# Patient Record
Sex: Female | Born: 1945
Health system: Southern US, Community
[De-identification: ages and names within clinical notes are randomized; demographics above are authoritative.]

## PROBLEM LIST (undated history)

## (undated) DIAGNOSIS — I209 Angina pectoris, unspecified: Secondary | ICD-10-CM

## (undated) DIAGNOSIS — K439 Ventral hernia without obstruction or gangrene: Secondary | ICD-10-CM

## (undated) DIAGNOSIS — M545 Low back pain, unspecified: Secondary | ICD-10-CM

## (undated) DIAGNOSIS — R0602 Shortness of breath: Secondary | ICD-10-CM

## (undated) DIAGNOSIS — R413 Other amnesia: Secondary | ICD-10-CM

## (undated) DIAGNOSIS — I219 Acute myocardial infarction, unspecified: Secondary | ICD-10-CM

## (undated) DIAGNOSIS — G8929 Other chronic pain: Secondary | ICD-10-CM

## (undated) DIAGNOSIS — C4491 Basal cell carcinoma of skin, unspecified: Secondary | ICD-10-CM

## (undated) DIAGNOSIS — R252 Cramp and spasm: Secondary | ICD-10-CM

## (undated) DIAGNOSIS — K219 Gastro-esophageal reflux disease without esophagitis: Secondary | ICD-10-CM

## (undated) DIAGNOSIS — Z9981 Dependence on supplemental oxygen: Secondary | ICD-10-CM

## (undated) DIAGNOSIS — R011 Cardiac murmur, unspecified: Secondary | ICD-10-CM

## (undated) DIAGNOSIS — Z9889 Other specified postprocedural states: Secondary | ICD-10-CM

## (undated) DIAGNOSIS — I639 Cerebral infarction, unspecified: Secondary | ICD-10-CM

## (undated) DIAGNOSIS — M1990 Unspecified osteoarthritis, unspecified site: Secondary | ICD-10-CM

## (undated) DIAGNOSIS — I739 Peripheral vascular disease, unspecified: Secondary | ICD-10-CM

## (undated) DIAGNOSIS — F40240 Claustrophobia: Secondary | ICD-10-CM

## (undated) DIAGNOSIS — I10 Essential (primary) hypertension: Secondary | ICD-10-CM

## (undated) DIAGNOSIS — G43009 Migraine without aura, not intractable, without status migrainosus: Secondary | ICD-10-CM

## (undated) DIAGNOSIS — R269 Unspecified abnormalities of gait and mobility: Secondary | ICD-10-CM

## (undated) DIAGNOSIS — E785 Hyperlipidemia, unspecified: Secondary | ICD-10-CM

## (undated) DIAGNOSIS — I447 Left bundle-branch block, unspecified: Secondary | ICD-10-CM

## (undated) DIAGNOSIS — J189 Pneumonia, unspecified organism: Secondary | ICD-10-CM

## (undated) DIAGNOSIS — I499 Cardiac arrhythmia, unspecified: Secondary | ICD-10-CM

## (undated) DIAGNOSIS — Z8719 Personal history of other diseases of the digestive system: Secondary | ICD-10-CM

## (undated) DIAGNOSIS — R1110 Vomiting, unspecified: Secondary | ICD-10-CM

## (undated) DIAGNOSIS — F32A Depression, unspecified: Secondary | ICD-10-CM

## (undated) DIAGNOSIS — G709 Myoneural disorder, unspecified: Secondary | ICD-10-CM

## (undated) DIAGNOSIS — E669 Obesity, unspecified: Secondary | ICD-10-CM

## (undated) DIAGNOSIS — R519 Headache, unspecified: Secondary | ICD-10-CM

## (undated) DIAGNOSIS — G43909 Migraine, unspecified, not intractable, without status migrainosus: Secondary | ICD-10-CM

## (undated) DIAGNOSIS — G4733 Obstructive sleep apnea (adult) (pediatric): Secondary | ICD-10-CM

## (undated) DIAGNOSIS — I2510 Atherosclerotic heart disease of native coronary artery without angina pectoris: Secondary | ICD-10-CM

## (undated) DIAGNOSIS — I509 Heart failure, unspecified: Secondary | ICD-10-CM

## (undated) DIAGNOSIS — F419 Anxiety disorder, unspecified: Secondary | ICD-10-CM

## (undated) DIAGNOSIS — N189 Chronic kidney disease, unspecified: Secondary | ICD-10-CM

## (undated) DIAGNOSIS — K920 Hematemesis: Secondary | ICD-10-CM

## (undated) DIAGNOSIS — F329 Major depressive disorder, single episode, unspecified: Secondary | ICD-10-CM

## (undated) DIAGNOSIS — E119 Type 2 diabetes mellitus without complications: Secondary | ICD-10-CM

## (undated) DIAGNOSIS — R112 Nausea with vomiting, unspecified: Secondary | ICD-10-CM

## (undated) DIAGNOSIS — C439 Malignant melanoma of skin, unspecified: Secondary | ICD-10-CM

## (undated) DIAGNOSIS — R51 Headache: Secondary | ICD-10-CM

## (undated) DIAGNOSIS — D649 Anemia, unspecified: Secondary | ICD-10-CM

## (undated) DIAGNOSIS — F5104 Psychophysiologic insomnia: Secondary | ICD-10-CM

## (undated) DIAGNOSIS — J42 Unspecified chronic bronchitis: Secondary | ICD-10-CM

## (undated) HISTORY — PX: TOTAL ABDOMINAL HYSTERECTOMY: SHX209

## (undated) HISTORY — DX: Migraine without aura, not intractable, without status migrainosus: G43.009

## (undated) HISTORY — DX: Vomiting, unspecified: R11.10

## (undated) HISTORY — PX: APPENDECTOMY: SHX54

## (undated) HISTORY — PX: CORONARY ARTERY BYPASS GRAFT: SHX141

## (undated) HISTORY — DX: Atherosclerotic heart disease of native coronary artery without angina pectoris: I25.10

## (undated) HISTORY — PX: OVARY SURGERY: SHX727

## (undated) HISTORY — DX: Other chronic pain: G89.29

## (undated) HISTORY — DX: Unspecified osteoarthritis, unspecified site: M19.90

## (undated) HISTORY — DX: Psychophysiologic insomnia: F51.04

## (undated) HISTORY — DX: Ventral hernia without obstruction or gangrene: K43.9

## (undated) HISTORY — PX: FRACTURE SURGERY: SHX138

## (undated) HISTORY — DX: Gastro-esophageal reflux disease without esophagitis: K21.9

## (undated) HISTORY — DX: Hematemesis: K92.0

## (undated) HISTORY — DX: Cardiac arrhythmia, unspecified: I49.9

## (undated) HISTORY — PX: TIBIA FRACTURE SURGERY: SHX806

## (undated) HISTORY — DX: Heart failure, unspecified: I50.9

## (undated) HISTORY — DX: Hyperlipidemia, unspecified: E78.5

## (undated) HISTORY — DX: Peripheral vascular disease, unspecified: I73.9

## (undated) HISTORY — DX: Cerebral infarction, unspecified: I63.9

## (undated) HISTORY — PX: CORONARY ANGIOPLASTY WITH STENT PLACEMENT: SHX49

## (undated) HISTORY — DX: Obesity, unspecified: E66.9

## (undated) HISTORY — PX: ANKLE FRACTURE SURGERY: SHX122

## (undated) HISTORY — DX: Acute myocardial infarction, unspecified: I21.9

## (undated) HISTORY — DX: Essential (primary) hypertension: I10

## (undated) HISTORY — PX: TOOTH EXTRACTION: SUR596

## (undated) HISTORY — DX: Low back pain: M54.5

## (undated) HISTORY — DX: Unspecified abnormalities of gait and mobility: R26.9

## (undated) HISTORY — DX: Anemia, unspecified: D64.9

## (undated) HISTORY — DX: Other amnesia: R41.3

## (undated) HISTORY — DX: Obstructive sleep apnea (adult) (pediatric): G47.33

---

## 1982-02-01 HISTORY — PX: GASTRIC RESTRICTION SURGERY: SHX653

## 1999-04-06 ENCOUNTER — Inpatient Hospital Stay (HOSPITAL_COMMUNITY): Admission: RE | Admit: 1999-04-06 | Discharge: 1999-04-14 | Payer: Self-pay | Admitting: Family Medicine

## 1999-04-06 ENCOUNTER — Encounter: Payer: Self-pay | Admitting: Cardiology

## 1999-04-29 ENCOUNTER — Encounter: Admission: RE | Admit: 1999-04-29 | Discharge: 1999-07-28 | Payer: Self-pay | Admitting: Cardiology

## 1999-05-05 ENCOUNTER — Encounter (HOSPITAL_COMMUNITY): Admission: RE | Admit: 1999-05-05 | Discharge: 1999-05-22 | Payer: Self-pay | Admitting: Cardiology

## 1999-06-03 ENCOUNTER — Encounter: Payer: Self-pay | Admitting: Cardiology

## 1999-06-03 ENCOUNTER — Ambulatory Visit (HOSPITAL_COMMUNITY): Admission: RE | Admit: 1999-06-03 | Discharge: 1999-06-04 | Payer: Self-pay | Admitting: Cardiology

## 1999-06-09 ENCOUNTER — Ambulatory Visit (HOSPITAL_COMMUNITY): Admission: RE | Admit: 1999-06-09 | Discharge: 1999-06-10 | Payer: Self-pay | Admitting: Cardiology

## 1999-11-20 ENCOUNTER — Encounter: Payer: Self-pay | Admitting: Cardiology

## 1999-11-20 ENCOUNTER — Ambulatory Visit (HOSPITAL_COMMUNITY): Admission: RE | Admit: 1999-11-20 | Discharge: 1999-11-20 | Payer: Self-pay | Admitting: Cardiology

## 2000-01-06 ENCOUNTER — Ambulatory Visit (HOSPITAL_COMMUNITY): Admission: RE | Admit: 2000-01-06 | Discharge: 2000-01-07 | Payer: Self-pay | Admitting: Cardiology

## 2000-02-23 ENCOUNTER — Inpatient Hospital Stay (HOSPITAL_COMMUNITY): Admission: EM | Admit: 2000-02-23 | Discharge: 2000-02-27 | Payer: Self-pay | Admitting: Emergency Medicine

## 2000-02-23 ENCOUNTER — Encounter: Payer: Self-pay | Admitting: Emergency Medicine

## 2000-06-30 ENCOUNTER — Ambulatory Visit (HOSPITAL_COMMUNITY): Admission: RE | Admit: 2000-06-30 | Discharge: 2000-07-01 | Payer: Self-pay | Admitting: Cardiology

## 2000-06-30 ENCOUNTER — Encounter: Payer: Self-pay | Admitting: Cardiology

## 2000-10-02 HISTORY — PX: ROOT CANAL: SHX2363

## 2002-02-01 HISTORY — PX: CHOLECYSTECTOMY OPEN: SUR202

## 2002-02-01 HISTORY — PX: HERNIA REPAIR: SHX51

## 2002-03-28 ENCOUNTER — Ambulatory Visit (HOSPITAL_COMMUNITY): Admission: RE | Admit: 2002-03-28 | Discharge: 2002-03-28 | Payer: Self-pay | Admitting: Cardiology

## 2002-03-28 ENCOUNTER — Encounter: Payer: Self-pay | Admitting: Cardiology

## 2002-04-23 ENCOUNTER — Encounter: Payer: Self-pay | Admitting: Cardiology

## 2002-04-23 ENCOUNTER — Ambulatory Visit (HOSPITAL_COMMUNITY): Admission: RE | Admit: 2002-04-23 | Discharge: 2002-04-23 | Payer: Self-pay | Admitting: Cardiology

## 2002-06-21 ENCOUNTER — Encounter: Payer: Self-pay | Admitting: Family Medicine

## 2002-06-21 ENCOUNTER — Encounter: Admission: RE | Admit: 2002-06-21 | Discharge: 2002-06-21 | Payer: Self-pay | Admitting: Family Medicine

## 2002-06-28 ENCOUNTER — Encounter: Admission: RE | Admit: 2002-06-28 | Discharge: 2002-06-28 | Payer: Self-pay | Admitting: Family Medicine

## 2002-06-28 ENCOUNTER — Encounter: Payer: Self-pay | Admitting: Family Medicine

## 2002-07-24 ENCOUNTER — Encounter: Payer: Self-pay | Admitting: Surgery

## 2002-08-01 ENCOUNTER — Encounter (INDEPENDENT_AMBULATORY_CARE_PROVIDER_SITE_OTHER): Payer: Self-pay | Admitting: Specialist

## 2002-08-03 ENCOUNTER — Inpatient Hospital Stay (HOSPITAL_COMMUNITY): Admission: RE | Admit: 2002-08-03 | Discharge: 2002-08-05 | Payer: Self-pay | Admitting: Surgery

## 2002-08-10 ENCOUNTER — Inpatient Hospital Stay (HOSPITAL_COMMUNITY): Admission: EM | Admit: 2002-08-10 | Discharge: 2002-08-12 | Payer: Self-pay | Admitting: Emergency Medicine

## 2002-08-10 ENCOUNTER — Encounter: Payer: Self-pay | Admitting: Surgery

## 2002-08-11 ENCOUNTER — Encounter: Payer: Self-pay | Admitting: General Surgery

## 2002-10-10 ENCOUNTER — Encounter: Payer: Self-pay | Admitting: Surgery

## 2002-10-10 ENCOUNTER — Encounter: Admission: RE | Admit: 2002-10-10 | Discharge: 2002-10-10 | Payer: Self-pay | Admitting: Surgery

## 2002-10-26 ENCOUNTER — Encounter: Payer: Self-pay | Admitting: Family Medicine

## 2002-10-26 ENCOUNTER — Encounter: Admission: RE | Admit: 2002-10-26 | Discharge: 2002-10-26 | Payer: Self-pay | Admitting: Family Medicine

## 2003-04-10 ENCOUNTER — Ambulatory Visit (HOSPITAL_COMMUNITY): Admission: RE | Admit: 2003-04-10 | Discharge: 2003-04-11 | Payer: Self-pay | Admitting: Cardiology

## 2003-05-03 HISTORY — PX: OTHER SURGICAL HISTORY: SHX169

## 2003-05-10 ENCOUNTER — Inpatient Hospital Stay (HOSPITAL_COMMUNITY): Admission: EM | Admit: 2003-05-10 | Discharge: 2003-05-16 | Payer: Self-pay | Admitting: Emergency Medicine

## 2003-08-27 ENCOUNTER — Encounter (INDEPENDENT_AMBULATORY_CARE_PROVIDER_SITE_OTHER): Payer: Self-pay | Admitting: *Deleted

## 2003-08-27 ENCOUNTER — Inpatient Hospital Stay (HOSPITAL_COMMUNITY): Admission: RE | Admit: 2003-08-27 | Discharge: 2003-09-03 | Payer: Self-pay | Admitting: Surgery

## 2003-11-11 ENCOUNTER — Encounter: Admission: RE | Admit: 2003-11-11 | Discharge: 2003-11-11 | Payer: Self-pay | Admitting: Family Medicine

## 2003-12-27 ENCOUNTER — Ambulatory Visit: Payer: Self-pay | Admitting: Internal Medicine

## 2004-01-01 ENCOUNTER — Ambulatory Visit (HOSPITAL_COMMUNITY): Admission: RE | Admit: 2004-01-01 | Discharge: 2004-01-01 | Payer: Self-pay | Admitting: Internal Medicine

## 2004-03-03 ENCOUNTER — Ambulatory Visit: Payer: Self-pay | Admitting: Internal Medicine

## 2004-03-04 ENCOUNTER — Ambulatory Visit: Payer: Self-pay | Admitting: Internal Medicine

## 2004-03-04 ENCOUNTER — Ambulatory Visit: Payer: Self-pay | Admitting: Cardiology

## 2004-03-18 ENCOUNTER — Ambulatory Visit: Payer: Self-pay | Admitting: Internal Medicine

## 2004-03-31 ENCOUNTER — Ambulatory Visit: Payer: Self-pay | Admitting: *Deleted

## 2004-04-13 ENCOUNTER — Encounter (HOSPITAL_COMMUNITY): Admission: RE | Admit: 2004-04-13 | Discharge: 2004-07-12 | Payer: Self-pay | Admitting: Cardiology

## 2004-04-21 ENCOUNTER — Ambulatory Visit: Payer: Self-pay | Admitting: Cardiology

## 2004-05-01 ENCOUNTER — Ambulatory Visit: Payer: Self-pay | Admitting: Cardiology

## 2004-05-05 ENCOUNTER — Ambulatory Visit (HOSPITAL_COMMUNITY): Admission: RE | Admit: 2004-05-05 | Discharge: 2004-05-05 | Payer: Self-pay | Admitting: Cardiology

## 2004-05-05 ENCOUNTER — Ambulatory Visit: Payer: Self-pay | Admitting: Cardiology

## 2004-05-06 ENCOUNTER — Ambulatory Visit: Payer: Self-pay | Admitting: Internal Medicine

## 2004-05-15 ENCOUNTER — Ambulatory Visit (HOSPITAL_COMMUNITY): Admission: RE | Admit: 2004-05-15 | Discharge: 2004-05-15 | Payer: Self-pay | Admitting: Internal Medicine

## 2004-05-20 ENCOUNTER — Ambulatory Visit: Payer: Self-pay | Admitting: Cardiology

## 2004-07-03 ENCOUNTER — Ambulatory Visit: Payer: Self-pay | Admitting: Cardiology

## 2004-07-13 ENCOUNTER — Encounter (HOSPITAL_COMMUNITY): Admission: RE | Admit: 2004-07-13 | Discharge: 2004-09-01 | Payer: Self-pay | Admitting: Cardiology

## 2004-08-05 ENCOUNTER — Ambulatory Visit: Payer: Self-pay | Admitting: Internal Medicine

## 2004-08-13 ENCOUNTER — Ambulatory Visit: Payer: Self-pay | Admitting: Internal Medicine

## 2004-08-19 ENCOUNTER — Ambulatory Visit: Payer: Self-pay | Admitting: Cardiology

## 2004-08-25 ENCOUNTER — Ambulatory Visit: Payer: Self-pay | Admitting: Pulmonary Disease

## 2004-09-17 ENCOUNTER — Ambulatory Visit: Payer: Self-pay | Admitting: Cardiology

## 2004-09-22 ENCOUNTER — Ambulatory Visit: Payer: Self-pay | Admitting: Pulmonary Disease

## 2004-11-19 ENCOUNTER — Ambulatory Visit: Payer: Self-pay | Admitting: Cardiology

## 2005-01-11 ENCOUNTER — Ambulatory Visit: Payer: Self-pay | Admitting: Cardiology

## 2005-01-21 ENCOUNTER — Inpatient Hospital Stay (HOSPITAL_COMMUNITY): Admission: AD | Admit: 2005-01-21 | Discharge: 2005-01-30 | Payer: Self-pay | Admitting: Cardiology

## 2005-01-21 ENCOUNTER — Ambulatory Visit: Payer: Self-pay | Admitting: Cardiology

## 2005-01-26 ENCOUNTER — Ambulatory Visit: Payer: Self-pay | Admitting: Cardiology

## 2005-01-27 ENCOUNTER — Ambulatory Visit: Payer: Self-pay | Admitting: Cardiology

## 2005-01-27 ENCOUNTER — Encounter: Payer: Self-pay | Admitting: Cardiology

## 2005-02-04 ENCOUNTER — Ambulatory Visit: Payer: Self-pay

## 2005-02-18 ENCOUNTER — Ambulatory Visit: Payer: Self-pay | Admitting: Internal Medicine

## 2005-02-25 ENCOUNTER — Ambulatory Visit: Payer: Self-pay | Admitting: Cardiology

## 2005-03-02 ENCOUNTER — Ambulatory Visit: Payer: Self-pay | Admitting: Cardiology

## 2005-03-29 ENCOUNTER — Ambulatory Visit: Payer: Self-pay | Admitting: Cardiology

## 2005-05-25 ENCOUNTER — Ambulatory Visit: Payer: Self-pay | Admitting: Internal Medicine

## 2005-06-30 ENCOUNTER — Ambulatory Visit: Payer: Self-pay | Admitting: Internal Medicine

## 2005-08-31 ENCOUNTER — Ambulatory Visit: Payer: Self-pay | Admitting: Cardiology

## 2005-11-15 ENCOUNTER — Ambulatory Visit: Payer: Self-pay | Admitting: Cardiology

## 2005-12-03 ENCOUNTER — Ambulatory Visit: Payer: Self-pay | Admitting: Cardiology

## 2006-01-17 ENCOUNTER — Ambulatory Visit: Payer: Self-pay | Admitting: Cardiology

## 2006-03-02 ENCOUNTER — Encounter: Admission: RE | Admit: 2006-03-02 | Discharge: 2006-03-02 | Payer: Self-pay | Admitting: Family Medicine

## 2006-03-04 ENCOUNTER — Ambulatory Visit: Payer: Self-pay | Admitting: Cardiology

## 2006-06-07 ENCOUNTER — Ambulatory Visit: Payer: Self-pay | Admitting: Cardiology

## 2006-09-29 ENCOUNTER — Ambulatory Visit: Payer: Self-pay | Admitting: Cardiology

## 2006-11-24 ENCOUNTER — Ambulatory Visit: Payer: Self-pay | Admitting: Cardiology

## 2006-12-14 ENCOUNTER — Ambulatory Visit: Payer: Self-pay | Admitting: Cardiology

## 2006-12-19 ENCOUNTER — Ambulatory Visit: Payer: Self-pay | Admitting: Cardiology

## 2006-12-19 LAB — CONVERTED CEMR LAB
BUN: 19 mg/dL (ref 6–23)
Basophils Absolute: 0 10*3/uL (ref 0.0–0.1)
Basophils Relative: 0.4 % (ref 0.0–1.0)
CO2: 28 meq/L (ref 19–32)
Calcium: 9.7 mg/dL (ref 8.4–10.5)
Chloride: 103 meq/L (ref 96–112)
Creatinine, Ser: 0.7 mg/dL (ref 0.4–1.2)
Eosinophils Absolute: 0.1 10*3/uL (ref 0.0–0.6)
Eosinophils Relative: 1.5 % (ref 0.0–5.0)
GFR calc Af Amer: 110 mL/min
GFR calc non Af Amer: 91 mL/min
Glucose, Bld: 209 mg/dL — ABNORMAL HIGH (ref 70–99)
HCT: 44.2 % (ref 36.0–46.0)
Hemoglobin: 15.7 g/dL — ABNORMAL HIGH (ref 12.0–15.0)
INR: 0.9 (ref 0.8–1.0)
Lymphocytes Relative: 26.8 % (ref 12.0–46.0)
MCHC: 35.4 g/dL (ref 30.0–36.0)
MCV: 92.8 fL (ref 78.0–100.0)
Monocytes Absolute: 0.5 10*3/uL (ref 0.2–0.7)
Monocytes Relative: 5.9 % (ref 3.0–11.0)
Neutro Abs: 5.8 10*3/uL (ref 1.4–7.7)
Neutrophils Relative %: 65.4 % (ref 43.0–77.0)
Platelets: 299 10*3/uL (ref 150–400)
Potassium: 3.7 meq/L (ref 3.5–5.1)
Prothrombin Time: 11.4 s (ref 10.9–13.3)
RBC: 4.76 M/uL (ref 3.87–5.11)
RDW: 12.3 % (ref 11.5–14.6)
Sodium: 140 meq/L (ref 135–145)
WBC: 8.8 10*3/uL (ref 4.5–10.5)
aPTT: 22.8 s (ref 21.7–29.8)

## 2006-12-21 ENCOUNTER — Ambulatory Visit (HOSPITAL_COMMUNITY): Admission: RE | Admit: 2006-12-21 | Discharge: 2006-12-21 | Payer: Self-pay | Admitting: Cardiology

## 2006-12-21 ENCOUNTER — Ambulatory Visit: Payer: Self-pay | Admitting: Cardiology

## 2007-01-09 ENCOUNTER — Ambulatory Visit: Payer: Self-pay | Admitting: Cardiology

## 2007-01-16 DIAGNOSIS — I2510 Atherosclerotic heart disease of native coronary artery without angina pectoris: Secondary | ICD-10-CM | POA: Insufficient documentation

## 2007-01-16 DIAGNOSIS — K219 Gastro-esophageal reflux disease without esophagitis: Secondary | ICD-10-CM | POA: Insufficient documentation

## 2007-01-16 DIAGNOSIS — G4733 Obstructive sleep apnea (adult) (pediatric): Secondary | ICD-10-CM | POA: Insufficient documentation

## 2007-01-16 DIAGNOSIS — J45909 Unspecified asthma, uncomplicated: Secondary | ICD-10-CM | POA: Insufficient documentation

## 2007-01-16 DIAGNOSIS — E118 Type 2 diabetes mellitus with unspecified complications: Secondary | ICD-10-CM | POA: Insufficient documentation

## 2007-01-16 DIAGNOSIS — K439 Ventral hernia without obstruction or gangrene: Secondary | ICD-10-CM | POA: Insufficient documentation

## 2007-01-16 DIAGNOSIS — I10 Essential (primary) hypertension: Secondary | ICD-10-CM | POA: Insufficient documentation

## 2007-01-16 DIAGNOSIS — E785 Hyperlipidemia, unspecified: Secondary | ICD-10-CM | POA: Insufficient documentation

## 2007-01-16 DIAGNOSIS — E119 Type 2 diabetes mellitus without complications: Secondary | ICD-10-CM

## 2007-01-17 ENCOUNTER — Ambulatory Visit: Payer: Self-pay | Admitting: Pulmonary Disease

## 2007-02-27 ENCOUNTER — Ambulatory Visit: Payer: Self-pay | Admitting: Cardiology

## 2007-05-23 ENCOUNTER — Ambulatory Visit: Payer: Self-pay | Admitting: Cardiology

## 2007-07-26 ENCOUNTER — Ambulatory Visit: Payer: Self-pay | Admitting: Cardiology

## 2007-10-24 DIAGNOSIS — R1115 Cyclical vomiting syndrome unrelated to migraine: Secondary | ICD-10-CM | POA: Insufficient documentation

## 2007-10-24 DIAGNOSIS — E559 Vitamin D deficiency, unspecified: Secondary | ICD-10-CM | POA: Insufficient documentation

## 2007-10-24 DIAGNOSIS — M1990 Unspecified osteoarthritis, unspecified site: Secondary | ICD-10-CM | POA: Insufficient documentation

## 2007-10-24 DIAGNOSIS — Z87898 Personal history of other specified conditions: Secondary | ICD-10-CM | POA: Insufficient documentation

## 2007-10-24 DIAGNOSIS — I252 Old myocardial infarction: Secondary | ICD-10-CM | POA: Insufficient documentation

## 2007-10-25 ENCOUNTER — Ambulatory Visit: Payer: Self-pay | Admitting: Internal Medicine

## 2007-10-30 ENCOUNTER — Ambulatory Visit: Payer: Self-pay | Admitting: Cardiology

## 2007-10-30 ENCOUNTER — Ambulatory Visit: Payer: Self-pay

## 2007-11-22 ENCOUNTER — Encounter: Payer: Self-pay | Admitting: Internal Medicine

## 2007-11-22 ENCOUNTER — Ambulatory Visit: Payer: Self-pay | Admitting: Internal Medicine

## 2007-11-23 ENCOUNTER — Encounter: Payer: Self-pay | Admitting: Internal Medicine

## 2007-11-24 ENCOUNTER — Ambulatory Visit (HOSPITAL_COMMUNITY): Admission: RE | Admit: 2007-11-24 | Discharge: 2007-11-24 | Payer: Self-pay | Admitting: Internal Medicine

## 2007-12-01 ENCOUNTER — Encounter: Payer: Self-pay | Admitting: Internal Medicine

## 2008-01-05 ENCOUNTER — Encounter: Payer: Self-pay | Admitting: Internal Medicine

## 2008-01-17 ENCOUNTER — Ambulatory Visit (HOSPITAL_COMMUNITY): Admission: RE | Admit: 2008-01-17 | Discharge: 2008-01-17 | Payer: Self-pay | Admitting: Surgery

## 2008-01-24 ENCOUNTER — Ambulatory Visit: Payer: Self-pay | Admitting: Cardiology

## 2008-05-28 ENCOUNTER — Encounter: Payer: Self-pay | Admitting: Cardiology

## 2008-07-03 ENCOUNTER — Ambulatory Visit: Payer: Self-pay | Admitting: Cardiology

## 2008-07-12 ENCOUNTER — Ambulatory Visit: Payer: Self-pay

## 2008-07-12 ENCOUNTER — Encounter: Payer: Self-pay | Admitting: Cardiology

## 2008-07-19 ENCOUNTER — Telehealth: Payer: Self-pay | Admitting: Cardiology

## 2008-08-27 ENCOUNTER — Encounter: Payer: Self-pay | Admitting: Cardiology

## 2008-09-17 ENCOUNTER — Encounter: Admission: RE | Admit: 2008-09-17 | Discharge: 2008-09-17 | Payer: Self-pay | Admitting: Surgery

## 2008-10-09 ENCOUNTER — Ambulatory Visit: Payer: Self-pay | Admitting: Cardiology

## 2008-10-09 DIAGNOSIS — M6289 Other specified disorders of muscle: Secondary | ICD-10-CM | POA: Insufficient documentation

## 2008-10-09 DIAGNOSIS — M629 Disorder of muscle, unspecified: Secondary | ICD-10-CM

## 2008-10-30 ENCOUNTER — Ambulatory Visit: Payer: Self-pay | Admitting: Cardiology

## 2008-12-11 ENCOUNTER — Telehealth (INDEPENDENT_AMBULATORY_CARE_PROVIDER_SITE_OTHER): Payer: Self-pay | Admitting: *Deleted

## 2008-12-17 ENCOUNTER — Encounter: Payer: Self-pay | Admitting: Cardiovascular Disease

## 2008-12-18 ENCOUNTER — Ambulatory Visit: Payer: Self-pay

## 2008-12-18 ENCOUNTER — Encounter: Payer: Self-pay | Admitting: Cardiovascular Disease

## 2008-12-24 ENCOUNTER — Ambulatory Visit: Payer: Self-pay | Admitting: Cardiology

## 2009-03-06 ENCOUNTER — Encounter: Payer: Self-pay | Admitting: Cardiology

## 2009-03-12 ENCOUNTER — Telehealth: Payer: Self-pay | Admitting: Cardiology

## 2009-03-31 ENCOUNTER — Ambulatory Visit: Payer: Self-pay | Admitting: Cardiology

## 2009-05-07 ENCOUNTER — Ambulatory Visit: Payer: Self-pay | Admitting: Cardiology

## 2009-06-02 ENCOUNTER — Encounter: Payer: Self-pay | Admitting: Cardiology

## 2009-06-26 ENCOUNTER — Ambulatory Visit: Payer: Self-pay | Admitting: Cardiology

## 2009-09-02 ENCOUNTER — Encounter: Payer: Self-pay | Admitting: Cardiology

## 2009-10-29 ENCOUNTER — Ambulatory Visit: Payer: Self-pay | Admitting: Cardiology

## 2009-11-03 ENCOUNTER — Encounter: Payer: Self-pay | Admitting: Cardiology

## 2009-11-04 ENCOUNTER — Encounter: Admission: RE | Admit: 2009-11-04 | Discharge: 2009-11-04 | Payer: Self-pay | Admitting: Cardiology

## 2009-11-04 ENCOUNTER — Ambulatory Visit: Payer: Self-pay | Admitting: Cardiology

## 2009-11-05 LAB — CONVERTED CEMR LAB
BUN: 19 mg/dL (ref 6–23)
Basophils Absolute: 0 10*3/uL (ref 0.0–0.1)
Basophils Relative: 0.6 % (ref 0.0–3.0)
CO2: 28 meq/L (ref 19–32)
Calcium: 9.6 mg/dL (ref 8.4–10.5)
Chloride: 105 meq/L (ref 96–112)
Creatinine, Ser: 0.6 mg/dL (ref 0.4–1.2)
Eosinophils Absolute: 0.3 10*3/uL (ref 0.0–0.7)
Eosinophils Relative: 4.3 % (ref 0.0–5.0)
GFR calc non Af Amer: 116.62 mL/min (ref >60)
Glucose, Bld: 201 mg/dL — ABNORMAL HIGH (ref 70–99)
HCT: 44.4 % (ref 36.0–46.0)
Hemoglobin: 15.4 g/dL — ABNORMAL HIGH (ref 12.0–15.0)
INR: 0.9 (ref 0.8–1.0)
Lymphocytes Relative: 30.3 % (ref 12.0–46.0)
Lymphs Abs: 2.2 10*3/uL (ref 0.7–4.0)
MCHC: 34.6 g/dL (ref 30.0–36.0)
MCV: 94.3 fL (ref 78.0–100.0)
Monocytes Absolute: 0.5 10*3/uL (ref 0.1–1.0)
Monocytes Relative: 7.1 % (ref 3.0–12.0)
Neutro Abs: 4.3 10*3/uL (ref 1.4–7.7)
Neutrophils Relative %: 57.7 % (ref 43.0–77.0)
Platelets: 231 10*3/uL (ref 150.0–400.0)
Potassium: 4.8 meq/L (ref 3.5–5.1)
Prothrombin Time: 9.8 s (ref 9.7–11.8)
RBC: 4.71 M/uL (ref 3.87–5.11)
RDW: 13 % (ref 11.5–14.6)
Sodium: 141 meq/L (ref 135–145)
WBC: 7.4 10*3/uL (ref 4.5–10.5)

## 2009-11-10 ENCOUNTER — Inpatient Hospital Stay (HOSPITAL_BASED_OUTPATIENT_CLINIC_OR_DEPARTMENT_OTHER): Admission: RE | Admit: 2009-11-10 | Discharge: 2009-11-10 | Payer: Self-pay | Admitting: Cardiology

## 2009-11-10 ENCOUNTER — Ambulatory Visit: Payer: Self-pay | Admitting: Cardiology

## 2009-11-14 ENCOUNTER — Ambulatory Visit: Payer: Self-pay | Admitting: Cardiology

## 2009-11-20 ENCOUNTER — Encounter: Payer: Self-pay | Admitting: Cardiology

## 2009-11-24 ENCOUNTER — Ambulatory Visit: Payer: Self-pay | Admitting: Cardiology

## 2009-11-27 ENCOUNTER — Telehealth: Payer: Self-pay | Admitting: Cardiology

## 2009-12-09 ENCOUNTER — Ambulatory Visit: Payer: Self-pay | Admitting: Surgery

## 2009-12-17 ENCOUNTER — Encounter: Payer: Self-pay | Admitting: Cardiology

## 2009-12-18 ENCOUNTER — Encounter: Payer: Self-pay | Admitting: Cardiology

## 2009-12-29 ENCOUNTER — Ambulatory Visit: Payer: Self-pay | Admitting: Cardiology

## 2010-01-01 DIAGNOSIS — E669 Obesity, unspecified: Secondary | ICD-10-CM

## 2010-01-01 HISTORY — DX: Obesity, unspecified: E66.9

## 2010-01-23 ENCOUNTER — Encounter: Payer: Self-pay | Admitting: Surgery

## 2010-01-23 ENCOUNTER — Telehealth (INDEPENDENT_AMBULATORY_CARE_PROVIDER_SITE_OTHER): Payer: Self-pay | Admitting: *Deleted

## 2010-01-28 ENCOUNTER — Inpatient Hospital Stay (HOSPITAL_COMMUNITY)
Admission: RE | Admit: 2010-01-28 | Discharge: 2010-02-04 | Payer: Self-pay | Source: Home / Self Care | Attending: Surgery | Admitting: Surgery

## 2010-02-04 LAB — GLUCOSE, CAPILLARY
Glucose-Capillary: 90 mg/dL (ref 70–99)
Glucose-Capillary: 118 mg/dL — ABNORMAL HIGH (ref 70–99)

## 2010-02-23 ENCOUNTER — Encounter: Payer: Self-pay | Admitting: Cardiology

## 2010-02-23 ENCOUNTER — Encounter
Admission: RE | Admit: 2010-02-23 | Discharge: 2010-02-23 | Payer: Self-pay | Source: Home / Self Care | Attending: Surgery | Admitting: Surgery

## 2010-02-23 ENCOUNTER — Ambulatory Visit
Admission: RE | Admit: 2010-02-23 | Discharge: 2010-02-23 | Payer: Self-pay | Source: Home / Self Care | Attending: Cardiology | Admitting: Cardiology

## 2010-02-23 ENCOUNTER — Ambulatory Visit
Admission: RE | Admit: 2010-02-23 | Discharge: 2010-02-23 | Payer: Self-pay | Source: Home / Self Care | Attending: Surgery | Admitting: Surgery

## 2010-03-03 NOTE — Assessment & Plan Note (Signed)
Summary: eph   Visit Type:  Follow-up Primary Provider:  Derinda Late, M.D.  CC:  Post-cath.  History of Present Illness: Back in for follow up after cath procedure. Case done from radial. Results and images reviewed in detail.  SHe continues to have chest pain, and also problems with medications from a GI perspective.  Will review options with surgical team.  Not ideal for this, but with symptoms may be an approach.   Current Medications (verified): 1)  Calcium 1500 Mg  Tabs (Calcium Carbonate) .... Take 1 Tablet By Mouth Once A Day 2)  Minoxidil 10 Mg  Tabs (Minoxidil) .... Take 1 1/2 Two Times A Day 3)  Prevalite 4 Gm  Pack (Cholestyramine Light) .... Take 2-3 Scoops Two Times A Day 4)  Metoprolol Tartrate 100 Mg Tabs (Metoprolol Tartrate) .... Take One Tablet By Mouth Twice A Day 5)  Tylenol Extra Strength 500 Mg  Tabs (Acetaminophen) .... Use As Needed 6)  Xanax 0.5 Mg  Tabs (Alprazolam) .... Take 1 Tablet By Mouth Three Times A Day 7)  Cozaar 100 Mg  Tabs (Losartan Potassium) .... Take 1 Tablet By Mouth Once A Day 8)  Lipitor 40 Mg Tabs (Atorvastatin Calcium) .Marland Kitchen.. 1 Tablet By Mouth Once Daily 9)  Lasix 80 Mg  Tabs (Furosemide) .... Take 1 Tablet By Mouth Two Times A Day As Needed 10)  Lantus 100 Unit/ml  Soln (Insulin Glargine) .Marland Kitchen.. 100 Units Daily 11)  Bayer Low Strength 81 Mg  Tbec (Aspirin) .... Take 1 Tablet By Mouth Once A Day 12)  Lisinopril 20 Mg  Tabs (Lisinopril) .... Take 1 Tablet By Mouth Two Times A Day 13)  Plavix 75 Mg  Tabs (Clopidogrel Bisulfate) .... Take 1 Tablet By Mouth Once A Day 14)  Multivitamins   Tabs (Multiple Vitamin) .... Take 1 Tablet By Mouth Once A Day 15)  Vitamin B-12 1000 Mcg  Tabs (Cyanocobalamin) .... Take 1 Tablet By Mouth Once A Day 16)  Albuterol 90 Mcg/act  Aers (Albuterol) .... Inhale Two Puffs Every Four To Six Hours As Needed 17)  Ambien 10 Mg  Tabs (Zolpidem Tartrate) .... Take 1 Tab By Mouth At Bedtime As Needed 18)  Flovent Hfa 110  Mcg/act  Aero (Fluticasone Propionate  Hfa) .... Inhale Two Puffs Two Times A Day As Needed 19)  Hydrocodone-Acetaminophen 5-500 Mg  Tabs (Hydrocodone-Acetaminophen) .... Use As Needed 20)  Mobic 7.5 Mg  Tabs (Meloxicam) .... Take 1 Tablet By Mouth Two Times A Day As Needed 21)  Migquin 325-100-65mg  .... Take Two Tabs At The Onset of A Migraine and Then One Every Hour Thereafter 22)  Clonidine Hcl 0.3 Mg Tabs (Clonidine Hcl) .... Take 1 Tablet By Mouth Three Times A Day 23)  Metolazone 5 Mg Tabs (Metolazone) .... Take 1 Tablet By Mouth Once A Day 24)  Nitroglycerin 0.4 Mg Subl (Nitroglycerin) .... One Tablet Under Tongue Every 5 Minutes As Needed For Chest Pain---May Repeat Times Three 25)  Vitamin B Complex-C   Caps (B Complex-C) .... Take 1 Capsule By Mouth Once A Day 26)  Nitro-Bid 2 % Oint (Nitroglycerin) .Marland Kitchen.. 1-2 Inches To Skin Every 6 Hours 27)  Ranitidine Hcl 150 Mg Tabs (Ranitidine Hcl) .... Take 1 Tablet By Mouth Two Times A Day 28)  Advair Diskus 500-50 Mcg/dose Aepb (Fluticasone-Salmeterol) .... One Puff Every 12 Hours 29)  Vitamin D (Ergocalciferol) 50000 Unit Caps (Ergocalciferol) .... Twice A Week 30)  Zofran 4 Mg Tabs (Ondansetron Hcl) .... Two Times  A Day As Needed  Allergies: 1)  ! Penicillin 2)  ! Erythromycin 3)  Amoxicillin (Amoxicillin)  Vital Signs:  Patient profile:   65 year old female Height:      63 inches Weight:      242.75 pounds BMI:     43.16 Pulse rate:   80 / minute Pulse rhythm:   regular Resp:     18 per minute BP sitting:   220 / 100  (right arm) Cuff size:   large  Vitals Entered By: Sidney Ace (November 14, 2009 2:47 PM)  Physical Exam  General:  Well developed, well nourished, in no acute distress. Head:  normocephalic and atraumatic Eyes:  PERRLA/EOM intact; conjunctiva and lids normal. Lungs:  Clear bilaterally to auscultation and percussion. Heart:  Paradoxical S2.   Normal S1. Extremities:  No clubbing or cyanosis.  L radial with  minimal puffiness, good pulses and refill.    EKG  Procedure date:  11/14/2009  Findings:      NSR.  LBBB.  PACs  Cardiac Cath  Procedure date:  11/10/2009  Findings:      ANGIOGRAPHIC DATA: 1. Ventriculography was done in the RAO projection.  Overall ejection     fraction was estimated around 50%.  There is an inferobasal     akinetic segment as previously noted. 2. There is moderate calcification of the coronaries. 3. The left main is free of critical disease. 4. The LAD courses to the apex.  There is a major diagonal branch and     at the major diagonal branch is about 50% irregular plaque in the     LAD itself.  This would be graded no more than 60.  There is     diffuse plaque distally with about 50% narrowing.  The major     diagonal branch has an 80% and 70% narrowing and is a modest size     vessel. 5. The circumflex provides a large marginal branch that has about 40%     leading into a previously stented area.  The stent itself has less     than about 30% narrowing distally.  There are two large branches.     The most superior of which has about 70% narrowing.  There is some     diffuse plaque in the AV circumflex which is a relatively small     caliber vessel with about 50% narrowing. 6. The right coronary artery is totally occluded proximally prior to     the stent site.  The distal vessel fills by collateralization from     the LAD.   CONCLUSION: 1. Preserved overall left ventricular function with an inferobasal     akinetic segment. 2. Total occlusion of right coronary artery as previously noted. 3. Modest plaque in the left anterior descending artery, moderately     severe plaque involving the ostium of modest size diagonal branch,     and no evidence of restenosis in the previously placed circumflex     stent.    Impression & Recommendations:  Problem # 1:  CORONARY ARTERY DISEASE (ICD-414.00) Long discussion with patient.  Stent is patent, but diffuse  disease.  Not critical.  Not ideal for CABG, and LAD is not severe.  Symptoms likely fueled by uncontrolled HTN.  See extensive workup.  Will have surgical team review for possible options.  Discussed with patient at length. Her updated medication list for this problem includes:    Metoprolol Tartrate 100  Mg Tabs (Metoprolol tartrate) .Marland Kitchen... Take one tablet by mouth twice a day    Bayer Low Strength 81 Mg Tbec (Aspirin) .Marland Kitchen... Take 1 tablet by mouth once a day    Lisinopril 20 Mg Tabs (Lisinopril) .Marland Kitchen... Take 1 tablet by mouth two times a day    Plavix 75 Mg Tabs (Clopidogrel bisulfate) .Marland Kitchen... Take 1 tablet by mouth once a day    Nitroglycerin 0.4 Mg Subl (Nitroglycerin) ..... One tablet under tongue every 5 minutes as needed for chest pain---may repeat times three    Nitro-bid 2 % Oint (Nitroglycerin) .Marland Kitchen... 1-2 inches to skin every 6 hours  Problem # 2:  HYPERTENSION (ICD-401.9) See prior discussions.  May be candidate for other procedures in near future.  Her updated medication list for this problem includes:    Minoxidil 10 Mg Tabs (Minoxidil) .Marland Kitchen... Take 1 1/2 two times a day    Metoprolol Tartrate 100 Mg Tabs (Metoprolol tartrate) .Marland Kitchen... Take one tablet by mouth twice a day    Cozaar 100 Mg Tabs (Losartan potassium) .Marland Kitchen... Take 1 tablet by mouth once a day    Lasix 80 Mg Tabs (Furosemide) .Marland Kitchen... Take 1 tablet by mouth two times a day as needed    Bayer Low Strength 81 Mg Tbec (Aspirin) .Marland Kitchen... Take 1 tablet by mouth once a day    Lisinopril 20 Mg Tabs (Lisinopril) .Marland Kitchen... Take 1 tablet by mouth two times a day    Clonidine Hcl 0.3 Mg Tabs (Clonidine hcl) .Marland Kitchen... Take 1 tablet by mouth three times a day    Metolazone 5 Mg Tabs (Metolazone) .Marland Kitchen... Take 1 tablet by mouth once a day  Patient Instructions: 1)  Your physician recommends that you schedule a follow-up appointment in: 2 WEEKS 2)  Your physician recommends that you continue on your current medications as directed. Please refer to the Current  Medication list given to you today. Prescriptions: NITROGLYCERIN 0.4 MG SUBL (NITROGLYCERIN) One tablet under tongue every 5 minutes as needed for chest pain---may repeat times three  #25 x 2   Entered by:   Theodosia Quay, RN, BSN   Authorized by:   Wadie Lessen, MD, [facility]   Signed by:   Theodosia Quay, RN, BSN on 11/14/2009   Method used:   Electronically to        Agency (retail)       [address] [place], [place]  [address]       Ph: [phone]       Fax: [phone]   RxID:   [id number]

## 2010-03-03 NOTE — Letter (Signed)
Summary: [facility] Patient Note  [facility] Patient Note   Imported By: Sallee Provencal 08/05/2009 12:25:04  _____________________________________________________________________  External Attachment:    Type:   Image     Comment:   External Document

## 2010-03-03 NOTE — Procedures (Signed)
Summary: EGD   EGD  Procedure date:  11/22/2007  Findings:      Location: [facility]    Patient Name: [name] [name], [name] [name]. [name]: [id number] Procedure Procedures: Panendoscopy (EGD) CPT: A5739879.    with biopsy(s)/brushing(s). CPT: T4586919.  Personnel: Endoscopist: Horice Carrero L. Olevia Perches, MD.  Exam Location: Exam performed in Outpatient Clinic. Outpatient  Patient Consent: Procedure, Alternatives, Risks and Benefits discussed, consent obtained, from patient. Consent was obtained by the RN.  Indications Symptoms: Nausea. Vomiting. Reflux symptoms for 6-10 yrs,  Surveillance of: 2006.  Comments: s/p banded gastroplasty for obesity in 1980, failure to loose weight, gastroparesis on Nuclear Medicine scan History  Current Medications: Patient is taking a non-steroidal medication. Patient is on an anticoagulant. Patient is not currently taking Coumadin.  Pre-Exam Physical: Performed Jun 30, 2005  Cardio-pulmonary exam, HEENT exam WNL. Abdominal exam abnormal. Extremity exam, Neurological exam, Mental status exam WNL.  Comments: Pt. history reviewed/updated, physical exam performed prior to initiation of sedation?yes Exam Exam Info: Maximum depth of insertion Duodenum, intended Duodenum. Vocal cords visualized. Gastric retroflexion performed. Images taken. ASA Classification: II. Tolerance: good.  Sedation Meds: Patient assessed and found to be appropriate for moderate (conscious) sedation. Fentanyl 100 mcg. given IV. Versed 9 mg. given IV. Cetacaine Spray 2 sprays given aerosolized.  Monitoring: BP and pulse monitoring done. Oximetry used. Supplemental O2 given  Findings - ESOPHAGEAL INFLAMMATION: suspected as a result of reflux. Severity is moderate, erosions present.  Length of inflammation: 1 cm. [place] Classification: Grade A. Biopsy/Esoph Inflamtn taken. ICD9: Esophagitis, Reflux: 530.11.  - PRIOR SURGERY: Cardia. banded gastroplasty, gastric pouch 4 cm,  ( 36-40cm) , has retained bile, see photos, gastroplasty  opening is very large easily admits the scope.  - Normal: Duodenal Bulb to Duodenal 2nd Portion.  Normal: Antrum. Comments: no retained food distal to the gastroplasty.   Assessment Abnormal examination, see findings above.  Diagnoses: 530.11: Esophagitis, Reflux.   Comments: s/p banded gastroplasty, loosened up, bile retention, reflyx esophagitis ( ? bile and acid) Events  Unplanned Intervention: No unplanned interventions were required.  Unplanned Events: There were no complications. Plans Medication(s): Await pathology. PPI: Omeprazole/Prilosec 20 mg QD, starting Nov 22, 2007  Carafate: 1gm BID, starting Nov 22, 2007   Comments: hold Mobic Disposition: After procedure patient sent to recovery. After recovery patient sent home.    cc: Derinda Late, MD  REPORT OF SURGICAL PATHOLOGY   Case #: [id number] Patient Name: [name] [name], [name] [name]. Office Chart Number:  N/A   [name]: [id number] Pathologist: H. Lynnell Chad, MD DOB/Age  [birth year] (Age: 65)    Gender: F Date Taken:  11/22/2007 Date Received: 11/22/2007   FINAL DIAGNOSIS   ***MICROSCOPIC EXAMINATION AND DIAGNOSIS***   GASTROESOPHAGEAL JUNCTION, BIOPSY:  GASTROESOPHAGEAL JUNCTION MUCOSA WITH MODERATE ACUTE AND CHRONIC INFLAMMATION CONSISTENT WITH GASTROESOPHAGEAL REFLUX.  NO INTESTINAL METAPLASIA, DYSPLASIA OR MALIGNANCY IDENTIFIED.   COMMENT An Alcian Blue stain is performed to determine the presence of intestinal metaplasia (goblet cell metaplasia). No intestinal metaplasia (goblet cell metaplasia) is identified with the Alcian Blue stain. The control stained appropriately.  Special stains (GMS and PAS) for fungal organisms will be performed and an addendum will follow.    (HCL:jy) 11/23/07   jy Date Reported:  11/23/2007     H. Lynnell Chad, MD *** Electronically Signed Out By [facility] ***   Clinical information R/O Barrett' s esophagus (jes)     specimen(s) obtained Gastroesophageal junction, biopsy   Gross Description Received in formalin are tan, soft  tissue fragments that are submitted in toto.  Number:  three Size:  0.3 to 0.5 cm One block   (MA:jes, 11/23/07)    jes/ Addendum   Addendum Comment Special stains, GMS and PAS for fungal organisms were performed. The stains are negative.  Controls stained appropriately.  (HCL:jy) 11/24/07   H. Lynnell Chad, MD   DATE REPORTED:11/24/2007     Signed by Lafayette Dragon MD on 11/24/2007 at 3:50 PM   November 23, 2007 [name]: [id number]    [facility] [address] [place], [place]  [address]    Dear Ms. Freelove,  I am pleased to inform you that the biopsies taken during your recent endoscopic examination did not show any evidence of cancer upon pathologic examination.The biopsy showed inflammation due to reflux  Additional information/recommendations:  __  _  _x_ Continue with the treatment plan as outlined on the day of your      exam.  I will call You with the results of Your small bowl x-ray.   Please call us if you are having persistent problems or have questions about your condition that have not been fully answered at this time.  Sincerely,  Lafayette Dragon MD  This letter has been electronically signed by your physician.   Signed by Lafayette Dragon MD on 11/23/2007 at 10:05 PM  ________________________________________________________________________ This report was created from the original endoscopy report, which was reviewed and signed by the above listed endoscopist.

## 2010-03-03 NOTE — Letter (Signed)
Summary: [facility]   Imported By: Jerrye Noble D'jimraou 01/23/2008 14:06:11  _____________________________________________________________________  External Attachment:    Type:   Image     Comment:   External Document

## 2010-03-03 NOTE — Assessment & Plan Note (Signed)
  [name] [name] MR#:  [id number] Page #    NAME:  [name] [name], [name] [name]  OFFICE NO:  [id number]  DATE:  05/25/05  DOB:  01-Jan-2046  HISTORY OF PRESENT ILLNESS:  The patient is a very nice 65 year old white female with cyclic vomiting and obesity, gastroesophageal reflux disease, status post remote gastric stapling in 1970 for morbid obesity. She has coronary artery disease followed by Dr. [name], status post recent percutaneous coronary intervention by Dr. [name] in December 2006. She is currently on Plavix and aspirin and will be on both medications probably for a long period of time because of the coated stent. We have done numerous upper endoscopies on her, last one on March 04, 2004.  She is here today to discuss colorectal screening. She has no family history of colon cancer, but is 65 years old and has never had colonoscopy. She denies any rectal bleeding. Her bowel habits are regular.  MEDICATIONS:  See medication list in the chart.  PHYSICAL EXAM:  Blood pressure 124/70, pulse 76, weight 262 pounds, which represents 20weight loss in last several months. This has been an intentional weight loss. She was alert, oriented. Lungs were clear to auscultation. COR with normal S1 and normal S2. Abdomen was obese, soft, with a skin ulcer in the left periumbilical area at the area where she gives herself insulin. She had ecchymosis in the other areas of insulin injections. This was a denuded superficial ulcer measuring about 5in diameter. It had no drainage, and it was not tender. Rest of the abdomen was unremarkable. There was no tenderness or mass. Rectal exam not done today. Extremities:  No edema.  IMPRESSION: 71.  A 65 year old white female. She is a good candidate for screening colonoscopy. She has never had one, but she is on Plavix and aspirin because of coated stents. She needs to stay on both anticoagulants through the procedure. 2.  Gastroesophageal reflux status post cyclic vomiting. 3.  Status  post remote gastric stapling procedure.  PLAN: 1.  Colonoscopy scheduled while on Plavix and aspirin. 2.  Zofran 4 mg, new prescription given for 3supply. 3.  Continue acid-suppressing agent for reflux. Omeprazole 20 mg, up to 40 mg a day. 4.  We gave her OsmoPrep because she is unable to tolerate large-volume liquids.      Lowella Bandy. Olevia Perches, M.D.  [id number] cc:  Derinda Late, MD  D:  05/25/05; T:  ; Job [id number]         Current Allergies: ! PENICILLIN ! ERYTHROMYCIN            ]

## 2010-03-03 NOTE — Assessment & Plan Note (Signed)
Summary: f85m  Medications Added COZAAR 100 MG  TABS (LOSARTAN POTASSIUM) Take 1 tablet by mouth once a day LANTUS 100 UNIT/ML  SOLN (INSULIN GLARGINE) 100 units daily CLONIDINE HCL 0.3 MG TABS (CLONIDINE HCL) Take 1 tablet by mouth three times a day METOLAZONE 5 MG TABS (METOLAZONE) Take 1 tablet by mouth once a day NITROGLYCERIN 0.4 MG SUBL (NITROGLYCERIN) One tablet under tongue every 5 minutes as needed for chest pain---may repeat times three VITAMIN D3 1000 UNIT CAPS (CHOLECALCIFEROL) Take 1 capsule by mouth once a day VITAMIN B COMPLEX-C   CAPS (B COMPLEX-C) Take 1 capsule by mouth once a day NITRO-BID 2 % OINT (NITROGLYCERIN) 1-2 inches to skin every 6 hours        Visit Type:  Follow-up Primary Provider:  Derinda Late, M.D.  CC:  chest pain.  History of Present Illness: No real change.  Still has some angina, and BP's remain difficult to control.  Dr. Lorrene Reid has been following her and they are using topical nitrates.  ?absorption issue.  That has been discussed with referral to [facility] and with Dr. Lorrene Reid.  No real change in anginal pattern.  See most recent cath data.  Current Medications (verified): 1)  Calcium 1500 Mg  Tabs (Calcium Carbonate) .... Take 1 Tablet By Mouth Once A Day 2)  Minoxidil 10 Mg  Tabs (Minoxidil) .... Take 1 Tablet By Mouth Three Times A Day 3)  Miacalcin 200 Unit/act Nasal Soln (Calcitonin ([place])) .... Inhale One Spray Alternating Nostrils Once A Day 4)  Prevalite 4 Gm  Pack (Cholestyramine Light) .... Take 2-3 Scoops Two Times A Day 5)  Lopressor 50 Mg  Tabs (Metoprolol Tartrate) .... Take 1 Tablet By Mouth Two Times A Day 6)  Tylenol Extra Strength 500 Mg  Tabs (Acetaminophen) .... Use As Needed 7)  Xanax 0.5 Mg  Tabs (Alprazolam) .... Take 1 Tablet By Mouth Three Times A Day 8)  Cozaar 100 Mg  Tabs (Losartan Potassium) .... Take 1 Tablet By Mouth Once A Day 9)  Lipitor 40 Mg Tabs (Atorvastatin Calcium) .Marland Kitchen.. 1 Tablet By Mouth Once  Daily 10)  Lasix 80 Mg  Tabs (Furosemide) .... Take 1 Tablet By Mouth Two Times A Day 11)  Lantus 100 Unit/ml  Soln (Insulin Glargine) .Marland Kitchen.. 100 Units Daily 12)  Bayer Low Strength 81 Mg  Tbec (Aspirin) .... Take 1 Tablet By Mouth Once A Day 13)  Lisinopril 20 Mg  Tabs (Lisinopril) .... Take 1 Tablet By Mouth Two Times A Day 14)  Omeprazole 20 Mg  Cpdr (Omeprazole) .... Take 1 Tablet By Mouth Two Times A Day 15)  Plavix 75 Mg  Tabs (Clopidogrel Bisulfate) .... Take 1 Tablet By Mouth Once A Day 16)  Multivitamins   Tabs (Multiple Vitamin) .... Take 1 Tablet By Mouth Once A Day 17)  Vitamin B-12 1000 Mcg  Tabs (Cyanocobalamin) .... Take 1 Tablet By Mouth Once A Day 18)  Albuterol 90 Mcg/act  Aers (Albuterol) .... Inhale Two Puffs Every Four To Six Hours As Needed 19)  Ambien 10 Mg  Tabs (Zolpidem Tartrate) .... Take 1 Tab By Mouth At Bedtime As Needed 20)  Flovent Hfa 110 Mcg/act  Aero (Fluticasone Propionate  Hfa) .... Inhale Two Puffs Two Times A Day As Needed 21)  Hydrocodone-Acetaminophen 5-500 Mg  Tabs (Hydrocodone-Acetaminophen) .... Use As Needed 22)  Mobic 7.5 Mg  Tabs (Meloxicam) .... Take 1 Tablet By Mouth Two Times A Day As Needed 23)  Migquin  325-100-65mg  .... Take Two Tabs At The Onset of A Migraine and Then One Every Hour Thereafter 24)  Clonidine Hcl 0.3 Mg Tabs (Clonidine Hcl) .... Take 1 Tablet By Mouth Three Times A Day 25)  Metolazone 5 Mg Tabs (Metolazone) .... Take 1 Tablet By Mouth Once A Day 26)  Nitroglycerin 0.4 Mg Subl (Nitroglycerin) .... One Tablet Under Tongue Every 5 Minutes As Needed For Chest Pain---May Repeat Times Three 27)  Vitamin D3 1000 Unit Caps (Cholecalciferol) .... Take 1 Capsule By Mouth Once A Day 28)  Vitamin B Complex-C   Caps (B Complex-C) .... Take 1 Capsule By Mouth Once A Day 29)  Nitro-Bid 2 % Oint (Nitroglycerin) .Marland Kitchen.. 1-2 Inches To Skin Every 6 Hours  Allergies: 1)  ! Penicillin 2)  ! Erythromycin  Vital Signs:  Patient profile:   65 year  old female Height:      63 inches Weight:      248.25 pounds BMI:     44.13 Pulse rate:   72 / minute Pulse rhythm:   regular Resp:     18 per minute BP sitting:   220 / 100  (left arm) Cuff size:   large  Vitals Entered By: Sidney Ace (July 03, 2008 4:47 PM)  Physical Exam  General:  Well developed, well nourished, in no acute distress. Head:  normocephalic and atraumatic Lungs:  Clear bilaterally to auscultation and percussion. Heart:  Normal S1 and S2. Positive S4.  No significant murmur. Extremities:  No clubbing or cyanosis.  No edema.   EKG  Procedure date:  07/03/2008  Findings:      NSR.  New LBBB since last office visit.  Impression & Recommendations:  Problem # 1:  CORONARY ARTERY DISEASE (ICD-414.00) No real change in anginal pattern.  Does have new LBBB from last office visit.  Will check an echo to evaluate LV function.   The following medications were removed from the medication list:    Nitroglycerin 0.4 Mg Subl (Nitroglycerin) ..... Use as needed Her updated medication list for this problem includes:    Lopressor 50 Mg Tabs (Metoprolol tartrate) .Marland Kitchen... Take 1 tablet by mouth two times a day    Bayer Low Strength 81 Mg Tbec (Aspirin) .Marland Kitchen... Take 1 tablet by mouth once a day    Lisinopril 20 Mg Tabs (Lisinopril) .Marland Kitchen... Take 1 tablet by mouth two times a day    Plavix 75 Mg Tabs (Clopidogrel bisulfate) .Marland Kitchen... Take 1 tablet by mouth once a day    Nitroglycerin 0.4 Mg Subl (Nitroglycerin) ..... One tablet under tongue every 5 minutes as needed for chest pain---may repeat times three    Nitro-bid 2 % Oint (Nitroglycerin) .Marland Kitchen... 1-2 inches to skin every 6 hours  Orders: EKG w/ Interpretation (93000) Echocardiogram (Echo)  Problem # 2:  HYPERTENSION (ICD-401.9) Followed by Dr. Lorrene Reid who has been helpful with this difficult hypertension issue.  Workup included visit to the [facility], and evaluation as noted. The following medications were removed  from the medication list:    Clonidine Hcl 0.2 Mg Tabs (Clonidine hcl) .Marland Kitchen... Take 1 tablet three times a day Her updated medication list for this problem includes:    Minoxidil 10 Mg Tabs (Minoxidil) .Marland Kitchen... Take 1 tablet by mouth three times a day    Lopressor 50 Mg Tabs (Metoprolol tartrate) .Marland Kitchen... Take 1 tablet by mouth two times a day    Cozaar 100 Mg Tabs (Losartan potassium) .Marland Kitchen... Take 1 tablet by mouth  once a day    Lasix 80 Mg Tabs (Furosemide) .Marland Kitchen... Take 1 tablet by mouth two times a day    Bayer Low Strength 81 Mg Tbec (Aspirin) .Marland Kitchen... Take 1 tablet by mouth once a day    Lisinopril 20 Mg Tabs (Lisinopril) .Marland Kitchen... Take 1 tablet by mouth two times a day    Clonidine Hcl 0.3 Mg Tabs (Clonidine hcl) .Marland Kitchen... Take 1 tablet by mouth three times a day    Metolazone 5 Mg Tabs (Metolazone) .Marland Kitchen... Take 1 tablet by mouth once a day  Problem # 3:  HYPERLIPIDEMIA (ICD-272.4) Followed by her primary.   Her updated medication list for this problem includes:    Prevalite 4 Gm Pack (Cholestyramine light) .Marland Kitchen... Take 2-3 scoops two times a day    Lipitor 40 Mg Tabs (Atorvastatin calcium) .Marland Kitchen... 1 tablet by mouth once daily  Patient Instructions: 1)  Your physician recommends that you schedule a follow-up appointment in: 3 MONTHS 2)  Your physician recommends that you continue on your current medications as directed. Please refer to the Current Medication list given to you today. 3)  Your physician has requested that you have an echocardiogram.  Echocardiography is a painless test that uses sound waves to create images of your heart. It provides your doctor with information about the size and shape of your heart and how well your heart's chambers and valves are working.  This procedure takes approximately one hour. There are no restrictions for this procedure.

## 2010-03-03 NOTE — Assessment & Plan Note (Signed)
Summary: STILL HAVING VOMITING ISSUES/FH   History of Present Illness Visit Type: follow up Primary GI MD: Delfin Edis MD Primary Provider: Derinda Late, M.D. Chief Complaint: N&V History of Present Illness:   65 year old white female with cyclic nausea and vomiting; status post banded gastroplasty in the 1980s for morbid obesity. She has a small gastric remnant measuring 4 cm as of her last upper endoscopy done in February 2006. The endoscopy showed a rather loose gastroplasty which was in the opening, partially undone. Over the years, the patient has failed to lose a significant amount of weight. From her  last appointment in April 2007 until today, she lost about 13 pounds. The patient has significant medical problems including coronary artery disease, high blood pressure, obesity and diabetes. She is status post cholecystectomy Her last colonoscopy in May 2007 was done on Plavix and aspirin and was normal. She had 2 prior gastric emptying scans which confirmed gastric varices. Her vomiting occurs 1 to 3 times a day.  She ends up with soreness substernally, burning and epigastric pain. The patient has taken Prilosec 20 mg twice a day with some relief of her symptoms.   GI Review of Systems    Reports acid reflux, nausea, and  vomiting.      Denies abdominal pain, belching, bloating, chest pain, dysphagia with liquids, dysphagia with solids, heartburn, loss of appetite, vomiting blood, weight loss, and  weight gain.      Reports diarrhea.     Denies anal fissure, black tarry stools, change in bowel habit, constipation, diverticulosis, fecal incontinence, heme positive stool, hemorrhoids, irritable bowel syndrome, jaundice, light color stool, liver problems, rectal bleeding, and  rectal pain.     Prior Medications Reviewed Using: List Brought by Patient  Updated Prior Medication List: * B COMPLEX Take 1 tablet by mouth once a day CALCIUM 1500 MG  TABS (CALCIUM CARBONATE) Take 1 tablet by  mouth once a day MINOXIDIL 10 MG  TABS (MINOXIDIL) take 1 tablet by mouth three times a day MIACALCIN 200 UNIT/ACT NASAL SOLN (CALCITONIN ([place])) inhale one spray alternating nostrils once a day PREVALITE 4 GM  PACK (CHOLESTYRAMINE LIGHT) take 2-3 scoops two times a day VITAMIN D 57846 UNIT  CAPS (ERGOCALCIFEROL) take once a week LOPRESSOR 50 MG  TABS (METOPROLOL TARTRATE) Take 1 tablet by mouth two times a day NITROGLYCERIN 0.4 MG  SUBL (NITROGLYCERIN) use as needed TYLENOL EXTRA STRENGTH 500 MG  TABS (ACETAMINOPHEN) use as needed XANAX 0.5 MG  TABS (ALPRAZOLAM) Take 1 tablet by mouth three times a day CLONIDINE HCL 0.2 MG  TABS (CLONIDINE HCL) Take 1 tablet three times a day COZAAR 100 MG  TABS (LOSARTAN POTASSIUM) Take 1 tablet by mouth two times a day LIPITOR 40 MG TABS (ATORVASTATIN CALCIUM) 1 tablet by mouth once daily LASIX 80 MG  TABS (FUROSEMIDE) Take 1 tablet by mouth two times a day LANTUS 100 UNIT/ML  SOLN (INSULIN GLARGINE) 70 units each morning BAYER LOW STRENGTH 81 MG  TBEC (ASPIRIN) Take 1 tablet by mouth once a day LISINOPRIL 20 MG  TABS (LISINOPRIL) Take 1 tablet by mouth two times a day OMEPRAZOLE 20 MG  CPDR (OMEPRAZOLE) Take 1 tablet by mouth two times a day PLAVIX 75 MG  TABS (CLOPIDOGREL BISULFATE) Take 1 tablet by mouth once a day MULTIVITAMINS   TABS (MULTIPLE VITAMIN) Take 1 tablet by mouth once a day VITAMIN B-12 1000 MCG  TABS (CYANOCOBALAMIN) Take 1 tablet by mouth once a day ALBUTEROL 90 MCG/ACT  AERS (ALBUTEROL) inhale two puffs every four to six hours as needed AMBIEN 10 MG  TABS (ZOLPIDEM TARTRATE) Take 1 tab by mouth at bedtime as needed FLOVENT HFA 110 MCG/ACT  AERO (FLUTICASONE PROPIONATE  HFA) inhale two puffs two times a day as needed HYDROCODONE-ACETAMINOPHEN 5-500 MG  TABS (HYDROCODONE-ACETAMINOPHEN) use as needed MOBIC 7.5 MG  TABS (MELOXICAM) Take 1 tablet by mouth two times a day as needed * MIGQUIN 325-100-65MG  take two tabs at the onset of a  migraine and then one every hour thereafter  Current Allergies (reviewed today): ! PENICILLIN ! ERYTHROMYCIN  Past Medical History:    Reviewed history from 10/24/2007 and no changes required:       Current Problems:        MIGRAINES, HX OF (ICD-V13.8)       OSTEOARTHRITIS (ICD-715.90)       PERSISTENT VOMITING (ICD-536.2)       OBSTRUCTIVE SLEEP APNEA (ICD-327.23)       ASTHMA (ICD-493.90)       Hx of VENTRAL HERNIA (ICD-553.20)       DM (ICD-250.00)       GERD (ICD-530.81)       OBESITY, MORBID (ICD-278.01)       HYPERLIPIDEMIA (ICD-272.4)       HYPERTENSION (ICD-401.9)       CORONARY ARTERY DISEASE (ICD-414.00)         Past Surgical History:    Reviewed history from 10/24/2007 and no changes required:       percutanerous coronary intervention       gastric stapling-1970       Ovarian tumor removal       right leg surgery- rod and 2 pins placed       Hysterectomy       hernia repair/lysis of adhesions       Angioplasty -multiple times       cholecystectomy   Family History:    Reviewed history from 10/24/2007 and no changes required:       Family History of Heart Disease: Father       Family History of Diabetes: paternal grandmother/aunt/grandfather       Uncles       No FH of Colon Cancer:  Social History:    Reviewed history from 10/24/2007 and no changes required:       Alcohol Use - no       Illicit Drug Use - no       Patient has never smoked.        Daily Caffeine Use   Risk Factors:  Tobacco use:  never Caffeine use:  2 drinks per day   Review of Systems       The patient complains of arthritis/joint pain, back pain, cough, fatigue, headaches-new, heart rhythm changes, shortness of breath, sleeping problems, swelling of feet/legs, and urination - excessive.     Vital Signs:  Patient Profile:   65 Years Old Female Height:     63.5 inches Weight:      249.25 pounds BMI:     43.62 BSA:     2.14 Pulse rate:   68 / minute Pulse rhythm:    regular BP sitting:   170 / 100  (left arm)  Vitals Entered By: Randye Lobo CMA (October 25, 2007 10:08 AM)                  Physical Exam  General:     morbidly obese, alert and oriented, no  cough, voice is normal. No hoarseness. Neck:     Supple; no masses or thyromegaly. Lungs:     Clear throughout to auscultation. Heart:     Regular rate and rhythm; no murmurs, rubs,  or bruits. Abdomen:     abdomen is obese with a large panniculus, normoactive bowel sounds, mild tenderness in the subxiphoid and epigastric area, liver edge at costal margin.    Impression & Recommendations:  Problem # 1:  PERSISTENT VOMITING (ICD-536.2) persistent vomiting due to several causes.  The most important one is a small gastric remnant due to banded gastroplasty in 1980s causing exhalation of the food and slow gastric emptying. This results in reflux of gastric contents into her esophagus. Some of the vomiting may be psychogenic as well because it occurs even when she doesn't eat. She might have developed an incompetent lower esophageal sphincter from continuous vomiting.  I have discussed with her the possibility of converting her banded gastroplasty by doing a lateral band procedure but I am not sure if it would technically be possible since she had an exploratory laparotomy and has a large scar in her abdomen.  I think this would make the laparoscopic approach somewhat risky or impossible. Orders: EGD (EGD)   Problem # 2:  OSTEOARTHRITIS (ICD-715.90) patient is taking Mobic for osteoarthritis which is almost certainly aggravating her gastritis and gastric pain.  Problem # 3:  DM (ICD-250.00) possible gastroparesis on the basis of viisceral neuropathy.  This may get worse as she gets older. Reglan has not been helpful and was discontinued recently because of possible side effects.   Patient Instructions: 1)  upper endoscopy to r/o Barrett's esophagus, to assess for esophagitis, gastritis  or NSAID related ulcers. 2)  Prilosec 20 mg q.a.m., Zegerid 40 mg p.o. q.h.s. samples given 3)  Carafate slurry 10 cc which is 1 g p.o. b.i.d. 4)  Antireflux measures 5)  Consider a surgical opinion for conversion of banded gastroplasty to lap band procedure 6)  Copy Sent To: Dr Derinda Late    Prescriptions: CARAFATE 1 GM/10ML SUSP (SUCRALFATE) Take 10 cc (2 teaspoons) by mouth two times a day  #12 cc x 1   Entered by:   Awilda Bill CMA   Authorized by:   Lafayette Dragon MD   Signed by:   Awilda Bill CMA on 10/25/2007   Method used:   Electronically to        [facility] (retail)       [address] [place], [place]  [address]       Ph: [phone]       Fax: [phone]   RxID:   [id number]  ]

## 2010-03-03 NOTE — Assessment & Plan Note (Signed)
Summary: per check otu/sf   Visit Type:  2 mo f/u Primary Provider:  Derinda Late, M.D.  CC:  pt states she had her flu shot last week..pt c/o sob..some edema.Marland Kitchenand some cp.  History of Present Illness: Conitnues to have some troubles.  Discussion with patient again today regarding situation.  Has had extensive workup, with followup regularly with Dr. Lorrene Reid, and also referral to [facility] for recommendations.  Concern regards absorption of meds, and yet cannot take transdermal meds very effectively.  Still has moderate limitation of exertion, with some clear symptoms.  Know RCA occlusion, but distal vessel is know to be very small.  She has been under alot of stress recently.  Her grandson has been in trouble with the law.  Strain on patient and their daughter.  BP were better at first, but still a problem.  Current Medications (verified): 1)  Calcium 1500 Mg  Tabs (Calcium Carbonate) .... Take 1 Tablet By Mouth Once A Day 2)  Minoxidil 10 Mg  Tabs (Minoxidil) .... Take 1 Tablet By Mouth Three Times A Day 3)  Prevalite 4 Gm  Pack (Cholestyramine Light) .... Take 2-3 Scoops Two Times A Day 4)  Metoprolol Tartrate 50 Mg Tabs (Metoprolol Tartrate) .... Take One and One-Half  Tablet By Mouth Twice A Day 5)  Tylenol Extra Strength 500 Mg  Tabs (Acetaminophen) .... Use As Needed 6)  Xanax 0.5 Mg  Tabs (Alprazolam) .... Take 1 Tablet By Mouth Three Times A Day 7)  Cozaar 100 Mg  Tabs (Losartan Potassium) .... Take 1 Tablet By Mouth Once A Day 8)  Lipitor 40 Mg Tabs (Atorvastatin Calcium) .Marland Kitchen.. 1 Tablet By Mouth Once Daily 9)  Lasix 80 Mg  Tabs (Furosemide) .... Take 1 Tablet By Mouth Two Times A Day 10)  Lantus 100 Unit/ml  Soln (Insulin Glargine) .Marland Kitchen.. 100 Units Daily 11)  Bayer Low Strength 81 Mg  Tbec (Aspirin) .... Take 1 Tablet By Mouth Once A Day 12)  Lisinopril 20 Mg  Tabs (Lisinopril) .... Take 1 Tablet By Mouth Two Times A Day 13)  Plavix 75 Mg  Tabs (Clopidogrel Bisulfate)  .... Take 1 Tablet By Mouth Once A Day 14)  Multivitamins   Tabs (Multiple Vitamin) .... Take 1 Tablet By Mouth Once A Day 15)  Vitamin B-12 1000 Mcg  Tabs (Cyanocobalamin) .... Take 1 Tablet By Mouth Once A Day 16)  Albuterol 90 Mcg/act  Aers (Albuterol) .... Inhale Two Puffs Every Four To Six Hours As Needed 17)  Ambien 10 Mg  Tabs (Zolpidem Tartrate) .... Take 1 Tab By Mouth At Bedtime As Needed 18)  Flovent Hfa 110 Mcg/act  Aero (Fluticasone Propionate  Hfa) .... Inhale Two Puffs Two Times A Day As Needed 19)  Hydrocodone-Acetaminophen 5-500 Mg  Tabs (Hydrocodone-Acetaminophen) .... Use As Needed 20)  Mobic 7.5 Mg  Tabs (Meloxicam) .... Take 1 Tablet By Mouth Two Times A Day As Needed 21)  Migquin 325-100-65mg  .... Take Two Tabs At The Onset of A Migraine and Then One Every Hour Thereafter 22)  Clonidine Hcl 0.3 Mg Tabs (Clonidine Hcl) .... Take 1 Tablet By Mouth Three Times A Day 23)  Metolazone 5 Mg Tabs (Metolazone) .... Take 1 Tablet By Mouth Once A Day 24)  Nitroglycerin 0.4 Mg Subl (Nitroglycerin) .... One Tablet Under Tongue Every 5 Minutes As Needed For Chest Pain---May Repeat Times Three 25)  Vitamin B Complex-C   Caps (B Complex-C) .... Take 1 Capsule By Mouth Once A  Day 26)  Nitro-Bid 2 % Oint (Nitroglycerin) .Marland Kitchen.. 1-2 Inches To Skin Every 6 Hours 27)  Ranitidine Hcl 150 Mg Tabs (Ranitidine Hcl) .... Take 1 Tablet By Mouth Two Times A Day 28)  Advair Diskus 500-50 Mcg/dose Aepb (Fluticasone-Salmeterol) .... One Puff Every 12 Hours 29)  Vitamin D (Ergocalciferol) 50000 Unit Caps (Ergocalciferol) .Marland Kitchen.. 1 Tab Weekly  Allergies: 1)  ! Penicillin 2)  ! Erythromycin 3)  Amoxicillin (Amoxicillin)  Vital Signs:  Patient profile:   65 year old female Height:      63 inches Weight:      247 pounds BMI:     43.91 Pulse rate:   74 / minute Pulse rhythm:   regular Resp:     18 per minute BP sitting:   220 / 100  (left arm) Cuff size:   large  Vitals Entered By: Julaine Hua, CMA  (December 24, 2008 4:53 PM)  Physical Exam  General:  Well developed, well nourished, in no acute distress. Lungs:  Clear bilaterally to auscultation and percussion. Heart:  Prominent S4 at this point, with paradoxical splitting of S2. Abdomen:  No  bruits. Extremities:  No clubbing or cyanosis. Neurologic:  Alert and oriented x 3.   EKG  Procedure date:  12/24/2008  Findings:      NSR.  LBBB  Impression & Recommendations:  Problem # 1:  HYPERTENSION (ICD-401.9) Very poor control despite very agressive regimen.  Has been followed and adjusted by Dr. Lorrene Reid.  Cardiac symptoms, per cath, likely from added stress of BP on known total RCA occlusion with collaterals.  However, need to monitor for changing symptom pattern. Her updated medication list for this problem includes:    Minoxidil 10 Mg Tabs (Minoxidil) .Marland Kitchen... Take 1 tablet by mouth three times a day    Metoprolol Tartrate 50 Mg Tabs (Metoprolol tartrate) .Marland Kitchen... Take one and one-half  tablet by mouth twice a day    Cozaar 100 Mg Tabs (Losartan potassium) .Marland Kitchen... Take 1 tablet by mouth once a day    Lasix 80 Mg Tabs (Furosemide) .Marland Kitchen... Take 1 tablet by mouth two times a day    Bayer Low Strength 81 Mg Tbec (Aspirin) .Marland Kitchen... Take 1 tablet by mouth once a day    Lisinopril 20 Mg Tabs (Lisinopril) .Marland Kitchen... Take 1 tablet by mouth two times a day    Clonidine Hcl 0.3 Mg Tabs (Clonidine hcl) .Marland Kitchen... Take 1 tablet by mouth three times a day    Metolazone 5 Mg Tabs (Metolazone) .Marland Kitchen... Take 1 tablet by mouth once a day  Problem # 2:  CORONARY ARTERY DISEASE (ICD-414.00) See above. BP control is the key here. Her updated medication list for this problem includes:    Metoprolol Tartrate 50 Mg Tabs (Metoprolol tartrate) .Marland Kitchen... Take one and one-half  tablet by mouth twice a day    Bayer Low Strength 81 Mg Tbec (Aspirin) .Marland Kitchen... Take 1 tablet by mouth once a day    Lisinopril 20 Mg Tabs (Lisinopril) .Marland Kitchen... Take 1 tablet by mouth two times a day    Plavix  75 Mg Tabs (Clopidogrel bisulfate) .Marland Kitchen... Take 1 tablet by mouth once a day    Nitroglycerin 0.4 Mg Subl (Nitroglycerin) ..... One tablet under tongue every 5 minutes as needed for chest pain---may repeat times three    Nitro-bid 2 % Oint (Nitroglycerin) .Marland Kitchen... 1-2 inches to skin every 6 hours  Orders: EKG w/ Interpretation (93000)  Problem # 3:  HYPERLIPIDEMIA (ICD-272.4) Being followed by Dr.  Blomgren. Her updated medication list for this problem includes:    Prevalite 4 Gm Pack (Cholestyramine light) .Marland Kitchen... Take 2-3 scoops two times a day    Lipitor 40 Mg Tabs (Atorvastatin calcium) .Marland Kitchen... 1 tablet by mouth once daily  Patient Instructions: 1)  Your physician recommends that you schedule a follow-up appointment in: 3 months. 2)  Needs close followup with Dr. Lorrene Reid for adjustment of BP meds.

## 2010-03-03 NOTE — Letter (Signed)
Summary: Patient [facility] Biopsy Results  [facility]  [place], [place] [address]   Phone: [phone]  Fax: [phone]        November 23, 2007 [name]: [id number]    [facility] [address] [place], [place]  [address]    Dear Ms. Hemenway,  I am pleased to inform you that the biopsies taken during your recent endoscopic examination did not show any evidence of cancer upon pathologic examination.The biopsy showed inflammation due to reflux  Additional information/recommendations:  __  _  _x_ Continue with the treatment plan as outlined on the day of your      exam.  I will call You with the results of Your small bowl x-ray.   Please call us if you are having persistent problems or have questions about your condition that have not been fully answered at this time.  Sincerely,  Lafayette Dragon MD  This letter has been electronically signed by your physician.

## 2010-03-03 NOTE — Assessment & Plan Note (Signed)
Summary: f56m   Visit Type:  Follow-up Primary Provider:  Derinda Late, M.D.  CC:  chest pain.  History of Present Illness: Patient just returned from beach, and looks like she had too much sun.  Patient has actually lost a bit of weight overall.  Patient has been focused on her diet.  Patient called Dr. Sandi Mariscal, as they would not renew Zofran.  They actually got Zofran approved.  They have approved them now for every day.  That has helped her keep her meds down.  Dealing with the nausea has helped keep medications down.   She says her body is strange as regards to response to medications.  She is still having alot of chest pain.  Several instances where her arms get heavy.  Her symptoms can last up to an hour, but most are fifteen minutes.  The chest pain is no better.   She cannot walk without angina.  About every hundred feet she has to stop and get her breath.   Current Medications (verified): 1)  Calcium 1500 Mg  Tabs (Calcium Carbonate) .... Take 1 Tablet By Mouth Once A Day 2)  Minoxidil 10 Mg  Tabs (Minoxidil) .... Take 1 1/2 Two Times A Day 3)  Prevalite 4 Gm  Pack (Cholestyramine Light) .... Take 2-3 Scoops Two Times A Day 4)  Metoprolol Tartrate 100 Mg Tabs (Metoprolol Tartrate) .... Take One Tablet By Mouth Twice A Day 5)  Tylenol Extra Strength 500 Mg  Tabs (Acetaminophen) .... Use As Needed 6)  Xanax 0.5 Mg  Tabs (Alprazolam) .... Take 1 Tablet By Mouth Three Times A Day 7)  Cozaar 100 Mg  Tabs (Losartan Potassium) .... Take 1 Tablet By Mouth Once A Day 8)  Lipitor 40 Mg Tabs (Atorvastatin Calcium) .Marland Kitchen.. 1 Tablet By Mouth Once Daily 9)  Lasix 80 Mg  Tabs (Furosemide) .... Take 1 Tablet By Mouth Two Times A Day As Needed 10)  Lantus 100 Unit/ml  Soln (Insulin Glargine) .Marland Kitchen.. 100 Units Daily 11)  Bayer Low Strength 81 Mg  Tbec (Aspirin) .... Take 1 Tablet By Mouth Once A Day 12)  Lisinopril 20 Mg  Tabs (Lisinopril) .... Take 1 Tablet By Mouth Two Times A Day 13)  Plavix 75 Mg   Tabs (Clopidogrel Bisulfate) .... Take 1 Tablet By Mouth Once A Day 14)  Multivitamins   Tabs (Multiple Vitamin) .... Take 1 Tablet By Mouth Once A Day 15)  Vitamin B-12 1000 Mcg  Tabs (Cyanocobalamin) .... Take 1 Tablet By Mouth Once A Day 16)  Albuterol 90 Mcg/act  Aers (Albuterol) .... Inhale Two Puffs Every Four To Six Hours As Needed 17)  Ambien 10 Mg  Tabs (Zolpidem Tartrate) .... Take 1 Tab By Mouth At Bedtime As Needed 18)  Flovent Hfa 110 Mcg/act  Aero (Fluticasone Propionate  Hfa) .... Inhale Two Puffs Two Times A Day As Needed 19)  Hydrocodone-Acetaminophen 5-500 Mg  Tabs (Hydrocodone-Acetaminophen) .... Use As Needed 20)  Mobic 7.5 Mg  Tabs (Meloxicam) .... Take 1 Tablet By Mouth Two Times A Day As Needed 21)  Migquin 325-100-65mg  .... Take Two Tabs At The Onset of A Migraine and Then One Every Hour Thereafter 22)  Clonidine Hcl 0.3 Mg Tabs (Clonidine Hcl) .... Take 1 Tablet By Mouth Three Times A Day 23)  Metolazone 5 Mg Tabs (Metolazone) .... Take 1 Tablet By Mouth Once A Day 24)  Nitroglycerin 0.4 Mg Subl (Nitroglycerin) .... One Tablet Under Tongue Every 5 Minutes As Needed For  Chest Pain---May Repeat Times Three 25)  Vitamin B Complex-C   Caps (B Complex-C) .... Take 1 Capsule By Mouth Once A Day 26)  Nitro-Bid 2 % Oint (Nitroglycerin) .Marland Kitchen.. 1-2 Inches To Skin Every 6 Hours 27)  Ranitidine Hcl 150 Mg Tabs (Ranitidine Hcl) .... Take 1 Tablet By Mouth Two Times A Day 28)  Advair Diskus 500-50 Mcg/dose Aepb (Fluticasone-Salmeterol) .... One Puff Every 12 Hours 29)  Vitamin D (Ergocalciferol) 50000 Unit Caps (Ergocalciferol) .... Twice A Week 30)  Zofran 4 Mg Tabs (Ondansetron Hcl) .... Two Times A Day As Needed  Allergies (verified): 1)  ! Penicillin 2)  ! Erythromycin 3)  Amoxicillin (Amoxicillin)  Past History:  Past Medical History: Last updated: 03/31/2008 Current Problems:  MIGRAINES, HX OF (ICD-V13.8) OSTEOARTHRITIS (ICD-715.90) PERSISTENT VOMITING  (ICD-536.2) OBSTRUCTIVE SLEEP APNEA (ICD-327.23) ASTHMA (ICD-493.90) Hx of VENTRAL HERNIA (ICD-553.20) DM (ICD-250.00) GERD (ICD-530.81) OBESITY, MORBID (ICD-278.01) HYPERLIPIDEMIA (ICD-272.4) HYPERTENSION (ICD-401.9), severe CORONARY ARTERY DISEASE (ICD-414.00)  Past Surgical History: Last updated: 03/21/2009 percutanerous coronary intervention gastric stapling-1970 Ovarian tumor removal right leg surgery- rod and 2 pins placed Hysterectomy hernia repair/lysis of adhesions Angioplasty -multiple times cholecystectomy  Family History: Last updated: 03/21/2009 Family History of Heart Disease: Father Family History of Diabetes: paternal grandmother/aunt/grandfather Uncles No FH of Colon Cancer:  Social History: Last updated: 03/21/2009 Alcohol Use - no Illicit Drug Use - no Patient has never smoked.  Daily Caffeine Use  Risk Factors: Caffeine Use: 2 (10/25/2007)  Risk Factors: Smoking Status: never (10/25/2007)  Vital Signs:  Patient profile:   65 year old female Height:      63 inches Weight:      248 pounds BMI:     44.09 Pulse rate:   67 / minute BP sitting:   152 / 88  (left arm) Cuff size:   large  Vitals Entered By: Mignon Pine, RMA (October 29, 2009 4:35 PM)  Physical Exam  General:  Well developed, well nourished, in no acute distress. Head:  normocephalic and atraumatic Eyes:  PERRLA/EOM intact; conjunctiva and lids normal. Ears:  TM's intact and clear with normal canals and hearing Lungs:  Clear bilaterally to auscultation and percussion. Heart:  PMI non displaced.  Normal S1 and S2.  No murmur. Pulses:  pulses normal in all 4 extremities Extremities:  No clubbing or cyanosis. Neurologic:  Alert and oriented x 3.   EKG  Procedure date:  10/29/2009  Findings:      NSR.  LVH.  Repolarization abnormality.   Impression & Recommendations:  Problem # 1:  CORONARY ARTERY DISEASE (ICD-414.00) cotinues to have worse pain than before.  Symptoms are about the same.   Last cath 2008.  Plan restudy from left radial approach.  Am worried about her situation, but suspect our findings will be the same.  Radial should be safer BP wise.  Her updated medication list for this problem includes:    Metoprolol Tartrate 100 Mg Tabs (Metoprolol tartrate) .Marland Kitchen... Take one tablet by mouth twice a day    Bayer Low Strength 81 Mg Tbec (Aspirin) .Marland Kitchen... Take 1 tablet by mouth once a day    Lisinopril 20 Mg Tabs (Lisinopril) .Marland Kitchen... Take 1 tablet by mouth two times a day    Plavix 75 Mg Tabs (Clopidogrel bisulfate) .Marland Kitchen... Take 1 tablet by mouth once a day    Nitroglycerin 0.4 Mg Subl (Nitroglycerin) ..... One tablet under tongue every 5 minutes as needed for chest pain---may repeat times three    Nitro-bid 2 %  Oint (Nitroglycerin) .Marland Kitchen... 1-2 inches to skin every 6 hours  Orders: EKG w/ Interpretation (93[address]) Cardiac Catheterization (Cardiac Cath)  Problem # 2:  HYPERTENSION (ICD-401.9)  very difficult to control.  Followed by Dr Lorrene Reid.. Her updated medication list for this problem includes:    Minoxidil 10 Mg Tabs (Minoxidil) .Marland Kitchen... Take 1 1/2 two times a day    Metoprolol Tartrate 100 Mg Tabs (Metoprolol tartrate) .Marland Kitchen... Take one tablet by mouth twice a day    Cozaar 100 Mg Tabs (Losartan potassium) .Marland Kitchen... Take 1 tablet by mouth once a day    Lasix 80 Mg Tabs (Furosemide) .Marland Kitchen... Take 1 tablet by mouth two times a day as needed    Bayer Low Strength 81 Mg Tbec (Aspirin) .Marland Kitchen... Take 1 tablet by mouth once a day    Lisinopril 20 Mg Tabs (Lisinopril) .Marland Kitchen... Take 1 tablet by mouth two times a day    Clonidine Hcl 0.3 Mg Tabs (Clonidine hcl) .Marland Kitchen... Take 1 tablet by mouth three times a day    Metolazone 5 Mg Tabs (Metolazone) .Marland Kitchen... Take 1 tablet by mouth once a day  Orders: EKG w/ Interpretation (93000) Cardiac Catheterization (Cardiac Cath)  Problem # 3:  HYPERLIPIDEMIA (ICD-272.4)  followed by Dr. Sandi Mariscal. Her updated medication list for this problem  includes:    Prevalite 4 Gm Pack (Cholestyramine light) .Marland Kitchen... Take 2-3 scoops two times a day    Lipitor 40 Mg Tabs (Atorvastatin calcium) .Marland Kitchen... 1 tablet by mouth once daily  Orders: EKG w/ Interpretation (93[address]) Cardiac Catheterization (Cardiac Cath)  Patient Instructions: 1)  Your physician recommends that you schedule a follow-up appointment in: 3 WEEKS 2)  Your physician has requested that you have a cardiac catheterization.  Cardiac catheterization is used to diagnose and/or treat various heart conditions. Doctors may recommend this procedure for a number of different reasons. The most common reason is to evaluate chest pain. Chest pain can be a symptom of coronary artery disease (CAD), and cardiac catheterization can show whether plaque is narrowing or blocking your heart's arteries. This procedure is also used to evaluate the valves, as well as measure the blood flow and oxygen levels in different parts of your heart.  For further information please visit [email or web address].  Please follow instruction sheet, as given.

## 2010-03-03 NOTE — Letter (Signed)
Summary: Medication List  Medication List   Imported By: Jamelle Haring 12/17/2008 10:32:17  _____________________________________________________________________  External Attachment:    Type:   Image     Comment:   External Document

## 2010-03-03 NOTE — Progress Notes (Signed)
Summary: Dental Work  Electrical engineer from Patient Call back at [employer] [phone]   Caller: Patient Summary of Call: PT HAVING DENTAL WORK  NEXT WEEK WANT TO KNOW IF [place] APPT. Initial call taken by: Delsa Sale,  March 12, 2009 1:34 PM  Follow-up for Phone Call        Spoke with pt. Patient states she wants to make sure wheather she needs to take antibiotics prior a  dental work. Pt states she has mild mitral valve leakage. Pt. said  she does not need antibiotics for  dental cleaning.. Her dentist wants to know if she needs antibiotics for a dental crown. I let pt. know she may not need to take the medication. I will send message to MD and his nurse to make sure.Okay with pt. Carollee Sires, RN, BSN  March 12, 2009 2:43 PM   Additional Follow-up for Phone Call Additional follow up Details #1::        Per Dr [name] the pt does not require SBE. Theodosia Quay, RN, BSN  March 13, 2009 6:08 PM  Pt aware that she does not require SBE.  Additional Follow-up by: Theodosia Quay, RN, BSN,  March 14, 2009 9:34 AM

## 2010-03-03 NOTE — Assessment & Plan Note (Signed)
Summary: 3 month rov   Visit Type:  3 months follow up Primary Provider:  Derinda Late, M.D.  CC:  Chest pain- Irregular heart beat not all the time.  History of Present Illness: In for followup.  Dr. Lorrene Reid and I discussed her case in detail earlier today.  Various options were discussed.  The patient is not wearing her NTG paste, and it does improved BP.  She puts in on later.  She does not want additional gastric bypass.  We considered readmitting her and reissuing BP meds in the hospital to assess response.  She has exertional chest discomfort which has gotten a little worse recently, but none at rest.  Still vomits at least once per day.  Various meds were reviewed.    Current Medications (verified): 1)  Calcium 1500 Mg  Tabs (Calcium Carbonate) .... Take 1 Tablet By Mouth Once A Day 2)  Minoxidil 10 Mg  Tabs (Minoxidil) .... Take 1 1/2 Two Times A Day 3)  Prevalite 4 Gm  Pack (Cholestyramine Light) .... Take 2-3 Scoops Two Times A Day 4)  Metoprolol Tartrate 50 Mg Tabs (Metoprolol Tartrate) .... Take One and One-Half  Tablet By Mouth Twice A Day 5)  Tylenol Extra Strength 500 Mg  Tabs (Acetaminophen) .... Use As Needed 6)  Xanax 0.5 Mg  Tabs (Alprazolam) .... Take 1 Tablet By Mouth Three Times A Day 7)  Cozaar 100 Mg  Tabs (Losartan Potassium) .... Take 1 Tablet By Mouth Once A Day 8)  Lipitor 40 Mg Tabs (Atorvastatin Calcium) .Marland Kitchen.. 1 Tablet By Mouth Once Daily 9)  Lasix 80 Mg  Tabs (Furosemide) .... Take 1 Tablet By Mouth Two Times A Day 10)  Lantus 100 Unit/ml  Soln (Insulin Glargine) .Marland Kitchen.. 100 Units Daily 11)  Bayer Low Strength 81 Mg  Tbec (Aspirin) .... Take 1 Tablet By Mouth Once A Day 12)  Lisinopril 20 Mg  Tabs (Lisinopril) .... Take 1 Tablet By Mouth Two Times A Day 13)  Plavix 75 Mg  Tabs (Clopidogrel Bisulfate) .... Take 1 Tablet By Mouth Once A Day 14)  Multivitamins   Tabs (Multiple Vitamin) .... Take 1 Tablet By Mouth Once A Day 15)  Vitamin B-12 1000 Mcg  Tabs  (Cyanocobalamin) .... Take 1 Tablet By Mouth Once A Day 16)  Albuterol 90 Mcg/act  Aers (Albuterol) .... Inhale Two Puffs Every Four To Six Hours As Needed 17)  Ambien 10 Mg  Tabs (Zolpidem Tartrate) .... Take 1 Tab By Mouth At Bedtime As Needed 18)  Flovent Hfa 110 Mcg/act  Aero (Fluticasone Propionate  Hfa) .... Inhale Two Puffs Two Times A Day As Needed 19)  Hydrocodone-Acetaminophen 5-500 Mg  Tabs (Hydrocodone-Acetaminophen) .... Use As Needed 20)  Mobic 7.5 Mg  Tabs (Meloxicam) .... Take 1 Tablet By Mouth Two Times A Day As Needed 21)  Migquin 325-100-65mg  .... Take Two Tabs At The Onset of A Migraine and Then One Every Hour Thereafter 22)  Clonidine Hcl 0.3 Mg Tabs (Clonidine Hcl) .... Take 1 Tablet By Mouth Three Times A Day 23)  Metolazone 5 Mg Tabs (Metolazone) .... Take 1 Tablet By Mouth Once A Day 24)  Nitroglycerin 0.4 Mg Subl (Nitroglycerin) .... One Tablet Under Tongue Every 5 Minutes As Needed For Chest Pain---May Repeat Times Three 25)  Vitamin B Complex-C   Caps (B Complex-C) .... Take 1 Capsule By Mouth Once A Day 26)  Nitro-Bid 2 % Oint (Nitroglycerin) .Marland Kitchen.. 1-2 Inches To Skin Every 6 Hours 27)  Ranitidine Hcl 150 Mg Tabs (Ranitidine Hcl) .... Take 1 Tablet By Mouth Two Times A Day 28)  Advair Diskus 500-50 Mcg/dose Aepb (Fluticasone-Salmeterol) .... One Puff Every 12 Hours 29)  Vitamin D (Ergocalciferol) 50000 Unit Caps (Ergocalciferol) .Marland Kitchen.. 1 Tab Weekly  Allergies: 1)  ! Penicillin 2)  ! Erythromycin 3)  Amoxicillin (Amoxicillin)  Vital Signs:  Patient profile:   65 year old female Height:      63 inches Weight:      248.75 pounds BMI:     44.22 Pulse rate:   69 / minute Pulse rhythm:   regular Resp:     18 per minute BP sitting:   210 / 100  (left arm) Cuff size:   large  Vitals Entered By: Sidney Ace (March 31, 2009 12:19 PM)  Physical Exam  General:  Well developed, well nourished, in no acute distress. Head:  normocephalic and atraumatic Neck:  No  JVP Lungs:  Clear bilaterally to auscultation and percussion. Heart:  Paradoxical spit of S2.   Normal S1.  No def murmur. Abdomen:  Bowel sounds positive; abdomen soft and non-tender without masses, organomegaly, or hernias noted. No hepatosplenomegaly. Extremities:  Trace edema. Neurologic:  Alert and oriented x 3.   EKG  Procedure date:  03/31/2009  Findings:      NSR.  LBBB.    Impression & Recommendations:  Problem # 1:  CORONARY ARTERY DISEASE (ICD-414.00) Has total RCA, and exertional symptoms.  LIkely worse in part due to lack of reasonable BP control.   Will continue to review options. Her updated medication list for this problem includes:    Metoprolol Tartrate 50 Mg Tabs (Metoprolol tartrate) .Marland Kitchen... Take one and one-half  tablet by mouth twice a day    Bayer Low Strength 81 Mg Tbec (Aspirin) .Marland Kitchen... Take 1 tablet by mouth once a day    Lisinopril 20 Mg Tabs (Lisinopril) .Marland Kitchen... Take 1 tablet by mouth two times a day    Plavix 75 Mg Tabs (Clopidogrel bisulfate) .Marland Kitchen... Take 1 tablet by mouth once a day    Nitroglycerin 0.4 Mg Subl (Nitroglycerin) ..... One tablet under tongue every 5 minutes as needed for chest pain---may repeat times three    Nitro-bid 2 % Oint (Nitroglycerin) .Marland Kitchen... 1-2 inches to skin every 6 hours  Orders: EKG w/ Interpretation (93[address])  Problem # 2:  HYPERTENSION (ICD-401.9) Uncontrolled on multiple meds.  ?readmit and reinstitute drug by drug--?absorption --has been seen by Dr. Lorrene Reid and [facility]pertension unit.  See prior workup.  ? consider renal denervation procedure if that works.  Reviewed with patient and discussed options available.  She will see me back in four weeks while we investigate options.   Her updated medication list for this problem includes:    Minoxidil 10 Mg Tabs (Minoxidil) .Marland Kitchen... Take 1 1/2 two times a day    Metoprolol Tartrate 50 Mg Tabs (Metoprolol tartrate) .Marland Kitchen... Take one and one-half  tablet by mouth twice a day    Cozaar 100 Mg  Tabs (Losartan potassium) .Marland Kitchen... Take 1 tablet by mouth once a day    Lasix 80 Mg Tabs (Furosemide) .Marland Kitchen... Take 1 tablet by mouth two times a day    Bayer Low Strength 81 Mg Tbec (Aspirin) .Marland Kitchen... Take 1 tablet by mouth once a day    Lisinopril 20 Mg Tabs (Lisinopril) .Marland Kitchen... Take 1 tablet by mouth two times a day    Clonidine Hcl 0.3 Mg Tabs (Clonidine hcl) .Marland Kitchen... Take 1 tablet by  mouth three times a day    Metolazone 5 Mg Tabs (Metolazone) .Marland Kitchen... Take 1 tablet by mouth once a day  Orders: EKG w/ Interpretation (93[address])  Problem # 3:  OBSTRUCTIVE SLEEP APNEA (ICD-327.23) See prior notes  Problem # 4:  HYPERLIPIDEMIA (ICD-272.4) Followed by Dr. Sandi Mariscal. Her updated medication list for this problem includes:    Prevalite 4 Gm Pack (Cholestyramine light) .Marland Kitchen... Take 2-3 scoops two times a day    Lipitor 40 Mg Tabs (Atorvastatin calcium) .Marland Kitchen... 1 tablet by mouth once daily  Orders: EKG w/ Interpretation (93000)  Patient Instructions: 1)  Your physician recommends that you schedule a follow-up appointment in: 1 MONTH 2)  Your physician recommends that you continue on your current medications as directed. Please refer to the Current Medication list given to you today.

## 2010-03-03 NOTE — Assessment & Plan Note (Signed)
Summary: f3m   Visit Type:  1 month follow up Primary Provider:  Derinda Late, M.D.  CC:  Chest pains and Sob.  History of Present Illness: She has been doing about the same. She thinks she id definitely no worse.  She thinks her BP is stress related, and she can feel it.  She can feel it on the inside.  Then the pressure is high.  SHe has this fear that something bad is going to happen otherwise.  Her BP is as low as 180/90 when she could keep meds down, and she tried to stop Mobic.  She is nauseated when she eats.  She is wearing the NItrobid.  She was not wearing it because it smelled bad.    Current Medications (verified): 1)  Calcium 1500 Mg  Tabs (Calcium Carbonate) .... Take 1 Tablet By Mouth Once A Day 2)  Minoxidil 10 Mg  Tabs (Minoxidil) .... Take 1 1/2 Two Times A Day 3)  Prevalite 4 Gm  Pack (Cholestyramine Light) .... Take 2-3 Scoops Two Times A Day 4)  Metoprolol Tartrate 50 Mg Tabs (Metoprolol Tartrate) .... Take One and One-Half  Tablet By Mouth Twice A Day 5)  Tylenol Extra Strength 500 Mg  Tabs (Acetaminophen) .... Use As Needed 6)  Xanax 0.5 Mg  Tabs (Alprazolam) .... Take 1 Tablet By Mouth Three Times A Day 7)  Cozaar 100 Mg  Tabs (Losartan Potassium) .... Take 1 Tablet By Mouth Once A Day 8)  Lipitor 40 Mg Tabs (Atorvastatin Calcium) .Marland Kitchen.. 1 Tablet By Mouth Once Daily 9)  Lasix 80 Mg  Tabs (Furosemide) .... Take 1 Tablet By Mouth Two Times A Day 10)  Lantus 100 Unit/ml  Soln (Insulin Glargine) .Marland Kitchen.. 100 Units Daily 11)  Bayer Low Strength 81 Mg  Tbec (Aspirin) .... Take 1 Tablet By Mouth Once A Day 12)  Lisinopril 20 Mg  Tabs (Lisinopril) .... Take 1 Tablet By Mouth Two Times A Day 13)  Plavix 75 Mg  Tabs (Clopidogrel Bisulfate) .... Take 1 Tablet By Mouth Once A Day 14)  Multivitamins   Tabs (Multiple Vitamin) .... Take 1 Tablet By Mouth Once A Day 15)  Vitamin B-12 1000 Mcg  Tabs (Cyanocobalamin) .... Take 1 Tablet By Mouth Once A Day 16)  Albuterol 90 Mcg/act  Aers  (Albuterol) .... Inhale Two Puffs Every Four To Six Hours As Needed 17)  Ambien 10 Mg  Tabs (Zolpidem Tartrate) .... Take 1 Tab By Mouth At Bedtime As Needed 18)  Flovent Hfa 110 Mcg/act  Aero (Fluticasone Propionate  Hfa) .... Inhale Two Puffs Two Times A Day As Needed 19)  Hydrocodone-Acetaminophen 5-500 Mg  Tabs (Hydrocodone-Acetaminophen) .... Use As Needed 20)  Mobic 7.5 Mg  Tabs (Meloxicam) .... Take 1 Tablet By Mouth Two Times A Day As Needed 21)  Migquin 325-100-65mg  .... Take Two Tabs At The Onset of A Migraine and Then One Every Hour Thereafter 22)  Clonidine Hcl 0.3 Mg Tabs (Clonidine Hcl) .... Take 1 Tablet By Mouth Three Times A Day 23)  Metolazone 5 Mg Tabs (Metolazone) .... Take 1 Tablet By Mouth Once A Day 24)  Nitroglycerin 0.4 Mg Subl (Nitroglycerin) .... One Tablet Under Tongue Every 5 Minutes As Needed For Chest Pain---May Repeat Times Three 25)  Vitamin B Complex-C   Caps (B Complex-C) .... Take 1 Capsule By Mouth Once A Day 26)  Nitro-Bid 2 % Oint (Nitroglycerin) .Marland Kitchen.. 1-2 Inches To Skin Every 6 Hours 27)  Ranitidine Hcl 150  Mg Tabs (Ranitidine Hcl) .... Take 1 Tablet By Mouth Two Times A Day 28)  Advair Diskus 500-50 Mcg/dose Aepb (Fluticasone-Salmeterol) .... One Puff Every 12 Hours 29)  Vitamin D (Ergocalciferol) 50000 Unit Caps (Ergocalciferol) .Marland Kitchen.. 1 Tab Weekly  Allergies: 1)  ! Penicillin 2)  ! Erythromycin 3)  Amoxicillin (Amoxicillin)  Vital Signs:  Patient profile:   65 year old female Height:      63 inches Weight:      248.50 pounds BMI:     44.18 Pulse rate:   74 / minute Pulse rhythm:   regular Resp:     18 per minute BP sitting:   220 / 104  (left arm) Cuff size:   large  Vitals Entered By: Sidney Ace (May 07, 2009 2:00 PM)  Physical Exam  General:  Well developed, well nourished, in no acute distress. Head:  normocephalic and atraumatic Eyes:  PERRLA/EOM intact; conjunctiva and lids normal. Lungs:  Clear bilaterally to auscultation and  percussion. Heart:  Non-displaced PMI, chest non-tender; regular rate and rhythm, S1, S2 without murmurs, rubs or gallops. Carotid upstroke normal, no bruit. Normal abdominal aortic size, no bruits.  Extremities:  No clubbing or cyanosis. Neurologic:  Alert and oriented x 3.   EKG  Procedure date:  05/07/2009  Findings:      NSR. LBBB.  Impression & Recommendations:  Problem # 1:  HYPERTENSION (ICD-401.9) She has asthma, so is not a candidate for carvedilol.  We will increase her metoprolol to 100mg  twice daily, and see if that wont improve.  We looked into RF ablation of renal arteries as option, but I am not convinced this is not without risk, and currently not availabe as a treatment paradigm.   Her updated medication list for this problem includes:    Minoxidil 10 Mg Tabs (Minoxidil) .Marland Kitchen... Take 1 1/2 two times a day    Metoprolol Tartrate 100 Mg Tabs (Metoprolol tartrate) .Marland Kitchen... Take one tablet by mouth twice a day    Cozaar 100 Mg Tabs (Losartan potassium) .Marland Kitchen... Take 1 tablet by mouth once a day    Lasix 80 Mg Tabs (Furosemide) .Marland Kitchen... Take 1 tablet by mouth two times a day    Bayer Low Strength 81 Mg Tbec (Aspirin) .Marland Kitchen... Take 1 tablet by mouth once a day    Lisinopril 20 Mg Tabs (Lisinopril) .Marland Kitchen... Take 1 tablet by mouth two times a day    Clonidine Hcl 0.3 Mg Tabs (Clonidine hcl) .Marland Kitchen... Take 1 tablet by mouth three times a day    Metolazone 5 Mg Tabs (Metolazone) .Marland Kitchen... Take 1 tablet by mouth once a day  Problem # 2:  CORONARY ARTERY DISEASE (ICD-414.00)  Patient is not having alot of angina. Her updated medication list for this problem includes:    Metoprolol Tartrate 100 Mg Tabs (Metoprolol tartrate) .Marland Kitchen... Take one tablet by mouth twice a day    Bayer Low Strength 81 Mg Tbec (Aspirin) .Marland Kitchen... Take 1 tablet by mouth once a day    Lisinopril 20 Mg Tabs (Lisinopril) .Marland Kitchen... Take 1 tablet by mouth two times a day    Plavix 75 Mg Tabs (Clopidogrel bisulfate) .Marland Kitchen... Take 1 tablet by mouth  once a day    Nitroglycerin 0.4 Mg Subl (Nitroglycerin) ..... One tablet under tongue every 5 minutes as needed for chest pain---may repeat times three    Nitro-bid 2 % Oint (Nitroglycerin) .Marland Kitchen... 1-2 inches to skin every 6 hours  Orders: EKG w/ Interpretation (93000)  Problem #  3:  HYPERLIPIDEMIA (ICD-272.4) Followed by Dr. Sandi Mariscal.   Her updated medication list for this problem includes:    Prevalite 4 Gm Pack (Cholestyramine light) .Marland Kitchen... Take 2-3 scoops two times a day    Lipitor 40 Mg Tabs (Atorvastatin calcium) .Marland Kitchen... 1 tablet by mouth once daily  Patient Instructions: 1)  Your physician recommends that you schedule a follow-up appointment in: 1 MONTH 2)  Your physician has recommended you make the following change in your medication: INCREASE Metoprolol tartrate to 100mg  by mouth twice a day Prescriptions: METOPROLOL TARTRATE 100 MG TABS (METOPROLOL TARTRATE) Take one tablet by mouth twice a day  #30 x 6   Entered by:   Theodosia Quay, RN, BSN   Authorized by:   Wadie Lessen, MD, [facility]   Signed by:   Theodosia Quay, RN, BSN on 05/07/2009   Method used:   Electronically to        [facility] (retail)       [address] [place], [place]  [address]       Ph: [phone]       Fax: [phone]   RxID:   [id number]  I spoke with Levada Dy at [facility] and she corrected the quantity on this medication. Theodosia Quay, RN, BSN  May 07, 2009 3:33 PM

## 2010-03-03 NOTE — Consult Note (Signed)
Summary: [facility]   Imported By: Jamelle Haring 06/24/2008 12:35:52  _____________________________________________________________________  External Attachment:    Type:   Image     Comment:   External Document

## 2010-03-03 NOTE — Assessment & Plan Note (Signed)
Summary: f2w   Primary Provider:  Derinda Late, M.D.  CC:  Chest pain- Sob- Tired- Migraines.  History of Present Illness: Tired alot of the time.  Short of breath, and requires some NTG.  Gets alot of headaches.  Has had migraine headaches, which she thinks may be driving BP.  Patient takes BP at home, and they have been as low as 180/94.  But they remain quite high.  She still throws up alot, and it is often just after the pills.  She will throw up at least once, and often two or three times per day.  She thinks the Minoxidil is upsetting her stomach.  If she eats, she gets sick from the food.  Has been taking Mobic regularly because of her hip.  Currently on an aggressive, multidrug regimen, with poor control.  Has been to the HTN unit at [facility], and sees Dr. Lorrene Reid  (also a pharmacist as well as Nephrologist) at [facility].  Current Medications (verified): 1)  Calcium 1500 Mg  Tabs (Calcium Carbonate) .... Take 1 Tablet By Mouth Once A Day 2)  Minoxidil 10 Mg  Tabs (Minoxidil) .... Take 1 Tablet By Mouth Three Times A Day 3)  Miacalcin 200 Unit/act Nasal Soln (Calcitonin ([place])) .... Inhale One Spray Alternating Nostrils Once A Day 4)  Prevalite 4 Gm  Pack (Cholestyramine Light) .... Take 2-3 Scoops Two Times A Day 5)  Lopressor 50 Mg  Tabs (Metoprolol Tartrate) .... Take 1 Tablet By Mouth Two Times A Day 6)  Tylenol Extra Strength 500 Mg  Tabs (Acetaminophen) .... Use As Needed 7)  Xanax 0.5 Mg  Tabs (Alprazolam) .... Take 1 Tablet By Mouth Three Times A Day 8)  Cozaar 100 Mg  Tabs (Losartan Potassium) .... Take 1 Tablet By Mouth Once A Day 9)  Lipitor 40 Mg Tabs (Atorvastatin Calcium) .Marland Kitchen.. 1 Tablet By Mouth Once Daily 10)  Lasix 80 Mg  Tabs (Furosemide) .... Take 1 Tablet By Mouth Two Times A Day 11)  Lantus 100 Unit/ml  Soln (Insulin Glargine) .Marland Kitchen.. 100 Units Daily 12)  Bayer Low Strength 81 Mg  Tbec (Aspirin) .... Take 1 Tablet By Mouth Once A Day 13)  Lisinopril 20 Mg   Tabs (Lisinopril) .... Take 1 Tablet By Mouth Two Times A Day 14)  Plavix 75 Mg  Tabs (Clopidogrel Bisulfate) .... Take 1 Tablet By Mouth Once A Day 15)  Multivitamins   Tabs (Multiple Vitamin) .... Take 1 Tablet By Mouth Once A Day 16)  Vitamin B-12 1000 Mcg  Tabs (Cyanocobalamin) .... Take 1 Tablet By Mouth Once A Day 17)  Albuterol 90 Mcg/act  Aers (Albuterol) .... Inhale Two Puffs Every Four To Six Hours As Needed 18)  Ambien 10 Mg  Tabs (Zolpidem Tartrate) .... Take 1 Tab By Mouth At Bedtime As Needed 19)  Flovent Hfa 110 Mcg/act  Aero (Fluticasone Propionate  Hfa) .... Inhale Two Puffs Two Times A Day As Needed 20)  Hydrocodone-Acetaminophen 5-500 Mg  Tabs (Hydrocodone-Acetaminophen) .... Use As Needed 21)  Mobic 7.5 Mg  Tabs (Meloxicam) .... Take 1 Tablet By Mouth Two Times A Day As Needed 22)  Migquin 325-100-65mg  .... Take Two Tabs At The Onset of A Migraine and Then One Every Hour Thereafter 23)  Clonidine Hcl 0.3 Mg Tabs (Clonidine Hcl) .... Take 1 Tablet By Mouth Three Times A Day 24)  Metolazone 5 Mg Tabs (Metolazone) .... Take 1 Tablet By Mouth Once A Day 25)  Nitroglycerin 0.4 Mg  Subl (Nitroglycerin) .... One Tablet Under Tongue Every 5 Minutes As Needed For Chest Pain---May Repeat Times Three 26)  Vitamin B Complex-C   Caps (B Complex-C) .... Take 1 Capsule By Mouth Once A Day 27)  Nitro-Bid 2 % Oint (Nitroglycerin) .Marland Kitchen.. 1-2 Inches To Skin Every 6 Hours 28)  Ranitidine Hcl 150 Mg Tabs (Ranitidine Hcl) .... Take 1 Tablet By Mouth Two Times A Day 29)  Advair Diskus 500-50 Mcg/dose Aepb (Fluticasone-Salmeterol) .... One Puff Every 12 Hours  Allergies: 1)  ! Penicillin 2)  ! Erythromycin 3)  Amoxicillin (Amoxicillin)  Vital Signs:  Patient profile:   65 year old female Height:      63 inches Weight:      248 pounds BMI:     44.09 Pulse rate:   66 / minute Pulse rhythm:   regular Resp:     20 per minute BP sitting:   200 / 100  (left arm) Cuff size:   large  Vitals  Entered By: Sidney Ace (October 30, 2008 2:14 PM)   EKG  Procedure date:  10/30/2008  Findings:      NSR.  LBBB.  Rate 68.  Impression & Recommendations:  Problem # 1:  HYPERTENSION (ICD-401.9) Poorly controlled with multiple issues, and is probably driving the other symptoms.  Options reviewed in detail.  So for the short term, increase metoprolol to one and one half tabs daily by mouth.  She will call with results of response.  Case discussed earlier with Dr. Lorrene Reid.. Her updated medication list for this problem includes:    Minoxidil 10 Mg Tabs (Minoxidil) .Marland Kitchen... Take 1 tablet by mouth three times a day    Metoprolol Tartrate 50 Mg Tabs (Metoprolol tartrate) .Marland Kitchen... Take one and one-half  tablet by mouth twice a day    Cozaar 100 Mg Tabs (Losartan potassium) .Marland Kitchen... Take 1 tablet by mouth once a day    Lasix 80 Mg Tabs (Furosemide) .Marland Kitchen... Take 1 tablet by mouth two times a day    Bayer Low Strength 81 Mg Tbec (Aspirin) .Marland Kitchen... Take 1 tablet by mouth once a day    Lisinopril 20 Mg Tabs (Lisinopril) .Marland Kitchen... Take 1 tablet by mouth two times a day    Clonidine Hcl 0.3 Mg Tabs (Clonidine hcl) .Marland Kitchen... Take 1 tablet by mouth three times a day    Metolazone 5 Mg Tabs (Metolazone) .Marland Kitchen... Take 1 tablet by mouth once a day  Problem # 2:  PERSISTENT VOMITING (ICD-536.2) Previous GI workup.  May be participating in poor BP response to meds.  She thinks Minoxidil is culprit.  Problem # 3:  CORONARY ARTERY DISEASE (ICD-414.00) Think best option is to better control BP.  See extensive notes. Her updated medication list for this problem includes:    Metoprolol Tartrate 50 Mg Tabs (Metoprolol tartrate) .Marland Kitchen... Take one and one-half  tablet by mouth twice a day    Bayer Low Strength 81 Mg Tbec (Aspirin) .Marland Kitchen... Take 1 tablet by mouth once a day    Lisinopril 20 Mg Tabs (Lisinopril) .Marland Kitchen... Take 1 tablet by mouth two times a day    Plavix 75 Mg Tabs (Clopidogrel bisulfate) .Marland Kitchen... Take 1 tablet by mouth once a day     Nitroglycerin 0.4 Mg Subl (Nitroglycerin) ..... One tablet under tongue every 5 minutes as needed for chest pain---may repeat times three    Nitro-bid 2 % Oint (Nitroglycerin) .Marland Kitchen... 1-2 inches to skin every 6 hours  Other Orders: EKG w/ Interpretation (93000)  Patient Instructions: 1)  Your physician recommends that you schedule a follow-up appointment in: 2 MONTHS 2)  Your physician has recommended you make the following change in your medication: Increase Metoprolol tartrate to 50mg  one and one-half tablet twice a day Prescriptions: METOPROLOL TARTRATE 50 MG TABS (METOPROLOL TARTRATE) Take one and one-half  tablet by mouth twice a day  #90 x 6   Entered by:   Theodosia Quay, RN, BSN   Authorized by:   Wadie Lessen, MD, [facility]   Signed by:   Theodosia Quay, RN, BSN on 10/30/2008   Method used:   Electronically to        [facility] (retail)       [address] [place], [place]  [address]       Ph: [phone]       Fax: [phone]   RxID:   [id number]

## 2010-03-03 NOTE — Letter (Signed)
Summary: Self-Recorded Med List  Self-Recorded Med List   Imported By: Marilynne Drivers 12/23/2009 16:00:56  _____________________________________________________________________  External Attachment:    Type:   Image     Comment:   External Document

## 2010-03-03 NOTE — Assessment & Plan Note (Signed)
Summary: f71m   Visit Type:  1 month follow up Primary Provider:  Derinda Late, M.D.  CC:  chest pains- Cramps on legs and arms and back.  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  Her chest pain is a little bit more, and it is associated with more activity, and now the heat.  Stopping resolves, and she is able to "walk" through.  She saw Dr. Lorrene Reid on May 5, and BP was really high at that time.  She received more clonidine, but developed nausea, and then vomiting of the pills.  .  She remains on multidrug therapy, followed both by Hypertension/nephrology, and has been seen at the [facility] Hypertension unit.  There has been some question of absorption.  Today she through up, but her mid day dosages she kept down.  Still using the nitropaste, and it is on today.    Current Medications (verified): 1)  Calcium 1500 Mg  Tabs (Calcium Carbonate) .... Take 1 Tablet By Mouth Once A Day 2)  Minoxidil 10 Mg  Tabs (Minoxidil) .... Take 1 1/2 Two Times A Day 3)  Prevalite 4 Gm  Pack (Cholestyramine Light) .... Take 2-3 Scoops Two Times A Day 4)  Metoprolol Tartrate 100 Mg Tabs (Metoprolol Tartrate) .... Take One Tablet By Mouth Twice A Day 5)  Tylenol Extra Strength 500 Mg  Tabs (Acetaminophen) .... Use As Needed 6)  Xanax 0.5 Mg  Tabs (Alprazolam) .... Take 1 Tablet By Mouth Three Times A Day 7)  Cozaar 100 Mg  Tabs (Losartan Potassium) .... Take 1 Tablet By Mouth Once A Day 8)  Lipitor 40 Mg Tabs (Atorvastatin Calcium) .Marland Kitchen.. 1 Tablet By Mouth Once Daily 9)  Lasix 80 Mg  Tabs (Furosemide) .... Take 1 Tablet By Mouth Two Times A Day As Needed 10)  Lantus 100 Unit/ml  Soln (Insulin Glargine) .Marland Kitchen.. 100 Units Daily 11)  Bayer Low Strength 81 Mg  Tbec (Aspirin) .... Take 1 Tablet By Mouth Once A Day 12)  Lisinopril 20 Mg  Tabs (Lisinopril) .... Take 1 Tablet By Mouth Two Times A Day 13)  Plavix 75 Mg  Tabs (Clopidogrel Bisulfate) .... Take 1 Tablet By Mouth Once A Day 14)   Multivitamins   Tabs (Multiple Vitamin) .... Take 1 Tablet By Mouth Once A Day 15)  Vitamin B-12 1000 Mcg  Tabs (Cyanocobalamin) .... Take 1 Tablet By Mouth Once A Day 16)  Albuterol 90 Mcg/act  Aers (Albuterol) .... Inhale Two Puffs Every Four To Six Hours As Needed 17)  Ambien 10 Mg  Tabs (Zolpidem Tartrate) .... Take 1 Tab By Mouth At Bedtime As Needed 18)  Flovent Hfa 110 Mcg/act  Aero (Fluticasone Propionate  Hfa) .... Inhale Two Puffs Two Times A Day As Needed 19)  Hydrocodone-Acetaminophen 5-500 Mg  Tabs (Hydrocodone-Acetaminophen) .... Use As Needed 20)  Mobic 7.5 Mg  Tabs (Meloxicam) .... Take 1 Tablet By Mouth Two Times A Day As Needed 21)  Migquin 325-100-65mg  .... Take Two Tabs At The Onset of A Migraine and Then One Every Hour Thereafter 22)  Clonidine Hcl 0.3 Mg Tabs (Clonidine Hcl) .... Take 1 Tablet By Mouth Three Times A Day 23)  Metolazone 5 Mg Tabs (Metolazone) .... Take 1 Tablet By Mouth Once A Day 24)  Nitroglycerin 0.4 Mg Subl (Nitroglycerin) .... One Tablet Under Tongue Every 5 Minutes As Needed For Chest Pain---May Repeat Times Three 25)  Vitamin B Complex-C   Caps (B Complex-C) .Marland KitchenMarland KitchenMarland Kitchen  Take 1 Capsule By Mouth Once A Day 26)  Nitro-Bid 2 % Oint (Nitroglycerin) .Marland Kitchen.. 1-2 Inches To Skin Every 6 Hours 27)  Ranitidine Hcl 150 Mg Tabs (Ranitidine Hcl) .... Take 1 Tablet By Mouth Two Times A Day 28)  Advair Diskus 500-50 Mcg/dose Aepb (Fluticasone-Salmeterol) .... One Puff Every 12 Hours 29)  Vitamin D (Ergocalciferol) 50000 Unit Caps (Ergocalciferol) .... Twice A Week  Allergies: 1)  ! Penicillin 2)  ! Erythromycin 3)  Amoxicillin (Amoxicillin)  Vital Signs:  Patient profile:   65 year old female Height:      63 inches Weight:      242 pounds BMI:     43.02 Pulse rate:   57 / minute Pulse rhythm:   irregular Resp:     18 per minute BP sitting:   210 / 86  (left arm) Cuff size:   large  Vitals Entered By: Sidney Ace (Jun 26, 2009 2:03 PM)  Physical  Exam  General:  Well developed, well nourished, in no acute distress. Head:  normocephalic and atraumatic Eyes:  PERRLA/EOM intact; conjunctiva and lids normal. Lungs:  Clear bilaterally to auscultation and percussion. Heart:  PMI non displaced.  Normal S1 and S2.  S4 gallop.  Soft apical murmur.   Abdomen:  Bowel sounds positive; abdomen soft and non-tender without masses, organomegaly, or hernias noted. No hepatosplenomegaly. Pulses:  pulses normal in all 4 extremities Extremities:  No clubbing or cyanosis. Neurologic:  Alert and oriented x 3.   EKG  Procedure date:  06/26/2009  Findings:      LVH with repolarization abnormality.    Impression & Recommendations:  Problem # 1:  CORONARY ARTERY DISEASE (ICD-414.00) CAD is symptomatic, but likely driven by inability to control hypertension.  Last cath without progression.  Have leaned toward continued medical therpay.  She understands issues well.   Her updated medication list for this problem includes:    Metoprolol Tartrate 100 Mg Tabs (Metoprolol tartrate) .Marland Kitchen... Take one tablet by mouth twice a day    Bayer Low Strength 81 Mg Tbec (Aspirin) .Marland Kitchen... Take 1 tablet by mouth once a day    Lisinopril 20 Mg Tabs (Lisinopril) .Marland Kitchen... Take 1 tablet by mouth two times a day    Plavix 75 Mg Tabs (Clopidogrel bisulfate) .Marland Kitchen... Take 1 tablet by mouth once a day    Nitroglycerin 0.4 Mg Subl (Nitroglycerin) ..... One tablet under tongue every 5 minutes as needed for chest pain---may repeat times three    Nitro-bid 2 % Oint (Nitroglycerin) .Marland Kitchen... 1-2 inches to skin every 6 hours  Problem # 2:  HYPERTENSION (ICD-401.9) Followed closely by Dr. Lorrene Reid  (Nephro/hypertension), and has been seen at HTN unit at [facility].  Control is difficult, and may be absorption issue.  In hospital was controlled.  Had an event in Dr. Clare Gandy office.  Vomits meds quickly.  Has problems with clonidine patch.  Does use NTG patch.  In hospital, had profound impact from Minoxidil,  with prolonged hypotension.  Will continue to defer to Nephro.  ECG increasingly reflects lack of control.  Might be candidate for RF ablation eventually, although unknowns remain.   Her updated medication list for this problem includes:    Minoxidil 10 Mg Tabs (Minoxidil) .Marland Kitchen... Take 1 1/2 two times a day    Metoprolol Tartrate 100 Mg Tabs (Metoprolol tartrate) .Marland Kitchen... Take one tablet by mouth twice a day    Cozaar 100 Mg Tabs (Losartan potassium) .Marland Kitchen... Take 1 tablet by mouth once  a day    Lasix 80 Mg Tabs (Furosemide) .Marland Kitchen... Take 1 tablet by mouth two times a day as needed    Bayer Low Strength 81 Mg Tbec (Aspirin) .Marland Kitchen... Take 1 tablet by mouth once a day    Lisinopril 20 Mg Tabs (Lisinopril) .Marland Kitchen... Take 1 tablet by mouth two times a day    Clonidine Hcl 0.3 Mg Tabs (Clonidine hcl) .Marland Kitchen... Take 1 tablet by mouth three times a day    Metolazone 5 Mg Tabs (Metolazone) .Marland Kitchen... Take 1 tablet by mouth once a day  Problem # 3:  HYPERLIPIDEMIA (ICD-272.4) per Dr. Sandi Mariscal, who follows patient closely.  Her updated medication list for this problem includes:    Prevalite 4 Gm Pack (Cholestyramine light) .Marland Kitchen... Take 2-3 scoops two times a day    Lipitor 40 Mg Tabs (Atorvastatin calcium) .Marland Kitchen... 1 tablet by mouth once daily  Patient Instructions: 1)  Your physician recommends that you schedule a follow-up appointment in: 3 MONTHS 2)  Your physician recommends that you continue on your current medications as directed. Please refer to the Current Medication list given to you today.

## 2010-03-03 NOTE — Miscellaneous (Signed)
Summary: Small Bowel Follow Through  Clinical Lists Changes  Orders: Added new Test order of Small Bowel Follow Through (Sm Bowel FT) - Signed

## 2010-03-03 NOTE — Assessment & Plan Note (Signed)
Summary: sleep problem/ mbw   Chief Complaint:  follow up.  History of Present Illness: The patient returns today for follow-up of her known moderate obstructive sleep apnea.  The patient then lost to follow-up and has not been seen since 2006.  She was completely intolerant of CPAP despite my attempts to desensitization as well as trying a sedative hypnotic.  The patient quit using the CPAP machine, and has gone untreated since that time.  She is here today because she continues to have symptoms as well as blood pressure issues.  She would like to discuss other options for treatment of her sleep apnea.   Current Allergies: ! PENICILLIN ! ERYTHROMYCIN    Risk Factors:  Tobacco use:  never    Vital Signs:  Patient Profile:   65 Years Old Female Weight:      264.50 pounds O2 Sat:      97 % Temp:     98.4 degrees F oral Pulse rate:   72 / minute BP sitting:   164 / 108  (left arm)  Vitals Entered By: Valrie Hart LPN (December 16, [id number] 10:46 AM) Oxygen therapy Room Air             Comments pt hasn't been seen in 2 year.  pt c/o feeling "clausterphobic with mask" pt would like an alternative to treat her OSA other than the cpap machine.      Physical Exam  General:     normal appearance and obese.       Impression & Recommendations:  Problem # 1:  OBSTRUCTIVE SLEEP APNEA (ICD-327.23) The pt wishes to look at other options for treatment of her osa.  She is totally intolerant to cpap, even with me working with her on desensitization and trying sedative hypnotic.  I discussed with her the importance of weight loss, as well as option of oral appliance and ua surgery.  At this point she is willing to have ent eval to discuss possible surgical treatments.   We could also refer her to Dr. Roderic Ovens at [facility] to consider oral appliance if she wishes.,   Medications Added to Medication List This Visit: 1)  Miacalcin 200 Unit/act Nasal Soln (Calcitonin (salmon)) .... Inhale one  spray alternating nostrils once a day 2)  Xanax 0.5 Mg Tabs (Alprazolam) .... Take 1 tablet by mouth three times a day 3)  Clonidine Hcl 0.2 Mg Tabs (Clonidine hcl) .... Take 1 tablet by mouth once a day when systolic bp is over [id number] 4)  Lantus 100 Unit/ml Soln (Insulin glargine) .... 70 units each morning 5)  Ambien 10 Mg Tabs (Zolpidem tartrate) .... Take 1 tab by mouth at bedtime as needed 6)  Flovent Hfa 110 Mcg/act Aero (Fluticasone propionate  hfa) .... Inhale two puffs two times a day as needed 7)  Mobic 7.5 Mg Tabs (Meloxicam) .... Take 1 tablet by mouth two times a day as needed 8)  Migquin 325-100-65mg   .... Take two tabs at the onset of a migraine and then one every hour thereafter 9)  Norvasc 5 Mg Tabs (Amlodipine besylate) .... Take 1 tablet by mouth once a day   Patient Instructions: 1)  Please schedule a follow-up appointment as needed 2)  will refer to ent for upper airway eval.   ]

## 2010-03-03 NOTE — Progress Notes (Signed)
Summary: re poss bypass surgery  Phone Note Call from Patient   Caller: Patient [phone] Reason for Call: Talk to Nurse Summary of Call: pt calling re visit monday, poss bypass surgery-knows it will be friday-pls call Initial call taken by: Lorenda Hatchet,  November 27, 2009 10:33 AM  Follow-up for Phone Call        Left message for pt to call back. Theodosia Quay, RN, BSN  November 28, 2009 9:58 AM  Additional Follow-up for Phone Call Additional follow up Details #1::        pt rtn your [phone] pt will be at this number for an hour. Shelda Pal  November 28, 2009 10:12 AM   The pt would like to meet with Dr Cyndia Bent about CABG. The pt will be available the weeks of November 7th,  14th, and  28th.  The pt can be reached at her home number or cell [phone].  Referral placed.  Additional Follow-up by: Theodosia Quay, RN, BSN,  November 28, 2009 10:24 AM

## 2010-03-03 NOTE — Progress Notes (Signed)
  Phone Note From Other Clinic   Caller: Levada Dy Call For: Echo     FAxed Echo over to [facility] with Dr.Bloomberg's Office to fax [place]  December 11, 2008 1:49 PM

## 2010-03-03 NOTE — Progress Notes (Signed)
Summary: Patient's at [place] List  Patient's at [place] By: Marilynne Drivers 11/12/2008 17:20:16  _____________________________________________________________________  External Attachment:    Type:   Image     Comment:   External Document

## 2010-03-03 NOTE — Letter (Signed)
Summary: [facility]   Imported By: Ranell Patrick 07/30/2009 08:38:47  _____________________________________________________________________  External Attachment:    Type:   Image     Comment:   External Document

## 2010-03-03 NOTE — Procedures (Signed)
Summary: EGD   EGD  Procedure date:  03/04/2004  Findings:      Location: [place]   Patient Name: [name], [name]. [name]: [id number] Procedure Procedures: Panendoscopy (EGD) CPT: [id number].    with biopsy(s)/brushing(s). CPT: T4586919.  Personnel: Endoscopist: Matteson Blue L. Olevia Perches, MD.  Exam Location: Exam performed in Outpatient Clinic. Outpatient  Patient Consent: Procedure, Alternatives, Risks and Benefits discussed, consent obtained, from patient. Consent was obtained by the RN.  Indications Symptoms: Vomiting.  History  Current Medications: Patient is on an anticoagulant. Patient is not currently taking Coumadin.  Pre-Exam Physical: Performed Mar 04, 2004  Cardio-pulmonary exam, HEENT exam, Abdominal exam, Extremity exam, Neurological exam, Mental status exam WNL.  Exam Exam Info: Maximum depth of insertion Duodenum, intended Duodenum. Vocal cords visualized. Gastric retroflexion performed. Images taken. ASA Classification: I. Tolerance: good.  Sedation Meds: Patient assessed and found to be appropriate for moderate (conscious) sedation. Fentanyl 100 mcg. given IV. Versed 10 mg. given IV. Cetacaine Spray 2 sprays given aerosolized.  Monitoring: BP and pulse monitoring done. Oximetry used. Supplemental O2 given  Findings OTHER FINDING: in retained surgical clip.  - PRIOR SURGERY: Cardia. Banded Gastroplasty. Anastamosis not present. ICD9: Post Operative Vomiting: 564.3. Comments: Martin Majestic undone gastroplasty from1980, gastric remnant proximal to the staples measures about 4 cm (36 to 40cm).  DIAGNOSTIC TEST: from Antrum. RUT done, results pending Reason: no evidence of retained food.   Assessment Abnormal examination, see findings above.  Diagnoses: 564.3: Post Operative Vomiting.   Comments: s/p remote stapling procedure, partly undone, stomach proximal to the staples measures 4 cm, no retained food Events  Unplanned Intervention: No unplanned  interventions were required.  Unplanned Events: There were no complications. Plans Medication(s): Await pathology. PPI: Pantoprazole/Protonix 40 mg BID, starting Mar 04, 2004   Comments: small feedings to prevent her from vomiting Disposition: After procedure patient sent to recovery. After recovery patient sent home.    RUT NEGATIVE   This report was created from the original endoscopy report, which was reviewed and signed by the above listed endoscopist.

## 2010-03-03 NOTE — Letter (Signed)
Summary: [facility]   Imported By: Edmonia James 01/16/2008 11:47:36  _____________________________________________________________________  External Attachment:    Type:   Image     Comment:   External Document

## 2010-03-03 NOTE — Progress Notes (Signed)
Summary: TEST RESULTS   Phone Note Call from Patient Call back at Home Phone [phone]   Caller: Patient Reason for Call: Talk to Nurse, Lab or Test Results Summary of Call: echo. Initial call taken by: Neil Crouch,  July 19, 2008 9:39 AM  Follow-up for Phone Call        echo results given Follow-up by: Leodis Sias, RN,  July 19, 2008 9:49 AM   New Allergies: AMOXICILLIN (AMOXICILLIN) New Allergies: AMOXICILLIN (AMOXICILLIN)

## 2010-03-03 NOTE — Procedures (Signed)
Summary: COLON   Colonoscopy  Procedure date:  06/30/2005  Findings:      Location:  [place].    Procedures Next Due Date:    Colonoscopy: 07/2015  Patient Name: [name] [name], [name] [name]. [name]: [id number] Procedure Procedures: Colonoscopy CPT: [phone].  Personnel: Endoscopist: Tawanna Funk L. Olevia Perches, MD.  Exam Location: Exam performed in Outpatient Clinic. Outpatient  Patient Consent: Procedure, Alternatives, Risks and Benefits discussed, consent obtained, from patient. Consent was obtained by the RN.  Indications  Average Risk Screening Routine.  History  Current Medications: Patient is taking an non-steroidal medication. Patient is on an anticoagulant. Patient is not currently taking Coumadin.  Pre-Exam Physical: Performed Jun 30, 2005. Cardio-pulmonary exam, HEENT exam , Abdominal exam, Extremity exam, Neurological exam, Mental status exam WNL.  Comments: Pt. history reviewed/updated, physical exam performed prior to initiation of sedation?yes Exam Exam: Extent of exam reached: Cecum, extent intended: Cecum.  The cecum was identified by appendiceal orifice and IC valve. Colon retroflexion performed. Images taken. ASA Classification: II. Tolerance: good.  Monitoring: Pulse and BP monitoring, Oximetry used. Supplemental O2 given.  Colon Prep Used osmoprep for colon prep. Prep results: good.  Sedation Meds: Patient assessed and found to be appropriate for moderate (conscious) sedation. Fentanyl 125 mcg. given IV. Versed 12 given IV.  Findings - NORMAL EXAM: Cecum.  - NORMAL EXAM: Rectum.    Comments: scope withdrawal time about 7 minutes Assessment Normal examination.  Comments: no polyps Events  Unplanned Interventions: No intervention was required.  Unplanned Events: There were no complications. Plans Patient Education: Patient given standard instructions for: Yearly hemoccult testing recommended. Patient instructed to get routine colonoscopy every  10 years.  Comments: continue Plavix and ASA Disposition: After procedure patient sent to recovery. After recovery patient sent home.    This report was created from the original endoscopy report, which was reviewed and signed by the above listed endoscopist.

## 2010-03-03 NOTE — Assessment & Plan Note (Signed)
Summary: [name] [name]   Visit Type:  Follow-up Primary Provider:  Derinda Late, M.D.  CC:  shortness of breath.  History of Present Illness: Conitnuing to have problems with BP and shortness of breath.  Can't seem to tolerate medications that are absorbed through skin, and some speculation  (Nephrology, [facility]) that she may have medication absorption problem. Last cath revealed patent stent, non obs LAD, and total RCA as before, and not felt to be benefitted at this point with CABG.  Nonetheless, her BP remains up, hard to control, and she is being seen frequently in the Nephrology clinic.  Current Medications (verified): 1)  Calcium 1500 Mg  Tabs (Calcium Carbonate) .... Take 1 Tablet By Mouth Once A Day 2)  Minoxidil 10 Mg  Tabs (Minoxidil) .... Take 1 Tablet By Mouth Three Times A Day 3)  Miacalcin 200 Unit/act Nasal Soln (Calcitonin (Salmon)) .... Inhale One Spray Alternating Nostrils Once A Day 4)  Prevalite 4 Gm  Pack (Cholestyramine Light) .... Take 2-3 Scoops Two Times A Day 5)  Lopressor 50 Mg  Tabs (Metoprolol Tartrate) .... Take 1 Tablet By Mouth Two Times A Day 6)  Tylenol Extra Strength 500 Mg  Tabs (Acetaminophen) .... Use As Needed 7)  Xanax 0.5 Mg  Tabs (Alprazolam) .... Take 1 Tablet By Mouth Three Times A Day 8)  Cozaar 100 Mg  Tabs (Losartan Potassium) .... Take 1 Tablet By Mouth Once A Day 9)  Lipitor 40 Mg Tabs (Atorvastatin Calcium) .Marland Kitchen.. 1 Tablet By Mouth Once Daily 10)  Lasix 80 Mg  Tabs (Furosemide) .... Take 1 Tablet By Mouth Two Times A Day 11)  Lantus 100 Unit/ml  Soln (Insulin Glargine) .Marland Kitchen.. 100 Units Daily 12)  Bayer Low Strength 81 Mg  Tbec (Aspirin) .... Take 1 Tablet By Mouth Once A Day 13)  Lisinopril 20 Mg  Tabs (Lisinopril) .... Take 1 Tablet By Mouth Two Times A Day 14)  Plavix 75 Mg  Tabs (Clopidogrel Bisulfate) .... Take 1 Tablet By Mouth Once A Day 15)  Multivitamins   Tabs (Multiple Vitamin) .... Take 1 Tablet By Mouth Once A Day 16)   Vitamin B-12 1000 Mcg  Tabs (Cyanocobalamin) .... Take 1 Tablet By Mouth Once A Day 17)  Albuterol 90 Mcg/act  Aers (Albuterol) .... Inhale Two Puffs Every Four To Six Hours As Needed 18)  Ambien 10 Mg  Tabs (Zolpidem Tartrate) .... Take 1 Tab By Mouth At Bedtime As Needed 19)  Flovent Hfa 110 Mcg/act  Aero (Fluticasone Propionate  Hfa) .... Inhale Two Puffs Two Times A Day As Needed 20)  Hydrocodone-Acetaminophen 5-500 Mg  Tabs (Hydrocodone-Acetaminophen) .... Use As Needed 21)  Mobic 7.5 Mg  Tabs (Meloxicam) .... Take 1 Tablet By Mouth Two Times A Day As Needed 22)  Migquin 325-100-65mg  .... Take Two Tabs At The Onset of A Migraine and Then One Every Hour Thereafter 23)  Clonidine Hcl 0.3 Mg Tabs (Clonidine Hcl) .... Take 1 Tablet By Mouth Three Times A Day 24)  Metolazone 5 Mg Tabs (Metolazone) .... Take 1 Tablet By Mouth Once A Day 25)  Nitroglycerin 0.4 Mg Subl (Nitroglycerin) .... One Tablet Under Tongue Every 5 Minutes As Needed For Chest Pain---May Repeat Times Three 26)  Vitamin D3 1000 Unit Caps (Cholecalciferol) .... Take 1 Capsule By Mouth Once A Day 27)  Vitamin B Complex-C   Caps (B Complex-C) .... Take 1 Capsule By Mouth Once A Day 28)  Nitro-Bid 2 % Oint (Nitroglycerin) .Marland Kitchen.. 1-2  Inches To Skin Every 6 Hours 29)  Kls Acid Reducer 75 Mg Tabs (Ranitidine Hcl) .... Take One Tablet By Mouth Twice Daily. 30)  Advair Diskus 250-50 Mcg/dose Aepb (Fluticasone-Salmeterol) .... As Needed  Allergies (verified): 1)  ! Penicillin 2)  ! Erythromycin 3)  Amoxicillin (Amoxicillin)  Vital Signs:  Patient profile:   65 year old female Height:      63 inches Weight:      245 pounds BMI:     43.56 Pulse rate:   74 / minute BP sitting:   210 / 106  (left arm)  Vitals Entered By: Margaretmary Bayley CMA (October 09, 2008 4:28 PM)  Physical Exam  General:  Well developed, well nourished, in no acute distress. Lungs:  Clear bilaterally to auscultation and percussion. Heart:  Normal S1.  Positive  S4.  Paradoxical split of S2. Abdomen:  Soft without masses.  Obesity as previously noted. Extremities:  No clubbing or cyanosis.  No edema.   EKG  Procedure date:  10/09/2008  Findings:      NSR.  LBBB with wide QRS  Echocardiogram  Procedure date:  07/12/2008  Findings:      Study Conclusions            1. Left ventricle: There is akinesis at the base of the posterior        wall. The other walls move well. The cavity size was normal. Wall        thickness was increased in a pattern of mild LVH. Systolic        function was normal. The estimated ejection fraction was in the        range of 55% to 60%.     2. Mitral valve: Mildly calcified annulus. Moderate regurgitation.     3. Left atrium: The atrium was mildly dilated.     Impressions:            - Tissue doppler data suggests that there may be some increase in LV       filling pressure.  Impression & Recommendations:  Problem # 1:  CORONARY ARTERY DISEASE (ICD-414.00) remains on medical therapy, and stable, but some chest pain and SOB, prob mediated mostly by hypertension.  med control difficult as noted. Her updated medication list for this problem includes:    Lopressor 50 Mg Tabs (Metoprolol tartrate) .Marland Kitchen... Take 1 tablet by mouth two times a day    Bayer Low Strength 81 Mg Tbec (Aspirin) .Marland Kitchen... Take 1 tablet by mouth once a day    Lisinopril 20 Mg Tabs (Lisinopril) .Marland Kitchen... Take 1 tablet by mouth two times a day    Plavix 75 Mg Tabs (Clopidogrel bisulfate) .Marland Kitchen... Take 1 tablet by mouth once a day    Nitroglycerin 0.4 Mg Subl (Nitroglycerin) ..... One tablet under tongue every 5 minutes as needed for chest pain---may repeat times three    Nitro-bid 2 % Oint (Nitroglycerin) .Marland Kitchen... 1-2 inches to skin every 6 hours  Orders: EKG w/ Interpretation (93000)  Problem # 2:  PAPILLARY MUSCLE DYSFUNCTION, NON-RHEUMATIC (ICD-429.81) Has some MR, but not really notable by exam.  Prior inferior MI with total RCA.  Diastolic  abnormality and also severe hypertension contributing.  Orders: EKG w/ Interpretation (93000)  Problem # 3:  HYPERTENSION (ICD-401.9) Managed by Dr. Lorrene Reid and [facility] consult.  Difficult and poorly controlled.  Suspect absorption issue, but cannot take dermal medications because of rash and breakout.  Will continue to discuss with  Dr. Lorrene Reid, and see soon in followup.  Overall situation remains difficult. Her updated medication list for this problem includes:    Minoxidil 10 Mg Tabs (Minoxidil) .Marland Kitchen... Take 1 tablet by mouth three times a day    Lopressor 50 Mg Tabs (Metoprolol tartrate) .Marland Kitchen... Take 1 tablet by mouth two times a day    Cozaar 100 Mg Tabs (Losartan potassium) .Marland Kitchen... Take 1 tablet by mouth once a day    Lasix 80 Mg Tabs (Furosemide) .Marland Kitchen... Take 1 tablet by mouth two times a day    Bayer Low Strength 81 Mg Tbec (Aspirin) .Marland Kitchen... Take 1 tablet by mouth once a day    Lisinopril 20 Mg Tabs (Lisinopril) .Marland Kitchen... Take 1 tablet by mouth two times a day    Clonidine Hcl 0.3 Mg Tabs (Clonidine hcl) .Marland Kitchen... Take 1 tablet by mouth three times a day    Metolazone 5 Mg Tabs (Metolazone) .Marland Kitchen... Take 1 tablet by mouth once a day  Orders: EKG w/ Interpretation (93000)  Patient Instructions: 1)  Your physician recommends that you schedule a follow-up appointment in: 3 WEEKS 2)  Your physician recommends that you continue on your current medications as directed. Please refer to the Current Medication list given to you today.

## 2010-03-03 NOTE — Assessment & Plan Note (Signed)
Summary: eph/post cath   Visit Type:  Follow-up Primary Provider:  Derinda Late, M.D.  CC:  Chest pains.  History of Present Illness: Stable at present time, but continues to have chest pain, mostly with exertion.  This is mostly with a mild degree of activity.  I have reviewed the case with Dr. Cyndia Bent, who has reviewed the films.  She denies severe rest pain.  She continues to have medication related nausea, and we have discussed options with regard to treatment.  She often throws up her medications. She has been to [facility] for evaluation.    Current Medications (verified): 1)  Calcium 1500 Mg  Tabs (Calcium Carbonate) .... Take 1 Tablet By Mouth Once A Day 2)  Minoxidil 10 Mg  Tabs (Minoxidil) .... Take 1 1/2 Two Times A Day 3)  Prevalite 4 Gm  Pack (Cholestyramine Light) .... Take 2-3 Scoops Two Times A Day 4)  Metoprolol Tartrate 100 Mg Tabs (Metoprolol Tartrate) .... Take One Tablet By Mouth Twice A Day 5)  Tylenol Extra Strength 500 Mg  Tabs (Acetaminophen) .... Use As Needed 6)  Xanax 0.5 Mg  Tabs (Alprazolam) .... Take 1 Tablet By Mouth Three Times A Day 7)  Cozaar 100 Mg  Tabs (Losartan Potassium) .... Take 1 Tablet By Mouth Once A Day 8)  Lipitor 40 Mg Tabs (Atorvastatin Calcium) .Marland Kitchen.. 1 Tablet By Mouth Once Daily 9)  Lasix 80 Mg  Tabs (Furosemide) .... Take 1 Tablet By Mouth Two Times A Day As Needed 10)  Lantus 100 Unit/ml  Soln (Insulin Glargine) .Marland Kitchen.. 100 Units Daily 11)  Bayer Low Strength 81 Mg  Tbec (Aspirin) .... Take 1 Tablet By Mouth Once A Day 12)  Lisinopril 20 Mg  Tabs (Lisinopril) .... Take 1 Tablet By Mouth Two Times A Day 13)  Plavix 75 Mg  Tabs (Clopidogrel Bisulfate) .... Take 1 Tablet By Mouth Once A Day 14)  Multivitamins   Tabs (Multiple Vitamin) .... Take 1 Tablet By Mouth Once A Day 15)  Vitamin B-12 1000 Mcg  Tabs (Cyanocobalamin) .... Take 1 Tablet By Mouth Once A Day 16)  Albuterol 90 Mcg/act  Aers (Albuterol) .... Inhale Two Puffs Every Four To Six Hours  As Needed 17)  Ambien 10 Mg  Tabs (Zolpidem Tartrate) .... Take 1 Tab By Mouth At Bedtime As Needed 18)  Flovent Hfa 110 Mcg/act  Aero (Fluticasone Propionate  Hfa) .... Inhale Two Puffs Two Times A Day As Needed 19)  Hydrocodone-Acetaminophen 5-500 Mg  Tabs (Hydrocodone-Acetaminophen) .... Use As Needed 20)  Mobic 7.5 Mg  Tabs (Meloxicam) .... Take 1 Tablet By Mouth Two Times A Day As Needed 21)  Migquin 325-100-65mg  .... Take Two Tabs At The Onset of A Migraine and Then One Every Hour Thereafter 22)  Clonidine Hcl 0.3 Mg Tabs (Clonidine Hcl) .... Take 1 Tablet By Mouth Three Times A Day 23)  Metolazone 5 Mg Tabs (Metolazone) .... Take 1 Tablet By Mouth Once A Day 24)  Nitroglycerin 0.4 Mg Subl (Nitroglycerin) .... One Tablet Under Tongue Every 5 Minutes As Needed For Chest Pain---May Repeat Times Three 25)  Vitamin B Complex-C   Caps (B Complex-C) .... Take 1 Capsule By Mouth Once A Day 26)  Nitro-Bid 2 % Oint (Nitroglycerin) .Marland Kitchen.. 1-2 Inches To Skin Every 6 Hours 27)  Ranitidine Hcl 150 Mg Tabs (Ranitidine Hcl) .... Take 1 Tablet By Mouth Two Times A Day 28)  Advair Diskus 500-50 Mcg/dose Aepb (Fluticasone-Salmeterol) .... One Puff Every 12 Hours  29)  Vitamin D (Ergocalciferol) 50000 Unit Caps (Ergocalciferol) .... Twice A Week 30)  Zofran 4 Mg Tabs (Ondansetron Hcl) .... Two Times A Day As Needed  Allergies: 1)  ! Penicillin 2)  ! Erythromycin 3)  Amoxicillin (Amoxicillin)  Past History:  Past Surgical History: Last updated: 03/21/2009 percutanerous coronary intervention gastric stapling-1970 Ovarian tumor removal right leg surgery- rod and 2 pins placed Hysterectomy hernia repair/lysis of adhesions Angioplasty -multiple times cholecystectomy  Past Medical History: Reviewed history from 03/31/2008 and no changes required. Current Problems:  MIGRAINES, HX OF (ICD-V13.8) OSTEOARTHRITIS (ICD-715.90) PERSISTENT VOMITING (ICD-536.2) OBSTRUCTIVE SLEEP APNEA (ICD-327.23) ASTHMA  (ICD-493.90) Hx of VENTRAL HERNIA (ICD-553.20) DM (ICD-250.00) GERD (ICD-530.81) OBESITY, MORBID (ICD-278.01) HYPERLIPIDEMIA (ICD-272.4) HYPERTENSION (ICD-401.9), severe CORONARY ARTERY DISEASE (ICD-414.00)  Family History: Reviewed history from 03/21/2009 and no changes required. Family History of Heart Disease: Father Family History of Diabetes: paternal grandmother/aunt/grandfather Uncles No FH of Colon Cancer:  Social History: Reviewed history from 03/21/2009 and no changes required. Alcohol Use - no Illicit Drug Use - no Patient has never smoked.  Daily Caffeine Use  Vital Signs:  Patient profile:   65 year old female Height:      63 inches Weight:      241.75 pounds BMI:     42.98 Pulse rate:   79 / minute Pulse rhythm:   regular Resp:     18 per minute BP sitting:   210 / 110  (left arm) Cuff size:   large  Vitals Entered By: Sidney Ace (November 24, 2009 3:38 PM)  Physical Exam  General:  Well developed, well nourished, in no acute distress. Head:  normocephalic and atraumatic Eyes:  PERRLA/EOM intact; conjunctiva and lids normal. Lungs:  Clear bilaterally to auscultation and percussion. Heart:  Paradoxical splitting of S2.  Minimal SEM.  No DM.  Extremities:  wrist looks ok.  Good pulse. Neurologic:  Alert and oriented x 3.   EKG  Procedure date:  11/24/2009  Findings:      NSR. LBBB.  No change from prior tracings.  Impression & Recommendations:  Problem # 1:  CORONARY ARTERY DISEASE (ICD-414.00) I had Dr. Cyndia Bent review films.  LAD is not critical, but there is also diag, OM disease, and total RCA.  She has more angina than a year ago, so of which is related to poor BP control.  Dr. Cyndia Bent felt that she could have CABG.  It is somewhat problematic, but would lean toward this if she is so inclined. Her updated medication list for this problem includes:    Metoprolol Tartrate 100 Mg Tabs (Metoprolol tartrate) .Marland Kitchen... Take one tablet by mouth twice a  day    Bayer Low Strength 81 Mg Tbec (Aspirin) .Marland Kitchen... Take 1 tablet by mouth once a day    Lisinopril 20 Mg Tabs (Lisinopril) .Marland Kitchen... Take 1 tablet by mouth two times a day    Plavix 75 Mg Tabs (Clopidogrel bisulfate) .Marland Kitchen... Take 1 tablet by mouth once a day    Nitroglycerin 0.4 Mg Subl (Nitroglycerin) ..... One tablet under tongue every 5 minutes as needed for chest pain---may repeat times three    Nitro-bid 2 % Oint (Nitroglycerin) .Marland Kitchen... 1-2 inches to skin every 6 hours  Problem # 2:  HYPERTENSION (ICD-401.9) Difficult to control.  Probably poor absorption.  reviewed with patient.  Suggeset sl NTG before walking.  Her updated medication list for this problem includes:    Minoxidil 10 Mg Tabs (Minoxidil) .Marland Kitchen... Take 1 1/2 two times a day  Metoprolol Tartrate 100 Mg Tabs (Metoprolol tartrate) .Marland Kitchen... Take one tablet by mouth twice a day    Cozaar 100 Mg Tabs (Losartan potassium) .Marland Kitchen... Take 1 tablet by mouth once a day    Lasix 80 Mg Tabs (Furosemide) .Marland Kitchen... Take 1 tablet by mouth two times a day as needed    Bayer Low Strength 81 Mg Tbec (Aspirin) .Marland Kitchen... Take 1 tablet by mouth once a day    Lisinopril 20 Mg Tabs (Lisinopril) .Marland Kitchen... Take 1 tablet by mouth two times a day    Clonidine Hcl 0.3 Mg Tabs (Clonidine hcl) .Marland Kitchen... Take 1 tablet by mouth three times a day    Metolazone 5 Mg Tabs (Metolazone) .Marland Kitchen... Take 1 tablet by mouth once a day  Problem # 3:  HYPERLIPIDEMIA (ICD-272.4) Followed by Dr. Sandi Mariscal. Her updated medication list for this problem includes:    Prevalite 4 Gm Pack (Cholestyramine light) .Marland Kitchen... Take 2-3 scoops two times a day    Lipitor 40 Mg Tabs (Atorvastatin calcium) .Marland Kitchen... 1 tablet by mouth once daily  Patient Instructions: 1)  Your physician recommends that you schedule a follow-up appointment in: 1 MONTH 2)  Your physician recommends that you continue on your current medications as directed. Please refer to the Current Medication list given to you today. 3)  Discuss Carotid with  Dr Sandi Mariscal. 4)  Please call back if you would like a referral to Dr Cyndia Bent.

## 2010-03-03 NOTE — Letter (Signed)
Summary: [facility] Note  [facility] Note   Imported By: Sallee Provencal 09/24/2008 13:28:17  _____________________________________________________________________  External Attachment:    Type:   Image     Comment:   External Document

## 2010-03-03 NOTE — Letter (Signed)
Summary: Cardiac Catheterization Instructions- [place], [place]  [id number] [place]   [place], [place] [address]   Phone: [phone]  Fax: [phone]     11/03/2009 [name]: [id number]  [facility] [address] [place], [place]  [address]  Dear [name] [name],   You are scheduled for a Cardiac Catheterization on Monday November 10, 2009 with Dr. [name].  Please arrive to the 1st floor of the Heart and Vascular Center at [facility] at 9:30 am on the day of your procedure. Please do not arrive before 6:30 a.m. Call the Heart and Vascular Center at [phone] if you are unable to make your appointmnet. The Code to get into the parking garage under the building is 0200. Take the elevators to the 1st floor. You must have someone to drive you home. Someone must be with you for the first 24 hours after you arrive home. Please wear clothes that are easy to get on and off and wear slip-on shoes. Do not eat or drink after midnight except water with your medications that morning. Bring all your medications and current insurance cards with you.  _X__ DO NOT take these medications before your procedure: DO NOT TAKE LASIX OR METOLAZONE THE MORNING OF PROCEDURE.  DO NOT TAKE LANTUS THE MORNING OF PROCEDURE.  IF YOU NORMALLY TAKE LANTUS IN THE EVENING, PLEASE TAKE HALF OF YOUR DOSE ON SUNDAY NIGHT.   _X__ Make sure you take your aspirin and plavix.  _X__ You may take ALL of your medications with water that morning.   The usual length of stay after your procedure is 2 to 3 hours. This can vary.  If you have any questions, please call the office at the number listed above.   Theodosia Quay, RN, BSN

## 2010-03-03 NOTE — Miscellaneous (Signed)
Summary: Orders Update  Clinical Lists Changes  Problems: Added new problem of * CAROTID STENOSIS Orders: Added new Test order of Carotid Duplex (Carotid Duplex) - Signed

## 2010-03-05 NOTE — Assessment & Plan Note (Signed)
Summary: f5m   Visit Type:  Follow-up Primary Provider:  Derinda Late, M.D.  CC:  chest pain, sob, and little dizziness.  History of Present Illness: Has seen Dr. Cyndia Bent, and surgery is set for December 28.  She is anxious to proceed.  Her BP is difficult to control, and she may be eligible for the Simplicity trial.  Have discussed with her and will approach after the CABG.    Problems Prior to Update: 1)  Carotid Stenosis  () 2)  Papillary Muscle Dysfunction, Non-rheumatic  (ICD-429.81) 3)  Myocardial Infarction, Hx of  (ICD-412) 4)  Unspecified Vitamin D Deficiency  (ICD-268.9) 5)  Migraines, Hx of  (ICD-V13.8) 6)  Osteoarthritis  (ICD-715.90) 7)  Persistent Vomiting  (ICD-536.2) 8)  Obstructive Sleep Apnea  (ICD-327.23) 9)  Asthma  (ICD-493.90) 10)  Hx of Ventral Hernia  (ICD-553.20) 11)  Dm  (ICD-250.00) 12)  Gerd  (ICD-530.81) 13)  Obesity, Morbid  (ICD-278.01) 14)  Hyperlipidemia  (ICD-272.4) 15)  Hypertension  (ICD-401.9) 16)  Coronary Artery Disease  (ICD-414.00)  Current Medications (verified): 1)  Calcium 1500 Mg  Tabs (Calcium Carbonate) .... Take 1 Tablet By Mouth Once A Day 2)  Minoxidil 10 Mg  Tabs (Minoxidil) .... Take 1 1/2 Two Times A Day 3)  Prevalite 4 Gm  Pack (Cholestyramine Light) .... Take 2-3 Scoops Two Times A Day 4)  Metoprolol Tartrate 100 Mg Tabs (Metoprolol Tartrate) .... Take One Tablet By Mouth Twice A Day 5)  Tylenol Extra Strength 500 Mg  Tabs (Acetaminophen) .... Use As Needed 6)  Xanax 0.5 Mg  Tabs (Alprazolam) .... Take 1 Tablet By Mouth Three Times A Day 7)  Cozaar 100 Mg  Tabs (Losartan Potassium) .... Take 1 Tablet By Mouth Once A Day 8)  Lipitor 40 Mg Tabs (Atorvastatin Calcium) .Marland Kitchen.. 1 Tablet By Mouth Once Daily 9)  Lasix 80 Mg  Tabs (Furosemide) .... Take 1 Tablet By Mouth Two Times A Day As Needed 10)  Lantus 100 Unit/ml  Soln (Insulin Glargine) .Marland Kitchen.. 100 Units Daily 11)  Bayer Low Strength 81 Mg  Tbec (Aspirin) .... Take 1 Tablet By  Mouth Once A Day 12)  Lisinopril 20 Mg  Tabs (Lisinopril) .... Take 1 Tablet By Mouth Two Times A Day 13)  Plavix 75 Mg  Tabs (Clopidogrel Bisulfate) .... Take 1 Tablet By Mouth Once A Day 14)  Multivitamins   Tabs (Multiple Vitamin) .... Take 1 Tablet By Mouth Once A Day 15)  Vitamin B-12 1000 Mcg  Tabs (Cyanocobalamin) .... Take 1 Tablet By Mouth Once A Day 16)  Albuterol 90 Mcg/act  Aers (Albuterol) .... Inhale Two Puffs Every Four To Six Hours As Needed 17)  Ambien 10 Mg  Tabs (Zolpidem Tartrate) .... Take 1 Tab By Mouth At Bedtime As Needed 18)  Flovent Hfa 110 Mcg/act  Aero (Fluticasone Propionate  Hfa) .... Inhale Two Puffs Two Times A Day As Needed 19)  Hydrocodone-Acetaminophen 5-500 Mg  Tabs (Hydrocodone-Acetaminophen) .... Use As Needed 20)  Mobic 7.5 Mg  Tabs (Meloxicam) .... Take 1 Tablet By Mouth Two Times A Day As Needed 21)  Migquin 325-100-65mg  .... Take Two Tabs At The Onset of A Migraine and Then One Every Hour Thereafter 22)  Clonidine Hcl 0.3 Mg Tabs (Clonidine Hcl) .... Take 1 Tablet By Mouth Three Times A Day 23)  Metolazone 5 Mg Tabs (Metolazone) .... Take 1 Tablet By Mouth Once A Day 24)  Nitroglycerin 0.4 Mg Subl (Nitroglycerin) .... One Tablet Under  Tongue Every 5 Minutes As Needed For Chest Pain---May Repeat Times Three 25)  Vitamin B Complex-C   Caps (B Complex-C) .... Take 1 Capsule By Mouth Once A Day 26)  Nitro-Bid 2 % Oint (Nitroglycerin) .Marland Kitchen.. 1-2 Inches To Skin Every 6 Hours 27)  Ranitidine Hcl 150 Mg Tabs (Ranitidine Hcl) .... Take 1 Tablet By Mouth Two Times A Day 28)  Advair Diskus 500-50 Mcg/dose Aepb (Fluticasone-Salmeterol) .... One Puff Every 12 Hours 29)  Vitamin D (Ergocalciferol) 50000 Unit Caps (Ergocalciferol) .... Twice A Week 30)  Zofran 4 Mg Tabs (Ondansetron Hcl) .... Two Times A Day As Needed  Allergies (verified): 1)  ! Penicillin 2)  ! Erythromycin 3)  Amoxicillin (Amoxicillin)  Vital Signs:  Patient profile:   65 year old  female Height:      63 inches Weight:      242 pounds Pulse rate:   80 / minute Pulse rhythm:   regular BP sitting:   178 / 104  (left arm)  Vitals Entered By: Talbert Nan, CMA (December 29, 2009 3:13 PM)  Physical Exam  General:  Well developed, well nourished, in no acute distress. Head:  normocephalic and atraumatic Eyes:  PERRLA/EOM intact; conjunctiva and lids normal. Lungs:  Clear bilaterally to auscultation and percussion. Heart:  PMI non displaced.  Normal S1 and S2.  No murmur, rub, or gallop.  Abdomen:  Bowel sounds positive; abdomen soft and non-tender without masses, organomegaly, or hernias noted. No hepatosplenomegaly. Extremities:  No clubbing or cyanosis. Neurologic:  Alert and oriented x 3.   Cardiac Cath  Procedure date:  11/11/2009  Findings:      CONCLUSION: 1. Preserved overall left ventricular function with an inferobasal     akinetic segment. 2. Total occlusion of right coronary artery as previously noted. 3. Modest plaque in the left anterior descending artery, moderately     severe plaque involving the ostium of modest size diagonal branch,     and no evidence of restenosis in the previously placed circumflex     stent.   DISPOSITION:  I will see the patient back in the office on Friday.  I will continue to review her films with our cardiac surgeons to get an additional opinion.  Surgery would be somewhat complicated due to lack of critical LAD disease, her weight and size.  I would not be in favor of attempted diagonal percutaneous intervention by given the location and size of the vessel.    Impression & Recommendations:  Problem # 1:  CORONARY ARTERY DISEASE (ICD-414.00) Patient continues to have class III angina, exacerbated by uncontrolled hypertension and some diabetes.  She has seen Dr. Cyndia Bent.  Her anatomy is not ciritical, but clearly diabetic, somewhat progressive, and she has worsening angina over time.  Controlling metabolic and  reversible factors has not been easy despite optimal medical care.  HTN evaluation and treatment includes close followup by Nephrology with Dr. Lorrene Reid, as well as visit to [facility] hypertension unit for advice.  Well outlined in chart.  Agree with plans as LAD is somewhat progressive, and given BARI 2D results I would favor going ahead. Her updated medication list for this problem includes:    Metoprolol Tartrate 100 Mg Tabs (Metoprolol tartrate) .Marland Kitchen... Take one tablet by mouth twice a day    Bayer Low Strength 81 Mg Tbec (Aspirin) .Marland Kitchen... Take 1 tablet by mouth once a day    Lisinopril 20 Mg Tabs (Lisinopril) .Marland Kitchen... Take 1 tablet by mouth two times a  day    Plavix 75 Mg Tabs (Clopidogrel bisulfate) .Marland Kitchen... Take 1 tablet by mouth once a day    Nitroglycerin 0.4 Mg Subl (Nitroglycerin) ..... One tablet under tongue every 5 minutes as needed for chest pain---may repeat times three    Nitro-bid 2 % Oint (Nitroglycerin) .Marland Kitchen... 1-2 inches to skin every 6 hours  Problem # 2:  HYPERTENSION (ICD-401.9) Uncontrolled.  May be candidate for SIMPLICITY trial in the future given the challenges that she has.  Well documented in chart.  Her updated medication list for this problem includes:    Minoxidil 10 Mg Tabs (Minoxidil) .Marland Kitchen... Take 1 1/2 two times a day    Metoprolol Tartrate 100 Mg Tabs (Metoprolol tartrate) .Marland Kitchen... Take one tablet by mouth twice a day    Cozaar 100 Mg Tabs (Losartan potassium) .Marland Kitchen... Take 1 tablet by mouth once a day    Lasix 80 Mg Tabs (Furosemide) .Marland Kitchen... Take 1 tablet by mouth two times a day as needed    Bayer Low Strength 81 Mg Tbec (Aspirin) .Marland Kitchen... Take 1 tablet by mouth once a day    Lisinopril 20 Mg Tabs (Lisinopril) .Marland Kitchen... Take 1 tablet by mouth two times a day    Clonidine Hcl 0.3 Mg Tabs (Clonidine hcl) .Marland Kitchen... Take 1 tablet by mouth three times a day    Metolazone 5 Mg Tabs (Metolazone) .Marland Kitchen... Take 1 tablet by mouth once a day  Problem # 3:  DM (ICD-250.00) Followed by Dr. Sandi Mariscal. Her  updated medication list for this problem includes:    Cozaar 100 Mg Tabs (Losartan potassium) .Marland Kitchen... Take 1 tablet by mouth once a day    Lantus 100 Unit/ml Soln (Insulin glargine) .Marland KitchenMarland KitchenMarland KitchenMarland Kitchen 100 units daily    Bayer Low Strength 81 Mg Tbec (Aspirin) .Marland Kitchen... Take 1 tablet by mouth once a day    Lisinopril 20 Mg Tabs (Lisinopril) .Marland Kitchen... Take 1 tablet by mouth two times a day  Patient Instructions: 1)  Your physician recommends that you schedule a follow-up appointment in: 2 MONTHS 2)  Your physician recommends that you continue on your current medications as directed. Please refer to the Current Medication list given to you today.

## 2010-03-05 NOTE — Progress Notes (Signed)
Summary: Records Request  Faxed OV & Echo. to Eustaquio Maize (Per Baxter Flattery) at [facility] Preadmission ([id number]). [name] [name]  January 23, 2010 9:51 AM

## 2010-03-05 NOTE — Assessment & Plan Note (Signed)
Summary: f54m/eph post CABG   Visit Type:  Post-CABG Primary Provider:  Derinda Late, M.D.   History of Present Illness: Doing well.  Underwent CABG with Dr. Cyndia Bent.  Has lost 22 pounds, and is feeling slowly better, although sugars all over the place.   Dr. Sandi Mariscal is assessing.   Problems Prior to Update: 1)  Carotid Stenosis  () 2)  Papillary Muscle Dysfunction, Non-rheumatic  (ICD-429.81) 3)  Myocardial Infarction, Hx of  (ICD-412) 4)  Unspecified Vitamin D Deficiency  (ICD-268.9) 5)  Migraines, Hx of  (ICD-V13.8) 6)  Osteoarthritis  (ICD-715.90) 7)  Persistent Vomiting  (ICD-536.2) 8)  Obstructive Sleep Apnea  (ICD-327.23) 9)  Asthma  (ICD-493.90) 10)  Hx of Ventral Hernia  (ICD-553.20) 11)  Dm  (ICD-250.00) 12)  Gerd  (ICD-530.81) 13)  Obesity, Morbid  (ICD-278.01) 14)  Hyperlipidemia  (ICD-272.4) 15)  Hypertension  (ICD-401.9) 16)  Coronary Artery Disease  (ICD-414.00)  Current Medications (verified): 1)  Calcium 1500 Mg  Tabs (Calcium Carbonate) .... Take 1 Tablet By Mouth Once A Day 2)  Metoprolol Tartrate 50 Mg Tabs (Metoprolol Tartrate) .... Take One Tablet By Mouth Twice A Day 3)  Xanax 0.5 Mg  Tabs (Alprazolam) .... Take 1 Tablet By Mouth Three Times A Day 4)  Cozaar 100 Mg  Tabs (Losartan Potassium) .... Take 1 Tablet By Mouth Once A Day 5)  Lipitor 40 Mg Tabs (Atorvastatin Calcium) .Marland Kitchen.. 1 Tablet By Mouth Once Daily 6)  Furosemide 40 Mg Tabs (Furosemide) .... Take One Tablet By Mouth Daily. 7)  Lantus 100 Unit/ml  Soln (Insulin Glargine) .... 50 Units Daily 8)  Aspirin Ec 325 Mg Tbec (Aspirin) .... Take One Tablet By Mouth Daily 9)  Plavix 75 Mg  Tabs (Clopidogrel Bisulfate) .... Take 1 Tablet By Mouth Once A Day 10)  Multivitamins   Tabs (Multiple Vitamin) .... Take 1 Tablet By Mouth Once A Day 11)  Vitamin B-12 1000 Mcg  Tabs (Cyanocobalamin) .... Take 1 Tablet By Mouth Once A Day 12)  Albuterol 90 Mcg/act  Aers (Albuterol) .... Inhale Two Puffs Every Four To  Six Hours As Needed 13)  Ambien 10 Mg  Tabs (Zolpidem Tartrate) .... Take 1 Tab By Mouth At Bedtime As Needed 14)  Flovent Hfa 110 Mcg/act  Aero (Fluticasone Propionate  Hfa) .... Inhale Two Puffs Two Times A Day As Needed 15)  Hydrocodone-Acetaminophen 5-500 Mg  Tabs (Hydrocodone-Acetaminophen) .... Use As Needed 16)  Mobic 7.5 Mg  Tabs (Meloxicam) .... Take 1 Tablet By Mouth Two Times A Day As Needed 17)  Migquin 325-100-65mg  .... Take Two Tabs At The Onset of A Migraine and Then One Every Hour Thereafter 18)  Vitamin B Complex-C   Caps (B Complex-C) .... Take 1 Capsule By Mouth Once A Day 19)  Ranitidine Hcl 150 Mg Tabs (Ranitidine Hcl) .... Take 1 Tablet By Mouth Two Times A Day 20)  Advair Diskus 500-50 Mcg/dose Aepb (Fluticasone-Salmeterol) .... One Puff Every 12 Hours 21)  Vitamin D (Ergocalciferol) 50000 Unit Caps (Ergocalciferol) .... Twice A Week 22)  Zofran 4 Mg Tabs (Ondansetron Hcl) .... Two Times A Day As Needed  Allergies: 1)  ! Penicillin 2)  ! Erythromycin 3)  ! Glucophage 4)  Amoxicillin (Amoxicillin)  Past History:  Past Medical History: Last updated: 03/31/2008 Current Problems:  MIGRAINES, HX OF (ICD-V13.8) OSTEOARTHRITIS (ICD-715.90) PERSISTENT VOMITING (ICD-536.2) OBSTRUCTIVE SLEEP APNEA (ICD-327.23) ASTHMA (ICD-493.90) Hx of VENTRAL HERNIA (ICD-553.20) DM (ICD-250.00) GERD (ICD-530.81) OBESITY, MORBID (ICD-278.01) HYPERLIPIDEMIA (ICD-272.4) HYPERTENSION (ICD-401.9),  severe CORONARY ARTERY DISEASE (ICD-414.00)  Past Surgical History: CABG  01/2010 percutanerous coronary intervention gastric stapling-1970 Ovarian tumor removal right leg surgery- rod and 2 pins placed Hysterectomy hernia repair/lysis of adhesions Angioplasty -multiple times cholecystectomy  Vital Signs:  Patient profile:   65 year old female Height:      63 inches Weight:      222.75 pounds BMI:     39.60 Pulse rate:   60 / minute Pulse rhythm:   regular Resp:     18 per  minute BP sitting:   140 / 80  (left arm) Cuff size:   large  Vitals Entered By: Sidney Ace (February 23, 2010 2:19 PM)  Physical Exam  General:  Well developed, well nourished, in no acute distress. Head:  normocephalic and atraumatic Eyes:  PERRLA/EOM intact; conjunctiva and lids normal. Lungs:  Clear bilaterally to auscultation and percussion. Heart:  Reg rhythm.  S2 normal.  No sig murmur. Extremities:  Healing from CABG. Neurologic:  Alert and oriented x 3.   EKG  Procedure date:  02/23/2010  Findings:      NSR.  LVH with repolarization changes.  Impression & Recommendations:  Problem # 1:  CORONARY ARTERY DISEASE (ICD-414.00)  recent cabg, doing well.  The following medications were removed from the medication list:    Lisinopril 20 Mg Tabs (Lisinopril) .Marland Kitchen... Take 1 tablet by mouth two times a day    Nitroglycerin 0.4 Mg Subl (Nitroglycerin) ..... One tablet under tongue every 5 minutes as needed for chest pain---may repeat times three    Nitro-bid 2 % Oint (Nitroglycerin) .Marland Kitchen... 1-2 inches to skin every 6 hours Her updated medication list for this problem includes:    Metoprolol Tartrate 50 Mg Tabs (Metoprolol tartrate) .Marland Kitchen... Take one tablet by mouth twice a day    Aspirin Ec 325 Mg Tbec (Aspirin) .Marland Kitchen... Take one tablet by mouth daily    Plavix 75 Mg Tabs (Clopidogrel bisulfate) .Marland Kitchen... Take 1 tablet by mouth once a day  The following medications were removed from the medication list:    Lisinopril 20 Mg Tabs (Lisinopril) .Marland Kitchen... Take 1 tablet by mouth two times a day    Nitroglycerin 0.4 Mg Subl (Nitroglycerin) ..... One tablet under tongue every 5 minutes as needed for chest pain---may repeat times three    Nitro-bid 2 % Oint (Nitroglycerin) .Marland Kitchen... 1-2 inches to skin every 6 hours Her updated medication list for this problem includes:    Metoprolol Tartrate 50 Mg Tabs (Metoprolol tartrate) .Marland Kitchen... Take one tablet by mouth twice a day    Aspirin Ec 325 Mg Tbec (Aspirin)  .Marland Kitchen... Take one tablet by mouth daily    Plavix 75 Mg Tabs (Clopidogrel bisulfate) .Marland Kitchen... Take 1 tablet by mouth once a day  Orders: EKG w/ Interpretation (93000)  Problem # 2:  HYPERTENSION (ICD-401.9)  best it has been in awhile.  Down twenty pounds, and Bp controlled.  The following medications were removed from the medication list:    Minoxidil 10 Mg Tabs (Minoxidil) .Marland Kitchen... Take 1 1/2 two times a day    Lisinopril 20 Mg Tabs (Lisinopril) .Marland Kitchen... Take 1 tablet by mouth two times a day    Clonidine Hcl 0.3 Mg Tabs (Clonidine hcl) .Marland Kitchen... Take 1 tablet by mouth three times a day    Metolazone 5 Mg Tabs (Metolazone) .Marland Kitchen... Take 1 tablet by mouth once a day Her updated medication list for this problem includes:    Metoprolol Tartrate 50 Mg Tabs (Metoprolol tartrate) .Marland KitchenMarland KitchenMarland KitchenMarland Kitchen  Take one tablet by mouth twice a day    Cozaar 100 Mg Tabs (Losartan potassium) .Marland Kitchen... Take 1 tablet by mouth once a day    Furosemide 40 Mg Tabs (Furosemide) .Marland Kitchen... Take one tablet by mouth daily.    Aspirin Ec 325 Mg Tbec (Aspirin) .Marland Kitchen... Take one tablet by mouth daily  The following medications were removed from the medication list:    Minoxidil 10 Mg Tabs (Minoxidil) .Marland Kitchen... Take 1 1/2 two times a day    Lisinopril 20 Mg Tabs (Lisinopril) .Marland Kitchen... Take 1 tablet by mouth two times a day    Clonidine Hcl 0.3 Mg Tabs (Clonidine hcl) .Marland Kitchen... Take 1 tablet by mouth three times a day    Metolazone 5 Mg Tabs (Metolazone) .Marland Kitchen... Take 1 tablet by mouth once a day Her updated medication list for this problem includes:    Metoprolol Tartrate 50 Mg Tabs (Metoprolol tartrate) .Marland Kitchen... Take one tablet by mouth twice a day    Cozaar 100 Mg Tabs (Losartan potassium) .Marland Kitchen... Take 1 tablet by mouth once a day    Furosemide 40 Mg Tabs (Furosemide) .Marland Kitchen... Take one tablet by mouth daily.    Aspirin Ec 325 Mg Tbec (Aspirin) .Marland Kitchen... Take one tablet by mouth daily  Problem # 3:  HYPERLIPIDEMIA (ICD-272.4)  might be candidate for HPS 3.  The following medications  were removed from the medication list:    Prevalite 4 Gm Pack (Cholestyramine light) .Marland Kitchen... Take 2-3 scoops two times a day Her updated medication list for this problem includes:    Lipitor 40 Mg Tabs (Atorvastatin calcium) .Marland Kitchen... 1 tablet by mouth once daily  The following medications were removed from the medication list:    Prevalite 4 Gm Pack (Cholestyramine light) .Marland Kitchen... Take 2-3 scoops two times a day Her updated medication list for this problem includes:    Lipitor 40 Mg Tabs (Atorvastatin calcium) .Marland Kitchen... 1 tablet by mouth once daily  Problem # 4:  DM (ICD-250.00) Being managed by Dr. Sandi Mariscal The following medications were removed from the medication list:    Lisinopril 20 Mg Tabs (Lisinopril) .Marland Kitchen... Take 1 tablet by mouth two times a day Her updated medication list for this problem includes:    Cozaar 100 Mg Tabs (Losartan potassium) .Marland Kitchen... Take 1 tablet by mouth once a day    Lantus 100 Unit/ml Soln (Insulin glargine) .Marland KitchenMarland KitchenMarland KitchenMarland Kitchen 50 units daily    Aspirin Ec 325 Mg Tbec (Aspirin) .Marland Kitchen... Take one tablet by mouth daily  Patient Instructions: 1)  Your physician recommends that you continue on your current medications as directed. Please refer to the Current Medication list given to you today. 2)  Your physician wants you to follow-up in:  3 MONTHS.  You will receive a reminder letter in the mail two months in advance. If you don't receive a letter, please call our office to schedule the follow-up appointment.

## 2010-03-11 NOTE — Letter (Signed)
Summary: [facility]  [facility] Program   Imported By: Marilynne Drivers 03/05/2010 11:52:40  _____________________________________________________________________  External Attachment:    Type:   Image     Comment:   External Document

## 2010-03-12 ENCOUNTER — Telehealth (INDEPENDENT_AMBULATORY_CARE_PROVIDER_SITE_OTHER): Payer: Self-pay | Admitting: *Deleted

## 2010-03-16 ENCOUNTER — Other Ambulatory Visit: Payer: Self-pay

## 2010-03-16 ENCOUNTER — Encounter (HOSPITAL_COMMUNITY): Payer: 59 | Attending: Cardiology

## 2010-03-16 ENCOUNTER — Telehealth (INDEPENDENT_AMBULATORY_CARE_PROVIDER_SITE_OTHER): Payer: Self-pay | Admitting: Physician Assistant

## 2010-03-16 ENCOUNTER — Encounter: Payer: Self-pay | Admitting: Cardiology

## 2010-03-16 DIAGNOSIS — E119 Type 2 diabetes mellitus without complications: Secondary | ICD-10-CM | POA: Insufficient documentation

## 2010-03-16 DIAGNOSIS — Z9861 Coronary angioplasty status: Secondary | ICD-10-CM | POA: Insufficient documentation

## 2010-03-16 DIAGNOSIS — Z880 Allergy status to penicillin: Secondary | ICD-10-CM | POA: Insufficient documentation

## 2010-03-16 DIAGNOSIS — E785 Hyperlipidemia, unspecified: Secondary | ICD-10-CM | POA: Insufficient documentation

## 2010-03-16 DIAGNOSIS — G4733 Obstructive sleep apnea (adult) (pediatric): Secondary | ICD-10-CM | POA: Insufficient documentation

## 2010-03-16 DIAGNOSIS — K219 Gastro-esophageal reflux disease without esophagitis: Secondary | ICD-10-CM | POA: Insufficient documentation

## 2010-03-16 DIAGNOSIS — I10 Essential (primary) hypertension: Secondary | ICD-10-CM | POA: Insufficient documentation

## 2010-03-16 DIAGNOSIS — Z7902 Long term (current) use of antithrombotics/antiplatelets: Secondary | ICD-10-CM | POA: Insufficient documentation

## 2010-03-16 DIAGNOSIS — I2510 Atherosclerotic heart disease of native coronary artery without angina pectoris: Secondary | ICD-10-CM | POA: Insufficient documentation

## 2010-03-16 DIAGNOSIS — I252 Old myocardial infarction: Secondary | ICD-10-CM | POA: Insufficient documentation

## 2010-03-16 DIAGNOSIS — Z5189 Encounter for other specified aftercare: Secondary | ICD-10-CM | POA: Insufficient documentation

## 2010-03-16 DIAGNOSIS — I472 Ventricular tachycardia, unspecified: Secondary | ICD-10-CM | POA: Insufficient documentation

## 2010-03-16 DIAGNOSIS — I2582 Chronic total occlusion of coronary artery: Secondary | ICD-10-CM | POA: Insufficient documentation

## 2010-03-16 DIAGNOSIS — I4729 Other ventricular tachycardia: Secondary | ICD-10-CM | POA: Insufficient documentation

## 2010-03-16 DIAGNOSIS — Z951 Presence of aortocoronary bypass graft: Secondary | ICD-10-CM | POA: Insufficient documentation

## 2010-03-16 DIAGNOSIS — Z794 Long term (current) use of insulin: Secondary | ICD-10-CM | POA: Insufficient documentation

## 2010-03-16 DIAGNOSIS — J45909 Unspecified asthma, uncomplicated: Secondary | ICD-10-CM | POA: Insufficient documentation

## 2010-03-16 DIAGNOSIS — Z7982 Long term (current) use of aspirin: Secondary | ICD-10-CM | POA: Insufficient documentation

## 2010-03-16 DIAGNOSIS — Z79899 Other long term (current) drug therapy: Secondary | ICD-10-CM | POA: Insufficient documentation

## 2010-03-16 LAB — GLUCOSE, CAPILLARY
Glucose-Capillary: 185 mg/dL — ABNORMAL HIGH (ref 70–99)
Glucose-Capillary: 183 mg/dL — ABNORMAL HIGH (ref 70–99)

## 2010-03-17 ENCOUNTER — Encounter (INDEPENDENT_AMBULATORY_CARE_PROVIDER_SITE_OTHER): Payer: Self-pay

## 2010-03-17 DIAGNOSIS — I2510 Atherosclerotic heart disease of native coronary artery without angina pectoris: Secondary | ICD-10-CM

## 2010-03-18 ENCOUNTER — Other Ambulatory Visit: Payer: Self-pay

## 2010-03-18 ENCOUNTER — Encounter: Payer: Self-pay | Admitting: Cardiology

## 2010-03-18 ENCOUNTER — Encounter (HOSPITAL_COMMUNITY): Payer: 59

## 2010-03-18 LAB — GLUCOSE, CAPILLARY
Glucose-Capillary: 185 mg/dL — ABNORMAL HIGH (ref 70–99)
Glucose-Capillary: 156 mg/dL — ABNORMAL HIGH (ref 70–99)

## 2010-03-18 NOTE — Assessment & Plan Note (Unsigned)
OFFICE VISIT  KARINGTON, BIRDSONG DOB:  01-24-1946                                        February 23, 2010 CHART #:  [id number]  REASON FOR OFFICE VISIT:  Routine followup status post CABG.  HISTORY OF PRESENT ILLNESS:  This is a 65 year old Caucasian female with a history of coronary artery disease (status post MI, multiple percutaneous coronary intervention with stents), hypertension, diabetes mellitus, hyperlipidemia, obesity, and OSA who underwent a cardiac catheterization and was found to have multivessel coronary artery disease.  The patient underwent a CABG x5 (LIMA to LAD, SVG to diagonal, SVG sequentially to obtuse marginal branches, and saphenous vein graft to the posterior coronary artery) by Dr. Cyndia Bent on January 28, 2010. The patient had a fairly uneventful postoperative course.  It should be noted her hemoglobin A1c preoperatively was 9.  Her sugars were well controlled prior to her discharge.  Her only complaint at this time is almost every evening prior to going to bed she has constant leg movement and this worsens when she gets into bed.  She denies any chest pain, shortness of breath, fever, or chills.  PHYSICAL EXAMINATION:  General:  This is a pleasant 65 year old Caucasian female, who is in no acute distress, who is alert, oriented, and cooperative.  Vital Signs:  As follows.  BP 126/98, heart rate 64, respiration rate 20, and O2 sat 97% on room air.  Cardiovascular: Regular rate and rhythm.  S1 and S2 without any murmurs, gallops, or rubs.  Pulmonary:  Clear to auscultation bilaterally.  No rales, wheezes, or rhonchi.  Abdomen:  Soft and nontender.  Bowel sounds present.  Extremities:  No cyanosis or clubbing.  Trace edema bilaterally.  Wounds:  Sternal wound is clean, dry, and well healed. There is a suture slightly lower than the midline that was removed without difficulty.  Right and left lower extremity wounds are clean, dry, and  well healed.  DIAGNOSTIC TESTS:  Chest x-ray done today shows no pneumothorax.  No vascular congestion or pleural effusions.  Clear lungs.  IMPRESSION AND PLAN: 1. Overall, the patient is surgically stable status post coronary     artery bypass graft x5.  She has an appointment to see Dr. [name]     today.  She is currently on lisinopril 20 mg p.o. daily as well as     Lopressor 50 mg p.o. 2 times daily, Plavix 75 mg p.o. daily, and     enteric-coated aspirin 325 mg p.o. daily.  As stated above, her     heart rate is in the 60s and she may need adjustments to one of her     medications, but we would defer this to Dr. [name]. 2. The patient instructed providing she is not taking any narcotics.     She may begin driving short distances (i.e., 30 minutes or less     during the day), this is to continue for the next week and she may     gradually increase her frequency and duration as tolerates. 3. The patient was encouraged to participate in cardiac rehab which     she is agreeable to. 4. The patient was instructed to continue with sternal precautions,     i.e., no lifting more than 10 pounds for the next 3-4 weeks. 5. Regarding the patient's diabetes mellitus, she does see  Dr.     Sandi Mariscal.  He has stopped her metformin and she currently is taking     Lantus 50 units subcu daily.  He is obtaining blood work and the     patient has been keeping a record of her sugars and he is going to     continue to manage her diabetes. 6. Finally, the patient complains of what appears to be perhaps     restless legs syndrome.  She was instructed that if in fact she     does have this, there is medical treatment available.  I have     instructed her to contact her medical doctor regarding further     followup for this.  Lars Pinks, PA  DZ/MEDQ  D:  02/23/2010  T:  02/24/2010  Job:  [id number]  cc:   [name]. [name], MD, [facility] Derinda Late, M.D.

## 2010-03-18 NOTE — Assessment & Plan Note (Signed)
OFFICE VISIT  KIARE, HOTT DOB:  [birth year]                                        March 17, 2010 CHART #:  [id number]  HISTORY:  The patient comes in today for a check of her sternal incision.  She has been doing well overall status post coronary artery bypass grafting x5 on January 28, 2010.  However, over the past several days, she has noticed some thin watery drainage from the lower portion of her sternal incision, just at the level of her bra strap.  She has noted some mild tenderness in this area, but no significant pain and denies specifically any fevers, chills, or purulence.  She saw Dr. Lorrene Reid this week back for a routine followup and was given a prescription for Keflex 500 mg t.i.d. x5 days and was asked to come in to have this area evaluated in our office.  She is otherwise doing very well.  She started cardiac rehab and is progressing well.  She continues to have some problems with her blood sugars being labile, but this is being followed closely by Dr. Sandi Mariscal.  She also has been seen by Dr. [name] and has been scheduled for a 65-month followup there.  PHYSICAL EXAMINATION:  Vital Signs:  Blood pressure is 169/78, pulse is 64, respirations 16, and O2 sat 97% on room air.  Chest:  Evaluation of her sternal wound reveals a small 0.5-cm area at the lower portion of her sternal wound which appears abraded.  There is no separation of the incision and no drainage is expressible at this time.  It is minimally erythematous with no surrounding erythema and no tenderness.  ASSESSMENT/PLAN:  This area appears to be more of a friction-type abrasion along the incision site than cellulitis at this point. However, in light of her brittle diabetes, I have recommended that she continue with Keflex that Dr. Lorrene Reid have prescribed.  She is encouraged to keep a dry gauze dressing under her bra strap to prevent any further chafing in the area.  She is  also asked to keep the area clean and dry daily with soap and water.  She will call if she develops any fevers, chills, or increased erythema or purulent drainage from the area.  We will see her back p.r.n.  Suzzanne Cloud, P.A.  GC/MEDQ  D:  03/17/2010  T:  03/18/2010  Job:  [id number]  cc:   [name]. [name], MD, [place] Lorrene Reid, M.D. Derinda Late, M.D.

## 2010-03-19 NOTE — Progress Notes (Signed)
Summary: Records Request  Faxed OV & EKG to [facility] ([id number]).  Ranell Patrick  March 12, 2010 12:26 PM

## 2010-03-20 ENCOUNTER — Other Ambulatory Visit: Payer: Self-pay

## 2010-03-20 ENCOUNTER — Encounter (HOSPITAL_COMMUNITY): Payer: 59

## 2010-03-23 ENCOUNTER — Other Ambulatory Visit: Payer: Self-pay

## 2010-03-23 ENCOUNTER — Encounter (HOSPITAL_COMMUNITY): Payer: 59

## 2010-03-23 LAB — GLUCOSE, CAPILLARY
Glucose-Capillary: 184 mg/dL — ABNORMAL HIGH (ref 70–99)
Glucose-Capillary: 167 mg/dL — ABNORMAL HIGH (ref 70–99)
Glucose-Capillary: 127 mg/dL — ABNORMAL HIGH (ref 70–99)
Glucose-Capillary: 74 mg/dL (ref 70–99)
Glucose-Capillary: 177 mg/dL — ABNORMAL HIGH (ref 70–99)

## 2010-03-25 ENCOUNTER — Other Ambulatory Visit: Payer: Self-pay

## 2010-03-25 ENCOUNTER — Encounter (HOSPITAL_COMMUNITY): Payer: 59

## 2010-03-25 LAB — GLUCOSE, CAPILLARY
Glucose-Capillary: 196 mg/dL — ABNORMAL HIGH (ref 70–99)
Glucose-Capillary: 186 mg/dL — ABNORMAL HIGH (ref 70–99)

## 2010-03-25 NOTE — Letter (Signed)
Summary: [facility] Patient Note   [facility] Patient Note   Imported By: Sallee Provencal 03/16/2010 12:06:33  _____________________________________________________________________  External Attachment:    Type:   Image     Comment:   External Document

## 2010-03-25 NOTE — Letter (Signed)
Summary: [facility] Patient Note   [facility] Patient Note   Imported By: Sallee Provencal 03/19/2010 12:43:49  _____________________________________________________________________  External Attachment:    Type:   Image     Comment:   External Document

## 2010-03-25 NOTE — Progress Notes (Signed)
Summary: Patient Medical History   Patient Medical History   Imported By: Sallee Provencal 03/18/2010 14:28:18  _____________________________________________________________________  External Attachment:    Type:   Image     Comment:   External Document

## 2010-03-25 NOTE — Progress Notes (Signed)
  Phone Note From Other Clinic   Caller: Cardiac Rehab Call For: Dr. Sherren Mocha Summary of Call: Pt in cardiac rehab having bigeminal PVC's. Verdis Frederickson was concerned because pt is on furosemide and was on metolazone as well, without potassium supp. Has appt w/ Dr Lorrene Reid today, who follows her for HTN and proteinuria. Ordered BMET and Mg, can be done at Dr Sanda Klein ofc if pt would like. O/W cont. all Rx and keep f/u as scheduled.  Initial call taken by: Davis Gourd Legaci Tarman PA-C,  March 16, 2010 11:13 AM

## 2010-03-25 NOTE — Progress Notes (Signed)
Summary: [facility]   [facility] Blood Sugar [facility]   Imported By: Sallee Provencal 03/17/2010 16:23:11  _____________________________________________________________________  External Attachment:    Type:   Image     Comment:   External Document

## 2010-03-27 ENCOUNTER — Other Ambulatory Visit: Payer: Self-pay

## 2010-03-27 ENCOUNTER — Encounter (HOSPITAL_COMMUNITY): Payer: 59

## 2010-03-30 ENCOUNTER — Encounter (HOSPITAL_COMMUNITY): Payer: 59

## 2010-03-30 LAB — GLUCOSE, CAPILLARY
Glucose-Capillary: 170 mg/dL — ABNORMAL HIGH (ref 70–99)
Glucose-Capillary: 146 mg/dL — ABNORMAL HIGH (ref 70–99)

## 2010-04-01 ENCOUNTER — Encounter (HOSPITAL_COMMUNITY): Payer: 59

## 2010-04-03 ENCOUNTER — Encounter (HOSPITAL_COMMUNITY): Payer: 59

## 2010-04-06 ENCOUNTER — Other Ambulatory Visit: Payer: Self-pay

## 2010-04-06 ENCOUNTER — Encounter (HOSPITAL_COMMUNITY): Payer: 59 | Attending: Cardiology

## 2010-04-06 DIAGNOSIS — Z5189 Encounter for other specified aftercare: Secondary | ICD-10-CM | POA: Insufficient documentation

## 2010-04-06 DIAGNOSIS — Z951 Presence of aortocoronary bypass graft: Secondary | ICD-10-CM | POA: Insufficient documentation

## 2010-04-06 DIAGNOSIS — K219 Gastro-esophageal reflux disease without esophagitis: Secondary | ICD-10-CM | POA: Insufficient documentation

## 2010-04-06 DIAGNOSIS — J45909 Unspecified asthma, uncomplicated: Secondary | ICD-10-CM | POA: Insufficient documentation

## 2010-04-06 DIAGNOSIS — I4729 Other ventricular tachycardia: Secondary | ICD-10-CM | POA: Insufficient documentation

## 2010-04-06 DIAGNOSIS — I2582 Chronic total occlusion of coronary artery: Secondary | ICD-10-CM | POA: Insufficient documentation

## 2010-04-06 DIAGNOSIS — Z880 Allergy status to penicillin: Secondary | ICD-10-CM | POA: Insufficient documentation

## 2010-04-06 DIAGNOSIS — E785 Hyperlipidemia, unspecified: Secondary | ICD-10-CM | POA: Insufficient documentation

## 2010-04-06 DIAGNOSIS — Z7982 Long term (current) use of aspirin: Secondary | ICD-10-CM | POA: Insufficient documentation

## 2010-04-06 DIAGNOSIS — I2510 Atherosclerotic heart disease of native coronary artery without angina pectoris: Secondary | ICD-10-CM | POA: Insufficient documentation

## 2010-04-06 DIAGNOSIS — G4733 Obstructive sleep apnea (adult) (pediatric): Secondary | ICD-10-CM | POA: Insufficient documentation

## 2010-04-06 DIAGNOSIS — Z794 Long term (current) use of insulin: Secondary | ICD-10-CM | POA: Insufficient documentation

## 2010-04-06 DIAGNOSIS — I252 Old myocardial infarction: Secondary | ICD-10-CM | POA: Insufficient documentation

## 2010-04-06 DIAGNOSIS — Z9861 Coronary angioplasty status: Secondary | ICD-10-CM | POA: Insufficient documentation

## 2010-04-06 DIAGNOSIS — Z7902 Long term (current) use of antithrombotics/antiplatelets: Secondary | ICD-10-CM | POA: Insufficient documentation

## 2010-04-06 DIAGNOSIS — I10 Essential (primary) hypertension: Secondary | ICD-10-CM | POA: Insufficient documentation

## 2010-04-06 DIAGNOSIS — Z79899 Other long term (current) drug therapy: Secondary | ICD-10-CM | POA: Insufficient documentation

## 2010-04-06 DIAGNOSIS — I472 Ventricular tachycardia, unspecified: Secondary | ICD-10-CM | POA: Insufficient documentation

## 2010-04-06 DIAGNOSIS — E119 Type 2 diabetes mellitus without complications: Secondary | ICD-10-CM | POA: Insufficient documentation

## 2010-04-06 LAB — GLUCOSE, CAPILLARY: Glucose-Capillary: 194 mg/dL — ABNORMAL HIGH (ref 70–99)

## 2010-04-08 ENCOUNTER — Other Ambulatory Visit: Payer: Self-pay

## 2010-04-08 ENCOUNTER — Encounter (HOSPITAL_COMMUNITY): Payer: 59

## 2010-04-08 LAB — GLUCOSE, CAPILLARY
Glucose-Capillary: 167 mg/dL — ABNORMAL HIGH (ref 70–99)
Glucose-Capillary: 140 mg/dL — ABNORMAL HIGH (ref 70–99)

## 2010-04-10 ENCOUNTER — Encounter (HOSPITAL_COMMUNITY): Payer: 59

## 2010-04-13 ENCOUNTER — Encounter (HOSPITAL_COMMUNITY): Payer: 59

## 2010-04-13 LAB — POCT I-STAT 3, ART BLOOD GAS (G3+)
Acid-base deficit: 1 mmol/L (ref 0.0–2.0)
Acid-base deficit: 1 mmol/L (ref 0.0–2.0)
Acid-base deficit: 2 mmol/L (ref 0.0–2.0)
Acid-base deficit: 3 mmol/L — ABNORMAL HIGH (ref 0.0–2.0)
Bicarbonate: 23.3 mEq/L (ref 20.0–24.0)
Bicarbonate: 23.9 mEq/L (ref 20.0–24.0)
Bicarbonate: 24.5 mEq/L — ABNORMAL HIGH (ref 20.0–24.0)
Bicarbonate: 24.6 mEq/L — ABNORMAL HIGH (ref 20.0–24.0)
O2 Saturation: 100 %
O2 Saturation: 100 %
O2 Saturation: 97 %
O2 Saturation: 98 %
Patient temperature: 35.9
Patient temperature: 36.9
TCO2: 25 mmol/L (ref 0–100)
TCO2: 25 mmol/L (ref 0–100)
TCO2: 26 mmol/L (ref 0–100)
TCO2: 26 mmol/L (ref 0–100)
pCO2 arterial: 41.2 mmHg (ref 35.0–45.0)
pCO2 arterial: 41.8 mmHg (ref 35.0–45.0)
pCO2 arterial: 42.5 mmHg (ref 35.0–45.0)
pCO2 arterial: 44.1 mmHg (ref 35.0–45.0)
pH, Arterial: 7.341 — ABNORMAL LOW (ref 7.350–7.400)
pH, Arterial: 7.35 (ref 7.350–7.400)
pH, Arterial: 7.37 (ref 7.350–7.400)
pH, Arterial: 7.383 (ref 7.350–7.400)
pO2, Arterial: 113 mmHg — ABNORMAL HIGH (ref 80.0–100.0)
pO2, Arterial: 204 mmHg — ABNORMAL HIGH (ref 80.0–100.0)
pO2, Arterial: 393 mmHg — ABNORMAL HIGH (ref 80.0–100.0)
pO2, Arterial: 86 mmHg (ref 80.0–100.0)

## 2010-04-13 LAB — POCT I-STAT 4, (NA,K, GLUC, HGB,HCT)
Glucose, Bld: 119 mg/dL — ABNORMAL HIGH (ref 70–99)
Glucose, Bld: 127 mg/dL — ABNORMAL HIGH (ref 70–99)
Glucose, Bld: 140 mg/dL — ABNORMAL HIGH (ref 70–99)
Glucose, Bld: 146 mg/dL — ABNORMAL HIGH (ref 70–99)
Glucose, Bld: 159 mg/dL — ABNORMAL HIGH (ref 70–99)
Glucose, Bld: 166 mg/dL — ABNORMAL HIGH (ref 70–99)
Glucose, Bld: 183 mg/dL — ABNORMAL HIGH (ref 70–99)
HCT: 29 % — ABNORMAL LOW (ref 36.0–46.0)
HCT: 29 % — ABNORMAL LOW (ref 36.0–46.0)
HCT: 31 % — ABNORMAL LOW (ref 36.0–46.0)
HCT: 31 % — ABNORMAL LOW (ref 36.0–46.0)
HCT: 36 % (ref 36.0–46.0)
HCT: 38 % (ref 36.0–46.0)
HCT: 39 % (ref 36.0–46.0)
Hemoglobin: 10.5 g/dL — ABNORMAL LOW (ref 12.0–15.0)
Hemoglobin: 10.5 g/dL — ABNORMAL LOW (ref 12.0–15.0)
Hemoglobin: 12.2 g/dL (ref 12.0–15.0)
Hemoglobin: 12.9 g/dL (ref 12.0–15.0)
Hemoglobin: 13.3 g/dL (ref 12.0–15.0)
Hemoglobin: 9.9 g/dL — ABNORMAL LOW (ref 12.0–15.0)
Hemoglobin: 9.9 g/dL — ABNORMAL LOW (ref 12.0–15.0)
Potassium: 3.3 mEq/L — ABNORMAL LOW (ref 3.5–5.1)
Potassium: 3.4 mEq/L — ABNORMAL LOW (ref 3.5–5.1)
Potassium: 3.5 mEq/L (ref 3.5–5.1)
Potassium: 3.5 mEq/L (ref 3.5–5.1)
Potassium: 3.6 mEq/L (ref 3.5–5.1)
Potassium: 4.1 mEq/L (ref 3.5–5.1)
Potassium: 4.4 mEq/L (ref 3.5–5.1)
Sodium: 137 mEq/L (ref 135–145)
Sodium: 137 mEq/L (ref 135–145)
Sodium: 137 mEq/L (ref 135–145)
Sodium: 140 mEq/L (ref 135–145)
Sodium: 141 mEq/L (ref 135–145)
Sodium: 141 mEq/L (ref 135–145)
Sodium: 142 mEq/L (ref 135–145)

## 2010-04-13 LAB — BLOOD GAS, ARTERIAL
Acid-base deficit: 1.3 mmol/L (ref 0.0–2.0)
Bicarbonate: 22.5 mEq/L (ref 20.0–24.0)
Drawn by: 181601
FIO2: 0.21 %
O2 Saturation: 98.5 %
Patient temperature: 98.6
TCO2: 23.6 mmol/L (ref 0–100)
pCO2 arterial: 34.8 mmHg — ABNORMAL LOW (ref 35.0–45.0)
pH, Arterial: 7.426 — ABNORMAL HIGH (ref 7.350–7.400)
pO2, Arterial: 105 mmHg — ABNORMAL HIGH (ref 80.0–100.0)

## 2010-04-13 LAB — GLUCOSE, CAPILLARY
Glucose-Capillary: 102 mg/dL — ABNORMAL HIGH (ref 70–99)
Glucose-Capillary: 103 mg/dL — ABNORMAL HIGH (ref 70–99)
Glucose-Capillary: 111 mg/dL — ABNORMAL HIGH (ref 70–99)
Glucose-Capillary: 115 mg/dL — ABNORMAL HIGH (ref 70–99)
Glucose-Capillary: 115 mg/dL — ABNORMAL HIGH (ref 70–99)
Glucose-Capillary: 121 mg/dL — ABNORMAL HIGH (ref 70–99)
Glucose-Capillary: 124 mg/dL — ABNORMAL HIGH (ref 70–99)
Glucose-Capillary: 130 mg/dL — ABNORMAL HIGH (ref 70–99)
Glucose-Capillary: 132 mg/dL — ABNORMAL HIGH (ref 70–99)
Glucose-Capillary: 136 mg/dL — ABNORMAL HIGH (ref 70–99)
Glucose-Capillary: 136 mg/dL — ABNORMAL HIGH (ref 70–99)
Glucose-Capillary: 138 mg/dL — ABNORMAL HIGH (ref 70–99)
Glucose-Capillary: 141 mg/dL — ABNORMAL HIGH (ref 70–99)
Glucose-Capillary: 143 mg/dL — ABNORMAL HIGH (ref 70–99)
Glucose-Capillary: 148 mg/dL — ABNORMAL HIGH (ref 70–99)
Glucose-Capillary: 153 mg/dL — ABNORMAL HIGH (ref 70–99)
Glucose-Capillary: 154 mg/dL — ABNORMAL HIGH (ref 70–99)
Glucose-Capillary: 154 mg/dL — ABNORMAL HIGH (ref 70–99)
Glucose-Capillary: 157 mg/dL — ABNORMAL HIGH (ref 70–99)
Glucose-Capillary: 158 mg/dL — ABNORMAL HIGH (ref 70–99)
Glucose-Capillary: 163 mg/dL — ABNORMAL HIGH (ref 70–99)
Glucose-Capillary: 164 mg/dL — ABNORMAL HIGH (ref 70–99)
Glucose-Capillary: 181 mg/dL — ABNORMAL HIGH (ref 70–99)
Glucose-Capillary: 195 mg/dL — ABNORMAL HIGH (ref 70–99)
Glucose-Capillary: 195 mg/dL — ABNORMAL HIGH (ref 70–99)
Glucose-Capillary: 197 mg/dL — ABNORMAL HIGH (ref 70–99)
Glucose-Capillary: 198 mg/dL — ABNORMAL HIGH (ref 70–99)
Glucose-Capillary: 201 mg/dL — ABNORMAL HIGH (ref 70–99)
Glucose-Capillary: 208 mg/dL — ABNORMAL HIGH (ref 70–99)
Glucose-Capillary: 216 mg/dL — ABNORMAL HIGH (ref 70–99)
Glucose-Capillary: 235 mg/dL — ABNORMAL HIGH (ref 70–99)
Glucose-Capillary: 36 mg/dL — CL (ref 70–99)
Glucose-Capillary: 65 mg/dL — ABNORMAL LOW (ref 70–99)
Glucose-Capillary: 75 mg/dL (ref 70–99)
Glucose-Capillary: 76 mg/dL (ref 70–99)
Glucose-Capillary: 82 mg/dL (ref 70–99)
Glucose-Capillary: 86 mg/dL (ref 70–99)
Glucose-Capillary: 90 mg/dL (ref 70–99)
Glucose-Capillary: 93 mg/dL (ref 70–99)

## 2010-04-13 LAB — HEMOGLOBIN A1C
Hgb A1c MFr Bld: 9 % — ABNORMAL HIGH (ref <5.7)
Mean Plasma Glucose: 212 mg/dL — ABNORMAL HIGH (ref <117)

## 2010-04-13 LAB — CBC
HCT: 33.3 % — ABNORMAL LOW (ref 36.0–46.0)
HCT: 35.6 % — ABNORMAL LOW (ref 36.0–46.0)
HCT: 36.4 % (ref 36.0–46.0)
HCT: 36.4 % (ref 36.0–46.0)
HCT: 37.3 % (ref 36.0–46.0)
HCT: 38 % (ref 36.0–46.0)
HCT: 45.1 % (ref 36.0–46.0)
Hemoglobin: 11.1 g/dL — ABNORMAL LOW (ref 12.0–15.0)
Hemoglobin: 11.9 g/dL — ABNORMAL LOW (ref 12.0–15.0)
Hemoglobin: 12.1 g/dL (ref 12.0–15.0)
Hemoglobin: 12.5 g/dL (ref 12.0–15.0)
Hemoglobin: 12.9 g/dL (ref 12.0–15.0)
Hemoglobin: 13 g/dL (ref 12.0–15.0)
Hemoglobin: 16 g/dL — ABNORMAL HIGH (ref 12.0–15.0)
MCH: 30.9 pg (ref 26.0–34.0)
MCH: 31.1 pg (ref 26.0–34.0)
MCH: 31.2 pg (ref 26.0–34.0)
MCH: 31.2 pg (ref 26.0–34.0)
MCH: 31.3 pg (ref 26.0–34.0)
MCH: 31.4 pg (ref 26.0–34.0)
MCH: 31.7 pg (ref 26.0–34.0)
MCHC: 32.7 g/dL (ref 30.0–36.0)
MCHC: 33.2 g/dL (ref 30.0–36.0)
MCHC: 33.3 g/dL (ref 30.0–36.0)
MCHC: 33.9 g/dL (ref 30.0–36.0)
MCHC: 34.9 g/dL (ref 30.0–36.0)
MCHC: 35.1 g/dL (ref 30.0–36.0)
MCHC: 35.5 g/dL (ref 30.0–36.0)
MCV: 88.8 fL (ref 78.0–100.0)
MCV: 89.3 fL (ref 78.0–100.0)
MCV: 89.7 fL (ref 78.0–100.0)
MCV: 91.8 fL (ref 78.0–100.0)
MCV: 93.6 fL (ref 78.0–100.0)
MCV: 94.3 fL (ref 78.0–100.0)
MCV: 94.5 fL (ref 78.0–100.0)
Platelets: 158 10*3/uL (ref 150–400)
Platelets: 169 10*3/uL (ref 150–400)
Platelets: 169 10*3/uL (ref 150–400)
Platelets: 170 10*3/uL (ref 150–400)
Platelets: 198 10*3/uL (ref 150–400)
Platelets: 198 10*3/uL (ref 150–400)
Platelets: 254 10*3/uL (ref 150–400)
RBC: 3.53 MIL/uL — ABNORMAL LOW (ref 3.87–5.11)
RBC: 3.85 MIL/uL — ABNORMAL LOW (ref 3.87–5.11)
RBC: 3.89 MIL/uL (ref 3.87–5.11)
RBC: 4.01 MIL/uL (ref 3.87–5.11)
RBC: 4.14 MIL/uL (ref 3.87–5.11)
RBC: 4.16 MIL/uL (ref 3.87–5.11)
RBC: 5.05 MIL/uL (ref 3.87–5.11)
RDW: 12.7 % (ref 11.5–15.5)
RDW: 12.7 % (ref 11.5–15.5)
RDW: 12.9 % (ref 11.5–15.5)
RDW: 12.9 % (ref 11.5–15.5)
RDW: 13.2 % (ref 11.5–15.5)
RDW: 13.6 % (ref 11.5–15.5)
RDW: 13.6 % (ref 11.5–15.5)
WBC: 12.7 10*3/uL — ABNORMAL HIGH (ref 4.0–10.5)
WBC: 18 10*3/uL — ABNORMAL HIGH (ref 4.0–10.5)
WBC: 18.9 10*3/uL — ABNORMAL HIGH (ref 4.0–10.5)
WBC: 20.9 10*3/uL — ABNORMAL HIGH (ref 4.0–10.5)
WBC: 23.1 10*3/uL — ABNORMAL HIGH (ref 4.0–10.5)
WBC: 24.6 10*3/uL — ABNORMAL HIGH (ref 4.0–10.5)
WBC: 7.4 10*3/uL (ref 4.0–10.5)

## 2010-04-13 LAB — URINE CULTURE
Colony Count: NO GROWTH
Culture  Setup Time: 201112301152
Culture: NO GROWTH

## 2010-04-13 LAB — URINALYSIS, ROUTINE W REFLEX MICROSCOPIC
Bilirubin Urine: NEGATIVE
Glucose, UA: 250 mg/dL — AB
Glucose, UA: >1000 mg/dL — AB
Hgb urine dipstick: NEGATIVE
Hgb urine dipstick: NEGATIVE
Ketones, ur: 15 mg/dL — AB
Ketones, ur: NEGATIVE mg/dL
Leukocytes, UA: NEGATIVE
Nitrite: NEGATIVE
Nitrite: NEGATIVE
Protein, ur: NEGATIVE mg/dL
Protein, ur: NEGATIVE mg/dL
Specific Gravity, Urine: 1.027 (ref 1.005–1.030)
Specific Gravity, Urine: 1.031 — ABNORMAL HIGH (ref 1.005–1.030)
Urobilinogen, UA: 0.2 mg/dL (ref 0.0–1.0)
Urobilinogen, UA: 0.2 mg/dL (ref 0.0–1.0)
pH: 5 (ref 5.0–8.0)
pH: 5.5 (ref 5.0–8.0)

## 2010-04-13 LAB — COMPREHENSIVE METABOLIC PANEL
ALT: 22 U/L (ref 0–35)
AST: 18 U/L (ref 0–37)
Albumin: 4 g/dL (ref 3.5–5.2)
Alkaline Phosphatase: 73 U/L (ref 39–117)
BUN: 15 mg/dL (ref 6–23)
CO2: 21 mEq/L (ref 19–32)
Calcium: 9.1 mg/dL (ref 8.4–10.5)
Chloride: 107 mEq/L (ref 96–112)
Creatinine, Ser: 0.53 mg/dL (ref 0.4–1.2)
GFR calc Af Amer: >60 mL/min (ref >60)
GFR calc non Af Amer: >60 mL/min (ref >60)
Glucose, Bld: 206 mg/dL — ABNORMAL HIGH (ref 70–99)
Potassium: 4 mEq/L (ref 3.5–5.1)
Sodium: 135 mEq/L (ref 135–145)
Total Bilirubin: 0.6 mg/dL (ref 0.3–1.2)
Total Protein: 6.9 g/dL (ref 6.0–8.3)

## 2010-04-13 LAB — BASIC METABOLIC PANEL
BUN: 11 mg/dL (ref 6–23)
BUN: 12 mg/dL (ref 6–23)
BUN: 22 mg/dL (ref 6–23)
CO2: 26 mEq/L (ref 19–32)
CO2: 27 mEq/L (ref 19–32)
CO2: 29 mEq/L (ref 19–32)
Calcium: 8.2 mg/dL — ABNORMAL LOW (ref 8.4–10.5)
Calcium: 8.2 mg/dL — ABNORMAL LOW (ref 8.4–10.5)
Calcium: 8.7 mg/dL (ref 8.4–10.5)
Chloride: 102 mEq/L (ref 96–112)
Chloride: 103 mEq/L (ref 96–112)
Chloride: 110 mEq/L (ref 96–112)
Creatinine, Ser: 0.63 mg/dL (ref 0.4–1.2)
Creatinine, Ser: 0.66 mg/dL (ref 0.4–1.2)
Creatinine, Ser: 0.96 mg/dL (ref 0.4–1.2)
GFR calc Af Amer: >60 mL/min (ref >60)
GFR calc Af Amer: >60 mL/min (ref >60)
GFR calc Af Amer: >60 mL/min (ref >60)
GFR calc non Af Amer: 59 mL/min — ABNORMAL LOW (ref >60)
GFR calc non Af Amer: >60 mL/min (ref >60)
GFR calc non Af Amer: >60 mL/min (ref >60)
Glucose, Bld: 124 mg/dL — ABNORMAL HIGH (ref 70–99)
Glucose, Bld: 141 mg/dL — ABNORMAL HIGH (ref 70–99)
Glucose, Bld: 179 mg/dL — ABNORMAL HIGH (ref 70–99)
Potassium: 4.1 mEq/L (ref 3.5–5.1)
Potassium: 4.4 mEq/L (ref 3.5–5.1)
Potassium: 4.4 mEq/L (ref 3.5–5.1)
Sodium: 135 mEq/L (ref 135–145)
Sodium: 139 mEq/L (ref 135–145)
Sodium: 143 mEq/L (ref 135–145)

## 2010-04-13 LAB — MAGNESIUM
Magnesium: 2.2 mg/dL (ref 1.5–2.5)
Magnesium: 2.6 mg/dL — ABNORMAL HIGH (ref 1.5–2.5)
Magnesium: 2.9 mg/dL — ABNORMAL HIGH (ref 1.5–2.5)
Magnesium: 3.4 mg/dL — ABNORMAL HIGH (ref 1.5–2.5)

## 2010-04-13 LAB — TYPE AND SCREEN
ABO/RH(D): AB POS
Antibody Screen: NEGATIVE

## 2010-04-13 LAB — POCT I-STAT, CHEM 8
BUN: 11 mg/dL (ref 6–23)
BUN: 19 mg/dL (ref 6–23)
Calcium, Ion: 1.16 mmol/L (ref 1.12–1.32)
Calcium, Ion: 1.18 mmol/L (ref 1.12–1.32)
Chloride: 105 mEq/L (ref 96–112)
Chloride: 109 mEq/L (ref 96–112)
Creatinine, Ser: 0.5 mg/dL (ref 0.4–1.2)
Creatinine, Ser: 1.1 mg/dL (ref 0.4–1.2)
Glucose, Bld: 168 mg/dL — ABNORMAL HIGH (ref 70–99)
Glucose, Bld: 171 mg/dL — ABNORMAL HIGH (ref 70–99)
HCT: 35 % — ABNORMAL LOW (ref 36.0–46.0)
HCT: 38 % (ref 36.0–46.0)
Hemoglobin: 11.9 g/dL — ABNORMAL LOW (ref 12.0–15.0)
Hemoglobin: 12.9 g/dL (ref 12.0–15.0)
Potassium: 4.3 mEq/L (ref 3.5–5.1)
Potassium: 4.5 mEq/L (ref 3.5–5.1)
Sodium: 139 mEq/L (ref 135–145)
Sodium: 143 mEq/L (ref 135–145)
TCO2: 25 mmol/L (ref 0–100)
TCO2: 28 mmol/L (ref 0–100)

## 2010-04-13 LAB — SURGICAL PCR SCREEN
MRSA, PCR: NEGATIVE
Staphylococcus aureus: NEGATIVE

## 2010-04-13 LAB — CREATININE, SERUM
Creatinine, Ser: 0.57 mg/dL (ref 0.4–1.2)
Creatinine, Ser: 0.96 mg/dL (ref 0.4–1.2)
GFR calc Af Amer: >60 mL/min (ref >60)
GFR calc Af Amer: >60 mL/min (ref >60)
GFR calc non Af Amer: 59 mL/min — ABNORMAL LOW (ref >60)
GFR calc non Af Amer: >60 mL/min (ref >60)

## 2010-04-13 LAB — PROTIME-INR
INR: 0.93 (ref 0.00–1.49)
INR: 1.26 (ref 0.00–1.49)
Prothrombin Time: 12.7 seconds (ref 11.6–15.2)
Prothrombin Time: 16 seconds — ABNORMAL HIGH (ref 11.6–15.2)

## 2010-04-13 LAB — HEMOGLOBIN AND HEMATOCRIT, BLOOD
HCT: 29.2 % — ABNORMAL LOW (ref 36.0–46.0)
Hemoglobin: 10.4 g/dL — ABNORMAL LOW (ref 12.0–15.0)

## 2010-04-13 LAB — PLATELET COUNT: Platelets: 175 10*3/uL (ref 150–400)

## 2010-04-13 LAB — URINE MICROSCOPIC-ADD ON

## 2010-04-13 LAB — AMYLASE: Amylase: 23 U/L (ref 0–105)

## 2010-04-13 LAB — POCT I-STAT GLUCOSE
Glucose, Bld: 151 mg/dL — ABNORMAL HIGH (ref 70–99)
Operator id: 286331

## 2010-04-13 LAB — APTT
aPTT: 24 seconds (ref 24–37)
aPTT: 32 seconds (ref 24–37)

## 2010-04-15 ENCOUNTER — Encounter (HOSPITAL_COMMUNITY): Payer: 59

## 2010-04-16 LAB — POCT I-STAT GLUCOSE
Glucose, Bld: 226 mg/dL — ABNORMAL HIGH (ref 70–99)
Operator id: 141321

## 2010-04-17 ENCOUNTER — Encounter (HOSPITAL_COMMUNITY): Payer: 59

## 2010-04-20 ENCOUNTER — Encounter (HOSPITAL_COMMUNITY): Payer: 59

## 2010-04-22 ENCOUNTER — Encounter (HOSPITAL_COMMUNITY): Payer: 59

## 2010-04-24 ENCOUNTER — Encounter (HOSPITAL_COMMUNITY): Payer: 59

## 2010-04-27 ENCOUNTER — Encounter (HOSPITAL_COMMUNITY): Payer: 59

## 2010-04-29 ENCOUNTER — Encounter (HOSPITAL_COMMUNITY): Payer: 59

## 2010-04-30 NOTE — Miscellaneous (Signed)
Summary: [facility] Last Lab Request   [facility] Last Lab Request   Imported By: Sallee Provencal 04/10/2010 11:11:01  _____________________________________________________________________  External Attachment:    Type:   Image     Comment:   External Document  Appended Document: [facility] Last Lab Request  will review with her at the time of her follow up office visits. TS

## 2010-05-01 ENCOUNTER — Encounter (HOSPITAL_COMMUNITY): Payer: 59

## 2010-05-04 ENCOUNTER — Encounter (HOSPITAL_COMMUNITY): Payer: 59

## 2010-05-06 ENCOUNTER — Encounter (HOSPITAL_COMMUNITY): Payer: 59

## 2010-05-08 ENCOUNTER — Encounter (HOSPITAL_COMMUNITY): Payer: 59

## 2010-05-11 ENCOUNTER — Encounter (HOSPITAL_COMMUNITY): Payer: 59

## 2010-05-13 ENCOUNTER — Encounter (HOSPITAL_COMMUNITY): Payer: 59

## 2010-05-15 ENCOUNTER — Encounter (HOSPITAL_COMMUNITY): Payer: 59

## 2010-05-18 ENCOUNTER — Encounter (HOSPITAL_COMMUNITY): Payer: 59 | Attending: Cardiology

## 2010-05-18 ENCOUNTER — Other Ambulatory Visit: Payer: Self-pay | Admitting: Cardiology

## 2010-05-18 DIAGNOSIS — Z9861 Coronary angioplasty status: Secondary | ICD-10-CM | POA: Insufficient documentation

## 2010-05-18 DIAGNOSIS — G4733 Obstructive sleep apnea (adult) (pediatric): Secondary | ICD-10-CM | POA: Insufficient documentation

## 2010-05-18 DIAGNOSIS — I10 Essential (primary) hypertension: Secondary | ICD-10-CM | POA: Insufficient documentation

## 2010-05-18 DIAGNOSIS — Z951 Presence of aortocoronary bypass graft: Secondary | ICD-10-CM | POA: Insufficient documentation

## 2010-05-18 DIAGNOSIS — Z7902 Long term (current) use of antithrombotics/antiplatelets: Secondary | ICD-10-CM | POA: Insufficient documentation

## 2010-05-18 DIAGNOSIS — Z880 Allergy status to penicillin: Secondary | ICD-10-CM | POA: Insufficient documentation

## 2010-05-18 DIAGNOSIS — I252 Old myocardial infarction: Secondary | ICD-10-CM | POA: Insufficient documentation

## 2010-05-18 DIAGNOSIS — I472 Ventricular tachycardia, unspecified: Secondary | ICD-10-CM | POA: Insufficient documentation

## 2010-05-18 DIAGNOSIS — Z794 Long term (current) use of insulin: Secondary | ICD-10-CM | POA: Insufficient documentation

## 2010-05-18 DIAGNOSIS — I2582 Chronic total occlusion of coronary artery: Secondary | ICD-10-CM | POA: Insufficient documentation

## 2010-05-18 DIAGNOSIS — Z7982 Long term (current) use of aspirin: Secondary | ICD-10-CM | POA: Insufficient documentation

## 2010-05-18 DIAGNOSIS — J45909 Unspecified asthma, uncomplicated: Secondary | ICD-10-CM | POA: Insufficient documentation

## 2010-05-18 DIAGNOSIS — Z5189 Encounter for other specified aftercare: Secondary | ICD-10-CM | POA: Insufficient documentation

## 2010-05-18 DIAGNOSIS — Z79899 Other long term (current) drug therapy: Secondary | ICD-10-CM | POA: Insufficient documentation

## 2010-05-18 DIAGNOSIS — K219 Gastro-esophageal reflux disease without esophagitis: Secondary | ICD-10-CM | POA: Insufficient documentation

## 2010-05-18 DIAGNOSIS — I4729 Other ventricular tachycardia: Secondary | ICD-10-CM | POA: Insufficient documentation

## 2010-05-18 DIAGNOSIS — E785 Hyperlipidemia, unspecified: Secondary | ICD-10-CM | POA: Insufficient documentation

## 2010-05-18 DIAGNOSIS — E119 Type 2 diabetes mellitus without complications: Secondary | ICD-10-CM | POA: Insufficient documentation

## 2010-05-18 DIAGNOSIS — I2510 Atherosclerotic heart disease of native coronary artery without angina pectoris: Secondary | ICD-10-CM | POA: Insufficient documentation

## 2010-05-20 ENCOUNTER — Other Ambulatory Visit: Payer: Self-pay | Admitting: Cardiology

## 2010-05-20 ENCOUNTER — Encounter (HOSPITAL_COMMUNITY): Payer: 59

## 2010-05-20 LAB — GLUCOSE, CAPILLARY
Glucose-Capillary: 144 mg/dL — ABNORMAL HIGH (ref 70–99)
Glucose-Capillary: 103 mg/dL — ABNORMAL HIGH (ref 70–99)

## 2010-05-22 ENCOUNTER — Other Ambulatory Visit: Payer: Self-pay | Admitting: Cardiology

## 2010-05-22 ENCOUNTER — Encounter (HOSPITAL_COMMUNITY): Payer: 59

## 2010-05-22 LAB — GLUCOSE, CAPILLARY: Glucose-Capillary: 158 mg/dL — ABNORMAL HIGH (ref 70–99)

## 2010-05-25 ENCOUNTER — Encounter (HOSPITAL_COMMUNITY): Payer: 59

## 2010-05-25 ENCOUNTER — Other Ambulatory Visit: Payer: Self-pay | Admitting: Cardiology

## 2010-05-25 LAB — GLUCOSE, CAPILLARY
Glucose-Capillary: 166 mg/dL — ABNORMAL HIGH (ref 70–99)
Glucose-Capillary: 119 mg/dL — ABNORMAL HIGH (ref 70–99)

## 2010-05-27 ENCOUNTER — Other Ambulatory Visit: Payer: Self-pay | Admitting: Cardiology

## 2010-05-27 ENCOUNTER — Encounter (HOSPITAL_COMMUNITY): Payer: 59

## 2010-05-27 LAB — GLUCOSE, CAPILLARY: Glucose-Capillary: 102 mg/dL — ABNORMAL HIGH (ref 70–99)

## 2010-05-29 ENCOUNTER — Encounter (HOSPITAL_COMMUNITY): Payer: 59

## 2010-06-01 ENCOUNTER — Encounter (HOSPITAL_COMMUNITY): Payer: 59

## 2010-06-01 ENCOUNTER — Other Ambulatory Visit: Payer: Self-pay | Admitting: Cardiology

## 2010-06-01 LAB — GLUCOSE, CAPILLARY: Glucose-Capillary: 133 mg/dL — ABNORMAL HIGH (ref 70–99)

## 2010-06-02 ENCOUNTER — Encounter: Payer: Self-pay | Admitting: Cardiology

## 2010-06-03 ENCOUNTER — Encounter (HOSPITAL_COMMUNITY): Payer: 59 | Attending: Cardiology

## 2010-06-03 ENCOUNTER — Encounter: Payer: Self-pay | Admitting: Cardiology

## 2010-06-03 ENCOUNTER — Ambulatory Visit (INDEPENDENT_AMBULATORY_CARE_PROVIDER_SITE_OTHER): Payer: 59 | Admitting: Cardiology

## 2010-06-03 VITALS — BP 146/76 | HR 59 | Resp 20 | Ht 63.0 in | Wt 230.4 lb

## 2010-06-03 DIAGNOSIS — E119 Type 2 diabetes mellitus without complications: Secondary | ICD-10-CM | POA: Insufficient documentation

## 2010-06-03 DIAGNOSIS — Z951 Presence of aortocoronary bypass graft: Secondary | ICD-10-CM | POA: Insufficient documentation

## 2010-06-03 DIAGNOSIS — I4729 Other ventricular tachycardia: Secondary | ICD-10-CM | POA: Insufficient documentation

## 2010-06-03 DIAGNOSIS — I252 Old myocardial infarction: Secondary | ICD-10-CM | POA: Insufficient documentation

## 2010-06-03 DIAGNOSIS — I2510 Atherosclerotic heart disease of native coronary artery without angina pectoris: Secondary | ICD-10-CM | POA: Insufficient documentation

## 2010-06-03 DIAGNOSIS — J45909 Unspecified asthma, uncomplicated: Secondary | ICD-10-CM | POA: Insufficient documentation

## 2010-06-03 DIAGNOSIS — I2582 Chronic total occlusion of coronary artery: Secondary | ICD-10-CM | POA: Insufficient documentation

## 2010-06-03 DIAGNOSIS — I472 Ventricular tachycardia, unspecified: Secondary | ICD-10-CM | POA: Insufficient documentation

## 2010-06-03 DIAGNOSIS — I10 Essential (primary) hypertension: Secondary | ICD-10-CM | POA: Insufficient documentation

## 2010-06-03 DIAGNOSIS — Z7982 Long term (current) use of aspirin: Secondary | ICD-10-CM | POA: Insufficient documentation

## 2010-06-03 DIAGNOSIS — E785 Hyperlipidemia, unspecified: Secondary | ICD-10-CM | POA: Insufficient documentation

## 2010-06-03 DIAGNOSIS — Z5189 Encounter for other specified aftercare: Secondary | ICD-10-CM | POA: Insufficient documentation

## 2010-06-03 DIAGNOSIS — Z79899 Other long term (current) drug therapy: Secondary | ICD-10-CM | POA: Insufficient documentation

## 2010-06-03 DIAGNOSIS — K219 Gastro-esophageal reflux disease without esophagitis: Secondary | ICD-10-CM | POA: Insufficient documentation

## 2010-06-03 DIAGNOSIS — Z7902 Long term (current) use of antithrombotics/antiplatelets: Secondary | ICD-10-CM | POA: Insufficient documentation

## 2010-06-03 DIAGNOSIS — G4733 Obstructive sleep apnea (adult) (pediatric): Secondary | ICD-10-CM | POA: Insufficient documentation

## 2010-06-03 DIAGNOSIS — Z9861 Coronary angioplasty status: Secondary | ICD-10-CM | POA: Insufficient documentation

## 2010-06-03 DIAGNOSIS — Z794 Long term (current) use of insulin: Secondary | ICD-10-CM | POA: Insufficient documentation

## 2010-06-03 DIAGNOSIS — Z880 Allergy status to penicillin: Secondary | ICD-10-CM | POA: Insufficient documentation

## 2010-06-03 NOTE — Patient Instructions (Signed)
Your physician recommends that you schedule a follow-up appointment in: 6 months with Dr. [name]  Your physician has recommended you make the following change in your medication: STOP PLAVIX. REDUCE ASPIRIN to 81 mg daily in 2 weeks.

## 2010-06-05 ENCOUNTER — Encounter (HOSPITAL_COMMUNITY): Payer: 59

## 2010-06-08 ENCOUNTER — Encounter (HOSPITAL_COMMUNITY): Payer: 59

## 2010-06-10 ENCOUNTER — Encounter (HOSPITAL_COMMUNITY): Payer: 59

## 2010-06-11 NOTE — Assessment & Plan Note (Signed)
Seems to be doing pretty well from this standpoint.  Some shortness of breath, but not that active.

## 2010-06-11 NOTE — Assessment & Plan Note (Signed)
Managed carefully by Dr. Sandi Mariscal.

## 2010-06-11 NOTE — Progress Notes (Signed)
HPI:  Overall since her bypass surgery she has been doing well.  She denies chest pain, and her BP has been well controlled.  She had a URI, and is a little short of breath, but not too bad in general.  She has mild asthma.  Surgery done and she did well.    Sugars are a bit all over the map, however.    Current Outpatient Prescriptions  Medication Sig Dispense Refill  . albuterol (PROVENTIL) (2.5 MG/3ML) 0.083% nebulizer solution Take 2.5 mg by nebulization as needed.        Marland Kitchen albuterol (PROVENTIL,VENTOLIN) 90 MCG/ACT inhaler Inhale 2 puffs into the lungs every 6 (six) hours as needed.        . ALPRAZolam (XANAX) 0.5 MG tablet Take 0.5 mg by mouth 3 (three) times daily.       Marland Kitchen aspirin 325 MG EC tablet Take 325 mg by mouth daily.        . B Complex-C (B-COMPLEX WITH VITAMIN C) tablet Take 1 tablet by mouth daily.        . Calcium 1500 MG tablet Take 1,500 mg by mouth.        . Cholecalciferol (VITAMIN D3) 1000 UNITS CAPS Take 1 capsule by mouth daily.        . clopidogrel (PLAVIX) 75 MG tablet Take 75 mg by mouth daily.        . Ferrous Sulfate (IRON) 325 (65 FE) MG TABS Take 1 tablet by mouth daily.        . fluticasone (FLOVENT HFA) 110 MCG/ACT inhaler Inhale 2 puffs into the lungs as needed.        . Fluticasone-Salmeterol (ADVAIR DISKUS) 250-50 MCG/DOSE AEPB Inhale 1 puff into the lungs every 12 (twelve) hours.        . furosemide (LASIX) 40 MG tablet Take 40 mg by mouth daily.        Marland Kitchen gabapentin (NEURONTIN) 600 MG tablet Take 300 mg by mouth daily.        . hydrocodone-acetaminophen (LORCET-HD) 5-500 MG per capsule Take 1 capsule by mouth every 6 (six) hours as needed.        . insulin glargine (LANTUS) 100 UNIT/ML injection Inject 50 Units into the skin at bedtime.        Marland Kitchen losartan (COZAAR) 100 MG tablet Take 100 mg by mouth daily.        . meloxicam (MOBIC) 7.5 MG tablet Take 7.5 mg by mouth 2 (two) times daily.       . metoprolol (LOPRESSOR) 100 MG tablet Take 100 mg by mouth. Take  1/2 tablet twice daily       . Multiple Vitamin (MULTIVITAMIN) tablet Take 1 tablet by mouth daily.        . ondansetron (ZOFRAN) 4 MG tablet Take 4 mg by mouth every 8 (eight) hours as needed.        . ranitidine (ZANTAC) 150 MG tablet Take 150 mg by mouth 2 (two) times daily.        . rosuvastatin (CRESTOR) 40 MG tablet Take 40 mg by mouth daily.        Marland Kitchen zolpidem (AMBIEN) 10 MG tablet Take 10 mg by mouth at bedtime as needed.        Marland Kitchen APAP-Isometheptene-Dichloral 325-65-100 MG per capsule Take 1 capsule by mouth 4 (four) times daily as needed.          Allergies  Allergen Reactions  . Amoxicillin  REACTION: unspecified  . Erythromycin   . Metformin     REACTION: Dizzy and nause  . Penicillins     Past Medical History  Diagnosis Date  . Migraine   . Osteoarthritis   . Vomiting     persistent  . Obstructive sleep apnea   . Asthma   . Ventral hernia     hx of  . Diabetes mellitus   . GERD (gastroesophageal reflux disease)   . Obesity 01-2010  . Hyperlipidemia   . Hypertension   . Coronary artery disease   . H/O: hysterectomy     Past Surgical History  Procedure Date  . Coronary artery bypass graft   . Tumor removal     ovarian  . Leg surgery     right  . Hernia repair   . Angioplasty     multiple times  . Cholecysterc   . Cholecystectomy   . Gastric stapling 1970    Family History  Problem Relation Age of Onset  . Heart disease Father   . Diabetes      family hx of paternal grandmother, aunt and grandfather- uncles    History   Social History  . Marital Status: Married    Spouse Name: N/A    Number of Children: N/A  . Years of Education: N/A   Occupational History  . Not on file.   Social History Main Topics  . Smoking status: Never Smoker   . Smokeless tobacco: Not on file  . Alcohol Use: No  . Drug Use: No  . Sexually Active: Not on file   Other Topics Concern  . Not on file   Social History Narrative  . No narrative on file     ROS: Please see the HPI.  All other systems reviewed and negative.  PHYSICAL EXAM:  BP 146/76  Pulse 59  Resp 20  Ht 5\' 3"  (1.6 m)  Wt 230 lb 6.4 oz (104.509 kg)  BMI 40.81 kg/m2  General: Well developed, well nourished, in no acute distress. Head:  Normocephalic and atraumatic. Neck: no JVD Lungs: Clear to auscultation and percussion. Heart: Normal S1 and S2.  No murmur, rubs or gallops.  Abdomen:  Normal bowel sounds; soft; non tender; no organomegaly Pulses: Pulses normal in all 4 extremities. Extremities: No clubbing or cyanosis. No edema. Neurologic: Alert and oriented x 3.  EKG:  NSR.  LVH with repolarization abnormality.  No acute changes.   ASSESSMENT AND PLAN:

## 2010-06-11 NOTE — Assessment & Plan Note (Signed)
Best results I have ever seen in this patient.  Continue medical treatment.  Follow up with Dr. Sandi Mariscal, and Dr. Lorrene Reid.  TS

## 2010-06-12 ENCOUNTER — Encounter (HOSPITAL_COMMUNITY): Payer: 59

## 2010-06-15 ENCOUNTER — Encounter (HOSPITAL_COMMUNITY): Payer: 59

## 2010-06-16 NOTE — Assessment & Plan Note (Signed)
[place]                            CARDIOLOGY OFFICE NOTE   PARILEE, NYHUS                       [name]:          [id number]  DATE:01/09/2007                            DOB:          October 22, 1945    [name] [name] comes in for follow up.  She was recently hospitalized and  underwent catheterization again.  She has continued to have some chest  discomfort.  Her blood pressures do remain elevated.  She gets short of  breath when she exerts herself.  Repeat cardiac catheterization  demonstrated continued patency of the previously-placed stent.  There  was about 50% LAD stenosis with total occlusion of the right coronary  artery.  She gets fairly markedly hypertensive, as demonstrated in the  catheterization laboratory.  When she got up from her bed rest, her  pressure went to as high as 230/120.  She does have sleep apnea and has  not been able to wear her mask because of fitting difficulties, and I  therefore asked her to see Dr. Danton [name] back in follow up.   PHYSICAL EXAMINATION:  VITAL SIGNS:  Today, her blood pressure is  178/90, pulse is 80.  LUNGS:  Lung fields are clear.  CARDIAC:  Rhythm is regular.  GROIN:  Well healed.   We will go ahead and get her an appointment to see Dr. Gwenette [name], and then  we will see her shortly thereafter.  She remains fairly symptomatic, and  her hypertension is poorly controlled, despite a multitude of  medications, followed by Dr. Lorrene [name].  We will see if he has any  suggestions with regard to better management of her sleep apnea.  I will  see her shortly thereafter to decide on any further testing.     [name] [name]. [name], MD, [facility]  Electronically Signed    TDS/MedQ  DD: 01/09/2007  DT: 01/10/2007  Job #: [id number]   cc:   Derinda Late, M.D.

## 2010-06-16 NOTE — Assessment & Plan Note (Signed)
[place]                            CARDIOLOGY OFFICE NOTE   [name], [name]                       [name]:          [id number]  DATE:01/24/2008                            DOB:          [birth year]    [name] [name] is in for followup.  She in general is stable.  Her blood  pressure does remain high, but she is being followed by Dr. Krystal Eaton  closely.  They are using sublingual or nitroglycerin patch to try to  keep her blood pressure down specifically, and this seems to work,  although she does not like to wear when she goes out.  Her blood  pressure does remain high.  There remains a question of absorption.  She  tried a clonidine patch, but this did not work too well.  She broke out,  and despite efforts to try to avoid this, it did not work.   MEDICATIONS:  1. Cozaar 100 mg daily.  2. Metolazone 5 mg daily.  3. Minoxidil 10 mg t.i.d.  4. Furosemide 80 mg b.i.d.  5. Lantus.  6. Aspirin 81 mg daily.  7. Omeprazole 20 mg b.i.d.  8. Plavix 75 mg daily.  9. Multivitamin 1 daily.  10.Vitamin B12 1000 mg daily.  11.Nitro-Bid ointment.   PHYSICAL EXAMINATION:  GENERAL:  Alert and oriented.  VITAL SIGNS:  Blood pressure is 200/110, pulse is 66.  LUNGS:  Fields clear.  CARDIAC:  Rhythm is regular.  EXTREMITIES:  No edema.   EKG reveals normal sinus rhythm with left ventricular hypertrophy.   IMPRESSION:  1. Coronary artery disease with prior acute myocardial infarction      intervention and subsequent elective intervention with stenting of      the circumflex coronary artery.  2. Severe hypertension, difficult to control on current medical      regimen.  3. History of prior gastrointestinal surgery.  4. Diabetes mellitus.  5. Obesity.   PLAN AND RECOMMENDATIONS:  1. Continue medical regimen.  2. I will advise her about the omeprazole and this interaction with      Plavix.  She will return to see me in followup in 6 months.     [name] [name]. [name], MD, [facility]  Electronically Signed    TDS/MedQ  DD: 01/24/2008  DT: 01/25/2008  Job #: [id number]

## 2010-06-16 NOTE — Assessment & Plan Note (Signed)
[place]                            CARDIOLOGY OFFICE NOTE   [name], [name]                       [name]:          [id number]  DATE:05/23/2007                            DOB:          07/22/1945    Mr. [name] [name] is in for follow up.  She is scheduled see Dr. Lorrene Reid on  Friday.  She saw Dr._________at [facility] last week.  Some additional  laboratory studies have been obtained.  Apparently, Dr.  __________  recommended potentially that she be admitted to the hospital, all of her  medicines stopped and that she be restarted on medicines frequently.  The exact reason why she is difficult to control is not clear.  She has  had a little bit of chest discomfort, but has had chest discomfort  chronically so; perhaps might be a little bit different, but she takes  no nitroglycerin and it goes away easily.  She was last cathed in  November.  At that time, her stent was widely patent.  Her right is  known to be occluded.  Her renal arteries appeared to be patent, as  well.  The remainder of her disease is scattered throughout with  moderate disease of the LAD, but there has not been a dramatic change in  her overall status.  Her blood pressure has been poorly controlled  despite multiple medications as listed in her chart.  She is to see Dr.  Lorrene Reid on Friday and final disposition made about this.   PHYSICAL EXAMINATION:  VITAL SIGNS:  Today on exam, the blood pressure  is 190/100, pulse is 70 and regular.  LUNGS:  The lung fields are clear.  HEART:  The cardiac rhythm is regular with an S4 gallop.   The situation with [name] [name] continues.  She has uncontrolled  hypertension despite multiple medications.  She had been followed by  Nephrology and Hypertension here in [place], and also had been seen  in consultation at the [facility] hypertension unit.  Control of this has been  difficult.  Formal recommendations are being made through Dr. Lorrene Reid,  and we will follow  her closely in the hospital with this.  Given that, I  will not make any dramatic changes in her overall medications.  With  regard to the sleep apnea, apparently Dr.  ________  told her she would  hold off on that right now.  This would require surgery, and she would  have to be off antiplatelet agents to make this acceptable.  She is a  drug-eluting stent in her circumflex.     [name] [name]. [name] Foyer, MD, [facility]  Electronically Signed    TDS/MedQ  DD: 05/23/2007  DT: 05/23/2007  Job #: [id number]

## 2010-06-16 NOTE — Letter (Signed)
September 29, 2006    Caren Griffins B. Lorrene Reid, M.D.  [address]  [place], [place] [address]   Derinda Late, M.D.  [address] [place], [place] [address]   RE:  [name], [name]  [name]:  [id number]  /  DOB:  1945/05/09   Dear Laurey Arrow and Jenny Reichmann:   I had the pleasure of seeing [name] [name] in the office today in a  follow-up visit.  She continues to have on occasion some mild chest  discomfort, although she is actually working with an Product/process development scientist  now.  Overall she thinks this has been a good thing, but her blood  pressure was up somewhat.  She saw Dr. Lorrene Reid and her minoxidil was  increased.  The issue came up today as to whether her beta blocker could  be increased, but most of her heart rates are between 50 and 60.  While  that could be accomplished, I would be perhaps a little reluctant to  consider that.   Today on examination the blood pressure was 172/98 and the pulse  actually was 87.  Her lung fields were clear.  The cardiac rhythm was regular.   I think we could potentially increase her beta blockade, but I might  also be inclined to increase her Norvasc.  The pulses that she has  brought in have generally been in the 50s and 60s, although it is  somewhat higher today.  In either case, she probably does need better  blood pressure control and she possibly also does need to cut down on  the amount of lifting she is doing.  She was doing up to 20 pounds in  each arm and I have asked her to cut that down to 10 pounds in each arm.  If she has any increase in her symptoms, she is to call us.  She will  continue to record her pressures and report those to Dr. Lorrene Reid.   I appreciate the opportunity of sharing in her care.   As an addendum, she will see how she does with the increased dose of  minoxidil before making any changes.    Sincerely,      [name] [name]. [name], MD, [facility]  Electronically Signed    TDS/MedQ  DD: 09/29/2006  DT: 09/30/2006  Job #: [id number]

## 2010-06-16 NOTE — Assessment & Plan Note (Signed)
[place]                            CARDIOLOGY OFFICE NOTE   LIEN, KAIGLER                       [name]:          [id number]  DATE:12/14/2006                            DOB:          Mar 29, 1945    PRIMARY CARDIOLOGIST:  [name]. [name], MD, [facility].   NEPHROLOGIST:  Elzie Rings. Lorrene Reid, M.D.   GASTROENTEROLOGIST:  Lowella Bandy. Olevia Perches, M.D.   PRIMARY CARE PHYSICIAN:  Derinda Late, M.D.   PATIENT PROFILE:  A 65 year old Caucasian female with a prior history of  CAD, with multiple interventions who presents to the clinic today with  progressive angina as well as dyspnea on exertion.   PROBLEM LIST:  1. Coronary artery disease.      a.     April 06, 1999, acute inferior wall MI, status post PCI and       stenting of the proximal and mid RCA with bare metal stent and       PTCA of the proximal PDA.      b.     Jun 03, 1999, in-stent restenosis within the RCA with PTCA       and beta radiation.      c.     January 06, 2000, in-stent restenosis in the RCA, status       post PTCA with cutting-balloon angioplasty along with stenting of       the distal RCA.      d.     February 22, 2005, catheterization revealing a proximally       occluded right coronary artery.      e.     January 26, 2005, catheterization revealing high grade       disease in the diagonal, circumflex marginal, and total occlusion       of the right coronary artery with an EF of greater than 50%.       Disease was not felt to be suitable for grafting and decision was       made to pursue percutaneous intervention of the circumflex       marginal and this was successfully performed with placement of a       2.75 x 13-mm CYPHER drug-eluting stent on January 28, 2005.  2. Hypertension, poorly controlled despite multiple medications.  3. Hyperlipidemia.  4. Morbid obesity.  5. GERD.  6. Insulin-dependent-diabetes mellitus.  7. History of ventral hernia, status post repair.  8.  Asthma.  9. Osteoarthritis.   HISTORY OF PRESENT ILLNESS:  A 65 year old Caucasian female with the  above problem list.  She last saw Dr. [name] approximately 3 weeks ago.  Unfortunately, in that time span she has continued to have progressively  worsening dyspnea on exertion and more frequent 5-8 out of 10 exertional  substernal chest pressure associated with shortness of breath and  occasional nausea, lasting anywhere between 5-60 minutes.  She will  generally try to relieve her symptoms via rest which is usually  successful; however, at times she does require between 1-2 sublingual  nitroglycerin.  When she does take nitroglycerin, symptoms resolve  fairly quickly.  Her blood pressure remains elevated and she was just  initiated on p.r.n. clonidine yesterday.  She denies any PND, orthopnea,  dizziness, syncope, edema, or early satiety.   ALLERGIES:  1. PENICILLIN.  2. ERYTHROMYCIN.   HOME MEDICATIONS:  1. Cozaar 100 mg b.i.d.  2. Crestor 20 mg daily.  3. Furosemide 80 mg, one b.i.d.  4. Lantus as directed.  5. Norvasc 5 mg daily.  6. Aspirin 81 mg daily.  7. Lisinopril 20 mg b.i.d.  8. Omeprazole 20 mg b.i.d.  9. Plavix 75 mg daily.  10.Multivitamin one daily.  11.B12 1,000 mg daily.  12.B complex daily.  13.Calcium 1500 mg daily.  14.Reglan 10 mg q.i.d. a.c. and h.s.  15.Minoxidil 10 mg, one and a half tablets q.a.m.; one and a half      tablets q.p.m.  16.Miacalcin 200 units inhaled daily.  17.Prevalite 4 grams two to three scoops b.i.d.  18.Vitamin D4, 50,000 units every week.  19.Lopressor 50 mg b.i.d.  20.Nitroglycerin p.r.n.  21.Clonidine 0.2 mg q.6 h. p.r.n.   FAMILY HISTORY:  Father died at age 41 of an MI.  Mother is alive at age  25 with hypertension.  She has a sister who is age 36 and alive and  well.   SOCIAL HISTORY:  She lives in [place] with her husband.  She denies  any tobacco or alcohol use.  She is trying to exercise 3-4 times per  week  with a trainer, however, is limited by her angina.   REVIEW OF SYSTEMS:  She had chills and low grade fever and congestion  earlier this week but this has resolved.  She has chronic nausea and  occasional vomiting.  She is diabetic.  She has chest pain and dyspnea  on exertion as outlined in the HPI.  Otherwise, all systems reviewed and  negative.   PHYSICAL EXAMINATION:  VITAL SIGNS:  Blood pressure 197/101 with a heart  rate of 95, respirations 16, weight is 255 pounds up 2 pounds since her  last visit.  GENERAL:  A pleasant white female in no acute distress, awake, alert,  and oriented x3.  NECK:  Obese without any bruits, JVD.  LUNGS:  Respirations regular unlabored.  Clear to auscultation.  CARDIAC:  Regular regular S1 S2.  No S3, S4, murmurs.  ABDOMEN:  Obese, soft, nontender, nondistended.  Bowel sounds present  x4.  EXTREMITIES:  Warm, dry, and pink.  No clubbing, cyanosis, or edema.  Dorsalis pedis and posterior tibialis pulses 2 plus and equal  bilaterally.  No femoral bruits are noted.   ACCESSORY CLINICAL FINDINGS:  CBC, B-MET, PT, PTT, and a chest x-ray are  pending.  EKG is pending.   ASSESSMENT/PLAN:  1. Coronary artery disease.  The patient presents with recurrent and      progressive exertional angina as well as dyspnea similar to her      previous symptoms.  She has been seen by Dr. [name] and the plan      is for left heart cardiac catheterization on Wednesday, November      19th, to re-evaluate her coronary anatomy.  She remains on aspirin,      Plavix, beta-blocker, ACE inhibitor, Statin, and calcium channel      blocker therapy.  2. Hypertension.  This remains very difficult to control and she is      most closely followed by Dr. Lorrene Reid and was initiated on as needed      clonidine yesterday.  We  made no changes to her medications today.  3. Hyperlipidemia.  She remains on Crestor therapy and is tolerating      this well.  4. Type 2 diabetes mellitus,  followed by Dr. Sandi Mariscal.  She remains on      Lantus therapy.  5. Morbid obesity.  The patient is making an honest effort to loose      weight through and exercise regimen 3-4 times per week with a      trainer; however, her dyspnea and angina have been limiting her      over the past month or so.  Hopefully, we can get her back to a      point where she can exercise regularly again.   DISPOSITION:  We will obtain lab work and chest x-ray and plan for  cardiac catheterization on November 19th.      Murray Hodgkins, ANP  Electronically Signed      [name]. [name], MD, [facility]  Electronically Signed   CB/MedQ  DD: 12/14/2006  DT: 12/15/2006  Job #: [id number]

## 2010-06-16 NOTE — Letter (Signed)
October 30, 2007    Derinda Late, M.D.  [address] [place], [place] [address]   RE:  [name], [name]  [name]:  [id number]  /  DOB:  1945/09/06   Dear [name],   I had the pleasure of seeing [name] [name] in the office today in  followup.  She continues to have occasional chest discomfort, it has not  been necessarily progressive, but it would seem that it is largely  related to her hypertension.  As this is poorly controlled at times, it  seems to be worse.  As you know, she underwent repeat cardiac  catheterization, and this was done in November 2008.  At that time, she  was noted to have chronic total occlusion of the right coronary artery  with diffusely diseased distal vessel, this was filled by collaterals,  it was not a good vessel for bypass.  She has an inferobasal wall motion  abnormality.  The circumflex stent with a drug-eluting platform had  about 50-60% narrowing proximally the stent, but there was no evidence  of significant restenosis, and the LAD did not have high-grade disease.  As a function of this, we have continued to recommend medical therapy,  and Krystal Eaton has done an excellent job of working with her with  regard to management of her hypertension with a recent episode.  I know  you obtained carotid Dopplers and these demonstrate 0-39% bilateral ICA  stenosis without critical disease.  A copy of this report will be sent  to your office.  She denies any ongoing chest pain at least today and on  a regular basis.   MEDICATIONS:  Her medications are complex as you know.  They include;  1. Furosemide 80 mg b.i.d.  2. Lantus as directed.  3. Enteric-coated aspirin 81 mg daily.  4. Omeprazole 20 mg b.i.d.  5. Plavix 75 mg daily.  6. Multivitamin daily.  7. B12 daily.  8. B complex daily.  9. Calcium daily.  10.Lopressor 50 mg b.i.d.  11.Clonidine 0.3 mg p.o. t.i.d.  12.Lisinopril 20 mg b.i.d.  13.Lipitor 40 mg daily.  14.Vitamin D.  15.Cozaar 100 mg  daily.  16.Metolazone 5 mg daily.  17.Minoxidil 10 mg t.i.d.   She also of note, has had some nausea and vomiting and has been seen by  Delfin Edis, and an upper endoscopy is scheduled   Today on physical, initially blood pressure was 200/100, but within a  few minutes it was 160/90-100, pulse was 64.  The lung fields were clear  and the cardiac rhythm is regular.   The electrocardiogram was unremarkable.   In summary, [name] [name] is a very delightful, but difficult patient due to  her severe hypertension.  Management of this has been an ongoing  problem, and she has had wonderful expertise involved in the form of Dr.  Lorrene Reid and also at [facility].  We did not make any changes in her current  medical regimen.  From a cardiac standpoint, she should remain on the  same current treatment strategy.  We will continue to see her on an  every 63-month basis.  I appreciate the opportunity of sharing in her  care.    Sincerely,      [name]. [name], MD, [facility]  Electronically Signed    TDS/MedQ  DD: 10/30/2007  DT: 10/31/2007  Job #: [id number]

## 2010-06-16 NOTE — Cardiovascular Report (Signed)
[name] [name], [name] [name]                ACCOUNT NO.:  [id number]   MEDICAL RECORD NO.:  [id number]          PATIENT TYPE:  OIB   LOCATION:  2899                         FACILITY:  [facility]   PHYSICIAN:  [name]. [name], MD, FACCDATE OF BIRTH:  05-21-1945   DATE OF PROCEDURE:  12/21/2006  DATE OF DISCHARGE:                            CARDIAC CATHETERIZATION   INDICATIONS:  Ms. Goldfield is well-known to me.  She has diabetes and  obesity.  She also has sleep apnea. She has had very difficult to  control hypertension despite multiple medications.  With this, she has  increasingly had more angina.  She presented previously with an inferior  infarction and was treated with stenting.  She ultimately developed  restenosis, and then eventually total occlusion of the right coronary.  In 2006, she developed unstable angina and had a ruptured plaque in her  mid circumflex which was successfully stented using a drug-eluting  platform.  Since that time, she has done well but more recently  developed increasing angina in combination with difficult to control  hypertension despite multiple drugs under expert care by Dr. Lorrene Reid.  With this, we have been following her closely, and eventually decided to  go ahead and recommend a repeat angiography.  The risks, benefits and  alternatives were discussed with the patient in advance, and she is  consented to proceed.   PROCEDURE:  1. Left heart catheterization.  2. Selective coronary arteriography.  3. Selective left ventriculography.  4. Distal aortography without runoff.   DESCRIPTION OF PROCEDURE:  The patient was brought to the cath lab and  prepped and draped in the usual fashion. Through an anterior puncture  using a Smart needle, the right femoral artery was entered.  A 5-French  sheath was then placed.  Following this, central aortic and left  ventricular pressures were measured with a pigtail catheter.  Ventriculography was performed in the RAO  projection.  Following a  pressure pullback, we did an abdominal study without runoff.  This was  to highlight the renal arteries.  Following this, the pigtail catheter  was removed. With the blood pressure elevated hydralazine was  administered as well as intravenous metoprolol.  Views of the right  coronary artery were then obtained followed by multiple views of the  left coronary artery.  Following this, we kept the patient in the lab  for a while to make sure that the blood pressure came down  appropriately.  I also reviewed the films in detail with the patient's  spouse and daughter with the patient's consent.  There were no  complications and she was taken to the holding area for convalescent  care.   HEMODYNAMIC DATA:  1. Central aortic pressure prior to drug therapy 200/92 with a mean of      135.  2. Left ventricular pressure 206/20.  3. No gradient on pullback across the aortic valve.   ANGIOGRAPHIC DATA:  1. On plain fluoroscopy there was evidence of previously placed      stents.  There was a long stent in the mid right coronary and  distal right coronary.  There was a stent that was obviously in the      mid portion of the circumflex.  There was also moderate      calcification particularly of the left anterior descending artery.  2. The right coronary artery is totally occluded just proximal to the      stent. This mirrors previous angiographic studies as seen in 2006.      The distal vessel is diffusely diseased, small in caliber, supplies      an infarcted area, and is filled retrograde during the left      coronary injections.  3. The left main coronary artery is free of critical disease.  4. The left anterior descending artery courses to the apex.  There is      diffuse luminal irregularities with typical pruning seen in      diabetes.  There is a small first diagonal. There is an eccentric      plaque of about 50% in the mid LAD in the area of calcified       disease. The second diagonal is also a typical diabetic-appearing      artery with at least 70% ostial narrowing, but relatively small.      The mid and distal left anterior descending artery has diffuse      luminal irregularities compatible with diabetic LAD disease.  5. The circumflex provides a tiny first marginal that has about 80%      ostial narrowing and is not suitable for intervention.  The AV      circumflex is a fairly large-caliber vessel that takes a steep bend      in the mid portion, it then divides into an AV circumflex branch      and a large marginal branch. There is a bend at this point with      flexion with about 50-60% narrowing. Just distal to this is a      widely patent drug-eluting platform stent without evidence of      significant restenosis.  The vessel distally bifurcates, and the      superior __________  has obvious disease at the ostium which is      greater than 40-50%, but in the setting of known diabetes likely      represents more severe  changes. The distal vessel and inferior      branch also have a fair amount of luminal irregularity in pruning      as is typical of diabetes.  6. The ventriculogram demonstrates mild reduction in left ventricular      function with an ejection fraction estimated at about 45%.  There      is a large area of inferobasal akinesis.  This is compatible with      her original infarct presentation.  7. The distal aortogram demonstrates what appears to be a relatively      smooth aorta.  The left kidney appears to be higher than the right      kidney and that is similar to 2006. The takeoff of the right renal      artery appears to be patent, and is relatively well seen.  The left      renal artery comes off behind other vessels, but there is brisk      filling of the vessel and an obvious blush in the kidney.  There is      no evidence of obvious high-grade stenosis.   CONCLUSION:  1. Moderate reduction in left  ventricular function with an inferobasal      wall motion abnormality due to prior inferior wall infarction.  2. Chronic total occlusion of the right coronary artery with the      diffusely diseased distal vessel that fills by left-to-right      collaterals.  3. Continued patency of the circumflex stent with a drug-eluting      platform with segmental plaque of 50-60% proximal to the stent site      that does not appear to be highly changed from the previous study,      but could potentially have some hemodynamic significance although      not likely based on multiple views.  4. Continued diffuse irregularity of the left anterior descending      artery involving the diagonals as previously noted, but without      high-grade interval change.   DISPOSITION:  This is a difficult situation.  The patient has  significant obesity.  She has had prior gastric stapling, she has sleep  apnea and cannot use a mass because of claustrophobia.  She has had  stubborn hypertension that has been under expert management from Dr.  Lorrene Reid at [facility].  She is on multidrug therapy  and has been difficult to control.  I will discuss the case with Dr.  Lorrene Reid.  I also think it would be worthwhile for her to see Dr. Gwenette Greet  back in follow-up.  Importantly, she has sleep apnea and this may be a  contributing component to the stubbornness with which her hypertension  is controlled.  I will review options.      [name]. [name], MD, [facility]  Electronically Signed     TDS/MEDQ  D:  12/21/2006  T:  12/21/2006  Job:  [id number]   cc:   Derinda Late, M.D.  Elzie Rings Lorrene Reid, M.D.  Kathee Delton, MD,FCCP

## 2010-06-16 NOTE — Assessment & Plan Note (Signed)
[facility]                            CARDIOLOGY OFFICE NOTE   JENNYLEE, DELLAROCCA                       [name]:          [id number]  DATE:02/27/2007                            DOB:          Jan 12, 1946    Ms. Weary is in for followup visit.  In general, she has continued to  have some shortness of breath and at times some chest discomfort.  Her  blood pressures remain elevated.  She got nauseated this morning, and  vomited up all of her medications.  She thinks this may be related to an  issue with gastroparesis. She thinks things just sort of sit there and  do not go through. Dr. Lorrene Reid has raised the issue of whether she may  have some delayed absorption.  She does take an extra clonidine if  necessary.  The patient's cardiac situation is relatively stable.  She  last underwent catheterization and she has a chronically occluded right  coronary artery which supplies a very small distal vessel.  The LAD has  some luminal irregularities with typical pruning as seen in diabetes.  Second diagonal has 70% narrowing.  The AV circumflex has about 50-60%  narrowing, but this is proximal to the stent, the stent itself was  widely patent.  The EF is about 45%. There is no obvious evidence of  high-grade renal artery stenosis.   Today, on examination, blood pressures 200/130.  As noted, she vomited  her medicines this morning.  The pulses 82.  The lung fields are clear  and cardiac rhythm is regular.   Her electrocardiogram demonstrates normal sinus rhythm with left atrial  enlargement, left ventricular hypertrophy.  Importantly, this very nice  lady has underlying coronary disease, but appears to be stable from that  standpoint.  Her hypertension has been nearly impossible to control and  I have had her follow up with Dr. Krystal Eaton.  She has been making  appropriate adjustments.  She is also seen Dr. Gwenette Greet, and been referred  to Dr. Janace Hoard for possible  surgery.   With regard to the surgery,  I clearly think that we have to do what is  necessary in order to accomplish the goals. Goal would be good blood  pressure management and hypertension control.  From a cardiac  standpoint, she should be stable.  She does have a drug-eluting stent in  the circumflex, and she could discontinue her Plavix for about 5 days,  although I would remain on low-dose aspirin during that period.   If she comes in for surgery, she most likely will need to remain  hospitalized.     [name]. [name], MD, [facility]  Electronically Signed    TDS/MedQ  DD: 02/27/2007  DT: 02/27/2007  Job #: [id number]   cc:   Caren Griffins B. Lorrene Reid, M.D.  Derinda Late, M.D.  Kathee Delton, MD,FCCP  Melissa Montane, M.D.

## 2010-06-16 NOTE — Consult Note (Signed)
NEW PATIENT CONSULTATION   ARDATH, LYKKEN  DOB:  [birth year]                                        December 09, 2009  CHART #:  [id number]   REASON FOR CONSULTATION:  Severe 3-vessel coronary artery disease.   CLINICAL HISTORY:  I was asked by Dr. [name] to evaluate the patient  for consideration of coronary bypass surgery.  She is a 65 year old  woman with history of diabetes and morbid obesity as well as severe  difficult to control hypertension and significant gastric emptying  problems who also has a long history of coronary disease having  undergone multiple percutaneous interventions with stents.  She has  known occlusion of the right coronary artery after prior acute  myocardial infarction and drug-eluting stent placement.  She also had  drug-eluting stent placement and subtotaled occlusion of the left  circumflex artery in 2006.  She has had worsening exertional substernal  chest tightness over the past several months.  It has been occurring  with less exertion requiring sublingual nitroglycerin.  She has had two  episodes at rest.  She underwent cardiac catheterization on November 10, 2009, to further assess her coronary arteries.  There is no gradient  across her aortic valve.  Her aortic pressure at that time was 211/96  with mean of 143.  Her left ventricular end-diastolic pressure was in  the 20-30 range.  Ejection fraction was estimated at 50% with inferior  basal akinesis.  The left main was free of significant disease.  The LAD  coursed through the apex.  There is about 50% irregular plaque in the  LAD that takeoff a major diagonal branch.  The diagonal branch itself  had about 80% stenosis.  Left circumflex had a single large marginal  branch that had about 40% stenosis leading into a previously stented  area.  The stent itself is somewhat hazy although Dr. [name] felt there  was about 30% narrowing.  There are 2 large sub-branches to the  marginal, one of which had about 70% narrowing.  The right coronary  artery was totally occluded proximally at the prior stent site with the  distal vessel filling faintly by collaterals.   REVIEW OF SYSTEMS:  GENERAL:  She denies any fever or chills.  She does  report about 40 pounds weight loss over the last year, but has been  trying to lose weight.  She does report fatigue.  EYES:  Negative.  ENT:  Negative.  ENDOCRINE:  She does have adult-onset diabetes.  She denies  hypothyroidism.  CARDIOVASCULAR:  She does have exertional substernal chest tightness.  She has exertional dyspnea.  She denies PND or orthopnea.  She has had  occasional peripheral edema.  She denies palpitations.  RESPIRATORY:  She does have a history of asthma and some wheezing.  GI:  She has had chronic GI problems for years with vomiting almost  every day.  She has been worked up by Dr. Delfin Edis as well as a GI  workup at [facility] and no definite cause of her GI problems was found  although she reports having very delayed gastric emptying.  Has reported  occasional diarrhea.  GU:  She denies dysuria and hematuria.  She does have frequent  urination.  VASCULAR:  She denies claudication and phlebitis.  NEUROLOGICAL:  She does have  history of headaches.  She occasionally  gets dizziness.  She has never had any focal weakness or numbness.  She  never had a TIA or stroke.  MUSCULOSKELETAL:  She does report significant arthritis in her knees.  PSYCHIATRIC:  She has a history of nervousness.  HEMATOLOGICAL:  She denies any history of bleeding disorders or easy  bleeding.  Allergies to penicillin, erythromycin, and amoxicillin all of  which cause rash.   MEDICATIONS:  Albuterol 90 mcg inhaler 2 puffs q.4 h. p.r.n., Advair  250/50 one puff q.12 h. p.r.n. for active asthma attacks, Ambien 10 mg  q.h.s. p.r.n. for sleep, aspirin 81 mg daily, clonidine 0.3 mg 3 times  per day, Cozaar 100 mg daily, Flovent inhaler 2 puffs  b.i.d. as needed,  Lasix 80 mg two times per day p.r.n. fluid retention, hydrocodone/APAP  5/500 p.r.n. for headache, knee, hip, and back pain.  She is on Lantus  100 units q.a.m., Lipitor 40 mg daily, lisinopril 20 mg b.i.d.,  metolazone 5 mg daily, Lopressor 100 mg b.i.d., minoxidil 15 mg b.i.d.,  Midrin 325/100/65 two at onset and one every hour after migraine  headache, Mobic 7.5 mg b.i.d. p.r.n. for osteoarthritis, nitroglycerin  ointment 2% 2 inches q.6 h. for blood pressure control, sublingual  nitroglycerin 0.4 mg p.r.n. for angina, Plavix 75 mg daily, Prevalite 4  g 2-3 scoops twice a day for cholesterol, Zantac 150 mg b.i.d., Tylenol  extra strength p.r.n. for pain, vitamin D2 50,000 units 3 times per  week, Xanax 0.5 mg t.i.d. p.r.n., Zofran 4 mg b.i.d. p.r.n. for nausea,  vitamin B12 1000 mg daily, B complex vitamin daily, multivitamin with  minerals daily, and calcium citrate 1500 mg daily.   PAST MEDICAL HISTORY:  Significant for hypertension that has been very  difficult to control.  She feels this is secondary to frequent vomiting  with poor GI absorption of her medications.  She has history of  hyperlipidemia.  She has history of diabetes.  She has a history of  gastroesophageal reflux disease.  She has a history of asthma.  She has  a history of obstructive sleep apnea.  She does not use a CPAP mask as  she has not tolerated.  She has a history of migraine headaches.  History of osteoarthritis.  She is status post multiple angioplasties  with stents for coronary artery disease.  She is status post  cholecystectomy.  Status post removal of common bile duct stone post  cholecystectomy.  She underwent abdominal hernia repair and lysis of  adhesions x2.  She has a history of hysterectomy.  History of ovarian  tumor removal.  Status post repair of right fibular fracture with rod  and pins, status post gastric stapling in 1970.   SOCIAL HISTORY:  She is unemployed and lives  with her husband.  She  never smoked.  Denies alcohol use.   FAMILY HISTORY:  Positive for cardiac disease in her father who died at  age 35 with a myocardial infarction.   PHYSICAL EXAMINATION:  Blood pressure 240/107 with a pulse of 80 and  respiratory rate of 18 and unlabored.  Oxygen saturation on room air is  96%.  She is a morbidly obese white female in no distress.  HEENT exam  shows her to be normocephalic and atraumatic.  Pupils are equal and  reactive to light and accommodation.  Extraocular muscles are intact.  Throat is clear.  Teeth are in good condition.  NECK exam shows normal  carotid pulses bilaterally.  There are no bruits.  There is no  adenopathy or thyromegaly.  Cardiac exam shows a regular rate and rhythm  with normal S1-S2.  There is no murmur, rub or gallop.  Her lungs are  clear.  Abdominal exam shows active bowel sounds.  Abdomen is soft,  obese and nontender.  There are no palpable masses or organomegaly.  There is old midline laparotomy scar.  Extremity exam shows no  peripheral edema.  Pedal pulses are palpable bilaterally.  Her skin is  warm and dry.  Neurologic exam shows her to be alert and oriented x3.  Motor and sensory exams are grossly normal.   Her height is 63 inches, weight 242 pounds, BMI 42.98.   IMPRESSION:  The patient has significant 3-vessel coronary artery  disease with worsening anginal symptoms.  Although she does not have  high-grade critical stenosis of her left anterior descending artery, I  think she does have a significant stenosis involving the left anterior  descending artery as well as diagonal branch.  The stent in her left  circumflex appears somewhat hazy to me and she may have more stenosis  than is estimated by her catheterization.  Right coronary artery is  chronically occluded and is not clear whether she has a graftable distal  vessel.  Given her disease and the fact that her anginal symptoms are  worsening and she is a  very reliable historian, I agree that it will be  best to proceed with coronary bypass surgery.  I discussed the operative  procedure with her including alternatives, benefits, and risks including  but not limited to bleeding, blood transfusion, infection, stroke,  myocardial infarction, graft failure, and death.  She understands all  this and would like to proceed.  She would like to wait until after  Christmas to have surgery performed and told her that if her symptoms  are remaining relatively stable, I think that would be okay.  She  understands that her symptoms may worsen and she may need to have  surgery done sooner.  She will plan to call our office back to schedule  surgery for the week  following Christmas.  If she has any worsening of her symptom, she will  let me know immediately.   Gilford Raid, M.D.  Electronically Signed   BB/MEDQ  D:  12/09/2009  T:  12/10/2009  Job:  [id number]   cc:   Derinda Late, M.D.

## 2010-06-16 NOTE — Assessment & Plan Note (Signed)
[place]                            CARDIOLOGY OFFICE NOTE   MARKYLA, MECCA                       [name]:          [id number]  DATE:07/26/2007                            DOB:          27-May-1945    HISTORY OF PRESENT ILLNESS:  [name] [name] is in for followup.  To  summarize, she has been followed with Dr. Lorrene Reid closely.  She has also  been seen at [facility] previously.  Dr. Lorrene Reid has her labs, have been  readjusting her medications rather carefully, and Ms. Murrey brings these  in today.  They have been adjusted on an almost every 3-4 week basis,  and she is now back on minoxidil 5 mg p.o. t.i.d..  She has a multidrug  regimen, which includes twice daily Lopressor, clonidine three times a  day, lisinopril 20 mg daily, and metolazone.  In addition, she has had  some intermittent chest pressure, and we have talked about this in some  extensive detail.  It has not gotten worse, but she is fairly limited in  what she is able to do.  She also has some chronic nausea, the etiology  of which is unclear, but she has had previous hematemesis, and has had  gastric banding procedures in the past.   PHYSICAL EXAMINATION:  VITAL SIGNS:  Her blood pressure today is 180/100  and pulse is 69.  LUNGS:  The lung fields are clear.  HEART:  The cardiac rhythm is regular.   IMPRESSION:  Ms. Sanmartin has coronary artery disease.  She has a  noncritical disease of the left anterior descending artery, previously  stented circumflex that is patent, and severe disease of the right  coronary artery with total occlusion and left-to-right collaterals.  The  distal right is relatively small in caliber and not all that favorable  for revascularization.  I doubt that she would clinically improve and be  symptomatically improved by coronary artery bypass graft surgery.   Her current situation is complicated by hypertension, and she has had an  enormously extensive evaluation both with  Dr. Lorrene Reid and at [facility].  I  have encouraged her in this regard, and will defer decisions with regard  to her antihypertensive regimen with them.  Her EKG continues to  demonstrate normal sinus rhythm with no acute changes, with left  ventricular hypertrophy, and with repolarization abnormality.  I will  see Ms. Keimig back in a couple of months.  If there is any progression  in her symptoms, she is to call me promptly.     [name]. [name], MD, [facility]  Electronically Signed    TDS/MedQ  DD: 07/26/2007  DT: 07/27/2007  Job #: [id number]   cc:   Caren Griffins B. Lorrene Reid, M.D.  Derinda Late, M.D.

## 2010-06-17 ENCOUNTER — Encounter (HOSPITAL_COMMUNITY): Payer: 59

## 2010-06-19 ENCOUNTER — Encounter (HOSPITAL_COMMUNITY): Payer: 59

## 2010-06-19 NOTE — Cardiovascular Report (Signed)
[place]  Patient:    [name] [name], [name] [name]                       [name]: [id number] Proc. Date: 06/09/99 Adm. Date:  [id number] Disc. Date: [id number] Attending:  Wadie Lessen CC:         [name]. [name], M.D. [facility]             CV Laboratory             Marylene Land, M.D.                        Cardiac Catheterization  INDICATIONS:  [name] [name] is well known to me.  She presented in March with an acute inferior wall infarction.  She was treated with primary angioplasty and stenting of a long lesion to the proximal right coronary artery.  Because of a recent abnormal stress test, she underwent repeat percutaneous catheterization which revealed in-stent re-stenosis.  She also had a distal lesion which we did not treated originally.  Because of this, we then elected to bring her back for radiation therapy.  She does have diabetes.  Risks, benefits and alternatives were discussed with the patient in detail. In addition, she was seen by Dr. Danny Lawless for pretreatment.  She was brought to the catheterization lab for further evaluation.  PROCEDURE: 1. Percutaneous stenting of the right coronary artery. 2. Percutaneous angioplasty of in-stent re-stenosis in the proximal and    mid right coronary artery with Novoste beta radiation.  DESCRIPTION OF PROCEDURE:  The patient was brought to the catheterization lab and prepped and draped in the usual fashion.  Xylocaine 1% was used for local anesthesia.  Through an anterior puncture the left femoral artery was easily entered.  An 8 French sheath was placed.  Heparin was given according to protocol.  With an appropriate ACT, ReoPro was administered.  A JR4 guiding catheter with side holes, 8 [place], was used to cannulate the right coronary artery.  Guiding catheter shots were obtained.  A 0.014 Hi-Torque Floppy wire was placed in the distal vessel.  We then placed a radius stent into a distal stenosis  proximal to the origin of the posterior descending artery, a 3 mm, 14 mm length radius stent was placed across the lesion.  We then postdilated the inside portion of the stent at the lesion site with a 3 mm maverick balloon up to 6 atmospheres.  There was marked improvement in the appearance of the artery with excellent angiographic appearance.  We then turned out attention to the stent.  We utilized a 3 mm, 15 mm length cutting balloon. The cutting balloon was used without difficulty crossing these areas of in-stent re-stenosis.  The entire area was covered without difficulty.  We did have to give liberal doses of intracoronary nitroglycerin because of some spasm.  We then did a low level inflation at the distal end of the stent with the 3-0 maverick balloon.  We then covered the treated area with a Beta-Cath system.  Of note, the new placed stent was far distal to the treated area as so was therefore not radiated.  The patient was then radiated using beta radiation with the Novoste device with the appropriate personnel from radiation oncology, including the radiation oncologist, radiation physicist, and radiation Environmental consultant.  The distal lesion was treated.  The proximal area was treated as well with an approximate  5 mm area of overlap with the radiation utilized to cover all the way up to the ostium.  The procedure was without complication.  We gave more intracoronary nitroglycerin.  There was subsequent resolution of spasm.  The patient was taken to the holding area.  HEMODYNAMICS:  The central aortic pressure was 159/78.  ANGIOGRAPHIC DATA:  There was evidence of a previously placed NIR Royal overlapping stents.  There was a 31 and 9 mm stents.  In the proximal aspect of the stent just beyond the origin of this area was about an 80% area of in-stent re-stenosis.  There was modest diffuse in-stent re-stenosis with another 80% area at the distal end of the stent.  In the distal  vessel prior to the posterior descending artery there was another 80% stenosis.  The posterior descending also had about a 40-50% area in the mid vessel. Following placement of the stent in the distal artery, the 80% stenosis was reduced to 0% with the radius stent in post treatment.  The areas of in-stent re-stenosis that were radiated were reduced to about 40% so that there was about 40% narrowing throughout the stent at the sites of cutting balloon and subsequent radiation.  Of note, the site at the most proximal end of the stent was not quite as widely open as the distal end of the stent but there was an adequate lumen in all areas.  CONCLUSIONS: 1. Successful percutaneous stenting of the distal right coronary artery as    described above. 2. Successful percutaneous angioplasty with postangioplasty beta    radiation.  DISPOSITION:  The patient will be treated with ReoPro.  She will be discharged tomorrow.  The risks of target vessel revascularization with diabetes in a long area is increased, but hopefully she will have a good long-term outcome since she does not have significant hemodynamic disease involving the left coronary system.  All of the details of this have been explained to the patient and her family.  tesio DD:  06/09/99 TD:  06/11/99 Job: [id number]

## 2010-06-19 NOTE — Discharge Summary (Signed)
NAME:  [name] [name], [name] [name]                          ACCOUNT NO.:  [id number]   MEDICAL RECORD NO.:  [id number]                   PATIENT TYPE:  OIB   LOCATION:  6702                                 FACILITY:  [facility]   PHYSICIAN:  [name]. [name], M.D.             DATE OF BIRTH:  Apr 04, 1945   DATE OF ADMISSION:  04/10/2003  DATE OF DISCHARGE:  04/11/2003                           DISCHARGE SUMMARY - REFERRING   DISCHARGE DIAGNOSES:  1. Chest pain, resolved.  2. Coronary artery disease.  3. Hypertension.  4. Obesity.  5. Diabetes mellitus, oral agents.  6. Hyperlipidemia, treated.  7. Status post cholecystectomy.   [name] is a 65 year old female with a known history of coronary artery  disease, who has undergone previous PCI of the right coronary artery.  She  has noted increased substernal chest pain over the past several months and  she was brought back to the catheterization lab on April 10, 2003, for a  relook angiography.  In the cardiac catheterization lab she was found to  have a totally occluded right coronary artery, which was chronic.  The  circumflex had a proximal 20% lesion followed by a mid 40% lesion.  The LAD  had mild irregularities with a 50% mid lesion followed by a 40% lesion just  distal to that stenosis.  The first diagonal had a 90% proximal lesion that  had what felt to be slight progression of disease, prior to that the  diagonal was a small vessel.  At this point, Dr. [name] felt that the  patient would benefit from continued medical therapy.   She remained in the hospital overnight.   Lab studies revealed BUN of 14, creatinine of 0.7, potassium of 3.7.  Hemoglobin 15.3, hematocrit 42.8.  She was afebrile with a blood pressure of  134/64.   She is to go home on her current medications.  She is not to resume her  Glucophage until April 14, 2003.  She may utilize sublingual nitroglycerin  p.r.n. chest pain.  No strenuous activities or driving for two days  and  gradually increase activity.  She will remain on a low-fat diabetic diet.  Clean cath site with soap and water, no scrubbing.  Call for questions or concerns.  She is to have follow up appointment with  Dr. [name] on May 03, 2003, and she needs to make an appointment with Dr.  Sandi Mariscal.  Dr. [name] feels that part of her problem may be gastric  neuropathy secondary to her diabetes mellitus, and that she may need GI  evaluation/treatment for this.      Joesphine Bare, P.A. [facility]                      Thomas D. [name], M.D.    LB/MEDQ  D:  04/11/2003  T:  04/12/2003  Job:  [id number]   cc:   Derinda Late, M.D.  [phone]  [place] [address]  Fax: [phone]

## 2010-06-19 NOTE — Cardiovascular Report (Signed)
[place]. [facility]  Patient:    [name] [name], [name] [name]                       [name]: [id number] Proc. Date: 04/06/99 Adm. Date:  [id number] Attending:  Wadie Lessen CC:         Cardiac Catheterization Laboratory             Marylene Land, M.D.                        Cardiac Catheterization  PROCEDURE:  Left heart catheterization, selective coronary angiography, selective left ventriculography, XTRACT procedure, percutaneous transluminal coronary angioplasty and stenting of the proximal mid-right coronary artery, and percutaneous transluminal coronary angioplasty of the posterior descending coronary artery.  CARDIOLOGIST:  [name]. [name], M.D.  INDICATIONS:  [name] [name] is a pleasant 65 year old who has a history of asthma and non-insulin-dependent diabetes mellitus.  She developed chest pain yesterday and thought it was her indigestion.  She continued to have symptoms and came to Dr. Honor Junes office today, where an electrocardiogram was astutely performed and revealed inferior ST elevation.  She was brought to the cardiac catheterization laboratory under emergency conditions.  A consent was obtained or the XTRACT trial.  The risks and benefits and possible options were discussed with the patient, and subsequently with the patients husband.  An urgent cardiac catheterization was recommended.  DESCRIPTION OF PROCEDURE:  The patient was brought to the cardiac catheterization laboratory and prepped and draped in the usual fashion.  Xylocaine 1% was used or local anesthesia.  Through an anterior puncture the right femoral artery was entered using a Smart needle after local Xylocaine.  Views of the left and right coronary arteries were then obtained in multiple angiographic projections.  A ventriculography was performed in the RAO projection.  We then gave the patient intravenous heparin.  Following the patients intravenous heparin,  the ACT did not come up, and we therefore replaced the IV.  After rechecking the ACT and confirming this, after replacing the IV, an additional 7000 units of heparin was given, and demonstrated a reduction in prolongation of the  ACT.  Because of the patients marked hypertension, we gave intravenous Lopressor. We elected not to give Integrilin, pending the results of her CBC and platelet count, and we also elected not to give it because of the patients hypertension.   Preparations were then made for an extraction atherectomy.  The ACT was pushed ust above 300.  The right coronary artery was engaged with the right coronary catheter 8-French.  A 0.014 Hi-Torque floppy wire was placed in the distal vessel.  We were unable to cross with the XTRACT device, and therefore the lesion was predilated  using a 2.0 mm balloon.  We then were able to cross into the distal vessel. Following this we redilated the proximal and mid-right coronary artery using a .0 mm balloon, and then subsequently placed a 31.0 mm and a 9.0 mm overlapping stents with a 3.0 mm NIR on [employer].  We then went ahead and placed a wire  across the PDA and dilated the PDA using a 2.5 mm balloon.  There was marked improvement in the appearance of the artery.  There was TIMI-0 flow at the beginning of the procedure and TIMI-3 at the completion of the procedure. Integrilin had been administered also when we were able to get the blood pressure down.  We also gave Plavix 300 mg during the course of the procedure.  She tolerated all of this reasonably well.  At the completion of the procedure, all  catheters were removed.  The femoral sheath was sewn into place, and the Foley catheter was placed in the catheterization laboratory under sterile conditions.  HEMODYNAMIC DATA: Initial central aortic pressure:  203/115. LV pressure:  201/27.  No gradient on pullback across the aortic valve.  RESULTS: 1. Left main  coronary artery:  Was free of critical disease. 2. Left anterior descending coronary artery:  Coursed to the apex.  There    was about a 50%, up to possibly 70% stenosis crossing the septal    perforator.  The distal vessel was without critical disease.  There was    a diagonal branch that had about 30%-50% ostial narrowing. 3. Circumflex coronary artery:  Was a fairly large vessel, providing a tiny    first marginal, and then a large second marginal.  There is some diffuse    irregularity throughout the circumflex system, with about a 30% lesion    in the large marginal that bifurcates. 4. Right coronary artery:  Was totally occluded proximally.  Following    reperfusion, this was subsequently reduced to 0% with the stent.  The    70%-80% area was also reduced to 0% with the stent.  There was some    segmental plaquing beyond the stent with about a 40% area of tapered    narrowing, followed by some more irregularity.  The posterior descending    had a 90% ostial stenosis that was dilated to 30%.  VENTRICULOGRAPHY:  In the RAO projection revealed hypokinesis of the inferobasal segment.  The ejection fraction was 66%.  CONCLUSIONS: 1. Successful percutaneous stenting of the proximal and mid-right coronary    arteries. 2. Successful percutaneous transluminal coronary angioplasty of the proximal    posterior descending vessel. 3. Successful XTRACT thrombus removal. 4. Scattered lesions of the left coronary system as described above. 5. Non-insulin-dependent diabetes mellitus.  DISPOSITION:  The patient will be treated medically.  She will get aspirin and Plavix.  She will remain on her antihypertensive medicines, which include an ACE inhibitor and a beta blocker.  She will continue this throughout the hospitalization.  Enrollment in the cardiac rehabilitation program would be recommended. DD:  04/06/99 TD:  04/07/99 Job: [id number]

## 2010-06-19 NOTE — Cardiovascular Report (Signed)
[name] [name] [name], [name] [name] NO.:  [id number]   MEDICAL RECORD NO.:  [id number]          PATIENT TYPE:  INP   LOCATION:  2907                         FACILITY:  [facility]   PHYSICIAN:  [name]. [name], M.D. [facility] OF BIRTH:  [birth year]   DATE OF PROCEDURE:  01/28/2005  DATE OF DISCHARGE:                              CARDIAC CATHETERIZATION   INDICATIONS:  [name] [name] [name] is a 65 year old woman well-known to me. She has had  previous stenting of the right coronary artery with in-stent restenosis,  radiation therapy and then subsequently reclosure. The distal right is  relatively small in collateralized. The LAD has noncritical disease. The  diagonal was fairly significantly diseased but a fairly small vessel that  may well not be suitable for grafting. Finally, she developed a ruptured  plaque in the mid-circumflex. I had the surgeon see her in consultation for  the possibility of revascularization surgery. Dr. Ricard Dillon felt that the best  approach would be to consider percutaneous intervention. Importantly, there  has also been an issue of some retching with some bleeding, not directly due  to Plavix. This has been stable since April. She is an insulin-dependent  diabetic, and with this and with a history of prior restenosis, it was felt  that use a nondrug-eluting platform would not be a reasonable alternative.  We discussed this in detail. Despite the issue of late stent thrombosis, the  feeling was that we could try to treat her with Plavix and potentially treat  her for at least a year. Risks, benefits, alternatives were extensively  reviewed with the family in detail and the patient was agreeable to proceed.   PROCEDURE:  Percutaneous stenting of the obtuse marginal branch.   DESCRIPTION OF PROCEDURE:  The patient was brought to the cath lab, prepped  and draped in the usual fashion. Through an anterior puncture, the right  femoral artery was entered using a Smart needle. A  7-French sheath was  placed. Bivalirudin was given according to protocol. The patient had been  pretreated with clopidogrel. A 3.5 Voda guiding catheter was utilized and  fit nicely in the left main. A Luge wire was placed so that we could  straighten the vessel and also potentially have a fairly smooth rail to pass  the balloon into the vessel. We initially predilated using 2.5 mm balloon,  and then subsequently we elected the use a Cypher drug-eluting platform  stent subsequently. The choice of the Cypher was largely related to concern  over whether we would have significant problems with withdrawal of the  balloon using a [employer] Taxus stent in this location. We were able  to get a 2.75 x 13 stent down to the lesion site, and this was taken to 14  atmospheres. A 3 mm Quantum Maverick balloon was then placed inside the  stent, and post dilatations was done with the highest inflation pressure  about 18 mm in the middle of the stent. The edges of the stent were also  postdilated with careful attention generated not to be beyond the edges of  the stent. The balloon  was then subsequently removed. Final angiographic  views were obtained. All catheters were removed and the patient was taken to  the holding area in satisfactory clinical condition.   ANGIOGRAPHIC DATA:  The circumflex coronary artery in the marginal location  has what appears to be a ruptured plaque. Proximal to the ruptured plaque is  an area of about 40% segmental narrowing. Because of the bend, length of  stent required and difficulty with transition into the area. We elected to  place a 13 mm length stent and the stent in the midportion for the mild  segmental plaquing was located this resulted in a marked improvement in the  appearance of the artery and some straightening of the bend. The vessel  distally had a fair amount luminal irregularity at its bifurcation as noted  on previous studies.   The final  angiographic result was judged as successful.   CONCLUSION:  Successful percutaneous stenting of the circumflex marginal  branch.   DISPOSITION:  We will follow the patient prospectively. She has a fair  amount of residual disease including the AV circumflex, occlusion of the  right, high-grade diagonal and modest left anterior descending artery  disease. Blood pressure control will also be important. She will follow up  with me and with Dr. Sandi Mariscal.      [name]. [name], M.D. [facility]  Electronically Signed     TDS/MEDQ  D:  01/28/2005  T:  01/28/2005  Job:  [id number]   cc:   Derinda Late, M.D.  Fax: [place]. Lorrene Reid, M.D.  Fax: [phone]

## 2010-06-19 NOTE — Cardiovascular Report (Signed)
[place]. [facility]  Patient:    [name] [name], [name] [name]                       [name]: [id number] Proc. Date: 02/23/00 Adm. Date:  [id number] Disc. Date: [id number] Attending:  Wadie [name] CC:         Cardiac Catheterization Laboratory  [name] [name], M.D.   Cardiac Catheterization  PROCEDURE: 1. Left heart catheterization. 2. Selective coronary arteriography. 3. Selective left ventriculography. 4. Subclavian angiography.  CARDIOLOGIST:  [name] [name]. [name] [name], M.D.  INDICATIONS:  [name] [name] is a delightful 65 year old white female with a history of non-insulin-dependent diabetes mellitus and coronary artery disease.  She initially presented in March 2001, with an acute inferior infarction, treated with primary stenting of the right coronary artery.  She developed in-stent restenosis, and subsequently underwent a repeat percutaneous intervention with radiation therapy.  She did well, but then underwent a repeat cardiac catheterization seven months later, and had a restenosis at the proximal stent site, and some narrowing distally.  She underwent a repeat dilatation distally, as well as repeat dilatation in the proximal end of the long stent.  She had been doing reasonably well, but developed prolonged chest pain last night, associated with nausea and vomiting.  She stayed at home and elected not to come to the emergency room. After calling the office this morning at 8:30 a.m., she was told to take the EMS, but decided to come by private vehicle.  She arrived in the emergency room, where her CPKs were positive, but there was not ST elevation.  She had mild residual chest pain, and was subsequently transferred to the cardiac catheterization laboratory for further evaluation.  DESCRIPTION OF PROCEDURE:  The procedure was performed from the right femoral artery using 6-French catheters.  She tolerated the procedure without complications.  I subsequently  called [name] [name], and we discussed her case.  The decision was made to treat her medically.  She has occlusion of the stent to the right coronary artery with collateralization of the somewhat small distal vessel.  Please see the plan for further discussion.  HEMODYNAMIC DATA: Initial central aortic pressure:  186/103. LV pressure:  184/21.  No gradient on pullback across the aortic valve.  Importantly, the patient was given intravenous Lopressor 5 mg x 3, to try to bring the blood pressure down.  ANGIOGRAPHIC DATA:  On plain fluoroscopy there was calcification of the LAD. 1. Left main coronary artery:  The left main coronary artery  was free of    critical disease. 2. Left anterior descending coronary artery:  The left anterior descending    coronary artery courses to the apex.  There is an approximately 50%    eccentric stenosis noted in the midvessel, overlapping the origin of    the septal perforator.  This is best seen in the LAO projections.  This    area is calcified.  The distal LAD wraps the apex. 3. Circumflex coronary artery:  The circumflex coronary artery provides    a large marginal branch that has about 30% midnarrowing.  No high-grade    occlusions are noted. 4. Right coronary artery:  The right coronary artery is totally occluded    proximally.  It fills by retrograde collaterals from the LAD system.    There is visualization of the PDA.  The distal inferior wall is supplied    by the distal LAD.  VENTRICULOGRAPHY:  The ventriculography in  the RAO projection reveals preserved overall global systolic function.  The ejection fraction is 66%. There was moderate inferobasilar hypokinesis.  The subclavian internal mammary is widely patent.  CONCLUSIONS: 1. New occlusion of the proximal right coronary artery after multiple    repeat procedures. 2. Collateralization of the distal right coronary artery from left to    right collaterals. 3. Modest disease of  the left anterior descending coronary artery system,    without high-grade stenosis.  DISPOSITION:  At the present time we will lean toward medical therapy.  The patient had her chest pain last night, and is currently pain-free.  She does not have ST elevation.  While she does have elevated CPK-MBs, the right coronary artery is occluded, and the distal vessel was relatively small, and not a great vessel for a coronary artery bypass grafting.  While a graft could be put to this, it is a predominantly a single PDA with a very small posterolateral system.  If we could treat her medically, this would be optimal. DD:  02/23/00 TD:  02/23/00 Job: [id number]

## 2010-06-19 NOTE — Op Note (Signed)
NAME:  [name] [name], [name] [name]                          ACCOUNT NO.:  [id number]   MEDICAL RECORD NO.:  [id number]                   PATIENT TYPE:  INP   LOCATION:  2899                                 FACILITY:  [facility]   PHYSICIAN:  Isabel Caprice. Hassell Done, M.D.             DATE OF BIRTH:  [birth year]   DATE OF PROCEDURE:  08/27/2003  DATE OF DISCHARGE:                                 OPERATIVE REPORT   PREOPERATIVE DIAGNOSES:  Ventral incisional hernia with adherent small bowel  in area of prior general resection.   OPERATION:  Closure of possible stricture or partial small bowel  obstruction, small bowel resection, primary anastomosis, repair of ventral  hernia with AlloDerm mesh.   SURGEON:  Isabel Caprice. Hassell Done, M.D.   ASSISTANT:  Rudell Cobb. Annamaria Boots, M.D.   ANESTHESIA:  General endotracheal.   INDICATIONS FOR PROCEDURE:  The patient is a 65 year old lady who had  previously undergone a gastroplasty in the early 1980's by Dr. Alwyn Pea  at [facility], and who has developed a midline ventral hernia.  This  had been studied with a CT scan, and showed to contain transverse colon and  small bowel.  Preoperatively a bowel prep had been done.   DESCRIPTION OF PROCEDURE:  The patient was taken to room #1 on August 27, 2003, and given general anesthesia.  Preoperatively she received Cipro,  since she has had an allergy to Penicillin in the past, and the pharmacy  recommended something other than Cefotan be given.  The abdomen was then  incised, excising the old scar, and carried down in the midline.  I  dissected around the hernia sac and mobilized it nicely.  I entered the  abdomen, and then began taking the hernia sac down.  A knuckle of small  bowel which as it turned out contained about six to 41 old sutures along  the antimesenteric border in a liner fashion, consistent with prior bowel  repair or anastomosis, was entered.  It was stuck up in the hernia and on  taking this down, one of  these little weakened areas was opened, so I had a  small enterotomy.  I went ahead and freed this up, as well as freed the  remainder of the bowel from the edge, and took this back for a ways.  Because this little section of bowel looked somewhat possibly stenotic, and  I was afraid just repairing it might leave her with a stricture, and since  previous admissions and abdominal complaints were suggestive of a partial  small bowel obstruction, I elected to free up the small bowel completely in  that area and then just resect it.  With minimal if any contamination, we  isolated the bowel outside the abdomen and packed it off with towels, and  then transected this area with the GIA, and then did a functional end-to-end  anastomosis, creating a common channel with the  5.0 cm GIA, putting stitches  in the inside to control bleeding, and then closing the common defect with  #4-0 Vicryl and with #4-0 silk in two layers.  We then changed our gloves, and this was allowed to return to the abdomen.  I then had a defect which was approximately 6.0 cm in diameter, and it was  somewhat circular or elliptical.  Because of the contamination, I elected to  close this with a running #1 Prolene, but before then placed four sutures of  #0 Prolene, well away from the edges for subsequent anchoring of an AlloDerm  mesh patch.  These were placed, placing full thickness bites.  This was done  under direct vision.  I then closed the fascial defect with running #1  Prolene from above and from below, tying in the middle, and taking care to  avoid involving the bowel.  I then sutured the AlloDerm down, using a free  needle, back-threading it all, and then using another suture from the  Prolene and then tacking these four sutures and tying them down.  They came  nicely down over the primary repair.  I then tacked the perimeter of this  piece of AlloDerm, which was a reconstituted 6.0 cm x 12.0 cm regenerative  tissue  matrix graft.  This had been rehydrated, and was placed with the  dermal side to the granular surface of the wound.  This laid in there  nicely.  There was essentially no bleeding noted.  The fatty tissue  overlying this was closed with #4-0 Vicryl, as I did close the wound in  several layers using #4-0 Vicryl.  I then closed the skin with the stapler.  The sponge and needle counts were reported as correct.  The patient seemed to tolerate the procedure well.  She was taken to the  recovery room in satisfactory condition.                                               Isabel Caprice Hassell Done, M.D.    MBM/MEDQ  D:  08/27/2003  T:  08/27/2003  Job:  [id number]   cc:   Derinda Late, M.D.  [place] [address]  Fax: [phone]   [name]. [name], M.D. [facility]

## 2010-06-19 NOTE — Assessment & Plan Note (Signed)
[place] OFFICE NOTE   [name] [name], [name] [name]                       [name]:          [id number]  DATE:12/03/2005                            DOB:          16-Apr-1945    [name] [name] is in for followup.  In general, she has been stable. She did develop  a bronchitis and was placed on prednisone.  This has been tapered, however,  recently.   PHYSICAL EXAMINATION:  Today on examination, her blood pressure was 165/90.  The pulse is 65.  The lung fields are clear and the cardiac rhythm is  regular.   EKG reveals normal sinus rhythm with no acute changes.   Overall, symptomatically, she is significantly better.  Hopefully as she  comes off the prednisone, the blood pressure will come done as she is on  multiple medicines for this and has been followed closely by Dr. Lorrene Reid.  I  will see her back in followup in 6 weeks, and she is to call and say if  there is any change in her overall status.     [name] [name]. [name], MD, [facility]  Electronically Signed    TDS/MedQ  DD: 12/03/2005  DT: 12/03/2005  Job #: [id number]

## 2010-06-19 NOTE — Op Note (Signed)
NAME:  [name] [name], [name] [name]                          ACCOUNT NO.:  [id number]   MEDICAL RECORD NO.:  [id number]                   PATIENT TYPE:  OBV   LOCATION:  0345                                 FACILITY:  [facility]   PHYSICIAN:  Isabel Caprice. Hassell Done, M.D.             DATE OF BIRTH:  1945/05/27   DATE OF PROCEDURE:  08/01/2002  DATE OF DISCHARGE:                                 OPERATIVE REPORT   PREOPERATIVE DIAGNOSIS:  Abdominal pain, nausea and vomiting, gallstones.   POSTOPERATIVE DIAGNOSIS:  Gallstones, chronic cholecystitis, incarcerated  midline hernia.   OPERATION/PROCEDURE:  1. Laparoscopic cholecystectomy with extensive laparoscopic enterolysis.  2. Take-down of the incarcerated stuck loop of bowel from midline hernia to     a piece of previously placed mesh with repair of bowel and closure of     anterior abdominal wall.   SURGEON:  Isabel Caprice. Hassell Done, M.D.   ASSISTANT:  Orson Ape. Rise Patience, M.D.   DESCRIPTION OF PROCEDURE:  [name] [name] was taken to Room #1 at [facility],  given general anesthesia.  The abdomen was prepped with Betadine and draped  sterilely.  The patient has had a previous gastroplasty back in the early  1980s and had a midline incision.  She has suffered from morbid obesity  throughout her adult life.  We used a 5 mm trocar in the right side,  slightly above the umbilicus and using the Optiview trocar, inserted this  into the abdomen and insufflated.  I immediately encountered a mass of  adhesions toward the middle of the abdomen.  I was able to place in a second  5 mm slightly up from that and through these two was able to use a Harmonic  scalpel to begin taking down adhesions to the anterior abdominal wall.  I  worked for a total of 2-1/2 hours just on adhesions, taking these down.  I  was able to get a 10 mm scope in the upper mid abdomen and continued to work  and then placed two trocars on the left side and one below and then another  10 mm below.   With these I got everything down except for a loop of bowel  pictured in the chart which appeared to be associated with small ventral  hernia.  Numerous attempts were made to take this down laparoscopically but  it appeared to be fused to mesh.  I then went ahead and did a laparoscopic  cholecystectomy.  First displacing the gallbladder and then dissecting free  the cystic duct which had numerous adhesions to the Calot's triangle.  I  freed these up and got a good look at a small skinny cystic duct and cystic  artery which I clipped.  I then clipped on the gallbladder, incised the  cystic duct, milked back, looking for stones and saw none.  I went ahead and  triply clipped the cystic duct, transected it and  then removed the  gallbladder from the gallbladder bed with electrocautery without entering  it.  Gallbladder bed showed no evidence of bleeding or bowel leaks and I  then put the gallbladder in a bag which I subsequently retrieved.   Next, I went ahead and made a midline incision about an 1-1/2 inch long over  the area where the small hernia was.  I entered through this and then took  down the preperitoneal fat adhesions and then with careful scissor  dissection, took down the loop of bowel that was stuck up in the edge of  this hernia.  This appeared to be stuck to a piece of mesh.  Once taken  down, I did not actually make a complete enterotomy but there were some  little deserosalized areas that I approximated and closed with 3-0 silk  Lembert sutures.  Bowel looked good and pink and it was reduced into the  abdomen.  I then irrigated with saline and got good clear effluent back.  No  evidence of bowel leaks.  I surveyed the abdomen and then retrieved the  gallbladder with the gallbladder bag.  I closed the midline with interrupted  #1 Prolenes.  I then went back in and reinsufflated, looked at the closure,  looked at the abdomen, saw no  loops of bowel stuck up and basically did a  complete enterolysis.  Everything appeared to be in order. I then withdrew the trocars, deflated  the abdomen and closed the incisions of which there were a total of eight  with staples.  The patient tolerated the procedure well and was taken to the  recovery room in satisfactory condition.                                                Isabel Caprice Hassell Done, M.D.    MBM/MEDQ  D:  08/01/2002  T:  08/01/2002  Job:  [id number]   cc:   Derinda Late, M.D.  [place]  [place] [address]  Fax: [phone]   [name]. [name], M.D.

## 2010-06-19 NOTE — Cardiovascular Report (Signed)
[place]  Patient:    [name] [name], [name] [name]                       [name]: [id number] Proc. Date: 01/06/00 Adm. Date:  [id number] Attending:  Wadie Lessen CC:         Marylene Land, M.D.  CV Laboratory   Cardiac Catheterization  INDICATIONS:  [name] [name] is well known to me. She presented with an acute inferior infarction in march of this year and underwent stenting of the right coronary artery.  She subsequently developed in-stent restenosis an and underwent percutaneous repeat dilatation with radiation therapy and placement of a second stent distally.  She has now done well for nearly six months, but has noted for the past month or two a mild shortness of breath with some exertional discomfort.  We did a Cardiolite study which did not suggest high grade ischemia.  She was going on vacation and she was able to do this. However, she has continued to have discomfort an and in follow-up I elected to recommend repeat cardiac catheterization.  The patient was agreeable to proceed.  PROCEDURES: 1. Left heart catheterization. 2. Selective coronary arteriography. 3. Left ventriculography. 4. Percutaneous transluminal coronary angioplasty of the proximal right    coronary artery using a 3.0 x 10 cutting balloon. 5. Percutaneous transluminal coronary angioplasty and stenting of the distal    right coronary artery using a 2.5 mm multi-length pixel stent.  DESCRIPTION OF PROCEDURE:  The patient was brought to the cath lab and prepped and draped in the usual fashion; 1% xylocaine was used for local anesthesia. Through an anterior puncture, the right femoral artery was easily entered.  We did have to press somewhat distally in order to gain access.  The patient was noted to be hypertensive immediately and intravenous Lopressor was given.  The pressure came down to 173/94.  The views of the right and left coronary arteries were then obtained in multiple  angiographic projections. Ventriculography was performed in the RAO projection.  I then discussed the case with the patient and subsequently with her family.  We elected to recommend percutaneous intervention of the right coronary artery as the left is not significantly diseased and the right supplies mostly a single posterior descending branch.  Th 6-French sheath was then exchanged for a 7-French sheath.  Heparin was given according to protocol.  Integrilin was administered according to protocol.  A JR-4 guiding catheter without side holes was utilized.  The lesion was crossed with a long 0.14 high torque floppy wire. We initially dilated the distal lesion using a 3 mm Quantum Range balloon up to about 6-7 atmospheres.  This was then removed and a 3.0 x 10 IVT cutting balloon was used to dilate the proximal area of stent restenosis. Importantly, the area was restenosed at the beginning of the stent.  The remaining area that had been previously radiated as well, was without significant recurrence.  We then dilated this area several times using the cutting balloon and this was removed.  There was evidence of a mild dissection at the distal site just distal to the previously placed distal stent and we therefore placed a 2.5 mm x 13 mm pixel stent.  This was tkaen up to 8 atmospheres, the balloon pulled back slightly and then taken up to 16 atmospheres, definitely inside the stent.  There was marked improvement in the appearance of the artery.  The patient was  without significant discomfort at this point.  All catheters were removed.  The femoral sheath was sewn into place and she was taken to the holding area in satisfactory clinical condition.  HEMODYNAMIC DATA:  The initial central aortic pressure was 214/110.  Following this, there was 173/94.  LV pressure was 183/23.  There was no gradient on pullback across the aortic valve.  ANGIOGRAPHIC DATA:  LEFT MAIN CORONARY ARTERY:  This was  free of critical disease.  LEFT ANTERIOR DESCENDING:  The LAD demonstrates about 20-30% narrowing covering the origin of the septal and diagonal; this does not appear to be high grade.  The diagonal itself has about 40% segmental narrowing in its proximal portion, compared to the mid portion of the vessel and distal portion which is slightly larger in size.  Both the diagonal and the LAD are fair sized vessels distally.  CIRCUMFLEX:  The circumflex provides one major marginal branch and then terminates as a second marginal branch.  The first marginal branch has a proximal 20% narrowing and then there is a bifurcation stenosis about 20% more distally.  The AV circumflex is without critical disease.  RIGHT CORONARY ARTERY:  The right coronary artery demonstrates a long stent from the junction of the proximal to the end of the mid vessel.  There is a second stent distally.  There is a 90% stenosis at the interface between the proximal and mid vessel, at the origin of the first stent.  Just distal to this area is a 50% area of narrowing.  Following cutting balloon therapy, both of these areas were reduced to about 20% residual narrowing.  In the distal vessel there is a 40-50% area of segmental plaquing throughout the previously placed radius stent.  Then there is a 70% narrowing more distal to the stent. Following the initial balloon dilatation, there was a small clear to auscultation dissection and this was covered using a 2.5 mm x 13 Guidant pixel stent.  There was 0% residual narrowing in this area following the stenting with perhaps 20-30% area of narrowing in the stent that precedes this area. There is about 40% narrowing in the AV circumflex beyond the origin of the posterior descending branch.  VENTRICULOGRAPHY:  Ventriculography in the RAO projection revealed preseved global systolic function.  The inferior wall moves well.  There is no segmental wall motion abnormalities.   Ejection fraction was calculated at 54%  but looks perhaps slightly better angiographically.  DISPOSITION:  The patient will be continued to be treated medically.  She has had several interventional procedures in the right coronary artery but the right coronary artery is relatively small in terms of vascular distribution and we would be reluctant to proceed with surgery for this vessel.  Hopefully, we will be able to treat her long enough to where she can get a coated stent. DD:  01/06/00 TD:  01/06/00 Job: [id number]

## 2010-06-19 NOTE — Discharge Summary (Signed)
[place]. [facility]  Patient:    [name] [name], [name] [name]                         [name]: [id number] Adm. Date:  04/06/99 Disc. Date: 04/14/99 Dictator:   Sharyl Nimrod, P.A.C. CC:         Dr. Sandi Mariscal, [place] D. [name], M.D. [facility]                           Discharge Summary  DATE OF BIRTH:  August 13, 1945  SUMMARY OF HISTORY:  [name] [name] is a 65 year old white female without prior history of cardiac problems.  Her risk factors include hypertension, diabetes, hyperlipidemia.  She presents from her primary M.D.s office with acute inferior  myocardial infarction.  Her husband reports a six-month history of exertional chest discomfort relieved with rest.  She has also had nausea and vomiting every morning for the last two years.  She has been prescribed with Aciphex.  She developed dull, constant chest discomfort yesterday around 4 p.m. followed by nausea and vomiting. No shortness of breath, diaphoresis, or radiation.  She was seen in the office nd she had inferior ST segment elevation.  She was transported directly to the catheterization laboratory.  LABORATORY DATA:  TSH 1.422.  Fasting lipids showed a total cholesterol of 200,  triglycerides 160, HDL, 32, LDL, 136, ratio 6.3.  Initial CK was 117 with an MB of 11.2.  It was repeated approximately after four hours.  CK was 316 with an MB of 52.6 with a troponin of 1.59.  On April 07, 1999, at 8:10, CKs peaked at 504 with an MB of 50.8 and a troponin of 1.53.  Subsequent enzymes and troponins were declining.  Admission sodium was 138, potassium 3.6, BUN 11, creatinine 0.6, glucose 1.42.  Throughout her admission, chemistries remained unremarkable. Glucoses remained between 100-140s.  Admission H&H was 15.7 and 432.2.  MCHC was slightly elevated at 36.5.  Platelets at 404, WBC at 12.6.  On April 09, 1999, H&H had dropped to 10.5 and 31.4 with normal indices, platelets 250, WBC  9.1.  Chest x-ray showed borderline cardiomegaly, clear lungs.  Echocardiogram on April 13, 1999 showed an EF of 65%, hypoakinesis effecting the base of the inferior wall of very limited and focal areas.  Left ventricle was dilated.  No thrombus. Left atrial was mildly dilated.  EKG on admission showed normal sinus rhythm,  right atrial enlargement, ST segment elevation, and T wave inversion inferiorly.  HOSPITAL COURSE:  [name] [name] was taken emergently to the catheterization laboratory by Dr. [name].  This was performed on April 06, 1999.  According to the progress notes, it shows a 30-50% lesion in the first diagonal, a 50-70% lesion in the mid LAD involving a septal branch.  There is a 30% lesion in the distal OM involving a marginal branch.  In the RCA, there is a 99-100% lesion in the proximal portion, 70-80% lesion in the mid portion, followed by a mid 40% lesion.  There is a distal 90% lesion in the PDA.  Angioplasty stenting was performed on the proximal and id RCA.  Angioplasty to PDA was performed.  Aortic pressures were 203/115, LV 201/27. EF was 67%.  Blood pressure came down with her medication, thus her Norvasc and  other medications were held.  CKs  and troponins were positive for myocardial infarction.  By April 07, 1999, she still remained slightly hypotensive with a blood pressure in the 80s and she was given some fluid with good response of her blood pressure.  Cardiac rehab began participating in her care assisting with ambulation and teaching.  It was also noted that when she was hypertensive, she was slightly dizzy, which was exacerbated by postural changes.  A 2-D echocardiogram was performed as previously described.  Mild mitral regurgitation, mild tricuspid regurgitation.  Diabetes coordinator also spoke with her in regards to any concerns.  Over the next several days, her activity gradually increased to the point she has been walking the halls on her  own without difficulty.  By April 14, 1999, it was felt that she could be discharged home with a diagnosis f acute inferior myocardial infarction, hypertension, non-insulin-dependent diabetes, hypertension secondary to medications, hyperlipidemia, obesity.  DISCHARGE MEDICATIONS: 1. Her Norvasc was decreased to 5 mg q.d. 2. Her Atenolol decreased to 25 mg q.d. 3. Lipitor 20 mg q.h.s. 4. Plavix 75 q.d. for four weeks. 5. Protonix 40 mg q.d. 6. Sublingual nitroglycerin as needed. 7. Enteric-coated aspirin 325 mg q.d. 8. She was asked to resume her Cozaar 50 b.i.d. 9. She can continue her albuterol and Vanceril.  DISCHARGE INSTRUCTIONS:  She was advised no lifting, driving, sexual activity, r heavy exertion until seen by the physician.  Maintain low salt/fat/cholesterol DA diet.  If she had any problems with her catheterization site, she was instructed to call immediately.  She would see Dr. [name] on May 01, 1999 at 2:45 p.m. DD:  04/14/99 TD:  04/14/99 Job: [id number]

## 2010-06-19 NOTE — Discharge Summary (Signed)
[place]  Patient:    [name] [name], [name] [name]                       [name]: [id number] Adm. Date:  [id number] Disc. Date: [id number] Attending:  Lily Lovings Dictator:   Richardson Dopp, P.A.-C. CC:         [name]. [name], M.D. [facility]  Marylene Land, M.D.   Discharge Summary  DATE OF BIRTH:  Aug 07, 1945  DISCHARGE DIAGNOSES: 1. Status post inferior non-Q-wave myocardial infarction, secondary to    occluded right coronary artery, with left to right collaterals.  Plan    medical therapy. 2. Coronary artery disease. 3. Status post acute myocardial infarction in March 2001, with stent to    the right coronary artery. 4. Cardiac catheterization on Jun 03, 1999, with partial restenosis with    percutaneous transluminal intervention and radiation. 5. Cardiac catheterization on January 06, 2000, with percutaneous transluminal    coronary angiography to proximal right coronary artery with cutting    balloon, percutaneous transluminal coronary angioplasty and stent to    the distal right coronary artery, with an ejection fraction of 54%,    residual 30%-40% left anterior descending coronary artery, 20%    circumflex x 2. 6. Diabetes mellitus. 7. Hypertension. 8. Asthma. 9. Status post multiple surgeries.  PROCEDURES PERFORMED: 1. Cardiac catheterization by Dr. [name]. Stuckey on February 23, 2000, with    the following results:  Hemodynamics:  Initial aortic pressure 186/103,    LV pressure 184/21, no gradient on pullback across the aortic valve.    Angiographic data:  Left main free of disease.  LAD 50% eccentric stenosis    in the midvessel overlapping the origin of the septal perforator.  The    circumflex provides a large marginal branch with 30% midnarrowing.    The RCA totally occluded proximally, filling by retrograde collaterals    from the LAD system. 2. Ventriculography with an ejection fraction of 66%, moderate inferobasilar  hypokinesis.  HISTORY OF PRESENT ILLNESS:  This 65 year old female with the above-noted history, reported to the emergency room on February 23, 2000, with 6/10 chest pain.  It had started the night before around 10 p.m. and it awoke her from sleep.  She had pain off and on all night.  She took three nitroglycerin without complete relief.  In the emergency room she was on IV nitroglycerin and heparin.  She noted that this pain was similar to previous pain.  She denied any shortness of breath, diaphoresis, nausea, or vomiting.  ALLERGIES:  PENICILLIN AND ERYTHROMYCIN.  PHYSICAL EXAMINATION:  GENERAL:  A pleasant female.  VITAL SIGNS:  Blood pressure 174/96, pulse 85.  HEART:  Regular rate and rhythm.  ABDOMEN:  Nontender.  NECK:  Without jugular venous distention or bruits.  LUNGS:  Clear.  EXTREMITIES:  Pulses intact, no edema.  Chest x-ray:  No congestive heart failure, mild to chronic changes.  Electrocardiogram:  Heart rate 85, normal sinus rhythm, Qs and T-wave inversions in lead III only.  LABORATORY DATA:  BUN 11, creatinine 0.4, potassium 4.  Hemoglobin 15, hematocrit 45.  HOSPITAL COURSE:  The patient was admitted for chest pain.  Her cardiac enzymes were noted to be elevated.  Initially a total CPK was 676, CPK-MB 117, and troponin I was 4.97.  This rose to 845, CPK-MB 118.3, and troponin I to 14.25.  This is where the enzymes peaked.  The last  recorded set of enzymes: total CPK 492, CPK-MB 47.4, troponin I of 8.78.  Because the patients cardiac enzymes began to rise, and she still had ongoing chest pain, she was taken to the cardiac catheterization laboratory on February 23, 2000.  The results of the cardiac catheterization are noted above.  It was decided that an attempt would be made at medical therapy.  She remained on Integrilin until that evening.  Subcutaneous heparin was started in the morning, and she was started on cardiac rehabilitation.  She felt better  overall on January 23rd.  She was started on Imdur.  Cardiac rehabilitation started working with the patient on January 24th.  She tolerated ambulation without chest pain or shortness of breath.  Her Cozaar and atenolol were both adjusted.  Cozaar was decreased to once a day and atenolol was increased to twice a day.  On January 25th she noted some twinges of chest pain which lasted for a few seconds.  Her hemoglobin was stable at 12, hematocrit 35.2, platelet count 274,000.  She ambulated with cardiac rehabilitation again without chest pain.  She was transferred to telemetry.  Subcutaneous heparin was discontinued.  On the morning of February 27, 2000, she was found to be in stable condition, and it was felt she was ready for discharge to home.  DISCHARGE MEDICATIONS: 1. Sublingual nitroglycerin p.r.n. 2. Coated aspirin q.d. 3. Plavix 75 mg q.d. 4. Lipitor 40 mg q.d. 5. Norvasc 5 mg q.d. 6. Imdur 30 mg q.d. 7. Cozaar 50 mg q.d. 8. Atenolol 25 mg b.i.d. 8. Protonix 40 mg q.d. 9. Inhalers as taken at home.  ACTIVITIES:  The patient is to stay out of work until she sees Dr. [name] in followup.  DIET:  She is to maintain a low-cholesterol diet.  INSTRUCTIONS:  She is to call the office with concerns of her groin bleeding or swelling.  FOLLOWUP:  She should see Dr. [name] in two weeks.  She is to call our office for an appointment.  LABORATORY DATA:  White count 10,100, hemoglobin 12, hematocrit 35.2, platelet count 274,000.  INR 0.9, sodium 138, potassium 3.8, chloride 107, CO2 of 25, glucose 176, BUN 11, creatinine 0.6, calcium 8.2.  Cardiac enzymes as noted above. DD:  02/27/00 TD:  02/27/00 Job: [id number]

## 2010-06-19 NOTE — H&P (Signed)
NAMEDAVAE, NORGREN                ACCOUNT NO.:  [id number]   MEDICAL RECORD NO.:  [id number]           PATIENT TYPE:   LOCATION:                               FACILITY:  [facility]   PHYSICIAN:  [name]. [name], M.D. [facility] OF BIRTH:  08-Feb-1945   DATE OF ADMISSION:  01/21/2005  DATE OF DISCHARGE:                                HISTORY & PHYSICAL   PRIMARY CARDIOLOGIST:  [name]. [name], M.D. [facility]   REASON FOR ADMISSION:  Ms. Stolar is a pleasant 65 year old female, with  longstanding history of coronary artery disease, well-known to Dr. [name],  who is now admitted for further evaluation and management of crescendo  angina pectoris.   The patient was seen by me here in the office on December 11, for complaint  of worsening chest pain.  She feels that ever since receiving cortisone  injections in early November for an asthma flare she has had increased  episodes of angina pectoris.  Initially, these were essentially correlated  with moderate exertion (i.e., walking up stairs).  In the more recent past,  however, these have also occurred at rest.  She has been taking  nitroglycerin on a near daily basis.   On December 11, we adjusted her medication regimen with up-titration of  Atenolol from 100 to 150 for better beta blockade.  She presents today with  improved blood pressure and pulse rate; however, she continues to have  breakthrough episodes of angina.  Of note, she is currently not on oral  nitrates.   ALLERGIES:  PENICILLIN, ERYTHROMYCIN.   CURRENT MEDICATIONS:  1.  Atenolol 150 mg daily.  2.  Cozaar 100 mg b.i.d.  3.  Furosemide 120 mg b.i.d.  4.  Lisinopril 20 mg b.i.d.  5.  Minoxidil 30 mg daily.  6.  Nexium 40 mg daily.  7.  Norvasc 10 mg b.i.d.  8.  Xanax 0.5 mg b.i.d.  9.  Lantus Insulin 20 units q.a.m.  10. Enteric-coated aspirin 81 mg daily.  11. Clonidine 0.3 mg daily.  12. Potassium 10 mEq b.i.d.  13. Lipitor 80 mg daily.  14. Prevalite 4 mg b.i.d.  15.  Zetia 10 mg daily.   PAST MEDICAL HISTORY:  1.  Coronary artery disease.      1.  Cardiac catheterization in April of 2006:  Scattered three-vessel          coronary artery disease without critical narrowing of the left          anterior descending; known 100% proximal occlusion at the stent site          with filling by retrograde collaterals, very small diffusely          diseased vessels, felt not suitable for grafting; diffuse distal AV          circumflex about 75% collateralizing the distal right; 70% proximal          large marginal branch followed by 60% and 70% stenosis; 80% ostial          first diagonal (insignificant vessel) - not suitable for grafting;  80% ostial second diagonal (small diffusely diseased distally);          large caliber distal left anterior descending (suitable for          grafting), but without high grade obstructive narrowing.      2.  Preserved overall left ventricular function.      3.  Status post prior multiple stenting of the small, right coronary          artery - subsequent radiation treatment - subsequent reclosure.  2.  Insulin-requiring diabetes mellitus.  3.  Hypertension.  4.  Hyperlipidemia.  5.  Morbid obesity.  6.  Asthma.  7.  Status post hysterectomy.  8.  Status post cholecystectomy.  9.  Obstructive sleep apnea.      1.  The patient does not use CPAP secondary to claustrophobia.  10. History of intermittent hematemesis.      1.  Most likely secondary to Mallory-Weiss tear; history of gastric          stapling.  11. Status post ventral hernia repair.  12. Gastroesophageal reflux disease.   REVIEW OF SYSTEMS:  As noted per HPI. Denies orthopnea or paroxysmal  nocturnal dyspnea.  Has some mild exertional dyspnea.  No recent overt  bleeding.  Remaining systems negative.   SOCIAL HISTORY:  The patient has never smoked tobacco.  Denies alcohol use.   FAMILY HISTORY:  Brother deceased at age 85, secondary to myocardial   infarction.   PHYSICAL EXAMINATION:  VITAL SIGNS:  Blood pressure 166/84, pulse 54  regular, weight 283.  GENERAL:  A 65 year old female, morbidly obese, sitting upright, but in no  apparent distress.  HEENT:  Normocephalic and atraumatic.  NECK:  Preserved bilateral carotid pulses without bruits; unable to assess  for jugular venous distention.  LUNGS:  Clear to auscultation in all fields.  HEART:  Regular rate and rhythm (S1 and S2), no significant murmurs.  ABDOMEN:  Protuberant, benign.  EXTREMITIES:  No significant pedal edema.  NEUROLOGY:  No focal deficit.   IMPRESSION:  Ms. Westland is a 65 year old female, well-known to Dr. [name],  with well delineated cardiac history and most recent cardiac catheterization  in April of 2006 revealing known total occlusion of a small right coronary  artery with left-right distal collateralization and no critical narrowing of  the left anterior descending or circumflex arteries.  She has normal left  ventricular function.   The patient presents with crescendo angina pectoris, refractory to recent  adjustment of her medication regimen with up-titration of beta blocker.  She  is requiring nitroglycerin on a near daily basis and has been having  episodes of angina both at rest as well as with exertion.   PLAN:  The patient will be admitted directly from office to [facility] for close monitoring and further evaluation.  We will continue  current medication regimen and add IV nitroglycerin and heparin.  Cardiac  markers will be cycled and plan is to proceed with cardiac catheterization  tomorrow, with Dr. [name].  The patient is agreeable with this plan and is  willing to proceed.      Gene Serpe, P.A. [facility]      Thomas D. [name], M.D. [facility]  Electronically Signed    GS/MEDQ  D:  01/21/2005  T:  01/21/2005  Job:  [id number]

## 2010-06-19 NOTE — Cardiovascular Report (Signed)
NAMEELVERTA, SHALA NO.:  [id number]   MEDICAL RECORD NO.:  [id number]          PATIENT TYPE:  OIB   LOCATION:  2899                         FACILITY:  [facility]   PHYSICIAN:  [name]. [name], M.D. [facility] OF BIRTH:  Mar 15, 1945   DATE OF PROCEDURE:  05/05/2004  DATE OF DISCHARGE:                              CARDIAC CATHETERIZATION   INDICATIONS:  [name] [name] is a 65 year old female who has insulin-dependent  diabetes.  She has had prior coronary disease with multiple stent procedures  of a small right coronary artery which ultimately underwent radiation  treatment, but nonetheless developed reclosure.  She has been treated  medically with left-to-right collaterals in a small right distal vessel.  She recently has had increasing angina.  She has also been complicated by  recurrent episodes of vomiting with probable diabetic gastropathy.  She has  had prior gastric surgery as well.  The current study was done because of an  increase in angina.  She has had some recent hemoptysis and had to stop  aspirin and Plavix.  She has also had difficult to control hypertension.   PROCEDURE:  1.  Left heart catheterization.  2.  Selective coronary arteriography.  3.  Selective left ventriculography.   DESCRIPTION OF PROCEDURE:  The patient was brought to the catheterization  laboratory after informed consent and prepped and draped in usual fashion.  Through an anterior puncture the right femoral artery was easily entered  using a Smart needle.  A 6-French sheath was placed.  Central aortic and  left ventricular pressures were measured with a pigtail.  Ventriculography  was performed in the RAO projection.  We then did a distal angiogram to look  at the renal arteries given the patient's recent hypertension.  Following  this, coronary arteriography was performed using standard Judkins catheters.  I then reviewed the films with Dr. Vicenta Aly and subsequently with the  patient's family.  She tolerated the procedure well.  I held the groin  myself for approximately 10 minutes and then the technologist took over at  that time.  There were no complications.  She was given intravenous  labetalol to bring down the blood pressure.   HEMODYNAMIC DATA:  1.  Central aortic pressure 180/86, mean 122.  2.  Left atrial pressure 184/21.  3.  No gradient on pullback across aortic valve.   ANGIOGRAPHIC DATA:  1.  Ventriculography was performed in the RAO projection.  There was      hypokinesis in the inferobasal segment.  Overall systolic function was      preserved overall.  There was no evidence of mitral regurgitation.      There was inferobasal hypokinesis.  2.  On plain fluoroscopy there is evidence of calcification predominantly in      the LAD diagonal system.  3.  The left main is free of critical disease.  4.  The LAD courses to the apex.  There is some calcification is noted in      about 30% narrowing at the takeoff of the second diagonal.  The first  diagonal is a tiny insignificant vessel not suitable for grafting and      has diffuse 80% narrowing at its ostium.  The second diagonal also is      80% narrowed at its ostium and is a small diffusely diseased vessel      distally.  The distal LAD is a large caliber vessel that is suitable for      grafting, but without a high-grade obstructive narrowing.  5.  The circumflex provides an AV circumflex that has diffuse distal      narrowing of about 75% and collateralizes the distal right.  The large      marginal branch has about a 70% eccentric lesion proximally followed by      a 60% area of disease, then a 70% area of disease about halfway to its      termination.  The distal vessel is moderate in size.  6.  The right coronary artery is totally occluded proximally at the stent      site and fills by retrograde collaterals, but appears to be a very small      diffusely diseased vessel and this also  does not appear to be suitable      for grafting.   CONCLUSION:  1.  Preserved overall left ventricular function.  2.  Significant diabetes with difficult control.  3.  Scattered three-vessel disease without critical narrowing of the left      anterior descending artery.   This is a difficult situation.  The LAD does not appear to be  hemodynamically significantly blocked.  The circumflex has multiple tandem  lesions and would not be an ideal vessel for stenting.  Dr. Vicenta Aly and I  are in agreement with this.  Neither of the diagonals is suitable for  intervention or revascularization surgery.  She is to see GI tomorrow.  We  will need to find out more about what is suitable from the standpoint of  whether she might have a Mallory-Weiss tear.  With regard to the cardiac  options, I will get a surgeon to review the films but I am not optimistic  that she is a very good candidate for surgery given the lack of critical LAD  disease, the fact that the right is tiny and not suitable for grafting, and  the fact that the circumflex marginal has multiple lesions going distally.  We will also refer her to Dr. Jamal Maes for evaluation of poorly  controlled hypertension on multidrug therapy.      TDS/MEDQ  D:  05/05/2004  T:  05/05/2004  Job:  [id number]   cc:   Derinda Late, M.D.  [place]  [place] [address]  Fax: [phone]   CV Lab   Delfin Edis, M.D. [facility]   Caren Griffins B. Lorrene Reid, M.D.  [address]  [place]  [place] [address]  Fax: [phone]

## 2010-06-19 NOTE — Assessment & Plan Note (Signed)
[place]                            CARDIOLOGY OFFICE NOTE   ELLICIA, [name] [name]                       [name]:          [id number]  DATE:03/04/2006                            DOB:          09-19-1945    [name] [name] is in for followup.  In general, she is doing reasonably well.  She saw Dr. Sandi Mariscal recently, and based on the advanced trial, he  elected, apparently, to discontinue her Zetia.  He has also asked her to  increase her metoprolol, but wanted our approval first.  The patient has  had some bradycardia on beta blockers in the past.  She is not having  ongoing chest pain, and her blood pressures have actually been somewhat  better controlled.   MEDICATIONS:  1. Albuterol inhaler 2 puffs 4 hours as needed, prescribed by Dr.      Sandi Mariscal.  2. Ambien 10 mg nightly.  3. Aspirin 81 mg daily.  4. Cozaar 100 mg 1 twice daily.  5. Crestor 20 mg 1 daily.  6. Flovent inhaler.  7. Furosemide 80 mg 1 tablet twice daily.  8. Hydrocodone/APAP for pain prescribed by Dr. Sandi Mariscal.  9. Lantus insulin prescribed by Dr. Sandi Mariscal.  10.Lisinopril 20 mg 1 tablet twice daily.  11.Lopressor 25 mg 1 tablet twice daily.  12.Minoxidil 15 mg 1 in the morning.  13.Minoxidil 7.5 mg 1 in the evening.  14.Mobic 7.5 mg 1 twice daily as needed for arthritis.  15.Nitroglycerin 0.04 mg as needed for angina.  16.Norvasc 5 mg 1 daily.  17.Omeprazole 20 mg 1 twice daily.  18.Plavix 75 mg daily.  19.Prevalite as prescribed by Dr. Sandi Mariscal.  20.Xanax 0.5 mg as prescribed by Dr. Sandi Mariscal.  21.Zetia 10 mg no longer taking.   PHYSICAL EXAMINATION:  She is an overweight female.  She is in no  distress.  Blood pressure today was 221/116, but she has multiple pressures, even  from yesterday that are excellent.  Pulse is 78.  LUNG FIELDS:  Clear.  CARDIAC:  Rhythm is regular.   Electrocardiogram demonstrates normal sinus rhythm with left ventricular  hypertrophy.   IMPRESSION:  1. Hypertension with recent journal under recent control.  2. Coronary artery disease status post percutaneous intervention.  3. Clinical coronary artery disease.  4. Gastroesophageal reflux.  5. Insulin dependent diabetes mellitus.   PLAN:  1. Return to clinic in 4 months.  2. Increase metoprolol to 25 mg 3 times daily.  3. Long discussion today regarding the Vytorin data.     [name] [name]. [name], MD, [facility]  Electronically Signed    TDS/MedQ  DD: 03/04/2006  DT: 03/04/2006  Job #: [id number]   cc:   Derinda Late, M.D.

## 2010-06-19 NOTE — Discharge Summary (Signed)
[place]. [facility]  Patient:    [name] [name], [name] [name]                       [name]: [id number] Adm. Date:  [id number] Disc. Date: 01/07/00 Attending:  Wadie [name] Dictator:   [name] [name], P.A. CC:         [name] [name], M.D.   Referring Physician Discharge [place]  DATE OF BIRTH:  [birth year]  PROCEDURES: 1. Cardiac catheterization. 2. Coronary arteriogram. 3. Left ventriculogram. 4. PTCA of one vessel and PTCA with stent of another vessel.  HOSPITAL COURSE:  [name] is a 65 year old female with a history of coronary artery disease who was seen for continued exertional dyspnea and chest tightness despite a recent Cardiolite that showed no ischemia.  She had percutaneous intervention in May 2001 with radiation of the RCA.  It was felt that her symptoms were suspicious for unstable anginal pain and restenosis in her RCA and she was admitted for catheterization.  She had a cardiac catheterization which showed a normal left main, a 30-40% LAD, and a circumflex with two 20% lesions.  The RCA had a 90% proximal stenosis and a 50% proximal stenosis.  There was 30-40% in-stent restenosis and a distal 70% stenosis.  She had PTCA of the proximal RCA with a cutting balloon that reduced the stenosis from 90% and 50% to less than 20%.  She also had PTCA with stent of the distal RCA, reducing a 70% stenosis and an additional 40% stenosis to less than 20% with TIMI 3 flow.  Her EF was 54%.  She tolerated the procedure well and the sheath was removed without difficulty.  The next day, her groin was stable and her lungs were clear.  Her post procedure enzymes were negative.  Her other labs were within normal limits and she was considered stable for discharge on January 07, 2000.  LABORATORY VALUES:  Hemoglobin 12.5, hematocrit 34.5, wbcs 8.9, platelets 258.  Sodium 141, potassium 3.6, chloride 107, CO2 29, BUN 9, creatinine 0.6, glucose 102.  Serial  CK-MB and troponin I negative for MI.  DISCHARGE CONDITION:  Stable.  CONSULTS:  None.  COMPLICATIONS:  None.  DISCHARGE DIAGNOSES: 1. Coronary artery disease, percutaneous transluminal coronary angioplasty and    stent of the distal right coronary artery with percutaneous transluminal    coronary angioplasty of the proximal right coronary artery this admission. 2. History of coronary artery disease with percutaneous transluminal coronary    angioplasty and stent of the distal right coronary artery and    brachytherapy of the proximal right coronary artery in May 2001. 3. Acute inferior myocardial infarction. 4. Hyperlipidemia. 5. Gastroesophageal reflux disease symptoms. 6. Non-insulin-dependent diabetes mellitus. 7. Hypertension. 8. Status post hysterectomy.  DISCHARGE INSTRUCTIONS:  ACTIVITY:  She is to do no driving, sexual, or strenuous activity for two days.  She can return to work in one week.  DIET:  She is to stick to a low fat diabetic diet.  WOUND CARE:  She is to call the office for bleeding, swelling, or drainage at the catheterization site.  FOLLOW-UP:  She is to follow up with [name] [name], P.A.C. for Dr. Lia [name] on December 20 at 2 p.m.  She is to follow up with Dr. Sandi [name] as needed.  DISCHARGE MEDICATIONS: 1. Coated aspirin 325 mg q.d. 2. Plavix 75 mg q.d. for a month. 3. Lipitor 20 mg q.d. 4. Protonix 40 mg q.d. 5. Atenolol 25  mg q.d. 6. Norvasc 5 mg q.d. 7. Cozaar 50 mg b.i.d. 8. Zantac 300 mg q.h.s. 9. Nitroglycerin 0.4 mg as needed. DD:  01/07/00 TD:  01/07/00 Job: [id number]

## 2010-06-19 NOTE — Cardiovascular Report (Signed)
[place]. [facility]  Patient:    [name] [name], [name] [name]                       [name]: [id number] Adm. Date:  [id number] Disc. Date: [id number] Attending:  Wadie Lessen CC:         Marylene Land, M.D.             [name]. [name], M.D. [facility]             [facility]                        Cardiac Catheterization  INDICATIONS:  Rebekha is a very pleasant 65 year old who six weeks earlier presented with an acute inferior MI.  She was subsequently treated with primary PTCA with  stenting and also with an extract catheter.  She has done well, but on exercise  testing, developed substernal chest pain without dramatic EKG changes.  The current study was done to assess coronary anatomy.  Risks, benefits and alternatives were discussed with the patient and she agreed to come to the lab for further evaluation.  PROCEDURE:  Left heart catheterization, selective coronary angiography, selective left ventriculography.  DESCRIPTION OF PROCEDURE:  The procedure was performed from the right femoral artery using 6-French catheters.  She tolerated the procedure without complication.  HEMODYNAMIC DATA:  The central aortic pressure was 171/84; LV pressure 171/31.  There was no gradient on pullback across the aortic valve.  ANGIOGRAPHIC DATA:  The left main coronary artery was free of significant disease.  The left anterior descending artery coursed to the apex.  The LAD had a stenosis of approximately 50% that involved the origin of the second diagonal branch. Likewise, the first diagonal branch had about 30-50% ostial narrowing; this did not appear to be dramatically changed.  The second diagonal branch was a small vessel. There was mild generalized calcification of the LAD and some moderate diffuse distal luminal irregularities.  The circumflex consisted predominantly of one large marginal branch.  There were minimal luminal irregularities  throughout but no significant critical stenoses.   The right coronary artery demonstrates about an 80% in-stent area of restenosis in the proximal portion of the overlapping stents.  The vessel opens up and then there is a second stenosis of about 50-60%, possibly 70%, in the midportion of the mid-stent.  At the distal end of the stent, there is another area of about 70-75% stenosis.  Distally, there is another stenosis of about 70% in the distal vessel that is unstented.  At the previous site of PTCA in the proximal portion of the  posterior descending, there is no evidence of restenosis.  The PDA has mild luminal irregularity.  The posterolateral branch is small and without critical disease.   The ventriculogram in the RAO projection reveals preserved global systolic function without wall motion abnormality.  Ejection fraction was 55.8% by contrast ventriculography.  CONCLUSIONS: 1. Improved left ventricular function from the previous study with now preserved    wall motion in the inferior wall. 2. No change in the coronary anatomy of the left coronary system. 3. In-stent restenosis involving the proximal stent, the mid-stent and the distal    stent in three separate locations as well as mild progression of disease in he    distal vessel, with no evidence of restenosis at the previous percutaneous    transluminal coronary angioplasty site.  DISPOSITION:  I plan to review the films with radiation-oncology.  Given the fact that this is predominantly single vessel, that she has diabetes and that there re multiple areas in the stent and early restenosis, we will likely recommend cutting balloon angioplasty, followed by radiation therapy; I will discuss this with my  colleagues.  DD:  06/03/99 TD:  06/05/99 Job: [id number]

## 2010-06-19 NOTE — H&P (Signed)
NAME:  [name] [name], [name] [name]                          ACCOUNT NO.:  [id number]   MEDICAL RECORD NO.:  [id number]                   PATIENT TYPE:  INP   LOCATION:  0104                                 FACILITY:  [facility]   PHYSICIAN:  Odis Hollingshead, M.D.            DATE OF BIRTH:  [birth year]   DATE OF ADMISSION:  05/10/2003  DATE OF DISCHARGE:                                HISTORY & PHYSICAL   CHIEF COMPLAINT:  Right mid and right lower quadrant abdominal pain.   HISTORY OF PRESENT ILLNESS:  [name] [name] is a 65 year old female with multiple  medical problems.  However, over the past number of weeks she has had  intermittent pains in her right mid abdomen and right lower quadrant region.  This has been associated with some nausea.  They have spontaneously  resolved.  However, she awoke with the pain earlier this morning and now it  is constant, sharp, achy type pain.  She has had some dry heaves today.  She  has passed some gas, but has not had a bowel movement today.  No dysuria.  She was seen by Dr. Sandi Mariscal today and evaluated and noted to have some  tenderness in the right lower quadrant region.  She also has a mild  elevation of her white blood cell count being 12,800.  She is noted to have  some hematuria.  He spoke with Dr. Kaylyn Lim and requested one of [place] see  her in the emergency department, so she was sent to [facility] to be  evaluated.  She denies any fever or chills.   PAST MEDICAL HISTORY:  1. Coronary artery disease, which is stable angina.  2. Myocardial infarction x2.  3. Hypertension.  4. Asthma.  5. Hypercholesterolemia.  6. Type 2 diabetes mellitus.  7. Morbid obesity.  8. Gastroesophageal reflux disease.  9. Arthritis.   PAST SURGICAL HISTORY:  1. Ovarian tumor excision and appendectomy.  2. TAH/BSO.  3. Gastric stapling procedure.  4. Laparoscopic cholecystectomy and ventral hernia repair by Dr. Hassell Done.  5. Open reduction internal fixation of lower  extremity fracture.   ALLERGIES:  1. ERYTHROMYCIN.  2. PENICILLIN.  3. QUESTIONABLE PHENERGAN.   CURRENT MEDICATIONS:  Listed on the sheet she brought from home and include:  1. Lipitor.  2. Albuterol.  3. Flovent.  4. Metformin.  5. Glipizide ER.  6. Potassium.  7. Protonix.  8. Cozaar.  9. Norvasc.  10.      Atenolol.  11.      Plavix.  12.      Celebrex.  13.      Zetia.  14.      Chlorthalidone.  15.      Vicodin p.r.n.  16.      Aspirin.  17.      Clonidine.  18.      Reglan.  19.      Nitroglycerin p.r.n.  20.  Cholestyramine.   SOCIAL HISTORY:  She is married.  She denies any tobacco use.   FAMILY HISTORY:  Positive for coronary artery disease, hypertension.   REVIEW OF SYSTEMS:  GENERAL:  She is hungry.  She has not had anything to  eat today because of the nausea.  No significant weight loss.  CARDIOVASCULAR:  She does have some exertional angina which is stable.  She  had a recent cardiac catheterization and no intervention was taken.  She  states she has had multiple stents placed in the past.  Has had multiple  cardiac catheterizations.  PULMONARY:  She has had some bronchitis in the  past.  No pneumonia or tuberculosis.  GASTROINTESTINAL:  No peptic ulcer  disease, no diverticulitis.  GENITOURINARY:  Denies kidney stones, dysuria.  ENDOCRINE:  As per HPI.  NEUROLOGIC:  Denies strokes or seizures.  HEMATOLOGIC:  Denies any known bleeding disorders or blood clots.   PHYSICAL EXAMINATION:  GENERAL:  She is a morbidly obese female, appears to  be somewhat uncomfortable.  VITAL SIGNS:  Her temperature is 100.7, blood pressure on admission was  229/107, pulse of 89.  SKIN:  Warm and dry, no jaundice.  HEENT:  Eyes:  Extraocular movements were intact.  No icterus.  NECK:  Supple without palpable masses or obvious thyroid enlargement.  RESPIRATORY:  Breath sounds are equal and clear.  Respirations are  unlabored.  CARDIOVASCULAR:  Heart demonstrates a  regular rate and rhythm.  No lower  extremity edema, no JVD noted.  ABDOMEN:  Soft and obese with a midline scar and small upper abdominal  scars.  No obvious hernia palpable.  No obvious palpable masses or  organomegaly.  Mild right lower quadrant and right mid abdominal tenderness.  Active bowel sounds noted.  Examination difficult because of the large size  of her abdomen.  EXTREMITIES:  She has a fairly good range of motion and adequate muscle  tone.  NEUROLOGIC:  She is alert and oriented and follows commands.   LABORATORY DATA:  White blood cell count here at [facility] was 10,600,  hemoglobin is 16.3.  Her BMET is normal except for a glucose of 175, total  bilirubin 1.3, other liver function tests normal.  Lipase and amylase  normal.  Urinalysis is positive for hemoglobin, has 0 to 2 red blood cells,  a few bacteria.  Chest x-ray shows no active disease.  Abdominal x-ray shows  some air and a few bowel loops, but no air fluid levels to my exam.  There  is air in the colon and in the rectum, and no free air noted.   IMPRESSION:  1. Right mid to lower quadrant abdominal pain that is now constant.  Mild     fever and leukocytosis.  Some blood in the urine.  Etiology unclear at     this time.  The differential diagnoses could include nephrolithiasis with     ureterolithiasis, large or small bowel obstruction, colitis or     diverticulitis or some form of enteritis.  2. Type 2 diabetes mellitus.  3. Hypertension.  Has not taken anti-hypertensives today, thus explaining     her blood pressure elevation at this time.  4. Coronary artery disease with stable angina.  5. Gastroesophageal reflux disease.  6. Multiple previous abdominal operations.   PLAN:  Plan to admit her to the hospital, make her n.p.o., and give her some  intravenous fluid hydration.  Order a CT scan tonight, and I have talked  with the radiologist about looking for a kidney stone or other  potential intra-abdominal pathology.  Based on the results of the CT scan, we will  continue our evaluation and treatment.                                               Odis Hollingshead, M.D.    Katina Degree  D:  05/10/2003  T:  05/10/2003  Job:  [id number]   cc:   Derinda Late, M.D.  [place] [id number]  Fax: [phone]

## 2010-06-19 NOTE — Consult Note (Signed)
[name] [name], [name] NO.:  [id number]   MEDICAL RECORD NO.:  [id number]          PATIENT TYPE:  INP   LOCATION:  2907                         FACILITY:  [facility]   PHYSICIAN:  Sol Blazing, M.D.DATE OF BIRTH:  January 30, 1946   DATE OF CONSULTATION:  DATE OF DISCHARGE:                                   CONSULTATION   PRIMARY PHYSICIAN:  [name]. [name], M.D.   REASON FOR CONSULTATION:  Blood pressure management.   HISTORY:  The patient is a 65 year old white female with a long history of  refractory hypertension, coronary disease and diuretic-dependent lower  extremity edema.  She has a normal ejection fraction and her last heart  catheterization in April 2006 showed known right coronary artery occlusion,  diffuse small-vessel disease not suitable for grafting, distal circumflex  disease 75%, and significant marginal disease as well, 70 and 80% ostial  lesions in the first and second diagonal.  She also has insulin-dependent  diabetes and obesity, obstructive sleep apnea.  She had crescendo angina and  was admitted on December 21 by Dr. [name] for angina control and possible  heart catheterization.  She had been taking daily nitroglycerin.  She is  followed by Elzie Rings. Lorrene Reid, M.D., of the [facility] office for blood  pressure management and takes five blood pressure medications and Lasix.  Minoxidil was the most recent addition several months ago, and that has  resulted in improvement, according to the patient, in her systolic blood  pressure, from 63 range down to 160-170 range.   The patient was admitted and put on IV nitroglycerin.  The day after  admission she received her blood pressure medications including a single  dose of 30 mg of minoxidil on the morning of December 22.  Then subsequently  she had acute hypotension and blood pressure in the 70/50 range.  She was  put in ICU and put on IV dopamine and given a fluid bolus with gradual  improvement in blood pressure, currently is 102/50.  All her blood pressure  medicines have been held and the IV nitroglycerin is also off.  She has no  chest pain currently.   PAST MEDICAL HISTORY:  1.  Coronary artery disease as above.  2.  Diabetes mellitus 1.  3.  Hypertension.  4.  Obesity.  5.  Asthma.  6.  OSA, does not use CPAP due to claustrophobia.  7.  History of Mallory-Weiss tear.  8.  History of gastric stapling.  9.  Ventral hernia repair.  10. Hysterectomy.  11. Cholecystectomy.  12. Hyperlipidemia.   HOME MEDICATIONS:  1.  Atenolol 150 mg daily.  2.  Cozaar 100 mg b.i.d.  3.  Furosemide 120 mg b.i.d.  4.  Lisinopril 20 mg b.i.d.  5.  Minoxidil 30 mg daily.  6.  Norvasc 10 mg b.i.d.  7.  Nexium.  8.  Zantac.  9.  Insulin.  10. Aspirin.  11. Clonidine 0.3 mg daily.  12. Potassium 10 mEq daily.  13. Lipitor.  14. Prevalite  15. Zetia.   ALLERGIES:  PENICILLIN and ERYTHROMYCIN.  SOCIAL HISTORY:  No tobacco or alcohol use.   FAMILY HISTORY:  Noncontributory.   REVIEW OF SYSTEMS:  No fever, chills, sweats, no sore throat or trouble  swallowing.  No shortness of breath, orthopnea.  No nausea, vomiting or  diarrhea.  No dysuria or frequency or difficulty voiding.  No myalgia,  arthralgia.  No focal numbness or weakness.   PHYSICAL EXAMINATION:  VITAL SIGNS:  Blood pressure is currently 120/80,  pulse 60, respirations 16, weight 283.  GENERAL:  This is a morbidly obese female in no distress, sitting up in the  bedside chair.  SKIN:  Without rash.  HEENT:  PERRL, EOMI.  Throat is clear.  NECK:  Supple without JVD.  CHEST:  Clear throughout.  CARDIAC:  Regular rate and rhythm without murmur, rub, or gallop.  ABDOMEN:  Obese, protuberant, nontender, no organomegaly.  EXTREMITIES:  2+ pitting edema in the ankles to the midcalf bilaterally.  NEUROLOGIC:  No focal deficits.   Labs today:  BUN 140, creatinine 3.9, CO2 25, BUN 26, creatinine 1.5.   Creatinine on admission was 0.7.  Calcium 8.1.  Hemoglobin 12, white blood  count 10,000, platelets normal.  Urine output 1000 mL over the last eight  hours.   IMPRESSION:  1.  Acute hypotension.  This is most likely due to a very large dose of      minoxidil in a patient who is receiving IV nitroglycerin at the same      time.  The patient is not sure if she is taking 30 mg one time a day at      home or in divided doses; it is typically given in divided doses at 10-      15 mg b.i.d. at maximum.  It is a very strong vasodilator and would      interact with the nitroglycerin to cause acute hypotension.      Nevertheless, the blood pressure is improving and the question for the      nephrologist is how to introduce the blood pressure medication.  First,      I would start with the Lasix because she clearly has significant edema,      and then next I would add the Norvasc and beta blocker.  Following that      I would reintroduce minoxidil at 15 mg twice a day and then lastly would      reintroduce her ACE inhibitor and ARB.  2.  Mild acute renal insufficiency secondary to hemodynamic changes.  This      should stabilize and resolve.  3.  Chronic lower extremity edema.  I wonder if this is related to her      untreated obstructive sleep apnea and right heart failure, because the      left ventricular function is preserved and her creatinine is normal at      baseline.  4.  Coronary artery disease with crescendo angina, appears to be resolved.  5.  Obesity.  6.  Refractory hypertension, followed by Dr. Lorrene Reid at [facility].   RECOMMENDATIONS:  I am restarting the Lasix and the Norvasc.  A different  beta blocker has been started by Dr. [name].  She could be transitioned to  her atenolol at 50 mg b.i.d. and then eventually to the 150 mg daily.  Will  follow.      Sol Blazing, M.D.  Electronically Signed    RDS/MEDQ  D:  01/23/2005  T:  01/26/2005   Job:  [id number]   cc:   [name]. [name], M.D. [facility]  [address] [place]  [place] [address]

## 2010-06-19 NOTE — Cardiovascular Report (Signed)
[place]  Patient:    [name] [name], [name] [name]                         [name]: [id number] Proc. Date: 06/30/00 Attending:  [name]. [name], M.D. [facility] CC:         Cardiac Catheterization Lab   Cardiac Catheterization  INDICATIONS:  [name] [name] is a 65 year old female who presents to Lucile Shutters, M.D., also, who is well known to me.  She has had a prior acute myocardial infarction treated with stenting.  She subsequently developed restenosis and was treated with radiation.  She re-occluded her RCA which has a fairly small distal distribution.  She has had mild to moderate disease of the LAD, but it has not been critical.  Lately, she has had increasing symptoms.  As a result of this, we saw her in the office and felt that she needed a restudy.  She was brought to the catheterization lab for further evaluation.  PROCEDURE: 1. Left heart catheterization. 2. Selective coronary arteriography. 3. Selective left ventriculography. 4. Subclavian angiography.  DESCRIPTION OF PROCEDURE:  The procedure was performed from the right femoral artery using 6-French catheters.  She tolerated the procedure without complication.  HEMODYNAMIC DATA:  The central aortic pressure was 155/81 with a mean of 113. LV pressure was 167/9 with an LVEDP of 24.  There was no gradient on pullback across the aortic valve.  ANGIOGRAPHIC DATA: 1. The left main coronary artery was free of critical disease. 2. The LAD demonstrated some calcification proximally in the mid vessel near a    septal and second diagonal.  There is an eccentric stenosis that is    relatively smooth.  It is best seen in the LAO cranial view.  It measures    about 50% in luminal reduction.  It also involves a second diagonal branch    which itself has about 60-70% diffuse narrowing in the proximal portion.    The distal LAD wraps the apex, and the LAD provides collaterals to the    distal right circulation. 3. The  circumflex provides a very large marginal branch that has mild luminal    irregularities but no critical lesions.  There is some irregularity also    involving the AV circumflex going into a distal second OM, but this does    not appear to be critical. 4. The right coronary artery is totally occluded proximal just prior to the    stents.  There is filling of the distal vessel which is relatively small    in the distal distribution.  Ventriculography in the RAO projection reveals hypokinesis of the inferobasal segment.  Ejection fraction today calculates at 64.9%.  CONCLUSIONS: 1. Well-preserved overall LV function. 2. Mild inferobasal hypokinesis, EF of 64.9%. 3. Patent subclavian artery. 4. Total occlusion of the right coronary artery at the previous site of    stenting and radiation. 5. Moderate disease of the mid left anterior descending artery which does not    appear to be flow-limiting.  DISPOSITION:  I plan to review the options very carefully with the patient.  I am leaning in the direction of recommending a regular exercise program and also possibly EECP.  I would prefer this generally to revascularization surgery if we can avoid this at this time.  If not, she may require this.  I plan to review the films with my surgical colleagues.  Dr. Olevia Perches and I have reviewed the  films, and we are in agreement that the LAD does not appear to be flow-limiting. DD:  06/30/00 TD:  06/30/00 Job: [id number]

## 2010-06-19 NOTE — Letter (Signed)
Jun 07, 2006    Caren Griffins B. Lorrene Reid, M.D.  [address]  [place], [place] [address]   RE:  [name] [name]  [name]:  [id number]  /  DOB:  [birth year]   Dear Jenny Reichmann:   I had the pleasure of seeing [name] in the office today on a  follow-up evaluation.  As you know, she has been followed closely by  [facility].  She has coronary artery disease and has had stenting of the  circumflex.  She has had a known total occlusion of the right coronary  artery with prior stenting of this vessel as well, but late closure.  Overall, she has generally done reasonably well.  Her blood pressure as  of late has been higher, and largely she has been vomiting every  morning, supposedly right after she takes her pills.  Structural  evaluation has not been too revealing.  Her chest pain has not advanced  over the mild chest pain that she chronically has, as well as shortness  of breath.   PHYSICAL EXAMINATION:  VITAL SIGNS:  Today on examination the blood  pressure was 210/100, pulse 81.  When rechecked by me the blood pressure  was 160/80.  LUNGS:  Lung fields were clear.  HEART:  Cardiac rhythm was regular.  NECK:  There were no carotid bruits.   Her electrocardiogram demonstrated a normal sinus rhythm, left  ventricular hypertrophy and repolarization abnormalities.   This nice lady is stable.  She questioned whether there would be any  benefit of taking her off of all of her medicines and slowly  reintroducing them one by one.  She wonders whether the medicines  themselves could be causing the nausea.  This would be a difficult  titration and quite likely would probably require hospitalization to  achieve.  I have asked her to speak with you about this, in particular  because of both your pharmacology background as well as experience in  nephrology.  I did not make any major changes in her medications and her  blood pressure did actually come down quite nicely after she sat for  some time.  We will see her  back in followup in four to six months, from  a cardiac standpoint, and at the present time, based on the lack of  progression of symptoms, I would not be inclined to restudy her.   Thanks for your participation in her care, and I will send this note  also to [place], who follows her closely.    Sincerely,      [name]. [name], MD, [facility]  Electronically Signed    TDS/MedQ  DD: 06/07/2006  DT: 06/07/2006  Job #: [id number]   CC:    Derinda Late, M.D.

## 2010-06-19 NOTE — Assessment & Plan Note (Signed)
[place]                            CARDIOLOGY OFFICE NOTE   CHYRL, POMROY                       [name]:          [id number]  DATE:01/17/2006                            DOB:          [birth year]    [name] is in for followup. She has had some chest discomfort, but has  certainly not progressed at all. Her blood pressure has been under  reasonably control. There has been a little bit of shortness of breath.  She has had some recurrence of her chest discomfort since the last  visit, she says it really has not progressed much in general.   PHYSICAL EXAMINATION:  Blood pressure is 142/88, pulse is 85. The lung  fields are clear. The cardiac rhythm is regular. Her weight is stable at  257 pounds.   [name] overall continued to remain stable. If she would have a  significant progression of symptoms then certainly we would recommend a  repeat catheterization. She has been seen by the surgeon's who  recommended a nonsurgical approach to her coronary disease and  preferably a percutaneous intervention. This has been accomplished.  Whether this is a problem is not clear, but her symptoms have remained  stable and we know that she does have a totally occluded right. We will  therefore continue to treat her medically at the present time and I will  see her back in followup in a couple of months. Should she have any  problems in the interim, she is to call us.     [name]. [name], MD, [facility]  Electronically Signed    TDS/MedQ  DD: 01/17/2006  DT: 01/17/2006  Job #: [id number]

## 2010-06-19 NOTE — Discharge Summary (Signed)
NAME:  [name] [name], [name] [name]                          ACCOUNT NO.:  [id number]   MEDICAL RECORD NO.:  [id number]                   PATIENT TYPE:  INP   LOCATION:  4702                                 FACILITY:  [facility]   PHYSICIAN:  Isabel Caprice. Hassell Done, M.D.             DATE OF BIRTH:  July 18, 1945   DATE OF ADMISSION:  08/27/2003  DATE OF DISCHARGE:                                 DISCHARGE SUMMARY   ADMISSION DIAGNOSIS:  Ventral hernia, incarcerated.   POSTOPERATIVE DIAGNOSIS:  Ventral hernia, incarcerated, status post  laparotomy, small bowel resection, repair of ventral hernia with AlloDerm  regenerative tissue matrix (6 x 12 cm).   HOSPITAL COURSE:  [name] [name] is a 65 year old lady who has had previous  gastric bypass surgery many years ago and a laparoscopic cholecystectomy and  complicated by retained common duct stone requiring ERCP, and with ventral  hernia and questionable partial small bowel obstruction.  She was taken to  the OR on August 27, 2003, after an outpatient bowel prep, at which time she  was found to have small bowel that had been previously repaired with silks,  and it was kind of an attenuated, strictured area, and therefore this area  was removed and then reanastomosed.  The hernia was repaired primarily but  with an onlay of AlloDerm regenerative tissue matrix.  Postoperatively she  was maintained on telemetry.  She had some nausea which slowed her progress,  but she gradually improved and her diet was advanced and she was ready for  discharge on August 2.  At time she will be given Vicodin to take for pain,  Phenergan to take by mouth if she gets nauseated, her staples will be  removed, and Steri-Strips will be applied.  She will be followed up in the  office in two to three weeks.                                                Isabel Caprice Hassell Done, M.D.    MBM/MEDQ  D:  09/03/2003  T:  09/03/2003  Job:  [id number]   cc:   Derinda Late, M.D.  [place]  [place] [address]  Fax: [phone]

## 2010-06-19 NOTE — Discharge Summary (Signed)
[place]  Patient:    [name] [name], [name] [name]                       [name]: [id number] Adm. Date:  [id number] Disc. Date: 06/04/99 Attending:  Wadie Lessen Dictator:   Sharyl Nimrod, P.A.C. CC:         Marylene Land, M.D.             [name]. [name], M.D. [facility]                           Discharge Summary  DATE OF BIRTH:  17-Jan-1946  SUMMARY OF HISTORY:  [name] [name] is a 65 year old white female who was recently admitted on April 06, 1999 with an acute inferior wall myocardial infarction. She was treated with angioplasty and stent to the proximal mid RCA lesion and angioplasty of the proximal PDA.  She underwent stress testing with reproduction of stress discomfort and now is readmitted for a catheterization procedure.  She has a history of non-insulin-dependent diabetes mellitus, hypertension, hyperlipidemia, and obesity.  LABORATORY DATA:  On May 28, 1999, H&H were 14.7 and 42.7, normal indices, platelets 312, WBC 9.7.  Sodium 142, potassium 4.4, glucose 108, BUN 17, creatinine 0.8.  PT 11.4, PTT 25.5.  Chest x-rays and EKGs are not on the chart.  HOSPITAL COURSE:  [name] [name] underwent cardiac catheterization on Jun 03, 1999 by Dr. [name].  She had a normal left main, 50% lesion in the LAD at the diagonal, 30-50% small ostial diagonal lesion.  Circumflex was clean.  An 80% proximal RCA in-stent restenosis, 75% mid distal in-stent restenosis, 70-75% distal RCA.  Normal LV function.  Dr. [name] saw her on Jun 04, 1999 and felt that she could be discharged home with outpatient readmission for recatheterization and in-stent radiation therapy.  DISCHARGE DIAGNOSIS:  Unstable angina with positive stress test.  Recent inferior myocardial infarction with multiple angioplasty stents to the right coronary artery, history as previously.  DISCHARGE MEDICATIONS: 1. Plavix 75 mg q.d. 2. Norvasc 5 mg q.d. 3. Lipitor 20 mg q.h.s. 4. Atenolol 25  mg q.d. 5. Cozaar 50 mg b.i.d. 6. Protonix 40 mg q.d. 7. Sublingual nitroglycerin p.r.n.  DISCHARGE INSTRUCTIONS:  She was instructed no lifting, driving, sexual activity, or heavy exertion for two days.  Maintain low salt/fat/cholesterol diet.  If she has any problems with her catheterization site, she was asked to call immediately.  She will report to the short-stay center C at 11:30 a.m. on Tuesday to undergo catheterization radiation treatment with Dr. [name] on Tuesday, Jun 09, 1999, at 1:30 p.m. DD:  06/04/99 TD:  06/04/99 Job: [id number]

## 2010-06-19 NOTE — Discharge Summary (Signed)
[name] [name], ENDRIS NO.:  [id number]   MEDICAL RECORD NO.:  [id number]          PATIENT TYPE:  INP   LOCATION:  2002                         FACILITY:  [facility]   PHYSICIAN:  Jacqulyn Ducking, M.D. LHCDATE OF BIRTH:  1945/11/20   DATE OF ADMISSION:  01/21/2005  DATE OF DISCHARGE:  01/30/2005                                 DISCHARGE SUMMARY   PRIMARY CARDIOLOGIST:  Marcello Moores D. [name] [name], M.D. [facility].   PRINCIPAL DIAGNOSIS:  Coronary artery disease.   OTHER DIAGNOSES:  1.  Hypotensive episode secondary to overmedication.  2.  Hypertension.  3.  Hyperlipidemia.  4.  Type 2 diabetes mellitus.  5.  Morbid obesity.  6.  Asthma.  7.  Status post hysterectomy.  8.  Status post cholecystectomy.  9.  Obstructive sleep apnea.  10. History of Mallory-Weiss tear and gastric stapling.  11. Status post ventral hernia repair.  12. GERD.   ALLERGIES:  1.  PENICILLIN.  2.  ERYTHROMYCIN.   PROCEDURES:  1.  Left heart cardiac catheterization with PCI and stenting of the obtuse      marginal with drug-eluting stent on January 28, 2005.  2.  2D echocardiogram.   CONSULTS:  1.  Nephrology.  2.  Cardiovascular and thoracic surgery.   HISTORY OF PRESENT ILLNESS:  This is a 65 year old white female with a  longstanding history of CAD, followed by Dr. Lia [name], who was seen in the  office on January 21, 2005 secondary to progressive angina.  It was  determined at that point that she would be admitted for further evaluation.   HOSPITAL COURSE:  The patient ruled out for MI, and plans were made for  cardiac catheterization to take place on January 22, 2005.  Unfortunately,  after receiving minoxidil 30 mg on the a.m. of January 22, 2005, she became  markedly hypotensive, with blood pressures in the 70s, requiring IV fluid  and dopamine therapy.  Minoxidil and IV nitroglycerin were discontinued.  Her hypotension improved on dopamine and fluids, and her dopamine was  discontinued on the afternoon of January 23, 2005.  Blood pressure had  improved, and she was reinitiated back on Lopressor 25 mg b.i.d., which she  tolerated well.  She had no additional chest pain or hypotensive episodes  over the weekend, and underwent left heart cardiac catheterization on  January 26, 2005, revealing a normal left main, a 95% stenosis in the  second diagonal, a normal left circumflex, a 95% ruptured plaque in the  obtuse marginal, and a totally occluded proximal RCA, with left-to-right  collaterals.  Given her moderate diagonal and obtuse marginal disease, with  40% stenosis in the LAD, CVTS was consulted, and she was seen by Dr. Roxy Manns,  who felt that CABG would be relatively high risk and favored PCI of the  obtuse marginal.  Her films were reviewed, and the decision was made to  pursue PCI of the obtuse marginal on January 28, 2005, with subsequent  placement of a 2.75 x 13 mm Cypher drug-eluting stent.  She tolerated this  procedure well and postprocedure was transferred out  to the floor.  She has  now been ambulating without recurrent discomfort and is being discharged  home today in satisfactory condition.  Of note, following her episode of  hypotension, she did have elevation in cardiac markers to a peak CK of 236,  an MB of 17.2, and a troponin I of 1.19.  Postprocedure CK-MB was 0.8.   During this admission, Ms. Darrigo also underwent 2D echocardiogram on  January 27, 2005, revealing an EF of 55-65%, with normal wall motion, and  mildly increased left ventricular wall thickness.  Mild MR was noted.   DISCHARGE LABORATORIES:  Hemoglobin 12.8, hematocrit 36, WBC 11.4, platelets  207.  Sodium 140, potassium 3.6, chloride 106, CO2 25, BUN 14, creatinine  0.6, glucose 93, calcium 8.9, total bilirubin 0.8, alkaline phosphatase 75,  AST 29, ALT 25, total protein 5.8, albumin 3.1.  Hemoglobin A1c 6.6.  Total  cholesterol 212, triglycerides 156, HDL 40, LDL 141.    DISPOSITION:  The patient is being discharged home today in good condition.   FOLLOW-UP PLANS AND APPOINTMENTS:  She will follow up with Dr. Maren Beach  nurse practitioner or P.A. on Thursday February 04, 2005.  The patient will be  contacted with the time of that appointment.   DISCHARGE MEDICATIONS:  1.  Aspirin 325 mg daily.  2.  Plavix 75 mg daily.  3.  Nexium 40 mg b.i.d.  4.  Xanax 0.5 mg b.i.d.  5.  Lantus 70 units daily.  6.  Lipitor 80 mg daily.  7.  Zetia 10 mg daily.  8.  Questran 4 g q.12 h.  9.  Lopressor 25 mg b.i.d.  10. Cipro 250 mg b.i.d. x2 additional days.  11. Norvasc 5 mg daily.  12. Nitroglycerin 0.4 mg sublingual p.r.n. chest pain.   Notably, the patient is on significantly less antihypertensive medication  than what she presented on.  She is advised to follow her blood pressure  closely over this next week, with early follow up on Thursday February 04, 2005.  If pressures are trending high, plan would be to reintroduce ACE  inhibitor, given her history of diabetes.   OUTSTANDING LABORATORY STUDIES:  None.   DURATION OF DISCHARGE ENCOUNTER:  45 minutes, including physician time.      [name] Mire, NP      Jacqulyn Ducking, M.D. [facility]  Electronically Signed    CRB/MEDQ  D:  01/30/2005  T:  02/01/2005  Job:  [id number]   cc:   [name]. [name] [name], M.D. [facility]  [address] [place]  [place] [address]

## 2010-06-19 NOTE — Cardiovascular Report (Signed)
NAMENIRA, NILAND NO.:  [id number]   MEDICAL RECORD NO.:  [id number]          PATIENT TYPE:  INP   LOCATION:  2907                         FACILITY:  [facility]   PHYSICIAN:  [name]. [name], M.D. [facility] OF BIRTH:  Sep 18, 1945   DATE OF PROCEDURE:  01/26/2005  DATE OF DISCHARGE:                              CARDIAC CATHETERIZATION   INDICATIONS:  Ms. Macklin is a very delightful 65 year old woman well-known to  me.  She previously has undergone percutaneous revascularization with a  stent placed in the right coronary artery.  This developed in-stent  restenosis and subsequently, she required radiation therapy. She  subsequently occluded her right coronary artery it is supplied by  collaterals.  She has had chronic angina ever since.  She has also had very  difficult to control hypertension and has been followed and managed by Dr.  Krystal Eaton in a nephrology section. She has been on a variety of  medications.  Over the past two months, she has had recurrent bronchitis and  received doses of prednisone.  Associated with this, she had accelerated  symptoms with regard to angina.  She presented to the office several days  ago and was admitted with what was thought to be unstable angina and/or non-  ST-elevation MI.  With this, she was initiated on medical regimen.  She had  been on extensive medication list for difficult to control hypertension  which had been nicely managed by Dr. Lorrene Reid.  Following her medicines  several days ago, the patient developed acute hypotension and was treated  medically.  She was brought to the catheterization laboratory today for  further evaluation, after stabilization.   PROCEDURE:  1.  Left heart catheterization.  2.  Selective coronary arteriography.  3.  Selective left ventriculography.   DESCRIPTION OF PROCEDURE:  The patient was brought to the cath lab and  prepped and draped in usual fashion.  Through an anterior puncture,  the  right femoral artery was easily entered.  A 6-French sheath was placed.  Views of the left and right coronaries were obtained in multiple  angiographic projections.  Central aortic and left ventricular pressures  measured with a pigtail.  Ventriculography was performed in the RAO  projection.  Following pressure pullback, pigtail catheter was subsequently  removed.  I reviewed the old films and the present films and subsequently  discussed the case with the patient and her family.  I also discussed the  case with Dr. Mercy Moore who has seen her previously and will re-evaluate the  situation in detail.  She was taken to the holding area in satisfactory  clinical condition for sheath removal.   HEMODYNAMIC DATA:  1.  Central aortic pressure 192/95.  2.  Left ventricular pressure 182/29.  3.  No gradient on pullback across the aortic valve.  4.  Intravenous metoprolol was given during the course of procedure to bring      the blood pressure down.   ANGIOGRAPHIC DATA:  1.  Ventriculography was performed in the RAO projection.  There was      inferobasal hypokinesis.  Overall systolic function was preserved and      the ejection fraction was felt to be in excess of 50%.  There did not      appear to be significant mitral regurgitation.  2.  On plain fluoroscopy, there is evidence of a fair amount of      calcification particularly through the LAD diagonal system.  3.  The left main is free of critical disease.  4.  The left anterior descending artery courses to the apex.  There is some      calcification noted at the LAD diagonal bifurcation.  The LAD itself has      about 40% narrowing at the takeoff of the main diagonal.  The first      diagonal is a severely diseased vessel at the ostium.  The second      diagonal also has about 80% narrowing and is a small somewhat diffusely      diseased vessel that could be a possible vessel for grafting.  The      distal LAD is suitable for  grafting.  5.  The circumflex provides an AV circumflex and then a large marginal      branch.  There is diffuse segmental disease which provides collaterals      to the distal right.  Compared to the previous stud, there is now      evidence of a rupture of the marginal branch.  There is a long area of      segmental disease of about 50 to 60%, followed by a fairly focal area of      ruptured plaque of about 90% or greater.  The distal vessel was moderate      in size.  6.  The right coronary is totally occluded.  Proximally at the stent, it      site fills by retrograde collaterals.  It appears to be a small      diffusely diseased vessel, but in looking at the films from 2001, the      PDA is of moderate size and potentially could be grafted.   CONCLUSIONS:  1.  Well-preserved overall left ventricular function with an inferobasal      wall motion abnormality.  2.  High-grade disease in the diagonal, circumflex marginal, and total      occlusion of the right coronary artery.  3.  Mild/moderate plaquing of the left anterior descending artery with what      appears to be non-flow limiting disease.   DISPOSITION:  This is a difficult situation.  The patient has had some  retching and vomiting back in April.  She continues to have that possibly  related to her previous surgery and/or diabetic gastropathy.  Importantly,  she has been bringing up blood with that and was taken off of Plavix.  She  does not appear to be a good candidate for long-term Plavix therapy.  Based  on this, we have asked Dr. Mercy Moore to see her with regard to the  possibility of revascularization surgery.  This will be assessed in detail.      [name]. [name], M.D. [facility]  Electronically Signed     TDS/MEDQ  D:  01/26/2005  T:  01/26/2005  Job:  [id number]   cc:   Derinda Late, M.D.  Fax: [place] [name], M.D. [facility] [address] [place]  [place] [address]   CV Laboratory    patient's  medical record

## 2010-06-19 NOTE — Discharge Summary (Signed)
[place]. [facility]  Patient:    [name] [name], [name] [name]                       [name]: [id number] Adm. Date:  [id number] Disc. Date: 07/01/00 Attending:  Wadie Lessen Dictator:   Sharyl Nimrod, P.A.-C. CC:         [name]. [name], M.D. [facility]  Marylene Land, M.D., [address] [place], [place] [address]   Discharge Summary  DATE OF BIRTH: 04-11-1945  HISTORY OF PRESENT ILLNESS: [name] was seen in the office on Jun 17, 2000 as an add-on with Dr. Johnsie Cancel.  She had been complaining of some chest heaviness and something not right, and she was unable to pinpoint it.  She had not had any rest or nocturnal or prolonged episodes of discomfort and she had taken nitroglycerin one to two times a week with quick relief.  She had not had any associated shortness of breath, dyspnea on exertion, nausea, vomiting, or diaphoresis.  Occasionally she would have radiation to her left arm.  Her history is notable for multiple interventions on the right coronary artery. At her last heart catheterization she had a total occlusion of the RCA and it was elected to continue medical treatment.  She also had a 50% mid LAD stenosis and a 30% circumflex, with an EF of 66% and mild inferobasilar hypokinesis.  Her RCA has undergone prior stenting on two occasions as well as bradytherapy, as well as her continued medical treatment and trying to promote collateralization of her right coronary artery.  Her medical history is also notable for stomach stapling, MI x 2, hysterectomy.  LABORATORY DATA: Preadmission PT was 11.1, PTT 21.9.  Sodium 140, potassium 4.6, BUN 16, creatinine 0.6.  Hemoglobin 14.8, hematocrit 41.7, normal indices; platelets 278,000; WBC 7.6.  HOSPITAL COURSE: [name] underwent outpatient cardiac catheterization on Jun 30, 2000 and according to Dr. [name] progress note her ejection fraction was 64.9% with inferior hypokinesis.  She continued to have  chronically occluded right coronary artery with left-to-right collaterals.  She had a 50% eccentric LAD lesion in the mid portion and a 60-70% proximal diagonal lesion. Post sheath removal and bed rest she developed dry heaves and felt very uncomfortable.  She was administered Phenergan; however, she continued to have dry heaves and increased anxiety associated with tremors.  She became very anxious and after Xanax and reassurance she finally rested easily.  By the morning of Jul 01, 2000 she was ambulating without difficulty, without any further problems.  The catheterization site was intact.  Thus, Dr. [name] felt she could be discharged home.  DISCHARGE DIAGNOSES:  1. Chest discomfort of undetermined etiology.  2. Coronary artery disease as previously described.  Continued medical     treatment.  3. History as previously.  CURRENT MEDICATIONS: She is asked to continue her home medications, which include:  1. Enteric-coated aspirin 325 mg q.d.  2. Plavix 75 mg q.d.  3. Protonix 40 mg q.d.  4. Atenolol 25 mg b.i.d.  5. Cozaar 50 mg q.d.  6. Norvasc 5 mg q.d.  7. Lipitor 80 mg q.h.s.  8. Sublingual nitroglycerin as needed.  DISCHARGE ACTIVITY: She was advised on no lifting, driving, sexual activity, or heavy exertion for two days, and given permission to return to work on Monday.  DISCHARGE DIET: Maintain low-salt/low-fat/low-cholesterol diet.  DISCHARGE INSTRUCTIONS: If she has any problems with her catheterization site she was asked to  call us.  FOLLOW-UP: She will see Dr. [name] on July 06, 2000 at 4:45 p.m. DD:  07/01/00 TD:  07/01/00 Job: [id number]

## 2010-06-19 NOTE — Assessment & Plan Note (Signed)
[place]                              CARDIOLOGY OFFICE NOTE   GERALDYN, AZIZ                         [name]:          [id number]  DATE:08/31/2005                            DOB:          [birth year]    [name] [name] is in today for a followup visit.  She has had a little bit of  chest discomfort, but it has not been severe.  She does, however, notice  some.   MEDICATIONS:  1. Enteric-coated aspirin 325 mg daily.  2. Lisinopril 20 mg daily.  3. Lopressor 25 mg b.i.d.  4. Plavix 75 mg daily.  5. Prevalite 4 gm b.i.d.  6. Zetia 10 mg daily.  7. Zofran 4 mg t.i.d. p.r.n.  8. Insulin Lantus.  9. Crestor 20 mg daily.  10.Cozaar 100 mg b.i.d.  11.Furosemide 80 mg b.i.d.  12.Lantus as directed.  13.Minoxidil 15 mg b.i.d.  14.Norvasc 5 mg b.i.d.  15.Prilosec b.i.d.   PHYSICAL EXAMINATION:  VITAL SIGNS:  Her weight is 254 pounds, down fairly  substantially from 271 pounds in February.  Blood pressure today is 158/88,  pulse 74.  LUNGS:  The lung fields are clear.  CARDIAC:  Unremarkable.   ELECTROCARDIOGRAM:  The electrocardiogram demonstrates normal sinus rhythm  with inferior infarction of indeterminate age.   The patient was admitted to the hospital and developed hypotension.  She  then stabilized, and finally underwent successful percutaneous stenting of  the circumflex marginal branch after it was felt from the surgical service  that she was not a good surgical candidate.  The patient had a fair amount  of distal luminal irregularity, and some disease proximal to the site.  Should she continue to have symptoms, she might require a re-cath, but I  would like to put this office as  much as possible.  We will need to continue to follow her closely.  Dr.  Lorrene Reid will continue to follow her for her hypertension.                              [name] [name]. [name], MD, [facility]    TDS/MedQ  DD:  09/13/2005  DT:  09/13/2005  Job #:  [id number]   cc:    Derinda Late, MD  Elzie Rings. Lorrene Reid, MD

## 2010-06-19 NOTE — H&P (Signed)
[name] [name], [name] [name]                ACCOUNT NO.:  [id number]   MEDICAL RECORD NO.:  [id number]           PATIENT TYPE:   LOCATION:                               FACILITY:  [facility]   PHYSICIAN:  [name]. [name], M.D. [facility] OF BIRTH:  1945-09-07   DATE OF ADMISSION:  DATE OF DISCHARGE:                                HISTORY & PHYSICAL   ADDENDUM   Electrocardiogram today revealed sinus bradycardia at 54 BPM with left axis  deviation and evidence of OVH by voltage criteria with repolarization  abnormality.      Gene Serpe, P.A. [facility]      Thomas D. [name], M.D. [facility]  Electronically Signed    GS/MEDQ  D:  01/21/2005  T:  01/21/2005  Job:  [id number]

## 2010-06-19 NOTE — Assessment & Plan Note (Signed)
[place]                              CARDIOLOGY OFFICE NOTE   ELAISHA, BEESON                       [name]:          [id number]  DATE:11/15/2005                            DOB:          July 13, 1945    Ms. Windecker is in for a followup visit.  To basically summarize, she has been  stable.  She has had, over the past 4 weeks, some shortness of breath and  some mild chest discomfort.  She says it is not nearly what it has been in  the past.  She has had some reemergence of nausea as well.  This is a  complicated scenario.  She has a drug-eluting stent in the distal  circumflex, known total occlusion of the right coronary artery with  collateralization through a modestly-diseased LAD.  She has had multiple  catheterizations in the past.  She is on a multitude of medicines for blood  pressure control and is scheduled to see Dr. Lorrene Reid this week.   MEDICATIONS:  1. Lisinopril 20 mg daily.  2. Lopressor 25 mg b.i.d.  3. Plavix 75 mg daily.  4. Prevalite 4 g 2 scoops b.i.d.  5. Tylenol Extra Strength p.r.n.  6. Xanax 0.5 mg b.i.d.  7. Zetia 10 daily.  8. Zofran 4 mg t.i.d. p.r.n.  9. Insulin Lantus.  10.Multivitamin.  11.Cozaar 100 mg b.i.d.  12.Crestor 20 mg daily.  13.Furosemide 80 mg b.i.d.  14.Minoxidil 15 mg b.i.d.  15.Norvasc 5 mg q. day.  16.Prilosec 20 mg daily.  17.She is also taking enteric-coated aspirin 81 mg daily.   PHYSICAL EXAMINATION:  She is alert, oriented, and very pleasant female in  no acute distress.  The blood pressure is 148/82.  The pulse is 80.  The lung fields are clear.  There is a 1/6 systolic ejection murmur,  compatible with aortic valve sclerosis.  No diastolic murmurs are  appreciated.  There is no significant extremity edema.   ELECTROCARDIOGRAM:  Normal sinus rhythm with leftward oriented axis and  voltage criteria for left ventricular hypertrophy.   IMPRESSION:  1. Extensive coronary artery disease with  previous catheterization,      implantation of drug-eluting stent in January 28, 2005.  2. Insulin dependent diabetes mellitus.  3. Recurrent nausea and some intermittent chest pain.  4. Hypercholesterolemia.  5. Refractory hypertension on multiple medicines and nephrology      management.   DISPOSITION:  I plan to see her back in followup in approximately 2 weeks to  reassess her status.  She and I talked extensively today about doing a heart  catheterization.  She has had multiple of these in the past.  She has a  known occlusion of the right coronary artery with collateralization of this  vessel, and this may in part be the source of her symptoms previously.  With  the multiple catheterizations, sometimes we have and sometimes we have not  needed to take any type of action.  If she continues to have symptoms she  promised she would come to the emergency room in fairly limited provocation  should the symptoms get worse.  In the absence of that, we will see her back  in 2 weeks and make a decision about the possibility of repeating her  catheterization, but I would like to hold off if we possibly can because of  the implications from previous studies.       [name]. [name], MD, [facility]     TDS/MedQ  DD:  11/15/2005  DT:  11/16/2005  Job #:  [id number]

## 2010-06-19 NOTE — Discharge Summary (Signed)
NAME:  [name] [name], [name] [name]                          ACCOUNT NO.:  [id number]   MEDICAL RECORD NO.:  [id number]                   PATIENT TYPE:  INP   LOCATION:  [id number]                                 FACILITY:  [facility]   PHYSICIAN:  Odis Hollingshead, M.D.            DATE OF BIRTH:  [birth year]   DATE OF ADMISSION:  05/10/2003  DATE OF DISCHARGE:  05/16/2003                                 DISCHARGE SUMMARY   PRINCIPAL DISCHARGE DIAGNOSIS:  Choledocholithiasis.   SECONDARY DIAGNOSES:  1. Ventral hernia.  2. Coronary artery disease.  3. Myocardial infarction x2 in the past.  4. Hypertension.  5. Asthma.  6. Hypercholesterolemia.  7. Type 2 diabetes mellitus.  8. Morbid obesity.  9. Gastroesophageal reflux disease.  10.      Arthritis.   PROCEDURE:  ERCP on May 13, 2003.   REASON FOR ADMISSION:  This is a 65 year old female status post laparoscopic  cholecystectomy by Dr. Kaylyn Lim approximately 10 months prior to  admission, which is having biliary colic type abdominal pains.  She had a  slight increase in her white blood cell count, and I saw her in the  emergency department and admitted her to the hospital.   HOSPITAL COURSE:  She was admitted to the hospital, and total bilirubin was  slightly elevated.  She was kept NPO.  No evidence of bowel obstruction was  noted clinically.  A CT scan was performed, and it was noted to show a wide  necked ventral hernia with some small bowel and transverse colon in it, but  no obstructive lesions.  The common bile duct was about 10 mm in size.  On  the first hospital day, she was noted to have an increase of her liver  function tests.  There was concern for a choledocholithiasis, and GI consult  was obtained.  It was felt that she probable would benefit from ERCP.  On  May 13, 2003, she had an ERCP, and a small stone fragment was removed  following the sphincterotomy.  She felt much better soon after that.  She  subsequently was observed.   She had some diarrhea, and we checked a stool  for Clostridium difficile, and was negative.  Her diet was able to be  advanced from clear liquids to bland soft.  Diarrhea improved.  Diabetes and  hypertension were well controlled during the hospitalization, and she was  able to be discharged on May 16, 2003.   DISPOSITION:  Discharged home on May 16, 2003.  She was going to follow up  with Dr. Delfin Edis, as well as Dr. Kaylyn Lim.  She was to follow up with  Dr. Hassell Done to discuss the potential of hernia repair in the future.   DISCHARGE MEDICATIONS:  1. She was to continue her home medications.  2. Was given Robinul Forte, an anti-spasmodic for bowels, and Imodium as     needed for  diarrhea.   CONDITION ON DISCHARGE:  Satisfactory.                                               Odis Hollingshead, M.D.    Katina Degree  D:  05/24/2003  T:  05/24/2003  Job:  [id number]   cc:   Derinda Late, M.D.  [place]  [place] [address]  Fax: [phone]   Pricilla Riffle. Fuller Plan, M.D. [facility]

## 2010-06-19 NOTE — Cardiovascular Report (Signed)
NAME:  [name], [name]                          ACCOUNT NO.:  [id number]   MEDICAL RECORD NO.:  [id number]                   PATIENT TYPE:  OIB   LOCATION:  [id number]                                 FACILITY:  [facility]   PHYSICIAN:  [name]. [name], M.D.             DATE OF BIRTH:  19-Feb-1945   DATE OF PROCEDURE:  04/10/2003  DATE OF DISCHARGE:                              CARDIAC CATHETERIZATION   PROCEDURES PERFORMED:  1. Left heart catheterization.  2. Selective coronary arteriography.  3. Selective left ventriculography.  4. Distal aortography.  5. Subclavian angiography.   CARDIOLOGIST:  [name]. [name], M.D.   INDICATIONS:  [name] in a pleasant 65 year old who has had some increased  chest pain as of late.  She has also had some trouble following a  cholecystectomy with digestion.  She has had a known total occlusion of the  right coronary artery after undergoing multiple stent procedures and  radiation therapy; that is known to be occluded with left-to-right  collaterals.  With the increased symptoms suggestive of angina it was felt  to be difficult to assess her by radionuclide imaging and cardiac  catheterization was recommended.   DESCRIPTION OF THE PROCEDURE:  The patient was brought to the cath lab and  prepped and draped in the usual fashion.  Through the anterior puncture the  right femoral artery was easily entered.  A 6 French sheath was placed.  We  did use a Smart needle to find the vessel.  Importantly, the patient was  significantly hypertensive.  She had taken her hypertension medicines the  evening before.  Several doses of intravenous metoprolol were then  administered to try to bring down the blood pressure.  We also gave her  intravenous Vasotec.  Eventually in the holding area her blood pressure was  done into the 120s and she tolerated this well.  She was taken to the  holding area in satisfactory clinical condition.   HEMODYNAMIC DATA:  1. Central  aortic pressure 226/115.  2. Left ventricle 232/15.  3. No gradient on pullback across the aortic valve.   ANGIOGRAPHIC DATA:  1. Ventriculography in the RAO projection reveals inferobasal hypokinesis to     akinesis.  Overall systolic function is well-preserved.  Ejection     fraction is 71%.   1. Distal aortography reveals a patent right renal artery.  The left renal     artery is behind the inferior mesenteric artery and it is difficult to     see its takeoff.  We did only single plane imaging.   1. The subclavian and internal mammary appeared to be patent.   1. The left main is free of critical disease.   1. The left anterior descending artery demonstrates some luminal     irregularity in the proximal vessel.  In the midvessel overlapping the     origin of the diagonal branch there is about  40-50% narrowing.  This does     not appear to be materially changed from the previous  study.  Beyond     this there is segmental 30-40% disease as noted previously.  The diagonal     branch itself looks a bit slightly worse from the previous study and     there is a long area of segmental disease of 90% in a vessel that does     not appear to exceed 1.5 mm in size.   1. The circumflex has about 20% narrowing in the proximal bend and perhaps     40% narrowing in the midportion of the marginal.  It is otherwise free of     critical disease.   1. The right coronary artery is extensively stented and totally occluded     proximally.  There are left-to-right collaterals distally.   CONCLUSION:  1. Preserved overall left ventricular function with inferobasal wall motion     abnormality as noted on previous studies.  2. Continued to occlusion of the right coronary artery at the previous site     of coronary stenting.  3. Moderate disease of left anterior descending, which does not appear to     have significantly progressed from the previous study.  4. Modest progression of disease in a small  diagonal branch.  5. Scattered irregularities of the circumflex as described above.   DISPOSITION:  At the present time I would the continued medical approach to  therapy.  Continued weight loss would be beneficial.  A continued aggressive  approach of lipids and diabetes would also be appropriate.  Importantly,  there does appear to be some progression of disease in the diagonal;  however, previous response to percutaneous intervention has not been good  and interventional approach would only be appropriate if a drug-eluting  stent could be placed.  Her diagonal is segmentally diseased up to the LAD  takeoff and is also not of the size that would tolerate a drug-eluting  stent, therefore medical therapy would be recommended.  I doubt that she  would benefit from bypass surgery at the present time as her distal right is  also a very small diffusely diseased vessel and, at the present time  hemodynamically obstructive disease is present only in the diagonal and  distal right coronary distributions.   We will see her closely in follow up.                                               [name]. [name], M.D.    TDS/MEDQ  D:  04/10/2003  T:  04/12/2003  Job:  [id number]   cc:   Derinda Late, M.D.  [place] [address]  Fax: [phone]   [facility]

## 2010-06-19 NOTE — Discharge Summary (Signed)
[place]  Patient:    [name] [name], [name] [name]                         [name]: [id number] Adm. Date:  06/09/99 Disc. Date: 06/10/99 Attending:  [name]. [name], M.D. [facility] Dictator:   Gene Serpe, P.A.                           Discharge Summary  PROCEDURES:  PTCA/brachytherapy and stent RCA.  REASON FOR ADMISSION:  [name] [name] is a 65 year old female, status post acute IMI treated with PTCA/stent of RCA and PTCA of PDA in March 2001, who was readmitted following an abnormal GXT on Jun 03, 1999.  Coronary angiogram by Dr. Lowella Dell revealed a significant restenosis at the prior stent sites, as well as a 75% distal RCA lesion.  The plan was to have the patient return for elective PCI, including brachytherapy.  LABORATORY DATA:  Normal WBC.  Normal electrolytes and renal function.  INR at 1.1.  CPK 34/0.9 (post-PCI).  HOSPITAL COURSE:  The patient underwent coronary angiography by Dr. Lowella Dell on Jun 09, 1999 with successful PTCA dilatation of 90% in-stent restenosis to approximately 40% residual stenosis.  Additionally, radiation therapy was applied to this lesion.  Dr. [name] then proceeded with placement of a new stent of an approximate 80% distal RCA lesion to 0% residual stenosis.  The patient was kept for overnight observation and cleared for discharge the following morning.  She will be maintained on Plavix x 6 months.  DISCHARGE MEDICATIONS: 1. Plavix 75 mg q.d. 2. Coated aspirin 325 mg q.d. 3. Darvocet-N 100 1 q.4h. p.r.n. 4. Norvasc 5 mg q.d. 5. Lipitor 20 mg q.h.s. 6. Protonix 40 mg q.d. 7. Atenolol 25 mg q.d. 8. Cozaar 50 mg b.i.d. 9. Nitrostat 0.4 mg p.r.n. as directed.  DISCHARGE INSTRUCTIONS:  The patient is to refrain from heavy lifting, driving, or strenuous activity x 2 days.  She is to maintain a low fat/cholesterol diet and is to call our office if there is any swelling/bleeding at the groin.  FOLLOW-UP:  She is instructed to  schedule a followup appointment with Dr. Lowella Dell in two weeks.  DISCHARGE DIAGNOSES: 1. Coronary artery disease.    a. Status post percutaneous transluminal coronary angioplasty/brachytherapy       90% right coronary artery in-stent restenosis; stent 80% distal right       coronary artery--Jun 09, 1999.    b. Status post acute inferior myocardial infarction/stent right coronary       artery (x 2) and percutaneous transluminal coronary angioplasty to       posterior descending artery March 2001. 2. Type 2 diabetes mellitus. 3. Hypertension. 4. Hypercholesterolemia. DD:  06/10/99 TD:  06/10/99 Job: [id number]

## 2010-06-22 ENCOUNTER — Encounter (HOSPITAL_COMMUNITY): Payer: 59

## 2010-09-03 ENCOUNTER — Encounter: Payer: Self-pay | Admitting: Cardiology

## 2010-11-02 LAB — GLUCOSE, CAPILLARY
Glucose-Capillary: 163 — ABNORMAL HIGH
Glucose-Capillary: 210 — ABNORMAL HIGH

## 2010-11-06 LAB — COMPREHENSIVE METABOLIC PANEL
ALT: 26 U/L (ref 0–35)
AST: 18 U/L (ref 0–37)
Albumin: 4 g/dL (ref 3.5–5.2)
Alkaline Phosphatase: 76 U/L (ref 39–117)
BUN: 16 mg/dL (ref 6–23)
CO2: 28 mEq/L (ref 19–32)
Calcium: 9.1 mg/dL (ref 8.4–10.5)
Chloride: 102 mEq/L (ref 96–112)
Creatinine, Ser: 0.55 mg/dL (ref 0.4–1.2)
GFR calc Af Amer: >60 mL/min (ref >60)
GFR calc non Af Amer: >60 mL/min (ref >60)
Glucose, Bld: 175 mg/dL — ABNORMAL HIGH (ref 70–99)
Potassium: 3.9 mEq/L (ref 3.5–5.1)
Sodium: 138 mEq/L (ref 135–145)
Total Bilirubin: 0.9 mg/dL (ref 0.3–1.2)
Total Protein: 6.9 g/dL (ref 6.0–8.3)

## 2010-11-06 LAB — HEMOGLOBIN A1C
Hgb A1c MFr Bld: 7.2 % — ABNORMAL HIGH (ref 4.6–6.1)
Mean Plasma Glucose: 160 mg/dL

## 2010-11-06 LAB — DIFFERENTIAL
Basophils Absolute: 0 10*3/uL (ref 0.0–0.1)
Basophils Relative: 1 % (ref 0–1)
Eosinophils Absolute: 0.1 10*3/uL (ref 0.0–0.7)
Eosinophils Relative: 2 % (ref 0–5)
Lymphocytes Relative: 27 % (ref 12–46)
Lymphs Abs: 1.9 10*3/uL (ref 0.7–4.0)
Monocytes Absolute: 0.5 10*3/uL (ref 0.1–1.0)
Monocytes Relative: 7 % (ref 3–12)
Neutro Abs: 4.4 10*3/uL (ref 1.7–7.7)
Neutrophils Relative %: 63 % (ref 43–77)

## 2010-11-06 LAB — LIPID PANEL
Cholesterol: 243 mg/dL — ABNORMAL HIGH (ref 0–200)
HDL: 42 mg/dL (ref >39)
LDL Cholesterol: 173 mg/dL — ABNORMAL HIGH (ref 0–99)
Total CHOL/HDL Ratio: 5.8 RATIO
Triglycerides: 138 mg/dL (ref <150)
VLDL: 28 mg/dL (ref 0–40)

## 2010-11-06 LAB — CBC
HCT: 44.8 % (ref 36.0–46.0)
Hemoglobin: 15.4 g/dL — ABNORMAL HIGH (ref 12.0–15.0)
MCHC: 34.3 g/dL (ref 30.0–36.0)
MCV: 93 fL (ref 78.0–100.0)
Platelets: 274 10*3/uL (ref 150–400)
RBC: 4.82 MIL/uL (ref 3.87–5.11)
RDW: 12.9 % (ref 11.5–15.5)
WBC: 6.9 10*3/uL (ref 4.0–10.5)

## 2010-11-06 LAB — T4: T4, Total: 8.7 ug/dL (ref 5.0–12.5)

## 2010-11-06 LAB — TSH: TSH: 0.885 u[IU]/mL (ref 0.350–4.500)

## 2010-11-10 LAB — POCT I-STAT 3, ART BLOOD GAS (G3+)
Bicarbonate: 24.4 — ABNORMAL HIGH
O2 Saturation: 97
Operator id: 284701
Patient temperature: 98
TCO2: 26
pCO2 arterial: 37.1
pH, Arterial: 7.425 — ABNORMAL HIGH
pO2, Arterial: 85

## 2010-12-30 ENCOUNTER — Ambulatory Visit
Admission: RE | Admit: 2010-12-30 | Discharge: 2010-12-30 | Disposition: A | Discharge status: Home / Self Care | Payer: 59 | Source: Ambulatory Visit | Attending: Family Medicine | Admitting: Family Medicine

## 2010-12-30 ENCOUNTER — Other Ambulatory Visit: Payer: Self-pay | Admitting: Family Medicine

## 2010-12-30 DIAGNOSIS — R0600 Dyspnea, unspecified: Secondary | ICD-10-CM

## 2011-02-10 ENCOUNTER — Ambulatory Visit (INDEPENDENT_AMBULATORY_CARE_PROVIDER_SITE_OTHER): Payer: 59 | Admitting: Cardiology

## 2011-02-10 ENCOUNTER — Encounter: Payer: Self-pay | Admitting: Cardiology

## 2011-02-10 DIAGNOSIS — I10 Essential (primary) hypertension: Secondary | ICD-10-CM

## 2011-02-10 DIAGNOSIS — E7800 Pure hypercholesterolemia, unspecified: Secondary | ICD-10-CM

## 2011-02-10 DIAGNOSIS — I2510 Atherosclerotic heart disease of native coronary artery without angina pectoris: Secondary | ICD-10-CM

## 2011-02-10 DIAGNOSIS — E785 Hyperlipidemia, unspecified: Secondary | ICD-10-CM

## 2011-02-10 MED ORDER — METOPROLOL TARTRATE 100 MG PO TABS: 100.0000 mg | ORAL_TABLET | Freq: Two times a day (BID) | ORAL | Status: DC

## 2011-02-10 NOTE — Progress Notes (Signed)
HPI:  Just saw Dr. Lorrene Reid.  BP back up again.  BP poorly controlled, and meds adjusted by Dr. Lorrene Reid.  No current pain.  Does get short of breath when walking up hill.  Denies progressive chest pain.  Unfortunately, weight has been climbing.   Current Outpatient Prescriptions  Medication Sig Dispense Refill  . albuterol (PROVENTIL) (2.5 MG/3ML) 0.083% nebulizer solution Take 2.5 mg by nebulization as needed.        Marland Kitchen albuterol (PROVENTIL,VENTOLIN) 90 MCG/ACT inhaler Inhale 2 puffs into the lungs every 6 (six) hours as needed.        . ALPRAZolam (XANAX) 0.5 MG tablet Take 0.5 mg by mouth 3 (three) times daily.       Marland Kitchen APAP-Isometheptene-Dichloral 325-65-100 MG per capsule Take 1 capsule by mouth 4 (four) times daily as needed.      Marland Kitchen aspirin 81 MG tablet Take 81 mg by mouth daily.      . B Complex-C (B-COMPLEX WITH VITAMIN C) tablet Take 1 tablet by mouth daily.        . Calcium 1500 MG tablet Take 1,500 mg by mouth.        . Cholecalciferol (VITAMIN D3) 1000 UNITS CAPS Take 1 capsule by mouth daily.        . Ferrous Sulfate (IRON) 325 (65 FE) MG TABS Take 1 tablet by mouth daily.        Marland Kitchen FLUoxetine (PROZAC) 40 MG capsule Take 40 mg by mouth daily.      . fluticasone (FLOVENT HFA) 110 MCG/ACT inhaler Inhale 2 puffs into the lungs as needed.        . Fluticasone-Salmeterol (ADVAIR DISKUS) 250-50 MCG/DOSE AEPB Inhale 1 puff into the lungs every 12 (twelve) hours.        . furosemide (LASIX) 40 MG tablet Take 40 mg by mouth 2 (two) times daily.       Marland Kitchen gabapentin (NEURONTIN) 600 MG tablet Take 600 mg by mouth daily.       . hydrocodone-acetaminophen (LORCET-HD) 5-500 MG per capsule Take 1 capsule by mouth every 6 (six) hours as needed.        . insulin glargine (LANTUS) 100 UNIT/ML injection Inject 55 Units into the skin at bedtime.       Marland Kitchen losartan (COZAAR) 100 MG tablet Take 100 mg by mouth daily.        . metoprolol (LOPRESSOR) 100 MG tablet Take 100 mg by mouth. One and half tablets daily       . Multiple Vitamin (MULTIVITAMIN) tablet Take 1 tablet by mouth daily.        . niacin (NIASPAN) 500 MG CR tablet Take 500 mg by mouth 2 (two) times daily.      Marland Kitchen omeprazole (PRILOSEC) 20 MG capsule Take 20 mg by mouth daily.      . ondansetron (ZOFRAN) 4 MG tablet Take 4 mg by mouth every 8 (eight) hours as needed.        . ranitidine (ZANTAC) 150 MG tablet Take 150 mg by mouth 2 (two) times daily.        . rosuvastatin (CRESTOR) 40 MG tablet Take 20 mg by mouth daily.       Marland Kitchen zolpidem (AMBIEN) 10 MG tablet Take 10 mg by mouth at bedtime as needed.          Allergies  Allergen Reactions  . Amoxicillin     REACTION: unspecified  . Erythromycin   . Metformin  REACTION: Dizzy and nause  . Penicillins     Past Medical History  Diagnosis Date  . Migraine   . Osteoarthritis   . Vomiting     persistent  . Obstructive sleep apnea   . Asthma   . Ventral hernia     hx of  . Diabetes mellitus   . GERD (gastroesophageal reflux disease)   . Obesity 01-2010  . Hyperlipidemia   . Hypertension   . Coronary artery disease   . H/O: hysterectomy     Past Surgical History  Procedure Date  . Coronary artery bypass graft   . Tumor removal     ovarian  . Leg surgery     right  . Hernia repair   . Angioplasty     multiple times  . Cholecysterc   . Cholecystectomy   . Gastric stapling 1970    Family History  Problem Relation Age of Onset  . Heart disease Father   . Diabetes      family hx of paternal grandmother, aunt and grandfather- uncles    History   Social History  . Marital Status: Married    Spouse Name: N/A    Number of Children: N/A  . Years of Education: N/A   Occupational History  . Not on file.   Social History Main Topics  . Smoking status: Never Smoker   . Smokeless tobacco: Not on file  . Alcohol Use: No  . Drug Use: No  . Sexually Active: Not on file   Other Topics Concern  . Not on file   Social History Narrative  . No narrative on  file    ROS: Please see the HPI.  All other systems reviewed and negative.  PHYSICAL EXAM:  BP 220/150  Pulse 74  Ht 5' 2.5" (1.588 m)  Wt 116.629 kg (257 lb 1.9 oz)  BMI 46.28 kg/m2  General: Well developed, well nourished, in no acute distress. Head:  Normocephalic and atraumatic. Neck: no JVD Lungs: Clear to auscultation and percussion. Heart: Normal S1 and S2.  No murmur, rubs or gallops.  Abdomen:  Normal bowel sounds; soft; non tender; no organomegaly Pulses: Pulses normal in all 4 extremities. Extremities: No clubbing or cyanosis. No edema. Neurologic: Alert and oriented x 3.  EKG:  NSR.  LBBB.   ASSESSMENT AND PLAN:

## 2011-02-10 NOTE — Patient Instructions (Addendum)
Your physician has recommended you make the following change in your medication: INCREASE Metoprolol Tartrate to 100mg  take one by mouth twice a day  Your physician recommends that you schedule a follow-up appointment in: 2 WEEKS with Richardson Dopp PA-C and 6 WEEKS with Dr [name]

## 2011-02-23 NOTE — Assessment & Plan Note (Signed)
No recurrent chest pain, and stable for now from this standpoint.  Marland Kitchen

## 2011-02-23 NOTE — Assessment & Plan Note (Addendum)
She has suddenly gotten out of control for unclear reasons.  Has room to increase her beta blockers, and will need close follow up with Dr. Lorrene Reid.  May eventually be a candidate for renal denervation, and this would be a consideration, but not a good trial patient.

## 2011-02-23 NOTE — Assessment & Plan Note (Signed)
Being managed by Dr. Sandi Mariscal.

## 2011-02-24 ENCOUNTER — Encounter: Payer: Self-pay | Admitting: Physician Assistant

## 2011-02-24 ENCOUNTER — Ambulatory Visit (INDEPENDENT_AMBULATORY_CARE_PROVIDER_SITE_OTHER): Payer: 59 | Admitting: Physician Assistant

## 2011-02-24 VITALS — BP 182/90 | HR 51 | Resp 18 | Ht 64.0 in | Wt 255.0 lb

## 2011-02-24 DIAGNOSIS — I10 Essential (primary) hypertension: Secondary | ICD-10-CM

## 2011-02-24 DIAGNOSIS — I2510 Atherosclerotic heart disease of native coronary artery without angina pectoris: Secondary | ICD-10-CM

## 2011-02-24 DIAGNOSIS — R0600 Dyspnea, unspecified: Secondary | ICD-10-CM | POA: Insufficient documentation

## 2011-02-24 DIAGNOSIS — R0602 Shortness of breath: Secondary | ICD-10-CM

## 2011-02-24 LAB — BASIC METABOLIC PANEL
BUN: 22 mg/dL (ref 6–23)
CO2: 27 mEq/L (ref 19–32)
Calcium: 9.3 mg/dL (ref 8.4–10.5)
Chloride: 105 mEq/L (ref 96–112)
Creatinine, Ser: 0.6 mg/dL (ref 0.4–1.2)
GFR: 101.37 mL/min (ref >60.00)
Glucose, Bld: 240 mg/dL — ABNORMAL HIGH (ref 70–99)
Potassium: 4.5 mEq/L (ref 3.5–5.1)
Sodium: 140 mEq/L (ref 135–145)

## 2011-02-24 LAB — BRAIN NATRIURETIC PEPTIDE: Pro B Natriuretic peptide (BNP): 80 pg/mL (ref 0.0–100.0)

## 2011-02-24 MED ORDER — HYDRALAZINE HCL 25 MG PO TABS: 25.0000 mg | ORAL_TABLET | Freq: Three times a day (TID) | ORAL | Status: DC

## 2011-02-24 NOTE — Assessment & Plan Note (Signed)
With her uncontrolled hypertension, she is certainly at risk for diastolic heart failure.  I will obtain a basic metabolic panel and BNP today.  Increase Lasix if necessary.  I discussed her case with Dr. [name].  At this point, we do not feel that she needs stress testing.  She has followup with Dr. [name] 2/18.  She will keep that appointment.

## 2011-02-24 NOTE — Progress Notes (Signed)
[address] [place], [place]  [address] Phone: [phone] Fax:  [phone]  Date:  02/24/2011   Name:  [name] [name]       DOB:  1945/03/01 [name]:  [id number]  PCP:  Dr. Sandi Mariscal Primary Cardiologist:  Dr.  Bing Quarry  Nephrologist:  Dr. Lorrene Reid   History of Present Illness: [name] [name] is a 66 y.o. female who presents for follow up on BP.    She has a history of CAD, status post prior PCI, status post CABG in 12/11, Difficult to control hypertension, hyperlipidemia, obesity, sleep apnea, asthma, diabetes, GERD.  She has been seen at the [facility] hypertension clinic in the past.  She is also followed by nephrology.  She has previously undergone drug-eluting stent to the RCA and drug-eluting stent to the circumflex.  Last LHC 10/11: Inferobasal akinesis, EF 50%, LAD 50%, distal LAD 50%, diagonal 80% and 70%, OM 40%, circumflex stent patent with less than 30%, superior branch of the obtuse marginal 70%, AV circumflex 50%, RCA occluded proximal to stent with left-to-right collaterals.  CABG 12/11: LIMA-LAD, SVG-diagonal, SVG-both branches of the OM, SVG-PDA.  Echo 6/10: Posterior akinesis, mild LVH, EF 55-60%, moderate MR, mild LAE.  Carotid ultrasound negative for ICA stenosis 12/11.  MRI 2004: No renal artery stenosis.  Last seen by Dr.  Bing Quarry 1/9.  Blood pressure was 220/150.  Metoprolol was increased to 100 mg twice a day.  She brings in a listing of her blood pressures from home today.  They have ranged between 160/98-205/110 since he was last seen.  She has chronic nausea and vomiting.  For the most part, she throws up all of her medications.  Today, she was able to keep everything down.  She does feel sluggish.  She also notes gradually increasing shortness of breath over the last 4-5 months.  She has been sleeping sitting up recently.  She thinks that this may be related to her asthma.  She also notes questionable PND. She has chronic edema.  She  denies syncope.  She does note some chest discomfort with exertion.  She denies symptoms reminiscent of her pre-bypass symptoms to  Past Medical History  Diagnosis Date  . Migraine   . Osteoarthritis   . Vomiting     persistent  . Obstructive sleep apnea   . Asthma   . Ventral hernia     hx of  . Diabetes mellitus   . GERD (gastroesophageal reflux disease)   . Obesity 01-2010  . Hyperlipidemia   . Hypertension   . Coronary artery disease   . H/O: hysterectomy     Current Outpatient Prescriptions  Medication Sig Dispense Refill  . albuterol (PROVENTIL) (2.5 MG/3ML) 0.083% nebulizer solution Take 2.5 mg by nebulization as needed.        Marland Kitchen albuterol (PROVENTIL,VENTOLIN) 90 MCG/ACT inhaler Inhale 2 puffs into the lungs every 6 (six) hours as needed.        . ALPRAZolam (XANAX) 0.5 MG tablet Take 0.5 mg by mouth 3 (three) times daily.       Marland Kitchen APAP-Isometheptene-Dichloral 325-65-100 MG per capsule Take 1 capsule by mouth 4 (four) times daily as needed.      Marland Kitchen aspirin 81 MG tablet Take 81 mg by mouth daily.      . B Complex-C (B-COMPLEX WITH VITAMIN C) tablet Take 1 tablet by mouth daily.        . Calcium 1500 MG tablet Take 1,500 mg by  mouth.        . Cholecalciferol (VITAMIN D3) 1000 UNITS CAPS Take 1 capsule by mouth daily.        . Ferrous Sulfate (IRON) 325 (65 FE) MG TABS Take 1 tablet by mouth daily.        Marland Kitchen FLUoxetine (PROZAC) 40 MG capsule Take 40 mg by mouth daily.      . fluticasone (FLOVENT HFA) 110 MCG/ACT inhaler Inhale 2 puffs into the lungs as needed.        . Fluticasone-Salmeterol (ADVAIR DISKUS) 250-50 MCG/DOSE AEPB Inhale 1 puff into the lungs every 12 (twelve) hours.        . furosemide (LASIX) 40 MG tablet Take 40 mg by mouth 2 (two) times daily.       Marland Kitchen gabapentin (NEURONTIN) 600 MG tablet Take 600 mg by mouth daily.       . hydrocodone-acetaminophen (LORCET-HD) 5-500 MG per capsule Take 1 capsule by mouth every 6 (six) hours as needed.        . insulin glargine  (LANTUS) 100 UNIT/ML injection Inject 55 Units into the skin at bedtime.       Marland Kitchen losartan (COZAAR) 100 MG tablet Take 100 mg by mouth daily.        . metoprolol (LOPRESSOR) 100 MG tablet Take 1 tablet (100 mg total) by mouth 2 (two) times daily.  60 tablet  6  . Multiple Vitamin (MULTIVITAMIN) tablet Take 1 tablet by mouth daily.        . niacin (NIASPAN) 500 MG CR tablet Take 500 mg by mouth 2 (two) times daily.      Marland Kitchen omeprazole (PRILOSEC) 20 MG capsule Take 20 mg by mouth daily.      . ondansetron (ZOFRAN) 4 MG tablet Take 4 mg by mouth every 8 (eight) hours as needed.        . ranitidine (ZANTAC) 150 MG tablet Take 150 mg by mouth 2 (two) times daily.        . rosuvastatin (CRESTOR) 40 MG tablet Take 20 mg by mouth daily.       Marland Kitchen zolpidem (AMBIEN) 10 MG tablet Take 10 mg by mouth at bedtime as needed.          Allergies: Allergies  Allergen Reactions  . Amoxicillin     REACTION: unspecified  . Erythromycin   . Metformin     REACTION: Dizzy and nause  . Penicillins     History  Substance Use Topics  . Smoking status: Never Smoker   . Smokeless tobacco: Not on file  . Alcohol Use: No     ROS:  Please see the history of present illness.   She has seen Dr. Daylene Katayama for cervical DDD and trigger fingers.  She may need surgery on her neck.  All other systems reviewed and negative.   PHYSICAL EXAM: VS:  BP 182/90  Pulse 51  Resp 18  Ht 5\' 4"  (1.626 m)  Wt 255 lb (115.667 kg)  BMI 43.77 kg/m2 (BP obtained by me)  Well nourished, well developed, in no acute distress HEENT: normal Neck: no JVD Cardiac:  normal S1, S2; RRR; no murmur Lungs:  clear to auscultation bilaterally, no wheezing, rhonchi or rales Abd: soft, nontender, no hepatomegaly Ext: trace bilat edema Skin: warm and dry Neuro:  CNs 2-12 intact, no focal abnormalities noted  EKG:   Sinus bradycardia, heart rate 51, left bundle branch block  ASSESSMENT AND PLAN:

## 2011-02-24 NOTE — Patient Instructions (Signed)
Your physician has recommended you make the following change in your medication: START HYDRALZINE 25 MG 1 TABLET 3 TIMES DAILY  Your physician recommends that you return for lab work in: [place], 786.05, 401.1, 414.01  Your physician recommends that you schedule a follow-up appointment in: 03/22/11 WITH DR. [name]

## 2011-02-24 NOTE — Assessment & Plan Note (Signed)
Better controlled.  I discussed her case with Dr. [name].  We considered changing her metoprolol to carvedilol versus adding hydralazine.  After careful consideration, we decided to proceed with hydralazine 25 mg 3 times a day.  She is questioning whether or not she can undergo potential surgery on her neck.  I have recommended that she follow up with Dr. [name] 2/18.  At that point, we can reassess her dyspnea and blood pressure control and make further recommendations.

## 2011-02-24 NOTE — Assessment & Plan Note (Signed)
Continue aspirin and statin.  Followup as indicated above.

## 2011-03-08 ENCOUNTER — Other Ambulatory Visit: Payer: Self-pay | Admitting: Family Medicine

## 2011-03-08 DIAGNOSIS — R4701 Aphasia: Secondary | ICD-10-CM

## 2011-03-09 ENCOUNTER — Ambulatory Visit
Admission: RE | Admit: 2011-03-09 | Discharge: 2011-03-09 | Disposition: A | Discharge status: Home / Self Care | Payer: 59 | Source: Ambulatory Visit | Attending: Family Medicine | Admitting: Family Medicine

## 2011-03-09 DIAGNOSIS — R4701 Aphasia: Secondary | ICD-10-CM

## 2011-03-09 MED ORDER — GADOBENATE DIMEGLUMINE 529 MG/ML IV SOLN
20.0000 mL | Freq: Once | INTRAVENOUS | Status: AC | PRN
  Administered 2011-03-09: 20 mL via INTRAVENOUS

## 2011-03-10 ENCOUNTER — Other Ambulatory Visit: Payer: Self-pay | Admitting: Family Medicine

## 2011-03-10 DIAGNOSIS — M5412 Radiculopathy, cervical region: Secondary | ICD-10-CM

## 2011-03-12 ENCOUNTER — Other Ambulatory Visit: Payer: Self-pay | Admitting: Cardiology

## 2011-03-12 DIAGNOSIS — R4701 Aphasia: Secondary | ICD-10-CM

## 2011-03-15 ENCOUNTER — Encounter (INDEPENDENT_AMBULATORY_CARE_PROVIDER_SITE_OTHER): Payer: 59 | Admitting: Cardiology

## 2011-03-15 DIAGNOSIS — I6529 Occlusion and stenosis of unspecified carotid artery: Secondary | ICD-10-CM

## 2011-03-15 DIAGNOSIS — R4701 Aphasia: Secondary | ICD-10-CM

## 2011-03-16 ENCOUNTER — Encounter: Payer: 59 | Admitting: Cardiology

## 2011-03-17 ENCOUNTER — Ambulatory Visit
Admission: RE | Admit: 2011-03-17 | Discharge: 2011-03-17 | Disposition: A | Discharge status: Home / Self Care | Payer: 59 | Source: Ambulatory Visit | Attending: Family Medicine | Admitting: Family Medicine

## 2011-03-17 DIAGNOSIS — M5412 Radiculopathy, cervical region: Secondary | ICD-10-CM

## 2011-03-17 MED ORDER — DIAZEPAM 5 MG PO TABS
10.0000 mg | ORAL_TABLET | Freq: Once | ORAL | Status: AC
  Administered 2011-03-17: 10 mg via ORAL

## 2011-03-17 NOTE — Discharge Instructions (Signed)

## 2011-03-18 ENCOUNTER — Other Ambulatory Visit: Payer: Self-pay | Admitting: Orthopedic Surgery

## 2011-03-22 ENCOUNTER — Ambulatory Visit (INDEPENDENT_AMBULATORY_CARE_PROVIDER_SITE_OTHER): Payer: 59 | Admitting: Cardiology

## 2011-03-22 ENCOUNTER — Encounter: Payer: Self-pay | Admitting: Neurology

## 2011-03-22 ENCOUNTER — Encounter: Payer: Self-pay | Admitting: Cardiology

## 2011-03-22 DIAGNOSIS — G4733 Obstructive sleep apnea (adult) (pediatric): Secondary | ICD-10-CM

## 2011-03-22 DIAGNOSIS — R479 Unspecified speech disturbances: Secondary | ICD-10-CM | POA: Insufficient documentation

## 2011-03-22 DIAGNOSIS — I6529 Occlusion and stenosis of unspecified carotid artery: Secondary | ICD-10-CM

## 2011-03-22 DIAGNOSIS — I10 Essential (primary) hypertension: Secondary | ICD-10-CM

## 2011-03-22 DIAGNOSIS — E785 Hyperlipidemia, unspecified: Secondary | ICD-10-CM

## 2011-03-22 DIAGNOSIS — R4789 Other speech disturbances: Secondary | ICD-10-CM

## 2011-03-22 DIAGNOSIS — I2510 Atherosclerotic heart disease of native coronary artery without angina pectoris: Secondary | ICD-10-CM

## 2011-03-22 DIAGNOSIS — R0602 Shortness of breath: Secondary | ICD-10-CM

## 2011-03-22 MED ORDER — HYDRALAZINE HCL 50 MG PO TABS: 50.0000 mg | ORAL_TABLET | Freq: Three times a day (TID) | ORAL | Status: DC

## 2011-03-22 NOTE — Assessment & Plan Note (Signed)
Has some left carotid abnormality, and some speech disturbance at times.  Weather this is symptomatic carotid or intracranial disease related is unknown.  She needs referral to neuro at present, as well as referral to [facility].  I would put off elective surgery.  Also, treat BP.

## 2011-03-22 NOTE — Assessment & Plan Note (Signed)
Better but not well controlled at present.  Will increase hydralazine to 50mg  three time daily.

## 2011-03-22 NOTE — Progress Notes (Signed)
HPI:  Patient seen today in follow up.  Her BP is slightly better, but still up.  She complains of some episodes of having trouble getting her words.  Her carotid showed some increase, with 60-79% on the left.  She also had an MRI.  This showed some stenosis, but hard to determine what that means.  See report.  Her brain MRI was normal.  Her BP tends to run in the range of 180 sys.  She is supposed to have her trigger finger fixed this week.  Denies current chest pain.   Current Outpatient Prescriptions  Medication Sig Dispense Refill  . albuterol (PROVENTIL) (2.5 MG/3ML) 0.083% nebulizer solution Take 2.5 mg by nebulization as needed.        Marland Kitchen albuterol (PROVENTIL,VENTOLIN) 90 MCG/ACT inhaler Inhale 2 puffs into the lungs every 6 (six) hours as needed.        . ALPRAZolam (XANAX) 0.5 MG tablet Take 0.5 mg by mouth 3 (three) times daily.       Marland Kitchen APAP-Isometheptene-Dichloral 325-65-100 MG per capsule Take 1 capsule by mouth 4 (four) times daily as needed.      Marland Kitchen aspirin 81 MG tablet Take 81 mg by mouth daily.      . B Complex-C (B-COMPLEX WITH VITAMIN C) tablet Take 1 tablet by mouth daily.        . Calcium 1500 MG tablet Take 1,500 mg by mouth.        . Cholecalciferol (VITAMIN D3) 1000 UNITS CAPS Take 1 capsule by mouth daily.        . Ferrous Sulfate (IRON) 325 (65 FE) MG TABS Take 1 tablet by mouth daily.        Marland Kitchen FLUoxetine (PROZAC) 40 MG capsule Take 40 mg by mouth daily.      . fluticasone (FLOVENT HFA) 110 MCG/ACT inhaler Inhale 2 puffs into the lungs as needed.        . Fluticasone-Salmeterol (ADVAIR DISKUS) 250-50 MCG/DOSE AEPB Inhale 1 puff into the lungs every 12 (twelve) hours.        . furosemide (LASIX) 40 MG tablet Take 40 mg by mouth 2 (two) times daily.       Marland Kitchen gabapentin (NEURONTIN) 600 MG tablet Take 600 mg by mouth daily.       . hydrALAZINE (APRESOLINE) 25 MG tablet Take 1 tablet (25 mg total) by mouth 3 (three) times daily.  90 tablet  11  . hydrocodone-acetaminophen  (LORCET-HD) 5-500 MG per capsule Take 1 capsule by mouth every 6 (six) hours as needed.        . insulin glargine (LANTUS) 100 UNIT/ML injection Inject 65 Units into the skin at bedtime.       Marland Kitchen losartan (COZAAR) 100 MG tablet Take 100 mg by mouth daily.        . metoprolol (LOPRESSOR) 100 MG tablet Take 1 tablet (100 mg total) by mouth 2 (two) times daily.  60 tablet  6  . Multiple Vitamin (MULTIVITAMIN) tablet Take 1 tablet by mouth daily.        . niacin (NIASPAN) 500 MG CR tablet Take 500 mg by mouth 2 (two) times daily.      Marland Kitchen omeprazole (PRILOSEC) 20 MG capsule Take 20 mg by mouth daily.      . ondansetron (ZOFRAN) 4 MG tablet Take 4 mg by mouth every 8 (eight) hours as needed.        . ranitidine (ZANTAC) 150 MG tablet Take 150 mg by  mouth 2 (two) times daily.        . rosuvastatin (CRESTOR) 40 MG tablet Take 20 mg by mouth daily.       Marland Kitchen zolpidem (AMBIEN) 10 MG tablet Take 10 mg by mouth at bedtime as needed.          Allergies  Allergen Reactions  . Amoxicillin Shortness Of Breath and Rash    REACTION: unspecified  . Erythromycin Other (See Comments)    Trouble swallowing  . Penicillins Shortness Of Breath and Rash  . Metformin Nausea Only and Other (See Comments)     dizzy    Past Medical History  Diagnosis Date  . Migraine   . Osteoarthritis   . Vomiting     persistent  . Obstructive sleep apnea   . Asthma   . Ventral hernia     hx of  . Diabetes mellitus   . GERD (gastroesophageal reflux disease)   . Obesity 01-2010  . Hyperlipidemia   . Hypertension   . Coronary artery disease   . H/O: hysterectomy     Past Surgical History  Procedure Date  . Coronary artery bypass graft   . Tumor removal     ovarian  . Leg surgery     right  . Hernia repair   . Angioplasty     multiple times  . Cholecysterc   . Cholecystectomy   . Gastric stapling 1970    Family History  Problem Relation Age of Onset  . Heart disease Father   . Diabetes      family hx of  paternal grandmother, aunt and grandfather- uncles    History   Social History  . Marital Status: Married    Spouse Name: N/A    Number of Children: N/A  . Years of Education: N/A   Occupational History  . Not on file.   Social History Main Topics  . Smoking status: Never Smoker   . Smokeless tobacco: Not on file  . Alcohol Use: No  . Drug Use: No  . Sexually Active: Not on file   Other Topics Concern  . Not on file   Social History Narrative  . No narrative on file    ROS: Please see the HPI.  All other systems reviewed and negative.  PHYSICAL EXAM:  BP 186/98  Pulse 55  Ht 5\' 2"  (1.575 m)  Wt 257 lb (116.574 kg)  BMI 47.01 kg/m2  General: Well developed, well nourished, in no acute distress. Head:  Normocephalic and atraumatic. Neck: no JVD.  I cannot appreciate much of a bruit.   Lungs: Clear to auscultation and percussion. Heart: Normal S1 and S2.  No murmur, rubs or gallops.  Abdomen:  Normal bowel sounds; soft; non tender; no organomegaly Pulses: Pulses normal in all 4 extremities. Extremities: No clubbing or cyanosis. No edema. Neurologic: Alert and oriented x 3.  No focal defects.    EKG:    ASSESSMENT AND PLAN:

## 2011-03-22 NOTE — Assessment & Plan Note (Signed)
Not currently treated.  Cannot use CPAP.

## 2011-03-22 NOTE — Patient Instructions (Signed)
Your physician recommends that you schedule a follow-up appointment in: 1 MONTH with Dr Sheppard Penton have been referred to [facility] for TIA symptoms and carotid disease  Your physician has recommended you make the following change in your medication: INCREASE Hydralazine to 50mg  take one by mouth three times a day

## 2011-03-26 ENCOUNTER — Ambulatory Visit (HOSPITAL_BASED_OUTPATIENT_CLINIC_OR_DEPARTMENT_OTHER): Admission: RE | Admit: 2011-03-26 | Payer: 59 | Source: Ambulatory Visit | Admitting: Orthopedic Surgery

## 2011-03-26 ENCOUNTER — Encounter (HOSPITAL_BASED_OUTPATIENT_CLINIC_OR_DEPARTMENT_OTHER): Admission: RE | Payer: Self-pay | Source: Ambulatory Visit

## 2011-03-26 SURGERY — MINOR RELEASE TRIGGER FINGER/A-1 PULLEY
Anesthesia: LOCAL | Laterality: Left

## 2011-03-26 NOTE — Assessment & Plan Note (Signed)
Currently on rosuvastatin in combination with niacin.  Not sure that niacin is really of benefit.  Will continue to discuss over time.

## 2011-03-30 ENCOUNTER — Other Ambulatory Visit: Payer: Self-pay | Admitting: Family Medicine

## 2011-03-30 ENCOUNTER — Encounter: Payer: Self-pay | Admitting: Vascular Surgery

## 2011-03-30 DIAGNOSIS — M5412 Radiculopathy, cervical region: Secondary | ICD-10-CM

## 2011-03-31 ENCOUNTER — Encounter: Payer: Self-pay | Admitting: Vascular Surgery

## 2011-03-31 ENCOUNTER — Other Ambulatory Visit: Payer: Self-pay | Admitting: *Deleted

## 2011-03-31 ENCOUNTER — Other Ambulatory Visit (INDEPENDENT_AMBULATORY_CARE_PROVIDER_SITE_OTHER): Payer: 59 | Admitting: *Deleted

## 2011-03-31 ENCOUNTER — Ambulatory Visit (INDEPENDENT_AMBULATORY_CARE_PROVIDER_SITE_OTHER): Payer: 59 | Admitting: Vascular Surgery

## 2011-03-31 ENCOUNTER — Encounter: Payer: Self-pay | Admitting: *Deleted

## 2011-03-31 VITALS — BP 147/43 | HR 51 | Resp 16 | Ht 62.0 in | Wt 256.0 lb

## 2011-03-31 DIAGNOSIS — I6529 Occlusion and stenosis of unspecified carotid artery: Secondary | ICD-10-CM

## 2011-03-31 NOTE — Progress Notes (Signed)
[facility] [place]  Patient name: [name] [name]: [id number] DOB: 08-28-1945 Sex: female  REASON FOR CONSULT: Left carotid stenosis. Referred by Dr. Addison Lank.  HPI: [name] [name] is a 66 y.o. female who was referred with a left carotid stenosis. Of note she is right-handed. She states that she's had multiple episodes in the past of expressive aphasia. These symptoms last from anywhere from 2 minutes to 30 minutes and resolved spontaneously. She had gone for approximately 2 years without any symptoms but recently began having symptoms again. There does not appear to be any pattern to how often may occur. She states that her last episode was one week ago. She's had no prior history of stroke, TIAs, or amaurosis fugax.  Her workup for this included a carotid duplex scan which suggested a 60-79% left carotid stenosis. She was sent for vascular consultation. In addition she has had problems with neck pain and has some cervical disc disease. She's scheduled to be seen by Dr. Vertell Limber later in March. She also has an appointment with [facility].   Past Medical History  Diagnosis Date  . Migraine   . Osteoarthritis   . Vomiting     persistent  . Obstructive sleep apnea   . Asthma   . Ventral hernia     hx of  . Diabetes mellitus   . GERD (gastroesophageal reflux disease)   . Obesity 01-2010  . Hyperlipidemia   . Hypertension   . Coronary artery disease   . H/O: hysterectomy   . Anemia   . Irregular heart beat   . Peripheral vascular disease   . Myocardial infarction 2011    most recent 2011    Family History  Problem Relation Age of Onset  . Heart disease Father     Heart Disease before age 32  . Diabetes Father   . Hyperlipidemia Father   . Hypertension Father   . Heart attack Father   . Diabetes      family hx of paternal grandmother, aunt and grandfather- uncles  . Hypertension Mother   . Cancer Sister     SOCIAL  HISTORY: History  Substance Use Topics  . Smoking status: Never Smoker   . Smokeless tobacco: Not on file  . Alcohol Use: No    Allergies  Allergen Reactions  . Amoxicillin Shortness Of Breath and Rash    REACTION: unspecified  . Erythromycin Other (See Comments)    Trouble swallowing  . Penicillins Shortness Of Breath and Rash  . Metformin Nausea Only and Other (See Comments)     dizzy    Current Outpatient Prescriptions  Medication Sig Dispense Refill  . albuterol (PROVENTIL) (2.5 MG/3ML) 0.083% nebulizer solution Take 2.5 mg by nebulization as needed.        Marland Kitchen albuterol (PROVENTIL,VENTOLIN) 90 MCG/ACT inhaler Inhale 2 puffs into the lungs every 6 (six) hours as needed.        . ALPRAZolam (XANAX) 0.5 MG tablet Take 0.5 mg by mouth 3 (three) times daily.       Marland Kitchen APAP-Isometheptene-Dichloral 325-65-100 MG per capsule Take 1 capsule by mouth 4 (four) times daily as needed.      Marland Kitchen aspirin 81 MG tablet Take 81 mg by mouth daily.      . B Complex-C (B-COMPLEX WITH VITAMIN C) tablet Take 1 tablet by mouth daily.        . Calcium 1500 MG tablet Take 1,500 mg by mouth.        Marland Kitchen  Cholecalciferol (VITAMIN D3) 1000 UNITS CAPS Take 1 capsule by mouth daily.        . Ferrous Sulfate (IRON) 325 (65 FE) MG TABS Take 1 tablet by mouth daily.        Marland Kitchen FLUoxetine (PROZAC) 40 MG capsule Take 40 mg by mouth daily.      . fluticasone (FLOVENT HFA) 110 MCG/ACT inhaler Inhale 2 puffs into the lungs as needed.        . Fluticasone-Salmeterol (ADVAIR DISKUS) 250-50 MCG/DOSE AEPB Inhale 1 puff into the lungs every 12 (twelve) hours.        . furosemide (LASIX) 40 MG tablet Take 40 mg by mouth 2 (two) times daily.       Marland Kitchen gabapentin (NEURONTIN) 600 MG tablet Take 600 mg by mouth daily.       . hydrALAZINE (APRESOLINE) 50 MG tablet Take 1 tablet (50 mg total) by mouth 3 (three) times daily.  90 tablet  11  . hydrocodone-acetaminophen (LORCET-HD) 5-500 MG per capsule Take 1 capsule by mouth every 6 (six)  hours as needed.        . insulin glargine (LANTUS) 100 UNIT/ML injection Inject 65 Units into the skin at bedtime.       Marland Kitchen losartan (COZAAR) 100 MG tablet Take 100 mg by mouth daily.        . metoprolol (LOPRESSOR) 100 MG tablet Take 1 tablet (100 mg total) by mouth 2 (two) times daily.  60 tablet  6  . Multiple Vitamin (MULTIVITAMIN) tablet Take 1 tablet by mouth daily.        . niacin (NIASPAN) 500 MG CR tablet Take 500 mg by mouth 2 (two) times daily.      Marland Kitchen omeprazole (PRILOSEC) 20 MG capsule Take 20 mg by mouth daily.      . ondansetron (ZOFRAN) 4 MG tablet Take 4 mg by mouth every 8 (eight) hours as needed.        . ranitidine (ZANTAC) 150 MG tablet Take 150 mg by mouth 2 (two) times daily.        . rosuvastatin (CRESTOR) 40 MG tablet Take 20 mg by mouth daily.       Marland Kitchen zolpidem (AMBIEN) 10 MG tablet Take 10 mg by mouth at bedtime as needed.          REVIEW OF SYSTEMS: Valu.Nieves ] denotes positive finding; [  ] denotes negative finding  CARDIOVASCULAR:  Valu.Nieves ] chest pain- most recent episode one week ago. She states that these symptoms have been stable.   [ ]  chest pressure   [ ]  palpitations   [ ]  orthopnea   Valu.Nieves ] dyspnea on exertion   [ ]  claudication   [ ]  rest pain   [ ]  DVT   [ ]  phlebitis PULMONARY:   [ ]  productive cough   Valu.Nieves ] asthma   [ ]  wheezing NEUROLOGIC:   [ ]  weakness  [ ]  paresthesias  Valu.Nieves ] aphasia  [ ]  amaurosis  Valu.Nieves ] dizziness HEMATOLOGIC:   [ ]  bleeding problems   [ ]  clotting disorders MUSCULOSKELETAL:  [ ]  joint pain   [ ]  joint swelling Valu.Nieves ] leg swelling GASTROINTESTINAL: [ ]   blood in stool  [ ]   hematemesis GENITOURINARY:  [ ]   dysuria  [ ]   hematuria PSYCHIATRIC:  [ ]  history of major depression INTEGUMENTARY:  [ ]  rashes  [ ]  ulcers CONSTITUTIONAL:  [ ]  fever   [ ]  chills  PHYSICAL  EXAMDanley Danker Vitals:   03/31/11 1325 03/31/11 1327  BP: 138/55 147/43  Pulse: 54 51  Resp: 16   Height: 5\' 2"  (1.575 m)   Weight: 256 lb (116.121 kg)   SpO2: 100% 98%   Body mass  index is 46.82 kg/(m^2). GENERAL: The patient is a well-nourished female, in no acute distress. The vital signs are documented above. CARDIOVASCULAR: There is a regular rate and rhythm without significant murmur appreciated. I do not detect carotid bruits. Is difficult to palpate femorals pulses because of her size. She does have palpable dorsalis pedis pulses bilaterally. She has mild bilateral lower extremity swelling. PULMONARY: There is good air exchange bilaterally without wheezing or rales. ABDOMEN: Soft and non-tender with normal pitched bowel sounds. Does of her obesity, I am unable to assess for an aneurysm. MUSCULOSKELETAL: There are no major deformities or cyanosis. NEUROLOGIC: No focal weakness or paresthesias are detected. SKIN: There are no ulcers or rashes noted. PSYCHIATRIC: The patient has a normal affect.  DATA:  Lab Results  Component Value Date   WBC 12.7* 02/01/2010   HGB 11.1* 02/01/2010   HCT 33.3* 02/01/2010   MCV 94.3 02/01/2010   PLT 198 02/01/2010   Lab Results  Component Value Date   CREATININE 0.6 02/24/2011   MRI/MRA BRAIN: This shows moderate to severe disease of the proximal left A1 and left P1 segments.  She has also had x-rays of her neck she tells me although I cannot find disease on the computer system. She says she has significant degenerative disc disease of her cervical spine and is scheduled to see Dr. Vertell Limber concerning this.  I have also reviewed her carotid duplex scan from the [facility] office. This shows no significant carotid stenosis on the right. On the left side she is noted to have a 60-79% carotid stenosis. Based on the velocities on the left however, the stenosis is clearly at the lower end of that range.  MEDICAL ISSUES:   POSSIBLE SYMPTOMATIC LEFT CAROTID STENOSIS: This patient has had repeated episodes of expressive aphasia. She has a moderate left carotid stenosis by duplex scan. However, she also has intracranial disease noted on her MRA on  the left side. I could not be sure that the extracranial carotid disease is responsible for her symptoms. I have recommended that we proceed with cerebral arteriography to further delineate her extracranial and intracranial disease. If she were found to have a smooth 60% left carotid stenosis, then I do not think I could necessarily help her with left carotid endarterectomy, especially if she has significant intracranial disease. On the other hand if the stenosis is significant on the left then I think she would benefit from left carotid endarterectomy. She is scheduled to see for [facility] on March 8 and I would also certainly welcome their input on her scans.   I have discussed the indications for cerebral arteriography. I've also discussed the potential complications of the procedure, including but not limited to: Bleeding, arterial injury, stroke (1%. Procedural risk), renal insufficiency, or other unpredictable medical problems. Because of her morbid obesity, she is at increased risk for a cath site complication. All the patient's questions were answered and they are agreeable to proceed. Her cerebral arteriogram is scheduled for 04/05/2011. I will make further recommendations pending these results. She does know to continue taking her aspirin.   [facility] [place] Beeper: [phone]

## 2011-04-01 ENCOUNTER — Encounter (HOSPITAL_COMMUNITY): Payer: Self-pay | Admitting: Pharmacy Technician

## 2011-04-02 ENCOUNTER — Ambulatory Visit
Admission: RE | Admit: 2011-04-02 | Discharge: 2011-04-02 | Disposition: A | Discharge status: Home / Self Care | Payer: 59 | Source: Ambulatory Visit | Attending: Family Medicine | Admitting: Family Medicine

## 2011-04-02 DIAGNOSIS — M5412 Radiculopathy, cervical region: Secondary | ICD-10-CM

## 2011-04-02 MED ORDER — DIAZEPAM 5 MG PO TABS
10.0000 mg | ORAL_TABLET | Freq: Once | ORAL | Status: AC
  Administered 2011-04-02: 10 mg via ORAL

## 2011-04-05 ENCOUNTER — Ambulatory Visit (HOSPITAL_COMMUNITY)
Admission: RE | Admit: 2011-04-05 | Discharge: 2011-04-05 | Disposition: A | Discharge status: Home / Self Care | Payer: 59 | Source: Ambulatory Visit | Attending: Vascular Surgery | Admitting: Vascular Surgery

## 2011-04-05 ENCOUNTER — Encounter (HOSPITAL_COMMUNITY)
Admission: RE | Disposition: A | Discharge status: Home / Self Care | Payer: Self-pay | Source: Ambulatory Visit | Attending: Vascular Surgery

## 2011-04-05 DIAGNOSIS — G43909 Migraine, unspecified, not intractable, without status migrainosus: Secondary | ICD-10-CM | POA: Insufficient documentation

## 2011-04-05 DIAGNOSIS — E669 Obesity, unspecified: Secondary | ICD-10-CM | POA: Insufficient documentation

## 2011-04-05 DIAGNOSIS — G4733 Obstructive sleep apnea (adult) (pediatric): Secondary | ICD-10-CM | POA: Insufficient documentation

## 2011-04-05 DIAGNOSIS — I2510 Atherosclerotic heart disease of native coronary artery without angina pectoris: Secondary | ICD-10-CM | POA: Insufficient documentation

## 2011-04-05 DIAGNOSIS — I658 Occlusion and stenosis of other precerebral arteries: Secondary | ICD-10-CM | POA: Insufficient documentation

## 2011-04-05 DIAGNOSIS — I6529 Occlusion and stenosis of unspecified carotid artery: Secondary | ICD-10-CM | POA: Insufficient documentation

## 2011-04-05 DIAGNOSIS — I252 Old myocardial infarction: Secondary | ICD-10-CM | POA: Insufficient documentation

## 2011-04-05 DIAGNOSIS — E785 Hyperlipidemia, unspecified: Secondary | ICD-10-CM | POA: Insufficient documentation

## 2011-04-05 DIAGNOSIS — R4701 Aphasia: Secondary | ICD-10-CM | POA: Insufficient documentation

## 2011-04-05 DIAGNOSIS — K219 Gastro-esophageal reflux disease without esophagitis: Secondary | ICD-10-CM | POA: Insufficient documentation

## 2011-04-05 DIAGNOSIS — I10 Essential (primary) hypertension: Secondary | ICD-10-CM | POA: Insufficient documentation

## 2011-04-05 DIAGNOSIS — E119 Type 2 diabetes mellitus without complications: Secondary | ICD-10-CM | POA: Insufficient documentation

## 2011-04-05 HISTORY — PX: CEREBRAL ANGIOGRAM: SHX5506

## 2011-04-05 LAB — GLUCOSE, CAPILLARY: Glucose-Capillary: 129 mg/dL — ABNORMAL HIGH (ref 70–99)

## 2011-04-05 SURGERY — CEREBRAL ANGIOGRAM
Anesthesia: LOCAL

## 2011-04-05 MED ORDER — SODIUM CHLORIDE 0.9 % IV SOLN: 1.0000 mL/kg/h | INTRAVENOUS | Status: DC

## 2011-04-05 MED ORDER — OXYCODONE-ACETAMINOPHEN 5-325 MG PO TABS
1.0000 | ORAL_TABLET | ORAL | Status: DC | PRN
  Administered 2011-04-05: 2 via ORAL

## 2011-04-05 MED ORDER — OXYCODONE-ACETAMINOPHEN 5-325 MG PO TABS
ORAL_TABLET | ORAL | Status: AC
  Filled 2011-04-05: qty 2

## 2011-04-05 MED ORDER — OXYCODONE-ACETAMINOPHEN 5-325 MG PO TABS
ORAL_TABLET | ORAL | Status: AC
  Administered 2011-04-05: 2
  Filled 2011-04-05: qty 2

## 2011-04-05 MED ORDER — SODIUM CHLORIDE 0.9 % IV SOLN
INTRAVENOUS | Status: DC
  Administered 2011-04-05: 09:00:00 via INTRAVENOUS

## 2011-04-05 MED ORDER — ONDANSETRON HCL 4 MG/2ML IJ SOLN: 4.0000 mg | Freq: Four times a day (QID) | INTRAMUSCULAR | Status: DC | PRN

## 2011-04-05 MED ORDER — LIDOCAINE HCL (PF) 1 % IJ SOLN
INTRAMUSCULAR | Status: AC
  Filled 2011-04-05: qty 30

## 2011-04-05 MED ORDER — HEPARIN SODIUM (PORCINE) 1000 UNIT/ML IJ SOLN
INTRAMUSCULAR | Status: AC
  Filled 2011-04-05: qty 1

## 2011-04-05 MED ORDER — ACETAMINOPHEN 325 MG PO TABS: 650.0000 mg | ORAL_TABLET | ORAL | Status: DC | PRN

## 2011-04-05 MED ORDER — HEPARIN (PORCINE) IN NACL 2-0.9 UNIT/ML-% IJ SOLN
INTRAMUSCULAR | Status: AC
  Filled 2011-04-05: qty 1000

## 2011-04-05 NOTE — H&P (View-Only) (Signed)
[facility] [place]  Patient name: [name] [name] [name]: [id number] DOB: September 26, 1945 Sex: female  REASON FOR CONSULT: Left carotid stenosis. Referred by Dr. Addison Lank.  HPI: [name] [name] is a 66 y.o. female who was referred with a left carotid stenosis. Of note she is right-handed. She states that she's had multiple episodes in the past of expressive aphasia. These symptoms last from anywhere from 2 minutes to 30 minutes and resolved spontaneously. She had gone for approximately 2 years without any symptoms but recently began having symptoms again. There does not appear to be any pattern to how often may occur. She states that her last episode was one week ago. She's had no prior history of stroke, TIAs, or amaurosis fugax.  Her workup for this included a carotid duplex scan which suggested a 60-79% left carotid stenosis. She was sent for vascular consultation. In addition she has had problems with neck pain and has some cervical disc disease. She's scheduled to be seen by Dr. Vertell Limber later in March. She also has an appointment with [facility].   Past Medical History  Diagnosis Date  . Migraine   . Osteoarthritis   . Vomiting     persistent  . Obstructive sleep apnea   . Asthma   . Ventral hernia     hx of  . Diabetes mellitus   . GERD (gastroesophageal reflux disease)   . Obesity 01-2010  . Hyperlipidemia   . Hypertension   . Coronary artery disease   . H/O: hysterectomy   . Anemia   . Irregular heart beat   . Peripheral vascular disease   . Myocardial infarction 2011    most recent 2011    Family History  Problem Relation Age of Onset  . Heart disease Father     Heart Disease before age 66  . Diabetes Father   . Hyperlipidemia Father   . Hypertension Father   . Heart attack Father   . Diabetes      family hx of paternal grandmother, aunt and grandfather- uncles  . Hypertension Mother   . Cancer Sister     SOCIAL  HISTORY: History  Substance Use Topics  . Smoking status: Never Smoker   . Smokeless tobacco: Not on file  . Alcohol Use: No    Allergies  Allergen Reactions  . Amoxicillin Shortness Of Breath and Rash    REACTION: unspecified  . Erythromycin Other (See Comments)    Trouble swallowing  . Penicillins Shortness Of Breath and Rash  . Metformin Nausea Only and Other (See Comments)     dizzy    Current Outpatient Prescriptions  Medication Sig Dispense Refill  . albuterol (PROVENTIL) (2.5 MG/3ML) 0.083% nebulizer solution Take 2.5 mg by nebulization as needed.        Marland Kitchen albuterol (PROVENTIL,VENTOLIN) 90 MCG/ACT inhaler Inhale 2 puffs into the lungs every 6 (six) hours as needed.        . ALPRAZolam (XANAX) 0.5 MG tablet Take 0.5 mg by mouth 3 (three) times daily.       Marland Kitchen APAP-Isometheptene-Dichloral 325-65-100 MG per capsule Take 1 capsule by mouth 4 (four) times daily as needed.      Marland Kitchen aspirin 81 MG tablet Take 81 mg by mouth daily.      . B Complex-C (B-COMPLEX WITH VITAMIN C) tablet Take 1 tablet by mouth daily.        . Calcium 1500 MG tablet Take 1,500 mg by mouth.        Marland Kitchen  Cholecalciferol (VITAMIN D3) 1000 UNITS CAPS Take 1 capsule by mouth daily.        . Ferrous Sulfate (IRON) 325 (65 FE) MG TABS Take 1 tablet by mouth daily.        Marland Kitchen FLUoxetine (PROZAC) 40 MG capsule Take 40 mg by mouth daily.      . fluticasone (FLOVENT HFA) 110 MCG/ACT inhaler Inhale 2 puffs into the lungs as needed.        . Fluticasone-Salmeterol (ADVAIR DISKUS) 250-50 MCG/DOSE AEPB Inhale 1 puff into the lungs every 12 (twelve) hours.        . furosemide (LASIX) 40 MG tablet Take 40 mg by mouth 2 (two) times daily.       Marland Kitchen gabapentin (NEURONTIN) 600 MG tablet Take 600 mg by mouth daily.       . hydrALAZINE (APRESOLINE) 50 MG tablet Take 1 tablet (50 mg total) by mouth 3 (three) times daily.  90 tablet  11  . hydrocodone-acetaminophen (LORCET-HD) 5-500 MG per capsule Take 1 capsule by mouth every 6 (six)  hours as needed.        . insulin glargine (LANTUS) 100 UNIT/ML injection Inject 65 Units into the skin at bedtime.       Marland Kitchen losartan (COZAAR) 100 MG tablet Take 100 mg by mouth daily.        . metoprolol (LOPRESSOR) 100 MG tablet Take 1 tablet (100 mg total) by mouth 2 (two) times daily.  60 tablet  6  . Multiple Vitamin (MULTIVITAMIN) tablet Take 1 tablet by mouth daily.        . niacin (NIASPAN) 500 MG CR tablet Take 500 mg by mouth 2 (two) times daily.      Marland Kitchen omeprazole (PRILOSEC) 20 MG capsule Take 20 mg by mouth daily.      . ondansetron (ZOFRAN) 4 MG tablet Take 4 mg by mouth every 8 (eight) hours as needed.        . ranitidine (ZANTAC) 150 MG tablet Take 150 mg by mouth 2 (two) times daily.        . rosuvastatin (CRESTOR) 40 MG tablet Take 20 mg by mouth daily.       Marland Kitchen zolpidem (AMBIEN) 10 MG tablet Take 10 mg by mouth at bedtime as needed.          REVIEW OF SYSTEMS: Valu.Nieves ] denotes positive finding; [  ] denotes negative finding  CARDIOVASCULAR:  Valu.Nieves ] chest pain- most recent episode one week ago. She states that these symptoms have been stable.   [ ]  chest pressure   [ ]  palpitations   [ ]  orthopnea   Valu.Nieves ] dyspnea on exertion   [ ]  claudication   [ ]  rest pain   [ ]  DVT   [ ]  phlebitis PULMONARY:   [ ]  productive cough   Valu.Nieves ] asthma   [ ]  wheezing NEUROLOGIC:   [ ]  weakness  [ ]  paresthesias  Valu.Nieves ] aphasia  [ ]  amaurosis  Valu.Nieves ] dizziness HEMATOLOGIC:   [ ]  bleeding problems   [ ]  clotting disorders MUSCULOSKELETAL:  [ ]  joint pain   [ ]  joint swelling Valu.Nieves ] leg swelling GASTROINTESTINAL: [ ]   blood in stool  [ ]   hematemesis GENITOURINARY:  [ ]   dysuria  [ ]   hematuria PSYCHIATRIC:  [ ]  history of major depression INTEGUMENTARY:  [ ]  rashes  [ ]  ulcers CONSTITUTIONAL:  [ ]  fever   [ ]  chills  PHYSICAL  EXAMDanley Danker Vitals:   03/31/11 1325 03/31/11 1327  BP: 138/55 147/43  Pulse: 54 51  Resp: 16   Height: 5\' 2"  (1.575 m)   Weight: 256 lb (116.121 kg)   SpO2: 100% 98%   Body mass  index is 46.82 kg/(m^2). GENERAL: The patient is a well-nourished female, in no acute distress. The vital signs are documented above. CARDIOVASCULAR: There is a regular rate and rhythm without significant murmur appreciated. I do not detect carotid bruits. Is difficult to palpate femorals pulses because of her size. She does have palpable dorsalis pedis pulses bilaterally. She has mild bilateral lower extremity swelling. PULMONARY: There is good air exchange bilaterally without wheezing or rales. ABDOMEN: Soft and non-tender with normal pitched bowel sounds. Does of her obesity, I am unable to assess for an aneurysm. MUSCULOSKELETAL: There are no major deformities or cyanosis. NEUROLOGIC: No focal weakness or paresthesias are detected. SKIN: There are no ulcers or rashes noted. PSYCHIATRIC: The patient has a normal affect.  DATA:  Lab Results  Component Value Date   WBC 12.7* 02/01/2010   HGB 11.1* 02/01/2010   HCT 33.3* 02/01/2010   MCV 94.3 02/01/2010   PLT 198 02/01/2010   Lab Results  Component Value Date   CREATININE 0.6 02/24/2011   MRI/MRA BRAIN: This shows moderate to severe disease of the proximal left A1 and left P1 segments.  She has also had x-rays of her neck she tells me although I cannot find disease on the computer system. She says she has significant degenerative disc disease of her cervical spine and is scheduled to see Dr. Vertell Limber concerning this.  I have also reviewed her carotid duplex scan from the [facility] office. This shows no significant carotid stenosis on the right. On the left side she is noted to have a 60-79% carotid stenosis. Based on the velocities on the left however, the stenosis is clearly at the lower end of that range.  MEDICAL ISSUES:   POSSIBLE SYMPTOMATIC LEFT CAROTID STENOSIS: This patient has had repeated episodes of expressive aphasia. She has a moderate left carotid stenosis by duplex scan. However, she also has intracranial disease noted on her MRA on  the left side. I could not be sure that the extracranial carotid disease is responsible for her symptoms. I have recommended that we proceed with cerebral arteriography to further delineate her extracranial and intracranial disease. If she were found to have a smooth 60% left carotid stenosis, then I do not think I could necessarily help her with left carotid endarterectomy, especially if she has significant intracranial disease. On the other hand if the stenosis is significant on the left then I think she would benefit from left carotid endarterectomy. She is scheduled to see for [facility] on March 8 and I would also certainly welcome their input on her scans.   I have discussed the indications for cerebral arteriography. I've also discussed the potential complications of the procedure, including but not limited to: Bleeding, arterial injury, stroke (1%. Procedural risk), renal insufficiency, or other unpredictable medical problems. Because of her morbid obesity, she is at increased risk for a cath site complication. All the patient's questions were answered and they are agreeable to proceed. Her cerebral arteriogram is scheduled for 04/05/2011. I will make further recommendations pending these results. She does know to continue taking her aspirin.   [place] [facility] [place] [facility]: [phone]

## 2011-04-05 NOTE — Interval H&P Note (Signed)
History and Physical Interval Note:  04/05/2011 10:10 AM  [name] [name]  has presented today for surgery, with the diagnosis of carotid stenosis  The various methods of treatment have been discussed with the patient and family. After consideration of risks, benefits and other options for treatment, the patient has consented to: CEREBRAL ANGIOGRAM.  The patients' history has been reviewed, patient examined, no change in status, stable for surgery.  I have reviewed the patients' chart and labs.  Questions were answered to the patient's satisfaction.     Nishanth Mccaughan S

## 2011-04-05 NOTE — Op Note (Signed)
PATIENT: [name] [name]   [name]: [id number] DOB: January 28, 1946    DATE OF PROCEDURE: 04/05/2011  INDICATIONS: TAMMI HARTVIGSEN is a 66 y.o. female who had been having episodes of expressive aphasia. MRA of the brain showed some moderate to severe intracranial disease on the left side. Duplex scan had suggested a 60-79% left carotid stenosis in the low end of that range. I recommended cerebral arteriography to further delineate the carotid disease on the left.  PROCEDURE:  1. Ultrasound-guided access to the right common femoral artery 2. Arch aortogram 3. Selective and non-it arteriogram 4. Selective right common carotid arteriogram 5. Selective left common carotid arteriogram  SURGEON: Judeth Cornfield. Scot Dock, MD, FACS  ANESTHESIA: local   EBL: minimal  TECHNIQUE: Patient was brought to the [facility] . The right groin was prepped and draped in usual sterile fashion. After the skin was anesthetized with 1% lidocaine, and under ultrasound guidance, the right common femoral artery was cannulated. Of note I had to stick lower than I would like because of her very large pannus. To stick any higher would've meant having an extremely deep wound and likely not being able to reach the artery with a needle. A guidewire was introduced into the infrarenal aorta 5 French sheath introduced over the wire. The pigtail catheter was positioned in the ascending aortic arch and arch aortogram obtained and a 30 LAO projection. The pigtail catheter was exchanged for an H1 catheter which was positioned into the innominate artery and a selective innominate injection was made with a 30 RAO projection. Next the wire was advanced into the common carotid artery and the catheter advanced over the wire. A selective right common carotid arteriograms obtained. Both intracranial and extracranial views were obtained. Next the catheter was retracted into the aortic arch I was unable to cannulate the common carotid artery on the left. I exchanged  the H1 catheter for a Berenstein catheter and was able to get a wire into the common carotid artery the wire was then extended in the Berenstein catheter exchanged for an endhole catheter. I was unable to get good return from the catheter therefore this catheter was removed and flushed. Ultimately I used a similar catheter to selectively cannulate the common carotid artery is selective common carotid arteriotomy was obtained on the left. Again both intracranial and extracranial views were obtained. At the completion of the procedure the sheath was removed and pressure held for hemostasis. No immediate complications were noted.  FINDINGS:  1. Intracranial interpretation will be performed by the neuroradiologist. 2. Arch is widely patent. The innominate artery right subclavian and right vertebral arteries are patent with no significant stenosis. The left subclavian and left vertebral artery likewise are patent with no significant stenosis. 3. On the right side, there is no significant stenosis of the right common or internal carotid artery. There is a 50% right external carotid artery stenosis. 4. On the left side, there is a 50% stenosis of the proximal internal carotid artery. This is ulcerated with a moderately deep ulcer noted on lateral projection. The plaque is significantly calcified.   Deitra Mayo, MD, FACS [facility]  DATE OF DICTATION:   04/05/2011

## 2011-04-05 NOTE — Discharge Instructions (Signed)
Groin Site Care Refer to this sheet in the next few weeks. These instructions provide you with information on caring for yourself after your procedure. Your caregiver may also give you more specific instructions. Your treatment has been planned according to current medical practices, but problems sometimes occur. Call your caregiver if you have any problems or questions after your procedure. HOME CARE INSTRUCTIONS  You may shower 24 hours after the procedure. Remove the bandage (dressing) and gently wash the site with plain soap and water. Gently pat the site dry.   Do not apply powder or lotion to the site.   Do not sit in a bathtub, swimming pool, or whirlpool for 5 to 7 days.   No bending, squatting, or lifting anything over 10 pounds (4.5 kg) as directed by your caregiver.   Inspect the site at least twice daily.   Do not drive home if you are discharged the same day of the procedure. Have someone else drive you.   You may drive 24 hours after the procedure unless otherwise instructed by your caregiver.  What to expect:  Any bruising will usually fade within 1 to 2 weeks.   Blood that collects in the tissue (hematoma) may be painful to the touch. It should usually decrease in size and tenderness within 1 to 2 weeks.  SEEK IMMEDIATE MEDICAL CARE IF:  You have unusual pain at the groin site or down the affected leg.   You have redness, warmth, swelling, or pain at the groin site.   You have drainage (other than a small amount of blood on the dressing).   You have chills.   You have a fever or persistent symptoms for more than 72 hours.   You have a fever and your symptoms suddenly get worse.   Your leg becomes pale, cool, tingly, or numb.   You have heavy bleeding from the site. Hold pressure on the site.  Document Released: 02/20/2010 Document Revised: 01/07/2011 Document Reviewed: 02/20/2010 [employer] Patient Information 2012 [employer]. 

## 2011-04-06 LAB — POCT I-STAT, CHEM 8
BUN: 27 mg/dL — ABNORMAL HIGH (ref 6–23)
Calcium, Ion: 1.1 mmol/L — ABNORMAL LOW (ref 1.12–1.32)
Chloride: 109 mEq/L (ref 96–112)
Creatinine, Ser: 0.7 mg/dL (ref 0.50–1.10)
Glucose, Bld: 170 mg/dL — ABNORMAL HIGH (ref 70–99)
HCT: 44 % (ref 36.0–46.0)
Hemoglobin: 15 g/dL (ref 12.0–15.0)
Potassium: 4.2 mEq/L (ref 3.5–5.1)
Sodium: 140 mEq/L (ref 135–145)
TCO2: 26 mmol/L (ref 0–100)

## 2011-04-06 LAB — POCT ACTIVATED CLOTTING TIME: Activated Clotting Time: 138 seconds

## 2011-04-07 ENCOUNTER — Encounter: Payer: Self-pay | Admitting: *Deleted

## 2011-04-07 ENCOUNTER — Other Ambulatory Visit: Payer: Self-pay | Admitting: *Deleted

## 2011-04-07 NOTE — Consult Note (Signed)
[name] [name], [name] [name]                ACCOUNT NO.:  [id number]  MEDICAL RECORD NO.:  [id number]  LOCATION:  [facility]                         FACILITY:  [place]  PHYSICIAN:  Latrelle Fuston K. Camerin Ladouceur, M.D.DATE OF BIRTH:  [birth year]  DATE OF CONSULTATION: DATE OF DISCHARGE:  04/05/2011                                CONSULTATION   CLINICAL HISTORY:  Suspected carotid stenosis by ultrasound.  EXAMINATION:  Intracranial interpretation of bilateral carotid arteriograms.  The right common carotid arteriogram demonstrates the right internal carotid artery at the cervical petrous junction to be normal.  There is a mild stenosis at the petrous cavernous junction.  Distal to this, the distal cavernous and supraclinoid segments are normal.  The right middle and right anterior cerebral arteries are opacified normally into the capillary and venous phases.  Cross opacification via the anterior communicating artery of the left anterior cerebral artery A2 segment is seen.  The left common carotid arteriogram demonstrates the left internal carotid artery in the cervical petrous junction to be normal.  There is approximately 50-60% stenosis of the left internal carotid artery in the distal horizontal segment.  The distal cavernous segment demonstrates a focal broad-based bulge which may represent an intracranial aneurysm.  The supraclinoid segment is normal.  The left middle cerebral artery and the left anterior cerebral artery demonstrate normal opacification in the capillary and venous phases.  IMPRESSION: 1. Probable approximately 2.53-mm saccular aneurysm involving the left     posterior communicating artery region. 2. Approximately 50-60% stenosis of the distal horizontal segment of     the left internal carotid artery. 3. Incidental note made of mild FMD-like changes involving the right     internal carotid artery at the C2 level.          ______________________________ Fritz Pickerel.  Estanislado Pandy, M.D.     SKD/MEDQ  D:  04/06/2011  T:  04/07/2011  Job:  [id number]

## 2011-04-12 ENCOUNTER — Telehealth: Payer: Self-pay | Admitting: Cardiology

## 2011-04-12 NOTE — Telephone Encounter (Signed)
New msg Pt said she is having surgery [phone] for carotid artery and she wanted to see Dr [name] before then.

## 2011-04-12 NOTE — Telephone Encounter (Signed)
I spoke with the pt and she has a scheduled appointment to see Dr [name] on 04/23/11.  The pt is scheduled for carotid endarterectomy on 04/22/11.  The pt would like to see Dr [name] prior to this procedure.  I have scheduled the pt to see Dr [name] on 04/19/11 at 4:30.

## 2011-04-14 ENCOUNTER — Encounter (HOSPITAL_COMMUNITY): Payer: Self-pay | Admitting: Pharmacy Technician

## 2011-04-15 ENCOUNTER — Other Ambulatory Visit (HOSPITAL_COMMUNITY): Payer: Self-pay | Admitting: *Deleted

## 2011-04-16 ENCOUNTER — Encounter (HOSPITAL_COMMUNITY): Payer: Self-pay

## 2011-04-16 ENCOUNTER — Encounter (HOSPITAL_COMMUNITY)
Admission: RE | Admit: 2011-04-16 | Discharge: 2011-04-16 | Disposition: A | Discharge status: Home / Self Care | Payer: 59 | Source: Ambulatory Visit | Attending: Vascular Surgery | Admitting: Vascular Surgery

## 2011-04-16 ENCOUNTER — Other Ambulatory Visit: Payer: Self-pay

## 2011-04-16 DIAGNOSIS — Z01812 Encounter for preprocedural laboratory examination: Secondary | ICD-10-CM | POA: Insufficient documentation

## 2011-04-16 DIAGNOSIS — Z01810 Encounter for preprocedural cardiovascular examination: Secondary | ICD-10-CM | POA: Insufficient documentation

## 2011-04-16 DIAGNOSIS — I2510 Atherosclerotic heart disease of native coronary artery without angina pectoris: Secondary | ICD-10-CM | POA: Insufficient documentation

## 2011-04-16 DIAGNOSIS — I498 Other specified cardiac arrhythmias: Secondary | ICD-10-CM | POA: Insufficient documentation

## 2011-04-16 DIAGNOSIS — I200 Unstable angina: Secondary | ICD-10-CM | POA: Insufficient documentation

## 2011-04-16 DIAGNOSIS — I447 Left bundle-branch block, unspecified: Secondary | ICD-10-CM | POA: Insufficient documentation

## 2011-04-16 HISTORY — DX: Anxiety disorder, unspecified: F41.9

## 2011-04-16 HISTORY — DX: Myoneural disorder, unspecified: G70.9

## 2011-04-16 HISTORY — DX: Cardiac arrhythmia, unspecified: I49.9

## 2011-04-16 HISTORY — DX: Cardiac murmur, unspecified: R01.1

## 2011-04-16 HISTORY — DX: Nausea with vomiting, unspecified: Z98.890

## 2011-04-16 HISTORY — DX: Shortness of breath: R06.02

## 2011-04-16 HISTORY — DX: Personal history of other diseases of the digestive system: Z87.19

## 2011-04-16 HISTORY — DX: Other specified postprocedural states: R11.2

## 2011-04-16 LAB — DIFFERENTIAL
Basophils Absolute: 0 10*3/uL (ref 0.0–0.1)
Basophils Relative: 0 % (ref 0–1)
Eosinophils Absolute: 0.2 10*3/uL (ref 0.0–0.7)
Eosinophils Relative: 2 % (ref 0–5)
Lymphocytes Relative: 22 % (ref 12–46)
Lymphs Abs: 2.3 10*3/uL (ref 0.7–4.0)
Monocytes Absolute: 0.9 10*3/uL (ref 0.1–1.0)
Monocytes Relative: 8 % (ref 3–12)
Neutro Abs: 7.2 10*3/uL (ref 1.7–7.7)
Neutrophils Relative %: 68 % (ref 43–77)

## 2011-04-16 LAB — APTT: aPTT: 27 seconds (ref 24–37)

## 2011-04-16 LAB — TYPE AND SCREEN
ABO/RH(D): AB POS
Antibody Screen: NEGATIVE

## 2011-04-16 LAB — URINE MICROSCOPIC-ADD ON

## 2011-04-16 LAB — URINALYSIS, ROUTINE W REFLEX MICROSCOPIC
Glucose, UA: NEGATIVE mg/dL
Hgb urine dipstick: NEGATIVE
Ketones, ur: 15 mg/dL — AB
Nitrite: NEGATIVE
Protein, ur: 30 mg/dL — AB
Specific Gravity, Urine: 1.037 — ABNORMAL HIGH (ref 1.005–1.030)
Urobilinogen, UA: 1 mg/dL (ref 0.0–1.0)
pH: 5 (ref 5.0–8.0)

## 2011-04-16 LAB — COMPREHENSIVE METABOLIC PANEL
ALT: 16 U/L (ref 0–35)
AST: 11 U/L (ref 0–37)
Albumin: 3.6 g/dL (ref 3.5–5.2)
Alkaline Phosphatase: 70 U/L (ref 39–117)
BUN: 22 mg/dL (ref 6–23)
CO2: 26 mEq/L (ref 19–32)
Calcium: 9.2 mg/dL (ref 8.4–10.5)
Chloride: 106 mEq/L (ref 96–112)
Creatinine, Ser: 0.72 mg/dL (ref 0.50–1.10)
GFR calc Af Amer: >90 mL/min (ref >90)
GFR calc non Af Amer: 89 mL/min — ABNORMAL LOW (ref >90)
Glucose, Bld: 147 mg/dL — ABNORMAL HIGH (ref 70–99)
Potassium: 4.3 mEq/L (ref 3.5–5.1)
Sodium: 141 mEq/L (ref 135–145)
Total Bilirubin: 0.7 mg/dL (ref 0.3–1.2)
Total Protein: 6.9 g/dL (ref 6.0–8.3)

## 2011-04-16 LAB — CBC
HCT: 45.3 % (ref 36.0–46.0)
Hemoglobin: 15.5 g/dL — ABNORMAL HIGH (ref 12.0–15.0)
MCH: 31.7 pg (ref 26.0–34.0)
MCHC: 34.2 g/dL (ref 30.0–36.0)
MCV: 92.6 fL (ref 78.0–100.0)
Platelets: 256 10*3/uL (ref 150–400)
RBC: 4.89 MIL/uL (ref 3.87–5.11)
RDW: 13.3 % (ref 11.5–15.5)
WBC: 10.6 10*3/uL — ABNORMAL HIGH (ref 4.0–10.5)

## 2011-04-16 LAB — SURGICAL PCR SCREEN
MRSA, PCR: NEGATIVE
Staphylococcus aureus: NEGATIVE

## 2011-04-16 MED ORDER — CHLORHEXIDINE GLUCONATE 4 % EX LIQD: 60.0000 mL | Freq: Once | CUTANEOUS | Status: DC

## 2011-04-16 NOTE — Pre-Procedure Instructions (Addendum)
[name] [name]  04/16/2011   Your procedure is scheduled on:  04/22/11  Report to [facility] at 730 AM.  Call this number if you have problems the morning of surgery: [phone]   Remember:   Do not eat food:After Midnight.  May have clear liquids: up to 4 Hours before arrival 330 am.  Clear liquids include soda, tea, black coffee, apple or grape juice, broth.  Take these medicines the morning of surgery with A SIP OF WATER:inhalers, nebulizer, prozac, xanax,,metoprolol ,prilosec, zantac, zofran,lorcet   Do not wear jewelry, make-up or nail polish.  Do not wear lotions, powders, or perfumes. You may wear deodorant.  Do not shave 48 hours prior to surgery.  Do not bring valuables to the hospital.  Contacts, dentures or bridgework may not be worn into surgery.  Leave suitcase in the car. After surgery it may be brought to your room.  For patients admitted to the hospital, checkout time is 11:00 AM the day of discharge.   Patients discharged the day of surgery will not be allowed to drive home.  Name and phone number of your driver: [name] [phone]  Special Instructions: Incentive Spirometry - Practice and bring it with you on the day of surgery. and CHG Shower Use Special Wash: 1/2 bottle night before surgery and 1/2 bottle morning of surgery.   Please read over the following fact sheets that you were given: Pain Booklet, Coughing and Deep Breathing, Blood Transfusion Information, MRSA Information and Surgical Site Infection Prevention

## 2011-04-16 NOTE — Progress Notes (Addendum)
Note from dr [name] from 2.18.13. Has new appt  On 3.18.13 with dr [name]. echo from 6.11.10  ekg [id number].cxr 6.11.12

## 2011-04-19 ENCOUNTER — Ambulatory Visit (INDEPENDENT_AMBULATORY_CARE_PROVIDER_SITE_OTHER): Payer: 59 | Admitting: Cardiology

## 2011-04-19 VITALS — BP 142/80 | HR 56 | Wt 259.8 lb

## 2011-04-19 DIAGNOSIS — R479 Unspecified speech disturbances: Secondary | ICD-10-CM

## 2011-04-19 DIAGNOSIS — G4733 Obstructive sleep apnea (adult) (pediatric): Secondary | ICD-10-CM

## 2011-04-19 DIAGNOSIS — I2510 Atherosclerotic heart disease of native coronary artery without angina pectoris: Secondary | ICD-10-CM

## 2011-04-19 DIAGNOSIS — R4789 Other speech disturbances: Secondary | ICD-10-CM

## 2011-04-19 DIAGNOSIS — E785 Hyperlipidemia, unspecified: Secondary | ICD-10-CM

## 2011-04-19 DIAGNOSIS — I10 Essential (primary) hypertension: Secondary | ICD-10-CM

## 2011-04-19 NOTE — Patient Instructions (Signed)
Your physician recommends that you schedule a follow-up appointment in: 6 WEEKS  Your physician recommends that you continue on your current medications as directed. Please refer to the Current Medication list given to you today.  

## 2011-04-19 NOTE — Progress Notes (Signed)
HPI:  The patient has seen Dr. Scot Dock, and had carotid angio, and is tentatively scheduled for surgery.  She has also seen Dr. Jannifer Franklin, but I do not have that information as of yet.  She has some chest pain, but it is fairly minor compared to what she had previously, and prior to her CABG.  She has had periods of difficulty with language, and is right handed.  Her carotids do demonstrate some disease.    Current Outpatient Prescriptions  Medication Sig Dispense Refill  . albuterol (PROVENTIL) (2.5 MG/3ML) 0.083% nebulizer solution Take 2.5 mg by nebulization every 6 (six) hours as needed. For shortness of breath.      Marland Kitchen albuterol (PROVENTIL,VENTOLIN) 90 MCG/ACT inhaler Inhale 2 puffs into the lungs every 6 (six) hours as needed. For shortness of breath.      . ALPRAZolam (XANAX) 0.5 MG tablet Take 0.5 mg by mouth 3 (three) times daily as needed. For anxiety - Patient takes 1 in the morning and 1 in the evening daily. If needed she may take one in the afternoon.      Marland Kitchen APAP-Isometheptene-Dichloral 325-65-100 MG per capsule Take 1-2 capsules by mouth every 1 hour x 2 doses. As needed for migraines. Take 2 capsules at migraine onset and if no relief within 45 minutes to 1 hour take 1 additional capsule.      Marland Kitchen aspirin EC 81 MG tablet Take 81 mg by mouth daily.      . B Complex-C (B-COMPLEX WITH VITAMIN C) tablet Take 1 tablet by mouth daily.        . Calcium 1500 MG tablet Take 1,500 mg by mouth daily.       . Cholecalciferol (VITAMIN D3) 1000 UNITS CAPS Take 1,000 Units by mouth daily.       . ferrous sulfate 325 (65 FE) MG tablet Take 325 mg by mouth daily with breakfast.      . FLUoxetine (PROZAC) 40 MG capsule Take 40 mg by mouth daily.      . fluticasone (FLOVENT HFA) 110 MCG/ACT inhaler Inhale 2 puffs into the lungs 2 (two) times daily as needed. As needed for asthma.      . Fluticasone-Salmeterol (ADVAIR DISKUS) 250-50 MCG/DOSE AEPB Inhale 1 puff into the lungs every 12 (twelve) hours as  needed. For asthma symptoms      . furosemide (LASIX) 40 MG tablet Take 40 mg by mouth 2 (two) times daily.       Marland Kitchen gabapentin (NEURONTIN) 600 MG tablet Take 600 mg by mouth at bedtime.       . hydrALAZINE (APRESOLINE) 50 MG tablet Take 50 mg by mouth 3 (three) times daily. Does not take dose if BP low      . hydrocodone-acetaminophen (LORCET-HD) 5-500 MG per capsule Take 1 capsule by mouth every 6 (six) hours as needed. As needed for pain.      Marland Kitchen insulin glargine (LANTUS) 100 UNIT/ML injection Inject 65 Units into the skin daily before breakfast.       . losartan (COZAAR) 100 MG tablet Take 100 mg by mouth daily.        . Meclizine HCl (BONINE) 25 MG CHEW Chew by mouth as needed.      . metoprolol (LOPRESSOR) 100 MG tablet Take 1 tablet (100 mg total) by mouth 2 (two) times daily.  60 tablet  6  . Multiple Vitamin (MULTIVITAMIN) tablet Take 1 tablet by mouth daily.        Marland Kitchen  niacin (NIASPAN) 500 MG CR tablet Take 500 mg by mouth 2 (two) times daily.      Marland Kitchen omeprazole (PRILOSEC) 20 MG capsule Take 20 mg by mouth daily.      . ondansetron (ZOFRAN) 4 MG tablet Take 4 mg by mouth every 8 (eight) hours as needed. For nausea       . ranitidine (ZANTAC) 150 MG tablet Take 150 mg by mouth 2 (two) times daily.        . rosuvastatin (CRESTOR) 40 MG tablet Take 20 mg by mouth daily.       Marland Kitchen zolpidem (AMBIEN) 10 MG tablet Take 10 mg by mouth at bedtime as needed. For sleep.        Allergies  Allergen Reactions  . Amoxicillin Shortness Of Breath and Rash  . Erythromycin Other (See Comments)    Trouble swallowing  . Penicillins Shortness Of Breath and Rash  . Metformin Nausea Only and Other (See Comments)     dizzy    Past Medical History  Diagnosis Date  . Migraine   . Osteoarthritis   . Vomiting     persistent  . Asthma   . Ventral hernia     hx of  . Diabetes mellitus   . GERD (gastroesophageal reflux disease)   . Obesity 01-2010  . Hyperlipidemia   . Hypertension   . H/O:  hysterectomy   . Irregular heart beat   . Myocardial infarction 2011    most recent 2011  . Coronary artery disease   . Heart murmur   . Shortness of breath   . Obstructive sleep apnea     no cpap- sleep study 5 yrs ago  . Anxiety   . PONV (postoperative nausea and vomiting)   . Peripheral vascular disease     ? numbness, tingling arms and legs  . Anemia     hx  . H/O hiatal hernia   . Neuromuscular disorder     ?  Marland Kitchen Dysrhythmia     Past Surgical History  Procedure Date  . Tumor removal     ovarian  . Leg surgery     right  . Hernia repair   . Angioplasty     multiple times  . Cholecysterc   . Cholecystectomy   . Gastric stapling 1970  . Abdominal hysterectomy   . Ankle fracture surgery 70's    lft   . Cardiac catheterization ,01,02,05,06,07,08    stents 02  . Coronary angioplasty   . Fracture surgery   . Appendectomy   . Coronary artery bypass graft 12/11    Family History  Problem Relation Age of Onset  . Heart disease Father     Heart Disease before age 56  . Diabetes Father   . Hyperlipidemia Father   . Hypertension Father   . Heart attack Father   . Diabetes      family hx of paternal grandmother, aunt and grandfather- uncles  . Hypertension Mother   . Cancer Sister     History   Social History  . Marital Status: Married    Spouse Name: N/A    Number of Children: N/A  . Years of Education: N/A   Occupational History  . Not on file.   Social History Main Topics  . Smoking status: Never Smoker   . Smokeless tobacco: Not on file  . Alcohol Use: No  . Drug Use: No  . Sexually Active: Not on file  hysterectomy   Other Topics Concern  . Not on file   Social History Narrative  . No narrative on file    ROS: Please see the HPI.  All other systems reviewed and negative.  PHYSICAL EXAM:  BP 142/80  Pulse 56  Wt 259 lb 12.8 oz (117.845 kg)  General: Very pleasant female, obese , in no acute distress. Head:  Normocephalic and  atraumatic. Neck: no JVD.  I still do not appreciate much of a bruit but ?soft left carotid. Lungs: Clear to auscultation and percussion. Heart: Normal S1 and paradoxical S2.    Abdomen:  Normal bowel sounds; soft; non tender; no organomegaly Pulses: Pulses normal in all 4 extremities. Extremities: No clubbing or cyanosis. No edema. Neurologic: Alert and oriented x 3.  Completely normal.    EKG:   ASSESSMENT AND PLAN:

## 2011-04-20 ENCOUNTER — Encounter (HOSPITAL_COMMUNITY): Payer: Self-pay | Admitting: Vascular Surgery

## 2011-04-20 NOTE — Consult Note (Addendum)
Anesthesia:  Patient is a 65 year old female scheduled for a left CEA on 04/22/11 for left ICA stenosis with history of expressive aphasia.  Other history includes non-smoker, migraines, GERD, ventral hernia, OA, DM2 on insulin, morbid obesity with history of gastric stapling, difficult to control HTN, murmur, CAD/MI s/p Cx stent 2006 and CABG X 5 (LIMA to LAD, SVGs to DIAG, OM1 -> OM2, PD) on 1229/11, SOB, anxiety, PVD, HH, HLD, OSA without CPAP use, persistent vomiting, known left BBB.  She is followed by PCP Dr.Blomgren, Cardiologist Dr. [name], and Nephrologist Dr. Lorrene Reid.  She was seen by Dr. [name] on 04/19/11 for a pre-operative evaluation per patient's request.  His note is not yet available for review.    Her EKG from 04/16/11 showed SB at 52 bpm, left BBB, not felt significantly changed.  Cardiac cath from 11/10/09 showed: Overall preserved LV function with inferiobasal akinetic segment, total occlusion of the RCA (known), modest plaque in the LAD, moderately severe plaque involving the ostium of modest size diagonal branch, and no evidence of restenosis in the previously placed Cx stent. She ultimately underwent CABG on 01/29/10 by Dr. Cyndia Bent.  Echo from 07/12/08 showed: 1. Left ventricle: There is akinesis at the base of the posterior wall. The other walls move well. The cavity size was normal. Wall thickness was increased in a pattern of mild LVH. Systolic function was normal. The estimated ejection fraction was in the range of 55% to 60%. 2. Mitral valve: Mildly calcified annulus. Moderate regurgitation. 3. Left atrium: The atrium was mildly dilated. Impressions: - Tissue doppler data suggests that there may be some increase in LV filling pressure.  Labs noted.  CXR from 12/30/10 showed stable cardiomegaly. No active lung disease.    I'll review Dr. Maren Beach notes when available.    Addendum:  04/21/11 1020  Dr. Maren Beach note is still not available yet in Epic; however, I was  just notified by OR scheduling that the case has been cancelled for tomorrow.

## 2011-04-21 ENCOUNTER — Telehealth: Payer: Self-pay | Admitting: Cardiology

## 2011-04-21 ENCOUNTER — Other Ambulatory Visit: Payer: Self-pay | Admitting: *Deleted

## 2011-04-21 DIAGNOSIS — I6529 Occlusion and stenosis of unspecified carotid artery: Secondary | ICD-10-CM

## 2011-04-21 NOTE — Telephone Encounter (Signed)
New Msg: Pt calling wanting to speak with nurse regarding surgery pt was planning to have tomorrow that was cancelled. Pt stated that was really upset that the surgery was cancelled and would like to speak with nurse/Md regarding this. Please return pt call to discuss further.

## 2011-04-21 NOTE — Telephone Encounter (Signed)
I spoke with Dr [name] and made him aware that surgery was cancelled.  He also recommended that the pt follow-up with Dr Jannifer Franklin.  Per Dr [name] the pt may not need plavix and would need to consult Dr Jannifer Franklin about which drug is appropriate for her treatment (Neurology may use Aggrenox).

## 2011-04-21 NOTE — Telephone Encounter (Signed)
I spoke with the pt and her carotid endarterectomy has been cancelled.  The pt said that Dr Scot Dock called her this morning and told her that Dr Jannifer Franklin (Neurology) did some tests and he did not agree with Dr Scot Dock about cause of symptoms.  Per the pt Dr Jannifer Franklin feels her symptoms are related to migraines and recommended medical therapy.  Dr Scot Dock recommended that the pt should be taking  ASA and Plavix.  The pt does not have any scheduled follow-up with Dr Jannifer Franklin.  The pt is due to see Dr Scot Dock again in 4 months with a repeat carotid.  The pt is very upset and crying on the phone and is unsure what she should do next.  I instructed the pt that she should contact Dr Tobey Grim office and arrange an appointment to discuss her concerns. I told the pt that I would let Dr [name] know that her surgery had been cancelled.  The pt agreed with plan.  The pt also wanted to know which physician should prescribe Plavix.

## 2011-04-22 ENCOUNTER — Encounter (HOSPITAL_COMMUNITY): Admission: RE | Payer: Self-pay | Source: Ambulatory Visit

## 2011-04-22 ENCOUNTER — Ambulatory Visit (HOSPITAL_COMMUNITY): Admission: RE | Admit: 2011-04-22 | Payer: 59 | Source: Ambulatory Visit | Admitting: Vascular Surgery

## 2011-04-22 SURGERY — ENDARTERECTOMY, CAROTID
Anesthesia: General | Site: Neck | Laterality: Left

## 2011-04-23 ENCOUNTER — Ambulatory Visit: Payer: 59 | Admitting: Cardiology

## 2011-05-09 NOTE — Assessment & Plan Note (Signed)
Does not use CPAP. 

## 2011-05-09 NOTE — Assessment & Plan Note (Signed)
Under the care of Dr. Sandi Mariscal.

## 2011-05-09 NOTE — Assessment & Plan Note (Signed)
Continues to have some chest pain at times.  I am reluctant to recommend repeat cath at this time.  If worsens,could be done.  I did this previously from the left radial approach.

## 2011-05-09 NOTE — Assessment & Plan Note (Addendum)
Mechanism of this is unclear.  Does have carotid disease, now seeing Dr. Scot Dock, and is also seeing Dr. Jannifer Franklin.  Tentatively scheduled for surgery.  Has an EEG with Dr .Jannifer Franklin.

## 2011-05-09 NOTE — Assessment & Plan Note (Signed)
Today this seems under much better control.  Remains under the care of Dr. Lorrene Reid.  Will continue to monitor, but adjustments in her are difficult in terms of documentable benefit at times.

## 2011-05-11 ENCOUNTER — Ambulatory Visit: Payer: 59 | Admitting: Neurology

## 2011-05-27 ENCOUNTER — Telehealth (INDEPENDENT_AMBULATORY_CARE_PROVIDER_SITE_OTHER): Payer: Self-pay | Admitting: Surgery

## 2011-05-27 NOTE — Telephone Encounter (Signed)
05/27/11 spoke with [facility] @ Dr. Deboraha Sprang office [facility]). She states Dr Sandi Mariscal tried to reach Dr Hassell Done but he was unavailable. Dr Hassell Done tried to call Dr Sandi Mariscal back but was unable to reach him. Per Alyse Low, the patient needs bariatric surgery but is fearful due to her current health conditions. Dr Sandi Mariscal would like someone to sit down and speak with the patient concerning bariatric surgery and answer questions. I advised that Dr Hassell Done would be best suited to handle that conversation due to the patient's health conditions. An appt was scheduled for 06/17/11 @ 10:40 am. Alyse Low will advise the patient. I mailed an appointment card to patient with registration forms for our office. Of note: Alyse Low states patient has attended a seminar but I found no record of attendance within the last year.)

## 2011-06-01 ENCOUNTER — Encounter: Payer: Self-pay | Admitting: Cardiology

## 2011-06-01 ENCOUNTER — Ambulatory Visit (INDEPENDENT_AMBULATORY_CARE_PROVIDER_SITE_OTHER): Payer: 59 | Admitting: Cardiology

## 2011-06-01 VITALS — BP 160/92 | HR 80 | Resp 18 | Ht 64.0 in | Wt 261.0 lb

## 2011-06-01 DIAGNOSIS — R479 Unspecified speech disturbances: Secondary | ICD-10-CM

## 2011-06-01 DIAGNOSIS — R4789 Other speech disturbances: Secondary | ICD-10-CM

## 2011-06-01 DIAGNOSIS — I10 Essential (primary) hypertension: Secondary | ICD-10-CM

## 2011-06-01 DIAGNOSIS — Z87898 Personal history of other specified conditions: Secondary | ICD-10-CM

## 2011-06-01 DIAGNOSIS — I2510 Atherosclerotic heart disease of native coronary artery without angina pectoris: Secondary | ICD-10-CM

## 2011-06-01 NOTE — Patient Instructions (Signed)
Your physician recommends that you schedule a follow-up appointment in: 3 MONTHS with Dr [name]  Your physician recommends that you continue on your current medications as directed. Please refer to the Current Medication list given to you today.

## 2011-06-02 NOTE — Progress Notes (Signed)
HPI:  Patient is seen in follow up.  She continues to have some of the neuro symptoms that she has had prior.  Dr. Jannifer Franklin spoke with Dr. Doren Custard and her carotid surgery was called off as he thought the symptoms were due to her migraines, and these migraines have been present for many years.  She felt much worse with Topamax, and felt worse so she stopped.  She has not followed up with Dr. Jannifer Franklin as well. She has also had some stomach pain, and diarrhea.  She also notes some palpitations.  Not having too much in the way of chest pain.  I encouraged her strongly to follow up with Dr. Jannifer Franklin.     Current Outpatient Prescriptions  Medication Sig Dispense Refill  . albuterol (PROVENTIL) (2.5 MG/3ML) 0.083% nebulizer solution Take 2.5 mg by nebulization every 6 (six) hours as needed. For shortness of breath.      Marland Kitchen albuterol (PROVENTIL,VENTOLIN) 90 MCG/ACT inhaler Inhale 2 puffs into the lungs every 6 (six) hours as needed. For shortness of breath.      . ALPRAZolam (XANAX) 0.5 MG tablet Take 0.5 mg by mouth 3 (three) times daily as needed. For anxiety - Patient takes 1 in the morning and 1 in the evening daily. If needed she may take one in the afternoon.      Marland Kitchen aspirin EC 81 MG tablet Take 81 mg by mouth daily.      . B Complex-C (B-COMPLEX WITH VITAMIN C) tablet Take 1 tablet by mouth daily.        . Calcium 1500 MG tablet Take 1,500 mg by mouth daily.       . Cholecalciferol (VITAMIN D3) 1000 UNITS CAPS Take 1,000 Units by mouth daily.       . ferrous sulfate 325 (65 FE) MG tablet Take 325 mg by mouth daily with breakfast.      . FLUoxetine (PROZAC) 40 MG capsule Take 40 mg by mouth daily.      . fluticasone (FLOVENT HFA) 110 MCG/ACT inhaler Inhale 2 puffs into the lungs 2 (two) times daily as needed. As needed for asthma.      . Fluticasone-Salmeterol (ADVAIR DISKUS) 250-50 MCG/DOSE AEPB Inhale 1 puff into the lungs every 12 (twelve) hours as needed. For asthma symptoms      . furosemide (LASIX) 40  MG tablet Take 40 mg by mouth 2 (two) times daily.       Marland Kitchen gabapentin (NEURONTIN) 600 MG tablet Take 600 mg by mouth at bedtime.       . hydrALAZINE (APRESOLINE) 50 MG tablet Take 50 mg by mouth 3 (three) times daily. Does not take dose if BP low      . hydrocodone-acetaminophen (LORCET-HD) 5-500 MG per capsule Take 1 capsule by mouth every 6 (six) hours as needed. As needed for pain.      Marland Kitchen insulin glargine (LANTUS) 100 UNIT/ML injection Inject 65 Units into the skin daily before breakfast.       . losartan (COZAAR) 100 MG tablet Take 100 mg by mouth daily.        . Meclizine HCl (BONINE) 25 MG CHEW Chew by mouth as needed.      . metoprolol (LOPRESSOR) 100 MG tablet Take 1 tablet (100 mg total) by mouth 2 (two) times daily.  60 tablet  6  . Multiple Vitamin (MULTIVITAMIN) tablet Take 1 tablet by mouth daily.        . niacin (NIASPAN) 500 MG CR  tablet Take 500 mg by mouth 2 (two) times daily.      Marland Kitchen omeprazole (PRILOSEC) 20 MG capsule Take 20 mg by mouth daily.      . ondansetron (ZOFRAN) 4 MG tablet Take 4 mg by mouth every 8 (eight) hours as needed. For nausea       . ranitidine (ZANTAC) 150 MG tablet Take 150 mg by mouth 2 (two) times daily.        . rosuvastatin (CRESTOR) 40 MG tablet Take 20 mg by mouth daily.       Marland Kitchen zolpidem (AMBIEN) 10 MG tablet Take 10 mg by mouth at bedtime as needed. For sleep.      Marland Kitchen APAP-Isometheptene-Dichloral 325-65-100 MG per capsule Take 1-2 capsules by mouth every 1 hour x 2 doses. As needed for migraines. Take 2 capsules at migraine onset and if no relief within 45 minutes to 1 hour take 1 additional capsule.        Allergies  Allergen Reactions  . Amoxicillin Shortness Of Breath and Rash  . Erythromycin Other (See Comments)    Trouble swallowing  . Penicillins Shortness Of Breath and Rash  . Metformin Nausea Only and Other (See Comments)     dizzy    Past Medical History  Diagnosis Date  . Migraine   . Osteoarthritis   . Vomiting     persistent    . Asthma   . Ventral hernia     hx of  . Diabetes mellitus   . GERD (gastroesophageal reflux disease)   . Obesity 01-2010  . Hyperlipidemia   . Hypertension   . H/O: hysterectomy   . Irregular heart beat   . Myocardial infarction 2011    most recent 2011  . Coronary artery disease   . Heart murmur   . Shortness of breath   . Obstructive sleep apnea     no cpap- sleep study 5 yrs ago  . Anxiety   . PONV (postoperative nausea and vomiting)   . Peripheral vascular disease     ? numbness, tingling arms and legs  . Anemia     hx  . H/O hiatal hernia   . Neuromuscular disorder     ?  Marland Kitchen Dysrhythmia     Past Surgical History  Procedure Date  . Tumor removal     ovarian  . Leg surgery     right  . Hernia repair   . Angioplasty     multiple times  . Cholecysterc   . Cholecystectomy   . Gastric stapling 1970  . Abdominal hysterectomy   . Ankle fracture surgery 70's    lft   . Cardiac catheterization ,01,02,05,06,07,08    stents 02  . Coronary angioplasty   . Fracture surgery   . Appendectomy   . Coronary artery bypass graft 12/11    Family History  Problem Relation Age of Onset  . Heart disease Father     Heart Disease before age 6  . Diabetes Father   . Hyperlipidemia Father   . Hypertension Father   . Heart attack Father   . Diabetes      family hx of paternal grandmother, aunt and grandfather- uncles  . Hypertension Mother   . Cancer Sister     History   Social History  . Marital Status: Married    Spouse Name: N/A    Number of Children: N/A  . Years of Education: N/A   Occupational History  . Not on  file.   Social History Main Topics  . Smoking status: Never Smoker   . Smokeless tobacco: Not on file  . Alcohol Use: No  . Drug Use: No  . Sexually Active: Not on file     hysterectomy   Other Topics Concern  . Not on file   Social History Narrative  . No narrative on file    ROS: Please see the HPI.  All other systems reviewed and  negative.  PHYSICAL EXAM:  BP 160/92  Pulse 80  Resp 18  Ht 5\' 4"  (1.626 m)  Wt 261 lb (118.389 kg)  BMI 44.80 kg/m2  SpO2 98%  General: Well developed, well nourished, in no acute distress. Head:  Normocephalic and atraumatic. Neck: no JVD Lungs: Clear to auscultation and percussion. Heart: Normal S1 and S2.  No murmur, rubs or gallops.  Abdomen:  Normal bowel sounds; soft; non tender; no organomegaly Pulses: Pulses normal in all 4 extremities. Extremities: No clubbing or cyanosis. No edema. Neurologic: Alert and oriented x 3.  EKG:  ASSESSMENT AND PLAN:

## 2011-06-17 ENCOUNTER — Ambulatory Visit (INDEPENDENT_AMBULATORY_CARE_PROVIDER_SITE_OTHER): Payer: 59 | Admitting: Surgery

## 2011-06-17 ENCOUNTER — Encounter (INDEPENDENT_AMBULATORY_CARE_PROVIDER_SITE_OTHER): Payer: Self-pay | Admitting: Surgery

## 2011-06-17 VITALS — BP 138/84 | HR 80 | Temp 98.3°F | Resp 16 | Ht 62.5 in | Wt 262.0 lb

## 2011-06-17 DIAGNOSIS — Z9884 Bariatric surgery status: Secondary | ICD-10-CM | POA: Insufficient documentation

## 2011-06-17 NOTE — Assessment & Plan Note (Signed)
Encouraged her to follow up with Dr. Jannifer Franklin.

## 2011-06-17 NOTE — Assessment & Plan Note (Signed)
Seems to have been better since her bypass, but more recently has had a bit more discomfort. Not sure that I would pursue that at this point unless it progresses given her multiple other issues.

## 2011-06-17 NOTE — Assessment & Plan Note (Signed)
Still a team effort with regard to management.  A bit better today.  Has OSA which is untreated, and likely not helpful.

## 2011-06-17 NOTE — Assessment & Plan Note (Signed)
See notes of Dr. Doren Custard and Jannifer Franklin.

## 2011-06-17 NOTE — Progress Notes (Signed)
Chief Complaint:  Obesity after failed gastroplasty  History of Present Illness:  [name] [name] is an 66 y.o. female who Has had weight loss diffuse throughout her life. She had undergone a gastroplasty by Dr. Alwyn Pea in 1984 at [facility]. She had complications but ultimately failed to lose significant weight in this open operation. She had postoperative hernias and I ended up operating on him repairing as well as I did a laparoscopic cholecystectomy on her. In my recollection she may have significant adhesions to her small bowel which may make a Roux-en-Y gastric bypass not possible. She may however be a candidate for a laparoscopic adjustable gastric banding or possible sleeve gastrectomy. She is understandably concerned and a bit friable bye this lost at least look at her possibilities and make an informed decision.  As the first move I think it would be helpful if Dr. [name] endoscope her and looked at her previous staple line and examining the size of her pouch and its location. Back in the 80s there were 2 types of pouch is done one based along the lesser curvature of the stomach and the other along the greater curvature of the stomach. It may be that she had a complete dehiscence of her staple line at these things we can see with endoscopy.  Plan is to perform the endoscopy and then come back and talk about what her options are.  Past Medical History  Diagnosis Date  . Migraine   . Osteoarthritis   . Vomiting     persistent  . Asthma   . Ventral hernia     hx of  . Diabetes mellitus   . GERD (gastroesophageal reflux disease)   . Obesity 01-2010  . Hyperlipidemia   . Hypertension   . H/O: hysterectomy   . Irregular heart beat   . Myocardial infarction 2011    most recent 2011  . Coronary artery disease   . Heart murmur   . Shortness of breath   . Obstructive sleep apnea     no cpap- sleep study 5 yrs ago  . Anxiety   . PONV (postoperative nausea and vomiting)   .  Peripheral vascular disease     ? numbness, tingling arms and legs  . Anemia     hx  . H/O hiatal hernia   . Neuromuscular disorder     ?  Marland Kitchen Dysrhythmia     Past Surgical History  Procedure Date  . Tumor removal     ovarian  . Leg surgery     right  . Hernia repair   . Angioplasty     multiple times  . Cholecysterc   . Cholecystectomy   . Gastric stapling 1970  . Abdominal hysterectomy   . Ankle fracture surgery 70's    lft   . Cardiac catheterization ,01,02,05,06,07,08    stents 02  . Coronary angioplasty   . Fracture surgery   . Appendectomy   . Coronary artery bypass graft 12/11    Current Outpatient Prescriptions  Medication Sig Dispense Refill  . albuterol (PROVENTIL) (2.5 MG/3ML) 0.083% nebulizer solution Take 2.5 mg by nebulization every 6 (six) hours as needed. For shortness of breath.      Marland Kitchen albuterol (PROVENTIL,VENTOLIN) 90 MCG/ACT inhaler Inhale 2 puffs into the lungs every 6 (six) hours as needed. For shortness of breath.      . ALPRAZolam (XANAX) 0.5 MG tablet Take 0.5 mg by mouth 3 (three) times daily as needed. For anxiety -  Patient takes 1 in the morning and 1 in the evening daily. If needed she may take one in the afternoon.      Marland Kitchen APAP-Isometheptene-Dichloral 325-65-100 MG per capsule Take 1-2 capsules by mouth every 1 hour x 2 doses. As needed for migraines. Take 2 capsules at migraine onset and if no relief within 45 minutes to 1 hour take 1 additional capsule.      Marland Kitchen aspirin EC 81 MG tablet Take 81 mg by mouth daily.      . B Complex-C (B-COMPLEX WITH VITAMIN C) tablet Take 1 tablet by mouth daily.        . Calcium 1500 MG tablet Take 1,500 mg by mouth daily.       . Cholecalciferol (VITAMIN D3) 1000 UNITS CAPS Take 1,000 Units by mouth daily.       . ferrous sulfate 325 (65 FE) MG tablet Take 325 mg by mouth daily with breakfast.      . fluticasone (FLOVENT HFA) 110 MCG/ACT inhaler Inhale 2 puffs into the lungs 2 (two) times daily as needed. As needed  for asthma.      . Fluticasone-Salmeterol (ADVAIR DISKUS) 250-50 MCG/DOSE AEPB Inhale 1 puff into the lungs every 12 (twelve) hours as needed. For asthma symptoms      . furosemide (LASIX) 40 MG tablet Take 40 mg by mouth 2 (two) times daily.       Marland Kitchen gabapentin (NEURONTIN) 600 MG tablet Take 600 mg by mouth at bedtime.       . hydrALAZINE (APRESOLINE) 50 MG tablet Take 50 mg by mouth 3 (three) times daily. Does not take dose if BP low      . hydrocodone-acetaminophen (LORCET-HD) 5-500 MG per capsule Take 1 capsule by mouth every 6 (six) hours as needed. As needed for pain.      Marland Kitchen insulin glargine (LANTUS) 100 UNIT/ML injection Inject 65 Units into the skin daily before breakfast.       . losartan (COZAAR) 100 MG tablet Take 100 mg by mouth daily.        . Meclizine HCl (BONINE) 25 MG CHEW Chew by mouth as needed.      . metoprolol (LOPRESSOR) 100 MG tablet Take 1 tablet (100 mg total) by mouth 2 (two) times daily.  60 tablet  6  . Multiple Vitamin (MULTIVITAMIN) tablet Take 1 tablet by mouth daily.        . niacin (NIASPAN) 500 MG CR tablet Take 500 mg by mouth 2 (two) times daily.      Marland Kitchen omeprazole (PRILOSEC) 20 MG capsule Take 20 mg by mouth daily.      . ondansetron (ZOFRAN) 4 MG tablet Take 4 mg by mouth every 8 (eight) hours as needed. For nausea       . ranitidine (ZANTAC) 150 MG tablet Take 150 mg by mouth 2 (two) times daily.        . rosuvastatin (CRESTOR) 40 MG tablet Take 20 mg by mouth daily.       Marland Kitchen venlafaxine (EFFEXOR) 37.5 MG tablet Take 37.5 mg by mouth 3 (three) times daily.      Marland Kitchen zolpidem (AMBIEN) 10 MG tablet Take 10 mg by mouth at bedtime as needed. For sleep.       Amoxicillin; Erythromycin; Penicillins; and Metformin Family History  Problem Relation Age of Onset  . Heart disease Father     Heart Disease before age 69  . Diabetes Father   . Hyperlipidemia Father   .  Hypertension Father   . Heart attack Father   . Diabetes      family hx of paternal grandmother,  aunt and grandfather- uncles  . Hypertension Mother   . Cancer Sister    Social History:   reports that she has never smoked. She does not have any smokeless tobacco history on file. She reports that she does not drink alcohol or use illicit drugs.   REVIEW OF SYSTEMS - PERTINENT POSITIVES ONLY: noncontributory  Physical Exam:   Blood pressure 138/84, pulse 80, temperature 98.3 F (36.8 C), temperature source Temporal, resp. rate 16, height 5' 2.5" (1.588 m), weight 262 lb (118.842 kg). Body mass index is 47.16 kg/(m^2).  Gen:  WDWNWF NAD   LABORATORY RESULTS: No results found for this or any previous visit (from the past 48 hour(s)).  RADIOLOGY RESULTS: No results found.  Problem List: Patient Active Problem List  Diagnoses  . DM  . UNSPECIFIED VITAMIN D DEFICIENCY  . HYPERLIPIDEMIA  . Obesity-post failed open gastroplasty 1984   . OBSTRUCTIVE SLEEP APNEA  . HYPERTENSION  . MYOCARDIAL INFARCTION, HX OF  . CORONARY ARTERY DISEASE  . PAPILLARY MUSCLE DYSFUNCTION, NON-RHEUMATIC  . ASTHMA  . GERD  . PERSISTENT VOMITING  . VENTRAL HERNIA  . OSTEOARTHRITIS  . MIGRAINES, HX OF  . Dyspnea  . Speech abnormality    Assessment & Plan: Failed gastroplasty.  Upper endoscopy to assess anatomy and consider revision surgery    Matt B. Hassell Done, MD, [facility] [phone] beeper [phone]  06/17/2011 12:03 PM

## 2011-07-29 ENCOUNTER — Telehealth (INDEPENDENT_AMBULATORY_CARE_PROVIDER_SITE_OTHER): Payer: Self-pay | Admitting: General Surgery

## 2011-07-29 NOTE — Telephone Encounter (Signed)
Pt calling to ask about meds to hold for procedure on 08/03/11.  She received phone call from pre-op screening, but does not need to go in before DOS; her meds were not discussed at that time.  We reviewed her list of meds and she will take her blood pressure meds, hold her Insulin that morning, and stop taking ASA 81 mg and Plavix 75mg  as of today.  The Plavix was started by Dr. Floyde Parkins, neurologist, for a slight blockage of one of her carotid arteries.  She denies any new stents.  Pt states she saw Dr. Jannifer Franklin after her last OV at [facility], so Dr. Hassell Done was not aware she was taking it.

## 2011-08-03 ENCOUNTER — Encounter (HOSPITAL_COMMUNITY): Payer: Self-pay | Admitting: *Deleted

## 2011-08-03 ENCOUNTER — Encounter (HOSPITAL_COMMUNITY)
Admission: RE | Disposition: A | Discharge status: Home / Self Care | Payer: Self-pay | Source: Ambulatory Visit | Attending: Surgery

## 2011-08-03 ENCOUNTER — Ambulatory Visit (HOSPITAL_COMMUNITY)
Admission: RE | Admit: 2011-08-03 | Discharge: 2011-08-03 | Disposition: A | Discharge status: Home / Self Care | Payer: 59 | Source: Ambulatory Visit | Attending: Surgery | Admitting: Surgery

## 2011-08-03 DIAGNOSIS — Z9884 Bariatric surgery status: Secondary | ICD-10-CM

## 2011-08-03 DIAGNOSIS — Z79899 Other long term (current) drug therapy: Secondary | ICD-10-CM | POA: Insufficient documentation

## 2011-08-03 DIAGNOSIS — I10 Essential (primary) hypertension: Secondary | ICD-10-CM | POA: Insufficient documentation

## 2011-08-03 DIAGNOSIS — K2990 Gastroduodenitis, unspecified, without bleeding: Secondary | ICD-10-CM

## 2011-08-03 DIAGNOSIS — E785 Hyperlipidemia, unspecified: Secondary | ICD-10-CM | POA: Insufficient documentation

## 2011-08-03 DIAGNOSIS — K2970 Gastritis, unspecified, without bleeding: Secondary | ICD-10-CM

## 2011-08-03 DIAGNOSIS — I2510 Atherosclerotic heart disease of native coronary artery without angina pectoris: Secondary | ICD-10-CM | POA: Insufficient documentation

## 2011-08-03 DIAGNOSIS — E119 Type 2 diabetes mellitus without complications: Secondary | ICD-10-CM | POA: Insufficient documentation

## 2011-08-03 DIAGNOSIS — K219 Gastro-esophageal reflux disease without esophagitis: Secondary | ICD-10-CM | POA: Insufficient documentation

## 2011-08-03 DIAGNOSIS — I252 Old myocardial infarction: Secondary | ICD-10-CM | POA: Insufficient documentation

## 2011-08-03 DIAGNOSIS — E669 Obesity, unspecified: Secondary | ICD-10-CM | POA: Insufficient documentation

## 2011-08-03 DIAGNOSIS — R1115 Cyclical vomiting syndrome unrelated to migraine: Secondary | ICD-10-CM | POA: Insufficient documentation

## 2011-08-03 DIAGNOSIS — Z951 Presence of aortocoronary bypass graft: Secondary | ICD-10-CM | POA: Insufficient documentation

## 2011-08-03 DIAGNOSIS — G4733 Obstructive sleep apnea (adult) (pediatric): Secondary | ICD-10-CM | POA: Insufficient documentation

## 2011-08-03 HISTORY — DX: Chronic kidney disease, unspecified: N18.9

## 2011-08-03 HISTORY — PX: ESOPHAGOGASTRODUODENOSCOPY: SHX5428

## 2011-08-03 LAB — GLUCOSE, CAPILLARY: Glucose-Capillary: 172 mg/dL — ABNORMAL HIGH (ref 70–99)

## 2011-08-03 SURGERY — EGD (ESOPHAGOGASTRODUODENOSCOPY)
Anesthesia: Moderate Sedation

## 2011-08-03 MED ORDER — FENTANYL CITRATE 0.05 MG/ML IJ SOLN
INTRAMUSCULAR | Status: AC
  Filled 2011-08-03: qty 4

## 2011-08-03 MED ORDER — MIDAZOLAM HCL 10 MG/2ML IJ SOLN
INTRAMUSCULAR | Status: AC
  Filled 2011-08-03: qty 4

## 2011-08-03 MED ORDER — SODIUM CHLORIDE 0.9 % IV SOLN
Freq: Once | INTRAVENOUS | Status: AC
  Administered 2011-08-03: 500 mL via INTRAVENOUS

## 2011-08-03 MED ORDER — BUTAMBEN-TETRACAINE-BENZOCAINE 2-2-14 % EX AERO
INHALATION_SPRAY | CUTANEOUS | Status: DC | PRN
  Administered 2011-08-03: 4 via TOPICAL

## 2011-08-03 MED ORDER — MIDAZOLAM HCL 10 MG/2ML IJ SOLN
INTRAMUSCULAR | Status: DC | PRN
  Administered 2011-08-03: 2 mg via INTRAVENOUS
  Administered 2011-08-03: 1 mg via INTRAVENOUS
  Administered 2011-08-03: 2 mg via INTRAVENOUS

## 2011-08-03 MED ORDER — FENTANYL CITRATE 0.05 MG/ML IJ SOLN
INTRAMUSCULAR | Status: DC | PRN
  Administered 2011-08-03: 50 ug via INTRAVENOUS
  Administered 2011-08-03: 25 ug via INTRAVENOUS

## 2011-08-03 NOTE — H&P (Signed)
Chief Complaint: Obesity after failed gastroplasty   History of Present Illness: [name] [name] is an 66 y.o. female who Has had weight loss diffuse throughout her life. She had undergone a gastroplasty by Dr. Alwyn Pea in 1984 at [facility]. She had complications but ultimately failed to lose significant weight in this open operation. She had postoperative hernias and I ended up operating on him repairing as well as I did a laparoscopic cholecystectomy on her. In my recollection she may have significant adhesions to her small bowel which may make a Roux-en-Y gastric bypass not possible. She may however be a candidate for a laparoscopic adjustable gastric banding or possible sleeve gastrectomy. She is understandably concerned and a bit friable bye this lost at least look at her possibilities and make an informed decision.  As the first move I think it would be helpful if Dr. [name] endoscope her and looked at her previous staple line and examining the size of her pouch and its location. Back in the 80s there were 2 types of pouch is done one based along the lesser curvature of the stomach and the other along the greater curvature of the stomach. It may be that she had a complete dehiscence of her staple line at these things we can see with endoscopy.  Plan is to perform the endoscopy and then come back and talk about what her options are.   Past Medical History   Diagnosis  Date   .  Migraine    .  Osteoarthritis    .  Vomiting      persistent   .  Asthma    .  Ventral hernia      hx of   .  Diabetes mellitus    .  GERD (gastroesophageal reflux disease)    .  Obesity  01-2010   .  Hyperlipidemia    .  Hypertension    .  H/O: hysterectomy    .  Irregular heart beat    .  Myocardial infarction  2011     most recent 2011   .  Coronary artery disease    .  Heart murmur    .  Shortness of breath    .  Obstructive sleep apnea      no cpap- sleep study 5 yrs ago   .  Anxiety    .   PONV (postoperative nausea and vomiting)    .  Peripheral vascular disease      ? numbness, tingling arms and legs   .  Anemia      hx   .  H/O hiatal hernia    .  Neuromuscular disorder      ?   Marland Kitchen  Dysrhythmia     Past Surgical History   Procedure  Date   .  Tumor removal      ovarian   .  Leg surgery      right   .  Hernia repair    .  Angioplasty      multiple times   .  Cholecysterc    .  Cholecystectomy    .  Gastric stapling  1970   .  Abdominal hysterectomy    .  Ankle fracture surgery  70's     lft   .  Cardiac catheterization  ,01,02,05,06,07,08     stents 02   .  Coronary angioplasty    .  Fracture surgery    .  Appendectomy    .  Coronary artery bypass graft  12/11    Current Outpatient Prescriptions   Medication  Sig  Dispense  Refill   .  albuterol (PROVENTIL) (2.5 MG/3ML) 0.083% nebulizer solution  Take 2.5 mg by nebulization every 6 (six) hours as needed. For shortness of breath.     Marland Kitchen  albuterol (PROVENTIL,VENTOLIN) 90 MCG/ACT inhaler  Inhale 2 puffs into the lungs every 6 (six) hours as needed. For shortness of breath.     .  ALPRAZolam (XANAX) 0.5 MG tablet  Take 0.5 mg by mouth 3 (three) times daily as needed. For anxiety - Patient takes 1 in the morning and 1 in the evening daily. If needed she may take one in the afternoon.     Marland Kitchen  APAP-Isometheptene-Dichloral 325-65-100 MG per capsule  Take 1-2 capsules by mouth every 1 hour x 2 doses. As needed for migraines. Take 2 capsules at migraine onset and if no relief within 45 minutes to 1 hour take 1 additional capsule.     Marland Kitchen  aspirin EC 81 MG tablet  Take 81 mg by mouth daily.     .  B Complex-C (B-COMPLEX WITH VITAMIN C) tablet  Take 1 tablet by mouth daily.     .  Calcium 1500 MG tablet  Take 1,500 mg by mouth daily.     .  Cholecalciferol (VITAMIN D3) 1000 UNITS CAPS  Take 1,000 Units by mouth daily.     .  ferrous sulfate 325 (65 FE) MG tablet  Take 325 mg by mouth daily with breakfast.     .   fluticasone (FLOVENT HFA) 110 MCG/ACT inhaler  Inhale 2 puffs into the lungs 2 (two) times daily as needed. As needed for asthma.     .  Fluticasone-Salmeterol (ADVAIR DISKUS) 250-50 MCG/DOSE AEPB  Inhale 1 puff into the lungs every 12 (twelve) hours as needed. For asthma symptoms     .  furosemide (LASIX) 40 MG tablet  Take 40 mg by mouth 2 (two) times daily.     Marland Kitchen  gabapentin (NEURONTIN) 600 MG tablet  Take 600 mg by mouth at bedtime.     .  hydrALAZINE (APRESOLINE) 50 MG tablet  Take 50 mg by mouth 3 (three) times daily. Does not take dose if BP low     .  hydrocodone-acetaminophen (LORCET-HD) 5-500 MG per capsule  Take 1 capsule by mouth every 6 (six) hours as needed. As needed for pain.     Marland Kitchen  insulin glargine (LANTUS) 100 UNIT/ML injection  Inject 65 Units into the skin daily before breakfast.     .  losartan (COZAAR) 100 MG tablet  Take 100 mg by mouth daily.     .  Meclizine HCl (BONINE) 25 MG CHEW  Chew by mouth as needed.     .  metoprolol (LOPRESSOR) 100 MG tablet  Take 1 tablet (100 mg total) by mouth 2 (two) times daily.  60 tablet  6   .  Multiple Vitamin (MULTIVITAMIN) tablet  Take 1 tablet by mouth daily.     .  niacin (NIASPAN) 500 MG CR tablet  Take 500 mg by mouth 2 (two) times daily.     Marland Kitchen  omeprazole (PRILOSEC) 20 MG capsule  Take 20 mg by mouth daily.     .  ondansetron (ZOFRAN) 4 MG tablet  Take 4 mg by mouth every 8 (eight) hours as needed. For nausea     .  ranitidine (ZANTAC) 150 MG tablet  Take  150 mg by mouth 2 (two) times daily.     .  rosuvastatin (CRESTOR) 40 MG tablet  Take 20 mg by mouth daily.     Marland Kitchen  venlafaxine (EFFEXOR) 37.5 MG tablet  Take 37.5 mg by mouth 3 (three) times daily.     Marland Kitchen  zolpidem (AMBIEN) 10 MG tablet  Take 10 mg by mouth at bedtime as needed. For sleep.     Amoxicillin; Erythromycin; Penicillins; and Metformin  Family History   Problem  Relation  Age of Onset   .  Heart disease  Father       Heart Disease before age 65    .  Diabetes   Father    .  Hyperlipidemia  Father    .  Hypertension  Father    .  Heart attack  Father    .  Diabetes        family hx of paternal grandmother, aunt and grandfather- uncles    .  Hypertension  Mother    .  Cancer  Sister     Social History: reports that she has never smoked. She does not have any smokeless tobacco history on file. She reports that she does not drink alcohol or use illicit drugs.   REVIEW OF SYSTEMS - PERTINENT POSITIVES ONLY:  noncontributory   Physical Exam:  BP 185/94  Pulse 67  Resp 13  Ht 5\' 2"  (1.575 m)  Wt 252 lb (114.306 kg)  BMI 46.09 kg/m2  SpO2 96% height 5' 2.5" (1.588 m), weight 262 lb (118.842 kg).  Body mass index is 47.16 kg/(m^2).  Gen: WDWNWF NAD   LABORATORY RESULTS:  No results found for this or any previous visit (from the past 48 hour(s)).  RADIOLOGY RESULTS:  No results found.   Problem List:  Patient Active Problem List   Diagnoses   .  DM   .  UNSPECIFIED VITAMIN D DEFICIENCY   .  HYPERLIPIDEMIA   .  Obesity-post failed open gastroplasty 1984   .  OBSTRUCTIVE SLEEP APNEA   .  HYPERTENSION   .  MYOCARDIAL INFARCTION, HX OF   .  CORONARY ARTERY DISEASE   .  PAPILLARY MUSCLE DYSFUNCTION, NON-RHEUMATIC   .  ASTHMA   .  GERD   .  PERSISTENT VOMITING   .  VENTRAL HERNIA   .  OSTEOARTHRITIS   .  MIGRAINES, HX OF   .  Dyspnea   .  Speech abnormality    Assessment & Plan:  Failed gastroplasty. Upper endoscopy to assess anatomy and consider revision surgery   Matt B. Hassell Done, MD, [facility]   Above is original note from Dr. Hassell Done.  She is here for an endo to evaluate her pouch.  She vomits almost daily, reason unknown.  This was worked up by Dr. Donnetta Hutching bout 3 to 4 years ago.   She had a CABG in Dec. 2011 by Dr. Cyndia Bent.  Dr. [name] is her cardiologist.  She has done well from a cardiac standpoint.  Alphonsa Overall, MD, [facility] Pager: [phone] Office phone:  [phone]

## 2011-08-03 NOTE — Brief Op Note (Addendum)
08/03/2011  11:25 AM  PATIENT:  [name] [name], 67 y.o., female, [name]: [id number]  PREOP DIAGNOSIS:  assess anatomy  POSTOP DIAGNOSIS:   EG junction at 37 cm, gastric "pouch" from 37 to 45 cm, mild gastritis  PROCEDURE:   Procedure(s): ESOPHAGOGASTRODUODENOSCOPY (EGD) and biopsy of stomach for CLO. [photos in chart]  SURGEON:   Alphonsa Overall, M.D.  ASSISTANT:   None  ANESTHESIA:   IV sedation  * No anesthesia staff entered *  Moderate Sedation  75 mcgm Fentanyl, 5 mg Versed  INDICATIONS FOR PROCEDURE:  MERIAH BETTINGER is a 66 y.o. (DOB: [birth year]) white female whose primary care physician is Marylene Land, MD and comes for evaluation of gastric pouch for possible gastric surgery.   The indications and risks of the surgery were explained to the patient.  The risks include, but are not limited to, infection, bleeding, and nerve injury.  Note dictated to:   [id number]

## 2011-08-03 NOTE — Discharge Instructions (Addendum)
Endoscopy Care After Please read the instructions outlined below and refer to this sheet in the next few weeks. These discharge instructions provide you with general information on caring for yourself after you leave the hospital. Your doctor may also give you specific instructions. While your treatment has been planned according to the most current medical practices available, unavoidable complications occasionally occur. If you have any problems or questions after discharge, please call your doctor. HOME CARE INSTRUCTIONS Activity  You may resume your regular activity but move at a slower pace for the next 24 hours.   Take frequent rest periods for the next 24 hours.   Walking will help expel (get rid of) the air and reduce the bloated feeling in your abdomen.   No driving for 24 hours (because of the anesthesia (medicine) used during the test).   You may shower.   Do not sign any important legal documents or operate any machinery for 24 hours (because of the anesthesia used during the test).  Nutrition  Drink plenty of fluids.   You may resume your normal diet.   Begin with a light meal and progress to your normal diet.   Avoid alcoholic beverages for 24 hours or as instructed by your caregiver.  Medications You may resume your normal medications unless your caregiver tells you otherwise. What you can expect today  You may experience abdominal discomfort such as a feeling of fullness or "gas" pains.   You may experience a sore throat for 2 to 3 days. This is normal. Gargling with salt water may help this.  Follow-up Your doctor will discuss the results of your test with you. SEEK IMMEDIATE MEDICAL CARE IF:  You have excessive nausea (feeling sick to your stomach) and/or vomiting.   You have severe abdominal pain and distention (swelling).   You have trouble swallowing.   You have a temperature over 100 F (37.8 C).   You have rectal bleeding or vomiting of blood.    Document Released: 09/02/2003 Document Revised: 01/07/2011 Document Reviewed: 03/15/2007 Exit[employer] Patient Information 2012 [employer]. 

## 2011-08-04 ENCOUNTER — Encounter (HOSPITAL_COMMUNITY): Payer: Self-pay | Admitting: Surgery

## 2011-08-04 LAB — CLOTEST (H. PYLORI), BIOPSY: Helicobacter screen: NEGATIVE

## 2011-08-04 NOTE — Op Note (Signed)
[name] [name], FREIMARK NO.:  [id number]  MEDICAL RECORD NO.:  [id number]  LOCATION:  [facility]                         FACILITY:  [facility]  PHYSICIAN:  Fenton Malling. [name], M.D.  DATE OF BIRTH:  [birth year]  DATE OF PROCEDURE:  08/03/2011                              OPERATIVE REPORT  PREOPERATIVE DIAGNOSES:  Recurrent vomiting and evaluation of anatomy for possible bariatric surgery.  POSTOPERATIVE DIAGNOSES:  Esophagogastric junction at 37 cm, gastric "pouch" 37-45 cm, and mild distal gastritis.  PROCEDURE:  Esophagogastroduodenoscopy with biopsy of stomach wall for CLO test.  SURGEON:  Fenton Malling. [name], MD  ANESTHESIA:  IV sedation with 75 mcg of fentanyl and 5 mg of Versed.  COMPLICATIONS:  None.  INDICATION FOR PROCEDURE:  [name] is a 66 year old white female, who sees Dr. Derinda [name] as her primary care doctor.  She has seen Dr. Johnathan [name] for discussion of possible bariatric surgery.  She had undergone a gastroplasty by Dr. Janann [name] in 1984 at [facility] but failed to lose significant weight.  I am doing an upper endoscopy to evaluate her anatomy for potential evaluation for bariatric surgery.  The indication, potential complications of upper endoscopy were explained to the patient.  Risks include bleeding and perforation.  She actually had Dr. Delfin [name] [name] an upper endo on her about 4 years ago for evaluation of persistent nausea and vomiting.  Despite her persistent history of vomiting almost daily, she has no significant weight loss.  PROCEDURAL NOTE:  The patient was placed in room #3 at [facility].  She was monitored with a blood pressure, EKG, and pulse oximeter.  She had 2 L of nasal O2 flowing.    A time-out was held and a surgical checklist run.  I anesthetized the back of her throat with Cetacaine.  She was given 75 mcgm of Fentanyl and 5 mg of versed.  I passed a flexible Pentax endoscope down the back of  her throat without difficulty.  She was fairly anxious about the procedure, but actually with the sedation onboard, she did very good from a patient's standpoint.  I was able to advance the scope into the stomach pouch and passed the pylorus into the duodenum.  Her duodenum was unremarkable.  Her pylorus was unremarkable.  Her antrum had some mild gastritis.  I did do biopsies in that area for CLO.    I then retroflexed the scope and what she has was a prominent septum that goes about 8 cm below her [place].  I am presuming this is her old gastroplasty surgery line. There was no breakdown of this, but there was also no restriction to any food that goes into her created pouch.  I retroflexed the scope and looked at the cardia which was unremarkable.  I took photos, they were placed in the chart.  This gastric "pouch" goes from about 37 cm to about 45 cm for 8-cm pouch, but basically it just a tube that has no obvious distal resistance.  Her GE junction is unremarkable for woman who vomits daily.  She has no evidence of esophagitis.  She may have little bit of edema  of her cords.    Scope was withdrawn.  I reviewed the findings with the patient and her husband and get back in touch with Dr. Hassell [name].   Fenton Malling. [name], M.D., FACS   DHN/MEDQ  D:  08/03/2011  T:  08/03/2011  Job:  [id number]  cc:   [name] [name], [place] [address], [address] [place] [address]  [name] [name], M.D. Fax: [phone]

## 2011-08-16 ENCOUNTER — Other Ambulatory Visit: Payer: Self-pay | Admitting: Family Medicine

## 2011-08-16 ENCOUNTER — Ambulatory Visit
Admission: RE | Admit: 2011-08-16 | Discharge: 2011-08-16 | Disposition: A | Discharge status: Home / Self Care | Payer: 59 | Source: Ambulatory Visit | Attending: Family Medicine | Admitting: Family Medicine

## 2011-08-16 DIAGNOSIS — T1490XA Injury, unspecified, initial encounter: Secondary | ICD-10-CM

## 2011-08-17 ENCOUNTER — Encounter: Payer: Self-pay | Admitting: Vascular Surgery

## 2011-08-18 ENCOUNTER — Other Ambulatory Visit (INDEPENDENT_AMBULATORY_CARE_PROVIDER_SITE_OTHER): Payer: 59 | Admitting: *Deleted

## 2011-08-18 ENCOUNTER — Ambulatory Visit (INDEPENDENT_AMBULATORY_CARE_PROVIDER_SITE_OTHER): Payer: 59 | Admitting: Vascular Surgery

## 2011-08-18 ENCOUNTER — Encounter: Payer: Self-pay | Admitting: Vascular Surgery

## 2011-08-18 VITALS — BP 188/77 | HR 66 | Temp 98.0°F | Resp 12 | Ht 62.5 in | Wt 258.0 lb

## 2011-08-18 DIAGNOSIS — I6529 Occlusion and stenosis of unspecified carotid artery: Secondary | ICD-10-CM

## 2011-08-18 NOTE — Assessment & Plan Note (Signed)
This patient has a moderate left carotid stenosis with ulceration noted on previous cerebral arteriogram. He is having episodes of expressive aphasia but this is felt to be related most likely to migraines as noted by Dr. Floyde Parkins. She is on aspirin and Plavix. Given that she's continued to have symptoms I have asked her to follow up with Dr. Jannifer Franklin to be sure that no adjustments in her medications need to be made or to possibly reconsider the need for left carotid endarterectomy. I'll plan on seeing her back in 6 months otherwise I have ordered a follow up carotid duplex scan for that time. She knows to call sooner if she has problems. She also knows to continue taking her aspirin and Plavix.

## 2011-08-18 NOTE — Progress Notes (Signed)
[facility] [place]  Patient name: [name] [name] [name]: [id number] DOB: 12/14/1945 Sex: female  REASON FOR VISIT: follow up of left carotid stenosis/ulceration  HPI: [name] [name] is a 66 y.o. female who I originally saw in consultation in February of 2013 with a left carotid stenosis. She had been referred by Dr. Addison Lank. She had been having episodes of expressive 8 aphasia which generally lasted for 2 minutes to 30 minutes. Her carotid duplex scan suggested a lower end 60-79% left carotid stenosis. Her MRA had also suggested some possible intracranial disease on the left. She underwent a cerebral arteriogram which demonstrated an approximate 50% stenosis in the proximal left internal carotid artery. However, this plaque was ulcerated with a moderately deep ulcer noted on the lateral projection. Based on this information I have recommended left carotid endarterectomy. She was subsequently evaluated by Dr. Floyde Parkins and he felt strongly that these symptoms were likely related to migraines. Therefore, her surgery was canceled.  He comes in for a 6 month follow up visit. She's had no focal weakness or paresthesias. She continues to have intermittent episodes of expressive aphasia which is no different than her symptoms before. She is on aspirin and Plavix.  Past Medical History  Diagnosis Date  . Migraine   . Osteoarthritis   . Vomiting     persistent  . Asthma   . Ventral hernia     hx of  . Diabetes mellitus   . GERD (gastroesophageal reflux disease)   . Obesity 01-2010  . Hyperlipidemia   . Hypertension   . H/O: hysterectomy   . Irregular heart beat   . Myocardial infarction 2011    most recent 2011  . Coronary artery disease   . Heart murmur   . Shortness of breath   . Obstructive sleep apnea     no cpap- sleep study 5 yrs ago  . Anxiety   . PONV (postoperative nausea and vomiting)   . Peripheral vascular disease     ? numbness, tingling arms and  legs  . Anemia     hx  . H/O hiatal hernia   . Neuromuscular disorder     ?  Marland Kitchen Dysrhythmia   . Mental disorder     hx of confusion  . Chronic kidney disease     frequency    Family History  Problem Relation Age of Onset  . Heart disease Father     Heart Disease before age 47  . Diabetes Father   . Hyperlipidemia Father   . Hypertension Father   . Heart attack Father   . Diabetes      family hx of paternal grandmother, aunt and grandfather- uncles  . Hypertension Mother   . Cancer Sister     SOCIAL HISTORY: History  Substance Use Topics  . Smoking status: Never Smoker   . Smokeless tobacco: Never Used  . Alcohol Use: No    Allergies  Allergen Reactions  . Amoxicillin Shortness Of Breath and Rash  . Erythromycin Other (See Comments)    Trouble swallowing  . Penicillins Shortness Of Breath and Rash  . Metformin Nausea Only and Other (See Comments)     dizzy    Current Outpatient Prescriptions  Medication Sig Dispense Refill  . albuterol (PROVENTIL) (2.5 MG/3ML) 0.083% nebulizer solution Take 2.5 mg by nebulization every 6 (six) hours as needed. For shortness of breath.      Marland Kitchen albuterol (PROVENTIL,VENTOLIN) 90 MCG/ACT inhaler Inhale  2 puffs into the lungs every 6 (six) hours as needed. For shortness of breath.      . ALPRAZolam (XANAX) 0.5 MG tablet Take 0.5 mg by mouth 3 (three) times daily as needed. For anxiety - Patient takes 1 in the morning and 1 in the evening daily. If needed she may take one in the afternoon.      Marland Kitchen APAP-Isometheptene-Dichloral 325-65-100 MG per capsule Take 1-2 capsules by mouth every 1 hour x 2 doses. As needed for migraines. Take 2 capsules at migraine onset and if no relief within 45 minutes to 1 hour take 1 additional capsule.      Marland Kitchen aspirin EC 81 MG tablet Take 81 mg by mouth daily.      . B Complex-C (B-COMPLEX WITH VITAMIN C) tablet Take 1 tablet by mouth daily.        . Calcium 1500 MG tablet Take 1,500 mg by mouth daily.       .  cephALEXin (KEFLEX) 500 MG capsule Take 500 mg by mouth 4 (four) times daily.      . Cholecalciferol (VITAMIN D3) 1000 UNITS CAPS Take 1,000 Units by mouth daily.       . clopidogrel (PLAVIX) 75 MG tablet Take 75 mg by mouth daily.      . ferrous sulfate 325 (65 FE) MG tablet Take 325 mg by mouth daily with breakfast.      . fluticasone (FLOVENT HFA) 110 MCG/ACT inhaler Inhale 2 puffs into the lungs 2 (two) times daily as needed. As needed for asthma.      . Fluticasone-Salmeterol (ADVAIR DISKUS) 250-50 MCG/DOSE AEPB Inhale 1 puff into the lungs every 12 (twelve) hours as needed. For asthma symptoms      . furosemide (LASIX) 40 MG tablet Take 40 mg by mouth 2 (two) times daily.       Marland Kitchen gabapentin (NEURONTIN) 600 MG tablet Take 600 mg by mouth at bedtime.       . hydrALAZINE (APRESOLINE) 50 MG tablet Take 50 mg by mouth 3 (three) times daily. Does not take dose if BP low      . hydrocodone-acetaminophen (LORCET-HD) 5-500 MG per capsule Take 1 capsule by mouth every 6 (six) hours as needed. As needed for pain.      Marland Kitchen insulin glargine (LANTUS) 100 UNIT/ML injection Inject 65 Units into the skin daily before breakfast.       . losartan (COZAAR) 100 MG tablet Take 100 mg by mouth daily.        . Meclizine HCl (BONINE) 25 MG CHEW Chew by mouth as needed.      . metoprolol (LOPRESSOR) 100 MG tablet Take 1 tablet (100 mg total) by mouth 2 (two) times daily.  60 tablet  6  . Multiple Vitamin (MULTIVITAMIN) tablet Take 1 tablet by mouth daily.        . mupirocin ointment (BACTROBAN) 2 % Apply 1 application topically 3 (three) times daily.      . niacin (NIASPAN) 500 MG CR tablet Take 500 mg by mouth 2 (two) times daily.      Marland Kitchen omeprazole (PRILOSEC) 20 MG capsule Take 20 mg by mouth daily.      . ondansetron (ZOFRAN) 4 MG tablet Take 4 mg by mouth every 8 (eight) hours as needed. For nausea       . ranitidine (ZANTAC) 150 MG tablet Take 150 mg by mouth 2 (two) times daily.        . rosuvastatin (  CRESTOR)  40 MG tablet Take 20 mg by mouth daily.       Marland Kitchen venlafaxine (EFFEXOR) 37.5 MG tablet Take 37.5 mg by mouth 3 (three) times daily.      Marland Kitchen zolpidem (AMBIEN) 10 MG tablet Take 10 mg by mouth at bedtime as needed. For sleep.      Marland Kitchen DISCONTD: venlafaxine XR (EFFEXOR-XR) 150 MG 24 hr capsule Take 97.5 mg by mouth daily.      Marland Kitchen topiramate (TOPAMAX) 25 MG tablet Take 25 mg by mouth daily.        REVIEW OF SYSTEMS: Valu.Nieves ] denotes positive finding; [  ] denotes negative finding  CARDIOVASCULAR:  [ ]  chest pain   [ ]  chest pressure   [ ]  palpitations   [ ]  orthopnea   Valu.Nieves ] dyspnea on exertion   [ ]  claudication   [ ]  rest pain   [ ]  DVT   [ ]  phlebitis PULMONARY:   [ ]  productive cough   [ ]  asthma   [ ]  wheezing NEUROLOGIC:   [ ]  weakness  [ ]  paresthesias  Valu.Nieves ] aphasia  [ ]  amaurosis  [ ]  dizziness HEMATOLOGIC:   [ ]  bleeding problems   [ ]  clotting disorders MUSCULOSKELETAL:  Valu.Nieves ] joint pain   [ ]  joint swelling [ ]  leg swelling GASTROINTESTINAL: [ ]   blood in stool  [ ]   hematemesis GENITOURINARY:  [ ]   dysuria  [ ]   hematuria PSYCHIATRIC:  [ ]  history of major depression INTEGUMENTARY:  [ ]  rashes  [ ]  ulcers CONSTITUTIONAL:  [ ]  fever   [ ]  chills  PHYSICAL EXAM: Filed Vitals:   08/18/11 1500 08/18/11 1503  BP: 188/82 188/77  Pulse: 73 66  Temp: 98 F (36.7 C)   TempSrc: Oral   Resp:  12  Height: 5' 2.5" (1.588 m)   Weight: 258 lb (117.028 kg)   SpO2: 98%    Body mass index is 46.44 kg/(m^2). GENERAL: The patient is a well-nourished female, in no acute distress. The vital signs are documented above. CARDIOVASCULAR: There is a regular rate and rhythm without significant murmur appreciated. I do not detect carotid bruits. PULMONARY: There is good air exchange bilaterally without wheezing or rales. ABDOMEN: Soft and non-tender with normal pitched bowel sounds.  MUSCULOSKELETAL: There are no major deformities or cyanosis. NEUROLOGIC: No focal weakness or paresthesias are detected. SKIN:  There are no ulcers or rashes noted. PSYCHIATRIC: The patient has a normal affect.  DATA:  I have independently interpreted her carotid duplex scan which shows a 60-79% left carotid stenosis in the lower end of that range. There is no significant carotid stenosis on the right. Vertebral arteries have antegrade flow.  MEDICAL ISSUES:  Occlusion and stenosis of carotid artery without mention of cerebral infarction This patient has a moderate left carotid stenosis with ulceration noted on previous cerebral arteriogram. He is having episodes of expressive aphasia but this is felt to be related most likely to migraines as noted by Dr. Floyde Parkins. She is on aspirin and Plavix. Given that she's continued to have symptoms I have asked her to follow up with Dr. Jannifer Franklin to be sure that no adjustments in her medications need to be made or to possibly reconsider the need for left carotid endarterectomy. I'll plan on seeing her back in 6 months otherwise I have ordered a follow up carotid duplex scan for that time. She knows to call sooner if she has problems.  She also knows to continue taking her aspirin and Plavix.   [facility] [place] Beeper: [phone]

## 2011-08-18 NOTE — Addendum Note (Signed)
Addended by: Mena Goes on: 08/18/2011 03:53 PM   Modules accepted: Orders

## 2011-08-26 NOTE — Procedures (Unsigned)
CAROTID DUPLEX EXAM  INDICATION:  Follow up carotid artery disease.  HISTORY: Diabetes:  Migraines. Cardiac:  Yes.  Stents, MI, 5 CABG. Hypertension:  Yes. Smoking:  No. Previous Surgery:  CABG x5. CV History:  Dizziness, expressive aphasia, abnormal gait. Amaurosis Fugax , Paresthesias , Hemiparesis                                      RIGHT             LEFT Brachial systolic pressure:         180               194 Brachial Doppler waveforms:         Triphasic         Triphasic Vertebral direction of flow:        Antegrade         Antegrade DUPLEX VELOCITIES (cm/sec) CCA peak systolic                   85                80 ECA peak systolic                   296               [id number] ICA peak systolic                   109 (mid)         [id number] ICA end diastolic                   31                75 PLAQUE MORPHOLOGY:                  Heterogenous      Heterogenous, irregular, question ulcer PLAQUE AMOUNT:                      Mild              Moderate, irregular PLAQUE LOCATION:                    Bifurcation/ICA   Bifurcation, ICA, ECA, CCA  IMPRESSION: 1. 1%  to 39% right internal carotid artery stenosis. 2. Right external carotid artery stenosis. 3. 60% to 79% left internal carotid artery stenosis. 4. Vertebral artery flow antegrade bilaterally.  ___________________________________________ Judeth Cornfield. Scot Dock, M.D.  SS/MEDQ  D:  08/18/2011  T:  08/18/2011  Job:  [id number]

## 2011-08-27 ENCOUNTER — Ambulatory Visit (INDEPENDENT_AMBULATORY_CARE_PROVIDER_SITE_OTHER): Payer: 59 | Admitting: Surgery

## 2011-08-27 ENCOUNTER — Other Ambulatory Visit (INDEPENDENT_AMBULATORY_CARE_PROVIDER_SITE_OTHER): Payer: Self-pay | Admitting: General Surgery

## 2011-08-27 ENCOUNTER — Encounter (INDEPENDENT_AMBULATORY_CARE_PROVIDER_SITE_OTHER): Payer: Self-pay | Admitting: Surgery

## 2011-08-27 VITALS — BP 152/84 | HR 56 | Temp 97.8°F | Resp 16 | Ht 66.0 in | Wt 260.5 lb

## 2011-08-27 DIAGNOSIS — Z9889 Other specified postprocedural states: Secondary | ICD-10-CM

## 2011-08-27 NOTE — Patient Instructions (Signed)
Your endoscopy biopsy showed: Helicobacter screen-NEGATIVE  See Clarise Cruz in Dietary for weight loss

## 2011-08-27 NOTE — Progress Notes (Signed)
I discussed [name] [name] with Dr. [name] and reviewed her endoscopy report. She has had a gastroplasty with most of the staple line intact. Revision could be contemplated but a sleeve may be contraindicated because of her reflux, a stapled pouch such as a gastric bypass might be complicated by her pre-existing staple line, and adding a band to her gastroplasty again is having a restrictive procedure to another older restrictive procedure.  Her current workup however I think trumps this. She may have significant extracranial vascular disease followed by [name]. She has a family history of progressive strokes in her grandmother and she says this is the first the year she has not been right. I am going to defer to Dr. Doren Custard and Dr. Floyde Parkins in the workup and management of this and will delay any further surgical intervention for now. I would however like for her to have a dietary consult to see if we can use her existing gastroplasty to help her lose weight. We will go in and make that arrangement I will see her back in 3 months

## 2011-09-06 ENCOUNTER — Encounter: Payer: Self-pay | Admitting: Cardiology

## 2011-09-06 ENCOUNTER — Ambulatory Visit (INDEPENDENT_AMBULATORY_CARE_PROVIDER_SITE_OTHER): Payer: 59 | Admitting: Cardiology

## 2011-09-06 VITALS — BP 166/96 | HR 78 | Ht 66.0 in | Wt 262.0 lb

## 2011-09-06 DIAGNOSIS — I2510 Atherosclerotic heart disease of native coronary artery without angina pectoris: Secondary | ICD-10-CM

## 2011-09-06 DIAGNOSIS — R479 Unspecified speech disturbances: Secondary | ICD-10-CM

## 2011-09-06 DIAGNOSIS — R4789 Other speech disturbances: Secondary | ICD-10-CM

## 2011-09-06 DIAGNOSIS — E785 Hyperlipidemia, unspecified: Secondary | ICD-10-CM

## 2011-09-06 DIAGNOSIS — I10 Essential (primary) hypertension: Secondary | ICD-10-CM

## 2011-09-06 DIAGNOSIS — G4733 Obstructive sleep apnea (adult) (pediatric): Secondary | ICD-10-CM

## 2011-09-06 NOTE — Progress Notes (Signed)
HPI:  The patient is in for followup. She's had several ongoing issues. There's been a bit vague about whether she has migraines or TIAs, and carotid surgery is been put on hold. She's also had persistent hypertension, and I received a letter from Dr. Lorrene Reid regarding possibly referring her for renal denervation therapy. Patient is also been seen by the general surgeons. She's had prior gastric bypass, and this was done many years ago. There still is a concern that maybe her problems may be responsible to 2 absorption issues.  Her blood pressure was well controlled after her bypass surgery, but she lost approximately 30 pounds in the hospital. She is she's regained that weight in the interim. Chest pain has not been a major issue. When she has these spells, she says that she has difficulty with speech and both arms are weak. I believe the neurologists have felt that her symptoms were likely from migraine equivalents, and not likely from symptomatic carotid disease. She's been followed closely by Dr. Scot Dock in the vascular surgery clinic and they're repeating her studies again in a few months. Carotid endarterectomy has been held off at this point in time.     Current Outpatient Prescriptions  Medication Sig Dispense Refill  . albuterol (PROVENTIL) (2.5 MG/3ML) 0.083% nebulizer solution Take 2.5 mg by nebulization every 6 (six) hours as needed. For shortness of breath.      Marland Kitchen albuterol (PROVENTIL,VENTOLIN) 90 MCG/ACT inhaler Inhale 2 puffs into the lungs every 6 (six) hours as needed. For shortness of breath.      . ALPRAZolam (XANAX) 0.5 MG tablet Take 0.5 mg by mouth 3 (three) times daily as needed. For anxiety - Patient takes 1 in the morning and 1 in the evening daily. If needed she may take one in the afternoon.      Marland Kitchen APAP-Isometheptene-Dichloral 325-65-100 MG per capsule Take 1-2 capsules by mouth every 1 hour x 2 doses. As needed for migraines. Take 2 capsules at migraine onset and if no relief  within 45 minutes to 1 hour take 1 additional capsule.      Marland Kitchen aspirin EC 81 MG tablet Take 81 mg by mouth daily.      . B Complex-C (B-COMPLEX WITH VITAMIN C) tablet Take 1 tablet by mouth daily.        . Calcium 1500 MG tablet Take 1,500 mg by mouth daily.       . cephALEXin (KEFLEX) 500 MG capsule Take 500 mg by mouth 4 (four) times daily.      . Cholecalciferol (VITAMIN D3) 1000 UNITS CAPS Take 1,000 Units by mouth daily.       . clopidogrel (PLAVIX) 75 MG tablet Take 75 mg by mouth daily.      . ferrous sulfate 325 (65 FE) MG tablet Take 325 mg by mouth daily with breakfast.      . fluticasone (FLOVENT HFA) 110 MCG/ACT inhaler Inhale 2 puffs into the lungs 2 (two) times daily as needed. As needed for asthma.      . Fluticasone-Salmeterol (ADVAIR DISKUS) 250-50 MCG/DOSE AEPB Inhale 1 puff into the lungs every 12 (twelve) hours as needed. For asthma symptoms      . furosemide (LASIX) 40 MG tablet Take 40 mg by mouth 2 (two) times daily.       Marland Kitchen gabapentin (NEURONTIN) 600 MG tablet Take 600 mg by mouth at bedtime.       . hydrALAZINE (APRESOLINE) 50 MG tablet Take 50 mg by mouth  3 (three) times daily. Does not take dose if BP low      . hydrocodone-acetaminophen (LORCET-HD) 5-500 MG per capsule Take 1 capsule by mouth every 6 (six) hours as needed. As needed for pain.      Marland Kitchen insulin glargine (LANTUS) 100 UNIT/ML injection Inject 65 Units into the skin daily before breakfast.       . losartan (COZAAR) 100 MG tablet Take 100 mg by mouth daily.        . Meclizine HCl (BONINE) 25 MG CHEW Chew by mouth as needed.      . metoprolol (LOPRESSOR) 100 MG tablet Take 1 tablet (100 mg total) by mouth 2 (two) times daily.  60 tablet  6  . Multiple Vitamin (MULTIVITAMIN) tablet Take 1 tablet by mouth daily.        . mupirocin ointment (BACTROBAN) 2 % Apply 1 application topically 3 (three) times daily.      . niacin (NIASPAN) 500 MG CR tablet Take 500 mg by mouth 2 (two) times daily.      Marland Kitchen omeprazole  (PRILOSEC) 20 MG capsule Take 20 mg by mouth daily.      . ondansetron (ZOFRAN) 4 MG tablet Take 4 mg by mouth every 8 (eight) hours as needed. For nausea       . ranitidine (ZANTAC) 150 MG tablet Take 150 mg by mouth 2 (two) times daily.        . rosuvastatin (CRESTOR) 40 MG tablet Take 20 mg by mouth daily.       Marland Kitchen topiramate (TOPAMAX) 25 MG tablet Take 25 mg by mouth daily.      Marland Kitchen venlafaxine (EFFEXOR) 37.5 MG tablet Take 37.5 mg by mouth 3 (three) times daily.      Marland Kitchen zolpidem (AMBIEN) 10 MG tablet Take 10 mg by mouth at bedtime as needed. For sleep.        Allergies  Allergen Reactions  . Amoxicillin Shortness Of Breath and Rash  . Erythromycin Other (See Comments)    Trouble swallowing  . Penicillins Shortness Of Breath and Rash  . Metformin Nausea Only and Other (See Comments)     dizzy    Past Medical History  Diagnosis Date  . Migraine   . Osteoarthritis   . Vomiting     persistent  . Asthma   . Ventral hernia     hx of  . Diabetes mellitus   . GERD (gastroesophageal reflux disease)   . Obesity 01-2010  . Hyperlipidemia   . Hypertension   . H/O: hysterectomy   . Irregular heart beat   . Myocardial infarction 2011    most recent 2011  . Coronary artery disease   . Heart murmur   . Shortness of breath   . Obstructive sleep apnea     no cpap- sleep study 5 yrs ago  . Anxiety   . PONV (postoperative nausea and vomiting)   . Peripheral vascular disease     ? numbness, tingling arms and legs  . Anemia     hx  . H/O hiatal hernia   . Neuromuscular disorder     ?  Marland Kitchen Dysrhythmia   . Mental disorder     hx of confusion  . Chronic kidney disease     frequency    Past Surgical History  Procedure Date  . Tumor removal     ovarian  . Leg surgery     right  . Hernia repair   . Angioplasty  multiple times  . Cholecysterc   . Cholecystectomy   . Gastric stapling 1970  . Abdominal hysterectomy   . Ankle fracture surgery 70's    lft   . Cardiac  catheterization ,01,02,05,06,07,08    stents 02  . Fracture surgery   . Appendectomy   . Coronary artery bypass graft 12/11  . Coronary angioplasty     stents  . Esophagogastroduodenoscopy 08/03/2011    Procedure: ESOPHAGOGASTRODUODENOSCOPY (EGD);  Surgeon: Shann Medal, MD;  Location: [facility];  Service: General;  Laterality: N/A;    Family History  Problem Relation Age of Onset  . Heart disease Father     Heart Disease before age 66  . Diabetes Father   . Hyperlipidemia Father   . Hypertension Father   . Heart attack Father   . Diabetes      family hx of paternal grandmother, aunt and grandfather- uncles  . Hypertension Mother   . Cancer Sister     History   Social History  . Marital Status: Married    Spouse Name: N/A    Number of Children: N/A  . Years of Education: N/A   Occupational History  . Not on file.   Social History Main Topics  . Smoking status: Never Smoker   . Smokeless tobacco: Never Used  . Alcohol Use: No  . Drug Use: No  . Sexually Active: Not on file     hysterectomy   Other Topics Concern  . Not on file   Social History Narrative  . No narrative on file    ROS: Please see the HPI.  All other systems reviewed and negative.  PHYSICAL EXAM:  BP 166/96  Pulse 78  Ht 5\' 6"  (1.676 m)  Wt 262 lb (118.842 kg)  BMI 42.29 kg/m2  General: Well developed, well nourished, somewhat tearful and frustrated patient in no distress today.   Head:  Normocephalic and atraumatic. Neck: no JVD Lungs: Clear to auscultation and percussion. Heart: Normal S1 and S2.  Paradoxical S2.   Abdomen:  Normal bowel sounds; soft; obese Pulses: Pulses normal in all 4 extremities. Extremities: No clubbing or cyanosis. No edema. Neurologic: Alert and oriented x 3.  EKG:  NSR.  LBBB.    ASSESSMENT AND PLAN:

## 2011-09-06 NOTE — Patient Instructions (Signed)
Your physician recommends that you schedule a follow-up appointment in: 3 MONTHS  Your physician recommends that you continue on your current medications as directed. Please refer to the Current Medication list given to you today.   

## 2011-09-20 ENCOUNTER — Encounter: Payer: 59 | Attending: Family Medicine | Admitting: *Deleted

## 2011-09-20 ENCOUNTER — Encounter: Payer: Self-pay | Admitting: *Deleted

## 2011-09-20 DIAGNOSIS — Z9884 Bariatric surgery status: Secondary | ICD-10-CM | POA: Insufficient documentation

## 2011-09-20 DIAGNOSIS — Z713 Dietary counseling and surveillance: Secondary | ICD-10-CM | POA: Insufficient documentation

## 2011-09-20 DIAGNOSIS — Z09 Encounter for follow-up examination after completed treatment for conditions other than malignant neoplasm: Secondary | ICD-10-CM | POA: Insufficient documentation

## 2011-09-20 NOTE — Patient Instructions (Addendum)
Goals:  Follow [facility] Post-[facility] Diet  Eat 3-6 small meals/snacks, every 3-5 hrs -   NO MEAL SKIPPING (Try a shake at breakfast to retrain appetite)  Discontinue carbonated beverages  Increase lean protein foods to meet 60-80g goal  Increase fluid intake to 64oz +  Add 15 grams of carbohydrate (fruit, whole grain, starchy vegetable) with meals  Avoid drinking 15 minutes before, during and 30 minutes after eating  Switch to calcium CITRATE and sublingual B-12.  Keep me posted on the nausea and vomiting; foods you are not tolerating.

## 2011-09-20 NOTE — Assessment & Plan Note (Signed)
Very problematic at present time.  She does not fit well in any of the trials for renal denervation, but likely would be a good candidate.  I will try to investigate the options.  She is being managed by the neprhology and hypertension clinic nicely by Dr. Lorrene Reid, and has been previously to the hypertension clinic at [facility].  I am not sure what to add, although hydralazine could be increased some more for better control.

## 2011-09-20 NOTE — Progress Notes (Addendum)
Follow-up visit:  21 Years Post-Operative RYGB Surgery  Medical Nutrition Therapy:  Appt start time: 0945 end time:  1115.  Primary concerns today: Post-operative Bariatric Surgery Nutrition Management. [name] [name] comes in today 21 years s/p RYGB. States she had no nutrition f/u after surgery and was not told to avoid carbonated drinks, caffeine, starchy foods, etc. Has started experiencing N/V over last few years when eating various foods; no trend in food type noted. ? if r/t long-term diet soda and coffee intake d/t action of acidity and carbonation on stomach lining? She also eats only twice a day and drinks during meals; was unaware that liquid could push food through pouch. [name] does not want to have another surgery and, along with Dr. Hassell Done, would like to use current surgery to attempt weight loss. Plan to have her follow modified post-op diet, keeping protein around 60-70 g d/t past renal issues.   Surgery date: 1984 Surgery type: RYGB Pt reported pre-op start weight: 284.0 lbs Pt reported lowest post-op weight: 220.0 lbs (after heart attack; states only 30 lb loss after RYGB)  Weight today: 261.6 lbs Weight change: n/a Total weight lost: 22.4 lbs BMI: 47.1 kg/m^2  Weight goal: <200 lbs % Weight goal met: 21%  Fluid intake: ~ 45-50 oz (water, diet soda (24 oz), coffee) Estimated total protein intake: < 60g  Medications: See medication list; reconciled with pt Supplementation: Reports 1 MVI daily, 1500 mg calcium carbonate, B-12 PO. Discussed the recommendation of 200% of RDA, and need for calcium citrate/sublingual B-12.    CBG monitoring: 1-2 times/daily Average CBG per patient: 170-178; outlier of 206 Last patient reported A1c: 6.7% (07/2011)  Using straws: Not reported Drinking while eating: Yes Hair loss: No Carbonated beverages: Yes - 2 (12 oz) cans daily N/V/D/C: Diarrhea and N/V with "any food, just depends on the day"; no trends noted in food type Dumping syndrome:  Yes; at any given time with any foods  Recent physical activity:  Water walking and some strength training 3x/week @ 60 min  Progress Towards Goal(s):  In progress.  Handouts given during visit include:  Bariatric Surgery Protein Shakes handout  Bariatric Surgery Specialized Post-Op Diet  Bariatric Surgery Supplement Recommendations  [facility] Price List  Samples given during visit include:   Celebrate Vitamins Sublingual B-12 (500 mcg): 3 ea Lot # [id number]; Exp: 05/15  Renee Pain Protein Powder: 1 ea Choc Splendor: Lot # [id number]; Exp: 12/14 Strawberry Sorbet: Lot # [id number]; Exp: 09/14 Vanilla: Lot # [id number]; Exp: 09/14 Chicken Soup: Lot # [id number]; Exp: 10/14 Unflavored: Lot # [id number]; Exp: 09/14    Nutritional Diagnosis:  NB-1.1 Food and nutrition-related knowledge deficit related to post-op RYGB nutrition guidelines as evidenced by patient-reported intake of starchy foods and carbonated drinks, as well as drinking while eating.    Intervention:  Nutrition education/reinforcement.  Monitoring/Evaluation:  Dietary intake, exercise, and body weight. Follow up in 6 weeks.

## 2011-09-20 NOTE — Assessment & Plan Note (Signed)
Had been set up for CE, but felt by Dr. Jannifer Franklin to have migraines instead of TIA.  They have followed this closely, and he is in contact with Dr. Scot Dock about the options and treatment recommendations.

## 2011-09-20 NOTE — Assessment & Plan Note (Signed)
Might be a key, but cannot and will not use CPAP.

## 2011-09-20 NOTE — Assessment & Plan Note (Signed)
Followed closely by Dr. Sandi Mariscal.

## 2011-09-20 NOTE — Assessment & Plan Note (Signed)
Currently stable, but some more chest pain since she had this happen.  I would be conservative for now.

## 2011-11-01 ENCOUNTER — Ambulatory Visit: Payer: 59 | Admitting: *Deleted

## 2011-11-09 ENCOUNTER — Encounter (INDEPENDENT_AMBULATORY_CARE_PROVIDER_SITE_OTHER): Payer: Self-pay | Admitting: General Surgery

## 2011-11-12 ENCOUNTER — Telehealth (INDEPENDENT_AMBULATORY_CARE_PROVIDER_SITE_OTHER): Payer: Self-pay | Admitting: General Surgery

## 2011-11-12 NOTE — Telephone Encounter (Signed)
Vascular appt moved to 12/08/11 @ 2:00 pm per our request.

## 2011-11-25 ENCOUNTER — Ambulatory Visit (INDEPENDENT_AMBULATORY_CARE_PROVIDER_SITE_OTHER): Payer: 59 | Admitting: Surgery

## 2011-11-29 ENCOUNTER — Encounter (INDEPENDENT_AMBULATORY_CARE_PROVIDER_SITE_OTHER): Payer: Self-pay | Admitting: Surgery

## 2011-11-29 ENCOUNTER — Ambulatory Visit (INDEPENDENT_AMBULATORY_CARE_PROVIDER_SITE_OTHER): Payer: 59 | Admitting: Surgery

## 2011-11-29 DIAGNOSIS — I10 Essential (primary) hypertension: Secondary | ICD-10-CM

## 2011-11-29 DIAGNOSIS — E119 Type 2 diabetes mellitus without complications: Secondary | ICD-10-CM

## 2011-11-29 DIAGNOSIS — Z6841 Body Mass Index (BMI) 40.0 and over, adult: Secondary | ICD-10-CM

## 2011-11-29 NOTE — Progress Notes (Signed)
[name] [name] 66 y.o.  Body mass index is 46.76 kg/(m^2).  Patient Active Problem List  Diagnosis  . DM  . UNSPECIFIED VITAMIN D DEFICIENCY  . HYPERLIPIDEMIA  . Obesity-post failed open gastroplasty 1984   . OBSTRUCTIVE SLEEP APNEA  . HYPERTENSION  . MYOCARDIAL INFARCTION, HX OF  . CORONARY ARTERY DISEASE  . PAPILLARY MUSCLE DYSFUNCTION, NON-RHEUMATIC  . ASTHMA  . GERD  . PERSISTENT VOMITING  . VENTRAL HERNIA  . OSTEOARTHRITIS  . MIGRAINES, HX OF  . Dyspnea  . Speech abnormality  . Bariatric surgery status  . Occlusion and stenosis of carotid artery without mention of cerebral infarction    Allergies  Allergen Reactions  . Amoxicillin Shortness Of Breath and Rash  . Erythromycin Other (See Comments)    Trouble swallowing  . Penicillins Shortness Of Breath and Rash  . Metformin Nausea Only and Other (See Comments)     dizzy    Past Surgical History  Procedure Date  . Tumor removal     ovarian  . Leg surgery     right  . Hernia repair   . Angioplasty     multiple times  . Cholecysterc   . Cholecystectomy   . Gastric stapling 1970  . Abdominal hysterectomy   . Ankle fracture surgery 70's    lft   . Cardiac catheterization ,01,02,05,06,07,08    stents 02  . Fracture surgery   . Appendectomy   . Coronary artery bypass graft 12/11  . Coronary angioplasty     stents  . Esophagogastroduodenoscopy 08/03/2011    Procedure: ESOPHAGOGASTRODUODENOSCOPY (EGD);  Surgeon: Shann Medal, MD;  Location: [facility];  Service: General;  Laterality: N/A;   Marylene Land, MD No diagnosis found.  I had discussed [name] with Dr. [name] after her endoscopy.  She is still getting her carotid evaluated.  She continues to vomit in the morning and spits up quantities of mucous. Dr. [name] did not see any physical obstruction. She does have an intact staple line. I think because of all her previous surgery including bowel resection for incarcerated hernia, gallbladder, as well  as her open gastroplasty that she would be a candidate only for a gastric sleeve. We need to see if her insurance would cover this and I will refer her chart to our bariatric schedulers  for review.  Impression: prior open gastroplasty; will evaluation for sleeve gastrectomy Matt B. Hassell Done, MD, [facility] [phone] beeper [phone]  11/29/2011 12:33 PM

## 2011-12-07 ENCOUNTER — Telehealth (INDEPENDENT_AMBULATORY_CARE_PROVIDER_SITE_OTHER): Payer: Self-pay | Admitting: General Surgery

## 2011-12-07 ENCOUNTER — Encounter: Payer: Self-pay | Admitting: Vascular Surgery

## 2011-12-07 ENCOUNTER — Ambulatory Visit (INDEPENDENT_AMBULATORY_CARE_PROVIDER_SITE_OTHER): Payer: 59 | Admitting: Cardiology

## 2011-12-07 ENCOUNTER — Encounter: Payer: Self-pay | Admitting: Cardiology

## 2011-12-07 VITALS — BP 148/96 | HR 76 | Ht 63.0 in | Wt 259.0 lb

## 2011-12-07 DIAGNOSIS — I10 Essential (primary) hypertension: Secondary | ICD-10-CM

## 2011-12-07 DIAGNOSIS — E785 Hyperlipidemia, unspecified: Secondary | ICD-10-CM

## 2011-12-07 DIAGNOSIS — I2510 Atherosclerotic heart disease of native coronary artery without angina pectoris: Secondary | ICD-10-CM

## 2011-12-07 NOTE — Assessment & Plan Note (Signed)
She is stable at the present time on medical regimen

## 2011-12-07 NOTE — Assessment & Plan Note (Signed)
Currently managed by Dr. Sandi Mariscal

## 2011-12-07 NOTE — Patient Instructions (Addendum)
Your physician has requested that you have an echocardiogram. Echocardiography is a painless test that uses sound waves to create images of your heart. It provides your doctor with information about the size and shape of your heart and how well your heart's chambers and valves are working. This procedure takes approximately one hour. There are no restrictions for this procedure.  Your physician recommends that you schedule a follow-up appointment in: [place] physician recommends that you continue on your current medications as directed. Please refer to the Current Medication list given to you today.

## 2011-12-07 NOTE — Assessment & Plan Note (Signed)
Her blood pressure is difficult to control, but is slightly improved from what it has been. I think she would be an excellent candidate for renal ablation, but this is currently not commercially available.

## 2011-12-07 NOTE — Telephone Encounter (Signed)
Nurse with Dr Mee Hives office calling because they have not received the letter with a signature to move up patient's carotid study. She is scheduled tomorrow and they need the signed note faxed to [phone] prior to that. Please advise.

## 2011-12-07 NOTE — Progress Notes (Addendum)
HPI:  The patient returns today in a followup visit. She is debating whether or not to have gastric surgery. She also has neurologic symptoms such that when she moves her head it brings on some vertiginous-type symptoms. She's not having much in the way of chest pain. Her blood pressure is been slightly improved since her dosage was adjusted by Dr. Lorrene Reid. We talked about various options. She is scheduled to see Dr. Scot Dock tomorrow in the [facility] for followup of her carotid.  Current Outpatient Prescriptions  Medication Sig Dispense Refill  . albuterol (PROVENTIL) (2.5 MG/3ML) 0.083% nebulizer solution Take 2.5 mg by nebulization every 6 (six) hours as needed. For shortness of breath.      Marland Kitchen albuterol (PROVENTIL,VENTOLIN) 90 MCG/ACT inhaler Inhale 2 puffs into the lungs every 6 (six) hours as needed. For shortness of breath.      . ALPRAZolam (XANAX) 0.5 MG tablet Take 0.5 mg by mouth 3 (three) times daily as needed. For anxiety - Patient takes 1 in the morning and 1 in the evening daily. If needed she may take one in the afternoon.      Marland Kitchen APAP-Isometheptene-Dichloral 325-65-100 MG per capsule Take 1-2 capsules by mouth every 1 hour x 2 doses. As needed for migraines. Take 2 capsules at migraine onset and if no relief within 45 minutes to 1 hour take 1 additional capsule.      Marland Kitchen aspirin EC 81 MG tablet Take 81 mg by mouth daily.      . B Complex-C (B-COMPLEX WITH VITAMIN C) tablet Take 1 tablet by mouth daily.        . Calcium 1500 MG tablet Take 1,500 mg by mouth daily.       . cephALEXin (KEFLEX) 500 MG capsule Take 500 mg by mouth 4 (four) times daily.      . Cholecalciferol (VITAMIN D3) 1000 UNITS CAPS Take 1,000 Units by mouth daily.       . clopidogrel (PLAVIX) 75 MG tablet Take 75 mg by mouth daily.      . ferrous sulfate 325 (65 FE) MG tablet Take 325 mg by mouth daily with breakfast.      . fluticasone (FLOVENT HFA) 110 MCG/ACT inhaler Inhale 2 puffs into the lungs 2 (two) times daily  as needed. As needed for asthma.      . Fluticasone-Salmeterol (ADVAIR DISKUS) 250-50 MCG/DOSE AEPB Inhale 1 puff into the lungs every 12 (twelve) hours as needed. For asthma symptoms      . furosemide (LASIX) 40 MG tablet Take 40 mg by mouth 2 (two) times daily.       Marland Kitchen gabapentin (NEURONTIN) 600 MG tablet Take 600 mg by mouth at bedtime.       . hydrALAZINE (APRESOLINE) 50 MG tablet Take 50 mg by mouth 3 (three) times daily. Does not take dose if BP low      . hydrocodone-acetaminophen (LORCET-HD) 5-500 MG per capsule Take 1 capsule by mouth every 6 (six) hours as needed. As needed for pain.      Marland Kitchen insulin glargine (LANTUS) 100 UNIT/ML injection Inject 65 Units into the skin daily before breakfast.       . losartan (COZAAR) 100 MG tablet Take 100 mg by mouth 2 (two) times daily.       . Meclizine HCl (BONINE) 25 MG CHEW Chew 25 mg by mouth every 6 (six) hours as needed.       . metoprolol (LOPRESSOR) 100 MG tablet Take 1  tablet (100 mg total) by mouth 2 (two) times daily.  60 tablet  6  . Multiple Vitamin (MULTIVITAMIN) tablet Take 1 tablet by mouth daily.        . niacin (NIASPAN) 500 MG CR tablet Take 500 mg by mouth 2 (two) times daily.      Marland Kitchen omeprazole (PRILOSEC) 20 MG capsule Take 20 mg by mouth daily.      . ondansetron (ZOFRAN) 4 MG tablet Take 4 mg by mouth every 8 (eight) hours as needed. For nausea       . ranitidine (ZANTAC) 150 MG tablet Take 150 mg by mouth 2 (two) times daily.        . rosuvastatin (CRESTOR) 40 MG tablet Take 20 mg by mouth daily.       Marland Kitchen topiramate (TOPAMAX) 25 MG tablet Take 1 tab 3 times daily      . triamterene-hydrochlorothiazide (MAXZIDE-25) 37.5-25 MG per tablet Use as needed      . venlafaxine (EFFEXOR) 37.5 MG tablet Take 37.5 mg by mouth 3 (three) times daily.      . vitamin B-12 (CYANOCOBALAMIN) 1000 MCG tablet Take 1,000 mcg by mouth daily.      . Vitamin D, Ergocalciferol, (DRISDOL) 50000 UNITS CAPS Take 50,000 Units by mouth every 7 (seven) days.       Marland Kitchen zolpidem (AMBIEN) 10 MG tablet Take 10 mg by mouth at bedtime as needed. For sleep.        Allergies  Allergen Reactions  . Amoxicillin Shortness Of Breath and Rash  . Erythromycin Other (See Comments)    Trouble swallowing  . Penicillins Shortness Of Breath and Rash  . Metformin Nausea Only and Other (See Comments)     dizzy  . Metformin And Related     dizziness and nausea    Past Medical History  Diagnosis Date  . Migraine   . Osteoarthritis   . Vomiting     persistent  . Asthma   . Ventral hernia     hx of  . Diabetes mellitus   . GERD (gastroesophageal reflux disease)   . Obesity 01-2010  . Hyperlipidemia   . Hypertension   . H/O: hysterectomy   . Irregular heart beat   . Myocardial infarction 2011    most recent 2011  . Coronary artery disease   . Heart murmur   . Shortness of breath   . Obstructive sleep apnea     no cpap- sleep study 5 yrs ago  . Anxiety   . PONV (postoperative nausea and vomiting)   . Peripheral vascular disease     ? numbness, tingling arms and legs  . Anemia     hx  . H/O hiatal hernia   . Neuromuscular disorder     ?  Marland Kitchen Dysrhythmia   . Mental disorder     hx of confusion  . Chronic kidney disease     frequency    Past Surgical History  Procedure Date  . Tumor removal     ovarian  . Leg surgery     right  . Hernia repair   . Angioplasty     multiple times  . Cholecysterc   . Cholecystectomy   . Gastric stapling 1970  . Abdominal hysterectomy   . Ankle fracture surgery 70's    lft   . Cardiac catheterization ,01,02,05,06,07,08    stents 02  . Fracture surgery   . Appendectomy   . Coronary artery bypass graft 12/11  .  Coronary angioplasty     stents  . Esophagogastroduodenoscopy 08/03/2011    Procedure: ESOPHAGOGASTRODUODENOSCOPY (EGD);  Surgeon: Shann Medal, MD;  Location: [facility];  Service: General;  Laterality: N/A;    Family History  Problem Relation Age of Onset  . Heart disease Father      Heart Disease before age 52  . Diabetes Father   . Hyperlipidemia Father   . Hypertension Father   . Heart attack Father   . Diabetes      family hx of paternal grandmother, aunt and grandfather- uncles  . Hypertension Mother   . Cancer Sister     History   Social History  . Marital Status: Married    Spouse Name: N/A    Number of Children: N/A  . Years of Education: N/A   Occupational History  . Not on file.   Social History Main Topics  . Smoking status: Never Smoker   . Smokeless tobacco: Never Used  . Alcohol Use: No  . Drug Use: No  . Sexually Active: Not on file     Comment: hysterectomy   Other Topics Concern  . Not on file   Social History Narrative  . No narrative on file    ROS: Please see the HPI.  All other systems reviewed and negative.  PHYSICAL EXAM:  BP 148/96  Pulse 76  Ht 5\' 3"  (1.6 m)  Wt 259 lb (117.482 kg)  BMI 45.88 kg/m2  SpO2 98%  General: Well developed, well nourished, in no acute distress. Head:  Normocephalic and atraumatic. Neck: no JVD Lungs: Clear to auscultation and percussion. Heart: Normal S1.  SEM.  No DM.  Paradoxical S2.   Abdomen:  Normal bowel sounds; soft; non tender; no organomegaly Pulses: Pulses normal in all 4 extremities. Extremities: No clubbing or cyanosis. No edema. Neurologic: Alert and oriented x 3.  EKG:  NSR.  LBBB. IACD.    ASSESSMENT AND PLAN:

## 2011-12-08 ENCOUNTER — Other Ambulatory Visit (INDEPENDENT_AMBULATORY_CARE_PROVIDER_SITE_OTHER): Payer: 59 | Admitting: *Deleted

## 2011-12-08 ENCOUNTER — Ambulatory Visit (INDEPENDENT_AMBULATORY_CARE_PROVIDER_SITE_OTHER): Payer: 59 | Admitting: Vascular Surgery

## 2011-12-08 ENCOUNTER — Encounter: Payer: Self-pay | Admitting: Vascular Surgery

## 2011-12-08 ENCOUNTER — Telehealth (INDEPENDENT_AMBULATORY_CARE_PROVIDER_SITE_OTHER): Payer: Self-pay | Admitting: General Surgery

## 2011-12-08 VITALS — BP 157/92 | HR 74 | Ht 63.0 in | Wt 259.8 lb

## 2011-12-08 DIAGNOSIS — I6529 Occlusion and stenosis of unspecified carotid artery: Secondary | ICD-10-CM

## 2011-12-08 DIAGNOSIS — Z01818 Encounter for other preprocedural examination: Secondary | ICD-10-CM

## 2011-12-08 NOTE — Progress Notes (Signed)
[facility] [place]  Patient name: [name] [name] [name]: [id number] DOB: May 28, 1945 Sex: female  REASON FOR VISIT: Follow up left carotid stenosis  HPI: [name] [name] is a 66 y.o. female who I had originally seen in consultation in February of this year with a left carotid stenosis. She has 60-79% carotid stenosis by duplex and the lower end of that range. She had been having some episodes of what sounded like expressive aphasia. We were considering left carotid endarterectomy. She underwent a cerebral arteriogram which showed that the stenosis was only 50%. However, there was some ulceration noted. After discussing the case with Dr. Floyde Parkins he felt that the symptoms she was having were related to the migraines and we elected not to address the ulceration left carotid artery. She comes in for a routine visit. Of note, she is being considered for revision of a previous gastroplasty and I was asked to address the carotid disease in terms of her perioperative risk.   Saw her last she denies any history of stroke, TIAs, or amaurosis fugax. She continues to have the issue with migraines and aphasia. She is scheduled for an echo tomorrow at Dr. Gershon Mussel Stuckey's office. She is on aspirin and Plavix.  Past Medical History  Diagnosis Date  . Migraine   . Osteoarthritis   . Vomiting     persistent  . Asthma   . Ventral hernia     hx of  . Diabetes mellitus   . GERD (gastroesophageal reflux disease)   . Obesity 01-2010  . Hyperlipidemia   . Hypertension   . H/O: hysterectomy   . Irregular heart beat   . Myocardial infarction 2011    most recent 2011  . Coronary artery disease   . Heart murmur   . Shortness of breath   . Obstructive sleep apnea     no cpap- sleep study 5 yrs ago  . Anxiety   . PONV (postoperative nausea and vomiting)   . Peripheral vascular disease     ? numbness, tingling arms and legs  . Anemia     hx  . H/O hiatal hernia   . Neuromuscular disorder       ?  Marland Kitchen Dysrhythmia   . Mental disorder     hx of confusion  . Chronic kidney disease     frequency    Family History  Problem Relation Age of Onset  . Heart disease Father     Heart Disease before age 81  . Diabetes Father   . Hyperlipidemia Father   . Hypertension Father   . Heart attack Father   . Diabetes      family hx of paternal grandmother, aunt and grandfather- uncles  . Hypertension Mother   . Cancer Sister     SOCIAL HISTORY: History  Substance Use Topics  . Smoking status: Never Smoker   . Smokeless tobacco: Never Used  . Alcohol Use: No    Allergies  Allergen Reactions  . Amoxicillin Shortness Of Breath and Rash  . Erythromycin Other (See Comments)    Trouble swallowing  . Penicillins Shortness Of Breath and Rash  . Metformin Nausea Only and Other (See Comments)     dizzy  . Metformin And Related     dizziness and nausea   REVIEW OF SYSTEMS: Valu.Nieves ] denotes positive finding; [  ] denotes negative finding  CARDIOVASCULAR:  Valu.Nieves ] chest pain   [ ]  chest pressure   Valu.Nieves ] palpitations   [  X ] orthopnea   Valu.Nieves ] dyspnea on exertion   [ ]  claudication   [ ]  rest pain   [ ]  DVT   [ ]  phlebitis PULMONARY:   [ ]  productive cough   Valu.Nieves ] asthma   Valu.Nieves ] wheezing NEUROLOGIC:   [ ]  weakness  [ ]  paresthesias  [ ]  aphasia  [ ]  amaurosis  Valu.Nieves ] dizziness HEMATOLOGIC:   [ ]  bleeding problems   [ ]  clotting disorders MUSCULOSKELETAL:  [ ]  joint pain   [ ]  joint swelling [ ]  leg swelling GASTROINTESTINAL: [ ]   blood in stool  [ ]   hematemesis GENITOURINARY:  [ ]   dysuria  [ ]   hematuria PSYCHIATRIC:  [ ]  history of major depression INTEGUMENTARY:  [ ]  rashes  [ ]  ulcers CONSTITUTIONAL:  [ ]  fever   [ ]  chills  PHYSICAL EXAM: Filed Vitals:   12/08/11 1532 12/08/11 1535  BP: 174/78 157/92  Pulse: 74   Height: 5\' 3"  (1.6 m)   Weight: 259 lb 12.8 oz (117.845 kg)   SpO2: 96%    Body mass index is 46.02 kg/(m^2). GENERAL: The patient is a well-nourished female, in no acute  distress. The vital signs are documented above. CARDIOVASCULAR: There is a regular rate and rhythm. I do not detect carotid bruits. PULMONARY: There is good air exchange bilaterally without wheezing or rales. ABDOMEN: Soft and non-tender with normal pitched bowel sounds.  MUSCULOSKELETAL: There are no major deformities or cyanosis. NEUROLOGIC: No focal weakness or paresthesias are detected. SKIN: There are no ulcers or rashes noted. PSYCHIATRIC: The patient has a normal affect.  DATA:  I have independently interpreted her carotid duplex scan which shows no significant change in the velocities of her left carotid artery. He has a 60-79% left carotid stenosis with no significant stenosis on the right. Vertebral arteries are patent with normally directed flow appeared  MEDICAL ISSUES: As the left carotid stenosis is asymptomatic and is clearly less than 80% I would not recommend consideration for left carotid endarterectomy unless she developed left hemispheric symptoms or the stenosis progressed to greater than 80%. She does know to stay on her aspirin and Plavix. Likewise, I would not recommend carotid endarterectomy prior to planned gastric surgery for the same reason. I think it would be reasonable to hold her Plavix preoperatively if she does undergo surgery, and if it is okay from Dr. [name] standpoint, and then restarted postoperatively. I have scheduled her for a follow carotid duplex scan in 6 months and I'll see her back at that time. She knows to call sooner if she has problems.  [facility] [place] Beeper: [phone]

## 2011-12-08 NOTE — Telephone Encounter (Signed)
Spoke with Rip Harbour from Dr. Nicole Cella office and explained that Dr. Hassell Done will not be in the office until after 2:00.  I informed her that Dr. Hassell Done was a CDS and he has written exactly what he wants done on a Rx note and signed it and is faxing it to them now.  She said this was lovely and they would take care of it from here.

## 2011-12-09 ENCOUNTER — Ambulatory Visit (HOSPITAL_COMMUNITY): Payer: 59 | Attending: Internal Medicine

## 2011-12-09 DIAGNOSIS — E785 Hyperlipidemia, unspecified: Secondary | ICD-10-CM

## 2011-12-09 DIAGNOSIS — I739 Peripheral vascular disease, unspecified: Secondary | ICD-10-CM | POA: Insufficient documentation

## 2011-12-09 DIAGNOSIS — I10 Essential (primary) hypertension: Secondary | ICD-10-CM | POA: Insufficient documentation

## 2011-12-09 DIAGNOSIS — I059 Rheumatic mitral valve disease, unspecified: Secondary | ICD-10-CM | POA: Insufficient documentation

## 2011-12-09 DIAGNOSIS — I379 Nonrheumatic pulmonary valve disorder, unspecified: Secondary | ICD-10-CM | POA: Insufficient documentation

## 2011-12-09 DIAGNOSIS — I2510 Atherosclerotic heart disease of native coronary artery without angina pectoris: Secondary | ICD-10-CM

## 2011-12-09 DIAGNOSIS — I369 Nonrheumatic tricuspid valve disorder, unspecified: Secondary | ICD-10-CM | POA: Insufficient documentation

## 2011-12-09 DIAGNOSIS — E119 Type 2 diabetes mellitus without complications: Secondary | ICD-10-CM | POA: Insufficient documentation

## 2011-12-09 NOTE — Addendum Note (Signed)
Addended by: Mena Goes on: 12/09/2011 09:18 AM   Modules accepted: Orders

## 2011-12-09 NOTE — Progress Notes (Signed)
Echocardiogram performed.  

## 2012-02-16 ENCOUNTER — Other Ambulatory Visit: Payer: 59

## 2012-02-16 ENCOUNTER — Ambulatory Visit: Payer: 59 | Admitting: Vascular Surgery

## 2012-02-23 ENCOUNTER — Institutional Professional Consult (permissible substitution): Payer: 59 | Admitting: Pulmonary Disease

## 2012-02-28 ENCOUNTER — Other Ambulatory Visit: Payer: Self-pay | Admitting: Family Medicine

## 2012-02-28 DIAGNOSIS — M5412 Radiculopathy, cervical region: Secondary | ICD-10-CM

## 2012-03-09 ENCOUNTER — Other Ambulatory Visit: Payer: Self-pay | Admitting: Pulmonary Disease

## 2012-03-09 ENCOUNTER — Telehealth: Payer: Self-pay | Admitting: Pulmonary Disease

## 2012-03-09 ENCOUNTER — Ambulatory Visit (INDEPENDENT_AMBULATORY_CARE_PROVIDER_SITE_OTHER): Payer: 59 | Admitting: Cardiology

## 2012-03-09 ENCOUNTER — Ambulatory Visit (INDEPENDENT_AMBULATORY_CARE_PROVIDER_SITE_OTHER): Payer: 59 | Admitting: Pulmonary Disease

## 2012-03-09 ENCOUNTER — Encounter: Payer: Self-pay | Admitting: Pulmonary Disease

## 2012-03-09 VITALS — BP 148/78 | HR 73 | Ht 62.5 in | Wt 253.0 lb

## 2012-03-09 VITALS — BP 170/92 | HR 81 | Temp 97.6°F | Ht 62.5 in | Wt 262.0 lb

## 2012-03-09 DIAGNOSIS — I6529 Occlusion and stenosis of unspecified carotid artery: Secondary | ICD-10-CM

## 2012-03-09 DIAGNOSIS — E785 Hyperlipidemia, unspecified: Secondary | ICD-10-CM

## 2012-03-09 DIAGNOSIS — G4733 Obstructive sleep apnea (adult) (pediatric): Secondary | ICD-10-CM

## 2012-03-09 DIAGNOSIS — I2510 Atherosclerotic heart disease of native coronary artery without angina pectoris: Secondary | ICD-10-CM

## 2012-03-09 DIAGNOSIS — I10 Essential (primary) hypertension: Secondary | ICD-10-CM

## 2012-03-09 NOTE — Telephone Encounter (Signed)
Order sent to [facility].

## 2012-03-09 NOTE — Patient Instructions (Addendum)
Think about the options of oxygen at night alone (not optimal but may help), dental appliance, and cpap with nasal pillows to help with claustrophobia. Work on [employer] loss Call me next week with your decision and questions.

## 2012-03-09 NOTE — Progress Notes (Signed)
Subjective:    Patient ID: [name] [name], female    DOB: 10-17-1945, 67 y.o.   [name]: [id number]  HPI The patient is a 67 year old female who I've been asked to see for management of obstructive sleep apnea.  She was diagnosed with moderate sleep apnea in the past, and last seen in 2000 and May where she was unable to tolerate CPAP because of claustrophobia issues.  The patient felt very adamant that she could not tolerate.  I did refer her to otolaryngology for upper airway evaluation, however they were concerned because of her need to take Plavix.  The patient did not want to consider a dental appliance at that time.  Her sleep apnea has gone untreated since that time, and now she is having a lot of medical issues that can be impacted by sleep disordered breathing.  Her weight is actually neutral from last visit in 2008.  The patient still snores, but no one has mentioned an abnormal breathing pattern during sleep.  However, she does sleep fairly upright.  She has frequent awakenings at night, and is not rested in the mornings upon arising.  She notes sleep pressure during the day with very quiet activities, but has no issues in the evenings.  She has no sleepiness while driving.  The patient's Epworth score today is 9.  Sleep Questionnaire: What time do you typically go to bed?( Between what hours) 10-11p How long does it take you to fall asleep? 6mins-2hr How many times during the night do you wake up? 5 What time do you get out of bed to start your day? 0730 Do you drive or operate heavy machinery in your occupation? No How much has your weight changed (up or down) over the past two years? (In pounds) 5 lb (2.268 kg) Have you ever had a sleep study before? Yes If yes, location of study? [place] [phone]----moderate/severe OSA If yes, date of study? 07/20/2004 Do you currently use CPAP? No Do you wear oxygen at any time? No    Review of Systems  Constitutional: Negative for fever and  unexpected weight change.  HENT: Positive for congestion, rhinorrhea, sneezing, postnasal drip and sinus pressure. Negative for ear pain, nosebleeds, sore throat, trouble swallowing and dental problem.   Eyes: Negative for redness and itching.  Respiratory: Positive for cough, shortness of breath and wheezing. Negative for chest tightness.   Cardiovascular: Positive for palpitations and leg swelling.  Gastrointestinal: Positive for nausea and vomiting ( about everyday).  Genitourinary: Negative for dysuria.  Musculoskeletal: Positive for joint swelling and arthralgias.       Both knees and first 7 vertebras are crushed  Skin: Negative for rash.  Neurological: Positive for headaches.  Hematological: Does not bruise/bleed easily.  Psychiatric/Behavioral: Positive for dysphoric mood. The patient is not nervous/anxious.        Objective:   Physical Exam Constitutional:  Morbidly obese female, no acute distress  HENT:  Nares patent without discharge, but narrowed.   Oropharynx without exudate, palate and uvula are moderately elongated, minimal tonsils  Eyes:  Perrla, eomi, no scleral icterus  Neck:  No JVD, no TMG  Cardiovascular:  Normal rate, regular rhythm, no rubs or gallops.  3/6 sem        Intact distal pulses  Pulmonary :  Normal breath sounds, no stridor or respiratory distress   No rales, rhonchi, or wheezing  Abdominal:  Soft, nondistended, bowel sounds present.  No tenderness noted.   Musculoskeletal:  mild lower  extremity edema noted.  Lymph Nodes:  No cervical lymphadenopathy noted  Skin:  No cyanosis noted  Neurologic:  Alert, appropriate, moves all 4 extremities without obvious deficit.         Assessment & Plan:

## 2012-03-09 NOTE — Telephone Encounter (Signed)
Returning call can be reached at [phone].[name] [name]

## 2012-03-09 NOTE — Telephone Encounter (Signed)
Called and spoke with pt and she stated that she will try the cpap. She stated that she will [place] pick the home care company.  Thanks.

## 2012-03-09 NOTE — Assessment & Plan Note (Signed)
The patient has a history of moderate obstructive sleep apnea, but has been intolerant to CPAP in the past because of severe claustrophobia.  She is really unsure at this point if she wants to try CPAP again.  We could use nasal pillows along with different pressure delivery systems.  I do think this is her best treatment she is working on weight loss.  I also discussed with her a dental appliance, but she has difficulties with things in her mouth that make her gag.  The last option is nocturnal oxygen in order to prevent the complications of pulmonary hypertension, however this will do nothing for the quality of her sleep or daytime symptoms. The patient is really unsure which way she wants to go, and I passed her to take some time to consider the various options.  She will let us know once she has made her decision.

## 2012-03-09 NOTE — Progress Notes (Signed)
HPI:  This patient returns today in followup. I discussed her recent trial results related to renal radiofrequency ablation. She was a bit disappointed. She seen Dr. Lorrene Reid recently, and her medicines have been adjusted. She also is getting another attempt to consideration of treatment for obstructive sleep apnea, something that she knows that she has. Her biggest complaint seems to be chronic fatigue. She does get some tightness when she goes upstairs. She admits to being worn out all the time. She's not had prolonged or unstable symptoms. We also discussed her results of her recent echocardiogram.   Current Outpatient Prescriptions  Medication Sig Dispense Refill  . albuterol (PROVENTIL) (2.5 MG/3ML) 0.083% nebulizer solution Take 2.5 mg by nebulization every 6 (six) hours as needed. For shortness of breath.      Marland Kitchen albuterol (PROVENTIL,VENTOLIN) 90 MCG/ACT inhaler Inhale 2 puffs into the lungs every 6 (six) hours as needed. For shortness of breath.      . ALPRAZolam (XANAX) 0.5 MG tablet Take 0.5 mg by mouth 3 (three) times daily as needed. For anxiety - Patient takes 1 in the morning and 1 in the evening daily. If needed she may take one in the afternoon.      Marland Kitchen aspirin EC 81 MG tablet Take 81 mg by mouth daily.      Marland Kitchen buPROPion (WELLBUTRIN XL) 150 MG 24 hr tablet Take 150 mg by mouth daily.      . Calcium 1500 MG tablet Take 1,500 mg by mouth daily.       . Cholecalciferol (VITAMIN D3) 1000 UNITS CAPS Take 2,000 Units by mouth daily.       . cloNIDine (CATAPRES) 0.2 MG tablet Take 0.2 mg by mouth 2 (two) times daily.      . clopidogrel (PLAVIX) 75 MG tablet Take 75 mg by mouth daily.      . ferrous sulfate 325 (65 FE) MG tablet Take 325 mg by mouth daily with breakfast.      . FLUoxetine (PROZAC) 20 MG capsule Take 20 mg by mouth daily.      . Fluticasone-Salmeterol (ADVAIR DISKUS) 250-50 MCG/DOSE AEPB Inhale 1 puff into the lungs every 12 (twelve) hours as needed. For asthma symptoms      .  furosemide (LASIX) 40 MG tablet Take 40 mg by mouth 2 (two) times daily.       Marland Kitchen gabapentin (NEURONTIN) 600 MG tablet Take 600 mg by mouth at bedtime.       . hydrALAZINE (APRESOLINE) 50 MG tablet Take 50 mg by mouth 3 (three) times daily as needed. Does not take dose if BP low      . hydrocodone-acetaminophen (LORCET-HD) 5-500 MG per capsule Take 1 capsule by mouth every 6 (six) hours as needed. As needed for pain.      Marland Kitchen insulin glargine (LANTUS) 100 UNIT/ML injection Inject 80 Units into the skin daily before breakfast.       . losartan (COZAAR) 100 MG tablet Take 100 mg by mouth 2 (two) times daily.       . Meclizine HCl (BONINE) 25 MG CHEW Chew 25 mg by mouth every 6 (six) hours as needed.       . metoprolol (LOPRESSOR) 100 MG tablet Take 50 mg by mouth 2 (two) times daily.      . Multiple Vitamin (MULTIVITAMIN) tablet Take 1 tablet by mouth daily.        . niacin (NIASPAN) 500 MG CR tablet Take 500 mg by mouth  at bedtime.       . nitroGLYCERIN (NITROSTAT) 0.3 MG SL tablet Place 0.3 mg under the tongue every 5 (five) minutes as needed.      Marland Kitchen omeprazole (PRILOSEC) 20 MG capsule Take 20 mg by mouth daily.      . ondansetron (ZOFRAN) 4 MG tablet Take 4 mg by mouth 2 (two) times daily as needed. For nausea       . rosuvastatin (CRESTOR) 40 MG tablet Take 20 mg by mouth daily.       Marland Kitchen topiramate (TOPAMAX) 25 MG tablet Take 100 mg by mouth daily. Take 1 tab 3 times daily      . triamterene-hydrochlorothiazide (MAXZIDE-25) 37.5-25 MG per tablet Use as needed      . Vitamin B Complex-C CAPS Take one capsule everyday      . vitamin B-12 (CYANOCOBALAMIN) 1000 MCG tablet Take 1,000 mcg by mouth daily.      Marland Kitchen zolpidem (AMBIEN) 10 MG tablet Take 10 mg by mouth at bedtime as needed. For sleep.        Allergies  Allergen Reactions  . Amoxicillin Shortness Of Breath and Rash  . Erythromycin Other (See Comments)    Trouble swallowing  . Penicillins Shortness Of Breath and Rash  . Metformin Nausea  Only and Other (See Comments)     dizzy  . Metformin And Related     dizziness and nausea    Past Medical History  Diagnosis Date  . Migraine   . Osteoarthritis   . Vomiting     persistent  . Asthma   . Ventral hernia     hx of  . Diabetes mellitus   . GERD (gastroesophageal reflux disease)   . Obesity 01-2010  . Hyperlipidemia   . Hypertension   . H/O: hysterectomy   . Irregular heart beat   . Myocardial infarction 2011    most recent 2011  . Coronary artery disease   . Heart murmur   . Shortness of breath   . Obstructive sleep apnea     no cpap- sleep study 5 yrs ago  . Anxiety   . PONV (postoperative nausea and vomiting)   . Peripheral vascular disease     ? numbness, tingling arms and legs  . Anemia     hx  . H/O hiatal hernia   . Neuromuscular disorder     ?  Marland Kitchen Dysrhythmia   . Mental disorder     hx of confusion  . Chronic kidney disease     frequency    Past Surgical History  Procedure Date  . Tumor removal     ovarian  . Leg surgery     right  . Hernia repair   . Angioplasty     multiple times  . Cholecysterc   . Cholecystectomy   . Gastric stapling 1970  . Abdominal hysterectomy   . Ankle fracture surgery 70's    lft   . Cardiac catheterization ,01,02,05,06,07,08    stents 02  . Fracture surgery   . Appendectomy   . Coronary artery bypass graft 12/11  . Coronary angioplasty     stents  . Esophagogastroduodenoscopy 08/03/2011    Procedure: ESOPHAGOGASTRODUODENOSCOPY (EGD);  Surgeon: Shann Medal, MD;  Location: [facility];  Service: General;  Laterality: N/A;    Family History  Problem Relation Age of Onset  . Heart disease Father     Heart Disease before age 66  . Diabetes Father   .  Hyperlipidemia Father   . Hypertension Father   . Heart attack Father   . Diabetes      family hx of paternal grandmother, aunt and grandfather- uncles  . Hypertension Mother   . Cancer Sister     History   Social History  . Marital Status:  Married    Spouse Name: N/A    Number of Children: N/A  . Years of Education: N/A   Occupational History  . retired    Social History Main Topics  . Smoking status: Never Smoker   . Smokeless tobacco: Never Used  . Alcohol Use: No  . Drug Use: No  . Sexually Active: Not on file     Comment: hysterectomy   Other Topics Concern  . Not on file   Social History Narrative  . No narrative on file    ROS: Please see the HPI.  All other systems reviewed and negative.  PHYSICAL EXAM:  BP 148/78  Pulse 73  Ht 5' 2.5" (1.588 m)  Wt 253 lb (114.76 kg)  BMI 45.54 kg/m2  SpO2 95%  General: overweight but delightful woman.  Head:  Normocephalic and atraumatic. Neck: no JVD Lungs: Clear to auscultation and percussion. Heart: Normal S1 and S2.  Paradoxical S2 gallop.  Pulses: Pulses normal in all 4 extremities. Extremities: No clubbing or cyanosis. No edema. Neurologic: Alert and oriented x 3.  EKG:  NSR.  Rightward axis.  RAE.  LBBB.  ECHO    Study Conclusions  - Left ventricle: The cavity size was normal. Wall thickness was increased in a pattern of mild LVH. Systolic function was normal. The estimated ejection fraction was in the range of 50% to 55%. Features are consistent with a pseudonormal left ventricular filling pattern, with concomitant abnormal relaxation and increased filling pressure (grade 2 diastolic dysfunction). - Mitral valve: Calcified annulus. Mildly thickened leaflets . Mild to moderate regurgitation. - Pulmonary arteries: PA peak pressure: 58mm Hg (S).     ASSESSMENT AND PLAN:

## 2012-03-09 NOTE — Telephone Encounter (Signed)
lmomtcb x1 for pt 

## 2012-03-09 NOTE — Patient Instructions (Addendum)
Your physician recommends that you schedule a follow-up appointment in: [place] physician recommends that you continue on your current medications as directed. Please refer to the Current Medication list given to you today.

## 2012-03-10 NOTE — Telephone Encounter (Signed)
LMTCB x 1 to notify pt order was placed.

## 2012-03-13 NOTE — Telephone Encounter (Signed)
Pt is aware order has been sent to [facility]

## 2012-03-15 ENCOUNTER — Other Ambulatory Visit: Payer: Self-pay | Admitting: Family Medicine

## 2012-03-15 ENCOUNTER — Ambulatory Visit
Admission: RE | Admit: 2012-03-15 | Discharge: 2012-03-15 | Disposition: A | Discharge status: Home / Self Care | Payer: 59 | Source: Ambulatory Visit | Attending: Family Medicine | Admitting: Family Medicine

## 2012-03-15 VITALS — BP 186/90 | HR 72

## 2012-03-15 DIAGNOSIS — M5412 Radiculopathy, cervical region: Secondary | ICD-10-CM

## 2012-03-15 MED ORDER — IOHEXOL 300 MG/ML  SOLN
1.0000 mL | Freq: Once | INTRAMUSCULAR | Status: AC | PRN
  Administered 2012-03-15: 1 mL via EPIDURAL

## 2012-03-15 MED ORDER — DIAZEPAM 5 MG PO TABS: 10.0000 mg | ORAL_TABLET | Freq: Once | ORAL | Status: DC

## 2012-03-15 MED ORDER — TRIAMCINOLONE ACETONIDE 40 MG/ML IJ SUSP (RADIOLOGY)
60.0000 mg | Freq: Once | INTRAMUSCULAR | Status: AC
  Administered 2012-03-15: 60 mg via EPIDURAL

## 2012-03-16 NOTE — Assessment & Plan Note (Signed)
We had a long discussion regarding this.  Long history of untreated OSA due to mask intolerance.  She is going to give it another try as she understands the importance of this.  It complicates her hypertension, and such, also likely complicates coronary ischemia. She says she will be willing to try various alternatives.

## 2012-03-16 NOTE — Assessment & Plan Note (Signed)
She is being followed by neuro.  Has sig carotid, and seen Dr. Doren Custard.  Spells thought related to migraines.  No surgery as of yet.  Has appropriate follow up scheduled.

## 2012-03-16 NOTE — Assessment & Plan Note (Signed)
Has diabetic CAD with prior bypass.  Has a DES of first generation in the obtuse marginal distally.  She has remained on DAPT.  Benefits are not certain, but certainly has increased risk of non target lesion or vessel related non STEMI.  She is likely a reduced responder given her DM.  Given chest discomfort and desire for medical therapy, will continue.

## 2012-03-16 NOTE — Assessment & Plan Note (Signed)
Has seen Dr. Lorrene Reid recently, and her BP is somewhat improved.

## 2012-03-16 NOTE — Assessment & Plan Note (Signed)
Followed by Dr. Sandi Mariscal who sees and cares for her carefully.

## 2012-03-20 ENCOUNTER — Other Ambulatory Visit: Payer: Self-pay | Admitting: Family Medicine

## 2012-03-20 DIAGNOSIS — M5412 Radiculopathy, cervical region: Secondary | ICD-10-CM

## 2012-03-21 ENCOUNTER — Other Ambulatory Visit: Payer: Self-pay | Admitting: Family Medicine

## 2012-03-21 DIAGNOSIS — H538 Other visual disturbances: Secondary | ICD-10-CM

## 2012-03-22 DIAGNOSIS — I639 Cerebral infarction, unspecified: Secondary | ICD-10-CM

## 2012-03-22 HISTORY — DX: Cerebral infarction, unspecified: I63.9

## 2012-03-26 ENCOUNTER — Other Ambulatory Visit: Payer: 59

## 2012-03-29 ENCOUNTER — Telehealth: Payer: Self-pay | Admitting: *Deleted

## 2012-03-29 NOTE — Telephone Encounter (Signed)
Papers faxed over from [place] have been placed in your [place] [place] folder for your review. [facility] has made a note of what all is needed at the bottom of this paper.   Dr Gwenette Greet, please advise. Thanks.

## 2012-04-05 ENCOUNTER — Telehealth: Payer: Self-pay | Admitting: Cardiology

## 2012-04-11 ENCOUNTER — Ambulatory Visit (INDEPENDENT_AMBULATORY_CARE_PROVIDER_SITE_OTHER): Payer: 59 | Admitting: Cardiology

## 2012-04-11 ENCOUNTER — Encounter: Payer: Self-pay | Admitting: Cardiology

## 2012-04-11 VITALS — BP 180/96 | HR 76 | Ht 63.5 in | Wt 269.0 lb

## 2012-04-11 DIAGNOSIS — G459 Transient cerebral ischemic attack, unspecified: Secondary | ICD-10-CM

## 2012-04-11 DIAGNOSIS — I059 Rheumatic mitral valve disease, unspecified: Secondary | ICD-10-CM

## 2012-04-11 DIAGNOSIS — I340 Nonrheumatic mitral (valve) insufficiency: Secondary | ICD-10-CM

## 2012-04-11 DIAGNOSIS — I6529 Occlusion and stenosis of unspecified carotid artery: Secondary | ICD-10-CM

## 2012-04-11 DIAGNOSIS — R002 Palpitations: Secondary | ICD-10-CM

## 2012-04-11 DIAGNOSIS — I10 Essential (primary) hypertension: Secondary | ICD-10-CM

## 2012-04-11 DIAGNOSIS — I2510 Atherosclerotic heart disease of native coronary artery without angina pectoris: Secondary | ICD-10-CM

## 2012-04-11 NOTE — Patient Instructions (Addendum)
**Note De-Identified  Obfuscation** Your physician has recommended that you wear a 21 day continuous event monitor. Event monitors are medical devices that record the heart's electrical activity. Doctors most often [place] these monitors to diagnose arrhythmias. Arrhythmias are problems with the speed or rhythm of the heartbeat. The monitor is a small, portable device. You can wear one while you do your normal daily activities. This is usually used to diagnose what is causing palpitations/syncope (passing out).  Your physician recommends that you schedule a follow-up appointment in: 1 month

## 2012-04-15 DIAGNOSIS — I340 Nonrheumatic mitral (valve) insufficiency: Secondary | ICD-10-CM | POA: Insufficient documentation

## 2012-04-15 DIAGNOSIS — G459 Transient cerebral ischemic attack, unspecified: Secondary | ICD-10-CM | POA: Insufficient documentation

## 2012-04-15 NOTE — Assessment & Plan Note (Signed)
See note.  Followed by Dr. Lorrene Reid.

## 2012-04-15 NOTE — Assessment & Plan Note (Signed)
Mild to moderate, and not audible.  LIkely due to MAC and HTN.  Monitor.  No obvious source of cardiogenic embolus.

## 2012-04-15 NOTE — Assessment & Plan Note (Signed)
See recent notes from [facility].  Stable at present without change.  Cardionet monitor in place.  No echocardiogram findings to support cause.  Has OSA so is at high risk for unidentified atrial fibrillation.

## 2012-04-15 NOTE — Assessment & Plan Note (Signed)
60-79% on left.  Followed at [facility].  Sees Dr. Scot Dock.

## 2012-04-15 NOTE — Progress Notes (Signed)
HPI:  Since I last saw her she has developed some new issues.  She received a steriod injection for some ongoing back problems, and the next day appeared to have yet another small stroke. She saw Dr. Jannifer Franklin and she was felt to have a R posterior cerebral artery stroke.  He also questioned a complicated migraine, which she has had for a few months.  He ordered a cardionet monitor, and and he got an MRA of the neck.  She remains on dual antiplatelet therapy.  She also continues to be followed by Dr. Lorrene Reid for hypertension, and her primary care is done by Dr. Sandi Mariscal.  There is no overall change since this happened.  We reviewed mechanisms that are potential in some detail.    Current Outpatient Prescriptions  Medication Sig Dispense Refill  . albuterol (PROVENTIL) (2.5 MG/3ML) 0.083% nebulizer solution Take 2.5 mg by nebulization every 6 (six) hours as needed. For shortness of breath.      Marland Kitchen albuterol (PROVENTIL,VENTOLIN) 90 MCG/ACT inhaler Inhale 2 puffs into the lungs every 6 (six) hours as needed. For shortness of breath.      . ALPRAZolam (XANAX) 0.5 MG tablet Take 0.5 mg by mouth 3 (three) times daily as needed. For anxiety - Patient takes 1 in the morning and 1 in the evening daily. If needed she may take one in the afternoon.      Marland Kitchen aspirin EC 81 MG tablet Take 81 mg by mouth daily.      Marland Kitchen buPROPion (WELLBUTRIN XL) 150 MG 24 hr tablet Take 150 mg by mouth daily.      . Calcium 1500 MG tablet Take 1,500 mg by mouth daily.       . Cholecalciferol (VITAMIN D3) 1000 UNITS CAPS Take 2,000 Units by mouth daily.       . cloNIDine (CATAPRES) 0.2 MG tablet Take 0.2 mg by mouth 2 (two) times daily.      . clopidogrel (PLAVIX) 75 MG tablet Take 75 mg by mouth daily.      . ferrous sulfate 325 (65 FE) MG tablet Take 325 mg by mouth daily with breakfast.      . FLUoxetine (PROZAC) 20 MG capsule Take 20 mg by mouth daily.      . Fluticasone-Salmeterol (ADVAIR DISKUS) 250-50 MCG/DOSE AEPB Inhale 1 puff  into the lungs every 12 (twelve) hours as needed. For asthma symptoms      . furosemide (LASIX) 40 MG tablet Take 40 mg by mouth 2 (two) times daily.       Marland Kitchen gabapentin (NEURONTIN) 600 MG tablet Take 600 mg by mouth at bedtime.       . hydrALAZINE (APRESOLINE) 50 MG tablet Take 50 mg by mouth 3 (three) times daily as needed. Does not take dose if BP low      . hydrocodone-acetaminophen (LORCET-HD) 5-500 MG per capsule Take 1 capsule by mouth every 6 (six) hours as needed. As needed for pain.      Marland Kitchen insulin glargine (LANTUS) 100 UNIT/ML injection Inject 100 Units into the skin daily before breakfast.       . losartan (COZAAR) 100 MG tablet Take 100 mg by mouth 2 (two) times daily.       . Meclizine HCl (BONINE) 25 MG CHEW Chew 25 mg by mouth every 6 (six) hours as needed.       . metoprolol (LOPRESSOR) 100 MG tablet Take 50 mg by mouth 2 (two) times daily.      Marland Kitchen  Multiple Vitamin (MULTIVITAMIN) tablet Take 1 tablet by mouth daily.        . niacin (NIASPAN) 500 MG CR tablet Take 500 mg by mouth at bedtime.       . nitroGLYCERIN (NITROSTAT) 0.3 MG SL tablet Place 0.3 mg under the tongue every 5 (five) minutes as needed.      Marland Kitchen omeprazole (PRILOSEC) 20 MG capsule Take 20 mg by mouth daily.      . ondansetron (ZOFRAN) 4 MG tablet Take 4 mg by mouth 2 (two) times daily as needed. For nausea       . rosuvastatin (CRESTOR) 40 MG tablet Take 20 mg by mouth daily.       Marland Kitchen topiramate (TOPAMAX) 25 MG tablet Take 100 mg by mouth daily. Take 1 tab 3 times daily      . triamterene-hydrochlorothiazide (MAXZIDE-25) 37.5-25 MG per tablet Use as needed      . Vitamin B Complex-C CAPS Take one capsule everyday      . vitamin B-12 (CYANOCOBALAMIN) 1000 MCG tablet Take 1,000 mcg by mouth daily.      Marland Kitchen zolpidem (AMBIEN) 10 MG tablet Take 10 mg by mouth at bedtime as needed. For sleep.       No current facility-administered medications for this visit.    Allergies  Allergen Reactions  . Amoxicillin Shortness Of  Breath and Rash  . Erythromycin Other (See Comments)    Trouble swallowing  . Penicillins Shortness Of Breath and Rash  . Metformin Nausea Only and Other (See Comments)     dizzy  . Metformin And Related     dizziness and nausea    Past Medical History  Diagnosis Date  . Migraine   . Osteoarthritis   . Vomiting     persistent  . Asthma   . Ventral hernia     hx of  . Diabetes mellitus   . GERD (gastroesophageal reflux disease)   . Obesity 01-2010  . Hyperlipidemia   . Hypertension   . H/O: hysterectomy   . Irregular heart beat   . Myocardial infarction 2011    most recent 2011  . Coronary artery disease   . Heart murmur   . Shortness of breath   . Obstructive sleep apnea     no cpap- sleep study 5 yrs ago  . Anxiety   . PONV (postoperative nausea and vomiting)   . Peripheral vascular disease     ? numbness, tingling arms and legs  . Anemia     hx  . H/O hiatal hernia   . Neuromuscular disorder     ?  Marland Kitchen Dysrhythmia   . Mental disorder     hx of confusion  . Chronic kidney disease     frequency    Past Surgical History  Procedure Laterality Date  . Tumor removal      ovarian  . Leg surgery      right  . Hernia repair    . Angioplasty      multiple times  . Cholecysterc    . Cholecystectomy    . Gastric stapling  1970  . Abdominal hysterectomy    . Ankle fracture surgery  70's    lft   . Cardiac catheterization  ,01,02,05,06,07,08    stents 02  . Fracture surgery    . Appendectomy    . Coronary artery bypass graft  12/11  . Coronary angioplasty      stents  . Esophagogastroduodenoscopy  08/03/2011  Procedure: ESOPHAGOGASTRODUODENOSCOPY (EGD);  Surgeon: Shann Medal, MD;  Location: [facility];  Service: General;  Laterality: N/A;    Family History  Problem Relation Age of Onset  . Heart disease Father     Heart Disease before age 28  . Diabetes Father   . Hyperlipidemia Father   . Hypertension Father   . Heart attack Father   .  Diabetes      family hx of paternal grandmother, aunt and grandfather- uncles  . Hypertension Mother   . Cancer Sister     History   Social History  . Marital Status: Married    Spouse Name: N/A    Number of Children: N/A  . Years of Education: N/A   Occupational History  . retired    Social History Main Topics  . Smoking status: Never Smoker   . Smokeless tobacco: Never Used  . Alcohol Use: No  . Drug Use: No  . Sexually Active: Not on file     Comment: hysterectomy   Other Topics Concern  . Not on file   Social History Narrative  . No narrative on file    ROS: Please see the HPI.  All other systems reviewed and negative.  PHYSICAL EXAM:  BP 180/96  Pulse 76  Ht 5' 3.5" (1.613 m)  Wt 269 lb (122.018 kg)  BMI 46.9 kg/m2  SpO2 93%  General: Delightful and pleasant, overweight white female  (BMI 46) Head:  Normocephalic and atraumatic. Neck: no JVD Lungs: Clear to auscultation and percussion. Heart: Normal S1 and S2.  Paradoxical S2 gallop.   Pulses:  intact Extremities: No clubbing or cyanosis. No edema. Neurologic: Alert and oriented x 3.  EKG:  Feb 6 NSR LBBB  (cardionet monitor  ASSESSMENT AND PLAN:

## 2012-04-15 NOTE — Assessment & Plan Note (Signed)
Has some intermittent chest pain, usually in the setting of uncontrolled hypertension. Has been some elevated since steroid injection but she is watching and communicating with Dr. Lorrene Reid.  Problematically, she has untreated OSA, and apparently had a denial with her insurer regarding a certain machine adjustment.  I do not know the details.  Hopefully this will get worked out as it impacts her care.

## 2012-04-17 ENCOUNTER — Telehealth: Payer: Self-pay | Admitting: *Deleted

## 2012-04-17 ENCOUNTER — Encounter (INDEPENDENT_AMBULATORY_CARE_PROVIDER_SITE_OTHER): Payer: 59

## 2012-04-17 DIAGNOSIS — I4891 Unspecified atrial fibrillation: Secondary | ICD-10-CM

## 2012-04-17 NOTE — Telephone Encounter (Signed)
21 day event monitor placed on Pt 04/17/12 TK

## 2012-04-21 ENCOUNTER — Other Ambulatory Visit: Payer: Self-pay | Admitting: Pulmonary Disease

## 2012-04-21 DIAGNOSIS — G4733 Obstructive sleep apnea (adult) (pediatric): Secondary | ICD-10-CM

## 2012-05-03 ENCOUNTER — Encounter (HOSPITAL_BASED_OUTPATIENT_CLINIC_OR_DEPARTMENT_OTHER): Payer: 59

## 2012-05-04 ENCOUNTER — Other Ambulatory Visit: Payer: Self-pay | Admitting: Family Medicine

## 2012-05-04 ENCOUNTER — Ambulatory Visit
Admission: RE | Admit: 2012-05-04 | Discharge: 2012-05-04 | Disposition: A | Discharge status: Home / Self Care | Payer: 59 | Source: Ambulatory Visit | Attending: Family Medicine | Admitting: Family Medicine

## 2012-05-04 DIAGNOSIS — M25511 Pain in right shoulder: Secondary | ICD-10-CM

## 2012-05-15 ENCOUNTER — Encounter: Payer: Self-pay | Admitting: Cardiology

## 2012-05-15 ENCOUNTER — Ambulatory Visit (INDEPENDENT_AMBULATORY_CARE_PROVIDER_SITE_OTHER): Payer: 59 | Admitting: Cardiology

## 2012-05-15 VITALS — BP 158/88 | HR 57 | Ht 63.5 in | Wt 272.0 lb

## 2012-05-15 DIAGNOSIS — I2510 Atherosclerotic heart disease of native coronary artery without angina pectoris: Secondary | ICD-10-CM

## 2012-05-15 DIAGNOSIS — I10 Essential (primary) hypertension: Secondary | ICD-10-CM

## 2012-05-15 DIAGNOSIS — Z87898 Personal history of other specified conditions: Secondary | ICD-10-CM

## 2012-05-15 DIAGNOSIS — G4733 Obstructive sleep apnea (adult) (pediatric): Secondary | ICD-10-CM

## 2012-05-15 DIAGNOSIS — I6529 Occlusion and stenosis of unspecified carotid artery: Secondary | ICD-10-CM

## 2012-05-15 DIAGNOSIS — E785 Hyperlipidemia, unspecified: Secondary | ICD-10-CM

## 2012-05-15 NOTE — Patient Instructions (Addendum)
Your physician wants you to follow-up in:  3 months with Dr. McLean.  You will receive a reminder letter in the mail two months in advance. If you don't receive a letter, please call our office to schedule the follow-up appointment.   

## 2012-05-20 NOTE — Assessment & Plan Note (Signed)
This is managed by her primary care MD, Dr. Derinda Late.

## 2012-05-20 NOTE — Assessment & Plan Note (Signed)
She has intermittent chest pain, but for now appears stable.  ECG always demonstrates LBBB.

## 2012-05-20 NOTE — Assessment & Plan Note (Signed)
Seems somewhat better controlled at this point.  Continue under the care of Dr. Lorrene Reid.

## 2012-05-20 NOTE — Assessment & Plan Note (Signed)
Managed by Dr. Jannifer Franklin at [facility].

## 2012-05-20 NOTE — Progress Notes (Signed)
HPI:  The patient returns in a followup visit today. She is a very dear woman who I have known for many years. She has been plagued by morbid obesity, and obstructive sleep apnea that is been unable to be treated. She has had obesity surgery. She is also had diabetes mellitus, and distal circumflex stent implantation, and subsequently underwent revascularization surgery by Dr. Cyndia Bent. She's had chronic hypertension, and this is been a very difficult problem to treat. I've: Managed her with Dr. Jamal Maes, who is been very attentive to her needs, and worked hard to try to control her pressure. She subtotally referred her to the hypertension clinic at [facility], and they had several suggestions. One of the limiting factors has been medication intolerance, and there has been a suggestion that she does not orally of source of her medications. Transdermal therapies have been poorly tolerated due to skin reaction, and we had some hope that the Simplicity-3 trial would be positive.  However, it appears at the present time that ablation of the renal artery nerve complex is unsuccessful at least with presently his device or protocol. The patient is also been plagued by ongoing carotid disease, and neurologic symptoms. He's been carefully evaluated by both the vascular surgeons, and also the neurologists. Because of moderately high-grade disease, and unilateral symptoms carotid endarterectomy had been considered, but this was subsequently abandoned when was felt that the symptoms are primarily due to atypical migraine. She is continued to be followed closely by those services. She remains on a complex regimen of medications. She's also had some intermittent chest pain since her surgery, and how much of this might be related to both hypertension that is poorly treated, anxiety, or even esophageal disease is been uncertain.  I have followed her closely, and certainly will be available after my departure to provide  enlightenment with regard to her complex history.  Currently, her symptoms remain about the same. Her blood pressures have been somewhat better recently  Current Outpatient Prescriptions  Medication Sig Dispense Refill  . albuterol (PROVENTIL) (2.5 MG/3ML) 0.083% nebulizer solution Take 2.5 mg by nebulization every 6 (six) hours as needed. For shortness of breath.      Marland Kitchen albuterol (PROVENTIL,VENTOLIN) 90 MCG/ACT inhaler Inhale 2 puffs into the lungs every 6 (six) hours as needed. For shortness of breath.      . ALPRAZolam (XANAX) 0.5 MG tablet Take 0.5 mg by mouth 3 (three) times daily as needed. For anxiety - Patient takes 1 in the morning and 1 in the evening daily. If needed she may take one in the afternoon.      Marland Kitchen aspirin EC 81 MG tablet Take 81 mg by mouth daily.      Marland Kitchen buPROPion (WELLBUTRIN XL) 150 MG 24 hr tablet Take 150 mg by mouth daily.      . Calcium 1500 MG tablet Take 1,500 mg by mouth daily.       . Cholecalciferol (VITAMIN D) 2000 UNITS tablet Take 2,000 Units by mouth daily.      . cloNIDine (CATAPRES) 0.2 MG tablet Take 0.2 mg by mouth 2 (two) times daily.      . clopidogrel (PLAVIX) 75 MG tablet Take 75 mg by mouth daily.      . ferrous sulfate 325 (65 FE) MG tablet Take 325 mg by mouth daily with breakfast.      . FLUoxetine (PROZAC) 20 MG capsule Take 20 mg by mouth daily.      . Fluticasone-Salmeterol (ADVAIR  DISKUS) 250-50 MCG/DOSE AEPB Inhale 1 puff into the lungs every 12 (twelve) hours as needed. For asthma symptoms      . furosemide (LASIX) 40 MG tablet Take 40 mg by mouth 2 (two) times daily.       Marland Kitchen gabapentin (NEURONTIN) 600 MG tablet Take 600 mg by mouth at bedtime.       . hydrALAZINE (APRESOLINE) 50 MG tablet Take 50 mg by mouth 3 (three) times daily as needed. Does not take dose if BP low      . hydrocodone-acetaminophen (LORCET-HD) 5-500 MG per capsule Take 1 capsule by mouth every 6 (six) hours as needed. As needed for pain.      Marland Kitchen insulin glargine (LANTUS)  100 UNIT/ML injection Inject 50 Units into the skin 2 (two) times daily.       Marland Kitchen losartan (COZAAR) 100 MG tablet Take 100 mg by mouth 2 (two) times daily.       . Meclizine HCl (BONINE) 25 MG CHEW Chew 25 mg by mouth every 6 (six) hours as needed.       . metoprolol (LOPRESSOR) 100 MG tablet Take 50 mg by mouth 2 (two) times daily.      . Multiple Vitamin (MULTIVITAMIN) tablet Take 1 tablet by mouth daily.        . niacin (NIASPAN) 1000 MG CR tablet Take 1,000 mg by mouth at bedtime.      . nitroGLYCERIN (NITROSTAT) 0.3 MG SL tablet Place 0.3 mg under the tongue every 5 (five) minutes as needed.      Marland Kitchen omeprazole (PRILOSEC) 20 MG capsule Take 20 mg by mouth daily.      . ondansetron (ZOFRAN) 4 MG tablet Take 4 mg by mouth 2 (two) times daily as needed. For nausea       . rosuvastatin (CRESTOR) 40 MG tablet Take 20 mg by mouth daily.       Marland Kitchen topiramate (TOPAMAX) 50 MG tablet Take 50 mg by mouth 2 (two) times daily.      Marland Kitchen triamterene-hydrochlorothiazide (MAXZIDE-25) 37.5-25 MG per tablet Use as needed      . Vitamin B Complex-C CAPS Take one capsule everyday      . vitamin B-12 (CYANOCOBALAMIN) 1000 MCG tablet Take 1,000 mcg by mouth daily.      Marland Kitchen zolpidem (AMBIEN) 10 MG tablet Take 10 mg by mouth at bedtime as needed. For sleep.       No current facility-administered medications for this visit.    Allergies  Allergen Reactions  . Amoxicillin Shortness Of Breath and Rash  . Erythromycin Other (See Comments)    Trouble swallowing  . Penicillins Shortness Of Breath and Rash  . Metformin Nausea Only and Other (See Comments)     dizzy  . Metformin And Related     dizziness and nausea    Past Medical History  Diagnosis Date  . Migraine   . Osteoarthritis   . Vomiting     persistent  . Asthma   . Ventral hernia     hx of  . Diabetes mellitus   . GERD (gastroesophageal reflux disease)   . Obesity 01-2010  . Hyperlipidemia   . Hypertension   . H/O: hysterectomy   . Irregular  heart beat   . Myocardial infarction 2011    most recent 2011  . Coronary artery disease   . Heart murmur   . Shortness of breath   . Obstructive sleep apnea     no  cpap- sleep study 5 yrs ago  . Anxiety   . PONV (postoperative nausea and vomiting)   . Peripheral vascular disease     ? numbness, tingling arms and legs  . Anemia     hx  . H/O hiatal hernia   . Neuromuscular disorder     ?  Marland Kitchen Dysrhythmia   . Mental disorder     hx of confusion  . Chronic kidney disease     frequency    Past Surgical History  Procedure Laterality Date  . Tumor removal      ovarian  . Leg surgery      right  . Hernia repair    . Angioplasty      multiple times  . Cholecysterc    . Cholecystectomy    . Gastric stapling  1970  . Abdominal hysterectomy    . Ankle fracture surgery  70's    lft   . Cardiac catheterization  ,01,02,05,06,07,08    stents 02  . Fracture surgery    . Appendectomy    . Coronary artery bypass graft  12/11  . Coronary angioplasty      stents  . Esophagogastroduodenoscopy  08/03/2011    Procedure: ESOPHAGOGASTRODUODENOSCOPY (EGD);  Surgeon: Shann Medal, MD;  Location: [facility];  Service: General;  Laterality: N/A;    Family History  Problem Relation Age of Onset  . Heart disease Father     Heart Disease before age 55  . Diabetes Father   . Hyperlipidemia Father   . Hypertension Father   . Heart attack Father   . Diabetes      family hx of paternal grandmother, aunt and grandfather- uncles  . Hypertension Mother   . Cancer Sister     History   Social History  . Marital Status: Married    Spouse Name: N/A    Number of Children: N/A  . Years of Education: N/A   Occupational History  . retired    Social History Main Topics  . Smoking status: Never Smoker   . Smokeless tobacco: Never Used  . Alcohol Use: No  . Drug Use: No  . Sexually Active: Not on file     Comment: hysterectomy   Other Topics Concern  . Not on file   Social  History Narrative  . No narrative on file    ROS: Please see the HPI.  All other systems reviewed and negative.  PHYSICAL EXAM:  BP 158/88  Pulse 57  Ht 5' 3.5" (1.613 m)  Wt 272 lb (123.378 kg)  BMI 47.42 kg/m2  SpO2 95%  General: Well developed, well nourished, in no acute distress. Head:  Normocephalic and atraumatic. Neck: no JVD Lungs: Clear to auscultation and percussion. Heart: Normal S1 and S2. Paradoxical splitting of S2.   Pulses: Pulses normal in all 4 extremities. Extremities: No clubbing or cyanosis. No edema. Neurologic: Alert and oriented x 3.  EKG:    ASSESSMENT AND PLAN:  See assessment and plan. FU with Dr. [name].

## 2012-05-20 NOTE — Assessment & Plan Note (Signed)
Please see notes.  This has been problematic.  Now working with Dr. Gwenette Greet again to be treated.  Has trouble with masks, but may be responsible for better BP control.  Weight loss has always been problematic.

## 2012-05-20 NOTE — Assessment & Plan Note (Signed)
Being followed by Vascular surgery.

## 2012-05-22 NOTE — Addendum Note (Signed)
Addended by: Barkley Boards on: 05/22/2012 10:36 AM   Modules accepted: Orders

## 2012-06-07 ENCOUNTER — Ambulatory Visit: Payer: Self-pay | Admitting: Neurosurgery

## 2012-06-07 ENCOUNTER — Other Ambulatory Visit: Payer: 59

## 2012-06-22 ENCOUNTER — Other Ambulatory Visit: Payer: Self-pay | Admitting: Neurology

## 2012-06-27 ENCOUNTER — Encounter: Payer: Self-pay | Admitting: Cardiology

## 2012-07-10 ENCOUNTER — Ambulatory Visit (INDEPENDENT_AMBULATORY_CARE_PROVIDER_SITE_OTHER): Payer: 59 | Admitting: Nurse Practitioner

## 2012-07-10 ENCOUNTER — Encounter: Payer: Self-pay | Admitting: Nurse Practitioner

## 2012-07-10 VITALS — BP 160/90 | HR 72 | Ht 64.0 in | Wt 274.0 lb

## 2012-07-10 DIAGNOSIS — I6350 Cerebral infarction due to unspecified occlusion or stenosis of unspecified cerebral artery: Secondary | ICD-10-CM

## 2012-07-10 DIAGNOSIS — I679 Cerebrovascular disease, unspecified: Secondary | ICD-10-CM | POA: Insufficient documentation

## 2012-07-10 DIAGNOSIS — I10 Essential (primary) hypertension: Secondary | ICD-10-CM

## 2012-07-10 NOTE — Progress Notes (Signed)
HPI: -Patient returns for followup after her last visit with Dr. Jannifer Franklin 03/24/12. She has a history  of obesity, migraine headache, diabetes, and hypertension. The patient has a lifelong history of classic migraine associated with posterior headache and visual disturbances. The patient has had episodes of mutism and difficulty with understanding and generating language with several of her headaches. The patient has been seen through this office previously for evaluation of these headaches. The patient has continued to get her headaches on occasion. The patient developed a headache in February  that was associated with generalized blurring of vision in the right and left visual fields. The patient did not have speech changes with this. The patient has not had any improvement in the vision, and she went to see her ophthalmologist. The patient was eventually referred for MRI evaluation of the brain, and this confirmed a right posterior cerebral artery distribution stroke. Around the time of onset of symptoms, the patient was running blood sugars in the 400 range. The patient had a MRA that showed some distal stenosis of the right posterior cerebral artery, and some stenosis that was previously documented in the left P1 segment of the posterior cerebral artery. The patient continues to note generalized blurring of vision, and difficulty with focusing on things but she thinks it is some better. She  denies any numbness or weakness of the arms, legs, or face. The patient denies any significant change in balance. The patient has not had any problems with speech or swallowing. The patient does have a headache in retro-orbital areas and back of the head. The patient has recently undergone a 2-D echocardiogram in November 2013 with an ejection fraction of 50-55%. The patient has been followed by Dr. Doren Custard with the carotid Doppler studies the last study was done in November 2013. The patient has 60-79% stenosis of the left  internal carotid artery. No significant stenosis was seen on the right. She has another appt this week for repeat dopplers. The patient is on aspirin and Plavix. Her blood pressure is noted to be high today and in retaking it dropped to 0000000 systolic. Apparently it has been difficult to control in the past. Her cardiologist is Dr. [name]    ROS:  Blurred vision, palpitations, fatigue, joint pain allergies dizziness and headache  Physical Exam General: well developed, morbidly obese seated, in no evident distress Head: head normocephalic and atraumatic. Oropharynx benign Neck: supple with no carotid  bruits Cardiovascular: regular rate and rhythm, no murmurs  Neurologic Exam Mental Status: Awake and fully alert. Oriented to place and time. Follows all commands. Speech and language normal.   Cranial Nerves: Fundoscopic exam reveals flat  disc margins. Pupils equal, briskly reactive to light. Extraocular movements full without nystagmus. Visual fields full to confrontation. Hearing intact and symmetric to finger snap. Facial sensation intact. Face, tongue, palate move normally and symmetrically. Neck flexion and extension normal.  Motor: Normal bulk and tone. Normal strength in all tested extremity muscles.No focal weakness Sensory.: intact to touch and pinprick and vibratory.  Coordination: Rapid alternating movements normal in all extremities. Finger-to-nose and heel-to-shin performed accurately bilaterally. Gait and Station: Arises from chair without difficulty. Stance is normal. Able to heel, toe and unsteady with tandem walk..  Reflexes: 2+ and symmetric. Toes downgoing.     ASSESSMENT: Right posterior cerebral artery distribution stroke History of migraines History of diabetes and uncontrolled hypertension     PLAN: Continue Plavix and aspirin for secondary stroke prevention Will renew Plavix if needed  Keep blood pressure less than [id number] systolic Diabetes with hemoglobin A1c 6.5 or  less Healthy diet and slow weight reduction Followup in 4 months  Dennie Bible, GNP-BC APRN

## 2012-07-10 NOTE — Patient Instructions (Addendum)
Continue Plavix and aspirin for secondary stroke prevention Will renew Plavix Keep blood pressure less than [id number] systolic Diabetes with hemoglobin A1c 6.5 or less Healthy diet and slow weight reduction Followup in 6 months

## 2012-07-11 ENCOUNTER — Encounter: Payer: Self-pay | Admitting: Vascular Surgery

## 2012-07-12 ENCOUNTER — Other Ambulatory Visit: Payer: Self-pay

## 2012-07-12 ENCOUNTER — Ambulatory Visit (INDEPENDENT_AMBULATORY_CARE_PROVIDER_SITE_OTHER): Payer: 59 | Admitting: Vascular Surgery

## 2012-07-12 ENCOUNTER — Other Ambulatory Visit (INDEPENDENT_AMBULATORY_CARE_PROVIDER_SITE_OTHER): Payer: 59 | Admitting: *Deleted

## 2012-07-12 ENCOUNTER — Encounter: Payer: Self-pay | Admitting: Vascular Surgery

## 2012-07-12 DIAGNOSIS — I6529 Occlusion and stenosis of unspecified carotid artery: Secondary | ICD-10-CM

## 2012-07-12 DIAGNOSIS — R42 Dizziness and giddiness: Secondary | ICD-10-CM

## 2012-07-12 DIAGNOSIS — I639 Cerebral infarction, unspecified: Secondary | ICD-10-CM

## 2012-07-12 NOTE — Progress Notes (Signed)
[facility] [place]  Patient name: [name] [name] [name]: [id number] DOB: 18-May-1945 Sex: female  REASON FOR VISIT: follow up of carotid disease.  HPI: [name] [name] is a 67 y.o. female with a complicated history. She has a known 60-79% left carotid stenosis which I have been following. We have been considering a left carotid endarterectomy after she had an episode that sounded potentially like expressive aphasia, however she underwent a cerebral arteriogram in February of 2013 which showed that the stenosis was only approximately 50%. In addition, I discussed the case with Dr. Floyde Parkins who felt that she was likely having migraines and that this was not related to the left carotid disease. Of note, cerebral arteriography in February 2013 did not show any significant vertebral artery disease. He has been on aspirin and Plavix. I last saw her in November of 2013.  Since that time, she had a right posterior cerebral artery ischemic stroke associated with vision loss in both eyes. She's had no focal upper extremity or lower tree weakness and no further expressive aphasia. However workup in February showed a subacute right posterior cerebral artery ischemic infarct. An MRA showed that the right vertebral artery origin was poorly visualized and that she could potentially have a high-grade stenosis. There was no other stenosis noted up to the vertebrobasilar junction. Was a 50% left internal carotid artery stenosis and also a right external carotid artery stenosis noted.  Past Medical History  Diagnosis Date  . Migraine   . Osteoarthritis   . Vomiting     persistent  . Asthma   . Ventral hernia     hx of  . Diabetes mellitus   . GERD (gastroesophageal reflux disease)   . Obesity 01-2010  . Hyperlipidemia   . Hypertension   . H/O: hysterectomy   . Irregular heart beat   . Myocardial infarction 2011    most recent 2011  . Coronary artery disease   . Heart murmur   . Shortness  of breath   . Obstructive sleep apnea     no cpap- sleep study 5 yrs ago  . Anxiety   . PONV (postoperative nausea and vomiting)   . Peripheral vascular disease     ? numbness, tingling arms and legs  . Anemia     hx  . H/O hiatal hernia   . Neuromuscular disorder     ?  Marland Kitchen Dysrhythmia   . Mental disorder     hx of confusion  . Chronic kidney disease     frequency  . Stroke    Family History  Problem Relation Age of Onset  . Heart disease Father     Heart Disease before age 21  . Diabetes Father   . Hyperlipidemia Father   . Hypertension Father   . Heart attack Father   . Diabetes      family hx of paternal grandmother, aunt and grandfather- uncles  . Hypertension Mother   . Cancer Sister    SOCIAL HISTORY: History  Substance Use Topics  . Smoking status: Never Smoker   . Smokeless tobacco: Never Used  . Alcohol Use: No   Allergies  Allergen Reactions  . Amoxicillin Shortness Of Breath and Rash  . Erythromycin Other (See Comments)    Trouble swallowing  . Penicillins Shortness Of Breath and Rash  . Metformin Nausea Only and Other (See Comments)     dizzy  . Metformin And Related     dizziness and nausea  REVIEW OF SYSTEMS: Valu.Nieves ] denotes positive finding; [  ] denotes negative finding  CARDIOVASCULAR:  Valu.Nieves ] chest pain   [ ]  chest pressure   Valu.Nieves ] palpitations   [ ]  orthopnea   Valu.Nieves ] dyspnea on exertion   Valu.Nieves ] claudication   [ ]  rest pain   [ ]  DVT   [ ]  phlebitis PULMONARY:   [ ]  productive cough   Valu.Nieves ] asthma   [ ]  wheezing NEUROLOGIC:   [ ]  weakness  [ ]  paresthesias  [ ]  aphasia  [ ]  amaurosis  Valu.Nieves ] dizziness HEMATOLOGIC:   [ ]  bleeding problems   [ ]  clotting disorders MUSCULOSKELETAL:  [ ]  joint pain   [ ]  joint swelling Valu.Nieves ] leg swelling GASTROINTESTINAL: [ ]   blood in stool  [ ]   hematemesis GENITOURINARY:  [ ]   dysuria  [ ]   hematuria PSYCHIATRIC:  [ ]  history of major depression INTEGUMENTARY:  [ ]  rashes  [ ]  ulcers CONSTITUTIONAL:  [ ]  fever   [ ]   chills  PHYSICAL EXAM: Filed Vitals:   07/12/12 1234 07/12/12 1237  BP: 155/60 158/46  Pulse: 64   Height: 5\' 4"  (1.626 m)   Weight: 268 lb (121.564 kg)   SpO2: 96%    Body mass index is 45.98 kg/(m^2). GENERAL: The patient is a well-nourished female, in no acute distress. The vital signs are documented above. CARDIOVASCULAR: There is a regular rate and rhythm. I do not detect carotid bruits. She does have palpable femoral pulses. PULMONARY: There is good air exchange bilaterally without wheezing or rales. ABDOMEN: Soft and non-tender with normal pitched bowel sounds.  MUSCULOSKELETAL: There are no major deformities or cyanosis. NEUROLOGIC: No focal weakness or paresthesias are detected. SKIN: There are no ulcers or rashes noted. PSYCHIATRIC: The patient has a normal affect.  DATA:  The results of her MRA of the neck in CT of the head are discussed above.  I have independently interpreted her carotid duplex scan in our office today which shows no significant carotid stenosis on the right. She has a 60-79% left carotid stenosis. Both vertebral arteries have normally directed flow. Arm pressures are essentially equal.  MEDICAL ISSUES:  RIGHT POSTERIOR CEREBRAL ARTERY ISCHEMIC INFARCT: is is based on her CT head on 03/30/2012. The MRA of the next digestive vertebral artery disease on the right which was not present in February of 2013. She has also had problems with dizziness. This reason I would recommend arch aortogram and cerebral arteriography to focus on the vertebral basilar system as a potential cause of her stroke and dizziness. We will arrange for this to be performed by Dr. Estanislado Pandy given that she might be a candidate for vertebral artery angioplasty and stenting if there is significant disease present.  With respect to her 60-79% left carotid stenosis this is asymptomatic and I would simply recommend a follow carotid duplex scan in 6 months. Understands we would not consider  left carotid endarterectomy unless the stenosis progressed to greater than 80% or he developed new neurologic symptoms. See her back after her cerebral arteriogram. In the meantime she knows to continue her aspirin and Plavix.   [place] [facility] [place] Beeper: [phone]

## 2012-07-13 ENCOUNTER — Encounter: Payer: Self-pay | Admitting: Diagnostic Neuroimaging

## 2012-07-17 ENCOUNTER — Ambulatory Visit (HOSPITAL_COMMUNITY)
Admission: RE | Admit: 2012-07-17 | Discharge: 2012-07-17 | Disposition: A | Discharge status: Home / Self Care | Payer: 59 | Source: Ambulatory Visit | Attending: Vascular Surgery | Admitting: Vascular Surgery

## 2012-07-17 DIAGNOSIS — R42 Dizziness and giddiness: Secondary | ICD-10-CM

## 2012-07-17 DIAGNOSIS — I639 Cerebral infarction, unspecified: Secondary | ICD-10-CM

## 2012-07-20 ENCOUNTER — Encounter (HOSPITAL_COMMUNITY): Payer: Self-pay | Admitting: Pharmacy Technician

## 2012-07-20 ENCOUNTER — Other Ambulatory Visit: Payer: Self-pay | Admitting: Radiology

## 2012-07-20 ENCOUNTER — Other Ambulatory Visit: Payer: Self-pay | Admitting: *Deleted

## 2012-07-20 ENCOUNTER — Other Ambulatory Visit (HOSPITAL_COMMUNITY): Payer: Self-pay | Admitting: Interventional Radiology

## 2012-07-20 DIAGNOSIS — R42 Dizziness and giddiness: Secondary | ICD-10-CM

## 2012-07-20 DIAGNOSIS — I639 Cerebral infarction, unspecified: Secondary | ICD-10-CM

## 2012-07-20 DIAGNOSIS — I771 Stricture of artery: Secondary | ICD-10-CM

## 2012-07-21 NOTE — Progress Notes (Signed)
I reviewed note and agree with plan.   Isaia Hassell R. Toron Bowring, MD 07/21/2012, 12:08 AM Certified in Neurology, Neurophysiology and Neuroimaging  [facility] [address], [address] [place], [place] [address] [phone]  

## 2012-07-25 ENCOUNTER — Ambulatory Visit (HOSPITAL_COMMUNITY)
Admission: RE | Admit: 2012-07-25 | Discharge: 2012-07-25 | Disposition: A | Discharge status: Home / Self Care | Payer: 59 | Source: Ambulatory Visit | Attending: Interventional Radiology | Admitting: Interventional Radiology

## 2012-07-25 ENCOUNTER — Other Ambulatory Visit (HOSPITAL_COMMUNITY): Payer: Self-pay | Admitting: Interventional Radiology

## 2012-07-25 ENCOUNTER — Encounter (HOSPITAL_COMMUNITY): Payer: Self-pay

## 2012-07-25 DIAGNOSIS — R42 Dizziness and giddiness: Secondary | ICD-10-CM

## 2012-07-25 DIAGNOSIS — I771 Stricture of artery: Secondary | ICD-10-CM

## 2012-07-25 DIAGNOSIS — I639 Cerebral infarction, unspecified: Secondary | ICD-10-CM

## 2012-07-25 DIAGNOSIS — I6529 Occlusion and stenosis of unspecified carotid artery: Secondary | ICD-10-CM | POA: Insufficient documentation

## 2012-07-25 DIAGNOSIS — I671 Cerebral aneurysm, nonruptured: Secondary | ICD-10-CM | POA: Insufficient documentation

## 2012-07-25 DIAGNOSIS — I672 Cerebral atherosclerosis: Secondary | ICD-10-CM | POA: Insufficient documentation

## 2012-07-25 LAB — CBC WITH DIFFERENTIAL/PLATELET
Basophils Absolute: 0 10*3/uL (ref 0.0–0.1)
Basophils Relative: 1 % (ref 0–1)
Eosinophils Absolute: 0.2 10*3/uL (ref 0.0–0.7)
Eosinophils Relative: 2 % (ref 0–5)
HCT: 44.1 % (ref 36.0–46.0)
Hemoglobin: 15.8 g/dL — ABNORMAL HIGH (ref 12.0–15.0)
Lymphocytes Relative: 27 % (ref 12–46)
Lymphs Abs: 2.1 10*3/uL (ref 0.7–4.0)
MCH: 31.9 pg (ref 26.0–34.0)
MCHC: 35.8 g/dL (ref 30.0–36.0)
MCV: 88.9 fL (ref 78.0–100.0)
Monocytes Absolute: 0.5 10*3/uL (ref 0.1–1.0)
Monocytes Relative: 7 % (ref 3–12)
Neutro Abs: 4.9 10*3/uL (ref 1.7–7.7)
Neutrophils Relative %: 64 % (ref 43–77)
Platelets: 242 10*3/uL (ref 150–400)
RBC: 4.96 MIL/uL (ref 3.87–5.11)
RDW: 12.6 % (ref 11.5–15.5)
WBC: 7.7 10*3/uL (ref 4.0–10.5)

## 2012-07-25 LAB — BASIC METABOLIC PANEL
BUN: 21 mg/dL (ref 6–23)
CO2: 27 mEq/L (ref 19–32)
Calcium: 9 mg/dL (ref 8.4–10.5)
Chloride: 102 mEq/L (ref 96–112)
Creatinine, Ser: 0.71 mg/dL (ref 0.50–1.10)
GFR calc Af Amer: >90 mL/min (ref >90)
GFR calc non Af Amer: 89 mL/min — ABNORMAL LOW (ref >90)
Glucose, Bld: 207 mg/dL — ABNORMAL HIGH (ref 70–99)
Potassium: 3.8 mEq/L (ref 3.5–5.1)
Sodium: 140 mEq/L (ref 135–145)

## 2012-07-25 LAB — GLUCOSE, CAPILLARY: Glucose-Capillary: 194 mg/dL — ABNORMAL HIGH (ref 70–99)

## 2012-07-25 LAB — APTT: aPTT: 29 seconds (ref 24–37)

## 2012-07-25 LAB — PROTIME-INR
INR: 0.97 (ref 0.00–1.49)
Prothrombin Time: 12.8 seconds (ref 11.6–15.2)

## 2012-07-25 MED ORDER — MIDAZOLAM HCL 2 MG/2ML IJ SOLN
INTRAMUSCULAR | Status: DC | PRN
  Administered 2012-07-25: 1 mg via INTRAVENOUS
  Administered 2012-07-25: 0.5 mg via INTRAVENOUS
  Administered 2012-07-25: 1 mg via INTRAVENOUS

## 2012-07-25 MED ORDER — SODIUM CHLORIDE 0.9 % IV SOLN: INTRAVENOUS | Status: AC

## 2012-07-25 MED ORDER — HYDRALAZINE HCL 20 MG/ML IJ SOLN
INTRAMUSCULAR | Status: DC | PRN
  Administered 2012-07-25 (×4): 5 mg via INTRAVENOUS

## 2012-07-25 MED ORDER — HEPARIN SOD (PORK) LOCK FLUSH 100 UNIT/ML IV SOLN
INTRAVENOUS | Status: DC | PRN
  Administered 2012-07-25: 500 [IU] via INTRAVENOUS

## 2012-07-25 MED ORDER — MIDAZOLAM HCL 2 MG/2ML IJ SOLN
INTRAMUSCULAR | Status: AC
  Filled 2012-07-25: qty 4

## 2012-07-25 MED ORDER — SODIUM CHLORIDE 0.9 % IV SOLN
INTRAVENOUS | Status: DC | PRN
  Administered 2012-07-25: 75 mL/h via INTRAVENOUS

## 2012-07-25 MED ORDER — HYDRALAZINE HCL 20 MG/ML IJ SOLN
INTRAMUSCULAR | Status: AC
  Filled 2012-07-25: qty 1

## 2012-07-25 MED ORDER — IOHEXOL 300 MG/ML  SOLN
150.0000 mL | Freq: Once | INTRAMUSCULAR | Status: AC | PRN
  Administered 2012-07-25: 100 mL via INTRA_ARTERIAL

## 2012-07-25 MED ORDER — FENTANYL CITRATE 0.05 MG/ML IJ SOLN
INTRAMUSCULAR | Status: DC | PRN
  Administered 2012-07-25: 12.5 ug via INTRAVENOUS
  Administered 2012-07-25 (×2): 25 ug via INTRAVENOUS

## 2012-07-25 MED ORDER — SODIUM CHLORIDE 0.9 % IV SOLN
Freq: Once | INTRAVENOUS | Status: AC
  Administered 2012-07-25: 08:00:00 via INTRAVENOUS

## 2012-07-25 MED ORDER — FENTANYL CITRATE 0.05 MG/ML IJ SOLN
INTRAMUSCULAR | Status: AC
  Filled 2012-07-25: qty 2

## 2012-07-25 NOTE — Procedures (Signed)
S/P 4 vessel cerebral arteriogram. RT CFA approach. Findings. 1Approx 70 % stenosis RT VA origin. 2.Appro 60 - 65 % stenosis LT ICA prox. 3.. 3.0 mm x 2.5 mm LT PCOM aneurysm 4. >70 % stenosis Lt PCA prox.

## 2012-07-25 NOTE — H&P (Signed)
[name] [name] is an 67 y.o. female.   Chief Complaint: Pt suffered CVA 03/2012 Visual change only B tunnel vision x 2 weeks MRI/MRA revealed B carotic stenosis and Vertebral artery severe stenosis sxs better; still with visual changes: loss of B medial/upper quarter of vision Doppler 07/2012 shows B carotid stenosis Scheduled for cerebral arteriogram now HPI: Migraines; DM; obese; GERD; CVA; CAD/MI/CABG; HTN; HLD; sleep apnea; PVD  Past Medical History  Diagnosis Date  . Migraine   . Osteoarthritis   . Vomiting     persistent  . Asthma   . Ventral hernia     hx of  . Diabetes mellitus   . GERD (gastroesophageal reflux disease)   . Obesity 01-2010  . Hyperlipidemia   . Hypertension   . H/O: hysterectomy   . Irregular heart beat   . Myocardial infarction 2011    most recent 2011  . Coronary artery disease   . Heart murmur   . Shortness of breath   . Obstructive sleep apnea     no cpap- sleep study 5 yrs ago  . Anxiety   . PONV (postoperative nausea and vomiting)   . Peripheral vascular disease     ? numbness, tingling arms and legs  . Anemia     hx  . H/O hiatal hernia   . Neuromuscular disorder     ?  Marland Kitchen Dysrhythmia   . Mental disorder     hx of confusion  . Chronic kidney disease     frequency  . Stroke     Past Surgical History  Procedure Laterality Date  . Tumor removal      ovarian  . Leg surgery      right  . Hernia repair    . Angioplasty      multiple times  . Cholecysterc    . Cholecystectomy    . Gastric stapling  1970  . Abdominal hysterectomy    . Ankle fracture surgery  70's    lft   . Cardiac catheterization  ,01,02,05,06,07,08    stents 02  . Fracture surgery    . Appendectomy    . Coronary artery bypass graft  12/11  . Coronary angioplasty      stents  . Esophagogastroduodenoscopy  08/03/2011    Procedure: ESOPHAGOGASTRODUODENOSCOPY (EGD);  Surgeon: Shann Medal, MD;  Location: [facility];  Service: General;  Laterality: N/A;     Family History  Problem Relation Age of Onset  . Heart disease Father     Heart Disease before age 34  . Diabetes Father   . Hyperlipidemia Father   . Hypertension Father   . Heart attack Father   . Diabetes      family hx of paternal grandmother, aunt and grandfather- uncles  . Hypertension Mother   . Cancer Sister    Social History:  reports that she has never smoked. She has never used smokeless tobacco. She reports that she does not drink alcohol or use illicit drugs.  Allergies:  Allergies  Allergen Reactions  . Amoxicillin Shortness Of Breath and Rash  . Erythromycin Other (See Comments)    Trouble swallowing  . Penicillins Shortness Of Breath and Rash  . Metformin Nausea Only and Other (See Comments)     dizzy  . Metformin And Related     dizziness and nausea  . Tape Other (See Comments)    Plastic tape causes skin to rip if left on for long periods of time     (  Not in a hospital admission)  Results for orders placed during the hospital encounter of 07/25/12 (from the past 48 hour(s))  APTT     Status: None   Collection Time    07/25/12  8:05 AM      Result Value Range   aPTT 29  24 - 37 seconds  CBC WITH DIFFERENTIAL     Status: Abnormal   Collection Time    07/25/12  8:05 AM      Result Value Range   WBC 7.7  4.0 - 10.5 K/uL   RBC 4.96  3.87 - 5.11 MIL/uL   Hemoglobin 15.8 (*) 12.0 - 15.0 g/dL   HCT 44.1  36.0 - 46.0 %   MCV 88.9  78.0 - 100.0 fL   MCH 31.9  26.0 - 34.0 pg   MCHC 35.8  30.0 - 36.0 g/dL   RDW 12.6  11.5 - 15.5 %   Platelets 242  150 - 400 K/uL   Neutrophils Relative % 64  43 - 77 %   Neutro Abs 4.9  1.7 - 7.7 K/uL   Lymphocytes Relative 27  12 - 46 %   Lymphs Abs 2.1  0.7 - 4.0 K/uL   Monocytes Relative 7  3 - 12 %   Monocytes Absolute 0.5  0.1 - 1.0 K/uL   Eosinophils Relative 2  0 - 5 %   Eosinophils Absolute 0.2  0.0 - 0.7 K/uL   Basophils Relative 1  0 - 1 %   Basophils Absolute 0.0  0.0 - 0.1 K/uL  PROTIME-INR      Status: None   Collection Time    07/25/12  8:05 AM      Result Value Range   Prothrombin Time 12.8  11.6 - 15.2 seconds   INR 0.97  0.00 - 1.49  GLUCOSE, CAPILLARY     Status: Abnormal   Collection Time    07/25/12  8:27 AM      Result Value Range   Glucose-Capillary 194 (*) 70 - 99 mg/dL   Comment 1 Documented in Chart     Comment 2 Notify RN     No results found.  Review of Systems  Constitutional: Negative for fever.  Eyes:       Tunnel vision B  Respiratory: Negative for cough and shortness of breath.   Cardiovascular: Negative for chest pain.  Gastrointestinal: Negative for nausea, vomiting and abdominal pain.  Neurological: Negative for dizziness, tingling, sensory change, speech change, loss of consciousness, weakness and headaches.  Psychiatric/Behavioral: Negative for memory loss.    Blood pressure 198/86, pulse 68, temperature 98 F (36.7 C), temperature source Oral, resp. rate 18, height 5\' 3"  (1.6 m), weight 255 lb (115.667 kg), SpO2 98.00%. Physical Exam  Constitutional: She is oriented to person, place, and time. She appears well-nourished.  obese  Eyes: EOM are normal.  Neck: Neck supple.  Cardiovascular: Normal rate, regular rhythm and normal heart sounds.   No murmur heard. Respiratory: Effort normal and breath sounds normal. She has no wheezes.  GI: Soft. Bowel sounds are normal. There is no tenderness.  Musculoskeletal: Normal range of motion.  Neurological: She is alert and oriented to person, place, and time. No cranial nerve deficit. Coordination normal.  Skin: Skin is warm and dry.  Psychiatric: She has a normal mood and affect. Her behavior is normal. Judgment and thought content normal.     Assessment/Plan CVA 03/2012 B tunnel vision--better 07/2012 doppler shows continued B carotid stenosis  MRA reveals severe vertebral stenosis Scheduled now for cerebral arteriogram Pt aware of procedure benefits and risks and agreeable to proceed Consent  signed and in chart  Lovenia Debruler A 07/25/2012, 8:41 AM

## 2012-07-28 ENCOUNTER — Other Ambulatory Visit (HOSPITAL_COMMUNITY): Payer: Self-pay | Admitting: Interventional Radiology

## 2012-07-28 DIAGNOSIS — I729 Aneurysm of unspecified site: Secondary | ICD-10-CM

## 2012-08-01 ENCOUNTER — Ambulatory Visit (HOSPITAL_COMMUNITY)
Admission: RE | Admit: 2012-08-01 | Discharge: 2012-08-01 | Disposition: A | Discharge status: Home / Self Care | Payer: 59 | Source: Ambulatory Visit | Attending: Interventional Radiology | Admitting: Interventional Radiology

## 2012-08-01 DIAGNOSIS — I729 Aneurysm of unspecified site: Secondary | ICD-10-CM

## 2012-08-21 ENCOUNTER — Ambulatory Visit: Payer: 59 | Admitting: Cardiology

## 2012-09-21 ENCOUNTER — Telehealth: Payer: Self-pay | Admitting: Cardiology

## 2012-09-21 ENCOUNTER — Encounter: Payer: Self-pay | Admitting: Cardiology

## 2012-09-21 ENCOUNTER — Ambulatory Visit (INDEPENDENT_AMBULATORY_CARE_PROVIDER_SITE_OTHER): Payer: 59 | Admitting: Cardiology

## 2012-09-21 VITALS — BP 126/90 | HR 43 | Ht 63.0 in | Wt 271.0 lb

## 2012-09-21 DIAGNOSIS — I2510 Atherosclerotic heart disease of native coronary artery without angina pectoris: Secondary | ICD-10-CM

## 2012-09-21 DIAGNOSIS — I5032 Chronic diastolic (congestive) heart failure: Secondary | ICD-10-CM

## 2012-09-21 DIAGNOSIS — E785 Hyperlipidemia, unspecified: Secondary | ICD-10-CM

## 2012-09-21 DIAGNOSIS — R0989 Other specified symptoms and signs involving the circulatory and respiratory systems: Secondary | ICD-10-CM

## 2012-09-21 DIAGNOSIS — R0602 Shortness of breath: Secondary | ICD-10-CM

## 2012-09-21 DIAGNOSIS — R079 Chest pain, unspecified: Secondary | ICD-10-CM

## 2012-09-21 DIAGNOSIS — I679 Cerebrovascular disease, unspecified: Secondary | ICD-10-CM

## 2012-09-21 DIAGNOSIS — R0609 Other forms of dyspnea: Secondary | ICD-10-CM

## 2012-09-21 DIAGNOSIS — I509 Heart failure, unspecified: Secondary | ICD-10-CM

## 2012-09-21 DIAGNOSIS — I10 Essential (primary) hypertension: Secondary | ICD-10-CM

## 2012-09-21 LAB — LIPID PANEL
Cholesterol: 224 mg/dL — ABNORMAL HIGH (ref 0–200)
HDL: 39.6 mg/dL (ref >39.00)
Total CHOL/HDL Ratio: 6
Triglycerides: 218 mg/dL — ABNORMAL HIGH (ref 0.0–149.0)
VLDL: 43.6 mg/dL — ABNORMAL HIGH (ref 0.0–40.0)

## 2012-09-21 LAB — BRAIN NATRIURETIC PEPTIDE: Pro B Natriuretic peptide (BNP): 249 pg/mL — ABNORMAL HIGH (ref 0.0–100.0)

## 2012-09-21 LAB — LDL CHOLESTEROL, DIRECT: Direct LDL: 166.6 mg/dL

## 2012-09-21 MED ORDER — FUROSEMIDE 40 MG PO TABS: ORAL_TABLET | ORAL | Status: DC

## 2012-09-21 MED ORDER — POTASSIUM CHLORIDE CRYS ER 20 MEQ PO TBCR: 20.0000 meq | EXTENDED_RELEASE_TABLET | Freq: Every day | ORAL | Status: DC

## 2012-09-21 MED ORDER — AMLODIPINE BESYLATE 5 MG PO TABS: 5.0000 mg | ORAL_TABLET | Freq: Every day | ORAL | Status: DC

## 2012-09-21 NOTE — Patient Instructions (Addendum)
Take lasix (furosemide) 40 mg two times a day for 3 days, then take lasix 40mg  in the AM and 20mg  in the PM (1/2 of a 40mg  tablet).  Start KCL(potassium) 20 mEq daily.  Start amlodipine (norvasc) 5mg  daily.  Your physician has requested that you have an echocardiogram. Echocardiography is a painless test that uses sound waves to create images of your heart. It provides your doctor with information about the size and shape of your heart and how well your heart's chambers and valves are working. This procedure takes approximately one hour. There are no restrictions for this procedure.  Your physician has requested that you have a lexiscan myoview. For further information please visit [email or web address]. Please follow instruction sheet, as given.    Your physician recommends that you schedule a follow-up appointment with Dr [name] on Wednesday September 3,2014 at 12:30PM.

## 2012-09-21 NOTE — Telephone Encounter (Signed)
rec'd records from [facility] to Dr.Mclean

## 2012-09-23 DIAGNOSIS — I5032 Chronic diastolic (congestive) heart failure: Secondary | ICD-10-CM | POA: Insufficient documentation

## 2012-09-23 NOTE — Progress Notes (Signed)
Patient ID: [name] [name], female   DOB: [birth year], 67 y.o.   [name]: [id number] PCP: Dr. Sandi Mariscal  67 yo with history of CAD s/p CABG, diastolic CHF, and cerebrovascular disease with history of CVA presents for cardiology followup.  She has been seen by Dr. [name] in the past and sees me for the first time today.  I have reviewed all her old records.  She had CABG x 5 in 12/11 and has not had a cardiac catheterization since that time.  Prior to the CABG she had multiple PCIs.  She had a CVA in 2/14 that presented as visual loss.  She ended up getting a cerebral angiogram in 6/14 with 70% ostial right vertebral stenosis, > 70% stenosis proximal left posterior cerebral artery, and 123456 LICA stenosis.  She has been getting periodic episodes of vertigo, question is whether this is related to her cerebrovascular disease. She is seeing Dr. Estanislado Pandy to decide whether intervention is needed.   For the last 3-4 weeks, patient has noted extreme fatigue.  She has diaphoresis just walking around a room at home.  She has been short of breath with exertion significantly since at least the beginning of this year.  She is dyspneic after 50 yards or walking up 1/2 flight of steps.  Last echo in 11/13 showed EF 50-55% with mild to moderate MR.  She additionally has been getting chest pain with exertion for the last 3-4 weeks.  This manifests itself as heaviness walking up a hill (such as driveway).  It occurs 3-4 times a week and resolves with rest.  She has had a 15-20 lb weight gain as well over the past 4-5 weeks.  She has not been exercising.  BP continues to run high at times.  She frequently uses hydralazine as a prn medication for elevated BP.    Labs (6/14): K 3.8, creatinine 0.71  PMH: 1. Diabetic gastroparesis 2. Type II diabetes 3. HTN 4. Morbid obesity 5. CAD: s/p CABG in 12/11 after prior PCIs.  LIMA-LAD, SVG-D, seq SVG-OM1 and OM2, SVG-PDA.  6. Atypical migraines 7. OSA: Intolerant of CPAP.  8. GERD  with hiatal hernia 9. OA 10. Diastolic CHF: Echo (A999333) with EF 50-55%, grade II diastolic dysfunction, mild-moderate MR. 11. CKD 12. Chronic LBBB 13. Anxiety 14. Carotid stenosis: Followed by [facility], A999333 LICA stenosis.  15. Cerebrovascular disease: CVA 2/14 with right posterior cerebral artery territory ischemic infarction. Cerebral angiogram in 6/14 showed 70% right vertebral ostial stenosis, 123456 LICA stenosis, > [id number] proximal left posterior cerebral artery stenosis, posterior communicating artery aneurysm.   SH: Married, nonsmoker  FH: CAD  ROS: All systems reviewed and negative except as per HPI.   Current Outpatient Prescriptions  Medication Sig Dispense Refill  . albuterol (PROVENTIL) (2.5 MG/3ML) 0.083% nebulizer solution Take 2.5 mg by nebulization every 6 (six) hours as needed. For shortness of breath.      Marland Kitchen albuterol (PROVENTIL,VENTOLIN) 90 MCG/ACT inhaler Inhale 2 puffs into the lungs every 6 (six) hours as needed. For shortness of breath.      . ALPRAZolam (XANAX) 0.5 MG tablet Take 0.5 mg by mouth See admin instructions. For anxiety - Patient takes 1 in the morning and 1 in the evening daily. If needed she may take one in the afternoon.      Marland Kitchen aspirin EC 81 MG tablet Take 81 mg by mouth daily.      . B Complex-C (B-COMPLEX WITH VITAMIN C) tablet Take 1 tablet by mouth daily.      Marland Kitchen  Calcium 1500 MG tablet Take 1,500 mg by mouth daily.       . Cholecalciferol (VITAMIN D) 2000 UNITS tablet Take 2,000 Units by mouth daily.      . cloNIDine (CATAPRES) 0.2 MG tablet Take 0.2 mg by mouth 2 (two) times daily.      . clopidogrel (PLAVIX) 75 MG tablet Take 75 mg by mouth daily.      Marland Kitchen FLUoxetine (PROZAC) 20 MG capsule Take 20 mg by mouth daily.      . Fluticasone-Salmeterol (ADVAIR DISKUS) 250-50 MCG/DOSE AEPB Inhale 1 puff into the lungs every 12 (twelve) hours as needed. For asthma symptoms      . gabapentin (NEURONTIN) 600 MG tablet Take 600 mg by mouth at bedtime.       Marland Kitchen  HUMALOG KWIKPEN 100 UNIT/ML SOPN as directed.      . hydrALAZINE (APRESOLINE) 50 MG tablet Take 50 mg by mouth 3 (three) times daily as needed (for high blood pressure). Does not take dose if BP low      . HYDROcodone-acetaminophen (NORCO/VICODIN) 5-325 MG per tablet Take 1 tablet by mouth every 6 (six) hours as needed for pain.      Marland Kitchen insulin glargine (LANTUS) 100 UNIT/ML injection Inject 15-50 Units into the skin 2 (two) times daily. 50 units in the morning; in the evening uses a sliding scale (if greater than 150 uses 15-20 units, greater than 200 uses 35-40 units)      . losartan (COZAAR) 100 MG tablet Take 100 mg by mouth 2 (two) times daily.       . Meclizine HCl (BONINE) 25 MG CHEW Chew 25 mg by mouth every 6 (six) hours as needed (dizziness).       . metoprolol (LOPRESSOR) 100 MG tablet Take 50 mg by mouth 2 (two) times daily.      . Multiple Vitamin (MULTIVITAMIN) tablet Take 1 tablet by mouth daily.        . niacin (NIASPAN) 1000 MG CR tablet Take 1,000 mg by mouth at bedtime.      . nitroGLYCERIN (NITROSTAT) 0.3 MG SL tablet Place 0.3 mg under the tongue every 5 (five) minutes x 3 doses as needed for chest pain.       Marland Kitchen omeprazole (PRILOSEC) 20 MG capsule Take 20 mg by mouth daily with breakfast.       . ondansetron (ZOFRAN) 4 MG tablet Take 4 mg by mouth 2 (two) times daily as needed. For nausea       . ONE TOUCH ULTRA TEST test strip       . rosuvastatin (CRESTOR) 40 MG tablet Take 40 mg by mouth daily.       Marland Kitchen topiramate (TOPAMAX) 50 MG tablet Take 50 mg by mouth 2 (two) times daily.      . vitamin B-12 (CYANOCOBALAMIN) 1000 MCG tablet Take 1,000 mcg by mouth daily.      Marland Kitchen zolpidem (AMBIEN) 10 MG tablet Take 10 mg by mouth at bedtime as needed for sleep.       Marland Kitchen amLODipine (NORVASC) 5 MG tablet Take 1 tablet (5 mg total) by mouth daily.  30 tablet  3  . buPROPion (WELLBUTRIN XL) 150 MG 24 hr tablet Take 150 mg by mouth daily.      . furosemide (LASIX) 40 MG tablet Take 1 tablet two  times a day for 3 days, then take 1 tablet in the AM and 1/2 tablet (total 20mg ) in the PM  60 tablet  3  . potassium chloride SA (KLOR-CON M20) 20 MEQ tablet Take 1 tablet (20 mEq total) by mouth daily.  30 tablet  3   No current facility-administered medications for this visit.    BP 126/90  Pulse 43  Ht 5\' 3"  (1.6 m)  Wt 122.925 kg (271 lb)  BMI 48.02 kg/m2  SpO2 97% General: NAD, obese Neck: JVP 8-9 cm, no thyromegaly or thyroid nodule.  Lungs: Occasional squeaks noted on lung exam.  CV: Nondisplaced PMI.  Heart regular S1/S2 with wide S2 split, no S3/S4, no murmur.  1+ edema 1/2 up lower legs bilaterally.  No carotid bruit.  Normal pedal pulses.  Abdomen: Soft, nontender, no hepatosplenomegaly, no distention.  Skin: Intact without lesions or rashes.  Neurologic: Alert and oriented x 3.  Psych: Normal affect. Extremities: No clubbing or cyanosis.   Assessment/Plan: 1. CAD: S/p CABG in 2011.  Patient does report relatively new exertional chest pain and exertional dyspnea.   - Lexiscan Cardiolite for risk stratification.  - Continue ASA 81, statin, Plavix 75, losartan, metoprolol, Crestor.  2. Chronic diastolic CHF: Patient is volume overloaded on exam with NYHA class III symptoms.  Some of her dyspnea is likely due to obesity and deconditioning, but I think that she needs more diuresis as well.   - BNP today.  - Increase Lasix to 40 mg bid x 3 days, then to 40 qam, 20 qpm.  Add KCl 20 mEq daily. BMET/BNP in 2 wks.  - Echocardiogram 3. Hyperlipidemia: Continue Crestor, check lipids.  4. Hypertension: BP continues to run high often and she is taking prn hydralazine fairly frequently.  I would like to have her avoid prn use of BP meds.  I am going to start amlodipine 5 mg daily.  5. Cerebrovascular disease: She has history of CVA.  She is on Plavix.  She will followup with neurology/Dr. Estanislado Pandy.   Followup in 2 wks.   Loralie Champagne 09/23/2012

## 2012-09-26 ENCOUNTER — Ambulatory Visit (HOSPITAL_COMMUNITY): Payer: 59 | Attending: Cardiology | Admitting: Radiology

## 2012-09-26 VITALS — BP 179/62 | HR 41 | Ht 62.0 in | Wt 267.0 lb

## 2012-09-26 DIAGNOSIS — R0609 Other forms of dyspnea: Secondary | ICD-10-CM | POA: Insufficient documentation

## 2012-09-26 DIAGNOSIS — I447 Left bundle-branch block, unspecified: Secondary | ICD-10-CM

## 2012-09-26 DIAGNOSIS — I5032 Chronic diastolic (congestive) heart failure: Secondary | ICD-10-CM

## 2012-09-26 DIAGNOSIS — R0789 Other chest pain: Secondary | ICD-10-CM | POA: Insufficient documentation

## 2012-09-26 DIAGNOSIS — R000 Tachycardia, unspecified: Secondary | ICD-10-CM | POA: Insufficient documentation

## 2012-09-26 DIAGNOSIS — R5383 Other fatigue: Secondary | ICD-10-CM | POA: Insufficient documentation

## 2012-09-26 DIAGNOSIS — R0989 Other specified symptoms and signs involving the circulatory and respiratory systems: Secondary | ICD-10-CM | POA: Insufficient documentation

## 2012-09-26 DIAGNOSIS — I10 Essential (primary) hypertension: Secondary | ICD-10-CM

## 2012-09-26 DIAGNOSIS — Z951 Presence of aortocoronary bypass graft: Secondary | ICD-10-CM | POA: Insufficient documentation

## 2012-09-26 DIAGNOSIS — R079 Chest pain, unspecified: Secondary | ICD-10-CM

## 2012-09-26 DIAGNOSIS — R42 Dizziness and giddiness: Secondary | ICD-10-CM | POA: Insufficient documentation

## 2012-09-26 DIAGNOSIS — E785 Hyperlipidemia, unspecified: Secondary | ICD-10-CM

## 2012-09-26 DIAGNOSIS — R0602 Shortness of breath: Secondary | ICD-10-CM

## 2012-09-26 DIAGNOSIS — R002 Palpitations: Secondary | ICD-10-CM | POA: Insufficient documentation

## 2012-09-26 DIAGNOSIS — R5381 Other malaise: Secondary | ICD-10-CM | POA: Insufficient documentation

## 2012-09-26 DIAGNOSIS — I2510 Atherosclerotic heart disease of native coronary artery without angina pectoris: Secondary | ICD-10-CM

## 2012-09-26 DIAGNOSIS — I679 Cerebrovascular disease, unspecified: Secondary | ICD-10-CM

## 2012-09-26 DIAGNOSIS — I252 Old myocardial infarction: Secondary | ICD-10-CM | POA: Insufficient documentation

## 2012-09-26 DIAGNOSIS — R61 Generalized hyperhidrosis: Secondary | ICD-10-CM | POA: Insufficient documentation

## 2012-09-26 MED ORDER — ADENOSINE (DIAGNOSTIC) 3 MG/ML IV SOLN
0.5600 mg/kg | Freq: Once | INTRAVENOUS | Status: AC
  Administered 2012-09-26: 67.8 mg via INTRAVENOUS

## 2012-09-26 MED ORDER — TECHNETIUM TC 99M SESTAMIBI GENERIC - CARDIOLITE
33.0000 | Freq: Once | INTRAVENOUS | Status: AC | PRN
  Administered 2012-09-26: 33 via INTRAVENOUS

## 2012-09-26 NOTE — Progress Notes (Addendum)
[name] [name], [place] [address] [phone]    Cardiology Nuclear Med Study  [name] [name] is a 67 y.o. female     [name] : [id number]     DOB: [birth year]  Procedure Date: 09/26/2012  Nuclear Med Background Indication for Stress Test:  Evaluation for Ischemia, PTCA/Stent and Graft Patency History:  Multiple Stents; Multiple MI's; '01 [id number], EF=62%; '11 CABG; '13 Echo:EF=55%, mild-moderate MR Cardiac Risk Factors: Carotid Disease, CVA, Family History - CAD, Hypertension, IDDM Type 2, LBBB, Lipids, Obesity and PVD  Symptoms:  Chest Pressure with and without Exertion (last episode of chest discomfort was last week), Diaphoresis, Dizziness, SOB/DOE, Fatigue, Palpitations and Rapid HR   Nuclear Pre-Procedure Caffeine/Decaff Intake:  12:00am NPO After: 8:00am   Lungs:  Clear. O2 Sat: 94% on room air. IV 0.9% NS with Angio Cath:  22g  IV Site: R Forearm  IV Started by:  Annye Rusk, CNMT  Chest Size (in):  38 Cup Size: D  Height: 5\' 2"  (1.575 m)  Weight:  267 lb (121.11 kg)  BMI:  Body mass index is 48.82 kg/(m^2). Tech Comments:  Lopressor taken at 0800,CBG 110,1/4 dose Insulin taken at 0800    Nuclear Med Study 1 or 2 day study: 2 day  Stress Test Type:  Adenosine  Reading MD: Loralie Champagne, MD  Order Authorizing Provider:  Jenkins Rouge, MD  Resting Radionuclide: Technetium 65m Sestamibi  Resting Radionuclide Dose: 33.0 mCi  09/28/12  Stress Radionuclide:  Technetium 44m Sestamibi  Stress Radionuclide Dose: 33.0 mCi  09/26/12          Stress Protocol Rest HR: 41 Stress HR: 46  Rest BP: 179/62 Stress BP: 169/67  Exercise Time (min): n/a METS: n/a   Predicted Max HR: 156 bpm % Max HR: 29.49 bpm Rate Pressure Product: 7774   Dose of Adenosine (mg):  60mg  Dose of Lexiscan: n/a mg  Dose of Atropine (mg): n/a Dose of Dobutamine: n/a mcg/kg/min (at max HR)  Stress Test Technologist: Letta Moynahan, CMA-N  Nuclear Technologist:   Charlton Amor, CNMT     Rest Procedure:  Myocardial perfusion imaging was performed at rest 45 minutes following the intravenous administration of Technetium 30m Sestamibi.  Rest ECG: Marked sinus bradycardia, RAD, LBBB.  Stress Procedure:  The patient received IV adenosine at 140 mcg/kg/min for 4 minutes.  She c/o chest pressure with infusion.  Technetium 21m Sestamibi was injected at the 2 minute mark and quantitative spect images were obtained after a 45 minute delay.  Stress ECG: Uninteretable due to baseline LBBB  QPS Raw Data Images:  Acquisition technically good; LVE. Stress Images:  There is decreased uptake in the apex and inferior wall. Rest Images:  There is decreased uptake in the basal inferior wall; inferior defect less prominent compared to the stress images. Subtraction (SDS):  These findings are consistent with mild apical ischemia; inferior thinning vs small prior infarct and mild inferior ischemia. Transient Ischemic Dilatation (Normal <1.22):  n/a Lung/Heart Ratio (Normal <0.45):  0.47  Quantitative Gated Spect Images QGS EDV:  167 ml QGS ESV:  78 ml  Impression Exercise Capacity:  Adenosine study with no exercise. BP Response:  Normal blood pressure response. Clinical Symptoms:  There is chest pain and dyspnea. ECG Impression:  Baseline:  LBBB.  EKG uninterpretable due to LBBB at rest and stress. Comparison with Prior Nuclear Study: No previous nuclear study performed  Overall Impression:  Low risk stress nuclear study with  a small, medium intensity, reversible apical defect consistent with mild ischemia; there is also a moderate size, medium intensity, partially reversible inferior defect consistent with small prior infarct vs thinning and mild inferior ischemia.  LV Ejection Fraction: 53%.  LV Wall Motion:  Akinesis of the basal inferior wall.  [name]   Overall low risk study but a couple of areas of ischemia.  Will need to see her back in the  office soon to see how her symptoms are doing.  If she is significantly symptomatic, would consider cardiac cath.   Loralie Champagne 09/29/2012

## 2012-09-28 ENCOUNTER — Ambulatory Visit (HOSPITAL_BASED_OUTPATIENT_CLINIC_OR_DEPARTMENT_OTHER): Payer: 59 | Admitting: Radiology

## 2012-09-28 ENCOUNTER — Ambulatory Visit (HOSPITAL_COMMUNITY): Payer: 59 | Attending: Cardiology

## 2012-09-28 DIAGNOSIS — R079 Chest pain, unspecified: Secondary | ICD-10-CM

## 2012-09-28 DIAGNOSIS — I5032 Chronic diastolic (congestive) heart failure: Secondary | ICD-10-CM

## 2012-09-28 DIAGNOSIS — E785 Hyperlipidemia, unspecified: Secondary | ICD-10-CM

## 2012-09-28 DIAGNOSIS — R0989 Other specified symptoms and signs involving the circulatory and respiratory systems: Secondary | ICD-10-CM

## 2012-09-28 DIAGNOSIS — R0602 Shortness of breath: Secondary | ICD-10-CM

## 2012-09-28 DIAGNOSIS — I679 Cerebrovascular disease, unspecified: Secondary | ICD-10-CM

## 2012-09-28 DIAGNOSIS — I2510 Atherosclerotic heart disease of native coronary artery without angina pectoris: Secondary | ICD-10-CM

## 2012-09-28 DIAGNOSIS — R0609 Other forms of dyspnea: Secondary | ICD-10-CM

## 2012-09-28 DIAGNOSIS — I10 Essential (primary) hypertension: Secondary | ICD-10-CM

## 2012-09-28 MED ORDER — TECHNETIUM TC 99M SESTAMIBI GENERIC - CARDIOLITE
33.0000 | Freq: Once | INTRAVENOUS | Status: AC | PRN
  Administered 2012-09-28: 33 via INTRAVENOUS

## 2012-09-28 NOTE — Progress Notes (Signed)
Echocardiogram performed.  

## 2012-09-29 ENCOUNTER — Other Ambulatory Visit: Payer: Self-pay | Admitting: *Deleted

## 2012-09-29 MED ORDER — ISOSORBIDE MONONITRATE ER 30 MG PO TB24: 30.0000 mg | ORAL_TABLET | Freq: Every day | ORAL | Status: DC

## 2012-10-04 ENCOUNTER — Encounter: Payer: Self-pay | Admitting: Cardiology

## 2012-10-04 ENCOUNTER — Encounter: Payer: Self-pay | Admitting: *Deleted

## 2012-10-04 ENCOUNTER — Ambulatory Visit (INDEPENDENT_AMBULATORY_CARE_PROVIDER_SITE_OTHER): Payer: 59 | Admitting: Cardiology

## 2012-10-04 VITALS — BP 150/70 | HR 59 | Ht 62.0 in | Wt 267.0 lb

## 2012-10-04 DIAGNOSIS — I5032 Chronic diastolic (congestive) heart failure: Secondary | ICD-10-CM

## 2012-10-04 DIAGNOSIS — R0602 Shortness of breath: Secondary | ICD-10-CM

## 2012-10-04 DIAGNOSIS — R0989 Other specified symptoms and signs involving the circulatory and respiratory systems: Secondary | ICD-10-CM

## 2012-10-04 DIAGNOSIS — I6350 Cerebral infarction due to unspecified occlusion or stenosis of unspecified cerebral artery: Secondary | ICD-10-CM

## 2012-10-04 DIAGNOSIS — I509 Heart failure, unspecified: Secondary | ICD-10-CM

## 2012-10-04 DIAGNOSIS — I2510 Atherosclerotic heart disease of native coronary artery without angina pectoris: Secondary | ICD-10-CM

## 2012-10-04 DIAGNOSIS — R0609 Other forms of dyspnea: Secondary | ICD-10-CM

## 2012-10-04 DIAGNOSIS — E785 Hyperlipidemia, unspecified: Secondary | ICD-10-CM

## 2012-10-04 DIAGNOSIS — I10 Essential (primary) hypertension: Secondary | ICD-10-CM

## 2012-10-04 DIAGNOSIS — R079 Chest pain, unspecified: Secondary | ICD-10-CM

## 2012-10-04 LAB — CBC WITH DIFFERENTIAL/PLATELET
Basophils Absolute: 0 10*3/uL (ref 0.0–0.1)
Basophils Relative: 0.4 % (ref 0.0–3.0)
Eosinophils Absolute: 0.1 10*3/uL (ref 0.0–0.7)
Eosinophils Relative: 1.4 % (ref 0.0–5.0)
HCT: 46.6 % — ABNORMAL HIGH (ref 36.0–46.0)
Hemoglobin: 16.1 g/dL — ABNORMAL HIGH (ref 12.0–15.0)
Lymphocytes Relative: 23 % (ref 12.0–46.0)
Lymphs Abs: 2.1 10*3/uL (ref 0.7–4.0)
MCHC: 34.5 g/dL (ref 30.0–36.0)
MCV: 90.3 fl (ref 78.0–100.0)
Monocytes Absolute: 0.6 10*3/uL (ref 0.1–1.0)
Monocytes Relative: 6.8 % (ref 3.0–12.0)
Neutro Abs: 6.2 10*3/uL (ref 1.4–7.7)
Neutrophils Relative %: 68.4 % (ref 43.0–77.0)
Platelets: 264 10*3/uL (ref 150.0–400.0)
RBC: 5.16 Mil/uL — ABNORMAL HIGH (ref 3.87–5.11)
RDW: 13.7 % (ref 11.5–14.6)
WBC: 9.1 10*3/uL (ref 4.5–10.5)

## 2012-10-04 LAB — BASIC METABOLIC PANEL
BUN: 25 mg/dL — ABNORMAL HIGH (ref 6–23)
CO2: 27 mEq/L (ref 19–32)
Calcium: 9.7 mg/dL (ref 8.4–10.5)
Chloride: 104 mEq/L (ref 96–112)
Creatinine, Ser: 0.7 mg/dL (ref 0.4–1.2)
GFR: 83.76 mL/min (ref >60.00)
Glucose, Bld: 194 mg/dL — ABNORMAL HIGH (ref 70–99)
Potassium: 4.7 mEq/L (ref 3.5–5.1)
Sodium: 137 mEq/L (ref 135–145)

## 2012-10-04 LAB — MAGNESIUM: Magnesium: 2.1 mg/dL (ref 1.5–2.5)

## 2012-10-04 LAB — BRAIN NATRIURETIC PEPTIDE: Pro B Natriuretic peptide (BNP): 104 pg/mL — ABNORMAL HIGH (ref 0.0–100.0)

## 2012-10-04 LAB — PROTIME-INR
INR: 1 ratio (ref 0.8–1.0)
Prothrombin Time: 10.4 s (ref 10.2–12.4)

## 2012-10-04 MED ORDER — AMLODIPINE BESYLATE 10 MG PO TABS: 10.0000 mg | ORAL_TABLET | Freq: Every day | ORAL | Status: DC

## 2012-10-04 NOTE — Progress Notes (Signed)
Patient ID: [name] [name], female   DOB: 09/26/1945, 67 y.o.   [name]: [id number] PCP: Dr. Sandi Mariscal  67 yo with history of CAD s/p CABG, diastolic CHF, and cerebrovascular disease with history of CVA presents for cardiology followup.  She has been seen by Dr. [name] in the past and sees me for the first time today.  I have reviewed all her old records.  She had CABG x 5 in 12/11 and has not had a cardiac catheterization since that time.  Prior to the CABG she had multiple PCIs.  She had a CVA in 2/14 that presented as visual loss.  She ended up getting a cerebral angiogram in 6/14 with 70% ostial right vertebral stenosis, > 70% stenosis proximal left posterior cerebral artery, and 123456 LICA stenosis.  She has been getting periodic episodes of vertigo, question is whether this is related to her cerebrovascular disease. She is seeing Dr. Estanislado Pandy to decide whether intervention is needed.   For the last 4-5 weeks, patient has noted extreme fatigue.  She has diaphoresis just walking around a room at home.  She has been short of breath with exertion significantly since at least the beginning of this year.  She is dyspneic after 50 yards or walking up 1/2 flight of steps. She additionally has been getting chest pain with exertion for the last 4-5 weeks.  This manifests itself as heaviness walking up a hill (such as driveway) or carrying the laundary basket.  It occurs 3-4 times a week and resolves with rest.  She has not been exercising.    At last appointment, she looked volume overloaded on exam, so I increased Lasix.  She says that this has helped with her lower extremity edema but has had little effect on her dyspnea.  Weight is down 4 lbs.  LDL was quite high when recently checked.  She says that she has been taking her Crestor as ordered.  Echo was done, showing normal LV systolic function.  Adenosine Cardiolite showed a small reversible apical defect and a medium, partially reversible inferior defect with EF  53%.  SBP still runs in the 150s-160s when she checks at home.   Labs (6/14): K 3.8, creatinine 0.71 Labs (8/14): BNP 249, LDL 166  PMH: 1. Diabetic gastroparesis 2. Type II diabetes 3. HTN 4. Morbid obesity 5. CAD: s/p CABG in 12/11 after prior PCIs.  LIMA-LAD, SVG-D, seq SVG-OM1 and OM2, SVG-PDA. Adenosine Cardiolite (8/14) with EF 53% and a small reversible apical defect with a medium, partially reversible inferior defect 6. Atypical migraines 7. OSA: Intolerant of CPAP.  8. GERD with hiatal hernia 9. OA 10. Diastolic CHF: Echo (A999333) with EF 50-55%, grade II diastolic dysfunction, mild-moderate MR.  Echo (8/14) with EF 55-60%, grade II diastolic dysfunction, mildly increased aortic valve gradient (mean 12 mmHg) but valve opens well, mild MR and mild RV dilation.  11. CKD 12. Chronic LBBB 13. Anxiety 14. Carotid stenosis: Followed by [facility], A999333 LICA stenosis.  15. Cerebrovascular disease: CVA 2/14 with right posterior cerebral artery territory ischemic infarction. Cerebral angiogram in 6/14 showed 70% right vertebral ostial stenosis, 123456 LICA stenosis, > [id number] proximal left posterior cerebral artery stenosis, posterior communicating artery aneurysm.   SH: Married, nonsmoker  FH: CAD  ROS: All systems reviewed and negative except as per HPI.   Current Outpatient Prescriptions  Medication Sig Dispense Refill  . albuterol (PROVENTIL) (2.5 MG/3ML) 0.083% nebulizer solution Take 2.5 mg by nebulization every 6 (six) hours as needed. For shortness  of breath.      Marland Kitchen albuterol (PROVENTIL,VENTOLIN) 90 MCG/ACT inhaler Inhale 2 puffs into the lungs every 6 (six) hours as needed. For shortness of breath.      . ALPRAZolam (XANAX) 0.5 MG tablet Take 0.5 mg by mouth See admin instructions. For anxiety - Patient takes 1 in the morning and 1 in the evening daily. If needed she may take one in the afternoon.      Marland Kitchen aspirin EC 81 MG tablet Take 81 mg by mouth daily.      . B Complex-C (B-COMPLEX  WITH VITAMIN C) tablet Take 1 tablet by mouth daily.      Marland Kitchen buPROPion (WELLBUTRIN XL) 150 MG 24 hr tablet Take 150 mg by mouth daily.      . Calcium 1500 MG tablet Take 1,500 mg by mouth daily.       . Cholecalciferol (VITAMIN D) 2000 UNITS tablet Take 2,000 Units by mouth daily.      . cloNIDine (CATAPRES) 0.2 MG tablet Take 0.2 mg by mouth 2 (two) times daily.      . clopidogrel (PLAVIX) 75 MG tablet Take 75 mg by mouth daily.      Marland Kitchen FLUoxetine (PROZAC) 20 MG capsule Take 20 mg by mouth daily.      . Fluticasone-Salmeterol (ADVAIR DISKUS) 250-50 MCG/DOSE AEPB Inhale 1 puff into the lungs every 12 (twelve) hours as needed. For asthma symptoms      . furosemide (LASIX) 40 MG tablet Take 1 tablet two times a day for 3 days, then take 1 tablet in the AM and 1/2 tablet (total 20mg ) in the PM  60 tablet  3  . gabapentin (NEURONTIN) 600 MG tablet Take 600 mg by mouth at bedtime.       Marland Kitchen HUMALOG KWIKPEN 100 UNIT/ML SOPN 10 Units as directed.       . hydrALAZINE (APRESOLINE) 50 MG tablet Take 50 mg by mouth 3 (three) times daily as needed (for high blood pressure). Does not take dose if BP low      . HYDROcodone-acetaminophen (NORCO/VICODIN) 5-325 MG per tablet Take 1 tablet by mouth every 6 (six) hours as needed for pain.      Marland Kitchen insulin glargine (LANTUS) 100 UNIT/ML injection Inject 15-50 Units into the skin 2 (two) times daily. 50 units in the morning; in the evening uses a sliding scale (if greater than 150 uses 15-20 units, greater than 200 uses 35-40 units)      . isosorbide mononitrate (IMDUR) 30 MG 24 hr tablet Take 1 tablet (30 mg total) by mouth daily.  30 tablet  3  . losartan (COZAAR) 100 MG tablet Take 100 mg by mouth 2 (two) times daily.       . Meclizine HCl (BONINE) 25 MG CHEW Chew 25 mg by mouth every 6 (six) hours as needed (dizziness).       . metoprolol (LOPRESSOR) 100 MG tablet Take 50 mg by mouth 2 (two) times daily.      . Multiple Vitamin (MULTIVITAMIN) tablet Take 1 tablet by mouth  daily.        . niacin (NIASPAN) 1000 MG CR tablet Take 1,000 mg by mouth at bedtime.      . nitroGLYCERIN (NITROSTAT) 0.3 MG SL tablet Place 0.3 mg under the tongue every 5 (five) minutes x 3 doses as needed for chest pain.       Marland Kitchen omeprazole (PRILOSEC) 20 MG capsule Take 20 mg by mouth daily with breakfast.       .  ondansetron (ZOFRAN) 4 MG tablet Take 4 mg by mouth 2 (two) times daily as needed. For nausea       . ONE TOUCH ULTRA TEST test strip       . potassium chloride SA (KLOR-CON M20) 20 MEQ tablet Take 1 tablet (20 mEq total) by mouth daily.  30 tablet  3  . rosuvastatin (CRESTOR) 40 MG tablet Take 40 mg by mouth daily.       Marland Kitchen topiramate (TOPAMAX) 50 MG tablet Take 50 mg by mouth 2 (two) times daily.      . vitamin B-12 (CYANOCOBALAMIN) 1000 MCG tablet Take 1,000 mcg by mouth daily.      Marland Kitchen zolpidem (AMBIEN) 10 MG tablet Take 10 mg by mouth at bedtime as needed for sleep.       Marland Kitchen amLODipine (NORVASC) 10 MG tablet Take 1 tablet (10 mg total) by mouth daily.  30 tablet  6  . Magnesium Oxide 200 MG TABS Take 1 tablet (200 mg total) by mouth.       No current facility-administered medications for this visit.    BP 150/70  Pulse 59  Ht 5\' 2"  (1.575 m)  Wt 121.11 kg (267 lb)  BMI 48.82 kg/m2  SpO2 93% General: NAD, obese Neck: No JVD, no thyromegaly or thyroid nodule.  Lungs: Occasional squeaks noted on lung exam.  CV: Nondisplaced PMI.  Heart regular S1/S2 with wide S2 split, no S3/S4, 2/6 early SEM.  1+ edema 1/4 up lower legs bilaterally.  No carotid bruit.  Normal pedal pulses.  Abdomen: Soft, nontender, no hepatosplenomegaly, no distention.  Skin: Intact without lesions or rashes.  Neurologic: Alert and oriented x 3.  Psych: Normal affect. Extremities: No clubbing or cyanosis.   Assessment/Plan: 1. CAD: S/p CABG in 2011.  Patient does report relatively new exertional chest pain and exertional dyspnea.  I reviewed the Adenosine Cardiolite.  It appears to be a moderate risk  study with a medium, partially reversible inferior defect and a small, reversible apical perfusion defect . - Continue ASA 81, statin, Plavix 75, losartan, metoprolol, Imdur, Crestor.  - Given moderate risk study and ongoing exertional symptoms despite Imdur, metoprolol, and amlodipine, I will arrange for left heart cath.  2. Chronic diastolic CHF: Volume status looks better on exam but she has NYHA class III symptoms still.  Some of her dyspnea is likely due to obesity and deconditioning.  Echo showed preserved EF.  - Continue current Lasix.  - Need BMET/BNP.  3. Hyperlipidemia: LDL was very high for Crestor 40 mg daily.  She is adamant that she has been taking the medication correctly.  LDL was not this high in the past. She is having it rechecked by her PCP soon, I asked her to make sure a copy comes here.  4. Hypertension: BP continues to run high often.  Increase amlodipine to 5 mg daily.  She is taking hydralazine 50 mg tid standing rather than prn. 5. Cerebrovascular disease: She has history of CVA.  She is on Plavix.  She will followup with neurology/Dr. Estanislado Pandy.    Loralie Champagne 10/04/2012

## 2012-10-04 NOTE — Patient Instructions (Addendum)
Increase amlodipine to 10mg  daily. You can take 2 of your 5mg  tablets daily at the same time and use your current supply.  Start magnesium oxide 200mg  daily.   Your physician recommends that you have  lab work today--BMET/BNP/Magnesium level/CBCd/PT/INR   Your physician has requested that you have a cardiac catheterization. Cardiac catheterization is used to diagnose and/or treat various heart conditions. Doctors may recommend this procedure for a number of different reasons. The most common reason is to evaluate chest pain. Chest pain can be a symptom of coronary artery disease (CAD), and cardiac catheterization can show whether plaque is narrowing or blocking your heart's arteries. This procedure is also used to evaluate the valves, as well as measure the blood flow and oxygen levels in different parts of your heart. For further information please visit [email or web address]. Please follow instruction sheet, as given. Tuesday September 9,2014  Your physician recommends that you schedule a follow-up appointment in: 3 weeks with Dr [name].

## 2012-10-10 ENCOUNTER — Inpatient Hospital Stay (HOSPITAL_BASED_OUTPATIENT_CLINIC_OR_DEPARTMENT_OTHER)
Admission: RE | Admit: 2012-10-10 | Discharge: 2012-10-10 | Disposition: A | Discharge status: Home / Self Care | Payer: 59 | Source: Ambulatory Visit | Attending: Cardiology | Admitting: Cardiology

## 2012-10-10 ENCOUNTER — Other Ambulatory Visit: Payer: Self-pay | Admitting: Cardiology

## 2012-10-10 ENCOUNTER — Encounter (HOSPITAL_BASED_OUTPATIENT_CLINIC_OR_DEPARTMENT_OTHER)
Admission: RE | Disposition: A | Discharge status: Home / Self Care | Payer: Self-pay | Source: Ambulatory Visit | Attending: Cardiology

## 2012-10-10 DIAGNOSIS — I2510 Atherosclerotic heart disease of native coronary artery without angina pectoris: Secondary | ICD-10-CM | POA: Insufficient documentation

## 2012-10-10 DIAGNOSIS — I25810 Atherosclerosis of coronary artery bypass graft(s) without angina pectoris: Secondary | ICD-10-CM | POA: Insufficient documentation

## 2012-10-10 DIAGNOSIS — I209 Angina pectoris, unspecified: Secondary | ICD-10-CM | POA: Insufficient documentation

## 2012-10-10 DIAGNOSIS — I2582 Chronic total occlusion of coronary artery: Secondary | ICD-10-CM | POA: Insufficient documentation

## 2012-10-10 HISTORY — PX: CARDIAC CATHETERIZATION: SHX172

## 2012-10-10 LAB — POCT I-STAT GLUCOSE
Glucose, Bld: 145 mg/dL — ABNORMAL HIGH (ref 70–99)
Operator id: 122531

## 2012-10-10 SURGERY — JV LEFT HEART CATHETERIZATION WITH CORONARY/GRAFT ANGIOGRAM
Anesthesia: Moderate Sedation

## 2012-10-10 SURGERY — JV LEFT HEART CATHETERIZATION WITH CORONARY ANGIOGRAM

## 2012-10-10 MED ORDER — SODIUM CHLORIDE 0.9 % IV SOLN: 1.0000 mL/kg/h | INTRAVENOUS | Status: DC

## 2012-10-10 MED ORDER — SODIUM CHLORIDE 0.9 % IJ SOLN: 3.0000 mL | Freq: Two times a day (BID) | INTRAMUSCULAR | Status: DC

## 2012-10-10 MED ORDER — ASPIRIN 81 MG PO CHEW
324.0000 mg | CHEWABLE_TABLET | ORAL | Status: AC
  Administered 2012-10-10: 243 mg via ORAL

## 2012-10-10 MED ORDER — ACETAMINOPHEN 325 MG PO TABS: 650.0000 mg | ORAL_TABLET | ORAL | Status: DC | PRN

## 2012-10-10 MED ORDER — SODIUM CHLORIDE 0.9 % IV SOLN: 250.0000 mL | INTRAVENOUS | Status: DC | PRN

## 2012-10-10 MED ORDER — SODIUM CHLORIDE 0.9 % IV SOLN: INTRAVENOUS | Status: AC

## 2012-10-10 MED ORDER — SODIUM CHLORIDE 0.9 % IJ SOLN: 3.0000 mL | INTRAMUSCULAR | Status: DC | PRN

## 2012-10-10 MED ORDER — ONDANSETRON HCL 4 MG/2ML IJ SOLN: 4.0000 mg | Freq: Four times a day (QID) | INTRAMUSCULAR | Status: DC | PRN

## 2012-10-10 NOTE — H&P (View-Only) (Signed)
Patient ID: [name] [name], female   DOB: February 12, 1945, 67 y.o.   [name]: [id number] PCP: Dr. Sandi Mariscal  67 yo with history of CAD s/p CABG, diastolic CHF, and cerebrovascular disease with history of CVA presents for cardiology followup.  She has been seen by Dr. [name] in the past and sees me for the first time today.  I have reviewed all her old records.  She had CABG x 5 in 12/11 and has not had a cardiac catheterization since that time.  Prior to the CABG she had multiple PCIs.  She had a CVA in 2/14 that presented as visual loss.  She ended up getting a cerebral angiogram in 6/14 with 70% ostial right vertebral stenosis, > 70% stenosis proximal left posterior cerebral artery, and 123456 LICA stenosis.  She has been getting periodic episodes of vertigo, question is whether this is related to her cerebrovascular disease. She is seeing Dr. Estanislado Pandy to decide whether intervention is needed.   For the last 4-5 weeks, patient has noted extreme fatigue.  She has diaphoresis just walking around a room at home.  She has been short of breath with exertion significantly since at least the beginning of this year.  She is dyspneic after 50 yards or walking up 1/2 flight of steps. She additionally has been getting chest pain with exertion for the last 4-5 weeks.  This manifests itself as heaviness walking up a hill (such as driveway) or carrying the laundary basket.  It occurs 3-4 times a week and resolves with rest.  She has not been exercising.    At last appointment, she looked volume overloaded on exam, so I increased Lasix.  She says that this has helped with her lower extremity edema but has had little effect on her dyspnea.  Weight is down 4 lbs.  LDL was quite high when recently checked.  She says that she has been taking her Crestor as ordered.  Echo was done, showing normal LV systolic function.  Adenosine Cardiolite showed a small reversible apical defect and a medium, partially reversible inferior defect with EF  53%.  SBP still runs in the 150s-160s when she checks at home.   Labs (6/14): K 3.8, creatinine 0.71 Labs (8/14): BNP 249, LDL 166  PMH: 1. Diabetic gastroparesis 2. Type II diabetes 3. HTN 4. Morbid obesity 5. CAD: s/p CABG in 12/11 after prior PCIs.  LIMA-LAD, SVG-D, seq SVG-OM1 and OM2, SVG-PDA. Adenosine Cardiolite (8/14) with EF 53% and a small reversible apical defect with a medium, partially reversible inferior defect 6. Atypical migraines 7. OSA: Intolerant of CPAP.  8. GERD with hiatal hernia 9. OA 10. Diastolic CHF: Echo (A999333) with EF 50-55%, grade II diastolic dysfunction, mild-moderate MR.  Echo (8/14) with EF 55-60%, grade II diastolic dysfunction, mildly increased aortic valve gradient (mean 12 mmHg) but valve opens well, mild MR and mild RV dilation.  11. CKD 12. Chronic LBBB 13. Anxiety 14. Carotid stenosis: Followed by [facility], A999333 LICA stenosis.  15. Cerebrovascular disease: CVA 2/14 with right posterior cerebral artery territory ischemic infarction. Cerebral angiogram in 6/14 showed 70% right vertebral ostial stenosis, 123456 LICA stenosis, > [id number] proximal left posterior cerebral artery stenosis, posterior communicating artery aneurysm.   SH: Married, nonsmoker  FH: CAD  ROS: All systems reviewed and negative except as per HPI.   Current Outpatient Prescriptions  Medication Sig Dispense Refill  . albuterol (PROVENTIL) (2.5 MG/3ML) 0.083% nebulizer solution Take 2.5 mg by nebulization every 6 (six) hours as needed. For shortness  of breath.      Marland Kitchen albuterol (PROVENTIL,VENTOLIN) 90 MCG/ACT inhaler Inhale 2 puffs into the lungs every 6 (six) hours as needed. For shortness of breath.      . ALPRAZolam (XANAX) 0.5 MG tablet Take 0.5 mg by mouth See admin instructions. For anxiety - Patient takes 1 in the morning and 1 in the evening daily. If needed she may take one in the afternoon.      Marland Kitchen aspirin EC 81 MG tablet Take 81 mg by mouth daily.      . B Complex-C (B-COMPLEX  WITH VITAMIN C) tablet Take 1 tablet by mouth daily.      Marland Kitchen buPROPion (WELLBUTRIN XL) 150 MG 24 hr tablet Take 150 mg by mouth daily.      . Calcium 1500 MG tablet Take 1,500 mg by mouth daily.       . Cholecalciferol (VITAMIN D) 2000 UNITS tablet Take 2,000 Units by mouth daily.      . cloNIDine (CATAPRES) 0.2 MG tablet Take 0.2 mg by mouth 2 (two) times daily.      . clopidogrel (PLAVIX) 75 MG tablet Take 75 mg by mouth daily.      Marland Kitchen FLUoxetine (PROZAC) 20 MG capsule Take 20 mg by mouth daily.      . Fluticasone-Salmeterol (ADVAIR DISKUS) 250-50 MCG/DOSE AEPB Inhale 1 puff into the lungs every 12 (twelve) hours as needed. For asthma symptoms      . furosemide (LASIX) 40 MG tablet Take 1 tablet two times a day for 3 days, then take 1 tablet in the AM and 1/2 tablet (total 20mg ) in the PM  60 tablet  3  . gabapentin (NEURONTIN) 600 MG tablet Take 600 mg by mouth at bedtime.       Marland Kitchen HUMALOG KWIKPEN 100 UNIT/ML SOPN 10 Units as directed.       . hydrALAZINE (APRESOLINE) 50 MG tablet Take 50 mg by mouth 3 (three) times daily as needed (for high blood pressure). Does not take dose if BP low      . HYDROcodone-acetaminophen (NORCO/VICODIN) 5-325 MG per tablet Take 1 tablet by mouth every 6 (six) hours as needed for pain.      Marland Kitchen insulin glargine (LANTUS) 100 UNIT/ML injection Inject 15-50 Units into the skin 2 (two) times daily. 50 units in the morning; in the evening uses a sliding scale (if greater than 150 uses 15-20 units, greater than 200 uses 35-40 units)      . isosorbide mononitrate (IMDUR) 30 MG 24 hr tablet Take 1 tablet (30 mg total) by mouth daily.  30 tablet  3  . losartan (COZAAR) 100 MG tablet Take 100 mg by mouth 2 (two) times daily.       . Meclizine HCl (BONINE) 25 MG CHEW Chew 25 mg by mouth every 6 (six) hours as needed (dizziness).       . metoprolol (LOPRESSOR) 100 MG tablet Take 50 mg by mouth 2 (two) times daily.      . Multiple Vitamin (MULTIVITAMIN) tablet Take 1 tablet by mouth  daily.        . niacin (NIASPAN) 1000 MG CR tablet Take 1,000 mg by mouth at bedtime.      . nitroGLYCERIN (NITROSTAT) 0.3 MG SL tablet Place 0.3 mg under the tongue every 5 (five) minutes x 3 doses as needed for chest pain.       Marland Kitchen omeprazole (PRILOSEC) 20 MG capsule Take 20 mg by mouth daily with breakfast.       .  ondansetron (ZOFRAN) 4 MG tablet Take 4 mg by mouth 2 (two) times daily as needed. For nausea       . ONE TOUCH ULTRA TEST test strip       . potassium chloride SA (KLOR-CON M20) 20 MEQ tablet Take 1 tablet (20 mEq total) by mouth daily.  30 tablet  3  . rosuvastatin (CRESTOR) 40 MG tablet Take 40 mg by mouth daily.       Marland Kitchen topiramate (TOPAMAX) 50 MG tablet Take 50 mg by mouth 2 (two) times daily.      . vitamin B-12 (CYANOCOBALAMIN) 1000 MCG tablet Take 1,000 mcg by mouth daily.      Marland Kitchen zolpidem (AMBIEN) 10 MG tablet Take 10 mg by mouth at bedtime as needed for sleep.       Marland Kitchen amLODipine (NORVASC) 10 MG tablet Take 1 tablet (10 mg total) by mouth daily.  30 tablet  6  . Magnesium Oxide 200 MG TABS Take 1 tablet (200 mg total) by mouth.       No current facility-administered medications for this visit.    BP 150/70  Pulse 59  Ht 5\' 2"  (1.575 m)  Wt 121.11 kg (267 lb)  BMI 48.82 kg/m2  SpO2 93% General: NAD, obese Neck: No JVD, no thyromegaly or thyroid nodule.  Lungs: Occasional squeaks noted on lung exam.  CV: Nondisplaced PMI.  Heart regular S1/S2 with wide S2 split, no S3/S4, 2/6 early SEM.  1+ edema 1/4 up lower legs bilaterally.  No carotid bruit.  Normal pedal pulses.  Abdomen: Soft, nontender, no hepatosplenomegaly, no distention.  Skin: Intact without lesions or rashes.  Neurologic: Alert and oriented x 3.  Psych: Normal affect. Extremities: No clubbing or cyanosis.   Assessment/Plan: 1. CAD: S/p CABG in 2011.  Patient does report relatively new exertional chest pain and exertional dyspnea.  I reviewed the Adenosine Cardiolite.  It appears to be a moderate risk  study with a medium, partially reversible inferior defect and a small, reversible apical perfusion defect . - Continue ASA 81, statin, Plavix 75, losartan, metoprolol, Imdur, Crestor.  - Given moderate risk study and ongoing exertional symptoms despite Imdur, metoprolol, and amlodipine, I will arrange for left heart cath.  2. Chronic diastolic CHF: Volume status looks better on exam but she has NYHA class III symptoms still.  Some of her dyspnea is likely due to obesity and deconditioning.  Echo showed preserved EF.  - Continue current Lasix.  - Need BMET/BNP.  3. Hyperlipidemia: LDL was very high for Crestor 40 mg daily.  She is adamant that she has been taking the medication correctly.  LDL was not this high in the past. She is having it rechecked by her PCP soon, I asked her to make sure a copy comes here.  4. Hypertension: BP continues to run high often.  Increase amlodipine to 5 mg daily.  She is taking hydralazine 50 mg tid standing rather than prn. 5. Cerebrovascular disease: She has history of CVA.  She is on Plavix.  She will followup with neurology/Dr. Estanislado Pandy.    Loralie Champagne 10/04/2012

## 2012-10-10 NOTE — OR Nursing (Signed)
Tegaderm dressing applied, site level 0, bedrest begins at 1050

## 2012-10-10 NOTE — OR Nursing (Signed)
Discharge instructions reviewed and signed, pt stated understanding, ambulated in hall without difficulty, site level 0, transported to husband's car via wheelchair 

## 2012-10-10 NOTE — OR Nursing (Signed)
Dr McLean at bedside to discuss results and treatment plan with pt and family 

## 2012-10-10 NOTE — OR Nursing (Signed)
Meal served 

## 2012-10-10 NOTE — Interval H&P Note (Signed)
History and Physical Interval Note:  10/10/2012 9:51 AM  [name] [name]  has presented today for surgery, with the diagnosis of cp  The various methods of treatment have been discussed with the patient and family. After consideration of risks, benefits and other options for treatment, the patient has consented to  Procedure(s): JV LEFT HEART CATHETERIZATION WITH CORONARY ANGIOGRAM (N/A) as a surgical intervention .  The patient's history has been reviewed, patient examined, no change in status, stable for surgery.  I have reviewed the patient's chart and labs.  Questions were answered to the patient's satisfaction.     Jaye Saal [employer]

## 2012-10-10 NOTE — Progress Notes (Signed)
16Fr 10cc Balloon foley inserted and received clear, yellow urine.  Tolerated well

## 2012-10-10 NOTE — CV Procedure (Signed)
   Cardiac Catheterization Procedure Note  Name: [name] [name] [name]: [id number] DOB: 11/10/1945  Procedure: Left Heart Cath, Selective Coronary Angiography, LV angiography, SVG angiography, LIMA angiography  Indication:  CCS class III angina with intermediate risk stress test.    Procedural details: The right groin was prepped, draped, and anesthetized with 1% lidocaine. Using modified Seldinger technique, a 5 French sheath was introduced into the right femoral artery. MP catheter and JR4 catheter were used for coronary/LIMA/SVG angiography and left ventriculography. Catheter exchanges were performed over a guidewire. There were no immediate procedural complications. The patient was transferred to the post catheterization recovery area for further monitoring.  Procedural Findings: Hemodynamics:  AO 148/59 LV 152/22   Coronary angiography: Coronary dominance: right  Left mainstem: Short, no obstructive disease.   Left anterior descending (LAD): 95% proximal LAD stenosis.  SVG-diagonal was patent.  LIMA-LAD patent.  Serial 50% distal LAD stenoses.   Left circumflex (LCx): 90% ostial small ramus.  40% proximal LCx stenosis.  Large OM with moderate diffuse disease  Two large branches of the OM are severely diseased proximally.  Sequential SVG to two OM branches was patent.   Right coronary artery (RCA): Proximal stents in the RCA.  The RCA was totally occluded in the ostium just before the stented segment. The SVG-RCA was totally occluded at the ostium.   Left ventriculography: Hand LV-gram done.  Overall EF normal 55%.  Suspect hypokinesis basal inferior wall.   Final Conclusions:   The medium-sized defect seen on Cardiolite likely corresponds to occlusion of the SVG-RCA.  Patient's native RCA was already known to be occluded from cath prior to CABG.  Other grafts are patent.  There is some diffuse disease in the distal LAD.  She will be medically managed.  Imdur has helped, will increase  to 60 mg daily.  I will need to see her back in 2 weeks.   Loralie Champagne 10/10/2012, 10:37 AM

## 2012-10-19 ENCOUNTER — Encounter: Payer: Self-pay | Admitting: *Deleted

## 2012-10-24 ENCOUNTER — Ambulatory Visit (INDEPENDENT_AMBULATORY_CARE_PROVIDER_SITE_OTHER): Payer: 59 | Admitting: Cardiology

## 2012-10-24 ENCOUNTER — Encounter: Payer: Self-pay | Admitting: Cardiology

## 2012-10-24 ENCOUNTER — Encounter: Payer: Self-pay | Admitting: Physician Assistant

## 2012-10-24 VITALS — BP 140/90 | Ht 62.0 in | Wt 272.8 lb

## 2012-10-24 DIAGNOSIS — I2510 Atherosclerotic heart disease of native coronary artery without angina pectoris: Secondary | ICD-10-CM

## 2012-10-24 DIAGNOSIS — I509 Heart failure, unspecified: Secondary | ICD-10-CM

## 2012-10-24 DIAGNOSIS — I5032 Chronic diastolic (congestive) heart failure: Secondary | ICD-10-CM

## 2012-10-24 DIAGNOSIS — I10 Essential (primary) hypertension: Secondary | ICD-10-CM

## 2012-10-24 DIAGNOSIS — E785 Hyperlipidemia, unspecified: Secondary | ICD-10-CM

## 2012-10-24 LAB — BASIC METABOLIC PANEL
BUN: 28 mg/dL — ABNORMAL HIGH (ref 6–23)
CO2: 26 mEq/L (ref 19–32)
Calcium: 9.1 mg/dL (ref 8.4–10.5)
Chloride: 107 mEq/L (ref 96–112)
Creatinine, Ser: 0.9 mg/dL (ref 0.4–1.2)
GFR: 70.41 mL/min (ref >60.00)
Glucose, Bld: 145 mg/dL — ABNORMAL HIGH (ref 70–99)
Potassium: 4.6 mEq/L (ref 3.5–5.1)
Sodium: 140 mEq/L (ref 135–145)

## 2012-10-24 LAB — MAGNESIUM: Magnesium: 2 mg/dL (ref 1.5–2.5)

## 2012-10-24 LAB — BRAIN NATRIURETIC PEPTIDE: Pro B Natriuretic peptide (BNP): 109 pg/mL — ABNORMAL HIGH (ref 0.0–100.0)

## 2012-10-24 MED ORDER — RANOLAZINE ER 500 MG PO TB12: 500.0000 mg | ORAL_TABLET | Freq: Two times a day (BID) | ORAL | Status: DC

## 2012-10-24 MED ORDER — CLOPIDOGREL BISULFATE 75 MG PO TABS: 75.0000 mg | ORAL_TABLET | Freq: Every day | ORAL | Status: DC

## 2012-10-24 NOTE — Patient Instructions (Signed)
Stop Imdur(isosorbide).   Start ranex 500mg  two times a day.   Take lasix (furosemide) 40mg  in the morning and 20mg  about 3-4 PM.   Your physician recommends that you have  lab work today--BMET/BNP/Magnesium level.   Your physician recommends that you schedule an appointment in 2 weeks for an EKG.  You have been referred to [facility].   Your physician recommends that you schedule a follow-up appointment in: 1 month with Dr [name].

## 2012-10-25 NOTE — Progress Notes (Signed)
Patient ID: [name] [name], female   DOB: 06/21/1945, 67 y.o.   [name]: [id number] PCP: Dr. Sandi Mariscal  67 yo with history of CAD s/p CABG, diastolic CHF, and cerebrovascular disease with history of CVA presents for cardiology followup. She had CABG x 5 in 12/11.  Prior to the CABG she had multiple PCIs.  She had a CVA in 2/14 that presented as visual loss.  She ended up getting a cerebral angiogram in 6/14 with 70% ostial right vertebral stenosis, > 70% stenosis proximal left posterior cerebral artery, and 123456 LICA stenosis.  She has been getting periodic episodes of vertigo, question is whether this is related to her cerebrovascular disease. She is seeing Dr. Estanislado Pandy to decide whether intervention is needed.   For the last several months, patient has noted extreme fatigue.  She has diaphoresis just walking around a room at home.  She has been short of breath with exertion significantly since at least the beginning of this year.  She is dyspneic after 50-100 yards or walking up 1/2 flight of steps. She additionally has been getting chest pain with exertion for several months.  This manifests itself as heaviness walking up a hill (such as driveway) or carrying the laundary basket.  It occurs 3-4 times a week and resolves with rest.  She has not been exercising.  Echo was done, showing normal LV systolic function.  Adenosine Cardiolite showed a small reversible apical defect and a medium, partially reversible inferior defect with EF 53%.    I took her for left heart catheterization.  This showed patent SVG-D, LIMA-LAD, and sequential SVG-OM branches but SVG-RCA and the native RCA were both totally occluded.  I suspect that her increased symptoms coincided with occlusion of SVG-RCA.  After appointment, I started her on Imdur to see if this would help with the chest pain and dyspnea.  However, she feels like Imdur causes leg cramps and does not think that she can take it.  She additionally has not been taking her  Lasix for the past few days because her mother has been in the hospital, and her weight is up 5 lbs.   ECG: NSR, LBBB  Labs (6/14): K 3.8, creatinine 0.71 Labs (8/14): BNP 249, LDL 166 Labs (9/14): K 4.7, creatinine 0.7  PMH: 1. Diabetic gastroparesis 2. Type II diabetes 3. HTN 4. Morbid obesity 5. CAD: s/p CABG in 12/11 after prior PCIs.  LIMA-LAD, SVG-D, seq SVG-OM1 and OM2, SVG-PDA. Adenosine Cardiolite (8/14) with EF 53% and a small reversible apical defect with a medium, partially reversible inferior defect.  LHC (9/14) with patent SVG-D, LIMA-LAD (50% distal LAD), and sequential SVG-OM branches; the SVG-RCA and the native RCA were occluded (medical management).  6. Atypical migraines 7. OSA: Intolerant of CPAP.  8. GERD with hiatal hernia 9. OA 10. Diastolic CHF: Echo (A999333) with EF 50-55%, grade II diastolic dysfunction, mild-moderate MR.  Echo (8/14) with EF 55-60%, grade II diastolic dysfunction, mildly increased aortic valve gradient (mean 12 mmHg) but valve opens well, mild MR and mild RV dilation.  11. CKD 12. Chronic LBBB 13. Anxiety 14. Carotid stenosis: Followed by [facility], A999333 LICA stenosis.  15. Cerebrovascular disease: CVA 2/14 with right posterior cerebral artery territory ischemic infarction. Cerebral angiogram in 6/14 showed 70% right vertebral ostial stenosis, 123456 LICA stenosis, > [id number] proximal left posterior cerebral artery stenosis, posterior communicating artery aneurysm.   SH: Married, nonsmoker  FH: CAD  ROS: All systems reviewed and negative except as per HPI.  Current Outpatient Prescriptions  Medication Sig Dispense Refill  . albuterol (PROVENTIL) (2.5 MG/3ML) 0.083% nebulizer solution Take 2.5 mg by nebulization every 6 (six) hours as needed. For shortness of breath.      Marland Kitchen albuterol (PROVENTIL,VENTOLIN) 90 MCG/ACT inhaler Inhale 2 puffs into the lungs every 6 (six) hours as needed. For shortness of breath.      . ALPRAZolam (XANAX) 0.5 MG tablet  Take 0.5 mg by mouth See admin instructions. For anxiety - Patient takes 1 in the morning and 1 in the evening daily. If needed she may take one in the afternoon.      Marland Kitchen amLODipine (NORVASC) 10 MG tablet Take 1 tablet (10 mg total) by mouth daily.  30 tablet  6  . aspirin EC 81 MG tablet Take 81 mg by mouth daily.      . B Complex-C (B-COMPLEX WITH VITAMIN C) tablet Take 1 tablet by mouth daily.      Marland Kitchen buPROPion (WELLBUTRIN XL) 150 MG 24 hr tablet Take 150 mg by mouth daily.      . Calcium 1500 MG tablet Take 1,500 mg by mouth daily.       . Cholecalciferol (VITAMIN D) 2000 UNITS tablet Take 2,000 Units by mouth daily.      . cloNIDine (CATAPRES) 0.2 MG tablet Take 0.2 mg by mouth 2 (two) times daily.      . clopidogrel (PLAVIX) 75 MG tablet Take 1 tablet (75 mg total) by mouth daily.  30 tablet  6  . FLUoxetine (PROZAC) 20 MG capsule Take 20 mg by mouth daily.      . Fluticasone-Salmeterol (ADVAIR DISKUS) 250-50 MCG/DOSE AEPB Inhale 1 puff into the lungs every 12 (twelve) hours as needed. For asthma symptoms      . furosemide (LASIX) 40 MG tablet Take 1 tablet two times a day for 3 days, then take 1 tablet in the AM and 1/2 tablet (total 20mg ) in the PM  60 tablet  3  . gabapentin (NEURONTIN) 600 MG tablet Take 600 mg by mouth at bedtime.       Marland Kitchen HUMALOG KWIKPEN 100 UNIT/ML SOPN 10 Units as directed.       . hydrALAZINE (APRESOLINE) 50 MG tablet Take 50 mg by mouth 3 (three) times daily as needed (for high blood pressure). Does not take dose if BP low      . HYDROcodone-acetaminophen (NORCO/VICODIN) 5-325 MG per tablet Take 1 tablet by mouth every 6 (six) hours as needed for pain.      Marland Kitchen insulin glargine (LANTUS) 100 UNIT/ML injection Inject 15-50 Units into the skin 2 (two) times daily. 50 units in the morning; in the evening uses a sliding scale (if greater than 150 uses 15-20 units, greater than 200 uses 35-40 units)      . losartan (COZAAR) 100 MG tablet Take 100 mg by mouth 2 (two) times  daily.       . Magnesium Oxide 200 MG TABS Take 1 tablet (200 mg total) by mouth.      . Meclizine HCl (BONINE) 25 MG CHEW Chew 25 mg by mouth every 6 (six) hours as needed (dizziness).       . metoprolol (LOPRESSOR) 100 MG tablet Take 100 mg by mouth 2 (two) times daily.       . Multiple Vitamin (MULTIVITAMIN) tablet Take 1 tablet by mouth daily.        . niacin (NIASPAN) 1000 MG CR tablet Take 1,000 mg by mouth at  bedtime.      . nitroGLYCERIN (NITROSTAT) 0.3 MG SL tablet Place 0.3 mg under the tongue every 5 (five) minutes x 3 doses as needed for chest pain.       Marland Kitchen omeprazole (PRILOSEC) 20 MG capsule Take 20 mg by mouth daily with breakfast.       . ondansetron (ZOFRAN) 4 MG tablet Take 4 mg by mouth 2 (two) times daily as needed. For nausea       . ONE TOUCH ULTRA TEST test strip       . potassium chloride SA (KLOR-CON M20) 20 MEQ tablet Take 1 tablet (20 mEq total) by mouth daily.  30 tablet  3  . rosuvastatin (CRESTOR) 40 MG tablet Take 40 mg by mouth daily.       Marland Kitchen topiramate (TOPAMAX) 50 MG tablet Take 50 mg by mouth 2 (two) times daily.      Marland Kitchen triamterene-hydrochlorothiazide (DYAZIDE) 37.5-25 MG per capsule Take 1 capsule by mouth as directed.      . vitamin B-12 (CYANOCOBALAMIN) 1000 MCG tablet Take 1,000 mcg by mouth daily.      Marland Kitchen zolpidem (AMBIEN) 10 MG tablet Take 10 mg by mouth at bedtime as needed for sleep.       . ranolazine (RANEXA) 500 MG 12 hr tablet Take 1 tablet (500 mg total) by mouth 2 (two) times daily.  60 tablet  6   No current facility-administered medications for this visit.    BP 140/90  Ht 5\' 2"  (1.575 m)  Wt 123.741 kg (272 lb 12.8 oz)  BMI 49.88 kg/m2 General: NAD, obese Neck: No JVD, no thyromegaly or thyroid nodule.  Lungs: Occasional squeaks noted on lung exam.  CV: Nondisplaced PMI.  Heart regular S1/S2 with wide S2 split, no S3/S4, 2/6 early SEM.  1+ edema at ankles bilaterally.  No carotid bruit.  Normal pedal pulses.  Abdomen: Soft, nontender, no  hepatosplenomegaly, no distention.  Skin: Intact without lesions or rashes.  Neurologic: Alert and oriented x 3.  Psych: Normal affect. Extremities: No clubbing or cyanosis. Right groin cath site benign.   Assessment/Plan: 1. CAD: Occluded native RCA and SVG-RCA.  The occlusion of SVG-RCA is the likely cause of her increased symptoms.  She has not tolerated Imdur as she thinks that it has caused cramping.  - Stop Imdur, start ranolazine 500 mg bid.  Will get ECG in 2 wks to follow QT interval.  - Continue ASA 81, statin, Plavix 75, losartan, metoprolol,  Crestor.  - I am going to arrange for her to do cardiac rehab.  2. Chronic diastolic CHF: She does not appear particularly volume overloaded on exam.  It is possible that her exertional dyspnea is an anginal equivalent.  She is, however, up 5 lbs and has not taken Lasix for several days.  I would like her to get back to taking Lasix 40 qam/20 qpm every day.  Will check BMET and Mg given cramps.  Will also check BNP.  3. Hyperlipidemia: LDL was very high for Crestor 40 mg daily when most recently checked.  She is adamant that she has been taking the medication correctly.  LDL was not this high in the past. She is having it rechecked by her PCP on 9/29, I asked her to make sure a copy comes here.  4. Hypertension: BP is improved.  Continue current regimen.  5. Cerebrovascular disease: She has history of CVA.  She is on Plavix.  She will followup with neurology/Dr.  Deveshwar.    Loralie Champagne 10/25/2012

## 2012-11-02 ENCOUNTER — Encounter: Payer: Self-pay | Admitting: Cardiology

## 2012-11-08 ENCOUNTER — Ambulatory Visit (INDEPENDENT_AMBULATORY_CARE_PROVIDER_SITE_OTHER): Payer: 59 | Admitting: *Deleted

## 2012-11-08 DIAGNOSIS — I5032 Chronic diastolic (congestive) heart failure: Secondary | ICD-10-CM

## 2012-11-08 DIAGNOSIS — I509 Heart failure, unspecified: Secondary | ICD-10-CM

## 2012-11-08 DIAGNOSIS — I2510 Atherosclerotic heart disease of native coronary artery without angina pectoris: Secondary | ICD-10-CM

## 2012-11-08 NOTE — Progress Notes (Signed)
EKG done and read by Dr Fredderick Phenix started on Ranexa 10/24/12 by Dr Karie Fetch OK per Dr [place].

## 2012-11-09 ENCOUNTER — Encounter (HOSPITAL_COMMUNITY)
Admission: RE | Admit: 2012-11-09 | Discharge: 2012-11-09 | Disposition: A | Discharge status: Home / Self Care | Payer: 59 | Source: Ambulatory Visit | Attending: Cardiology | Admitting: Cardiology

## 2012-11-09 DIAGNOSIS — I2510 Atherosclerotic heart disease of native coronary artery without angina pectoris: Secondary | ICD-10-CM | POA: Insufficient documentation

## 2012-11-09 DIAGNOSIS — Z951 Presence of aortocoronary bypass graft: Secondary | ICD-10-CM | POA: Insufficient documentation

## 2012-11-09 DIAGNOSIS — Z5189 Encounter for other specified aftercare: Secondary | ICD-10-CM | POA: Insufficient documentation

## 2012-11-09 DIAGNOSIS — I252 Old myocardial infarction: Secondary | ICD-10-CM | POA: Insufficient documentation

## 2012-11-09 NOTE — Progress Notes (Signed)
Cardiac Rehab Medication Review by a Pharmacist  Does the patient  feel that his/her medications are working for him/her?  yes  Has the patient been experiencing any side effects to the medications prescribed?  yes  Does the patient measure his/her own blood pressure or blood glucose at home?  yes   Does the patient have any problems obtaining medications due to transportation or finances?   no  Understanding of regimen: good Understanding of indications: good Potential of compliance: good    Pharmacist comments: Pleasant 67 yo WF presenting with thorough PMH and medication list. She did express concerns about dizziness with Humalog use for which I recommended rechecking BG and offered standard treatment for hypoglycemia. She also mentioned nausea after taking medications in the morning for which I suggested taking with food and possibly spacing out doses. Otherwise, patient seems well aware of medical history and importance of regimen. I am not concerned about compliance.     Von Nils Flack 11/09/2012 9:19 AM

## 2012-11-10 ENCOUNTER — Telehealth (HOSPITAL_COMMUNITY): Payer: Self-pay | Admitting: *Deleted

## 2012-11-10 ENCOUNTER — Telehealth: Payer: Self-pay | Admitting: Neurology

## 2012-11-10 NOTE — Telephone Encounter (Signed)
Maria from Cardiac rehab called to see if patient is cleared from a neurological standpoint to start cardiac rehab, she had a stroke, not sure when.  She has been cleared from cardiac standpoint. She was to start rehab on Monday but will wait to here from [place].  Please advise.

## 2012-11-13 ENCOUNTER — Encounter (HOSPITAL_COMMUNITY): Payer: 59

## 2012-11-13 NOTE — Telephone Encounter (Signed)
She was stable neurologically when last seen in June.

## 2012-11-14 NOTE — Telephone Encounter (Signed)
Note faxed to cardiac rehab and confirmation received.

## 2012-11-14 NOTE — Telephone Encounter (Signed)
Spoke to cardiac rehab and they would like something in writing and faxed to them at [phone].

## 2012-11-14 NOTE — Telephone Encounter (Signed)
Written on RX to be faxed

## 2012-11-15 ENCOUNTER — Encounter (HOSPITAL_COMMUNITY)
Admission: RE | Admit: 2012-11-15 | Discharge: 2012-11-15 | Disposition: A | Discharge status: Home / Self Care | Payer: 59 | Source: Ambulatory Visit | Attending: Cardiology | Admitting: Cardiology

## 2012-11-15 ENCOUNTER — Encounter (HOSPITAL_COMMUNITY): Payer: 59

## 2012-11-15 LAB — GLUCOSE, CAPILLARY
Glucose-Capillary: 230 mg/dL — ABNORMAL HIGH (ref 70–99)
Glucose-Capillary: 184 mg/dL — ABNORMAL HIGH (ref 70–99)

## 2012-11-17 ENCOUNTER — Encounter (HOSPITAL_COMMUNITY): Payer: 59

## 2012-11-17 ENCOUNTER — Encounter (HOSPITAL_COMMUNITY)
Admission: RE | Admit: 2012-11-17 | Discharge: 2012-11-17 | Disposition: A | Discharge status: Home / Self Care | Payer: 59 | Source: Ambulatory Visit | Attending: Cardiology | Admitting: Cardiology

## 2012-11-17 LAB — GLUCOSE, CAPILLARY: Glucose-Capillary: 162 mg/dL — ABNORMAL HIGH (ref 70–99)

## 2012-11-17 NOTE — Progress Notes (Signed)
Reviewed home exercise with pt today.  Pt plans to walk at home and to go to the gym near by home for exercise.  She plans to use the equipment and pool at the gym.  Reviewed THR, pulse, RPE, sign and symptoms, NTG use, and when to call 911 or MD.  Pt voiced understanding. Alberteen Sam, MA, ACSM RCEP

## 2012-11-20 ENCOUNTER — Encounter (HOSPITAL_COMMUNITY)
Admission: RE | Admit: 2012-11-20 | Discharge: 2012-11-20 | Disposition: A | Discharge status: Home / Self Care | Payer: 59 | Source: Ambulatory Visit | Attending: Cardiology | Admitting: Cardiology

## 2012-11-20 ENCOUNTER — Encounter (HOSPITAL_COMMUNITY): Payer: 59

## 2012-11-20 LAB — GLUCOSE, CAPILLARY
Glucose-Capillary: 222 mg/dL — ABNORMAL HIGH (ref 70–99)
Glucose-Capillary: 159 mg/dL — ABNORMAL HIGH (ref 70–99)

## 2012-11-20 NOTE — Progress Notes (Signed)
Weight is 126.7 kg today.  Caryol says she has been short of breath since before her catherization in September.  Upon assessment Yolander's lung fields are clear upon auscultation. Trace lower extremity edema noted.  Olisa weight has been trending up since she began exercise last week. Kenia also reported having chest pain during exercise.  Accacia is not having any chest pain currently. I told Melaney to let us know when she has chest pain at the moment it happens. Patient states understanding. Kallysta says she has chest pain almost on a daily basis especially when she exerts herself.  I called Dr Claris Gladden office to notify of patients complaints spoke with Theodosia Quay RN. Lauren said she will discuss with Dr [name] whether [place] can be adjusted.  Telemetry rhythm Sinus Rhythm with a bundle branch block, T wave inversion which is not new. Will fax exercise flow sheets to Dr. Claris Gladden office for review. I also reviewed Myan's quality of life questionnaire with the patient. Shandelle has low scores in the overall, health and functioning and psychosocial aspects of the questionnaire. Amandajo says she has had some depression and is taking Prozac. Dilan denies any suicidal ideations. I will send quality of life questionnaire for Dr [name] to review also. [name] is going to consider coming twice a week to exercise due to her current state of deconditioning.  Will continue to monitor the patient throughout  the program. Calie's oxygen saturations were 95-99% today at cardiac rehab.

## 2012-11-20 NOTE — Progress Notes (Signed)
[name] [name] says she is following a low sodium diet.

## 2012-11-22 ENCOUNTER — Encounter (HOSPITAL_COMMUNITY): Payer: 59

## 2012-11-22 ENCOUNTER — Encounter (HOSPITAL_COMMUNITY)
Admission: RE | Admit: 2012-11-22 | Discharge: 2012-11-22 | Disposition: A | Discharge status: Home / Self Care | Payer: 59 | Source: Ambulatory Visit | Attending: Cardiology | Admitting: Cardiology

## 2012-11-22 LAB — GLUCOSE, CAPILLARY: Glucose-Capillary: 201 mg/dL — ABNORMAL HIGH (ref 70–99)

## 2012-11-22 NOTE — Progress Notes (Signed)
[name] [name] exercised without complaints of chest pain today.  Sinus brady 49 noted at cool down this morning at cardiac rehab nonsustained. Will fax exercise flow sheets to Dr. Claris Gladden office for review. Vital signs stable.

## 2012-11-24 ENCOUNTER — Encounter (HOSPITAL_COMMUNITY)
Admission: RE | Admit: 2012-11-24 | Discharge: 2012-11-24 | Disposition: A | Discharge status: Home / Self Care | Payer: 59 | Source: Ambulatory Visit | Attending: Cardiology | Admitting: Cardiology

## 2012-11-24 ENCOUNTER — Encounter (HOSPITAL_COMMUNITY): Payer: 59

## 2012-11-24 LAB — GLUCOSE, CAPILLARY: Glucose-Capillary: 178 mg/dL — ABNORMAL HIGH (ref 70–99)

## 2012-11-27 ENCOUNTER — Encounter: Payer: Self-pay | Admitting: Cardiology

## 2012-11-27 ENCOUNTER — Encounter (HOSPITAL_COMMUNITY): Payer: 59

## 2012-11-27 ENCOUNTER — Ambulatory Visit (INDEPENDENT_AMBULATORY_CARE_PROVIDER_SITE_OTHER): Payer: 59 | Admitting: Cardiology

## 2012-11-27 ENCOUNTER — Encounter (HOSPITAL_COMMUNITY)
Admission: RE | Admit: 2012-11-27 | Discharge: 2012-11-27 | Disposition: A | Discharge status: Home / Self Care | Payer: 59 | Source: Ambulatory Visit | Attending: Cardiology | Admitting: Cardiology

## 2012-11-27 VITALS — BP 144/72 | HR 68 | Ht 62.0 in | Wt 275.0 lb

## 2012-11-27 DIAGNOSIS — I509 Heart failure, unspecified: Secondary | ICD-10-CM

## 2012-11-27 DIAGNOSIS — E785 Hyperlipidemia, unspecified: Secondary | ICD-10-CM

## 2012-11-27 DIAGNOSIS — I10 Essential (primary) hypertension: Secondary | ICD-10-CM

## 2012-11-27 DIAGNOSIS — I2510 Atherosclerotic heart disease of native coronary artery without angina pectoris: Secondary | ICD-10-CM

## 2012-11-27 DIAGNOSIS — I5032 Chronic diastolic (congestive) heart failure: Secondary | ICD-10-CM

## 2012-11-27 DIAGNOSIS — R079 Chest pain, unspecified: Secondary | ICD-10-CM

## 2012-11-27 MED ORDER — RANOLAZINE ER 1000 MG PO TB12: 1000.0000 mg | ORAL_TABLET | Freq: Two times a day (BID) | ORAL | Status: DC

## 2012-11-27 NOTE — Progress Notes (Signed)
Patient ID: [name] [name], female   DOB: [birth year], 67 y.o.   [name]: [id number] PCP: Dr. Sandi Mariscal  67 yo with history of CAD s/p CABG, diastolic CHF, and cerebrovascular disease with history of CVA presents for cardiology followup. She had CABG x 5 in 12/11.  Prior to the CABG she had multiple PCIs.  She had a CVA in 2/14 that presented as visual loss.  She ended up getting a cerebral angiogram in 6/14 with 70% ostial right vertebral stenosis, > 70% stenosis proximal left posterior cerebral artery, and 123456 LICA stenosis.  She has been getting periodic episodes of vertigo, question is whether this is related to her cerebrovascular disease. She is seeing Dr. Estanislado Pandy to decide whether intervention is needed.   For the last several months, patient has noted extreme fatigue.  She has diaphoresis just walking around a room at home.  She has been short of breath with exertion significantly since at least the beginning of this year.  She is dyspneic after 50-100 yards or walking up 1/2 flight of steps. She additionally has been getting chest pain with exertion for several months.  This manifests itself as heaviness walking up a hill (such as driveway), up a flight of steps, or carrying the laundary basket.  It occurs 3-4 times a week and resolves with rest.  She has not been exercising.  Echo was done, showing normal LV systolic function.  Adenosine Cardiolite showed a small reversible apical defect and a medium, partially reversible inferior defect with EF 53%.    I took her for left heart catheterization.  This showed patent SVG-D, LIMA-LAD, and sequential SVG-OM branches but SVG-RCA and the native RCA were both totally occluded.  I suspect that her increased symptoms coincided with occlusion of SVG-RCA.  I started her on Imdur to see if this would help with the chest pain and dyspnea.  However, she feels like Imdur causes leg cramps and does not think that she can take it.  I next started her on ranolazine 500  mg bid.  She says that this has helped the exertional chest pressure though it has not resolved it.  Blood pressure has been under good control.  She is now doing cardiac rehab.  Finally, she occasionally has "dizziness" like a spinning sensation when she turns her head to one side.  I think that this is probably positional vertigo.   ECG: NSR, LBBB with QTc 478 msec  Labs (6/14): K 3.8, creatinine 0.71 Labs (8/14): BNP 249, LDL 166 Labs (9/14): K 4.7, creatinine 0.7 Labs (9/14): K 4.6, creatinine 0.9, BNP 109  PMH: 1. Diabetic gastroparesis 2. Type II diabetes 3. HTN 4. Morbid obesity 5. CAD: s/p CABG in 12/11 after prior PCIs.  LIMA-LAD, SVG-D, seq SVG-OM1 and OM2, SVG-PDA. Adenosine Cardiolite (8/14) with EF 53% and a small reversible apical defect with a medium, partially reversible inferior defect.  LHC (9/14) with patent SVG-D, LIMA-LAD (50% distal LAD), and sequential SVG-OM branches; the SVG-RCA and the native RCA were occluded (medical management).  6. Atypical migraines 7. OSA: Intolerant of CPAP.  8. GERD with hiatal hernia 9. OA 10. Diastolic CHF: Echo (A999333) with EF 50-55%, grade II diastolic dysfunction, mild-moderate MR.  Echo (8/14) with EF 55-60%, grade II diastolic dysfunction, mildly increased aortic valve gradient (mean 12 mmHg) but valve opens well, mild MR and mild RV dilation.  11. CKD 12. Chronic LBBB 13. Anxiety 14. Carotid stenosis: Followed by [facility], A999333 LICA stenosis.  15. Cerebrovascular disease:  CVA 2/14 with right posterior cerebral artery territory ischemic infarction. Cerebral angiogram in 6/14 showed 70% right vertebral ostial stenosis, 123456 LICA stenosis, > [id number] proximal left posterior cerebral artery stenosis, posterior communicating artery aneurysm.  16. Positional vertigo (suspected)  SH: Married, nonsmoker  FH: CAD  ROS: All systems reviewed and negative except as per HPI.   Current Outpatient Prescriptions  Medication Sig Dispense Refill   . albuterol (PROVENTIL) (2.5 MG/3ML) 0.083% nebulizer solution Take 2.5 mg by nebulization every 6 (six) hours as needed. For shortness of breath.      Marland Kitchen albuterol (PROVENTIL,VENTOLIN) 90 MCG/ACT inhaler Inhale 2 puffs into the lungs every 6 (six) hours as needed. For shortness of breath.      . ALPRAZolam (XANAX) 0.5 MG tablet Take 0.5 mg by mouth See admin instructions. For anxiety - Patient takes 1 in the morning and 1 in the evening daily. If needed she may take one in the afternoon.      Marland Kitchen amLODipine (NORVASC) 10 MG tablet Take 1 tablet (10 mg total) by mouth daily.  30 tablet  6  . aspirin EC 81 MG tablet Take 81 mg by mouth daily.      . B Complex-C (B-COMPLEX WITH VITAMIN C) tablet Take 1 tablet by mouth daily.      . Calcium 1500 MG tablet Take 1,500 mg by mouth daily.       . Cholecalciferol (VITAMIN D) 2000 UNITS tablet Take 2,000 Units by mouth daily.      . cloNIDine (CATAPRES) 0.2 MG tablet Take 0.2 mg by mouth 2 (two) times daily.      . clopidogrel (PLAVIX) 75 MG tablet Take 1 tablet (75 mg total) by mouth daily.  30 tablet  6  . FLUoxetine (PROZAC) 20 MG capsule Take 20 mg by mouth daily.      . Fluticasone-Salmeterol (ADVAIR DISKUS) 250-50 MCG/DOSE AEPB Inhale 1 puff into the lungs every morning. For asthma symptoms      . furosemide (LASIX) 40 MG tablet 40 mg. Take 1 tablet in the AM and 1/2 tablet (total 20mg ) in the PM      . gabapentin (NEURONTIN) 600 MG tablet Take 600 mg by mouth at bedtime.       Marland Kitchen HUMALOG KWIKPEN 100 UNIT/ML SOPN 14 Units 2 (two) times daily with a meal.       . hydrALAZINE (APRESOLINE) 50 MG tablet Take 50 mg by mouth 3 (three) times daily as needed (for high blood pressure). Does not take dose if BP low      . HYDROcodone-acetaminophen (NORCO/VICODIN) 5-325 MG per tablet Take 1 tablet by mouth every evening.       . insulin glargine (LANTUS) 100 UNIT/ML injection Inject 44 Units into the skin every morning. 44 units in the morning      . losartan  (COZAAR) 100 MG tablet Take 100 mg by mouth 2 (two) times daily.       . metoprolol (LOPRESSOR) 100 MG tablet Take 50 mg by mouth 2 (two) times daily.       . Multiple Vitamin (MULTIVITAMIN) tablet Take 1 tablet by mouth daily.        . niacin (NIASPAN) 1000 MG CR tablet Take 1,000 mg by mouth at bedtime.      . nitroGLYCERIN (NITROSTAT) 0.3 MG SL tablet Place 0.3 mg under the tongue every 5 (five) minutes x 3 doses as needed for chest pain. Last taken on 11/02/12 (took 2 doses and chest  pain resolved)      . omeprazole (PRILOSEC) 20 MG capsule Take 20 mg by mouth daily with breakfast.       . ondansetron (ZOFRAN) 4 MG tablet Take 4 mg by mouth 2 (two) times daily as needed. For nausea       . ONE TOUCH ULTRA TEST test strip       . OVER THE COUNTER MEDICATION Take 250 mg by mouth daily. Magnesium tablets      . rosuvastatin (CRESTOR) 40 MG tablet Take 40 mg by mouth daily.       Marland Kitchen topiramate (TOPAMAX) 50 MG tablet Take 50 mg by mouth 2 (two) times daily.      Marland Kitchen triamterene-hydrochlorothiazide (DYAZIDE) 37.5-25 MG per capsule Take 1 capsule by mouth every morning.       . vitamin B-12 (CYANOCOBALAMIN) 1000 MCG tablet Take 1,000 mcg by mouth daily.      Marland Kitchen zolpidem (AMBIEN) 10 MG tablet Take 10 mg by mouth at bedtime as needed for sleep.       . ranolazine (RANEXA) 1000 MG SR tablet Take 1 tablet (1,000 mg total) by mouth 2 (two) times daily.  60 tablet  6   No current facility-administered medications for this visit.    BP 144/72  Pulse 68  Ht 5\' 2"  (1.575 m)  Wt 124.739 kg (275 lb)  BMI 50.29 kg/m2 General: NAD, obese Neck: No JVD, no thyromegaly or thyroid nodule.  Lungs: Occasional squeaks noted on lung exam.  CV: Nondisplaced PMI.  Heart regular S1/S2 with wide S2 split, no S3/S4, 1/6 early SEM.  1+ edema at ankles bilaterally.  No carotid bruit.  Normal pedal pulses.  Abdomen: Soft, nontender, no hepatosplenomegaly, no distention.  Skin: Intact without lesions or rashes.   Neurologic: Alert and oriented x 3.  Psych: Normal affect. Extremities: No clubbing or cyanosis. Right groin cath site benign.   Assessment/Plan: 1. CAD: Occluded native RCA and SVG-RCA.  The occlusion of SVG-RCA is the likely cause of her angina.  She has not tolerated Imdur as she thinks that it has caused cramping.  Ranolazine has helped angina but has not resolved it.  - Increase ranolazine to 1000 mg bid.  Will get ECG in 2 wks to follow QT interval.  - Continue ASA 81, statin, Plavix 75, losartan, metoprolol,  Crestor.  - Continue cardiac rehab.  2. Chronic diastolic CHF: She does not appear particularly volume overloaded on exam.  It is possible that her exertional dyspnea is an anginal equivalent.  Continue lasix 40 qam, 20 qpm.  3. Hyperlipidemia: LDL was very high for Crestor 40 mg daily when most recently checked.  She is adamant that she has been taking the medication correctly.  LDL was not this high in the past. Lipids were repeated recently by PCP.  I will need to obtain a copy.  4. Hypertension: BP is improved.  Continue current regimen.  5. Cerebrovascular disease: She has history of CVA.  She is on Plavix.  She will followup with neurology/Dr. Estanislado Pandy.    Loralie Champagne 11/27/2012

## 2012-11-27 NOTE — Patient Instructions (Addendum)
Increase Ranexa (ranolazine) to 1000mg  every 12 hours (2 times a day).   Your physician recommends that you schedule a follow-up appointment for an EKG in 2 weeks because he is increasing the dose of Ranexa (ranolazine).   Your physician recommends that you schedule a follow-up appointment in 1 month with Dr [name]. This is scheduled  for Monday December 1,2014 at 2:45PM.

## 2012-11-29 ENCOUNTER — Encounter (HOSPITAL_COMMUNITY)
Admission: RE | Admit: 2012-11-29 | Discharge: 2012-11-29 | Disposition: A | Discharge status: Home / Self Care | Payer: 59 | Source: Ambulatory Visit | Attending: Cardiology | Admitting: Cardiology

## 2012-11-29 ENCOUNTER — Encounter (HOSPITAL_COMMUNITY): Payer: 59

## 2012-11-29 NOTE — Progress Notes (Signed)
[name] told me Dr [name] increased her Ranexa. We continue to encourage [name] [name] to work at lower workloads and to continue to take rest breaks as needed.

## 2012-12-01 ENCOUNTER — Encounter (HOSPITAL_COMMUNITY)
Admission: RE | Admit: 2012-12-01 | Discharge: 2012-12-01 | Disposition: A | Discharge status: Home / Self Care | Payer: 59 | Source: Ambulatory Visit | Attending: Cardiology | Admitting: Cardiology

## 2012-12-01 ENCOUNTER — Encounter (HOSPITAL_COMMUNITY): Payer: 59

## 2012-12-01 LAB — GLUCOSE, CAPILLARY: Glucose-Capillary: 165 mg/dL — ABNORMAL HIGH (ref 70–99)

## 2012-12-04 ENCOUNTER — Encounter (HOSPITAL_COMMUNITY)
Admission: RE | Admit: 2012-12-04 | Discharge: 2012-12-04 | Disposition: A | Discharge status: Home / Self Care | Payer: Medicare Other | Source: Ambulatory Visit | Attending: Cardiology | Admitting: Cardiology

## 2012-12-04 ENCOUNTER — Encounter (HOSPITAL_COMMUNITY): Payer: Medicare Other

## 2012-12-04 DIAGNOSIS — I2510 Atherosclerotic heart disease of native coronary artery without angina pectoris: Secondary | ICD-10-CM | POA: Insufficient documentation

## 2012-12-04 DIAGNOSIS — Z5189 Encounter for other specified aftercare: Secondary | ICD-10-CM | POA: Insufficient documentation

## 2012-12-04 DIAGNOSIS — I252 Old myocardial infarction: Secondary | ICD-10-CM | POA: Insufficient documentation

## 2012-12-04 DIAGNOSIS — Z951 Presence of aortocoronary bypass graft: Secondary | ICD-10-CM | POA: Insufficient documentation

## 2012-12-04 LAB — GLUCOSE, CAPILLARY: Glucose-Capillary: 179 mg/dL — ABNORMAL HIGH (ref 70–99)

## 2012-12-06 ENCOUNTER — Encounter (HOSPITAL_COMMUNITY): Payer: Medicare Other

## 2012-12-07 ENCOUNTER — Other Ambulatory Visit: Payer: Self-pay

## 2012-12-08 ENCOUNTER — Encounter (HOSPITAL_COMMUNITY): Payer: Medicare Other

## 2012-12-08 ENCOUNTER — Encounter: Payer: Self-pay | Admitting: Cardiology

## 2012-12-11 ENCOUNTER — Encounter: Payer: Self-pay | Admitting: Cardiology

## 2012-12-11 ENCOUNTER — Encounter (HOSPITAL_COMMUNITY): Payer: Medicare Other

## 2012-12-11 ENCOUNTER — Encounter (HOSPITAL_COMMUNITY)
Admission: RE | Admit: 2012-12-11 | Discharge: 2012-12-11 | Disposition: A | Discharge status: Home / Self Care | Payer: Medicare Other | Source: Ambulatory Visit | Attending: Cardiology | Admitting: Cardiology

## 2012-12-11 NOTE — Progress Notes (Signed)
[name] [name] 67 y.o. female Nutrition Consult Spoke with pt. Pt c/o gaining 25 lbs since starting Humalog 09/2012. Pt reports frequent episodes of "hypoglycemia." Pt states she used to feel s/s of hypoglycemia when her CBG is less than 100 mg/dL. Now, pt states she does not feel s/s of hypoglycemia until CBG's "are in the 60's." Per discussion, pt eating "about 30 grams of total carbs/meal." Pt reports getting hypoglycemic whenever she takes 14 units of Humalog. Pt has tried 8 units of Humalog and CBG's have still "gotten too low." Pt's last A1c reportedly 7.1, which indicates blood glucose fairly well controlled. Pt concerned re: hypoglycemic episodes because "it affects my vision and I can't tell if it's another stroke or my sugar." Pt states fasting CBG's before meals "usually around 120-150 mg/dL." Pt agreed to try 5 units of Humalog before meals to determine if less Humalog prevents frequent episodes of hypoglycemia.   Nutrition Diagnosis   Food-and nutrition-related knowledge deficit related to lack of exposure to information as related to diagnosis of: ? CVD ? DM    Obesity related to excessive energy intake as evidenced by a BMI of 49.4  Nutrition RX/ Estimated Daily Nutrition Needs for: wt loss  1500-2000 Kcal, 40-55 gm fat, 9-15 gm sat fat, 1.4-2.0 gm trans-fat, <1500 mg sodium, 175-250 gm CHO   Nutrition Intervention   Pt's individual nutrition plan reviewed with pt.   Benefits of adopting Therapeutic Lifestyle Changes discussed when Medficts reviewed.   Pt to attend the Portion Distortion class   Pt to attend the  ? Nutrition I class                     ? Nutrition II class        ? Diabetes Blitz class       ? Diabetes Q & A class   Pt given handouts for: ? 1500 kcal, 5-day menu ideas   Continue client-centered nutrition education by RD, as part of interdisciplinary care. Goal(s)   Pt to identify food quantities necessary to achieve: ? wt loss to a goal wt of 252-270 lb  (114.6-122.8 kg) at graduation from cardiac rehab.    CBG concentrations in the normal range or as close to normal as is safely possible.   Use pre-meal and post-meal CBG's and A1c to determine whether adjustments in food/meal planning will be beneficial or if any meds need to be combined with nutrition therapy. Monitor and Evaluate progress toward nutrition goal with team. Nutrition Risk: Change to Moderate  Derek Mound, M.Ed, RD, LDN, CDE 12/11/2012 1:33 PM

## 2012-12-11 NOTE — Progress Notes (Signed)
[name] [name] was noted to have a nonsustained run of bradycardia at 49 this morning. Patient asymptomatic. Will fax exercise flow sheets to Dr. [name] office for review with ECG tracing. Vital signs stable. Will continue to monitor the patient throughout  the program.

## 2012-12-12 ENCOUNTER — Ambulatory Visit (INDEPENDENT_AMBULATORY_CARE_PROVIDER_SITE_OTHER): Payer: Medicare Other | Admitting: Cardiology

## 2012-12-12 VITALS — BP 172/82 | HR 50 | Resp 18

## 2012-12-12 DIAGNOSIS — R079 Chest pain, unspecified: Secondary | ICD-10-CM

## 2012-12-12 NOTE — Progress Notes (Signed)
Patient saw Dr.McLean on 10/27 at which time he increased her Ranexa to 1000mg  BID. She states that it has helped to decrease her level of chest pain. She still has occasional dizziness. EKG performed. Will review with Dr.McLean when he returns to the office tomorrow. Patient aware to stay on the same dose of Ranexa. She will get a call back tomorrow only if he wants to make any med changes. Patient aware of her return visit with Dr.McLean in December.

## 2012-12-13 ENCOUNTER — Encounter (HOSPITAL_COMMUNITY)
Admission: RE | Admit: 2012-12-13 | Discharge: 2012-12-13 | Disposition: A | Discharge status: Home / Self Care | Payer: Medicare Other | Source: Ambulatory Visit | Attending: Cardiology | Admitting: Cardiology

## 2012-12-13 ENCOUNTER — Encounter (HOSPITAL_COMMUNITY): Payer: Medicare Other

## 2012-12-14 ENCOUNTER — Other Ambulatory Visit (HOSPITAL_COMMUNITY): Payer: Self-pay | Admitting: Interventional Radiology

## 2012-12-14 ENCOUNTER — Telehealth (HOSPITAL_COMMUNITY): Payer: Self-pay | Admitting: Interventional Radiology

## 2012-12-14 ENCOUNTER — Telehealth: Payer: Self-pay | Admitting: *Deleted

## 2012-12-14 DIAGNOSIS — I729 Aneurysm of unspecified site: Secondary | ICD-10-CM

## 2012-12-14 NOTE — Telephone Encounter (Signed)
Called pt left VM for her to call me back to schedule consult JM

## 2012-12-14 NOTE — Telephone Encounter (Signed)
BP reported 172/82 manuel cuff and 174/72 automatic cuff when pt was here for EKG 12/12/12. Dr [name] recommended increase hydralazine to 75mg  tid. LMTCB for pt.

## 2012-12-15 ENCOUNTER — Encounter (HOSPITAL_COMMUNITY): Payer: Medicare Other

## 2012-12-15 ENCOUNTER — Telehealth (HOSPITAL_COMMUNITY): Payer: Self-pay | Admitting: Family Medicine

## 2012-12-18 ENCOUNTER — Encounter (HOSPITAL_COMMUNITY): Payer: Medicare Other

## 2012-12-18 ENCOUNTER — Encounter (HOSPITAL_COMMUNITY)
Admission: RE | Admit: 2012-12-18 | Discharge: 2012-12-18 | Disposition: A | Discharge status: Home / Self Care | Payer: Medicare Other | Source: Ambulatory Visit | Attending: Cardiology | Admitting: Cardiology

## 2012-12-18 NOTE — Telephone Encounter (Signed)
Pt states she feels this is just a random high BP reading because of recent stress with her mother's  illness. Her  BP usually is in the 120s/70-80s range. It is followed closely in rehab. She will not increase hydralazine at this time. Pt will notify Dr [name] if BP changes.

## 2012-12-20 ENCOUNTER — Encounter (HOSPITAL_COMMUNITY): Payer: Medicare Other

## 2012-12-20 ENCOUNTER — Encounter (HOSPITAL_COMMUNITY)
Admission: RE | Admit: 2012-12-20 | Discharge: 2012-12-20 | Disposition: A | Discharge status: Home / Self Care | Payer: Medicare Other | Source: Ambulatory Visit | Attending: Cardiology | Admitting: Cardiology

## 2012-12-20 NOTE — Progress Notes (Signed)
Sinus brady 48 post exercise at cardiac rehab nonsustained. Exit blood pressure 136/78. Will notify Dr [name]. Vital signs stable. Will continue to monitor the patient throughout  the program.

## 2012-12-22 ENCOUNTER — Encounter (HOSPITAL_COMMUNITY)
Admission: RE | Admit: 2012-12-22 | Discharge: 2012-12-22 | Disposition: A | Discharge status: Home / Self Care | Payer: Medicare Other | Source: Ambulatory Visit | Attending: Cardiology | Admitting: Cardiology

## 2012-12-22 ENCOUNTER — Encounter (HOSPITAL_COMMUNITY): Payer: Medicare Other

## 2012-12-22 NOTE — Progress Notes (Signed)
[name] 67 y.o. female Nutrition Note Spoke with pt.  Nutrition Plan and Nutrition Survey goals reviewed with pt. Pt is following Step 2 of the Therapeutic Lifestyle Changes diet. Pt is diabetic. Last A1c per pt was 7.1, which indicates blood glucose not optimally controlled. Pt reports she started taking 5 units of Humalog before breakfast and 10 units before dinner, "which has helped my blood sugar not go too low." Pt taking Lantus in the morning. Pt reports fasting CBG's have mostly been 150-160 mg/dL. Pt states fasting CBG's have been 200 and 201 mg/dL "twice this week." Pt is under a significant amount of stress due to her mom falling/breaking her hip. Pt states she has an appointment with Dr. Buddy Duty "either early December or January." This writer encouraged pt to discuss insulin regimen. Diabetes Education test results reviewed. Pt expressed understanding of the information discussed. Pt aware of nutrition education classes offered, pt has previously attended the nutrition classes and plans on attending nutrition classes again.  Nutrition Diagnosis Food-and nutrition-related knowledge deficit related to lack of exposure to information as related to diagnosis of: ? CVD  Obesity related to excessive energy intake as evidenced by a BMI of 49.4  Nutrition RX/ Estimated Daily Nutrition Needs for: wt loss  1500-2000 Kcal, 40-55 gm fat, 9-15 gm sat fat, 1.4-2.0 gm trans-fat, <1500 mg sodium, 175-250 gm CHO   Nutrition Intervention   Pt's individual nutrition plan reviewed with pt.   Pt to try taking Lantus at night.   Will notify Dr. Buddy Duty re: pt adjustments to insulin regimen.   Benefits of adopting Therapeutic Lifestyle Changes discussed when Medficts reviewed.   Pt to attend the Portion Distortion class   Pt to attend the  ? Nutrition I class                      ? Nutrition II class         ? Diabetes Blitz class        ? Diabetes Q & A class   Continue client-centered nutrition education  by RD, as part of interdisciplinary care. Goal(s)   Pt to identify food quantities necessary to achieve: ? wt loss to a goal wt of 252-270 lb (114.6-122.8 kg) at graduation from cardiac rehab.    CBG concentrations in the normal range or as close to normal as is safely possible.   Use pre-meal and post-meal CBG's and A1c to determine whether adjustments in food/meal planning will be beneficial or if any meds need to be combined with nutrition therapy. Monitor and Evaluate progress toward nutrition goal with team. Nutrition Risk: Moderate Derek Mound, M.Ed, RD, LDN, CDE 12/22/2012 10:25 AM

## 2012-12-25 ENCOUNTER — Encounter (HOSPITAL_COMMUNITY): Payer: Medicare Other

## 2012-12-25 ENCOUNTER — Encounter (HOSPITAL_COMMUNITY)
Admission: RE | Admit: 2012-12-25 | Discharge: 2012-12-25 | Disposition: A | Discharge status: Home / Self Care | Payer: Medicare Other | Source: Ambulatory Visit | Attending: Cardiology | Admitting: Cardiology

## 2012-12-27 ENCOUNTER — Ambulatory Visit (HOSPITAL_COMMUNITY): Admission: RE | Admit: 2012-12-27 | Payer: Medicare Other | Source: Ambulatory Visit

## 2012-12-27 ENCOUNTER — Telehealth (HOSPITAL_COMMUNITY): Payer: Self-pay | Admitting: Family Medicine

## 2012-12-27 ENCOUNTER — Encounter (HOSPITAL_COMMUNITY): Payer: Medicare Other

## 2012-12-29 ENCOUNTER — Encounter (HOSPITAL_COMMUNITY): Payer: Medicare Other

## 2013-01-01 ENCOUNTER — Encounter (HOSPITAL_COMMUNITY): Payer: Medicare Other

## 2013-01-01 ENCOUNTER — Encounter (HOSPITAL_COMMUNITY): Admission: RE | Admit: 2013-01-01 | Payer: Medicare Other | Source: Ambulatory Visit

## 2013-01-01 ENCOUNTER — Telehealth (HOSPITAL_COMMUNITY): Payer: Self-pay | Admitting: *Deleted

## 2013-01-01 ENCOUNTER — Ambulatory Visit: Payer: 59 | Admitting: Cardiology

## 2013-01-03 ENCOUNTER — Ambulatory Visit
Admission: RE | Admit: 2013-01-03 | Discharge: 2013-01-03 | Disposition: A | Discharge status: Home / Self Care | Payer: Medicare Other | Source: Ambulatory Visit | Attending: Family Medicine | Admitting: Family Medicine

## 2013-01-03 ENCOUNTER — Other Ambulatory Visit: Payer: Self-pay | Admitting: Family Medicine

## 2013-01-03 ENCOUNTER — Encounter (HOSPITAL_COMMUNITY): Payer: Medicare Other

## 2013-01-03 ENCOUNTER — Inpatient Hospital Stay (HOSPITAL_COMMUNITY): Admission: RE | Admit: 2013-01-03 | Payer: Medicare Other | Source: Ambulatory Visit

## 2013-01-03 DIAGNOSIS — J45901 Unspecified asthma with (acute) exacerbation: Secondary | ICD-10-CM

## 2013-01-04 ENCOUNTER — Telehealth (HOSPITAL_COMMUNITY): Payer: Self-pay | Admitting: *Deleted

## 2013-01-05 ENCOUNTER — Encounter (HOSPITAL_COMMUNITY): Payer: Medicare Other

## 2013-01-08 ENCOUNTER — Encounter (HOSPITAL_COMMUNITY): Payer: Medicare Other

## 2013-01-09 ENCOUNTER — Encounter: Payer: Self-pay | Admitting: Vascular Surgery

## 2013-01-09 ENCOUNTER — Ambulatory Visit (INDEPENDENT_AMBULATORY_CARE_PROVIDER_SITE_OTHER): Payer: Medicare Other | Admitting: Nurse Practitioner

## 2013-01-09 ENCOUNTER — Encounter: Payer: Self-pay | Admitting: Nurse Practitioner

## 2013-01-09 VITALS — BP 134/74 | HR 60 | Ht 64.0 in | Wt 273.0 lb

## 2013-01-09 DIAGNOSIS — I5032 Chronic diastolic (congestive) heart failure: Secondary | ICD-10-CM

## 2013-01-09 DIAGNOSIS — E785 Hyperlipidemia, unspecified: Secondary | ICD-10-CM

## 2013-01-09 DIAGNOSIS — G4733 Obstructive sleep apnea (adult) (pediatric): Secondary | ICD-10-CM

## 2013-01-09 DIAGNOSIS — I6350 Cerebral infarction due to unspecified occlusion or stenosis of unspecified cerebral artery: Secondary | ICD-10-CM

## 2013-01-09 DIAGNOSIS — I10 Essential (primary) hypertension: Secondary | ICD-10-CM

## 2013-01-09 DIAGNOSIS — I6529 Occlusion and stenosis of unspecified carotid artery: Secondary | ICD-10-CM

## 2013-01-09 DIAGNOSIS — I2510 Atherosclerotic heart disease of native coronary artery without angina pectoris: Secondary | ICD-10-CM

## 2013-01-09 DIAGNOSIS — Z87898 Personal history of other specified conditions: Secondary | ICD-10-CM

## 2013-01-09 DIAGNOSIS — I509 Heart failure, unspecified: Secondary | ICD-10-CM

## 2013-01-09 DIAGNOSIS — I679 Cerebrovascular disease, unspecified: Secondary | ICD-10-CM

## 2013-01-09 MED ORDER — CLOPIDOGREL BISULFATE 75 MG PO TABS: 75.0000 mg | ORAL_TABLET | Freq: Every day | ORAL | Status: DC

## 2013-01-09 MED ORDER — TOPIRAMATE 50 MG PO TABS: 50.0000 mg | ORAL_TABLET | Freq: Two times a day (BID) | ORAL | Status: DC

## 2013-01-09 NOTE — Progress Notes (Signed)
I have read the note, and I agree with the clinical assessment and plan.  Kalab Camps KEITH   

## 2013-01-09 NOTE — Patient Instructions (Signed)
Continue  Plavix and aspirin for secondary stroke prevention  refill Plavix Continue Topamax for headaches Will refill The blood pressure less than [id number] systolic Carotid Dopplers followed by Dr. Doren Custard Healthy diet and slow weight reduction along with cardiac rehabilitation 3 times a week Followup in 6 months

## 2013-01-09 NOTE — Progress Notes (Signed)
[facility]  PATIENT: [name] [name] DOB: [birth year]   REASON FOR VISIT: for history of CVA 03/2012   HISTORY OF PRESENT ILLNESS: [name] [name], 67 year old returns for followup. She has a history of stroke in February 2014 associated with generalized blurring of vision in the right and left visual fields. She had no speech changes. MRI of the brain confirmed a right posterior cerebral artery distribution stroke, she is currently on aspirin and Plavix and denies further strokelike symptoms however she says the end of October she saw Dr.  Loralie Champagne for extreme fatique, chest pain on exertion. Cardiac catheterization revealed 100% occlusion RCA. She is currently in cardiac rehabilitation 3 times a week. She is having a flare of her asthma at present and claims she had steroid injections last week that  elevated her sugars  to the 200 range. She has just completed her antibiotics. Cerebral angiogram June 2014, reveals 70% right vertebral stenosis, 70% left post cerebral stenosis and 123456 LICA stenosis and post comm artery aneurysm. Dr. Estanislado Pandy is following this.   HISTORY: She has a history of obesity, migraine headache, diabetes, and hypertension. The patient has a lifelong history of classic migraine associated with posterior headache and visual disturbances. The patient has had episodes of mutism and difficulty with understanding and generating language with several of her headaches. The patient has been seen through this office previously for evaluation of these headaches. The patient has continued to get her headaches on occasion. The patient developed a headache in February that was associated with generalized blurring of vision in the right and left visual fields. The patient did not have speech changes with this. The patient has not had any improvement in the vision, and she went to see her ophthalmologist. The patient was eventually referred for MRI evaluation of the brain,  and this confirmed a right posterior cerebral artery distribution stroke. Around the time of onset of symptoms, the patient was running blood sugars in the 400 range. The patient had a MRA that showed some distal stenosis of the right posterior cerebral artery, and some stenosis that was previously documented in the left P1 segment of the posterior cerebral artery. The patient continues to note generalized blurring of vision, and difficulty with focusing on things but she thinks it is some better. She denies any numbness or weakness of the arms, legs, or face. The patient denies any significant change in balance. The patient has not had any problems with speech or swallowing. The patient does have a headache in retro-orbital areas and back of the head. The patient has recently undergone a 2-D echocardiogram in November 2013 with an ejection fraction of 50-55%. The patient has been followed by Dr. Doren Custard with the carotid Doppler studies the last study was done in November 2013. The patient has 60-79% stenosis of the left internal carotid artery. No significant stenosis was seen on the right. She has another appt this week for repeat dopplers. The patient is on aspirin and Plavix. Her blood pressure is noted to be high today and in retaking it dropped to 0000000 systolic. Apparently it has been difficult to control in the past.   REVIEW OF SYSTEMS: Full 14 system review of systems performed and notable only for those listed, all others are neg:  Constitutional: Fatigue  Cardiovascular: N/A  Ear/Nose/Throat: N/A  Skin: N/A  Eyes: N/A  Respiratory: Cough, wheezing, shortness of breath  Gastroitestinal: Urgency  Hematology/Lymphatic: Easy bruising  Endocrine: N/A Musculoskeletal: Joint pain ,aching  muscles  Allergy/Immunology: N/A  Neurological: Weakness dizziness, memory loss  Psychiatric: Anxiety nervousness  ALLERGIES: Allergies  Allergen Reactions  . Amoxicillin Shortness Of Breath and Rash  .  Erythromycin Other (See Comments)    Trouble swallowing  . Penicillins Shortness Of Breath and Rash  . Metformin Nausea Only and Other (See Comments)     dizzy  . Metformin And Related     dizziness and nausea  . Tape Other (See Comments)    Plastic tape causes skin to rip if left on for long periods of time  . Isosorbide Mononitrate [Isosorbide]     Joint aches, muscles hurt, difficult to walk    HOME MEDICATIONS: Outpatient Prescriptions Prior to Visit  Medication Sig Dispense Refill  . albuterol (PROVENTIL) (2.5 MG/3ML) 0.083% nebulizer solution Take 2.5 mg by nebulization every 6 (six) hours as needed. For shortness of breath.      Marland Kitchen albuterol (PROVENTIL,VENTOLIN) 90 MCG/ACT inhaler Inhale 2 puffs into the lungs every 6 (six) hours as needed. For shortness of breath.      . ALPRAZolam (XANAX) 0.5 MG tablet Take 0.5 mg by mouth See admin instructions. For anxiety - Patient takes 1 in the morning and 1 in the evening daily. If needed she may take one in the afternoon.      Marland Kitchen amLODipine (NORVASC) 10 MG tablet Take 1 tablet (10 mg total) by mouth daily.  30 tablet  6  . aspirin EC 81 MG tablet Take 81 mg by mouth daily.      . B Complex-C (B-COMPLEX WITH VITAMIN C) tablet Take 1 tablet by mouth daily.      . Calcium 1500 MG tablet Take 1,500 mg by mouth daily.       . Cholecalciferol (VITAMIN D) 2000 UNITS tablet Take 2,000 Units by mouth daily.      . cloNIDine (CATAPRES) 0.2 MG tablet Take 0.2 mg by mouth 2 (two) times daily.      . clopidogrel (PLAVIX) 75 MG tablet Take 1 tablet (75 mg total) by mouth daily.  30 tablet  6  . FLUoxetine (PROZAC) 20 MG capsule Take 20 mg by mouth daily.      . Fluticasone-Salmeterol (ADVAIR DISKUS) 250-50 MCG/DOSE AEPB Inhale 1 puff into the lungs every morning. For asthma symptoms      . furosemide (LASIX) 40 MG tablet 40 mg. Take 1 tablet in the AM and 1/2 tablet (total 20mg ) in the PM      . gabapentin (NEURONTIN) 600 MG tablet Take 600 mg by mouth  at bedtime.       Marland Kitchen HUMALOG KWIKPEN 100 UNIT/ML SOPN 14 Units 2 (two) times daily with a meal.       . hydrALAZINE (APRESOLINE) 50 MG tablet Take 50 mg by mouth 3 (three) times daily as needed (for high blood pressure). Does not take dose if BP low      . HYDROcodone-acetaminophen (NORCO/VICODIN) 5-325 MG per tablet Take 1 tablet by mouth every evening.       . insulin glargine (LANTUS) 100 UNIT/ML injection Inject 44 Units into the skin every morning. 44 units in the morning      . losartan (COZAAR) 100 MG tablet Take 100 mg by mouth 2 (two) times daily.       . metoprolol (LOPRESSOR) 100 MG tablet Take 50 mg by mouth 2 (two) times daily.       . Multiple Vitamin (MULTIVITAMIN) tablet Take 1 tablet by mouth daily.        Marland Kitchen  niacin (NIASPAN) 1000 MG CR tablet Take 1,000 mg by mouth at bedtime.      . nitroGLYCERIN (NITROSTAT) 0.3 MG SL tablet Place 0.3 mg under the tongue every 5 (five) minutes x 3 doses as needed for chest pain. Last taken on 11/02/12 (took 2 doses and chest pain resolved)      . omeprazole (PRILOSEC) 20 MG capsule Take 20 mg by mouth daily with breakfast.       . ondansetron (ZOFRAN) 4 MG tablet Take 4 mg by mouth 2 (two) times daily as needed. For nausea       . ONE TOUCH ULTRA TEST test strip       . OVER THE COUNTER MEDICATION Take 250 mg by mouth daily. Magnesium tablets      . ranolazine (RANEXA) 1000 MG SR tablet Take 1 tablet (1,000 mg total) by mouth 2 (two) times daily.  60 tablet  6  . rosuvastatin (CRESTOR) 40 MG tablet Take 40 mg by mouth daily.       Marland Kitchen topiramate (TOPAMAX) 50 MG tablet Take 50 mg by mouth 2 (two) times daily.      Marland Kitchen triamterene-hydrochlorothiazide (DYAZIDE) 37.5-25 MG per capsule Take 1 capsule by mouth every morning.       . vitamin B-12 (CYANOCOBALAMIN) 1000 MCG tablet Take 1,000 mcg by mouth daily.      Marland Kitchen zolpidem (AMBIEN) 10 MG tablet Take 10 mg by mouth at bedtime as needed for sleep.        No facility-administered medications prior to visit.     PAST MEDICAL HISTORY: Past Medical History  Diagnosis Date  . Migraine   . Osteoarthritis   . Vomiting     persistent  . Asthma   . Ventral hernia     hx of  . Diabetes mellitus   . GERD (gastroesophageal reflux disease)   . Obesity 01-2010  . Hyperlipidemia   . Hypertension   . Irregular heart beat   . Myocardial infarction 2011    04/1999, 02/2000, 01/2005  . Coronary artery disease   . Heart murmur   . Shortness of breath   . Obstructive sleep apnea     no cpap- sleep study 5 yrs ago  . Anxiety   . PONV (postoperative nausea and vomiting)   . Peripheral vascular disease     ? numbness, tingling arms and legs  . Anemia     hx  . H/O hiatal hernia   . Neuromuscular disorder     ?  Marland Kitchen Dysrhythmia   . Mental disorder     hx of confusion  . Chronic kidney disease     frequency  . Stroke 03/22/12    right side brain    PAST SURGICAL HISTORY: Past Surgical History  Procedure Laterality Date  . Tumor removal  1970's    ovarian  . Leg surgery  1970's    right, rods and pins  . Hernia repair    . Angioplasty      multiple times  . Cholecystectomy    . Gastric stapling  1984  . Total abdominal hysterectomy  1970's  . Ankle fracture surgery  70's    lft   . Cardiac catheterization  01,02,05,06,07,08,11    stents 02  . Fracture surgery    . Appendectomy    . Coronary artery bypass graft  12/11  . Coronary angioplasty      stents  . Esophagogastroduodenoscopy  08/03/2011    Procedure: ESOPHAGOGASTRODUODENOSCOPY (  EGD);  Surgeon: Shann Medal, MD;  Location: [facility];  Service: General;  Laterality: N/A;  . Gall stone removal  05/2003  . Root canal  10/2000  . Left heart cath  10/10/12    Dr [name].    FAMILY HISTORY: Family History  Problem Relation Age of Onset  . Heart disease Father     Heart Disease before age 42  . Diabetes Father   . Hyperlipidemia Father   . Hypertension Father   . Heart attack Father   . Diabetes      family hx of  paternal grandmother, aunt and grandfather- uncles  . Hypertension Mother   . Cancer Sister     SOCIAL HISTORY: History   Social History  . Marital Status: Married    Spouse Name: [name]     Number of Children: 1  . Years of Education: 12+   Occupational History  . retired    Social History Main Topics  . Smoking status: Never Smoker   . Smokeless tobacco: Never Used  . Alcohol Use: No  . Drug Use: No  . Sexual Activity: Not on file     Comment: hysterectomy   Other Topics Concern  . Not on file   Social History Narrative   Patient lives at home with husband [name].    Patient has 1 child.    Patient is not currently working.    Patient has 2 years of college.      PHYSICAL EXAM  Filed Vitals:   01/09/13 1018  BP: 161/74  Pulse: 60  Height: 5\' 4"  (1.626 m)  Weight: 273 lb (123.832 kg)   Body mass index is 46.84 kg/(m^2).  Generalized: Well developed, morbidly obese female in no acute distress  Head: normocephalic and atraumatic,. Oropharynx benign  Neck: Supple, no carotid bruits  Cardiac: Regular rate rhythm, no murmur  Lungs  wheezes noted bilaterally Neurological examination   Mentation: Alert oriented to time, place, history taking. Follows all commands speech and language fluent  Cranial nerve II-XII: Fundoscopic exam reveals flat disc margins.Pupils were equal round reactive to light extraocular movements were full, visual field were full on confrontational test. Facial sensation and strength were normal. hearing was intact to finger rubbing bilaterally. Uvula tongue midline. head turning and shoulder shrug were normal and symmetric.Tongue protrusion into cheek strength was normal. Motor: normal bulk and tone, full strength in the BUE, BLE, fine finger movements normal, no pronator drift. No focal weakness Sensory: normal and symmetric to light touch, pinprick, and  vibration  Coordination: finger-nose-finger, heel-to-shin bilaterally, no  dysmetria Reflexes: Brachioradialis 2/2, biceps 2/2, triceps 2/2, patellar 2/2, Achilles 2/2, plantar responses were flexor bilaterally. Gait and Station: Rising up from seated position without assistance, normal stance,  moderate stride, good arm swing, smooth turning, able to perform tiptoe, and heel walking without difficulty. Tandem gait is unsteady.   DIAGNOSTIC DATA (LABS, IMAGING, TESTING) - I reviewed patient records, labs, notes, testing and imaging myself where available.  Lab Results  Component Value Date   WBC 9.1 10/04/2012   HGB 16.1* 10/04/2012   HCT 46.6* 10/04/2012   MCV 90.3 10/04/2012   PLT 264.0 10/04/2012      Component Value Date/Time   NA 140 10/24/2012 0950   K 4.6 10/24/2012 0950   CL 107 10/24/2012 0950   CO2 26 10/24/2012 0950   GLUCOSE 145* 10/24/2012 0950   BUN 28* 10/24/2012 0950   CREATININE 0.9 10/24/2012 0950   CALCIUM 9.1  10/24/2012 0950   PROT 6.9 04/16/2011 0954   ALBUMIN 3.6 04/16/2011 0954   AST 11 04/16/2011 0954   ALT 16 04/16/2011 0954   ALKPHOS 70 04/16/2011 0954   BILITOT 0.7 04/16/2011 0954   GFRNONAA 89* 07/25/2012 0805   GFRAA >90 07/25/2012 0805   Lab Results  Component Value Date   CHOL 224* 09/21/2012   HDL 39.60 09/21/2012   LDLCALC  Value: 173        Total Cholesterol/HDL:CHD Risk Coronary Heart Disease Risk Table                     Men   Women  1/2 Average Risk   3.4   3.3* 01/17/2008   LDLDIRECT 166.6 09/21/2012   TRIG 218.0* 09/21/2012   CHOLHDL 6 09/21/2012    ASSESSMENT AND PLAN  67 y.o. year old female  has a past medical history of Migraine; Osteoarthritis; Asthma;  Diabetes mellitus; GERD (gastroesophageal reflux disease); Obesity (01-2010); Hyperlipidemia; Hypertension; Myocardial infarction (2011); Coronary artery disease;  Shortness of breath; Obstructive sleep apnea; Anxiety;  Peripheral vascular disease; Anemia; H/O hiatal hernia; Neuromuscular disorder; Dysrhythmia;  Chronic kidney disease; and Stroke (03/22/12). here in followup. She is  currently on Plavix and aspirin without  further stroke like symptoms. He says that Topamax is controlling her headaches.  Continue  Plavix and aspirin for secondary stroke prevention  refill Plavix Continue Topamax for headaches Will refill Keep  blood pressure less than [id number] systolic Carotid Dopplers followed by Dr. Doren Custard, aneurysm by Dr. Estanislado Pandy.  Healthy diet and slow weight reduction along with cardiac rehabilitation 3 times a week Followup in 6 months Dennie Bible, [facility], [facility], APRN  [facility] [address], [place], [place] [address] [phone]

## 2013-01-10 ENCOUNTER — Encounter: Payer: Self-pay | Admitting: Vascular Surgery

## 2013-01-10 ENCOUNTER — Other Ambulatory Visit (HOSPITAL_COMMUNITY): Payer: 59

## 2013-01-10 ENCOUNTER — Encounter (HOSPITAL_COMMUNITY): Payer: Medicare Other

## 2013-01-10 ENCOUNTER — Ambulatory Visit (INDEPENDENT_AMBULATORY_CARE_PROVIDER_SITE_OTHER): Payer: Medicare Other | Admitting: Vascular Surgery

## 2013-01-10 ENCOUNTER — Ambulatory Visit (HOSPITAL_COMMUNITY)
Admission: RE | Admit: 2013-01-10 | Discharge: 2013-01-10 | Disposition: A | Discharge status: Home / Self Care | Payer: Medicare Other | Source: Ambulatory Visit | Attending: Vascular Surgery | Admitting: Vascular Surgery

## 2013-01-10 DIAGNOSIS — I658 Occlusion and stenosis of other precerebral arteries: Secondary | ICD-10-CM | POA: Insufficient documentation

## 2013-01-10 DIAGNOSIS — I6529 Occlusion and stenosis of unspecified carotid artery: Secondary | ICD-10-CM

## 2013-01-10 DIAGNOSIS — I6350 Cerebral infarction due to unspecified occlusion or stenosis of unspecified cerebral artery: Secondary | ICD-10-CM

## 2013-01-10 NOTE — Assessment & Plan Note (Signed)
The left carotid stenosis has progressed to greater than 80% by duplex. This reason I think consideration should be given to a elective left carotid endarterectomy for carotid stenting. The stenosis may potentially be symptomatic given her intermittent episodes of expressive aphasia. However she does have a significant cardiac history and has been having chest pain. This reason we will get her an appointment to see her cardiologist Dr. Loralie Champagne. If she is felt to be too high risk for surgery she could be considered for carotid stenting. She has previously been studied by Dr. Estanislado Pandy who studied her vertebral arteries. If she is a candidate for carotid endarterectomy we would need to stop her Plavix one-week preoperatively if at all possible. We'll make further recommendations pending her cardiac evaluation. Of note she is currently on Plavix, aspirin, and Crestor.

## 2013-01-10 NOTE — Progress Notes (Signed)
[facility] [place]  Patient name: [name] [name] [name]: [id number] DOB: 08-Jun-1945 Sex: female  REASON FOR VISIT: follow up of carotid disease.   HPI: [name] [name] is a 66 y.o. female with a complicated history. I last saw her on 07/12/2012. At that time she had a 60-79% left carotid stenosis. There was no stenosis on the right. I had been considering a left carotid endarterectomy in the past after she had an episode that sounded like expressive aphasia. However she underwent a cerebral arteriogram in February of 2013 which showed that the stenosis was only approximately 50%. After discussing the case with her neurologist he felt that she was likely having migraine headaches and that the left carotid stenosis was not symptomatic. She has been on aspirin and Plavix.  Prior to her last visit it was noted that she had a right posterior cerebral artery ischemic stroke associated with vision loss in both eyes. An MRI during that workup suggested significant vertebral artery occlusive disease. I recommended arch aortogram and cerebral arteriography to focus on the vertebral basilar system as the potential cause of her stroke and dizziness at that time. Given that she might be a candidate for vertebral artery angioplasty and stenting we were going to arrange for Dr. Estanislado Pandy to perform this.   The patient underwent a cerebral arteriogram on 07/25/2012. She had a 70% stenosis of the right vertebral artery at its origin although there was unimpeded flow more distally to the cranial skull base and intracranially. She was also noted to have a 55-60% stenosis of the left internal carotid artery and a 3-5 mm saccular aneurysm of the left PCOM. At that time, Dr. Estanislado Pandy recommended continuing her antiplatelet therapy with aggressive conservative management of her risk factors. If the patient continued to have symptoms then consideration could be given to an endovascular approach to her  right vertebral artery stenosis. A 3 month follow up visit was arranged.   Since I saw her last, she continues to have intermittent episodes of expressive aphasia. This sometimes occurs more than once in one day, and at other times she can go several weeks without having any symptoms. He also has had some persistent dizziness which is not new. She denies focal weakness or paresthesias.  Of note, she does describe exertional chest pain. She also describes dyspnea on exertion and orthopnea. These symptoms have been relatively stable.  Past Medical History  Diagnosis Date  . Migraine   . Osteoarthritis   . Vomiting     persistent  . Asthma   . Ventral hernia     hx of  . Diabetes mellitus   . GERD (gastroesophageal reflux disease)   . Obesity 01-2010  . Hyperlipidemia   . Hypertension   . Irregular heart beat   . Myocardial infarction 2011    04/1999, 02/2000, 01/2005  . Coronary artery disease   . Heart murmur   . Shortness of breath   . Obstructive sleep apnea     no cpap- sleep study 5 yrs ago  . Anxiety   . PONV (postoperative nausea and vomiting)   . Peripheral vascular disease     ? numbness, tingling arms and legs  . Anemia     hx  . H/O hiatal hernia   . Neuromuscular disorder     ?  Marland Kitchen Dysrhythmia   . Mental disorder     hx of confusion  . Chronic kidney disease  frequency  . Stroke 03/22/12    right side brain  . CHF (congestive heart failure)    Family History  Problem Relation Age of Onset  . Heart disease Father     Heart Disease before age 55  . Diabetes Father   . Hyperlipidemia Father   . Hypertension Father   . Heart attack Father   . Deep vein thrombosis Father   . Diabetes      family hx of paternal grandmother, aunt and grandfather- uncles  . Hypertension Mother   . Cancer Sister    SOCIAL HISTORY: History  Substance Use Topics  . Smoking status: Never Smoker   . Smokeless tobacco: Never Used  . Alcohol Use: No   Allergies  Allergen  Reactions  . Amoxicillin Shortness Of Breath and Rash  . Erythromycin Other (See Comments)    Trouble swallowing  . Penicillins Shortness Of Breath and Rash  . Metformin Nausea Only and Other (See Comments)     dizzy  . Metformin And Related     dizziness and nausea  . Tape Other (See Comments)    Plastic tape causes skin to rip if left on for long periods of time  . Isosorbide Mononitrate [Isosorbide]     Joint aches, muscles hurt, difficult to walk   Current Outpatient Prescriptions  Medication Sig Dispense Refill  . albuterol (PROVENTIL) (2.5 MG/3ML) 0.083% nebulizer solution Take 2.5 mg by nebulization every 6 (six) hours as needed. For shortness of breath.      Marland Kitchen albuterol (PROVENTIL,VENTOLIN) 90 MCG/ACT inhaler Inhale 2 puffs into the lungs every 6 (six) hours as needed. For shortness of breath.      . ALPRAZolam (XANAX) 0.5 MG tablet Take 0.5 mg by mouth See admin instructions. For anxiety - Patient takes 1 in the morning and 1 in the evening daily. If needed she may take one in the afternoon.      Marland Kitchen amLODipine (NORVASC) 10 MG tablet Take 1 tablet (10 mg total) by mouth daily.  30 tablet  6  . aspirin EC 81 MG tablet Take 81 mg by mouth daily.      . B Complex-C (B-COMPLEX WITH VITAMIN C) tablet Take 1 tablet by mouth daily.      . Calcium 1500 MG tablet Take 1,500 mg by mouth daily.       . Cholecalciferol (VITAMIN D) 2000 UNITS tablet Take 2,000 Units by mouth daily.      . cloNIDine (CATAPRES) 0.2 MG tablet Take 0.2 mg by mouth 2 (two) times daily.      . clopidogrel (PLAVIX) 75 MG tablet Take 1 tablet (75 mg total) by mouth daily.  30 tablet  6  . FLUoxetine (PROZAC) 20 MG capsule Take 20 mg by mouth daily.      . Fluticasone-Salmeterol (ADVAIR DISKUS) 250-50 MCG/DOSE AEPB Inhale 1 puff into the lungs every morning. For asthma symptoms      . furosemide (LASIX) 40 MG tablet 40 mg. Take 1 tablet in the AM and 1/2 tablet (total 20mg ) in the PM      . gabapentin (NEURONTIN) 600  MG tablet Take 600 mg by mouth at bedtime.       Marland Kitchen HUMALOG KWIKPEN 100 UNIT/ML SOPN 14 Units 2 (two) times daily with a meal.       . hydrALAZINE (APRESOLINE) 50 MG tablet Take 50 mg by mouth 3 (three) times daily as needed (for high blood pressure). Does not take dose if BP  low      . HYDROcodone-acetaminophen (NORCO/VICODIN) 5-325 MG per tablet Take 1 tablet by mouth every evening.       . insulin glargine (LANTUS) 100 UNIT/ML injection Inject 44 Units into the skin every morning. 44 units in the morning      . losartan (COZAAR) 100 MG tablet Take 100 mg by mouth 2 (two) times daily.       . Magnesium Oxide 250 MG TABS Take 1 tablet by mouth 2 (two) times daily.      . metoprolol (LOPRESSOR) 100 MG tablet Take 50 mg by mouth 2 (two) times daily.       . Multiple Vitamin (MULTIVITAMIN) tablet Take 1 tablet by mouth daily.        . niacin (NIASPAN) 1000 MG CR tablet Take 1,000 mg by mouth at bedtime.      . nitroGLYCERIN (NITROSTAT) 0.3 MG SL tablet Place 0.3 mg under the tongue every 5 (five) minutes x 3 doses as needed for chest pain. Last taken on 11/02/12 (took 2 doses and chest pain resolved)      . omeprazole (PRILOSEC) 20 MG capsule Take 20 mg by mouth daily with breakfast.       . ondansetron (ZOFRAN) 4 MG tablet Take 4 mg by mouth 2 (two) times daily as needed. For nausea       . ONE TOUCH ULTRA TEST test strip       . OVER THE COUNTER MEDICATION Take 250 mg by mouth daily. Magnesium tablets      . predniSONE (DELTASONE) 20 MG tablet as directed.       . ranolazine (RANEXA) 1000 MG SR tablet Take 1 tablet (1,000 mg total) by mouth 2 (two) times daily.  60 tablet  6  . rosuvastatin (CRESTOR) 40 MG tablet Take 40 mg by mouth daily.       Marland Kitchen topiramate (TOPAMAX) 50 MG tablet Take 1 tablet (50 mg total) by mouth 2 (two) times daily.  60 tablet  6  . triamterene-hydrochlorothiazide (DYAZIDE) 37.5-25 MG per capsule Take 1 capsule by mouth every morning.       . vitamin B-12 (CYANOCOBALAMIN)  1000 MCG tablet Take 1,000 mcg by mouth daily.      Marland Kitchen zolpidem (AMBIEN) 10 MG tablet Take 10 mg by mouth at bedtime as needed for sleep.        No current facility-administered medications for this visit.   REVIEW OF SYSTEMS: Valu.Nieves ] denotes positive finding; [  ] denotes negative finding  CARDIOVASCULAR:  Valu.Nieves ] chest pain   Valu.Nieves ] chest pressure   [ ]  palpitations   Valu.Nieves ] orthopnea   Valu.Nieves ] dyspnea on exertion   [ ]  claudication   [ ]  rest pain   [ ]  DVT   [ ]  phlebitis PULMONARY:   Valu.Nieves ] productive cough   Valu.Nieves ] asthma   Valu.Nieves ] wheezing NEUROLOGIC:   [ ]  weakness  [ ]  paresthesias  [ ]  aphasia  [ ]  amaurosis  Valu.Nieves ] dizziness HEMATOLOGIC:   [ ]  bleeding problems   [ ]  clotting disorders MUSCULOSKELETAL:  [ ]  joint pain   [ ]  joint swelling [ ]  leg swelling GASTROINTESTINAL: [ ]   blood in stool  [ ]   hematemesis GENITOURINARY:  [ ]   dysuria  [ ]   hematuria PSYCHIATRIC:  [ ]  history of major depression INTEGUMENTARY:  [ ]  rashes  [ ]  ulcers CONSTITUTIONAL:  Valu.Nieves ] fever 2 weeks  ago, none since.  [ ]  chills  PHYSICAL EXAM: Filed Vitals:   01/10/13 1406 01/10/13 1411  BP: 147/59 142/60  Pulse: 51   Height: 5\' 4"  (1.626 m)   Weight: 270 lb 9.6 oz (122.743 kg)   SpO2: 95%    Body mass index is 46.43 kg/(m^2). GENERAL: The patient is a well-nourished female, in no acute distress. The vital signs are documented above. CARDIOVASCULAR: There is a regular rate and rhythm. I do not detect carotid bruits. PULMONARY: There is good air exchange bilaterally without wheezing or rales. ABDOMEN: Soft and non-tender with normal pitched bowel sounds.  MUSCULOSKELETAL: There are no major deformities or cyanosis. NEUROLOGIC: No focal weakness or paresthesias are detected. SKIN: There are no ulcers or rashes noted. PSYCHIATRIC: The patient has a normal affect.  DATA:  I have independently interpreted her carotid duplex scan today. This shows a less than 40% right internal carotid artery stenosis. The stenosis on the  left has progressed to greater than 80%. End-diastolic velocity on the left is 135 cm/s. Both vertebral arteries have antegrade flow.  MEDICAL ISSUES:  Cerebral artery occlusion with cerebral infarction The left carotid stenosis has progressed to greater than 80% by duplex. This reason I think consideration should be given to a elective left carotid endarterectomy for carotid stenting. The stenosis may potentially be symptomatic given her intermittent episodes of expressive aphasia. However she does have a significant cardiac history and has been having chest pain. This reason we will get her an appointment to see her cardiologist Dr. Loralie Champagne. If she is felt to be too high risk for surgery she could be considered for carotid stenting. She has previously been studied by Dr. Estanislado Pandy who studied her vertebral arteries. If she is a candidate for carotid endarterectomy we would need to stop her Plavix one-week preoperatively if at all possible. We'll make further recommendations pending her cardiac evaluation. Of note she is currently on Plavix, aspirin, and Crestor.   [facility] [place] Beeper: [phone]

## 2013-01-11 ENCOUNTER — Encounter: Payer: Self-pay | Admitting: *Deleted

## 2013-01-11 ENCOUNTER — Ambulatory Visit: Payer: Medicare Other | Admitting: Physician Assistant

## 2013-01-12 ENCOUNTER — Encounter (HOSPITAL_COMMUNITY): Payer: Medicare Other

## 2013-01-15 ENCOUNTER — Encounter (HOSPITAL_COMMUNITY): Payer: Medicare Other

## 2013-01-17 ENCOUNTER — Encounter (HOSPITAL_COMMUNITY): Payer: Medicare Other

## 2013-01-18 ENCOUNTER — Ambulatory Visit (HOSPITAL_COMMUNITY)
Admission: RE | Admit: 2013-01-18 | Discharge: 2013-01-18 | Disposition: A | Discharge status: Home / Self Care | Payer: Medicare Other | Source: Ambulatory Visit | Attending: Interventional Radiology | Admitting: Interventional Radiology

## 2013-01-18 DIAGNOSIS — I729 Aneurysm of unspecified site: Secondary | ICD-10-CM

## 2013-01-19 ENCOUNTER — Encounter (HOSPITAL_COMMUNITY): Payer: Medicare Other

## 2013-01-22 ENCOUNTER — Encounter (HOSPITAL_COMMUNITY): Payer: Medicare Other

## 2013-01-22 ENCOUNTER — Telehealth (HOSPITAL_COMMUNITY): Payer: Self-pay | Admitting: *Deleted

## 2013-01-22 NOTE — Telephone Encounter (Signed)
Pt called early and left message regarding blockage in left carotid.  Message left that cardiac rehab will need clearance to return to exercise and parameters for vital signs and any activity restrictions.  Once we have received the specific information, we will contact pt to resume exercise.

## 2013-01-24 ENCOUNTER — Encounter (HOSPITAL_COMMUNITY): Payer: Medicare Other

## 2013-01-24 ENCOUNTER — Telehealth (HOSPITAL_COMMUNITY): Payer: Self-pay | Admitting: *Deleted

## 2013-01-24 NOTE — Telephone Encounter (Signed)
Message copied by Rowe Pavy on Wed Jan 24, 2013 10:40 AM ------      Message from: Angelia Mould      Created: Tue Jan 23, 2013  9:06 AM      Regarding: RE: Madaline Brilliant to return to exercise at Cardiac Rehab       OK to participate in cardiac rehab. No restrictions.       Thanks      Gae Gallop, MD      ----- Message -----         From: Donney Dice, RN         Sent: 01/22/2013   4:53 PM           To: Harrell Gave, RN, #      Subject: Ok to return to exercise at Cardiac Rehab                Dr. Scot Dock,            Pt is a participant in Cardiac rehab with the diagnosis of Stable Angina. Pt is presently on medical hold due to Asthma Excerebration.  Pt given the ok to return to rehab on 12/29.   Pt notified rehab staff this morning that she has 80% blockage to her Left Carotid Artery.  Is pt appropriate to continue to exercise in cardiac rehab?  If so parameters for BP and any activity restrictions?  Pt scheduled to see Dr. [name] on 02/07/2013.            Thank you for your input      Maurice Small RN       ------

## 2013-01-26 ENCOUNTER — Encounter (HOSPITAL_COMMUNITY): Payer: Medicare Other

## 2013-01-29 ENCOUNTER — Encounter (HOSPITAL_COMMUNITY)
Admission: RE | Admit: 2013-01-29 | Discharge: 2013-01-29 | Disposition: A | Discharge status: Home / Self Care | Payer: Medicare Other | Source: Ambulatory Visit | Attending: Cardiology | Admitting: Cardiology

## 2013-01-29 ENCOUNTER — Telehealth: Payer: Self-pay | Admitting: Cardiology

## 2013-01-29 ENCOUNTER — Encounter (HOSPITAL_COMMUNITY): Payer: Medicare Other

## 2013-01-29 DIAGNOSIS — Z951 Presence of aortocoronary bypass graft: Secondary | ICD-10-CM | POA: Insufficient documentation

## 2013-01-29 DIAGNOSIS — I2510 Atherosclerotic heart disease of native coronary artery without angina pectoris: Secondary | ICD-10-CM | POA: Diagnosis not present

## 2013-01-29 DIAGNOSIS — Z5189 Encounter for other specified aftercare: Secondary | ICD-10-CM | POA: Insufficient documentation

## 2013-01-29 DIAGNOSIS — I252 Old myocardial infarction: Secondary | ICD-10-CM | POA: Diagnosis not present

## 2013-01-29 LAB — GLUCOSE, CAPILLARY: Glucose-Capillary: 134 mg/dL — ABNORMAL HIGH (ref 70–99)

## 2013-01-29 NOTE — Progress Notes (Signed)
Patient heart rate was noted at 46-47 post exercise Sinus Loletha Grayer. Blood pressure 112/60. Patient reported feeling lightheaded and some blurred vision. This resolved after rest.  CBG 134. Grips equal bilaterally. Dr Claris Gladden office called and notified.  Alertt and oriented times 3. ECG tracings faxed to Dr Claris Gladden office for review. Mala's heart rate was low as 43. Spoke with Lattie Haw at Dr Claris Gladden office alerted of bradycardia.

## 2013-01-29 NOTE — Progress Notes (Signed)
[name] [name] says she has been taking 100 mg of metoprolol twice a day. [name] [name] spoke with Lattie Haw from Dr Claris Gladden office over the phone. Dr [name] decreased [name] [name]'s metoprolol back to 50 mg twice a day. Dr [name] said [name] [name] may return to exercise on Wednesday. [name] [name] remained symptom free upon exit from cardiac rehab. Will continue to monitor the patient throughout  the program.

## 2013-01-29 NOTE — Telephone Encounter (Signed)
New Message  [name] [name]// Cardiac rehab outpatient Pt

## 2013-01-29 NOTE — Telephone Encounter (Signed)
Received call form maria at [facility] cardiac rehab. Pt experienced c/o being lightheaded  and heart rate dropped to 43bpm, pt bp was 112/60.pt has moderate carotid stenosis and is scheduled for carotid surgery.pt had no nerve defecit.Pt currently tal=king Metoprolol 100mg  bid.per Dr.McLean instructions pt is to reduce Metoprolol to 50Mg  bid, ok for pt to go home today with no restrictions.pt and maria verbalized uderstanding

## 2013-01-31 ENCOUNTER — Encounter (HOSPITAL_COMMUNITY)
Admission: RE | Admit: 2013-01-31 | Discharge: 2013-01-31 | Disposition: A | Discharge status: Home / Self Care | Payer: Medicare Other | Source: Ambulatory Visit | Attending: Cardiology | Admitting: Cardiology

## 2013-01-31 ENCOUNTER — Encounter (HOSPITAL_COMMUNITY): Payer: Medicare Other

## 2013-01-31 DIAGNOSIS — Z5189 Encounter for other specified aftercare: Secondary | ICD-10-CM | POA: Diagnosis not present

## 2013-01-31 LAB — GLUCOSE, CAPILLARY: Glucose-Capillary: 161 mg/dL — ABNORMAL HIGH (ref 70–99)

## 2013-02-02 ENCOUNTER — Encounter (HOSPITAL_COMMUNITY)
Admission: RE | Admit: 2013-02-02 | Discharge: 2013-02-02 | Disposition: A | Discharge status: Home / Self Care | Payer: Medicare Other | Source: Ambulatory Visit | Attending: Cardiology | Admitting: Cardiology

## 2013-02-02 ENCOUNTER — Encounter (HOSPITAL_COMMUNITY): Payer: Medicare Other

## 2013-02-02 DIAGNOSIS — Z951 Presence of aortocoronary bypass graft: Secondary | ICD-10-CM | POA: Insufficient documentation

## 2013-02-02 DIAGNOSIS — I2510 Atherosclerotic heart disease of native coronary artery without angina pectoris: Secondary | ICD-10-CM | POA: Insufficient documentation

## 2013-02-02 DIAGNOSIS — I252 Old myocardial infarction: Secondary | ICD-10-CM | POA: Insufficient documentation

## 2013-02-02 DIAGNOSIS — Z5189 Encounter for other specified aftercare: Secondary | ICD-10-CM | POA: Insufficient documentation

## 2013-02-02 LAB — GLUCOSE, CAPILLARY: Glucose-Capillary: 158 mg/dL — ABNORMAL HIGH (ref 70–99)

## 2013-02-05 ENCOUNTER — Encounter (HOSPITAL_COMMUNITY): Payer: Medicare Other

## 2013-02-05 ENCOUNTER — Encounter (HOSPITAL_COMMUNITY)
Admission: RE | Admit: 2013-02-05 | Discharge: 2013-02-05 | Disposition: A | Discharge status: Home / Self Care | Payer: Medicare Other | Source: Ambulatory Visit | Attending: Cardiology | Admitting: Cardiology

## 2013-02-07 ENCOUNTER — Encounter (HOSPITAL_COMMUNITY): Payer: Medicare Other

## 2013-02-07 ENCOUNTER — Encounter (HOSPITAL_COMMUNITY)
Admission: RE | Admit: 2013-02-07 | Discharge: 2013-02-07 | Disposition: A | Discharge status: Home / Self Care | Payer: Medicare Other | Source: Ambulatory Visit | Attending: Cardiology | Admitting: Cardiology

## 2013-02-07 ENCOUNTER — Ambulatory Visit (INDEPENDENT_AMBULATORY_CARE_PROVIDER_SITE_OTHER): Payer: Medicare Other | Admitting: Cardiology

## 2013-02-07 ENCOUNTER — Ambulatory Visit: Payer: Medicare Other | Admitting: Cardiology

## 2013-02-07 ENCOUNTER — Encounter: Payer: Self-pay | Admitting: Cardiology

## 2013-02-07 VITALS — BP 120/70 | HR 60 | Ht 64.0 in | Wt 277.0 lb

## 2013-02-07 DIAGNOSIS — I2510 Atherosclerotic heart disease of native coronary artery without angina pectoris: Secondary | ICD-10-CM

## 2013-02-07 DIAGNOSIS — I679 Cerebrovascular disease, unspecified: Secondary | ICD-10-CM

## 2013-02-07 DIAGNOSIS — I5032 Chronic diastolic (congestive) heart failure: Secondary | ICD-10-CM

## 2013-02-07 DIAGNOSIS — E785 Hyperlipidemia, unspecified: Secondary | ICD-10-CM

## 2013-02-07 DIAGNOSIS — I10 Essential (primary) hypertension: Secondary | ICD-10-CM

## 2013-02-07 DIAGNOSIS — R0602 Shortness of breath: Secondary | ICD-10-CM

## 2013-02-07 DIAGNOSIS — I509 Heart failure, unspecified: Secondary | ICD-10-CM

## 2013-02-07 MED ORDER — FUROSEMIDE 40 MG PO TABS: ORAL_TABLET | ORAL | Status: DC

## 2013-02-07 MED ORDER — METOPROLOL TARTRATE 50 MG PO TABS: ORAL_TABLET | ORAL | Status: DC

## 2013-02-07 NOTE — Progress Notes (Signed)
Patient ID: [name] [name], female   DOB: May 20, 1945, 68 y.o.   [name]: [id number] PCP: Dr. Sandi [name]  68 yo with history of CAD s/p CABG, diastolic CHF, and cerebrovascular disease with history of CVA presents for cardiology followup. She had CABG x 5 in 12/11.  Prior to the CABG she had multiple PCIs.  She had a CVA in 2/14 that presented as visual loss.  She ended up getting a cerebral angiogram in 6/14 with 70% ostial right vertebral stenosis, > 70% stenosis proximal left posterior cerebral artery, and 123456 LICA stenosis.  She has been on ASA and Plavix.  She recently saw Dr. Scot Dock, and carotid dopplers suggested worsening of LICA stenosis to > 123XX123.  She has had episodes of transient expressive aphasia for several months.  Dr. Scot Dock is considering CEA.  Given dyspnea and chest pain that was worsened from her baseline, I took [name] [name] for left heart catheterization in 9/14.  This showed patent SVG-D, LIMA-LAD, and sequential SVG-OM branches but SVG-RCA and the native RCA were both totally occluded.  I suspect that her increased symptoms coincided with occlusion of SVG-RCA.  I started her on Imdur to see if this would help with the chest pain and dyspnea.  However, she feels like Imdur causes leg cramps and does not think that she can take it.  I next started her on ranolazine 500 mg bid and titrated this up to 1000 mg bid.  She says that this has helped the exertional chest pressure though it has not resolved it.  Blood pressure has been under good control recently.  She is now doing cardiac rehab.  There was some concern at rehab for bradycardia, so metoprolol was recently cut back to 50 mg bid.    She continues to have at least mid chest pain on most days.  However, this is improved on the current medical regimen.  She will get chest pain walking up steps or across a parking lot.  It does not occur at rest and will resolve with rest.  She gets short of breath with about the same degree of exertion that  will bring on chest discomfort.  She has been taking all her medications. Weight is up 2 lbs.   ECG: NSR, LBBB with QTc 475 msec  Labs (6/14): K 3.8, creatinine 0.71 Labs (8/14): BNP 249, LDL 166 Labs (9/14): K 4.7, creatinine 0.7 Labs (9/14): K 4.6, creatinine 0.9, BNP 109  PMH: 1. Diabetic gastroparesis 2. Type II diabetes 3. HTN 4. Morbid obesity 5. CAD: s/p CABG in 12/11 after prior PCIs.  LIMA-LAD, SVG-D, seq SVG-OM1 and OM2, SVG-PDA. Adenosine Cardiolite (8/14) with EF 53% and a small reversible apical defect with a medium, partially reversible inferior defect.  LHC (9/14) with patent SVG-D, LIMA-LAD (50% distal LAD), and sequential SVG-OM branches; the SVG-RCA and the native RCA were occluded (medical management).  6. Atypical migraines 7. OSA: Intolerant of CPAP.  8. GERD with hiatal hernia 9. OA 10. Diastolic CHF: Echo (A999333) with EF 50-55%, grade II diastolic dysfunction, mild-moderate MR.  Echo (8/14) with EF 55-60%, grade II diastolic dysfunction, mildly increased aortic valve gradient (mean 12 mmHg) but valve opens well, mild MR and mild RV dilation.  11. CKD 12. Chronic LBBB 13. Anxiety 14. Carotid stenosis: Followed by [facility], [id number] LICA stenosis [id number].  15. Cerebrovascular disease: CVA 2/14 with right posterior cerebral artery territory ischemic infarction. Cerebral angiogram in 6/14 showed 70% right vertebral ostial stenosis, 123456 LICA stenosis, > XX123456  proximal left posterior cerebral artery stenosis, posterior communicating artery aneurysm.  Patient has had episodes of transient expressive aphasia.  Carotid dopplers ([id number]) showed XX123456 LICA stenosis.  16. Positional vertigo (suspected)  SH: Married, nonsmoker  FH: CAD  ROS: All systems reviewed and negative except as per HPI.   Current Outpatient Prescriptions  Medication Sig Dispense Refill  . albuterol (PROVENTIL) (2.5 MG/3ML) 0.083% nebulizer solution Take 2.5 mg by nebulization every 6 (six) hours as needed. For  shortness of breath.      Marland Kitchen albuterol (PROVENTIL,VENTOLIN) 90 MCG/ACT inhaler Inhale 2 puffs into the lungs every 6 (six) hours as needed. For shortness of breath.      . ALPRAZolam (XANAX) 0.5 MG tablet Take 0.5 mg by mouth See admin instructions. For anxiety - Patient takes 1 in the morning and 1 in the evening daily. If needed she may take one in the afternoon.      Marland Kitchen amLODipine (NORVASC) 10 MG tablet Take 1 tablet (10 mg total) by mouth daily.  30 tablet  6  . aspirin EC 81 MG tablet Take 81 mg by mouth daily.      . B Complex-C (B-COMPLEX WITH VITAMIN C) tablet Take 1 tablet by mouth daily.      . Calcium 1500 MG tablet Take 1,500 mg by mouth daily.       . Cholecalciferol (VITAMIN D) 2000 UNITS tablet Take 2,000 Units by mouth daily.      . cloNIDine (CATAPRES) 0.2 MG tablet Take 0.2 mg by mouth 2 (two) times daily.      . clopidogrel (PLAVIX) 75 MG tablet Take 1 tablet (75 mg total) by mouth daily.  30 tablet  6  . FLUoxetine (PROZAC) 20 MG capsule Take 20 mg by mouth daily.      . Fluticasone-Salmeterol (ADVAIR DISKUS) 250-50 MCG/DOSE AEPB Inhale 1 puff into the lungs every morning. For asthma symptoms      . gabapentin (NEURONTIN) 600 MG tablet Take 600 mg by mouth at bedtime.       Marland Kitchen HUMALOG KWIKPEN 100 UNIT/ML SOPN 14 Units 2 (two) times daily with a meal.       . hydrALAZINE (APRESOLINE) 50 MG tablet Take 50 mg by mouth 3 (three) times daily as needed (for high blood pressure). Does not take dose if BP low      . HYDROcodone-acetaminophen (NORCO/VICODIN) 5-325 MG per tablet Take 1 tablet by mouth every evening.       . insulin glargine (LANTUS) 100 UNIT/ML injection Inject 44 Units into the skin every morning. 44 units in the morning      . ipratropium-albuterol (DUONEB) 0.5-2.5 (3) MG/3ML SOLN Take 3 mLs by nebulization every 4 (four) hours as needed.      Marland Kitchen losartan (COZAAR) 100 MG tablet Take 100 mg by mouth 2 (two) times daily.       . Magnesium Oxide 250 MG TABS Take 1 tablet by  mouth 2 (two) times daily.      . Multiple Vitamin (MULTIVITAMIN) tablet Take 1 tablet by mouth daily.        . niacin (NIASPAN) 1000 MG CR tablet Take 1,000 mg by mouth at bedtime.      . nitroGLYCERIN (NITROSTAT) 0.3 MG SL tablet Place 0.3 mg under the tongue every 5 (five) minutes x 3 doses as needed for chest pain. Last taken on 11/02/12 (took 2 doses and chest pain resolved)      . omeprazole (PRILOSEC) 20 MG capsule  Take 20 mg by mouth daily with breakfast.       . ondansetron (ZOFRAN) 4 MG tablet Take 4 mg by mouth 2 (two) times daily as needed. For nausea       . ONE TOUCH ULTRA TEST test strip       . OVER THE COUNTER MEDICATION Take 250 mg by mouth daily. Magnesium tablets      . ranolazine (RANEXA) 1000 MG SR tablet Take 1 tablet (1,000 mg total) by mouth 2 (two) times daily.  60 tablet  6  . rosuvastatin (CRESTOR) 40 MG tablet Take 40 mg by mouth daily.       Marland Kitchen topiramate (TOPAMAX) 50 MG tablet Take 1 tablet (50 mg total) by mouth 2 (two) times daily.  60 tablet  6  . triamterene-hydrochlorothiazide (DYAZIDE) 37.5-25 MG per capsule Take 1 capsule by mouth every morning.       . vitamin B-12 (CYANOCOBALAMIN) 1000 MCG tablet Take 1,000 mcg by mouth daily.      Marland Kitchen zolpidem (AMBIEN) 10 MG tablet Take 10 mg by mouth at bedtime as needed for sleep.       . furosemide (LASIX) 40 MG tablet 1 and 1/2 tablets (total 60mg ) two times a day for 3 days, then decrease to 1 tablet (total 40mg ) two times a day.  90 tablet  3  . metoprolol (LOPRESSOR) 50 MG tablet 1 and 1/2 tablets (total 75mg ) two times a day  90 tablet  3   No current facility-administered medications for this visit.    BP 120/70  Pulse 60  Ht 5\' 4"  (1.626 m)  Wt 125.646 kg (277 lb)  BMI 47.52 kg/m2 General: NAD, obese Neck: JVP 10 cm, no thyromegaly or thyroid nodule.  Lungs: Occasional squeaks noted on lung exam.  CV: Nondisplaced PMI.  Heart regular S1/S2 with wide S2 split, no S3/S4, 1/6 early SEM.  1+ edema 2/3 to knees  bilaterally.  No carotid bruit.  Normal pedal pulses.  Abdomen: Soft, nontender, no hepatosplenomegaly, no distention.  Skin: Intact without lesions or rashes.  Neurologic: Alert and oriented x 3.  Psych: Normal affect. Extremities: No clubbing or cyanosis. Right groin cath site benign.   Assessment/Plan: 1. CAD: Occluded native RCA and SVG-RCA.  The occlusion of SVG-RCA is the likely cause of her angina.  She has not tolerated Imdur as she thinks that it has caused cramping.  Ranolazine has helped angina but has not resolved it.  - Continue current ranolazine dose.  Will get ECG to assess QT interval.  - Continue ASA 81, statin, Plavix 75, losartan,  Crestor.  - Increase metoprolol back to 75 mg bid to try to help with angina (was on 100 mg bid, but this was decreased due to bradycardia).  .  2. Chronic diastolic CHF: Patient is volume overloaded on exam.  This can lower anginal threshold.  NYHA class III symptoms.  I will increase Lasix to 60 mg bid x 3 days then down to 40 mg bid.  BMET/BNP in 10 days.  3. Hyperlipidemia: Lipids done recently at PCP's office.  We will ask for a copy. 4. Hypertension: BP is improved.  Continue current regimen.  5. Cerebrovascular disease: She has history of CVA and is now having episodes of transient expressive aphasia.  She is on Plavix.  She has > 123XX123 LICA stenosis as well as significant vertebral stenosis.  She will need a carotid intervention.  If it is technically feasible with a good chance  for success, I would favor carotid stenting as this patient will be relatively high risk for surgery (frequent angina with coronary disease not amenable to intervention, OSA, active diastolic CHF).   However, if the best outcome is likely with surgery, would choose that path (surgery would be risky but not out of the question).  Will need to discuss with Dr. Scot Dock.  Loralie Champagne 02/07/2013

## 2013-02-07 NOTE — Patient Instructions (Signed)
Increase metoprolol tartrate to 75mg  two times a day. This will be 1 and 1/2 of a 50mg  tablet two times a day.   Increase lasix (furosemide) to 60mg  two times a day for 3 days, then decrease lasix to 40mg  two times a day. This will be 1 and 1/2 of a 40mg  tablet two times a day for 3 days, then 1 of a 40mg  tablet two times a day.   Your physician recommends that you return for lab work in: 11 days--BMET/BNP  Your physician recommends that you schedule a follow-up appointment in: 1 month with Dr [name].

## 2013-02-09 ENCOUNTER — Encounter (HOSPITAL_COMMUNITY)
Admission: RE | Admit: 2013-02-09 | Discharge: 2013-02-09 | Disposition: A | Discharge status: Home / Self Care | Payer: Medicare Other | Source: Ambulatory Visit | Attending: Cardiology | Admitting: Cardiology

## 2013-02-09 ENCOUNTER — Encounter (HOSPITAL_COMMUNITY): Payer: Medicare Other

## 2013-02-09 LAB — GLUCOSE, CAPILLARY: Glucose-Capillary: 128 mg/dL — ABNORMAL HIGH (ref 70–99)

## 2013-02-12 ENCOUNTER — Encounter (HOSPITAL_COMMUNITY)
Admission: RE | Admit: 2013-02-12 | Discharge: 2013-02-12 | Disposition: A | Discharge status: Home / Self Care | Payer: Medicare Other | Source: Ambulatory Visit | Attending: Cardiology | Admitting: Cardiology

## 2013-02-12 ENCOUNTER — Encounter (HOSPITAL_COMMUNITY): Payer: Medicare Other

## 2013-02-14 ENCOUNTER — Encounter (HOSPITAL_COMMUNITY): Payer: Medicare Other

## 2013-02-14 ENCOUNTER — Telehealth (HOSPITAL_COMMUNITY): Payer: Self-pay | Admitting: Family Medicine

## 2013-02-14 ENCOUNTER — Telehealth: Payer: Self-pay | Admitting: Surgery

## 2013-02-14 NOTE — Telephone Encounter (Addendum)
Message copied by Gena Fray on Wed Feb 14, 2013  2:12 PM ------      Message from: Denman George      Created: Wed Feb 14, 2013  1:39 PM      Regarding: appt. 1/19 w/ [facility]       This is Dr. Nicole Cella pt. / dx. Carotid stenosis.  Needs an appt. with Dr. Trula Slade to discuss (L) carotid stenting.  Per [facility], Dr. Trula Slade has agreed to see her Mon., 1/19.  Cell # [phone]. Thanks. ------  02/14/13: spoke with pt, dpm

## 2013-02-16 ENCOUNTER — Encounter (HOSPITAL_COMMUNITY)
Admission: RE | Admit: 2013-02-16 | Discharge: 2013-02-16 | Disposition: A | Discharge status: Home / Self Care | Payer: Medicare Other | Source: Ambulatory Visit | Attending: Cardiology | Admitting: Cardiology

## 2013-02-16 ENCOUNTER — Encounter (HOSPITAL_COMMUNITY): Admission: RE | Admit: 2013-02-16 | Payer: Medicare Other | Source: Ambulatory Visit

## 2013-02-16 ENCOUNTER — Encounter: Payer: Self-pay | Admitting: Surgery

## 2013-02-16 LAB — GLUCOSE, CAPILLARY: Glucose-Capillary: 167 mg/dL — ABNORMAL HIGH (ref 70–99)

## 2013-02-19 ENCOUNTER — Encounter: Payer: Self-pay | Admitting: Surgery

## 2013-02-19 ENCOUNTER — Encounter (HOSPITAL_COMMUNITY)
Admission: RE | Admit: 2013-02-19 | Discharge: 2013-02-19 | Disposition: A | Discharge status: Home / Self Care | Payer: Medicare Other | Source: Ambulatory Visit | Attending: Cardiology | Admitting: Cardiology

## 2013-02-19 ENCOUNTER — Ambulatory Visit (INDEPENDENT_AMBULATORY_CARE_PROVIDER_SITE_OTHER): Payer: Medicare Other | Admitting: Surgery

## 2013-02-19 ENCOUNTER — Other Ambulatory Visit (INDEPENDENT_AMBULATORY_CARE_PROVIDER_SITE_OTHER): Payer: Medicare Other

## 2013-02-19 VITALS — BP 118/49 | HR 52 | Ht 64.0 in | Wt 275.0 lb

## 2013-02-19 DIAGNOSIS — I2510 Atherosclerotic heart disease of native coronary artery without angina pectoris: Secondary | ICD-10-CM

## 2013-02-19 DIAGNOSIS — E785 Hyperlipidemia, unspecified: Secondary | ICD-10-CM

## 2013-02-19 DIAGNOSIS — I6529 Occlusion and stenosis of unspecified carotid artery: Secondary | ICD-10-CM

## 2013-02-19 DIAGNOSIS — R0602 Shortness of breath: Secondary | ICD-10-CM

## 2013-02-19 DIAGNOSIS — I5032 Chronic diastolic (congestive) heart failure: Secondary | ICD-10-CM

## 2013-02-19 LAB — BRAIN NATRIURETIC PEPTIDE: Pro B Natriuretic peptide (BNP): 138 pg/mL — ABNORMAL HIGH (ref 0.0–100.0)

## 2013-02-19 LAB — BASIC METABOLIC PANEL
BUN: 23 mg/dL (ref 6–23)
CO2: 26 mEq/L (ref 19–32)
Calcium: 8.9 mg/dL (ref 8.4–10.5)
Chloride: 104 mEq/L (ref 96–112)
Creatinine, Ser: 0.9 mg/dL (ref 0.4–1.2)
GFR: 69.41 mL/min (ref >60.00)
Glucose, Bld: 221 mg/dL — ABNORMAL HIGH (ref 70–99)
Potassium: 4.5 mEq/L (ref 3.5–5.1)
Sodium: 139 mEq/L (ref 135–145)

## 2013-02-19 NOTE — Progress Notes (Signed)
Patient name: [name] [name] [name]: [id number] DOB: May 15, 1945 Sex: female     Chief Complaint  Patient presents with  . Re-evaluation    discuss L carotid stenting    HISTORY OF PRESENT ILLNESS: This is a patient of Dr. Deitra Mayo and Loralie Champagne,, who I have been asked to evaluate for carotid stenting.  Her history is as below, from Dr. Scot Dock is most recent note:   [name] [name] is a 68 y.o. female with a complicated history. I last saw her on 07/12/2012. At that time she had a 60-79% left carotid stenosis. There was no stenosis on the right. I had been considering a left carotid endarterectomy in the past after she had an episode that sounded like expressive aphasia. However she underwent a cerebral arteriogram in February of 2013 which showed that the stenosis was only approximately 50%. After discussing the case with her neurologist he felt that she was likely having migraine headaches and that the left carotid stenosis was not symptomatic. She has been on aspirin and Plavix.  Prior to her last visit it was noted that she had a right posterior cerebral artery ischemic stroke associated with vision loss in both eyes. An MRI during that workup suggested significant vertebral artery occlusive disease. I recommended arch aortogram and cerebral arteriography to focus on the vertebral basilar system as the potential cause of her stroke and dizziness at that time. Given that she might be a candidate for vertebral artery angioplasty and stenting we were going to arrange for Dr. Estanislado Pandy to perform this.  The patient underwent a cerebral arteriogram on 07/25/2012. She had a 70% stenosis of the right vertebral artery at its origin although there was unimpeded flow more distally to the cranial skull base and intracranially. She was also noted to have a 55-60% stenosis of the left internal carotid artery and a 3-5 mm saccular aneurysm of the left PCOM. At that time, Dr. Estanislado Pandy  recommended continuing her antiplatelet therapy with aggressive conservative management of her risk factors. If the patient continued to have symptoms then consideration could be given to an endovascular approach to her right vertebral artery stenosis. A 3 month follow up visit was arranged.  Since I saw her last, she continues to have intermittent episodes of expressive aphasia. This sometimes occurs more than once in one day, and at other times she can go several weeks without having any symptoms. He also has had some persistent dizziness which is not new. She denies focal weakness or paresthesias.  She has been seeing Dr. [name].  He would prefer carotid stenting as the patient will be relatively high risk for surgery given her frequent angina with coronary disease not amenable to intervention as well as active diastolic heart failure.  Past Medical History  Diagnosis Date  . Migraine   . Osteoarthritis   . Vomiting     persistent  . Asthma   . Ventral hernia     hx of  . Diabetes mellitus   . GERD (gastroesophageal reflux disease)   . Obesity 01-2010  . Hyperlipidemia   . Hypertension   . Irregular heart beat   . Myocardial infarction 2011    04/1999, 02/2000, 01/2005  . Coronary artery disease   . Heart murmur   . Shortness of breath   . Obstructive sleep apnea     no cpap- sleep study 5 yrs ago  . Anxiety   . PONV (postoperative nausea and vomiting)   .  Peripheral vascular disease     ? numbness, tingling arms and legs  . Anemia     hx  . H/O hiatal hernia   . Neuromuscular disorder     ?  Marland Kitchen Dysrhythmia   . Mental disorder     hx of confusion  . Chronic kidney disease     frequency  . Stroke 03/22/12    right side brain  . CHF (congestive heart failure)     Past Surgical History  Procedure Laterality Date  . Tumor removal  1970's    ovarian  . Leg surgery  1970's    right, rods and pins  . Hernia repair    . Angioplasty      multiple times  . Cholecystectomy     . Gastric stapling  1984  . Total abdominal hysterectomy  1970's  . Ankle fracture surgery  70's    lft   . Cardiac catheterization  01,02,05,06,07,08,11    stents 02  . Fracture surgery    . Appendectomy    . Coronary artery bypass graft  12/11  . Coronary angioplasty      stents  . Esophagogastroduodenoscopy  08/03/2011    Procedure: ESOPHAGOGASTRODUODENOSCOPY (EGD);  Surgeon: Shann Medal, MD;  Location: [facility];  Service: General;  Laterality: N/A;  . Gall stone removal  05/2003  . Root canal  10/2000  . Left heart cath  10/10/12    Dr [name].    History   Social History  . Marital Status: Married    Spouse Name: [name]     Number of Children: 1  . Years of Education: 12+   Occupational History  . retired    Social History Main Topics  . Smoking status: Never Smoker   . Smokeless tobacco: Never Used  . Alcohol Use: No  . Drug Use: No  . Sexual Activity: Not on file     Comment: hysterectomy   Other Topics Concern  . Not on file   Social History Narrative   Patient lives at home with husband [name].    Patient has 1 child.    Patient is not currently working.    Patient has 2 years of college.     Family History  Problem Relation Age of Onset  . Heart disease Father     Heart Disease before age 80  . Diabetes Father   . Hyperlipidemia Father   . Hypertension Father   . Heart attack Father   . Deep vein thrombosis Father   . Diabetes      family hx of paternal grandmother, aunt and grandfather- uncles  . Hypertension Mother   . Cancer Sister     Allergies as of 02/19/2013 - Review Complete 02/19/2013  Allergen Reaction Noted  . Amoxicillin Shortness Of Breath and Rash 10/30/2007  . Erythromycin Other (See Comments)   . Penicillins Shortness Of Breath and Rash   . Metformin Nausea Only and Other (See Comments) 02/23/2010  . Metformin and related  12/07/2011  . Tape Other (See Comments) 07/20/2012  . Isosorbide mononitrate [isosorbide]   11/09/2012    Current Outpatient Prescriptions on File Prior to Visit  Medication Sig Dispense Refill  . albuterol (PROVENTIL) (2.5 MG/3ML) 0.083% nebulizer solution Take 2.5 mg by nebulization every 6 (six) hours as needed. For shortness of breath.      Marland Kitchen albuterol (PROVENTIL,VENTOLIN) 90 MCG/ACT inhaler Inhale 2 puffs into the lungs every 6 (six) hours as needed. For shortness of  breath.      . ALPRAZolam (XANAX) 0.5 MG tablet Take 0.5 mg by mouth See admin instructions. For anxiety - Patient takes 1 in the morning and 1 in the evening daily. If needed she may take one in the afternoon.      Marland Kitchen amLODipine (NORVASC) 10 MG tablet Take 1 tablet (10 mg total) by mouth daily.  30 tablet  6  . aspirin EC 81 MG tablet Take 81 mg by mouth daily.      . B Complex-C (B-COMPLEX WITH VITAMIN C) tablet Take 1 tablet by mouth daily.      . Calcium 1500 MG tablet Take 1,500 mg by mouth daily.       . Cholecalciferol (VITAMIN D) 2000 UNITS tablet Take 2,000 Units by mouth daily.      . cloNIDine (CATAPRES) 0.2 MG tablet Take 0.2 mg by mouth 2 (two) times daily.      . clopidogrel (PLAVIX) 75 MG tablet Take 1 tablet (75 mg total) by mouth daily.  30 tablet  6  . FLUoxetine (PROZAC) 20 MG capsule Take 20 mg by mouth daily.      . Fluticasone-Salmeterol (ADVAIR DISKUS) 250-50 MCG/DOSE AEPB Inhale 1 puff into the lungs every morning. For asthma symptoms      . furosemide (LASIX) 40 MG tablet 1 and 1/2 tablets (total 60mg ) two times a day for 3 days, then decrease to 1 tablet (total 40mg ) two times a day.  90 tablet  3  . gabapentin (NEURONTIN) 600 MG tablet Take 600 mg by mouth at bedtime.       Marland Kitchen HUMALOG KWIKPEN 100 UNIT/ML SOPN 14 Units 2 (two) times daily with a meal.       . hydrALAZINE (APRESOLINE) 50 MG tablet Take 50 mg by mouth 3 (three) times daily as needed (for high blood pressure). Does not take dose if BP low      . HYDROcodone-acetaminophen (NORCO/VICODIN) 5-325 MG per tablet Take 1 tablet by mouth  every evening.       . insulin glargine (LANTUS) 100 UNIT/ML injection Inject 44 Units into the skin every morning. 44 units in the morning      . ipratropium-albuterol (DUONEB) 0.5-2.5 (3) MG/3ML SOLN Take 3 mLs by nebulization every 4 (four) hours as needed.      Marland Kitchen losartan (COZAAR) 100 MG tablet Take 100 mg by mouth 2 (two) times daily.       . Magnesium Oxide 250 MG TABS Take 1 tablet by mouth 2 (two) times daily.      . metoprolol (LOPRESSOR) 50 MG tablet 1 and 1/2 tablets (total 75mg ) two times a day  90 tablet  3  . Multiple Vitamin (MULTIVITAMIN) tablet Take 1 tablet by mouth daily.        . niacin (NIASPAN) 1000 MG CR tablet Take 1,000 mg by mouth at bedtime.      . nitroGLYCERIN (NITROSTAT) 0.3 MG SL tablet Place 0.3 mg under the tongue every 5 (five) minutes x 3 doses as needed for chest pain. Last taken on 11/02/12 (took 2 doses and chest pain resolved)      . omeprazole (PRILOSEC) 20 MG capsule Take 20 mg by mouth daily with breakfast.       . ondansetron (ZOFRAN) 4 MG tablet Take 4 mg by mouth 2 (two) times daily as needed. For nausea       . ONE TOUCH ULTRA TEST test strip       .  OVER THE COUNTER MEDICATION Take 250 mg by mouth daily. Magnesium tablets      . ranolazine (RANEXA) 1000 MG SR tablet Take 1 tablet (1,000 mg total) by mouth 2 (two) times daily.  60 tablet  6  . rosuvastatin (CRESTOR) 40 MG tablet Take 40 mg by mouth daily.       Marland Kitchen topiramate (TOPAMAX) 50 MG tablet Take 1 tablet (50 mg total) by mouth 2 (two) times daily.  60 tablet  6  . triamterene-hydrochlorothiazide (DYAZIDE) 37.5-25 MG per capsule Take 1 capsule by mouth every morning.       . vitamin B-12 (CYANOCOBALAMIN) 1000 MCG tablet Take 1,000 mcg by mouth daily.      Marland Kitchen zolpidem (AMBIEN) 10 MG tablet Take 10 mg by mouth at bedtime as needed for sleep.        No current facility-administered medications on file prior to visit.     REVIEW OF SYSTEMS: No change in review of symptoms from Dr. Nicole Cella  note  PHYSICAL EXAMINATION:   Vital signs are BP 118/49  Pulse 52  Ht 5\' 4"  (1.626 m)  Wt 275 lb (124.739 kg)  BMI 47.18 kg/m2  SpO2 95% General: The patient appears their stated age. HEENT:  No gross abnormalities Pulmonary:  Non labored breathing Musculoskeletal: There are no major deformities. Neurologic: No focal weakness or paresthesias are detected, Skin: There are no ulcer or rashes noted. Psychiatric: The patient has normal affect. Cardiovascular: There is a regular rate and rhythm without significant murmur appreciated.  I did not hear any carotid bruits.  She has palpable pedal pulses   Diagnostic Studies I have reviewed her angiogram which shows a bovine aortic arch.  I have also reviewed her selected carotid imaging studies.  Assessment:  symptomatic left carotid stenosis Plan: I have reviewed her treatment options which include stenting vs. endarterectomy.  Because of her coronary disease, Dr. [name], her cardiologist has recommended stenting if possible.  We discussed the risks and benefits of the operation which include but are not limited to bleeding from the cannulation site and a stroke.  The patient wishes to have this done as she continues to have daily episodes of expressive aphasia.  These have been going on for almost a year.  She is very concerned about having a stroke.  She does have plans to take care of her mother within the next 3 weeks if she transitions from a nursing facility to home.  Therefore she would like to have this procedure schedule for 3 weeks from now.  I told her to continue her aspirin and Plavix.  Eldridge Abrahams, M.D. [facility] [place] [facility] Office: [phone] Pager:  [phone]

## 2013-02-21 ENCOUNTER — Encounter (HOSPITAL_COMMUNITY)
Admission: RE | Admit: 2013-02-21 | Discharge: 2013-02-21 | Disposition: A | Discharge status: Home / Self Care | Payer: Medicare Other | Source: Ambulatory Visit | Attending: Cardiology | Admitting: Cardiology

## 2013-02-23 ENCOUNTER — Encounter (HOSPITAL_COMMUNITY)
Admission: RE | Admit: 2013-02-23 | Discharge: 2013-02-23 | Disposition: A | Discharge status: Home / Self Care | Payer: Medicare Other | Source: Ambulatory Visit | Attending: Cardiology | Admitting: Cardiology

## 2013-02-26 ENCOUNTER — Encounter (HOSPITAL_COMMUNITY)
Admission: RE | Admit: 2013-02-26 | Discharge: 2013-02-26 | Disposition: A | Discharge status: Home / Self Care | Payer: Medicare Other | Source: Ambulatory Visit | Attending: Cardiology | Admitting: Cardiology

## 2013-02-28 ENCOUNTER — Encounter (HOSPITAL_COMMUNITY)
Admission: RE | Admit: 2013-02-28 | Discharge: 2013-02-28 | Disposition: A | Discharge status: Home / Self Care | Payer: Medicare Other | Source: Ambulatory Visit | Attending: Cardiology | Admitting: Cardiology

## 2013-03-02 ENCOUNTER — Encounter (HOSPITAL_COMMUNITY)
Admission: RE | Admit: 2013-03-02 | Discharge: 2013-03-02 | Disposition: A | Discharge status: Home / Self Care | Payer: Medicare Other | Source: Ambulatory Visit | Attending: Cardiology | Admitting: Cardiology

## 2013-03-04 HISTORY — PX: CAROTID ENDARTERECTOMY: SUR193

## 2013-03-05 ENCOUNTER — Encounter (HOSPITAL_COMMUNITY)
Admission: RE | Admit: 2013-03-05 | Discharge: 2013-03-05 | Disposition: A | Discharge status: Home / Self Care | Payer: Medicare Other | Source: Ambulatory Visit | Attending: Cardiology | Admitting: Cardiology

## 2013-03-05 DIAGNOSIS — Z5189 Encounter for other specified aftercare: Secondary | ICD-10-CM | POA: Insufficient documentation

## 2013-03-05 DIAGNOSIS — I2510 Atherosclerotic heart disease of native coronary artery without angina pectoris: Secondary | ICD-10-CM | POA: Insufficient documentation

## 2013-03-05 DIAGNOSIS — I252 Old myocardial infarction: Secondary | ICD-10-CM | POA: Diagnosis not present

## 2013-03-05 DIAGNOSIS — Z951 Presence of aortocoronary bypass graft: Secondary | ICD-10-CM | POA: Insufficient documentation

## 2013-03-05 NOTE — Progress Notes (Signed)
Spoke with pt. Pt brought in [employer]'s leg cramp supplement for this writer to review. Pt encouraged to not take the supplement until she talks with her MD or Pharmacist. Pt expressed understanding. Continue client-centered nutrition education by RD as part of interdisciplinary care.  Monitor and evaluate progress toward nutrition goal with team.  Derek Mound, M.Ed, RD, LDN, CDE 03/05/2013 10:18 AM

## 2013-03-07 ENCOUNTER — Encounter (HOSPITAL_COMMUNITY)
Admission: RE | Admit: 2013-03-07 | Discharge: 2013-03-07 | Disposition: A | Discharge status: Home / Self Care | Payer: Medicare Other | Source: Ambulatory Visit | Attending: Cardiology | Admitting: Cardiology

## 2013-03-07 DIAGNOSIS — Z5189 Encounter for other specified aftercare: Secondary | ICD-10-CM | POA: Diagnosis not present

## 2013-03-09 ENCOUNTER — Encounter (HOSPITAL_COMMUNITY)
Admission: RE | Admit: 2013-03-09 | Discharge: 2013-03-09 | Disposition: A | Discharge status: Home / Self Care | Payer: Medicare Other | Source: Ambulatory Visit | Attending: Cardiology | Admitting: Cardiology

## 2013-03-09 DIAGNOSIS — Z5189 Encounter for other specified aftercare: Secondary | ICD-10-CM | POA: Diagnosis not present

## 2013-03-09 NOTE — Progress Notes (Signed)
Pt returned to exercise today with forms completed for advance directives/living will.  Pastoral care called for assistance with notarizing the document with two non hospital employees witness.  Pt asked questions regarding override her declared wishes.  Advised pt the she could indicate that she did not want her wishes to be overridden by her spouse.  Pt verbalized understanding and is in agreement of this.  Pt also reported that she received a call regarding a mass to her Right breast. Pt returned to the breast center for additional scans.  Pt was advised to return in 6 months for a repeat mammogram because this was probably "benigh mass".  Pt did not feel comfortable with this and plans to contact Dr. Genevie Ann for possibility of biopsy prior to her scheduled surgery in a couple of weeks.  Pt becamed teary eyed while speaking.  Pt given emotional support and prayer.  Will follow up with pt on Monday.  Cherre Huger, BSN

## 2013-03-12 ENCOUNTER — Encounter (HOSPITAL_COMMUNITY): Payer: Medicare Other

## 2013-03-13 ENCOUNTER — Encounter: Payer: Self-pay | Admitting: Cardiology

## 2013-03-13 ENCOUNTER — Ambulatory Visit (INDEPENDENT_AMBULATORY_CARE_PROVIDER_SITE_OTHER): Payer: Medicare Other | Admitting: Cardiology

## 2013-03-13 VITALS — BP 160/82 | HR 59 | Ht 64.0 in | Wt 276.0 lb

## 2013-03-13 DIAGNOSIS — E785 Hyperlipidemia, unspecified: Secondary | ICD-10-CM

## 2013-03-13 DIAGNOSIS — I5032 Chronic diastolic (congestive) heart failure: Secondary | ICD-10-CM

## 2013-03-13 DIAGNOSIS — I10 Essential (primary) hypertension: Secondary | ICD-10-CM

## 2013-03-13 DIAGNOSIS — I509 Heart failure, unspecified: Secondary | ICD-10-CM

## 2013-03-13 DIAGNOSIS — I6529 Occlusion and stenosis of unspecified carotid artery: Secondary | ICD-10-CM

## 2013-03-13 DIAGNOSIS — I2510 Atherosclerotic heart disease of native coronary artery without angina pectoris: Secondary | ICD-10-CM

## 2013-03-13 MED ORDER — EZETIMIBE 10 MG PO TABS: 10.0000 mg | ORAL_TABLET | Freq: Every day | ORAL | Status: DC

## 2013-03-13 NOTE — Patient Instructions (Addendum)
Start Zetia 10mg  daily.  Your physician recommends that you return for a FASTING lipid profile /liver profile in 2 months. DO NOT EAT OR DRINK AFTER MIDNIGHT THE NIGHT BEFORE  Your physician recommends that you schedule a follow-up appointment in: 3 months with Dr [name].   You have been referred to Dr Theadore Nan patient evaluation.

## 2013-03-14 ENCOUNTER — Encounter (HOSPITAL_COMMUNITY)
Admission: RE | Admit: 2013-03-14 | Discharge: 2013-03-14 | Disposition: A | Discharge status: Home / Self Care | Payer: Medicare Other | Source: Ambulatory Visit | Attending: Cardiology | Admitting: Cardiology

## 2013-03-14 ENCOUNTER — Encounter (HOSPITAL_COMMUNITY): Payer: Self-pay | Admitting: Pharmacist

## 2013-03-14 ENCOUNTER — Other Ambulatory Visit: Payer: Self-pay

## 2013-03-14 DIAGNOSIS — Z5189 Encounter for other specified aftercare: Secondary | ICD-10-CM | POA: Diagnosis not present

## 2013-03-14 NOTE — Progress Notes (Signed)
Patient ID: [name] [name], female   DOB: 02-26-1945, 68 y.o.   [name]: [id number] PCP: Dr. Sandi Mariscal  68 yo with history of CAD s/p CABG, diastolic CHF, and cerebrovascular disease with history of CVA presents for cardiology followup. She had CABG x 5 in 12/11.  Prior to the CABG she had multiple PCIs.  She had a CVA in 2/14 that presented as visual loss.  She ended up getting a cerebral angiogram in 6/14 with 70% ostial right vertebral stenosis, > 70% stenosis proximal left posterior cerebral artery, and 123456 LICA stenosis.  She has been on ASA and Plavix.  She recently saw Dr. Scot Dock, and carotid dopplers suggested worsening of LICA stenosis to > 123XX123.  She has had episodes of transient expressive aphasia for several months.  She is planned for a carotid stent soon.  Given dyspnea and chest pain that was worsened from her baseline, I took [name] [name] for left heart catheterization in 9/14.  This showed patent SVG-D, LIMA-LAD, and sequential SVG-OM branches but SVG-RCA and the native RCA were both totally occluded.  I suspect that her increased symptoms coincided with occlusion of SVG-RCA.  I started her on Imdur to see if this would help with the chest pain and dyspnea.  However, she feels like Imdur causes leg cramps and does not think that she can take it.  I next started her on ranolazine 500 mg bid and titrated this up to 1000 mg bid.  She says that this has helped the exertional chest pressure though it has not resolved it.  Blood pressure has been under good control recently.  It is high today but has been good at cardiac rehab.  There was some concern at rehab for bradycardia, so metoprolol has been cut back to 50 mg bid.    She continues to have mid chest pain on most days.  She continues to get chest pain walking up steps or across a parking lot and when she does the laundary.  It does not occur at rest and will resolve with rest.  It is typically mild.  She gets short of breath with about the same  degree of exertion that will bring on chest discomfort.  She has been taking all her medications. Weight is stable. Of note, she is on Crestor 40 mg daily but still has very high LDL.  She reports compliance with Crestor.  She was recently started on cholestyramine.     ECG: NSR, RAE, LBBB  Labs (6/14): K 3.8, creatinine 0.71 Labs (8/14): BNP 249, LDL 166 Labs (9/14): K 4.7, creatinine 0.7 Labs (9/14): K 4.6, creatinine 0.9, BNP 109 Labs (1/15): K 4.5, creatinine 0.73, LDL 212, HCT 43.4, TSH normal, BNP 138  PMH: 1. Diabetic gastroparesis 2. Type II diabetes 3. HTN 4. Morbid obesity 5. CAD: s/p CABG in 12/11 after prior PCIs.  LIMA-LAD, SVG-D, seq SVG-OM1 and OM2, SVG-PDA. Adenosine Cardiolite (8/14) with EF 53% and a small reversible apical defect with a medium, partially reversible inferior defect.  LHC (9/14) with patent SVG-D, LIMA-LAD (50% distal LAD), and sequential SVG-OM branches; the SVG-RCA and the native RCA were occluded (medical management).  6. Atypical migraines 7. OSA: Intolerant of CPAP.  8. GERD with hiatal hernia 9. OA 10. Diastolic CHF: Echo (A999333) with EF 50-55%, grade II diastolic dysfunction, mild-moderate MR.  Echo (8/14) with EF 55-60%, grade II diastolic dysfunction, mildly increased aortic valve gradient (mean 12 mmHg) but valve opens well, mild MR and mild RV dilation.  11. CKD 12. Chronic LBBB 13. Anxiety 14. Carotid stenosis: Followed by [facility], XX123456 LICA stenosis [id number].  15. Cerebrovascular disease: CVA 2/14 with right posterior cerebral artery territory ischemic infarction. Cerebral angiogram in 6/14 showed 70% right vertebral ostial stenosis, 123456 LICA stenosis, > XX123456 proximal left posterior cerebral artery stenosis, posterior communicating artery aneurysm.  Patient has had episodes of transient expressive aphasia.  Carotid dopplers (XX123456) showed XX123456 LICA stenosis.  16. Positional vertigo (suspected)  SH: Married, nonsmoker  FH: CAD  ROS: All systems  reviewed and negative except as per HPI.   Current Outpatient Prescriptions  Medication Sig Dispense Refill  . albuterol (PROVENTIL) (2.5 MG/3ML) 0.083% nebulizer solution Take 2.5 mg by nebulization every 6 (six) hours as needed for shortness of breath (when asthma is active). If severe asthma , use q4h around the clock x10 days.  For shortness of breath.      Marland Kitchen albuterol (PROVENTIL,VENTOLIN) 90 MCG/ACT inhaler Inhale 2 puffs into the lungs every 6 (six) hours as needed. For shortness of breath.      . ALPRAZolam (XANAX) 0.5 MG tablet Take 0.5 mg by mouth 3 (three) times daily as needed. For anxiety - Patient takes 1 in the morning and 1 in the evening daily. If needed she may take one in the afternoon.      Marland Kitchen amLODipine (NORVASC) 10 MG tablet Take 1 tablet (10 mg total) by mouth daily.  30 tablet  6  . aspirin EC 81 MG tablet Take 81 mg by mouth daily.      . B Complex-C (B-COMPLEX WITH VITAMIN C) tablet Take 1 tablet by mouth daily.      . Cholecalciferol (VITAMIN D) 2000 UNITS tablet Take 2,000 Units by mouth daily.      . cloNIDine (CATAPRES) 0.2 MG tablet Take 0.2 mg by mouth 2 (two) times daily.      . clopidogrel (PLAVIX) 75 MG tablet Take 1 tablet (75 mg total) by mouth daily.  30 tablet  6  . FLUoxetine (PROZAC) 20 MG capsule Take 20 mg by mouth daily.      . Fluticasone-Salmeterol (ADVAIR DISKUS) 250-50 MCG/DOSE AEPB Inhale 1 puff into the lungs every morning. For asthma symptoms      . gabapentin (NEURONTIN) 600 MG tablet Take 600 mg by mouth at bedtime.       Marland Kitchen HUMALOG KWIKPEN 100 UNIT/ML SOPN Inject 5 Units into the skin 2 (two) times daily with a meal.       . hydrALAZINE (APRESOLINE) 50 MG tablet Take 50 mg by mouth 3 (three) times daily as needed (for high blood pressure). Does not take dose if BP low      . HYDROcodone-acetaminophen (NORCO/VICODIN) 5-325 MG per tablet Take 1 tablet by mouth every evening.       . insulin glargine (LANTUS) 100 UNIT/ML injection Inject 44 Units  into the skin every morning.       Marland Kitchen losartan (COZAAR) 100 MG tablet Take 100 mg by mouth 2 (two) times daily.       . Magnesium Oxide 250 MG TABS Take 1 tablet by mouth 2 (two) times daily.      . metoprolol (LOPRESSOR) 50 MG tablet 25 mg 2 (two) times daily.       . Multiple Vitamin (MULTIVITAMIN) tablet Take 1 tablet by mouth daily.        . nitroGLYCERIN (NITROSTAT) 0.3 MG SL tablet Place 0.3 mg under the tongue every 5 (five) minutes x 3  doses as needed for chest pain. Last taken on 11/02/12 (took 2 doses and chest pain resolved)      . omeprazole (PRILOSEC) 20 MG capsule Take 20 mg by mouth daily with breakfast.       . ondansetron (ZOFRAN) 4 MG tablet Take 4 mg by mouth 2 (two) times daily as needed. For nausea       . ranolazine (RANEXA) 1000 MG SR tablet Take 1 tablet (1,000 mg total) by mouth 2 (two) times daily.  60 tablet  6  . rosuvastatin (CRESTOR) 40 MG tablet Take 40 mg by mouth daily.       Marland Kitchen topiramate (TOPAMAX) 50 MG tablet Take 1 tablet (50 mg total) by mouth 2 (two) times daily.  60 tablet  6  . triamterene-hydrochlorothiazide (DYAZIDE) 37.5-25 MG per capsule Take 1 capsule by mouth every morning.       . vitamin B-12 (CYANOCOBALAMIN) 1000 MCG tablet Take 1,000 mcg by mouth daily.      Marland Kitchen zolpidem (AMBIEN) 10 MG tablet Take 10 mg by mouth at bedtime as needed for sleep.       . calcium carbonate 200 MG capsule Take 1,000 mg by mouth daily with breakfast.      . cholestyramine (QUESTRAN) 4 GM/DOSE powder 2 scoops two times a day (mixed in juice or light kool-aid).      . ezetimibe (ZETIA) 10 MG tablet Take 1 tablet (10 mg total) by mouth daily.  30 tablet  3  . furosemide (LASIX) 40 MG tablet Take 40 mg by mouth daily.      Marland Kitchen HYDROcodone-acetaminophen (NORCO/VICODIN) 5-325 MG per tablet Take 1 tablet by mouth at bedtime as needed (for knee pain to help sleep).      . insulin glargine (LANTUS) 100 UNIT/ML injection Inject 4-16 Units into the skin at bedtime as needed (For blood  sugar = or >165).      Marland Kitchen loperamide (IMODIUM) 2 MG capsule Take 2 mg by mouth as needed for diarrhea or loose stools.       No current facility-administered medications for this visit.    BP 160/82  Pulse 59  Ht 5\' 4"  (1.626 m)  Wt 125.193 kg (276 lb)  BMI 47.35 kg/m2 General: NAD, obese Neck: No JVD, no thyromegaly or thyroid nodule.  Lungs: Occasional squeaks noted on lung exam.  CV: Nondisplaced PMI.  Heart regular S1/S2 with wide S2 split, no S3/S4, 1/6 early SEM.  1+ edema at ankles.  No carotid bruit.  Normal pedal pulses.  Abdomen: Soft, nontender, no hepatosplenomegaly, no distention.  Skin: Intact without lesions or rashes.  Neurologic: Alert and oriented x 3.  Psych: Normal affect. Extremities: No clubbing or cyanosis.   Assessment/Plan: 1. CAD: Occluded native RCA and SVG-RCA.  The occlusion of SVG-RCA is the likely cause of her angina (still CCS class III).  Cardiolite showed a partially reversible inferior defect.  She has not tolerated Imdur as she thinks that it has caused cramping.  Ranolazine has helped angina but has not resolved it. She is unable to increase metoprolol due to low HR and is on maximal amlodipine. - Continue ASA 81, statin, Plavix 75, losartan, Crestor, amlodipine metoprolol, and ranolazine.  - I reviewed her last cardiac cath with Dr. Irish Lack.  The native RCA may be favorable for CTO opening.  This could potentially help her intractable angina.  I am going to refer her to Dr. Irish Lack to explore RCA CTO opening.  2. Chronic diastolic  CHF: Volume status looks better, no JVD.  NYHA class III symptoms.  Continue current Lasix dosing.  3. Hyperlipidemia: LDL 212 on Crestor 40.  This suggests a familial hyperlipidemia.  She was started on cholestyramine and I will add Zetia 10 mg daily.  If there is not significant LDL improvement on lipid profile in 2 months, I will refer her to lipid clinic.  4. Hypertension: BP is improved.  Continue current regimen.  5.  Cerebrovascular disease: She has history of CVA and is now having episodes of transient expressive aphasia.  She is on Plavix.  She has > 123XX123 LICA stenosis as well as significant vertebral stenosis.  I favor carotid stenting as this patient will be relatively high risk for surgery (frequent angina with known severe coronary disease, OSA, diastolic CHF).   She will undergo carotid stent with Drs Trula Slade and Gwenlyn Found next week.    Loralie Champagne 03/14/2013

## 2013-03-16 ENCOUNTER — Encounter: Payer: Self-pay | Admitting: Cardiology

## 2013-03-16 ENCOUNTER — Encounter (HOSPITAL_COMMUNITY)
Admission: RE | Admit: 2013-03-16 | Discharge: 2013-03-16 | Disposition: A | Discharge status: Home / Self Care | Payer: Medicare Other | Source: Ambulatory Visit | Attending: Cardiology | Admitting: Cardiology

## 2013-03-16 DIAGNOSIS — Z5189 Encounter for other specified aftercare: Secondary | ICD-10-CM | POA: Diagnosis not present

## 2013-03-20 ENCOUNTER — Encounter (HOSPITAL_COMMUNITY)
Admission: RE | Disposition: A | Discharge status: Home / Self Care | Payer: Self-pay | Source: Ambulatory Visit | Attending: Surgery

## 2013-03-20 ENCOUNTER — Encounter (HOSPITAL_COMMUNITY): Payer: Self-pay | Admitting: *Deleted

## 2013-03-20 ENCOUNTER — Inpatient Hospital Stay (HOSPITAL_COMMUNITY)
Admission: RE | Admit: 2013-03-20 | Discharge: 2013-03-21 | DRG: 035 | Disposition: A | Discharge status: Home / Self Care | Payer: Medicare Other | Source: Ambulatory Visit | Attending: Surgery | Admitting: Surgery

## 2013-03-20 ENCOUNTER — Other Ambulatory Visit: Payer: Self-pay | Admitting: *Deleted

## 2013-03-20 DIAGNOSIS — Z951 Presence of aortocoronary bypass graft: Secondary | ICD-10-CM | POA: Diagnosis not present

## 2013-03-20 DIAGNOSIS — K219 Gastro-esophageal reflux disease without esophagitis: Secondary | ICD-10-CM | POA: Diagnosis present

## 2013-03-20 DIAGNOSIS — I059 Rheumatic mitral valve disease, unspecified: Secondary | ICD-10-CM | POA: Diagnosis present

## 2013-03-20 DIAGNOSIS — I129 Hypertensive chronic kidney disease with stage 1 through stage 4 chronic kidney disease, or unspecified chronic kidney disease: Secondary | ICD-10-CM | POA: Diagnosis present

## 2013-03-20 DIAGNOSIS — Z794 Long term (current) use of insulin: Secondary | ICD-10-CM | POA: Diagnosis not present

## 2013-03-20 DIAGNOSIS — Z9884 Bariatric surgery status: Secondary | ICD-10-CM | POA: Diagnosis not present

## 2013-03-20 DIAGNOSIS — I6529 Occlusion and stenosis of unspecified carotid artery: Secondary | ICD-10-CM

## 2013-03-20 DIAGNOSIS — E785 Hyperlipidemia, unspecified: Secondary | ICD-10-CM | POA: Diagnosis present

## 2013-03-20 DIAGNOSIS — N189 Chronic kidney disease, unspecified: Secondary | ICD-10-CM | POA: Diagnosis present

## 2013-03-20 DIAGNOSIS — Z9861 Coronary angioplasty status: Secondary | ICD-10-CM | POA: Diagnosis not present

## 2013-03-20 DIAGNOSIS — Z7982 Long term (current) use of aspirin: Secondary | ICD-10-CM | POA: Diagnosis not present

## 2013-03-20 DIAGNOSIS — F411 Generalized anxiety disorder: Secondary | ICD-10-CM | POA: Diagnosis present

## 2013-03-20 DIAGNOSIS — Z79899 Other long term (current) drug therapy: Secondary | ICD-10-CM

## 2013-03-20 DIAGNOSIS — I5032 Chronic diastolic (congestive) heart failure: Secondary | ICD-10-CM | POA: Diagnosis present

## 2013-03-20 DIAGNOSIS — I2510 Atherosclerotic heart disease of native coronary artery without angina pectoris: Secondary | ICD-10-CM | POA: Diagnosis present

## 2013-03-20 DIAGNOSIS — E669 Obesity, unspecified: Secondary | ICD-10-CM | POA: Diagnosis present

## 2013-03-20 DIAGNOSIS — R4701 Aphasia: Secondary | ICD-10-CM | POA: Diagnosis present

## 2013-03-20 DIAGNOSIS — Z6841 Body Mass Index (BMI) 40.0 and over, adult: Secondary | ICD-10-CM | POA: Diagnosis not present

## 2013-03-20 DIAGNOSIS — I252 Old myocardial infarction: Secondary | ICD-10-CM | POA: Diagnosis not present

## 2013-03-20 DIAGNOSIS — I509 Heart failure, unspecified: Secondary | ICD-10-CM | POA: Diagnosis present

## 2013-03-20 DIAGNOSIS — G4733 Obstructive sleep apnea (adult) (pediatric): Secondary | ICD-10-CM | POA: Diagnosis present

## 2013-03-20 DIAGNOSIS — Z8673 Personal history of transient ischemic attack (TIA), and cerebral infarction without residual deficits: Secondary | ICD-10-CM | POA: Diagnosis not present

## 2013-03-20 DIAGNOSIS — Z48812 Encounter for surgical aftercare following surgery on the circulatory system: Secondary | ICD-10-CM

## 2013-03-20 DIAGNOSIS — Z7902 Long term (current) use of antithrombotics/antiplatelets: Secondary | ICD-10-CM | POA: Diagnosis not present

## 2013-03-20 DIAGNOSIS — E119 Type 2 diabetes mellitus without complications: Secondary | ICD-10-CM | POA: Diagnosis present

## 2013-03-20 DIAGNOSIS — Z5189 Encounter for other specified aftercare: Secondary | ICD-10-CM | POA: Diagnosis not present

## 2013-03-20 HISTORY — PX: CAROTID STENT INSERTION: SHX5505

## 2013-03-20 LAB — POCT I-STAT, CHEM 8
BUN: 30 mg/dL — ABNORMAL HIGH (ref 6–23)
Calcium, Ion: 1.2 mmol/L (ref 1.13–1.30)
Chloride: 102 mEq/L (ref 96–112)
Creatinine, Ser: 0.9 mg/dL (ref 0.50–1.10)
Glucose, Bld: 199 mg/dL — ABNORMAL HIGH (ref 70–99)
HCT: 43 % (ref 36.0–46.0)
Hemoglobin: 14.6 g/dL (ref 12.0–15.0)
Potassium: 3.9 mEq/L (ref 3.7–5.3)
Sodium: 141 mEq/L (ref 137–147)
TCO2: 28 mmol/L (ref 0–100)

## 2013-03-20 LAB — POCT ACTIVATED CLOTTING TIME
Activated Clotting Time: 337 seconds
Activated Clotting Time: 348 seconds

## 2013-03-20 LAB — GLUCOSE, CAPILLARY
Glucose-Capillary: 151 mg/dL — ABNORMAL HIGH (ref 70–99)
Glucose-Capillary: 110 mg/dL — ABNORMAL HIGH (ref 70–99)

## 2013-03-20 SURGERY — CAROTID STENT INSERTION
Anesthesia: LOCAL | Laterality: Left

## 2013-03-20 MED ORDER — ASPIRIN EC 81 MG PO TBEC
81.0000 mg | DELAYED_RELEASE_TABLET | Freq: Every day | ORAL | Status: DC
  Administered 2013-03-21: 81 mg via ORAL
  Filled 2013-03-20: qty 1

## 2013-03-20 MED ORDER — TRIAMTERENE-HCTZ 37.5-25 MG PO CAPS
1.0000 | ORAL_CAPSULE | Freq: Every morning | ORAL | Status: DC
  Administered 2013-03-21: 1 via ORAL
  Filled 2013-03-20: qty 1

## 2013-03-20 MED ORDER — ASPIRIN 81 MG PO CHEW: 81.0000 mg | CHEWABLE_TABLET | Freq: Every day | ORAL | Status: DC

## 2013-03-20 MED ORDER — HYDRALAZINE HCL 20 MG/ML IJ SOLN: 10.0000 mg | INTRAMUSCULAR | Status: DC | PRN

## 2013-03-20 MED ORDER — GABAPENTIN 600 MG PO TABS
600.0000 mg | ORAL_TABLET | Freq: Every day | ORAL | Status: DC
  Filled 2013-03-20 (×2): qty 1

## 2013-03-20 MED ORDER — SODIUM CHLORIDE 0.9 % IV SOLN: INTRAVENOUS | Status: DC

## 2013-03-20 MED ORDER — METOPROLOL TARTRATE 25 MG PO TABS
25.0000 mg | ORAL_TABLET | Freq: Two times a day (BID) | ORAL | Status: DC
  Filled 2013-03-20 (×3): qty 1

## 2013-03-20 MED ORDER — LOPERAMIDE HCL 2 MG PO CAPS
2.0000 mg | ORAL_CAPSULE | ORAL | Status: DC | PRN
  Filled 2013-03-20: qty 1

## 2013-03-20 MED ORDER — ACETAMINOPHEN 325 MG PO TABS: 325.0000 mg | ORAL_TABLET | ORAL | Status: DC | PRN

## 2013-03-20 MED ORDER — LIDOCAINE HCL (PF) 1 % IJ SOLN
INTRAMUSCULAR | Status: AC
  Filled 2013-03-20: qty 30

## 2013-03-20 MED ORDER — ALUM & MAG HYDROXIDE-SIMETH 200-200-20 MG/5ML PO SUSP: 15.0000 mL | ORAL | Status: DC | PRN

## 2013-03-20 MED ORDER — NITROGLYCERIN 0.4 MG SL SUBL: 0.4000 mg | SUBLINGUAL_TABLET | SUBLINGUAL | Status: DC | PRN

## 2013-03-20 MED ORDER — INSULIN ASPART 100 UNIT/ML ~~LOC~~ SOLN
5.0000 [IU] | Freq: Two times a day (BID) | SUBCUTANEOUS | Status: DC
  Administered 2013-03-21: 5 [IU] via SUBCUTANEOUS

## 2013-03-20 MED ORDER — GUAIFENESIN-DM 100-10 MG/5ML PO SYRP: 15.0000 mL | ORAL_SOLUTION | ORAL | Status: DC | PRN

## 2013-03-20 MED ORDER — PHENOL 1.4 % MT LIQD: 1.0000 | OROMUCOSAL | Status: DC | PRN

## 2013-03-20 MED ORDER — SODIUM CHLORIDE 0.9 % IV SOLN
0.2500 mg/kg/h | INTRAVENOUS | Status: DC
  Filled 2013-03-20 (×2): qty 250

## 2013-03-20 MED ORDER — AMLODIPINE BESYLATE 10 MG PO TABS
10.0000 mg | ORAL_TABLET | Freq: Every day | ORAL | Status: DC
  Administered 2013-03-21: 10 mg via ORAL
  Filled 2013-03-20 (×2): qty 1

## 2013-03-20 MED ORDER — ONE-DAILY MULTI VITAMINS PO TABS: 1.0000 | ORAL_TABLET | Freq: Every day | ORAL | Status: DC

## 2013-03-20 MED ORDER — ALBUTEROL SULFATE (2.5 MG/3ML) 0.083% IN NEBU: 2.5000 mg | INHALATION_SOLUTION | Freq: Four times a day (QID) | RESPIRATORY_TRACT | Status: DC | PRN

## 2013-03-20 MED ORDER — MOMETASONE FURO-FORMOTEROL FUM 100-5 MCG/ACT IN AERO
2.0000 | INHALATION_SPRAY | Freq: Two times a day (BID) | RESPIRATORY_TRACT | Status: DC
  Filled 2013-03-20 (×2): qty 8.8

## 2013-03-20 MED ORDER — TOPIRAMATE 25 MG PO TABS
50.0000 mg | ORAL_TABLET | Freq: Two times a day (BID) | ORAL | Status: DC
  Filled 2013-03-20 (×3): qty 2

## 2013-03-20 MED ORDER — CLONIDINE HCL 0.2 MG PO TABS
0.2000 mg | ORAL_TABLET | Freq: Two times a day (BID) | ORAL | Status: DC
  Filled 2013-03-20 (×3): qty 1

## 2013-03-20 MED ORDER — ALBUTEROL SULFATE (2.5 MG/3ML) 0.083% IN NEBU: 3.0000 mL | INHALATION_SOLUTION | Freq: Four times a day (QID) | RESPIRATORY_TRACT | Status: DC | PRN

## 2013-03-20 MED ORDER — ACETAMINOPHEN 650 MG RE SUPP: 325.0000 mg | RECTAL | Status: DC | PRN

## 2013-03-20 MED ORDER — DOPAMINE-DEXTROSE 3.2-5 MG/ML-% IV SOLN
5.0000 ug/kg/min | INTRAVENOUS | Status: DC
  Administered 2013-03-20: 5 ug/kg/min via INTRAVENOUS

## 2013-03-20 MED ORDER — ONDANSETRON HCL 4 MG/2ML IJ SOLN
4.0000 mg | Freq: Four times a day (QID) | INTRAMUSCULAR | Status: DC | PRN
  Administered 2013-03-20 – 2013-03-21 (×3): 4 mg via INTRAVENOUS
  Filled 2013-03-20 (×3): qty 2

## 2013-03-20 MED ORDER — ATORVASTATIN CALCIUM 10 MG PO TABS
10.0000 mg | ORAL_TABLET | Freq: Every day | ORAL | Status: DC
  Administered 2013-03-20: 10 mg via ORAL
  Filled 2013-03-20 (×2): qty 1

## 2013-03-20 MED ORDER — ADULT MULTIVITAMIN W/MINERALS CH
1.0000 | ORAL_TABLET | Freq: Every day | ORAL | Status: DC
  Filled 2013-03-20 (×2): qty 1

## 2013-03-20 MED ORDER — ATROPINE SULFATE 0.1 MG/ML IJ SOLN
INTRAMUSCULAR | Status: AC
  Filled 2013-03-20: qty 10

## 2013-03-20 MED ORDER — DOPAMINE-DEXTROSE 3.2-5 MG/ML-% IV SOLN
INTRAVENOUS | Status: AC
  Administered 2013-03-20: 5 ug/kg/min via INTRAVENOUS
  Filled 2013-03-20: qty 250

## 2013-03-20 MED ORDER — INSULIN GLARGINE 100 UNIT/ML ~~LOC~~ SOLN
44.0000 [IU] | Freq: Every morning | SUBCUTANEOUS | Status: DC
  Administered 2013-03-21: 44 [IU] via SUBCUTANEOUS
  Filled 2013-03-20: qty 0.44

## 2013-03-20 MED ORDER — CLOPIDOGREL BISULFATE 75 MG PO TABS
ORAL_TABLET | ORAL | Status: AC
  Filled 2013-03-20: qty 1

## 2013-03-20 MED ORDER — BIVALIRUDIN 250 MG IV SOLR
INTRAVENOUS | Status: AC
  Filled 2013-03-20: qty 250

## 2013-03-20 MED ORDER — EZETIMIBE 10 MG PO TABS
10.0000 mg | ORAL_TABLET | Freq: Every day | ORAL | Status: DC
  Administered 2013-03-21: 10 mg via ORAL
  Filled 2013-03-20 (×2): qty 1

## 2013-03-20 MED ORDER — LOSARTAN POTASSIUM 50 MG PO TABS
100.0000 mg | ORAL_TABLET | Freq: Two times a day (BID) | ORAL | Status: DC
  Administered 2013-03-21: 100 mg via ORAL
  Filled 2013-03-20 (×3): qty 2

## 2013-03-20 MED ORDER — CLOPIDOGREL BISULFATE 75 MG PO TABS
75.0000 mg | ORAL_TABLET | Freq: Every day | ORAL | Status: DC
  Administered 2013-03-21: 75 mg via ORAL
  Filled 2013-03-20 (×2): qty 1

## 2013-03-20 MED ORDER — ZOLPIDEM TARTRATE 5 MG PO TABS: 5.0000 mg | ORAL_TABLET | Freq: Every evening | ORAL | Status: DC | PRN

## 2013-03-20 MED ORDER — HYDROCODONE-ACETAMINOPHEN 5-325 MG PO TABS: 1.0000 | ORAL_TABLET | Freq: Every evening | ORAL | Status: DC

## 2013-03-20 MED ORDER — METOPROLOL TARTRATE 1 MG/ML IV SOLN: 2.0000 mg | INTRAVENOUS | Status: DC | PRN

## 2013-03-20 MED ORDER — CLOPIDOGREL BISULFATE 75 MG PO TABS
75.0000 mg | ORAL_TABLET | Freq: Once | ORAL | Status: AC
  Administered 2013-03-20: 75 mg via ORAL

## 2013-03-20 MED ORDER — NOREPINEPHRINE BITARTRATE 1 MG/ML IJ SOLN
INTRAMUSCULAR | Status: AC
  Filled 2013-03-20: qty 4

## 2013-03-20 MED ORDER — INSULIN LISPRO 100 UNIT/ML (KWIKPEN): 5.0000 [IU] | PEN_INJECTOR | Freq: Two times a day (BID) | SUBCUTANEOUS | Status: DC

## 2013-03-20 MED ORDER — HYDRALAZINE HCL 50 MG PO TABS
50.0000 mg | ORAL_TABLET | Freq: Three times a day (TID) | ORAL | Status: DC | PRN
  Filled 2013-03-20: qty 1

## 2013-03-20 MED ORDER — RANOLAZINE ER 500 MG PO TB12
1000.0000 mg | ORAL_TABLET | Freq: Two times a day (BID) | ORAL | Status: DC
  Administered 2013-03-21: 1000 mg via ORAL
  Filled 2013-03-20 (×3): qty 2

## 2013-03-20 MED ORDER — VITAMIN B-12 1000 MCG PO TABS
1000.0000 ug | ORAL_TABLET | Freq: Every day | ORAL | Status: DC
  Filled 2013-03-20 (×2): qty 1

## 2013-03-20 MED ORDER — B COMPLEX-C PO TABS
1.0000 | ORAL_TABLET | Freq: Every day | ORAL | Status: DC
  Filled 2013-03-20 (×2): qty 1

## 2013-03-20 MED ORDER — ONDANSETRON HCL 4 MG/2ML IJ SOLN
INTRAMUSCULAR | Status: AC
  Filled 2013-03-20: qty 2

## 2013-03-20 MED ORDER — ONDANSETRON HCL 4 MG PO TABS: 4.0000 mg | ORAL_TABLET | Freq: Two times a day (BID) | ORAL | Status: DC | PRN

## 2013-03-20 MED ORDER — LABETALOL HCL 5 MG/ML IV SOLN: 10.0000 mg | INTRAVENOUS | Status: DC | PRN

## 2013-03-20 MED ORDER — INSULIN GLARGINE 100 UNIT/ML ~~LOC~~ SOLN
10.0000 [IU] | Freq: Every evening | SUBCUTANEOUS | Status: DC | PRN
  Filled 2013-03-20: qty 0.1

## 2013-03-20 MED ORDER — HEPARIN (PORCINE) IN NACL 2-0.9 UNIT/ML-% IJ SOLN
INTRAMUSCULAR | Status: AC
  Filled 2013-03-20: qty 1000

## 2013-03-20 MED ORDER — FUROSEMIDE 40 MG PO TABS
40.0000 mg | ORAL_TABLET | Freq: Every day | ORAL | Status: DC
  Administered 2013-03-21: 40 mg via ORAL
  Filled 2013-03-20 (×2): qty 1

## 2013-03-20 MED ORDER — PANTOPRAZOLE SODIUM 40 MG PO TBEC
40.0000 mg | DELAYED_RELEASE_TABLET | Freq: Every day | ORAL | Status: DC
Start: 2013-03-21 — End: 2013-03-21
  Administered 2013-03-21: 40 mg via ORAL
  Filled 2013-03-20: qty 1

## 2013-03-20 MED ORDER — ALPRAZOLAM 0.5 MG PO TABS
0.5000 mg | ORAL_TABLET | Freq: Three times a day (TID) | ORAL | Status: DC | PRN
  Administered 2013-03-21: 0.5 mg via ORAL
  Filled 2013-03-20: qty 1

## 2013-03-20 MED ORDER — FLUOXETINE HCL 20 MG PO CAPS
20.0000 mg | ORAL_CAPSULE | Freq: Every day | ORAL | Status: DC
  Administered 2013-03-21: 20 mg via ORAL
  Filled 2013-03-20 (×2): qty 1

## 2013-03-20 NOTE — Op Note (Signed)
Patient name: [name] [name] [name]: [id number] DOB: 11/11/1945 Sex: female  03/20/2013 Pre-operative Diagnosis: Symptomatic left carotid stenosis Post-operative diagnosis:  Same Surgeon:  Eldridge Abrahams, Quay Burow, MD Procedure Performed:  1.  ultrasound-guided access, right femoral artery  2.  aortic arch angiogram  3.  left carotid stem with distal embolic protection   Indications:  The patient is having recurrent episodes of expressive aphasia.  Her ultrasound findings have shown a progression in the stenosis of her left carotid disease.  She is felt to be high risk for carotid endarterectomy given her coronary disease.  Therefore decision has been made to proceed with carotid stenting.  The risks and benefits of the procedure were discussed with the patient.  Procedure:  The patient was identified in the holding area and taken to room 8.  The patient was then placed supine on the table and prepped and draped in the usual sterile fashion.  A time out was called.  Ultrasound was used to evaluate the right common femoral artery.  It was patent .  A digital ultrasound image was acquired.  A micropuncture needle was used to access the right common femoral artery under ultrasound guidance.  An 018 wire was advanced without resistance and a micropuncture sheath was placed.  The 018 wire was removed and a benson wire was placed.  The micropuncture sheath was exchanged for a 5 french sheath.  A pigtail catheter was advanced to the aortic arch and an aortic arch angiogram was performed.  Next, using a JR 4 catheter, the left carotid artery was selected and a carotid angiogram was performed with intracranial imaging.  Findings:   Aortic arch:  A type I aortic arch is identified.  No significant stenosis is identified within the innominate artery, proximal right subclavian proximal right carotid artery.  There does appear to be significant right external carotid stenosis.  The left common  carotid artery is widely patent.  The left subclavian proximally is widely patent  Left carotid:  Approximately 80% stenosis is identified within the proximal left internal carotid artery.  The distal internal carotid artery is widely patent but tortuous.   Intervention:  After the above images were acquired, the decision was made to proceed with intervention.  A 90 cm 6 French sheath was then inserted into the descending thoracic aorta.  Using a JR 4 catheter, the left internal carotid artery was selected.  An Amplatz superstiff wire was then inserted.  The catheter was advanced into the internal carotid artery followed by the sheath.  During this time Angiomax bolus and continuous infusion were initiated.  The ACT was confirmed to be greater than 300.  Once the sheath was confirmed to be in adequate location, a large NAV-6 was prepared and loaded through the sheath.  Initially the tortuosity of the distal internal carotid artery caused the sheath to come out of the artery.  We therefore had to recannulate the left common carotid artery and over the Amplatz superstiff wire and JR 4 catheter, advanced the sheath into the distal left common carotid artery.  This time when the filter was inserted the wire was advanced through the lesion and then into the distal internal carotid artery around the bend.  Then, the filter followed relatively easily.  The filter was then deployed.  The lesion was then predilated with a 3 mm balloon.  The patient received 0.5 mg of atropine for this.  Next, a 9 x 7 x 30 XACT  stent was prepared on the back table and then loaded on the wire.  It was positioned in the internal carotid artery and common carotid artery.  The stent was then deployed.  It was molded to confirmation with a 5 mm balloon.  Completion angiography revealed widely patent internal carotid artery.  There was some residual spasm within the distal internal carotid artery with a filter was located.  The filter was then  retrieved without difficulty.  Intracranial imaging was then performed.  The patient remained neurologically intact.  All intracranial imaging will be separately dictated by the neuroradiology.  Next, the sheath was removed and exchanged out for a short 6 [place] sheath.  The patient taken the recovery neurologically intact.  There were no immediate complications  Impression:  #1  successful stenting of a 80% left internal carotid artery stenosis using a 9 x 7 x 30 XACT stent with distal embolic protection    V. Annamarie Major, M.D. [facility] Office: [phone] Pager:  [phone]

## 2013-03-20 NOTE — Interval H&P Note (Signed)
History and Physical Interval Note:  03/20/2013 7:23 AM  [name] [name]  has presented today for surgery, with the diagnosis of Carotid stenosis  The various methods of treatment have been discussed with the patient and family. After consideration of risks, benefits and other options for treatment, the patient has consented to  Procedure(s): CAROTID STENT INSERTION (N/A) as a surgical intervention .  The patient's history has been reviewed, patient examined, no change in status, stable for surgery.  I have reviewed the patient's chart and labs.  Questions were answered to the patient's satisfaction.     Sharion Grieves IV, V. WELLS

## 2013-03-20 NOTE — H&P (View-Only) (Signed)
Patient name: [name] [name] [name]: [id number] DOB: August 24, 1945 Sex: female     Chief Complaint  Patient presents with  . Re-evaluation    discuss L carotid stenting    HISTORY OF PRESENT ILLNESS: This is a patient of Dr. Deitra Mayo and Loralie Champagne,, who I have been asked to evaluate for carotid stenting.  Her history is as below, from Dr. Scot Dock is most recent note:   [name] [name] is a 68 y.o. female with a complicated history. I last saw her on 07/12/2012. At that time she had a 60-79% left carotid stenosis. There was no stenosis on the right. I had been considering a left carotid endarterectomy in the past after she had an episode that sounded like expressive aphasia. However she underwent a cerebral arteriogram in February of 2013 which showed that the stenosis was only approximately 50%. After discussing the case with her neurologist he felt that she was likely having migraine headaches and that the left carotid stenosis was not symptomatic. She has been on aspirin and Plavix.  Prior to her last visit it was noted that she had a right posterior cerebral artery ischemic stroke associated with vision loss in both eyes. An MRI during that workup suggested significant vertebral artery occlusive disease. I recommended arch aortogram and cerebral arteriography to focus on the vertebral basilar system as the potential cause of her stroke and dizziness at that time. Given that she might be a candidate for vertebral artery angioplasty and stenting we were going to arrange for Dr. Estanislado Pandy to perform this.  The patient underwent a cerebral arteriogram on 07/25/2012. She had a 70% stenosis of the right vertebral artery at its origin although there was unimpeded flow more distally to the cranial skull base and intracranially. She was also noted to have a 55-60% stenosis of the left internal carotid artery and a 3-5 mm saccular aneurysm of the left PCOM. At that time, Dr. Estanislado Pandy  recommended continuing her antiplatelet therapy with aggressive conservative management of her risk factors. If the patient continued to have symptoms then consideration could be given to an endovascular approach to her right vertebral artery stenosis. A 3 month follow up visit was arranged.  Since I saw her last, she continues to have intermittent episodes of expressive aphasia. This sometimes occurs more than once in one day, and at other times she can go several weeks without having any symptoms. He also has had some persistent dizziness which is not new. She denies focal weakness or paresthesias.  She has been seeing Dr. [name].  He would prefer carotid stenting as the patient will be relatively high risk for surgery given her frequent angina with coronary disease not amenable to intervention as well as active diastolic heart failure.  Past Medical History  Diagnosis Date  . Migraine   . Osteoarthritis   . Vomiting     persistent  . Asthma   . Ventral hernia     hx of  . Diabetes mellitus   . GERD (gastroesophageal reflux disease)   . Obesity 01-2010  . Hyperlipidemia   . Hypertension   . Irregular heart beat   . Myocardial infarction 2011    04/1999, 02/2000, 01/2005  . Coronary artery disease   . Heart murmur   . Shortness of breath   . Obstructive sleep apnea     no cpap- sleep study 5 yrs ago  . Anxiety   . PONV (postoperative nausea and vomiting)   .  Peripheral vascular disease     ? numbness, tingling arms and legs  . Anemia     hx  . H/O hiatal hernia   . Neuromuscular disorder     ?  Marland Kitchen Dysrhythmia   . Mental disorder     hx of confusion  . Chronic kidney disease     frequency  . Stroke 03/22/12    right side brain  . CHF (congestive heart failure)     Past Surgical History  Procedure Laterality Date  . Tumor removal  1970's    ovarian  . Leg surgery  1970's    right, rods and pins  . Hernia repair    . Angioplasty      multiple times  . Cholecystectomy     . Gastric stapling  1984  . Total abdominal hysterectomy  1970's  . Ankle fracture surgery  70's    lft   . Cardiac catheterization  01,02,05,06,07,08,11    stents 02  . Fracture surgery    . Appendectomy    . Coronary artery bypass graft  12/11  . Coronary angioplasty      stents  . Esophagogastroduodenoscopy  08/03/2011    Procedure: ESOPHAGOGASTRODUODENOSCOPY (EGD);  Surgeon: Shann Medal, MD;  Location: [facility];  Service: General;  Laterality: N/A;  . Gall stone removal  05/2003  . Root canal  10/2000  . Left heart cath  10/10/12    Dr [name].    History   Social History  . Marital Status: Married    Spouse Name: [name]     Number of Children: 1  . Years of Education: 12+   Occupational History  . retired    Social History Main Topics  . Smoking status: Never Smoker   . Smokeless tobacco: Never Used  . Alcohol Use: No  . Drug Use: No  . Sexual Activity: Not on file     Comment: hysterectomy   Other Topics Concern  . Not on file   Social History Narrative   Patient lives at home with husband [name].    Patient has 1 child.    Patient is not currently working.    Patient has 2 years of college.     Family History  Problem Relation Age of Onset  . Heart disease Father     Heart Disease before age 62  . Diabetes Father   . Hyperlipidemia Father   . Hypertension Father   . Heart attack Father   . Deep vein thrombosis Father   . Diabetes      family hx of paternal grandmother, aunt and grandfather- uncles  . Hypertension Mother   . Cancer Sister     Allergies as of 02/19/2013 - Review Complete 02/19/2013  Allergen Reaction Noted  . Amoxicillin Shortness Of Breath and Rash 10/30/2007  . Erythromycin Other (See Comments)   . Penicillins Shortness Of Breath and Rash   . Metformin Nausea Only and Other (See Comments) 02/23/2010  . Metformin and related  12/07/2011  . Tape Other (See Comments) 07/20/2012  . Isosorbide mononitrate [isosorbide]   11/09/2012    Current Outpatient Prescriptions on File Prior to Visit  Medication Sig Dispense Refill  . albuterol (PROVENTIL) (2.5 MG/3ML) 0.083% nebulizer solution Take 2.5 mg by nebulization every 6 (six) hours as needed. For shortness of breath.      Marland Kitchen albuterol (PROVENTIL,VENTOLIN) 90 MCG/ACT inhaler Inhale 2 puffs into the lungs every 6 (six) hours as needed. For shortness of  breath.      . ALPRAZolam (XANAX) 0.5 MG tablet Take 0.5 mg by mouth See admin instructions. For anxiety - Patient takes 1 in the morning and 1 in the evening daily. If needed she may take one in the afternoon.      Marland Kitchen amLODipine (NORVASC) 10 MG tablet Take 1 tablet (10 mg total) by mouth daily.  30 tablet  6  . aspirin EC 81 MG tablet Take 81 mg by mouth daily.      . B Complex-C (B-COMPLEX WITH VITAMIN C) tablet Take 1 tablet by mouth daily.      . Calcium 1500 MG tablet Take 1,500 mg by mouth daily.       . Cholecalciferol (VITAMIN D) 2000 UNITS tablet Take 2,000 Units by mouth daily.      . cloNIDine (CATAPRES) 0.2 MG tablet Take 0.2 mg by mouth 2 (two) times daily.      . clopidogrel (PLAVIX) 75 MG tablet Take 1 tablet (75 mg total) by mouth daily.  30 tablet  6  . FLUoxetine (PROZAC) 20 MG capsule Take 20 mg by mouth daily.      . Fluticasone-Salmeterol (ADVAIR DISKUS) 250-50 MCG/DOSE AEPB Inhale 1 puff into the lungs every morning. For asthma symptoms      . furosemide (LASIX) 40 MG tablet 1 and 1/2 tablets (total 60mg ) two times a day for 3 days, then decrease to 1 tablet (total 40mg ) two times a day.  90 tablet  3  . gabapentin (NEURONTIN) 600 MG tablet Take 600 mg by mouth at bedtime.       Marland Kitchen HUMALOG KWIKPEN 100 UNIT/ML SOPN 14 Units 2 (two) times daily with a meal.       . hydrALAZINE (APRESOLINE) 50 MG tablet Take 50 mg by mouth 3 (three) times daily as needed (for high blood pressure). Does not take dose if BP low      . HYDROcodone-acetaminophen (NORCO/VICODIN) 5-325 MG per tablet Take 1 tablet by mouth  every evening.       . insulin glargine (LANTUS) 100 UNIT/ML injection Inject 44 Units into the skin every morning. 44 units in the morning      . ipratropium-albuterol (DUONEB) 0.5-2.5 (3) MG/3ML SOLN Take 3 mLs by nebulization every 4 (four) hours as needed.      Marland Kitchen losartan (COZAAR) 100 MG tablet Take 100 mg by mouth 2 (two) times daily.       . Magnesium Oxide 250 MG TABS Take 1 tablet by mouth 2 (two) times daily.      . metoprolol (LOPRESSOR) 50 MG tablet 1 and 1/2 tablets (total 75mg ) two times a day  90 tablet  3  . Multiple Vitamin (MULTIVITAMIN) tablet Take 1 tablet by mouth daily.        . niacin (NIASPAN) 1000 MG CR tablet Take 1,000 mg by mouth at bedtime.      . nitroGLYCERIN (NITROSTAT) 0.3 MG SL tablet Place 0.3 mg under the tongue every 5 (five) minutes x 3 doses as needed for chest pain. Last taken on 11/02/12 (took 2 doses and chest pain resolved)      . omeprazole (PRILOSEC) 20 MG capsule Take 20 mg by mouth daily with breakfast.       . ondansetron (ZOFRAN) 4 MG tablet Take 4 mg by mouth 2 (two) times daily as needed. For nausea       . ONE TOUCH ULTRA TEST test strip       .  OVER THE COUNTER MEDICATION Take 250 mg by mouth daily. Magnesium tablets      . ranolazine (RANEXA) 1000 MG SR tablet Take 1 tablet (1,000 mg total) by mouth 2 (two) times daily.  60 tablet  6  . rosuvastatin (CRESTOR) 40 MG tablet Take 40 mg by mouth daily.       Marland Kitchen topiramate (TOPAMAX) 50 MG tablet Take 1 tablet (50 mg total) by mouth 2 (two) times daily.  60 tablet  6  . triamterene-hydrochlorothiazide (DYAZIDE) 37.5-25 MG per capsule Take 1 capsule by mouth every morning.       . vitamin B-12 (CYANOCOBALAMIN) 1000 MCG tablet Take 1,000 mcg by mouth daily.      Marland Kitchen zolpidem (AMBIEN) 10 MG tablet Take 10 mg by mouth at bedtime as needed for sleep.        No current facility-administered medications on file prior to visit.     REVIEW OF SYSTEMS: No change in review of symptoms from Dr. Nicole Cella  note  PHYSICAL EXAMINATION:   Vital signs are BP 118/49  Pulse 52  Ht 5\' 4"  (1.626 m)  Wt 275 lb (124.739 kg)  BMI 47.18 kg/m2  SpO2 95% General: The patient appears their stated age. HEENT:  No gross abnormalities Pulmonary:  Non labored breathing Musculoskeletal: There are no major deformities. Neurologic: No focal weakness or paresthesias are detected, Skin: There are no ulcer or rashes noted. Psychiatric: The patient has normal affect. Cardiovascular: There is a regular rate and rhythm without significant murmur appreciated.  I did not hear any carotid bruits.  She has palpable pedal pulses   Diagnostic Studies I have reviewed her angiogram which shows a bovine aortic arch.  I have also reviewed her selected carotid imaging studies.  Assessment:  symptomatic left carotid stenosis Plan: I have reviewed her treatment options which include stenting vs. endarterectomy.  Because of her coronary disease, Dr. [name], her cardiologist has recommended stenting if possible.  We discussed the risks and benefits of the operation which include but are not limited to bleeding from the cannulation site and a stroke.  The patient wishes to have this done as she continues to have daily episodes of expressive aphasia.  These have been going on for almost a year.  She is very concerned about having a stroke.  She does have plans to take care of her mother within the next 3 weeks if she transitions from a nursing facility to home.  Therefore she would like to have this procedure schedule for 3 weeks from now.  I told her to continue her aspirin and Plavix.  Eldridge Abrahams, M.D. [facility] Office: [phone] Pager:  [phone]

## 2013-03-20 NOTE — Progress Notes (Signed)
Recovery RN & Pt state SB prior to today's event; called DR on call for SB in 90s occassionally, low BP in the 90s. new order to start dopamine; will continue to monitor.

## 2013-03-20 NOTE — Progress Notes (Addendum)
Pt arrived from cath lab, bradycardic, but otherwise VSS. Groin site level 0, neuro intact, will continue to monitor.

## 2013-03-20 NOTE — Progress Notes (Signed)
Utilization review completed.  

## 2013-03-21 ENCOUNTER — Other Ambulatory Visit: Payer: Self-pay | Admitting: *Deleted

## 2013-03-21 ENCOUNTER — Telehealth: Payer: Self-pay | Admitting: Surgery

## 2013-03-21 DIAGNOSIS — I6529 Occlusion and stenosis of unspecified carotid artery: Secondary | ICD-10-CM

## 2013-03-21 DIAGNOSIS — Z48812 Encounter for surgical aftercare following surgery on the circulatory system: Secondary | ICD-10-CM

## 2013-03-21 LAB — CBC
HCT: 36.9 % (ref 36.0–46.0)
Hemoglobin: 12.5 g/dL (ref 12.0–15.0)
MCH: 32.6 pg (ref 26.0–34.0)
MCHC: 33.9 g/dL (ref 30.0–36.0)
MCV: 96.3 fL (ref 78.0–100.0)
Platelets: 260 10*3/uL (ref 150–400)
RBC: 3.83 MIL/uL — ABNORMAL LOW (ref 3.87–5.11)
RDW: 13 % (ref 11.5–15.5)
WBC: 14.8 10*3/uL — ABNORMAL HIGH (ref 4.0–10.5)

## 2013-03-21 LAB — BASIC METABOLIC PANEL
BUN: 33 mg/dL — ABNORMAL HIGH (ref 6–23)
CO2: 24 mEq/L (ref 19–32)
Calcium: 8.8 mg/dL (ref 8.4–10.5)
Chloride: 105 mEq/L (ref 96–112)
Creatinine, Ser: 1.19 mg/dL — ABNORMAL HIGH (ref 0.50–1.10)
GFR calc Af Amer: 54 mL/min — ABNORMAL LOW (ref >90)
GFR calc non Af Amer: 47 mL/min — ABNORMAL LOW (ref >90)
Glucose, Bld: 207 mg/dL — ABNORMAL HIGH (ref 70–99)
Potassium: 4.2 mEq/L (ref 3.7–5.3)
Sodium: 139 mEq/L (ref 137–147)

## 2013-03-21 LAB — GLUCOSE, CAPILLARY
Glucose-Capillary: 164 mg/dL — ABNORMAL HIGH (ref 70–99)
Glucose-Capillary: 172 mg/dL — ABNORMAL HIGH (ref 70–99)
Glucose-Capillary: 202 mg/dL — ABNORMAL HIGH (ref 70–99)

## 2013-03-21 LAB — TROPONIN I: Troponin I: <0.3 ng/mL

## 2013-03-21 MED ORDER — ONDANSETRON HCL 4 MG PO TABS: 4.0000 mg | ORAL_TABLET | Freq: Two times a day (BID) | ORAL | Status: DC | PRN

## 2013-03-21 MED ORDER — PROMETHAZINE HCL 25 MG/ML IJ SOLN
12.5000 mg | Freq: Four times a day (QID) | INTRAMUSCULAR | Status: DC | PRN
  Administered 2013-03-21: 12.5 mg via INTRAVENOUS
  Filled 2013-03-21: qty 1

## 2013-03-21 MED ORDER — INSULIN ASPART 100 UNIT/ML ~~LOC~~ SOLN
5.0000 [IU] | Freq: Once | SUBCUTANEOUS | Status: AC
  Administered 2013-03-21: 5 [IU] via SUBCUTANEOUS

## 2013-03-21 MED ORDER — METOCLOPRAMIDE HCL 5 MG/ML IJ SOLN
10.0000 mg | Freq: Three times a day (TID) | INTRAMUSCULAR | Status: DC
  Filled 2013-03-21 (×3): qty 2

## 2013-03-21 MED ORDER — RANITIDINE HCL 150 MG PO TABS: 150.0000 mg | ORAL_TABLET | Freq: Two times a day (BID) | ORAL | Status: DC

## 2013-03-21 MED ORDER — ONDANSETRON HCL 4 MG PO TABS: 4.0000 mg | ORAL_TABLET | Freq: Three times a day (TID) | ORAL | Status: DC | PRN

## 2013-03-21 NOTE — Discharge Summary (Signed)
[facility] Discharge Summary  [name] [name] 1945/02/13 68 y.o. female  [id number]  Admission Date: 03/20/2013  Discharge Date: 03/21/13  Physician: [name], MD  Admission Diagnosis: Carotid stenosis   HPI:   This is a 68 y.o. female with a complicated history. I last saw her on 07/12/2012. At that time she had a 60-79% left carotid stenosis. There was no stenosis on the right. I had been considering a left carotid endarterectomy in the past after she had an episode that sounded like expressive aphasia. However she underwent a cerebral arteriogram in February of 2013 which showed that the stenosis was only approximately 50%. After discussing the case with her neurologist he felt that she was likely having migraine headaches and that the left carotid stenosis was not symptomatic. She has been on aspirin and Plavix.  Prior to her last visit it was noted that she had a right posterior cerebral artery ischemic stroke associated with vision loss in both eyes. An MRI during that workup suggested significant vertebral artery occlusive disease. I recommended arch aortogram and cerebral arteriography to focus on the vertebral basilar system as the potential cause of her stroke and dizziness at that time. Given that she might be a candidate for vertebral artery angioplasty and stenting we were going to arrange for Dr. Estanislado Pandy to perform this.  The patient underwent a cerebral arteriogram on 07/25/2012. She had a 70% stenosis of the right vertebral artery at its origin although there was unimpeded flow more distally to the cranial skull base and intracranially. She was also noted to have a 55-60% stenosis of the left internal carotid artery and a 3-5 mm saccular aneurysm of the left PCOM. At that time, Dr. Estanislado Pandy recommended continuing her antiplatelet therapy with aggressive conservative management of her risk factors. If the patient continued to have symptoms then  consideration could be given to an endovascular approach to her right vertebral artery stenosis. A 3 month follow up visit was arranged.  Since I saw her last, she continues to have intermittent episodes of expressive aphasia. This sometimes occurs more than once in one day, and at other times she can go several weeks without having any symptoms. He also has had some persistent dizziness which is not new. She denies focal weakness or paresthesias.  She has been seeing Dr. [name]. He would prefer carotid stenting as the patient will be relatively high risk for surgery given her frequent angina with coronary disease not amenable to intervention as well as active diastolic heart failure.   Hospital Course:  The patient was admitted to the hospital and taken to the operating room on 03/20/2013 and underwent 1. ultrasound-guided access, right femoral artery  2. aortic arch angiogram  3. left carotid stem with distal embolic protection   The pt tolerated the procedure well and was transported to the PACU in good condition.   By POD 1, the pt neuro status is in tact.  She was having some nausea that was persistent.  She did have negative cardiac enzymes.  Her nausea did resolve and was discharged later on POD 1.  She is on a combination of Protonix and Plavix.  Since this combination decreases the effects of Plavix, she will be switched to Zantac 150 mg bid.  The remainder of the hospital course consisted of increasing mobilization and increasing intake of solids without difficulty.    Recent Labs  03/20/13 0613 03/21/13 0240  NA 141 139  K 3.9 4.2  CL 102 105  CO2  --  24  GLUCOSE 199* 207*  BUN 30* 33*  CALCIUM  --  8.8    Recent Labs  03/20/13 0613 03/21/13 0240  WBC  --  14.8*  HGB 14.6 12.5  HCT 43.0 36.9  PLT  --  260   No results found for this basename: INR,  in the last 72 hours  Discharge Instructions:   The patient is discharged to home with extensive instructions on  wound care and progressive ambulation.  They are instructed not to drive or perform any heavy lifting until returning to see the physician in his office.  Discharge Orders   Future Appointments Provider Department Dept Phone   04/09/2013 2:30 PM Casandra Doffing, MD [place] [phone]   04/23/2013 9:30 AM [facility] [place] [phone]   04/23/2013 10:45 AM [name], MD [facility] [phone]   05/10/2013 8:20 AM [facility] [employer] [facility] [phone]   06/07/2013 1:45 PM Larey Dresser, MD [place] Office [phone]   07/11/2013 10:30 AM Dennie Bible, NP [facility] [phone]   Future Orders Complete By Expires   Call MD for:  redness, tenderness, or signs of infection (pain, swelling, bleeding, redness, odor or green/yellow discharge around incision site)  As directed    Call MD for:  severe or increased pain, loss or decreased feeling  in affected limb(s)  As directed    Call MD for:  temperature >100.5  As directed    CAROTID Sugery: Call MD for difficulty swallowing or speaking; weakness in arms or legs that is a new symtom; severe headache.  If you have increased swelling in the neck and/or  are having difficulty breathing, CALL 911  As directed    Discharge wound care:  As directed    Comments:     Shower daily with soap and water starting 03/22/13   Driving Restrictions  As directed    Comments:     No driving for 2 weeks   Lifting restrictions  As directed    Comments:     No lifting for 2 weeks   Resume previous diet  As directed       Discharge Diagnosis:  Carotid stenosis  Secondary Diagnosis: Patient Active Problem List   Diagnosis Date Noted  . Carotid artery stenosis, symptomatic 03/20/2013  . Chronic diastolic CHF (congestive heart failure) 09/23/2012  . Cerebrovascular disease, unspecified 07/10/2012  . Cerebral  artery occlusion with cerebral infarction 07/10/2012  . Mitral regurgitation 04/15/2012  . TIA (transient ischemic attack) 04/15/2012  . Occlusion and stenosis of carotid artery without mention of cerebral infarction 08/18/2011  . Bariatric surgery status 06/17/2011  . Speech abnormality 03/22/2011  . Dyspnea 02/24/2011  . PAPILLARY MUSCLE DYSFUNCTION, NON-RHEUMATIC 10/09/2008  . UNSPECIFIED VITAMIN D DEFICIENCY 10/24/2007  . MYOCARDIAL INFARCTION, HX OF 10/24/2007  . PERSISTENT VOMITING 10/24/2007  . OSTEOARTHRITIS 10/24/2007  . MIGRAINES, HX OF 10/24/2007  . DM 01/16/2007  . HYPERLIPIDEMIA 01/16/2007  . Obesity-post failed open gastroplasty 1984  01/16/2007  . OBSTRUCTIVE SLEEP APNEA 01/16/2007  . HYPERTENSION 01/16/2007  . CORONARY ARTERY DISEASE 01/16/2007  . ASTHMA 01/16/2007  . GERD 01/16/2007  . VENTRAL HERNIA 01/16/2007   Past Medical History  Diagnosis Date  . Migraine   . Osteoarthritis   . Vomiting     persistent  . Asthma   . Ventral hernia  hx of  . Diabetes mellitus   . GERD (gastroesophageal reflux disease)   . Obesity 01-2010  . Hyperlipidemia   . Hypertension   . Irregular heart beat   . Myocardial infarction 2011    04/1999, 02/2000, 01/2005  . Coronary artery disease   . Heart murmur   . Shortness of breath   . Obstructive sleep apnea     no cpap- sleep study 5 yrs ago  . Anxiety   . PONV (postoperative nausea and vomiting)   . Peripheral vascular disease     ? numbness, tingling arms and legs  . Anemia     hx  . H/O hiatal hernia   . Neuromuscular disorder     ?  Marland Kitchen Dysrhythmia   . Mental disorder     hx of confusion  . Chronic kidney disease     frequency  . Stroke 03/22/12    right side brain  . CHF (congestive heart failure)       Medication List    STOP taking these medications       omeprazole 20 MG capsule  Commonly known as:  PRILOSEC      TAKE these medications       ADVAIR DISKUS 250-50 MCG/DOSE Aepb  Generic  drug:  Fluticasone-Salmeterol  Inhale 1 puff into the lungs every morning. For asthma symptoms     albuterol (2.5 MG/3ML) 0.083% nebulizer solution  Commonly known as:  PROVENTIL  Take 2.5 mg by nebulization every 6 (six) hours as needed for shortness of breath (when asthma is active). If severe asthma , use q4h around the clock x10 days.  For shortness of breath.     albuterol 90 MCG/ACT inhaler  Commonly known as:  PROVENTIL,VENTOLIN  Inhale 2 puffs into the lungs every 6 (six) hours as needed. For shortness of breath.     ALPRAZolam 0.5 MG tablet  Commonly known as:  XANAX  Take 0.5 mg by mouth 3 (three) times daily as needed. For anxiety - Patient takes 1 in the morning and 1 in the evening daily. If needed she may take one in the afternoon.     AMBIEN 10 MG tablet  Generic drug:  zolpidem  Take 10 mg by mouth at bedtime as needed for sleep.     amLODipine 10 MG tablet  Commonly known as:  NORVASC  Take 1 tablet (10 mg total) by mouth daily.     aspirin EC 81 MG tablet  Take 81 mg by mouth daily.     B-complex with vitamin C tablet  Take 1 tablet by mouth daily.     calcium carbonate 200 MG capsule  Take 1,000 mg by mouth daily with breakfast.     cholestyramine 4 GM/DOSE powder  Commonly known as:  QUESTRAN  2 scoops two times a day (mixed in juice or light kool-aid).     cloNIDine 0.2 MG tablet  Commonly known as:  CATAPRES  Take 0.2 mg by mouth 2 (two) times daily.     clopidogrel 75 MG tablet  Commonly known as:  PLAVIX  Take 1 tablet (75 mg total) by mouth daily.     ezetimibe 10 MG tablet  Commonly known as:  ZETIA  Take 1 tablet (10 mg total) by mouth daily.     FLUoxetine 20 MG capsule  Commonly known as:  PROZAC  Take 20 mg by mouth daily.     furosemide 40 MG tablet  Commonly known as:  LASIX  Take 40 mg by mouth daily.     gabapentin 600 MG tablet  Commonly known as:  NEURONTIN  Take 600 mg by mouth at bedtime.     HUMALOG KWIKPEN 100 UNIT/ML  KiwkPen  Generic drug:  insulin lispro  Inject 5 Units into the skin 2 (two) times daily with a meal.     hydrALAZINE 50 MG tablet  Commonly known as:  APRESOLINE  Take 50 mg by mouth 3 (three) times daily as needed (for high blood pressure). Does not take dose if BP low     HYDROcodone-acetaminophen 5-325 MG per tablet  Commonly known as:  NORCO/VICODIN  Take 1 tablet by mouth every evening.     insulin glargine 100 UNIT/ML injection  Commonly known as:  LANTUS  Inject 44 Units into the skin every morning.     insulin glargine 100 UNIT/ML injection  Commonly known as:  LANTUS  Inject 4-16 Units into the skin at bedtime as needed (For blood sugar = or >165).     loperamide 2 MG capsule  Commonly known as:  IMODIUM  Take 2 mg by mouth as needed for diarrhea or loose stools.     losartan 100 MG tablet  Commonly known as:  COZAAR  Take 100 mg by mouth 2 (two) times daily.     Magnesium Oxide 250 MG Tabs  Take 1 tablet by mouth 2 (two) times daily.     metoprolol 50 MG tablet  Commonly known as:  LOPRESSOR  25 mg 2 (two) times daily.     multivitamin tablet  Take 1 tablet by mouth daily.     nitroGLYCERIN 0.3 MG SL tablet  Commonly known as:  NITROSTAT  Place 0.3 mg under the tongue every 5 (five) minutes x 3 doses as needed for chest pain. Last taken on 11/02/12 (took 2 doses and chest pain resolved)     ondansetron 4 MG tablet  Commonly known as:  ZOFRAN  - Take 4 mg by mouth 2 (two) times daily as needed. For nausea  -      ranitidine 150 MG tablet  Commonly known as:  ZANTAC  Take 1 tablet (150 mg total) by mouth 2 (two) times daily.     ranolazine 1000 MG SR tablet  Commonly known as:  RANEXA  Take 1 tablet (1,000 mg total) by mouth 2 (two) times daily.     rosuvastatin 40 MG tablet  Commonly known as:  CRESTOR  Take 40 mg by mouth daily.     topiramate 50 MG tablet  Commonly known as:  TOPAMAX  Take 1 tablet (50 mg total) by mouth 2 (two) times daily.      triamterene-hydrochlorothiazide 37.5-25 MG per capsule  Commonly known as:  DYAZIDE  Take 1 capsule by mouth every morning.     vitamin B-12 1000 MCG tablet  Commonly known as:  CYANOCOBALAMIN  Take 1,000 mcg by mouth daily.     Vitamin D 2000 UNITS tablet  Take 2,000 Units by mouth daily.     zinc gluconate 50 MG tablet  Take 50 mg by mouth daily.        No pain medication given.  Disposition: home  Patient's condition: is Good  Follow up: 1. Dr.  Trula Slade in 4 weeks with carotid duplex   Leontine Locket, PA-C [facility] [phone]  --- For [facility] use --- Instructions: Press F2 to tab through selections.  Delete question if not applicable.  Modified Rankin score at D/C (0-6): 1  IV medication needed for:  1. Hypertension: No 2. Hypotension: Yes  Post-op Complications: No  1. Post-op CVA or TIA: No  If yes: Event classification (right eye, left eye, right cortical, left cortical, verterobasilar, other): n/a  If yes: Timing of event (intra-op, <6 hrs post-op, >=6 hrs post-op, unknown): n/a  2. CN injury: No  If yes: CN n/a injuried   3. Myocardial infarction: No  If yes: Dx by (EKG or clinical, Troponin): n/a  4.  CHF: No  5.  Dysrhythmia (new): No  6. Wound infection: No  7. Reperfusion symptoms: No  8. Return to OR: No  If yes: return to OR for (bleeding, neurologic, other CEA incision, other): n/a  Discharge medications: Statin use:  Yes If No: [ ]  For Medical reasons, [ ]  Non-compliant, [ ]  Not-indicated ASA use:  Yes  If No: [ ]  For Medical reasons, [ ]  Non-compliant, [ ]  Not-indicated Beta blocker use:  Yes If No: [ ]  For Medical reasons, [ ]  Non-compliant, [ ]  Not-indicated ACE-Inhibitor use:  No, she is on an ARB If No: [ ]  For Medical reasons, [ ]  Non-compliant, [ ]  Not-indicated P2Y12 Antagonist use: Yes, [x ] Plavix, [ ]  Plasugrel, [ ]  Ticlopinine, [ ]  Ticagrelor, [ ]  Other, [ ]  No for medical reason, [ ]   Non-compliant, [ ]  Not-indicated Anti-coagulant use:  No, [ ]  Warfarin, [ ]  Rivaroxaban, [ ]  Dabigatran, [ ]  Other, [ ]  No for medical reason, [ ]  Non-compliant, [ ]  Not-indicated

## 2013-03-21 NOTE — Progress Notes (Addendum)
Discussed with pt the effects that the combination of Plavix and Protonix decrease the effects of Plavix.  She is willing to switch to Zantac.  Discussed with Dr. Trula Slade also.  Her nausea is resolved and she will be discharged this afternoon.  Leontine Locket 03/21/2013 3:17 PM

## 2013-03-21 NOTE — Progress Notes (Addendum)
Physician notified: Zoila Shutter, PA  At: 1214  Regarding: BG 202, no insulin coverage with lunch. Pt would take 5 units novolog at home. Reminder about discharge planning. Pt still needs to walk and is having issues with nausea.  Awaiting return response.   Returned Response at: 1215  Order(s): 5 units novolog before this lunch.

## 2013-03-21 NOTE — Progress Notes (Signed)
Discharge instructions given to patient and spouse. Rx given to patient. Instructed to STOP prilosec and start zantac. Questions answered. Instructed to check BP before PM medications; call cardiologist if BP less than 120 sys or HR less than 60 before taking BP and pain meds. Pt inquired about slurred speech and garbled words (syptoms she had prior to hospitalization) RN instructed pt to come to ER or call Dr Trula Slade office. VSS. All belongings sent home with patient. eICU and CCMT notified of discharge. Pt escorted to private vehicle, driven by husband, by Biomedical engineer.

## 2013-03-21 NOTE — Progress Notes (Signed)
Agree with the above.  Nausea improved.  Cardiac enzymes checked this am and are normal.  If feels better today, will d/c home with rx for Zofran.  WElls Cory Kitt

## 2013-03-21 NOTE — Plan of Care (Signed)
Problem: Phase I Progression Outcomes Goal: If Diabetic, blood sugar < 150 Outcome: Completed/Met Date Met:  03/21/13 Progressing with home dose insulin

## 2013-03-21 NOTE — Progress Notes (Addendum)
[facility] Progress Note  03/21/2013 8:29 AM 1 Day Post-Op  Subjective:  Some nausea, but this has improved.  States she has ambulated.  Tm 99.4 now afebrile HR 0000000 99991111 systolic  Dopamine turned off this morning at 6am-pt tolerating well   Filed Vitals:   03/21/13 0722  BP:   Pulse:   Temp: 97.6 F (36.4 C)  Resp:      Physical Exam: Neuro:  In tact Incision:  Right groin is soft Extremities:  Right foot is warm and well perfused with palpable Right DP  CBC    Component Value Date/Time   WBC 14.8* 03/21/2013 0240   RBC 3.83* 03/21/2013 0240   HGB 12.5 03/21/2013 0240   HCT 36.9 03/21/2013 0240   PLT 260 03/21/2013 0240   MCV 96.3 03/21/2013 0240   MCH 32.6 03/21/2013 0240   MCHC 33.9 03/21/2013 0240   RDW 13.0 03/21/2013 0240   LYMPHSABS 2.1 10/04/2012 1334   MONOABS 0.6 10/04/2012 1334   EOSABS 0.1 10/04/2012 1334   BASOSABS 0.0 10/04/2012 1334    BMET    Component Value Date/Time   NA 139 03/21/2013 0240   K 4.2 03/21/2013 0240   CL 105 03/21/2013 0240   CO2 24 03/21/2013 0240   GLUCOSE 207* 03/21/2013 0240   BUN 33* 03/21/2013 0240   CREATININE 1.19* 03/21/2013 0240   CALCIUM 8.8 03/21/2013 0240   GFRNONAA 47* 03/21/2013 0240   GFRAA 54* 03/21/2013 0240     Intake/Output Summary (Last 24 hours) at 03/21/13 0829 Last data filed at 03/21/13 0615  Gross per 24 hour  Intake 269.17 ml  Output    425 ml  Net -155.83 ml      Assessment/Plan:  This is a 68 y.o. female who is s/p 1. ultrasound-guided access, right femoral artery  2. aortic arch angiogram  3. left carotid stem with distal embolic protection  1 Day Post-Op  -pt is doing well this am.  She has had some nausea, but this is resolving and she is looking forward to breakfast -she was requiring dopamine last pm, but this has been discontinued and her pressure is in the 123456 systolic at this time.   HR is in the 60's this am.  Will hold on starting the Metoprolol given she was  bradycardic yesterday.  Will start back Cozaar, Lasix, and triamterene/HCTZ this am.  Hold on hydralazine and clonidine this am to make sure her pressure will tolerate. -continue Plavix for carotid stent -pt neuro exam is in tact -pt has ambulated -pt has voided   Leontine Locket, PA-C [facility] [phone]

## 2013-03-21 NOTE — Telephone Encounter (Addendum)
Message copied by Gena Fray on Wed Mar 21, 2013  4:08 PM ------      Message from: Mena Goes      Created: Tue Mar 20, 2013 10:05 AM      Regarding: Schedule                   ----- Message -----         From: [name], MD         Sent: 03/20/2013   9:21 AM           To: [facility] Charge Pool            03/20/2013:            Surgeon:  Eldridge Abrahams, Quay Burow, MD      Procedure Performed:       1.  ultrasound-guided access, right femoral artery       2.  aortic arch angiogram       3.  left carotid stem with distal embolic protection                  Dr. Gwenlyn Found and I are tried to figure out who is supposed to pill for a carotid stent.  His office is going to let us know.  I will let you know when I hear.  Regardless of his bills for the carotid stent, I charge for the ultrasound-guided access and the aortic arch angiogram.  The patient is to followup with me in one month with a duplex ultrasound ------  03/21/13: lm for pt re appt, dpm

## 2013-03-22 ENCOUNTER — Ambulatory Visit: Payer: Medicare Other | Admitting: Cardiology

## 2013-03-23 ENCOUNTER — Telehealth: Payer: Self-pay

## 2013-03-23 ENCOUNTER — Emergency Department (HOSPITAL_BASED_OUTPATIENT_CLINIC_OR_DEPARTMENT_OTHER)
Admission: EM | Admit: 2013-03-23 | Discharge: 2013-03-23 | Disposition: A | Discharge status: Home / Self Care | Payer: Medicare Other | Attending: Emergency Medicine | Admitting: Emergency Medicine

## 2013-03-23 ENCOUNTER — Encounter (HOSPITAL_BASED_OUTPATIENT_CLINIC_OR_DEPARTMENT_OTHER): Payer: Self-pay | Admitting: Emergency Medicine

## 2013-03-23 DIAGNOSIS — R011 Cardiac murmur, unspecified: Secondary | ICD-10-CM | POA: Insufficient documentation

## 2013-03-23 DIAGNOSIS — Z951 Presence of aortocoronary bypass graft: Secondary | ICD-10-CM | POA: Insufficient documentation

## 2013-03-23 DIAGNOSIS — I509 Heart failure, unspecified: Secondary | ICD-10-CM | POA: Insufficient documentation

## 2013-03-23 DIAGNOSIS — I129 Hypertensive chronic kidney disease with stage 1 through stage 4 chronic kidney disease, or unspecified chronic kidney disease: Secondary | ICD-10-CM | POA: Insufficient documentation

## 2013-03-23 DIAGNOSIS — E119 Type 2 diabetes mellitus without complications: Secondary | ICD-10-CM | POA: Insufficient documentation

## 2013-03-23 DIAGNOSIS — I739 Peripheral vascular disease, unspecified: Secondary | ICD-10-CM | POA: Insufficient documentation

## 2013-03-23 DIAGNOSIS — F411 Generalized anxiety disorder: Secondary | ICD-10-CM | POA: Insufficient documentation

## 2013-03-23 DIAGNOSIS — E669 Obesity, unspecified: Secondary | ICD-10-CM | POA: Insufficient documentation

## 2013-03-23 DIAGNOSIS — E785 Hyperlipidemia, unspecified: Secondary | ICD-10-CM | POA: Insufficient documentation

## 2013-03-23 DIAGNOSIS — Z794 Long term (current) use of insulin: Secondary | ICD-10-CM | POA: Insufficient documentation

## 2013-03-23 DIAGNOSIS — I252 Old myocardial infarction: Secondary | ICD-10-CM | POA: Insufficient documentation

## 2013-03-23 DIAGNOSIS — Z9889 Other specified postprocedural states: Secondary | ICD-10-CM | POA: Insufficient documentation

## 2013-03-23 DIAGNOSIS — M1990 Unspecified osteoarthritis, unspecified site: Secondary | ICD-10-CM | POA: Insufficient documentation

## 2013-03-23 DIAGNOSIS — IMO0002 Reserved for concepts with insufficient information to code with codable children: Secondary | ICD-10-CM | POA: Insufficient documentation

## 2013-03-23 DIAGNOSIS — M542 Cervicalgia: Secondary | ICD-10-CM | POA: Insufficient documentation

## 2013-03-23 DIAGNOSIS — Z79899 Other long term (current) drug therapy: Secondary | ICD-10-CM | POA: Insufficient documentation

## 2013-03-23 DIAGNOSIS — R209 Unspecified disturbances of skin sensation: Secondary | ICD-10-CM | POA: Insufficient documentation

## 2013-03-23 DIAGNOSIS — Z862 Personal history of diseases of the blood and blood-forming organs and certain disorders involving the immune mechanism: Secondary | ICD-10-CM | POA: Insufficient documentation

## 2013-03-23 DIAGNOSIS — N189 Chronic kidney disease, unspecified: Secondary | ICD-10-CM | POA: Insufficient documentation

## 2013-03-23 DIAGNOSIS — Z9861 Coronary angioplasty status: Secondary | ICD-10-CM | POA: Insufficient documentation

## 2013-03-23 DIAGNOSIS — Z7902 Long term (current) use of antithrombotics/antiplatelets: Secondary | ICD-10-CM | POA: Insufficient documentation

## 2013-03-23 DIAGNOSIS — Z880 Allergy status to penicillin: Secondary | ICD-10-CM | POA: Insufficient documentation

## 2013-03-23 DIAGNOSIS — K219 Gastro-esophageal reflux disease without esophagitis: Secondary | ICD-10-CM | POA: Insufficient documentation

## 2013-03-23 DIAGNOSIS — G43909 Migraine, unspecified, not intractable, without status migrainosus: Secondary | ICD-10-CM | POA: Insufficient documentation

## 2013-03-23 DIAGNOSIS — J45909 Unspecified asthma, uncomplicated: Secondary | ICD-10-CM | POA: Insufficient documentation

## 2013-03-23 DIAGNOSIS — Z7982 Long term (current) use of aspirin: Secondary | ICD-10-CM | POA: Insufficient documentation

## 2013-03-23 DIAGNOSIS — I2510 Atherosclerotic heart disease of native coronary artery without angina pectoris: Secondary | ICD-10-CM | POA: Insufficient documentation

## 2013-03-23 DIAGNOSIS — Z8673 Personal history of transient ischemic attack (TIA), and cerebral infarction without residual deficits: Secondary | ICD-10-CM | POA: Insufficient documentation

## 2013-03-23 MED ORDER — OXYCODONE-ACETAMINOPHEN 5-325 MG PO TABS
1.0000 | ORAL_TABLET | Freq: Once | ORAL | Status: AC
  Administered 2013-03-23: 1 via ORAL
  Filled 2013-03-23: qty 1

## 2013-03-23 MED ORDER — OXYCODONE-ACETAMINOPHEN 5-325 MG PO TABS: 1.0000 | ORAL_TABLET | ORAL | Status: DC | PRN

## 2013-03-23 NOTE — Discharge Summary (Signed)
I agree with the above  [name]

## 2013-03-23 NOTE — ED Provider Notes (Signed)
CSN: [id number]     Arrival date & time 03/23/13  2214 History  This chart was scribed for Hoy Morn, MD by Anastasia Pall, ED Scribe. This patient was seen in room MH09/MH09 and the patient's care was started at 11:20 PM.   Chief Complaint  Patient presents with  . Neck Pain   The history is provided by the patient. No language interpreter was used.   HPI Comments: [name] [name] is a 68 y.o. female who presents to the Emergency Department complaining of constant, throbbing, left ear and jaw pain, without swelling, onset yesterday morning.   She reports having left carotid stent placed 02/17 by Dr. Trula Slade at [facility]. She states her right carotid is 40% blocked.  Indication for surgery was recurrent intermittent expressive aphasia. She denies ear/jaw pain while in hospital. She reports trouble sleeping due to her pain. Movement of her neck exacerbates her pain. She reports taking Advil, Tylenol for her pain, with little relief, last taken this morning.  She reports calling her vascular surgeon today and spoke with the nurse, who told her pain was probably not from stent. She has follow up appointment the 03/26/13. She states she was told to go to Urgent Care if her pain persists tomorrow.   She denies weakness, numbness, tingling, visual disturbance, headaches, and any other associated symptoms.   PCP - Marylene Land, MD  Past Medical History  Diagnosis Date  . Migraine   . Osteoarthritis   . Vomiting     persistent  . Asthma   . Ventral hernia     hx of  . Diabetes mellitus   . GERD (gastroesophageal reflux disease)   . Obesity 01-2010  . Hyperlipidemia   . Hypertension   . Irregular heart beat   . Myocardial infarction 2011    04/1999, 02/2000, 01/2005  . Coronary artery disease   . Heart murmur   . Shortness of breath   . Obstructive sleep apnea     no cpap- sleep study 5 yrs ago  . Anxiety   . PONV (postoperative nausea and vomiting)   . Peripheral vascular disease      ? numbness, tingling arms and legs  . Anemia     hx  . H/O hiatal hernia   . Neuromuscular disorder     ?  Marland Kitchen Dysrhythmia   . Mental disorder     hx of confusion  . Chronic kidney disease     frequency  . Stroke 03/22/12    right side brain  . CHF (congestive heart failure)    Past Surgical History  Procedure Laterality Date  . Tumor removal  1970's    ovarian  . Leg surgery  1970's    right, rods and pins  . Hernia repair    . Angioplasty      multiple times  . Cholecystectomy    . Gastric stapling  1984  . Total abdominal hysterectomy  1970's  . Ankle fracture surgery  70's    lft   . Cardiac catheterization  01,02,05,06,07,08,11    stents 02  . Fracture surgery    . Appendectomy    . Coronary artery bypass graft  12/11  . Coronary angioplasty      stents  . Esophagogastroduodenoscopy  08/03/2011    Procedure: ESOPHAGOGASTRODUODENOSCOPY (EGD);  Surgeon: Shann Medal, MD;  Location: [facility];  Service: General;  Laterality: N/A;  . Gall stone removal  05/2003  . Root canal  10/2000  .  Left heart cath  10/10/12    Dr [name].   Family History  Problem Relation Age of Onset  . Heart disease Father     Heart Disease before age 58  . Diabetes Father   . Hyperlipidemia Father   . Hypertension Father   . Heart attack Father   . Deep vein thrombosis Father   . Diabetes      family hx of paternal grandmother, aunt and grandfather- uncles  . Hypertension Mother   . Cancer Sister    History  Substance Use Topics  . Smoking status: Never Smoker   . Smokeless tobacco: Never Used  . Alcohol Use: No   OB History   Grav Para Term Preterm Abortions TAB SAB Ect Mult Living                 Review of Systems A complete 10 system review of systems was obtained and all systems are negative except as noted in the HPI and PMH.   Allergies  Amoxicillin; Erythromycin; Penicillins; Metformin and related; Tape; and Isosorbide mononitrate  Home Medications   Current  Outpatient Rx  Name  Route  Sig  Dispense  Refill  . albuterol (PROVENTIL) (2.5 MG/3ML) 0.083% nebulizer solution   Nebulization   Take 2.5 mg by nebulization every 6 (six) hours as needed for shortness of breath (when asthma is active). If severe asthma , use q4h around the clock x10 days.  For shortness of breath.         Marland Kitchen albuterol (PROVENTIL,VENTOLIN) 90 MCG/ACT inhaler   Inhalation   Inhale 2 puffs into the lungs every 6 (six) hours as needed. For shortness of breath.         . ALPRAZolam (XANAX) 0.5 MG tablet   Oral   Take 0.5 mg by mouth 3 (three) times daily as needed. For anxiety - Patient takes 1 in the morning and 1 in the evening daily. If needed she may take one in the afternoon.         Marland Kitchen amLODipine (NORVASC) 10 MG tablet   Oral   Take 1 tablet (10 mg total) by mouth daily.   30 tablet   6   . aspirin EC 81 MG tablet   Oral   Take 81 mg by mouth daily.         . B Complex-C (B-COMPLEX WITH VITAMIN C) tablet   Oral   Take 1 tablet by mouth daily.         . calcium carbonate 200 MG capsule   Oral   Take 1,000 mg by mouth daily with breakfast.         . Cholecalciferol (VITAMIN D) 2000 UNITS tablet   Oral   Take 2,000 Units by mouth daily.         . cholestyramine (QUESTRAN) 4 GM/DOSE powder      2 scoops two times a day (mixed in juice or light kool-aid).         . cloNIDine (CATAPRES) 0.2 MG tablet   Oral   Take 0.2 mg by mouth 2 (two) times daily.         . clopidogrel (PLAVIX) 75 MG tablet   Oral   Take 1 tablet (75 mg total) by mouth daily.   30 tablet   6   . ezetimibe (ZETIA) 10 MG tablet   Oral   Take 1 tablet (10 mg total) by mouth daily.   30 tablet   3   .  FLUoxetine (PROZAC) 20 MG capsule   Oral   Take 20 mg by mouth daily.         . Fluticasone-Salmeterol (ADVAIR DISKUS) 250-50 MCG/DOSE AEPB   Inhalation   Inhale 1 puff into the lungs every morning. For asthma symptoms         . furosemide (LASIX) 40 MG  tablet   Oral   Take 40 mg by mouth daily.         Marland Kitchen gabapentin (NEURONTIN) 600 MG tablet   Oral   Take 600 mg by mouth at bedtime.          Marland Kitchen HUMALOG KWIKPEN 100 UNIT/ML SOPN   Subcutaneous   Inject 5 Units into the skin 2 (two) times daily with a meal.          . hydrALAZINE (APRESOLINE) 50 MG tablet   Oral   Take 50 mg by mouth 3 (three) times daily as needed (for high blood pressure). Does not take dose if BP low         . HYDROcodone-acetaminophen (NORCO/VICODIN) 5-325 MG per tablet   Oral   Take 1 tablet by mouth every evening.          . insulin glargine (LANTUS) 100 UNIT/ML injection   Subcutaneous   Inject 44 Units into the skin every morning.          . insulin glargine (LANTUS) 100 UNIT/ML injection   Subcutaneous   Inject 4-16 Units into the skin at bedtime as needed (For blood sugar = or >165).         Marland Kitchen loperamide (IMODIUM) 2 MG capsule   Oral   Take 2 mg by mouth as needed for diarrhea or loose stools.         Marland Kitchen losartan (COZAAR) 100 MG tablet   Oral   Take 100 mg by mouth 2 (two) times daily.          . Magnesium Oxide 250 MG TABS   Oral   Take 1 tablet by mouth 2 (two) times daily.         . metoprolol (LOPRESSOR) 50 MG tablet      25 mg 2 (two) times daily.          . Multiple Vitamin (MULTIVITAMIN) tablet   Oral   Take 1 tablet by mouth daily.           . nitroGLYCERIN (NITROSTAT) 0.3 MG SL tablet   Sublingual   Place 0.3 mg under the tongue every 5 (five) minutes x 3 doses as needed for chest pain. Last taken on 11/02/12 (took 2 doses and chest pain resolved)         . ondansetron (ZOFRAN) 4 MG tablet   Oral   Take 1 tablet (4 mg total) by mouth 2 (two) times daily as needed. For nausea   20 tablet   0   . ranitidine (ZANTAC) 150 MG tablet   Oral   Take 1 tablet (150 mg total) by mouth 2 (two) times daily.         . ranolazine (RANEXA) 1000 MG SR tablet   Oral   Take 1 tablet (1,000 mg total) by mouth 2  (two) times daily.   60 tablet   6   . rosuvastatin (CRESTOR) 40 MG tablet   Oral   Take 40 mg by mouth daily.          Marland Kitchen topiramate (TOPAMAX) 50 MG tablet   Oral  Take 1 tablet (50 mg total) by mouth 2 (two) times daily.   60 tablet   6   . triamterene-hydrochlorothiazide (DYAZIDE) 37.5-25 MG per capsule   Oral   Take 1 capsule by mouth every morning.          . vitamin B-12 (CYANOCOBALAMIN) 1000 MCG tablet   Oral   Take 1,000 mcg by mouth daily.         Marland Kitchen zinc gluconate 50 MG tablet   Oral   Take 50 mg by mouth daily.         Marland Kitchen zolpidem (AMBIEN) 10 MG tablet   Oral   Take 10 mg by mouth at bedtime as needed for sleep.           BP 163/72  Pulse 66  Temp(Src) 97.9 F (36.6 C) (Oral)  Resp 20  Ht 5\' 2"  (1.575 m)  Wt 260 lb (117.935 kg)  BMI 47.54 kg/m2  SpO2 97%  Physical Exam  Nursing note and vitals reviewed. Constitutional: She is oriented to person, place, and time. She appears well-developed and well-nourished.  HENT:  Head: Normocephalic and atraumatic.  Right Ear: External ear normal.  Left Ear: Tympanic membrane, external ear and ear canal normal.  Eyes: EOM are normal.  Neck: Neck supple. No JVD present. No tracheal deviation present. No thyromegaly present.  Left neck no erythema. No palpable mass. No significant swelling. Mild tenderness.   Cardiovascular: Normal rate, regular rhythm and normal heart sounds.   No murmur heard. Pulmonary/Chest: Effort normal and breath sounds normal. No stridor. No respiratory distress.  Musculoskeletal: Normal range of motion.  Lymphadenopathy:    She has no cervical adenopathy.  Neurological: She is alert and oriented to person, place, and time.  Skin: Skin is warm and dry.  Psychiatric: She has a normal mood and affect. Her behavior is normal.    ED Course  Procedures (including critical care time)  DIAGNOSTIC STUDIES: Oxygen Saturation is 97% on room air, normal by my interpretation.     COORDINATION OF CARE: 11:28 PM - Performed US left neck with normal results. Stent patent. No surrounding swelling.   11:34 PM - Discussed treatment plan which includes pain medication with pt at bedside and pt agreed to plan.   Labs Review Labs Reviewed - No data to display Imaging Review No results found.  EKG Interpretation   None      Medications  oxyCODONE-acetaminophen (PERCOCET/ROXICET) 5-325 MG per tablet 1 tablet (not administered)    MDM   Final diagnoses:  Neck pain    Patient is overall well-appearing.  She has no signs of cellulitis or external signs of injury.  No swelling mass.  Bedside ultrasound demonstrated normal flow through the left carotid artery.  Stent was present.  No abnormal fluid collection surrounding the carotid artery.  I personally performed the services described in this documentation, which was scribed in my presence. The recorded information has been reviewed and is accurate.      Hoy Morn, MD 03/24/13 [phone]

## 2013-03-23 NOTE — ED Notes (Signed)
Reports left side neck pain since yesterday at 5pm- pt had left carotid stent placement on 2/17

## 2013-03-23 NOTE — Telephone Encounter (Signed)
Pt. Called with c/o left earache from ear to jaw.  Denies fever/ chills.  Denies headache.  Stated the earache started yesterday.  Denies any other complaints.  Discussed with Dr. Oneida Alar.  Recommended pt. Contact her PCP for c/o earache.  Stated that an earache is not typical post-procedure symptom following carotid stent.  Call ret'd to pt.  Advised to contact her PCP, or to go to Urgent Care.  Verb. Understanding.

## 2013-04-09 ENCOUNTER — Encounter: Payer: Self-pay | Admitting: Interventional Cardiology

## 2013-04-09 ENCOUNTER — Encounter: Payer: Self-pay | Admitting: Cardiology

## 2013-04-09 ENCOUNTER — Ambulatory Visit (INDEPENDENT_AMBULATORY_CARE_PROVIDER_SITE_OTHER): Payer: Medicare Other | Admitting: Interventional Cardiology

## 2013-04-09 VITALS — BP 127/63 | HR 50 | Ht 62.0 in | Wt 280.0 lb

## 2013-04-09 DIAGNOSIS — I2510 Atherosclerotic heart disease of native coronary artery without angina pectoris: Secondary | ICD-10-CM

## 2013-04-09 DIAGNOSIS — I200 Unstable angina: Secondary | ICD-10-CM | POA: Insufficient documentation

## 2013-04-09 DIAGNOSIS — I6529 Occlusion and stenosis of unspecified carotid artery: Secondary | ICD-10-CM

## 2013-04-09 DIAGNOSIS — I209 Angina pectoris, unspecified: Secondary | ICD-10-CM

## 2013-04-09 NOTE — Patient Instructions (Addendum)
Your physician has requested that you have a CTO-cardiac catheterization. Cardiac catheterization is used to diagnose and/or treat various heart conditions. Doctors may recommend this procedure for a number of different reasons. The most common reason is to evaluate chest pain. Chest pain can be a symptom of coronary artery disease (CAD), and cardiac catheterization can show whether plaque is narrowing or blocking your heart's arteries. This procedure is also used to evaluate the valves, as well as measure the blood flow and oxygen levels in different parts of your heart. For further information please visit [email or web address]. Please follow instruction sheet, as given.  Your physician recommends that you return for lab work ON 04/17/13.

## 2013-04-09 NOTE — Progress Notes (Signed)
Patient ID: [name] [name], female   DOB: 17-Jun-1945, 68 y.o.   [name]: [id number]    [name] [name], [name], [place]  [address] Phone: [phone] Fax:  [phone]  Date:  04/09/2013   ID:  [name] [name], DOB 1946-01-18, [name] [id number]  PCP:  Marylene Land, MD      History of Present Illness: [name] [name] is a 68 y.o. female with a history of extensive coronary artery disease including coronary artery bypass grafting several years ago. Over the past 6 months, she has developed more exertional angina. Repeat cardiac catheterization revealed occluded SVG to RCA. The native RCA has 2 occluded bare-metal stents.  We've been asked by Dr. Trey Paula to evaluate her for possible revascularization for CTO.  She has been on 3 antianginal medicines. Minimal activity brings on tightness in her chest. She has to stop doing whatever activity he is doing to get relief. She has also used nitroglycerin with relief. She denies pain at rest. Of note, she recently had a carotid stent placed due to a high-grade carotid stenosis. The procedure went well.  Prior cardiac cath note from Dr. [name], her right coronary artery is described as a small vessel. It been treated with radiation as well do to restenosis but was closed even as of 2006.   Wt Readings from Last 3 Encounters:  04/09/13 280 lb (127.007 kg)  03/23/13 260 lb (117.935 kg)  03/20/13 268 lb (121.564 kg)     Past Medical History  Diagnosis Date  . Migraine   . Osteoarthritis   . Vomiting     persistent  . Asthma   . Ventral hernia     hx of  . Diabetes mellitus   . GERD (gastroesophageal reflux disease)   . Obesity 01-2010  . Hyperlipidemia   . Hypertension   . Irregular heart beat   . Myocardial infarction 2011    04/1999, 02/2000, 01/2005  . Coronary artery disease   . Heart murmur   . Shortness of breath   . Obstructive sleep apnea     no cpap- sleep study 5 yrs ago  . Anxiety   . PONV (postoperative nausea and  vomiting)   . Peripheral vascular disease     ? numbness, tingling arms and legs  . Anemia     hx  . H/O hiatal hernia   . Neuromuscular disorder     ?  Marland Kitchen Dysrhythmia   . Mental disorder     hx of confusion  . Chronic kidney disease     frequency  . Stroke 03/22/12    right side brain  . CHF (congestive heart failure)     Current Outpatient Prescriptions  Medication Sig Dispense Refill  . albuterol (PROVENTIL) (2.5 MG/3ML) 0.083% nebulizer solution Take 2.5 mg by nebulization every 6 (six) hours as needed for shortness of breath (when asthma is active). If severe asthma , use q4h around the clock x10 days.  For shortness of breath.      Marland Kitchen albuterol (PROVENTIL,VENTOLIN) 90 MCG/ACT inhaler Inhale 2 puffs into the lungs every 6 (six) hours as needed. For shortness of breath.      . ALPRAZolam (XANAX) 0.5 MG tablet Take 0.5 mg by mouth 3 (three) times daily as needed. For anxiety - Patient takes 1 in the morning and 1 in the evening daily. If needed she may take one in the afternoon.      Marland Kitchen amLODipine (NORVASC) 10 MG tablet Take  1 tablet (10 mg total) by mouth daily.  30 tablet  6  . aspirin EC 81 MG tablet Take 81 mg by mouth daily.      . B Complex-C (B-COMPLEX WITH VITAMIN C) tablet Take 1 tablet by mouth daily.      . calcium carbonate 200 MG capsule Take 1,000 mg by mouth daily with breakfast.      . Cholecalciferol (VITAMIN D) 2000 UNITS tablet Take 2,000 Units by mouth daily.      . cholestyramine (QUESTRAN) 4 GM/DOSE powder 2 scoops two times a day (mixed in juice or light kool-aid).      . cloNIDine (CATAPRES) 0.2 MG tablet Take 0.2 mg by mouth 2 (two) times daily.      . clopidogrel (PLAVIX) 75 MG tablet Take 1 tablet (75 mg total) by mouth daily.  30 tablet  6  . ezetimibe (ZETIA) 10 MG tablet Take 1 tablet (10 mg total) by mouth daily.  30 tablet  3  . FLUoxetine (PROZAC) 20 MG capsule Take 20 mg by mouth daily.      . Fluticasone-Salmeterol (ADVAIR DISKUS) 250-50 MCG/DOSE  AEPB Inhale 1 puff into the lungs every morning. For asthma symptoms      . furosemide (LASIX) 40 MG tablet Take 40 mg by mouth daily.      Marland Kitchen gabapentin (NEURONTIN) 600 MG tablet Take 600 mg by mouth at bedtime.       Marland Kitchen HUMALOG KWIKPEN 100 UNIT/ML SOPN Inject 5 Units into the skin 2 (two) times daily with a meal.       . hydrALAZINE (APRESOLINE) 50 MG tablet Take 50 mg by mouth 3 (three) times daily as needed (for high blood pressure). Does not take dose if BP low      . HYDROcodone-acetaminophen (NORCO/VICODIN) 5-325 MG per tablet Take 1 tablet by mouth every evening.       . insulin glargine (LANTUS) 100 UNIT/ML injection Inject 44 Units into the skin every morning.       . insulin glargine (LANTUS) 100 UNIT/ML injection Inject 4-16 Units into the skin at bedtime as needed (For blood sugar = or >165).      Marland Kitchen loperamide (IMODIUM) 2 MG capsule Take 2 mg by mouth as needed for diarrhea or loose stools.      Marland Kitchen losartan (COZAAR) 100 MG tablet Take 100 mg by mouth 2 (two) times daily.       . Magnesium Oxide 250 MG TABS Take 1 tablet by mouth 2 (two) times daily.      . metoprolol (LOPRESSOR) 50 MG tablet 25 mg 2 (two) times daily.       . Multiple Vitamin (MULTIVITAMIN) tablet Take 1 tablet by mouth daily.        . nitroGLYCERIN (NITROSTAT) 0.3 MG SL tablet Place 0.3 mg under the tongue every 5 (five) minutes x 3 doses as needed for chest pain. Last taken on 11/02/12 (took 2 doses and chest pain resolved)      . omeprazole (PRILOSEC) 20 MG capsule 20 mg daily.       . ondansetron (ZOFRAN) 4 MG tablet Take 1 tablet (4 mg total) by mouth 2 (two) times daily as needed. For nausea  20 tablet  0  . oxyCODONE-acetaminophen (PERCOCET/ROXICET) 5-325 MG per tablet Take 1 tablet by mouth every 4 (four) hours as needed for severe pain.  20 tablet  0  . ranitidine (ZANTAC) 150 MG tablet Take 1 tablet (150 mg total) by  mouth 2 (two) times daily.      . ranolazine (RANEXA) 1000 MG SR tablet Take 1 tablet (1,000 mg  total) by mouth 2 (two) times daily.  60 tablet  6  . rosuvastatin (CRESTOR) 40 MG tablet Take 40 mg by mouth daily.       Marland Kitchen topiramate (TOPAMAX) 50 MG tablet Take 1 tablet (50 mg total) by mouth 2 (two) times daily.  60 tablet  6  . triamterene-hydrochlorothiazide (DYAZIDE) 37.5-25 MG per capsule Take 1 capsule by mouth every morning.       . vitamin B-12 (CYANOCOBALAMIN) 1000 MCG tablet Take 1,000 mcg by mouth daily.      Marland Kitchen zinc gluconate 50 MG tablet Take 50 mg by mouth daily.      Marland Kitchen zolpidem (AMBIEN) 10 MG tablet Take 10 mg by mouth at bedtime as needed for sleep.        No current facility-administered medications for this visit.    Allergies:    Allergies  Allergen Reactions  . Amoxicillin Shortness Of Breath and Rash  . Erythromycin Shortness Of Breath and Other (See Comments)    Trouble swallowing  . Penicillins Shortness Of Breath and Rash  . Metformin And Related     dizziness and nausea  . Tape Other (See Comments)    Plastic tape causes skin to rip if left on for long periods of time  . Isosorbide Mononitrate [Isosorbide]     Joint aches, muscles hurt, difficult to walk    Social History:  The patient  reports that she has never smoked. She has never used smokeless tobacco. She reports that she does not drink alcohol or use illicit drugs.   Family History:  The patient's family history includes Cancer in her sister; Deep vein thrombosis in her father; Diabetes in her father and another family member; Heart attack in her father; Heart disease in her father; Hyperlipidemia in her father; Hypertension in her father and mother.   ROS:  Please see the history of present illness.  No nausea, vomiting.  No fevers, chills.  No focal weakness.  No dysuria.   All other systems reviewed and negative.   PHYSICAL EXAM: VS:  BP 127/63  Pulse 50  Ht 5\' 2"  (1.575 m)  Wt 280 lb (127.007 kg)  BMI 51.20 kg/m2 Well nourished, well developed, in no acute distress HEENT: normal Neck: no  JVD, no carotid bruits Cardiac:  normal S1, S2; RRR;  Lungs:  clear to auscultation bilaterally, no wheezing, rhonchi or rales Abd: soft, nontender, no hepatomegaly, Obese Ext: no edema Skin: warm and dry Neuro:   no focal abnormalities noted       ASSESSMENT AND PLAN:  1. CAD/exertional angina: I reviewed her catheterization films in detail with Dr. Algernon Huxley and with Dr. Martinique. The Challenge with her occluded RCA is that it is very close to the ostium where the occlusion begins.  Will attempt chronic total occlusion revascularization. Will likely have to try with a stiff/slick wire to see if we can get through the proximal. Perhaps a cross boss will be needed, but this will be difficult to deliver due to the fact that we may not have much purchase in the proximal vessel. All of this was explained to the patient including the additional risks of chronic total occlusion revascularization including, dissection, perforation, bleeding, stroke among other risks. All questions were answered. She is willing to proceed due to the severity of her symptoms. Continue metoprolol, ranolazine and amlodipine  for antianginal therapy. She has issues with headaches so isosorbide has not been used. 2. Of note, we will continue her Plavix given her recent carotid stent. I believe, she would need to be on the Plavix for at least a month prior to attempting intervention. Unclear exactly how long her RCA has been completely occluded.  It has been years for sure given the prior cath reports. I explained to her that the longer the artery has been occluded, the lower the chance of success. She is aware of this. Despite it being described as a small vessel in prior reports, the proximal vessel does look sizable. We'll have to see how the rest of the vessel does with revascularization.  Signed, Mina Marble, MD, [facility] 04/09/2013 3:28 PM

## 2013-04-10 ENCOUNTER — Encounter (HOSPITAL_COMMUNITY): Payer: Self-pay | Admitting: Pharmacy Technician

## 2013-04-17 ENCOUNTER — Other Ambulatory Visit (INDEPENDENT_AMBULATORY_CARE_PROVIDER_SITE_OTHER): Payer: Medicare Other

## 2013-04-17 DIAGNOSIS — I209 Angina pectoris, unspecified: Secondary | ICD-10-CM

## 2013-04-17 LAB — BASIC METABOLIC PANEL
BUN: 27 mg/dL — ABNORMAL HIGH (ref 6–23)
CO2: 31 mEq/L (ref 19–32)
Calcium: 9.4 mg/dL (ref 8.4–10.5)
Chloride: 103 mEq/L (ref 96–112)
Creatinine, Ser: 0.9 mg/dL (ref 0.4–1.2)
GFR: 64.24 mL/min (ref >60.00)
Glucose, Bld: 166 mg/dL — ABNORMAL HIGH (ref 70–99)
Potassium: 4.5 mEq/L (ref 3.5–5.1)
Sodium: 140 mEq/L (ref 135–145)

## 2013-04-17 LAB — CBC
HCT: 38.9 % (ref 36.0–46.0)
Hemoglobin: 13.1 g/dL (ref 12.0–15.0)
MCHC: 33.7 g/dL (ref 30.0–36.0)
MCV: 95.4 fl (ref 78.0–100.0)
Platelets: 221 10*3/uL (ref 150.0–400.0)
RBC: 4.08 Mil/uL (ref 3.87–5.11)
RDW: 13 % (ref 11.5–14.6)
WBC: 8.5 10*3/uL (ref 4.5–10.5)

## 2013-04-17 LAB — PROTIME-INR
INR: 1.1 ratio — ABNORMAL HIGH (ref 0.8–1.0)
Prothrombin Time: 11.7 s (ref 10.2–12.4)

## 2013-04-20 ENCOUNTER — Encounter: Payer: Self-pay | Admitting: Surgery

## 2013-04-23 ENCOUNTER — Other Ambulatory Visit: Payer: Self-pay | Admitting: Interventional Cardiology

## 2013-04-23 ENCOUNTER — Ambulatory Visit (INDEPENDENT_AMBULATORY_CARE_PROVIDER_SITE_OTHER)
Admission: RE | Admit: 2013-04-23 | Discharge: 2013-04-23 | Disposition: A | Discharge status: Home / Self Care | Payer: Medicare Other | Source: Ambulatory Visit | Attending: Surgery | Admitting: Surgery

## 2013-04-23 ENCOUNTER — Ambulatory Visit (INDEPENDENT_AMBULATORY_CARE_PROVIDER_SITE_OTHER): Payer: Self-pay | Admitting: Surgery

## 2013-04-23 ENCOUNTER — Encounter: Payer: Self-pay | Admitting: Surgery

## 2013-04-23 VITALS — BP 147/39 | HR 50 | Ht 62.0 in | Wt 279.0 lb

## 2013-04-23 DIAGNOSIS — I6529 Occlusion and stenosis of unspecified carotid artery: Secondary | ICD-10-CM | POA: Insufficient documentation

## 2013-04-23 DIAGNOSIS — Z48812 Encounter for surgical aftercare following surgery on the circulatory system: Secondary | ICD-10-CM | POA: Insufficient documentation

## 2013-04-23 NOTE — Progress Notes (Signed)
The patient is back for her first post procedure visit.  On 03/20/2013 she underwent left carotid stent with distal embolic protection.  This was done in the setting of a symptomatic left carotid stenosis.  The patient was having nearly daily episodes of a fascia.  Since her procedure she has had only 2 episodes which have not been as severe.  She has also reported headaches.  She reports no focal prominent neurologic symptoms.  On examination she is well-appearing in no distress.  Neurologically she is intact.  Her carotid artery does not have a bruit.  I have reviewed her ultrasound today.  This shows a widely patent left carotid stent.  The right carotid remains less than 40%.  Overall she is doing very well.  She has continued followup with Dr. Estanislado Pandy for vertebral artery stenosis as well as intracranial aneurysm.  She will followup here in 6 months with a repeat ultrasound.

## 2013-04-24 ENCOUNTER — Encounter (HOSPITAL_COMMUNITY)
Admission: RE | Disposition: A | Discharge status: Home / Self Care | Payer: Self-pay | Source: Ambulatory Visit | Attending: Interventional Cardiology

## 2013-04-24 ENCOUNTER — Ambulatory Visit (HOSPITAL_COMMUNITY)
Admission: RE | Admit: 2013-04-24 | Discharge: 2013-04-25 | Disposition: A | Discharge status: Home / Self Care | Payer: Medicare Other | Source: Ambulatory Visit | Attending: Interventional Cardiology | Admitting: Interventional Cardiology

## 2013-04-24 ENCOUNTER — Encounter (HOSPITAL_COMMUNITY): Payer: Self-pay | Admitting: General Practice

## 2013-04-24 DIAGNOSIS — I739 Peripheral vascular disease, unspecified: Secondary | ICD-10-CM | POA: Insufficient documentation

## 2013-04-24 DIAGNOSIS — G43909 Migraine, unspecified, not intractable, without status migrainosus: Secondary | ICD-10-CM | POA: Insufficient documentation

## 2013-04-24 DIAGNOSIS — I2582 Chronic total occlusion of coronary artery: Secondary | ICD-10-CM | POA: Insufficient documentation

## 2013-04-24 DIAGNOSIS — J45909 Unspecified asthma, uncomplicated: Secondary | ICD-10-CM | POA: Insufficient documentation

## 2013-04-24 DIAGNOSIS — E119 Type 2 diabetes mellitus without complications: Secondary | ICD-10-CM | POA: Insufficient documentation

## 2013-04-24 DIAGNOSIS — I209 Angina pectoris, unspecified: Secondary | ICD-10-CM | POA: Insufficient documentation

## 2013-04-24 DIAGNOSIS — Z7982 Long term (current) use of aspirin: Secondary | ICD-10-CM | POA: Insufficient documentation

## 2013-04-24 DIAGNOSIS — F411 Generalized anxiety disorder: Secondary | ICD-10-CM | POA: Insufficient documentation

## 2013-04-24 DIAGNOSIS — E669 Obesity, unspecified: Secondary | ICD-10-CM | POA: Insufficient documentation

## 2013-04-24 DIAGNOSIS — R0600 Dyspnea, unspecified: Secondary | ICD-10-CM

## 2013-04-24 DIAGNOSIS — I10 Essential (primary) hypertension: Secondary | ICD-10-CM

## 2013-04-24 DIAGNOSIS — I5032 Chronic diastolic (congestive) heart failure: Secondary | ICD-10-CM

## 2013-04-24 DIAGNOSIS — E785 Hyperlipidemia, unspecified: Secondary | ICD-10-CM | POA: Insufficient documentation

## 2013-04-24 DIAGNOSIS — I252 Old myocardial infarction: Secondary | ICD-10-CM | POA: Insufficient documentation

## 2013-04-24 DIAGNOSIS — M1990 Unspecified osteoarthritis, unspecified site: Secondary | ICD-10-CM | POA: Insufficient documentation

## 2013-04-24 DIAGNOSIS — I2510 Atherosclerotic heart disease of native coronary artery without angina pectoris: Secondary | ICD-10-CM | POA: Insufficient documentation

## 2013-04-24 DIAGNOSIS — Z8249 Family history of ischemic heart disease and other diseases of the circulatory system: Secondary | ICD-10-CM | POA: Insufficient documentation

## 2013-04-24 DIAGNOSIS — I200 Unstable angina: Secondary | ICD-10-CM | POA: Diagnosis present

## 2013-04-24 DIAGNOSIS — T82897A Other specified complication of cardiac prosthetic devices, implants and grafts, initial encounter: Secondary | ICD-10-CM | POA: Insufficient documentation

## 2013-04-24 DIAGNOSIS — N189 Chronic kidney disease, unspecified: Secondary | ICD-10-CM | POA: Insufficient documentation

## 2013-04-24 DIAGNOSIS — G4733 Obstructive sleep apnea (adult) (pediatric): Secondary | ICD-10-CM | POA: Insufficient documentation

## 2013-04-24 DIAGNOSIS — I2542 Coronary artery dissection: Secondary | ICD-10-CM | POA: Insufficient documentation

## 2013-04-24 DIAGNOSIS — Y831 Surgical operation with implant of artificial internal device as the cause of abnormal reaction of the patient, or of later complication, without mention of misadventure at the time of the procedure: Secondary | ICD-10-CM | POA: Insufficient documentation

## 2013-04-24 DIAGNOSIS — Z7902 Long term (current) use of antithrombotics/antiplatelets: Secondary | ICD-10-CM | POA: Insufficient documentation

## 2013-04-24 DIAGNOSIS — K219 Gastro-esophageal reflux disease without esophagitis: Secondary | ICD-10-CM | POA: Insufficient documentation

## 2013-04-24 DIAGNOSIS — Z9861 Coronary angioplasty status: Secondary | ICD-10-CM | POA: Insufficient documentation

## 2013-04-24 DIAGNOSIS — E118 Type 2 diabetes mellitus with unspecified complications: Secondary | ICD-10-CM | POA: Diagnosis present

## 2013-04-24 DIAGNOSIS — I25810 Atherosclerosis of coronary artery bypass graft(s) without angina pectoris: Secondary | ICD-10-CM | POA: Insufficient documentation

## 2013-04-24 DIAGNOSIS — I129 Hypertensive chronic kidney disease with stage 1 through stage 4 chronic kidney disease, or unspecified chronic kidney disease: Secondary | ICD-10-CM | POA: Insufficient documentation

## 2013-04-24 HISTORY — DX: Headache: R51

## 2013-04-24 HISTORY — DX: Other chronic pain: G89.29

## 2013-04-24 HISTORY — DX: Claustrophobia: F40.240

## 2013-04-24 HISTORY — PX: PERCUTANEOUS STENT INTERVENTION: SHX5500

## 2013-04-24 HISTORY — DX: Headache, unspecified: R51.9

## 2013-04-24 HISTORY — DX: Low back pain: M54.5

## 2013-04-24 HISTORY — DX: Low back pain, unspecified: M54.50

## 2013-04-24 HISTORY — DX: Type 2 diabetes mellitus without complications: E11.9

## 2013-04-24 HISTORY — DX: Unspecified chronic bronchitis: J42

## 2013-04-24 HISTORY — DX: Pneumonia, unspecified organism: J18.9

## 2013-04-24 HISTORY — DX: Migraine, unspecified, not intractable, without status migrainosus: G43.909

## 2013-04-24 HISTORY — DX: Angina pectoris, unspecified: I20.9

## 2013-04-24 LAB — POCT ACTIVATED CLOTTING TIME
Activated Clotting Time: 199 seconds
Activated Clotting Time: 232 seconds
Activated Clotting Time: 254 seconds
Activated Clotting Time: 265 seconds
Activated Clotting Time: 260 seconds
Activated Clotting Time: 271 seconds
Activated Clotting Time: 248 seconds
Activated Clotting Time: 321 seconds
Activated Clotting Time: 260 seconds
Activated Clotting Time: 265 seconds
Activated Clotting Time: 193 seconds
Activated Clotting Time: 155 seconds

## 2013-04-24 LAB — GLUCOSE, CAPILLARY
Glucose-Capillary: 209 mg/dL — ABNORMAL HIGH (ref 70–99)
Glucose-Capillary: 121 mg/dL — ABNORMAL HIGH (ref 70–99)
Glucose-Capillary: 113 mg/dL — ABNORMAL HIGH (ref 70–99)
Glucose-Capillary: 159 mg/dL — ABNORMAL HIGH (ref 70–99)

## 2013-04-24 SURGERY — PERCUTANEOUS STENT INTERVENTION
Anesthesia: LOCAL

## 2013-04-24 MED ORDER — MIDAZOLAM HCL 2 MG/2ML IJ SOLN
INTRAMUSCULAR | Status: AC
  Filled 2013-04-24: qty 2

## 2013-04-24 MED ORDER — HYDRALAZINE HCL 50 MG PO TABS
50.0000 mg | ORAL_TABLET | Freq: Three times a day (TID) | ORAL | Status: DC | PRN
  Filled 2013-04-24: qty 1

## 2013-04-24 MED ORDER — RANOLAZINE ER 500 MG PO TB12
1000.0000 mg | ORAL_TABLET | Freq: Two times a day (BID) | ORAL | Status: DC
  Administered 2013-04-24 – 2013-04-25 (×2): 1000 mg via ORAL
  Filled 2013-04-24 (×3): qty 2

## 2013-04-24 MED ORDER — VITAMIN B-12 1000 MCG PO TABS
1000.0000 ug | ORAL_TABLET | Freq: Every day | ORAL | Status: DC
  Administered 2013-04-24: 20:00:00 1000 ug via ORAL
  Filled 2013-04-24 (×2): qty 1

## 2013-04-24 MED ORDER — FLUOXETINE HCL 20 MG PO CAPS
20.0000 mg | ORAL_CAPSULE | Freq: Every day | ORAL | Status: DC
  Filled 2013-04-24 (×2): qty 1

## 2013-04-24 MED ORDER — ALBUTEROL 90 MCG/ACT IN AERS: 2.0000 | INHALATION_SPRAY | Freq: Four times a day (QID) | RESPIRATORY_TRACT | Status: DC | PRN

## 2013-04-24 MED ORDER — LOPERAMIDE HCL 2 MG PO CAPS: 2.0000 mg | ORAL_CAPSULE | ORAL | Status: DC | PRN

## 2013-04-24 MED ORDER — EZETIMIBE 10 MG PO TABS
10.0000 mg | ORAL_TABLET | Freq: Every day | ORAL | Status: DC
  Administered 2013-04-24 – 2013-04-25 (×2): 10 mg via ORAL
  Filled 2013-04-24 (×2): qty 1

## 2013-04-24 MED ORDER — CLOPIDOGREL BISULFATE 75 MG PO TABS
75.0000 mg | ORAL_TABLET | Freq: Every day | ORAL | Status: DC
  Administered 2013-04-25: 12:00:00 75 mg via ORAL
  Filled 2013-04-24: qty 1

## 2013-04-24 MED ORDER — INSULIN ASPART 100 UNIT/ML ~~LOC~~ SOLN
5.0000 [IU] | Freq: Two times a day (BID) | SUBCUTANEOUS | Status: DC
  Administered 2013-04-24 – 2013-04-25 (×2): 5 [IU] via SUBCUTANEOUS

## 2013-04-24 MED ORDER — FENTANYL CITRATE 0.05 MG/ML IJ SOLN
INTRAMUSCULAR | Status: AC
  Filled 2013-04-24: qty 2

## 2013-04-24 MED ORDER — FAMOTIDINE 10 MG PO TABS
10.0000 mg | ORAL_TABLET | Freq: Every day | ORAL | Status: DC
  Administered 2013-04-24: 10 mg via ORAL
  Filled 2013-04-24 (×2): qty 1

## 2013-04-24 MED ORDER — LIDOCAINE HCL (PF) 1 % IJ SOLN
INTRAMUSCULAR | Status: AC
  Filled 2013-04-24: qty 30

## 2013-04-24 MED ORDER — INSULIN LISPRO 100 UNIT/ML (KWIKPEN): 5.0000 [IU] | PEN_INJECTOR | Freq: Two times a day (BID) | SUBCUTANEOUS | Status: DC

## 2013-04-24 MED ORDER — SODIUM CHLORIDE 0.9 % IV SOLN
INTRAVENOUS | Status: DC
  Administered 2013-04-24 (×2): via INTRAVENOUS

## 2013-04-24 MED ORDER — GABAPENTIN 600 MG PO TABS
600.0000 mg | ORAL_TABLET | Freq: Every day | ORAL | Status: DC
  Administered 2013-04-24: 21:00:00 600 mg via ORAL
  Filled 2013-04-24 (×2): qty 1

## 2013-04-24 MED ORDER — OXYCODONE-ACETAMINOPHEN 5-325 MG PO TABS: 1.0000 | ORAL_TABLET | ORAL | Status: DC | PRN

## 2013-04-24 MED ORDER — ASPIRIN EC 81 MG PO TBEC: 81.0000 mg | DELAYED_RELEASE_TABLET | Freq: Every day | ORAL | Status: DC

## 2013-04-24 MED ORDER — HEPARIN SODIUM (PORCINE) 1000 UNIT/ML IJ SOLN
INTRAMUSCULAR | Status: AC
  Filled 2013-04-24: qty 1

## 2013-04-24 MED ORDER — HEPARIN (PORCINE) IN NACL 2-0.9 UNIT/ML-% IJ SOLN
INTRAMUSCULAR | Status: AC
  Filled 2013-04-24: qty 1000

## 2013-04-24 MED ORDER — SODIUM CHLORIDE 0.9 % IJ SOLN: 3.0000 mL | INTRAMUSCULAR | Status: DC | PRN

## 2013-04-24 MED ORDER — CLONIDINE HCL 0.2 MG PO TABS
0.2000 mg | ORAL_TABLET | Freq: Two times a day (BID) | ORAL | Status: DC
  Administered 2013-04-24 – 2013-04-25 (×2): 0.2 mg via ORAL
  Filled 2013-04-24 (×3): qty 1

## 2013-04-24 MED ORDER — ASPIRIN 81 MG PO CHEW
81.0000 mg | CHEWABLE_TABLET | Freq: Every day | ORAL | Status: DC
  Administered 2013-04-25: 81 mg via ORAL
  Filled 2013-04-24: qty 1

## 2013-04-24 MED ORDER — ZOLPIDEM TARTRATE 5 MG PO TABS: 5.0000 mg | ORAL_TABLET | Freq: Every evening | ORAL | Status: DC | PRN

## 2013-04-24 MED ORDER — INSULIN GLARGINE 100 UNIT/ML ~~LOC~~ SOLN: 4.0000 [IU] | Freq: Every evening | SUBCUTANEOUS | Status: DC | PRN

## 2013-04-24 MED ORDER — SODIUM CHLORIDE 0.9 % IV SOLN: 250.0000 mL | INTRAVENOUS | Status: DC | PRN

## 2013-04-24 MED ORDER — SODIUM CHLORIDE 0.9 % IV SOLN
INTRAVENOUS | Status: DC
  Administered 2013-04-24: 07:00:00 via INTRAVENOUS

## 2013-04-24 MED ORDER — ALPRAZOLAM 0.5 MG PO TABS: 0.5000 mg | ORAL_TABLET | Freq: Three times a day (TID) | ORAL | Status: DC | PRN

## 2013-04-24 MED ORDER — AMLODIPINE BESYLATE 10 MG PO TABS
10.0000 mg | ORAL_TABLET | Freq: Every day | ORAL | Status: DC
  Administered 2013-04-25: 10 mg via ORAL
  Filled 2013-04-24 (×2): qty 1

## 2013-04-24 MED ORDER — INSULIN GLARGINE 100 UNIT/ML ~~LOC~~ SOLN
44.0000 [IU] | Freq: Every morning | SUBCUTANEOUS | Status: DC
  Administered 2013-04-25: 12:00:00 44 [IU] via SUBCUTANEOUS
  Filled 2013-04-24: qty 0.44

## 2013-04-24 MED ORDER — ATORVASTATIN CALCIUM 80 MG PO TABS
80.0000 mg | ORAL_TABLET | Freq: Every day | ORAL | Status: DC
  Filled 2013-04-24: qty 1

## 2013-04-24 MED ORDER — NITROGLYCERIN 0.2 MG/ML ON CALL CATH LAB
INTRAVENOUS | Status: AC
  Filled 2013-04-24: qty 1

## 2013-04-24 MED ORDER — METOPROLOL TARTRATE 25 MG PO TABS
25.0000 mg | ORAL_TABLET | Freq: Two times a day (BID) | ORAL | Status: DC
  Administered 2013-04-24 – 2013-04-25 (×2): 25 mg via ORAL
  Filled 2013-04-24 (×3): qty 1

## 2013-04-24 MED ORDER — ONDANSETRON HCL 4 MG PO TABS
4.0000 mg | ORAL_TABLET | Freq: Two times a day (BID) | ORAL | Status: DC | PRN
  Administered 2013-04-25: 10:00:00 4 mg via ORAL
  Filled 2013-04-24: qty 1

## 2013-04-24 MED ORDER — CHOLESTYRAMINE 4 G PO PACK
4.0000 g | PACK | Freq: Two times a day (BID) | ORAL | Status: DC
  Administered 2013-04-24: 21:00:00 4 g via ORAL
  Filled 2013-04-24 (×3): qty 1

## 2013-04-24 MED ORDER — ASPIRIN 81 MG PO CHEW
81.0000 mg | CHEWABLE_TABLET | ORAL | Status: AC
  Administered 2013-04-24: 81 mg via ORAL
  Filled 2013-04-24: qty 1

## 2013-04-24 MED ORDER — HYDROCODONE-ACETAMINOPHEN 5-325 MG PO TABS
1.0000 | ORAL_TABLET | Freq: Every evening | ORAL | Status: DC
  Administered 2013-04-24: 20:00:00 1 via ORAL
  Filled 2013-04-24: qty 1

## 2013-04-24 MED ORDER — LOSARTAN POTASSIUM 50 MG PO TABS
100.0000 mg | ORAL_TABLET | Freq: Every day | ORAL | Status: DC
  Administered 2013-04-25: 12:00:00 100 mg via ORAL
  Filled 2013-04-24: qty 2

## 2013-04-24 MED ORDER — TOPIRAMATE 25 MG PO TABS
50.0000 mg | ORAL_TABLET | Freq: Two times a day (BID) | ORAL | Status: DC
  Administered 2013-04-24: 50 mg via ORAL
  Filled 2013-04-24 (×3): qty 2

## 2013-04-24 MED ORDER — PANTOPRAZOLE SODIUM 40 MG PO TBEC
40.0000 mg | DELAYED_RELEASE_TABLET | Freq: Every day | ORAL | Status: DC
  Administered 2013-04-25: 40 mg via ORAL
  Filled 2013-04-24: qty 1

## 2013-04-24 MED ORDER — CLOPIDOGREL BISULFATE 75 MG PO TABS: 75.0000 mg | ORAL_TABLET | Freq: Every day | ORAL | Status: DC

## 2013-04-24 MED ORDER — ROSUVASTATIN CALCIUM 40 MG PO TABS
40.0000 mg | ORAL_TABLET | Freq: Every day | ORAL | Status: DC
  Administered 2013-04-24: 20:00:00 40 mg via ORAL
  Filled 2013-04-24 (×2): qty 1

## 2013-04-24 MED ORDER — ALBUTEROL SULFATE (2.5 MG/3ML) 0.083% IN NEBU: 2.5000 mg | INHALATION_SOLUTION | Freq: Four times a day (QID) | RESPIRATORY_TRACT | Status: DC | PRN

## 2013-04-24 MED ORDER — SODIUM CHLORIDE 0.9 % IJ SOLN: 3.0000 mL | Freq: Two times a day (BID) | INTRAMUSCULAR | Status: DC

## 2013-04-24 NOTE — Addendum Note (Signed)
Addended by: Mena Goes on: 04/24/2013 12:17 PM   Modules accepted: Orders

## 2013-04-24 NOTE — Interval H&P Note (Signed)
Cath Lab Visit (complete for each Cath Lab visit)  Clinical Evaluation Leading to the Procedure:   ACS: no  Non-ACS:    Anginal Classification: CCS III  Anti-ischemic medical therapy: Maximal Therapy (2 or more classes of medications)  Non-Invasive Test Results: Intermediate-risk stress test findings: cardiac mortality 1-3%/year  Prior CABG: Previous CABG      History and Physical Interval Note:  04/24/2013 7:47 AM  [name]  has presented today for surgery, with the diagnosis of Angina  The various methods of treatment have been discussed with the patient and family. After consideration of risks, benefits and other options for treatment, the patient has consented to  Procedure(s): PERCUTANEOUS STENT INTERVENTION (N/A) as a surgical intervention .  The patient's history has been reviewed, patient examined, no change in status, stable for surgery.  I have reviewed the patient's chart and labs.  Questions were answered to the patient's satisfaction.     Airik Goodlin S.

## 2013-04-24 NOTE — CV Procedure (Signed)
PROCEDURE:  PCI RCA CTO, IVUS RCA  INDICATIONS:  Intractable angina  The risks, benefits, and details of the procedure were explained to the patient.  The patient verbalized understanding and wanted to proceed.  Informed written consent was obtained.  PROCEDURE TECHNIQUE:  After Xylocaine anesthesia a 18F sheath was placed in the right femoral artery with a single anterior needle wall stick.  After a seizure, a 5 French sheath was placed into the left femoral artery using a single anterior needle wall stick. Intravenous heparin was given. Multiple ACTs were used to check that the heparin was therapeutic. Left coronary angiography was done using a Judkins L4 guide catheter.  Right coronary angiography was initially started using a Judkins R4 guide catheter. We then switched to an AL-1 guide. We then switched to an ART 3.5 guide and performed the remainder of the procedure.   CONTRAST:  Total of 215 cc given.  COMPLICATIONS:  None.      ANGIOGRAPHIC DATA:    The right coronary artery is occluded just past the ostium. There are left to right collaterals.  There is a proximal stent which is approximately 30 mm an occluded. In the distal RCA, there is a distal stent which is heavily diseased. The entire mid RCA is heavily diseased noted from collaterals from the left system.  PCI narrative:  Initially, the JR 4 guiding catheter was used to engage the right coronary artery. A 5 [place] guide catheter was placed into the left main. A Fielder wire was advanced across the proximal Into the mid portion of the proximal stent. The [facility] wire would not cross the entire occlusion of the stented segment. We then tried to advance a 2.0 x 12 over the wire balloon but there was not enough support to get this across the proximal calf. We switched out guides to an AL-1. We tried getting a balloon as well as a cross boss into the proximal cap, but the support was inadequate. We then switched to the ART. Guide.   With a Confianza Pro 12 wire, we were able to cross the proximal cath into the more distal portion of the stent. We're able to get a 1.25 x 20 sprinter legend balloon into the area of occlusion and perform several dilatations. We then switched out for an over-the-wire 1.5 x 15 Maverick balloon and did multiple inflations of the stented segment. We switched out the Confianza Pro 12 wire for a miracle Brother 6 g wire. This appeared to be in the true lumen just past the stent. We advanced our balloon further down and tried to advance a BMW wire into the right coronary artery. We are unable to wire this into the distal vessel. We tried with a pro-water wire as well as a PT 2. Despite multiple attempts, this was unsuccessful in getting all the way to the distal RCA stent. The PT 2 did now head the distal RCA but would not cross the stent. Multiple times were made with a prolapsed wire. We also tried with a miracle Brothers 6 g wire were unsuccessful. It was clear that we were wiring into an RV marginal which made [place] think we were intraluminal at some point. Multiple balloon inflations with a 2.25 emerge balloon were performed in the stented segment. We then advanced an IVUS catheter to the mid RCA. Upon pullback, was noted that we were luminal just past the stent.  After the IVUS we then advanced the emerge balloon just beyond the  distal edge of the stent. Several balloon inflations were performed. A cutting balloon was then advanced, 2.75 x 15. This is inflated several times within the stented segment. Angiogram was performed and this revealed normal flow into several marginal vessels. There was dissection noted in the distal RCA. The collaterals were still intact. There appeared to be in antegrade channel into the distal RCA as well.  IMPRESSIONS:  1. Successful cutting balloon angioplasty of the proximal RCA occlusion. This was reduced to a 25% stenosis.  We were unable to cross the disease in the distal RCA into  the distal stent due to heavy diffuse disease.     2.   IVUS reveals that we were intraluminal in the proximal to the beginning portion of the mid RCA. A wire reentered a distal RV marginal branch which is clearly intraluminal.  RECOMMENDATION:  Watch overnight. Hopefully, after a few weeks of time, the dissection in the mid to distal RCA will heal and there will be a track for [place] to complete revascularization of the distal occlusion.  Continue dual antiplatelet therapy. Continue aggressive medical therapy.

## 2013-04-24 NOTE — Progress Notes (Signed)
Site area: right groin  Site Prior to Removal:  Level 0  Pressure Applied For 20 MINUTES    Minutes Beginning at 1610  Manual:   yes  Patient Status During Pull:  stable  Post Pull Groin Site:  Level 0  Post Pull Instructions Given:  yes  Post Pull Pulses Present:  yes  Dressing Applied:  yes  Comments: 1630 Gauze dressing applied. Rechecked at 1645 and 1700 without change. Dressing remains dry and intact, CSMs wnls, dorsalis pedis pulse +2.

## 2013-04-24 NOTE — H&P (View-Only) (Signed)
Patient ID: [name] [name], female   DOB: 1945/10/11, 68 y.o.   [name]: [id number]    [address], [place], [place]  [address] Phone: [phone] Fax:  [phone]  Date:  04/09/2013   ID:  [name] [name], DOB 09-27-1945, [name] [id number]  PCP:  Marylene Land, MD      History of Present Illness: [name] [name] is a 68 y.o. female with a history of extensive coronary artery disease including coronary artery bypass grafting several years ago. Over the past 6 months, she has developed more exertional angina. Repeat cardiac catheterization revealed occluded SVG to RCA. The native RCA has 2 occluded bare-metal stents.  We've been asked by Dr. Trey Paula to evaluate her for possible revascularization for CTO.  She has been on 3 antianginal medicines. Minimal activity brings on tightness in her chest. She has to stop doing whatever activity he is doing to get relief. She has also used nitroglycerin with relief. She denies pain at rest. Of note, she recently had a carotid stent placed due to a high-grade carotid stenosis. The procedure went well.  Prior cardiac cath note from Dr. [name], her right coronary artery is described as a small vessel. It been treated with radiation as well do to restenosis but was closed even as of 2006.   Wt Readings from Last 3 Encounters:  04/09/13 280 lb (127.007 kg)  03/23/13 260 lb (117.935 kg)  03/20/13 268 lb (121.564 kg)     Past Medical History  Diagnosis Date  . Migraine   . Osteoarthritis   . Vomiting     persistent  . Asthma   . Ventral hernia     hx of  . Diabetes mellitus   . GERD (gastroesophageal reflux disease)   . Obesity 01-2010  . Hyperlipidemia   . Hypertension   . Irregular heart beat   . Myocardial infarction 2011    04/1999, 02/2000, 01/2005  . Coronary artery disease   . Heart murmur   . Shortness of breath   . Obstructive sleep apnea     no cpap- sleep study 5 yrs ago  . Anxiety   . PONV (postoperative nausea and  vomiting)   . Peripheral vascular disease     ? numbness, tingling arms and legs  . Anemia     hx  . H/O hiatal hernia   . Neuromuscular disorder     ?  Marland Kitchen Dysrhythmia   . Mental disorder     hx of confusion  . Chronic kidney disease     frequency  . Stroke 03/22/12    right side brain  . CHF (congestive heart failure)     Current Outpatient Prescriptions  Medication Sig Dispense Refill  . albuterol (PROVENTIL) (2.5 MG/3ML) 0.083% nebulizer solution Take 2.5 mg by nebulization every 6 (six) hours as needed for shortness of breath (when asthma is active). If severe asthma , use q4h around the clock x10 days.  For shortness of breath.      Marland Kitchen albuterol (PROVENTIL,VENTOLIN) 90 MCG/ACT inhaler Inhale 2 puffs into the lungs every 6 (six) hours as needed. For shortness of breath.      . ALPRAZolam (XANAX) 0.5 MG tablet Take 0.5 mg by mouth 3 (three) times daily as needed. For anxiety - Patient takes 1 in the morning and 1 in the evening daily. If needed she may take one in the afternoon.      Marland Kitchen amLODipine (NORVASC) 10 MG tablet Take  1 tablet (10 mg total) by mouth daily.  30 tablet  6  . aspirin EC 81 MG tablet Take 81 mg by mouth daily.      . B Complex-C (B-COMPLEX WITH VITAMIN C) tablet Take 1 tablet by mouth daily.      . calcium carbonate 200 MG capsule Take 1,000 mg by mouth daily with breakfast.      . Cholecalciferol (VITAMIN D) 2000 UNITS tablet Take 2,000 Units by mouth daily.      . cholestyramine (QUESTRAN) 4 GM/DOSE powder 2 scoops two times a day (mixed in juice or light kool-aid).      . cloNIDine (CATAPRES) 0.2 MG tablet Take 0.2 mg by mouth 2 (two) times daily.      . clopidogrel (PLAVIX) 75 MG tablet Take 1 tablet (75 mg total) by mouth daily.  30 tablet  6  . ezetimibe (ZETIA) 10 MG tablet Take 1 tablet (10 mg total) by mouth daily.  30 tablet  3  . FLUoxetine (PROZAC) 20 MG capsule Take 20 mg by mouth daily.      . Fluticasone-Salmeterol (ADVAIR DISKUS) 250-50 MCG/DOSE  AEPB Inhale 1 puff into the lungs every morning. For asthma symptoms      . furosemide (LASIX) 40 MG tablet Take 40 mg by mouth daily.      Marland Kitchen gabapentin (NEURONTIN) 600 MG tablet Take 600 mg by mouth at bedtime.       Marland Kitchen HUMALOG KWIKPEN 100 UNIT/ML SOPN Inject 5 Units into the skin 2 (two) times daily with a meal.       . hydrALAZINE (APRESOLINE) 50 MG tablet Take 50 mg by mouth 3 (three) times daily as needed (for high blood pressure). Does not take dose if BP low      . HYDROcodone-acetaminophen (NORCO/VICODIN) 5-325 MG per tablet Take 1 tablet by mouth every evening.       . insulin glargine (LANTUS) 100 UNIT/ML injection Inject 44 Units into the skin every morning.       . insulin glargine (LANTUS) 100 UNIT/ML injection Inject 4-16 Units into the skin at bedtime as needed (For blood sugar = or >165).      Marland Kitchen loperamide (IMODIUM) 2 MG capsule Take 2 mg by mouth as needed for diarrhea or loose stools.      Marland Kitchen losartan (COZAAR) 100 MG tablet Take 100 mg by mouth 2 (two) times daily.       . Magnesium Oxide 250 MG TABS Take 1 tablet by mouth 2 (two) times daily.      . metoprolol (LOPRESSOR) 50 MG tablet 25 mg 2 (two) times daily.       . Multiple Vitamin (MULTIVITAMIN) tablet Take 1 tablet by mouth daily.        . nitroGLYCERIN (NITROSTAT) 0.3 MG SL tablet Place 0.3 mg under the tongue every 5 (five) minutes x 3 doses as needed for chest pain. Last taken on 11/02/12 (took 2 doses and chest pain resolved)      . omeprazole (PRILOSEC) 20 MG capsule 20 mg daily.       . ondansetron (ZOFRAN) 4 MG tablet Take 1 tablet (4 mg total) by mouth 2 (two) times daily as needed. For nausea  20 tablet  0  . oxyCODONE-acetaminophen (PERCOCET/ROXICET) 5-325 MG per tablet Take 1 tablet by mouth every 4 (four) hours as needed for severe pain.  20 tablet  0  . ranitidine (ZANTAC) 150 MG tablet Take 1 tablet (150 mg total) by  mouth 2 (two) times daily.      . ranolazine (RANEXA) 1000 MG SR tablet Take 1 tablet (1,000 mg  total) by mouth 2 (two) times daily.  60 tablet  6  . rosuvastatin (CRESTOR) 40 MG tablet Take 40 mg by mouth daily.       Marland Kitchen topiramate (TOPAMAX) 50 MG tablet Take 1 tablet (50 mg total) by mouth 2 (two) times daily.  60 tablet  6  . triamterene-hydrochlorothiazide (DYAZIDE) 37.5-25 MG per capsule Take 1 capsule by mouth every morning.       . vitamin B-12 (CYANOCOBALAMIN) 1000 MCG tablet Take 1,000 mcg by mouth daily.      Marland Kitchen zinc gluconate 50 MG tablet Take 50 mg by mouth daily.      Marland Kitchen zolpidem (AMBIEN) 10 MG tablet Take 10 mg by mouth at bedtime as needed for sleep.        No current facility-administered medications for this visit.    Allergies:    Allergies  Allergen Reactions  . Amoxicillin Shortness Of Breath and Rash  . Erythromycin Shortness Of Breath and Other (See Comments)    Trouble swallowing  . Penicillins Shortness Of Breath and Rash  . Metformin And Related     dizziness and nausea  . Tape Other (See Comments)    Plastic tape causes skin to rip if left on for long periods of time  . Isosorbide Mononitrate [Isosorbide]     Joint aches, muscles hurt, difficult to walk    Social History:  The patient  reports that she has never smoked. She has never used smokeless tobacco. She reports that she does not drink alcohol or use illicit drugs.   Family History:  The patient's family history includes Cancer in her sister; Deep vein thrombosis in her father; Diabetes in her father and another family member; Heart attack in her father; Heart disease in her father; Hyperlipidemia in her father; Hypertension in her father and mother.   ROS:  Please see the history of present illness.  No nausea, vomiting.  No fevers, chills.  No focal weakness.  No dysuria.   All other systems reviewed and negative.   PHYSICAL EXAM: VS:  BP 127/63  Pulse 50  Ht 5\' 2"  (1.575 m)  Wt 280 lb (127.007 kg)  BMI 51.20 kg/m2 Well nourished, well developed, in no acute distress HEENT: normal Neck: no  JVD, no carotid bruits Cardiac:  normal S1, S2; RRR;  Lungs:  clear to auscultation bilaterally, no wheezing, rhonchi or rales Abd: soft, nontender, no hepatomegaly, Obese Ext: no edema Skin: warm and dry Neuro:   no focal abnormalities noted       ASSESSMENT AND PLAN:  1. CAD/exertional angina: I reviewed her catheterization films in detail with Dr. Algernon Huxley and with Dr. [place]. The Challenge with her occluded RCA is that it is very close to the ostium where the occlusion begins.  Will attempt chronic total occlusion revascularization. Will likely have to try with a stiff/slick wire to see if we can get through the proximal. Perhaps a cross boss will be needed, but this will be difficult to deliver due to the fact that we may not have much purchase in the proximal vessel. All of this was explained to the patient including the additional risks of chronic total occlusion revascularization including, dissection, perforation, bleeding, stroke among other risks. All questions were answered. She is willing to proceed due to the severity of her symptoms. Continue metoprolol, ranolazine and amlodipine  for antianginal therapy. She has issues with headaches so isosorbide has not been used. 2. Of note, we will continue her Plavix given her recent carotid stent. I believe, she would need to be on the Plavix for at least a month prior to attempting intervention. Unclear exactly how long her RCA has been completely occluded.  It has been years for sure given the prior cath reports. I explained to her that the longer the artery has been occluded, the lower the chance of success. She is aware of this. Despite it being described as a small vessel in prior reports, the proximal vessel does look sizable. We'll have to see how the rest of the vessel does with revascularization.  Signed, Mina Marble, MD, [facility] 04/09/2013 3:28 PM

## 2013-04-25 DIAGNOSIS — I509 Heart failure, unspecified: Secondary | ICD-10-CM

## 2013-04-25 DIAGNOSIS — I209 Angina pectoris, unspecified: Secondary | ICD-10-CM

## 2013-04-25 DIAGNOSIS — I2510 Atherosclerotic heart disease of native coronary artery without angina pectoris: Secondary | ICD-10-CM

## 2013-04-25 DIAGNOSIS — R0609 Other forms of dyspnea: Secondary | ICD-10-CM

## 2013-04-25 DIAGNOSIS — I5032 Chronic diastolic (congestive) heart failure: Secondary | ICD-10-CM

## 2013-04-25 DIAGNOSIS — R0989 Other specified symptoms and signs involving the circulatory and respiratory systems: Secondary | ICD-10-CM

## 2013-04-25 LAB — CBC
HCT: 35 % — ABNORMAL LOW (ref 36.0–46.0)
Hemoglobin: 11.7 g/dL — ABNORMAL LOW (ref 12.0–15.0)
MCH: 32.2 pg (ref 26.0–34.0)
MCHC: 33.4 g/dL (ref 30.0–36.0)
MCV: 96.4 fL (ref 78.0–100.0)
Platelets: 196 10*3/uL (ref 150–400)
RBC: 3.63 MIL/uL — ABNORMAL LOW (ref 3.87–5.11)
RDW: 13.1 % (ref 11.5–15.5)
WBC: 8.1 10*3/uL (ref 4.0–10.5)

## 2013-04-25 LAB — GLUCOSE, CAPILLARY: Glucose-Capillary: 181 mg/dL — ABNORMAL HIGH (ref 70–99)

## 2013-04-25 LAB — BASIC METABOLIC PANEL
BUN: 18 mg/dL (ref 6–23)
CO2: 28 mEq/L (ref 19–32)
Calcium: 8.5 mg/dL (ref 8.4–10.5)
Chloride: 102 mEq/L (ref 96–112)
Creatinine, Ser: 0.93 mg/dL (ref 0.50–1.10)
GFR calc Af Amer: 73 mL/min — ABNORMAL LOW (ref >90)
GFR calc non Af Amer: 63 mL/min — ABNORMAL LOW (ref >90)
Glucose, Bld: 203 mg/dL — ABNORMAL HIGH (ref 70–99)
Potassium: 4.5 mEq/L (ref 3.7–5.3)
Sodium: 140 mEq/L (ref 137–147)

## 2013-04-25 MED ORDER — FUROSEMIDE 10 MG/ML IJ SOLN: 40.0000 mg | Freq: Once | INTRAMUSCULAR | Status: AC

## 2013-04-25 MED ORDER — FUROSEMIDE 10 MG/ML IJ SOLN
INTRAMUSCULAR | Status: AC
  Administered 2013-04-25: 40 mg
  Filled 2013-04-25: qty 4

## 2013-04-25 MED ORDER — POTASSIUM CHLORIDE ER 10 MEQ PO TBCR: 20.0000 meq | EXTENDED_RELEASE_TABLET | Freq: Every day | ORAL | Status: DC | PRN

## 2013-04-25 NOTE — Discharge Instructions (Addendum)
Increase Lasix to twice daily for two more days.  Continue to monitor weight and low sodium diet.   Coronary Angiography With Stent, Care After Refer to this sheet in the next few weeks. These instructions provide you with information on caring for yourself after your procedure. Your health care provider may also give you more specific instructions. Your treatment has been planned according to current medical practices, but problems sometimes occur. Call your health care provider if you have any problems or questions after your procedure.  WHAT TO EXPECT AFTER THE PROCEDURE  The insertion site may be tender for a few days after your procedure. HOME CARE INSTRUCTIONS   Only take over-the-counter or prescription medicines as directed by your health care provider. Take all medicines exactly as instructed. Blood thinners may be prescribed after your procedure to improve blood flow through the stent.  Change any bandages (dressings) as directed by your health care provider.   Check your insertion site every day for redness, swelling, or fluid leaking from the insertion.   You may take showers. Pat the insertion area dry. Do not rub the insertion area with a washcloth or towel.   Eat a heart-healthy diet. This should include plenty of fresh fruits and vegetables. Meat should be lean cuts. Avoid the following types of food:   Food that is high in salt.   Canned or highly processed food.   Food that is high in saturated fat or sugar.   Fried food.   Make any other lifestyle changes recommended by your health care provider. This may include:   Quitting smoking.   Managing your weight.   Getting regular exercise.   Managing your blood pressure.   Limiting your alcohol intake.   Managing other health problems, such as diabetes.   If you need an MRI after your heart stent was placed, be sure to tell the health care provider who orders the MRI that you have a heart stent.    Follow up with your health care provider as directed.  SEEK IMMEDIATE MEDICAL CARE IF:   You develop chest pain, shortness of breath, feel faint, or pass out.  You have bleeding, swelling larger than a walnut, or drainage from the catheter insertion site.  You develop pain, discoloration, coldness, or severe bruising in the leg or arm that held the catheter.  You develop bleeding from any other place such as from the bowels. There may be bright red blood in the urine or stools, or it may appear as black, tarry stools.  You have a fever or chills. MAKE SURE YOU:  Understand these instructions.  Will watch your condition.  Will get help right away if you are not doing well or get worse. Document Released: 08/07/2004 Document Revised: 09/20/2012 Document Reviewed: 06/21/2012 [facility] Patient Information 2014 [place].  Potassium Salts tablets, extended-release tablets or capsules What is this medicine? POTASSIUM (poe TASS i um) is a natural salt that is important for the heart, muscles, and nerves. It is found in many foods and is normally supplied by a well balanced diet. This medicine is used to treat low potassium. This medicine may be used for other purposes; ask your health care provider or pharmacist if you have questions. COMMON BRAND NAME(S): ED-K+10, Glu-K, K-10 , K-8, K-Dur, K-Tab, Kaon-CL, Klor-Con M10, Klor-Con M15, Klor-Con M20, Klor-Con, Klotrix, Micro-K Extencaps, Micro-K, Slow-K What should I tell my health care provider before I take this medicine? They need to know if you have any  of these conditions: -dehydration -diabetes -irregular heartbeat -kidney disease -stomach ulcers or other stomach problems -an unusual or allergic reaction to potassium salts, other medicines, foods, dyes, or preservatives -pregnant or trying to get pregnant -breast-feeding How should I use this medicine? Take this medicine by mouth with a full glass of water. Follow the  directions on the prescription label. Take with food. Do not suck on, crush, or chew this medicine. If you have difficulty swallowing, ask the pharmacist how to take. Take your medicine at regular intervals. Do not take it more often than directed. Do not stop taking except on your doctor's advice. Talk to your pediatrician regarding the use of this medicine in children. Special care may be needed. Overdosage: If you think you have taken too much of this medicine contact a poison control center or emergency room at once. NOTE: This medicine is only for you. Do not share this medicine with others. What if I miss a dose? If you miss a dose, take it as soon as you can. If it is almost time for your next dose, take only that dose. Do not take double or extra doses. What may interact with this medicine? Do not take this medicine with any of the following medications: -eplerenone -sodium polystyrene sulfonate This medicine may also interact with the following medications: -medicines for blood pressure or heart disease like lisinopril, losartan, quinapril, valsartan -medicines for cold or allergies -medicines for inflammation like ibuprofen, indomethacin -medicines for Parkinson's disease -medicines for the stomach like metoclopramide, dicyclomine, glycopyrrolate -some diuretics This list may not describe all possible interactions. Give your health care provider a list of all the medicines, herbs, non-prescription drugs, or dietary supplements you use. Also tell them if you smoke, drink alcohol, or use illegal drugs. Some items may interact with your medicine. What should I watch for while using this medicine? Visit your doctor or health care professional for regular check ups. You will need lab work done regularly. You may need to be on a special diet while taking this medicine. Ask your doctor. What side effects may I notice from receiving this medicine? Side effects that you should report to your  doctor or health care professional as soon as possible: -allergic reactions like skin rash, itching or hives, swelling of the face, lips, or tongue -black, tarry stools -heartburn -irregular heartbeat -numbness or tingling in hands or feet -pain when swallowing -unusually weak or tired Side effects that usually do not require medical attention (report to your doctor or health care professional if they continue or are bothersome): -diarrhea -nausea -stomach gas -vomiting This list may not describe all possible side effects. Call your doctor for medical advice about side effects. You may report side effects to FDA at 1-800-FDA-1088. Where should I keep my medicine? Keep out of the reach of children. Store at room temperature between 15 and 30 degrees C (59 and 86 degrees F ). Keep bottle closed tightly to protect this medicine from light and moisture. Throw away any unused medicine after the expiration date. NOTE: This sheet is a summary. It may not cover all possible information. If you have questions about this medicine, talk to your doctor, pharmacist, or health care provider.  2014, [employer] Standard. (2007-04-05 11:17:31)

## 2013-04-25 NOTE — Progress Notes (Signed)
Subjective: Some orthopnea/LEE.  She reports a six pound wt gain in two days a couple weeks ago. Right groin a little sore.    Objective: Vital signs in last 24 hours: Temp:  [97.6 F (36.4 C)-98.1 F (36.7 C)] 97.8 F (36.6 C) (03/25 0611) Pulse Rate:  [55-66] 62 (03/25 0611) Resp:  [16-20] 20 (03/25 0611) BP: (107-154)/(20-84) 143/78 mmHg (03/25 0611) SpO2:  [92 %-98 %] 96 % (03/25 0611) Weight:  [280 lb 6.8 oz (127.2 kg)] 280 lb 6.8 oz (127.2 kg) (03/24 2351) Last BM Date: 04/23/13  Intake/Output from previous day: 03/24 0701 - 03/25 0700 In: [id number] [P.O.:720; I.V.:725] Out: 1100 [Urine:1100] Intake/Output this shift: Total I/O In: 480 [P.O.:480] Out: 700 [Urine:700]  Medications Current Facility-Administered Medications  Medication Dose Route Frequency Provider Last Rate Last Dose  . 0.9 %  sodium chloride infusion   Intravenous Continuous Casandra Doffing, MD 100 mL/hr at 04/24/13 2118    . albuterol (PROVENTIL) (2.5 MG/3ML) 0.083% nebulizer solution 2.5 mg  2.5 mg Nebulization Q6H PRN Casandra Doffing, MD      . ALPRAZolam Duanne Moron) tablet 0.5 mg  0.5 mg Oral TID PRN Casandra Doffing, MD      . amLODipine (NORVASC) tablet 10 mg  10 mg Oral Daily Casandra Doffing, MD      . aspirin chewable tablet 81 mg  81 mg Oral Daily Casandra Doffing, MD      . cholestyramine Lucrezia Starch) packet 4 g  4 g Oral BID Casandra Doffing, MD   4 g at 04/24/13 2127  . cloNIDine (CATAPRES) tablet 0.2 mg  0.2 mg Oral BID Casandra Doffing, MD   0.2 mg at 04/24/13 2127  . clopidogrel (PLAVIX) tablet 75 mg  75 mg Oral Q breakfast Casandra Doffing, MD      . ezetimibe (ZETIA) tablet 10 mg  10 mg Oral Daily Casandra Doffing, MD   10 mg at 04/24/13 1940  . famotidine (PEPCID) tablet 10 mg  10 mg Oral QHS Casandra Doffing, MD   10 mg at 04/24/13 2127  . FLUoxetine (PROZAC) capsule 20 mg  20 mg Oral Daily Casandra Doffing, MD      . gabapentin (NEURONTIN) tablet 600 mg  600 mg Oral QHS Casandra Doffing, MD   600 mg at 04/24/13 2126  . hydrALAZINE  (APRESOLINE) tablet 50 mg  50 mg Oral TID PRN Casandra Doffing, MD      . HYDROcodone-acetaminophen (NORCO/VICODIN) 5-325 MG per tablet 1 tablet  1 tablet Oral QPM Casandra Doffing, MD   1 tablet at 04/24/13 1935  . insulin aspart (novoLOG) injection 5 Units  5 Units Subcutaneous BID WC Casandra Doffing, MD   5 Units at 04/24/13 2122  . insulin glargine (LANTUS) injection 4-16 Units  4-16 Units Subcutaneous QHS PRN Casandra Doffing, MD      . insulin glargine (LANTUS) injection 44 Units  44 Units Subcutaneous q morning - 10a Casandra Doffing, MD      . loperamide (IMODIUM) capsule 2 mg  2 mg Oral PRN Casandra Doffing, MD      . losartan (COZAAR) tablet 100 mg  100 mg Oral Daily Casandra Doffing, MD      . metoprolol tartrate (LOPRESSOR) tablet 25 mg  25 mg Oral BID Casandra Doffing, MD   25 mg at 04/24/13 2131  . ondansetron (ZOFRAN) tablet 4 mg  4 mg Oral BID PRN Casandra Doffing, MD      . oxyCODONE-acetaminophen (PERCOCET/ROXICET) 5-325 MG per tablet 1 tablet  1 tablet Oral Q4H PRN Casandra Doffing, MD      . pantoprazole (PROTONIX) EC tablet 40 mg  40 mg Oral Daily Casandra Doffing, MD      . ranolazine [facility]) 12 hr tablet 1,000 mg  1,000 mg Oral BID Casandra Doffing, MD   1,000 mg at 04/24/13 2127  . rosuvastatin (CRESTOR) tablet 40 mg  40 mg Oral q1800 Casandra Doffing, MD   40 mg at 04/24/13 1940  . topiramate (TOPAMAX) tablet 50 mg  50 mg Oral BID Casandra Doffing, MD   50 mg at 04/24/13 2127  . vitamin B-12 (CYANOCOBALAMIN) tablet 1,000 mcg  1,000 mcg Oral Daily Casandra Doffing, MD   1,000 mcg at 04/24/13 1940  . zolpidem (AMBIEN) tablet 5 mg  5 mg Oral QHS PRN Casandra Doffing, MD        PE: General appearance: alert, cooperative and no distress Lungs: clear to auscultation bilaterally Heart: regular rate and rhythm and 1/6 sys MM RSB Extremities: 1+ LEEE Pulses: 2+ and symmetric Skin: Warm and dry.  right groin mildly tender.  The left is not.  No hematoma in either.   Neurologic: Grossly normal  Lab Results:   Recent Labs   04/25/13 0300  WBC 8.1  HGB 11.7*  HCT 35.0*  PLT 196   BMET  Recent Labs  04/25/13 0300  NA 140  K 4.5  CL 102  CO2 28  GLUCOSE 203*  BUN 18  CREATININE 0.93  CALCIUM 8.5    Assessment/Plan  Principal Problem:   Other and unspecified angina pectoris Active Problems:   DM   HYPERLIPIDEMIA   HYPERTENSION   CORONARY ARTERY DISEASE  Plan: SP successful cutting balloon angioplasty of the proximal RCA occlusion. This was reduced to a 25% stenosis. We were unable to cross the disease in the distal RCA into the distal stent due to heavy diffuse disease.  Plan is to bring her back in ~4 weeks to finish the RCA distal occlusion.   BP and HR stable.  ASA, amlodipine, plavix, clonidine, Zetia, lopressor, ranexa, Crestor, losartan.  LEE-will plan to double up lasix for a few days.  SCr WNL.  Carotid dopplers: 40% right ICA stenosis and patent left ICA stent.  DC home today.  Glucose controlled. BP stable.    LOS: 1 day    HAGER, BRYAN PA-C 04/25/2013 6:43 AM  I have examined the patient and reviewed assessment and plan and discussed with patient.  Agree with above as stated.  Will give dose of IV lasix to help with fluid overload, SHOB.  Walked with rehab, and had some angina/SHOB with walking that was relieved with rest. OK to send home today.  Keghan Mcfarren S.

## 2013-04-25 NOTE — Progress Notes (Signed)
CARDIAC REHAB PHASE I   PRE:  Rate/Rhythm: 65 SR  BP:  Supine: 126/57  Sitting:   Standing:    SaO2:   MODE:  Ambulation: 250 ft   POST:  Rate/Rhythm: 89  BP:  Supine:   Sitting: 172/53  Standing:    SaO2:  0750-0836 Pt walked 250 ft with hand held asst. Gait fairly steady. Had to hold to side rail at times. Legs weak. Pt c/o chest tightness 5 on scale 1-10 after walk. This was relieved with rest. Pt did not feel she needed to take a NTG tablet. Education completed. Pt feels comfortable with diet as she has met one on one with Cardiac Rehab diettian and HGA1C has improved to 6.6 per pt. Pt knows how to take NTG . Did not give ex ed as pt had chest tightness with short walk. Encouraged her to walk as tolerated stopping if chest tightness occurs. Notified Dr. Irish Lack of chest tightness. Will address CRP 2 when pt returns for staged PCI.   Graylon Good, RN BSN  04/25/2013 8:31 AM

## 2013-04-25 NOTE — Discharge Summary (Addendum)
Physician Discharge Summary     Patient ID: [name] [name] [name]: [id number] DOB/AGE: [birth year] 66 y.o. Cardiologist: McLean/Bunny Lowdermilk  Admit date: 04/24/2013 Discharge date: 04/25/2013  Admission Diagnoses:   Other and unspecified angina pectoris  Discharge Diagnoses:  Principal Problem:   Other and unspecified angina pectoris Active Problems:   DM   HYPERLIPIDEMIA   HYPERTENSION   CORONARY ARTERY DISEASE   Discharged Condition: stable  Hospital Course:   [name] [name] is a 68 y.o. female with a history of extensive coronary artery disease including coronary artery bypass grafting several years ago. Over the past 6 months, she has developed more exertional angina. Repeat cardiac catheterization revealed occluded SVG to RCA. The native RCA has 2 occluded bare-metal stents.  She presented for left heart cath and underwent successful cutting balloon angioplasty of the proximal RCA occlusion.  This was reduced to a 25% stenosis. We were unable to cross the disease in the distal RCA into the distal stent due to heavy diffuse disease.   IVUS reveals that we were intraluminal in the proximal to the beginning portion of the mid RCA. A wire reentered a distal RV marginal branch which is clearly intraluminal.  She remained overnight for observation.  She reported a 6# weight gain over a two day period about two weeks ago.  On exam she had some rales.  IV lasix was given prior to discharge and will increase PO dosing to BID for two more days.  She is on dual antiplatelet therapy.  The patient was seen by Dr. Irish Lack who felt she was stable for DC home.  Follow up was arranged.  Will consider another attempt at intervention in about four weeks.     Consults: None  Significant Diagnostic Studies:   Left heart cath and PCI PROCEDURE TECHNIQUE: After Xylocaine anesthesia a 23F sheath was placed in the right femoral artery with a single anterior needle wall stick. After a seizure, a 5 French sheath  was placed into the left femoral artery using a single anterior needle wall stick. Intravenous heparin was given. Multiple ACTs were used to check that the heparin was therapeutic. Left coronary angiography was done using a Judkins L4 guide catheter. Right coronary angiography was initially started using a Judkins R4 guide catheter. We then switched to an AL-1 guide. We then switched to an ART 3.5 guide and performed the remainder of the procedure.  CONTRAST: Total of 215 cc given.  COMPLICATIONS: None.  ANGIOGRAPHIC DATA:  The right coronary artery is occluded just past the ostium. There are left to right collaterals. There is a proximal stent which is approximately 30 mm an occluded. In the distal RCA, there is a distal stent which is heavily diseased. The entire mid RCA is heavily diseased noted from collaterals from the left system.  PCI narrative: Initially, the JR 4 guiding catheter was used to engage the right coronary artery. A 5 [place] guide catheter was placed into the left main. A Fielder wire was advanced across the proximal Into the mid portion of the proximal stent. The [facility] wire would not cross the entire occlusion of the stented segment. We then tried to advance a 2.0 x 12 over the wire balloon but there was not enough support to get this across the proximal calf. We switched out guides to an AL-1. We tried getting a balloon as well as a cross boss into the proximal cap, but the support was inadequate. We then switched to the ART. Guide. With  a Confianza Pro 12 wire, we were able to cross the proximal cath into the more distal portion of the stent. We're able to get a 1.25 x 20 sprinter legend balloon into the area of occlusion and perform several dilatations. We then switched out for an over-the-wire 1.5 x 15 Maverick balloon and did multiple inflations of the stented segment. We switched out the Confianza Pro 12 wire for a miracle Brother 6 g wire. This appeared to be in the true lumen  just past the stent. We advanced our balloon further down and tried to advance a BMW wire into the right coronary artery. We are unable to wire this into the distal vessel. We tried with a pro-water wire as well as a PT 2. Despite multiple attempts, this was unsuccessful in getting all the way to the distal RCA stent. The PT 2 did now head the distal RCA but would not cross the stent. Multiple times were made with a prolapsed wire. We also tried with a miracle Brothers 6 g wire were unsuccessful. It was clear that we were wiring into an RV marginal which made [place] think we were intraluminal at some point. Multiple balloon inflations with a 2.25 emerge balloon were performed in the stented segment. We then advanced an IVUS catheter to the mid RCA. Upon pullback, was noted that we were luminal just past the stent. After the IVUS we then advanced the emerge balloon just beyond the distal edge of the stent. Several balloon inflations were performed. A cutting balloon was then advanced, 2.75 x 15. This is inflated several times within the stented segment. Angiogram was performed and this revealed normal flow into several marginal vessels. There was dissection noted in the distal RCA. The collaterals were still intact. There appeared to be in antegrade channel into the distal RCA as well.  IMPRESSIONS:  1. Successful cutting balloon angioplasty of the proximal RCA occlusion. This was reduced to a 25% stenosis. We were unable to cross the disease in the distal RCA into the distal stent due to heavy diffuse disease.  2. IVUS reveals that we were intraluminal in the proximal to the beginning portion of the mid RCA. A wire reentered a distal RV marginal branch which is clearly intraluminal.  RECOMMENDATION: Watch overnight. Hopefully, after a few weeks of time, the dissection in the mid to distal RCA will heal and there will be a track for [place] to complete revascularization of the distal occlusion. Continue dual antiplatelet  therapy. Continue aggressive medical therapy.     Treatments: See above  Discharge Exam: Blood pressure 126/57, pulse 67, temperature 97.9 F (36.6 C), temperature source Oral, resp. rate 20, height 5' 2.5" (1.588 m), weight 280 lb 6.8 oz (127.2 kg), SpO2 96.00%.   Disposition: 01-Home or Self Care      Discharge Orders   Future Appointments Provider Department Dept Phone   05/09/2013 8:30 AM Sharmon Revere [place] Office [phone]   05/10/2013 8:20 AM [facility] [place] Office [phone]   06/07/2013 1:45 PM Larey Dresser, MD [place] Office [phone]   07/11/2013 10:30 AM Dennie Bible, NP [facility] [phone]   10/29/2013 10:30 AM [facility] [place] [facility] [address] [id number]   10/29/2013 11:30 AM [name], MD [facility] [phone]   Future Orders Complete By Expires   Diet - low sodium heart healthy  As directed    Discharge  instructions  As directed    Comments:     No lifting more than a half gallon of milk or driving for three days.  Take your lasix twice daily for two more days, then go back to once daily.   Increase activity slowly  As directed        Medication List         ADVAIR DISKUS 250-50 MCG/DOSE Aepb  Generic drug:  Fluticasone-Salmeterol  Inhale 1 puff into the lungs every morning. For asthma symptoms     albuterol (2.5 MG/3ML) 0.083% nebulizer solution  Commonly known as:  PROVENTIL  Take 2.5 mg by nebulization every 6 (six) hours as needed for shortness of breath (when asthma is active). If severe asthma , use q4h around the clock x10 days.  For shortness of breath.     albuterol 90 MCG/ACT inhaler  Commonly known as:  PROVENTIL,VENTOLIN  Inhale 2 puffs into the lungs every 6 (six) hours as needed. For shortness of breath.     ALPRAZolam 0.5 MG tablet  Commonly known as:  XANAX  Take 0.5 mg by mouth  See admin instructions. For anxiety - Patient takes 1 in the morning and 1 in the evening daily. If needed she may take one in the afternoon.     AMBIEN 10 MG tablet  Generic drug:  zolpidem  Take 10 mg by mouth at bedtime as needed for sleep.     amLODipine 10 MG tablet  Commonly known as:  NORVASC  Take 1 tablet (10 mg total) by mouth daily.     aspirin EC 81 MG tablet  Take 81 mg by mouth daily.     B-complex with vitamin C tablet  Take 1 tablet by mouth daily.     calcium carbonate 200 MG capsule  Take 1,000 mg by mouth daily with breakfast.     cholestyramine 4 GM/DOSE powder  Commonly known as:  QUESTRAN  2 scoops two times a day (mixed in juice or light kool-aid).     cloNIDine 0.2 MG tablet  Commonly known as:  CATAPRES  Take 0.2 mg by mouth 2 (two) times daily.     clopidogrel 75 MG tablet  Commonly known as:  PLAVIX  Take 1 tablet (75 mg total) by mouth daily.     ezetimibe 10 MG tablet  Commonly known as:  ZETIA  Take 1 tablet (10 mg total) by mouth daily.     FLUoxetine 20 MG capsule  Commonly known as:  PROZAC  Take 20 mg by mouth daily.     furosemide 40 MG tablet  Commonly known as:  LASIX  Take 40 mg by mouth daily.     gabapentin 600 MG tablet  Commonly known as:  NEURONTIN  Take 600 mg by mouth at bedtime.     HUMALOG KWIKPEN 100 UNIT/ML KiwkPen  Generic drug:  insulin lispro  Inject 5 Units into the skin 2 (two) times daily with a meal.     hydrALAZINE 50 MG tablet  Commonly known as:  APRESOLINE  Take 50 mg by mouth 3 (three) times daily as needed (for high blood pressure). If BP is >175     HYDROcodone-acetaminophen 5-325 MG per tablet  Commonly known as:  NORCO/VICODIN  Take 1 tablet by mouth every evening.     insulin glargine 100 UNIT/ML injection  Commonly known as:  LANTUS  Inject 44 Units into the skin every morning.     insulin glargine 100 UNIT/ML  injection  Commonly known as:  LANTUS  Inject 4-16 Units into the skin at  bedtime as needed (For blood sugar = or >165).     loperamide 2 MG capsule  Commonly known as:  IMODIUM  Take 2 mg by mouth as needed for diarrhea or loose stools.     losartan 100 MG tablet  Commonly known as:  COZAAR  Take 100 mg by mouth 2 (two) times daily.     Magnesium Oxide 250 MG Tabs  Take 250 mg by mouth 2 (two) times daily.     metoprolol 50 MG tablet  Commonly known as:  LOPRESSOR  Take 25 mg by mouth 2 (two) times daily.     multivitamin tablet  Take 1 tablet by mouth daily.     nitroGLYCERIN 0.3 MG SL tablet  Commonly known as:  NITROSTAT  Place 0.3 mg under the tongue every 5 (five) minutes x 3 doses as needed for chest pain. Last taken on 11/02/12 (took 2 doses and chest pain resolved)     omeprazole 20 MG capsule  Commonly known as:  PRILOSEC  Take 20 mg by mouth daily.     ondansetron 4 MG tablet  Commonly known as:  ZOFRAN  Take 1 tablet (4 mg total) by mouth 2 (two) times daily as needed. For nausea     oxyCODONE-acetaminophen 5-325 MG per tablet  Commonly known as:  PERCOCET/ROXICET  Take 1 tablet by mouth every 4 (four) hours as needed for severe pain.     ranitidine 150 MG tablet  Commonly known as:  ZANTAC  Take 1 tablet (150 mg total) by mouth 2 (two) times daily.     ranolazine 1000 MG SR tablet  Commonly known as:  RANEXA  Take 1 tablet (1,000 mg total) by mouth 2 (two) times daily.     rosuvastatin 40 MG tablet  Commonly known as:  CRESTOR  Take 40 mg by mouth daily.     topiramate 50 MG tablet  Commonly known as:  TOPAMAX  Take 1 tablet (50 mg total) by mouth 2 (two) times daily.     triamterene-hydrochlorothiazide 37.5-25 MG per capsule  Commonly known as:  DYAZIDE  Take 1 capsule by mouth every morning.     vitamin B-12 1000 MCG tablet  Commonly known as:  CYANOCOBALAMIN  Take 1,000 mcg by mouth daily.     Vitamin D 2000 UNITS tablet  Take 2,000 Units by mouth daily.     zinc gluconate 50 MG tablet  Take 50 mg by mouth  daily.       Follow-up Information   Follow up with Richardson Dopp, PA-C On 05/09/2013. (8:30AM)    Specialty:  Physician Assistant   Contact information:   [address] [place] [address] [phone]       Signed: Tarri Fuller 04/25/2013, 9:53 AM   I have examined the patient and reviewed assessment and plan and discussed with patient.  Agree with above as stated.  Significnat diuresis with IV lasix. Plan repeat cath in a few weeks.  Diastolic dysfunction.  Groins stable. 40 minutes spent on this d/c including MD time going over the plan.  Donavyn Fecher S.

## 2013-05-03 ENCOUNTER — Encounter: Payer: Self-pay | Admitting: Cardiology

## 2013-05-09 ENCOUNTER — Other Ambulatory Visit: Payer: Medicare Other

## 2013-05-09 ENCOUNTER — Encounter: Payer: Self-pay | Admitting: Physician Assistant

## 2013-05-09 ENCOUNTER — Ambulatory Visit (INDEPENDENT_AMBULATORY_CARE_PROVIDER_SITE_OTHER): Payer: Medicare Other | Admitting: Physician Assistant

## 2013-05-09 ENCOUNTER — Encounter: Payer: Self-pay | Admitting: *Deleted

## 2013-05-09 VITALS — BP 144/86 | HR 71

## 2013-05-09 DIAGNOSIS — I10 Essential (primary) hypertension: Secondary | ICD-10-CM

## 2013-05-09 DIAGNOSIS — I2510 Atherosclerotic heart disease of native coronary artery without angina pectoris: Secondary | ICD-10-CM

## 2013-05-09 DIAGNOSIS — I209 Angina pectoris, unspecified: Secondary | ICD-10-CM

## 2013-05-09 DIAGNOSIS — I6529 Occlusion and stenosis of unspecified carotid artery: Secondary | ICD-10-CM

## 2013-05-09 DIAGNOSIS — I509 Heart failure, unspecified: Secondary | ICD-10-CM

## 2013-05-09 DIAGNOSIS — I5032 Chronic diastolic (congestive) heart failure: Secondary | ICD-10-CM

## 2013-05-09 DIAGNOSIS — E785 Hyperlipidemia, unspecified: Secondary | ICD-10-CM

## 2013-05-09 LAB — LIPID PANEL
Cholesterol: 197 mg/dL (ref 0–200)
HDL: 42.4 mg/dL (ref >39.00)
LDL Cholesterol: 127 mg/dL — ABNORMAL HIGH (ref 0–99)
Total CHOL/HDL Ratio: 5
Triglycerides: 140 mg/dL (ref 0.0–149.0)
VLDL: 28 mg/dL (ref 0.0–40.0)

## 2013-05-09 LAB — HEPATIC FUNCTION PANEL
ALT: 18 U/L (ref 0–35)
AST: 20 U/L (ref 0–37)
Albumin: 3.7 g/dL (ref 3.5–5.2)
Alkaline Phosphatase: 64 U/L (ref 39–117)
Bilirubin, Direct: 0 mg/dL (ref 0.0–0.3)
Total Bilirubin: 0.9 mg/dL (ref 0.3–1.2)
Total Protein: 7.1 g/dL (ref 6.0–8.3)

## 2013-05-09 MED ORDER — ISOSORBIDE MONONITRATE ER 30 MG PO TB24: 15.0000 mg | ORAL_TABLET | Freq: Every day | ORAL | Status: DC

## 2013-05-09 NOTE — Progress Notes (Signed)
[facility] [address], [place]  [address] Phone: [phone] Fax:  [phone]  Date:  05/09/2013   ID:  [name] [name], DOB [birth year], [name] [id number]  PCP:  Marylene Land, MD  Cardiologist:  Dr. Loralie Champagne     History of Present Illness: [name] [name] is a 68 y.o. female with a hx of CAD s/p CABG, diastolic CHF, and cerebrovascular disease with history of CVA.  She had CABG x 5 in 12/11. Prior to the CABG she had multiple PCIs. She had a CVA in 2/14 that presented as visual loss. She ended up getting a cerebral angiogram in 6/14 with 70% ostial right vertebral stenosis, > 70% stenosis proximal left posterior cerebral artery, and 123456 LICA stenosis. She has been on ASA and Plavix. She recently saw Dr. Scot Dock, and carotid dopplers suggested worsening of LICA stenosis to > [id number]. She has had episodes of transient expressive aphasia for several months. She ultimately underwent L carotid stent by Dr. Trula Slade on 03/20/13.    Given dyspnea and chest pain that was worsened from her baseline, she underwent left heart catheterization in 9/14. This showed patent SVG-Dx, LIMA-LAD, and sequential SVG-OM branches but SVG-RCA and the native RCA were both totally occluded. It was suspect that her increased symptoms coincided with occlusion of SVG-RCA. She was started on Imdur to see if this would help with the chest pain and dyspnea. However, she felt like Imdur caused leg cramps and did not think that she can take it. She was then started on ranolazine 500 mg bid and titrated this up to 1000 mg bid. This helped the exertional chest pressure though it did not resolve it. There was some concern at rehab for bradycardia, so metoprolol has been cut back to 50 mg bid.   She was ultimately referred to Dr. Irish Lack for CTO PCI of the RCA.  She underwent successful cutting balloon angioplasty of prox RCA occlusion (reduced to 25%).  Distal RCA disease could not be crossed due to heavy diffuse disease.   There was dissection noted in the distal RCA.  It was felt that after the dissection healed in the mid to dist RCA, there may be a track to complete revascularization of the distal occlusion.  repeat attempt is planned 4 weeks post intervention.  She was volume overloaded post procedure and given a dose of IV Lasix.    She continues to note chest discomfort with minimal activity (CCS class III). She feels as though her chest discomfort is worse than previously. She usually takes nitroglycerin with prompt relief. She does note dyspnea associated with her chest pain. She sleeps on several pillows and has done so for several months now. She denies PND. Her LE edema is much improved since discharge from the hospital. She sometimes has palpitations. She denies syncope.    Recent Labs: 09/21/2012: Direct LDL 166.6; HDL Cholesterol 39.60  02/19/2013: Pro B Natriuretic peptide (BNP) 138.0*  04/25/2013: Creatinine 0.93; Hemoglobin 11.7*; Potassium 4.5   Wt Readings from Last 3 Encounters:  04/24/13 280 lb 6.8 oz (127.2 kg)  04/24/13 280 lb 6.8 oz (127.2 kg)  04/23/13 279 lb (126.554 kg)     Past Medical History: 1. Diabetic gastroparesis  2. Type II diabetes  3. HTN  4. Morbid obesity  5. CAD: s/p CABG in 12/11 after prior PCIs. LIMA-LAD, SVG-D, seq SVG-OM1 and OM2, SVG-PDA. Adenosine Cardiolite (8/14) with EF 53% and a small reversible apical defect with a medium, partially reversible  inferior defect. LHC (9/14) with patent SVG-D, LIMA-LAD (50% distal LAD), and sequential SVG-OM branches; the SVG-RCA and the native RCA were occluded (medical management). Patient underwent cutting balloon angioplasty (3/15) of prox RCA occlusion (reduced to 25%).  Distal RCA disease could not be crossed due to heavy diffuse disease.  There was dissection noted in the distal RCA. 6. Atypical migraines  7. OSA: Intolerant of CPAP.  8. GERD with hiatal hernia 9. OA  10. Diastolic CHF: Echo ([id number]) with EF 50-55%,  grade II diastolic dysfunction, mild-moderate MR. Echo (8/14) with EF 55-60%, grade II diastolic dysfunction, mildly increased aortic valve gradient (mean 12 mmHg) but valve opens well, mild MR and mild RV dilation.  11. CKD 12. Chronic LBBB  13. Anxiety  14. Carotid stenosis: Followed by [facility], XX123456 LICA stenosis [id number].  15. Cerebrovascular disease: CVA 2/14 with right posterior cerebral artery territory ischemic infarction. Cerebral angiogram in 6/14 showed 70% right vertebral ostial stenosis, 123456 LICA stenosis, > [id number] proximal left posterior cerebral artery stenosis, posterior communicating artery aneurysm. Patient has had episodes of transient expressive aphasia. Carotid dopplers ([id number]) showed XX123456 LICA stenosis.  16. Positional vertigo (suspected)   Past Medical History  Diagnosis Date  . Vomiting     persistent  . Asthma   . Ventral hernia     hx of  . GERD (gastroesophageal reflux disease)   . Obesity 01-2010  . Hyperlipidemia   . Hypertension   . Irregular heart beat   . Coronary artery disease   . Heart murmur   . Shortness of breath   . Anxiety   . PONV (postoperative nausea and vomiting)   . Peripheral vascular disease     ? numbness, tingling arms and legs  . Anemia     hx  . H/O hiatal hernia   . Neuromuscular disorder     ?  Marland Kitchen Dysrhythmia   . Mental disorder     hx of confusion  . Chronic kidney disease     frequency  . CHF (congestive heart failure)   . Anginal pain   . Myocardial infarction     04/1999, 02/2000, 01/2005; 2011; 2014  . Pneumonia 2000's    "once"  . Chronic bronchitis     "off and on all the time" (04/24/2013)  . Obstructive sleep apnea     "can't wear machine; I have claustrophobia" (04/24/2013)  . Claustrophobia   . Type II diabetes mellitus   . Migraine     "used to have them really bad; kind of eased up now; down to 1 q couple months" (04/24/2013)  . Headache     "probably 4 days/wk" (04/24/2013)  . Stroke 03/22/12    right side  brain; denies residual on 04/24/2013)  . Osteoarthritis     "knees and hands" (04/24/2013)  . Chronic lower back pain     Current Outpatient Prescriptions  Medication Sig Dispense Refill  . albuterol (PROVENTIL) (2.5 MG/3ML) 0.083% nebulizer solution Take 2.5 mg by nebulization every 6 (six) hours as needed for shortness of breath (when asthma is active). If severe asthma , use q4h around the clock x10 days.  For shortness of breath.      Marland Kitchen albuterol (PROVENTIL,VENTOLIN) 90 MCG/ACT inhaler Inhale 2 puffs into the lungs every 6 (six) hours as needed. For shortness of breath.      . ALPRAZolam (XANAX) 0.5 MG tablet Take 0.5 mg by mouth See admin instructions. For anxiety - Patient takes 1 in the morning  and 1 in the evening daily. If needed she may take one in the afternoon.      Marland Kitchen amLODipine (NORVASC) 10 MG tablet Take 1 tablet (10 mg total) by mouth daily.  30 tablet  6  . aspirin EC 81 MG tablet Take 81 mg by mouth daily.      . B Complex-C (B-COMPLEX WITH VITAMIN C) tablet Take 1 tablet by mouth daily.      . calcium carbonate 200 MG capsule Take 1,000 mg by mouth daily with breakfast.      . Cholecalciferol (VITAMIN D) 2000 UNITS tablet Take 2,000 Units by mouth daily.      . cholestyramine (QUESTRAN) 4 GM/DOSE powder 2 scoops two times a day (mixed in juice or light kool-aid).      . cloNIDine (CATAPRES) 0.2 MG tablet Take 0.2 mg by mouth 2 (two) times daily.      . clopidogrel (PLAVIX) 75 MG tablet Take 1 tablet (75 mg total) by mouth daily.  30 tablet  6  . ezetimibe (ZETIA) 10 MG tablet Take 1 tablet (10 mg total) by mouth daily.  30 tablet  3  . FLUoxetine (PROZAC) 20 MG capsule Take 20 mg by mouth daily.      . Fluticasone-Salmeterol (ADVAIR DISKUS) 250-50 MCG/DOSE AEPB Inhale 1 puff into the lungs every morning. For asthma symptoms      . furosemide (LASIX) 40 MG tablet Take 40 mg by mouth daily.      Marland Kitchen gabapentin (NEURONTIN) 600 MG tablet Take 600 mg by mouth at bedtime.       Marland Kitchen  HUMALOG KWIKPEN 100 UNIT/ML SOPN Inject 5 Units into the skin 2 (two) times daily with a meal.       . hydrALAZINE (APRESOLINE) 50 MG tablet Take 50 mg by mouth 3 (three) times daily as needed (for high blood pressure). If BP is >175      . HYDROcodone-acetaminophen (NORCO/VICODIN) 5-325 MG per tablet Take 1 tablet by mouth every evening.       . insulin glargine (LANTUS) 100 UNIT/ML injection Inject 44 Units into the skin every morning.       . insulin glargine (LANTUS) 100 UNIT/ML injection Inject 4-16 Units into the skin at bedtime as needed (For blood sugar = or >165).      Marland Kitchen loperamide (IMODIUM) 2 MG capsule Take 2 mg by mouth as needed for diarrhea or loose stools.      Marland Kitchen losartan (COZAAR) 100 MG tablet Take 100 mg by mouth 2 (two) times daily.       . Magnesium Oxide 250 MG TABS Take 250 mg by mouth 2 (two) times daily.       . metoprolol (LOPRESSOR) 50 MG tablet Take 25 mg by mouth 2 (two) times daily.       . Multiple Vitamin (MULTIVITAMIN) tablet Take 1 tablet by mouth daily.        . nitroGLYCERIN (NITROSTAT) 0.3 MG SL tablet Place 0.3 mg under the tongue every 5 (five) minutes x 3 doses as needed for chest pain. Last taken on 11/02/12 (took 2 doses and chest pain resolved)      . omeprazole (PRILOSEC) 20 MG capsule Take 20 mg by mouth daily.       . ondansetron (ZOFRAN) 4 MG tablet Take 1 tablet (4 mg total) by mouth 2 (two) times daily as needed. For nausea  20 tablet  0  . potassium chloride (K-DUR) 10 MEQ tablet Take 2  tablets (20 mEq total) by mouth daily as needed.  30 tablet  2  . ranitidine (ZANTAC) 150 MG tablet Take 1 tablet (150 mg total) by mouth 2 (two) times daily.      . ranolazine (RANEXA) 1000 MG SR tablet Take 1 tablet (1,000 mg total) by mouth 2 (two) times daily.  60 tablet  6  . rosuvastatin (CRESTOR) 40 MG tablet Take 40 mg by mouth daily.       Marland Kitchen topiramate (TOPAMAX) 50 MG tablet Take 1 tablet (50 mg total) by mouth 2 (two) times daily.  60 tablet  6  .  triamterene-hydrochlorothiazide (DYAZIDE) 37.5-25 MG per capsule Take 1 capsule by mouth every morning.       . vitamin B-12 (CYANOCOBALAMIN) 1000 MCG tablet Take 1,000 mcg by mouth daily.      Marland Kitchen zinc gluconate 50 MG tablet Take 50 mg by mouth daily.      Marland Kitchen zolpidem (AMBIEN) 10 MG tablet Take 10 mg by mouth at bedtime as needed for sleep.        No current facility-administered medications for this visit.    Allergies:   Amoxicillin; Erythromycin; Penicillins; Metformin and related; Tape; and Isosorbide mononitrate   Social History:  The patient  reports that she has never smoked. She has never used smokeless tobacco. She reports that she does not drink alcohol or use illicit drugs.   Family History:  The patient's family history includes Cancer in her sister; Deep vein thrombosis in her father; Diabetes in her father and another family member; Heart attack in her father; Heart disease in her father; Hyperlipidemia in her father; Hypertension in her father and mother.   ROS:  Please see the history of present illness.   She has had some difficulty with nasal congestion and cough secondary to allergies. She denies any further symptoms consistent with her TIAs.   All other systems reviewed and negative.   PHYSICAL EXAM: VS:  BP 144/86  Pulse 71 Well nourished, well developed, in no acute distress HEENT: normal Neck: no JVD Cardiac:  normal S1, S2; RRR; no murmur Lungs:  clear to auscultation bilaterally, no wheezing, rhonchi or rales Abd: soft, nontender, no hepatomegaly Ext: no edemabilateral groins without hematoma or bruit Skin: warm and dry Neuro:  CNs 2-12 intact, no focal abnormalities noted  EKG:  NSR, HR 69, LBBB     ASSESSMENT AND PLAN:  1. CAD s/p CABG:  Patient continues to have CCS class III angina in the setting of chronically occluded RCA. I reviewed this with Dr. Casandra Doffing today. He recommends that we arrange repeat CTO PCI of the RCA hopefully to address the distal  vessel in May. She is on maximum dose amlodipine. Her heart rate would not be able to tolerate much more metoprolol. She is on maximum dose Ranexa. She was not able to tolerate isosorbide in the past. I will try to place her back on low-dose Imdur 15 mg a day to see if she can tolerate this. She knows to go to the emergency room if she has unstable symptoms. She will be seen back in follow up prior to her procedure. 2. Chronic Diastolic CHF:  Volume appears stable. Continue current therapy. 3. Carotid Stenosis:  Follow up with vascular surgery as planned. 4. Hypertension:  Controlled. 5. Hyperlipidemia:  Continue statin. 6. Chronic Kidney Disease:  Recent creatinine optimal. 7. Disposition:  She will follow up with me prior to her procedure in May.   Signed,  Richardson Dopp, PA-C  05/09/2013 9:02 AM

## 2013-05-09 NOTE — Patient Instructions (Signed)
START IMDUR 15 MG EVERY NIGHT AT BEDTIME  LAB WORK TODAY  YOU HAVE A FOLLOW UP WITH SCOTT WEAVER, [facility] 06/11/13 @ 2:20 SAME DAY DR. [name] IS IN THE OFFICE  YOU HAVE BEEN SCHEDULE FOR YOUR PROCEDURE TO BE DONE ON 06/13/13 @ 8:30 AM WITH DR. VARANASI

## 2013-05-10 ENCOUNTER — Other Ambulatory Visit: Payer: Medicare Other

## 2013-05-14 ENCOUNTER — Other Ambulatory Visit: Payer: Self-pay | Admitting: *Deleted

## 2013-05-14 DIAGNOSIS — E785 Hyperlipidemia, unspecified: Secondary | ICD-10-CM

## 2013-05-14 DIAGNOSIS — I2510 Atherosclerotic heart disease of native coronary artery without angina pectoris: Secondary | ICD-10-CM

## 2013-05-14 NOTE — Progress Notes (Signed)
Discussed with patient, she is agreeable to scheduling appt in lipid clinic. I will forward to [facility] to schedule.

## 2013-05-17 ENCOUNTER — Ambulatory Visit (INDEPENDENT_AMBULATORY_CARE_PROVIDER_SITE_OTHER): Payer: Medicare Other | Admitting: Pharmacist

## 2013-05-17 VITALS — Wt 273.0 lb

## 2013-05-17 DIAGNOSIS — I209 Angina pectoris, unspecified: Secondary | ICD-10-CM

## 2013-05-17 DIAGNOSIS — E785 Hyperlipidemia, unspecified: Secondary | ICD-10-CM

## 2013-05-17 NOTE — Progress Notes (Signed)
Patient is a pleasant 68 y.o. WF referred to lipid clinic by Dr. [name] to see if she qualified for the [facility] clinical trial.  Patient has a h/o CABG in 2011, multiple PCIs, strong family h/o CAD, Diabetes, h/o CVA, and HTN.  Her last LDL was 127 mg/dL despite taking Crestor 40 mg qd, Zetia 10 mg qd, and Questran bid.  Patient has an upcoming PCI next month with Dr. Irish Lack.  She doesn't have any known history of cancer and already takes SQ medication on a regular basis, so she appears to qualify for SPIRE-2 study at this time.  Patient is eager to do anything she can to further reduce her cardiovascular risk and is excited about the opportunity to get into one of the PCSK-9 inhibitor studies.  She understands this is a placebo controlled study and will likely be 4 years in duration.  She also understands that other PCSK-9 inhibitor agents may be on the market prior to the end of the study.  I explained that this would be on top of her current therapy, and that is not replacing her current therapy.  She wishes to proceed at this time.  Sh  LDL goal < 70, non-HDL goal < 100 Meds:  Crestor 40 mg qd, Zetia 10 mg qd, Questran bid.  Labs: 05/2013:  TC 197, LDL 127, HDL 42, TG 140    Current Outpatient Prescriptions  Medication Sig Dispense Refill  . albuterol (PROVENTIL) (2.5 MG/3ML) 0.083% nebulizer solution Take 2.5 mg by nebulization every 6 (six) hours as needed for shortness of breath (when asthma is active). If severe asthma , use q4h around the clock x10 days.  For shortness of breath.      Marland Kitchen albuterol (PROVENTIL,VENTOLIN) 90 MCG/ACT inhaler Inhale 2 puffs into the lungs every 6 (six) hours as needed. For shortness of breath.      . ALPRAZolam (XANAX) 0.5 MG tablet Take 0.5 mg by mouth See admin instructions. For anxiety - Patient takes 1 in the morning and 1 in the evening daily. If needed she may take one in the afternoon.      Marland Kitchen amLODipine (NORVASC) 10 MG tablet Take 1 tablet (10 mg total) by  mouth daily.  30 tablet  6  . aspirin EC 81 MG tablet Take 81 mg by mouth daily.      . B Complex-C (B-COMPLEX WITH VITAMIN C) tablet Take 1 tablet by mouth daily.      . calcium carbonate 200 MG capsule Take 1,000 mg by mouth daily with breakfast.      . Cholecalciferol (VITAMIN D) 2000 UNITS tablet Take 2,000 Units by mouth daily.      . cholestyramine (QUESTRAN) 4 GM/DOSE powder 2 scoops two times a day (mixed in juice or light kool-aid).      . cloNIDine (CATAPRES) 0.2 MG tablet Take 0.2 mg by mouth 2 (two) times daily.      . clopidogrel (PLAVIX) 75 MG tablet Take 1 tablet (75 mg total) by mouth daily.  30 tablet  6  . ezetimibe (ZETIA) 10 MG tablet Take 1 tablet (10 mg total) by mouth daily.  30 tablet  3  . FLUoxetine (PROZAC) 20 MG capsule Take 20 mg by mouth daily.      . Fluticasone-Salmeterol (ADVAIR DISKUS) 250-50 MCG/DOSE AEPB Inhale 1 puff into the lungs every morning. For asthma symptoms      . furosemide (LASIX) 40 MG tablet Take 40 mg by mouth daily.      Marland Kitchen  gabapentin (NEURONTIN) 600 MG tablet Take 600 mg by mouth at bedtime.       Marland Kitchen HUMALOG KWIKPEN 100 UNIT/ML SOPN Inject 5 Units into the skin 2 (two) times daily with a meal.       . hydrALAZINE (APRESOLINE) 50 MG tablet Take 50 mg by mouth 3 (three) times daily as needed (for high blood pressure). If BP is >175      . HYDROcodone-acetaminophen (NORCO/VICODIN) 5-325 MG per tablet Take 1 tablet by mouth every evening.       . insulin glargine (LANTUS) 100 UNIT/ML injection Inject 44 Units into the skin every morning.       . insulin glargine (LANTUS) 100 UNIT/ML injection Inject 4-16 Units into the skin at bedtime as needed (For blood sugar = or >165).      . isosorbide mononitrate (IMDUR) 30 MG 24 hr tablet Take 0.5 tablets (15 mg total) by mouth at bedtime.  30 tablet  11  . loperamide (IMODIUM) 2 MG capsule Take 2 mg by mouth as needed for diarrhea or loose stools.      Marland Kitchen losartan (COZAAR) 100 MG tablet Take 100 mg by mouth 2  (two) times daily.       . Magnesium Oxide 250 MG TABS Take 250 mg by mouth 2 (two) times daily.       . metoprolol (LOPRESSOR) 50 MG tablet Take 25 mg by mouth 2 (two) times daily.       . Multiple Vitamin (MULTIVITAMIN) tablet Take 1 tablet by mouth daily.        . nitroGLYCERIN (NITROSTAT) 0.3 MG SL tablet Place 0.3 mg under the tongue every 5 (five) minutes x 3 doses as needed for chest pain. Last taken on 11/02/12 (took 2 doses and chest pain resolved)      . omeprazole (PRILOSEC) 20 MG capsule Take 20 mg by mouth daily.       . ondansetron (ZOFRAN) 4 MG tablet Take 1 tablet (4 mg total) by mouth 2 (two) times daily as needed. For nausea  20 tablet  0  . potassium chloride (K-DUR) 10 MEQ tablet Take 2 tablets (20 mEq total) by mouth daily as needed.  30 tablet  2  . ranitidine (ZANTAC) 150 MG tablet Take 1 tablet (150 mg total) by mouth 2 (two) times daily.      . ranolazine (RANEXA) 1000 MG SR tablet Take 1 tablet (1,000 mg total) by mouth 2 (two) times daily.  60 tablet  6  . rosuvastatin (CRESTOR) 40 MG tablet Take 40 mg by mouth daily.       Marland Kitchen topiramate (TOPAMAX) 50 MG tablet Take 1 tablet (50 mg total) by mouth 2 (two) times daily.  60 tablet  6  . triamterene-hydrochlorothiazide (DYAZIDE) 37.5-25 MG per capsule Take 1 capsule by mouth every morning.       . vitamin B-12 (CYANOCOBALAMIN) 1000 MCG tablet Take 1,000 mcg by mouth daily.      Marland Kitchen zinc gluconate 50 MG tablet Take 50 mg by mouth daily.      Marland Kitchen zolpidem (AMBIEN) 10 MG tablet Take 10 mg by mouth at bedtime as needed for sleep.        No current facility-administered medications for this visit.   Allergies  Allergen Reactions  . Amoxicillin Shortness Of Breath and Rash  . Erythromycin Shortness Of Breath and Other (See Comments)    Trouble swallowing  . Penicillins Shortness Of Breath and Rash  . Metformin And  Related     dizziness and nausea  . Tape Other (See Comments)    Plastic tape causes skin to rip if left on for  long periods of time  . Isosorbide Mononitrate [Isosorbide]     Joint aches, muscles hurt, difficult to walk   Family History  Problem Relation Age of Onset  . Heart disease Father     Heart Disease before age 68  . Diabetes Father   . Hyperlipidemia Father   . Hypertension Father   . Heart attack Father   . Deep vein thrombosis Father   . Diabetes      family hx of paternal grandmother, aunt and grandfather- uncles  . Hypertension Mother   . Cancer Sister

## 2013-05-17 NOTE — Assessment & Plan Note (Signed)
Patient and I discussed the SPIRE-2 study, and the medication bococizumab at length, and she wishes to proceed.  I will pass her information on to [employer].  She understands that she may need to wait to enroll given she has a planned PCI next month with Dr. Irish Lack.  They will contact her regarding information on screening for study.

## 2013-06-04 ENCOUNTER — Encounter (HOSPITAL_COMMUNITY): Payer: Self-pay | Admitting: Pharmacy Technician

## 2013-06-07 ENCOUNTER — Ambulatory Visit: Payer: Medicare Other | Admitting: Cardiology

## 2013-06-11 ENCOUNTER — Encounter: Payer: Self-pay | Admitting: Physician Assistant

## 2013-06-11 ENCOUNTER — Telehealth: Payer: Self-pay | Admitting: *Deleted

## 2013-06-11 ENCOUNTER — Ambulatory Visit (INDEPENDENT_AMBULATORY_CARE_PROVIDER_SITE_OTHER): Payer: Medicare Other | Admitting: Physician Assistant

## 2013-06-11 VITALS — BP 130/70 | HR 54 | Ht 62.5 in | Wt 274.0 lb

## 2013-06-11 DIAGNOSIS — I208 Other forms of angina pectoris: Secondary | ICD-10-CM

## 2013-06-11 DIAGNOSIS — I509 Heart failure, unspecified: Secondary | ICD-10-CM

## 2013-06-11 DIAGNOSIS — I5032 Chronic diastolic (congestive) heart failure: Secondary | ICD-10-CM

## 2013-06-11 DIAGNOSIS — E785 Hyperlipidemia, unspecified: Secondary | ICD-10-CM

## 2013-06-11 DIAGNOSIS — I2510 Atherosclerotic heart disease of native coronary artery without angina pectoris: Secondary | ICD-10-CM

## 2013-06-11 DIAGNOSIS — I209 Angina pectoris, unspecified: Secondary | ICD-10-CM

## 2013-06-11 DIAGNOSIS — I10 Essential (primary) hypertension: Secondary | ICD-10-CM

## 2013-06-11 LAB — PROTIME-INR
INR: 1 ratio (ref 0.8–1.0)
Prothrombin Time: 11.3 s (ref 9.6–13.1)

## 2013-06-11 LAB — CBC WITH DIFFERENTIAL/PLATELET
Basophils Absolute: 0 10*3/uL (ref 0.0–0.1)
Basophils Relative: 0 % (ref 0.0–3.0)
Eosinophils Absolute: 0.1 10*3/uL (ref 0.0–0.7)
Eosinophils Relative: 1.2 % (ref 0.0–5.0)
HCT: 42 % (ref 36.0–46.0)
Hemoglobin: 14.1 g/dL (ref 12.0–15.0)
Lymphocytes Relative: 22.4 % (ref 12.0–46.0)
Lymphs Abs: 2.3 10*3/uL (ref 0.7–4.0)
MCHC: 33.7 g/dL (ref 30.0–36.0)
MCV: 95.2 fl (ref 78.0–100.0)
Monocytes Absolute: 0.7 10*3/uL (ref 0.1–1.0)
Monocytes Relative: 6.6 % (ref 3.0–12.0)
Neutro Abs: 7.3 10*3/uL (ref 1.4–7.7)
Neutrophils Relative %: 69.8 % (ref 43.0–77.0)
Platelets: 275 10*3/uL (ref 150.0–400.0)
RBC: 4.41 Mil/uL (ref 3.87–5.11)
RDW: 13 % (ref 11.5–15.5)
WBC: 10.4 10*3/uL (ref 4.0–10.5)

## 2013-06-11 LAB — BASIC METABOLIC PANEL
BUN: 32 mg/dL — ABNORMAL HIGH (ref 6–23)
CO2: 29 mEq/L (ref 19–32)
Calcium: 9.7 mg/dL (ref 8.4–10.5)
Chloride: 100 mEq/L (ref 96–112)
Creatinine, Ser: 1.3 mg/dL — ABNORMAL HIGH (ref 0.4–1.2)
GFR: 45.22 mL/min — ABNORMAL LOW (ref >60.00)
Glucose, Bld: 174 mg/dL — ABNORMAL HIGH (ref 70–99)
Potassium: 4.4 mEq/L (ref 3.5–5.1)
Sodium: 138 mEq/L (ref 135–145)

## 2013-06-11 MED ORDER — METOPROLOL TARTRATE 25 MG PO TABS: 25.0000 mg | ORAL_TABLET | Freq: Two times a day (BID) | ORAL | Status: DC

## 2013-06-11 NOTE — H&P (Signed)
History and Physical   Date:  06/11/2013   ID:  [name] [name], DOB 05-25-1945, [name] [id number]  PCP:  Marylene Land, MD  Cardiologist:  Dr. Loralie Champagne     History of Present Illness: [name] [name] is a 68 y.o. female with a hx of CAD s/p CABG, diastolic CHF, and cerebrovascular disease with history of CVA.  She had CABG x 5 in 12/11. Prior to the CABG she had multiple PCIs. She had a CVA in 2/14 that presented as visual loss. She ended up getting a cerebral angiogram in 6/14 with 70% ostial right vertebral stenosis, > 70% stenosis proximal left posterior cerebral artery, and 123456 LICA stenosis. She has been on ASA and Plavix. She recently saw Dr. Scot Dock, and carotid dopplers suggested worsening of LICA stenosis to > [id number]. She has had episodes of transient expressive aphasia for several months. She ultimately underwent L carotid stent by Dr. Trula Slade on 03/20/13.    Given dyspnea and chest pain that was worsened from her baseline, she underwent left heart catheterization in 9/14. This showed patent SVG-Dx, LIMA-LAD, and sequential SVG-OM branches but SVG-RCA and the native RCA were both totally occluded. It was suspect that her increased symptoms coincided with occlusion of SVG-RCA.  She was intolerant of Imdur due to leg cramps. She has been placed on ranolazine and metoprolol.  She was ultimately referred to Dr. Irish Lack for CTO PCI of the RCA.  She underwent successful cutting balloon angioplasty of prox RCA occlusion (reduced to 25%).  Distal RCA disease could not be crossed due to heavy diffuse disease.  There was dissection noted in the distal RCA.  It was felt that after the dissection healed in the mid to dist RCA, there may be a track to complete revascularization of the distal occlusion.    I saw her 05/09/13.  She continued to have CCS Class 3 angina.  I reviewed her case with Dr. Casandra Doffing.  Plan is to proceed with 2nd attempt at CTO PCI of the RCA this week.  I tried her on low  dose Imdur again.  She returns for f/u.  She could not tolerate the Imdur and had to stop it.  She continues to note CCS Class 3 angina.  She notes NYHA Class 2b-3 dyspnea.  No syncope.  She does note some lightheadedness.  She sleeps on several pillows chronically.  No PND.  She notes mild pedal edema.  No weight changes.  No significant cough.     Recent Labs: 09/21/2012: Direct LDL 166.6  02/19/2013: Pro B Natriuretic peptide (BNP) 138.0*  04/25/2013: Creatinine 0.93; Hemoglobin 11.7*; Potassium 4.5  05/09/2013: ALT 18; HDL Cholesterol 42.40; LDL (calc) 127*   Wt Readings from Last 3 Encounters:  06/11/13 274 lb (124.286 kg)  05/17/13 273 lb (123.832 kg)  04/24/13 280 lb 6.8 oz (127.2 kg)     Past Medical History: 1. Diabetic gastroparesis  2. Type II diabetes  3. HTN  4. Morbid obesity  5. CAD: s/p CABG in 12/11 after prior PCIs. LIMA-LAD, SVG-D, seq SVG-OM1 and OM2, SVG-PDA. Adenosine Cardiolite (8/14) with EF 53% and a small reversible apical defect with a medium, partially reversible inferior defect. LHC (9/14) with patent SVG-D, LIMA-LAD (50% distal LAD), and sequential SVG-OM branches; the SVG-RCA and the native RCA were occluded (medical management). Patient underwent cutting balloon angioplasty (3/15) of prox RCA occlusion (reduced to 25%).  Distal RCA disease could not be crossed due to heavy diffuse disease.  There  was dissection noted in the distal RCA. 6. Atypical migraines  7. OSA: Intolerant of CPAP.  8. GERD with hiatal hernia 9. OA  10. Diastolic CHF: Echo ([id number]) with EF 50-55%, grade II diastolic dysfunction, mild-moderate MR. Echo (8/14) with EF 55-60%, grade II diastolic dysfunction, mildly increased aortic valve gradient (mean 12 mmHg) but valve opens well, mild MR and mild RV dilation.  11. CKD 12. Chronic LBBB  13. Anxiety  14. Carotid stenosis: Followed by [facility], [id number] LICA stenosis [id number].  15. Cerebrovascular disease: CVA 2/14 with right posterior cerebral  artery territory ischemic infarction. Cerebral angiogram in 6/14 showed 70% right vertebral ostial stenosis, 123456 LICA stenosis, > [id number] proximal left posterior cerebral artery stenosis, posterior communicating artery aneurysm. Patient has had episodes of transient expressive aphasia. Carotid dopplers ([id number]) showed [id number] LICA stenosis.  16. Positional vertigo (suspected)   Past Medical History  Diagnosis Date  . Vomiting     persistent  . Asthma   . Ventral hernia     hx of  . GERD (gastroesophageal reflux disease)   . Obesity 01-2010  . Hyperlipidemia   . Hypertension   . Irregular heart beat   . Coronary artery disease   . Heart murmur   . Shortness of breath   . Anxiety   . PONV (postoperative nausea and vomiting)   . Peripheral vascular disease     ? numbness, tingling arms and legs  . Anemia     hx  . H/O hiatal hernia   . Neuromuscular disorder     ?  Marland Kitchen Dysrhythmia   . Mental disorder     hx of confusion  . Chronic kidney disease     frequency  . CHF (congestive heart failure)   . Anginal pain   . Myocardial infarction     04/1999, 02/2000, 01/2005; 2011; 2014  . Pneumonia 2000's    "once"  . Chronic bronchitis     "off and on all the time" (04/24/2013)  . Obstructive sleep apnea     "can't wear machine; I have claustrophobia" (04/24/2013)  . Claustrophobia   . Type II diabetes mellitus   . Migraine     "used to have them really bad; kind of eased up now; down to 1 q couple months" (04/24/2013)  . Headache     "probably 4 days/wk" (04/24/2013)  . Stroke 03/22/12    right side brain; denies residual on 04/24/2013)  . Osteoarthritis     "knees and hands" (04/24/2013)  . Chronic lower back pain     Current Outpatient Prescriptions  Medication Sig Dispense Refill  . albuterol (PROVENTIL) (2.5 MG/3ML) 0.083% nebulizer solution Take 2.5 mg by nebulization every 6 (six) hours as needed for shortness of breath (when asthma is active). If severe asthma , use q4h around  the clock x10 days.  For shortness of breath.      Marland Kitchen albuterol (PROVENTIL,VENTOLIN) 90 MCG/ACT inhaler Inhale 2 puffs into the lungs every 6 (six) hours as needed. For shortness of breath.      . ALPRAZolam (XANAX) 0.5 MG tablet Take 0.5 mg by mouth 2 (two) times daily as needed for anxiety. For anxiety - Patient takes 1 in the morning and 1 in the evening daily. If needed she may take one in the afternoon.      Marland Kitchen amLODipine (NORVASC) 10 MG tablet Take 1 tablet (10 mg total) by mouth daily.  30 tablet  6  . aspirin EC 81 MG tablet Take  81 mg by mouth daily.      . B Complex-C (B-COMPLEX WITH VITAMIN C) tablet Take 1 tablet by mouth daily.      . calcium carbonate 200 MG capsule Take 1,000 mg by mouth daily with breakfast.      . Cholecalciferol (VITAMIN D) 2000 UNITS tablet Take 2,000 Units by mouth daily.      . cholestyramine (QUESTRAN) 4 GM/DOSE powder Take 2 g by mouth 2 (two) times daily with a meal. (mixed in juice or light kool-aid).      . cloNIDine (CATAPRES) 0.2 MG tablet Take 0.2 mg by mouth 2 (two) times daily.      . clopidogrel (PLAVIX) 75 MG tablet Take 1 tablet (75 mg total) by mouth daily.  30 tablet  6  . ezetimibe (ZETIA) 10 MG tablet Take 1 tablet (10 mg total) by mouth daily.  30 tablet  3  . FLUoxetine (PROZAC) 20 MG capsule Take 20 mg by mouth daily.      . Fluticasone-Salmeterol (ADVAIR DISKUS) 250-50 MCG/DOSE AEPB Inhale 1 puff into the lungs every morning. For asthma symptoms      . furosemide (LASIX) 40 MG tablet Take 40 mg by mouth daily.      Marland Kitchen gabapentin (NEURONTIN) 600 MG tablet Take 600 mg by mouth at bedtime.       Marland Kitchen HUMALOG KWIKPEN 100 UNIT/ML SOPN Inject 5 Units into the skin 2 (two) times daily with a meal.       . hydrALAZINE (APRESOLINE) 50 MG tablet Take 50 mg by mouth 3 (three) times daily as needed (for high blood pressure). If BP is >175      . HYDROcodone-acetaminophen (NORCO/VICODIN) 5-325 MG per tablet Take 1 tablet by mouth every 6 (six) hours as needed  for moderate pain.       Marland Kitchen insulin glargine (LANTUS) 100 UNIT/ML injection Inject 44 Units into the skin every morning.       . insulin glargine (LANTUS) 100 UNIT/ML injection Inject 4-16 Units into the skin at bedtime as needed (For blood sugar = or >165).      Marland Kitchen loperamide (IMODIUM) 2 MG capsule Take 2 mg by mouth as needed for diarrhea or loose stools.      Marland Kitchen losartan (COZAAR) 100 MG tablet Take 100 mg by mouth 2 (two) times daily.       . Magnesium Oxide 250 MG TABS Take 250 mg by mouth 2 (two) times daily.       . metoprolol (LOPRESSOR) 50 MG tablet Take 25 mg by mouth 2 (two) times daily.       . Multiple Vitamin (MULTIVITAMIN) tablet Take 1 tablet by mouth daily.        . nitroGLYCERIN (NITROSTAT) 0.3 MG SL tablet Place 0.3 mg under the tongue every 5 (five) minutes x 3 doses as needed for chest pain. Last taken on 11/02/12 (took 2 doses and chest pain resolved)      . omeprazole (PRILOSEC) 20 MG capsule Take 20 mg by mouth daily.       . ondansetron (ZOFRAN) 4 MG tablet Take 1 tablet (4 mg total) by mouth 2 (two) times daily as needed. For nausea  20 tablet  0  . oxyCODONE-acetaminophen (PERCOCET/ROXICET) 5-325 MG per tablet Take 1 tablet by mouth every 6 (six) hours as needed for severe pain.      . potassium chloride (K-DUR) 10 MEQ tablet Take 20 mEq by mouth daily as needed (for excess fluid).      Marland Kitchen  ranitidine (ZANTAC) 150 MG tablet Take 1 tablet (150 mg total) by mouth 2 (two) times daily.      . ranolazine (RANEXA) 1000 MG SR tablet Take 1 tablet (1,000 mg total) by mouth 2 (two) times daily.  60 tablet  6  . rosuvastatin (CRESTOR) 40 MG tablet Take 40 mg by mouth daily.       Marland Kitchen topiramate (TOPAMAX) 50 MG tablet Take 1 tablet (50 mg total) by mouth 2 (two) times daily.  60 tablet  6  . triamterene-hydrochlorothiazide (DYAZIDE) 37.5-25 MG per capsule Take 1 capsule by mouth every morning.       . vitamin B-12 (CYANOCOBALAMIN) 1000 MCG tablet Take 1,000 mcg by mouth daily.      Marland Kitchen zinc  gluconate 50 MG tablet Take 50 mg by mouth daily.      Marland Kitchen zolpidem (AMBIEN) 10 MG tablet Take 10 mg by mouth at bedtime as needed for sleep.        No current facility-administered medications for this visit.    Allergies:   Amoxicillin; Erythromycin; Penicillins; Metformin and related; Tape; and Isosorbide mononitrate   Social History:  The patient  reports that she has never smoked. She has never used smokeless tobacco. She reports that she does not drink alcohol or use illicit drugs.   Family History:  The patient's family history includes Cancer in her sister; Deep vein thrombosis in her father; Diabetes in her father and another family member; Heart attack in her father; Heart disease in her father; Hyperlipidemia in her father; Hypertension in her father and mother.   ROS:  Please see the history of present illness.   She notes occ nausea and diarrhea.   All other systems reviewed and negative.   PHYSICAL EXAM: VS:  BP 130/70  Pulse 54  Ht 5' 2.5" (1.588 m)  Wt 274 lb (124.286 kg)  BMI 49.29 kg/m2 Well nourished, well developed, in no acute distress HEENT: normal Neck: no JVD Cardiac:  normal S1, S2; RRR; no murmur Lungs:  clear to auscultation bilaterally, no wheezing, rhonchi or rales Abd: soft, nontender, no hepatomegaly Ext: very trace bilateral LE edema  Skin: warm and dry Neuro:  CNs 2-12 intact, no focal abnormalities noted  EKG:   Sinus brady, HR 54, LBBB      ASSESSMENT AND PLAN:  1. CAD s/p CABG:   She has continued to have CCS class III angina in the setting of chronically occluded RCA.  She did not tolerate Imdur.  She is bradycardic with her beta blocker and somewhat symptomatic.  I will decrease her Metoprolol to 25 mg bid.  She is scheduled to have repeat CTO PCI of the RCA this week.  Risks and benefits of cardiac catheterization have been discussed with the patient.  These include bleeding, infection, kidney damage, stroke, heart attack, death.  The patient  understands these risks and is willing to proceed.   2. Chronic Diastolic CHF:  Volume appears stable. Continue current therapy. 3. Carotid Stenosis:  Follow up with vascular surgery as planned. 4. Hypertension:  Controlled. 5. Hyperlipidemia:  Continue statin.  Recent labs not optimal.  [facility] and may be enrolled in [place]. 6. Chronic Kidney Disease:  Repeat BMET today as part of pre-cath work up. 7. Disposition:  Proceed with CTO PCI this week with Dr. Casandra Doffing.   Signed, Richardson Dopp, PA-C  06/11/2013 2:34 PM   Agree with above. Patient well known to me.  Plan  PCI this week.

## 2013-06-11 NOTE — Addendum Note (Signed)
Addended by: Michae Kava on: 06/11/2013 03:00 PM   Modules accepted: Orders

## 2013-06-11 NOTE — Patient Instructions (Signed)
PRE CATH LAB WORK TODAY; BMET, CBC W/DIFF, PT/INR  DECREASE METOPROLOL TO 25 MG TWICE DAILY

## 2013-06-11 NOTE — Telephone Encounter (Signed)
pt notified about lab results and to decrease lasix to 20 mg until after cath. Pt praised our office up and down and said how wonderful we all are from the front desk all the way to the dr's area. I told her I will let our clinical Manager know of this.

## 2013-06-11 NOTE — Progress Notes (Signed)
[name] [name], [place], [place]  [address] Phone: [phone] Fax:  [phone]  Date:  06/11/2013   ID:  [name], DOB 03/09/1945, [name] [id number]  PCP:  Marylene Land, MD  Cardiologist:  Dr. Loralie Champagne     History of Present Illness: [name] [name] is a 68 y.o. female with a hx of CAD s/p CABG, diastolic CHF, and cerebrovascular disease with history of CVA.  She had CABG x 5 in 12/11. Prior to the CABG she had multiple PCIs. She had a CVA in 2/14 that presented as visual loss. She ended up getting a cerebral angiogram in 6/14 with 70% ostial right vertebral stenosis, > 70% stenosis proximal left posterior cerebral artery, and 123456 LICA stenosis. She has been on ASA and Plavix. She recently saw Dr. Scot Dock, and carotid dopplers suggested worsening of LICA stenosis to > [id number]. She has had episodes of transient expressive aphasia for several months. She ultimately underwent L carotid stent by Dr. Trula Slade on 03/20/13.    Given dyspnea and chest pain that was worsened from her baseline, she underwent left heart catheterization in 9/14. This showed patent SVG-Dx, LIMA-LAD, and sequential SVG-OM branches but SVG-RCA and the native RCA were both totally occluded. It was suspect that her increased symptoms coincided with occlusion of SVG-RCA.  She was intolerant of Imdur due to leg cramps. She has been placed on ranolazine and metoprolol.  She was ultimately referred to Dr. Irish Lack for CTO PCI of the RCA.  She underwent successful cutting balloon angioplasty of prox RCA occlusion (reduced to 25%).  Distal RCA disease could not be crossed due to heavy diffuse disease.  There was dissection noted in the distal RCA.  It was felt that after the dissection healed in the mid to dist RCA, there may be a track to complete revascularization of the distal occlusion.    I saw her 05/09/13.  She continued to have CCS Class 3 angina.  I reviewed her case with Dr. Casandra Doffing.  Plan is to proceed  with 2nd attempt at CTO PCI of the RCA this week.  I tried her on low dose Imdur again.  She returns for f/u.  She could not tolerate the Imdur and had to stop it.  She continues to note CCS Class 3 angina.  She notes NYHA Class 2b-3 dyspnea.  No syncope.  She does note some lightheadedness.  She sleeps on several pillows chronically.  No PND.  She notes mild pedal edema.  No weight changes.  No significant cough.     Recent Labs: 09/21/2012: Direct LDL 166.6  02/19/2013: Pro B Natriuretic peptide (BNP) 138.0*  04/25/2013: Creatinine 0.93; Hemoglobin 11.7*; Potassium 4.5  05/09/2013: ALT 18; HDL Cholesterol 42.40; LDL (calc) 127*   Wt Readings from Last 3 Encounters:  06/11/13 274 lb (124.286 kg)  05/17/13 273 lb (123.832 kg)  04/24/13 280 lb 6.8 oz (127.2 kg)     Past Medical History: 1. Diabetic gastroparesis  2. Type II diabetes  3. HTN  4. Morbid obesity  5. CAD: s/p CABG in 12/11 after prior PCIs. LIMA-LAD, SVG-D, seq SVG-OM1 and OM2, SVG-PDA. Adenosine Cardiolite (8/14) with EF 53% and a small reversible apical defect with a medium, partially reversible inferior defect. LHC (9/14) with patent SVG-D, LIMA-LAD (50% distal LAD), and sequential SVG-OM branches; the SVG-RCA and the native RCA were occluded (medical management). Patient underwent cutting balloon angioplasty (3/15) of prox RCA occlusion (reduced to 25%).  Distal RCA  disease could not be crossed due to heavy diffuse disease.  There was dissection noted in the distal RCA. 6. Atypical migraines  7. OSA: Intolerant of CPAP.  8. GERD with hiatal hernia 9. OA  10. Diastolic CHF: Echo ([id number]) with EF 50-55%, grade II diastolic dysfunction, mild-moderate MR. Echo (8/14) with EF 55-60%, grade II diastolic dysfunction, mildly increased aortic valve gradient (mean 12 mmHg) but valve opens well, mild MR and mild RV dilation.  11. CKD 12. Chronic LBBB  13. Anxiety  14. Carotid stenosis: Followed by [facility], [id number] LICA stenosis [id number].  15.  Cerebrovascular disease: CVA 2/14 with right posterior cerebral artery territory ischemic infarction. Cerebral angiogram in 6/14 showed 70% right vertebral ostial stenosis, 123456 LICA stenosis, > [id number] proximal left posterior cerebral artery stenosis, posterior communicating artery aneurysm. Patient has had episodes of transient expressive aphasia. Carotid dopplers ([id number]) showed XX123456 LICA stenosis.  16. Positional vertigo (suspected)   Past Medical History  Diagnosis Date  . Vomiting     persistent  . Asthma   . Ventral hernia     hx of  . GERD (gastroesophageal reflux disease)   . Obesity 01-2010  . Hyperlipidemia   . Hypertension   . Irregular heart beat   . Coronary artery disease   . Heart murmur   . Shortness of breath   . Anxiety   . PONV (postoperative nausea and vomiting)   . Peripheral vascular disease     ? numbness, tingling arms and legs  . Anemia     hx  . H/O hiatal hernia   . Neuromuscular disorder     ?  Marland Kitchen Dysrhythmia   . Mental disorder     hx of confusion  . Chronic kidney disease     frequency  . CHF (congestive heart failure)   . Anginal pain   . Myocardial infarction     04/1999, 02/2000, 01/2005; 2011; 2014  . Pneumonia 2000's    "once"  . Chronic bronchitis     "off and on all the time" (04/24/2013)  . Obstructive sleep apnea     "can't wear machine; I have claustrophobia" (04/24/2013)  . Claustrophobia   . Type II diabetes mellitus   . Migraine     "used to have them really bad; kind of eased up now; down to 1 q couple months" (04/24/2013)  . Headache     "probably 4 days/wk" (04/24/2013)  . Stroke 03/22/12    right side brain; denies residual on 04/24/2013)  . Osteoarthritis     "knees and hands" (04/24/2013)  . Chronic lower back pain     Current Outpatient Prescriptions  Medication Sig Dispense Refill  . albuterol (PROVENTIL) (2.5 MG/3ML) 0.083% nebulizer solution Take 2.5 mg by nebulization every 6 (six) hours as needed for shortness of  breath (when asthma is active). If severe asthma , use q4h around the clock x10 days.  For shortness of breath.      Marland Kitchen albuterol (PROVENTIL,VENTOLIN) 90 MCG/ACT inhaler Inhale 2 puffs into the lungs every 6 (six) hours as needed. For shortness of breath.      . ALPRAZolam (XANAX) 0.5 MG tablet Take 0.5 mg by mouth 2 (two) times daily as needed for anxiety. For anxiety - Patient takes 1 in the morning and 1 in the evening daily. If needed she may take one in the afternoon.      Marland Kitchen amLODipine (NORVASC) 10 MG tablet Take 1 tablet (10 mg total) by mouth daily.  30 tablet  6  . aspirin EC 81 MG tablet Take 81 mg by mouth daily.      . B Complex-C (B-COMPLEX WITH VITAMIN C) tablet Take 1 tablet by mouth daily.      . calcium carbonate 200 MG capsule Take 1,000 mg by mouth daily with breakfast.      . Cholecalciferol (VITAMIN D) 2000 UNITS tablet Take 2,000 Units by mouth daily.      . cholestyramine (QUESTRAN) 4 GM/DOSE powder Take 2 g by mouth 2 (two) times daily with a meal. (mixed in juice or light kool-aid).      . cloNIDine (CATAPRES) 0.2 MG tablet Take 0.2 mg by mouth 2 (two) times daily.      . clopidogrel (PLAVIX) 75 MG tablet Take 1 tablet (75 mg total) by mouth daily.  30 tablet  6  . ezetimibe (ZETIA) 10 MG tablet Take 1 tablet (10 mg total) by mouth daily.  30 tablet  3  . FLUoxetine (PROZAC) 20 MG capsule Take 20 mg by mouth daily.      . Fluticasone-Salmeterol (ADVAIR DISKUS) 250-50 MCG/DOSE AEPB Inhale 1 puff into the lungs every morning. For asthma symptoms      . furosemide (LASIX) 40 MG tablet Take 40 mg by mouth daily.      Marland Kitchen gabapentin (NEURONTIN) 600 MG tablet Take 600 mg by mouth at bedtime.       Marland Kitchen HUMALOG KWIKPEN 100 UNIT/ML SOPN Inject 5 Units into the skin 2 (two) times daily with a meal.       . hydrALAZINE (APRESOLINE) 50 MG tablet Take 50 mg by mouth 3 (three) times daily as needed (for high blood pressure). If BP is >175      . HYDROcodone-acetaminophen (NORCO/VICODIN) 5-325  MG per tablet Take 1 tablet by mouth every 6 (six) hours as needed for moderate pain.       Marland Kitchen insulin glargine (LANTUS) 100 UNIT/ML injection Inject 44 Units into the skin every morning.       . insulin glargine (LANTUS) 100 UNIT/ML injection Inject 4-16 Units into the skin at bedtime as needed (For blood sugar = or >165).      Marland Kitchen loperamide (IMODIUM) 2 MG capsule Take 2 mg by mouth as needed for diarrhea or loose stools.      Marland Kitchen losartan (COZAAR) 100 MG tablet Take 100 mg by mouth 2 (two) times daily.       . Magnesium Oxide 250 MG TABS Take 250 mg by mouth 2 (two) times daily.       . metoprolol (LOPRESSOR) 50 MG tablet Take 25 mg by mouth 2 (two) times daily.       . Multiple Vitamin (MULTIVITAMIN) tablet Take 1 tablet by mouth daily.        . nitroGLYCERIN (NITROSTAT) 0.3 MG SL tablet Place 0.3 mg under the tongue every 5 (five) minutes x 3 doses as needed for chest pain. Last taken on 11/02/12 (took 2 doses and chest pain resolved)      . omeprazole (PRILOSEC) 20 MG capsule Take 20 mg by mouth daily.       . ondansetron (ZOFRAN) 4 MG tablet Take 1 tablet (4 mg total) by mouth 2 (two) times daily as needed. For nausea  20 tablet  0  . oxyCODONE-acetaminophen (PERCOCET/ROXICET) 5-325 MG per tablet Take 1 tablet by mouth every 6 (six) hours as needed for severe pain.      . potassium chloride (K-DUR) 10 MEQ  tablet Take 20 mEq by mouth daily as needed (for excess fluid).      . ranitidine (ZANTAC) 150 MG tablet Take 1 tablet (150 mg total) by mouth 2 (two) times daily.      . ranolazine (RANEXA) 1000 MG SR tablet Take 1 tablet (1,000 mg total) by mouth 2 (two) times daily.  60 tablet  6  . rosuvastatin (CRESTOR) 40 MG tablet Take 40 mg by mouth daily.       Marland Kitchen topiramate (TOPAMAX) 50 MG tablet Take 1 tablet (50 mg total) by mouth 2 (two) times daily.  60 tablet  6  . triamterene-hydrochlorothiazide (DYAZIDE) 37.5-25 MG per capsule Take 1 capsule by mouth every morning.       . vitamin B-12  (CYANOCOBALAMIN) 1000 MCG tablet Take 1,000 mcg by mouth daily.      Marland Kitchen zinc gluconate 50 MG tablet Take 50 mg by mouth daily.      Marland Kitchen zolpidem (AMBIEN) 10 MG tablet Take 10 mg by mouth at bedtime as needed for sleep.        No current facility-administered medications for this visit.    Allergies:   Amoxicillin; Erythromycin; Penicillins; Metformin and related; Tape; and Isosorbide mononitrate   Social History:  The patient  reports that she has never smoked. She has never used smokeless tobacco. She reports that she does not drink alcohol or use illicit drugs.   Family History:  The patient's family history includes Cancer in her sister; Deep vein thrombosis in her father; Diabetes in her father and another family member; Heart attack in her father; Heart disease in her father; Hyperlipidemia in her father; Hypertension in her father and mother.   ROS:  Please see the history of present illness.   She notes occ nausea and diarrhea.   All other systems reviewed and negative.   PHYSICAL EXAM: VS:  BP 130/70  Pulse 54  Ht 5' 2.5" (1.588 m)  Wt 274 lb (124.286 kg)  BMI 49.29 kg/m2 Well nourished, well developed, in no acute distress HEENT: normal Neck: no JVD Cardiac:  normal S1, S2; RRR; no murmur Lungs:  clear to auscultation bilaterally, no wheezing, rhonchi or rales Abd: soft, nontender, no hepatomegaly Ext: very trace bilateral LE edema  Skin: warm and dry Neuro:  CNs 2-12 intact, no focal abnormalities noted  EKG:   Sinus brady, HR 54, LBBB      ASSESSMENT AND PLAN:  1. CAD s/p CABG:   She has continued to have CCS class III angina in the setting of chronically occluded RCA.  She did not tolerate Imdur.  She is bradycardic with her beta blocker and somewhat symptomatic.  I will decrease her Metoprolol to 25 mg bid.  She is scheduled to have repeat CTO PCI of the RCA this week.  Risks and benefits of cardiac catheterization have been discussed with the patient.  These include  bleeding, infection, kidney damage, stroke, heart attack, death.  The patient understands these risks and is willing to proceed.   2. Chronic Diastolic CHF:  Volume appears stable. Continue current therapy. 3. Carotid Stenosis:  Follow up with vascular surgery as planned. 4. Hypertension:  Controlled. 5. Hyperlipidemia:  Continue statin.  Recent labs not optimal.  [facility] and may be enrolled in [facility]. 6. Chronic Kidney Disease:  Repeat BMET today as part of pre-cath work up. 7. Disposition:  Proceed with CTO PCI this week with Dr. Casandra Doffing.   Signed, Nicki Reaper  Kathlen Mody, PA-C  06/11/2013 2:34 PM

## 2013-06-13 ENCOUNTER — Other Ambulatory Visit: Payer: Self-pay | Admitting: Interventional Cardiology

## 2013-06-13 ENCOUNTER — Other Ambulatory Visit: Payer: Self-pay | Admitting: Cardiology

## 2013-06-13 ENCOUNTER — Encounter (HOSPITAL_COMMUNITY)
Admission: RE | Disposition: A | Discharge status: Home / Self Care | Payer: Self-pay | Source: Ambulatory Visit | Attending: Interventional Cardiology

## 2013-06-13 ENCOUNTER — Ambulatory Visit (HOSPITAL_COMMUNITY)
Admission: RE | Admit: 2013-06-13 | Discharge: 2013-06-14 | Disposition: A | Discharge status: Home / Self Care | Payer: Medicare Other | Source: Ambulatory Visit | Attending: Interventional Cardiology | Admitting: Interventional Cardiology

## 2013-06-13 ENCOUNTER — Encounter (HOSPITAL_COMMUNITY): Payer: Self-pay | Admitting: Interventional Cardiology

## 2013-06-13 ENCOUNTER — Other Ambulatory Visit: Payer: Self-pay

## 2013-06-13 DIAGNOSIS — M1710 Unilateral primary osteoarthritis, unspecified knee: Secondary | ICD-10-CM | POA: Diagnosis not present

## 2013-06-13 DIAGNOSIS — Z7982 Long term (current) use of aspirin: Secondary | ICD-10-CM | POA: Diagnosis not present

## 2013-06-13 DIAGNOSIS — K449 Diaphragmatic hernia without obstruction or gangrene: Secondary | ICD-10-CM | POA: Insufficient documentation

## 2013-06-13 DIAGNOSIS — M19049 Primary osteoarthritis, unspecified hand: Secondary | ICD-10-CM | POA: Diagnosis not present

## 2013-06-13 DIAGNOSIS — I200 Unstable angina: Secondary | ICD-10-CM | POA: Insufficient documentation

## 2013-06-13 DIAGNOSIS — I509 Heart failure, unspecified: Secondary | ICD-10-CM | POA: Diagnosis not present

## 2013-06-13 DIAGNOSIS — I2582 Chronic total occlusion of coronary artery: Secondary | ICD-10-CM | POA: Insufficient documentation

## 2013-06-13 DIAGNOSIS — F411 Generalized anxiety disorder: Secondary | ICD-10-CM | POA: Insufficient documentation

## 2013-06-13 DIAGNOSIS — G4733 Obstructive sleep apnea (adult) (pediatric): Secondary | ICD-10-CM | POA: Insufficient documentation

## 2013-06-13 DIAGNOSIS — Z951 Presence of aortocoronary bypass graft: Secondary | ICD-10-CM | POA: Diagnosis not present

## 2013-06-13 DIAGNOSIS — R011 Cardiac murmur, unspecified: Secondary | ICD-10-CM | POA: Insufficient documentation

## 2013-06-13 DIAGNOSIS — I5032 Chronic diastolic (congestive) heart failure: Secondary | ICD-10-CM | POA: Diagnosis not present

## 2013-06-13 DIAGNOSIS — I739 Peripheral vascular disease, unspecified: Secondary | ICD-10-CM | POA: Insufficient documentation

## 2013-06-13 DIAGNOSIS — Z955 Presence of coronary angioplasty implant and graft: Secondary | ICD-10-CM

## 2013-06-13 DIAGNOSIS — J45909 Unspecified asthma, uncomplicated: Secondary | ICD-10-CM | POA: Insufficient documentation

## 2013-06-13 DIAGNOSIS — Z6841 Body Mass Index (BMI) 40.0 and over, adult: Secondary | ICD-10-CM | POA: Insufficient documentation

## 2013-06-13 DIAGNOSIS — Z7902 Long term (current) use of antithrombotics/antiplatelets: Secondary | ICD-10-CM | POA: Insufficient documentation

## 2013-06-13 DIAGNOSIS — M545 Low back pain, unspecified: Secondary | ICD-10-CM | POA: Insufficient documentation

## 2013-06-13 DIAGNOSIS — K219 Gastro-esophageal reflux disease without esophagitis: Secondary | ICD-10-CM | POA: Insufficient documentation

## 2013-06-13 DIAGNOSIS — I2510 Atherosclerotic heart disease of native coronary artery without angina pectoris: Secondary | ICD-10-CM | POA: Diagnosis present

## 2013-06-13 DIAGNOSIS — Z9889 Other specified postprocedural states: Secondary | ICD-10-CM | POA: Insufficient documentation

## 2013-06-13 DIAGNOSIS — I10 Essential (primary) hypertension: Secondary | ICD-10-CM | POA: Diagnosis not present

## 2013-06-13 DIAGNOSIS — E119 Type 2 diabetes mellitus without complications: Secondary | ICD-10-CM | POA: Insufficient documentation

## 2013-06-13 DIAGNOSIS — I447 Left bundle-branch block, unspecified: Secondary | ICD-10-CM | POA: Insufficient documentation

## 2013-06-13 DIAGNOSIS — R0600 Dyspnea, unspecified: Secondary | ICD-10-CM

## 2013-06-13 DIAGNOSIS — Z794 Long term (current) use of insulin: Secondary | ICD-10-CM | POA: Insufficient documentation

## 2013-06-13 DIAGNOSIS — E785 Hyperlipidemia, unspecified: Secondary | ICD-10-CM | POA: Insufficient documentation

## 2013-06-13 DIAGNOSIS — I209 Angina pectoris, unspecified: Secondary | ICD-10-CM | POA: Diagnosis present

## 2013-06-13 DIAGNOSIS — I059 Rheumatic mitral valve disease, unspecified: Secondary | ICD-10-CM | POA: Insufficient documentation

## 2013-06-13 DIAGNOSIS — Z8673 Personal history of transient ischemic attack (TIA), and cerebral infarction without residual deficits: Secondary | ICD-10-CM | POA: Diagnosis not present

## 2013-06-13 DIAGNOSIS — G8929 Other chronic pain: Secondary | ICD-10-CM | POA: Diagnosis not present

## 2013-06-13 HISTORY — PX: PERCUTANEOUS CORONARY STENT INTERVENTION (PCI-S): SHX5485

## 2013-06-13 HISTORY — PX: CORONARY ANGIOPLASTY WITH STENT PLACEMENT: SHX49

## 2013-06-13 HISTORY — DX: Angina pectoris, unspecified: I20.9

## 2013-06-13 LAB — POCT ACTIVATED CLOTTING TIME
Activated Clotting Time: 221 seconds
Activated Clotting Time: 243 seconds
Activated Clotting Time: 254 seconds
Activated Clotting Time: 254 seconds
Activated Clotting Time: 254 seconds
Activated Clotting Time: 287 seconds
Activated Clotting Time: 282 seconds

## 2013-06-13 LAB — GLUCOSE, CAPILLARY
Glucose-Capillary: 196 mg/dL — ABNORMAL HIGH (ref 70–99)
Glucose-Capillary: 120 mg/dL — ABNORMAL HIGH (ref 70–99)
Glucose-Capillary: 172 mg/dL — ABNORMAL HIGH (ref 70–99)
Glucose-Capillary: 168 mg/dL — ABNORMAL HIGH (ref 70–99)

## 2013-06-13 SURGERY — PERCUTANEOUS CORONARY STENT INTERVENTION (PCI-S)
Anesthesia: LOCAL

## 2013-06-13 MED ORDER — ASPIRIN 81 MG PO CHEW
81.0000 mg | CHEWABLE_TABLET | Freq: Every day | ORAL | Status: DC
  Administered 2013-06-14: 10:00:00 81 mg via ORAL
  Filled 2013-06-13: qty 1

## 2013-06-13 MED ORDER — EZETIMIBE 10 MG PO TABS
10.0000 mg | ORAL_TABLET | Freq: Every day | ORAL | Status: DC
  Administered 2013-06-14: 10 mg via ORAL
  Filled 2013-06-13 (×2): qty 1

## 2013-06-13 MED ORDER — HEPARIN (PORCINE) IN NACL 2-0.9 UNIT/ML-% IJ SOLN
INTRAMUSCULAR | Status: AC
  Filled 2013-06-13: qty 500

## 2013-06-13 MED ORDER — CLOPIDOGREL BISULFATE 75 MG PO TABS
75.0000 mg | ORAL_TABLET | Freq: Every day | ORAL | Status: DC
  Administered 2013-06-14: 75 mg via ORAL
  Filled 2013-06-13: qty 1

## 2013-06-13 MED ORDER — LOPERAMIDE HCL 2 MG PO CAPS: 2.0000 mg | ORAL_CAPSULE | ORAL | Status: DC | PRN

## 2013-06-13 MED ORDER — SODIUM CHLORIDE 0.9 % IV SOLN
1.0000 mL/kg/h | INTRAVENOUS | Status: AC
  Administered 2013-06-13: 1 mL/kg/h via INTRAVENOUS

## 2013-06-13 MED ORDER — LIDOCAINE HCL (PF) 1 % IJ SOLN
INTRAMUSCULAR | Status: AC
  Filled 2013-06-13: qty 30

## 2013-06-13 MED ORDER — TOPIRAMATE 25 MG PO TABS
50.0000 mg | ORAL_TABLET | Freq: Two times a day (BID) | ORAL | Status: DC
  Administered 2013-06-14: 10:00:00 50 mg via ORAL
  Filled 2013-06-13 (×4): qty 2

## 2013-06-13 MED ORDER — NITROGLYCERIN 0.2 MG/ML ON CALL CATH LAB
INTRAVENOUS | Status: AC
  Filled 2013-06-13: qty 1

## 2013-06-13 MED ORDER — ZOLPIDEM TARTRATE 5 MG PO TABS: 5.0000 mg | ORAL_TABLET | Freq: Every evening | ORAL | Status: DC | PRN

## 2013-06-13 MED ORDER — SODIUM CHLORIDE 0.9 % IJ SOLN: 3.0000 mL | INTRAMUSCULAR | Status: DC | PRN

## 2013-06-13 MED ORDER — LOSARTAN POTASSIUM 50 MG PO TABS
100.0000 mg | ORAL_TABLET | Freq: Two times a day (BID) | ORAL | Status: DC
  Administered 2013-06-13 – 2013-06-14 (×2): 100 mg via ORAL
  Filled 2013-06-13 (×3): qty 2

## 2013-06-13 MED ORDER — CLOPIDOGREL BISULFATE 300 MG PO TABS
300.0000 mg | ORAL_TABLET | Freq: Once | ORAL | Status: DC
  Filled 2013-06-13: qty 1

## 2013-06-13 MED ORDER — MOMETASONE FURO-FORMOTEROL FUM 100-5 MCG/ACT IN AERO
2.0000 | INHALATION_SPRAY | Freq: Two times a day (BID) | RESPIRATORY_TRACT | Status: DC
  Administered 2013-06-13 – 2013-06-14 (×2): 2 via RESPIRATORY_TRACT
  Filled 2013-06-13 (×2): qty 8.8

## 2013-06-13 MED ORDER — ONDANSETRON HCL 4 MG PO TABS: 4.0000 mg | ORAL_TABLET | Freq: Two times a day (BID) | ORAL | Status: DC | PRN

## 2013-06-13 MED ORDER — ALPRAZOLAM 0.5 MG PO TABS: 0.5000 mg | ORAL_TABLET | Freq: Two times a day (BID) | ORAL | Status: DC | PRN

## 2013-06-13 MED ORDER — NITROGLYCERIN 0.3 MG SL SUBL
0.3000 mg | SUBLINGUAL_TABLET | SUBLINGUAL | Status: DC | PRN
  Filled 2013-06-13: qty 100

## 2013-06-13 MED ORDER — FENTANYL CITRATE 0.05 MG/ML IJ SOLN
INTRAMUSCULAR | Status: AC
  Filled 2013-06-13: qty 2

## 2013-06-13 MED ORDER — TRIAMTERENE-HCTZ 37.5-25 MG PO CAPS
1.0000 | ORAL_CAPSULE | Freq: Every morning | ORAL | Status: DC
  Administered 2013-06-14: 1 via ORAL
  Filled 2013-06-13 (×2): qty 1

## 2013-06-13 MED ORDER — SODIUM CHLORIDE 0.9 % IJ SOLN: 3.0000 mL | Freq: Two times a day (BID) | INTRAMUSCULAR | Status: DC

## 2013-06-13 MED ORDER — ALBUTEROL SULFATE (2.5 MG/3ML) 0.083% IN NEBU: 2.5000 mg | INHALATION_SOLUTION | Freq: Four times a day (QID) | RESPIRATORY_TRACT | Status: DC | PRN

## 2013-06-13 MED ORDER — PANTOPRAZOLE SODIUM 40 MG PO TBEC
40.0000 mg | DELAYED_RELEASE_TABLET | Freq: Every day | ORAL | Status: DC
  Administered 2013-06-14: 40 mg via ORAL
  Filled 2013-06-13: qty 1

## 2013-06-13 MED ORDER — HEPARIN (PORCINE) IN NACL 2-0.9 UNIT/ML-% IJ SOLN
INTRAMUSCULAR | Status: AC
  Filled 2013-06-13: qty 1000

## 2013-06-13 MED ORDER — CLOPIDOGREL BISULFATE 75 MG PO TABS: 75.0000 mg | ORAL_TABLET | Freq: Every day | ORAL | Status: DC

## 2013-06-13 MED ORDER — HYDROCODONE-ACETAMINOPHEN 5-325 MG PO TABS
ORAL_TABLET | ORAL | Status: AC
  Filled 2013-06-13: qty 1

## 2013-06-13 MED ORDER — FAMOTIDINE 10 MG PO TABS
10.0000 mg | ORAL_TABLET | Freq: Every day | ORAL | Status: DC
  Administered 2013-06-13: 10 mg via ORAL
  Filled 2013-06-13 (×2): qty 1

## 2013-06-13 MED ORDER — DIPHENHYDRAMINE HCL 50 MG/ML IJ SOLN
INTRAMUSCULAR | Status: AC
  Filled 2013-06-13: qty 1

## 2013-06-13 MED ORDER — GABAPENTIN 600 MG PO TABS
600.0000 mg | ORAL_TABLET | Freq: Every day | ORAL | Status: DC
  Administered 2013-06-13: 600 mg via ORAL
  Filled 2013-06-13 (×2): qty 1

## 2013-06-13 MED ORDER — HEPARIN SODIUM (PORCINE) 1000 UNIT/ML IJ SOLN
INTRAMUSCULAR | Status: AC
  Filled 2013-06-13: qty 1

## 2013-06-13 MED ORDER — CLONIDINE HCL 0.2 MG PO TABS
0.2000 mg | ORAL_TABLET | Freq: Two times a day (BID) | ORAL | Status: DC
  Administered 2013-06-13 – 2013-06-14 (×2): 0.2 mg via ORAL
  Filled 2013-06-13 (×4): qty 1

## 2013-06-13 MED ORDER — ATORVASTATIN CALCIUM 80 MG PO TABS
80.0000 mg | ORAL_TABLET | Freq: Every day | ORAL | Status: DC
  Administered 2013-06-13: 80 mg via ORAL
  Filled 2013-06-13 (×2): qty 1

## 2013-06-13 MED ORDER — RANOLAZINE ER 500 MG PO TB12
500.0000 mg | ORAL_TABLET | Freq: Two times a day (BID) | ORAL | Status: DC
  Administered 2013-06-14: 500 mg via ORAL
  Filled 2013-06-13 (×3): qty 1

## 2013-06-13 MED ORDER — OXYCODONE-ACETAMINOPHEN 5-325 MG PO TABS
1.0000 | ORAL_TABLET | Freq: Four times a day (QID) | ORAL | Status: DC | PRN
  Administered 2013-06-13: 1 via ORAL
  Filled 2013-06-13: qty 1

## 2013-06-13 MED ORDER — INSULIN LISPRO 100 UNIT/ML (KWIKPEN): 5.0000 [IU] | PEN_INJECTOR | Freq: Two times a day (BID) | SUBCUTANEOUS | Status: DC

## 2013-06-13 MED ORDER — ASPIRIN EC 81 MG PO TBEC: 81.0000 mg | DELAYED_RELEASE_TABLET | Freq: Every day | ORAL | Status: DC

## 2013-06-13 MED ORDER — ASPIRIN 81 MG PO CHEW: 81.0000 mg | CHEWABLE_TABLET | ORAL | Status: DC

## 2013-06-13 MED ORDER — FLUOXETINE HCL 20 MG PO CAPS
20.0000 mg | ORAL_CAPSULE | Freq: Every day | ORAL | Status: DC
  Administered 2013-06-14: 10:00:00 20 mg via ORAL
  Filled 2013-06-13 (×2): qty 1

## 2013-06-13 MED ORDER — AMLODIPINE BESYLATE 10 MG PO TABS
10.0000 mg | ORAL_TABLET | Freq: Every day | ORAL | Status: DC
  Administered 2013-06-14: 10 mg via ORAL
  Filled 2013-06-13 (×2): qty 1

## 2013-06-13 MED ORDER — INSULIN GLARGINE 100 UNIT/ML ~~LOC~~ SOLN
44.0000 [IU] | Freq: Every morning | SUBCUTANEOUS | Status: DC
  Administered 2013-06-14: 44 [IU] via SUBCUTANEOUS
  Filled 2013-06-13 (×2): qty 0.44

## 2013-06-13 MED ORDER — METOPROLOL TARTRATE 25 MG PO TABS
25.0000 mg | ORAL_TABLET | Freq: Two times a day (BID) | ORAL | Status: DC
  Administered 2013-06-13 – 2013-06-14 (×2): 25 mg via ORAL
  Filled 2013-06-13 (×3): qty 1

## 2013-06-13 MED ORDER — POTASSIUM CHLORIDE ER 10 MEQ PO TBCR
20.0000 meq | EXTENDED_RELEASE_TABLET | Freq: Every day | ORAL | Status: DC | PRN
  Filled 2013-06-13: qty 2

## 2013-06-13 MED ORDER — MIDAZOLAM HCL 2 MG/2ML IJ SOLN
INTRAMUSCULAR | Status: AC
  Filled 2013-06-13: qty 2

## 2013-06-13 MED ORDER — ALBUTEROL 90 MCG/ACT IN AERS: 2.0000 | INHALATION_SPRAY | Freq: Four times a day (QID) | RESPIRATORY_TRACT | Status: DC | PRN

## 2013-06-13 MED ORDER — SODIUM CHLORIDE 0.9 % IV SOLN
INTRAVENOUS | Status: DC
  Administered 2013-06-13: 07:00:00 via INTRAVENOUS

## 2013-06-13 MED ORDER — INSULIN GLARGINE 100 UNIT/ML ~~LOC~~ SOLN
8.0000 [IU] | Freq: Every evening | SUBCUTANEOUS | Status: DC | PRN
  Filled 2013-06-13: qty 0.2

## 2013-06-13 MED ORDER — INSULIN GLARGINE 100 UNIT/ML ~~LOC~~ SOLN
8.0000 [IU] | Freq: Once | SUBCUTANEOUS | Status: AC
  Administered 2013-06-13: 23:00:00 8 [IU] via SUBCUTANEOUS
  Filled 2013-06-13: qty 0.08

## 2013-06-13 MED ORDER — HYDRALAZINE HCL 50 MG PO TABS
50.0000 mg | ORAL_TABLET | Freq: Three times a day (TID) | ORAL | Status: DC | PRN
  Filled 2013-06-13: qty 1

## 2013-06-13 MED ORDER — CHOLESTYRAMINE 4 G PO PACK
2.0000 g | PACK | Freq: Two times a day (BID) | ORAL | Status: DC
  Administered 2013-06-13 – 2013-06-14 (×2): 2 g via ORAL
  Filled 2013-06-13 (×4): qty 1

## 2013-06-13 MED ORDER — SODIUM CHLORIDE 0.9 % IV SOLN: 250.0000 mL | INTRAVENOUS | Status: DC | PRN

## 2013-06-13 MED ORDER — HYDROCODONE-ACETAMINOPHEN 5-325 MG PO TABS
1.0000 | ORAL_TABLET | Freq: Four times a day (QID) | ORAL | Status: DC | PRN
  Administered 2013-06-13: 1 via ORAL

## 2013-06-13 MED ORDER — INSULIN ASPART 100 UNIT/ML ~~LOC~~ SOLN
5.0000 [IU] | Freq: Two times a day (BID) | SUBCUTANEOUS | Status: DC
  Administered 2013-06-13: 5 [IU] via SUBCUTANEOUS

## 2013-06-13 NOTE — H&P (View-Only) (Signed)
[address], [address], [place]  [address] Phone: [phone] Fax:  [phone]  Date:  06/11/2013   ID:  [name] [name], DOB August 01, 1945, [name] [id number]  PCP:  Marylene Land, MD  Cardiologist:  Dr. Loralie Champagne     History of Present Illness: [name] [name] is a 68 y.o. female with a hx of CAD s/p CABG, diastolic CHF, and cerebrovascular disease with history of CVA.  She had CABG x 5 in 12/11. Prior to the CABG she had multiple PCIs. She had a CVA in 2/14 that presented as visual loss. She ended up getting a cerebral angiogram in 6/14 with 70% ostial right vertebral stenosis, > 70% stenosis proximal left posterior cerebral artery, and 123456 LICA stenosis. She has been on ASA and Plavix. She recently saw Dr. Scot Dock, and carotid dopplers suggested worsening of LICA stenosis to > [id number]. She has had episodes of transient expressive aphasia for several months. She ultimately underwent L carotid stent by Dr. Trula Slade on 03/20/13.    Given dyspnea and chest pain that was worsened from her baseline, she underwent left heart catheterization in 9/14. This showed patent SVG-Dx, LIMA-LAD, and sequential SVG-OM branches but SVG-RCA and the native RCA were both totally occluded. It was suspect that her increased symptoms coincided with occlusion of SVG-RCA.  She was intolerant of Imdur due to leg cramps. She has been placed on ranolazine and metoprolol.  She was ultimately referred to Dr. Irish Lack for CTO PCI of the RCA.  She underwent successful cutting balloon angioplasty of prox RCA occlusion (reduced to 25%).  Distal RCA disease could not be crossed due to heavy diffuse disease.  There was dissection noted in the distal RCA.  It was felt that after the dissection healed in the mid to dist RCA, there may be a track to complete revascularization of the distal occlusion.    I saw her 05/09/13.  She continued to have CCS Class 3 angina.  I reviewed her case with Dr. Casandra Doffing.  Plan is to proceed  with 2nd attempt at CTO PCI of the RCA this week.  I tried her on low dose Imdur again.  She returns for f/u.  She could not tolerate the Imdur and had to stop it.  She continues to note CCS Class 3 angina.  She notes NYHA Class 2b-3 dyspnea.  No syncope.  She does note some lightheadedness.  She sleeps on several pillows chronically.  No PND.  She notes mild pedal edema.  No weight changes.  No significant cough.     Recent Labs: 09/21/2012: Direct LDL 166.6  02/19/2013: Pro B Natriuretic peptide (BNP) 138.0*  04/25/2013: Creatinine 0.93; Hemoglobin 11.7*; Potassium 4.5  05/09/2013: ALT 18; HDL Cholesterol 42.40; LDL (calc) 127*   Wt Readings from Last 3 Encounters:  06/11/13 274 lb (124.286 kg)  05/17/13 273 lb (123.832 kg)  04/24/13 280 lb 6.8 oz (127.2 kg)     Past Medical History: 1. Diabetic gastroparesis  2. Type II diabetes  3. HTN  4. Morbid obesity  5. CAD: s/p CABG in 12/11 after prior PCIs. LIMA-LAD, SVG-D, seq SVG-OM1 and OM2, SVG-PDA. Adenosine Cardiolite (8/14) with EF 53% and a small reversible apical defect with a medium, partially reversible inferior defect. LHC (9/14) with patent SVG-D, LIMA-LAD (50% distal LAD), and sequential SVG-OM branches; the SVG-RCA and the native RCA were occluded (medical management). Patient underwent cutting balloon angioplasty (3/15) of prox RCA occlusion (reduced to 25%).  Distal RCA  disease could not be crossed due to heavy diffuse disease.  There was dissection noted in the distal RCA. 6. Atypical migraines  7. OSA: Intolerant of CPAP.  8. GERD with hiatal hernia 9. OA  10. Diastolic CHF: Echo ([id number]) with EF 50-55%, grade II diastolic dysfunction, mild-moderate MR. Echo (8/14) with EF 55-60%, grade II diastolic dysfunction, mildly increased aortic valve gradient (mean 12 mmHg) but valve opens well, mild MR and mild RV dilation.  11. CKD 12. Chronic LBBB  13. Anxiety  14. Carotid stenosis: Followed by [facility], [id number] LICA stenosis [id number].  15.  Cerebrovascular disease: CVA 2/14 with right posterior cerebral artery territory ischemic infarction. Cerebral angiogram in 6/14 showed 70% right vertebral ostial stenosis, 123456 LICA stenosis, > [id number] proximal left posterior cerebral artery stenosis, posterior communicating artery aneurysm. Patient has had episodes of transient expressive aphasia. Carotid dopplers ([id number]) showed XX123456 LICA stenosis.  16. Positional vertigo (suspected)   Past Medical History  Diagnosis Date  . Vomiting     persistent  . Asthma   . Ventral hernia     hx of  . GERD (gastroesophageal reflux disease)   . Obesity 01-2010  . Hyperlipidemia   . Hypertension   . Irregular heart beat   . Coronary artery disease   . Heart murmur   . Shortness of breath   . Anxiety   . PONV (postoperative nausea and vomiting)   . Peripheral vascular disease     ? numbness, tingling arms and legs  . Anemia     hx  . H/O hiatal hernia   . Neuromuscular disorder     ?  Marland Kitchen Dysrhythmia   . Mental disorder     hx of confusion  . Chronic kidney disease     frequency  . CHF (congestive heart failure)   . Anginal pain   . Myocardial infarction     04/1999, 02/2000, 01/2005; 2011; 2014  . Pneumonia 2000's    "once"  . Chronic bronchitis     "off and on all the time" (04/24/2013)  . Obstructive sleep apnea     "can't wear machine; I have claustrophobia" (04/24/2013)  . Claustrophobia   . Type II diabetes mellitus   . Migraine     "used to have them really bad; kind of eased up now; down to 1 q couple months" (04/24/2013)  . Headache     "probably 4 days/wk" (04/24/2013)  . Stroke 03/22/12    right side brain; denies residual on 04/24/2013)  . Osteoarthritis     "knees and hands" (04/24/2013)  . Chronic lower back pain     Current Outpatient Prescriptions  Medication Sig Dispense Refill  . albuterol (PROVENTIL) (2.5 MG/3ML) 0.083% nebulizer solution Take 2.5 mg by nebulization every 6 (six) hours as needed for shortness of  breath (when asthma is active). If severe asthma , use q4h around the clock x10 days.  For shortness of breath.      Marland Kitchen albuterol (PROVENTIL,VENTOLIN) 90 MCG/ACT inhaler Inhale 2 puffs into the lungs every 6 (six) hours as needed. For shortness of breath.      . ALPRAZolam (XANAX) 0.5 MG tablet Take 0.5 mg by mouth 2 (two) times daily as needed for anxiety. For anxiety - Patient takes 1 in the morning and 1 in the evening daily. If needed she may take one in the afternoon.      Marland Kitchen amLODipine (NORVASC) 10 MG tablet Take 1 tablet (10 mg total) by mouth daily.  30 tablet  6  . aspirin EC 81 MG tablet Take 81 mg by mouth daily.      . B Complex-C (B-COMPLEX WITH VITAMIN C) tablet Take 1 tablet by mouth daily.      . calcium carbonate 200 MG capsule Take 1,000 mg by mouth daily with breakfast.      . Cholecalciferol (VITAMIN D) 2000 UNITS tablet Take 2,000 Units by mouth daily.      . cholestyramine (QUESTRAN) 4 GM/DOSE powder Take 2 g by mouth 2 (two) times daily with a meal. (mixed in juice or light kool-aid).      . cloNIDine (CATAPRES) 0.2 MG tablet Take 0.2 mg by mouth 2 (two) times daily.      . clopidogrel (PLAVIX) 75 MG tablet Take 1 tablet (75 mg total) by mouth daily.  30 tablet  6  . ezetimibe (ZETIA) 10 MG tablet Take 1 tablet (10 mg total) by mouth daily.  30 tablet  3  . FLUoxetine (PROZAC) 20 MG capsule Take 20 mg by mouth daily.      . Fluticasone-Salmeterol (ADVAIR DISKUS) 250-50 MCG/DOSE AEPB Inhale 1 puff into the lungs every morning. For asthma symptoms      . furosemide (LASIX) 40 MG tablet Take 40 mg by mouth daily.      Marland Kitchen gabapentin (NEURONTIN) 600 MG tablet Take 600 mg by mouth at bedtime.       Marland Kitchen HUMALOG KWIKPEN 100 UNIT/ML SOPN Inject 5 Units into the skin 2 (two) times daily with a meal.       . hydrALAZINE (APRESOLINE) 50 MG tablet Take 50 mg by mouth 3 (three) times daily as needed (for high blood pressure). If BP is >175      . HYDROcodone-acetaminophen (NORCO/VICODIN) 5-325  MG per tablet Take 1 tablet by mouth every 6 (six) hours as needed for moderate pain.       Marland Kitchen insulin glargine (LANTUS) 100 UNIT/ML injection Inject 44 Units into the skin every morning.       . insulin glargine (LANTUS) 100 UNIT/ML injection Inject 4-16 Units into the skin at bedtime as needed (For blood sugar = or >165).      Marland Kitchen loperamide (IMODIUM) 2 MG capsule Take 2 mg by mouth as needed for diarrhea or loose stools.      Marland Kitchen losartan (COZAAR) 100 MG tablet Take 100 mg by mouth 2 (two) times daily.       . Magnesium Oxide 250 MG TABS Take 250 mg by mouth 2 (two) times daily.       . metoprolol (LOPRESSOR) 50 MG tablet Take 25 mg by mouth 2 (two) times daily.       . Multiple Vitamin (MULTIVITAMIN) tablet Take 1 tablet by mouth daily.        . nitroGLYCERIN (NITROSTAT) 0.3 MG SL tablet Place 0.3 mg under the tongue every 5 (five) minutes x 3 doses as needed for chest pain. Last taken on 11/02/12 (took 2 doses and chest pain resolved)      . omeprazole (PRILOSEC) 20 MG capsule Take 20 mg by mouth daily.       . ondansetron (ZOFRAN) 4 MG tablet Take 1 tablet (4 mg total) by mouth 2 (two) times daily as needed. For nausea  20 tablet  0  . oxyCODONE-acetaminophen (PERCOCET/ROXICET) 5-325 MG per tablet Take 1 tablet by mouth every 6 (six) hours as needed for severe pain.      . potassium chloride (K-DUR) 10 MEQ  tablet Take 20 mEq by mouth daily as needed (for excess fluid).      . ranitidine (ZANTAC) 150 MG tablet Take 1 tablet (150 mg total) by mouth 2 (two) times daily.      . ranolazine (RANEXA) 1000 MG SR tablet Take 1 tablet (1,000 mg total) by mouth 2 (two) times daily.  60 tablet  6  . rosuvastatin (CRESTOR) 40 MG tablet Take 40 mg by mouth daily.       Marland Kitchen topiramate (TOPAMAX) 50 MG tablet Take 1 tablet (50 mg total) by mouth 2 (two) times daily.  60 tablet  6  . triamterene-hydrochlorothiazide (DYAZIDE) 37.5-25 MG per capsule Take 1 capsule by mouth every morning.       . vitamin B-12  (CYANOCOBALAMIN) 1000 MCG tablet Take 1,000 mcg by mouth daily.      Marland Kitchen zinc gluconate 50 MG tablet Take 50 mg by mouth daily.      Marland Kitchen zolpidem (AMBIEN) 10 MG tablet Take 10 mg by mouth at bedtime as needed for sleep.        No current facility-administered medications for this visit.    Allergies:   Amoxicillin; Erythromycin; Penicillins; Metformin and related; Tape; and Isosorbide mononitrate   Social History:  The patient  reports that she has never smoked. She has never used smokeless tobacco. She reports that she does not drink alcohol or use illicit drugs.   Family History:  The patient's family history includes Cancer in her sister; Deep vein thrombosis in her father; Diabetes in her father and another family member; Heart attack in her father; Heart disease in her father; Hyperlipidemia in her father; Hypertension in her father and mother.   ROS:  Please see the history of present illness.   She notes occ nausea and diarrhea.   All other systems reviewed and negative.   PHYSICAL EXAM: VS:  BP 130/70  Pulse 54  Ht 5' 2.5" (1.588 m)  Wt 274 lb (124.286 kg)  BMI 49.29 kg/m2 Well nourished, well developed, in no acute distress HEENT: normal Neck: no JVD Cardiac:  normal S1, S2; RRR; no murmur Lungs:  clear to auscultation bilaterally, no wheezing, rhonchi or rales Abd: soft, nontender, no hepatomegaly Ext: very trace bilateral LE edema  Skin: warm and dry Neuro:  CNs 2-12 intact, no focal abnormalities noted  EKG:   Sinus brady, HR 54, LBBB      ASSESSMENT AND PLAN:  1. CAD s/p CABG:   She has continued to have CCS class III angina in the setting of chronically occluded RCA.  She did not tolerate Imdur.  She is bradycardic with her beta blocker and somewhat symptomatic.  I will decrease her Metoprolol to 25 mg bid.  She is scheduled to have repeat CTO PCI of the RCA this week.  Risks and benefits of cardiac catheterization have been discussed with the patient.  These include  bleeding, infection, kidney damage, stroke, heart attack, death.  The patient understands these risks and is willing to proceed.   2. Chronic Diastolic CHF:  Volume appears stable. Continue current therapy. 3. Carotid Stenosis:  Follow up with vascular surgery as planned. 4. Hypertension:  Controlled. 5. Hyperlipidemia:  Continue statin.  Recent labs not optimal.  She [facility] and may be enrolled in [facility]. 6. Chronic Kidney Disease:  Repeat BMET today as part of pre-cath work up. 7. Disposition:  Proceed with CTO PCI this week with Dr. Casandra Doffing.   Signed, Nicki Reaper  Kathlen Mody, PA-C  06/11/2013 2:34 PM

## 2013-06-13 NOTE — Progress Notes (Signed)
Report given to Mickel Baas RN for shift change, bilateral groin continue to have pain somewhat, rt groin transparent clean dry and intact, left groin with pressure dressing clean dry and intact, Hazle Nordmann RN

## 2013-06-13 NOTE — CV Procedure (Addendum)
PROCEDURE:  Selective coronary angiography, PCI RCA.  INDICATIONS:   Class III angina  The risks, benefits, and details of the procedure were explained to the patient.  The patient verbalized understanding and wanted to proceed.  Informed written consent was obtained.   OPERATORS: Casandra Doffing, MD; Peter Martinique, MD  PROCEDURE TECHNIQUE:  After Xylocaine anesthesia a 35F sheath was placed in the right femoral artery with a single anterior needle wall stick.  A picture of the RCA was obtained. A 5 French sheath was placed in the left femoral artery in a similar fashion. Left coronary angiography was done using a Judkins L4 guide catheter. The intervention was performed. Please see below.  A 6 French Angio-Seal was deployed in the left common femoral artery for hemostasis. An 8 [place] Angio-Seal was deployed in the right common femoral artery for hemostasis.   CONTRAST:  Total of 155 cc.  COMPLICATIONS:  None.      ANGIOGRAPHIC DATA:   The left main coronary artery is widely patent.  The left anterior descending artery is severely diseased proximally.  The left circumflex artery is a large vessel. It is patent proximally. The epicardial collaterals which are coming off of the distal circumflex the posterolateral artery had decreased significantly since the last study. There is no significant visualization of the native right system.  The right coronary artery is a large vessel. The proximal stent has significant disease. There is severe disease in the midportion. The distal RCA stent is subtotally occluded. There may be a small channel repair. The posterior lateral artery and the posterior descending artery are small and appear underfilled.  PCI NARRATIVE:  An AL 0.75 guide catheter was used to engage the RCA. A BMW wire was advanced to the mid RCA but would not cross the area disease. This wire was loaded and an over-the-wire balloon. A Fielder XT wire was then used but this would either  select a prior dissection flap or marginal branches. Subsequently a pro-water wire was advanced successfully to the distal stent. The over-the-wire balloon was advanced. We could not get a pro-water wire to cross. Several balloon inflations with a 2.0 x 9 over-the-wire balloon were performed in the mid RCA, hoping to at least treat that disease and improve the flow to the distal vessel. The inflations were to 12 atmospheres. In the process of switching out the balloon for a coursair catheter over a miracle Brothers 3 g wire,, wire position and guide position was lost. Since guide position was suboptimal to start with, we decided to switch to an AL-1 guide catheter.  The AL-1 guide catheter was used to engage the RCA. A pro-water wire along with the coursair catheter was advanced and successfully navigated down into the distal RCA. The coursair catheter was advanced to the distal RCA.The [facility] wire was then advanced into the distal RCA system and appeared free. We then were planning on removing the coursair catheter and advancing a guideliner.  A trap balloon was inflated in the guide catheter. The Corsair catheter was removed successfully. In the process of trying to advance the guideline her, wire position was lost again.  We repeated the process of advancing a pro-water wire with a Corsair catheter. This time, the pro-water did cross into the distal PLA.  A Corsair catheter was advanced through the occlusion in the distal stent. The pro-water wire was removed. A mailman wire was advanced.  The Corsair catheter was removed again with a trap balloon of 2.5  x 12 in the guide catheter. The 2.0 x 15 emergent balloon was used to predilate the entire vessel. Multiple inflations were performed. A 2.25 x 28 Xience drug-eluting stent was deployed in the the RCA and into the proximal posterior lateral artery. The stent lumen was used to dilate the proximal area of the stent up to 18 atmospheres. In overlapping fashion,  a 3.0 x 38 Xience was deployed. The stent balloon was used to dilate the overlap area. A 3.5 x 38 Xience drug-eluting stent was deployed in overlapping fashion. A 3.5 x 12 Xience drug-eluting stent was deployed at the ostium overlapping the prior stent. The entire stented area was post dilated with a 3.75 x 30 noncompliant balloon. The balloon was flared at the ostium as there is stent hanging into the aorta. Several doses of nitroglycerin were given through the guide catheter. There was an excellent angiographic result. There is significant improvement in the flow in the posterior lateral artery after stenting. TIMI-3 flow was restored at the end of the procedure.  IMPRESSIONS:  1. Severe native coronary artery disease 2. Occluded distal right coronary artery.  Mild/moderate disease in the proximal to mid RCA with one focal area of severe stenosis in the mid vessel. The entire RCA was treated with overlapping drug-eluting stents. There were 4 Xience drug-eluting stents. The proximal to mid and part of the distal RCA stented area was post dilated with a 3.75 balloon, dilated to 3.8 mm in diameter. I suspect that the posterolateral and PDA branches will increase in size over time.   RECOMMENDATION:  She will need lifelong dual antiplatelet therapy given the number stents that she has. Certainly, this should not be interrupted for the first year. She'll need aggressive secondary prevention and risk factor modification. She'll be watched overnight.Marland Kitchen

## 2013-06-13 NOTE — Interval H&P Note (Signed)
Cath Lab Visit (complete for each Cath Lab visit)  Clinical Evaluation Leading to the Procedure:   ACS: no  Non-ACS:    Anginal Classification: CCS III  Anti-ischemic medical therapy: Maximal Therapy (2 or more classes of medications)  Non-Invasive Test Results: No non-invasive testing performed  Prior CABG: Previous CABG      History and Physical Interval Note:  06/13/2013 8:43 AM  [name] [name]  has presented today for surgery, with the diagnosis of cp/cad  The various methods of treatment have been discussed with the patient and family. After consideration of risks, benefits and other options for treatment, the patient has consented to  Procedure(s): PERCUTANEOUS CORONARY STENT INTERVENTION (PCI-S) (N/A) as a surgical intervention .  The patient's history has been reviewed, patient examined, no change in status, stable for surgery.  I have reviewed the patient's chart and labs.  Questions were answered to the patient's satisfaction.     [name]

## 2013-06-13 NOTE — Progress Notes (Signed)
Patient assisted on and off bedpan, then noted staining to left groin angioseal transparent dressing, pressure held x 10 minutes then pressure dressing applied, note level 1 area is bruised but soft, no signs of hematoma, will continue to reassess, Hazle Nordmann RN

## 2013-06-14 DIAGNOSIS — I2510 Atherosclerotic heart disease of native coronary artery without angina pectoris: Secondary | ICD-10-CM | POA: Diagnosis not present

## 2013-06-14 LAB — BASIC METABOLIC PANEL
BUN: 29 mg/dL — ABNORMAL HIGH (ref 6–23)
CO2: 23 mEq/L (ref 19–32)
Calcium: 8.6 mg/dL (ref 8.4–10.5)
Chloride: 104 mEq/L (ref 96–112)
Creatinine, Ser: 1.06 mg/dL (ref 0.50–1.10)
GFR calc Af Amer: 62 mL/min — ABNORMAL LOW (ref >90)
GFR calc non Af Amer: 54 mL/min — ABNORMAL LOW (ref >90)
Glucose, Bld: 180 mg/dL — ABNORMAL HIGH (ref 70–99)
Potassium: 5 mEq/L (ref 3.7–5.3)
Sodium: 140 mEq/L (ref 137–147)

## 2013-06-14 LAB — CBC
HCT: 35.3 % — ABNORMAL LOW (ref 36.0–46.0)
Hemoglobin: 11.7 g/dL — ABNORMAL LOW (ref 12.0–15.0)
MCH: 31.6 pg (ref 26.0–34.0)
MCHC: 33.1 g/dL (ref 30.0–36.0)
MCV: 95.4 fL (ref 78.0–100.0)
Platelets: 206 10*3/uL (ref 150–400)
RBC: 3.7 MIL/uL — ABNORMAL LOW (ref 3.87–5.11)
RDW: 12.9 % (ref 11.5–15.5)
WBC: 8.5 10*3/uL (ref 4.0–10.5)

## 2013-06-14 LAB — GLUCOSE, CAPILLARY: Glucose-Capillary: 176 mg/dL — ABNORMAL HIGH (ref 70–99)

## 2013-06-14 MED ORDER — PRASUGREL HCL 10 MG PO TABS: 10.0000 mg | ORAL_TABLET | Freq: Every day | ORAL | Status: DC

## 2013-06-14 MED ORDER — FUROSEMIDE 40 MG PO TABS: 40.0000 mg | ORAL_TABLET | Freq: Every day | ORAL | Status: DC

## 2013-06-14 MED ORDER — TRIAMTERENE-HCTZ 37.5-25 MG PO CAPS: 1.0000 | ORAL_CAPSULE | Freq: Every morning | ORAL | Status: DC

## 2013-06-14 MED ORDER — PRASUGREL HCL 10 MG PO TABS
10.0000 mg | ORAL_TABLET | Freq: Every day | ORAL | Status: DC
  Filled 2013-06-14: qty 1

## 2013-06-14 MED ORDER — PRASUGREL HCL 10 MG PO TABS
10.0000 mg | ORAL_TABLET | Freq: Every day | ORAL | Status: DC
Start: 2013-06-14 — End: 2013-07-04

## 2013-06-14 NOTE — Progress Notes (Signed)
CARDIAC REHAB PHASE I   PRE:  Rate/Rhythm: 65 SR  BP:  Supine: 146/50  Sitting:   Standing:    SaO2:   MODE:  Ambulation: 500 ft   POST:  Rate/Rhythm: 99  BP:  Supine:   Sitting: 177/73  Standing:    SaO2:  0805-0900 Pt walked 500 ft with steady gait. Slight twinge in chest but no CP. Some DOE with walking but this is not new. Tolerated well. Education completed with pt. Pt would like to attend CRP 2 if covered by insurance. Will refer and let pt check with insurance since she just completed program in January. Gave ex ed to begin walking program.   Graylon Good, RN BSN  06/14/2013 8:57 AM

## 2013-06-14 NOTE — Discharge Instructions (Signed)
PLEASE REMEMBER TO BRING ALL OF YOUR MEDICATIONS TO EACH OF YOUR FOLLOW-UP OFFICE VISITS. ° °PLEASE ATTEND ALL SCHEDULED FOLLOW-UP APPOINTMENTS.  ° °Activity: Increase activity slowly as tolerated. You may shower, but no soaking baths (or swimming) for 1 week. No driving for 2 days. No lifting over 5 lbs for 1 week. No sexual activity for 1 week.  ° °You May Return to Work: in 1 week (if applicable) ° °Wound Care: You may wash cath site gently with soap and water. Keep cath site clean and dry. If you notice pain, swelling, bleeding or pus at your cath site, please call [phone]. ° ° ° °Cardiac Cath Site Care °Refer to this sheet in the next few weeks. These instructions provide you with information on caring for yourself after your procedure. Your caregiver may also give you more specific instructions. Your treatment has been planned according to current medical practices, but problems sometimes occur. Call your caregiver if you have any problems or questions after your procedure. °HOME CARE INSTRUCTIONS °· You may shower 24 hours after the procedure. Remove the bandage (dressing) and gently wash the site with plain soap and water. Gently pat the site dry.  °· Do not apply powder or lotion to the site.  °· Do not sit in a bathtub, swimming pool, or whirlpool for 5 to 7 days.  °· No bending, squatting, or lifting anything over 10 pounds (4.5 kg) as directed by your caregiver.  °· Inspect the site at least twice daily.  °· Do not drive home if you are discharged the same day of the procedure. Have someone else drive you.  °· You may drive 24 hours after the procedure unless otherwise instructed by your caregiver.  °What to expect: °· Any bruising will usually fade within 1 to 2 weeks.  °· Blood that collects in the tissue (hematoma) may be painful to the touch. It should usually decrease in size and tenderness within 1 to 2 weeks.  °SEEK IMMEDIATE MEDICAL CARE IF: °· You have unusual pain at the site or down the  affected limb.  °· You have redness, warmth, swelling, or pain at the site.  °· You have drainage (other than a small amount of blood on the dressing).  °· You have chills.  °· You have a fever or persistent symptoms for more than 72 hours.  °· You have a fever and your symptoms suddenly get worse.  °· Your leg becomes pale, cool, tingly, or numb.  °· You have heavy bleeding from the site. Hold pressure on the site.  °Document Released: 02/20/2010 Document Revised: 01/07/2011 Document Reviewed: 02/20/2010 °[employer]® Patient Information ©2012 [employer]. ° °

## 2013-06-14 NOTE — Progress Notes (Signed)
     SUBJECTIVE: NO chest pain or SOB.   BP 160/32  Pulse 65  Temp(Src) 97.5 F (36.4 C) (Oral)  Resp 20  Ht 5' 2.5" (1.588 m)  Wt 270 lb 11.6 oz (122.8 kg)  BMI 48.70 kg/m2  SpO2 97%  Intake/Output Summary (Last 24 hours) at 06/14/13 0700 Last data filed at 06/14/13 [id number]  Gross per 24 hour  Intake 1672.63 ml  Output   2450 ml  Net -777.37 ml    PHYSICAL EXAM General: Well developed, well nourished, in no acute distress. Alert and oriented x 3.  Psych:  Good affect, responds appropriately Neck: No JVD. No masses noted.  Lungs: Clear bilaterally with no wheezes or rhonci noted.  Heart: RRR with no murmurs noted. Abdomen: Bowel sounds are present. Soft, non-tender.  Extremities: No lower extremity edema. Bilateral groin without hematoma.   LABS: Basic Metabolic Panel:  Recent Labs  06/11/13 1456 06/14/13 0413  NA 138 140  K 4.4 5.0  CL 100 104  CO2 29 23  GLUCOSE 174* 180*  BUN 32* 29*  CREATININE 1.3* 1.06  CALCIUM 9.7 8.6   CBC:  Recent Labs  06/11/13 1456 06/14/13 0413  WBC 10.4 8.5  NEUTROABS 7.3  --   HGB 14.1 11.7*  HCT 42.0 35.3*  MCV 95.2 95.4  PLT 275.0 206   Current Meds: . amLODipine  10 mg Oral Daily  . aspirin  81 mg Oral Daily  . atorvastatin  80 mg Oral q1800  . cholestyramine  2 g Oral BID WC  . cloNIDine  0.2 mg Oral BID  . clopidogrel  300 mg Oral Once  . clopidogrel  75 mg Oral Q breakfast  . ezetimibe  10 mg Oral Daily  . famotidine  10 mg Oral QHS  . FLUoxetine  20 mg Oral Daily  . gabapentin  600 mg Oral QHS  . insulin aspart  5 Units Subcutaneous BID WC  . insulin glargine  44 Units Subcutaneous q morning - 10a  . losartan  100 mg Oral BID  . metoprolol tartrate  25 mg Oral BID  . mometasone-formoterol  2 puff Inhalation BID  . pantoprazole  40 mg Oral Daily  . ranolazine  500 mg Oral BID  . topiramate  50 mg Oral BID  . triamterene-hydrochlorothiazide  1 capsule Oral q morning - 10a   ASSESSMENT AND PLAN:  1.  CAD: Pt is s/p successful CTO PCI of the RCA with placement of 4 overlapping drug eluting stents 06/13/13 per Dr. Irish Lack and Dr. [place]. Doing well this am. Will need dual anti-platelet therapy with ASA and Plavix for at least one year but for lifetime if she tolerates. Continue statin, Zetia, beta blocker. Discharge home today and follow up with Dr. Loralie Champagne or Richardson Dopp PA-C in 2-3 weeks.   [name] [name] [name] [name]  5/14/20157:00 AM

## 2013-06-14 NOTE — Discharge Summary (Signed)
See full note from this am. cdm

## 2013-06-14 NOTE — Discharge Summary (Signed)
CARDIOLOGY DISCHARGE SUMMARY   Patient ID: [name] [name] [name]: [id number] DOB/AGE: 03-23-1945 68 y.o.  Admit date: 06/13/2013 Discharge date: 06/14/2013  PCP: Marylene Land, MD Primary Cardiologist: DM  Primary Discharge Diagnosis:  Unstable Angina Secondary Discharge Diagnosis:    Procedures: Cardiac catheterization, coronary arteriogram, PCI of the RCA with 4 Xience drug-eluting stents   Hospital Course: [name] [name] is a 68 y.o. female with a long history of CAD. She was having class 3 angina and referred for cardiac catheterization. She came to the hospital for the procedure on 06/13/2013.  The results are below. She had a CTO of the RCA. This was successfully crossed and stented with a total of 4 DES, details below. She tolerated the procedure well. The sheath was removed without difficulty.  On 05/14, she was seen by Dr. Angelena Form. All data were reviewed. There is the potential for her Prozac to interact with the Plavix and make it less effective, so she was changed to Effient. Her post-procedure labs did not show any critical abnormality. Her BUN is slightly elevated, but this is normal for her. She is on 2 diuretics and these will be held till 05/15. She will be followed as an outpatient.    She was ambulating without chest pain or SOB and considered stable for discharge, to follow up as an outpatient.  Labs:   Lab Results  Component Value Date   WBC 8.5 06/14/2013   HGB 11.7* 06/14/2013   HCT 35.3* 06/14/2013   MCV 95.4 06/14/2013   PLT 206 06/14/2013     Recent Labs Lab 06/14/13 0413  NA 140  K 5.0  CL 104  CO2 23  BUN 29*  CREATININE 1.06  CALCIUM 8.6  GLUCOSE 180*    Recent Labs  06/11/13 1456  INR 1.0      Radiology: No results found.  Cardiac Cath: 06/13/2013 ANGIOGRAPHIC DATA: The left main coronary artery is widely patent.  The left anterior descending artery is severely diseased proximally.  The left circumflex artery is a large  vessel. It is patent proximally. The epicardial collaterals which are coming off of the distal circumflex the posterolateral artery had decreased significantly since the last study. There is no significant visualization of the native right system.  The right coronary artery is a large vessel. The proximal stent has significant disease. There is severe disease in the midportion. The distal RCA stent is subtotally occluded. There may be a small channel repair. The posterior lateral artery and the posterior descending artery are small and appear underfilled. PCI NARRATIVE: An AL 0.75 guide catheter was used to engage the RCA. A BMW wire was advanced to the mid RCA but would not cross the area disease. This wire was loaded and an over-the-wire balloon. A Fielder XT wire was then used but this would either select a prior dissection flap or marginal branches. Subsequently a pro-water wire was advanced successfully to the distal stent. The over-the-wire balloon was advanced. We could not get a pro-water wire to cross. Several balloon inflations with a 2.0 x 9 over-the-wire balloon were performed in the mid RCA, hoping to at least treat that disease and improve the flow to the distal vessel. The inflations were to 12 atmospheres. In the process of switching out the balloon for a coursair catheter over a [employer] 3 g wire,, wire position and guide position was lost. Since guide position was suboptimal to start with, we decided to switch to an AL-1  guide catheter.  The AL-1 guide catheter was used to engage the RCA. A pro-water wire along with the coursair catheter was advanced and successfully navigated down into the distal RCA. The coursair catheter was advanced to the distal RCA.The [facility] wire was then advanced into the distal RCA system and appeared free. We then were planning on removing the coursair catheter and advancing a guideliner. A trap balloon was inflated in the guide catheter. The Corsair catheter  was removed successfully. In the process of trying to advance the guideline her, wire position was lost again.  We repeated the process of advancing a pro-water wire with a Corsair catheter. This time, the pro-water did cross into the distal PLA. A Corsair catheter was advanced through the occlusion in the distal stent. The pro-water wire was removed. A mailman wire was advanced. The Corsair catheter was removed again with a trap balloon of 2.5 x 12 in the guide catheter. The 2.0 x 15 emergent balloon was used to predilate the entire vessel. Multiple inflations were performed. A 2.25 x 28 Xience drug-eluting stent was deployed in the the RCA and into the proximal posterior lateral artery. The stent lumen was used to dilate the proximal area of the stent up to 18 atmospheres. In overlapping fashion, a 3.0 x 38 Xience was deployed. The stent balloon was used to dilate the overlap area. A 3.5 x 38 Xience drug-eluting stent was deployed in overlapping fashion. A 3.5 x 12 Xience drug-eluting stent was deployed at the ostium overlapping the prior stent. The entire stented area was post dilated with a 3.75 x 30 noncompliant balloon. The balloon was flared at the ostium as there is stent hanging into the aorta. Several doses of nitroglycerin were given through the guide catheter. There was an excellent angiographic result. There is significant improvement in the flow in the posterior lateral artery after stenting. TIMI-3 flow was restored at the end of the procedure.  IMPRESSIONS:  1. Severe native coronary artery disease 2. Occluded distal right coronary artery. Mild/moderate disease in the proximal to mid RCA with one focal area of severe stenosis in the mid vessel. The entire RCA was treated with overlapping drug-eluting stents. There were 4 Xience drug-eluting stents. The proximal to mid and part of the distal RCA stented area was post dilated with a 3.75 balloon, dilated to 3.8 mm in diameter. I suspect that the  posterolateral and PDA branches will increase in size over time. RECOMMENDATION: She will need lifelong dual antiplatelet therapy given the number stents that she has. Certainly, this should not be interrupted for the first year. She'll need aggressive secondary prevention and risk factor modification. She'll be watched overnight..   EKG: 06/14/2013 Sinus Bradycardia Vent. rate 59 BPM PR interval 156 ms QRS duration 174 ms QT/QTc 476/471 ms P-R-T axes 59 -13 104  FOLLOW UP PLANS AND APPOINTMENTS Allergies  Allergen Reactions  . Amoxicillin Shortness Of Breath and Rash  . Erythromycin Shortness Of Breath and Other (See Comments)    Trouble swallowing  . Penicillins Shortness Of Breath and Rash  . Metformin And Related     dizziness and nausea  . Tape Other (See Comments)    Plastic tape causes skin to rip if left on for long periods of time  . Isosorbide Mononitrate [Isosorbide]     Joint aches, muscles hurt, difficult to walk     Medication List    STOP taking these medications       clopidogrel 75 MG tablet  Commonly known as:  PLAVIX      TAKE these medications       ADVAIR DISKUS 250-50 MCG/DOSE Aepb  Generic drug:  Fluticasone-Salmeterol  Inhale 1 puff into the lungs every morning. For asthma symptoms     albuterol (2.5 MG/3ML) 0.083% nebulizer solution  Commonly known as:  PROVENTIL  Take 2.5 mg by nebulization every 6 (six) hours as needed for shortness of breath (when asthma is active). If severe asthma , use q4h around the clock x10 days.  For shortness of breath.     albuterol 90 MCG/ACT inhaler  Commonly known as:  PROVENTIL,VENTOLIN  Inhale 2 puffs into the lungs every 6 (six) hours as needed. For shortness of breath.     ALPRAZolam 0.5 MG tablet  Commonly known as:  XANAX  Take 0.5 mg by mouth 2 (two) times daily as needed for anxiety. For anxiety - Patient takes 1 in the morning and 1 in the evening daily. If needed she may take one in the afternoon.       AMBIEN 10 MG tablet  Generic drug:  zolpidem  Take 10 mg by mouth at bedtime as needed for sleep.     amLODipine 10 MG tablet  Commonly known as:  NORVASC  Take 1 tablet (10 mg total) by mouth daily.     aspirin EC 81 MG tablet  Take 81 mg by mouth daily.     B-complex with vitamin C tablet  Take 1 tablet by mouth daily.     calcium carbonate 200 MG capsule  Take 1,000 mg by mouth daily with breakfast.     cholestyramine 4 GM/DOSE powder  Commonly known as:  QUESTRAN  Take 2 g by mouth 2 (two) times daily with a meal. (mixed in juice or light kool-aid).     cloNIDine 0.2 MG tablet  Commonly known as:  CATAPRES  Take 0.2 mg by mouth 2 (two) times daily.     FLUoxetine 20 MG capsule  Commonly known as:  PROZAC  Take 20 mg by mouth daily.     furosemide 40 MG tablet  Commonly known as:  LASIX  Take 1 tablet (40 mg total) by mouth daily.  Start taking on:  06/15/2013     gabapentin 600 MG tablet  Commonly known as:  NEURONTIN  Take 600 mg by mouth at bedtime.     HUMALOG KWIKPEN 100 UNIT/ML KiwkPen  Generic drug:  insulin lispro  Inject 5 Units into the skin 2 (two) times daily with a meal.     hydrALAZINE 50 MG tablet  Commonly known as:  APRESOLINE  Take 50 mg by mouth 3 (three) times daily as needed (for high blood pressure). If BP is >175     HYDROcodone-acetaminophen 5-325 MG per tablet  Commonly known as:  NORCO/VICODIN  Take 1 tablet by mouth every 6 (six) hours as needed for moderate pain.     insulin glargine 100 UNIT/ML injection  Commonly known as:  LANTUS  Inject 44 Units into the skin every morning.     insulin glargine 100 UNIT/ML injection  Commonly known as:  LANTUS  Inject 4-16 Units into the skin at bedtime as needed (For blood sugar = or >165).     loperamide 2 MG capsule  Commonly known as:  IMODIUM  Take 2 mg by mouth as needed for diarrhea or loose stools.     losartan 100 MG tablet  Commonly known as:  COZAAR  Take 100  mg by mouth 2  (two) times daily.     Magnesium Oxide 250 MG Tabs  Take 250 mg by mouth 2 (two) times daily.     metoprolol tartrate 25 MG tablet  Commonly known as:  LOPRESSOR  Take 1 tablet (25 mg total) by mouth 2 (two) times daily.     multivitamin tablet  Take 1 tablet by mouth daily.     nitroGLYCERIN 0.3 MG SL tablet  Commonly known as:  NITROSTAT  Place 0.3 mg under the tongue every 5 (five) minutes x 3 doses as needed for chest pain. Last taken on 11/02/12 (took 2 doses and chest pain resolved)     omeprazole 20 MG capsule  Commonly known as:  PRILOSEC  Take 20 mg by mouth daily.     ondansetron 4 MG tablet  Commonly known as:  ZOFRAN  Take 1 tablet (4 mg total) by mouth 2 (two) times daily as needed. For nausea     oxyCODONE-acetaminophen 5-325 MG per tablet  Commonly known as:  PERCOCET/ROXICET  Take 1 tablet by mouth every 6 (six) hours as needed for severe pain.     potassium chloride 10 MEQ tablet  Commonly known as:  K-DUR  Take 20 mEq by mouth daily as needed (for excess fluid).     prasugrel 10 MG Tabs tablet  Commonly known as:  EFFIENT  Take 1 tablet (10 mg total) by mouth daily.     ranitidine 150 MG tablet  Commonly known as:  ZANTAC  Take 1 tablet (150 mg total) by mouth 2 (two) times daily.     ranolazine 1000 MG SR tablet  Commonly known as:  RANEXA  Take 1 tablet (1,000 mg total) by mouth 2 (two) times daily.     rosuvastatin 40 MG tablet  Commonly known as:  CRESTOR  Take 40 mg by mouth daily.     topiramate 50 MG tablet  Commonly known as:  TOPAMAX  Take 1 tablet (50 mg total) by mouth 2 (two) times daily.     triamterene-hydrochlorothiazide 37.5-25 MG per capsule  Commonly known as:  DYAZIDE  Take 1 each (1 capsule total) by mouth every morning.  Start taking on:  06/15/2013     vitamin B-12 1000 MCG tablet  Commonly known as:  CYANOCOBALAMIN  Take 1,000 mcg by mouth daily.     Vitamin D 2000 UNITS tablet  Take 2,000 Units by mouth daily.      ZETIA 10 MG tablet  Generic drug:  ezetimibe  TAKE ONE (1) TABLET EACH DAY     zinc gluconate 50 MG tablet  Take 50 mg by mouth daily.        Discharge Orders   Future Appointments Provider Department Dept Phone   07/04/2013 8:30 AM Sharmon Revere [place] [facility] [phone]   07/11/2013 10:30 AM Dennie Bible, NP [facility] [phone]   10/29/2013 10:30 AM [facility] [place] [facility] [address] [id number]   10/29/2013 11:40 AM Sharmon Leyden Nickel, NP [facility] [place] [phone]   Future Orders Complete By Expires   Amb Referral to Cardiac Rehabilitation  As directed    Diet - low sodium heart healthy  As directed    Diet Carb Modified  As directed    Increase activity slowly  As directed      Follow-up Information   Follow up with Richardson Dopp, PA-C On 07/04/2013. (at 8:30 am)  Specialty:  Physician Assistant   Contact information:   [id number] [place] [address] [phone]       BRING ALL MEDICATIONS WITH YOU TO FOLLOW UP APPOINTMENTS  Time spent with patient to include physician time: 38 min Signed: Lonn Georgia, PA-C 06/14/2013, 9:47 AM Co-Sign MD

## 2013-06-14 NOTE — Care Management Note (Signed)
    Page 1 of 1   06/14/2013     11:35:55 AM CARE MANAGEMENT NOTE 06/14/2013  Patient:  [name] [name], [name] [name]   Account Number:  [id number]  Date Initiated:  06/14/2013  Documentation initiated by:  [facility]  Subjective/Objective Assessment:   Procedures: Cardiac catheterization, coronary arteriogram, PCI of the RCA with 4 Xience drug-eluting stents. //Home with spouse.     Action/Plan:   Procedures: Cardiac catheterization, coronary arteriogram, PCI of the RCA with 4 Xience drug-eluting stents.// Benefits check for Effient   Anticipated DC Date:  06/14/2013   Anticipated DC Plan:  [place]  CM consult      Choice offered to / List presented to:  C-1 Patient           Status of service:  Completed, signed off Medicare Important Message given?  NA - LOS <3 / Initial given by admissions (If response is "NO", the following Medicare IM given date fields will be blank) Date Medicare IM given:   Date Additional Medicare IM given:    Discharge Disposition:  HOME/SELF CARE  Per UR Regulation:  Reviewed for med. necessity/level of care/duration of stay  If discussed at [place] of Stay Meetings, dates discussed:    Comments:  06/14/13 [place], RN, BSN, [employer] [phone] Spoke with pt at bedside regarding benefits check for Effient.  Pt has brochure with 30 day free card and refill assistance card intact.  Pt utilizes [facility] in [place] for prescription needs.  NCM called pharmacy to confirm availability of medication. Information relayed to pt.  Pt verbalizes importance of filling medication upon discharge.

## 2013-06-18 ENCOUNTER — Telehealth: Payer: Self-pay | Admitting: Cardiology

## 2013-06-18 NOTE — Telephone Encounter (Signed)
New message    Patient has questions effient - side effect since she had cva last feb only effect her vision.

## 2013-06-18 NOTE — Telephone Encounter (Signed)
Advised pt that it was ok to take/continue Effient, and discussed reasonings/stents. Pt verbalized understanding and agreeable to continue medication.

## 2013-06-21 ENCOUNTER — Encounter: Payer: Self-pay | Admitting: Cardiology

## 2013-07-04 ENCOUNTER — Encounter: Payer: Self-pay | Admitting: Physician Assistant

## 2013-07-04 ENCOUNTER — Ambulatory Visit (INDEPENDENT_AMBULATORY_CARE_PROVIDER_SITE_OTHER): Payer: Medicare Other | Admitting: Physician Assistant

## 2013-07-04 VITALS — BP 130/78 | HR 47 | Ht 62.5 in | Wt 270.0 lb

## 2013-07-04 DIAGNOSIS — I509 Heart failure, unspecified: Secondary | ICD-10-CM

## 2013-07-04 DIAGNOSIS — R001 Bradycardia, unspecified: Secondary | ICD-10-CM

## 2013-07-04 DIAGNOSIS — R079 Chest pain, unspecified: Secondary | ICD-10-CM

## 2013-07-04 DIAGNOSIS — I6529 Occlusion and stenosis of unspecified carotid artery: Secondary | ICD-10-CM

## 2013-07-04 DIAGNOSIS — R002 Palpitations: Secondary | ICD-10-CM

## 2013-07-04 DIAGNOSIS — I10 Essential (primary) hypertension: Secondary | ICD-10-CM

## 2013-07-04 DIAGNOSIS — E785 Hyperlipidemia, unspecified: Secondary | ICD-10-CM

## 2013-07-04 DIAGNOSIS — N189 Chronic kidney disease, unspecified: Secondary | ICD-10-CM

## 2013-07-04 DIAGNOSIS — I5032 Chronic diastolic (congestive) heart failure: Secondary | ICD-10-CM

## 2013-07-04 DIAGNOSIS — I498 Other specified cardiac arrhythmias: Secondary | ICD-10-CM

## 2013-07-04 DIAGNOSIS — I209 Angina pectoris, unspecified: Secondary | ICD-10-CM

## 2013-07-04 DIAGNOSIS — I2510 Atherosclerotic heart disease of native coronary artery without angina pectoris: Secondary | ICD-10-CM

## 2013-07-04 MED ORDER — EZETIMIBE 10 MG PO TABS: 10.0000 mg | ORAL_TABLET | Freq: Every day | ORAL | Status: DC

## 2013-07-04 MED ORDER — RANOLAZINE ER 1000 MG PO TB12: 1000.0000 mg | ORAL_TABLET | Freq: Two times a day (BID) | ORAL | Status: DC

## 2013-07-04 MED ORDER — TICAGRELOR 90 MG PO TABS: 90.0000 mg | ORAL_TABLET | Freq: Two times a day (BID) | ORAL | Status: DC

## 2013-07-04 MED ORDER — METOPROLOL TARTRATE 25 MG PO TABS: 12.5000 mg | ORAL_TABLET | Freq: Two times a day (BID) | ORAL | Status: DC

## 2013-07-04 NOTE — Progress Notes (Signed)
Cardiology Office Note   Date:  07/04/2013   ID:  [name] [name], DOB 12-Feb-1945, [name] [id number]  PCP:  Marylene Land, MD  Cardiologist:  Dr. Loralie Champagne     History of Present Illness: [name] [name] is a 68 y.o. female with a hx of CAD s/p CABG, diastolic CHF, and cerebrovascular disease with history of CVA.  She had CABG x 5 in 12/11. Prior to the CABG she had multiple PCIs. She had a CVA in 2/14 that presented as visual loss. She ended up getting a cerebral angiogram in 6/14 with 70% ostial right vertebral stenosis, > 70% stenosis proximal left posterior cerebral artery, and 123456 LICA stenosis. She has been on ASA and Plavix. She recently saw Dr. Scot Dock, and carotid dopplers suggested worsening of LICA stenosis to > 123XX123. She has had episodes of transient expressive aphasia for several months. She ultimately underwent L carotid stent by Dr. Trula Slade on 03/20/13.    Given dyspnea and chest pain that was worsened from her baseline, she underwent left heart catheterization in 9/14. This showed patent SVG-Dx, LIMA-LAD, and sequential SVG-OM branches but SVG-RCA and the native RCA were both totally occluded. It was suspect that her increased symptoms coincided with occlusion of SVG-RCA.  She was intolerant of Imdur due to leg cramps. She has been placed on ranolazine and metoprolol.  She was ultimately referred to Dr. Irish Lack for CTO PCI of the RCA.  She underwent successful cutting balloon angioplasty of prox RCA occlusion (reduced to 25%).  Distal RCA disease could not be crossed due to heavy diffuse disease.  There was dissection noted in the distal RCA.  It was felt that after the dissection healed in the mid to dist RCA, there may be a track to complete revascularization of the distal occlusion.    She ultimately was set up for repeat attempt at CTO PCI of the RCA.  She was admitted 5/13-5/14.  PCI of the RCA was successful and she had placement of Xience DES x 4.  Lifelong DAP therapy  was recommended.  She was changed from Plavix to Effient due to interaction with Prozac.  She returns for follow up.    Her chest discomfort is improved. However, she continues to note NYHA class III dyspnea. She does have chest discomfort with this. She denies jaw pain which is an anginal equivalent. She sleeps on an incline and has done so for the past year. She does note occasional PND. She has chronic LE edema. There has not been significant change. She denies syncope. Her creatinine was apparently increased during a recent office visit with nephrology (Dr. Lorrene Reid). She has repeat basic metabolic panel planned for today. She's noted some palpitations awakening her at night. She denies any associated symptoms. It generally resolves quickly.    Recent Labs: 09/21/2012: Direct LDL 166.6  02/19/2013: Pro B Natriuretic peptide (BNP) 138.0*  05/09/2013: ALT 18; HDL Cholesterol by NMR 42.40; LDL (calc) 127*  06/14/2013: Creatinine 1.06; Hemoglobin 11.7*; Potassium 5.0   Wt Readings from Last 3 Encounters:  07/04/13 270 lb (122.471 kg)  06/14/13 270 lb 11.6 oz (122.8 kg)  06/14/13 270 lb 11.6 oz (122.8 kg)     Past Medical History: 1. Diabetic gastroparesis  2. Type II diabetes  3. HTN  4. Morbid obesity  5. CAD: s/p CABG in 12/11 after prior PCIs. LIMA-LAD, SVG-D, seq SVG-OM1 and OM2, SVG-PDA. Adenosine Cardiolite (8/14) with EF 53% and a small reversible apical defect with a medium,  partially reversible inferior defect. LHC (9/14) with patent SVG-D, LIMA-LAD (50% distal LAD), and sequential SVG-OM branches; the SVG-RCA and the native RCA were occluded (medical management). Patient underwent cutting balloon angioplasty (3/15) of prox RCA occlusion (reduced to 25%).  Distal RCA disease could not be crossed due to heavy diffuse disease.  There was dissection noted in the distal RCA.  CTO PCI of RCA (06/13/13):  Successful with placement of Xience DES x 4 to RCA 6. Atypical migraines  7. OSA:  Intolerant of CPAP.  8. GERD with hiatal hernia 9. OA  10. Diastolic CHF: Echo ([id number]) with EF 50-55%, grade II diastolic dysfunction, mild-moderate MR. Echo (8/14) with EF 55-60%, grade II diastolic dysfunction, mildly increased aortic valve gradient (mean 12 mmHg) but valve opens well, mild MR and mild RV dilation.  11. CKD 12. Chronic LBBB  13. Anxiety  14. Carotid stenosis: Followed by [facility], XX123456 LICA stenosis [id number].  15. Cerebrovascular disease: CVA 2/14 with right posterior cerebral artery territory ischemic infarction. Cerebral angiogram in 6/14 showed 70% right vertebral ostial stenosis, 123456 LICA stenosis, > XX123456 proximal left posterior cerebral artery stenosis, posterior communicating artery aneurysm. Patient has had episodes of transient expressive aphasia. Carotid dopplers ([id number]) showed XX123456 LICA stenosis.  16. Positional vertigo (suspected)   Past Medical History  Diagnosis Date  . Vomiting     persistent  . Asthma   . Ventral hernia     hx of  . GERD (gastroesophageal reflux disease)   . Obesity 01-2010  . Hyperlipidemia   . Hypertension   . Irregular heart beat   . Coronary artery disease   . Heart murmur   . Shortness of breath   . Anxiety   . PONV (postoperative nausea and vomiting)   . Peripheral vascular disease     ? numbness, tingling arms and legs  . Anemia     hx  . H/O hiatal hernia   . Neuromuscular disorder     ?  Marland Kitchen Dysrhythmia   . Mental disorder     hx of confusion  . Chronic kidney disease     frequency  . CHF (congestive heart failure)   . Anginal pain   . Myocardial infarction     04/1999, 02/2000, 01/2005; 2011; 2014  . Pneumonia 2000's    "once"  . Chronic bronchitis     "off and on all the time" (06/13/2013)  . Claustrophobia   . Type II diabetes mellitus   . Chronic lower back pain   . Other and unspecified angina pectoris   . Stroke 03/22/12    right side brain; denies residual on 06/13/2013)  . Obstructive sleep apnea     "can't  wear machine; I have claustrophobia" (06/13/2013)  . Migraine     "used to have them really bad; kind of eased up now; down to 1 q couple months" (06/13/2013)  . Headache     "probably 4 days/wk" (06/13/2013)  . Osteoarthritis     "knees and hands" (06/13/2013)    Current Outpatient Prescriptions  Medication Sig Dispense Refill  . albuterol (PROVENTIL) (2.5 MG/3ML) 0.083% nebulizer solution Take 2.5 mg by nebulization every 6 (six) hours as needed for shortness of breath (when asthma is active). If severe asthma , use q4h around the clock x10 days.  For shortness of breath.      Marland Kitchen albuterol (PROVENTIL,VENTOLIN) 90 MCG/ACT inhaler Inhale 2 puffs into the lungs every 6 (six) hours as needed. For shortness of breath.      Marland Kitchen  ALPRAZolam (XANAX) 0.5 MG tablet Take 0.5 mg by mouth 2 (two) times daily as needed for anxiety. For anxiety - Patient takes 1 in the morning and 1 in the evening daily. If needed she may take one in the afternoon.      Marland Kitchen amLODipine (NORVASC) 10 MG tablet Take 1 tablet (10 mg total) by mouth daily.  30 tablet  6  . aspirin EC 81 MG tablet Take 81 mg by mouth daily.      . B Complex-C (B-COMPLEX WITH VITAMIN C) tablet Take 1 tablet by mouth daily.      . calcium carbonate 200 MG capsule Take 1,000 mg by mouth daily with breakfast.      . Cholecalciferol (VITAMIN D) 2000 UNITS tablet Take 2,000 Units by mouth daily.      . cholestyramine (QUESTRAN) 4 GM/DOSE powder Take 2 g by mouth 2 (two) times daily with a meal. (mixed in juice or light kool-aid).      . cloNIDine (CATAPRES) 0.2 MG tablet Take 0.2 mg by mouth 2 (two) times daily.      Marland Kitchen FLUoxetine (PROZAC) 20 MG capsule Take 20 mg by mouth daily.      . Fluticasone-Salmeterol (ADVAIR DISKUS) 250-50 MCG/DOSE AEPB Inhale 1 puff into the lungs every morning. For asthma symptoms      . furosemide (LASIX) 40 MG tablet Take 1 tablet (40 mg total) by mouth daily.  30 tablet    . gabapentin (NEURONTIN) 600 MG tablet Take 600 mg by mouth  at bedtime.       Marland Kitchen HUMALOG KWIKPEN 100 UNIT/ML SOPN Inject 5 Units into the skin 2 (two) times daily with a meal.       . hydrALAZINE (APRESOLINE) 50 MG tablet Take 50 mg by mouth 3 (three) times daily as needed (for high blood pressure). If BP is >175      . HYDROcodone-acetaminophen (NORCO/VICODIN) 5-325 MG per tablet Take 1 tablet by mouth every 6 (six) hours as needed for moderate pain.       Marland Kitchen insulin glargine (LANTUS) 100 UNIT/ML injection Inject 48 Units into the skin every morning.       . insulin glargine (LANTUS) 100 UNIT/ML injection Inject 4-16 Units into the skin at bedtime as needed (For blood sugar = or >165).      Marland Kitchen loperamide (IMODIUM) 2 MG capsule Take 2 mg by mouth as needed for diarrhea or loose stools.      Marland Kitchen losartan (COZAAR) 100 MG tablet Take 100 mg by mouth 2 (two) times daily.       . Magnesium Oxide 250 MG TABS Take 250 mg by mouth 2 (two) times daily.       . metoprolol tartrate (LOPRESSOR) 25 MG tablet Take 1 tablet (25 mg total) by mouth 2 (two) times daily.  60 tablet  11  . Multiple Vitamin (MULTIVITAMIN) tablet Take 1 tablet by mouth daily.        . nitroGLYCERIN (NITROSTAT) 0.3 MG SL tablet Place 0.3 mg under the tongue every 5 (five) minutes x 3 doses as needed for chest pain. Last taken on 11/02/12 (took 2 doses and chest pain resolved)      . omeprazole (PRILOSEC) 20 MG capsule Take 20 mg by mouth daily.       . ondansetron (ZOFRAN) 4 MG tablet Take 1 tablet (4 mg total) by mouth 2 (two) times daily as needed. For nausea  20 tablet  0  . oxyCODONE-acetaminophen (PERCOCET/ROXICET)  5-325 MG per tablet Take 1 tablet by mouth every 6 (six) hours as needed for severe pain.      . potassium chloride (K-DUR) 10 MEQ tablet Take 20 mEq by mouth daily as needed (for excess fluid).      . prasugrel (EFFIENT) 10 MG TABS tablet Take 1 tablet (10 mg total) by mouth daily.  30 tablet  11  . ranitidine (ZANTAC) 150 MG tablet Take 1 tablet (150 mg total) by mouth 2 (two) times  daily.      . ranolazine (RANEXA) 1000 MG SR tablet Take 1 tablet (1,000 mg total) by mouth 2 (two) times daily.  60 tablet  6  . rosuvastatin (CRESTOR) 40 MG tablet Take 40 mg by mouth daily.       Marland Kitchen topiramate (TOPAMAX) 50 MG tablet Take 1 tablet (50 mg total) by mouth 2 (two) times daily.  60 tablet  6  . triamterene-hydrochlorothiazide (DYAZIDE) 37.5-25 MG per capsule Take 1 each (1 capsule total) by mouth every morning.      . vitamin B-12 (CYANOCOBALAMIN) 1000 MCG tablet Take 1,000 mcg by mouth daily.      Marland Kitchen ZETIA 10 MG tablet TAKE ONE (1) TABLET EACH DAY  30 tablet  0  . zinc gluconate 50 MG tablet Take 50 mg by mouth daily.      Marland Kitchen zolpidem (AMBIEN) 10 MG tablet Take 10 mg by mouth at bedtime as needed for sleep.        No current facility-administered medications for this visit.    Allergies:   Amoxicillin; Erythromycin; Penicillins; Metformin and related; Tape; and Isosorbide mononitrate   Social History:  The patient  reports that she has never smoked. She has never used smokeless tobacco. She reports that she does not drink alcohol or use illicit drugs.   Family History:  The patient's family history includes Cancer in her sister; Deep vein thrombosis in her father; Diabetes in her father and another family member; Heart attack in her father; Heart disease in her father; Hyperlipidemia in her father; Hypertension in her father and mother.   ROS:  Please see the history of present illness.      All other systems reviewed and negative.   PHYSICAL EXAM: VS:  BP 130/78  Pulse 47  Ht 5' 2.5" (1.588 m)  Wt 270 lb (122.471 kg)  BMI 48.57 kg/m2 Well nourished, well developed, in no acute distress HEENT: normal Neck: no JVD Cardiac:  normal S1, S2; RRR; no murmur Lungs:  clear to auscultation bilaterally, no wheezing, rhonchi or rales Abd: soft, nontender, no hepatomegaly Ext:   trace bilateral LE edema R groin without hematoma or bruit; L groin with mod sized hematoma, no  pulsatility or bruit Skin: warm and dry Neuro:  CNs 2-12 intact, no focal abnormalities noted  EKG:   Sinus brady, HR 54, LBBB      ASSESSMENT AND PLAN:  1. CAD s/p CABG with CTO of RCA s/p PCI:   Her symptoms are improved. However, she continues to note some dyspnea with exertion and associated chest discomfort. Question if this will improve over time (as branch vessels increase in size). She also has bradycardia noted on ECG. She has a long history of asthma. She's also significantly obese. Dyspnea may be related to deconditioning as well as asthma.  She has a history of stroke. She should not be on Effient as this is contraindicated.  I reviewed this with the patient today. I will stop her  Effient and place her on Brilinta 90 mg twice a day. 2. Bradycardia: Decrease metoprolol to 12.5 mg twice a day. 3. Palpitations: Etiology not clear. They are occurring nightly. I will place a 48 hour Holter monitor. 4. Chronic Diastolic CHF:  Question if she is somewhat volume overloaded contributing to her dyspnea. I will increase her Lasix to 40 mg twice a day for 2 days then resume 40 mg daily. Obtain results of basic metabolic panel drawn today and repeat a basic metabolic panel in one week. 5. Carotid Stenosis:  Follow up with vascular surgery as planned. 6. Hypertension:  Controlled. 7. Hyperlipidemia:  Continue statin.  Recent labs not optimal.  She has [facility] and may be enrolled in [place]. 8. Chronic Kidney Disease:  Follow renal function as noted above. 9. Disposition:  F/u with Dr. Loralie Champagne in 1 month.    Signed, Versie Starks, MHS 07/04/2013 8:56 AM    [facility] [address], [place], [place]  [address] Phone: [phone]; Fax: [phone]

## 2013-07-04 NOTE — Patient Instructions (Signed)
REFILLS RANEXA AND ZETIA WERE SENT IN TODAY  INCREASE LASIX TO 40 MG TWICE DAILY FOR 2 DAYS; THEN RESUME YOUR CURRENT DOSE OF LASIX  DECREASE METOPROLOL TO 12.5 MG TWICE DAILY (1/2 TABLET TWICE DAILY)  STOP EFFIENT 07/04/13  START BRILINTA 90 MG 1 TABLET TWICE DAILY ; MAKE SURE TO SPACE OUT EVERY 12 HOURS; YOUR 1ST DOSE WILL BE TONIGHT  LAB WORK IN 1 WEEK (BMET)  Your physician has recommended that you wear a 48 HOUR holter monitor. Holter monitors are medical devices that record the heart's electrical activity. Doctors most often use these monitors to diagnose arrhythmias. Arrhythmias are problems with the speed or rhythm of the heartbeat. The monitor is a small, portable device. You can wear one while you do your normal daily activities. This is usually used to diagnose what is causing palpitations/syncope (passing out).  Your physician recommends that you schedule a follow-up appointment in: [place]. [name]

## 2013-07-05 ENCOUNTER — Telehealth (HOSPITAL_COMMUNITY): Payer: Self-pay | Admitting: *Deleted

## 2013-07-05 ENCOUNTER — Encounter (HOSPITAL_COMMUNITY)
Admission: RE | Admit: 2013-07-05 | Discharge: 2013-07-05 | Disposition: A | Discharge status: Home / Self Care | Payer: Medicare Other | Source: Ambulatory Visit | Attending: Interventional Cardiology | Admitting: Interventional Cardiology

## 2013-07-05 DIAGNOSIS — Z5189 Encounter for other specified aftercare: Secondary | ICD-10-CM | POA: Insufficient documentation

## 2013-07-05 DIAGNOSIS — I059 Rheumatic mitral valve disease, unspecified: Secondary | ICD-10-CM | POA: Insufficient documentation

## 2013-07-05 DIAGNOSIS — M629 Disorder of muscle, unspecified: Secondary | ICD-10-CM | POA: Insufficient documentation

## 2013-07-05 DIAGNOSIS — M6289 Other specified disorders of muscle: Secondary | ICD-10-CM | POA: Insufficient documentation

## 2013-07-05 DIAGNOSIS — I200 Unstable angina: Secondary | ICD-10-CM | POA: Insufficient documentation

## 2013-07-05 DIAGNOSIS — I5032 Chronic diastolic (congestive) heart failure: Secondary | ICD-10-CM | POA: Insufficient documentation

## 2013-07-05 DIAGNOSIS — I252 Old myocardial infarction: Secondary | ICD-10-CM | POA: Insufficient documentation

## 2013-07-05 DIAGNOSIS — I2510 Atherosclerotic heart disease of native coronary artery without angina pectoris: Secondary | ICD-10-CM | POA: Insufficient documentation

## 2013-07-05 DIAGNOSIS — R0789 Other chest pain: Secondary | ICD-10-CM | POA: Insufficient documentation

## 2013-07-05 DIAGNOSIS — I6529 Occlusion and stenosis of unspecified carotid artery: Secondary | ICD-10-CM | POA: Insufficient documentation

## 2013-07-05 NOTE — Telephone Encounter (Signed)
Message copied by Rowe Pavy on Thu Jul 05, 2013  1:34 PM ------      Message from: [place], [place] T      Created: Thu Jul 05, 2013 12:42 PM      Regarding: RE: ok to proceed with [place] to continue with rehab per protocol.      Thank you      Richardson Dopp, PA-C        07/05/2013 12:42 PM        ----- Message -----         From: Rowe Pavy, RN         Sent: 07/05/2013   8:56 AM           To: Liliane Shi, PA-C      Subject: ok to proceed with Cardiac Rehab                          Pt in today for orientation s/p stent x 4 RCA on 06/13/13.  Pt remarked that she will pick up holter monitor on Monday and will wear for 48 hours due to "palpitations".              Ok to proceed with exercise on Monday at 9:45 am?  I believe she will pick up her monitor afterwards.  Although she is not exercising today we did place her on the monitor to observe her rhythm.  BBB with no PVC's noted for one hour.             Based upon your assessment, any restrictions?                  Thanks      [facility] RN       ------

## 2013-07-05 NOTE — Progress Notes (Signed)
Cardiac Rehab Medication Review by a Pharmacist  Does the patient  feel that his/her medications are working for him/her?  yes  Has the patient been experiencing any side effects to the medications prescribed?  Yes - patient has had ongoing upset stomach and diarrhea but says have run tests and not found anything  Does the patient measure his/her own blood pressure or blood glucose at home?  yes   Does the patient have any problems obtaining medications due to transportation or finances?   No - but may have some problems in the future do to the donut hole  Understanding of regimen: excellent Understanding of indications: excellent Potential of compliance: excellent  Pharmacist comments: Patient has a good understanding of her extensive medication list and the indications for the medications. She brought her list of medications with her today. She reports she recently switched from Effient to Spink do to her history of a stroke. Patient uses a pill box to maintain her medications at home and reports never missing a dose.   Roderic Palau A. Pincus Badder, PharmD Clinical Pharmacist - Resident Pager: [phone] Pharmacy: [phone] 07/05/2013 8:55 AM

## 2013-07-06 ENCOUNTER — Telehealth: Payer: Self-pay | Admitting: *Deleted

## 2013-07-06 DIAGNOSIS — I10 Essential (primary) hypertension: Secondary | ICD-10-CM

## 2013-07-06 DIAGNOSIS — I5032 Chronic diastolic (congestive) heart failure: Secondary | ICD-10-CM

## 2013-07-06 NOTE — Telephone Encounter (Signed)
pt notified about lab results that were done w/PCP, K+ 5.9, she said PCP gave Rx for Kayexalate yesterday. Pt will have repeat bmet 6/8 when she comes in for monitor, we will then send results to PCP. Pt advised of K+ rich foods to limit/avoid

## 2013-07-09 ENCOUNTER — Telehealth: Payer: Self-pay | Admitting: *Deleted

## 2013-07-09 ENCOUNTER — Other Ambulatory Visit (INDEPENDENT_AMBULATORY_CARE_PROVIDER_SITE_OTHER): Payer: Medicare Other

## 2013-07-09 ENCOUNTER — Encounter (HOSPITAL_COMMUNITY): Payer: Medicare Other

## 2013-07-09 ENCOUNTER — Telehealth: Payer: Self-pay | Admitting: Cardiology

## 2013-07-09 ENCOUNTER — Encounter: Payer: Self-pay | Admitting: *Deleted

## 2013-07-09 ENCOUNTER — Encounter (HOSPITAL_COMMUNITY)
Admission: RE | Admit: 2013-07-09 | Discharge: 2013-07-09 | Disposition: A | Discharge status: Home / Self Care | Payer: Medicare Other | Source: Ambulatory Visit | Attending: Interventional Cardiology | Admitting: Interventional Cardiology

## 2013-07-09 ENCOUNTER — Encounter (INDEPENDENT_AMBULATORY_CARE_PROVIDER_SITE_OTHER): Payer: Medicare Other

## 2013-07-09 DIAGNOSIS — I509 Heart failure, unspecified: Secondary | ICD-10-CM

## 2013-07-09 DIAGNOSIS — R002 Palpitations: Secondary | ICD-10-CM

## 2013-07-09 DIAGNOSIS — I10 Essential (primary) hypertension: Secondary | ICD-10-CM

## 2013-07-09 DIAGNOSIS — I5032 Chronic diastolic (congestive) heart failure: Secondary | ICD-10-CM

## 2013-07-09 LAB — BASIC METABOLIC PANEL
BUN: 29 mg/dL — ABNORMAL HIGH (ref 6–23)
CO2: 30 mEq/L (ref 19–32)
Calcium: 9.5 mg/dL (ref 8.4–10.5)
Chloride: 102 mEq/L (ref 96–112)
Creatinine, Ser: 1.3 mg/dL — ABNORMAL HIGH (ref 0.4–1.2)
GFR: 44.8 mL/min — ABNORMAL LOW (ref >60.00)
Glucose, Bld: 155 mg/dL — ABNORMAL HIGH (ref 70–99)
Potassium: 4.4 mEq/L (ref 3.5–5.1)
Sodium: 139 mEq/L (ref 135–145)

## 2013-07-09 NOTE — Progress Notes (Signed)
Patient ID: [name] [name], female   DOB: December 28, 1945, 68 y.o.   [name]: [id number] EVO 48 hour holter monitor applied to patient.

## 2013-07-09 NOTE — Telephone Encounter (Signed)
pt notified about lab results with verbal understanding , will have bmet 6/22

## 2013-07-09 NOTE — Progress Notes (Signed)
Nutrition Note Spoke with pt. Pt reports hyperkalemia. Pt educated re: low potassium diet. Handout given for: Potassium content of foods and a list of low potassium foods. Pt concerned re: "a low potassium, low-fat, diabetic diet." Pt diet discussed. Pt informed to call her MD if CBG's are elevated while following a low potassium diet. Pt expressed understanding of the information reviewed. Continue client-centered nutrition education by RD as part of interdisciplinary care.  Monitor and evaluate progress toward nutrition goal with team.  Derek Mound, M.Ed, RD, LDN, CDE 07/09/2013 1:28 PM

## 2013-07-09 NOTE — Telephone Encounter (Signed)
Spoke with with Kieth Brightly at Dr. Renato Battles dentist regarding pt wether or not he needs antibiotics prior dental cleaning. Pt had stent placement 3 weeks ago. Kieth Brightly is aware that Per Cecille Rubin gerhardt NP: pt needs to wait 6 weeks for dental cleaning. Pt does not needs antibiotics for dental cleaning. Kieth Brightly states will let pt know.

## 2013-07-09 NOTE — Progress Notes (Addendum)
[name] [name] notified me that [name] [name] potassium is 5.9. I advised [name] [name] not to exercise today until [name] [name] lab recheck. [name] [name] told me that she feels depressed but denies feeling suicidal. Emotional support provided. Dr [employer] office called and notified. Dr Deboraha Sprang office spoke to [name] [name] to make appointment to see [name] [name] today. [name] left cardiac rehab to go Dr Claris Gladden office to get [name] [name] potassium level rechecked and to have a holter monitor applied.

## 2013-07-09 NOTE — Telephone Encounter (Signed)
New message      Talk to our nurse regarding this patient.  She would not tell me what she wanted

## 2013-07-10 ENCOUNTER — Telehealth (HOSPITAL_COMMUNITY): Payer: Self-pay | Admitting: *Deleted

## 2013-07-11 ENCOUNTER — Encounter: Payer: Self-pay | Admitting: Nurse Practitioner

## 2013-07-11 ENCOUNTER — Encounter (HOSPITAL_COMMUNITY): Payer: Medicare Other

## 2013-07-11 ENCOUNTER — Ambulatory Visit: Payer: Medicare Other | Admitting: Nurse Practitioner

## 2013-07-11 ENCOUNTER — Ambulatory Visit (INDEPENDENT_AMBULATORY_CARE_PROVIDER_SITE_OTHER): Payer: Medicare Other | Admitting: Nurse Practitioner

## 2013-07-11 ENCOUNTER — Other Ambulatory Visit: Payer: Medicare Other

## 2013-07-11 VITALS — BP 114/64 | HR 52 | Ht 63.0 in | Wt 271.0 lb

## 2013-07-11 DIAGNOSIS — I6350 Cerebral infarction due to unspecified occlusion or stenosis of unspecified cerebral artery: Secondary | ICD-10-CM

## 2013-07-11 DIAGNOSIS — I679 Cerebrovascular disease, unspecified: Secondary | ICD-10-CM

## 2013-07-11 DIAGNOSIS — I209 Angina pectoris, unspecified: Secondary | ICD-10-CM

## 2013-07-11 NOTE — Progress Notes (Signed)
I have read the note, and I agree with the clinical assessment and plan.  Abdulkadir Emmanuel KEITH   

## 2013-07-11 NOTE — Patient Instructions (Signed)
Continue Brilinta for secondary stroke prevention along with aspirin Continue Topamax for headaches will refill Followup with Dr. Gwenette Greet regarding sleep apnea Followup with Dr. Delbert Phenix Re: aneurysm  Healthy diet and slow  weight reduction along with cardiac rehabilitation Followup in 6 months next visit with Dr. Jannifer Franklin

## 2013-07-11 NOTE — Progress Notes (Signed)
[facility]  PATIENT: [name] [name] DOB: [birth year]   REASON FOR VISIT: Followup for history of stroke    HISTORY OF PRESENT ILLNESS: [name] [name], 68 year old female returns for followup. She has a history of stroke in February 2014 associated with generalized blurring of vision in the right and left visual fields. She had no speech changes. MRI of the brain confirmed a right posterior cerebral artery distribution stroke, she is currently on aspirin and Brilinta. and denies further stroke like symptoms.Cerebral angiogram June 2014, reveals 70% right vertebral stenosis, 70% left post cerebral stenosis and [id number] LICA stenosis and post comm artery aneurysm. Dr. Estanislado [name] is following this. She had coronary angioplasty with 4 stents on 06/13/2013. She starts her cardiac rehabilitation tomorrow. She has also had admission for unstable angina since last seen. She denies further stroke or TIA symptoms on followup visit today. She also reports history of sleep apnea however was unable to tolerate the mask. She has seen Dr. Gwenette [name]. She returns for reevaluation  HISTORY: She has a history of obesity, migraine headache, diabetes, and hypertension. The patient has a lifelong history of classic migraine associated with posterior headache and visual disturbances. The patient has had episodes of mutism and difficulty with understanding and generating language with several of her headaches. The patient has been seen through this office previously for evaluation of these headaches. The patient has continued to get her headaches on occasion. The patient developed a headache in February that was associated with generalized blurring of vision in the right and left visual fields. The patient did not have speech changes with this. The patient has not had any improvement in the vision, and she went to see her ophthalmologist. The patient was eventually referred for MRI evaluation of the brain, and this  confirmed a right posterior cerebral artery distribution stroke. Around the time of onset of symptoms, the patient was running blood sugars in the 400 range. The patient had a MRA that showed some distal stenosis of the right posterior cerebral artery, and some stenosis that was previously documented in the left P1 segment of the posterior cerebral artery. The patient continues to note generalized blurring of vision, and difficulty with focusing on things but she thinks it is some better. She denies any numbness or weakness of the arms, legs, or face. The patient denies any significant change in balance. The patient has not had any problems with speech or swallowing. The patient does have a headache in retro-orbital areas and back of the head. The patient has recently undergone a 2-D echocardiogram in November 2013 with an ejection fraction of 50-55%. The patient has been followed by Dr. Doren [name] with the carotid Doppler studies the last study was done in November 2013. The patient has 60-79% stenosis of the left internal carotid artery. No significant stenosis was seen on the right. She has another appt this week for repeat dopplers. The patient is on aspirin and Plavix. Her blood pressure is noted to be high today and in retaking it dropped to 0000000 systolic. Apparently it has been difficult to control in the past.    REVIEW OF SYSTEMS: Full 14 system review of systems performed and notable only for those listed, all others are neg:  Constitutional: Fatigue  Cardiovascular: Chest pain palpitations Ear/Nose/Throat: N/A  Skin: N/A  Eyes:  blurred vision Respiratory: N/A  Gastroitestinal: Nausea , diarrhea Hematology/Lymphatic: N/A  Endocrine: N/A Musculoskeletal:N/A  Allergy/Immunology: N/A  Neurological: Weakness, headache Psychiatric: Depression anxiety  Sleep :  Obstructive sleep apnea, does not use CPAP   ALLERGIES: Allergies  Allergen Reactions  . Amoxicillin Shortness Of Breath and Rash  .  Erythromycin Shortness Of Breath and Other (See Comments)    Trouble swallowing  . Penicillins Shortness Of Breath and Rash  . Metformin And Related     Stomach pain, cold sweats, joint pain, burred vision, dizziness  . Tape Other (See Comments)    Plastic tape causes skin to rip if left on for long periods of time  . Isosorbide Mononitrate [Isosorbide]     Joint aches, muscles hurt, difficult to walk    HOME MEDICATIONS: Outpatient Prescriptions Prior to Visit  Medication Sig Dispense Refill  . albuterol (PROVENTIL) (2.5 MG/3ML) 0.083% nebulizer solution Take 2.5 mg by nebulization every 6 (six) hours as needed for shortness of breath (when asthma is active).       Marland Kitchen albuterol (PROVENTIL,VENTOLIN) 90 MCG/ACT inhaler Inhale 2 puffs into the lungs every 6 (six) hours as needed for shortness of breath.       . ALPRAZolam (XANAX) 0.5 MG tablet Take 0.5 mg by mouth 2 (two) times daily as needed for anxiety. For anxiety - Patient takes 1 in the morning and 1 in the evening daily. If needed she may take one in the afternoon.      Marland Kitchen amLODipine (NORVASC) 10 MG tablet Take 1 tablet (10 mg total) by mouth daily.  30 tablet  6  . aspirin EC 81 MG tablet Take 81 mg by mouth daily.      . B Complex-C (B-COMPLEX WITH VITAMIN C) tablet Take 1 tablet by mouth daily.      . calcium citrate (CALCITRATE - DOSED IN MG ELEMENTAL CALCIUM) 950 MG tablet Take 200 mg of elemental calcium by mouth daily.      . Cholecalciferol (VITAMIN D) 2000 UNITS tablet Take 2,000 Units by mouth daily.      . cholestyramine (QUESTRAN) 4 GM/DOSE powder Take 2 g by mouth 2 (two) times daily with a meal. (mixed in juice or light kool-aid).      . cloNIDine (CATAPRES) 0.2 MG tablet Take 0.2 mg by mouth 2 (two) times daily.      Marland Kitchen ezetimibe (ZETIA) 10 MG tablet Take 1 tablet (10 mg total) by mouth daily.  30 tablet  11  . FLUoxetine (PROZAC) 20 MG capsule Take 20 mg by mouth daily.      . Fluticasone-Salmeterol (ADVAIR DISKUS) 250-50  MCG/DOSE AEPB Inhale 1 puff into the lungs every 12 (twelve) hours. For asthma symptoms      . furosemide (LASIX) 40 MG tablet Take 40 mg by mouth every morning.      . furosemide (LASIX) 40 MG tablet Take 20 mg by mouth every evening. May take whole tablet (40mg ) in PM if needed      . gabapentin (NEURONTIN) 600 MG tablet Take 600 mg by mouth at bedtime.       Marland Kitchen HUMALOG KWIKPEN 100 UNIT/ML SOPN Inject 5 Units into the skin 2 (two) times daily with a meal.       . hydrALAZINE (APRESOLINE) 50 MG tablet Take 50 mg by mouth 3 (three) times daily as needed (for high blood pressure). If BP is >175      . HYDROcodone-acetaminophen (NORCO/VICODIN) 5-325 MG per tablet Take 1 tablet by mouth every evening.       . insulin glargine (LANTUS) 100 UNIT/ML injection Inject 48 Units into the skin every morning.       Marland Kitchen  insulin glargine (LANTUS) 100 UNIT/ML injection Inject 4-16 Units into the skin at bedtime as needed (For blood sugar = or >165).      Marland Kitchen ipratropium-albuterol (DUONEB) 0.5-2.5 (3) MG/3ML SOLN Take 3 mLs by nebulization as needed (If severe asthma , use q4h around the clock x10 days.).      Marland Kitchen loperamide (IMODIUM) 2 MG capsule Take 2 mg by mouth as needed for diarrhea or loose stools.      Marland Kitchen losartan (COZAAR) 100 MG tablet Take 100 mg by mouth 2 (two) times daily.       . Magnesium Oxide 250 MG TABS Take 250 mg by mouth 2 (two) times daily.       . metoprolol tartrate (LOPRESSOR) 25 MG tablet Take 25 mg by mouth 2 (two) times daily.      . Multiple Vitamin (MULTIVITAMIN) tablet Take 1 tablet by mouth daily.        . nitroGLYCERIN (NITROSTAT) 0.3 MG SL tablet Place 0.3 mg under the tongue every 5 (five) minutes x 3 doses as needed for chest pain. Last taken on 11/02/12 (took 2 doses and chest pain resolved)      . omeprazole (PRILOSEC) 20 MG capsule Take 20 mg by mouth daily.       . ondansetron (ZOFRAN) 4 MG tablet Take 1 tablet (4 mg total) by mouth 2 (two) times daily as needed. For nausea  20 tablet   0  . oxyCODONE-acetaminophen (PERCOCET/ROXICET) 5-325 MG per tablet Take 1 tablet by mouth every 6 (six) hours as needed for severe pain.      . ranolazine (RANEXA) 1000 MG SR tablet Take 1 tablet (1,000 mg total) by mouth 2 (two) times daily.  60 tablet  11  . rosuvastatin (CRESTOR) 40 MG tablet Take 40 mg by mouth daily.       . ticagrelor (BRILINTA) 90 MG TABS tablet Take 1 tablet (90 mg total) by mouth 2 (two) times daily.  60 tablet  11  . topiramate (TOPAMAX) 50 MG tablet Take 1 tablet (50 mg total) by mouth 2 (two) times daily.  60 tablet  6  . triamterene-hydrochlorothiazide (DYAZIDE) 37.5-25 MG per capsule Take 1 capsule by mouth daily as needed (For extra fluid).      . vitamin B-12 (CYANOCOBALAMIN) 1000 MCG tablet Take 1,000 mcg by mouth daily.      Marland Kitchen zinc gluconate 50 MG tablet Take 50 mg by mouth daily.      Marland Kitchen zolpidem (AMBIEN) 10 MG tablet Take 10 mg by mouth at bedtime as needed for sleep.       . potassium chloride (K-DUR) 10 MEQ tablet Take 20 mEq by mouth daily as needed (for excess fluid).       No facility-administered medications prior to visit.    PAST MEDICAL HISTORY: Past Medical History  Diagnosis Date  . Vomiting     persistent  . Asthma   . Ventral hernia     hx of  . GERD (gastroesophageal reflux disease)   . Obesity 01-2010  . Hyperlipidemia   . Hypertension   . Irregular heart beat   . Coronary artery disease   . Heart murmur   . Shortness of breath   . Anxiety   . PONV (postoperative nausea and vomiting)   . Peripheral vascular disease     ? numbness, tingling arms and legs  . Anemia     hx  . H/O hiatal hernia   . Neuromuscular disorder     ?  Marland Kitchen  Dysrhythmia   . Mental disorder     hx of confusion  . Chronic kidney disease     frequency  . CHF (congestive heart failure)   . Anginal pain   . Myocardial infarction     04/1999, 02/2000, 01/2005; 2011; 2014  . Pneumonia 2000's    "once"  . Chronic bronchitis     "off and on all the time"  (06/13/2013)  . Claustrophobia   . Type II diabetes mellitus   . Chronic lower back pain   . Other and unspecified angina pectoris   . Stroke 03/22/12    right side brain; denies residual on 06/13/2013)  . Obstructive sleep apnea     "can't wear machine; I have claustrophobia" (06/13/2013)  . Migraine     "used to have them really bad; kind of eased up now; down to 1 q couple months" (06/13/2013)  . Headache     "probably 4 days/wk" (06/13/2013)  . Osteoarthritis     "knees and hands" (06/13/2013)    PAST SURGICAL HISTORY: Past Surgical History  Procedure Laterality Date  . Tumor removal  1970's    ovarian  . Tibia fracture surgery Right 1970's    rods and pins  . Gastric bypass  1984    "stapeling"  . Total abdominal hysterectomy  1970's  . Ankle fracture surgery Left 1970's  . Fracture surgery    . Esophagogastroduodenoscopy  08/03/2011    Procedure: ESOPHAGOGASTRODUODENOSCOPY (EGD);  Surgeon: Shann Medal, MD;  Location: [facility];  Service: General;  Laterality: N/A;  . Gall stone removal  05/2003  . Root canal  10/2000  . Left heart cath  10/10/12    Dr [name].  . Coronary artery bypass graft  1220/11    "CABG X5"  . Appendectomy  1970's    w/hysterectomy  . Cholecystectomy  ?1980's  . Hernia repair  ?75's X2    "in my stomach; had OR on it twice" (04/24/2013)  . Coronary angioplasty with stent placement  01,02,05,06,07,08,11; 04/24/2013    "I've probably got ~ 10 stents by now" (04/24/2013)  . Coronary angioplasty with stent placement  06/13/2013    "got 4 stents today" (06/13/2013)    FAMILY HISTORY: Family History  Problem Relation Age of Onset  . Heart disease Father     Heart Disease before age 55  . Diabetes Father   . Hyperlipidemia Father   . Hypertension Father   . Heart attack Father   . Deep vein thrombosis Father   . Diabetes      family hx of paternal grandmother, aunt and grandfather- uncles  . Hypertension Mother   . Cancer Sister     SOCIAL  HISTORY: History   Social History  . Marital Status: Married    Spouse Name: [name]     Number of Children: 1  . Years of Education: 12+   Occupational History  . retired    Social History Main Topics  . Smoking status: Never Smoker   . Smokeless tobacco: Never Used  . Alcohol Use: No  . Drug Use: No  . Sexual Activity: Yes    Birth Control/ Protection: Surgical     Comment: hysterectomy   Other Topics Concern  . Not on file   Social History Narrative   Patient lives at home with husband [name].    Patient has 1 child.    Patient is not currently working.    Patient has 2 years of college.  PHYSICAL EXAM  Filed Vitals:   07/11/13 1119  BP: 114/64  Pulse: 52  Height: 5\' 3"  (1.6 m)  Weight: 271 lb (122.925 kg)   Body mass index is 48.02 kg/(m^2).  Generalized: Well developed, morbidly obese female in no acute distress  Head: normocephalic and atraumatic,. Oropharynx benign  Neck: Supple, no carotid bruits  Cardiac: Regular rate rhythm, no murmur  Musculoskeletal: No deformity   Neurological examination   Mentation: Alert oriented to time, place, history taking. Follows all commands speech and language fluent  Cranial nerve II-XII: Fundoscopic exam reveals sharp disc margins.Pupils were equal round reactive to light extraocular movements were full, visual field were full on confrontational test. Facial sensation and strength were normal. hearing was intact to finger rubbing bilaterally. Uvula tongue midline. head turning and shoulder shrug were normal and symmetric.Tongue protrusion into cheek strength was normal. Motor: normal bulk and tone, full strength in the BUE, BLE, fine finger movements normal, no pronator drift. No focal weakness Coordination: finger-nose-finger, heel-to-shin bilaterally, no dysmetria Reflexes: Brachioradialis 2/2, biceps 2/2, triceps 2/2, patellar 2/2, Achilles 1/1, plantar responses were flexor bilaterally. Gait and Station: Rising  up from seated position without assistance, normal stance,  moderate stride, good arm swing, smooth turning, able to perform tiptoe, and heel walking without difficulty. Tandem gait is unsteady  DIAGNOSTIC DATA (LABS, IMAGING, TESTING) - I reviewed patient records, labs, notes, testing and imaging myself where available.  Lab Results  Component Value Date   WBC 8.5 06/14/2013   HGB 11.7* 06/14/2013   HCT 35.3* 06/14/2013   MCV 95.4 06/14/2013   PLT 206 06/14/2013      Component Value Date/Time   NA 139 07/09/2013 1122   K 4.4 07/09/2013 1122   CL 102 07/09/2013 1122   CO2 30 07/09/2013 1122   GLUCOSE 155* 07/09/2013 1122   BUN 29* 07/09/2013 1122   CREATININE 1.3* 07/09/2013 1122   CALCIUM 9.5 07/09/2013 1122   PROT 7.1 05/09/2013 0954   ALBUMIN 3.7 05/09/2013 0954   AST 20 05/09/2013 0954   ALT 18 05/09/2013 0954   ALKPHOS 64 05/09/2013 0954   BILITOT 0.9 05/09/2013 0954   GFRNONAA 54* 06/14/2013 0413   GFRAA 62* 06/14/2013 0413   Lab Results  Component Value Date   CHOL 197 05/09/2013   HDL 42.40 05/09/2013   LDLCALC 127* 05/09/2013   LDLDIRECT 166.6 09/21/2012   TRIG 140.0 05/09/2013   CHOLHDL 5 05/09/2013     ASSESSMENT AND PLAN  68 y.o. year old female  has a past medical history of  Asthma;  Obesity (01-2010); Hyperlipidemia; Hypertension; Irregular heart beat; Coronary artery disease;  Peripheral vascular disease;  Chronic kidney disease; CHF (congestive heart failure); Anginal pain; Myocardial infarction; Stroke (03/22/12); Obstructive sleep apnea; Migraine; Headache; here to followup.  Continue Brilinta for secondary stroke prevention along with aspirin Continue Topamax for headaches will refill Followup with Dr. Gwenette [name] regarding sleep apnea Followup with Dr. Delbert Phenix Re: aneurysm  Healthy diet and slow  weight reduction along with cardiac rehabilitation Followup in 6 months next visit with Dr. Jerline Pain, [facility], [facility], [facility] [address], [place], [place] [address] [phone]

## 2013-07-13 ENCOUNTER — Encounter (HOSPITAL_COMMUNITY): Payer: Medicare Other

## 2013-07-13 ENCOUNTER — Encounter (HOSPITAL_COMMUNITY)
Admission: RE | Admit: 2013-07-13 | Discharge: 2013-07-13 | Disposition: A | Discharge status: Home / Self Care | Payer: Medicare Other | Source: Ambulatory Visit | Attending: Interventional Cardiology | Admitting: Interventional Cardiology

## 2013-07-13 DIAGNOSIS — I252 Old myocardial infarction: Secondary | ICD-10-CM | POA: Diagnosis not present

## 2013-07-13 DIAGNOSIS — I5032 Chronic diastolic (congestive) heart failure: Secondary | ICD-10-CM | POA: Diagnosis not present

## 2013-07-13 DIAGNOSIS — M629 Disorder of muscle, unspecified: Secondary | ICD-10-CM | POA: Diagnosis not present

## 2013-07-13 DIAGNOSIS — I200 Unstable angina: Secondary | ICD-10-CM | POA: Diagnosis not present

## 2013-07-13 DIAGNOSIS — R0789 Other chest pain: Secondary | ICD-10-CM | POA: Diagnosis not present

## 2013-07-13 DIAGNOSIS — I2510 Atherosclerotic heart disease of native coronary artery without angina pectoris: Secondary | ICD-10-CM | POA: Diagnosis present

## 2013-07-13 DIAGNOSIS — M6289 Other specified disorders of muscle: Secondary | ICD-10-CM | POA: Diagnosis not present

## 2013-07-13 DIAGNOSIS — I059 Rheumatic mitral valve disease, unspecified: Secondary | ICD-10-CM | POA: Diagnosis not present

## 2013-07-13 DIAGNOSIS — Z5189 Encounter for other specified aftercare: Secondary | ICD-10-CM | POA: Diagnosis not present

## 2013-07-13 DIAGNOSIS — I6529 Occlusion and stenosis of unspecified carotid artery: Secondary | ICD-10-CM | POA: Diagnosis not present

## 2013-07-13 NOTE — Progress Notes (Signed)
Pt started cardiac rehab today.  VSS, telemetry-sinus rhythm, bundle branch block.  Pt developed increasing shortness of breath with exertion.  Pt experienced significant dyspnea with walking treadmill.  O2 sat-98% lungs clear.  Dyspnea relieved with purse lip breathing initially, however dyspnea returned during cool down with light stretching associated with anxiety and panic.  PC to Dr. Claris Gladden office. Dr [name] advised pt continue cardiac rehab participation, notify office if symptoms unresolved in 2 weeks or sooner if symptoms worsen. Pt instructed to use albuterol inhaler prior to exercise, at rehab and with activity at home. Pt also instructed to drink 4oz caffeine with brilinta dose as recommended by manufacturer to relieve side effect of dyspnea.  Dr. [name] agreed with plan for preventive measures.  Pt instructed in Purse lip breathing, guided imagery and diaphragmatic breathing which ultimately relieved the dyspnea and associated anxiety. Pt reports increased feelings of depression associated with situational stress from her medical condition and prognosis. Pt reports prozac dose increased to 30mg  daily by her PCP, however pt reports she is often tearful and anxious. appt made with Jeanella Craze, Chaplain, to discuss her fears and concerns re: her illness and difficulty with sick role.  Will continue to monitor pt mood and make appt with behavioral health therapist if symptoms persist or worsen.  Pt denies suicidal or homicidal ideation.  Pt instructed to use her albuterol inhaler and take noon dose of xanax as directed upon arriving home.  Understanding verbalized.   Pt states she is concerned about her fasting AM CBG-199 and is frequently elevated in the morning. Pt states she will contact Dr. Cindra Eves office for insulin dosing instructions. Pt offer emotional support and reassurance. Pt dyspnea and anxiety resolved prior to leaving.

## 2013-07-16 ENCOUNTER — Encounter (HOSPITAL_COMMUNITY)
Admission: RE | Admit: 2013-07-16 | Discharge: 2013-07-16 | Disposition: A | Discharge status: Home / Self Care | Payer: Medicare Other | Source: Ambulatory Visit | Attending: Interventional Cardiology | Admitting: Interventional Cardiology

## 2013-07-16 ENCOUNTER — Encounter (HOSPITAL_COMMUNITY): Payer: Medicare Other

## 2013-07-16 ENCOUNTER — Telehealth: Payer: Self-pay | Admitting: Cardiology

## 2013-07-16 DIAGNOSIS — Z5189 Encounter for other specified aftercare: Secondary | ICD-10-CM | POA: Diagnosis not present

## 2013-07-16 LAB — GLUCOSE, CAPILLARY
Glucose-Capillary: 201 mg/dL — ABNORMAL HIGH (ref 70–99)
Glucose-Capillary: 175 mg/dL — ABNORMAL HIGH (ref 70–99)
Glucose-Capillary: 194 mg/dL — ABNORMAL HIGH (ref 70–99)
Glucose-Capillary: 193 mg/dL — ABNORMAL HIGH (ref 70–99)

## 2013-07-16 NOTE — Progress Notes (Addendum)
Patient tolerated second day of exercise better. I encouraged [name] [name] to take several rest breaks throughout exercise with good results. Patient's post exercise heart rate noted at 47 nonsustained. Blood pressures stable. Will notify Dr [name] about continued bradycardia. [name] [name] says her metoprolol was recently decreased. Will continue to monitor the patient throughout  the program. I talked with the patient over the phone this afternoon to review her metoprolol dose. [name] [name] is taking Metoprolol 25 mg twice a day. Per note from Richardson Dopp [facility] on 07/04/2013. Patient's metoprolol was decreased to 12.5 twice a day. [name] [name] did not do this. I was unable to get anyone on the phone at Dr Claris Gladden office to clarify this. Patient instructed not to take the additional dose  Of metoprolol she has been taking in the evening. The patient took 25 mg of metoprolol  this morning. Will get further clarification tomorrow.

## 2013-07-16 NOTE — Telephone Encounter (Signed)
New problem    Need to speak with you concerning pt.

## 2013-07-16 NOTE — Telephone Encounter (Signed)
Pt states she continues to be SOB. She went to Cardiac Rehab today and was able to exercise a little more today than last week. She will exercise for a few minutes, rest, then exercise some more. Her resting heart rate today was 47. Nicki Reaper W recommended she decrease metoprolol to 12.5mg  bid 07/04/13. Pt had not done that yet. She will decrease metoprolol to 12.5mg  bid starting today.   Dr [name] reviewed monitor done 07/09/13 Rare PACs and PVCs Overall OK.  Pt notified.   Pt states she is willing to give symptoms a little more time before any recommendations are made regarding Brilinta. I will forward to Dr [name] for review.

## 2013-07-18 ENCOUNTER — Other Ambulatory Visit: Payer: Self-pay

## 2013-07-18 ENCOUNTER — Encounter (HOSPITAL_COMMUNITY): Payer: Medicare Other

## 2013-07-18 ENCOUNTER — Encounter (HOSPITAL_COMMUNITY): Payer: Self-pay | Admitting: Emergency Medicine

## 2013-07-18 ENCOUNTER — Encounter (HOSPITAL_COMMUNITY)
Admission: RE | Admit: 2013-07-18 | Discharge: 2013-07-18 | Disposition: A | Discharge status: Home / Self Care | Payer: Medicare Other | Source: Ambulatory Visit | Attending: Interventional Cardiology | Admitting: Interventional Cardiology

## 2013-07-18 ENCOUNTER — Emergency Department (HOSPITAL_COMMUNITY): Payer: Medicare Other

## 2013-07-18 ENCOUNTER — Observation Stay (HOSPITAL_COMMUNITY)
Admission: EM | Admit: 2013-07-18 | Discharge: 2013-07-19 | Disposition: A | Discharge status: Home / Self Care | Payer: Medicare Other | Attending: Interventional Cardiology | Admitting: Interventional Cardiology

## 2013-07-18 DIAGNOSIS — G8929 Other chronic pain: Secondary | ICD-10-CM | POA: Insufficient documentation

## 2013-07-18 DIAGNOSIS — I10 Essential (primary) hypertension: Secondary | ICD-10-CM

## 2013-07-18 DIAGNOSIS — Z79899 Other long term (current) drug therapy: Secondary | ICD-10-CM | POA: Insufficient documentation

## 2013-07-18 DIAGNOSIS — Z7982 Long term (current) use of aspirin: Secondary | ICD-10-CM | POA: Insufficient documentation

## 2013-07-18 DIAGNOSIS — N189 Chronic kidney disease, unspecified: Secondary | ICD-10-CM | POA: Insufficient documentation

## 2013-07-18 DIAGNOSIS — R0789 Other chest pain: Secondary | ICD-10-CM | POA: Diagnosis present

## 2013-07-18 DIAGNOSIS — Z951 Presence of aortocoronary bypass graft: Secondary | ICD-10-CM | POA: Insufficient documentation

## 2013-07-18 DIAGNOSIS — I2510 Atherosclerotic heart disease of native coronary artery without angina pectoris: Principal | ICD-10-CM | POA: Insufficient documentation

## 2013-07-18 DIAGNOSIS — M1710 Unilateral primary osteoarthritis, unspecified knee: Secondary | ICD-10-CM | POA: Insufficient documentation

## 2013-07-18 DIAGNOSIS — I5032 Chronic diastolic (congestive) heart failure: Secondary | ICD-10-CM

## 2013-07-18 DIAGNOSIS — M19049 Primary osteoarthritis, unspecified hand: Secondary | ICD-10-CM | POA: Insufficient documentation

## 2013-07-18 DIAGNOSIS — Z880 Allergy status to penicillin: Secondary | ICD-10-CM | POA: Insufficient documentation

## 2013-07-18 DIAGNOSIS — R079 Chest pain, unspecified: Secondary | ICD-10-CM

## 2013-07-18 DIAGNOSIS — E119 Type 2 diabetes mellitus without complications: Secondary | ICD-10-CM | POA: Insufficient documentation

## 2013-07-18 DIAGNOSIS — I252 Old myocardial infarction: Secondary | ICD-10-CM | POA: Insufficient documentation

## 2013-07-18 DIAGNOSIS — Z9884 Bariatric surgery status: Secondary | ICD-10-CM | POA: Insufficient documentation

## 2013-07-18 DIAGNOSIS — IMO0002 Reserved for concepts with insufficient information to code with codable children: Secondary | ICD-10-CM | POA: Insufficient documentation

## 2013-07-18 DIAGNOSIS — J45901 Unspecified asthma with (acute) exacerbation: Secondary | ICD-10-CM | POA: Insufficient documentation

## 2013-07-18 DIAGNOSIS — E785 Hyperlipidemia, unspecified: Secondary | ICD-10-CM | POA: Insufficient documentation

## 2013-07-18 DIAGNOSIS — I129 Hypertensive chronic kidney disease with stage 1 through stage 4 chronic kidney disease, or unspecified chronic kidney disease: Secondary | ICD-10-CM | POA: Insufficient documentation

## 2013-07-18 DIAGNOSIS — R011 Cardiac murmur, unspecified: Secondary | ICD-10-CM | POA: Insufficient documentation

## 2013-07-18 DIAGNOSIS — F411 Generalized anxiety disorder: Secondary | ICD-10-CM | POA: Insufficient documentation

## 2013-07-18 DIAGNOSIS — G43909 Migraine, unspecified, not intractable, without status migrainosus: Secondary | ICD-10-CM | POA: Insufficient documentation

## 2013-07-18 DIAGNOSIS — I509 Heart failure, unspecified: Secondary | ICD-10-CM | POA: Insufficient documentation

## 2013-07-18 DIAGNOSIS — Z9861 Coronary angioplasty status: Secondary | ICD-10-CM | POA: Insufficient documentation

## 2013-07-18 DIAGNOSIS — Z9889 Other specified postprocedural states: Secondary | ICD-10-CM | POA: Insufficient documentation

## 2013-07-18 DIAGNOSIS — Z794 Long term (current) use of insulin: Secondary | ICD-10-CM | POA: Insufficient documentation

## 2013-07-18 DIAGNOSIS — Z8701 Personal history of pneumonia (recurrent): Secondary | ICD-10-CM | POA: Insufficient documentation

## 2013-07-18 DIAGNOSIS — K219 Gastro-esophageal reflux disease without esophagitis: Secondary | ICD-10-CM | POA: Insufficient documentation

## 2013-07-18 DIAGNOSIS — Z862 Personal history of diseases of the blood and blood-forming organs and certain disorders involving the immune mechanism: Secondary | ICD-10-CM | POA: Insufficient documentation

## 2013-07-18 DIAGNOSIS — Z8673 Personal history of transient ischemic attack (TIA), and cerebral infarction without residual deficits: Secondary | ICD-10-CM | POA: Insufficient documentation

## 2013-07-18 DIAGNOSIS — G459 Transient cerebral ischemic attack, unspecified: Secondary | ICD-10-CM | POA: Insufficient documentation

## 2013-07-18 DIAGNOSIS — Z5189 Encounter for other specified aftercare: Secondary | ICD-10-CM | POA: Diagnosis not present

## 2013-07-18 HISTORY — DX: Left bundle-branch block, unspecified: I44.7

## 2013-07-18 LAB — GLUCOSE, CAPILLARY
Glucose-Capillary: 215 mg/dL — ABNORMAL HIGH (ref 70–99)
Glucose-Capillary: 213 mg/dL — ABNORMAL HIGH (ref 70–99)
Glucose-Capillary: 127 mg/dL — ABNORMAL HIGH (ref 70–99)

## 2013-07-18 LAB — CBC
HCT: 40.5 % (ref 36.0–46.0)
HCT: 41.3 % (ref 36.0–46.0)
Hemoglobin: 13.8 g/dL (ref 12.0–15.0)
Hemoglobin: 14 g/dL (ref 12.0–15.0)
MCH: 31.8 pg (ref 26.0–34.0)
MCH: 31.8 pg (ref 26.0–34.0)
MCHC: 34.1 g/dL (ref 30.0–36.0)
MCHC: 33.9 g/dL (ref 30.0–36.0)
MCV: 93.3 fL (ref 78.0–100.0)
MCV: 93.9 fL (ref 78.0–100.0)
Platelets: 249 10*3/uL (ref 150–400)
Platelets: 234 10*3/uL (ref 150–400)
RBC: 4.34 MIL/uL (ref 3.87–5.11)
RBC: 4.4 MIL/uL (ref 3.87–5.11)
RDW: 12.7 % (ref 11.5–15.5)
RDW: 12.8 % (ref 11.5–15.5)
WBC: 8.9 10*3/uL (ref 4.0–10.5)
WBC: 8.5 10*3/uL (ref 4.0–10.5)

## 2013-07-18 LAB — BASIC METABOLIC PANEL
BUN: 27 mg/dL — ABNORMAL HIGH (ref 6–23)
CO2: 25 mEq/L (ref 19–32)
Calcium: 10 mg/dL (ref 8.4–10.5)
Chloride: 99 mEq/L (ref 96–112)
Creatinine, Ser: 1.19 mg/dL — ABNORMAL HIGH (ref 0.50–1.10)
GFR calc Af Amer: 54 mL/min — ABNORMAL LOW (ref >90)
GFR calc non Af Amer: 47 mL/min — ABNORMAL LOW (ref >90)
Glucose, Bld: 180 mg/dL — ABNORMAL HIGH (ref 70–99)
Potassium: 5.1 mEq/L (ref 3.7–5.3)
Sodium: 140 mEq/L (ref 137–147)

## 2013-07-18 LAB — HEPATIC FUNCTION PANEL
ALT: 20 U/L (ref 0–35)
AST: 21 U/L (ref 0–37)
Albumin: 3.8 g/dL (ref 3.5–5.2)
Alkaline Phosphatase: 84 U/L (ref 39–117)
Bilirubin, Direct: <0.2 mg/dL (ref 0.0–0.3)
Total Bilirubin: 0.8 mg/dL (ref 0.3–1.2)
Total Protein: 7.3 g/dL (ref 6.0–8.3)

## 2013-07-18 LAB — I-STAT TROPONIN, ED: Troponin i, poc: 0.05 ng/mL (ref 0.00–0.08)

## 2013-07-18 LAB — CREATININE, SERUM
Creatinine, Ser: 1.12 mg/dL — ABNORMAL HIGH (ref 0.50–1.10)
GFR calc Af Amer: 58 mL/min — ABNORMAL LOW (ref >90)
GFR calc non Af Amer: 50 mL/min — ABNORMAL LOW (ref >90)

## 2013-07-18 LAB — TROPONIN I
Troponin I: <0.3 ng/mL (ref <0.30)
Troponin I: <0.3 ng/mL (ref <0.30)

## 2013-07-18 MED ORDER — NITROGLYCERIN 0.4 MG SL SUBL: 0.4000 mg | SUBLINGUAL_TABLET | SUBLINGUAL | Status: DC | PRN

## 2013-07-18 MED ORDER — MAGNESIUM OXIDE 250 MG PO TABS: 250.0000 mg | ORAL_TABLET | Freq: Two times a day (BID) | ORAL | Status: DC

## 2013-07-18 MED ORDER — METOPROLOL TARTRATE 12.5 MG HALF TABLET
12.5000 mg | ORAL_TABLET | Freq: Two times a day (BID) | ORAL | Status: DC
  Administered 2013-07-18: 12.5 mg via ORAL
  Filled 2013-07-18 (×3): qty 1

## 2013-07-18 MED ORDER — TOPIRAMATE 25 MG PO TABS
50.0000 mg | ORAL_TABLET | Freq: Two times a day (BID) | ORAL | Status: DC
  Administered 2013-07-18: 50 mg via ORAL
  Filled 2013-07-18 (×3): qty 2

## 2013-07-18 MED ORDER — ASPIRIN EC 81 MG PO TBEC
81.0000 mg | DELAYED_RELEASE_TABLET | Freq: Every day | ORAL | Status: DC
  Filled 2013-07-18: qty 1

## 2013-07-18 MED ORDER — FUROSEMIDE 40 MG PO TABS: 40.0000 mg | ORAL_TABLET | Freq: Every morning | ORAL | Status: DC

## 2013-07-18 MED ORDER — ZINC GLUCONATE 50 MG PO TABS: 50.0000 mg | ORAL_TABLET | Freq: Every day | ORAL | Status: DC

## 2013-07-18 MED ORDER — INSULIN ASPART 100 UNIT/ML ~~LOC~~ SOLN
5.0000 [IU] | Freq: Two times a day (BID) | SUBCUTANEOUS | Status: DC
  Administered 2013-07-19: 5 [IU] via SUBCUTANEOUS

## 2013-07-18 MED ORDER — PANTOPRAZOLE SODIUM 40 MG PO TBEC: 40.0000 mg | DELAYED_RELEASE_TABLET | Freq: Every day | ORAL | Status: DC

## 2013-07-18 MED ORDER — ALBUTEROL 90 MCG/ACT IN AERS: 2.0000 | INHALATION_SPRAY | Freq: Four times a day (QID) | RESPIRATORY_TRACT | Status: DC | PRN

## 2013-07-18 MED ORDER — INSULIN GLARGINE 100 UNIT/ML ~~LOC~~ SOLN
4.0000 [IU] | Freq: Every evening | SUBCUTANEOUS | Status: DC | PRN
  Filled 2013-07-18: qty 0.16

## 2013-07-18 MED ORDER — FUROSEMIDE 40 MG PO TABS
40.0000 mg | ORAL_TABLET | Freq: Every morning | ORAL | Status: DC
  Filled 2013-07-18: qty 1

## 2013-07-18 MED ORDER — INSULIN ASPART 100 UNIT/ML ~~LOC~~ SOLN
0.0000 [IU] | Freq: Three times a day (TID) | SUBCUTANEOUS | Status: DC
  Administered 2013-07-19: 4 [IU] via SUBCUTANEOUS

## 2013-07-18 MED ORDER — TRIAMTERENE-HCTZ 37.5-25 MG PO CAPS
1.0000 | ORAL_CAPSULE | Freq: Every day | ORAL | Status: DC | PRN
  Filled 2013-07-18: qty 1

## 2013-07-18 MED ORDER — VITAMIN B-12 1000 MCG PO TABS
1000.0000 ug | ORAL_TABLET | Freq: Every day | ORAL | Status: DC
  Filled 2013-07-18 (×2): qty 1

## 2013-07-18 MED ORDER — B COMPLEX-C PO TABS
1.0000 | ORAL_TABLET | Freq: Every day | ORAL | Status: DC
Start: 2013-07-19 — End: 2013-07-19
  Filled 2013-07-18: qty 1

## 2013-07-18 MED ORDER — IPRATROPIUM-ALBUTEROL 0.5-2.5 (3) MG/3ML IN SOLN: 3.0000 mL | RESPIRATORY_TRACT | Status: DC | PRN

## 2013-07-18 MED ORDER — CHOLESTYRAMINE 4 G PO PACK
2.0000 g | PACK | Freq: Two times a day (BID) | ORAL | Status: DC
  Administered 2013-07-19: 2 g via ORAL
  Filled 2013-07-18 (×3): qty 1

## 2013-07-18 MED ORDER — ASPIRIN 81 MG PO CHEW
243.0000 mg | CHEWABLE_TABLET | Freq: Once | ORAL | Status: AC
  Administered 2013-07-18: 243 mg via ORAL
  Filled 2013-07-18: qty 3

## 2013-07-18 MED ORDER — FUROSEMIDE 20 MG PO TABS
20.0000 mg | ORAL_TABLET | Freq: Every evening | ORAL | Status: DC
  Filled 2013-07-18 (×2): qty 1

## 2013-07-18 MED ORDER — FLUOXETINE HCL 20 MG PO TABS
30.0000 mg | ORAL_TABLET | Freq: Every morning | ORAL | Status: DC
  Filled 2013-07-18: qty 2

## 2013-07-18 MED ORDER — GABAPENTIN 600 MG PO TABS
600.0000 mg | ORAL_TABLET | Freq: Every day | ORAL | Status: DC
  Administered 2013-07-18: 600 mg via ORAL
  Filled 2013-07-18 (×2): qty 1

## 2013-07-18 MED ORDER — RANOLAZINE ER 500 MG PO TB12
1000.0000 mg | ORAL_TABLET | Freq: Two times a day (BID) | ORAL | Status: DC
  Administered 2013-07-18: 1000 mg via ORAL
  Filled 2013-07-18 (×3): qty 2

## 2013-07-18 MED ORDER — CLONIDINE HCL 0.2 MG PO TABS
0.2000 mg | ORAL_TABLET | Freq: Two times a day (BID) | ORAL | Status: DC
  Administered 2013-07-18: 0.2 mg via ORAL
  Filled 2013-07-18 (×3): qty 1

## 2013-07-18 MED ORDER — CALCIUM CITRATE 950 (200 CA) MG PO TABS
200.0000 mg | ORAL_TABLET | Freq: Every day | ORAL | Status: DC
  Filled 2013-07-18 (×2): qty 1

## 2013-07-18 MED ORDER — SODIUM CHLORIDE 0.9 % IV BOLUS (SEPSIS): 500.0000 mL | Freq: Once | INTRAVENOUS | Status: DC

## 2013-07-18 MED ORDER — AMLODIPINE BESYLATE 10 MG PO TABS
10.0000 mg | ORAL_TABLET | Freq: Every day | ORAL | Status: DC
  Filled 2013-07-18: qty 1

## 2013-07-18 MED ORDER — ATORVASTATIN CALCIUM 80 MG PO TABS
80.0000 mg | ORAL_TABLET | Freq: Every day | ORAL | Status: DC
  Filled 2013-07-18: qty 1

## 2013-07-18 MED ORDER — HEPARIN SODIUM (PORCINE) 5000 UNIT/ML IJ SOLN
5000.0000 [IU] | Freq: Three times a day (TID) | INTRAMUSCULAR | Status: DC
  Administered 2013-07-18 – 2013-07-19 (×2): 5000 [IU] via SUBCUTANEOUS
  Filled 2013-07-18 (×5): qty 1

## 2013-07-18 MED ORDER — ALBUTEROL SULFATE (2.5 MG/3ML) 0.083% IN NEBU: 2.5000 mg | INHALATION_SOLUTION | Freq: Four times a day (QID) | RESPIRATORY_TRACT | Status: DC | PRN

## 2013-07-18 MED ORDER — FUROSEMIDE 20 MG PO TABS: 20.0000 mg | ORAL_TABLET | Freq: Every day | ORAL | Status: DC

## 2013-07-18 MED ORDER — INSULIN GLARGINE 100 UNIT/ML ~~LOC~~ SOLN
48.0000 [IU] | Freq: Every morning | SUBCUTANEOUS | Status: DC
  Filled 2013-07-18: qty 0.48

## 2013-07-18 MED ORDER — ZOLPIDEM TARTRATE 5 MG PO TABS
5.0000 mg | ORAL_TABLET | Freq: Every evening | ORAL | Status: DC | PRN
  Administered 2013-07-18: 5 mg via ORAL
  Filled 2013-07-18: qty 1

## 2013-07-18 MED ORDER — ALPRAZOLAM 0.5 MG PO TABS: 0.5000 mg | ORAL_TABLET | Freq: Two times a day (BID) | ORAL | Status: DC | PRN

## 2013-07-18 MED ORDER — VITAMIN D 1000 UNITS PO TABS
2000.0000 [IU] | ORAL_TABLET | Freq: Every day | ORAL | Status: DC
  Filled 2013-07-18 (×2): qty 2

## 2013-07-18 MED ORDER — EZETIMIBE 10 MG PO TABS
10.0000 mg | ORAL_TABLET | Freq: Every day | ORAL | Status: DC
  Administered 2013-07-18: 10 mg via ORAL
  Filled 2013-07-18 (×2): qty 1

## 2013-07-18 MED ORDER — HYDRALAZINE HCL 50 MG PO TABS
50.0000 mg | ORAL_TABLET | Freq: Three times a day (TID) | ORAL | Status: DC | PRN
  Filled 2013-07-18: qty 1

## 2013-07-18 MED ORDER — HYDROCODONE-ACETAMINOPHEN 5-325 MG PO TABS
1.0000 | ORAL_TABLET | Freq: Every evening | ORAL | Status: DC
  Administered 2013-07-18: 1 via ORAL
  Filled 2013-07-18: qty 1

## 2013-07-18 MED ORDER — TICAGRELOR 90 MG PO TABS
90.0000 mg | ORAL_TABLET | Freq: Two times a day (BID) | ORAL | Status: DC
  Administered 2013-07-18: 90 mg via ORAL
  Filled 2013-07-18 (×3): qty 1

## 2013-07-18 MED ORDER — MOMETASONE FURO-FORMOTEROL FUM 100-5 MCG/ACT IN AERO
2.0000 | INHALATION_SPRAY | Freq: Two times a day (BID) | RESPIRATORY_TRACT | Status: DC
  Administered 2013-07-18 – 2013-07-19 (×2): 2 via RESPIRATORY_TRACT
  Filled 2013-07-18: qty 8.8

## 2013-07-18 MED ORDER — INSULIN LISPRO 100 UNIT/ML (KWIKPEN)
5.0000 [IU] | PEN_INJECTOR | Freq: Two times a day (BID) | SUBCUTANEOUS | Status: DC
Start: 2013-07-19 — End: 2013-07-18

## 2013-07-18 MED ORDER — LOPERAMIDE HCL 2 MG PO CAPS: 2.0000 mg | ORAL_CAPSULE | ORAL | Status: DC | PRN

## 2013-07-18 MED ORDER — LOSARTAN POTASSIUM 50 MG PO TABS
100.0000 mg | ORAL_TABLET | Freq: Two times a day (BID) | ORAL | Status: DC
  Administered 2013-07-18: 100 mg via ORAL
  Filled 2013-07-18 (×3): qty 2

## 2013-07-18 MED ORDER — ADULT MULTIVITAMIN W/MINERALS CH
1.0000 | ORAL_TABLET | Freq: Every day | ORAL | Status: DC
  Filled 2013-07-18 (×2): qty 1

## 2013-07-18 MED ORDER — MAGNESIUM OXIDE 400 (241.3 MG) MG PO TABS
200.0000 mg | ORAL_TABLET | Freq: Two times a day (BID) | ORAL | Status: DC
  Administered 2013-07-18: 200 mg via ORAL
  Filled 2013-07-18 (×3): qty 0.5

## 2013-07-18 NOTE — Progress Notes (Signed)
[name] [name] 68 y.o. female Nutrition Note Spoke with pt. Pt well-known to this Probation officer from previous admissions. Nutrition Plan and Nutrition Survey goals reviewed with pt. Pt is following Step 2 of the Therapeutic Lifestyle Changes diet. Pt wants to lose wt. Pt currently dealing with a decreased appetite due to depression. Pt reports her MD increased her Prozac from 20 to 30 mg, "but it hasn't been long enough to see a difference yet." Pt is diabetic. Pt reports her last A1c was 7.2, which is down from her previous A1c of 7.6. Per discussion, the pt feels her A1c will be increased the next time due to "blood sugars being high." Pt checks CBG's 3-4 times daily. Fasting CBG's have ranged from 199-212 mg/dL. Pt states she recently increased her pre-meal Humalog from 5 units to 8-10 units and her fasting CBG's have been 134-154 mg/dL. This Probation officer went over Diabetes Education test results. Pt expressed understanding of the information reviewed. Pt aware of nutrition education classes offered.  Nutrition Diagnosis   Food-and nutrition-related knowledge deficit related to lack of exposure to information as related to diagnosis of: ? CVD ? DM   Obesity related to excessive energy intake as evidenced by a BMI of 48.3  Nutrition RX/ Estimated Daily Nutrition Needs for: wt loss  1450-1950 Kcal, 40-50 gm fat, 9-15 gm sat fat, 1.4-2.0 gm trans-fat, <1500 mg sodium, 175-250 gm CHO   Nutrition Intervention   Pt's individual nutrition plan reviewed with pt.   Benefits of adopting Therapeutic Lifestyle Changes discussed when Medficts reviewed.   Pt to attend the Portion Distortion class   Pt to attend the  ? Nutrition I class                     ? Nutrition II class        ? Diabetes Blitz class       ? Diabetes Q & A class   Continue client-centered nutrition education by RD, as part of interdisciplinary care. Goal(s)   Pt to identify food quantities necessary to achieve: ? wt loss to a goal wt of 248-266 lb  (112.9-121.1 kg) at graduation from cardiac rehab.    CBG concentrations in the normal range or as close to normal as is safely possible. Monitor and Evaluate progress toward nutrition goal with team. Nutrition Risk: Change to Moderate Derek Mound, M.Ed, RD, LDN, CDE 07/18/2013 10:15 AM

## 2013-07-18 NOTE — H&P (Signed)
Patient ID: [name] [name] [name]: [id number], DOB/AGE: 1946/01/27   Admit date: 07/18/2013   Primary Physician: Marylene Land, MD Primary Cardiologist: Dr. [name]  Pt. Profile:  68 year old Caucasian female was significant past medical history of coronary artery disease status post post 5 vessel bypass surgery, diastolic heart failure, history of CVA, history of carotid artery disease, hypertension, OSA, dyslipidemia, chronic kidney disease and chronic left bundle branch block presented to [facility] after having chest pain during cardiac rehabilitation exercise.  Problem List  Past Medical History  Diagnosis Date  . Vomiting     persistent  . Asthma   . Ventral hernia     hx of  . GERD (gastroesophageal reflux disease)   . Obesity 01-2010  . Hyperlipidemia   . Hypertension   . Irregular heart beat   . Coronary artery disease   . Heart murmur   . Shortness of breath   . Anxiety   . PONV (postoperative nausea and vomiting)   . Peripheral vascular disease     ? numbness, tingling arms and legs  . Anemia     hx  . H/O hiatal hernia   . Neuromuscular disorder     ?  Marland Kitchen Dysrhythmia   . Mental disorder     hx of confusion  . Chronic kidney disease     frequency  . CHF (congestive heart failure)   . Anginal pain   . Myocardial infarction     04/1999, 02/2000, 01/2005; 2011; 2014  . Pneumonia 2000's    "once"  . Chronic bronchitis     "off and on all the time" (06/13/2013)  . Claustrophobia   . Type II diabetes mellitus   . Chronic lower back pain   . Other and unspecified angina pectoris   . Stroke 03/22/12    right side brain; denies residual on 06/13/2013)  . Obstructive sleep apnea     "can't wear machine; I have claustrophobia" (06/13/2013)  . Migraine     "used to have them really bad; kind of eased up now; down to 1 q couple months" (06/13/2013)  . Headache     "probably 4 days/wk" (06/13/2013)  . Osteoarthritis     "knees and hands" (06/13/2013)    Past Surgical History  Procedure Laterality Date  . Tumor removal  1970's    ovarian  . Tibia fracture surgery Right 1970's    rods and pins  . Gastric bypass  1984    "stapeling"  . Total abdominal hysterectomy  1970's  . Ankle fracture surgery Left 1970's  . Fracture surgery    . Esophagogastroduodenoscopy  08/03/2011    Procedure: ESOPHAGOGASTRODUODENOSCOPY (EGD);  Surgeon: Shann Medal, MD;  Location: [facility];  Service: General;  Laterality: N/A;  . Gall stone removal  05/2003  . Root canal  10/2000  . Left heart cath  10/10/12    Dr [name].  . Coronary artery bypass graft  1220/11    "CABG X5"  . Appendectomy  1970's    w/hysterectomy  . Cholecystectomy  ?1980's  . Hernia repair  ?10's X2    "in my stomach; had OR on it twice" (04/24/2013)  . Coronary angioplasty with stent placement  01,02,05,06,07,08,11; 04/24/2013    "I've probably got ~ 10 stents by now" (04/24/2013)  . Coronary angioplasty with stent placement  06/13/2013    "got 4 stents today" (06/13/2013)     Allergies  Allergies  Allergen Reactions  . Amoxicillin Shortness  Of Breath and Rash  . Erythromycin Shortness Of Breath and Other (See Comments)    Trouble swallowing  . Penicillins Shortness Of Breath and Rash  . Metformin And Related     Stomach pain, cold sweats, joint pain, burred vision, dizziness  . Tape Other (See Comments)    Plastic tape causes skin to rip if left on for long periods of time  . Isosorbide Mononitrate [Isosorbide]     Joint aches, muscles hurt, difficult to walk    HPI  67 year old morbidly obese Caucasian female with PMH significant for coronary artery disease status post 5-vessel bypass surgery in [id number], diastolic heart failure, OSA intolerant to CPAP, history of CVA, carotid artery disease s/p L carotid stent, hypertension, dyslipidemia, chronic kidney disease and chronic left bundle branch block. She had a cardiac catheterization in September 2014 which showed patent SVG  to Diag, LIMA to LAD and sequential SVG to OM. However it was noted her SVG to RCA and native RCA were both occluded at the time. She was having increasing dyspnea with exertion and was felt to be related to the occlusion of her RCA. She underwent a CTO cath on 04/24/2013 with attempt to open up the chronically occluded RCA. A cutting balloon was used to open up the proximal RCA with 25% residual, however mid to distal RCA had diffuse disease and also distal RCA coronary dissection which was unable to cross with wire. Patient was discharged home to allow dissection to heal over time. She was brought back to the cath lab for repeat CTO on 06/13/2013. A total of 4 drug-eluting stents were placed in the RCA. Her Plavix was changed to Effient at the time of her discharge due to possible interaction of Plavix with her Prozac. She was continued on aspirin and Effient for 3 weeks afterward, however Effient was eventually switched to Brilinta as she had a history of CVA.   Since her CTO, patient has continued to have some mild exertional chest discomfort and shortness of breath associated with exertion. It is relieved with rest. Interestingly, the presentation of her original MI was jaw pain radiating to bilateral shoulders, mild chest discomfort and nausea. She denies any recurrence of jaw pain or shoulder pain since last cardiac cath. She states she has been compliant with her medication since her discharge. Patient was seen by out APP in cardiology clinic on 07/04/2013, at which time, she was doing well however continued to have some exertional dyspnea along with chest discomfort during exercise. The only relieving factor is rest. She denies any recent fever, chill, cough, GI discomfort, or bleeding problem. She has chronic lower extremity swelling however has not noticed any significant increased recently. She sleeps on an incline and has not experienced any paroxysmal nocturnal dyspnea recently. Her metoprolol was  recently cut down to 12.5 mg twice a day due to bradycardia by out APP, however patient did not decrease her metoprolol until 07/16/2013.  Patient was starting her cardiac rehabilitation exercise on 07/18/2013 when she experienced significant dyspnea along with substernal chest pressure/palpitation similar to what she has been feeling since her discharge. However, this time patient was eager to continue to exercise, and therefore she did not inform to nurse of her symptoms. Chest discomfort lasted about 3-4 minute and quickly improved with rest. However it did not completely went away and would come on and off for one to 2 minute at a time. She eventually told the cardiac rehabilitation nurse. Her blood pressure was noted to  be in the 80s, however with fluid intake, her BP returned to >100. Rehab nurse contacted cardiology group who recommended patient to seek further evaluation at [facility] ED. On presentation, she was noted to be normotensive with blood pressure in the 120s. Bradycardic with heart rate in the 50s. O2 saturation 100% room air. EKG was noted to have chronic left bundle branch block. Chest x-ray was negative for acute process. Cardiology was consulted for further evaluation.    Home Medication: Prior to Admission medications   Medication Sig Start Date End Date Taking? Authorizing Provider  albuterol (PROVENTIL) (2.5 MG/3ML) 0.083% nebulizer solution Take 2.5 mg by nebulization every 6 (six) hours as needed for shortness of breath (when asthma is active).    Yes Historical Provider, MD  albuterol (PROVENTIL,VENTOLIN) 90 MCG/ACT inhaler Inhale 2 puffs into the lungs every 6 (six) hours as needed for shortness of breath.    Yes Historical Provider, MD  ALPRAZolam Duanne Moron) 0.5 MG tablet Take 0.5 mg by mouth 2 (two) times daily as needed for anxiety. For anxiety - Patient takes 1 in the morning and 1 in the evening daily. If needed she may take one in the afternoon.   Yes Historical Provider, MD   amLODipine (NORVASC) 10 MG tablet Take 1 tablet (10 mg total) by mouth daily. 10/04/12  Yes Larey Dresser, MD  aspirin EC 81 MG tablet Take 81 mg by mouth daily.   Yes Historical Provider, MD  B Complex-C (B-COMPLEX WITH VITAMIN C) tablet Take 1 tablet by mouth daily.   Yes Historical Provider, MD  calcium citrate (CALCITRATE - DOSED IN MG ELEMENTAL CALCIUM) 950 MG tablet Take 200 mg of elemental calcium by mouth daily.   Yes Historical Provider, MD  Cholecalciferol (VITAMIN D) 2000 UNITS tablet Take 2,000 Units by mouth daily.   Yes Historical Provider, MD  cholestyramine Lucrezia Starch) 4 GM/DOSE powder Take 2 g by mouth 2 (two) times daily with a meal. (mixed in juice or light kool-aid). 03/13/13  Yes Larey Dresser, MD  cloNIDine (CATAPRES) 0.2 MG tablet Take 0.2 mg by mouth 2 (two) times daily.   Yes Historical Provider, MD  ezetimibe (ZETIA) 10 MG tablet Take 1 tablet (10 mg total) by mouth daily. 07/04/13  Yes Scott T Kathlen Mody, PA-C  FLUoxetine (PROZAC) 20 MG tablet Take 30 mg by mouth every morning.   Yes Historical Provider, MD  Fluticasone-Salmeterol (ADVAIR DISKUS) 250-50 MCG/DOSE AEPB Inhale 1 puff into the lungs every 12 (twelve) hours.    Yes Historical Provider, MD  furosemide (LASIX) 40 MG tablet Take 40 mg by mouth every morning.   Yes Historical Provider, MD  furosemide (LASIX) 40 MG tablet Take 20-40 mg by mouth every evening. Take 20mg  every evening but may take a whole tablet of 40mg  in the evening depending on swelling.   Yes Historical Provider, MD  gabapentin (NEURONTIN) 600 MG tablet Take 600 mg by mouth at bedtime.    Yes Historical Provider, MD  HUMALOG KWIKPEN 100 UNIT/ML SOPN Inject 5 Units into the skin 2 (two) times daily with a meal.  09/19/12  Yes Historical Provider, MD  hydrALAZINE (APRESOLINE) 50 MG tablet Take 50 mg by mouth 3 (three) times daily as needed (for high blood pressure). If BP is >175   Yes Historical Provider, MD  HYDROcodone-acetaminophen (NORCO/VICODIN)  5-325 MG per tablet Take 1 tablet by mouth every evening.    Yes Historical Provider, MD  insulin glargine (LANTUS) 100 UNIT/ML injection Inject 48 Units  into the skin every morning.    Yes Historical Provider, MD  insulin glargine (LANTUS) 100 UNIT/ML injection Inject 4-16 Units into the skin at bedtime as needed (For blood sugar = or >165).   Yes Historical Provider, MD  ipratropium-albuterol (DUONEB) 0.5-2.5 (3) MG/3ML SOLN Take 3 mLs by nebulization as needed (If severe asthma , use every 4 hours for 10 days).    Yes Historical Provider, MD  loperamide (IMODIUM) 2 MG capsule Take 2 mg by mouth as needed for diarrhea or loose stools.   Yes Historical Provider, MD  losartan (COZAAR) 100 MG tablet Take 100 mg by mouth 2 (two) times daily.    Yes Historical Provider, MD  Magnesium Oxide 250 MG TABS Take 250 mg by mouth 2 (two) times daily.    Yes Historical Provider, MD  metoprolol tartrate (LOPRESSOR) 25 MG tablet Take 12.5 mg by mouth 2 (two) times daily.  07/16/13  Yes Liliane Shi, PA-C  Multiple Vitamin (MULTIVITAMIN) tablet Take 1 tablet by mouth daily.     Yes Historical Provider, MD  nitroGLYCERIN (NITROSTAT) 0.3 MG SL tablet Place 0.3 mg under the tongue every 5 (five) minutes x 3 doses as needed for chest pain.    Yes Historical Provider, MD  omeprazole (PRILOSEC) 20 MG capsule Take 20 mg by mouth daily.  03/15/13  Yes Historical Provider, MD  ondansetron (ZOFRAN) 4 MG tablet Take 1 tablet (4 mg total) by mouth 2 (two) times daily as needed. For nausea 03/21/13  Yes Samantha J Rhyne, PA-C  ranolazine (RANEXA) 1000 MG SR tablet Take 1 tablet (1,000 mg total) by mouth 2 (two) times daily. 07/04/13  Yes Liliane Shi, PA-C  rosuvastatin (CRESTOR) 40 MG tablet Take 40 mg by mouth daily.    Yes Historical Provider, MD  ticagrelor (BRILINTA) 90 MG TABS tablet Take 1 tablet (90 mg total) by mouth 2 (two) times daily. 07/04/13  Yes Scott T Kathlen Mody, PA-C  topiramate (TOPAMAX) 50 MG tablet Take 1 tablet  (50 mg total) by mouth 2 (two) times daily. 01/09/13  Yes Dennie Bible, NP  triamterene-hydrochlorothiazide (DYAZIDE) 37.5-25 MG per capsule Take 1 capsule by mouth daily as needed (For extra fluid).   Yes Historical Provider, MD  vitamin B-12 (CYANOCOBALAMIN) 1000 MCG tablet Take 1,000 mcg by mouth daily.   Yes Historical Provider, MD  zinc gluconate 50 MG tablet Take 50 mg by mouth daily.   Yes Historical Provider, MD  zolpidem (AMBIEN) 10 MG tablet Take 10 mg by mouth at bedtime as needed for sleep.    Yes Historical Provider, MD    Family History  Family History  Problem Relation Age of Onset  . Heart disease Father     Heart Disease before age 30  . Diabetes Father   . Hyperlipidemia Father   . Hypertension Father   . Heart attack Father   . Deep vein thrombosis Father   . Diabetes      family hx of paternal grandmother, aunt and grandfather- uncles  . Hypertension Mother   . Cancer Sister     Social History  History   Social History  . Marital Status: Married    Spouse Name: [name] [name]     Number of Children: 1  . Years of Education: 12+   Occupational History  . retired    Social History Main Topics  . Smoking status: Never Smoker   . Smokeless tobacco: Never Used  . Alcohol Use: No  . Drug  Use: No  . Sexual Activity: Yes    Birth Control/ Protection: Surgical     Comment: hysterectomy   Other Topics Concern  . Not on file   Social History Narrative   Patient lives at home with husband [name] [name].    Patient has 1 child.    Patient is not currently working.    Patient has 2 years of college.      Review of Systems General:  No chills, fever, night sweats or weight changes.  Cardiovascular:  No palpitations, paroxysmal nocturnal dyspnea. +chest pressure/palpitation, dyspnea on exertion and orthopnea Dermatological: No rash, lesions/masses Respiratory: No cough Urologic: No hematuria, dysuria Abdominal:   No nausea, vomiting, diarrhea, bright red blood  per rectum, melena, or hematemesis Neurologic:  No visual changes, wkns, changes in mental status. All other systems reviewed and are otherwise negative except as noted above.  Physical Exam  Blood pressure 145/65, pulse 53, temperature 97.6 F (36.4 C), temperature source Oral, resp. rate 22, weight 268 lb 15.4 oz (122 kg), SpO2 100.00%.  General: Pleasant, NAD. Morbidly obese Psych: Normal affect. Neuro: Alert and oriented X 3. Moves all extremities spontaneously. HEENT: Normal  Neck: Supple without bruits or JVD. Lungs:  Resp regular and unlabored, CTA. Heart: faint heart sound. no s3, s4, or murmurs. Abdomen: Soft, non-tender, non-distended, BS + x 4.  Extremities: No clubbing, cyanosis or edema. DP/PT/Radials 2+ and equal bilaterally.  Labs  Troponin [facility] Test)  Recent Labs  07/18/13 1203  TROPIPOC 0.05    Recent Labs  07/18/13 1312  TROPONINI <0.30   Lab Results  Component Value Date   WBC 8.9 07/18/2013   HGB 13.8 07/18/2013   HCT 40.5 07/18/2013   MCV 93.3 07/18/2013   PLT 249 07/18/2013    Recent Labs Lab 07/18/13 1213 07/18/13 1312  NA 140  --   K 5.1  --   CL 99  --   CO2 25  --   BUN 27*  --   CREATININE 1.19*  --   CALCIUM 10.0  --   PROT  --  7.3  BILITOT  --  0.8  ALKPHOS  --  84  ALT  --  20  AST  --  21  GLUCOSE 180*  --    Lab Results  Component Value Date   CHOL 197 05/09/2013   HDL 42.40 05/09/2013   LDLCALC 127* 05/09/2013   TRIG 140.0 05/09/2013   No results found for this basename: DDIMER     Radiology/Studies  Dg Chest 2 View  07/18/2013   CLINICAL DATA:  Chest pain and exertional shortness of breath.  EXAM: CHEST  2 VIEW  COMPARISON:  PA and lateral chest x-ray of March 06, 2012  FINDINGS: The lungs are well-expanded and clear. The cardiac silhouette is top-normal in size but stable. The patient has undergone previous CABG as well as coronary stent placement. There are 7 intact sternal wires. There is no pleural effusion.  The bony thorax is unremarkable.  IMPRESSION: There is no acute cardiopulmonary abnormality.   Electronically Signed   By: David  Martinique   On: 07/18/2013 14:23    ECG  Normal sinus rhythm with heart rate 60, left bundle branch block.  ASSESSMENT AND PLAN  1. Chest pain, present since last discharge, no change in frequency, related to exertion  - troponin negative x 2  - possibly relate to deconditioning and dyspnea  - presentation unlikely to be acute in-stent rethrombosis  -  admit to obs, trend troponin overnight. If negative, discharge tomorrow  - unless rule in for coronary disease, would avoid stress test or cath given atypical nature of her symptom  - dyspnea possibly relate to Brilinta (previously off plavix due to interaction with her prozac, no effient with h/o stroke)  2. Coronary artery disease status post 5-vessel bypass surgery in 2001  - Cath 10/2012 patent SVG to Diag, LIMA to LAD and sequential SVG to OM. SVG to RCA and native RCA were both occluded at the time  - CTO cath on 04/24/2013 with attempt to open up the chronically occluded RCA. A cutting balloon was used to open up the proximal RCA with 25% residual, however mid to distal RCA had diffuse disease and also distal RCA coronary dissection which was unable to cross with wire.  - Repeat CTO on 06/13/2013 with 4 drug-eluting stents were placed in the RCA  3. Chronic diastolic heart failure  - Echo 09/28/2012 EF 0000000, grade 2 diastolic HF, no wall motion abnormalities.  4. OSA intolerant to CPAP 5. history of CVA 6. carotid artery disease s/p L carotid stent 03/2013 7. Hypertension 8. Dyslipidemia 9. chronic kidney disease  10. chronic left bundle branch block 11. Intolerance to Imdur due to LE cramps   Signed, Almyra Deforest, [place] 07/18/2013, 3:18 PM Pager: [phone]  I have examined the patient and reviewed assessment and plan and discussed with patient.  Agree with above as stated.  Patient known to me.  SHe had a  complex intervention of the RCA CTO in May 2015.  Her typical angina, jaw pain radiating to the arms is gone, but she continues to have DOE.  Of note, Plavix was changed to Effient due to possible interaction of PLavix with prozac, but Effient was stopped due to prior stroke.  She was switched to Brilinta; however, given the [facility], we would like to switch to Plavix.  Would like to switch Prozac to something else but in discussion with pharmacy, it appears that there is a risk of decreased efficacy with Plavix and any SSRI.  Wil have Almyra Deforest contact PMD tomorrow to discuss if there is a possible substitute for Prozac.  If she rules out, plan d/c in AM.  [name] [name] S.

## 2013-07-18 NOTE — ED Provider Notes (Signed)
CSN: [id number]     Arrival date & time 07/18/13  1130 History   First MD Initiated Contact with Patient 07/18/13 1239     Chief Complaint  Patient presents with  . Chest Pain     (Consider location/radiation/quality/duration/timing/severity/associated sxs/prior Treatment) The history is provided by the patient. No language interpreter was used.  [name] [name] is a 68 year old female with past medical history of diabetes, chronic back pain, migraine, ventral hernia, GERD, obesity, hyperlipidemia, hypertension, CAD, shortness of breath, anxiety, PVD, chronic kidney disease, CABG x5, gastric bypass surgery, stent placement in March of 2015 in May 2015 presenting to the ED with sudden onset of chest pain that occurred while patient was in cardiac rehabilitation earlier today. As per patient, reported that while on the treadmill she had sudden onset of center chest pain described as a tight, gritty sensation without radiation with associated shortness of breath. Patient reported that this pain lasted for approximately 3-4 minutes. Stated that she felt mildly dizzy-as per patient, reported that she was told by the nurse next are that she became hypotensive. Stated that she was only walking very slow on the treadmill. Stated that she took an aspirin and Brilinta earlier this morning. Denied diaphoresis, syncope, nausea, vomiting, diarrhea, neck pain, jaw pain. PCP Dr. Sandi Mariscal Cardiologist Dr. [name]  Past Medical History  Diagnosis Date  . Vomiting     persistent  . Asthma   . Ventral hernia     hx of  . GERD (gastroesophageal reflux disease)   . Obesity 01-2010  . Hyperlipidemia   . Hypertension   . Irregular heart beat   . Coronary artery disease   . Heart murmur   . Shortness of breath   . Anxiety   . PONV (postoperative nausea and vomiting)   . Peripheral vascular disease     ? numbness, tingling arms and legs  . Anemia     hx  . H/O hiatal hernia   . Neuromuscular disorder    ?  Marland Kitchen Dysrhythmia   . Mental disorder     hx of confusion  . Chronic kidney disease     frequency  . CHF (congestive heart failure)   . Anginal pain   . Myocardial infarction     04/1999, 02/2000, 01/2005; 2011; 2014  . Pneumonia 2000's    "once"  . Chronic bronchitis     "off and on all the time" (06/13/2013)  . Claustrophobia   . Type II diabetes mellitus   . Chronic lower back pain   . Other and unspecified angina pectoris   . Stroke 03/22/12    right side brain; denies residual on 06/13/2013)  . Obstructive sleep apnea     "can't wear machine; I have claustrophobia" (06/13/2013)  . Migraine     "used to have them really bad; kind of eased up now; down to 1 q couple months" (06/13/2013)  . Headache     "probably 4 days/wk" (06/13/2013)  . Osteoarthritis     "knees and hands" (06/13/2013)   Past Surgical History  Procedure Laterality Date  . Tumor removal  1970's    ovarian  . Tibia fracture surgery Right 1970's    rods and pins  . Gastric bypass  1984    "stapeling"  . Total abdominal hysterectomy  1970's  . Ankle fracture surgery Left 1970's  . Fracture surgery    . Esophagogastroduodenoscopy  08/03/2011    Procedure: ESOPHAGOGASTRODUODENOSCOPY (EGD);  Surgeon: Shann Medal, MD;  Location: [facility];  Service: General;  Laterality: N/A;  . Gall stone removal  05/2003  . Root canal  10/2000  . Left heart cath  10/10/12    Dr [name].  . Coronary artery bypass graft  1220/11    "CABG X5"  . Appendectomy  1970's    w/hysterectomy  . Cholecystectomy  ?1980's  . Hernia repair  ?36's X2    "in my stomach; had OR on it twice" (04/24/2013)  . Coronary angioplasty with stent placement  01,02,05,06,07,08,11; 04/24/2013    "I've probably got ~ 10 stents by now" (04/24/2013)  . Coronary angioplasty with stent placement  06/13/2013    "got 4 stents today" (06/13/2013)   Family History  Problem Relation Age of Onset  . Heart disease Father     Heart Disease before age 10  .  Diabetes Father   . Hyperlipidemia Father   . Hypertension Father   . Heart attack Father   . Deep vein thrombosis Father   . Diabetes      family hx of paternal grandmother, aunt and grandfather- uncles  . Hypertension Mother   . Cancer Sister    History  Substance Use Topics  . Smoking status: Never Smoker   . Smokeless tobacco: Never Used  . Alcohol Use: No   OB History   Grav Para Term Preterm Abortions TAB SAB Ect Mult Living                 Review of Systems  Constitutional: Negative for fever and chills.  Respiratory: Positive for shortness of breath. Negative for chest tightness.   Cardiovascular: Positive for chest pain.  Gastrointestinal: Negative for nausea and vomiting.  Musculoskeletal: Negative for neck pain and neck stiffness.  Neurological: Negative for weakness and headaches.      Allergies  Amoxicillin; Erythromycin; Penicillins; Metformin and related; Tape; and Isosorbide mononitrate  Home Medications   Prior to Admission medications   Medication Sig Start Date End Date Taking? Authorizing Provider  albuterol (PROVENTIL) (2.5 MG/3ML) 0.083% nebulizer solution Take 2.5 mg by nebulization every 6 (six) hours as needed for shortness of breath (when asthma is active).    Yes Historical Provider, MD  albuterol (PROVENTIL,VENTOLIN) 90 MCG/ACT inhaler Inhale 2 puffs into the lungs every 6 (six) hours as needed for shortness of breath.    Yes Historical Provider, MD  ALPRAZolam Duanne Moron) 0.5 MG tablet Take 0.5 mg by mouth 2 (two) times daily as needed for anxiety. For anxiety - Patient takes 1 in the morning and 1 in the evening daily. If needed she may take one in the afternoon.   Yes Historical Provider, MD  amLODipine (NORVASC) 10 MG tablet Take 1 tablet (10 mg total) by mouth daily. 10/04/12  Yes Larey Dresser, MD  aspirin EC 81 MG tablet Take 81 mg by mouth daily.   Yes Historical Provider, MD  B Complex-C (B-COMPLEX WITH VITAMIN C) tablet Take 1 tablet by  mouth daily.   Yes Historical Provider, MD  calcium citrate (CALCITRATE - DOSED IN MG ELEMENTAL CALCIUM) 950 MG tablet Take 200 mg of elemental calcium by mouth daily.   Yes Historical Provider, MD  Cholecalciferol (VITAMIN D) 2000 UNITS tablet Take 2,000 Units by mouth daily.   Yes Historical Provider, MD  cholestyramine Lucrezia Starch) 4 GM/DOSE powder Take 2 g by mouth 2 (two) times daily with a meal. (mixed in juice or light kool-aid). 03/13/13  Yes Larey Dresser, MD  cloNIDine (CATAPRES) 0.2 MG  tablet Take 0.2 mg by mouth 2 (two) times daily.   Yes Historical Provider, MD  ezetimibe (ZETIA) 10 MG tablet Take 1 tablet (10 mg total) by mouth daily. 07/04/13  Yes Scott T Kathlen Mody, PA-C  FLUoxetine (PROZAC) 20 MG tablet Take 30 mg by mouth every morning.   Yes Historical Provider, MD  Fluticasone-Salmeterol (ADVAIR DISKUS) 250-50 MCG/DOSE AEPB Inhale 1 puff into the lungs every 12 (twelve) hours.    Yes Historical Provider, MD  furosemide (LASIX) 40 MG tablet Take 40 mg by mouth every morning.   Yes Historical Provider, MD  furosemide (LASIX) 40 MG tablet Take 20-40 mg by mouth every evening. Take 20mg  every evening but may take a whole tablet of 40mg  in the evening depending on swelling.   Yes Historical Provider, MD  gabapentin (NEURONTIN) 600 MG tablet Take 600 mg by mouth at bedtime.    Yes Historical Provider, MD  HUMALOG KWIKPEN 100 UNIT/ML SOPN Inject 5 Units into the skin 2 (two) times daily with a meal.  09/19/12  Yes Historical Provider, MD  hydrALAZINE (APRESOLINE) 50 MG tablet Take 50 mg by mouth 3 (three) times daily as needed (for high blood pressure). If BP is >175   Yes Historical Provider, MD  HYDROcodone-acetaminophen (NORCO/VICODIN) 5-325 MG per tablet Take 1 tablet by mouth every evening.    Yes Historical Provider, MD  insulin glargine (LANTUS) 100 UNIT/ML injection Inject 48 Units into the skin every morning.    Yes Historical Provider, MD  insulin glargine (LANTUS) 100 UNIT/ML injection  Inject 4-16 Units into the skin at bedtime as needed (For blood sugar = or >165).   Yes Historical Provider, MD  ipratropium-albuterol (DUONEB) 0.5-2.5 (3) MG/3ML SOLN Take 3 mLs by nebulization as needed (If severe asthma , use every 4 hours for 10 days).    Yes Historical Provider, MD  loperamide (IMODIUM) 2 MG capsule Take 2 mg by mouth as needed for diarrhea or loose stools.   Yes Historical Provider, MD  losartan (COZAAR) 100 MG tablet Take 100 mg by mouth 2 (two) times daily.    Yes Historical Provider, MD  Magnesium Oxide 250 MG TABS Take 250 mg by mouth 2 (two) times daily.    Yes Historical Provider, MD  metoprolol tartrate (LOPRESSOR) 25 MG tablet Take 12.5 mg by mouth 2 (two) times daily.  07/16/13  Yes Liliane Shi, PA-C  Multiple Vitamin (MULTIVITAMIN) tablet Take 1 tablet by mouth daily.     Yes Historical Provider, MD  nitroGLYCERIN (NITROSTAT) 0.3 MG SL tablet Place 0.3 mg under the tongue every 5 (five) minutes x 3 doses as needed for chest pain.    Yes Historical Provider, MD  omeprazole (PRILOSEC) 20 MG capsule Take 20 mg by mouth daily.  03/15/13  Yes Historical Provider, MD  ondansetron (ZOFRAN) 4 MG tablet Take 1 tablet (4 mg total) by mouth 2 (two) times daily as needed. For nausea 03/21/13  Yes Samantha J Rhyne, PA-C  ranolazine (RANEXA) 1000 MG SR tablet Take 1 tablet (1,000 mg total) by mouth 2 (two) times daily. 07/04/13  Yes Liliane Shi, PA-C  rosuvastatin (CRESTOR) 40 MG tablet Take 40 mg by mouth daily.    Yes Historical Provider, MD  ticagrelor (BRILINTA) 90 MG TABS tablet Take 1 tablet (90 mg total) by mouth 2 (two) times daily. 07/04/13  Yes Scott T Kathlen Mody, PA-C  topiramate (TOPAMAX) 50 MG tablet Take 1 tablet (50 mg total) by mouth 2 (two) times daily. 01/09/13  Yes Dennie Bible, NP  triamterene-hydrochlorothiazide (DYAZIDE) 37.5-25 MG per capsule Take 1 capsule by mouth daily as needed (For extra fluid).   Yes Historical Provider, MD  vitamin B-12  (CYANOCOBALAMIN) 1000 MCG tablet Take 1,000 mcg by mouth daily.   Yes Historical Provider, MD  zinc gluconate 50 MG tablet Take 50 mg by mouth daily.   Yes Historical Provider, MD  zolpidem (AMBIEN) 10 MG tablet Take 10 mg by mouth at bedtime as needed for sleep.    Yes Historical Provider, MD   BP 142/48  Pulse 51  Temp(Src) 97.7 F (36.5 C) (Oral)  Resp 16  Wt 268 lb 15.4 oz (122 kg)  SpO2 97% Physical Exam  Nursing note and vitals reviewed. Constitutional: She is oriented to person, place, and time. She appears well-developed and well-nourished. No distress.  HENT:  Head: Normocephalic and atraumatic.  Mouth/Throat: Oropharynx is clear and moist. No oropharyngeal exudate.  Eyes: Conjunctivae and EOM are normal. Pupils are equal, round, and reactive to light. Right eye exhibits no discharge. Left eye exhibits no discharge.  Neck: Normal range of motion. Neck supple. No tracheal deviation present.  Cardiovascular: Normal rate, regular rhythm and normal heart sounds.  Exam reveals no friction rub.   No murmur heard. Pulses:      Radial pulses are 2+ on the right side, and 2+ on the left side.       Dorsalis pedis pulses are 2+ on the right side, and 2+ on the left side.  Negative swelling or pitting edema identified to lower extremities bilaterally  Pulmonary/Chest: Effort normal and breath sounds normal. No respiratory distress. She has no wheezes. She has no rales.  Negative respiratory distress Patient is able to speak in full senses that difficulty Negative use of accessory muscles  Abdominal:  Obese  Musculoskeletal: Normal range of motion.  Full ROM to upper and lower extremities without difficulty noted, negative ataxia noted.  Lymphadenopathy:    She has no cervical adenopathy.  Neurological: She is alert and oriented to person, place, and time. No cranial nerve deficit. She exhibits normal muscle tone. Coordination normal.  Cranial nerves III-XII grossly intact Strength  5+/5+ to upper and lower extremities bilaterally with resistance applied, equal distribution noted Equal grip strength Negative facial drooping Negative slurred speech Negative aphasia  Skin: Skin is warm and dry. No rash noted. She is not diaphoretic. No erythema.  Psychiatric: She has a normal mood and affect. Her behavior is normal. Thought content normal.    ED Course  Procedures (including critical care time)  2;13 PM This provider spoke with [facility] - discussed case, history, labs and EKG in great detail. Cardiology to see patient.  4:43 PM Dr. Irish Lack, cardiologist, at bedside assessing patient.   Results for orders placed during the hospital encounter of 07/18/13  CBC      Result Value Ref Range   WBC 8.9  4.0 - 10.5 K/uL   RBC 4.34  3.87 - 5.11 MIL/uL   Hemoglobin 13.8  12.0 - 15.0 g/dL   HCT 40.5  36.0 - 46.0 %   MCV 93.3  78.0 - 100.0 fL   MCH 31.8  26.0 - 34.0 pg   MCHC 34.1  30.0 - 36.0 g/dL   RDW 12.7  11.5 - 15.5 %   Platelets 249  150 - 400 K/uL  BASIC METABOLIC PANEL      Result Value Ref Range   Sodium 140  137 - 147 mEq/L  Potassium 5.1  3.7 - 5.3 mEq/L   Chloride 99  96 - 112 mEq/L   CO2 25  19 - 32 mEq/L   Glucose, Bld 180 (*) 70 - 99 mg/dL   BUN 27 (*) 6 - 23 mg/dL   Creatinine, Ser 1.19 (*) 0.50 - 1.10 mg/dL   Calcium 10.0  8.4 - 10.5 mg/dL   GFR calc non Af Amer 47 (*) >90 mL/min   GFR calc Af Amer 54 (*) >90 mL/min  TROPONIN I      Result Value Ref Range   Troponin I <0.30  <0.30 ng/mL  HEPATIC FUNCTION PANEL      Result Value Ref Range   Total Protein 7.3  6.0 - 8.3 g/dL   Albumin 3.8  3.5 - 5.2 g/dL   AST 21  0 - 37 U/L   ALT 20  0 - 35 U/L   Alkaline Phosphatase 84  39 - 117 U/L   Total Bilirubin 0.8  0.3 - 1.2 mg/dL   Bilirubin, Direct <0.2  0.0 - 0.3 mg/dL   Indirect Bilirubin NOT CALCULATED  0.3 - 0.9 mg/dL  I-STAT TROPOININ, ED      Result Value Ref Range   Troponin i, poc 0.05  0.00 - 0.08 ng/mL   Comment 3              Labs Review Labs Reviewed  BASIC METABOLIC PANEL - Abnormal; Notable for the following:    Glucose, Bld 180 (*)    BUN 27 (*)    Creatinine, Ser 1.19 (*)    GFR calc non Af Amer 47 (*)    GFR calc Af Amer 54 (*)    All other components within normal limits  CBC  TROPONIN I  HEPATIC FUNCTION PANEL  I-STAT TROPOININ, ED    Imaging Review Dg Chest 2 View  07/18/2013   CLINICAL DATA:  Chest pain and exertional shortness of breath.  EXAM: CHEST  2 VIEW  COMPARISON:  PA and lateral chest x-ray of March 06, 2012  FINDINGS: The lungs are well-expanded and clear. The cardiac silhouette is top-normal in size but stable. The patient has undergone previous CABG as well as coronary stent placement. There are 7 intact sternal wires. There is no pleural effusion. The bony thorax is unremarkable.  IMPRESSION: There is no acute cardiopulmonary abnormality.   Electronically Signed   By: David  Martinique   On: 07/18/2013 14:23     EKG Interpretation None      MDM   Final diagnoses:  Coronary atherosclerosis of unspecified type of vessel, native or graft  TIA (transient ischemic attack)    Medications  aspirin chewable tablet 243 mg (243 mg Oral Given 07/18/13 1344)   Filed Vitals:   07/18/13 1630 07/18/13 1700 07/18/13 1706 07/18/13 1730  BP: 149/60 144/60 143/56 142/48  Pulse: 54 56  51  Temp:   97.7 F (36.5 C)   TempSrc:   Oral   Resp: 12 15 18 16   Weight:      SpO2: 96% 100% 97% 97%   This provider reviewed patient's chart. Patient has had a left cardiac cath performed in May 2015 with severe native coronary artery disease. Occluded distal right coronary artery was identified with a drug-eluting stent placed. Procedure performed by Dr. Irish Lack.  EKG noted sinus bradycardia with a heart rate of 59 beats per minute with a left bundle branch block, LBBB chronic. Troponin negative elevation. CBC negative elevated white blood cell  count. BMP noted elevated BUN of 27, creatinine  1.19. Hepatic function panel negative findings. Chest x-ray negative for acute cardiac pulmonary abnormalities. Patient seen and assessed by cardiology, patient to be admitted under the care of cardiology regarding atypical presentation of chest pain and strong cardiac history. Patient agreed to plan of admission. Patient stable for transfer.    Jamse Mead, PA-C 07/18/13 1827

## 2013-07-18 NOTE — ED Notes (Signed)
Pt arrives via stretcher from cardiac rehab unit for stent placement back in May. Told RN after exercising she had substernal chest pain while exercising. REhab checked vitals, pt noted to be slightly hypotensive after exercise 96/68 and advised patient to come here for further eval. Pt anxious appearance, hyperventilating. Calms with reassurance. Pts medications adjusted Monday. Pt alert, skin, warm, dry, VSS.

## 2013-07-18 NOTE — ED Notes (Signed)
Family at bedside. 

## 2013-07-18 NOTE — Progress Notes (Signed)
Patient reported having chest pain on the treadmill today after exercise had ended. [name] said she was having chest pain for about 3-4 minutes at a level 4 but did not notify staff at the time. Patient is currently pain free.  Initial blood pressure 98/68 then 104/60 after given water. [name] called and notified of the patient complaints. [name] discussed with Dr Irish Lack who recommends that the patient go to the ED for further evaluation. Patient called and notifed her husband. Patient transported to the ED via stretcher on the [place]. Report given to the ED RN.

## 2013-07-18 NOTE — ED Notes (Signed)
Phlebotomy at bedside.

## 2013-07-19 ENCOUNTER — Telehealth: Payer: Self-pay | Admitting: Cardiology

## 2013-07-19 ENCOUNTER — Telehealth (HOSPITAL_COMMUNITY): Payer: Self-pay | Admitting: *Deleted

## 2013-07-19 DIAGNOSIS — R0789 Other chest pain: Secondary | ICD-10-CM

## 2013-07-19 LAB — BASIC METABOLIC PANEL
BUN: 27 mg/dL — ABNORMAL HIGH (ref 6–23)
CO2: 27 mEq/L (ref 19–32)
Calcium: 9.6 mg/dL (ref 8.4–10.5)
Chloride: 99 mEq/L (ref 96–112)
Creatinine, Ser: 1.17 mg/dL — ABNORMAL HIGH (ref 0.50–1.10)
GFR calc Af Amer: 55 mL/min — ABNORMAL LOW (ref >90)
GFR calc non Af Amer: 48 mL/min — ABNORMAL LOW (ref >90)
Glucose, Bld: 229 mg/dL — ABNORMAL HIGH (ref 70–99)
Potassium: 4.7 mEq/L (ref 3.7–5.3)
Sodium: 140 mEq/L (ref 137–147)

## 2013-07-19 LAB — LIPID PANEL
Cholesterol: 210 mg/dL — ABNORMAL HIGH (ref 0–200)
HDL: 37 mg/dL — ABNORMAL LOW (ref >39)
LDL Cholesterol: 100 mg/dL — ABNORMAL HIGH (ref 0–99)
Total CHOL/HDL Ratio: 5.7 RATIO
Triglycerides: 363 mg/dL — ABNORMAL HIGH (ref <150)
VLDL: 73 mg/dL — ABNORMAL HIGH (ref 0–40)

## 2013-07-19 LAB — GLUCOSE, CAPILLARY
Glucose-Capillary: 195 mg/dL — ABNORMAL HIGH (ref 70–99)
Glucose-Capillary: 166 mg/dL — ABNORMAL HIGH (ref 70–99)

## 2013-07-19 LAB — HEMOGLOBIN A1C
Hgb A1c MFr Bld: 7.2 % — ABNORMAL HIGH (ref <5.7)
Mean Plasma Glucose: 160 mg/dL — ABNORMAL HIGH (ref <117)

## 2013-07-19 LAB — TROPONIN I
Troponin I: <0.3 ng/mL (ref <0.30)
Troponin I: <0.3 ng/mL (ref <0.30)

## 2013-07-19 NOTE — Progress Notes (Signed)
I have called the patient's PCP Dr. Deboraha Sprang office at [phone]. I have discussed with Dr. Deboraha Sprang office staff. They will contact patient after discharge to discuss potential change to her Prozac as it can potentially decrease effectiveness of plavix which was the reason her plavix was changed to brilinta. (unable to take effient due to h/o stroke) However brilinta can also cause increased dyspnea.   If patient prozac can be changed to non-SSRI, can potentially consider switch her back to plavix to see if her symptom improve. Patient will follow up with Dr. [name] who will decide whether to change Brilinta  Signed, Almyra Deforest PA Pager: [phone]

## 2013-07-19 NOTE — Telephone Encounter (Signed)
Spoke with pt, aware will forward for dr mclean's review. The pt has a follow up 08-14-13. Pt agreed with this plan.

## 2013-07-19 NOTE — Telephone Encounter (Signed)
Patient needs note to return back to cardiac rehab, please call and advise.

## 2013-07-19 NOTE — Discharge Summary (Signed)
Physician Discharge Summary  Patient ID: [name] [name] [name]: [id number] DOB/AGE: [birth year] 68 y.o.  Admit date: 07/18/2013 Discharge date: 07/19/2013  Admission Diagnoses:    Chest pain, atypical  Discharge Diagnoses:  Active Problems:   Atypical chest pain  1. Chest pain, 2. Coronary artery disease status post 5-vessel bypass surgery in 2001  3. Chronic diastolic heart failure  4. OSA intolerant to CPAP  5. history of CVA  6. carotid artery disease s/p L carotid stent 03/2013  7. Hypertension  8. Dyslipidemia  9. chronic kidney disease: SCr stable  10. chronic left bundle branch block  11. Intolerance to Imdur due to LE cramps  12. Bradycardia  13. Obesity:   Discharged Condition: stable  Hospital Course:   68 year old morbidly obese Caucasian female with PMH significant for coronary artery disease status post 5-vessel bypass surgery in [id number], diastolic heart failure, OSA intolerant to CPAP, history of CVA, carotid artery disease s/p L carotid stent, hypertension, dyslipidemia, chronic kidney disease and chronic left bundle branch block. She had a cardiac catheterization in September 2014 which showed patent SVG to Diag, LIMA to LAD and sequential SVG to OM. However it was noted her SVG to RCA and native RCA were both occluded at the time. She was having increasing dyspnea with exertion and was felt to be related to the occlusion of her RCA. She underwent a CTO cath on 04/24/2013 with attempt to open up the chronically occluded RCA. A cutting balloon was used to open up the proximal RCA with 25% residual, however mid to distal RCA had diffuse disease and also distal RCA coronary dissection which was unable to cross with wire. Patient was discharged home to allow dissection to heal over time. She was brought back to the cath lab for repeat CTO on 06/13/2013. A total of 4 drug-eluting stents were placed in the RCA. Her Plavix was changed to Effient at the time of her discharge due to possible  interaction of Plavix with her Prozac. She was continued on aspirin and Effient for 3 weeks afterward, however Effient was eventually switched to Brilinta as she had a history of CVA.   Since her CTO, patient has continued to have some mild exertional chest discomfort and shortness of breath associated with exertion. It is relieved with rest. Interestingly, the presentation of her original MI was jaw pain radiating to bilateral shoulders, mild chest discomfort and nausea. She denies any recurrence of jaw pain or shoulder pain since last cardiac cath. She states she has been compliant with her medication since her discharge. Patient was seen by out APP in cardiology clinic on 07/04/2013, at which time, she was doing well however continued to have some exertional dyspnea along with chest discomfort during exercise. The only relieving factor is rest. She denies any recent fever, chill, cough, GI discomfort, or bleeding problem. She has chronic lower extremity swelling however has not noticed any significant increased recently. She sleeps on an incline and has not experienced any paroxysmal nocturnal dyspnea recently. Her metoprolol was recently cut down to 12.5 mg twice a day due to bradycardia by out APP, however patient did not decrease her metoprolol until 07/16/2013.   Patient was starting her cardiac rehabilitation exercise on 07/18/2013 when she experienced significant dyspnea along with substernal chest pressure/palpitation similar to what she has been feeling since her discharge. However, this time patient was eager to continue to exercise, and therefore she did not inform to nurse of her symptoms. Chest discomfort lasted about 3-4  minute and quickly improved with rest. However it did not completely went away and would come on and off for one to 2 minute at a time. She eventually told the cardiac rehabilitation nurse. Her blood pressure was noted to be in the 80s, however with fluid intake, her BP returned to  >100. Rehab nurse contacted cardiology group who recommended patient to seek further evaluation at [facility] ED. On presentation, she was noted to be normotensive with blood pressure in the 120s. Bradycardic with heart rate in the 50s. O2 saturation 100% room air. EKG was noted to have chronic left bundle branch block. Chest x-ray was negative for acute process. Cardiology was consulted for further evaluation.  She was admitted and ruled out for MI.  Dyspnea may be related to brilinta and hopefully will pass.  No changes to medical therapy.  Her lipid panel is grossly abnormal.  Dietary changes were discussed and she will be referred to an out-patient register Dietician.  The patient was seen by Dr. Johnsie Cancel who felt she was stable for DC home.   Consults: None  Significant Diagnostic Studies: Lipid Panel     Component Value Date/Time   CHOL 210* 07/19/2013 0025   TRIG 363* 07/19/2013 0025   HDL 37* 07/19/2013 0025   CHOLHDL 5.7 07/19/2013 0025   VLDL 73* 07/19/2013 0025   LDLCALC 100* 07/19/2013 0025    Treatments: See above  Discharge Exam: Blood pressure 138/66, pulse 61, temperature 97.5 F (36.4 C), temperature source Oral, resp. rate 18, height 5\' 2"  (1.575 m), weight 268 lb 15.4 oz (122 kg), SpO2 97.00%.   Disposition: 01-Home or Self Care  Discharge Instructions   Diet - low sodium heart healthy    Complete by:  As directed      Discharge instructions    Complete by:  As directed   Weigh daily.  CAll the office for instructions if your weight increases by 2 pounds in 24 hours or 5 pounds in a week.     Increase activity slowly    Complete by:  As directed             Medication List         ADVAIR DISKUS 250-50 MCG/DOSE Aepb  Generic drug:  Fluticasone-Salmeterol  Inhale 1 puff into the lungs every 12 (twelve) hours.     albuterol (2.5 MG/3ML) 0.083% nebulizer solution  Commonly known as:  PROVENTIL  Take 2.5 mg by nebulization every 6 (six) hours as needed for  shortness of breath (when asthma is active).     albuterol 90 MCG/ACT inhaler  Commonly known as:  PROVENTIL,VENTOLIN  Inhale 2 puffs into the lungs every 6 (six) hours as needed for shortness of breath.     ALPRAZolam 0.5 MG tablet  Commonly known as:  XANAX  Take 0.5 mg by mouth 2 (two) times daily as needed for anxiety. For anxiety - Patient takes 1 in the morning and 1 in the evening daily. If needed she may take one in the afternoon.     AMBIEN 10 MG tablet  Generic drug:  zolpidem  Take 10 mg by mouth at bedtime as needed for sleep.     amLODipine 10 MG tablet  Commonly known as:  NORVASC  Take 1 tablet (10 mg total) by mouth daily.     aspirin EC 81 MG tablet  Take 81 mg by mouth daily.     B-complex with vitamin C tablet  Take 1 tablet by mouth  daily.     calcium citrate 950 MG tablet  Commonly known as:  CALCITRATE - dosed in mg elemental calcium  Take 200 mg of elemental calcium by mouth daily.     cholestyramine 4 GM/DOSE powder  Commonly known as:  QUESTRAN  Take 2 g by mouth 2 (two) times daily with a meal. (mixed in juice or light kool-aid).     cloNIDine 0.2 MG tablet  Commonly known as:  CATAPRES  Take 0.2 mg by mouth 2 (two) times daily.     ezetimibe 10 MG tablet  Commonly known as:  ZETIA  Take 1 tablet (10 mg total) by mouth daily.     FLUoxetine 20 MG tablet  Commonly known as:  PROZAC  Take 30 mg by mouth every morning.     furosemide 40 MG tablet  Commonly known as:  LASIX  Take 40 mg by mouth every morning.     furosemide 40 MG tablet  Commonly known as:  LASIX  Take 20-40 mg by mouth every evening. Take 20mg  every evening but may take a whole tablet of 40mg  in the evening depending on swelling.     gabapentin 600 MG tablet  Commonly known as:  NEURONTIN  Take 600 mg by mouth at bedtime.     HUMALOG KWIKPEN 100 UNIT/ML KiwkPen  Generic drug:  insulin lispro  Inject 5 Units into the skin 2 (two) times daily with a meal.      hydrALAZINE 50 MG tablet  Commonly known as:  APRESOLINE  Take 50 mg by mouth 3 (three) times daily as needed (for high blood pressure). If BP is >175     HYDROcodone-acetaminophen 5-325 MG per tablet  Commonly known as:  NORCO/VICODIN  Take 1 tablet by mouth every evening.     insulin glargine 100 UNIT/ML injection  Commonly known as:  LANTUS  Inject 48 Units into the skin every morning.     insulin glargine 100 UNIT/ML injection  Commonly known as:  LANTUS  - Inject 4-16 Units into the skin at bedtime as needed (For blood sugar = or >150). Patient takes:  - 0 units if BG <150,   - 4 units if BG 150-174,   - 8 units if BG 175-199,   - 16 units if BG >200     ipratropium-albuterol 0.5-2.5 (3) MG/3ML Soln  Commonly known as:  DUONEB  Take 3 mLs by nebulization as needed (If severe asthma , use every 4 hours for 10 days).     loperamide 2 MG capsule  Commonly known as:  IMODIUM  Take 2 mg by mouth as needed for diarrhea or loose stools.     losartan 100 MG tablet  Commonly known as:  COZAAR  Take 100 mg by mouth 2 (two) times daily.     Magnesium Oxide 250 MG Tabs  Take 250 mg by mouth 2 (two) times daily.     metoprolol tartrate 25 MG tablet  Commonly known as:  LOPRESSOR  Take 12.5 mg by mouth 2 (two) times daily.     multivitamin tablet  Take 1 tablet by mouth daily.     nitroGLYCERIN 0.3 MG SL tablet  Commonly known as:  NITROSTAT  Place 0.3 mg under the tongue every 5 (five) minutes x 3 doses as needed for chest pain.     omeprazole 20 MG capsule  Commonly known as:  PRILOSEC  Take 20 mg by mouth daily.     ondansetron 4 MG  tablet  Commonly known as:  ZOFRAN  Take 1 tablet (4 mg total) by mouth 2 (two) times daily as needed. For nausea     ranolazine 1000 MG SR tablet  Commonly known as:  RANEXA  Take 1 tablet (1,000 mg total) by mouth 2 (two) times daily.     rosuvastatin 40 MG tablet  Commonly known as:  CRESTOR  Take 40 mg by mouth daily.      ticagrelor 90 MG Tabs tablet  Commonly known as:  BRILINTA  Take 1 tablet (90 mg total) by mouth 2 (two) times daily.     topiramate 50 MG tablet  Commonly known as:  TOPAMAX  Take 1 tablet (50 mg total) by mouth 2 (two) times daily.     triamterene-hydrochlorothiazide 37.5-25 MG per capsule  Commonly known as:  DYAZIDE  Take 1 capsule by mouth daily as needed (For extra fluid).     vitamin B-12 1000 MCG tablet  Commonly known as:  CYANOCOBALAMIN  Take 1,000 mcg by mouth daily.     Vitamin D 2000 UNITS tablet  Take 2,000 Units by mouth daily.     zinc gluconate 50 MG tablet  Take 50 mg by mouth daily.           Follow-up Information   Follow up with Loralie Champagne, MD On 08/14/2013. (2:15 PM)    Specialty:  Cardiology   Contact information:   [id number] [place] [address] [phone]      Greater than 30 minutes was spent completing the patient's discharge.    SignedTarri Fuller, [place] 07/19/2013, 9:19 AM

## 2013-07-19 NOTE — Progress Notes (Signed)
Subjective: Some chest twinges  Objective: Vital signs in last 24 hours: Temp:  [97.4 F (36.3 C)-98 F (36.7 C)] 97.5 F (36.4 C) (06/18 0431) Pulse Rate:  [51-67] 61 (06/18 0431) Resp:  [12-26] 18 (06/18 0431) BP: (126-174)/(48-83) 138/66 mmHg (06/18 0431) SpO2:  [95 %-100 %] 97 % (06/18 0431) Weight:  [268 lb 15.4 oz (122 kg)] 268 lb 15.4 oz (122 kg) (06/17 1137) Last BM Date: 07/18/13  Intake/Output from previous day:   Intake/Output this shift:    Medications Current Facility-Administered Medications  Medication Dose Route Frequency Provider Last Rate Last Dose  . albuterol (PROVENTIL) (2.5 MG/3ML) 0.083% nebulizer solution 2.5 mg  2.5 mg Nebulization Q6H PRN Almyra Deforest, PA      . ALPRAZolam Duanne Moron) tablet 0.5 mg  0.5 mg Oral BID PRN Almyra Deforest, PA      . amLODipine (NORVASC) tablet 10 mg  10 mg Oral Daily Almyra Deforest, [place]      . aspirin EC tablet 81 mg  81 mg Oral Daily Almyra Deforest, [place]      . atorvastatin (LIPITOR) tablet 80 mg  80 mg Oral q1800 Almyra Deforest, [place]      . B-complex with vitamin C tablet 1 tablet  1 tablet Oral Daily Almyra Deforest, [place]      . calcium citrate (CALCITRATE - dosed in mg elemental calcium) tablet 200 mg of elemental calcium  200 mg of elemental calcium Oral Daily Almyra Deforest, PA      . cholecalciferol (VITAMIN D) tablet 2,000 Units  2,000 Units Oral Daily Almyra Deforest, PA      . cholestyramine Lucrezia Starch) packet 2 g  2 g Oral BID WC Almyra Deforest, PA      . cloNIDine (CATAPRES) tablet 0.2 mg  0.2 mg Oral BID Almyra Deforest, PA   0.2 mg at 07/18/13 2133  . ezetimibe (ZETIA) tablet 10 mg  10 mg Oral Daily Almyra Deforest, [place]   10 mg at 07/18/13 2132  . FLUoxetine (PROZAC) tablet 30 mg  30 mg Oral q morning - 10a Almyra Deforest, [place]      . furosemide (LASIX) tablet 40 mg  40 mg Oral q morning - 10a Almyra Deforest, PA       And  . furosemide (LASIX) tablet 20 mg  20 mg Oral QPM Almyra Deforest, PA      . gabapentin (NEURONTIN) tablet 600 mg  600 mg Oral QHS Almyra Deforest, PA   600 mg at 07/18/13 2132  . heparin  injection 5,000 Units  5,000 Units Subcutaneous 3 times per day Almyra Deforest, PA   5,000 Units at 07/19/13 0506  . hydrALAZINE (APRESOLINE) tablet 50 mg  50 mg Oral TID PRN Almyra Deforest, PA      . HYDROcodone-acetaminophen (NORCO/VICODIN) 5-325 MG per tablet 1 tablet  1 tablet Oral QPM Almyra Deforest, PA   1 tablet at 07/18/13 2153  . insulin aspart (novoLOG) injection 0-20 Units  0-20 Units Subcutaneous TID WC Almyra Deforest, PA      . insulin aspart (novoLOG) injection 5 Units  5 Units Subcutaneous BID WC Almyra Deforest, PA      . insulin glargine (LANTUS) injection 4-16 Units  4-16 Units Subcutaneous QHS PRN Almyra Deforest, PA      . insulin glargine (LANTUS) injection 48 Units  48 Units Subcutaneous q morning - 10a Almyra Deforest, PA      . ipratropium-albuterol (DUONEB) 0.5-2.5 (3) MG/3ML nebulizer solution 3 mL  3 mL  Nebulization Q4H PRN Almyra Deforest, PA      . loperamide (IMODIUM) capsule 2 mg  2 mg Oral PRN Almyra Deforest, PA      . losartan (COZAAR) tablet 100 mg  100 mg Oral BID Almyra Deforest, PA   100 mg at 07/18/13 2132  . magnesium oxide (MAG-OX) tablet 200 mg  200 mg Oral BID Almyra Deforest, PA   200 mg at 07/18/13 2132  . metoprolol tartrate (LOPRESSOR) tablet 12.5 mg  12.5 mg Oral BID Almyra Deforest, PA   12.5 mg at 07/18/13 2132  . mometasone-formoterol (DULERA) 100-5 MCG/ACT inhaler 2 puff  2 puff Inhalation BID Almyra Deforest, PA   2 puff at 07/18/13 2112  . multivitamin with minerals tablet 1 tablet  1 tablet Oral Daily Almyra Deforest, PA      . nitroGLYCERIN (NITROSTAT) SL tablet 0.4 mg  0.4 mg Sublingual Q5 Min x 3 PRN Almyra Deforest, PA      . pantoprazole (PROTONIX) EC tablet 40 mg  40 mg Oral Daily Almyra Deforest, [place]      . ranolazine (RANEXA) 12 hr tablet 1,000 mg  1,000 mg Oral BID Almyra Deforest, PA   1,000 mg at 07/18/13 2133  . ticagrelor (BRILINTA) tablet 90 mg  90 mg Oral BID Almyra Deforest, PA   90 mg at 07/18/13 2132  . topiramate (TOPAMAX) tablet 50 mg  50 mg Oral BID Almyra Deforest, PA   50 mg at 07/18/13 2133  . triamterene-hydrochlorothiazide (DYAZIDE) 37.5-25 MG  per capsule 1 capsule  1 capsule Oral Daily PRN Almyra Deforest, PA      . vitamin B-12 (CYANOCOBALAMIN) tablet 1,000 mcg  1,000 mcg Oral Daily Almyra Deforest, [place]      . zolpidem (AMBIEN) tablet 5 mg  5 mg Oral QHS PRN Almyra Deforest, PA   5 mg at 07/18/13 2142    PE: General appearance: alert, cooperative and no distress Lungs: clear to auscultation bilaterally Heart: regular rate and rhythm, S1, S2/3 normal, no murmur, click, rub or gallop Extremities: 1+ LEE Pulses: 2+ and symmetric Skin: Warm and dry Neurologic: Grossly normal  Lab Results:   Recent Labs  07/18/13 1213 07/18/13 2016  WBC 8.9 8.5  HGB 13.8 14.0  HCT 40.5 41.3  PLT 249 234   BMET  Recent Labs  07/18/13 1213 07/18/13 2016 07/19/13 0025  NA 140  --  140  K 5.1  --  4.7  CL 99  --  99  CO2 25  --  27  GLUCOSE 180*  --  229*  BUN 27*  --  27*  CREATININE 1.19* 1.12* 1.17*  CALCIUM 10.0  --  9.6   PT/INR No results found for this basename: LABPROT, INR,  in the last 72 hours Cholesterol  Recent Labs  07/19/13 0025  CHOL 210*   Lipid Panel     Component Value Date/Time   CHOL 210* 07/19/2013 0025   TRIG 363* 07/19/2013 0025   HDL 37* 07/19/2013 0025   CHOLHDL 5.7 07/19/2013 0025   VLDL 73* 07/19/2013 0025   LDLCALC 100* 07/19/2013 0025       Assessment/Plan  Active Problems: 1. Chest pain, present since last discharge, no change in frequency, related to exertion - troponin negative x 3  - She had some "twinges" since admission.  Not exertional.   -dyspnea possibly relate to Brilinta (previously off plavix due to interaction with her prozac, no effient with h/o stroke)  2. Coronary artery disease status  post 5-vessel bypass surgery in 2001  - Cath 10/2012 patent SVG to Diag, LIMA to LAD and sequential SVG to OM. SVG to RCA and native RCA were both occluded at the time  - CTO cath on 04/24/2013 with attempt to open up the chronically occluded RCA. A cutting balloon was used to open up the proximal RCA with 25%  residual, however mid to distal RCA had diffuse disease and also distal RCA coronary dissection which was unable to cross with wire.  - Repeat CTO on 06/13/2013 with 4 drug-eluting stents were placed in the RCA  3. Chronic diastolic heart failure  - Echo 09/28/2012 EF 0000000, grade 2 diastolic HF, no wall motion abnormalities.  4. OSA intolerant to CPAP  5. history of CVA  6. carotid artery disease s/p L carotid stent 03/2013  7. Hypertension   BP controlled for the most part.  No changes to therapy.  8. Dyslipidemia   Uncontrolled(See lipid panel above).  On crestor at home.  Dietician referral.  9.  chronic kidney disease:  SCr stable 10. chronic left bundle branch block  11. Intolerance to Imdur due to LE cramps 12. Bradycardia She had some slowing overnight on telemetry into the 30's secondary to a 2.28 sec pause.  On lopressor 12.5bid.  Not slow right now.    13.  Obesity:  We talked about referral to dietician and she appears receptive to it.    Disposition:  DC home today.  Noncardiac CP.    LOS: 1 day    HAGER, BRYAN PA-C 07/19/2013 7:44 AM  Patient examined chart reviewed agree with above Already has appt with Dr [name] first part of July Noncardiac Chest pain.  SEM on exam  Jenkins Rouge

## 2013-07-19 NOTE — Telephone Encounter (Signed)
Would hold off for about a week then can return.

## 2013-07-19 NOTE — Progress Notes (Signed)
UR Completed Emilya Justen Graves-Bigelow, RN,BSN [phone]  

## 2013-07-20 ENCOUNTER — Encounter (HOSPITAL_COMMUNITY): Payer: Medicare Other

## 2013-07-20 NOTE — Telephone Encounter (Signed)
Pt advised.

## 2013-07-20 NOTE — ED Provider Notes (Signed)
Medical screening examination/treatment/procedure(s) were conducted as a shared visit with non-physician practitioner(s) and myself.  I personally evaluated the patient during the encounter.   EKG Interpretation None      Date: 07/20/2013  Rate: 59  Rhythm: sinus bradycardia  QRS Axis: left  Intervals: LBBB  ST/T Wave abnormalities: nonspecific ST/T changes  Conduction Disutrbances:left bundle branch block  Narrative Interpretation:   Old EKG Reviewed: unchanged    Pt presents w/ CP at cardiac rehab in setting of known CAD. CP now resolved. EKG unchanged from prior. Trop not elevated. CXR nml. Cardiology has seen pt and will admit. On my exam, she is in NAD, w/o complaint. Lungs clear.   Neta Ehlers, MD 07/20/13 [phone]

## 2013-07-23 ENCOUNTER — Encounter (HOSPITAL_COMMUNITY): Payer: Medicare Other

## 2013-07-23 ENCOUNTER — Other Ambulatory Visit: Payer: Medicare Other

## 2013-07-23 NOTE — Telephone Encounter (Signed)
Pt states her chest pain is about the same as before she went to ED last week, she is extremely fatigued, and her SOB is unchanged. She does not feel like she could return to Cardiac Rehab this week, mainly because of fatigue.

## 2013-07-23 NOTE — Telephone Encounter (Signed)
Arrange appt with me this week.

## 2013-07-24 NOTE — Telephone Encounter (Signed)
Pt aware of appt with Dr [name] 07/26/13 10 AM.

## 2013-07-25 ENCOUNTER — Encounter (HOSPITAL_COMMUNITY): Payer: Medicare Other

## 2013-07-25 NOTE — Telephone Encounter (Signed)
Arrie Aran, MD     Sent: Tue July 17, 2013       To: Katrine Coho, RN        Message      ok

## 2013-07-26 ENCOUNTER — Ambulatory Visit (INDEPENDENT_AMBULATORY_CARE_PROVIDER_SITE_OTHER): Payer: Medicare Other | Admitting: Cardiology

## 2013-07-26 ENCOUNTER — Encounter: Payer: Self-pay | Admitting: Cardiology

## 2013-07-26 VITALS — BP 124/64 | HR 64 | Ht 62.0 in | Wt 267.0 lb

## 2013-07-26 DIAGNOSIS — I2510 Atherosclerotic heart disease of native coronary artery without angina pectoris: Secondary | ICD-10-CM

## 2013-07-26 DIAGNOSIS — I509 Heart failure, unspecified: Secondary | ICD-10-CM

## 2013-07-26 DIAGNOSIS — I252 Old myocardial infarction: Secondary | ICD-10-CM

## 2013-07-26 DIAGNOSIS — I209 Angina pectoris, unspecified: Secondary | ICD-10-CM

## 2013-07-26 DIAGNOSIS — E785 Hyperlipidemia, unspecified: Secondary | ICD-10-CM

## 2013-07-26 DIAGNOSIS — I5032 Chronic diastolic (congestive) heart failure: Secondary | ICD-10-CM

## 2013-07-26 DIAGNOSIS — R0600 Dyspnea, unspecified: Secondary | ICD-10-CM

## 2013-07-26 DIAGNOSIS — I10 Essential (primary) hypertension: Secondary | ICD-10-CM

## 2013-07-26 DIAGNOSIS — I059 Rheumatic mitral valve disease, unspecified: Secondary | ICD-10-CM

## 2013-07-26 DIAGNOSIS — R0609 Other forms of dyspnea: Secondary | ICD-10-CM

## 2013-07-26 DIAGNOSIS — R0989 Other specified symptoms and signs involving the circulatory and respiratory systems: Secondary | ICD-10-CM

## 2013-07-26 DIAGNOSIS — I340 Nonrheumatic mitral (valve) insufficiency: Secondary | ICD-10-CM

## 2013-07-26 LAB — BASIC METABOLIC PANEL
BUN: 37 mg/dL — ABNORMAL HIGH (ref 6–23)
CO2: 27 mEq/L (ref 19–32)
Calcium: 9.5 mg/dL (ref 8.4–10.5)
Chloride: 102 mEq/L (ref 96–112)
Creatinine, Ser: 1.3 mg/dL — ABNORMAL HIGH (ref 0.4–1.2)
GFR: 43.61 mL/min — ABNORMAL LOW (ref >60.00)
Glucose, Bld: 192 mg/dL — ABNORMAL HIGH (ref 70–99)
Potassium: 4.4 mEq/L (ref 3.5–5.1)
Sodium: 137 mEq/L (ref 135–145)

## 2013-07-26 LAB — BRAIN NATRIURETIC PEPTIDE: Pro B Natriuretic peptide (BNP): 39 pg/mL (ref 0.0–100.0)

## 2013-07-26 MED ORDER — CLOPIDOGREL BISULFATE 75 MG PO TABS: ORAL_TABLET | ORAL | Status: DC

## 2013-07-26 NOTE — Progress Notes (Signed)
Patient ID: [name] [name], female   DOB: 02/04/1945, 68 y.o.   [name]: [id number] PCP: Dr. Sandi Mariscal  68 yo with history of CAD s/p CABG, diastolic CHF, and cerebrovascular disease with history of CVA presents for cardiology followup. She had CABG x 5 in 12/11.  Prior to the CABG she had multiple PCIs.  She had a CVA in 2/14 that presented as visual loss.  She ended up getting a cerebral angiogram in 6/14 with 70% ostial right vertebral stenosis, > 70% stenosis proximal left posterior cerebral artery, and 123456 LICA stenosis.  She has been on ASA and Plavix.  She recently saw Dr. Scot Dock, and carotid dopplers suggested worsening of LICA stenosis to > 123XX123.  She has had episodes of transient expressive aphasia for several months.  She is planned for a carotid stent soon.  Given dyspnea and chest pain that was worsened from her baseline, I took [name] [name] for left heart catheterization in 9/14.  This showed patent SVG-D, LIMA-LAD, and sequential SVG-OM branches but SVG-RCA and the native RCA were both totally occluded.  I suspect that her increased symptoms coincided with occlusion of SVG-RCA.  I started her on Imdur to see if this would help with the chest pain and dyspnea.  However, she feels like Imdur causes leg cramps and does not think that she can take it.  I next started her on ranolazine 500 mg bid and titrated this up to 1000 mg bid.  This helped some but not markedly.  Therefore, I had her see Dr. Irish Lack to address opening RCA CTO. He was able to do this in 5/15 with 4 overlapping Xience DES.    [name] [name] was initially on Effient s/p PCI, but this was changed to Brilinta given stroke history.  Since starting on Brilinta several weeks ago, she seems to have done poorly.  Shortness of breath has considerably increased.  She is short of breath walking around her house or taking a bath.  She has occasional central chest pressure radiating to her right chest. This is not exertional.  No orthopnea or PND.   Weight is actually down 9 lbs compared to prior appointment.   ECG: NSR, RAE, LBBB  Labs (6/14): K 3.8, creatinine 0.71 Labs (8/14): BNP 249, LDL 166 Labs (9/14): K 4.7, creatinine 0.7 Labs (9/14): K 4.6, creatinine 0.9, BNP 109 Labs (1/15): K 4.5, creatinine 0.73, LDL 212, HCT 43.4, TSH normal, BNP 138 Labs (6/15): K 4.7, creatinine 1.17, LDL 100, HDL 37, TGs 363  PMH: 1. Diabetic gastroparesis 2. Type II diabetes 3. HTN 4. Morbid obesity 5. CAD: s/p CABG in 12/11 after prior PCIs.  LIMA-LAD, SVG-D, seq SVG-OM1 and OM2, SVG-PDA. Adenosine Cardiolite (8/14) with EF 53% and a small reversible apical defect with a medium, partially reversible inferior defect.  LHC (9/14) with patent SVG-D, LIMA-LAD (50% distal LAD), and sequential SVG-OM branches; the SVG-RCA and the native RCA were occluded.  This was managed medically initially, but with ongoing exertional chest pain, it was decided to open CTO.  Patient had CTO opening with 4 overlapping Xience DES in the RCA in 5/15.  .  6. Atypical migraines 7. OSA: Intolerant of CPAP.  8. GERD with hiatal hernia 9. OA 10. Diastolic CHF: Echo (A999333) with EF 50-55%, grade II diastolic dysfunction, mild-moderate MR.  Echo (8/14) with EF 55-60%, grade II diastolic dysfunction, mildly increased aortic valve gradient (mean 12 mmHg) but valve opens well, mild MR and mild RV dilation.  11. CKD  12. Chronic LBBB 13. Anxiety 14. Carotid stenosis: Followed by [facility], XX123456 LICA stenosis XX123456.  She had left carotid stent in 2/15.  15. Cerebrovascular disease: CVA 2/14 with right posterior cerebral artery territory ischemic infarction. Cerebral angiogram in 6/14 showed 70% right vertebral ostial stenosis, 123456 LICA stenosis, > XX123456 proximal left posterior cerebral artery stenosis, posterior communicating artery aneurysm.  Patient has had episodes of transient expressive aphasia.  Carotid dopplers (XX123456) showed XX123456 LICA stenosis.  16. Positional vertigo  (suspected) 17. Palpitations: Holter (6/15) with rare PVCs/PACs.   SH: Married, nonsmoker  FH: CAD  ROS: All systems reviewed and negative except as per HPI.   Current Outpatient Prescriptions  Medication Sig Dispense Refill  . albuterol (PROVENTIL) (2.5 MG/3ML) 0.083% nebulizer solution Take 2.5 mg by nebulization every 6 (six) hours as needed for shortness of breath (when asthma is active).       Marland Kitchen albuterol (PROVENTIL,VENTOLIN) 90 MCG/ACT inhaler Inhale 2 puffs into the lungs every 6 (six) hours as needed for shortness of breath.       . ALPRAZolam (XANAX) 0.5 MG tablet Take 0.5 mg by mouth 2 (two) times daily as needed for anxiety. For anxiety - Patient takes 1 in the morning and 1 in the evening daily. If needed she may take one in the afternoon.      Marland Kitchen amLODipine (NORVASC) 10 MG tablet Take 1 tablet (10 mg total) by mouth daily.  30 tablet  6  . aspirin EC 81 MG tablet Take 81 mg by mouth daily.      . B Complex-C (B-COMPLEX WITH VITAMIN C) tablet Take 1 tablet by mouth daily.      . calcium citrate (CALCITRATE - DOSED IN MG ELEMENTAL CALCIUM) 950 MG tablet Take 200 mg of elemental calcium by mouth daily.      . Cholecalciferol (VITAMIN D) 2000 UNITS tablet Take 2,000 Units by mouth daily.      . cholestyramine (QUESTRAN) 4 GM/DOSE powder Take 2 g by mouth 2 (two) times daily with a meal. (mixed in juice or light kool-aid).      . cloNIDine (CATAPRES) 0.2 MG tablet Take 0.2 mg by mouth 2 (two) times daily.      . clopidogrel (PLAVIX) 75 MG tablet TAKE 2 TABS TONIGHT (150 TOTAL), AND THEN 75 MG THEREAFTER  90 tablet  3  . ezetimibe (ZETIA) 10 MG tablet Take 1 tablet (10 mg total) by mouth daily.  30 tablet  11  . Fluticasone-Salmeterol (ADVAIR DISKUS) 250-50 MCG/DOSE AEPB Inhale 1 puff into the lungs every 12 (twelve) hours.       . furosemide (LASIX) 40 MG tablet Take 40 mg by mouth every morning.      . furosemide (LASIX) 40 MG tablet Take 20-40 mg by mouth every evening. Take 20mg   every evening but may take a whole tablet of 40mg  in the evening depending on swelling.      . gabapentin (NEURONTIN) 600 MG tablet Take 600 mg by mouth at bedtime.       Marland Kitchen HUMALOG KWIKPEN 100 UNIT/ML SOPN Inject 5 Units into the skin 2 (two) times daily with a meal.       . hydrALAZINE (APRESOLINE) 50 MG tablet Take 50 mg by mouth 3 (three) times daily as needed (for high blood pressure). If BP is >175      . HYDROcodone-acetaminophen (NORCO/VICODIN) 5-325 MG per tablet Take 1 tablet by mouth every evening.       . insulin  glargine (LANTUS) 100 UNIT/ML injection Inject 48 Units into the skin every morning.       . insulin glargine (LANTUS) 100 UNIT/ML injection Inject 4-16 Units into the skin at bedtime as needed (For blood sugar = or >150). Patient takes: 0 units if BG <150,  4 units if BG 150-174,  8 units if BG 175-199,  16 units if BG >200      . ipratropium-albuterol (DUONEB) 0.5-2.5 (3) MG/3ML SOLN Take 3 mLs by nebulization as needed (If severe asthma , use every 4 hours for 10 days).       Marland Kitchen loperamide (IMODIUM) 2 MG capsule Take 2 mg by mouth as needed for diarrhea or loose stools.      Marland Kitchen losartan (COZAAR) 100 MG tablet Take 100 mg by mouth 2 (two) times daily.       . Magnesium Oxide 250 MG TABS Take 250 mg by mouth 2 (two) times daily.       . metoprolol tartrate (LOPRESSOR) 25 MG tablet Take 12.5 mg by mouth 2 (two) times daily.       . Multiple Vitamin (MULTIVITAMIN) tablet Take 1 tablet by mouth daily.        . nitroGLYCERIN (NITROSTAT) 0.3 MG SL tablet Place 0.3 mg under the tongue every 5 (five) minutes x 3 doses as needed for chest pain.       Marland Kitchen omeprazole (PRILOSEC) 20 MG capsule Take 20 mg by mouth daily.       . ondansetron (ZOFRAN) 4 MG tablet Take 1 tablet (4 mg total) by mouth 2 (two) times daily as needed. For nausea  20 tablet  0  . ranolazine (RANEXA) 1000 MG SR tablet Take 1 tablet (1,000 mg total) by mouth 2 (two) times daily.  60 tablet  11  . rosuvastatin (CRESTOR)  40 MG tablet Take 40 mg by mouth daily.       Marland Kitchen topiramate (TOPAMAX) 50 MG tablet Take 1 tablet (50 mg total) by mouth 2 (two) times daily.  60 tablet  6  . triamterene-hydrochlorothiazide (DYAZIDE) 37.5-25 MG per capsule Take 1 capsule by mouth daily as needed (For extra fluid).      . vitamin B-12 (CYANOCOBALAMIN) 1000 MCG tablet Take 1,000 mcg by mouth daily.      Marland Kitchen zinc gluconate 50 MG tablet Take 50 mg by mouth daily.      Marland Kitchen zolpidem (AMBIEN) 10 MG tablet Take 10 mg by mouth at bedtime as needed for sleep.        No current facility-administered medications for this visit.    BP 124/64  Pulse 64  Ht 5\' 2"  (1.575 m)  Wt 267 lb (121.11 kg)  BMI 48.82 kg/m2  SpO2 97% General: NAD, obese Neck: No JVD, no thyromegaly or thyroid nodule.  Lungs: Occasional squeaks noted on lung exam.  CV: Nondisplaced PMI.  Heart regular S1/S2 with wide S2 split, no S3/S4, 1/6 early SEM.  1+ edema at ankles bilaterally.  No carotid bruit.  Normal pedal pulses.  Abdomen: Soft, nontender, no hepatosplenomegaly, no distention.  Skin: Intact without lesions or rashes.  Neurologic: Alert and oriented x 3.  Psych: Normal affect. Extremities: No clubbing or cyanosis.   Assessment/Plan: 1. CAD: Occluded native RCA and SVG-RCA.  Now status post opening of CTO RCA with 4 overlapping Xience DES.  Her main symptom currently is exertional dyspnea.  She does have some atypical chest pain.  She does not appear volume overloaded on exam and weight  is down.  Her dyspnea may be due to Brilinta (she was switched off Effient because of her history of stroke).  - Continue ASA 81, statin, losartan, Crestor, amlodipine, metoprolol, and ranolazine.  - I am going to stop Brilinta in case this is causing her dyspnea.  She cannot take Effient due to history of CVA.  I am going to put her on Plavix again. She is on Prozac, which can potentially interact with Plavix.  She does not feel that Prozac has really helped any since she  started it.  Therefore, I will have her stop the Prozac.  If she needs an SSRI, would aim for Lexapro, which does not interact with Plavix.  2. Chronic diastolic CHF: Though she is more short of breath, she does not look significantly volume overloaded.  Will come off Brilinta as above to see if this helps.  She will continue the same Lasix dose.  Check BMET/BNP today.  Weight is down.  3. Hyperlipidemia: Lipids better with addition of Zetia but LDL and TGs still higher than goal.  Will refer to lipid clinic.  4. Hypertension: BP is improved.  Continue current regimen.  5. Cerebrovascular disease: She has history of CVA and had left carotid stent in 2/15. Followed by [facility].    Loralie Champagne 07/26/2013

## 2013-07-26 NOTE — Patient Instructions (Addendum)
Your physician has recommended you make the following change in your medication:   STOP TAKING BRILINTA NOW   STOP TAKING PROZAC NOW  START TAKING PLAVIX- TAKE 2 TABS TONIGHT (150MG ) AND THEN STARTING TOMORROW 6/26 START TAKING 1 TAB (75 MG) DAILY.   Your physician recommends that you return for lab work in: TODAY (BMET, BNP)  Your physician recommends that you come to your follow-up appointment with Dr [name] on 08/14/13  Nurse Lelon Frohlich will call you next week to ask how you're feeling. Please answer your phone.

## 2013-07-27 ENCOUNTER — Encounter: Payer: Self-pay | Admitting: Cardiology

## 2013-07-27 ENCOUNTER — Encounter (HOSPITAL_COMMUNITY): Payer: Medicare Other

## 2013-07-30 ENCOUNTER — Encounter (HOSPITAL_COMMUNITY): Payer: Medicare Other

## 2013-08-01 ENCOUNTER — Encounter (HOSPITAL_COMMUNITY): Payer: Medicare Other

## 2013-08-02 ENCOUNTER — Telehealth: Payer: Self-pay | Admitting: *Deleted

## 2013-08-02 ENCOUNTER — Telehealth (HOSPITAL_COMMUNITY): Payer: Self-pay | Admitting: *Deleted

## 2013-08-02 DIAGNOSIS — E785 Hyperlipidemia, unspecified: Secondary | ICD-10-CM

## 2013-08-02 DIAGNOSIS — I2510 Atherosclerotic heart disease of native coronary artery without angina pectoris: Secondary | ICD-10-CM

## 2013-08-02 MED ORDER — EZETIMIBE 10 MG PO TABS: 10.0000 mg | ORAL_TABLET | Freq: Every day | ORAL | Status: DC

## 2013-08-02 NOTE — Telephone Encounter (Signed)
08/02/13 Copied from Dr Claris Gladden 07/26/13 note:  I am going to stop Brilinta in case this is causing her dyspnea. She cannot take Effient due to history of CVA. I am going to put her on Plavix again. She is on Prozac, which can potentially interact with Plavix. She does not feel that Prozac has really helped any since she started it. Therefore, I will have her stop the Prozac. If she needs an SSRI, would aim for Lexapro, which does not interact with Plavix.    Chronic diastolic CHF: Though she is more short of breath, she does not look significantly volume overloaded. Will come off Brilinta as above to see if this helps. She will continue the same Lasix dose. Check BMET/BNP today. Weight is down.   08/02/13 LMTCB at both numbers listed to see if pt's SOB has improved.

## 2013-08-02 NOTE — Telephone Encounter (Signed)
Fax has been sent to [facility].

## 2013-08-02 NOTE — Telephone Encounter (Signed)
Per Dr Yancey Flemings new recommendations at present, except that pt may return to [facility]. Dr [name] has signed a note stating pt may return to [facility] 08/06/13 that I will give to HIM to fax.

## 2013-08-02 NOTE — Telephone Encounter (Signed)
Pt does complain of more headaches /lightheadedness when she turns her head.  She has not checked her BP in the last week since medications were changed.  She is not at home right now to check her BP and heart rate.   I will forward to Dr [name] for review.

## 2013-08-02 NOTE — Telephone Encounter (Signed)
Pt called to say that her BP just now was 120/74, pulse 60.

## 2013-08-02 NOTE — Telephone Encounter (Signed)
Pt states SOB is about 5% better, she continues to have a significant amount of fatigue, weight is up 2.5 pounds since office visit 07/26/13 with Dr [name].

## 2013-08-03 ENCOUNTER — Encounter (HOSPITAL_COMMUNITY): Payer: Medicare Other

## 2013-08-06 ENCOUNTER — Encounter (HOSPITAL_COMMUNITY): Payer: Medicare Other

## 2013-08-06 ENCOUNTER — Encounter (HOSPITAL_COMMUNITY)
Admission: RE | Admit: 2013-08-06 | Discharge: 2013-08-06 | Disposition: A | Discharge status: Home / Self Care | Payer: Medicare Other | Source: Ambulatory Visit | Attending: Interventional Cardiology | Admitting: Interventional Cardiology

## 2013-08-06 DIAGNOSIS — I200 Unstable angina: Secondary | ICD-10-CM | POA: Insufficient documentation

## 2013-08-06 DIAGNOSIS — I252 Old myocardial infarction: Secondary | ICD-10-CM | POA: Diagnosis not present

## 2013-08-06 DIAGNOSIS — M6289 Other specified disorders of muscle: Secondary | ICD-10-CM | POA: Insufficient documentation

## 2013-08-06 DIAGNOSIS — I059 Rheumatic mitral valve disease, unspecified: Secondary | ICD-10-CM | POA: Diagnosis not present

## 2013-08-06 DIAGNOSIS — I5032 Chronic diastolic (congestive) heart failure: Secondary | ICD-10-CM | POA: Insufficient documentation

## 2013-08-06 DIAGNOSIS — Z5189 Encounter for other specified aftercare: Secondary | ICD-10-CM | POA: Insufficient documentation

## 2013-08-06 DIAGNOSIS — I6529 Occlusion and stenosis of unspecified carotid artery: Secondary | ICD-10-CM | POA: Insufficient documentation

## 2013-08-06 DIAGNOSIS — I2510 Atherosclerotic heart disease of native coronary artery without angina pectoris: Secondary | ICD-10-CM | POA: Insufficient documentation

## 2013-08-06 DIAGNOSIS — R0789 Other chest pain: Secondary | ICD-10-CM | POA: Diagnosis not present

## 2013-08-06 DIAGNOSIS — M629 Disorder of muscle, unspecified: Secondary | ICD-10-CM | POA: Insufficient documentation

## 2013-08-06 LAB — GLUCOSE, CAPILLARY: Glucose-Capillary: 173 mg/dL — ABNORMAL HIGH (ref 70–99)

## 2013-08-06 NOTE — Progress Notes (Signed)
[name] [name] returned to exercise today and exercised without complaints or chest pain. I had  [name] take several rest breaks today during exercise oxygen saturations 95-97% on room air. Will continue to monitor the patient throughout  the program.

## 2013-08-08 ENCOUNTER — Encounter (HOSPITAL_COMMUNITY)
Admission: RE | Admit: 2013-08-08 | Discharge: 2013-08-08 | Disposition: A | Discharge status: Home / Self Care | Payer: Medicare Other | Source: Ambulatory Visit | Attending: Interventional Cardiology | Admitting: Interventional Cardiology

## 2013-08-08 ENCOUNTER — Encounter (HOSPITAL_COMMUNITY): Payer: Medicare Other

## 2013-08-08 DIAGNOSIS — Z5189 Encounter for other specified aftercare: Secondary | ICD-10-CM | POA: Diagnosis not present

## 2013-08-08 LAB — GLUCOSE, CAPILLARY: Glucose-Capillary: 169 mg/dL — ABNORMAL HIGH (ref 70–99)

## 2013-08-10 ENCOUNTER — Encounter (HOSPITAL_COMMUNITY): Payer: Medicare Other

## 2013-08-13 ENCOUNTER — Encounter (HOSPITAL_COMMUNITY): Payer: Medicare Other

## 2013-08-14 ENCOUNTER — Encounter: Payer: Self-pay | Admitting: Cardiology

## 2013-08-14 ENCOUNTER — Ambulatory Visit (INDEPENDENT_AMBULATORY_CARE_PROVIDER_SITE_OTHER): Payer: Medicare Other | Admitting: Cardiology

## 2013-08-14 VITALS — BP 124/68 | HR 60 | Ht 62.0 in | Wt 269.0 lb

## 2013-08-14 DIAGNOSIS — I5032 Chronic diastolic (congestive) heart failure: Secondary | ICD-10-CM

## 2013-08-14 DIAGNOSIS — I209 Angina pectoris, unspecified: Secondary | ICD-10-CM

## 2013-08-14 DIAGNOSIS — I509 Heart failure, unspecified: Secondary | ICD-10-CM

## 2013-08-14 DIAGNOSIS — E785 Hyperlipidemia, unspecified: Secondary | ICD-10-CM

## 2013-08-14 DIAGNOSIS — I2510 Atherosclerotic heart disease of native coronary artery without angina pectoris: Secondary | ICD-10-CM

## 2013-08-14 DIAGNOSIS — I10 Essential (primary) hypertension: Secondary | ICD-10-CM

## 2013-08-14 NOTE — Patient Instructions (Signed)
You have been referred to [facility] Clinic.  Your physician recommends that you schedule a follow-up appointment in: 3 months with Dr [name].

## 2013-08-15 ENCOUNTER — Encounter (HOSPITAL_COMMUNITY)
Admission: RE | Admit: 2013-08-15 | Discharge: 2013-08-15 | Disposition: A | Discharge status: Home / Self Care | Payer: Medicare Other | Source: Ambulatory Visit | Attending: Interventional Cardiology | Admitting: Interventional Cardiology

## 2013-08-15 ENCOUNTER — Encounter (HOSPITAL_COMMUNITY): Payer: Medicare Other

## 2013-08-15 DIAGNOSIS — Z5189 Encounter for other specified aftercare: Secondary | ICD-10-CM | POA: Diagnosis not present

## 2013-08-15 LAB — GLUCOSE, CAPILLARY: Glucose-Capillary: 162 mg/dL — ABNORMAL HIGH (ref 70–99)

## 2013-08-15 NOTE — Progress Notes (Signed)
Patient ID: [name] [name], female   DOB: 1945-04-10, 68 y.o.   [name]: [id number] PCP: Dr. Sandi [name]  68 yo with history of CAD s/p CABG, diastolic CHF, and cerebrovascular disease with history of CVA presents for cardiology followup. She had CABG x 5 in 12/11.  Prior to the CABG she had multiple PCIs.  She had a CVA in 2/14 that presented as visual loss.  She ended up getting a cerebral angiogram in 6/14 with 70% ostial right vertebral stenosis, > 70% stenosis proximal left posterior cerebral artery, and 123456 LICA stenosis.  She has been on ASA and Plavix.  She recently saw [name] [name], and carotid dopplers suggested worsening of LICA stenosis to > 123XX123.  She has had episodes of transient expressive aphasia for several months.  She is planned for a carotid stent soon.  Given dyspnea and chest pain that was worsened from her baseline, I took [name] [name] for left heart catheterization in 9/14.  This showed patent SVG-D, LIMA-LAD, and sequential SVG-OM branches but SVG-RCA and the native RCA were both totally occluded.  I suspect that her increased symptoms coincided with occlusion of SVG-RCA.  I started her on Imdur to see if this would help with the chest pain and dyspnea.  However, she feels like Imdur causes leg cramps and does not think that she can take it.  I next started her on ranolazine 500 mg bid and titrated this up to 1000 mg bid.  This helped some but not markedly.  Therefore, I had her see [name] [name] to address opening RCA CTO. He was able to do this in 5/15 with 4 overlapping Xience DES.    [name] [name] was initially on Effient s/p PCI, but this was changed to Brilinta given stroke history.  She became much more short of breath after starting on Brilinta.  I had her stop Brilinta at last appointment and replaced it with Plavix.  Given risk for interaction with Plavix, Prozac was stopped and her PCP began her on Effexor.  Since coming off Brilinta, she has done much better.  She can walk around her  house without dyspnea.  She is short of breath walking across a parking lot or up 1/2 flight of steps.  No orthopnea.  Since PCI, she is having less chest pain.  Now she has 1-2 episodes of nonexertional mild chest pain weekly.  ECG: NSR, RAE, LBBB  Labs (6/14): K 3.8, creatinine 0.71 Labs (8/14): BNP 249, LDL 166 Labs (9/14): K 4.7, creatinine 0.7 Labs (9/14): K 4.6, creatinine 0.9, BNP 109 Labs (1/15): K 4.5, creatinine 0.73, LDL 212, HCT 43.4, TSH normal, BNP 138 Labs (6/15): K 4.7, creatinine 1.17, LDL 100, HDL 37, TGs 363, BNP 39  PMH: 1. Diabetic gastroparesis 2. Type II diabetes 3. HTN 4. Morbid obesity 5. CAD: s/p CABG in 12/11 after prior PCIs.  LIMA-LAD, SVG-D, seq SVG-OM1 and OM2, SVG-PDA. Adenosine Cardiolite (8/14) with EF 53% and a small reversible apical defect with a medium, partially reversible inferior defect.  LHC (9/14) with patent SVG-D, LIMA-LAD (50% distal LAD), and sequential SVG-OM branches; the SVG-RCA and the native RCA were occluded.  This was managed medically initially, but with ongoing exertional chest pain, it was decided to open CTO.  Patient had CTO opening with 4 overlapping Xience DES in the RCA in 5/15.  .  6. Atypical migraines 7. OSA: Intolerant of CPAP.  8. GERD with hiatal hernia 9. OA 10. Diastolic CHF: Echo (A999333) with EF 50-55%,  grade II diastolic dysfunction, mild-moderate MR.  Echo (8/14) with EF 55-60%, grade II diastolic dysfunction, mildly increased aortic valve gradient (mean 12 mmHg) but valve opens well, mild MR and mild RV dilation.  11. CKD 12. Chronic LBBB 13. Anxiety 14. Carotid stenosis: Followed by [facility], XX123456 LICA stenosis XX123456.  She had left carotid stent in 2/15.  15. Cerebrovascular disease: CVA 2/14 with right posterior cerebral artery territory ischemic infarction. Cerebral angiogram in 6/14 showed 70% right vertebral ostial stenosis, 123456 LICA stenosis, > XX123456 proximal left posterior cerebral artery stenosis, posterior  communicating artery aneurysm.  Patient has had episodes of transient expressive aphasia.  Carotid dopplers (XX123456) showed XX123456 LICA stenosis.  16. Positional vertigo (suspected) 17. Palpitations: Holter (6/15) with rare PVCs/PACs.  18. Dyspnea with Brilinta.   SH: Married, nonsmoker  FH: CAD  ROS: All systems reviewed and negative except as per HPI.   Current Outpatient Prescriptions  Medication Sig Dispense Refill  . albuterol (PROVENTIL) (2.5 MG/3ML) 0.083% nebulizer solution Take 2.5 mg by nebulization every 6 (six) hours as needed for shortness of breath (when asthma is active).       Marland Kitchen albuterol (PROVENTIL,VENTOLIN) 90 MCG/ACT inhaler Inhale 2 puffs into the lungs every 6 (six) hours as needed for shortness of breath.       . ALPRAZolam (XANAX) 0.5 MG tablet Take 0.5 mg by mouth 2 (two) times daily as needed for anxiety. For anxiety - Patient takes 1 in the morning and 1 in the evening daily. If needed she may take one in the afternoon.      Marland Kitchen amLODipine (NORVASC) 10 MG tablet Take 1 tablet (10 mg total) by mouth daily.  30 tablet  6  . aspirin EC 81 MG tablet Take 81 mg by mouth daily.      . B Complex-C (B-COMPLEX WITH VITAMIN C) tablet Take 1 tablet by mouth daily.      . calcium citrate (CALCITRATE - DOSED IN MG ELEMENTAL CALCIUM) 950 MG tablet Take 200 mg of elemental calcium by mouth daily.      . Cholecalciferol (VITAMIN D) 2000 UNITS tablet Take 2,000 Units by mouth daily.      . cholestyramine (QUESTRAN) 4 GM/DOSE powder Take 2 g by mouth 2 (two) times daily with a meal. (mixed in juice or light kool-aid).      . cloNIDine (CATAPRES) 0.2 MG tablet Take 0.2 mg by mouth 2 (two) times daily.      . clopidogrel (PLAVIX) 75 MG tablet TAKE 2 TABS TONIGHT (150 TOTAL), AND THEN 75 MG THEREAFTER  90 tablet  3  . ezetimibe (ZETIA) 10 MG tablet Take 1 tablet (10 mg total) by mouth daily.  30 tablet  11  . Fluticasone-Salmeterol (ADVAIR DISKUS) 250-50 MCG/DOSE AEPB Inhale 1 puff into the  lungs every 12 (twelve) hours.       . furosemide (LASIX) 40 MG tablet Take 40 mg by mouth every morning.      . furosemide (LASIX) 40 MG tablet Take 20-40 mg by mouth every evening. Take 20mg  every evening but may take a whole tablet of 40mg  in the evening depending on swelling.      . gabapentin (NEURONTIN) 600 MG tablet Take 600 mg by mouth at bedtime.       Marland Kitchen HUMALOG KWIKPEN 100 UNIT/ML SOPN Inject 5 Units into the skin 2 (two) times daily with a meal.       . hydrALAZINE (APRESOLINE) 50 MG tablet Take 50 mg by  mouth 3 (three) times daily as needed (for high blood pressure). If BP is >175      . HYDROcodone-acetaminophen (NORCO/VICODIN) 5-325 MG per tablet Take 1 tablet by mouth every evening.       . insulin glargine (LANTUS) 100 UNIT/ML injection Inject 48 Units into the skin every morning.       . insulin glargine (LANTUS) 100 UNIT/ML injection Inject 4-16 Units into the skin at bedtime as needed (For blood sugar = or >150). Patient takes: 0 units if BG <150,  4 units if BG 150-174,  8 units if BG 175-199,  16 units if BG >200      . ipratropium-albuterol (DUONEB) 0.5-2.5 (3) MG/3ML SOLN Take 3 mLs by nebulization as needed (If severe asthma , use every 4 hours for 10 days).       Marland Kitchen loperamide (IMODIUM) 2 MG capsule Take 2 mg by mouth as needed for diarrhea or loose stools.      Marland Kitchen losartan (COZAAR) 100 MG tablet Take 100 mg by mouth 2 (two) times daily.       . Magnesium Oxide 250 MG TABS Take 250 mg by mouth 2 (two) times daily.       . metoprolol tartrate (LOPRESSOR) 25 MG tablet Take 12.5 mg by mouth 2 (two) times daily.       . Multiple Vitamin (MULTIVITAMIN) tablet Take 1 tablet by mouth daily.        . nitroGLYCERIN (NITROSTAT) 0.3 MG SL tablet Place 0.3 mg under the tongue every 5 (five) minutes x 3 doses as needed for chest pain.       Marland Kitchen omeprazole (PRILOSEC) 20 MG capsule Take 20 mg by mouth daily.       . ondansetron (ZOFRAN) 4 MG tablet Take 1 tablet (4 mg total) by mouth 2  (two) times daily as needed. For nausea  20 tablet  0  . ranolazine (RANEXA) 1000 MG SR tablet Take 1 tablet (1,000 mg total) by mouth 2 (two) times daily.  60 tablet  11  . rosuvastatin (CRESTOR) 40 MG tablet Take 40 mg by mouth daily.       Marland Kitchen topiramate (TOPAMAX) 50 MG tablet Take 1 tablet (50 mg total) by mouth 2 (two) times daily.  60 tablet  6  . triamterene-hydrochlorothiazide (DYAZIDE) 37.5-25 MG per capsule Take 1 capsule by mouth daily as needed (For extra fluid).      . venlafaxine (EFFEXOR) 37.5 MG tablet Take 37.5 mg by mouth as needed.      . vitamin B-12 (CYANOCOBALAMIN) 1000 MCG tablet Take 1,000 mcg by mouth daily.      Marland Kitchen zinc gluconate 50 MG tablet Take 50 mg by mouth daily.      Marland Kitchen zolpidem (AMBIEN) 10 MG tablet Take 10 mg by mouth at bedtime as needed for sleep.        No current facility-administered medications for this visit.    BP 124/68  Pulse 60  Ht 5\' 2"  (1.575 m)  Wt 269 lb (122.018 kg)  BMI 49.19 kg/m2  SpO2 99% General: NAD, obese Neck: No JVD, no thyromegaly or thyroid nodule.  Lungs: Occasional squeaks noted on lung exam.  CV: Nondisplaced PMI.  Heart regular S1/S2 with wide S2 split, no S3/S4, 1/6 early SEM.  1+ edema at ankles bilaterally.  No carotid bruit.  Normal pedal pulses.  Abdomen: Soft, nontender, no hepatosplenomegaly, no distention.  Skin: Intact without lesions or rashes.  Neurologic: Alert and oriented x  3.  Psych: Normal affect. Extremities: No clubbing or cyanosis.   Assessment/Plan: 1. CAD: Occluded native RCA and SVG-RCA.  Now status post opening of CTO RCA with 4 overlapping Xience DES.  Occasional atypical chest pain now, improved. Dyspnea much improved with change from Brilinta to Plavix.  - Continue ASA 81, statin, losartan, Crestor, amlodipine, metoprolol, and ranolazine.  - She will continue Plavix long-term.  Prozac was stopped and she was put on Effexor to avoid interaction with Plavix.  2. Chronic diastolic CHF: She is not  particularly volume overloaded on exam.  I will have her continue the current Lasix dose.  Suspect recent dyspnea worsening was due to Brilinta.  Now resolved.  3. Hyperlipidemia: Lipids better with addition of Zetia but LDL and TGs still higher than goal.  Will refer to lipid clinic.  4. Hypertension: BP is improved.  Continue current regimen.  5. Cerebrovascular disease: She has history of CVA and had left carotid stent in 2/15. Followed by [facility].    Loralie Champagne 08/15/2013

## 2013-08-16 ENCOUNTER — Ambulatory Visit: Payer: Medicare Other | Admitting: Pharmacist

## 2013-08-17 ENCOUNTER — Encounter (HOSPITAL_COMMUNITY)
Admission: RE | Admit: 2013-08-17 | Discharge: 2013-08-17 | Disposition: A | Discharge status: Home / Self Care | Payer: Medicare Other | Source: Ambulatory Visit | Attending: Interventional Cardiology | Admitting: Interventional Cardiology

## 2013-08-17 ENCOUNTER — Encounter (HOSPITAL_COMMUNITY): Payer: Medicare Other

## 2013-08-17 DIAGNOSIS — Z5189 Encounter for other specified aftercare: Secondary | ICD-10-CM | POA: Diagnosis not present

## 2013-08-17 LAB — GLUCOSE, CAPILLARY: Glucose-Capillary: 149 mg/dL — ABNORMAL HIGH (ref 70–99)

## 2013-08-20 ENCOUNTER — Encounter (HOSPITAL_COMMUNITY): Payer: Medicare Other

## 2013-08-20 ENCOUNTER — Encounter (HOSPITAL_COMMUNITY)
Admission: RE | Admit: 2013-08-20 | Discharge: 2013-08-20 | Disposition: A | Discharge status: Home / Self Care | Payer: Medicare Other | Source: Ambulatory Visit | Attending: Interventional Cardiology | Admitting: Interventional Cardiology

## 2013-08-20 DIAGNOSIS — Z5189 Encounter for other specified aftercare: Secondary | ICD-10-CM | POA: Diagnosis not present

## 2013-08-20 LAB — GLUCOSE, CAPILLARY: Glucose-Capillary: 159 mg/dL — ABNORMAL HIGH (ref 70–99)

## 2013-08-20 NOTE — Progress Notes (Signed)
Nutrition Note Spoke with pt. Pt reports she would like to see this writer to help her combine "all my diets (heart healthy, low carb, low potassium)." Appointment scheduled for Monday 08/27/13. Continue client-centered nutrition education by RD as part of interdisciplinary care.  Monitor and evaluate progress toward nutrition goal with team.  Derek Mound, M.Ed, RD, LDN, CDE 08/20/2013 11:26 AM

## 2013-08-22 ENCOUNTER — Encounter (HOSPITAL_COMMUNITY)
Admission: RE | Admit: 2013-08-22 | Discharge: 2013-08-22 | Disposition: A | Discharge status: Home / Self Care | Payer: Medicare Other | Source: Ambulatory Visit | Attending: Interventional Cardiology | Admitting: Interventional Cardiology

## 2013-08-22 ENCOUNTER — Encounter (HOSPITAL_COMMUNITY): Payer: Medicare Other

## 2013-08-22 DIAGNOSIS — Z5189 Encounter for other specified aftercare: Secondary | ICD-10-CM | POA: Diagnosis not present

## 2013-08-22 LAB — GLUCOSE, CAPILLARY: Glucose-Capillary: 172 mg/dL — ABNORMAL HIGH (ref 70–99)

## 2013-08-24 ENCOUNTER — Encounter (HOSPITAL_COMMUNITY)
Admission: RE | Admit: 2013-08-24 | Discharge: 2013-08-24 | Disposition: A | Discharge status: Home / Self Care | Payer: Medicare Other | Source: Ambulatory Visit | Attending: Interventional Cardiology | Admitting: Interventional Cardiology

## 2013-08-24 ENCOUNTER — Encounter (HOSPITAL_COMMUNITY): Payer: Medicare Other

## 2013-08-24 DIAGNOSIS — Z5189 Encounter for other specified aftercare: Secondary | ICD-10-CM | POA: Diagnosis not present

## 2013-08-24 LAB — GLUCOSE, CAPILLARY: Glucose-Capillary: 130 mg/dL — ABNORMAL HIGH (ref 70–99)

## 2013-08-27 ENCOUNTER — Encounter (HOSPITAL_COMMUNITY)
Admission: RE | Admit: 2013-08-27 | Discharge: 2013-08-27 | Disposition: A | Discharge status: Home / Self Care | Payer: Medicare Other | Source: Ambulatory Visit | Attending: Interventional Cardiology | Admitting: Interventional Cardiology

## 2013-08-27 ENCOUNTER — Encounter (HOSPITAL_COMMUNITY): Payer: Medicare Other

## 2013-08-27 DIAGNOSIS — Z5189 Encounter for other specified aftercare: Secondary | ICD-10-CM | POA: Diagnosis not present

## 2013-08-27 LAB — GLUCOSE, CAPILLARY: Glucose-Capillary: 172 mg/dL — ABNORMAL HIGH (ref 70–99)

## 2013-08-27 NOTE — Progress Notes (Signed)
Nutrition Note Spoke with pt re: Heart Healthy, Diabetic, low potassium diet. Pt states the PA at her MD's office recommended an RD consult due to elevated triglycerides. Pt follows a Diabetic, TLC Step 2 diet. Per discussion, pt is not watching the potassium content in her diet as strictly per MD request. Pt states her CBG's have been elevated over the past 3 months and "they are finally starting to come down after 3 months." Pt has been through "6 hospitalizations since February." Elevated CBG's likely due to wound healing given pt had a significant decrease in appetite post-op due to depression. Pt states, "my husband calls to make sure I eat something so my blood sugar doesn't bottom out." Elevated TG's may be due, in part, to increased CBG's and possibly due to genetics given pt reports a family h/o of elevated TGs. Pt denies recent episodes of hypoglycemia. Feel pt is following the appropriate dietary recommendations to help control TG's.   Lab Results  Component Value Date   CHOL 210* 07/19/2013   CHOL 197 05/09/2013   CHOL 224* 09/21/2012   Lab Results  Component Value Date   HDL 37* 07/19/2013   HDL 42.40 05/09/2013   HDL 39.60 09/21/2012   Lab Results  Component Value Date   LDLCALC 100* 07/19/2013   LDLCALC 127* 05/09/2013   LDLCALC  Value: 173        Total Cholesterol/HDL:CHD Risk Coronary Heart Disease Risk Table                     Men   Women  1/2 Average Risk   3.4   3.3* 01/17/2008   Lab Results  Component Value Date   TRIG 363* 07/19/2013   TRIG 140.0 05/09/2013   TRIG 218.0* 09/21/2012   Lab Results  Component Value Date   CHOLHDL 5.7 07/19/2013   CHOLHDL 5 05/09/2013   CHOLHDL 6 09/21/2012   Lab Results  Component Value Date   LDLDIRECT 166.6 09/21/2012    24 hour food recall  Bran Flakes with fat free milk or Toast with peanut butter Water and/or coffee with sugar substitute  Salad with light raspberry vinegarette Veggies Small plain baked potato Water  Baked,  grilled, or broiled chicken or fish Rice Green beans Water  Nutrition Diagnosis  Food-and nutrition-related knowledge deficit related to lack of exposure to information as related to diagnosis of: ? CVD ? DM  Obesity related to excessive energy intake as evidenced by a BMI of 48.3 Nutrition RX/ Estimated Daily Nutrition Needs for: wt loss  1450-1950 Kcal, 40-50 gm fat, 9-15 gm sat fat, 1.4-2.0 gm trans-fat, <1500 mg sodium, 175-250 gm CHO  Nutrition Intervention  Pt's individual nutrition plan reviewed with pt.  Handouts given for Nutrition Therapy for High TG's and 1500 kcal, 5 day menu ideas Continue client-centered nutrition education by RD, as part of interdisciplinary care. Goal(s)  Pt to identify food quantities necessary to achieve: ? wt loss to a goal wt of 248-266 lb (112.9-121.1 kg) at graduation from cardiac rehab.  CBG concentrations in the normal range or as close to normal as is safely possible. Monitor and Evaluate progress toward nutrition goal with team.  Nutrition Risk: Moderate  Derek Mound, M.Ed, RD, LDN, CDE 08/27/2013 12:26 PM

## 2013-08-29 ENCOUNTER — Encounter (HOSPITAL_COMMUNITY): Payer: Medicare Other

## 2013-08-29 ENCOUNTER — Encounter (HOSPITAL_COMMUNITY)
Admission: RE | Admit: 2013-08-29 | Discharge: 2013-08-29 | Disposition: A | Discharge status: Home / Self Care | Payer: Medicare Other | Source: Ambulatory Visit | Attending: Interventional Cardiology | Admitting: Interventional Cardiology

## 2013-08-29 DIAGNOSIS — Z5189 Encounter for other specified aftercare: Secondary | ICD-10-CM | POA: Diagnosis not present

## 2013-08-29 LAB — GLUCOSE, CAPILLARY: Glucose-Capillary: 170 mg/dL — ABNORMAL HIGH (ref 70–99)

## 2013-08-31 ENCOUNTER — Encounter: Payer: Self-pay | Admitting: Internal Medicine

## 2013-08-31 ENCOUNTER — Encounter (HOSPITAL_COMMUNITY): Payer: Medicare Other

## 2013-08-31 ENCOUNTER — Telehealth (HOSPITAL_COMMUNITY): Payer: Self-pay | Admitting: Family Medicine

## 2013-09-03 ENCOUNTER — Encounter (HOSPITAL_COMMUNITY): Payer: Medicare Other

## 2013-09-03 ENCOUNTER — Encounter (HOSPITAL_COMMUNITY)
Admission: RE | Admit: 2013-09-03 | Discharge: 2013-09-03 | Disposition: A | Discharge status: Home / Self Care | Payer: Medicare Other | Source: Ambulatory Visit | Attending: Interventional Cardiology | Admitting: Interventional Cardiology

## 2013-09-03 DIAGNOSIS — I2510 Atherosclerotic heart disease of native coronary artery without angina pectoris: Secondary | ICD-10-CM | POA: Diagnosis present

## 2013-09-03 DIAGNOSIS — I5032 Chronic diastolic (congestive) heart failure: Secondary | ICD-10-CM | POA: Insufficient documentation

## 2013-09-03 DIAGNOSIS — R0789 Other chest pain: Secondary | ICD-10-CM | POA: Insufficient documentation

## 2013-09-03 DIAGNOSIS — M6289 Other specified disorders of muscle: Secondary | ICD-10-CM | POA: Diagnosis not present

## 2013-09-03 DIAGNOSIS — M629 Disorder of muscle, unspecified: Secondary | ICD-10-CM | POA: Diagnosis not present

## 2013-09-03 DIAGNOSIS — I059 Rheumatic mitral valve disease, unspecified: Secondary | ICD-10-CM | POA: Insufficient documentation

## 2013-09-03 DIAGNOSIS — I200 Unstable angina: Secondary | ICD-10-CM | POA: Insufficient documentation

## 2013-09-03 DIAGNOSIS — I6529 Occlusion and stenosis of unspecified carotid artery: Secondary | ICD-10-CM | POA: Insufficient documentation

## 2013-09-03 DIAGNOSIS — I252 Old myocardial infarction: Secondary | ICD-10-CM | POA: Diagnosis not present

## 2013-09-03 DIAGNOSIS — Z5189 Encounter for other specified aftercare: Secondary | ICD-10-CM | POA: Insufficient documentation

## 2013-09-03 NOTE — Progress Notes (Signed)
Pt returned to exercise today after complaints of thigh cramps through the night on Thursday.  Pt had some additional cramping over the weekend but not as severe.  Pt repositioned on the recumbent bike which pt believes may be the source of the cramping.  Pt has an appt on tomorrow with her primary MD -Dr. Clair Gulling.  Asked pt to talk to Md about the cramping and whether lab work will be needed. Pt is on diuretic therapy and at one point she was on KCL replacement that was stopped due to high K+ level. Pt has not had a recheck since that point.  Pt given copy of rehab report to take with her for MD review.  Will follow up on Wednesday.  Cherre Huger, BSN

## 2013-09-04 LAB — GLUCOSE, CAPILLARY: Glucose-Capillary: 160 mg/dL — ABNORMAL HIGH (ref 70–99)

## 2013-09-05 ENCOUNTER — Encounter (HOSPITAL_COMMUNITY): Payer: Medicare Other

## 2013-09-05 ENCOUNTER — Encounter (HOSPITAL_COMMUNITY)
Admission: RE | Admit: 2013-09-05 | Discharge: 2013-09-05 | Disposition: A | Discharge status: Home / Self Care | Payer: Medicare Other | Source: Ambulatory Visit | Attending: Interventional Cardiology | Admitting: Interventional Cardiology

## 2013-09-05 DIAGNOSIS — Z5189 Encounter for other specified aftercare: Secondary | ICD-10-CM | POA: Diagnosis not present

## 2013-09-05 LAB — GLUCOSE, CAPILLARY: Glucose-Capillary: 186 mg/dL — ABNORMAL HIGH (ref 70–99)

## 2013-09-06 ENCOUNTER — Encounter: Payer: Self-pay | Admitting: Internal Medicine

## 2013-09-07 ENCOUNTER — Encounter (HOSPITAL_COMMUNITY)
Admission: RE | Admit: 2013-09-07 | Discharge: 2013-09-07 | Disposition: A | Discharge status: Home / Self Care | Payer: Medicare Other | Source: Ambulatory Visit | Attending: Interventional Cardiology | Admitting: Interventional Cardiology

## 2013-09-07 ENCOUNTER — Encounter (HOSPITAL_COMMUNITY): Payer: Medicare Other

## 2013-09-07 DIAGNOSIS — Z5189 Encounter for other specified aftercare: Secondary | ICD-10-CM | POA: Diagnosis not present

## 2013-09-10 ENCOUNTER — Encounter (HOSPITAL_COMMUNITY)
Admission: RE | Admit: 2013-09-10 | Discharge: 2013-09-10 | Disposition: A | Discharge status: Home / Self Care | Payer: Medicare Other | Source: Ambulatory Visit | Attending: Interventional Cardiology | Admitting: Interventional Cardiology

## 2013-09-10 ENCOUNTER — Encounter (HOSPITAL_COMMUNITY): Payer: Medicare Other

## 2013-09-10 DIAGNOSIS — Z5189 Encounter for other specified aftercare: Secondary | ICD-10-CM | POA: Diagnosis not present

## 2013-09-10 LAB — GLUCOSE, CAPILLARY: Glucose-Capillary: 188 mg/dL — ABNORMAL HIGH (ref 70–99)

## 2013-09-10 NOTE — Progress Notes (Addendum)
Xianna reported feeling a little light headed post exercise today. Sitting blood pressure 114/60. Standing blood pressure 122/70. Raelle was recently started on a new antidepressant. Orea told me she ate a breakfast bar this morning.  Patient given a half of a banana and drank some water. [name] had no further complaints after she ate a banana.  Recheck blood pressure 122/70 standing. Will continue to monitor the patient throughout  the program. Post exercise CBG 181. Exit heart rate 56 Sinus Brady. Dr Estill Bamberg office called called and notified of the patient's symptoms today. Will fax exercise flow sheets to Dr. Deboraha Sprang office for review.

## 2013-09-12 ENCOUNTER — Encounter (HOSPITAL_COMMUNITY): Payer: Medicare Other

## 2013-09-12 ENCOUNTER — Encounter (HOSPITAL_COMMUNITY)
Admission: RE | Admit: 2013-09-12 | Discharge: 2013-09-12 | Disposition: A | Discharge status: Home / Self Care | Payer: Medicare Other | Source: Ambulatory Visit | Attending: Interventional Cardiology | Admitting: Interventional Cardiology

## 2013-09-12 DIAGNOSIS — Z5189 Encounter for other specified aftercare: Secondary | ICD-10-CM | POA: Diagnosis not present

## 2013-09-12 LAB — GLUCOSE, CAPILLARY: Glucose-Capillary: 160 mg/dL — ABNORMAL HIGH (ref 70–99)

## 2013-09-14 ENCOUNTER — Encounter (HOSPITAL_COMMUNITY): Payer: Medicare Other

## 2013-09-17 ENCOUNTER — Encounter (HOSPITAL_COMMUNITY): Payer: Medicare Other

## 2013-09-19 ENCOUNTER — Encounter (HOSPITAL_COMMUNITY): Payer: Medicare Other

## 2013-09-19 ENCOUNTER — Encounter (HOSPITAL_COMMUNITY)
Admission: RE | Admit: 2013-09-19 | Discharge: 2013-09-19 | Disposition: A | Discharge status: Home / Self Care | Payer: Medicare Other | Source: Ambulatory Visit | Attending: Interventional Cardiology | Admitting: Interventional Cardiology

## 2013-09-19 DIAGNOSIS — Z5189 Encounter for other specified aftercare: Secondary | ICD-10-CM | POA: Diagnosis not present

## 2013-09-19 LAB — GLUCOSE, CAPILLARY: Glucose-Capillary: 158 mg/dL — ABNORMAL HIGH (ref 70–99)

## 2013-09-19 NOTE — Progress Notes (Signed)
[name] [name] returned to exercise after being out of town for 5 days. [name] [name]'s weight is up 1.9 kg from 09/12/2013.  Positive bilateral lower extremity edema noted. Blood pressure 164/70 on the recumbent bike with a recheck blood pressure of 138/72. [name] [name] reports some increased shortness of breath. Upon assessment lung fields are clear A and P.  Oxygen saturation 96% on room air. [name] [name] travelled to [place] and [place] while out of town. [name] [name] spent a lot of time riding in the car.  Dr Claris Gladden office called and notified. Faxed today's exercise flow over for review. [name] [name] also complained of continued intermittent dizziness since her antidepressant was changed. I called Dr Deboraha Sprang office to let him know of the patient complaints. [name] [name] did not complain of feeling dizzy today at cardiac rehab. Will fax exercise flow sheets to Dr. Deboraha Sprang office for review.  Exit blood pressure 110/58. Will continue to monitor the patient throughout  the program.

## 2013-09-21 ENCOUNTER — Encounter (HOSPITAL_COMMUNITY): Payer: Medicare Other

## 2013-09-21 ENCOUNTER — Encounter (HOSPITAL_COMMUNITY)
Admission: RE | Admit: 2013-09-21 | Discharge: 2013-09-21 | Disposition: A | Discharge status: Home / Self Care | Payer: Medicare Other | Source: Ambulatory Visit | Attending: Interventional Cardiology | Admitting: Interventional Cardiology

## 2013-09-21 ENCOUNTER — Encounter: Payer: Self-pay | Admitting: Neurology

## 2013-09-21 ENCOUNTER — Telehealth: Payer: Self-pay | Admitting: Neurology

## 2013-09-21 ENCOUNTER — Ambulatory Visit (INDEPENDENT_AMBULATORY_CARE_PROVIDER_SITE_OTHER): Payer: Medicare Other | Admitting: Neurology

## 2013-09-21 VITALS — BP 134/66 | HR 59 | Wt 280.0 lb

## 2013-09-21 DIAGNOSIS — I209 Angina pectoris, unspecified: Secondary | ICD-10-CM

## 2013-09-21 DIAGNOSIS — R42 Dizziness and giddiness: Secondary | ICD-10-CM

## 2013-09-21 DIAGNOSIS — Z5189 Encounter for other specified aftercare: Secondary | ICD-10-CM | POA: Diagnosis not present

## 2013-09-21 DIAGNOSIS — G458 Other transient cerebral ischemic attacks and related syndromes: Secondary | ICD-10-CM

## 2013-09-21 LAB — GLUCOSE, CAPILLARY: Glucose-Capillary: 134 mg/dL — ABNORMAL HIGH (ref 70–99)

## 2013-09-21 NOTE — Progress Notes (Signed)
[name] [name] has an appointment to see Dr Jannifer Franklin today at 2:30pm.

## 2013-09-21 NOTE — Telephone Encounter (Signed)
Sherry from Dr. Dustin Folks office calling to make [place] aware that she just faxed over helpful notes regarding the patient.

## 2013-09-21 NOTE — Telephone Encounter (Signed)
[name] [name] from Dr. Dustin Folks office calling to state that patient needs sooner appointment than the one scheduled in December due to orthostatic dizziness, please return call to [facility] and advise.

## 2013-09-21 NOTE — Patient Instructions (Signed)
Dizziness Dizziness is a common problem. It is a feeling of unsteadiness or light-headedness. You may feel like you are about to faint. Dizziness can lead to injury if you stumble or fall. A person of any age group can suffer from dizziness, but dizziness is more common in older adults. CAUSES  Dizziness can be caused by many different things, including:  Middle ear problems.  Standing for too long.  Infections.  An allergic reaction.  Aging.  An emotional response to something, such as the sight of blood.  Side effects of medicines.  Tiredness.  Problems with circulation or blood pressure.  Excessive use of alcohol or medicines, or illegal drug use.  Breathing too fast (hyperventilation).  An irregular heart rhythm (arrhythmia).  A low red blood cell count (anemia).  Pregnancy.  Vomiting, diarrhea, fever, or other illnesses that cause body fluid loss (dehydration).  Diseases or conditions such as Parkinson's disease, high blood pressure (hypertension), diabetes, and thyroid problems.  Exposure to extreme heat. DIAGNOSIS  Your health care provider will ask about your symptoms, perform a physical exam, and perform an electrocardiogram (ECG) to record the electrical activity of your heart. Your health care provider may also perform other heart or blood tests to determine the cause of your dizziness. These may include:  Transthoracic echocardiogram (TTE). During echocardiography, sound waves are used to evaluate how blood flows through your heart.  Transesophageal echocardiogram (TEE).  Cardiac monitoring. This allows your health care provider to monitor your heart rate and rhythm in real time.  Holter monitor. This is a portable device that records your heartbeat and can help diagnose heart arrhythmias. It allows your health care provider to track your heart activity for several days if needed.  Stress tests by exercise or by giving medicine that makes the heart beat  faster. TREATMENT  Treatment of dizziness depends on the cause of your symptoms and can vary greatly. HOME CARE INSTRUCTIONS   Drink enough fluids to keep your urine clear or pale yellow. This is especially important in very hot weather. In older adults, it is also important in cold weather.  Take your medicine exactly as directed if your dizziness is caused by medicines. When taking blood pressure medicines, it is especially important to get up slowly.  Rise slowly from chairs and steady yourself until you feel okay.  In the morning, first sit up on the side of the bed. When you feel okay, stand slowly while holding onto something until you know your balance is fine.  Move your legs often if you need to stand in one place for a long time. Tighten and relax your muscles in your legs while standing.  Have someone stay with you for 1-2 days if dizziness continues to be a problem. Do this until you feel you are well enough to stay alone. Have the person call your health care provider if he or she notices changes in you that are concerning.  Do not drive or use heavy machinery if you feel dizzy.  Do not drink alcohol. SEEK IMMEDIATE MEDICAL CARE IF:   Your dizziness or light-headedness gets worse.  You feel nauseous or vomit.  You have problems talking, walking, or using your arms, hands, or legs.  You feel weak.  You are not thinking clearly or you have trouble forming sentences. It may take a friend or family member to notice this.  You have chest pain, abdominal pain, shortness of breath, or sweating.  Your vision changes.  You notice   any bleeding.  You have side effects from medicine that seems to be getting worse rather than better. MAKE SURE YOU:   Understand these instructions.  Will watch your condition.  Will get help right away if you are not doing well or get worse. Document Released: 07/14/2000 Document Revised: 01/23/2013 Document Reviewed: 08/07/2010 ExitCare  Patient Information 2015 [employer]. This information is not intended to replace advice given to you by your health care provider. Make sure you discuss any questions you have with your health care provider.  

## 2013-09-21 NOTE — Progress Notes (Signed)
Reason for visit: Dizziness  [name] [name] is a 68 y.o. female  History of present illness:  [name] [name] is a 68 year old right-handed white female with a history of cerebrovascular disease, migraine headaches, and new onset dizziness. The patient was on Prozac, she believes she was on 40 mg daily. This was discontinued as it was felt that this medication may interact with Plavix. The patient began having episodes of dizziness approximately 3-4 weeks following cessation of the Prozac dosing. The patient began having "zaps" of dizziness lasting a fraction of a second, but recurring frequently throughout the day. The patient indicates that she may have the episodes of dizziness with head movement or even while standing still. The episodes are more frequent while walking, but may occur while sitting, usually not while lying down. The patient has not had any sensation of syncope, and she has not fallen because of the dizziness. The patient may stagger, however. The patient reports no new numbness, weakness, speech disturbances, or vision changes. The patient does have a history of migraine headache, and she is having 1 or 2 headaches a week, but she does not relate the episodes of dizziness to this. The patient reports no problems with ear pain, or change in hearing. She has a history of cerebrovascular disease, with a prior left carotid stent placement. The last carotid Doppler study was in June 2014. She is sent to this office for an evaluation. The patient denies true vertigo.  Past Medical History  Diagnosis Date  . Vomiting     persistent  . Asthma   . Ventral hernia     hx of  . GERD (gastroesophageal reflux disease)   . Obesity 01-2010  . Hyperlipidemia   . Hypertension   . Irregular heart beat   . Coronary artery disease   . Heart murmur   . Shortness of breath   . Anxiety   . PONV (postoperative nausea and vomiting)   . Peripheral vascular disease     ? numbness, tingling arms and  legs  . Anemia     hx  . H/O hiatal hernia   . Neuromuscular disorder     ?  Marland Kitchen Dysrhythmia   . Mental disorder     hx of confusion  . Chronic kidney disease     frequency  . CHF (congestive heart failure)   . Anginal pain   . Myocardial infarction     04/1999, 02/2000, 01/2005; 2011; 2014  . Pneumonia 2000's    "once"  . Chronic bronchitis     "off and on all the time" (07/18/2013)  . Claustrophobia   . Type II diabetes mellitus   . Chronic lower back pain   . Other and unspecified angina pectoris   . Obstructive sleep apnea     "can't wear machine; I have claustrophobia" (07/18/2013)  . Migraine     "used to have them really bad; kind of eased up now; down to 1 q couple months" (07/18/2013)  . Headache     "probably 4 days/wk" (07/18/2013)  . Stroke 03/22/12    right side brain; denies residual on 07/18/2013)  . Osteoarthritis     "knees and hands" (07/18/2013)  . Bundle branch block, left     chronic/notes 07/18/2013    Past Surgical History  Procedure Laterality Date  . Tumor removal  1970's    ovarian  . Tibia fracture surgery Right 1970's    rods and pins  . Gastric bypass  1984    "stapeling"  . Total abdominal hysterectomy  1970's  . Ankle fracture surgery Left 1970's  . Fracture surgery    . Esophagogastroduodenoscopy  08/03/2011    Procedure: ESOPHAGOGASTRODUODENOSCOPY (EGD);  Surgeon: Shann Medal, MD;  Location: [facility];  Service: General;  Laterality: N/A;  . Gall stone removal  05/2003  . Root canal  10/2000  . Coronary artery bypass graft  1220/11    "CABG X5"  . Appendectomy  1970's    w/hysterectomy  . Cholecystectomy  ?1980's  . Coronary angioplasty with stent placement  01,02,05,06,07,08,11; 04/24/2013    "I've probably got ~ 10 stents by now" (04/24/2013)  . Coronary angioplasty with stent placement  06/13/2013    "got 4 stents today" (06/13/2013)  . Cardiac catheterization  10/10/2012    Dr [name].  . Carotid endarterectomy Left 03/2013  . Hernia  repair  ?89's X2    "in my stomach; had OR on it twice"    Family History  Problem Relation Age of Onset  . Heart disease Father     Heart Disease before age 84  . Diabetes Father   . Hyperlipidemia Father   . Hypertension Father   . Heart attack Father   . Deep vein thrombosis Father   . Diabetes      family hx of paternal grandmother, aunt and grandfather- uncles  . Hypertension Mother   . Cancer Sister     Social history:  reports that she has never smoked. She has never used smokeless tobacco. She reports that she does not drink alcohol or use illicit drugs.  Medications:  Current Outpatient Prescriptions on File Prior to Visit  Medication Sig Dispense Refill  . albuterol (PROVENTIL) (2.5 MG/3ML) 0.083% nebulizer solution Take 2.5 mg by nebulization every 6 (six) hours as needed for shortness of breath (when asthma is active).       Marland Kitchen albuterol (PROVENTIL,VENTOLIN) 90 MCG/ACT inhaler Inhale 2 puffs into the lungs every 6 (six) hours as needed for shortness of breath.       . ALPRAZolam (XANAX) 0.5 MG tablet Take 0.5 mg by mouth 2 (two) times daily as needed for anxiety. For anxiety - Patient takes 1 in the morning and 1 in the evening daily. If needed she may take one in the afternoon.      Marland Kitchen amLODipine (NORVASC) 10 MG tablet Take 1 tablet (10 mg total) by mouth daily.  30 tablet  6  . aspirin EC 81 MG tablet Take 81 mg by mouth daily.      . B Complex-C (B-COMPLEX WITH VITAMIN C) tablet Take 1 tablet by mouth daily.      . calcium citrate (CALCITRATE - DOSED IN MG ELEMENTAL CALCIUM) 950 MG tablet Take 200 mg of elemental calcium by mouth daily.      . Cholecalciferol (VITAMIN D) 2000 UNITS tablet Take 2,000 Units by mouth daily.      . cholestyramine (QUESTRAN) 4 GM/DOSE powder Take 2 g by mouth 2 (two) times daily with a meal. (mixed in juice or light kool-aid).      . cloNIDine (CATAPRES) 0.2 MG tablet Take 0.2 mg by mouth 2 (two) times daily.      . clopidogrel (PLAVIX) 75  MG tablet TAKE 2 TABS TONIGHT (150 TOTAL), AND THEN 75 MG THEREAFTER  90 tablet  3  . ezetimibe (ZETIA) 10 MG tablet Take 1 tablet (10 mg total) by mouth daily.  30 tablet  11  .  Fluticasone-Salmeterol (ADVAIR DISKUS) 250-50 MCG/DOSE AEPB Inhale 1 puff into the lungs every 12 (twelve) hours.       . furosemide (LASIX) 40 MG tablet Take 40 mg by mouth every morning.      . furosemide (LASIX) 40 MG tablet Take 20-40 mg by mouth every evening. Take 20mg  every evening but may take a whole tablet of 40mg  in the evening depending on swelling.      . gabapentin (NEURONTIN) 600 MG tablet Take 600 mg by mouth at bedtime.       Marland Kitchen HUMALOG KWIKPEN 100 UNIT/ML SOPN Inject 5 Units into the skin 2 (two) times daily with a meal. 5-10 units sliding scale      . hydrALAZINE (APRESOLINE) 50 MG tablet Take 50 mg by mouth 3 (three) times daily as needed (for high blood pressure). If BP is >175      . HYDROcodone-acetaminophen (NORCO/VICODIN) 5-325 MG per tablet Take 1 tablet by mouth every evening.       . insulin glargine (LANTUS) 100 UNIT/ML injection Inject 48 Units into the skin every morning.       . insulin glargine (LANTUS) 100 UNIT/ML injection Inject 4-16 Units into the skin at bedtime as needed (For blood sugar = or >150). Patient takes: 0 units if BG <150,  4 units if BG 150-174,  8 units if BG 175-199,  16 units if BG >200      . ipratropium-albuterol (DUONEB) 0.5-2.5 (3) MG/3ML SOLN Take 3 mLs by nebulization as needed (If severe asthma , use every 4 hours for 10 days).       Marland Kitchen loperamide (IMODIUM) 2 MG capsule Take 2 mg by mouth as needed for diarrhea or loose stools.      Marland Kitchen losartan (COZAAR) 100 MG tablet Take 100 mg by mouth 2 (two) times daily.       . Magnesium Oxide 250 MG TABS Take 250 mg by mouth 2 (two) times daily.       . metoprolol tartrate (LOPRESSOR) 25 MG tablet Take 12.5 mg by mouth 2 (two) times daily.       . Multiple Vitamin (MULTIVITAMIN) tablet Take 1 tablet by mouth daily.        .  nitroGLYCERIN (NITROSTAT) 0.3 MG SL tablet Place 0.3 mg under the tongue every 5 (five) minutes x 3 doses as needed for chest pain.       Marland Kitchen omeprazole (PRILOSEC) 20 MG capsule Take 20 mg by mouth daily.       . ondansetron (ZOFRAN) 4 MG tablet Take 1 tablet (4 mg total) by mouth 2 (two) times daily as needed. For nausea  20 tablet  0  . ranolazine (RANEXA) 1000 MG SR tablet Take 1 tablet (1,000 mg total) by mouth 2 (two) times daily.  60 tablet  11  . rosuvastatin (CRESTOR) 40 MG tablet Take 40 mg by mouth daily.       Marland Kitchen topiramate (TOPAMAX) 50 MG tablet Take 1 tablet (50 mg total) by mouth 2 (two) times daily.  60 tablet  6  . triamterene-hydrochlorothiazide (DYAZIDE) 37.5-25 MG per capsule Take 1 capsule by mouth daily as needed (For extra fluid).      . vitamin B-12 (CYANOCOBALAMIN) 1000 MCG tablet Take 1,000 mcg by mouth daily.      Marland Kitchen zinc gluconate 50 MG tablet Take 50 mg by mouth daily.      Marland Kitchen zolpidem (AMBIEN) 10 MG tablet Take 10 mg by mouth at bedtime as  needed for sleep.        No current facility-administered medications on file prior to visit.      Allergies  Allergen Reactions  . Amoxicillin Shortness Of Breath and Rash  . Erythromycin Shortness Of Breath and Other (See Comments)    Trouble swallowing  . Penicillins Shortness Of Breath and Rash  . Metformin And Related     Stomach pain, cold sweats, joint pain, burred vision, dizziness  . Tape Other (See Comments)    Plastic tape causes skin to rip if left on for long periods of time  . Isosorbide Mononitrate [Isosorbide]     Joint aches, muscles hurt, difficult to walk    ROS:  Out of a complete 14 system review of symptoms, the patient complains only of the following symptoms, and all other reviewed systems are negative.  Chills, fever, weight change Ear discharge Cough, wheezing, shortness of breath Palpitations of the heart, heart murmur Excessive thirst Restless legs, insomnia Frequent waking, and daytime  sleepiness Environmental allergies Muscle cramps Dizziness, headache, speech difficulty Agitation, decreased concentration, depression, anxiety  Blood pressure 134/66, pulse 59, weight 280 lb (127.007 kg).  Blood pressure, right arm, standing is 132/80. Blood pressure, sitting, right arm, is 136/80.  Physical Exam  General: The patient is alert and cooperative at the time of the examination. The patient is markedly obese.  Eyes: Pupils are equal, round, and reactive to light. Discs are flat bilaterally.  Neck: The neck is supple, no carotid bruits are noted.  Respiratory: The respiratory examination is clear.  Cardiovascular: The cardiovascular examination reveals a regular rate and rhythm, no obvious murmurs or rubs are noted.  Skin: Extremities are with 3+ edema below the knees bilaterally.  Neurologic Exam  Mental status: The patient is alert and oriented x 3 at the time of the examination. The patient has apparent normal recent and remote memory, with an apparently normal attention span and concentration ability.  Cranial nerves: Facial symmetry is present. There is good sensation of the face to pinprick and soft touch bilaterally. The strength of the facial muscles and the muscles to head turning and shoulder shrug are normal bilaterally. Speech is well enunciated, no aphasia or dysarthria is noted. Extraocular movements are full. Visual fields are full. The tongue is midline, and the patient has symmetric elevation of the soft palate. No obvious hearing deficits are noted.  Motor: The motor testing reveals 5 over 5 strength of all 4 extremities. Good symmetric motor tone is noted throughout.  Sensory: Sensory testing is intact to pinprick, soft touch, vibration sensation, and position sense on all 4 extremities. No evidence of extinction is noted.  Coordination: Cerebellar testing reveals good finger-nose-finger and heel-to-shin bilaterally.  Gait and station: Gait is  slightly unsteady, slightly wide-based. Tandem gait is unsteady. Romberg is negative. No drift is seen.  Reflexes: Deep tendon reflexes are symmetric and normal bilaterally. Toes are downgoing bilaterally.   Assessment/Plan:  1. History of cerebrovascular disease  2. Episodic dizziness  3. History of migraine headache  The patient has a history of cerebrovascular disease, and for this reason, further evaluation will be undertaken. The patient will undergo a MRI of the brain, and a carotid Doppler study. The episodes of dizziness are quite brief, and shocklike in description. This would be consistent with SSRI antidepressant withdrawal, but Prozac is less likely to produce this withdrawal syndrome that other SSRI antidepressants. The patient indicates that she has gotten worse over the last 2 weeks. The  patient currently is on Cymbalta, started 2 weeks ago. She was on Effexor for a period of time after she was taken off of the Prozac. She will followup in about 3 months. The patient is to get a cane for ambulation to ensure safety with walking.  Jill Alexanders MD 09/21/2013 7:22 PM  [facility] [address] [place], [place] [address]  Phone [phone] Fax [phone]

## 2013-09-21 NOTE — Progress Notes (Signed)
[name] [name] says she spoke with Dr Sandi Mariscal he recommends that she follow up with the neurologist regarding her dizziness. Dr Deboraha Sprang office called they will make an appointment for her to follow up with Dr Jannifer Franklin.

## 2013-09-21 NOTE — Telephone Encounter (Signed)
appt was scheduled with [name] [name] for patient  Today @ 2:30. She will contact patient.

## 2013-09-24 ENCOUNTER — Encounter (HOSPITAL_COMMUNITY): Payer: Medicare Other

## 2013-09-24 ENCOUNTER — Encounter (HOSPITAL_COMMUNITY)
Admission: RE | Admit: 2013-09-24 | Discharge: 2013-09-24 | Disposition: A | Discharge status: Home / Self Care | Payer: Medicare Other | Source: Ambulatory Visit | Attending: Interventional Cardiology | Admitting: Interventional Cardiology

## 2013-09-24 DIAGNOSIS — Z5189 Encounter for other specified aftercare: Secondary | ICD-10-CM | POA: Diagnosis not present

## 2013-09-24 LAB — GLUCOSE, CAPILLARY: Glucose-Capillary: 149 mg/dL — ABNORMAL HIGH (ref 70–99)

## 2013-09-24 NOTE — Progress Notes (Signed)
[name] [name] returned to exercise today at cardiac rehab.  [name] [name] used a wheelchair while walking today during exercise. Will continue to monitor the patient throughout  the program.

## 2013-09-26 ENCOUNTER — Encounter (HOSPITAL_COMMUNITY): Payer: Medicare Other

## 2013-09-26 ENCOUNTER — Encounter (HOSPITAL_COMMUNITY)
Admission: RE | Admit: 2013-09-26 | Discharge: 2013-09-26 | Disposition: A | Discharge status: Home / Self Care | Payer: Medicare Other | Source: Ambulatory Visit | Attending: Interventional Cardiology | Admitting: Interventional Cardiology

## 2013-09-26 DIAGNOSIS — Z5189 Encounter for other specified aftercare: Secondary | ICD-10-CM | POA: Diagnosis not present

## 2013-09-26 LAB — GLUCOSE, CAPILLARY: Glucose-Capillary: 166 mg/dL — ABNORMAL HIGH (ref 70–99)

## 2013-09-27 ENCOUNTER — Ambulatory Visit (INDEPENDENT_AMBULATORY_CARE_PROVIDER_SITE_OTHER): Payer: Medicare Other

## 2013-09-27 DIAGNOSIS — G458 Other transient cerebral ischemic attacks and related syndromes: Secondary | ICD-10-CM

## 2013-09-27 DIAGNOSIS — R42 Dizziness and giddiness: Secondary | ICD-10-CM

## 2013-09-28 ENCOUNTER — Encounter (HOSPITAL_COMMUNITY): Payer: Medicare Other

## 2013-09-28 ENCOUNTER — Encounter (HOSPITAL_COMMUNITY)
Admission: RE | Admit: 2013-09-28 | Discharge: 2013-09-28 | Disposition: A | Discharge status: Home / Self Care | Payer: Medicare Other | Source: Ambulatory Visit | Attending: Interventional Cardiology | Admitting: Interventional Cardiology

## 2013-09-28 ENCOUNTER — Ambulatory Visit: Payer: Self-pay | Admitting: Neurology

## 2013-09-28 DIAGNOSIS — Z5189 Encounter for other specified aftercare: Secondary | ICD-10-CM | POA: Diagnosis not present

## 2013-09-28 LAB — GLUCOSE, CAPILLARY: Glucose-Capillary: 158 mg/dL — ABNORMAL HIGH (ref 70–99)

## 2013-10-01 ENCOUNTER — Encounter (HOSPITAL_COMMUNITY): Payer: Medicare Other

## 2013-10-01 ENCOUNTER — Telehealth: Payer: Self-pay | Admitting: Neurology

## 2013-10-01 ENCOUNTER — Encounter (HOSPITAL_COMMUNITY)
Admission: RE | Admit: 2013-10-01 | Discharge: 2013-10-01 | Disposition: A | Discharge status: Home / Self Care | Payer: Medicare Other | Source: Ambulatory Visit | Attending: Interventional Cardiology | Admitting: Interventional Cardiology

## 2013-10-01 DIAGNOSIS — Z5189 Encounter for other specified aftercare: Secondary | ICD-10-CM | POA: Diagnosis not present

## 2013-10-01 LAB — GLUCOSE, CAPILLARY: Glucose-Capillary: 150 mg/dL — ABNORMAL HIGH (ref 70–99)

## 2013-10-01 NOTE — Telephone Encounter (Signed)
I called patient. A carotid Doppler study shows external carotid artery stenosis, no significant stenosis of the internal carotid artery system. I'll call the patient when I get the results of the MRI scan.

## 2013-10-02 ENCOUNTER — Inpatient Hospital Stay: Admission: RE | Admit: 2013-10-02 | Payer: Medicare Other | Source: Ambulatory Visit

## 2013-10-03 ENCOUNTER — Encounter (HOSPITAL_COMMUNITY): Payer: Medicare Other

## 2013-10-03 ENCOUNTER — Encounter (HOSPITAL_COMMUNITY)
Admission: RE | Admit: 2013-10-03 | Discharge: 2013-10-03 | Disposition: A | Discharge status: Home / Self Care | Payer: Medicare Other | Source: Ambulatory Visit | Attending: Interventional Cardiology | Admitting: Interventional Cardiology

## 2013-10-03 DIAGNOSIS — I5032 Chronic diastolic (congestive) heart failure: Secondary | ICD-10-CM | POA: Insufficient documentation

## 2013-10-03 DIAGNOSIS — I6529 Occlusion and stenosis of unspecified carotid artery: Secondary | ICD-10-CM | POA: Insufficient documentation

## 2013-10-03 DIAGNOSIS — I059 Rheumatic mitral valve disease, unspecified: Secondary | ICD-10-CM | POA: Insufficient documentation

## 2013-10-03 DIAGNOSIS — I2510 Atherosclerotic heart disease of native coronary artery without angina pectoris: Secondary | ICD-10-CM | POA: Insufficient documentation

## 2013-10-03 DIAGNOSIS — M629 Disorder of muscle, unspecified: Secondary | ICD-10-CM | POA: Diagnosis not present

## 2013-10-03 DIAGNOSIS — I200 Unstable angina: Secondary | ICD-10-CM | POA: Insufficient documentation

## 2013-10-03 DIAGNOSIS — Z5189 Encounter for other specified aftercare: Secondary | ICD-10-CM | POA: Insufficient documentation

## 2013-10-03 DIAGNOSIS — R0789 Other chest pain: Secondary | ICD-10-CM | POA: Diagnosis not present

## 2013-10-03 DIAGNOSIS — I252 Old myocardial infarction: Secondary | ICD-10-CM | POA: Diagnosis not present

## 2013-10-03 DIAGNOSIS — M6289 Other specified disorders of muscle: Secondary | ICD-10-CM | POA: Insufficient documentation

## 2013-10-03 LAB — GLUCOSE, CAPILLARY: Glucose-Capillary: 182 mg/dL — ABNORMAL HIGH (ref 70–99)

## 2013-10-05 ENCOUNTER — Encounter (HOSPITAL_COMMUNITY): Payer: Medicare Other

## 2013-10-05 ENCOUNTER — Encounter (HOSPITAL_COMMUNITY)
Admission: RE | Admit: 2013-10-05 | Discharge: 2013-10-05 | Disposition: A | Discharge status: Home / Self Care | Payer: Medicare Other | Source: Ambulatory Visit | Attending: Interventional Cardiology | Admitting: Interventional Cardiology

## 2013-10-05 DIAGNOSIS — Z5189 Encounter for other specified aftercare: Secondary | ICD-10-CM | POA: Diagnosis not present

## 2013-10-05 LAB — GLUCOSE, CAPILLARY: Glucose-Capillary: 184 mg/dL — ABNORMAL HIGH (ref 70–99)

## 2013-10-07 ENCOUNTER — Ambulatory Visit
Admission: RE | Admit: 2013-10-07 | Discharge: 2013-10-07 | Disposition: A | Discharge status: Home / Self Care | Payer: Medicare Other | Source: Ambulatory Visit | Attending: Neurology | Admitting: Neurology

## 2013-10-07 DIAGNOSIS — R42 Dizziness and giddiness: Secondary | ICD-10-CM

## 2013-10-07 DIAGNOSIS — G458 Other transient cerebral ischemic attacks and related syndromes: Secondary | ICD-10-CM

## 2013-10-08 ENCOUNTER — Encounter (HOSPITAL_COMMUNITY): Payer: Medicare Other

## 2013-10-09 ENCOUNTER — Telehealth: Payer: Self-pay | Admitting: Neurology

## 2013-10-09 ENCOUNTER — Telehealth: Payer: Self-pay | Admitting: *Deleted

## 2013-10-09 DIAGNOSIS — I6340 Cerebral infarction due to embolism of unspecified cerebral artery: Secondary | ICD-10-CM

## 2013-10-09 NOTE — Telephone Encounter (Signed)
MRI the brain shows a new right occipital and medial temporal infarct when compared to prior study done in February of 2013.  I called patient. The patient is unclear when the new stroke could have occurred. No visual disturbances. Not clear that it this is related to her business, but it could be. The patient is already on Topamax, and she has alprazolam, not clear that these medications help her. The patient is on aspirin and Plavix. The patient is on a multitude of medications, many of which could cause dizziness as well. I will check a repeat 2-D echocardiogram, last study was done 1 year ago.    MRI brain 10/08/2013:  Impression   Abnormal MRI scan of the brain showing remote age right  occipital and medial temporal infarct which appears new compared with  previous MRI scan and stable nonspecific left frontal periventricular  white matter hyperintensity. No acute lesions are noted

## 2013-10-09 NOTE — Telephone Encounter (Signed)
appt for 2Decho 10/16/13@ 11:15. [employer] office.

## 2013-10-10 ENCOUNTER — Encounter (HOSPITAL_COMMUNITY)
Admission: RE | Admit: 2013-10-10 | Discharge: 2013-10-10 | Disposition: A | Discharge status: Home / Self Care | Payer: Medicare Other | Source: Ambulatory Visit | Attending: Interventional Cardiology | Admitting: Interventional Cardiology

## 2013-10-10 ENCOUNTER — Encounter (HOSPITAL_COMMUNITY): Payer: Medicare Other

## 2013-10-10 DIAGNOSIS — Z5189 Encounter for other specified aftercare: Secondary | ICD-10-CM | POA: Diagnosis not present

## 2013-10-10 LAB — GLUCOSE, CAPILLARY: Glucose-Capillary: 180 mg/dL — ABNORMAL HIGH (ref 70–99)

## 2013-10-12 ENCOUNTER — Encounter (HOSPITAL_COMMUNITY): Payer: Medicare Other

## 2013-10-12 ENCOUNTER — Encounter (HOSPITAL_COMMUNITY)
Admission: RE | Admit: 2013-10-12 | Discharge: 2013-10-12 | Disposition: A | Discharge status: Home / Self Care | Payer: Medicare Other | Source: Ambulatory Visit | Attending: Cardiology | Admitting: Cardiology

## 2013-10-12 DIAGNOSIS — Z5189 Encounter for other specified aftercare: Secondary | ICD-10-CM | POA: Diagnosis not present

## 2013-10-12 LAB — GLUCOSE, CAPILLARY: Glucose-Capillary: 167 mg/dL — ABNORMAL HIGH (ref 70–99)

## 2013-10-12 NOTE — Progress Notes (Signed)
Fadumo told me the results of her MRI. I called Dr Jannifer Franklin who said he is fine with Mrs [name] [name] continuing with exercise at cardiac rehab.

## 2013-10-15 ENCOUNTER — Encounter (HOSPITAL_COMMUNITY)
Admission: RE | Admit: 2013-10-15 | Discharge: 2013-10-15 | Disposition: A | Discharge status: Home / Self Care | Payer: Medicare Other | Source: Ambulatory Visit | Attending: Cardiology | Admitting: Cardiology

## 2013-10-15 ENCOUNTER — Encounter (HOSPITAL_COMMUNITY): Payer: Medicare Other

## 2013-10-15 DIAGNOSIS — Z5189 Encounter for other specified aftercare: Secondary | ICD-10-CM | POA: Diagnosis not present

## 2013-10-15 LAB — GLUCOSE, CAPILLARY: Glucose-Capillary: 140 mg/dL — ABNORMAL HIGH (ref 70–99)

## 2013-10-16 ENCOUNTER — Ambulatory Visit (HOSPITAL_COMMUNITY): Payer: Medicare Other | Attending: Neurology | Admitting: Radiology

## 2013-10-16 ENCOUNTER — Telehealth: Payer: Self-pay | Admitting: Neurology

## 2013-10-16 DIAGNOSIS — I6350 Cerebral infarction due to unspecified occlusion or stenosis of unspecified cerebral artery: Secondary | ICD-10-CM | POA: Insufficient documentation

## 2013-10-16 DIAGNOSIS — E785 Hyperlipidemia, unspecified: Secondary | ICD-10-CM | POA: Insufficient documentation

## 2013-10-16 DIAGNOSIS — E119 Type 2 diabetes mellitus without complications: Secondary | ICD-10-CM | POA: Insufficient documentation

## 2013-10-16 DIAGNOSIS — I059 Rheumatic mitral valve disease, unspecified: Secondary | ICD-10-CM | POA: Diagnosis present

## 2013-10-16 DIAGNOSIS — I6340 Cerebral infarction due to embolism of unspecified cerebral artery: Secondary | ICD-10-CM

## 2013-10-16 DIAGNOSIS — I10 Essential (primary) hypertension: Secondary | ICD-10-CM | POA: Diagnosis not present

## 2013-10-16 NOTE — Progress Notes (Signed)
Echocardiogram performed.  

## 2013-10-16 NOTE — Telephone Encounter (Signed)
I called patient. The echocardiogram appears to be relatively unremarkable, some left atrial dilation, but good left ventricular function. No obvious source of embolic stroke.   2-D echocardiogram results 10/16/2013:  Study Conclusions  - Left ventricle: The cavity size was normal. There was mild concentric hypertrophy. Systolic function was normal. The estimated ejection fraction was in the range of 60% to 65%. Wall motion was normal; there were no regional wall motion abnormalities. Features are consistent with a pseudonormal left ventricular filling pattern, with concomitant abnormal relaxation and increased filling pressure (grade 2 diastolic dysfunction). Doppler parameters are consistent with high ventricular filling pressure. - Aortic valve: Trileaflet; mildly thickened, mildly calcified leaflets. There was mild stenosis. There was no regurgitation. - Aortic root: The aortic root was normal in size. - Mitral valve: Calcified annulus. Moderately thickened leaflets . The findings are consistent with mild stenosis. There was mild to moderate regurgitation. Valve area by continuity equation (using LVOT flow): 1.3 cm^2. - Left atrium: The atrium was moderately dilated. - Right ventricle: The cavity size was mildly dilated. Wall thickness was normal. - Right atrium: The atrium was mildly dilated. - Tricuspid valve: There was mild regurgitation. - Pulmonic valve: There was no regurgitation. - Pulmonary arteries: PA peak pressure: 46 mm Hg (S).

## 2013-10-17 ENCOUNTER — Encounter (HOSPITAL_COMMUNITY)
Admission: RE | Admit: 2013-10-17 | Discharge: 2013-10-17 | Disposition: A | Discharge status: Home / Self Care | Payer: Medicare Other | Source: Ambulatory Visit | Attending: Cardiology | Admitting: Cardiology

## 2013-10-17 DIAGNOSIS — Z5189 Encounter for other specified aftercare: Secondary | ICD-10-CM | POA: Diagnosis not present

## 2013-10-17 LAB — GLUCOSE, CAPILLARY: Glucose-Capillary: 181 mg/dL — ABNORMAL HIGH (ref 70–99)

## 2013-10-19 ENCOUNTER — Encounter (HOSPITAL_COMMUNITY)
Admission: RE | Admit: 2013-10-19 | Discharge: 2013-10-19 | Disposition: A | Discharge status: Home / Self Care | Payer: Medicare Other | Source: Ambulatory Visit | Attending: Cardiology | Admitting: Cardiology

## 2013-10-19 DIAGNOSIS — Z5189 Encounter for other specified aftercare: Secondary | ICD-10-CM | POA: Diagnosis not present

## 2013-10-22 ENCOUNTER — Encounter (HOSPITAL_COMMUNITY)
Admission: RE | Admit: 2013-10-22 | Discharge: 2013-10-22 | Disposition: A | Discharge status: Home / Self Care | Payer: Medicare Other | Source: Ambulatory Visit | Attending: Cardiology | Admitting: Cardiology

## 2013-10-22 DIAGNOSIS — Z5189 Encounter for other specified aftercare: Secondary | ICD-10-CM | POA: Diagnosis not present

## 2013-10-22 LAB — GLUCOSE, CAPILLARY: Glucose-Capillary: 197 mg/dL — ABNORMAL HIGH (ref 70–99)

## 2013-10-23 ENCOUNTER — Telehealth: Payer: Self-pay | Admitting: Surgery

## 2013-10-23 NOTE — Telephone Encounter (Signed)
Thank you for letting me know [name] [name]

## 2013-10-23 NOTE — Telephone Encounter (Signed)
I received a call from [place] on 10/18/13, she stated that she had a recent carotid duplex at [facility] due to a stroke. She wanted to know if we needed to repeat the study that is already scheduled on 10/29/13.   I printed the results and asked Dr Trula Slade to review and advise if we should repeat study, and if not, should she still see Clemon Chambers, NP for her 6 month follow up.   Per handwritten note from Dr Trula Slade, "Do not repeat, have Vinnie Level evaluate".  I spoke with Johnnie, today 10/23/13, to let her know of Dr Aleen Campi recommendation of seeing NP only without repeat study. Darbey agrees with plan and will see Vinnie Level on 10/29/13 @ 11:40 as previously scheduled. She knows that we have canceled the duplex at 10:30am.

## 2013-10-24 ENCOUNTER — Encounter (HOSPITAL_COMMUNITY)
Admission: RE | Admit: 2013-10-24 | Discharge: 2013-10-24 | Disposition: A | Discharge status: Home / Self Care | Payer: Medicare Other | Source: Ambulatory Visit | Attending: Cardiology | Admitting: Cardiology

## 2013-10-24 DIAGNOSIS — Z5189 Encounter for other specified aftercare: Secondary | ICD-10-CM | POA: Diagnosis not present

## 2013-10-24 LAB — GLUCOSE, CAPILLARY: Glucose-Capillary: 210 mg/dL — ABNORMAL HIGH (ref 70–99)

## 2013-10-26 ENCOUNTER — Encounter: Payer: Self-pay | Admitting: Family

## 2013-10-26 ENCOUNTER — Encounter (HOSPITAL_COMMUNITY)
Admission: RE | Admit: 2013-10-26 | Discharge: 2013-10-26 | Disposition: A | Discharge status: Home / Self Care | Payer: Medicare Other | Source: Ambulatory Visit | Attending: Cardiology | Admitting: Cardiology

## 2013-10-26 DIAGNOSIS — Z5189 Encounter for other specified aftercare: Secondary | ICD-10-CM | POA: Diagnosis not present

## 2013-10-29 ENCOUNTER — Encounter (HOSPITAL_COMMUNITY)
Admission: RE | Admit: 2013-10-29 | Discharge: 2013-10-29 | Disposition: A | Discharge status: Home / Self Care | Payer: Medicare Other | Source: Ambulatory Visit | Attending: Interventional Cardiology | Admitting: Interventional Cardiology

## 2013-10-29 ENCOUNTER — Other Ambulatory Visit (HOSPITAL_COMMUNITY): Payer: Medicare Other

## 2013-10-29 ENCOUNTER — Ambulatory Visit (INDEPENDENT_AMBULATORY_CARE_PROVIDER_SITE_OTHER): Payer: Medicare Other | Admitting: Family

## 2013-10-29 ENCOUNTER — Encounter: Payer: Self-pay | Admitting: Family

## 2013-10-29 VITALS — BP 120/62 | HR 42 | Resp 16 | Ht 62.0 in | Wt 272.0 lb

## 2013-10-29 DIAGNOSIS — I6529 Occlusion and stenosis of unspecified carotid artery: Secondary | ICD-10-CM

## 2013-10-29 DIAGNOSIS — Z5189 Encounter for other specified aftercare: Secondary | ICD-10-CM | POA: Diagnosis not present

## 2013-10-29 LAB — GLUCOSE, CAPILLARY: Glucose-Capillary: 170 mg/dL — ABNORMAL HIGH (ref 70–99)

## 2013-10-29 NOTE — Progress Notes (Signed)
Established Carotid Patient   History of Present Illness  [name] [name] is a 68 y.o. female patient of Dr. Trula Slade who is s/p left carotid stent placement on 03/20/2013 with distal embolic protection. This was done in the setting of a symptomatic left carotid stenosis. The patient was having nearly daily episodes of aphasia.  She returns today for follow up. Dr. Jannifer Franklin, her neurologist, requested a carotid Duplex that was performed on 09/27/13, copy of result reviewed which shows minimal bilateral ICA stenosis.    July, 2015 she had what she thinks was a stroke as manifested by feeling hot, off balance, headache, ataxia.  Dr. Jannifer Franklin requested an MRI of her brain to evaluate this and pt states she was told that she had another stroke; she still feels off balance, occasional headaches, occasional crawling sensation on left side of face with exertion, occasional mild expressive aphasia. February, 2014 she lost vision in both eyes, her vision has since improved with some loss of acuity. She denies hemiparesis.  She has continued followup with Dr. Estanislado Pandy for vertebral artery stenosis as well as intracranial aneurysm.   Pt reports New Medical or Surgical History: hospitalized 6 times at [facility] since February, 2015 for ischemic CAD, 4 stents place; chest pain. Her cardiologist is Dr. [name].  Pt Diabetic: Yes, pt states not in good control since hospitalized several times, is improving however, Dr. Buddy Duty is her endocrinologist.  Pt smoker: non-smoker  Pt meds include: Statin : Yes ASA: Yes Other anticoagulants/antiplatelets: Plavix   Past Medical History  Diagnosis Date  . Vomiting     persistent  . Asthma   . Ventral hernia     hx of  . GERD (gastroesophageal reflux disease)   . Obesity 01-2010  . Hyperlipidemia   . Hypertension   . Irregular heart beat   . Coronary artery disease   . Heart murmur   . Shortness of breath   . Anxiety   . PONV (postoperative nausea and vomiting)    . Peripheral vascular disease     ? numbness, tingling arms and legs  . Anemia     hx  . H/O hiatal hernia   . Neuromuscular disorder     ?  Marland Kitchen Dysrhythmia   . Mental disorder     hx of confusion  . Chronic kidney disease     frequency  . CHF (congestive heart failure)   . Anginal pain   . Myocardial infarction     04/1999, 02/2000, 01/2005; 2011; 2014  . Pneumonia 2000's    "once"  . Chronic bronchitis     "off and on all the time" (07/18/2013)  . Claustrophobia   . Type II diabetes mellitus   . Chronic lower back pain   . Other and unspecified angina pectoris   . Obstructive sleep apnea     "can't wear machine; I have claustrophobia" (07/18/2013)  . Migraine     "used to have them really bad; kind of eased up now; down to 1 q couple months" (07/18/2013)  . Headache     "probably 4 days/wk" (07/18/2013)  . Stroke 03/22/12    right side brain; denies residual on 07/18/2013)  . Osteoarthritis     "knees and hands" (07/18/2013)  . Bundle branch block, left     chronic/notes 07/18/2013    Social History History  Substance Use Topics  . Smoking status: Never Smoker   . Smokeless tobacco: Never Used  . Alcohol Use: No  Family History Family History  Problem Relation Age of Onset  . Heart disease Father     Heart Disease before age 56  . Diabetes Father   . Hyperlipidemia Father   . Hypertension Father   . Heart attack Father   . Deep vein thrombosis Father   . Diabetes      family hx of paternal grandmother, aunt and grandfather- uncles  . Hypertension Mother   . Cancer Sister     Surgical History Past Surgical History  Procedure Laterality Date  . Tumor removal  1970's    ovarian  . Tibia fracture surgery Right 1970's    rods and pins  . Gastric bypass  1984    "stapeling"  . Total abdominal hysterectomy  1970's  . Ankle fracture surgery Left 1970's  . Fracture surgery    . Esophagogastroduodenoscopy  08/03/2011    Procedure: ESOPHAGOGASTRODUODENOSCOPY  (EGD);  Surgeon: Shann Medal, MD;  Location: [facility];  Service: General;  Laterality: N/A;  . Gall stone removal  05/2003  . Root canal  10/2000  . Coronary artery bypass graft  1220/11    "CABG X5"  . Appendectomy  1970's    w/hysterectomy  . Cholecystectomy  ?1980's  . Coronary angioplasty with stent placement  01,02,05,06,07,08,11; 04/24/2013    "I've probably got ~ 10 stents by now" (04/24/2013)  . Coronary angioplasty with stent placement  06/13/2013    "got 4 stents today" (06/13/2013)  . Cardiac catheterization  10/10/2012    Dr [name].  . Carotid endarterectomy Left 03/2013  . Hernia repair  ?72's X2    "in my stomach; had OR on it twice"    Allergies  Allergen Reactions  . Amoxicillin Shortness Of Breath and Rash  . Erythromycin Shortness Of Breath and Other (See Comments)    Trouble swallowing  . Penicillins Shortness Of Breath and Rash  . Metformin And Related     Stomach pain, cold sweats, joint pain, burred vision, dizziness  . Tape Other (See Comments)    Plastic tape causes skin to rip if left on for long periods of time  . Isosorbide Mononitrate [Isosorbide]     Joint aches, muscles hurt, difficult to walk    Current Outpatient Prescriptions  Medication Sig Dispense Refill  . albuterol (PROVENTIL) (2.5 MG/3ML) 0.083% nebulizer solution Take 2.5 mg by nebulization every 6 (six) hours as needed for shortness of breath (when asthma is active).       Marland Kitchen albuterol (PROVENTIL,VENTOLIN) 90 MCG/ACT inhaler Inhale 2 puffs into the lungs every 6 (six) hours as needed for shortness of breath.       . ALPRAZolam (XANAX) 0.5 MG tablet Take 0.5 mg by mouth 2 (two) times daily as needed for anxiety. For anxiety - Patient takes 1 in the morning and 1 in the evening daily. If needed she may take one in the afternoon.      Marland Kitchen amLODipine (NORVASC) 10 MG tablet Take 1 tablet (10 mg total) by mouth daily.  30 tablet  6  . aspirin EC 81 MG tablet Take 81 mg by mouth daily.      . B  Complex-C (B-COMPLEX WITH VITAMIN C) tablet Take 1 tablet by mouth daily.      . calcium citrate (CALCITRATE - DOSED IN MG ELEMENTAL CALCIUM) 950 MG tablet Take 200 mg of elemental calcium by mouth daily.      . Cholecalciferol (VITAMIN D) 2000 UNITS tablet Take 2,000 Units by mouth daily.      Marland Kitchen  cholestyramine (QUESTRAN) 4 GM/DOSE powder Take 2 g by mouth 2 (two) times daily with a meal. (mixed in juice or light kool-aid).      . cloNIDine (CATAPRES) 0.2 MG tablet Take 0.2 mg by mouth 2 (two) times daily.      . clopidogrel (PLAVIX) 75 MG tablet TAKE 2 TABS TONIGHT (150 TOTAL), AND THEN 75 MG THEREAFTER  90 tablet  3  . DULoxetine (CYMBALTA) 20 MG capsule Take 20 mg by mouth 2 (two) times daily.      Marland Kitchen ezetimibe (ZETIA) 10 MG tablet Take 1 tablet (10 mg total) by mouth daily.  30 tablet  11  . Fluticasone-Salmeterol (ADVAIR DISKUS) 250-50 MCG/DOSE AEPB Inhale 1 puff into the lungs every 12 (twelve) hours.       . furosemide (LASIX) 40 MG tablet Take 40 mg by mouth every morning.      . furosemide (LASIX) 40 MG tablet Take 20-40 mg by mouth every evening. Take 20mg  every evening but may take a whole tablet of 40mg  in the evening depending on swelling.      . gabapentin (NEURONTIN) 600 MG tablet Take 600 mg by mouth at bedtime.       Marland Kitchen HUMALOG KWIKPEN 100 UNIT/ML SOPN Inject 10 Units into the skin 2 (two) times daily with a meal. 5-10 units sliding scale      . hydrALAZINE (APRESOLINE) 50 MG tablet Take 50 mg by mouth 3 (three) times daily as needed (for high blood pressure). If BP is >175      . HYDROcodone-acetaminophen (NORCO/VICODIN) 5-325 MG per tablet Take 1 tablet by mouth every evening.       . insulin glargine (LANTUS) 100 UNIT/ML injection Inject 48 Units into the skin every morning.       . insulin glargine (LANTUS) 100 UNIT/ML injection Inject 4-16 Units into the skin at bedtime as needed (For blood sugar = or >150). Patient takes: 0 units if BG <150,  4 units if BG 150-174,  8 units if  BG 175-199,  16 units if BG >200      . ipratropium-albuterol (DUONEB) 0.5-2.5 (3) MG/3ML SOLN Take 3 mLs by nebulization as needed (If severe asthma , use every 4 hours for 10 days).       Marland Kitchen loperamide (IMODIUM) 2 MG capsule Take 2 mg by mouth as needed for diarrhea or loose stools.      Marland Kitchen losartan (COZAAR) 100 MG tablet Take 100 mg by mouth 2 (two) times daily.       . Magnesium Oxide 250 MG TABS Take 250 mg by mouth 2 (two) times daily.       . metoprolol tartrate (LOPRESSOR) 25 MG tablet Take 12.5 mg by mouth 2 (two) times daily.       . Multiple Vitamin (MULTIVITAMIN) tablet Take 1 tablet by mouth daily.        . nitroGLYCERIN (NITROSTAT) 0.3 MG SL tablet Place 0.3 mg under the tongue every 5 (five) minutes x 3 doses as needed for chest pain.       Marland Kitchen omeprazole (PRILOSEC) 20 MG capsule Take 20 mg by mouth daily.       . ondansetron (ZOFRAN) 4 MG tablet Take 1 tablet (4 mg total) by mouth 2 (two) times daily as needed. For nausea  20 tablet  0  . ranolazine (RANEXA) 1000 MG SR tablet Take 1 tablet (1,000 mg total) by mouth 2 (two) times daily.  60 tablet  11  .  rosuvastatin (CRESTOR) 40 MG tablet Take 40 mg by mouth daily.       Marland Kitchen topiramate (TOPAMAX) 50 MG tablet Take 1 tablet (50 mg total) by mouth 2 (two) times daily.  60 tablet  6  . triamterene-hydrochlorothiazide (DYAZIDE) 37.5-25 MG per capsule Take 1 capsule by mouth daily as needed (For extra fluid).      . vitamin B-12 (CYANOCOBALAMIN) 1000 MCG tablet Take 1,000 mcg by mouth daily.      Marland Kitchen zinc gluconate 50 MG tablet Take 50 mg by mouth daily.      Marland Kitchen zolpidem (AMBIEN) 10 MG tablet Take 10 mg by mouth at bedtime as needed for sleep.        No current facility-administered medications for this visit.    Review of Systems : See HPI for pertinent positives and negatives.  Physical Examination  Filed Vitals:   10/29/13 1237 10/29/13 1238  BP: 138/66 120/62  Pulse: 52 42  Resp: 16   Height: 5\' 2"  (1.575 m)   Weight: 272 lb  (123.378 kg)    Body mass index is 49.74 kg/(m^2).  General: WDWN morbidly obese female in NAD GAIT: normal Eyes: PERRLA Pulmonary:  Non-labored, CTAB, Negative  Rales, Negative rhonchi, & Negative wheezing.  Cardiac: regular Rhythm ,  Negative detected murmur.  VASCULAR EXAM Carotid Bruits Right Left   Negative Negative    Radial pulses are 1+ palpable and equal.                                                                                                                            LE Pulses Right Left       POPLITEAL  not palpable   not palpable       POSTERIOR TIBIAL   palpable    palpable        DORSALIS PEDIS      ANTERIOR TIBIAL  palpable   palpable     Gastrointestinal: soft, nontender, BS WNL, no r/g,  negative masses.  Musculoskeletal: Negative muscle atrophy/wasting. M/S 4/5 throughout, Extremities without ischemic changes.  Neurologic: A&O X 3; Appropriate Affect ; SENSATION ;normal;  Speech is normal CN 2-12 intact, Pain and light touch intact in extremities, Motor exam as listed above.   Assessment: [name] [name] is a 68 y.o. female who is s/p left carotid stent placement on 03/20/2013 with distal embolic protection. This was done in the setting of a symptomatic left carotid stenosis. The patient was having nearly daily episodes of aphasia. She presents with residual neurological sequela from a stroke that she suffered, possibly in July, Dr. Jannifer Franklin is closely monitoring her. No significant stenosis of bilateral ICA's on 09/27/13 carotid Duplex requested and reviewed by Dr. Jannifer Franklin.   Plan: Follow-up in 6 months with Carotid Duplex scan.   I discussed in depth with the patient the nature of atherosclerosis, and emphasized the importance of maximal medical management including strict control of blood pressure, blood glucose, and  lipid levels, obtaining regular exercise, and continued cessation of smoking.  The patient is aware that without maximal medical  management the underlying atherosclerotic disease process will progress, limiting the benefit of any interventions. The patient was given information about stroke prevention and what symptoms should prompt the patient to seek immediate medical care. Thank you for allowing [place] to participate in this patient's care.  Clemon Chambers, RN, MSN, FNP-C [facility] Office: [phone]  Clinic Physician: Trula Slade  10/29/2013 1:02 PM

## 2013-10-29 NOTE — Patient Instructions (Signed)
Stroke Prevention Some medical conditions and behaviors are associated with an increased chance of having a stroke. You may prevent a stroke by making healthy choices and managing medical conditions. HOW CAN I REDUCE MY RISK OF HAVING A STROKE?   Stay physically active. Get at least 30 minutes of activity on most or all days.  Do not smoke. It may also be helpful to avoid exposure to secondhand smoke.  Limit alcohol use. Moderate alcohol use is considered to be:  No more than 2 drinks per day for men.  No more than 1 drink per day for nonpregnant women.  Eat healthy foods. This involves:  Eating 5 or more servings of fruits and vegetables a day.  Making dietary changes that address high blood pressure (hypertension), high cholesterol, diabetes, or obesity.  Manage your cholesterol levels.  Making food choices that are high in fiber and low in saturated fat, trans fat, and cholesterol may control cholesterol levels.  Take any prescribed medicines to control cholesterol as directed by your health care provider.  Manage your diabetes.  Controlling your carbohydrate and sugar intake is recommended to manage diabetes.  Take any prescribed medicines to control diabetes as directed by your health care provider.  Control your hypertension.  Making food choices that are low in salt (sodium), saturated fat, trans fat, and cholesterol is recommended to manage hypertension.  Take any prescribed medicines to control hypertension as directed by your health care provider.  Maintain a healthy weight.  Reducing calorie intake and making food choices that are low in sodium, saturated fat, trans fat, and cholesterol are recommended to manage weight.  Stop drug abuse.  Avoid taking birth control pills.  Talk to your health care provider about the risks of taking birth control pills if you are over 35 years old, smoke, get migraines, or have ever had a blood clot.  Get evaluated for sleep  disorders (sleep apnea).  Talk to your health care provider about getting a sleep evaluation if you snore a lot or have excessive sleepiness.  Take medicines only as directed by your health care provider.  For some people, aspirin or blood thinners (anticoagulants) are helpful in reducing the risk of forming abnormal blood clots that can lead to stroke. If you have the irregular heart rhythm of atrial fibrillation, you should be on a blood thinner unless there is a good reason you cannot take them.  Understand all your medicine instructions.  Make sure that other conditions (such as anemia or atherosclerosis) are addressed. SEEK IMMEDIATE MEDICAL CARE IF:   You have sudden weakness or numbness of the face, arm, or leg, especially on one side of the body.  Your face or eyelid droops to one side.  You have sudden confusion.  You have trouble speaking (aphasia) or understanding.  You have sudden trouble seeing in one or both eyes.  You have sudden trouble walking.  You have dizziness.  You have a loss of balance or coordination.  You have a sudden, severe headache with no known cause.  You have new chest pain or an irregular heartbeat. Any of these symptoms may represent a serious problem that is an emergency. Do not wait to see if the symptoms will go away. Get medical help at once. Call your local emergency services (911 in [place] Do not drive yourself to the hospital. Document Released: 02/26/2004 Document Revised: 06/04/2013 Document Reviewed: 07/21/2012 Exit[employer] Patient Information 2015 [employer]. This information is not intended to replace advice given   to you by your health care provider. Make sure you discuss any questions you have with your health care provider.  

## 2013-10-30 ENCOUNTER — Telehealth: Payer: Self-pay | Admitting: Surgery

## 2013-10-30 NOTE — Telephone Encounter (Signed)
Message copied by Rufina Falco on Tue Oct 30, 2013 10:29 AM ------      Message from: Viann Fish      Created: Mon Oct 29, 2013  7:30 PM      Regarding: follow up carotid Duplex       Please call pt and let her know that after reviewing the surveillance timeline for carotid stent placement, she needs to return in 6 months for follow up carotid Duplex, not a year.      Thank you,      Vinnie Level ------

## 2013-10-30 NOTE — Addendum Note (Signed)
Addended by: Mena Goes on: 10/30/2013 04:48 PM   Modules accepted: Orders

## 2013-10-30 NOTE — Telephone Encounter (Signed)
Advised patient of Suzanne's message.  Scheduled her follow-up appointment for 05/2014 and mailed her a reminder.

## 2013-10-31 ENCOUNTER — Encounter (HOSPITAL_COMMUNITY)
Admission: RE | Admit: 2013-10-31 | Discharge: 2013-10-31 | Disposition: A | Discharge status: Home / Self Care | Payer: Medicare Other | Source: Ambulatory Visit | Attending: Cardiology | Admitting: Cardiology

## 2013-10-31 DIAGNOSIS — Z5189 Encounter for other specified aftercare: Secondary | ICD-10-CM | POA: Diagnosis not present

## 2013-10-31 LAB — GLUCOSE, CAPILLARY: Glucose-Capillary: 161 mg/dL — ABNORMAL HIGH (ref 70–99)

## 2013-11-02 ENCOUNTER — Encounter (HOSPITAL_COMMUNITY)
Admission: RE | Admit: 2013-11-02 | Discharge: 2013-11-02 | Disposition: A | Discharge status: Home / Self Care | Payer: Medicare Other | Source: Ambulatory Visit | Attending: Cardiology | Admitting: Cardiology

## 2013-11-02 DIAGNOSIS — R079 Chest pain, unspecified: Secondary | ICD-10-CM | POA: Diagnosis not present

## 2013-11-02 DIAGNOSIS — I252 Old myocardial infarction: Secondary | ICD-10-CM | POA: Insufficient documentation

## 2013-11-02 DIAGNOSIS — Z5189 Encounter for other specified aftercare: Secondary | ICD-10-CM | POA: Insufficient documentation

## 2013-11-02 DIAGNOSIS — I200 Unstable angina: Secondary | ICD-10-CM | POA: Diagnosis not present

## 2013-11-02 DIAGNOSIS — I059 Rheumatic mitral valve disease, unspecified: Secondary | ICD-10-CM | POA: Insufficient documentation

## 2013-11-02 DIAGNOSIS — I6529 Occlusion and stenosis of unspecified carotid artery: Secondary | ICD-10-CM | POA: Diagnosis not present

## 2013-11-02 DIAGNOSIS — I2510 Atherosclerotic heart disease of native coronary artery without angina pectoris: Secondary | ICD-10-CM | POA: Diagnosis not present

## 2013-11-02 DIAGNOSIS — Z955 Presence of coronary angioplasty implant and graft: Secondary | ICD-10-CM | POA: Insufficient documentation

## 2013-11-02 DIAGNOSIS — I5032 Chronic diastolic (congestive) heart failure: Secondary | ICD-10-CM | POA: Insufficient documentation

## 2013-11-02 LAB — GLUCOSE, CAPILLARY: Glucose-Capillary: 164 mg/dL — ABNORMAL HIGH (ref 70–99)

## 2013-11-02 NOTE — Progress Notes (Addendum)
[employer] graduates today and plans to continue exercise at [facility]. PHQ score a 2. Fae still reports being depressed but is taking celexa and has been meeting with Jeanella Craze the hospital chaplain this has been helpful. Gerriann is going to pursue talking with a counselor in the future. I also encouraged Moana to attend the heart sisters support group that meets once a month. Kai reports feeling better in general since she has been participating in cardiac rehab.

## 2013-11-05 ENCOUNTER — Encounter: Payer: Self-pay | Admitting: Cardiology

## 2013-11-05 ENCOUNTER — Encounter: Payer: Self-pay | Admitting: *Deleted

## 2013-11-05 ENCOUNTER — Encounter (HOSPITAL_COMMUNITY): Payer: Medicare Other

## 2013-11-07 ENCOUNTER — Encounter (HOSPITAL_COMMUNITY): Payer: Medicare Other

## 2013-11-08 ENCOUNTER — Ambulatory Visit (INDEPENDENT_AMBULATORY_CARE_PROVIDER_SITE_OTHER): Payer: Medicare Other | Admitting: Cardiology

## 2013-11-08 ENCOUNTER — Encounter: Payer: Self-pay | Admitting: Cardiology

## 2013-11-08 VITALS — BP 138/76 | HR 61 | Ht 62.0 in | Wt 273.6 lb

## 2013-11-08 DIAGNOSIS — I5032 Chronic diastolic (congestive) heart failure: Secondary | ICD-10-CM

## 2013-11-08 DIAGNOSIS — I25810 Atherosclerosis of coronary artery bypass graft(s) without angina pectoris: Secondary | ICD-10-CM

## 2013-11-08 DIAGNOSIS — I10 Essential (primary) hypertension: Secondary | ICD-10-CM

## 2013-11-08 DIAGNOSIS — I252 Old myocardial infarction: Secondary | ICD-10-CM

## 2013-11-08 DIAGNOSIS — R0602 Shortness of breath: Secondary | ICD-10-CM

## 2013-11-08 DIAGNOSIS — I6350 Cerebral infarction due to unspecified occlusion or stenosis of unspecified cerebral artery: Secondary | ICD-10-CM

## 2013-11-08 DIAGNOSIS — I639 Cerebral infarction, unspecified: Secondary | ICD-10-CM

## 2013-11-08 DIAGNOSIS — I209 Angina pectoris, unspecified: Secondary | ICD-10-CM

## 2013-11-08 DIAGNOSIS — I2510 Atherosclerotic heart disease of native coronary artery without angina pectoris: Secondary | ICD-10-CM

## 2013-11-08 MED ORDER — PANTOPRAZOLE SODIUM 40 MG PO TBEC: 40.0000 mg | DELAYED_RELEASE_TABLET | Freq: Two times a day (BID) | ORAL | Status: DC

## 2013-11-08 NOTE — Patient Instructions (Signed)
Stop Omeprazole.   Start protonix 40mg  two times a day.  Your physician recommends that you have lab work today--BMET/BNP.  You have been referred to the [facility].  Your physician wants you to follow-up in: 3 months with Dr [name]. (January 2016).  You will receive a reminder letter in the mail two months in advance. If you don't receive a letter, please call our office to schedule the follow-up appointment.

## 2013-11-09 ENCOUNTER — Encounter (HOSPITAL_COMMUNITY): Payer: Medicare Other

## 2013-11-09 LAB — BASIC METABOLIC PANEL
BUN: 28 mg/dL — ABNORMAL HIGH (ref 6–23)
CO2: 31 mEq/L (ref 19–32)
Calcium: 9.3 mg/dL (ref 8.4–10.5)
Chloride: 103 mEq/L (ref 96–112)
Creatinine, Ser: 1.2 mg/dL (ref 0.4–1.2)
GFR: 50.19 mL/min — ABNORMAL LOW (ref >60.00)
Glucose, Bld: 158 mg/dL — ABNORMAL HIGH (ref 70–99)
Potassium: 4.6 mEq/L (ref 3.5–5.1)
Sodium: 138 mEq/L (ref 135–145)

## 2013-11-09 LAB — BRAIN NATRIURETIC PEPTIDE: Pro B Natriuretic peptide (BNP): 42 pg/mL (ref 0.0–100.0)

## 2013-11-10 NOTE — Progress Notes (Signed)
Patient ID: ASE WIITALA, female   DOB: [birth year], 68 y.o.   [name]: [id number] PCP: Dr. Sandi Mariscal  68 yo with history of CAD s/p CABG, diastolic CHF, and cerebrovascular disease with history of CVA presents for cardiology followup. She had CABG x 5 in 12/11.  Prior to the CABG she had multiple PCIs.  She had a CVA in 2/14 that presented as visual loss.  She ended up getting a cerebral angiogram in 6/14 with 70% ostial right vertebral stenosis, > 70% stenosis proximal left posterior cerebral artery, and 123456 LICA stenosis.  She has been on ASA and Plavix.  She recently saw Dr. Scot Dock, and carotid dopplers suggested worsening of LICA stenosis to > 123XX123.  She has had episodes of transient expressive aphasia for several months.  She is planned for a carotid stent soon.  Given dyspnea and chest pain that was worsened from her baseline, I took Mrs Doolin for left heart catheterization in 9/14.  This showed patent SVG-D, LIMA-LAD, and sequential SVG-OM branches but SVG-RCA and the native RCA were both totally occluded.  I suspect that her increased symptoms coincided with occlusion of SVG-RCA.  I started her on Imdur to see if this would help with the chest pain and dyspnea.  However, she feels like Imdur causes leg cramps and does not think that she can take it.  I next started her on ranolazine 500 mg bid and titrated this up to 1000 mg bid.  This helped some but not markedly.  Therefore, I had her see Dr. Irish Lack to address opening RCA CTO. He was able to do this in 5/15 with 4 overlapping Xience DES.    Mrs Mehrotra was initially on Effient s/p PCI, but this was changed to Brilinta given stroke history.  She became much more short of breath after starting on Brilinta.  I had her stop Brilinta at last appointment and replaced it with Plavix.  Given risk for interaction with Plavix, Prozac was stopped and her PCP began her on Effexor, but she thinks that his has not been as effective.  Since coming off Brilinta, she  has done much better.  She can walk around her house without dyspnea.  She is short of breath walking across a parking lot or up 1/2 flight of steps.  No orthopnea.  Since PCI, she is having less chest pain.  Now she has 1-2 episodes of nonexertional mild chest pain weekly.   In 7/15, she ha an episode ataxia/imbalance with ?CVA.  MRI 9/15 showed a prior CVA and 8/15 carotids showed no significant disease.  She saw a neurologist, therapy was not changed.   Labs (6/14): K 3.8, creatinine 0.71 Labs (8/14): BNP 249, LDL 166 Labs (9/14): K 4.7, creatinine 0.7 Labs (9/14): K 4.6, creatinine 0.9, BNP 109 Labs (1/15): K 4.5, creatinine 0.73, LDL 212, HCT 43.4, TSH normal, BNP 138 Labs (6/15): K 4.7, creatinine 1.17, LDL 100, HDL 37, TGs 363, BNP 39  ECG: NSR, LBBB  PMH: 1. Diabetic gastroparesis 2. Type II diabetes 3. HTN 4. Morbid obesity 5. CAD: s/p CABG in 12/11 after prior PCIs.  LIMA-LAD, SVG-D, seq SVG-OM1 and OM2, SVG-PDA. Adenosine Cardiolite (8/14) with EF 53% and a small reversible apical defect with a medium, partially reversible inferior defect.  LHC (9/14) with patent SVG-D, LIMA-LAD (50% distal LAD), and sequential SVG-OM branches; the SVG-RCA and the native RCA were occluded.  This was managed medically initially, but with ongoing exertional chest pain, it was decided to  open CTO.  Patient had CTO opening with 4 overlapping Xience DES in the RCA in 5/15.  .  6. Atypical migraines 7. OSA: Intolerant of CPAP.  8. GERD with hiatal hernia 9. OA 10. Diastolic CHF: Echo (A999333) with EF 50-55%, grade II diastolic dysfunction, mild-moderate MR.  Echo (8/14) with EF 55-60%, grade II diastolic dysfunction, mildly increased aortic valve gradient (mean 12 mmHg) but valve opens well, mild MR and mild RV dilation.  Echo (9/15) with EF 60-65%, grade II diastolic dysfunction, mild aortic stenosis, mild mitral stenosis, mild to moderate mitral regurgitation, mildly dilated RV with normal systolic  function, PA systolic pressure 46 mmHg.  11. CKD 12. Chronic LBBB 13. Anxiety 14. Carotid stenosis: Followed by [facility], [id number] LICA stenosis [id number].  She had left carotid stent in 2/15. Carotid dopplers 8/15 with no significant disease.  15. Cerebrovascular disease: CVA 2/14 with right posterior cerebral artery territory ischemic infarction. Cerebral angiogram in 6/14 showed 70% right vertebral ostial stenosis, 123456 LICA stenosis, > XX123456 proximal left posterior cerebral artery stenosis, posterior communicating artery aneurysm.  Patient has had episodes of transient expressive aphasia.  Carotid dopplers ([id number]) showed XX123456 LICA stenosis. She had a left carotid stent 2/15.  Possible CVA in 7/15.  16. Positional vertigo (suspected) 17. Palpitations: Holter (6/15) with rare PVCs/PACs.  18. Dyspnea with Brilinta.  19. Aortic stenosis: Mild.   SH: Married, nonsmoker  FH: CAD  ROS: All systems reviewed and negative except as per HPI.   Current Outpatient Prescriptions  Medication Sig Dispense Refill  . albuterol (PROVENTIL) (2.5 MG/3ML) 0.083% nebulizer solution Take 2.5 mg by nebulization every 6 (six) hours as needed for shortness of breath (when asthma is active).       Marland Kitchen albuterol (PROVENTIL,VENTOLIN) 90 MCG/ACT inhaler Inhale 2 puffs into the lungs every 6 (six) hours as needed for shortness of breath.       . ALPRAZolam (XANAX) 0.5 MG tablet Take 0.5 mg by mouth 2 (two) times daily as needed for anxiety. For anxiety - Patient takes 1 in the morning and 1 in the evening daily. If needed she may take one in the afternoon.      Marland Kitchen amLODipine (NORVASC) 10 MG tablet Take 1 tablet (10 mg total) by mouth daily.  30 tablet  6  . aspirin EC 81 MG tablet Take 81 mg by mouth daily.      . B Complex-C (B-COMPLEX WITH VITAMIN C) tablet Take 1 tablet by mouth daily.      . calcium citrate (CALCITRATE - DOSED IN MG ELEMENTAL CALCIUM) 950 MG tablet Take 200 mg of elemental calcium by mouth daily.      .  Cholecalciferol (VITAMIN D) 2000 UNITS tablet Take 2,000 Units by mouth daily.      . cholestyramine (QUESTRAN) 4 GM/DOSE powder Take 2 g by mouth 2 (two) times daily with a meal. (mixed in juice or light kool-aid).      . cloNIDine (CATAPRES) 0.2 MG tablet Take 0.2 mg by mouth 2 (two) times daily.      . clopidogrel (PLAVIX) 75 MG tablet TAKE 2 TABS TONIGHT (150 TOTAL), AND THEN 75 MG THEREAFTER  90 tablet  3  . DULoxetine (CYMBALTA) 20 MG capsule Take 20 mg by mouth 2 (two) times daily.      Marland Kitchen ezetimibe (ZETIA) 10 MG tablet Take 1 tablet (10 mg total) by mouth daily.  30 tablet  11  . Fluticasone-Salmeterol (ADVAIR DISKUS) 250-50 MCG/DOSE AEPB Inhale 1 puff  into the lungs every 12 (twelve) hours.       . furosemide (LASIX) 40 MG tablet Take 40 mg by mouth every morning.      . furosemide (LASIX) 40 MG tablet Take 20-40 mg by mouth every evening. Take 20mg  every evening but may take a whole tablet of 40mg  in the evening depending on swelling.      . gabapentin (NEURONTIN) 600 MG tablet Take 600 mg by mouth at bedtime.       Marland Kitchen HUMALOG KWIKPEN 100 UNIT/ML SOPN Inject 10 Units into the skin 2 (two) times daily with a meal. 5-10 units sliding scale      . hydrALAZINE (APRESOLINE) 50 MG tablet Take 50 mg by mouth 3 (three) times daily as needed (for high blood pressure). If BP is >175      . HYDROcodone-acetaminophen (NORCO/VICODIN) 5-325 MG per tablet Take 1 tablet by mouth every evening.       . insulin glargine (LANTUS) 100 UNIT/ML injection Inject 48 Units into the skin every morning.       . insulin glargine (LANTUS) 100 UNIT/ML injection Inject 4-16 Units into the skin at bedtime as needed (For blood sugar = or >150). Patient takes: 0 units if BG <150,  4 units if BG 150-174,  8 units if BG 175-199,  16 units if BG >200      . ipratropium-albuterol (DUONEB) 0.5-2.5 (3) MG/3ML SOLN Take 3 mLs by nebulization as needed (If severe asthma , use every 4 hours for 10 days).       Marland Kitchen loperamide  (IMODIUM) 2 MG capsule Take 2 mg by mouth as needed for diarrhea or loose stools.      Marland Kitchen losartan (COZAAR) 100 MG tablet Take 100 mg by mouth 2 (two) times daily.       . Magnesium Oxide 250 MG TABS Take 250 mg by mouth 2 (two) times daily.       . metoprolol tartrate (LOPRESSOR) 25 MG tablet Take 12.5 mg by mouth 2 (two) times daily.       . Multiple Vitamin (MULTIVITAMIN) tablet Take 1 tablet by mouth daily.        . nitroGLYCERIN (NITROSTAT) 0.3 MG SL tablet Place 0.3 mg under the tongue every 5 (five) minutes x 3 doses as needed for chest pain.       Marland Kitchen ondansetron (ZOFRAN) 4 MG tablet Take 1 tablet (4 mg total) by mouth 2 (two) times daily as needed. For nausea  20 tablet  0  . ranolazine (RANEXA) 1000 MG SR tablet Take 1 tablet (1,000 mg total) by mouth 2 (two) times daily.  60 tablet  11  . rosuvastatin (CRESTOR) 40 MG tablet Take 40 mg by mouth daily.       Marland Kitchen topiramate (TOPAMAX) 50 MG tablet Take 1 tablet (50 mg total) by mouth 2 (two) times daily.  60 tablet  6  . triamterene-hydrochlorothiazide (DYAZIDE) 37.5-25 MG per capsule Take 1 each (1 capsule total) by mouth daily.      . vitamin B-12 (CYANOCOBALAMIN) 1000 MCG tablet Take 1,000 mcg by mouth daily.      Marland Kitchen zinc gluconate 50 MG tablet Take 50 mg by mouth daily.      Marland Kitchen zolpidem (AMBIEN) 10 MG tablet Take 10 mg by mouth at bedtime as needed for sleep.       . pantoprazole (PROTONIX) 40 MG tablet Take 1 tablet (40 mg total) by mouth 2 (two) times daily.  [place]  tablet  3   No current facility-administered medications for this visit.    BP 138/76  Pulse 61  Ht 5\' 2"  (1.575 m)  Wt 273 lb 9.6 oz (124.104 kg)  BMI 50.03 kg/m2 General: NAD, obese Neck: No JVD, no thyromegaly or thyroid nodule.  Lungs: Occasional squeaks noted on lung exam.  CV: Nondisplaced PMI.  Heart regular S1/S2 with wide S2 split, no S3/S4, 1/6 early SEM.  1+ edema 1/2 up lower legs bilaterally.  No carotid bruit.  Normal pedal pulses.  Abdomen: Soft, nontender,  no hepatosplenomegaly, no distention.  Skin: Intact without lesions or rashes.  Neurologic: Alert and oriented x 3.  Psych: Normal affect. Extremities: No clubbing or cyanosis.   Assessment/Plan: 1. CAD: Occluded native RCA and SVG-RCA.  Now status post opening of CTO RCA with 4 overlapping Xience DES.  Occasional atypical chest pain now, improved. Dyspnea much improved with change from Brilinta to Plavix.  - Continue ASA 81, statin, losartan, Crestor, amlodipine, metoprolol, and ranolazine.  - She will continue Plavix long-term.  Prozac was stopped and she was put on Effexor to avoid interaction with Plavix.  The Effexor was not as effective.  I would probably avoid Prozac given potential interaction with Plavix and use another agent.  I will also change her omeprazole to Protonix given risk of interaction of omeprazole with Plavix.  2. Chronic diastolic CHF: She is not particularly volume overloaded on exam.  I will have her continue the current Lasix dose.  Suspect recent dyspnea worsening was due to Brilinta.  Now resolved.  BMET today.  3. Hyperlipidemia: Lipids better with addition of Zetia but LDL and TGs still higher than goal.  Will refer to lipid clinic (I tried to do this before but has not been scheduled yet).  She may be a candidate for a PCSK9-inhibitor. .  4. Hypertension: BP is improved.  Continue current regimen.  5. Cerebrovascular disease: She has history of CVA and had left carotid stent in 2/15. Followed by [facility]. Recent episode in 7/15 that may have been recurrent stroke.  She has seen neurology, no change to therapy.    Loralie Champagne 11/10/2013

## 2013-11-13 ENCOUNTER — Ambulatory Visit (INDEPENDENT_AMBULATORY_CARE_PROVIDER_SITE_OTHER): Payer: Medicare Other | Admitting: Pharmacist Clinician (PhC)/ Clinical Pharmacy Specialist

## 2013-11-13 ENCOUNTER — Encounter: Payer: Self-pay | Admitting: Pharmacist Clinician (PhC)/ Clinical Pharmacy Specialist

## 2013-11-13 DIAGNOSIS — E785 Hyperlipidemia, unspecified: Secondary | ICD-10-CM

## 2013-11-13 DIAGNOSIS — I209 Angina pectoris, unspecified: Secondary | ICD-10-CM

## 2013-11-13 NOTE — Assessment & Plan Note (Addendum)
Pt still not at LDL goal of <70 despite maximum doses of Crestor (40mg ) and Zetia (10mg ), as well as cholestyramine 2gm daily.  We will start her on Praluent 75mg  q2wks.  Pt already on insulin, has no concerns with additional injectable medication.  She is currently in the catastrophic phase of Medicare Part D.  Injection technique was taught and patient gave self first dose in office.  Paperwork to be signed by Dr. [name] tomorrow and faxed to [facility].  Will repeat lipid and hepatic panels in 3 months with follow up appointment in [facility] on January 12.

## 2013-11-13 NOTE — Patient Instructions (Signed)
Continue with Crestor 40mg , Zetia 10mg  and Questran powder daily  Next dose of Praluent will be due about October 27.  Continue to work on dietary issues - increase fiber and vegetables, watch fats

## 2013-11-13 NOTE — Progress Notes (Signed)
11/13/2013 MARDEAN [name] 10-08-1945 [id number]   HPI:  [name] [name] is a 68 y.o. female patient of Dr [name], who presents today for lipid clinic evaluation.  Ms Byl has a complex cardiac history that includes  CABG x 5 in Dec 2011, multiple PCIs prior to that and a CVA in Feb 2014.  She has been on ASA and clopidogrel, but recent carotid dopplers suggest worsening LICA stenosis to [id number].  Her most recent stenting in May of 2015 involved 4 overlapping stents in the RCA.  She had been on Effient, but given stroke history, was switched to [place], but this caused increased SOB.  She was then switched to clopidogrel.  In July 2015 she had episode of ataxia/imbalance, with questionable CVA.    Family history: father deceased MI at 58, PGF deceased MI at 34, MGF heart disease, multiple cousins with heart disease Social history: Denies alcohol use, denies tobacco use.  Exercise: recently finished Cardiac rehab, has just started at gym in [facility], will be working with staff to set up exercise program for her Diet: has spoken with dietary at Cardiac rehab several times,      RF: CHD, DM, HTN, age, family hx Meds:  Crestor 40 mg, Zetia 10 mg, Cholestyramine 2 gm   Intolerant:  None  Labs 11/2013 :  TC 185, HDL 53, LDL 95, TG 185 07/2013 :    TC 210, HDL 37, LDL 100, TG 363   Current Outpatient Prescriptions  Medication Sig Dispense Refill  . albuterol (PROVENTIL) (2.5 MG/3ML) 0.083% nebulizer solution Take 2.5 mg by nebulization every 6 (six) hours as needed for shortness of breath (when asthma is active).       Marland Kitchen albuterol (PROVENTIL,VENTOLIN) 90 MCG/ACT inhaler Inhale 2 puffs into the lungs every 6 (six) hours as needed for shortness of breath.       . ALPRAZolam (XANAX) 0.5 MG tablet Take 0.5 mg by mouth 2 (two) times daily as needed for anxiety. For anxiety - Patient takes 1 in the morning and 1 in the evening daily. If needed she may take one in the afternoon.      Marland Kitchen amLODipine  (NORVASC) 10 MG tablet Take 1 tablet (10 mg total) by mouth daily.  30 tablet  6  . aspirin EC 81 MG tablet Take 81 mg by mouth daily.      . B Complex-C (B-COMPLEX WITH VITAMIN C) tablet Take 1 tablet by mouth daily.      . calcium citrate (CALCITRATE - DOSED IN MG ELEMENTAL CALCIUM) 950 MG tablet Take 200 mg of elemental calcium by mouth daily.      . Cholecalciferol (VITAMIN D) 2000 UNITS tablet Take 2,000 Units by mouth daily.      . cholestyramine (QUESTRAN) 4 GM/DOSE powder Take 2 g by mouth 2 (two) times daily with a meal. (mixed in juice or light kool-aid).      . cloNIDine (CATAPRES) 0.2 MG tablet Take 0.2 mg by mouth 2 (two) times daily.      . clopidogrel (PLAVIX) 75 MG tablet TAKE 2 TABS TONIGHT (150 TOTAL), AND THEN 75 MG THEREAFTER  90 tablet  3  . DULoxetine (CYMBALTA) 20 MG capsule Take 20 mg by mouth 2 (two) times daily.      Marland Kitchen ezetimibe (ZETIA) 10 MG tablet Take 1 tablet (10 mg total) by mouth daily.  30 tablet  11  . Fluticasone-Salmeterol (ADVAIR DISKUS) 250-50 MCG/DOSE AEPB Inhale 1 puff into the  lungs every 12 (twelve) hours.       . furosemide (LASIX) 40 MG tablet Take 40 mg by mouth every morning.      . furosemide (LASIX) 40 MG tablet Take 20-40 mg by mouth every evening. Take 20mg  every evening but may take a whole tablet of 40mg  in the evening depending on swelling.      . gabapentin (NEURONTIN) 600 MG tablet Take 600 mg by mouth at bedtime.       Marland Kitchen HUMALOG KWIKPEN 100 UNIT/ML SOPN Inject 10 Units into the skin 2 (two) times daily with a meal. 5-10 units sliding scale      . hydrALAZINE (APRESOLINE) 50 MG tablet Take 50 mg by mouth 3 (three) times daily as needed (for high blood pressure). If BP is >175      . HYDROcodone-acetaminophen (NORCO/VICODIN) 5-325 MG per tablet Take 1 tablet by mouth every evening.       . insulin glargine (LANTUS) 100 UNIT/ML injection Inject 48 Units into the skin every morning.       . insulin glargine (LANTUS) 100 UNIT/ML injection Inject  4-16 Units into the skin at bedtime as needed (For blood sugar = or >150). Patient takes: 0 units if BG <150,  4 units if BG 150-174,  8 units if BG 175-199,  16 units if BG >200      . ipratropium-albuterol (DUONEB) 0.5-2.5 (3) MG/3ML SOLN Take 3 mLs by nebulization as needed (If severe asthma , use every 4 hours for 10 days).       Marland Kitchen loperamide (IMODIUM) 2 MG capsule Take 2 mg by mouth as needed for diarrhea or loose stools.      Marland Kitchen losartan (COZAAR) 100 MG tablet Take 100 mg by mouth 2 (two) times daily.       . Magnesium Oxide 250 MG TABS Take 250 mg by mouth 2 (two) times daily.       . metoprolol tartrate (LOPRESSOR) 25 MG tablet Take 12.5 mg by mouth 2 (two) times daily.       . Multiple Vitamin (MULTIVITAMIN) tablet Take 1 tablet by mouth daily.        . nitroGLYCERIN (NITROSTAT) 0.3 MG SL tablet Place 0.3 mg under the tongue every 5 (five) minutes x 3 doses as needed for chest pain.       Marland Kitchen ondansetron (ZOFRAN) 4 MG tablet Take 1 tablet (4 mg total) by mouth 2 (two) times daily as needed. For nausea  20 tablet  0  . pantoprazole (PROTONIX) 40 MG tablet Take 1 tablet (40 mg total) by mouth 2 (two) times daily.  180 tablet  3  . ranolazine (RANEXA) 1000 MG SR tablet Take 1 tablet (1,000 mg total) by mouth 2 (two) times daily.  60 tablet  11  . rosuvastatin (CRESTOR) 40 MG tablet Take 40 mg by mouth daily.       Marland Kitchen topiramate (TOPAMAX) 50 MG tablet Take 1 tablet (50 mg total) by mouth 2 (two) times daily.  60 tablet  6  . triamterene-hydrochlorothiazide (DYAZIDE) 37.5-25 MG per capsule Take 1 each (1 capsule total) by mouth daily.      . vitamin B-12 (CYANOCOBALAMIN) 1000 MCG tablet Take 1,000 mcg by mouth daily.      Marland Kitchen zinc gluconate 50 MG tablet Take 50 mg by mouth daily.      Marland Kitchen zolpidem (AMBIEN) 10 MG tablet Take 10 mg by mouth at bedtime as needed for sleep.  No current facility-administered medications for this visit.    Allergies  Allergen Reactions  . Amoxicillin Shortness  Of Breath and Rash  . Erythromycin Shortness Of Breath and Other (See Comments)    Trouble swallowing  . Penicillins Shortness Of Breath and Rash  . Metformin And Related     Stomach pain, cold sweats, joint pain, burred vision, dizziness  . Tape Other (See Comments)    Plastic tape causes skin to rip if left on for long periods of time  . Isosorbide Mononitrate [Isosorbide]     Joint aches, muscles hurt, difficult to walk    Past Medical History  Diagnosis Date  . Vomiting     persistent  . Asthma   . Ventral hernia     hx of  . GERD (gastroesophageal reflux disease)   . Obesity 01-2010  . Hyperlipidemia   . Hypertension   . Irregular heart beat   . Coronary artery disease   . Heart murmur   . Shortness of breath   . Anxiety   . PONV (postoperative nausea and vomiting)   . Peripheral vascular disease     ? numbness, tingling arms and legs  . Anemia     hx  . H/O hiatal hernia   . Neuromuscular disorder     ?  Marland Kitchen Dysrhythmia   . Mental disorder     hx of confusion  . Chronic kidney disease     frequency  . CHF (congestive heart failure)   . Anginal pain   . Myocardial infarction     04/1999, 02/2000, 01/2005; 2011; 2014  . Pneumonia 2000's    "once"  . Chronic bronchitis     "off and on all the time" (07/18/2013)  . Claustrophobia   . Type II diabetes mellitus   . Chronic lower back pain   . Other and unspecified angina pectoris   . Obstructive sleep apnea     "can't wear machine; I have claustrophobia" (07/18/2013)  . Migraine     "used to have them really bad; kind of eased up now; down to 1 q couple months" (07/18/2013)  . Headache     "probably 4 days/wk" (07/18/2013)  . Stroke 03/22/12    right side brain; denies residual on 07/18/2013)  . Osteoarthritis     "knees and hands" (07/18/2013)  . Bundle branch block, left     chronic/notes 07/18/2013    There were no vitals taken for this visit.   ASSESSMENT AND PLAN:  Tommy Medal PharmD CPP [facility]

## 2013-12-13 ENCOUNTER — Encounter: Payer: Self-pay | Admitting: *Deleted

## 2014-01-10 ENCOUNTER — Ambulatory Visit: Payer: Medicare Other | Admitting: Neurology

## 2014-01-10 ENCOUNTER — Encounter (HOSPITAL_COMMUNITY): Payer: Self-pay | Admitting: Vascular Surgery

## 2014-02-06 ENCOUNTER — Other Ambulatory Visit (INDEPENDENT_AMBULATORY_CARE_PROVIDER_SITE_OTHER): Payer: Medicare Other | Admitting: *Deleted

## 2014-02-06 DIAGNOSIS — E785 Hyperlipidemia, unspecified: Secondary | ICD-10-CM

## 2014-02-06 LAB — LDL CHOLESTEROL, DIRECT: Direct LDL: 156.7 mg/dL

## 2014-02-06 LAB — LIPID PANEL
Cholesterol: 239 mg/dL — ABNORMAL HIGH (ref 0–200)
HDL: 43 mg/dL (ref >39.00)
NonHDL: 196
Total CHOL/HDL Ratio: 6
Triglycerides: 209 mg/dL — ABNORMAL HIGH (ref 0.0–149.0)
VLDL: 41.8 mg/dL — ABNORMAL HIGH (ref 0.0–40.0)

## 2014-02-06 LAB — HEPATIC FUNCTION PANEL
ALT: 22 U/L (ref 0–35)
AST: 15 U/L (ref 0–37)
Albumin: 3.9 g/dL (ref 3.5–5.2)
Alkaline Phosphatase: 69 U/L (ref 39–117)
Bilirubin, Direct: 0 mg/dL (ref 0.0–0.3)
Total Bilirubin: 0.8 mg/dL (ref 0.2–1.2)
Total Protein: 6.7 g/dL (ref 6.0–8.3)

## 2014-02-12 ENCOUNTER — Encounter: Payer: Self-pay | Admitting: Internal Medicine

## 2014-02-12 ENCOUNTER — Ambulatory Visit (INDEPENDENT_AMBULATORY_CARE_PROVIDER_SITE_OTHER): Payer: Medicare Other | Admitting: Internal Medicine

## 2014-02-12 ENCOUNTER — Ambulatory Visit (INDEPENDENT_AMBULATORY_CARE_PROVIDER_SITE_OTHER): Payer: Medicare Other | Admitting: Pharmacist

## 2014-02-12 VITALS — BP 104/60 | HR 64 | Ht 62.0 in | Wt 275.0 lb

## 2014-02-12 VITALS — Wt 275.8 lb

## 2014-02-12 DIAGNOSIS — R110 Nausea: Secondary | ICD-10-CM

## 2014-02-12 DIAGNOSIS — E785 Hyperlipidemia, unspecified: Secondary | ICD-10-CM

## 2014-02-12 DIAGNOSIS — K920 Hematemesis: Secondary | ICD-10-CM | POA: Insufficient documentation

## 2014-02-12 DIAGNOSIS — Z9884 Bariatric surgery status: Secondary | ICD-10-CM

## 2014-02-12 DIAGNOSIS — Z7901 Long term (current) use of anticoagulants: Secondary | ICD-10-CM

## 2014-02-12 MED ORDER — SUCRALFATE 1 GM/10ML PO SUSP: 1.0000 g | Freq: Two times a day (BID) | ORAL | Status: DC

## 2014-02-12 MED ORDER — ONDANSETRON HCL 4 MG PO TABS: 4.0000 mg | ORAL_TABLET | Freq: Two times a day (BID) | ORAL | Status: DC | PRN

## 2014-02-12 NOTE — Patient Instructions (Signed)
It was a pleasure to meet you today.  -We will switch your Praluent to the pre-filled syringes -Begin injecting Praluent 75mg  subcutaneously every 2 weeks as you were before with the pens -Check fasting lipid panel and liver function in 3 months -Follow-up in lipid clinic one week after checking labs  Let us know if you have any troubles with the new pre-filled Praluent syringes or if you have any further questions. [id number]

## 2014-02-12 NOTE — Progress Notes (Addendum)
02/12/2014 [name] [name] 11-04-1945 [id number]  S/O: [name] is a 69yo female of Dr [name], who presents today for a 3 month follow-up after starting Praluent.  On 11/13/2013 she was started on Praluent 75mg  SubQ every 2 weeks using the PENS.  She reports her last injection on 02/06/2014, next dose due 02/20/2014.  [name] has a complex cardiac history that includes  CABG x 5 in Dec 2011, multiple PCIs prior to that and a CVA in Feb 2014.  She has been on ASA and clopidogrel, but recent carotid dopplers suggest worsening LICA stenosis to [id number].  Her most recent stenting in May of 2015 involved 4 overlapping stents in the RCA.  She had been on Effient, but given stroke history, was switched to [place], but this caused increased SOB.  She was then switched to clopidogrel.  In July 2015 she had episode of ataxia/imbalance, with questionable CVA.    Family history: father deceased MI at 69, PGF deceased MI at 23, MGF heart disease, multiple cousins with heart disease Social history: Denies alcohol use, denies tobacco use.  Exercise: Pt reports limited exercise currently d/t unstable gait 2/2 to previous stroke.  She reports that her gait is improving and she expects to increase exercise over the coming months. Diet: Pt has spoken with dietary at [facility]hab several times.  She primarily drinks diet flavored water, 1 coffee/day, and 1 diet soda/day    Breakfast - oatmeal, what toast w/ PB, fruit    Lunch - salad w/ non-fat dressing    Dinner - baked chicken or broiled fish 6x/week, lean red meat 1x/week, veggies    Snacks - denies snacking  RF: CHD, DM, HTN, age, family hx Meds:  Crestor 40 mg, Zetia 10 mg, Cholestyramine 2 gm   Intolerant:  None  Labs: 02/2014:    TC 239, HDL 43, LDL 156, TG 209 11/2013:  TC 185, HDL 53, LDL 95, TG 185 07/2013:    TC 210, HDL 37, LDL 100, TG 363   Current Outpatient Prescriptions  Medication Sig Dispense Refill  . albuterol (PROVENTIL) (2.5 MG/3ML)  0.083% nebulizer solution Take 2.5 mg by nebulization every 6 (six) hours as needed for shortness of breath (when asthma is active).     Marland Kitchen albuterol (PROVENTIL,VENTOLIN) 90 MCG/ACT inhaler Inhale 2 puffs into the lungs every 6 (six) hours as needed for shortness of breath.     . ALPRAZolam (XANAX) 0.5 MG tablet Take 0.5 mg by mouth 2 (two) times daily as needed for anxiety. For anxiety - Patient takes 1 in the morning and 1 in the evening daily. If needed she may take one in the afternoon.    Marland Kitchen amLODipine (NORVASC) 10 MG tablet Take 1 tablet (10 mg total) by mouth daily. 30 tablet 6  . aspirin EC 81 MG tablet Take 81 mg by mouth daily.    . B Complex-C (B-COMPLEX WITH VITAMIN C) tablet Take 1 tablet by mouth daily.    . calcium citrate (CALCITRATE - DOSED IN MG ELEMENTAL CALCIUM) 950 MG tablet Take 200 mg of elemental calcium by mouth daily.    . Cholecalciferol (VITAMIN D) 2000 UNITS tablet Take 2,000 Units by mouth daily.    . cholestyramine (QUESTRAN) 4 GM/DOSE powder Take 2 g by mouth 2 (two) times daily with a meal. (mixed in juice or light kool-aid).    . cloNIDine (CATAPRES) 0.2 MG tablet Take 0.2 mg by mouth 2 (two) times daily.    Marland Kitchen  clopidogrel (PLAVIX) 75 MG tablet TAKE 2 TABS TONIGHT (150 TOTAL), AND THEN 75 MG THEREAFTER 90 tablet 3  . DULoxetine (CYMBALTA) 20 MG capsule Take 20 mg by mouth 2 (two) times daily.    Marland Kitchen ezetimibe (ZETIA) 10 MG tablet Take 1 tablet (10 mg total) by mouth daily. 30 tablet 11  . Fluticasone-Salmeterol (ADVAIR DISKUS) 250-50 MCG/DOSE AEPB Inhale 1 puff into the lungs every 12 (twelve) hours.     . furosemide (LASIX) 40 MG tablet Take 40 mg by mouth every morning.    . gabapentin (NEURONTIN) 600 MG tablet Take 600 mg by mouth at bedtime.     Marland Kitchen HUMALOG KWIKPEN 100 UNIT/ML SOPN Inject 10 Units into the skin 2 (two) times daily with a meal. 5-10 units sliding scale    . hydrALAZINE (APRESOLINE) 50 MG tablet Take 50 mg by mouth 3 (three) times daily as needed (for  high blood pressure). If BP is >175    . HYDROcodone-acetaminophen (NORCO/VICODIN) 5-325 MG per tablet Take 1 tablet by mouth every evening.     . insulin glargine (LANTUS) 100 UNIT/ML injection Inject 48 Units into the skin every morning.     . insulin glargine (LANTUS) 100 UNIT/ML injection Inject 4-16 Units into the skin at bedtime as needed (For blood sugar = or >150). Patient takes: 0 units if BG <150,  4 units if BG 150-174,  8 units if BG 175-199,  16 units if BG >200    . ipratropium-albuterol (DUONEB) 0.5-2.5 (3) MG/3ML SOLN Take 3 mLs by nebulization as needed (If severe asthma , use every 4 hours for 10 days).     Marland Kitchen loperamide (IMODIUM) 2 MG capsule Take 2 mg by mouth as needed for diarrhea or loose stools.    Marland Kitchen losartan (COZAAR) 100 MG tablet Take 100 mg by mouth 2 (two) times daily.     . Magnesium Oxide 250 MG TABS Take 250 mg by mouth 2 (two) times daily.     . metoprolol tartrate (LOPRESSOR) 25 MG tablet Take 12.5 mg by mouth 2 (two) times daily.     . Multiple Vitamin (MULTIVITAMIN) tablet Take 1 tablet by mouth daily.      . nitroGLYCERIN (NITROSTAT) 0.3 MG SL tablet Place 0.3 mg under the tongue every 5 (five) minutes x 3 doses as needed for chest pain.     Marland Kitchen ondansetron (ZOFRAN) 4 MG tablet Take 1 tablet (4 mg total) by mouth 2 (two) times daily as needed. For nausea 20 tablet 0  . pantoprazole (PROTONIX) 40 MG tablet Take 1 tablet (40 mg total) by mouth 2 (two) times daily. 180 tablet 3  . ranolazine (RANEXA) 1000 MG SR tablet Take 1 tablet (1,000 mg total) by mouth 2 (two) times daily. 60 tablet 11  . rosuvastatin (CRESTOR) 40 MG tablet Take 40 mg by mouth daily.     . sucralfate (CARAFATE) 1 GM/10ML suspension Take 10 mLs (1 g total) by mouth 2 (two) times daily. 420 mL 1  . topiramate (TOPAMAX) 50 MG tablet Take 1 tablet (50 mg total) by mouth 2 (two) times daily. 60 tablet 6  . triamterene-hydrochlorothiazide (DYAZIDE) 37.5-25 MG per capsule Take 1 each (1 capsule total)  by mouth daily.    . vitamin B-12 (CYANOCOBALAMIN) 1000 MCG tablet Take 1,000 mcg by mouth daily.    Marland Kitchen zinc gluconate 50 MG tablet Take 50 mg by mouth daily.    Marland Kitchen zolpidem (AMBIEN) 10 MG tablet Take 10 mg by mouth  at bedtime as needed for sleep.      No current facility-administered medications for this visit.    Allergies  Allergen Reactions  . Amoxicillin Shortness Of Breath and Rash  . Brilinta [Ticagrelor] Shortness Of Breath  . Erythromycin Shortness Of Breath and Other (See Comments)    Trouble swallowing  . Penicillins Shortness Of Breath and Rash  . Metformin And Related     Stomach pain, cold sweats, joint pain, burred vision, dizziness  . Tape Other (See Comments)    Plastic tape causes skin to rip if left on for long periods of time  . Isosorbide Mononitrate [Isosorbide]     Joint aches, muscles hurt, difficult to walk    Past Medical History  Diagnosis Date  . Vomiting     persistent  . Asthma   . Ventral hernia     hx of  . GERD (gastroesophageal reflux disease)   . Obesity 01-2010  . Hyperlipidemia   . Hypertension   . Irregular heart beat   . Coronary artery disease   . Heart murmur   . Shortness of breath   . Anxiety   . PONV (postoperative nausea and vomiting)   . Peripheral vascular disease     ? numbness, tingling arms and legs  . Anemia     hx  . H/O hiatal hernia   . Neuromuscular disorder     ?  Marland Kitchen Dysrhythmia   . Mental disorder     hx of confusion  . Chronic kidney disease     frequency  . CHF (congestive heart failure)   . Anginal pain   . Myocardial infarction     04/1999, 02/2000, 01/2005; 2011; 2014  . Pneumonia 2000's    "once"  . Chronic bronchitis     "off and on all the time" (07/18/2013)  . Claustrophobia   . Type II diabetes mellitus   . Chronic lower back pain   . Other and unspecified angina pectoris   . Obstructive sleep apnea     "can't wear machine; I have claustrophobia" (07/18/2013)  . Migraine     "used to have  them really bad; kind of eased up now; down to 1 q couple months" (07/18/2013)  . Headache     "probably 4 days/wk" (07/18/2013)  . Stroke 03/22/12    right side brain; denies residual on 07/18/2013)  . Osteoarthritis     "knees and hands" (07/18/2013)  . Bundle branch block, left     chronic/notes 07/18/2013  . Vomiting blood     Weight 275 lb 12.8 oz (125.102 kg).  A/P: Ms. Gadway has not had any significant reduction in LDL since she initiated Praluent 75mg  SubQ every 2 weeks.  In fact, her lipid panel has gotten significantly worse.  Questions about compliance vs product being shipped to home and proper storage vs injection technique.  I assessed pt injection technique today in office and have established that pt has effectively NOT [place] medication d/t IMPROPER INJECTION TECHNIQUE.  -Will switch pt to Praluent 75mg  SubQ every 2 weeks using the pre-filled SYRINGES -Will contact manufacturer about product change and set up nurse visit for training and observation for 1st in-home injection -F/U FLP and LFTs in 60mo -F/U in lipid clinic 1 week after that -Future visits: assess injection technique if lipids still elevated, increase in exercise, may increase to Praluent 150mg  every 2 weeks if necessary  Drucie Opitz, PharmD Clinical Pharmacy Resident  Pager: [phone] 02/12/2014 2:36 PM

## 2014-02-12 NOTE — Progress Notes (Signed)
[name] [name] 02-Apr-1945 [id number]  Note: This dictation was prepared with Dragon digital system. Any transcriptional errors that result from this procedure are unintentional.   History of Present Illness: This is a 69 year old white female with severe heart disease who has been on long-term anticoagulation and has been having episodes of bright red hematemesis. The episodes have become more frequent . She has soreness and pain substernally following the  the episode of vomiting. Patient had   banded gastroplasty for obesity in 1980 and has had gastroesophageal reflux. She has had multiple endoscopies which  confirm 4 cm gastric pouch proximal to the gastroplasty with retained bile. Last upper endoscopy in July 2013 by Dr. [name] showed  post gastric banding.anatomy. Gastric  emptying scan in December 2005 confirmed gastroparesis, 57% retain food in 60 minutes and 32% retain in 120 minutes. Upper endoscopy in 2009 showed reflux esophagitis. She had a coronary artery bypass graft in December 2013. She has diastolic  heart failure. She had a CVA in February 2014 and again in October 2015 , now on aspirin and Plavix.by Dr Floyde Parkins, and Dr Andrena Mews. She has a 80% stenosis of left internal carotid artery    Past Medical History  Diagnosis Date  . Vomiting     persistent  . Asthma   . Ventral hernia     hx of  . GERD (gastroesophageal reflux disease)   . Obesity 01-2010  . Hyperlipidemia   . Hypertension   . Irregular heart beat   . Coronary artery disease   . Heart murmur   . Shortness of breath   . Anxiety   . PONV (postoperative nausea and vomiting)   . Peripheral vascular disease     ? numbness, tingling arms and legs  . Anemia     hx  . H/O hiatal hernia   . Neuromuscular disorder     ?  Marland Kitchen Dysrhythmia   . Mental disorder     hx of confusion  . Chronic kidney disease     frequency  . CHF (congestive heart failure)   . Anginal pain   . Myocardial infarction    04/1999, 02/2000, 01/2005; 2011; 2014  . Pneumonia 2000's    "once"  . Chronic bronchitis     "off and on all the time" (07/18/2013)  . Claustrophobia   . Type II diabetes mellitus   . Chronic lower back pain   . Other and unspecified angina pectoris   . Obstructive sleep apnea     "can't wear machine; I have claustrophobia" (07/18/2013)  . Migraine     "used to have them really bad; kind of eased up now; down to 1 q couple months" (07/18/2013)  . Headache     "probably 4 days/wk" (07/18/2013)  . Stroke 03/22/12    right side brain; denies residual on 07/18/2013)  . Osteoarthritis     "knees and hands" (07/18/2013)  . Bundle branch block, left     chronic/notes 07/18/2013  . Vomiting blood     Past Surgical History  Procedure Laterality Date  . Tumor removal  1970's    ovarian  . Tibia fracture surgery Right 1970's    rods and pins  . Gastric bypass  1984    "stapeling"  . Total abdominal hysterectomy  1970's    w/ appendectomy  . Ankle fracture surgery Left 1970's  . Fracture surgery    . Esophagogastroduodenoscopy  08/03/2011    Procedure: ESOPHAGOGASTRODUODENOSCOPY (EGD);  Surgeon: [name]  Bary Leriche, MD;  Location: [facility];  Service: General;  Laterality: N/A;  . Gall stone removal  05/2003  . Root canal  10/2000  . Coronary artery bypass graft  1220/11    "CABG X5"  . Appendectomy  1970's    w/hysterectomy  . Cholecystectomy  ?1980's  . Coronary angioplasty with stent placement  01,02,05,06,07,08,11; 04/24/2013    "I've probably got ~ 10 stents by now" (04/24/2013)  . Coronary angioplasty with stent placement  06/13/2013    "got 4 stents today" (06/13/2013)  . Cardiac catheterization  10/10/2012    Dr [name].  . Carotid endarterectomy Left 03/2013  . Hernia repair  ?78's X2    "in my stomach; had OR on it twice"  . Cerebral angiogram N/A 04/05/2011    Procedure: CEREBRAL ANGIOGRAM;  Surgeon: Angelia Mould, MD;  Location: [facility] CATH LAB;  Service: Cardiovascular;   Laterality: N/A;  . Carotid stent insertion Left 03/20/2013    Procedure: CAROTID STENT INSERTION;  Surgeon: [name], MD;  Location[facility] CATH LAB;  Service: Cardiovascular;  Laterality: Left;  internal carotid  . Percutaneous stent intervention N/A 04/24/2013    Procedure: PERCUTANEOUS STENT INTERVENTION;  Surgeon: [name], MD;  Location: [facility] CATH LAB;  Service: Cardiovascular;  Laterality: N/A;  . Percutaneous coronary stent intervention (pci-s) N/A 06/13/2013    Procedure: PERCUTANEOUS CORONARY STENT INTERVENTION (PCI-S);  Surgeon: [name], MD;  Location: [facility] CATH LAB;  Service: Cardiovascular;  Laterality: N/A;    Allergies  Allergen Reactions  . Amoxicillin Shortness Of Breath and Rash  . Brilinta [Ticagrelor] Shortness Of Breath  . Erythromycin Shortness Of Breath and Other (See Comments)    Trouble swallowing  . Penicillins Shortness Of Breath and Rash  . Metformin And Related     Stomach pain, cold sweats, joint pain, burred vision, dizziness  . Tape Other (See Comments)    Plastic tape causes skin to rip if left on for long periods of time  . Isosorbide Mononitrate [Isosorbide]     Joint aches, muscles hurt, difficult to walk    Family history and social history have been reviewed.  Review of Systems: Negative for dysphagia. Positive for nausea vomiting and hematemesis. Positive for morbid obesity  The remainder of the 10 point ROS is negative except as outlined in the H&P  Physical Exam: General Appearance Well developed, in no distress, morbidly obese Eyes  Non icteric  HEENT  Non traumatic, normocephalic  Mouth No lesion, tongue papillated, no cheilosis Neck Supple without adenopathy, thyroid not enlarged, no carotid bruits, no JVD Lungs Clear to auscultation bilaterally, post thoracotomy scar COR Normal S1, normal S2, regular rhythm, no murmur, quiet precordium Abdomen massively obese. Tender in epigastrium. No ascites. Lower abdomen  unremarkable Rectal soft Hemoccult negative stool Extremities  No pedal edema Skin No lesions Neurological Alert and oriented x 3 Psychological Normal mood and affect  Assessment and Plan:   69 year old white female with severe heart disease and chronic long-term anticoagulation who is having low volume hematemesis. She has a history of banded gastroplasty in 1980 and a small 4 cm gastric pouch. She most likely is having  gastritis in the pouch or Mallory Weiss tear or reflux esophagitis. She is already on Protonix 40 mg twice a day and we will add Carafate slurry 10 cc by mouth twice a day. She will continue strict antireflux measures and gastroparesis diet .She will be scheduled for upper endoscopy but  will not hold  anticoagulants because of high risk for stroke. Because of  high BMI procedure  will be done in an hospital setting with propofol   Morbid obesity. Status post banded gastroplasty in 1980s. Clearly the gastroplasty has not helped.Stapling created too large opening so she can essentially eat whatever she wants  Severe heart disease. Congestive heart failure. Followed by Dr. [name]. She has an appointment tomorrow.       Delfin Edis 02/12/2014

## 2014-02-12 NOTE — Patient Instructions (Addendum)
STAY ON PLAVIX PER DR BRODIE  You have been scheduled for an endoscopy. Please follow written instructions given to you at your visit today. If you use inhalers (even only as needed), please bring them with you on the day of your procedure. Your physician has requested that you go to www.startemmi.com and enter the access code given to you at your visit today. This web site gives a general overview about your procedure. However, you should still follow specific instructions given to you by our office regarding your preparation for the procedure.   Your prescriptions have been sent to your pharmacy  Dr Sandi Mariscal, Dr Deatra Robinson

## 2014-02-13 ENCOUNTER — Ambulatory Visit (INDEPENDENT_AMBULATORY_CARE_PROVIDER_SITE_OTHER): Payer: Medicare Other | Admitting: Cardiology

## 2014-02-13 ENCOUNTER — Encounter: Payer: Self-pay | Admitting: Cardiology

## 2014-02-13 VITALS — BP 130/72 | HR 86 | Ht 62.0 in | Wt 275.1 lb

## 2014-02-13 DIAGNOSIS — R0602 Shortness of breath: Secondary | ICD-10-CM

## 2014-02-13 DIAGNOSIS — I5032 Chronic diastolic (congestive) heart failure: Secondary | ICD-10-CM

## 2014-02-13 DIAGNOSIS — I25118 Atherosclerotic heart disease of native coronary artery with other forms of angina pectoris: Secondary | ICD-10-CM

## 2014-02-13 DIAGNOSIS — I10 Essential (primary) hypertension: Secondary | ICD-10-CM

## 2014-02-13 DIAGNOSIS — E785 Hyperlipidemia, unspecified: Secondary | ICD-10-CM

## 2014-02-13 DIAGNOSIS — Z9884 Bariatric surgery status: Secondary | ICD-10-CM

## 2014-02-13 LAB — CBC WITH DIFFERENTIAL/PLATELET
Basophils Absolute: 0.1 10*3/uL (ref 0.0–0.1)
Basophils Relative: 0.7 % (ref 0.0–3.0)
Eosinophils Absolute: 0.3 10*3/uL (ref 0.0–0.7)
Eosinophils Relative: 3.1 % (ref 0.0–5.0)
HCT: 44.5 % (ref 36.0–46.0)
Hemoglobin: 14.6 g/dL (ref 12.0–15.0)
Lymphocytes Relative: 24.1 % (ref 12.0–46.0)
Lymphs Abs: 2 10*3/uL (ref 0.7–4.0)
MCHC: 32.7 g/dL (ref 30.0–36.0)
MCV: 95.3 fl (ref 78.0–100.0)
Monocytes Absolute: 0.6 10*3/uL (ref 0.1–1.0)
Monocytes Relative: 7 % (ref 3.0–12.0)
Neutro Abs: 5.4 10*3/uL (ref 1.4–7.7)
Neutrophils Relative %: 65.1 % (ref 43.0–77.0)
Platelets: 244 10*3/uL (ref 150.0–400.0)
RBC: 4.68 Mil/uL (ref 3.87–5.11)
RDW: 12.6 % (ref 11.5–15.5)
WBC: 8.3 10*3/uL (ref 4.0–10.5)

## 2014-02-13 LAB — BASIC METABOLIC PANEL
BUN: 32 mg/dL — ABNORMAL HIGH (ref 6–23)
CO2: 32 mEq/L (ref 19–32)
Calcium: 9.5 mg/dL (ref 8.4–10.5)
Chloride: 100 mEq/L (ref 96–112)
Creatinine, Ser: 1.09 mg/dL (ref 0.40–1.20)
GFR: 53.35 mL/min — ABNORMAL LOW (ref >60.00)
Glucose, Bld: 209 mg/dL — ABNORMAL HIGH (ref 70–99)
Potassium: 4.9 mEq/L (ref 3.5–5.1)
Sodium: 138 mEq/L (ref 135–145)

## 2014-02-13 LAB — BRAIN NATRIURETIC PEPTIDE: Pro B Natriuretic peptide (BNP): 45 pg/mL (ref 0.0–100.0)

## 2014-02-13 MED ORDER — METOPROLOL TARTRATE 25 MG PO TABS: 25.0000 mg | ORAL_TABLET | Freq: Two times a day (BID) | ORAL | Status: DC

## 2014-02-13 MED ORDER — FUROSEMIDE 40 MG PO TABS: ORAL_TABLET | ORAL | Status: DC

## 2014-02-13 MED ORDER — POTASSIUM CHLORIDE CRYS ER 20 MEQ PO TBCR: 20.0000 meq | EXTENDED_RELEASE_TABLET | Freq: Every day | ORAL | Status: DC

## 2014-02-13 NOTE — Patient Instructions (Addendum)
Increase metoprolol to 25mg  two times a day.   Increase lasix (furosemide) to 40mg  in the AM and 20mg  about 4 PM.   Start KCL (20 mEq) daily.   Your physician recommends that you have  lab work today--BMET/CBCd/BNP.  Your physician recommends that you return for lab work in: 1 days--BMET. Monday January 25,2015--the lab is open from 7:30AM -5PM.   Your physician recommends that you schedule a follow-up appointment in: 1 month with Dr [name].

## 2014-02-14 ENCOUNTER — Encounter: Payer: Self-pay | Admitting: Cardiology

## 2014-02-14 NOTE — Progress Notes (Signed)
Patient ID: [name] [name], female   DOB: 24-Aug-1945, 69 y.o.   [name]: [id number] PCP: Dr. Sandi Mariscal  69 yo with history of CAD s/p CABG, diastolic CHF, and cerebrovascular disease with history of CVA presents for cardiology followup. She had CABG x 5 in 12/11.  Prior to the CABG she had multiple PCIs.  She had a CVA in 2/14 that presented as visual loss.  She ended up getting a cerebral angiogram in 6/14 with 70% ostial right vertebral stenosis, > 70% stenosis proximal left posterior cerebral artery, and 123456 LICA stenosis.  She has been on ASA and Plavix.  She recently saw Dr. Scot Dock, and carotid dopplers suggested worsening of LICA stenosis to > [id number].  She has had episodes of transient expressive aphasia for several months.  She is planned for a carotid stent soon.  Given dyspnea and chest pain that was worsened from her baseline, I took [name] for left heart catheterization in 9/14.  This showed patent SVG-D, LIMA-LAD, and sequential SVG-OM branches but SVG-RCA and the native RCA were both totally occluded.  I suspect that her increased symptoms coincided with occlusion of SVG-RCA.  I started her on Imdur to see if this would help with the chest pain and dyspnea.  However, she feels like Imdur causes leg cramps and does not think that she can take it.  I next started her on ranolazine 500 mg bid and titrated this up to 1000 mg bid.  This helped some but not markedly.  Therefore, I had her see Dr. Irish Lack to address opening RCA CTO. He was able to do this in 5/15 with 4 overlapping Xience DES.  This led to resolution of exertional chest pain.   Mrs Suleman was initially on Effient s/p PCI, but this was changed to Brilinta given stroke history.  She became much more short of breath after starting on Brilinta.  I had her stop Brilinta and replaced it with Plavix.  Given risk for interaction with Plavix, Prozac was stopped and her PCP began her on Effexor.  Dyspnea improved off Brilinta.   More recently,  patient has begun to get exertional chest tightness again.  She will note mild dyspnea and chest tightness walking up a flight of steps or walking long distances.  This is still better than before her 5/15 PCI but is worse than 6 months ago. No symptoms at rest or with minimal exertion.  LDL is very high but she was not properly injecting her Praluent.  Weight up 2 lbs.   Since 11/15, patient has been having small volume hematemesis.  Possible gastritis or ulcer bleeding. Dr. Olevia Perches will be doing an EGD on Plavix and ASA.  She is now on Protonix bid and Carafate.   Labs (6/14): K 3.8, creatinine 0.71 Labs (8/14): BNP 249, LDL 166 Labs (9/14): K 4.7, creatinine 0.7 Labs (9/14): K 4.6, creatinine 0.9, BNP 109 Labs (1/15): K 4.5, creatinine 0.73, LDL 212, HCT 43.4, TSH normal, BNP 138 Labs (6/15): K 4.7, creatinine 1.17, LDL 100, HDL 37, TGs 363, BNP 39 Labs (10/15): K 4.6, creatinine 1.2 Labs (1/16): LDL 157, HDL 43  ECG: NSR, LBBB  PMH: 1. Diabetic gastroparesis 2. Type II diabetes 3. HTN 4. Morbid obesity 5. CAD: s/p CABG in 12/11 after prior PCIs.  LIMA-LAD, SVG-D, seq SVG-OM1 and OM2, SVG-PDA. Adenosine Cardiolite (8/14) with EF 53% and a small reversible apical defect with a medium, partially reversible inferior defect.  LHC (9/14) with patent SVG-D, LIMA-LAD (50%  distal LAD), and sequential SVG-OM branches; the SVG-RCA and the native RCA were occluded.  This was managed medically initially, but with ongoing exertional chest pain, it was decided to open CTO.  Patient had CTO opening with 4 overlapping Xience DES in the RCA in 5/15.  .  6. Atypical migraines 7. OSA: Intolerant of CPAP.  8. GERD with hiatal hernia 9. OA 10. Diastolic CHF: Echo (A999333) with EF 50-55%, grade II diastolic dysfunction, mild-moderate MR.  Echo (8/14) with EF 55-60%, grade II diastolic dysfunction, mildly increased aortic valve gradient (mean 12 mmHg) but valve opens well, mild MR and mild RV dilation.  Echo  (9/15) with EF 60-65%, grade II diastolic dysfunction, mild aortic stenosis, mild mitral stenosis, mild to moderate mitral regurgitation, mildly dilated RV with normal systolic function, PA systolic pressure 46 mmHg.  11. CKD 12. Chronic LBBB 13. Anxiety 14. Carotid stenosis: Followed by [facility], [id number] LICA stenosis [id number].  She had left carotid stent in 2/15. Carotid dopplers 8/15 with no significant disease.  15. Cerebrovascular disease: CVA 2/14 with right posterior cerebral artery territory ischemic infarction. Cerebral angiogram in 6/14 showed 70% right vertebral ostial stenosis, 123456 LICA stenosis, > [id number] proximal left posterior cerebral artery stenosis, posterior communicating artery aneurysm.  Patient has had episodes of transient expressive aphasia.  Carotid dopplers ([id number]) showed XX123456 LICA stenosis. She had a left carotid stent 2/15.  Possible CVA in 7/15.  16. Positional vertigo (suspected) 17. Palpitations: Holter (6/15) with rare PVCs/PACs.  18. Dyspnea with Brilinta.  19. Aortic stenosis: Mild.   SH: Married, nonsmoker  FH: CAD  ROS: All systems reviewed and negative except as per HPI.   Current Outpatient Prescriptions  Medication Sig Dispense Refill  . albuterol (PROVENTIL) (2.5 MG/3ML) 0.083% nebulizer solution Take 2.5 mg by nebulization every 6 (six) hours as needed for shortness of breath (when asthma is active).     Marland Kitchen albuterol (PROVENTIL,VENTOLIN) 90 MCG/ACT inhaler Inhale 2 puffs into the lungs every 6 (six) hours as needed for shortness of breath.     . Alirocumab (PRALUENT) 75 MG/ML SOSY Inject 75 mg into the skin every 14 (fourteen) days.    . ALPRAZolam (XANAX) 0.5 MG tablet Take 0.5 mg by mouth 4 (four) times daily. For anxiety - Patient takes 1 in the morning and 1 in the evening daily. If needed she may take one in the afternoon.    Marland Kitchen amLODipine (NORVASC) 10 MG tablet Take 1 tablet (10 mg total) by mouth daily. 30 tablet 6  . aspirin EC 81 MG tablet Take 81 mg by  mouth daily.    . B Complex-C (B-COMPLEX WITH VITAMIN C) tablet Take 1 tablet by mouth daily.    . calcium citrate (CALCITRATE - DOSED IN MG ELEMENTAL CALCIUM) 950 MG tablet Take 200 mg of elemental calcium by mouth daily.    . Cholecalciferol (VITAMIN D) 2000 UNITS tablet Take 2,000 Units by mouth daily.    . cholestyramine (QUESTRAN) 4 GM/DOSE powder Take 2 g by mouth 2 (two) times daily with a meal. (mixed in juice or light kool-aid).    . cloNIDine (CATAPRES) 0.2 MG tablet Take 0.2 mg by mouth 2 (two) times daily.    . clopidogrel (PLAVIX) 75 MG tablet TAKE 2 TABS TONIGHT (150 TOTAL), AND THEN 75 MG THEREAFTER 90 tablet 3  . DULoxetine (CYMBALTA) 20 MG capsule Take 40 mg by mouth 2 (two) times daily.     Marland Kitchen ezetimibe (ZETIA) 10 MG tablet Take 1 tablet (  10 mg total) by mouth daily. 30 tablet 11  . Fluticasone-Salmeterol (ADVAIR DISKUS) 250-50 MCG/DOSE AEPB Inhale 1 puff into the lungs every 12 (twelve) hours.     . gabapentin (NEURONTIN) 600 MG tablet Take 600 mg by mouth 3 (three) times daily. 1 tablet AM, 1 tablet at noon and 1 1/2 tablet at bedtime.    Marland Kitchen HUMALOG KWIKPEN 100 UNIT/ML SOPN Inject 10 Units into the skin 2 (two) times daily with a meal. 5-10 units sliding scale    . hydrALAZINE (APRESOLINE) 50 MG tablet Take 50 mg by mouth 3 (three) times daily as needed (for high blood pressure). If BP is >175    . HYDROcodone-acetaminophen (NORCO/VICODIN) 5-325 MG per tablet Take 1 tablet by mouth every evening.     . insulin glargine (LANTUS) 100 UNIT/ML injection Inject 48 Units into the skin every morning.     . insulin glargine (LANTUS) 100 UNIT/ML injection Inject 4-16 Units into the skin at bedtime as needed (For blood sugar = or >150). Patient takes: 0 units if BG <150,  4 units if BG 150-174,  8 units if BG 175-199,  16 units if BG >200    . ipratropium-albuterol (DUONEB) 0.5-2.5 (3) MG/3ML SOLN Take 3 mLs by nebulization as needed (If severe asthma , use every 4 hours for 10 days).      Marland Kitchen loperamide (IMODIUM) 2 MG capsule Take 2 mg by mouth as needed for diarrhea or loose stools.    Marland Kitchen losartan (COZAAR) 100 MG tablet Take 100 mg by mouth daily.     . Magnesium Oxide 250 MG TABS Take 250 mg by mouth 2 (two) times daily.     . Multiple Vitamin (MULTIVITAMIN) tablet Take 1 tablet by mouth daily.      . nitroGLYCERIN (NITROSTAT) 0.3 MG SL tablet Place 0.3 mg under the tongue every 5 (five) minutes x 3 doses as needed for chest pain.     Marland Kitchen ondansetron (ZOFRAN) 4 MG tablet Take 1 tablet (4 mg total) by mouth 2 (two) times daily as needed. For nausea 20 tablet 0  . pantoprazole (PROTONIX) 40 MG tablet Take 1 tablet (40 mg total) by mouth 2 (two) times daily. 180 tablet 3  . ranolazine (RANEXA) 1000 MG SR tablet Take 1 tablet (1,000 mg total) by mouth 2 (two) times daily. 60 tablet 11  . rosuvastatin (CRESTOR) 40 MG tablet Take 40 mg by mouth daily.     . sucralfate (CARAFATE) 1 GM/10ML suspension Take 10 mLs (1 g total) by mouth 2 (two) times daily. 420 mL 1  . topiramate (TOPAMAX) 50 MG tablet Take 1 tablet (50 mg total) by mouth 2 (two) times daily. 60 tablet 6  . triamterene-hydrochlorothiazide (DYAZIDE) 37.5-25 MG per capsule Take 1 each (1 capsule total) by mouth daily.    . vitamin B-12 (CYANOCOBALAMIN) 1000 MCG tablet Take 1,000 mcg by mouth daily.    Marland Kitchen zinc gluconate 50 MG tablet Take 50 mg by mouth daily.    Marland Kitchen zolpidem (AMBIEN) 10 MG tablet Take 10 mg by mouth at bedtime as needed for sleep.     . furosemide (LASIX) 40 MG tablet 1 tablet (40mg ) by mouth in the AM and 1/2 tablet (20mg ) by mouth in the PM 135 tablet 3  . metoprolol tartrate (LOPRESSOR) 25 MG tablet Take 1 tablet (25 mg total) by mouth 2 (two) times daily. 180 tablet 3  . potassium chloride SA (K-DUR,KLOR-CON) 20 MEQ tablet Take 1 tablet (20 mEq  total) by mouth daily. 90 tablet 3   No current facility-administered medications for this visit.    BP 130/72 mmHg  Pulse 86  Ht 5\' 2"  (1.575 m)  Wt 275 lb 1.9 oz  (124.794 kg)  BMI 50.31 kg/m2 General: NAD, obese Neck: JVP 8 cm with HJR, no thyromegaly or thyroid nodule.  Lungs: Occasional squeaks noted on lung exam.  CV: Nondisplaced PMI.  Heart regular S1/S2 with wide S2 split, no S3/S4, 1/6 early SEM.  1+ edema 1/3 up lower legs bilaterally.  No carotid bruit.  Normal pedal pulses.  Abdomen: Soft, nontender, no hepatosplenomegaly, no distention.  Skin: Intact without lesions or rashes.  Neurologic: Alert and oriented x 3.  Psych: Normal affect. Extremities: No clubbing or cyanosis.   Assessment/Plan: 1. CAD: Occluded native RCA and SVG-RCA.  Now status post opening of CTO RCA with 4 overlapping Xience DES.  For several months, she has been getting dyspnea and chest tightness with moderate exertion.  This is still better than before CTO opening.  - Continue ASA 81, statin, losartan, Crestor, amlodipine, and ranolazine.  - She will continue Plavix long-term.   - Given mild angina, I will increase metoprolol to 25 mg bid. She has not tolerated Imdur in the past.  - Followup in 1 week to reassess symptoms.   2. Chronic diastolic CHF: She does appear to have mild volume overload.  NYHA class II-III symptoms, dyspnea with moderate exertion.  - Increase Lasix to 40 qam, 20 qpm.  - BMET in 10 days, add KCl 20 mEq daily.  3. Hyperlipidemia: She remains on Zetia, cholestyramine, and Crestor.  She was started on Praluent but was not doing the injections properly (not getting the drug) and LDL remained high on last check.  She has had education on proper injection technique.  4. Hypertension: BP is controlled.  Continue current regimen.  5. Cerebrovascular disease: She has history of CVA and had left carotid stent in 2/15. Followed by [facility]. She has [facility] followup in 4/16.  6. Hematemesis: Resolved.  On Protonix and Carafate now.  Will be getting EGD by Dr Olevia Perches, ok to continue Plavix and ASA.    Loralie Champagne 02/14/2014

## 2014-02-15 ENCOUNTER — Telehealth: Payer: Self-pay | Admitting: Cardiology

## 2014-02-15 NOTE — Telephone Encounter (Signed)
New message     Returning [name] [name]'s call.  Please call between 9-11am.

## 2014-02-15 NOTE — Telephone Encounter (Signed)
LM with husband with recent lab results, he said he would tell patient.

## 2014-02-25 ENCOUNTER — Other Ambulatory Visit (INDEPENDENT_AMBULATORY_CARE_PROVIDER_SITE_OTHER): Payer: Medicare Other | Admitting: *Deleted

## 2014-02-25 DIAGNOSIS — R0602 Shortness of breath: Secondary | ICD-10-CM

## 2014-02-25 DIAGNOSIS — I5032 Chronic diastolic (congestive) heart failure: Secondary | ICD-10-CM

## 2014-02-25 LAB — BASIC METABOLIC PANEL
BUN: 24 mg/dL — ABNORMAL HIGH (ref 6–23)
CO2: 29 mEq/L (ref 19–32)
Calcium: 9.4 mg/dL (ref 8.4–10.5)
Chloride: 105 mEq/L (ref 96–112)
Creatinine, Ser: 0.95 mg/dL (ref 0.40–1.20)
GFR: 62.51 mL/min (ref >60.00)
Glucose, Bld: 211 mg/dL — ABNORMAL HIGH (ref 70–99)
Potassium: 5.2 mEq/L — ABNORMAL HIGH (ref 3.5–5.1)
Sodium: 139 mEq/L (ref 135–145)

## 2014-02-26 ENCOUNTER — Other Ambulatory Visit: Payer: Self-pay | Admitting: *Deleted

## 2014-02-27 ENCOUNTER — Encounter (HOSPITAL_COMMUNITY): Payer: Self-pay | Admitting: *Deleted

## 2014-02-28 ENCOUNTER — Other Ambulatory Visit: Payer: Self-pay

## 2014-02-28 MED ORDER — NITROGLYCERIN 0.3 MG SL SUBL: 0.3000 mg | SUBLINGUAL_TABLET | SUBLINGUAL | Status: DC | PRN

## 2014-02-28 MED ORDER — ROSUVASTATIN CALCIUM 40 MG PO TABS: 40.0000 mg | ORAL_TABLET | Freq: Every day | ORAL | Status: DC

## 2014-03-11 ENCOUNTER — Telehealth: Payer: Self-pay | Admitting: *Deleted

## 2014-03-11 ENCOUNTER — Encounter (HOSPITAL_COMMUNITY): Payer: Self-pay | Admitting: Gastroenterology

## 2014-03-11 ENCOUNTER — Ambulatory Visit: Payer: Medicare Other | Admitting: Cardiology

## 2014-03-11 ENCOUNTER — Ambulatory Visit (HOSPITAL_COMMUNITY): Payer: Medicare Other | Admitting: Anesthesiology

## 2014-03-11 ENCOUNTER — Encounter (HOSPITAL_COMMUNITY)
Admission: RE | Disposition: A | Discharge status: Home / Self Care | Payer: Self-pay | Source: Ambulatory Visit | Attending: Internal Medicine

## 2014-03-11 ENCOUNTER — Ambulatory Visit (HOSPITAL_COMMUNITY)
Admission: RE | Admit: 2014-03-11 | Discharge: 2014-03-11 | Disposition: A | Discharge status: Home / Self Care | Payer: Medicare Other | Source: Ambulatory Visit | Attending: Internal Medicine | Admitting: Internal Medicine

## 2014-03-11 ENCOUNTER — Other Ambulatory Visit: Payer: Self-pay | Admitting: *Deleted

## 2014-03-11 DIAGNOSIS — G4733 Obstructive sleep apnea (adult) (pediatric): Secondary | ICD-10-CM | POA: Insufficient documentation

## 2014-03-11 DIAGNOSIS — M170 Bilateral primary osteoarthritis of knee: Secondary | ICD-10-CM | POA: Diagnosis not present

## 2014-03-11 DIAGNOSIS — M19041 Primary osteoarthritis, right hand: Secondary | ICD-10-CM | POA: Insufficient documentation

## 2014-03-11 DIAGNOSIS — Z7902 Long term (current) use of antithrombotics/antiplatelets: Secondary | ICD-10-CM | POA: Insufficient documentation

## 2014-03-11 DIAGNOSIS — K219 Gastro-esophageal reflux disease without esophagitis: Secondary | ICD-10-CM | POA: Insufficient documentation

## 2014-03-11 DIAGNOSIS — Z862 Personal history of diseases of the blood and blood-forming organs and certain disorders involving the immune mechanism: Secondary | ICD-10-CM | POA: Diagnosis not present

## 2014-03-11 DIAGNOSIS — I252 Old myocardial infarction: Secondary | ICD-10-CM | POA: Diagnosis not present

## 2014-03-11 DIAGNOSIS — M19042 Primary osteoarthritis, left hand: Secondary | ICD-10-CM | POA: Diagnosis not present

## 2014-03-11 DIAGNOSIS — N189 Chronic kidney disease, unspecified: Secondary | ICD-10-CM | POA: Diagnosis not present

## 2014-03-11 DIAGNOSIS — I5030 Unspecified diastolic (congestive) heart failure: Secondary | ICD-10-CM | POA: Diagnosis not present

## 2014-03-11 DIAGNOSIS — Z955 Presence of coronary angioplasty implant and graft: Secondary | ICD-10-CM | POA: Insufficient documentation

## 2014-03-11 DIAGNOSIS — J45909 Unspecified asthma, uncomplicated: Secondary | ICD-10-CM | POA: Insufficient documentation

## 2014-03-11 DIAGNOSIS — Z7901 Long term (current) use of anticoagulants: Secondary | ICD-10-CM | POA: Diagnosis not present

## 2014-03-11 DIAGNOSIS — Z8673 Personal history of transient ischemic attack (TIA), and cerebral infarction without residual deficits: Secondary | ICD-10-CM | POA: Diagnosis not present

## 2014-03-11 DIAGNOSIS — Z7982 Long term (current) use of aspirin: Secondary | ICD-10-CM | POA: Diagnosis not present

## 2014-03-11 DIAGNOSIS — R110 Nausea: Secondary | ICD-10-CM

## 2014-03-11 DIAGNOSIS — Z951 Presence of aortocoronary bypass graft: Secondary | ICD-10-CM | POA: Insufficient documentation

## 2014-03-11 DIAGNOSIS — K2970 Gastritis, unspecified, without bleeding: Secondary | ICD-10-CM | POA: Diagnosis not present

## 2014-03-11 DIAGNOSIS — M549 Dorsalgia, unspecified: Secondary | ICD-10-CM | POA: Insufficient documentation

## 2014-03-11 DIAGNOSIS — I739 Peripheral vascular disease, unspecified: Secondary | ICD-10-CM | POA: Insufficient documentation

## 2014-03-11 DIAGNOSIS — K3184 Gastroparesis: Secondary | ICD-10-CM | POA: Diagnosis not present

## 2014-03-11 DIAGNOSIS — G8929 Other chronic pain: Secondary | ICD-10-CM | POA: Diagnosis not present

## 2014-03-11 DIAGNOSIS — Z9884 Bariatric surgery status: Secondary | ICD-10-CM | POA: Diagnosis not present

## 2014-03-11 DIAGNOSIS — I2510 Atherosclerotic heart disease of native coronary artery without angina pectoris: Secondary | ICD-10-CM | POA: Insufficient documentation

## 2014-03-11 DIAGNOSIS — R112 Nausea with vomiting, unspecified: Secondary | ICD-10-CM | POA: Diagnosis present

## 2014-03-11 DIAGNOSIS — K920 Hematemesis: Secondary | ICD-10-CM | POA: Insufficient documentation

## 2014-03-11 DIAGNOSIS — I129 Hypertensive chronic kidney disease with stage 1 through stage 4 chronic kidney disease, or unspecified chronic kidney disease: Secondary | ICD-10-CM | POA: Diagnosis not present

## 2014-03-11 DIAGNOSIS — E119 Type 2 diabetes mellitus without complications: Secondary | ICD-10-CM | POA: Insufficient documentation

## 2014-03-11 DIAGNOSIS — G43909 Migraine, unspecified, not intractable, without status migrainosus: Secondary | ICD-10-CM | POA: Diagnosis not present

## 2014-03-11 DIAGNOSIS — E785 Hyperlipidemia, unspecified: Secondary | ICD-10-CM | POA: Diagnosis not present

## 2014-03-11 DIAGNOSIS — E7879 Other disorders of bile acid and cholesterol metabolism: Secondary | ICD-10-CM | POA: Insufficient documentation

## 2014-03-11 HISTORY — PX: ESOPHAGOGASTRODUODENOSCOPY (EGD) WITH PROPOFOL: SHX5813

## 2014-03-11 HISTORY — DX: Cramp and spasm: R25.2

## 2014-03-11 SURGERY — ESOPHAGOGASTRODUODENOSCOPY (EGD) WITH PROPOFOL
Anesthesia: Monitor Anesthesia Care

## 2014-03-11 MED ORDER — LIDOCAINE HCL (CARDIAC) 20 MG/ML IV SOLN
INTRAVENOUS | Status: AC
  Filled 2014-03-11: qty 5

## 2014-03-11 MED ORDER — MIDAZOLAM HCL 5 MG/5ML IJ SOLN
INTRAMUSCULAR | Status: DC | PRN
  Administered 2014-03-11: 1 mg via INTRAVENOUS

## 2014-03-11 MED ORDER — LACTATED RINGERS IV SOLN
INTRAVENOUS | Status: DC
  Administered 2014-03-11: 1000 mL via INTRAVENOUS

## 2014-03-11 MED ORDER — PROPOFOL 10 MG/ML IV BOLUS
INTRAVENOUS | Status: AC
  Filled 2014-03-11: qty 20

## 2014-03-11 MED ORDER — ONDANSETRON HCL 4 MG PO TABS: 4.0000 mg | ORAL_TABLET | Freq: Three times a day (TID) | ORAL | Status: DC | PRN

## 2014-03-11 MED ORDER — MIDAZOLAM HCL 2 MG/2ML IJ SOLN
INTRAMUSCULAR | Status: AC
  Filled 2014-03-11: qty 2

## 2014-03-11 MED ORDER — LACTATED RINGERS IV SOLN
INTRAVENOUS | Status: DC | PRN
  Administered 2014-03-11: 12:00:00 via INTRAVENOUS

## 2014-03-11 MED ORDER — PROPOFOL 10 MG/ML IV EMUL
INTRAVENOUS | Status: DC | PRN
  Administered 2014-03-11 (×2): 20 mg via INTRAVENOUS
  Administered 2014-03-11: 50 mg via INTRAVENOUS

## 2014-03-11 MED ORDER — SODIUM CHLORIDE 0.9 % IV SOLN: INTRAVENOUS | Status: DC

## 2014-03-11 MED ORDER — PROPOFOL INFUSION 10 MG/ML OPTIME
INTRAVENOUS | Status: DC | PRN
  Administered 2014-03-11: 140 ug/kg/min via INTRAVENOUS

## 2014-03-11 SURGICAL SUPPLY — 14 items

## 2014-03-11 NOTE — Anesthesia Preprocedure Evaluation (Signed)
Anesthesia Evaluation  Patient identified by [name], date of birth, ID band Patient awake    Reviewed: Allergy & Precautions, NPO status , Patient's Chart, lab work & pertinent test results  History of Anesthesia Complications (+) PONV and history of anesthetic complications  Airway Mallampati: II  TM Distance: >3 FB Neck ROM: Full    Dental no notable dental hx.    Pulmonary shortness of breath, asthma , sleep apnea , pneumonia -,  breath sounds clear to auscultation  Pulmonary exam normal       Cardiovascular hypertension, + angina with exertion + CAD, + Past MI, + Peripheral Vascular Disease and +CHF + dysrhythmias + Valvular Problems/Murmurs Rhythm:Regular Rate:Normal     Neuro/Psych  Headaches, PSYCHIATRIC DISORDERS Anxiety TIA Neuromuscular disease CVA negative psych ROS   GI/Hepatic Neg liver ROS, hiatal hernia, GERD-  ,  Endo/Other  negative endocrine ROSdiabetes  Renal/GU Renal disease  negative genitourinary   Musculoskeletal  (+) Arthritis -,   Abdominal   Peds negative pediatric ROS (+)  Hematology  (+) anemia ,   Anesthesia Other Findings   Reproductive/Obstetrics negative OB ROS                             Anesthesia Physical Anesthesia Plan  ASA: III  Anesthesia Plan: MAC   Post-op Pain Management:    Induction: Intravenous  Airway Management Planned:   Additional Equipment:   Intra-op Plan:   Post-operative Plan:   Informed Consent: I have reviewed the patients History and Physical, chart, labs and discussed the procedure including the risks, benefits and alternatives for the proposed anesthesia with the patient or authorized representative who has indicated his/her understanding and acceptance.   Dental advisory given  Plan Discussed with: CRNA  Anesthesia Plan Comments:         Anesthesia Quick Evaluation

## 2014-03-11 NOTE — Op Note (Signed)
[facility] [place], [address]   ENDOSCOPY PROCEDURE REPORT  PATIENT: [name] [name], [name] [name]  MR#: [id number] BIRTHDATE: Sep 22, 1945 , 68  yrs. old GENDER: female ENDOSCOPIST: Lafayette Dragon, MD REFERRED BY:  Derinda Late, M.D. PROCEDURE DATE:  03/11/2014 PROCEDURE:  EGD w/ biopsy ASA CLASS:     Class III INDICATIONS:  nausea, vomiting, and hematemesis.  History or banded gastroplasty in 1980.  Last endoscopy in July 2013 by Dr.  [name] showed 4 cm gastric pouch she has a history of gastroparesis. Patient has been on Plavix. MEDICATIONS: Monitored anesthesia care TOPICAL ANESTHETIC: none  DESCRIPTION OF PROCEDURE: After the risks benefits and alternatives of the procedure were thoroughly explained, informed consent was obtained.  The Pentax Gastroscope [id number] endoscope was introduced through the mouth and advanced to the second portion of the duodenum , Without limitations.  The instrument was slowly withdrawn as the mucosa was fully examined.    Esophagus: esophagus was easily intubated. Esophageal mucosa appeared normal through the proximal, mid and distal esophagus. Squamocolumnar junction was located at 35 cm from the incisors and was regular. There was no stricture or esophagitis  Stomach:  gastric pouch could not be insufflated. It remained collapsed and it measured 4-5 cm from the Z line. It extended from 35 to 40  cm from the incisors[  .there was no food or liquid retained in gastric pouch. The gastric band was generously open. It allowed the endoscope to pass through without resistance. The diameter measured at least 2 cm, distal to the band was a normal gastric mucosa with bile reflux and streaks of  erythema converging into the gastric outlet. Biopsies were taken to rule out H. pylori or alkaline gastritis       The scope was then withdrawn from the patient and the procedure completed.  COMPLICATIONS: There were no immediate  complications.  ENDOSCOPIC IMPRESSION: 1. banded gastroplasty with 4-5 cm gastric pouch which was unknown distensible but there was no food retained in the pouch 2. Gastric band opening seemed to be widely patent allowing the endoscope to traverse without resistance 3. Mild antral gastritis with bile reflux. Status post biopsies   RECOMMENDATIONS: 1.  Await pathology results 2.  Anti-reflux regimen to be follow 3 .UGI with Barium esophagram 4. Zofran 4 mg  q 8 hrs prn nausea 5. depending on the upper GI series are consider takedown of banded gastroplasty  REPEAT EXAM: pending biopsies  eSigned:  Lafayette Dragon, MD 03/11/2014 1:41 PM    CC:  PATIENT NAME:  [name], [name] MR#: [id number]

## 2014-03-11 NOTE — H&P (View-Only) (Signed)
[name] [name] 05/12/1945 [id number]  Note: This dictation was prepared with Dragon digital system. Any transcriptional errors that result from this procedure are unintentional.   History of Present Illness: This is a 69 year old white female with severe heart disease who has been on long-term anticoagulation and has been having episodes of bright red hematemesis. The episodes have become more frequent . She has soreness and pain substernally following the  the episode of vomiting. Patient had   banded gastroplasty for obesity in 1980 and has had gastroesophageal reflux. She has had multiple endoscopies which  confirm 4 cm gastric pouch proximal to the gastroplasty with retained bile. Last upper endoscopy in July 2013 by Dr. [name] showed  post gastric banding.anatomy. Gastric  emptying scan in December 2005 confirmed gastroparesis, 57% retain food in 60 minutes and 32% retain in 120 minutes. Upper endoscopy in 2009 showed reflux esophagitis. She had a coronary artery bypass graft in December 2013. She has diastolic  heart failure. She had a CVA in February 2014 and again in October 2015 , now on aspirin and Plavix.by Dr Floyde Parkins, and Dr Andrena Mews. She has a 80% stenosis of left internal carotid artery    Past Medical History  Diagnosis Date  . Vomiting     persistent  . Asthma   . Ventral hernia     hx of  . GERD (gastroesophageal reflux disease)   . Obesity 01-2010  . Hyperlipidemia   . Hypertension   . Irregular heart beat   . Coronary artery disease   . Heart murmur   . Shortness of breath   . Anxiety   . PONV (postoperative nausea and vomiting)   . Peripheral vascular disease     ? numbness, tingling arms and legs  . Anemia     hx  . H/O hiatal hernia   . Neuromuscular disorder     ?  Marland Kitchen Dysrhythmia   . Mental disorder     hx of confusion  . Chronic kidney disease     frequency  . CHF (congestive heart failure)   . Anginal pain   . Myocardial infarction    04/1999, 02/2000, 01/2005; 2011; 2014  . Pneumonia 2000's    "once"  . Chronic bronchitis     "off and on all the time" (07/18/2013)  . Claustrophobia   . Type II diabetes mellitus   . Chronic lower back pain   . Other and unspecified angina pectoris   . Obstructive sleep apnea     "can't wear machine; I have claustrophobia" (07/18/2013)  . Migraine     "used to have them really bad; kind of eased up now; down to 1 q couple months" (07/18/2013)  . Headache     "probably 4 days/wk" (07/18/2013)  . Stroke 03/22/12    right side brain; denies residual on 07/18/2013)  . Osteoarthritis     "knees and hands" (07/18/2013)  . Bundle branch block, left     chronic/notes 07/18/2013  . Vomiting blood     Past Surgical History  Procedure Laterality Date  . Tumor removal  1970's    ovarian  . Tibia fracture surgery Right 1970's    rods and pins  . Gastric bypass  1984    "stapeling"  . Total abdominal hysterectomy  1970's    w/ appendectomy  . Ankle fracture surgery Left 1970's  . Fracture surgery    . Esophagogastroduodenoscopy  08/03/2011    Procedure: ESOPHAGOGASTRODUODENOSCOPY (EGD);  Surgeon: [name]  Bary Leriche, MD;  Location: [facility];  Service: General;  Laterality: N/A;  . Gall stone removal  05/2003  . Root canal  10/2000  . Coronary artery bypass graft  1220/11    "CABG X5"  . Appendectomy  1970's    w/hysterectomy  . Cholecystectomy  ?1980's  . Coronary angioplasty with stent placement  01,02,05,06,07,08,11; 04/24/2013    "I've probably got ~ 10 stents by now" (04/24/2013)  . Coronary angioplasty with stent placement  06/13/2013    "got 4 stents today" (06/13/2013)  . Cardiac catheterization  10/10/2012    Dr [name].  . Carotid endarterectomy Left 03/2013  . Hernia repair  ?7's X2    "in my stomach; had OR on it twice"  . Cerebral angiogram N/A 04/05/2011    Procedure: CEREBRAL ANGIOGRAM;  Surgeon: Angelia Mould, MD;  Location: [facility] CATH LAB;  Service: Cardiovascular;   Laterality: N/A;  . Carotid stent insertion Left 03/20/2013    Procedure: CAROTID STENT INSERTION;  Surgeon: [name], MD;  Location: [facility] CATH LAB;  Service: Cardiovascular;  Laterality: Left;  internal carotid  . Percutaneous stent intervention N/A 04/24/2013    Procedure: PERCUTANEOUS STENT INTERVENTION;  Surgeon: [name], MD;  Location[facility] CATH LAB;  Service: Cardiovascular;  Laterality: N/A;  . Percutaneous coronary stent intervention (pci-s) N/A 06/13/2013    Procedure: PERCUTANEOUS CORONARY STENT INTERVENTION (PCI-S);  Surgeon: [name], MD;  Location: [facility] CATH LAB;  Service: Cardiovascular;  Laterality: N/A;    Allergies  Allergen Reactions  . Amoxicillin Shortness Of Breath and Rash  . Brilinta [Ticagrelor] Shortness Of Breath  . Erythromycin Shortness Of Breath and Other (See Comments)    Trouble swallowing  . Penicillins Shortness Of Breath and Rash  . Metformin And Related     Stomach pain, cold sweats, joint pain, burred vision, dizziness  . Tape Other (See Comments)    Plastic tape causes skin to rip if left on for long periods of time  . Isosorbide Mononitrate [Isosorbide]     Joint aches, muscles hurt, difficult to walk    Family history and social history have been reviewed.  Review of Systems: Negative for dysphagia. Positive for nausea vomiting and hematemesis. Positive for morbid obesity  The remainder of the 10 point ROS is negative except as outlined in the H&P  Physical Exam: General Appearance Well developed, in no distress, morbidly obese Eyes  Non icteric  HEENT  Non traumatic, normocephalic  Mouth No lesion, tongue papillated, no cheilosis Neck Supple without adenopathy, thyroid not enlarged, no carotid bruits, no JVD Lungs Clear to auscultation bilaterally, post thoracotomy scar COR Normal S1, normal S2, regular rhythm, no murmur, quiet precordium Abdomen massively obese. Tender in epigastrium. No ascites. Lower abdomen  unremarkable Rectal soft Hemoccult negative stool Extremities  No pedal edema Skin No lesions Neurological Alert and oriented x 3 Psychological Normal mood and affect  Assessment and Plan:   69 year old white female with severe heart disease and chronic long-term anticoagulation who is having low volume hematemesis. She has a history of banded gastroplasty in 1980 and a small 4 cm gastric pouch. She most likely is having  gastritis in the pouch or Mallory Weiss tear or reflux esophagitis. She is already on Protonix 40 mg twice a day and we will add Carafate slurry 10 cc by mouth twice a day. She will continue strict antireflux measures and gastroparesis diet .She will be scheduled for upper endoscopy but  will not hold  anticoagulants because of high risk for stroke. Because of  high BMI procedure  will be done in an hospital setting with propofol   Morbid obesity. Status post banded gastroplasty in 1980s. Clearly the gastroplasty has not helped.Stapling created too large opening so she can essentially eat whatever she wants  Severe heart disease. Congestive heart failure. Followed by Dr. [name]. She has an appointment tomorrow.       Delfin Edis 02/12/2014

## 2014-03-11 NOTE — Transfer of Care (Signed)
Immediate Anesthesia Transfer of Care Note  Patient: [name] [name]  Procedure(s) Performed: Procedure(s): ESOPHAGOGASTRODUODENOSCOPY (EGD) WITH PROPOFOL (N/A)  Patient Location: PACU  Anesthesia Type:MAC  Level of Consciousness: awake, alert  and oriented  Airway & Oxygen Therapy: Patient Spontanous Breathing and Patient connected to nasal cannula oxygen  Post-op Assessment: Report given to RN and Post -op Vital signs reviewed and stable  Post vital signs: Reviewed and stable  Last Vitals:  Filed Vitals:   03/11/14 1305  BP: 179/81  Pulse: 62  Temp:   Resp: 16    Complications: No apparent anesthesia complications

## 2014-03-11 NOTE — Telephone Encounter (Signed)
Per Dr. Olevia Perches, Needs barium esophagram and UGI. Also needs Zofran 4 mg po every 8 hours prn nausea #30. Rx sent.

## 2014-03-11 NOTE — Interval H&P Note (Signed)
History and Physical Interval Note:  03/11/2014 11:56 AM  [name] [name]  has presented today for surgery, with the diagnosis of vomiting blood   The various methods of treatment have been discussed with the patient and family. After consideration of risks, benefits and other options for treatment, the patient has consented to  Procedure(s): ESOPHAGOGASTRODUODENOSCOPY (EGD) WITH PROPOFOL (N/A) as a surgical intervention .  The patient's history has been reviewed, patient examined, no change in status, stable for surgery.  I have reviewed the patient's chart and labs.  Questions were answered to the patient's satisfaction.     Delfin Edis

## 2014-03-11 NOTE — Anesthesia Postprocedure Evaluation (Signed)
  Anesthesia Post-op Note  Patient: [name] [name]  Procedure(s) Performed: Procedure(s) (LRB): ESOPHAGOGASTRODUODENOSCOPY (EGD) WITH PROPOFOL (N/A)  Patient Location: PACU  Anesthesia Type: MAC  Level of Consciousness: awake and alert   Airway and Oxygen Therapy: Patient Spontanous Breathing  Post-op Pain: mild  Post-op Assessment: Post-op Vital signs reviewed, Patient's Cardiovascular Status Stable, Respiratory Function Stable, Patent Airway and No signs of Nausea or vomiting  Last Vitals:  Filed Vitals:   03/11/14 1410  BP: 175/72  Pulse: 47  Temp:   Resp: 14    Post-op Vital Signs: stable   Complications: No apparent anesthesia complications

## 2014-03-12 ENCOUNTER — Encounter (HOSPITAL_COMMUNITY): Payer: Self-pay | Admitting: Internal Medicine

## 2014-03-12 LAB — GLUCOSE, CAPILLARY: Glucose-Capillary: 191 mg/dL — ABNORMAL HIGH (ref 70–99)

## 2014-03-12 NOTE — Telephone Encounter (Signed)
Scheduled at [facility] radiology Nicole Kindred) and scheduled on 03/21/14 at 9:30 AM. NPO after midnight.

## 2014-03-12 NOTE — Telephone Encounter (Signed)
Patient given appointment and instructions.

## 2014-03-14 ENCOUNTER — Encounter: Payer: Self-pay | Admitting: Cardiology

## 2014-03-14 ENCOUNTER — Ambulatory Visit (INDEPENDENT_AMBULATORY_CARE_PROVIDER_SITE_OTHER): Payer: Medicare Other | Admitting: Cardiology

## 2014-03-14 ENCOUNTER — Ambulatory Visit: Payer: Medicare Other | Admitting: Cardiology

## 2014-03-14 VITALS — BP 110/62 | HR 54 | Ht 62.0 in | Wt 278.0 lb

## 2014-03-14 DIAGNOSIS — I10 Essential (primary) hypertension: Secondary | ICD-10-CM

## 2014-03-14 DIAGNOSIS — I5032 Chronic diastolic (congestive) heart failure: Secondary | ICD-10-CM

## 2014-03-14 DIAGNOSIS — I252 Old myocardial infarction: Secondary | ICD-10-CM

## 2014-03-14 DIAGNOSIS — R0602 Shortness of breath: Secondary | ICD-10-CM

## 2014-03-14 DIAGNOSIS — I25118 Atherosclerotic heart disease of native coronary artery with other forms of angina pectoris: Secondary | ICD-10-CM

## 2014-03-14 LAB — BASIC METABOLIC PANEL
BUN: 28 mg/dL — ABNORMAL HIGH (ref 6–23)
CO2: 31 mEq/L (ref 19–32)
Calcium: 9.4 mg/dL (ref 8.4–10.5)
Chloride: 104 mEq/L (ref 96–112)
Creatinine, Ser: 1.08 mg/dL (ref 0.40–1.20)
GFR: 53.9 mL/min — ABNORMAL LOW (ref >60.00)
Glucose, Bld: 186 mg/dL — ABNORMAL HIGH (ref 70–99)
Potassium: 4.5 mEq/L (ref 3.5–5.1)
Sodium: 139 mEq/L (ref 135–145)

## 2014-03-14 LAB — BRAIN NATRIURETIC PEPTIDE: Pro B Natriuretic peptide (BNP): 113 pg/mL — ABNORMAL HIGH (ref 0.0–100.0)

## 2014-03-14 NOTE — Patient Instructions (Signed)
Your physician recommends that you  lab work today--BMET/BNP  Your physician recommends that you schedule a follow-up appointment in: June 2016.

## 2014-03-16 NOTE — Progress Notes (Signed)
Patient ID: [name] [name], female   DOB: 1945-10-09, 69 y.o.   [name]: [id number] PCP: Dr. Sandi Mariscal  69 yo with history of CAD s/p CABG, diastolic CHF, and cerebrovascular disease with history of CVA presents for cardiology followup. She had CABG x 5 in 12/11.  Prior to the CABG she had multiple PCIs.  She had a CVA in 2/14 that presented as visual loss.  She ended up getting a cerebral angiogram in 6/14 with 70% ostial right vertebral stenosis, > 70% stenosis proximal left posterior cerebral artery, and 123456 LICA stenosis.  She has been on ASA and Plavix.  She recently saw Dr. Scot Dock, and carotid dopplers suggested worsening of LICA stenosis to > [id number].  She has had episodes of transient expressive aphasia for several months.  She is planned for a carotid stent soon.  Given dyspnea and chest pain that was worsened from her baseline, I took [name] for left heart catheterization in 9/14.  This showed patent SVG-D, LIMA-LAD, and sequential SVG-OM branches but SVG-RCA and the native RCA were both totally occluded.  I suspect that her increased symptoms coincided with occlusion of SVG-RCA.  I started her on Imdur to see if this would help with the chest pain and dyspnea.  However, she feels like Imdur causes leg cramps and does not think that she can take it.  I next started her on ranolazine 500 mg bid and titrated this up to 1000 mg bid.  This helped some but not markedly.  Therefore, I had her see Dr. Irish Lack to address opening RCA CTO. He was able to do this in 5/15 with 4 overlapping Xience DES.  This led to resolution of exertional chest pain.   Mrs Rongey was initially on Effient s/p PCI, but this was changed to Brilinta given stroke history.  She became much more short of breath after starting on Brilinta.  I had her stop Brilinta and replaced it with Plavix.  Given risk for interaction with Plavix, Prozac was stopped and her PCP began her on Effexor.  Dyspnea improved off Brilinta.   Patient has been  having daily vomiting. She had an EGD recently by Dr. Olevia Perches and there is some concern that she may need her gastric pouch opened.  She had mild gastritis noted.    She seems a bit better symptomatically compared to last appointment.  She is short of breath in large parking lots and walking up stairs.  Less edema with increased Lasix. She has chest pain that fluctuates.  It is not clearly exertional and is tends to occur when she is worried or under stress.  It is nowhere near as bad as it was prior to last PCI.   Labs (6/14): K 3.8, creatinine 0.71 Labs (8/14): BNP 249, LDL 166 Labs (9/14): K 4.7, creatinine 0.7 Labs (9/14): K 4.6, creatinine 0.9, BNP 109 Labs (1/15): K 4.5, creatinine 0.73, LDL 212, HCT 43.4, TSH normal, BNP 138 Labs (6/15): K 4.7, creatinine 1.17, LDL 100, HDL 37, TGs 363, BNP 39 Labs (10/15): K 4.6, creatinine 1.2 Labs (1/16): LDL 157, HDL 43, K 5.2, creatinine 0.95  ECG: NSR, LBBB  PMH: 1. Diabetic gastroparesis 2. Type II diabetes 3. HTN 4. Morbid obesity 5. CAD: s/p CABG in 12/11 after prior PCIs.  LIMA-LAD, SVG-D, seq SVG-OM1 and OM2, SVG-PDA. Adenosine Cardiolite (8/14) with EF 53% and a small reversible apical defect with a medium, partially reversible inferior defect.  LHC (9/14) with patent SVG-D, LIMA-LAD (50% distal LAD),  and sequential SVG-OM branches; the SVG-RCA and the native RCA were occluded.  This was managed medically initially, but with ongoing exertional chest pain, it was decided to open CTO.  Patient had CTO opening with 4 overlapping Xience DES in the RCA in 5/15.  .  6. Atypical migraines 7. OSA: Intolerant of CPAP.  8. GERD with hiatal hernia 9. OA 10. Diastolic CHF: Echo ([id number]) with EF 50-55%, grade II diastolic dysfunction, mild-moderate MR.  Echo (8/14) with EF 55-60%, grade II diastolic dysfunction, mildly increased aortic valve gradient (mean 12 mmHg) but valve opens well, mild MR and mild RV dilation.  Echo (9/15) with EF 60-65%, grade II  diastolic dysfunction, mild aortic stenosis, mild mitral stenosis, mild to moderate mitral regurgitation, mildly dilated RV with normal systolic function, PA systolic pressure 46 mmHg.  11. CKD 12. Chronic LBBB 13. Anxiety 14. Carotid stenosis: Followed by [facility], [id number] LICA stenosis [id number].  She had left carotid stent in 2/15. Carotid dopplers 8/15 with no significant disease.  15. Cerebrovascular disease: CVA 2/14 with right posterior cerebral artery territory ischemic infarction. Cerebral angiogram in 6/14 showed 70% right vertebral ostial stenosis, 123456 LICA stenosis, > [id number] proximal left posterior cerebral artery stenosis, posterior communicating artery aneurysm.  Patient has had episodes of transient expressive aphasia.  Carotid dopplers ([id number]) showed [id number] LICA stenosis. She had a left carotid stent 2/15.  Possible CVA in 7/15.  16. Positional vertigo (suspected) 17. Palpitations: Holter (6/15) with rare PVCs/PACs.  18. Dyspnea with Brilinta.  19. Aortic stenosis: Mild.   SH: Married, nonsmoker  FH: CAD  ROS: All systems reviewed and negative except as per HPI.   Current Outpatient Prescriptions  Medication Sig Dispense Refill  . albuterol (PROVENTIL) (2.5 MG/3ML) 0.083% nebulizer solution Take 2.5 mg by nebulization every 6 (six) hours as needed for shortness of breath (when asthma is active).     Marland Kitchen albuterol (PROVENTIL,VENTOLIN) 90 MCG/ACT inhaler Inhale 2 puffs into the lungs every 6 (six) hours as needed for shortness of breath.     . Alirocumab (PRALUENT) 75 MG/ML SOSY Inject 75 mg into the skin every 14 (fourteen) days.    . ALPRAZolam (XANAX) 0.5 MG tablet Take 0.5 mg by mouth 3 (three) times daily as needed for anxiety. For anxiety - Patient takes 1 in the morning, then 1 in the evening, 2 at bedtime    . amLODipine (NORVASC) 10 MG tablet Take 1 tablet (10 mg total) by mouth daily. (Patient taking differently: Take 10 mg by mouth daily with breakfast. ) 30 tablet 6  . aspirin EC 81  MG tablet Take 81 mg by mouth daily.    Marland Kitchen b complex vitamins capsule Take 1 capsule by mouth daily.    . Calcium Carbonate 1500 (600 CA) MG TABS Take 1,500 mg by mouth daily with breakfast.    . Cholecalciferol (VITAMIN D) 2000 UNITS tablet Take 2,000 Units by mouth daily with breakfast.     . cholestyramine (QUESTRAN) 4 GM/DOSE powder Take 2 g by mouth 2 (two) times daily with a meal. (mixed in juice or light kool-aid).    . cloNIDine (CATAPRES) 0.2 MG tablet Take 0.2 mg by mouth 2 (two) times daily.    . clopidogrel (PLAVIX) 75 MG tablet TAKE 2 TABS TONIGHT (150 TOTAL), AND THEN 75 MG THEREAFTER (Patient taking differently: Take 75 mg by mouth at bedtime. ) 90 tablet 3  . DULoxetine (CYMBALTA) 60 MG capsule Take 60 mg by mouth daily with breakfast.    .  ezetimibe (ZETIA) 10 MG tablet Take 1 tablet (10 mg total) by mouth daily. (Patient taking differently: Take 10 mg by mouth at bedtime. ) 30 tablet 11  . Fluticasone-Salmeterol (ADVAIR DISKUS) 250-50 MCG/DOSE AEPB Inhale 1 puff into the lungs every 12 (twelve) hours.     . furosemide (LASIX) 40 MG tablet 1 tablet (40mg ) by mouth in the AM and 1/2 tablet (20mg ) by mouth in the PM (Patient taking differently: Take 20-40 mg by mouth 2 (two) times daily. 1 tablet (40mg ) by mouth in the AM and 1/2 tablet (20mg ) by mouth in the PM) 135 tablet 3  . gabapentin (NEURONTIN) 600 MG tablet Take 300-600 mg by mouth 3 (three) times daily. 1 tablet AM, 1 tablet at noon and 1 1/2 tablet at bedtime.    Marland Kitchen HUMALOG KWIKPEN 100 UNIT/ML SOPN Inject 10-14 Units into the skin 4 (four) times daily -  with meals and at bedtime. 10 units at every meal and 14 units at bedtime if needed    . hydrALAZINE (APRESOLINE) 50 MG tablet Take 50 mg by mouth 3 (three) times daily as needed (for high blood pressure). If BP is >175    . HYDROcodone-acetaminophen (NORCO/VICODIN) 5-325 MG per tablet Take 1 tablet by mouth at bedtime as needed.     . insulin glargine (LANTUS) 100 UNIT/ML  injection Inject 48 Units into the skin daily before breakfast.     . ipratropium-albuterol (DUONEB) 0.5-2.5 (3) MG/3ML SOLN Take 3 mLs by nebulization as needed (If severe asthma , use every 4 hours for 10 days).     Marland Kitchen loperamide (IMODIUM) 2 MG capsule Take 2 mg by mouth as needed for diarrhea or loose stools.    Marland Kitchen losartan (COZAAR) 100 MG tablet Take 100 mg by mouth daily with breakfast.     . Magnesium Oxide 250 MG TABS Take 250 mg by mouth 2 (two) times daily.     . metoprolol tartrate (LOPRESSOR) 25 MG tablet Take 1 tablet (25 mg total) by mouth 2 (two) times daily. 180 tablet 3  . Multiple Vitamin (MULTIVITAMIN) tablet Take 1 tablet by mouth at bedtime.     . nitroGLYCERIN (NITROSTAT) 0.3 MG SL tablet Place 1 tablet (0.3 mg total) under the tongue every 5 (five) minutes x 3 doses as needed for chest pain. 90 tablet 1  . ondansetron (ZOFRAN) 4 MG tablet Take 1 tablet (4 mg total) by mouth 2 (two) times daily as needed. For nausea 20 tablet 0  . pantoprazole (PROTONIX) 40 MG tablet Take 1 tablet (40 mg total) by mouth 2 (two) times daily. 180 tablet 3  . potassium chloride SA (K-DUR,KLOR-CON) 20 MEQ tablet Take 20 mEq by mouth at bedtime.    . ranolazine (RANEXA) 1000 MG SR tablet Take 1 tablet (1,000 mg total) by mouth 2 (two) times daily. 60 tablet 11  . rosuvastatin (CRESTOR) 40 MG tablet Take 1 tablet (40 mg total) by mouth daily. (Patient taking differently: Take 40 mg by mouth at bedtime. ) 90 tablet 3  . sucralfate (CARAFATE) 1 GM/10ML suspension Take 10 mLs (1 g total) by mouth 2 (two) times daily. 420 mL 1  . topiramate (TOPAMAX) 50 MG tablet Take 1 tablet (50 mg total) by mouth 2 (two) times daily. 60 tablet 6  . triamterene-hydrochlorothiazide (DYAZIDE) 37.5-25 MG per capsule Take 1 capsule by mouth at bedtime.     Marland Kitchen zinc gluconate 50 MG tablet Take 50 mg by mouth at bedtime.     Marland Kitchen  zolpidem (AMBIEN) 10 MG tablet Take 10 mg by mouth at bedtime as needed for sleep.      No current  facility-administered medications for this visit.    BP 110/62 mmHg  Pulse 54  Ht 5\' 2"  (1.575 m)  Wt 278 lb (126.1 kg)  BMI 50.83 kg/m2  SpO2 93% General: NAD, obese Neck: JVP 7-8 cm, no thyromegaly or thyroid nodule.  Lungs: Occasional squeaks noted on lung exam.  CV: Nondisplaced PMI.  Heart regular S1/S2 with wide S2 split, no S3/S4, 1/6 early SEM.  1+ edema 1/3 up lower legs bilaterally.  No carotid bruit.  Normal pedal pulses.  Abdomen: Soft, nontender, no hepatosplenomegaly, no distention.  Skin: Intact without lesions or rashes.  Neurologic: Alert and oriented x 3.  Psych: Normal affect. Extremities: No clubbing or cyanosis.   Assessment/Plan: 1. CAD: Occluded native RCA and SVG-RCA.  Now status post opening of CTO RCA with 4 overlapping Xience DES.  Currently, she is having occasional atypical chest pain.   - Continue ASA 81, statin, losartan, Crestor, amlodipine, metoprolol, and ranolazine.  - She will continue Plavix long-term.   - She has not tolerated Imdur in the past.  2. Chronic diastolic CHF: Volume status looks better after increasing Lasix.  - Continue Lasix to 40 qam, 20 qpm.  - BMET/BNP today.   3. Hyperlipidemia: She remains on Zetia, cholestyramine, and Crestor.  She has now also started Praluent and is now injecting it correctly.  4. Hypertension: BP is controlled.  Continue current regimen.  5. Cerebrovascular disease: She has history of CVA and had left carotid stent in 2/15. Followed by [facility]. She has [facility] followup in 4/16.  6. Hematemesis: Resolved.  On Protonix and Carafate now.  She had EGD in 2/16.  There is some concern that given her frequent vomiting, she will need opening of her gastric pouch.  Surgery will be high risk given her comorbidities. Would not plan any elective procedure prior to 5/16, which would be 1 year post-PCI.    Loralie Champagne 03/16/2014

## 2014-03-21 ENCOUNTER — Ambulatory Visit (HOSPITAL_COMMUNITY)
Admission: RE | Admit: 2014-03-21 | Discharge: 2014-03-21 | Disposition: A | Discharge status: Home / Self Care | Payer: Medicare Other | Source: Ambulatory Visit | Attending: Internal Medicine | Admitting: Internal Medicine

## 2014-03-21 ENCOUNTER — Encounter: Payer: Self-pay | Admitting: Internal Medicine

## 2014-03-21 ENCOUNTER — Ambulatory Visit (HOSPITAL_COMMUNITY)
Admit: 2014-03-21 | Discharge: 2014-03-21 | Disposition: A | Discharge status: Home / Self Care | Payer: Medicare Other | Attending: Internal Medicine | Admitting: Internal Medicine

## 2014-03-21 DIAGNOSIS — K920 Hematemesis: Secondary | ICD-10-CM

## 2014-03-21 DIAGNOSIS — Z9884 Bariatric surgery status: Secondary | ICD-10-CM | POA: Insufficient documentation

## 2014-03-25 ENCOUNTER — Other Ambulatory Visit: Payer: Self-pay | Admitting: Family Medicine

## 2014-03-27 ENCOUNTER — Emergency Department (HOSPITAL_BASED_OUTPATIENT_CLINIC_OR_DEPARTMENT_OTHER)
Admission: EM | Admit: 2014-03-27 | Discharge: 2014-03-27 | Disposition: A | Discharge status: Home / Self Care | Payer: Medicare Other | Attending: Emergency Medicine | Admitting: Emergency Medicine

## 2014-03-27 ENCOUNTER — Emergency Department (HOSPITAL_BASED_OUTPATIENT_CLINIC_OR_DEPARTMENT_OTHER): Payer: Medicare Other

## 2014-03-27 ENCOUNTER — Encounter (HOSPITAL_BASED_OUTPATIENT_CLINIC_OR_DEPARTMENT_OTHER): Payer: Self-pay

## 2014-03-27 DIAGNOSIS — Z9861 Coronary angioplasty status: Secondary | ICD-10-CM | POA: Diagnosis not present

## 2014-03-27 DIAGNOSIS — Z23 Encounter for immunization: Secondary | ICD-10-CM | POA: Diagnosis not present

## 2014-03-27 DIAGNOSIS — Z8673 Personal history of transient ischemic attack (TIA), and cerebral infarction without residual deficits: Secondary | ICD-10-CM | POA: Insufficient documentation

## 2014-03-27 DIAGNOSIS — Z794 Long term (current) use of insulin: Secondary | ICD-10-CM | POA: Diagnosis not present

## 2014-03-27 DIAGNOSIS — G8929 Other chronic pain: Secondary | ICD-10-CM | POA: Diagnosis not present

## 2014-03-27 DIAGNOSIS — R011 Cardiac murmur, unspecified: Secondary | ICD-10-CM | POA: Diagnosis not present

## 2014-03-27 DIAGNOSIS — I25119 Atherosclerotic heart disease of native coronary artery with unspecified angina pectoris: Secondary | ICD-10-CM | POA: Insufficient documentation

## 2014-03-27 DIAGNOSIS — G43909 Migraine, unspecified, not intractable, without status migrainosus: Secondary | ICD-10-CM | POA: Diagnosis not present

## 2014-03-27 DIAGNOSIS — I509 Heart failure, unspecified: Secondary | ICD-10-CM | POA: Insufficient documentation

## 2014-03-27 DIAGNOSIS — Y998 Other external cause status: Secondary | ICD-10-CM | POA: Diagnosis not present

## 2014-03-27 DIAGNOSIS — Z9889 Other specified postprocedural states: Secondary | ICD-10-CM | POA: Diagnosis not present

## 2014-03-27 DIAGNOSIS — G709 Myoneural disorder, unspecified: Secondary | ICD-10-CM | POA: Diagnosis not present

## 2014-03-27 DIAGNOSIS — I739 Peripheral vascular disease, unspecified: Secondary | ICD-10-CM | POA: Insufficient documentation

## 2014-03-27 DIAGNOSIS — E785 Hyperlipidemia, unspecified: Secondary | ICD-10-CM | POA: Insufficient documentation

## 2014-03-27 DIAGNOSIS — Z7982 Long term (current) use of aspirin: Secondary | ICD-10-CM | POA: Diagnosis not present

## 2014-03-27 DIAGNOSIS — S91311A Laceration without foreign body, right foot, initial encounter: Secondary | ICD-10-CM | POA: Diagnosis not present

## 2014-03-27 DIAGNOSIS — W25XXXA Contact with sharp glass, initial encounter: Secondary | ICD-10-CM | POA: Insufficient documentation

## 2014-03-27 DIAGNOSIS — K219 Gastro-esophageal reflux disease without esophagitis: Secondary | ICD-10-CM | POA: Diagnosis not present

## 2014-03-27 DIAGNOSIS — Z79899 Other long term (current) drug therapy: Secondary | ICD-10-CM | POA: Diagnosis not present

## 2014-03-27 DIAGNOSIS — Z880 Allergy status to penicillin: Secondary | ICD-10-CM | POA: Insufficient documentation

## 2014-03-27 DIAGNOSIS — F419 Anxiety disorder, unspecified: Secondary | ICD-10-CM | POA: Insufficient documentation

## 2014-03-27 DIAGNOSIS — Y9389 Activity, other specified: Secondary | ICD-10-CM | POA: Diagnosis not present

## 2014-03-27 DIAGNOSIS — Z7901 Long term (current) use of anticoagulants: Secondary | ICD-10-CM | POA: Diagnosis not present

## 2014-03-27 DIAGNOSIS — Z951 Presence of aortocoronary bypass graft: Secondary | ICD-10-CM | POA: Insufficient documentation

## 2014-03-27 DIAGNOSIS — Z8701 Personal history of pneumonia (recurrent): Secondary | ICD-10-CM | POA: Diagnosis not present

## 2014-03-27 DIAGNOSIS — Z7951 Long term (current) use of inhaled steroids: Secondary | ICD-10-CM | POA: Insufficient documentation

## 2014-03-27 DIAGNOSIS — N189 Chronic kidney disease, unspecified: Secondary | ICD-10-CM | POA: Insufficient documentation

## 2014-03-27 DIAGNOSIS — E119 Type 2 diabetes mellitus without complications: Secondary | ICD-10-CM | POA: Diagnosis not present

## 2014-03-27 DIAGNOSIS — I499 Cardiac arrhythmia, unspecified: Secondary | ICD-10-CM | POA: Insufficient documentation

## 2014-03-27 DIAGNOSIS — E669 Obesity, unspecified: Secondary | ICD-10-CM | POA: Diagnosis not present

## 2014-03-27 DIAGNOSIS — I129 Hypertensive chronic kidney disease with stage 1 through stage 4 chronic kidney disease, or unspecified chronic kidney disease: Secondary | ICD-10-CM | POA: Diagnosis not present

## 2014-03-27 DIAGNOSIS — S99921A Unspecified injury of right foot, initial encounter: Secondary | ICD-10-CM | POA: Diagnosis present

## 2014-03-27 DIAGNOSIS — Z862 Personal history of diseases of the blood and blood-forming organs and certain disorders involving the immune mechanism: Secondary | ICD-10-CM | POA: Diagnosis not present

## 2014-03-27 DIAGNOSIS — Y92000 Kitchen of unspecified non-institutional (private) residence as  the place of occurrence of the external cause: Secondary | ICD-10-CM | POA: Insufficient documentation

## 2014-03-27 DIAGNOSIS — J45909 Unspecified asthma, uncomplicated: Secondary | ICD-10-CM | POA: Insufficient documentation

## 2014-03-27 DIAGNOSIS — M1990 Unspecified osteoarthritis, unspecified site: Secondary | ICD-10-CM | POA: Insufficient documentation

## 2014-03-27 DIAGNOSIS — I252 Old myocardial infarction: Secondary | ICD-10-CM | POA: Insufficient documentation

## 2014-03-27 DIAGNOSIS — S90851A Superficial foreign body, right foot, initial encounter: Secondary | ICD-10-CM

## 2014-03-27 MED ORDER — LIDOCAINE-EPINEPHRINE (PF) 2 %-1:200000 IJ SOLN
10.0000 mL | Freq: Once | INTRAMUSCULAR | Status: AC
  Administered 2014-03-27: 10 mL via INTRADERMAL
  Filled 2014-03-27: qty 20

## 2014-03-27 MED ORDER — TETANUS-DIPHTH-ACELL PERTUSSIS 5-2.5-18.5 LF-MCG/0.5 IM SUSP
0.5000 mL | Freq: Once | INTRAMUSCULAR | Status: AC
  Administered 2014-03-27: 0.5 mL via INTRAMUSCULAR
  Filled 2014-03-27: qty 0.5

## 2014-03-27 MED ORDER — CIPROFLOXACIN HCL 500 MG PO TABS: 500.0000 mg | ORAL_TABLET | Freq: Two times a day (BID) | ORAL | Status: DC

## 2014-03-27 NOTE — ED Notes (Signed)
Stepped on something in kitchen-right foot fb

## 2014-03-27 NOTE — ED Provider Notes (Signed)
CSN: [id number]     Arrival date & time 03/27/14  1702 History   First MD Initiated Contact with Patient 03/27/14 1817     Chief Complaint  Patient presents with  . Foot Injury     (Consider location/radiation/quality/duration/timing/severity/associated sxs/prior Treatment) Patient is a 69 y.o. female presenting with foot injury. The history is provided by the patient and a relative. No language interpreter was used.  Foot Injury Associated symptoms: no fever   [name] presents for right foot pain after stepping on an object in her kitchen about 4 hours ago.  She is unaware of what she stepped on and unable to bear weight on the foot secondary to pain. Afterward, she removed her sock which had a small amount of blood. She has no erythema, ecchymosis, or swelling of the right foot. She does not remember the date of her last tetanus vaccination.  Past Medical History  Diagnosis Date  . Vomiting     persistent  . Asthma   . Ventral hernia     hx of  . GERD (gastroesophageal reflux disease)   . Obesity 01-2010  . Hyperlipidemia   . Hypertension   . Irregular heart beat   . Coronary artery disease   . Heart murmur   . Anxiety   . PONV (postoperative nausea and vomiting)   . Peripheral vascular disease     ? numbness, tingling arms and legs  . Anemia     hx  . H/O hiatal hernia   . Neuromuscular disorder     ?  Marland Kitchen Dysrhythmia   . Mental disorder     hx of confusion  . CHF (congestive heart failure)   . Anginal pain   . Pneumonia 2000's    "once"  . Chronic bronchitis     "off and on all the time" (07/18/2013)  . Claustrophobia   . Type II diabetes mellitus   . Chronic lower back pain   . Other and unspecified angina pectoris   . Obstructive sleep apnea     "can't wear machine; I have claustrophobia" (07/18/2013)  . Osteoarthritis     "knees and hands" (07/18/2013)  . Bundle branch block, left     chronic/notes 07/18/2013  . Vomiting blood   . Myocardial  infarction     04/1999, 02/2000, 01/2005; 2011; 2014  . Stroke 03/22/12    right side brain; denies residual on 07/18/2013)  . Shortness of breath     with exertion  . Chronic kidney disease     frequency, sses dr Jamal Maes every 4 to 6 months  . Migraine     "used to have them really bad; kind of eased up now; down to 1 q couple months" (07/18/2013)  . Headache     "probably 4 days/wk" (07/18/2013)  . Leg cramps     both legs at times   Past Surgical History  Procedure Laterality Date  . Tumor removal  1970's    ovarian  . Tibia fracture surgery Right 1970's    rods and pins  . Gastric bypass  1984    "stapeling"  . Total abdominal hysterectomy  1970's    w/ appendectomy  . Ankle fracture surgery Left 1970's  . Fracture surgery    . Esophagogastroduodenoscopy  08/03/2011    Procedure: ESOPHAGOGASTRODUODENOSCOPY (EGD);  Surgeon: Shann Medal, MD;  Location: [facility];  Service: General;  Laterality: N/A;  . Gall stone removal  05/2003  . Root canal  10/2000  . Appendectomy  1970's    w/hysterectomy  . Coronary angioplasty with stent placement  01,02,05,06,07,08,11; 04/24/2013    "I've probably got ~ 10 stents by now" (04/24/2013)  . Coronary angioplasty with stent placement  06/13/2013    "got 4 stents today" (06/13/2013)  . Cardiac catheterization  10/10/2012    Dr [name].  . Carotid endarterectomy Left 03/2013  . Cerebral angiogram N/A 04/05/2011    Procedure: CEREBRAL ANGIOGRAM;  Surgeon: Angelia Mould, MD;  Location: [facility] CATH LAB;  Service: Cardiovascular;  Laterality: N/A;  . Carotid stent insertion Left 03/20/2013    Procedure: CAROTID STENT INSERTION;  Surgeon: [name], MD;  Location: [facility] CATH LAB;  Service: Cardiovascular;  Laterality: Left;  internal carotid  . Percutaneous stent intervention N/A 04/24/2013    Procedure: PERCUTANEOUS STENT INTERVENTION;  Surgeon: [name], MD;  Location: [facility] CATH LAB;  Service: Cardiovascular;  Laterality: N/A;  .  Percutaneous coronary stent intervention (pci-s) N/A 06/13/2013    Procedure: PERCUTANEOUS CORONARY STENT INTERVENTION (PCI-S);  Surgeon: [name], MD;  Location: [facility] CATH LAB;  Service: Cardiovascular;  Laterality: N/A;  . Coronary artery bypass graft  1220/11    "CABG X5"  . Cholecystectomy  2004  . Hernia repair  2004    "in my stomach; had OR on it twice", wire mesh on 1 hernia  . Esophagogastroduodenoscopy (egd) with propofol N/A 03/11/2014    Procedure: ESOPHAGOGASTRODUODENOSCOPY (EGD) WITH PROPOFOL;  Surgeon: Lafayette Dragon, MD;  Location: [facility];  Service: Endoscopy;  Laterality: N/A;   Family History  Problem Relation Age of Onset  . Heart disease Father     Heart Disease before age 57  . Diabetes Father   . Hyperlipidemia Father   . Hypertension Father   . Heart attack Father   . Deep vein thrombosis Father   . Colon cancer Neg Hx   . Hypertension Mother   . Cancer Sister   . Heart disease Paternal Uncle   . Diabetes Paternal Uncle   . Heart attack Paternal Grandfather 55    died of MI at 63  . Diabetes Paternal Grandmother   . Diabetes Paternal Aunt   . Diabetes Paternal Uncle    History  Substance Use Topics  . Smoking status: Never Smoker   . Smokeless tobacco: Never Used  . Alcohol Use: No   OB History    No data available     Review of Systems  Constitutional: Negative for fever and chills.  Musculoskeletal: Positive for gait problem.  Skin: Positive for wound.  Neurological: Negative for numbness.      Allergies  Amoxicillin; Brilinta; Erythromycin; Penicillins; Metformin and related; Tape; and Isosorbide mononitrate  Home Medications   Prior to Admission medications   Medication Sig Start Date End Date Taking? Authorizing Provider  albuterol (PROVENTIL) (2.5 MG/3ML) 0.083% nebulizer solution Take 2.5 mg by nebulization every 6 (six) hours as needed for shortness of breath (when asthma is active).     Historical Provider, MD   albuterol (PROVENTIL,VENTOLIN) 90 MCG/ACT inhaler Inhale 2 puffs into the lungs every 6 (six) hours as needed for shortness of breath.     Historical Provider, MD  Alirocumab (PRALUENT) 75 MG/ML SOSY Inject 75 mg into the skin every 14 (fourteen) days. 02/12/14   Larey Dresser, MD  ALPRAZolam Duanne Moron) 0.5 MG tablet Take 0.5 mg by mouth 3 (three) times daily as needed for anxiety. For anxiety - Patient takes 1 in the  morning, then 1 in the evening, 2 at bedtime    Historical Provider, MD  amLODipine (NORVASC) 10 MG tablet Take 1 tablet (10 mg total) by mouth daily. Patient taking differently: Take 10 mg by mouth daily with breakfast.  10/04/12   Larey Dresser, MD  aspirin EC 81 MG tablet Take 81 mg by mouth daily.    Historical Provider, MD  b complex vitamins capsule Take 1 capsule by mouth daily.    Historical Provider, MD  Calcium Carbonate 1500 (600 CA) MG TABS Take 1,500 mg by mouth daily with breakfast.    Historical Provider, MD  Cholecalciferol (VITAMIN D) 2000 UNITS tablet Take 2,000 Units by mouth daily with breakfast.     Historical Provider, MD  cholestyramine (QUESTRAN) 4 GM/DOSE powder Take 2 g by mouth 2 (two) times daily with a meal. (mixed in juice or light kool-aid). 03/13/13   Larey Dresser, MD  ciprofloxacin (CIPRO) 500 MG tablet Take 1 tablet (500 mg total) by mouth 2 (two) times daily. 03/27/14   Verlyn Lambert Patel-Mills, PA-C  cloNIDine (CATAPRES) 0.2 MG tablet Take 0.2 mg by mouth 2 (two) times daily.    Historical Provider, MD  clopidogrel (PLAVIX) 75 MG tablet TAKE 2 TABS TONIGHT (150 TOTAL), AND THEN 75 MG THEREAFTER Patient taking differently: Take 75 mg by mouth at bedtime.  07/26/13   Larey Dresser, MD  DULoxetine (CYMBALTA) 60 MG capsule Take 60 mg by mouth daily with breakfast.    Historical Provider, MD  ezetimibe (ZETIA) 10 MG tablet Take 1 tablet (10 mg total) by mouth daily. Patient taking differently: Take 10 mg by mouth at bedtime.  08/02/13   Larey Dresser, MD   Fluticasone-Salmeterol (ADVAIR DISKUS) 250-50 MCG/DOSE AEPB Inhale 1 puff into the lungs every 12 (twelve) hours.     Historical Provider, MD  furosemide (LASIX) 40 MG tablet 1 tablet (40mg ) by mouth in the AM and 1/2 tablet (20mg ) by mouth in the PM Patient taking differently: Take 20-40 mg by mouth 2 (two) times daily. 1 tablet (40mg ) by mouth in the AM and 1/2 tablet (20mg ) by mouth in the PM 02/13/14   Larey Dresser, MD  gabapentin (NEURONTIN) 600 MG tablet Take 300-600 mg by mouth 3 (three) times daily. 1 tablet AM, 1 tablet at noon and 1 1/2 tablet at bedtime.    Historical Provider, MD  HUMALOG KWIKPEN 100 UNIT/ML SOPN Inject 10-14 Units into the skin 4 (four) times daily -  with meals and at bedtime. 10 units at every meal and 14 units at bedtime if needed 09/19/12   Historical Provider, MD  hydrALAZINE (APRESOLINE) 50 MG tablet Take 50 mg by mouth 3 (three) times daily as needed (for high blood pressure). If BP is >175    Historical Provider, MD  HYDROcodone-acetaminophen (NORCO/VICODIN) 5-325 MG per tablet Take 1 tablet by mouth at bedtime as needed.     Historical Provider, MD  insulin glargine (LANTUS) 100 UNIT/ML injection Inject 48 Units into the skin daily before breakfast.     Historical Provider, MD  ipratropium-albuterol (DUONEB) 0.5-2.5 (3) MG/3ML SOLN Take 3 mLs by nebulization as needed (If severe asthma , use every 4 hours for 10 days).     Historical Provider, MD  loperamide (IMODIUM) 2 MG capsule Take 2 mg by mouth as needed for diarrhea or loose stools.    Historical Provider, MD  losartan (COZAAR) 100 MG tablet Take 100 mg by mouth daily with breakfast.  Historical Provider, MD  Magnesium Oxide 250 MG TABS Take 250 mg by mouth 2 (two) times daily.     Historical Provider, MD  metoprolol tartrate (LOPRESSOR) 25 MG tablet Take 1 tablet (25 mg total) by mouth 2 (two) times daily. 02/13/14   Larey Dresser, MD  Multiple Vitamin (MULTIVITAMIN) tablet Take 1 tablet by mouth at  bedtime.     Historical Provider, MD  nitroGLYCERIN (NITROSTAT) 0.3 MG SL tablet Place 1 tablet (0.3 mg total) under the tongue every 5 (five) minutes x 3 doses as needed for chest pain. 02/28/14   Larey Dresser, MD  ondansetron (ZOFRAN) 4 MG tablet Take 1 tablet (4 mg total) by mouth 2 (two) times daily as needed. For nausea 02/12/14   Lafayette Dragon, MD  pantoprazole (PROTONIX) 40 MG tablet Take 1 tablet (40 mg total) by mouth 2 (two) times daily. 11/08/13   Larey Dresser, MD  potassium chloride SA (K-DUR,KLOR-CON) 20 MEQ tablet Take 20 mEq by mouth at bedtime.    Historical Provider, MD  ranolazine (RANEXA) 1000 MG SR tablet Take 1 tablet (1,000 mg total) by mouth 2 (two) times daily. 07/04/13   Liliane Shi, PA-C  rosuvastatin (CRESTOR) 40 MG tablet Take 1 tablet (40 mg total) by mouth daily. Patient taking differently: Take 40 mg by mouth at bedtime.  02/28/14   Larey Dresser, MD  sucralfate (CARAFATE) 1 GM/10ML suspension Take 10 mLs (1 g total) by mouth 2 (two) times daily. 02/12/14   Lafayette Dragon, MD  topiramate (TOPAMAX) 50 MG tablet Take 1 tablet (50 mg total) by mouth 2 (two) times daily. 01/09/13   Dennie Bible, NP  triamterene-hydrochlorothiazide (DYAZIDE) 37.5-25 MG per capsule Take 1 capsule by mouth at bedtime.  11/08/13   Larey Dresser, MD  zinc gluconate 50 MG tablet Take 50 mg by mouth at bedtime.     Historical Provider, MD  zolpidem (AMBIEN) 10 MG tablet Take 10 mg by mouth at bedtime as needed for sleep.     Historical Provider, MD   BP 189/66 mmHg  Pulse 72  Temp(Src) 97.7 F (36.5 C) (Oral)  Resp 18  Ht 5' 2.5" (1.588 m)  SpO2 96% Physical Exam  Constitutional: She appears well-developed and well-nourished.  Cardiovascular: Normal rate, regular rhythm and normal heart sounds.   Pulmonary/Chest: Effort normal and breath sounds normal.  Skin:  <1cm right plantar foot laceration. No active bleeding.       ED Course  FOREIGN BODY REMOVAL Date/Time:  03/27/2014 6:47 PM Performed by: Ottie Glazier Authorized by: Ottie Glazier Consent: Verbal consent obtained. Risks and benefits: risks, benefits and alternatives were discussed Consent given by: patient Patient understanding: patient states understanding of the procedure being performed Patient consent: the patient's understanding of the procedure matches consent given Site marked: the operative site was marked Imaging studies: imaging studies available Patient identity confirmed: verbally with patient Intake: right foot. Anesthesia: local infiltration Local anesthetic: lidocaine 2% with epinephrine Anesthetic total (ml): 37mL. Patient sedated: no Patient restrained: no Patient cooperative: yes 1 objects recovered. Objects recovered: glass Post-procedure assessment: foreign body removed Patient tolerance: Patient tolerated the procedure well with no immediate complications Comments: Cleaned puncture wound with saline and applied gauze.     (including critical care time) Labs Review Labs Reviewed - No data to display  Imaging Review Dg Foot Complete Right  03/27/2014   CLINICAL DATA:  Stepped on foreign body  EXAM: RIGHT FOOT COMPLETE -  3+ VIEW  COMPARISON:  None.  FINDINGS: No fracture or dislocation is seen.  Degenerative changes of the 1st MTP joint with hallux valgus deformity.  Postsurgical changes involving the distal tibia and fibula.  3 x 8 mm radiopaque foreign body along the plantar aspect of the midfoot, beneath the anterior calcaneus, visualized on the lateral view.  IMPRESSION: 3 x 8 mm radiopaque foreign body along the plantar aspect of the midfoot, beneath the anterior calcaneus, visualized on the lateral view.   Electronically Signed   By: Julian Hy M.D.   On: 03/27/2014 18:01     EKG Interpretation None      MDM  Due to location of puncture wound and patient's history of diabetes I will give antibiotics to cover pseudomonas. Discussed return  precautions with the patient such as fever, swelling, and erythema.  Final diagnoses:  Foreign body in foot, right, initial encounter        Ottie Glazier, PA-C 03/27/14 2009  Ezequiel Essex, MD 03/27/14 2350

## 2014-03-27 NOTE — Discharge Instructions (Signed)
1. Patient will go home with Ciprofloxacin.  Take home meds as prescribed. 2. Discussed reasons to return to the emergency department including swelling, redness, or fever.

## 2014-03-27 NOTE — ED Notes (Signed)
Pt comes in today with a c/o right foot injury. Pt states that she stepped onto something while walking today. Pt alert and oriented.

## 2014-04-11 ENCOUNTER — Telehealth: Payer: Self-pay | Admitting: Cardiology

## 2014-04-11 NOTE — Telephone Encounter (Signed)
Called Oral Surgeon's office and spoke with [employer] . Informed them that I can not find the surgical clearance form that they faxed over previously. Claiborne Billings will refax it at this time. I will place it in Dr. Claris Gladden box to be addressed tomorrow upon his return to clinic. Routed this message to Dr. [name] and Desiree Lucy, RN, also because the tooth is impacted and needs to come out as soon as possible. Thanks!

## 2014-04-11 NOTE — Telephone Encounter (Signed)
New Msg     Request for surgical clearance:  1. What type of surgery is being performed? Oral Surgery impacted wisdom tooth  2. When is this surgery scheduled? Waiting for clearance   3. Are there any medications that need to be held prior to surgery and how long? Plavix will need to be held. Can pt have anesthesia?  3. Name of physician performing surgery? Dr. Natividad Brood   4. What is your office phone and fax number? [phone], fax [phone]  Claiborne Billings calling from office wants to know if surgical clearance forms have been received?  Please call/fax info.

## 2014-04-12 NOTE — Telephone Encounter (Signed)
LMTCB

## 2014-04-12 NOTE — Telephone Encounter (Signed)
Claiborne Billings at Dr Darden Dates office aware of Dr Claris Gladden recommendations for Plavix.

## 2014-04-12 NOTE — Telephone Encounter (Signed)
She has been on Plavix for < 1 year after 4 DES placed in RCA (would be 5/16).  If this is deemed an emergent procedure and she must be off Plavix, would minimize time off Plavix.  She has been > 6 months on Plavix so risk is there but not as much as if it were < 6 months.

## 2014-05-01 ENCOUNTER — Other Ambulatory Visit: Payer: Self-pay | Admitting: Dermatology

## 2014-05-03 DIAGNOSIS — C4491 Basal cell carcinoma of skin, unspecified: Secondary | ICD-10-CM

## 2014-05-03 HISTORY — PX: BASAL CELL CARCINOMA EXCISION: SHX1214

## 2014-05-03 HISTORY — DX: Basal cell carcinoma of skin, unspecified: C44.91

## 2014-05-06 ENCOUNTER — Ambulatory Visit: Payer: Medicare Other | Admitting: Family

## 2014-05-06 ENCOUNTER — Other Ambulatory Visit (HOSPITAL_COMMUNITY): Payer: Medicare Other

## 2014-05-07 ENCOUNTER — Telehealth: Payer: Self-pay | Admitting: Cardiology

## 2014-05-07 NOTE — Telephone Encounter (Signed)
Pt states she has a wisdom tooth that is infected. Pt states she had a root canal about 2 weeks ago that has developed a fistula, pt states this is infected also.

## 2014-05-07 NOTE — Telephone Encounter (Signed)
Pt states Dr Louanne Skye is asking if pt can hold aspirin for procedure 05/21/14, but if Dr [name] does not think this is [facility] Dr Louanne Skye  can do the procedure without holding aspirin,but this not his first choice.   Pt states Dr Shireen Quan office will call tomorrow morning with this information and any requests Dr Louanne Skye has for procedure.

## 2014-05-07 NOTE — Telephone Encounter (Addendum)
New message     Pt want to tell the nurse about infection in her mouth.  I told her we cannot give her clearance or take a message regarding clearance.  She understood.  She will have Dr Shireen Quan office call or fax a clearance but she want to tell the nurse what is going on with infection in her mouth.

## 2014-05-08 NOTE — Telephone Encounter (Signed)
Follow up      What dental office are you calling from? Dr Dwaine Gale 1. What is your office phone and fax number? Fax [phone]  What type of procedure is the patient having performed? extract wisdom tooth, root amputation and exploratory surgery 2. What date is procedure scheduled? 05-21-14  3. What is your question (ex. Antibiotics prior to procedure, holding medication-we need to know how long dentist wants pt to hold med)?  Can she hold 81mg  aspirin 3-4 days prior to surgery

## 2014-05-08 NOTE — Telephone Encounter (Signed)
4 DES less than 1 year ago.  Would like Plavix continued until at least 6/16.   Ideally continue both ASA and Plavix but if ok to continue Plavix, could probably hold ASA prior to procedure.

## 2014-05-09 ENCOUNTER — Encounter: Payer: Self-pay | Admitting: Family

## 2014-05-09 NOTE — Telephone Encounter (Signed)
Pt notified, will forward information to Dr Dwaine Gale.

## 2014-05-10 ENCOUNTER — Encounter: Payer: Self-pay | Admitting: Family

## 2014-05-10 ENCOUNTER — Ambulatory Visit (INDEPENDENT_AMBULATORY_CARE_PROVIDER_SITE_OTHER): Payer: Medicare Other | Admitting: Family

## 2014-05-10 ENCOUNTER — Ambulatory Visit (HOSPITAL_COMMUNITY)
Admission: RE | Admit: 2014-05-10 | Discharge: 2014-05-10 | Disposition: A | Discharge status: Home / Self Care | Payer: Medicare Other | Source: Ambulatory Visit | Attending: Surgery | Admitting: Surgery

## 2014-05-10 VITALS — BP 130/70 | HR 48 | Resp 14 | Ht 62.5 in | Wt 277.0 lb

## 2014-05-10 DIAGNOSIS — I6521 Occlusion and stenosis of right carotid artery: Secondary | ICD-10-CM | POA: Insufficient documentation

## 2014-05-10 DIAGNOSIS — Z9889 Other specified postprocedural states: Secondary | ICD-10-CM | POA: Diagnosis not present

## 2014-05-10 DIAGNOSIS — Z48812 Encounter for surgical aftercare following surgery on the circulatory system: Secondary | ICD-10-CM | POA: Diagnosis not present

## 2014-05-10 DIAGNOSIS — Z8673 Personal history of transient ischemic attack (TIA), and cerebral infarction without residual deficits: Secondary | ICD-10-CM | POA: Insufficient documentation

## 2014-05-10 DIAGNOSIS — Z959 Presence of cardiac and vascular implant and graft, unspecified: Secondary | ICD-10-CM

## 2014-05-10 DIAGNOSIS — Z6841 Body Mass Index (BMI) 40.0 and over, adult: Secondary | ICD-10-CM | POA: Diagnosis not present

## 2014-05-10 DIAGNOSIS — I6523 Occlusion and stenosis of bilateral carotid arteries: Secondary | ICD-10-CM

## 2014-05-10 DIAGNOSIS — R252 Cramp and spasm: Secondary | ICD-10-CM | POA: Insufficient documentation

## 2014-05-10 NOTE — Progress Notes (Signed)
Established Carotid Patient   History of Present Illness  [name] [name] is a 69 y.o. female  patient of Dr. Trula Slade who is s/p left carotid stent placement on 03/20/2013 with distal embolic protection. This was done in the setting of a symptomatic left carotid stenosis. The patient was having nearly daily episodes of aphasia.  She returns today for follow up. Dr. Jannifer Franklin, her neurologist, requested a carotid Duplex that was performed on 09/27/13, copy of result reviewed which shows minimal bilateral ICA stenosis.   October 2015 stroke happened in cardiac rehab as manifested by could no longer stand up, feeling dizzy; saw Dr. Jannifer Franklin that day, had an MRI, pt states Dr. Jannifer Franklin told her that she had 2 more strokes. She continues to have intermittent expressive aphasia.  Pt states Dr. Jannifer Franklin is not sure of the etiology of her stokes, but states she has two brain aneurysms.   July, 2015 she had what she thinks was a stroke as manifested by feeling hot, off balance, headache, ataxia.  Dr. Jannifer Franklin requested an MRI of her brain to evaluate this and pt states she was told that she had another stroke; she still feels off balance, occasional headaches, occasional crawling sensation on left side of face with exertion, occasional mild expressive aphasia.  February, 2014 she lost vision in both eyes, her vision has since improved with some loss of acuity. She denies hemiparesis.  She has continued followup with Dr. Estanislado Pandy for vertebral artery stenosis as well as intracranial aneurysm.   She has two infected teeth that need dental surgery soon.   Pt reports New Medical or Surgical History: hospitalized 6 times at [facility] since February, 2015 for ischemic CAD, 4 stents placed; chest pain. Her cardiologist is Dr. [name].  She has a hx of bariatric surgery in the 1980's and has nausea and sometimes vomiting every morning on an empty stomach.  Pt Diabetic: Yes, pt states her January 2016 A1C was 6.7 or 6.5,  Dr. Buddy Duty is her endocrinologist.  Pt smoker: non-smoker  Pt meds include: Statin : Yes, also taking injectable Praluent for difficult to control cholesterol ASA: Yes Other anticoagulants/antiplatelets: Plavix  Past Medical History  Diagnosis Date  . Vomiting     persistent  . Asthma   . Ventral hernia     hx of  . GERD (gastroesophageal reflux disease)   . Obesity 01-2010  . Hyperlipidemia   . Hypertension   . Irregular heart beat   . Coronary artery disease   . Heart murmur   . Anxiety   . PONV (postoperative nausea and vomiting)   . Peripheral vascular disease     ? numbness, tingling arms and legs  . Anemia     hx  . H/O hiatal hernia   . Neuromuscular disorder     ?  Marland Kitchen Dysrhythmia   . Mental disorder     hx of confusion  . CHF (congestive heart failure)   . Anginal pain   . Pneumonia 2000's    "once"  . Chronic bronchitis     "off and on all the time" (07/18/2013)  . Claustrophobia   . Type II diabetes mellitus   . Chronic lower back pain   . Other and unspecified angina pectoris   . Obstructive sleep apnea     "can't wear machine; I have claustrophobia" (07/18/2013)  . Osteoarthritis     "knees and hands" (07/18/2013)  . Bundle branch block, left     chronic/notes 07/18/2013  .  Vomiting blood   . Myocardial infarction     04/1999, 02/2000, 01/2005; 2011; 2014  . Stroke 03/22/12    right side brain; denies residual on 07/18/2013)  . Shortness of breath     with exertion  . Chronic kidney disease     frequency, sses dr Jamal Maes every 4 to 6 months  . Migraine     "used to have them really bad; kind of eased up now; down to 1 q couple months" (07/18/2013)  . Headache     "probably 4 days/wk" (07/18/2013)  . Leg cramps     both legs at times    Social History History  Substance Use Topics  . Smoking status: Never Smoker   . Smokeless tobacco: Never Used  . Alcohol Use: No    Family History Family History  Problem Relation Age of Onset  .  Heart disease Father     Heart Disease before age 19  . Diabetes Father   . Hyperlipidemia Father   . Hypertension Father   . Heart attack Father   . Deep vein thrombosis Father   . Colon cancer Neg Hx   . Hypertension Mother   . Cancer Sister   . Heart disease Paternal Uncle   . Diabetes Paternal Uncle   . Heart attack Paternal Grandfather 55    died of MI at 84  . Diabetes Paternal Grandmother   . Diabetes Paternal Aunt   . Diabetes Paternal Uncle     Surgical History Past Surgical History  Procedure Laterality Date  . Tumor removal  1970's    ovarian  . Tibia fracture surgery Right 1970's    rods and pins  . Gastric bypass  1984    "stapeling"  . Total abdominal hysterectomy  1970's    w/ appendectomy  . Ankle fracture surgery Left 1970's  . Fracture surgery    . Esophagogastroduodenoscopy  08/03/2011    Procedure: ESOPHAGOGASTRODUODENOSCOPY (EGD);  Surgeon: Shann Medal, MD;  Location: [facility];  Service: General;  Laterality: N/A;  . Gall stone removal  05/2003  . Root canal  10/2000  . Appendectomy  1970's    w/hysterectomy  . Coronary angioplasty with stent placement  01,02,05,06,07,08,11; 04/24/2013    "I've probably got ~ 10 stents by now" (04/24/2013)  . Coronary angioplasty with stent placement  06/13/2013    "got 4 stents today" (06/13/2013)  . Cardiac catheterization  10/10/2012    Dr [name].  . Carotid endarterectomy Left 03/2013  . Cerebral angiogram N/A 04/05/2011    Procedure: CEREBRAL ANGIOGRAM;  Surgeon: Angelia Mould, MD;  Location: [facility] CATH LAB;  Service: Cardiovascular;  Laterality: N/A;  . Carotid stent insertion Left 03/20/2013    Procedure: CAROTID STENT INSERTION;  Surgeon: [name], MD;  Location: [facility] CATH LAB;  Service: Cardiovascular;  Laterality: Left;  internal carotid  . Percutaneous stent intervention N/A 04/24/2013    Procedure: PERCUTANEOUS STENT INTERVENTION;  Surgeon: [name], MD;  Location: [facility] CATH LAB;  Service:  Cardiovascular;  Laterality: N/A;  . Percutaneous coronary stent intervention (pci-s) N/A 06/13/2013    Procedure: PERCUTANEOUS CORONARY STENT INTERVENTION (PCI-S);  Surgeon: [name], MD;  Location: [facility] CATH LAB;  Service: Cardiovascular;  Laterality: N/A;  . Coronary artery bypass graft  1220/11    "CABG X5"  . Cholecystectomy  2004  . Hernia repair  2004    "in my stomach; had OR on it twice", wire mesh on 1 hernia  .  Esophagogastroduodenoscopy (egd) with propofol N/A 03/11/2014    Procedure: ESOPHAGOGASTRODUODENOSCOPY (EGD) WITH PROPOFOL;  Surgeon: Lafayette Dragon, MD;  Location: [facility] ENDOSCOPY;  Service: Endoscopy;  Laterality: N/A;    Allergies  Allergen Reactions  . Amoxicillin Shortness Of Breath and Rash  . Brilinta [Ticagrelor] Shortness Of Breath  . Erythromycin Shortness Of Breath and Other (See Comments)    Trouble swallowing  . Penicillins Shortness Of Breath and Rash  . Metformin And Related     Stomach pain, cold sweats, joint pain, burred vision, dizziness  . Tape Other (See Comments)    Plastic tape causes skin to rip if left on for long periods of time  . Isosorbide Mononitrate [Isosorbide]     Joint aches, muscles hurt, difficult to walk    Current Outpatient Prescriptions  Medication Sig Dispense Refill  . albuterol (PROVENTIL) (2.5 MG/3ML) 0.083% nebulizer solution Take 2.5 mg by nebulization every 6 (six) hours as needed for shortness of breath (when asthma is active).     Marland Kitchen albuterol (PROVENTIL,VENTOLIN) 90 MCG/ACT inhaler Inhale 2 puffs into the lungs every 6 (six) hours as needed for shortness of breath.     . Alirocumab (PRALUENT) 75 MG/ML SOSY Inject 75 mg into the skin every 14 (fourteen) days.    . ALPRAZolam (XANAX) 0.5 MG tablet Take 0.5 mg by mouth 3 (three) times daily as needed for anxiety. For anxiety - Patient takes 1 in the morning, then 1 in the evening, 2 at bedtime    . amLODipine (NORVASC) 10 MG tablet Take 1 tablet (10 mg total) by mouth  daily. (Patient taking differently: Take 10 mg by mouth daily with breakfast. ) 30 tablet 6  . aspirin EC 81 MG tablet Take 81 mg by mouth daily.    Marland Kitchen b complex vitamins capsule Take 1 capsule by mouth daily.    . Calcium Carbonate 1500 (600 CA) MG TABS Take 1,500 mg by mouth daily with breakfast.    . Cholecalciferol (VITAMIN D) 2000 UNITS tablet Take 2,000 Units by mouth daily with breakfast.     . cholestyramine (QUESTRAN) 4 GM/DOSE powder Take 2 g by mouth 2 (two) times daily with a meal. (mixed in juice or light kool-aid).    . ciprofloxacin (CIPRO) 500 MG tablet Take 1 tablet (500 mg total) by mouth 2 (two) times daily. 20 tablet 0  . cloNIDine (CATAPRES) 0.2 MG tablet Take 0.2 mg by mouth 2 (two) times daily.    . clopidogrel (PLAVIX) 75 MG tablet TAKE 2 TABS TONIGHT (150 TOTAL), AND THEN 75 MG THEREAFTER (Patient taking differently: Take 75 mg by mouth at bedtime. ) 90 tablet 3  . DULoxetine (CYMBALTA) 60 MG capsule Take 60 mg by mouth daily with breakfast.    . ezetimibe (ZETIA) 10 MG tablet Take 1 tablet (10 mg total) by mouth daily. (Patient taking differently: Take 10 mg by mouth at bedtime. ) 30 tablet 11  . Fluticasone-Salmeterol (ADVAIR DISKUS) 250-50 MCG/DOSE AEPB Inhale 1 puff into the lungs every 12 (twelve) hours.     . furosemide (LASIX) 40 MG tablet 1 tablet (40mg ) by mouth in the AM and 1/2 tablet (20mg ) by mouth in the PM (Patient taking differently: Take 20-40 mg by mouth 2 (two) times daily. 1 tablet (40mg ) by mouth in the AM and 1/2 tablet (20mg ) by mouth in the PM) 135 tablet 3  . gabapentin (NEURONTIN) 600 MG tablet Take 300-600 mg by mouth 3 (three) times daily. 1 tablet AM, 1  tablet at noon and 1 1/2 tablet at bedtime.    Marland Kitchen HUMALOG KWIKPEN 100 UNIT/ML SOPN Inject 10-14 Units into the skin 4 (four) times daily -  with meals and at bedtime. 10 units at every meal and 14 units at bedtime if needed    . hydrALAZINE (APRESOLINE) 50 MG tablet Take 50 mg by mouth 3 (three) times  daily as needed (for high blood pressure). If BP is >175    . HYDROcodone-acetaminophen (NORCO/VICODIN) 5-325 MG per tablet Take 1 tablet by mouth at bedtime as needed.     . insulin glargine (LANTUS) 100 UNIT/ML injection Inject 48 Units into the skin daily before breakfast.     . ipratropium-albuterol (DUONEB) 0.5-2.5 (3) MG/3ML SOLN Take 3 mLs by nebulization as needed (If severe asthma , use every 4 hours for 10 days).     Marland Kitchen loperamide (IMODIUM) 2 MG capsule Take 2 mg by mouth as needed for diarrhea or loose stools.    Marland Kitchen losartan (COZAAR) 100 MG tablet Take 100 mg by mouth daily with breakfast.     . Magnesium Oxide 250 MG TABS Take 250 mg by mouth 2 (two) times daily.     . metoprolol tartrate (LOPRESSOR) 25 MG tablet Take 1 tablet (25 mg total) by mouth 2 (two) times daily. 180 tablet 3  . Multiple Vitamin (MULTIVITAMIN) tablet Take 1 tablet by mouth at bedtime.     . nitroGLYCERIN (NITROSTAT) 0.3 MG SL tablet Place 1 tablet (0.3 mg total) under the tongue every 5 (five) minutes x 3 doses as needed for chest pain. 90 tablet 1  . ondansetron (ZOFRAN) 4 MG tablet Take 1 tablet (4 mg total) by mouth 2 (two) times daily as needed. For nausea 20 tablet 0  . pantoprazole (PROTONIX) 40 MG tablet Take 1 tablet (40 mg total) by mouth 2 (two) times daily. 180 tablet 3  . potassium chloride SA (K-DUR,KLOR-CON) 20 MEQ tablet Take 20 mEq by mouth at bedtime.    . ranolazine (RANEXA) 1000 MG SR tablet Take 1 tablet (1,000 mg total) by mouth 2 (two) times daily. 60 tablet 11  . rosuvastatin (CRESTOR) 40 MG tablet Take 1 tablet (40 mg total) by mouth daily. (Patient taking differently: Take 40 mg by mouth at bedtime. ) 90 tablet 3  . sucralfate (CARAFATE) 1 GM/10ML suspension Take 10 mLs (1 g total) by mouth 2 (two) times daily. 420 mL 1  . topiramate (TOPAMAX) 50 MG tablet Take 1 tablet (50 mg total) by mouth 2 (two) times daily. 60 tablet 6  . triamterene-hydrochlorothiazide (DYAZIDE) 37.5-25 MG per capsule  Take 1 capsule by mouth at bedtime.     Marland Kitchen zinc gluconate 50 MG tablet Take 50 mg by mouth at bedtime.     Marland Kitchen zolpidem (AMBIEN) 10 MG tablet Take 10 mg by mouth at bedtime as needed for sleep.      No current facility-administered medications for this visit.    Review of Systems : See HPI for pertinent positives and negatives.  Physical Examination  Filed Vitals:   05/10/14 1146 05/10/14 1152  BP: 106/60 130/70  Pulse: 48 48  Resp:  14  Height:  5' 2.5" (1.588 m)  Weight:  277 lb (125.646 kg)  SpO2:  94%   Body mass index is 49.83 kg/(m^2).   General: WDWN morbidly obese female in NAD GAIT: normal Eyes: PERRLA Pulmonary: Non-labored, CTAB, Negative Rales, Negative rhonchi, & Negative wheezing.  Cardiac: regular Rhythm, no detected murmur.  VASCULAR EXAM Carotid Bruits Right Left   Negative Negative   Radial pulses are 1+ palpable and equal.      LE Pulses Right Left   POPLITEAL not palpable  not palpable   POSTERIOR TIBIAL  palpable   palpable    DORSALIS PEDIS  ANTERIOR TIBIAL faintly palpable  faintly palpable     Gastrointestinal: soft, nontender, BS WNL, no r/g, no palpable masses.  Musculoskeletal: Negative muscle atrophy/wasting. M/S 4/5 throughout, Extremities without ischemic changes.  Neurologic: A&O X 3; Appropriate Affect, Speech is normal CN 2-12 intact, Pain and light touch intact in extremities, Motor exam as listed above.          Non-Invasive Vascular Imaging CAROTID DUPLEX 05/10/2014   CEREBROVASCULAR DUPLEX EVALUATION     INDICATION: Carotid artery stent    PREVIOUS INTERVENTION(S): Left carotid stent with distal embolic protection on 0000000    DUPLEX EXAM:     RIGHT  LEFT  Peak Systolic Velocities (cm/s) End Diastolic Velocities (cm/s) Plaque LOCATION  Peak Systolic Velocities (cm/s) End Diastolic Velocities (cm/s) Plaque  121 24  CCA PROXIMAL 85 20   82 20  CCA MID 79 23 HT  89 22 HT CCA DISTAL 71 18   325 35 HT ECA 243 27 HT  92 25 HT ICA PROXIMAL 99 31   91 32  ICA MID 85 28   88 32  ICA DISTAL 59 30     1.03 ICA / CCA Ratio (PSV) 1.4  Antegrade Vertebral Flow Antegrade  0000000 Brachial Systolic Pressure (mmHg) [id number]  Multiphasic (subclavian artery) Brachial Artery Waveforms Multiphasic (subclavian artery)    Peak Systolic Velocities (cm/s) End Diastolic Velocities (cm/s) Plaque STENT (  ) Peak Systolic Velocities (cm/s) End Diastolic Velocities (cm/s) Plaque     PROXIMAL 71 18      MID 80 23      DISTAL 99 31     Plaque Morphology:  HM = Homogeneous, HT = Heterogeneous, CP = Calcific Plaque, SP = Smooth Plaque, IP = Irregular Plaque    ADDITIONAL FINDINGS: Bilateral external carotid artery stenoses noted.    IMPRESSION: Patent left internal carotid artery stent with Doppler velocities suggestive of a less than 40% right proximal internal carotid artery stenosis and no left internal carotid artery stenosis.    Compared to the previous exam:  No significant change noted when compared to the previous exam on 04/23/13.      Assessment: JENNIPHER CHIULLI is a 69 y.o. female who is s/p left carotid stent placement on 03/20/2013 with distal embolic protection. This was done in the setting of a symptomatic left carotid stenosis. The patient was having nearly daily episodes of aphasia.  October 2015 stroke happened in cardiac rehab as manifested by could no longer stand up, feeling dizzy; saw Dr. Jannifer Franklin that day, had an MRI, pt states Dr. Jannifer Franklin told her that she had 2 more strokes. She continues to have intermittent expressive aphasia.  Pt states Dr. Jannifer Franklin is not sure of the etiology of her stokes, but states she has two brain aneurysms.  Today's carotid Duplex suggests a patent left internal carotid artery stent with Doppler velocities  suggestive of a less than 40% right proximal internal carotid artery stenosis and no left internal carotid artery stenosis. No significant change noted when compared to the previous exam on 04/23/13.   Plan: Follow-up in 1 year with Carotid Duplex.   I discussed in depth with the patient the nature of atherosclerosis, and  emphasized the importance of maximal medical management including strict control of blood pressure, blood glucose, and lipid levels, obtaining regular exercise, and continued cessation of smoking.  The patient is aware that without maximal medical management the underlying atherosclerotic disease process will progress, limiting the benefit of any interventions. The patient was given information about stroke prevention and what symptoms should prompt the patient to seek immediate medical care. Thank you for allowing [place] to participate in this patient's care.  Clemon Chambers, RN, MSN, FNP-C [facility] Office: [phone]  Clinic Physician: Bridgett Larsson  05/10/2014 11:54 AM

## 2014-05-10 NOTE — Patient Instructions (Signed)
Stroke Prevention Some medical conditions and behaviors are associated with an increased chance of having a stroke. You may prevent a stroke by making healthy choices and managing medical conditions. HOW CAN I REDUCE MY RISK OF HAVING A STROKE?   Stay physically active. Get at least 30 minutes of activity on most or all days.  Do not smoke. It may also be helpful to avoid exposure to secondhand smoke.  Limit alcohol use. Moderate alcohol use is considered to be:  No more than 2 drinks per day for men.  No more than 1 drink per day for nonpregnant women.  Eat healthy foods. This involves:  Eating 5 or more servings of fruits and vegetables a day.  Making dietary changes that address high blood pressure (hypertension), high cholesterol, diabetes, or obesity.  Manage your cholesterol levels.  Making food choices that are high in fiber and low in saturated fat, trans fat, and cholesterol may control cholesterol levels.  Take any prescribed medicines to control cholesterol as directed by your health care provider.  Manage your diabetes.  Controlling your carbohydrate and sugar intake is recommended to manage diabetes.  Take any prescribed medicines to control diabetes as directed by your health care provider.  Control your hypertension.  Making food choices that are low in salt (sodium), saturated fat, trans fat, and cholesterol is recommended to manage hypertension.  Take any prescribed medicines to control hypertension as directed by your health care provider.  Maintain a healthy weight.  Reducing calorie intake and making food choices that are low in sodium, saturated fat, trans fat, and cholesterol are recommended to manage weight.  Stop drug abuse.  Avoid taking birth control pills.  Talk to your health care provider about the risks of taking birth control pills if you are over 35 years old, smoke, get migraines, or have ever had a blood clot.  Get evaluated for sleep  disorders (sleep apnea).  Talk to your health care provider about getting a sleep evaluation if you snore a lot or have excessive sleepiness.  Take medicines only as directed by your health care provider.  For some people, aspirin or blood thinners (anticoagulants) are helpful in reducing the risk of forming abnormal blood clots that can lead to stroke. If you have the irregular heart rhythm of atrial fibrillation, you should be on a blood thinner unless there is a good reason you cannot take them.  Understand all your medicine instructions.  Make sure that other conditions (such as anemia or atherosclerosis) are addressed. SEEK IMMEDIATE MEDICAL CARE IF:   You have sudden weakness or numbness of the face, arm, or leg, especially on one side of the body.  Your face or eyelid droops to one side.  You have sudden confusion.  You have trouble speaking (aphasia) or understanding.  You have sudden trouble seeing in one or both eyes.  You have sudden trouble walking.  You have dizziness.  You have a loss of balance or coordination.  You have a sudden, severe headache with no known cause.  You have new chest pain or an irregular heartbeat. Any of these symptoms may represent a serious problem that is an emergency. Do not wait to see if the symptoms will go away. Get medical help at once. Call your local emergency services (911 in [place] Do not drive yourself to the hospital. Document Released: 02/26/2004 Document Revised: 06/04/2013 Document Reviewed: 07/21/2012 Exit[employer] Patient Information 2015 [employer]. This information is not intended to replace advice given   to you by your health care provider. Make sure you discuss any questions you have with your health care provider.  

## 2014-05-14 ENCOUNTER — Encounter: Payer: Self-pay | Admitting: Neurology

## 2014-05-14 ENCOUNTER — Ambulatory Visit (INDEPENDENT_AMBULATORY_CARE_PROVIDER_SITE_OTHER): Payer: Medicare Other | Admitting: Neurology

## 2014-05-14 VITALS — BP 156/63 | HR 57 | Ht 62.0 in | Wt 278.0 lb

## 2014-05-14 DIAGNOSIS — I671 Cerebral aneurysm, nonruptured: Secondary | ICD-10-CM | POA: Diagnosis not present

## 2014-05-14 DIAGNOSIS — I679 Cerebrovascular disease, unspecified: Secondary | ICD-10-CM | POA: Diagnosis not present

## 2014-05-14 DIAGNOSIS — R413 Other amnesia: Secondary | ICD-10-CM | POA: Diagnosis not present

## 2014-05-14 DIAGNOSIS — I6523 Occlusion and stenosis of bilateral carotid arteries: Secondary | ICD-10-CM

## 2014-05-14 DIAGNOSIS — R42 Dizziness and giddiness: Secondary | ICD-10-CM | POA: Diagnosis not present

## 2014-05-14 DIAGNOSIS — R269 Unspecified abnormalities of gait and mobility: Secondary | ICD-10-CM | POA: Diagnosis not present

## 2014-05-14 DIAGNOSIS — G43009 Migraine without aura, not intractable, without status migrainosus: Secondary | ICD-10-CM

## 2014-05-14 HISTORY — DX: Other amnesia: R41.3

## 2014-05-14 HISTORY — DX: Migraine without aura, not intractable, without status migrainosus: G43.009

## 2014-05-14 NOTE — Progress Notes (Signed)
Reason for visit: Dizziness  [name] [name] is an 69 y.o. female  History of present illness:  [name] [name] is a 69 year old right-handed white female with a history of diabetes, morbid obesity, cerebrovascular disease, migraine headaches, and a gait disturbance. The patient has continued to have brief episodes of sudden shock like dizziness that occur throughout the day. The patient indicates that with her migraine headache, she may have some increase in her dizziness as well, but the dizziness is not completely related to the headache. She may get about 2 migraine headaches a week associated with some tingling sensations on the left face, and then pain into the left temporal and occipital area. The patient has undergone MRI evaluation of the brain on 10/08/2013, and she was found to have a right occipital and temporal infarct that was new from the prior study. The patient has a history of a left PCOM aneurysm that is about 3 mm, this was last evaluated in 2014. The patient has indicated that she has fallen on 2 occasions within the last 2 months. She has some gait instability associated with the dizziness. The patient also reports mild memory issues. She has restless leg syndrome, and she is on gabapentin for this. She reports some new issues with discomfort at the left elbow and some numbness into the left arm. The patient denies any neck pain. She is on aspirin and Plavix currently, taking 81 mg of aspirin daily. The patient has not gained benefit with the dizziness on Topamax, but she indicates that Xanax may be somewhat beneficial. She returns to the office today for an evaluation.  Past Medical History  Diagnosis Date  . Vomiting     persistent  . Asthma   . Ventral hernia     hx of  . GERD (gastroesophageal reflux disease)   . Obesity 01-2010  . Hyperlipidemia   . Hypertension   . Irregular heart beat   . Coronary artery disease   . Heart murmur   . Anxiety   . PONV (postoperative  nausea and vomiting)   . Peripheral vascular disease     ? numbness, tingling arms and legs  . Anemia     hx  . H/O hiatal hernia   . Neuromuscular disorder     ?  Marland Kitchen Dysrhythmia   . Mental disorder     hx of confusion  . CHF (congestive heart failure)   . Anginal pain   . Pneumonia 2000's    "once"  . Chronic bronchitis     "off and on all the time" (07/18/2013)  . Claustrophobia   . Type II diabetes mellitus   . Chronic lower back pain   . Other and unspecified angina pectoris   . Obstructive sleep apnea     "can't wear machine; I have claustrophobia" (07/18/2013)  . Osteoarthritis     "knees and hands" (07/18/2013)  . Bundle branch block, left     chronic/notes 07/18/2013  . Vomiting blood   . Myocardial infarction     04/1999, 02/2000, 01/2005; 2011; 2014  . Shortness of breath     with exertion  . Chronic kidney disease     frequency, sses dr Jamal Maes every 4 to 6 months  . Migraine     "used to have them really bad; kind of eased up now; down to 1 q couple months" (07/18/2013)  . Headache     "probably 4 days/wk" (07/18/2013)  . Leg cramps  both legs at times  . Stroke 03/22/12    right side brain; denies residual on 07/18/2013)  . Stroke Oct. 2015    Affected pt.s balance  . Cancer April 2016    BCC,  Melanoma  . Common migraine 05/14/2014  . Abnormality of gait 05/14/2014  . Memory change 05/14/2014    Past Surgical History  Procedure Laterality Date  . Tumor removal  1970's    ovarian  . Tibia fracture surgery Right 1970's    rods and pins  . Gastric bypass  1984    "stapeling"  . Total abdominal hysterectomy  1970's    w/ appendectomy  . Ankle fracture surgery Left 1970's  . Fracture surgery    . Esophagogastroduodenoscopy  08/03/2011    Procedure: ESOPHAGOGASTRODUODENOSCOPY (EGD);  Surgeon: Shann Medal, MD;  Location: [facility];  Service: General;  Laterality: N/A;  . Gall stone removal  05/2003  . Root canal  10/2000  . Appendectomy  1970's      w/hysterectomy  . Coronary angioplasty with stent placement  01,02,05,06,07,08,11; 04/24/2013    "I've probably got ~ 10 stents by now" (04/24/2013)  . Coronary angioplasty with stent placement  06/13/2013    "got 4 stents today" (06/13/2013)  . Cardiac catheterization  10/10/2012    Dr [name].  . Carotid endarterectomy Left 03/2013  . Cerebral angiogram N/A 04/05/2011    Procedure: CEREBRAL ANGIOGRAM;  Surgeon: Angelia Mould, MD;  Location: [facility] C[facility] LAB;  Service: Cardiovascular;  Laterality: N/A;  . Carotid stent insertion Left 03/20/2013    Procedure: CAROTID STENT INSERTION;  Surgeon: [name], MD;  Location: [facility] CATH LAB;  Service: Cardiovascular;  Laterality: Left;  internal carotid  . Percutaneous stent intervention N/A 04/24/2013    Procedure: PERCUTANEOUS STENT INTERVENTION;  Surgeon: [name], MD;  Location: [facility] CATH LAB;  Service: Cardiovascular;  Laterality: N/A;  . Percutaneous coronary stent intervention (pci-s) N/A 06/13/2013    Procedure: PERCUTANEOUS CORONARY STENT INTERVENTION (PCI-S);  Surgeon: [name], MD;  Location: [facility] CATH LAB;  Service: Cardiovascular;  Laterality: N/A;  . Coronary artery bypass graft  1220/11    "CABG X5"  . Cholecystectomy  2004  . Hernia repair  2004    "in my stomach; had OR on it twice", wire mesh on 1 hernia  . Esophagogastroduodenoscopy (egd) with propofol N/A 03/11/2014    Procedure: ESOPHAGOGASTRODUODENOSCOPY (EGD) WITH PROPOFOL;  Surgeon: Lafayette Dragon, MD;  Location: [facility];  Service: Endoscopy;  Laterality: N/A;    Family History  Problem Relation Age of Onset  . Heart disease Father     Heart Disease before age 22  . Diabetes Father   . Hyperlipidemia Father   . Hypertension Father   . Heart attack Father   . Deep vein thrombosis Father   . Colon cancer Neg Hx   . Hypertension Mother   . Deep vein thrombosis Mother   . Cancer Sister     Ovarian  . Hypertension Sister   . Heart disease Paternal  Uncle   . Diabetes Paternal Uncle   . Heart attack Paternal Grandfather 57    died of MI at 48  . Diabetes Paternal Grandmother   . Diabetes Paternal Aunt   . Diabetes Paternal Uncle     Social history:  reports that she has never smoked. She has never used smokeless tobacco. She reports that she does not drink alcohol or use illicit drugs.    Allergies  Allergen Reactions  . Amoxicillin Shortness Of Breath and Rash  . Brilinta [Ticagrelor] Shortness Of Breath  . Erythromycin Shortness Of Breath and Other (See Comments)    Trouble swallowing  . Penicillins Shortness Of Breath and Rash  . Metformin And Related     Stomach pain, cold sweats, joint pain, burred vision, dizziness  . Tape Other (See Comments)    Plastic tape causes skin to rip if left on for long periods of time  . Isosorbide Mononitrate [Isosorbide]     Joint aches, muscles hurt, difficult to walk    Medications:  Prior to Admission medications   Medication Sig Start Date End Date Taking? Authorizing Provider  albuterol (PROVENTIL) (2.5 MG/3ML) 0.083% nebulizer solution Take 2.5 mg by nebulization every 6 (six) hours as needed for shortness of breath (when asthma is active).     Historical Provider, MD  albuterol (PROVENTIL,VENTOLIN) 90 MCG/ACT inhaler Inhale 2 puffs into the lungs every 6 (six) hours as needed for shortness of breath.     Historical Provider, MD  Alirocumab (PRALUENT) 75 MG/ML SOSY Inject 75 mg into the skin every 14 (fourteen) days. 02/12/14   Larey Dresser, MD  ALPRAZolam Duanne Moron) 0.5 MG tablet Take 0.5 mg by mouth 3 (three) times daily as needed for anxiety. For anxiety - Patient takes 1 in the morning, then 1 in the evening, 2 at bedtime    Historical Provider, MD  amLODipine (NORVASC) 10 MG tablet Take 1 tablet (10 mg total) by mouth daily. Patient taking differently: Take 10 mg by mouth daily with breakfast.  10/04/12   Larey Dresser, MD  aspirin EC 81 MG tablet Take 81 mg by mouth daily.     Historical Provider, MD  b complex vitamins capsule Take 1 capsule by mouth daily.    Historical Provider, MD  Calcium Carbonate 1500 (600 CA) MG TABS Take 1,500 mg by mouth daily with breakfast.    Historical Provider, MD  Cholecalciferol (VITAMIN D) 2000 UNITS tablet Take 2,000 Units by mouth daily with breakfast.     Historical Provider, MD  cholestyramine (QUESTRAN) 4 GM/DOSE powder Take 2 g by mouth 2 (two) times daily with a meal. (mixed in juice or light kool-aid). 03/13/13   Larey Dresser, MD  ciprofloxacin (CIPRO) 500 MG tablet Take 1 tablet (500 mg total) by mouth 2 (two) times daily. Patient not taking: Reported on 05/10/2014 03/27/14   Ottie Glazier, PA-C  cloNIDine (CATAPRES) 0.2 MG tablet Take 0.2 mg by mouth 2 (two) times daily.    Historical Provider, MD  clopidogrel (PLAVIX) 75 MG tablet TAKE 2 TABS TONIGHT (150 TOTAL), AND THEN 75 MG THEREAFTER 07/26/13   Larey Dresser, MD  DULoxetine (CYMBALTA) 60 MG capsule Take 60 mg by mouth daily with breakfast.    Historical Provider, MD  ezetimibe (ZETIA) 10 MG tablet Take 1 tablet (10 mg total) by mouth daily. Patient taking differently: Take 10 mg by mouth at bedtime.  08/02/13   Larey Dresser, MD  Fluticasone-Salmeterol (ADVAIR DISKUS) 250-50 MCG/DOSE AEPB Inhale 1 puff into the lungs every 12 (twelve) hours.     Historical Provider, MD  furosemide (LASIX) 40 MG tablet 1 tablet (40mg ) by mouth in the AM and 1/2 tablet (20mg ) by mouth in the PM Patient taking differently: Take 20-40 mg by mouth 2 (two) times daily. 1 tablet (40mg ) by mouth in the AM and 1/2 tablet (20mg ) by mouth in the PM 02/13/14   Larey Dresser,  MD  gabapentin (NEURONTIN) 600 MG tablet Take 300-600 mg by mouth 3 (three) times daily. 1 tablet AM, 1 tablet at noon and 1 1/2 tablet at bedtime.    Historical Provider, MD  HUMALOG KWIKPEN 100 UNIT/ML SOPN Inject 10-14 Units into the skin 4 (four) times daily -  with meals and at bedtime. 10 units at every meal and 14  units at bedtime if needed 09/19/12   Historical Provider, MD  hydrALAZINE (APRESOLINE) 50 MG tablet Take 50 mg by mouth 3 (three) times daily as needed (for high blood pressure). If BP is >175    Historical Provider, MD  HYDROcodone-acetaminophen (NORCO/VICODIN) 5-325 MG per tablet Take 1 tablet by mouth at bedtime as needed.     Historical Provider, MD  insulin glargine (LANTUS) 100 UNIT/ML injection Inject 48 Units into the skin daily before breakfast.     Historical Provider, MD  ipratropium-albuterol (DUONEB) 0.5-2.5 (3) MG/3ML SOLN Take 3 mLs by nebulization as needed (If severe asthma , use every 4 hours for 10 days).     Historical Provider, MD  loperamide (IMODIUM) 2 MG capsule Take 2 mg by mouth as needed for diarrhea or loose stools.    Historical Provider, MD  losartan (COZAAR) 100 MG tablet Take 100 mg by mouth daily with breakfast.     Historical Provider, MD  Magnesium Oxide 250 MG TABS Take 250 mg by mouth 2 (two) times daily.     Historical Provider, MD  metoprolol tartrate (LOPRESSOR) 25 MG tablet Take 1 tablet (25 mg total) by mouth 2 (two) times daily. 02/13/14   Larey Dresser, MD  Multiple Vitamin (MULTIVITAMIN) tablet Take 1 tablet by mouth at bedtime.     Historical Provider, MD  nitroGLYCERIN (NITROSTAT) 0.3 MG SL tablet Place 1 tablet (0.3 mg total) under the tongue every 5 (five) minutes x 3 doses as needed for chest pain. 02/28/14   Larey Dresser, MD  ondansetron (ZOFRAN) 4 MG tablet Take 1 tablet (4 mg total) by mouth 2 (two) times daily as needed. For nausea 02/12/14   Lafayette Dragon, MD  pantoprazole (PROTONIX) 40 MG tablet Take 1 tablet (40 mg total) by mouth 2 (two) times daily. 11/08/13   Larey Dresser, MD  potassium chloride SA (K-DUR,KLOR-CON) 20 MEQ tablet Take 20 mEq by mouth at bedtime.    Historical Provider, MD  ranolazine (RANEXA) 1000 MG SR tablet Take 1 tablet (1,000 mg total) by mouth 2 (two) times daily. 07/04/13   Liliane Shi, PA-C  rosuvastatin (CRESTOR)  40 MG tablet Take 1 tablet (40 mg total) by mouth daily. Patient taking differently: Take 40 mg by mouth at bedtime.  02/28/14   Larey Dresser, MD  sucralfate (CARAFATE) 1 GM/10ML suspension Take 10 mLs (1 g total) by mouth 2 (two) times daily. 02/12/14   Lafayette Dragon, MD  topiramate (TOPAMAX) 50 MG tablet Take 1 tablet (50 mg total) by mouth 2 (two) times daily. 01/09/13   Otilio Jefferson, NP  triamterene-hydrochlorothiazide (DYAZIDE) 37.5-25 MG per capsule Take 1 capsule by mouth at bedtime.  11/08/13   Larey Dresser, MD  zinc gluconate 50 MG tablet Take 50 mg by mouth at bedtime.     Historical Provider, MD  zolpidem (AMBIEN) 10 MG tablet Take 10 mg by mouth at bedtime as needed for sleep.     Historical Provider, MD    ROS:  Out of a complete 14 system review of symptoms, the patient  complains only of the following symptoms, and all other reviewed systems are negative.  Activity change Ringing in the ears, drooling Eye redness, loss of vision, blurred vision Chest pain, leg swelling, palpitations of the heart, heart murmur Cold intolerance, heat intolerance, flushing Swollen abdomen, nausea, vomiting Restless legs, insomnia, frequent waking Environmental allergies Urinary urgency Joint pain, back pain, muscle cramps, walking difficulty Moles Bruising easily Memory loss, dizziness, headache, numbness, speech difficulty, weakness Agitation, confusion, depression  Blood pressure 156/63, pulse 57, height 5\' 2"  (1.575 m), weight 278 lb (126.1 kg).  Physical Exam  General: The patient is alert and cooperative at the time of the examination. The patient is morbidly obese.  Skin: No significant peripheral edema is noted.   Neurologic Exam  Mental status: The patient is alert and oriented x 3 at the time of the examination. The patient has apparent normal recent and remote memory, with an apparently normal attention span and concentration ability.   Cranial nerves: Facial  symmetry is present. Speech is normal, no aphasia or dysarthria is noted. Extraocular movements are full. Visual fields are full.  Motor: The patient has good strength in all 4 extremities.  Sensory examination: Soft touch sensation is symmetric on the face, arms, and legs.  Coordination: The patient has good finger-nose-finger and heel-to-shin bilaterally.  Gait and station: The patient has a wide-based, unsteady gait. Tandem gait was not attempted. Romberg is negative, but is unsteady. No drift is seen.  Reflexes: Deep tendon reflexes are symmetric, but are depressed.   MRI brain 10/08/13:  IMPRESSION: Abnormal MRI scan of the brain showing remote age right occipital and medial temporal infarct which appears new compared with previous MRI scan and stable nonspecific left frontal periventricular white matter hyperintensity. No acute lesions are noted    Assessment/Plan:  1. Chronic episodic dizziness  2. Cerebrovascular disease, right occipital and temporal stroke event  3. Left PCOM aneurysm  4. Gait disturbance  5. Restless leg syndrome  6. Left arm numbness and discomfort  7. Migraine headache  8. Mild memory disturbance  9. Morbid obesity  10. Diabetes  The patient has multiple medical problems, with a very complex medical history. The patient is on a large amount of medications, and this may be the source of her dizziness. However, the patient has cerebrovascular disease, and she has had a stroke sometime within the last 2 years. The patient will be given a trial on meclizine in low dose to see if this helps the dizziness. The patient will be taken off of Topamax going to 50 g at night for 2 weeks, then stop the medication. Aspirin will be increased to 325 mg daily along with Plavix. The patient does have some left arm discomfort, this may represent a lateral epicondylitis. She will follow-up in about 6 months. We will check MRA of the head to reevaluate the cerebral  aneurysm.  Jill Alexanders MD 05/14/2014 8:24 PM  [facility] [address] [place], [place] [address]  Phone [phone] Fax [phone]

## 2014-05-14 NOTE — Patient Instructions (Addendum)
On aspirin taking 325 mg daily. May try meclizine for the dizziness, 12.5 mg 2 or 3 times daily. This is available over-the-counter. With the Topamax, go down to 50 mg at night for 2 weeks, then stop the medication.  Fall Prevention and Home Safety Falls cause injuries and can affect all age groups. It is possible to use preventive measures to significantly decrease the likelihood of falls. There are many simple measures which can make your home safer and prevent falls. OUTDOORS  Repair cracks and edges of walkways and driveways.  Remove high doorway thresholds.  Trim shrubbery on the main path into your home.  Have good outside lighting.  Clear walkways of tools, rocks, debris, and clutter.  Check that handrails are not broken and are securely fastened. Both sides of steps should have handrails.  Have leaves, snow, and ice cleared regularly.  Use sand or salt on walkways during winter months.  In the garage, clean up grease or oil spills. BATHROOM  Install night lights.  Install grab bars by the toilet and in the tub and shower.  Use non-skid mats or decals in the tub or shower.  Place a plastic non-slip stool in the shower to sit on, if needed.  Keep floors dry and clean up all water on the floor immediately.  Remove soap buildup in the tub or shower on a regular basis.  Secure bath mats with non-slip, double-sided rug tape.  Remove throw rugs and tripping hazards from the floors. BEDROOMS  Install night lights.  Make sure a bedside light is easy to reach.  Do not use oversized bedding.  Keep a telephone by your bedside.  Have a firm chair with side arms to use for getting dressed.  Remove throw rugs and tripping hazards from the floor. KITCHEN  Keep handles on pots and pans turned toward the center of the stove. Use back burners when possible.  Clean up spills quickly and allow time for drying.  Avoid walking on wet floors.  Avoid hot utensils and  knives.  Position shelves so they are not too high or low.  Place commonly used objects within easy reach.  If necessary, use a sturdy step stool with a grab bar when reaching.  Keep electrical cables out of the way.  Do not use floor polish or wax that makes floors slippery. If you must use wax, use non-skid floor wax.  Remove throw rugs and tripping hazards from the floor. STAIRWAYS  Never leave objects on stairs.  Place handrails on both sides of stairways and use them. Fix any loose handrails. Make sure handrails on both sides of the stairways are as long as the stairs.  Check carpeting to make sure it is firmly attached along stairs. Make repairs to worn or loose carpet promptly.  Avoid placing throw rugs at the top or bottom of stairways, or properly secure the rug with carpet tape to prevent slippage. Get rid of throw rugs, if possible.  Have an electrician put in a light switch at the top and bottom of the stairs. OTHER FALL PREVENTION TIPS  Wear low-heel or rubber-soled shoes that are supportive and fit well. Wear closed toe shoes.  When using a stepladder, make sure it is fully opened and both spreaders are firmly locked. Do not climb a closed stepladder.  Add color or contrast paint or tape to grab bars and handrails in your home. Place contrasting color strips on first and last steps.  Learn and use mobility  aids as needed. Install an electrical emergency response system.  Turn on lights to avoid dark areas. Replace light bulbs that burn out immediately. Get light switches that glow.  Arrange furniture to create clear pathways. Keep furniture in the same place.  Firmly attach carpet with non-skid or double-sided tape.  Eliminate uneven floor surfaces.  Select a carpet pattern that does not visually hide the edge of steps.  Be aware of all pets. OTHER HOME SAFETY TIPS  Set the water temperature for 120 F (48.8 C).  Keep emergency numbers on or near the  telephone.  Keep smoke detectors on every level of the home and near sleeping areas. Document Released: 01/08/2002 Document Revised: 07/20/2011 Document Reviewed: 04/09/2011 [facility] Patient Information 2015 [place], [place]. This information is not intended to replace advice given to you by your health care provider. Make sure you discuss any questions you have with your health care provider.

## 2014-05-15 ENCOUNTER — Other Ambulatory Visit (INDEPENDENT_AMBULATORY_CARE_PROVIDER_SITE_OTHER): Payer: Medicare Other | Admitting: *Deleted

## 2014-05-15 DIAGNOSIS — E785 Hyperlipidemia, unspecified: Secondary | ICD-10-CM

## 2014-05-15 LAB — HEPATIC FUNCTION PANEL
ALT: 19 U/L (ref 0–35)
AST: 14 U/L (ref 0–37)
Albumin: 4.2 g/dL (ref 3.5–5.2)
Alkaline Phosphatase: 77 U/L (ref 39–117)
Bilirubin, Direct: 0.1 mg/dL (ref 0.0–0.3)
Total Bilirubin: 0.7 mg/dL (ref 0.2–1.2)
Total Protein: 7.3 g/dL (ref 6.0–8.3)

## 2014-05-15 LAB — LIPID PANEL
Cholesterol: 141 mg/dL (ref 0–200)
HDL: 55.6 mg/dL (ref >39.00)
LDL Cholesterol: 50 mg/dL (ref 0–99)
NonHDL: 85.4
Total CHOL/HDL Ratio: 3
Triglycerides: 179 mg/dL — ABNORMAL HIGH (ref 0.0–149.0)
VLDL: 35.8 mg/dL (ref 0.0–40.0)

## 2014-05-16 ENCOUNTER — Telehealth: Payer: Self-pay | Admitting: Cardiology

## 2014-05-16 NOTE — Telephone Encounter (Signed)
Follow up      What dental office are you calling from? Dr Dwaine Gale 1. What is your office phone and fax number? Fax [phone]  What type of procedure is the patient having performed? extract wisdom tooth, root amputation and exploratory surgery 2. What date is procedure scheduled? 05-21-14  3. What is your question (ex. Antibiotics prior to procedure, holding medication-we need to know how long dentist wants pt to hold med)? Can she hold 81mg  aspirin 3-4 days prior to surgery

## 2014-05-16 NOTE — Telephone Encounter (Signed)
I will forward to Dr [name] to see if pt can resume ASA day after the procedure.

## 2014-05-16 NOTE — Telephone Encounter (Signed)
Yes resume after procedure.

## 2014-05-16 NOTE — Telephone Encounter (Signed)
New message         Office would like to know if it is ok for pt to start back on ASA the next day after procedure

## 2014-05-16 NOTE — Telephone Encounter (Signed)
msg left at office to resume day after procedure.

## 2014-05-17 ENCOUNTER — Encounter: Payer: Self-pay | Admitting: *Deleted

## 2014-05-17 ENCOUNTER — Telehealth: Payer: Self-pay | Admitting: *Deleted

## 2014-05-17 ENCOUNTER — Ambulatory Visit (INDEPENDENT_AMBULATORY_CARE_PROVIDER_SITE_OTHER): Payer: Medicare Other | Admitting: Pharmacist

## 2014-05-17 DIAGNOSIS — I6523 Occlusion and stenosis of bilateral carotid arteries: Secondary | ICD-10-CM | POA: Diagnosis not present

## 2014-05-17 DIAGNOSIS — E785 Hyperlipidemia, unspecified: Secondary | ICD-10-CM

## 2014-05-17 NOTE — Patient Instructions (Signed)
Your cholesterol looks great.    Keep taking your current medications.   If you have any problems, please call Gay Filler at [phone].    We will recheck your labs in 6 months.

## 2014-05-17 NOTE — Telephone Encounter (Signed)
Patient informed via voicemail and mychart.

## 2014-05-17 NOTE — Telephone Encounter (Signed)
-----   Message from Larey Dresser, MD sent at 05/16/2014  8:57 PM EDT ----- Doristine Devoid lipids

## 2014-05-17 NOTE — Progress Notes (Signed)
05/17/2014 [name] [name] 26-Oct-1945 [id number]  S/O: Ms. Tilmon is a 69yo female of Dr [name], who presents today for a 3 month follow-up for Praluent.  On 11/13/2013 she was started on Praluent 75mg  SubQ every 2 weeks using the PENS.  At her visit in January, her LDL had increased so there was question as to whether or not she was injecting the medication correctly.  She was changed to the syringe at that time and states she has had no issues since then and that it is much easier for her.    Ms Derring has a complex cardiac history that includes  CABG x 5 in Dec 2011, multiple PCIs prior to that and a CVA in Feb 2014.  She has been on ASA and clopidogrel, but recent carotid dopplers suggest worsening LICA stenosis to [id number].  Her most recent stenting in May of 2015 involved 4 overlapping stents in the RCA.  She had been on Effient, but given stroke history, was switched to [place], but this caused increased SOB.  She was then switched to clopidogrel.  In July 2015 she had episode of ataxia/imbalance, with questionable CVA.   She most recently had another episode of leg weakness and balance issues and Dr. Jannifer Franklin is concerned that she has had another event.  She is scheduled for an MRI next week.   Family history: father deceased MI at 46, PGF deceased MI at 75, MGF heart disease, multiple cousins with heart disease Social history: Denies alcohol use, denies tobacco use.   Exercise: Pt reports limited exercise currently d/t unstable gait 2/2 to previous stroke.    Diet: Pt has spoken with dietary at Cardiac rehab several times.  She primarily drinks diet flavored water, 1 coffee/day, and 1 diet soda/day    Breakfast - oatmeal, what toast w/ PB, fruit    Lunch - salad w/ non-fat dressing    Dinner - baked chicken or broiled fish 6x/week, lean red meat 1x/week, veggies    Snacks - denies snacking  RF: CHD, DM, HTN, age, family hx Meds:  Crestor 40 mg, Zetia 10 mg, Cholestyramine 2 gm, Praluent  75mg   Intolerant:  None  Labs: 05/2014: TC 141, HDL 56, LDL 50, TG 179, LFTs normal  02/2014:  TC 239, HDL 43, LDL 156, TG 209 11/2013: TC 185, HDL 53, LDL 95, TG 185 07/2013: TC 210, HDL 37, LDL 100, TG 363   Current Outpatient Prescriptions  Medication Sig Dispense Refill  . meclizine (ANTIVERT) 12.5 MG tablet Take 12.5 mg by mouth 3 (three) times daily as needed for dizziness.    Marland Kitchen acetaminophen (TYLENOL) 325 MG tablet Take 650 mg by mouth every 6 (six) hours as needed.    Marland Kitchen albuterol (PROVENTIL) (2.5 MG/3ML) 0.083% nebulizer solution Take 2.5 mg by nebulization every 6 (six) hours as needed for shortness of breath (when asthma is active).     Marland Kitchen albuterol (PROVENTIL,VENTOLIN) 90 MCG/ACT inhaler Inhale 2 puffs into the lungs every 6 (six) hours as needed for shortness of breath.     . Alirocumab (PRALUENT) 75 MG/ML SOSY Inject 75 mg into the skin every 14 (fourteen) days.    . ALPRAZolam (XANAX) 0.5 MG tablet Take 0.5 mg by mouth 3 (three) times daily as needed for anxiety. For anxiety - Patient takes 1 in the morning, then 1 in the evening, 2 at bedtime    . amLODipine (NORVASC) 10 MG tablet Take 1 tablet (10 mg total) by mouth daily. (Patient  taking differently: Take 10 mg by mouth daily with breakfast. ) 30 tablet 6  . aspirin 325 MG EC tablet Take 325 mg by mouth daily.    Marland Kitchen b complex vitamins capsule Take 1 capsule by mouth daily.    . Calcium Carbonate 1500 (600 CA) MG TABS Take 1,500 mg by mouth daily with breakfast.    . Cholecalciferol (VITAMIN D) 2000 UNITS tablet Take 2,000 Units by mouth daily with breakfast.     . cholestyramine (QUESTRAN) 4 GM/DOSE powder Take 2 g by mouth 2 (two) times daily with a meal. (mixed in juice or light kool-aid).    . cloNIDine (CATAPRES) 0.2 MG tablet Take 0.2 mg by mouth 2 (two) times daily.    . clopidogrel (PLAVIX) 75 MG tablet TAKE 2 TABS TONIGHT (150 TOTAL), AND THEN 75 MG THEREAFTER 90 tablet 3  . DULoxetine (CYMBALTA) 60 MG capsule Take 60 mg  by mouth daily with breakfast.    . ezetimibe (ZETIA) 10 MG tablet Take 1 tablet (10 mg total) by mouth daily. (Patient taking differently: Take 10 mg by mouth at bedtime. ) 30 tablet 11  . Fluticasone-Salmeterol (ADVAIR DISKUS) 250-50 MCG/DOSE AEPB Inhale 1 puff into the lungs every 12 (twelve) hours.     . furosemide (LASIX) 40 MG tablet 1 tablet (40mg ) by mouth in the AM and 1/2 tablet (20mg ) by mouth in the PM (Patient taking differently: Take 20-40 mg by mouth 2 (two) times daily. 1 tablet (40mg ) by mouth in the AM and 1/2 tablet (20mg ) by mouth in the PM) 135 tablet 3  . gabapentin (NEURONTIN) 600 MG tablet Take 300-600 mg by mouth 3 (three) times daily. 1 tablet AM, 1 tablet at noon and 1 1/2 tablet at bedtime.    Marland Kitchen HUMALOG KWIKPEN 100 UNIT/ML SOPN Inject 10-14 Units into the skin 4 (four) times daily -  with meals and at bedtime. 10 units at every meal and 14 units at bedtime if needed    . hydrALAZINE (APRESOLINE) 50 MG tablet Take 50 mg by mouth 3 (three) times daily as needed (for high blood pressure). If BP is >175    . HYDROcodone-acetaminophen (NORCO/VICODIN) 5-325 MG per tablet Take 1 tablet by mouth at bedtime as needed.     . insulin glargine (LANTUS) 100 UNIT/ML injection Inject 48 Units into the skin daily before breakfast.     . ipratropium-albuterol (DUONEB) 0.5-2.5 (3) MG/3ML SOLN Take 3 mLs by nebulization as needed (If severe asthma , use every 4 hours for 10 days).     Marland Kitchen loperamide (IMODIUM) 2 MG capsule Take 2 mg by mouth as needed for diarrhea or loose stools.    Marland Kitchen losartan (COZAAR) 100 MG tablet Take 100 mg by mouth daily with breakfast.     . Magnesium Oxide 250 MG TABS Take 250 mg by mouth 2 (two) times daily.     . metoprolol tartrate (LOPRESSOR) 25 MG tablet Take 1 tablet (25 mg total) by mouth 2 (two) times daily. 180 tablet 3  . Multiple Vitamin (MULTIVITAMIN) tablet Take 1 tablet by mouth at bedtime.     . nitroGLYCERIN (NITROSTAT) 0.3 MG SL tablet Place 1 tablet  (0.3 mg total) under the tongue every 5 (five) minutes x 3 doses as needed for chest pain. 90 tablet 1  . ondansetron (ZOFRAN) 4 MG tablet Take 1 tablet (4 mg total) by mouth 2 (two) times daily as needed. For nausea 20 tablet 0  . pantoprazole (PROTONIX) 40 MG  tablet Take 1 tablet (40 mg total) by mouth 2 (two) times daily. 180 tablet 3  . Probiotic Product (PROBIOTIC DAILY PO) Take 1 tablet by mouth 2 (two) times daily.    . ranolazine (RANEXA) 1000 MG SR tablet Take 1 tablet (1,000 mg total) by mouth 2 (two) times daily. 60 tablet 11  . rosuvastatin (CRESTOR) 40 MG tablet Take 1 tablet (40 mg total) by mouth daily. (Patient taking differently: Take 40 mg by mouth at bedtime. ) 90 tablet 3  . sucralfate (CARAFATE) 1 GM/10ML suspension Take 10 mLs (1 g total) by mouth 2 (two) times daily. 420 mL 1  . triamterene-hydrochlorothiazide (DYAZIDE) 37.5-25 MG per capsule Take 1 capsule by mouth at bedtime.     Marland Kitchen zinc gluconate 50 MG tablet Take 50 mg by mouth at bedtime.     Marland Kitchen zolpidem (AMBIEN) 10 MG tablet Take 10 mg by mouth at bedtime as needed for sleep.      No current facility-administered medications for this visit.    Allergies  Allergen Reactions  . Amoxicillin Shortness Of Breath and Rash  . Brilinta [Ticagrelor] Shortness Of Breath  . Erythromycin Shortness Of Breath and Other (See Comments)    Trouble swallowing  . Penicillins Shortness Of Breath and Rash  . Metformin And Related     Stomach pain, cold sweats, joint pain, burred vision, dizziness  . Tape Other (See Comments)    Plastic tape causes skin to rip if left on for long periods of time  . Isosorbide Mononitrate [Isosorbide]     Joint aches, muscles hurt, difficult to walk    A/P: Ms. Magallanes LDL is much better now that she is on Praluent 75mg  syringe rather than pen.  Her LDL has decreased >100 mg/dL.  She is tolerating the medication with no issues.  Will continue current dose.  Plan to recheck labs in 6 months and  follow up with patient over the phone.

## 2014-05-21 ENCOUNTER — Ambulatory Visit: Payer: Medicare Other | Admitting: Pharmacist

## 2014-06-01 ENCOUNTER — Ambulatory Visit
Admission: RE | Admit: 2014-06-01 | Discharge: 2014-06-01 | Disposition: A | Discharge status: Home / Self Care | Payer: Medicare Other | Source: Ambulatory Visit | Attending: Neurology | Admitting: Neurology

## 2014-06-01 DIAGNOSIS — I671 Cerebral aneurysm, nonruptured: Secondary | ICD-10-CM

## 2014-06-01 DIAGNOSIS — I679 Cerebrovascular disease, unspecified: Secondary | ICD-10-CM

## 2014-06-03 ENCOUNTER — Telehealth: Payer: Self-pay | Admitting: Neurology

## 2014-06-03 NOTE — Telephone Encounter (Signed)
I called patient. The left PCOM aneurysm is not visible on this study. There is atherosclerotic changes in the posterior circulation. The vertebral artery stenosis may be slightly worse as compared to prior study done in 2014. The patient is on aspirin, she needs to make sure that her blood pressures are under good control.   MRA 06/03/14:  IMPRESSION: This is an abnormal MRA of the intracranial arteries showing stenosis within the P1 segment of the posterior cerebral artery A1 segment of the left anterior cerebral artery and the right greater than left vertebral arteries. The extent of stenosis in the left P1 and A1 segments is similar to a prior MRA dated 03/10/2011, though the stenosis in the vertebral arteries appears to have progressed during the interim.

## 2014-06-05 ENCOUNTER — Other Ambulatory Visit: Payer: Self-pay | Admitting: Physician Assistant

## 2014-06-05 ENCOUNTER — Other Ambulatory Visit: Payer: Self-pay | Admitting: Cardiology

## 2014-06-18 ENCOUNTER — Encounter: Payer: Self-pay | Admitting: Neurology

## 2014-06-18 NOTE — Telephone Encounter (Signed)
I called the patient. I explained to her that per Dr. Jannifer Franklin' note he wanted her to take the aspirin she mentioned in her email and he wanted her to keep her blood pressure under good control. She stated that she sees two other doctors who monitor her blood pressure closely and she checks it at home. She stated it has been high lately (systolic [id number]) d/t the stress from the surgeries she has had. She promised she would notify her doctors if it remained high and did not go down to around 130/70, like it usually is.

## 2014-07-15 ENCOUNTER — Ambulatory Visit: Payer: Medicare Other | Admitting: Cardiology

## 2014-07-17 ENCOUNTER — Other Ambulatory Visit: Payer: Self-pay | Admitting: Physician Assistant

## 2014-08-23 ENCOUNTER — Other Ambulatory Visit: Payer: Self-pay | Admitting: Cardiology

## 2014-09-23 ENCOUNTER — Encounter: Payer: Self-pay | Admitting: Cardiology

## 2014-09-23 ENCOUNTER — Ambulatory Visit (INDEPENDENT_AMBULATORY_CARE_PROVIDER_SITE_OTHER): Payer: Medicare Other | Admitting: Cardiology

## 2014-09-23 VITALS — BP 158/79 | HR 58 | Ht 62.0 in | Wt 278.0 lb

## 2014-09-23 DIAGNOSIS — I5032 Chronic diastolic (congestive) heart failure: Secondary | ICD-10-CM | POA: Diagnosis not present

## 2014-09-23 DIAGNOSIS — I6523 Occlusion and stenosis of bilateral carotid arteries: Secondary | ICD-10-CM

## 2014-09-23 DIAGNOSIS — R002 Palpitations: Secondary | ICD-10-CM

## 2014-09-23 DIAGNOSIS — I10 Essential (primary) hypertension: Secondary | ICD-10-CM

## 2014-09-23 DIAGNOSIS — I2510 Atherosclerotic heart disease of native coronary artery without angina pectoris: Secondary | ICD-10-CM | POA: Diagnosis not present

## 2014-09-23 MED ORDER — FUROSEMIDE 40 MG PO TABS: 40.0000 mg | ORAL_TABLET | Freq: Two times a day (BID) | ORAL | Status: DC

## 2014-09-23 MED ORDER — RANOLAZINE ER 1000 MG PO TB12: ORAL_TABLET | ORAL | Status: DC

## 2014-09-23 NOTE — Patient Instructions (Signed)
Medication Instructions:  Increase lasix (furosemide) to 40mg  two times a day  Labwork: Your physician recommends that you return for lab work in: 2 weeks--BMET/BNP   Testing/Procedures: Your physician has recommended that you wear a holter monitor. Holter monitors are medical devices that record the heart's electrical activity. Doctors most often use these monitors to diagnose arrhythmias. Arrhythmias are problems with the speed or rhythm of the heartbeat. The monitor is a small, portable device. You can wear one while you do your normal daily activities. This is usually used to diagnose what is causing palpitations/syncope (passing out). [name] [name]: Your physician recommends that you schedule a follow-up appointment in: 6 weeks with Dr [name].    Any Other Special Instructions Will Be Listed Below (If Applicable).

## 2014-09-23 NOTE — Progress Notes (Signed)
Patient ID: [name] [name], female   DOB: Aug 21, 1945, 69 y.o.   [name]: [id number] PCP: Dr. Sandi Mariscal  69 yo with history of CAD s/p CABG, diastolic CHF, and cerebrovascular disease with history of CVA presents for cardiology followup. She had CABG x 5 in 12/11.  Prior to the CABG she had multiple PCIs.  She had a CVA in 2/14 that presented as visual loss.  She ended up getting a cerebral angiogram in 6/14 with 70% ostial right vertebral stenosis, > 70% stenosis proximal left posterior cerebral artery, and 123456 LICA stenosis.  She has been on ASA and Plavix.  She recently saw Dr. Scot Dock, and carotid dopplers suggested worsening of LICA stenosis to > [id number].  She has had episodes of transient expressive aphasia for several months.  She is planned for a carotid stent soon.  Given dyspnea and chest pain that was worsened from her baseline, I took [name] [name] for left heart catheterization in 9/14.  This showed patent SVG-D, LIMA-LAD, and sequential SVG-OM branches but SVG-RCA and the native RCA were both totally occluded.  I suspect that her increased symptoms coincided with occlusion of SVG-RCA.  I started her on Imdur to see if this would help with the chest pain and dyspnea.  However, she feels like Imdur causes leg cramps and does not think that she can take it.  I next started her on ranolazine 500 mg bid and titrated this up to 1000 mg bid.  This helped some but not markedly.  Therefore, I had her see Dr. Irish Lack to address opening RCA CTO. He was able to do this in 5/15 with 4 overlapping Xience DES.  This led to resolution of exertional chest pain.   [name] [name] was initially on Effient s/p PCI, but this was changed to Brilinta given stroke history.  She became much more short of breath after starting on Brilinta.  I had her stop Brilinta and replaced it with Plavix.  Dyspnea improved off Brilinta.   Recently, [name] [name] has been having trouble with dizziness.  She describes it as a spinning sensation, and it  seems to be triggered by head position.  Suspected vertigo, she has been trying meclizine without much relief.  She has atypical chest pain on occasion, not typically exertional.  She is short of breath after walking about 100 feet, this is worse.  She wakes up at night short of breath at times.  She has been sleeping sitting up for a long time. She has been having a lot of palpitations recently, describes this as "pounding."  It occurs daily.   Labs (6/14): K 3.8, creatinine 0.71 Labs (8/14): BNP 249, LDL 166 Labs (9/14): K 4.7, creatinine 0.7 Labs (9/14): K 4.6, creatinine 0.9, BNP 109 Labs (1/15): K 4.5, creatinine 0.73, LDL 212, HCT 43.4, TSH normal, BNP 138 Labs (6/15): K 4.7, creatinine 1.17, LDL 100, HDL 37, TGs 363, BNP 39 Labs (10/15): K 4.6, creatinine 1.2 Labs (1/16): LDL 157, HDL 43, K 5.2, creatinine 0.95 Labs (2/16): K 4.5, creatinine 1.08, BNP 113 Labs (4/16): LDL 50, HDL 56, TGs 179  ECG: NSR, LBBB  PMH: 1. Diabetic gastroparesis 2. Type II diabetes 3. HTN 4. Morbid obesity 5. CAD: s/p CABG in 12/11 after prior PCIs.  LIMA-LAD, SVG-D, seq SVG-OM1 and OM2, SVG-PDA. Adenosine Cardiolite (8/14) with EF 53% and a small reversible apical defect with a medium, partially reversible inferior defect.  LHC (9/14) with patent SVG-D, LIMA-LAD (50% distal LAD), and sequential SVG-OM branches;  the SVG-RCA and the native RCA were occluded.  This was managed medically initially, but with ongoing exertional chest pain, it was decided to open CTO.  Patient had CTO opening with 4 overlapping Xience DES in the RCA in 5/15.  .  6. Atypical migraines 7. OSA: Intolerant of CPAP.  8. GERD with hiatal hernia 9. OA 10. Diastolic CHF: Echo ([id number]) with EF 50-55%, grade II diastolic dysfunction, mild-moderate MR.  Echo (8/14) with EF 55-60%, grade II diastolic dysfunction, mildly increased aortic valve gradient (mean 12 mmHg) but valve opens well, mild MR and mild RV dilation.  Echo (9/15) with EF  60-65%, grade II diastolic dysfunction, mild aortic stenosis, mild mitral stenosis, mild to moderate mitral regurgitation, mildly dilated RV with normal systolic function, PA systolic pressure 46 mmHg.  11. CKD 12. Chronic LBBB 13. Anxiety 14. Carotid stenosis: Followed by [facility], [id number] LICA stenosis [id number].  She had left carotid stent in 2/15. Carotid dopplers 8/15 with no significant disease. Carotid dopplers (4/16): < 40% RICA stenosis.  15. Cerebrovascular disease: CVA 2/14 with right posterior cerebral artery territory ischemic infarction. Cerebral angiogram in 6/14 showed 70% right vertebral ostial stenosis, 123456 LICA stenosis, > [id number] proximal left posterior cerebral artery stenosis, posterior communicating artery aneurysm.  Patient has had episodes of transient expressive aphasia.  Carotid dopplers ([id number]) showed XX123456 LICA stenosis. She had a left carotid stent 2/15.  Possible CVA in 7/15.  16. Positional vertigo (suspected) 17. Palpitations: Holter (6/15) with rare PVCs/PACs.  18. Dyspnea with Brilinta. 19. Melanoma: On face, s/p excision.   20. Aortic stenosis: Mild.   SH: Married, nonsmoker  FH: CAD  ROS: All systems reviewed and negative except as per HPI.   Current Outpatient Prescriptions  Medication Sig Dispense Refill  . acetaminophen (TYLENOL) 325 MG tablet Take 650 mg by mouth every 6 (six) hours as needed.    Marland Kitchen albuterol (PROVENTIL) (2.5 MG/3ML) 0.083% nebulizer solution Take 2.5 mg by nebulization every 6 (six) hours as needed for shortness of breath (when asthma is active).     Marland Kitchen albuterol (PROVENTIL,VENTOLIN) 90 MCG/ACT inhaler Inhale 2 puffs into the lungs every 6 (six) hours as needed for shortness of breath.     . Alirocumab (PRALUENT) 75 MG/ML SOSY Inject 75 mg into the skin every 14 (fourteen) days.    . ALPRAZolam (XANAX) 0.5 MG tablet Take 0.5 mg by mouth 3 (three) times daily as needed for anxiety. For anxiety - Patient takes 1 in the morning, then 1 in the evening, 2  at bedtime    . amLODipine (NORVASC) 10 MG tablet Take 1 tablet (10 mg total) by mouth daily. (Patient taking differently: Take 10 mg by mouth daily with breakfast. ) 30 tablet 6  . aspirin 325 MG EC tablet Take 325 mg by mouth daily.    Marland Kitchen b complex vitamins capsule Take 1 capsule by mouth daily.    . Calcium Carbonate 1500 (600 CA) MG TABS Take 1,500 mg by mouth daily with breakfast.    . Cholecalciferol (VITAMIN D) 2000 UNITS tablet Take 2,000 Units by mouth daily with breakfast.     . cholestyramine (QUESTRAN) 4 GM/DOSE powder Take 2 g by mouth 2 (two) times daily with a meal. (mixed in juice or light kool-aid).    . cloNIDine (CATAPRES) 0.2 MG tablet Take 0.2 mg by mouth 2 (two) times daily.    . clopidogrel (PLAVIX) 75 MG tablet TAKE 2 TABS TONIGHT (150 TOTAL), AND THEN 75 MG THEREAFTER 90  tablet 3  . DULoxetine (CYMBALTA) 60 MG capsule Take 60 mg by mouth daily with breakfast.    . ezetimibe (ZETIA) 10 MG tablet Take 1 tablet (10 mg total) by mouth daily. (Patient taking differently: Take 10 mg by mouth at bedtime. ) 30 tablet 11  . Fluticasone-Salmeterol (ADVAIR DISKUS) 250-50 MCG/DOSE AEPB Inhale 1 puff into the lungs every 12 (twelve) hours.     . gabapentin (NEURONTIN) 600 MG tablet Take 300-600 mg by mouth 3 (three) times daily. 1 tablet AM, 1 tablet at noon and 1 1/2 tablet at bedtime.    Marland Kitchen HUMALOG KWIKPEN 100 UNIT/ML SOPN Inject 10-14 Units into the skin 4 (four) times daily -  with meals and at bedtime. 10 units at every meal and 14 units at bedtime if needed    . hydrALAZINE (APRESOLINE) 50 MG tablet Take 50 mg by mouth 3 (three) times daily as needed (for high blood pressure). If BP is >175    . HYDROcodone-acetaminophen (NORCO/VICODIN) 5-325 MG per tablet Take 1 tablet by mouth at bedtime as needed.     . insulin glargine (LANTUS) 100 UNIT/ML injection Inject 48 Units into the skin daily before breakfast.     . ipratropium-albuterol (DUONEB) 0.5-2.5 (3) MG/3ML SOLN Take 3 mLs by  nebulization as needed (If severe asthma , use every 4 hours for 10 days).     Marland Kitchen loperamide (IMODIUM) 2 MG capsule Take 2 mg by mouth as needed for diarrhea or loose stools.    Marland Kitchen losartan (COZAAR) 100 MG tablet Take 100 mg by mouth daily with breakfast.     . Magnesium Oxide 250 MG TABS Take 250 mg by mouth 2 (two) times daily.     . meclizine (ANTIVERT) 12.5 MG tablet Take 12.5 mg by mouth 3 (three) times daily as needed for dizziness.    . metoprolol tartrate (LOPRESSOR) 25 MG tablet Take 1 tablet (25 mg total) by mouth 2 (two) times daily. 180 tablet 3  . Multiple Vitamin (MULTIVITAMIN) tablet Take 1 tablet by mouth at bedtime.     . nitroGLYCERIN (NITROSTAT) 0.3 MG SL tablet Place 1 tablet (0.3 mg total) under the tongue every 5 (five) minutes x 3 doses as needed for chest pain. 90 tablet 1  . ondansetron (ZOFRAN) 4 MG tablet Take 1 tablet (4 mg total) by mouth 2 (two) times daily as needed. For nausea 20 tablet 0  . pantoprazole (PROTONIX) 40 MG tablet Take 1 tablet (40 mg total) by mouth 2 (two) times daily. 180 tablet 3  . Probiotic Product (PROBIOTIC DAILY PO) Take 1 tablet by mouth 2 (two) times daily.    . ranolazine (RANEXA) 1000 MG SR tablet TAKE ONE (1) TABLET BY MOUTH TWO (2) TIMES DAILY 60 each 6  . rosuvastatin (CRESTOR) 40 MG tablet Take 1 tablet (40 mg total) by mouth daily. (Patient taking differently: Take 40 mg by mouth at bedtime. ) 90 tablet 3  . sucralfate (CARAFATE) 1 GM/10ML suspension Take 10 mLs (1 g total) by mouth 2 (two) times daily. 420 mL 1  . triamterene-hydrochlorothiazide (DYAZIDE) 37.5-25 MG per capsule Take 1 capsule by mouth at bedtime.     Marland Kitchen zinc gluconate 50 MG tablet Take 50 mg by mouth at bedtime.     Marland Kitchen zolpidem (AMBIEN) 10 MG tablet Take 10 mg by mouth at bedtime as needed for sleep.     . furosemide (LASIX) 40 MG tablet Take 1 tablet (40 mg total) by mouth 2 (two)  times daily. 60 tablet 3   No current facility-administered medications for this visit.     BP 158/79 mmHg  Pulse 58  Ht 5\' 2"  (1.575 m)  Wt 278 lb (126.1 kg)  BMI 50.83 kg/m2  SpO2 95% General: NAD, obese Neck: JVP 8-9 cm, no thyromegaly or thyroid nodule.  Lungs: Occasional squeaks noted on lung exam.  CV: Nondisplaced PMI.  Heart regular S1/S2 with wide S2 split, no S3/S4, 1/6 early SEM.  1+ edema 1/2 up lower legs bilaterally.  No carotid bruit.  Normal pedal pulses.  Abdomen: Soft, nontender, no hepatosplenomegaly, no distention.  Skin: Intact without lesions or rashes.  Neurologic: Alert and oriented x 3.  Psych: Normal affect. Extremities: No clubbing or cyanosis.   Assessment/Plan: 1. CAD: Occluded native RCA and SVG-RCA.  Now status post opening of CTO RCA with 4 overlapping Xience DES.  Currently, she is having occasional atypical chest pain.   - Continue ASA, statin, losartan, Crestor, amlodipine, metoprolol, and ranolazine. I think that she can decrease ASA to 81 mg daily.  - She will continue Plavix long-term.   - She has not tolerated Imdur in the past.  2. Chronic diastolic CHF: She is volume overloaded (not markedly though) with NYHA class III symptoms.  - Increase Lasix to 40 mg bid.  - BMET/BNP in 10-14 days.    3. Hyperlipidemia: She remains on Zetia, Crestor and Praluent.  Excellent lipids in 4/16.   4. Hypertension: BP is controlled.  Continue current regimen.  5. Cerebrovascular disease: She has history of CVA and had left carotid stent in 2/15. Followed by [facility].  6. Dizziness: Sounds like positional vertigo.  She has a history of this.  I checked orthostatics today.  She was not orthostatic.  7. Palpitations: Bothersome, increased recently.  I will arrange for a 48 hour holter.  Followup in 6 wks.    Loralie Champagne 09/23/2014

## 2014-09-24 ENCOUNTER — Other Ambulatory Visit: Payer: Self-pay | Admitting: Cardiology

## 2014-09-25 ENCOUNTER — Ambulatory Visit (INDEPENDENT_AMBULATORY_CARE_PROVIDER_SITE_OTHER): Payer: Medicare Other

## 2014-09-25 DIAGNOSIS — I5032 Chronic diastolic (congestive) heart failure: Secondary | ICD-10-CM

## 2014-09-25 DIAGNOSIS — R002 Palpitations: Secondary | ICD-10-CM

## 2014-10-08 ENCOUNTER — Other Ambulatory Visit (INDEPENDENT_AMBULATORY_CARE_PROVIDER_SITE_OTHER): Payer: Medicare Other | Admitting: *Deleted

## 2014-10-08 DIAGNOSIS — I5032 Chronic diastolic (congestive) heart failure: Secondary | ICD-10-CM

## 2014-10-08 DIAGNOSIS — R002 Palpitations: Secondary | ICD-10-CM | POA: Diagnosis not present

## 2014-10-08 LAB — BASIC METABOLIC PANEL
BUN: 29 mg/dL — ABNORMAL HIGH (ref 6–23)
CO2: 31 mEq/L (ref 19–32)
Calcium: 9.5 mg/dL (ref 8.4–10.5)
Chloride: 101 mEq/L (ref 96–112)
Creatinine, Ser: 1.09 mg/dL (ref 0.40–1.20)
GFR: 53.24 mL/min — ABNORMAL LOW (ref >60.00)
Glucose, Bld: 147 mg/dL — ABNORMAL HIGH (ref 70–99)
Potassium: 5.3 mEq/L — ABNORMAL HIGH (ref 3.5–5.1)
Sodium: 138 mEq/L (ref 135–145)

## 2014-10-08 LAB — BRAIN NATRIURETIC PEPTIDE: Pro B Natriuretic peptide (BNP): 39 pg/mL (ref 0.0–100.0)

## 2014-10-08 NOTE — Addendum Note (Signed)
Addended by: Eulis Foster on: 10/08/2014 08:28 AM   Modules accepted: Orders

## 2014-10-23 ENCOUNTER — Other Ambulatory Visit: Payer: Self-pay | Admitting: Cardiology

## 2014-10-23 ENCOUNTER — Ambulatory Visit (INDEPENDENT_AMBULATORY_CARE_PROVIDER_SITE_OTHER): Payer: Medicare Other | Admitting: Cardiology

## 2014-10-23 ENCOUNTER — Encounter: Payer: Self-pay | Admitting: Cardiology

## 2014-10-23 VITALS — BP 126/70 | HR 74 | Ht 62.0 in | Wt 283.0 lb

## 2014-10-23 DIAGNOSIS — R002 Palpitations: Secondary | ICD-10-CM

## 2014-10-23 DIAGNOSIS — I6523 Occlusion and stenosis of bilateral carotid arteries: Secondary | ICD-10-CM

## 2014-10-23 DIAGNOSIS — I739 Peripheral vascular disease, unspecified: Secondary | ICD-10-CM

## 2014-10-23 DIAGNOSIS — E785 Hyperlipidemia, unspecified: Secondary | ICD-10-CM

## 2014-10-23 DIAGNOSIS — I2510 Atherosclerotic heart disease of native coronary artery without angina pectoris: Secondary | ICD-10-CM | POA: Diagnosis not present

## 2014-10-23 DIAGNOSIS — I671 Cerebral aneurysm, nonruptured: Secondary | ICD-10-CM

## 2014-10-23 LAB — BASIC METABOLIC PANEL
BUN: 29 mg/dL — ABNORMAL HIGH (ref 6–23)
CO2: 35 mEq/L — ABNORMAL HIGH (ref 19–32)
Calcium: 9.4 mg/dL (ref 8.4–10.5)
Chloride: 102 mEq/L (ref 96–112)
Creatinine, Ser: 1.29 mg/dL — ABNORMAL HIGH (ref 0.40–1.20)
GFR: 43.83 mL/min — ABNORMAL LOW (ref >60.00)
Glucose, Bld: 203 mg/dL — ABNORMAL HIGH (ref 70–99)
Potassium: 5.1 mEq/L (ref 3.5–5.1)
Sodium: 140 mEq/L (ref 135–145)

## 2014-10-23 MED ORDER — METOPROLOL SUCCINATE ER 25 MG PO TB24: 25.0000 mg | ORAL_TABLET | Freq: Two times a day (BID) | ORAL | Status: DC

## 2014-10-23 MED ORDER — ASPIRIN EC 81 MG PO TBEC: 81.0000 mg | DELAYED_RELEASE_TABLET | Freq: Every day | ORAL | Status: DC

## 2014-10-23 NOTE — Patient Instructions (Addendum)
Your physician has recommended you make the following change in your medication:  1.) STOP ASPIRIN 325 2.) START ASA 81 MG DAILY 3.) STOP METOPROLOL TARTRATE 4). START METOPROLOL SUCCINATE 25 MG TWICE DAILY  Your physician recommends that you return for lab work in: [place] (BMET)  Your physician has requested that you have a lower extremity arterial duplex. This test is an ultrasound of the arteries in the legs. It looks at arterial blood flow in the legs. Allow one hour for Lower  Arterial scans. There are no restrictions or special instructions. You have been referred to NUTRITION FOR WEIGHT LOSS.  Please call the office in a couple weeks with update on your palpitations.   Dr. [name] may want you to wear an event monitor if they are still bothersome.  Your physician recommends that you schedule a follow-up appointment in: 3 months with Dr. [name].

## 2014-10-24 NOTE — Progress Notes (Signed)
Patient ID: [name] [name], female   DOB: 1945-04-19, 69 y.o.   [name]: [id number] PCP: Dr. Sandi Mariscal  69 yo with history of CAD s/p CABG, diastolic CHF, and cerebrovascular disease with history of CVA presents for cardiology followup. She had CABG x 5 in 12/11.  Prior to the CABG she had multiple PCIs.  She had a CVA in 2/14 that presented as visual loss.  She had a left carotid stent in 2/15.   Given dyspnea and chest pain that was worsened from her baseline, I took [name] [name] for left heart catheterization in 9/14.  This showed patent SVG-D, LIMA-LAD, and sequential SVG-OM branches but SVG-RCA and the native RCA were both totally occluded.  I suspect that her increased symptoms coincided with occlusion of SVG-RCA.  I started her on Imdur to see if this would help with the chest pain and dyspnea.  However, she feels like Imdur causes leg cramps and does not think that she can take it.  I next started her on ranolazine 500 mg bid and titrated this up to 1000 mg bid.  This helped some but not markedly.  Therefore, I had her see Dr. Irish Lack to address opening RCA CTO. He was able to do this in 5/15 with 4 overlapping Xience DES.  This led to resolution of exertional chest pain.   [name] [name] was started on Brilinta after PCI.  She became much more short of breath after starting on Brilinta.  I had her stop Brilinta and replaced it with Plavix.  Dyspnea improved off Brilinta.   No chest pain.  She is short of breath after walking about 100 feet.  She wakes up at night short of breath at times.  She has been sleeping sitting up for a long time. She has been having a lot of palpitations recently, describes this as "pounding."  It occurs daily. I had her wear a holter monitor.  This showed no abnormalities, but she says that she did not have symptoms while wearing the holter.  She gets leg cramps and feels like her legs are weak.  She wants to lose weight.   Labs (6/14): K 3.8, creatinine 0.71 Labs (8/14): BNP 249,  LDL 166 Labs (9/14): K 4.7, creatinine 0.7 Labs (9/14): K 4.6, creatinine 0.9, BNP 109 Labs (1/15): K 4.5, creatinine 0.73, LDL 212, HCT 43.4, TSH normal, BNP 138 Labs (6/15): K 4.7, creatinine 1.17, LDL 100, HDL 37, TGs 363, BNP 39 Labs (10/15): K 4.6, creatinine 1.2 Labs (1/16): LDL 157, HDL 43, K 5.2, creatinine 0.95 Labs (2/16): K 4.5, creatinine 1.08, BNP 113 Labs (4/16): LDL 50, HDL 56, TGs 179 Labs (9/16): K 5.3, creatinine 1.09,   ECG: NSR, LBBB  PMH: 1. Diabetic gastroparesis 2. Type II diabetes 3. HTN 4. Morbid obesity 5. CAD: s/p CABG in 12/11 after prior PCIs.  LIMA-LAD, SVG-D, seq SVG-OM1 and OM2, SVG-PDA. Adenosine Cardiolite (8/14) with EF 53% and a small reversible apical defect with a medium, partially reversible inferior defect.  LHC (9/14) with patent SVG-D, LIMA-LAD (50% distal LAD), and sequential SVG-OM branches; the SVG-RCA and the native RCA were occluded.  This was managed medically initially, but with ongoing exertional chest pain, it was decided to open CTO.  Patient had CTO opening with 4 overlapping Xience DES in the RCA in 5/15.  .  6. Atypical migraines 7. OSA: Intolerant of CPAP.  8. GERD with hiatal hernia 9. OA 10. Diastolic CHF: Echo (A999333) with EF 50-55%, grade II diastolic  dysfunction, mild-moderate MR.  Echo (8/14) with EF 55-60%, grade II diastolic dysfunction, mildly increased aortic valve gradient (mean 12 mmHg) but valve opens well, mild MR and mild RV dilation.  Echo (9/15) with EF 60-65%, grade II diastolic dysfunction, mild aortic stenosis, mild mitral stenosis, mild to moderate mitral regurgitation, mildly dilated RV with normal systolic function, PA systolic pressure 46 mmHg.  11. CKD 12. Chronic LBBB 13. Anxiety 14. Carotid stenosis: Followed by [facility], [id number] LICA stenosis [id number].  She had left carotid stent in 2/15. Carotid dopplers 8/15 with no significant disease. Carotid dopplers (4/16): < 40% RICA stenosis.  15. Cerebrovascular disease: CVA  2/14 with right posterior cerebral artery territory ischemic infarction. Cerebral angiogram in 6/14 showed 70% right vertebral ostial stenosis, 123456 LICA stenosis, > [id number] proximal left posterior cerebral artery stenosis, posterior communicating artery aneurysm.  Patient has had episodes of transient expressive aphasia.  Carotid dopplers ([id number]) showed [id number] LICA stenosis. She had a left carotid stent 2/15.  Possible CVA in 7/15.  16. Positional vertigo (suspected) 17. Palpitations: Holter (6/15) with rare PVCs/PACs.  18. Dyspnea with Brilinta. 19. Melanoma: On face, s/p excision.   20. Aortic stenosis: Mild.  21. Holter (8/16) with no significant arrhythmias.   SH: Married, nonsmoker  FH: CAD  ROS: All systems reviewed and negative except as per HPI.   Current Outpatient Prescriptions  Medication Sig Dispense Refill  . acetaminophen (TYLENOL) 325 MG tablet Take 650 mg by mouth every 6 (six) hours as needed.    Marland Kitchen albuterol (PROVENTIL) (2.5 MG/3ML) 0.083% nebulizer solution Take 2.5 mg by nebulization every 6 (six) hours as needed for shortness of breath (when asthma is active).     Marland Kitchen albuterol (PROVENTIL,VENTOLIN) 90 MCG/ACT inhaler Inhale 2 puffs into the lungs every 6 (six) hours as needed for shortness of breath.     . Alirocumab (PRALUENT) 75 MG/ML SOSY Inject 75 mg into the skin every 14 (fourteen) days.    . ALPRAZolam (XANAX) 0.5 MG tablet Take 0.5 mg by mouth 3 (three) times daily as needed for anxiety. For anxiety - Patient takes 1 in the morning, then 1 in the evening, 2 at bedtime    . amLODipine (NORVASC) 10 MG tablet Take 1 tablet (10 mg total) by mouth daily. (Patient taking differently: Take 10 mg by mouth daily with breakfast. ) 30 tablet 6  . b complex vitamins capsule Take 1 capsule by mouth daily.    . Calcium Carbonate 1500 (600 CA) MG TABS Take 1,500 mg by mouth daily with breakfast.    . Cholecalciferol (VITAMIN D) 2000 UNITS tablet Take 2,000 Units by mouth daily with  breakfast.     . cholestyramine (QUESTRAN) 4 GM/DOSE powder Take 2 g by mouth 2 (two) times daily with a meal. (mixed in juice or light kool-aid).    . cloNIDine (CATAPRES) 0.2 MG tablet Take 0.2 mg by mouth 2 (two) times daily.    . clopidogrel (PLAVIX) 75 MG tablet TAKE ONE (1) TABLET BY MOUTH EVERY DAY 90 tablet 0  . DULoxetine (CYMBALTA) 60 MG capsule Take 60 mg by mouth daily with breakfast.    . ezetimibe (ZETIA) 10 MG tablet Take 1 tablet (10 mg total) by mouth daily. (Patient taking differently: Take 10 mg by mouth at bedtime. ) 30 tablet 11  . Fluticasone-Salmeterol (ADVAIR DISKUS) 250-50 MCG/DOSE AEPB Inhale 1 puff into the lungs every 12 (twelve) hours.     . furosemide (LASIX) 40 MG tablet Take 1 tablet (40  mg total) by mouth 2 (two) times daily. 60 tablet 3  . gabapentin (NEURONTIN) 600 MG tablet Take 300-600 mg by mouth 3 (three) times daily. 1 tablet AM, 1 tablet at noon and 1 1/2 tablet at bedtime.    Marland Kitchen HUMALOG KWIKPEN 100 UNIT/ML SOPN Inject 10-14 Units into the skin 4 (four) times daily -  with meals and at bedtime. 10 units at every meal and 14 units at bedtime if needed    . hydrALAZINE (APRESOLINE) 50 MG tablet Take 50 mg by mouth 3 (three) times daily as needed (for high blood pressure). If BP is >175    . HYDROcodone-acetaminophen (NORCO/VICODIN) 5-325 MG per tablet Take 1 tablet by mouth at bedtime as needed.     Marland Kitchen ipratropium-albuterol (DUONEB) 0.5-2.5 (3) MG/3ML SOLN Take 3 mLs by nebulization as needed (If severe asthma , use every 4 hours for 10 days).     Marland Kitchen LANTUS SOLOSTAR 100 UNIT/ML Solostar Pen 44 Units daily. Increase/decrease 4 units sugar changes.    Marland Kitchen loperamide (IMODIUM) 2 MG capsule Take 2 mg by mouth as needed for diarrhea or loose stools.    Marland Kitchen losartan (COZAAR) 100 MG tablet Take 100 mg by mouth daily with breakfast.     . Magnesium Oxide 250 MG TABS Take 250 mg by mouth 2 (two) times daily.     . meclizine (ANTIVERT) 12.5 MG tablet Take 12.5 mg by mouth 3  (three) times daily as needed for dizziness.    . Multiple Vitamin (MULTIVITAMIN) tablet Take 1 tablet by mouth at bedtime.     . nitroGLYCERIN (NITROSTAT) 0.3 MG SL tablet Place 1 tablet (0.3 mg total) under the tongue every 5 (five) minutes x 3 doses as needed for chest pain. 90 tablet 1  . ondansetron (ZOFRAN) 4 MG tablet Take 1 tablet (4 mg total) by mouth 2 (two) times daily as needed. For nausea 20 tablet 0  . pantoprazole (PROTONIX) 40 MG tablet Take 1 tablet (40 mg total) by mouth 2 (two) times daily. 180 tablet 3  . Probiotic Product (PROBIOTIC DAILY PO) Take 1 tablet by mouth 2 (two) times daily.    . ranolazine (RANEXA) 1000 MG SR tablet TAKE ONE (1) TABLET BY MOUTH TWO (2) TIMES DAILY 60 each 6  . rosuvastatin (CRESTOR) 40 MG tablet Take 1 tablet (40 mg total) by mouth daily. (Patient taking differently: Take 40 mg by mouth at bedtime. ) 90 tablet 3  . triamterene-hydrochlorothiazide (DYAZIDE) 37.5-25 MG per capsule Take 1 capsule by mouth at bedtime.     Marland Kitchen zinc gluconate 50 MG tablet Take 50 mg by mouth at bedtime.     Marland Kitchen zolpidem (AMBIEN) 10 MG tablet Take 10 mg by mouth at bedtime as needed for sleep.     Marland Kitchen aspirin EC 81 MG tablet Take 1 tablet (81 mg total) by mouth daily.    . metoprolol succinate (TOPROL XL) 25 MG 24 hr tablet Take 1 tablet (25 mg total) by mouth 2 (two) times daily. 60 tablet 6   No current facility-administered medications for this visit.    BP 126/70 mmHg  Pulse 74  Ht 5\' 2"  (1.575 m)  Wt 283 lb (128.368 kg)  BMI 51.75 kg/m2 General: NAD, obese Neck: JVP 7 cm, no thyromegaly or thyroid nodule.  Lungs: Occasional squeaks noted on lung exam.  CV: Nondisplaced PMI.  Heart regular S1/S2 with wide S2 split, no S3/S4, 1/6 early SEM.  1+ edema 1/3 up lower legs bilaterally.  Slight right carotid bruit.  Normal pedal pulses.  Abdomen: Soft, nontender, no hepatosplenomegaly, no distention.  Skin: Intact without lesions or rashes.  Neurologic: Alert and oriented  x 3.  Psych: Normal affect. Extremities: No clubbing or cyanosis.   Assessment/Plan: 1. CAD: Occluded native RCA and SVG-RCA.  Now status post opening of CTO RCA with 4 overlapping Xience DES.  Currently, she is not having any significant chest pain.   - Continue ASA, statin, losartan, Crestor, amlodipine, metoprolol, and ranolazine. I think that she can decrease ASA to 81 mg daily.  - She will continue Plavix long-term.   - She has not tolerated Imdur in the past.  2. Chronic diastolic CHF: Lasix was increased at last appointment and she looks euvolemic.  Breathing is unchanged, still NYHA class III. - Continue Lasix to 40 mg bid, BMET today.    3. Hyperlipidemia: She remains on Zetia, Crestor and Praluent.  Excellent lipids in 4/16.   4. Hypertension: BP is controlled.  Continue current regimen.  5. Cerebrovascular disease: She has history of CVA and had left carotid stent in 2/15. Followed by [facility].  6. Palpitations: Bothersome, increased recently.  Holter did not show an etiology. - Stop metoprolol, start Toprol XL 25 mg bid to see if this helps suppress palpitations (longer lasting effect).  - If she continues to have symptomatic palpitations, we could put a 2-3 week event monitor on her.   7. Leg pain: With and without exertion.  Weak PT pulses.  I will arrange for peripheral arterial dopplers.   8. Obesity: She is motivated to work on weight loss.  Will refer to nutritionist.   Followup in 3 months.    Loralie Champagne 10/24/2014

## 2014-10-29 ENCOUNTER — Ambulatory Visit (HOSPITAL_COMMUNITY)
Admission: RE | Admit: 2014-10-29 | Discharge: 2014-10-29 | Disposition: A | Discharge status: Home / Self Care | Payer: Medicare Other | Source: Ambulatory Visit | Attending: Internal Medicine | Admitting: Internal Medicine

## 2014-10-29 DIAGNOSIS — N189 Chronic kidney disease, unspecified: Secondary | ICD-10-CM | POA: Diagnosis not present

## 2014-10-29 DIAGNOSIS — E785 Hyperlipidemia, unspecified: Secondary | ICD-10-CM | POA: Insufficient documentation

## 2014-10-29 DIAGNOSIS — R002 Palpitations: Secondary | ICD-10-CM | POA: Insufficient documentation

## 2014-10-29 DIAGNOSIS — E119 Type 2 diabetes mellitus without complications: Secondary | ICD-10-CM | POA: Diagnosis not present

## 2014-10-29 DIAGNOSIS — I739 Peripheral vascular disease, unspecified: Secondary | ICD-10-CM | POA: Insufficient documentation

## 2014-10-29 DIAGNOSIS — I129 Hypertensive chronic kidney disease with stage 1 through stage 4 chronic kidney disease, or unspecified chronic kidney disease: Secondary | ICD-10-CM | POA: Diagnosis not present

## 2014-11-04 ENCOUNTER — Ambulatory Visit: Payer: Medicare Other | Admitting: Family

## 2014-11-04 ENCOUNTER — Other Ambulatory Visit (HOSPITAL_COMMUNITY): Payer: Medicare Other

## 2014-11-08 ENCOUNTER — Ambulatory Visit: Payer: Medicare Other | Admitting: Cardiology

## 2014-11-13 ENCOUNTER — Ambulatory Visit (INDEPENDENT_AMBULATORY_CARE_PROVIDER_SITE_OTHER): Payer: Medicare Other | Admitting: Neurology

## 2014-11-13 ENCOUNTER — Other Ambulatory Visit (INDEPENDENT_AMBULATORY_CARE_PROVIDER_SITE_OTHER): Payer: Medicare Other

## 2014-11-13 ENCOUNTER — Encounter: Payer: Self-pay | Admitting: Neurology

## 2014-11-13 VITALS — BP 160/75 | HR 57 | Ht 62.0 in | Wt 286.0 lb

## 2014-11-13 DIAGNOSIS — I6523 Occlusion and stenosis of bilateral carotid arteries: Secondary | ICD-10-CM | POA: Diagnosis not present

## 2014-11-13 DIAGNOSIS — R269 Unspecified abnormalities of gait and mobility: Secondary | ICD-10-CM | POA: Diagnosis not present

## 2014-11-13 DIAGNOSIS — R42 Dizziness and giddiness: Secondary | ICD-10-CM

## 2014-11-13 DIAGNOSIS — I10 Essential (primary) hypertension: Secondary | ICD-10-CM | POA: Diagnosis not present

## 2014-11-13 NOTE — Progress Notes (Signed)
Reason for visit: Dizziness  [name] [name] is an 69 y.o. female  History of present illness:  Ms. Knierim is a 69 year old right-handed white female with a history of morbid obesity, diabetes, diabetic peripheral neuropathy, gait disturbance, cerebrovascular disease, and episodic dizziness. The patient has had a new type of dizziness recently that is associated with standing up. The patient may get a whirley-headed sensation, dimming of vision, without associated syncope. The patient has not fallen because of the events. The patient continues to have her usual brief shocklike episodes of dizziness that occur once or twice a week. The patient has gone off of Topamax recently. She recently had an infected wisdom tooth that required antibiotics. The patient also has had some issues with a melanoma that was resected from the left shoulder, basal cell carcinoma resections. The patient does have some gait instability, she does not use a cane or walker. The patient will have intermittent numbness of both arms that occurs 2 or 3 times a day. The patient has a lot of neck and shoulder discomfort, some cervicogenic headaches. She returns to this office for an evaluation. MRI of the cervical spine done in 2013 did show multilevel cervical spondylosis.  Past Medical History  Diagnosis Date  . Vomiting     persistent  . Asthma   . Ventral hernia     hx of  . GERD (gastroesophageal reflux disease)   . Obesity 01-2010  . Hyperlipidemia   . Hypertension   . Irregular heart beat   . Coronary artery disease   . Heart murmur   . Anxiety   . PONV (postoperative nausea and vomiting)   . Peripheral vascular disease (HCC)     ? numbness, tingling arms and legs  . Anemia     hx  . H/O hiatal hernia   . Neuromuscular disorder ([place])     ?  Marland Kitchen Dysrhythmia   . Mental disorder     hx of confusion  . CHF (congestive heart failure) ([place])   . Anginal pain ([place])   . Pneumonia 2000's    "once"  . Chronic  bronchitis ([place])     "off and on all the time" (07/18/2013)  . Claustrophobia   . Type II diabetes mellitus ([place])   . Chronic lower back pain   . Other and unspecified angina pectoris   . Obstructive sleep apnea     "can't wear machine; I have claustrophobia" (07/18/2013)  . Osteoarthritis     "knees and hands" (07/18/2013)  . Bundle branch block, left     chronic/notes 07/18/2013  . Vomiting blood   . Myocardial infarction ([place])     04/1999, 02/2000, 01/2005; 2011; 2014  . Shortness of breath     with exertion  . Chronic kidney disease     frequency, sses dr Jamal Maes every 4 to 6 months  . Migraine     "used to have them really bad; kind of eased up now; down to 1 q couple months" (07/18/2013)  . Headache     "probably 4 days/wk" (07/18/2013)  . Leg cramps     both legs at times  . Stroke ([place]) 03/22/12    right side brain; denies residual on 07/18/2013)  . Stroke [facility]) Oct. 2015    Affected pt.s balance  . Cancer [facility]) April 2016    BCC,  Melanoma  . Common migraine 05/14/2014  . Abnormality of gait 05/14/2014  . Memory change 05/14/2014  Past Surgical History  Procedure Laterality Date  . Tumor removal  1970's    ovarian  . Tibia fracture surgery Right 1970's    rods and pins  . Gastric bypass  1984    "stapeling"  . Total abdominal hysterectomy  1970's    w/ appendectomy  . Ankle fracture surgery Left 1970's  . Fracture surgery    . Esophagogastroduodenoscopy  08/03/2011    Procedure: ESOPHAGOGASTRODUODENOSCOPY (EGD);  Surgeon: Shann Medal, MD;  Location: [facility];  Service: General;  Laterality: N/A;  . Gall stone removal  05/2003  . Root canal  10/2000  . Appendectomy  1970's    w/hysterectomy  . Coronary angioplasty with stent placement  01,02,05,06,07,08,11; 04/24/2013    "I've probably got ~ 10 stents by now" (04/24/2013)  . Coronary angioplasty with stent placement  06/13/2013    "got 4 stents today" (06/13/2013)  . Cardiac catheterization  10/10/2012     Dr [name].  . Carotid endarterectomy Left 03/2013  . Cerebral angiogram N/A 04/05/2011    Procedure: CEREBRAL ANGIOGRAM;  Surgeon: Angelia Mould, MD;  Location: [facility] C[facility] LAB;  Service: Cardiovascular;  Laterality: N/A;  . Carotid stent insertion Left 03/20/2013    Procedure: CAROTID STENT INSERTION;  Surgeon: [name], MD;  Location: [facility] CATH LAB;  Service: Cardiovascular;  Laterality: Left;  internal carotid  . Percutaneous stent intervention N/A 04/24/2013    Procedure: PERCUTANEOUS STENT INTERVENTION;  Surgeon: [name], MD;  Location: [facility] CATH LAB;  Service: Cardiovascular;  Laterality: N/A;  . Percutaneous coronary stent intervention (pci-s) N/A 06/13/2013    Procedure: PERCUTANEOUS CORONARY STENT INTERVENTION (PCI-S);  Surgeon: [name], MD;  Location: [facility] CATH LAB;  Service: Cardiovascular;  Laterality: N/A;  . Coronary artery bypass graft  1220/11    "CABG X5"  . Cholecystectomy  2004  . Hernia repair  2004    "in my stomach; had OR on it twice", wire mesh on 1 hernia  . Esophagogastroduodenoscopy (egd) with propofol N/A 03/11/2014    Procedure: ESOPHAGOGASTRODUODENOSCOPY (EGD) WITH PROPOFOL;  Surgeon: Lafayette Dragon, MD;  Location: [facility] ENDOSCOPY;  Service: Endoscopy;  Laterality: N/A;  . Tooth extraction      Family History  Problem Relation Age of Onset  . Heart disease Father     Heart Disease before age 23  . Diabetes Father   . Hyperlipidemia Father   . Hypertension Father   . Heart attack Father   . Deep vein thrombosis Father   . Colon cancer Neg Hx   . Hypertension Mother   . Deep vein thrombosis Mother   . Cancer Sister     Ovarian  . Hypertension Sister   . Heart disease Paternal Uncle   . Diabetes Paternal Uncle   . Heart attack Paternal Grandfather 3    died of MI at 16  . Diabetes Paternal Grandmother   . Diabetes Paternal Aunt   . Diabetes Paternal Uncle     Social history:  reports that she has never smoked. She has never used  smokeless tobacco. She reports that she does not drink alcohol or use illicit drugs.    Allergies  Allergen Reactions  . Amoxicillin Shortness Of Breath and Rash  . Brilinta [Ticagrelor] Shortness Of Breath  . Erythromycin Shortness Of Breath, Other (See Comments) and Hives    Trouble swallowing  . Metronidazole Palpitations and Shortness Of Breath  . Penicillins Shortness Of Breath, Rash and Hives  . Isosorbide Mononitrate [  Isosorbide]     Other reaction(s): Other (See Comments) Joint aches, muscles hurt, difficult to walk Joint aches, muscles hurt, difficult to walk  . Metformin And Related     Stomach pain, cold sweats, joint pain, burred vision, dizziness  . Tape Other (See Comments)    Skin pulls off with certain types Plastic tape causes skin to rip if left on for long periods of time    Medications:  Prior to Admission medications   Medication Sig Start Date End Date Taking? Authorizing Provider  acetaminophen (TYLENOL) 325 MG tablet Take 650 mg by mouth every 6 (six) hours as needed.   Yes Historical Provider, MD  albuterol (PROVENTIL) (2.5 MG/3ML) 0.083% nebulizer solution Take 2.5 mg by nebulization every 6 (six) hours as needed for shortness of breath (when asthma is active).    Yes Historical Provider, MD  albuterol (PROVENTIL,VENTOLIN) 90 MCG/ACT inhaler Inhale 2 puffs into the lungs every 6 (six) hours as needed for shortness of breath.    Yes Historical Provider, MD  Alirocumab (PRALUENT) 75 MG/ML SOSY Inject 75 mg into the skin every 14 (fourteen) days. 02/12/14  Yes Larey Dresser, MD  ALPRAZolam Duanne Moron) 0.5 MG tablet Take 0.5 mg by mouth 3 (three) times daily as needed for anxiety. For anxiety - Patient takes 1 in the morning, then 1 in the evening, 2 at bedtime   Yes Historical Provider, MD  amLODipine (NORVASC) 10 MG tablet Take 1 tablet (10 mg total) by mouth daily. Patient taking differently: Take 10 mg by mouth daily with breakfast.  10/04/12  Yes Larey Dresser,  MD  aspirin EC 81 MG tablet Take 1 tablet (81 mg total) by mouth daily. 10/23/14  Yes Larey Dresser, MD  b complex vitamins capsule Take 1 capsule by mouth daily.   Yes Historical Provider, MD  Calcium Carbonate 1500 (600 CA) MG TABS Take 1,500 mg by mouth daily with breakfast.   Yes Historical Provider, MD  Cholecalciferol (VITAMIN D) 2000 UNITS tablet Take 2,000 Units by mouth daily with breakfast.    Yes Historical Provider, MD  cholestyramine (QUESTRAN) 4 GM/DOSE powder Take 2 g by mouth 2 (two) times daily with a meal. (mixed in juice or light kool-aid). 03/13/13  Yes Larey Dresser, MD  cloNIDine (CATAPRES) 0.2 MG tablet Take 0.2 mg by mouth 2 (two) times daily.   Yes Historical Provider, MD  clopidogrel (PLAVIX) 75 MG tablet TAKE ONE (1) TABLET BY MOUTH EVERY DAY 09/24/14  Yes Larey Dresser, MD  DULoxetine (CYMBALTA) 60 MG capsule Take 60 mg by mouth daily with breakfast.   Yes Historical Provider, MD  ezetimibe (ZETIA) 10 MG tablet Take 1 tablet (10 mg total) by mouth daily. Patient taking differently: Take 10 mg by mouth at bedtime.  08/02/13  Yes Larey Dresser, MD  Fluticasone-Salmeterol (ADVAIR DISKUS) 250-50 MCG/DOSE AEPB Inhale 1 puff into the lungs every 12 (twelve) hours.    Yes Historical Provider, MD  furosemide (LASIX) 40 MG tablet Take 1 tablet (40 mg total) by mouth 2 (two) times daily. 09/23/14  Yes Larey Dresser, MD  gabapentin (NEURONTIN) 600 MG tablet Take 300-600 mg by mouth 3 (three) times daily. 1 tablet AM, 1 tablet at noon and 1 1/2 tablet at bedtime.   Yes Historical Provider, MD  HUMALOG KWIKPEN 100 UNIT/ML SOPN Inject 10-14 Units into the skin 4 (four) times daily -  with meals and at bedtime. 10 units at every meal and 14 units  at bedtime if needed 09/19/12  Yes Historical Provider, MD  hydrALAZINE (APRESOLINE) 50 MG tablet Take 50 mg by mouth 3 (three) times daily as needed (for high blood pressure). If BP is >175   Yes Historical Provider, MD    HYDROcodone-acetaminophen (NORCO/VICODIN) 5-325 MG per tablet Take 1 tablet by mouth at bedtime as needed.    Yes Historical Provider, MD  ipratropium-albuterol (DUONEB) 0.5-2.5 (3) MG/3ML SOLN Take 3 mLs by nebulization as needed (If severe asthma , use every 4 hours for 10 days).    Yes Historical Provider, MD  LANTUS SOLOSTAR 100 UNIT/ML Solostar Pen 44 Units daily. Increase/decrease 4 units sugar changes. 08/23/14  Yes Historical Provider, MD  loperamide (IMODIUM) 2 MG capsule Take 2 mg by mouth as needed for diarrhea or loose stools.   Yes Historical Provider, MD  losartan (COZAAR) 100 MG tablet Take 100 mg by mouth daily with breakfast.    Yes Historical Provider, MD  Magnesium Oxide 250 MG TABS Take 250 mg by mouth 2 (two) times daily.    Yes Historical Provider, MD  meclizine (ANTIVERT) 12.5 MG tablet Take 12.5 mg by mouth 3 (three) times daily as needed for dizziness.   Yes Historical Provider, MD  metoprolol succinate (TOPROL XL) 25 MG 24 hr tablet Take 1 tablet (25 mg total) by mouth 2 (two) times daily. 10/23/14  Yes Larey Dresser, MD  Multiple Vitamin (MULTIVITAMIN) tablet Take 1 tablet by mouth at bedtime.    Yes Historical Provider, MD  nitroGLYCERIN (NITROSTAT) 0.3 MG SL tablet Place 1 tablet (0.3 mg total) under the tongue every 5 (five) minutes x 3 doses as needed for chest pain. 02/28/14  Yes Larey Dresser, MD  ondansetron (ZOFRAN) 4 MG tablet Take 1 tablet (4 mg total) by mouth 2 (two) times daily as needed. For nausea 02/12/14  Yes Lafayette Dragon, MD  pantoprazole (PROTONIX) 40 MG tablet Take 1 tablet (40 mg total) by mouth 2 (two) times daily. 11/08/13  Yes Larey Dresser, MD  Probiotic Product (PROBIOTIC DAILY PO) Take 1 tablet by mouth 2 (two) times daily.   Yes Historical Provider, MD  ranolazine (RANEXA) 1000 MG SR tablet TAKE ONE (1) TABLET BY MOUTH TWO (2) TIMES DAILY 09/23/14  Yes Larey Dresser, MD  rosuvastatin (CRESTOR) 40 MG tablet Take 1 tablet (40 mg total) by mouth  daily. Patient taking differently: Take 40 mg by mouth at bedtime.  02/28/14  Yes Larey Dresser, MD  triamterene-hydrochlorothiazide (DYAZIDE) 37.5-25 MG per capsule Take 1 capsule by mouth at bedtime.  11/08/13  Yes Larey Dresser, MD  zinc gluconate 50 MG tablet Take 50 mg by mouth at bedtime.    Yes Historical Provider, MD  zolpidem (AMBIEN) 10 MG tablet Take 10 mg by mouth at bedtime as needed for sleep.    Yes Historical Provider, MD    ROS:  Out of a complete 14 system review of symptoms, the patient complains only of the following symptoms, and all other reviewed systems are negative.  Fatigue, weight gain, excessive sweating Ringing in the ears Eye redness, blurred vision Shortness of breath, chest tightness Chest pain, leg swelling, palpitations of the heart, heart murmur Flushing Swollen abdomen, abdominal pain, diarrhea, nausea Restless legs, insomnia, frequent waking Frequency of urination, decreased urine Bruising easily  Blood pressure 160/75, pulse 57, height 5\' 2"  (1.575 m), weight 286 lb (129.729 kg).   Blood pressure, right arm, standing is 172/88. Blood pressure, right arm, sitting  is 156/86.  Physical Exam  General: The patient is alert and cooperative at the time of the examination. The patient is morbidly obese.  Skin: 2+ edema below the knees is noted bilaterally.   Neurologic Exam  Mental status: The patient is alert and oriented x 3 at the time of the examination. The patient has apparent normal recent and remote memory, with an apparently normal attention span and concentration ability.   Cranial nerves: Facial symmetry is present. Speech is normal, no aphasia or dysarthria is noted. Extraocular movements are full. Visual fields are full.  Motor: The patient has good strength in all 4 extremities.  Sensory examination: Soft touch sensation is symmetric on the face, arms, and legs.  Coordination: The patient has good finger-nose-finger and  heel-to-shin bilaterally.  Gait and station: The patient has a limping type gait. Tandem gait is unsteady. Romberg is negative. No drift is seen.  Reflexes: Deep tendon reflexes are symmetric, but are depressed.   Assessment/Plan:  1. Diabetes  2. Morbid obesity  3. Diabetic peripheral neuropathy  4. Gait instability  5. Cervical spondylosis  6. Cervicogenic headache  7. Episodic dizziness  8. Polypharmacy  The patient is on a multitude of medications, the patient is having some episodes of brief dizziness that may be related to polypharmacy. The patient is also noting some episodes of dizziness with standing with visual dimming that are likely related to drops in blood pressure. The patient is not orthostatic today. The patient is reporting some neck and shoulder discomfort associated with the cervical spondylosis, and cervicogenic headache. She will be set up for physical therapy for neuromuscular therapy on the neck and shoulders, but also to develop an exercise regimen at home to help with weight loss. The patient will follow-up in 6 months, sooner if needed. The patient will go to 81 mg aspirin, taking 2 daily.  Jill Alexanders MD 11/13/2014 1:58 PM  [facility] [address] [place], [place] [address]  Phone [phone] Fax [phone]

## 2014-11-13 NOTE — Addendum Note (Signed)
Addended by: Domenica Reamer R on: 11/13/2014 11:10 AM   Modules accepted: Orders

## 2014-11-13 NOTE — Addendum Note (Signed)
Addended by: Domenica Reamer R on: 11/13/2014 10:26 AM   Modules accepted: Orders

## 2014-11-13 NOTE — Patient Instructions (Addendum)
We will set you up for physical therapy on the neck and shoulders.   Fall Prevention in the Home  Falls can cause injuries and can affect people from all age groups. There are many simple things that you can do to make your home safe and to help prevent falls. WHAT CAN I DO ON THE OUTSIDE OF MY HOME?  Regularly repair the edges of walkways and driveways and fix any cracks.  Remove high doorway thresholds.  Trim any shrubbery on the main path into your home.  Use bright outdoor lighting.  Clear walkways of debris and clutter, including tools and rocks.  Regularly check that handrails are securely fastened and in good repair. Both sides of any steps should have handrails.  Install guardrails along the edges of any raised decks or porches.  Have leaves, snow, and ice cleared regularly.  Use sand or salt on walkways during winter months.  In the garage, clean up any spills right away, including grease or oil spills. WHAT CAN I DO IN THE BATHROOM?  Use night lights.  Install grab bars by the toilet and in the tub and shower. Do not use towel bars as grab bars.  Use non-skid mats or decals on the floor of the tub or shower.  If you need to sit down while you are in the shower, use a plastic, non-slip stool.Marland Kitchen  Keep the floor dry. Immediately clean up any water that spills on the floor.  Remove soap buildup in the tub or shower on a regular basis.  Attach bath mats securely with double-sided non-slip rug tape.  Remove throw rugs and other tripping hazards from the floor. WHAT CAN I DO IN THE BEDROOM?  Use night lights.  Make sure that a bedside light is easy to reach.  Do not use oversized bedding that drapes onto the floor.  Have a firm chair that has side arms to use for getting dressed.  Remove throw rugs and other tripping hazards from the floor. WHAT CAN I DO IN THE KITCHEN?   Clean up any spills right away.  Avoid walking on wet floors.  Place frequently  used items in easy-to-reach places.  If you need to reach for something above you, use a sturdy step stool that has a grab bar.  Keep electrical cables out of the way.  Do not use floor polish or wax that makes floors slippery. If you have to use wax, make sure that it is non-skid floor wax.  Remove throw rugs and other tripping hazards from the floor. WHAT CAN I DO IN THE STAIRWAYS?  Do not leave any items on the stairs.  Make sure that there are handrails on both sides of the stairs. Fix handrails that are broken or loose. Make sure that handrails are as long as the stairways.  Check any carpeting to make sure that it is firmly attached to the stairs. Fix any carpet that is loose or worn.  Avoid having throw rugs at the top or bottom of stairways, or secure the rugs with carpet tape to prevent them from moving.  Make sure that you have a light switch at the top of the stairs and the bottom of the stairs. If you do not have them, have them installed. WHAT ARE SOME OTHER FALL PREVENTION TIPS?  Wear closed-toe shoes that fit well and support your feet. Wear shoes that have rubber soles or low heels.  When you use a stepladder, make sure that it is  completely opened and that the sides are firmly locked. Have someone hold the ladder while you are using it. Do not climb a closed stepladder.  Add color or contrast paint or tape to grab bars and handrails in your home. Place contrasting color strips on the first and last steps.  Use mobility aids as needed, such as canes, walkers, scooters, and crutches.  Turn on lights if it is dark. Replace any light bulbs that burn out.  Set up furniture so that there are clear paths. Keep the furniture in the same spot.  Fix any uneven floor surfaces.  Choose a carpet design that does not hide the edge of steps of a stairway.  Be aware of any and all pets.  Review your medicines with your healthcare provider. Some medicines can cause dizziness  or changes in blood pressure, which increase your risk of falling. Talk with your health care provider about other ways that you can decrease your risk of falls. This may include working with a physical therapist or trainer to improve your strength, balance, and endurance.   This information is not intended to replace advice given to you by your health care provider. Make sure you discuss any questions you have with your health care provider.   Document Released: 01/08/2002 Document Revised: 06/04/2014 Document Reviewed: 02/22/2014 Elsevier Interactive Patient Education [employer].

## 2014-11-14 LAB — LIPID PANEL
Chol/HDL Ratio: 2.9 ratio units (ref 0.0–4.4)
Cholesterol, Total: 164 mg/dL (ref 100–199)
HDL: 56 mg/dL (ref >39)
LDL Calculated: 75 mg/dL (ref 0–99)
Triglycerides: 163 mg/dL — ABNORMAL HIGH (ref 0–149)
VLDL Cholesterol Cal: 33 mg/dL (ref 5–40)

## 2014-11-14 LAB — HEPATIC FUNCTION PANEL
ALT: 28 IU/L (ref 0–32)
AST: 15 IU/L (ref 0–40)
Albumin: 4.3 g/dL (ref 3.6–4.8)
Alkaline Phosphatase: 74 IU/L (ref 39–117)
Bilirubin Total: 0.5 mg/dL (ref 0.0–1.2)
Bilirubin, Direct: 0.17 mg/dL (ref 0.00–0.40)
Total Protein: 6.5 g/dL (ref 6.0–8.5)

## 2014-11-15 ENCOUNTER — Telehealth: Payer: Self-pay | Admitting: Cardiology

## 2014-11-15 NOTE — Telephone Encounter (Signed)
New Message  Pt returned the call from 11/14/2014 Please call to discus

## 2014-11-15 NOTE — Telephone Encounter (Signed)
Patient give lab results per Dr. [name], good lipids. Patient verbalized understanding.  Notes Recorded by Larey Dresser, MD on 11/14/2014 at 10:51 AM Good lipids

## 2014-11-25 ENCOUNTER — Telehealth: Payer: Self-pay | Admitting: Cardiology

## 2014-11-25 ENCOUNTER — Encounter: Payer: Medicare Other | Attending: Cardiology | Admitting: Dietician

## 2014-11-25 ENCOUNTER — Encounter: Payer: Self-pay | Admitting: Dietician

## 2014-11-25 DIAGNOSIS — Z6841 Body Mass Index (BMI) 40.0 and over, adult: Secondary | ICD-10-CM | POA: Diagnosis not present

## 2014-11-25 DIAGNOSIS — Z794 Long term (current) use of insulin: Secondary | ICD-10-CM | POA: Diagnosis not present

## 2014-11-25 DIAGNOSIS — Z713 Dietary counseling and surveillance: Secondary | ICD-10-CM | POA: Insufficient documentation

## 2014-11-25 DIAGNOSIS — E118 Type 2 diabetes mellitus with unspecified complications: Secondary | ICD-10-CM | POA: Insufficient documentation

## 2014-11-25 NOTE — Patient Instructions (Signed)
Consider counseling (depression, stress, and better self esteem) Buy Glucose tabs (take 3-4 when blood sugar is 70 or less) If symptoms of low blood sugar but above 70 then just suck on 1 hard candy. Avoid caffeine in the afternoon.   Try to have something for breakfast even if it is small. Have small amount of carbohydrate with each meal (30 grams) Consider small snack mid afternoon.  (1 ounce protein and 1 carb choice) Try Mrs. Dash chili or other seasoning mix. Continue to be very mindful when you are eating.  When eating dinner stop when you are satisfied. Remember to drink water. Get into the habit of exercise.  Even 10 minutes daily.  (Arm bike, walking, Rehab exercises)

## 2014-11-25 NOTE — Telephone Encounter (Signed)
New Message  Pt stated she is returning [place] phone call. Please call back and discuss.

## 2014-11-25 NOTE — Telephone Encounter (Signed)
Spoke with patient, appt scheduled with Dr [name].

## 2014-11-25 NOTE — Progress Notes (Signed)
Diabetes Self-Management Education  Visit Type: First/Initial  Appt. Start Time: 0930 Appt. End Time: 1100  11/25/2014  [name] [name], identified by name and date of birth, is a 69 y.o. female with a diagnosis of Diabetes: Type 2 diagnosed in 2001.  Other hx includes stage 3 CKD (potassium 5.1 and GFR 43.8  10/23/14- she is followed by Dr. Lorrene Reid), CVA 2013 and 2015, MI 2001, CHF, and multiple stents.  Hx also includes Gastric bypass surgery "that did not work".  She reports severe vomiting at times and has slow emptying.  Hernia surgery in past but feels that it has returned.  No appetite and eats no breakfast.  From her hx, intake is not excessive.  She follows a low sodium diet.  Patient is here alone.  She is retired and lives with her husband.  Weight has always been a struggle.  Lowest of 200-225 until 2001, 250 lbs UBW until 6-8 months ago then became ill, was on IV antibiotics and very poor energy for 4 moths and was unable to exercise and is at 283 lbs today.  She is frustrated by her continued weight gain, has a poor body image, and depression due to health and other underlying causes.  We discussed this.  She is a member of a sports center 3 miles from her home.    ASSESSMENT  Height 5' 2.5" (1.588 m), weight 283 lb (128.368 kg). Body mass index is 50.9 kg/(m^2).      Diabetes Self-Management Education - 11/25/14 0946    Visit Information   Visit Type First/Initial   Initial Visit   Diabetes Type Type 2   Are you currently following a meal plan? Yes   What type of meal plan do you follow? diabetec   Are you taking your medications as prescribed? Yes   Date Diagnosed 2001   Health Coping   How would you rate your overall health? Poor   Psychosocial Assessment   Patient Belief/Attitude about Diabetes Defeat/Burnout  other days motivated to manage it   Self-care barriers Other (comment)  depression   Self-management support Doctor's office;Family   Other persons present  Patient   Patient Concerns Weight Control;Nutrition/Meal planning   Preferred Learning Style No preference indicated   Learning Readiness Ready   How often do you need to have someone help you when you read instructions, pamphlets, or other written materials from your doctor or pharmacy? 1 - Never   What is the last grade level you completed in school? 2 years college, [employer], real estate   Complications   Last HgB A1C per patient/outside source 7.1 %  09/2014   How often do you check your blood sugar? 3-4 times/day   Fasting Blood glucose range (mg/dL) 180-200   Number of hypoglycemic episodes per month 7   Can you tell when your blood sugar is low? Yes   What do you do if your blood sugar is low? 4 oz OJ then rechecks and repeats if needed   Number of hyperglycemic episodes per week 14   Can you tell when your blood sugar is high? --  sweats it if is very high   What do you do if your blood sugar is high? takes extra humalog   Have you had a dilated eye exam in the past 12 months? Yes   Have you had a dental exam in the past 12 months? Yes   Are you checking your feet? Yes   How many days  per week are you checking your feet? 7   Dietary Intake   Breakfast none   Snack (morning) none   Lunch lite yogurt and fruit or toast with peanut butter OR cereal and lactade milk OR occasional egg and toast and [place] sausage  11-1   Snack (afternoon) none   Dinner salad or slaw (water, vinegar or sweet and low), tuna or chicken or occasional small steak, steamed veges, baked potato or corn or baked sweet potato  6-7   Snack (evening) none   Beverage(s) coffee with sweet and low, flavored water, 1 glass diet soda at night   Exercise   Exercise Type ADL's   How many days per week to you exercise? 0   How many minutes per day do you exercise? 0   Total minutes per week of exercise 0   Patient Education   Previous Diabetes Education Yes (please comment)  class in 2001 and once  after and has worked with Parke Simmers, [place] at cardiac rehab x 4   Disease state  Definition of diabetes, type 1 and 2, and the diagnosis of diabetes   Nutrition management  Role of diet in the treatment of diabetes and the relationship between the three main macronutrients and blood glucose level;Food label reading, portion sizes and measuring food.;Meal options for control of blood glucose level and chronic complications.   Physical activity and exercise  Role of exercise on diabetes management, blood pressure control and cardiac health.;Identified with patient nutritional and/or medication changes necessary with exercise.   Monitoring Identified appropriate SMBG and/or A1C goals.;Interpreting lab values - A1C, lipid, urine microalbumina.   Chronic complications Relationship between chronic complications and blood glucose control;Retinopathy and reason for yearly dilated eye exams   Psychosocial adjustment Worked with patient to identify barriers to care and solutions;Role of stress on diabetes;Identified and addressed patients feelings and concerns about diabetes   Personal strategies to promote health Lifestyle issues that need to be addressed for better diabetes care   Individualized Goals (developed by patient)   Nutrition General guidelines for healthy choices and portions discussed   Physical Activity 15 minutes per day;Exercise 5-7 days per week   Monitoring  test my blood glucose as discussed   Reducing Risk examine blood glucose patterns;treat hypoglycemia with 15 grams of carbs if blood glucose less than 70mg /dL   Outcomes   Expected Outcomes Demonstrated interest in learning. Expect positive outcomes   Future DMSE 4-6 wks   Program Status Completed      Individualized Plan for Diabetes Self-Management Training:   Learning Objective:  Patient will have a greater understanding of diabetes self-management. Patient education plan is to attend individual and/or group sessions per assessed needs  and concerns.   Plan:   Patient Instructions  Consider counseling (depression, stress, and better self esteem) Buy Glucose tabs (take 3-4 when blood sugar is 70 or less) If symptoms of low blood sugar but above 70 then just suck on 1 hard candy. Avoid caffeine in the afternoon.   Try to have something for breakfast even if it is small. Have small amount of carbohydrate with each meal (30 grams) Consider small snack mid afternoon.  (1 ounce protein and 1 carb choice) Try Mrs. Dash chili or other seasoning mix. Continue to be very mindful when you are eating.  When eating dinner stop when you are satisfied. Remember to drink water. Get into the habit of exercise.  Even 10 minutes daily.  (Arm bike, walking, Rehab exercises)  Expected Outcomes:  Demonstrated interest in learning. Expect positive outcomes  Education material provided: Food label handouts, A1C conversion sheet, Meal plan card, My Plate and Snack sheet  If problems or questions, patient to contact team via:  Phone and Email  Future DSME appointment: 4-6 wks

## 2014-12-11 ENCOUNTER — Ambulatory Visit: Payer: Medicare Other | Attending: Neurology | Admitting: Rehabilitative and Restorative Service Providers"

## 2014-12-11 DIAGNOSIS — R208 Other disturbances of skin sensation: Secondary | ICD-10-CM | POA: Diagnosis present

## 2014-12-11 DIAGNOSIS — M6281 Muscle weakness (generalized): Secondary | ICD-10-CM | POA: Insufficient documentation

## 2014-12-11 DIAGNOSIS — R293 Abnormal posture: Secondary | ICD-10-CM | POA: Insufficient documentation

## 2014-12-11 DIAGNOSIS — R200 Anesthesia of skin: Secondary | ICD-10-CM

## 2014-12-11 DIAGNOSIS — M542 Cervicalgia: Secondary | ICD-10-CM | POA: Diagnosis not present

## 2014-12-11 NOTE — Patient Instructions (Signed)
Shoulder Circle Shrug    Bring shoulders up and rotate around backward.  Begin with up/down and add rotation only when able to tolerate. Repeat __5-10__ times. Do __2__ sessions per day.  http://gt2.exer.us/15   Copyright  VHI. All rights reserved.  Thoracic Self-Mobilization (Supine)    No towel and arms can begin closer to your side and then progress to out to the side.  Rest for 2 minutes. Do _2___ sessions per day.  http://orth.exer.us/1001   Copyright  VHI. All rights reserved.  AROM: Neck Rotation    Turn head slowly to look over one shoulder, then the other. Hold each position _2-3___ seconds. Repeat __5__ times per set. Do ___2_ sessions per day.  http://orth.exer.us/295   Copyright  VHI. All rights reserved.

## 2014-12-12 NOTE — Therapy (Signed)
[facility] [address] [place], [place], [address] Phone: [phone]   Fax:  [phone]  Physical Therapy Evaluation  Patient Details  Name: [name] [name] [name]: [id number] Date of Birth: [birth year] Referring Provider: Lenor Coffin, MD  Encounter Date: 12/11/2014      PT End of Session - 12/12/14 1253    Visit Number 1   Number of Visits 12   Date for PT Re-Evaluation 01/31/15   Authorization Type G code every 10th visit   PT Start Time 1150   PT Stop Time 1235   PT Time Calculation (min) 45 min   Activity Tolerance Patient tolerated treatment well   Behavior During Therapy [facility] for tasks assessed/performed      Past Medical History  Diagnosis Date  . Vomiting     persistent  . Asthma   . Ventral hernia     hx of  . GERD (gastroesophageal reflux disease)   . Obesity 01-2010  . Hyperlipidemia   . Hypertension   . Irregular heart beat   . Coronary artery disease   . Heart murmur   . Anxiety   . PONV (postoperative nausea and vomiting)   . Peripheral vascular disease (HCC)     ? numbness, tingling arms and legs  . Anemia     hx  . H/O hiatal hernia   . Neuromuscular disorder ([place])     ?  Marland Kitchen Dysrhythmia   . Mental disorder     hx of confusion  . CHF (congestive heart failure) ([place])   . Anginal pain ([place])   . Pneumonia 2000's    "once"  . Chronic bronchitis ([place])     "off and on all the time" (07/18/2013)  . Claustrophobia   . Type II diabetes mellitus ([place])   . Chronic lower back pain   . Other and unspecified angina pectoris   . Obstructive sleep apnea     "can't wear machine; I have claustrophobia" (07/18/2013)  . Osteoarthritis     "knees and hands" (07/18/2013)  . Bundle branch block, left     chronic/notes 07/18/2013  . Vomiting blood   . Myocardial infarction ([place])     04/1999, 02/2000, 01/2005; 2011; 2014  . Shortness of breath     with exertion  . Chronic kidney disease    frequency, sses dr Jamal Maes every 4 to 6 months  . Migraine     "used to have them really bad; kind of eased up now; down to 1 q couple months" (07/18/2013)  . Headache     "probably 4 days/wk" (07/18/2013)  . Leg cramps     both legs at times  . Stroke ([place]) 03/22/12    right side brain; denies residual on 07/18/2013)  . Stroke [facility]) Oct. 2015    Affected pt.s balance  . Cancer [facility]) April 2016    BCC,  Melanoma  . Common migraine 05/14/2014  . Abnormality of gait 05/14/2014  . Memory change 05/14/2014    Past Surgical History  Procedure Laterality Date  . Tumor removal  1970's    ovarian  . Tibia fracture surgery Right 1970's    rods and pins  . Gastric bypass  1984    "stapeling"  . Total abdominal hysterectomy  1970's    w/ appendectomy  . Ankle fracture surgery Left 1970's  . Fracture surgery    . Esophagogastroduodenoscopy  08/03/2011    Procedure: ESOPHAGOGASTRODUODENOSCOPY (EGD);  Surgeon: Fenton Malling  [name], MD;  Location: [facility];  Service: General;  Laterality: N/A;  . Gall stone removal  05/2003  . Root canal  10/2000  . Appendectomy  1970's    w/hysterectomy  . Coronary angioplasty with stent placement  01,02,05,06,07,08,11; 04/24/2013    "I've probably got ~ 10 stents by now" (04/24/2013)  . Coronary angioplasty with stent placement  06/13/2013    "got 4 stents today" (06/13/2013)  . Cardiac catheterization  10/10/2012    Dr [name].  . Carotid endarterectomy Left 03/2013  . Cerebral angiogram N/A 04/05/2011    Procedure: CEREBRAL ANGIOGRAM;  Surgeon: Angelia Mould, MD;  Location: [facility] CATH LAB;  Service: Cardiovascular;  Laterality: N/A;  . Carotid stent insertion Left 03/20/2013    Procedure: CAROTID STENT INSERTION;  Surgeon: [name], MD;  Location: [facility] CATH LAB;  Service: Cardiovascular;  Laterality: Left;  internal carotid  . Percutaneous stent intervention N/A 04/24/2013    Procedure: PERCUTANEOUS STENT INTERVENTION;  Surgeon: [name], MD;   Location: [facility] CATH LAB;  Service: Cardiovascular;  Laterality: N/A;  . Percutaneous coronary stent intervention (pci-s) N/A 06/13/2013    Procedure: PERCUTANEOUS CORONARY STENT INTERVENTION (PCI-S);  Surgeon: [name], MD;  Location: [facility] CATH LAB;  Service: Cardiovascular;  Laterality: N/A;  . Coronary artery bypass graft  1220/11    "CABG X5"  . Cholecystectomy  2004  . Hernia repair  2004    "in my stomach; had OR on it twice", wire mesh on 1 hernia  . Esophagogastroduodenoscopy (egd) with propofol N/A 03/11/2014    Procedure: ESOPHAGOGASTRODUODENOSCOPY (EGD) WITH PROPOFOL;  Surgeon: Lafayette Dragon, MD;  Location: [facility] ENDOSCOPY;  Service: Endoscopy;  Laterality: N/A;  . Tooth extraction      There were no vitals filed for this visit.  Visit Diagnosis:  Neck pain  Arm numbness  Postural instability  Generalized muscle weakness      Subjective Assessment - 12/11/14 1201    Subjective The patient's cc is neck pain and numbness in the upper extremitities when holding onto a grocery cart or while driving (when head in forward position).  In addition the patient has episodic dizziness 2-3 times/week when she can have it all day long reporting spinning and unsteadiness and needing to touch objects while walking, she denies room spinning with bed mobility.  [place] getting up quick provokes lightheaded sensation.  She reports imbalance worse when dizzy and general decline in mobility over time.   Pertinent History Patient reports strokes noted on imaging   Patient Stated Goals Address neck pain and arm numbness   Currently in Pain? Yes   Pain Score 5    Pain Location Neck   Pain Orientation Right;Left   Pain Descriptors / Indicators Dull   Pain Type Chronic pain   Pain Radiating Towards both sholders and can create arm numbness   Pain Onset More than a month ago   Pain Frequency Constant   Aggravating Factors  overhead activities, driving, computer work   Pain Relieving Factors  sitting and being still (no turning) and it will relax itself to regain use of arms/hands.            [facility] PT Assessment - 12/11/14 1205    Assessment   Medical Diagnosis neck pain, numbness in arms   Referring Provider Lenor Coffin, MD   Onset Date/Surgical Date --  2014   Hand Dominance Right   Prior Therapy none   Balance Screen   Has  the patient fallen in the past 6 months No  loses balance and catches herself   Has the patient had a decrease in activity level because of a fear of falling?  No   Is the patient reluctant to leave their home because of a fear of falling?  Yes  stays indoors on days she is more dizzy   [place] residence   Living Arrangements Spouse/significant other   Type of [place] --  scooter ordered 12/2013 for travel (husband purchased)   Additional Comments weakness reported in arms   Prior Function   Level of Independence --  needs help with housekeeping, walks short distances only   Observation/Other Assessments   Other Surveys  --  neck disability index=54%   Posture/Postural Control   Posture/Postural Control Postural limitations   Postural Limitations Rounded Shoulders;Forward head;Increased thoracic kyphosis  shoulders IR at rest,    ROM / Strength   AROM / PROM / Strength AROM;Strength   AROM   Overall AROM  Deficits   AROM Assessment Site Shoulder;Cervical   Right/Left Shoulder Right;Left   Right Shoulder Flexion 120 Degrees   Right Shoulder ABduction 75 Degrees   Left Shoulder Flexion 100 Degrees   Left Shoulder ABduction 75 Degrees   Cervical Flexion 8   Cervical Extension 8   Cervical - Right Side Bend 32   Cervical - Left Side Bend 28   Cervical - Right Rotation 34   Cervical - Left Rotation 22   Strength   Overall Strength Deficits   Overall Strength Comments 10 lbs R side, 5 lbs left grip.  True MMT not performed due to unable to lift against gravity in  shoulders indicating <3/5 strength in shoulders,  Pt with 3+/5 elbow flexion strength      THERAPEUTIC EXERCISE: HEP: see below       PT Education - 12/12/14 1252    Education provided Yes   Education Details HEP: shoulder shrug, neck rotation, supine thoracic/chest stretch   Person(s) Educated Patient   Methods Explanation;Demonstration;Handout   Comprehension Verbalized understanding;Returned demonstration          PT Short Term Goals - 12/12/14 1254    PT SHORT TERM GOAL #1   Title The patient will be independent with HEP for postural flexibility, neck stretching, and postural stabilization.   Baseline Target date 01/10/2015   Time 4   Period Weeks   PT SHORT TERM GOAL #2   Title The patient will improve neck ROM flexion/extension from 8 degrees to > or equal to 18 degrees.   Baseline Target date 01/10/2015   Time 4   Period Weeks   PT SHORT TERM GOAL #3   Title The aptient will improve R grip from 10 lbs to > or equal to 15 lbs. and L grip from 5 lbs to > or equal to 10 lbs.   Baseline Target date 01/10/2015   Time 4   Period Weeks   PT SHORT TERM GOAL #4   Title The patient will report pain < or equal to 3/10 at rest.   Baseline Target date 01/10/2015   Time 4   Period Weeks           PT Long Term Goals - 12/12/14 1301    PT LONG TERM GOAL #1   Title The patient will  improve neck disability index by > or equal to 15% (from 54% baseline).   Baseline Target  date 01/31/2015   Time 6   Period Weeks   PT LONG TERM GOAL #2   Title The patient will imrpove neck rotation R from 34 deg to 45 deg and L from 22 deg to 40 deg.   Baseline Target date 01/31/2015   Time 6   Period Weeks   PT LONG TERM GOAL #3   Title The patient will improve shoulder A/ROM to > or equal to 140 degrees bilaterally in seated position.   Baseline Target date 01/31/2015   Time 6   Period Weeks   PT LONG TERM GOAL #4   Title The patient will be able to reach overhead to fix hair without  reports of arms going numb.   Baseline Target date 01/31/2015   Time 6   Period Weeks               Plan - 12/12/14 1303    Clinical Impression Statement The patient is a 69 yo female presenting with significant tightness in neck musculature as well as anterior chest musculature leading to UE numbness. The patient also has intermittent dizziness and instability during gait, however she reports her primary reason for seeking PT services is neck pain and arm numbness.     Pt will benefit from skilled therapeutic intervention in order to improve on the following deficits Abnormal gait;Decreased activity tolerance;Decreased mobility;Decreased strength;Decreased range of motion;Impaired flexibility;Dizziness;Pain   Rehab Potential Good   PT Frequency 2x / week   PT Duration 6 weeks   PT Treatment/Interventions ADLs/Self Care Home Management;Balance training;Neuromuscular re-education;Therapeutic activities;Functional mobility training;Patient/family education;Therapeutic exercise;Manual techniques;Cryotherapy;Traction;Passive range of motion   PT Next Visit Plan Check HEP, anterior chest stretching, postural strengthening.   Consulted and Agree with Plan of Care Patient          G-Codes - 01-07-15 1316    Functional Assessment Tool Used neck disability index=54%   Functional Limitation Self care   Self Care Current Status [id number]) At least 40 percent but less than 60 percent impaired, limited or restricted   Self Care Goal Status [id number]) At least 20 percent but less than 40 percent impaired, limited or restricted       Problem List Patient Active Problem List   Diagnosis Date Noted  . Common migraine 05/14/2014  . Abnormality of gait 05/14/2014  . Memory change 05/14/2014  . Aneurysm, cerebral, nonruptured 05/14/2014  . Cramp of limb-Left neck 05/10/2014  . Hematemesis with nausea   . Vomiting blood   . Dizziness and giddiness 09/21/2013  . Atypical chest pain 07/18/2013  .  Unstable angina ([place]) 04/09/2013  . Carotid artery stenosis, symptomatic 03/20/2013  . Chronic diastolic CHF (congestive heart failure) ([place]) 09/23/2012  . Cerebrovascular disease 07/10/2012  . Cerebral artery occlusion with cerebral infarction ([place]) 07/10/2012  . Mitral regurgitation 04/15/2012  . TIA (transient ischemic attack) 04/15/2012  . Occlusion and stenosis of carotid artery without mention of cerebral infarction 08/18/2011  . Bariatric surgery status 06/17/2011  . Speech abnormality 03/22/2011  . Dyspnea 02/24/2011  . PAPILLARY MUSCLE DYSFUNCTION, NON-RHEUMATIC 10/09/2008  . UNSPECIFIED VITAMIN D DEFICIENCY 10/24/2007  . MYOCARDIAL INFARCTION, HX OF 10/24/2007  . PERSISTENT VOMITING 10/24/2007  . OSTEOARTHRITIS 10/24/2007  . MIGRAINES, HX OF 10/24/2007  . DM 01/16/2007  . Hyperlipemia 01/16/2007  . Obesity-post failed open gastroplasty 1984  01/16/2007  . OBSTRUCTIVE SLEEP APNEA 01/16/2007  . Essential hypertension 01/16/2007  . Coronary atherosclerosis 01/16/2007  . ASTHMA 01/16/2007  . GERD 01/16/2007  . VENTRAL HERNIA  01/16/2007    Thank you for the referral of this patient. Rudell Cobb, MPT  [place], PT 12/12/2014, 1:18 PM  [facility] [address] [place], [place], [address] Phone: [phone]   Fax:  [phone]  Name: NYSA BROSZ [name]: [id number] Date of Birth: July 12, 1945

## 2014-12-16 ENCOUNTER — Encounter: Payer: Self-pay | Admitting: Physical Therapy

## 2014-12-16 ENCOUNTER — Ambulatory Visit: Payer: Medicare Other | Admitting: Physical Therapy

## 2014-12-16 DIAGNOSIS — M6281 Muscle weakness (generalized): Secondary | ICD-10-CM

## 2014-12-16 DIAGNOSIS — R293 Abnormal posture: Secondary | ICD-10-CM

## 2014-12-16 DIAGNOSIS — M542 Cervicalgia: Secondary | ICD-10-CM | POA: Diagnosis not present

## 2014-12-16 DIAGNOSIS — R200 Anesthesia of skin: Secondary | ICD-10-CM

## 2014-12-16 NOTE — Therapy (Signed)
[facility] [address] [place], [place], [address] Phone: [phone]   Fax:  [phone]  Physical Therapy Treatment  Patient Details  Name: [name] [name] [name]: [id number] Date of Birth: 1945-07-27 Referring Provider: Lenor Coffin, MD  Encounter Date: 12/16/2014      PT End of Session - 12/16/14 1156    Visit Number 2   Number of Visits 12   Date for PT Re-Evaluation 01/31/15   Authorization Type G code every 10th visit   PT Start Time 0847   PT Stop Time 0930   PT Time Calculation (min) 43 min   Equipment Utilized During Treatment Gait belt   Activity Tolerance Patient tolerated treatment well   Behavior During Therapy [facility] for tasks assessed/performed      Past Medical History  Diagnosis Date  . Vomiting     persistent  . Asthma   . Ventral hernia     hx of  . GERD (gastroesophageal reflux disease)   . Obesity 01-2010  . Hyperlipidemia   . Hypertension   . Irregular heart beat   . Coronary artery disease   . Heart murmur   . Anxiety   . PONV (postoperative nausea and vomiting)   . Peripheral vascular disease (HCC)     ? numbness, tingling arms and legs  . Anemia     hx  . H/O hiatal hernia   . Neuromuscular disorder ([place])     ?  Marland Kitchen Dysrhythmia   . Mental disorder     hx of confusion  . CHF (congestive heart failure) ([place])   . Anginal pain ([place])   . Pneumonia 2000's    "once"  . Chronic bronchitis ([place])     "off and on all the time" (07/18/2013)  . Claustrophobia   . Type II diabetes mellitus ([place])   . Chronic lower back pain   . Other and unspecified angina pectoris   . Obstructive sleep apnea     "can't wear machine; I have claustrophobia" (07/18/2013)  . Osteoarthritis     "knees and hands" (07/18/2013)  . Bundle branch block, left     chronic/notes 07/18/2013  . Vomiting blood   . Myocardial infarction ([place])     04/1999, 02/2000, 01/2005; 2011; 2014  . Shortness of breath      with exertion  . Chronic kidney disease     frequency, sses dr Jamal Maes every 4 to 6 months  . Migraine     "used to have them really bad; kind of eased up now; down to 1 q couple months" (07/18/2013)  . Headache     "probably 4 days/wk" (07/18/2013)  . Leg cramps     both legs at times  . Stroke ([place]) 03/22/12    right side brain; denies residual on 07/18/2013)  . Stroke [facility]) Oct. 2015    Affected pt.s balance  . Cancer [facility]) April 2016    BCC,  Melanoma  . Common migraine 05/14/2014  . Abnormality of gait 05/14/2014  . Memory change 05/14/2014    Past Surgical History  Procedure Laterality Date  . Tumor removal  1970's    ovarian  . Tibia fracture surgery Right 1970's    rods and pins  . Gastric bypass  1984    "stapeling"  . Total abdominal hysterectomy  1970's    w/ appendectomy  . Ankle fracture surgery Left 1970's  . Fracture surgery    . Esophagogastroduodenoscopy  08/03/2011  Procedure: ESOPHAGOGASTRODUODENOSCOPY (EGD);  Surgeon: Shann Medal, MD;  Location: [facility];  Service: General;  Laterality: N/A;  . Gall stone removal  05/2003  . Root canal  10/2000  . Appendectomy  1970's    w/hysterectomy  . Coronary angioplasty with stent placement  01,02,05,06,07,08,11; 04/24/2013    "I've probably got ~ 10 stents by now" (04/24/2013)  . Coronary angioplasty with stent placement  06/13/2013    "got 4 stents today" (06/13/2013)  . Cardiac catheterization  10/10/2012    Dr [name].  . Carotid endarterectomy Left 03/2013  . Cerebral angiogram N/A 04/05/2011    Procedure: CEREBRAL ANGIOGRAM;  Surgeon: Angelia Mould, MD;  Location: [facility] CATH LAB;  Service: Cardiovascular;  Laterality: N/A;  . Carotid stent insertion Left 03/20/2013    Procedure: CAROTID STENT INSERTION;  Surgeon: [name], MD;  Location: [facility] CATH LAB;  Service: Cardiovascular;  Laterality: Left;  internal carotid  . Percutaneous stent intervention N/A 04/24/2013    Procedure: PERCUTANEOUS STENT  INTERVENTION;  Surgeon: [name], MD;  Location: [facility] CATH LAB;  Service: Cardiovascular;  Laterality: N/A;  . Percutaneous coronary stent intervention (pci-s) N/A 06/13/2013    Procedure: PERCUTANEOUS CORONARY STENT INTERVENTION (PCI-S);  Surgeon: [name], MD;  Location: [facility] CATH LAB;  Service: Cardiovascular;  Laterality: N/A;  . Coronary artery bypass graft  1220/11    "CABG X5"  . Cholecystectomy  2004  . Hernia repair  2004    "in my stomach; had OR on it twice", wire mesh on 1 hernia  . Esophagogastroduodenoscopy (egd) with propofol N/A 03/11/2014    Procedure: ESOPHAGOGASTRODUODENOSCOPY (EGD) WITH PROPOFOL;  Surgeon: Lafayette Dragon, MD;  Location: [facility] ENDOSCOPY;  Service: Endoscopy;  Laterality: N/A;  . Tooth extraction      There were no vitals filed for this visit.  Visit Diagnosis:  Neck pain  Arm numbness  Postural instability  Generalized muscle weakness      Subjective Assessment - 12/16/14 1148    Subjective Pt. reported posterior neck pain as a 5/10 initially.  Pt. reports frequent dizziness while turning over in bed and while walking.     Pertinent History Patient reports strokes noted on imaging   Limitations House hold activities;Walking   Patient Stated Goals Address neck pain and arm numbness   Currently in Pain? Yes   Pain Score 5    Pain Location Neck   Pain Orientation Posterior   Pain Descriptors / Indicators Constant;Aching   Pain Type Chronic pain   Pain Radiating Towards both shoulders and can create arm numbness into the fingers.     Pain Onset More than a month ago   Pain Frequency Constant   Aggravating Factors  overhead activities, pushing shopping cart.   Pain Relieving Factors sitting and being still.     Effect of Pain on Daily Activities pt. reports a decreased activity level since onset of pain.     Multiple Pain Sites No      Manual Therapy (to decompress cervical vertebrae to decrease pain level): - manual traction in  supine with slight cervical flexion (2 x 2 min); edema and heat noted over C7 spinous process. - manual traction with slight rotation to R and L (2 min each way); edema and heat noted over C7 spinous process.  Stretching (to decrease anterior chest tightness and increase scapular retraction ROM): - standing pectoral major stretch on wall PROM (2 x 30 sec each arm) (to increase bilateral shoulder horizontal  abduction ROM to decreased rounding of shoulders)  - supine hook lying arms outstretched on mat PROM stretch x 2 min; pt. demo'd increased pain level in supine with arms outstretched and verbalized anterior chest tightness.  HEP Review (to increase pt. understanding and adherence to HEP): - shoulder circle shrugs 2 x 10 reps; pt. verbalized increased pain with movement.   - sitting head turns to R and L x 5 each way (3 sec hold); pt. verbalized pain with rotation to the R at 20 degrees.   Therex (to increase scapular retraction strength and improve posture awareness): Pt. educated on purpose of scapular retraction exercise.   - sitting scapular retraction 2 x 10 reps with yellow TB; pt. required thorough verbal description with visual demonstration by SPTA to understand exercise.            [facility] Adult PT Treatment/Exercise - 12/16/14 1153    Transfers   Transfers Sit to Stand;Stand to Sit   Sit to Stand 6: Modified independent (Device/Increase time)   Stand to Sit 6: Modified independent (Device/Increase time)   Transfer Cueing No cueing required   Comments pt. demo'd slight dizziness with sit to stand that subsided within a few seconds.              PT Short Term Goals - 12/12/14 1254    PT SHORT TERM GOAL #1   Title The patient will be independent with HEP for postural flexibility, neck stretching, and postural stabilization.   Baseline Target date 01/10/2015   Time 4   Period Weeks   PT SHORT TERM GOAL #2   Title The patient will improve neck ROM flexion/extension from 8  degrees to > or equal to 18 degrees.   Baseline Target date 01/10/2015   Time 4   Period Weeks   PT SHORT TERM GOAL #3   Title The aptient will improve R grip from 10 lbs to > or equal to 15 lbs. and L grip from 5 lbs to > or equal to 10 lbs.   Baseline Target date 01/10/2015   Time 4   Period Weeks   PT SHORT TERM GOAL #4   Title The patient will report pain < or equal to 3/10 at rest.   Baseline Target date 01/10/2015   Time 4   Period Weeks           PT Long Term Goals - 12/12/14 1301    PT LONG TERM GOAL #1   Title The patient will  improve neck disability index by > or equal to 15% (from 54% baseline).   Baseline Target date 01/31/2015   Time 6   Period Weeks   PT LONG TERM GOAL #2   Title The patient will imrpove neck rotation R from 34 deg to 45 deg and L from 22 deg to 40 deg.   Baseline Target date 01/31/2015   Time 6   Period Weeks   PT LONG TERM GOAL #3   Title The patient will improve shoulder A/ROM to > or equal to 140 degrees bilaterally in seated position.   Baseline Target date 01/31/2015   Time 6   Period Weeks   PT LONG TERM GOAL #4   Title The patient will be able to reach overhead to fix hair without reports of arms going numb.   Baseline Target date 01/31/2015   Time 6   Period Weeks            Plan - 12/16/14  1157    Clinical Impression Statement Pt. tolerated treatment well however experienced increased neck pain with cervical rotational movements.  Pt. Continues to experience tingling in hands with bilateral shoulder flexion to ~ 60 degrees. Pt presenting with increased chest and cervical muscular tightness. Slow progression toward goals.   Pt will benefit from skilled therapeutic intervention in order to improve on the following deficits Abnormal gait;Decreased activity tolerance;Decreased mobility;Decreased strength;Decreased range of motion;Impaired flexibility;Dizziness;Pain   Rehab Potential Good   PT Frequency 2x / week   PT Duration 6  weeks   PT Treatment/Interventions ADLs/Self Care Home Management;Balance training;Neuromuscular re-education;Therapeutic activities;Functional mobility training;Patient/family education;Therapeutic exercise;Manual techniques;Cryotherapy;Traction;Passive range of motion   PT Next Visit Plan Continue checking HEP, anterior chest stretching, postural strengthening.   Consulted and Agree with Plan of Care Patient        Problem List Patient Active Problem List   Diagnosis Date Noted  . Common migraine 05/14/2014  . Abnormality of gait 05/14/2014  . Memory change 05/14/2014  . Aneurysm, cerebral, nonruptured 05/14/2014  . Cramp of limb-Left neck 05/10/2014  . Hematemesis with nausea   . Vomiting blood   . Dizziness and giddiness 09/21/2013  . Atypical chest pain 07/18/2013  . Unstable angina ([place]) 04/09/2013  . Carotid artery stenosis, symptomatic 03/20/2013  . Chronic diastolic CHF (congestive heart failure) ([place]) 09/23/2012  . Cerebrovascular disease 07/10/2012  . Cerebral artery occlusion with cerebral infarction ([place]) 07/10/2012  . Mitral regurgitation 04/15/2012  . TIA (transient ischemic attack) 04/15/2012  . Occlusion and stenosis of carotid artery without mention of cerebral infarction 08/18/2011  . Bariatric surgery status 06/17/2011  . Speech abnormality 03/22/2011  . Dyspnea 02/24/2011  . PAPILLARY MUSCLE DYSFUNCTION, NON-RHEUMATIC 10/09/2008  . UNSPECIFIED VITAMIN D DEFICIENCY 10/24/2007  . MYOCARDIAL INFARCTION, HX OF 10/24/2007  . PERSISTENT VOMITING 10/24/2007  . OSTEOARTHRITIS 10/24/2007  . MIGRAINES, HX OF 10/24/2007  . DM 01/16/2007  . Hyperlipemia 01/16/2007  . Obesity-post failed open gastroplasty 1984  01/16/2007  . OBSTRUCTIVE SLEEP APNEA 01/16/2007  . Essential hypertension 01/16/2007  . Coronary atherosclerosis 01/16/2007  . ASTHMA 01/16/2007  . GERD 01/16/2007  . VENTRAL HERNIA 01/16/2007    Bess Harvest 12/16/2014, 12:04 PM  Bess Harvest,  [place]  Name: DESHAUNA KEEN [name]: [id number] Date of Birth: 12-Jul-1945  This note has been reviewed and edited by supervising CI.  Willow Ora, PTA, [facility] [address] [place],  [address] [phone] 12/16/2014, 12:49 PM

## 2014-12-16 NOTE — Patient Instructions (Signed)
Pt. Instructed on importance of adherence to HEP to stretch anterior chest while strengthening scapular retractors.

## 2014-12-18 ENCOUNTER — Ambulatory Visit: Payer: Medicare Other | Admitting: Physical Therapy

## 2014-12-18 ENCOUNTER — Encounter: Payer: Self-pay | Admitting: Physical Therapy

## 2014-12-18 ENCOUNTER — Telehealth: Payer: Self-pay | Admitting: Cardiology

## 2014-12-18 DIAGNOSIS — M6281 Muscle weakness (generalized): Secondary | ICD-10-CM

## 2014-12-18 DIAGNOSIS — M542 Cervicalgia: Secondary | ICD-10-CM | POA: Diagnosis not present

## 2014-12-18 DIAGNOSIS — R293 Abnormal posture: Secondary | ICD-10-CM

## 2014-12-18 DIAGNOSIS — R200 Anesthesia of skin: Secondary | ICD-10-CM

## 2014-12-18 MED ORDER — ALIROCUMAB 75 MG/ML ~~LOC~~ SOSY: 75.0000 mg | PREFILLED_SYRINGE | SUBCUTANEOUS | Status: DC

## 2014-12-18 NOTE — Therapy (Signed)
[facility] [address] [place], [place], [address] Phone: [phone]   Fax:  [phone]  Physical Therapy Treatment  Patient Details  Name: [name] [name] [name]: [id number] Date of Birth: 1945/07/29 Referring Provider: Lenor Coffin, MD  Encounter Date: 12/18/2014      PT End of Session - 12/18/14 1242    Visit Number 3   Number of Visits 12   Date for PT Re-Evaluation 01/31/15   Authorization Type G code every 10th visit   PT Start Time 1145   PT Stop Time 1228   PT Time Calculation (min) 43 min   Equipment Utilized During Treatment Gait belt   Activity Tolerance Patient tolerated treatment well   Behavior During Therapy [facility] for tasks assessed/performed      Past Medical History  Diagnosis Date  . Vomiting     persistent  . Asthma   . Ventral hernia     hx of  . GERD (gastroesophageal reflux disease)   . Obesity 01-2010  . Hyperlipidemia   . Hypertension   . Irregular heart beat   . Coronary artery disease   . Heart murmur   . Anxiety   . PONV (postoperative nausea and vomiting)   . Peripheral vascular disease (HCC)     ? numbness, tingling arms and legs  . Anemia     hx  . H/O hiatal hernia   . Neuromuscular disorder ([place])     ?  Marland Kitchen Dysrhythmia   . Mental disorder     hx of confusion  . CHF (congestive heart failure) ([place])   . Anginal pain ([place])   . Pneumonia 2000's    "once"  . Chronic bronchitis ([place])     "off and on all the time" (07/18/2013)  . Claustrophobia   . Type II diabetes mellitus ([place])   . Chronic lower back pain   . Other and unspecified angina pectoris   . Obstructive sleep apnea     "can't wear machine; I have claustrophobia" (07/18/2013)  . Osteoarthritis     "knees and hands" (07/18/2013)  . Bundle branch block, left     chronic/notes 07/18/2013  . Vomiting blood   . Myocardial infarction ([place])     04/1999, 02/2000, 01/2005; 2011; 2014  . Shortness of breath      with exertion  . Chronic kidney disease     frequency, sses dr Jamal Maes every 4 to 6 months  . Migraine     "used to have them really bad; kind of eased up now; down to 1 q couple months" (07/18/2013)  . Headache     "probably 4 days/wk" (07/18/2013)  . Leg cramps     both legs at times  . Stroke ([place]) 03/22/12    right side brain; denies residual on 07/18/2013)  . Stroke [facility]) Oct. 2015    Affected pt.s balance  . Cancer [facility]) April 2016    BCC,  Melanoma  . Common migraine 05/14/2014  . Abnormality of gait 05/14/2014  . Memory change 05/14/2014    Past Surgical History  Procedure Laterality Date  . Tumor removal  1970's    ovarian  . Tibia fracture surgery Right 1970's    rods and pins  . Gastric bypass  1984    "stapeling"  . Total abdominal hysterectomy  1970's    w/ appendectomy  . Ankle fracture surgery Left 1970's  . Fracture surgery    . Esophagogastroduodenoscopy  08/03/2011  Procedure: ESOPHAGOGASTRODUODENOSCOPY (EGD);  Surgeon: Shann Medal, MD;  Location: [facility];  Service: General;  Laterality: N/A;  . Gall stone removal  05/2003  . Root canal  10/2000  . Appendectomy  1970's    w/hysterectomy  . Coronary angioplasty with stent placement  01,02,05,06,07,08,11; 04/24/2013    "I've probably got ~ 10 stents by now" (04/24/2013)  . Coronary angioplasty with stent placement  06/13/2013    "got 4 stents today" (06/13/2013)  . Cardiac catheterization  10/10/2012    Dr [name].  . Carotid endarterectomy Left 03/2013  . Cerebral angiogram N/A 04/05/2011    Procedure: CEREBRAL ANGIOGRAM;  Surgeon: Angelia Mould, MD;  Location: [facility] CATH LAB;  Service: Cardiovascular;  Laterality: N/A;  . Carotid stent insertion Left 03/20/2013    Procedure: CAROTID STENT INSERTION;  Surgeon: [name], MD;  Location: [facility] CATH LAB;  Service: Cardiovascular;  Laterality: Left;  internal carotid  . Percutaneous stent intervention N/A 04/24/2013    Procedure: PERCUTANEOUS STENT  INTERVENTION;  Surgeon: [name], MD;  Location: [facility] CATH LAB;  Service: Cardiovascular;  Laterality: N/A;  . Percutaneous coronary stent intervention (pci-s) N/A 06/13/2013    Procedure: PERCUTANEOUS CORONARY STENT INTERVENTION (PCI-S);  Surgeon: [name], MD;  Location: [facility] CATH LAB;  Service: Cardiovascular;  Laterality: N/A;  . Coronary artery bypass graft  1220/11    "CABG X5"  . Cholecystectomy  2004  . Hernia repair  2004    "in my stomach; had OR on it twice", wire mesh on 1 hernia  . Esophagogastroduodenoscopy (egd) with propofol N/A 03/11/2014    Procedure: ESOPHAGOGASTRODUODENOSCOPY (EGD) WITH PROPOFOL;  Surgeon: Lafayette Dragon, MD;  Location: [facility] ENDOSCOPY;  Service: Endoscopy;  Laterality: N/A;  . Tooth extraction      There were no vitals filed for this visit.  Visit Diagnosis:  Neck pain  Arm numbness  Postural instability  Generalized muscle weakness      Subjective Assessment - 12/18/14 1406    Subjective Pt. reports a 7/10 posterior neck pain initially.  Occasional dizziness reported (2-3/day) with no falls.     Patient Stated Goals Address neck pain and arm numbness   Pain Score 7    Pain Location Neck   Pain Orientation Posterior   Pain Descriptors / Indicators Aching;Constant   Pain Type Chronic pain   Pain Radiating Towards pain radiating down arm toward fingers.     Pain Onset More than a month ago   Pain Frequency Constant   Aggravating Factors  overhead activities    Pain Relieving Factors not stated.   Multiple Pain Sites No      Manual Therapy (to decompress cervical vertebrae to decrease pain level): - manual traction in supine with slight cervical flexion (2 min); pt. required verbal cues to relax neck muscles/release guarding. - manual traction with slight rotation to R and L (2 min each way); pt. required verbal cues to relax neck muscles/release guarding.  Stretching (to decrease anterior chest tightness and increase scapular  retraction ROM): - standing pectoral major stretch on wall PROM (2 x 30 sec each arm) (to increase bilateral shoulder horizontal abduction ROM to decreased rounding of shoulders)  - corner wall stretch x 30 sec; provided verbal cues for rationale, technique and tactile cues for facilitation; add to HEP next session.    Therex (to increase scapular retraction strength and improve posture awareness): Pt. educated on purpose of scapular retraction exercise and posture training: - shoulder circle  shrugs 2 x 10 reps; pt. verbalized increased pain with movement.  - sitting head turns to R and L x 5 each way (3 sec hold); pt. verbalized pain with rotation to the R at 20 degrees.  - sitting scapular retraction 4 x 10 reps with yellow TB; pt. required thorough verbal description with visual demonstration by SPTA to understand exercise.          [facility] Adult PT Treatment/Exercise - 12/18/14 1410    Transfers   Transfers Sit to Stand;Stand to Sit   Sit to Stand 6: Modified independent (Device/Increase time)   Stand to Sit 6: Modified independent (Device/Increase time)   Transfer Cueing no cueing required; pt. did report mild dizziness upon standing x 2    Comments pt. demo'd slight dizziness with sit to stand that subsided within a few seconds.               PT Education - 12/18/14 1411    Education Details Corner stretch verbally described and demonstrated by PTA today with return demo by pt.  Issue HEP for corner stretch next visit.     Person(s) Educated Patient   Methods Explanation;Demonstration;Tactile cues;Verbal cues   Comprehension Verbalized understanding;Returned demonstration;Tactile cues required          PT Short Term Goals - 12/12/14 1254    PT SHORT TERM GOAL #1   Title The patient will be independent with HEP for postural flexibility, neck stretching, and postural stabilization.   Baseline Target date 01/10/2015   Time 4   Period Weeks   PT SHORT TERM GOAL #2    Title The patient will improve neck ROM flexion/extension from 8 degrees to > or equal to 18 degrees.   Baseline Target date 01/10/2015   Time 4   Period Weeks   PT SHORT TERM GOAL #3   Title The aptient will improve R grip from 10 lbs to > or equal to 15 lbs. and L grip from 5 lbs to > or equal to 10 lbs.   Baseline Target date 01/10/2015   Time 4   Period Weeks   PT SHORT TERM GOAL #4   Title The patient will report pain < or equal to 3/10 at rest.   Baseline Target date 01/10/2015   Time 4   Period Weeks           PT Long Term Goals - 12/12/14 1301    PT LONG TERM GOAL #1   Title The patient will  improve neck disability index by > or equal to 15% (from 54% baseline).   Baseline Target date 01/31/2015   Time 6   Period Weeks   PT LONG TERM GOAL #2   Title The patient will imrpove neck rotation R from 34 deg to 45 deg and L from 22 deg to 40 deg.   Baseline Target date 01/31/2015   Time 6   Period Weeks   PT LONG TERM GOAL #3   Title The patient will improve shoulder A/ROM to > or equal to 140 degrees bilaterally in seated position.   Baseline Target date 01/31/2015   Time 6   Period Weeks   PT LONG TERM GOAL #4   Title The patient will be able to reach overhead to fix hair without reports of arms going numb.   Baseline Target date 01/31/2015   Time 6   Period Weeks               Plan -  12/18/14 1413    Clinical Impression Statement Pt. tolerated treatment well reporting a reduction of pain from 7/10 to 5/10 following cervical traction and stretching.  Pt. continues to demonstrate forward flexed posture with reduced UE ROM bilaterally and would benefit from further posture correction interventions.     Pt will benefit from skilled therapeutic intervention in order to improve on the following deficits Abnormal gait;Decreased activity tolerance;Decreased mobility;Decreased strength;Decreased range of motion;Impaired flexibility;Dizziness;Pain   Rehab Potential Good    PT Frequency 2x / week   PT Duration 6 weeks   PT Treatment/Interventions ADLs/Self Care Home Management;Balance training;Neuromuscular re-education;Therapeutic activities;Functional mobility training;Patient/family education;Therapeutic exercise;Manual techniques;Cryotherapy;Traction;Passive range of motion   PT Next Visit Plan Continue checking HEP, postural education, scapular retraction strengthening, and anterior chest stretching.  Issue wall stretch HEP next session.    Consulted and Agree with Plan of Care Patient        Problem List Patient Active Problem List   Diagnosis Date Noted  . Common migraine 05/14/2014  . Abnormality of gait 05/14/2014  . Memory change 05/14/2014  . Aneurysm, cerebral, nonruptured 05/14/2014  . Cramp of limb-Left neck 05/10/2014  . Hematemesis with nausea   . Vomiting blood   . Dizziness and giddiness 09/21/2013  . Atypical chest pain 07/18/2013  . Unstable angina ([place]) 04/09/2013  . Carotid artery stenosis, symptomatic 03/20/2013  . Chronic diastolic CHF (congestive heart failure) ([place]) 09/23/2012  . Cerebrovascular disease 07/10/2012  . Cerebral artery occlusion with cerebral infarction ([place]) 07/10/2012  . Mitral regurgitation 04/15/2012  . TIA (transient ischemic attack) 04/15/2012  . Occlusion and stenosis of carotid artery without mention of cerebral infarction 08/18/2011  . Bariatric surgery status 06/17/2011  . Speech abnormality 03/22/2011  . Dyspnea 02/24/2011  . PAPILLARY MUSCLE DYSFUNCTION, NON-RHEUMATIC 10/09/2008  . UNSPECIFIED VITAMIN D DEFICIENCY 10/24/2007  . MYOCARDIAL INFARCTION, HX OF 10/24/2007  . PERSISTENT VOMITING 10/24/2007  . OSTEOARTHRITIS 10/24/2007  . MIGRAINES, HX OF 10/24/2007  . DM 01/16/2007  . Hyperlipemia 01/16/2007  . Obesity-post failed open gastroplasty 1984  01/16/2007  . OBSTRUCTIVE SLEEP APNEA 01/16/2007  . Essential hypertension 01/16/2007  . Coronary atherosclerosis 01/16/2007  . ASTHMA  01/16/2007  . GERD 01/16/2007  . VENTRAL HERNIA 01/16/2007    Bess [place]vest 12/18/2014, 2:28 PM  [place], [place]  Name: [name] [name] [name]: [id number] Date of Birth: Mar 09, 1945  This note has been reviewed and edited by supervising CI.  Willow Ora, PTA, [facility] [address], [place], [place] [address] [phone] 12/19/2014, 2:50 PM

## 2014-12-18 NOTE — Telephone Encounter (Signed)
Sent refill to [facility] - confirmed with pt that she uses Praluent syringes rather than pens. She reports that she was started on the pens but that they were difficult to administer with her arthritis and that she prefers the syringes.

## 2014-12-18 NOTE — Patient Instructions (Signed)
Pt. Instructed on importance of corner stretch with HEP to increase ROM with scapular retraction and posture improvement.

## 2014-12-18 NOTE — Telephone Encounter (Signed)
Pt c/o medication issue: 1. Name of Medication: pralulent   *STAT* If patient is at the pharmacy, call can be transferred to refill team.   1. Which medications need to be refilled? (please list name of each medication and dose if known) Praluent   2. Which pharmacy/location (including street and city if local pharmacy) is medication to be sent to? [facility] in [place] [phone]  3. Do they need a 30 day or 90 day supply? It usually comes in packs of two and it covers 1 month

## 2014-12-20 ENCOUNTER — Other Ambulatory Visit: Payer: Self-pay | Admitting: Cardiology

## 2014-12-23 ENCOUNTER — Encounter: Payer: Self-pay | Admitting: Physical Therapy

## 2014-12-23 ENCOUNTER — Ambulatory Visit: Payer: Medicare Other | Admitting: Physical Therapy

## 2014-12-23 DIAGNOSIS — M542 Cervicalgia: Secondary | ICD-10-CM

## 2014-12-23 DIAGNOSIS — M6281 Muscle weakness (generalized): Secondary | ICD-10-CM

## 2014-12-23 DIAGNOSIS — R200 Anesthesia of skin: Secondary | ICD-10-CM

## 2014-12-23 DIAGNOSIS — R293 Abnormal posture: Secondary | ICD-10-CM

## 2014-12-23 NOTE — Patient Instructions (Signed)
Upper Limb Neural Tension: Median I    Stand with forearm flat on wall, fingers up. Bend elbow _~30-40_ degrees, rotate away from your arm and Hold _20_ seconds.Hold ____ seconds. Repeat __3__ times each arm. Do __1__ sets per session. Do _1-2__ sessions per day.  http://orth.exer.us/402   Copyright  VHI. All rights reserved.  Flexibility: Corner Stretch    Standing in corner with hands just above shoulder level and feet __6-7__ inches from corner, lean forward until a comfortable stretch is felt across chest. Hold _30__ seconds. Repeat __3__ times per set. Do __1 sets per session. Do _1-2___ sessions per day.  http://orth.exer.us/343   Copyright  VHI. All rights reserved.

## 2014-12-23 NOTE — Therapy (Signed)
[facility] [address] [place], [place], [address] Phone: [phone]   Fax:  [phone]  Physical Therapy Treatment  Patient Details  Name: [name] [name] [name]: [id number] Date of Birth: 07-24-1945 Referring Provider: Lenor Coffin, MD  Encounter Date: 12/23/2014      PT End of Session - 12/23/14 1433    Visit Number 4   Number of Visits 12   Date for PT Re-Evaluation 01/31/15   Authorization Type G code every 10th visit   PT Start Time [id number]   PT Stop Time 1400   PT Time Calculation (min) 43 min   Equipment Utilized During Treatment Gait belt   Activity Tolerance Patient tolerated treatment well   Behavior During Therapy [facility] for tasks assessed/performed      Past Medical History  Diagnosis Date  . Vomiting     persistent  . Asthma   . Ventral hernia     hx of  . GERD (gastroesophageal reflux disease)   . Obesity 01-2010  . Hyperlipidemia   . Hypertension   . Irregular heart beat   . Coronary artery disease   . Heart murmur   . Anxiety   . PONV (postoperative nausea and vomiting)   . Peripheral vascular disease (HCC)     ? numbness, tingling arms and legs  . Anemia     hx  . H/O hiatal hernia   . Neuromuscular disorder ([place])     ?  Marland Kitchen Dysrhythmia   . Mental disorder     hx of confusion  . CHF (congestive heart failure) ([place])   . Anginal pain ([place])   . Pneumonia 2000's    "once"  . Chronic bronchitis ([place])     "off and on all the time" (07/18/2013)  . Claustrophobia   . Type II diabetes mellitus ([place])   . Chronic lower back pain   . Other and unspecified angina pectoris   . Obstructive sleep apnea     "can't wear machine; I have claustrophobia" (07/18/2013)  . Osteoarthritis     "knees and hands" (07/18/2013)  . Bundle branch block, left     chronic/notes 07/18/2013  . Vomiting blood   . Myocardial infarction ([place])     04/1999, 02/2000, 01/2005; 2011; 2014  . Shortness of breath      with exertion  . Chronic kidney disease     frequency, sses dr Jamal Maes every 4 to 6 months  . Migraine     "used to have them really bad; kind of eased up now; down to 1 q couple months" (07/18/2013)  . Headache     "probably 4 days/wk" (07/18/2013)  . Leg cramps     both legs at times  . Stroke ([place]) 03/22/12    right side brain; denies residual on 07/18/2013)  . Stroke [facility]) Oct. 2015    Affected pt.s balance  . Cancer [facility]) April 2016    BCC,  Melanoma  . Common migraine 05/14/2014  . Abnormality of gait 05/14/2014  . Memory change 05/14/2014    Past Surgical History  Procedure Laterality Date  . Tumor removal  1970's    ovarian  . Tibia fracture surgery Right 1970's    rods and pins  . Gastric bypass  1984    "stapeling"  . Total abdominal hysterectomy  1970's    w/ appendectomy  . Ankle fracture surgery Left 1970's  . Fracture surgery    . Esophagogastroduodenoscopy  08/03/2011  Procedure: ESOPHAGOGASTRODUODENOSCOPY (EGD);  Surgeon: Shann Medal, MD;  Location: [facility];  Service: General;  Laterality: N/A;  . Gall stone removal  05/2003  . Root canal  10/2000  . Appendectomy  1970's    w/hysterectomy  . Coronary angioplasty with stent placement  01,02,05,06,07,08,11; 04/24/2013    "I've probably got ~ 10 stents by now" (04/24/2013)  . Coronary angioplasty with stent placement  06/13/2013    "got 4 stents today" (06/13/2013)  . Cardiac catheterization  10/10/2012    Dr [name].  . Carotid endarterectomy Left 03/2013  . Cerebral angiogram N/A 04/05/2011    Procedure: CEREBRAL ANGIOGRAM;  Surgeon: Angelia Mould, MD;  Location: [facility] CATH LAB;  Service: Cardiovascular;  Laterality: N/A;  . Carotid stent insertion Left 03/20/2013    Procedure: CAROTID STENT INSERTION;  Surgeon: [name], MD;  Location: [facility] CATH LAB;  Service: Cardiovascular;  Laterality: Left;  internal carotid  . Percutaneous stent intervention N/A 04/24/2013    Procedure: PERCUTANEOUS STENT  INTERVENTION;  Surgeon: [name], MD;  Location: [facility] CATH LAB;  Service: Cardiovascular;  Laterality: N/A;  . Percutaneous coronary stent intervention (pci-s) N/A 06/13/2013    Procedure: PERCUTANEOUS CORONARY STENT INTERVENTION (PCI-S);  Surgeon: [name], MD;  Location: [facility] CATH LAB;  Service: Cardiovascular;  Laterality: N/A;  . Coronary artery bypass graft  1220/11    "CABG X5"  . Cholecystectomy  2004  . Hernia repair  2004    "in my stomach; had OR on it twice", wire mesh on 1 hernia  . Esophagogastroduodenoscopy (egd) with propofol N/A 03/11/2014    Procedure: ESOPHAGOGASTRODUODENOSCOPY (EGD) WITH PROPOFOL;  Surgeon: Lafayette Dragon, MD;  Location: [facility] ENDOSCOPY;  Service: Endoscopy;  Laterality: N/A;  . Tooth extraction      There were no vitals filed for this visit.  Visit Diagnosis:  Neck pain  Arm numbness  Postural instability  Generalized muscle weakness      Subjective Assessment - 12/23/14 1426    Subjective Pt. reports decreased neck pain from last visit but increased tingling and numbness in the LUE. pt. continues to report occasional dizziness with no falls.     Patient Stated Goals Address neck pain and arm numbness   Currently in Pain? Yes   Pain Score 5    Pain Location Neck   Pain Orientation Posterior   Pain Descriptors / Indicators Aching;Constant   Pain Type Chronic pain   Pain Radiating Towards pain radiating down arm into fingers especially on LUE.     Pain Onset More than a month ago   Pain Frequency Constant   Aggravating Factors  overhead activities.     Pain Relieving Factors not stated.    Effect of Pain on Daily Activities pt. reports a decreased activity level since onset of pain.        Manual Therapy (to decompress cervical vertebrae to decrease pain level): - manual traction in supine with slight cervical flexion (2 min); pt. required verbal cues to relax neck muscles/release guarding. - manual traction with slight  rotation to R and L (2 min each way); pt. required verbal cues to relax neck muscles/release guarding.  Stretching (to decrease anterior chest tightness and increase scapular retraction ROM): - standing pectoral major stretch on wall PROM (2 x 30 sec each arm) (to increase bilateral shoulder horizontal abduction ROM to decreased rounding of shoulders)  - corner wall stretch x 30 sec; provided verbal cues for rationale, technique and tactile cues  for facilitation; add to HEP next session.   Therex (to increase scapular retraction strength and improve posture awareness): Pt. educated on purpose of scapular retraction exercise and posture training: - sitting head turns to R and L x 5 each way (3 sec hold); pt. verbalized pain with rotation to the R at 20 degrees.  - sitting scapular retraction 4 x 10 reps with yellow TB; pt. required thorough verbal description with visual demonstration by SPTA to understand exercise.  Left shoulder passive ROM abduction/flexion, External/Internal rotation in supine x 30 sec each; pt. Continues to demo restricted ROM especially in abduction.            [place] Adult PT Treatment/Exercise - 12/23/14 1431    Transfers   Transfers Sit to Stand;Stand to Sit   Sit to Stand 6: Modified independent (Device/Increase time)   Stand to Sit 6: Modified independent (Device/Increase time)   Transfer Cueing no verbal cues required; pt. continues to demo stiffness in the neck with supine > sit > stand.   Comments pt. contines to report and demo occasional dizziness with gait.               PT Short Term Goals - 12/12/14 1254    PT SHORT TERM GOAL #1   Title The patient will be independent with HEP for postural flexibility, neck stretching, and postural stabilization.   Baseline Target date 01/10/2015   Time 4   Period Weeks   PT SHORT TERM GOAL #2   Title The patient will improve neck ROM flexion/extension from 8 degrees to > or equal to 18 degrees.   Baseline  Target date 01/10/2015   Time 4   Period Weeks   PT SHORT TERM GOAL #3   Title The aptient will improve R grip from 10 lbs to > or equal to 15 lbs. and L grip from 5 lbs to > or equal to 10 lbs.   Baseline Target date 01/10/2015   Time 4   Period Weeks   PT SHORT TERM GOAL #4   Title The patient will report pain < or equal to 3/10 at rest.   Baseline Target date 01/10/2015   Time 4   Period Weeks           PT Long Term Goals - 12/12/14 1301    PT LONG TERM GOAL #1   Title The patient will  improve neck disability index by > or equal to 15% (from 54% baseline).   Baseline Target date 01/31/2015   Time 6   Period Weeks   PT LONG TERM GOAL #2   Title The patient will imrpove neck rotation R from 34 deg to 45 deg and L from 22 deg to 40 deg.   Baseline Target date 01/31/2015   Time 6   Period Weeks   PT LONG TERM GOAL #3   Title The patient will improve shoulder A/ROM to > or equal to 140 degrees bilaterally in seated position.   Baseline Target date 01/31/2015   Time 6   Period Weeks   PT LONG TERM GOAL #4   Title The patient will be able to reach overhead to fix hair without reports of arms going numb.   Baseline Target date 01/31/2015   Time 6   Period Weeks           Plan - 12/23/14 1434    Clinical Impression Statement Pt. tolerated treatment well reporting a decrease in cervical pain from last visit  but was limited by weakness in the L UE.  Pt. reported difficulty gripping handles for scapular retraction. Today's visit focused on posture correction and scapular strengthening.  Corner and wall stretch issued to pt. in HEP today.       Pt will benefit from skilled therapeutic intervention in order to improve on the following deficits Abnormal gait;Decreased activity tolerance;Decreased mobility;Decreased strength;Decreased range of motion;Impaired flexibility;Dizziness;Pain   Rehab Potential Good   PT Frequency 2x / week   PT Duration 6 weeks   PT  Treatment/Interventions ADLs/Self Care Home Management;Balance training;Neuromuscular re-education;Therapeutic activities;Functional mobility training;Patient/family education;Therapeutic exercise;Manual techniques;Cryotherapy;Traction;Passive range of motion   PT Next Visit Plan Continue checking HEP, postural education, scapular retraction strengthening, and anterior chest stretching.  Mechanical cervical traction.    Consulted and Agree with Plan of Care Patient        Problem List Patient Active Problem List   Diagnosis Date Noted  . Common migraine 05/14/2014  . Abnormality of gait 05/14/2014  . Memory change 05/14/2014  . Aneurysm, cerebral, nonruptured 05/14/2014  . Cramp of limb-Left neck 05/10/2014  . Hematemesis with nausea   . Vomiting blood   . Dizziness and giddiness 09/21/2013  . Atypical chest pain 07/18/2013  . Unstable angina ([place]) 04/09/2013  . Carotid artery stenosis, symptomatic 03/20/2013  . Chronic diastolic CHF (congestive heart failure) ([place]) 09/23/2012  . Cerebrovascular disease 07/10/2012  . Cerebral artery occlusion with cerebral infarction ([place]) 07/10/2012  . Mitral regurgitation 04/15/2012  . TIA (transient ischemic attack) 04/15/2012  . Occlusion and stenosis of carotid artery without mention of cerebral infarction 08/18/2011  . Bariatric surgery status 06/17/2011  . Speech abnormality 03/22/2011  . Dyspnea 02/24/2011  . PAPILLARY MUSCLE DYSFUNCTION, NON-RHEUMATIC 10/09/2008  . UNSPECIFIED VITAMIN D DEFICIENCY 10/24/2007  . MYOCARDIAL INFARCTION, HX OF 10/24/2007  . PERSISTENT VOMITING 10/24/2007  . OSTEOARTHRITIS 10/24/2007  . MIGRAINES, HX OF 10/24/2007  . DM 01/16/2007  . Hyperlipemia 01/16/2007  . Obesity-post failed open gastroplasty 1984  01/16/2007  . OBSTRUCTIVE SLEEP APNEA 01/16/2007  . Essential hypertension 01/16/2007  . Coronary atherosclerosis 01/16/2007  . ASTHMA 01/16/2007  . GERD 01/16/2007  . VENTRAL HERNIA 01/16/2007     Bess Harvest 12/23/2014, 4:01 PM  Bess Harvest, [place]  Name: AMERAH BLOMGREN [name]: [id number] Date of Birth: May 18, 1945  This note has been reviewed and edited by supervising CI.  Willow Ora, PTA, [place] [address] [place], [place] [address] [phone] 12/23/2014, 4:15 PM

## 2014-12-24 ENCOUNTER — Encounter: Payer: Self-pay | Admitting: Physical Therapy

## 2014-12-24 ENCOUNTER — Ambulatory Visit: Payer: Medicare Other | Admitting: Physical Therapy

## 2014-12-24 DIAGNOSIS — R293 Abnormal posture: Secondary | ICD-10-CM

## 2014-12-24 DIAGNOSIS — R200 Anesthesia of skin: Secondary | ICD-10-CM

## 2014-12-24 DIAGNOSIS — M542 Cervicalgia: Secondary | ICD-10-CM

## 2014-12-24 DIAGNOSIS — M6281 Muscle weakness (generalized): Secondary | ICD-10-CM

## 2014-12-24 NOTE — Therapy (Signed)
[facility] [address] [place], [place], [address] Phone: [phone]   Fax:  [phone]  Physical Therapy Treatment  Patient Details  Name: [name] [name] [name]: [id number] Date of Birth: [birth year] Referring Provider: Lenor Coffin, MD  Encounter Date: 12/24/2014      PT End of Session - 12/24/14 1506    Visit Number 5   Number of Visits 12   Date for PT Re-Evaluation 01/31/15   Authorization Type G code every 10th visit   PT Start Time 1405   PT Stop Time 1447   PT Time Calculation (min) 42 min   Equipment Utilized During Treatment Gait belt   Activity Tolerance Patient tolerated treatment well   Behavior During Therapy [facility] for tasks assessed/performed      Past Medical History  Diagnosis Date  . Vomiting     persistent  . Asthma   . Ventral hernia     hx of  . GERD (gastroesophageal reflux disease)   . Obesity 01-2010  . Hyperlipidemia   . Hypertension   . Irregular heart beat   . Coronary artery disease   . Heart murmur   . Anxiety   . PONV (postoperative nausea and vomiting)   . Peripheral vascular disease (HCC)     ? numbness, tingling arms and legs  . Anemia     hx  . H/O hiatal hernia   . Neuromuscular disorder ([place])     ?  Marland Kitchen Dysrhythmia   . Mental disorder     hx of confusion  . CHF (congestive heart failure) ([place])   . Anginal pain ([place])   . Pneumonia 2000's    "once"  . Chronic bronchitis ([place])     "off and on all the time" (07/18/2013)  . Claustrophobia   . Type II diabetes mellitus ([place])   . Chronic lower back pain   . Other and unspecified angina pectoris   . Obstructive sleep apnea     "can't wear machine; I have claustrophobia" (07/18/2013)  . Osteoarthritis     "knees and hands" (07/18/2013)  . Bundle branch block, left     chronic/notes 07/18/2013  . Vomiting blood   . Myocardial infarction ([place])     04/1999, 02/2000, 01/2005; 2011; 2014  . Shortness of breath      with exertion  . Chronic kidney disease     frequency, sses dr Jamal Maes every 4 to 6 months  . Migraine     "used to have them really bad; kind of eased up now; down to 1 q couple months" (07/18/2013)  . Headache     "probably 4 days/wk" (07/18/2013)  . Leg cramps     both legs at times  . Stroke ([place]) 03/22/12    right side brain; denies residual on 07/18/2013)  . Stroke [facility]) Oct. 2015    Affected pt.s balance  . Cancer [facility]) April 2016    BCC,  Melanoma  . Common migraine 05/14/2014  . Abnormality of gait 05/14/2014  . Memory change 05/14/2014    Past Surgical History  Procedure Laterality Date  . Tumor removal  1970's    ovarian  . Tibia fracture surgery Right 1970's    rods and pins  . Gastric bypass  1984    "stapeling"  . Total abdominal hysterectomy  1970's    w/ appendectomy  . Ankle fracture surgery Left 1970's  . Fracture surgery    . Esophagogastroduodenoscopy  08/03/2011  Procedure: ESOPHAGOGASTRODUODENOSCOPY (EGD);  Surgeon: Shann Medal, MD;  Location: [facility];  Service: General;  Laterality: N/A;  . Gall stone removal  05/2003  . Root canal  10/2000  . Appendectomy  1970's    w/hysterectomy  . Coronary angioplasty with stent placement  01,02,05,06,07,08,11; 04/24/2013    "I've probably got ~ 10 stents by now" (04/24/2013)  . Coronary angioplasty with stent placement  06/13/2013    "got 4 stents today" (06/13/2013)  . Cardiac catheterization  10/10/2012    Dr [name].  . Carotid endarterectomy Left 03/2013  . Cerebral angiogram N/A 04/05/2011    Procedure: CEREBRAL ANGIOGRAM;  Surgeon: Angelia Mould, MD;  Location: [facility] CATH LAB;  Service: Cardiovascular;  Laterality: N/A;  . Carotid stent insertion Left 03/20/2013    Procedure: CAROTID STENT INSERTION;  Surgeon: [name], MD;  Location: [facility] CATH LAB;  Service: Cardiovascular;  Laterality: Left;  internal carotid  . Percutaneous stent intervention N/A 04/24/2013    Procedure: PERCUTANEOUS STENT  INTERVENTION;  Surgeon: [name], MD;  Location: [facility] CATH LAB;  Service: Cardiovascular;  Laterality: N/A;  . Percutaneous coronary stent intervention (pci-s) N/A 06/13/2013    Procedure: PERCUTANEOUS CORONARY STENT INTERVENTION (PCI-S);  Surgeon: [name], MD;  Location: [facility] CATH LAB;  Service: Cardiovascular;  Laterality: N/A;  . Coronary artery bypass graft  1220/11    "CABG X5"  . Cholecystectomy  2004  . Hernia repair  2004    "in my stomach; had OR on it twice", wire mesh on 1 hernia  . Esophagogastroduodenoscopy (egd) with propofol N/A 03/11/2014    Procedure: ESOPHAGOGASTRODUODENOSCOPY (EGD) WITH PROPOFOL;  Surgeon: Lafayette Dragon, MD;  Location: [facility] ENDOSCOPY;  Service: Endoscopy;  Laterality: N/A;  . Tooth extraction      There were no vitals filed for this visit.  Visit Diagnosis:  Neck pain  Arm numbness  Postural instability  Generalized muscle weakness      Subjective Assessment - 12/24/14 1501    Subjective Pt. reports decreased tingling and numbness in L UE and hand from yesterday's visit.  pt. reports continued soreness in neck.  No falls or other complaints reported.     Patient Stated Goals Address neck pain and arm numbness   Currently in Pain? Yes   Pain Score 6    Pain Location Neck   Pain Orientation Posterior   Pain Descriptors / Indicators Aching;Constant   Pain Type Chronic pain   Pain Radiating Towards pain radiating down arms into fingers.     Pain Onset More than a month ago   Pain Frequency Constant   Aggravating Factors  overhead activities.     Pain Relieving Factors not stated.     Effect of Pain on Daily Activities pt. reports a decreased activity level since onset of pain.     Multiple Pain Sites No      Mechanical traction (to decompress cervical vertebrae to decrease pain level): - mechanical traction applied using the EMPI home cervical unit  for a total of 8 minutes at progressing trials of: 15 lbs, 20 lbs, 22 lbs, 24  lbs; pt. tolerated progression well but reported pain localized further down back from neck following treatment.    Therex (to increase scapular retraction strength and improve posture awareness): Pt. educated on purpose of scapular retraction exercise and posture training: - sitting head turns to R and L x 5 each way (3 sec hold); pt. verbalized pain with rotation to the R  at 20 degrees.  - sitting scapular retraction 4 x 15 reps with yellow TB; pt. required thorough verbal description with visual demonstration by SPTA to understand exercise.  - sitting shoulder shrugs 2 x 15 with retraction; verbal cues provided for technique.    Stretching (to increase cervical ROM to relieve pain): - cervical lateral bending 2 x 30 sec each way; verbal cues provided to prevent rotation.          [place] Adult PT Treatment/Exercise - 12/24/14 1505    Transfers   Transfers Sit to Stand;Stand to Sit   Sit to Stand 6: Modified independent (Device/Increase time)   Stand to Sit 6: Modified independent (Device/Increase time)   Transfer Cueing pt. continues to demo stiffness in neck with supine > sit > stand.    Comments pt. contines to report and demo occasional dizziness with gait.              PT Short Term Goals - 12/12/14 1254    PT SHORT TERM GOAL #1   Title The patient will be independent with HEP for postural flexibility, neck stretching, and postural stabilization.   Baseline Target date 01/10/2015   Time 4   Period Weeks   PT SHORT TERM GOAL #2   Title The patient will improve neck ROM flexion/extension from 8 degrees to > or equal to 18 degrees.   Baseline Target date 01/10/2015   Time 4   Period Weeks   PT SHORT TERM GOAL #3   Title The aptient will improve R grip from 10 lbs to > or equal to 15 lbs. and L grip from 5 lbs to > or equal to 10 lbs.   Baseline Target date 01/10/2015   Time 4   Period Weeks   PT SHORT TERM GOAL #4   Title The patient will report pain < or equal to 3/10 at  rest.   Baseline Target date 01/10/2015   Time 4   Period Weeks           PT Long Term Goals - 12/12/14 1301    PT LONG TERM GOAL #1   Title The patient will  improve neck disability index by > or equal to 15% (from 54% baseline).   Baseline Target date 01/31/2015   Time 6   Period Weeks   PT LONG TERM GOAL #2   Title The patient will imrpove neck rotation R from 34 deg to 45 deg and L from 22 deg to 40 deg.   Baseline Target date 01/31/2015   Time 6   Period Weeks   PT LONG TERM GOAL #3   Title The patient will improve shoulder A/ROM to > or equal to 140 degrees bilaterally in seated position.   Baseline Target date 01/31/2015   Time 6   Period Weeks   PT LONG TERM GOAL #4   Title The patient will be able to reach overhead to fix hair without reports of arms going numb.   Baseline Target date 01/31/2015   Time 6   Period Weeks               Plan - 12/24/14 1507    Clinical Impression Statement Pt. tolerated mechanical traction well but reported pain radiating inferiorly down the back from the neck following treatment.  Tingling/numbness and weakness in LUE was diminished from last visit.  Pt. progressing toward goals.     Pt will benefit from skilled therapeutic intervention in order to improve on  the following deficits Abnormal gait;Decreased activity tolerance;Decreased mobility;Decreased strength;Decreased range of motion;Impaired flexibility;Dizziness;Pain   Rehab Potential Good   PT Frequency 2x / week   PT Duration 6 weeks   PT Treatment/Interventions ADLs/Self Care Home Management;Balance training;Neuromuscular re-education;Therapeutic activities;Functional mobility training;Patient/family education;Therapeutic exercise;Manual techniques;Cryotherapy;Traction;Passive range of motion   PT Next Visit Plan Continue applying mechanical traction next visit per pt. tolerance.  Continue checking HEP, postural education, scapular retraction strengthening, and anterior  chest stretching. Consider transferring to ortho clinic if home traction unit working for placement on larger mechanical traction unit.   Consulted and Agree with Plan of Care Patient        Problem List Patient Active Problem List   Diagnosis Date Noted  . Common migraine 05/14/2014  . Abnormality of gait 05/14/2014  . Memory change 05/14/2014  . Aneurysm, cerebral, nonruptured 05/14/2014  . Cramp of limb-Left neck 05/10/2014  . Hematemesis with nausea   . Vomiting blood   . Dizziness and giddiness 09/21/2013  . Atypical chest pain 07/18/2013  . Unstable angina ([place]) 04/09/2013  . Carotid artery stenosis, symptomatic 03/20/2013  . Chronic diastolic CHF (congestive heart failure) ([place]) 09/23/2012  . Cerebrovascular disease 07/10/2012  . Cerebral artery occlusion with cerebral infarction ([place]) 07/10/2012  . Mitral regurgitation 04/15/2012  . TIA (transient ischemic attack) 04/15/2012  . Occlusion and stenosis of carotid artery without mention of cerebral infarction 08/18/2011  . Bariatric surgery status 06/17/2011  . Speech abnormality 03/22/2011  . Dyspnea 02/24/2011  . PAPILLARY MUSCLE DYSFUNCTION, NON-RHEUMATIC 10/09/2008  . UNSPECIFIED VITAMIN D DEFICIENCY 10/24/2007  . MYOCARDIAL INFARCTION, HX OF 10/24/2007  . PERSISTENT VOMITING 10/24/2007  . OSTEOARTHRITIS 10/24/2007  . MIGRAINES, HX OF 10/24/2007  . DM 01/16/2007  . Hyperlipemia 01/16/2007  . Obesity-post failed open gastroplasty 1984  01/16/2007  . OBSTRUCTIVE SLEEP APNEA 01/16/2007  . Essential hypertension 01/16/2007  . Coronary atherosclerosis 01/16/2007  . ASTHMA 01/16/2007  . GERD 01/16/2007  . VENTRAL HERNIA 01/16/2007    Bess Harvest 12/24/2014, 3:13 PM  Bess Harvest, [place]  Name: LAKEVA CLEMSON [name]: [id number] Date of Birth: 12/16/1945  This note has been reviewed and edited by supervising CI.  Willow Ora, PTA, [place] [address], [place], [place]  [id number] [phone] 12/25/2014, 12:32 PM

## 2014-12-30 ENCOUNTER — Encounter: Payer: Medicare Other | Attending: Cardiology | Admitting: Dietician

## 2014-12-30 ENCOUNTER — Ambulatory Visit: Payer: Medicare Other | Admitting: Physical Therapy

## 2014-12-30 ENCOUNTER — Encounter: Payer: Self-pay | Admitting: Physical Therapy

## 2014-12-30 VITALS — Wt 286.0 lb

## 2014-12-30 DIAGNOSIS — Z794 Long term (current) use of insulin: Secondary | ICD-10-CM | POA: Diagnosis not present

## 2014-12-30 DIAGNOSIS — E118 Type 2 diabetes mellitus with unspecified complications: Secondary | ICD-10-CM | POA: Diagnosis not present

## 2014-12-30 DIAGNOSIS — M542 Cervicalgia: Secondary | ICD-10-CM | POA: Diagnosis not present

## 2014-12-30 DIAGNOSIS — E119 Type 2 diabetes mellitus without complications: Secondary | ICD-10-CM

## 2014-12-30 DIAGNOSIS — M6281 Muscle weakness (generalized): Secondary | ICD-10-CM

## 2014-12-30 DIAGNOSIS — R293 Abnormal posture: Secondary | ICD-10-CM

## 2014-12-30 DIAGNOSIS — I5032 Chronic diastolic (congestive) heart failure: Secondary | ICD-10-CM

## 2014-12-30 DIAGNOSIS — Z6841 Body Mass Index (BMI) 40.0 and over, adult: Secondary | ICD-10-CM | POA: Insufficient documentation

## 2014-12-30 DIAGNOSIS — Z713 Dietary counseling and surveillance: Secondary | ICD-10-CM | POA: Diagnosis not present

## 2014-12-30 DIAGNOSIS — R200 Anesthesia of skin: Secondary | ICD-10-CM

## 2014-12-30 NOTE — Therapy (Signed)
[facility] [address] [place], [place], [address] Phone: [phone]   Fax:  [phone]  Physical Therapy Treatment  Patient Details  Name: [name] [name] [name]: [id number] Date of Birth: May 08, 1945 Referring Provider: Lenor Coffin, MD  Encounter Date: 12/30/2014      PT End of Session - 12/30/14 1610    Visit Number 6   Number of Visits 12   Date for PT Re-Evaluation 01/31/15   Authorization Type G code every 10th visit   PT Start Time 1315   PT Stop Time 1400   PT Time Calculation (min) 45 min   Activity Tolerance Patient tolerated treatment well   Behavior During Therapy [facility] for tasks assessed/performed      Past Medical History  Diagnosis Date  . Vomiting     persistent  . Asthma   . Ventral hernia     hx of  . GERD (gastroesophageal reflux disease)   . Obesity 01-2010  . Hyperlipidemia   . Hypertension   . Irregular heart beat   . Coronary artery disease   . Heart murmur   . Anxiety   . PONV (postoperative nausea and vomiting)   . Peripheral vascular disease (HCC)     ? numbness, tingling arms and legs  . Anemia     hx  . H/O hiatal hernia   . Neuromuscular disorder ([place])     ?  Marland Kitchen Dysrhythmia   . Mental disorder     hx of confusion  . CHF (congestive heart failure) ([place])   . Anginal pain ([place])   . Pneumonia 2000's    "once"  . Chronic bronchitis ([place])     "off and on all the time" (07/18/2013)  . Claustrophobia   . Type II diabetes mellitus ([place])   . Chronic lower back pain   . Other and unspecified angina pectoris   . Obstructive sleep apnea     "can't wear machine; I have claustrophobia" (07/18/2013)  . Osteoarthritis     "knees and hands" (07/18/2013)  . Bundle branch block, left     chronic/notes 07/18/2013  . Vomiting blood   . Myocardial infarction ([place])     04/1999, 02/2000, 01/2005; 2011; 2014  . Shortness of breath     with exertion  . Chronic kidney disease    frequency, sses dr Jamal Maes every 4 to 6 months  . Migraine     "used to have them really bad; kind of eased up now; down to 1 q couple months" (07/18/2013)  . Headache     "probably 4 days/wk" (07/18/2013)  . Leg cramps     both legs at times  . Stroke ([place]) 03/22/12    right side brain; denies residual on 07/18/2013)  . Stroke [facility]) Oct. 2015    Affected pt.s balance  . Cancer [facility]) April 2016    BCC,  Melanoma  . Common migraine 05/14/2014  . Abnormality of gait 05/14/2014  . Memory change 05/14/2014    Past Surgical History  Procedure Laterality Date  . Tumor removal  1970's    ovarian  . Tibia fracture surgery Right 1970's    rods and pins  . Gastric bypass  1984    "stapeling"  . Total abdominal hysterectomy  1970's    w/ appendectomy  . Ankle fracture surgery Left 1970's  . Fracture surgery    . Esophagogastroduodenoscopy  08/03/2011    Procedure: ESOPHAGOGASTRODUODENOSCOPY (EGD);  Surgeon: Fenton Malling  [name], MD;  Location: [facility];  Service: General;  Laterality: N/A;  . Gall stone removal  05/2003  . Root canal  10/2000  . Appendectomy  1970's    w/hysterectomy  . Coronary angioplasty with stent placement  01,02,05,06,07,08,11; 04/24/2013    "I've probably got ~ 10 stents by now" (04/24/2013)  . Coronary angioplasty with stent placement  06/13/2013    "got 4 stents today" (06/13/2013)  . Cardiac catheterization  10/10/2012    Dr [name].  . Carotid endarterectomy Left 03/2013  . Cerebral angiogram N/A 04/05/2011    Procedure: CEREBRAL ANGIOGRAM;  Surgeon: Angelia Mould, MD;  Location: [facility] CATH LAB;  Service: Cardiovascular;  Laterality: N/A;  . Carotid stent insertion Left 03/20/2013    Procedure: CAROTID STENT INSERTION;  Surgeon: [name], MD;  Location: [facility] CATH LAB;  Service: Cardiovascular;  Laterality: Left;  internal carotid  . Percutaneous stent intervention N/A 04/24/2013    Procedure: PERCUTANEOUS STENT INTERVENTION;  Surgeon: [name], MD;   Location: [facility] CATH LAB;  Service: Cardiovascular;  Laterality: N/A;  . Percutaneous coronary stent intervention (pci-s) N/A 06/13/2013    Procedure: PERCUTANEOUS CORONARY STENT INTERVENTION (PCI-S);  Surgeon: [name], MD;  Location: [facility] CATH LAB;  Service: Cardiovascular;  Laterality: N/A;  . Coronary artery bypass graft  1220/11    "CABG X5"  . Cholecystectomy  2004  . Hernia repair  2004    "in my stomach; had OR on it twice", wire mesh on 1 hernia  . Esophagogastroduodenoscopy (egd) with propofol N/A 03/11/2014    Procedure: ESOPHAGOGASTRODUODENOSCOPY (EGD) WITH PROPOFOL;  Surgeon: Lafayette Dragon, MD;  Location: [facility] ENDOSCOPY;  Service: Endoscopy;  Laterality: N/A;  . Tooth extraction      There were no vitals filed for this visit.  Visit Diagnosis:  Neck pain  Arm numbness  Postural instability  Generalized muscle weakness      Subjective Assessment - 12/30/14 1606    Subjective Pt. reports tingling and numbness in bilateral UE on and off throughout the day.  pt. reported a 6/10 pain in the upper neck initially but stated, "the machine traction made my neck sore initially but it felt better later on.".    Currently in Pain? Yes   Pain Score 6    Pain Location Neck   Pain Orientation Upper;Posterior   Pain Descriptors / Indicators Aching;Constant   Pain Type Chronic pain   Pain Radiating Towards pain radiating down arms into fingers of both hands.     Pain Onset More than a month ago   Pain Frequency Constant   Aggravating Factors  overhead activities.     Pain Relieving Factors not stated.   Multiple Pain Sites No       Cervical Traction (to decompress cervical vertebrae to decrease pain level): - manual traction 2 min in 20 degrees cervical flexion; pt. demo'd decreased muscle guarding so continued to mechanical traction.   - mechanical traction - applied using the EMPI home cervical unit for a total of 8 minutes at progressing trials of: 2 min 8lb, 2 min  12lbs, 2 min 16lbs, 2 min 20lbs, 2 min decompresion 0 lbs; pt. reports slight dizziness upon sitting following traction but also reported a decrease in neck pain from 6/10 > 4/10.    Therex (to increase scapular retraction strength and improve posture awareness): Pt. educated on purpose of scapular retraction exercise and posture training: - sitting scapular retraction 3 x 15 reps with blue TB;  verbal cues provided for full ROM.  - sitting shoulder shrugs 2 x 15 with retraction; verbal cues provided for technique.   Stretching (to increase cervical/retraction ROM to relieve pain): - cervical lateral bending 2 x 30 sec each way; verbal cues provided to prevent rotation.  - corner stretch for pectoralis major; pt. Issued HEP for corner stretch in past session, reported adherence, but failed to demo understanding of stretch during session.           PT Short Term Goals - 12/12/14 1254    PT SHORT TERM GOAL #1   Title The patient will be independent with HEP for postural flexibility, neck stretching, and postural stabilization.   Baseline Target date 01/10/2015   Time 4   Period Weeks   PT SHORT TERM GOAL #2   Title The patient will improve neck ROM flexion/extension from 8 degrees to > or equal to 18 degrees.   Baseline Target date 01/10/2015   Time 4   Period Weeks   PT SHORT TERM GOAL #3   Title The aptient will improve R grip from 10 lbs to > or equal to 15 lbs. and L grip from 5 lbs to > or equal to 10 lbs.   Baseline Target date 01/10/2015   Time 4   Period Weeks   PT SHORT TERM GOAL #4   Title The patient will report pain < or equal to 3/10 at rest.   Baseline Target date 01/10/2015   Time 4   Period Weeks           PT Long Term Goals - 12/12/14 1301    PT LONG TERM GOAL #1   Title The patient will  improve neck disability index by > or equal to 15% (from 54% baseline).   Baseline Target date 01/31/2015   Time 6   Period Weeks   PT LONG TERM GOAL #2   Title The  patient will imrpove neck rotation R from 34 deg to 45 deg and L from 22 deg to 40 deg.   Baseline Target date 01/31/2015   Time 6   Period Weeks   PT LONG TERM GOAL #3   Title The patient will improve shoulder A/ROM to > or equal to 140 degrees bilaterally in seated position.   Baseline Target date 01/31/2015   Time 6   Period Weeks   PT LONG TERM GOAL #4   Title The patient will be able to reach overhead to fix hair without reports of arms going numb.   Baseline Target date 01/31/2015   Time 6   Period Weeks           Plan - 12/30/14 1611    Clinical Impression Statement pt. reported mechanical traction decreased her upper neck pain from last session and todays session.  Skilled session began with mechanical cervical traction and followed op with posture correcting activities.  pt. progressing toward goals.     Pt will benefit from skilled therapeutic intervention in order to improve on the following deficits Abnormal gait;Decreased activity tolerance;Decreased mobility;Decreased strength;Decreased range of motion;Impaired flexibility;Dizziness;Pain   Rehab Potential Good   PT Frequency 2x / week   PT Duration 6 weeks   PT Treatment/Interventions ADLs/Self Care Home Management;Balance training;Neuromuscular re-education;Therapeutic activities;Functional mobility training;Patient/family education;Therapeutic exercise;Manual techniques;Cryotherapy;Traction;Passive range of motion   PT Next Visit Plan Check for effectiveness of mechanical traction and continue toward goals.     Consulted and Agree with Plan of Care Patient  Problem List Patient Active Problem List   Diagnosis Date Noted  . Common migraine 05/14/2014  . Abnormality of gait 05/14/2014  . Memory change 05/14/2014  . Aneurysm, cerebral, nonruptured 05/14/2014  . Cramp of limb-Left neck 05/10/2014  . Hematemesis with nausea   . Vomiting blood   . Dizziness and giddiness 09/21/2013  . Atypical chest pain  07/18/2013  . Unstable angina ([place]) 04/09/2013  . Carotid artery stenosis, symptomatic 03/20/2013  . Chronic diastolic CHF (congestive heart failure) ([place]) 09/23/2012  . Cerebrovascular disease 07/10/2012  . Cerebral artery occlusion with cerebral infarction ([place]) 07/10/2012  . Mitral regurgitation 04/15/2012  . TIA (transient ischemic attack) 04/15/2012  . Occlusion and stenosis of carotid artery without mention of cerebral infarction 08/18/2011  . Bariatric surgery status 06/17/2011  . Speech abnormality 03/22/2011  . Dyspnea 02/24/2011  . PAPILLARY MUSCLE DYSFUNCTION, NON-RHEUMATIC 10/09/2008  . UNSPECIFIED VITAMIN D DEFICIENCY 10/24/2007  . MYOCARDIAL INFARCTION, HX OF 10/24/2007  . PERSISTENT VOMITING 10/24/2007  . OSTEOARTHRITIS 10/24/2007  . MIGRAINES, HX OF 10/24/2007  . DM 01/16/2007  . Hyperlipemia 01/16/2007  . Obesity-post failed open gastroplasty 1984  01/16/2007  . OBSTRUCTIVE SLEEP APNEA 01/16/2007  . Essential hypertension 01/16/2007  . Coronary atherosclerosis 01/16/2007  . ASTHMA 01/16/2007  . GERD 01/16/2007  . VENTRAL HERNIA 01/16/2007    Bess Harvest 12/31/2014, 12:50 PM  Bess Harvest, [place]  Name: ASLAN CRABLE [name]: [id number] Date of Birth: 09-Aug-1945  This note has been reviewed and edited by supervising CI. If mechanical traction continues to improve pt's symptoms, pt may benefit form a transfer to an ortho clinic near her home. They would be able to offer a more effective mechanical traction as our clinic utilizes a portable, home unit only.   Willow Ora, PTA, [facility] [address], [place], [place] [address] [phone] 12/31/2014, 12:58 PM

## 2014-12-30 NOTE — Progress Notes (Signed)
Diabetes Self-Management Education  Visit Type: Follow-up  Appt. Start Time: 1145 Appt. End Time: 1230  12/30/2014  [name], identified by name and date of birth, is a 69 y.o. female with a diagnosis of Diabetes: Type 2.   Patient is here alone.  She states that she did not follow up about her depression.  She is on Cymbalta but does not think that it is working.  She does not want to get out of the house or text grandchildren as much, appears tearful at times during this visit.  She continues to skip meals especially in the am partially due to her office is upstairs and it is too hard for her to climb stairs often.  She has hypoglycemia because of this as well as takes medication on an empty stomach which cause stomach upset.  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  Complains of increased fluid/leg swelling on Thanksgiving.  Weight is up 2 lbs.  ASSESSMENT  Weight 286 lb (129.729 kg). Body mass index is 51.44 kg/(m^2).      Diabetes Self-Management Education - 12/30/14 1411    Initial Visit   Diabetes Type Type 2   Are you currently following a meal plan? Yes   What type of meal plan do you follow? diabetic   Are you taking your medications as prescribed? Yes   Date Diagnosed 2001   Health Coping   How would you rate your overall health? Poor      Individualized Plan for Diabetes Self-Management Training:   Learning Objective:  Patient will have a greater understanding of diabetes self-management. Patient education plan is to attend individual and/or group sessions per assessed needs and concerns.  I further educated her on the importance of regular meals to maintain nutrition, metabolism, and for blood sugar control.  Discussed need for a strict low sodium diet and instructed her on 2 gm sodium diet and label reading.  Plan:   Patient Instructions  Speak with your doctor about depression or see a counselor. Consider good sources of Omega-3 daily (salmon,  tuna, walnuts, ground flax seeds).  Talk with your cardiologist prior to supplementation.  Strong evidence that Omega-3 can make antidepressants work better. Try romaine for salads (be sure to add a protein- LS cheese, boiled egg, small portion fresh meat, rinsed tuna) Other greens to try:  Swiss chard, spinach, kale. Make sure to take your vitamin D (2000 units per day per med record). Consider drinking a Boost Glucose Control OR Glucerna instead of skipping breakfast (or yogurt and fruit) Continue to read the labels to avoid sodium. Avoid adding any salt to any foods. Try Mrs. Dash recipes found on line or google other low sodium recipes.  Expected Outcomes:  Demonstrated interest in learning. Expect positive outcomes  Education material provided: Meal plan card  If problems or questions, patient to contact team via:  Phone and Email  Future DSME appointment: 4-6 wks

## 2014-12-30 NOTE — Patient Instructions (Signed)
Speak with your doctor about depression or see a counselor. Consider good sources of Omega-3 daily (salmon, tuna, walnuts, ground flax seeds).  Talk with your cardiologist prior to supplementation.  Strong evidence that Omega-3 can make antidepressants work better. Try romaine for salads (be sure to add a protein- LS cheese, boiled egg, small portion fresh meat, rinsed tuna) Other greens to try:  Swiss chard, spinach, kale. Make sure to take your vitamin D (2000 units per day per med record). Consider drinking a Boost Glucose Control OR Glucerna instead of skipping breakfast (or yogurt and fruit) Continue to read the labels to avoid sodium. Avoid adding any salt to any foods. Try Mrs. Dash recipes found on line or google other low sodium recipes.

## 2015-01-01 ENCOUNTER — Encounter: Payer: Self-pay | Admitting: Rehabilitative and Restorative Service Providers"

## 2015-01-01 ENCOUNTER — Ambulatory Visit: Payer: Medicare Other | Admitting: Rehabilitative and Restorative Service Providers"

## 2015-01-01 DIAGNOSIS — M542 Cervicalgia: Secondary | ICD-10-CM

## 2015-01-01 DIAGNOSIS — R200 Anesthesia of skin: Secondary | ICD-10-CM

## 2015-01-01 DIAGNOSIS — R293 Abnormal posture: Secondary | ICD-10-CM

## 2015-01-01 DIAGNOSIS — M6281 Muscle weakness (generalized): Secondary | ICD-10-CM

## 2015-01-01 NOTE — Therapy (Signed)
[facility] [address] [place], [place], [address] Phone: [phone]   Fax:  [phone]  Physical Therapy Treatment  Patient Details  Name: [name] [name] [name]: [id number] Date of Birth: 1946-01-18 Referring Provider: Lenor Coffin, MD  Encounter Date: 01/01/2015      PT End of Session - 01/01/15 1202    Visit Number 7   Number of Visits 12   Date for PT Re-Evaluation 01/31/15   Authorization Type G code every 10th visit   PT Start Time 1100   PT Stop Time 1144   PT Time Calculation (min) 44 min   Activity Tolerance Patient tolerated treatment well   Behavior During Therapy [facility] for tasks assessed/performed      Past Medical History  Diagnosis Date  . Vomiting     persistent  . Asthma   . Ventral hernia     hx of  . GERD (gastroesophageal reflux disease)   . Obesity 01-2010  . Hyperlipidemia   . Hypertension   . Irregular heart beat   . Coronary artery disease   . Heart murmur   . Anxiety   . PONV (postoperative nausea and vomiting)   . Peripheral vascular disease (HCC)     ? numbness, tingling arms and legs  . Anemia     hx  . H/O hiatal hernia   . Neuromuscular disorder ([place])     ?  Marland Kitchen Dysrhythmia   . Mental disorder     hx of confusion  . CHF (congestive heart failure) ([place])   . Anginal pain ([place])   . Pneumonia 2000's    "once"  . Chronic bronchitis ([place])     "off and on all the time" (07/18/2013)  . Claustrophobia   . Type II diabetes mellitus ([place])   . Chronic lower back pain   . Other and unspecified angina pectoris   . Obstructive sleep apnea     "can't wear machine; I have claustrophobia" (07/18/2013)  . Osteoarthritis     "knees and hands" (07/18/2013)  . Bundle branch block, left     chronic/notes 07/18/2013  . Vomiting blood   . Myocardial infarction ([place])     04/1999, 02/2000, 01/2005; 2011; 2014  . Shortness of breath     with exertion  . Chronic kidney disease      frequency, sses dr Jamal Maes every 4 to 6 months  . Migraine     "used to have them really bad; kind of eased up now; down to 1 q couple months" (07/18/2013)  . Headache     "probably 4 days/wk" (07/18/2013)  . Leg cramps     both legs at times  . Stroke ([place]) 03/22/12    right side brain; denies residual on 07/18/2013)  . Stroke [facility]) Oct. 2015    Affected pt.s balance  . Cancer [facility]) April 2016    BCC,  Melanoma  . Common migraine 05/14/2014  . Abnormality of gait 05/14/2014  . Memory change 05/14/2014    Past Surgical History  Procedure Laterality Date  . Tumor removal  1970's    ovarian  . Tibia fracture surgery Right 1970's    rods and pins  . Gastric bypass  1984    "stapeling"  . Total abdominal hysterectomy  1970's    w/ appendectomy  . Ankle fracture surgery Left 1970's  . Fracture surgery    . Esophagogastroduodenoscopy  08/03/2011    Procedure: ESOPHAGOGASTRODUODENOSCOPY (EGD);  Surgeon:  Shann Medal, MD;  Location: [facility];  Service: General;  Laterality: N/A;  . Gall stone removal  05/2003  . Root canal  10/2000  . Appendectomy  1970's    w/hysterectomy  . Coronary angioplasty with stent placement  01,02,05,06,07,08,11; 04/24/2013    "I've probably got ~ 10 stents by now" (04/24/2013)  . Coronary angioplasty with stent placement  06/13/2013    "got 4 stents today" (06/13/2013)  . Cardiac catheterization  10/10/2012    Dr [name].  . Carotid endarterectomy Left 03/2013  . Cerebral angiogram N/A 04/05/2011    Procedure: CEREBRAL ANGIOGRAM;  Surgeon: Angelia Mould, MD;  Location: [facility] CATH LAB;  Service: Cardiovascular;  Laterality: N/A;  . Carotid stent insertion Left 03/20/2013    Procedure: CAROTID STENT INSERTION;  Surgeon: [name], MD;  Location: [facility] CATH LAB;  Service: Cardiovascular;  Laterality: Left;  internal carotid  . Percutaneous stent intervention N/A 04/24/2013    Procedure: PERCUTANEOUS STENT INTERVENTION;  Surgeon: [name], MD;   Location: [facility] CATH LAB;  Service: Cardiovascular;  Laterality: N/A;  . Percutaneous coronary stent intervention (pci-s) N/A 06/13/2013    Procedure: PERCUTANEOUS CORONARY STENT INTERVENTION (PCI-S);  Surgeon: [name], MD;  Location: [facility] CATH LAB;  Service: Cardiovascular;  Laterality: N/A;  . Coronary artery bypass graft  1220/11    "CABG X5"  . Cholecystectomy  2004  . Hernia repair  2004    "in my stomach; had OR on it twice", wire mesh on 1 hernia  . Esophagogastroduodenoscopy (egd) with propofol N/A 03/11/2014    Procedure: ESOPHAGOGASTRODUODENOSCOPY (EGD) WITH PROPOFOL;  Surgeon: Lafayette Dragon, MD;  Location: [facility] ENDOSCOPY;  Service: Endoscopy;  Laterality: N/A;  . Tooth extraction      There were no vitals filed for this visit.  Visit Diagnosis:  Neck pain  Arm numbness  Postural instability  Generalized muscle weakness      Subjective Assessment - 01/01/15 1136    Subjective Pt. reports moderate dizziness while walking and sitting still.     Patient Stated Goals Address neck pain and arm numbness   Currently in Pain? Yes   Pain Score 4    Pain Location Neck   Pain Orientation Right;Posterior   Pain Descriptors / Indicators Aching;Constant   Pain Type Chronic pain   Pain Radiating Towards pain radiating down boths arms into fingers or both hands.    Pain Onset More than a month ago   Aggravating Factors  overhead activities.   Pain Relieving Factors not stated.   Multiple Pain Sites No     Cervical Traction (to decompress cervical vertebrae to decrease pain level): - manual traction 2 min in 20 degrees cervical flexion; pt. demo'd decreased muscle guarding so continued to mechanical traction.  - mechanical traction - applied using the EMPI home cervical unit for a total of 8 minutes at progressing trials of: 2 min 14lb, 2 min 17lbs, 2 min 20lbs, 2 min 20lbs, 2 min decompresion 0 lbs; pt. reports slight dizziness upon sitting following traction but also  reported a decrease in neck pain from 4/10 > 2/10.   Therex (to increase scapular retraction strength and improve posture awareness): Pt. educated on purpose of scapular retraction exercise and posture training: - sitting scapular retraction 2 x 15 reps with blue TB; verbal cues provided for full ROM; pt. with diminished grip on R UE with tingling and numbness reported during activity.    -Gaze x 1 adaptation exercises in  sitting with cues on maintaining visual fixation on target.  Pt experiences nausea and dizziness with slow, minimal head movement x 5 reps.  PT provided for HEP to emphasize neck ROM with visual activity.  Stretching (to increase cervical/retraction ROM to relieve pain): - cervical lateral bending AAROM 2 x 30 sec each way; verbal cues provided to prevent rotation.  - cervical rotation AAROM R and L x 30 sec each way  - cervical flexion/extension AAROM x 30 sec each way     01/01/15 1041  PT Education  Education provided Yes  Education Details HEP: added gaze x 1 viewing sitting x 5 reps with cues  Person(s) Educated Patient  Methods Explanation;Demonstration;Handout  Comprehension Returned demonstration;Verbalized understanding;Need further instruction;Verbal cues required         PT Short Term Goals - 12/12/14 1254    PT SHORT TERM GOAL #1   Title The patient will be independent with HEP for postural flexibility, neck stretching, and postural stabilization.   Baseline Target date 01/10/2015   Time 4   Period Weeks   PT SHORT TERM GOAL #2   Title The patient will improve neck ROM flexion/extension from 8 degrees to > or equal to 18 degrees.   Baseline Target date 01/10/2015   Time 4   Period Weeks   PT SHORT TERM GOAL #3   Title The aptient will improve R grip from 10 lbs to > or equal to 15 lbs. and L grip from 5 lbs to > or equal to 10 lbs.   Baseline Target date 01/10/2015   Time 4   Period Weeks   PT SHORT TERM GOAL #4   Title The patient will report  pain < or equal to 3/10 at rest.   Baseline Target date 01/10/2015   Time 4   Period Weeks           PT Long Term Goals - 12/12/14 1301    PT LONG TERM GOAL #1   Title The patient will  improve neck disability index by > or equal to 15% (from 54% baseline).   Baseline Target date 01/31/2015   Time 6   Period Weeks   PT LONG TERM GOAL #2   Title The patient will imrpove neck rotation R from 34 deg to 45 deg and L from 22 deg to 40 deg.   Baseline Target date 01/31/2015   Time 6   Period Weeks   PT LONG TERM GOAL #3   Title The patient will improve shoulder A/ROM to > or equal to 140 degrees bilaterally in seated position.   Baseline Target date 01/31/2015   Time 6   Period Weeks   PT LONG TERM GOAL #4   Title The patient will be able to reach overhead to fix hair without reports of arms going numb.   Baseline Target date 01/31/2015   Time 6   Period Weeks               Plan - 01/01/15 1204    Clinical Impression Statement Pt. continues to report decreased pain with use of mechanical traction in session.  Pt. showing improved cervical ROM and less guarding with movement.  Pt. continues to report dizziness with head turns while walking and sitting.  Vestibular rehab HEP issued to pt. today.     Pt will benefit from skilled therapeutic intervention in order to improve on the following deficits Abnormal gait;Decreased activity tolerance;Decreased mobility;Decreased strength;Decreased range of motion;Impaired flexibility;Dizziness;Pain  Rehab Potential Good   PT Frequency 2x / week   PT Duration 6 weeks   PT Treatment/Interventions ADLs/Self Care Home Management;Balance training;Neuromuscular re-education;Therapeutic activities;Functional mobility training;Patient/family education;Therapeutic exercise;Manual techniques;Cryotherapy;Traction;Passive range of motion   PT Next Visit Plan Continue with mechanical traction to reduce and continue toward goals.  Monitor adherence to  vestibular rehab HEP.     Consulted and Agree with Plan of Care Patient        Problem List Patient Active Problem List   Diagnosis Date Noted  . Common migraine 05/14/2014  . Abnormality of gait 05/14/2014  . Memory change 05/14/2014  . Aneurysm, cerebral, nonruptured 05/14/2014  . Cramp of limb-Left neck 05/10/2014  . Hematemesis with nausea   . Vomiting blood   . Dizziness and giddiness 09/21/2013  . Atypical chest pain 07/18/2013  . Unstable angina ([place]) 04/09/2013  . Carotid artery stenosis, symptomatic 03/20/2013  . Chronic diastolic CHF (congestive heart failure) ([place]) 09/23/2012  . Cerebrovascular disease 07/10/2012  . Cerebral artery occlusion with cerebral infarction ([place]) 07/10/2012  . Mitral regurgitation 04/15/2012  . TIA (transient ischemic attack) 04/15/2012  . Occlusion and stenosis of carotid artery without mention of cerebral infarction 08/18/2011  . Bariatric surgery status 06/17/2011  . Speech abnormality 03/22/2011  . Dyspnea 02/24/2011  . PAPILLARY MUSCLE DYSFUNCTION, NON-RHEUMATIC 10/09/2008  . UNSPECIFIED VITAMIN D DEFICIENCY 10/24/2007  . MYOCARDIAL INFARCTION, HX OF 10/24/2007  . PERSISTENT VOMITING 10/24/2007  . OSTEOARTHRITIS 10/24/2007  . MIGRAINES, HX OF 10/24/2007  . DM 01/16/2007  . Hyperlipemia 01/16/2007  . Obesity-post failed open gastroplasty 1984  01/16/2007  . OBSTRUCTIVE SLEEP APNEA 01/16/2007  . Essential hypertension 01/16/2007  . Coronary atherosclerosis 01/16/2007  . ASTHMA 01/16/2007  . GERD 01/16/2007  . VENTRAL HERNIA 01/16/2007    Bess Harvest 01/02/2015, 8:01 AM  Bess Harvest, [place]  Name: [name] [name] [name]: [id number] Date of Birth: 10-Nov-1945   This entire session was performed under direct supervision and direction of a licensed Chiropractor . I have personally read, edited and approve of the note as written. WEAVER,CHRISTINA, PT

## 2015-01-01 NOTE — Patient Instructions (Signed)
Gaze Stabilization: Tip Card  1.Target must remain in focus, not blurry, and appear stationary while head is in motion. 2.Perform exercises with small head movements (45 to either side of midline). 3.Increase speed of head motion so long as target is in focus. 4.If you wear eyeglasses, be sure you can see target through lens (therapist will give specific instructions for bifocal / progressive lenses). 5.These exercises may provoke dizziness or nausea. Work through these symptoms. If too dizzy, slow head movement slightly. Rest between each exercise. 6.Exercises demand concentration; avoid distractions. 7.For safety, perform standing exercises close to a counter, wall, corner, or next to someone.  Copyright  VHI. All rights reserved.  Gaze Stabilization: Sitting    Keeping eyes on target on wall 3 feet away, tilt head down slightly and move head side to side 5 times.Rest and repeat again. Do __2-3__ sessions per day.  Copyright  VHI. All rights reserved.   Start slow with head movement and increase as you can tolerate.

## 2015-01-06 ENCOUNTER — Ambulatory Visit: Payer: Medicare Other | Attending: Neurology | Admitting: Physical Therapy

## 2015-01-06 ENCOUNTER — Encounter: Payer: Self-pay | Admitting: Physical Therapy

## 2015-01-06 DIAGNOSIS — M542 Cervicalgia: Secondary | ICD-10-CM | POA: Insufficient documentation

## 2015-01-06 DIAGNOSIS — M6281 Muscle weakness (generalized): Secondary | ICD-10-CM | POA: Diagnosis present

## 2015-01-06 DIAGNOSIS — R293 Abnormal posture: Secondary | ICD-10-CM | POA: Diagnosis present

## 2015-01-06 DIAGNOSIS — R208 Other disturbances of skin sensation: Secondary | ICD-10-CM | POA: Diagnosis present

## 2015-01-06 DIAGNOSIS — R200 Anesthesia of skin: Secondary | ICD-10-CM

## 2015-01-06 NOTE — Therapy (Signed)
[facility] [address] [place], [place], [address] Phone: [phone]   Fax:  [phone]  Physical Therapy Treatment  Patient Details  Name: SYLVI UDO [name]: [id number] Date of Birth: [birth year] Referring Provider: Lenor Coffin, MD  Encounter Date: 01/06/2015      PT End of Session - 01/06/15 1603    Visit Number 8   Number of Visits 12   Date for PT Re-Evaluation 01/31/15   Authorization Type G code every 10th visit   PT Start Time 1318   PT Stop Time 1400   PT Time Calculation (min) 42 min   Activity Tolerance Patient tolerated treatment well   Behavior During Therapy [facility] for tasks assessed/performed      Past Medical History  Diagnosis Date  . Vomiting     persistent  . Asthma   . Ventral hernia     hx of  . GERD (gastroesophageal reflux disease)   . Obesity 01-2010  . Hyperlipidemia   . Hypertension   . Irregular heart beat   . Coronary artery disease   . Heart murmur   . Anxiety   . PONV (postoperative nausea and vomiting)   . Peripheral vascular disease (HCC)     ? numbness, tingling arms and legs  . Anemia     hx  . H/O hiatal hernia   . Neuromuscular disorder ([place])     ?  Marland Kitchen Dysrhythmia   . Mental disorder     hx of confusion  . CHF (congestive heart failure) ([place])   . Anginal pain ([place])   . Pneumonia 2000's    "once"  . Chronic bronchitis ([place])     "off and on all the time" (07/18/2013)  . Claustrophobia   . Type II diabetes mellitus ([place])   . Chronic lower back pain   . Other and unspecified angina pectoris   . Obstructive sleep apnea     "can't wear machine; I have claustrophobia" (07/18/2013)  . Osteoarthritis     "knees and hands" (07/18/2013)  . Bundle branch block, left     chronic/notes 07/18/2013  . Vomiting blood   . Myocardial infarction ([place])     04/1999, 02/2000, 01/2005; 2011; 2014  . Shortness of breath     with exertion  . Chronic kidney disease      frequency, sses dr Jamal Maes every 4 to 6 months  . Migraine     "used to have them really bad; kind of eased up now; down to 1 q couple months" (07/18/2013)  . Headache     "probably 4 days/wk" (07/18/2013)  . Leg cramps     both legs at times  . Stroke ([place]) 03/22/12    right side brain; denies residual on 07/18/2013)  . Stroke [facility]) Oct. 2015    Affected pt.s balance  . Cancer [facility]) April 2016    BCC,  Melanoma  . Common migraine 05/14/2014  . Abnormality of gait 05/14/2014  . Memory change 05/14/2014    Past Surgical History  Procedure Laterality Date  . Tumor removal  1970's    ovarian  . Tibia fracture surgery Right 1970's    rods and pins  . Gastric bypass  1984    "stapeling"  . Total abdominal hysterectomy  1970's    w/ appendectomy  . Ankle fracture surgery Left 1970's  . Fracture surgery    . Esophagogastroduodenoscopy  08/03/2011    Procedure: ESOPHAGOGASTRODUODENOSCOPY (EGD);  Surgeon:  Shann Medal, MD;  Location: [facility];  Service: General;  Laterality: N/A;  . Gall stone removal  05/2003  . Root canal  10/2000  . Appendectomy  1970's    w/hysterectomy  . Coronary angioplasty with stent placement  01,02,05,06,07,08,11; 04/24/2013    "I've probably got ~ 10 stents by now" (04/24/2013)  . Coronary angioplasty with stent placement  06/13/2013    "got 4 stents today" (06/13/2013)  . Cardiac catheterization  10/10/2012    Dr [name].  . Carotid endarterectomy Left 03/2013  . Cerebral angiogram N/A 04/05/2011    Procedure: CEREBRAL ANGIOGRAM;  Surgeon: Angelia Mould, MD;  Location: [facility] CATH LAB;  Service: Cardiovascular;  Laterality: N/A;  . Carotid stent insertion Left 03/20/2013    Procedure: CAROTID STENT INSERTION;  Surgeon: [name], MD;  [facility] CATH LAB;  Service: Cardiovascular;  Laterality: Left;  internal carotid  . Percutaneous stent intervention N/A 04/24/2013    Procedure: PERCUTANEOUS STENT INTERVENTION;  Surgeon: [name], MD;   Location: [facility] CATH LAB;  Service: Cardiovascular;  Laterality: N/A;  . Percutaneous coronary stent intervention (pci-s) N/A 06/13/2013    Procedure: PERCUTANEOUS CORONARY STENT INTERVENTION (PCI-S);  Surgeon: [name], MD;  Location: [facility] CATH LAB;  Service: Cardiovascular;  Laterality: N/A;  . Coronary artery bypass graft  1220/11    "CABG X5"  . Cholecystectomy  2004  . Hernia repair  2004    "in my stomach; had OR on it twice", wire mesh on 1 hernia  . Esophagogastroduodenoscopy (egd) with propofol N/A 03/11/2014    Procedure: ESOPHAGOGASTRODUODENOSCOPY (EGD) WITH PROPOFOL;  Surgeon: Lafayette Dragon, MD;  Location: [facility];  Service: Endoscopy;  Laterality: N/A;  . Tooth extraction      There were no vitals filed for this visit.  Visit Diagnosis:  Neck pain  Arm numbness  Postural instability  Generalized muscle weakness      Subjective Assessment - 01/06/15 1600    Subjective Pt. continues to report frequent dizziness throughout the day while walking and sitting still with head turns.  pt. reports tingling and numbness in both hands with the left hand being the worst.     Patient Stated Goals Address neck pain and arm numbness   Currently in Pain? Yes   Pain Score 4    Pain Location Neck   Pain Orientation Posterior   Pain Descriptors / Indicators Aching;Constant   Pain Type Chronic pain   Pain Radiating Towards pain radiating down both arms into fingers of both hands.     Pain Onset More than a month ago   Pain Frequency Constant   Aggravating Factors  overhead activities.    Pain Relieving Factors not stated.    Effect of Pain on Daily Activities Not stated.     Multiple Pain Sites No     Cervical Traction (to decompress cervical vertebrae to decrease pain level): - manual traction 2 min in 20 degrees cervical flexion; pt. demo'd decreased muscle guarding so continued to mechanical traction.  - mechanical traction - applied using the EMPI home cervical  unit for a total of 8 minutes at progressing trials of: 2 min 14lb, 2 min 19lbs, 2 min 21lbs, 2 min 24lbs, 2 min decompresion 0 lbs; pt. reports slight dizziness upon sitting following traction but also reported a decrease in neck pain from 4/10 > 2/10.   Stretching (to increase cervical/retraction ROM to relieve pain): - cervical lateral bending AAROM 2 x 30 sec  each way; verbal cues provided to prevent rotation.  - cervical rotation AAROM R and L x 30 sec each way  - cervical flexion/extension AAROM x 30 sec each way       01/07/15 1446  AROM  Overall AROM  Deficits  AROM Assessment Site Cervical  Cervical Flexion 28  Cervical Extension 21  Strength  Overall Strength Deficits  Overall Strength Comments 17 lbs R side, 10 lbs L grip.            PT Short Term Goals - 01/06/15 1610    PT SHORT TERM GOAL #1   Title The patient will be independent with HEP for postural flexibility, neck stretching, and postural stabilization.   Baseline 01/06/2015: Pt. able to verbalize / demo independence with HEP.     Time 4   Period Weeks   Status Achieved   PT SHORT TERM GOAL #2   Title The patient will improve neck ROM flexion/extension from 8 degrees to > or equal to 18 degrees.   Baseline 01/06/2015: Pt. able to improve neck ROM flexion/extension to > 21 degrees.     Time 4   Period Weeks   Status Achieved   PT SHORT TERM GOAL #3   Title The aptient will improve R grip from 10 lbs to > or equal to 15 lbs. and L grip from 5 lbs to > or equal to 10 lbs.   Baseline 01/06/2015: Pt. able to improve R grip to 17 lbs and L grip to 10 lbs.     Time 4   Period Weeks   Status Achieved   PT SHORT TERM GOAL #4   Title The patient will report pain < or equal to 3/10 at rest.   Baseline 01/06/2015: pt. reports pain at 4/10 at rest.     Time 4   Period Weeks   Status On-going           PT Long Term Goals - 12/12/14 1301    PT LONG TERM GOAL #1   Title The patient will  improve neck  disability index by > or equal to 15% (from 54% baseline).   Baseline Target date 01/31/2015   Time 6   Period Weeks   PT LONG TERM GOAL #2   Title The patient will imrpove neck rotation R from 34 deg to 45 deg and L from 22 deg to 40 deg.   Baseline Target date 01/31/2015   Time 6   Period Weeks   PT LONG TERM GOAL #3   Title The patient will improve shoulder A/ROM to > or equal to 140 degrees bilaterally in seated position.   Baseline Target date 01/31/2015   Time 6   Period Weeks   PT LONG TERM GOAL #4   Title The patient will be able to reach overhead to fix hair without reports of arms going numb.   Baseline Target date 01/31/2015   Time 6   Period Weeks           Plan - 01/06/15 1604    Clinical Impression Statement Pt. continues to report pain relief following mechanical traction in session.  Pt. able to achieve STG #1 verbalizing / demo HEP.  Pt. able to achieve STG #2 with improved cervical ROM.  Pt. able to achieve STG #3 with improved grip strength on bilateral UEs.   Pt. pain continues to be reported above STG #4 level.     Pt will benefit from skilled therapeutic intervention  in order to improve on the following deficits Abnormal gait;Decreased activity tolerance;Decreased mobility;Decreased strength;Decreased range of motion;Impaired flexibility;Dizziness;Pain   Rehab Potential Good   PT Frequency 2x / week   PT Duration 6 weeks   PT Treatment/Interventions ADLs/Self Care Home Management;Balance training;Neuromuscular re-education;Therapeutic activities;Functional mobility training;Patient/family education;Therapeutic exercise;Manual techniques;Cryotherapy;Traction;Passive range of motion   PT Next Visit Plan Continue with mechanical traction to reduce and continue toward goals.  Monitor adherence to vestibular rehab HEP.     Consulted and Agree with Plan of Care Patient        Problem List Patient Active Problem List   Diagnosis Date Noted  . Common migraine  05/14/2014  . Abnormality of gait 05/14/2014  . Memory change 05/14/2014  . Aneurysm, cerebral, nonruptured 05/14/2014  . Cramp of limb-Left neck 05/10/2014  . Hematemesis with nausea   . Vomiting blood   . Dizziness and giddiness 09/21/2013  . Atypical chest pain 07/18/2013  . Unstable angina ([place]) 04/09/2013  . Carotid artery stenosis, symptomatic 03/20/2013  . Chronic diastolic CHF (congestive heart failure) ([place]) 09/23/2012  . Cerebrovascular disease 07/10/2012  . Cerebral artery occlusion with cerebral infarction ([place]) 07/10/2012  . Mitral regurgitation 04/15/2012  . TIA (transient ischemic attack) 04/15/2012  . Occlusion and stenosis of carotid artery without mention of cerebral infarction 08/18/2011  . Bariatric surgery status 06/17/2011  . Speech abnormality 03/22/2011  . Dyspnea 02/24/2011  . PAPILLARY MUSCLE DYSFUNCTION, NON-RHEUMATIC 10/09/2008  . UNSPECIFIED VITAMIN D DEFICIENCY 10/24/2007  . MYOCARDIAL INFARCTION, HX OF 10/24/2007  . PERSISTENT VOMITING 10/24/2007  . OSTEOARTHRITIS 10/24/2007  . MIGRAINES, HX OF 10/24/2007  . DM 01/16/2007  . Hyperlipemia 01/16/2007  . Obesity-post failed open gastroplasty 1984  01/16/2007  . OBSTRUCTIVE SLEEP APNEA 01/16/2007  . Essential hypertension 01/16/2007  . Coronary atherosclerosis 01/16/2007  . ASTHMA 01/16/2007  . GERD 01/16/2007  . VENTRAL HERNIA 01/16/2007    Bess Harvest 01/07/2015, 3:06 PM  Bess Harvest, [place]  Name: ASSATA SHOAFF [name]: [id number] Date of Birth: 04-30-1945  This note has been reviewed and edited by supervising CI.  Willow Ora, PTA, [facility] [address], [place], [place] [address] [phone] 01/08/2015, 10:33 AM

## 2015-01-08 ENCOUNTER — Ambulatory Visit: Payer: Medicare Other | Admitting: Rehabilitative and Restorative Service Providers"

## 2015-01-08 ENCOUNTER — Encounter: Payer: Self-pay | Admitting: Rehabilitative and Restorative Service Providers"

## 2015-01-08 DIAGNOSIS — R293 Abnormal posture: Secondary | ICD-10-CM

## 2015-01-08 DIAGNOSIS — M542 Cervicalgia: Secondary | ICD-10-CM | POA: Diagnosis not present

## 2015-01-08 DIAGNOSIS — M6281 Muscle weakness (generalized): Secondary | ICD-10-CM

## 2015-01-08 NOTE — Patient Instructions (Signed)
Pt. Instructed on importance of HEP adherence with emphasis on gaze exercises at home.  Pt. Verbalized understanding of importance.

## 2015-01-08 NOTE — Therapy (Signed)
[facility] [address] [place], [place], [address] Phone: [phone]   Fax:  [phone]  Physical Therapy Treatment  Patient Details  Name: [name] [name] [name]: [id number] Date of Birth: 1945-05-07 Referring Provider: Lenor Coffin, MD  Encounter Date: 01/08/2015      PT End of Session - 01/08/15 1544    Visit Number 9   Number of Visits 12   Date for PT Re-Evaluation 01/31/15   Authorization Type G code every 10th visit   PT Start Time 1102   PT Stop Time 1147   PT Time Calculation (min) 45 min   Activity Tolerance Patient tolerated treatment well   Behavior During Therapy [facility] for tasks assessed/performed      Past Medical History  Diagnosis Date  . Vomiting     persistent  . Asthma   . Ventral hernia     hx of  . GERD (gastroesophageal reflux disease)   . Obesity 01-2010  . Hyperlipidemia   . Hypertension   . Irregular heart beat   . Coronary artery disease   . Heart murmur   . Anxiety   . PONV (postoperative nausea and vomiting)   . Peripheral vascular disease (HCC)     ? numbness, tingling arms and legs  . Anemia     hx  . H/O hiatal hernia   . Neuromuscular disorder ([place])     ?  Marland Kitchen Dysrhythmia   . Mental disorder     hx of confusion  . CHF (congestive heart failure) ([place])   . Anginal pain ([place])   . Pneumonia 2000's    "once"  . Chronic bronchitis ([place])     "off and on all the time" (07/18/2013)  . Claustrophobia   . Type II diabetes mellitus ([place])   . Chronic lower back pain   . Other and unspecified angina pectoris   . Obstructive sleep apnea     "can't wear machine; I have claustrophobia" (07/18/2013)  . Osteoarthritis     "knees and hands" (07/18/2013)  . Bundle branch block, left     chronic/notes 07/18/2013  . Vomiting blood   . Myocardial infarction ([place])     04/1999, 02/2000, 01/2005; 2011; 2014  . Shortness of breath     with exertion  . Chronic kidney disease      frequency, sses dr Jamal Maes every 4 to 6 months  . Migraine     "used to have them really bad; kind of eased up now; down to 1 q couple months" (07/18/2013)  . Headache     "probably 4 days/wk" (07/18/2013)  . Leg cramps     both legs at times  . Stroke ([place]) 03/22/12    right side brain; denies residual on 07/18/2013)  . Stroke [facility]) Oct. 2015    Affected pt.s balance  . Cancer [facility]) April 2016    BCC,  Melanoma  . Common migraine 05/14/2014  . Abnormality of gait 05/14/2014  . Memory change 05/14/2014    Past Surgical History  Procedure Laterality Date  . Tumor removal  1970's    ovarian  . Tibia fracture surgery Right 1970's    rods and pins  . Gastric bypass  1984    "stapeling"  . Total abdominal hysterectomy  1970's    w/ appendectomy  . Ankle fracture surgery Left 1970's  . Fracture surgery    . Esophagogastroduodenoscopy  08/03/2011    Procedure: ESOPHAGOGASTRODUODENOSCOPY (EGD);  Surgeon:  Shann Medal, MD;  Location: [facility];  Service: General;  Laterality: N/A;  . Gall stone removal  05/2003  . Root canal  10/2000  . Appendectomy  1970's    w/hysterectomy  . Coronary angioplasty with stent placement  01,02,05,06,07,08,11; 04/24/2013    "I've probably got ~ 10 stents by now" (04/24/2013)  . Coronary angioplasty with stent placement  06/13/2013    "got 4 stents today" (06/13/2013)  . Cardiac catheterization  10/10/2012    Dr [name].  . Carotid endarterectomy Left 03/2013  . Cerebral angiogram N/A 04/05/2011    Procedure: CEREBRAL ANGIOGRAM;  Surgeon: Angelia Mould, MD;  Location: [facility] CATH LAB;  Service: Cardiovascular;  Laterality: N/A;  . Carotid stent insertion Left 03/20/2013    Procedure: CAROTID STENT INSERTION;  Surgeon: [name], MD;  Location: [facility] CATH LAB;  Service: Cardiovascular;  Laterality: Left;  internal carotid  . Percutaneous stent intervention N/A 04/24/2013    Procedure: PERCUTANEOUS STENT INTERVENTION;  Surgeon: [name], MD;   Location: [facility] CATH LAB;  Service: Cardiovascular;  Laterality: N/A;  . Percutaneous coronary stent intervention (pci-s) N/A 06/13/2013    Procedure: PERCUTANEOUS CORONARY STENT INTERVENTION (PCI-S);  Surgeon: [name], MD;  Location: [facility] CATH LAB;  Service: Cardiovascular;  Laterality: N/A;  . Coronary artery bypass graft  1220/11    "CABG X5"  . Cholecystectomy  2004  . Hernia repair  2004    "in my stomach; had OR on it twice", wire mesh on 1 hernia  . Esophagogastroduodenoscopy (egd) with propofol N/A 03/11/2014    Procedure: ESOPHAGOGASTRODUODENOSCOPY (EGD) WITH PROPOFOL;  Surgeon: Lafayette Dragon, MD;  Location: [facility] ENDOSCOPY;  Service: Endoscopy;  Laterality: N/A;  . Tooth extraction      There were no vitals filed for this visit.  Visit Diagnosis:  Neck pain  Postural instability  Generalized muscle weakness      Subjective Assessment - 01/08/15 1541    Subjective Pt. states neck pain has increased due to overdoing christmas decorating yesterday.  Pt. reports tingling / numbness has decreased in bilateral UE and hands.  Pt. reports partial adherence to gaze HEP exercises at home.              [facility] PT Assessment - 01/07/15 1446    AROM   Overall AROM  Deficits   AROM Assessment Site Cervical   Cervical Flexion 28   Cervical Extension 21   Strength   Overall Strength Deficits   Overall Strength Comments 17 lbs R side, 10 lbs L grip.        Cervical Traction (to decompress cervical vertebrae to decrease pain level): - manual traction 2 min in 20 degrees cervical flexion; pt. demo'd decreased muscle guarding so continued to mechanical traction.  - mechanical traction - in 20 degrees cervical flexion applied using the EMPI home cervical unit for a total of 8 minutes at progressing trials of: 2 min 14lb, 2 min 16lbs, 2 min 20lbs, 2 min 23lbs, 2 min decompresion 0 lbs; pt. reports slight dizziness upon sitting following traction but also reported a decrease in neck  pain from 7/10 > 4/10.   Stretching (to increase cervical/retraction ROM to relieve pain): - cervical lateral bending AAROM 2 x 30 sec each way; verbal cues provided to prevent rotation.  - cervical rotation AAROM R and L x 30 sec each way  - cervical flexion/extension AAROM x 30 sec each way   - Gaze x 1 adaptation exercises  in sitting with cues on maintaining visual fixation on target. Pt experiences nausea and dizziness with slow, minimal head movement x 5 reps. PT instructed pt. on importance of gaze exercise with HEP adherence to emphasize neck ROM with visual activity.          PT Short Term Goals - 01/06/15 1610    PT SHORT TERM GOAL #1   Title The patient will be independent with HEP for postural flexibility, neck stretching, and postural stabilization.   Baseline 01/06/2015: Pt. able to verbalize / demo independence with HEP.     Time 4   Period Weeks   Status Achieved   PT SHORT TERM GOAL #2   Title The patient will improve neck ROM flexion/extension from 8 degrees to > or equal to 18 degrees.   Baseline 01/06/2015: Pt. able to improve neck ROM flexion/extension to > 21 degrees.     Time 4   Period Weeks   Status Achieved   PT SHORT TERM GOAL #3   Title The aptient will improve R grip from 10 lbs to > or equal to 15 lbs. and L grip from 5 lbs to > or equal to 10 lbs.   Baseline 01/06/2015: Pt. able to improve R grip to 17 lbs and L grip to 10 lbs.     Time 4   Period Weeks   Status Achieved   PT SHORT TERM GOAL #4   Title The patient will report pain < or equal to 3/10 at rest.   Baseline 01/06/2015: pt. reports pain at 4/10 at rest.     Time 4   Period Weeks   Status On-going           PT Long Term Goals - 12/12/14 1301    PT LONG TERM GOAL #1   Title The patient will  improve neck disability index by > or equal to 15% (from 54% baseline).   Baseline Target date 01/31/2015   Time 6   Period Weeks   PT LONG TERM GOAL #2   Title The patient will  imrpove neck rotation R from 34 deg to 45 deg and L from 22 deg to 40 deg.   Baseline Target date 01/31/2015   Time 6   Period Weeks   PT LONG TERM GOAL #3   Title The patient will improve shoulder A/ROM to > or equal to 140 degrees bilaterally in seated position.   Baseline Target date 01/31/2015   Time 6   Period Weeks   PT LONG TERM GOAL #4   Title The patient will be able to reach overhead to fix hair without reports of arms going numb.   Baseline Target date 01/31/2015   Time 6   Period Weeks               Plan - 01/08/15 1545    Clinical Impression Statement Pt. continues to progress toward goals reporting a decrease in neck pain following mechanical traction.  Pt. continues to report moderate neck pain but manageable.  Increased neck / back pain in todays session reported following exertion with christmas decorating.  Pt. instructed on importance of gaze exercise with HEP (see pt. instruction).    Pt will benefit from skilled therapeutic intervention in order to improve on the following deficits Abnormal gait;Decreased activity tolerance;Decreased mobility;Decreased strength;Decreased range of motion;Impaired flexibility;Dizziness;Pain   Rehab Potential Good   PT Frequency 2x / week   PT Duration 6 weeks   PT Treatment/Interventions ADLs/Self  Care Home Management;Balance training;Neuromuscular re-education;Therapeutic activities;Functional mobility training;Patient/family education;Therapeutic exercise;Manual techniques;Cryotherapy;Traction;Passive range of motion   PT Next Visit Plan Continue with mechanical traction to reduce pain and continue toward goals.  Monitor adherence to vestibular rehab HEP with gaze exercises.  Address G-code with next visit.    Consulted and Agree with Plan of Care Patient        Problem List Patient Active Problem List   Diagnosis Date Noted  . Common migraine 05/14/2014  . Abnormality of gait 05/14/2014  . Memory change 05/14/2014  .  Aneurysm, cerebral, nonruptured 05/14/2014  . Cramp of limb-Left neck 05/10/2014  . Hematemesis with nausea   . Vomiting blood   . Dizziness and giddiness 09/21/2013  . Atypical chest pain 07/18/2013  . Unstable angina ([place]) 04/09/2013  . Carotid artery stenosis, symptomatic 03/20/2013  . Chronic diastolic CHF (congestive heart failure) ([place]) 09/23/2012  . Cerebrovascular disease 07/10/2012  . Cerebral artery occlusion with cerebral infarction ([place]) 07/10/2012  . Mitral regurgitation 04/15/2012  . TIA (transient ischemic attack) 04/15/2012  . Occlusion and stenosis of carotid artery without mention of cerebral infarction 08/18/2011  . Bariatric surgery status 06/17/2011  . Speech abnormality 03/22/2011  . Dyspnea 02/24/2011  . PAPILLARY MUSCLE DYSFUNCTION, NON-RHEUMATIC 10/09/2008  . UNSPECIFIED VITAMIN D DEFICIENCY 10/24/2007  . MYOCARDIAL INFARCTION, HX OF 10/24/2007  . PERSISTENT VOMITING 10/24/2007  . OSTEOARTHRITIS 10/24/2007  . MIGRAINES, HX OF 10/24/2007  . DM 01/16/2007  . Hyperlipemia 01/16/2007  . Obesity-post failed open gastroplasty 1984  01/16/2007  . OBSTRUCTIVE SLEEP APNEA 01/16/2007  . Essential hypertension 01/16/2007  . Coronary atherosclerosis 01/16/2007  . ASTHMA 01/16/2007  . GERD 01/16/2007  . VENTRAL HERNIA 01/16/2007    Bess Harvest 01/08/2015, 3:55 PM  Bess Harvest, [place]   Name: [name] [name] [name]: [id number] Date of Birth: Nov 04, 1945  This entire session was performed under direct supervision and direction of a licensed Chiropractor . I have personally read, edited and approve of the note as written. WEAVER,CHRISTINA, PT

## 2015-01-13 ENCOUNTER — Ambulatory Visit: Payer: Medicare Other | Admitting: Physical Therapy

## 2015-01-15 ENCOUNTER — Ambulatory Visit: Payer: Medicare Other | Admitting: Rehabilitative and Restorative Service Providers"

## 2015-01-15 DIAGNOSIS — M6281 Muscle weakness (generalized): Secondary | ICD-10-CM

## 2015-01-15 DIAGNOSIS — R293 Abnormal posture: Secondary | ICD-10-CM

## 2015-01-15 DIAGNOSIS — M542 Cervicalgia: Secondary | ICD-10-CM

## 2015-01-15 NOTE — Therapy (Addendum)
[facility] [address] [place], [place], [address] Phone: [phone]   Fax:  [phone]  Physical Therapy Treatment  Patient Details  Name: [name] [name] [name]: [id number] Date of Birth: 01/10/1946 Referring Provider: Lenor Coffin, MD  Encounter Date: 01/15/2015      PT End of Session - 01/15/15 1114    Visit Number 10   Number of Visits 12   Date for PT Re-Evaluation 01/31/15   Authorization Type G code every 10th visit   PT Start Time 1110   PT Stop Time 1150   PT Time Calculation (min) 40 min   Activity Tolerance Patient tolerated treatment well   Behavior During Therapy [facility] for tasks assessed/performed      Past Medical History  Diagnosis Date  . Vomiting     persistent  . Asthma   . Ventral hernia     hx of  . GERD (gastroesophageal reflux disease)   . Obesity 01-2010  . Hyperlipidemia   . Hypertension   . Irregular heart beat   . Coronary artery disease   . Heart murmur   . Anxiety   . PONV (postoperative nausea and vomiting)   . Peripheral vascular disease (HCC)     ? numbness, tingling arms and legs  . Anemia     hx  . H/O hiatal hernia   . Neuromuscular disorder ([place])     ?  Marland Kitchen Dysrhythmia   . Mental disorder     hx of confusion  . CHF (congestive heart failure) ([place])   . Anginal pain ([place])   . Pneumonia 2000's    "once"  . Chronic bronchitis ([place])     "off and on all the time" (07/18/2013)  . Claustrophobia   . Type II diabetes mellitus ([place])   . Chronic lower back pain   . Other and unspecified angina pectoris   . Obstructive sleep apnea     "can't wear machine; I have claustrophobia" (07/18/2013)  . Osteoarthritis     "knees and hands" (07/18/2013)  . Bundle branch block, left     chronic/notes 07/18/2013  . Vomiting blood   . Myocardial infarction ([place])     04/1999, 02/2000, 01/2005; 2011; 2014  . Shortness of breath     with exertion  . Chronic kidney disease    frequency, sses dr Jamal Maes every 4 to 6 months  . Migraine     "used to have them really bad; kind of eased up now; down to 1 q couple months" (07/18/2013)  . Headache     "probably 4 days/wk" (07/18/2013)  . Leg cramps     both legs at times  . Stroke ([place]) 03/22/12    right side brain; denies residual on 07/18/2013)  . Stroke [facility]) Oct. 2015    Affected pt.s balance  . Cancer [facility]) April 2016    BCC,  Melanoma  . Common migraine 05/14/2014  . Abnormality of gait 05/14/2014  . Memory change 05/14/2014    Past Surgical History  Procedure Laterality Date  . Tumor removal  1970's    ovarian  . Tibia fracture surgery Right 1970's    rods and pins  . Gastric bypass  1984    "stapeling"  . Total abdominal hysterectomy  1970's    w/ appendectomy  . Ankle fracture surgery Left 1970's  . Fracture surgery    . Esophagogastroduodenoscopy  08/03/2011    Procedure: ESOPHAGOGASTRODUODENOSCOPY (EGD);  Surgeon: Fenton Malling  [name], MD;  Location: [facility];  Service: General;  Laterality: N/A;  . Gall stone removal  05/2003  . Root canal  10/2000  . Appendectomy  1970's    w/hysterectomy  . Coronary angioplasty with stent placement  01,02,05,06,07,08,11; 04/24/2013    "I've probably got ~ 10 stents by now" (04/24/2013)  . Coronary angioplasty with stent placement  06/13/2013    "got 4 stents today" (06/13/2013)  . Cardiac catheterization  10/10/2012    Dr [name].  . Carotid endarterectomy Left 03/2013  . Cerebral angiogram N/A 04/05/2011    Procedure: CEREBRAL ANGIOGRAM;  Surgeon: Angelia Mould, MD;  Location: [facility] CATH LAB;  Service: Cardiovascular;  Laterality: N/A;  . Carotid stent insertion Left 03/20/2013    Procedure: CAROTID STENT INSERTION;  Surgeon: [name], MD;  Location: [facility] CATH LAB;  Service: Cardiovascular;  Laterality: Left;  internal carotid  . Percutaneous stent intervention N/A 04/24/2013    Procedure: PERCUTANEOUS STENT INTERVENTION;  Surgeon: [name], MD;   Location: [facility] CATH LAB;  Service: Cardiovascular;  Laterality: N/A;  . Percutaneous coronary stent intervention (pci-s) N/A 06/13/2013    Procedure: PERCUTANEOUS CORONARY STENT INTERVENTION (PCI-S);  Surgeon: [name], MD;  Location: [facility] CATH LAB;  Service: Cardiovascular;  Laterality: N/A;  . Coronary artery bypass graft  1220/11    "CABG X5"  . Cholecystectomy  2004  . Hernia repair  2004    "in my stomach; had OR on it twice", wire mesh on 1 hernia  . Esophagogastroduodenoscopy (egd) with propofol N/A 03/11/2014    Procedure: ESOPHAGOGASTRODUODENOSCOPY (EGD) WITH PROPOFOL;  Surgeon: Lafayette Dragon, MD;  Location: [facility] END[facility];  Service: Endoscopy;  Laterality: N/A;  . Tooth extraction      There were no vitals filed for this visit.  Visit Diagnosis:  Postural instability  Generalized muscle weakness  Neck pain      Subjective Assessment - 01/15/15 1111    Subjective The patient reports feeling "off" today with greater dizziness and guarded head posture.  The patient reports hands are still tingling the same but not going numb as often.  She reports she can move her hands and pick them up better.   She has been working at her computer more this week and feels that that may be a reason her pain is greater.     Pertinent History Patient reports strokes noted on imaging   Patient Stated Goals Address neck pain and arm numbness   Currently in Pain? Yes   Pain Score 6    Pain Location Neck   Pain Orientation Posterior   Pain Descriptors / Indicators Aching   Pain Type Chronic pain   Pain Onset More than a month ago   Pain Frequency Constant   Aggravating Factors  overhead activities   Pain Relieving Factors not stated      THERAPEUTIC EXERCISE: Supine chin tucks lifting chin towards chest x 5, then repeating with 35-45 deg (estimated) of neck rotation to the right side and the left side.   ROM 52 deg L and 50 deg R Seated A/ROM neck rotation UE arm movements sitting  reaching with shoulders in 90 deg flexion opening and closing hands and performing wrist flex/ext (increases hand numbness), moved arms to 90 deg abduction and better tolerated in this position without numbness.   NEUROMUSCULAR RE-EDUCATION: Habituation exercises seated with head turns side to side x 5 reps x 2 sets Discussed presence of movement in day to day activities  as stimulus that improves function of the inner ear and tolerance of movement - recommended patient work towards increased mobility.  MANUAL: Gentle manual traction supine P/ROM overpressure into rotation/ sidebending with shoulder depression for stretch of upper trapezius musculature        PT Education - 01/15/15 1157    Education provided Yes   Education Details HEP: chin tucks, shoulder horizontal abduction   Person(s) Educated Patient   Methods Explanation;Demonstration;Handout   Comprehension Returned demonstration;Verbalized understanding          PT Short Term Goals - 01/06/15 1610    PT SHORT TERM GOAL #1   Title The patient will be independent with HEP for postural flexibility, neck stretching, and postural stabilization.   Baseline 01/06/2015: Pt. able to verbalize / demo independence with HEP.     Time 4   Period Weeks   Status Achieved   PT SHORT TERM GOAL #2   Title The patient will improve neck ROM flexion/extension from 8 degrees to > or equal to 18 degrees.   Baseline 01/06/2015: Pt. able to improve neck ROM flexion/extension to > 21 degrees.     Time 4   Period Weeks   Status Achieved   PT SHORT TERM GOAL #3   Title The aptient will improve R grip from 10 lbs to > or equal to 15 lbs. and L grip from 5 lbs to > or equal to 10 lbs.   Baseline 01/06/2015: Pt. able to improve R grip to 17 lbs and L grip to 10 lbs.     Time 4   Period Weeks   Status Achieved   PT SHORT TERM GOAL #4   Title The patient will report pain < or equal to 3/10 at rest.   Baseline 01/06/2015: pt. reports pain at 4/10 at  rest.     Time 4   Period Weeks   Status On-going           PT Long Term Goals - 01/15/15 1219    PT LONG TERM GOAL #1   Title The patient will  improve neck disability index by > or equal to 15% (from 54% baseline).   Baseline Target date 01/31/2015   Time 6   Period Weeks   Status On-going   PT LONG TERM GOAL #2   Title The patient will imrpove neck rotation R from 34 deg to 45 deg and L from 22 deg to 40 deg.   Baseline Patient met with R rotation at 52 deg and L rotation at 50 deg.   Time 6   Period Weeks   Status Achieved   PT LONG TERM GOAL #3   Title The patient will improve shoulder A/ROM to > or equal to 140 degrees bilaterally in seated position.   Baseline Patient has shoulder flexion > or equal to 150 degrees.   Time 6   Period Weeks   Status Achieved   PT LONG TERM GOAL #4   Title The patient will be able to reach overhead to fix hair without reports of arms going numb.   Baseline Target date 01/31/2015   Time 6   Period Weeks   Status On-going               Plan - 01/15/15 1220    Clinical Impression Statement The patient has multiple behavioral reasons that she has guarded movement including:  neck pain, dizziness, and fear of quick movement hurting her head.  PT discussed slowly  increasing tolerance to movement and working through dizziness within tolerance.  Patient met 2 LTGs.  She is making progress and anticipate continuing x 4 more weeks at end of her 12th visit as long as she feels continued benefit subjectively.   Pt will benefit from skilled therapeutic intervention in order to improve on the following deficits Abnormal gait;Decreased activity tolerance;Decreased mobility;Decreased strength;Decreased range of motion;Impaired flexibility;Dizziness;Pain   Rehab Potential Good   PT Frequency 2x / week   PT Duration 6 weeks   PT Treatment/Interventions ADLs/Self Care Home Management;Balance training;Neuromuscular re-education;Therapeutic  activities;Functional mobility training;Patient/family education;Therapeutic exercise;Manual techniques;Cryotherapy;Traction;Passive range of motion   PT Next Visit Plan Manual traction (can use mechanical home unit as needed), gaze x 1, habituation head movements seated.   Consulted and Agree with Plan of Care Patient        Problem List Patient Active Problem List   Diagnosis Date Noted  . Common migraine 05/14/2014  . Abnormality of gait 05/14/2014  . Memory change 05/14/2014  . Aneurysm, cerebral, nonruptured 05/14/2014  . Cramp of limb-Left neck 05/10/2014  . Hematemesis with nausea   . Vomiting blood   . Dizziness and giddiness 09/21/2013  . Atypical chest pain 07/18/2013  . Unstable angina ([place]) 04/09/2013  . Carotid artery stenosis, symptomatic 03/20/2013  . Chronic diastolic CHF (congestive heart failure) ([place]) 09/23/2012  . Cerebrovascular disease 07/10/2012  . Cerebral artery occlusion with cerebral infarction ([place]) 07/10/2012  . Mitral regurgitation 04/15/2012  . TIA (transient ischemic attack) 04/15/2012  . Occlusion and stenosis of carotid artery without mention of cerebral infarction 08/18/2011  . Bariatric surgery status 06/17/2011  . Speech abnormality 03/22/2011  . Dyspnea 02/24/2011  . PAPILLARY MUSCLE DYSFUNCTION, NON-RHEUMATIC 10/09/2008  . UNSPECIFIED VITAMIN D DEFICIENCY 10/24/2007  . MYOCARDIAL INFARCTION, HX OF 10/24/2007  . PERSISTENT VOMITING 10/24/2007  . OSTEOARTHRITIS 10/24/2007  . MIGRAINES, HX OF 10/24/2007  . DM 01/16/2007  . Hyperlipemia 01/16/2007  . Obesity-post failed open gastroplasty 1984  01/16/2007  . OBSTRUCTIVE SLEEP APNEA 01/16/2007  . Essential hypertension 01/16/2007  . Coronary atherosclerosis 01/16/2007  . ASTHMA 01/16/2007  . GERD 01/16/2007  . VENTRAL HERNIA 01/16/2007   Physical Therapy Progress Note  Dates of Reporting Period: 12/11/2014 to 01/15/2015  Objective Reports of Subjective Statement: Pain 4/10 at rest,  decreased episodes of hand numbness/ still continues with hand tingling at times.  Objective Measurements: see goal status for neck A/ROM improvement and shoulder ROMM improvement.  Goal Update: see above   Plan: See above  Reason Skilled Services are Required: continued limitations due to postural tightness, postural weakness, dizziness, pain and general decline in mobility.     [place], PT 01/15/2015, 12:22 PM  [facility] [address] [place], [place], [address] Phone: [phone]   Fax:  [phone]  Name: [name] [name] [name]: [id number] Date of Birth: 07/16/1945

## 2015-01-15 NOTE — Patient Instructions (Signed)
Axial Extension (Chin Tuck)    Pull chin in and lengthen back of neck. Hold _3-5___ seconds while counting out loud. Repeat __5__ times. Do when you are at a stop light (still watching the light) as tolerated.  http://gt2.exer.us/450   Copyright  VHI. All rights reserved.  Horizontal Abduction / Adduction    ON LAND! Straighten both arms in front of body at chest level. Pull arms out from midline. Then bring arms forward and together. Hand Variation: Thumbs up  Arm Variation: Move both arms in same direction.  Copyright  VHI. All rights reserved.

## 2015-01-16 ENCOUNTER — Ambulatory Visit: Payer: Medicare Other | Admitting: Physical Therapy

## 2015-01-16 ENCOUNTER — Encounter: Payer: Self-pay | Admitting: Physical Therapy

## 2015-01-16 DIAGNOSIS — M6281 Muscle weakness (generalized): Secondary | ICD-10-CM

## 2015-01-16 DIAGNOSIS — M542 Cervicalgia: Secondary | ICD-10-CM

## 2015-01-16 NOTE — Patient Instructions (Signed)
Added Mulligan's stretch with towel for cervical rotation stretch  Recommended pt do oculomotor exercise with looking down with slow head turns side to side (looking down doesn't provoke as much vertigo as looking up does); Also recommended pt to time how long she is able to do gaze stabilization exercise in seated position and recommended use of Biofreeze on L upper trap region due To tightness and soreness

## 2015-01-16 NOTE — Therapy (Signed)
[facility] [address] [place], [place], [address] Phone: [phone]   Fax:  [phone]  Physical Therapy Treatment  Patient Details  Name: [name] [name] [name]: [id number] Date of Birth: August 17, 1945 Referring Provider: Lenor Coffin, MD  Encounter Date: 01/16/2015      PT End of Session - 01/16/15 1736    Visit Number 11   Number of Visits 12   Date for PT Re-Evaluation 01/31/15   Authorization Type G code every 10th visit   PT Start Time 1533   PT Stop Time 1615   PT Time Calculation (min) 42 min      Past Medical History  Diagnosis Date  . Vomiting     persistent  . Asthma   . Ventral hernia     hx of  . GERD (gastroesophageal reflux disease)   . Obesity 01-2010  . Hyperlipidemia   . Hypertension   . Irregular heart beat   . Coronary artery disease   . Heart murmur   . Anxiety   . PONV (postoperative nausea and vomiting)   . Peripheral vascular disease (HCC)     ? numbness, tingling arms and legs  . Anemia     hx  . H/O hiatal hernia   . Neuromuscular disorder ([place])     ?  Marland Kitchen Dysrhythmia   . Mental disorder     hx of confusion  . CHF (congestive heart failure) ([place])   . Anginal pain ([place])   . Pneumonia 2000's    "once"  . Chronic bronchitis ([place])     "off and on all the time" (07/18/2013)  . Claustrophobia   . Type II diabetes mellitus ([place])   . Chronic lower back pain   . Other and unspecified angina pectoris   . Obstructive sleep apnea     "can't wear machine; I have claustrophobia" (07/18/2013)  . Osteoarthritis     "knees and hands" (07/18/2013)  . Bundle branch block, left     chronic/notes 07/18/2013  . Vomiting blood   . Myocardial infarction ([place])     04/1999, 02/2000, 01/2005; 2011; 2014  . Shortness of breath     with exertion  . Chronic kidney disease     frequency, sses dr Jamal Maes every 4 to 6 months  . Migraine     "used to have them really bad; kind of  eased up now; down to 1 q couple months" (07/18/2013)  . Headache     "probably 4 days/wk" (07/18/2013)  . Leg cramps     both legs at times  . Stroke ([place]) 03/22/12    right side brain; denies residual on 07/18/2013)  . Stroke [facility]) Oct. 2015    Affected pt.s balance  . Cancer [facility] April 2016    BCC,  Melanoma  . Common migraine 05/14/2014  . Abnormality of gait 05/14/2014  . Memory change 05/14/2014    Past Surgical History  Procedure Laterality Date  . Tumor removal  1970's    ovarian  . Tibia fracture surgery Right 1970's    rods and pins  . Gastric bypass  1984    "stapeling"  . Total abdominal hysterectomy  1970's    w/ appendectomy  . Ankle fracture surgery Left 1970's  . Fracture surgery    . Esophagogastroduodenoscopy  08/03/2011    Procedure: ESOPHAGOGASTRODUODENOSCOPY (EGD);  Surgeon: Shann Medal, MD;  Location: [facility];  Service: General;  Laterality: N/A;  Kennyth Arnold  stone removal  05/2003  . Root canal  10/2000  . Appendectomy  1970's    w/hysterectomy  . Coronary angioplasty with stent placement  01,02,05,06,07,08,11; 04/24/2013    "I've probably got ~ 10 stents by now" (04/24/2013)  . Coronary angioplasty with stent placement  06/13/2013    "got 4 stents today" (06/13/2013)  . Cardiac catheterization  10/10/2012    Dr [name].  . Carotid endarterectomy Left 03/2013  . Cerebral angiogram N/A 04/05/2011    Procedure: CEREBRAL ANGIOGRAM;  Surgeon: Angelia Mould, MD;  Location: [facility] CATH LAB;  Service: Cardiovascular;  Laterality: N/A;  . Carotid stent insertion Left 03/20/2013    Procedure: CAROTID STENT INSERTION;  Surgeon: [name], MD;  Location: [facility] CATH LAB;  Service: Cardiovascular;  Laterality: Left;  internal carotid  . Percutaneous stent intervention N/A 04/24/2013    Procedure: PERCUTANEOUS STENT INTERVENTION;  Surgeon: [name], MD;  Location: [facility] CATH LAB;  Service: Cardiovascular;  Laterality: N/A;  . Percutaneous coronary stent  intervention (pci-s) N/A 06/13/2013    Procedure: PERCUTANEOUS CORONARY STENT INTERVENTION (PCI-S);  Surgeon: [name], MD;  Location: [facility] CATH LAB;  Service: Cardiovascular;  Laterality: N/A;  . Coronary artery bypass graft  1220/11    "CABG X5"  . Cholecystectomy  2004  . Hernia repair  2004    "in my stomach; had OR on it twice", wire mesh on 1 hernia  . Esophagogastroduodenoscopy (egd) with propofol N/A 03/11/2014    Procedure: ESOPHAGOGASTRODUODENOSCOPY (EGD) WITH PROPOFOL;  Surgeon: Lafayette Dragon, MD;  Location: [facility] ENDOSCOPY;  Service: Endoscopy;  Laterality: N/A;  . Tooth extraction      There were no vitals filed for this visit.  Visit Diagnosis:  Neck pain  Generalized muscle weakness      Subjective Assessment - 01/16/15 1713    Subjective Pt reports soreness from activities done yesterday in PT; states that the L side of her neck is sore today - says that this is not new but is a little more sore today than it has been   Pertinent History Patient reports strokes noted on imaging   Limitations House hold activities;Walking   Patient Stated Goals Address neck pain and arm numbness   Currently in Pain? Yes   Pain Score 6    Pain Location Neck   Pain Orientation Posterior   Pain Descriptors / Indicators Aching;Sore;Tightness   Pain Type Chronic pain   Pain Onset More than a month ago   Pain Frequency Constant      Pt rates dizziness 5-6/10; pt reported some nausea occurred with activities requiring head turns up/down and side to side  Pt performed standing exercises - marching - R knee instability occurred and pt sat down suddenly on mat  Habituation exercises - seated position - pt performed head turns 5 times up/down and side/side with incr. c/o vertigo  Used green noodle for stretching - behind back - lifting up for shoulder extension stretch 3 reps 10 sec hold Then holding noodle in front - moving side to side for shoulder flexion and trunk rotation and  for improved oculomotor for gaze stabilization - Pt states that vertigo is not as intense with holding noodle down and looking down more than looking up  Pt performed alternate stepping - 3 reps each side - with head turns with min assist                    [facility] Adult PT Treatment/Exercise -  01/16/15 1545    Ambulation/Gait   Ambulation/Gait Yes   Ambulation/Gait Assistance 4: Min guard   Ambulation Distance (Feet) 120 Feet  pt rested approx. every 35'-40' (standing rest break)   Assistive device None   Gait Pattern Decreased step length - right;Decreased step length - left   Ambulation Surface Level;Indoor   Neck Exercises: Stretches   Upper Trapezius Stretch 3 reps;20 seconds   Corner Stretch 3 reps;30 seconds  corner pectoral stretch and Y stretch with contract/relax   Other Neck Stretches Mulligan's stretch for R and L rotation with use of towel - 3 reps at 30 sec hold                PT Education - 01/16/15 1735    Education provided Yes   Education Details see pt instructions   Person(s) Educated Patient   Methods Explanation;Demonstration   Comprehension Verbalized understanding;Returned demonstration  for [employer] stretch          PT Short Term Goals - 01/06/15 1610    PT SHORT TERM GOAL #1   Title The patient will be independent with HEP for postural flexibility, neck stretching, and postural stabilization.   Baseline 01/06/2015: Pt. able to verbalize / demo independence with HEP.     Time 4   Period Weeks   Status Achieved   PT SHORT TERM GOAL #2   Title The patient will improve neck ROM flexion/extension from 8 degrees to > or equal to 18 degrees.   Baseline 01/06/2015: Pt. able to improve neck ROM flexion/extension to > 21 degrees.     Time 4   Period Weeks   Status Achieved   PT SHORT TERM GOAL #3   Title The aptient will improve R grip from 10 lbs to > or equal to 15 lbs. and L grip from 5 lbs to > or equal to 10 lbs.   Baseline  01/06/2015: Pt. able to improve R grip to 17 lbs and L grip to 10 lbs.     Time 4   Period Weeks   Status Achieved   PT SHORT TERM GOAL #4   Title The patient will report pain < or equal to 3/10 at rest.   Baseline 01/06/2015: pt. reports pain at 4/10 at rest.     Time 4   Period Weeks   Status On-going           PT Long Term Goals - 01/15/15 1219    PT LONG TERM GOAL #1   Title The patient will  improve neck disability index by > or equal to 15% (from 54% baseline).   Baseline Target date 01/31/2015   Time 6   Period Weeks   Status On-going   PT LONG TERM GOAL #2   Title The patient will imrpove neck rotation R from 34 deg to 45 deg and L from 22 deg to 40 deg.   Baseline Patient met with R rotation at 52 deg and L rotation at 50 deg.   Time 6   Period Weeks   Status Achieved   PT LONG TERM GOAL #3   Title The patient will improve shoulder A/ROM to > or equal to 140 degrees bilaterally in seated position.   Baseline Patient has shoulder flexion > or equal to 150 degrees.   Time 6   Period Weeks   Status Achieved   PT LONG TERM GOAL #4   Title The patient will be able to reach overhead to fix  hair without reports of arms going numb.   Baseline Target date 01/31/2015   Time 6   Period Weeks   Status On-going               Plan - 01/16/15 1736    Clinical Impression Statement Pt. has significant tightness in L upper trap - recommended use of BioFreeze which she says she has at home and uses for her knees; Pt reported less cervical tightness and discomfort after doing stretches; pt demonstrates significant decr. endurance as evidenced by inability to amb. around track without requiring rest periods.   Pt will benefit from skilled therapeutic intervention in order to improve on the following deficits Abnormal gait;Decreased activity tolerance;Decreased mobility;Decreased strength;Decreased range of motion;Impaired flexibility;Dizziness;Pain   Rehab Potential Good   PT  Frequency 2x / week   PT Duration 6 weeks   PT Treatment/Interventions ADLs/Self Care Home Management;Balance training;Neuromuscular re-education;Therapeutic activities;Functional mobility training;Patient/family education;Therapeutic exercise;Manual techniques;Cryotherapy;Traction;Passive range of motion   PT Next Visit Plan cervical stretches, cont with balance and gait training and habituation   Consulted and Agree with Plan of Care Patient          G-Codes - 19-Jan-2015 1935    Functional Assessment Tool Used neck disabiity index=54% at eval, ROM improved to 50deg L and 52 deg R   Functional Limitation Self care   Self Care Current Status (D4287) At least 20 percent but less than 40 percent impaired, limited or restricted   Self Care Goal Status (G8115) At least 20 percent but less than 40 percent impaired, limited or restricted      Problem List Patient Active Problem List   Diagnosis Date Noted  . Common migraine 05/14/2014  . Abnormality of gait 05/14/2014  . Memory change 05/14/2014  . Aneurysm, cerebral, nonruptured 05/14/2014  . Cramp of limb-Left neck 05/10/2014  . Hematemesis with nausea   . Vomiting blood   . Dizziness and giddiness 09/21/2013  . Atypical chest pain 07/18/2013  . Unstable angina ([place]) 04/09/2013  . Carotid artery stenosis, symptomatic 03/20/2013  . Chronic diastolic CHF (congestive heart failure) ([place]) 09/23/2012  . Cerebrovascular disease 07/10/2012  . Cerebral artery occlusion with cerebral infarction ([place]) 07/10/2012  . Mitral regurgitation 04/15/2012  . TIA (transient ischemic attack) 04/15/2012  . Occlusion and stenosis of carotid artery without mention of cerebral infarction 08/18/2011  . Bariatric surgery status 06/17/2011  . Speech abnormality 03/22/2011  . Dyspnea 02/24/2011  . PAPILLARY MUSCLE DYSFUNCTION, NON-RHEUMATIC 10/09/2008  . UNSPECIFIED VITAMIN D DEFICIENCY 10/24/2007  . MYOCARDIAL INFARCTION, HX OF 10/24/2007  . PERSISTENT  VOMITING 10/24/2007  . OSTEOARTHRITIS 10/24/2007  . MIGRAINES, HX OF 10/24/2007  . DM 01/16/2007  . Hyperlipemia 01/16/2007  . Obesity-post failed open gastroplasty 1984  01/16/2007  . OBSTRUCTIVE SLEEP APNEA 01/16/2007  . Essential hypertension 01/16/2007  . Coronary atherosclerosis 01/16/2007  . ASTHMA 01/16/2007  . GERD 01/16/2007  . VENTRAL HERNIA 01/16/2007    Alda Lea, PT 01/16/2015, 5:45 PM  [facility] [address] [place], [place], [address] Phone: [phone]   Fax:  [phone]  Name: [name] [name] [name]: [id number] Date of Birth: 1945/09/15

## 2015-01-20 ENCOUNTER — Other Ambulatory Visit: Payer: Self-pay | Admitting: Cardiology

## 2015-01-21 ENCOUNTER — Ambulatory Visit: Payer: Medicare Other | Admitting: Physical Therapy

## 2015-01-21 ENCOUNTER — Encounter: Payer: Self-pay | Admitting: Physical Therapy

## 2015-01-21 DIAGNOSIS — R293 Abnormal posture: Secondary | ICD-10-CM

## 2015-01-21 DIAGNOSIS — M6281 Muscle weakness (generalized): Secondary | ICD-10-CM

## 2015-01-21 DIAGNOSIS — R200 Anesthesia of skin: Secondary | ICD-10-CM

## 2015-01-21 DIAGNOSIS — M542 Cervicalgia: Secondary | ICD-10-CM | POA: Diagnosis not present

## 2015-01-21 NOTE — Therapy (Signed)
[facility] [address] [place], [place], [address] Phone: [phone]   Fax:  [phone]  Physical Therapy Treatment  Patient Details  Name: [name] [name] [name]: [id number] Date of Birth: Nov 17, 1945 Referring Provider: Lenor Coffin, MD  Encounter Date: 01/21/2015   01/21/15 1109  PT Visits / Re-Eval  Visit Number 12  Number of Visits 12  Date for PT Re-Evaluation 01/31/15  Authorization  Authorization Type G code every 10th visit  PT Time Calculation  PT Start Time 1102  PT Stop Time 1143  PT Time Calculation (min) 41 min     Past Medical History  Diagnosis Date  . Vomiting     persistent  . Asthma   . Ventral hernia     hx of  . GERD (gastroesophageal reflux disease)   . Obesity 01-2010  . Hyperlipidemia   . Hypertension   . Irregular heart beat   . Coronary artery disease   . Heart murmur   . Anxiety   . PONV (postoperative nausea and vomiting)   . Peripheral vascular disease (HCC)     ? numbness, tingling arms and legs  . Anemia     hx  . H/O hiatal hernia   . Neuromuscular disorder ([place])     ?  Marland Kitchen Dysrhythmia   . Mental disorder     hx of confusion  . CHF (congestive heart failure) ([place])   . Anginal pain ([place])   . Pneumonia 2000's    "once"  . Chronic bronchitis ([place])     "off and on all the time" (07/18/2013)  . Claustrophobia   . Type II diabetes mellitus ([place])   . Chronic lower back pain   . Other and unspecified angina pectoris   . Obstructive sleep apnea     "can't wear machine; I have claustrophobia" (07/18/2013)  . Osteoarthritis     "knees and hands" (07/18/2013)  . Bundle branch block, left     chronic/notes 07/18/2013  . Vomiting blood   . Myocardial infarction ([place])     04/1999, 02/2000, 01/2005; 2011; 2014  . Shortness of breath     with exertion  . Chronic kidney disease     frequency, sses dr Jamal Maes every 4 to 6 months  . Migraine     "used to have  them really bad; kind of eased up now; down to 1 q couple months" (07/18/2013)  . Headache     "probably 4 days/wk" (07/18/2013)  . Leg cramps     both legs at times  . Stroke ([place]) 03/22/12    right side brain; denies residual on 07/18/2013)  . Stroke [facility]) Oct. 2015    Affected pt.s balance  . Cancer [facility]) April 2016    BCC,  Melanoma  . Common migraine 05/14/2014  . Abnormality of gait 05/14/2014  . Memory change 05/14/2014    Past Surgical History  Procedure Laterality Date  . Tumor removal  1970's    ovarian  . Tibia fracture surgery Right 1970's    rods and pins  . Gastric bypass  1984    "stapeling"  . Total abdominal hysterectomy  1970's    w/ appendectomy  . Ankle fracture surgery Left 1970's  . Fracture surgery    . Esophagogastroduodenoscopy  08/03/2011    Procedure: ESOPHAGOGASTRODUODENOSCOPY (EGD);  Surgeon: Shann Medal, MD;  Location: [facility];  Service: General;  Laterality: N/A;  . Gall stone removal  05/2003  .  Root canal  10/2000  . Appendectomy  1970's    w/hysterectomy  . Coronary angioplasty with stent placement  01,02,05,06,07,08,11; 04/24/2013    "I've probably got ~ 10 stents by now" (04/24/2013)  . Coronary angioplasty with stent placement  06/13/2013    "got 4 stents today" (06/13/2013)  . Cardiac catheterization  10/10/2012    Dr [name].  . Carotid endarterectomy Left 03/2013  . Cerebral angiogram N/A 04/05/2011    Procedure: CEREBRAL ANGIOGRAM;  Surgeon: Angelia Mould, MD;  Location: [facility] CATH LAB;  Service: Cardiovascular;  Laterality: N/A;  . Carotid stent insertion Left 03/20/2013    Procedure: CAROTID STENT INSERTION;  Surgeon: [name], MD;  [facility] LAB;  Service: Cardiovascular;  Laterality: Left;  internal carotid  . Percutaneous stent intervention N/A 04/24/2013    Procedure: PERCUTANEOUS STENT INTERVENTION;  Surgeon: [name], MD;  Location: [facility] CATH LAB;  Service: Cardiovascular;  Laterality: N/A;  . Percutaneous  coronary stent intervention (pci-s) N/A 06/13/2013    Procedure: PERCUTANEOUS CORONARY STENT INTERVENTION (PCI-S);  Surgeon: [name], MD;  Location: [facility] CATH LAB;  Service: Cardiovascular;  Laterality: N/A;  . Coronary artery bypass graft  1220/11    "CABG X5"  . Cholecystectomy  2004  . Hernia repair  2004    "in my stomach; had OR on it twice", wire mesh on 1 hernia  . Esophagogastroduodenoscopy (egd) with propofol N/A 03/11/2014    Procedure: ESOPHAGOGASTRODUODENOSCOPY (EGD) WITH PROPOFOL;  Surgeon: Lafayette Dragon, MD;  Location: [facility] ENDOSCOPY;  Service: Endoscopy;  Laterality: N/A;  . Tooth extraction      There were no vitals filed for this visit.  Visit Diagnosis:   Neck pain    Generalized muscle weakness     Postural instability     Arm numbness        Subjective Assessment - 01/21/15 1107    Subjective Having a "bad day" for dizziness. Reports she had been very busy this am and now she feels as if she is spinning. Sitll with neck pain and across her shoulder blades. No falls. Did use the biofreeze on her neck and shoulders and reports it did help alot. Also reports decreased episodes of numbness and tingling.                               Pertinent History Patient reports strokes noted on imaging   Limitations House hold activities;Walking   Patient Stated Goals Address neck pain and arm numbness   Currently in Pain? Yes   Pain Score 5    Pain Location Neck   Pain Orientation Posterior   Pain Descriptors / Indicators Aching;Sore;Tightness   Pain Type Chronic pain   Pain Onset More than a month ago   Pain Frequency Constant   Aggravating Factors  increased movement, overhead reaching activities   Pain Relieving Factors unsure     Manual therapy: Passive stretching to bil upper traps, rhomboids and scalenes.  Stretching to chest wall via bil shoulder depression and retraction  To bil upper traps, rhomboids and subscapular muscles: - soft tissue mobs -  myofascial release - trigger point release - positional release  Seated edge of mat: - muscle energy techniques for cervical/thoracic vertebrae positioning for decreased pain. (from C5-T2)  Gentle cervical distraction, progressing to gentle cervical distraction with rotation left<>right  Exercises: Seated with red thera-band: - rows x 10 reps - shoulder extension  x 10 reps  At wall with red pball - rolling up/down wall with emphasis on neutral cervical position x 5 reps. Pt with slow, cautious motions, increased time needed  Seated edge of mat: - posterior shoulder rolls x 10 reps - scapular retraction, 5 sec x 10 reps         PT Short Term Goals - 01/06/15 1610    PT SHORT TERM GOAL #1   Title The patient will be independent with HEP for postural flexibility, neck stretching, and postural stabilization.   Baseline 01/06/2015: Pt. able to verbalize / demo independence with HEP.     Time 4   Period Weeks   Status Achieved   PT SHORT TERM GOAL #2   Title The patient will improve neck ROM flexion/extension from 8 degrees to > or equal to 18 degrees.   Baseline 01/06/2015: Pt. able to improve neck ROM flexion/extension to > 21 degrees.     Time 4   Period Weeks   Status Achieved   PT SHORT TERM GOAL #3   Title The aptient will improve R grip from 10 lbs to > or equal to 15 lbs. and L grip from 5 lbs to > or equal to 10 lbs.   Baseline 01/06/2015: Pt. able to improve R grip to 17 lbs and L grip to 10 lbs.     Time 4   Period Weeks   Status Achieved   PT SHORT TERM GOAL #4   Title The patient will report pain < or equal to 3/10 at rest.   Baseline 01/06/2015: pt. reports pain at 4/10 at rest.     Time 4   Period Weeks   Status On-going           PT Long Term Goals - 01/15/15 1219    PT LONG TERM GOAL #1   Title The patient will  improve neck disability index by > or equal to 15% (from 54% baseline).   Baseline Target date 01/31/2015   Time 6   Period Weeks    Status On-going   PT LONG TERM GOAL #2   Title The patient will imrpove neck rotation R from 34 deg to 45 deg and L from 22 deg to 40 deg.   Baseline Patient met with R rotation at 52 deg and L rotation at 50 deg.   Time 6   Period Weeks   Status Achieved   PT LONG TERM GOAL #3   Title The patient will improve shoulder A/ROM to > or equal to 140 degrees bilaterally in seated position.   Baseline Patient has shoulder flexion > or equal to 150 degrees.   Time 6   Period Weeks   Status Achieved   PT LONG TERM GOAL #4   Title The patient will be able to reach overhead to fix hair without reports of arms going numb.   Baseline Target date 01/31/2015   Time 6   Period Weeks   Status On-going        01/21/15 1109  Plan  Clinical Impression Statement Pt continues to have guarded posture and increased muscle tightness palpable with manual therapy. Pt did report decreased muscle tightness and pain after session. Pt is making steady progress toward goals.  Pt will benefit from skilled therapeutic intervention in order to improve on the following deficits Abnormal gait;Decreased activity tolerance;Decreased mobility;Decreased strength;Decreased range of motion;Impaired flexibility;Dizziness;Pain  Rehab Potential Good  PT Frequency 2x / week  PT Duration 6  weeks  PT Treatment/Interventions ADLs/Self Care Home Management;Balance training;Neuromuscular re-education;Therapeutic activities;Functional mobility training;Patient/family education;Therapeutic exercise;Manual techniques;Cryotherapy;Traction;Passive range of motion  PT Next Visit Plan cervical stretches, cont with balance and gait training and habituation  Consulted and Agree with Plan of Care Patient      Problem List Patient Active Problem List   Diagnosis Date Noted  . Common migraine 05/14/2014  . Abnormality of gait 05/14/2014  . Memory change 05/14/2014  . Aneurysm, cerebral, nonruptured 05/14/2014  . Cramp of limb-Left neck  05/10/2014  . Hematemesis with nausea   . Vomiting blood   . Dizziness and giddiness 09/21/2013  . Atypical chest pain 07/18/2013  . Unstable angina ([place]) 04/09/2013  . Carotid artery stenosis, symptomatic 03/20/2013  . Chronic diastolic CHF (congestive heart failure) ([place]) 09/23/2012  . Cerebrovascular disease 07/10/2012  . Cerebral artery occlusion with cerebral infarction ([place]) 07/10/2012  . Mitral regurgitation 04/15/2012  . TIA (transient ischemic attack) 04/15/2012  . Occlusion and stenosis of carotid artery without mention of cerebral infarction 08/18/2011  . Bariatric surgery status 06/17/2011  . Speech abnormality 03/22/2011  . Dyspnea 02/24/2011  . PAPILLARY MUSCLE DYSFUNCTION, NON-RHEUMATIC 10/09/2008  . UNSPECIFIED VITAMIN D DEFICIENCY 10/24/2007  . MYOCARDIAL INFARCTION, HX OF 10/24/2007  . PERSISTENT VOMITING 10/24/2007  . OSTEOARTHRITIS 10/24/2007  . MIGRAINES, HX OF 10/24/2007  . DM 01/16/2007  . Hyperlipemia 01/16/2007  . Obesity-post failed open gastroplasty 1984  01/16/2007  . OBSTRUCTIVE SLEEP APNEA 01/16/2007  . Essential hypertension 01/16/2007  . Coronary atherosclerosis 01/16/2007  . ASTHMA 01/16/2007  . GERD 01/16/2007  . VENTRAL HERNIA 01/16/2007    Willow Ora 01/21/2015, 11:12 AM  Willow Ora, PTA, [facility] [address], [place], [place] [address] [phone] 01/22/2015, 11:54 PM   Name: [name] [name] [name]: [id number] Date of Birth: 08/11/1945

## 2015-01-23 ENCOUNTER — Ambulatory Visit: Payer: Medicare Other | Admitting: Physical Therapy

## 2015-01-23 ENCOUNTER — Encounter: Payer: Self-pay | Admitting: Physical Therapy

## 2015-01-23 DIAGNOSIS — R293 Abnormal posture: Secondary | ICD-10-CM

## 2015-01-23 DIAGNOSIS — M542 Cervicalgia: Secondary | ICD-10-CM

## 2015-01-23 DIAGNOSIS — M6281 Muscle weakness (generalized): Secondary | ICD-10-CM

## 2015-01-23 NOTE — Therapy (Signed)
[facility] [address] [place], [place], [address] Phone: [phone]   Fax:  [phone]  Physical Therapy Treatment  Patient Details  Name: [name] [name] [name]: [id number] Date of Birth: 1945/10/06 Referring Provider: Lenor Coffin, MD  Encounter Date: 01/23/2015      PT End of Session - 01/23/15 1108    Visit Number 13   Number of Visits 12   Date for PT Re-Evaluation 01/31/15   Authorization Type G code every 10th visit   PT Start Time 1105   PT Stop Time 1145   PT Time Calculation (min) 40 min      Past Medical History  Diagnosis Date  . Vomiting     persistent  . Asthma   . Ventral hernia     hx of  . GERD (gastroesophageal reflux disease)   . Obesity 01-2010  . Hyperlipidemia   . Hypertension   . Irregular heart beat   . Coronary artery disease   . Heart murmur   . Anxiety   . PONV (postoperative nausea and vomiting)   . Peripheral vascular disease (HCC)     ? numbness, tingling arms and legs  . Anemia     hx  . H/O hiatal hernia   . Neuromuscular disorder ([place])     ?  Marland Kitchen Dysrhythmia   . Mental disorder     hx of confusion  . CHF (congestive heart failure) ([place])   . Anginal pain ([place])   . Pneumonia 2000's    "once"  . Chronic bronchitis ([place])     "off and on all the time" (07/18/2013)  . Claustrophobia   . Type II diabetes mellitus ([place])   . Chronic lower back pain   . Other and unspecified angina pectoris   . Obstructive sleep apnea     "can't wear machine; I have claustrophobia" (07/18/2013)  . Osteoarthritis     "knees and hands" (07/18/2013)  . Bundle branch block, left     chronic/notes 07/18/2013  . Vomiting blood   . Myocardial infarction ([place])     04/1999, 02/2000, 01/2005; 2011; 2014  . Shortness of breath     with exertion  . Chronic kidney disease     frequency, sses dr Jamal Maes every 4 to 6 months  . Migraine     "used to have them really bad; kind of  eased up now; down to 1 q couple months" (07/18/2013)  . Headache     "probably 4 days/wk" (07/18/2013)  . Leg cramps     both legs at times  . Stroke ([place]) 03/22/12    right side brain; denies residual on 07/18/2013)  . Stroke [facility]) Oct. 2015    Affected pt.s balance  . Cancer [facility]) April 2016    BCC,  Melanoma  . Common migraine 05/14/2014  . Abnormality of gait 05/14/2014  . Memory change 05/14/2014    Past Surgical History  Procedure Laterality Date  . Tumor removal  1970's    ovarian  . Tibia fracture surgery Right 1970's    rods and pins  . Gastric bypass  1984    "stapeling"  . Total abdominal hysterectomy  1970's    w/ appendectomy  . Ankle fracture surgery Left 1970's  . Fracture surgery    . Esophagogastroduodenoscopy  08/03/2011    Procedure: ESOPHAGOGASTRODUODENOSCOPY (EGD);  Surgeon: Shann Medal, MD;  Location: [facility];  Service: General;  Laterality: N/A;  Kennyth Arnold  stone removal  05/2003  . Root canal  10/2000  . Appendectomy  1970's    w/hysterectomy  . Coronary angioplasty with stent placement  01,02,05,06,07,08,11; 04/24/2013    "I've probably got ~ 10 stents by now" (04/24/2013)  . Coronary angioplasty with stent placement  06/13/2013    "got 4 stents today" (06/13/2013)  . Cardiac catheterization  10/10/2012    Dr [name].  . Carotid endarterectomy Left 03/2013  . Cerebral angiogram N/A 04/05/2011    Procedure: CEREBRAL ANGIOGRAM;  Surgeon: Angelia Mould, MD;  Location: [facility] CATH LAB;  Service: Cardiovascular;  Laterality: N/A;  . Carotid stent insertion Left 03/20/2013    Procedure: CAROTID STENT INSERTION;  Surgeon: [name], MD;  Location: [facility] CATH LAB;  Service: Cardiovascular;  Laterality: Left;  internal carotid  . Percutaneous stent intervention N/A 04/24/2013    Procedure: PERCUTANEOUS STENT INTERVENTION;  Surgeon: [name], MD;  Location: [facility] CATH LAB;  Service: Cardiovascular;  Laterality: N/A;  . Percutaneous coronary stent  intervention (pci-s) N/A 06/13/2013    Procedure: PERCUTANEOUS CORONARY STENT INTERVENTION (PCI-S);  Surgeon: [name], MD;  Location: [facility] C[facility] LAB;  Service: Cardiovascular;  Laterality: N/A;  . Coronary artery bypass graft  1220/11    "CABG X5"  . Cholecystectomy  2004  . Hernia repair  2004    "in my stomach; had OR on it twice", wire mesh on 1 hernia  . Esophagogastroduodenoscopy (egd) with propofol N/A 03/11/2014    Procedure: ESOPHAGOGASTRODUODENOSCOPY (EGD) WITH PROPOFOL;  Surgeon: Lafayette Dragon, MD;  Location: [facility];  Service: Endoscopy;  Laterality: N/A;  . Tooth extraction      There were no vitals filed for this visit.  Visit Diagnosis:  Neck pain  Generalized muscle weakness  Postural instability      Subjective Assessment - 01/23/15 1105    Subjective Having a bad day, twisted her back yesterday reaching into fridge and now has pain running down her back into her legs, along with across her back and shoulders. No falls. Used biofreeeze and it helped a little.    Currently in Pain? Yes   Pain Score 6    Pain Location Generalized  shoulder, back into bil legs   Pain Orientation Right;Left;Posterior;Lower   Pain Descriptors / Indicators Aching;Sore;Radiating;Tightness   Pain Type Chronic pain   Pain Radiating Towards radiating down both arms and both legs today   Pain Onset More than a month ago   Pain Frequency Constant   Aggravating Factors  increased movement, overhead reaching, poor body mechanics   Pain Relieving Factors biofreeze, stretching     Treatment: Manual therapy Soft tissue mobs, myofascial release, trigger point release and gentle cervical distraction. Worked on passive stretching of upper traps and scalenes as well.  Neuro re-ed Supine head left<>right, then up<>down x 10 reps each, 2 sets. Rest between sets to allow dizziness to resolve Seated: head movements left<>right and up<>down, 10 reps each way, 3 sets. Rest between sets to  allow dizziness to resolve. Hands together at shoulder height with arms extended out: alternating shoulder horizontal abduction out and back in with pt tracking her hand (eyes remained on hand with head movements) x 10 each way. Increased dizziness, allowed it to resolve before moving onto next activity. Hands together in lap: alternating diagonal arm lifts up<>down, once again pt tracking her hand with eyes focused on hand during head movements x 10 each way. Increased dizziness, allowed to resolve before next activity. Bending  over and back up to pick up/put down 4 cones to/from floor x 4 reps each way. Rest between reps to allow dizziness to resolve. Moving cones to/from bil sides of pt with upper trunk rotation and head movements, 4 reps with rest between to allow dizziness to resolve.          PT Short Term Goals - 01/06/15 1610    PT SHORT TERM GOAL #1   Title The patient will be independent with HEP for postural flexibility, neck stretching, and postural stabilization.   Baseline 01/06/2015: Pt. able to verbalize / demo independence with HEP.     Time 4   Period Weeks   Status Achieved   PT SHORT TERM GOAL #2   Title The patient will improve neck ROM flexion/extension from 8 degrees to > or equal to 18 degrees.   Baseline 01/06/2015: Pt. able to improve neck ROM flexion/extension to > 21 degrees.     Time 4   Period Weeks   Status Achieved   PT SHORT TERM GOAL #3   Title The aptient will improve R grip from 10 lbs to > or equal to 15 lbs. and L grip from 5 lbs to > or equal to 10 lbs.   Baseline 01/06/2015: Pt. able to improve R grip to 17 lbs and L grip to 10 lbs.     Time 4   Period Weeks   Status Achieved   PT SHORT TERM GOAL #4   Title The patient will report pain < or equal to 3/10 at rest.   Baseline 01/06/2015: pt. reports pain at 4/10 at rest.     Time 4   Period Weeks   Status On-going           PT Long Term Goals - 01/15/15 1219    PT LONG TERM GOAL #1   Title  The patient will  improve neck disability index by > or equal to 15% (from 54% baseline).   Baseline Target date 01/31/2015   Time 6   Period Weeks   Status On-going   PT LONG TERM GOAL #2   Title The patient will imrpove neck rotation R from 34 deg to 45 deg and L from 22 deg to 40 deg.   Baseline Patient met with R rotation at 52 deg and L rotation at 50 deg.   Time 6   Period Weeks   Status Achieved   PT LONG TERM GOAL #3   Title The patient will improve shoulder A/ROM to > or equal to 140 degrees bilaterally in seated position.   Baseline Patient has shoulder flexion > or equal to 150 degrees.   Time 6   Period Weeks   Status Achieved   PT LONG TERM GOAL #4   Title The patient will be able to reach overhead to fix hair without reports of arms going numb.   Baseline Target date 01/31/2015   Time 6   Period Weeks   Status On-going           Plan - 01/23/15 1108    Clinical Impression Statement Skilled session today focused on decreased tightness of cervical muscles with manual therapy and then on habiltuation acitivities for pt's dizziness with head movements. Pt reported increased dizziness and nausea with activites that did resolve with rest. Pt  making steady progress toward goals.  Pt will benefit from skilled therapeutic intervention in order to improve on the following deficits Abnormal gait;Decreased activity tolerance;Decreased mobility;Decreased strength;Decreased range of motion;Impaired flexibility;Dizziness;Pain   Rehab Potential Good   PT Frequency 2x / week   PT Duration 6 weeks   PT Treatment/Interventions ADLs/Self Care Home Management;Balance training;Neuromuscular re-education;Therapeutic activities;Functional mobility training;Patient/family education;Therapeutic exercise;Manual techniques;Cryotherapy;Traction;Passive range of motion   PT Next Visit Plan cervical stretches, cont with balance and gait training and habituation    Consulted and Agree with Plan of Care Patient        Problem List Patient Active Problem List   Diagnosis Date Noted  . Common migraine 05/14/2014  . Abnormality of gait 05/14/2014  . Memory change 05/14/2014  . Aneurysm, cerebral, nonruptured 05/14/2014  . Cramp of limb-Left neck 05/10/2014  . Hematemesis with nausea   . Vomiting blood   . Dizziness and giddiness 09/21/2013  . Atypical chest pain 07/18/2013  . Unstable angina ([place]) 04/09/2013  . Carotid artery stenosis, symptomatic 03/20/2013  . Chronic diastolic CHF (congestive heart failure) ([place]) 09/23/2012  . Cerebrovascular disease 07/10/2012  . Cerebral artery occlusion with cerebral infarction ([place]) 07/10/2012  . Mitral regurgitation 04/15/2012  . TIA (transient ischemic attack) 04/15/2012  . Occlusion and stenosis of carotid artery without mention of cerebral infarction 08/18/2011  . Bariatric surgery status 06/17/2011  . Speech abnormality 03/22/2011  . Dyspnea 02/24/2011  . PAPILLARY MUSCLE DYSFUNCTION, NON-RHEUMATIC 10/09/2008  . UNSPECIFIED VITAMIN D DEFICIENCY 10/24/2007  . MYOCARDIAL INFARCTION, HX OF 10/24/2007  . PERSISTENT VOMITING 10/24/2007  . OSTEOARTHRITIS 10/24/2007  . MIGRAINES, HX OF 10/24/2007  . DM 01/16/2007  . Hyperlipemia 01/16/2007  . Obesity-post failed open gastroplasty 1984  01/16/2007  . OBSTRUCTIVE SLEEP APNEA 01/16/2007  . Essential hypertension 01/16/2007  . Coronary atherosclerosis 01/16/2007  . ASTHMA 01/16/2007  . GERD 01/16/2007  . VENTRAL HERNIA 01/16/2007    Willow Ora 01/24/2015, 3:55 PM  Willow Ora, PTA, [facility] [address], [place], [place] [address] [phone] 01/24/2015, 3:55 PM   Name: POPPY MCAFEE [name]: [id number] Date of Birth: Jun 10, 1945

## 2015-01-28 ENCOUNTER — Ambulatory Visit: Payer: Medicare Other | Admitting: Rehabilitative and Restorative Service Providers"

## 2015-01-28 DIAGNOSIS — R293 Abnormal posture: Secondary | ICD-10-CM

## 2015-01-28 DIAGNOSIS — M542 Cervicalgia: Secondary | ICD-10-CM | POA: Diagnosis not present

## 2015-01-28 DIAGNOSIS — M6281 Muscle weakness (generalized): Secondary | ICD-10-CM

## 2015-01-29 NOTE — Therapy (Signed)
[facility] [address] [place], [place], [address] Phone: [phone]   Fax:  [phone]  Physical Therapy Treatment  Patient Details  Name: [name] [name] [name]: [id number] Date of Birth: 31-Oct-1945 Referring Provider: Lenor Coffin, MD  Encounter Date: 01/28/2015      PT End of Session - 01/29/15 1406    Visit Number 14   Number of Visits 17   Date for PT Re-Evaluation 02/28/15   Authorization Type G code every 10th visit   PT Start Time 1235   PT Stop Time 1315   PT Time Calculation (min) 40 min      Past Medical History  Diagnosis Date  . Vomiting     persistent  . Asthma   . Ventral hernia     hx of  . GERD (gastroesophageal reflux disease)   . Obesity 01-2010  . Hyperlipidemia   . Hypertension   . Irregular heart beat   . Coronary artery disease   . Heart murmur   . Anxiety   . PONV (postoperative nausea and vomiting)   . Peripheral vascular disease (HCC)     ? numbness, tingling arms and legs  . Anemia     hx  . H/O hiatal hernia   . Neuromuscular disorder ([place])     ?  Marland Kitchen Dysrhythmia   . Mental disorder     hx of confusion  . CHF (congestive heart failure) ([place])   . Anginal pain ([place])   . Pneumonia 2000's    "once"  . Chronic bronchitis ([place])     "off and on all the time" (07/18/2013)  . Claustrophobia   . Type II diabetes mellitus ([place])   . Chronic lower back pain   . Other and unspecified angina pectoris   . Obstructive sleep apnea     "can't wear machine; I have claustrophobia" (07/18/2013)  . Osteoarthritis     "knees and hands" (07/18/2013)  . Bundle branch block, left     chronic/notes 07/18/2013  . Vomiting blood   . Myocardial infarction ([place])     04/1999, 02/2000, 01/2005; 2011; 2014  . Shortness of breath     with exertion  . Chronic kidney disease     frequency, sses dr Jamal Maes every 4 to 6 months  . Migraine     "used to have them really bad; kind of  eased up now; down to 1 q couple months" (07/18/2013)  . Headache     "probably 4 days/wk" (07/18/2013)  . Leg cramps     both legs at times  . Stroke ([place]) 03/22/12    right side brain; denies residual on 07/18/2013)  . Stroke [facility]) Oct. 2015    Affected pt.s balance  . Cancer [facility]) April 2016    BCC,  Melanoma  . Common migraine 05/14/2014  . Abnormality of gait 05/14/2014  . Memory change 05/14/2014    Past Surgical History  Procedure Laterality Date  . Tumor removal  1970's    ovarian  . Tibia fracture surgery Right 1970's    rods and pins  . Gastric bypass  1984    "stapeling"  . Total abdominal hysterectomy  1970's    w/ appendectomy  . Ankle fracture surgery Left 1970's  . Fracture surgery    . Esophagogastroduodenoscopy  08/03/2011    Procedure: ESOPHAGOGASTRODUODENOSCOPY (EGD);  Surgeon: Shann Medal, MD;  Location: [facility];  Service: General;  Laterality: N/A;  Kennyth Arnold  stone removal  05/2003  . Root canal  10/2000  . Appendectomy  1970's    w/hysterectomy  . Coronary angioplasty with stent placement  01,02,05,06,07,08,11; 04/24/2013    "I've probably got ~ 10 stents by now" (04/24/2013)  . Coronary angioplasty with stent placement  06/13/2013    "got 4 stents today" (06/13/2013)  . Cardiac catheterization  10/10/2012    Dr [name].  . Carotid endarterectomy Left 03/2013  . Cerebral angiogram N/A 04/05/2011    Procedure: CEREBRAL ANGIOGRAM;  Surgeon: Angelia Mould, MD;  Location: [facility] CATH LAB;  Service: Cardiovascular;  Laterality: N/A;  . Carotid stent insertion Left 03/20/2013    Procedure: CAROTID STENT INSERTION;  Surgeon: [name], MD;  Location: [facility] CATH LAB;  Service: Cardiovascular;  Laterality: Left;  internal carotid  . Percutaneous stent intervention N/A 04/24/2013    Procedure: PERCUTANEOUS STENT INTERVENTION;  Surgeon: [name], MD;  Location: [facility] CATH LAB;  Service: Cardiovascular;  Laterality: N/A;  . Percutaneous coronary stent  intervention (pci-s) N/A 06/13/2013    Procedure: PERCUTANEOUS CORONARY STENT INTERVENTION (PCI-S);  Surgeon: [name], MD;  Location: [facility] CATH LAB;  Service: Cardiovascular;  Laterality: N/A;  . Coronary artery bypass graft  1220/11    "CABG X5"  . Cholecystectomy  2004  . Hernia repair  2004    "in my stomach; had OR on it twice", wire mesh on 1 hernia  . Esophagogastroduodenoscopy (egd) with propofol N/A 03/11/2014    Procedure: ESOPHAGOGASTRODUODENOSCOPY (EGD) WITH PROPOFOL;  Surgeon: Lafayette Dragon, MD;  Location: [facility];  Service: Endoscopy;  Laterality: N/A;  . Tooth extraction      There were no vitals filed for this visit.  Visit Diagnosis:  Generalized muscle weakness  Postural instability  Neck pain      Subjective Assessment - 01/28/15 1232    Subjective The patient overexerted herself on Christmas Eve and Christmas Day with cooking.  She reports she has numbness in her left arm since that time.  The patient was very dizzy after last session and remained dizzy throughout the remainder of the day ( it imporoved, but remained).     Patient Stated Goals Address neck pain and arm numbness   Currently in Pain? Yes   Pain Score 7    Pain Location Shoulder  and neck   Pain Orientation Left   Pain Descriptors / Indicators Aching;Sore;Numbness   Pain Type Chronic pain   Pain Radiating Towards radiates down into arms   Pain Onset More than a month ago   Pain Frequency Constant   Aggravating Factors  worse after hosting dinners and large amounts of cooking/carrying heavy pans   Pain Relieving Factors stretching     THERAPEUTIC EXERCISE: Supine postural stretching for trunk Seated scapular retraction with arms horizontally ab/adducting emphasizing shoulder retraction Seated neck A/ROM Reviewed prior HEP.  Gait: Ambulation x 115 ft with fatigue and pain.  Patient and PT discussed frequent walking to begin to increase mobility and tolerance to movement. Gait  speed= 1.71 ft/sec  NEUROMUSCULAR RE-EDUCATION: Seated gaze x 1 viewing to tolerance Habituation horizontal head turns with emphasis on small, slow movements performed to tolerance with dizziness limiting motion Standing head turns near support surface       PT Short Term Goals - 01/28/15 1253    PT SHORT TERM GOAL #1   Title The patient will be independent with HEP for postural flexibility, neck stretching, and postural stabilization.   Baseline 01/06/2015: Pt.  able to verbalize / demo independence with HEP.     Time 4   Period Weeks   Status Achieved   PT SHORT TERM GOAL #2   Title The patient will improve neck ROM flexion/extension from 8 degrees to > or equal to 18 degrees.   Baseline 01/06/2015: Pt. able to improve neck ROM flexion/extension to > 21 degrees.     Time 4   Period Weeks   Status Achieved   PT SHORT TERM GOAL #3   Title The aptient will improve R grip from 10 lbs to > or equal to 15 lbs. and L grip from 5 lbs to > or equal to 10 lbs.   Baseline 01/06/2015: Pt. able to improve R grip to 17 lbs and L grip to 10 lbs.     Time 4   Period Weeks   Status Achieved   PT SHORT TERM GOAL #4   Title The patient will report pain < or equal to 3/10 at rest.   Baseline Patient's pain varies and can still be as high as 7/10.   Time 4   Period Weeks   Status Not Met           PT Long Term Goals - 01/29/15 1407    PT LONG TERM GOAL #1   Title The patient will  improve neck disability index by > or equal to 15% (from 54% baseline).   Baseline Modified target date to 02/28/2015   Time 4   Period Weeks   Status Revised   PT LONG TERM GOAL #2   Title The patient will imrpove neck rotation R from 34 deg to 45 deg and L from 22 deg to 40 deg.   Baseline Patient met with R rotation at 52 deg and L rotation at 50 deg.   Time 6   Period Weeks   Status Achieved   PT LONG TERM GOAL #3   Title The patient will improve shoulder A/ROM to > or equal to 140 degrees bilaterally in  seated position.   Baseline Patient has shoulder flexion > or equal to 150 degrees.   Time 6   Period Weeks   Status Achieved   PT LONG TERM GOAL #4   Title The patient will be able to reach overhead to fix hair without reports of arms going numb.   Baseline Modified target date to 02/28/2015   Time 4   Period Weeks   Status Revised   PT LONG TERM GOAL #5   Title The patient will increase gait speed from 1.71 ft/sec to > or equal to 2.4 ft/sec to demo improved tolerance to movement.   Baseline Target date 02/28/2015   Time 4   Period Weeks   Status New   Additional Long Term Goals   Additional Long Term Goals Yes   PT LONG TERM GOAL #6   Title The patient will walk for 3 minutes nonstop to demo improved endurance.   Baseline Target date 02/28/2015 (patient walked x 115 feet and needed to rest)   Time 4   Period Weeks   Status New   PT LONG TERM GOAL #7   Title The patient will verbalize understanding of community program for exericse post d/c.   Baseline Target date 02/28/2015   Time 4   Period Weeks   Status New               Plan - 01/28/15 1311    Clinical Impression Statement  The patient has partially met STGs and LTGs.  At today's visit she has increased pain due to overexertion with holidays and hosting.  PT emphasizing improving overall tolerance to movement.  Patient is hindered by neck pain, arm numbness, dizziness and general decrease is mobility.  She reports fear of movement due to h/o strokes and aneurysm.    Pt will benefit from skilled therapeutic intervention in order to improve on the following deficits Abnormal gait;Decreased activity tolerance;Decreased mobility;Decreased strength;Decreased range of motion;Impaired flexibility;Dizziness;Pain   Rehab Potential Good   PT Frequency 1x / week   PT Duration 4 weeks   PT Treatment/Interventions ADLs/Self Care Home Management;Balance training;Neuromuscular re-education;Therapeutic activities;Functional mobility  training;Patient/family education;Therapeutic exercise;Manual techniques;Cryotherapy;Traction;Passive range of motion   PT Next Visit Plan Neck ROM, head movements, standing gait activities emphasizing safety with mobility, endurance.     Consulted and Agree with Plan of Care Patient        Rudell Cobb, PT 01/29/2015, 2:12 PM  [facility] [address] [place], [place], [address] Phone: [phone]   Fax:  [phone]  Name: [name] [name] [name]: [id number] Date of Birth: 07-02-1945

## 2015-01-30 ENCOUNTER — Ambulatory Visit: Payer: Medicare Other | Admitting: Physical Therapy

## 2015-02-04 ENCOUNTER — Encounter: Payer: Medicare Other | Attending: Cardiology | Admitting: Dietician

## 2015-02-04 ENCOUNTER — Encounter: Payer: Self-pay | Admitting: Dietician

## 2015-02-04 DIAGNOSIS — Z794 Long term (current) use of insulin: Secondary | ICD-10-CM | POA: Insufficient documentation

## 2015-02-04 DIAGNOSIS — Z713 Dietary counseling and surveillance: Secondary | ICD-10-CM | POA: Diagnosis not present

## 2015-02-04 DIAGNOSIS — E118 Type 2 diabetes mellitus with unspecified complications: Secondary | ICD-10-CM | POA: Insufficient documentation

## 2015-02-04 DIAGNOSIS — Z6841 Body Mass Index (BMI) 40.0 and over, adult: Secondary | ICD-10-CM | POA: Insufficient documentation

## 2015-02-04 DIAGNOSIS — E119 Type 2 diabetes mellitus without complications: Secondary | ICD-10-CM

## 2015-02-04 NOTE — Progress Notes (Signed)
Medical Nutrition Therapy:  Appt start time: 0730 and [id number] end time:  [id number].   Assessment:  Primary concerns today: Patient is here today for a follow up.  She would like to lose weight.  She states that "she hates herself" and that weight is a big part of this.   Type 2 diagnosed in 2001. Other hx includes stage 3 CKD (potassium 5.1 and GFR 43.8 10/23/14- she is followed by Dr. Lorrene Reid), CVA 2013 and 2015, MI 2001, CHF, and multiple stents. Hx also includes Gastric bypass surgery "that did not work". She reports severe vomiting at times and has slow emptying. Hernia surgery in past but feels that it has returned. No appetite and eats no breakfast. From her history, intake is not excessive. She follows a low sodium diet.  She reported that one day she did not eat or drink until 5:00pm.  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  She has made some changes in the past month, trying romaine, which she enjoys as well as continuing to incorporate salmon, tuna, and dried beans in her diet.  She has also enjoyed the Mrs. Dash seasoning products.She has been going to rehab and this has reduced her pain and they are working to improve her walking.  They asked that she go to the gym a couple of days per week in addition to rehab and she is considering using the pool there.  She wants her husband to take time off work to go with her.  She has gained a couple lbs since last visit.  Still complains of increased fluid gain/swelling.  Further discussion of her diet reveals use of regular bullion cube to season.  Discussed further need to avoid any added sodium as well as importance of improving her fluid intake and nutrition.  She states that she feels that she needs to see a Social worker.  She states that MD stated that she does not need to take the Metformin or Vitamin D at this time.  Current labs  unavailable for this visit.  Patient is here alone. She is retired and lives with her husband. Weight has always been a struggle. Lowest of 200-225 until 2001, 250 lbs UBW until 6-8 months ago then became ill, was on IV antibiotics and very poor energy for 4 moths and was unable to exercise and is at 283 lbs today. She is frustrated by her continued weight gain, has a poor body image, and depression due to health and other underlying causes. We discussed this. She is a member of a sports center 3 miles from her home but does not go.   Preferred Learning Style:   No preference indicated   Learning Readiness:   Ready  Change in progress  MEDICATIONS: see list   DIETARY INTAKE:  Usual eating pattern includes 1-2 meals and 1-2 snacks per day. Avoided foods include Many vegetables taste bitter to her.    24-hr recall:  B ( AM): often skips  Snk ( AM): often skips  L ( PM): salad with tuna Snk ( PM): nuts D ( PM): taco with lean beef/Mrs. Dash taco seasoning Snk ( PM):  Beverages: coffee, tea "hates water"  Usual physical activity: current physical rehab  Estimated energy needs: 1200 calories 135 g carbohydrates 90 g protein 33 g fat  Progress Towards Goal(s):  In progress.   Nutritional Diagnosis:  NB-1.1  Food and nutrition-related knowledge deficit As related to balance of energy intake/expenditure,sodium needs, and basic nutrition.  As evidenced by patient report and weight gain..    Intervention:  Nutrition counseling/education continued for body acceptance, weight loss, low sodium guidelines, and importance of maintaining nutrition and hydration.  Provided counseling resource and encouraged her to make an appointment.  Increase your water intake.  Have a bottle of water each morning when you wake. (use crystal lite if you need) Thinks to keep in your refrigerator for light meals.  Yogurt, fruit cup, 2 Tablespoons granola or 1/4 cup oats  Protein Bar  Unsweetened  cereal, banana, milk  Fresh fruit  Almonds or walnuts (small portions), dried fruit (small portion), plain cheerios or chex cereal  Protein shake such as Glucerna or Boost Glucose Control or Carnation Breakfast Essentials (sugar free) mix with Lactaid mild Avoid skipping meals Avoid bullion unless low sodium.  Read labels for any hidden sodium. Continue Salmon and tuna Consider counseling. Great job with rehab!  Teaching Method Utilized:  Visual Auditory  Handouts given during visit include:  My plate  Barriers to learning/adherence to lifestyle change: physical challenges and depression.  Demonstrated degree of understanding via:  Teach Back   Monitoring/Evaluation:  Dietary intake, exercise, label reading, and body weight in 1 month(s).

## 2015-02-04 NOTE — Patient Instructions (Signed)
Increase your water intake.  Have a bottle of water each morning when you wake. (use crystal lite if you need) Thinks to keep in your refrigerator for light meals.  Yogurt, fruit cup, 2 Tablespoons granola or 1/4 cup oats  Protein Bar  Unsweetened cereal, banana, milk  Fresh fruit  Almonds or walnuts (small portions), dried fruit (small portion), plain cheerios or chex cereal  Protein shake such as Glucerna or Boost Glucose Control or Carnation Breakfast Essentials (sugar free) mix with Lactaid mild Avoid skipping meals Avoid bullion unless low sodium.  Read labels for any hidden sodium. Continue Salmon and tuna Consider counseling. Great job with rehab!

## 2015-02-05 ENCOUNTER — Encounter: Payer: Self-pay | Admitting: Physical Therapy

## 2015-02-05 ENCOUNTER — Ambulatory Visit: Payer: Medicare Other | Attending: Neurology | Admitting: Physical Therapy

## 2015-02-05 DIAGNOSIS — M542 Cervicalgia: Secondary | ICD-10-CM | POA: Diagnosis present

## 2015-02-05 DIAGNOSIS — R293 Abnormal posture: Secondary | ICD-10-CM | POA: Diagnosis present

## 2015-02-05 DIAGNOSIS — M6281 Muscle weakness (generalized): Secondary | ICD-10-CM | POA: Insufficient documentation

## 2015-02-05 NOTE — Therapy (Signed)
[facility] [address] [place], [place], [address] Phone: [phone]   Fax:  [phone]  Physical Therapy Treatment  Patient Details  Name: [name] [name] [name]: [id number] Date of Birth: [birth year] Referring Provider: Lenor Coffin, MD  Encounter Date: 02/05/2015      PT End of Session - 02/05/15 1329    Visit Number 15   Number of Visits 17   Date for PT Re-Evaluation 02/28/15   Authorization Type G code every 10th visit   PT Start Time 1318   PT Stop Time 1400   PT Time Calculation (min) 42 min   Activity Tolerance Patient tolerated treatment well;Other (comment)  did have increased dizziness/nausea with some activities, decreased with rest   Behavior During Therapy [facility] for tasks assessed/performed      Past Medical History  Diagnosis Date  . Vomiting     persistent  . Asthma   . Ventral hernia     hx of  . GERD (gastroesophageal reflux disease)   . Obesity 01-2010  . Hyperlipidemia   . Hypertension   . Irregular heart beat   . Coronary artery disease   . Heart murmur   . Anxiety   . PONV (postoperative nausea and vomiting)   . Peripheral vascular disease (HCC)     ? numbness, tingling arms and legs  . Anemia     hx  . H/O hiatal hernia   . Neuromuscular disorder ([place])     ?  Marland Kitchen Dysrhythmia   . Mental disorder     hx of confusion  . CHF (congestive heart failure) ([place])   . Anginal pain ([place])   . Pneumonia 2000's    "once"  . Chronic bronchitis ([place])     "off and on all the time" (07/18/2013)  . Claustrophobia   . Type II diabetes mellitus ([place])   . Chronic lower back pain   . Other and unspecified angina pectoris   . Obstructive sleep apnea     "can't wear machine; I have claustrophobia" (07/18/2013)  . Osteoarthritis     "knees and hands" (07/18/2013)  . Bundle branch block, left     chronic/notes 07/18/2013  . Vomiting blood   . Myocardial infarction ([place])     04/1999, 02/2000,  01/2005; 2011; 2014  . Shortness of breath     with exertion  . Chronic kidney disease     frequency, sses dr Jamal Maes every 4 to 6 months  . Migraine     "used to have them really bad; kind of eased up now; down to 1 q couple months" (07/18/2013)  . Headache     "probably 4 days/wk" (07/18/2013)  . Leg cramps     both legs at times  . Stroke ([place]) 03/22/12    right side brain; denies residual on 07/18/2013)  . Stroke [facility]) Oct. 2015    Affected pt.s balance  . Cancer [facility]) April 2016    BCC,  Melanoma  . Common migraine 05/14/2014  . Abnormality of gait 05/14/2014  . Memory change 05/14/2014    Past Surgical History  Procedure Laterality Date  . Tumor removal  1970's    ovarian  . Tibia fracture surgery Right 1970's    rods and pins  . Gastric bypass  1984    "stapeling"  . Total abdominal hysterectomy  1970's    w/ appendectomy  . Ankle fracture surgery Left 1970's  . Fracture surgery    .  Esophagogastroduodenoscopy  08/03/2011    Procedure: ESOPHAGOGASTRODUODENOSCOPY (EGD);  Surgeon: Shann Medal, MD;  Location: [facility];  Service: General;  Laterality: N/A;  . Gall stone removal  05/2003  . Root canal  10/2000  . Appendectomy  1970's    w/hysterectomy  . Coronary angioplasty with stent placement  01,02,05,06,07,08,11; 04/24/2013    "I've probably got ~ 10 stents by now" (04/24/2013)  . Coronary angioplasty with stent placement  06/13/2013    "got 4 stents today" (06/13/2013)  . Cardiac catheterization  10/10/2012    Dr [name].  . Carotid endarterectomy Left 03/2013  . Cerebral angiogram N/A 04/05/2011    Procedure: CEREBRAL ANGIOGRAM;  Surgeon: Angelia Mould, MD;  Location: [facility] CATH LAB;  Service: Cardiovascular;  Laterality: N/A;  . Carotid stent insertion Left 03/20/2013    Procedure: CAROTID STENT INSERTION;  Surgeon: [name], MD;  Location: [facility] CATH LAB;  Service: Cardiovascular;  Laterality: Left;  internal carotid  . Percutaneous stent intervention  N/A 04/24/2013    Procedure: PERCUTANEOUS STENT INTERVENTION;  Surgeon: [name], MD;  Location: [facility] CATH LAB;  Service: Cardiovascular;  Laterality: N/A;  . Percutaneous coronary stent intervention (pci-s) N/A 06/13/2013    Procedure: PERCUTANEOUS CORONARY STENT INTERVENTION (PCI-S);  Surgeon: [name], MD;  Location: [facility] CATH LAB;  Service: Cardiovascular;  Laterality: N/A;  . Coronary artery bypass graft  1220/11    "CABG X5"  . Cholecystectomy  2004  . Hernia repair  2004    "in my stomach; had OR on it twice", wire mesh on 1 hernia  . Esophagogastroduodenoscopy (egd) with propofol N/A 03/11/2014    Procedure: ESOPHAGOGASTRODUODENOSCOPY (EGD) WITH PROPOFOL;  Surgeon: Lafayette Dragon, MD;  Location: [facility] ENDOSCOPY;  Service: Endoscopy;  Laterality: N/A;  . Tooth extraction      There were no vitals filed for this visit.  Visit Diagnosis:  Generalized muscle weakness  Postural instability  Neck pain      Subjective Assessment - 02/05/15 1323    Subjective Had a "minor fall" while going up stairs in her socks over the weekend. Only injury is a bruise on her knee. Was sick last week with stomach virus, feeling better today, only weak. Reports her dizziness has been worse overall, however she has been trying to move her head more as well. It's not constant, does ease off with rest/no head movements/closing of eyes.                                     Pertinent History Patient reports strokes noted on imaging   Limitations House hold activities;Walking   Currently in Pain? Yes   Pain Score 4    Pain Location Shoulder  across spine/neck between shoulders   Pain Orientation Right;Left   Pain Descriptors / Indicators Aching;Sore;Numbness   Pain Type Chronic pain   Pain Radiating Towards radiates down into arms   Pain Onset More than a month ago   Pain Frequency Constant   Aggravating Factors  increased activity   Pain Relieving Factors stretching     Treatment: Neuro  Re-ed: In hallway: Forward gait with left<>forward<>right head turns x 2 laps along 50-60 foot pathway. Min guard assist to min assist for balance. Pt with slow, cautious gait and needed to stop x 3 to allow dizziness to decrease. 115 feet with fixed gaze on target after each turn, min guard to  min assist for balance. Pt needed reminder cues to fix gaze to decrease dizziness with mobility.   Seated edge of mat: Alternating UE diagonal raises with eyes fixed on hand and head movements tracking hand x 10 each side. Time needed to allow dizziness to decrease. Shoulder horizontal abduction with eyes fixed on hand and head movements tracking hand x 10 each side.  bending forward and back up to retrieve cones from floor and the put them back down (4 cones).  X 2 reps each way. Significant increase is dizziness and nausea. Increased time to recover.        PT Short Term Goals - 01/28/15 1253    PT SHORT TERM GOAL #1   Title The patient will be independent with HEP for postural flexibility, neck stretching, and postural stabilization.   Baseline 01/06/2015: Pt. able to verbalize / demo independence with HEP.     Time 4   Period Weeks   Status Achieved   PT SHORT TERM GOAL #2   Title The patient will improve neck ROM flexion/extension from 8 degrees to > or equal to 18 degrees.   Baseline 01/06/2015: Pt. able to improve neck ROM flexion/extension to > 21 degrees.     Time 4   Period Weeks   Status Achieved   PT SHORT TERM GOAL #3   Title The aptient will improve R grip from 10 lbs to > or equal to 15 lbs. and L grip from 5 lbs to > or equal to 10 lbs.   Baseline 01/06/2015: Pt. able to improve R grip to 17 lbs and L grip to 10 lbs.     Time 4   Period Weeks   Status Achieved   PT SHORT TERM GOAL #4   Title The patient will report pain < or equal to 3/10 at rest.   Baseline Patient's pain varies and can still be as high as 7/10.   Time 4   Period Weeks   Status Not Met           PT  Long Term Goals - 01/29/15 1407    PT LONG TERM GOAL #1   Title The patient will  improve neck disability index by > or equal to 15% (from 54% baseline).   Baseline Modified target date to 02/28/2015   Time 4   Period Weeks   Status Revised   PT LONG TERM GOAL #2   Title The patient will imrpove neck rotation R from 34 deg to 45 deg and L from 22 deg to 40 deg.   Baseline Patient met with R rotation at 52 deg and L rotation at 50 deg.   Time 6   Period Weeks   Status Achieved   PT LONG TERM GOAL #3   Title The patient will improve shoulder A/ROM to > or equal to 140 degrees bilaterally in seated position.   Baseline Patient has shoulder flexion > or equal to 150 degrees.   Time 6   Period Weeks   Status Achieved   PT LONG TERM GOAL #4   Title The patient will be able to reach overhead to fix hair without reports of arms going numb.   Baseline Modified target date to 02/28/2015   Time 4   Period Weeks   Status Revised   PT LONG TERM GOAL #5   Title The patient will increase gait speed from 1.71 ft/sec to > or equal to 2.4 ft/sec to demo improved tolerance to movement.  Baseline Target date 02/28/2015   Time 4   Period Weeks   Status New   Additional Long Term Goals   Additional Long Term Goals Yes   PT LONG TERM GOAL #6   Title The patient will walk for 3 minutes nonstop to demo improved endurance.   Baseline Target date 02/28/2015 (patient walked x 115 feet and needed to rest)   Time 4   Period Weeks   Status New   PT LONG TERM GOAL #7   Title The patient will verbalize understanding of community program for exericse post d/c.   Baseline Target date 02/28/2015   Time 4   Period Weeks   Status New               Plan - 02/05/15 1329    Clinical Impression Statement Pt continues to report increased dizziness/nausea with head movements. Responded well to gaze stabilization during gait with decreased symptoms reported. Pt making steady progress toward goals.   Pt will  benefit from skilled therapeutic intervention in order to improve on the following deficits Abnormal gait;Decreased activity tolerance;Decreased mobility;Decreased strength;Decreased range of motion;Impaired flexibility;Dizziness;Pain   Rehab Potential Good   PT Frequency 1x / week   PT Duration 4 weeks   PT Treatment/Interventions ADLs/Self Care Home Management;Balance training;Neuromuscular re-education;Therapeutic activities;Functional mobility training;Patient/family education;Therapeutic exercise;Manual techniques;Cryotherapy;Traction;Passive range of motion   PT Next Visit Plan Neck ROM, head movements, standing gait activities emphasizing safety with mobility, endurance.     Consulted and Agree with Plan of Care Patient        Problem List Patient Active Problem List   Diagnosis Date Noted  . Common migraine 05/14/2014  . Abnormality of gait 05/14/2014  . Memory change 05/14/2014  . Aneurysm, cerebral, nonruptured 05/14/2014  . Cramp of limb-Left neck 05/10/2014  . Hematemesis with nausea   . Vomiting blood   . Dizziness and giddiness 09/21/2013  . Atypical chest pain 07/18/2013  . Unstable angina ([place]) 04/09/2013  . Carotid artery stenosis, symptomatic 03/20/2013  . Chronic diastolic CHF (congestive heart failure) ([place]) 09/23/2012  . Cerebrovascular disease 07/10/2012  . Cerebral artery occlusion with cerebral infarction ([place]) 07/10/2012  . Mitral regurgitation 04/15/2012  . TIA (transient ischemic attack) 04/15/2012  . Occlusion and stenosis of carotid artery without mention of cerebral infarction 08/18/2011  . Bariatric surgery status 06/17/2011  . Speech abnormality 03/22/2011  . Dyspnea 02/24/2011  . PAPILLARY MUSCLE DYSFUNCTION, NON-RHEUMATIC 10/09/2008  . UNSPECIFIED VITAMIN D DEFICIENCY 10/24/2007  . MYOCARDIAL INFARCTION, HX OF 10/24/2007  . PERSISTENT VOMITING 10/24/2007  . OSTEOARTHRITIS 10/24/2007  . MIGRAINES, HX OF 10/24/2007  . DM 01/16/2007  .  Hyperlipemia 01/16/2007  . Obesity-post failed open gastroplasty 1984  01/16/2007  . OBSTRUCTIVE SLEEP APNEA 01/16/2007  . Essential hypertension 01/16/2007  . Coronary atherosclerosis 01/16/2007  . ASTHMA 01/16/2007  . GERD 01/16/2007  . VENTRAL HERNIA 01/16/2007    Willow Ora 02/06/2015, 3:10 PM  Willow Ora, PTA, [facility] [address] [place], [place] [address] [phone] 02/06/2015, 3:10 PM   Name: [name] [name] [name]: [id number] Date of Birth: 1945-03-24

## 2015-02-10 ENCOUNTER — Ambulatory Visit: Payer: Medicare Other | Admitting: Rehabilitative and Restorative Service Providers"

## 2015-02-13 ENCOUNTER — Encounter: Payer: Self-pay | Admitting: Physical Therapy

## 2015-02-13 ENCOUNTER — Ambulatory Visit: Payer: Medicare Other | Admitting: Physical Therapy

## 2015-02-13 DIAGNOSIS — R293 Abnormal posture: Secondary | ICD-10-CM

## 2015-02-13 DIAGNOSIS — M6281 Muscle weakness (generalized): Secondary | ICD-10-CM

## 2015-02-13 DIAGNOSIS — M542 Cervicalgia: Secondary | ICD-10-CM

## 2015-02-13 NOTE — Therapy (Signed)
[facility] [address] [place], [place], [address] Phone: [phone]   Fax:  [phone]  Physical Therapy Treatment  Patient Details  Name: [name] [name] [name]: [id number] Date of Birth: [birth year] Referring Provider: Lenor Coffin, MD  Encounter Date: 02/13/2015      PT End of Session - 02/13/15 0901    Visit Number 16   Number of Visits 17   Date for PT Re-Evaluation 02/28/15   Authorization Type G code every 10th visit   PT Start Time 0850   PT Stop Time 0930   PT Time Calculation (min) 40 min   Activity Tolerance Patient tolerated treatment well;Other (comment)  did have increased dizziness/nausea with some activities, decreased with rest   Behavior During Therapy [facility] for tasks assessed/performed      Past Medical History  Diagnosis Date  . Vomiting     persistent  . Asthma   . Ventral hernia     hx of  . GERD (gastroesophageal reflux disease)   . Obesity 01-2010  . Hyperlipidemia   . Hypertension   . Irregular heart beat   . Coronary artery disease   . Heart murmur   . Anxiety   . PONV (postoperative nausea and vomiting)   . Peripheral vascular disease (HCC)     ? numbness, tingling arms and legs  . Anemia     hx  . H/O hiatal hernia   . Neuromuscular disorder ([place])     ?  Marland Kitchen Dysrhythmia   . Mental disorder     hx of confusion  . CHF (congestive heart failure) ([place])   . Anginal pain ([place])   . Pneumonia 2000's    "once"  . Chronic bronchitis ([place])     "off and on all the time" (07/18/2013)  . Claustrophobia   . Type II diabetes mellitus ([place])   . Chronic lower back pain   . Other and unspecified angina pectoris   . Obstructive sleep apnea     "can't wear machine; I have claustrophobia" (07/18/2013)  . Osteoarthritis     "knees and hands" (07/18/2013)  . Bundle branch block, left     chronic/notes 07/18/2013  . Vomiting blood   . Myocardial infarction ([place])     04/1999, 02/2000,  01/2005; 2011; 2014  . Shortness of breath     with exertion  . Chronic kidney disease     frequency, sses dr Jamal Maes every 4 to 6 months  . Migraine     "used to have them really bad; kind of eased up now; down to 1 q couple months" (07/18/2013)  . Headache     "probably 4 days/wk" (07/18/2013)  . Leg cramps     both legs at times  . Stroke ([place]) 03/22/12    right side brain; denies residual on 07/18/2013)  . Stroke [facility]) Oct. 2015    Affected pt.s balance  . Cancer [facility]) April 2016    BCC,  Melanoma  . Common migraine 05/14/2014  . Abnormality of gait 05/14/2014  . Memory change 05/14/2014    Past Surgical History  Procedure Laterality Date  . Tumor removal  1970's    ovarian  . Tibia fracture surgery Right 1970's    rods and pins  . Gastric bypass  1984    "stapeling"  . Total abdominal hysterectomy  1970's    w/ appendectomy  . Ankle fracture surgery Left 1970's  . Fracture surgery    .  Esophagogastroduodenoscopy  08/03/2011    Procedure: ESOPHAGOGASTRODUODENOSCOPY (EGD);  Surgeon: Shann Medal, MD;  Location: [facility];  Service: General;  Laterality: N/A;  . Gall stone removal  05/2003  . Root canal  10/2000  . Appendectomy  1970's    w/hysterectomy  . Coronary angioplasty with stent placement  01,02,05,06,07,08,11; 04/24/2013    "I've probably got ~ 10 stents by now" (04/24/2013)  . Coronary angioplasty with stent placement  06/13/2013    "got 4 stents today" (06/13/2013)  . Cardiac catheterization  10/10/2012    Dr [name].  . Carotid endarterectomy Left 03/2013  . Cerebral angiogram N/A 04/05/2011    Procedure: CEREBRAL ANGIOGRAM;  Surgeon: Angelia Mould, MD;  Location: [facility] CATH LAB;  Service: Cardiovascular;  Laterality: N/A;  . Carotid stent insertion Left 03/20/2013    Procedure: CAROTID STENT INSERTION;  Surgeon: [name], MD;  Location: [facility] CATH LAB;  Service: Cardiovascular;  Laterality: Left;  internal carotid  . Percutaneous stent intervention  N/A 04/24/2013    Procedure: PERCUTANEOUS STENT INTERVENTION;  Surgeon: [name], MD;  Location: [facility] CATH LAB;  Service: Cardiovascular;  Laterality: N/A;  . Percutaneous coronary stent intervention (pci-s) N/A 06/13/2013    Procedure: PERCUTANEOUS CORONARY STENT INTERVENTION (PCI-S);  Surgeon: [name], MD;  Location: [facility] CATH LAB;  Service: Cardiovascular;  Laterality: N/A;  . Coronary artery bypass graft  1220/11    "CABG X5"  . Cholecystectomy  2004  . Hernia repair  2004    "in my stomach; had OR on it twice", wire mesh on 1 hernia  . Esophagogastroduodenoscopy (egd) with propofol N/A 03/11/2014    Procedure: ESOPHAGOGASTRODUODENOSCOPY (EGD) WITH PROPOFOL;  Surgeon: Lafayette Dragon, MD;  Location: [facility] ENDOSCOPY;  Service: Endoscopy;  Laterality: N/A;  . Tooth extraction      There were no vitals filed for this visit.  Visit Diagnosis:  Generalized muscle weakness  Postural instability  Neck pain      Subjective Assessment - 02/13/15 0857    Subjective No new falls. Does report having a bad dizzy spell with walking around [employer] with her sister after lunch this past Friday, did not fall however felt like she was going too. Reports her pain has been worse with cold weather. Has been using heat and icy hot to help with pain.   Pertinent History Patient reports strokes noted on imaging   Limitations House hold activities;Walking   Patient Stated Goals Address neck pain and arm numbness   Currently in Pain? Yes   Pain Score 8    Pain Location Neck  across shoulders   Pain Orientation Right;Left   Pain Descriptors / Indicators Aching;Sore;Numbness   Pain Type Chronic pain   Pain Radiating Towards radiates down into arms   Pain Onset More than a month ago   Pain Frequency Constant   Aggravating Factors  increased activity   Pain Relieving Factors stretching   Effect of Pain on Daily Activities not stated     Treatment: Neuro Re-ed: In hallway: Forward gait  with left<>forward<>right head turns x 2 laps along 50-60 foot pathway. Min guard assist to min assist for balance. Pt with slow, cautious gait and needed to stop x 2 to allow dizziness to decrease. Forward gait with up<>forward<>down head turns x 2 laps along 50-60 foot pathway. Min guard to min assist for balance. Pt with slow, cautious gait and needed to stop x 3 to allow dizziness to decrease.   Seated edge of  mat: Alternating UE diagonal raises with eyes fixed on hand and head movements tracking hand x 10 each side. Time needed to allow dizziness to decrease afterwards. Shoulder horizontal abduction with eyes fixed on hand and head movements tracking hand x 10 each side. Time needed to allow dizziness to decrease afterwards. bending forward and back up to retrieve cones from floor and the put them back down (4 cones). X 2 reps each way. Time needed to allow dizziness to decrease afterwards.            PT Short Term Goals - 01/28/15 1253    PT SHORT TERM GOAL #1   Title The patient will be independent with HEP for postural flexibility, neck stretching, and postural stabilization.   Baseline 01/06/2015: Pt. able to verbalize / demo independence with HEP.     Time 4   Period Weeks   Status Achieved   PT SHORT TERM GOAL #2   Title The patient will improve neck ROM flexion/extension from 8 degrees to > or equal to 18 degrees.   Baseline 01/06/2015: Pt. able to improve neck ROM flexion/extension to > 21 degrees.     Time 4   Period Weeks   Status Achieved   PT SHORT TERM GOAL #3   Title The aptient will improve R grip from 10 lbs to > or equal to 15 lbs. and L grip from 5 lbs to > or equal to 10 lbs.   Baseline 01/06/2015: Pt. able to improve R grip to 17 lbs and L grip to 10 lbs.     Time 4   Period Weeks   Status Achieved   PT SHORT TERM GOAL #4   Title The patient will report pain < or equal to 3/10 at rest.   Baseline Patient's pain varies and can still be as high as 7/10.    Time 4   Period Weeks   Status Not Met           PT Long Term Goals - 01/29/15 1407    PT LONG TERM GOAL #1   Title The patient will  improve neck disability index by > or equal to 15% (from 54% baseline).   Baseline Modified target date to 02/28/2015   Time 4   Period Weeks   Status Revised   PT LONG TERM GOAL #2   Title The patient will imrpove neck rotation R from 34 deg to 45 deg and L from 22 deg to 40 deg.   Baseline Patient met with R rotation at 52 deg and L rotation at 50 deg.   Time 6   Period Weeks   Status Achieved   PT LONG TERM GOAL #3   Title The patient will improve shoulder A/ROM to > or equal to 140 degrees bilaterally in seated position.   Baseline Patient has shoulder flexion > or equal to 150 degrees.   Time 6   Period Weeks   Status Achieved   PT LONG TERM GOAL #4   Title The patient will be able to reach overhead to fix hair without reports of arms going numb.   Baseline Modified target date to 02/28/2015   Time 4   Period Weeks   Status Revised   PT LONG TERM GOAL #5   Title The patient will increase gait speed from 1.71 ft/sec to > or equal to 2.4 ft/sec to demo improved tolerance to movement.   Baseline Target date 02/28/2015   Time 4  Period Weeks   Status New   Additional Long Term Goals   Additional Long Term Goals Yes   PT LONG TERM GOAL #6   Title The patient will walk for 3 minutes nonstop to demo improved endurance.   Baseline Target date 02/28/2015 (patient walked x 115 feet and needed to rest)   Time 4   Period Weeks   Status New   PT LONG TERM GOAL #7   Title The patient will verbalize understanding of community program for exericse post d/c.   Baseline Target date 02/28/2015   Time 4   Period Weeks   Status New           Plan - 02/13/15 0901    Clinical Impression Statement Pt continues to report increased dizziness with head movements, however of lesser intensity than with last session and less time needed to recover. No  nausea reported today. Pt continues to make slow, steady progress toward goals.   Pt will benefit from skilled therapeutic intervention in order to improve on the following deficits Abnormal gait;Decreased activity tolerance;Decreased mobility;Decreased strength;Decreased range of motion;Impaired flexibility;Dizziness;Pain   Rehab Potential Good   PT Frequency 1x / week   PT Duration 4 weeks   PT Treatment/Interventions ADLs/Self Care Home Management;Balance training;Neuromuscular re-education;Therapeutic activities;Functional mobility training;Patient/family education;Therapeutic exercise;Manual techniques;Cryotherapy;Traction;Passive range of motion   PT Next Visit Plan Neck ROM, head movements, standing gait activities emphasizing safety with mobility, endurance.     Consulted and Agree with Plan of Care Patient        Problem List Patient Active Problem List   Diagnosis Date Noted  . Common migraine 05/14/2014  . Abnormality of gait 05/14/2014  . Memory change 05/14/2014  . Aneurysm, cerebral, nonruptured 05/14/2014  . Cramp of limb-Left neck 05/10/2014  . Hematemesis with nausea   . Vomiting blood   . Dizziness and giddiness 09/21/2013  . Atypical chest pain 07/18/2013  . Unstable angina ([place]) 04/09/2013  . Carotid artery stenosis, symptomatic 03/20/2013  . Chronic diastolic CHF (congestive heart failure) ([place]) 09/23/2012  . Cerebrovascular disease 07/10/2012  . Cerebral artery occlusion with cerebral infarction ([place]) 07/10/2012  . Mitral regurgitation 04/15/2012  . TIA (transient ischemic attack) 04/15/2012  . Occlusion and stenosis of carotid artery without mention of cerebral infarction 08/18/2011  . Bariatric surgery status 06/17/2011  . Speech abnormality 03/22/2011  . Dyspnea 02/24/2011  . PAPILLARY MUSCLE DYSFUNCTION, NON-RHEUMATIC 10/09/2008  . UNSPECIFIED VITAMIN D DEFICIENCY 10/24/2007  . MYOCARDIAL INFARCTION, HX OF 10/24/2007  . PERSISTENT VOMITING 10/24/2007   . OSTEOARTHRITIS 10/24/2007  . MIGRAINES, HX OF 10/24/2007  . DM 01/16/2007  . Hyperlipemia 01/16/2007  . Obesity-post failed open gastroplasty 1984  01/16/2007  . OBSTRUCTIVE SLEEP APNEA 01/16/2007  . Essential hypertension 01/16/2007  . Coronary atherosclerosis 01/16/2007  . ASTHMA 01/16/2007  . GERD 01/16/2007  . VENTRAL HERNIA 01/16/2007    Willow Ora 02/13/2015, 4:05 PM  Willow Ora, PTA, [facility] [address] [place], [place] [address] [phone] 02/13/2015, 4:05 PM   Name: [name] [name] [name]: [id number] Date of Birth: 1946-01-22

## 2015-02-14 ENCOUNTER — Ambulatory Visit (INDEPENDENT_AMBULATORY_CARE_PROVIDER_SITE_OTHER): Payer: Medicare Other | Admitting: Cardiology

## 2015-02-14 VITALS — BP 130/80 | HR 64 | Ht 62.5 in | Wt 286.1 lb

## 2015-02-14 DIAGNOSIS — I6523 Occlusion and stenosis of bilateral carotid arteries: Secondary | ICD-10-CM

## 2015-02-14 DIAGNOSIS — I5032 Chronic diastolic (congestive) heart failure: Secondary | ICD-10-CM | POA: Diagnosis not present

## 2015-02-14 DIAGNOSIS — E785 Hyperlipidemia, unspecified: Secondary | ICD-10-CM

## 2015-02-14 DIAGNOSIS — I340 Nonrheumatic mitral (valve) insufficiency: Secondary | ICD-10-CM

## 2015-02-14 DIAGNOSIS — I2510 Atherosclerotic heart disease of native coronary artery without angina pectoris: Secondary | ICD-10-CM | POA: Diagnosis not present

## 2015-02-14 DIAGNOSIS — I10 Essential (primary) hypertension: Secondary | ICD-10-CM

## 2015-02-14 LAB — BASIC METABOLIC PANEL
BUN: 25 mg/dL (ref 7–25)
CO2: 29 mmol/L (ref 20–31)
Calcium: 9.7 mg/dL (ref 8.6–10.4)
Chloride: 102 mmol/L (ref 98–110)
Creat: 1.04 mg/dL — ABNORMAL HIGH (ref 0.50–0.99)
Glucose, Bld: 180 mg/dL — ABNORMAL HIGH (ref 65–99)
Potassium: 4.6 mmol/L (ref 3.5–5.3)
Sodium: 140 mmol/L (ref 135–146)

## 2015-02-14 MED ORDER — FUROSEMIDE 40 MG PO TABS: ORAL_TABLET | ORAL | Status: DC

## 2015-02-14 NOTE — Patient Instructions (Signed)
Medication Instructions:  Stop triamterene/HCT.  Increase lasix (furosemide) to 60mg  two times a day. This will be 1 and 1/2 of your 40mg  tablets by mouth two times a day.  Labwork: BMET/BNP today.  Your physician recommends that you return for lab work in: 2 weeks-BMET.   Testing/Procedures: None   Follow-Up: Your physician recommends that you schedule a follow-up appointment in: 3 months with Dr [name].    Any Other Special Instructions Will Be Listed Below (If Applicable).     If you need a refill on your cardiac medications before your next appointment, please call your pharmacy.

## 2015-02-15 ENCOUNTER — Encounter: Payer: Self-pay | Admitting: Cardiology

## 2015-02-15 NOTE — Progress Notes (Signed)
Patient ID: [name] [name], female   DOB: 04/13/1945, 70 y.o.   [name]: [id number] PCP: Dr. Sandi Mariscal  70 yo with history of CAD s/p CABG, diastolic CHF, and cerebrovascular disease with history of CVA presents for cardiology followup. She had CABG x 5 in 12/11.  Prior to the CABG she had multiple PCIs.  She had a CVA in 2/14 that presented as visual loss.  She had a left carotid stent in 2/15.   Given dyspnea and chest pain that was worsened from her baseline, I took [name] [name] for left heart catheterization in 9/14.  This showed patent SVG-D, LIMA-LAD, and sequential SVG-OM branches but SVG-RCA and the native RCA were both totally occluded.  I suspect that her increased symptoms coincided with occlusion of SVG-RCA.  I started her on Imdur to see if this would help with the chest pain and dyspnea.  However, she feels like Imdur causes leg cramps and does not think that she can take it.  I next started her on ranolazine 500 mg bid and titrated this up to 1000 mg bid.  This helped some but not markedly.  Therefore, I had her see Dr. Irish Lack to address opening RCA CTO. He was able to do this in 5/15 with 4 overlapping Xience DES.  This led to resolution of exertional chest pain.   [name] [name] was started on Brilinta after PCI.  She became much more short of breath after starting on Brilinta.  I had her stop Brilinta and replaced it with Plavix.  Dyspnea improved off Brilinta.   No chest pain.  She feels like her dyspnea is worsening.  Weight is up 3 lbs.  She is short of breath walking about 30 yards.  She wakes up at night short of breath at times.  She has been sleeping sitting up for a long time.  She has been having episodes of dizziness/lightheadedness.  She has had poor balance.  The lightheadedness occurs with standing, but she also gets dizzy with abrupt changes in head position.  No falls or syncope.  Palpitations have decreased.     We checked orthostatics today.  BP dropped initially with standing but  quickly rebounded back to baseline.   Labs (6/14): K 3.8, creatinine 0.71 Labs (8/14): BNP 249, LDL 166 Labs (9/14): K 4.7, creatinine 0.7 Labs (9/14): K 4.6, creatinine 0.9, BNP 109 Labs (1/15): K 4.5, creatinine 0.73, LDL 212, HCT 43.4, TSH normal, BNP 138 Labs (6/15): K 4.7, creatinine 1.17, LDL 100, HDL 37, TGs 363, BNP 39 Labs (10/15): K 4.6, creatinine 1.2 Labs (1/16): LDL 157, HDL 43, K 5.2, creatinine 0.95 Labs (2/16): K 4.5, creatinine 1.08, BNP 113 Labs (4/16): LDL 50, HDL 56, TGs 179 Labs (9/16): K 5.3, creatinine 1.09 Labs (10/16): LDL 75, HDL 56  ECG: NSR, LBBB  PMH: 1. Diabetic gastroparesis 2. Type II diabetes 3. HTN 4. Morbid obesity 5. CAD: s/p CABG in 12/11 after prior PCIs.  LIMA-LAD, SVG-D, seq SVG-OM1 and OM2, SVG-PDA. Adenosine Cardiolite (8/14) with EF 53% and a small reversible apical defect with a medium, partially reversible inferior defect.  LHC (9/14) with patent SVG-D, LIMA-LAD (50% distal LAD), and sequential SVG-OM branches; the SVG-RCA and the native RCA were occluded.  This was managed medically initially, but with ongoing exertional chest pain, it was decided to open CTO.  Patient had CTO opening with 4 overlapping Xience DES in the RCA in 5/15.  .  6. Atypical migraines 7. OSA: Intolerant of CPAP.  8. GERD with hiatal hernia 9. OA 10. Diastolic CHF: Echo (A999333) with EF 50-55%, grade II diastolic dysfunction, mild-moderate MR.  Echo (8/14) with EF 55-60%, grade II diastolic dysfunction, mildly increased aortic valve gradient (mean 12 mmHg) but valve opens well, mild MR and mild RV dilation.  Echo (9/15) with EF 60-65%, grade II diastolic dysfunction, mild aortic stenosis, mild mitral stenosis, mild to moderate mitral regurgitation, mildly dilated RV with normal systolic function, PA systolic pressure 46 mmHg.  11. CKD 12. Chronic LBBB 13. Anxiety 14. Carotid stenosis: Followed by [facility], [id number] LICA stenosis [id number].  She had left carotid stent in 2/15.  Carotid dopplers 8/15 with no significant disease. Carotid dopplers (4/16): < 40% RICA stenosis.  15. Cerebrovascular disease: CVA 2/14 with right posterior cerebral artery territory ischemic infarction. Cerebral angiogram in 6/14 showed 70% right vertebral ostial stenosis, 123456 LICA stenosis, > [id number] proximal left posterior cerebral artery stenosis, posterior communicating artery aneurysm.  Patient has had episodes of transient expressive aphasia.  Carotid dopplers ([id number]) showed XX123456 LICA stenosis. She had a left carotid stent 2/15.  Possible CVA in 7/15.  16. Positional vertigo (suspected) 17. Palpitations: Holter (6/15) with rare PVCs/PACs.  18. Dyspnea with Brilinta. 19. Melanoma: On face, s/p excision.   20. Aortic stenosis: Mild.  21. Holter (8/16) with no significant arrhythmias.  22. Lower extremity arterial dopplers (9/16) were normal.   SH: Married, nonsmoker  FH: CAD  ROS: All systems reviewed and negative except as per HPI.   Current Outpatient Prescriptions  Medication Sig Dispense Refill  . acetaminophen (TYLENOL) 325 MG tablet Take 650 mg by mouth every 6 (six) hours as needed for mild pain.     Marland Kitchen albuterol (PROVENTIL) (2.5 MG/3ML) 0.083% nebulizer solution Take 2.5 mg by nebulization every 6 (six) hours as needed for shortness of breath (when asthma is active).     Marland Kitchen albuterol (PROVENTIL,VENTOLIN) 90 MCG/ACT inhaler Inhale 2 puffs into the lungs every 6 (six) hours as needed for shortness of breath.     . Alirocumab (PRALUENT) 75 MG/ML SOSY Inject 75 mg into the skin every 14 (fourteen) days. 2 Syringe 11  . ALPRAZolam (XANAX) 0.5 MG tablet Take 0.5 mg by mouth 3 (three) times daily as needed for anxiety. For anxiety - Patient takes 1 in the morning, then 1 in the evening, 2 at bedtime    . amLODipine (NORVASC) 10 MG tablet Take 10 mg by mouth daily.    Marland Kitchen aspirin EC 81 MG tablet Take 162 mg by mouth daily.    Marland Kitchen b complex vitamins capsule Take 1 capsule by mouth daily.    .  Calcium Carbonate 1500 (600 CA) MG TABS Take 1,500 mg by mouth daily with breakfast.    . Cholecalciferol (VITAMIN D) 2000 UNITS tablet Take 2,000 Units by mouth daily with breakfast.     . cholestyramine (QUESTRAN) 4 GM/DOSE powder Take 2 g by mouth 2 (two) times daily with a meal. (mixed in juice or light kool-aid).    . cloNIDine (CATAPRES) 0.2 MG tablet Take 0.2 mg by mouth 2 (two) times daily.    . clopidogrel (PLAVIX) 75 MG tablet Take 1 tablet (75 mg total) by mouth daily. 90 tablet 2  . DULoxetine (CYMBALTA) 60 MG capsule Take 60 mg by mouth daily with breakfast.    . ezetimibe (ZETIA) 10 MG tablet Take 10 mg by mouth at bedtime.    . Fluticasone-Salmeterol (ADVAIR DISKUS) 250-50 MCG/DOSE AEPB Inhale 1 puff into the lungs  every 12 (twelve) hours.     . gabapentin (NEURONTIN) 600 MG tablet Take 300-600 mg by mouth 3 (three) times daily. 1 tablet AM, 1 tablet at noon and 1 1/2 tablet at bedtime.    Marland Kitchen HUMALOG KWIKPEN 100 UNIT/ML SOPN Inject 10-14 Units into the skin 4 (four) times daily -  with meals and at bedtime. 10 units at every meal and 14 units at bedtime if needed    . hydrALAZINE (APRESOLINE) 50 MG tablet Take 50 mg by mouth 3 (three) times daily as needed (for high blood pressure). If BP is >175    . HYDROcodone-acetaminophen (NORCO/VICODIN) 5-325 MG per tablet Take 1 tablet by mouth at bedtime as needed for moderate pain.     Marland Kitchen ipratropium-albuterol (DUONEB) 0.5-2.5 (3) MG/3ML SOLN Take 3 mLs by nebulization as needed (If severe asthma , use every 4 hours for 10 days).     Marland Kitchen LANTUS SOLOSTAR 100 UNIT/ML Solostar Pen 44 Units daily. Increase/decrease 4 units sugar changes.    Marland Kitchen loperamide (IMODIUM) 2 MG capsule Take 2 mg by mouth as needed for diarrhea or loose stools.    Marland Kitchen losartan (COZAAR) 100 MG tablet Take 100 mg by mouth daily with breakfast.     . Magnesium Oxide 250 MG TABS Take 250 mg by mouth 2 (two) times daily.     . meclizine (ANTIVERT) 12.5 MG tablet Take 12.5 mg by mouth  3 (three) times daily as needed for dizziness.    . metoprolol succinate (TOPROL XL) 25 MG 24 hr tablet Take 1 tablet (25 mg total) by mouth 2 (two) times daily. 60 tablet 6  . Multiple Vitamin (MULTIVITAMIN) tablet Take 1 tablet by mouth at bedtime.     . nitroGLYCERIN (NITROSTAT) 0.3 MG SL tablet Place 1 tablet (0.3 mg total) under the tongue every 5 (five) minutes x 3 doses as needed for chest pain. 90 tablet 1  . ondansetron (ZOFRAN) 4 MG tablet Take 1 tablet (4 mg total) by mouth 2 (two) times daily as needed. For nausea 20 tablet 0  . pantoprazole (PROTONIX) 40 MG tablet Take 1 tablet (40 mg total) by mouth 2 (two) times daily. 180 tablet 3  . Probiotic Product (PROBIOTIC DAILY PO) Take 1 tablet by mouth 2 (two) times daily.    . ranolazine (RANEXA) 1000 MG SR tablet TAKE ONE (1) TABLET BY MOUTH TWO (2) TIMES DAILY 60 each 6  . rosuvastatin (CRESTOR) 40 MG tablet Take 40 mg by mouth at bedtime.    Marland Kitchen zinc gluconate 50 MG tablet Take 50 mg by mouth at bedtime.     Marland Kitchen zolpidem (AMBIEN) 10 MG tablet Take 10 mg by mouth at bedtime as needed for sleep.     . furosemide (LASIX) 40 MG tablet 1 and 1/2 tablets (60mg ) by mouth two times a day 270 tablet 1   No current facility-administered medications for this visit.    BP 130/80 mmHg  Pulse 64  Ht 5' 2.5" (1.588 m)  Wt 286 lb 1.9 oz (129.783 kg)  BMI 51.47 kg/m2 General: NAD, obese Neck: JVP 8 cm, no thyromegaly or thyroid nodule.  Lungs: Occasional squeaks noted on lung exam.  CV: Nondisplaced PMI.  Heart regular S1/S2 with wide S2 split, no S3/S4, 1/6 early SEM.  2+ edema 1/2 up lower legs bilaterally.  Slight right carotid bruit.  Normal pedal pulses.  Abdomen: Soft, nontender, no hepatosplenomegaly, no distention.  Skin: Intact without lesions or rashes.  Neurologic: Alert and  oriented x 3.  Psych: Normal affect. Extremities: No clubbing or cyanosis.   Assessment/Plan: 1. CAD: Occluded native RCA and SVG-RCA.  Now status post opening  of CTO RCA with 4 overlapping Xience DES.  Currently, she is not having any significant chest pain.   - Continue ASA, statin, losartan, Crestor, amlodipine, metoprolol, and ranolazine.  - She will continue Plavix long-term.   - She has not tolerated Imdur in the past.  2. Chronic diastolic CHF: Mild volume overloaded on exam and weight is up.  Somewhat worsened NYHA class III symptoms. - Increase Lasix to 60 mg bid.  BMET/BNP today and repeat in 2 wks.      3. Hyperlipidemia: She remains on Zetia, Crestor and Praluent.  Excellent lipids in 10/16.   4. Hypertension: BP is controlled. She is lightheaded with standing and has orthostatic hypotension.  I will have her stop triamterene/HCTZ for now.   5. Cerebrovascular disease: She has history of CVA and had left carotid stent in 2/15. Followed by [facility].  6. Palpitations: Improved on higher Toprol XL.   7. Obesity: She is now seeing a nutritionist.   Followup in 3 months.    Loralie Champagne 02/15/2015

## 2015-02-17 ENCOUNTER — Ambulatory Visit: Payer: Medicare Other | Admitting: Rehabilitative and Restorative Service Providers"

## 2015-02-17 ENCOUNTER — Encounter: Payer: Self-pay | Admitting: Rehabilitative and Restorative Service Providers"

## 2015-02-17 VITALS — BP 129/61 | HR 51

## 2015-02-17 DIAGNOSIS — R293 Abnormal posture: Secondary | ICD-10-CM

## 2015-02-17 DIAGNOSIS — M6281 Muscle weakness (generalized): Secondary | ICD-10-CM | POA: Diagnosis not present

## 2015-02-17 DIAGNOSIS — M542 Cervicalgia: Secondary | ICD-10-CM

## 2015-02-17 NOTE — Therapy (Signed)
[facility] [address] [place], [place], [address] Phone: [phone]   Fax:  [phone]  Physical Therapy Treatment  Patient Details  Name: [name] [name] [name]: [id number] Date of Birth: 02-Jul-1945 Referring Provider: Lenor Coffin, MD  Encounter Date: 02/17/2015      PT End of Session - 02/17/15 1225    Visit Number 17   Number of Visits 17   Date for PT Re-Evaluation 02/28/15   Authorization Type G code every 10th visit   PT Start Time 1155   PT Stop Time 1225   PT Time Calculation (min) 30 min   Equipment Utilized During Treatment Gait belt   Activity Tolerance Patient tolerated treatment well;Other (comment)  did have increased dizziness/nausea with some activities, decreased with rest   Behavior During Therapy [facility] for tasks assessed/performed      Past Medical History  Diagnosis Date  . Vomiting     persistent  . Asthma   . Ventral hernia     hx of  . GERD (gastroesophageal reflux disease)   . Obesity 01-2010  . Hyperlipidemia   . Hypertension   . Irregular heart beat   . Coronary artery disease   . Heart murmur   . Anxiety   . PONV (postoperative nausea and vomiting)   . Peripheral vascular disease (HCC)     ? numbness, tingling arms and legs  . Anemia     hx  . H/O hiatal hernia   . Neuromuscular disorder ([place])     ?  Marland Kitchen Dysrhythmia   . Mental disorder     hx of confusion  . CHF (congestive heart failure) ([place])   . Anginal pain ([place])   . Pneumonia 2000's    "once"  . Chronic bronchitis ([place])     "off and on all the time" (07/18/2013)  . Claustrophobia   . Type II diabetes mellitus ([place])   . Chronic lower back pain   . Other and unspecified angina pectoris   . Obstructive sleep apnea     "can't wear machine; I have claustrophobia" (07/18/2013)  . Osteoarthritis     "knees and hands" (07/18/2013)  . Bundle branch block, left     chronic/notes 07/18/2013  . Vomiting blood   .  Myocardial infarction ([place])     04/1999, 02/2000, 01/2005; 2011; 2014  . Shortness of breath     with exertion  . Chronic kidney disease     frequency, sses dr Jamal Maes every 4 to 6 months  . Migraine     "used to have them really bad; kind of eased up now; down to 1 q couple months" (07/18/2013)  . Headache     "probably 4 days/wk" (07/18/2013)  . Leg cramps     both legs at times  . Stroke ([place]) 03/22/12    right side brain; denies residual on 07/18/2013)  . Stroke [facility]) Oct. 2015    Affected pt.s balance  . Cancer [facility] April 2016    BCC,  Melanoma  . Common migraine 05/14/2014  . Abnormality of gait 05/14/2014  . Memory change 05/14/2014    Past Surgical History  Procedure Laterality Date  . Tumor removal  1970's    ovarian  . Tibia fracture surgery Right 1970's    rods and pins  . Gastric bypass  1984    "stapeling"  . Total abdominal hysterectomy  1970's    w/ appendectomy  . Ankle fracture surgery Left  1970's  . Fracture surgery    . Esophagogastroduodenoscopy  08/03/2011    Procedure: ESOPHAGOGASTRODUODENOSCOPY (EGD);  Surgeon: Shann Medal, MD;  Location: [facility];  Service: General;  Laterality: N/A;  . Gall stone removal  05/2003  . Root canal  10/2000  . Appendectomy  1970's    w/hysterectomy  . Coronary angioplasty with stent placement  01,02,05,06,07,08,11; 04/24/2013    "I've probably got ~ 10 stents by now" (04/24/2013)  . Coronary angioplasty with stent placement  06/13/2013    "got 4 stents today" (06/13/2013)  . Cardiac catheterization  10/10/2012    Dr [name].  . Carotid endarterectomy Left 03/2013  . Cerebral angiogram N/A 04/05/2011    Procedure: CEREBRAL ANGIOGRAM;  Surgeon: Angelia Mould, MD;  Location: [facility] CATH LAB;  Service: Cardiovascular;  Laterality: N/A;  . Carotid stent insertion Left 03/20/2013    Procedure: CAROTID STENT INSERTION;  Surgeon: [name], MD;  Location: [facility] CATH LAB;  Service: Cardiovascular;  Laterality: Left;   internal carotid  . Percutaneous stent intervention N/A 04/24/2013    Procedure: PERCUTANEOUS STENT INTERVENTION;  Surgeon: [name], MD;  Location: [facility] CATH LAB;  Service: Cardiovascular;  Laterality: N/A;  . Percutaneous coronary stent intervention (pci-s) N/A 06/13/2013    Procedure: PERCUTANEOUS CORONARY STENT INTERVENTION (PCI-S);  Surgeon: [name], MD;  Location: [facility] CATH LAB;  Service: Cardiovascular;  Laterality: N/A;  . Coronary artery bypass graft  1220/11    "CABG X5"  . Cholecystectomy  2004  . Hernia repair  2004    "in my stomach; had OR on it twice", wire mesh on 1 hernia  . Esophagogastroduodenoscopy (egd) with propofol N/A 03/11/2014    Procedure: ESOPHAGOGASTRODUODENOSCOPY (EGD) WITH PROPOFOL;  Surgeon: Lafayette Dragon, MD;  Location: [facility] ENDOSCOPY;  Service: Endoscopy;  Laterality: N/A;  . Tooth extraction      Filed Vitals:   02/17/15 1157  BP: 129/61  Pulse: 51    Visit Diagnosis:  Generalized muscle weakness  Postural instability  Neck pain      Subjective Assessment - 02/17/15 1157    Subjective The patient awoke this morning with dizziness and reports intermittent days of aggravated symtpoms.  She notes she has continued numbness in her upper extremity.  The patient reports therapy is helping some for neck motion.  She still has variable days for arm numbness and dizziness.   The patient rates dizziness a 7/10 at rest.  The patient reports headache associated with dizziness and that dizziness will not go away today until she goes to bed and wakes up tomorrow.    Patient Stated Goals Address neck pain and arm numbness   Currently in Pain? Yes   Pain Score 7    Pain Location Neck   Pain Orientation Right;Left   Pain Descriptors / Indicators Aching;Sore;Numbness   Pain Type Chronic pain   Pain Radiating Towards radiates into shoulders and is in jaws (both sides)  took blood pressure as jaw pain has not been reported in past   Pain Onset More  than a month ago   Pain Frequency Constant      Gait: Ambulation working on 3 minute walk test, patient unable to perform today. She had multiple losses of balance requiring min A to recover.  She reported upon entering that it is a bad day and that she is very dizzy today and doesn't feel well.  SELF CARE/HOME MANAGEMENT: Discussed continuing neck exercises and patient reports they are going  well. Discussed gym routine and recommended no treadmill (she tried for a couple of minutes and had dizziness) due to variability in symptoms and concern for safety. Discussed exercise in the pool near a wall for support, seated stepper, arm bike as starting points and gradually progressing. Neck disability index=50%.      PT Education - 02/17/15 1225    Education provided Yes   Education Details Discussed community exercise program and continuing to progress activities after discharge.   Person(s) Educated Patient   Methods Explanation;Demonstration;Handout   Comprehension Verbalized understanding;Returned demonstration          PT Short Term Goals - 02/17/15 1204    PT SHORT TERM GOAL #1   Title The patient will be independent with HEP for postural flexibility, neck stretching, and postural stabilization.   Baseline 01/06/2015: Pt. able to verbalize / demo independence with HEP.     Time 4   Period Weeks   Status Achieved   PT SHORT TERM GOAL #2   Title The patient will improve neck ROM flexion/extension from 8 degrees to > or equal to 18 degrees.   Baseline 01/06/2015: Pt. able to improve neck ROM flexion/extension to > 21 degrees.     Time 4   Period Weeks   Status Achieved   PT SHORT TERM GOAL #3   Title The aptient will improve R grip from 10 lbs to > or equal to 15 lbs. and L grip from 5 lbs to > or equal to 10 lbs.   Baseline 01/06/2015: Pt. able to improve R grip to 17 lbs and L grip to 10 lbs.     Time 4   Period Weeks   Status Achieved   PT SHORT TERM GOAL #4   Title The  patient will report pain < or equal to 3/10 at rest.   Baseline Patient's pain varies and can still be as high as 7/10.   Time 4   Period Weeks   Status Not Met           PT Long Term Goals - 02/17/15 1205    PT LONG TERM GOAL #1   Title The patient will  improve neck disability index by > or equal to 15% (from 54% baseline).   Baseline Modified target date to 02/28/2015   Time 4   Period Weeks   Status Revised   PT LONG TERM GOAL #2   Title The patient will imrpove neck rotation R from 34 deg to 45 deg and L from 22 deg to 40 deg.   Baseline Patient met with R rotation at 52 deg and L rotation at 50 deg.   Time 6   Period Weeks   Status Achieved   PT LONG TERM GOAL #3   Title The patient will improve shoulder A/ROM to > or equal to 140 degrees bilaterally in seated position.   Baseline Patient has shoulder flexion > or equal to 150 degrees.   Time 6   Period Weeks   Status Achieved   PT LONG TERM GOAL #4   Title The patient will be able to reach overhead to fix hair without reports of arms going numb.   Baseline Patient reports continued numbness when fixing her hair and reaching overhead.   Time 4   Period Weeks   Status Not Met   PT LONG TERM GOAL #5   Title The patient will increase gait speed from 1.71 ft/sec to > or equal to 2.4 ft/sec  to demo improved tolerance to movement.   Baseline patient guarded today and needed to hold wall due to dizziness.   Time 4   Period Weeks   Status Not Met   PT LONG TERM GOAL #6   Title The patient will walk for 3 minutes nonstop to demo improved endurance.   Baseline Target date 02/28/2015 (patient walked x 115 feet and needed to rest)   Time 4   Period Weeks   Status Not Met   PT LONG TERM GOAL #7   Title The patient will verbalize understanding of community program for exericse post d/c.   Baseline patient and PT have discussed options of gym and she is a member and she has done seated stepper,    Time 4   Period Weeks    Status Achieved               Plan - 02/17/15 1226    Clinical Impression Statement The patient has variable performance depending on the day.  She continues to experience entire days in which she wakes with dizziness and can have associated headaches. This symtpom has not demonstrated consistent change with exercises.  PT recommended the patient focus on continuing her neck exercises and gym routine (discussed safe activities) for post d/c.   PT Next Visit Plan Discharge today.   Consulted and Agree with Plan of Care Patient        Problem List Patient Active Problem List   Diagnosis Date Noted  . Common migraine 05/14/2014  . Abnormality of gait 05/14/2014  . Memory change 05/14/2014  . Aneurysm, cerebral, nonruptured 05/14/2014  . Cramp of limb-Left neck 05/10/2014  . Hematemesis with nausea   . Vomiting blood   . Dizziness and giddiness 09/21/2013  . Atypical chest pain 07/18/2013  . Unstable angina ([place]) 04/09/2013  . Carotid artery stenosis, symptomatic 03/20/2013  . Chronic diastolic CHF (congestive heart failure) ([place]) 09/23/2012  . Cerebrovascular disease 07/10/2012  . Cerebral artery occlusion with cerebral infarction ([place]) 07/10/2012  . Mitral regurgitation 04/15/2012  . TIA (transient ischemic attack) 04/15/2012  . Occlusion and stenosis of carotid artery without mention of cerebral infarction 08/18/2011  . Bariatric surgery status 06/17/2011  . Speech abnormality 03/22/2011  . Dyspnea 02/24/2011  . PAPILLARY MUSCLE DYSFUNCTION, NON-RHEUMATIC 10/09/2008  . UNSPECIFIED VITAMIN D DEFICIENCY 10/24/2007  . MYOCARDIAL INFARCTION, HX OF 10/24/2007  . PERSISTENT VOMITING 10/24/2007  . OSTEOARTHRITIS 10/24/2007  . MIGRAINES, HX OF 10/24/2007  . DM 01/16/2007  . Hyperlipemia 01/16/2007  . Obesity-post failed open gastroplasty 1984  01/16/2007  . OBSTRUCTIVE SLEEP APNEA 01/16/2007  . Essential hypertension 01/16/2007  . Coronary atherosclerosis 01/16/2007  .  ASTHMA 01/16/2007  . GERD 01/16/2007  . VENTRAL HERNIA 01/16/2007    Hiliary Osorto, PT 02/17/2015, 12:28 PM  [facility] [address] [place], [place], [address] Phone: [phone]   Fax:  [phone]  Name: MAISYN NOURI [name]: [id number] Date of Birth: [birth year]

## 2015-02-17 NOTE — Therapy (Signed)
[facility] [address] [place], [place], [address] Phone: [phone]   Fax:  [phone]  Patient Details  Name: [name] [name] [name]: [id number] Date of Birth: Jun 27, 1945 Referring Provider:  No ref. provider found  Encounter Date: 02/17/2015  PHYSICAL THERAPY DISCHARGE SUMMARY  Visits from Start of Care: 17  Current functional level related to goals / functional outcomes:     PT Short Term Goals - 02/17/15 1204    PT SHORT TERM GOAL #1   Title The patient will be independent with HEP for postural flexibility, neck stretching, and postural stabilization.   Baseline 01/06/2015: Pt. able to verbalize / demo independence with HEP.     Time 4   Period Weeks   Status Achieved   PT SHORT TERM GOAL #2   Title The patient will improve neck ROM flexion/extension from 8 degrees to > or equal to 18 degrees.   Baseline 01/06/2015: Pt. able to improve neck ROM flexion/extension to > 21 degrees.     Time 4   Period Weeks   Status Achieved   PT SHORT TERM GOAL #3   Title The aptient will improve R grip from 10 lbs to > or equal to 15 lbs. and L grip from 5 lbs to > or equal to 10 lbs.   Baseline 01/06/2015: Pt. able to improve R grip to 17 lbs and L grip to 10 lbs.     Time 4   Period Weeks   Status Achieved   PT SHORT TERM GOAL #4   Title The patient will report pain < or equal to 3/10 at rest.   Baseline Patient's pain varies and can still be as high as 7/10.   Time 4   Period Weeks   Status Not Met         PT Long Term Goals - 02/17/15 1205    PT LONG TERM GOAL #1   Title The patient will  improve neck disability index by > or equal to 15% (from 54% baseline).   Baseline Modified target date to 02/28/2015   Time 4   Period Weeks   Status Revised   PT LONG TERM GOAL #2   Title The patient will imrpove neck rotation R from 34 deg to 45 deg and L from 22 deg to 40 deg.   Baseline Patient met with R rotation at 52 deg  and L rotation at 50 deg.   Time 6   Period Weeks   Status Achieved   PT LONG TERM GOAL #3   Title The patient will improve shoulder A/ROM to > or equal to 140 degrees bilaterally in seated position.   Baseline Patient has shoulder flexion > or equal to 150 degrees.   Time 6   Period Weeks   Status Achieved   PT LONG TERM GOAL #4   Title The patient will be able to reach overhead to fix hair without reports of arms going numb.   Baseline Patient reports continued numbness when fixing her hair and reaching overhead.   Time 4   Period Weeks   Status Not Met   PT LONG TERM GOAL #5   Title The patient will increase gait speed from 1.71 ft/sec to > or equal to 2.4 ft/sec to demo improved tolerance to movement.   Baseline patient guarded today and needed to hold wall due to dizziness.   Time 4   Period Weeks   Status Not Met  PT LONG TERM GOAL #6   Title The patient will walk for 3 minutes nonstop to demo improved endurance.   Baseline Target date 02/28/2015 (patient walked x 115 feet and needed to rest)   Time 4   Period Weeks   Status Not Met   PT LONG TERM GOAL #7   Title The patient will verbalize understanding of community program for exericse post d/c.   Baseline patient and PT have discussed options of gym and she is a member and she has done seated stepper,    Time 4   Period Weeks   Status Achieved        Remaining deficits: Chronic neck pain with PT educating on self mgmt of symptoms.   Education / Equipment: HEP, gym program for wellness, self mgmt of chronic symptoms.  Plan: Patient agrees to discharge.  Patient goals were partially met. Patient is being discharged due to meeting the stated rehab goals.  ????? and feeling that other symptoms are not improving with further therapy.        Thank you for the referral of this patient. [name] [name], MPT   [name] [name] 02/17/2015, 12:56 PM  [facility] [address] [place], [place], [address] Phone: [phone]   Fax:  [phone]

## 2015-02-19 ENCOUNTER — Encounter: Payer: Self-pay | Admitting: Cardiology

## 2015-02-19 ENCOUNTER — Telehealth: Payer: Self-pay | Admitting: Internal Medicine

## 2015-02-19 LAB — BRAIN NATRIURETIC PEPTIDE

## 2015-02-19 NOTE — Telephone Encounter (Signed)
Appointment scheduled. Lauren states that she will inform patient of appointment.

## 2015-02-19 NOTE — Telephone Encounter (Signed)
Sure

## 2015-02-21 ENCOUNTER — Other Ambulatory Visit: Payer: Medicare Other | Admitting: *Deleted

## 2015-02-21 DIAGNOSIS — I10 Essential (primary) hypertension: Secondary | ICD-10-CM

## 2015-02-22 LAB — BRAIN NATRIURETIC PEPTIDE: Brain Natriuretic Peptide: 49.7 pg/mL (ref 0.0–100.0)

## 2015-02-24 ENCOUNTER — Ambulatory Visit: Payer: Medicare Other | Admitting: Rehabilitative and Restorative Service Providers"

## 2015-02-28 ENCOUNTER — Other Ambulatory Visit: Payer: Medicare Other

## 2015-03-11 ENCOUNTER — Encounter: Payer: Medicare Other | Attending: Cardiology | Admitting: Dietician

## 2015-03-11 ENCOUNTER — Other Ambulatory Visit (INDEPENDENT_AMBULATORY_CARE_PROVIDER_SITE_OTHER): Payer: Medicare Other | Admitting: *Deleted

## 2015-03-11 VITALS — Wt 287.0 lb

## 2015-03-11 DIAGNOSIS — I340 Nonrheumatic mitral (valve) insufficiency: Secondary | ICD-10-CM

## 2015-03-11 DIAGNOSIS — Z713 Dietary counseling and surveillance: Secondary | ICD-10-CM | POA: Insufficient documentation

## 2015-03-11 DIAGNOSIS — I5032 Chronic diastolic (congestive) heart failure: Secondary | ICD-10-CM

## 2015-03-11 DIAGNOSIS — E118 Type 2 diabetes mellitus with unspecified complications: Secondary | ICD-10-CM | POA: Diagnosis not present

## 2015-03-11 DIAGNOSIS — Z794 Long term (current) use of insulin: Secondary | ICD-10-CM | POA: Insufficient documentation

## 2015-03-11 DIAGNOSIS — Z6841 Body Mass Index (BMI) 40.0 and over, adult: Secondary | ICD-10-CM | POA: Insufficient documentation

## 2015-03-11 DIAGNOSIS — E785 Hyperlipidemia, unspecified: Secondary | ICD-10-CM

## 2015-03-11 DIAGNOSIS — I2510 Atherosclerotic heart disease of native coronary artery without angina pectoris: Secondary | ICD-10-CM

## 2015-03-11 DIAGNOSIS — I6523 Occlusion and stenosis of bilateral carotid arteries: Secondary | ICD-10-CM

## 2015-03-11 LAB — BASIC METABOLIC PANEL
BUN: 21 mg/dL (ref 7–25)
CO2: 24 mmol/L (ref 20–31)
Calcium: 9 mg/dL (ref 8.6–10.4)
Chloride: 100 mmol/L (ref 98–110)
Creat: 1.07 mg/dL — ABNORMAL HIGH (ref 0.50–0.99)
Glucose, Bld: 154 mg/dL — ABNORMAL HIGH (ref 65–99)
Potassium: 5.2 mmol/L (ref 3.5–5.3)
Sodium: 138 mmol/L (ref 135–146)

## 2015-03-11 NOTE — Patient Instructions (Signed)
Consider counseling. Increase your fluid intake.  Aim for 64 ounces per day (mostly water). Have several small meals daily.  Aim to eat every 2-3 hours.  Consider using a small plate Have small amount of protein each time you eat. (greek yogurt, milk, cheese, meat, beans) Carnation Breakfast Essentials, sugar free made with the Lactaid milk [employer]- near oatmeal) or Boost glucose control or Glucerna. Steamed vegetables rather than raw. Consider avoiding added salt Consider reevaluating the sleep apnea.

## 2015-03-11 NOTE — Progress Notes (Signed)
Medical Nutrition Therapy:  Appt start time: [id number]  end time:  [id number].   Assessment:  Primary concerns today: Patient is here today for a follow up.  She would like to lose weight.  She states that "she hates herself" and that weight is a big part of this.   Type 2 diagnosed in 2001. Other hx includes stage 3 CKD (potassium 5.1 and GFR 43.8 10/23/14- she is followed by Dr. Lorrene Reid), CVA 2013 and 2015, MI 2001, CHF, and multiple stents. Hx also includes Gastric bypass surgery "that did not work". She reports severe vomiting at times and has slow emptying. Hernia surgery in past but feels that it has returned. No appetite and eats no breakfast. From her history, intake is not excessive. She follows a low sodium diet.  She reported that one day she did not eat or drink until 5:00pm.  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  She has made some changes in the past month, trying romaine, which she enjoys as well as continuing to incorporate salmon, tuna, and dried beans in her diet.  She has also enjoyed the [employer] seasoning products.She has been going to rehab and this has reduced her pain and they are working to improve her walking.  They asked that she go to the gym a couple of days per week in addition to rehab and she is considering using the pool there.  She wants her husband to take time off work to go with her.  She has gained a couple lbs since last visit.  Still complains of increased fluid gain/swelling.  Further discussion of her diet reveals use of regular bullion cube to season.  Discussed further need to avoid any added sodium as well as importance of improving her fluid intake and nutrition.  She states that she feels that she needs to see a Social worker.  She states that MD stated that she does not need to take the Metformin or Vitamin D at this time.  Current labs unavailable for  this visit.  Patient is here alone. She is retired and lives with her husband. Weight has always been a struggle. Lowest of 200-225 until 2001, 250 lbs UBW until 6-8 months ago then became ill, was on IV antibiotics and very poor energy for 4 moths and was unable to exercise and is at 283 lbs today. She is frustrated by her continued weight gain, has a poor body image, and depression due to health and other underlying causes. We discussed this. She is a member of a sports center 3 miles from her home but does not go.   03/11/15: Patient is here alone for nutrition follow up of diabetes and weight.  She continues to skip meals and reported only 3 cups of fluid intake yesterday.  Her weight has increased 2 lbs since last visit but this may also be fluid.  Her protein intake is inadequate often.  She continues to state that she hates herself and her body.   She states that her urine is very dark.  MD increased lasix but she is not urinating much.  She reports sleeping only 2 hours per night despite xanax and Ambien.   She has OSA but states that she cannot tolerate the C-pap mask.  She is having difficulty also dealing with decreasing health with aging.    Preferred Learning Style:   No  preference indicated   Learning Readiness:   Ready  Change in progress  MEDICATIONS: see list   DIETARY INTAKE:  Usual eating pattern includes 1-2 meals and 1-2 snacks per day. Avoided foods include Many vegetables taste bitter to her.    24-hr recall:  B ( AM): often skips  Snk ( AM): often skips  L ( PM): salad with tuna Snk ( PM): nuts D ( PM): taco with lean beef/Mrs. Dash taco seasoning Snk ( PM):  Beverages: coffee, tea "hates water"  Usual physical activity: current physical rehab  Estimated energy needs: 1200 calories 135 g carbohydrates 90 g protein 33 g fat  Progress Towards Goal(s):  In progress.   Nutritional Diagnosis:  NB-1.1 Food and nutrition-related knowledge deficit As  related to balance of energy intake/expenditure,sodium needs, and basic nutrition.  As evidenced by patient report and weight gain..    Intervention:  Nutrition counseling/education continued for body acceptance, low sodium guidelines, and importance of maintaining nutrition and hydration.  We discussed finding a counselor further.  Discussed that inadequate protein intake can cause a decrease of nutritional status that can increase water weight gain.  Discussed importance of increasing hydration for her kidney's but if concerns of fluid retention to discuss with her MD.  Consider counseling. Increase your fluid intake.  Aim for 64 ounces per day (mostly water). Have several small meals daily.  Aim to eat every 2-3 hours.  Consider using a small plate Have small amount of protein each time you eat. (greek yogurt, milk, cheese, meat, beans) Carnation Breakfast Essentials, sugar free made with the Lactaid milk [employer]- near oatmeal) or Boost glucose control or Glucerna. Steamed vegetables rather than raw. Consider avoiding added salt Consider reevaluating the sleep apnea.  Teaching Method Utilized:  Visual Auditory  Handouts given during visit include:  Barriers to learning/adherence to lifestyle change: physical challenges and depression.  Demonstrated degree of understanding via:  Teach Back   Monitoring/Evaluation:  Dietary intake, exercise, label reading, and body weight in 1 month(s).

## 2015-03-17 ENCOUNTER — Other Ambulatory Visit: Payer: Self-pay | Admitting: Cardiology

## 2015-03-17 ENCOUNTER — Encounter: Payer: Self-pay | Admitting: Cardiology

## 2015-03-17 NOTE — Telephone Encounter (Signed)
Follow up      Returning a call to the nurse to get lab results

## 2015-03-17 NOTE — Telephone Encounter (Signed)
Left message to call back  

## 2015-04-09 ENCOUNTER — Encounter: Payer: Self-pay | Admitting: Internal Medicine

## 2015-04-09 ENCOUNTER — Ambulatory Visit (INDEPENDENT_AMBULATORY_CARE_PROVIDER_SITE_OTHER): Payer: Medicare Other | Admitting: Internal Medicine

## 2015-04-09 VITALS — BP 170/80 | HR 60 | Ht 61.75 in | Wt 279.1 lb

## 2015-04-09 DIAGNOSIS — IMO0001 Reserved for inherently not codable concepts without codable children: Secondary | ICD-10-CM

## 2015-04-09 DIAGNOSIS — K920 Hematemesis: Secondary | ICD-10-CM | POA: Diagnosis not present

## 2015-04-09 DIAGNOSIS — I6523 Occlusion and stenosis of bilateral carotid arteries: Secondary | ICD-10-CM | POA: Diagnosis not present

## 2015-04-09 DIAGNOSIS — R110 Nausea: Secondary | ICD-10-CM

## 2015-04-09 DIAGNOSIS — R143 Flatulence: Secondary | ICD-10-CM | POA: Diagnosis not present

## 2015-04-09 MED ORDER — ONDANSETRON HCL 4 MG PO TABS: 4.0000 mg | ORAL_TABLET | ORAL | Status: DC | PRN

## 2015-04-09 MED ORDER — RIFAXIMIN 550 MG PO TABS: 550.0000 mg | ORAL_TABLET | Freq: Two times a day (BID) | ORAL | Status: DC

## 2015-04-09 NOTE — Progress Notes (Signed)
HISTORY OF PRESENT ILLNESS:  [name] [name] is a very pleasant but highly medically complicated 70 y.o. female  who is referred by Dr. Derinda Late regarding chronic problems with nausea and vomiting. Previous long-standing patient of Dr. Delfin Edis. I have spent greater than 1 hour reviewing her medical record in anticipation of this office visit. Patient reports to me a near 20 year history of problems with nausea, gagging, and vomiting. She is undergone multiple evaluations and multiple empiric therapies without significant change. Her history is pertinent for banded gastroplasty for obesity in 1980. Her medical problems include, but not limited to morbid obesity with a BMI of greater than 51, coronary artery disease, history of heart failure, history of CVA, asthma, obese female is, hypertension, hyperlipidemia, anxiety, and chronic antiplatelet therapy. For her current symptoms she does take Zofran as needed. She tells me his symptoms are generally most prominent when she wakes. Generally gagging or phlegm is produced. Not food. Rare scant hematemesis. She did undergo upper endoscopy 1 year ago revealed normal postoperative anatomy with widely patent surgically created pouch. She was noted to have mild to moderate gastroparesis on gastric emptying scan. She also underwent upper GI series which revealed no obstructive lesion. The patient denies that when she eats, her food gets lies. Again, typically does not vomit food. Despite the chronicity of this problem, she continues to gain weight. She is on twice a day PPI and has been on such for a while. No classic pyrosis. No dysphagia. Her other GI complaint is that of increased intestinal gas. She states this is new. Gas is associated with belching bloating and flatus. She has tendency toward loose bowels, though this has not changed. No bleeding. Outside blood work reviewed with no significant abnormalities identified on CBC or comprehensive metabolic panel.  Last colonoscopy in 2007 was normal.  REVIEW OF SYSTEMS:  All non-GI ROS negative except for sinus and allergy trouble, anxiety, arthritis, back pain, blood in urine, confusion, cough, depression, fatigue, heart murmur, heart rhythm change, muscle cramps, night sweats, sleeping problems, ankle swelling, voice change, shortness of breath  Past Medical History  Diagnosis Date  . Vomiting     persistent  . Asthma   . Ventral hernia     hx of  . GERD (gastroesophageal reflux disease)   . Obesity 01-2010  . Hyperlipidemia   . Hypertension   . Irregular heart beat   . Coronary artery disease   . Heart murmur   . Anxiety   . PONV (postoperative nausea and vomiting)   . Peripheral vascular disease (HCC)     ? numbness, tingling arms and legs  . Anemia     hx  . H/O hiatal hernia   . Neuromuscular disorder ([place])     ?  Marland Kitchen Dysrhythmia   . Mental disorder     hx of confusion  . CHF (congestive heart failure) ([place])   . Anginal pain ([place])   . Pneumonia 2000's    "once"  . Chronic bronchitis ([place])     "off and on all the time" (07/18/2013)  . Claustrophobia   . Type II diabetes mellitus ([place])   . Chronic lower back pain   . Other and unspecified angina pectoris   . Obstructive sleep apnea     "can't wear machine; I have claustrophobia" (07/18/2013)  . Osteoarthritis     "knees and hands" (07/18/2013)  . Bundle branch block, left     chronic/notes 07/18/2013  . Vomiting blood   .  Myocardial infarction ([place])     04/1999, 02/2000, 01/2005; 2011; 2014  . Shortness of breath     with exertion  . Chronic kidney disease     frequency, sses dr Jamal Maes every 4 to 6 months  . Migraine     "used to have them really bad; kind of eased up now; down to 1 q couple months" (07/18/2013)  . Headache     "probably 4 days/wk" (07/18/2013)  . Leg cramps     both legs at times  . Stroke ([place]) 03/22/12    right side brain; denies residual on 07/18/2013)  . Stroke [facility]) Oct. 2015    Affected  pt.s balance  . Cancer [facility]) April 2016    BCC,  Melanoma  . Common migraine 05/14/2014  . Abnormality of gait 05/14/2014  . Memory change 05/14/2014    Past Surgical History  Procedure Laterality Date  . Tumor removal  1970's    ovarian  . Tibia fracture surgery Right 1970's    rods and pins  . Gastric bypass  1984    "stapeling"  . Total abdominal hysterectomy  1970's    w/ appendectomy  . Ankle fracture surgery Left 1970's  . Fracture surgery    . Esophagogastroduodenoscopy  08/03/2011    Procedure: ESOPHAGOGASTRODUODENOSCOPY (EGD);  Surgeon: Shann Medal, MD;  Location: [facility];  Service: General;  Laterality: N/A;  . Gall stone removal  05/2003  . Root canal  10/2000  . Appendectomy  1970's    w/hysterectomy  . Coronary angioplasty with stent placement  01,02,05,06,07,08,11; 04/24/2013    "I've probably got ~ 10 stents by now" (04/24/2013)  . Coronary angioplasty with stent placement  06/13/2013    "got 4 stents today" (06/13/2013)  . Cardiac catheterization  10/10/2012    Dr [name].  . Carotid endarterectomy Left 03/2013  . Cerebral angiogram N/A 04/05/2011    Procedure: CEREBRAL ANGIOGRAM;  Surgeon: Angelia Mould, MD;  Location: [facility] CATH LAB;  Service: Cardiovascular;  Laterality: N/A;  . Carotid stent insertion Left 03/20/2013    Procedure: CAROTID STENT INSERTION;  Surgeon: [name], MD;  Location: [facility] CATH LAB;  Service: Cardiovascular;  Laterality: Left;  internal carotid  . Percutaneous stent intervention N/A 04/24/2013    Procedure: PERCUTANEOUS STENT INTERVENTION;  Surgeon: [name], MD;  Location: [facility] CATH LAB;  Service: Cardiovascular;  Laterality: N/A;  . Percutaneous coronary stent intervention (pci-s) N/A 06/13/2013    Procedure: PERCUTANEOUS CORONARY STENT INTERVENTION (PCI-S);  Surgeon: [name], MD;  Location: [facility] CATH LAB;  Service: Cardiovascular;  Laterality: N/A;  . Coronary artery bypass graft  1220/11    "CABG X5"  .  Cholecystectomy  2004  . Hernia repair  2004    "in my stomach; had OR on it twice", wire mesh on 1 hernia  . Esophagogastroduodenoscopy (egd) with propofol N/A 03/11/2014    Procedure: ESOPHAGOGASTRODUODENOSCOPY (EGD) WITH PROPOFOL;  Surgeon: Lafayette Dragon, MD;  Location: [facility] ENDOSCOPY;  Service: Endoscopy;  Laterality: N/A;  . Tooth extraction      Social History [name] [name]  reports that she has never smoked. She has never used smokeless tobacco. She reports that she does not drink alcohol or use illicit drugs.  family history includes Cancer in her sister; Deep vein thrombosis in her father and mother; Diabetes in her father, paternal aunt, paternal grandmother, paternal uncle, and paternal uncle; Heart attack in her father; Heart attack (age of onset: 67)  in her paternal grandfather; Heart disease in her father and paternal uncle; Hyperlipidemia in her father; Hypertension in her father, mother, and sister. There is no history of Colon cancer.  Allergies  Allergen Reactions  . Amoxicillin Shortness Of Breath and Rash  . Brilinta [Ticagrelor] Shortness Of Breath  . Erythromycin Shortness Of Breath, Other (See Comments) and Hives    Trouble swallowing  . Flagyl [Metronidazole] Palpitations and Shortness Of Breath  . Penicillins Shortness Of Breath, Rash and Hives  . Isosorbide Mononitrate [Isosorbide]     Other reaction(s): Other (See Comments) Joint aches, muscles hurt, difficult to walk Joint aches, muscles hurt, difficult to walk  . Metformin And Related     Stomach pain, cold sweats, joint pain, burred vision, dizziness  . Tape Other (See Comments)    Skin pulls off with certain types Plastic tape causes skin to rip if left on for long periods of time  . Erythromycin Base Rash       PHYSICAL EXAMINATION: Vital signs: BP 170/80 mmHg  Pulse 60  Ht 5' 1.75" (1.568 m)  Wt 279 lb 2 oz (126.61 kg)  BMI 51.50 kg/m2  Constitutional: Markedly obese but generally well-appearing,  no acute distress Psychiatric: alert and oriented x3, cooperative Eyes: extraocular movements intact, anicteric, conjunctiva pink Mouth: oral pharynx moist, no lesions or rashes Neck: supple without obvious thyromegaly Lymph: no lymphadenopathy Cardiovascular: heart regular rate and rhythm, no murmur Lungs: clear to auscultation bilaterally Abdomen: soft, obese, nontender, nondistended, no obvious ascites, no peritoneal signs, normal bowel sounds, no organomegaly Rectal:Omitted Extremities: no clubbing cyanosis or lower extremity edema bilaterally Skin: no lesions on visible extremities Neuro: No focal deficits. Cranial nerves intact. Normal DTRs  ASSESSMENT:  #1. Chronic nausea with regurgitation and gagging. Occasional vomiting. Generally not foodstuff. Has been going on for nearly 20 years. Although there are multiple causes for such symptoms, the problem is most likely functional. She has had a very thorough workup that excludes obstruction from prior surgery. Thus, I do NOT feel that she would benefit from surgical revision of her prior bariatric surgery. She would be very high risk in any event. I have discussed this with her. In addition, her gastroparesis is mild in symptoms are incompatible with gastroparesis derived etiology. Her ongoing weight gain mitigates against significant organic pathology. #2. Increased intestinal gas without alarm features. Nonspecific. Question bacterial overgrowth in a diabetic #3. Multiple medical problems   PLAN:  #1. Recommended a trial of running Zofran 4 mg every 6 hours for 1 week. If this is helpful, she could continue running Zofran in an attempt to identify the low-dose regular dose that is helpful. If not, resume when necessary usage. I have adjusted her prescription and submitted electronically #2. Trial of Xifaxan 550 mg by mouth twice a day 2 weeks for possible bacterial overgrowth. Prescription submitted electronically #3. Weight loss.  This is important as her obesity promotes regurgitation. Obviously, she is aware importance of weight loss #4. GI follow-up as needed A copy of this consultation has been sent to Dr. Sandi Mariscal  Greater than 40 minutes spent with the patient in her evaluation. 50% of the time use for counseling discussing her issues with nausea, regurgitation, vomiting, hematemesis, and increased intestinal gas

## 2015-04-09 NOTE — Patient Instructions (Signed)
We have sent the following medications to your pharmacy for you to pick up at your convenience:  Zofran  I have sent a prescription for Xifaxan through the specialty pharmacy [facility].

## 2015-04-14 ENCOUNTER — Encounter: Payer: Medicare Other | Attending: Cardiology | Admitting: Dietician

## 2015-04-14 ENCOUNTER — Encounter: Payer: Self-pay | Admitting: Dietician

## 2015-04-14 VITALS — Wt 280.0 lb

## 2015-04-14 DIAGNOSIS — E118 Type 2 diabetes mellitus with unspecified complications: Secondary | ICD-10-CM | POA: Insufficient documentation

## 2015-04-14 DIAGNOSIS — Z794 Long term (current) use of insulin: Secondary | ICD-10-CM | POA: Insufficient documentation

## 2015-04-14 DIAGNOSIS — IMO0002 Reserved for concepts with insufficient information to code with codable children: Secondary | ICD-10-CM

## 2015-04-14 DIAGNOSIS — E1165 Type 2 diabetes mellitus with hyperglycemia: Secondary | ICD-10-CM

## 2015-04-14 DIAGNOSIS — Z713 Dietary counseling and surveillance: Secondary | ICD-10-CM | POA: Diagnosis not present

## 2015-04-14 DIAGNOSIS — Z6841 Body Mass Index (BMI) 40.0 and over, adult: Secondary | ICD-10-CM | POA: Insufficient documentation

## 2015-04-14 NOTE — Patient Instructions (Signed)
Drink water first thing when you get up. Eat within one hour of waking Breakfast, lunch, dinner daily Small snacks when hungry. Listen to your body Small amount of protein each time you eat. Aim for 3 things per meal (1/2 sandwich, veges, fruit for example) Inquire about counseling.

## 2015-04-14 NOTE — Progress Notes (Signed)
Medical Nutrition Therapy:  Appt start time: [id number]  end time:  [id number].   Assessment:  Primary concerns today: Patient is here today for a follow up.  She would like to lose weight.  She states that "she hates herself" and that weight is a big part of this.   Type 2 diagnosed in 2001. Other hx includes stage 3 CKD (potassium 5.1 and GFR 43.8 10/23/14- she is followed by Dr. Lorrene Reid), CVA 2013 and 2015, MI 2001, CHF, and multiple stents. Hx also includes Gastric bypass surgery "that did not work". She reports severe vomiting at times and has slow emptying. Hernia surgery in past but feels that it has returned. No appetite and eats no breakfast. From her history, intake is not excessive. She follows a low sodium diet.  She reported that one day she did not eat or drink until 5:00pm.  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  She has made some changes in the past month, trying romaine, which she enjoys as well as continuing to incorporate salmon, tuna, and dried beans in her diet.  She has also enjoyed the Mrs. Dash seasoning products.She has been going to rehab and this has reduced her pain and they are working to improve her walking.  They asked that she go to the gym a couple of days per week in addition to rehab and she is considering using the pool there.  She wants her husband to take time off work to go with her.  She has gained a couple lbs since last visit.  Still complains of increased fluid gain/swelling.  Further discussion of her diet reveals use of regular bullion cube to season.  Discussed further need to avoid any added sodium as well as importance of improving her fluid intake and nutrition.  She states that she feels that she needs to see a Social worker.  She states that MD stated that she does not need to take the Metformin or Vitamin D at this time.  Current labs unavailable for  this visit.  Patient is here alone. She is retired and lives with her husband. Weight has always been a struggle. Lowest of 200-225 until 2001, 250 lbs UBW until 6-8 months ago then became ill, was on IV antibiotics and very poor energy for 4 moths and was unable to exercise and is at 283 lbs today. She is frustrated by her continued weight gain, has a poor body image, and depression due to health and other underlying causes. We discussed this. She is a member of a sports center 3 miles from her home but does not go.   03/11/15: Patient is here alone for nutrition follow up of diabetes and weight.  She continues to skip meals and reported only 3 cups of fluid intake yesterday.  Her weight has increased 2 lbs since last visit but this may also be fluid.  Her protein intake is inadequate often.  She continues to state that she hates herself and her body.   She states that her urine is very dark.  MD increased lasix but she is not urinating much.  She reports sleeping only 2 hours per night despite xanax and Ambien.   She has OSA but states that she cannot tolerate the C-pap mask.  She is having difficulty also dealing with decreasing health with aging.    04/14/15: Patient is here alone for  follow up for nutrition for diabetes and weight.  She is doing better with drinking more fluid and eating small amounts throughout the day but is still not eating significant amounts per patient.  She also gets discouraged and fearful to eat because of her weight.  Today's weight is 280 lbs decreased from 287 lbs at our last visit 03/11/15.  She states that the loss is due to fluid loss.  She is being careful about the sodium content of foods.  She remains depressed and is more open to counseling at this time.  She has started Jardiance for her blood sugar but states that she may not be able to continue this due to "feeling sick and tired due to the side effects of the medications".    Preferred Learning Style:   No  preference indicated   Learning Readiness:   Ready  Change in progress  MEDICATIONS: see list   DIETARY INTAKE:  Usual eating pattern includes 1-2 meals and 1-2 snacks per day. Avoided foods include Many vegetables taste bitter to her.    24-hr recall:  B ( AM): yogurt Snk ( AM): often skips  L ( PM): lite yogurt or raw vegetables or salad with tuna Snk ( PM): nuts D ( PM): taco with lean beef/Mrs. Dash taco seasoning Snk ( PM):  Beverages: coffee, tea "hates water"  Usual physical activity: current physical rehab  Estimated energy needs: 1200 calories 135 g carbohydrates 90 g protein 33 g fat  Progress Towards Goal(s):  In progress.   Nutritional Diagnosis:  NB-1.1 Food and nutrition-related knowledge deficit As related to balance of energy intake/expenditure,sodium needs, and basic nutrition.  As evidenced by patient report and weight gain..    Intervention:  Nutrition counseling/education continued for body acceptance, low sodium guidelines, and importance of maintaining nutrition and hydration.  We discussed finding a counselor further.  Discussed that inadequate protein intake can cause a decrease of nutritional status that can increase water weight gain.  Discussed importance of increasing hydration for her kidney's but if concerns of fluid retention to discuss with her MD.  Drink water first thing when you get up. Eat within one hour of waking Breakfast, lunch, dinner daily Small snacks when hungry. Listen to your body Small amount of protein each time you eat. Aim for 3 things per meal (1/2 sandwich, veges, fruit for example) Inquire about counseling. (She may also need reevaluation for sleep apnea.)  Teaching Method Utilized:  Visual Auditory  Handouts given during visit include: Breakfast ideas, my plate, counseling list again per her request  Barriers to learning/adherence to lifestyle change: physical challenges and depression.  Demonstrated  degree of understanding via:  Teach Back   Monitoring/Evaluation:  Dietary intake, exercise, label reading, and body weight in 1 month(s).

## 2015-05-02 ENCOUNTER — Encounter: Payer: Self-pay | Admitting: Family

## 2015-05-06 ENCOUNTER — Encounter: Payer: Self-pay | Admitting: Neurology

## 2015-05-06 ENCOUNTER — Ambulatory Visit (INDEPENDENT_AMBULATORY_CARE_PROVIDER_SITE_OTHER): Payer: Medicare Other | Admitting: Neurology

## 2015-05-06 VITALS — BP 134/56 | HR 63 | Ht 61.75 in | Wt 289.5 lb

## 2015-05-06 DIAGNOSIS — R269 Unspecified abnormalities of gait and mobility: Secondary | ICD-10-CM | POA: Diagnosis not present

## 2015-05-06 DIAGNOSIS — I671 Cerebral aneurysm, nonruptured: Secondary | ICD-10-CM | POA: Diagnosis not present

## 2015-05-06 DIAGNOSIS — I6523 Occlusion and stenosis of bilateral carotid arteries: Secondary | ICD-10-CM | POA: Diagnosis not present

## 2015-05-06 DIAGNOSIS — G43009 Migraine without aura, not intractable, without status migrainosus: Secondary | ICD-10-CM | POA: Diagnosis not present

## 2015-05-06 DIAGNOSIS — R413 Other amnesia: Secondary | ICD-10-CM | POA: Diagnosis not present

## 2015-05-06 DIAGNOSIS — R42 Dizziness and giddiness: Secondary | ICD-10-CM

## 2015-05-06 DIAGNOSIS — G4700 Insomnia, unspecified: Secondary | ICD-10-CM | POA: Diagnosis not present

## 2015-05-06 DIAGNOSIS — F5104 Psychophysiologic insomnia: Secondary | ICD-10-CM

## 2015-05-06 HISTORY — DX: Psychophysiologic insomnia: F51.04

## 2015-05-06 NOTE — Progress Notes (Signed)
Reason for visit: Dizziness  [name] [name] is an 70 y.o. female  History of present illness:  Ms. Floresgarcia is a 70 year old right-handed white female with a history of morbid obesity and polypharmacy. The patient has a history of cerebrovascular disease with a prior right posterior cerebral artery distribution stroke. The patient reports that she began having dizziness shortly after the stroke. She was found to have a left posterior communicating aneurysm of about 2.5 mm, but follow-up MR angiogram of the head done 1 year ago did not show evidence of an intracranial aneurysm. The patient has chronic renal insufficiency. She continues to report difficulty with gait instability, she feels like she is bearing to the left often times. The patient has not had any recent falls. She underwent some physical therapy which was not beneficial for her balance or for her dizziness. The patient has neck pain and some discomfort coming up from the back of the neck which may be on the left or the right side. The patient underwent some cervical traction, she has had some headaches that occur on the left side now following traction that also include the lower face, some left face numbness. The patient has had some mild memory disturbances due to the stroke. She has ongoing fatigue and insomnia. The patient has been diagnosed with sleep apnea, but she could not tolerate the CPAP. The patient comes back to this office for an evaluation. The dizziness is oftentimes is worse with standing, sometimes associated with visual dimming.  Past Medical History  Diagnosis Date  . Vomiting     persistent  . Asthma   . Ventral hernia     hx of  . GERD (gastroesophageal reflux disease)   . Obesity 01-2010  . Hyperlipidemia   . Hypertension   . Irregular heart beat   . Coronary artery disease   . Heart murmur   . Anxiety   . PONV (postoperative nausea and vomiting)   . Peripheral vascular disease (HCC)     ? numbness,  tingling arms and legs  . Anemia     hx  . H/O hiatal hernia   . Neuromuscular disorder ([place])     ?  Marland Kitchen Dysrhythmia   . Mental disorder     hx of confusion  . CHF (congestive heart failure) ([place])   . Anginal pain ([place])   . Pneumonia 2000's    "once"  . Chronic bronchitis ([place])     "off and on all the time" (07/18/2013)  . Claustrophobia   . Type II diabetes mellitus ([place])   . Chronic lower back pain   . Other and unspecified angina pectoris   . Obstructive sleep apnea     "can't wear machine; I have claustrophobia" (07/18/2013)  . Osteoarthritis     "knees and hands" (07/18/2013)  . Bundle branch block, left     chronic/notes 07/18/2013  . Vomiting blood   . Myocardial infarction ([place])     04/1999, 02/2000, 01/2005; 2011; 2014  . Shortness of breath     with exertion  . Chronic kidney disease     frequency, sses dr Jamal Maes every 4 to 6 months  . Migraine     "used to have them really bad; kind of eased up now; down to 1 q couple months" (07/18/2013)  . Headache     "probably 4 days/wk" (07/18/2013)  . Leg cramps     both legs at times  . Stroke [facility]) 03/22/12  right side brain; denies residual on 07/18/2013)  . Stroke [facility]) Oct. 2015    Affected pt.s balance  . Cancer [facility]) April 2016    BCC,  Melanoma  . Common migraine 05/14/2014  . Abnormality of gait 05/14/2014  . Memory change 05/14/2014  . Chronic insomnia 05/06/2015    Past Surgical History  Procedure Laterality Date  . Tumor removal  1970's    ovarian  . Tibia fracture surgery Right 1970's    rods and pins  . Gastric bypass  1984    "stapeling"  . Total abdominal hysterectomy  1970's    w/ appendectomy  . Ankle fracture surgery Left 1970's  . Fracture surgery    . Esophagogastroduodenoscopy  08/03/2011    Procedure: ESOPHAGOGASTRODUODENOSCOPY (EGD);  Surgeon: Shann Medal, MD;  Location: [facility];  Service: General;  Laterality: N/A;  . Gall stone removal  05/2003  . Root canal  10/2000  .  Appendectomy  1970's    w/hysterectomy  . Coronary angioplasty with stent placement  01,02,05,06,07,08,11; 04/24/2013    "I've probably got ~ 10 stents by now" (04/24/2013)  . Coronary angioplasty with stent placement  06/13/2013    "got 4 stents today" (06/13/2013)  . Cardiac catheterization  10/10/2012    Dr [name].  . Carotid endarterectomy Left 03/2013  . Cerebral angiogram N/A 04/05/2011    Procedure: CEREBRAL ANGIOGRAM;  Surgeon: Angelia Mould, MD;  Location: [facility] CATH LAB;  Service: Cardiovascular;  Laterality: N/A;  . Carotid stent insertion Left 03/20/2013    Procedure: CAROTID STENT INSERTION;  Surgeon: [name], MD;  Location: [facility] CATH LAB;  Service: Cardiovascular;  Laterality: Left;  internal carotid  . Percutaneous stent intervention N/A 04/24/2013    Procedure: PERCUTANEOUS STENT INTERVENTION;  Surgeon: [name], MD;  Location: [facility] CATH LAB;  Service: Cardiovascular;  Laterality: N/A;  . Percutaneous coronary stent intervention (pci-s) N/A 06/13/2013    Procedure: PERCUTANEOUS CORONARY STENT INTERVENTION (PCI-S);  Surgeon: [name], MD;  Location: [facility] CATH LAB;  Service: Cardiovascular;  Laterality: N/A;  . Coronary artery bypass graft  1220/11    "CABG X5"  . Cholecystectomy  2004  . Hernia repair  2004    "in my stomach; had OR on it twice", wire mesh on 1 hernia  . Esophagogastroduodenoscopy (egd) with propofol N/A 03/11/2014    Procedure: ESOPHAGOGASTRODUODENOSCOPY (EGD) WITH PROPOFOL;  Surgeon: Lafayette Dragon, MD;  Location: [facility] ENDOSCOPY;  Service: Endoscopy;  Laterality: N/A;  . Tooth extraction      Family History  Problem Relation Age of Onset  . Heart disease Father     Heart Disease before age 79  . Diabetes Father   . Hyperlipidemia Father   . Hypertension Father   . Heart attack Father   . Deep vein thrombosis Father   . Colon cancer Neg Hx   . Hypertension Mother   . Deep vein thrombosis Mother   . Cancer Sister     Ovarian  .  Hypertension Sister   . Heart disease Paternal Uncle   . Diabetes Paternal Uncle   . Heart attack Paternal Grandfather 90    died of MI at 64  . Diabetes Paternal Grandmother   . Diabetes Paternal Aunt   . Diabetes Paternal Uncle     Social history:  reports that she has never smoked. She has never used smokeless tobacco. She reports that she does not drink alcohol or use illicit drugs.    Allergies  Allergen Reactions  . Amoxicillin Shortness Of Breath and Rash  . Brilinta [Ticagrelor] Shortness Of Breath  . Erythromycin Shortness Of Breath, Other (See Comments) and Hives    Trouble swallowing  . Flagyl [Metronidazole] Palpitations and Shortness Of Breath  . Penicillins Shortness Of Breath, Rash and Hives  . Isosorbide Mononitrate [Isosorbide Nitrate]     Other reaction(s): Other (See Comments) Joint aches, muscles hurt, difficult to walk Joint aches, muscles hurt, difficult to walk  . Metformin And Related     Stomach pain, cold sweats, joint pain, burred vision, dizziness  . Tape Other (See Comments)    Skin pulls off with certain types Plastic tape causes skin to rip if left on for long periods of time  . Jardiance [Empagliflozin]     Nausea, joint aches, muscles aches  . Erythromycin Base Rash    Medications:  Prior to Admission medications   Medication Sig Start Date End Date Taking? Authorizing Provider  acetaminophen (TYLENOL) 325 MG tablet Take 650 mg by mouth every 6 (six) hours as needed for mild pain.    Yes Historical Provider, MD  albuterol (PROVENTIL) (2.5 MG/3ML) 0.083% nebulizer solution Take 2.5 mg by nebulization every 6 (six) hours as needed for shortness of breath (when asthma is active).    Yes Historical Provider, MD  albuterol (PROVENTIL,VENTOLIN) 90 MCG/ACT inhaler Inhale 2 puffs into the lungs every 6 (six) hours as needed for shortness of breath.    Yes Historical Provider, MD  Alirocumab (PRALUENT) 75 MG/ML SOSY Inject 75 mg into the skin every 14  (fourteen) days. 12/18/14  Yes Larey Dresser, MD  ALPRAZolam Duanne Moron) 0.5 MG tablet Take 0.5 mg by mouth 3 (three) times daily as needed for anxiety. For anxiety - Patient takes 1 in the morning, then 1 in the evening, 2 at bedtime   Yes Historical Provider, MD  amLODipine (NORVASC) 10 MG tablet Take 10 mg by mouth daily. 01/20/15  Yes Historical Provider, MD  aspirin EC 81 MG tablet Take 162 mg by mouth daily.   Yes Historical Provider, MD  b complex vitamins capsule Take 1 capsule by mouth daily.   Yes Historical Provider, MD  Calcium Carbonate 1500 (600 CA) MG TABS Take 1,500 mg by mouth daily with breakfast.   Yes Historical Provider, MD  Cholecalciferol (VITAMIN D) 2000 UNITS tablet Take 2,000 Units by mouth daily with breakfast.    Yes Historical Provider, MD  cholestyramine (QUESTRAN) 4 GM/DOSE powder Take 2 g by mouth 2 (two) times daily with a meal. (mixed in juice or light kool-aid). 03/13/13  Yes Larey Dresser, MD  cloNIDine (CATAPRES) 0.2 MG tablet Take 0.2 mg by mouth 2 (two) times daily.   Yes Historical Provider, MD  clopidogrel (PLAVIX) 75 MG tablet Take 1 tablet (75 mg total) by mouth daily. 12/20/14  Yes Larey Dresser, MD  DULoxetine (CYMBALTA) 60 MG capsule Take 60 mg by mouth daily with breakfast.   Yes Historical Provider, MD  Empagliflozin (JARDIANCE PO) Take by mouth.   Yes Historical Provider, MD  ezetimibe (ZETIA) 10 MG tablet Take 10 mg by mouth at bedtime.   Yes Historical Provider, MD  Fluticasone-Salmeterol (ADVAIR DISKUS) 250-50 MCG/DOSE AEPB Inhale 1 puff into the lungs every 12 (twelve) hours.    Yes Historical Provider, MD  furosemide (LASIX) 40 MG tablet 1 and 1/2 tablets (60mg ) by mouth two times a day 02/14/15  Yes Larey Dresser, MD  gabapentin (NEURONTIN) 600 MG tablet Take 300-600  mg by mouth 3 (three) times daily. 1 tablet AM, 1 tablet at noon and 1 1/2 tablet at bedtime.   Yes Historical Provider, MD  HUMALOG KWIKPEN 100 UNIT/ML SOPN Inject 10-14 Units  into the skin 4 (four) times daily -  with meals and at bedtime. 10 units at every meal and 14 units at bedtime if needed 09/19/12  Yes Historical Provider, MD  hydrALAZINE (APRESOLINE) 50 MG tablet Take 50 mg by mouth 3 (three) times daily as needed (for high blood pressure). If BP is >175   Yes Historical Provider, MD  HYDROcodone-acetaminophen (NORCO/VICODIN) 5-325 MG per tablet Take 1 tablet by mouth at bedtime as needed for moderate pain.    Yes Historical Provider, MD  ipratropium-albuterol (DUONEB) 0.5-2.5 (3) MG/3ML SOLN Take 3 mLs by nebulization as needed (If severe asthma , use every 4 hours for 10 days).    Yes Historical Provider, MD  LANTUS SOLOSTAR 100 UNIT/ML Solostar Pen 44 Units daily. Increase/decrease 4 units sugar changes. 08/23/14  Yes Historical Provider, MD  loperamide (IMODIUM) 2 MG capsule Take 2 mg by mouth as needed for diarrhea or loose stools.   Yes Historical Provider, MD  losartan (COZAAR) 100 MG tablet Take 100 mg by mouth daily with breakfast.    Yes Historical Provider, MD  Magnesium Oxide 250 MG TABS Take 400 mg by mouth 2 (two) times daily.    Yes Historical Provider, MD  meclizine (ANTIVERT) 12.5 MG tablet Take 12.5 mg by mouth 3 (three) times daily as needed for dizziness.   Yes Historical Provider, MD  metoprolol succinate (TOPROL XL) 25 MG 24 hr tablet Take 1 tablet (25 mg total) by mouth 2 (two) times daily. 10/23/14  Yes Larey Dresser, MD  Multiple Vitamin (MULTIVITAMIN) tablet Take 1 tablet by mouth at bedtime.    Yes Historical Provider, MD  nitroGLYCERIN (NITROSTAT) 0.3 MG SL tablet Place 1 tablet (0.3 mg total) under the tongue every 5 (five) minutes x 3 doses as needed for chest pain. 02/28/14  Yes Larey Dresser, MD  ondansetron (ZOFRAN) 4 MG tablet Take 1 tablet (4 mg total) by mouth 2 (two) times daily as needed. For nausea 02/12/14  Yes Lafayette Dragon, MD  pantoprazole (PROTONIX) 40 MG tablet Take 1 tablet (40 mg total) by mouth 2 (two) times daily.  11/08/13  Yes Larey Dresser, MD  Probiotic Product (PROBIOTIC DAILY PO) Take 1 tablet by mouth 2 (two) times daily.   Yes Historical Provider, MD  ranolazine (RANEXA) 1000 MG SR tablet TAKE ONE (1) TABLET BY MOUTH TWO (2) TIMES DAILY 09/23/14  Yes Larey Dresser, MD  rosuvastatin (CRESTOR) 40 MG tablet TAKE ONE (1) TABLET BY MOUTH EVERY DAY 03/18/15  Yes Larey Dresser, MD  zinc gluconate 50 MG tablet Take 50 mg by mouth at bedtime.    Yes Historical Provider, MD  zolpidem (AMBIEN) 10 MG tablet Take 10 mg by mouth at bedtime as needed for sleep.    Yes Historical Provider, MD    ROS:  Out of a complete 14 system review of symptoms, the patient complains only of the following symptoms, and all other reviewed systems are negative.  Dizziness Gait disorder Memory problems  Blood pressure 134/56, pulse 63, height 5' 1.75" (1.568 m), weight 289 lb 8 oz (131.316 kg).   Blood pressure, right arm, standing is 126/70. Blood pressure, right arm, sitting is 144/80.  Physical Exam  General: The patient is alert and cooperative at the  time of the examination. The patient is morbidly obese.  Skin: 1+ edema of ankles is noted bilaterally.   Neurologic Exam  Mental status: The patient is alert and oriented x 3 at the time of the examination. The patient has apparent normal recent and remote memory, with an apparently normal attention span and concentration ability.   Cranial nerves: Facial symmetry is present. Speech is normal, no aphasia or dysarthria is noted. Extraocular movements are full. Visual fields are full.  Motor: The patient has good strength in all 4 extremities.  Sensory examination: Soft touch sensation is symmetric on the face, arms, and legs.  Coordination: The patient has good finger-nose-finger and heel-to-shin bilaterally.  Gait and station: The patient has a slightly wide-based gait. Tandem gait is unsteady. Romberg is negative.  Reflexes: Deep tendon reflexes are  symmetric, but are depressed.   Assessment/Plan:  1. Cerebrovascular disease, right posterior cerebral artery distribution stroke  2. Gait disturbance  3. Chronic dizziness  4. Morbid obesity  5. Chronic insomnia  6. Mild memory disturbance  7. Left posterior communicating aneurysm  8. Cervicogenic headache  The patient is having ongoing dizziness, neck pain and headache. Many of the headaches may be cervicogenic in nature. The patient indicates that the dizziness started after her stroke event, the dizziness may be difficult to treat. The patient will be followed for this cerebral aneurysm, but the last MRA of the head did not show evidence of an aneurysm. We may recheck in one year. The patient is being followed for her cerebrovascular disease with carotid Doppler studies. She is on a multitude of medications, she likely is suffering some side effects from polypharmacy. She will follow-up through this office if needed.  Jill Alexanders MD 05/06/2015 8:08 PM  [facility] [address] [place], [place] [address]  Phone [phone] Fax [phone]

## 2015-05-12 ENCOUNTER — Encounter: Payer: Self-pay | Admitting: Family

## 2015-05-12 ENCOUNTER — Ambulatory Visit (INDEPENDENT_AMBULATORY_CARE_PROVIDER_SITE_OTHER): Payer: Medicare Other | Admitting: Family

## 2015-05-12 ENCOUNTER — Ambulatory Visit (HOSPITAL_COMMUNITY)
Admission: RE | Admit: 2015-05-12 | Discharge: 2015-05-12 | Disposition: A | Discharge status: Home / Self Care | Payer: Medicare Other | Source: Ambulatory Visit | Attending: Family | Admitting: Family

## 2015-05-12 VITALS — BP 119/61 | HR 52 | Ht 61.75 in | Wt 292.0 lb

## 2015-05-12 DIAGNOSIS — G4733 Obstructive sleep apnea (adult) (pediatric): Secondary | ICD-10-CM | POA: Diagnosis not present

## 2015-05-12 DIAGNOSIS — I6521 Occlusion and stenosis of right carotid artery: Secondary | ICD-10-CM | POA: Insufficient documentation

## 2015-05-12 DIAGNOSIS — I6522 Occlusion and stenosis of left carotid artery: Secondary | ICD-10-CM | POA: Diagnosis not present

## 2015-05-12 DIAGNOSIS — I509 Heart failure, unspecified: Secondary | ICD-10-CM | POA: Diagnosis not present

## 2015-05-12 DIAGNOSIS — K219 Gastro-esophageal reflux disease without esophagitis: Secondary | ICD-10-CM | POA: Diagnosis not present

## 2015-05-12 DIAGNOSIS — Z959 Presence of cardiac and vascular implant and graft, unspecified: Secondary | ICD-10-CM | POA: Insufficient documentation

## 2015-05-12 DIAGNOSIS — I130 Hypertensive heart and chronic kidney disease with heart failure and stage 1 through stage 4 chronic kidney disease, or unspecified chronic kidney disease: Secondary | ICD-10-CM | POA: Insufficient documentation

## 2015-05-12 DIAGNOSIS — E1122 Type 2 diabetes mellitus with diabetic chronic kidney disease: Secondary | ICD-10-CM | POA: Diagnosis not present

## 2015-05-12 DIAGNOSIS — Z8673 Personal history of transient ischemic attack (TIA), and cerebral infarction without residual deficits: Secondary | ICD-10-CM | POA: Diagnosis not present

## 2015-05-12 DIAGNOSIS — N189 Chronic kidney disease, unspecified: Secondary | ICD-10-CM | POA: Diagnosis not present

## 2015-05-12 DIAGNOSIS — I6523 Occlusion and stenosis of bilateral carotid arteries: Secondary | ICD-10-CM

## 2015-05-12 DIAGNOSIS — E785 Hyperlipidemia, unspecified: Secondary | ICD-10-CM | POA: Diagnosis not present

## 2015-05-12 DIAGNOSIS — Z6841 Body Mass Index (BMI) 40.0 and over, adult: Secondary | ICD-10-CM

## 2015-05-12 NOTE — Patient Instructions (Signed)
Stroke Prevention Some medical conditions and behaviors are associated with an increased chance of having a stroke. You may prevent a stroke by making healthy choices and managing medical conditions. HOW CAN I REDUCE MY RISK OF HAVING A STROKE?   Stay physically active. Get at least 30 minutes of activity on most or all days.  Do not smoke. It may also be helpful to avoid exposure to secondhand smoke.  Limit alcohol use. Moderate alcohol use is considered to be:  No more than 2 drinks per day for men.  No more than 1 drink per day for nonpregnant women.  Eat healthy foods. This involves:  Eating 5 or more servings of fruits and vegetables a day.  Making dietary changes that address high blood pressure (hypertension), high cholesterol, diabetes, or obesity.  Manage your cholesterol levels.  Making food choices that are high in fiber and low in saturated fat, trans fat, and cholesterol may control cholesterol levels.  Take any prescribed medicines to control cholesterol as directed by your health care provider.  Manage your diabetes.  Controlling your carbohydrate and sugar intake is recommended to manage diabetes.  Take any prescribed medicines to control diabetes as directed by your health care provider.  Control your hypertension.  Making food choices that are low in salt (sodium), saturated fat, trans fat, and cholesterol is recommended to manage hypertension.  Ask your health care provider if you need treatment to lower your blood pressure. Take any prescribed medicines to control hypertension as directed by your health care provider.  If you are 18-39 years of age, have your blood pressure checked every 3-5 years. If you are 40 years of age or older, have your blood pressure checked every year.  Maintain a healthy weight.  Reducing calorie intake and making food choices that are low in sodium, saturated fat, trans fat, and cholesterol are recommended to manage  weight.  Stop drug abuse.  Avoid taking birth control pills.  Talk to your health care provider about the risks of taking birth control pills if you are over 35 years old, smoke, get migraines, or have ever had a blood clot.  Get evaluated for sleep disorders (sleep apnea).  Talk to your health care provider about getting a sleep evaluation if you snore a lot or have excessive sleepiness.  Take medicines only as directed by your health care provider.  For some people, aspirin or blood thinners (anticoagulants) are helpful in reducing the risk of forming abnormal blood clots that can lead to stroke. If you have the irregular heart rhythm of atrial fibrillation, you should be on a blood thinner unless there is a good reason you cannot take them.  Understand all your medicine instructions.  Make sure that other conditions (such as anemia or atherosclerosis) are addressed. SEEK IMMEDIATE MEDICAL CARE IF:   You have sudden weakness or numbness of the face, arm, or leg, especially on one side of the body.  Your face or eyelid droops to one side.  You have sudden confusion.  You have trouble speaking (aphasia) or understanding.  You have sudden trouble seeing in one or both eyes.  You have sudden trouble walking.  You have dizziness.  You have a loss of balance or coordination.  You have a sudden, severe headache with no known cause.  You have new chest pain or an irregular heartbeat. Any of these symptoms may represent a serious problem that is an emergency. Do not wait to see if the symptoms will   go away. Get medical help at once. Call your local emergency services (911 in [place] Do not drive yourself to the hospital.   This information is not intended to replace advice given to you by your health care provider. Make sure you discuss any questions you have with your health care provider.   Document Released: 02/26/2004 Document Revised: 02/08/2014 Document Reviewed:  07/21/2012 Elsevier Interactive Patient Education 2016 [employer]  

## 2015-05-12 NOTE — Progress Notes (Signed)
Chief Complaint: Extracranial Carotid Artery Stenosis   History of Present Illness  [name] [name] is a 70 y.o. female patient of Dr. Trula Slade who is s/p left carotid stent placement on 03/20/2013 with distal embolic protection. This was done in the setting of a symptomatic left carotid stenosis. The patient was having nearly daily episodes of aphasia.  She returns today for follow up. Dr. Jannifer Franklin, her neurologist, requested a carotid Duplex that was performed on 09/27/13, copy of result reviewed which shows minimal bilateral ICA stenosis.   She continues to have TIA's, pt states Dr. Jannifer Franklin is not able to determine etiology; she has constant vertigo; occasionally has expressive aphasia.  October 2015 stroke happened in cardiac rehab as manifested by could no longer stand up, feeling dizzy; saw Dr. Jannifer Franklin that day, had an MRI, pt states Dr. Jannifer Franklin told her that she had 2 more strokes. She continues to have intermittent expressive aphasia.  Pt states Dr. Jannifer Franklin is not sure of the etiology of her stokes, but states she has two brain aneurysms.   July, 2015 she had what she thinks was a stroke as manifested by feeling hot, off balance, headache, ataxia.  Dr. Jannifer Franklin requested an MRI of her brain to evaluate this and pt states she was told that she had another stroke; she still feels off balance, occasional headaches, occasional crawling sensation on left side of face with exertion, occasional mild expressive aphasia.  February, 2014 she lost vision in both eyes, her vision has since improved with some loss of acuity. She denies hemiparesis.  She has continued followup with Dr. Estanislado Pandy for vertebral artery stenosis as well as intracranial aneurysm.   She was hospitalized 6 times at [facility] since February, 2015 for ischemic CAD, 4 stents placed; chest pain. Her cardiologist is Dr. [name].  She has a hx of bariatric surgery in the 1980's and has nausea and sometimes vomiting every morning on an  empty stomach.  Pt Diabetic: Yes, pt states her March 2017 A1C was 7.1, Dr. Buddy Duty is her endocrinologist.  Pt smoker: non-smoker  Pt meds include: Statin : Yes, also taking injectable Praluent for difficult to control cholesterol ASA: Yes, 2- 81 mg Other anticoagulants/antiplatelets: Plavix   Past Medical History  Diagnosis Date  . Vomiting     persistent  . Asthma   . Ventral hernia     hx of  . GERD (gastroesophageal reflux disease)   . Obesity 01-2010  . Hyperlipidemia   . Hypertension   . Irregular heart beat   . Coronary artery disease   . Heart murmur   . Anxiety   . PONV (postoperative nausea and vomiting)   . Peripheral vascular disease (HCC)     ? numbness, tingling arms and legs  . Anemia     hx  . H/O hiatal hernia   . Neuromuscular disorder ([place])     ?  Marland Kitchen Dysrhythmia   . Mental disorder     hx of confusion  . CHF (congestive heart failure) ([place])   . Anginal pain ([place])   . Pneumonia 2000's    "once"  . Chronic bronchitis ([place])     "off and on all the time" (07/18/2013)  . Claustrophobia   . Type II diabetes mellitus ([place])   . Chronic lower back pain   . Other and unspecified angina pectoris   . Obstructive sleep apnea     "can't wear machine; I have claustrophobia" (07/18/2013)  . Osteoarthritis     "  knees and hands" (07/18/2013)  . Bundle branch block, left     chronic/notes 07/18/2013  . Vomiting blood   . Myocardial infarction ([place])     04/1999, 02/2000, 01/2005; 2011; 2014  . Shortness of breath     with exertion  . Chronic kidney disease     frequency, sses dr Jamal Maes every 4 to 6 months  . Migraine     "used to have them really bad; kind of eased up now; down to 1 q couple months" (07/18/2013)  . Headache     "probably 4 days/wk" (07/18/2013)  . Leg cramps     both legs at times  . Cancer [facility]) April 2016    BCC,  Melanoma  . Common migraine 05/14/2014  . Abnormality of gait 05/14/2014  . Memory change 05/14/2014  . Chronic insomnia  05/06/2015  . Stroke ([place]) 03/22/12    right side brain; denies residual on 07/18/2013)  . Stroke [facility]) Oct. 2015    Affected pt.s balance  . Stroke [facility]) 10/2014    Social History Social History  Substance Use Topics  . Smoking status: Never Smoker   . Smokeless tobacco: Never Used  . Alcohol Use: No    Family History Family History  Problem Relation Age of Onset  . Heart disease Father     Heart Disease before age 62  . Diabetes Father   . Hyperlipidemia Father   . Hypertension Father   . Heart attack Father   . Deep vein thrombosis Father   . Colon cancer Neg Hx   . Hypertension Mother   . Deep vein thrombosis Mother   . Cancer Sister     Ovarian  . Hypertension Sister   . Heart disease Paternal Uncle   . Diabetes Paternal Uncle   . Heart attack Paternal Grandfather 71    died of MI at 15  . Diabetes Paternal Grandmother   . Diabetes Paternal Aunt   . Diabetes Paternal Uncle     Surgical History Past Surgical History  Procedure Laterality Date  . Tumor removal  1970's    ovarian  . Tibia fracture surgery Right 1970's    rods and pins  . Gastric bypass  1984    "stapeling"  . Total abdominal hysterectomy  1970's    w/ appendectomy  . Ankle fracture surgery Left 1970's  . Fracture surgery    . Esophagogastroduodenoscopy  08/03/2011    Procedure: ESOPHAGOGASTRODUODENOSCOPY (EGD);  Surgeon: Shann Medal, MD;  Location: [facility];  Service: General;  Laterality: N/A;  . Gall stone removal  05/2003  . Root canal  10/2000  . Appendectomy  1970's    w/hysterectomy  . Coronary angioplasty with stent placement  01,02,05,06,07,08,11; 04/24/2013    "I've probably got ~ 10 stents by now" (04/24/2013)  . Coronary angioplasty with stent placement  06/13/2013    "got 4 stents today" (06/13/2013)  . Cardiac catheterization  10/10/2012    Dr [name].  . Carotid endarterectomy Left 03/2013  . Cerebral angiogram N/A 04/05/2011    Procedure: CEREBRAL ANGIOGRAM;  Surgeon:  Angelia Mould, MD;  Location: [facility] CATH LAB;  Service: Cardiovascular;  Laterality: N/A;  . Carotid stent insertion Left 03/20/2013    Procedure: CAROTID STENT INSERTION;  Surgeon: [name], MD;  Location: [facility] CATH LAB;  Service: Cardiovascular;  Laterality: Left;  internal carotid  . Percutaneous stent intervention N/A 04/24/2013    Procedure: PERCUTANEOUS STENT INTERVENTION;  Surgeon: [name],  MD;  Location: [place] CATH LAB;  Service: Cardiovascular;  Laterality: N/A;  . Percutaneous coronary stent intervention (pci-s) N/A 06/13/2013    Procedure: PERCUTANEOUS CORONARY STENT INTERVENTION (PCI-S);  Surgeon: [name], MD;  Location: [facility] CATH LAB;  Service: Cardiovascular;  Laterality: N/A;  . Coronary artery bypass graft  1220/11    "CABG X5"  . Cholecystectomy  2004  . Hernia repair  2004    "in my stomach; had OR on it twice", wire mesh on 1 hernia  . Esophagogastroduodenoscopy (egd) with propofol N/A 03/11/2014    Procedure: ESOPHAGOGASTRODUODENOSCOPY (EGD) WITH PROPOFOL;  Surgeon: Lafayette Dragon, MD;  Location: [facility] ENDOSCOPY;  Service: Endoscopy;  Laterality: N/A;  . Tooth extraction      Allergies  Allergen Reactions  . Amoxicillin Shortness Of Breath and Rash  . Brilinta [Ticagrelor] Shortness Of Breath  . Erythromycin Shortness Of Breath, Other (See Comments) and Hives    Trouble swallowing  . Flagyl [Metronidazole] Palpitations and Shortness Of Breath  . Penicillins Shortness Of Breath, Rash and Hives  . Isosorbide Mononitrate [Isosorbide Nitrate]     Other reaction(s): Other (See Comments) Joint aches, muscles hurt, difficult to walk Joint aches, muscles hurt, difficult to walk  . Metformin And Related     Stomach pain, cold sweats, joint pain, burred vision, dizziness  . Tape Other (See Comments)    Skin pulls off with certain types Plastic tape causes skin to rip if left on for long periods of time  . Jardiance [Empagliflozin]     Nausea, joint  aches, muscles aches  . Erythromycin Base Rash    Current Outpatient Prescriptions  Medication Sig Dispense Refill  . acetaminophen (TYLENOL) 325 MG tablet Take 650 mg by mouth every 6 (six) hours as needed for mild pain.     Marland Kitchen albuterol (PROVENTIL) (2.5 MG/3ML) 0.083% nebulizer solution Take 2.5 mg by nebulization every 6 (six) hours as needed for shortness of breath (when asthma is active).     Marland Kitchen albuterol (PROVENTIL,VENTOLIN) 90 MCG/ACT inhaler Inhale 2 puffs into the lungs every 6 (six) hours as needed for shortness of breath.     . Alirocumab (PRALUENT) 75 MG/ML SOSY Inject 75 mg into the skin every 14 (fourteen) days. 2 Syringe 11  . ALPRAZolam (XANAX) 0.5 MG tablet Take 0.5 mg by mouth at bedtime as needed for anxiety. For anxiety - Patient takes 1 in the morning, then 1 in the evening, 2 at bedtime    . amLODipine (NORVASC) 10 MG tablet Take 10 mg by mouth daily.    Marland Kitchen aspirin EC 81 MG tablet Take 162 mg by mouth daily.    Marland Kitchen b complex vitamins capsule Take 1 capsule by mouth daily.    . Calcium Carbonate 1500 (600 CA) MG TABS Take 1,500 mg by mouth daily with breakfast.    . Cholecalciferol (VITAMIN D) 2000 UNITS tablet Take 2,000 Units by mouth daily with breakfast.     . cholestyramine (QUESTRAN) 4 GM/DOSE powder Take 2 g by mouth 2 (two) times daily with a meal. (mixed in juice or light kool-aid).    . cloNIDine (CATAPRES) 0.2 MG tablet Take 0.2 mg by mouth 2 (two) times daily.    . clopidogrel (PLAVIX) 75 MG tablet Take 1 tablet (75 mg total) by mouth daily. 90 tablet 2  . DULoxetine (CYMBALTA) 60 MG capsule Take 60 mg by mouth daily with breakfast.    . Empagliflozin (JARDIANCE PO) Take by mouth.    Marland Kitchen  ezetimibe (ZETIA) 10 MG tablet Take 10 mg by mouth at bedtime.    . Fluticasone-Salmeterol (ADVAIR DISKUS) 250-50 MCG/DOSE AEPB Inhale 1 puff into the lungs every 12 (twelve) hours.     . furosemide (LASIX) 40 MG tablet 1 and 1/2 tablets (60mg ) by mouth two times a day 270 tablet 1   . gabapentin (NEURONTIN) 600 MG tablet Take 300-600 mg by mouth 3 (three) times daily. 1 tablet AM, 1 tablet at noon and 1 1/2 tablet at bedtime.    Marland Kitchen HUMALOG KWIKPEN 100 UNIT/ML SOPN Inject 10-14 Units into the skin 4 (four) times daily -  with meals and at bedtime. 10 units at every meal and 14 units at bedtime if needed    . hydrALAZINE (APRESOLINE) 50 MG tablet Take 50 mg by mouth 3 (three) times daily as needed (for high blood pressure). If BP is >175    . HYDROcodone-acetaminophen (NORCO/VICODIN) 5-325 MG per tablet Take 1 tablet by mouth at bedtime as needed for moderate pain.     Marland Kitchen ipratropium-albuterol (DUONEB) 0.5-2.5 (3) MG/3ML SOLN Take 3 mLs by nebulization as needed (If severe asthma , use every 4 hours for 10 days).     Marland Kitchen LANTUS SOLOSTAR 100 UNIT/ML Solostar Pen 44 Units daily. Increase/decrease 4 units sugar changes.    Marland Kitchen loperamide (IMODIUM) 2 MG capsule Take 2 mg by mouth as needed for diarrhea or loose stools.    Marland Kitchen losartan (COZAAR) 100 MG tablet Take 100 mg by mouth daily with breakfast.     . Magnesium Oxide 250 MG TABS Take 400 mg by mouth 2 (two) times daily.     . meclizine (ANTIVERT) 12.5 MG tablet Take 12.5 mg by mouth 3 (three) times daily as needed for dizziness.    . metoprolol succinate (TOPROL XL) 25 MG 24 hr tablet Take 1 tablet (25 mg total) by mouth 2 (two) times daily. 60 tablet 6  . Multiple Vitamin (MULTIVITAMIN) tablet Take 1 tablet by mouth at bedtime.     . nitroGLYCERIN (NITROSTAT) 0.3 MG SL tablet Place 1 tablet (0.3 mg total) under the tongue every 5 (five) minutes x 3 doses as needed for chest pain. 90 tablet 1  . ondansetron (ZOFRAN) 4 MG tablet Take 1 tablet (4 mg total) by mouth 2 (two) times daily as needed. For nausea 20 tablet 0  . pantoprazole (PROTONIX) 40 MG tablet Take 1 tablet (40 mg total) by mouth 2 (two) times daily. 180 tablet 3  . Probiotic Product (PROBIOTIC DAILY PO) Take 1 tablet by mouth 2 (two) times daily.    . ranolazine (RANEXA)  1000 MG SR tablet TAKE ONE (1) TABLET BY MOUTH TWO (2) TIMES DAILY 60 each 6  . rosuvastatin (CRESTOR) 40 MG tablet TAKE ONE (1) TABLET BY MOUTH EVERY DAY 90 tablet 0  . zinc gluconate 50 MG tablet Take 50 mg by mouth at bedtime.     Marland Kitchen zolpidem (AMBIEN) 10 MG tablet Take 10 mg by mouth at bedtime as needed for sleep.      No current facility-administered medications for this visit.    Review of Systems : See HPI for pertinent positives and negatives.  Physical Examination  Filed Vitals:   05/12/15 1119 05/12/15 1124  BP: 120/63 119/61  Pulse: 52   Height: 5' 1.75" (1.568 m)   Weight: 292 lb (132.45 kg)   SpO2: 91%    Body mass index is 53.87 kg/(m^2).  General: WDWN morbidly obese female in NAD GAIT:  normal Eyes: PERRLA Pulmonary: Non-labored, CTAB but distant sounding Cardiac: regular Rhythm, no detected murmur.  VASCULAR EXAM Carotid Bruits Right Left   positive positve   Radial pulses are 1+ palpable and equal.      LE Pulses Right Left   POPLITEAL not palpable  not palpable   POSTERIOR TIBIAL  palpable   palpable    DORSALIS PEDIS  ANTERIOR TIBIAL faintly palpable  faintly palpable     Gastrointestinal: soft, nontender, BS WNL, no r/g, no palpable masses.  Musculoskeletal: Negative muscle atrophy/wasting. M/S 4/5 throughout, Extremities without ischemic changes.  Neurologic: A&O X 3; Appropriate Affect, Speech is normal CN 2-12 intact, Pain and light touch intact in extremities, Motor exam as listed above.                Non-Invasive Vascular Imaging CAROTID DUPLEX 05/12/2015   >50% bilateral ECA stenosis 1-39% right ICA stenosis Left carotid artery stent with no stenosis. No significant change from 05/10/14 exam.    Assessment: [name] [name] is a 70 y.o.  female who is s/p left carotid stent placement on 03/20/2013 with distal embolic protection. This was done in the setting of a symptomatic left carotid stenosis. The patient was having nearly daily episodes of aphasia.  October 2015 stroke happened in cardiac rehab as manifested by could no longer stand up, feeling dizzy; saw Dr. Jannifer Franklin that day, had an MRI, pt states Dr. Jannifer Franklin told her that she had 2 more strokes. She continues to have intermittent expressive aphasia.  Pt states Dr. Jannifer Franklin is not sure of the etiology of her stokes, but states she has two brain aneurysms.  Today's carotid Duplex suggests 1-39% right ICA stenosis. Left carotid artery stent with no stenosis. No significant change from 05/10/14 exam.    Plan: Follow-up in 1 year with Carotid Duplex scan.   I discussed in depth with the patient the nature of atherosclerosis, and emphasized the importance of maximal medical management including strict control of blood pressure, blood glucose, and lipid levels, obtaining regular exercise, and continued cessation of smoking.  The patient is aware that without maximal medical management the underlying atherosclerotic disease process will progress, limiting the benefit of any interventions. The patient was given information about stroke prevention and what symptoms should prompt the patient to seek immediate medical care. Thank you for allowing [place] to participate in this patient's care.  Clemon Chambers, RN, MSN, FNP-C [facility] Office: [phone]  Clinic Physician: Kellie Simmering  05/12/2015 11:27 AM

## 2015-05-15 ENCOUNTER — Other Ambulatory Visit: Payer: Self-pay | Admitting: Cardiology

## 2015-05-20 ENCOUNTER — Encounter: Payer: Medicare Other | Attending: Cardiology | Admitting: Dietician

## 2015-05-20 DIAGNOSIS — Z713 Dietary counseling and surveillance: Secondary | ICD-10-CM | POA: Diagnosis not present

## 2015-05-20 DIAGNOSIS — Z6841 Body Mass Index (BMI) 40.0 and over, adult: Secondary | ICD-10-CM | POA: Diagnosis not present

## 2015-05-20 DIAGNOSIS — E118 Type 2 diabetes mellitus with unspecified complications: Secondary | ICD-10-CM | POA: Insufficient documentation

## 2015-05-20 DIAGNOSIS — E119 Type 2 diabetes mellitus without complications: Secondary | ICD-10-CM

## 2015-05-20 DIAGNOSIS — Z794 Long term (current) use of insulin: Secondary | ICD-10-CM | POA: Insufficient documentation

## 2015-05-20 NOTE — Progress Notes (Signed)
Medical Nutrition Therapy:  Appt start time: [id number]  end time:  [id number].   Assessment:  Primary concerns today: Patient is here today for a follow up.  She would like to lose weight.  She states that "she hates herself" and that weight is a big part of this.   Type 2 diagnosed in 2001. Other hx includes stage 3 CKD (potassium 5.1 and GFR 43.8 10/23/14- she is followed by Dr. Lorrene Reid), CVA 2013 and 2015, MI 2001, CHF, and multiple stents. Hx also includes Gastric bypass surgery "that did not work". She reports severe vomiting at times and has slow emptying. Hernia surgery in past but feels that it has returned. No appetite and eats no breakfast. From her history, intake is not excessive. She follows a low sodium diet.  She reported that one day she did not eat or drink until 5:00pm.  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  She has made some changes in the past month, trying romaine, which she enjoys as well as continuing to incorporate salmon, tuna, and dried beans in her diet.  She has also enjoyed the Mrs. Dash seasoning products.She has been going to rehab and this has reduced her pain and they are working to improve her walking.  They asked that she go to the gym a couple of days per week in addition to rehab and she is considering using the pool there.  She wants her husband to take time off work to go with her.  She has gained a couple lbs since last visit.  Still complains of increased fluid gain/swelling.  Further discussion of her diet reveals use of regular bullion cube to season.  Discussed further need to avoid any added sodium as well as importance of improving her fluid intake and nutrition.  She states that she feels that she needs to see a Social worker.  She states that MD stated that she does not need to take the Metformin or Vitamin D at this time.  Current labs unavailable for  this visit.  Patient is here alone. She is retired and lives with her husband. Weight has always been a struggle. Lowest of 200-225 until 2001, 250 lbs UBW until 6-8 months ago then became ill, was on IV antibiotics and very poor energy for 4 moths and was unable to exercise and is at 283 lbs today. She is frustrated by her continued weight gain, has a poor body image, and depression due to health and other underlying causes. We discussed this. She is a member of a sports center 3 miles from her home but does not go.   03/11/15: Patient is here alone for nutrition follow up of diabetes and weight.  She continues to skip meals and reported only 3 cups of fluid intake yesterday.  Her weight has increased 2 lbs since last visit but this may also be fluid.  Her protein intake is inadequate often.  She continues to state that she hates herself and her body.   She states that her urine is very dark.  MD increased lasix but she is not urinating much.  She reports sleeping only 2 hours per night despite xanax and Ambien.   She has OSA but states that she cannot tolerate the C-pap mask.  She is having difficulty also dealing with decreasing health with aging.    04/14/15: Patient is here alone for  follow up for nutrition for diabetes and weight.  She is doing better with drinking more fluid and eating small amounts throughout the day but is still not eating significant amounts per patient.  She also gets discouraged and fearful to eat because of her weight.  Today's weight is 280 lbs decreased from 287 lbs at our last visit 03/11/15.  She states that the loss is due to fluid loss.  She is being careful about the sodium content of foods.  She remains depressed and is more open to counseling at this time.  She has started Jardiance for her blood sugar but states that she may not be able to continue this due to "feeling sick and tired due to the side effects of the medications".    05/20/15: Patient is here alone.   She was not able to find a counselor that her insurance accepted.  Her husband was out of town for his job for 3 weeks.  She is more depressed and lonely during this time.  She states that she does not eat supper well when he is not there.  She states that she has felt worse mentally over the past month.  She verbalized her self hate due to her weight and overall unhappiness.  292 lbs today.  Preferred Learning Style:   No preference indicated   Learning Readiness:   Ready  Change in progress  MEDICATIONS: see list   DIETARY INTAKE:  Usual eating pattern includes 1-2 meals and 1-2 snacks per day. Avoided foods include Many vegetables taste bitter to her.    24-hr recall:  B ( AM): yogurt Snk ( AM): often skips  L ( PM): raw veges Snk ( PM): nuts D ( PM): has been skipping Snk ( PM):  Beverages: coffee, tea "hates water"  Usual physical activity: current physical rehab  Estimated energy needs: 1200 calories 135 g carbohydrates 90 g protein 33 g fat  Progress Towards Goal(s):  In progress.   Nutritional Diagnosis:  NB-1.1 Food and nutrition-related knowledge deficit As related to balance of energy intake/expenditure,sodium needs, and basic nutrition.  As evidenced by patient report and weight gain..    Intervention:  Nutrition counseling/education continued for body acceptance, low sodium guidelines, and importance of maintaining nutrition and hydration.  We discussed the importance of caring for herself.  Discussed benefits of getting out more.  Provided her the card for Therapeutic Alternatives.  Drink water first thing when you get up. Eat within one hour of waking Breakfast, lunch, dinner daily Small snacks when hungry. Listen to your body Small amount of protein each time you eat. Aim for 3 things per meal (1/2 sandwich, veges, fruit for example) Inquire about counseling.  Teaching Method Utilized:  Visual Auditory  Handouts given during visit  include: Breakfast ideas, my plate, counseling list again per her request  Barriers to learning/adherence to lifestyle change: physical challenges and depression.  Demonstrated degree of understanding via:  Teach Back   Monitoring/Evaluation:  Dietary intake, exercise, label reading, and body weight in 1 month(s).

## 2015-05-20 NOTE — Patient Instructions (Signed)
Superman pose. Get together with a friend.  Drink water first thing when you get up. Eat within one hour of waking Breakfast, lunch, dinner daily!!! Small snacks when hungry. Listen to your body Small amount of protein each time you eat. Aim for 3 things per meal (1/2 sandwich, veges, fruit for example) Inquire about counseling.

## 2015-05-21 ENCOUNTER — Ambulatory Visit (INDEPENDENT_AMBULATORY_CARE_PROVIDER_SITE_OTHER): Payer: Medicare Other | Admitting: Cardiology

## 2015-05-21 ENCOUNTER — Encounter: Payer: Self-pay | Admitting: *Deleted

## 2015-05-21 ENCOUNTER — Encounter: Payer: Self-pay | Admitting: Cardiology

## 2015-05-21 VITALS — BP 120/62 | HR 56 | Ht 61.75 in | Wt 292.0 lb

## 2015-05-21 DIAGNOSIS — I5032 Chronic diastolic (congestive) heart failure: Secondary | ICD-10-CM

## 2015-05-21 DIAGNOSIS — R0602 Shortness of breath: Secondary | ICD-10-CM

## 2015-05-21 DIAGNOSIS — I25709 Atherosclerosis of coronary artery bypass graft(s), unspecified, with unspecified angina pectoris: Secondary | ICD-10-CM | POA: Diagnosis not present

## 2015-05-21 DIAGNOSIS — I208 Other forms of angina pectoris: Secondary | ICD-10-CM

## 2015-05-21 LAB — CBC
HCT: 42.1 % (ref 35.0–45.0)
Hemoglobin: 13.9 g/dL (ref 11.7–15.5)
MCH: 32.1 pg (ref 27.0–33.0)
MCHC: 33 g/dL (ref 32.0–36.0)
MCV: 97.2 fL (ref 80.0–100.0)
MPV: 10.8 fL (ref 7.5–12.5)
Platelets: 245 10*3/uL (ref 140–400)
RBC: 4.33 MIL/uL (ref 3.80–5.10)
RDW: 13.1 % (ref 11.0–15.0)
WBC: 7.8 10*3/uL (ref 3.8–10.8)

## 2015-05-21 LAB — BASIC METABOLIC PANEL
BUN: 22 mg/dL (ref 7–25)
CO2: 34 mmol/L — ABNORMAL HIGH (ref 20–31)
Calcium: 9.5 mg/dL (ref 8.6–10.4)
Chloride: 99 mmol/L (ref 98–110)
Creat: 1.09 mg/dL — ABNORMAL HIGH (ref 0.50–0.99)
Glucose, Bld: 163 mg/dL — ABNORMAL HIGH (ref 65–99)
Potassium: 5.2 mmol/L (ref 3.5–5.3)
Sodium: 142 mmol/L (ref 135–146)

## 2015-05-21 NOTE — Patient Instructions (Addendum)
Your physician has recommended you make the following change in your medication:  1). INCREASE LASIX TO 69 MG TWICE A DAY  Your physician recommends that you return for lab work in: TODAY (BMET, BNP, CBC, INR)  Your physician recommends that you return for lab work in: [place] (BMET)  Your physician has requested that you have an echocardiogram. Echocardiography is a painless test that uses sound waves to create images of your heart. It provides your doctor with information about the size and shape of your heart and how well your heart's chambers and valves are working. This procedure takes approximately one hour. There are no restrictions for this procedure.  DR. [name] RECOMMENDS THAT YOU WEAR COMPRESSION STOCKINGS ON YOUR LEGS TO HELP WITH CIRCULATION/SWELLING.

## 2015-05-22 DIAGNOSIS — I208 Other forms of angina pectoris: Secondary | ICD-10-CM | POA: Insufficient documentation

## 2015-05-22 LAB — PROTIME-INR
INR: 0.96 (ref <1.50)
Prothrombin Time: 12.9 seconds (ref 11.6–15.2)

## 2015-05-22 LAB — BRAIN NATRIURETIC PEPTIDE: Brain Natriuretic Peptide: 84.2 pg/mL (ref <100)

## 2015-05-22 NOTE — Progress Notes (Signed)
Patient ID: [name] [name], female   DOB: April 11, 1945, 70 y.o.   [name]: [id number] PCP: Dr. Sandi Mariscal  70 yo with history of CAD s/p CABG, diastolic CHF, and cerebrovascular disease with history of CVA presents for cardiology followup. She had CABG x 5 in 12/11.  Prior to the CABG she had multiple PCIs.  She had a CVA in 2/14 that presented as visual loss.  She had a left carotid stent in 2/15.   Given dyspnea and chest pain that was worsened from her baseline, I took [name] for left heart catheterization in 9/14.  This showed patent SVG-D, LIMA-LAD, and sequential SVG-OM branches but SVG-RCA and the native RCA were both totally occluded.  I suspect that her increased symptoms coincided with occlusion of SVG-RCA.  I started her on Imdur to see if this would help with the chest pain and dyspnea.  However, she feels like Imdur causes leg cramps and does not think that she can take it.  I next started her on ranolazine 500 mg bid and titrated this up to 1000 mg bid.  This helped some but not markedly.  Therefore, I had her see Dr. Irish Lack to address opening RCA CTO. He was able to do this in 5/15 with 4 overlapping Xience DES.  This led to resolution of exertional chest pain.   Mrs Matousek was started on Brilinta after PCI.  She became much more short of breath after starting on Brilinta.  I had her stop Brilinta and replaced it with Plavix.  Dyspnea improved off Brilinta.   At last appointment, she was more short of breath with exertion and appeared volume overloaded. I increased her Lasix.  Today, she reports ongoing dyspnea, worse despite increased Lasix.  She is short of breath after walking 50-100 feet.  She is short of breath walking up steps.  She has developed chest pressure with walking up steps or walking a long distance.  This is similar to symptoms with prior cardiac ischemia. Weight is up 6 lbs.     Labs (6/14): K 3.8, creatinine 0.71 Labs (8/14): BNP 249, LDL 166 Labs (9/14): K 4.7, creatinine  0.7 Labs (9/14): K 4.6, creatinine 0.9, BNP 109 Labs (1/15): K 4.5, creatinine 0.73, LDL 212, HCT 43.4, TSH normal, BNP 138 Labs (6/15): K 4.7, creatinine 1.17, LDL 100, HDL 37, TGs 363, BNP 39 Labs (10/15): K 4.6, creatinine 1.2 Labs (1/16): LDL 157, HDL 43, K 5.2, creatinine 0.95 Labs (2/16): K 4.5, creatinine 1.08, BNP 113 Labs (4/16): LDL 50, HDL 56, TGs 179 Labs (9/16): K 5.3, creatinine 1.09 Labs (10/16): LDL 75, HDL 56 Labs (2/17): K 5.2, creatinine 1.07  ECG: NSR, LBBB  PMH: 1. Diabetic gastroparesis 2. Type II diabetes 3. HTN 4. Morbid obesity 5. CAD: s/p CABG in 12/11 after prior PCIs.  LIMA-LAD, SVG-D, seq SVG-OM1 and OM2, SVG-PDA. Adenosine Cardiolite (8/14) with EF 53% and a small reversible apical defect with a medium, partially reversible inferior defect.  LHC (9/14) with patent SVG-D, LIMA-LAD (50% distal LAD), and sequential SVG-OM branches; the SVG-RCA and the native RCA were occluded.  This was managed medically initially, but with ongoing exertional chest pain, it was decided to open CTO.  Patient had CTO opening with 4 overlapping Xience DES in the RCA in 5/15.  .  6. Atypical migraines 7. OSA: Intolerant of CPAP.  8. GERD with hiatal hernia 9. OA 10. Diastolic CHF: Echo ([id number]) with EF 50-55%, grade II diastolic dysfunction, mild-moderate MR.  Echo (  8/14) with EF 55-60%, grade II diastolic dysfunction, mildly increased aortic valve gradient (mean 12 mmHg) but valve opens well, mild MR and mild RV dilation.  Echo (9/15) with EF 60-65%, grade II diastolic dysfunction, mild aortic stenosis, mild mitral stenosis, mild to moderate mitral regurgitation, mildly dilated RV with normal systolic function, PA systolic pressure 46 mmHg.  11. CKD 12. Chronic LBBB 13. Anxiety 14. Carotid stenosis: Followed by [facility], [id number] LICA stenosis [id number].  She had left carotid stent in 2/15. Carotid dopplers 8/15 with no significant disease. Carotid dopplers (4/16): < 40% RICA stenosis.  15.  Cerebrovascular disease: CVA 2/14 with right posterior cerebral artery territory ischemic infarction. Cerebral angiogram in 6/14 showed 70% right vertebral ostial stenosis, 123456 LICA stenosis, > [id number] proximal left posterior cerebral artery stenosis, posterior communicating artery aneurysm.  Patient has had episodes of transient expressive aphasia.  Carotid dopplers ([id number]) showed [id number] LICA stenosis. She had a left carotid stent 2/15.  Possible CVA in 7/15.  16. Positional vertigo (suspected) 17. Palpitations: Holter (6/15) with rare PVCs/PACs.  18. Dyspnea with Brilinta. 19. Melanoma: On face, s/p excision.   20. Aortic stenosis: Mild.  21. Holter (8/16) with no significant arrhythmias.  22. Lower extremity arterial dopplers (9/16) were normal.   SH: Married, nonsmoker  FH: CAD  ROS: All systems reviewed and negative except as per HPI.   Current Outpatient Prescriptions  Medication Sig Dispense Refill  . acetaminophen (TYLENOL) 325 MG tablet Take 650 mg by mouth every 6 (six) hours as needed for mild pain.     Marland Kitchen albuterol (PROVENTIL) (2.5 MG/3ML) 0.083% nebulizer solution Take 2.5 mg by nebulization every 6 (six) hours as needed for shortness of breath (when asthma is active).     Marland Kitchen albuterol (PROVENTIL,VENTOLIN) 90 MCG/ACT inhaler Inhale 2 puffs into the lungs every 6 (six) hours as needed for shortness of breath.     . Alirocumab (PRALUENT) 75 MG/ML SOSY Inject 75 mg into the skin every 14 (fourteen) days. 2 Syringe 11  . ALPRAZolam (XANAX) 0.5 MG tablet Take 0.5 mg by mouth at bedtime as needed for anxiety. For anxiety - Patient takes 1 in the morning, then 1 in the evening, 2 at bedtime    . amLODipine (NORVASC) 10 MG tablet Take 10 mg by mouth daily.    Marland Kitchen aspirin EC 81 MG tablet Take 162 mg by mouth daily.    Marland Kitchen b complex vitamins capsule Take 1 capsule by mouth daily.    . Calcium Carbonate 1500 (600 CA) MG TABS Take 1,500 mg by mouth daily with breakfast.    . Cholecalciferol  (VITAMIN D) 2000 UNITS tablet Take 2,000 Units by mouth daily with breakfast.     . cholestyramine (QUESTRAN) 4 GM/DOSE powder Take 2 g by mouth 2 (two) times daily with a meal. (mixed in juice or light kool-aid).    . cloNIDine (CATAPRES) 0.2 MG tablet Take 0.2 mg by mouth 2 (two) times daily.    . clopidogrel (PLAVIX) 75 MG tablet Take 1 tablet (75 mg total) by mouth daily. 90 tablet 2  . DULoxetine (CYMBALTA) 60 MG capsule Take 60 mg by mouth daily with breakfast.    . ezetimibe (ZETIA) 10 MG tablet Take 10 mg by mouth at bedtime.    . Fluticasone-Salmeterol (ADVAIR DISKUS) 250-50 MCG/DOSE AEPB Inhale 1 puff into the lungs every 12 (twelve) hours.     . furosemide (LASIX) 40 MG tablet Take 80 mg by mouth 2 (two) times daily.    Marland Kitchen  gabapentin (NEURONTIN) 600 MG tablet Take 300-600 mg by mouth 3 (three) times daily. 1 tablet AM, 1 tablet at noon and 1 1/2 tablet at bedtime.    Marland Kitchen HUMALOG KWIKPEN 100 UNIT/ML SOPN Inject 10-14 Units into the skin 4 (four) times daily -  with meals and at bedtime. 10 units at every meal and 14 units at bedtime if needed    . hydrALAZINE (APRESOLINE) 50 MG tablet Take 50 mg by mouth 3 (three) times daily as needed (for high blood pressure). If BP is >175    . HYDROcodone-acetaminophen (NORCO/VICODIN) 5-325 MG per tablet Take 1 tablet by mouth at bedtime as needed for moderate pain.     Marland Kitchen ipratropium-albuterol (DUONEB) 0.5-2.5 (3) MG/3ML SOLN Take 3 mLs by nebulization as needed (If severe asthma , use every 4 hours for 10 days).     Marland Kitchen LANTUS SOLOSTAR 100 UNIT/ML Solostar Pen 44 Units daily. Increase/decrease 4 units sugar changes.    Marland Kitchen loperamide (IMODIUM) 2 MG capsule Take 2 mg by mouth as needed for diarrhea or loose stools.    Marland Kitchen losartan (COZAAR) 100 MG tablet Take 100 mg by mouth daily with breakfast.     . Magnesium Oxide 250 MG TABS Take 400 mg by mouth 2 (two) times daily.     . meclizine (ANTIVERT) 12.5 MG tablet Take 12.5 mg by mouth 3 (three) times daily as  needed for dizziness.    . metoprolol succinate (TOPROL-XL) 25 MG 24 hr tablet TAKE ONE TABLET TWICE DAILY 180 tablet 1  . Multiple Vitamin (MULTIVITAMIN) tablet Take 1 tablet by mouth at bedtime.     . nitroGLYCERIN (NITROSTAT) 0.3 MG SL tablet Place 1 tablet (0.3 mg total) under the tongue every 5 (five) minutes x 3 doses as needed for chest pain. 90 tablet 1  . ondansetron (ZOFRAN) 4 MG tablet Take 1 tablet (4 mg total) by mouth 2 (two) times daily as needed. For nausea 20 tablet 0  . pantoprazole (PROTONIX) 40 MG tablet Take 1 tablet (40 mg total) by mouth 2 (two) times daily. 180 tablet 3  . Probiotic Product (PROBIOTIC DAILY PO) Take 1 tablet by mouth 2 (two) times daily.    . ranolazine (RANEXA) 1000 MG SR tablet TAKE ONE (1) TABLET BY MOUTH TWO (2) TIMES DAILY 60 each 6  . rosuvastatin (CRESTOR) 40 MG tablet TAKE ONE (1) TABLET EACH DAY 90 tablet 1  . zinc gluconate 50 MG tablet Take 50 mg by mouth at bedtime.     Marland Kitchen zolpidem (AMBIEN) 10 MG tablet Take 10 mg by mouth at bedtime as needed for sleep.      No current facility-administered medications for this visit.    BP 120/62 mmHg  Pulse 56  Ht 5' 1.75" (1.568 m)  Wt 292 lb (132.45 kg)  BMI 53.87 kg/m2  SpO2 95% General: NAD, obese Neck: JVP 8-9 cm, no thyromegaly or thyroid nodule.  Lungs: Occasional squeaks noted on lung exam.  CV: Nondisplaced PMI.  Heart regular S1/S2 with wide S2 split, no S3/S4, 1/6 early SEM.  1+ edema 1/2 up lower legs bilaterally.  Slight right carotid bruit.  Normal pedal pulses.  Abdomen: Soft, nontender, no hepatosplenomegaly, no distention.  Skin: Intact without lesions or rashes.  Neurologic: Alert and oriented x 3.  Psych: Normal affect. Extremities: No clubbing or cyanosis.   Assessment/Plan: 1. CAD: Occluded native RCA and SVG-RCA.  Now status post opening of CTO RCA with 4 overlapping Xience DES.  She  is now having episodes of exertional chest pain in addition to dyspnea.  I am concerned that  she has worsening coronary disease.  I do not think that stress testing would be particularly useful given typical symptoms and also her body habitus.    - Continue ASA, statin, losartan, Crestor, amlodipine, metoprolol, and ranolazine.  - She will continue Plavix long-term.   - She has not tolerated Imdur in the past. - I will arrange for coronary angiography.  We discussed risks/benefits of the procedure and will set up for next week.   2. Chronic diastolic CHF: She remains volume overloaded by exam despite increased Lasix.  Somewhat worsened NYHA class III symptoms. - Increase Lasix to 80 mg bid.  BMET/BNP today and repeat in 1 week.      - As she is going to have coronary angiography, I will also do a right heart cath at the same time.  I discussed risks/benefits of this with her today as well.  - She will wear graded compression stockings.  3. Hyperlipidemia: She remains on Zetia, Crestor and Praluent.  Excellent lipids in 10/16.   4. Hypertension: BP is controlled. Denies orthostatic symptoms.    5. Cerebrovascular disease: She has history of CVA and had left carotid stent in 2/15. Followed by [facility].  6. Palpitations: Improved on higher Toprol XL.    Followup after cath.   Loralie Champagne 05/22/2015

## 2015-05-28 ENCOUNTER — Other Ambulatory Visit (INDEPENDENT_AMBULATORY_CARE_PROVIDER_SITE_OTHER): Payer: Medicare Other | Admitting: *Deleted

## 2015-05-28 DIAGNOSIS — I5032 Chronic diastolic (congestive) heart failure: Secondary | ICD-10-CM

## 2015-05-28 DIAGNOSIS — R0602 Shortness of breath: Secondary | ICD-10-CM

## 2015-05-28 DIAGNOSIS — I25709 Atherosclerosis of coronary artery bypass graft(s), unspecified, with unspecified angina pectoris: Secondary | ICD-10-CM

## 2015-05-28 LAB — BASIC METABOLIC PANEL
BUN: 26 mg/dL — ABNORMAL HIGH (ref 7–25)
CO2: 32 mmol/L — ABNORMAL HIGH (ref 20–31)
Calcium: 9.3 mg/dL (ref 8.6–10.4)
Chloride: 101 mmol/L (ref 98–110)
Creat: 1.07 mg/dL — ABNORMAL HIGH (ref 0.50–0.99)
Glucose, Bld: 192 mg/dL — ABNORMAL HIGH (ref 65–99)
Potassium: 5.1 mmol/L (ref 3.5–5.3)
Sodium: 141 mmol/L (ref 135–146)

## 2015-05-29 ENCOUNTER — Other Ambulatory Visit (HOSPITAL_COMMUNITY): Payer: Self-pay | Admitting: Cardiology

## 2015-05-29 ENCOUNTER — Encounter (HOSPITAL_COMMUNITY)
Admission: RE | Disposition: A | Discharge status: Home / Self Care | Payer: Self-pay | Source: Ambulatory Visit | Attending: Cardiology

## 2015-05-29 ENCOUNTER — Ambulatory Visit (HOSPITAL_COMMUNITY)
Admission: RE | Admit: 2015-05-29 | Discharge: 2015-05-29 | Disposition: A | Discharge status: Home / Self Care | Payer: Medicare Other | Source: Ambulatory Visit | Attending: Cardiology | Admitting: Cardiology

## 2015-05-29 DIAGNOSIS — G4733 Obstructive sleep apnea (adult) (pediatric): Secondary | ICD-10-CM | POA: Insufficient documentation

## 2015-05-29 DIAGNOSIS — R079 Chest pain, unspecified: Secondary | ICD-10-CM

## 2015-05-29 DIAGNOSIS — E1143 Type 2 diabetes mellitus with diabetic autonomic (poly)neuropathy: Secondary | ICD-10-CM | POA: Diagnosis not present

## 2015-05-29 DIAGNOSIS — K449 Diaphragmatic hernia without obstruction or gangrene: Secondary | ICD-10-CM | POA: Insufficient documentation

## 2015-05-29 DIAGNOSIS — K3184 Gastroparesis: Secondary | ICD-10-CM | POA: Insufficient documentation

## 2015-05-29 DIAGNOSIS — I5032 Chronic diastolic (congestive) heart failure: Secondary | ICD-10-CM | POA: Insufficient documentation

## 2015-05-29 DIAGNOSIS — E785 Hyperlipidemia, unspecified: Secondary | ICD-10-CM | POA: Diagnosis not present

## 2015-05-29 DIAGNOSIS — Z955 Presence of coronary angioplasty implant and graft: Secondary | ICD-10-CM | POA: Insufficient documentation

## 2015-05-29 DIAGNOSIS — F419 Anxiety disorder, unspecified: Secondary | ICD-10-CM | POA: Insufficient documentation

## 2015-05-29 DIAGNOSIS — I2510 Atherosclerotic heart disease of native coronary artery without angina pectoris: Secondary | ICD-10-CM | POA: Diagnosis not present

## 2015-05-29 DIAGNOSIS — Z7951 Long term (current) use of inhaled steroids: Secondary | ICD-10-CM | POA: Diagnosis not present

## 2015-05-29 DIAGNOSIS — Z7902 Long term (current) use of antithrombotics/antiplatelets: Secondary | ICD-10-CM | POA: Diagnosis not present

## 2015-05-29 DIAGNOSIS — Z6841 Body Mass Index (BMI) 40.0 and over, adult: Secondary | ICD-10-CM | POA: Diagnosis not present

## 2015-05-29 DIAGNOSIS — Z7982 Long term (current) use of aspirin: Secondary | ICD-10-CM | POA: Diagnosis not present

## 2015-05-29 DIAGNOSIS — I130 Hypertensive heart and chronic kidney disease with heart failure and stage 1 through stage 4 chronic kidney disease, or unspecified chronic kidney disease: Secondary | ICD-10-CM | POA: Insufficient documentation

## 2015-05-29 DIAGNOSIS — Z951 Presence of aortocoronary bypass graft: Secondary | ICD-10-CM | POA: Diagnosis not present

## 2015-05-29 DIAGNOSIS — E1122 Type 2 diabetes mellitus with diabetic chronic kidney disease: Secondary | ICD-10-CM | POA: Diagnosis not present

## 2015-05-29 DIAGNOSIS — N189 Chronic kidney disease, unspecified: Secondary | ICD-10-CM | POA: Diagnosis not present

## 2015-05-29 DIAGNOSIS — I2582 Chronic total occlusion of coronary artery: Secondary | ICD-10-CM | POA: Insufficient documentation

## 2015-05-29 DIAGNOSIS — Z794 Long term (current) use of insulin: Secondary | ICD-10-CM | POA: Insufficient documentation

## 2015-05-29 DIAGNOSIS — Z8673 Personal history of transient ischemic attack (TIA), and cerebral infarction without residual deficits: Secondary | ICD-10-CM | POA: Diagnosis not present

## 2015-05-29 DIAGNOSIS — Z8249 Family history of ischemic heart disease and other diseases of the circulatory system: Secondary | ICD-10-CM | POA: Diagnosis not present

## 2015-05-29 DIAGNOSIS — K219 Gastro-esophageal reflux disease without esophagitis: Secondary | ICD-10-CM | POA: Insufficient documentation

## 2015-05-29 HISTORY — PX: CARDIAC CATHETERIZATION: SHX172

## 2015-05-29 LAB — GLUCOSE, CAPILLARY
Glucose-Capillary: 255 mg/dL — ABNORMAL HIGH (ref 65–99)
Glucose-Capillary: 151 mg/dL — ABNORMAL HIGH (ref 65–99)

## 2015-05-29 LAB — POCT I-STAT 3, VENOUS BLOOD GAS (G3P V)
Acid-Base Excess: 4 mmol/L — ABNORMAL HIGH (ref 0.0–2.0)
Bicarbonate: 30.3 mEq/L — ABNORMAL HIGH (ref 20.0–24.0)
O2 Saturation: 70 %
TCO2: 32 mmol/L (ref 0–100)
pCO2, Ven: 53.6 mmHg — ABNORMAL HIGH (ref 45.0–50.0)
pH, Ven: 7.359 — ABNORMAL HIGH (ref 7.250–7.300)
pO2, Ven: 39 mmHg (ref 31.0–45.0)

## 2015-05-29 SURGERY — RIGHT/LEFT HEART CATH AND CORONARY/GRAFT ANGIOGRAPHY
Anesthesia: LOCAL

## 2015-05-29 MED ORDER — ACETAMINOPHEN 325 MG PO TABS: 650.0000 mg | ORAL_TABLET | ORAL | Status: DC | PRN

## 2015-05-29 MED ORDER — SODIUM CHLORIDE 0.9% FLUSH: 3.0000 mL | Freq: Two times a day (BID) | INTRAVENOUS | Status: DC

## 2015-05-29 MED ORDER — HEPARIN (PORCINE) IN NACL 2-0.9 UNIT/ML-% IJ SOLN
INTRAMUSCULAR | Status: DC | PRN
  Administered 2015-05-29: 1000 mL

## 2015-05-29 MED ORDER — ONDANSETRON HCL 4 MG/2ML IJ SOLN: 4.0000 mg | Freq: Four times a day (QID) | INTRAMUSCULAR | Status: DC | PRN

## 2015-05-29 MED ORDER — MIDAZOLAM HCL 2 MG/2ML IJ SOLN
INTRAMUSCULAR | Status: DC | PRN
  Administered 2015-05-29: 1 mg via INTRAVENOUS

## 2015-05-29 MED ORDER — ASPIRIN 81 MG PO CHEW: 81.0000 mg | CHEWABLE_TABLET | ORAL | Status: DC

## 2015-05-29 MED ORDER — SODIUM CHLORIDE 0.9 % WEIGHT BASED INFUSION
1.0000 mL/kg/h | INTRAVENOUS | Status: AC
  Administered 2015-05-29: 1 mL/kg/h via INTRAVENOUS

## 2015-05-29 MED ORDER — SODIUM CHLORIDE 0.9 % WEIGHT BASED INFUSION: 1.0000 mL/kg/h | INTRAVENOUS | Status: DC

## 2015-05-29 MED ORDER — SODIUM CHLORIDE 0.9% FLUSH: 3.0000 mL | INTRAVENOUS | Status: DC | PRN

## 2015-05-29 MED ORDER — SODIUM CHLORIDE 0.9 % IV SOLN: 250.0000 mL | INTRAVENOUS | Status: DC | PRN

## 2015-05-29 MED ORDER — ONDANSETRON HCL 4 MG PO TABS
ORAL_TABLET | ORAL | Status: AC
  Filled 2015-05-29: qty 1

## 2015-05-29 MED ORDER — MIDAZOLAM HCL 2 MG/2ML IJ SOLN
INTRAMUSCULAR | Status: AC
  Filled 2015-05-29: qty 2

## 2015-05-29 MED ORDER — HEPARIN (PORCINE) IN NACL 2-0.9 UNIT/ML-% IJ SOLN
INTRAMUSCULAR | Status: AC
  Filled 2015-05-29: qty 1000

## 2015-05-29 MED ORDER — SODIUM CHLORIDE 0.9 % WEIGHT BASED INFUSION
3.0000 mL/kg/h | INTRAVENOUS | Status: DC
  Administered 2015-05-29: 3 mL/kg/h via INTRAVENOUS

## 2015-05-29 MED ORDER — FENTANYL CITRATE (PF) 100 MCG/2ML IJ SOLN
INTRAMUSCULAR | Status: AC
  Filled 2015-05-29: qty 2

## 2015-05-29 MED ORDER — ONDANSETRON HCL 4 MG PO TABS
4.0000 mg | ORAL_TABLET | Freq: Once | ORAL | Status: AC
  Administered 2015-05-29: 4 mg via ORAL

## 2015-05-29 MED ORDER — LIDOCAINE HCL (PF) 1 % IJ SOLN
INTRAMUSCULAR | Status: DC | PRN
  Administered 2015-05-29: 20 mL via SUBCUTANEOUS
  Administered 2015-05-29: 2 mL via SUBCUTANEOUS

## 2015-05-29 MED ORDER — IOPAMIDOL (ISOVUE-370) INJECTION 76%
INTRAVENOUS | Status: DC | PRN
  Administered 2015-05-29: 100 mL via INTRAVENOUS

## 2015-05-29 MED ORDER — FENTANYL CITRATE (PF) 100 MCG/2ML IJ SOLN
INTRAMUSCULAR | Status: DC | PRN
  Administered 2015-05-29: 50 ug via INTRAVENOUS

## 2015-05-29 MED ORDER — LIDOCAINE HCL (PF) 1 % IJ SOLN
INTRAMUSCULAR | Status: AC
  Filled 2015-05-29: qty 30

## 2015-05-29 SURGICAL SUPPLY — 11 items
CATH BALLN WEDGE 5F 110CM (CATHETERS) ×2 IMPLANT
CATH INFINITI 5FR MULTPACK ANG (CATHETERS) ×2 IMPLANT
KIT HEART LEFT (KITS) ×2 IMPLANT
KIT HEART RIGHT NAMIC (KITS) ×2 IMPLANT
PACK CARDIAC CATHETERIZATION (CUSTOM PROCEDURE TRAY) ×2 IMPLANT
SHEATH FAST CATH BRACH 5F 5CM (SHEATH) ×2 IMPLANT
SHEATH PINNACLE 5F 10CM (SHEATH) ×2 IMPLANT
SYR MEDRAD MARK V 150ML (SYRINGE) ×2 IMPLANT
TRANSDUCER W/STOPCOCK (MISCELLANEOUS) ×2 IMPLANT
TUBING CIL FLEX 10 FLL-RA (TUBING) ×2 IMPLANT
WIRE EMERALD 3MM-J .035X150CM (WIRE) ×2 IMPLANT

## 2015-05-29 NOTE — Progress Notes (Signed)
Site area: Right brachial a 5 french venous sheath was removedc  Site Prior to Removal:  Level 0  Pressure Applied For 10 MINUTES    Bedrest Beginning at 1500  Manual:   Yes.    Patient Status During Pull:  stable  Post Pull Groin Site:  Level 0  Post Pull Instructions Given:  Yes.    Post Pull Pulses Present:  Yes.    Dressing Applied:  Yes.    Comments:  VS remain stable during sheath pull.

## 2015-05-29 NOTE — Interval H&P Note (Signed)
Cath Lab Visit (complete for each Cath Lab visit)  Clinical Evaluation Leading to the Procedure:   ACS: No.  Non-ACS:    Anginal Classification: CCS III  Anti-ischemic medical therapy: Maximal Therapy (2 or more classes of medications)  Non-Invasive Test Results: No non-invasive testing performed  Prior CABG: Previous CABG      History and Physical Interval Note:  05/29/2015 12:49 PM  [name] [name]  has presented today for surgery, with the diagnosis of chf  The various methods of treatment have been discussed with the patient and family. After consideration of risks, benefits and other options for treatment, the patient has consented to  Procedure(s): Right/Left Heart Cath and Coronary/Graft Angiography (N/A) as a surgical intervention .  The patient's history has been reviewed, patient examined, no change in status, stable for surgery.  I have reviewed the patient's chart and labs.  Questions were answered to the patient's satisfaction.     [name] [name]

## 2015-05-29 NOTE — Progress Notes (Signed)
Order for sheath removal verified per post procedural orders. Procedure explained to patient and Rt femoral artery access site assessed: level 0, palpable dorsalis pedis and posterior tibial pulses. 5 French arterial sheath removed and manual pressure applied for15 minutes. Pre, peri, & post procedural vitals: HR59, RR 15, O2 Sat upper 95, BP 128/38, Pain 0. Distal pulses remained intact after sheath removal. Access site level 0 and dressed with 4X4 gauze and tegaderm. Post procedural instructions discussed with return demonstration from patient.   Bedrest started at 1500p

## 2015-05-29 NOTE — Discharge Instructions (Signed)
Angiogram, Care After °Refer to this sheet in the next few weeks. These instructions provide you with information about caring for yourself after your procedure. Your health care provider may also give you more specific instructions. Your treatment has been planned according to current medical practices, but problems sometimes occur. Call your health care provider if you have any problems or questions after your procedure. °WHAT TO EXPECT AFTER THE PROCEDURE °After your procedure, it is typical to have the following: °· Bruising at the catheter insertion site that usually fades within 1-2 weeks. °· Blood collecting in the tissue (hematoma) that may be painful to the touch. It should usually decrease in size and tenderness within 1-2 weeks. °HOME CARE INSTRUCTIONS °· Take medicines only as directed by your health care provider. °· You may shower 24-48 hours after the procedure or as directed by your health care provider. Remove the bandage (dressing) and gently wash the site with plain soap and water. Pat the area dry with a clean towel. Do not rub the site, because this may cause bleeding. °· Do not take baths, swim, or use a hot tub until your health care provider approves. °· Check your insertion site every day for redness, swelling, or drainage. °· Do not apply powder or lotion to the site. °· Do not lift over 10 lb (4.5 kg) for 5 days after your procedure or as directed by your health care provider. °· Ask your health care provider when it is okay to: °¨ Return to work or school. °¨ Resume usual physical activities or sports. °¨ Resume sexual activity. °· Do not drive home if you are discharged the same day as the procedure. Have someone else drive you. °· You may drive 24 hours after the procedure unless otherwise instructed by your health care provider. °· Do not operate machinery or power tools for 24 hours after the procedure or as directed by your health care provider. °· If your procedure was done as an  outpatient procedure, which means that you went home the same day as your procedure, a responsible adult should be with you for the first 24 hours after you arrive home. °· Keep all follow-up visits as directed by your health care provider. This is important. °SEEK MEDICAL CARE IF: °· You have a fever. °· You have chills. °· You have increased bleeding from the catheter insertion site. Hold pressure on the site.  CALL 911 °SEEK IMMEDIATE MEDICAL CARE IF: °· You have unusual pain at the catheter insertion site. °· You have redness, warmth, or swelling at the catheter insertion site. °· You have drainage (other than a small amount of blood on the dressing) from the catheter insertion site. °· The catheter insertion site is bleeding, and the bleeding does not stop after 30 minutes of holding steady pressure on the site. °· The area near or just beyond the catheter insertion site becomes pale, cool, tingly, or numb. °  °This information is not intended to replace advice given to you by your health care provider. Make sure you discuss any questions you have with your health care provider. °  °Document Released: 08/06/2004 Document Revised: 02/08/2014 Document Reviewed: 06/21/2012 °[employer] Interactive Patient Education ©2016 [employer] ° °

## 2015-05-29 NOTE — H&P (View-Only) (Signed)
Patient ID: [name] [name], female   DOB: 1945-02-22, 70 y.o.   [name]: [id number] PCP: Dr. Sandi [name]  70 yo with history of CAD s/p CABG, diastolic CHF, and cerebrovascular disease with history of CVA presents for cardiology followup. She had CABG x 5 in 12/11.  Prior to the CABG she had multiple PCIs.  She had a CVA in 2/14 that presented as visual loss.  She had a left carotid stent in 2/15.   Given dyspnea and chest pain that was worsened from her baseline, I took [name] [name] for left heart catheterization in 9/14.  This showed patent SVG-D, LIMA-LAD, and sequential SVG-OM branches but SVG-RCA and the native RCA were both totally occluded.  I suspect that her increased symptoms coincided with occlusion of SVG-RCA.  I started her on Imdur to see if this would help with the chest pain and dyspnea.  However, she feels like Imdur causes leg cramps and does not think that she can take it.  I next started her on ranolazine 500 mg bid and titrated this up to 1000 mg bid.  This helped some but not markedly.  Therefore, I had her see Dr. Irish [name] to address opening RCA CTO. He was able to do this in 5/15 with 4 overlapping Xience DES.  This led to resolution of exertional chest pain.   [name] [name] was started on Brilinta after PCI.  She became much more short of breath after starting on Brilinta.  I had her stop Brilinta and replaced it with Plavix.  Dyspnea improved off Brilinta.   At last appointment, she was more short of breath with exertion and appeared volume overloaded. I increased her Lasix.  Today, she reports ongoing dyspnea, worse despite increased Lasix.  She is short of breath after walking 50-100 feet.  She is short of breath walking up steps.  She has developed chest pressure with walking up steps or walking a long distance.  This is similar to symptoms with prior cardiac ischemia. Weight is up 6 lbs.     Labs (6/14): K 3.8, creatinine 0.71 Labs (8/14): BNP 249, LDL 166 Labs (9/14): K 4.7, creatinine  0.7 Labs (9/14): K 4.6, creatinine 0.9, BNP 109 Labs (1/15): K 4.5, creatinine 0.73, LDL 212, HCT 43.4, TSH normal, BNP 138 Labs (6/15): K 4.7, creatinine 1.17, LDL 100, HDL 37, TGs 363, BNP 39 Labs (10/15): K 4.6, creatinine 1.2 Labs (1/16): LDL 157, HDL 43, K 5.2, creatinine 0.95 Labs (2/16): K 4.5, creatinine 1.08, BNP 113 Labs (4/16): LDL 50, HDL 56, TGs 179 Labs (9/16): K 5.3, creatinine 1.09 Labs (10/16): LDL 75, HDL 56 Labs (2/17): K 5.2, creatinine 1.07  ECG: NSR, LBBB  PMH: 1. Diabetic gastroparesis 2. Type II diabetes 3. HTN 4. Morbid obesity 5. CAD: s/p CABG in 12/11 after prior PCIs.  LIMA-LAD, SVG-D, seq SVG-OM1 and OM2, SVG-PDA. Adenosine Cardiolite (8/14) with EF 53% and a small reversible apical defect with a medium, partially reversible inferior defect.  LHC (9/14) with patent SVG-D, LIMA-LAD (50% distal LAD), and sequential SVG-OM branches; the SVG-RCA and the native RCA were occluded.  This was managed medically initially, but with ongoing exertional chest pain, it was decided to open CTO.  Patient had CTO opening with 4 overlapping Xience DES in the RCA in 5/15.  .  6. Atypical migraines 7. OSA: Intolerant of CPAP.  8. GERD with hiatal hernia 9. OA 10. Diastolic CHF: Echo (A999333) with EF 50-55%, grade II diastolic dysfunction, mild-moderate MR.  Echo (  8/14) with EF 55-60%, grade II diastolic dysfunction, mildly increased aortic valve gradient (mean 12 mmHg) but valve opens well, mild MR and mild RV dilation.  Echo (9/15) with EF 60-65%, grade II diastolic dysfunction, mild aortic stenosis, mild mitral stenosis, mild to moderate mitral regurgitation, mildly dilated RV with normal systolic function, PA systolic pressure 46 mmHg.  11. CKD 12. Chronic LBBB 13. Anxiety 14. Carotid stenosis: Followed by [facility], XX123456 LICA stenosis [id number].  She had left carotid stent in 2/15. Carotid dopplers 8/15 with no significant disease. Carotid dopplers (4/16): < 40% RICA stenosis.  15.  Cerebrovascular disease: CVA 2/14 with right posterior cerebral artery territory ischemic infarction. Cerebral angiogram in 6/14 showed 70% right vertebral ostial stenosis, 123456 LICA stenosis, > [id number] proximal left posterior cerebral artery stenosis, posterior communicating artery aneurysm.  Patient has had episodes of transient expressive aphasia.  Carotid dopplers ([id number]) showed XX123456 LICA stenosis. She had a left carotid stent 2/15.  Possible CVA in 7/15.  16. Positional vertigo (suspected) 17. Palpitations: Holter (6/15) with rare PVCs/PACs.  18. Dyspnea with Brilinta. 19. Melanoma: On face, s/p excision.   20. Aortic stenosis: Mild.  21. Holter (8/16) with no significant arrhythmias.  22. Lower extremity arterial dopplers (9/16) were normal.   SH: Married, nonsmoker  FH: CAD  ROS: All systems reviewed and negative except as per HPI.   Current Outpatient Prescriptions  Medication Sig Dispense Refill  . acetaminophen (TYLENOL) 325 MG tablet Take 650 mg by mouth every 6 (six) hours as needed for mild pain.     Marland Kitchen albuterol (PROVENTIL) (2.5 MG/3ML) 0.083% nebulizer solution Take 2.5 mg by nebulization every 6 (six) hours as needed for shortness of breath (when asthma is active).     Marland Kitchen albuterol (PROVENTIL,VENTOLIN) 90 MCG/ACT inhaler Inhale 2 puffs into the lungs every 6 (six) hours as needed for shortness of breath.     . Alirocumab (PRALUENT) 75 MG/ML SOSY Inject 75 mg into the skin every 14 (fourteen) days. 2 Syringe 11  . ALPRAZolam (XANAX) 0.5 MG tablet Take 0.5 mg by mouth at bedtime as needed for anxiety. For anxiety - Patient takes 1 in the morning, then 1 in the evening, 2 at bedtime    . amLODipine (NORVASC) 10 MG tablet Take 10 mg by mouth daily.    Marland Kitchen aspirin EC 81 MG tablet Take 162 mg by mouth daily.    Marland Kitchen b complex vitamins capsule Take 1 capsule by mouth daily.    . Calcium Carbonate 1500 (600 CA) MG TABS Take 1,500 mg by mouth daily with breakfast.    . Cholecalciferol  (VITAMIN D) 2000 UNITS tablet Take 2,000 Units by mouth daily with breakfast.     . cholestyramine (QUESTRAN) 4 GM/DOSE powder Take 2 g by mouth 2 (two) times daily with a meal. (mixed in juice or light kool-aid).    . cloNIDine (CATAPRES) 0.2 MG tablet Take 0.2 mg by mouth 2 (two) times daily.    . clopidogrel (PLAVIX) 75 MG tablet Take 1 tablet (75 mg total) by mouth daily. 90 tablet 2  . DULoxetine (CYMBALTA) 60 MG capsule Take 60 mg by mouth daily with breakfast.    . ezetimibe (ZETIA) 10 MG tablet Take 10 mg by mouth at bedtime.    . Fluticasone-Salmeterol (ADVAIR DISKUS) 250-50 MCG/DOSE AEPB Inhale 1 puff into the lungs every 12 (twelve) hours.     . furosemide (LASIX) 40 MG tablet Take 80 mg by mouth 2 (two) times daily.    Marland Kitchen  gabapentin (NEURONTIN) 600 MG tablet Take 300-600 mg by mouth 3 (three) times daily. 1 tablet AM, 1 tablet at noon and 1 1/2 tablet at bedtime.    Marland Kitchen HUMALOG KWIKPEN 100 UNIT/ML SOPN Inject 10-14 Units into the skin 4 (four) times daily -  with meals and at bedtime. 10 units at every meal and 14 units at bedtime if needed    . hydrALAZINE (APRESOLINE) 50 MG tablet Take 50 mg by mouth 3 (three) times daily as needed (for high blood pressure). If BP is >175    . HYDROcodone-acetaminophen (NORCO/VICODIN) 5-325 MG per tablet Take 1 tablet by mouth at bedtime as needed for moderate pain.     Marland Kitchen ipratropium-albuterol (DUONEB) 0.5-2.5 (3) MG/3ML SOLN Take 3 mLs by nebulization as needed (If severe asthma , use every 4 hours for 10 days).     Marland Kitchen LANTUS SOLOSTAR 100 UNIT/ML Solostar Pen 44 Units daily. Increase/decrease 4 units sugar changes.    Marland Kitchen loperamide (IMODIUM) 2 MG capsule Take 2 mg by mouth as needed for diarrhea or loose stools.    Marland Kitchen losartan (COZAAR) 100 MG tablet Take 100 mg by mouth daily with breakfast.     . Magnesium Oxide 250 MG TABS Take 400 mg by mouth 2 (two) times daily.     . meclizine (ANTIVERT) 12.5 MG tablet Take 12.5 mg by mouth 3 (three) times daily as  needed for dizziness.    . metoprolol succinate (TOPROL-XL) 25 MG 24 hr tablet TAKE ONE TABLET TWICE DAILY 180 tablet 1  . Multiple Vitamin (MULTIVITAMIN) tablet Take 1 tablet by mouth at bedtime.     . nitroGLYCERIN (NITROSTAT) 0.3 MG SL tablet Place 1 tablet (0.3 mg total) under the tongue every 5 (five) minutes x 3 doses as needed for chest pain. 90 tablet 1  . ondansetron (ZOFRAN) 4 MG tablet Take 1 tablet (4 mg total) by mouth 2 (two) times daily as needed. For nausea 20 tablet 0  . pantoprazole (PROTONIX) 40 MG tablet Take 1 tablet (40 mg total) by mouth 2 (two) times daily. 180 tablet 3  . Probiotic Product (PROBIOTIC DAILY PO) Take 1 tablet by mouth 2 (two) times daily.    . ranolazine (RANEXA) 1000 MG SR tablet TAKE ONE (1) TABLET BY MOUTH TWO (2) TIMES DAILY 60 each 6  . rosuvastatin (CRESTOR) 40 MG tablet TAKE ONE (1) TABLET EACH DAY 90 tablet 1  . zinc gluconate 50 MG tablet Take 50 mg by mouth at bedtime.     Marland Kitchen zolpidem (AMBIEN) 10 MG tablet Take 10 mg by mouth at bedtime as needed for sleep.      No current facility-administered medications for this visit.    BP 120/62 mmHg  Pulse 56  Ht 5' 1.75" (1.568 m)  Wt 292 lb (132.45 kg)  BMI 53.87 kg/m2  SpO2 95% General: NAD, obese Neck: JVP 8-9 cm, no thyromegaly or thyroid nodule.  Lungs: Occasional squeaks noted on lung exam.  CV: Nondisplaced PMI.  Heart regular S1/S2 with wide S2 split, no S3/S4, 1/6 early SEM.  1+ edema 1/2 up lower legs bilaterally.  Slight right carotid bruit.  Normal pedal pulses.  Abdomen: Soft, nontender, no hepatosplenomegaly, no distention.  Skin: Intact without lesions or rashes.  Neurologic: Alert and oriented x 3.  Psych: Normal affect. Extremities: No clubbing or cyanosis.   Assessment/Plan: 1. CAD: Occluded native RCA and SVG-RCA.  Now status post opening of CTO RCA with 4 overlapping Xience DES.  She  is now having episodes of exertional chest pain in addition to dyspnea.  I am concerned that  she has worsening coronary disease.  I do not think that stress testing would be particularly useful given typical symptoms and also her body habitus.    - Continue ASA, statin, losartan, Crestor, amlodipine, metoprolol, and ranolazine.  - She will continue Plavix long-term.   - She has not tolerated Imdur in the past. - I will arrange for coronary angiography.  We discussed risks/benefits of the procedure and will set up for next week.   2. Chronic diastolic CHF: She remains volume overloaded by exam despite increased Lasix.  Somewhat worsened NYHA class III symptoms. - Increase Lasix to 80 mg bid.  BMET/BNP today and repeat in 1 week.      - As she is going to have coronary angiography, I will also do a right heart cath at the same time.  I discussed risks/benefits of this with her today as well.  - She will wear graded compression stockings.  3. Hyperlipidemia: She remains on Zetia, Crestor and Praluent.  Excellent lipids in 10/16.   4. Hypertension: BP is controlled. Denies orthostatic symptoms.    5. Cerebrovascular disease: She has history of CVA and had left carotid stent in 2/15. Followed by [facility].  6. Palpitations: Improved on higher Toprol XL.    Followup after cath.   Loralie Champagne 05/22/2015

## 2015-05-30 ENCOUNTER — Encounter (HOSPITAL_COMMUNITY): Payer: Self-pay | Admitting: Cardiology

## 2015-06-04 ENCOUNTER — Ambulatory Visit (HOSPITAL_COMMUNITY): Payer: Medicare Other | Attending: Internal Medicine

## 2015-06-04 ENCOUNTER — Other Ambulatory Visit: Payer: Self-pay

## 2015-06-04 DIAGNOSIS — E785 Hyperlipidemia, unspecified: Secondary | ICD-10-CM | POA: Insufficient documentation

## 2015-06-04 DIAGNOSIS — R0602 Shortness of breath: Secondary | ICD-10-CM | POA: Diagnosis not present

## 2015-06-04 DIAGNOSIS — I071 Rheumatic tricuspid insufficiency: Secondary | ICD-10-CM | POA: Diagnosis not present

## 2015-06-04 DIAGNOSIS — I252 Old myocardial infarction: Secondary | ICD-10-CM | POA: Diagnosis not present

## 2015-06-04 DIAGNOSIS — G4733 Obstructive sleep apnea (adult) (pediatric): Secondary | ICD-10-CM | POA: Diagnosis not present

## 2015-06-04 DIAGNOSIS — I509 Heart failure, unspecified: Secondary | ICD-10-CM | POA: Diagnosis present

## 2015-06-04 DIAGNOSIS — I110 Hypertensive heart disease with heart failure: Secondary | ICD-10-CM | POA: Insufficient documentation

## 2015-06-04 DIAGNOSIS — E119 Type 2 diabetes mellitus without complications: Secondary | ICD-10-CM | POA: Diagnosis not present

## 2015-06-04 DIAGNOSIS — I371 Nonrheumatic pulmonary valve insufficiency: Secondary | ICD-10-CM | POA: Insufficient documentation

## 2015-06-04 DIAGNOSIS — I25709 Atherosclerosis of coronary artery bypass graft(s), unspecified, with unspecified angina pectoris: Secondary | ICD-10-CM | POA: Diagnosis not present

## 2015-06-04 DIAGNOSIS — I5032 Chronic diastolic (congestive) heart failure: Secondary | ICD-10-CM | POA: Diagnosis not present

## 2015-06-04 DIAGNOSIS — I340 Nonrheumatic mitral (valve) insufficiency: Secondary | ICD-10-CM | POA: Diagnosis not present

## 2015-06-11 ENCOUNTER — Other Ambulatory Visit: Payer: Self-pay | Admitting: Internal Medicine

## 2015-06-12 NOTE — Addendum Note (Signed)
Addended by: Dorothyann Gibbs on: 06/12/2015 08:48 AM   Modules accepted: Orders

## 2015-06-24 ENCOUNTER — Encounter: Payer: Self-pay | Admitting: Dietician

## 2015-06-24 ENCOUNTER — Encounter: Payer: Medicare Other | Attending: Cardiology | Admitting: Dietician

## 2015-06-24 ENCOUNTER — Encounter (HOSPITAL_COMMUNITY): Payer: Self-pay

## 2015-06-24 ENCOUNTER — Ambulatory Visit (HOSPITAL_COMMUNITY)
Admission: RE | Admit: 2015-06-24 | Discharge: 2015-06-24 | Disposition: A | Discharge status: Home / Self Care | Payer: Medicare Other | Source: Ambulatory Visit | Attending: Cardiology | Admitting: Cardiology

## 2015-06-24 VITALS — Wt 288.0 lb

## 2015-06-24 VITALS — BP 118/74 | HR 64 | Wt 288.5 lb

## 2015-06-24 DIAGNOSIS — N189 Chronic kidney disease, unspecified: Secondary | ICD-10-CM | POA: Diagnosis not present

## 2015-06-24 DIAGNOSIS — Z7982 Long term (current) use of aspirin: Secondary | ICD-10-CM | POA: Insufficient documentation

## 2015-06-24 DIAGNOSIS — K219 Gastro-esophageal reflux disease without esophagitis: Secondary | ICD-10-CM | POA: Insufficient documentation

## 2015-06-24 DIAGNOSIS — I129 Hypertensive chronic kidney disease with stage 1 through stage 4 chronic kidney disease, or unspecified chronic kidney disease: Secondary | ICD-10-CM | POA: Diagnosis not present

## 2015-06-24 DIAGNOSIS — I2510 Atherosclerotic heart disease of native coronary artery without angina pectoris: Secondary | ICD-10-CM | POA: Diagnosis not present

## 2015-06-24 DIAGNOSIS — R002 Palpitations: Secondary | ICD-10-CM | POA: Diagnosis not present

## 2015-06-24 DIAGNOSIS — E118 Type 2 diabetes mellitus with unspecified complications: Secondary | ICD-10-CM | POA: Diagnosis not present

## 2015-06-24 DIAGNOSIS — Z85820 Personal history of malignant melanoma of skin: Secondary | ICD-10-CM | POA: Diagnosis not present

## 2015-06-24 DIAGNOSIS — Z955 Presence of coronary angioplasty implant and graft: Secondary | ICD-10-CM | POA: Insufficient documentation

## 2015-06-24 DIAGNOSIS — Z794 Long term (current) use of insulin: Secondary | ICD-10-CM | POA: Diagnosis not present

## 2015-06-24 DIAGNOSIS — G4733 Obstructive sleep apnea (adult) (pediatric): Secondary | ICD-10-CM | POA: Diagnosis not present

## 2015-06-24 DIAGNOSIS — I25111 Atherosclerotic heart disease of native coronary artery with angina pectoris with documented spasm: Secondary | ICD-10-CM | POA: Diagnosis not present

## 2015-06-24 DIAGNOSIS — K449 Diaphragmatic hernia without obstruction or gangrene: Secondary | ICD-10-CM | POA: Insufficient documentation

## 2015-06-24 DIAGNOSIS — F419 Anxiety disorder, unspecified: Secondary | ICD-10-CM | POA: Insufficient documentation

## 2015-06-24 DIAGNOSIS — Z713 Dietary counseling and surveillance: Secondary | ICD-10-CM | POA: Insufficient documentation

## 2015-06-24 DIAGNOSIS — Z8249 Family history of ischemic heart disease and other diseases of the circulatory system: Secondary | ICD-10-CM | POA: Diagnosis not present

## 2015-06-24 DIAGNOSIS — IMO0002 Reserved for concepts with insufficient information to code with codable children: Secondary | ICD-10-CM

## 2015-06-24 DIAGNOSIS — Z8673 Personal history of transient ischemic attack (TIA), and cerebral infarction without residual deficits: Secondary | ICD-10-CM | POA: Diagnosis not present

## 2015-06-24 DIAGNOSIS — Z6841 Body Mass Index (BMI) 40.0 and over, adult: Secondary | ICD-10-CM | POA: Insufficient documentation

## 2015-06-24 DIAGNOSIS — Z951 Presence of aortocoronary bypass graft: Secondary | ICD-10-CM | POA: Diagnosis not present

## 2015-06-24 DIAGNOSIS — E1143 Type 2 diabetes mellitus with diabetic autonomic (poly)neuropathy: Secondary | ICD-10-CM | POA: Diagnosis not present

## 2015-06-24 DIAGNOSIS — I5032 Chronic diastolic (congestive) heart failure: Secondary | ICD-10-CM | POA: Insufficient documentation

## 2015-06-24 DIAGNOSIS — E785 Hyperlipidemia, unspecified: Secondary | ICD-10-CM | POA: Insufficient documentation

## 2015-06-24 DIAGNOSIS — Z79899 Other long term (current) drug therapy: Secondary | ICD-10-CM | POA: Diagnosis not present

## 2015-06-24 DIAGNOSIS — E1165 Type 2 diabetes mellitus with hyperglycemia: Secondary | ICD-10-CM

## 2015-06-24 LAB — BRAIN NATRIURETIC PEPTIDE: B Natriuretic Peptide: 69.8 pg/mL (ref 0.0–100.0)

## 2015-06-24 LAB — LIPID PANEL
Cholesterol: 183 mg/dL (ref 0–200)
HDL: 56 mg/dL (ref >40)
LDL Cholesterol: 96 mg/dL (ref 0–99)
Total CHOL/HDL Ratio: 3.3 RATIO
Triglycerides: 156 mg/dL — ABNORMAL HIGH (ref <150)
VLDL: 31 mg/dL (ref 0–40)

## 2015-06-24 LAB — BASIC METABOLIC PANEL
Anion gap: 10 (ref 5–15)
BUN: 20 mg/dL (ref 6–20)
CO2: 27 mmol/L (ref 22–32)
Calcium: 9.4 mg/dL (ref 8.9–10.3)
Chloride: 103 mmol/L (ref 101–111)
Creatinine, Ser: 1.01 mg/dL — ABNORMAL HIGH (ref 0.44–1.00)
GFR calc Af Amer: >60 mL/min (ref >60)
GFR calc non Af Amer: 56 mL/min — ABNORMAL LOW (ref >60)
Glucose, Bld: 184 mg/dL — ABNORMAL HIGH (ref 65–99)
Potassium: 4.5 mmol/L (ref 3.5–5.1)
Sodium: 140 mmol/L (ref 135–145)

## 2015-06-24 NOTE — Progress Notes (Signed)
Patient ID: [name] [name], female   DOB: [birth year], 70 y.o.   [name]: [id number] PCP: Dr. Sandi Mariscal  70 yo with history of CAD s/p CABG, diastolic CHF, and cerebrovascular disease with history of CVA presents for cardiology followup. She had CABG x 5 in 12/11.  Prior to the CABG she had multiple PCIs.  She had a CVA in 2/14 that presented as visual loss.  She had a left carotid stent in 2/15.   Given dyspnea and chest pain that was worsened from her baseline, I took [name] [name] for left heart catheterization in 9/14.  This showed patent SVG-D, LIMA-LAD, and sequential SVG-OM branches but SVG-RCA and the native RCA were both totally occluded.  I suspect that her increased symptoms coincided with occlusion of SVG-RCA.  I started her on Imdur to see if this would help with the chest pain and dyspnea.  However, she feels like Imdur causes leg cramps and does not think that she can take it.  I next started her on ranolazine 500 mg bid and titrated this up to 1000 mg bid.  This helped some but not markedly.  Therefore, I had her see Dr. Irish Lack to address opening RCA CTO. He was able to do this in 5/15 with 4 overlapping Xience DES.  This led to resolution of exertional chest pain.   [name] [name] was started on Brilinta after PCI.  She became much more short of breath after starting on Brilinta.  I had her stop Brilinta and replaced it with Plavix.  Dyspnea improved off Brilinta.   Given increasing exertional dyspnea and chest pain, I did RHC/LHC in 4/15.  This showed stable CAD with no interventional target.  Left and right heart filling pressures were not significantly increased.  Medical management.  She is still short of breath after walking about 100 feet or going up steps.  She has atypical chest pain (lower chest/epigastric pain usually when lying in bed or sitting in chair, not with exertion.  Poor energy, fatigue.  +cough.  Weight is down 4 lbs.   Labs (6/14): K 3.8, creatinine 0.71 Labs (8/14): BNP 249, LDL  166 Labs (9/14): K 4.7, creatinine 0.7 Labs (9/14): K 4.6, creatinine 0.9, BNP 109 Labs (1/15): K 4.5, creatinine 0.73, LDL 212, HCT 43.4, TSH normal, BNP 138 Labs (6/15): K 4.7, creatinine 1.17, LDL 100, HDL 37, TGs 363, BNP 39 Labs (10/15): K 4.6, creatinine 1.2 Labs (1/16): LDL 157, HDL 43, K 5.2, creatinine 0.95 Labs (2/16): K 4.5, creatinine 1.08, BNP 113 Labs (4/16): LDL 50, HDL 56, TGs 179 Labs (9/16): K 5.3, creatinine 1.09 Labs (10/16): LDL 75, HDL 56 Labs (2/17): K 5.2, creatinine 1.07 Labs (4/17): K 5.1, creatinine 1.07  PMH: 1. Diabetic gastroparesis 2. Type II diabetes 3. HTN 4. Morbid obesity 5. CAD: s/p CABG in 12/11 after prior PCIs.  LIMA-LAD, SVG-D, seq SVG-OM1 and OM2, SVG-PDA. Adenosine Cardiolite (8/14) with EF 53% and a small reversible apical defect with a medium, partially reversible inferior defect.  LHC (9/14) with patent SVG-D, LIMA-LAD (50% distal LAD), and sequential SVG-OM branches; the SVG-RCA and the native RCA were occluded.  This was managed medically initially, but with ongoing exertional chest pain, it was decided to open CTO.  Patient had CTO opening with 4 overlapping Xience DES in the RCA in 5/15.   - LHC (4/17): SVG-D patent, LIMA-LAD patent, distal LAD with several 40-50% stenoses, sequential SVG-OM1 and PLOM patent with 50% proximal stenosis (not flow limiting), SVG-RCA TO  with patent RCA stents.  6. Atypical migraines 7. OSA: Intolerant of CPAP.  8. GERD with hiatal hernia 9. OA 10. Diastolic CHF: Echo (A999333) with EF 50-55%, grade II diastolic dysfunction, mild-moderate MR.  Echo (8/14) with EF 55-60%, grade II diastolic dysfunction, mildly increased aortic valve gradient (mean 12 mmHg) but valve opens well, mild MR and mild RV dilation.  Echo (9/15) with EF 60-65%, grade II diastolic dysfunction, mild aortic stenosis, mild mitral stenosis, mild to moderate mitral regurgitation, mildly dilated RV with normal systolic function, PA systolic pressure  46 mmHg.  - RHC (4/17): mean RA 9, PA 38/15 mean 27, mean PCWP 9, CI 2.5.  - Echo (5/17): EF 55-60%, mild LVH, mildly dilated RV with low normal systolic function, moderate TR, PASP 61 mmHg 11. CKD 12. Chronic LBBB 13. Anxiety 14. Carotid stenosis: Followed by [facility], XX123456 LICA stenosis [id number].  She had left carotid stent in 2/15. Carotid dopplers 8/15 with no significant disease. Carotid dopplers (4/16): < 40% RICA stenosis.  15. Cerebrovascular disease: CVA 2/14 with right posterior cerebral artery territory ischemic infarction. Cerebral angiogram in 6/14 showed 70% right vertebral ostial stenosis, 123456 LICA stenosis, > XX123456 proximal left posterior cerebral artery stenosis, posterior communicating artery aneurysm.  Patient has had episodes of transient expressive aphasia.  Carotid dopplers (XX123456) showed XX123456 LICA stenosis. She had a left carotid stent 2/15.  Possible CVA in 7/15.  16. Positional vertigo (suspected) 17. Palpitations: Holter (6/15) with rare PVCs/PACs.  18. Dyspnea with Brilinta. 19. Melanoma: On face, s/p excision.   20. Aortic stenosis: Mild.  21. Holter (8/16) with no significant arrhythmias.  22. Lower extremity arterial dopplers (9/16) were normal.   SH: Married, nonsmoker  FH: CAD  ROS: All systems reviewed and negative except as per HPI.   Current Outpatient Prescriptions  Medication Sig Dispense Refill  . acetaminophen (TYLENOL) 325 MG tablet Take 650 mg by mouth every 6 (six) hours as needed for mild pain.     Marland Kitchen albuterol (PROVENTIL) (2.5 MG/3ML) 0.083% nebulizer solution Take 2.5 mg by nebulization every 6 (six) hours as needed for shortness of breath (when asthma is active).     Marland Kitchen albuterol (PROVENTIL,VENTOLIN) 90 MCG/ACT inhaler Inhale 2 puffs into the lungs every 6 (six) hours as needed for shortness of breath.     . Alirocumab (PRALUENT) 75 MG/ML SOSY Inject 75 mg into the skin every 14 (fourteen) days. 2 Syringe 11  . ALPRAZolam (XANAX) 0.5 MG tablet Take  0.5-1 mg by mouth 3 (three) times daily. 0.5mg  in the morning and at lunch, 1mg  at bedtime    . amLODipine (NORVASC) 10 MG tablet Take 10 mg by mouth daily.    Marland Kitchen aspirin EC 81 MG tablet Take 162 mg by mouth daily.    Marland Kitchen b complex vitamins capsule Take 1 capsule by mouth daily.    . Calcium Carbonate 1500 (600 CA) MG TABS Take 1,500 mg by mouth daily with breakfast.    . Cholecalciferol (VITAMIN D) 2000 UNITS tablet Take 2,000 Units by mouth daily with breakfast.     . cholestyramine (QUESTRAN) 4 GM/DOSE powder Take 2 g by mouth 2 (two) times daily with a meal. (mixed in juice or light kool-aid).    . cloNIDine (CATAPRES) 0.2 MG tablet Take 0.2 mg by mouth 2 (two) times daily.    . clopidogrel (PLAVIX) 75 MG tablet Take 1 tablet (75 mg total) by mouth daily. 90 tablet 2  . denosumab (PROLIA) 60 MG/ML SOLN injection  Inject 60 mg into the skin every 6 (six) months. Administer in upper arm, thigh, or abdomen    . DULoxetine (CYMBALTA) 60 MG capsule Take 60 mg by mouth daily with breakfast.    . ezetimibe (ZETIA) 10 MG tablet Take 10 mg by mouth at bedtime.    . Fluticasone-Salmeterol (ADVAIR DISKUS) 250-50 MCG/DOSE AEPB Inhale 1 puff into the lungs every 12 (twelve) hours.     . furosemide (LASIX) 40 MG tablet Take 80 mg by mouth 2 (two) times daily.    Marland Kitchen gabapentin (NEURONTIN) 600 MG tablet Take 600-750 mg by mouth 3 (three) times daily. 600mg  in the morning and at noon. 750mg  at bedtime.    Marland Kitchen HUMALOG KWIKPEN 100 UNIT/ML SOPN Inject 10-14 Units into the skin 4 (four) times daily -  with meals and at bedtime. 10 units at every meal and 14 units at bedtime if needed    . hydrALAZINE (APRESOLINE) 50 MG tablet Take 50 mg by mouth 3 (three) times daily as needed (for high blood pressure). If BP is >175    . HYDROcodone-acetaminophen (NORCO/VICODIN) 5-325 MG per tablet Take 1 tablet by mouth at bedtime as needed for moderate pain.     Marland Kitchen ipratropium-albuterol (DUONEB) 0.5-2.5 (3) MG/3ML SOLN Take 3 mLs by  nebulization as needed (If severe asthma , use every 4 hours for 10 days).     Marland Kitchen LANTUS SOLOSTAR 100 UNIT/ML Solostar Pen Inject 44 Units into the skin every morning.     . loperamide (IMODIUM) 2 MG capsule Take 2 mg by mouth as needed for diarrhea or loose stools.    Marland Kitchen losartan (COZAAR) 100 MG tablet Take 100 mg by mouth daily with breakfast.     . magnesium oxide (MAG-OX) 400 MG tablet Take 400 mg by mouth 2 (two) times daily.    . meclizine (ANTIVERT) 12.5 MG tablet Take 12.5 mg by mouth 3 (three) times daily as needed for dizziness.    . metoprolol succinate (TOPROL-XL) 25 MG 24 hr tablet TAKE ONE TABLET TWICE DAILY 180 tablet 1  . Multiple Vitamin (MULTIVITAMIN) tablet Take 1 tablet by mouth at bedtime.     . nitroGLYCERIN (NITROSTAT) 0.3 MG SL tablet Place 1 tablet (0.3 mg total) under the tongue every 5 (five) minutes x 3 doses as needed for chest pain. 90 tablet 1  . ondansetron (ZOFRAN) 4 MG tablet Take 1 tablet (4 mg total) by mouth 2 (two) times daily as needed. For nausea (Patient taking differently: Take 4 mg by mouth 2 (two) times daily. For nausea) 20 tablet 0  . ondansetron (ZOFRAN) 4 MG tablet TAKE 1 TABLET BY MOUTH EVERY 4 HOURS AS NEEDED FOR NAUSEA OR VOMITING 120 tablet 1  . pantoprazole (PROTONIX) 40 MG tablet Take 1 tablet (40 mg total) by mouth 2 (two) times daily. 180 tablet 3  . Probiotic Product (PROBIOTIC DAILY PO) Take 1 tablet by mouth 2 (two) times daily.    . ranolazine (RANEXA) 1000 MG SR tablet TAKE ONE (1) TABLET BY MOUTH TWO (2) TIMES DAILY 60 each 6  . rosuvastatin (CRESTOR) 40 MG tablet TAKE ONE (1) TABLET EACH DAY 90 tablet 1  . zinc gluconate 50 MG tablet Take 50 mg by mouth at bedtime.     Marland Kitchen zolpidem (AMBIEN) 10 MG tablet Take 10 mg by mouth at bedtime as needed for sleep.      No current facility-administered medications for this encounter.    BP 118/74 mmHg  Pulse 64  Wt 288 lb 8 oz (130.863 kg)  SpO2 97% General: NAD, obese Neck: JVP 7 cm, no  thyromegaly or thyroid nodule.  Lungs: Occasional squeaks noted on lung exam.  CV: Nondisplaced PMI.  Heart regular S1/S2 with wide S2 split, no S3/S4, 1/6 early SEM.  Trace ankle edema.  Slight right carotid bruit.  Normal pedal pulses.  Abdomen: Soft, nontender, no hepatosplenomegaly, no distention.  Skin: Intact without lesions or rashes.  Neurologic: Alert and oriented x 3.  Psych: Normal affect. Extremities: No clubbing or cyanosis.   Assessment/Plan: 1. CAD: Occluded native RCA and SVG-RCA.  Now status post opening of CTO RCA with 4 overlapping Xience DES.  Most recent cath in 4/17 showed patent RCA stents, no interventional target.  - Continue ASA, statin, losartan, Crestor, amlodipine, metoprolol, and ranolazine.  - She will continue Plavix long-term.   - She has not tolerated Imdur in the past. - I would like for her to start cardiac rehab, will refer.  2. Chronic diastolic CHF: On exam, volume looks ok.  RHC did not suggest marked volume overload.  Suspect obesity/deconditioning plays a large role. - Continue Lasix 80 mg bid.  BMET/BNP today.      3. Hyperlipidemia: She remains on Zetia, Crestor and Praluent.  Check lipids.   4. Hypertension: BP is controlled. Denies orthostatic symptoms.    5. Cerebrovascular disease: She has history of CVA and had left carotid stent in 2/15. Followed by [facility].  6. Palpitations: Improved on higher Toprol XL.    Followup in 3 months.    Loralie Champagne 06/24/2015

## 2015-06-24 NOTE — Patient Instructions (Signed)
Labs today  You have been referred to Cardiac Rehab   We will contact you in 3 months to schedule your next appointment.

## 2015-06-24 NOTE — Progress Notes (Signed)
Medical Nutrition Therapy:  Appt start time: 1200  end time:  1230.   Assessment:  Primary concerns today: Patient is here today for a follow up.  She would like to lose weight.  She states that "she hates herself" and that weight is a big part of this.   Type 2 diagnosed in 2001. Other hx includes stage 3 CKD (potassium 5.1 and GFR 43.8 10/23/14- she is followed by Dr. Lorrene Reid), CVA 2013 and 2015, MI 2001, CHF, and multiple stents. Hx also includes Gastric bypass surgery "that did not work". She reports severe vomiting at times and has slow emptying. Hernia surgery in past but feels that it has returned. No appetite and eats no breakfast. From her history, intake is not excessive. She follows a low sodium diet.  She reported that one day she did not eat or drink until 5:00pm.  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  She has made some changes in the past month, trying romaine, which she enjoys as well as continuing to incorporate salmon, tuna, and dried beans in her diet.  She has also enjoyed the Mrs. Dash seasoning products.She has been going to rehab and this has reduced her pain and they are working to improve her walking.  They asked that she go to the gym a couple of days per week in addition to rehab and she is considering using the pool there.  She wants her husband to take time off work to go with her.  She has gained a couple lbs since last visit.  Still complains of increased fluid gain/swelling.  Further discussion of her diet reveals use of regular bullion cube to season.  Discussed further need to avoid any added sodium as well as importance of improving her fluid intake and nutrition.  She states that she feels that she needs to see a Social worker.  She states that MD stated that she does not need to take the Metformin or Vitamin D at this time.  Current labs unavailable for  this visit.  Patient is here alone. She is retired and lives with her husband. Weight has always been a struggle. Lowest of 200-225 until 2001, 250 lbs UBW until 6-8 months ago then became ill, was on IV antibiotics and very poor energy for 4 moths and was unable to exercise and is at 283 lbs today. She is frustrated by her continued weight gain, has a poor body image, and depression due to health and other underlying causes. We discussed this. She is a member of a sports center 3 miles from her home but does not go.   03/11/15: Patient is here alone for nutrition follow up of diabetes and weight.  She continues to skip meals and reported only 3 cups of fluid intake yesterday.  Her weight has increased 2 lbs since last visit but this may also be fluid.  Her protein intake is inadequate often.  She continues to state that she hates herself and her body.   She states that her urine is very dark.  MD increased lasix but she is not urinating much.  She reports sleeping only 2 hours per night despite xanax and Ambien.   She has OSA but states that she cannot tolerate the C-pap mask.  She is having difficulty also dealing with decreasing health with aging.    04/14/15: Patient is here alone for  follow up for nutrition for diabetes and weight.  She is doing better with drinking more fluid and eating small amounts throughout the day but is still not eating significant amounts per patient.  She also gets discouraged and fearful to eat because of her weight.  Today's weight is 280 lbs decreased from 287 lbs at our last visit 03/11/15.  She states that the loss is due to fluid loss.  She is being careful about the sodium content of foods.  She remains depressed and is more open to counseling at this time.  She has started Jardiance for her blood sugar but states that she may not be able to continue this due to "feeling sick and tired due to the side effects of the medications".    05/20/15: Patient is here alone.   She was not able to find a counselor that her insurance accepted.  Her husband was out of town for his job for 3 weeks.  She is more depressed and lonely during this time.  She states that she does not eat supper well when he is not there.  She states that she has felt worse mentally over the past month.  She verbalized her self hate due to her weight and overall unhappiness.  292 lbs today.  06/24/15: Patient is here along.  She was going to call about counseling the bun had a heart cath.  Small vessels blocked and she will be monitored.  She has been having increased problems with fluid retention.  She reports following a low sodium diet and is taking lasix. She has been referred to Cardiac Rehab.  Her weight was 288 lbs (decreased) this am at MD appointment.  She has been vomiting more possibly due to her gastric bypass surgery.  She reports increased blood sugar readings recently greater than 200.  She reports increased stress and pain.  She reports taking her insulin as prescribed.  She verbalized continued lack of desire to get out of bed and depression.   Preferred Learning Style:   No preference indicated   Learning Readiness:   Ready  Change in progress  MEDICATIONS: see list   DIETARY INTAKE:  Usual eating pattern includes 1-2 meals and 1-2 snacks per day. Avoided foods include Many vegetables taste bitter to her.    24-hr recall:  B ( AM): yogurt Snk ( AM): often skips  L ( PM): raw veges Snk ( PM): nuts D ( PM): salad with tuna, tomatoes, raspberry vinegrette Snk ( PM):  Beverages: coffee, tea "hates water"  Usual physical activity: current physical rehab  Estimated energy needs: 1200 calories 135 g carbohydrates 90 g protein 33 g fat  Progress Towards Goal(s):  In progress.   Nutritional Diagnosis:  NB-1.1 Food and nutrition-related knowledge deficit As related to balance of energy intake/expenditure,sodium needs, and basic nutrition.  As evidenced by patient  report and weight gain..    Intervention:  Nutrition counseling/education continued for body acceptance, low sodium guidelines, and importance of maintaining nutrition and hydration.  Discussed importance of adequate nutrition as poor nutrition can increase fluid retention as well.  We discussed the importance of caring for herself.  Discussed benefits of getting out more.  Provided her the card for [facility] at last visit.  Continued to encourage her to consider counseling.  An MD referral is needed prior to the next visit.  She also can see RD at Cardiac rehab.  Provided my card.  Patient can e-mail with questions.  Stay  hydrated. Continue to follow a low sodium diet. Drink water first thing when you get up. Eat within one hour of waking Breakfast, lunch, dinner daily Small snacks when hungry. Listen to your body Small amount of protein each time you eat. Aim for 3 things per meal (1/2 sandwich, veges, fruit for example)   (30 grams of carbohydrates per meal and 15 grams of carbohydrates per snack)  Inquire about counseling.  Teaching Method Utilized:  Visual Auditory  Handouts given during visit include: Breakfast ideas, my plate, counseling list again per her request  Barriers to learning/adherence to lifestyle change: physical challenges and depression.  Demonstrated degree of understanding via:  Teach Back   Monitoring/Evaluation:  Dietary intake, exercise, label reading, and body weight prn.

## 2015-06-24 NOTE — Patient Instructions (Signed)
Stay hydrated. Continue to follow a low sodium diet. Drink water first thing when you get up. Eat within one hour of waking Breakfast, lunch, dinner daily Small snacks when hungry. Listen to your body Small amount of protein each time you eat. Aim for 3 things per meal (1/2 sandwich, veges, fruit for example)   (30 grams of carbohydrates per meal and 15 grams of carbohydrates per snack)  Inquire about counseling.

## 2015-07-03 ENCOUNTER — Telehealth (HOSPITAL_COMMUNITY): Payer: Self-pay | Admitting: *Deleted

## 2015-07-03 NOTE — Telephone Encounter (Signed)
Notes Recorded by Harvie Junior, CMA on 07/03/2015 at 9:57 AM Spoke to patient she is aware of results. She said she is taking all 3 medications but she had an issue with injecting praluent (wasnt injecting all medication). During her last office visit Gay Filler taught her how to use it correctly. She was not receiving the medication properly prior to her last office visit. Notes Recorded by Harvie Junior, CMA on 06/27/2015 at 3:40 PM Left vm for pt to call back for lab results Notes Recorded by Larey Dresser, MD on 06/27/2015 at 11:51 AM LDL is a little higher. Make sure she is taking Zetia, Crestor and Praluent. Notes Recorded by Luanna Salk, LPN on [id number] at [id number] AM Preliminary reviewed and await provider signature

## 2015-07-11 ENCOUNTER — Other Ambulatory Visit: Payer: Self-pay | Admitting: Cardiology

## 2015-07-14 ENCOUNTER — Encounter: Payer: Self-pay | Admitting: Internal Medicine

## 2015-08-15 ENCOUNTER — Other Ambulatory Visit: Payer: Self-pay | Admitting: Internal Medicine

## 2015-08-15 ENCOUNTER — Other Ambulatory Visit: Payer: Self-pay | Admitting: Cardiology

## 2015-09-12 ENCOUNTER — Other Ambulatory Visit: Payer: Self-pay | Admitting: Cardiology

## 2015-09-12 ENCOUNTER — Other Ambulatory Visit: Payer: Self-pay | Admitting: Internal Medicine

## 2015-10-10 ENCOUNTER — Other Ambulatory Visit: Payer: Self-pay | Admitting: Internal Medicine

## 2015-10-10 ENCOUNTER — Other Ambulatory Visit (HOSPITAL_COMMUNITY): Payer: Self-pay | Admitting: Cardiology

## 2015-11-05 ENCOUNTER — Other Ambulatory Visit: Payer: Self-pay | Admitting: Internal Medicine

## 2015-12-02 ENCOUNTER — Other Ambulatory Visit: Payer: Self-pay | Admitting: Cardiology

## 2015-12-10 ENCOUNTER — Other Ambulatory Visit: Payer: Self-pay | Admitting: Internal Medicine

## 2015-12-10 ENCOUNTER — Other Ambulatory Visit: Payer: Self-pay | Admitting: Cardiology

## 2015-12-23 ENCOUNTER — Other Ambulatory Visit: Payer: Self-pay | Admitting: Cardiology

## 2016-01-08 ENCOUNTER — Other Ambulatory Visit: Payer: Self-pay | Admitting: Internal Medicine

## 2016-01-08 ENCOUNTER — Other Ambulatory Visit: Payer: Self-pay | Admitting: Cardiology

## 2016-02-06 ENCOUNTER — Other Ambulatory Visit: Payer: Self-pay | Admitting: Internal Medicine

## 2016-02-06 ENCOUNTER — Other Ambulatory Visit (HOSPITAL_COMMUNITY): Payer: Self-pay | Admitting: Cardiology

## 2016-02-26 ENCOUNTER — Telehealth (HOSPITAL_COMMUNITY): Payer: Self-pay | Admitting: Pharmacist

## 2016-02-26 MED ORDER — EZETIMIBE 10 MG PO TABS: 10.0000 mg | ORAL_TABLET | Freq: Every day | ORAL | 11 refills | Status: DC

## 2016-02-26 NOTE — Telephone Encounter (Signed)
Patient's insurance ([employer] Part D) prefers generic ezetimibe over Zetia so will send in Rx for ezetimibe to patient's pharmacy.   Ruta Hinds. Velva Harman, PharmD, BCPS, CPP Clinical Pharmacist Pager: [phone] Phone: [phone] 02/26/2016 11:17 AM

## 2016-03-03 ENCOUNTER — Other Ambulatory Visit: Payer: Self-pay | Admitting: Family Medicine

## 2016-03-03 DIAGNOSIS — M79605 Pain in left leg: Secondary | ICD-10-CM

## 2016-03-03 DIAGNOSIS — M79604 Pain in right leg: Secondary | ICD-10-CM

## 2016-03-08 ENCOUNTER — Other Ambulatory Visit: Payer: Self-pay | Admitting: Internal Medicine

## 2016-03-12 ENCOUNTER — Encounter (HOSPITAL_COMMUNITY): Payer: Self-pay

## 2016-03-12 ENCOUNTER — Telehealth (HOSPITAL_COMMUNITY): Payer: Self-pay | Admitting: *Deleted

## 2016-03-12 ENCOUNTER — Ambulatory Visit (HOSPITAL_COMMUNITY)
Admission: RE | Admit: 2016-03-12 | Discharge: 2016-03-12 | Disposition: A | Discharge status: Home / Self Care | Payer: Medicare Other | Source: Ambulatory Visit | Attending: Cardiology | Admitting: Cardiology

## 2016-03-12 VITALS — BP 142/68 | HR 65 | Wt 300.8 lb

## 2016-03-12 DIAGNOSIS — G4733 Obstructive sleep apnea (adult) (pediatric): Secondary | ICD-10-CM | POA: Diagnosis not present

## 2016-03-12 DIAGNOSIS — N189 Chronic kidney disease, unspecified: Secondary | ICD-10-CM | POA: Insufficient documentation

## 2016-03-12 DIAGNOSIS — Z85820 Personal history of malignant melanoma of skin: Secondary | ICD-10-CM | POA: Insufficient documentation

## 2016-03-12 DIAGNOSIS — R001 Bradycardia, unspecified: Secondary | ICD-10-CM | POA: Insufficient documentation

## 2016-03-12 DIAGNOSIS — E1143 Type 2 diabetes mellitus with diabetic autonomic (poly)neuropathy: Secondary | ICD-10-CM | POA: Diagnosis not present

## 2016-03-12 DIAGNOSIS — R002 Palpitations: Secondary | ICD-10-CM | POA: Diagnosis not present

## 2016-03-12 DIAGNOSIS — I447 Left bundle-branch block, unspecified: Secondary | ICD-10-CM | POA: Insufficient documentation

## 2016-03-12 DIAGNOSIS — Z8249 Family history of ischemic heart disease and other diseases of the circulatory system: Secondary | ICD-10-CM | POA: Diagnosis not present

## 2016-03-12 DIAGNOSIS — E1122 Type 2 diabetes mellitus with diabetic chronic kidney disease: Secondary | ICD-10-CM | POA: Insufficient documentation

## 2016-03-12 DIAGNOSIS — Z7982 Long term (current) use of aspirin: Secondary | ICD-10-CM | POA: Diagnosis not present

## 2016-03-12 DIAGNOSIS — Z951 Presence of aortocoronary bypass graft: Secondary | ICD-10-CM | POA: Diagnosis not present

## 2016-03-12 DIAGNOSIS — I350 Nonrheumatic aortic (valve) stenosis: Secondary | ICD-10-CM | POA: Insufficient documentation

## 2016-03-12 DIAGNOSIS — I130 Hypertensive heart and chronic kidney disease with heart failure and stage 1 through stage 4 chronic kidney disease, or unspecified chronic kidney disease: Secondary | ICD-10-CM | POA: Insufficient documentation

## 2016-03-12 DIAGNOSIS — Z794 Long term (current) use of insulin: Secondary | ICD-10-CM | POA: Diagnosis not present

## 2016-03-12 DIAGNOSIS — K219 Gastro-esophageal reflux disease without esophagitis: Secondary | ICD-10-CM | POA: Diagnosis not present

## 2016-03-12 DIAGNOSIS — Z7902 Long term (current) use of antithrombotics/antiplatelets: Secondary | ICD-10-CM | POA: Insufficient documentation

## 2016-03-12 DIAGNOSIS — I25111 Atherosclerotic heart disease of native coronary artery with angina pectoris with documented spasm: Secondary | ICD-10-CM

## 2016-03-12 DIAGNOSIS — I5032 Chronic diastolic (congestive) heart failure: Secondary | ICD-10-CM | POA: Insufficient documentation

## 2016-03-12 DIAGNOSIS — I6523 Occlusion and stenosis of bilateral carotid arteries: Secondary | ICD-10-CM | POA: Insufficient documentation

## 2016-03-12 DIAGNOSIS — E785 Hyperlipidemia, unspecified: Secondary | ICD-10-CM | POA: Diagnosis not present

## 2016-03-12 DIAGNOSIS — I2510 Atherosclerotic heart disease of native coronary artery without angina pectoris: Secondary | ICD-10-CM | POA: Diagnosis present

## 2016-03-12 DIAGNOSIS — Z79899 Other long term (current) drug therapy: Secondary | ICD-10-CM | POA: Insufficient documentation

## 2016-03-12 DIAGNOSIS — Z9889 Other specified postprocedural states: Secondary | ICD-10-CM | POA: Insufficient documentation

## 2016-03-12 DIAGNOSIS — F419 Anxiety disorder, unspecified: Secondary | ICD-10-CM | POA: Diagnosis not present

## 2016-03-12 DIAGNOSIS — Z955 Presence of coronary angioplasty implant and graft: Secondary | ICD-10-CM | POA: Insufficient documentation

## 2016-03-12 DIAGNOSIS — E7800 Pure hypercholesterolemia, unspecified: Secondary | ICD-10-CM

## 2016-03-12 DIAGNOSIS — K449 Diaphragmatic hernia without obstruction or gangrene: Secondary | ICD-10-CM | POA: Diagnosis not present

## 2016-03-12 DIAGNOSIS — Z8673 Personal history of transient ischemic attack (TIA), and cerebral infarction without residual deficits: Secondary | ICD-10-CM | POA: Insufficient documentation

## 2016-03-12 LAB — BASIC METABOLIC PANEL
Anion gap: 9 (ref 5–15)
BUN: 23 mg/dL — ABNORMAL HIGH (ref 6–20)
CO2: 29 mmol/L (ref 22–32)
Calcium: 9.4 mg/dL (ref 8.9–10.3)
Chloride: 100 mmol/L — ABNORMAL LOW (ref 101–111)
Creatinine, Ser: 1.12 mg/dL — ABNORMAL HIGH (ref 0.44–1.00)
GFR calc Af Amer: 57 mL/min — ABNORMAL LOW (ref >60)
GFR calc non Af Amer: 49 mL/min — ABNORMAL LOW (ref >60)
Glucose, Bld: 285 mg/dL — ABNORMAL HIGH (ref 65–99)
Potassium: 5.1 mmol/L (ref 3.5–5.1)
Sodium: 138 mmol/L (ref 135–145)

## 2016-03-12 LAB — LIPID PANEL
Cholesterol: 189 mg/dL (ref 0–200)
HDL: 56 mg/dL (ref >40)
LDL Cholesterol: 97 mg/dL (ref 0–99)
Total CHOL/HDL Ratio: 3.4 RATIO
Triglycerides: 181 mg/dL — ABNORMAL HIGH (ref <150)
VLDL: 36 mg/dL (ref 0–40)

## 2016-03-12 LAB — CBC
HCT: 44.9 % (ref 36.0–46.0)
Hemoglobin: 14.8 g/dL (ref 12.0–15.0)
MCH: 32.2 pg (ref 26.0–34.0)
MCHC: 33 g/dL (ref 30.0–36.0)
MCV: 97.8 fL (ref 78.0–100.0)
Platelets: 222 10*3/uL (ref 150–400)
RBC: 4.59 MIL/uL (ref 3.87–5.11)
RDW: 12.7 % (ref 11.5–15.5)
WBC: 8.3 10*3/uL (ref 4.0–10.5)

## 2016-03-12 LAB — BRAIN NATRIURETIC PEPTIDE: B Natriuretic Peptide: 37.7 pg/mL (ref 0.0–100.0)

## 2016-03-12 MED ORDER — TORSEMIDE 20 MG PO TABS: 80.0000 mg | ORAL_TABLET | Freq: Every day | ORAL | 3 refills | Status: DC

## 2016-03-12 NOTE — Telephone Encounter (Signed)
CBC  Order: [id number]  Status:  Final result Visible to patient:  No (Not Released) Dx:  Chronic diastolic CHF (congestive hea...  Notes Recorded by Leeroy Bock, [place] on 03/12/2016 at 12:59 PM EST Spoke with pt - she had been in the [employer] receiving Praluent for free, but they are no longer providing financial support for PCSK9i. [employer] is supposed to be rolling out a different patient assistance program for Medicare patients next week. Will update pt as I learn more about who will be covered in the new program. In the mean time, she has enough injections to last her another month. If we can get her in the new program, will plan to increase Praluent dose to 150mg /mL to help bring LDL to goal <70. ------  Notes Recorded by Larey Dresser, MD on 03/12/2016 at 12:29 PM EST LDL is still a little higher than I would like. Important to keep her on Praluent.

## 2016-03-12 NOTE — Patient Instructions (Signed)
-  Labs today (will call for abnormal results, otherwise no news is good news)  -STOP Lasix  *START taking Torsemide 80 mg (4 Tablets) Once Daily  -Will refer you to Cardiac Rehab at [facility]. They will call you to set up initial appointment.  -Will refer you to Pharmacy clinic to start Praluent injections, they will call you to set up initial appointment.  -Labs in 10 days  -2 Week Follow up.

## 2016-03-12 NOTE — Progress Notes (Signed)
Patient ID: [name] [name], female   DOB: 1945-03-07, 71 y.o.   [name]: [id number] PCP: Dr. Sandi [name] Cardiology: Dr. Aundra [name]  71 yo with history of CAD s/p CABG, diastolic CHF, and cerebrovascular disease with history of CVA presents for cardiology followup. She had CABG x 5 in 12/11.  Prior to the CABG she had multiple PCIs.  She had a CVA in 2/14 that presented as visual loss.  She had a left carotid stent in 2/15.   Given dyspnea and chest pain that was worsened from her baseline, I took [name] [name] for left heart catheterization in 9/14.  This showed patent SVG-D, LIMA-LAD, and sequential SVG-OM branches but SVG-RCA and the native RCA were both totally occluded.  I suspect that her increased symptoms coincided with occlusion of SVG-RCA.  I started her on Imdur to see if this would help with the chest pain and dyspnea.  However, she feels like Imdur causes leg cramps and does not think that she can take it.  I next started her on ranolazine 500 mg bid and titrated this up to 1000 mg bid.  This helped some but not markedly.  Therefore, I had her see [name] [name] to address opening RCA CTO. He was able to do this in 5/15 with 4 overlapping Xience DES.  This led to resolution of exertional chest pain.   [name] [name] was started on Brilinta after PCI.  She became much more short of breath after starting on Brilinta.  I had her stop Brilinta and replaced it with Plavix.  Dyspnea improved off Brilinta. Given increasing exertional dyspnea and chest pain, I did RHC/LHC in 4/17.  This showed stable CAD with no interventional target.  Left and right heart filling pressures were not significantly increased.  Medical management.    Weight is up about 12 lbs since last appointment.  Dyspnea is worse, she is short of breath walking short distances.  She has orthopnea, sleeps at 45 degree angle.  She has a stable pattern of atypical chest pain (lower chest/epigastric pain usually when lying in bed or sitting in chair, not with  exertion).  Poor energy, fatigue. Trying to watch her sodium intake.   Labs (6/14): K 3.8, creatinine 0.71 Labs (8/14): BNP 249, LDL 166 Labs (9/14): K 4.7, creatinine 0.7 Labs (9/14): K 4.6, creatinine 0.9, BNP 109 Labs (1/15): K 4.5, creatinine 0.73, LDL 212, HCT 43.4, TSH normal, BNP 138 Labs (6/15): K 4.7, creatinine 1.17, LDL 100, HDL 37, TGs 363, BNP 39 Labs (10/15): K 4.6, creatinine 1.2 Labs (1/16): LDL 157, HDL 43, K 5.2, creatinine 0.95 Labs (2/16): K 4.5, creatinine 1.08, BNP 113 Labs (4/16): LDL 50, HDL 56, TGs 179 Labs (9/16): K 5.3, creatinine 1.09 Labs (10/16): LDL 75, HDL 56 Labs (2/17): K 5.2, creatinine 1.07 Labs (4/17): K 5.1, creatinine 1.07 Labs (5/17): K 4.5, creatinine 1.01, LDL 96, HDL 56, BNP 70  ECG: NSR, LBBB  PMH: 1. Diabetic gastroparesis 2. Type II diabetes 3. HTN 4. Morbid obesity 5. CAD: s/p CABG in 12/11 after prior PCIs.  LIMA-LAD, SVG-D, seq SVG-OM1 and OM2, SVG-PDA. Adenosine Cardiolite (8/14) with EF 53% and a small reversible apical defect with a medium, partially reversible inferior defect.  LHC (9/14) with patent SVG-D, LIMA-LAD (50% distal LAD), and sequential SVG-OM branches; the SVG-RCA and the native RCA were occluded.  This was managed medically initially, but with ongoing exertional chest pain, it was decided to open CTO.  Patient had CTO opening with 4 overlapping  Xience DES in the RCA in 5/15.   - LHC (4/17): SVG-D patent, LIMA-LAD patent, distal LAD with several 40-50% stenoses, sequential SVG-OM1 and PLOM patent with 50% proximal stenosis (not flow limiting), SVG-RCA TO with patent RCA stents.  6. Atypical migraines 7. OSA: Intolerant of CPAP.  8. GERD with hiatal hernia 9. OA 10. Diastolic CHF: Echo (95/62) with EF 50-55%, grade II diastolic dysfunction, mild-moderate MR.  Echo (8/14) with EF 55-60%, grade II diastolic dysfunction, mildly increased aortic valve gradient (mean 12 mmHg) but valve opens well, mild MR and mild RV dilation.   Echo (9/15) with EF 60-65%, grade II diastolic dysfunction, mild aortic stenosis, mild mitral stenosis, mild to moderate mitral regurgitation, mildly dilated RV with normal systolic function, PA systolic pressure 46 mmHg.  - RHC (4/17): mean RA 9, PA 38/15 mean 27, mean PCWP 9, CI 2.5.  - Echo (5/17): EF 55-60%, mild LVH, mildly dilated RV with low normal systolic function, moderate TR, PASP 61 mmHg 11. CKD 12. Chronic LBBB 13. Anxiety 14. Carotid stenosis: Followed by [facility], >13% LICA stenosis 08/65.  She had left carotid stent in 2/15. Carotid dopplers 8/15 with no significant disease. Carotid dopplers (4/16): < 40% RICA stenosis.  15. Cerebrovascular disease: CVA 2/14 with right posterior cerebral artery territory ischemic infarction. Cerebral angiogram in 6/14 showed 70% right vertebral ostial stenosis, 78-46% LICA stenosis, > 96% proximal left posterior cerebral artery stenosis, posterior communicating artery aneurysm.  Patient has had episodes of transient expressive aphasia.  Carotid dopplers (29/52) showed >84% LICA stenosis. She had a left carotid stent 2/15.  Possible CVA in 7/15.  16. Positional vertigo (suspected) 17. Palpitations: Holter (6/15) with rare PVCs/PACs.  18. Dyspnea with Brilinta. 19. Melanoma: On face, s/p excision.   20. Aortic stenosis: Mild.  21. Holter (8/16) with no significant arrhythmias.  22. Lower extremity arterial dopplers (9/16) were normal.   SH: Married, nonsmoker  FH: CAD  ROS: All systems reviewed and negative except as per HPI.   Current Outpatient Prescriptions  Medication Sig Dispense Refill  . acetaminophen (TYLENOL) 325 MG tablet Take 650 mg by mouth every 6 (six) hours as needed for mild pain.     Marland Kitchen albuterol (PROVENTIL) (2.5 MG/3ML) 0.083% nebulizer solution Take 2.5 mg by nebulization every 6 (six) hours as needed for shortness of breath (when asthma is active).     Marland Kitchen albuterol (PROVENTIL,VENTOLIN) 90 MCG/ACT inhaler Inhale 2 puffs into the  lungs every 6 (six) hours as needed for shortness of breath.     . ALPRAZolam (XANAX) 0.5 MG tablet Take 0.5-1 mg by mouth 3 (three) times daily. 0.5mg  in the morning and at lunch, 1mg  at bedtime    . amLODipine (NORVASC) 10 MG tablet Take 10 mg by mouth daily.    Marland Kitchen aspirin EC 81 MG tablet Take 162 mg by mouth daily.    Marland Kitchen b complex vitamins capsule Take 1 capsule by mouth daily.    . Calcium Carbonate 1500 (600 CA) MG TABS Take 1,500 mg by mouth daily with breakfast.    . Cholecalciferol (VITAMIN D) 2000 UNITS tablet Take 2,000 Units by mouth daily with breakfast.     . cholestyramine (QUESTRAN) 4 GM/DOSE powder Take 2 g by mouth 2 (two) times daily with a meal. (mixed in juice or light kool-aid).    . cloNIDine (CATAPRES) 0.2 MG tablet Take 0.2 mg by mouth 2 (two) times daily.    . clopidogrel (PLAVIX) 75 MG tablet TAKE ONE (1) TABLET  BY MOUTH EVERY DAY 90 tablet 3  . denosumab (PROLIA) 60 MG/ML SOLN injection Inject 60 mg into the skin every 6 (six) months. Administer in upper arm, thigh, or abdomen    . DULoxetine (CYMBALTA) 60 MG capsule Take 60 mg by mouth daily with breakfast.    . ezetimibe (ZETIA) 10 MG tablet Take 1 tablet (10 mg total) by mouth daily. 30 tablet 11  . Fluticasone-Salmeterol (ADVAIR DISKUS) 250-50 MCG/DOSE AEPB Inhale 1 puff into the lungs every 12 (twelve) hours.     . gabapentin (NEURONTIN) 600 MG tablet Take 600-750 mg by mouth 3 (three) times daily. 600mg  in the morning and at noon. 750mg  at bedtime.    Marland Kitchen HUMALOG KWIKPEN 100 UNIT/ML SOPN Inject 10-14 Units into the skin 4 (four) times daily -  with meals and at bedtime. 10 units at every meal and 14 units at bedtime if needed    . hydrALAZINE (APRESOLINE) 50 MG tablet Take 50 mg by mouth 3 (three) times daily as needed (for high blood pressure). If BP is >175    . HYDROcodone-acetaminophen (NORCO/VICODIN) 5-325 MG per tablet Take 1 tablet by mouth at bedtime as needed for moderate pain.     Marland Kitchen ipratropium-albuterol  (DUONEB) 0.5-2.5 (3) MG/3ML SOLN Take 3 mLs by nebulization as needed (If severe asthma , use every 4 hours for 10 days).     Marland Kitchen LANTUS SOLOSTAR 100 UNIT/ML Solostar Pen Inject 44 Units into the skin every morning.     . loperamide (IMODIUM) 2 MG capsule Take 2 mg by mouth as needed for diarrhea or loose stools.    Marland Kitchen losartan (COZAAR) 100 MG tablet Take 100 mg by mouth daily with breakfast.     . magnesium oxide (MAG-OX) 400 MG tablet Take 400 mg by mouth 2 (two) times daily.    . meclizine (ANTIVERT) 12.5 MG tablet Take 12.5 mg by mouth 3 (three) times daily as needed for dizziness.    . metoprolol succinate (TOPROL-XL) 25 MG 24 hr tablet TAKE ONE TABLET TWICE DAILY 180 tablet 3  . Multiple Vitamin (MULTIVITAMIN) tablet Take 1 tablet by mouth at bedtime.     . nitroGLYCERIN (NITROSTAT) 0.3 MG SL tablet Place 1 tablet (0.3 mg total) under the tongue every 5 (five) minutes x 3 doses as needed for chest pain. 90 tablet 1  . ondansetron (ZOFRAN) 4 MG tablet Take 1 tablet (4 mg total) by mouth 2 (two) times daily as needed. For nausea (Patient taking differently: Take 4 mg by mouth 2 (two) times daily. For nausea) 20 tablet 0  . pantoprazole (PROTONIX) 40 MG tablet Take 1 tablet (40 mg total) by mouth 2 (two) times daily. 180 tablet 3  . PRALUENT 75 MG/ML SOPN INJECT 75MG  INTO SKIN EVERY 14 DAYS 2 pen 11  . Probiotic Product (PROBIOTIC DAILY PO) Take 1 tablet by mouth 2 (two) times daily.    Marland Kitchen RANEXA 1000 MG SR tablet TAKE ONE (1) TABLET BY MOUTH TWO (2) TIMES DAILY 60 each 3  . rosuvastatin (CRESTOR) 40 MG tablet TAKE ONE (1) TABLET EACH DAY 90 tablet 6  . zinc gluconate 50 MG tablet Take 50 mg by mouth at bedtime.     Marland Kitchen zolpidem (AMBIEN) 10 MG tablet Take 10 mg by mouth at bedtime as needed for sleep.     Marland Kitchen torsemide (DEMADEX) 20 MG tablet Take 4 tablets (80 mg total) by mouth daily. 120 tablet 3   No current facility-administered medications for this  encounter.     BP (!) 142/68 (BP Location:  Left Wrist, Patient Position: Sitting, Cuff Size: Normal)   Pulse 65   Wt (!) 300 lb 12.8 oz (136.4 kg)   SpO2 94%   BMI 55.46 kg/m  General: NAD, obese Neck: JVP 8-9 cm, no thyromegaly or thyroid nodule.  Lungs: Occasional squeaks noted on lung exam.  CV: Nondisplaced PMI.  Heart regular S1/S2 with wide S2 split, no S3/S4, 1/6 early SEM.  1+ edema 3/4 to knees bilaterally.  Slight right carotid bruit.  Normal pedal pulses.  Abdomen: Soft, nontender, no hepatosplenomegaly, no distention.  Skin: Intact without lesions or rashes.  Neurologic: Alert and oriented x 3.  Psych: Normal affect. Extremities: No clubbing or cyanosis.   Assessment/Plan: 1. CAD: Occluded native RCA and SVG-RCA.  Now status post opening of CTO RCA with 4 overlapping Xience DES.  Most recent cath in 4/17 showed patent RCA stents, no interventional target.  - Continue ASA, statin, losartan, Crestor, amlodipine, metoprolol, and ranolazine.  - She will continue Plavix long-term.   - She has not tolerated Imdur in the past. - Will try again to get her in cardiac rehab.  2. Chronic diastolic CHF:  Suspect obesity/deconditioning plays a large role in her dyspnea, but she does look volume overloaded today. - Stop Lasix, start torsemide 80 mg daily with BMET/BNP today and repeat BMET in 10 days.       3. Hyperlipidemia: She remains on Zetia, Crestor and Praluent.   Check lipids today.  4. Hypertension: BP is controlled. Denies orthostatic symptoms.    5. Cerebrovascular disease: She has history of CVA and had left carotid stent in 2/15. Followed by [facility] (has appt in 4/18).  6. Palpitations: Improved on higher Toprol XL.    Followup in 2 wks.    Loralie Champagne 03/12/2016

## 2016-03-15 ENCOUNTER — Ambulatory Visit
Admission: RE | Admit: 2016-03-15 | Discharge: 2016-03-15 | Disposition: A | Discharge status: Home / Self Care | Payer: Medicare Other | Source: Ambulatory Visit | Attending: Family Medicine | Admitting: Family Medicine

## 2016-03-15 DIAGNOSIS — M79605 Pain in left leg: Secondary | ICD-10-CM

## 2016-03-15 DIAGNOSIS — M79604 Pain in right leg: Secondary | ICD-10-CM

## 2016-03-22 ENCOUNTER — Ambulatory Visit (HOSPITAL_COMMUNITY)
Admission: RE | Admit: 2016-03-22 | Discharge: 2016-03-22 | Disposition: A | Discharge status: Home / Self Care | Payer: Medicare Other | Source: Ambulatory Visit | Attending: Cardiology | Admitting: Cardiology

## 2016-03-22 DIAGNOSIS — I5032 Chronic diastolic (congestive) heart failure: Secondary | ICD-10-CM | POA: Diagnosis present

## 2016-03-22 LAB — BASIC METABOLIC PANEL
Anion gap: 10 (ref 5–15)
BUN: 24 mg/dL — ABNORMAL HIGH (ref 6–20)
CO2: 31 mmol/L (ref 22–32)
Calcium: 9.7 mg/dL (ref 8.9–10.3)
Chloride: 99 mmol/L — ABNORMAL LOW (ref 101–111)
Creatinine, Ser: 1.26 mg/dL — ABNORMAL HIGH (ref 0.44–1.00)
GFR calc Af Amer: 50 mL/min — ABNORMAL LOW (ref >60)
GFR calc non Af Amer: 43 mL/min — ABNORMAL LOW (ref >60)
Glucose, Bld: 211 mg/dL — ABNORMAL HIGH (ref 65–99)
Potassium: 5.1 mmol/L (ref 3.5–5.1)
Sodium: 140 mmol/L (ref 135–145)

## 2016-03-23 NOTE — Addendum Note (Signed)
Addended by: Thy Gullikson E on: 03/23/2016 12:21 PM   Modules accepted: Orders

## 2016-03-23 NOTE — Telephone Encounter (Signed)
New Praluent patient assistance form has been rolled out. Submitted patient assistance form for higher strength of Praluent 150mg /mL dose since most recent LDL was above goal 70mg /dL. Will f/u with pt once her Praluent copay is covered by pt assistance.

## 2016-03-30 ENCOUNTER — Ambulatory Visit (HOSPITAL_COMMUNITY)
Admission: RE | Admit: 2016-03-30 | Discharge: 2016-03-30 | Disposition: A | Discharge status: Home / Self Care | Payer: Medicare Other | Source: Ambulatory Visit | Attending: Cardiology | Admitting: Cardiology

## 2016-03-30 VITALS — BP 122/88 | HR 74 | Wt 298.0 lb

## 2016-03-30 DIAGNOSIS — Z8249 Family history of ischemic heart disease and other diseases of the circulatory system: Secondary | ICD-10-CM | POA: Insufficient documentation

## 2016-03-30 DIAGNOSIS — N189 Chronic kidney disease, unspecified: Secondary | ICD-10-CM | POA: Diagnosis not present

## 2016-03-30 DIAGNOSIS — R002 Palpitations: Secondary | ICD-10-CM | POA: Insufficient documentation

## 2016-03-30 DIAGNOSIS — Z794 Long term (current) use of insulin: Secondary | ICD-10-CM | POA: Diagnosis not present

## 2016-03-30 DIAGNOSIS — R29818 Other symptoms and signs involving the nervous system: Secondary | ICD-10-CM

## 2016-03-30 DIAGNOSIS — E1122 Type 2 diabetes mellitus with diabetic chronic kidney disease: Secondary | ICD-10-CM | POA: Diagnosis not present

## 2016-03-30 DIAGNOSIS — R5383 Other fatigue: Secondary | ICD-10-CM | POA: Diagnosis not present

## 2016-03-30 DIAGNOSIS — I2510 Atherosclerotic heart disease of native coronary artery without angina pectoris: Secondary | ICD-10-CM | POA: Diagnosis present

## 2016-03-30 DIAGNOSIS — I130 Hypertensive heart and chronic kidney disease with heart failure and stage 1 through stage 4 chronic kidney disease, or unspecified chronic kidney disease: Secondary | ICD-10-CM | POA: Insufficient documentation

## 2016-03-30 DIAGNOSIS — K449 Diaphragmatic hernia without obstruction or gangrene: Secondary | ICD-10-CM | POA: Insufficient documentation

## 2016-03-30 DIAGNOSIS — E785 Hyperlipidemia, unspecified: Secondary | ICD-10-CM | POA: Diagnosis not present

## 2016-03-30 DIAGNOSIS — I350 Nonrheumatic aortic (valve) stenosis: Secondary | ICD-10-CM | POA: Diagnosis not present

## 2016-03-30 DIAGNOSIS — G4730 Sleep apnea, unspecified: Secondary | ICD-10-CM | POA: Diagnosis not present

## 2016-03-30 DIAGNOSIS — R0602 Shortness of breath: Secondary | ICD-10-CM | POA: Diagnosis not present

## 2016-03-30 DIAGNOSIS — F419 Anxiety disorder, unspecified: Secondary | ICD-10-CM | POA: Insufficient documentation

## 2016-03-30 DIAGNOSIS — G4733 Obstructive sleep apnea (adult) (pediatric): Secondary | ICD-10-CM | POA: Insufficient documentation

## 2016-03-30 DIAGNOSIS — Z9889 Other specified postprocedural states: Secondary | ICD-10-CM | POA: Insufficient documentation

## 2016-03-30 DIAGNOSIS — E1143 Type 2 diabetes mellitus with diabetic autonomic (poly)neuropathy: Secondary | ICD-10-CM | POA: Insufficient documentation

## 2016-03-30 DIAGNOSIS — E7849 Other hyperlipidemia: Secondary | ICD-10-CM

## 2016-03-30 DIAGNOSIS — K219 Gastro-esophageal reflux disease without esophagitis: Secondary | ICD-10-CM | POA: Insufficient documentation

## 2016-03-30 DIAGNOSIS — Z79899 Other long term (current) drug therapy: Secondary | ICD-10-CM | POA: Insufficient documentation

## 2016-03-30 DIAGNOSIS — I25111 Atherosclerotic heart disease of native coronary artery with angina pectoris with documented spasm: Secondary | ICD-10-CM

## 2016-03-30 DIAGNOSIS — Z7902 Long term (current) use of antithrombotics/antiplatelets: Secondary | ICD-10-CM | POA: Insufficient documentation

## 2016-03-30 DIAGNOSIS — Z8673 Personal history of transient ischemic attack (TIA), and cerebral infarction without residual deficits: Secondary | ICD-10-CM | POA: Insufficient documentation

## 2016-03-30 DIAGNOSIS — I5032 Chronic diastolic (congestive) heart failure: Secondary | ICD-10-CM | POA: Diagnosis not present

## 2016-03-30 DIAGNOSIS — Z955 Presence of coronary angioplasty implant and graft: Secondary | ICD-10-CM | POA: Insufficient documentation

## 2016-03-30 DIAGNOSIS — Z951 Presence of aortocoronary bypass graft: Secondary | ICD-10-CM | POA: Insufficient documentation

## 2016-03-30 DIAGNOSIS — E784 Other hyperlipidemia: Secondary | ICD-10-CM | POA: Diagnosis not present

## 2016-03-30 DIAGNOSIS — Z85820 Personal history of malignant melanoma of skin: Secondary | ICD-10-CM | POA: Diagnosis not present

## 2016-03-30 DIAGNOSIS — Z7982 Long term (current) use of aspirin: Secondary | ICD-10-CM | POA: Insufficient documentation

## 2016-03-30 LAB — BASIC METABOLIC PANEL
Anion gap: 7 (ref 5–15)
BUN: 22 mg/dL — ABNORMAL HIGH (ref 6–20)
CO2: 30 mmol/L (ref 22–32)
Calcium: 9.4 mg/dL (ref 8.9–10.3)
Chloride: 103 mmol/L (ref 101–111)
Creatinine, Ser: 1.07 mg/dL — ABNORMAL HIGH (ref 0.44–1.00)
GFR calc Af Amer: >60 mL/min (ref >60)
GFR calc non Af Amer: 52 mL/min — ABNORMAL LOW (ref >60)
Glucose, Bld: 180 mg/dL — ABNORMAL HIGH (ref 65–99)
Potassium: 4.9 mmol/L (ref 3.5–5.1)
Sodium: 140 mmol/L (ref 135–145)

## 2016-03-30 NOTE — Progress Notes (Signed)
Patient ID: [name] [name], female   DOB: 05/29/1945, 71 y.o.   [name]: [id number] PCP: Dr. Sandi Mariscal Cardiology: Dr. [name]  71 yo with history of CAD s/p CABG, diastolic CHF, and cerebrovascular disease with history of CVA presents for cardiology followup. She had CABG x 5 in 12/11.  Prior to the CABG she had multiple PCIs.  She had a CVA in 2/14 that presented as visual loss.  She had a left carotid stent in 2/15.   Given dyspnea and chest pain that was worsened from her baseline, I took [name] [name] for left heart catheterization in 9/14.  This showed patent SVG-D, LIMA-LAD, and sequential SVG-OM branches but SVG-RCA and the native RCA were both totally occluded.  I suspect that her increased symptoms coincided with occlusion of SVG-RCA.  I started her on Imdur to see if this would help with the chest pain and dyspnea.  However, she feels like Imdur causes leg cramps and does not think that she can take it.  So ranolazine 500 mg bid started and titrated this up to 1000 mg bid.  This helped some but not markedly.  Therefore, sent to  Dr. Irish Lack to address opening RCA CTO. He was able to do this in 5/15 with 4 overlapping Xience DES.  This led to resolution of exertional chest pain.    [name] [name] was started on Brilinta after PCI.  She became much more short of breath after starting on Brilinta. Per Dr Delano Metz stopped and replaced with Plavix.  Dyspnea improved off Brilinta. Given increasing exertional dyspnea and chest pain,  RHC/LHC in 4/17.  This showed stable CAD with no interventional target.  Left and right heart filling pressures were not significantly increased.  Medical management.   Today she returns for HF follow up. Last visit lasix was stopped and torsemide was started. Complaining of nausea. She has been followed by GI. Nausea thought to be from gastric bypass. Complains of fatigue. SOB wit exertion. + Orthopnea. Denies PND. She has not been weighing at home. Not exercising but has a gym  memership. Taking all medications.   Labs (6/14): K 3.8, creatinine 0.71 Labs (8/14): BNP 249, LDL 166 Labs (9/14): K 4.7, creatinine 0.7 Labs (9/14): K 4.6, creatinine 0.9, BNP 109 Labs (1/15): K 4.5, creatinine 0.73, LDL 212, HCT 43.4, TSH normal, BNP 138 Labs (6/15): K 4.7, creatinine 1.17, LDL 100, HDL 37, TGs 363, BNP 39 Labs (10/15): K 4.6, creatinine 1.2 Labs (1/16): LDL 157, HDL 43, K 5.2, creatinine 0.95 Labs (2/16): K 4.5, creatinine 1.08, BNP 113 Labs (4/16): LDL 50, HDL 56, TGs 179 Labs (9/16): K 5.3, creatinine 1.09 Labs (10/16): LDL 75, HDL 56 Labs (2/17): K 5.2, creatinine 1.07 Labs (4/17): K 5.1, creatinine 1.07 Labs (5/17): K 4.5, creatinine 1.01, LDL 96, HDL 56, BNP 70  ECG: NSR, LBBB  PMH: 1. Diabetic gastroparesis 2. Type II diabetes 3. HTN 4. Morbid obesity 5. CAD: s/p CABG in 12/11 after prior PCIs.  LIMA-LAD, SVG-D, seq SVG-OM1 and OM2, SVG-PDA. Adenosine Cardiolite (8/14) with EF 53% and a small reversible apical defect with a medium, partially reversible inferior defect.  LHC (9/14) with patent SVG-D, LIMA-LAD (50% distal LAD), and sequential SVG-OM branches; the SVG-RCA and the native RCA were occluded.  This was managed medically initially, but with ongoing exertional chest pain, it was decided to open CTO.  Patient had CTO opening with 4 overlapping Xience DES in the RCA in 5/15.   - LHC (4/17): SVG-D  patent, LIMA-LAD patent, distal LAD with several 40-50% stenoses, sequential SVG-OM1 and PLOM patent with 50% proximal stenosis (not flow limiting), SVG-RCA TO with patent RCA stents.  6. Atypical migraines 7. OSA: Intolerant of CPAP.  8. GERD with hiatal hernia 9. OA 10. Diastolic CHF: Echo (82/99) with EF 50-55%, grade II diastolic dysfunction, mild-moderate MR.  Echo (8/14) with EF 55-60%, grade II diastolic dysfunction, mildly increased aortic valve gradient (mean 12 mmHg) but valve opens well, mild MR and mild RV dilation.  Echo (9/15) with EF 60-65%,  grade II diastolic dysfunction, mild aortic stenosis, mild mitral stenosis, mild to moderate mitral regurgitation, mildly dilated RV with normal systolic function, PA systolic pressure 46 mmHg.  - RHC (4/17): mean RA 9, PA 38/15 mean 27, mean PCWP 9, CI 2.5.  - Echo (5/17): EF 55-60%, mild LVH, mildly dilated RV with low normal systolic function, moderate TR, PASP 61 mmHg 11. CKD 12. Chronic LBBB 13. Anxiety 14. Carotid stenosis: Followed by [facility], >37% LICA stenosis 16/96.  She had left carotid stent in 2/15. Carotid dopplers 8/15 with no significant disease. Carotid dopplers (4/16): < 40% RICA stenosis.  15. Cerebrovascular disease: CVA 2/14 with right posterior cerebral artery territory ischemic infarction. Cerebral angiogram in 6/14 showed 70% right vertebral ostial stenosis, 78-93% LICA stenosis, > 81% proximal left posterior cerebral artery stenosis, posterior communicating artery aneurysm.  Patient has had episodes of transient expressive aphasia.  Carotid dopplers (01/75) showed >10% LICA stenosis. She had a left carotid stent 2/15.  Possible CVA in 7/15.  16. Positional vertigo (suspected) 17. Palpitations: Holter (6/15) with rare PVCs/PACs.  18. Dyspnea with Brilinta. 19. Melanoma: On face, s/p excision.   20. Aortic stenosis: Mild.  21. Holter (8/16) with no significant arrhythmias.  22. Lower extremity arterial dopplers (9/16) were normal.   SH: Married, nonsmoker  FH: CAD  ROS: All systems reviewed and negative except as per HPI.   Current Outpatient Prescriptions  Medication Sig Dispense Refill  . acetaminophen (TYLENOL) 325 MG tablet Take 650 mg by mouth every 6 (six) hours as needed for mild pain.     Marland Kitchen albuterol (PROVENTIL) (2.5 MG/3ML) 0.083% nebulizer solution Take 2.5 mg by nebulization every 6 (six) hours as needed for shortness of breath (when asthma is active).     Marland Kitchen albuterol (PROVENTIL,VENTOLIN) 90 MCG/ACT inhaler Inhale 2 puffs into the lungs every 6 (six) hours as  needed for shortness of breath.     . Alirocumab (PRALUENT) 150 MG/ML SOPN Inject 1 pen into the skin every 14 (fourteen) days.    . ALPRAZolam (XANAX) 0.5 MG tablet Take 0.5-1 mg by mouth 3 (three) times daily. 0.5mg  in the morning and at lunch, 1mg  at bedtime    . amLODipine (NORVASC) 10 MG tablet Take 10 mg by mouth daily.    Marland Kitchen aspirin EC 81 MG tablet Take 162 mg by mouth daily.    Marland Kitchen b complex vitamins capsule Take 1 capsule by mouth daily.    . Calcium Carbonate 1500 (600 CA) MG TABS Take 1,500 mg by mouth daily with breakfast.    . Cholecalciferol (VITAMIN D) 2000 UNITS tablet Take 2,000 Units by mouth daily with breakfast.     . cholestyramine (QUESTRAN) 4 GM/DOSE powder Take 2 g by mouth 2 (two) times daily with a meal. (mixed in juice or light kool-aid).    . cloNIDine (CATAPRES) 0.2 MG tablet Take 0.2 mg by mouth 2 (two) times daily.    . clopidogrel (PLAVIX) 75  MG tablet TAKE ONE (1) TABLET BY MOUTH EVERY DAY 90 tablet 3  . denosumab (PROLIA) 60 MG/ML SOLN injection Inject 60 mg into the skin every 6 (six) months. Administer in upper arm, thigh, or abdomen    . DULoxetine (CYMBALTA) 60 MG capsule Take 60 mg by mouth daily with breakfast.    . ezetimibe (ZETIA) 10 MG tablet Take 1 tablet (10 mg total) by mouth daily. 30 tablet 11  . Fluticasone-Salmeterol (ADVAIR DISKUS) 250-50 MCG/DOSE AEPB Inhale 1 puff into the lungs every 12 (twelve) hours.     . gabapentin (NEURONTIN) 600 MG tablet Take 600-750 mg by mouth 3 (three) times daily. 600mg  in the morning and at noon. 750mg  at bedtime.    Marland Kitchen HUMALOG KWIKPEN 100 UNIT/ML SOPN Inject 10-14 Units into the skin 4 (four) times daily -  with meals and at bedtime. 10 units at every meal and 14 units at bedtime if needed    . hydrALAZINE (APRESOLINE) 50 MG tablet Take 50 mg by mouth 3 (three) times daily as needed (for high blood pressure). If BP is >175    . HYDROcodone-acetaminophen (NORCO/VICODIN) 5-325 MG per tablet Take 1 tablet by mouth at  bedtime as needed for moderate pain.     Marland Kitchen ipratropium-albuterol (DUONEB) 0.5-2.5 (3) MG/3ML SOLN Take 3 mLs by nebulization as needed (If severe asthma , use every 4 hours for 10 days).     Marland Kitchen LANTUS SOLOSTAR 100 UNIT/ML Solostar Pen Inject 44 Units into the skin every morning.     . loperamide (IMODIUM) 2 MG capsule Take 2 mg by mouth as needed for diarrhea or loose stools.    Marland Kitchen losartan (COZAAR) 100 MG tablet Take 100 mg by mouth daily with breakfast.     . magnesium oxide (MAG-OX) 400 MG tablet Take 400 mg by mouth 2 (two) times daily.    . meclizine (ANTIVERT) 12.5 MG tablet Take 12.5 mg by mouth 3 (three) times daily as needed for dizziness.    . metoprolol succinate (TOPROL-XL) 25 MG 24 hr tablet TAKE ONE TABLET TWICE DAILY 180 tablet 3  . Multiple Vitamin (MULTIVITAMIN) tablet Take 1 tablet by mouth at bedtime.     . nitroGLYCERIN (NITROSTAT) 0.3 MG SL tablet Place 1 tablet (0.3 mg total) under the tongue every 5 (five) minutes x 3 doses as needed for chest pain. 90 tablet 1  . ondansetron (ZOFRAN) 4 MG tablet Take 1 tablet (4 mg total) by mouth 2 (two) times daily as needed. For nausea (Patient taking differently: Take 4 mg by mouth 2 (two) times daily. For nausea) 20 tablet 0  . pantoprazole (PROTONIX) 40 MG tablet Take 1 tablet (40 mg total) by mouth 2 (two) times daily. 180 tablet 3  . Probiotic Product (PROBIOTIC DAILY PO) Take 1 tablet by mouth 2 (two) times daily.    Marland Kitchen RANEXA 1000 MG SR tablet TAKE ONE (1) TABLET BY MOUTH TWO (2) TIMES DAILY 60 each 3  . rosuvastatin (CRESTOR) 40 MG tablet TAKE ONE (1) TABLET EACH DAY 90 tablet 6  . torsemide (DEMADEX) 20 MG tablet Take 4 tablets (80 mg total) by mouth daily. 120 tablet 3  . zinc gluconate 50 MG tablet Take 50 mg by mouth at bedtime.     Marland Kitchen zolpidem (AMBIEN) 10 MG tablet Take 10 mg by mouth at bedtime as needed for sleep.      No current facility-administered medications for this encounter.     BP 122/88 (BP Location: Right  Arm,  Patient Position: Sitting, Cuff Size: Large)   Pulse 74   Wt 298 lb (135.2 kg)   SpO2 92%   BMI 54.95 kg/m  General: NAD. Obese, Ambulated in the clinic without difficulty Neck: JVP 5-6 cm, no thyromegaly or thyroid nodule.  Lungs: CTAB. On room air.   CV: Nondisplaced PMI.  Heart regular. S1/S2 with wide S2 split, no S3/S4, 1/6 early SEM.  R and LLE trace edema. Normal pedal pulses.  Abdomen: Obese, soft, NT, ND, Mo hepatosplenomegaly, no distention.  Skin: Intact without lesions or rashes. Warm.  Neurologic: Alert and oriented x 3.  Psych: Normal affect. Extremities: No clubbing or cyanosis. Trace lower extremity edema.   Assessment/Plan: 1. CAD: Occluded native RCA and SVG-RCA.  Now status post opening of CTO RCA with 4 overlapping Xience DES.  Most recent cath in 4/17 showed patent RCA stents, no interventional target.  - Continue ASA, statin, losartan, Crestor, amlodipine, metoprolol, and ranolazine. No chest pain.  - She will continue Plavix long-term.   - She has not tolerated Imdur in the past. -Unable to start cardiac rehab.  2. Chronic diastolic CHF:   Remains SOB with exertion. Likely due to deconditioning and obesity.  Volume status stable. Continue torsemide 80 mg daily. Check BMET today.  Do the following things EVERYDAY: 1) Weigh yourself in the morning before breakfast. Write it down and keep it in a log. 2) Take your medicines as prescribed 3) Eat low salt foods-Limit salt (sodium) to 2000 mg per day.  4) Stay as active as you can everyday 5) Limit all fluids for the day to less than 2 liters 3. Hyperlipidemia: She remains on Zetia, Crestor and Praluent.    4. Hypertension: Controlled. Continue current regimen.     5. Cerebrovascular disease: She has history of CVA and had left carotid stent in 2/15. Followed by [facility] (has appt in 4/18).  6. Palpitations: Improved on higher Toprol XL.  7. Suspected sleep apnea.. Day Time Fatigue- Set up for sleep study with Dr Radford Pax    Follow up in 8 weeks,.    Javarian Jakubiak NP-C  03/30/2016

## 2016-03-30 NOTE — Progress Notes (Signed)
Advanced Heart Failure Medication Review by a Pharmacist  Does the patient  feel that his/her medications are working for him/her?  yes  Has the patient been experiencing any side effects to the medications prescribed?  no  Does the patient measure his/her own blood pressure or blood glucose at home?  yes   Does the patient have any problems obtaining medications due to transportation or finances?   Yes - working on [employer] patient assistance  Understanding of regimen: good Understanding of indications: good Potential of compliance: good Patient understands to avoid NSAIDs. Patient understands to avoid decongestants.  Issues to address at subsequent visits: None   Pharmacist comments:  [name] [name] is a pleasant 71 yo F presenting with a current medication list. She reports great compliance with her regimen. She does state that since switching from furosemide to torsemide she has been a little more nauseous than usual. I advised her to take it with some food to see if this helps. She also states that she has run out of Praluent so I will follow up with Fuller [place] (PharmD at [facility]) to see where we are in the patient assistance process.   Ruta Hinds. Velva Harman, PharmD, BCPS, CPP Clinical Pharmacist Pager: [phone] Phone: [phone] 03/30/2016 9:57 AM      Time with patient: 18 minutes Preparation and documentation time: 4 minutes Total time: 22 minutes

## 2016-03-30 NOTE — Patient Instructions (Signed)
Routine lab work today. Will notify you of abnormal results, otherwise no news is good news!  Will refer you for a sleep study at [facility] Address: [place], [place], [place] [address]  Phone: [phone]  Do the following things EVERYDAY: 1) Weigh yourself in the morning before breakfast. Write it down and keep it in a log. 2) Take your medicines as prescribed 3) Eat low salt foods-Limit salt (sodium) to 2000 mg per day.  4) Stay as active as you can everyday 5) Limit all fluids for the day to less than 2 liters

## 2016-04-05 DIAGNOSIS — R1314 Dysphagia, pharyngoesophageal phase: Secondary | ICD-10-CM | POA: Insufficient documentation

## 2016-04-05 DIAGNOSIS — K219 Gastro-esophageal reflux disease without esophagitis: Secondary | ICD-10-CM | POA: Insufficient documentation

## 2016-04-05 DIAGNOSIS — R490 Dysphonia: Secondary | ICD-10-CM | POA: Insufficient documentation

## 2016-04-07 ENCOUNTER — Telehealth: Payer: Self-pay | Admitting: Pharmacist

## 2016-04-07 ENCOUNTER — Encounter: Payer: Self-pay | Admitting: Neurology

## 2016-04-07 ENCOUNTER — Other Ambulatory Visit: Payer: Self-pay | Admitting: Pharmacist

## 2016-04-07 ENCOUNTER — Ambulatory Visit (INDEPENDENT_AMBULATORY_CARE_PROVIDER_SITE_OTHER): Payer: Medicare Other | Admitting: Neurology

## 2016-04-07 VITALS — BP 142/70 | HR 64 | Ht 61.75 in | Wt 299.0 lb

## 2016-04-07 DIAGNOSIS — I25111 Atherosclerotic heart disease of native coronary artery with angina pectoris with documented spasm: Secondary | ICD-10-CM | POA: Diagnosis not present

## 2016-04-07 DIAGNOSIS — M5441 Lumbago with sciatica, right side: Secondary | ICD-10-CM | POA: Diagnosis not present

## 2016-04-07 DIAGNOSIS — M5442 Lumbago with sciatica, left side: Secondary | ICD-10-CM

## 2016-04-07 DIAGNOSIS — G8929 Other chronic pain: Secondary | ICD-10-CM

## 2016-04-07 MED ORDER — ALIROCUMAB 150 MG/ML ~~LOC~~ SOPN: 1.0000 "pen " | PEN_INJECTOR | SUBCUTANEOUS | 3 refills | Status: DC

## 2016-04-07 MED ORDER — METHOCARBAMOL 500 MG PO TABS: 500.0000 mg | ORAL_TABLET | Freq: Three times a day (TID) | ORAL | 1 refills | Status: DC

## 2016-04-07 NOTE — Telephone Encounter (Signed)
Praluent coverage approved through [employer] patient assistance program. Pt can fill rx through [facility], they have requested a 90 day supply. Rx sent in today for higher Praluent 150mg /mL strength given previously elevated LDL on 75mg /mL dose.

## 2016-04-07 NOTE — Patient Instructions (Signed)
   We will start a muscle relaxant, Robaxin 500 mg three times a day. We will get you set up for an epidural steroid injection.

## 2016-04-07 NOTE — Progress Notes (Signed)
Reason for visit: Low back pain, leg pain  Referring physician: Dr. Levin Bacon is a 71 y.o. female  History of present illness:  Ms. Caplin is a 72 year old right-handed white female with a history of cerebrovascular disease and chronic dizziness. The patient has diabetes and marked obesity. The patient began having low back pain within the last 6 months, but this issue has gradually worsened over the last 3 months and the patient has had discomfort going from the back down both legs to the feet, left greater than right. The patient has numbness and tingly sensations down the legs, she feels weak in the legs. She has not had any falls. The patient has increased discomfort with standing. She denies any issues controlling the bowels or the bladder. She has occasional tingling in the left hand as well. She has undergone MRI evaluation of the low back that shows moderate spinal stenosis at the L4-5 level with neuroforaminal stenosis bilaterally with the possibility of L4 nerve root impingement. She is sent to this office for an evaluation.  Past Medical History:  Diagnosis Date  . Abnormality of gait 05/14/2014  . Anemia    hx  . Anginal pain ([place])   . Anxiety   . Asthma   . Bundle branch block, left    chronic/notes 07/18/2013  . Cancer [facility]) April 2016   BCC,  Melanoma  . CHF (congestive heart failure) ([place])   . Chronic bronchitis ([place])    "off and on all the time" (07/18/2013)  . Chronic insomnia 05/06/2015  . Chronic kidney disease    frequency, sses dr Jamal Maes every 4 to 6 months  . Chronic lower back pain   . Claustrophobia   . Common migraine 05/14/2014  . Coronary artery disease   . Dysrhythmia   . GERD (gastroesophageal reflux disease)   . H/O hiatal hernia   . Headache    "probably 4 days/wk" (07/18/2013)  . Heart murmur   . Hyperlipidemia   . Hypertension   . Irregular heart beat   . Leg cramps    both legs at times  . Memory change 05/14/2014  .  Mental disorder    hx of confusion  . Migraine    "used to have them really bad; kind of eased up now; down to 1 q couple months" (07/18/2013)  . Myocardial infarction    04/1999, 02/2000, 01/2005; 2011; 2014  . Neuromuscular disorder ([place])    ?  . Obesity 01-2010  . Obstructive sleep apnea    "can't wear machine; I have claustrophobia" (07/18/2013)  . Osteoarthritis    "knees and hands" (07/18/2013)  . Other and unspecified angina pectoris   . Peripheral vascular disease ([facility])    ? numbness, tingling arms and legs  . Pneumonia 2000's   "once"  . PONV (postoperative nausea and vomiting)   . Shortness of breath    with exertion  . Stroke ([place]) 03/22/12   right side brain; denies residual on 07/18/2013)  . Stroke [facility]) Oct. 2015   Affected pt.s balance  . Stroke ([name]) 10/2014  . Type II diabetes mellitus ([place])   . Ventral hernia    hx of  . Vomiting    persistent  . Vomiting blood     Past Surgical History:  Procedure Laterality Date  . ANKLE FRACTURE SURGERY Left 1970's  . APPENDECTOMY  1970's   w/hysterectomy  . CARDIAC CATHETERIZATION  10/10/2012   Dr [name].  Marland Kitchen  CARDIAC CATHETERIZATION N/A 05/29/2015   Procedure: Right/Left Heart Cath and Coronary/Graft Angiography;  Surgeon: Larey Dresser, MD;  Location: [facility] LAB;  Service: Cardiovascular;  Laterality: N/A;  . CAROTID ENDARTERECTOMY Left 03/2013  . CAROTID STENT INSERTION Left 03/20/2013   Procedure: CAROTID STENT INSERTION;  Surgeon: [name], MD;  Location: [facility] CATH LAB;  Service: Cardiovascular;  Laterality: Left;  internal carotid  . CEREBRAL ANGIOGRAM N/A 04/05/2011   Procedure: CEREBRAL ANGIOGRAM;  Surgeon: Angelia Mould, MD;  Location: [facility] CATH LAB;  Service: Cardiovascular;  Laterality: N/A;  . CHOLECYSTECTOMY  2004  . CORONARY ANGIOPLASTY WITH STENT PLACEMENT  01,02,05,06,07,08,11; 04/24/2013   "I've probably got ~ 10 stents by now" (04/24/2013)  . CORONARY ANGIOPLASTY WITH STENT PLACEMENT   06/13/2013   "got 4 stents today" (06/13/2013)  . CORONARY ARTERY BYPASS GRAFT  1220/11   "CABG X5"  . ESOPHAGOGASTRODUODENOSCOPY  08/03/2011   Procedure: ESOPHAGOGASTRODUODENOSCOPY (EGD);  Surgeon: Shann Medal, MD;  Location: [facility];  Service: General;  Laterality: N/A;  . ESOPHAGOGASTRODUODENOSCOPY (EGD) WITH PROPOFOL N/A 03/11/2014   Procedure: ESOPHAGOGASTRODUODENOSCOPY (EGD) WITH PROPOFOL;  Surgeon: Lafayette Dragon, MD;  Location: [facility] END[facility];  Service: Endoscopy;  Laterality: N/A;  . FRACTURE SURGERY    . gall stone removal  05/2003  . GASTRIC BYPASS  1984   "stapeling"  . HERNIA REPAIR  2004   "in my stomach; had OR on it twice", wire mesh on 1 hernia  . PERCUTANEOUS CORONARY STENT INTERVENTION (PCI-S) N/A 06/13/2013   Procedure: PERCUTANEOUS CORONARY STENT INTERVENTION (PCI-S);  Surgeon: [name], MD;  Location: [facility] CATH LAB;  Service: Cardiovascular;  Laterality: N/A;  . PERCUTANEOUS STENT INTERVENTION N/A 04/24/2013   Procedure: PERCUTANEOUS STENT INTERVENTION;  Surgeon: [name], MD;  Location: [facility] CATH LAB;  Service: Cardiovascular;  Laterality: N/A;  . ROOT CANAL  10/2000  . TIBIA FRACTURE SURGERY Right 1970's   rods and pins  . TOOTH EXTRACTION    . TOTAL ABDOMINAL HYSTERECTOMY  1970's   w/ appendectomy  . TUMOR REMOVAL  1970's   ovarian    Family History  Problem Relation Age of Onset  . Heart disease Father     Heart Disease before age 59  . Diabetes Father   . Hyperlipidemia Father   . Hypertension Father   . Heart attack Father   . Deep vein thrombosis Father   . AAA (abdominal aortic aneurysm) Father   . Hypertension Mother   . Deep vein thrombosis Mother   . AAA (abdominal aortic aneurysm) Mother   . Cancer Sister     Ovarian  . Hypertension Sister   . Diabetes Paternal Grandmother   . Heart disease Paternal Uncle   . Diabetes Paternal Uncle   . Heart attack Paternal Grandfather 15    died of MI at 58  . Diabetes Paternal Aunt     . Diabetes Paternal Uncle   . Colon cancer Neg Hx     Social history:  reports that she has never smoked. She has never used smokeless tobacco. She reports that she does not drink alcohol or use drugs.  Medications:  Prior to Admission medications   Medication Sig Start Date End Date Taking? Authorizing Provider  acetaminophen (TYLENOL) 325 MG tablet Take 650 mg by mouth every 6 (six) hours as needed for mild pain.    Yes Historical Provider, MD  albuterol (PROVENTIL) (2.5 MG/3ML) 0.083% nebulizer solution Take 2.5 mg by nebulization every 6 (  six) hours as needed for shortness of breath (when asthma is active).    Yes Historical Provider, MD  albuterol (PROVENTIL,VENTOLIN) 90 MCG/ACT inhaler Inhale 2 puffs into the lungs every 6 (six) hours as needed for shortness of breath.    Yes Historical Provider, MD  Alirocumab (PRALUENT) 150 MG/ML SOPN Inject 1 pen into the skin every 14 (fourteen) days. 04/07/16  Yes Larey Dresser, MD  ALPRAZolam Duanne Moron) 0.5 MG tablet Take 0.5-1 mg by mouth 3 (three) times daily. 0.5mg  in the morning and at lunch, 1mg  at bedtime   Yes Historical Provider, MD  amLODipine (NORVASC) 10 MG tablet Take 10 mg by mouth daily. 01/20/15  Yes Historical Provider, MD  aspirin EC 81 MG tablet Take 162 mg by mouth daily.   Yes Historical Provider, MD  b complex vitamins capsule Take 1 capsule by mouth daily.   Yes Historical Provider, MD  Calcium Carbonate 1500 (600 CA) MG TABS Take 1,500 mg by mouth daily with breakfast.   Yes Historical Provider, MD  Cholecalciferol (VITAMIN D) 2000 UNITS tablet Take 2,000 Units by mouth daily with breakfast.    Yes Historical Provider, MD  cloNIDine (CATAPRES) 0.2 MG tablet Take 0.2 mg by mouth 2 (two) times daily.   Yes Historical Provider, MD  clopidogrel (PLAVIX) 75 MG tablet TAKE ONE (1) TABLET BY MOUTH EVERY DAY 08/15/15  Yes Larey Dresser, MD  denosumab (PROLIA) 60 MG/ML SOLN injection Inject 60 mg into the skin every 6 (six) months.  Administer in upper arm, thigh, or abdomen   Yes Historical Provider, MD  DULoxetine (CYMBALTA) 60 MG capsule Take 60 mg by mouth daily with breakfast.   Yes Historical Provider, MD  ezetimibe (ZETIA) 10 MG tablet Take 1 tablet (10 mg total) by mouth daily. 02/26/16 05/26/16 Yes Larey Dresser, MD  Fluticasone-Salmeterol (ADVAIR DISKUS) 250-50 MCG/DOSE AEPB Inhale 1 puff into the lungs every 12 (twelve) hours.    Yes Historical Provider, MD  gabapentin (NEURONTIN) 600 MG tablet Take 600-750 mg by mouth 3 (three) times daily. 600mg  in the morning and at noon. 750mg  at bedtime.   Yes Historical Provider, MD  HUMALOG KWIKPEN 100 UNIT/ML SOPN Inject 14-22 Units into the skin 4 (four) times daily -  with meals and at bedtime. 10 units at every meal and 14 units at bedtime if needed 09/19/12  Yes Historical Provider, MD  hydrALAZINE (APRESOLINE) 50 MG tablet Take 50 mg by mouth 3 (three) times daily as needed (for high blood pressure). If BP is >175   Yes Historical Provider, MD  HYDROcodone-acetaminophen (NORCO/VICODIN) 5-325 MG per tablet Take 1 tablet by mouth at bedtime as needed for moderate pain.    Yes Historical Provider, MD  ipratropium-albuterol (DUONEB) 0.5-2.5 (3) MG/3ML SOLN Take 3 mLs by nebulization as needed (If severe asthma , use every 4 hours for 10 days).    Yes Historical Provider, MD  LANTUS SOLOSTAR 100 UNIT/ML Solostar Pen Inject 46-52 Units into the skin every morning.  08/23/14  Yes Historical Provider, MD  loperamide (IMODIUM) 2 MG capsule Take 2 mg by mouth as needed for diarrhea or loose stools.   Yes Historical Provider, MD  losartan (COZAAR) 100 MG tablet Take 100 mg by mouth daily with breakfast.    Yes Historical Provider, MD  magnesium oxide (MAG-OX) 400 MG tablet Take 400 mg by mouth 2 (two) times daily.   Yes Historical Provider, MD  meclizine (ANTIVERT) 12.5 MG tablet Take 12.5 mg by mouth 3 (  three) times daily as needed for dizziness.   Yes Historical Provider, MD    metoprolol succinate (TOPROL-XL) 25 MG 24 hr tablet TAKE ONE TABLET TWICE DAILY 09/12/15  Yes Larey Dresser, MD  Multiple Vitamin (MULTIVITAMIN) tablet Take 1 tablet by mouth daily.    Yes Historical Provider, MD  Multiple Vitamins-Minerals (CENTRUM SILVER PO) Take 1 tablet by mouth daily.   Yes Historical Provider, MD  multivitamin-lutein (OCUVITE-LUTEIN) CAPS capsule Take 1 capsule by mouth daily.   Yes Historical Provider, MD  nitroGLYCERIN (NITROSTAT) 0.3 MG SL tablet Place 1 tablet (0.3 mg total) under the tongue every 5 (five) minutes x 3 doses as needed for chest pain. 02/28/14  Yes Larey Dresser, MD  ondansetron (ZOFRAN) 4 MG tablet Take 1 tablet (4 mg total) by mouth 2 (two) times daily as needed. For nausea 02/12/14  Yes Lafayette Dragon, MD  pantoprazole (PROTONIX) 40 MG tablet Take 1 tablet (40 mg total) by mouth 2 (two) times daily. 11/08/13  Yes Larey Dresser, MD  RANEXA 1000 MG SR tablet TAKE ONE (1) TABLET BY MOUTH TWO (2) TIMES DAILY 02/06/16  Yes Larey Dresser, MD  rosuvastatin (CRESTOR) 40 MG tablet TAKE ONE (1) TABLET EACH DAY 12/09/15  Yes Larey Dresser, MD  torsemide (DEMADEX) 20 MG tablet Take 4 tablets (80 mg total) by mouth daily. 03/12/16  Yes Larey Dresser, MD  vitamin B-12 (CYANOCOBALAMIN) 1000 MCG tablet Take 1,000 mcg by mouth daily.   Yes Historical Provider, MD  zinc gluconate 50 MG tablet Take 50 mg by mouth at bedtime.    Yes Historical Provider, MD  zolpidem (AMBIEN) 10 MG tablet Take 10 mg by mouth at bedtime as needed for sleep.    Yes Historical Provider, MD  methocarbamol (ROBAXIN) 500 MG tablet Take 1 tablet (500 mg total) by mouth 3 (three) times daily. 04/07/16   Kathrynn Ducking, MD      Allergies  Allergen Reactions  . Amoxicillin Shortness Of Breath and Rash  . Brilinta [Ticagrelor] Shortness Of Breath  . Erythromycin Shortness Of Breath, Other (See Comments) and Hives    Trouble swallowing  . Flagyl [Metronidazole] Palpitations and Shortness Of  Breath  . Penicillins Shortness Of Breath, Rash and Hives  . Isosorbide Mononitrate [Isosorbide Nitrate] Other (See Comments)    Joint aches, muscles hurt, difficult to walk   . Jardiance [Empagliflozin] Other (See Comments)    Nausea, joint aches, muscles aches  . Metformin And Related Other (See Comments)    Stomach pain, cold sweats, joint pain, burred vision, dizziness  . Tape Other (See Comments)    Skin pulls off with certain types Plastic tape causes skin to rip if left on for long periods of time  . Erythromycin Base Rash    ROS:  Out of a complete 14 system review of symptoms, the patient complains only of the following symptoms, and all other reviewed systems are negative.  Decreased activity, fatigue, weight gain, excessive sweating Nausea, vomiting Restless legs, insomnia, frequent waking Environmental allergies Joint pain, joint swelling, back pain, aching muscles, muscle cramps, walking difficulties, neck pain Bruising easily Memory loss, dizziness, headache, numbness, speech difficulty, weakness Depression, anxiety  Blood pressure (!) 142/70, pulse 64, height 5' 1.75" (1.568 m), weight 299 lb (135.6 kg), SpO2 94 %.  Physical Exam  General: The patient is alert and cooperative at the time of the examination. The patient is morbidly obese.  Eyes: Pupils are equal, round, and reactive  to light. Discs are flat bilaterally.  Neck: The neck is supple, no carotid bruits are noted.  Respiratory: The respiratory examination is clear.  Cardiovascular: The cardiovascular examination reveals a regular rate and rhythm, no obvious murmurs or rubs are noted.  Skin: Extremities are with 2+ edema below the knees bilaterally.  Neurologic Exam  Mental status: The patient is alert and oriented x 3 at the time of the examination. The patient has apparent normal recent and remote memory, with an apparently normal attention span and concentration ability.  Cranial nerves:  Facial symmetry is present. There is good sensation of the face to pinprick and soft touch bilaterally. The strength of the facial muscles and the muscles to head turning and shoulder shrug are normal bilaterally. Speech is well enunciated, no aphasia or dysarthria is noted. Extraocular movements are full. Visual fields are full. The tongue is midline, and the patient has symmetric elevation of the soft palate. No obvious hearing deficits are noted.  Motor: The motor testing reveals 5 over 5 strength of all 4 extremities. Good symmetric motor tone is noted throughout.  Sensory: Sensory testing is intact to pinprick, soft touch, vibration sensation, and position sense on the upper extremities. With the lower extremities, there is a stocking pattern pinprick sensory deficit one half way up the legs bilaterally with significant impairment in position sense and vibration sensation on both feet. No evidence of extinction is noted.  Coordination: Cerebellar testing reveals good finger-nose-finger and heel-to-shin bilaterally.  Gait and station: Gait is associated with a wide-based, unsteady gait. Tandem gait is unsteady. Romberg is positive, the patient will fall forward. No drift is seen.  Reflexes: Deep tendon reflexes are symmetric, but are depressed bilaterally. Toes are downgoing bilaterally.   Assessment/Plan:  1. Morbid obesity  2. Low back pain, bilateral lower extremity discomfort, left greater than right  3. Chronic dizziness  4. Peripheral neuropathy by clinical examination  5. Gait disturbance  The patient has had worsening of her low back pain with pain down both legs. The patient will be given a prescription for Robaxin for the next 3 weeks, and she will be set up for an epidural steroid injection. If this is not helpful, EMG and nerve conduction study evaluation will be done in the future. She will follow-up in 4 months.  Jill Alexanders MD 04/07/2016 3:42 PM  [facility] [address] [place],  [address]  Phone [phone] Fax [phone]

## 2016-04-09 ENCOUNTER — Other Ambulatory Visit: Payer: Self-pay | Admitting: Neurology

## 2016-04-09 DIAGNOSIS — M5442 Lumbago with sciatica, left side: Principal | ICD-10-CM

## 2016-04-09 DIAGNOSIS — M5441 Lumbago with sciatica, right side: Principal | ICD-10-CM

## 2016-04-09 DIAGNOSIS — G8929 Other chronic pain: Secondary | ICD-10-CM

## 2016-04-21 ENCOUNTER — Telehealth (HOSPITAL_COMMUNITY): Payer: Self-pay | Admitting: *Deleted

## 2016-04-21 NOTE — Telephone Encounter (Signed)
-----   Message from Larey Dresser, MD sent at 04/21/2016 10:12 AM EDT ----- Regarding: RE: Stable Angina She can take a NTG before coming to rehab but she does really seem to need it that much generally.  ----- Message ----- From: Rowe Pavy, RN Sent: 04/21/2016   9:12 AM To: Larey Dresser, MD Subject: Stable Angina                                  Dr. [name],  I reached out to pt for scheduling cardiac rehab.  Pt stated that she has angina and has some relief with rest but  takes NTG when her angina is 'Bad" .  Last episode was on Friday she took NTG with relief. Per our protocol pt with angina are treated, 12 lead ekg completed and transfer to ER.  If this is typical for this patient we will need a plan of care for treatment  of angina in the rehab setting.  In the past some of our patients with stable angina would take NTG (depending on bp) prior to exercise, others may use oxygen therapy, certainly workload modifications and maximize anginal therapy -she is on Norvasc and ranexa..  What are your thoughts Cherre Huger, BSN Cardiac and Pulmonary Rehab Nurse Navigator

## 2016-04-22 ENCOUNTER — Telehealth (HOSPITAL_COMMUNITY): Payer: Self-pay | Admitting: Family Medicine

## 2016-04-23 ENCOUNTER — Other Ambulatory Visit (HOSPITAL_COMMUNITY): Payer: Self-pay | Admitting: *Deleted

## 2016-04-23 DIAGNOSIS — I208 Other forms of angina pectoris: Secondary | ICD-10-CM

## 2016-04-26 ENCOUNTER — Ambulatory Visit (INDEPENDENT_AMBULATORY_CARE_PROVIDER_SITE_OTHER): Payer: Medicare Other | Admitting: Internal Medicine

## 2016-04-26 ENCOUNTER — Encounter: Payer: Self-pay | Admitting: Internal Medicine

## 2016-04-26 VITALS — BP 160/70 | HR 68 | Ht 61.75 in | Wt 297.4 lb

## 2016-04-26 DIAGNOSIS — E6609 Other obesity due to excess calories: Secondary | ICD-10-CM | POA: Diagnosis not present

## 2016-04-26 DIAGNOSIS — IMO0001 Reserved for inherently not codable concepts without codable children: Secondary | ICD-10-CM

## 2016-04-26 DIAGNOSIS — Z1211 Encounter for screening for malignant neoplasm of colon: Secondary | ICD-10-CM | POA: Diagnosis not present

## 2016-04-26 DIAGNOSIS — I5032 Chronic diastolic (congestive) heart failure: Secondary | ICD-10-CM | POA: Diagnosis not present

## 2016-04-26 DIAGNOSIS — R490 Dysphonia: Secondary | ICD-10-CM

## 2016-04-26 DIAGNOSIS — K219 Gastro-esophageal reflux disease without esophagitis: Secondary | ICD-10-CM

## 2016-04-26 DIAGNOSIS — R0602 Shortness of breath: Secondary | ICD-10-CM | POA: Diagnosis not present

## 2016-04-26 DIAGNOSIS — I208 Other forms of angina pectoris: Secondary | ICD-10-CM | POA: Diagnosis not present

## 2016-04-26 DIAGNOSIS — R110 Nausea: Secondary | ICD-10-CM

## 2016-04-26 MED ORDER — PANTOPRAZOLE SODIUM 40 MG PO TBEC: 40.0000 mg | DELAYED_RELEASE_TABLET | Freq: Two times a day (BID) | ORAL | 0 refills | Status: DC

## 2016-04-26 NOTE — Progress Notes (Signed)
HISTORY OF PRESENT ILLNESS:  [name] [name] is a 71 y.o. female with MULTIPLE SIGNIFICANT medical problems including morbid obesity, coronary artery disease, and history of TIA/CVA on chronic Plavix and aspirin. I saw the patient initially 04/09/2015 regarding a 20 year history of problems with nausea associated with gagging symptoms. Previous patient of Dr. Olevia Perches. See that dictation. She is on multiple medications. For chronic nausea recommended taking Zofran regularly. Currently taking 1 in the morning and one at night. She states this has helped. She was also prescribed Xifaxan 4 complaints of intestinal gas with concerns of bacterial overgrowth. She states that this did not help. She was advised with regards to weight loss. She has not been successful. Continues to gain weight. She presents today regarding follow-up screening colonoscopy and complaints of forced is possibly related to GERD. She does take once daily PPI. Recent negative ENT evaluation. ENT suggested higher dose of PPI. She inquires.. Initial screening colonoscopy with Dr. Olevia Perches (reviewed) May 2007. Examination was said to be complete and entirely normal with good preparation. Last problem with TIA last fall. Current BMI 55  REVIEW OF SYSTEMS:  All non-GI ROS negative except for back pain  Past Medical History:  Diagnosis Date  . Abnormality of gait 05/14/2014  . Anemia    hx  . Anginal pain ([place])   . Anxiety   . Asthma   . Bundle branch block, left    chronic/notes 07/18/2013  . Cancer [facility]) April 2016   BCC,  Melanoma  . CHF (congestive heart failure) ([place])   . Chronic bronchitis ([place])    "off and on all the time" (07/18/2013)  . Chronic insomnia 05/06/2015  . Chronic kidney disease    frequency, sses dr Jamal Maes every 4 to 6 months  . Chronic lower back pain   . Claustrophobia   . Common migraine 05/14/2014  . Coronary artery disease   . Dysrhythmia   . GERD (gastroesophageal reflux disease)   . H/O hiatal  hernia   . Headache    "probably 4 days/wk" (07/18/2013)  . Heart murmur   . Hyperlipidemia   . Hypertension   . Irregular heart beat   . Leg cramps    both legs at times  . Memory change 05/14/2014  . Mental disorder    hx of confusion  . Migraine    "used to have them really bad; kind of eased up now; down to 1 q couple months" (07/18/2013)  . Myocardial infarction    04/1999, 02/2000, 01/2005; 2011; 2014  . Neuromuscular disorder ([place])    ?  . Obesity 01-2010  . Obstructive sleep apnea    "can't wear machine; I have claustrophobia" (07/18/2013)  . Osteoarthritis    "knees and hands" (07/18/2013)  . Other and unspecified angina pectoris   . Peripheral vascular disease (HCC)    ? numbness, tingling arms and legs  . Pneumonia 2000's   "once"  . PONV (postoperative nausea and vomiting)   . Shortness of breath    with exertion  . Stroke ([place]) 03/22/12   right side brain; denies residual on 07/18/2013)  . Stroke [facility]) Oct. 2015   Affected pt.s balance  . Stroke ([place]) 10/2014  . Type II diabetes mellitus ([place])   . Ventral hernia    hx of  . Vomiting    persistent  . Vomiting blood     Past Surgical History:  Procedure Laterality Date  . ANKLE FRACTURE SURGERY Left 1970's  .  APPENDECTOMY  1970's   w/hysterectomy  . CARDIAC CATHETERIZATION  10/10/2012   Dr [name].  Marland Kitchen CARDIAC CATHETERIZATION N/A 05/29/2015   Procedure: Right/Left Heart Cath and Coronary/Graft Angiography;  Surgeon: Larey Dresser, MD;  Location: [place] [facility] LAB;  Service: Cardiovascular;  Laterality: N/A;  . CAROTID ENDARTERECTOMY Left 03/2013  . CAROTID STENT INSERTION Left 03/20/2013   Procedure: CAROTID STENT INSERTION;  Surgeon: [name], MD;  Location: [facility] CATH LAB;  Service: Cardiovascular;  Laterality: Left;  internal carotid  . CEREBRAL ANGIOGRAM N/A 04/05/2011   Procedure: CEREBRAL ANGIOGRAM;  Surgeon: Angelia Mould, MD;  Location: [facility] CATH LAB;  Service: Cardiovascular;  Laterality: N/A;   . CHOLECYSTECTOMY  2004  . CORONARY ANGIOPLASTY WITH STENT PLACEMENT  01,02,05,06,07,08,11; 04/24/2013   "I've probably got ~ 10 stents by now" (04/24/2013)  . CORONARY ANGIOPLASTY WITH STENT PLACEMENT  06/13/2013   "got 4 stents today" (06/13/2013)  . CORONARY ARTERY BYPASS GRAFT  1220/11   "CABG X5"  . ESOPHAGOGASTRODUODENOSCOPY  08/03/2011   Procedure: ESOPHAGOGASTRODUODENOSCOPY (EGD);  Surgeon: Shann Medal, MD;  Location: [facility];  Service: General;  Laterality: N/A;  . ESOPHAGOGASTRODUODENOSCOPY (EGD) WITH PROPOFOL N/A 03/11/2014   Procedure: ESOPHAGOGASTRODUODENOSCOPY (EGD) WITH PROPOFOL;  Surgeon: Lafayette Dragon, MD;  Location: [facility] ENDOSCOPY;  Service: Endoscopy;  Laterality: N/A;  . FRACTURE SURGERY    . gall stone removal  05/2003  . GASTRIC BYPASS  1984   "stapeling"  . HERNIA REPAIR  2004   "in my stomach; had OR on it twice", wire mesh on 1 hernia  . PERCUTANEOUS CORONARY STENT INTERVENTION (PCI-S) N/A 06/13/2013   Procedure: PERCUTANEOUS CORONARY STENT INTERVENTION (PCI-S);  Surgeon: [name], MD;  Location: [facility] CATH LAB;  Service: Cardiovascular;  Laterality: N/A;  . PERCUTANEOUS STENT INTERVENTION N/A 04/24/2013   Procedure: PERCUTANEOUS STENT INTERVENTION;  Surgeon: [name], MD;  Location[facility] CATH LAB;  Service: Cardiovascular;  Laterality: N/A;  . ROOT CANAL  10/2000  . TIBIA FRACTURE SURGERY Right 1970's   rods and pins  . TOOTH EXTRACTION    . TOTAL ABDOMINAL HYSTERECTOMY  1970's   w/ appendectomy  . TUMOR REMOVAL  1970's   ovarian    Social History [name] [name]  reports that she has never smoked. She has never used smokeless tobacco. She reports that she does not drink alcohol or use drugs.  family history includes AAA (abdominal aortic aneurysm) in her father and mother; Cancer in her sister; Deep vein thrombosis in her father and mother; Diabetes in her father, paternal aunt, paternal grandmother, paternal uncle, and paternal uncle; Heart  attack in her father; Heart attack (age of onset: 49) in her paternal grandfather; Heart disease in her father and paternal uncle; Hyperlipidemia in her father; Hypertension in her father, mother, and sister.  Allergies  Allergen Reactions  . Amoxicillin Shortness Of Breath and Rash  . Brilinta [Ticagrelor] Shortness Of Breath  . Erythromycin Shortness Of Breath, Other (See Comments) and Hives    Trouble swallowing  . Flagyl [Metronidazole] Palpitations and Shortness Of Breath  . Penicillins Shortness Of Breath, Rash and Hives  . Isosorbide Mononitrate [Isosorbide Nitrate] Other (See Comments)    Joint aches, muscles hurt, difficult to walk   . Jardiance [Empagliflozin] Other (See Comments)    Nausea, joint aches, muscles aches  . Metformin And Related Other (See Comments)    Stomach pain, cold sweats, joint pain, burred vision, dizziness  . Tape Other (See Comments)  Skin pulls off with certain types Plastic tape causes skin to rip if left on for long periods of time  . Erythromycin Base Rash       PHYSICAL EXAMINATION: Vital signs: BP (!) 160/70 (BP Location: Left Arm, Patient Position: Sitting, Cuff Size: Large)   Pulse 68   Ht 5' 1.75" (1.568 m)   Wt 297 lb 6.4 oz (134.9 kg)   BMI 54.84 kg/m   Constitutional: Pleasant, obese, generally well-appearing, no acute distress Psychiatric: alert and oriented x3, cooperative Eyes: extraocular movements intact, anicteric, conjunctiva pink Mouth: oral pharynx moist, no lesions Neck: supple no lymphadenopathy Cardiovascular: heart regular rate and rhythm, no murmur Lungs: clear to auscultation bilaterally Abdomen: soft, obese, nontender, nondistended, no obvious ascites, no peritoneal signs, normal bowel sounds, no organomegaly Rectal: Omitted Extremities: no clubbing, cyanosis, or lower extremity edema bilaterally Skin: no lesions on visible extremities Neuro: No focal deficits. Cranial nerves intact. No  asterixis.   ASSESSMENT:  #1. GERD. Classic symptoms controlled with once daily PPI #2. Hoarseness. Possibly GERD per ENT #3. Chronic nausea. Ongoing but improved with twice daily Zofran #4. Multiple multiple significant medical problems and comorbidities #5. Colon cancer screening. Negative index examination May 2007   PLAN:  #1. Reflux precautions #2. Weight loss #3. Three-month trial of increased PPI. Prescribed pantoprazole 40 mg twice daily. If no improvement resume once daily therapy or lowest dose to control classic reflux symptoms #4. Continue Zofran for chronic nausea #5. Discussed colon cancer screening options. Recommended Cologuard given her medically high risk state for routine screening optical colonoscopy. I discussed with her the pros and cons of several colon cancer screening strategies but mostly optical colonoscopy, FIT, and Cologuard  25 minutes was spent face-to-face with the patient. Greater than 50% a time use for counseling regarding her chronic GERD, issues with hoarseness possibly related to GERD, and colon cancer screening strategies.

## 2016-04-26 NOTE — Patient Instructions (Signed)
Increase your PPI to twice a day.  Your provider has ordered Cologuard testing as an option for colon cancer screening. This is performed by [facility] and may be out of network with your insurance. PRIOR to completing the test, it is YOUR responsibility to contact your insurance about covered benefits for this test. Your out of pocket expense could be anywhere from $0.00 to $649.00. The CPT code to provide to your insurance carrier is [phone].  We have already sent your demographic and insurance information to [employer] [facility] (phone number [phone]) and they should contact you within the next week regarding your test. If you have not heard from them within the next week, please call our office at [phone].

## 2016-04-27 ENCOUNTER — Telehealth (HOSPITAL_COMMUNITY): Payer: Self-pay | Admitting: Family Medicine

## 2016-04-27 NOTE — Telephone Encounter (Signed)
Verified Medicare A/B & AARP supplemental insurance benefits through Passport Reference [phone] & [phone].... KJ

## 2016-05-03 ENCOUNTER — Telehealth: Payer: Self-pay | Admitting: Neurology

## 2016-05-03 DIAGNOSIS — I671 Cerebral aneurysm, nonruptured: Secondary | ICD-10-CM

## 2016-05-03 NOTE — Telephone Encounter (Signed)
I called patient. I have called to recommend MRA evaluation of the head to evaluate for the posterior communicating aneurysm seen on cerebral angiogram.  The patient is amenable to the study, she is to call our office and I will get the study set up.

## 2016-05-03 NOTE — Telephone Encounter (Signed)
I will order MRA of the head.

## 2016-05-03 NOTE — Addendum Note (Signed)
Addended by: Kathrynn Ducking on: 05/03/2016 05:13 PM   Modules accepted: Orders

## 2016-05-03 NOTE — Telephone Encounter (Signed)
Patient called office would like to process with MRA.

## 2016-05-05 NOTE — Telephone Encounter (Signed)
[name] [name] with [facility] called office asking if Dr. Jannifer Franklin would like a MRI brain with and w/out contrast or a MRI head w/out contrast.  Please call

## 2016-05-05 NOTE — Telephone Encounter (Signed)
I called Mardene Celeste, I indicated that all that I need is an MRA of the head, I do not need MRI of the brain, this is only for follow-up for a small posterior communicating aneurysm.

## 2016-05-05 NOTE — Telephone Encounter (Signed)
Dr Willis- please advise 

## 2016-05-06 ENCOUNTER — Telehealth (HOSPITAL_COMMUNITY): Payer: Self-pay | Admitting: Family Medicine

## 2016-05-06 ENCOUNTER — Encounter: Payer: Self-pay | Admitting: Family

## 2016-05-06 NOTE — Telephone Encounter (Signed)
I left message on patient voicemail to call office and let us know if interested in the Cardiac Rehab program. We have contacted the patient 2X.

## 2016-05-13 ENCOUNTER — Other Ambulatory Visit: Payer: Self-pay

## 2016-05-13 LAB — COLOGUARD: Cologuard: NEGATIVE

## 2016-05-14 ENCOUNTER — Telehealth (HOSPITAL_COMMUNITY): Payer: Self-pay | Admitting: *Deleted

## 2016-05-14 NOTE — Telephone Encounter (Signed)
Permission to stop plavix for 5 days prior to Lumbar ESI was faxed back to [facility] today.  Dr. [name] said it is ok to hold and to restart after procedure.

## 2016-05-17 ENCOUNTER — Encounter: Payer: Self-pay | Admitting: Family

## 2016-05-17 ENCOUNTER — Ambulatory Visit (HOSPITAL_COMMUNITY)
Admission: RE | Admit: 2016-05-17 | Discharge: 2016-05-17 | Disposition: A | Discharge status: Home / Self Care | Payer: Medicare Other | Source: Ambulatory Visit | Attending: Family | Admitting: Family

## 2016-05-17 ENCOUNTER — Ambulatory Visit (INDEPENDENT_AMBULATORY_CARE_PROVIDER_SITE_OTHER): Payer: Medicare Other | Admitting: Family

## 2016-05-17 VITALS — BP 126/69 | HR 63 | Temp 97.5°F | Resp 20 | Ht 61.0 in | Wt 297.0 lb

## 2016-05-17 DIAGNOSIS — I6522 Occlusion and stenosis of left carotid artery: Secondary | ICD-10-CM

## 2016-05-17 DIAGNOSIS — Z6841 Body Mass Index (BMI) 40.0 and over, adult: Secondary | ICD-10-CM

## 2016-05-17 DIAGNOSIS — Z8673 Personal history of transient ischemic attack (TIA), and cerebral infarction without residual deficits: Secondary | ICD-10-CM | POA: Diagnosis not present

## 2016-05-17 DIAGNOSIS — I208 Other forms of angina pectoris: Secondary | ICD-10-CM | POA: Diagnosis not present

## 2016-05-17 DIAGNOSIS — I6521 Occlusion and stenosis of right carotid artery: Secondary | ICD-10-CM | POA: Insufficient documentation

## 2016-05-17 DIAGNOSIS — Z959 Presence of cardiac and vascular implant and graft, unspecified: Secondary | ICD-10-CM | POA: Insufficient documentation

## 2016-05-17 DIAGNOSIS — I6523 Occlusion and stenosis of bilateral carotid arteries: Secondary | ICD-10-CM | POA: Diagnosis not present

## 2016-05-17 NOTE — Patient Instructions (Signed)
Stroke Prevention Some medical conditions and behaviors are associated with an increased chance of having a stroke. You may prevent a stroke by making healthy choices and managing medical conditions. How can I reduce my risk of having a stroke?  Stay physically active. Get at least 30 minutes of activity on most or all days.  Do not smoke. It may also be helpful to avoid exposure to secondhand smoke.  Limit alcohol use. Moderate alcohol use is considered to be:  No more than 2 drinks per day for men.  No more than 1 drink per day for nonpregnant women.  Eat healthy foods. This involves:  Eating 5 or more servings of fruits and vegetables a day.  Making dietary changes that address high blood pressure (hypertension), high cholesterol, diabetes, or obesity.  Manage your cholesterol levels.  Making food choices that are high in fiber and low in saturated fat, trans fat, and cholesterol may control cholesterol levels.  Take any prescribed medicines to control cholesterol as directed by your health care provider.  Manage your diabetes.  Controlling your carbohydrate and sugar intake is recommended to manage diabetes.  Take any prescribed medicines to control diabetes as directed by your health care provider.  Control your hypertension.  Making food choices that are low in salt (sodium), saturated fat, trans fat, and cholesterol is recommended to manage hypertension.  Ask your health care provider if you need treatment to lower your blood pressure. Take any prescribed medicines to control hypertension as directed by your health care provider.  If you are 18-39 years of age, have your blood pressure checked every 3-5 years. If you are 40 years of age or older, have your blood pressure checked every year.  Maintain a healthy weight.  Reducing calorie intake and making food choices that are low in sodium, saturated fat, trans fat, and cholesterol are recommended to manage  weight.  Stop drug abuse.  Avoid taking birth control pills.  Talk to your health care provider about the risks of taking birth control pills if you are over 35 years old, smoke, get migraines, or have ever had a blood clot.  Get evaluated for sleep disorders (sleep apnea).  Talk to your health care provider about getting a sleep evaluation if you snore a lot or have excessive sleepiness.  Take medicines only as directed by your health care provider.  For some people, aspirin or blood thinners (anticoagulants) are helpful in reducing the risk of forming abnormal blood clots that can lead to stroke. If you have the irregular heart rhythm of atrial fibrillation, you should be on a blood thinner unless there is a good reason you cannot take them.  Understand all your medicine instructions.  Make sure that other conditions (such as anemia or atherosclerosis) are addressed. Get help right away if:  You have sudden weakness or numbness of the face, arm, or leg, especially on one side of the body.  Your face or eyelid droops to one side.  You have sudden confusion.  You have trouble speaking (aphasia) or understanding.  You have sudden trouble seeing in one or both eyes.  You have sudden trouble walking.  You have dizziness.  You have a loss of balance or coordination.  You have a sudden, severe headache with no known cause.  You have new chest pain or an irregular heartbeat. Any of these symptoms may represent a serious problem that is an emergency. Do not wait to see if the symptoms will go away.   Get medical help at once. Call your local emergency services (911 in [place] Do not drive yourself to the hospital. This information is not intended to replace advice given to you by your health care provider. Make sure you discuss any questions you have with your health care provider. Document Released: 02/26/2004 Document Revised: 06/26/2015 Document Reviewed: 07/21/2012 Elsevier  Interactive Patient Education  2017 Elsevier Inc.      Preventing Cerebrovascular Disease Arteries are blood vessels that carry blood that contains oxygen from the heart to all parts of the body. Cerebrovascular disease affects arteries that supply the brain. Any condition that blocks or disrupts blood flow to the brain can cause cerebrovascular disease. Brain cells that lose blood supply start to die within minutes (stroke). Stroke is the main danger of cerebrovascular disease. Atherosclerosis and high blood pressure are common causes of cerebrovascular disease. Atherosclerosis is narrowing and hardening of an artery that results when fat, cholesterol, calcium, or other substances (plaque) build up inside an artery. Plaque reduces blood flow through the artery. High blood pressure increases the risk of bleeding inside the brain. Making diet and lifestyle changes to prevent atherosclerosis and high blood pressure lowers your risk of cerebrovascular disease. What nutrition changes can be made?  Eat more fruits, vegetables, and whole grains.  Reduce how much saturated fat you eat. To do this, eat less red meat and fewer full-fat dairy products.  Eat healthy proteins instead of red meat. Healthy proteins include:  Fish. Eat fish that contains heart-healthy omega-3 fatty acids, twice a week. Examples include salmon, albacore tuna, mackerel, and herring.  Chicken.  Nuts.  Low-fat or nonfat yogurt.  Avoid processed meats, like bacon and lunchmeat.  Avoid foods that contain:  A lot of sugar, such as sweets and drinks with added sugar.  A lot of salt (sodium). Avoid adding extra salt to your food, as told by your health care provider.  Trans fats, such as margarine and baked goods. Trans fats may be listed as "partially hydrogenated oils" on food labels.  Check food labels to see how much sodium, sugar, and trans fats are in foods.  Use vegetable oils that contain low amounts of  saturated fat, such as olive oil or canola oil. What lifestyle changes can be made?  Drink alcohol in moderation. This means no more than 1 drink a day for nonpregnant women and 2 drinks a day for men. One drink equals 12 oz of beer, 5 oz of wine, or 1 oz of hard liquor.  If you are overweight, ask your health care provider to recommend a weight-loss plan for you. Losing 5-10 lb (2.2-4.5 kg) can reduce your risk of diabetes, atherosclerosis, and high blood pressure.  Exercise for 30?60 minutes on most days, or as much as told by your health care provider.  Do moderate-intensity exercise, such as brisk walking, bicycling, and water aerobics. Ask your health care provider which activities are safe for you.  Do not use any products that contain nicotine or tobacco, such as cigarettes and e-cigarettes. If you need help quitting, ask your health care provider. Why are these changes important? Making these changes lowers your risk of many diseases that can cause cerebrovascular disease and stroke. Stroke is a leading cause of death and disability. Making these changes also improves your overall health and quality of life. What can I do to lower my risk? The following factors make you more likely to develop cerebrovascular disease:  Being overweight.  Smoking.  Being physically   inactive.  Eating a high-fat diet.  Having certain health conditions, such as:  Diabetes.  High blood pressure.  Heart disease.  Atherosclerosis.  High cholesterol.  Sickle cell disease. Talk with your health care provider about your risk for cerebrovascular disease. Work with your health care provider to control diseases that you have that may contribute to cerebrovascular disease. Your health care provider may prescribe medicines to help prevent major causes of cerebrovascular disease. Where to find more information: Learn more about preventing cerebrovascular disease from:  National Heart, Lung, and  Blood Institute: www.nhlbi.nih.gov/health/health-topics/topics/stroke  Centers for Disease Control and Prevention: cdc.gov/stroke/about.htm Summary  Cerebrovascular disease can lead to a stroke.  Atherosclerosis and high blood pressure are major causes of cerebrovascular disease.  Making diet and lifestyle changes can reduce your risk of cerebrovascular disease.  Work with your health care provider to get your risk factors under control to reduce your risk of cerebrovascular disease. This information is not intended to replace advice given to you by your health care provider. Make sure you discuss any questions you have with your health care provider. Document Released: 02/02/2015 Document Revised: 08/08/2015 Document Reviewed: 02/02/2015 Elsevier Interactive Patient Education  2017 Elsevier Inc.  

## 2016-05-17 NOTE — Progress Notes (Signed)
Chief Complaint: Follow up Extracranial Carotid Artery Stenosis   History of Present Illness   [name] [name] is a 71 y.o. female patient of Dr. Trula Slade who is s/p left carotid stent placement on 03/20/2013 with distal embolic protection. This was done in the setting of a symptomatic left carotid stenosis. The patient was having nearly daily episodes of aphasia.  She returns today for follow up. Dr. Jannifer Franklin, her neurologist, requested a carotid Duplex that was performed on 09/27/13, copy of result reviewed which showed minimal bilateral ICA stenosis.   She continues to have TIA's, pt states Dr. Jannifer Franklin is not able to determine etiology; she has constant vertigo; occasionally has expressive aphasia.  She also has photophobia.  October 2015 stroke occurred in cardiac rehab as manifested by pt could no longer stand up, feeling dizzy; saw Dr. Jannifer Franklin that day, had an MRI, pt states Dr. Jannifer Franklin told her that she had 2 more strokes. She continues to have intermittent expressive aphasia.  Pt states Dr. Jannifer Franklin is not sure of the etiology of her stokes, but states she has two brain aneurysms.   July, 2015 she had what she thinks was a stroke as manifested by feeling hot, off balance, headache, ataxia.  Dr. Jannifer Franklin requested an MRI of her brain to evaluate this and pt states she was told that she had another stroke; she still feels off balance, occasional headaches, occasional crawling sensation on left side of face with exertion, occasional mild expressive aphasia.  She had another stroke in May or June of 2017 as manifested by dizziness and weakness in both legs.  She is scheduled to have another MRI of the brain on 05-20-16 requested by Dr. Jannifer Franklin, to evaluate more dizziness, bilateral legs weakness.  February, 2014 she lost vision in both eyes, her vision has since improved with some loss of acuity. She denies hemiparesis.  She is taking muscle relaxers as prescribed by her PCP, for severe low  back pain. She is putting off ESI's in concern for possible strokes from this as she has had so many strokes and TIA's.    She has continued followup with Dr. Estanislado Pandy for vertebral artery stenosis as well as intracranial aneurysm.   She was hospitalized several times at [facility] for ischemic CAD, 4 stents placed; chest pain. Her cardiologist is Dr. [name].  She has a hx of bariatric surgery in the 1980's and has nausea and sometimes vomiting every morning on an empty stomach.  Pt Diabetic: Yes, pt states her February 2018 A1C was 7.3, Dr. Buddy Duty is her endocrinologist.  Pt smoker: non-smoker  Pt meds include: Statin : Yes, also taking injectable Praluent for difficult to control cholesterol ASA: Yes, 2- 81 mg Other anticoagulants/antiplatelets: Plavix    Past Medical History:  Diagnosis Date  . Abnormality of gait 05/14/2014  . Anemia    hx  . Anginal pain ([place])   . Anxiety   . Asthma   . Bundle branch block, left    chronic/notes 07/18/2013  . Cancer [facility]) April 2016   BCC,  Melanoma  . CHF (congestive heart failure) ([place])   . Chronic bronchitis ([place])    "off and on all the time" (07/18/2013)  . Chronic insomnia 05/06/2015  . Chronic kidney disease    frequency, sses dr Jamal Maes every 4 to 6 months  . Chronic lower back pain   . Claustrophobia   . Common migraine 05/14/2014  . Coronary artery disease   . Dysrhythmia   . GERD (  gastroesophageal reflux disease)   . H/O hiatal hernia   . Headache    "probably 4 days/wk" (07/18/2013)  . Heart murmur   . Hyperlipidemia   . Hypertension   . Irregular heart beat   . Leg cramps    both legs at times  . Memory change 05/14/2014  . Mental disorder    hx of confusion  . Migraine    "used to have them really bad; kind of eased up now; down to 1 q couple months" (07/18/2013)  . Myocardial infarction ([place])    04/1999, 02/2000, 01/2005; 2011; 2014  . Neuromuscular disorder ([place])    ?  . Obesity 01-2010  . Obstructive sleep  apnea    "can't wear machine; I have claustrophobia" (07/18/2013)  . Osteoarthritis    "knees and hands" (07/18/2013)  . Other and unspecified angina pectoris   . Peripheral vascular disease (H[facility])    ? numbness, tingling arms and legs  . Pneumonia 2000's   "once"  . PONV (postoperative nausea and vomiting)   . Shortness of breath    with exertion  . Stroke ([place]) 03/22/12   right side brain; denies residual on 07/18/2013)  . Stroke [facility]) Oct. 2015   Affected pt.s balance  . Stroke ([place]) 10/2014  . Type II diabetes mellitus ([place])   . Ventral hernia    hx of  . Vomiting    persistent  . Vomiting blood     Social History Social History  Substance Use Topics  . Smoking status: Never Smoker  . Smokeless tobacco: Never Used  . Alcohol use No    Family History Family History  Problem Relation Age of Onset  . Heart disease Father     Heart Disease before age 81  . Diabetes Father   . Hyperlipidemia Father   . Hypertension Father   . Heart attack Father   . Deep vein thrombosis Father   . AAA (abdominal aortic aneurysm) Father   . Hypertension Mother   . Deep vein thrombosis Mother   . AAA (abdominal aortic aneurysm) Mother   . Cancer Sister     Ovarian  . Hypertension Sister   . Diabetes Paternal Grandmother   . Heart disease Paternal Uncle   . Diabetes Paternal Uncle   . Heart attack Paternal Grandfather 47    died of MI at 25  . Diabetes Paternal Aunt   . Diabetes Paternal Uncle   . Colon cancer Neg Hx     Surgical History Past Surgical History:  Procedure Laterality Date  . ANKLE FRACTURE SURGERY Left 1970's  . APPENDECTOMY  1970's   w/hysterectomy  . CARDIAC CATHETERIZATION  10/10/2012   Dr [name].  Marland Kitchen CARDIAC CATHETERIZATION N/A 05/29/2015   Procedure: Right/Left Heart Cath and Coronary/Graft Angiography;  Surgeon: Larey Dresser, MD;  Location: [place] CV LAB;  Service: Cardiovascular;  Laterality: N/A;  . CAROTID ENDARTERECTOMY Left 03/2013  . CAROTID  STENT INSERTION Left 03/20/2013   Procedure: CAROTID STENT INSERTION;  Surgeon: [name], MD;  Location: [facility] CATH LAB;  Service: Cardiovascular;  Laterality: Left;  internal carotid  . CEREBRAL ANGIOGRAM N/A 04/05/2011   Procedure: CEREBRAL ANGIOGRAM;  Surgeon: Angelia Mould, MD;  Location[facility] CATH LAB;  Service: Cardiovascular;  Laterality: N/A;  . CHOLECYSTECTOMY  2004  . CORONARY ANGIOPLASTY WITH STENT PLACEMENT  01,02,05,06,07,08,11; 04/24/2013   "I've probably got ~ 10 stents by now" (04/24/2013)  . CORONARY ANGIOPLASTY WITH STENT PLACEMENT  06/13/2013   "  got 4 stents today" (06/13/2013)  . CORONARY ARTERY BYPASS GRAFT  1220/11   "CABG X5"  . ESOPHAGOGASTRODUODENOSCOPY  08/03/2011   Procedure: ESOPHAGOGASTRODUODENOSCOPY (EGD);  Surgeon: Shann Medal, MD;  Location: [facility];  Service: General;  Laterality: N/A;  . ESOPHAGOGASTRODUODENOSCOPY (EGD) WITH PROPOFOL N/A 03/11/2014   Procedure: ESOPHAGOGASTRODUODENOSCOPY (EGD) WITH PROPOFOL;  Surgeon: Lafayette Dragon, MD;  Location: [facility] ENDOSCOPY;  Service: Endoscopy;  Laterality: N/A;  . FRACTURE SURGERY    . gall stone removal  05/2003  . GASTRIC BYPASS  1984   "stapeling"  . HERNIA REPAIR  2004   "in my stomach; had OR on it twice", wire mesh on 1 hernia  . PERCUTANEOUS CORONARY STENT INTERVENTION (PCI-S) N/A 06/13/2013   Procedure: PERCUTANEOUS CORONARY STENT INTERVENTION (PCI-S);  Surgeon: [name], MD;  Location: [facility] CATH LAB;  Service: Cardiovascular;  Laterality: N/A;  . PERCUTANEOUS STENT INTERVENTION N/A 04/24/2013   Procedure: PERCUTANEOUS STENT INTERVENTION;  Surgeon: [name], MD;  Location: [facility] CATH LAB;  Service: Cardiovascular;  Laterality: N/A;  . ROOT CANAL  10/2000  . TIBIA FRACTURE SURGERY Right 1970's   rods and pins  . TOOTH EXTRACTION    . TOTAL ABDOMINAL HYSTERECTOMY  1970's   w/ appendectomy  . TUMOR REMOVAL  1970's   ovarian    Allergies  Allergen Reactions  . Amoxicillin Shortness Of  Breath and Rash  . Brilinta [Ticagrelor] Shortness Of Breath  . Erythromycin Shortness Of Breath, Other (See Comments) and Hives    Trouble swallowing  . Flagyl [Metronidazole] Palpitations and Shortness Of Breath  . Penicillins Shortness Of Breath, Rash and Hives  . Isosorbide Mononitrate [Isosorbide Nitrate] Other (See Comments)    Joint aches, muscles hurt, difficult to walk   . Jardiance [Empagliflozin] Other (See Comments)    Nausea, joint aches, muscles aches  . Metformin And Related Other (See Comments)    Stomach pain, cold sweats, joint pain, burred vision, dizziness  . Tape Other (See Comments)    Skin pulls off with certain types Plastic tape causes skin to rip if left on for long periods of time  . Erythromycin Base Rash    Current Outpatient Prescriptions  Medication Sig Dispense Refill  . acetaminophen (TYLENOL) 325 MG tablet Take 650 mg by mouth every 6 (six) hours as needed for mild pain.     Marland Kitchen albuterol (PROVENTIL) (2.5 MG/3ML) 0.083% nebulizer solution Take 2.5 mg by nebulization every 6 (six) hours as needed for shortness of breath (when asthma is active).     Marland Kitchen albuterol (PROVENTIL,VENTOLIN) 90 MCG/ACT inhaler Inhale 2 puffs into the lungs every 6 (six) hours as needed for shortness of breath.     . Alirocumab (PRALUENT) 150 MG/ML SOPN Inject 1 pen into the skin every 14 (fourteen) days. 6 pen 3  . ALPRAZolam (XANAX) 0.5 MG tablet Take 0.5-1 mg by mouth 3 (three) times daily. 0.5mg  in the morning and at lunch, 1mg  at bedtime    . amLODipine (NORVASC) 10 MG tablet Take 10 mg by mouth daily.    Marland Kitchen aspirin EC 81 MG tablet Take 162 mg by mouth daily.    Marland Kitchen b complex vitamins capsule Take 1 capsule by mouth daily.    Marland Kitchen buPROPion (WELLBUTRIN XL) 150 MG 24 hr tablet Take 1 tablet by mouth daily.    . Calcium Carbonate 1500 (600 CA) MG TABS Take 1,500 mg by mouth daily with breakfast.    . Cholecalciferol (VITAMIN D) 2000 UNITS tablet  Take 2,000 Units by mouth daily with  breakfast.     . cloNIDine (CATAPRES) 0.2 MG tablet Take 0.2 mg by mouth 2 (two) times daily.    . clopidogrel (PLAVIX) 75 MG tablet TAKE ONE (1) TABLET BY MOUTH EVERY DAY 90 tablet 3  . denosumab (PROLIA) 60 MG/ML SOLN injection Inject 60 mg into the skin every 6 (six) months. Administer in upper arm, thigh, or abdomen    . DULoxetine (CYMBALTA) 60 MG capsule Take 60 mg by mouth daily with breakfast.    . ezetimibe (ZETIA) 10 MG tablet Take 1 tablet (10 mg total) by mouth daily. 30 tablet 11  . Fluticasone-Salmeterol (ADVAIR DISKUS) 250-50 MCG/DOSE AEPB Inhale 1 puff into the lungs every 12 (twelve) hours.     . furosemide (LASIX) 40 MG tablet Take 40 mg by mouth.    . gabapentin (NEURONTIN) 600 MG tablet Take 600-750 mg by mouth 3 (three) times daily. 600mg  in the morning and at noon. 750mg  at bedtime.    Marland Kitchen HUMALOG KWIKPEN 100 UNIT/ML SOPN Inject 14-22 Units into the skin 4 (four) times daily -  with meals and at bedtime. 10 units at every meal and 14 units at bedtime if needed    . hydrALAZINE (APRESOLINE) 50 MG tablet Take 50 mg by mouth 3 (three) times daily as needed (for high blood pressure). If BP is >175    . HYDROcodone-acetaminophen (NORCO/VICODIN) 5-325 MG per tablet Take 1 tablet by mouth at bedtime as needed for moderate pain.     Marland Kitchen ipratropium-albuterol (DUONEB) 0.5-2.5 (3) MG/3ML SOLN Take 3 mLs by nebulization as needed (If severe asthma , use every 4 hours for 10 days).     Marland Kitchen LANTUS SOLOSTAR 100 UNIT/ML Solostar Pen Inject 46-52 Units into the skin every morning.     . loperamide (IMODIUM) 2 MG capsule Take 2 mg by mouth as needed for diarrhea or loose stools.    Marland Kitchen losartan (COZAAR) 100 MG tablet Take 100 mg by mouth daily with breakfast.     . Magnesium 400 MG TABS Take by mouth.    . magnesium oxide (MAG-OX) 400 MG tablet Take 400 mg by mouth 2 (two) times daily.    . meclizine (ANTIVERT) 12.5 MG tablet Take 12.5 mg by mouth 3 (three) times daily as needed for dizziness.    .  methocarbamol (ROBAXIN) 500 MG tablet Take 1 tablet (500 mg total) by mouth 3 (three) times daily. 60 tablet 1  . metoprolol succinate (TOPROL-XL) 25 MG 24 hr tablet TAKE ONE TABLET TWICE DAILY 180 tablet 3  . metoprolol tartrate (LOPRESSOR) 25 MG tablet Take 25 mg by mouth 2 (two) times daily.    . Multiple Vitamin (MULTIVITAMIN) capsule Take 1 capsule by mouth daily.    . Multiple Vitamins-Minerals (CENTRUM SILVER PO) Take 1 tablet by mouth daily.    . multivitamin-lutein (OCUVITE-LUTEIN) CAPS capsule Take 1 capsule by mouth daily.    . nitroGLYCERIN (NITROSTAT) 0.3 MG SL tablet Place 1 tablet (0.3 mg total) under the tongue every 5 (five) minutes x 3 doses as needed for chest pain. 90 tablet 1  . ondansetron (ZOFRAN) 4 MG tablet Take 1 tablet (4 mg total) by mouth 2 (two) times daily as needed. For nausea 20 tablet 0  . pantoprazole (PROTONIX) 40 MG tablet Take 1 tablet (40 mg total) by mouth 2 (two) times daily. 180 tablet 0  . PRALUENT 75 MG/ML SOPN as directed.    Marland Kitchen RANEXA 1000 MG  SR tablet TAKE ONE (1) TABLET BY MOUTH TWO (2) TIMES DAILY 60 each 3  . rosuvastatin (CRESTOR) 40 MG tablet TAKE ONE (1) TABLET EACH DAY 90 tablet 6  . torsemide (DEMADEX) 20 MG tablet Take 4 tablets (80 mg total) by mouth daily. 120 tablet 3  . triamterene-hydrochlorothiazide (MAXZIDE-25) 37.5-25 MG tablet Take 1 tablet by mouth daily.    . vitamin B-12 (CYANOCOBALAMIN) 1000 MCG tablet Take 1,000 mcg by mouth daily.    Marland Kitchen zinc gluconate 50 MG tablet Take 50 mg by mouth at bedtime.     Marland Kitchen zolpidem (AMBIEN) 10 MG tablet Take 10 mg by mouth at bedtime as needed for sleep.      No current facility-administered medications for this visit.     Review of Systems : See HPI for pertinent positives and negatives.  Physical Examination  Vitals:   05/17/16 1223 05/17/16 1226  BP: (!) 145/78 126/69  Pulse: 63   Resp: 20   Temp: 97.5 F (36.4 C)   TempSrc: Oral   SpO2: 91%   Weight: 297 lb (134.7 kg)   Height: 5'  1" (1.549 m)    Body mass index is 56.12 kg/m.  General: WDWN morbidly obese female in NAD GAIT: normal Eyes: PERRLA Pulmonary: Respirations are non-labored, CTAB but distant sounding Cardiac: regular rhythm, no detected murmur.  VASCULAR EXAM Carotid Bruits Right Left   positive positve   Radial pulses are 1+ palpable and equal.      LE Pulses Right Left   POPLITEAL not palpable  not palpable   POSTERIOR TIBIAL  not palpable  not palpable    DORSALIS PEDIS  ANTERIOR TIBIAL 2+ palpable  2+ palpable     Gastrointestinal: soft, mild tenderness to palpation at right mid abdomen, BS WNL, no r/g, no palpable masses, large panus.  Musculoskeletal: No muscle atrophy/wasting. M/S 4/5 throughout, Extremities without ischemic changes.  Neurologic: A&O X 3; Appropriate Affect, Speech is normal CN 2-12 intact, Pain and light touch intact in extremities, Motor exam as listed above    Assessment: [name] [name] is a 72 y.o. female  who is s/p left carotid stent placement on 03/20/2013 with distal embolic protection. This was done in the setting of a symptomatic left carotid stenosis. The patient was having nearly daily episodes of aphasia.  She has had several more strokes and TIA's. Pt states Dr. Jannifer Franklin is not sure of the etiology of her stokes, but states she has two brain aneurysms.  Her atherosclerotic risk factors include almost in control DM, morbid obesity, dyslipidemia, and CAD. Fortunately she has never used tobacco.   DATA Today's carotid Duplex suggests 1-39% right ICA stenosis. Left carotid artery stent with no stenosis. Bilateral vertebral artery flow is antegrade.  Bilateral subclavian artery waveforms are normal.  No significant change from 05-12-15 exam.     Plan: Follow-up in 1 year with Carotid Duplex scan.    I discussed in depth with the patient the nature of atherosclerosis, and emphasized the importance of maximal medical management including strict control of blood pressure, blood glucose, and lipid levels, obtaining regular exercise, and continued cessation of smoking.  The patient is aware that without maximal medical management the underlying atherosclerotic disease process will progress, limiting the benefit of any interventions. The patient was given information about stroke prevention and what symptoms should prompt the patient to seek immediate medical care. Thank you for allowing [place] to participate in this patient's care.  Vinnie Level Lido Maske, RN, MSN, FNP-C  [facility] Office: [facility] Physician: Trula Slade  05/17/16 12:50 PM

## 2016-05-18 ENCOUNTER — Ambulatory Visit (HOSPITAL_BASED_OUTPATIENT_CLINIC_OR_DEPARTMENT_OTHER): Payer: Medicare Other | Attending: Adult Health | Admitting: Cardiology

## 2016-05-18 VITALS — Ht 61.0 in | Wt 297.0 lb

## 2016-05-18 DIAGNOSIS — I493 Ventricular premature depolarization: Secondary | ICD-10-CM | POA: Insufficient documentation

## 2016-05-18 DIAGNOSIS — R0902 Hypoxemia: Secondary | ICD-10-CM | POA: Diagnosis not present

## 2016-05-18 DIAGNOSIS — R008 Other abnormalities of heart beat: Secondary | ICD-10-CM | POA: Insufficient documentation

## 2016-05-18 DIAGNOSIS — R5383 Other fatigue: Secondary | ICD-10-CM

## 2016-05-18 DIAGNOSIS — G4734 Idiopathic sleep related nonobstructive alveolar hypoventilation: Secondary | ICD-10-CM | POA: Diagnosis not present

## 2016-05-18 NOTE — Addendum Note (Signed)
Addended by: Lianne Cure A on: 05/18/2016 09:21 AM   Modules accepted: Orders

## 2016-05-20 ENCOUNTER — Inpatient Hospital Stay: Admission: RE | Admit: 2016-05-20 | Payer: Medicare Other | Source: Ambulatory Visit

## 2016-05-21 ENCOUNTER — Other Ambulatory Visit: Payer: Self-pay | Admitting: Neurology

## 2016-05-21 ENCOUNTER — Encounter (HOSPITAL_COMMUNITY): Payer: Self-pay | Admitting: Family Medicine

## 2016-05-21 NOTE — Progress Notes (Signed)
Mailed letter w/CRP to pt.Marland Kitchen KJ

## 2016-05-24 NOTE — Procedures (Signed)
   Patient Name: [name] [name], [name] [name] Date: 05/18/2016 Gender: Female D.O.B: 04-Apr-1945 Age (years): 3 Referring Provider: Darrick Grinder NP Height (inches): 61 Interpreting Physician: Fransico Him MD, ABSM Weight (lbs): 297 RPSGT: Zadie Rhine BMI: 56 [name]: [id number] Neck Size: 15.50  CLINICAL INFORMATION Sleep Study Type: NPSG  Indication for sleep study: Fatigue  Epworth Sleepiness Score: 6  SLEEP STUDY TECHNIQUE As per the AASM Manual for the Scoring of Sleep and Associated Events v2.3 (April 2016) with a hypopnea requiring 4% desaturations.  The channels recorded and monitored were frontal, central and occipital EEG, electrooculogram (EOG), submentalis EMG (chin), nasal and oral airflow, thoracic and abdominal wall motion, anterior tibialis EMG, snore microphone, electrocardiogram, and pulse oximetry.  MEDICATIONS Medications self-administered by patient taken the night of the study : AMBIEN, CLONIDINE HCL, GABAPENTIN, HYDROCODONE/APAP, METHOCARBAMOL, TER-TOPROL XL, PLAVIX, PROTONIX, RANEXA, XANAX, ZOFRAN  SLEEP ARCHITECTURE The study was initiated at 10:31:30 PM and ended at 4:37:33 AM.  Sleep onset time was 58.5 minutes and the sleep efficiency was 66.7%. The total sleep time was 244.0 minutes.  Stage REM latency was 181.0 minutes.  The patient spent 3.28% of the night in stage N1 sleep, 92.42% in stage N2 sleep, 0.82% in stage N3 and 3.48% in REM.  Alpha intrusion was absent.  Supine sleep was 100.00%.  RESPIRATORY PARAMETERS The overall apnea/hypopnea index (AHI) was 1.0 per hour. There were 4 total apneas, including 4 obstructive, 0 central and 0 mixed apneas. There were 0 hypopneas and 5 RERAs.  The AHI during Stage REM sleep was 21.2 per hour.  AHI while supine was 1.0 per hour.  The mean oxygen saturation was 92.90%. The minimum SpO2 during sleep was 81.00%.  Soft snoring was noted during this study.  CARDIAC DATA The 2 lead EKG demonstrated sinus  rhythm. The mean heart rate was 60.38 beats per minute. Other EKG findings include: PVCs and bigeminal PVCs.  LEG MOVEMENT DATA The total PLMS were 0 with a resulting PLMS index of 0.00. Associated arousal with leg movement index was 0.0 .  IMPRESSIONS - No significant obstructive sleep apnea occurred during this study (AHI = 1.0/h). - No significant central sleep apnea occurred during this study (CAI = 0.0/h). - Mild oxygen desaturation was noted during this study (Min O2 = 81.00%). - The patient snored with Soft snoring volume. - PVCs and bigeminal PVCs were noted during this study. - Clinically significant periodic limb movements did not occur during sleep. No significant associated arousals.  DIAGNOSIS - Nocturnal Hypoxemia (327.26 [G47.36 ICD-10]) - PVCs and bigeminal PVCs  RECOMMENDATIONS - Avoid alcohol, sedatives and other CNS depressants that may worsen sleep apnea and disrupt normal sleep architecture. - Sleep hygiene should be reviewed to assess factors that may improve sleep quality. - Weight management and regular exercise should be initiated or continued if appropriate. - Patient ad nocturnal hypoxemia during study. Recommend Oxygen at 2L for sleep and repeat pulse ox on Oxygen.  [place], American Board of Sleep Medicine  ELECTRONICALLY SIGNED ON:  05/24/2016, 9:07 AM [place] PH: [phone]   FX: [phone] [place]

## 2016-05-25 ENCOUNTER — Ambulatory Visit (HOSPITAL_COMMUNITY)
Admission: RE | Admit: 2016-05-25 | Discharge: 2016-05-25 | Disposition: A | Discharge status: Home / Self Care | Payer: Medicare Other | Source: Ambulatory Visit | Attending: Cardiology | Admitting: Cardiology

## 2016-05-25 VITALS — BP 120/74 | HR 64 | Wt 301.6 lb

## 2016-05-25 DIAGNOSIS — I5032 Chronic diastolic (congestive) heart failure: Secondary | ICD-10-CM | POA: Diagnosis not present

## 2016-05-25 DIAGNOSIS — I2510 Atherosclerotic heart disease of native coronary artery without angina pectoris: Secondary | ICD-10-CM | POA: Insufficient documentation

## 2016-05-25 DIAGNOSIS — R002 Palpitations: Secondary | ICD-10-CM | POA: Diagnosis not present

## 2016-05-25 DIAGNOSIS — E1143 Type 2 diabetes mellitus with diabetic autonomic (poly)neuropathy: Secondary | ICD-10-CM | POA: Diagnosis not present

## 2016-05-25 DIAGNOSIS — E1122 Type 2 diabetes mellitus with diabetic chronic kidney disease: Secondary | ICD-10-CM | POA: Diagnosis not present

## 2016-05-25 DIAGNOSIS — I350 Nonrheumatic aortic (valve) stenosis: Secondary | ICD-10-CM | POA: Insufficient documentation

## 2016-05-25 DIAGNOSIS — K449 Diaphragmatic hernia without obstruction or gangrene: Secondary | ICD-10-CM | POA: Insufficient documentation

## 2016-05-25 DIAGNOSIS — E7849 Other hyperlipidemia: Secondary | ICD-10-CM

## 2016-05-25 DIAGNOSIS — Z8249 Family history of ischemic heart disease and other diseases of the circulatory system: Secondary | ICD-10-CM | POA: Insufficient documentation

## 2016-05-25 DIAGNOSIS — I25111 Atherosclerotic heart disease of native coronary artery with angina pectoris with documented spasm: Secondary | ICD-10-CM | POA: Diagnosis not present

## 2016-05-25 DIAGNOSIS — N189 Chronic kidney disease, unspecified: Secondary | ICD-10-CM | POA: Insufficient documentation

## 2016-05-25 DIAGNOSIS — Z951 Presence of aortocoronary bypass graft: Secondary | ICD-10-CM | POA: Insufficient documentation

## 2016-05-25 DIAGNOSIS — E784 Other hyperlipidemia: Secondary | ICD-10-CM

## 2016-05-25 DIAGNOSIS — Z79899 Other long term (current) drug therapy: Secondary | ICD-10-CM | POA: Insufficient documentation

## 2016-05-25 DIAGNOSIS — K219 Gastro-esophageal reflux disease without esophagitis: Secondary | ICD-10-CM | POA: Diagnosis not present

## 2016-05-25 DIAGNOSIS — Z85820 Personal history of malignant melanoma of skin: Secondary | ICD-10-CM | POA: Diagnosis not present

## 2016-05-25 DIAGNOSIS — Z7902 Long term (current) use of antithrombotics/antiplatelets: Secondary | ICD-10-CM | POA: Diagnosis not present

## 2016-05-25 DIAGNOSIS — R0602 Shortness of breath: Secondary | ICD-10-CM | POA: Diagnosis not present

## 2016-05-25 DIAGNOSIS — Z8673 Personal history of transient ischemic attack (TIA), and cerebral infarction without residual deficits: Secondary | ICD-10-CM | POA: Insufficient documentation

## 2016-05-25 DIAGNOSIS — Z9889 Other specified postprocedural states: Secondary | ICD-10-CM | POA: Diagnosis not present

## 2016-05-25 DIAGNOSIS — G4733 Obstructive sleep apnea (adult) (pediatric): Secondary | ICD-10-CM | POA: Diagnosis not present

## 2016-05-25 DIAGNOSIS — F419 Anxiety disorder, unspecified: Secondary | ICD-10-CM | POA: Diagnosis not present

## 2016-05-25 DIAGNOSIS — E785 Hyperlipidemia, unspecified: Secondary | ICD-10-CM | POA: Diagnosis not present

## 2016-05-25 DIAGNOSIS — I130 Hypertensive heart and chronic kidney disease with heart failure and stage 1 through stage 4 chronic kidney disease, or unspecified chronic kidney disease: Secondary | ICD-10-CM | POA: Insufficient documentation

## 2016-05-25 DIAGNOSIS — Z7982 Long term (current) use of aspirin: Secondary | ICD-10-CM | POA: Insufficient documentation

## 2016-05-25 DIAGNOSIS — G4730 Sleep apnea, unspecified: Secondary | ICD-10-CM | POA: Diagnosis not present

## 2016-05-25 DIAGNOSIS — Z955 Presence of coronary angioplasty implant and graft: Secondary | ICD-10-CM | POA: Diagnosis not present

## 2016-05-25 DIAGNOSIS — Z794 Long term (current) use of insulin: Secondary | ICD-10-CM | POA: Insufficient documentation

## 2016-05-25 DIAGNOSIS — I10 Essential (primary) hypertension: Secondary | ICD-10-CM | POA: Diagnosis not present

## 2016-05-25 DIAGNOSIS — R42 Dizziness and giddiness: Secondary | ICD-10-CM | POA: Diagnosis not present

## 2016-05-25 LAB — BASIC METABOLIC PANEL
Anion gap: 7 (ref 5–15)
BUN: 20 mg/dL (ref 6–20)
CO2: 30 mmol/L (ref 22–32)
Calcium: 9.1 mg/dL (ref 8.9–10.3)
Chloride: 103 mmol/L (ref 101–111)
Creatinine, Ser: 1.04 mg/dL — ABNORMAL HIGH (ref 0.44–1.00)
GFR calc Af Amer: >60 mL/min (ref >60)
GFR calc non Af Amer: 54 mL/min — ABNORMAL LOW (ref >60)
Glucose, Bld: 192 mg/dL — ABNORMAL HIGH (ref 65–99)
Potassium: 5.7 mmol/L — ABNORMAL HIGH (ref 3.5–5.1)
Sodium: 140 mmol/L (ref 135–145)

## 2016-05-25 NOTE — Progress Notes (Signed)
Patient ID: [name] [name], female   DOB: 12-Dec-1945, 71 y.o.   [name]: [id number] PCP: Dr. Sandi Mariscal Cardiology: Dr. [name]  71 yo with history of CAD s/p CABG, diastolic CHF, and cerebrovascular disease with history of CVA presents for cardiology followup. She had CABG x 5 in 12/11.  Prior to the CABG she had multiple PCIs.  She had a CVA in 2/14 that presented as visual loss.  She had a left carotid stent in 2/15.   Left heart cath done in Sept. 2014. This showed patent SVG-D, LIMA-LAD, and sequential SVG-OM branches but SVG-RCA and the native RCA were both totally occluded. It was felt aht her increased symptoms coincided with occlusion of SVG-RCA.  She was started on Imdur to see if this would help with the chest pain and dyspnea.  However, she feels like Imdur causes leg cramps and does not think that she can take it.  So ranolazine 500 mg bid started and titrated this up to 1000 mg bid.  This helped some but not markedly.  Therefore, sent to  Dr. Irish Lack to address opening RCA CTO. He was able to do this in 5/15 with 4 overlapping Xience DES.  This led to resolution of exertional chest pain.    [name] [name] was started on Brilinta after PCI.  She became much more short of breath after starting on Brilinta. Per Dr Delano Metz stopped and replaced with Plavix.  Dyspnea improved off Brilinta. Given increasing exertional dyspnea and chest pain,  RHC/LHC in 4/17.  This showed stable CAD with no interventional target.  Left and right heart filling pressures were not significantly increased.  Medical management.   She returns today for HF follow up. She is feeling weak and fatigued today. Complaining of pain in her right lower back, it is tender to touch. Denies fever and chills. Denies chest pain. She has an appointment with her PCP tomorrow. Her breathing is worse, feels SOB with walking from the waiting room back to clinic. SOB going up stairs. Also feels SOB with brushing her teeth and dressing  herself. She complains of extreme fatigue. She can barely get out of bed most days. Taking all medications with compliance. Endorses orthopnea. Drinking less than 2L a day. Eating a low sodium diet. Not weighing herself at home due to broken scale.   Labs (6/14): K 3.8, creatinine 0.71 Labs (8/14): BNP 249, LDL 166 Labs (9/14): K 4.7, creatinine 0.7 Labs (9/14): K 4.6, creatinine 0.9, BNP 109 Labs (1/15): K 4.5, creatinine 0.73, LDL 212, HCT 43.4, TSH normal, BNP 138 Labs (6/15): K 4.7, creatinine 1.17, LDL 100, HDL 37, TGs 363, BNP 39 Labs (10/15): K 4.6, creatinine 1.2 Labs (1/16): LDL 157, HDL 43, K 5.2, creatinine 0.95 Labs (2/16): K 4.5, creatinine 1.08, BNP 113 Labs (4/16): LDL 50, HDL 56, TGs 179 Labs (9/16): K 5.3, creatinine 1.09 Labs (10/16): LDL 75, HDL 56 Labs (2/17): K 5.2, creatinine 1.07 Labs (4/17): K 5.1, creatinine 1.07 Labs (5/17): K 4.5, creatinine 1.01, LDL 96, HDL 56, BNP 70 Labs (03/2016) K 4.9, creatinine 1.07     PMH: 1. Diabetic gastroparesis 2. Type II diabetes 3. HTN 4. Morbid obesity 5. CAD: s/p CABG in 12/11 after prior PCIs.  LIMA-LAD, SVG-D, seq SVG-OM1 and OM2, SVG-PDA. Adenosine Cardiolite (8/14) with EF 53% and a small reversible apical defect with a medium, partially reversible inferior defect.  LHC (9/14) with patent SVG-D, LIMA-LAD (50% distal LAD), and sequential SVG-OM branches;  the SVG-RCA and the native RCA were occluded.  This was managed medically initially, but with ongoing exertional chest pain, it was decided to open CTO.  Patient had CTO opening with 4 overlapping Xience DES in the RCA in 5/15.   - LHC (4/17): SVG-D patent, LIMA-LAD patent, distal LAD with several 40-50% stenoses, sequential SVG-OM1 and PLOM patent with 50% proximal stenosis (not flow limiting), SVG-RCA TO with patent RCA stents.  6. Atypical migraines 7. OSA: Intolerant of CPAP.  8. GERD with hiatal hernia 9. OA 10. Diastolic CHF: Echo (63/87) with EF 50-55%, grade II  diastolic dysfunction, mild-moderate MR.  Echo (8/14) with EF 55-60%, grade II diastolic dysfunction, mildly increased aortic valve gradient (mean 12 mmHg) but valve opens well, mild MR and mild RV dilation.  Echo (9/15) with EF 60-65%, grade II diastolic dysfunction, mild aortic stenosis, mild mitral stenosis, mild to moderate mitral regurgitation, mildly dilated RV with normal systolic function, PA systolic pressure 46 mmHg.  - RHC (4/17): mean RA 9, PA 38/15 mean 27, mean PCWP 9, CI 2.5.  - Echo (5/17): EF 55-60%, mild LVH, mildly dilated RV with low normal systolic function, moderate TR, PASP 61 mmHg 11. CKD 12. Chronic LBBB 13. Anxiety 14. Carotid stenosis: Followed by [facility], >56% LICA stenosis 43/32.  She had left carotid stent in 2/15. Carotid dopplers 8/15 with no significant disease. Carotid dopplers (4/16): < 40% RICA stenosis.  15. Cerebrovascular disease: CVA 2/14 with right posterior cerebral artery territory ischemic infarction. Cerebral angiogram in 6/14 showed 70% right vertebral ostial stenosis, 95-18% LICA stenosis, > 84% proximal left posterior cerebral artery stenosis, posterior communicating artery aneurysm.  Patient has had episodes of transient expressive aphasia.  Carotid dopplers (16/60) showed >63% LICA stenosis. She had a left carotid stent 2/15.  Possible CVA in 7/15.  16. Positional vertigo (suspected) 17. Palpitations: Holter (6/15) with rare PVCs/PACs.  18. Dyspnea with Brilinta. 19. Melanoma: On face, s/p excision.   20. Aortic stenosis: Mild.  21. Holter (8/16) with no significant arrhythmias.  22. Lower extremity arterial dopplers (9/16) were normal.   SH: Married, nonsmoker  FH: CAD  ROS: All systems reviewed and negative except as per HPI.   Current Outpatient Prescriptions  Medication Sig Dispense Refill  . acetaminophen (TYLENOL) 325 MG tablet Take 650 mg by mouth every 6 (six) hours as needed for mild pain.     Marland Kitchen albuterol (PROVENTIL) (2.5 MG/3ML) 0.083%  nebulizer solution Take 2.5 mg by nebulization every 6 (six) hours as needed for shortness of breath (when asthma is active).     Marland Kitchen albuterol (PROVENTIL,VENTOLIN) 90 MCG/ACT inhaler Inhale 2 puffs into the lungs every 6 (six) hours as needed for shortness of breath.     . Alirocumab (PRALUENT) 150 MG/ML SOPN Inject 1 pen into the skin every 14 (fourteen) days. 6 pen 3  . ALPRAZolam (XANAX) 0.5 MG tablet Take 0.5-1 mg by mouth 3 (three) times daily. 0.5mg  in the morning and at lunch, 1mg  at bedtime    . amLODipine (NORVASC) 10 MG tablet Take 10 mg by mouth daily.    Marland Kitchen aspirin EC 81 MG tablet Take 162 mg by mouth daily.    Marland Kitchen b complex vitamins capsule Take 1 capsule by mouth daily.    Marland Kitchen buPROPion (WELLBUTRIN XL) 150 MG 24 hr tablet Take 1 tablet by mouth daily.    . Calcium Carbonate 1500 (600 CA) MG TABS Take 1,500 mg by mouth daily with breakfast.    . Cholecalciferol (  VITAMIN D) 2000 UNITS tablet Take 2,000 Units by mouth daily with breakfast.     . cloNIDine (CATAPRES) 0.2 MG tablet Take 0.2 mg by mouth 2 (two) times daily.    . clopidogrel (PLAVIX) 75 MG tablet TAKE ONE (1) TABLET BY MOUTH EVERY DAY 90 tablet 3  . denosumab (PROLIA) 60 MG/ML SOLN injection Inject 60 mg into the skin every 6 (six) months. Administer in upper arm, thigh, or abdomen    . DULoxetine (CYMBALTA) 60 MG capsule Take 60 mg by mouth daily with breakfast.    . ezetimibe (ZETIA) 10 MG tablet Take 1 tablet (10 mg total) by mouth daily. 30 tablet 11  . Fluticasone-Salmeterol (ADVAIR DISKUS) 250-50 MCG/DOSE AEPB Inhale 1 puff into the lungs every 12 (twelve) hours.     . gabapentin (NEURONTIN) 600 MG tablet Take 600-750 mg by mouth 3 (three) times daily. 600mg  in the morning and at noon. 750mg  at bedtime.    Marland Kitchen HUMALOG KWIKPEN 100 UNIT/ML SOPN Inject 14-22 Units into the skin 4 (four) times daily -  with meals and at bedtime. 10 units at every meal and 14 units at bedtime if needed    . HYDROcodone-acetaminophen (NORCO/VICODIN)  5-325 MG per tablet Take 1 tablet by mouth at bedtime as needed for moderate pain.     Marland Kitchen ipratropium-albuterol (DUONEB) 0.5-2.5 (3) MG/3ML SOLN Take 3 mLs by nebulization as needed (If severe asthma , use every 4 hours for 10 days).     Marland Kitchen LANTUS SOLOSTAR 100 UNIT/ML Solostar Pen Inject 46-52 Units into the skin every morning.     . loperamide (IMODIUM) 2 MG capsule Take 2 mg by mouth as needed for diarrhea or loose stools.    Marland Kitchen losartan (COZAAR) 100 MG tablet Take 100 mg by mouth daily with breakfast.     . Magnesium 400 MG TABS Take by mouth.    . magnesium oxide (MAG-OX) 400 MG tablet Take 400 mg by mouth 2 (two) times daily.    . meclizine (ANTIVERT) 12.5 MG tablet Take 12.5 mg by mouth 3 (three) times daily as needed for dizziness.    . methocarbamol (ROBAXIN) 500 MG tablet TAKE ONE (1) TABLET THREE (3) TIMES EACH DAY 60 tablet 1  . metoprolol succinate (TOPROL-XL) 25 MG 24 hr tablet TAKE ONE TABLET TWICE DAILY 180 tablet 3  . Multiple Vitamin (MULTIVITAMIN) capsule Take 1 capsule by mouth daily.    . Multiple Vitamins-Minerals (CENTRUM SILVER PO) Take 1 tablet by mouth daily.    . multivitamin-lutein (OCUVITE-LUTEIN) CAPS capsule Take 1 capsule by mouth daily.    . nitroGLYCERIN (NITROSTAT) 0.3 MG SL tablet Place 1 tablet (0.3 mg total) under the tongue every 5 (five) minutes x 3 doses as needed for chest pain. 90 tablet 1  . ondansetron (ZOFRAN) 4 MG tablet Take 1 tablet (4 mg total) by mouth 2 (two) times daily as needed. For nausea 20 tablet 0  . pantoprazole (PROTONIX) 40 MG tablet Take 1 tablet (40 mg total) by mouth 2 (two) times daily. 180 tablet 0  . PRALUENT 75 MG/ML SOPN as directed.    Marland Kitchen RANEXA 1000 MG SR tablet TAKE ONE (1) TABLET BY MOUTH TWO (2) TIMES DAILY 60 each 3  . rosuvastatin (CRESTOR) 40 MG tablet TAKE ONE (1) TABLET EACH DAY 90 tablet 6  . torsemide (DEMADEX) 20 MG tablet Take 4 tablets (80 mg total) by mouth daily. 120 tablet 3  . triamterene-hydrochlorothiazide  (MAXZIDE-25) 37.5-25 MG tablet Take 1  tablet by mouth daily.    . vitamin B-12 (CYANOCOBALAMIN) 1000 MCG tablet Take 1,000 mcg by mouth daily.    Marland Kitchen zinc gluconate 50 MG tablet Take 50 mg by mouth at bedtime.     Marland Kitchen zolpidem (AMBIEN) 10 MG tablet Take 10 mg by mouth at bedtime as needed for sleep.     . hydrALAZINE (APRESOLINE) 50 MG tablet Take 50 mg by mouth 3 (three) times daily as needed (for high blood pressure). If BP is >175     No current facility-administered medications for this encounter.     BP 120/74 (BP Location: Left Wrist, Patient Position: Sitting, Cuff Size: Normal)   Pulse 64   Wt (!) 301 lb 9.6 oz (136.8 kg)   SpO2 98%   BMI 56.99 kg/m  PHYSICAL EXAM General: Obese female, SOB with walking into clinic  HEENT: normal Neck: supple. JVP 8cm. Carotids 2+ bilat; no bruits. No thyromegaly or nodule noted. Cor: PMI nondisplaced. RRR, No M/G/R noted Lungs: Bilateral upper wheezing, diminished in bilateral lower lobes.  Abdomen: soft, non-tender, distended, no HSM. No bruits or masses. + bowel sounds.  Extremities: no cyanosis, clubbing, rash, warm. 1-2+ pedal edema.  Neuro: alert & orientedx3, cranial nerves grossly intact. moves all 4 extremities w/o difficulty. Affect pleasant   Assessment/Plan: 1. CAD: Occluded native RCA and SVG-RCA.  Now status post opening of CTO RCA with 4 overlapping Xience DES.  Most recent cath in 4/17 showed patent RCA stents, no interventional target.  - Continue ASA + Plavix.    - Has not tolerated Imdur in the past.  - She will start cardiac rehab in May.  2. Chronic diastolic CHF:   - SOB with minimal exertion. Volume overloaded on exam with 1+ ankle edema. Will have her take an extra 40mg  torsemide today and tomorrow.  - Check BMET today.  Do the following things EVERYDAY: 1) Weigh yourself in the morning before breakfast. Write it down and keep it in a log. 2) Take your medicines as prescribed 3) Eat low salt foods-Limit salt (sodium)  to 2000 mg per day.  4) Stay as active as you can everyday 5) Limit all fluids for the day to less than 2 liters 3. Hyperlipidemia: - Followed at the lipid clinic - On Praluent - LDL 97.  4. Hypertension:  - Well controlled on current regimen.     5. Cerebrovascular disease: She has history of CVA and had left carotid stent in 2/15. Followed by [facility]  6. Palpitations: Denies palpitations today.  - Continue Toprol XL   7. Suspected sleep apnea. - Sleep study done 05/2016 - No significant OSA, no central sleep apnea.  - She will follow up with Dr. Theodosia Blender office. It was recommended that she wear 2L O2 at night.   Follow up in 6 weeks.    Arbutus Leas NP-C  05/25/2016

## 2016-05-25 NOTE — Patient Instructions (Signed)
Take an additional 40 mg of Torsemide in the PM for the next two days (4/24 & 4/25 )   Labs today We will only contact you if something comes back abnormal or we need to make some changes. Otherwise no news is good news!   Your physician recommends that you schedule a follow-up appointment in: 6 weeks with [name] Booze, NP  Do the following things EVERYDAY: 1) Weigh yourself in the morning before breakfast. Write it down and keep it in a log. 2) Take your medicines as prescribed 3) Eat low salt foods-Limit salt (sodium) to 2000 mg per day.  4) Stay as active as you can everyday 5) Limit all fluids for the day to less than 2 liters

## 2016-05-26 ENCOUNTER — Telehealth: Payer: Self-pay | Admitting: *Deleted

## 2016-05-26 DIAGNOSIS — R0902 Hypoxemia: Secondary | ICD-10-CM

## 2016-05-26 DIAGNOSIS — G4734 Idiopathic sleep related nonobstructive alveolar hypoventilation: Secondary | ICD-10-CM

## 2016-05-26 NOTE — Telephone Encounter (Signed)
Called patient with results, LMTCB

## 2016-05-26 NOTE — Telephone Encounter (Signed)
-----   Message from Sueanne Margarita, MD sent at 05/24/2016  9:12 AM EDT ----- Patient has no evicence of OSA but does have nocturnal hypoxemia and daytime hypoxemia.  Please start on O2 at 2L [place] at nighttime during sleep and set up appt with Dr. Chase Caller with Pulmonary

## 2016-05-26 NOTE — Telephone Encounter (Signed)
Patient called back and was informed of results and recommendations and she verbalized understanding.  Patient understands an order will be sent to [facility] to start on 02 at 2L [place] during sleep. Patient understands she will be set up with an appointment with Dr. Chase Caller in Pulmonary. Patient agrees with the treatment plan and thanked me for the call.  Pulmonary referral placed.

## 2016-05-27 ENCOUNTER — Telehealth (HOSPITAL_COMMUNITY): Payer: Self-pay | Admitting: Family Medicine

## 2016-05-27 NOTE — Telephone Encounter (Signed)
-----   Message from Arbutus Leas, NP sent at 05/25/2016  3:29 PM EDT ----- Please have [name] [name] come back on Friday to recheck BMET. Please tell her not to take any potassium. Thank you.

## 2016-05-27 NOTE — Telephone Encounter (Signed)
Patient aware. Patient voiced understanding repeat labs 4/30. Orders placed

## 2016-05-27 NOTE — Telephone Encounter (Signed)
Patient has an appointment with Dr.Alva on 06/02/16.

## 2016-05-31 ENCOUNTER — Telehealth (HOSPITAL_COMMUNITY): Payer: Self-pay | Admitting: Cardiology

## 2016-05-31 ENCOUNTER — Ambulatory Visit (HOSPITAL_COMMUNITY)
Admission: RE | Admit: 2016-05-31 | Discharge: 2016-05-31 | Disposition: A | Discharge status: Home / Self Care | Payer: Medicare Other | Source: Ambulatory Visit | Attending: Cardiology | Admitting: Cardiology

## 2016-05-31 DIAGNOSIS — R0902 Hypoxemia: Secondary | ICD-10-CM | POA: Diagnosis not present

## 2016-05-31 DIAGNOSIS — G4734 Idiopathic sleep related nonobstructive alveolar hypoventilation: Secondary | ICD-10-CM | POA: Diagnosis present

## 2016-05-31 LAB — BASIC METABOLIC PANEL
Anion gap: 6 (ref 5–15)
BUN: 19 mg/dL (ref 6–20)
CO2: 29 mmol/L (ref 22–32)
Calcium: 8.7 mg/dL — ABNORMAL LOW (ref 8.9–10.3)
Chloride: 106 mmol/L (ref 101–111)
Creatinine, Ser: 1.11 mg/dL — ABNORMAL HIGH (ref 0.44–1.00)
GFR calc Af Amer: 58 mL/min — ABNORMAL LOW (ref >60)
GFR calc non Af Amer: 50 mL/min — ABNORMAL LOW (ref >60)
Glucose, Bld: 159 mg/dL — ABNORMAL HIGH (ref 65–99)
Potassium: 4.3 mmol/L (ref 3.5–5.1)
Sodium: 141 mmol/L (ref 135–145)

## 2016-05-31 NOTE — Telephone Encounter (Signed)
Called and left a message for Ms. Pomerleau. She did not show up for her lab appointment this am, need to check a BMET, last K was 5.7 and she was instructed to hold her potassium until repeat labs. I asked that she call me or the CHF clinic back to make an appointment for labs today as it is imperative we check her electrolytes.

## 2016-06-01 ENCOUNTER — Telehealth (HOSPITAL_COMMUNITY): Payer: Self-pay | Admitting: Cardiology

## 2016-06-01 NOTE — Telephone Encounter (Signed)
Called and left a message telling [name] [name] to remain off her potassium. She does not need to take any potassium. I asked her to call our office if she has any questions.

## 2016-06-02 ENCOUNTER — Encounter: Payer: Self-pay | Admitting: Pulmonary Disease

## 2016-06-02 ENCOUNTER — Ambulatory Visit (INDEPENDENT_AMBULATORY_CARE_PROVIDER_SITE_OTHER): Payer: Medicare Other | Admitting: Pulmonary Disease

## 2016-06-02 ENCOUNTER — Ambulatory Visit
Admission: RE | Admit: 2016-06-02 | Discharge: 2016-06-02 | Disposition: A | Discharge status: Home / Self Care | Payer: Medicare Other | Source: Ambulatory Visit | Attending: Neurology | Admitting: Neurology

## 2016-06-02 VITALS — BP 118/70 | HR 61 | Ht 61.75 in | Wt 297.0 lb

## 2016-06-02 DIAGNOSIS — J4530 Mild persistent asthma, uncomplicated: Secondary | ICD-10-CM | POA: Diagnosis not present

## 2016-06-02 DIAGNOSIS — G4734 Idiopathic sleep related nonobstructive alveolar hypoventilation: Secondary | ICD-10-CM | POA: Insufficient documentation

## 2016-06-02 DIAGNOSIS — G4733 Obstructive sleep apnea (adult) (pediatric): Secondary | ICD-10-CM | POA: Diagnosis not present

## 2016-06-02 DIAGNOSIS — J45909 Unspecified asthma, uncomplicated: Secondary | ICD-10-CM | POA: Diagnosis not present

## 2016-06-02 DIAGNOSIS — I208 Other forms of angina pectoris: Secondary | ICD-10-CM

## 2016-06-02 DIAGNOSIS — I671 Cerebral aneurysm, nonruptured: Secondary | ICD-10-CM

## 2016-06-02 NOTE — Assessment & Plan Note (Signed)
Surprisingly repeat sleep study does not show obstructive sleep apnea. Study seems to have been adequate with supine sleep although REM sleep was minimal.

## 2016-06-02 NOTE — Assessment & Plan Note (Signed)
She did have significant nocturnal hypoxia that was corrected with oxygen- this seems to be more related to underlying cardiac pulmonary disease since she is a never smoker and did not feel that she has any degree of COPD- She does not seem to be in overt CHF and did not have symptoms on the night of the sleep study. What we are left with then is pulmonary hypertension-this was noted on the echo however not verified on the right heart cath I have asked her to continue using oxygen during sleep, we will repeat nocturnal oximetry in 3 months to see if she needs this long-term

## 2016-06-02 NOTE — Patient Instructions (Signed)
Continue using 2 L oxygen during sleep We will recheck nocturnal oximetry in 3 months prior to next visit Asthma appears to be controlled on Advair

## 2016-06-02 NOTE — Addendum Note (Signed)
Addended by: Valerie Salts on: 06/02/2016 10:02 AM   Modules accepted: Orders

## 2016-06-02 NOTE — Progress Notes (Signed)
Subjective:    Patient ID: [name] [name], female    DOB: [birth year], 71 y.o.   [name]: [id number]  HPI 72 year old never smoker referred for evaluation of nocturnal hypoxia. She has ischemic cardiomyopathy and chronic diastolic heart failure and is seen by the CHF service. She had a sleep study done in 2006 which suggested moderate OSA. She is very claustrophobic and could not tolerate CPAP mask,  Did not want oral appliance, had seen my partner Dr. Gwenette Greet back then. She underwent another in [place] 05/18/16 which surprisingly did not show OSA. She slept supine and REM sleep was minimal. She did have severe desaturations to 81% and was placed on 2 L of oxygen for the remainder of the study. She was started on nocturnal oxygen and had an oxygen concentrator delivered which she has been u (devesing for the past few days. She  Reports asthma starting in her 74 and was severe and persistent nature requiring hospitalizations back then. Over the years she has settled down with Advair and only has 1-2 exacerbations during the year, her triggers include pine trees, cats, horses and an outdoor, no seasonal variations. She uses albuterol nebs about twice a week and uses her albuterol MDI more often for rescue. She is a lifetime never smoker.  She underwent CABG in 2011 and had graft stenosis requiring stent placements in 2017. She reports orthopnea and sleeps sitting up, she reports NYHA class 2-3 symptoms and dextrose my daily. She had a CVA in 03/2012 with visual loss and had a left carotid stent placed in 2015 (deveshwar)    Significant tests/ events NPSG 2006:  AHI 19/hr.   Echo 06/2015- EF 55-60%, RVSP 61 mm Right heart cath 05/2015 PCWP 9    Past Medical History:  Diagnosis Date  . Abnormality of gait 05/14/2014  . Anemia    hx  . Anginal pain ([place])   . Anxiety   . Asthma   . Bundle branch block, left    chronic/notes 07/18/2013  . Cancer [facility]) April 2016   BCC,  Melanoma  . CHF (congestive heart  failure) ([place])   . Chronic bronchitis ([place])    "off and on all the time" (07/18/2013)  . Chronic insomnia 05/06/2015  . Chronic kidney disease    frequency, sses dr Jamal Maes every 4 to 6 months  . Chronic lower back pain   . Claustrophobia   . Common migraine 05/14/2014  . Coronary artery disease   . Dysrhythmia   . GERD (gastroesophageal reflux disease)   . H/O hiatal hernia   . Headache    "probably 4 days/wk" (07/18/2013)  . Heart murmur   . Hyperlipidemia   . Hypertension   . Irregular heart beat   . Leg cramps    both legs at times  . Memory change 05/14/2014  . Mental disorder    hx of confusion  . Migraine    "used to have them really bad; kind of eased up now; down to 1 q couple months" (07/18/2013)  . Myocardial infarction ([place])    04/1999, 02/2000, 01/2005; 2011; 2014  . Neuromuscular disorder ([place])    ?  . Obesity 01-2010  . Obstructive sleep apnea    "can't wear machine; I have claustrophobia" (07/18/2013)  . Osteoarthritis    "knees and hands" (07/18/2013)  . Other and unspecified angina pectoris   . Peripheral vascular disease ([facility])    ? numbness, tingling arms and legs  . Pneumonia 2000's   "  once"  . PONV (postoperative nausea and vomiting)   . Shortness of breath    with exertion  . Stroke ([place]) 03/22/12   right side brain; denies residual on 07/18/2013)  . Stroke [facility]) Oct. 2015   Affected pt.s balance  . Stroke ([place]) 10/2014  . Type II diabetes mellitus ([place])   . Ventral hernia    hx of  . Vomiting    persistent  . Vomiting blood      Past Surgical History:  Procedure Laterality Date  . ANKLE FRACTURE SURGERY Left 1970's  . APPENDECTOMY  1970's   w/hysterectomy  . CARDIAC CATHETERIZATION  10/10/2012   Dr [name].  Marland Kitchen CARDIAC CATHETERIZATION N/A 05/29/2015   Procedure: Right/Left Heart Cath and Coronary/Graft Angiography;  Surgeon: Larey Dresser, MD;  Location: [facility];  Service: Cardiovascular;  Laterality: N/A;  . CAROTID  ENDARTERECTOMY Left 03/2013  . CAROTID STENT INSERTION Left 03/20/2013   Procedure: CAROTID STENT INSERTION;  Surgeon: [name], MD;  Location: [facility] CATH LAB;  Service: Cardiovascular;  Laterality: Left;  internal carotid  . CEREBRAL ANGIOGRAM N/A 04/05/2011   Procedure: CEREBRAL ANGIOGRAM;  Surgeon: Angelia Mould, MD;  Location: [facility] CATH LAB;  Service: Cardiovascular;  Laterality: N/A;  . CHOLECYSTECTOMY  2004  . CORONARY ANGIOPLASTY WITH STENT PLACEMENT  01,02,05,06,07,08,11; 04/24/2013   "I've probably got ~ 10 stents by now" (04/24/2013)  . CORONARY ANGIOPLASTY WITH STENT PLACEMENT  06/13/2013   "got 4 stents today" (06/13/2013)  . CORONARY ARTERY BYPASS GRAFT  1220/11   "CABG X5"  . ESOPHAGOGASTRODUODENOSCOPY  08/03/2011   Procedure: ESOPHAGOGASTRODUODENOSCOPY (EGD);  Surgeon: Shann Medal, MD;  Location: [facility];  Service: General;  Laterality: N/A;  . ESOPHAGOGASTRODUODENOSCOPY (EGD) WITH PROPOFOL N/A 03/11/2014   Procedure: ESOPHAGOGASTRODUODENOSCOPY (EGD) WITH PROPOFOL;  Surgeon: Lafayette Dragon, MD;  Location: [facility] ENDOSCOPY;  Service: Endoscopy;  Laterality: N/A;  . FRACTURE SURGERY    . gall stone removal  05/2003  . GASTRIC BYPASS  1984   "stapeling"  . HERNIA REPAIR  2004   "in my stomach; had OR on it twice", wire mesh on 1 hernia  . PERCUTANEOUS CORONARY STENT INTERVENTION (PCI-S) N/A 06/13/2013   Procedure: PERCUTANEOUS CORONARY STENT INTERVENTION (PCI-S);  Surgeon: [name], MD;  Location: [facility] CATH LAB;  Service: Cardiovascular;  Laterality: N/A;  . PERCUTANEOUS STENT INTERVENTION N/A 04/24/2013   Procedure: PERCUTANEOUS STENT INTERVENTION;  Surgeon: [name], MD;  Location: [facility] CATH LAB;  Service: Cardiovascular;  Laterality: N/A;  . ROOT CANAL  10/2000  . TIBIA FRACTURE SURGERY Right 1970's   rods and pins  . TOOTH EXTRACTION    . TOTAL ABDOMINAL HYSTERECTOMY  1970's   w/ appendectomy  . TUMOR REMOVAL  1970's   ovarian    Allergies  Allergen  Reactions  . Amoxicillin Shortness Of Breath and Rash  . Brilinta [Ticagrelor] Shortness Of Breath  . Erythromycin Shortness Of Breath, Other (See Comments) and Hives    Trouble swallowing  . Flagyl [Metronidazole] Palpitations and Shortness Of Breath  . Penicillins Shortness Of Breath, Rash and Hives  . Isosorbide Mononitrate [Isosorbide Nitrate] Other (See Comments)    Joint aches, muscles hurt, difficult to walk   . Jardiance [Empagliflozin] Other (See Comments)    Nausea, joint aches, muscles aches  . Metformin And Related Other (See Comments)    Stomach pain, cold sweats, joint pain, burred vision, dizziness  . Tape Other (See Comments)    Skin pulls  off with certain types Plastic tape causes skin to rip if left on for long periods of time  . Erythromycin Base Rash   Social History   Social History  . Marital status: Married    Spouse name: [name]   . Number of children: 1  . Years of education: 12+   Occupational History  . retired    Social History Main Topics  . Smoking status: Never Smoker  . Smokeless tobacco: Never Used  . Alcohol use No  . Drug use: No  . Sexual activity: Yes    Birth control/ protection: Surgical     Comment: hysterectomy   Other Topics Concern  . Not on file   Social History Narrative   Patient lives at home with husband [name].    Patient has 1 child.    Patient is not currently working.    Patient has 2 years of college.    Patient is right handed.   Patient drinks caffeine occasionally.     Family History  Problem Relation Age of Onset  . Heart disease Father     Heart Disease before age 91  . Diabetes Father   . Hyperlipidemia Father   . Hypertension Father   . Heart attack Father   . Deep vein thrombosis Father   . AAA (abdominal aortic aneurysm) Father   . Hypertension Mother   . Deep vein thrombosis Mother   . AAA (abdominal aortic aneurysm) Mother   . Cancer Sister     Ovarian  . Hypertension Sister   . Diabetes  Paternal Grandmother   . Heart disease Paternal Uncle   . Diabetes Paternal Uncle   . Heart attack Paternal Grandfather 52    died of MI at 43  . Diabetes Paternal Aunt   . Diabetes Paternal Uncle   . Colon cancer Neg Hx     Review of Systems Constitutional: negative for anorexia, fevers and sweats  Eyes: negative for irritation, redness and visual disturbance  Ears, nose, mouth, throat, and face: negative for earaches, epistaxis, nasal congestion and sore throat  Respiratory: negative for cough, dyspnea on exertion, sputum and wheezing  Cardiovascular: negative for chest pain, dyspnea, lower extremity edema, orthopnea, palpitations and syncope  Gastrointestinal: negative for abdominal pain, constipation, diarrhea, melena, nausea and vomiting  Genitourinary:negative for dysuria, frequency and hematuria  Hematologic/lymphatic: negative for bleeding, easy bruising and lymphadenopathy  Musculoskeletal:negative for arthralgias, muscle weakness and stiff joints  Neurological: negative for coordination problems, gait problems, headaches and weakness  Endocrine: negative for diabetic symptoms including polydipsia, polyuria and weight loss     Objective:   Physical Exam  Gen. Pleasant, obese, in no distress, normal affect ENT - no lesions, no post nasal drip, class 2-3 airway Neck: No JVD, no thyromegaly, no carotid bruits Lungs: no use of accessory muscles, no dullness to percussion, decreased without rales or rhonchi  Cardiovascular: Rhythm regular, heart sounds  normal, no murmurs or gallops, no peripheral edema Abdomen: soft and non-tender, no hepatosplenomegaly, BS normal. Musculoskeletal: No deformities, no cyanosis or clubbing Neuro:  alert, non focal, no tremors       Assessment & Plan:

## 2016-06-02 NOTE — Assessment & Plan Note (Signed)
Continue Advair for maintenance twice daily Okay to use albuterol for rescue

## 2016-06-04 ENCOUNTER — Telehealth: Payer: Self-pay | Admitting: Neurology

## 2016-06-04 NOTE — Telephone Encounter (Signed)
I called the patient. The MRI angiogram shows worsening atherosclerotic changes in the carotid and vertebral basilar system when compared to 2016. The patient is on aspirin and Plavix, she does not smoke. I have indicated that she needs be careful about following up with her doctors when it comes to management of blood pressure, diabetes, and dyslipidemia.Marland Kitchen   MRA head 06/04/16:  IMPRESSION:  This MR angiogram of the intracranial arteries shows the following: 1.    Moderately severe stenosis of the supraclinoid segment of the right internal carotid artery, progressed since the 2016 MR angiogram and moderate stenosis of the cavernous, progressed compared to the 2016 MRA, and milder stenosis of the supraclinoid segments of the left internal carotid artery.    2.    Moderate stenosis of the A1 segment of the left anterior cerebral artery and stenosis within some of the M2 branches of the right middle cerebral artery 3.    Severe stenosis of the basilar artery, progressed when compared to the 2016 MR angiogram and mild distal left vertebral artery stenosis. The vertebral arteries appear to have significantly less stenosis in the current study than on the 2016 MR angiogram. 4.    Severe stenosis of the P1 segment of the left posterior cerebral artery, similar to the previous MR angiogram, with milder stenosis of the P2 segments bilaterally.

## 2016-06-16 ENCOUNTER — Other Ambulatory Visit (HOSPITAL_COMMUNITY): Payer: Self-pay | Admitting: Cardiology

## 2016-06-17 ENCOUNTER — Encounter (HOSPITAL_COMMUNITY)
Admission: RE | Admit: 2016-06-17 | Discharge: 2016-06-17 | Disposition: A | Discharge status: Home / Self Care | Payer: Medicare Other | Source: Ambulatory Visit | Attending: Cardiology | Admitting: Cardiology

## 2016-06-17 ENCOUNTER — Encounter (HOSPITAL_COMMUNITY): Payer: Self-pay

## 2016-06-17 VITALS — BP 140/70 | HR 76 | Ht 62.25 in | Wt 302.7 lb

## 2016-06-17 DIAGNOSIS — I2089 Other forms of angina pectoris: Secondary | ICD-10-CM

## 2016-06-17 DIAGNOSIS — G4734 Idiopathic sleep related nonobstructive alveolar hypoventilation: Secondary | ICD-10-CM | POA: Insufficient documentation

## 2016-06-17 DIAGNOSIS — R0902 Hypoxemia: Secondary | ICD-10-CM | POA: Insufficient documentation

## 2016-06-17 DIAGNOSIS — I208 Other forms of angina pectoris: Secondary | ICD-10-CM

## 2016-06-17 NOTE — Progress Notes (Signed)
Cardiac Individual Treatment Plan  Patient Details  Name: [name] [name] [name]: [id number] Date of Birth: 14-Sep-1945 Referring Provider:     [facility] from 06/17/2016 in [place]  Referring Provider  Loralie Champagne MD      Initial Encounter Date:    CARDIAC REHAB PHASE II ORIENTATION from 06/17/2016 in [place]  Date  06/17/16  Referring Provider  Loralie Champagne MD      Visit Diagnosis: Stable angina [facility])  Patient's Home Medications on Admission:  Current Outpatient Prescriptions:  .  acetaminophen (TYLENOL) 325 MG tablet, Take 650 mg by mouth every 6 (six) hours as needed for mild pain. , Disp: , Rfl:  .  albuterol (PROVENTIL) (2.5 MG/3ML) 0.083% nebulizer solution, Take 2.5 mg by nebulization every 6 (six) hours as needed for shortness of breath (when asthma is active). , Disp: , Rfl:  .  albuterol (PROVENTIL,VENTOLIN) 90 MCG/ACT inhaler, Inhale 2 puffs into the lungs every 6 (six) hours as needed for shortness of breath. , Disp: , Rfl:  .  Alirocumab (PRALUENT) 150 MG/ML SOPN, Inject 1 pen into the skin every 14 (fourteen) days., Disp: 6 pen, Rfl: 3 .  ALPRAZolam (XANAX) 0.5 MG tablet, Take 0.5-1 mg by mouth 3 (three) times daily. 0.5mg  in the morning and at lunch, 1mg  at bedtime, Disp: , Rfl:  .  amLODipine (NORVASC) 10 MG tablet, Take 10 mg by mouth daily., Disp: , Rfl:  .  aspirin EC 81 MG tablet, Take 162 mg by mouth daily., Disp: , Rfl:  .  b complex vitamins capsule, Take 1 capsule by mouth daily., Disp: , Rfl:  .  buPROPion (WELLBUTRIN XL) 150 MG 24 hr tablet, Take 1 tablet by mouth daily., Disp: , Rfl:  .  Calcium Carbonate 1500 (600 CA) MG TABS, Take 1,500 mg by mouth daily with breakfast., Disp: , Rfl:  .  Cholecalciferol (VITAMIN D) 2000 UNITS tablet, Take 2,000 Units by mouth daily with breakfast. , Disp: , Rfl:  .  cloNIDine (CATAPRES) 0.2 MG tablet, Take 0.2 mg by mouth 2 (two)  times daily., Disp: , Rfl:  .  clopidogrel (PLAVIX) 75 MG tablet, TAKE ONE (1) TABLET BY MOUTH EVERY DAY, Disp: 90 tablet, Rfl: 3 .  denosumab (PROLIA) 60 MG/ML SOLN injection, Inject 60 mg into the skin every 6 (six) months. Administer in upper arm, thigh, or abdomen, Disp: , Rfl:  .  DULoxetine (CYMBALTA) 60 MG capsule, Take 30 mg by mouth daily with breakfast. , Disp: , Rfl:  .  ezetimibe (ZETIA) 10 MG tablet, Take 1 tablet (10 mg total) by mouth daily., Disp: 30 tablet, Rfl: 11 .  Fluticasone-Salmeterol (ADVAIR DISKUS) 250-50 MCG/DOSE AEPB, Inhale 1 puff into the lungs every 12 (twelve) hours. , Disp: , Rfl:  .  gabapentin (NEURONTIN) 600 MG tablet, Take 600-750 mg by mouth 3 (three) times daily. 600mg  in the morning and at noon. 750mg  at bedtime., Disp: , Rfl:  .  HUMALOG KWIKPEN 100 UNIT/ML SOPN, Inject 18-26 Units into the skin 4 (four) times daily -  with meals and at bedtime. 10 units at every meal and 14 units at bedtime if needed, Disp: , Rfl:  .  HYDROcodone-acetaminophen (NORCO/VICODIN) 5-325 MG per tablet, Take 1 tablet by mouth at bedtime as needed for moderate pain. , Disp: , Rfl:  .  ipratropium-albuterol (DUONEB) 0.5-2.5 (3) MG/3ML SOLN, Take 3 mLs by nebulization as needed (If severe asthma ,  use every 4 hours for 10 days). , Disp: , Rfl:  .  LANTUS SOLOSTAR 100 UNIT/ML Solostar Pen, Inject 48-52 Units into the skin every morning. , Disp: , Rfl:  .  loperamide (IMODIUM) 2 MG capsule, Take 2 mg by mouth as needed for diarrhea or loose stools., Disp: , Rfl:  .  losartan (COZAAR) 100 MG tablet, Take 100 mg by mouth daily with breakfast. , Disp: , Rfl:  .  magnesium oxide (MAG-OX) 400 MG tablet, Take 400 mg by mouth 2 (two) times daily., Disp: , Rfl:  .  meclizine (ANTIVERT) 12.5 MG tablet, Take 12.5 mg by mouth 3 (three) times daily as needed for dizziness., Disp: , Rfl:  .  methocarbamol (ROBAXIN) 500 MG tablet, TAKE ONE (1) TABLET THREE (3) TIMES EACH DAY, Disp: 60 tablet, Rfl: 1 .   metoprolol succinate (TOPROL-XL) 25 MG 24 hr tablet, TAKE ONE TABLET TWICE DAILY, Disp: 180 tablet, Rfl: 3 .  Multiple Vitamin (MULTIVITAMIN) capsule, Take 1 capsule by mouth daily., Disp: , Rfl:  .  multivitamin-lutein (OCUVITE-LUTEIN) CAPS capsule, Take 1 capsule by mouth daily., Disp: , Rfl:  .  nitroGLYCERIN (NITROSTAT) 0.3 MG SL tablet, Place 1 tablet (0.3 mg total) under the tongue every 5 (five) minutes x 3 doses as needed for chest pain., Disp: 90 tablet, Rfl: 1 .  ondansetron (ZOFRAN) 4 MG tablet, Take 1 tablet (4 mg total) by mouth 2 (two) times daily as needed. For nausea (Patient taking differently: Take 4 mg by mouth 2 (two) times daily. For nausea), Disp: 20 tablet, Rfl: 0 .  pantoprazole (PROTONIX) 40 MG tablet, Take 1 tablet (40 mg total) by mouth 2 (two) times daily., Disp: 180 tablet, Rfl: 0 .  RANEXA 1000 MG SR tablet, TAKE ONE (1) TABLET BY MOUTH TWO (2) TIMES DAILY, Disp: 60 each, Rfl: 3 .  rosuvastatin (CRESTOR) 40 MG tablet, TAKE ONE (1) TABLET EACH DAY, Disp: 90 tablet, Rfl: 6 .  torsemide (DEMADEX) 20 MG tablet, Take 4 tablets (80 mg total) by mouth daily., Disp: 120 tablet, Rfl: 3 .  triamterene-hydrochlorothiazide (MAXZIDE-25) 37.5-25 MG tablet, Take 1 tablet by mouth daily., Disp: , Rfl:  .  vitamin B-12 (CYANOCOBALAMIN) 1000 MCG tablet, Take 1,000 mcg by mouth daily., Disp: , Rfl:  .  zinc gluconate 50 MG tablet, Take 50 mg by mouth at bedtime. , Disp: , Rfl:  .  zolpidem (AMBIEN) 10 MG tablet, Take 10 mg by mouth at bedtime as needed for sleep. , Disp: , Rfl:   Past Medical History: Past Medical History:  Diagnosis Date  . Abnormality of gait 05/14/2014  . Anemia    hx  . Anginal pain ([place])   . Anxiety   . Asthma   . Bundle branch block, left    chronic/notes 07/18/2013  . Cancer [facility]) April 2016   BCC,  Melanoma  . CHF (congestive heart failure) ([place])   . Chronic bronchitis ([place])    "off and on all the time" (07/18/2013)  . Chronic insomnia 05/06/2015  .  Chronic kidney disease    frequency, sses dr Jamal Maes every 4 to 6 months  . Chronic lower back pain   . Claustrophobia   . Common migraine 05/14/2014  . Coronary artery disease   . Dysrhythmia   . GERD (gastroesophageal reflux disease)   . H/O hiatal hernia   . Headache    "probably 4 days/wk" (07/18/2013)  . Heart murmur   . Hyperlipidemia   . Hypertension   .  Irregular heart beat   . Leg cramps    both legs at times  . Memory change 05/14/2014  . Mental disorder    hx of confusion  . Migraine    "used to have them really bad; kind of eased up now; down to 1 q couple months" (07/18/2013)  . Myocardial infarction ([place])    04/1999, 02/2000, 01/2005; 2011; 2014  . Neuromuscular disorder ([place])    ?  . Obesity 01-2010  . Obstructive sleep apnea    "can't wear machine; I have claustrophobia" (07/18/2013)  . Osteoarthritis    "knees and hands" (07/18/2013)  . Other and unspecified angina pectoris   . Peripheral vascular disease ([facility])    ? numbness, tingling arms and legs  . Pneumonia 2000's   "once"  . PONV (postoperative nausea and vomiting)   . Shortness of breath    with exertion  . Stroke ([place]) 03/22/12   right side brain; denies residual on 07/18/2013)  . Stroke [facility]) Oct. 2015   Affected pt.s balance  . Stroke ([place]) 10/2014  . Type II diabetes mellitus ([place])   . Ventral hernia    hx of  . Vomiting    persistent  . Vomiting blood     Tobacco Use: History  Smoking Status  . Never Smoker  Smokeless Tobacco  . Never Used    Labs: Recent Review Flowsheet Data    Labs for ITP Cardiac and Pulmonary Rehab Latest Ref Rng & Units 05/15/2014 11/13/2014 05/29/2015 06/24/2015 03/12/2016   Cholestrol 0 - 200 mg/dL 141 164 - 183 189   LDLCALC 0 - 99 mg/dL 50 75 - 96 97   LDLDIRECT mg/dL - - - - -   HDL >40 mg/dL 55.60 56 - 56 56   Trlycerides <150 mg/dL 179.0(H) 163(H) - 156(H) 181(H)   Hemoglobin A1c <5.7 % - - - - -   PHART 7.350 - 7.400 - - - - -   PCO2ART 35.0 -  45.0 mmHg - - - - -   HCO3 20.0 - 24.0 mEq/L - - 30.3(H) - -   TCO2 0 - 100 mmol/L - - 32 - -   ACIDBASEDEF 0.0 - 2.0 mmol/L - - - - -   O2SAT % - - 70.0 - -      Capillary Blood Glucose: Lab Results  Component Value Date   GLUCAP 151 (H) 05/29/2015   GLUCAP 255 (H) 05/29/2015   GLUCAP 191 (H) 03/11/2014   GLUCAP 164 (H) 11/02/2013   GLUCAP 161 (H) 10/31/2013     Exercise Target Goals: Date: 06/17/16  Exercise Program Goal: Individual exercise prescription set with THRR, safety & activity barriers. Participant demonstrates ability to understand and report RPE using BORG scale, to self-measure pulse accurately, and to acknowledge the importance of the exercise prescription.  Exercise Prescription Goal: Starting with aerobic activity 30 plus minutes a day, 3 days per week for initial exercise prescription. Provide home exercise prescription and guidelines that participant acknowledges understanding prior to discharge.  Activity Barriers & Risk Stratification:     Activity Barriers & Cardiac Risk Stratification - 06/17/16 0837      Activity Barriers & Cardiac Risk Stratification   Cardiac Risk Stratification High      6 Minute Walk:     6 Minute Walk    Row Name 06/17/16 1117         6 Minute Walk   Phase Initial     Distance 463 feet  Walk Time 6 minutes     # of Rest Breaks 0     MPH 0.9     METS 0.6     RPE 17     VO2 Peak 2.1     Symptoms No     Resting HR 76 bpm     Resting BP 140/70     Max Ex. HR 96 bpm     Max Ex. BP 208/96     2 Minute Post BP 165/65        Oxygen Initial Assessment:   Oxygen Re-Evaluation:   Oxygen Discharge (Final Oxygen Re-Evaluation):   Initial Exercise Prescription:     Initial Exercise Prescription - 06/17/16 1100      Date of Initial Exercise RX and Referring Provider   Date 06/17/16   Referring Provider Loralie Champagne MD     NuStep   Level 1   SPM 80   Minutes 20   METs 1.8     Arm Ergometer    Level 1   Watts 25   Minutes 10   METs 1.9     Prescription Details   Frequency (times per week) 3   Duration Progress to 45 minutes of aerobic exercise without signs/symptoms of physical distress     Intensity   THRR 40-80% of Max Heartrate 61-122   Ratings of Perceived Exertion 11-13   Perceived Dyspnea 0-4     Progression   Progression Continue to progress workloads to maintain intensity without signs/symptoms of physical distress.     Resistance Training   Training Prescription Yes   Weight 2   Reps 10-15      Perform Capillary Blood Glucose checks as needed.  Exercise Prescription Changes:   Exercise Comments:   Exercise Goals and Review:     Exercise Goals    Row Name 06/17/16 0837             Exercise Goals   Increase Physical Activity Yes  increase functional mobility       Intervention Provide advice, education, support and counseling about physical activity/exercise needs.;Develop an individualized exercise prescription for aerobic and resistive training based on initial evaluation findings, risk stratification, comorbidities and participant's personal goals.       Expected Outcomes Achievement of increased cardiorespiratory fitness and enhanced flexibility, muscular endurance and strength shown through measurements of functional capacity and personal statement of participant.       Increase Strength and Stamina Yes  develop an exercise routine, learn exercise limitations       Intervention Develop an individualized exercise prescription for aerobic and resistive training based on initial evaluation findings, risk stratification, comorbidities and participant's personal goals.;Provide advice, education, support and counseling about physical activity/exercise needs.       Expected Outcomes Achievement of increased cardiorespiratory fitness and enhanced flexibility, muscular endurance and strength shown through measurements of functional capacity and personal  statement of participant.          Exercise Goals Re-Evaluation :    Discharge Exercise Prescription (Final Exercise Prescription Changes):   Nutrition:  Target Goals: Understanding of nutrition guidelines, daily intake of sodium 1500mg , cholesterol 200mg , calories 30% from fat and 7% or less from saturated fats, daily to have 5 or more servings of fruits and vegetables.  Biometrics:     Pre Biometrics - 06/17/16 1223      Pre Biometrics   Height 5' 2.25" (1.581 m)   Weight (!)  302 lb 11.1 oz (137.3 kg)  Hip Circumference 64 inches   BMI (Calculated) 55   Triceps Skinfold 70 mm   % Body Fat 65.1 %   Grip Strength 28 kg   Flexibility 8 in   Single Leg Stand 1.03 seconds       Nutrition Therapy Plan and Nutrition Goals:   Nutrition Discharge: Nutrition Scores:   Nutrition Goals Re-Evaluation:   Nutrition Goals Re-Evaluation:   Nutrition Goals Discharge (Final Nutrition Goals Re-Evaluation):   Psychosocial: Target Goals: Acknowledge presence or absence of significant depression and/or stress, maximize coping skills, provide positive support system. Participant is able to verbalize types and ability to use techniques and skills needed for reducing stress and depression.  Initial Review & Psychosocial Screening:     Initial Psych Review & Screening - 06/17/16 1221      Initial Review   Current issues with History of Depression     Family Dynamics   Good Support System? Yes     Barriers   Psychosocial barriers to participate in program The patient should benefit from training in stress management and relaxation.     Screening Interventions   Interventions Encouraged to exercise;To provide support and resources with identified psychosocial needs;Provide feedback about the scores to participant      Quality of Life Scores:     Quality of Life - 06/17/16 1124      Quality of Life Scores   Health/Function Pre 19.4 %   Socioeconomic Pre 24.58 %    Psych/Spiritual Pre 19 %   Family Pre 28 %   GLOBAL Pre 21.56 %      PHQ-9: Recent Review Flowsheet Data    Depression screen [facility] 2/9 06/24/2015 04/14/2015 02/04/2015 11/25/2014 11/02/2013   Decreased Interest 1 1 1 1 1    Down, Depressed, Hopeless 1 1 1 1 1    PHQ - 2 Score 2 2 2 2 2    Altered sleeping - - - 2 1   Tired, decreased energy - - - 3 1   Change in appetite - - - 3  1   Feeling bad or failure about yourself  - - - 3 1   Trouble concentrating - - - 3 1   Moving slowly or fidgety/restless - - - 0 0   Suicidal thoughts - - - 0 0   PHQ-9 Score - - - 16 7   Difficult doing work/chores - - - Somewhat difficult -     Interpretation of Total Score  Total Score Depression Severity:  1-4 = Minimal depression, 5-9 = Mild depression, 10-14 = Moderate depression, 15-19 = Moderately severe depression, 20-27 = Severe depression   Psychosocial Evaluation and Intervention:   Psychosocial Re-Evaluation:   Psychosocial Discharge (Final Psychosocial Re-Evaluation):   Vocational Rehabilitation: Provide vocational rehab assistance to qualifying candidates.   Vocational Rehab Evaluation & Intervention:     Vocational Rehab - 06/17/16 1221      Initial Vocational Rehab Evaluation & Intervention   Assessment shows need for Vocational Rehabilitation No      Education: Education Goals: Education classes will be provided on a weekly basis, covering required topics. Participant will state understanding/return demonstration of topics presented.  Learning Barriers/Preferences:     Learning Barriers/Preferences - 06/17/16 0835      Learning Barriers/Preferences   Learning Barriers Sight   Learning Preferences Skilled Demonstration      Education Topics: Count Your Pulse:  -Group instruction provided by verbal instruction, demonstration, patient participation and written materials to support subject.  Instructors address importance of being able to find your pulse and how to  count your pulse when at home without a heart monitor.  Patients get hands on experience counting their pulse with staff help and individually.   Heart Attack, Angina, and Risk Factor Modification:  -Group instruction provided by verbal instruction, video, and written materials to support subject.  Instructors address signs and symptoms of angina and heart attacks.    Also discuss risk factors for heart disease and how to make changes to improve heart health risk factors.   Functional Fitness:  -Group instruction provided by verbal instruction, demonstration, patient participation, and written materials to support subject.  Instructors address safety measures for doing things around the house.  Discuss how to get up and down off the floor, how to pick things up properly, how to safely get out of a chair without assistance, and balance training.   Meditation and Mindfulness:  -Group instruction provided by verbal instruction, patient participation, and written materials to support subject.  Instructor addresses importance of mindfulness and meditation practice to help reduce stress and improve awareness.  Instructor also leads participants through a meditation exercise.    Stretching for Flexibility and Mobility:  -Group instruction provided by verbal instruction, patient participation, and written materials to support subject.  Instructors lead participants through series of stretches that are designed to increase flexibility thus improving mobility.  These stretches are additional exercise for major muscle groups that are typically performed during regular warm up and cool down.   Hands Only CPR:  -Group verbal, video, and participation provides a basic overview of AHA guidelines for community CPR. Role-play of emergencies allow participants the opportunity to practice calling for help and chest compression technique with discussion of AED use.   Hypertension: -Group verbal and written  instruction that provides a basic overview of hypertension including the most recent diagnostic guidelines, risk factor reduction with self-care instructions and medication management.    Nutrition I class: Heart Healthy Eating:  -Group instruction provided by PowerPoint slides, verbal discussion, and written materials to support subject matter. The instructor gives an explanation and review of the Therapeutic Lifestyle Changes diet recommendations, which includes a discussion on lipid goals, dietary fat, sodium, fiber, plant stanol/sterol esters, sugar, and the components of a well-balanced, healthy diet.   Nutrition II class: Lifestyle Skills:  -Group instruction provided by PowerPoint slides, verbal discussion, and written materials to support subject matter. The instructor gives an explanation and review of label reading, grocery shopping for heart health, heart healthy recipe modifications, and ways to make healthier choices when eating out.   Diabetes Question & Answer:  -Group instruction provided by PowerPoint slides, verbal discussion, and written materials to support subject matter. The instructor gives an explanation and review of diabetes co-morbidities, pre- and post-prandial blood glucose goals, pre-exercise blood glucose goals, signs, symptoms, and treatment of hypoglycemia and hyperglycemia, and foot care basics.   Diabetes Blitz:  -Group instruction provided by PowerPoint slides, verbal discussion, and written materials to support subject matter. The instructor gives an explanation and review of the physiology behind type 1 and type 2 diabetes, diabetes medications and rational behind using different medications, pre- and post-prandial blood glucose recommendations and Hemoglobin A1c goals, diabetes diet, and exercise including blood glucose guidelines for exercising safely.    Portion Distortion:  -Group instruction provided by PowerPoint slides, verbal discussion, written  materials, and food models to support subject matter. The instructor gives an explanation of serving size versus portion  size, changes in portions sizes over the last 20 years, and what consists of a serving from each food group.   Stress Management:  -Group instruction provided by verbal instruction, video, and written materials to support subject matter.  Instructors review role of stress in heart disease and how to cope with stress positively.     Exercising on Your Own:  -Group instruction provided by verbal instruction, power point, and written materials to support subject.  Instructors discuss benefits of exercise, components of exercise, frequency and intensity of exercise, and end points for exercise.  Also discuss use of nitroglycerin and activating EMS.  Review options of places to exercise outside of rehab.  Review guidelines for sex with heart disease.   Cardiac Drugs I:  -Group instruction provided by verbal instruction and written materials to support subject.  Instructor reviews cardiac drug classes: antiplatelets, anticoagulants, beta blockers, and statins.  Instructor discusses reasons, side effects, and lifestyle considerations for each drug class.   Cardiac Drugs II:  -Group instruction provided by verbal instruction and written materials to support subject.  Instructor reviews cardiac drug classes: angiotensin converting enzyme inhibitors (ACE-I), angiotensin II receptor blockers (ARBs), nitrates, and calcium channel blockers.  Instructor discusses reasons, side effects, and lifestyle considerations for each drug class.   Anatomy and Physiology of the Circulatory System:  Group verbal and written instruction and models provide basic cardiac anatomy and physiology, with the coronary electrical and arterial systems. Review of: AMI, Angina, Valve disease, Heart Failure, Peripheral Artery Disease, Cardiac Arrhythmia, Pacemakers, and the ICD.   Other Education:  -Group or  individual verbal, written, or video instructions that support the educational goals of the cardiac rehab program.   Knowledge Questionnaire Score:     Knowledge Questionnaire Score - 06/17/16 1115      Knowledge Questionnaire Score   Pre Score 23/24      Core Components/Risk Factors/Patient Goals at Admission:     Personal Goals and Risk Factors at Admission - 06/17/16 0854      Core Components/Risk Factors/Patient Goals on Admission    Weight Management Yes;Obesity;Weight Maintenance;Weight Loss   Intervention Weight Management: Develop a combined nutrition and exercise program designed to reach desired caloric intake, while maintaining appropriate intake of nutrient and fiber, sodium and fats, and appropriate energy expenditure required for the weight goal.;Weight Management: Provide education and appropriate resources to help participant work on and attain dietary goals.;Weight Management/Obesity: Establish reasonable short term and long term weight goals.;Obesity: Provide education and appropriate resources to help participant work on and attain dietary goals.   Expected Outcomes Long Term: Adherence to nutrition and physical activity/exercise program aimed toward attainment of established weight goal;Short Term: Continue to assess and modify interventions until short term weight is achieved;Weight Maintenance: Understanding of the daily nutrition guidelines, which includes 25-35% calories from fat, 7% or less cal from saturated fats, less than 200mg  cholesterol, less than 1.5gm of sodium, & 5 or more servings of fruits and vegetables daily;Weight Loss: Understanding of general recommendations for a balanced deficit meal plan, which promotes 1-2 lb weight loss per week and includes a negative energy balance of [phone] kcal/d;Understanding recommendations for meals to include 15-35% energy as protein, 25-35% energy from fat, 35-60% energy from carbohydrates, less than 200mg  of dietary  cholesterol, 20-35 gm of total fiber daily;Understanding of distribution of calorie intake throughout the day with the consumption of 4-5 meals/snacks   Improve shortness of breath with ADL's Yes   Intervention Provide education, individualized exercise plan  and daily activity instruction to help decrease symptoms of SOB with activities of daily living.   Expected Outcomes Short Term: Achieves a reduction of symptoms when performing activities of daily living.   Diabetes Yes   Intervention Provide education about signs/symptoms and action to take for hypo/hyperglycemia.;Provide education about proper nutrition, including hydration, and aerobic/resistive exercise prescription along with prescribed medications to achieve blood glucose in normal ranges: Fasting glucose 65-99 mg/dL   Expected Outcomes Short Term: Participant verbalizes understanding of the signs/symptoms and immediate care of hyper/hypoglycemia, proper foot care and importance of medication, aerobic/resistive exercise and nutrition plan for blood glucose control.;Long Term: Attainment of HbA1C < 7%.   Heart Failure Yes   Intervention Provide a combined exercise and nutrition program that is supplemented with education, support and counseling about heart failure. Directed toward relieving symptoms such as shortness of breath, decreased exercise tolerance, and extremity edema.   Expected Outcomes Improve functional capacity of life;Short term: Attendance in program 2-3 days a week with increased exercise capacity. Reported lower sodium intake. Reported increased fruit and vegetable intake. Reports medication compliance.;Short term: Daily weights obtained and reported for increase. Utilizing diuretic protocols set by physician.;Long term: Adoption of self-care skills and reduction of barriers for early signs and symptoms recognition and intervention leading to self-care maintenance.      Core Components/Risk Factors/Patient Goals Review:     Core Components/Risk Factors/Patient Goals at Discharge (Final Review):    ITP Comments:     ITP Comments    Row Name 06/17/16 0826           ITP Comments Medical Director, Dr. Fransico Him          Comments:Tahnee attended orientation from 0800 to 1000 to review rules and guidelines for program. Completed 6 minute walk test and walked 2 laps, Intitial ITP, and exercise prescription. [name]'s blood pressure went up to 208/96 immediately after her walk test then 165/65. Exit blood pressure 122/70. Dr Claris Gladden office was notified about  of today's elevated blood pressure.I spoke with [place]. Telemetry-Sinus Rhythm with arrhythmia with a bundle branch block this has been previously documented.  Lance completed her walk test with a  Rolling walker and is deconditioned. Cydney hopes participating in phase 2 cardiac rehab will give her more energy Today's ECG tracings and vital signs faxed to Dr [facility] office for review.Barnet Pall, RN,BSN 06/17/2016 12:36 PM

## 2016-06-17 NOTE — Progress Notes (Signed)
Cardiac Rehab Medication Review by a Pharmacist  Does the patient  feel that his/her medications are working for him/her?  yes  Has the patient been experiencing any side effects to the medications prescribed?  no  Does the patient measure his/her own blood pressure or blood glucose at home?  yes   Does the patient have any problems obtaining medications due to transportation or finances?   no  Understanding of regimen: excellent Understanding of indications: good Potential of compliance: excellent    Pharmacist comments: [name] [name] presents today for medication review. She is extremely organized and carries a complete and up to date list of all of her medications, allergies, and medical history. I have updated recent changes in EPIC. She reports never missing any doses of medication and believes she is tolerating the medications well. Only side effect that she reports is throwing up every morning but she believes this is due to her h/o gastric bypass surgery. She takes Zofran BID to help with this.    [name] [name] 06/17/2016 8:57 AM

## 2016-06-23 ENCOUNTER — Encounter (HOSPITAL_COMMUNITY)
Admission: RE | Admit: 2016-06-23 | Discharge: 2016-06-23 | Disposition: A | Discharge status: Home / Self Care | Payer: Medicare Other | Source: Ambulatory Visit | Attending: Cardiology | Admitting: Cardiology

## 2016-06-23 DIAGNOSIS — R0902 Hypoxemia: Secondary | ICD-10-CM | POA: Diagnosis not present

## 2016-06-23 DIAGNOSIS — I208 Other forms of angina pectoris: Secondary | ICD-10-CM

## 2016-06-23 LAB — GLUCOSE, CAPILLARY
Glucose-Capillary: 235 mg/dL — ABNORMAL HIGH (ref 65–99)
Glucose-Capillary: 174 mg/dL — ABNORMAL HIGH (ref 65–99)

## 2016-06-23 NOTE — Progress Notes (Signed)
Cardiac Individual Treatment Plan  Patient Details  Name: [name] [name] [name]: [id number] Date of Birth: 04-13-1945 Referring Provider:     [place] from 06/17/2016 in [place]  Referring Provider  Loralie Champagne MD      Initial Encounter Date:    CARDIAC REHAB PHASE II ORIENTATION from 06/17/2016 in [place]  Date  06/17/16  Referring Provider  Loralie Champagne MD      Visit Diagnosis: Stable angina [facility])  Patient's Home Medications on Admission:  Current Outpatient Prescriptions:  .  acetaminophen (TYLENOL) 325 MG tablet, Take 650 mg by mouth every 6 (six) hours as needed for mild pain. , Disp: , Rfl:  .  albuterol (PROVENTIL) (2.5 MG/3ML) 0.083% nebulizer solution, Take 2.5 mg by nebulization every 6 (six) hours as needed for shortness of breath (when asthma is active). , Disp: , Rfl:  .  albuterol (PROVENTIL,VENTOLIN) 90 MCG/ACT inhaler, Inhale 2 puffs into the lungs every 6 (six) hours as needed for shortness of breath. , Disp: , Rfl:  .  Alirocumab (PRALUENT) 150 MG/ML SOPN, Inject 1 pen into the skin every 14 (fourteen) days., Disp: 6 pen, Rfl: 3 .  ALPRAZolam (XANAX) 0.5 MG tablet, Take 0.5-1 mg by mouth 3 (three) times daily. 0.5mg  in the morning and at lunch, 1mg  at bedtime, Disp: , Rfl:  .  amLODipine (NORVASC) 10 MG tablet, Take 10 mg by mouth daily., Disp: , Rfl:  .  aspirin EC 81 MG tablet, Take 162 mg by mouth daily., Disp: , Rfl:  .  b complex vitamins capsule, Take 1 capsule by mouth daily., Disp: , Rfl:  .  buPROPion (WELLBUTRIN XL) 150 MG 24 hr tablet, Take 1 tablet by mouth daily., Disp: , Rfl:  .  Calcium Carbonate 1500 (600 CA) MG TABS, Take 1,500 mg by mouth daily with breakfast., Disp: , Rfl:  .  Cholecalciferol (VITAMIN D) 2000 UNITS tablet, Take 2,000 Units by mouth daily with breakfast. , Disp: , Rfl:  .  cloNIDine (CATAPRES) 0.2 MG tablet, Take 0.2 mg by mouth 2 (two)  times daily., Disp: , Rfl:  .  clopidogrel (PLAVIX) 75 MG tablet, TAKE ONE (1) TABLET BY MOUTH EVERY DAY, Disp: 90 tablet, Rfl: 3 .  denosumab (PROLIA) 60 MG/ML SOLN injection, Inject 60 mg into the skin every 6 (six) months. Administer in upper arm, thigh, or abdomen, Disp: , Rfl:  .  DULoxetine (CYMBALTA) 60 MG capsule, Take 30 mg by mouth daily with breakfast. , Disp: , Rfl:  .  Fluticasone-Salmeterol (ADVAIR DISKUS) 250-50 MCG/DOSE AEPB, Inhale 1 puff into the lungs every 12 (twelve) hours. , Disp: , Rfl:  .  gabapentin (NEURONTIN) 600 MG tablet, Take 600-750 mg by mouth 3 (three) times daily. 600mg  in the morning and at noon. 750mg  at bedtime., Disp: , Rfl:  .  HUMALOG KWIKPEN 100 UNIT/ML SOPN, Inject 18-26 Units into the skin 4 (four) times daily -  with meals and at bedtime. 10 units at every meal and 14 units at bedtime if needed, Disp: , Rfl:  .  HYDROcodone-acetaminophen (NORCO/VICODIN) 5-325 MG per tablet, Take 1 tablet by mouth at bedtime as needed for moderate pain. , Disp: , Rfl:  .  ipratropium-albuterol (DUONEB) 0.5-2.5 (3) MG/3ML SOLN, Take 3 mLs by nebulization as needed (If severe asthma , use every 4 hours for 10 days). , Disp: , Rfl:  .  LANTUS SOLOSTAR 100 UNIT/ML Solostar Pen,  Inject 48-52 Units into the skin every morning. , Disp: , Rfl:  .  loperamide (IMODIUM) 2 MG capsule, Take 2 mg by mouth as needed for diarrhea or loose stools., Disp: , Rfl:  .  losartan (COZAAR) 100 MG tablet, Take 100 mg by mouth daily with breakfast. , Disp: , Rfl:  .  magnesium oxide (MAG-OX) 400 MG tablet, Take 400 mg by mouth 2 (two) times daily., Disp: , Rfl:  .  meclizine (ANTIVERT) 12.5 MG tablet, Take 12.5 mg by mouth 3 (three) times daily as needed for dizziness., Disp: , Rfl:  .  methocarbamol (ROBAXIN) 500 MG tablet, TAKE ONE (1) TABLET THREE (3) TIMES EACH DAY, Disp: 60 tablet, Rfl: 1 .  metoprolol succinate (TOPROL-XL) 25 MG 24 hr tablet, TAKE ONE TABLET TWICE DAILY, Disp: 180 tablet, Rfl:  3 .  Multiple Vitamin (MULTIVITAMIN) capsule, Take 1 capsule by mouth daily., Disp: , Rfl:  .  multivitamin-lutein (OCUVITE-LUTEIN) CAPS capsule, Take 1 capsule by mouth daily., Disp: , Rfl:  .  nitroGLYCERIN (NITROSTAT) 0.3 MG SL tablet, Place 1 tablet (0.3 mg total) under the tongue every 5 (five) minutes x 3 doses as needed for chest pain., Disp: 90 tablet, Rfl: 1 .  ondansetron (ZOFRAN) 4 MG tablet, Take 1 tablet (4 mg total) by mouth 2 (two) times daily as needed. For nausea (Patient taking differently: Take 4 mg by mouth 2 (two) times daily. For nausea), Disp: 20 tablet, Rfl: 0 .  pantoprazole (PROTONIX) 40 MG tablet, Take 1 tablet (40 mg total) by mouth 2 (two) times daily., Disp: 180 tablet, Rfl: 0 .  RANEXA 1000 MG SR tablet, TAKE ONE (1) TABLET BY MOUTH TWO (2) TIMES DAILY, Disp: 60 each, Rfl: 6 .  rosuvastatin (CRESTOR) 40 MG tablet, TAKE ONE (1) TABLET EACH DAY, Disp: 90 tablet, Rfl: 6 .  torsemide (DEMADEX) 20 MG tablet, Take 4 tablets (80 mg total) by mouth daily., Disp: 120 tablet, Rfl: 3 .  triamterene-hydrochlorothiazide (MAXZIDE-25) 37.5-25 MG tablet, Take 1 tablet by mouth daily., Disp: , Rfl:  .  vitamin B-12 (CYANOCOBALAMIN) 1000 MCG tablet, Take 1,000 mcg by mouth daily., Disp: , Rfl:  .  zinc gluconate 50 MG tablet, Take 50 mg by mouth at bedtime. , Disp: , Rfl:  .  zolpidem (AMBIEN) 10 MG tablet, Take 10 mg by mouth at bedtime as needed for sleep. , Disp: , Rfl:  .  ezetimibe (ZETIA) 10 MG tablet, Take 1 tablet (10 mg total) by mouth daily., Disp: 30 tablet, Rfl: 11  Past Medical History: Past Medical History:  Diagnosis Date  . Abnormality of gait 05/14/2014  . Anemia    hx  . Anginal pain ([place])   . Anxiety   . Asthma   . Bundle branch block, left    chronic/notes 07/18/2013  . Cancer [facility]) April 2016   BCC,  Melanoma  . CHF (congestive heart failure) ([place])   . Chronic bronchitis ([place])    "off and on all the time" (07/18/2013)  . Chronic insomnia 05/06/2015  .  Chronic kidney disease    frequency, sses dr Jamal Maes every 4 to 6 months  . Chronic lower back pain   . Claustrophobia   . Common migraine 05/14/2014  . Coronary artery disease   . Dysrhythmia   . GERD (gastroesophageal reflux disease)   . H/O hiatal hernia   . Headache    "probably 4 days/wk" (07/18/2013)  . Heart murmur   . Hyperlipidemia   .  Hypertension   . Irregular heart beat   . Leg cramps    both legs at times  . Memory change 05/14/2014  . Mental disorder    hx of confusion  . Migraine    "used to have them really bad; kind of eased up now; down to 1 q couple months" (07/18/2013)  . Myocardial infarction ([place])    04/1999, 02/2000, 01/2005; 2011; 2014  . Neuromuscular disorder ([place])    ?  . Obesity 01-2010  . Obstructive sleep apnea    "can't wear machine; I have claustrophobia" (07/18/2013)  . Osteoarthritis    "knees and hands" (07/18/2013)  . Other and unspecified angina pectoris   . Peripheral vascular disease (H[facility])    ? numbness, tingling arms and legs  . Pneumonia 2000's   "once"  . PONV (postoperative nausea and vomiting)   . Shortness of breath    with exertion  . Stroke ([place]) 03/22/12   right side brain; denies residual on 07/18/2013)  . Stroke [facility]) Oct. 2015   Affected pt.s balance  . Stroke ([place]) 10/2014  . Type II diabetes mellitus ([place])   . Ventral hernia    hx of  . Vomiting    persistent  . Vomiting blood     Tobacco Use: History  Smoking Status  . Never Smoker  Smokeless Tobacco  . Never Used    Labs: Recent Review Flowsheet Data    Labs for ITP Cardiac and Pulmonary Rehab Latest Ref Rng & Units 05/15/2014 11/13/2014 05/29/2015 06/24/2015 03/12/2016   Cholestrol 0 - 200 mg/dL 141 164 - 183 189   LDLCALC 0 - 99 mg/dL 50 75 - 96 97   LDLDIRECT mg/dL - - - - -   HDL >40 mg/dL 55.60 56 - 56 56   Trlycerides <150 mg/dL 179.0(H) 163(H) - 156(H) 181(H)   Hemoglobin A1c <5.7 % - - - - -   PHART 7.350 - 7.400 - - - - -   PCO2ART 35.0 -  45.0 mmHg - - - - -   HCO3 20.0 - 24.0 mEq/L - - 30.3(H) - -   TCO2 0 - 100 mmol/L - - 32 - -   ACIDBASEDEF 0.0 - 2.0 mmol/L - - - - -   O2SAT % - - 70.0 - -      Capillary Blood Glucose: Lab Results  Component Value Date   GLUCAP 174 (H) 06/23/2016   GLUCAP 235 (H) 06/23/2016   GLUCAP 151 (H) 05/29/2015   GLUCAP 255 (H) 05/29/2015   GLUCAP 191 (H) 03/11/2014     Exercise Target Goals:    Exercise Program Goal: Individual exercise prescription set with THRR, safety & activity barriers. Participant demonstrates ability to understand and report RPE using BORG scale, to self-measure pulse accurately, and to acknowledge the importance of the exercise prescription.  Exercise Prescription Goal: Starting with aerobic activity 30 plus minutes a day, 3 days per week for initial exercise prescription. Provide home exercise prescription and guidelines that participant acknowledges understanding prior to discharge.  Activity Barriers & Risk Stratification:     Activity Barriers & Cardiac Risk Stratification - 06/17/16 0837      Activity Barriers & Cardiac Risk Stratification   Cardiac Risk Stratification High      6 Minute Walk:     6 Minute Walk    Row Name 06/17/16 1117         6 Minute Walk   Phase Initial     Distance  463 feet     Walk Time 6 minutes     # of Rest Breaks 0     MPH 0.9     METS 0.6     RPE 17     VO2 Peak 2.1     Symptoms No     Resting HR 76 bpm     Resting BP 140/70     Max Ex. HR 96 bpm     Max Ex. BP 208/96     2 Minute Post BP 165/65        Oxygen Initial Assessment:   Oxygen Re-Evaluation:   Oxygen Discharge (Final Oxygen Re-Evaluation):   Initial Exercise Prescription:     Initial Exercise Prescription - 06/17/16 1100      Date of Initial Exercise RX and Referring Provider   Date 06/17/16   Referring Provider Loralie Champagne MD     NuStep   Level 1   SPM 80   Minutes 20   METs 1.8     Arm Ergometer   Level 1    Watts 25   Minutes 10   METs 1.9     Prescription Details   Frequency (times per week) 3   Duration Progress to 45 minutes of aerobic exercise without signs/symptoms of physical distress     Intensity   THRR 40-80% of Max Heartrate 61-122   Ratings of Perceived Exertion 11-13   Perceived Dyspnea 0-4     Progression   Progression Continue to progress workloads to maintain intensity without signs/symptoms of physical distress.     Resistance Training   Training Prescription Yes   Weight 2   Reps 10-15      Perform Capillary Blood Glucose checks as needed.  Exercise Prescription Changes:   Exercise Comments:     Exercise Comments    Row Name 06/23/16 1117           Exercise Comments Pt is off to a good start with exercise!          Exercise Goals and Review:     Exercise Goals    Row Name 06/17/16 0837             Exercise Goals   Increase Physical Activity Yes  increase functional mobility       Intervention Provide advice, education, support and counseling about physical activity/exercise needs.;Develop an individualized exercise prescription for aerobic and resistive training based on initial evaluation findings, risk stratification, comorbidities and participant's personal goals.       Expected Outcomes Achievement of increased cardiorespiratory fitness and enhanced flexibility, muscular endurance and strength shown through measurements of functional capacity and personal statement of participant.       Increase Strength and Stamina Yes  develop an exercise routine, learn exercise limitations       Intervention Develop an individualized exercise prescription for aerobic and resistive training based on initial evaluation findings, risk stratification, comorbidities and participant's personal goals.;Provide advice, education, support and counseling about physical activity/exercise needs.       Expected Outcomes Achievement of increased cardiorespiratory  fitness and enhanced flexibility, muscular endurance and strength shown through measurements of functional capacity and personal statement of participant.          Exercise Goals Re-Evaluation :    Discharge Exercise Prescription (Final Exercise Prescription Changes):   Nutrition:  Target Goals: Understanding of nutrition guidelines, daily intake of sodium 1500mg , cholesterol 200mg , calories 30% from fat and 7% or less from saturated fats,  daily to have 5 or more servings of fruits and vegetables.  Biometrics:     Pre Biometrics - 06/17/16 1223      Pre Biometrics   Height 5' 2.25" (1.581 m)   Weight (!)  302 lb 11.1 oz (137.3 kg)   Hip Circumference 64 inches   BMI (Calculated) 55   Triceps Skinfold 70 mm   % Body Fat 65.1 %   Grip Strength 28 kg   Flexibility 8 in   Single Leg Stand 1.03 seconds       Nutrition Therapy Plan and Nutrition Goals:   Nutrition Discharge: Nutrition Scores:   Nutrition Goals Re-Evaluation:   Nutrition Goals Re-Evaluation:   Nutrition Goals Discharge (Final Nutrition Goals Re-Evaluation):   Psychosocial: Target Goals: Acknowledge presence or absence of significant depression and/or stress, maximize coping skills, provide positive support system. Participant is able to verbalize types and ability to use techniques and skills needed for reducing stress and depression.  Initial Review & Psychosocial Screening:     Initial Psych Review & Screening - 06/17/16 1221      Initial Review   Current issues with History of Depression     Family Dynamics   Good Support System? Yes     Barriers   Psychosocial barriers to participate in program The patient should benefit from training in stress management and relaxation.     Screening Interventions   Interventions Encouraged to exercise;To provide support and resources with identified psychosocial needs;Provide feedback about the scores to participant      Quality of Life Scores:      Quality of Life - 06/17/16 1124      Quality of Life Scores   Health/Function Pre 19.4 %   Socioeconomic Pre 24.58 %   Psych/Spiritual Pre 19 %   Family Pre 28 %   GLOBAL Pre 21.56 %      PHQ-9: Recent Review Flowsheet Data    Depression screen [facility] 2/9 06/23/2016 06/24/2015 04/14/2015 02/04/2015 11/25/2014   Decreased Interest 0 1 1 1 1    Down, Depressed, Hopeless 1  1 1 1 1    PHQ - 2 Score 1 2 2 2 2    Altered sleeping - - - - 2   Tired, decreased energy - - - - 3   Change in appetite - - - - 3    Feeling bad or failure about yourself  - - - - 3   Trouble concentrating - - - - 3   Moving slowly or fidgety/restless - - - - 0   Suicidal thoughts - - - - 0   PHQ-9 Score - - - - 16   Difficult doing work/chores - - - - Somewhat difficult     Interpretation of Total Score  Total Score Depression Severity:  1-4 = Minimal depression, 5-9 = Mild depression, 10-14 = Moderate depression, 15-19 = Moderately severe depression, 20-27 = Severe depression   Psychosocial Evaluation and Intervention:   Psychosocial Re-Evaluation:   Psychosocial Discharge (Final Psychosocial Re-Evaluation):   Vocational Rehabilitation: Provide vocational rehab assistance to qualifying candidates.   Vocational Rehab Evaluation & Intervention:     Vocational Rehab - 06/17/16 1221      Initial Vocational Rehab Evaluation & Intervention   Assessment shows need for Vocational Rehabilitation No      Education: Education Goals: Education classes will be provided on a weekly basis, covering required topics. Participant will state understanding/return demonstration of topics presented.  Learning Barriers/Preferences:  Learning Barriers/Preferences - 06/17/16 0960      Learning Barriers/Preferences   Learning Barriers Sight   Learning Preferences Skilled Demonstration      Education Topics: Count Your Pulse:  -Group instruction provided by verbal instruction, demonstration, patient  participation and written materials to support subject.  Instructors address importance of being able to find your pulse and how to count your pulse when at home without a heart monitor.  Patients get hands on experience counting their pulse with staff help and individually.   Heart Attack, Angina, and Risk Factor Modification:  -Group instruction provided by verbal instruction, video, and written materials to support subject.  Instructors address signs and symptoms of angina and heart attacks.    Also discuss risk factors for heart disease and how to make changes to improve heart health risk factors.   CARDIAC REHAB PHASE II EXERCISE from 06/23/2016 in [place]  Date  06/23/16  Instruction Review Code  2- meets goals/outcomes      Functional Fitness:  -Group instruction provided by verbal instruction, demonstration, patient participation, and written materials to support subject.  Instructors address safety measures for doing things around the house.  Discuss how to get up and down off the floor, how to pick things up properly, how to safely get out of a chair without assistance, and balance training.   Meditation and Mindfulness:  -Group instruction provided by verbal instruction, patient participation, and written materials to support subject.  Instructor addresses importance of mindfulness and meditation practice to help reduce stress and improve awareness.  Instructor also leads participants through a meditation exercise.    Stretching for Flexibility and Mobility:  -Group instruction provided by verbal instruction, patient participation, and written materials to support subject.  Instructors lead participants through series of stretches that are designed to increase flexibility thus improving mobility.  These stretches are additional exercise for major muscle groups that are typically performed during regular warm up and cool down.   Hands Only CPR:   -Group verbal, video, and participation provides a basic overview of AHA guidelines for community CPR. Role-play of emergencies allow participants the opportunity to practice calling for help and chest compression technique with discussion of AED use.   Hypertension: -Group verbal and written instruction that provides a basic overview of hypertension including the most recent diagnostic guidelines, risk factor reduction with self-care instructions and medication management.    Nutrition I class: Heart Healthy Eating:  -Group instruction provided by PowerPoint slides, verbal discussion, and written materials to support subject matter. The instructor gives an explanation and review of the Therapeutic Lifestyle Changes diet recommendations, which includes a discussion on lipid goals, dietary fat, sodium, fiber, plant stanol/sterol esters, sugar, and the components of a well-balanced, healthy diet.   Nutrition II class: Lifestyle Skills:  -Group instruction provided by PowerPoint slides, verbal discussion, and written materials to support subject matter. The instructor gives an explanation and review of label reading, grocery shopping for heart health, heart healthy recipe modifications, and ways to make healthier choices when eating out.   Diabetes Question & Answer:  -Group instruction provided by PowerPoint slides, verbal discussion, and written materials to support subject matter. The instructor gives an explanation and review of diabetes co-morbidities, pre- and post-prandial blood glucose goals, pre-exercise blood glucose goals, signs, symptoms, and treatment of hypoglycemia and hyperglycemia, and foot care basics.   Diabetes Blitz:  -Group instruction provided by PowerPoint slides, verbal discussion, and written materials to support subject  matter. The instructor gives an explanation and review of the physiology behind type 1 and type 2 diabetes, diabetes medications and rational behind  using different medications, pre- and post-prandial blood glucose recommendations and Hemoglobin A1c goals, diabetes diet, and exercise including blood glucose guidelines for exercising safely.    Portion Distortion:  -Group instruction provided by PowerPoint slides, verbal discussion, written materials, and food models to support subject matter. The instructor gives an explanation of serving size versus portion size, changes in portions sizes over the last 20 years, and what consists of a serving from each food group.   Stress Management:  -Group instruction provided by verbal instruction, video, and written materials to support subject matter.  Instructors review role of stress in heart disease and how to cope with stress positively.     Exercising on Your Own:  -Group instruction provided by verbal instruction, power point, and written materials to support subject.  Instructors discuss benefits of exercise, components of exercise, frequency and intensity of exercise, and end points for exercise.  Also discuss use of nitroglycerin and activating EMS.  Review options of places to exercise outside of rehab.  Review guidelines for sex with heart disease.   Cardiac Drugs I:  -Group instruction provided by verbal instruction and written materials to support subject.  Instructor reviews cardiac drug classes: antiplatelets, anticoagulants, beta blockers, and statins.  Instructor discusses reasons, side effects, and lifestyle considerations for each drug class.   Cardiac Drugs II:  -Group instruction provided by verbal instruction and written materials to support subject.  Instructor reviews cardiac drug classes: angiotensin converting enzyme inhibitors (ACE-I), angiotensin II receptor blockers (ARBs), nitrates, and calcium channel blockers.  Instructor discusses reasons, side effects, and lifestyle considerations for each drug class.   Anatomy and Physiology of the Circulatory System:  Group  verbal and written instruction and models provide basic cardiac anatomy and physiology, with the coronary electrical and arterial systems. Review of: AMI, Angina, Valve disease, Heart Failure, Peripheral Artery Disease, Cardiac Arrhythmia, Pacemakers, and the ICD.   Other Education:  -Group or individual verbal, written, or video instructions that support the educational goals of the cardiac rehab program.   Knowledge Questionnaire Score:     Knowledge Questionnaire Score - 06/17/16 1115      Knowledge Questionnaire Score   Pre Score 23/24      Core Components/Risk Factors/Patient Goals at Admission:     Personal Goals and Risk Factors at Admission - 06/17/16 0854      Core Components/Risk Factors/Patient Goals on Admission    Weight Management Yes;Obesity;Weight Maintenance;Weight Loss   Intervention Weight Management: Develop a combined nutrition and exercise program designed to reach desired caloric intake, while maintaining appropriate intake of nutrient and fiber, sodium and fats, and appropriate energy expenditure required for the weight goal.;Weight Management: Provide education and appropriate resources to help participant work on and attain dietary goals.;Weight Management/Obesity: Establish reasonable short term and long term weight goals.;Obesity: Provide education and appropriate resources to help participant work on and attain dietary goals.   Expected Outcomes Long Term: Adherence to nutrition and physical activity/exercise program aimed toward attainment of established weight goal;Short Term: Continue to assess and modify interventions until short term weight is achieved;Weight Maintenance: Understanding of the daily nutrition guidelines, which includes 25-35% calories from fat, 7% or less cal from saturated fats, less than 200mg  cholesterol, less than 1.5gm of sodium, & 5 or more servings of fruits and vegetables daily;Weight Loss: Understanding of general recommendations for  a  balanced deficit meal plan, which promotes 1-2 lb weight loss per week and includes a negative energy balance of [phone] kcal/d;Understanding recommendations for meals to include 15-35% energy as protein, 25-35% energy from fat, 35-60% energy from carbohydrates, less than 200mg  of dietary cholesterol, 20-35 gm of total fiber daily;Understanding of distribution of calorie intake throughout the day with the consumption of 4-5 meals/snacks   Improve shortness of breath with ADL's Yes   Intervention Provide education, individualized exercise plan and daily activity instruction to help decrease symptoms of SOB with activities of daily living.   Expected Outcomes Short Term: Achieves a reduction of symptoms when performing activities of daily living.   Diabetes Yes   Intervention Provide education about signs/symptoms and action to take for hypo/hyperglycemia.;Provide education about proper nutrition, including hydration, and aerobic/resistive exercise prescription along with prescribed medications to achieve blood glucose in normal ranges: Fasting glucose 65-99 mg/dL   Expected Outcomes Short Term: Participant verbalizes understanding of the signs/symptoms and immediate care of hyper/hypoglycemia, proper foot care and importance of medication, aerobic/resistive exercise and nutrition plan for blood glucose control.;Long Term: Attainment of HbA1C < 7%.   Heart Failure Yes   Intervention Provide a combined exercise and nutrition program that is supplemented with education, support and counseling about heart failure. Directed toward relieving symptoms such as shortness of breath, decreased exercise tolerance, and extremity edema.   Expected Outcomes Improve functional capacity of life;Short term: Attendance in program 2-3 days a week with increased exercise capacity. Reported lower sodium intake. Reported increased fruit and vegetable intake. Reports medication compliance.;Short term: Daily weights obtained and  reported for increase. Utilizing diuretic protocols set by physician.;Long term: Adoption of self-care skills and reduction of barriers for early signs and symptoms recognition and intervention leading to self-care maintenance.      Core Components/Risk Factors/Patient Goals Review:    Core Components/Risk Factors/Patient Goals at Discharge (Final Review):    ITP Comments:     ITP Comments    Row Name 06/17/16 0826           ITP Comments Medical Director, Dr. Fransico Him          Comments: Latronda is off to a good start after completing 2 exercise sessions.Barnet Pall, RN,BSN 06/23/2016 5:04 PM

## 2016-06-23 NOTE — Progress Notes (Signed)
Daily Session Note  Patient Details  Name: [name] [name] [name]: [id number] Date of Birth: October 05, 1945 Referring Provider:     [place] from 06/17/2016 in [place]  Referring Provider  Loralie Champagne MD      Encounter Date: 06/23/2016  Check In:   Capillary Blood Glucose: Results for orders placed or performed during the hospital encounter of 06/23/16 (from the past 24 hour(s))  Glucose, capillary     Status: Abnormal   Collection Time: 06/23/16  9:57 AM  Result Value Ref Range   Glucose-Capillary 235 (H) 65 - 99 mg/dL   *Note: Due to a large number of results and/or encounters for the requested time period, some results have not been displayed. A complete set of results can be found in Results Review.      History  Smoking Status  . Never Smoker  Smokeless Tobacco  . Never Used    Goals Met:  Exercise tolerated well No report of cardiac concerns or symptoms  Goals Unmet:  Not Applicable  Comments: Pt started cardiac rehab today.  Pt tolerated light exercise without difficulty. [name]'s entry blood pressure was 162/80 the 124/ 80. Exit blood pressure was 136/79 upon exit from cardiac rehab telemetry-Sinus Rhythm with a bundle branch block, asymptomatic.  Medication list reconciled. Pt denies barriers to medicaiton compliance.  PSYCHOSOCIAL ASSESSMENT:  PHQ-1. Pt exhibits positive coping skills, hopeful outlook with supportive family. No psychosocial needs identified at this time, no psychosocial interventions necessary.    Pt enjoys reading and listening to music.   Pt oriented to exercise equipment and routine.    Understanding verbalized. [name] exercised on 2l/min of oxygen today per Dr [name]. [name] had no complaints of angina today with exercise.Will continue to monitor the patient throughout  the program.[name] Venetia Maxon, RN,BSN 06/23/2016 5:01 PM  Dr. Fransico Him is Medical Director for Cardiac Rehab at [facility].

## 2016-06-25 ENCOUNTER — Encounter (HOSPITAL_COMMUNITY)
Admission: RE | Admit: 2016-06-25 | Discharge: 2016-06-25 | Disposition: A | Discharge status: Home / Self Care | Payer: Medicare Other | Source: Ambulatory Visit | Attending: Cardiology | Admitting: Cardiology

## 2016-06-25 DIAGNOSIS — R0902 Hypoxemia: Secondary | ICD-10-CM | POA: Diagnosis not present

## 2016-06-25 DIAGNOSIS — I208 Other forms of angina pectoris: Secondary | ICD-10-CM

## 2016-06-25 LAB — GLUCOSE, CAPILLARY
Glucose-Capillary: 276 mg/dL — ABNORMAL HIGH (ref 65–99)
Glucose-Capillary: 210 mg/dL — ABNORMAL HIGH (ref 65–99)

## 2016-06-30 ENCOUNTER — Encounter (HOSPITAL_COMMUNITY)
Admission: RE | Admit: 2016-06-30 | Discharge: 2016-06-30 | Disposition: A | Discharge status: Home / Self Care | Payer: Medicare Other | Source: Ambulatory Visit | Attending: Cardiology | Admitting: Cardiology

## 2016-06-30 DIAGNOSIS — R0902 Hypoxemia: Secondary | ICD-10-CM | POA: Diagnosis not present

## 2016-06-30 DIAGNOSIS — I208 Other forms of angina pectoris: Secondary | ICD-10-CM

## 2016-06-30 LAB — GLUCOSE, CAPILLARY
Glucose-Capillary: 248 mg/dL — ABNORMAL HIGH (ref 65–99)
Glucose-Capillary: 151 mg/dL — ABNORMAL HIGH (ref 65–99)

## 2016-06-30 NOTE — Progress Notes (Signed)
[name] [name] reported that she had two episodes over the weekend where she was out at restaurants and felt hot and shaky. The first episode was on Friday before eating lunch the second episode was on Sunday after eating with her husband. [name] [name] denied having any chest pain or discomfort with this episode. Dr [employer] office called and notified about today's event. [name] [name] had no complaints or symptoms with exercise today.Will continue to monitor the patient throughout  the program.

## 2016-07-02 ENCOUNTER — Encounter (HOSPITAL_COMMUNITY)
Admission: RE | Admit: 2016-07-02 | Discharge: 2016-07-02 | Disposition: A | Discharge status: Home / Self Care | Payer: Medicare Other | Source: Ambulatory Visit | Attending: Cardiology | Admitting: Cardiology

## 2016-07-02 DIAGNOSIS — R0902 Hypoxemia: Secondary | ICD-10-CM | POA: Diagnosis present

## 2016-07-02 DIAGNOSIS — I208 Other forms of angina pectoris: Secondary | ICD-10-CM

## 2016-07-02 DIAGNOSIS — I2089 Other forms of angina pectoris: Secondary | ICD-10-CM

## 2016-07-02 DIAGNOSIS — G4734 Idiopathic sleep related nonobstructive alveolar hypoventilation: Secondary | ICD-10-CM | POA: Insufficient documentation

## 2016-07-02 LAB — GLUCOSE, CAPILLARY: Glucose-Capillary: 200 mg/dL — ABNORMAL HIGH (ref 65–99)

## 2016-07-05 ENCOUNTER — Encounter (HOSPITAL_COMMUNITY)
Admission: RE | Admit: 2016-07-05 | Discharge: 2016-07-05 | Disposition: A | Discharge status: Home / Self Care | Payer: Medicare Other | Source: Ambulatory Visit | Attending: Cardiology | Admitting: Cardiology

## 2016-07-05 DIAGNOSIS — R0902 Hypoxemia: Secondary | ICD-10-CM | POA: Diagnosis not present

## 2016-07-05 DIAGNOSIS — I208 Other forms of angina pectoris: Secondary | ICD-10-CM

## 2016-07-05 NOTE — Progress Notes (Signed)
Quality of life questionnaire reviewed with [name] [name]. [name] [name] scored low in the health and functioning and psychosocial spiritual aspects of her quality of life questionnaire. [name] [name] has a history of depression and is taking an antidepressant. Appointment made for [name] [name] to see [facility] hospital chaplain on Wednesday. Will forward [name] [name]'s quality of life questionnaire to Dr Claris Gladden office for review.Barnet Pall, RN,BSN 07/05/2016 6:06 PM

## 2016-07-07 ENCOUNTER — Encounter (HOSPITAL_COMMUNITY)
Admission: RE | Admit: 2016-07-07 | Discharge: 2016-07-07 | Disposition: A | Discharge status: Home / Self Care | Payer: Medicare Other | Source: Ambulatory Visit | Attending: Cardiology | Admitting: Cardiology

## 2016-07-07 DIAGNOSIS — I208 Other forms of angina pectoris: Secondary | ICD-10-CM

## 2016-07-07 DIAGNOSIS — I2089 Other forms of angina pectoris: Secondary | ICD-10-CM

## 2016-07-07 DIAGNOSIS — R0902 Hypoxemia: Secondary | ICD-10-CM | POA: Diagnosis not present

## 2016-07-07 NOTE — Progress Notes (Signed)
[name] [name] 71 y.o. female Nutrition Note Spoke with pt. Pt well-known to me from previous admission. Nutrition Plan and Nutrition Survey goals reviewed with pt. Pt is following Step 2 of the Therapeutic Lifestyle Changes diet. Pt states she has frequent N/V due to sequelae after lap band procedure. Pt wants to lose wt. Wt loss tips reviewed. Pt is diabetic. No recent A1c noted. Pt reports her last A1c was 7.1. This Probation officer went over Diabetes Education test results. Pt checks CBG's 4 times a day. Fasting CBG's reportedly have been in the 300's mg/dL. Pt is concerned re: recently elevated fasting CBG's. Pt with dx of CHF. Per discussion, pt does not use canned/convenience foods often. If pt uses canned food, she uses low sodium varieties and rinses them well. Pt expressed understanding of the information reviewed. Pt aware of nutrition education classes offered and has previously received nutrition class information.   Wt Readings from Last 3 Encounters:  06/17/16 (!) 302 lb 11.1 oz (137.3 kg)  06/02/16 297 lb (134.7 kg)  05/25/16 (!) 301 lb 9.6 oz (136.8 kg)    Nutrition Diagnosis ? Food-and nutrition-related knowledge deficit related to lack of exposure to information as related to diagnosis of: ? CVD ? DM ? Obesity related to excessive energy intake as evidenced by a BMI of 55  Nutrition Intervention ? Pt's individual nutrition plan reviewed with pt. ? Benefits of adopting Therapeutic Lifestyle Changes discussed when Medficts reviewed. ? Pt to attend the Portion Distortion class ? Pt to attend the Diabetes Q & A class ? Pt given handouts for: ? Nutrition I class ? Nutrition II class ? Diabetes Blitz Class ? Pt to call Dr. Cindra Eves office re: elevated fasting CBG's  ? Continue client-centered nutrition education by RD, as part of interdisciplinary care. Goal(s) ? Pt to identify food quantities necessary to achieve weight loss of 6-24 lb (2.7-10.9 kg) at graduation from cardiac rehab.  ? CBG  concentrations in the normal range or as close to normal as is safely possible. Monitor and Evaluate progress toward nutrition goal with team. Derek Mound, M.Ed, RD, LDN, CDE 07/07/2016 10:45 AM

## 2016-07-08 ENCOUNTER — Other Ambulatory Visit (HOSPITAL_COMMUNITY): Payer: Self-pay | Admitting: *Deleted

## 2016-07-08 ENCOUNTER — Telehealth (HOSPITAL_COMMUNITY): Payer: Self-pay | Admitting: *Deleted

## 2016-07-08 ENCOUNTER — Encounter (HOSPITAL_COMMUNITY): Payer: Self-pay | Admitting: Cardiology

## 2016-07-08 ENCOUNTER — Ambulatory Visit (HOSPITAL_COMMUNITY)
Admission: RE | Admit: 2016-07-08 | Discharge: 2016-07-08 | Disposition: A | Discharge status: Home / Self Care | Payer: Medicare Other | Source: Ambulatory Visit | Attending: Internal Medicine | Admitting: Internal Medicine

## 2016-07-08 VITALS — BP 172/76 | HR 73 | Wt 298.8 lb

## 2016-07-08 DIAGNOSIS — K219 Gastro-esophageal reflux disease without esophagitis: Secondary | ICD-10-CM | POA: Diagnosis not present

## 2016-07-08 DIAGNOSIS — Z79899 Other long term (current) drug therapy: Secondary | ICD-10-CM | POA: Insufficient documentation

## 2016-07-08 DIAGNOSIS — Z85820 Personal history of malignant melanoma of skin: Secondary | ICD-10-CM | POA: Diagnosis not present

## 2016-07-08 DIAGNOSIS — N189 Chronic kidney disease, unspecified: Secondary | ICD-10-CM | POA: Diagnosis not present

## 2016-07-08 DIAGNOSIS — I5032 Chronic diastolic (congestive) heart failure: Secondary | ICD-10-CM

## 2016-07-08 DIAGNOSIS — E784 Other hyperlipidemia: Secondary | ICD-10-CM

## 2016-07-08 DIAGNOSIS — I25111 Atherosclerotic heart disease of native coronary artery with angina pectoris with documented spasm: Secondary | ICD-10-CM

## 2016-07-08 DIAGNOSIS — K449 Diaphragmatic hernia without obstruction or gangrene: Secondary | ICD-10-CM | POA: Diagnosis not present

## 2016-07-08 DIAGNOSIS — R42 Dizziness and giddiness: Secondary | ICD-10-CM | POA: Insufficient documentation

## 2016-07-08 DIAGNOSIS — Z8249 Family history of ischemic heart disease and other diseases of the circulatory system: Secondary | ICD-10-CM | POA: Insufficient documentation

## 2016-07-08 DIAGNOSIS — R079 Chest pain, unspecified: Secondary | ICD-10-CM

## 2016-07-08 DIAGNOSIS — I6522 Occlusion and stenosis of left carotid artery: Secondary | ICD-10-CM

## 2016-07-08 DIAGNOSIS — I130 Hypertensive heart and chronic kidney disease with heart failure and stage 1 through stage 4 chronic kidney disease, or unspecified chronic kidney disease: Secondary | ICD-10-CM | POA: Diagnosis not present

## 2016-07-08 DIAGNOSIS — Z955 Presence of coronary angioplasty implant and graft: Secondary | ICD-10-CM | POA: Insufficient documentation

## 2016-07-08 DIAGNOSIS — I252 Old myocardial infarction: Secondary | ICD-10-CM | POA: Insufficient documentation

## 2016-07-08 DIAGNOSIS — Z6841 Body Mass Index (BMI) 40.0 and over, adult: Secondary | ICD-10-CM

## 2016-07-08 DIAGNOSIS — I10 Essential (primary) hypertension: Secondary | ICD-10-CM

## 2016-07-08 DIAGNOSIS — Z959 Presence of cardiac and vascular implant and graft, unspecified: Secondary | ICD-10-CM | POA: Diagnosis not present

## 2016-07-08 DIAGNOSIS — E1122 Type 2 diabetes mellitus with diabetic chronic kidney disease: Secondary | ICD-10-CM | POA: Diagnosis not present

## 2016-07-08 DIAGNOSIS — I2510 Atherosclerotic heart disease of native coronary artery without angina pectoris: Secondary | ICD-10-CM | POA: Diagnosis present

## 2016-07-08 DIAGNOSIS — R002 Palpitations: Secondary | ICD-10-CM | POA: Diagnosis not present

## 2016-07-08 DIAGNOSIS — G4733 Obstructive sleep apnea (adult) (pediatric): Secondary | ICD-10-CM | POA: Diagnosis not present

## 2016-07-08 DIAGNOSIS — Z7902 Long term (current) use of antithrombotics/antiplatelets: Secondary | ICD-10-CM | POA: Insufficient documentation

## 2016-07-08 DIAGNOSIS — Z794 Long term (current) use of insulin: Secondary | ICD-10-CM | POA: Insufficient documentation

## 2016-07-08 DIAGNOSIS — Z8673 Personal history of transient ischemic attack (TIA), and cerebral infarction without residual deficits: Secondary | ICD-10-CM | POA: Insufficient documentation

## 2016-07-08 DIAGNOSIS — Z9889 Other specified postprocedural states: Secondary | ICD-10-CM | POA: Insufficient documentation

## 2016-07-08 DIAGNOSIS — Z7982 Long term (current) use of aspirin: Secondary | ICD-10-CM | POA: Diagnosis not present

## 2016-07-08 DIAGNOSIS — E7849 Other hyperlipidemia: Secondary | ICD-10-CM

## 2016-07-08 DIAGNOSIS — E1143 Type 2 diabetes mellitus with diabetic autonomic (poly)neuropathy: Secondary | ICD-10-CM | POA: Diagnosis not present

## 2016-07-08 DIAGNOSIS — Z951 Presence of aortocoronary bypass graft: Secondary | ICD-10-CM | POA: Insufficient documentation

## 2016-07-08 DIAGNOSIS — F419 Anxiety disorder, unspecified: Secondary | ICD-10-CM | POA: Insufficient documentation

## 2016-07-08 DIAGNOSIS — I350 Nonrheumatic aortic (valve) stenosis: Secondary | ICD-10-CM | POA: Diagnosis not present

## 2016-07-08 DIAGNOSIS — E785 Hyperlipidemia, unspecified: Secondary | ICD-10-CM | POA: Insufficient documentation

## 2016-07-08 LAB — CBC
HCT: 43.8 % (ref 36.0–46.0)
Hemoglobin: 14.6 g/dL (ref 12.0–15.0)
MCH: 33 pg (ref 26.0–34.0)
MCHC: 33.3 g/dL (ref 30.0–36.0)
MCV: 99.1 fL (ref 78.0–100.0)
Platelets: 182 10*3/uL (ref 150–400)
RBC: 4.42 MIL/uL (ref 3.87–5.11)
RDW: 12.4 % (ref 11.5–15.5)
WBC: 8.4 10*3/uL (ref 4.0–10.5)

## 2016-07-08 LAB — BASIC METABOLIC PANEL
Anion gap: 6 (ref 5–15)
BUN: 18 mg/dL (ref 6–20)
CO2: 30 mmol/L (ref 22–32)
Calcium: 9.4 mg/dL (ref 8.9–10.3)
Chloride: 103 mmol/L (ref 101–111)
Creatinine, Ser: 1.1 mg/dL — ABNORMAL HIGH (ref 0.44–1.00)
GFR calc Af Amer: 58 mL/min — ABNORMAL LOW (ref >60)
GFR calc non Af Amer: 50 mL/min — ABNORMAL LOW (ref >60)
Glucose, Bld: 151 mg/dL — ABNORMAL HIGH (ref 65–99)
Potassium: 4.8 mmol/L (ref 3.5–5.1)
Sodium: 139 mmol/L (ref 135–145)

## 2016-07-08 LAB — PROTIME-INR
INR: 0.97
Prothrombin Time: 12.8 seconds (ref 11.4–15.2)

## 2016-07-08 NOTE — Progress Notes (Signed)
Patient ID: ALOHILANI [name], female   DOB: 1945-12-12, 71 y.o.   [name]: [id number] PCP: Dr. Sandi Mariscal Cardiology: Dr. [name]  71 yo with history of CAD s/p CABG, diastolic CHF, and cerebrovascular disease with history of CVA presents for cardiology followup. She had CABG x 5 in 12/11.  Prior to the CABG she had multiple PCIs.  She had a CVA in 2/14 that presented as visual loss.  She had a left carotid stent in 2/15.   Left heart cath done in Sept. 2014. This showed patent SVG-D, LIMA-LAD, and sequential SVG-OM branches but SVG-RCA and the native RCA were both totally occluded. It was felt aht her increased symptoms coincided with occlusion of SVG-RCA.  She was started on Imdur to see if this would help with the chest pain and dyspnea.  However, she feels like Imdur causes leg cramps and does not think that she can take it.  So ranolazine 500 mg bid started and titrated this up to 1000 mg bid.  This helped some but not markedly.  Therefore, sent to  Dr. Irish Lack to address opening RCA CTO. He was able to do this in 5/15 with 4 overlapping Xience DES.  This led to resolution of exertional chest pain.    Mrs Bottcher was started on Brilinta after PCI.  She became much more short of breath after starting on Brilinta. Per Dr Delano Metz stopped and replaced with Plavix.  Dyspnea improved off Brilinta. Given increasing exertional dyspnea and chest pain,  RHC/LHC in 4/17.  This showed stable CAD with no interventional target.  Left and right heart filling pressures were not significantly increased.  Medical management.   She presents today for HF follow up. She is participating in cardiac rehab, does not feel SOB with exercising, but has to stop frequently due to fatigue. She says that she was shopping about a week ago and walking around the store when she developed acute onset interscapular pain with diaphoresis and SOB. She sat down in the store and felt better. When she got up, pain started again and she  went to her car and sat with the air conditioning on and felt better. She has had about 3 episodes similar in the past 6 weeks. Of note, this is her anginal equivalent. Also of note, she has had progression of her cerebral vascular disease. Had an MRI in May 2018 that showed Moderately severe stenosis of the supraclinoid segment of the right internal carotid artery, progressed since the 2016 MR angiogram and moderate stenosis of the cavernous, progressed compared to the 2016 MRA, and milder stenosis of the supraclinoid segments of the left internal carotid artery. She denies SOB currently, no chest pain or interscapular pain now. Denies SOB and palpitations.   Labs (6/14): K 3.8, creatinine 0.71 Labs (8/14): BNP 249, LDL 166 Labs (9/14): K 4.7, creatinine 0.7 Labs (9/14): K 4.6, creatinine 0.9, BNP 109 Labs (1/15): K 4.5, creatinine 0.73, LDL 212, HCT 43.4, TSH normal, BNP 138 Labs (6/15): K 4.7, creatinine 1.17, LDL 100, HDL 37, TGs 363, BNP 39 Labs (10/15): K 4.6, creatinine 1.2 Labs (1/16): LDL 157, HDL 43, K 5.2, creatinine 0.95 Labs (2/16): K 4.5, creatinine 1.08, BNP 113 Labs (4/16): LDL 50, HDL 56, TGs 179 Labs (9/16): K 5.3, creatinine 1.09 Labs (10/16): LDL 75, HDL 56 Labs (2/17): K 5.2, creatinine 1.07 Labs (4/17): K 5.1, creatinine 1.07 Labs (5/17): K 4.5, creatinine 1.01, LDL 96, HDL 56, BNP 70 Labs (03/2016)  K 4.9, creatinine 1.07     PMH: 1. Diabetic gastroparesis 2. Type II diabetes 3. HTN 4. Morbid obesity 5. CAD: s/p CABG in 12/11 after prior PCIs.  LIMA-LAD, SVG-D, seq SVG-OM1 and OM2, SVG-PDA. Adenosine Cardiolite (8/14) with EF 53% and a small reversible apical defect with a medium, partially reversible inferior defect.  LHC (9/14) with patent SVG-D, LIMA-LAD (50% distal LAD), and sequential SVG-OM branches; the SVG-RCA and the native RCA were occluded.  This was managed medically initially, but with ongoing exertional chest pain, it was decided to open CTO.  Patient had  CTO opening with 4 overlapping Xience DES in the RCA in 5/15.   - LHC (4/17): SVG-D patent, LIMA-LAD patent, distal LAD with several 40-50% stenoses, sequential SVG-OM1 and PLOM patent with 50% proximal stenosis (not flow limiting), SVG-RCA TO with patent RCA stents.  6. Atypical migraines 7. OSA: Intolerant of CPAP.  8. GERD with hiatal hernia 9. OA 10. Diastolic CHF: Echo (93/73) with EF 50-55%, grade II diastolic dysfunction, mild-moderate MR.  Echo (8/14) with EF 55-60%, grade II diastolic dysfunction, mildly increased aortic valve gradient (mean 12 mmHg) but valve opens well, mild MR and mild RV dilation.  Echo (9/15) with EF 60-65%, grade II diastolic dysfunction, mild aortic stenosis, mild mitral stenosis, mild to moderate mitral regurgitation, mildly dilated RV with normal systolic function, PA systolic pressure 46 mmHg.  - RHC (4/17): mean RA 9, PA 38/15 mean 27, mean PCWP 9, CI 2.5.  - Echo (5/17): EF 55-60%, mild LVH, mildly dilated RV with low normal systolic function, moderate TR, PASP 61 mmHg 11. CKD 12. Chronic LBBB 13. Anxiety 14. Carotid stenosis: Followed by [facility], >42% LICA stenosis 87/68.  She had left carotid stent in 2/15. Carotid dopplers 8/15 with no significant disease. Carotid dopplers (4/16): < 40% RICA stenosis.  15. Cerebrovascular disease: CVA 2/14 with right posterior cerebral artery territory ischemic infarction. Cerebral angiogram in 6/14 showed 70% right vertebral ostial stenosis, 11-57% LICA stenosis, > 26% proximal left posterior cerebral artery stenosis, posterior communicating artery aneurysm.  Patient has had episodes of transient expressive aphasia.  Carotid dopplers (20/35) showed >59% LICA stenosis. She had a left carotid stent 2/15.  Possible CVA in 7/15.  16. Positional vertigo (suspected) 17. Palpitations: Holter (6/15) with rare PVCs/PACs.  18. Dyspnea with Brilinta. 19. Melanoma: On face, s/p excision.   20. Aortic stenosis: Mild.  21. Holter (8/16)  with no significant arrhythmias.  22. Lower extremity arterial dopplers (9/16) were normal.   SH: Married, nonsmoker  FH: CAD  ROS: All systems reviewed and negative except as per HPI.   Current Outpatient Prescriptions  Medication Sig Dispense Refill  . acetaminophen (TYLENOL) 325 MG tablet Take 650 mg by mouth every 6 (six) hours as needed for mild pain.     Marland Kitchen albuterol (PROVENTIL) (2.5 MG/3ML) 0.083% nebulizer solution Take 2.5 mg by nebulization every 6 (six) hours as needed for shortness of breath (when asthma is active).     Marland Kitchen albuterol (PROVENTIL,VENTOLIN) 90 MCG/ACT inhaler Inhale 2 puffs into the lungs every 6 (six) hours as needed for shortness of breath.     . Alirocumab (PRALUENT) 150 MG/ML SOPN Inject 1 pen into the skin every 14 (fourteen) days. 6 pen 3  . ALPRAZolam (XANAX) 0.5 MG tablet Take 0.5-1 mg by mouth 3 (three) times daily. 0.5mg  in the morning and at lunch, 1mg  at bedtime    . amLODipine (NORVASC) 10 MG tablet Take 10 mg by mouth daily.    Marland Kitchen  aspirin EC 81 MG tablet Take 162 mg by mouth daily.    Marland Kitchen b complex vitamins capsule Take 1 capsule by mouth daily.    Marland Kitchen buPROPion (WELLBUTRIN XL) 150 MG 24 hr tablet Take 1 tablet by mouth daily.    . Calcium Carbonate 1500 (600 CA) MG TABS Take 1,500 mg by mouth daily with breakfast.    . Cholecalciferol (VITAMIN D) 2000 UNITS tablet Take 2,000 Units by mouth daily with breakfast.     . cloNIDine (CATAPRES) 0.2 MG tablet Take 0.2 mg by mouth 2 (two) times daily.    . clopidogrel (PLAVIX) 75 MG tablet TAKE ONE (1) TABLET BY MOUTH EVERY DAY 90 tablet 3  . denosumab (PROLIA) 60 MG/ML SOLN injection Inject 60 mg into the skin every 6 (six) months. Administer in upper arm, thigh, or abdomen    . DULoxetine (CYMBALTA) 60 MG capsule Take 30 mg by mouth daily with breakfast.     . Fluticasone-Salmeterol (ADVAIR DISKUS) 250-50 MCG/DOSE AEPB Inhale 1 puff into the lungs every 12 (twelve) hours.     . gabapentin (NEURONTIN) 600 MG tablet  Take 600-750 mg by mouth 3 (three) times daily. 600mg  in the morning and at noon. 750mg  at bedtime.    Marland Kitchen HUMALOG KWIKPEN 100 UNIT/ML SOPN Inject 18-26 Units into the skin 4 (four) times daily -  with meals and at bedtime. 10 units at every meal and 14 units at bedtime if needed    . HYDROcodone-acetaminophen (NORCO/VICODIN) 5-325 MG per tablet Take 1 tablet by mouth at bedtime as needed for moderate pain.     Marland Kitchen ipratropium-albuterol (DUONEB) 0.5-2.5 (3) MG/3ML SOLN Take 3 mLs by nebulization as needed (If severe asthma , use every 4 hours for 10 days).     Marland Kitchen LANTUS SOLOSTAR 100 UNIT/ML Solostar Pen Inject 48-52 Units into the skin every morning.     . loperamide (IMODIUM) 2 MG capsule Take 2 mg by mouth as needed for diarrhea or loose stools.    Marland Kitchen losartan (COZAAR) 100 MG tablet Take 100 mg by mouth daily with breakfast.     . magnesium oxide (MAG-OX) 400 MG tablet Take 400 mg by mouth 2 (two) times daily.    . meclizine (ANTIVERT) 12.5 MG tablet Take 12.5 mg by mouth 3 (three) times daily as needed for dizziness.    . methocarbamol (ROBAXIN) 500 MG tablet TAKE ONE (1) TABLET THREE (3) TIMES EACH DAY 60 tablet 1  . metoprolol succinate (TOPROL-XL) 25 MG 24 hr tablet TAKE ONE TABLET TWICE DAILY 180 tablet 3  . Multiple Vitamin (MULTIVITAMIN) capsule Take 1 capsule by mouth daily.    . multivitamin-lutein (OCUVITE-LUTEIN) CAPS capsule Take 1 capsule by mouth daily.    . nitroGLYCERIN (NITROSTAT) 0.3 MG SL tablet Place 1 tablet (0.3 mg total) under the tongue every 5 (five) minutes x 3 doses as needed for chest pain. 90 tablet 1  . ondansetron (ZOFRAN) 4 MG tablet Take 1 tablet (4 mg total) by mouth 2 (two) times daily as needed. For nausea (Patient taking differently: Take 4 mg by mouth 2 (two) times daily. For nausea) 20 tablet 0  . pantoprazole (PROTONIX) 40 MG tablet Take 1 tablet (40 mg total) by mouth 2 (two) times daily. 180 tablet 0  . RANEXA 1000 MG SR tablet TAKE ONE (1) TABLET BY MOUTH TWO  (2) TIMES DAILY 60 each 6  . rosuvastatin (CRESTOR) 40 MG tablet TAKE ONE (1) TABLET EACH DAY 90 tablet 6  .  torsemide (DEMADEX) 20 MG tablet Take 4 tablets (80 mg total) by mouth daily. 120 tablet 3  . triamterene-hydrochlorothiazide (MAXZIDE-25) 37.5-25 MG tablet Take 1 tablet by mouth daily.    . vitamin B-12 (CYANOCOBALAMIN) 1000 MCG tablet Take 1,000 mcg by mouth daily.    Marland Kitchen zinc gluconate 50 MG tablet Take 50 mg by mouth at bedtime.     Marland Kitchen zolpidem (AMBIEN) 10 MG tablet Take 10 mg by mouth at bedtime as needed for sleep.     Marland Kitchen ezetimibe (ZETIA) 10 MG tablet Take 1 tablet (10 mg total) by mouth daily. 30 tablet 11   No current facility-administered medications for this encounter.     BP (!) 172/76 (BP Location: Left Arm, Patient Position: Sitting, Cuff Size: Large)   Pulse 73   Wt 298 lb 12.8 oz (135.5 kg)   SpO2 95%   BMI 54.21 kg/m  PHYSICAL EXAM General: Obese female, SOB with walking into clinic  HEENT: normal Neck: supple. JVP 8cm. Carotids 2+ bilat; no bruits. No thyromegaly or nodule noted. Cor: PMI nondisplaced. RRR, No M/G/R noted Lungs: Bilateral upper wheezing, diminished in bilateral lower lobes.  Abdomen: soft, non-tender, distended, no HSM. No bruits or masses. + bowel sounds.  Extremities: no cyanosis, clubbing, rash, warm. 1-2+ pedal edema.  Neuro: alert & orientedx3, cranial nerves grossly intact. moves all 4 extremities w/o difficulty. Affect pleasant   Assessment/Plan: 1. CAD: Occluded native RCA and SVG-RCA.  Now status post opening of CTO RCA with 4 overlapping Xience DES.  Most recent cath in 4/17 showed patent RCA stents, no interventional target.  - Continue ASA + Plavix.    - Has not tolerated Imdur in the past.  - Now with symptoms concerning for angina, recently developed interscapular pain and diaphoresis which is reminiscent of her prior MI's. Spoke with Dr. [name], will proceed with cath tomorrow to evaluate anatomy.  - Will draw all pre cath labs.   - EKG non ischemic today.  2. Chronic diastolic CHF:   - NYHA II-III  - Volume status stable on exam - Tolerating cardiac rehab, she has gone a couple of times and does a minute or 2 of activity and then rests.  - Continue torsemide 80mg  daily.  3. Hyperlipidemia: - Followed at the lipid clinic - Continue Praluent  - LDL 97.  4. Hypertension:  - Well controlled on current regimen.    5. Cerebrovascular disease: She has history of CVA and had left carotid stent in 2/15. -  Followed by [facility]  - Recent MRI with worsening R carotid disease - She has follow up with [facility] in July.  6. Palpitations: Denies palpitations today.  - Continue Toprol XL   7. Suspected sleep apnea. - Sleep study done 05/2016 - No significant OSA, no central sleep apnea.  Will set up for cath as above. Follow up in one month.     Arbutus Leas NP-C  07/08/2016

## 2016-07-08 NOTE — Progress Notes (Signed)
x

## 2016-07-08 NOTE — Patient Instructions (Addendum)
Your physician has requested that you have a cardiac catheterization. Cardiac catheterization is used to diagnose and/or treat various heart conditions. Doctors may recommend this procedure for a number of different reasons. The most common reason is to evaluate chest pain. Chest pain can be a symptom of coronary artery disease (CAD), and cardiac catheterization can show whether plaque is narrowing or blocking your heart's arteries. This procedure is also used to evaluate the valves, as well as measure the blood flow and oxygen levels in different parts of your heart. For further information please visit [email or web address]. Please follow instruction sheet, as given.  Labs today We will only contact you if something comes back abnormal or we need to make some changes. Otherwise no news is good news!   Your physician recommends that you schedule a follow-up appointment in: 1 month

## 2016-07-09 ENCOUNTER — Encounter (HOSPITAL_COMMUNITY)
Admission: RE | Disposition: A | Discharge status: Home / Self Care | Payer: Self-pay | Source: Ambulatory Visit | Attending: Cardiology

## 2016-07-09 ENCOUNTER — Ambulatory Visit (HOSPITAL_COMMUNITY)
Admission: RE | Admit: 2016-07-09 | Discharge: 2016-07-09 | Disposition: A | Discharge status: Home / Self Care | Payer: Medicare Other | Source: Ambulatory Visit | Attending: Cardiology | Admitting: Cardiology

## 2016-07-09 ENCOUNTER — Encounter (HOSPITAL_COMMUNITY): Admission: RE | Admit: 2016-07-09 | Payer: Medicare Other | Source: Ambulatory Visit

## 2016-07-09 DIAGNOSIS — G4733 Obstructive sleep apnea (adult) (pediatric): Secondary | ICD-10-CM | POA: Insufficient documentation

## 2016-07-09 DIAGNOSIS — Z8249 Family history of ischemic heart disease and other diseases of the circulatory system: Secondary | ICD-10-CM | POA: Diagnosis not present

## 2016-07-09 DIAGNOSIS — E1143 Type 2 diabetes mellitus with diabetic autonomic (poly)neuropathy: Secondary | ICD-10-CM | POA: Diagnosis not present

## 2016-07-09 DIAGNOSIS — Z6841 Body Mass Index (BMI) 40.0 and over, adult: Secondary | ICD-10-CM | POA: Diagnosis not present

## 2016-07-09 DIAGNOSIS — I6523 Occlusion and stenosis of bilateral carotid arteries: Secondary | ICD-10-CM | POA: Diagnosis not present

## 2016-07-09 DIAGNOSIS — Z955 Presence of coronary angioplasty implant and graft: Secondary | ICD-10-CM | POA: Diagnosis not present

## 2016-07-09 DIAGNOSIS — N189 Chronic kidney disease, unspecified: Secondary | ICD-10-CM | POA: Diagnosis not present

## 2016-07-09 DIAGNOSIS — Z794 Long term (current) use of insulin: Secondary | ICD-10-CM | POA: Insufficient documentation

## 2016-07-09 DIAGNOSIS — I130 Hypertensive heart and chronic kidney disease with heart failure and stage 1 through stage 4 chronic kidney disease, or unspecified chronic kidney disease: Secondary | ICD-10-CM | POA: Diagnosis not present

## 2016-07-09 DIAGNOSIS — K3184 Gastroparesis: Secondary | ICD-10-CM | POA: Diagnosis not present

## 2016-07-09 DIAGNOSIS — I350 Nonrheumatic aortic (valve) stenosis: Secondary | ICD-10-CM | POA: Insufficient documentation

## 2016-07-09 DIAGNOSIS — I252 Old myocardial infarction: Secondary | ICD-10-CM | POA: Insufficient documentation

## 2016-07-09 DIAGNOSIS — M1990 Unspecified osteoarthritis, unspecified site: Secondary | ICD-10-CM | POA: Diagnosis not present

## 2016-07-09 DIAGNOSIS — I25810 Atherosclerosis of coronary artery bypass graft(s) without angina pectoris: Secondary | ICD-10-CM | POA: Diagnosis not present

## 2016-07-09 DIAGNOSIS — I447 Left bundle-branch block, unspecified: Secondary | ICD-10-CM | POA: Insufficient documentation

## 2016-07-09 DIAGNOSIS — Z8673 Personal history of transient ischemic attack (TIA), and cerebral infarction without residual deficits: Secondary | ICD-10-CM | POA: Diagnosis not present

## 2016-07-09 DIAGNOSIS — E1122 Type 2 diabetes mellitus with diabetic chronic kidney disease: Secondary | ICD-10-CM | POA: Diagnosis not present

## 2016-07-09 DIAGNOSIS — K219 Gastro-esophageal reflux disease without esophagitis: Secondary | ICD-10-CM | POA: Diagnosis not present

## 2016-07-09 DIAGNOSIS — I5032 Chronic diastolic (congestive) heart failure: Secondary | ICD-10-CM | POA: Insufficient documentation

## 2016-07-09 DIAGNOSIS — E785 Hyperlipidemia, unspecified: Secondary | ICD-10-CM | POA: Diagnosis not present

## 2016-07-09 DIAGNOSIS — Z7951 Long term (current) use of inhaled steroids: Secondary | ICD-10-CM | POA: Insufficient documentation

## 2016-07-09 DIAGNOSIS — R079 Chest pain, unspecified: Secondary | ICD-10-CM | POA: Insufficient documentation

## 2016-07-09 DIAGNOSIS — K449 Diaphragmatic hernia without obstruction or gangrene: Secondary | ICD-10-CM | POA: Insufficient documentation

## 2016-07-09 DIAGNOSIS — I2582 Chronic total occlusion of coronary artery: Secondary | ICD-10-CM | POA: Insufficient documentation

## 2016-07-09 DIAGNOSIS — Z7902 Long term (current) use of antithrombotics/antiplatelets: Secondary | ICD-10-CM | POA: Insufficient documentation

## 2016-07-09 DIAGNOSIS — F419 Anxiety disorder, unspecified: Secondary | ICD-10-CM | POA: Diagnosis not present

## 2016-07-09 DIAGNOSIS — Z7982 Long term (current) use of aspirin: Secondary | ICD-10-CM | POA: Insufficient documentation

## 2016-07-09 DIAGNOSIS — I2510 Atherosclerotic heart disease of native coronary artery without angina pectoris: Secondary | ICD-10-CM | POA: Diagnosis present

## 2016-07-09 HISTORY — PX: LEFT HEART CATH AND CORS/GRAFTS ANGIOGRAPHY: CATH118250

## 2016-07-09 LAB — GLUCOSE, CAPILLARY
Glucose-Capillary: 256 mg/dL — ABNORMAL HIGH (ref 65–99)
Glucose-Capillary: 98 mg/dL (ref 65–99)

## 2016-07-09 SURGERY — LEFT HEART CATH AND CORS/GRAFTS ANGIOGRAPHY
Anesthesia: LOCAL

## 2016-07-09 MED ORDER — SODIUM CHLORIDE 0.9 % IV SOLN: 250.0000 mL | INTRAVENOUS | Status: DC | PRN

## 2016-07-09 MED ORDER — ASPIRIN 81 MG PO CHEW: 81.0000 mg | CHEWABLE_TABLET | ORAL | Status: DC

## 2016-07-09 MED ORDER — SODIUM CHLORIDE 0.9 % IV SOLN: INTRAVENOUS | Status: AC

## 2016-07-09 MED ORDER — LIDOCAINE HCL (PF) 1 % IJ SOLN
INTRAMUSCULAR | Status: DC | PRN
  Administered 2016-07-09: 20 mL

## 2016-07-09 MED ORDER — HEPARIN (PORCINE) IN NACL 2-0.9 UNIT/ML-% IJ SOLN
INTRAMUSCULAR | Status: AC | PRN
  Administered 2016-07-09: 1000 mL via INTRA_ARTERIAL

## 2016-07-09 MED ORDER — LIDOCAINE HCL 1 % IJ SOLN
INTRAMUSCULAR | Status: AC
  Filled 2016-07-09: qty 20

## 2016-07-09 MED ORDER — IOPAMIDOL (ISOVUE-370) INJECTION 76%
INTRAVENOUS | Status: DC | PRN
  Administered 2016-07-09: 70 mL via INTRA_ARTERIAL

## 2016-07-09 MED ORDER — FENTANYL CITRATE (PF) 100 MCG/2ML IJ SOLN
INTRAMUSCULAR | Status: DC | PRN
  Administered 2016-07-09 (×2): 25 ug via INTRAVENOUS

## 2016-07-09 MED ORDER — FENTANYL CITRATE (PF) 100 MCG/2ML IJ SOLN
INTRAMUSCULAR | Status: AC
  Filled 2016-07-09: qty 2

## 2016-07-09 MED ORDER — ACETAMINOPHEN 325 MG PO TABS: 650.0000 mg | ORAL_TABLET | ORAL | Status: DC | PRN

## 2016-07-09 MED ORDER — HEPARIN (PORCINE) IN NACL 2-0.9 UNIT/ML-% IJ SOLN
INTRAMUSCULAR | Status: AC
  Filled 2016-07-09: qty 1000

## 2016-07-09 MED ORDER — SODIUM CHLORIDE 0.9% FLUSH: 3.0000 mL | INTRAVENOUS | Status: DC | PRN

## 2016-07-09 MED ORDER — SODIUM CHLORIDE 0.9 % IV SOLN
INTRAVENOUS | Status: DC
  Administered 2016-07-09: 12:00:00 via INTRAVENOUS

## 2016-07-09 MED ORDER — SODIUM CHLORIDE 0.9% FLUSH: 3.0000 mL | Freq: Two times a day (BID) | INTRAVENOUS | Status: DC

## 2016-07-09 MED ORDER — ONDANSETRON HCL 4 MG/2ML IJ SOLN: 4.0000 mg | Freq: Four times a day (QID) | INTRAMUSCULAR | Status: DC | PRN

## 2016-07-09 MED ORDER — MIDAZOLAM HCL 2 MG/2ML IJ SOLN
INTRAMUSCULAR | Status: AC
  Filled 2016-07-09: qty 2

## 2016-07-09 MED ORDER — MIDAZOLAM HCL 2 MG/2ML IJ SOLN
INTRAMUSCULAR | Status: DC | PRN
  Administered 2016-07-09: 1 mg via INTRAVENOUS
  Administered 2016-07-09: 0.5 mg via INTRAVENOUS

## 2016-07-09 MED ORDER — IOPAMIDOL (ISOVUE-370) INJECTION 76%
INTRAVENOUS | Status: AC
  Filled 2016-07-09: qty 100

## 2016-07-09 SURGICAL SUPPLY — 11 items
CATH INFINITI 5FR MULTPACK ANG (CATHETERS) ×2 IMPLANT
GLIDESHEATH SLEND SS 6F .021 (SHEATH) IMPLANT
GUIDEWIRE INQWIRE 1.5J.035X260 (WIRE) IMPLANT
INQWIRE 1.5J .035X260CM (WIRE)
KIT HEART LEFT (KITS) ×2 IMPLANT
PACK CARDIAC CATHETERIZATION (CUSTOM PROCEDURE TRAY) ×2 IMPLANT
SHEATH GLIDE SLENDER 4/5FR (SHEATH) IMPLANT
SHEATH PINNACLE 5F 10CM (SHEATH) ×2 IMPLANT
TRANSDUCER W/STOPCOCK (MISCELLANEOUS) ×2 IMPLANT
TUBING CIL FLEX 10 FLL-RA (TUBING) IMPLANT
WIRE EMERALD 3MM-J .035X150CM (WIRE) ×2 IMPLANT

## 2016-07-09 NOTE — H&P (View-Only) (Signed)
Patient ID: [name] [name], female   DOB: 31-Mar-1945, 71 y.o.   [name]: [id number] PCP: Dr. Sandi Mariscal Cardiology: Dr. [name]  71 yo with history of CAD s/p CABG, diastolic CHF, and cerebrovascular disease with history of CVA presents for cardiology followup. She had CABG x 5 in 12/11.  Prior to the CABG she had multiple PCIs.  She had a CVA in 2/14 that presented as visual loss.  She had a left carotid stent in 2/15.   Left heart cath done in Sept. 2014. This showed patent SVG-D, LIMA-LAD, and sequential SVG-OM branches but SVG-RCA and the native RCA were both totally occluded. It was felt aht her increased symptoms coincided with occlusion of SVG-RCA.  She was started on Imdur to see if this would help with the chest pain and dyspnea.  However, she feels like Imdur causes leg cramps and does not think that she can take it.  So ranolazine 500 mg bid started and titrated this up to 1000 mg bid.  This helped some but not markedly.  Therefore, sent to  Dr. Irish Lack to address opening RCA CTO. He was able to do this in 5/15 with 4 overlapping Xience DES.  This led to resolution of exertional chest pain.    [name] [name] was started on Brilinta after PCI.  She became much more short of breath after starting on Brilinta. Per Dr Delano Metz stopped and replaced with Plavix.  Dyspnea improved off Brilinta. Given increasing exertional dyspnea and chest pain,  RHC/LHC in 4/17.  This showed stable CAD with no interventional target.  Left and right heart filling pressures were not significantly increased.  Medical management.   She presents today for HF follow up. She is participating in cardiac rehab, does not feel SOB with exercising, but has to stop frequently due to fatigue. She says that she was shopping about a week ago and walking around the store when she developed acute onset interscapular pain with diaphoresis and SOB. She sat down in the store and felt better. When she got up, pain started again and she  went to her car and sat with the air conditioning on and felt better. She has had about 3 episodes similar in the past 6 weeks. Of note, this is her anginal equivalent. Also of note, she has had progression of her cerebral vascular disease. Had an MRI in May 2018 that showed Moderately severe stenosis of the supraclinoid segment of the right internal carotid artery, progressed since the 2016 MR angiogram and moderate stenosis of the cavernous, progressed compared to the 2016 MRA, and milder stenosis of the supraclinoid segments of the left internal carotid artery. She denies SOB currently, no chest pain or interscapular pain now. Denies SOB and palpitations.   Labs (6/14): K 3.8, creatinine 0.71 Labs (8/14): BNP 249, LDL 166 Labs (9/14): K 4.7, creatinine 0.7 Labs (9/14): K 4.6, creatinine 0.9, BNP 109 Labs (1/15): K 4.5, creatinine 0.73, LDL 212, HCT 43.4, TSH normal, BNP 138 Labs (6/15): K 4.7, creatinine 1.17, LDL 100, HDL 37, TGs 363, BNP 39 Labs (10/15): K 4.6, creatinine 1.2 Labs (1/16): LDL 157, HDL 43, K 5.2, creatinine 0.95 Labs (2/16): K 4.5, creatinine 1.08, BNP 113 Labs (4/16): LDL 50, HDL 56, TGs 179 Labs (9/16): K 5.3, creatinine 1.09 Labs (10/16): LDL 75, HDL 56 Labs (2/17): K 5.2, creatinine 1.07 Labs (4/17): K 5.1, creatinine 1.07 Labs (5/17): K 4.5, creatinine 1.01, LDL 96, HDL 56, BNP 70 Labs (03/2016)  K 4.9, creatinine 1.07     PMH: 1. Diabetic gastroparesis 2. Type II diabetes 3. HTN 4. Morbid obesity 5. CAD: s/p CABG in 12/11 after prior PCIs.  LIMA-LAD, SVG-D, seq SVG-OM1 and OM2, SVG-PDA. Adenosine Cardiolite (8/14) with EF 53% and a small reversible apical defect with a medium, partially reversible inferior defect.  LHC (9/14) with patent SVG-D, LIMA-LAD (50% distal LAD), and sequential SVG-OM branches; the SVG-RCA and the native RCA were occluded.  This was managed medically initially, but with ongoing exertional chest pain, it was decided to open CTO.  Patient had  CTO opening with 4 overlapping Xience DES in the RCA in 5/15.   - LHC (4/17): SVG-D patent, LIMA-LAD patent, distal LAD with several 40-50% stenoses, sequential SVG-OM1 and PLOM patent with 50% proximal stenosis (not flow limiting), SVG-RCA TO with patent RCA stents.  6. Atypical migraines 7. OSA: Intolerant of CPAP.  8. GERD with hiatal hernia 9. OA 10. Diastolic CHF: Echo (81/85) with EF 50-55%, grade II diastolic dysfunction, mild-moderate MR.  Echo (8/14) with EF 55-60%, grade II diastolic dysfunction, mildly increased aortic valve gradient (mean 12 mmHg) but valve opens well, mild MR and mild RV dilation.  Echo (9/15) with EF 60-65%, grade II diastolic dysfunction, mild aortic stenosis, mild mitral stenosis, mild to moderate mitral regurgitation, mildly dilated RV with normal systolic function, PA systolic pressure 46 mmHg.  - RHC (4/17): mean RA 9, PA 38/15 mean 27, mean PCWP 9, CI 2.5.  - Echo (5/17): EF 55-60%, mild LVH, mildly dilated RV with low normal systolic function, moderate TR, PASP 61 mmHg 11. CKD 12. Chronic LBBB 13. Anxiety 14. Carotid stenosis: Followed by [facility], >63% LICA stenosis 14/97.  She had left carotid stent in 2/15. Carotid dopplers 8/15 with no significant disease. Carotid dopplers (4/16): < 40% RICA stenosis.  15. Cerebrovascular disease: CVA 2/14 with right posterior cerebral artery territory ischemic infarction. Cerebral angiogram in 6/14 showed 70% right vertebral ostial stenosis, 02-63% LICA stenosis, > 78% proximal left posterior cerebral artery stenosis, posterior communicating artery aneurysm.  Patient has had episodes of transient expressive aphasia.  Carotid dopplers (58/85) showed >02% LICA stenosis. She had a left carotid stent 2/15.  Possible CVA in 7/15.  16. Positional vertigo (suspected) 17. Palpitations: Holter (6/15) with rare PVCs/PACs.  18. Dyspnea with Brilinta. 19. Melanoma: On face, s/p excision.   20. Aortic stenosis: Mild.  21. Holter (8/16)  with no significant arrhythmias.  22. Lower extremity arterial dopplers (9/16) were normal.   SH: Married, nonsmoker  FH: CAD  ROS: All systems reviewed and negative except as per HPI.   Current Outpatient Prescriptions  Medication Sig Dispense Refill  . acetaminophen (TYLENOL) 325 MG tablet Take 650 mg by mouth every 6 (six) hours as needed for mild pain.     Marland Kitchen albuterol (PROVENTIL) (2.5 MG/3ML) 0.083% nebulizer solution Take 2.5 mg by nebulization every 6 (six) hours as needed for shortness of breath (when asthma is active).     Marland Kitchen albuterol (PROVENTIL,VENTOLIN) 90 MCG/ACT inhaler Inhale 2 puffs into the lungs every 6 (six) hours as needed for shortness of breath.     . Alirocumab (PRALUENT) 150 MG/ML SOPN Inject 1 pen into the skin every 14 (fourteen) days. 6 pen 3  . ALPRAZolam (XANAX) 0.5 MG tablet Take 0.5-1 mg by mouth 3 (three) times daily. 0.5mg  in the morning and at lunch, 1mg  at bedtime    . amLODipine (NORVASC) 10 MG tablet Take 10 mg by mouth daily.    Marland Kitchen  aspirin EC 81 MG tablet Take 162 mg by mouth daily.    Marland Kitchen b complex vitamins capsule Take 1 capsule by mouth daily.    Marland Kitchen buPROPion (WELLBUTRIN XL) 150 MG 24 hr tablet Take 1 tablet by mouth daily.    . Calcium Carbonate 1500 (600 CA) MG TABS Take 1,500 mg by mouth daily with breakfast.    . Cholecalciferol (VITAMIN D) 2000 UNITS tablet Take 2,000 Units by mouth daily with breakfast.     . cloNIDine (CATAPRES) 0.2 MG tablet Take 0.2 mg by mouth 2 (two) times daily.    . clopidogrel (PLAVIX) 75 MG tablet TAKE ONE (1) TABLET BY MOUTH EVERY DAY 90 tablet 3  . denosumab (PROLIA) 60 MG/ML SOLN injection Inject 60 mg into the skin every 6 (six) months. Administer in upper arm, thigh, or abdomen    . DULoxetine (CYMBALTA) 60 MG capsule Take 30 mg by mouth daily with breakfast.     . Fluticasone-Salmeterol (ADVAIR DISKUS) 250-50 MCG/DOSE AEPB Inhale 1 puff into the lungs every 12 (twelve) hours.     . gabapentin (NEURONTIN) 600 MG tablet  Take 600-750 mg by mouth 3 (three) times daily. 600mg  in the morning and at noon. 750mg  at bedtime.    Marland Kitchen HUMALOG KWIKPEN 100 UNIT/ML SOPN Inject 18-26 Units into the skin 4 (four) times daily -  with meals and at bedtime. 10 units at every meal and 14 units at bedtime if needed    . HYDROcodone-acetaminophen (NORCO/VICODIN) 5-325 MG per tablet Take 1 tablet by mouth at bedtime as needed for moderate pain.     Marland Kitchen ipratropium-albuterol (DUONEB) 0.5-2.5 (3) MG/3ML SOLN Take 3 mLs by nebulization as needed (If severe asthma , use every 4 hours for 10 days).     Marland Kitchen LANTUS SOLOSTAR 100 UNIT/ML Solostar Pen Inject 48-52 Units into the skin every morning.     . loperamide (IMODIUM) 2 MG capsule Take 2 mg by mouth as needed for diarrhea or loose stools.    Marland Kitchen losartan (COZAAR) 100 MG tablet Take 100 mg by mouth daily with breakfast.     . magnesium oxide (MAG-OX) 400 MG tablet Take 400 mg by mouth 2 (two) times daily.    . meclizine (ANTIVERT) 12.5 MG tablet Take 12.5 mg by mouth 3 (three) times daily as needed for dizziness.    . methocarbamol (ROBAXIN) 500 MG tablet TAKE ONE (1) TABLET THREE (3) TIMES EACH DAY 60 tablet 1  . metoprolol succinate (TOPROL-XL) 25 MG 24 hr tablet TAKE ONE TABLET TWICE DAILY 180 tablet 3  . Multiple Vitamin (MULTIVITAMIN) capsule Take 1 capsule by mouth daily.    . multivitamin-lutein (OCUVITE-LUTEIN) CAPS capsule Take 1 capsule by mouth daily.    . nitroGLYCERIN (NITROSTAT) 0.3 MG SL tablet Place 1 tablet (0.3 mg total) under the tongue every 5 (five) minutes x 3 doses as needed for chest pain. 90 tablet 1  . ondansetron (ZOFRAN) 4 MG tablet Take 1 tablet (4 mg total) by mouth 2 (two) times daily as needed. For nausea (Patient taking differently: Take 4 mg by mouth 2 (two) times daily. For nausea) 20 tablet 0  . pantoprazole (PROTONIX) 40 MG tablet Take 1 tablet (40 mg total) by mouth 2 (two) times daily. 180 tablet 0  . RANEXA 1000 MG SR tablet TAKE ONE (1) TABLET BY MOUTH TWO  (2) TIMES DAILY 60 each 6  . rosuvastatin (CRESTOR) 40 MG tablet TAKE ONE (1) TABLET EACH DAY 90 tablet 6  .  torsemide (DEMADEX) 20 MG tablet Take 4 tablets (80 mg total) by mouth daily. 120 tablet 3  . triamterene-hydrochlorothiazide (MAXZIDE-25) 37.5-25 MG tablet Take 1 tablet by mouth daily.    . vitamin B-12 (CYANOCOBALAMIN) 1000 MCG tablet Take 1,000 mcg by mouth daily.    Marland Kitchen zinc gluconate 50 MG tablet Take 50 mg by mouth at bedtime.     Marland Kitchen zolpidem (AMBIEN) 10 MG tablet Take 10 mg by mouth at bedtime as needed for sleep.     Marland Kitchen ezetimibe (ZETIA) 10 MG tablet Take 1 tablet (10 mg total) by mouth daily. 30 tablet 11   No current facility-administered medications for this encounter.     BP (!) 172/76 (BP Location: Left Arm, Patient Position: Sitting, Cuff Size: Large)   Pulse 73   Wt 298 lb 12.8 oz (135.5 kg)   SpO2 95%   BMI 54.21 kg/m  PHYSICAL EXAM General: Obese female, SOB with walking into clinic  HEENT: normal Neck: supple. JVP 8cm. Carotids 2+ bilat; no bruits. No thyromegaly or nodule noted. Cor: PMI nondisplaced. RRR, No M/G/R noted Lungs: Bilateral upper wheezing, diminished in bilateral lower lobes.  Abdomen: soft, non-tender, distended, no HSM. No bruits or masses. + bowel sounds.  Extremities: no cyanosis, clubbing, rash, warm. 1-2+ pedal edema.  Neuro: alert & orientedx3, cranial nerves grossly intact. moves all 4 extremities w/o difficulty. Affect pleasant   Assessment/Plan: 1. CAD: Occluded native RCA and SVG-RCA.  Now status post opening of CTO RCA with 4 overlapping Xience DES.  Most recent cath in 4/17 showed patent RCA stents, no interventional target.  - Continue ASA + Plavix.    - Has not tolerated Imdur in the past.  - Now with symptoms concerning for angina, recently developed interscapular pain and diaphoresis which is reminiscent of her prior MI's. Spoke with Dr. [name], will proceed with cath tomorrow to evaluate anatomy.  - Will draw all pre cath labs.   - EKG non ischemic today.  2. Chronic diastolic CHF:   - NYHA II-III  - Volume status stable on exam - Tolerating cardiac rehab, she has gone a couple of times and does a minute or 2 of activity and then rests.  - Continue torsemide 80mg  daily.  3. Hyperlipidemia: - Followed at the lipid clinic - Continue Praluent  - LDL 97.  4. Hypertension:  - Well controlled on current regimen.    5. Cerebrovascular disease: She has history of CVA and had left carotid stent in 2/15. -  Followed by [facility]  - Recent MRI with worsening R carotid disease - She has follow up with [facility] in July.  6. Palpitations: Denies palpitations today.  - Continue Toprol XL   7. Suspected sleep apnea. - Sleep study done 05/2016 - No significant OSA, no central sleep apnea.  Will set up for cath as above. Follow up in one month.     Arbutus Leas NP-C  07/08/2016

## 2016-07-09 NOTE — Progress Notes (Signed)
Received report/ accepted care.

## 2016-07-09 NOTE — Interval H&P Note (Signed)
History and Physical Interval Note:  07/09/2016 12:45 PM  [name] [name]  has presented today for surgery, with the diagnosis of chf  The various methods of treatment have been discussed with the patient and family. After consideration of risks, benefits and other options for treatment, the patient has consented to  Procedure(s): RIGHT/LEFT HEART CATH AND CORONARY/GRAFT ANGIOGRAPHY (N/A) as a surgical intervention .  The patient's history has been reviewed, patient examined, no change in status, stable for surgery.  I have reviewed the patient's chart and labs.  Questions were answered to the patient's satisfaction.     Halia Franey [employer]

## 2016-07-09 NOTE — Progress Notes (Signed)
Site area: rt groin fa sheath Site Prior to Removal:  Level 0 Pressure Applied For:20 minutes Manual:   yes Patient Status During Pull:  stable Post Pull Site:  Level 0 Post Pull Instructions Given:  yes Post Pull Pulses Present: dopplered Dressing Applied:  Gauze and tegaderm Bedrest begins @ 1410 Comments:

## 2016-07-09 NOTE — Discharge Instructions (Signed)

## 2016-07-12 ENCOUNTER — Encounter (HOSPITAL_COMMUNITY): Payer: Self-pay | Admitting: Cardiology

## 2016-07-12 ENCOUNTER — Telehealth (HOSPITAL_COMMUNITY): Payer: Self-pay | Admitting: *Deleted

## 2016-07-12 ENCOUNTER — Telehealth (HOSPITAL_COMMUNITY): Payer: Self-pay | Admitting: Family Medicine

## 2016-07-12 ENCOUNTER — Encounter (HOSPITAL_COMMUNITY): Payer: Medicare Other

## 2016-07-13 ENCOUNTER — Telehealth (HOSPITAL_COMMUNITY): Payer: Self-pay | Admitting: *Deleted

## 2016-07-13 NOTE — Telephone Encounter (Signed)
-----   Message from Larey Dresser, MD sent at 07/12/2016  8:58 PM EDT ----- Regarding: RE: cardiac rehab Ok to return to rehab.  ----- Message ----- From: Magda Kiel, RN Sent: 07/12/2016   4:39 PM To: Larey Dresser, MD Subject: cardiac rehab                                  Dr [name],  Good afternoon,  I want to check with you when will it be okay for Mrs Donahoo to resume exercise at cardiac rehab?   Thanks for your input,  Barnet Pall RN.  Tresa's next follow up in the heart failure clinic is on 08/10/2016

## 2016-07-14 ENCOUNTER — Encounter (HOSPITAL_COMMUNITY): Payer: Medicare Other

## 2016-07-15 ENCOUNTER — Other Ambulatory Visit: Payer: Self-pay | Admitting: Cardiology

## 2016-07-16 ENCOUNTER — Encounter (HOSPITAL_COMMUNITY)
Admission: RE | Admit: 2016-07-16 | Discharge: 2016-07-16 | Disposition: A | Discharge status: Home / Self Care | Payer: Medicare Other | Source: Ambulatory Visit | Attending: Cardiology | Admitting: Cardiology

## 2016-07-16 DIAGNOSIS — I208 Other forms of angina pectoris: Secondary | ICD-10-CM

## 2016-07-16 DIAGNOSIS — R0902 Hypoxemia: Secondary | ICD-10-CM | POA: Diagnosis not present

## 2016-07-19 ENCOUNTER — Encounter (HOSPITAL_COMMUNITY)
Admission: RE | Admit: 2016-07-19 | Discharge: 2016-07-19 | Disposition: A | Discharge status: Home / Self Care | Payer: Medicare Other | Source: Ambulatory Visit | Attending: Cardiology | Admitting: Cardiology

## 2016-07-19 DIAGNOSIS — R0902 Hypoxemia: Secondary | ICD-10-CM | POA: Diagnosis not present

## 2016-07-19 DIAGNOSIS — I208 Other forms of angina pectoris: Secondary | ICD-10-CM

## 2016-07-19 NOTE — Progress Notes (Signed)
Cardiac Individual Treatment Plan  Patient Details  Name: JACKLYN BRANAN [name]: [id number] Date of Birth: [birth year] Referring Provider:     [place] from 06/17/2016 in [place]  Referring Provider  Loralie Champagne MD      Initial Encounter Date:    CARDIAC REHAB PHASE II ORIENTATION from 06/17/2016 in [place]  Date  06/17/16  Referring Provider  Loralie Champagne MD      Visit Diagnosis: Stable angina [facility])  Patient's Home Medications on Admission:  Current Outpatient Prescriptions:  .  acetaminophen (TYLENOL) 325 MG tablet, Take 650 mg by mouth every 6 (six) hours as needed for mild pain. , Disp: , Rfl:  .  albuterol (PROVENTIL) (2.5 MG/3ML) 0.083% nebulizer solution, Take 2.5 mg by nebulization every 6 (six) hours as needed for shortness of breath (when asthma is active). , Disp: , Rfl:  .  albuterol (PROVENTIL,VENTOLIN) 90 MCG/ACT inhaler, Inhale 2 puffs into the lungs every 6 (six) hours as needed for shortness of breath. , Disp: , Rfl:  .  Alirocumab (PRALUENT) 150 MG/ML SOPN, Inject 1 pen into the skin every 14 (fourteen) days., Disp: 6 pen, Rfl: 3 .  ALPRAZolam (XANAX) 0.5 MG tablet, Take 0.5-1 mg by mouth 3 (three) times daily. 0.5mg  in the morning and at lunch, 1mg  at bedtime, Disp: , Rfl:  .  amLODipine (NORVASC) 10 MG tablet, Take 10 mg by mouth at bedtime. , Disp: , Rfl:  .  aspirin EC 81 MG tablet, Take 162 mg by mouth daily., Disp: , Rfl:  .  b complex vitamins capsule, Take 1 capsule by mouth daily., Disp: , Rfl:  .  buPROPion (WELLBUTRIN XL) 150 MG 24 hr tablet, Take 1 tablet by mouth daily., Disp: , Rfl:  .  Calcium Carbonate 1500 (600 CA) MG TABS, Take 1,500 mg by mouth daily with breakfast., Disp: , Rfl:  .  Cholecalciferol (VITAMIN D) 2000 UNITS tablet, Take 2,000 Units by mouth daily with breakfast. , Disp: , Rfl:  .  cloNIDine (CATAPRES) 0.2 MG tablet, Take 0.2 mg by mouth 2  (two) times daily., Disp: , Rfl:  .  clopidogrel (PLAVIX) 75 MG tablet, TAKE ONE (1) TABLET BY MOUTH EVERY DAY, Disp: 90 tablet, Rfl: 3 .  denosumab (PROLIA) 60 MG/ML SOLN injection, Inject 60 mg into the skin every 6 (six) months. Administer in upper arm, thigh, or abdomen, Disp: , Rfl:  .  DULoxetine (CYMBALTA) 30 MG capsule, Take 30 mg by mouth daily with breakfast. , Disp: , Rfl:  .  ezetimibe (ZETIA) 10 MG tablet, Take 1 tablet (10 mg total) by mouth daily., Disp: 30 tablet, Rfl: 11 .  ezetimibe (ZETIA) 10 MG tablet, TAKE ONE (1) TABLET EACH DAY, Disp: 90 tablet, Rfl: 3 .  Fluticasone-Salmeterol (ADVAIR DISKUS) 250-50 MCG/DOSE AEPB, Inhale 1 puff into the lungs 2 (two) times daily. , Disp: , Rfl:  .  gabapentin (NEURONTIN) 600 MG tablet, Take 600-1,200 mg by mouth 3 (three) times daily. 600mg  in the morning and at noon. 1200 mg at bedtime., Disp: , Rfl:  .  HUMALOG KWIKPEN 100 UNIT/ML SOPN, Inject 18-26 Units into the skin 4 (four) times daily -  with meals and at bedtime. 10 units at every meal and 14 units at bedtime if needed, Disp: , Rfl:  .  HYDROcodone-acetaminophen (NORCO/VICODIN) 5-325 MG per tablet, Take 1 tablet by mouth at bedtime as needed for moderate pain. , Disp: ,  Rfl:  .  ipratropium-albuterol (DUONEB) 0.5-2.5 (3) MG/3ML SOLN, Take 3 mLs by nebulization as needed (If severe asthma , use every 4 hours for 10 days). , Disp: , Rfl:  .  LANTUS SOLOSTAR 100 UNIT/ML Solostar Pen, Inject 48-52 Units into the skin every morning. Can increase or decrease by 4 units depending on blood sugar levels, Disp: , Rfl:  .  loperamide (IMODIUM) 2 MG capsule, Take 2 mg by mouth as needed for diarrhea or loose stools., Disp: , Rfl:  .  losartan (COZAAR) 100 MG tablet, Take 100 mg by mouth daily with breakfast. , Disp: , Rfl:  .  magnesium oxide (MAG-OX) 400 MG tablet, Take 400 mg by mouth 2 (two) times daily., Disp: , Rfl:  .  meclizine (ANTIVERT) 12.5 MG tablet, Take 12.5 mg by mouth 3 (three) times  daily as needed for dizziness., Disp: , Rfl:  .  methocarbamol (ROBAXIN) 500 MG tablet, TAKE ONE (1) TABLET THREE (3) TIMES EACH DAY, Disp: 60 tablet, Rfl: 1 .  metoprolol succinate (TOPROL-XL) 25 MG 24 hr tablet, TAKE ONE TABLET TWICE DAILY, Disp: 180 tablet, Rfl: 3 .  Multiple Vitamin (MULTIVITAMIN) capsule, Take 1 capsule by mouth daily., Disp: , Rfl:  .  multivitamin-lutein (OCUVITE-LUTEIN) CAPS capsule, Take 1 capsule by mouth daily., Disp: , Rfl:  .  nitroGLYCERIN (NITROSTAT) 0.3 MG SL tablet, Place 1 tablet (0.3 mg total) under the tongue every 5 (five) minutes x 3 doses as needed for chest pain., Disp: 90 tablet, Rfl: 1 .  ondansetron (ZOFRAN) 4 MG tablet, Take 1 tablet (4 mg total) by mouth 2 (two) times daily as needed. For nausea (Patient taking differently: Take 4 mg by mouth 2 (two) times daily. For nausea), Disp: 20 tablet, Rfl: 0 .  OXYGEN, Inhale into the lungs., Disp: , Rfl:  .  pantoprazole (PROTONIX) 40 MG tablet, Take 1 tablet (40 mg total) by mouth 2 (two) times daily., Disp: 180 tablet, Rfl: 0 .  RANEXA 1000 MG SR tablet, TAKE ONE (1) TABLET BY MOUTH TWO (2) TIMES DAILY, Disp: 60 each, Rfl: 6 .  rosuvastatin (CRESTOR) 40 MG tablet, TAKE ONE (1) TABLET EACH DAY, Disp: 90 tablet, Rfl: 6 .  torsemide (DEMADEX) 20 MG tablet, Take 4 tablets (80 mg total) by mouth daily. (Patient taking differently: Take 80 mg by mouth every morning. ), Disp: 120 tablet, Rfl: 3 .  vitamin B-12 (CYANOCOBALAMIN) 1000 MCG tablet, Take 1,000 mcg by mouth daily., Disp: , Rfl:  .  zinc gluconate 50 MG tablet, Take 50 mg by mouth at bedtime. , Disp: , Rfl:  .  zolpidem (AMBIEN) 10 MG tablet, Take 10 mg by mouth at bedtime. , Disp: , Rfl:   Past Medical History: Past Medical History:  Diagnosis Date  . Abnormality of gait 05/14/2014  . Anemia    hx  . Anginal pain ([place])   . Anxiety   . Asthma   . Bundle branch block, left    chronic/notes 07/18/2013  . Cancer [facility]) April 2016   BCC,  Melanoma  .  CHF (congestive heart failure) ([place])   . Chronic bronchitis ([place])    "off and on all the time" (07/18/2013)  . Chronic insomnia 05/06/2015  . Chronic kidney disease    frequency, sses dr Jamal Maes every 4 to 6 months  . Chronic lower back pain   . Claustrophobia   . Common migraine 05/14/2014  . Coronary artery disease   . Dysrhythmia   .  GERD (gastroesophageal reflux disease)   . H/O hiatal hernia   . Headache    "probably 4 days/wk" (07/18/2013)  . Heart murmur   . Hyperlipidemia   . Hypertension   . Irregular heart beat   . Leg cramps    both legs at times  . Memory change 05/14/2014  . Mental disorder    hx of confusion  . Migraine    "used to have them really bad; kind of eased up now; down to 1 q couple months" (07/18/2013)  . Myocardial infarction ([place])    04/1999, 02/2000, 01/2005; 2011; 2014  . Neuromuscular disorder ([place])    ?  . Obesity 01-2010  . Obstructive sleep apnea    "can't wear machine; I have claustrophobia" (07/18/2013)  . Osteoarthritis    "knees and hands" (07/18/2013)  . Other and unspecified angina pectoris   . Peripheral vascular disease (H[facility])    ? numbness, tingling arms and legs  . Pneumonia 2000's   "once"  . PONV (postoperative nausea and vomiting)   . Shortness of breath    with exertion  . Stroke ([place]) 03/22/12   right side brain; denies residual on 07/18/2013)  . Stroke [facility]) Oct. 2015   Affected pt.s balance  . Stroke ([place]) 10/2014  . Type II diabetes mellitus ([place])   . Ventral hernia    hx of  . Vomiting    persistent  . Vomiting blood     Tobacco Use: History  Smoking Status  . Never Smoker  Smokeless Tobacco  . Never Used    Labs: Recent Review Flowsheet Data    Labs for ITP Cardiac and Pulmonary Rehab Latest Ref Rng & Units 05/15/2014 11/13/2014 05/29/2015 06/24/2015 03/12/2016   Cholestrol 0 - 200 mg/dL 141 164 - 183 189   LDLCALC 0 - 99 mg/dL 50 75 - 96 97   LDLDIRECT mg/dL - - - - -   HDL >40 mg/dL 55.60 56 - 56 56    Trlycerides <150 mg/dL 179.0(H) 163(H) - 156(H) 181(H)   Hemoglobin A1c <5.7 % - - - - -   PHART 7.350 - 7.400 - - - - -   PCO2ART 35.0 - 45.0 mmHg - - - - -   HCO3 20.0 - 24.0 mEq/L - - 30.3(H) - -   TCO2 0 - 100 mmol/L - - 32 - -   ACIDBASEDEF 0.0 - 2.0 mmol/L - - - - -   O2SAT % - - 70.0 - -      Capillary Blood Glucose: Lab Results  Component Value Date   GLUCAP 98 07/09/2016   GLUCAP 256 (H) 07/09/2016   GLUCAP 200 (H) 07/02/2016   GLUCAP 151 (H) 06/30/2016   GLUCAP 248 (H) 06/30/2016     Exercise Target Goals:    Exercise Program Goal: Individual exercise prescription set with THRR, safety & activity barriers. Participant demonstrates ability to understand and report RPE using BORG scale, to self-measure pulse accurately, and to acknowledge the importance of the exercise prescription.  Exercise Prescription Goal: Starting with aerobic activity 30 plus minutes a day, 3 days per week for initial exercise prescription. Provide home exercise prescription and guidelines that participant acknowledges understanding prior to discharge.  Activity Barriers & Risk Stratification:     Activity Barriers & Cardiac Risk Stratification - 06/17/16 0837      Activity Barriers & Cardiac Risk Stratification   Cardiac Risk Stratification High      6 Minute Walk:  6 Minute Walk    Row Name 06/17/16 1117         6 Minute Walk   Phase Initial     Distance 463 feet     Walk Time 6 minutes     # of Rest Breaks 0     MPH 0.9     METS 0.6     RPE 17     VO2 Peak 2.1     Symptoms No     Resting HR 76 bpm     Resting BP 140/70     Max Ex. HR 96 bpm     Max Ex. BP 208/96     2 Minute Post BP 165/65        Oxygen Initial Assessment:   Oxygen Re-Evaluation:   Oxygen Discharge (Final Oxygen Re-Evaluation):   Initial Exercise Prescription:     Initial Exercise Prescription - 06/17/16 1100      Date of Initial Exercise RX and Referring Provider   Date 06/17/16    Referring Provider Loralie Champagne MD     NuStep   Level 1   SPM 80   Minutes 20   METs 1.8     Arm Ergometer   Level 1   Watts 25   Minutes 10   METs 1.9     Prescription Details   Frequency (times per week) 3   Duration Progress to 45 minutes of aerobic exercise without signs/symptoms of physical distress     Intensity   THRR 40-80% of Max Heartrate 61-122   Ratings of Perceived Exertion 11-13   Perceived Dyspnea 0-4     Progression   Progression Continue to progress workloads to maintain intensity without signs/symptoms of physical distress.     Resistance Training   Training Prescription Yes   Weight 2   Reps 10-15      Perform Capillary Blood Glucose checks as needed.  Exercise Prescription Changes:      Exercise Prescription Changes    Row Name 07/15/16 0800             Response to Exercise   Blood Pressure (Admit) (P)  118/74       Blood Pressure (Exercise) (P)  152/80       Blood Pressure (Exit) (P)  142/80       Heart Rate (Admit) (P)  68 bpm       Heart Rate (Exercise) (P)  83 bpm       Heart Rate (Exit) (P)  50 bpm       Rating of Perceived Exertion (Exercise) (P)  11       Duration (P)  Progress to 45 minutes of aerobic exercise without signs/symptoms of physical distress       Intensity (P)  THRR unchanged         Progression   Progression (P)  Continue to progress workloads to maintain intensity without signs/symptoms of physical distress.         Resistance Training   Training Prescription (P)  Yes       Weight (P)  2       Reps (P)  10-15         NuStep   Level (P)  1       SPM (P)  80       Minutes (P)  20       METs (P)  1.8         Arm Ergometer  Level (P)  1       Watts (P)  25       Minutes (P)  10       METs (P)  1.9          Exercise Comments:      Exercise Comments    Row Name 06/23/16 1117 07/19/16 1636         Exercise Comments Pt is off to a good start with exercise! Reviewed METs and goals with pt.           Exercise Goals and Review:      Exercise Goals    Row Name 06/17/16 0837             Exercise Goals   Increase Physical Activity Yes  increase functional mobility       Intervention Provide advice, education, support and counseling about physical activity/exercise needs.;Develop an individualized exercise prescription for aerobic and resistive training based on initial evaluation findings, risk stratification, comorbidities and participant's personal goals.       Expected Outcomes Achievement of increased cardiorespiratory fitness and enhanced flexibility, muscular endurance and strength shown through measurements of functional capacity and personal statement of participant.       Increase Strength and Stamina Yes  develop an exercise routine, learn exercise limitations       Intervention Develop an individualized exercise prescription for aerobic and resistive training based on initial evaluation findings, risk stratification, comorbidities and participant's personal goals.;Provide advice, education, support and counseling about physical activity/exercise needs.       Expected Outcomes Achievement of increased cardiorespiratory fitness and enhanced flexibility, muscular endurance and strength shown through measurements of functional capacity and personal statement of participant.          Exercise Goals Re-Evaluation :     Exercise Goals Re-Evaluation    Row Name 07/19/16 1634             Exercise Goal Re-Evaluation   Exercise Goals Review Increase Physical Activity;Increase Strenth and Stamina       Comments Pt has returned to rehab after being out for a few days due to her catherization, but she continues to do well with exercise.        Expected Outcomes Continue with exercise prescription and increase workload as tolerated in order to increase exercise tolerance.            Discharge Exercise Prescription (Final Exercise Prescription Changes):     Exercise  Prescription Changes - 07/15/16 0800      Response to Exercise   Blood Pressure (Admit) (P)  118/74   Blood Pressure (Exercise) (P)  152/80   Blood Pressure (Exit) (P)  142/80   Heart Rate (Admit) (P)  68 bpm   Heart Rate (Exercise) (P)  83 bpm   Heart Rate (Exit) (P)  50 bpm   Rating of Perceived Exertion (Exercise) (P)  11   Duration (P)  Progress to 45 minutes of aerobic exercise without signs/symptoms of physical distress   Intensity (P)  THRR unchanged     Progression   Progression (P)  Continue to progress workloads to maintain intensity without signs/symptoms of physical distress.     Resistance Training   Training Prescription (P)  Yes   Weight (P)  2   Reps (P)  10-15     NuStep   Level (P)  1   SPM (P)  80   Minutes (P)  20   METs (P)  1.8     Arm Ergometer   Level (P)  1   Watts (P)  25   Minutes (P)  10   METs (P)  1.9      Nutrition:  Target Goals: Understanding of nutrition guidelines, daily intake of sodium 1500mg , cholesterol 200mg , calories 30% from fat and 7% or less from saturated fats, daily to have 5 or more servings of fruits and vegetables.  Biometrics:     Pre Biometrics - 06/17/16 1223      Pre Biometrics   Height 5' 2.25" (1.581 m)   Weight (!)  302 lb 11.1 oz (137.3 kg)   Hip Circumference 64 inches   BMI (Calculated) 55   Triceps Skinfold 70 mm   % Body Fat 65.1 %   Grip Strength 28 kg   Flexibility 8 in   Single Leg Stand 1.03 seconds       Nutrition Therapy Plan and Nutrition Goals:     Nutrition Therapy & Goals - 06/30/16 1140      Nutrition Therapy   Diet Carb Modified, Therapeutic Lifestyle Changes     Personal Nutrition Goals   Nutrition Goal Wt loss of 1-2 lb/week to a wt loss goal of 6-24 lb at graduation from [place], educate and counsel regarding individualized specific dietary modifications aiming towards targeted core components such as weight,  hypertension, lipid management, diabetes, heart failure and other comorbidities.   Expected Outcomes Short Term Goal: Understand basic principles of dietary content, such as calories, fat, sodium, cholesterol and nutrients.;Long Term Goal: Adherence to prescribed nutrition plan.      Nutrition Discharge: Nutrition Scores:     Nutrition Assessments - 06/30/16 1142      MEDFICTS Scores   Pre Score 6      Nutrition Goals Re-Evaluation:   Nutrition Goals Re-Evaluation:   Nutrition Goals Discharge (Final Nutrition Goals Re-Evaluation):   Psychosocial: Target Goals: Acknowledge presence or absence of significant depression and/or stress, maximize coping skills, provide positive support system. Participant is able to verbalize types and ability to use techniques and skills needed for reducing stress and depression.  Initial Review & Psychosocial Screening:     Initial Psych Review & Screening - 06/17/16 1221      Initial Review   Current issues with History of Depression     Family Dynamics   Good Support System? Yes     Barriers   Psychosocial barriers to participate in program The patient should benefit from training in stress management and relaxation.     Screening Interventions   Interventions Encouraged to exercise;To provide support and resources with identified psychosocial needs;Provide feedback about the scores to participant      Quality of Life Scores:     Quality of Life - 07/05/16 1752      Quality of Life Scores   Health/Function Pre --  Tanai is dissatisfied with her health due to her CAD and diabetes   Psych/Spiritual Pre --  Appointment set up with [facility] hospital chaplain for couselling.        PHQ-9: Recent Review Flowsheet Data    Depression screen [facility] 2/9 06/23/2016 06/24/2015 04/14/2015 02/04/2015 11/25/2014   Decreased Interest 0 1 1 1 1    Down, Depressed, Hopeless 1  1 1 1 1    PHQ - 2 Score 1 2 2 2 2    Altered sleeping - - - - 2    Tired, decreased  energy - - - - 3   Change in appetite - - - - 3    Feeling bad or failure about yourself  - - - - 3   Trouble concentrating - - - - 3   Moving slowly or fidgety/restless - - - - 0   Suicidal thoughts - - - - 0   PHQ-9 Score - - - - 16   Difficult doing work/chores - - - - Somewhat difficult     Interpretation of Total Score  Total Score Depression Severity:  1-4 = Minimal depression, 5-9 = Mild depression, 10-14 = Moderate depression, 15-19 = Moderately severe depression, 20-27 = Severe depression   Psychosocial Evaluation and Intervention:   Psychosocial Re-Evaluation:     Psychosocial Re-Evaluation    Row Name 07/19/16 1629 07/19/16 1634           Psychosocial Re-Evaluation   Current issues with History of Depression  -      Comments  - -  Taryn will be meeting with the hospital chaplian in the near future      Interventions Stress management education;Encouraged to attend Cardiac Rehabilitation for the exercise  -         Psychosocial Discharge (Final Psychosocial Re-Evaluation):     Psychosocial Re-Evaluation - 07/19/16 1634      Psychosocial Re-Evaluation   Comments --  Lynnleigh will be meeting with the hospital chaplian in the near future      Vocational Rehabilitation: Provide vocational rehab assistance to qualifying candidates.   Vocational Rehab Evaluation & Intervention:     Vocational Rehab - 06/17/16 1221      Initial Vocational Rehab Evaluation & Intervention   Assessment shows need for Vocational Rehabilitation No      Education: Education Goals: Education classes will be provided on a weekly basis, covering required topics. Participant will state understanding/return demonstration of topics presented.  Learning Barriers/Preferences:     Learning Barriers/Preferences - 06/17/16 0835      Learning Barriers/Preferences   Learning Barriers Sight   Learning Preferences Skilled Demonstration      Education  Topics: Count Your Pulse:  -Group instruction provided by verbal instruction, demonstration, patient participation and written materials to support subject.  Instructors address importance of being able to find your pulse and how to count your pulse when at home without a heart monitor.  Patients get hands on experience counting their pulse with staff help and individually.   Heart Attack, Angina, and Risk Factor Modification:  -Group instruction provided by verbal instruction, video, and written materials to support subject.  Instructors address signs and symptoms of angina and heart attacks.    Also discuss risk factors for heart disease and how to make changes to improve heart health risk factors.   CARDIAC REHAB PHASE II EXERCISE from 07/07/2016 in [place]  Date  06/23/16  Instruction Review Code  2- meets goals/outcomes      Functional Fitness:  -Group instruction provided by verbal instruction, demonstration, patient participation, and written materials to support subject.  Instructors address safety measures for doing things around the house.  Discuss how to get up and down off the floor, how to pick things up properly, how to safely get out of a chair without assistance, and balance training.   Meditation and Mindfulness:  -Group instruction provided by verbal instruction, patient participation, and written materials to support subject.  Instructor addresses importance of mindfulness and meditation practice to help reduce  stress and improve awareness.  Instructor also leads participants through a meditation exercise.    CARDIAC REHAB PHASE II EXERCISE from 07/07/2016 in [place]  Date  07/07/16  Instruction Review Code  2- meets goals/outcomes      Stretching for Flexibility and Mobility:  -Group instruction provided by verbal instruction, patient participation, and written materials to support subject.  Instructors  lead participants through series of stretches that are designed to increase flexibility thus improving mobility.  These stretches are additional exercise for major muscle groups that are typically performed during regular warm up and cool down.   CARDIAC REHAB PHASE II EXERCISE from 07/07/2016 in [place]  Date  06/25/16  Instruction Review Code  2- meets goals/outcomes      Hands Only CPR:  -Group verbal, video, and participation provides a basic overview of AHA guidelines for community CPR. Role-play of emergencies allow participants the opportunity to practice calling for help and chest compression technique with discussion of AED use.   Hypertension: -Group verbal and written instruction that provides a basic overview of hypertension including the most recent diagnostic guidelines, risk factor reduction with self-care instructions and medication management.    Nutrition I class: Heart Healthy Eating:  -Group instruction provided by PowerPoint slides, verbal discussion, and written materials to support subject matter. The instructor gives an explanation and review of the Therapeutic Lifestyle Changes diet recommendations, which includes a discussion on lipid goals, dietary fat, sodium, fiber, plant stanol/sterol esters, sugar, and the components of a well-balanced, healthy diet.   CARDIAC REHAB PHASE II EXERCISE from 07/07/2016 in [place]  Date  07/07/16  Educator  RD  Instruction Review Code  Not applicable [class handouts given]      Nutrition II class: Lifestyle Skills:  -Group instruction provided by PowerPoint slides, verbal discussion, and written materials to support subject matter. The instructor gives an explanation and review of label reading, grocery shopping for heart health, heart healthy recipe modifications, and ways to make healthier choices when eating out.   CARDIAC REHAB PHASE II EXERCISE from 07/07/2016  in [place]  Date  07/07/16  Educator  RD  Instruction Review Code  Not applicable [class handouts given]      Diabetes Question & Answer:  -Group instruction provided by PowerPoint slides, verbal discussion, and written materials to support subject matter. The instructor gives an explanation and review of diabetes co-morbidities, pre- and post-prandial blood glucose goals, pre-exercise blood glucose goals, signs, symptoms, and treatment of hypoglycemia and hyperglycemia, and foot care basics.   Diabetes Blitz:  -Group instruction provided by PowerPoint slides, verbal discussion, and written materials to support subject matter. The instructor gives an explanation and review of the physiology behind type 1 and type 2 diabetes, diabetes medications and rational behind using different medications, pre- and post-prandial blood glucose recommendations and Hemoglobin A1c goals, diabetes diet, and exercise including blood glucose guidelines for exercising safely.    CARDIAC REHAB PHASE II EXERCISE from 07/07/2016 in [place]  Date  07/07/16  Educator  RD  Instruction Review Code  Not applicable [class handouts given]      Portion Distortion:  -Group instruction provided by PowerPoint slides, verbal discussion, written materials, and food models to support subject matter. The instructor gives an explanation of serving size versus portion size, changes in portions sizes over the last 20 years, and what consists  of a serving from each food group.   Stress Management:  -Group instruction provided by verbal instruction, video, and written materials to support subject matter.  Instructors review role of stress in heart disease and how to cope with stress positively.     Exercising on Your Own:  -Group instruction provided by verbal instruction, power point, and written materials to support subject.  Instructors discuss benefits of  exercise, components of exercise, frequency and intensity of exercise, and end points for exercise.  Also discuss use of nitroglycerin and activating EMS.  Review options of places to exercise outside of rehab.  Review guidelines for sex with heart disease.   Cardiac Drugs I:  -Group instruction provided by verbal instruction and written materials to support subject.  Instructor reviews cardiac drug classes: antiplatelets, anticoagulants, beta blockers, and statins.  Instructor discusses reasons, side effects, and lifestyle considerations for each drug class.   Cardiac Drugs II:  -Group instruction provided by verbal instruction and written materials to support subject.  Instructor reviews cardiac drug classes: angiotensin converting enzyme inhibitors (ACE-I), angiotensin II receptor blockers (ARBs), nitrates, and calcium channel blockers.  Instructor discusses reasons, side effects, and lifestyle considerations for each drug class.   Anatomy and Physiology of the Circulatory System:  Group verbal and written instruction and models provide basic cardiac anatomy and physiology, with the coronary electrical and arterial systems. Review of: AMI, Angina, Valve disease, Heart Failure, Peripheral Artery Disease, Cardiac Arrhythmia, Pacemakers, and the ICD.   CARDIAC REHAB PHASE II EXERCISE from 07/07/2016 in [facility]  Date  06/30/16  Instruction Review Code  2- meets goals/outcomes      Other Education:  -Group or individual verbal, written, or video instructions that support the educational goals of the cardiac rehab program.   Knowledge Questionnaire Score:     Knowledge Questionnaire Score - 06/17/16 1115      Knowledge Questionnaire Score   Pre Score 23/24      Core Components/Risk Factors/Patient Goals at Admission:     Personal Goals and Risk Factors at Admission - 06/17/16 0854      Core Components/Risk Factors/Patient Goals on Admission     Weight Management Yes;Obesity;Weight Maintenance;Weight Loss   Intervention Weight Management: Develop a combined nutrition and exercise program designed to reach desired caloric intake, while maintaining appropriate intake of nutrient and fiber, sodium and fats, and appropriate energy expenditure required for the weight goal.;Weight Management: Provide education and appropriate resources to help participant work on and attain dietary goals.;Weight Management/Obesity: Establish reasonable short term and long term weight goals.;Obesity: Provide education and appropriate resources to help participant work on and attain dietary goals.   Expected Outcomes Long Term: Adherence to nutrition and physical activity/exercise program aimed toward attainment of established weight goal;Short Term: Continue to assess and modify interventions until short term weight is achieved;Weight Maintenance: Understanding of the daily nutrition guidelines, which includes 25-35% calories from fat, 7% or less cal from saturated fats, less than 200mg  cholesterol, less than 1.5gm of sodium, & 5 or more servings of fruits and vegetables daily;Weight Loss: Understanding of general recommendations for a balanced deficit meal plan, which promotes 1-2 lb weight loss per week and includes a negative energy balance of [phone] kcal/d;Understanding recommendations for meals to include 15-35% energy as protein, 25-35% energy from fat, 35-60% energy from carbohydrates, less than 200mg  of dietary cholesterol, 20-35 gm of total fiber daily;Understanding of distribution of calorie intake throughout the day with the consumption of 4-5  meals/snacks   Improve shortness of breath with ADL's Yes   Intervention Provide education, individualized exercise plan and daily activity instruction to help decrease symptoms of SOB with activities of daily living.   Expected Outcomes Short Term: Achieves a reduction of symptoms when performing activities of daily  living.   Diabetes Yes   Intervention Provide education about signs/symptoms and action to take for hypo/hyperglycemia.;Provide education about proper nutrition, including hydration, and aerobic/resistive exercise prescription along with prescribed medications to achieve blood glucose in normal ranges: Fasting glucose 65-99 mg/dL   Expected Outcomes Short Term: Participant verbalizes understanding of the signs/symptoms and immediate care of hyper/hypoglycemia, proper foot care and importance of medication, aerobic/resistive exercise and nutrition plan for blood glucose control.;Long Term: Attainment of HbA1C < 7%.   Heart Failure Yes   Intervention Provide a combined exercise and nutrition program that is supplemented with education, support and counseling about heart failure. Directed toward relieving symptoms such as shortness of breath, decreased exercise tolerance, and extremity edema.   Expected Outcomes Improve functional capacity of life;Short term: Attendance in program 2-3 days a week with increased exercise capacity. Reported lower sodium intake. Reported increased fruit and vegetable intake. Reports medication compliance.;Short term: Daily weights obtained and reported for increase. Utilizing diuretic protocols set by physician.;Long term: Adoption of self-care skills and reduction of barriers for early signs and symptoms recognition and intervention leading to self-care maintenance.      Core Components/Risk Factors/Patient Goals Review:    Core Components/Risk Factors/Patient Goals at Discharge (Final Review):    ITP Comments:     ITP Comments    Row Name 06/17/16 0826           ITP Comments Medical Director, Dr. Fransico Him          Comments: Charnel is making expected progress toward personal goals after completing 9 sessions. Recommend continued exercise and life style modification education including  stress management and relaxation techniques to decrease cardiac risk  profile. Rian has not had any reports or complaints of angina here at cardiac rehab or since her heart catheterization. Kennethia continues to wear oxygen with exercise and takes rest breaks as needed.Will continue to monitor the patient throughout  the program.Brysten Reister Venetia Maxon, RN,BSN 07/21/2016 4:42 PM

## 2016-07-21 ENCOUNTER — Encounter (HOSPITAL_COMMUNITY)
Admission: RE | Admit: 2016-07-21 | Discharge: 2016-07-21 | Disposition: A | Discharge status: Home / Self Care | Payer: Medicare Other | Source: Ambulatory Visit | Attending: Cardiology | Admitting: Cardiology

## 2016-07-21 DIAGNOSIS — I208 Other forms of angina pectoris: Secondary | ICD-10-CM

## 2016-07-21 DIAGNOSIS — R0902 Hypoxemia: Secondary | ICD-10-CM | POA: Diagnosis not present

## 2016-07-23 ENCOUNTER — Encounter (HOSPITAL_COMMUNITY)
Admission: RE | Admit: 2016-07-23 | Discharge: 2016-07-23 | Disposition: A | Discharge status: Home / Self Care | Payer: Medicare Other | Source: Ambulatory Visit | Attending: Cardiology | Admitting: Cardiology

## 2016-07-23 DIAGNOSIS — R0902 Hypoxemia: Secondary | ICD-10-CM | POA: Diagnosis not present

## 2016-07-23 DIAGNOSIS — I208 Other forms of angina pectoris: Secondary | ICD-10-CM

## 2016-07-26 ENCOUNTER — Encounter (HOSPITAL_COMMUNITY)
Admission: RE | Admit: 2016-07-26 | Discharge: 2016-07-26 | Disposition: A | Discharge status: Home / Self Care | Payer: Medicare Other | Source: Ambulatory Visit | Attending: Cardiology | Admitting: Cardiology

## 2016-07-26 DIAGNOSIS — R0902 Hypoxemia: Secondary | ICD-10-CM | POA: Diagnosis not present

## 2016-07-26 DIAGNOSIS — I208 Other forms of angina pectoris: Secondary | ICD-10-CM

## 2016-07-28 ENCOUNTER — Encounter (HOSPITAL_COMMUNITY)
Admission: RE | Admit: 2016-07-28 | Discharge: 2016-07-28 | Disposition: A | Discharge status: Home / Self Care | Payer: Medicare Other | Source: Ambulatory Visit | Attending: Cardiology | Admitting: Cardiology

## 2016-07-28 DIAGNOSIS — I208 Other forms of angina pectoris: Secondary | ICD-10-CM

## 2016-07-28 DIAGNOSIS — R0902 Hypoxemia: Secondary | ICD-10-CM | POA: Diagnosis not present

## 2016-07-30 ENCOUNTER — Encounter (HOSPITAL_COMMUNITY)
Admission: RE | Admit: 2016-07-30 | Discharge: 2016-07-30 | Disposition: A | Discharge status: Home / Self Care | Payer: Medicare Other | Source: Ambulatory Visit | Attending: Cardiology | Admitting: Cardiology

## 2016-07-30 DIAGNOSIS — R0902 Hypoxemia: Secondary | ICD-10-CM | POA: Diagnosis not present

## 2016-07-30 DIAGNOSIS — I208 Other forms of angina pectoris: Secondary | ICD-10-CM

## 2016-08-02 ENCOUNTER — Ambulatory Visit
Admission: RE | Admit: 2016-08-02 | Discharge: 2016-08-02 | Disposition: A | Discharge status: Home / Self Care | Payer: Medicare Other | Source: Ambulatory Visit | Attending: Family Medicine | Admitting: Family Medicine

## 2016-08-02 ENCOUNTER — Encounter (HOSPITAL_COMMUNITY): Payer: Medicare Other

## 2016-08-02 ENCOUNTER — Other Ambulatory Visit: Payer: Self-pay | Admitting: Family Medicine

## 2016-08-02 ENCOUNTER — Telehealth (HOSPITAL_COMMUNITY): Payer: Self-pay | Admitting: Family Medicine

## 2016-08-02 DIAGNOSIS — M79602 Pain in left arm: Secondary | ICD-10-CM

## 2016-08-02 DIAGNOSIS — M79642 Pain in left hand: Secondary | ICD-10-CM

## 2016-08-02 NOTE — Progress Notes (Signed)
Reviewed home exercise program with pt.  Discussed mode and frequency of exercise, target heart rate range and weather conditions for exercising outdoors.  Also discussed signs and symptoms and when to call Dr./911.  Pt verbalized understanding.  Cleda Mccreedy, [place] ACSM RCEP 08/02/2016 1609

## 2016-08-05 ENCOUNTER — Other Ambulatory Visit (INDEPENDENT_AMBULATORY_CARE_PROVIDER_SITE_OTHER): Payer: Self-pay

## 2016-08-05 ENCOUNTER — Ambulatory Visit (INDEPENDENT_AMBULATORY_CARE_PROVIDER_SITE_OTHER): Payer: Medicare Other | Admitting: Orthopaedic Surgery

## 2016-08-05 DIAGNOSIS — I208 Other forms of angina pectoris: Secondary | ICD-10-CM | POA: Diagnosis not present

## 2016-08-05 DIAGNOSIS — M79645 Pain in left finger(s): Secondary | ICD-10-CM

## 2016-08-05 DIAGNOSIS — M25532 Pain in left wrist: Secondary | ICD-10-CM

## 2016-08-05 NOTE — Progress Notes (Signed)
Office Visit Note   Patient: [name] [name]           Date of Birth: 18-Mar-1945           [name]: [id number] Visit Date: 08/05/2016              Requested by: [name] Late, MD [address] [place],  [id number] PCP: [name] Late, MD   Assessment & Plan: Visit Diagnoses:  1. Pain of left thumb     Plan: Overall impression is left EPL rupture. Urgent MRI ordered to fully characterize. We'll have the patient follow up with me after MRI. Thumb spica brace was given for support.  Thank you for this referral.  Follow-Up Instructions: Return in about 1 week (around 08/12/2016).   Orders:  No orders of the defined types were placed in this encounter.  No orders of the defined types were placed in this encounter.     Procedures: No procedures performed   Clinical Data: No additional findings.   Subjective: No chief complaint on file.   [name] [name] is a very pleasant 71 year old female who comes in today referral from Dr. Sandi [name] for suspected tendon rupture that occurred a couple days ago. Patient was pulling up her hands and felt a pop in her wrist and thumb. She had immediate pain, swelling, bruising. She's been wearing an Ace bandage. She is unable to extend her thumb area and the bruising has significantly improved. She denies any numbness or tingling.    Review of Systems  Constitutional: Negative.   HENT: Negative.   Eyes: Negative.   Respiratory: Negative.   Cardiovascular: Negative.   Endocrine: Negative.   Musculoskeletal: Negative.   Neurological: Negative.   Hematological: Negative.   Psychiatric/Behavioral: Negative.   All other systems reviewed and are negative.    Objective: Vital Signs: There were no vitals taken for this visit.  Physical Exam  Constitutional: She is oriented to person, place, and time. She appears well-developed and well-nourished.  HENT:  Head: Normocephalic and atraumatic.  Eyes: EOM are normal.  Neck: Neck  supple.  Pulmonary/Chest: Effort normal.  Abdominal: Soft.  Neurological: She is alert and oriented to person, place, and time.  Skin: Skin is warm. Capillary refill takes less than 2 seconds.  Psychiatric: She has a normal mood and affect. Her behavior is normal. Judgment and thought content normal.  Nursing note and vitals reviewed.   Ortho Exam Left hand and wrist exam shows tenderness along the EPL tendon from the wrist on proximally. She is able to extend her wrist with good strength and without significant pain. She is also able to extend her second through fifth digits without any issues. She is unable to actively extend her thumb. Specialty Comments:  No specialty comments available.  Imaging: No results found.   PMFS History: Patient Active Problem List   Diagnosis Date Noted  . Pain of left thumb 08/05/2016  . Nocturnal hypoxia 06/02/2016  . Angina decubitus ([place]) 05/22/2015  . Chronic insomnia 05/06/2015  . Common migraine 05/14/2014  . Abnormality of gait 05/14/2014  . Memory change 05/14/2014  . Aneurysm, cerebral, nonruptured 05/14/2014  . Cramp of limb-Left neck 05/10/2014  . Hematemesis with nausea   . Vomiting blood   . Dizziness and giddiness 09/21/2013  . Atypical chest pain 07/18/2013  . Unstable angina ([place]) 04/09/2013  . Carotid artery stenosis, symptomatic 03/20/2013  . Chronic diastolic CHF (congestive heart failure) ([place]) 09/23/2012  . Cerebrovascular disease 07/10/2012  .  Cerebral artery occlusion with cerebral infarction ([place]) 07/10/2012  . Mitral regurgitation 04/15/2012  . TIA (transient ischemic attack) 04/15/2012  . Occlusion and stenosis of carotid artery without mention of cerebral infarction 08/18/2011  . Bariatric surgery status 06/17/2011  . Speech abnormality 03/22/2011  . Dyspnea 02/24/2011  . PAPILLARY MUSCLE DYSFUNCTION, NON-RHEUMATIC 10/09/2008  . UNSPECIFIED VITAMIN D DEFICIENCY 10/24/2007  . MYOCARDIAL INFARCTION, HX OF  10/24/2007  . PERSISTENT VOMITING 10/24/2007  . OSTEOARTHRITIS 10/24/2007  . MIGRAINES, HX OF 10/24/2007  . DM 01/16/2007  . Hyperlipemia 01/16/2007  . Obesity-post failed open gastroplasty 1984  01/16/2007  . OBSTRUCTIVE SLEEP APNEA 01/16/2007  . Essential hypertension 01/16/2007  . Coronary atherosclerosis 01/16/2007  . Asthma 01/16/2007  . GERD 01/16/2007  . VENTRAL HERNIA 01/16/2007   Past Medical History:  Diagnosis Date  . Abnormality of gait 05/14/2014  . Anemia    hx  . Anginal pain ([place])   . Anxiety   . Asthma   . Bundle branch block, left    chronic/notes 07/18/2013  . Cancer [facility]) April 2016   BCC,  Melanoma  . CHF (congestive heart failure) ([place])   . Chronic bronchitis ([place])    "off and on all the time" (07/18/2013)  . Chronic insomnia 05/06/2015  . Chronic kidney disease    frequency, sses dr Jamal Maes every 4 to 6 months  . Chronic lower back pain   . Claustrophobia   . Common migraine 05/14/2014  . Coronary artery disease   . Dysrhythmia   . GERD (gastroesophageal reflux disease)   . H/O hiatal hernia   . Headache    "probably 4 days/wk" (07/18/2013)  . Heart murmur   . Hyperlipidemia   . Hypertension   . Irregular heart beat   . Leg cramps    both legs at times  . Memory change 05/14/2014  . Mental disorder    hx of confusion  . Migraine    "used to have them really bad; kind of eased up now; down to 1 q couple months" (07/18/2013)  . Myocardial infarction ([place])    04/1999, 02/2000, 01/2005; 2011; 2014  . Neuromuscular disorder ([place])    ?  . Obesity 01-2010  . Obstructive sleep apnea    "can't wear machine; I have claustrophobia" (07/18/2013)  . Osteoarthritis    "knees and hands" (07/18/2013)  . Other and unspecified angina pectoris   . Peripheral vascular disease ([facility])    ? numbness, tingling arms and legs  . Pneumonia 2000's   "once"  . PONV (postoperative nausea and vomiting)   . Shortness of breath    with exertion  . Stroke ([place])  03/22/12   right side brain; denies residual on 07/18/2013)  . Stroke [facility]) Oct. 2015   Affected pt.s balance  . Stroke ([place]) 10/2014  . Type II diabetes mellitus ([place])   . Ventral hernia    hx of  . Vomiting    persistent  . Vomiting blood     Family History  Problem Relation Age of Onset  . Heart disease Father        Heart Disease before age 26  . Diabetes Father   . Hyperlipidemia Father   . Hypertension Father   . Heart attack Father   . Deep vein thrombosis Father   . AAA (abdominal aortic aneurysm) Father   . Hypertension Mother   . Deep vein thrombosis Mother   . AAA (abdominal aortic aneurysm) Mother   . Cancer Sister  Ovarian  . Hypertension Sister   . Diabetes Paternal Grandmother   . Heart disease Paternal Uncle   . Diabetes Paternal Uncle   . Heart attack Paternal Grandfather 77       died of MI at 33  . Diabetes Paternal Aunt   . Diabetes Paternal Uncle   . Colon cancer Neg Hx     Past Surgical History:  Procedure Laterality Date  . ANKLE FRACTURE SURGERY Left 1970's  . APPENDECTOMY  1970's   w/hysterectomy  . CARDIAC CATHETERIZATION  10/10/2012   Dr [name].  Marland Kitchen CARDIAC CATHETERIZATION N/A 05/29/2015   Procedure: Right/Left Heart Cath and Coronary/Graft Angiography;  Surgeon: Larey Dresser, MD;  Location: [facility];  Service: Cardiovascular;  Laterality: N/A;  . CAROTID ENDARTERECTOMY Left 03/2013  . CAROTID STENT INSERTION Left 03/20/2013   Procedure: CAROTID STENT INSERTION;  Surgeon: [name], MD;  Location[facility] C[facility] LAB;  Service: Cardiovascular;  Laterality: Left;  internal carotid  . CEREBRAL ANGIOGRAM N/A 04/05/2011   Procedure: CEREBRAL ANGIOGRAM;  Surgeon: Angelia Mould, MD;  Location: [facility] CATH LAB;  Service: Cardiovascular;  Laterality: N/A;  . CHOLECYSTECTOMY  2004  . CORONARY ANGIOPLASTY WITH STENT PLACEMENT  01,02,05,06,07,08,11; 04/24/2013   "I've probably got ~ 10 stents by now" (04/24/2013)  . CORONARY ANGIOPLASTY  WITH STENT PLACEMENT  06/13/2013   "got 4 stents today" (06/13/2013)  . CORONARY ARTERY BYPASS GRAFT  1220/11   "CABG X5"  . ESOPHAGOGASTRODUODENOSCOPY  08/03/2011   Procedure: ESOPHAGOGASTRODUODENOSCOPY (EGD);  Surgeon: Shann Medal, MD;  Location: [facility];  Service: General;  Laterality: N/A;  . ESOPHAGOGASTRODUODENOSCOPY (EGD) WITH PROPOFOL N/A 03/11/2014   Procedure: ESOPHAGOGASTRODUODENOSCOPY (EGD) WITH PROPOFOL;  Surgeon: Lafayette Dragon, MD;  Location: [facility] ENDOSCOPY;  Service: Endoscopy;  Laterality: N/A;  . FRACTURE SURGERY    . gall stone removal  05/2003  . GASTRIC BYPASS  1984   "stapeling"  . HERNIA REPAIR  2004   "in my stomach; had OR on it twice", wire mesh on 1 hernia  . LEFT HEART CATH AND CORS/GRAFTS ANGIOGRAPHY N/A 07/09/2016   Procedure: LEFT HEART CATH AND CORS/GRAFTS ANGIOGRAPHY;  Surgeon: Larey Dresser, MD;  Location: [place] [facility];  Service: Cardiovascular;  Laterality: N/A;  . PERCUTANEOUS CORONARY STENT INTERVENTION (PCI-S) N/A 06/13/2013   Procedure: PERCUTANEOUS CORONARY STENT INTERVENTION (PCI-S);  Surgeon: [name], MD;  Location: [facility] CATH LAB;  Service: Cardiovascular;  Laterality: N/A;  . PERCUTANEOUS STENT INTERVENTION N/A 04/24/2013   Procedure: PERCUTANEOUS STENT INTERVENTION;  Surgeon: [name], MD;  Location: [facility] CATH LAB;  Service: Cardiovascular;  Laterality: N/A;  . ROOT CANAL  10/2000  . TIBIA FRACTURE SURGERY Right 1970's   rods and pins  . TOOTH EXTRACTION    . TOTAL ABDOMINAL HYSTERECTOMY  1970's   w/ appendectomy  . TUMOR REMOVAL  1970's   ovarian   Social History   Occupational History  . retired    Social History Main Topics  . Smoking status: Never Smoker  . Smokeless tobacco: Never Used  . Alcohol use No  . Drug use: No  . Sexual activity: Yes    Birth control/ protection: Surgical     Comment: hysterectomy

## 2016-08-06 ENCOUNTER — Encounter (HOSPITAL_COMMUNITY): Admission: RE | Admit: 2016-08-06 | Payer: Medicare Other | Source: Ambulatory Visit

## 2016-08-06 ENCOUNTER — Telehealth (HOSPITAL_COMMUNITY): Payer: Self-pay | Admitting: *Deleted

## 2016-08-09 ENCOUNTER — Telehealth (INDEPENDENT_AMBULATORY_CARE_PROVIDER_SITE_OTHER): Payer: Self-pay | Admitting: Radiology

## 2016-08-09 ENCOUNTER — Ambulatory Visit (HOSPITAL_COMMUNITY)
Admission: RE | Admit: 2016-08-09 | Discharge: 2016-08-09 | Disposition: A | Discharge status: Home / Self Care | Payer: Medicare Other | Source: Ambulatory Visit | Attending: Orthopaedic Surgery | Admitting: Orthopaedic Surgery

## 2016-08-09 ENCOUNTER — Encounter (HOSPITAL_COMMUNITY)
Admission: RE | Admit: 2016-08-09 | Discharge: 2016-08-09 | Disposition: A | Discharge status: Home / Self Care | Payer: Medicare Other | Source: Ambulatory Visit | Attending: Cardiology | Admitting: Cardiology

## 2016-08-09 DIAGNOSIS — G4734 Idiopathic sleep related nonobstructive alveolar hypoventilation: Secondary | ICD-10-CM | POA: Diagnosis not present

## 2016-08-09 DIAGNOSIS — X58XXXA Exposure to other specified factors, initial encounter: Secondary | ICD-10-CM | POA: Diagnosis not present

## 2016-08-09 DIAGNOSIS — M25532 Pain in left wrist: Secondary | ICD-10-CM

## 2016-08-09 DIAGNOSIS — R0902 Hypoxemia: Secondary | ICD-10-CM | POA: Insufficient documentation

## 2016-08-09 DIAGNOSIS — S66912A Strain of unspecified muscle, fascia and tendon at wrist and hand level, left hand, initial encounter: Secondary | ICD-10-CM | POA: Insufficient documentation

## 2016-08-09 DIAGNOSIS — I208 Other forms of angina pectoris: Secondary | ICD-10-CM

## 2016-08-09 NOTE — Telephone Encounter (Signed)
[name] [name] with [facility] called and states order for MRI Wrist has comments to include the forearm and that they cannot do that. The forearm is a separate order and would have to be precerted with insurance differently.   I spoke with Dr. Erlinda Hong who would like to just get the wrist done. Patient's pain is mostly distal forearm at wrist.   I advised [name] [name] Dr. Erlinda Hong wants to proceed with wrist. She can scan forearm tonight as well but we will have to get precert first. Dr. Erlinda Hong is going to hold off on forearm order.

## 2016-08-10 ENCOUNTER — Ambulatory Visit (HOSPITAL_COMMUNITY)
Admission: RE | Admit: 2016-08-10 | Discharge: 2016-08-10 | Disposition: A | Discharge status: Home / Self Care | Payer: Medicare Other | Source: Ambulatory Visit | Attending: Internal Medicine | Admitting: Internal Medicine

## 2016-08-10 ENCOUNTER — Encounter (HOSPITAL_COMMUNITY): Payer: Self-pay

## 2016-08-10 VITALS — BP 128/78 | HR 80 | Wt 302.8 lb

## 2016-08-10 DIAGNOSIS — Z8249 Family history of ischemic heart disease and other diseases of the circulatory system: Secondary | ICD-10-CM | POA: Insufficient documentation

## 2016-08-10 DIAGNOSIS — E785 Hyperlipidemia, unspecified: Secondary | ICD-10-CM | POA: Diagnosis not present

## 2016-08-10 DIAGNOSIS — I130 Hypertensive heart and chronic kidney disease with heart failure and stage 1 through stage 4 chronic kidney disease, or unspecified chronic kidney disease: Secondary | ICD-10-CM | POA: Diagnosis not present

## 2016-08-10 DIAGNOSIS — E7849 Other hyperlipidemia: Secondary | ICD-10-CM

## 2016-08-10 DIAGNOSIS — Z8673 Personal history of transient ischemic attack (TIA), and cerebral infarction without residual deficits: Secondary | ICD-10-CM | POA: Insufficient documentation

## 2016-08-10 DIAGNOSIS — Z951 Presence of aortocoronary bypass graft: Secondary | ICD-10-CM | POA: Diagnosis not present

## 2016-08-10 DIAGNOSIS — E784 Other hyperlipidemia: Secondary | ICD-10-CM | POA: Diagnosis not present

## 2016-08-10 DIAGNOSIS — I679 Cerebrovascular disease, unspecified: Secondary | ICD-10-CM

## 2016-08-10 DIAGNOSIS — I5032 Chronic diastolic (congestive) heart failure: Secondary | ICD-10-CM | POA: Diagnosis not present

## 2016-08-10 DIAGNOSIS — I10 Essential (primary) hypertension: Secondary | ICD-10-CM | POA: Diagnosis not present

## 2016-08-10 DIAGNOSIS — I6529 Occlusion and stenosis of unspecified carotid artery: Secondary | ICD-10-CM | POA: Diagnosis not present

## 2016-08-10 DIAGNOSIS — N189 Chronic kidney disease, unspecified: Secondary | ICD-10-CM | POA: Insufficient documentation

## 2016-08-10 DIAGNOSIS — K219 Gastro-esophageal reflux disease without esophagitis: Secondary | ICD-10-CM | POA: Diagnosis not present

## 2016-08-10 DIAGNOSIS — R002 Palpitations: Secondary | ICD-10-CM | POA: Diagnosis not present

## 2016-08-10 DIAGNOSIS — E1122 Type 2 diabetes mellitus with diabetic chronic kidney disease: Secondary | ICD-10-CM | POA: Insufficient documentation

## 2016-08-10 DIAGNOSIS — Z6841 Body Mass Index (BMI) 40.0 and over, adult: Secondary | ICD-10-CM | POA: Insufficient documentation

## 2016-08-10 DIAGNOSIS — Z79899 Other long term (current) drug therapy: Secondary | ICD-10-CM | POA: Diagnosis not present

## 2016-08-10 DIAGNOSIS — K449 Diaphragmatic hernia without obstruction or gangrene: Secondary | ICD-10-CM | POA: Diagnosis not present

## 2016-08-10 DIAGNOSIS — Z85820 Personal history of malignant melanoma of skin: Secondary | ICD-10-CM | POA: Insufficient documentation

## 2016-08-10 DIAGNOSIS — Z9889 Other specified postprocedural states: Secondary | ICD-10-CM | POA: Diagnosis not present

## 2016-08-10 DIAGNOSIS — I2510 Atherosclerotic heart disease of native coronary artery without angina pectoris: Secondary | ICD-10-CM | POA: Diagnosis not present

## 2016-08-10 DIAGNOSIS — G4733 Obstructive sleep apnea (adult) (pediatric): Secondary | ICD-10-CM | POA: Insufficient documentation

## 2016-08-10 DIAGNOSIS — Z794 Long term (current) use of insulin: Secondary | ICD-10-CM | POA: Insufficient documentation

## 2016-08-10 DIAGNOSIS — Z7982 Long term (current) use of aspirin: Secondary | ICD-10-CM | POA: Insufficient documentation

## 2016-08-10 DIAGNOSIS — F419 Anxiety disorder, unspecified: Secondary | ICD-10-CM | POA: Insufficient documentation

## 2016-08-10 DIAGNOSIS — I350 Nonrheumatic aortic (valve) stenosis: Secondary | ICD-10-CM | POA: Insufficient documentation

## 2016-08-10 LAB — BASIC METABOLIC PANEL
Anion gap: 10 (ref 5–15)
BUN: 24 mg/dL — ABNORMAL HIGH (ref 6–20)
CO2: 29 mmol/L (ref 22–32)
Calcium: 9.3 mg/dL (ref 8.9–10.3)
Chloride: 102 mmol/L (ref 101–111)
Creatinine, Ser: 1.15 mg/dL — ABNORMAL HIGH (ref 0.44–1.00)
GFR calc Af Amer: 55 mL/min — ABNORMAL LOW (ref >60)
GFR calc non Af Amer: 48 mL/min — ABNORMAL LOW (ref >60)
Glucose, Bld: 219 mg/dL — ABNORMAL HIGH (ref 65–99)
Potassium: 4.9 mmol/L (ref 3.5–5.1)
Sodium: 141 mmol/L (ref 135–145)

## 2016-08-10 MED ORDER — NITROGLYCERIN 0.3 MG SL SUBL: 0.3000 mg | SUBLINGUAL_TABLET | SUBLINGUAL | 1 refills | Status: DC | PRN

## 2016-08-10 NOTE — Progress Notes (Signed)
She needs appt this week to review MRI.  If she can't come in, I can call her tonight to discuss MRI findings.

## 2016-08-10 NOTE — Patient Instructions (Signed)
Routine lab work today. Will notify you of abnormal results, otherwise no news is good news!  No changes to medication at this time.  Follow up with Dr. [name] in 2 months.  Take all medication as prescribed the day of your appointment. Bring all medications with you to your appointment.  Do the following things EVERYDAY: 1) Weigh yourself in the morning before breakfast. Write it down and keep it in a log. 2) Take your medicines as prescribed 3) Eat low salt foods-Limit salt (sodium) to 2000 mg per day.  4) Stay as active as you can everyday 5) Limit all fluids for the day to less than 2 liters

## 2016-08-10 NOTE — Progress Notes (Signed)
Patient ID: [name] [name], female   DOB: [birth year], 71 y.o.   [name]: [id number] PCP: Dr. Sandi Mariscal Cardiology: Dr. [name]  71 yo with history of CAD s/p CABG, diastolic CHF, and cerebrovascular disease with history of CVA presents for cardiology followup. She had CABG x 5 in 12/11.  Prior to the CABG she had multiple PCIs.  She had a CVA in 2/14 that presented as visual loss.  She had a left carotid stent in 2/15.   Left heart cath done in Sept. 2014. This showed patent SVG-D, LIMA-LAD, and sequential SVG-OM branches but SVG-RCA and the native RCA were both totally occluded. It was felt aht her increased symptoms coincided with occlusion of SVG-RCA.  She was started on Imdur to see if this would help with the chest pain and dyspnea.  However, she feels like Imdur causes leg cramps and does not think that she can take it.  So ranolazine 500 mg bid started and titrated this up to 1000 mg bid.  This helped some but not markedly.  Therefore, sent to  Dr. Irish Lack to address opening RCA CTO. He was able to do this in 5/15 with 4 overlapping Xience DES.  This led to resolution of exertional chest pain.    Mrs Bigford was started on Brilinta after PCI.  She became much more short of breath after starting on Brilinta. Per Dr Delano Metz stopped and replaced with Plavix.  Dyspnea improved off Brilinta. Given increasing exertional dyspnea and chest pain,  RHC/LHC in 4/17.  This showed stable CAD with no interventional target.  Left and right heart filling pressures were not significantly increased.  Medical management.   Pt presents today for regular follow up.  Last appointment c/o chest pain. Had [facility] 07/09/16 with 4/5 patent grafts and relatively unchanged from previous cath.  No explanation for CP from CAD perspective. Continues to have occasional chest pain episodes with diaphoresis. Had 3 episodes prior to cath, and has had 4 more since. She is always away from home when they happen. She is lifting a lot  and helping with her mother, who is very sick. SOB walking from parking lot to hear. She ruptured a tendon in her wrist changing clothes. Still having some swelling in her legs. Gone in the mornings, but goes up throughout the day. She also didn't take all of her torsemide this am with her appointments.   MRI 06/2016 that showed Moderately severe stenosis of the supraclinoid segment of the right internal carotid artery, progressed since the 2016 MR angiogram and moderate stenosis of the cavernous, progressed compared to the 2016 MRA, and milder stenosis of the supraclinoid segments of the left internal carotid artery.   LHC 07/09/16 1. 4/5 grafts patent, similar to prior cath 2. Patent native RCA with overlapping stents proximally.   No interventional target. No explanation for chest pain.   Labs (9/16): K 5.3, creatinine 1.09 Labs (10/16): LDL 75, HDL 56 Labs (2/17): K 5.2, creatinine 1.07 Labs (4/17): K 5.1, creatinine 1.07 Labs (5/17): K 4.5, creatinine 1.01, LDL 96, HDL 56, BNP 70 Labs (03/2016) K 4.9, creatinine 1.07  PMH: 1. Diabetic gastroparesis 2. Type II diabetes 3. HTN 4. Morbid obesity 5. CAD: s/p CABG in 12/11 after prior PCIs.  LIMA-LAD, SVG-D, seq SVG-OM1 and OM2, SVG-PDA. Adenosine Cardiolite (8/14) with EF 53% and a small reversible apical defect with a medium, partially reversible inferior defect.  LHC (9/14) with patent SVG-D, LIMA-LAD (50% distal LAD), and  sequential SVG-OM branches; the SVG-RCA and the native RCA were occluded.  This was managed medically initially, but with ongoing exertional chest pain, it was decided to open CTO.  Patient had CTO opening with 4 overlapping Xience DES in the RCA in 5/15.   - LHC (4/17): SVG-D patent, LIMA-LAD patent, distal LAD with several 40-50% stenoses, sequential SVG-OM1 and PLOM patent with 50% proximal stenosis (not flow limiting), SVG-RCA TO with patent RCA stents.  6. Atypical migraines 7. OSA: Intolerant of CPAP.  8. GERD with  hiatal hernia 9. OA 10. Diastolic CHF: Echo (14/43) with EF 50-55%, grade II diastolic dysfunction, mild-moderate MR.  Echo (8/14) with EF 55-60%, grade II diastolic dysfunction, mildly increased aortic valve gradient (mean 12 mmHg) but valve opens well, mild MR and mild RV dilation.  Echo (9/15) with EF 60-65%, grade II diastolic dysfunction, mild aortic stenosis, mild mitral stenosis, mild to moderate mitral regurgitation, mildly dilated RV with normal systolic function, PA systolic pressure 46 mmHg.  - RHC (4/17): mean RA 9, PA 38/15 mean 27, mean PCWP 9, CI 2.5.  - Echo (5/17): EF 55-60%, mild LVH, mildly dilated RV with low normal systolic function, moderate TR, PASP 61 mmHg 11. CKD 12. Chronic LBBB 13. Anxiety 14. Carotid stenosis: Followed by [facility], >15% LICA stenosis 40/08.  She had left carotid stent in 2/15. Carotid dopplers 8/15 with no significant disease. Carotid dopplers (4/16): < 40% RICA stenosis.  15. Cerebrovascular disease: CVA 2/14 with right posterior cerebral artery territory ischemic infarction. Cerebral angiogram in 6/14 showed 70% right vertebral ostial stenosis, 67-61% LICA stenosis, > 95% proximal left posterior cerebral artery stenosis, posterior communicating artery aneurysm.  Patient has had episodes of transient expressive aphasia.  Carotid dopplers (09/32) showed >67% LICA stenosis. She had a left carotid stent 2/15.  Possible CVA in 7/15.  16. Positional vertigo (suspected) 17. Palpitations: Holter (6/15) with rare PVCs/PACs.  18. Dyspnea with Brilinta. 19. Melanoma: On face, s/p excision.   20. Aortic stenosis: Mild.  21. Holter (8/16) with no significant arrhythmias.  22. Lower extremity arterial dopplers (9/16) were normal.   SH: Married, nonsmoker  FH: CAD  Review of systems complete and found to be negative unless listed in HPI.    Current Outpatient Prescriptions  Medication Sig Dispense Refill  . acetaminophen (TYLENOL) 325 MG tablet Take 650 mg by  mouth every 6 (six) hours as needed for mild pain.     Marland Kitchen albuterol (PROVENTIL) (2.5 MG/3ML) 0.083% nebulizer solution Take 2.5 mg by nebulization every 6 (six) hours as needed for shortness of breath (when asthma is active).     Marland Kitchen albuterol (PROVENTIL,VENTOLIN) 90 MCG/ACT inhaler Inhale 2 puffs into the lungs every 6 (six) hours as needed for shortness of breath.     . Alirocumab (PRALUENT) 150 MG/ML SOPN Inject 1 pen into the skin every 14 (fourteen) days. 6 pen 3  . ALPRAZolam (XANAX) 0.5 MG tablet Take 0.5-1 mg by mouth 3 (three) times daily. 0.5mg  in the morning and at lunch, 1mg  at bedtime    . amLODipine (NORVASC) 10 MG tablet Take 10 mg by mouth at bedtime.     Marland Kitchen aspirin EC 81 MG tablet Take 162 mg by mouth daily.    Marland Kitchen b complex vitamins capsule Take 1 capsule by mouth daily.    Marland Kitchen buPROPion (WELLBUTRIN XL) 150 MG 24 hr tablet Take 1 tablet by mouth daily.    . Calcium Carbonate 1500 (600 CA) MG TABS Take 1,500 mg by  mouth daily with breakfast.    . Cholecalciferol (VITAMIN D) 2000 UNITS tablet Take 2,000 Units by mouth daily with breakfast.     . cloNIDine (CATAPRES) 0.2 MG tablet Take 0.2 mg by mouth 2 (two) times daily.    . clopidogrel (PLAVIX) 75 MG tablet TAKE ONE (1) TABLET BY MOUTH EVERY DAY 90 tablet 3  . denosumab (PROLIA) 60 MG/ML SOLN injection Inject 60 mg into the skin every 6 (six) months. Administer in upper arm, thigh, or abdomen    . DULoxetine (CYMBALTA) 30 MG capsule Take 30 mg by mouth daily with breakfast.     . ezetimibe (ZETIA) 10 MG tablet TAKE ONE (1) TABLET EACH DAY 90 tablet 3  . Fluticasone-Salmeterol (ADVAIR DISKUS) 250-50 MCG/DOSE AEPB Inhale 1 puff into the lungs 2 (two) times daily.     Marland Kitchen gabapentin (NEURONTIN) 600 MG tablet Take 600-1,200 mg by mouth 3 (three) times daily. 600mg  in the morning and at noon. 1200 mg at bedtime.    Marland Kitchen HUMALOG KWIKPEN 100 UNIT/ML SOPN Inject 18-26 Units into the skin 4 (four) times daily -  with meals and at bedtime. 10 units at  every meal and 14 units at bedtime if needed    . HYDROcodone-acetaminophen (NORCO/VICODIN) 5-325 MG per tablet Take 1 tablet by mouth at bedtime as needed for moderate pain.     Marland Kitchen ipratropium-albuterol (DUONEB) 0.5-2.5 (3) MG/3ML SOLN Take 3 mLs by nebulization as needed (If severe asthma , use every 4 hours for 10 days).     Marland Kitchen LANTUS SOLOSTAR 100 UNIT/ML Solostar Pen Inject 48-52 Units into the skin every morning. Can increase or decrease by 4 units depending on blood sugar levels    . loperamide (IMODIUM) 2 MG capsule Take 2 mg by mouth as needed for diarrhea or loose stools.    Marland Kitchen losartan (COZAAR) 100 MG tablet Take 100 mg by mouth daily with breakfast.     . magnesium oxide (MAG-OX) 400 MG tablet Take 400 mg by mouth 2 (two) times daily.    . meclizine (ANTIVERT) 12.5 MG tablet Take 12.5 mg by mouth 3 (three) times daily as needed for dizziness.    . methocarbamol (ROBAXIN) 500 MG tablet TAKE ONE (1) TABLET THREE (3) TIMES EACH DAY 60 tablet 1  . metoprolol succinate (TOPROL-XL) 25 MG 24 hr tablet TAKE ONE TABLET TWICE DAILY 180 tablet 3  . Multiple Vitamin (MULTIVITAMIN) capsule Take 1 capsule by mouth daily.    . multivitamin-lutein (OCUVITE-LUTEIN) CAPS capsule Take 1 capsule by mouth daily.    . ondansetron (ZOFRAN) 4 MG tablet Take 1 tablet (4 mg total) by mouth 2 (two) times daily as needed. For nausea (Patient taking differently: Take 4 mg by mouth 2 (two) times daily. For nausea) 20 tablet 0  . OXYGEN Inhale into the lungs.    . pantoprazole (PROTONIX) 40 MG tablet Take 1 tablet (40 mg total) by mouth 2 (two) times daily. 180 tablet 0  . RANEXA 1000 MG SR tablet TAKE ONE (1) TABLET BY MOUTH TWO (2) TIMES DAILY 60 each 6  . rosuvastatin (CRESTOR) 40 MG tablet TAKE ONE (1) TABLET EACH DAY 90 tablet 6  . torsemide (DEMADEX) 20 MG tablet Take 4 tablets (80 mg total) by mouth daily. 120 tablet 3  . triamterene-hydrochlorothiazide (DYAZIDE) 37.5-25 MG capsule Take 1 capsule by mouth daily.     . vitamin B-12 (CYANOCOBALAMIN) 1000 MCG tablet Take 1,000 mcg by mouth daily.    Marland Kitchen zinc gluconate  50 MG tablet Take 50 mg by mouth at bedtime.     Marland Kitchen zolpidem (AMBIEN) 10 MG tablet Take 10 mg by mouth at bedtime.     . nitroGLYCERIN (NITROSTAT) 0.3 MG SL tablet Place 1 tablet (0.3 mg total) under the tongue every 5 (five) minutes x 3 doses as needed for chest pain. (Patient not taking: Reported on 08/10/2016) 90 tablet 1   No current facility-administered medications for this encounter.     BP 128/78 (BP Location: Right Arm, Patient Position: Sitting, Cuff Size: Normal)   Pulse 80   Wt (!) 302 lb 12.8 oz (137.3 kg)   SpO2 94%   BMI 56.29 kg/m    Wt Readings from Last 3 Encounters:  08/10/16 (!) 302 lb 12.8 oz (137.3 kg)  07/09/16 289 lb (131.1 kg)  07/08/16 298 lb 12.8 oz (135.5 kg)     PHYSICAL EXAM General: Obese, female. No resp difficulty. HEENT: Normal Neck: Supple. JVP 7-8. Carotids 2+ bilat; no bruits. No thyromegaly or nodule noted. Cor: PMI nondisplaced. RRR, No M/G/R noted Lungs: CTAB, normal effort. Abdomen: Soft, non-tender, non-distended, no HSM. No bruits or masses. +BS  Extremities: No cyanosis, clubbing, or rash. 1+ pedal edema.   Neuro: Alert & orientedx3, cranial nerves grossly intact. moves all 4 extremities w/o difficulty. Affect pleasant   Assessment/Plan: 1. CAD: Occluded native RCA and SVG-RCA.  Now status post opening of CTO RCA with 4 overlapping Xience DES.  Most recent cath in 4/17 showed patent RCA stents, no interventional target.  - Continue ASA + Plavix.    - Has not tolerated Imdur in the past.  - LHC 07/09/16 with 4/5 grafts patent, similar to prior cath and Patent native RCA with overlapping stents proximally.  - Continues to have atypical symptoms. ? If related to anxiety with her mothers poor health (considering placing in SNF).  2. Chronic diastolic CHF:   - NYHA II-III - Volume status stable on exam.  - Continue cardiac rehab.  -  Continue torsemide 80mg  daily. BMET today.  - Reinforced fluid restriction to < 2 L daily, sodium restriction to less than 2000 mg daily, and the importance of daily weights.   3. Hyperlipidemia: - Followed at the lipid clinic.  - Continue Praluent  - LDL 97.  4. Hypertension:  - Well controlled on current regimen.     5. Cerebrovascular disease: She has history of CVA and had left carotid stent in 2/15. -  Followed by [facility]  - Recent MRI with worsening R carotid disease - She has follow up with [facility] in July. No change.  6. Palpitations:  - Continue Toprol XL. Can consider increasing if increased frequency.  7. Suspected sleep apnea. -  Negative Sleep study done 05/2016  Shirley Friar, PA-C  08/10/2016

## 2016-08-10 NOTE — Progress Notes (Signed)
Advanced Heart Failure Medication Review by a Pharmacist  Does the patient  feel that his/her medications are working for him/her?  yes  Has the patient been experiencing any side effects to the medications prescribed?  no  Does the patient measure his/her own blood pressure or blood glucose at home?  yes   Does the patient have any problems obtaining medications due to transportation or finances?   no  Understanding of regimen: good Understanding of indications: good Potential of compliance: good Patient understands to avoid NSAIDs. Patient understands to avoid decongestants.  Issues to address at subsequent visits: None   Pharmacist comments: Ms. Ashlock is a pleasant 70 yo F presenting with a very thorough medication list and medical history. She reports good compliance with her regimen and did not have any specific medication-related questions or concerns for me at this time.   Ruta Hinds. Velva Harman, PharmD, BCPS, CPP Clinical Pharmacist Pager: [phone] Phone: [phone] 08/10/2016 2:02 PM      Time with patient: 10 minutes Preparation and documentation time: 2 minutes Total time: 12 minutes

## 2016-08-11 ENCOUNTER — Telehealth (HOSPITAL_COMMUNITY): Payer: Self-pay | Admitting: Family Medicine

## 2016-08-11 ENCOUNTER — Encounter (HOSPITAL_COMMUNITY): Payer: Medicare Other

## 2016-08-13 ENCOUNTER — Encounter (HOSPITAL_COMMUNITY)
Admission: RE | Admit: 2016-08-13 | Discharge: 2016-08-13 | Disposition: A | Discharge status: Home / Self Care | Payer: Medicare Other | Source: Ambulatory Visit | Attending: Cardiology | Admitting: Cardiology

## 2016-08-13 ENCOUNTER — Ambulatory Visit (INDEPENDENT_AMBULATORY_CARE_PROVIDER_SITE_OTHER): Payer: Medicare Other | Admitting: Orthopaedic Surgery

## 2016-08-13 ENCOUNTER — Encounter (INDEPENDENT_AMBULATORY_CARE_PROVIDER_SITE_OTHER): Payer: Self-pay | Admitting: Orthopaedic Surgery

## 2016-08-13 DIAGNOSIS — M66842 Spontaneous rupture of other tendons, left hand: Secondary | ICD-10-CM

## 2016-08-13 DIAGNOSIS — I208 Other forms of angina pectoris: Secondary | ICD-10-CM | POA: Diagnosis not present

## 2016-08-13 DIAGNOSIS — R0902 Hypoxemia: Secondary | ICD-10-CM | POA: Diagnosis not present

## 2016-08-13 NOTE — Progress Notes (Signed)
Office Visit Note   Patient: [name] [name]           Date of Birth: 1945/05/23           [name]: [id number] Visit Date: 08/13/2016              Requested by: Derinda Late, MD [address] [place], [place] [address] PCP: Derinda Late, MD   Assessment & Plan: Visit Diagnoses:  1. Nontraumatic rupture of tendon of left thumb     Plan: MRI shows rupture of her EPL tendon. Urgent referral to Dr. Grandville Silos at [facility].  Follow-Up Instructions: Return if symptoms worsen or fail to improve.   Orders:  Orders Placed This Encounter  Procedures  . Ambulatory referral to Orthopedic Surgery   No orders of the defined types were placed in this encounter.     Procedures: No procedures performed   Clinical Data: No additional findings.   Subjective: Chief Complaint  Patient presents with  . Left Wrist - Pain, Follow-up    [name] [name] is here today to review her MRI.    Review of Systems   Objective: Vital Signs: There were no vitals taken for this visit.  Physical Exam  Ortho Exam Left thumb exam is stable. Specialty Comments:  No specialty comments available.  Imaging: No results found.   PMFS History: Patient Active Problem List   Diagnosis Date Noted  . Pain of left thumb 08/05/2016  . Nocturnal hypoxia 06/02/2016  . Angina decubitus ([place]) 05/22/2015  . Chronic insomnia 05/06/2015  . Common migraine 05/14/2014  . Abnormality of gait 05/14/2014  . Memory change 05/14/2014  . Aneurysm, cerebral, nonruptured 05/14/2014  . Cramp of limb-Left neck 05/10/2014  . Hematemesis with nausea   . Vomiting blood   . Dizziness and giddiness 09/21/2013  . Atypical chest pain 07/18/2013  . Unstable angina ([place]) 04/09/2013  . Carotid artery stenosis, symptomatic 03/20/2013  . Chronic diastolic CHF (congestive heart failure) ([place]) 09/23/2012  . Cerebrovascular disease 07/10/2012  . Cerebral artery occlusion with cerebral infarction ([place]) 07/10/2012   . Mitral regurgitation 04/15/2012  . TIA (transient ischemic attack) 04/15/2012  . Occlusion and stenosis of carotid artery without mention of cerebral infarction 08/18/2011  . Bariatric surgery status 06/17/2011  . Speech abnormality 03/22/2011  . Dyspnea 02/24/2011  . PAPILLARY MUSCLE DYSFUNCTION, NON-RHEUMATIC 10/09/2008  . UNSPECIFIED VITAMIN D DEFICIENCY 10/24/2007  . MYOCARDIAL INFARCTION, HX OF 10/24/2007  . PERSISTENT VOMITING 10/24/2007  . OSTEOARTHRITIS 10/24/2007  . MIGRAINES, HX OF 10/24/2007  . DM 01/16/2007  . Hyperlipemia 01/16/2007  . Obesity-post failed open gastroplasty 1984  01/16/2007  . OBSTRUCTIVE SLEEP APNEA 01/16/2007  . Essential hypertension 01/16/2007  . Coronary atherosclerosis 01/16/2007  . Asthma 01/16/2007  . GERD 01/16/2007  . VENTRAL HERNIA 01/16/2007   Past Medical History:  Diagnosis Date  . Abnormality of gait 05/14/2014  . Anemia    hx  . Anginal pain ([place])   . Anxiety   . Asthma   . Bundle branch block, left    chronic/notes 07/18/2013  . Cancer [facility]) April 2016   BCC,  Melanoma  . CHF (congestive heart failure) ([place])   . Chronic bronchitis ([place])    "off and on all the time" (07/18/2013)  . Chronic insomnia 05/06/2015  . Chronic kidney disease    frequency, sses dr Jamal Maes every 4 to 6 months  . Chronic lower back pain   . Claustrophobia   . Common migraine 05/14/2014  .  Coronary artery disease   . Dysrhythmia   . GERD (gastroesophageal reflux disease)   . H/O hiatal hernia   . Headache    "probably 4 days/wk" (07/18/2013)  . Heart murmur   . Hyperlipidemia   . Hypertension   . Irregular heart beat   . Leg cramps    both legs at times  . Memory change 05/14/2014  . Mental disorder    hx of confusion  . Migraine    "used to have them really bad; kind of eased up now; down to 1 q couple months" (07/18/2013)  . Myocardial infarction ([place])    04/1999, 02/2000, 01/2005; 2011; 2014  . Neuromuscular disorder ([place])    ?  .  Obesity 01-2010  . Obstructive sleep apnea    "can't wear machine; I have claustrophobia" (07/18/2013)  . Osteoarthritis    "knees and hands" (07/18/2013)  . Other and unspecified angina pectoris   . Peripheral vascular disease ([facility])    ? numbness, tingling arms and legs  . Pneumonia 2000's   "once"  . PONV (postoperative nausea and vomiting)   . Shortness of breath    with exertion  . Stroke ([place]) 03/22/12   right side brain; denies residual on 07/18/2013)  . Stroke [facility]) Oct. 2015   Affected pt.s balance  . Stroke ([place]) 10/2014  . Type II diabetes mellitus ([place])   . Ventral hernia    hx of  . Vomiting    persistent  . Vomiting blood     Family History  Problem Relation Age of Onset  . Heart disease Father        Heart Disease before age 62  . Diabetes Father   . Hyperlipidemia Father   . Hypertension Father   . Heart attack Father   . Deep vein thrombosis Father   . AAA (abdominal aortic aneurysm) Father   . Hypertension Mother   . Deep vein thrombosis Mother   . AAA (abdominal aortic aneurysm) Mother   . Cancer Sister        Ovarian  . Hypertension Sister   . Diabetes Paternal Grandmother   . Heart disease Paternal Uncle   . Diabetes Paternal Uncle   . Heart attack Paternal Grandfather 59       died of MI at 61  . Diabetes Paternal Aunt   . Diabetes Paternal Uncle   . Colon cancer Neg Hx     Past Surgical History:  Procedure Laterality Date  . ANKLE FRACTURE SURGERY Left 1970's  . APPENDECTOMY  1970's   w/hysterectomy  . CARDIAC CATHETERIZATION  10/10/2012   Dr [name].  Marland Kitchen CARDIAC CATHETERIZATION N/A 05/29/2015   Procedure: Right/Left Heart Cath and Coronary/Graft Angiography;  Surgeon: Larey Dresser, MD;  Location: [facility];  Service: Cardiovascular;  Laterality: N/A;  . CAROTID ENDARTERECTOMY Left 03/2013  . CAROTID STENT INSERTION Left 03/20/2013   Procedure: CAROTID STENT INSERTION;  Surgeon: [name], MD;  Location: [facility] CATH LAB;  Service:  Cardiovascular;  Laterality: Left;  internal carotid  . CEREBRAL ANGIOGRAM N/A 04/05/2011   Procedure: CEREBRAL ANGIOGRAM;  Surgeon: Angelia Mould, MD;  Location: [facility] CATH LAB;  Service: Cardiovascular;  Laterality: N/A;  . CHOLECYSTECTOMY  2004  . CORONARY ANGIOPLASTY WITH STENT PLACEMENT  01,02,05,06,07,08,11; 04/24/2013   "I've probably got ~ 10 stents by now" (04/24/2013)  . CORONARY ANGIOPLASTY WITH STENT PLACEMENT  06/13/2013   "got 4 stents today" (06/13/2013)  . CORONARY ARTERY BYPASS GRAFT  1220/11   "CABG X5"  . ESOPHAGOGASTRODUODENOSCOPY  08/03/2011   Procedure: ESOPHAGOGASTRODUODENOSCOPY (EGD);  Surgeon: Shann Medal, MD;  Location: [facility];  Service: General;  Laterality: N/A;  . ESOPHAGOGASTRODUODENOSCOPY (EGD) WITH PROPOFOL N/A 03/11/2014   Procedure: ESOPHAGOGASTRODUODENOSCOPY (EGD) WITH PROPOFOL;  Surgeon: Lafayette Dragon, MD;  Location: [facility] ENDOSCOPY;  Service: Endoscopy;  Laterality: N/A;  . FRACTURE SURGERY    . gall stone removal  05/2003  . GASTRIC BYPASS  1984   "stapeling"  . HERNIA REPAIR  2004   "in my stomach; had OR on it twice", wire mesh on 1 hernia  . LEFT HEART CATH AND CORS/GRAFTS ANGIOGRAPHY N/A 07/09/2016   Procedure: LEFT HEART CATH AND CORS/GRAFTS ANGIOGRAPHY;  Surgeon: Larey Dresser, MD;  Location: [facility];  Service: Cardiovascular;  Laterality: N/A;  . PERCUTANEOUS CORONARY STENT INTERVENTION (PCI-S) N/A 06/13/2013   Procedure: PERCUTANEOUS CORONARY STENT INTERVENTION (PCI-S);  Surgeon: [name], MD;  Location: [facility] CATH LAB;  Service: Cardiovascular;  Laterality: N/A;  . PERCUTANEOUS STENT INTERVENTION N/A 04/24/2013   Procedure: PERCUTANEOUS STENT INTERVENTION;  Surgeon: [name], MD;  Location: [facility] CATH LAB;  Service: Cardiovascular;  Laterality: N/A;  . ROOT CANAL  10/2000  . TIBIA FRACTURE SURGERY Right 1970's   rods and pins  . TOOTH EXTRACTION    . TOTAL ABDOMINAL HYSTERECTOMY  1970's   w/ appendectomy  . TUMOR  REMOVAL  1970's   ovarian   Social History   Occupational History  . retired    Social History Main Topics  . Smoking status: Never Smoker  . Smokeless tobacco: Never Used  . Alcohol use No  . Drug use: No  . Sexual activity: Yes    Birth control/ protection: Surgical     Comment: hysterectomy

## 2016-08-16 ENCOUNTER — Encounter (HOSPITAL_COMMUNITY)
Admission: RE | Admit: 2016-08-16 | Discharge: 2016-08-16 | Disposition: A | Discharge status: Home / Self Care | Payer: Medicare Other | Source: Ambulatory Visit | Attending: Cardiology | Admitting: Cardiology

## 2016-08-16 DIAGNOSIS — R0902 Hypoxemia: Secondary | ICD-10-CM | POA: Diagnosis not present

## 2016-08-16 DIAGNOSIS — I208 Other forms of angina pectoris: Secondary | ICD-10-CM

## 2016-08-16 DIAGNOSIS — I2089 Other forms of angina pectoris: Secondary | ICD-10-CM

## 2016-08-16 NOTE — Progress Notes (Signed)
Cardiac Individual Treatment Plan  Patient Details  Name: [name] [name] [name]: [id number] Date of Birth: 06/07/1945 Referring Provider:     [facility] from 06/17/2016 in [place]  Referring Provider  Loralie Champagne MD      Initial Encounter Date:    CARDIAC REHAB PHASE II ORIENTATION from 06/17/2016 in [place]  Date  06/17/16  Referring Provider  Loralie Champagne MD      Visit Diagnosis: Stable angina [facility])  Patient's Home Medications on Admission:  Current Outpatient Prescriptions:  .  acetaminophen (TYLENOL) 325 MG tablet, Take 650 mg by mouth every 6 (six) hours as needed for mild pain. , Disp: , Rfl:  .  albuterol (PROVENTIL) (2.5 MG/3ML) 0.083% nebulizer solution, Take 2.5 mg by nebulization every 6 (six) hours as needed for shortness of breath (when asthma is active). , Disp: , Rfl:  .  albuterol (PROVENTIL,VENTOLIN) 90 MCG/ACT inhaler, Inhale 2 puffs into the lungs every 6 (six) hours as needed for shortness of breath. , Disp: , Rfl:  .  Alirocumab (PRALUENT) 150 MG/ML SOPN, Inject 1 pen into the skin every 14 (fourteen) days., Disp: 6 pen, Rfl: 3 .  ALPRAZolam (XANAX) 0.5 MG tablet, Take 0.5-1 mg by mouth 3 (three) times daily. 0.5mg  in the morning and at lunch, 1mg  at bedtime, Disp: , Rfl:  .  amLODipine (NORVASC) 10 MG tablet, Take 10 mg by mouth at bedtime. , Disp: , Rfl:  .  aspirin EC 81 MG tablet, Take 162 mg by mouth daily., Disp: , Rfl:  .  b complex vitamins capsule, Take 1 capsule by mouth daily., Disp: , Rfl:  .  buPROPion (WELLBUTRIN XL) 150 MG 24 hr tablet, Take 1 tablet by mouth daily., Disp: , Rfl:  .  Calcium Carbonate 1500 (600 CA) MG TABS, Take 1,500 mg by mouth daily with breakfast., Disp: , Rfl:  .  Cholecalciferol (VITAMIN D) 2000 UNITS tablet, Take 2,000 Units by mouth daily with breakfast. , Disp: , Rfl:  .  cloNIDine (CATAPRES) 0.2 MG tablet, Take 0.2 mg by mouth 2  (two) times daily., Disp: , Rfl:  .  clopidogrel (PLAVIX) 75 MG tablet, TAKE ONE (1) TABLET BY MOUTH EVERY DAY, Disp: 90 tablet, Rfl: 3 .  denosumab (PROLIA) 60 MG/ML SOLN injection, Inject 60 mg into the skin every 6 (six) months. Administer in upper arm, thigh, or abdomen, Disp: , Rfl:  .  DULoxetine (CYMBALTA) 30 MG capsule, Take 30 mg by mouth daily with breakfast. , Disp: , Rfl:  .  ezetimibe (ZETIA) 10 MG tablet, TAKE ONE (1) TABLET EACH DAY, Disp: 90 tablet, Rfl: 3 .  Fluticasone-Salmeterol (ADVAIR DISKUS) 250-50 MCG/DOSE AEPB, Inhale 1 puff into the lungs 2 (two) times daily. , Disp: , Rfl:  .  gabapentin (NEURONTIN) 600 MG tablet, Take 600-1,200 mg by mouth 3 (three) times daily. 600mg  in the morning and at noon. 1200 mg at bedtime., Disp: , Rfl:  .  HUMALOG KWIKPEN 100 UNIT/ML SOPN, Inject 18-26 Units into the skin 4 (four) times daily -  with meals and at bedtime. 10 units at every meal and 14 units at bedtime if needed, Disp: , Rfl:  .  HYDROcodone-acetaminophen (NORCO/VICODIN) 5-325 MG per tablet, Take 1 tablet by mouth at bedtime as needed for moderate pain. , Disp: , Rfl:  .  ipratropium-albuterol (DUONEB) 0.5-2.5 (3) MG/3ML SOLN, Take 3 mLs by nebulization as needed (If severe asthma ,  use every 4 hours for 10 days). , Disp: , Rfl:  .  LANTUS SOLOSTAR 100 UNIT/ML Solostar Pen, Inject 48-52 Units into the skin every morning. Can increase or decrease by 4 units depending on blood sugar levels, Disp: , Rfl:  .  loperamide (IMODIUM) 2 MG capsule, Take 2 mg by mouth as needed for diarrhea or loose stools., Disp: , Rfl:  .  losartan (COZAAR) 100 MG tablet, Take 100 mg by mouth daily with breakfast. , Disp: , Rfl:  .  magnesium oxide (MAG-OX) 400 MG tablet, Take 400 mg by mouth 2 (two) times daily., Disp: , Rfl:  .  meclizine (ANTIVERT) 12.5 MG tablet, Take 12.5 mg by mouth 3 (three) times daily as needed for dizziness., Disp: , Rfl:  .  methocarbamol (ROBAXIN) 500 MG tablet, TAKE ONE (1)  TABLET THREE (3) TIMES EACH DAY, Disp: 60 tablet, Rfl: 1 .  metoprolol succinate (TOPROL-XL) 25 MG 24 hr tablet, TAKE ONE TABLET TWICE DAILY, Disp: 180 tablet, Rfl: 3 .  Multiple Vitamin (MULTIVITAMIN) capsule, Take 1 capsule by mouth daily., Disp: , Rfl:  .  multivitamin-lutein (OCUVITE-LUTEIN) CAPS capsule, Take 1 capsule by mouth daily., Disp: , Rfl:  .  nitroGLYCERIN (NITROSTAT) 0.3 MG SL tablet, Place 1 tablet (0.3 mg total) under the tongue every 5 (five) minutes x 3 doses as needed for chest pain., Disp: 90 tablet, Rfl: 1 .  ondansetron (ZOFRAN) 4 MG tablet, Take 1 tablet (4 mg total) by mouth 2 (two) times daily as needed. For nausea (Patient taking differently: Take 4 mg by mouth 2 (two) times daily. For nausea), Disp: 20 tablet, Rfl: 0 .  OXYGEN, Inhale into the lungs., Disp: , Rfl:  .  pantoprazole (PROTONIX) 40 MG tablet, Take 1 tablet (40 mg total) by mouth 2 (two) times daily., Disp: 180 tablet, Rfl: 0 .  RANEXA 1000 MG SR tablet, TAKE ONE (1) TABLET BY MOUTH TWO (2) TIMES DAILY, Disp: 60 each, Rfl: 6 .  rosuvastatin (CRESTOR) 40 MG tablet, TAKE ONE (1) TABLET EACH DAY, Disp: 90 tablet, Rfl: 6 .  torsemide (DEMADEX) 20 MG tablet, Take 4 tablets (80 mg total) by mouth daily., Disp: 120 tablet, Rfl: 3 .  triamterene-hydrochlorothiazide (DYAZIDE) 37.5-25 MG capsule, Take 1 capsule by mouth daily., Disp: , Rfl:  .  vitamin B-12 (CYANOCOBALAMIN) 1000 MCG tablet, Take 1,000 mcg by mouth daily., Disp: , Rfl:  .  zinc gluconate 50 MG tablet, Take 50 mg by mouth at bedtime. , Disp: , Rfl:  .  zolpidem (AMBIEN) 10 MG tablet, Take 10 mg by mouth at bedtime. , Disp: , Rfl:   Past Medical History: Past Medical History:  Diagnosis Date  . Abnormality of gait 05/14/2014  . Anemia    hx  . Anginal pain ([place])   . Anxiety   . Asthma   . Bundle branch block, left    chronic/notes 07/18/2013  . Cancer [facility]) April 2016   BCC,  Melanoma  . CHF (congestive heart failure) ([place])   . Chronic  bronchitis ([place])    "off and on all the time" (07/18/2013)  . Chronic insomnia 05/06/2015  . Chronic kidney disease    frequency, sses dr Jamal Maes every 4 to 6 months  . Chronic lower back pain   . Claustrophobia   . Common migraine 05/14/2014  . Coronary artery disease   . Dysrhythmia   . GERD (gastroesophageal reflux disease)   . H/O hiatal hernia   . Headache    "  probably 4 days/wk" (07/18/2013)  . Heart murmur   . Hyperlipidemia   . Hypertension   . Irregular heart beat   . Leg cramps    both legs at times  . Memory change 05/14/2014  . Mental disorder    hx of confusion  . Migraine    "used to have them really bad; kind of eased up now; down to 1 q couple months" (07/18/2013)  . Myocardial infarction ([place])    04/1999, 02/2000, 01/2005; 2011; 2014  . Neuromuscular disorder ([place])    ?  . Obesity 01-2010  . Obstructive sleep apnea    "can't wear machine; I have claustrophobia" (07/18/2013)  . Osteoarthritis    "knees and hands" (07/18/2013)  . Other and unspecified angina pectoris   . Peripheral vascular disease (H[facility])    ? numbness, tingling arms and legs  . Pneumonia 2000's   "once"  . PONV (postoperative nausea and vomiting)   . Shortness of breath    with exertion  . Stroke ([place]) 03/22/12   right side brain; denies residual on 07/18/2013)  . Stroke [facility]) Oct. 2015   Affected pt.s balance  . Stroke ([place]) 10/2014  . Type II diabetes mellitus ([place])   . Ventral hernia    hx of  . Vomiting    persistent  . Vomiting blood     Tobacco Use: History  Smoking Status  . Never Smoker  Smokeless Tobacco  . Never Used    Labs: Recent Review Flowsheet Data    Labs for ITP Cardiac and Pulmonary Rehab Latest Ref Rng & Units 05/15/2014 11/13/2014 05/29/2015 06/24/2015 03/12/2016   Cholestrol 0 - 200 mg/dL 141 164 - 183 189   LDLCALC 0 - 99 mg/dL 50 75 - 96 97   LDLDIRECT mg/dL - - - - -   HDL >40 mg/dL 55.60 56 - 56 56   Trlycerides <150 mg/dL 179.0(H) 163(H) - 156(H)  181(H)   Hemoglobin A1c <5.7 % - - - - -   PHART 7.350 - 7.400 - - - - -   PCO2ART 35.0 - 45.0 mmHg - - - - -   HCO3 20.0 - 24.0 mEq/L - - 30.3(H) - -   TCO2 0 - 100 mmol/L - - 32 - -   ACIDBASEDEF 0.0 - 2.0 mmol/L - - - - -   O2SAT % - - 70.0 - -      Capillary Blood Glucose: Lab Results  Component Value Date   GLUCAP 98 07/09/2016   GLUCAP 256 (H) 07/09/2016   GLUCAP 200 (H) 07/02/2016   GLUCAP 151 (H) 06/30/2016   GLUCAP 248 (H) 06/30/2016     Exercise Target Goals:    Exercise Program Goal: Individual exercise prescription set with THRR, safety & activity barriers. Participant demonstrates ability to understand and report RPE using BORG scale, to self-measure pulse accurately, and to acknowledge the importance of the exercise prescription.  Exercise Prescription Goal: Starting with aerobic activity 30 plus minutes a day, 3 days per week for initial exercise prescription. Provide home exercise prescription and guidelines that participant acknowledges understanding prior to discharge.  Activity Barriers & Risk Stratification:     Activity Barriers & Cardiac Risk Stratification - 06/17/16 0837      Activity Barriers & Cardiac Risk Stratification   Cardiac Risk Stratification High      6 Minute Walk:     6 Minute Walk    Row Name 06/17/16 1117  6 Minute Walk   Phase Initial     Distance 463 feet     Walk Time 6 minutes     # of Rest Breaks 0     MPH 0.9     METS 0.6     RPE 17     VO2 Peak 2.1     Symptoms No     Resting HR 76 bpm     Resting BP 140/70     Max Ex. HR 96 bpm     Max Ex. BP 208/96     2 Minute Post BP 165/65        Oxygen Initial Assessment:   Oxygen Re-Evaluation:   Oxygen Discharge (Final Oxygen Re-Evaluation):   Initial Exercise Prescription:     Initial Exercise Prescription - 06/17/16 1100      Date of Initial Exercise RX and Referring Provider   Date 06/17/16   Referring Provider Loralie Champagne MD      NuStep   Level 1   SPM 80   Minutes 20   METs 1.8     Arm Ergometer   Level 1   Watts 25   Minutes 10   METs 1.9     Prescription Details   Frequency (times per week) 3   Duration Progress to 45 minutes of aerobic exercise without signs/symptoms of physical distress     Intensity   THRR 40-80% of Max Heartrate 61-122   Ratings of Perceived Exertion 11-13   Perceived Dyspnea 0-4     Progression   Progression Continue to progress workloads to maintain intensity without signs/symptoms of physical distress.     Resistance Training   Training Prescription Yes   Weight 2   Reps 10-15      Perform Capillary Blood Glucose checks as needed.  Exercise Prescription Changes:      Exercise Prescription Changes    Row Name 07/15/16 0800 08/16/16 1100           Response to Exercise   Blood Pressure (Admit) (P)  118/74 121/84      Blood Pressure (Exercise) (P)  152/80 114/64      Blood Pressure (Exit) (P)  142/80 102/72      Heart Rate (Admit) (P)  68 bpm 69 bpm      Heart Rate (Exercise) (P)  83 bpm 86 bpm      Heart Rate (Exit) (P)  50 bpm 61 bpm      Rating of Perceived Exertion (Exercise) (P)  11 12      Duration (P)  Progress to 45 minutes of aerobic exercise without signs/symptoms of physical distress Progress to 45 minutes of aerobic exercise without signs/symptoms of physical distress      Intensity (P)  THRR unchanged THRR unchanged        Progression   Progression (P)  Continue to progress workloads to maintain intensity without signs/symptoms of physical distress. Continue to progress workloads to maintain intensity without signs/symptoms of physical distress.      Average METs  - 1.7        Resistance Training   Training Prescription (P)  Yes Yes      Weight (P)  2  -      Reps (P)  10-15 10-15        NuStep   Level (P)  1 1      SPM (P)  80 80      Minutes (P)  20 20  METs (P)  1.8 1.8        Arm Ergometer   Level (P)  1 1      Watts (P)  25 25       Minutes (P)  10 10      METs (P)  1.9 1.9        Home Exercise Plan   Plans to continue exercise at  - Home (comment)      Frequency  - Add 3 additional days to program exercise sessions.         Exercise Comments:      Exercise Comments    Row Name 06/23/16 1117 07/19/16 1636 08/16/16 1118       Exercise Comments Pt is off to a good start with exercise! Reviewed METs and goals with pt.  Reviewed goals with pt.          Exercise Goals and Review:      Exercise Goals    Row Name 06/17/16 0837             Exercise Goals   Increase Physical Activity Yes  increase functional mobility       Intervention Provide advice, education, support and counseling about physical activity/exercise needs.;Develop an individualized exercise prescription for aerobic and resistive training based on initial evaluation findings, risk stratification, comorbidities and participant's personal goals.       Expected Outcomes Achievement of increased cardiorespiratory fitness and enhanced flexibility, muscular endurance and strength shown through measurements of functional capacity and personal statement of participant.       Increase Strength and Stamina Yes  develop an exercise routine, learn exercise limitations       Intervention Develop an individualized exercise prescription for aerobic and resistive training based on initial evaluation findings, risk stratification, comorbidities and participant's personal goals.;Provide advice, education, support and counseling about physical activity/exercise needs.       Expected Outcomes Achievement of increased cardiorespiratory fitness and enhanced flexibility, muscular endurance and strength shown through measurements of functional capacity and personal statement of participant.          Exercise Goals Re-Evaluation :     Exercise Goals Re-Evaluation    Row Name 07/19/16 1634 08/16/16 1116           Exercise Goal Re-Evaluation   Exercise  Goals Review Increase Physical Activity;Increase Strenth and Stamina Increase Physical Activity;Increase Strenth and Stamina      Comments Pt has returned to rehab after being out for a few days due to her catherization, but she continues to do well with exercise.  Pt states that she feels stronger but she doesn't feel she is where she is supposed to be because of her injury.        Expected Outcomes Continue with exercise prescription and increase workload as tolerated in order to increase exercise tolerance.  Continue with exercise prescription and increase workload as tolerated in order to increase exercise tolerance.           Discharge Exercise Prescription (Final Exercise Prescription Changes):     Exercise Prescription Changes - 08/16/16 1100      Response to Exercise   Blood Pressure (Admit) 121/84   Blood Pressure (Exercise) 114/64   Blood Pressure (Exit) 102/72   Heart Rate (Admit) 69 bpm   Heart Rate (Exercise) 86 bpm   Heart Rate (Exit) 61 bpm   Rating of Perceived Exertion (Exercise) 12   Duration Progress to 45 minutes of aerobic exercise without signs/symptoms  of physical distress   Intensity THRR unchanged     Progression   Progression Continue to progress workloads to maintain intensity without signs/symptoms of physical distress.   Average METs 1.7     Resistance Training   Training Prescription Yes   Reps 10-15     NuStep   Level 1   SPM 80   Minutes 20   METs 1.8     Arm Ergometer   Level 1   Watts 25   Minutes 10   METs 1.9     Home Exercise Plan   Plans to continue exercise at Home (comment)   Frequency Add 3 additional days to program exercise sessions.      Nutrition:  Target Goals: Understanding of nutrition guidelines, daily intake of sodium 1500mg , cholesterol 200mg , calories 30% from fat and 7% or less from saturated fats, daily to have 5 or more servings of fruits and vegetables.  Biometrics:     Pre Biometrics - 06/17/16 1223       Pre Biometrics   Height 5' 2.25" (1.581 m)   Weight (!)  302 lb 11.1 oz (137.3 kg)   Hip Circumference 64 inches   BMI (Calculated) 55   Triceps Skinfold 70 mm   % Body Fat 65.1 %   Grip Strength 28 kg   Flexibility 8 in   Single Leg Stand 1.03 seconds       Nutrition Therapy Plan and Nutrition Goals:     Nutrition Therapy & Goals - 06/30/16 1140      Nutrition Therapy   Diet Carb Modified, Therapeutic Lifestyle Changes     Personal Nutrition Goals   Nutrition Goal Wt loss of 1-2 lb/week to a wt loss goal of 6-24 lb at graduation from [facility], educate and counsel regarding individualized specific dietary modifications aiming towards targeted core components such as weight, hypertension, lipid management, diabetes, heart failure and other comorbidities.   Expected Outcomes Short Term Goal: Understand basic principles of dietary content, such as calories, fat, sodium, cholesterol and nutrients.;Long Term Goal: Adherence to prescribed nutrition plan.      Nutrition Discharge: Nutrition Scores:     Nutrition Assessments - 06/30/16 1142      MEDFICTS Scores   Pre Score 6      Nutrition Goals Re-Evaluation:   Nutrition Goals Re-Evaluation:   Nutrition Goals Discharge (Final Nutrition Goals Re-Evaluation):   Psychosocial: Target Goals: Acknowledge presence or absence of significant depression and/or stress, maximize coping skills, provide positive support system. Participant is able to verbalize types and ability to use techniques and skills needed for reducing stress and depression.  Initial Review & Psychosocial Screening:     Initial Psych Review & Screening - 06/17/16 1221      Initial Review   Current issues with History of Depression     Family Dynamics   Good Support System? Yes     Barriers   Psychosocial barriers to participate in program The patient should benefit from training in stress  management and relaxation.     Screening Interventions   Interventions Encouraged to exercise;To provide support and resources with identified psychosocial needs;Provide feedback about the scores to participant      Quality of Life Scores:     Quality of Life - 07/05/16 1752      Quality of Life Scores   Health/Function Pre --  Mailyn is dissatisfied with her health due to her  CAD and diabetes   Psych/Spiritual Pre --  Appointment set up with [facility] hospital chaplain for couselling.        PHQ-9: Recent Review Flowsheet Data    Depression screen [facility] 2/9 06/23/2016 06/24/2015 04/14/2015 02/04/2015 11/25/2014   Decreased Interest 0 1 1 1 1    Down, Depressed, Hopeless 1  1 1 1 1    PHQ - 2 Score 1 2 2 2 2    Altered sleeping - - - - 2   Tired, decreased energy - - - - 3   Change in appetite - - - - 3    Feeling bad or failure about yourself  - - - - 3   Trouble concentrating - - - - 3   Moving slowly or fidgety/restless - - - - 0   Suicidal thoughts - - - - 0   PHQ-9 Score - - - - 16   Difficult doing work/chores - - - - Somewhat difficult     Interpretation of Total Score  Total Score Depression Severity:  1-4 = Minimal depression, 5-9 = Mild depression, 10-14 = Moderate depression, 15-19 = Moderately severe depression, 20-27 = Severe depression   Psychosocial Evaluation and Intervention:   Psychosocial Re-Evaluation:     Psychosocial Re-Evaluation    Row Name 07/19/16 1629 07/19/16 1634 08/16/16 1240         Psychosocial Re-Evaluation   Current issues with History of Depression  - History of Depression     Comments  - -  Adrea will be meeting with the hospital chaplian in the near future Caitlynn depression is currently managed     Interventions Stress management education;Encouraged to attend Cardiac Rehabilitation for the exercise  - Stress management education;Encouraged to attend Cardiac Rehabilitation for the exercise     Continue Psychosocial Services   -   - Follow up required by staff        Psychosocial Discharge (Final Psychosocial Re-Evaluation):     Psychosocial Re-Evaluation - 08/16/16 1240      Psychosocial Re-Evaluation   Current issues with History of Depression   Comments Honore depression is currently managed   Interventions Stress management education;Encouraged to attend Cardiac Rehabilitation for the exercise   Continue Psychosocial Services  Follow up required by staff      Vocational Rehabilitation: Provide vocational rehab assistance to qualifying candidates.   Vocational Rehab Evaluation & Intervention:     Vocational Rehab - 06/17/16 1221      Initial Vocational Rehab Evaluation & Intervention   Assessment shows need for Vocational Rehabilitation No      Education: Education Goals: Education classes will be provided on a weekly basis, covering required topics. Participant will state understanding/return demonstration of topics presented.  Learning Barriers/Preferences:     Learning Barriers/Preferences - 06/17/16 0835      Learning Barriers/Preferences   Learning Barriers Sight   Learning Preferences Skilled Demonstration      Education Topics: Count Your Pulse:  -Group instruction provided by verbal instruction, demonstration, patient participation and written materials to support subject.  Instructors address importance of being able to find your pulse and how to count your pulse when at home without a heart monitor.  Patients get hands on experience counting their pulse with staff help and individually.   Heart Attack, Angina, and Risk Factor Modification:  -Group instruction provided by verbal instruction, video, and written materials to support subject.  Instructors address signs and symptoms of angina and heart attacks.  Also discuss risk factors for heart disease and how to make changes to improve heart health risk factors.   CARDIAC REHAB PHASE II EXERCISE from 08/13/2016 in [place]  Date  06/23/16  Instruction Review Code  2- meets goals/outcomes      Functional Fitness:  -Group instruction provided by verbal instruction, demonstration, patient participation, and written materials to support subject.  Instructors address safety measures for doing things around the house.  Discuss how to get up and down off the floor, how to pick things up properly, how to safely get out of a chair without assistance, and balance training.   Meditation and Mindfulness:  -Group instruction provided by verbal instruction, patient participation, and written materials to support subject.  Instructor addresses importance of mindfulness and meditation practice to help reduce stress and improve awareness.  Instructor also leads participants through a meditation exercise.    CARDIAC REHAB PHASE II EXERCISE from 08/13/2016 in [place]  Date  07/07/16  Instruction Review Code  2- meets goals/outcomes      Stretching for Flexibility and Mobility:  -Group instruction provided by verbal instruction, patient participation, and written materials to support subject.  Instructors lead participants through series of stretches that are designed to increase flexibility thus improving mobility.  These stretches are additional exercise for major muscle groups that are typically performed during regular warm up and cool down.   CARDIAC REHAB PHASE II EXERCISE from 08/13/2016 in [place]  Date  07/23/16  Instruction Review Code  2- meets goals/outcomes      Hands Only CPR:  -Group verbal, video, and participation provides a basic overview of AHA guidelines for community CPR. Role-play of emergencies allow participants the opportunity to practice calling for help and chest compression technique with discussion of AED use.   Hypertension: -Group verbal and written instruction that provides a basic overview of  hypertension including the most recent diagnostic guidelines, risk factor reduction with self-care instructions and medication management.    Nutrition I class: Heart Healthy Eating:  -Group instruction provided by PowerPoint slides, verbal discussion, and written materials to support subject matter. The instructor gives an explanation and review of the Therapeutic Lifestyle Changes diet recommendations, which includes a discussion on lipid goals, dietary fat, sodium, fiber, plant stanol/sterol esters, sugar, and the components of a well-balanced, healthy diet.   CARDIAC REHAB PHASE II EXERCISE from 08/13/2016 in [place]  Date  07/07/16  Educator  RD  Instruction Review Code  Not applicable [class handouts given]      Nutrition II class: Lifestyle Skills:  -Group instruction provided by PowerPoint slides, verbal discussion, and written materials to support subject matter. The instructor gives an explanation and review of label reading, grocery shopping for heart health, heart healthy recipe modifications, and ways to make healthier choices when eating out.   CARDIAC REHAB PHASE II EXERCISE from 08/13/2016 in [place]  Date  07/07/16  Educator  RD  Instruction Review Code  Not applicable [class handouts given]      Diabetes Question & Answer:  -Group instruction provided by PowerPoint slides, verbal discussion, and written materials to support subject matter. The instructor gives an explanation and review of diabetes co-morbidities, pre- and post-prandial blood glucose goals, pre-exercise blood glucose goals, signs, symptoms, and treatment of hypoglycemia and hyperglycemia, and foot care basics.   CARDIAC REHAB PHASE II EXERCISE  from 08/13/2016 in [place]  Date  08/13/16  Educator  RD  Instruction Review Code  2- meets goals/outcomes      Diabetes Blitz:  -Group instruction provided by  PowerPoint slides, verbal discussion, and written materials to support subject matter. The instructor gives an explanation and review of the physiology behind type 1 and type 2 diabetes, diabetes medications and rational behind using different medications, pre- and post-prandial blood glucose recommendations and Hemoglobin A1c goals, diabetes diet, and exercise including blood glucose guidelines for exercising safely.    CARDIAC REHAB PHASE II EXERCISE from 08/13/2016 in [place]  Date  07/07/16  Educator  RD  Instruction Review Code  Not applicable [class handouts given]      Portion Distortion:  -Group instruction provided by PowerPoint slides, verbal discussion, written materials, and food models to support subject matter. The instructor gives an explanation of serving size versus portion size, changes in portions sizes over the last 20 years, and what consists of a serving from each food group.   CARDIAC REHAB PHASE II EXERCISE from 08/13/2016 in [place]  Date  07/21/16  Educator  RD  Instruction Review Code  2- meets goals/outcomes      Stress Management:  -Group instruction provided by verbal instruction, video, and written materials to support subject matter.  Instructors review role of stress in heart disease and how to cope with stress positively.     CARDIAC REHAB PHASE II EXERCISE from 08/13/2016 in [place]  Date  07/28/16  Instruction Review Code  2- meets goals/outcomes      Exercising on Your Own:  -Group instruction provided by verbal instruction, power point, and written materials to support subject.  Instructors discuss benefits of exercise, components of exercise, frequency and intensity of exercise, and end points for exercise.  Also discuss use of nitroglycerin and activating EMS.  Review options of places to exercise outside of rehab.  Review guidelines for sex with  heart disease.   Cardiac Drugs I:  -Group instruction provided by verbal instruction and written materials to support subject.  Instructor reviews cardiac drug classes: antiplatelets, anticoagulants, beta blockers, and statins.  Instructor discusses reasons, side effects, and lifestyle considerations for each drug class.   Cardiac Drugs II:  -Group instruction provided by verbal instruction and written materials to support subject.  Instructor reviews cardiac drug classes: angiotensin converting enzyme inhibitors (ACE-I), angiotensin II receptor blockers (ARBs), nitrates, and calcium channel blockers.  Instructor discusses reasons, side effects, and lifestyle considerations for each drug class.   Anatomy and Physiology of the Circulatory System:  Group verbal and written instruction and models provide basic cardiac anatomy and physiology, with the coronary electrical and arterial systems. Review of: AMI, Angina, Valve disease, Heart Failure, Peripheral Artery Disease, Cardiac Arrhythmia, Pacemakers, and the ICD.   CARDIAC REHAB PHASE II EXERCISE from 08/13/2016 in [place]  Date  06/30/16  Instruction Review Code  2- meets goals/outcomes      Other Education:  -Group or individual verbal, written, or video instructions that support the educational goals of the cardiac rehab program.   Knowledge Questionnaire Score:     Knowledge Questionnaire Score - 06/17/16 1115      Knowledge Questionnaire Score   Pre Score 23/24      Core Components/Risk Factors/Patient Goals at Admission:     Personal Goals and Risk Factors  at Admission - 06/17/16 0854      Core Components/Risk Factors/Patient Goals on Admission    Weight Management Yes;Obesity;Weight Maintenance;Weight Loss   Intervention Weight Management: Develop a combined nutrition and exercise program designed to reach desired caloric intake, while maintaining appropriate intake of nutrient and  fiber, sodium and fats, and appropriate energy expenditure required for the weight goal.;Weight Management: Provide education and appropriate resources to help participant work on and attain dietary goals.;Weight Management/Obesity: Establish reasonable short term and long term weight goals.;Obesity: Provide education and appropriate resources to help participant work on and attain dietary goals.   Expected Outcomes Long Term: Adherence to nutrition and physical activity/exercise program aimed toward attainment of established weight goal;Short Term: Continue to assess and modify interventions until short term weight is achieved;Weight Maintenance: Understanding of the daily nutrition guidelines, which includes 25-35% calories from fat, 7% or less cal from saturated fats, less than 200mg  cholesterol, less than 1.5gm of sodium, & 5 or more servings of fruits and vegetables daily;Weight Loss: Understanding of general recommendations for a balanced deficit meal plan, which promotes 1-2 lb weight loss per week and includes a negative energy balance of [phone] kcal/d;Understanding recommendations for meals to include 15-35% energy as protein, 25-35% energy from fat, 35-60% energy from carbohydrates, less than 200mg  of dietary cholesterol, 20-35 gm of total fiber daily;Understanding of distribution of calorie intake throughout the day with the consumption of 4-5 meals/snacks   Improve shortness of breath with ADL's Yes   Intervention Provide education, individualized exercise plan and daily activity instruction to help decrease symptoms of SOB with activities of daily living.   Expected Outcomes Short Term: Achieves a reduction of symptoms when performing activities of daily living.   Diabetes Yes   Intervention Provide education about signs/symptoms and action to take for hypo/hyperglycemia.;Provide education about proper nutrition, including hydration, and aerobic/resistive exercise prescription along with  prescribed medications to achieve blood glucose in normal ranges: Fasting glucose 65-99 mg/dL   Expected Outcomes Short Term: Participant verbalizes understanding of the signs/symptoms and immediate care of hyper/hypoglycemia, proper foot care and importance of medication, aerobic/resistive exercise and nutrition plan for blood glucose control.;Long Term: Attainment of HbA1C < 7%.   Heart Failure Yes   Intervention Provide a combined exercise and nutrition program that is supplemented with education, support and counseling about heart failure. Directed toward relieving symptoms such as shortness of breath, decreased exercise tolerance, and extremity edema.   Expected Outcomes Improve functional capacity of life;Short term: Attendance in program 2-3 days a week with increased exercise capacity. Reported lower sodium intake. Reported increased fruit and vegetable intake. Reports medication compliance.;Short term: Daily weights obtained and reported for increase. Utilizing diuretic protocols set by physician.;Long term: Adoption of self-care skills and reduction of barriers for early signs and symptoms recognition and intervention leading to self-care maintenance.      Core Components/Risk Factors/Patient Goals Review:    Core Components/Risk Factors/Patient Goals at Discharge (Final Review):    ITP Comments:     ITP Comments    Row Name 06/17/16 0826           ITP Comments Medical Director, Dr. Fransico Him          Comments: Birtha is making expected progress toward personal goals after completing 17 sessions. Recommend continued exercise and life style modification education including  stress management and relaxation techniques to decrease cardiac risk profile. Monigue has been wearing a brace on left wrist and forearm. Cornie continues to do well  will exercise and has not been using her left arm during exercise.Barnet Pall, RN,BSN 08/17/2016 2:08 PM

## 2016-08-18 ENCOUNTER — Other Ambulatory Visit: Payer: Medicare Other

## 2016-08-18 ENCOUNTER — Ambulatory Visit (HOSPITAL_COMMUNITY): Payer: Medicare Other | Attending: Cardiology

## 2016-08-18 ENCOUNTER — Encounter (HOSPITAL_COMMUNITY)
Admission: RE | Admit: 2016-08-18 | Discharge: 2016-08-18 | Disposition: A | Discharge status: Home / Self Care | Payer: Medicare Other | Source: Ambulatory Visit | Attending: Cardiology | Admitting: Cardiology

## 2016-08-18 ENCOUNTER — Telehealth (HOSPITAL_COMMUNITY): Payer: Self-pay | Admitting: *Deleted

## 2016-08-18 DIAGNOSIS — I208 Other forms of angina pectoris: Secondary | ICD-10-CM

## 2016-08-18 DIAGNOSIS — R0902 Hypoxemia: Secondary | ICD-10-CM | POA: Diagnosis not present

## 2016-08-18 DIAGNOSIS — G4734 Idiopathic sleep related nonobstructive alveolar hypoventilation: Secondary | ICD-10-CM | POA: Diagnosis not present

## 2016-08-18 LAB — GLUCOSE, CAPILLARY: Glucose-Capillary: 181 mg/dL — ABNORMAL HIGH (ref 65–99)

## 2016-08-18 NOTE — Progress Notes (Signed)
Incomplete Session Note  Patient Details  Name: [name] [name] [name]: [id number] Date of Birth: 1945-04-24 Referring Provider:     CARDIAC REHAB PHASE II ORIENTATION from 06/17/2016 in [place]  Referring Provider  Loralie Champagne MD      [name] did not complete her rehab session.  [name] reported that she has had some intermittent chest discomfort since yesterday. Exercise stopped. 12 lead ECG obtained. [name] Blood pressure 108/72, heart rate 82. Dr Claris Gladden office called and notified. Spoke with Oceanographer. Dr [name] reviewed [name]'s 12 lead and said that no changes were noted. No new orders were received. As [name] went to the back of the gym, [name] was noted to stumble and reported feeling lightheaded.  [name] reported that she had not ate breakfast this morning. CBG 181. Patient given water. recheck sitting blood pressure 118/72 sitting. Standing blood pressure 132/72. After resting and given a banana. [name] felt better and denied having any further dizziness upon discharge from cardiac rehab. [name] has an appointment with the hand surgeon this afternoon.Barnet Pall, RN,BSN 08/18/2016 5:04 PM

## 2016-08-19 ENCOUNTER — Other Ambulatory Visit (HOSPITAL_COMMUNITY): Payer: Self-pay | Admitting: Cardiology

## 2016-08-20 ENCOUNTER — Ambulatory Visit (HOSPITAL_COMMUNITY): Payer: Medicare Other | Attending: Cardiology

## 2016-08-20 ENCOUNTER — Encounter (HOSPITAL_COMMUNITY)
Admission: RE | Admit: 2016-08-20 | Discharge: 2016-08-20 | Disposition: A | Discharge status: Home / Self Care | Payer: Medicare Other | Source: Ambulatory Visit | Attending: Cardiology | Admitting: Cardiology

## 2016-08-20 ENCOUNTER — Telehealth (HOSPITAL_COMMUNITY): Payer: Self-pay | Admitting: *Deleted

## 2016-08-20 DIAGNOSIS — G4734 Idiopathic sleep related nonobstructive alveolar hypoventilation: Secondary | ICD-10-CM | POA: Diagnosis not present

## 2016-08-20 DIAGNOSIS — R0902 Hypoxemia: Secondary | ICD-10-CM | POA: Diagnosis not present

## 2016-08-20 DIAGNOSIS — I208 Other forms of angina pectoris: Secondary | ICD-10-CM

## 2016-08-20 DIAGNOSIS — I5022 Chronic systolic (congestive) heart failure: Secondary | ICD-10-CM

## 2016-08-20 NOTE — Telephone Encounter (Signed)
[facility] called and asked Dr. [name] to review EKG. EKG reviewed and Dr. [name] has ordered patient to wear a 2 week event monitor and may continue with CR as long as she is asymptomatic and HR is normal at rest. Spoke with [name] [name] in CR and she is aware and no further questions.

## 2016-08-20 NOTE — Progress Notes (Signed)
Pt in today for cardiac rehab.  Pt feeling frustrated because she was running late to rehab.  On the monitor pt with HR 140 and irregular with wide complex.  bp 144/80.  Pt with complaints of feeling sweaty and baseline shortness of breath.  O2 sat 955 on 2lnc. 12 lead ekg  Called.  Called heart failure clinic to advise of this morning event.  Strips and 12 lead ekg sent by Rise Paganini, heart and vasuclar ekg tech for review.  Dr. [name] is expected in the clinic momentarily. Cherre Huger, BSN Cardiac and Training and development officer

## 2016-08-23 ENCOUNTER — Encounter (HOSPITAL_COMMUNITY): Payer: Medicare Other

## 2016-08-24 ENCOUNTER — Ambulatory Visit (INDEPENDENT_AMBULATORY_CARE_PROVIDER_SITE_OTHER): Payer: Medicare Other | Admitting: Neurology

## 2016-08-24 ENCOUNTER — Encounter: Payer: Self-pay | Admitting: Neurology

## 2016-08-24 VITALS — BP 131/47 | HR 61 | Ht 61.5 in | Wt 300.0 lb

## 2016-08-24 DIAGNOSIS — I208 Other forms of angina pectoris: Secondary | ICD-10-CM

## 2016-08-24 DIAGNOSIS — G8929 Other chronic pain: Secondary | ICD-10-CM | POA: Diagnosis not present

## 2016-08-24 DIAGNOSIS — M545 Low back pain, unspecified: Secondary | ICD-10-CM | POA: Insufficient documentation

## 2016-08-24 DIAGNOSIS — I679 Cerebrovascular disease, unspecified: Secondary | ICD-10-CM

## 2016-08-24 MED ORDER — METHOCARBAMOL 500 MG PO TABS
ORAL_TABLET | ORAL | 4 refills | Status: DC
Start: 2016-08-24 — End: 2018-04-10

## 2016-08-24 MED ORDER — DULOXETINE HCL 30 MG PO CPEP: 30.0000 mg | ORAL_CAPSULE | Freq: Two times a day (BID) | ORAL | 2 refills | Status: DC

## 2016-08-24 NOTE — Progress Notes (Signed)
Reason for visit: Low back pain  [name] [name] is an 71 y.o. female  History of present illness:  [name] is a 71 year old right-handed white female with a history of morbid obesity, diabetes, and low back pain. The patient was to be set up for an epidural steroid injection, but this was never done. The patient in the interim has developed some cardiac issues, she has had episodes of heart rate in the 140 range, she will be set up for a heart monitor. The patient is in cardiac rehabilitation. The patient has not had any falls, she does not use a cane for ambulation, but she does have some gait instability. She has back pain and pain down both legs. She has undergone MRA of the head, this has shown progression of her cerebrovascular disease with significant stenosis of the proximal basilar artery. She has carotid artery atherosclerosis and involvement of the middle cerebral artery, left greater than right as well. The patient is on Plavix therapy. She returns for an evaluation. She is on gabapentin and Cymbalta for her back, the Cymbalta is in low dose taking only 30 mg daily.    Past Medical History:  Diagnosis Date  . Abnormality of gait 05/14/2014  . Anemia    hx  . Anginal pain ([place])   . Anxiety   . Asthma   . Bundle branch block, left    chronic/notes 07/18/2013  . Cancer [facility]) April 2016   BCC,  Melanoma  . CHF (congestive heart failure) ([place])   . Chronic bronchitis ([place])    "off and on all the time" (07/18/2013)  . Chronic insomnia 05/06/2015  . Chronic kidney disease    frequency, sses dr Jamal Maes every 4 to 6 months  . Chronic lower back pain   . Claustrophobia   . Common migraine 05/14/2014  . Coronary artery disease   . Dysrhythmia   . GERD (gastroesophageal reflux disease)   . H/O hiatal hernia   . Headache    "probably 4 days/wk" (07/18/2013)  . Heart murmur   . Hyperlipidemia   . Hypertension   . Irregular heart beat   . Leg cramps    both legs at times    . Memory change 05/14/2014  . Mental disorder    hx of confusion  . Migraine    "used to have them really bad; kind of eased up now; down to 1 q couple months" (07/18/2013)  . Myocardial infarction ([place])    04/1999, 02/2000, 01/2005; 2011; 2014  . Neuromuscular disorder ([place])    ?  . Obesity 01-2010  . Obstructive sleep apnea    "can't wear machine; I have claustrophobia" (07/18/2013)  . Osteoarthritis    "knees and hands" (07/18/2013)  . Other and unspecified angina pectoris   . Peripheral vascular disease (H[facility])    ? numbness, tingling arms and legs  . Pneumonia 2000's   "once"  . PONV (postoperative nausea and vomiting)   . Shortness of breath    with exertion  . Stroke ([place]) 03/22/12   right side brain; denies residual on 07/18/2013)  . Stroke [facility]) Oct. 2015   Affected pt.s balance  . Stroke ([place]) 10/2014  . Type II diabetes mellitus ([place])   . Ventral hernia    hx of  . Vomiting    persistent  . Vomiting blood     Past Surgical History:  Procedure Laterality Date  . ANKLE FRACTURE SURGERY Left 1970's  . APPENDECTOMY  1970's   w/hysterectomy  . CARDIAC CATHETERIZATION  10/10/2012   Dr [name].  Marland Kitchen CARDIAC CATHETERIZATION N/A 05/29/2015   Procedure: Right/Left Heart Cath and Coronary/Graft Angiography;  Surgeon: Larey Dresser, MD;  Location: [facility];  Service: Cardiovascular;  Laterality: N/A;  . CAROTID ENDARTERECTOMY Left 03/2013  . CAROTID STENT INSERTION Left 03/20/2013   Procedure: CAROTID STENT INSERTION;  Surgeon: [name], MD;  Location: [facility] CATH LAB;  Service: Cardiovascular;  Laterality: Left;  internal carotid  . CEREBRAL ANGIOGRAM N/A 04/05/2011   Procedure: CEREBRAL ANGIOGRAM;  Surgeon: Angelia Mould, MD;  Location: [facility] CATH LAB;  Service: Cardiovascular;  Laterality: N/A;  . CHOLECYSTECTOMY  2004  . CORONARY ANGIOPLASTY WITH STENT PLACEMENT  01,02,05,06,07,08,11; 04/24/2013   "I've probably got ~ 10 stents by now" (04/24/2013)  . CORONARY  ANGIOPLASTY WITH STENT PLACEMENT  06/13/2013   "got 4 stents today" (06/13/2013)  . CORONARY ARTERY BYPASS GRAFT  1220/11   "CABG X5"  . ESOPHAGOGASTRODUODENOSCOPY  08/03/2011   Procedure: ESOPHAGOGASTRODUODENOSCOPY (EGD);  Surgeon: Shann Medal, MD;  Location: [facility];  Service: General;  Laterality: N/A;  . ESOPHAGOGASTRODUODENOSCOPY (EGD) WITH PROPOFOL N/A 03/11/2014   Procedure: ESOPHAGOGASTRODUODENOSCOPY (EGD) WITH PROPOFOL;  Surgeon: Lafayette Dragon, MD;  Location: [facility] ENDOS[facility];  Service: Endoscopy;  Laterality: N/A;  . FRACTURE SURGERY    . gall stone removal  05/2003  . GASTRIC BYPASS  1984   "stapeling"  . HERNIA REPAIR  2004   "in my stomach; had OR on it twice", wire mesh on 1 hernia  . LEFT HEART CATH AND CORS/GRAFTS ANGIOGRAPHY N/A 07/09/2016   Procedure: LEFT HEART CATH AND CORS/GRAFTS ANGIOGRAPHY;  Surgeon: Larey Dresser, MD;  Location: [place];  Service: Cardiovascular;  Laterality: N/A;  . PERCUTANEOUS CORONARY STENT INTERVENTION (PCI-S) N/A 06/13/2013   Procedure: PERCUTANEOUS CORONARY STENT INTERVENTION (PCI-S);  Surgeon: [name], MD;  Location: [facility] C[facility] LAB;  Service: Cardiovascular;  Laterality: N/A;  . PERCUTANEOUS STENT INTERVENTION N/A 04/24/2013   Procedure: PERCUTANEOUS STENT INTERVENTION;  Surgeon: [name], MD;  Location: [facility] CATH LAB;  Service: Cardiovascular;  Laterality: N/A;  . ROOT CANAL  10/2000  . TIBIA FRACTURE SURGERY Right 1970's   rods and pins  . TOOTH EXTRACTION    . TOTAL ABDOMINAL HYSTERECTOMY  1970's   w/ appendectomy  . TUMOR REMOVAL  1970's   ovarian    Family History  Problem Relation Age of Onset  . Heart disease Father        Heart Disease before age 71  . Diabetes Father   . Hyperlipidemia Father   . Hypertension Father   . Heart attack Father   . Deep vein thrombosis Father   . AAA (abdominal aortic aneurysm) Father   . Hypertension Mother   . Deep vein thrombosis Mother   . AAA (abdominal aortic  aneurysm) Mother   . Cancer Sister        Ovarian  . Hypertension Sister   . Diabetes Paternal Grandmother   . Heart disease Paternal Uncle   . Diabetes Paternal Uncle   . Heart attack Paternal Grandfather 41       died of MI at 66  . Diabetes Paternal Aunt   . Diabetes Paternal Uncle   . Colon cancer Neg Hx     Social history:  reports that she has never smoked. She has never used smokeless tobacco. She reports that she does not drink alcohol or use  drugs.    Allergies  Allergen Reactions  . Amoxicillin Shortness Of Breath and Rash  . Brilinta [Ticagrelor] Shortness Of Breath  . Erythromycin Shortness Of Breath, Other (See Comments) and Hives    Trouble swallowing  . Flagyl [Metronidazole] Palpitations and Shortness Of Breath  . Penicillins Shortness Of Breath, Rash and Hives  . Isosorbide Mononitrate [Isosorbide Nitrate] Other (See Comments)    Joint aches, muscles hurt, difficult to walk   . Jardiance [Empagliflozin] Other (See Comments)    Nausea, joint aches, muscles aches  . Metformin And Related Other (See Comments)    Stomach pain, cold sweats, joint pain, burred vision, dizziness  . Tape Other (See Comments)    Skin pulls off with certain types Plastic tape causes skin to rip if left on for long periods of time  . Erythromycin Base Rash    Medications:  Prior to Admission medications   Medication Sig Start Date End Date Taking? Authorizing Provider  acetaminophen (TYLENOL) 325 MG tablet Take 650 mg by mouth every 6 (six) hours as needed for mild pain.    Yes [provider]  albuterol (PROVENTIL) (2.5 MG/3ML) 0.083% nebulizer solution Take 2.5 mg by nebulization every 6 (six) hours as needed for shortness of breath (when asthma is active).    Yes [provider]  albuterol (PROVENTIL,VENTOLIN) 90 MCG/ACT inhaler Inhale 2 puffs into the lungs every 6 (six) hours as needed for shortness of breath.    Yes [provider]  Alirocumab  (PRALUENT) 150 MG/ML SOPN Inject 1 pen into the skin every 14 (fourteen) days. 04/07/16  Yes Larey Dresser, MD  ALPRAZolam Duanne Moron) 0.5 MG tablet Take 0.5-1 mg by mouth 3 (three) times daily. 0.5mg  in the morning and at lunch, 1mg  at bedtime   Yes [provider]  amLODipine (NORVASC) 10 MG tablet Take 10 mg by mouth at bedtime.  01/20/15  Yes [provider]  aspirin EC 81 MG tablet Take 162 mg by mouth daily.   Yes [provider]  b complex vitamins capsule Take 1 capsule by mouth daily.   Yes [provider]  buPROPion (WELLBUTRIN XL) 150 MG 24 hr tablet Take 1 tablet by mouth daily. 04/09/16  Yes [provider]  Calcium Carbonate 1500 (600 CA) MG TABS Take 1,500 mg by mouth daily with breakfast.   Yes [provider]  Cholecalciferol (VITAMIN D) 2000 UNITS tablet Take 2,000 Units by mouth daily with breakfast.    Yes [provider]  cloNIDine (CATAPRES) 0.2 MG tablet Take 0.2 mg by mouth 2 (two) times daily.   Yes [provider]  clopidogrel (PLAVIX) 75 MG tablet TAKE ONE (1) TABLET BY MOUTH EVERY DAY 07/16/16  Yes Bensimhon, Shaune Pascal, MD  denosumab (PROLIA) 60 MG/ML SOLN injection Inject 60 mg into the skin every 6 (six) months. Administer in upper arm, thigh, or abdomen   Yes [provider]  DULoxetine (CYMBALTA) 30 MG capsule Take 30 mg by mouth daily with breakfast.    Yes [provider]  ezetimibe (ZETIA) 10 MG tablet TAKE ONE (1) TABLET EACH DAY 07/16/16  Yes Bensimhon, Shaune Pascal, MD  Fluticasone-Salmeterol (ADVAIR DISKUS) 250-50 MCG/DOSE AEPB Inhale 1 puff into the lungs 2 (two) times daily.    Yes [provider]  gabapentin (NEURONTIN) 600 MG tablet Take 600-1,200 mg by mouth 3 (three) times daily. 600mg  in the morning and at noon. 1200 mg at bedtime.   Yes [provider]  HUMALOG KWIKPEN 100 UNIT/ML SOPN Inject 14-22 Units into the skin 4 (four) times daily. 10 units at every  meal and 14 units at bedtime if needed 09/19/12  Yes [provider]  HYDROcodone-acetaminophen (NORCO/VICODIN) 5-325 MG per tablet Take 1 tablet by mouth at bedtime as needed for moderate pain.    Yes [provider]  ipratropium-albuterol (DUONEB) 0.5-2.5 (3) MG/3ML SOLN Take 3 mLs by nebulization as needed (If severe asthma , use every 4 hours for 10 days).    Yes [provider]  LANTUS SOLOSTAR 100 UNIT/ML Solostar Pen Inject 54 Units into the skin every morning. Can increase or decrease by 4 units depending on blood sugar levels 08/23/14  Yes [provider]  loperamide (IMODIUM) 2 MG capsule Take 2 mg by mouth as needed for diarrhea or loose stools.   Yes [provider]  losartan (COZAAR) 100 MG tablet Take 100 mg by mouth daily with breakfast.    Yes [provider]  magnesium oxide (MAG-OX) 400 MG tablet Take 400 mg by mouth 2 (two) times daily.   Yes [provider]  meclizine (ANTIVERT) 12.5 MG tablet Take 12.5 mg by mouth 3 (three) times daily as needed for dizziness.   Yes [provider]  methocarbamol (ROBAXIN) 500 MG tablet TAKE ONE (1) TABLET THREE (3) TIMES EACH DAY 05/24/16  Yes Kathrynn Ducking, MD  metoprolol succinate (TOPROL-XL) 25 MG 24 hr tablet TAKE ONE TABLET TWICE DAILY 09/12/15  Yes Larey Dresser, MD  Multiple Vitamin (MULTIVITAMIN) capsule Take 1 capsule by mouth daily.   Yes [provider]  multivitamin-lutein (OCUVITE-LUTEIN) CAPS capsule Take 1 capsule by mouth daily.   Yes [provider]  nitroGLYCERIN (NITROSTAT) 0.3 MG SL tablet Place 1 tablet (0.3 mg total) under the tongue every 5 (five) minutes x 3 doses as needed for chest pain. 08/10/16  Yes Shirley Friar, PA-C  ondansetron (ZOFRAN) 4 MG tablet Take 1 tablet (4 mg total) by mouth 2 (two) times daily as needed. For nausea Patient taking differently: Take 4 mg by mouth 2 (two) times daily. For nausea 02/12/14  Yes  Lafayette Dragon, MD  OXYGEN Inhale 2 mLs into the lungs.    Yes [provider]  pantoprazole (PROTONIX) 40 MG tablet Take 1 tablet (40 mg total) by mouth 2 (two) times daily. 04/26/16  Yes Irene Shipper, MD  RANEXA 1000 MG SR tablet TAKE ONE (1) TABLET BY MOUTH TWO (2) TIMES DAILY 06/21/16  Yes Bensimhon, Shaune Pascal, MD  rosuvastatin (CRESTOR) 40 MG tablet TAKE ONE (1) TABLET EACH DAY 12/09/15  Yes Larey Dresser, MD  torsemide (DEMADEX) 20 MG tablet TAKE 4 TABLETS BY MOUTH ONCE DAILY 08/19/16  Yes Larey Dresser, MD  triamterene-hydrochlorothiazide (DYAZIDE) 37.5-25 MG capsule Take 1 capsule by mouth daily.   Yes [provider]  vitamin B-12 (CYANOCOBALAMIN) 1000 MCG tablet Take 1,000 mcg by mouth daily.   Yes [provider]  zinc gluconate 50 MG tablet Take 50 mg by mouth at bedtime.    Yes [provider]  zolpidem (AMBIEN) 10 MG tablet Take 10 mg by mouth at bedtime.    Yes [provider]    ROS:  Out of a complete 14 system review of symptoms, the patient complains only of the following symptoms, and all other reviewed systems are negative.  Decreased activity, fatigue, weight gain, excessive sweating Cough, shortness of breath, chest tightness Chest pain, leg swelling, palpitations  of the heart, heart murmur Flushing nausea, vomiting Restless legs, insomnia, frequent waking Environmental allergies Urgency of the bladder Joint pain, joint swelling, back pain, achy muscles, muscle cramps, walking difficulty, neck stiffness Skin rash, itching Bruising easily Dizziness, headache, numbness, speech difficulty, weakness Depression, anxiety   Blood pressure (!) 131/47, pulse 61, height 5' 1.5" (1.562 m), weight 300 lb (136.1 kg).  Physical Exam  General: The patient is alert and cooperative at the time of the examination.The patient is morbidly obese.  Skin: 1+ edema to ankles is noted bilaterally.   Neurologic Exam  Mental status:  The patient is alert and oriented x 3 at the time of the examination. The patient has apparent normal recent and remote memory, with an apparently normal attention span and concentration ability.   Cranial nerves: Facial symmetry is present. Speech is normal, no aphasia or dysarthria is noted. Extraocular movements are full. Visual fields are full.  Motor: The patient has good strength in all 4 extremities.The patient has a brace on the left wrist.  Sensory examination: Soft touch sensation is symmetric on the face, arms, and legs.  Coordination: The patient has good finger-nose-finger and heel-to-shin bilaterally.  Gait and station: The patient has a  wide-based, limping type gait. Tandem gait was not attempted. Romberg is negative. No drift is seen.  Reflexes: Deep tendon reflexes are symmetric, but are decreased.   IMPRESSION: This MR angiogram of the intracranial arteries shows the following: 1. Moderately severe stenosis of the supraclinoid segment of the right internal carotid artery, progressed since the 2016 MR angiogram and moderate stenosis of the cavernous, progressed compared to the 2016 MRA, and milder stenosis of the supraclinoid segments of the left internal carotid artery.  2. Moderate stenosis of the A1 segment of the left anterior cerebral artery and stenosis within some of the M2 branches of the right middle cerebral artery 3. Severe stenosis of the basilar artery, progressed when compared to the 2016 MR angiogram and mild distal left vertebral artery stenosis. The vertebral arteries appear to have significantly less stenosis in the current study than on the 2016 MR angiogram. 4. Severe stenosis of the P1 segment of the left posterior cerebral artery, similar to the previous MR angiogram, with milder stenosis of the P2 segments bilaterally.  * MRI scan images were reviewed online. I agree with the written report.   Assessment/Plan:  1. Cerebrovascular  disease   2. Chronic low back pain   3. Morbid obesity   The patient has extensive atherosclerotic changes intracranially with significant basilar artery stenosis. The patient is being evaluated for an increase in heart rate, she may potentially have atrial fibrillation. The patient continues to have chronic low back pain. We will increase Cymbalta taking 30 mg twice daily. A prescription was also given for Robaxin, she seems to get some benefit from this. She will follow-up in 5 or 6 months.   Jill Alexanders MD 08/24/2016 12:59 PM  [facility] [address] [place], [place] [address]  Phone [phone] Fax [phone]

## 2016-08-25 ENCOUNTER — Encounter (HOSPITAL_COMMUNITY): Admission: RE | Admit: 2016-08-25 | Payer: Medicare Other | Source: Ambulatory Visit

## 2016-08-25 ENCOUNTER — Encounter: Payer: Self-pay | Admitting: Pulmonary Disease

## 2016-08-26 ENCOUNTER — Telehealth (HOSPITAL_COMMUNITY): Payer: Self-pay | Admitting: *Deleted

## 2016-08-26 NOTE — Telephone Encounter (Signed)
[name] [name] called to give an update on how she is feeling.  Pt has an appointment next week to have holter monitor placed. Pt has been taking it easy and resting.  Pt went to see Dr. Jannifer Franklin this week.  Asked pt had she checked her pulse.  Pt indicated that she had and noticed that when she is sitting quietly her HR is in the 70's to 80's.  When she is up moving around she is unable to "catch" her HR because it is beating so fast.  Pt did not feel up to coming to exercise on Friday and would like to try for Monday. Cherre Huger, BSN Cardiac and Training and development officer

## 2016-08-27 ENCOUNTER — Encounter (HOSPITAL_COMMUNITY): Admission: RE | Admit: 2016-08-27 | Payer: Medicare Other | Source: Ambulatory Visit

## 2016-08-30 ENCOUNTER — Encounter (HOSPITAL_COMMUNITY)
Admission: RE | Admit: 2016-08-30 | Discharge: 2016-08-30 | Disposition: A | Discharge status: Home / Self Care | Payer: Medicare Other | Source: Ambulatory Visit | Attending: Cardiology | Admitting: Cardiology

## 2016-08-30 DIAGNOSIS — I208 Other forms of angina pectoris: Secondary | ICD-10-CM

## 2016-08-30 DIAGNOSIS — R0902 Hypoxemia: Secondary | ICD-10-CM | POA: Diagnosis not present

## 2016-09-01 ENCOUNTER — Ambulatory Visit (INDEPENDENT_AMBULATORY_CARE_PROVIDER_SITE_OTHER): Payer: Medicare Other

## 2016-09-01 ENCOUNTER — Encounter (HOSPITAL_COMMUNITY)
Admission: RE | Admit: 2016-09-01 | Discharge: 2016-09-01 | Disposition: A | Discharge status: Home / Self Care | Payer: Medicare Other | Source: Ambulatory Visit | Attending: Cardiology | Admitting: Cardiology

## 2016-09-01 ENCOUNTER — Other Ambulatory Visit (HOSPITAL_COMMUNITY): Payer: Self-pay | Admitting: Cardiology

## 2016-09-01 DIAGNOSIS — I5022 Chronic systolic (congestive) heart failure: Secondary | ICD-10-CM

## 2016-09-01 DIAGNOSIS — G4734 Idiopathic sleep related nonobstructive alveolar hypoventilation: Secondary | ICD-10-CM | POA: Insufficient documentation

## 2016-09-01 DIAGNOSIS — I208 Other forms of angina pectoris: Secondary | ICD-10-CM

## 2016-09-01 DIAGNOSIS — R002 Palpitations: Secondary | ICD-10-CM | POA: Diagnosis not present

## 2016-09-01 DIAGNOSIS — R0902 Hypoxemia: Secondary | ICD-10-CM | POA: Diagnosis present

## 2016-09-01 LAB — GLUCOSE, CAPILLARY: Glucose-Capillary: 137 mg/dL — ABNORMAL HIGH (ref 65–99)

## 2016-09-02 ENCOUNTER — Encounter: Payer: Self-pay | Admitting: Adult Health

## 2016-09-02 ENCOUNTER — Ambulatory Visit (INDEPENDENT_AMBULATORY_CARE_PROVIDER_SITE_OTHER): Payer: Medicare Other | Admitting: Adult Health

## 2016-09-02 DIAGNOSIS — J4530 Mild persistent asthma, uncomplicated: Secondary | ICD-10-CM | POA: Diagnosis not present

## 2016-09-02 DIAGNOSIS — G4734 Idiopathic sleep related nonobstructive alveolar hypoventilation: Secondary | ICD-10-CM | POA: Diagnosis not present

## 2016-09-02 DIAGNOSIS — I208 Other forms of angina pectoris: Secondary | ICD-10-CM

## 2016-09-02 NOTE — Assessment & Plan Note (Signed)
Controlled on Advair .

## 2016-09-02 NOTE — Progress Notes (Signed)
@Patient  ID: [name] [name], female    DOB: 12-30-1945, 71 y.o.   [name]: [id number]  Chief Complaint  Patient presents with  . Follow-up    Asthma    Referring provider: Derinda Late, MD  HPI: 71 year old never smoker referred for evaluation of nocturnal hypoxia. She has ischemic cardiomyopathy and chronic diastolic heart failure  Sleep study done in 2006 which suggested moderate OSA. She is very claustrophobic and could not tolerate CPAP mask,   She underwent another in [place] 05/18/16 which surprisingly did not show OSA.  She slept supine and REM sleep was minimal. She did have severe desaturations to 81% and was placed on 2 L of oxygen for the remainder of the study.  Asthma dx in 20s, controlled on Advair  She had a CVA in 03/2012 with visual loss and had a left carotid stent placed in 2015 (deveshwar)    Significant tests/ events NPSG 2006:  AHI 19/hr.  PSG 05/28/16 no OSA   Echo 06/2015- EF 55-60%, RVSP 61 mm Right heart cath 05/2015 PCWP 9  09/02/2016 Follow up : Asthma , Nocturnal Hypoxia on O2 At bedtime   Patient presents for a three-month follow-up. Patient has known asthma. She is maintained on Advair twice daily. Patient says she denies any increased cough or wheezing. Rare use of her albuterol. Patient is on nocturnal oxygen at 2 L. Previous sleep study in April 2018 did not show any obstructive sleep apnea but showed nocturnal hypoxemia. Joselyn Arrow done last week showed, results are pending .   Patient has known congestive heart failure is followed at the CHF clinic. Patient says she has lower extremity swelling. She takes Demadex .     Allergies  Allergen Reactions  . Amoxicillin Shortness Of Breath and Rash  . Brilinta [Ticagrelor] Shortness Of Breath  . Erythromycin Shortness Of Breath, Other (See Comments) and Hives    Trouble swallowing  . Flagyl [Metronidazole] Palpitations and Shortness Of Breath  . Penicillins Shortness Of Breath, Rash and Hives  .  Isosorbide Mononitrate [Isosorbide Nitrate] Other (See Comments)    Joint aches, muscles hurt, difficult to walk   . Jardiance [Empagliflozin] Other (See Comments)    Nausea, joint aches, muscles aches  . Metformin And Related Other (See Comments)    Stomach pain, cold sweats, joint pain, burred vision, dizziness  . Tape Other (See Comments)    Skin pulls off with certain types Plastic tape causes skin to rip if left on for long periods of time  . Erythromycin Base Rash    Immunization History  Administered Date(s) Administered  . DTaP 03/27/2014  . Influenza Split 12/07/2011  . Influenza Whole 01/02/2000  . Influenza-Unspecified 12/02/2012, 12/07/2014, 12/02/2015  . Pneumococcal Conjugate-13 02/07/2012, 01/01/2013  . Tdap 12/30/2010, 03/27/2014  . Zoster 07/23/2010    Past Medical History:  Diagnosis Date  . Abnormality of gait 05/14/2014  . Anemia    hx  . Anginal pain ([place])   . Anxiety   . Asthma   . Bundle branch block, left    chronic/notes 07/18/2013  . Cancer [facility]) April 2016   BCC,  Melanoma  . CHF (congestive heart failure) ([place])   . Chronic bronchitis ([place])    "off and on all the time" (07/18/2013)  . Chronic insomnia 05/06/2015  . Chronic kidney disease    frequency, sses dr Jamal Maes every 4 to 6 months  . Chronic low back pain 08/24/2016  . Chronic lower back pain   . Claustrophobia   .  Common migraine 05/14/2014  . Coronary artery disease   . Dysrhythmia   . GERD (gastroesophageal reflux disease)   . H/O hiatal hernia   . Headache    "probably 4 days/wk" (07/18/2013)  . Heart murmur   . Hyperlipidemia   . Hypertension   . Irregular heart beat   . Leg cramps    both legs at times  . Memory change 05/14/2014  . Mental disorder    hx of confusion  . Migraine    "used to have them really bad; kind of eased up now; down to 1 q couple months" (07/18/2013)  . Myocardial infarction ([place])    04/1999, 02/2000, 01/2005; 2011; 2014  . Neuromuscular disorder  ([place])    ?  . Obesity 01-2010  . Obstructive sleep apnea    "can't wear machine; I have claustrophobia" (07/18/2013)  . Osteoarthritis    "knees and hands" (07/18/2013)  . Other and unspecified angina pectoris   . Peripheral vascular disease ([facility])    ? numbness, tingling arms and legs  . Pneumonia 2000's   "once"  . PONV (postoperative nausea and vomiting)   . Shortness of breath    with exertion  . Stroke ([place]) 03/22/12   right side brain; denies residual on 07/18/2013)  . Stroke [facility]) Oct. 2015   Affected pt.s balance  . Stroke ([place]) 10/2014  . Type II diabetes mellitus ([place])   . Ventral hernia    hx of  . Vomiting    persistent  . Vomiting blood     Tobacco History: History  Smoking Status  . Never Smoker  Smokeless Tobacco  . Never Used   Counseling given: Not Answered   Outpatient Encounter Prescriptions as of 09/02/2016  Medication Sig  . acetaminophen (TYLENOL) 325 MG tablet Take 650 mg by mouth every 6 (six) hours as needed for mild pain.   Marland Kitchen albuterol (PROVENTIL) (2.5 MG/3ML) 0.083% nebulizer solution Take 2.5 mg by nebulization every 6 (six) hours as needed for shortness of breath (when asthma is active).   Marland Kitchen albuterol (PROVENTIL,VENTOLIN) 90 MCG/ACT inhaler Inhale 2 puffs into the lungs every 6 (six) hours as needed for shortness of breath.   . Alirocumab (PRALUENT) 150 MG/ML SOPN Inject 1 pen into the skin every 14 (fourteen) days.  . ALPRAZolam (XANAX) 0.5 MG tablet Take 0.5-1 mg by mouth 3 (three) times daily. 0.5mg  in the morning and at lunch, 1mg  at bedtime  . amLODipine (NORVASC) 10 MG tablet Take 10 mg by mouth at bedtime.   Marland Kitchen aspirin EC 81 MG tablet Take 162 mg by mouth daily.  Marland Kitchen b complex vitamins capsule Take 1 capsule by mouth daily.  Marland Kitchen buPROPion (WELLBUTRIN XL) 150 MG 24 hr tablet Take 1 tablet by mouth daily.  . Calcium Carbonate 1500 (600 CA) MG TABS Take 1,500 mg by mouth daily with breakfast.  . Cholecalciferol (VITAMIN D) 2000 UNITS tablet Take  2,000 Units by mouth daily with breakfast.   . cloNIDine (CATAPRES) 0.2 MG tablet Take 0.2 mg by mouth 2 (two) times daily.  . clopidogrel (PLAVIX) 75 MG tablet TAKE ONE (1) TABLET BY MOUTH EVERY DAY  . denosumab (PROLIA) 60 MG/ML SOLN injection Inject 60 mg into the skin every 6 (six) months. Administer in upper arm, thigh, or abdomen  . DULoxetine (CYMBALTA) 30 MG capsule Take 1 capsule (30 mg total) by mouth 2 (two) times daily.  Marland Kitchen ezetimibe (ZETIA) 10 MG tablet TAKE ONE (1) TABLET EACH DAY  .  Fluticasone-Salmeterol (ADVAIR DISKUS) 250-50 MCG/DOSE AEPB Inhale 1 puff into the lungs 2 (two) times daily.   Marland Kitchen gabapentin (NEURONTIN) 600 MG tablet Take 600-1,200 mg by mouth 3 (three) times daily. 600mg  in the morning and at noon. 1200 mg at bedtime.  Marland Kitchen HUMALOG KWIKPEN 100 UNIT/ML SOPN Inject 14-22 Units into the skin 4 (four) times daily. 10 units at every meal and 14 units at bedtime if needed  . HYDROcodone-acetaminophen (NORCO/VICODIN) 5-325 MG per tablet Take 1 tablet by mouth at bedtime as needed for moderate pain.   Marland Kitchen ipratropium-albuterol (DUONEB) 0.5-2.5 (3) MG/3ML SOLN Take 3 mLs by nebulization as needed (If severe asthma , use every 4 hours for 10 days).   Marland Kitchen LANTUS SOLOSTAR 100 UNIT/ML Solostar Pen Inject 54 Units into the skin every morning. Can increase or decrease by 4 units depending on blood sugar levels  . loperamide (IMODIUM) 2 MG capsule Take 2 mg by mouth as needed for diarrhea or loose stools.  Marland Kitchen losartan (COZAAR) 100 MG tablet Take 100 mg by mouth daily with breakfast.   . magnesium oxide (MAG-OX) 400 MG tablet Take 400 mg by mouth 2 (two) times daily.  . meclizine (ANTIVERT) 12.5 MG tablet Take 12.5 mg by mouth 3 (three) times daily as needed for dizziness.  . methocarbamol (ROBAXIN) 500 MG tablet TAKE ONE (1) TABLET THREE (3) TIMES EACH DAY  . metoprolol succinate (TOPROL-XL) 25 MG 24 hr tablet TAKE ONE TABLET TWICE DAILY  . Multiple Vitamin (MULTIVITAMIN) capsule Take 1  capsule by mouth daily.  . multivitamin-lutein (OCUVITE-LUTEIN) CAPS capsule Take 1 capsule by mouth daily.  . nitroGLYCERIN (NITROSTAT) 0.3 MG SL tablet Place 1 tablet (0.3 mg total) under the tongue every 5 (five) minutes x 3 doses as needed for chest pain.  Marland Kitchen ondansetron (ZOFRAN) 4 MG tablet Take 1 tablet (4 mg total) by mouth 2 (two) times daily as needed. For nausea (Patient taking differently: Take 4 mg by mouth 2 (two) times daily. For nausea)  . OXYGEN Inhale 2 mLs into the lungs.   . pantoprazole (PROTONIX) 40 MG tablet Take 1 tablet (40 mg total) by mouth 2 (two) times daily.  Marland Kitchen RANEXA 1000 MG SR tablet TAKE ONE (1) TABLET BY MOUTH TWO (2) TIMES DAILY  . rosuvastatin (CRESTOR) 40 MG tablet TAKE ONE (1) TABLET EACH DAY  . torsemide (DEMADEX) 20 MG tablet TAKE 4 TABLETS BY MOUTH ONCE DAILY  . triamterene-hydrochlorothiazide (DYAZIDE) 37.5-25 MG capsule Take 1 capsule by mouth daily.  . vitamin B-12 (CYANOCOBALAMIN) 1000 MCG tablet Take 1,000 mcg by mouth daily.  Marland Kitchen zinc gluconate 50 MG tablet Take 50 mg by mouth at bedtime.   Marland Kitchen zolpidem (AMBIEN) 10 MG tablet Take 10 mg by mouth at bedtime.    No facility-administered encounter medications on file as of 09/02/2016.      Review of Systems  Constitutional:   No  weight loss, night sweats,  Fevers, chills, fatigue, or  lassitude.  HEENT:   No headaches,  Difficulty swallowing,  Tooth/dental problems, or  Sore throat,                No sneezing, itching, ear ache, nasal congestion, post nasal drip,   CV:  No chest pain,  Orthopnea, PND, swelling in lower extremities, anasarca, dizziness, palpitations, syncope.   GI  No heartburn, indigestion, abdominal pain, nausea, vomiting, diarrhea, change in bowel habits, loss of appetite, bloody stools.   Resp: No shortness of breath with exertion or at  rest.  No excess mucus, no productive cough,  No non-productive cough,  No coughing up of blood.  No change in color of mucus.  No wheezing.  No  chest wall deformity  Skin: no rash or lesions.  GU: no dysuria, change in color of urine, no urgency or frequency.  No flank pain, no hematuria   MS:  No joint pain or swelling.  No decreased range of motion.  No back pain.    Physical Exam  BP (!) 144/84 (BP Location: Left Arm, Cuff Size: Large)   Pulse 70   Ht 5\' 2"  (1.575 m)   Wt (!) 300 lb 6.4 oz (136.3 kg)   SpO2 94%   BMI 54.94 kg/m   GEN: A/Ox3; pleasant , NAD, well nourished    HEENT:  [place]/AT,  EACs-clear, TMs-wnl, NOSE-clear, THROAT-clear, no lesions, no postnasal drip or exudate noted.   NECK:  Supple w/ fair ROM; no JVD; normal carotid impulses w/o bruits; no thyromegaly or nodules palpated; no lymphadenopathy.    RESP  Clear  P & A; w/o, wheezes/ rales/ or rhonchi. no accessory muscle use, no dullness to percussion  CARD:  RRR, no m/r/g, tr -1  peripheral edema, pulses intact, no cyanosis or clubbing.  GI:   Soft & nt; nml bowel sounds; no organomegaly or masses detected.   Musco: Warm bil, no deformities or joint swelling noted.   Neuro: alert, no focal deficits noted.    Skin: Warm, no lesions or rashes    Lab Results:  CBC   ProBNP Imaging: Mr Wrist Left W/o Contrast  Result Date: 08/10/2016 CLINICAL DATA:  Left wrist pain and swelling. Unable to extend the thumb. Suspected EPL rupture. EXAM: MR OF THE LEFT WRIST WITHOUT CONTRAST TECHNIQUE: Multiplanar, multisequence MR imaging of the left wrist was performed. No intravenous contrast was administered. COMPARISON:  Radiograph 08/02/2016 FINDINGS: Examination is quite limited due to patient motion. Ligaments: The intercarpal ligaments appear intact. The intercarpal joint spaces are maintained. Triangular fibrocartilage: Moderate negative ulnar variance. No obvious TFCC tear but there is moderate fluid in the radioulnar joint. Tendons: The extensor pollicis longus tendon is ruptured. The tendon is visualized distally but as it crosses over the APL and EPB  tendons is no longer visualized. This is best seen on coronal series 8 image numbers 11 and 12. It is not seen on the ulnar aspect of Lister's tubercle and must be retracted further back into the proximal forearm. Carpal tunnel/median nerve: Normal Guyon's canal: Normal Joint/cartilage: Moderate degenerative changes and small joint effusion. Scattered carpal cysts versus small erosions. Bones/carpal alignment: No acute bony findings.  No fracture or AVN. Other: The wrist and hand musculature is grossly normal. IMPRESSION: 1. Ruptured extensor pollicis longus tendon as described above. The APL and EPB tendons are intact. 2. Negative ulnar variance and degenerative changes but no acute bony abnormality. 3. Grossly intact intercarpal ligaments and TFCC. These results will be called to the ordering clinician or representative by the Radiologist Assistant, and communication documented in the PACS or zVision Dashboard. Electronically Signed   By: Marijo Sanes M.D.   On: 08/10/2016 07:20     Assessment & Plan:   Asthma Controlled on Advair .   Nocturnal hypoxia Doing well on O2 At bedtime   Check for ONO results on O2.      Rexene Edison, NP 09/02/2016

## 2016-09-02 NOTE — Patient Instructions (Addendum)
Continue using 2 L oxygen during sleep. Continue on Advair 1 puff Twice daily  , rinse after use.  Follow up with Dr. Elsworth Soho  In 4 months and As needed

## 2016-09-02 NOTE — Assessment & Plan Note (Signed)
Doing well on O2 At bedtime   Check for ONO results on O2.

## 2016-09-03 ENCOUNTER — Encounter (HOSPITAL_COMMUNITY)
Admission: RE | Admit: 2016-09-03 | Discharge: 2016-09-03 | Disposition: A | Discharge status: Home / Self Care | Payer: Medicare Other | Source: Ambulatory Visit | Attending: Cardiology | Admitting: Cardiology

## 2016-09-03 ENCOUNTER — Telehealth: Payer: Self-pay | Admitting: Adult Health

## 2016-09-03 DIAGNOSIS — G4734 Idiopathic sleep related nonobstructive alveolar hypoventilation: Secondary | ICD-10-CM

## 2016-09-03 DIAGNOSIS — I208 Other forms of angina pectoris: Secondary | ICD-10-CM

## 2016-09-03 DIAGNOSIS — R0902 Hypoxemia: Secondary | ICD-10-CM | POA: Diagnosis not present

## 2016-09-03 NOTE — Telephone Encounter (Signed)
7.25.18 ONO received and viewed by TP: +ONO on room air.  Continue 2lpm QHS.  Repeat ONO on 2lpm in 4 weeks.  Called spoke with patient, discussed ONO results/recs Pt will continue her O2 and is aware repeat will be done in 4 weeks Order placed ONO sent for scan Nothing further needed; will sign off

## 2016-09-06 ENCOUNTER — Encounter (HOSPITAL_COMMUNITY)
Admission: RE | Admit: 2016-09-06 | Discharge: 2016-09-06 | Disposition: A | Discharge status: Home / Self Care | Payer: Medicare Other | Source: Ambulatory Visit | Attending: Cardiology | Admitting: Cardiology

## 2016-09-06 DIAGNOSIS — I208 Other forms of angina pectoris: Secondary | ICD-10-CM

## 2016-09-06 DIAGNOSIS — R0902 Hypoxemia: Secondary | ICD-10-CM | POA: Diagnosis not present

## 2016-09-08 ENCOUNTER — Encounter (HOSPITAL_COMMUNITY)
Admission: RE | Admit: 2016-09-08 | Discharge: 2016-09-08 | Disposition: A | Discharge status: Home / Self Care | Payer: Medicare Other | Source: Ambulatory Visit | Attending: Cardiology | Admitting: Cardiology

## 2016-09-08 DIAGNOSIS — R0902 Hypoxemia: Secondary | ICD-10-CM | POA: Diagnosis not present

## 2016-09-08 DIAGNOSIS — I208 Other forms of angina pectoris: Secondary | ICD-10-CM

## 2016-09-08 NOTE — Progress Notes (Signed)
Caitlinn was very fatigued after completing 4 walks of her walk test using a wheelchair. Patient placed in chair. Hr 115. Telemetry rhythm Sinus tach. Blood pressure noted at 198/88. Diavian increased her distance from her entry walk test.  Oxygen saturation 96% on 2l/min of oxygen. Recheck blood pressure 148/72 then 132/70. Once Jhoselyn rested she was able to complete today's exercise session without difficulty. Darrick Grinder NP called and notified about today's elevated blood pressure. No new orders received.Will continue to monitor the patient throughout  the program.Kamrie Fanton Venetia Maxon, RN,BSN 09/09/2016 2:29 PM

## 2016-09-10 ENCOUNTER — Encounter (HOSPITAL_COMMUNITY)
Admission: RE | Admit: 2016-09-10 | Discharge: 2016-09-10 | Disposition: A | Discharge status: Home / Self Care | Payer: Medicare Other | Source: Ambulatory Visit | Attending: Cardiology | Admitting: Cardiology

## 2016-09-10 DIAGNOSIS — R0902 Hypoxemia: Secondary | ICD-10-CM | POA: Diagnosis not present

## 2016-09-10 DIAGNOSIS — I208 Other forms of angina pectoris: Secondary | ICD-10-CM

## 2016-09-13 ENCOUNTER — Encounter (HOSPITAL_COMMUNITY)
Admission: RE | Admit: 2016-09-13 | Discharge: 2016-09-13 | Disposition: A | Discharge status: Home / Self Care | Payer: Medicare Other | Source: Ambulatory Visit | Attending: Cardiology | Admitting: Cardiology

## 2016-09-13 DIAGNOSIS — I208 Other forms of angina pectoris: Secondary | ICD-10-CM

## 2016-09-13 DIAGNOSIS — R0902 Hypoxemia: Secondary | ICD-10-CM | POA: Diagnosis not present

## 2016-09-15 ENCOUNTER — Encounter (HOSPITAL_COMMUNITY)
Admission: RE | Admit: 2016-09-15 | Discharge: 2016-09-15 | Disposition: A | Discharge status: Home / Self Care | Payer: Medicare Other | Source: Ambulatory Visit | Attending: Cardiology | Admitting: Cardiology

## 2016-09-15 DIAGNOSIS — R0902 Hypoxemia: Secondary | ICD-10-CM | POA: Diagnosis not present

## 2016-09-15 DIAGNOSIS — I208 Other forms of angina pectoris: Secondary | ICD-10-CM

## 2016-09-15 NOTE — Progress Notes (Signed)
Cardiac Individual Treatment Plan  Patient Details  Name: MELAINIE KRINSKY [name]: [id number] Date of Birth: April 02, 1945 Referring Provider:     Okay from 06/17/2016 in [place]  Referring Provider  Loralie Champagne MD      Initial Encounter Date:    CARDIAC REHAB PHASE II ORIENTATION from 06/17/2016 in [place]  Date  06/17/16  Referring Provider  Loralie Champagne MD      Visit Diagnosis: Stable angina [facility])  Patient's Home Medications on Admission:  Current Outpatient Prescriptions:  .  acetaminophen (TYLENOL) 325 MG tablet, Take 650 mg by mouth every 6 (six) hours as needed for mild pain. , Disp: , Rfl:  .  albuterol (PROVENTIL) (2.5 MG/3ML) 0.083% nebulizer solution, Take 2.5 mg by nebulization every 6 (six) hours as needed for shortness of breath (when asthma is active). , Disp: , Rfl:  .  albuterol (PROVENTIL,VENTOLIN) 90 MCG/ACT inhaler, Inhale 2 puffs into the lungs every 6 (six) hours as needed for shortness of breath. , Disp: , Rfl:  .  Alirocumab (PRALUENT) 150 MG/ML SOPN, Inject 1 pen into the skin every 14 (fourteen) days., Disp: 6 pen, Rfl: 3 .  ALPRAZolam (XANAX) 0.5 MG tablet, Take 0.5-1 mg by mouth 3 (three) times daily. 0.5mg  in the morning and at lunch, 1mg  at bedtime, Disp: , Rfl:  .  amLODipine (NORVASC) 10 MG tablet, Take 10 mg by mouth at bedtime. , Disp: , Rfl:  .  aspirin EC 81 MG tablet, Take 162 mg by mouth daily., Disp: , Rfl:  .  b complex vitamins capsule, Take 1 capsule by mouth daily., Disp: , Rfl:  .  buPROPion (WELLBUTRIN XL) 150 MG 24 hr tablet, Take 1 tablet by mouth daily., Disp: , Rfl:  .  Calcium Carbonate 1500 (600 CA) MG TABS, Take 1,500 mg by mouth daily with breakfast., Disp: , Rfl:  .  Cholecalciferol (VITAMIN D) 2000 UNITS tablet, Take 2,000 Units by mouth daily with breakfast. , Disp: , Rfl:  .  cloNIDine (CATAPRES) 0.2 MG tablet, Take 0.2 mg by mouth 2  (two) times daily., Disp: , Rfl:  .  clopidogrel (PLAVIX) 75 MG tablet, TAKE ONE (1) TABLET BY MOUTH EVERY DAY, Disp: 90 tablet, Rfl: 3 .  denosumab (PROLIA) 60 MG/ML SOLN injection, Inject 60 mg into the skin every 6 (six) months. Administer in upper arm, thigh, or abdomen, Disp: , Rfl:  .  DULoxetine (CYMBALTA) 30 MG capsule, Take 1 capsule (30 mg total) by mouth 2 (two) times daily., Disp: 180 capsule, Rfl: 2 .  ezetimibe (ZETIA) 10 MG tablet, TAKE ONE (1) TABLET EACH DAY, Disp: 90 tablet, Rfl: 3 .  Fluticasone-Salmeterol (ADVAIR DISKUS) 250-50 MCG/DOSE AEPB, Inhale 1 puff into the lungs 2 (two) times daily. , Disp: , Rfl:  .  gabapentin (NEURONTIN) 600 MG tablet, Take 600-1,200 mg by mouth 3 (three) times daily. 600mg  in the morning and at noon. 1200 mg at bedtime., Disp: , Rfl:  .  HUMALOG KWIKPEN 100 UNIT/ML SOPN, Inject 14-22 Units into the skin 4 (four) times daily. 10 units at every meal and 14 units at bedtime if needed, Disp: , Rfl:  .  HYDROcodone-acetaminophen (NORCO/VICODIN) 5-325 MG per tablet, Take 1 tablet by mouth at bedtime as needed for moderate pain. , Disp: , Rfl:  .  ipratropium-albuterol (DUONEB) 0.5-2.5 (3) MG/3ML SOLN, Take 3 mLs by nebulization as needed (If severe asthma , use every  4 hours for 10 days). , Disp: , Rfl:  .  LANTUS SOLOSTAR 100 UNIT/ML Solostar Pen, Inject 54 Units into the skin every morning. Can increase or decrease by 4 units depending on blood sugar levels, Disp: , Rfl:  .  loperamide (IMODIUM) 2 MG capsule, Take 2 mg by mouth as needed for diarrhea or loose stools., Disp: , Rfl:  .  losartan (COZAAR) 100 MG tablet, Take 100 mg by mouth daily with breakfast. , Disp: , Rfl:  .  magnesium oxide (MAG-OX) 400 MG tablet, Take 400 mg by mouth 2 (two) times daily., Disp: , Rfl:  .  meclizine (ANTIVERT) 12.5 MG tablet, Take 12.5 mg by mouth 3 (three) times daily as needed for dizziness., Disp: , Rfl:  .  methocarbamol (ROBAXIN) 500 MG tablet, TAKE ONE (1) TABLET  THREE (3) TIMES EACH DAY, Disp: 60 tablet, Rfl: 4 .  metoprolol succinate (TOPROL-XL) 25 MG 24 hr tablet, TAKE ONE TABLET TWICE DAILY, Disp: 180 tablet, Rfl: 3 .  Multiple Vitamin (MULTIVITAMIN) capsule, Take 1 capsule by mouth daily., Disp: , Rfl:  .  multivitamin-lutein (OCUVITE-LUTEIN) CAPS capsule, Take 1 capsule by mouth daily., Disp: , Rfl:  .  nitroGLYCERIN (NITROSTAT) 0.3 MG SL tablet, Place 1 tablet (0.3 mg total) under the tongue every 5 (five) minutes x 3 doses as needed for chest pain., Disp: 90 tablet, Rfl: 1 .  ondansetron (ZOFRAN) 4 MG tablet, Take 1 tablet (4 mg total) by mouth 2 (two) times daily as needed. For nausea (Patient taking differently: Take 4 mg by mouth 2 (two) times daily. For nausea), Disp: 20 tablet, Rfl: 0 .  OXYGEN, Inhale 2 mLs into the lungs. , Disp: , Rfl:  .  pantoprazole (PROTONIX) 40 MG tablet, Take 1 tablet (40 mg total) by mouth 2 (two) times daily., Disp: 180 tablet, Rfl: 0 .  RANEXA 1000 MG SR tablet, TAKE ONE (1) TABLET BY MOUTH TWO (2) TIMES DAILY, Disp: 60 each, Rfl: 6 .  rosuvastatin (CRESTOR) 40 MG tablet, TAKE ONE (1) TABLET EACH DAY, Disp: 90 tablet, Rfl: 6 .  torsemide (DEMADEX) 20 MG tablet, TAKE 4 TABLETS BY MOUTH ONCE DAILY, Disp: 240 tablet, Rfl: 3 .  triamterene-hydrochlorothiazide (DYAZIDE) 37.5-25 MG capsule, Take 1 capsule by mouth daily., Disp: , Rfl:  .  vitamin B-12 (CYANOCOBALAMIN) 1000 MCG tablet, Take 1,000 mcg by mouth daily., Disp: , Rfl:  .  zinc gluconate 50 MG tablet, Take 50 mg by mouth at bedtime. , Disp: , Rfl:  .  zolpidem (AMBIEN) 10 MG tablet, Take 10 mg by mouth at bedtime. , Disp: , Rfl:   Past Medical History: Past Medical History:  Diagnosis Date  . Abnormality of gait 05/14/2014  . Anemia    hx  . Anginal pain ([place])   . Anxiety   . Asthma   . Bundle branch block, left    chronic/notes 07/18/2013  . Cancer [facility]) April 2016   BCC,  Melanoma  . CHF (congestive heart failure) ([place])   . Chronic bronchitis ([place])     "off and on all the time" (07/18/2013)  . Chronic insomnia 05/06/2015  . Chronic kidney disease    frequency, sses dr Jamal Maes every 4 to 6 months  . Chronic low back pain 08/24/2016  . Chronic lower back pain   . Claustrophobia   . Common migraine 05/14/2014  . Coronary artery disease   . Dysrhythmia   . GERD (gastroesophageal reflux disease)   . H/O hiatal  hernia   . Headache    "probably 4 days/wk" (07/18/2013)  . Heart murmur   . Hyperlipidemia   . Hypertension   . Irregular heart beat   . Leg cramps    both legs at times  . Memory change 05/14/2014  . Mental disorder    hx of confusion  . Migraine    "used to have them really bad; kind of eased up now; down to 1 q couple months" (07/18/2013)  . Myocardial infarction ([place])    04/1999, 02/2000, 01/2005; 2011; 2014  . Neuromuscular disorder ([place])    ?  . Obesity 01-2010  . Obstructive sleep apnea    "can't wear machine; I have claustrophobia" (07/18/2013)  . Osteoarthritis    "knees and hands" (07/18/2013)  . Other and unspecified angina pectoris   . Peripheral vascular disease (HCC)    ? numbness, tingling arms and legs  . Pneumonia 2000's   "once"  . PONV (postoperative nausea and vomiting)   . Shortness of breath    with exertion  . Stroke ([place]) 03/22/12   right side brain; denies residual on 07/18/2013)  . Stroke [facility]) Oct. 2015   Affected pt.s balance  . Stroke ([place]) 10/2014  . Type II diabetes mellitus ([place])   . Ventral hernia    hx of  . Vomiting    persistent  . Vomiting blood     Tobacco Use: History  Smoking Status  . Never Smoker  Smokeless Tobacco  . Never Used    Labs: Recent Review Flowsheet Data    Labs for ITP Cardiac and Pulmonary Rehab Latest Ref Rng & Units 05/15/2014 11/13/2014 05/29/2015 06/24/2015 03/12/2016   Cholestrol 0 - 200 mg/dL 141 164 - 183 189   LDLCALC 0 - 99 mg/dL 50 75 - 96 97   LDLDIRECT mg/dL - - - - -   HDL >40 mg/dL 55.60 56 - 56 56   Trlycerides <150 mg/dL 179.0(H)  163(H) - 156(H) 181(H)   Hemoglobin A1c <5.7 % - - - - -   PHART 7.350 - 7.400 - - - - -   PCO2ART 35.0 - 45.0 mmHg - - - - -   HCO3 20.0 - 24.0 mEq/L - - 30.3(H) - -   TCO2 0 - 100 mmol/L - - 32 - -   ACIDBASEDEF 0.0 - 2.0 mmol/L - - - - -   O2SAT % - - 70.0 - -      Capillary Blood Glucose: Lab Results  Component Value Date   GLUCAP 137 (H) 09/01/2016   GLUCAP 181 (H) 08/18/2016   GLUCAP 98 07/09/2016   GLUCAP 256 (H) 07/09/2016   GLUCAP 200 (H) 07/02/2016     Exercise Target Goals:    Exercise Program Goal: Individual exercise prescription set with THRR, safety & activity barriers. Participant demonstrates ability to understand and report RPE using BORG scale, to self-measure pulse accurately, and to acknowledge the importance of the exercise prescription.  Exercise Prescription Goal: Starting with aerobic activity 30 plus minutes a day, 3 days per week for initial exercise prescription. Provide home exercise prescription and guidelines that participant acknowledges understanding prior to discharge.  Activity Barriers & Risk Stratification:     Activity Barriers & Cardiac Risk Stratification - 06/17/16 0837      Activity Barriers & Cardiac Risk Stratification   Cardiac Risk Stratification High      6 Minute Walk:     6 Minute Walk    Row Name  06/17/16 1117         6 Minute Walk   Phase Initial     Distance 463 feet     Walk Time 6 minutes     # of Rest Breaks 0     MPH 0.9     METS 0.6     RPE 17     VO2 Peak 2.1     Symptoms No     Resting HR 76 bpm     Resting BP 140/70     Max Ex. HR 96 bpm     Max Ex. BP 208/96     2 Minute Post BP 165/65        Oxygen Initial Assessment:   Oxygen Re-Evaluation:   Oxygen Discharge (Final Oxygen Re-Evaluation):   Initial Exercise Prescription:     Initial Exercise Prescription - 06/17/16 1100      Date of Initial Exercise RX and Referring Provider   Date 06/17/16   Referring Provider Loralie Champagne MD     NuStep   Level 1   SPM 80   Minutes 20   METs 1.8     Arm Ergometer   Level 1   Watts 25   Minutes 10   METs 1.9     Prescription Details   Frequency (times per week) 3   Duration Progress to 45 minutes of aerobic exercise without signs/symptoms of physical distress     Intensity   THRR 40-80% of Max Heartrate 61-122   Ratings of Perceived Exertion 11-13   Perceived Dyspnea 0-4     Progression   Progression Continue to progress workloads to maintain intensity without signs/symptoms of physical distress.     Resistance Training   Training Prescription Yes   Weight 2   Reps 10-15      Perform Capillary Blood Glucose checks as needed.  Exercise Prescription Changes:     Exercise Prescription Changes    Row Name 07/15/16 0800 08/16/16 1100 09/09/16 1700         Response to Exercise   Blood Pressure (Admit) (P)  118/74 121/84 142/88     Blood Pressure (Exercise) (P)  152/80 114/64 140/84     Blood Pressure (Exit) (P)  142/80 102/72 128/82     Heart Rate (Admit) (P)  68 bpm 69 bpm 77 bpm     Heart Rate (Exercise) (P)  83 bpm 86 bpm 108 bpm     Heart Rate (Exit) (P)  50 bpm 61 bpm 59 bpm     Rating of Perceived Exertion (Exercise) (P)  11 12 12      Duration (P)  Progress to 45 minutes of aerobic exercise without signs/symptoms of physical distress Progress to 45 minutes of aerobic exercise without signs/symptoms of physical distress Progress to 45 minutes of aerobic exercise without signs/symptoms of physical distress     Intensity (P)  THRR unchanged THRR unchanged THRR unchanged       Progression   Progression (P)  Continue to progress workloads to maintain intensity without signs/symptoms of physical distress. Continue to progress workloads to maintain intensity without signs/symptoms of physical distress. Continue to progress workloads to maintain intensity without signs/symptoms of physical distress.     Average METs  - 1.7 1.9       Resistance  Training   Training Prescription (P)  Yes Yes No     Weight (P)  2  -  -     Reps (P)  10-15 10-15  -  NuStep   Level (P)  1 1 3      SPM (P)  80 80 80     Minutes (P)  20 20 20      METs (P)  1.8 1.8 1.8       Arm Ergometer   Level (P)  1 1 1      Watts (P)  25 25 25      Minutes (P)  10 10 10      METs (P)  1.9 1.9 1.9       Home Exercise Plan   Plans to continue exercise at  - Home (comment) Home (comment)     Frequency  - Add 3 additional days to program exercise sessions. Add 3 additional days to program exercise sessions.        Exercise Comments:     Exercise Comments    Row Name 06/23/16 1117 07/19/16 1636 08/16/16 1118 09/09/16 1715     Exercise Comments Pt is off to a good start with exercise! Reviewed METs and goals with pt.  Reviewed goals with pt.   Reviewed goals with pt.         Exercise Goals and Review:     Exercise Goals    Row Name 06/17/16 0837             Exercise Goals   Increase Physical Activity Yes  increase functional mobility       Intervention Provide advice, education, support and counseling about physical activity/exercise needs.;Develop an individualized exercise prescription for aerobic and resistive training based on initial evaluation findings, risk stratification, comorbidities and participant's personal goals.       Expected Outcomes Achievement of increased cardiorespiratory fitness and enhanced flexibility, muscular endurance and strength shown through measurements of functional capacity and personal statement of participant.       Increase Strength and Stamina Yes  develop an exercise routine, learn exercise limitations       Intervention Develop an individualized exercise prescription for aerobic and resistive training based on initial evaluation findings, risk stratification, comorbidities and participant's personal goals.;Provide advice, education, support and counseling about physical activity/exercise needs.       Expected  Outcomes Achievement of increased cardiorespiratory fitness and enhanced flexibility, muscular endurance and strength shown through measurements of functional capacity and personal statement of participant.          Exercise Goals Re-Evaluation :     Exercise Goals Re-Evaluation    Row Name 07/19/16 1634 08/16/16 1116 09/09/16 1715         Exercise Goal Re-Evaluation   Exercise Goals Review Increase Physical Activity;Increase Strenth and Stamina Increase Physical Activity;Increase Strenth and Stamina Increase Physical Activity;Increase Strenth and Stamina     Comments Pt has returned to rehab after being out for a few days due to her catherization, but she continues to do well with exercise.  Pt states that she feels stronger but she doesn't feel she is where she is supposed to be because of her injury.   Pt is doing great with exercise and improved her walk test distance by more than 70%!     Expected Outcomes Continue with exercise prescription and increase workload as tolerated in order to increase exercise tolerance.  Continue with exercise prescription and increase workload as tolerated in order to increase exercise tolerance.  Continue with exercise prescription and increase workload as tolerated in order to increase exercise tolerance.          Discharge Exercise Prescription (Final Exercise Prescription  Changes):     Exercise Prescription Changes - 09/09/16 1700      Response to Exercise   Blood Pressure (Admit) 142/88   Blood Pressure (Exercise) 140/84   Blood Pressure (Exit) 128/82   Heart Rate (Admit) 77 bpm   Heart Rate (Exercise) 108 bpm   Heart Rate (Exit) 59 bpm   Rating of Perceived Exertion (Exercise) 12   Duration Progress to 45 minutes of aerobic exercise without signs/symptoms of physical distress   Intensity THRR unchanged     Progression   Progression Continue to progress workloads to maintain intensity without signs/symptoms of physical distress.   Average  METs 1.9     Resistance Training   Training Prescription No     NuStep   Level 3   SPM 80   Minutes 20   METs 1.8     Arm Ergometer   Level 1   Watts 25   Minutes 10   METs 1.9     Home Exercise Plan   Plans to continue exercise at Home (comment)   Frequency Add 3 additional days to program exercise sessions.      Nutrition:  Target Goals: Understanding of nutrition guidelines, daily intake of sodium 1500mg , cholesterol 200mg , calories 30% from fat and 7% or less from saturated fats, daily to have 5 or more servings of fruits and vegetables.  Biometrics:     Pre Biometrics - 06/17/16 1223      Pre Biometrics   Height 5' 2.25" (1.581 m)   Weight (!)  302 lb 11.1 oz (137.3 kg)   Hip Circumference 64 inches   BMI (Calculated) 55   Triceps Skinfold 70 mm   % Body Fat 65.1 %   Grip Strength 28 kg   Flexibility 8 in   Single Leg Stand 1.03 seconds       Nutrition Therapy Plan and Nutrition Goals:     Nutrition Therapy & Goals - 06/30/16 1140      Nutrition Therapy   Diet Carb Modified, Therapeutic Lifestyle Changes     Personal Nutrition Goals   Nutrition Goal Wt loss of 1-2 lb/week to a wt loss goal of 6-24 lb at graduation from [place], educate and counsel regarding individualized specific dietary modifications aiming towards targeted core components such as weight, hypertension, lipid management, diabetes, heart failure and other comorbidities.   Expected Outcomes Short Term Goal: Understand basic principles of dietary content, such as calories, fat, sodium, cholesterol and nutrients.;Long Term Goal: Adherence to prescribed nutrition plan.      Nutrition Discharge: Nutrition Scores:     Nutrition Assessments - 06/30/16 1142      MEDFICTS Scores   Pre Score 6      Nutrition Goals Re-Evaluation:   Nutrition Goals Re-Evaluation:   Nutrition Goals Discharge (Final Nutrition Goals  Re-Evaluation):   Psychosocial: Target Goals: Acknowledge presence or absence of significant depression and/or stress, maximize coping skills, provide positive support system. Participant is able to verbalize types and ability to use techniques and skills needed for reducing stress and depression.  Initial Review & Psychosocial Screening:     Initial Psych Review & Screening - 06/17/16 1221      Initial Review   Current issues with History of Depression     Family Dynamics   Good Support System? Yes     Barriers   Psychosocial barriers to participate in program The patient should benefit from training  in stress management and relaxation.     Screening Interventions   Interventions Encouraged to exercise;To provide support and resources with identified psychosocial needs;Provide feedback about the scores to participant      Quality of Life Scores:     Quality of Life - 07/05/16 1752      Quality of Life Scores   Health/Function Pre --  Panhia is dissatisfied with her health due to her CAD and diabetes   Psych/Spiritual Pre --  Appointment set up with [facility] hospital chaplain for couselling.        PHQ-9: Recent Review Flowsheet Data    Depression screen [facility] 2/9 06/23/2016 06/24/2015 04/14/2015 02/04/2015 11/25/2014   Decreased Interest 0 1 1 1 1    Down, Depressed, Hopeless 1  1 1 1 1    PHQ - 2 Score 1 2 2 2 2    Altered sleeping - - - - 2   Tired, decreased energy - - - - 3   Change in appetite - - - - 3    Feeling bad or failure about yourself  - - - - 3   Trouble concentrating - - - - 3   Moving slowly or fidgety/restless - - - - 0   Suicidal thoughts - - - - 0   PHQ-9 Score - - - - 16   Difficult doing work/chores - - - - Somewhat difficult     Interpretation of Total Score  Total Score Depression Severity:  1-4 = Minimal depression, 5-9 = Mild depression, 10-14 = Moderate depression, 15-19 = Moderately severe depression, 20-27 = Severe depression    Psychosocial Evaluation and Intervention:   Psychosocial Re-Evaluation:     Psychosocial Re-Evaluation    Row Name 07/19/16 1629 07/19/16 1634 08/16/16 1240 09/15/16 1809       Psychosocial Re-Evaluation   Current issues with History of Depression  - History of Depression History of Depression    Comments  - -  Ivett will be meeting with the hospital chaplian in the near future Celine depression is currently managed Jabree depression is currently managed    Interventions Stress management education;Encouraged to attend Cardiac Rehabilitation for the exercise  - Stress management education;Encouraged to attend Cardiac Rehabilitation for the exercise Stress management education;Encouraged to attend Cardiac Rehabilitation for the exercise    Continue Psychosocial Services   -  - Follow up required by staff Follow up required by staff       Psychosocial Discharge (Final Psychosocial Re-Evaluation):     Psychosocial Re-Evaluation - 09/15/16 1809      Psychosocial Re-Evaluation   Current issues with History of Depression   Comments Doreene depression is currently managed   Interventions Stress management education;Encouraged to attend Cardiac Rehabilitation for the exercise   Continue Psychosocial Services  Follow up required by staff      Vocational Rehabilitation: Provide vocational rehab assistance to qualifying candidates.   Vocational Rehab Evaluation & Intervention:     Vocational Rehab - 06/17/16 1221      Initial Vocational Rehab Evaluation & Intervention   Assessment shows need for Vocational Rehabilitation No      Education: Education Goals: Education classes will be provided on a weekly basis, covering required topics. Participant will state understanding/return demonstration of topics presented.  Learning Barriers/Preferences:     Learning Barriers/Preferences - 06/17/16 0835      Learning Barriers/Preferences   Learning Barriers Sight   Learning  Preferences Skilled Demonstration      Education Topics:  Count Your Pulse:  -Group instruction provided by verbal instruction, demonstration, patient participation and written materials to support subject.  Instructors address importance of being able to find your pulse and how to count your pulse when at home without a heart monitor.  Patients get hands on experience counting their pulse with staff help and individually.   Heart Attack, Angina, and Risk Factor Modification:  -Group instruction provided by verbal instruction, video, and written materials to support subject.  Instructors address signs and symptoms of angina and heart attacks.    Also discuss risk factors for heart disease and how to make changes to improve heart health risk factors.   CARDIAC REHAB PHASE II EXERCISE from 09/08/2016 in [place]  Date  06/23/16  Instruction Review Code  2- meets goals/outcomes      Functional Fitness:  -Group instruction provided by verbal instruction, demonstration, patient participation, and written materials to support subject.  Instructors address safety measures for doing things around the house.  Discuss how to get up and down off the floor, how to pick things up properly, how to safely get out of a chair without assistance, and balance training.   Meditation and Mindfulness:  -Group instruction provided by verbal instruction, patient participation, and written materials to support subject.  Instructor addresses importance of mindfulness and meditation practice to help reduce stress and improve awareness.  Instructor also leads participants through a meditation exercise.    CARDIAC REHAB PHASE II EXERCISE from 09/08/2016 in [place]  Date  07/07/16  Instruction Review Code  2- meets goals/outcomes      Stretching for Flexibility and Mobility:  -Group instruction provided by verbal instruction, patient participation, and  written materials to support subject.  Instructors lead participants through series of stretches that are designed to increase flexibility thus improving mobility.  These stretches are additional exercise for major muscle groups that are typically performed during regular warm up and cool down.   CARDIAC REHAB PHASE II EXERCISE from 09/08/2016 in [place]  Date  07/23/16  Instruction Review Code  2- meets goals/outcomes      Hands Only CPR:  -Group verbal, video, and participation provides a basic overview of AHA guidelines for community CPR. Role-play of emergencies allow participants the opportunity to practice calling for help and chest compression technique with discussion of AED use.   Hypertension: -Group verbal and written instruction that provides a basic overview of hypertension including the most recent diagnostic guidelines, risk factor reduction with self-care instructions and medication management.    Nutrition I class: Heart Healthy Eating:  -Group instruction provided by PowerPoint slides, verbal discussion, and written materials to support subject matter. The instructor gives an explanation and review of the Therapeutic Lifestyle Changes diet recommendations, which includes a discussion on lipid goals, dietary fat, sodium, fiber, plant stanol/sterol esters, sugar, and the components of a well-balanced, healthy diet.   CARDIAC REHAB PHASE II EXERCISE from 09/08/2016 in [place]  Date  07/07/16  Educator  RD  Instruction Review Code  Not applicable [class handouts given]      Nutrition II class: Lifestyle Skills:  -Group instruction provided by PowerPoint slides, verbal discussion, and written materials to support subject matter. The instructor gives an explanation and review of label reading, grocery shopping for heart health, heart healthy recipe modifications, and ways to make healthier choices when eating  out.   CARDIAC REHAB  PHASE II EXERCISE from 09/08/2016 in [place]  Date  07/07/16  Educator  RD  Instruction Review Code  Not applicable [class handouts given]      Diabetes Question & Answer:  -Group instruction provided by PowerPoint slides, verbal discussion, and written materials to support subject matter. The instructor gives an explanation and review of diabetes co-morbidities, pre- and post-prandial blood glucose goals, pre-exercise blood glucose goals, signs, symptoms, and treatment of hypoglycemia and hyperglycemia, and foot care basics.   CARDIAC REHAB PHASE II EXERCISE from 09/08/2016 in [place]  Date  09/03/16  Educator  RD  Instruction Review Code  2- meets goals/outcomes      Diabetes Blitz:  -Group instruction provided by PowerPoint slides, verbal discussion, and written materials to support subject matter. The instructor gives an explanation and review of the physiology behind type 1 and type 2 diabetes, diabetes medications and rational behind using different medications, pre- and post-prandial blood glucose recommendations and Hemoglobin A1c goals, diabetes diet, and exercise including blood glucose guidelines for exercising safely.    CARDIAC REHAB PHASE II EXERCISE from 09/08/2016 in [place]  Date  07/07/16  Educator  RD  Instruction Review Code  Not applicable [class handouts given]      Portion Distortion:  -Group instruction provided by PowerPoint slides, verbal discussion, written materials, and food models to support subject matter. The instructor gives an explanation of serving size versus portion size, changes in portions sizes over the last 20 years, and what consists of a serving from each food group.   CARDIAC REHAB PHASE II EXERCISE from 09/08/2016 in [place]  Date  07/21/16  Educator  RD  Instruction Review Code  2- meets  goals/outcomes      Stress Management:  -Group instruction provided by verbal instruction, video, and written materials to support subject matter.  Instructors review role of stress in heart disease and how to cope with stress positively.     CARDIAC REHAB PHASE II EXERCISE from 09/08/2016 in [place]  Date  07/28/16  Instruction Review Code  2- meets goals/outcomes      Exercising on Your Own:  -Group instruction provided by verbal instruction, power point, and written materials to support subject.  Instructors discuss benefits of exercise, components of exercise, frequency and intensity of exercise, and end points for exercise.  Also discuss use of nitroglycerin and activating EMS.  Review options of places to exercise outside of rehab.  Review guidelines for sex with heart disease.   Cardiac Drugs I:  -Group instruction provided by verbal instruction and written materials to support subject.  Instructor reviews cardiac drug classes: antiplatelets, anticoagulants, beta blockers, and statins.  Instructor discusses reasons, side effects, and lifestyle considerations for each drug class.   CARDIAC REHAB PHASE II EXERCISE from 09/08/2016 in [place]  Date  09/08/16  Instruction Review Code  2- meets goals/outcomes      Cardiac Drugs II:  -Group instruction provided by verbal instruction and written materials to support subject.  Instructor reviews cardiac drug classes: angiotensin converting enzyme inhibitors (ACE-I), angiotensin II receptor blockers (ARBs), nitrates, and calcium channel blockers.  Instructor discusses reasons, side effects, and lifestyle considerations for each drug class.   Anatomy and Physiology of the Circulatory System:  Group verbal and written instruction and models provide basic cardiac anatomy and physiology, with the  coronary electrical and arterial systems. Review of: AMI, Angina, Valve disease,  Heart Failure, Peripheral Artery Disease, Cardiac Arrhythmia, Pacemakers, and the ICD.   CARDIAC REHAB PHASE II EXERCISE from 09/08/2016 in [place]  Date  06/30/16  Instruction Review Code  2- meets goals/outcomes      Other Education:  -Group or individual verbal, written, or video instructions that support the educational goals of the cardiac rehab program.   Knowledge Questionnaire Score:     Knowledge Questionnaire Score - 06/17/16 1115      Knowledge Questionnaire Score   Pre Score 23/24      Core Components/Risk Factors/Patient Goals at Admission:     Personal Goals and Risk Factors at Admission - 06/17/16 0854      Core Components/Risk Factors/Patient Goals on Admission    Weight Management Yes;Obesity;Weight Maintenance;Weight Loss   Intervention Weight Management: Develop a combined nutrition and exercise program designed to reach desired caloric intake, while maintaining appropriate intake of nutrient and fiber, sodium and fats, and appropriate energy expenditure required for the weight goal.;Weight Management: Provide education and appropriate resources to help participant work on and attain dietary goals.;Weight Management/Obesity: Establish reasonable short term and long term weight goals.;Obesity: Provide education and appropriate resources to help participant work on and attain dietary goals.   Expected Outcomes Long Term: Adherence to nutrition and physical activity/exercise program aimed toward attainment of established weight goal;Short Term: Continue to assess and modify interventions until short term weight is achieved;Weight Maintenance: Understanding of the daily nutrition guidelines, which includes 25-35% calories from fat, 7% or less cal from saturated fats, less than 200mg  cholesterol, less than 1.5gm of sodium, & 5 or more servings of fruits and vegetables daily;Weight Loss: Understanding of general recommendations for a balanced  deficit meal plan, which promotes 1-2 lb weight loss per week and includes a negative energy balance of [phone] kcal/d;Understanding recommendations for meals to include 15-35% energy as protein, 25-35% energy from fat, 35-60% energy from carbohydrates, less than 200mg  of dietary cholesterol, 20-35 gm of total fiber daily;Understanding of distribution of calorie intake throughout the day with the consumption of 4-5 meals/snacks   Improve shortness of breath with ADL's Yes   Intervention Provide education, individualized exercise plan and daily activity instruction to help decrease symptoms of SOB with activities of daily living.   Expected Outcomes Short Term: Achieves a reduction of symptoms when performing activities of daily living.   Diabetes Yes   Intervention Provide education about signs/symptoms and action to take for hypo/hyperglycemia.;Provide education about proper nutrition, including hydration, and aerobic/resistive exercise prescription along with prescribed medications to achieve blood glucose in normal ranges: Fasting glucose 65-99 mg/dL   Expected Outcomes Short Term: Participant verbalizes understanding of the signs/symptoms and immediate care of hyper/hypoglycemia, proper foot care and importance of medication, aerobic/resistive exercise and nutrition plan for blood glucose control.;Long Term: Attainment of HbA1C < 7%.   Heart Failure Yes   Intervention Provide a combined exercise and nutrition program that is supplemented with education, support and counseling about heart failure. Directed toward relieving symptoms such as shortness of breath, decreased exercise tolerance, and extremity edema.   Expected Outcomes Improve functional capacity of life;Short term: Attendance in program 2-3 days a week with increased exercise capacity. Reported lower sodium intake. Reported increased fruit and vegetable intake. Reports medication compliance.;Short term: Daily weights obtained and reported  for increase. Utilizing diuretic protocols set by physician.;Long term: Adoption of self-care skills and reduction of barriers for  early signs and symptoms recognition and intervention leading to self-care maintenance.      Core Components/Risk Factors/Patient Goals Review:    Core Components/Risk Factors/Patient Goals at Discharge (Final Review):    ITP Comments:     ITP Comments    Row Name 06/17/16 0826           ITP Comments Medical Director, Dr. Fransico Him          Comments: Starr is making expected progress toward personal goals after completing 26 sessions. Recommend continued exercise and life style modification education including  stress management and relaxation techniques to decrease cardiac risk profile. Josiephine continues to enjoy participating in phase 2 cardiac rehab. Will check into getting a portable oxygen tank so that Maniyah can exercise at the gym near her home when she graduates from cardiac rehab.Barnet Pall, RN,BSN 09/15/2016 6:16 PM

## 2016-09-17 ENCOUNTER — Encounter (HOSPITAL_COMMUNITY)
Admission: RE | Admit: 2016-09-17 | Discharge: 2016-09-17 | Disposition: A | Discharge status: Home / Self Care | Payer: Medicare Other | Source: Ambulatory Visit | Attending: Cardiology | Admitting: Cardiology

## 2016-09-17 DIAGNOSIS — R0902 Hypoxemia: Secondary | ICD-10-CM | POA: Diagnosis not present

## 2016-09-17 DIAGNOSIS — I208 Other forms of angina pectoris: Secondary | ICD-10-CM

## 2016-09-20 ENCOUNTER — Encounter (HOSPITAL_COMMUNITY)
Admission: RE | Admit: 2016-09-20 | Discharge: 2016-09-20 | Disposition: A | Discharge status: Home / Self Care | Payer: Medicare Other | Source: Ambulatory Visit | Attending: Cardiology | Admitting: Cardiology

## 2016-09-20 DIAGNOSIS — R0902 Hypoxemia: Secondary | ICD-10-CM | POA: Diagnosis not present

## 2016-09-20 DIAGNOSIS — I208 Other forms of angina pectoris: Secondary | ICD-10-CM

## 2016-09-21 ENCOUNTER — Telehealth (HOSPITAL_COMMUNITY): Payer: Self-pay

## 2016-09-21 NOTE — Telephone Encounter (Signed)
Patient left VM returning call to [place] in CHF clinic. Do not see any phone notes where she attempted to call patient. Attempted to return call to patient myself, no answer.  Renee Pain, RN

## 2016-09-22 ENCOUNTER — Encounter (HOSPITAL_COMMUNITY)
Admission: RE | Admit: 2016-09-22 | Discharge: 2016-09-22 | Disposition: A | Discharge status: Home / Self Care | Payer: Medicare Other | Source: Ambulatory Visit | Attending: Cardiology | Admitting: Cardiology

## 2016-09-22 DIAGNOSIS — R0902 Hypoxemia: Secondary | ICD-10-CM | POA: Diagnosis not present

## 2016-09-22 DIAGNOSIS — I208 Other forms of angina pectoris: Secondary | ICD-10-CM

## 2016-09-24 ENCOUNTER — Encounter (HOSPITAL_COMMUNITY)
Admission: RE | Admit: 2016-09-24 | Discharge: 2016-09-24 | Disposition: A | Discharge status: Home / Self Care | Payer: Medicare Other | Source: Ambulatory Visit | Attending: Cardiology | Admitting: Cardiology

## 2016-09-24 DIAGNOSIS — I2089 Other forms of angina pectoris: Secondary | ICD-10-CM

## 2016-09-24 DIAGNOSIS — I208 Other forms of angina pectoris: Secondary | ICD-10-CM

## 2016-09-24 DIAGNOSIS — R0902 Hypoxemia: Secondary | ICD-10-CM | POA: Diagnosis not present

## 2016-09-27 ENCOUNTER — Encounter (HOSPITAL_COMMUNITY)
Admission: RE | Admit: 2016-09-27 | Discharge: 2016-09-27 | Disposition: A | Discharge status: Home / Self Care | Payer: Medicare Other | Source: Ambulatory Visit | Attending: Cardiology | Admitting: Cardiology

## 2016-09-27 ENCOUNTER — Telehealth: Payer: Self-pay | Admitting: Pulmonary Disease

## 2016-09-27 DIAGNOSIS — R0902 Hypoxemia: Secondary | ICD-10-CM | POA: Diagnosis not present

## 2016-09-27 DIAGNOSIS — I208 Other forms of angina pectoris: Secondary | ICD-10-CM

## 2016-09-27 NOTE — Telephone Encounter (Signed)
lmtcb X1 for [name] [name] at pulm rehab

## 2016-09-28 NOTE — Telephone Encounter (Signed)
[name] [name] with Pulm Rehab is returning phone call.

## 2016-09-28 NOTE — Telephone Encounter (Signed)
Left message for Molly to call back. 

## 2016-09-29 ENCOUNTER — Encounter (HOSPITAL_COMMUNITY)
Admission: RE | Admit: 2016-09-29 | Discharge: 2016-09-29 | Disposition: A | Discharge status: Home / Self Care | Payer: Medicare Other | Source: Ambulatory Visit | Attending: Cardiology | Admitting: Cardiology

## 2016-09-29 DIAGNOSIS — R0902 Hypoxemia: Secondary | ICD-10-CM | POA: Diagnosis not present

## 2016-09-29 DIAGNOSIS — I208 Other forms of angina pectoris: Secondary | ICD-10-CM

## 2016-09-29 DIAGNOSIS — I2089 Other forms of angina pectoris: Secondary | ICD-10-CM

## 2016-09-29 NOTE — Telephone Encounter (Signed)
Will likely need ov since looks like she is only on noct o2  LMTCB for [facility]

## 2016-09-29 NOTE — Progress Notes (Signed)
Discharge Progress Report  Patient Details  Name: [name] [name] [name]: [id number] Date of Birth: [birth year] Referring Provider:     [place] from 06/17/2016 in [place]  Referring Provider  Loralie Champagne MD       Number of Visits: 36  Reason for Discharge:  Patient reached a stable level of exercise. Patient independent in their exercise.  Smoking History:  History  Smoking Status  . Never Smoker  Smokeless Tobacco  . Never Used    Diagnosis:  Stable angina ([place])  ADL UCSD:   Initial Exercise Prescription:     Initial Exercise Prescription - 06/17/16 1100      Date of Initial Exercise RX and Referring Provider   Date 06/17/16   Referring Provider Loralie Champagne MD     NuStep   Level 1   SPM 80   Minutes 20   METs 1.8     Arm Ergometer   Level 1   Watts 25   Minutes 10   METs 1.9     Prescription Details   Frequency (times per week) 3   Duration Progress to 45 minutes of aerobic exercise without signs/symptoms of physical distress     Intensity   THRR 40-80% of Max Heartrate 61-122   Ratings of Perceived Exertion 11-13   Perceived Dyspnea 0-4     Progression   Progression Continue to progress workloads to maintain intensity without signs/symptoms of physical distress.     Resistance Training   Training Prescription Yes   Weight 2   Reps 10-15      Discharge Exercise Prescription (Final Exercise Prescription Changes):     Exercise Prescription Changes - 10/01/16 0951      Response to Exercise   Blood Pressure (Admit) 128/78   Blood Pressure (Exercise) 118/60   Blood Pressure (Exit) 120/80   Heart Rate (Admit) 65 bpm   Heart Rate (Exercise) 75 bpm   Heart Rate (Exit) 65 bpm   Rating of Perceived Exertion (Exercise) 13   Symptoms none   Duration Progress to 45 minutes of aerobic exercise without signs/symptoms of physical distress   Intensity THRR unchanged     Progression    Progression Continue to progress workloads to maintain intensity without signs/symptoms of physical distress.   Average METs 2     Resistance Training   Training Prescription No     Interval Training   Interval Training No     Oxygen   Oxygen Continuous   Liters 2     NuStep   Level 3   SPM 80   Minutes 20   METs 2     Arm Ergometer   Level 1   Watts 25   Minutes 10   METs 1.96     Home Exercise Plan   Plans to continue exercise at Home (comment)   Frequency Add 3 additional days to program exercise sessions.   Initial Home Exercises Provided 07/26/16      Functional Capacity:     6 Minute Walk    Row Name 06/17/16 1117 10/01/16 1000       6 Minute Walk   Phase Initial Discharge  Walk tested completed on 09/08/16.    Distance 463 feet 800 feet    Distance % Change  - 72.8 %    Distance Feet Change  - 337 ft    Walk Time 6 minutes 6 minutes    # of Rest  Breaks 0 0    MPH 0.9 1.5    METS 0.6 1.4    RPE 17 16    VO2 Peak 2.1 4.9    Symptoms No No    Resting HR 76 bpm 75 bpm    Resting BP 140/70 120/76    Resting Oxygen Saturation   - 95 %    Exercise Oxygen Saturation  during 6 min walk  - 96 %    Max Ex. HR 96 bpm 115 bpm    Max Ex. BP 208/96 198/88    2 Minute Post BP 165/65  -       Psychological, QOL, Others - Outcomes: PHQ 2/9: Depression screen [facility] 2/9 10/28/2016 09/29/2016 06/23/2016 06/24/2015 04/14/2015  Decreased Interest 0 0 0 1 1  Down, Depressed, Hopeless - 0 '1 1 1  ' PHQ - 2 Score 0 0 '1 2 2  ' Altered sleeping - - - - -  Tired, decreased energy - - - - -  Change in appetite - - - - -  Feeling bad or failure about yourself  - - - - -  Trouble concentrating - - - - -  Moving slowly or fidgety/restless - - - - -  Suicidal thoughts - - - - -  PHQ-9 Score - - - - -  Difficult doing work/chores - - - - -  Some recent data might be hidden    Quality of Life:     Quality of Life - 10/01/16 1100      Quality of Life Scores   Health/Function  Pre 19.4 %   Health/Function Post 7 %   Health/Function % Change -63.92 %   Socioeconomic Pre 24.58 %   Socioeconomic Post 25.75 %   Socioeconomic % Change  4.76 %   Psych/Spiritual Pre 19 %   Psych/Spiritual Post 8.79 %   Psych/Spiritual % Change -53.74 %   Family Pre 28 %   Family Post 27.6 %   Family % Change -1.43 %   GLOBAL Pre 21.56 %   GLOBAL Post 13.91 %   GLOBAL % Change -35.48 %      Personal Goals: Goals established at orientation with interventions provided to work toward goal.     Personal Goals and Risk Factors at Admission - 06/17/16 0854      Core Components/Risk Factors/Patient Goals on Admission    Weight Management Yes;Obesity;Weight Maintenance;Weight Loss   Intervention Weight Management: Develop a combined nutrition and exercise program designed to reach desired caloric intake, while maintaining appropriate intake of nutrient and fiber, sodium and fats, and appropriate energy expenditure required for the weight goal.;Weight Management: Provide education and appropriate resources to help participant work on and attain dietary goals.;Weight Management/Obesity: Establish reasonable short term and long term weight goals.;Obesity: Provide education and appropriate resources to help participant work on and attain dietary goals.   Expected Outcomes Long Term: Adherence to nutrition and physical activity/exercise program aimed toward attainment of established weight goal;Short Term: Continue to assess and modify interventions until short term weight is achieved;Weight Maintenance: Understanding of the daily nutrition guidelines, which includes 25-35% calories from fat, 7% or less cal from saturated fats, less than 275m cholesterol, less than 1.5gm of sodium, & 5 or more servings of fruits and vegetables daily;Weight Loss: Understanding of general recommendations for a balanced deficit meal plan, which promotes 1-2 lb weight loss per week and includes a negative energy  balance of [phone] kcal/d;Understanding recommendations for meals to include 15-35% energy  as protein, 25-35% energy from fat, 35-60% energy from carbohydrates, less than 261m of dietary cholesterol, 20-35 gm of total fiber daily;Understanding of distribution of calorie intake throughout the day with the consumption of 4-5 meals/snacks   Improve shortness of breath with ADL's Yes   Intervention Provide education, individualized exercise plan and daily activity instruction to help decrease symptoms of SOB with activities of daily living.   Expected Outcomes Short Term: Achieves a reduction of symptoms when performing activities of daily living.   Diabetes Yes   Intervention Provide education about signs/symptoms and action to take for hypo/hyperglycemia.;Provide education about proper nutrition, including hydration, and aerobic/resistive exercise prescription along with prescribed medications to achieve blood glucose in normal ranges: Fasting glucose 65-99 mg/dL   Expected Outcomes Short Term: Participant verbalizes understanding of the signs/symptoms and immediate care of hyper/hypoglycemia, proper foot care and importance of medication, aerobic/resistive exercise and nutrition plan for blood glucose control.;Long Term: Attainment of HbA1C < 7%.   Heart Failure Yes   Intervention Provide a combined exercise and nutrition program that is supplemented with education, support and counseling about heart failure. Directed toward relieving symptoms such as shortness of breath, decreased exercise tolerance, and extremity edema.   Expected Outcomes Improve functional capacity of life;Short term: Attendance in program 2-3 days a week with increased exercise capacity. Reported lower sodium intake. Reported increased fruit and vegetable intake. Reports medication compliance.;Short term: Daily weights obtained and reported for increase. Utilizing diuretic protocols set by physician.;Long term: Adoption of self-care  skills and reduction of barriers for early signs and symptoms recognition and intervention leading to self-care maintenance.       Personal Goals Discharge:     Goals and Risk Factor Review    Row Name 10/28/16 1108             Core Components/Risk Factors/Patient Goals Review   Personal Goals Review Weight Management/Obesity;Heart Failure;Lipids;Diabetes;Hypertension;Improve shortness of breath with ADL's       Review ALenayain general did well with exercise. Aireana's vital signs were mostly stable. Merrin exercised on oxygen to decrease anginal symptoms and increase her exercise tolerance       Expected Outcomes [place] take her medications as presribed to manage her risk factors. AMaddelynnwill continue to loose weight and have better controlled CBG's          Exercise Goals and Review:     Exercise Goals    Row Name 06/17/16 0837             Exercise Goals   Increase Physical Activity Yes  increase functional mobility       Intervention Provide advice, education, support and counseling about physical activity/exercise needs.;Develop an individualized exercise prescription for aerobic and resistive training based on initial evaluation findings, risk stratification, comorbidities and participant's personal goals.       Expected Outcomes Achievement of increased cardiorespiratory fitness and enhanced flexibility, muscular endurance and strength shown through measurements of functional capacity and personal statement of participant.       Increase Strength and Stamina Yes  develop an exercise routine, learn exercise limitations       Intervention Develop an individualized exercise prescription for aerobic and resistive training based on initial evaluation findings, risk stratification, comorbidities and participant's personal goals.;Provide advice, education, support and counseling about physical activity/exercise needs.        Expected Outcomes Achievement of increased cardiorespiratory fitness and enhanced flexibility, muscular endurance  and strength shown through measurements of functional capacity and personal statement of participant.          Nutrition & Weight - Outcomes:     Pre Biometrics - 06/17/16 1223      Pre Biometrics   Height 5' 2.25" (1.581 m)   Weight (!)  302 lb 11.1 oz (137.3 kg)   Hip Circumference 64 inches   BMI (Calculated) 55   Triceps Skinfold 70 mm   % Body Fat 65.1 %   Grip Strength 28 kg   Flexibility 8 in   Single Leg Stand 1.03 seconds         Post Biometrics - 10/01/16 1100       Post  Biometrics   Height 5' 2.25" (1.581 m)   Weight (!)  300 lb 7.8 oz (136.3 kg)   Waist Circumference 49 inches   Hip Circumference 64.5 inches   Waist to Hip Ratio 0.76 %   BMI (Calculated) 54.53   Triceps Skinfold 66 mm   % Body Fat 64.2 %   Grip Strength 29 kg   Flexibility 6 in   Single Leg Stand 1.22 seconds      Nutrition:     Nutrition Therapy & Goals - 06/30/16 1140      Nutrition Therapy   Diet Carb Modified, Therapeutic Lifestyle Changes     Personal Nutrition Goals   Nutrition Goal Wt loss of 1-2 lb/week to a wt loss goal of 6-24 lb at graduation from [place], educate and counsel regarding individualized specific dietary modifications aiming towards targeted core components such as weight, hypertension, lipid management, diabetes, heart failure and other comorbidities.   Expected Outcomes Short Term Goal: Understand basic principles of dietary content, such as calories, fat, sodium, cholesterol and nutrients.;Long Term Goal: Adherence to prescribed nutrition plan.      Nutrition Discharge:     Nutrition Assessments - 10/27/16 1034      MEDFICTS Scores   Pre Score 6   Post Score 0   Score Difference -6      Education Questionnaire Score:     Knowledge Questionnaire Score - 10/01/16 1100       Knowledge Questionnaire Score   Pre Score 23/24   Post Score 19/24      Goals reviewed with patient; copy given to patient. Jessilynn graduated from cardiac rehab program today with completion of 36 exercise sessions in Phase II. Pt maintained good attendance and progressed nicely during his participation in rehab as evidenced by increased MET level.   Medication list reconciled. Repeat  PHQ score- 0 . Alazay has a history of depression . Brittnay's depression is currently controlled on her current medication regimen. Pt has made significant lifestyle changes and should be commended for her success. Pt feels she has achieved her goals during cardiac rehab. Linzey exercised on 2l/min of oxygen via nasal cannula per Dr [name] to decrease her anginal symptoms during exercise at cardiac rehab.Makira maintained stable oxygen saturations during exercise and had minimal anginal episodes at cardiac rehab.  [facility] We contacted [facility] pulmonologist to see if Honor can get oxygen to use  with exercise when she goes to the gym. Pecolia currently uses oxygen at night but does not use oxygen during the day .Nichoel increased her distance on her post walk test and lost 2 pounds. We Are proud of Cybil's progress and hope  that she will be able to continue her fitness regimen to maintain her continued independence.Barnet Pall, RN,BSN 10/28/2016 11:22 AM

## 2016-09-29 NOTE — Telephone Encounter (Signed)
Attempted to call and speak with [name] [name] this morning but there was no answer.  Will try back later.

## 2016-09-30 NOTE — Telephone Encounter (Signed)
Called and spoke with Lattie Haw and [place] and she stated that Cloyde Reams will be back on Monday---will need to call and speak with [facility] at that time about this pt.

## 2016-10-01 ENCOUNTER — Encounter (HOSPITAL_COMMUNITY)
Admission: RE | Admit: 2016-10-01 | Discharge: 2016-10-01 | Disposition: A | Discharge status: Home / Self Care | Payer: Medicare Other | Source: Ambulatory Visit | Attending: Cardiology | Admitting: Cardiology

## 2016-10-01 VITALS — BP 128/78 | HR 65 | Ht 62.25 in | Wt 300.5 lb

## 2016-10-01 DIAGNOSIS — I208 Other forms of angina pectoris: Secondary | ICD-10-CM

## 2016-10-01 DIAGNOSIS — R0902 Hypoxemia: Secondary | ICD-10-CM | POA: Diagnosis not present

## 2016-10-05 ENCOUNTER — Telehealth: Payer: Self-pay | Admitting: Pulmonary Disease

## 2016-10-05 NOTE — Telephone Encounter (Signed)
Lm for molly to call us back about this pt.

## 2016-10-05 NOTE — Telephone Encounter (Signed)
Will call for [facility] tomorrow

## 2016-10-06 NOTE — Telephone Encounter (Signed)
Called and spoke with [name] [name] at pulmonary rehab and she is aware that in order for our office to write for the POC to use with exercise, she would have to come in for a walk and drop her sats in order to qualify for the oxygen.  [name] [name] stated that Dr. [name] wrote the order for her to have oxygen with exercise to prevent pt from having angina.  She will contact Dr. [name] to see if they will write for this.

## 2016-10-07 NOTE — Telephone Encounter (Signed)
Please see telephone encounter dated 10/05/2016. Per triage protocol, message will be closed.

## 2016-10-11 ENCOUNTER — Encounter (HOSPITAL_COMMUNITY): Payer: Medicare Other | Admitting: Cardiology

## 2016-10-14 ENCOUNTER — Telehealth: Payer: Self-pay | Admitting: Pulmonary Disease

## 2016-10-14 ENCOUNTER — Telehealth (HOSPITAL_COMMUNITY): Payer: Self-pay

## 2016-10-14 NOTE — Telephone Encounter (Signed)
Spoke with pt, she states she hurt her back while trying to get her oxygen tank and she is having pain causing her not to be able to sit up. She is unable to get in her car because she has a Printmaker and she has to step up into it. I advised her to call when she feels she is able to and she agreed to get the 6 minute walk done as soon as she can.

## 2016-10-14 NOTE — Telephone Encounter (Signed)
Called pt to let her know she did not need a 6 minute walk test. Pt understood and nothing further is needed.

## 2016-10-14 NOTE — Telephone Encounter (Signed)
FYI Dr. Elsworth Soho.

## 2016-10-14 NOTE — Telephone Encounter (Signed)
Notes recorded by Shirley Muscat, RN on 10/14/2016 at 1:32 PM EDT Pt aware of results ------  Notes recorded by Scarlette Calico, RN on 10/06/2016 at 11:08 AM EDT Left message to call back ------  Notes recorded by Darron Doom, RN on 09/21/2016 at 8:26 AM EDT Called and left message for her to call us back. ------  Notes recorded by Larey Dresser, MD on 09/19/2016 at 9:45 PM EDT No significant abnormalities.

## 2016-10-14 NOTE — Telephone Encounter (Signed)
I did not order 6 minute walk test. She is on nocturnal oxygen only per my last note

## 2016-10-26 NOTE — Addendum Note (Signed)
Encounter addended by: Sol Passer on: 10/26/2016  1:00 PM<BR>    Actions taken: Flowsheet data copied forward, Flowsheet accepted

## 2016-10-26 NOTE — Addendum Note (Signed)
Encounter addended by: Sol Passer on: 10/26/2016 12:26 PM<BR>    Actions taken: Flowsheet data copied forward, Visit Navigator Flowsheet section accepted, Vitals modified

## 2016-10-27 NOTE — Addendum Note (Signed)
Encounter addended by: Jewel Baize, RD on: 10/27/2016 10:36 AM<BR>    Actions taken: Flowsheet data copied forward, Visit Navigator Flowsheet section accepted

## 2016-10-28 ENCOUNTER — Telehealth (HOSPITAL_COMMUNITY): Payer: Self-pay | Admitting: *Deleted

## 2016-10-28 NOTE — Addendum Note (Signed)
Encounter addended by: Magda Kiel, RN on: 10/28/2016 12:20 PM<BR>    Actions taken: Sign clinical note

## 2016-11-12 ENCOUNTER — Encounter (HOSPITAL_COMMUNITY): Payer: Medicare Other | Admitting: Cardiology

## 2016-12-09 ENCOUNTER — Other Ambulatory Visit: Payer: Self-pay | Admitting: Internal Medicine

## 2016-12-09 ENCOUNTER — Other Ambulatory Visit: Payer: Self-pay | Admitting: Cardiology

## 2016-12-17 ENCOUNTER — Encounter (HOSPITAL_COMMUNITY): Payer: Self-pay | Admitting: Cardiology

## 2016-12-17 ENCOUNTER — Other Ambulatory Visit: Payer: Self-pay

## 2016-12-17 ENCOUNTER — Ambulatory Visit (HOSPITAL_COMMUNITY)
Admission: RE | Admit: 2016-12-17 | Discharge: 2016-12-17 | Disposition: A | Discharge status: Home / Self Care | Payer: Medicare Other | Source: Ambulatory Visit | Attending: Cardiology | Admitting: Cardiology

## 2016-12-17 VITALS — BP 179/48 | HR 70 | Wt 308.8 lb

## 2016-12-17 DIAGNOSIS — I739 Peripheral vascular disease, unspecified: Secondary | ICD-10-CM

## 2016-12-17 DIAGNOSIS — R079 Chest pain, unspecified: Secondary | ICD-10-CM | POA: Diagnosis not present

## 2016-12-17 DIAGNOSIS — R42 Dizziness and giddiness: Secondary | ICD-10-CM | POA: Insufficient documentation

## 2016-12-17 DIAGNOSIS — R002 Palpitations: Secondary | ICD-10-CM | POA: Insufficient documentation

## 2016-12-17 DIAGNOSIS — N189 Chronic kidney disease, unspecified: Secondary | ICD-10-CM | POA: Diagnosis not present

## 2016-12-17 DIAGNOSIS — Z951 Presence of aortocoronary bypass graft: Secondary | ICD-10-CM | POA: Insufficient documentation

## 2016-12-17 DIAGNOSIS — Z8673 Personal history of transient ischemic attack (TIA), and cerebral infarction without residual deficits: Secondary | ICD-10-CM | POA: Diagnosis not present

## 2016-12-17 DIAGNOSIS — Z7902 Long term (current) use of antithrombotics/antiplatelets: Secondary | ICD-10-CM | POA: Insufficient documentation

## 2016-12-17 DIAGNOSIS — G4733 Obstructive sleep apnea (adult) (pediatric): Secondary | ICD-10-CM | POA: Insufficient documentation

## 2016-12-17 DIAGNOSIS — Z9981 Dependence on supplemental oxygen: Secondary | ICD-10-CM | POA: Insufficient documentation

## 2016-12-17 DIAGNOSIS — Z79899 Other long term (current) drug therapy: Secondary | ICD-10-CM | POA: Insufficient documentation

## 2016-12-17 DIAGNOSIS — E1122 Type 2 diabetes mellitus with diabetic chronic kidney disease: Secondary | ICD-10-CM | POA: Insufficient documentation

## 2016-12-17 DIAGNOSIS — Z794 Long term (current) use of insulin: Secondary | ICD-10-CM | POA: Insufficient documentation

## 2016-12-17 DIAGNOSIS — E1143 Type 2 diabetes mellitus with diabetic autonomic (poly)neuropathy: Secondary | ICD-10-CM | POA: Diagnosis not present

## 2016-12-17 DIAGNOSIS — K449 Diaphragmatic hernia without obstruction or gangrene: Secondary | ICD-10-CM | POA: Insufficient documentation

## 2016-12-17 DIAGNOSIS — Z85820 Personal history of malignant melanoma of skin: Secondary | ICD-10-CM | POA: Insufficient documentation

## 2016-12-17 DIAGNOSIS — I25111 Atherosclerotic heart disease of native coronary artery with angina pectoris with documented spasm: Secondary | ICD-10-CM | POA: Diagnosis not present

## 2016-12-17 DIAGNOSIS — I350 Nonrheumatic aortic (valve) stenosis: Secondary | ICD-10-CM | POA: Insufficient documentation

## 2016-12-17 DIAGNOSIS — I2510 Atherosclerotic heart disease of native coronary artery without angina pectoris: Secondary | ICD-10-CM | POA: Diagnosis present

## 2016-12-17 DIAGNOSIS — E785 Hyperlipidemia, unspecified: Secondary | ICD-10-CM | POA: Diagnosis not present

## 2016-12-17 DIAGNOSIS — F419 Anxiety disorder, unspecified: Secondary | ICD-10-CM | POA: Diagnosis not present

## 2016-12-17 DIAGNOSIS — I493 Ventricular premature depolarization: Secondary | ICD-10-CM | POA: Insufficient documentation

## 2016-12-17 DIAGNOSIS — Z9889 Other specified postprocedural states: Secondary | ICD-10-CM | POA: Diagnosis not present

## 2016-12-17 DIAGNOSIS — Z8249 Family history of ischemic heart disease and other diseases of the circulatory system: Secondary | ICD-10-CM | POA: Diagnosis not present

## 2016-12-17 DIAGNOSIS — Z955 Presence of coronary angioplasty implant and graft: Secondary | ICD-10-CM | POA: Insufficient documentation

## 2016-12-17 DIAGNOSIS — I10 Essential (primary) hypertension: Secondary | ICD-10-CM | POA: Diagnosis not present

## 2016-12-17 DIAGNOSIS — I130 Hypertensive heart and chronic kidney disease with heart failure and stage 1 through stage 4 chronic kidney disease, or unspecified chronic kidney disease: Secondary | ICD-10-CM | POA: Diagnosis not present

## 2016-12-17 DIAGNOSIS — I5032 Chronic diastolic (congestive) heart failure: Secondary | ICD-10-CM | POA: Insufficient documentation

## 2016-12-17 DIAGNOSIS — K219 Gastro-esophageal reflux disease without esophagitis: Secondary | ICD-10-CM | POA: Insufficient documentation

## 2016-12-17 DIAGNOSIS — I6522 Occlusion and stenosis of left carotid artery: Secondary | ICD-10-CM | POA: Diagnosis not present

## 2016-12-17 DIAGNOSIS — Z7982 Long term (current) use of aspirin: Secondary | ICD-10-CM | POA: Diagnosis not present

## 2016-12-17 LAB — BASIC METABOLIC PANEL
Anion gap: 6 (ref 5–15)
BUN: 27 mg/dL — ABNORMAL HIGH (ref 6–20)
CO2: 29 mmol/L (ref 22–32)
Calcium: 9.3 mg/dL (ref 8.9–10.3)
Chloride: 102 mmol/L (ref 101–111)
Creatinine, Ser: 1.11 mg/dL — ABNORMAL HIGH (ref 0.44–1.00)
GFR calc Af Amer: 57 mL/min — ABNORMAL LOW (ref >60)
GFR calc non Af Amer: 49 mL/min — ABNORMAL LOW (ref >60)
Glucose, Bld: 204 mg/dL — ABNORMAL HIGH (ref 65–99)
Potassium: 5.1 mmol/L (ref 3.5–5.1)
Sodium: 137 mmol/L (ref 135–145)

## 2016-12-17 LAB — LIPID PANEL
Cholesterol: 120 mg/dL (ref 0–200)
HDL: 55 mg/dL (ref >40)
LDL Cholesterol: 36 mg/dL (ref 0–99)
Total CHOL/HDL Ratio: 2.2 RATIO
Triglycerides: 145 mg/dL (ref <150)
VLDL: 29 mg/dL (ref 0–40)

## 2016-12-17 MED ORDER — TORSEMIDE 20 MG PO TABS: ORAL_TABLET | ORAL | 3 refills | Status: DC

## 2016-12-17 NOTE — Patient Instructions (Signed)
Increase Torsemide 80 mg (4 tabs) in AM, then 40 mg (2 tabs) in PM   Labs drawn today (if we do not call you, then your lab work was stable)   Your physician recommends that you return for lab work in: 10 days  Your physician has requested that you have an echocardiogram. Echocardiography is a painless test that uses sound waves to create images of your heart. It provides your doctor with information about the size and shape of your heart and how well your heart's chambers and valves are working. This procedure takes approximately one hour. There are no restrictions for this procedure.  You have been referred to Dr. Scot Dock for artery blockage  Your physician recommends that you schedule a follow-up appointment in: 1 month with Dr. [name]

## 2016-12-17 NOTE — Progress Notes (Signed)
Patient ID: [name] [name], female   DOB: 07/27/1945, 71 y.o.   [name]: [id number] PCP: Dr. Sandi Mariscal Cardiology: Dr. [name]  71 y.o. with history of CAD s/p CABG, diastolic CHF, and cerebrovascular disease with history of CVA presents for followup of CHF and diastolic CHF. She had CABG x 5 in 12/11.  Prior to the CABG she had multiple PCIs.  She had a CVA in 2/14 that presented as visual loss.  She had a left carotid stent in 2/15.   Left heart cath done in Sept. 2014. This showed patent SVG-D, LIMA-LAD, and sequential SVG-OM branches but SVG-RCA and the native RCA were both totally occluded. It was felt aht her increased symptoms coincided with occlusion of SVG-RCA.  She was started on Imdur to see if this would help with the chest pain and dyspnea.  However, she feels like Imdur causes leg cramps and does not think that she can take it.  So ranolazine 500 mg bid started and titrated this up to 1000 mg bid.  This helped some but not markedly.  Therefore, sent to  Dr. Irish Lack to address opening RCA CTO. He was able to do this in 5/15 with 4 overlapping Xience DES.  This led to resolution of exertional chest pain.    [name] [name] was started on Brilinta after PCI.  She became much more short of breath after starting on Brilinta. Per Dr Delano Metz stopped and replaced with Plavix.  Dyspnea improved off Brilinta. Given increasing exertional dyspnea and chest pain,  RHC/LHC was done in 4/17.  This showed stable CAD with no interventional target.  Left and right heart filling pressures were not significantly increased.  Medical management.   She had an MRI/MRA in May 2018 that showed moderately severe stenosis of the supraclinoid segment of the right internal carotid artery, progressed since the 2016 MR angiogram and severe basilar artery stenosis.   She developed recurrent exertional chest pain and had LHC in 6/18, showing 4/5 grafts patent with patent native RCA (similar to prior cath, no changes).   Weight is up 6 lbs.  She continues to get chest tightness with heavier exertion, this happens about twice a week (stable pattern).  Main complaint is that she is now short of breath "all the time."  She was out of breath walking into the office and walking up stairs she has to stop 1/2 way.  She has orthopnea and sleeps propped up.  Breathing is considerably worse than in the past.   Labs (6/14): K 3.8, creatinine 0.71 Labs (8/14): BNP 249, LDL 166 Labs (9/14): K 4.7, creatinine 0.7 Labs (9/14): K 4.6, creatinine 0.9, BNP 109 Labs (1/15): K 4.5, creatinine 0.73, LDL 212, HCT 43.4, TSH normal, BNP 138 Labs (6/15): K 4.7, creatinine 1.17, LDL 100, HDL 37, TGs 363, BNP 39 Labs (10/15): K 4.6, creatinine 1.2 Labs (1/16): LDL 157, HDL 43, K 5.2, creatinine 0.95 Labs (2/16): K 4.5, creatinine 1.08, BNP 113 Labs (4/16): LDL 50, HDL 56, TGs 179 Labs (9/16): K 5.3, creatinine 1.09 Labs (10/16): LDL 75, HDL 56 Labs (2/17): K 5.2, creatinine 1.07 Labs (4/17): K 5.1, creatinine 1.07 Labs (5/17): K 4.5, creatinine 1.01, LDL 96, HDL 56, BNP 70 Labs (03/2016) K 4.9, creatinine 1.07 Labs (7/18): K 4.9, creatinine 1.15  PMH: 1. Diabetic gastroparesis 2. Type II diabetes 3. HTN 4. Morbid obesity 5. CAD: s/p CABG in 12/11 after prior PCIs.  LIMA-LAD, SVG-D, seq SVG-OM1 and OM2, SVG-PDA. Adenosine Cardiolite (8/14)  with EF 53% and a small reversible apical defect with a medium, partially reversible inferior defect.  LHC (9/14) with patent SVG-D, LIMA-LAD (50% distal LAD), and sequential SVG-OM branches; the SVG-RCA and the native RCA were occluded.  This was managed medically initially, but with ongoing exertional chest pain, it was decided to open CTO.  Patient had CTO opening with 4 overlapping Xience DES in the RCA in 5/15.   - LHC (4/17): SVG-D patent, LIMA-LAD patent, distal LAD with several 40-50% stenoses, sequential SVG-OM1 and PLOM patent with 50% proximal stenosis (not flow limiting), SVG-RCA TO with  patent RCA stents.  - LHC (6/18): 4/5 grafts patent (SVG-RCA TO, same as past), RCA stents patent => no change.  6. Atypical migraines 7. OSA: Intolerant of CPAP.  8. GERD with hiatal hernia 9. OA 10. Diastolic CHF: Echo (86/76) with EF 50-55%, grade II diastolic dysfunction, mild-moderate MR.  Echo (8/14) with EF 55-60%, grade II diastolic dysfunction, mildly increased aortic valve gradient (mean 12 mmHg) but valve opens well, mild MR and mild RV dilation.  Echo (9/15) with EF 60-65%, grade II diastolic dysfunction, mild aortic stenosis, mild mitral stenosis, mild to moderate mitral regurgitation, mildly dilated RV with normal systolic function, PA systolic pressure 46 mmHg.  - RHC (4/17): mean RA 9, PA 38/15 mean 27, mean PCWP 9, CI 2.5.  - Echo (5/17): EF 55-60%, mild LVH, mildly dilated RV with low normal systolic function, moderate TR, PASP 61 mmHg 11. CKD 12. Chronic LBBB 13. Anxiety 14. Carotid stenosis: Followed by [facility], >72% LICA stenosis 09/47.  She had left carotid stent in 2/15. Carotid dopplers 8/15 with no significant disease. Carotid dopplers (4/16): < 40% RICA stenosis.  15. Cerebrovascular disease: CVA 2/14 with right posterior cerebral artery territory ischemic infarction. Cerebral angiogram in 6/14 showed 70% right vertebral ostial stenosis, 09-62% LICA stenosis, > 83% proximal left posterior cerebral artery stenosis, posterior communicating artery aneurysm.  Patient has had episodes of transient expressive aphasia.  Carotid dopplers (66/29) showed >47% LICA stenosis. She had a left carotid stent 2/15.  Possible CVA in 7/15. -  MRI/MRA in May 2018 that showed moderately severe stenosis of the supraclinoid segment of the right internal carotid artery, progressed since the 2016 MR angiogram and severe basilar artery stenosis.  16. Positional vertigo (suspected) 17. Palpitations: Holter (6/15) with rare PVCs/PACs.  - Event monitor (2/18): NSR, occasional PVCs 18. Dyspnea with  Brilinta. 19. Melanoma: On face, s/p excision.   20. Aortic stenosis: Mild.  21. Holter (8/16) with no significant arrhythmias.  22. Lower extremity arterial dopplers (9/16) were normal.  23. Sleep study (4/18): No significant OSA.   SH: Married, nonsmoker  FH: CAD  ROS: All systems reviewed and negative except as per HPI.   Current Outpatient Medications  Medication Sig Dispense Refill  . acetaminophen (TYLENOL) 325 MG tablet Take 650 mg by mouth every 6 (six) hours as needed for mild pain.     Marland Kitchen albuterol (PROVENTIL) (2.5 MG/3ML) 0.083% nebulizer solution Take 2.5 mg by nebulization every 6 (six) hours as needed for shortness of breath (when asthma is active).     Marland Kitchen albuterol (PROVENTIL,VENTOLIN) 90 MCG/ACT inhaler Inhale 2 puffs into the lungs every 6 (six) hours as needed for shortness of breath.     . Alirocumab (PRALUENT) 150 MG/ML SOPN Inject 1 pen into the skin every 14 (fourteen) days. 6 pen 3  . ALPRAZolam (XANAX) 0.5 MG tablet Take 0.5-1 mg by mouth 3 (three) times daily. 0.5mg   in the morning and at lunch, 1mg  at bedtime    . amLODipine (NORVASC) 10 MG tablet Take 10 mg by mouth at bedtime.     Marland Kitchen aspirin EC 81 MG tablet Take 162 mg by mouth daily.    Marland Kitchen b complex vitamins capsule Take 1 capsule by mouth daily.    Marland Kitchen buPROPion (WELLBUTRIN XL) 150 MG 24 hr tablet Take 1 tablet by mouth daily.    . Calcium Carbonate 1500 (600 CA) MG TABS Take 1,500 mg by mouth daily with breakfast.    . Cholecalciferol (VITAMIN D) 2000 UNITS tablet Take 2,000 Units by mouth daily with breakfast.     . cloNIDine (CATAPRES) 0.2 MG tablet Take 0.2 mg by mouth 2 (two) times daily.    . clopidogrel (PLAVIX) 75 MG tablet TAKE ONE (1) TABLET BY MOUTH EVERY DAY 90 tablet 3  . denosumab (PROLIA) 60 MG/ML SOLN injection Inject 60 mg into the skin every 6 (six) months. Administer in upper arm, thigh, or abdomen    . DULoxetine (CYMBALTA) 30 MG capsule Take 1 capsule (30 mg total) by mouth 2 (two) times daily.  180 capsule 2  . ezetimibe (ZETIA) 10 MG tablet TAKE ONE (1) TABLET EACH DAY 90 tablet 3  . Fluticasone-Salmeterol (ADVAIR DISKUS) 250-50 MCG/DOSE AEPB Inhale 1 puff into the lungs 2 (two) times daily.     Marland Kitchen gabapentin (NEURONTIN) 600 MG tablet Take 600-1,200 mg by mouth 3 (three) times daily. 600mg  in the morning and at noon. 1200 mg at bedtime.    Marland Kitchen HUMALOG KWIKPEN 100 UNIT/ML SOPN Inject 14-22 Units into the skin 4 (four) times daily. 10 units at every meal and 14 units at bedtime if needed    . HYDROcodone-acetaminophen (NORCO/VICODIN) 5-325 MG per tablet Take 1 tablet by mouth at bedtime as needed for moderate pain.     Marland Kitchen ipratropium-albuterol (DUONEB) 0.5-2.5 (3) MG/3ML SOLN Take 3 mLs by nebulization as needed (If severe asthma , use every 4 hours for 10 days).     Marland Kitchen LANTUS SOLOSTAR 100 UNIT/ML Solostar Pen Inject 54 Units into the skin every morning. Can increase or decrease by 4 units depending on blood sugar levels    . loperamide (IMODIUM) 2 MG capsule Take 2 mg by mouth as needed for diarrhea or loose stools.    Marland Kitchen losartan (COZAAR) 100 MG tablet Take 100 mg by mouth daily with breakfast.     . magnesium oxide (MAG-OX) 400 MG tablet Take 400 mg by mouth 2 (two) times daily.    . meclizine (ANTIVERT) 12.5 MG tablet Take 12.5 mg by mouth 3 (three) times daily as needed for dizziness.    . methocarbamol (ROBAXIN) 500 MG tablet TAKE ONE (1) TABLET THREE (3) TIMES EACH DAY 60 tablet 4  . metoprolol succinate (TOPROL-XL) 25 MG 24 hr tablet TAKE ONE TABLET TWICE DAILY 180 tablet 3  . Multiple Vitamin (MULTIVITAMIN) capsule Take 1 capsule by mouth daily.    . multivitamin-lutein (OCUVITE-LUTEIN) CAPS capsule Take 1 capsule by mouth daily.    . nitroGLYCERIN (NITROSTAT) 0.3 MG SL tablet Place 1 tablet (0.3 mg total) under the tongue every 5 (five) minutes x 3 doses as needed for chest pain. 90 tablet 1  . ondansetron (ZOFRAN) 4 MG tablet Take 1 tablet (4 mg total) by mouth 2 (two) times daily as  needed. For nausea (Patient taking differently: Take 4 mg by mouth 2 (two) times daily. For nausea) 20 tablet 0  . OXYGEN Inhale  2 mLs into the lungs.     . pantoprazole (PROTONIX) 40 MG tablet Take 1 tablet (40 mg total) by mouth 2 (two) times daily. 180 tablet 0  . RANEXA 1000 MG SR tablet TAKE ONE (1) TABLET BY MOUTH TWO (2) TIMES DAILY 180 each 2  . rosuvastatin (CRESTOR) 40 MG tablet TAKE ONE (1) TABLET EACH DAY 90 tablet 6  . torsemide (DEMADEX) 20 MG tablet 80 mg (4 tabs) in AM, then 40 mg (2 tabs) in PM 180 tablet 3  . triamterene-hydrochlorothiazide (DYAZIDE) 37.5-25 MG capsule Take 1 capsule by mouth daily.    . vitamin B-12 (CYANOCOBALAMIN) 1000 MCG tablet Take 1,000 mcg by mouth daily.    Marland Kitchen zinc gluconate 50 MG tablet Take 50 mg by mouth at bedtime.     Marland Kitchen zolpidem (AMBIEN) 10 MG tablet Take 10 mg by mouth at bedtime.      No current facility-administered medications for this encounter.     BP (!) 179/48   Pulse 70   Wt (!) 308 lb 12.8 oz (140.1 kg)   SpO2 91%   BMI 56.03 kg/m  PHYSICAL EXAM General: NAD, obese Neck: JVP 8-9 cm, no thyromegaly or thyroid nodule.  Lungs: Clear to auscultation bilaterally with normal respiratory effort. CV: Nondisplaced PMI.  Heart regular S1/S2, no S3/S4, no murmur.  2+ edema 1/2 to knees bilaterally.  No carotid bruit.  Normal pedal pulses.  Abdomen: Soft, nontender, no hepatosplenomegaly, no distention.  Skin: Intact without lesions or rashes.  Neurologic: Alert and oriented x 3.  Psych: Normal affect. Extremities: No clubbing or cyanosis.  HEENT: Normal.   Assessment/Plan: 1. CAD: Occluded native RCA and SVG-RCA.  Now status post opening of CTO RCA with 4 overlapping Xience DES in 5/15.  Most recent cath in 6/18 showed patent RCA stents, no interventional target.  She continues to have occasional anginal-type chest pain in stable pattern.  - Continue ASA, statin, losartan, Crestor, amlodipine, Toprol XL, and ranolazine.  - She [name]  continue Plavix long-term.   - She has not tolerated Imdur in the past. 2. Chronic diastolic CHF:  She is volume overloaded on exam today, NYHA class III symptoms.  - She has not had a recent echo, [name] set up to make sure EF remains normal.  - Increase torsemide to 80 qam/40 qpm.  BMET today and in 10 days.  3. Hyperlipidemia: She remains on Zetia, Crestor and Praluent.  Check lipids today.  4. Hypertension: BP high today but in 683M systolic when she checks at home.  I [name] not increase BP meds given cerebrovascular disease (do not want BP to fall low).    5. Cerebrovascular disease: She has history of CVA and had left carotid stent in 2/15. Followed by [facility].  Most recent MRA head showed severe stenosis of supraclinoid RICA and severe basilar artery stenosis.   - I would like her to followup with [facility], ?carotid stenting as option for RICA.  6. Palpitations: Improved on higher Toprol XL.  Event monitor in 2/18 with few PVCs.  7. Suspected OHS: Check oxygen level today with exertion, [name] order home oxygen if drops.  Uses oxygen at night already.   Loralie Champagne 12/17/2016

## 2016-12-20 ENCOUNTER — Ambulatory Visit: Payer: Medicare Other | Admitting: Pulmonary Disease

## 2016-12-28 ENCOUNTER — Other Ambulatory Visit (HOSPITAL_COMMUNITY): Payer: Medicare Other

## 2016-12-29 ENCOUNTER — Encounter (HOSPITAL_COMMUNITY): Payer: Self-pay

## 2017-01-04 ENCOUNTER — Other Ambulatory Visit (HOSPITAL_COMMUNITY): Payer: Medicare Other

## 2017-01-05 ENCOUNTER — Encounter: Payer: Self-pay | Admitting: Pulmonary Disease

## 2017-01-05 ENCOUNTER — Ambulatory Visit (INDEPENDENT_AMBULATORY_CARE_PROVIDER_SITE_OTHER): Payer: Medicare Other | Admitting: Pulmonary Disease

## 2017-01-05 DIAGNOSIS — I208 Other forms of angina pectoris: Secondary | ICD-10-CM | POA: Diagnosis not present

## 2017-01-05 DIAGNOSIS — J4530 Mild persistent asthma, uncomplicated: Secondary | ICD-10-CM | POA: Diagnosis not present

## 2017-01-05 DIAGNOSIS — G4734 Idiopathic sleep related nonobstructive alveolar hypoventilation: Secondary | ICD-10-CM

## 2017-01-05 NOTE — Progress Notes (Signed)
   Subjective:    Patient ID: [name] [name], female    DOB: 12/13/1945, 71 y.o.   [name]: [id number]  HPI  71 y o obese never smoker referred for FU of nocturnal hypoxia. She has ischemic cardiomyopathy and chronic diastolic heart failure  Sleep study  in 2006 suggested moderate OSA >>could not tolerate CPAP mask,  She underwent another PSG 05/2016 which surprisingly did not show OSA.  She slept supine and REM sleep was minimal. She did have severe desaturations to 81% and was placed on 2 L of oxygen for the remainder of the study.  Asthma dx in 20s, controlled on Advair  She had a CVA in 03/2012 with visual loss and had a left carotid stent placed in 2015 (deveshwar)  Breathing has been stable, she required oxygen during cardiac rehab due to desaturations.  During her last visit at the CHF clinic she was found to desaturate on a 6-minute walk to 88% apparently-I do not see a record of this but POC has been ordered by them. She is compliant with using oxygen during sleep. Denies wheezing or nocturnal symptoms, heart rate has been apparently acts up during the winter. She received a flu shot in November  Weight gain of 3 pounds from last 6 months   Significant tests/ events NPSG 2006: AHI 19/hr.  PSG 05/28/16 no OSA   Echo 06/2015- EF 55-60%, RVSP 61 mm Right heart cath 05/2015 PCWP 9  Spirometry 06/2016 moderate restriction, ratio 83, FEV1 53%, FVC 49%  Review of Systems neg for any significant sore throat, dysphagia, itching, sneezing, nasal congestion or excess/ purulent secretions, fever, chills, sweats, unintended wt loss, pleuritic or exertional cp, hempoptysis, orthopnea pnd or change in chronic leg swelling. Also denies presyncope, palpitations, heartburn, abdominal pain, nausea, vomiting, diarrhea or change in bowel or urinary habits, dysuria,hematuria, rash, arthralgias, visual complaints, headache, numbness weakness or ataxia.     Objective:   Physical Exam  Gen.  Pleasant, obese, in no distress ENT - class 2 airway, no post nasal drip Neck: No JVD, no thyromegaly, no carotid bruits Lungs: no use of accessory muscles, no dullness to percussion, decreased without rales or rhonchi  Cardiovascular: Rhythm regular, heart sounds  normal, no murmurs or gallops, 1+ peripheral edema Musculoskeletal: No deformities, no cyanosis or clubbing , no tremors        Assessment & Plan:

## 2017-01-05 NOTE — Assessment & Plan Note (Signed)
Continue oxygen during sleep Portable concentrator has been ordered by cardiology based on 6-minute walk test in our office where she apparently desaturated to 88%

## 2017-01-05 NOTE — Assessment & Plan Note (Signed)
Stay on Advair. Albuterol as needed.  Has not needed nebs in a year

## 2017-01-05 NOTE — Patient Instructions (Signed)
Stay on Advair. Albuterol as needed.  Continue oxygen during sleep  Keep Fluid in check as directed by cardiology

## 2017-01-12 ENCOUNTER — Telehealth: Payer: Self-pay | Admitting: Pharmacist

## 2017-01-12 NOTE — Telephone Encounter (Signed)
Spoke with pt regarding Praluent - she is tolerating the injections well and her LDL looks excellent and is at goal < 70. Will mail out PASS application so that pt can continue to receive financial support for Praluent 150mg /mL in 2019 year. She will drop off her paperwork in clinic once completed.

## 2017-01-18 ENCOUNTER — Other Ambulatory Visit (HOSPITAL_COMMUNITY): Payer: Medicare Other

## 2017-01-18 ENCOUNTER — Encounter (HOSPITAL_COMMUNITY): Payer: Medicare Other | Admitting: Cardiology

## 2017-02-15 ENCOUNTER — Other Ambulatory Visit: Payer: Self-pay

## 2017-02-15 ENCOUNTER — Encounter (HOSPITAL_COMMUNITY): Payer: Self-pay | Admitting: Cardiology

## 2017-02-15 ENCOUNTER — Ambulatory Visit (HOSPITAL_BASED_OUTPATIENT_CLINIC_OR_DEPARTMENT_OTHER)
Admission: RE | Admit: 2017-02-15 | Discharge: 2017-02-15 | Disposition: A | Discharge status: Home / Self Care | Payer: Medicare Other | Source: Ambulatory Visit | Attending: Cardiology | Admitting: Cardiology

## 2017-02-15 ENCOUNTER — Ambulatory Visit (HOSPITAL_COMMUNITY)
Admission: RE | Admit: 2017-02-15 | Discharge: 2017-02-15 | Disposition: A | Discharge status: Home / Self Care | Payer: Medicare Other | Source: Ambulatory Visit | Attending: Family Medicine | Admitting: Family Medicine

## 2017-02-15 VITALS — BP 140/65 | HR 62 | Wt 292.2 lb

## 2017-02-15 DIAGNOSIS — R002 Palpitations: Secondary | ICD-10-CM | POA: Insufficient documentation

## 2017-02-15 DIAGNOSIS — I5032 Chronic diastolic (congestive) heart failure: Secondary | ICD-10-CM

## 2017-02-15 DIAGNOSIS — Z794 Long term (current) use of insulin: Secondary | ICD-10-CM | POA: Diagnosis not present

## 2017-02-15 DIAGNOSIS — N189 Chronic kidney disease, unspecified: Secondary | ICD-10-CM | POA: Diagnosis not present

## 2017-02-15 DIAGNOSIS — Z6841 Body Mass Index (BMI) 40.0 and over, adult: Secondary | ICD-10-CM | POA: Diagnosis not present

## 2017-02-15 DIAGNOSIS — I739 Peripheral vascular disease, unspecified: Secondary | ICD-10-CM

## 2017-02-15 DIAGNOSIS — I350 Nonrheumatic aortic (valve) stenosis: Secondary | ICD-10-CM | POA: Insufficient documentation

## 2017-02-15 DIAGNOSIS — E785 Hyperlipidemia, unspecified: Secondary | ICD-10-CM | POA: Insufficient documentation

## 2017-02-15 DIAGNOSIS — Z8673 Personal history of transient ischemic attack (TIA), and cerebral infarction without residual deficits: Secondary | ICD-10-CM | POA: Diagnosis not present

## 2017-02-15 DIAGNOSIS — I2510 Atherosclerotic heart disease of native coronary artery without angina pectoris: Secondary | ICD-10-CM | POA: Insufficient documentation

## 2017-02-15 DIAGNOSIS — I130 Hypertensive heart and chronic kidney disease with heart failure and stage 1 through stage 4 chronic kidney disease, or unspecified chronic kidney disease: Secondary | ICD-10-CM | POA: Diagnosis present

## 2017-02-15 DIAGNOSIS — Z79899 Other long term (current) drug therapy: Secondary | ICD-10-CM | POA: Diagnosis not present

## 2017-02-15 DIAGNOSIS — E1122 Type 2 diabetes mellitus with diabetic chronic kidney disease: Secondary | ICD-10-CM | POA: Diagnosis not present

## 2017-02-15 LAB — ECHOCARDIOGRAM COMPLETE
AO mean calculated velocity dopler: 144 cm/s
AV Area VTI index: 0.97 cm2/m2
AV Area VTI: 2.02 cm2
AV Area mean vel: 1.84 cm2
AV Mean grad: 10 mmHg
AV Peak grad: 16 mmHg
AV VEL mean LVOT/AV: 0.65
AV area mean vel ind: 0.81 cm2/m2
AV peak Index: 0.89
AV pk vel: 198 cm/s
AV vel: 2.22
Ao pk vel: 0.71 m/s
Area-P 1/2: 2.62 cm2
E decel time: 285 msec
E/e' ratio: 20.06
FS: 29 % (ref 28–44)
IVS/LV PW RATIO, ED: 1.17
LA ID, A-P, ES: 43 mm
LA diam end sys: 43 mm
LA diam index: 1.89 cm/m2
LA vol A4C: 86.2 ml
LA vol index: 40.4 mL/m2
LA vol: 92 mL
LV E/e' medial: 20.06
LV E/e'average: 20.06
LV PW d: 12 mm — AB (ref 0.6–1.1)
LV e' LATERAL: 7.18 cm/s
LVOT SV: 101 mL
LVOT VTI: 35.7 cm
LVOT area: 2.84 cm2
LVOT diameter: 19 mm
LVOT peak VTI: 0.78 cm
LVOT peak grad rest: 8 mmHg
LVOT peak vel: 141 cm/s
Lateral S' vel: 11.2 cm/s
MV Dec: 285
MV Peak grad: 8 mmHg
MV pk A vel: 112 m/s
MV pk E vel: 144 m/s
P 1/2 time: 84 ms
RV sys press: 50 mmHg
Reg peak vel: 341 cm/s
TAPSE: 23.4 mm
TDI e' lateral: 7.18
TDI e' medial: 4.68
TR max vel: 341 cm/s
VTI: 45.6 cm
Valve area index: 0.97
Valve area: 2.22 cm2
Weight: 4676 oz

## 2017-02-15 LAB — BASIC METABOLIC PANEL
Anion gap: 11 (ref 5–15)
BUN: 18 mg/dL (ref 6–20)
CO2: 25 mmol/L (ref 22–32)
Calcium: 9.3 mg/dL (ref 8.9–10.3)
Chloride: 101 mmol/L (ref 101–111)
Creatinine, Ser: 1.08 mg/dL — ABNORMAL HIGH (ref 0.44–1.00)
GFR calc Af Amer: 59 mL/min — ABNORMAL LOW (ref >60)
GFR calc non Af Amer: 51 mL/min — ABNORMAL LOW (ref >60)
Glucose, Bld: 189 mg/dL — ABNORMAL HIGH (ref 65–99)
Potassium: 4.4 mmol/L (ref 3.5–5.1)
Sodium: 137 mmol/L (ref 135–145)

## 2017-02-15 MED ORDER — TORSEMIDE 20 MG PO TABS: ORAL_TABLET | ORAL | 3 refills | Status: DC

## 2017-02-15 NOTE — Progress Notes (Signed)
  Echocardiogram 2D Echocardiogram has been performed.  Arraya Buck T Adilen Pavelko 02/15/2017, 12:16 PM

## 2017-02-15 NOTE — Patient Instructions (Signed)
Increase Torsemide 80 mg (4 tabs) in AM, 40 mg (2 tabs) in PM  Labs drawn today (if we do not call you, then your lab work was stable)   Your physician recommends that you return for lab work in: 10 days  Your physician recommends that you schedule a follow-up appointment in: 3 months with Dr. [name]  (we will call you)

## 2017-02-16 NOTE — Progress Notes (Signed)
Patient ID: [name] [name], female   DOB: 01-24-1946, 72 y.o.   [name]: [id number] PCP: Dr. Sandi Mariscal Cardiology: Dr. [name]  72 y.o. with history of CAD s/p CABG, diastolic CHF, and cerebrovascular disease with history of CVA presents for followup of CHF and diastolic CHF. She had CABG x 5 in 12/11.  Prior to the CABG she had multiple PCIs.  She had a CVA in 2/14 that presented as visual loss.  She had a left carotid stent in 2/15.   Left heart cath done in Sept. 2014. This showed patent SVG-D, LIMA-LAD, and sequential SVG-OM branches but SVG-RCA and the native RCA were both totally occluded. It was felt aht her increased symptoms coincided with occlusion of SVG-RCA.  She was started on Imdur to see if this would help with the chest pain and dyspnea.  However, she feels like Imdur causes leg cramps and does not think that she can take it.  So ranolazine 500 mg bid started and titrated this up to 1000 mg bid.  This helped some but not markedly.  Therefore, sent to  Dr. Irish Lack to address opening RCA CTO. He was able to do this in 5/15 with 4 overlapping Xience DES.  This led to resolution of exertional chest pain.    [name] was started on Brilinta after PCI.  She became much more short of breath after starting on Brilinta. Per Dr Delano Metz stopped and replaced with Plavix.  Dyspnea improved off Brilinta. Given increasing exertional dyspnea and chest pain,  RHC/LHC was done in 4/17.  This showed stable CAD with no interventional target.  Left and right heart filling pressures were not significantly increased.  Medical management.   She had an MRI/MRA in May 2018 that showed moderately severe stenosis of the supraclinoid segment of the right internal carotid artery, progressed since the 2016 MR angiogram and severe basilar artery stenosis.   She developed recurrent exertional chest pain and had LHC in 6/18, showing 4/5 grafts patent with patent native RCA (similar to prior cath, no changes).     She returns for followup of CHF and CAD today.  Weight is down 16 lbs, she has been dieting.  Still short of breath after walking about 100 feet, no change.  Rare chest pain, no NTG use.  Very short of breath with stairs.  She has orthopnea and sleeps propped up.  At last appointment, I had wanted her to increase torsemide but she left it at the same dose.   Echo was done today and reviewed: EF 55-60%, moderate diastolic dysfunction, PASP 50 mmHg, mildly dilated RV, mild AS, mild-moderate MR.   Labs (6/14): K 3.8, creatinine 0.71 Labs (8/14): BNP 249, LDL 166 Labs (9/14): K 4.7, creatinine 0.7 Labs (9/14): K 4.6, creatinine 0.9, BNP 109 Labs (1/15): K 4.5, creatinine 0.73, LDL 212, HCT 43.4, TSH normal, BNP 138 Labs (6/15): K 4.7, creatinine 1.17, LDL 100, HDL 37, TGs 363, BNP 39 Labs (10/15): K 4.6, creatinine 1.2 Labs (1/16): LDL 157, HDL 43, K 5.2, creatinine 0.95 Labs (2/16): K 4.5, creatinine 1.08, BNP 113 Labs (4/16): LDL 50, HDL 56, TGs 179 Labs (9/16): K 5.3, creatinine 1.09 Labs (10/16): LDL 75, HDL 56 Labs (2/17): K 5.2, creatinine 1.07 Labs (4/17): K 5.1, creatinine 1.07 Labs (5/17): K 4.5, creatinine 1.01, LDL 96, HDL 56, BNP 70 Labs (03/2016) K 4.9, creatinine 1.07 Labs (7/18): K 4.9, creatinine 1.15 Labs (11/18): K 5.1, creatinine 1.11, LDL 36  PMH: 1.  Diabetic gastroparesis 2. Type II diabetes 3. HTN 4. Morbid obesity 5. CAD: s/p CABG in 12/11 after prior PCIs.  LIMA-LAD, SVG-D, seq SVG-OM1 and OM2, SVG-PDA. Adenosine Cardiolite (8/14) with EF 53% and a small reversible apical defect with a medium, partially reversible inferior defect.  LHC (9/14) with patent SVG-D, LIMA-LAD (50% distal LAD), and sequential SVG-OM branches; the SVG-RCA and the native RCA were occluded.  This was managed medically initially, but with ongoing exertional chest pain, it was decided to open CTO.  Patient had CTO opening with 4 overlapping Xience DES in the RCA in 5/15.   - LHC (4/17): SVG-D  patent, LIMA-LAD patent, distal LAD with several 40-50% stenoses, sequential SVG-OM1 and PLOM patent with 50% proximal stenosis (not flow limiting), SVG-RCA TO with patent RCA stents.  - LHC (6/18): 4/5 grafts patent (SVG-RCA TO, same as past), RCA stents patent => no change.  6. Atypical migraines 7. OHS/OSA: Intolerant of CPAP. Uses oxygen with exertion and at night.  8. GERD with hiatal hernia 9. OA 10. Diastolic CHF: Echo (25/85) with EF 50-55%, grade II diastolic dysfunction, mild-moderate MR.  Echo (8/14) with EF 55-60%, grade II diastolic dysfunction, mildly increased aortic valve gradient (mean 12 mmHg) but valve opens well, mild MR and mild RV dilation.  Echo (9/15) with EF 60-65%, grade II diastolic dysfunction, mild aortic stenosis, mild mitral stenosis, mild to moderate mitral regurgitation, mildly dilated RV with normal systolic function, PA systolic pressure 46 mmHg.  - RHC (4/17): mean RA 9, PA 38/15 mean 27, mean PCWP 9, CI 2.5.  - Echo (5/17): EF 55-60%, mild LVH, mildly dilated RV with low normal systolic function, moderate TR, PASP 61 mmHg - Echo (1/19): EF 55-60%, moderate diastolic dysfunction, PASP 50 mmHg, mildly dilated RV, mild AS, mild-moderate MR. 11. CKD 12. Chronic LBBB 13. Anxiety 14. Carotid stenosis: Followed by [facility], >27% LICA stenosis 78/24.  She had left carotid stent in 2/15. Carotid dopplers 8/15 with no significant disease. Carotid dopplers (4/16): < 40% RICA stenosis.  15. Cerebrovascular disease: CVA 2/14 with right posterior cerebral artery territory ischemic infarction. Cerebral angiogram in 6/14 showed 70% right vertebral ostial stenosis, 23-53% LICA stenosis, > 61% proximal left posterior cerebral artery stenosis, posterior communicating artery aneurysm.  Patient has had episodes of transient expressive aphasia.  Carotid dopplers (44/31) showed >54% LICA stenosis. She had a left carotid stent 2/15.  Possible CVA in 7/15. -  MRI/MRA in May 2018 that showed  moderately severe stenosis of the supraclinoid segment of the right internal carotid artery, progressed since the 2016 MR angiogram and severe basilar artery stenosis.  16. Positional vertigo (suspected) 17. Palpitations: Holter (6/15) with rare PVCs/PACs.  - Event monitor (2/18): NSR, occasional PVCs 18. Dyspnea with Brilinta. 19. Melanoma: On face, s/p excision.   20. Aortic stenosis: Mild.  21. Holter (8/16) with no significant arrhythmias.  22. Lower extremity arterial dopplers (9/16) were normal.  23. Sleep study (4/18): No significant OSA.   SH: Married, nonsmoker  FH: CAD  ROS: All systems reviewed and negative except as per HPI.   Current Outpatient Medications  Medication Sig Dispense Refill  . acetaminophen (TYLENOL) 325 MG tablet Take 650 mg by mouth every 6 (six) hours as needed for mild pain.     Marland Kitchen albuterol (PROVENTIL) (2.5 MG/3ML) 0.083% nebulizer solution Take 2.5 mg by nebulization every 6 (six) hours as needed for shortness of breath (when asthma is active).     Marland Kitchen albuterol (PROVENTIL,VENTOLIN) 90 MCG/ACT inhaler  Inhale 2 puffs into the lungs every 6 (six) hours as needed for shortness of breath.     . Alirocumab (PRALUENT) 150 MG/ML SOPN Inject 1 pen into the skin every 14 (fourteen) days. 6 pen 3  . ALPRAZolam (XANAX) 0.5 MG tablet Take 0.5-1 mg by mouth 3 (three) times daily. 0.5mg  in the morning and at lunch, 1mg  at bedtime    . amLODipine (NORVASC) 10 MG tablet Take 10 mg by mouth at bedtime.     Marland Kitchen aspirin EC 81 MG tablet Take 162 mg by mouth daily.    Marland Kitchen b complex vitamins capsule Take 1 capsule by mouth daily.    Marland Kitchen buPROPion (WELLBUTRIN XL) 150 MG 24 hr tablet Take 1 tablet by mouth daily.    . Calcium Carbonate 1500 (600 CA) MG TABS Take 1,500 mg by mouth daily with breakfast.    . Cholecalciferol (VITAMIN D) 2000 UNITS tablet Take 2,000 Units by mouth daily with breakfast.     . cloNIDine (CATAPRES) 0.2 MG tablet Take 0.2 mg by mouth 2 (two) times daily.    .  clopidogrel (PLAVIX) 75 MG tablet TAKE ONE (1) TABLET BY MOUTH EVERY DAY 90 tablet 3  . denosumab (PROLIA) 60 MG/ML SOLN injection Inject 60 mg into the skin every 6 (six) months. Administer in upper arm, thigh, or abdomen    . DULoxetine (CYMBALTA) 30 MG capsule Take 1 capsule (30 mg total) by mouth 2 (two) times daily. 180 capsule 2  . ezetimibe (ZETIA) 10 MG tablet TAKE ONE (1) TABLET EACH DAY 90 tablet 3  . Fluticasone-Salmeterol (ADVAIR DISKUS) 250-50 MCG/DOSE AEPB Inhale 1 puff into the lungs 2 (two) times daily.     Marland Kitchen gabapentin (NEURONTIN) 600 MG tablet Take 600-1,200 mg by mouth 3 (three) times daily. 600mg  in the morning and at noon. 1200 mg at bedtime.    Marland Kitchen HUMALOG KWIKPEN 100 UNIT/ML SOPN Inject 14-22 Units into the skin 4 (four) times daily. 10 units at every meal and 14 units at bedtime if needed    . HYDROcodone-acetaminophen (NORCO/VICODIN) 5-325 MG per tablet Take 1 tablet by mouth at bedtime as needed for moderate pain.     Marland Kitchen ipratropium-albuterol (DUONEB) 0.5-2.5 (3) MG/3ML SOLN Take 3 mLs by nebulization as needed (If severe asthma , use every 4 hours for 10 days).     Marland Kitchen LANTUS SOLOSTAR 100 UNIT/ML Solostar Pen Inject 54 Units into the skin every morning. Can increase or decrease by 4 units depending on blood sugar levels    . loperamide (IMODIUM) 2 MG capsule Take 2 mg by mouth as needed for diarrhea or loose stools.    Marland Kitchen losartan (COZAAR) 100 MG tablet Take 100 mg by mouth daily with breakfast.     . magnesium oxide (MAG-OX) 400 MG tablet Take 400 mg by mouth 2 (two) times daily.    . meclizine (ANTIVERT) 12.5 MG tablet Take 12.5 mg by mouth 3 (three) times daily as needed for dizziness.    . methocarbamol (ROBAXIN) 500 MG tablet TAKE ONE (1) TABLET THREE (3) TIMES EACH DAY 60 tablet 4  . metoprolol succinate (TOPROL-XL) 25 MG 24 hr tablet TAKE ONE TABLET TWICE DAILY 180 tablet 3  . Multiple Vitamin (MULTIVITAMIN) capsule Take 1 capsule by mouth daily.    . multivitamin-lutein  (OCUVITE-LUTEIN) CAPS capsule Take 1 capsule by mouth daily.    . nitroGLYCERIN (NITROSTAT) 0.3 MG SL tablet Place 1 tablet (0.3 mg total) under the tongue every 5 (five)  minutes x 3 doses as needed for chest pain. 90 tablet 1  . ondansetron (ZOFRAN) 4 MG tablet Take 1 tablet (4 mg total) by mouth 2 (two) times daily as needed. For nausea (Patient taking differently: Take 4 mg by mouth 2 (two) times daily. For nausea) 20 tablet 0  . OXYGEN Inhale 2 mLs into the lungs.     . pantoprazole (PROTONIX) 40 MG tablet Take 1 tablet (40 mg total) by mouth 2 (two) times daily. 180 tablet 0  . RANEXA 1000 MG SR tablet TAKE ONE (1) TABLET BY MOUTH TWO (2) TIMES DAILY 180 each 2  . rosuvastatin (CRESTOR) 40 MG tablet TAKE ONE (1) TABLET EACH DAY 90 tablet 6  . torsemide (DEMADEX) 20 MG tablet 80 mg (4 tabs) in AM, then 40 mg (2 tabs) in PM 180 tablet 3  . triamterene-hydrochlorothiazide (DYAZIDE) 37.5-25 MG capsule Take 1 capsule by mouth daily.    . vitamin B-12 (CYANOCOBALAMIN) 1000 MCG tablet Take 1,000 mcg by mouth daily.    Marland Kitchen zinc gluconate 50 MG tablet Take 50 mg by mouth at bedtime.     Marland Kitchen zolpidem (AMBIEN) 10 MG tablet Take 10 mg by mouth at bedtime.      No current facility-administered medications for this encounter.     BP 140/65   Pulse 62   Wt 292 lb 4 oz (132.6 kg)   SpO2 96%   BMI 53.02 kg/m  PHYSICAL EXAM General: NAD, obese Neck: JVP 8-9 cm, no thyromegaly or thyroid nodule.  Lungs: Clear to auscultation bilaterally with normal respiratory effort. CV: Nondisplaced PMI.  Heart regular S1/S2, no S3/S4, 2/6 early SEM RUSB.  1+ edema 1/2 to knees bilaterally.  No carotid bruit.  Normal pedal pulses.  Abdomen: Soft, nontender, no hepatosplenomegaly, no distention.  Skin: Intact without lesions or rashes.  Neurologic: Alert and oriented x 3.  Psych: Normal affect. Extremities: No clubbing or cyanosis.  HEENT: Normal.   Assessment/Plan: 1. CAD: Occluded native RCA and SVG-RCA.  Now  status post opening of CTO RCA with 4 overlapping Xience DES in 5/15.  Most recent cath in 6/18 showed patent RCA stents, no interventional target.  Rare chest pain, no NTG use.  - Continue ASA, statin, losartan, Crestor, amlodipine, Toprol XL, and ranolazine.  - She will continue Plavix long-term.   - She has not tolerated Imdur in the past. 2. Chronic diastolic CHF:  She is volume overloaded on exam today, NYHA class IIIb symptoms. Echo in 1/19 with EF 55-60%, moderate diastolic dysfunction.  - Increase torsemide to 80 qam/40.  BMET today and in 10 days.  3. Hyperlipidemia: She remains on Zetia, Crestor and Praluent.  Good lipids in 11/18.   4. Hypertension: BP mildly elevated today but in 009Q systolic when she checks at home.  I will not increase BP meds given cerebrovascular disease (do not want BP to fall low).    5. Cerebrovascular disease: She has history of CVA and had left carotid stent in 2/15. Followed by [facility].  Most recent MRA head showed severe stenosis of supraclinoid RICA and severe basilar artery stenosis.   - I would like her to followup with [facility], ?carotid stenting as option for RICA.  6. Palpitations: Improved on higher Toprol XL.  Event monitor in 2/18 with few PVCs.  7. OHS/OSA: Cannot tolerate CPAP.  Uses oxygen with exertion and at night.   Loralie Champagne 02/16/2017

## 2017-02-17 ENCOUNTER — Telehealth: Payer: Self-pay | Admitting: Pharmacist

## 2017-02-17 NOTE — Telephone Encounter (Signed)
LMOM for pt - mailed out P[employer] application for Praluent financial assistance on 01/12/17 but have not heard back from pt yet.

## 2017-02-28 ENCOUNTER — Other Ambulatory Visit (HOSPITAL_COMMUNITY): Payer: Medicare Other

## 2017-03-01 ENCOUNTER — Telehealth: Payer: Self-pay | Admitting: Neurology

## 2017-03-01 ENCOUNTER — Other Ambulatory Visit (HOSPITAL_COMMUNITY): Payer: Medicare Other

## 2017-03-01 ENCOUNTER — Ambulatory Visit: Payer: Medicare Other | Admitting: Neurology

## 2017-03-01 NOTE — Telephone Encounter (Signed)
This patient canceled the same day of her appointment.

## 2017-03-02 ENCOUNTER — Encounter: Payer: Self-pay | Admitting: Neurology

## 2017-03-03 ENCOUNTER — Ambulatory Visit (HOSPITAL_COMMUNITY)
Admission: RE | Admit: 2017-03-03 | Discharge: 2017-03-03 | Disposition: A | Discharge status: Home / Self Care | Payer: Medicare Other | Source: Ambulatory Visit | Attending: Cardiology | Admitting: Cardiology

## 2017-03-03 DIAGNOSIS — I5032 Chronic diastolic (congestive) heart failure: Secondary | ICD-10-CM | POA: Diagnosis not present

## 2017-03-03 LAB — BASIC METABOLIC PANEL
Anion gap: 12 (ref 5–15)
BUN: 19 mg/dL (ref 6–20)
CO2: 25 mmol/L (ref 22–32)
Calcium: 9.5 mg/dL (ref 8.9–10.3)
Chloride: 103 mmol/L (ref 101–111)
Creatinine, Ser: 1.22 mg/dL — ABNORMAL HIGH (ref 0.44–1.00)
GFR calc Af Amer: 51 mL/min — ABNORMAL LOW (ref >60)
GFR calc non Af Amer: 44 mL/min — ABNORMAL LOW (ref >60)
Glucose, Bld: 109 mg/dL — ABNORMAL HIGH (ref 65–99)
Potassium: 4.9 mmol/L (ref 3.5–5.1)
Sodium: 140 mmol/L (ref 135–145)

## 2017-04-01 NOTE — Telephone Encounter (Signed)
LMOM again for pt regarding Praluent patient assistance paperwork that was mailed out 3 months ago.

## 2017-04-18 ENCOUNTER — Ambulatory Visit: Payer: Medicare Other | Admitting: Podiatry

## 2017-04-25 ENCOUNTER — Ambulatory Visit (INDEPENDENT_AMBULATORY_CARE_PROVIDER_SITE_OTHER): Payer: Medicare Other

## 2017-04-25 ENCOUNTER — Ambulatory Visit (INDEPENDENT_AMBULATORY_CARE_PROVIDER_SITE_OTHER): Payer: Medicare Other | Admitting: Podiatry

## 2017-04-25 ENCOUNTER — Encounter: Payer: Self-pay | Admitting: Podiatry

## 2017-04-25 VITALS — BP 140/62 | HR 59 | Resp 16

## 2017-04-25 DIAGNOSIS — M2011 Hallux valgus (acquired), right foot: Secondary | ICD-10-CM

## 2017-04-25 DIAGNOSIS — G629 Polyneuropathy, unspecified: Secondary | ICD-10-CM | POA: Diagnosis not present

## 2017-04-25 DIAGNOSIS — D689 Coagulation defect, unspecified: Secondary | ICD-10-CM

## 2017-04-25 DIAGNOSIS — E1149 Type 2 diabetes mellitus with other diabetic neurological complication: Secondary | ICD-10-CM

## 2017-04-25 DIAGNOSIS — E1140 Type 2 diabetes mellitus with diabetic neuropathy, unspecified: Secondary | ICD-10-CM

## 2017-04-25 DIAGNOSIS — L84 Corns and callosities: Secondary | ICD-10-CM | POA: Diagnosis not present

## 2017-04-25 NOTE — Progress Notes (Signed)
Subjective:   Patient ID: [name] [name], female   DOB: 72 y.o.   [name]: [id number]   HPI Patient presents with significant lesions underneath the first metatarsal and hallux of both feet with structural bunion deformity right over left with poor health individual with long-term diabetes that is not been in good control and also on blood thinner.  Patient does not smoke and likes to be active   Review of Systems  All other systems reviewed and are negative.       Objective:  Physical Exam  Constitutional: She appears well-developed and well-nourished.  Cardiovascular: Intact distal pulses.  Pulmonary/Chest: Effort normal.  Musculoskeletal: Normal range of motion.  Neurological: She is alert.  Skin: Skin is warm.  Nursing note and vitals reviewed.   Vascular status found to be mildly diminished with pulses intact but diminished with good digital perfusion noted.  Neurologically diminished sharp dull vibratory was noted in the distal foot bilateral and patient does have significant structural bunion deformity right with obesity is complicating factor.  Patient also has significant keratotic lesions that are painful underneath the first metatarsal bilateral and hallux bilateral     Assessment:  At risk diabetic with keratotic lesion formation and structural deformity and on blood     Plan:  H&P conditions reviewed and debridement of lesions accomplished.  I then went ahead and I discussed diabetic shoes and will get patient approval for diabetic shoes and I explained the utilization of diabetic shoes for the patient.  Also gave education on diabetic foot care

## 2017-05-17 ENCOUNTER — Encounter (HOSPITAL_COMMUNITY): Payer: Self-pay | Admitting: Cardiology

## 2017-05-17 ENCOUNTER — Other Ambulatory Visit: Payer: Self-pay

## 2017-05-17 ENCOUNTER — Ambulatory Visit (HOSPITAL_COMMUNITY)
Admission: RE | Admit: 2017-05-17 | Discharge: 2017-05-17 | Disposition: A | Discharge status: Home / Self Care | Payer: Medicare Other | Source: Ambulatory Visit | Attending: Cardiology | Admitting: Cardiology

## 2017-05-17 VITALS — BP 142/54 | HR 52 | Wt 301.5 lb

## 2017-05-17 DIAGNOSIS — E119 Type 2 diabetes mellitus without complications: Secondary | ICD-10-CM | POA: Diagnosis not present

## 2017-05-17 DIAGNOSIS — Z794 Long term (current) use of insulin: Secondary | ICD-10-CM | POA: Diagnosis not present

## 2017-05-17 DIAGNOSIS — I679 Cerebrovascular disease, unspecified: Secondary | ICD-10-CM | POA: Insufficient documentation

## 2017-05-17 DIAGNOSIS — I739 Peripheral vascular disease, unspecified: Secondary | ICD-10-CM

## 2017-05-17 DIAGNOSIS — I5032 Chronic diastolic (congestive) heart failure: Secondary | ICD-10-CM | POA: Insufficient documentation

## 2017-05-17 DIAGNOSIS — R002 Palpitations: Secondary | ICD-10-CM | POA: Insufficient documentation

## 2017-05-17 DIAGNOSIS — I493 Ventricular premature depolarization: Secondary | ICD-10-CM | POA: Insufficient documentation

## 2017-05-17 DIAGNOSIS — I130 Hypertensive heart and chronic kidney disease with heart failure and stage 1 through stage 4 chronic kidney disease, or unspecified chronic kidney disease: Secondary | ICD-10-CM | POA: Insufficient documentation

## 2017-05-17 DIAGNOSIS — E785 Hyperlipidemia, unspecified: Secondary | ICD-10-CM | POA: Insufficient documentation

## 2017-05-17 DIAGNOSIS — I2510 Atherosclerotic heart disease of native coronary artery without angina pectoris: Secondary | ICD-10-CM | POA: Diagnosis not present

## 2017-05-17 DIAGNOSIS — Z79899 Other long term (current) drug therapy: Secondary | ICD-10-CM | POA: Diagnosis not present

## 2017-05-17 LAB — BASIC METABOLIC PANEL
Anion gap: 11 (ref 5–15)
BUN: 19 mg/dL (ref 6–20)
CO2: 27 mmol/L (ref 22–32)
Calcium: 9.4 mg/dL (ref 8.9–10.3)
Chloride: 103 mmol/L (ref 101–111)
Creatinine, Ser: 1.12 mg/dL — ABNORMAL HIGH (ref 0.44–1.00)
GFR calc Af Amer: 57 mL/min — ABNORMAL LOW (ref >60)
GFR calc non Af Amer: 49 mL/min — ABNORMAL LOW (ref >60)
Glucose, Bld: 151 mg/dL — ABNORMAL HIGH (ref 65–99)
Potassium: 4.2 mmol/L (ref 3.5–5.1)
Sodium: 141 mmol/L (ref 135–145)

## 2017-05-17 MED ORDER — TORSEMIDE 20 MG PO TABS: 80.0000 mg | ORAL_TABLET | Freq: Two times a day (BID) | ORAL | 3 refills | Status: DC

## 2017-05-17 NOTE — Patient Instructions (Signed)
Increase Torsemide 80 mg (4 tabs), twice a day  You have been referred to [facility] drawn today (if we do not call you, then your lab work was stable)   Your physician recommends that you return for lab work in: 10 days  Your physician recommends that you schedule a follow-up appointment in: 3 weeks with APP

## 2017-05-17 NOTE — Progress Notes (Signed)
Patient ID: [name] [name], female   DOB: Nov 01, 1945, 72 y.o.   [name]: [id number] PCP: Dr. Sandi Mariscal Cardiology: Dr. [name]  72 y.o. with history of CAD s/p CABG, diastolic CHF, and cerebrovascular disease with history of CVA presents for followup of CHF and diastolic CHF. She had CABG x 5 in 12/11.  Prior to the CABG she had multiple PCIs.  She had a CVA in 2/14 that presented as visual loss.  She had a left carotid stent in 2/15.   Left heart cath done in Sept. 2014. This showed patent SVG-D, LIMA-LAD, and sequential SVG-OM branches but SVG-RCA and the native RCA were both totally occluded. It was felt aht her increased symptoms coincided with occlusion of SVG-RCA.  She was started on Imdur to see if this would help with the chest pain and dyspnea.  However, she feels like Imdur causes leg cramps and does not think that she can take it.  So ranolazine 500 mg bid started and titrated this up to 1000 mg bid.  This helped some but not markedly.  Therefore, sent to  Dr. Irish Lack to address opening RCA CTO. He was able to do this in 5/15 with 4 overlapping Xience DES.  This led to resolution of exertional chest pain.    Mrs Mille was started on Brilinta after PCI.  She became much more short of breath after starting on Brilinta. Per Dr Delano Metz stopped and replaced with Plavix.  Dyspnea improved off Brilinta. Given increasing exertional dyspnea and chest pain,  RHC/LHC was done in 4/17.  This showed stable CAD with no interventional target.  Left and right heart filling pressures were not significantly increased.  Medical management.   She had an MRI/MRA in May 2018 that showed moderately severe stenosis of the supraclinoid segment of the right internal carotid artery, progressed since the 2016 MR angiogram and severe basilar artery stenosis.   She developed recurrent exertional chest pain and had LHC in 6/18, showing 4/5 grafts patent with patent native RCA (similar to prior cath, no changes).     Echo 1/19 with EF 55-60%, moderate diastolic dysfunction, PASP 50 mmHg, mildly dilated RV, mild AS, mild-moderate MR.   She returns for followup of CHF and CAD today.  Weight is up today. She is not short of breath at rest but is short of breath walking 50-100 feet. She has chronic orthopnea.  Occasional chest pain, Generally occurs when she tries to carry a load up her stairs.  She is struggling with depression, significant anhedonia. No lightheadedness or palpitations.   Labs (6/14): K 3.8, creatinine 0.71 Labs (8/14): BNP 249, LDL 166 Labs (9/14): K 4.7, creatinine 0.7 Labs (9/14): K 4.6, creatinine 0.9, BNP 109 Labs (1/15): K 4.5, creatinine 0.73, LDL 212, HCT 43.4, TSH normal, BNP 138 Labs (6/15): K 4.7, creatinine 1.17, LDL 100, HDL 37, TGs 363, BNP 39 Labs (10/15): K 4.6, creatinine 1.2 Labs (1/16): LDL 157, HDL 43, K 5.2, creatinine 0.95 Labs (2/16): K 4.5, creatinine 1.08, BNP 113 Labs (4/16): LDL 50, HDL 56, TGs 179 Labs (9/16): K 5.3, creatinine 1.09 Labs (10/16): LDL 75, HDL 56 Labs (2/17): K 5.2, creatinine 1.07 Labs (4/17): K 5.1, creatinine 1.07 Labs (5/17): K 4.5, creatinine 1.01, LDL 96, HDL 56, BNP 70 Labs (03/2016) K 4.9, creatinine 1.07 Labs (7/18): K 4.9, creatinine 1.15 Labs (11/18): K 5.1, creatinine 1.11, LDL 36 Labs (1/19): K 4.9, creatinine 1.22  PMH: 1. Diabetic gastroparesis 2. Type II diabetes 3.  HTN 4. Morbid obesity 5. CAD: s/p CABG in 12/11 after prior PCIs.  LIMA-LAD, SVG-D, seq SVG-OM1 and OM2, SVG-PDA. Adenosine Cardiolite (8/14) with EF 53% and a small reversible apical defect with a medium, partially reversible inferior defect.  LHC (9/14) with patent SVG-D, LIMA-LAD (50% distal LAD), and sequential SVG-OM branches; the SVG-RCA and the native RCA were occluded.  This was managed medically initially, but with ongoing exertional chest pain, it was decided to open CTO.  Patient had CTO opening with 4 overlapping Xience DES in the RCA in 5/15.   - LHC  (4/17): SVG-D patent, LIMA-LAD patent, distal LAD with several 40-50% stenoses, sequential SVG-OM1 and PLOM patent with 50% proximal stenosis (not flow limiting), SVG-RCA TO with patent RCA stents.  - LHC (6/18): 4/5 grafts patent (SVG-RCA TO, same as past), RCA stents patent => no change.  6. Atypical migraines 7. OHS/OSA: Intolerant of CPAP. Uses oxygen with exertion and at night.  8. GERD with hiatal hernia 9. OA 10. Diastolic CHF: Echo (21/30) with EF 50-55%, grade II diastolic dysfunction, mild-moderate MR.  Echo (8/14) with EF 55-60%, grade II diastolic dysfunction, mildly increased aortic valve gradient (mean 12 mmHg) but valve opens well, mild MR and mild RV dilation.  Echo (9/15) with EF 60-65%, grade II diastolic dysfunction, mild aortic stenosis, mild mitral stenosis, mild to moderate mitral regurgitation, mildly dilated RV with normal systolic function, PA systolic pressure 46 mmHg.  - RHC (4/17): mean RA 9, PA 38/15 mean 27, mean PCWP 9, CI 2.5.  - Echo (5/17): EF 55-60%, mild LVH, mildly dilated RV with low normal systolic function, moderate TR, PASP 61 mmHg - Echo (1/19): EF 55-60%, moderate diastolic dysfunction, PASP 50 mmHg, mildly dilated RV, mild AS, mild-moderate MR. 11. CKD 12. Chronic LBBB 13. Anxiety 14. Carotid stenosis: Followed by [facility], >86% LICA stenosis 57/84.  She had left carotid stent in 2/15. Carotid dopplers 8/15 with no significant disease. Carotid dopplers (4/16): < 40% RICA stenosis.  15. Cerebrovascular disease: CVA 2/14 with right posterior cerebral artery territory ischemic infarction. Cerebral angiogram in 6/14 showed 70% right vertebral ostial stenosis, 69-62% LICA stenosis, > 95% proximal left posterior cerebral artery stenosis, posterior communicating artery aneurysm.  Patient has had episodes of transient expressive aphasia.  Carotid dopplers (28/41) showed >32% LICA stenosis. She had a left carotid stent 2/15.  Possible CVA in 7/15. -  MRI/MRA in May 2018  that showed moderately severe stenosis of the supraclinoid segment of the right internal carotid artery, progressed since the 2016 MR angiogram and severe basilar artery stenosis.  16. Positional vertigo (suspected) 17. Palpitations: Holter (6/15) with rare PVCs/PACs.  - Event monitor (2/18): NSR, occasional PVCs 18. Dyspnea with Brilinta. 19. Melanoma: On face, s/p excision.   20. Aortic stenosis: Mild.  21. Holter (8/16) with no significant arrhythmias.  22. Lower extremity arterial dopplers (9/16) were normal.  23. Sleep study (4/18): No significant OSA.   SH: Married, nonsmoker  FH: CAD  ROS: All systems reviewed and negative except as per HPI.   Current Outpatient Medications  Medication Sig Dispense Refill  . acetaminophen (TYLENOL) 325 MG tablet Take 650 mg by mouth every 6 (six) hours as needed for mild pain.     Marland Kitchen albuterol (PROVENTIL) (2.5 MG/3ML) 0.083% nebulizer solution Take 2.5 mg by nebulization every 6 (six) hours as needed for shortness of breath (when asthma is active).     Marland Kitchen albuterol (PROVENTIL,VENTOLIN) 90 MCG/ACT inhaler Inhale 2 puffs into the lungs every  6 (six) hours as needed for shortness of breath.     . Alirocumab (PRALUENT) 150 MG/ML SOPN Inject 1 pen into the skin every 14 (fourteen) days. 6 pen 3  . ALPRAZolam (XANAX) 0.5 MG tablet Take 0.5-1 mg by mouth 3 (three) times daily. 0.5mg  in the morning and at lunch, 1mg  at bedtime    . amLODipine (NORVASC) 10 MG tablet Take 10 mg by mouth at bedtime.     Marland Kitchen aspirin EC 81 MG tablet Take 162 mg by mouth daily.    Marland Kitchen b complex vitamins capsule Take 1 capsule by mouth daily.    Marland Kitchen buPROPion (WELLBUTRIN XL) 150 MG 24 hr tablet Take 1 tablet by mouth daily.    . Calcium Carbonate 1500 (600 CA) MG TABS Take 1,500 mg by mouth daily with breakfast.    . Cholecalciferol (VITAMIN D) 2000 UNITS tablet Take 2,000 Units by mouth daily with breakfast.     . cloNIDine (CATAPRES) 0.2 MG tablet Take 0.2 mg by mouth 2 (two) times  daily.    . clopidogrel (PLAVIX) 75 MG tablet TAKE ONE (1) TABLET BY MOUTH EVERY DAY 90 tablet 3  . denosumab (PROLIA) 60 MG/ML SOLN injection Inject 60 mg into the skin every 6 (six) months. Administer in upper arm, thigh, or abdomen    . DULoxetine (CYMBALTA) 30 MG capsule Take 1 capsule (30 mg total) by mouth 2 (two) times daily. 180 capsule 2  . ezetimibe (ZETIA) 10 MG tablet TAKE ONE (1) TABLET EACH DAY 90 tablet 3  . Fluticasone-Salmeterol (ADVAIR DISKUS) 250-50 MCG/DOSE AEPB Inhale 1 puff into the lungs 2 (two) times daily.     Marland Kitchen gabapentin (NEURONTIN) 600 MG tablet Take 600-1,200 mg by mouth 3 (three) times daily. 600mg  in the morning and at noon. 1200 mg at bedtime.    Marland Kitchen HUMALOG KWIKPEN 100 UNIT/ML SOPN Inject 14-22 Units into the skin 4 (four) times daily. 10 units at every meal and 14 units at bedtime if needed    . HYDROcodone-acetaminophen (NORCO/VICODIN) 5-325 MG per tablet Take 1 tablet by mouth at bedtime as needed for moderate pain.     Marland Kitchen ipratropium-albuterol (DUONEB) 0.5-2.5 (3) MG/3ML SOLN Take 3 mLs by nebulization as needed (If severe asthma , use every 4 hours for 10 days).     Marland Kitchen LANTUS SOLOSTAR 100 UNIT/ML Solostar Pen Inject 54 Units into the skin every morning. Can increase or decrease by 4 units depending on blood sugar levels    . loperamide (IMODIUM) 2 MG capsule Take 2 mg by mouth as needed for diarrhea or loose stools.    Marland Kitchen losartan (COZAAR) 100 MG tablet Take 100 mg by mouth daily with breakfast.     . magnesium oxide (MAG-OX) 400 MG tablet Take 400 mg by mouth 2 (two) times daily.    . meclizine (ANTIVERT) 12.5 MG tablet Take 12.5 mg by mouth 3 (three) times daily as needed for dizziness.    . methocarbamol (ROBAXIN) 500 MG tablet TAKE ONE (1) TABLET THREE (3) TIMES EACH DAY 60 tablet 4  . metoprolol succinate (TOPROL-XL) 25 MG 24 hr tablet TAKE ONE TABLET TWICE DAILY 180 tablet 3  . Multiple Vitamin (MULTIVITAMIN) capsule Take 1 capsule by mouth daily.    .  multivitamin-lutein (OCUVITE-LUTEIN) CAPS capsule Take 1 capsule by mouth daily.    . nitroGLYCERIN (NITROSTAT) 0.3 MG SL tablet Place 1 tablet (0.3 mg total) under the tongue every 5 (five) minutes x 3 doses as needed for  chest pain. 90 tablet 1  . ondansetron (ZOFRAN) 4 MG tablet Take 1 tablet (4 mg total) by mouth 2 (two) times daily as needed. For nausea (Patient taking differently: Take 4 mg by mouth 2 (two) times daily. For nausea) 20 tablet 0  . OXYGEN Inhale 2 mLs into the lungs.     . pantoprazole (PROTONIX) 40 MG tablet Take 1 tablet (40 mg total) by mouth 2 (two) times daily. 180 tablet 0  . RANEXA 1000 MG SR tablet TAKE ONE (1) TABLET BY MOUTH TWO (2) TIMES DAILY 180 each 2  . rosuvastatin (CRESTOR) 40 MG tablet TAKE ONE (1) TABLET EACH DAY 90 tablet 6  . torsemide (DEMADEX) 20 MG tablet Take 4 tablets (80 mg total) by mouth 2 (two) times daily. 240 tablet 3  . triamterene-hydrochlorothiazide (DYAZIDE) 37.5-25 MG capsule Take 1 capsule by mouth daily.    . vitamin B-12 (CYANOCOBALAMIN) 1000 MCG tablet Take 1,000 mcg by mouth daily.    Marland Kitchen zinc gluconate 50 MG tablet Take 50 mg by mouth at bedtime.     Marland Kitchen zolpidem (AMBIEN) 10 MG tablet Take 10 mg by mouth at bedtime.      No current facility-administered medications for this encounter.     BP (!) 142/54   Pulse (!) 52   Wt (!) 301 lb 8 oz (136.8 kg)   SpO2 93%   BMI 54.70 kg/m  PHYSICAL EXAM General: NAD, obese.  Neck: JVP 9-10 cm, no thyromegaly or thyroid nodule.  Lungs: Decreased breath sounds at bases bilaterally CV: Nondisplaced PMI.  Heart regular S1/S2, no S3/S4, 1/6 SEM RUSB.  1+ ankle edema.  No carotid bruit.  Difficult to palpate pedal pulses.  Abdomen: Soft, nontender, no hepatosplenomegaly, no distention.  Skin: Intact without lesions or rashes.  Neurologic: Alert and oriented x 3.  Psych: Normal affect. Extremities: No clubbing or cyanosis.  HEENT: Normal.   Assessment/Plan: 1. CAD: Occluded native RCA and  SVG-RCA.  Now status post opening of CTO RCA with 4 overlapping Xience DES in 5/15.  Most recent cath in 6/18 showed patent RCA stents, no interventional target.  Mild chronic stable angina.   - Continue ASA, statin, losartan, Crestor, amlodipine, Toprol XL, and ranolazine.  - She will continue Plavix long-term.   - She has not tolerated Imdur in the past. 2. Chronic diastolic CHF:  She is volume overloaded on exam today, NYHA class IIIb symptoms, chronic. Echo in 1/19 with EF 55-60%, moderate diastolic dysfunction.  - Increase torsemide to 80 mg bid.  BMET today and in 10 days.  3. Hyperlipidemia: She remains on Zetia, Crestor and Praluent.  Good lipids in 11/18.   4. Hypertension: BP mildly elevated today but in 644I systolic when she checks at home.  I will not increase BP meds given cerebrovascular disease (do not want BP to fall low).    5. Cerebrovascular disease: She has history of CVA and had left carotid stent in 2/15. Followed by [facility].  Most recent MRA head showed severe stenosis of supraclinoid RICA and severe basilar artery stenosis.   - I would like her to followup with [facility], ?carotid stenting as option for RICA => she has an appointment with Dr. Scot Dock 05/23/17.   6. Palpitations: Improved on higher Toprol XL.  Event monitor in 2/18 with few PVCs.  7. OHS/OSA: Cannot tolerate CPAP.  Uses oxygen with exertion and at night.   Followup in 3 wks with NP/PA.   Loralie Champagne 05/17/2017

## 2017-05-18 ENCOUNTER — Ambulatory Visit: Payer: Medicare Other | Admitting: Orthotics

## 2017-05-23 ENCOUNTER — Encounter: Payer: Self-pay | Admitting: Family

## 2017-05-23 ENCOUNTER — Ambulatory Visit (HOSPITAL_COMMUNITY)
Admission: RE | Admit: 2017-05-23 | Discharge: 2017-05-23 | Disposition: A | Discharge status: Home / Self Care | Payer: Medicare Other | Source: Ambulatory Visit | Attending: Family | Admitting: Family

## 2017-05-23 ENCOUNTER — Other Ambulatory Visit: Payer: Self-pay

## 2017-05-23 ENCOUNTER — Ambulatory Visit (INDEPENDENT_AMBULATORY_CARE_PROVIDER_SITE_OTHER): Payer: Medicare Other | Admitting: Family

## 2017-05-23 VITALS — BP 135/54 | HR 58 | Temp 97.8°F | Resp 20 | Ht 62.0 in | Wt 301.0 lb

## 2017-05-23 DIAGNOSIS — Z6841 Body Mass Index (BMI) 40.0 and over, adult: Secondary | ICD-10-CM

## 2017-05-23 DIAGNOSIS — I2510 Atherosclerotic heart disease of native coronary artery without angina pectoris: Secondary | ICD-10-CM | POA: Insufficient documentation

## 2017-05-23 DIAGNOSIS — I6523 Occlusion and stenosis of bilateral carotid arteries: Secondary | ICD-10-CM | POA: Diagnosis present

## 2017-05-23 DIAGNOSIS — Z8673 Personal history of transient ischemic attack (TIA), and cerebral infarction without residual deficits: Secondary | ICD-10-CM | POA: Diagnosis not present

## 2017-05-23 DIAGNOSIS — E785 Hyperlipidemia, unspecified: Secondary | ICD-10-CM | POA: Insufficient documentation

## 2017-05-23 DIAGNOSIS — R2689 Other abnormalities of gait and mobility: Secondary | ICD-10-CM

## 2017-05-23 DIAGNOSIS — Z959 Presence of cardiac and vascular implant and graft, unspecified: Secondary | ICD-10-CM | POA: Insufficient documentation

## 2017-05-23 DIAGNOSIS — I252 Old myocardial infarction: Secondary | ICD-10-CM | POA: Diagnosis not present

## 2017-05-23 NOTE — Progress Notes (Signed)
Chief Complaint: Follow up Extracranial Carotid Artery Stenosis   History of Present Illness  [name] [name] is a 72 y.o. female who is s/p left carotid stent placement on 03/20/2013 with distal embolic protection by Dr. Trula Slade. This was done in the setting of a symptomatic left carotid stenosis. The patient was having nearly daily episodes of aphasia.  She returns today for follow up. Dr. Jannifer Franklin, her neurologist, requested a carotid Duplex that was performed on 09/27/13, copy of result reviewed which showed minimal bilateral ICA stenosis.   She continues to have TIA's, she is followed by Dr. Jannifer Franklin for this; she has constant vertigo; occasionally has expressive aphasia.  She also has photophobia.  October 2015 stroke occurred in cardiac rehab as manifested by pt could no longer stand up, feeling dizzy; saw Dr. Jannifer Franklin that day, had an MRI, pt states Dr. Jannifer Franklin told her that she had 2 more strokes. She continues to have intermittent expressive aphasia.  Pt states Dr. Jannifer Franklin is not sure of the etiology of her stokes, but states she has two brain aneurysms.   July, 2015 she had what she thinks was a stroke as manifested by feeling hot, off balance, headache, ataxia.  Dr. Jannifer Franklin requested an MRI of her brain to evaluate this and pt states she was told that she had another stroke; she still feels off balance, occasional headaches, occasional crawling sensation on left side of face with exertion, occasional mild expressive aphasia.  She had another stroke in May or June of 2017 as manifested by dizziness and weakness in both legs.  She is scheduled to have another MRI of the brain on 05-20-16 requested by Dr. Jannifer Franklin, to evaluate more dizziness, bilateral legs weakness.  February, 2014 she lost vision in both eyes, her vision has since improved with some loss of acuity. She denies hemiparesis.  She is taking muscle relaxers as prescribed by her PCP, for severe low back pain. She is  putting off ESI's in concern for possible strokes from this as she has had so many strokes and TIA's.    She has continued followup with Dr. Estanislado Pandy for vertebral artery stenosis as well as intracranial aneurysm.   She was hospitalized several times at [facility] for ischemic CAD, 4 stents placed; chest pain. Her cardiologist is Dr. [name].  She has a hx of bariatric surgery in the 1980's and has nausea and sometimes vomiting every morning on an empty stomach.  Pt Diabetic: Yes, pt states her February 2018 A1C was 7.3, Dr. Buddy Duty is her endocrinologist.  Pt smoker: non-smoker  Pt meds include: Statin : Yes, also taking injectable Praluent for difficult to control cholesterol ASA: Yes, 2- 81 mg Other anticoagulants/antiplatelets: Plavix    Past Medical History:  Diagnosis Date  . Abnormality of gait 05/14/2014  . Anemia    hx  . Anginal pain ([place])   . Anxiety   . Asthma   . Bundle branch block, left    chronic/notes 07/18/2013  . Cancer [facility]) April 2016   BCC,  Melanoma  . CHF (congestive heart failure) ([place])   . Chronic bronchitis ([place])    "off and on all the time" (07/18/2013)  . Chronic insomnia 05/06/2015  . Chronic kidney disease    frequency, sses dr Jamal Maes every 4 to 6 months  . Chronic low back pain 08/24/2016  . Chronic lower back pain   . Claustrophobia   . Common migraine 05/14/2014  . Coronary artery disease   . Dysrhythmia   .  GERD (gastroesophageal reflux disease)   . H/O hiatal hernia   . Headache    "probably 4 days/wk" (07/18/2013)  . Heart murmur   . Hyperlipidemia   . Hypertension   . Irregular heart beat   . Leg cramps    both legs at times  . Memory change 05/14/2014  . Mental disorder    hx of confusion  . Migraine    "used to have them really bad; kind of eased up now; down to 1 q couple months" (07/18/2013)  . Myocardial infarction ([place])    04/1999, 02/2000, 01/2005; 2011; 2014  . Neuromuscular disorder ([place])    ?  . Obesity 01-2010  .  Obstructive sleep apnea    "can't wear machine; I have claustrophobia" (07/18/2013)  . Osteoarthritis    "knees and hands" (07/18/2013)  . Other and unspecified angina pectoris   . Peripheral vascular disease (H[facility])    ? numbness, tingling arms and legs  . Pneumonia 2000's   "once"  . PONV (postoperative nausea and vomiting)   . Shortness of breath    with exertion  . Stroke ([place]) 03/22/12   right side brain; denies residual on 07/18/2013)  . Stroke [facility]) Oct. 2015   Affected pt.s balance  . Stroke ([place]) 10/2014  . Type II diabetes mellitus ([place])   . Ventral hernia    hx of  . Vomiting    persistent  . Vomiting blood     Social History Social History   Tobacco Use  . Smoking status: Never Smoker  . Smokeless tobacco: Never Used  Substance Use Topics  . Alcohol use: No  . Drug use: No    Family History Family History  Problem Relation Age of Onset  . Heart disease Father        Heart Disease before age 37  . Diabetes Father   . Hyperlipidemia Father   . Hypertension Father   . Heart attack Father   . Deep vein thrombosis Father   . AAA (abdominal aortic aneurysm) Father   . Hypertension Mother   . Deep vein thrombosis Mother   . AAA (abdominal aortic aneurysm) Mother   . Cancer Sister        Ovarian  . Hypertension Sister   . Diabetes Paternal Grandmother   . Heart disease Paternal Uncle   . Diabetes Paternal Uncle   . Heart attack Paternal Grandfather 62       died of MI at 53  . Diabetes Paternal Aunt   . Diabetes Paternal Uncle   . Colon cancer Neg Hx     Surgical History Past Surgical History:  Procedure Laterality Date  . ANKLE FRACTURE SURGERY Left 1970's  . APPENDECTOMY  1970's   w/hysterectomy  . CARDIAC CATHETERIZATION  10/10/2012   Dr [name].  Marland Kitchen CARDIAC CATHETERIZATION N/A 05/29/2015   Procedure: Right/Left Heart Cath and Coronary/Graft Angiography;  Surgeon: Larey Dresser, MD;  Location: [place] [facility];  Service: Cardiovascular;   Laterality: N/A;  . CAROTID ENDARTERECTOMY Left 03/2013  . CAROTID STENT INSERTION Left 03/20/2013   Procedure: CAROTID STENT INSERTION;  Surgeon: [name], MD;  Location: [facility] CATH LAB;  Service: Cardiovascular;  Laterality: Left;  internal carotid  . CEREBRAL ANGIOGRAM N/A 04/05/2011   Procedure: CEREBRAL ANGIOGRAM;  Surgeon: Angelia Mould, MD;  Location: [facility] CATH LAB;  Service: Cardiovascular;  Laterality: N/A;  . CHOLECYSTECTOMY  2004  . CORONARY ANGIOPLASTY WITH STENT PLACEMENT  01,02,05,06,07,08,11; 04/24/2013   "  I've probably got ~ 10 stents by now" (04/24/2013)  . CORONARY ANGIOPLASTY WITH STENT PLACEMENT  06/13/2013   "got 4 stents today" (06/13/2013)  . CORONARY ARTERY BYPASS GRAFT  1220/11   "CABG X5"  . ESOPHAGOGASTRODUODENOSCOPY  08/03/2011   Procedure: ESOPHAGOGASTRODUODENOSCOPY (EGD);  Surgeon: Shann Medal, MD;  Location: [facility];  Service: General;  Laterality: N/A;  . ESOPHAGOGASTRODUODENOSCOPY (EGD) WITH PROPOFOL N/A 03/11/2014   Procedure: ESOPHAGOGASTRODUODENOSCOPY (EGD) WITH PROPOFOL;  Surgeon: Lafayette Dragon, MD;  Location: [facility];  Service: Endoscopy;  Laterality: N/A;  . FRACTURE SURGERY    . gall stone removal  05/2003  . GASTRIC BYPASS  1984   "stapeling"  . HERNIA REPAIR  2004   "in my stomach; had OR on it twice", wire mesh on 1 hernia  . LEFT HEART CATH AND CORS/GRAFTS ANGIOGRAPHY N/A 07/09/2016   Procedure: LEFT HEART CATH AND CORS/GRAFTS ANGIOGRAPHY;  Surgeon: Larey Dresser, MD;  Location: [place] [facility];  Service: Cardiovascular;  Laterality: N/A;  . PERCUTANEOUS CORONARY STENT INTERVENTION (PCI-S) N/A 06/13/2013   Procedure: PERCUTANEOUS CORONARY STENT INTERVENTION (PCI-S);  Surgeon: [name], MD;  Location: [facility] CATH LAB;  Service: Cardiovascular;  Laterality: N/A;  . PERCUTANEOUS STENT INTERVENTION N/A 04/24/2013   Procedure: PERCUTANEOUS STENT INTERVENTION;  Surgeon: [name], MD;  Location: [facility] CATH LAB;  Service:  Cardiovascular;  Laterality: N/A;  . ROOT CANAL  10/2000  . TIBIA FRACTURE SURGERY Right 1970's   rods and pins  . TOOTH EXTRACTION    . TOTAL ABDOMINAL HYSTERECTOMY  1970's   w/ appendectomy  . TUMOR REMOVAL  1970's   ovarian    Allergies  Allergen Reactions  . Amoxicillin Shortness Of Breath and Rash  . Brilinta [Ticagrelor] Shortness Of Breath  . Erythromycin Shortness Of Breath, Other (See Comments) and Hives    Trouble swallowing  . Flagyl [Metronidazole] Palpitations and Shortness Of Breath  . Penicillins Shortness Of Breath, Rash and Hives  . Isosorbide Mononitrate [Isosorbide Nitrate] Other (See Comments)    Joint aches, muscles hurt, difficult to walk   . Jardiance [Empagliflozin] Other (See Comments)    Nausea, joint aches, muscles aches  . Metformin And Related Other (See Comments)    Stomach pain, cold sweats, joint pain, burred vision, dizziness  . Tape Other (See Comments)    Skin pulls off with certain types Plastic tape causes skin to rip if left on for long periods of time  . Erythromycin Base Rash    Current Outpatient Medications  Medication Sig Dispense Refill  . acetaminophen (TYLENOL) 325 MG tablet Take 650 mg by mouth every 6 (six) hours as needed for mild pain.     Marland Kitchen albuterol (PROVENTIL) (2.5 MG/3ML) 0.083% nebulizer solution Take 2.5 mg by nebulization every 6 (six) hours as needed for shortness of breath (when asthma is active).     Marland Kitchen albuterol (PROVENTIL,VENTOLIN) 90 MCG/ACT inhaler Inhale 2 puffs into the lungs every 6 (six) hours as needed for shortness of breath.     . Alirocumab (PRALUENT) 150 MG/ML SOPN Inject 1 pen into the skin every 14 (fourteen) days. 6 pen 3  . ALPRAZolam (XANAX) 0.5 MG tablet Take 0.5-1 mg by mouth 3 (three) times daily. 0.5mg  in the morning and at lunch, 1mg  at bedtime    . amLODipine (NORVASC) 10 MG tablet Take 10 mg by mouth at bedtime.     Marland Kitchen aspirin EC 81 MG tablet Take 162 mg by mouth daily.    Marland Kitchen  b complex vitamins  capsule Take 1 capsule by mouth daily.    Marland Kitchen buPROPion (WELLBUTRIN XL) 150 MG 24 hr tablet Take 1 tablet by mouth daily.    . Calcium Carbonate 1500 (600 CA) MG TABS Take 1,500 mg by mouth daily with breakfast.    . Cholecalciferol (VITAMIN D) 2000 UNITS tablet Take 2,000 Units by mouth daily with breakfast.     . cloNIDine (CATAPRES) 0.2 MG tablet Take 0.2 mg by mouth 2 (two) times daily.    . clopidogrel (PLAVIX) 75 MG tablet TAKE ONE (1) TABLET BY MOUTH EVERY DAY 90 tablet 3  . denosumab (PROLIA) 60 MG/ML SOLN injection Inject 60 mg into the skin every 6 (six) months. Administer in upper arm, thigh, or abdomen    . DULoxetine (CYMBALTA) 30 MG capsule Take 1 capsule (30 mg total) by mouth 2 (two) times daily. 180 capsule 2  . ezetimibe (ZETIA) 10 MG tablet TAKE ONE (1) TABLET EACH DAY 90 tablet 3  . Fluticasone-Salmeterol (ADVAIR DISKUS) 250-50 MCG/DOSE AEPB Inhale 1 puff into the lungs 2 (two) times daily.     Marland Kitchen gabapentin (NEURONTIN) 600 MG tablet Take 600-1,200 mg by mouth 3 (three) times daily. 600mg  in the morning and at noon. 1200 mg at bedtime.    Marland Kitchen HUMALOG KWIKPEN 100 UNIT/ML SOPN Inject 14-22 Units into the skin 4 (four) times daily. 10 units at every meal and 14 units at bedtime if needed    . HYDROcodone-acetaminophen (NORCO/VICODIN) 5-325 MG per tablet Take 1 tablet by mouth at bedtime as needed for moderate pain.     Marland Kitchen ipratropium-albuterol (DUONEB) 0.5-2.5 (3) MG/3ML SOLN Take 3 mLs by nebulization as needed (If severe asthma , use every 4 hours for 10 days).     Marland Kitchen LANTUS SOLOSTAR 100 UNIT/ML Solostar Pen Inject 54 Units into the skin every morning. Can increase or decrease by 4 units depending on blood sugar levels    . loperamide (IMODIUM) 2 MG capsule Take 2 mg by mouth as needed for diarrhea or loose stools.    Marland Kitchen losartan (COZAAR) 100 MG tablet Take 100 mg by mouth daily with breakfast.     . magnesium oxide (MAG-OX) 400 MG tablet Take 400 mg by mouth 2 (two) times daily.    .  meclizine (ANTIVERT) 12.5 MG tablet Take 12.5 mg by mouth 3 (three) times daily as needed for dizziness.    . methocarbamol (ROBAXIN) 500 MG tablet TAKE ONE (1) TABLET THREE (3) TIMES EACH DAY 60 tablet 4  . metoprolol succinate (TOPROL-XL) 25 MG 24 hr tablet TAKE ONE TABLET TWICE DAILY 180 tablet 3  . Multiple Vitamin (MULTIVITAMIN) capsule Take 1 capsule by mouth daily.    . multivitamin-lutein (OCUVITE-LUTEIN) CAPS capsule Take 1 capsule by mouth daily.    . nitroGLYCERIN (NITROSTAT) 0.3 MG SL tablet Place 1 tablet (0.3 mg total) under the tongue every 5 (five) minutes x 3 doses as needed for chest pain. 90 tablet 1  . ondansetron (ZOFRAN) 4 MG tablet Take 1 tablet (4 mg total) by mouth 2 (two) times daily as needed. For nausea (Patient taking differently: Take 4 mg by mouth 2 (two) times daily. For nausea) 20 tablet 0  . OXYGEN Inhale 2 mLs into the lungs.     . pantoprazole (PROTONIX) 40 MG tablet Take 1 tablet (40 mg total) by mouth 2 (two) times daily. 180 tablet 0  . RANEXA 1000 MG SR tablet TAKE ONE (1) TABLET BY MOUTH TWO (2)  TIMES DAILY 180 each 2  . rosuvastatin (CRESTOR) 40 MG tablet TAKE ONE (1) TABLET EACH DAY 90 tablet 6  . torsemide (DEMADEX) 20 MG tablet Take 4 tablets (80 mg total) by mouth 2 (two) times daily. 240 tablet 3  . triamterene-hydrochlorothiazide (DYAZIDE) 37.5-25 MG capsule Take 1 capsule by mouth daily.    . vitamin B-12 (CYANOCOBALAMIN) 1000 MCG tablet Take 1,000 mcg by mouth daily.    Marland Kitchen zinc gluconate 50 MG tablet Take 50 mg by mouth at bedtime.     Marland Kitchen zolpidem (AMBIEN) 10 MG tablet Take 10 mg by mouth at bedtime.      No current facility-administered medications for this visit.     Review of Systems : See HPI for pertinent positives and negatives.  Physical Examination  Vitals:   05/23/17 1353 05/23/17 1355  BP: (!) 131/57 (!) 135/54  Pulse: (!) 58   Resp: 20   Temp: 97.8 F (36.6 C)   TempSrc: Oral   SpO2: 94%   Weight: (!) 301 lb (136.5 kg)    Height: 5\' 2"  (1.575 m)    Body mass index is 55.05 kg/m.  General: WDWN morbidly obese female in NAD GAIT:normal Eyes: PERRLA Pulmonary: Respirations are non-labored, CTAB but distant sounding Cardiac: regular rhythm, no detected murmur.  VASCULAR EXAM Carotid Bruits Right Left   positive positve   Radial pulses are 1+ palpable and equal.      LE Pulses Right Left   POPLITEAL not palpable  not palpable   POSTERIOR TIBIAL  not palpable  not palpable    DORSALIS PEDIS  ANTERIOR TIBIAL 2+ palpable  2+ palpable     Gastrointestinal: soft, mild tenderness to palpation at right mid abdomen, BS WNL, no r/g, no palpable masses, large panus.  Musculoskeletal: No muscle atrophy/wasting. M/S 4/5 throughout, Extremities without ischemic changes.  Skin: No rashes, no ulcers, no cellulitis.   Neurologic:  A&O X 3; appropriate affect, sensation is normal; speech is normal, CN 2-12 intact, pain and light touch intact in extremities, motor exam as listed above. Psychiatric: Normal thought content, mood appropriate to clinical situation.    Assessment: [name] [name] is a 72 y.o. female whowho is s/p left carotid stent placement on 03/20/2013 with distal embolic protection.  This was done in the setting of a symptomatic left carotid stenosis. The patient was having nearly daily episodes of aphasia.  She has had several more strokes and TIA's. Pt states Dr. Jannifer Franklin is following her for the etiology of her stokes, states she has two brain aneurysms.  She continues to have light headedness and unsteadiness on her feet.  Looking back to 2015 lab results, no B12 level noted; consider checking this if not already checked, will defer to her PCP.   Printed prescription for rolling walker with extra  wide seat given to pt. to help steady her and allow her to walk more.  Her atherosclerotic risk factors include almost in control DM, morbid obesity, dyslipidemia, and CAD. Fortunately she has never used tobacco.   DATA Carotid Duplex (05/23/17): Right ICA: 1-39% stenosis Left ICA: 1-39% stenosis Left carotid stent: no significant stenosis Bilateral ECA: >50% stenosis. Bilateral vertebral artery flow is antegrade.  Bilateral subclavian artery waveforms are normal.  No significant change compared to the exam on 05-17-16.    Plan:  Follow-up in 1year with Carotid Duplex scan.   I discussed in depth with the patient the nature of atherosclerosis, and emphasized the importance of maximal medical management  including strict control of blood pressure, blood glucose, and lipid levels, obtaining regular exercise, and continued cessation of smoking.  The patient is aware that without maximal medical management the underlying atherosclerotic disease process will progress, limiting the benefit of any interventions. The patient was given information about stroke prevention and what symptoms should prompt the patient to seek immediate medical care. Thank you for allowing [place] to participate in this patient's care.  Clemon Chambers, RN, MSN, FNP-C [facility] Office: [phone]  Clinic Physician: Early  05/23/17 1:58 PM

## 2017-05-23 NOTE — Patient Instructions (Signed)

## 2017-05-24 ENCOUNTER — Ambulatory Visit: Payer: Medicare Other

## 2017-05-24 ENCOUNTER — Ambulatory Visit: Payer: Medicare Other | Admitting: Orthotics

## 2017-05-24 DIAGNOSIS — G629 Polyneuropathy, unspecified: Secondary | ICD-10-CM

## 2017-05-24 DIAGNOSIS — E1140 Type 2 diabetes mellitus with diabetic neuropathy, unspecified: Secondary | ICD-10-CM

## 2017-05-24 DIAGNOSIS — E1149 Type 2 diabetes mellitus with other diabetic neurological complication: Principal | ICD-10-CM

## 2017-05-24 DIAGNOSIS — L84 Corns and callosities: Secondary | ICD-10-CM

## 2017-05-24 DIAGNOSIS — M2011 Hallux valgus (acquired), right foot: Secondary | ICD-10-CM

## 2017-05-24 NOTE — Progress Notes (Signed)
Patient measured for DBS per [facility].  PCP is Dr. Buddy Duty; patient presents DM2, PN, and HAV as qualifying conditions.  Chose Orthofeet 997 as shoe and measured 8w on [place].

## 2017-05-27 ENCOUNTER — Other Ambulatory Visit (HOSPITAL_COMMUNITY): Payer: Medicare Other

## 2017-05-30 ENCOUNTER — Ambulatory Visit (HOSPITAL_COMMUNITY)
Admission: RE | Admit: 2017-05-30 | Discharge: 2017-05-30 | Disposition: A | Discharge status: Home / Self Care | Payer: Medicare Other | Source: Ambulatory Visit | Attending: Cardiology | Admitting: Cardiology

## 2017-05-30 DIAGNOSIS — I5032 Chronic diastolic (congestive) heart failure: Secondary | ICD-10-CM | POA: Diagnosis present

## 2017-05-30 LAB — BASIC METABOLIC PANEL
Anion gap: 11 (ref 5–15)
BUN: 21 mg/dL — ABNORMAL HIGH (ref 6–20)
CO2: 27 mmol/L (ref 22–32)
Calcium: 8.6 mg/dL — ABNORMAL LOW (ref 8.9–10.3)
Chloride: 100 mmol/L — ABNORMAL LOW (ref 101–111)
Creatinine, Ser: 1.2 mg/dL — ABNORMAL HIGH (ref 0.44–1.00)
GFR calc Af Amer: 52 mL/min — ABNORMAL LOW (ref >60)
GFR calc non Af Amer: 45 mL/min — ABNORMAL LOW (ref >60)
Glucose, Bld: 190 mg/dL — ABNORMAL HIGH (ref 65–99)
Potassium: 4.7 mmol/L (ref 3.5–5.1)
Sodium: 138 mmol/L (ref 135–145)

## 2017-05-30 NOTE — Telephone Encounter (Signed)
Patient assistance has been approved for Praluent for remainder of 2019 year. Pt is aware to contact PASS program for refills.

## 2017-06-07 ENCOUNTER — Encounter (HOSPITAL_COMMUNITY): Payer: Self-pay

## 2017-06-07 ENCOUNTER — Ambulatory Visit (HOSPITAL_COMMUNITY)
Admission: RE | Admit: 2017-06-07 | Discharge: 2017-06-07 | Disposition: A | Discharge status: Home / Self Care | Payer: Medicare Other | Source: Ambulatory Visit | Attending: Cardiology | Admitting: Cardiology

## 2017-06-07 VITALS — BP 132/72 | HR 64 | Wt 303.6 lb

## 2017-06-07 DIAGNOSIS — Z7982 Long term (current) use of aspirin: Secondary | ICD-10-CM | POA: Diagnosis not present

## 2017-06-07 DIAGNOSIS — I679 Cerebrovascular disease, unspecified: Secondary | ICD-10-CM

## 2017-06-07 DIAGNOSIS — F419 Anxiety disorder, unspecified: Secondary | ICD-10-CM | POA: Diagnosis not present

## 2017-06-07 DIAGNOSIS — I25111 Atherosclerotic heart disease of native coronary artery with angina pectoris with documented spasm: Secondary | ICD-10-CM | POA: Diagnosis not present

## 2017-06-07 DIAGNOSIS — I130 Hypertensive heart and chronic kidney disease with heart failure and stage 1 through stage 4 chronic kidney disease, or unspecified chronic kidney disease: Secondary | ICD-10-CM | POA: Insufficient documentation

## 2017-06-07 DIAGNOSIS — Z7902 Long term (current) use of antithrombotics/antiplatelets: Secondary | ICD-10-CM | POA: Diagnosis not present

## 2017-06-07 DIAGNOSIS — I10 Essential (primary) hypertension: Secondary | ICD-10-CM | POA: Diagnosis not present

## 2017-06-07 DIAGNOSIS — Z8673 Personal history of transient ischemic attack (TIA), and cerebral infarction without residual deficits: Secondary | ICD-10-CM | POA: Insufficient documentation

## 2017-06-07 DIAGNOSIS — K219 Gastro-esophageal reflux disease without esophagitis: Secondary | ICD-10-CM | POA: Diagnosis not present

## 2017-06-07 DIAGNOSIS — Z6841 Body Mass Index (BMI) 40.0 and over, adult: Secondary | ICD-10-CM | POA: Diagnosis not present

## 2017-06-07 DIAGNOSIS — I6523 Occlusion and stenosis of bilateral carotid arteries: Secondary | ICD-10-CM | POA: Diagnosis not present

## 2017-06-07 DIAGNOSIS — Z951 Presence of aortocoronary bypass graft: Secondary | ICD-10-CM | POA: Diagnosis not present

## 2017-06-07 DIAGNOSIS — I5032 Chronic diastolic (congestive) heart failure: Secondary | ICD-10-CM | POA: Diagnosis present

## 2017-06-07 DIAGNOSIS — G4733 Obstructive sleep apnea (adult) (pediatric): Secondary | ICD-10-CM | POA: Insufficient documentation

## 2017-06-07 DIAGNOSIS — I2510 Atherosclerotic heart disease of native coronary artery without angina pectoris: Secondary | ICD-10-CM | POA: Insufficient documentation

## 2017-06-07 DIAGNOSIS — R002 Palpitations: Secondary | ICD-10-CM | POA: Insufficient documentation

## 2017-06-07 DIAGNOSIS — N189 Chronic kidney disease, unspecified: Secondary | ICD-10-CM | POA: Diagnosis not present

## 2017-06-07 DIAGNOSIS — Z79899 Other long term (current) drug therapy: Secondary | ICD-10-CM | POA: Diagnosis not present

## 2017-06-07 DIAGNOSIS — E785 Hyperlipidemia, unspecified: Secondary | ICD-10-CM | POA: Diagnosis not present

## 2017-06-07 DIAGNOSIS — Z794 Long term (current) use of insulin: Secondary | ICD-10-CM | POA: Diagnosis not present

## 2017-06-07 DIAGNOSIS — K449 Diaphragmatic hernia without obstruction or gangrene: Secondary | ICD-10-CM | POA: Insufficient documentation

## 2017-06-07 DIAGNOSIS — Z8249 Family history of ischemic heart disease and other diseases of the circulatory system: Secondary | ICD-10-CM | POA: Insufficient documentation

## 2017-06-07 DIAGNOSIS — E1122 Type 2 diabetes mellitus with diabetic chronic kidney disease: Secondary | ICD-10-CM | POA: Insufficient documentation

## 2017-06-07 MED ORDER — METOLAZONE 2.5 MG PO TABS: 2.5000 mg | ORAL_TABLET | ORAL | 3 refills | Status: DC

## 2017-06-07 NOTE — Progress Notes (Signed)
Patient ID: [name] [name], female   DOB: [birth year], 72 y.o.   [name]: [id number] PCP: Dr. Sandi Mariscal Cardiology: Dr. [name]  72 y.o. with history of CAD s/p CABG, diastolic CHF, and cerebrovascular disease with history of CVA presents for followup of CHF and diastolic CHF. She had CABG x 5 in 12/11.  Prior to the CABG she had multiple PCIs.  She had a CVA in 2/14 that presented as visual loss.  She had a left carotid stent in 2/15.   Left heart cath done in Sept. 2014. This showed patent SVG-D, LIMA-LAD, and sequential SVG-OM branches but SVG-RCA and the native RCA were both totally occluded. It was felt aht her increased symptoms coincided with occlusion of SVG-RCA.  She was started on Imdur to see if this would help with the chest pain and dyspnea.  However, she feels like Imdur causes leg cramps and does not think that she can take it.  So ranolazine 500 mg bid started and titrated this up to 1000 mg bid.  This helped some but not markedly.  Therefore, sent to  Dr. Irish Lack to address opening RCA CTO. He was able to do this in 5/15 with 4 overlapping Xience DES.  This led to resolution of exertional chest pain.    [name] was started on Brilinta after PCI.  She became much more short of breath after starting on Brilinta. Per Dr Delano Metz stopped and replaced with Plavix.  Dyspnea improved off Brilinta. Given increasing exertional dyspnea and chest pain,  RHC/LHC was done in 4/17.  This showed stable CAD with no interventional target.  Left and right heart filling pressures were not significantly increased.  Medical management.   She had an MRI/MRA in May 2018 that showed moderately severe stenosis of the supraclinoid segment of the right internal carotid artery, progressed since the 2016 MR angiogram and severe basilar artery stenosis.   She developed recurrent exertional chest pain and had LHC in 6/18, showing 4/5 grafts patent with patent native RCA (similar to prior cath, no changes).     She returns today for HF follow up. Last visit torsemide was increased to 80 mg BID. Overall, she feels slightly worse. She is more SOB with minimal walking. She has chronic orthopnea. BLE edema is slightly improved, but abdominal swelling is worse. She had two episodes of exertional CP and SOB since last visit, which were relieved by 1 SL nitro and rest. She has chronic dizziness that is unchanged (follows with neuro). Weights are fluctuating at home 294-299 lbs. She cooks all of her meals and limits salt. She drinks much less than 2 L.Compliant with medications.   Echo 02/2017: EF 55-60%, moderate diastolic dysfunction, PASP 50 mmHg, mildly dilated RV, mild AS, mild-moderate MR.   Labs (6/14): K 3.8, creatinine 0.71 Labs (8/14): BNP 249, LDL 166 Labs (9/14): K 4.7, creatinine 0.7 Labs (9/14): K 4.6, creatinine 0.9, BNP 109 Labs (1/15): K 4.5, creatinine 0.73, LDL 212, HCT 43.4, TSH normal, BNP 138 Labs (6/15): K 4.7, creatinine 1.17, LDL 100, HDL 37, TGs 363, BNP 39 Labs (10/15): K 4.6, creatinine 1.2 Labs (1/16): LDL 157, HDL 43, K 5.2, creatinine 0.95 Labs (2/16): K 4.5, creatinine 1.08, BNP 113 Labs (4/16): LDL 50, HDL 56, TGs 179 Labs (9/16): K 5.3, creatinine 1.09 Labs (10/16): LDL 75, HDL 56 Labs (2/17): K 5.2, creatinine 1.07 Labs (4/17): K 5.1, creatinine 1.07 Labs (5/17): K 4.5, creatinine 1.01, LDL 96, HDL 56, BNP 70 Labs (  03/2016) K 4.9, creatinine 1.07 Labs (7/18): K 4.9, creatinine 1.15 Labs (11/18): K 5.1, creatinine 1.11, LDL 36 Labs (4/19): K 4.7, creatinine 1.2  PMH: 1. Diabetic gastroparesis 2. Type II diabetes 3. HTN 4. Morbid obesity 5. CAD: s/p CABG in 12/11 after prior PCIs.  LIMA-LAD, SVG-D, seq SVG-OM1 and OM2, SVG-PDA. Adenosine Cardiolite (8/14) with EF 53% and a small reversible apical defect with a medium, partially reversible inferior defect.  LHC (9/14) with patent SVG-D, LIMA-LAD (50% distal LAD), and sequential SVG-OM branches; the SVG-RCA and the  native RCA were occluded.  This was managed medically initially, but with ongoing exertional chest pain, it was decided to open CTO.  Patient had CTO opening with 4 overlapping Xience DES in the RCA in 5/15.   - LHC (4/17): SVG-D patent, LIMA-LAD patent, distal LAD with several 40-50% stenoses, sequential SVG-OM1 and PLOM patent with 50% proximal stenosis (not flow limiting), SVG-RCA TO with patent RCA stents.  - LHC (6/18): 4/5 grafts patent (SVG-RCA TO, same as past), RCA stents patent => no change.  6. Atypical migraines 7. OHS/OSA: Intolerant of CPAP. Uses oxygen with exertion and at night.  8. GERD with hiatal hernia 9. OA 10. Diastolic CHF: Echo (41/66) with EF 50-55%, grade II diastolic dysfunction, mild-moderate MR.  Echo (8/14) with EF 55-60%, grade II diastolic dysfunction, mildly increased aortic valve gradient (mean 12 mmHg) but valve opens well, mild MR and mild RV dilation.  Echo (9/15) with EF 60-65%, grade II diastolic dysfunction, mild aortic stenosis, mild mitral stenosis, mild to moderate mitral regurgitation, mildly dilated RV with normal systolic function, PA systolic pressure 46 mmHg.  - RHC (4/17): mean RA 9, PA 38/15 mean 27, mean PCWP 9, CI 2.5.  - Echo (5/17): EF 55-60%, mild LVH, mildly dilated RV with low normal systolic function, moderate TR, PASP 61 mmHg - Echo (1/19): EF 55-60%, moderate diastolic dysfunction, PASP 50 mmHg, mildly dilated RV, mild AS, mild-moderate MR. 11. CKD 12. Chronic LBBB 13. Anxiety 14. Carotid stenosis: Followed by [facility], >06% LICA stenosis 30/16.  She had left carotid stent in 2/15. Carotid dopplers 8/15 with no significant disease. Carotid dopplers (4/16): < 40% RICA stenosis.  15. Cerebrovascular disease: CVA 2/14 with right posterior cerebral artery territory ischemic infarction. Cerebral angiogram in 6/14 showed 70% right vertebral ostial stenosis, 01-09% LICA stenosis, > 32% proximal left posterior cerebral artery stenosis, posterior  communicating artery aneurysm.  Patient has had episodes of transient expressive aphasia.  Carotid dopplers (35/57) showed >32% LICA stenosis. She had a left carotid stent 2/15.  Possible CVA in 7/15. -  MRI/MRA in May 2018 that showed moderately severe stenosis of the supraclinoid segment of the right internal carotid artery, progressed since the 2016 MR angiogram and severe basilar artery stenosis.  16. Positional vertigo (suspected) 17. Palpitations: Holter (6/15) with rare PVCs/PACs.  - Event monitor (2/18): NSR, occasional PVCs 18. Dyspnea with Brilinta. 19. Melanoma: On face, s/p excision.   20. Aortic stenosis: Mild.  21. Holter (8/16) with no significant arrhythmias.  22. Lower extremity arterial dopplers (9/16) were normal.  23. Sleep study (4/18): No significant OSA.   SH: Married, nonsmoker  FH: CAD  ROS: All systems reviewed and negative except as per HPI.   Current Outpatient Medications  Medication Sig Dispense Refill  . acetaminophen (TYLENOL) 325 MG tablet Take 650 mg by mouth every 6 (six) hours as needed for mild pain.     Marland Kitchen albuterol (PROVENTIL) (2.5 MG/3ML) 0.083% nebulizer solution Take  2.5 mg by nebulization every 6 (six) hours as needed for shortness of breath (when asthma is active).     Marland Kitchen albuterol (PROVENTIL,VENTOLIN) 90 MCG/ACT inhaler Inhale 2 puffs into the lungs every 6 (six) hours as needed for shortness of breath.     . Alirocumab (PRALUENT) 150 MG/ML SOPN Inject 1 pen into the skin every 14 (fourteen) days. 6 pen 3  . ALPRAZolam (XANAX) 0.5 MG tablet Take 0.5-1 mg by mouth 3 (three) times daily. 0.5mg  in the morning and at lunch, 1mg  at bedtime    . amLODipine (NORVASC) 10 MG tablet Take 10 mg by mouth at bedtime.     Marland Kitchen aspirin EC 81 MG tablet Take 162 mg by mouth daily.    Marland Kitchen b complex vitamins capsule Take 1 capsule by mouth daily.    Marland Kitchen buPROPion (WELLBUTRIN XL) 150 MG 24 hr tablet Take 1 tablet by mouth daily.    . Calcium Carbonate 1500 (600 CA) MG  TABS Take 1,500 mg by mouth daily with breakfast.    . Cholecalciferol (VITAMIN D) 2000 UNITS tablet Take 2,000 Units by mouth daily with breakfast.     . cloNIDine (CATAPRES) 0.2 MG tablet Take 0.2 mg by mouth 2 (two) times daily.    . clopidogrel (PLAVIX) 75 MG tablet TAKE ONE (1) TABLET BY MOUTH EVERY DAY 90 tablet 3  . denosumab (PROLIA) 60 MG/ML SOLN injection Inject 60 mg into the skin every 6 (six) months. Administer in upper arm, thigh, or abdomen    . DULoxetine (CYMBALTA) 30 MG capsule Take 1 capsule (30 mg total) by mouth 2 (two) times daily. 180 capsule 2  . ezetimibe (ZETIA) 10 MG tablet TAKE ONE (1) TABLET EACH DAY 90 tablet 3  . Fluticasone-Salmeterol (ADVAIR DISKUS) 250-50 MCG/DOSE AEPB Inhale 1 puff into the lungs 2 (two) times daily.     Marland Kitchen gabapentin (NEURONTIN) 600 MG tablet Take 600-1,200 mg by mouth 3 (three) times daily. 600mg  in the morning and at noon. 1200 mg at bedtime.    Marland Kitchen HUMALOG KWIKPEN 100 UNIT/ML SOPN Inject 14-22 Units into the skin 4 (four) times daily. 10 units at every meal and 14 units at bedtime if needed    . HYDROcodone-acetaminophen (NORCO/VICODIN) 5-325 MG per tablet Take 1 tablet by mouth at bedtime as needed for moderate pain.     Marland Kitchen ipratropium-albuterol (DUONEB) 0.5-2.5 (3) MG/3ML SOLN Take 3 mLs by nebulization as needed (If severe asthma , use every 4 hours for 10 days).     Marland Kitchen LANTUS SOLOSTAR 100 UNIT/ML Solostar Pen Inject 54 Units into the skin every morning. Can increase or decrease by 4 units depending on blood sugar levels    . loperamide (IMODIUM) 2 MG capsule Take 2 mg by mouth as needed for diarrhea or loose stools.    Marland Kitchen losartan (COZAAR) 100 MG tablet Take 100 mg by mouth daily with breakfast.     . magnesium oxide (MAG-OX) 400 MG tablet Take 400 mg by mouth 2 (two) times daily.    . meclizine (ANTIVERT) 12.5 MG tablet Take 12.5 mg by mouth 3 (three) times daily as needed for dizziness.    . methocarbamol (ROBAXIN) 500 MG tablet TAKE ONE (1)  TABLET THREE (3) TIMES EACH DAY 60 tablet 4  . metoprolol succinate (TOPROL-XL) 25 MG 24 hr tablet TAKE ONE TABLET TWICE DAILY 180 tablet 3  . Multiple Vitamin (MULTIVITAMIN) capsule Take 1 capsule by mouth daily.    . multivitamin-lutein (OCUVITE-LUTEIN) CAPS capsule  Take 1 capsule by mouth daily.    . nitroGLYCERIN (NITROSTAT) 0.3 MG SL tablet Place 1 tablet (0.3 mg total) under the tongue every 5 (five) minutes x 3 doses as needed for chest pain. 90 tablet 1  . ondansetron (ZOFRAN) 4 MG tablet Take 1 tablet (4 mg total) by mouth 2 (two) times daily as needed. For nausea (Patient taking differently: Take 4 mg by mouth 2 (two) times daily. For nausea) 20 tablet 0  . OXYGEN Inhale 2 mLs into the lungs.     . pantoprazole (PROTONIX) 40 MG tablet Take 1 tablet (40 mg total) by mouth 2 (two) times daily. 180 tablet 0  . RANEXA 1000 MG SR tablet TAKE ONE (1) TABLET BY MOUTH TWO (2) TIMES DAILY 180 each 2  . rosuvastatin (CRESTOR) 40 MG tablet TAKE ONE (1) TABLET EACH DAY 90 tablet 6  . torsemide (DEMADEX) 20 MG tablet Take 4 tablets (80 mg total) by mouth 2 (two) times daily. 240 tablet 3  . triamterene-hydrochlorothiazide (DYAZIDE) 37.5-25 MG capsule Take 1 capsule by mouth daily.    . vitamin B-12 (CYANOCOBALAMIN) 1000 MCG tablet Take 1,000 mcg by mouth daily.    Marland Kitchen zinc gluconate 50 MG tablet Take 50 mg by mouth at bedtime.     Marland Kitchen zolpidem (AMBIEN) 10 MG tablet Take 10 mg by mouth at bedtime.      No current facility-administered medications for this encounter.     BP 132/72   Pulse 64   Wt (!) 303 lb 9.6 oz (137.7 kg)   SpO2 94%   BMI 55.53 kg/m   Filed Weights   06/07/17 1112  Weight: (!) 303 lb 9.6 oz (137.7 kg)    PHYSICAL EXAM General: Obese. SOB walking into clinic HEENT: Normal Neck: Supple. JVP 8-10. Carotids 2+ bilat; no bruits. No thyromegaly or nodule noted. Cor: PMI nondisplaced. RRR, 2/6 early SEM RUSB Lungs: CTAB, normal effort. Abdomen: Soft, non-tender,  non-distended, no HSM. No bruits or masses. +BS  Extremities: No cyanosis, clubbing, or rash. R and LLE 2+ edema to knees Neuro: Alert & orientedx3, cranial nerves grossly intact. moves all 4 extremities w/o difficulty. Affect pleasant  Assessment/Plan: 1. CAD: Occluded native RCA and SVG-RCA.  Now status post opening of CTO RCA with 4 overlapping Xience DES in 5/15.  Most recent cath in 6/18 showed patent RCA stents, no interventional target. Mild chronic stable angina.  - Continue ASA, statin, losartan, Crestor, amlodipine, Toprol XL, and ranolazine.  - She will continue Plavix long-term.   - She has not tolerated Imdur in the past. - She had two episodes of exertional CP and SOB relieved by 1 SL nitro. She knows to seek care if CP becomes more frequent or worsens.   2. Chronic diastolic CHF:  Echo 6/30 with EF 55-60%, moderate diastolic dysfunction.  - Volume status elevated, NYHA class IIIb symptoms. Weight is up 3 lbs after increased torsemide - Continue Torsemide 80 mg BID - Take metolazone 2.5 mg today and tomorrow, then start metolazone 2.5 mg once weekly.  - No spiro with borderline high K (4.7) 3. Hyperlipidemia:  - She remains on Zetia, Crestor and Praluent. Good lipids in 11/18.   4. Hypertension: BP elevated on arrival, but improved on recheck. Will not increase BP meds given cerebrovascular disease (do not want BP to fall low).    5. Cerebrovascular disease: She has history of CVA and had left carotid stent in 2/15.  - Carotid US 04/2017:  >50%  ECA stenosis bilaterally, 1-39% ICA stenosis bilaterally  - Follows with vascular surgery.  6. Palpitations:  - Improved on higher Toprol XL. Event monitor in 2/18 with few PVCs.  7. OHS/OSA:  - Cannot tolerate CPAP.  Uses 2L oxygen with exertion and at night.   Metolazone 2.5 mg today and tomorrow, then metolazone 2.5 mg weekly.  Follow up in 2 weeks.    Georgiana Shore, NP 06/07/2017

## 2017-06-07 NOTE — Patient Instructions (Signed)
START taking metolazone 2.5 mg.  Take one tablet today and tomorrow, then taking once weekly (every Monday).   Follow up in 2 weeks.

## 2017-06-21 ENCOUNTER — Ambulatory Visit (HOSPITAL_COMMUNITY)
Admission: RE | Admit: 2017-06-21 | Discharge: 2017-06-21 | Disposition: A | Discharge status: Home / Self Care | Payer: Medicare Other | Source: Ambulatory Visit | Attending: Cardiology | Admitting: Cardiology

## 2017-06-21 ENCOUNTER — Encounter (HOSPITAL_COMMUNITY): Payer: Self-pay

## 2017-06-21 VITALS — BP 126/80 | HR 55 | Wt 292.0 lb

## 2017-06-21 DIAGNOSIS — Z8673 Personal history of transient ischemic attack (TIA), and cerebral infarction without residual deficits: Secondary | ICD-10-CM | POA: Insufficient documentation

## 2017-06-21 DIAGNOSIS — I5032 Chronic diastolic (congestive) heart failure: Secondary | ICD-10-CM

## 2017-06-21 DIAGNOSIS — Z7982 Long term (current) use of aspirin: Secondary | ICD-10-CM | POA: Insufficient documentation

## 2017-06-21 DIAGNOSIS — F329 Major depressive disorder, single episode, unspecified: Secondary | ICD-10-CM | POA: Insufficient documentation

## 2017-06-21 DIAGNOSIS — I447 Left bundle-branch block, unspecified: Secondary | ICD-10-CM | POA: Diagnosis not present

## 2017-06-21 DIAGNOSIS — Z955 Presence of coronary angioplasty implant and graft: Secondary | ICD-10-CM | POA: Insufficient documentation

## 2017-06-21 DIAGNOSIS — E1143 Type 2 diabetes mellitus with diabetic autonomic (poly)neuropathy: Secondary | ICD-10-CM | POA: Insufficient documentation

## 2017-06-21 DIAGNOSIS — E662 Morbid (severe) obesity with alveolar hypoventilation: Secondary | ICD-10-CM | POA: Insufficient documentation

## 2017-06-21 DIAGNOSIS — E1122 Type 2 diabetes mellitus with diabetic chronic kidney disease: Secondary | ICD-10-CM | POA: Diagnosis not present

## 2017-06-21 DIAGNOSIS — I130 Hypertensive heart and chronic kidney disease with heart failure and stage 1 through stage 4 chronic kidney disease, or unspecified chronic kidney disease: Secondary | ICD-10-CM | POA: Insufficient documentation

## 2017-06-21 DIAGNOSIS — I2510 Atherosclerotic heart disease of native coronary artery without angina pectoris: Secondary | ICD-10-CM | POA: Diagnosis not present

## 2017-06-21 DIAGNOSIS — Z8249 Family history of ischemic heart disease and other diseases of the circulatory system: Secondary | ICD-10-CM | POA: Insufficient documentation

## 2017-06-21 DIAGNOSIS — I679 Cerebrovascular disease, unspecified: Secondary | ICD-10-CM | POA: Diagnosis not present

## 2017-06-21 DIAGNOSIS — Z7902 Long term (current) use of antithrombotics/antiplatelets: Secondary | ICD-10-CM | POA: Insufficient documentation

## 2017-06-21 DIAGNOSIS — F419 Anxiety disorder, unspecified: Secondary | ICD-10-CM | POA: Insufficient documentation

## 2017-06-21 DIAGNOSIS — Z951 Presence of aortocoronary bypass graft: Secondary | ICD-10-CM | POA: Insufficient documentation

## 2017-06-21 DIAGNOSIS — I493 Ventricular premature depolarization: Secondary | ICD-10-CM | POA: Diagnosis not present

## 2017-06-21 DIAGNOSIS — E785 Hyperlipidemia, unspecified: Secondary | ICD-10-CM | POA: Diagnosis not present

## 2017-06-21 DIAGNOSIS — G4733 Obstructive sleep apnea (adult) (pediatric): Secondary | ICD-10-CM | POA: Diagnosis not present

## 2017-06-21 DIAGNOSIS — F32A Depression, unspecified: Secondary | ICD-10-CM

## 2017-06-21 DIAGNOSIS — K449 Diaphragmatic hernia without obstruction or gangrene: Secondary | ICD-10-CM | POA: Diagnosis not present

## 2017-06-21 DIAGNOSIS — Z6841 Body Mass Index (BMI) 40.0 and over, adult: Secondary | ICD-10-CM | POA: Diagnosis not present

## 2017-06-21 DIAGNOSIS — K219 Gastro-esophageal reflux disease without esophagitis: Secondary | ICD-10-CM | POA: Diagnosis not present

## 2017-06-21 DIAGNOSIS — I6529 Occlusion and stenosis of unspecified carotid artery: Secondary | ICD-10-CM

## 2017-06-21 DIAGNOSIS — N189 Chronic kidney disease, unspecified: Secondary | ICD-10-CM | POA: Diagnosis not present

## 2017-06-21 DIAGNOSIS — I10 Essential (primary) hypertension: Secondary | ICD-10-CM | POA: Diagnosis not present

## 2017-06-21 DIAGNOSIS — R002 Palpitations: Secondary | ICD-10-CM | POA: Insufficient documentation

## 2017-06-21 DIAGNOSIS — Z79899 Other long term (current) drug therapy: Secondary | ICD-10-CM | POA: Insufficient documentation

## 2017-06-21 DIAGNOSIS — Z794 Long term (current) use of insulin: Secondary | ICD-10-CM | POA: Insufficient documentation

## 2017-06-21 LAB — BASIC METABOLIC PANEL
Anion gap: 11 (ref 5–15)
BUN: 19 mg/dL (ref 6–20)
CO2: 30 mmol/L (ref 22–32)
Calcium: 9.1 mg/dL (ref 8.9–10.3)
Chloride: 102 mmol/L (ref 101–111)
Creatinine, Ser: 1.37 mg/dL — ABNORMAL HIGH (ref 0.44–1.00)
GFR calc Af Amer: 44 mL/min — ABNORMAL LOW (ref >60)
GFR calc non Af Amer: 38 mL/min — ABNORMAL LOW (ref >60)
Glucose, Bld: 175 mg/dL — ABNORMAL HIGH (ref 65–99)
Potassium: 4.3 mmol/L (ref 3.5–5.1)
Sodium: 143 mmol/L (ref 135–145)

## 2017-06-21 LAB — MAGNESIUM: Magnesium: 2 mg/dL (ref 1.7–2.4)

## 2017-06-21 NOTE — Progress Notes (Signed)
Patient ID: ADORE KITHCART, female   DOB: 1945/02/21, 72 y.o.   [name]: [id number] PCP: Dr. Sandi Mariscal Cardiology: Dr. [name]  72 y.o. with history of CAD s/p CABG, diastolic CHF, and cerebrovascular disease with history of CVA presents for followup of CHF and diastolic CHF. She had CABG x 5 in 12/11.  Prior to the CABG she had multiple PCIs.  She had a CVA in 2/14 that presented as visual loss.  She had a left carotid stent in 2/15.   Left heart cath done in Sept. 2014. This showed patent SVG-D, LIMA-LAD, and sequential SVG-OM branches but SVG-RCA and the native RCA were both totally occluded. It was felt aht her increased symptoms coincided with occlusion of SVG-RCA.  She was started on Imdur to see if this would help with the chest pain and dyspnea.  However, she feels like Imdur causes leg cramps and does not think that she can take it.  So ranolazine 500 mg bid started and titrated this up to 1000 mg bid.  This helped some but not markedly.  Therefore, sent to  Dr. Irish Lack to address opening RCA CTO. He was able to do this in 5/15 with 4 overlapping Xience DES.  This led to resolution of exertional chest pain.    Mrs Helman was started on Brilinta after PCI.  She became much more short of breath after starting on Brilinta. Per Dr Delano Metz stopped and replaced with Plavix.  Dyspnea improved off Brilinta. Given increasing exertional dyspnea and chest pain,  RHC/LHC was done in 4/17.  This showed stable CAD with no interventional target.  Left and right heart filling pressures were not significantly increased.  Medical management.   She had an MRI/MRA in May 2018 that showed moderately severe stenosis of the supraclinoid segment of the right internal carotid artery, progressed since the 2016 MR angiogram and severe basilar artery stenosis.   She developed recurrent exertional chest pain and had LHC in 6/18, showing 4/5 grafts patent with patent native RCA (similar to prior cath, no changes).     Echo 1/19 with EF 55-60%, moderate diastolic dysfunction, PASP 50 mmHg, mildly dilated RV, mild AS, mild-moderate MR.   She returns for HF follow up. Last visit her torsemide was increased. Weight is down 11 lbs today. SOB is about the same. She is SOB with walking short distances. Activity is limited by leg pain. She has chronic orthopnea. Denies PND. Edema has improved. She is having BLE cramps. She has CP about once/week that occurs walking upstairs and resolve quickly with rest. This is at its baseline. She continues to struggle with depression and has a hard time leaving her house. She has ongoing fatigue. Appetite is good. She has a rash on her chest that is being treated by her PCP. Weights ~290 lbs. Taking all medications. She is dieting with high protein, low carb meals. Limiting salt and fluid intake.   Labs (6/14): K 3.8, creatinine 0.71 Labs (8/14): BNP 249, LDL 166 Labs (9/14): K 4.7, creatinine 0.7 Labs (9/14): K 4.6, creatinine 0.9, BNP 109 Labs (1/15): K 4.5, creatinine 0.73, LDL 212, HCT 43.4, TSH normal, BNP 138 Labs (6/15): K 4.7, creatinine 1.17, LDL 100, HDL 37, TGs 363, BNP 39 Labs (10/15): K 4.6, creatinine 1.2 Labs (1/16): LDL 157, HDL 43, K 5.2, creatinine 0.95 Labs (2/16): K 4.5, creatinine 1.08, BNP 113 Labs (4/16): LDL 50, HDL 56, TGs 179 Labs (9/16): K 5.3, creatinine 1.09 Labs (10/16): LDL 75,  HDL 56 Labs (2/17): K 5.2, creatinine 1.07 Labs (4/17): K 5.1, creatinine 1.07 Labs (5/17): K 4.5, creatinine 1.01, LDL 96, HDL 56, BNP 70 Labs (03/2016) K 4.9, creatinine 1.07 Labs (7/18): K 4.9, creatinine 1.15 Labs (11/18): K 5.1, creatinine 1.11, LDL 36 Labs (1/19): K 4.9, creatinine 1.22 Labs (4/19): K 4.7, creatinine 1.2  PMH: 1. Diabetic gastroparesis 2. Type II diabetes 3. HTN 4. Morbid obesity 5. CAD: s/p CABG in 12/11 after prior PCIs.  LIMA-LAD, SVG-D, seq SVG-OM1 and OM2, SVG-PDA. Adenosine Cardiolite (8/14) with EF 53% and a small reversible apical  defect with a medium, partially reversible inferior defect.  LHC (9/14) with patent SVG-D, LIMA-LAD (50% distal LAD), and sequential SVG-OM branches; the SVG-RCA and the native RCA were occluded.  This was managed medically initially, but with ongoing exertional chest pain, it was decided to open CTO.  Patient had CTO opening with 4 overlapping Xience DES in the RCA in 5/15.   - LHC (4/17): SVG-D patent, LIMA-LAD patent, distal LAD with several 40-50% stenoses, sequential SVG-OM1 and PLOM patent with 50% proximal stenosis (not flow limiting), SVG-RCA TO with patent RCA stents.  - LHC (6/18): 4/5 grafts patent (SVG-RCA TO, same as past), RCA stents patent => no change.  6. Atypical migraines 7. OHS/OSA: Intolerant of CPAP. Uses oxygen with exertion and at night.  8. GERD with hiatal hernia 9. OA 10. Diastolic CHF: Echo (94/49) with EF 50-55%, grade II diastolic dysfunction, mild-moderate MR.  Echo (8/14) with EF 55-60%, grade II diastolic dysfunction, mildly increased aortic valve gradient (mean 12 mmHg) but valve opens well, mild MR and mild RV dilation.  Echo (9/15) with EF 60-65%, grade II diastolic dysfunction, mild aortic stenosis, mild mitral stenosis, mild to moderate mitral regurgitation, mildly dilated RV with normal systolic function, PA systolic pressure 46 mmHg.  - RHC (4/17): mean RA 9, PA 38/15 mean 27, mean PCWP 9, CI 2.5.  - Echo (5/17): EF 55-60%, mild LVH, mildly dilated RV with low normal systolic function, moderate TR, PASP 61 mmHg - Echo (1/19): EF 55-60%, moderate diastolic dysfunction, PASP 50 mmHg, mildly dilated RV, mild AS, mild-moderate MR. 11. CKD 12. Chronic LBBB 13. Anxiety 14. Carotid stenosis: Followed by [facility], >67% LICA stenosis 59/16.  She had left carotid stent in 2/15. Carotid dopplers 8/15 with no significant disease. Carotid dopplers (4/16): < 40% RICA stenosis.  15. Cerebrovascular disease: CVA 2/14 with right posterior cerebral artery territory ischemic  infarction. Cerebral angiogram in 6/14 showed 70% right vertebral ostial stenosis, 38-46% LICA stenosis, > 65% proximal left posterior cerebral artery stenosis, posterior communicating artery aneurysm.  Patient has had episodes of transient expressive aphasia.  Carotid dopplers (99/35) showed >70% LICA stenosis. She had a left carotid stent 2/15.  Possible CVA in 7/15. -  MRI/MRA in May 2018 that showed moderately severe stenosis of the supraclinoid segment of the right internal carotid artery, progressed since the 2016 MR angiogram and severe basilar artery stenosis.  16. Positional vertigo (suspected) 17. Palpitations: Holter (6/15) with rare PVCs/PACs.  - Event monitor (2/18): NSR, occasional PVCs 18. Dyspnea with Brilinta. 19. Melanoma: On face, s/p excision.   20. Aortic stenosis: Mild.  21. Holter (8/16) with no significant arrhythmias.  22. Lower extremity arterial dopplers (9/16) were normal.  23. Sleep study (4/18): No significant OSA.   SH: Married, nonsmoker  FH: CAD  Review of systems complete and found to be negative unless listed in HPI.   Current Outpatient Medications  Medication Sig  Dispense Refill  . acetaminophen (TYLENOL) 325 MG tablet Take 650 mg by mouth every 6 (six) hours as needed for mild pain.     Marland Kitchen albuterol (PROVENTIL) (2.5 MG/3ML) 0.083% nebulizer solution Take 2.5 mg by nebulization every 6 (six) hours as needed for shortness of breath (when asthma is active).     Marland Kitchen albuterol (PROVENTIL,VENTOLIN) 90 MCG/ACT inhaler Inhale 2 puffs into the lungs every 6 (six) hours as needed for shortness of breath.     . Alirocumab (PRALUENT) 150 MG/ML SOPN Inject 1 pen into the skin every 14 (fourteen) days. 6 pen 3  . ALPRAZolam (XANAX) 0.5 MG tablet Take 0.5-1 mg by mouth 3 (three) times daily. 0.5mg  in the morning and at lunch, 1mg  at bedtime    . amLODipine (NORVASC) 10 MG tablet Take 10 mg by mouth at bedtime.     Marland Kitchen aspirin EC 81 MG tablet Take 162 mg by mouth daily.     Marland Kitchen b complex vitamins capsule Take 1 capsule by mouth daily.    Marland Kitchen buPROPion (WELLBUTRIN XL) 150 MG 24 hr tablet Take 1 tablet by mouth daily.    . Calcium Carbonate 1500 (600 CA) MG TABS Take 1,500 mg by mouth daily with breakfast.    . Cholecalciferol (VITAMIN D) 2000 UNITS tablet Take 2,000 Units by mouth daily with breakfast.     . cloNIDine (CATAPRES) 0.2 MG tablet Take 0.2 mg by mouth 2 (two) times daily.    . clopidogrel (PLAVIX) 75 MG tablet TAKE ONE (1) TABLET BY MOUTH EVERY DAY 90 tablet 3  . denosumab (PROLIA) 60 MG/ML SOLN injection Inject 60 mg into the skin every 6 (six) months. Administer in upper arm, thigh, or abdomen    . DULoxetine (CYMBALTA) 30 MG capsule Take 1 capsule (30 mg total) by mouth 2 (two) times daily. 180 capsule 2  . ezetimibe (ZETIA) 10 MG tablet TAKE ONE (1) TABLET EACH DAY 90 tablet 3  . Fluticasone-Salmeterol (ADVAIR DISKUS) 250-50 MCG/DOSE AEPB Inhale 1 puff into the lungs 2 (two) times daily.     Marland Kitchen gabapentin (NEURONTIN) 600 MG tablet Take 600-1,200 mg by mouth 3 (three) times daily. 600mg  in the morning and at noon. 1200 mg at bedtime.    Marland Kitchen HUMALOG KWIKPEN 100 UNIT/ML SOPN Inject 14-22 Units into the skin 4 (four) times daily. 10 units at every meal and 14 units at bedtime if needed    . HYDROcodone-acetaminophen (NORCO/VICODIN) 5-325 MG per tablet Take 1 tablet by mouth at bedtime as needed for moderate pain.     Marland Kitchen ipratropium-albuterol (DUONEB) 0.5-2.5 (3) MG/3ML SOLN Take 3 mLs by nebulization as needed (If severe asthma , use every 4 hours for 10 days).     Marland Kitchen LANTUS SOLOSTAR 100 UNIT/ML Solostar Pen Inject 54 Units into the skin every morning. Can increase or decrease by 4 units depending on blood sugar levels    . loperamide (IMODIUM) 2 MG capsule Take 2 mg by mouth as needed for diarrhea or loose stools.    Marland Kitchen losartan (COZAAR) 100 MG tablet Take 100 mg by mouth daily with breakfast.     . magnesium oxide (MAG-OX) 400 MG tablet Take 400 mg by mouth 2  (two) times daily.    . meclizine (ANTIVERT) 12.5 MG tablet Take 12.5 mg by mouth 3 (three) times daily as needed for dizziness.    . methocarbamol (ROBAXIN) 500 MG tablet TAKE ONE (1) TABLET THREE (3) TIMES EACH DAY 60 tablet 4  .  metolazone (ZAROXOLYN) 2.5 MG tablet Take 1 tablet (2.5 mg total) by mouth once a week. Take 1 Tablet once weekly on Mondays. 6 tablet 3  . metoprolol succinate (TOPROL-XL) 25 MG 24 hr tablet TAKE ONE TABLET TWICE DAILY 180 tablet 3  . Multiple Vitamin (MULTIVITAMIN) capsule Take 1 capsule by mouth daily.    . multivitamin-lutein (OCUVITE-LUTEIN) CAPS capsule Take 1 capsule by mouth daily.    . nitroGLYCERIN (NITROSTAT) 0.3 MG SL tablet Place 1 tablet (0.3 mg total) under the tongue every 5 (five) minutes x 3 doses as needed for chest pain. 90 tablet 1  . ondansetron (ZOFRAN) 4 MG tablet Take 1 tablet (4 mg total) by mouth 2 (two) times daily as needed. For nausea (Patient taking differently: Take 4 mg by mouth 2 (two) times daily. For nausea) 20 tablet 0  . OXYGEN Inhale 2 mLs into the lungs.     . pantoprazole (PROTONIX) 40 MG tablet Take 1 tablet (40 mg total) by mouth 2 (two) times daily. 180 tablet 0  . RANEXA 1000 MG SR tablet TAKE ONE (1) TABLET BY MOUTH TWO (2) TIMES DAILY 180 each 2  . rosuvastatin (CRESTOR) 40 MG tablet TAKE ONE (1) TABLET EACH DAY 90 tablet 6  . torsemide (DEMADEX) 20 MG tablet Take 4 tablets (80 mg total) by mouth 2 (two) times daily. 240 tablet 3  . triamcinolone cream (KENALOG) 0.1 % Apply 1 application topically 2 (two) times daily.    Marland Kitchen triamterene-hydrochlorothiazide (DYAZIDE) 37.5-25 MG capsule Take 1 capsule by mouth daily.    . vitamin B-12 (CYANOCOBALAMIN) 1000 MCG tablet Take 1,000 mcg by mouth daily.    Marland Kitchen zinc gluconate 50 MG tablet Take 50 mg by mouth at bedtime.     Marland Kitchen zolpidem (AMBIEN) 10 MG tablet Take 10 mg by mouth at bedtime.      No current facility-administered medications for this encounter.     BP 126/80 (BP  Location: Right Arm, Patient Position: Sitting, Cuff Size: Large)   Pulse (!) 55   Wt 292 lb (132.5 kg)   SpO2 92%   BMI 53.41 kg/m    Wt Readings from Last 3 Encounters:  06/21/17 292 lb (132.5 kg)  06/07/17 (!) 303 lb 9.6 oz (137.7 kg)  05/23/17 (!) 301 lb (136.5 kg)    PHYSICAL EXAM General: Obese. Well appearing. No resp difficulty. HEENT: Normal Neck: Supple. JVP 5-6. Carotids 2+ bilat; no bruits. No thyromegaly or nodule noted. Cor: PMI nondisplaced. RRR, 1/6 SEM RUSB. Rash on chest Lungs: CTAB, normal effort. Abdomen: Soft, non-tender, non-distended, no HSM. No bruits or masses. +BS  Extremities: No cyanosis, clubbing, or rash. R and LLE trace ankle edema Neuro: Alert & orientedx3, cranial nerves grossly intact. moves all 4 extremities w/o difficulty. Affect pleasant   Assessment/Plan: 1. CAD: Occluded native RCA and SVG-RCA.  Now status post opening of CTO RCA with 4 overlapping Xience DES in 5/15.  Most recent cath in 6/18 showed patent RCA stents, no interventional target. Mild chronic stable angina. - Continue ASA, statin, losartan, Crestor, amlodipine, Toprol XL, and ranolazine.  - She will continue Plavix long-term.   - She has not tolerated Imdur in the past. 2. Chronic diastolic CHF:  Echo in 0/24 with EF 55-60%, moderate diastolic dysfunction.  - Chronic NYHA IIIb - Continue torsemide 80 mg BID. Check BMET + mag today - Hold off on spiro with borderline high K 3. Hyperlipidemia: She remains on Zetia, Crestor and Praluent.  Good lipids  in 11/18.  No change.  4. Hypertension: Stable today. Continue current regimen 5. Cerebrovascular disease: She has history of CVA and had left carotid stent in 2/15. - Followed by [facility]. Saw them last month and had repeat carotid US.  6. Palpitations: Improved on higher Toprol XL.  Event monitor in 2/18 with few PVCs. Denies palpitations 7. OHS/OSA: Cannot tolerate CPAP.  Uses oxygen with exertion and at night. Encouraged her to  follow up with [facility] for home oxygen oximyzer 8. Depression - Encouraged her to follow up with her PCP for further management  Labs today Follow up in 3 months  Georgiana Shore 06/21/2017   Greater than 50% of the 25 minute visit was spent in counseling/coordination of care regarding disease state education, salt/fluid restriction, sliding scale diuretics, and medication compliance.

## 2017-06-21 NOTE — Patient Instructions (Signed)
Routine lab work today. Will notify you of abnormal results, otherwise no news is good news!  Follow up 3 months.  __________________________________________________________ [name] [name] Code:  Take all medication as prescribed the day of your appointment. Bring all medications with you to your appointment.  Do the following things EVERYDAY: 1) Weigh yourself in the morning before breakfast. Write it down and keep it in a log. 2) Take your medicines as prescribed 3) Eat low salt foods-Limit salt (sodium) to 2000 mg per day.  4) Stay as active as you can everyday 5) Limit all fluids for the day to less than 2 liters

## 2017-06-22 ENCOUNTER — Telehealth (HOSPITAL_COMMUNITY): Payer: Self-pay | Admitting: Cardiology

## 2017-06-22 MED ORDER — TORSEMIDE 20 MG PO TABS: ORAL_TABLET | ORAL | 3 refills | Status: DC

## 2017-06-22 NOTE — Telephone Encounter (Signed)
-----   Message from Georgiana Shore, NP sent at 06/21/2017  1:55 PM EDT ----- Creatinine is up a little bit. Please have her decrease torsemide to 80 mg am, 40 mg pm.

## 2017-07-06 ENCOUNTER — Ambulatory Visit: Payer: Medicare Other | Admitting: Adult Health

## 2017-07-07 ENCOUNTER — Ambulatory Visit (INDEPENDENT_AMBULATORY_CARE_PROVIDER_SITE_OTHER): Payer: Medicare Other | Admitting: Orthotics

## 2017-07-07 ENCOUNTER — Ambulatory Visit (INDEPENDENT_AMBULATORY_CARE_PROVIDER_SITE_OTHER): Payer: Medicare Other | Admitting: Adult Health

## 2017-07-07 ENCOUNTER — Encounter: Payer: Self-pay | Admitting: Adult Health

## 2017-07-07 ENCOUNTER — Ambulatory Visit (INDEPENDENT_AMBULATORY_CARE_PROVIDER_SITE_OTHER)
Admission: RE | Admit: 2017-07-07 | Discharge: 2017-07-07 | Disposition: A | Discharge status: Home / Self Care | Payer: Medicare Other | Source: Ambulatory Visit | Attending: Adult Health | Admitting: Adult Health

## 2017-07-07 VITALS — BP 110/66 | HR 58 | Ht 62.5 in | Wt 299.4 lb

## 2017-07-07 DIAGNOSIS — J4530 Mild persistent asthma, uncomplicated: Secondary | ICD-10-CM

## 2017-07-07 DIAGNOSIS — M2011 Hallux valgus (acquired), right foot: Secondary | ICD-10-CM

## 2017-07-07 DIAGNOSIS — I6529 Occlusion and stenosis of unspecified carotid artery: Secondary | ICD-10-CM | POA: Diagnosis not present

## 2017-07-07 DIAGNOSIS — G629 Polyneuropathy, unspecified: Secondary | ICD-10-CM | POA: Diagnosis not present

## 2017-07-07 DIAGNOSIS — I5032 Chronic diastolic (congestive) heart failure: Secondary | ICD-10-CM | POA: Diagnosis not present

## 2017-07-07 DIAGNOSIS — G4734 Idiopathic sleep related nonobstructive alveolar hypoventilation: Secondary | ICD-10-CM | POA: Diagnosis not present

## 2017-07-07 DIAGNOSIS — E1149 Type 2 diabetes mellitus with other diabetic neurological complication: Secondary | ICD-10-CM

## 2017-07-07 MED ORDER — MONTELUKAST SODIUM 10 MG PO TABS: 10.0000 mg | ORAL_TABLET | Freq: Every day | ORAL | 11 refills | Status: DC

## 2017-07-07 NOTE — Progress Notes (Signed)

## 2017-07-07 NOTE — Patient Instructions (Addendum)
Add Zyrtec 10mg  At bedtime   Saline nasal spray Laretta Alstrom Twice daily  .  Chest xray today .  Try Singulair 10mg  At bedtime   Follow up with Dr. Elsworth Soho  In 2 months and As needed   Please contact office for sooner follow up if symptoms do not improve or worsen or seek emergency care

## 2017-07-07 NOTE — Assessment & Plan Note (Signed)
Mild flare with allergic rhinitis  Hold on steroids for now as has DM-Insulin depend    Plan  Patient Instructions  Add Zyrtec 10mg  At bedtime   Saline nasal spray /rinses Twice daily  .  Chest xray today .  Try Singulair 10mg  At bedtime   Follow up with Dr. Elsworth Soho  In 2 months and As needed   Please contact office for sooner follow up if symptoms do not improve or worsen or seek emergency care

## 2017-07-07 NOTE — Assessment & Plan Note (Signed)
Chronic D CHF =appears euvolemic  Cont follow up with cardiology  Check cxr today

## 2017-07-07 NOTE — Progress Notes (Signed)
@Patient  ID: [name] [name], female    DOB: Feb 17, 1945, 72 y.o.   [name]: [id number]  Chief Complaint  Patient presents with  . Follow-up    Asthma     Referring provider: Derinda Late, MD  HPI: 72 year old female never smoker followed for asthma and nocturnal hypoxemia on oxygen at bedtime Past medical history significant for ischemic cardiomyopathy and chronic diastolic heart failure, CVA Previous sleep study in 2006 showed moderate sleep apnea with CPAP intolerance.  A repeat sleep study and April 2018 was negative for sleep apnea but nocturnal hypoxemia started on oxygen 2 L.   Significant tests/ events NPSG 2006: AHI 19/hr. PSG 05/28/16 no OSA  Echo 06/2015- EF 55-60%, RVSP 61 mm Right heart cath 05/2015 PCWP 9  Spirometry 06/2016 moderate restriction, ratio 83, FEV1 53%, FVC 49%  07/07/2017 Follow up : Asthma and O2 RF At bedtime   Patient presents for a six-month follow-up for asthma.  She remains on Advair twice daily. Has noticed that asthma has not been doing as well for last 4 weeks . Has increased albuterol use.  Has been having intermittent dry cough and wheezing . More allergy symptoms with nasal drainage and sinus congestion .  Denies fever, discolored mucus, chest pain.  Has chronic leg swelling.  Patient has nocturnal hypoxemia.  She is on oxygen 2 L at bedtime.  Has DM -insulin depnendent   Allergies  Allergen Reactions  . Amoxicillin Shortness Of Breath and Rash  . Brilinta [Ticagrelor] Shortness Of Breath  . Erythromycin Shortness Of Breath, Other (See Comments) and Hives    Trouble swallowing  . Flagyl [Metronidazole] Palpitations and Shortness Of Breath  . Penicillins Shortness Of Breath, Rash and Hives  . Isosorbide Mononitrate [Isosorbide Nitrate] Other (See Comments)    Joint aches, muscles hurt, difficult to walk   . Jardiance [Empagliflozin] Other (See Comments)    Nausea, joint aches, muscles aches  . Metformin And Related Other (See  Comments)    Stomach pain, cold sweats, joint pain, burred vision, dizziness  . Tape Other (See Comments)    Skin pulls off with certain types Plastic tape causes skin to rip if left on for long periods of time  . Erythromycin Base Rash    Immunization History  Administered Date(s) Administered  . DTaP 03/27/2014  . Influenza Split 12/07/2011  . Influenza Whole 01/02/2000  . Influenza, High Dose Seasonal PF 12/02/2016  . Influenza-Unspecified 12/02/2012, 12/07/2014, 12/02/2015  . Pneumococcal Conjugate-13 02/07/2012, 01/01/2013  . Tdap 12/30/2010, 03/27/2014  . Zoster 07/23/2010    Past Medical History:  Diagnosis Date  . Abnormality of gait 05/14/2014  . Anemia    hx  . Anginal pain ([place])   . Anxiety   . Asthma   . Bundle branch block, left    chronic/notes 07/18/2013  . Cancer [facility]) April 2016   BCC,  Melanoma  . CHF (congestive heart failure) ([place])   . Chronic bronchitis ([place])    "off and on all the time" (07/18/2013)  . Chronic insomnia 05/06/2015  . Chronic kidney disease    frequency, sses dr Jamal Maes every 4 to 6 months  . Chronic low back pain 08/24/2016  . Chronic lower back pain   . Claustrophobia   . Common migraine 05/14/2014  . Coronary artery disease   . Dysrhythmia   . GERD (gastroesophageal reflux disease)   . H/O hiatal hernia   . Headache    "probably 4 days/wk" (07/18/2013)  . Heart murmur   .  Hyperlipidemia   . Hypertension   . Irregular heart beat   . Leg cramps    both legs at times  . Memory change 05/14/2014  . Mental disorder    hx of confusion  . Migraine    "used to have them really bad; kind of eased up now; down to 1 q couple months" (07/18/2013)  . Myocardial infarction ([place])    04/1999, 02/2000, 01/2005; 2011; 2014  . Neuromuscular disorder ([place])    ?  . Obesity 01-2010  . Obstructive sleep apnea    "can't wear machine; I have claustrophobia" (07/18/2013)  . Osteoarthritis    "knees and hands" (07/18/2013)  . Other and  unspecified angina pectoris   . Peripheral vascular disease ([facility])    ? numbness, tingling arms and legs  . Pneumonia 2000's   "once"  . PONV (postoperative nausea and vomiting)   . Shortness of breath    with exertion  . Stroke ([place]) 03/22/12   right side brain; denies residual on 07/18/2013)  . Stroke [facility]) Oct. 2015   Affected pt.s balance  . Stroke ([place]) 10/2014  . Type II diabetes mellitus ([place])   . Ventral hernia    hx of  . Vomiting    persistent  . Vomiting blood     Tobacco History: Social History   Tobacco Use  Smoking Status Never Smoker  Smokeless Tobacco Never Used   Counseling given: Not Answered   Outpatient Encounter Medications as of 07/07/2017  Medication Sig  . acetaminophen (TYLENOL) 325 MG tablet Take 650 mg by mouth every 6 (six) hours as needed for mild pain.   Marland Kitchen albuterol (PROVENTIL) (2.5 MG/3ML) 0.083% nebulizer solution Take 2.5 mg by nebulization every 6 (six) hours as needed for shortness of breath (when asthma is active).   Marland Kitchen albuterol (PROVENTIL,VENTOLIN) 90 MCG/ACT inhaler Inhale 2 puffs into the lungs every 6 (six) hours as needed for shortness of breath.   . Alirocumab (PRALUENT) 150 MG/ML SOPN Inject 1 pen into the skin every 14 (fourteen) days.  . ALPRAZolam (XANAX) 0.5 MG tablet Take 0.5-1 mg by mouth 3 (three) times daily. 0.5mg  in the morning and at lunch, 1mg  at bedtime  . amLODipine (NORVASC) 10 MG tablet Take 10 mg by mouth at bedtime.   Marland Kitchen aspirin EC 81 MG tablet Take 162 mg by mouth daily.  Marland Kitchen b complex vitamins capsule Take 1 capsule by mouth daily.  Marland Kitchen buPROPion (WELLBUTRIN XL) 150 MG 24 hr tablet Take 1 tablet by mouth daily.  . Calcium Carbonate 1500 (600 CA) MG TABS Take 1,500 mg by mouth daily with breakfast.  . Cholecalciferol (VITAMIN D) 2000 UNITS tablet Take 2,000 Units by mouth daily with breakfast.   . cloNIDine (CATAPRES) 0.2 MG tablet Take 0.2 mg by mouth 2 (two) times daily.  . clopidogrel (PLAVIX) 75 MG tablet TAKE ONE  (1) TABLET BY MOUTH EVERY DAY  . denosumab (PROLIA) 60 MG/ML SOLN injection Inject 60 mg into the skin every 6 (six) months. Administer in upper arm, thigh, or abdomen  . DULoxetine (CYMBALTA) 30 MG capsule Take 1 capsule (30 mg total) by mouth 2 (two) times daily.  Marland Kitchen ezetimibe (ZETIA) 10 MG tablet TAKE ONE (1) TABLET EACH DAY  . Fluticasone-Salmeterol (ADVAIR DISKUS) 250-50 MCG/DOSE AEPB Inhale 1 puff into the lungs 2 (two) times daily.   Marland Kitchen gabapentin (NEURONTIN) 600 MG tablet Take 600-1,200 mg by mouth 3 (three) times daily. 600mg  in the morning and at noon. 1200 mg  at bedtime.  Marland Kitchen HUMALOG KWIKPEN 100 UNIT/ML SOPN Inject 14-22 Units into the skin 4 (four) times daily. 10 units at every meal and 14 units at bedtime if needed  . HYDROcodone-acetaminophen (NORCO/VICODIN) 5-325 MG per tablet Take 1 tablet by mouth at bedtime as needed for moderate pain.   Marland Kitchen ipratropium-albuterol (DUONEB) 0.5-2.5 (3) MG/3ML SOLN Take 3 mLs by nebulization as needed (If severe asthma , use every 4 hours for 10 days).   Marland Kitchen LANTUS SOLOSTAR 100 UNIT/ML Solostar Pen Inject 54 Units into the skin every morning. Can increase or decrease by 4 units depending on blood sugar levels  . loperamide (IMODIUM) 2 MG capsule Take 2 mg by mouth as needed for diarrhea or loose stools.  Marland Kitchen losartan (COZAAR) 100 MG tablet Take 100 mg by mouth daily with breakfast.   . magnesium oxide (MAG-OX) 400 MG tablet Take 400 mg by mouth 2 (two) times daily.  . meclizine (ANTIVERT) 12.5 MG tablet Take 12.5 mg by mouth 3 (three) times daily as needed for dizziness.  . methocarbamol (ROBAXIN) 500 MG tablet TAKE ONE (1) TABLET THREE (3) TIMES EACH DAY  . metolazone (ZAROXOLYN) 2.5 MG tablet Take 1 tablet (2.5 mg total) by mouth once a week. Take 1 Tablet once weekly on Mondays.  . metoprolol succinate (TOPROL-XL) 25 MG 24 hr tablet TAKE ONE TABLET TWICE DAILY  . Multiple Vitamin (MULTIVITAMIN) capsule Take 1 capsule by mouth daily.  . multivitamin-lutein  (OCUVITE-LUTEIN) CAPS capsule Take 1 capsule by mouth daily.  . nitroGLYCERIN (NITROSTAT) 0.3 MG SL tablet Place 1 tablet (0.3 mg total) under the tongue every 5 (five) minutes x 3 doses as needed for chest pain.  Marland Kitchen ondansetron (ZOFRAN) 4 MG tablet Take 1 tablet (4 mg total) by mouth 2 (two) times daily as needed. For nausea (Patient taking differently: Take 4 mg by mouth 2 (two) times daily. For nausea)  . OXYGEN Inhale 2 mLs into the lungs.   . pantoprazole (PROTONIX) 40 MG tablet Take 1 tablet (40 mg total) by mouth 2 (two) times daily.  Marland Kitchen RANEXA 1000 MG SR tablet TAKE ONE (1) TABLET BY MOUTH TWO (2) TIMES DAILY  . rosuvastatin (CRESTOR) 40 MG tablet TAKE ONE (1) TABLET EACH DAY  . torsemide (DEMADEX) 20 MG tablet Take 4 tablets (80 mg total) by mouth every morning AND 2 tablets (40 mg total) every evening.  . triamcinolone cream (KENALOG) 0.1 % Apply 1 application topically 2 (two) times daily.  Marland Kitchen triamterene-hydrochlorothiazide (DYAZIDE) 37.5-25 MG capsule Take 1 capsule by mouth daily.  . vitamin B-12 (CYANOCOBALAMIN) 1000 MCG tablet Take 1,000 mcg by mouth daily.  Marland Kitchen zinc gluconate 50 MG tablet Take 50 mg by mouth at bedtime.   Marland Kitchen zolpidem (AMBIEN) 10 MG tablet Take 10 mg by mouth at bedtime.   . montelukast (SINGULAIR) 10 MG tablet Take 1 tablet (10 mg total) by mouth at bedtime.   No facility-administered encounter medications on file as of 07/07/2017.      Review of Systems  Constitutional:   No  weight loss, night sweats,  Fevers, chills, fatigue, or  lassitude.  HEENT:   No headaches,  Difficulty swallowing,  Tooth/dental problems, or  Sore throat,                No sneezing, itching, ear ache,  +nasal congestion, post nasal drip,   CV:  No chest pain,  Orthopnea, PND, swelling in lower extremities, anasarca, dizziness, palpitations, syncope.   GI  No  heartburn, indigestion, abdominal pain, nausea, vomiting, diarrhea, change in bowel habits, loss of appetite, bloody stools.    Resp:   No chest wall deformity  Skin: no rash or lesions.  GU: no dysuria, change in color of urine, no urgency or frequency.  No flank pain, no hematuria   MS:  No joint pain or swelling.  No decreased range of motion.  No back pain.    Physical Exam  BP 110/66 (BP Location: Left Arm, Cuff Size: Large)   Pulse (!) 58   Ht 5' 2.5" (1.588 m)   Wt 299 lb 6.4 oz (135.8 kg)   SpO2 95%   BMI 53.89 kg/m   GEN: A/Ox3; pleasant , NAD, obese    HEENT:  [place]/AT,  EACs-clear, TMs-wnl, NOSE-clear drainage  THROAT-clear, no lesions, no postnasal drip or exudate noted.   NECK:  Supple w/ fair ROM; no JVD; normal carotid impulses w/o bruits; no thyromegaly or nodules palpated; no lymphadenopathy.    RESP  Clear  P & A; w/o, wheezes/ rales/ or rhonchi. no accessory muscle use, no dullness to percussion  CARD:  RRR, no m/r/g, 1+ peripheral edema, pulses intact, no cyanosis or clubbing.  GI:   Soft & nt; nml bowel sounds; no organomegaly or masses detected.   Musco: Warm bil, no deformities or joint swelling noted.   Neuro: alert, no focal deficits noted.    Skin: Warm, no lesions or rashes    Lab Results:  CBC  BMET   BNP  Imaging: No results found.   Assessment & Plan:   Asthma Mild flare with allergic rhinitis  Hold on steroids for now as has DM-Insulin depend    Plan  Patient Instructions  Add Zyrtec 10mg  At bedtime   Saline nasal spray /rinses Twice daily  .  Chest xray today .  Try Singulair 10mg  At bedtime   Follow up with Dr. Elsworth Soho  In 2 months and As needed   Please contact office for sooner follow up if symptoms do not improve or worsen or seek emergency care       Chronic diastolic CHF (congestive heart failure) ([place]) Chronic D CHF =appears euvolemic  Cont follow up with cardiology  Check cxr today   Nocturnal hypoxia Cont on O2 At bedtime       Rexene Edison, NP 07/07/2017

## 2017-07-07 NOTE — Assessment & Plan Note (Signed)
Cont on O2 At bedtime   

## 2017-07-11 ENCOUNTER — Telehealth: Payer: Self-pay | Admitting: Pulmonary Disease

## 2017-07-11 NOTE — Telephone Encounter (Signed)
Returned Patient call for CXR results.  Results and recommendations given. Patient stated understanding.  Nothing further at this time.

## 2017-07-15 ENCOUNTER — Encounter

## 2017-07-15 ENCOUNTER — Encounter: Payer: Self-pay | Admitting: Neurology

## 2017-07-15 ENCOUNTER — Other Ambulatory Visit: Payer: Self-pay

## 2017-07-15 ENCOUNTER — Ambulatory Visit (INDEPENDENT_AMBULATORY_CARE_PROVIDER_SITE_OTHER): Payer: Medicare Other | Admitting: Neurology

## 2017-07-15 VITALS — BP 164/68 | HR 70 | Ht 62.5 in | Wt 298.0 lb

## 2017-07-15 DIAGNOSIS — I6529 Occlusion and stenosis of unspecified carotid artery: Secondary | ICD-10-CM

## 2017-07-15 DIAGNOSIS — R269 Unspecified abnormalities of gait and mobility: Secondary | ICD-10-CM | POA: Diagnosis not present

## 2017-07-15 DIAGNOSIS — I679 Cerebrovascular disease, unspecified: Secondary | ICD-10-CM | POA: Diagnosis not present

## 2017-07-15 DIAGNOSIS — M545 Low back pain, unspecified: Secondary | ICD-10-CM

## 2017-07-15 DIAGNOSIS — G8929 Other chronic pain: Secondary | ICD-10-CM

## 2017-07-15 MED ORDER — DULOXETINE HCL 30 MG PO CPEP: 30.0000 mg | ORAL_CAPSULE | Freq: Two times a day (BID) | ORAL | 3 refills | Status: DC

## 2017-07-15 NOTE — Progress Notes (Signed)
Reason for visit: Chronic low back pain, memory disturbance, gait disturbance  [name] [name] is an 72 y.o. female  History of present illness:  [name] [name] is a 72 year old right-handed white female with a history of morbid obesity, she has degenerative changes in the low back at the L4-5 level with some chronic back pain and some pain down the right leg to the knee that is worse with weightbearing.  The patient has gait instability, she has not had any falls since last seen but she refuses to use a cane or a walker.  The patient reports increasing problems with fatigue.  She is now on oxygen at night, she is followed through pulmonary.  She sleeps with her head elevated.  She takes 10 mg of Ambien, 1 mg of Xanax, and 1200 mg of gabapentin at night and is able to get to sleep for about an hour and then wakes up and cannot get back to sleep.  She is exhausted during the day, she can hardly stay awake, she does not feel like doing anything because of fatigue.  The patient reports that with the fatigue her memory has worsened, she cannot focus on anything.  The patient returns to the office today for an evaluation.  She does have a history of significant heart disease and cerebrovascular disease.  Past Medical History:  Diagnosis Date  . Abnormality of gait 05/14/2014  . Anemia    hx  . Anginal pain ([place])   . Anxiety   . Asthma   . Bundle branch block, left    chronic/notes 07/18/2013  . Cancer [facility]) April 2016   BCC,  Melanoma  . CHF (congestive heart failure) ([place])   . Chronic bronchitis ([place])    "off and on all the time" (07/18/2013)  . Chronic insomnia 05/06/2015  . Chronic kidney disease    frequency, sses dr Jamal Maes every 4 to 6 months  . Chronic low back pain 08/24/2016  . Chronic lower back pain   . Claustrophobia   . Common migraine 05/14/2014  . Coronary artery disease   . Dysrhythmia   . GERD (gastroesophageal reflux disease)   . H/O hiatal hernia   . Headache    "probably 4 days/wk" (07/18/2013)  . Heart murmur   . Hyperlipidemia   . Hypertension   . Irregular heart beat   . Leg cramps    both legs at times  . Memory change 05/14/2014  . Mental disorder    hx of confusion  . Migraine    "used to have them really bad; kind of eased up now; down to 1 q couple months" (07/18/2013)  . Myocardial infarction ([place])    04/1999, 02/2000, 01/2005; 2011; 2014  . Neuromuscular disorder ([place])    ?  . Obesity 01-2010  . Obstructive sleep apnea    "can't wear machine; I have claustrophobia" (07/18/2013)  . Osteoarthritis    "knees and hands" (07/18/2013)  . Other and unspecified angina pectoris   . Peripheral vascular disease (H[facility])    ? numbness, tingling arms and legs  . Pneumonia 2000's   "once"  . PONV (postoperative nausea and vomiting)   . Shortness of breath    with exertion  . Stroke ([place]) 03/22/12   right side brain; denies residual on 07/18/2013)  . Stroke [facility]) Oct. 2015   Affected pt.s balance  . Stroke ([place]) 10/2014  . Type II diabetes mellitus ([place])   . Ventral hernia  hx of  . Vomiting    persistent  . Vomiting blood     Past Surgical History:  Procedure Laterality Date  . ANKLE FRACTURE SURGERY Left 1970's  . APPENDECTOMY  1970's   w/hysterectomy  . CARDIAC CATHETERIZATION  10/10/2012   Dr [name].  Marland Kitchen CARDIAC CATHETERIZATION N/A 05/29/2015   Procedure: Right/Left Heart Cath and Coronary/Graft Angiography;  Surgeon: Larey Dresser, MD;  Location: [facility];  Service: Cardiovascular;  Laterality: N/A;  . CAROTID ENDARTERECTOMY Left 03/2013  . CAROTID STENT INSERTION Left 03/20/2013   Procedure: CAROTID STENT INSERTION;  Surgeon: [name], MD;  Location: [facility] CATH LAB;  Service: Cardiovascular;  Laterality: Left;  internal carotid  . CEREBRAL ANGIOGRAM N/A 04/05/2011   Procedure: CEREBRAL ANGIOGRAM;  Surgeon: Angelia Mould, MD;  Location: [facility] CATH LAB;  Service: Cardiovascular;  Laterality: N/A;  . CHOLECYSTECTOMY   2004  . CORONARY ANGIOPLASTY WITH STENT PLACEMENT  01,02,05,06,07,08,11; 04/24/2013   "I've probably got ~ 10 stents by now" (04/24/2013)  . CORONARY ANGIOPLASTY WITH STENT PLACEMENT  06/13/2013   "got 4 stents today" (06/13/2013)  . CORONARY ARTERY BYPASS GRAFT  1220/11   "CABG X5"  . ESOPHAGOGASTRODUODENOSCOPY  08/03/2011   Procedure: ESOPHAGOGASTRODUODENOSCOPY (EGD);  Surgeon: Shann Medal, MD;  Location: [facility];  Service: General;  Laterality: N/A;  . ESOPHAGOGASTRODUODENOSCOPY (EGD) WITH PROPOFOL N/A 03/11/2014   Procedure: ESOPHAGOGASTRODUODENOSCOPY (EGD) WITH PROPOFOL;  Surgeon: Lafayette Dragon, MD;  Location: [facility] ENDOSCOPY;  Service: Endoscopy;  Laterality: N/A;  . FRACTURE SURGERY    . gall stone removal  05/2003  . GASTRIC BYPASS  1984   "stapeling"  . HERNIA REPAIR  2004   "in my stomach; had OR on it twice", wire mesh on 1 hernia  . LEFT HEART CATH AND CORS/GRAFTS ANGIOGRAPHY N/A 07/09/2016   Procedure: LEFT HEART CATH AND CORS/GRAFTS ANGIOGRAPHY;  Surgeon: Larey Dresser, MD;  Location: [facility];  Service: Cardiovascular;  Laterality: N/A;  . PERCUTANEOUS CORONARY STENT INTERVENTION (PCI-S) N/A 06/13/2013   Procedure: PERCUTANEOUS CORONARY STENT INTERVENTION (PCI-S);  Surgeon: [name], MD;  Location: [facility] CATH LAB;  Service: Cardiovascular;  Laterality: N/A;  . PERCUTANEOUS STENT INTERVENTION N/A 04/24/2013   Procedure: PERCUTANEOUS STENT INTERVENTION;  Surgeon: [name], MD;  Location: [facility] CATH LAB;  Service: Cardiovascular;  Laterality: N/A;  . ROOT CANAL  10/2000  . TIBIA FRACTURE SURGERY Right 1970's   rods and pins  . TOOTH EXTRACTION    . TOTAL ABDOMINAL HYSTERECTOMY  1970's   w/ appendectomy  . TUMOR REMOVAL  1970's   ovarian    Family History  Problem Relation Age of Onset  . Heart disease Father        Heart Disease before age 68  . Diabetes Father   . Hyperlipidemia Father   . Hypertension Father   . Heart attack Father   . Deep  vein thrombosis Father   . AAA (abdominal aortic aneurysm) Father   . Hypertension Mother   . Deep vein thrombosis Mother   . AAA (abdominal aortic aneurysm) Mother   . Cancer Sister        Ovarian  . Hypertension Sister   . Diabetes Paternal Grandmother   . Heart disease Paternal Uncle   . Diabetes Paternal Uncle   . Heart attack Paternal Grandfather 12       died of MI at 34  . Diabetes Paternal Aunt   . Diabetes Paternal Uncle   .  Colon cancer Neg Hx     Social history:  reports that she has never smoked. She has never used smokeless tobacco. She reports that she does not drink alcohol or use drugs.    Allergies  Allergen Reactions  . Amoxicillin Shortness Of Breath and Rash  . Brilinta [Ticagrelor] Shortness Of Breath  . Erythromycin Shortness Of Breath, Other (See Comments) and Hives    Trouble swallowing  . Flagyl [Metronidazole] Palpitations and Shortness Of Breath  . Penicillins Shortness Of Breath, Rash and Hives  . Isosorbide Mononitrate [Isosorbide Nitrate] Other (See Comments)    Joint aches, muscles hurt, difficult to walk   . Jardiance [Empagliflozin] Other (See Comments)    Nausea, joint aches, muscles aches  . Metformin And Related Other (See Comments)    Stomach pain, cold sweats, joint pain, burred vision, dizziness  . Tape Other (See Comments)    Skin pulls off with certain types Plastic tape causes skin to rip if left on for long periods of time  . Erythromycin Base Rash    Medications:  Prior to Admission medications   Medication Sig Start Date End Date Taking? Authorizing Provider  acetaminophen (TYLENOL) 325 MG tablet Take 650 mg by mouth every 6 (six) hours as needed for mild pain.     [provider]  albuterol (PROVENTIL) (2.5 MG/3ML) 0.083% nebulizer solution Take 2.5 mg by nebulization every 6 (six) hours as needed for shortness of breath (when asthma is active).     [provider]  albuterol (PROVENTIL,VENTOLIN) 90 MCG/ACT  inhaler Inhale 2 puffs into the lungs every 6 (six) hours as needed for shortness of breath.     [provider]  Alirocumab (PRALUENT) 150 MG/ML SOPN Inject 1 pen into the skin every 14 (fourteen) days. 04/07/16   Larey Dresser, MD  ALPRAZolam Duanne Moron) 0.5 MG tablet Take 0.5-1 mg by mouth 3 (three) times daily. 0.5mg  in the morning and at lunch, 1mg  at bedtime    [provider]  amLODipine (NORVASC) 10 MG tablet Take 10 mg by mouth at bedtime.  01/20/15   [provider]  aspirin EC 81 MG tablet Take 162 mg by mouth daily.    [provider]  b complex vitamins capsule Take 1 capsule by mouth daily.    [provider]  buPROPion (WELLBUTRIN XL) 150 MG 24 hr tablet Take 1 tablet by mouth daily. 04/09/16   [provider]  Calcium Carbonate 1500 (600 CA) MG TABS Take 1,500 mg by mouth daily with breakfast.    [provider]  Cholecalciferol (VITAMIN D) 2000 UNITS tablet Take 2,000 Units by mouth daily with breakfast.     [provider]  cloNIDine (CATAPRES) 0.2 MG tablet Take 0.2 mg by mouth 2 (two) times daily.    [provider]  clopidogrel (PLAVIX) 75 MG tablet TAKE ONE (1) TABLET BY MOUTH EVERY DAY 07/16/16   Bensimhon, Shaune Pascal, MD  denosumab (PROLIA) 60 MG/ML SOLN injection Inject 60 mg into the skin every 6 (six) months. Administer in upper arm, thigh, or abdomen    [provider]  DULoxetine (CYMBALTA) 30 MG capsule Take 1 capsule (30 mg total) by mouth 2 (two) times daily. 08/24/16   Kathrynn Ducking, MD  ezetimibe (ZETIA) 10 MG tablet TAKE ONE (1) TABLET EACH DAY 07/16/16   Bensimhon, Shaune Pascal, MD  Fluticasone-Salmeterol (ADVAIR DISKUS) 250-50 MCG/DOSE AEPB Inhale 1 puff into the lungs 2 (two) times daily.  [provider]  gabapentin (NEURONTIN) 600 MG tablet Take 600-1,200 mg by mouth 3 (three) times daily. 600mg  in the morning and at noon. 1200 mg at bedtime.    [provider]    HUMALOG KWIKPEN 100 UNIT/ML SOPN Inject 14-22 Units into the skin 4 (four) times daily. 10 units at every meal and 14 units at bedtime if needed 09/19/12   [provider]  HYDROcodone-acetaminophen (NORCO/VICODIN) 5-325 MG per tablet Take 1 tablet by mouth at bedtime as needed for moderate pain.     [provider]  ipratropium-albuterol (DUONEB) 0.5-2.5 (3) MG/3ML SOLN Take 3 mLs by nebulization as needed (If severe asthma , use every 4 hours for 10 days).     [provider]  LANTUS SOLOSTAR 100 UNIT/ML Solostar Pen Inject 54 Units into the skin every morning. Can increase or decrease by 4 units depending on blood sugar levels 08/23/14   [provider]  loperamide (IMODIUM) 2 MG capsule Take 2 mg by mouth as needed for diarrhea or loose stools.    [provider]  losartan (COZAAR) 100 MG tablet Take 100 mg by mouth daily with breakfast.     [provider]  magnesium oxide (MAG-OX) 400 MG tablet Take 400 mg by mouth 2 (two) times daily.    [provider]  meclizine (ANTIVERT) 12.5 MG tablet Take 12.5 mg by mouth 3 (three) times daily as needed for dizziness.    [provider]  methocarbamol (ROBAXIN) 500 MG tablet TAKE ONE (1) TABLET THREE (3) TIMES EACH DAY 08/24/16   Kathrynn Ducking, MD  metolazone (ZAROXOLYN) 2.5 MG tablet Take 1 tablet (2.5 mg total) by mouth once a week. Take 1 Tablet once weekly on Mondays. 06/07/17   Georgiana Shore, NP  metoprolol succinate (TOPROL-XL) 25 MG 24 hr tablet TAKE ONE TABLET TWICE DAILY 09/12/15   Larey Dresser, MD  montelukast (SINGULAIR) 10 MG tablet Take 1 tablet (10 mg total) by mouth at bedtime. 07/07/17   Parrett, Fonnie Mu, NP  Multiple Vitamin (MULTIVITAMIN) capsule Take 1 capsule by mouth daily.    [provider]  multivitamin-lutein (OCUVITE-LUTEIN) CAPS capsule Take 1 capsule by mouth daily.    [provider]  nitroGLYCERIN (NITROSTAT) 0.3 MG SL tablet Place 1  tablet (0.3 mg total) under the tongue every 5 (five) minutes x 3 doses as needed for chest pain. 08/10/16   Shirley Friar, PA-C  ondansetron (ZOFRAN) 4 MG tablet Take 1 tablet (4 mg total) by mouth 2 (two) times daily as needed. For nausea Patient taking differently: Take 4 mg by mouth 2 (two) times daily. For nausea 02/12/14   Lafayette Dragon, MD  OXYGEN Inhale 2 mLs into the lungs.     [provider]  pantoprazole (PROTONIX) 40 MG tablet Take 1 tablet (40 mg total) by mouth 2 (two) times daily. 04/26/16   Irene Shipper, MD  RANEXA 1000 MG SR tablet TAKE ONE (1) TABLET BY MOUTH TWO (2) TIMES DAILY 12/10/16   Larey Dresser, MD  rosuvastatin (CRESTOR) 40 MG tablet TAKE ONE (1) TABLET EACH DAY 12/10/16   Bensimhon, Shaune Pascal, MD  torsemide (DEMADEX) 20 MG tablet Take 4 tablets (80 mg total) by mouth every morning AND 2 tablets (40 mg total) every evening. 06/22/17   Bensimhon, Shaune Pascal, MD  triamcinolone cream (KENALOG) 0.1 % Apply 1 application topically 2 (two) times daily.    [provider]  triamterene-hydrochlorothiazide Jacklynn Lewis)  37.5-25 MG capsule Take 1 capsule by mouth daily.    [provider]  vitamin B-12 (CYANOCOBALAMIN) 1000 MCG tablet Take 1,000 mcg by mouth daily.    [provider]  zinc gluconate 50 MG tablet Take 50 mg by mouth at bedtime.     [provider]  zolpidem (AMBIEN) 10 MG tablet Take 10 mg by mouth at bedtime.     [provider]    ROS:  Out of a complete 14 system review of symptoms, the patient complains only of the following symptoms, and all other reviewed systems are negative.  Fatigue Back pain Memory disturbance  There were no vitals taken for this visit.  Physical Exam  General: The patient is alert and cooperative at the time of the examination.  The patient is markedly obese.  Skin: No significant peripheral edema is noted.   Neurologic Exam  Mental status: The patient is alert and  oriented x 3 at the time of the examination. The patient has apparent normal recent and remote memory, with an apparently normal attention span and concentration ability.   Cranial nerves: Facial symmetry is present. Speech is normal, no aphasia or dysarthria is noted. Extraocular movements are full. Visual fields are full.  Motor: The patient has good strength in all 4 extremities.  Sensory examination: Soft touch sensation is symmetric on the face, arms, and legs.  Coordination: The patient has good finger-nose-finger and heel-to-shin bilaterally.  Gait and station: The patient has a wide-based, unsteady gait.  Tandem gait was not attempted.  Romberg is negative but is unsteady. No drift is seen.  Reflexes: Deep tendon reflexes are symmetric, but are depressed.   MRI lumbar 03/15/16:  IMPRESSION: 1. Grade 1 spondylolisthesis and moderate spinal stenosis and severe lateral recess compression at L4-5 due to facet arthritis and ligamentum flavum hypertrophy. 2. Moderate bilateral facet arthritis at L3-4. 3. Small central disc protrusion at L1-2 without neural impingement.  * MRI scan images were reviewed online. I agree with the written report.    Assessment/Plan:  1.  Morbid obesity  2.  Gait disturbance  3.  Chronic low back pain  4.  Chronic insomnia  5.  Reports of memory disturbance  The patient in general is extremely unhealthy.  She has significant cerebrovascular disease, coronary artery disease, back pain issues, insomnia, chronic fatigue, gait disturbance, and reported memory problems.  The patient has recently gone on oxygen at night.  I have asked her to contact her pulmonologist about her chronic insomnia, increasing sedative medications at night may promote central apnea and worsen the nocturnal hypoxia.  The patient will be sent for a facet joint injection at the L4-5 level on the right.  Her pain down the right leg to the knee is most consistent with facet  joint arthritis.  The patient was given another prescription for her Cymbalta.  She also takes Robaxin for her back spasms and leg cramps which is effective for her.  She will follow-up through this office in 6 months.  I have indicated that her primary medical problem is her obesity and that weight loss is essential to help treat her multitude of medical complaints.  I have recommended that she get into a structured dietary program such as Weight Watchers.  She will need to continue her physical exercise, she is to translate what she was doing in cardiac rehab to what she will do at her exercise facility.  Jill Alexanders MD 07/15/2017 9:03 AM  [facility] [address] [place], [place] [address]  Phone [phone] Fax [phone]

## 2017-07-18 ENCOUNTER — Other Ambulatory Visit: Payer: Self-pay | Admitting: Neurology

## 2017-07-18 DIAGNOSIS — M545 Low back pain, unspecified: Secondary | ICD-10-CM

## 2017-07-18 DIAGNOSIS — G8929 Other chronic pain: Secondary | ICD-10-CM

## 2017-07-22 ENCOUNTER — Encounter (HOSPITAL_COMMUNITY): Payer: Self-pay

## 2017-07-22 NOTE — Progress Notes (Signed)
Faxed to [phone] okay to hold plavix for 5 days prior per Dr. [name]

## 2017-07-26 ENCOUNTER — Other Ambulatory Visit: Payer: Medicare Other

## 2017-07-27 ENCOUNTER — Other Ambulatory Visit: Payer: Medicare Other

## 2017-08-05 ENCOUNTER — Ambulatory Visit: Payer: Medicare Other | Admitting: Neurology

## 2017-08-08 ENCOUNTER — Other Ambulatory Visit: Payer: Self-pay | Admitting: Internal Medicine

## 2017-08-10 ENCOUNTER — Encounter: Payer: Self-pay | Admitting: Podiatry

## 2017-08-10 ENCOUNTER — Ambulatory Visit (INDEPENDENT_AMBULATORY_CARE_PROVIDER_SITE_OTHER): Payer: Medicare Other | Admitting: Podiatry

## 2017-08-10 DIAGNOSIS — L84 Corns and callosities: Secondary | ICD-10-CM | POA: Diagnosis not present

## 2017-08-10 DIAGNOSIS — E1140 Type 2 diabetes mellitus with diabetic neuropathy, unspecified: Secondary | ICD-10-CM

## 2017-08-10 DIAGNOSIS — D689 Coagulation defect, unspecified: Secondary | ICD-10-CM | POA: Diagnosis not present

## 2017-08-10 DIAGNOSIS — E1149 Type 2 diabetes mellitus with other diabetic neurological complication: Secondary | ICD-10-CM

## 2017-08-11 NOTE — Progress Notes (Signed)
Subjective:   Patient ID: [name] [name], female   DOB: 72 y.o.   [name]: [id number]   HPI Patient presents stating she gets these lesions underneath the big toe joint of both feet that becomes bothersome and overall she is doing better with her diabetic shoes but these still are hard for her to take care of and she is on blood thinner   ROS      Objective:  Physical Exam  At risk patient with sub-1 callus bilateral that are keratotic and moderately painful when pressed lesions also in the big toes of both feet     Assessment:  At risk diabetic with vascular disease and also noted to be on blood thinner with thick callus formation bilateral that is painful     Plan:  Sharp sterile debridement accomplished bilateral with no iatrogenic bleeding and reappoint for routine care

## 2017-09-01 ENCOUNTER — Encounter (HOSPITAL_COMMUNITY): Payer: Medicare Other

## 2017-09-06 ENCOUNTER — Encounter: Payer: Self-pay | Admitting: Pulmonary Disease

## 2017-09-06 ENCOUNTER — Ambulatory Visit (INDEPENDENT_AMBULATORY_CARE_PROVIDER_SITE_OTHER): Payer: Medicare Other | Admitting: Pulmonary Disease

## 2017-09-06 DIAGNOSIS — I6529 Occlusion and stenosis of unspecified carotid artery: Secondary | ICD-10-CM | POA: Diagnosis not present

## 2017-09-06 DIAGNOSIS — J4530 Mild persistent asthma, uncomplicated: Secondary | ICD-10-CM | POA: Diagnosis not present

## 2017-09-06 DIAGNOSIS — G4734 Idiopathic sleep related nonobstructive alveolar hypoventilation: Secondary | ICD-10-CM

## 2017-09-06 NOTE — Progress Notes (Signed)
   Subjective:    Patient ID: [name] [name], female    DOB: Apr 09, 1945, 72 y.o.   [name]: [id number]  HPI   72 year old female never smoker followed for asthma and nocturnal hypoxemia on oxygen  Past medical history significant for ischemic cardiomyopathy and chronic diastolic heart failure, CVA  She has been maintained on Advair for many years, prefers MDI to discuss and powder. She has diastolic heart failure and is on very high doses of Demadex and uses Zaroxolyn if needed.  Continues to have pedal edema. Occasional intermittent wheezing especially during weather changes Wonders if portable oxygen would be of benefit  Significant tests/ events NPSG 2006: AHI 19/hr >> CPAP intolerant PSG 05/28/16 no OSA>> on O2 2 L  Echo 06/2015- EF 55-60%, RVSP 61 mm Right heart cath 05/2015 PCWP 9  Spirometry 06/2016 moderate restriction, ratio 83, FEV1 53%, FVC49%   Review of Systems neg for any significant sore throat, dysphagia, itching, sneezing, nasal congestion or excess/ purulent secretions, fever, chills, sweats, unintended wt loss, pleuritic or exertional cp, hempoptysis, orthopnea pnd or change in chronic leg swelling. Also denies presyncope, palpitations, heartburn, abdominal pain, nausea, vomiting, diarrhea or change in bowel or urinary habits, dysuria,hematuria, rash, arthralgias, visual complaints, headache, numbness weakness or ataxia.     Objective:   Physical Exam  Gen. Pleasant, obese, in no distress ENT - no lesions, no post nasal drip Neck: No JVD, no thyromegaly, no carotid bruits Lungs: no use of accessory muscles, no dullness to percussion, decreased without rales or rhonchi  Cardiovascular: Rhythm regular, heart sounds  normal, no murmurs or gallops, 1+ peripheral edema Musculoskeletal: No deformities, no cyanosis or clubbing , no tremors       Assessment & Plan:

## 2017-09-06 NOTE — Assessment & Plan Note (Signed)
Continue oxygen use during sleep. She has been intolerant of CPAP in the past

## 2017-09-06 NOTE — Assessment & Plan Note (Signed)
Continue Advair for maintenance and albuterol for rescue. Again I think this is well controlled at this point and the real concern is for chronic diastolic heart failure

## 2017-09-06 NOTE — Patient Instructions (Signed)
Continue on Advair twice daily. Continue using oxygen during sleep.

## 2017-09-14 NOTE — Progress Notes (Signed)
Advanced Heart Failure Clinic Note  Patient ID: [name] [name], female   DOB: March 31, 1945, 72 y.o.   [name]: [id number] PCP: Dr. Sandi Mariscal Cardiology: Dr. [name]  73 y.o. with history of CAD s/p CABG, diastolic CHF, and cerebrovascular disease with history of CVA presents for followup of CHF and diastolic CHF. She had CABG x 5 in 12/11.  Prior to the CABG she had multiple PCIs.  She had a CVA in 2/14 that presented as visual loss.  She had a left carotid stent in 2/15.   Left heart cath done in Sept. 2014. This showed patent SVG-D, LIMA-LAD, and sequential SVG-OM branches but SVG-RCA and the native RCA were both totally occluded. It was felt aht her increased symptoms coincided with occlusion of SVG-RCA.  She was started on Imdur to see if this would help with the chest pain and dyspnea.  However, she feels like Imdur causes leg cramps and does not think that she can take it.  So ranolazine 500 mg bid started and titrated this up to 1000 mg bid.  This helped some but not markedly.  Therefore, sent to  Dr. Irish Lack to address opening RCA CTO. He was able to do this in 5/15 with 4 overlapping Xience DES.  This led to resolution of exertional chest pain.    [name] [name] was started on Brilinta after PCI.  She became much more short of breath after starting on Brilinta. Per Dr Delano Metz stopped and replaced with Plavix.  Dyspnea improved off Brilinta. Given increasing exertional dyspnea and chest pain,  RHC/LHC was done in 4/17.  This showed stable CAD with no interventional target.  Left and right heart filling pressures were not significantly increased.  Medical management.   She had an MRI/MRA in May 2018 that showed moderately severe stenosis of the supraclinoid segment of the right internal carotid artery, progressed since the 2016 MR angiogram and severe basilar artery stenosis.   She developed recurrent exertional chest pain and had LHC in 6/18, showing 4/5 grafts patent with patent native RCA  (similar to prior cath, no changes).    Echo 1/19 with EF 55-60%, moderate diastolic dysfunction, PASP 50 mmHg, mildly dilated RV, mild AS, mild-moderate MR.   She returns for HF follow up. Overall doing okay. She has been more SOB over the last 2 months. She has also had more frequent CP over the last 2-3 weeks. She is having CP on exertion 2-3x/week that resolves with 1 or 2 SL nitro and rest. She has associated SOB. No other associated symptoms. CP typically lasts 1-2 minutes, but has lasted up to 30 minutes. She has occasional palpitations, which is unchanged. She sleeps sitting up chronically. No edema or PND. She has good UOP on torsemide. Weights have been stable around 290 lbs. She is more fatigued. Has started going to PT. Feels like she is getting stronger. Taking all medications. Limits fluid and salt intake.    Labs (6/14): K 3.8, creatinine 0.71 Labs (8/14): BNP 249, LDL 166 Labs (9/14): K 4.7, creatinine 0.7 Labs (9/14): K 4.6, creatinine 0.9, BNP 109 Labs (1/15): K 4.5, creatinine 0.73, LDL 212, HCT 43.4, TSH normal, BNP 138 Labs (6/15): K 4.7, creatinine 1.17, LDL 100, HDL 37, TGs 363, BNP 39 Labs (10/15): K 4.6, creatinine 1.2 Labs (1/16): LDL 157, HDL 43, K 5.2, creatinine 0.95 Labs (2/16): K 4.5, creatinine 1.08, BNP 113 Labs (4/16): LDL 50, HDL 56, TGs 179 Labs (9/16): K 5.3, creatinine 1.09 Labs (10/16): LDL  75, HDL 56 Labs (2/17): K 5.2, creatinine 1.07 Labs (4/17): K 5.1, creatinine 1.07 Labs (5/17): K 4.5, creatinine 1.01, LDL 96, HDL 56, BNP 70 Labs (03/2016) K 4.9, creatinine 1.07 Labs (7/18): K 4.9, creatinine 1.15 Labs (11/18): K 5.1, creatinine 1.11, LDL 36 Labs (1/19): K 4.9, creatinine 1.22 Labs (4/19): K 4.7, creatinine 1.2  PMH: 1. Diabetic gastroparesis 2. Type II diabetes 3. HTN 4. Morbid obesity 5. CAD: s/p CABG in 12/11 after prior PCIs.  LIMA-LAD, SVG-D, seq SVG-OM1 and OM2, SVG-PDA. Adenosine Cardiolite (8/14) with EF 53% and a small reversible  apical defect with a medium, partially reversible inferior defect.  LHC (9/14) with patent SVG-D, LIMA-LAD (50% distal LAD), and sequential SVG-OM branches; the SVG-RCA and the native RCA were occluded.  This was managed medically initially, but with ongoing exertional chest pain, it was decided to open CTO.  Patient had CTO opening with 4 overlapping Xience DES in the RCA in 5/15.   - LHC (4/17): SVG-D patent, LIMA-LAD patent, distal LAD with several 40-50% stenoses, sequential SVG-OM1 and PLOM patent with 50% proximal stenosis (not flow limiting), SVG-RCA TO with patent RCA stents.  - LHC (6/18): 4/5 grafts patent (SVG-RCA TO, same as past), RCA stents patent => no change.  6. Atypical migraines 7. OHS/OSA: Intolerant of CPAP. Uses oxygen with exertion and at night.  8. GERD with hiatal hernia 9. OA 10. Diastolic CHF: Echo (03/50) with EF 50-55%, grade II diastolic dysfunction, mild-moderate MR.  Echo (8/14) with EF 55-60%, grade II diastolic dysfunction, mildly increased aortic valve gradient (mean 12 mmHg) but valve opens well, mild MR and mild RV dilation.  Echo (9/15) with EF 60-65%, grade II diastolic dysfunction, mild aortic stenosis, mild mitral stenosis, mild to moderate mitral regurgitation, mildly dilated RV with normal systolic function, PA systolic pressure 46 mmHg.  - RHC (4/17): mean RA 9, PA 38/15 mean 27, mean PCWP 9, CI 2.5.  - Echo (5/17): EF 55-60%, mild LVH, mildly dilated RV with low normal systolic function, moderate TR, PASP 61 mmHg - Echo (1/19): EF 55-60%, moderate diastolic dysfunction, PASP 50 mmHg, mildly dilated RV, mild AS, mild-moderate MR. 11. CKD 12. Chronic LBBB 13. Anxiety 14. Carotid stenosis: Followed by [facility], >09% LICA stenosis 38/18.  She had left carotid stent in 2/15. Carotid dopplers 8/15 with no significant disease. Carotid dopplers (4/16): < 40% RICA stenosis.  15. Cerebrovascular disease: CVA 2/14 with right posterior cerebral artery territory ischemic  infarction. Cerebral angiogram in 6/14 showed 70% right vertebral ostial stenosis, 29-93% LICA stenosis, > 71% proximal left posterior cerebral artery stenosis, posterior communicating artery aneurysm.  Patient has had episodes of transient expressive aphasia.  Carotid dopplers (69/67) showed >89% LICA stenosis. She had a left carotid stent 2/15.  Possible CVA in 7/15. -  MRI/MRA in May 2018 that showed moderately severe stenosis of the supraclinoid segment of the right internal carotid artery, progressed since the 2016 MR angiogram and severe basilar artery stenosis.  16. Positional vertigo (suspected) 17. Palpitations: Holter (6/15) with rare PVCs/PACs.  - Event monitor (2/18): NSR, occasional PVCs 18. Dyspnea with Brilinta. 19. Melanoma: On face, s/p excision.   20. Aortic stenosis: Mild.  21. Holter (8/16) with no significant arrhythmias.  22. Lower extremity arterial dopplers (9/16) were normal.  23. Sleep study (4/18): No significant OSA.   SH: Married, nonsmoker  FH: CAD  Review of systems complete and found to be negative unless listed in HPI.   Current Outpatient Medications  Medication  Sig Dispense Refill  . acetaminophen (TYLENOL) 325 MG tablet Take 650 mg by mouth every 6 (six) hours as needed for mild pain.     Marland Kitchen albuterol (PROVENTIL) (2.5 MG/3ML) 0.083% nebulizer solution Take 2.5 mg by nebulization every 6 (six) hours as needed for shortness of breath (when asthma is active).     Marland Kitchen albuterol (PROVENTIL,VENTOLIN) 90 MCG/ACT inhaler Inhale 2 puffs into the lungs every 6 (six) hours as needed for shortness of breath.     . Alirocumab (PRALUENT) 150 MG/ML SOPN Inject 1 pen into the skin every 14 (fourteen) days. 6 pen 3  . ALPRAZolam (XANAX) 0.5 MG tablet Take 0.5-1 mg by mouth 3 (three) times daily. 0.5mg  in the morning and at lunch, 1mg  at bedtime    . amLODipine (NORVASC) 10 MG tablet Take 10 mg by mouth at bedtime.     Marland Kitchen aspirin EC 81 MG tablet Take 162 mg by mouth daily.      Marland Kitchen b complex vitamins capsule Take 1 capsule by mouth daily.    Marland Kitchen buPROPion (WELLBUTRIN XL) 150 MG 24 hr tablet Take 1 tablet by mouth daily.    . Calcium Carbonate 1500 (600 CA) MG TABS Take 1,500 mg by mouth daily with breakfast.    . Cholecalciferol (VITAMIN D) 2000 UNITS tablet Take 2,000 Units by mouth daily with breakfast.     . cloNIDine (CATAPRES) 0.2 MG tablet Take 0.2 mg by mouth 2 (two) times daily.    . clopidogrel (PLAVIX) 75 MG tablet TAKE ONE (1) TABLET EACH DAY 90 tablet 2  . denosumab (PROLIA) 60 MG/ML SOLN injection Inject 60 mg into the skin every 6 (six) months. Administer in upper arm, thigh, or abdomen    . DULoxetine (CYMBALTA) 30 MG capsule Take 1 capsule (30 mg total) by mouth 2 (two) times daily. 180 capsule 3  . ezetimibe (ZETIA) 10 MG tablet TAKE ONE (1) TABLET EACH DAY 90 tablet 3  . Fluticasone-Salmeterol (ADVAIR DISKUS) 250-50 MCG/DOSE AEPB Inhale 1 puff into the lungs 2 (two) times daily.     Marland Kitchen gabapentin (NEURONTIN) 600 MG tablet Take 600-1,200 mg by mouth 3 (three) times daily. 600mg  in the morning and at noon. 1200 mg at bedtime.    Marland Kitchen HUMALOG KWIKPEN 100 UNIT/ML SOPN Inject 18-26 Units into the skin 4 (four) times daily.     Marland Kitchen HYDROcodone-acetaminophen (NORCO/VICODIN) 5-325 MG per tablet Take 1 tablet by mouth at bedtime as needed for moderate pain.     Marland Kitchen ipratropium-albuterol (DUONEB) 0.5-2.5 (3) MG/3ML SOLN Take 3 mLs by nebulization as needed (If severe asthma , use every 4 hours for 10 days).     Marland Kitchen LANTUS SOLOSTAR 100 UNIT/ML Solostar Pen Inject 54 Units into the skin every morning. Can increase or decrease by 4 units depending on blood sugar levels    . loperamide (IMODIUM) 2 MG capsule Take 2 mg by mouth as needed for diarrhea or loose stools.    Marland Kitchen losartan (COZAAR) 100 MG tablet Take 100 mg by mouth daily with breakfast.     . magnesium oxide (MAG-OX) 400 MG tablet Take 400 mg by mouth 2 (two) times daily.    . meclizine (ANTIVERT) 12.5 MG tablet Take 12.5  mg by mouth 3 (three) times daily as needed for dizziness.    . methocarbamol (ROBAXIN) 500 MG tablet TAKE ONE (1) TABLET THREE (3) TIMES EACH DAY 60 tablet 4  . metolazone (ZAROXOLYN) 2.5 MG tablet Take 1 tablet (2.5 mg total)  by mouth once a week. Take 1 Tablet once weekly on Mondays. 15 tablet 3  . metoprolol succinate (TOPROL-XL) 25 MG 24 hr tablet TAKE ONE TABLET TWICE DAILY 180 tablet 3  . Multiple Vitamin (MULTIVITAMIN) capsule Take 1 capsule by mouth daily.    . multivitamin-lutein (OCUVITE-LUTEIN) CAPS capsule Take 1 capsule by mouth daily.    . nitroGLYCERIN (NITROSTAT) 0.3 MG SL tablet Place 1 tablet (0.3 mg total) under the tongue every 5 (five) minutes x 3 doses as needed for chest pain. 9 tablet 1  . ondansetron (ZOFRAN) 4 MG tablet Take 1 tablet (4 mg total) by mouth 2 (two) times daily as needed. For nausea (Patient taking differently: Take 4 mg by mouth 2 (two) times daily. For nausea) 20 tablet 0  . OXYGEN Inhale 2 mLs into the lungs.     . pantoprazole (PROTONIX) 40 MG tablet Take 1 tablet (40 mg total) by mouth 2 (two) times daily. 180 tablet 0  . RANEXA 1000 MG SR tablet TAKE ONE (1) TABLET BY MOUTH TWO (2) TIMES DAILY 180 each 2  . rosuvastatin (CRESTOR) 40 MG tablet TAKE ONE (1) TABLET EACH DAY 90 tablet 6  . torsemide (DEMADEX) 20 MG tablet Take 4 tablets (80 mg total) by mouth every morning AND 2 tablets (40 mg total) every evening. 240 tablet 3  . triamcinolone cream (KENALOG) 0.1 % Apply 1 application topically 2 (two) times daily.    Marland Kitchen triamterene-hydrochlorothiazide (DYAZIDE) 37.5-25 MG capsule Take 1 capsule by mouth daily.    . vitamin B-12 (CYANOCOBALAMIN) 1000 MCG tablet Take 1,000 mcg by mouth daily.    Marland Kitchen zinc gluconate 50 MG tablet Take 50 mg by mouth at bedtime.     Marland Kitchen zolpidem (AMBIEN) 10 MG tablet Take 10 mg by mouth at bedtime.      No current facility-administered medications for this encounter.    Vitals:   09/15/17 1329  BP: 134/72  Pulse: (!) 56    SpO2: 96%    BP 134/72 (BP Location: Right Wrist, Patient Position: Sitting, Cuff Size: Large)   Pulse (!) 56 Comment: irregular  Wt 133.4 kg (294 lb)   SpO2 96%   BMI 52.92 kg/m    Wt Readings from Last 3 Encounters:  09/15/17 133.4 kg (294 lb)  09/06/17 134.3 kg (296 lb)  07/15/17 135.2 kg (298 lb)    PHYSICAL EXAM General: Obese. Well appearing. No resp difficulty. HEENT: Normal Neck: Supple. JVP difficult but does not appear elevated. Carotids 2+ bilat; no bruits. No thyromegaly or nodule noted. Cor: PMI nondisplaced. RRR, 1/6 SEM RUSB Lungs: CTAB, normal effort. Abdomen: Soft, non-tender, non-distended, no HSM. No bruits or masses. +BS  Extremities: No cyanosis, clubbing, or rash. R and LLE trace ankle edema Neuro: Alert & orientedx3, cranial nerves grossly intact. moves all 4 extremities w/o difficulty. Affect pleasant  EKG: NSR 56 bpm with LBBB and PVCs. Personally reviewed.   Assessment/Plan: 1. CAD: Occluded native RCA and SVG-RCA.  Now status post opening of CTO RCA with 4 overlapping Xience DES in 5/15.  Most recent cath in 6/18 showed patent RCA stents, no interventional target. She is having more frequent CP that resolves with nitro.  - Continue ASA, statin, losartan, Crestor, amlodipine, Toprol XL 25 mg BID, and ranolazine.  - She will continue Plavix long-term.   - She has not tolerated Imdur in the past. (muscle pains, inability to walk) - Set up Cardiolite for next week. If abnormal, will need R/LHC with  Dr [name] the following week.  2. Chronic diastolic CHF:  Echo in 3/50 with EF 55-60%, moderate diastolic dysfunction.  - Chronic NYHA IIIb - Volume stable on exam.  - Continue torsemide 80 mg am, 40 mg pm.   - Considered spiro, but will hold off today with possible need for LHC.  3. Hyperlipidemia: She remains on Zetia, Crestor and Praluent.  Good lipids in 11/18. No change.  4. Hypertension: Stable. Continue current regimen 5. Cerebrovascular disease:  She has history of CVA and had left carotid stent in 2/15. - Followed by [facility].  6. Palpitations: Improved on higher Toprol XL.  Event monitor in 2/18 with few PVCs. Denies palpitations. Frequent PVCs on EKG today. Will check BMET, mag.  7. OHS/OSA: Cannot tolerate CPAP.  Uses oxygen with exertion and at night. Encouraged her to follow [facility] for home oxygen oximyzer - Follows with Dr Elsworth Soho. Saw this month, thought to be stable.  8. Depression - Encouraged her to follow up with her PCP for further management  Cardiolite. If positive, will need LHC. Discussed with Dr [name]. BMET, mag today Provided refill for nitroglycerin Follow up 4 weeks  Georgiana Shore, NP 09/15/2017   Greater than 50% of the 25 minute visit was spent in counseling/coordination of care regarding disease state education, salt/fluid restriction, sliding scale diuretics, and medication compliance.

## 2017-09-15 ENCOUNTER — Encounter (HOSPITAL_COMMUNITY): Payer: Self-pay

## 2017-09-15 ENCOUNTER — Ambulatory Visit (HOSPITAL_COMMUNITY)
Admission: RE | Admit: 2017-09-15 | Discharge: 2017-09-15 | Disposition: A | Discharge status: Home / Self Care | Payer: Medicare Other | Source: Ambulatory Visit | Attending: Internal Medicine | Admitting: Internal Medicine

## 2017-09-15 VITALS — BP 134/72 | HR 56 | Wt 294.0 lb

## 2017-09-15 DIAGNOSIS — I2510 Atherosclerotic heart disease of native coronary artery without angina pectoris: Secondary | ICD-10-CM | POA: Diagnosis not present

## 2017-09-15 DIAGNOSIS — Z6841 Body Mass Index (BMI) 40.0 and over, adult: Secondary | ICD-10-CM | POA: Insufficient documentation

## 2017-09-15 DIAGNOSIS — I25119 Atherosclerotic heart disease of native coronary artery with unspecified angina pectoris: Secondary | ICD-10-CM | POA: Diagnosis not present

## 2017-09-15 DIAGNOSIS — I493 Ventricular premature depolarization: Secondary | ICD-10-CM | POA: Diagnosis not present

## 2017-09-15 DIAGNOSIS — Z951 Presence of aortocoronary bypass graft: Secondary | ICD-10-CM | POA: Diagnosis not present

## 2017-09-15 DIAGNOSIS — Z8249 Family history of ischemic heart disease and other diseases of the circulatory system: Secondary | ICD-10-CM | POA: Diagnosis not present

## 2017-09-15 DIAGNOSIS — K219 Gastro-esophageal reflux disease without esophagitis: Secondary | ICD-10-CM | POA: Diagnosis not present

## 2017-09-15 DIAGNOSIS — I509 Heart failure, unspecified: Secondary | ICD-10-CM

## 2017-09-15 DIAGNOSIS — Z7902 Long term (current) use of antithrombotics/antiplatelets: Secondary | ICD-10-CM | POA: Insufficient documentation

## 2017-09-15 DIAGNOSIS — I130 Hypertensive heart and chronic kidney disease with heart failure and stage 1 through stage 4 chronic kidney disease, or unspecified chronic kidney disease: Secondary | ICD-10-CM | POA: Diagnosis not present

## 2017-09-15 DIAGNOSIS — E785 Hyperlipidemia, unspecified: Secondary | ICD-10-CM | POA: Diagnosis not present

## 2017-09-15 DIAGNOSIS — G4733 Obstructive sleep apnea (adult) (pediatric): Secondary | ICD-10-CM

## 2017-09-15 DIAGNOSIS — F329 Major depressive disorder, single episode, unspecified: Secondary | ICD-10-CM | POA: Insufficient documentation

## 2017-09-15 DIAGNOSIS — Z79899 Other long term (current) drug therapy: Secondary | ICD-10-CM | POA: Insufficient documentation

## 2017-09-15 DIAGNOSIS — E1122 Type 2 diabetes mellitus with diabetic chronic kidney disease: Secondary | ICD-10-CM | POA: Diagnosis not present

## 2017-09-15 DIAGNOSIS — Z7982 Long term (current) use of aspirin: Secondary | ICD-10-CM | POA: Insufficient documentation

## 2017-09-15 DIAGNOSIS — F419 Anxiety disorder, unspecified: Secondary | ICD-10-CM | POA: Insufficient documentation

## 2017-09-15 DIAGNOSIS — I679 Cerebrovascular disease, unspecified: Secondary | ICD-10-CM | POA: Insufficient documentation

## 2017-09-15 DIAGNOSIS — R002 Palpitations: Secondary | ICD-10-CM | POA: Insufficient documentation

## 2017-09-15 DIAGNOSIS — Z8673 Personal history of transient ischemic attack (TIA), and cerebral infarction without residual deficits: Secondary | ICD-10-CM | POA: Diagnosis not present

## 2017-09-15 DIAGNOSIS — K449 Diaphragmatic hernia without obstruction or gangrene: Secondary | ICD-10-CM | POA: Diagnosis not present

## 2017-09-15 DIAGNOSIS — E1143 Type 2 diabetes mellitus with diabetic autonomic (poly)neuropathy: Secondary | ICD-10-CM | POA: Insufficient documentation

## 2017-09-15 DIAGNOSIS — I5032 Chronic diastolic (congestive) heart failure: Secondary | ICD-10-CM | POA: Diagnosis present

## 2017-09-15 DIAGNOSIS — I447 Left bundle-branch block, unspecified: Secondary | ICD-10-CM | POA: Diagnosis not present

## 2017-09-15 DIAGNOSIS — I10 Essential (primary) hypertension: Secondary | ICD-10-CM

## 2017-09-15 DIAGNOSIS — M1990 Unspecified osteoarthritis, unspecified site: Secondary | ICD-10-CM | POA: Diagnosis not present

## 2017-09-15 DIAGNOSIS — N189 Chronic kidney disease, unspecified: Secondary | ICD-10-CM | POA: Diagnosis not present

## 2017-09-15 DIAGNOSIS — Z794 Long term (current) use of insulin: Secondary | ICD-10-CM | POA: Insufficient documentation

## 2017-09-15 LAB — BASIC METABOLIC PANEL
Anion gap: 9 (ref 5–15)
BUN: 21 mg/dL (ref 8–23)
CO2: 27 mmol/L (ref 22–32)
Calcium: 9.3 mg/dL (ref 8.9–10.3)
Chloride: 105 mmol/L (ref 98–111)
Creatinine, Ser: 1.29 mg/dL — ABNORMAL HIGH (ref 0.44–1.00)
GFR calc Af Amer: 48 mL/min — ABNORMAL LOW (ref >60)
GFR calc non Af Amer: 41 mL/min — ABNORMAL LOW (ref >60)
Glucose, Bld: 162 mg/dL — ABNORMAL HIGH (ref 70–99)
Potassium: 4.6 mmol/L (ref 3.5–5.1)
Sodium: 141 mmol/L (ref 135–145)

## 2017-09-15 LAB — MAGNESIUM: Magnesium: 2.2 mg/dL (ref 1.7–2.4)

## 2017-09-15 MED ORDER — METOLAZONE 2.5 MG PO TABS: 2.5000 mg | ORAL_TABLET | ORAL | 3 refills | Status: DC

## 2017-09-15 MED ORDER — NITROGLYCERIN 0.3 MG SL SUBL: 0.3000 mg | SUBLINGUAL_TABLET | SUBLINGUAL | 1 refills | Status: DC | PRN

## 2017-09-15 NOTE — Patient Instructions (Signed)
Refills sent to pharmacy.  Routine lab work today. Will notify you of abnormal results, otherwise no news is good news!  Will schedule you for a Myoview (nuclear stress test) at [facility]. Address: [address] Floor), [place], [place] [address]  Phone: [phone] Please wear comfortable clothes and shoes for this test. Avoid heavy meal before the test (light snack/meal recommended). Avoid caffeine, alcohol, tobacco products 8 hrs before test. Please give 24 hr notice for cancellations/rescheduling: [phone]. Their office will call you to schedule.  Follow up 3-4 weeks.  ____________________________________________________________________ [name] [name] Code: [id number]  Take all medication as prescribed the day of your appointment. Bring all medications with you to your appointment.  Do the following things EVERYDAY: 1) Weigh yourself in the morning before breakfast. Write it down and keep it in a log. 2) Take your medicines as prescribed 3) Eat low salt foods-Limit salt (sodium) to 2000 mg per day.  4) Stay as active as you can everyday 5) Limit all fluids for the day to less than 2 liters

## 2017-10-05 ENCOUNTER — Telehealth (HOSPITAL_COMMUNITY): Payer: Self-pay | Admitting: *Deleted

## 2017-10-05 NOTE — Telephone Encounter (Signed)
Patient given detailed instructions per Myocardial Perfusion Study Information Sheet for the test on 9/919 Patient notified to arrive 15 minutes early and that it is imperative to arrive on time for appointment to keep from having the test rescheduled.  If you need to cancel or reschedule your appointment, please call the office within 24 hours of your appointment. . Patient verbalized understanding Kirstie Peri

## 2017-10-10 ENCOUNTER — Ambulatory Visit (HOSPITAL_COMMUNITY): Payer: Medicare Other | Attending: Cardiology

## 2017-10-10 DIAGNOSIS — R0600 Dyspnea, unspecified: Secondary | ICD-10-CM | POA: Insufficient documentation

## 2017-10-10 DIAGNOSIS — E119 Type 2 diabetes mellitus without complications: Secondary | ICD-10-CM | POA: Insufficient documentation

## 2017-10-10 DIAGNOSIS — I447 Left bundle-branch block, unspecified: Secondary | ICD-10-CM | POA: Insufficient documentation

## 2017-10-10 DIAGNOSIS — I10 Essential (primary) hypertension: Secondary | ICD-10-CM | POA: Insufficient documentation

## 2017-10-10 DIAGNOSIS — I493 Ventricular premature depolarization: Secondary | ICD-10-CM | POA: Diagnosis not present

## 2017-10-10 DIAGNOSIS — R079 Chest pain, unspecified: Secondary | ICD-10-CM | POA: Insufficient documentation

## 2017-10-10 DIAGNOSIS — R0609 Other forms of dyspnea: Secondary | ICD-10-CM | POA: Diagnosis not present

## 2017-10-10 DIAGNOSIS — Z8673 Personal history of transient ischemic attack (TIA), and cerebral infarction without residual deficits: Secondary | ICD-10-CM | POA: Diagnosis not present

## 2017-10-10 DIAGNOSIS — I509 Heart failure, unspecified: Secondary | ICD-10-CM | POA: Diagnosis not present

## 2017-10-10 DIAGNOSIS — I2510 Atherosclerotic heart disease of native coronary artery without angina pectoris: Secondary | ICD-10-CM | POA: Diagnosis not present

## 2017-10-10 DIAGNOSIS — R9439 Abnormal result of other cardiovascular function study: Secondary | ICD-10-CM | POA: Diagnosis not present

## 2017-10-10 MED ORDER — TECHNETIUM TC 99M TETROFOSMIN IV KIT
32.4000 | PACK | Freq: Once | INTRAVENOUS | Status: AC | PRN
  Administered 2017-10-10: 32.4 via INTRAVENOUS
  Filled 2017-10-10: qty 33

## 2017-10-10 MED ORDER — REGADENOSON 0.4 MG/5ML IV SOLN
0.4000 mg | Freq: Once | INTRAVENOUS | Status: AC
  Administered 2017-10-10: 0.4 mg via INTRAVENOUS

## 2017-10-11 ENCOUNTER — Ambulatory Visit (HOSPITAL_COMMUNITY): Payer: Medicare Other | Attending: Cardiology

## 2017-10-11 LAB — MYOCARDIAL PERFUSION IMAGING
LV dias vol: 170 mL (ref 46–106)
LV sys vol: 88 mL
Peak HR: 67 {beats}/min
RATE: 0.36
Rest HR: 53 {beats}/min
SDS: 6
SRS: 8
SSS: 14
TID: 0.99

## 2017-10-11 MED ORDER — TECHNETIUM TC 99M TETROFOSMIN IV KIT
31.5000 | PACK | Freq: Once | INTRAVENOUS | Status: AC | PRN
  Administered 2017-10-11: 31.5 via INTRAVENOUS
  Filled 2017-10-11: qty 32

## 2017-10-12 ENCOUNTER — Telehealth (HOSPITAL_COMMUNITY): Payer: Self-pay | Admitting: *Deleted

## 2017-10-12 NOTE — Telephone Encounter (Signed)
Result Notes for Myocardial Perfusion Imaging   Notes recorded by Darron Doom, RN on 10/12/2017 at 10:46 AM EDT Patient has appointment in [facility] tomorrow, will discuss and schedule if patient is agreeable. Appt notes added. ------  Notes recorded by Larey Dresser, MD on 10/12/2017 at 10:35 AM EDT Abnormal cardiolite with evidence for some ischemia. Given increased chest pain, would arrange for left heart cath with me next week if she is agreeable. Needs pre-cath labs.

## 2017-10-13 ENCOUNTER — Ambulatory Visit (HOSPITAL_COMMUNITY)
Admission: RE | Admit: 2017-10-13 | Discharge: 2017-10-13 | Disposition: A | Discharge status: Home / Self Care | Payer: Medicare Other | Source: Ambulatory Visit | Attending: Cardiology | Admitting: Cardiology

## 2017-10-13 ENCOUNTER — Encounter (HOSPITAL_COMMUNITY): Payer: Self-pay | Admitting: Cardiology

## 2017-10-13 ENCOUNTER — Other Ambulatory Visit (HOSPITAL_COMMUNITY): Payer: Self-pay

## 2017-10-13 VITALS — BP 150/66 | Wt 297.0 lb

## 2017-10-13 DIAGNOSIS — Z79899 Other long term (current) drug therapy: Secondary | ICD-10-CM | POA: Diagnosis not present

## 2017-10-13 DIAGNOSIS — Z7902 Long term (current) use of antithrombotics/antiplatelets: Secondary | ICD-10-CM | POA: Diagnosis not present

## 2017-10-13 DIAGNOSIS — Z8249 Family history of ischemic heart disease and other diseases of the circulatory system: Secondary | ICD-10-CM | POA: Insufficient documentation

## 2017-10-13 DIAGNOSIS — Z6841 Body Mass Index (BMI) 40.0 and over, adult: Secondary | ICD-10-CM | POA: Diagnosis not present

## 2017-10-13 DIAGNOSIS — I130 Hypertensive heart and chronic kidney disease with heart failure and stage 1 through stage 4 chronic kidney disease, or unspecified chronic kidney disease: Secondary | ICD-10-CM | POA: Insufficient documentation

## 2017-10-13 DIAGNOSIS — F419 Anxiety disorder, unspecified: Secondary | ICD-10-CM | POA: Diagnosis not present

## 2017-10-13 DIAGNOSIS — I2510 Atherosclerotic heart disease of native coronary artery without angina pectoris: Secondary | ICD-10-CM | POA: Diagnosis not present

## 2017-10-13 DIAGNOSIS — G4733 Obstructive sleep apnea (adult) (pediatric): Secondary | ICD-10-CM | POA: Insufficient documentation

## 2017-10-13 DIAGNOSIS — N189 Chronic kidney disease, unspecified: Secondary | ICD-10-CM | POA: Diagnosis not present

## 2017-10-13 DIAGNOSIS — Z79891 Long term (current) use of opiate analgesic: Secondary | ICD-10-CM | POA: Insufficient documentation

## 2017-10-13 DIAGNOSIS — F329 Major depressive disorder, single episode, unspecified: Secondary | ICD-10-CM | POA: Diagnosis not present

## 2017-10-13 DIAGNOSIS — E1143 Type 2 diabetes mellitus with diabetic autonomic (poly)neuropathy: Secondary | ICD-10-CM | POA: Diagnosis not present

## 2017-10-13 DIAGNOSIS — Z955 Presence of coronary angioplasty implant and graft: Secondary | ICD-10-CM | POA: Diagnosis not present

## 2017-10-13 DIAGNOSIS — I509 Heart failure, unspecified: Secondary | ICD-10-CM

## 2017-10-13 DIAGNOSIS — Z85820 Personal history of malignant melanoma of skin: Secondary | ICD-10-CM | POA: Insufficient documentation

## 2017-10-13 DIAGNOSIS — I5032 Chronic diastolic (congestive) heart failure: Secondary | ICD-10-CM

## 2017-10-13 DIAGNOSIS — Z794 Long term (current) use of insulin: Secondary | ICD-10-CM | POA: Insufficient documentation

## 2017-10-13 DIAGNOSIS — Z7982 Long term (current) use of aspirin: Secondary | ICD-10-CM | POA: Diagnosis not present

## 2017-10-13 DIAGNOSIS — K449 Diaphragmatic hernia without obstruction or gangrene: Secondary | ICD-10-CM | POA: Diagnosis not present

## 2017-10-13 DIAGNOSIS — I350 Nonrheumatic aortic (valve) stenosis: Secondary | ICD-10-CM | POA: Diagnosis not present

## 2017-10-13 DIAGNOSIS — E1122 Type 2 diabetes mellitus with diabetic chronic kidney disease: Secondary | ICD-10-CM | POA: Diagnosis not present

## 2017-10-13 DIAGNOSIS — I25119 Atherosclerotic heart disease of native coronary artery with unspecified angina pectoris: Secondary | ICD-10-CM

## 2017-10-13 DIAGNOSIS — K219 Gastro-esophageal reflux disease without esophagitis: Secondary | ICD-10-CM | POA: Insufficient documentation

## 2017-10-13 DIAGNOSIS — Z951 Presence of aortocoronary bypass graft: Secondary | ICD-10-CM | POA: Insufficient documentation

## 2017-10-13 DIAGNOSIS — E785 Hyperlipidemia, unspecified: Secondary | ICD-10-CM | POA: Insufficient documentation

## 2017-10-13 DIAGNOSIS — Z8673 Personal history of transient ischemic attack (TIA), and cerebral infarction without residual deficits: Secondary | ICD-10-CM | POA: Insufficient documentation

## 2017-10-13 LAB — CBC
HCT: 46.3 % — ABNORMAL HIGH (ref 36.0–46.0)
Hemoglobin: 14.9 g/dL (ref 12.0–15.0)
MCH: 31.6 pg (ref 26.0–34.0)
MCHC: 32.2 g/dL (ref 30.0–36.0)
MCV: 98.1 fL (ref 78.0–100.0)
Platelets: 184 10*3/uL (ref 150–400)
RBC: 4.72 MIL/uL (ref 3.87–5.11)
RDW: 12.6 % (ref 11.5–15.5)
WBC: 8.7 10*3/uL (ref 4.0–10.5)

## 2017-10-13 LAB — BASIC METABOLIC PANEL
Anion gap: 11 (ref 5–15)
BUN: 15 mg/dL (ref 8–23)
CO2: 27 mmol/L (ref 22–32)
Calcium: 9.1 mg/dL (ref 8.9–10.3)
Chloride: 103 mmol/L (ref 98–111)
Creatinine, Ser: 1.11 mg/dL — ABNORMAL HIGH (ref 0.44–1.00)
GFR calc Af Amer: 57 mL/min — ABNORMAL LOW (ref >60)
GFR calc non Af Amer: 49 mL/min — ABNORMAL LOW (ref >60)
Glucose, Bld: 201 mg/dL — ABNORMAL HIGH (ref 70–99)
Potassium: 4.4 mmol/L (ref 3.5–5.1)
Sodium: 141 mmol/L (ref 135–145)

## 2017-10-13 LAB — PROTIME-INR

## 2017-10-13 NOTE — H&P (View-Only) (Signed)
Advanced Heart Failure Clinic Note  Patient ID: [name] [name], female   DOB: 1945/11/04, 72 y.o.   [name]: [id number] PCP: Dr. Sandi Mariscal Cardiology: Dr. [name]  72 y.o. with history of CAD s/p CABG, diastolic CHF, and cerebrovascular disease with history of CVA presents for followup of CHF and diastolic CHF. She had CABG x 5 in 12/11.  Prior to the CABG she had multiple PCIs.  She had a CVA in 2/14 that presented as visual loss.  She had a left carotid stent in 2/15.   Left heart cath done in Sept. 2014. This showed patent SVG-D, LIMA-LAD, and sequential SVG-OM branches but SVG-RCA and the native RCA were both totally occluded. It was felt aht her increased symptoms coincided with occlusion of SVG-RCA.  She was started on Imdur to see if this would help with the chest pain and dyspnea.  However, she feels like Imdur causes leg cramps and does not think that she can take it.  So ranolazine 500 mg bid started and titrated this up to 1000 mg bid.  This helped some but not markedly.  Therefore, sent to  Dr. Irish [name] to address opening RCA CTO. He was able to do this in 5/15 with 4 overlapping Xience DES.  This led to resolution of exertional chest pain.    Mrs [name] [name] was started on Brilinta after PCI.  She became much more short of breath after starting on Brilinta. Per Dr Delano Metz stopped and replaced with Plavix.  Dyspnea improved off Brilinta. Given increasing exertional dyspnea and chest pain,  RHC/LHC was done in 4/17.  This showed stable CAD with no interventional target.  Left and right heart filling pressures were not significantly increased.  Medical management.   She had an MRI/MRA in May 2018 that showed moderately severe stenosis of the supraclinoid segment of the right internal carotid artery, progressed since the 2016 MR angiogram and severe basilar artery stenosis.   She developed recurrent exertional chest pain and had LHC in 6/18, showing 4/5 grafts patent with patent native RCA  (similar to prior cath, no changes).    Echo 1/19 with EF 55-60%, moderate diastolic dysfunction, PASP 50 mmHg, mildly dilated RV, mild AS, mild-moderate MR.   Today she returns for HF follow up. Overall feeling fine. She remains SOB with exertion but this is her baseline. Denies PND/orthopnea. She notices chest pain if she is climbing steps. She has chest tightness when she is carrying items. Appetite ok. No fever or chills. Uses oxygen at night.  Weight at home  292-296 pounds. Taking all medications.  Labs (6/14): K 3.8, creatinine 0.71 Labs (8/14): BNP 249, LDL 166 Labs (9/14): K 4.7, creatinine 0.7 Labs (9/14): K 4.6, creatinine 0.9, BNP 109 Labs (1/15): K 4.5, creatinine 0.73, LDL 212, HCT 43.4, TSH normal, BNP 138 Labs (6/15): K 4.7, creatinine 1.17, LDL 100, HDL 37, TGs 363, BNP 39 Labs (10/15): K 4.6, creatinine 1.2 Labs (1/16): LDL 157, HDL 43, K 5.2, creatinine 0.95 Labs (2/16): K 4.5, creatinine 1.08, BNP 113 Labs (4/16): LDL 50, HDL 56, TGs 179 Labs (9/16): K 5.3, creatinine 1.09 Labs (10/16): LDL 75, HDL 56 Labs (2/17): K 5.2, creatinine 1.07 Labs (4/17): K 5.1, creatinine 1.07 Labs (5/17): K 4.5, creatinine 1.01, LDL 96, HDL 56, BNP 70 Labs (03/2016) K 4.9, creatinine 1.07 Labs (7/18): K 4.9, creatinine 1.15 Labs (11/18): K 5.1, creatinine 1.11, LDL 36 Labs (1/19): K 4.9, creatinine 1.22 Labs (4/19): K 4.7, creatinine 1.2 Labs (09/15/2017): K  4.6 Creatinine 1.29    PMH: 1. Diabetic gastroparesis 2. Type II diabetes 3. HTN 4. Morbid obesity 5. CAD: s/p CABG in 12/11 after prior PCIs.  LIMA-LAD, SVG-D, seq SVG-OM1 and OM2, SVG-PDA. Adenosine Cardiolite (8/14) with EF 53% and a small reversible apical defect with a medium, partially reversible inferior defect.  LHC (9/14) with patent SVG-D, LIMA-LAD (50% distal LAD), and sequential SVG-OM branches; the SVG-RCA and the native RCA were occluded.  This was managed medically initially, but with ongoing exertional chest pain,  it was decided to open CTO.  Patient had CTO opening with 4 overlapping Xience DES in the RCA in 5/15.   - LHC (4/17): SVG-D patent, LIMA-LAD patent, distal LAD with several 40-50% stenoses, sequential SVG-OM1 and PLOM patent with 50% proximal stenosis (not flow limiting), SVG-RCA TO with patent RCA stents.  - LHC (6/18): 4/5 grafts patent (SVG-RCA TO, same as past), RCA stents patent => no change.  - Myoview 10/10/2017 some concern for ischemia.  6. Atypical migraines 7. OHS/OSA: Intolerant of CPAP. Uses oxygen with exertion and at night.  8. GERD with hiatal hernia 9. OA 10. Diastolic CHF: Echo (09/81) with EF 50-55%, grade II diastolic dysfunction, mild-moderate MR.  Echo (8/14) with EF 55-60%, grade II diastolic dysfunction, mildly increased aortic valve gradient (mean 12 mmHg) but valve opens well, mild MR and mild RV dilation.  Echo (9/15) with EF 60-65%, grade II diastolic dysfunction, mild aortic stenosis, mild mitral stenosis, mild to moderate mitral regurgitation, mildly dilated RV with normal systolic function, PA systolic pressure 46 mmHg.  - RHC (4/17): mean RA 9, PA 38/15 mean 27, mean PCWP 9, CI 2.5.  - Echo (5/17): EF 55-60%, mild LVH, mildly dilated RV with low normal systolic function, moderate TR, PASP 61 mmHg - Echo (1/19): EF 55-60%, moderate diastolic dysfunction, PASP 50 mmHg, mildly dilated RV, mild AS, mild-moderate MR. 11. CKD 12. Chronic LBBB 13. Anxiety 14. Carotid stenosis: Followed by [facility], >19% LICA stenosis 14/78.  She had left carotid stent in 2/15. Carotid dopplers 8/15 with no significant disease. Carotid dopplers (4/16): < 40% RICA stenosis.  15. Cerebrovascular disease: CVA 2/14 with right posterior cerebral artery territory ischemic infarction. Cerebral angiogram in 6/14 showed 70% right vertebral ostial stenosis, 29-56% LICA stenosis, > 21% proximal left posterior cerebral artery stenosis, posterior communicating artery aneurysm.  Patient has had episodes of  transient expressive aphasia.  Carotid dopplers (30/86) showed >57% LICA stenosis. She had a left carotid stent 2/15.  Possible CVA in 7/15. -  MRI/MRA in May 2018 that showed moderately severe stenosis of the supraclinoid segment of the right internal carotid artery, progressed since the 2016 MR angiogram and severe basilar artery stenosis.  16. Positional vertigo (suspected) 17. Palpitations: Holter (6/15) with rare PVCs/PACs.  - Event monitor (2/18): NSR, occasional PVCs 18. Dyspnea with Brilinta. 19. Melanoma: On face, s/p excision.   20. Aortic stenosis: Mild.  21. Holter (8/16) with no significant arrhythmias.  22. Lower extremity arterial dopplers (9/16) were normal.  23. Sleep study (4/18): No significant OSA.   SH: Married, nonsmoker  FH: CAD  Review of systems complete and found to be negative unless listed in HPI.   Current Outpatient Medications  Medication Sig Dispense Refill  . acetaminophen (TYLENOL) 325 MG tablet Take 650 mg by mouth every 6 (six) hours as needed for mild pain.     Marland Kitchen albuterol (PROVENTIL) (2.5 MG/3ML) 0.083% nebulizer solution Take 2.5 mg by nebulization every 6 (six) hours as  needed for shortness of breath (when asthma is active).     Marland Kitchen albuterol (PROVENTIL,VENTOLIN) 90 MCG/ACT inhaler Inhale 2 puffs into the lungs every 6 (six) hours as needed for shortness of breath.     . Alirocumab (PRALUENT) 150 MG/ML SOPN Inject 1 pen into the skin every 14 (fourteen) days. 6 pen 3  . ALPRAZolam (XANAX) 0.5 MG tablet Take 0.5-1 mg by mouth 3 (three) times daily. 0.5mg  in the morning and at lunch, 1mg  at bedtime    . amLODipine (NORVASC) 10 MG tablet Take 10 mg by mouth at bedtime.     Marland Kitchen aspirin EC 81 MG tablet Take 162 mg by mouth daily.    Marland Kitchen b complex vitamins capsule Take 1 capsule by mouth daily.    Marland Kitchen buPROPion (WELLBUTRIN XL) 150 MG 24 hr tablet Take 1 tablet by mouth daily.    . Calcium Carbonate 1500 (600 CA) MG TABS Take 1,500 mg by mouth daily with  breakfast.    . Cholecalciferol (VITAMIN D) 2000 UNITS tablet Take 2,000 Units by mouth daily with breakfast.     . cloNIDine (CATAPRES) 0.2 MG tablet Take 0.2 mg by mouth 2 (two) times daily.    . clopidogrel (PLAVIX) 75 MG tablet TAKE ONE (1) TABLET EACH DAY 90 tablet 2  . denosumab (PROLIA) 60 MG/ML SOLN injection Inject 60 mg into the skin every 6 (six) months. Administer in upper arm, thigh, or abdomen    . DULoxetine (CYMBALTA) 30 MG capsule Take 1 capsule (30 mg total) by mouth 2 (two) times daily. 180 capsule 3  . ezetimibe (ZETIA) 10 MG tablet TAKE ONE (1) TABLET EACH DAY 90 tablet 3  . Fluticasone-Salmeterol (ADVAIR DISKUS) 250-50 MCG/DOSE AEPB Inhale 1 puff into the lungs 2 (two) times daily.     Marland Kitchen gabapentin (NEURONTIN) 600 MG tablet Take 600-1,200 mg by mouth 3 (three) times daily. 600mg  in the morning and at noon. 1200 mg at bedtime.    Marland Kitchen HUMALOG KWIKPEN 100 UNIT/ML SOPN Inject 18-26 Units into the skin 4 (four) times daily.     Marland Kitchen HYDROcodone-acetaminophen (NORCO/VICODIN) 5-325 MG per tablet Take 1 tablet by mouth at bedtime as needed for moderate pain.     Marland Kitchen ipratropium-albuterol (DUONEB) 0.5-2.5 (3) MG/3ML SOLN Take 3 mLs by nebulization as needed (If severe asthma , use every 4 hours for 10 days).     Marland Kitchen LANTUS SOLOSTAR 100 UNIT/ML Solostar Pen Inject 54 Units into the skin every morning. Can increase or decrease by 4 units depending on blood sugar levels    . loperamide (IMODIUM) 2 MG capsule Take 2 mg by mouth as needed for diarrhea or loose stools.    Marland Kitchen losartan (COZAAR) 100 MG tablet Take 100 mg by mouth daily with breakfast.     . magnesium oxide (MAG-OX) 400 MG tablet Take 400 mg by mouth 2 (two) times daily.    . meclizine (ANTIVERT) 12.5 MG tablet Take 12.5 mg by mouth 3 (three) times daily as needed for dizziness.    . methocarbamol (ROBAXIN) 500 MG tablet TAKE ONE (1) TABLET THREE (3) TIMES EACH DAY 60 tablet 4  . metolazone (ZAROXOLYN) 2.5 MG tablet Take 1 tablet (2.5 mg  total) by mouth once a week. Take 1 Tablet once weekly on Mondays. 15 tablet 3  . metoprolol succinate (TOPROL-XL) 25 MG 24 hr tablet TAKE ONE TABLET TWICE DAILY 180 tablet 3  . Multiple Vitamin (MULTIVITAMIN) capsule Take 1 capsule by mouth daily.    Marland Kitchen  multivitamin-lutein (OCUVITE-LUTEIN) CAPS capsule Take 1 capsule by mouth daily.    . nitroGLYCERIN (NITROSTAT) 0.3 MG SL tablet Place 1 tablet (0.3 mg total) under the tongue every 5 (five) minutes x 3 doses as needed for chest pain. 9 tablet 1  . ondansetron (ZOFRAN) 4 MG tablet Take 1 tablet (4 mg total) by mouth 2 (two) times daily as needed. For nausea (Patient taking differently: Take 4 mg by mouth 2 (two) times daily. For nausea) 20 tablet 0  . OXYGEN Inhale 2 mLs into the lungs.     . pantoprazole (PROTONIX) 40 MG tablet Take 1 tablet (40 mg total) by mouth 2 (two) times daily. 180 tablet 0  . RANEXA 1000 MG SR tablet TAKE ONE (1) TABLET BY MOUTH TWO (2) TIMES DAILY 180 each 2  . rosuvastatin (CRESTOR) 40 MG tablet TAKE ONE (1) TABLET EACH DAY 90 tablet 6  . torsemide (DEMADEX) 20 MG tablet Take 4 tablets (80 mg total) by mouth every morning AND 2 tablets (40 mg total) every evening. 240 tablet 3  . triamcinolone cream (KENALOG) 0.1 % Apply 1 application topically 2 (two) times daily.    Marland Kitchen triamterene-hydrochlorothiazide (DYAZIDE) 37.5-25 MG capsule Take 1 capsule by mouth daily.    . vitamin B-12 (CYANOCOBALAMIN) 1000 MCG tablet Take 1,000 mcg by mouth daily.    Marland Kitchen zinc gluconate 50 MG tablet Take 50 mg by mouth at bedtime.     Marland Kitchen zolpidem (AMBIEN) 10 MG tablet Take 10 mg by mouth at bedtime.      No current facility-administered medications for this encounter.    Vitals:   10/13/17 1338  BP: (!) 150/66  SpO2: 95%    BP (!) 150/66 (BP Location: Left Wrist, Patient Position: Sitting, Cuff Size: Normal)   Wt 134.7 kg (297 lb)   SpO2 95%   BMI 54.32 kg/m    Wt Readings from Last 3 Encounters:  10/13/17 134.7 kg (297 lb)  10/10/17  133.4 kg (294 lb)  09/15/17 133.4 kg (294 lb)    PHYSICAL EXAM General:  Dyspneic walking to the clinic.  HEENT: normal Neck: supple. JVP 9-10 . Carotids 2+ bilat; no bruits. No lymphadenopathy or thryomegaly appreciated. Cor: PMI nondisplaced. Regular rate & rhythm. No rubs, gallops. 1/6 SEM RUSB. Lungs: clear Abdomen: obese, soft, nontender, nondistended. No hepatosplenomegaly. No bruits or masses. Good bowel sounds. Extremities: no cyanosis, clubbing, rash, R and LLE 1+ edema Neuro: alert & orientedx3, cranial nerves grossly intact. moves all 4 extremities w/o difficulty. Affect pleasant    Assessment/Plan: 1. CAD: Occluded native RCA and SVG-RCA.  Now status post opening of CTO RCA with 4 overlapping Xience DES in 5/15.  Most recent cath in 6/18 showed patent RCA stents, no interventional target.  - Continue ASA, statin, losartan, Crestor, amlodipine, Toprol XL 25 mg BID, and ranolazine.  - She will continue Plavix long-term.   - She has not tolerated Imdur in the past. (muscle pains, inability to walk) - Cardiolite on 9/10 was abnormal with evidence for some ischemia. - Off LHC for next week with Dr [name].  2. Chronic diastolic CHF:  Echo in 5/46 with EF 55-60%, moderate diastolic dysfunction.  NYHA IIIb. Volume status elevated. Instructed to take an additional 20 mg torsemide for 2 days.  - Continue torsemide 80 mg am, 40 mg pm.   - Hold off on spiro with LHC. 3. Hyperlipidemia: She remains on Zetia, Crestor and Praluent.  Good lipids in 11/18. Check lipids next visit.  4. Hypertension:  Elevated. Hopefully will improve with diuresisStable. Continue current regimen 5. Cerebrovascular disease: She has history of CVA and had left carotid stent in 2/15. - Followed by [facility].  6. Palpitations: Improved on higher Toprol XL.  Event monitor in 2/18 with few PVCs. Resolved.   7. OHS/OSA: Cannot tolerate CPAP.  Uses oxygen with exertion and at night. Encouraged her to follow up with [facility]  for home oxygen oximyzer - Follows with Dr Elsworth Soho.  8. Depression - Encouraged her to follow up with her PCP for further management 9. Morbid Obesity Body mass index is 54.32 kg/m. Discussed portion control.   Set up [place] for next week by Dr [name]. Follow up in 6 weeks with Dr [name].   Darrick Grinder, NP 10/13/2017

## 2017-10-13 NOTE — Patient Instructions (Signed)
Take an extra 20 mg of torsemide for 2 days  Labs today We will only contact you if something comes back abnormal or we need to make some changes. Otherwise no news is good news!  Your physician has requested that you have a cardiac catheterization. Cardiac catheterization is used to diagnose and/or treat various heart conditions. Doctors may recommend this procedure for a number of different reasons. The most common reason is to evaluate chest pain. Chest pain can be a symptom of coronary artery disease (CAD), and cardiac catheterization can show whether plaque is narrowing or blocking your heart's arteries. This procedure is also used to evaluate the valves, as well as measure the blood flow and oxygen levels in different parts of your heart. For further information please visit [email or web address]. Please follow instruction sheet, as given.   Your physician recommends that you schedule a follow-up appointment in: 6 weeks with Dr.McLean

## 2017-10-13 NOTE — Progress Notes (Signed)
Advanced Heart Failure Clinic Note  Patient ID: [name] [name], female   DOB: Dec 24, 1945, 72 y.o.   [name]: [id number] PCP: Dr. Sandi Mariscal Cardiology: Dr. [name]  72 y.o. with history of CAD s/p CABG, diastolic CHF, and cerebrovascular disease with history of CVA presents for followup of CHF and diastolic CHF. She had CABG x 5 in 12/11.  Prior to the CABG she had multiple PCIs.  She had a CVA in 2/14 that presented as visual loss.  She had a left carotid stent in 2/15.   Left heart cath done in Sept. 2014. This showed patent SVG-D, LIMA-LAD, and sequential SVG-OM branches but SVG-RCA and the native RCA were both totally occluded. It was felt aht her increased symptoms coincided with occlusion of SVG-RCA.  She was started on Imdur to see if this would help with the chest pain and dyspnea.  However, she feels like Imdur causes leg cramps and does not think that she can take it.  So ranolazine 500 mg bid started and titrated this up to 1000 mg bid.  This helped some but not markedly.  Therefore, sent to  Dr. Irish Lack to address opening RCA CTO. He was able to do this in 5/15 with 4 overlapping Xience DES.  This led to resolution of exertional chest pain.    [name] [name] was started on Brilinta after PCI.  She became much more short of breath after starting on Brilinta. Per Dr Delano Metz stopped and replaced with Plavix.  Dyspnea improved off Brilinta. Given increasing exertional dyspnea and chest pain,  RHC/LHC was done in 4/17.  This showed stable CAD with no interventional target.  Left and right heart filling pressures were not significantly increased.  Medical management.   She had an MRI/MRA in May 2018 that showed moderately severe stenosis of the supraclinoid segment of the right internal carotid artery, progressed since the 2016 MR angiogram and severe basilar artery stenosis.   She developed recurrent exertional chest pain and had LHC in 6/18, showing 4/5 grafts patent with patent native RCA  (similar to prior cath, no changes).    Echo 1/19 with EF 55-60%, moderate diastolic dysfunction, PASP 50 mmHg, mildly dilated RV, mild AS, mild-moderate MR.   Today she returns for HF follow up. Overall feeling fine. She remains SOB with exertion but this is her baseline. Denies PND/orthopnea. She notices chest pain if she is climbing steps. She has chest tightness when she is carrying items. Appetite ok. No fever or chills. Uses oxygen at night.  Weight at home  292-296 pounds. Taking all medications.  Labs (6/14): K 3.8, creatinine 0.71 Labs (8/14): BNP 249, LDL 166 Labs (9/14): K 4.7, creatinine 0.7 Labs (9/14): K 4.6, creatinine 0.9, BNP 109 Labs (1/15): K 4.5, creatinine 0.73, LDL 212, HCT 43.4, TSH normal, BNP 138 Labs (6/15): K 4.7, creatinine 1.17, LDL 100, HDL 37, TGs 363, BNP 39 Labs (10/15): K 4.6, creatinine 1.2 Labs (1/16): LDL 157, HDL 43, K 5.2, creatinine 0.95 Labs (2/16): K 4.5, creatinine 1.08, BNP 113 Labs (4/16): LDL 50, HDL 56, TGs 179 Labs (9/16): K 5.3, creatinine 1.09 Labs (10/16): LDL 75, HDL 56 Labs (2/17): K 5.2, creatinine 1.07 Labs (4/17): K 5.1, creatinine 1.07 Labs (5/17): K 4.5, creatinine 1.01, LDL 96, HDL 56, BNP 70 Labs (03/2016) K 4.9, creatinine 1.07 Labs (7/18): K 4.9, creatinine 1.15 Labs (11/18): K 5.1, creatinine 1.11, LDL 36 Labs (1/19): K 4.9, creatinine 1.22 Labs (4/19): K 4.7, creatinine 1.2 Labs (09/15/2017): K  4.6 Creatinine 1.29    PMH: 1. Diabetic gastroparesis 2. Type II diabetes 3. HTN 4. Morbid obesity 5. CAD: s/p CABG in 12/11 after prior PCIs.  LIMA-LAD, SVG-D, seq SVG-OM1 and OM2, SVG-PDA. Adenosine Cardiolite (8/14) with EF 53% and a small reversible apical defect with a medium, partially reversible inferior defect.  LHC (9/14) with patent SVG-D, LIMA-LAD (50% distal LAD), and sequential SVG-OM branches; the SVG-RCA and the native RCA were occluded.  This was managed medically initially, but with ongoing exertional chest pain,  it was decided to open CTO.  Patient had CTO opening with 4 overlapping Xience DES in the RCA in 5/15.   - LHC (4/17): SVG-D patent, LIMA-LAD patent, distal LAD with several 40-50% stenoses, sequential SVG-OM1 and PLOM patent with 50% proximal stenosis (not flow limiting), SVG-RCA TO with patent RCA stents.  - LHC (6/18): 4/5 grafts patent (SVG-RCA TO, same as past), RCA stents patent => no change.  - Myoview 10/10/2017 some concern for ischemia.  6. Atypical migraines 7. OHS/OSA: Intolerant of CPAP. Uses oxygen with exertion and at night.  8. GERD with hiatal hernia 9. OA 10. Diastolic CHF: Echo (76/19) with EF 50-55%, grade II diastolic dysfunction, mild-moderate MR.  Echo (8/14) with EF 55-60%, grade II diastolic dysfunction, mildly increased aortic valve gradient (mean 12 mmHg) but valve opens well, mild MR and mild RV dilation.  Echo (9/15) with EF 60-65%, grade II diastolic dysfunction, mild aortic stenosis, mild mitral stenosis, mild to moderate mitral regurgitation, mildly dilated RV with normal systolic function, PA systolic pressure 46 mmHg.  - RHC (4/17): mean RA 9, PA 38/15 mean 27, mean PCWP 9, CI 2.5.  - Echo (5/17): EF 55-60%, mild LVH, mildly dilated RV with low normal systolic function, moderate TR, PASP 61 mmHg - Echo (1/19): EF 55-60%, moderate diastolic dysfunction, PASP 50 mmHg, mildly dilated RV, mild AS, mild-moderate MR. 11. CKD 12. Chronic LBBB 13. Anxiety 14. Carotid stenosis: Followed by [facility], >50% LICA stenosis 93/26.  She had left carotid stent in 2/15. Carotid dopplers 8/15 with no significant disease. Carotid dopplers (4/16): < 40% RICA stenosis.  15. Cerebrovascular disease: CVA 2/14 with right posterior cerebral artery territory ischemic infarction. Cerebral angiogram in 6/14 showed 70% right vertebral ostial stenosis, 71-24% LICA stenosis, > 58% proximal left posterior cerebral artery stenosis, posterior communicating artery aneurysm.  Patient has had episodes of  transient expressive aphasia.  Carotid dopplers (09/98) showed >33% LICA stenosis. She had a left carotid stent 2/15.  Possible CVA in 7/15. -  MRI/MRA in May 2018 that showed moderately severe stenosis of the supraclinoid segment of the right internal carotid artery, progressed since the 2016 MR angiogram and severe basilar artery stenosis.  16. Positional vertigo (suspected) 17. Palpitations: Holter (6/15) with rare PVCs/PACs.  - Event monitor (2/18): NSR, occasional PVCs 18. Dyspnea with Brilinta. 19. Melanoma: On face, s/p excision.   20. Aortic stenosis: Mild.  21. Holter (8/16) with no significant arrhythmias.  22. Lower extremity arterial dopplers (9/16) were normal.  23. Sleep study (4/18): No significant OSA.   SH: Married, nonsmoker  FH: CAD  Review of systems complete and found to be negative unless listed in HPI.   Current Outpatient Medications  Medication Sig Dispense Refill  . acetaminophen (TYLENOL) 325 MG tablet Take 650 mg by mouth every 6 (six) hours as needed for mild pain.     Marland Kitchen albuterol (PROVENTIL) (2.5 MG/3ML) 0.083% nebulizer solution Take 2.5 mg by nebulization every 6 (six) hours as  needed for shortness of breath (when asthma is active).     Marland Kitchen albuterol (PROVENTIL,VENTOLIN) 90 MCG/ACT inhaler Inhale 2 puffs into the lungs every 6 (six) hours as needed for shortness of breath.     . Alirocumab (PRALUENT) 150 MG/ML SOPN Inject 1 pen into the skin every 14 (fourteen) days. 6 pen 3  . ALPRAZolam (XANAX) 0.5 MG tablet Take 0.5-1 mg by mouth 3 (three) times daily. 0.5mg  in the morning and at lunch, 1mg  at bedtime    . amLODipine (NORVASC) 10 MG tablet Take 10 mg by mouth at bedtime.     Marland Kitchen aspirin EC 81 MG tablet Take 162 mg by mouth daily.    Marland Kitchen b complex vitamins capsule Take 1 capsule by mouth daily.    Marland Kitchen buPROPion (WELLBUTRIN XL) 150 MG 24 hr tablet Take 1 tablet by mouth daily.    . Calcium Carbonate 1500 (600 CA) MG TABS Take 1,500 mg by mouth daily with  breakfast.    . Cholecalciferol (VITAMIN D) 2000 UNITS tablet Take 2,000 Units by mouth daily with breakfast.     . cloNIDine (CATAPRES) 0.2 MG tablet Take 0.2 mg by mouth 2 (two) times daily.    . clopidogrel (PLAVIX) 75 MG tablet TAKE ONE (1) TABLET EACH DAY 90 tablet 2  . denosumab (PROLIA) 60 MG/ML SOLN injection Inject 60 mg into the skin every 6 (six) months. Administer in upper arm, thigh, or abdomen    . DULoxetine (CYMBALTA) 30 MG capsule Take 1 capsule (30 mg total) by mouth 2 (two) times daily. 180 capsule 3  . ezetimibe (ZETIA) 10 MG tablet TAKE ONE (1) TABLET EACH DAY 90 tablet 3  . Fluticasone-Salmeterol (ADVAIR DISKUS) 250-50 MCG/DOSE AEPB Inhale 1 puff into the lungs 2 (two) times daily.     Marland Kitchen gabapentin (NEURONTIN) 600 MG tablet Take 600-1,200 mg by mouth 3 (three) times daily. 600mg  in the morning and at noon. 1200 mg at bedtime.    Marland Kitchen HUMALOG KWIKPEN 100 UNIT/ML SOPN Inject 18-26 Units into the skin 4 (four) times daily.     Marland Kitchen HYDROcodone-acetaminophen (NORCO/VICODIN) 5-325 MG per tablet Take 1 tablet by mouth at bedtime as needed for moderate pain.     Marland Kitchen ipratropium-albuterol (DUONEB) 0.5-2.5 (3) MG/3ML SOLN Take 3 mLs by nebulization as needed (If severe asthma , use every 4 hours for 10 days).     Marland Kitchen LANTUS SOLOSTAR 100 UNIT/ML Solostar Pen Inject 54 Units into the skin every morning. Can increase or decrease by 4 units depending on blood sugar levels    . loperamide (IMODIUM) 2 MG capsule Take 2 mg by mouth as needed for diarrhea or loose stools.    Marland Kitchen losartan (COZAAR) 100 MG tablet Take 100 mg by mouth daily with breakfast.     . magnesium oxide (MAG-OX) 400 MG tablet Take 400 mg by mouth 2 (two) times daily.    . meclizine (ANTIVERT) 12.5 MG tablet Take 12.5 mg by mouth 3 (three) times daily as needed for dizziness.    . methocarbamol (ROBAXIN) 500 MG tablet TAKE ONE (1) TABLET THREE (3) TIMES EACH DAY 60 tablet 4  . metolazone (ZAROXOLYN) 2.5 MG tablet Take 1 tablet (2.5 mg  total) by mouth once a week. Take 1 Tablet once weekly on Mondays. 15 tablet 3  . metoprolol succinate (TOPROL-XL) 25 MG 24 hr tablet TAKE ONE TABLET TWICE DAILY 180 tablet 3  . Multiple Vitamin (MULTIVITAMIN) capsule Take 1 capsule by mouth daily.    Marland Kitchen  multivitamin-lutein (OCUVITE-LUTEIN) CAPS capsule Take 1 capsule by mouth daily.    . nitroGLYCERIN (NITROSTAT) 0.3 MG SL tablet Place 1 tablet (0.3 mg total) under the tongue every 5 (five) minutes x 3 doses as needed for chest pain. 9 tablet 1  . ondansetron (ZOFRAN) 4 MG tablet Take 1 tablet (4 mg total) by mouth 2 (two) times daily as needed. For nausea (Patient taking differently: Take 4 mg by mouth 2 (two) times daily. For nausea) 20 tablet 0  . OXYGEN Inhale 2 mLs into the lungs.     . pantoprazole (PROTONIX) 40 MG tablet Take 1 tablet (40 mg total) by mouth 2 (two) times daily. 180 tablet 0  . RANEXA 1000 MG SR tablet TAKE ONE (1) TABLET BY MOUTH TWO (2) TIMES DAILY 180 each 2  . rosuvastatin (CRESTOR) 40 MG tablet TAKE ONE (1) TABLET EACH DAY 90 tablet 6  . torsemide (DEMADEX) 20 MG tablet Take 4 tablets (80 mg total) by mouth every morning AND 2 tablets (40 mg total) every evening. 240 tablet 3  . triamcinolone cream (KENALOG) 0.1 % Apply 1 application topically 2 (two) times daily.    Marland Kitchen triamterene-hydrochlorothiazide (DYAZIDE) 37.5-25 MG capsule Take 1 capsule by mouth daily.    . vitamin B-12 (CYANOCOBALAMIN) 1000 MCG tablet Take 1,000 mcg by mouth daily.    Marland Kitchen zinc gluconate 50 MG tablet Take 50 mg by mouth at bedtime.     Marland Kitchen zolpidem (AMBIEN) 10 MG tablet Take 10 mg by mouth at bedtime.      No current facility-administered medications for this encounter.    Vitals:   10/13/17 1338  BP: (!) 150/66  SpO2: 95%    BP (!) 150/66 (BP Location: Left Wrist, Patient Position: Sitting, Cuff Size: Normal)   Wt 134.7 kg (297 lb)   SpO2 95%   BMI 54.32 kg/m    Wt Readings from Last 3 Encounters:  10/13/17 134.7 kg (297 lb)  10/10/17  133.4 kg (294 lb)  09/15/17 133.4 kg (294 lb)    PHYSICAL EXAM General:  Dyspneic walking to the clinic.  HEENT: normal Neck: supple. JVP 9-10 . Carotids 2+ bilat; no bruits. No lymphadenopathy or thryomegaly appreciated. Cor: PMI nondisplaced. Regular rate & rhythm. No rubs, gallops. 1/6 SEM RUSB. Lungs: clear Abdomen: obese, soft, nontender, nondistended. No hepatosplenomegaly. No bruits or masses. Good bowel sounds. Extremities: no cyanosis, clubbing, rash, R and LLE 1+ edema Neuro: alert & orientedx3, cranial nerves grossly intact. moves all 4 extremities w/o difficulty. Affect pleasant    Assessment/Plan: 1. CAD: Occluded native RCA and SVG-RCA.  Now status post opening of CTO RCA with 4 overlapping Xience DES in 5/15.  Most recent cath in 6/18 showed patent RCA stents, no interventional target.  - Continue ASA, statin, losartan, Crestor, amlodipine, Toprol XL 25 mg BID, and ranolazine.  - She will continue Plavix long-term.   - She has not tolerated Imdur in the past. (muscle pains, inability to walk) - Cardiolite on 9/10 was abnormal with evidence for some ischemia. - Off LHC for next week with Dr [name].  2. Chronic diastolic CHF:  Echo in 2/45 with EF 55-60%, moderate diastolic dysfunction.  NYHA IIIb. Volume status elevated. Instructed to take an additional 20 mg torsemide for 2 days.  - Continue torsemide 80 mg am, 40 mg pm.   - Hold off on spiro with LHC. 3. Hyperlipidemia: She remains on Zetia, Crestor and Praluent.  Good lipids in 11/18. Check lipids next visit.  4. Hypertension:  Elevated. Hopefully will improve with diuresisStable. Continue current regimen 5. Cerebrovascular disease: She has history of CVA and had left carotid stent in 2/15. - Followed by [facility].  6. Palpitations: Improved on higher Toprol XL.  Event monitor in 2/18 with few PVCs. Resolved.   7. OHS/OSA: Cannot tolerate CPAP.  Uses oxygen with exertion and at night. Encouraged her to follow up with [facility]  for home oxygen oximyzer - Follows with Dr Elsworth Soho.  8. Depression - Encouraged her to follow up with her PCP for further management 9. Morbid Obesity Body mass index is 54.32 kg/m. Discussed portion control.   Set up [place] for next week by Dr [name]. Follow up in 6 weeks with Dr [name].   Darrick Grinder, NP 10/13/2017

## 2017-10-20 ENCOUNTER — Other Ambulatory Visit: Payer: Self-pay

## 2017-10-20 ENCOUNTER — Inpatient Hospital Stay (HOSPITAL_COMMUNITY)
Admission: RE | Admit: 2017-10-20 | Discharge: 2017-10-23 | DRG: 246 | Disposition: A | Discharge status: Home / Self Care | Payer: Medicare Other | Source: Ambulatory Visit | Attending: Cardiology | Admitting: Cardiology

## 2017-10-20 ENCOUNTER — Encounter (HOSPITAL_COMMUNITY): Payer: Self-pay

## 2017-10-20 ENCOUNTER — Ambulatory Visit (HOSPITAL_COMMUNITY)
Admission: RE | Disposition: A | Discharge status: Home / Self Care | Payer: Self-pay | Source: Ambulatory Visit | Attending: Cardiology

## 2017-10-20 DIAGNOSIS — Z79899 Other long term (current) drug therapy: Secondary | ICD-10-CM

## 2017-10-20 DIAGNOSIS — F419 Anxiety disorder, unspecified: Secondary | ICD-10-CM | POA: Diagnosis present

## 2017-10-20 DIAGNOSIS — Z955 Presence of coronary angioplasty implant and graft: Secondary | ICD-10-CM

## 2017-10-20 DIAGNOSIS — Z880 Allergy status to penicillin: Secondary | ICD-10-CM

## 2017-10-20 DIAGNOSIS — T82857A Stenosis of cardiac prosthetic devices, implants and grafts, initial encounter: Principal | ICD-10-CM | POA: Diagnosis present

## 2017-10-20 DIAGNOSIS — Z7982 Long term (current) use of aspirin: Secondary | ICD-10-CM

## 2017-10-20 DIAGNOSIS — Z951 Presence of aortocoronary bypass graft: Secondary | ICD-10-CM

## 2017-10-20 DIAGNOSIS — G43009 Migraine without aura, not intractable, without status migrainosus: Secondary | ICD-10-CM | POA: Diagnosis present

## 2017-10-20 DIAGNOSIS — Z888 Allergy status to other drugs, medicaments and biological substances status: Secondary | ICD-10-CM

## 2017-10-20 DIAGNOSIS — H547 Unspecified visual loss: Secondary | ICD-10-CM | POA: Diagnosis present

## 2017-10-20 DIAGNOSIS — I25708 Atherosclerosis of coronary artery bypass graft(s), unspecified, with other forms of angina pectoris: Secondary | ICD-10-CM

## 2017-10-20 DIAGNOSIS — K219 Gastro-esophageal reflux disease without esophagitis: Secondary | ICD-10-CM | POA: Diagnosis present

## 2017-10-20 DIAGNOSIS — Z881 Allergy status to other antibiotic agents status: Secondary | ICD-10-CM

## 2017-10-20 DIAGNOSIS — I493 Ventricular premature depolarization: Secondary | ICD-10-CM | POA: Diagnosis not present

## 2017-10-20 DIAGNOSIS — Z8673 Personal history of transient ischemic attack (TIA), and cerebral infarction without residual deficits: Secondary | ICD-10-CM

## 2017-10-20 DIAGNOSIS — I447 Left bundle-branch block, unspecified: Secondary | ICD-10-CM | POA: Diagnosis present

## 2017-10-20 DIAGNOSIS — E662 Morbid (severe) obesity with alveolar hypoventilation: Secondary | ICD-10-CM | POA: Diagnosis present

## 2017-10-20 DIAGNOSIS — I350 Nonrheumatic aortic (valve) stenosis: Secondary | ICD-10-CM | POA: Diagnosis present

## 2017-10-20 DIAGNOSIS — K449 Diaphragmatic hernia without obstruction or gangrene: Secondary | ICD-10-CM | POA: Diagnosis present

## 2017-10-20 DIAGNOSIS — Z6841 Body Mass Index (BMI) 40.0 and over, adult: Secondary | ICD-10-CM

## 2017-10-20 DIAGNOSIS — Z85820 Personal history of malignant melanoma of skin: Secondary | ICD-10-CM

## 2017-10-20 DIAGNOSIS — K3184 Gastroparesis: Secondary | ICD-10-CM | POA: Diagnosis present

## 2017-10-20 DIAGNOSIS — Z9981 Dependence on supplemental oxygen: Secondary | ICD-10-CM

## 2017-10-20 DIAGNOSIS — Y832 Surgical operation with anastomosis, bypass or graft as the cause of abnormal reaction of the patient, or of later complication, without mention of misadventure at the time of the procedure: Secondary | ICD-10-CM | POA: Diagnosis present

## 2017-10-20 DIAGNOSIS — Z794 Long term (current) use of insulin: Secondary | ICD-10-CM

## 2017-10-20 DIAGNOSIS — I25810 Atherosclerosis of coronary artery bypass graft(s) without angina pectoris: Secondary | ICD-10-CM | POA: Diagnosis present

## 2017-10-20 DIAGNOSIS — I4891 Unspecified atrial fibrillation: Secondary | ICD-10-CM | POA: Diagnosis present

## 2017-10-20 DIAGNOSIS — Z95828 Presence of other vascular implants and grafts: Secondary | ICD-10-CM

## 2017-10-20 DIAGNOSIS — I2582 Chronic total occlusion of coronary artery: Secondary | ICD-10-CM | POA: Diagnosis present

## 2017-10-20 DIAGNOSIS — E1143 Type 2 diabetes mellitus with diabetic autonomic (poly)neuropathy: Secondary | ICD-10-CM | POA: Diagnosis present

## 2017-10-20 DIAGNOSIS — I340 Nonrheumatic mitral (valve) insufficiency: Secondary | ICD-10-CM | POA: Diagnosis present

## 2017-10-20 DIAGNOSIS — I5033 Acute on chronic diastolic (congestive) heart failure: Secondary | ICD-10-CM | POA: Diagnosis present

## 2017-10-20 DIAGNOSIS — E1122 Type 2 diabetes mellitus with diabetic chronic kidney disease: Secondary | ICD-10-CM | POA: Diagnosis present

## 2017-10-20 DIAGNOSIS — Z91048 Other nonmedicinal substance allergy status: Secondary | ICD-10-CM

## 2017-10-20 DIAGNOSIS — I5032 Chronic diastolic (congestive) heart failure: Secondary | ICD-10-CM

## 2017-10-20 DIAGNOSIS — N183 Chronic kidney disease, stage 3 (moderate): Secondary | ICD-10-CM | POA: Diagnosis present

## 2017-10-20 DIAGNOSIS — N189 Chronic kidney disease, unspecified: Secondary | ICD-10-CM | POA: Diagnosis present

## 2017-10-20 DIAGNOSIS — Z8249 Family history of ischemic heart disease and other diseases of the circulatory system: Secondary | ICD-10-CM

## 2017-10-20 DIAGNOSIS — Z7902 Long term (current) use of antithrombotics/antiplatelets: Secondary | ICD-10-CM

## 2017-10-20 DIAGNOSIS — I2510 Atherosclerotic heart disease of native coronary artery without angina pectoris: Secondary | ICD-10-CM | POA: Diagnosis present

## 2017-10-20 DIAGNOSIS — E785 Hyperlipidemia, unspecified: Secondary | ICD-10-CM | POA: Diagnosis present

## 2017-10-20 DIAGNOSIS — F329 Major depressive disorder, single episode, unspecified: Secondary | ICD-10-CM | POA: Diagnosis present

## 2017-10-20 DIAGNOSIS — I130 Hypertensive heart and chronic kidney disease with heart failure and stage 1 through stage 4 chronic kidney disease, or unspecified chronic kidney disease: Secondary | ICD-10-CM | POA: Diagnosis present

## 2017-10-20 DIAGNOSIS — I480 Paroxysmal atrial fibrillation: Secondary | ICD-10-CM | POA: Diagnosis present

## 2017-10-20 HISTORY — PX: RIGHT/LEFT HEART CATH AND CORONARY/GRAFT ANGIOGRAPHY: CATH118267

## 2017-10-20 HISTORY — PX: CORONARY STENT INTERVENTION: CATH118234

## 2017-10-20 LAB — CBC
HCT: 45.7 % (ref 36.0–46.0)
Hemoglobin: 14.9 g/dL (ref 12.0–15.0)
MCH: 31.5 pg (ref 26.0–34.0)
MCHC: 32.6 g/dL (ref 30.0–36.0)
MCV: 96.6 fL (ref 78.0–100.0)
Platelets: 201 10*3/uL (ref 150–400)
RBC: 4.73 MIL/uL (ref 3.87–5.11)
RDW: 12.6 % (ref 11.5–15.5)
WBC: 7.7 10*3/uL (ref 4.0–10.5)

## 2017-10-20 LAB — GLUCOSE, CAPILLARY
Glucose-Capillary: 307 mg/dL — ABNORMAL HIGH (ref 70–99)
Glucose-Capillary: 182 mg/dL — ABNORMAL HIGH (ref 70–99)
Glucose-Capillary: 173 mg/dL — ABNORMAL HIGH (ref 70–99)
Glucose-Capillary: 178 mg/dL — ABNORMAL HIGH (ref 70–99)
Glucose-Capillary: 189 mg/dL — ABNORMAL HIGH (ref 70–99)

## 2017-10-20 LAB — POCT I-STAT 3, VENOUS BLOOD GAS (G3P V)
Acid-Base Excess: 3 mmol/L — ABNORMAL HIGH (ref 0.0–2.0)
Acid-Base Excess: 3 mmol/L — ABNORMAL HIGH (ref 0.0–2.0)
Bicarbonate: 29.7 mmol/L — ABNORMAL HIGH (ref 20.0–28.0)
Bicarbonate: 30.3 mmol/L — ABNORMAL HIGH (ref 20.0–28.0)
O2 Saturation: 73 %
O2 Saturation: 75 %
TCO2: 31 mmol/L (ref 22–32)
TCO2: 32 mmol/L (ref 22–32)
pCO2, Ven: 55.1 mmHg (ref 44.0–60.0)
pCO2, Ven: 57.5 mmHg (ref 44.0–60.0)
pH, Ven: 7.33 (ref 7.250–7.430)
pH, Ven: 7.34 (ref 7.250–7.430)
pO2, Ven: 42 mmHg (ref 32.0–45.0)
pO2, Ven: 43 mmHg (ref 32.0–45.0)

## 2017-10-20 LAB — TYPE AND SCREEN
ABO/RH(D): AB POS
Antibody Screen: NEGATIVE

## 2017-10-20 LAB — POCT ACTIVATED CLOTTING TIME: Activated Clotting Time: 406 seconds

## 2017-10-20 SURGERY — RIGHT/LEFT HEART CATH AND CORONARY/GRAFT ANGIOGRAPHY
Anesthesia: LOCAL

## 2017-10-20 MED ORDER — ASPIRIN 81 MG PO CHEW: 81.0000 mg | CHEWABLE_TABLET | ORAL | Status: DC

## 2017-10-20 MED ORDER — LIDOCAINE HCL (PF) 1 % IJ SOLN
INTRAMUSCULAR | Status: DC | PRN
  Administered 2017-10-20: 15 mL
  Administered 2017-10-20: 2 mL

## 2017-10-20 MED ORDER — SODIUM CHLORIDE 0.9 % IV SOLN: INTRAVENOUS | Status: AC

## 2017-10-20 MED ORDER — CLOPIDOGREL BISULFATE 75 MG PO TABS
75.0000 mg | ORAL_TABLET | Freq: Every day | ORAL | Status: DC
  Administered 2017-10-21 – 2017-10-23 (×3): 75 mg via ORAL
  Filled 2017-10-20 (×3): qty 1

## 2017-10-20 MED ORDER — HEPARIN (PORCINE) IN NACL 1000-0.9 UT/500ML-% IV SOLN
INTRAVENOUS | Status: DC | PRN
  Administered 2017-10-20: 500 mL

## 2017-10-20 MED ORDER — FENTANYL CITRATE (PF) 100 MCG/2ML IJ SOLN: 50.0000 ug | Freq: Once | INTRAMUSCULAR | Status: DC

## 2017-10-20 MED ORDER — ACETAMINOPHEN 325 MG PO TABS: 650.0000 mg | ORAL_TABLET | ORAL | Status: DC | PRN

## 2017-10-20 MED ORDER — HEPARIN (PORCINE) IN NACL 1000-0.9 UT/500ML-% IV SOLN
INTRAVENOUS | Status: AC
Start: 2017-10-20 — End: ?
  Filled 2017-10-20: qty 1000

## 2017-10-20 MED ORDER — FENTANYL CITRATE (PF) 100 MCG/2ML IJ SOLN
INTRAMUSCULAR | Status: DC | PRN
  Administered 2017-10-20: 50 ug via INTRAVENOUS
  Administered 2017-10-20 (×2): 25 ug via INTRAVENOUS

## 2017-10-20 MED ORDER — NITROGLYCERIN 1 MG/10 ML FOR IR/CATH LAB
INTRA_ARTERIAL | Status: AC
  Filled 2017-10-20: qty 10

## 2017-10-20 MED ORDER — BIVALIRUDIN BOLUS VIA INFUSION - CUPID
INTRAVENOUS | Status: DC | PRN
  Administered 2017-10-20: 101.025 mg via INTRAVENOUS

## 2017-10-20 MED ORDER — FENTANYL CITRATE (PF) 100 MCG/2ML IJ SOLN
50.0000 ug | Freq: Once | INTRAMUSCULAR | Status: AC
  Administered 2017-10-20: 17:00:00 50 ug via INTRAVENOUS
  Filled 2017-10-20: qty 2

## 2017-10-20 MED ORDER — SODIUM CHLORIDE 0.9% FLUSH: 3.0000 mL | INTRAVENOUS | Status: DC | PRN

## 2017-10-20 MED ORDER — ATORVASTATIN CALCIUM 80 MG PO TABS
80.0000 mg | ORAL_TABLET | Freq: Every day | ORAL | Status: DC
  Administered 2017-10-20: 18:00:00 80 mg via ORAL
  Filled 2017-10-20: qty 1

## 2017-10-20 MED ORDER — HYDROCODONE-ACETAMINOPHEN 5-325 MG PO TABS
1.0000 | ORAL_TABLET | ORAL | Status: DC | PRN
  Administered 2017-10-20: 2 via ORAL
  Filled 2017-10-20: qty 2

## 2017-10-20 MED ORDER — CLOPIDOGREL BISULFATE 75 MG PO TABS
75.0000 mg | ORAL_TABLET | Freq: Once | ORAL | Status: DC
  Filled 2017-10-20: qty 1

## 2017-10-20 MED ORDER — SODIUM CHLORIDE 0.9 % IV SOLN
INTRAVENOUS | Status: AC
  Administered 2017-10-20: 15:00:00 via INTRAVENOUS

## 2017-10-20 MED ORDER — DULOXETINE HCL 30 MG PO CPEP
30.0000 mg | ORAL_CAPSULE | Freq: Two times a day (BID) | ORAL | Status: DC
  Administered 2017-10-20 – 2017-10-23 (×6): 30 mg via ORAL
  Filled 2017-10-20 (×7): qty 1

## 2017-10-20 MED ORDER — LIDOCAINE HCL (PF) 1 % IJ SOLN
INTRAMUSCULAR | Status: AC
  Filled 2017-10-20: qty 30

## 2017-10-20 MED ORDER — ZOLPIDEM TARTRATE 5 MG PO TABS: 5.0000 mg | ORAL_TABLET | Freq: Every evening | ORAL | Status: DC | PRN

## 2017-10-20 MED ORDER — ANGIOPLASTY BOOK
Freq: Once | Status: AC
  Administered 2017-10-20: 22:00:00
  Filled 2017-10-20: qty 1

## 2017-10-20 MED ORDER — NITROGLYCERIN 1 MG/10 ML FOR IR/CATH LAB
INTRA_ARTERIAL | Status: DC | PRN
  Administered 2017-10-20 (×2): 200 ug via INTRACORONARY

## 2017-10-20 MED ORDER — SODIUM CHLORIDE 0.9 % IV SOLN: 250.0000 mL | INTRAVENOUS | Status: DC | PRN

## 2017-10-20 MED ORDER — CLOPIDOGREL BISULFATE 300 MG PO TABS
ORAL_TABLET | ORAL | Status: DC | PRN
  Administered 2017-10-20: 150 mg via ORAL

## 2017-10-20 MED ORDER — CLOPIDOGREL BISULFATE 75 MG PO TABS
ORAL_TABLET | ORAL | Status: AC
  Filled 2017-10-20: qty 1

## 2017-10-20 MED ORDER — SODIUM CHLORIDE 0.9 % IV SOLN
INTRAVENOUS | Status: DC
  Administered 2017-10-20: 12:00:00 via INTRAVENOUS

## 2017-10-20 MED ORDER — FENTANYL CITRATE (PF) 100 MCG/2ML IJ SOLN
INTRAMUSCULAR | Status: AC
  Filled 2017-10-20: qty 2

## 2017-10-20 MED ORDER — INSULIN ASPART 100 UNIT/ML ~~LOC~~ SOLN
0.0000 [IU] | Freq: Three times a day (TID) | SUBCUTANEOUS | Status: DC
  Administered 2017-10-20: 18:00:00 2 [IU] via SUBCUTANEOUS
  Administered 2017-10-21: 13:00:00 7 [IU] via SUBCUTANEOUS
  Administered 2017-10-21: 5 [IU] via SUBCUTANEOUS
  Administered 2017-10-21: 3 [IU] via SUBCUTANEOUS
  Administered 2017-10-22: 5 [IU] via SUBCUTANEOUS
  Administered 2017-10-22: 3 [IU] via SUBCUTANEOUS
  Administered 2017-10-22: 7 [IU] via SUBCUTANEOUS
  Administered 2017-10-23: 5 [IU] via SUBCUTANEOUS
  Administered 2017-10-23: 3 [IU] via SUBCUTANEOUS

## 2017-10-20 MED ORDER — SODIUM CHLORIDE 0.9% FLUSH
3.0000 mL | Freq: Two times a day (BID) | INTRAVENOUS | Status: DC
  Administered 2017-10-21 – 2017-10-23 (×4): 3 mL via INTRAVENOUS

## 2017-10-20 MED ORDER — SODIUM CHLORIDE 0.9 % IV SOLN
INTRAVENOUS | Status: DC | PRN
  Administered 2017-10-20: 1.75 mg/kg/h via INTRAVENOUS

## 2017-10-20 MED ORDER — SODIUM CHLORIDE 0.9% IV SOLUTION: Freq: Once | INTRAVENOUS | Status: DC

## 2017-10-20 MED ORDER — HYDRALAZINE HCL 20 MG/ML IJ SOLN
5.0000 mg | INTRAMUSCULAR | Status: AC | PRN
  Administered 2017-10-20: 5 mg via INTRAVENOUS
  Filled 2017-10-20: qty 1

## 2017-10-20 MED ORDER — IOHEXOL 350 MG/ML SOLN
INTRAVENOUS | Status: DC | PRN
  Administered 2017-10-20: 100 mL via INTRA_ARTERIAL
  Administered 2017-10-20: 60 mL via INTRA_ARTERIAL

## 2017-10-20 MED ORDER — SODIUM CHLORIDE 0.9 % IV SOLN
1.7500 mg/kg/h | INTRAVENOUS | Status: DC
  Filled 2017-10-20: qty 250

## 2017-10-20 MED ORDER — MIDAZOLAM HCL 2 MG/2ML IJ SOLN
INTRAMUSCULAR | Status: AC
  Filled 2017-10-20: qty 2

## 2017-10-20 MED ORDER — BIVALIRUDIN TRIFLUOROACETATE 250 MG IV SOLR
INTRAVENOUS | Status: AC
  Filled 2017-10-20: qty 250

## 2017-10-20 MED ORDER — ASPIRIN 81 MG PO CHEW
81.0000 mg | CHEWABLE_TABLET | Freq: Every day | ORAL | Status: DC
  Administered 2017-10-21 – 2017-10-23 (×3): 81 mg via ORAL
  Filled 2017-10-20 (×3): qty 1

## 2017-10-20 MED ORDER — SODIUM CHLORIDE 0.9% FLUSH: 3.0000 mL | Freq: Two times a day (BID) | INTRAVENOUS | Status: DC

## 2017-10-20 MED ORDER — SODIUM CHLORIDE 0.9 % IV SOLN
INTRAVENOUS | Status: DC
  Administered 2017-10-20: 09:00:00 via INTRAVENOUS

## 2017-10-20 MED ORDER — ONDANSETRON HCL 4 MG/2ML IJ SOLN
4.0000 mg | Freq: Four times a day (QID) | INTRAMUSCULAR | Status: DC | PRN
  Administered 2017-10-21 (×2): 4 mg via INTRAVENOUS
  Filled 2017-10-20 (×2): qty 2

## 2017-10-20 MED ORDER — LABETALOL HCL 5 MG/ML IV SOLN: 10.0000 mg | INTRAVENOUS | Status: AC | PRN

## 2017-10-20 MED ORDER — DIAZEPAM 5 MG PO TABS: 5.0000 mg | ORAL_TABLET | Freq: Four times a day (QID) | ORAL | Status: DC | PRN

## 2017-10-20 MED ORDER — MIDAZOLAM HCL 2 MG/2ML IJ SOLN
INTRAMUSCULAR | Status: DC | PRN
  Administered 2017-10-20 (×2): 1 mg via INTRAVENOUS

## 2017-10-20 SURGICAL SUPPLY — 16 items
BALLN SAPPHIRE ~~LOC~~ 3.75X12 (BALLOONS) ×2 IMPLANT
CATH BALLN WEDGE 5F 110CM (CATHETERS) ×2 IMPLANT
CATH INFINITI 5 FR 3DRC (CATHETERS) ×2 IMPLANT
CATH INFINITI 5FR MULTPACK ANG (CATHETERS) ×2 IMPLANT
CATH VISTA GUIDE 6FR LCB (CATHETERS) ×2 IMPLANT
KIT ENCORE 26 ADVANTAGE (KITS) ×2 IMPLANT
KIT HEART LEFT (KITS) ×2 IMPLANT
PACK CARDIAC CATHETERIZATION (CUSTOM PROCEDURE TRAY) ×2 IMPLANT
SHEATH GLIDE SLENDER 4/5FR (SHEATH) ×2 IMPLANT
SHEATH PINNACLE 5F 10CM (SHEATH) ×2 IMPLANT
SHEATH PINNACLE 6F 10CM (SHEATH) ×2 IMPLANT
STENT SIERRA 3.50 X 18 MM (Permanent Stent) ×2 IMPLANT
TRANSDUCER W/STOPCOCK (MISCELLANEOUS) ×2 IMPLANT
TUBING CIL FLEX 10 FLL-RA (TUBING) ×2 IMPLANT
WIRE ASAHI PROWATER 180CM (WIRE) ×2 IMPLANT
WIRE EMERALD 3MM-J .035X150CM (WIRE) ×2 IMPLANT

## 2017-10-20 NOTE — Progress Notes (Signed)
   Called by staff nurse for severe back pain. She has a history of back pain.   Earlier today she underwent RHC/LHC with stent placed to distal circumflex.   Sheath still in place. Per nursing staff. Site is ok.   Discussed with Dr Haroldine Laws with following orders placed -->. Check  type and screen, CBC, and give 50 mcg fentanyl.   I also notified the interventional APP Vinn Bhagatt PA.    Andrez Lieurance NP-C  4:15 PM

## 2017-10-20 NOTE — Progress Notes (Signed)
Patient is having severe back pain as she is laying down since this morning. Sheath is still in place. Pending CBC. Given order for 35mcg fentanyl to pulled sheet. No hematoma currently on exam. Unable to examine back due to Sheet in place. Discussed with nurse.

## 2017-10-20 NOTE — Progress Notes (Signed)
Jennifer,RN notified that pt took her Lantus this am.  SSI held for now

## 2017-10-20 NOTE — Interval H&P Note (Signed)
History and Physical Interval Note:  10/20/2017 9:52 AM  [name] [name]  has presented today for surgery, with the diagnosis of hf  The various methods of treatment have been discussed with the patient and family. After consideration of risks, benefits and other options for treatment, the patient has consented to  Procedure(s): RIGHT/LEFT HEART CATH AND CORONARY/GRAFT ANGIOGRAPHY (N/A) as a surgical intervention .  The patient's history has been reviewed, patient examined, no change in status, stable for surgery.  I have reviewed the patient's chart and labs.  Questions were answered to the patient's satisfaction.     Sankalp Ferrell [employer]

## 2017-10-20 NOTE — Progress Notes (Signed)
ANTICOAGULATION CONSULT NOTE - Initial Consult  Pharmacy Consult for bivalirudin Indication: chest pain/ACS  Allergies  Allergen Reactions  . Amoxicillin Shortness Of Breath and Rash  . Brilinta [Ticagrelor] Shortness Of Breath  . Erythromycin Shortness Of Breath, Other (See Comments) and Hives    Trouble swallowing  . Flagyl [Metronidazole] Palpitations and Shortness Of Breath  . Penicillins Hives, Shortness Of Breath, Rash and Other (See Comments)    Has patient had a PCN reaction causing immediate rash, facial/tongue/throat swelling, SOB or lightheadedness with hypotension: Yes Has patient had a PCN reaction causing severe rash involving mucus membranes or skin necrosis: No Has patient had a PCN reaction that required hospitalization: Yes Has patient had a PCN reaction occurring within the last 10 years: No If all of the above answers are "NO", then may proceed with Cephalosporin use.   . Isosorbide Mononitrate [Isosorbide Nitrate] Other (See Comments)    Joint aches, muscles hurt, difficult to walk   . Jardiance [Empagliflozin] Other (See Comments)    Nausea, joint aches, muscles aches  . Metformin And Related Other (See Comments)    Stomach pain, cold sweats, joint pain, burred vision, dizziness  . Tape Other (See Comments)    Skin pulls off with certain types Plastic tape causes skin to rip if left on for long periods of time  . Erythromycin Base Rash    Patient Measurements: Height: 5\' 4"  (162.6 cm) Weight: 297 lb (134.7 kg) IBW/kg (Calculated) : 54.7   Vital Signs: Temp: 97.6 F (36.4 C) (09/19 0756) Temp Source: Oral (09/19 0756) BP: 139/72 (09/19 1143) Pulse Rate: 84 (09/19 1143)  Labs: No results for input(s): HGB, HCT, PLT, APTT, LABPROT, INR, HEPARINUNFRC, HEPRLOWMOCWT, CREATININE, CKTOTAL, CKMB, TROPONINI in the last 72 hours.  Estimated Creatinine Clearance: 65.5 mL/min (A) (by C-G formula based on SCr of 1.11 mg/dL (H)).   Medical History: Past  Medical History:  Diagnosis Date  . Abnormality of gait 05/14/2014  . Anemia    hx  . Anginal pain ([place])   . Anxiety   . Asthma   . Bundle branch block, left    chronic/notes 07/18/2013  . Cancer [facility]) April 2016   BCC,  Melanoma  . CHF (congestive heart failure) ([place])   . Chronic bronchitis ([place])    "off and on all the time" (07/18/2013)  . Chronic insomnia 05/06/2015  . Chronic kidney disease    frequency, sses dr Jamal Maes every 4 to 6 months  . Chronic low back pain 08/24/2016  . Chronic lower back pain   . Claustrophobia   . Common migraine 05/14/2014  . Coronary artery disease   . Dysrhythmia   . GERD (gastroesophageal reflux disease)   . H/O hiatal hernia   . Headache    "probably 4 days/wk" (07/18/2013)  . Heart murmur   . Hyperlipidemia   . Hypertension   . Irregular heart beat   . Leg cramps    both legs at times  . Memory change 05/14/2014  . Mental disorder    hx of confusion  . Migraine    "used to have them really bad; kind of eased up now; down to 1 q couple months" (07/18/2013)  . Myocardial infarction ([place])    04/1999, 02/2000, 01/2005; 2011; 2014  . Neuromuscular disorder ([place])    ?  . Obesity 01-2010  . Obstructive sleep apnea    "can't wear machine; I have claustrophobia" (07/18/2013)  . Osteoarthritis    "knees and hands" (07/18/2013)  .  Other and unspecified angina pectoris   . Peripheral vascular disease ([facility])    ? numbness, tingling arms and legs  . Pneumonia 2000's   "once"  . PONV (postoperative nausea and vomiting)   . Shortness of breath    with exertion  . Stroke ([place]) 03/22/12   right side brain; denies residual on 07/18/2013)  . Stroke [facility]) Oct. 2015   Affected pt.s balance  . Stroke ([place]) 10/2014  . Type II diabetes mellitus ([place])   . Ventral hernia    hx of  . Vomiting    persistent  . Vomiting blood      Assessment: 78 yoF s/p coronary stenting. Pt started on bivalirudin, pharmacy consulted to continue until pt leaves cath  lab.  Goal of Therapy:  Monitor platelets by anticoagulation protocol: Yes   Plan:  Bivalirudin 1.75 mg/kg/hr until pt leaves cath lab Pharmacy will sign off, reconsult if needed  Arrie Senate, PharmD, BCPS Clinical Pharmacist [phone] Please check AMION for all [facility] Pharmacy numbers 10/20/2017

## 2017-10-20 NOTE — Progress Notes (Signed)
Site area: right groin  Site Prior to Removal:  Level 0  Pressure Applied For 20 MINUTES    Minutes Beginning at 1650  Manual:   Yes.    Patient Status During Pull:  stable  Post Pull Groin Site:  Level 1  Post Pull Instructions Given:  Yes.    Post Pull Pulses Present:  Yes.    Dressing Applied:  Yes.    Comments:

## 2017-10-21 ENCOUNTER — Other Ambulatory Visit: Payer: Self-pay

## 2017-10-21 DIAGNOSIS — I10 Essential (primary) hypertension: Secondary | ICD-10-CM | POA: Diagnosis not present

## 2017-10-21 DIAGNOSIS — I2510 Atherosclerotic heart disease of native coronary artery without angina pectoris: Secondary | ICD-10-CM | POA: Diagnosis present

## 2017-10-21 DIAGNOSIS — I25708 Atherosclerosis of coronary artery bypass graft(s), unspecified, with other forms of angina pectoris: Secondary | ICD-10-CM | POA: Diagnosis present

## 2017-10-21 DIAGNOSIS — F419 Anxiety disorder, unspecified: Secondary | ICD-10-CM | POA: Diagnosis present

## 2017-10-21 DIAGNOSIS — E662 Morbid (severe) obesity with alveolar hypoventilation: Secondary | ICD-10-CM | POA: Diagnosis present

## 2017-10-21 DIAGNOSIS — I340 Nonrheumatic mitral (valve) insufficiency: Secondary | ICD-10-CM | POA: Diagnosis present

## 2017-10-21 DIAGNOSIS — I25700 Atherosclerosis of coronary artery bypass graft(s), unspecified, with unstable angina pectoris: Secondary | ICD-10-CM

## 2017-10-21 DIAGNOSIS — I480 Paroxysmal atrial fibrillation: Secondary | ICD-10-CM | POA: Diagnosis present

## 2017-10-21 DIAGNOSIS — I5032 Chronic diastolic (congestive) heart failure: Secondary | ICD-10-CM | POA: Diagnosis not present

## 2017-10-21 DIAGNOSIS — I350 Nonrheumatic aortic (valve) stenosis: Secondary | ICD-10-CM | POA: Diagnosis present

## 2017-10-21 DIAGNOSIS — N189 Chronic kidney disease, unspecified: Secondary | ICD-10-CM | POA: Diagnosis present

## 2017-10-21 DIAGNOSIS — K449 Diaphragmatic hernia without obstruction or gangrene: Secondary | ICD-10-CM | POA: Diagnosis present

## 2017-10-21 DIAGNOSIS — I2582 Chronic total occlusion of coronary artery: Secondary | ICD-10-CM | POA: Diagnosis present

## 2017-10-21 DIAGNOSIS — I4891 Unspecified atrial fibrillation: Secondary | ICD-10-CM | POA: Diagnosis not present

## 2017-10-21 DIAGNOSIS — I5033 Acute on chronic diastolic (congestive) heart failure: Secondary | ICD-10-CM | POA: Diagnosis present

## 2017-10-21 DIAGNOSIS — I447 Left bundle-branch block, unspecified: Secondary | ICD-10-CM | POA: Diagnosis present

## 2017-10-21 DIAGNOSIS — I130 Hypertensive heart and chronic kidney disease with heart failure and stage 1 through stage 4 chronic kidney disease, or unspecified chronic kidney disease: Secondary | ICD-10-CM | POA: Diagnosis present

## 2017-10-21 DIAGNOSIS — G43009 Migraine without aura, not intractable, without status migrainosus: Secondary | ICD-10-CM | POA: Diagnosis present

## 2017-10-21 DIAGNOSIS — E1143 Type 2 diabetes mellitus with diabetic autonomic (poly)neuropathy: Secondary | ICD-10-CM | POA: Diagnosis present

## 2017-10-21 DIAGNOSIS — E1122 Type 2 diabetes mellitus with diabetic chronic kidney disease: Secondary | ICD-10-CM | POA: Diagnosis present

## 2017-10-21 DIAGNOSIS — Y832 Surgical operation with anastomosis, bypass or graft as the cause of abnormal reaction of the patient, or of later complication, without mention of misadventure at the time of the procedure: Secondary | ICD-10-CM | POA: Diagnosis present

## 2017-10-21 DIAGNOSIS — T82857A Stenosis of cardiac prosthetic devices, implants and grafts, initial encounter: Secondary | ICD-10-CM | POA: Diagnosis present

## 2017-10-21 DIAGNOSIS — I493 Ventricular premature depolarization: Secondary | ICD-10-CM | POA: Diagnosis not present

## 2017-10-21 DIAGNOSIS — F329 Major depressive disorder, single episode, unspecified: Secondary | ICD-10-CM | POA: Diagnosis present

## 2017-10-21 DIAGNOSIS — K3184 Gastroparesis: Secondary | ICD-10-CM | POA: Diagnosis present

## 2017-10-21 DIAGNOSIS — Z6841 Body Mass Index (BMI) 40.0 and over, adult: Secondary | ICD-10-CM | POA: Diagnosis not present

## 2017-10-21 DIAGNOSIS — H547 Unspecified visual loss: Secondary | ICD-10-CM | POA: Diagnosis present

## 2017-10-21 DIAGNOSIS — N183 Chronic kidney disease, stage 3 (moderate): Secondary | ICD-10-CM | POA: Diagnosis present

## 2017-10-21 DIAGNOSIS — E785 Hyperlipidemia, unspecified: Secondary | ICD-10-CM | POA: Diagnosis present

## 2017-10-21 DIAGNOSIS — K219 Gastro-esophageal reflux disease without esophagitis: Secondary | ICD-10-CM | POA: Diagnosis present

## 2017-10-21 LAB — LIPID PANEL
Cholesterol: 181 mg/dL (ref 0–200)
HDL: 42 mg/dL (ref >40)
LDL Cholesterol: 108 mg/dL — ABNORMAL HIGH (ref 0–99)
Total CHOL/HDL Ratio: 4.3 RATIO
Triglycerides: 157 mg/dL — ABNORMAL HIGH (ref <150)
VLDL: 31 mg/dL (ref 0–40)

## 2017-10-21 LAB — BASIC METABOLIC PANEL
Anion gap: 12 (ref 5–15)
BUN: 16 mg/dL (ref 8–23)
CO2: 26 mmol/L (ref 22–32)
Calcium: 9 mg/dL (ref 8.9–10.3)
Chloride: 101 mmol/L (ref 98–111)
Creatinine, Ser: 1.01 mg/dL — ABNORMAL HIGH (ref 0.44–1.00)
GFR calc Af Amer: >60 mL/min (ref >60)
GFR calc non Af Amer: 55 mL/min — ABNORMAL LOW (ref >60)
Glucose, Bld: 219 mg/dL — ABNORMAL HIGH (ref 70–99)
Potassium: 4.3 mmol/L (ref 3.5–5.1)
Sodium: 139 mmol/L (ref 135–145)

## 2017-10-21 LAB — CBC
HCT: 47.7 % — ABNORMAL HIGH (ref 36.0–46.0)
Hemoglobin: 15.6 g/dL — ABNORMAL HIGH (ref 12.0–15.0)
MCH: 31.6 pg (ref 26.0–34.0)
MCHC: 32.7 g/dL (ref 30.0–36.0)
MCV: 96.8 fL (ref 78.0–100.0)
Platelets: 196 10*3/uL (ref 150–400)
RBC: 4.93 MIL/uL (ref 3.87–5.11)
RDW: 12.6 % (ref 11.5–15.5)
WBC: 8.8 10*3/uL (ref 4.0–10.5)

## 2017-10-21 LAB — GLUCOSE, CAPILLARY
Glucose-Capillary: 213 mg/dL — ABNORMAL HIGH (ref 70–99)
Glucose-Capillary: 347 mg/dL — ABNORMAL HIGH (ref 70–99)
Glucose-Capillary: 258 mg/dL — ABNORMAL HIGH (ref 70–99)
Glucose-Capillary: 279 mg/dL — ABNORMAL HIGH (ref 70–99)

## 2017-10-21 LAB — HEMOGLOBIN A1C
Hgb A1c MFr Bld: 8 % — ABNORMAL HIGH (ref 4.8–5.6)
Mean Plasma Glucose: 182.9 mg/dL

## 2017-10-21 LAB — MAGNESIUM: Magnesium: 1.9 mg/dL (ref 1.7–2.4)

## 2017-10-21 LAB — TSH: TSH: 0.905 u[IU]/mL (ref 0.350–4.500)

## 2017-10-21 MED ORDER — GABAPENTIN 400 MG PO CAPS
1200.0000 mg | ORAL_CAPSULE | Freq: Every day | ORAL | Status: DC
  Administered 2017-10-21 – 2017-10-22 (×2): 1200 mg via ORAL
  Filled 2017-10-21 (×2): qty 3

## 2017-10-21 MED ORDER — PANTOPRAZOLE SODIUM 40 MG PO TBEC
40.0000 mg | DELAYED_RELEASE_TABLET | Freq: Every day | ORAL | Status: DC
  Administered 2017-10-21 – 2017-10-23 (×3): 40 mg via ORAL
  Filled 2017-10-21 (×4): qty 1

## 2017-10-21 MED ORDER — EZETIMIBE 10 MG PO TABS
10.0000 mg | ORAL_TABLET | Freq: Every day | ORAL | Status: DC
  Administered 2017-10-21 – 2017-10-23 (×3): 10 mg via ORAL
  Filled 2017-10-21 (×3): qty 1

## 2017-10-21 MED ORDER — NITROGLYCERIN 0.4 MG SL SUBL
SUBLINGUAL_TABLET | SUBLINGUAL | Status: AC
  Filled 2017-10-21: qty 3

## 2017-10-21 MED ORDER — AMIODARONE HCL IN DEXTROSE 360-4.14 MG/200ML-% IV SOLN
60.0000 mg/h | INTRAVENOUS | Status: AC
  Administered 2017-10-21: 09:00:00 60 mg/h via INTRAVENOUS
  Filled 2017-10-21 (×2): qty 200

## 2017-10-21 MED ORDER — METOPROLOL SUCCINATE ER 25 MG PO TB24
25.0000 mg | ORAL_TABLET | Freq: Two times a day (BID) | ORAL | Status: DC
  Administered 2017-10-21 – 2017-10-23 (×5): 25 mg via ORAL
  Filled 2017-10-21 (×5): qty 1

## 2017-10-21 MED ORDER — AMLODIPINE BESYLATE 10 MG PO TABS
10.0000 mg | ORAL_TABLET | Freq: Every day | ORAL | Status: DC
  Administered 2017-10-21 – 2017-10-23 (×3): 10 mg via ORAL
  Filled 2017-10-21 (×3): qty 1

## 2017-10-21 MED ORDER — FUROSEMIDE 10 MG/ML IJ SOLN
80.0000 mg | Freq: Two times a day (BID) | INTRAMUSCULAR | Status: DC
  Administered 2017-10-21 – 2017-10-23 (×5): 80 mg via INTRAVENOUS
  Filled 2017-10-21 (×5): qty 8

## 2017-10-21 MED ORDER — DILTIAZEM LOAD VIA INFUSION
15.0000 mg | Freq: Once | INTRAVENOUS | Status: AC
  Administered 2017-10-21: 05:00:00 15 mg via INTRAVENOUS
  Filled 2017-10-21: qty 15

## 2017-10-21 MED ORDER — AMIODARONE HCL IN DEXTROSE 360-4.14 MG/200ML-% IV SOLN
30.0000 mg/h | INTRAVENOUS | Status: DC
  Administered 2017-10-21: 30 mg/h via INTRAVENOUS
  Filled 2017-10-21 (×2): qty 200

## 2017-10-21 MED ORDER — GABAPENTIN 300 MG PO CAPS
600.0000 mg | ORAL_CAPSULE | Freq: Every day | ORAL | Status: DC
  Administered 2017-10-21 – 2017-10-23 (×3): 600 mg via ORAL
  Filled 2017-10-21 (×3): qty 2

## 2017-10-21 MED ORDER — APIXABAN 5 MG PO TABS
5.0000 mg | ORAL_TABLET | Freq: Two times a day (BID) | ORAL | Status: DC
  Administered 2017-10-21 – 2017-10-23 (×5): 5 mg via ORAL
  Filled 2017-10-21 (×5): qty 1

## 2017-10-21 MED ORDER — OFF THE BEAT BOOK
Freq: Once | Status: AC
  Administered 2017-10-21: 09:00:00
  Filled 2017-10-21: qty 1

## 2017-10-21 MED ORDER — ALPRAZOLAM 0.5 MG PO TABS
0.5000 mg | ORAL_TABLET | Freq: Every day | ORAL | Status: DC
  Administered 2017-10-23: 0.5 mg via ORAL
  Filled 2017-10-21: qty 1

## 2017-10-21 MED ORDER — LOSARTAN POTASSIUM 50 MG PO TABS
100.0000 mg | ORAL_TABLET | Freq: Every day | ORAL | Status: DC
  Administered 2017-10-21 – 2017-10-22 (×2): 100 mg via ORAL
  Filled 2017-10-21 (×2): qty 2

## 2017-10-21 MED ORDER — POTASSIUM CHLORIDE CRYS ER 20 MEQ PO TBCR
20.0000 meq | EXTENDED_RELEASE_TABLET | Freq: Two times a day (BID) | ORAL | Status: DC
  Administered 2017-10-21 – 2017-10-23 (×5): 20 meq via ORAL
  Filled 2017-10-21 (×5): qty 1

## 2017-10-21 MED ORDER — DILTIAZEM HCL-DEXTROSE 100-5 MG/100ML-% IV SOLN (PREMIX)
5.0000 mg/h | INTRAVENOUS | Status: DC
  Administered 2017-10-21: 06:00:00 10 mg/h via INTRAVENOUS
  Administered 2017-10-21: 5 mg/h via INTRAVENOUS
  Filled 2017-10-21 (×2): qty 100

## 2017-10-21 MED ORDER — GABAPENTIN 300 MG PO CAPS
600.0000 mg | ORAL_CAPSULE | Freq: Every day | ORAL | Status: DC
  Administered 2017-10-21 – 2017-10-23 (×3): 600 mg via ORAL
  Filled 2017-10-21 (×3): qty 2

## 2017-10-21 MED ORDER — INSULIN GLARGINE 100 UNIT/ML ~~LOC~~ SOLN
30.0000 [IU] | Freq: Every day | SUBCUTANEOUS | Status: DC
  Administered 2017-10-21 – 2017-10-22 (×2): 30 [IU] via SUBCUTANEOUS
  Filled 2017-10-21 (×4): qty 0.3

## 2017-10-21 MED ORDER — RANOLAZINE ER 500 MG PO TB12
1000.0000 mg | ORAL_TABLET | Freq: Two times a day (BID) | ORAL | Status: DC
  Administered 2017-10-21 – 2017-10-23 (×5): 1000 mg via ORAL
  Filled 2017-10-21 (×5): qty 2

## 2017-10-21 MED ORDER — ALPRAZOLAM 0.5 MG PO TABS
0.5000 mg | ORAL_TABLET | Freq: Every day | ORAL | Status: DC
  Administered 2017-10-21 – 2017-10-23 (×3): 0.5 mg via ORAL
  Filled 2017-10-21 (×3): qty 1

## 2017-10-21 MED ORDER — ROSUVASTATIN CALCIUM 20 MG PO TABS
40.0000 mg | ORAL_TABLET | Freq: Every day | ORAL | Status: DC
  Administered 2017-10-21 – 2017-10-22 (×2): 40 mg via ORAL
  Filled 2017-10-21 (×2): qty 2

## 2017-10-21 MED ORDER — ALPRAZOLAM 0.5 MG PO TABS
1.0000 mg | ORAL_TABLET | Freq: Every day | ORAL | Status: DC
  Administered 2017-10-21 – 2017-10-22 (×2): 1 mg via ORAL
  Filled 2017-10-21 (×2): qty 2

## 2017-10-21 MED ORDER — METHOCARBAMOL 500 MG PO TABS
500.0000 mg | ORAL_TABLET | Freq: Three times a day (TID) | ORAL | Status: DC
  Administered 2017-10-21 – 2017-10-23 (×7): 500 mg via ORAL
  Filled 2017-10-21 (×7): qty 1

## 2017-10-21 MED FILL — Clopidogrel Bisulfate Tab 75 MG (Base Equiv): ORAL | Qty: 2 | Status: AC

## 2017-10-21 MED FILL — Clopidogrel Bisulfate Tab 75 MG (Base Equiv): ORAL | Qty: 200 | Status: CN

## 2017-10-21 NOTE — Progress Notes (Addendum)
Advanced Heart Failure Rounding Note  PCP-Cardiologist: No primary care provider on file.   Subjective:     Overnight she developed A fib RVR.   Complaining mild dyspnea. Denies chest pain.   RHC/LHC/PCI 9/19:  Diagnostic Angiogram Dominance: Right  Left Main  Mild disease.  Left Anterior Descending  Subtotal occlusion of the proximal LAD. Patent LIMA-LAD. There were serial 60% stenoses in the mid to distal LAD after LIMA touchdown. The SVG-D was patent.  Left Circumflex  50% proximal and mid LCx stenosis. OM1 with severe diffuse disease proximally. PLOM with diffuse disease. Sequential SVG-OM1 and PLOM was patent with a proximal 75% stenosis (likely also at the site of a venous valve).  Right Coronary Artery  Stented segment in the proximal to mid RCA is patent with 40% in-stent restenosis at the RCA ostium. 50% ostial PDA stenosis. The SVG-RCA is known to be occluded and was not injected.  Intervention   DES to sequential SVG-OM1 and PLOM.  Right Heart   Right Heart Pressures RHC Procedural Findings: Hemodynamics (mmHg) RA mean 11 RV 36/10 PA 34/12, mean 23 PCWP mean 15 AO 116/57  Oxygen saturations: PA 74% AO 97%  Cardiac Output (Fick) 6.61  Cardiac Index (Fick) 2.85      Objective:   Weight Range: 134.7 kg Body mass index is 50.97 kg/m.   Vital Signs:   Temp:  [97.5 F (36.4 C)-98 F (36.7 C)] 97.5 F (36.4 C) (09/20 0734) Pulse Rate:  [62-131] 126 (09/20 0734) Resp:  [12-33] 16 (09/20 0734) BP: (106-183)/(34-110) 151/81 (09/20 0734) SpO2:  [87 %-100 %] 94 % (09/20 0734) Weight:  [134.7 kg] 134.7 kg (09/20 0515) Last BM Date: 10/19/17  Weight change: Filed Weights   10/20/17 0756 10/21/17 0515  Weight: 134.7 kg 134.7 kg    Intake/Output:   Intake/Output Summary (Last 24 hours) at 10/21/2017 0823 Last data filed at 10/21/2017 0650 Gross per 24 hour  Intake 1603.37 ml  Output 2750 ml  Net -1146.63 ml      Physical Exam      General:  No resp difficulty HEENT: Normal Neck: Supple. JVP 11-12 . Carotids 2+ bilat; no bruits. No lymphadenopathy or thyromegaly appreciated. Cor: PMI nondisplaced. Irregular  rate & rhythm. No rubs, gallops or murmurs. Lungs: Clear Abdomen: obese, Soft, nontender, nondistended. No hepatosplenomegaly. No bruits or masses. Good bowel sounds. Extremities: No cyanosis, clubbing, rash, R and LLE 1+ edema Neuro: Alert & orientedx3, cranial nerves grossly intact. moves all 4 extremities w/o difficulty. Affect pleasant   Telemetry   A fib RVR 120s   EKG    A fib RVR 130   Labs    CBC Recent Labs    10/20/17 1631 10/21/17 0512  WBC 7.7 8.8  HGB 14.9 15.6*  HCT 45.7 47.7*  MCV 96.6 96.8  PLT 201 798   Basic Metabolic Panel Recent Labs    10/21/17 0512  NA 139  K 4.3  CL 101  CO2 26  GLUCOSE 219*  BUN 16  CREATININE 1.01*  CALCIUM 9.0   Liver Function Tests No results for input(s): AST, ALT, ALKPHOS, BILITOT, PROT, ALBUMIN in the last 72 hours. No results for input(s): LIPASE, AMYLASE in the last 72 hours. Cardiac Enzymes No results for input(s): CKTOTAL, CKMB, CKMBINDEX, TROPONINI in the last 72 hours.  BNP: BNP (last 3 results) No results for input(s): BNP in the last 8760 hours.  ProBNP (last 3 results) No results for input(s): PROBNP in the last  8760 hours.   D-Dimer No results for input(s): DDIMER in the last 72 hours. Hemoglobin A1C No results for input(s): HGBA1C in the last 72 hours. Fasting Lipid Panel No results for input(s): CHOL, HDL, LDLCALC, TRIG, CHOLHDL, LDLDIRECT in the last 72 hours. Thyroid Function Tests No results for input(s): TSH, T4TOTAL, T3FREE, THYROIDAB in the last 72 hours.  Invalid input(s): FREET3  Other results:   Imaging     No results found.   Medications:     Scheduled Medications: . sodium chloride   Intravenous Once  . apixaban  5 mg Oral BID  . aspirin  81 mg Oral Daily  . clopidogrel  75 mg Oral Q  breakfast  . DULoxetine  30 mg Oral BID  . ezetimibe  10 mg Oral Daily  . gabapentin  600 mg Oral Q breakfast   And  . gabapentin  600 mg Oral Q1200   And  . gabapentin  1,200 mg Oral QHS  . insulin aspart  0-9 Units Subcutaneous TID WC  . metoprolol succinate  25 mg Oral BID  . off the beat book   Does not apply Once  . ranolazine  1,000 mg Oral BID  . rosuvastatin  40 mg Oral q1800  . sodium chloride flush  3 mL Intravenous Q12H     Infusions: . sodium chloride    . sodium chloride 50 mL/hr at 10/20/17 1946  . amiodarone     Followed by  . amiodarone       PRN Medications:  sodium chloride, acetaminophen, diazepam, HYDROcodone-acetaminophen, ondansetron (ZOFRAN) IV, sodium chloride flush, zolpidem    Patient Profile  Admitted after cath . Stent to vein graft distal circumflex.   Assessment/Plan   1. CAD PCI to sequential SVG to LCx system on 9/19.  - restart  toprol xl,  and ranolazine.  -9/19 S/P LHC with stent to vein graft distal circumflex - On plavix + asa.   2. New onset A Fib RVR On diltiazem drip without conversion. Stop diltiazem and start amiodarone drip  Start eliquis 5 mg twice a day  If she does not convert will need cardioversion on Monday  - She was provided education on eliquis.  Case Management consulted for eliquis.   3. HTN Restart Toprol xl, amlodipine, and losartan.  Hold off on clonidine  4. Chronic Diastolic Heart Failure Repeat ECHO  Volume status elevated. Start 80 mg IV lasix twice daily.  Renal function stable  5., OHS,OSA  Intolerant CPAP  6. Morbid Obesity.  Body mass index is 50.97 kg/m.   Cardiac Rehab following.   Length of Stay: 0  Darrick Grinder, NP  10/21/2017, 8:23 AM  Advanced Heart Failure Team Pager [phone] (M-F; 7a - 4p)  Please contact [facility] for night-coverage after hours (4p -7a ) and weekends on [email or web address]  Patient went into atrial fibrillation with RVR this morning.  Interestingly, she felt  chest pressure when she went into atrial fibrillation that is similar to the episodes at home that led to cardiac cath.  She is mildly volume overloaded on exam.   She was started on amiodarone gtt and Eliquis.  She went back into NSR this afternoon, HR 80s with PVCs.   On exam, JVP 9-10 cm.  Regular S1S2.  No edema.  Clear lungs.   1. CAD: Now s/p DES to sequential SVG-OM1/PLOM for angina.  Interestingly, her symptoms may have actually been due to paroxysmal atrial fibrillation (severe chest pressure when she went into  atrial fibrillation this morning).   - Continue ASA 81 x 1 week (stop after 1 week given need for DOAC).  - Continue Plavix at least 6 months.   - Continue Crestor 40 daily and Zetia 10 daily.  She is on Praluent at home.  2. Atrial fibrillation: Went into afib/RVR this morning, developed substernal chest tightness similar to the symptoms we had thought were angina and had led to cath with PCI.  It is possible that her symptoms have actually been due to paroxysmal atrial fibrillation.  Now back in NSR after amiodarone started.  - Continue Eliquis.  As above, will stop ASA after 1 week.  Will need to watch closely for bleeding.  - Continue amiodarone gtt overnight, will transition to po tomorrow.  As her symptoms at home actually seem to be due to paroxysmal atrial fibrillation, will keep her on amiodarone for the time being.  3. Acute/chronic diastolic CHF: Mild CHF exacerbation in setting of afib/RVR.  Mild volume overload.  - Lasix 80 mg IV bid today, probably back to torsemide 80 qam/40 qpm tomorrow.  - Will get echo.  4. HTN: Restart home BP meds.  5. OHS/OSA: Cannot tolerate CPAP.  Uses oxygen with exertion and at night.   I suspect she will be able to go home tomorrow with close followup.   Loralie Champagne 10/21/2017

## 2017-10-21 NOTE — Progress Notes (Signed)
[id number] Did not walk with pt as heart rate elevated. Gave pt Off the Beat booklet and explained atrial fib and need for eliquis. Discussed need for plavix with stent. Gave heart healthy, diabetic and low sodium diets. Referred back to [place] CRP 2. Pt is receiving PT to strengthen knees. Encouraged her to continue this as long as she feels she needs it before considering Phase 2. Reviewed NTG use.  Encouraged watching sodium. Will follow up tomorrow and walk and go over ex ed if heart rate stable. Graylon Good RN BSN 10/21/2017 8:56 AM

## 2017-10-21 NOTE — Progress Notes (Signed)
Pt.noted HR went up to rapid 150-160's;Pt.was sleeping when it happened.V/S taken b/p=158/110;hr=136;r=25O2 sat=97% on 2 lnc.Rapid response nurse called & MD on call Chav[place]arte & both came to see pt.Ordered to start on Cardizem drip.Will continue to monitor pt.

## 2017-10-21 NOTE — Care Management Note (Signed)
Case Management Note  Patient Details  Name: [name] [name] [name]: [id number] Date of Birth: 11/09/1945  Subjective/Objective:  Patient for dc today, she is on plavix, had afib over night and will be on eliquis which is new, NCM informed patient that RN will give her the 30 day free savings card and her refill copay is around 23.00.                    Action/Plan: DC home when ready.  Expected Discharge Date:  10/21/17               Expected Discharge Plan:  Home/Self Care  In-House Referral:     Discharge planning Services  CM Consult, Medication Assistance  Post Acute Care Choice:    Choice offered to:     DME Arranged:    DME Agency:     HH Arranged:    HH Agency:     Status of Service:  Completed, signed off  If discussed at [employer] of Stay Meetings, dates discussed:    Additional Comments:  Zenon Mayo, RN 10/21/2017, 12:24 PM

## 2017-10-21 NOTE — Progress Notes (Signed)
Pt converted to NSR with rate in high 80's. EKG obtained. Pt still complains of chest pressure. SBP 158. PTA antihypertensives administered at 0900. Will continue to monitor.

## 2017-10-21 NOTE — Discharge Instructions (Signed)
Information on my medicine - ELIQUIS® (apixaban) ° °This medication education was reviewed with me or my healthcare representative as part of my discharge preparation.  The pharmacist that spoke with me during my hospital stay was:  Riely Baskett Rhea, RPH ° °Why was Eliquis® prescribed for you? °Eliquis® was prescribed for you to reduce the risk of a blood clot forming that can cause a stroke if you have a medical condition called atrial fibrillation (a type of irregular heartbeat). ° °What do You need to know about Eliquis® ? °Take your Eliquis® TWICE DAILY - one tablet in the morning and one tablet in the evening with or without food. If you have difficulty swallowing the tablet whole please discuss with your pharmacist how to take the medication safely. ° °Take Eliquis® exactly as prescribed by your doctor and DO NOT stop taking Eliquis® without talking to the doctor who prescribed the medication.  Stopping may increase your risk of developing a stroke.  Refill your prescription before you run out. ° °After discharge, you should have regular check-up appointments with your healthcare provider that is prescribing your Eliquis®.  In the future your dose may need to be changed if your kidney function or weight changes by a significant amount or as you get older. ° °What do you do if you miss a dose? °If you miss a dose, take it as soon as you remember on the same day and resume taking twice daily.  Do not take more than one dose of ELIQUIS at the same time to make up a missed dose. ° °Important Safety Information °A possible side effect of Eliquis® is bleeding. You should call your healthcare provider right away if you experience any of the following: °  Bleeding from an injury or your nose that does not stop. °  Unusual colored urine (red or dark brown) or unusual colored stools (red or black). °  Unusual bruising for unknown reasons. °  A serious fall or if you hit your head (even if there is no  bleeding). ° °Some medicines may interact with Eliquis® and might increase your risk of bleeding or clotting while on Eliquis®. To help avoid this, consult your healthcare provider or pharmacist prior to using any new prescription or non-prescription medications, including herbals, vitamins, non-steroidal anti-inflammatory drugs (NSAIDs) and supplements. ° °This website has more information on Eliquis® (apixaban): http://www.eliquis.com/eliquis/home ° °

## 2017-10-21 NOTE — Significant Event (Signed)
Rapid Response Event Note  Overview:  Called by bedside RN stating pt was in SVT.  Initial Focused Assessment: On arrival, pt appeared to be in afib-RVR. A&Ox4, HR 130-150, BP 183/107, RR 20, spO2 97% on 2L [place].   Interventions:  Pads placed, EKG done (afib RVR-new onset). Dr Numidia Lions called and came to bedside. Order received for cardizem bolus and gtt. HR decreased to 120, pt stating she is starting to feel better  Plan of Care (if not transferred): Continue to monitor pt HR and status. Bedside RN instructed to call with any changes or concerns.   Event Summary:  Called at  0445   Arrived at  [place]    Event ended at [place]

## 2017-10-21 NOTE — Care Management (Signed)
10-21-17  BENEFIT CHECK :  # 2.  S/W DENDERDE  @ [employer] [facility]  # [phone]  ELIQUIS  5 MG BID COVER- YES CO-PAY- $ 23.96 TIER- 3 DRUG PRIOR APPROVAL- NO  APIXABAN : NONE FORMULARY  PREFERRED PHARMACY :  YES    [facility]

## 2017-10-21 NOTE — Progress Notes (Cosign Needed)
   Remains in A fib RVR. Move to [place].   Stop diltiazem and start amiodarone drip. Start eliqus 5 mg twice a day.   Restart Toprol XL 25 mg twice a day.   Discussed with Dr [name].   Kasara Schomer NP-C  7:53 AM

## 2017-10-22 DIAGNOSIS — I2510 Atherosclerotic heart disease of native coronary artery without angina pectoris: Secondary | ICD-10-CM

## 2017-10-22 DIAGNOSIS — I5032 Chronic diastolic (congestive) heart failure: Secondary | ICD-10-CM

## 2017-10-22 DIAGNOSIS — I4891 Unspecified atrial fibrillation: Secondary | ICD-10-CM

## 2017-10-22 DIAGNOSIS — I10 Essential (primary) hypertension: Secondary | ICD-10-CM

## 2017-10-22 LAB — GLUCOSE, CAPILLARY
Glucose-Capillary: 271 mg/dL — ABNORMAL HIGH (ref 70–99)
Glucose-Capillary: 305 mg/dL — ABNORMAL HIGH (ref 70–99)
Glucose-Capillary: 235 mg/dL — ABNORMAL HIGH (ref 70–99)
Glucose-Capillary: 238 mg/dL — ABNORMAL HIGH (ref 70–99)

## 2017-10-22 LAB — BASIC METABOLIC PANEL
Anion gap: 13 (ref 5–15)
BUN: 20 mg/dL (ref 8–23)
CO2: 31 mmol/L (ref 22–32)
Calcium: 8.6 mg/dL — ABNORMAL LOW (ref 8.9–10.3)
Chloride: 94 mmol/L — ABNORMAL LOW (ref 98–111)
Creatinine, Ser: 1.4 mg/dL — ABNORMAL HIGH (ref 0.44–1.00)
GFR calc Af Amer: 43 mL/min — ABNORMAL LOW (ref >60)
GFR calc non Af Amer: 37 mL/min — ABNORMAL LOW (ref >60)
Glucose, Bld: 299 mg/dL — ABNORMAL HIGH (ref 70–99)
Potassium: 4.1 mmol/L (ref 3.5–5.1)
Sodium: 138 mmol/L (ref 135–145)

## 2017-10-22 LAB — MAGNESIUM: Magnesium: 1.8 mg/dL (ref 1.7–2.4)

## 2017-10-22 MED ORDER — AMIODARONE HCL 200 MG PO TABS
400.0000 mg | ORAL_TABLET | Freq: Two times a day (BID) | ORAL | Status: DC
  Administered 2017-10-22 – 2017-10-23 (×3): 400 mg via ORAL
  Filled 2017-10-22 (×3): qty 2

## 2017-10-22 NOTE — Plan of Care (Signed)
  Problem: Activity: Goal: Ability to tolerate increased activity will improve Outcome: Progressing   

## 2017-10-22 NOTE — Progress Notes (Addendum)
Advanced Heart Failure Rounding Note  PCP-Cardiologist: No primary care provider on file.   Subjective:   Patient breathing a little better   Able to eat today   NOt nauseated   RHC/LHC/PCI 9/19:  Diagnostic Angiogram Dominance: Right  Left Main  Mild disease.  Left Anterior Descending  Subtotal occlusion of the proximal LAD. Patent LIMA-LAD. There were serial 60% stenoses in the mid to distal LAD after LIMA touchdown. The SVG-D was patent.  Left Circumflex  50% proximal and mid LCx stenosis. OM1 with severe diffuse disease proximally. PLOM with diffuse disease. Sequential SVG-OM1 and PLOM was patent with a proximal 75% stenosis (likely also at the site of a venous valve).  Right Coronary Artery  Stented segment in the proximal to mid RCA is patent with 40% in-stent restenosis at the RCA ostium. 50% ostial PDA stenosis. The SVG-RCA is known to be occluded and was not injected.  Intervention   DES to sequential SVG-OM1 and PLOM.  Right Heart   Right Heart Pressures RHC Procedural Findings: Hemodynamics (mmHg) RA mean 11 RV 36/10 PA 34/12, mean 23 PCWP mean 15 AO 116/57  Oxygen saturations: PA 74% AO 97%  Cardiac Output (Fick) 6.61  Cardiac Index (Fick) 2.85      Objective:   Weight Range: 130.7 kg Body mass index is 49.45 kg/m.   Vital Signs:   Temp:  [97.7 F (36.5 C)-98.9 F (37.2 C)] 97.8 F (36.6 C) (09/21 0733) Pulse Rate:  [61-88] 61 (09/21 0733) Resp:  [14-23] 17 (09/21 0733) BP: (119-171)/(52-89) 148/59 (09/21 0733) SpO2:  [90 %-99 %] 96 % (09/21 0733) Weight:  [130.7 kg] 130.7 kg (09/21 0500) Last BM Date: 10/21/17  Weight change: Filed Weights   10/20/17 0756 10/21/17 0515 10/22/17 0500  Weight: 134.7 kg 134.7 kg 130.7 kg    Intake/Output:   Intake/Output Summary (Last 24 hours) at 10/22/2017 0750 Last data filed at 10/22/2017 0600 Gross per 24 hour  Intake 719.59 ml  Output 1800 ml  Net -1080.41 ml      Physical Exam      General:  No resp difficulty HEENT: Normal Neck: Supple. JVP 10 . Carotids 2+ bilat; no bruits. No lymphadenopathy or thyromegaly appreciated. Cor: PMI nondisplaced. RRR  No S3    No rubs, gallops or murmurs. Lungs: Clear Abdomen: obese, Soft, nontender, nondistended. No hepatosplenomegaly. No bruits or masses. Good bowel sounds. Extremities: No cyanosis, clubbing, rash, R and LLE 1+ edema Neuro: Alert & orientedx3, cranial nerves grossly intact. moves all 4 extremities w/o difficulty. Affect pleasant   Telemetry   SR 70  Personally reviewed    EKG      Labs    CBC Recent Labs    10/20/17 1631 10/21/17 0512  WBC 7.7 8.8  HGB 14.9 15.6*  HCT 45.7 47.7*  MCV 96.6 96.8  PLT 201 390   Basic Metabolic Panel Recent Labs    10/21/17 0512 10/21/17 0903 10/22/17 0358  NA 139  --  138  K 4.3  --  4.1  CL 101  --  94*  CO2 26  --  31  GLUCOSE 219*  --  299*  BUN 16  --  20  CREATININE 1.01*  --  1.40*  CALCIUM 9.0  --  8.6*  MG  --  1.9 1.8   Liver Function Tests No results for input(s): AST, ALT, ALKPHOS, BILITOT, PROT, ALBUMIN in the last 72 hours. No results for input(s): LIPASE, AMYLASE in the last  72 hours. Cardiac Enzymes No results for input(s): CKTOTAL, CKMB, CKMBINDEX, TROPONINI in the last 72 hours.  BNP: BNP (last 3 results) No results for input(s): BNP in the last 8760 hours.  ProBNP (last 3 results) No results for input(s): PROBNP in the last 8760 hours.   D-Dimer No results for input(s): DDIMER in the last 72 hours. Hemoglobin A1C Recent Labs    10/21/17 0512  HGBA1C 8.0*   Fasting Lipid Panel Recent Labs    10/21/17 0903  CHOL 181  HDL 42  LDLCALC 108*  TRIG 157*  CHOLHDL 4.3   Thyroid Function Tests Recent Labs    10/21/17 0903  TSH 0.905    Other results:   Imaging    No results found.   Medications:     Scheduled Medications: . sodium chloride   Intravenous Once  . ALPRAZolam  0.5 mg Oral QAC breakfast  .  ALPRAZolam  0.5 mg Oral Q1200  . ALPRAZolam  1 mg Oral QHS  . amLODipine  10 mg Oral Daily  . apixaban  5 mg Oral BID  . aspirin  81 mg Oral Daily  . clopidogrel  75 mg Oral Q breakfast  . DULoxetine  30 mg Oral BID  . ezetimibe  10 mg Oral Daily  . furosemide  80 mg Intravenous BID  . gabapentin  600 mg Oral Q breakfast   And  . gabapentin  600 mg Oral Q1200   And  . gabapentin  1,200 mg Oral QHS  . insulin aspart  0-9 Units Subcutaneous TID WC  . insulin glargine  30 Units Subcutaneous QHS  . losartan  100 mg Oral Daily  . methocarbamol  500 mg Oral TID  . metoprolol succinate  25 mg Oral BID  . pantoprazole  40 mg Oral Daily  . potassium chloride  20 mEq Oral BID  . ranolazine  1,000 mg Oral BID  . rosuvastatin  40 mg Oral q1800  . sodium chloride flush  3 mL Intravenous Q12H    Infusions: . sodium chloride    . amiodarone 30 mg/hr (10/21/17 2225)    PRN Medications: sodium chloride, acetaminophen, diazepam, HYDROcodone-acetaminophen, ondansetron (ZOFRAN) IV, sodium chloride flush, zolpidem    Patient Profile  Admitted after cath . Stent to vein graft distal circumflex.   Assessment/Plan   1. CAD PCI to sequential SVG to LCx system on 9/19.  - restart  toprol xl,  and ranolazine.  -9/19 S/P LHC with stent to vein graft distal circumflex - On plavix + asa.   2. New onset A Fib RVR COnverted to SR on IV amiodarone   WIll transition to PO amiodarone Start eliquis 5 mg twice a day  Case management to see re Eliquis coverage    3. HTN Restart Toprol xl, amlodipine, and losartan.  Hold off on clonidine   Stop losartan with bump in cr   Wait til diuresed   4. Chronic Diastolic Heart Failure Repeat ECHO  Diuresing   Fluid is still increased  COntinue  IV diuresis   I wOUld not transition to PO now  Cr up but will hold losartan    Follow   5.  REnal   Cr bumped to 1.4   ARB added yesterday along with lasix Would stop Losartan  COntinue lasix  Follow  6 ,  OHS,OSA  Intolerant CPAP  6. Morbid Obesity.  Body mass index is 49.45 kg/m.  8  HL  ON praluant at home  COntinue  Zetia and statin  Cardiac Rehab following.   Length of Stay: 1  Dorris Carnes, MD  10/22/2017, 7:50 AM

## 2017-10-22 NOTE — Progress Notes (Signed)
CARDIAC REHAB PHASE I   PRE:  Rate/Rhythm: Sinus Rhtyhm  BP:  Supine: 151/65     SaO2: 935 1l/min  MODE:  Ambulation: 15 ft   POST:  Rate/Rhythem: Sinus Rhythm 67  BP:  Supine: 123/56      SaO2: 95% 2l/min [phone] patient walked to bathroom then into the hallway a few steps using rolling walker. Patient complained of feeling lightheaded. Assisted back to bed with call bell within reach. [name] [name] would benefit from home physical therapy. [name] [name] says she has not been able to do much walking at home due to trouble with her knee's and deconditioning. [name] [name] says she uses a scooter when she goes out with her husband's assistance. [name] [name] says her symptoms improved once she got back to bed.Barnet Pall, RN,BSN 10/22/2017 9:27 AM  [place] Gave

## 2017-10-23 LAB — BASIC METABOLIC PANEL
Anion gap: 13 (ref 5–15)
BUN: 31 mg/dL — ABNORMAL HIGH (ref 8–23)
CO2: 33 mmol/L — ABNORMAL HIGH (ref 22–32)
Calcium: 8.8 mg/dL — ABNORMAL LOW (ref 8.9–10.3)
Chloride: 94 mmol/L — ABNORMAL LOW (ref 98–111)
Creatinine, Ser: 1.57 mg/dL — ABNORMAL HIGH (ref 0.44–1.00)
GFR calc Af Amer: 38 mL/min — ABNORMAL LOW (ref >60)
GFR calc non Af Amer: 33 mL/min — ABNORMAL LOW (ref >60)
Glucose, Bld: 285 mg/dL — ABNORMAL HIGH (ref 70–99)
Potassium: 4.2 mmol/L (ref 3.5–5.1)
Sodium: 140 mmol/L (ref 135–145)

## 2017-10-23 LAB — GLUCOSE, CAPILLARY
Glucose-Capillary: 240 mg/dL — ABNORMAL HIGH (ref 70–99)
Glucose-Capillary: 283 mg/dL — ABNORMAL HIGH (ref 70–99)

## 2017-10-23 MED ORDER — POTASSIUM CHLORIDE CRYS ER 20 MEQ PO TBCR: 20.0000 meq | EXTENDED_RELEASE_TABLET | Freq: Two times a day (BID) | ORAL | 1 refills | Status: DC

## 2017-10-23 MED ORDER — AMIODARONE HCL 200 MG PO TABS: ORAL_TABLET | ORAL | 1 refills | Status: DC

## 2017-10-23 MED ORDER — LOSARTAN POTASSIUM 25 MG PO TABS: 25.0000 mg | ORAL_TABLET | Freq: Every day | ORAL | 0 refills | Status: DC

## 2017-10-23 MED ORDER — AMIODARONE HCL 200 MG PO TABS: 200.0000 mg | ORAL_TABLET | Freq: Two times a day (BID) | ORAL | Status: DC

## 2017-10-23 MED ORDER — ASPIRIN EC 81 MG PO TBEC: 81.0000 mg | DELAYED_RELEASE_TABLET | Freq: Every day | ORAL | Status: DC

## 2017-10-23 MED ORDER — APIXABAN 5 MG PO TABS: 5.0000 mg | ORAL_TABLET | Freq: Two times a day (BID) | ORAL | 2 refills | Status: DC

## 2017-10-23 NOTE — Discharge Summary (Addendum)
Discharge Summary    Patient ID: [name] [name],  [name]: [id number], DOB/AGE: [birth year] 72 y.o.  Admit date: 10/20/2017 Discharge date: 10/23/2017  Primary Care Provider: Derinda Late Primary Cardiologist: Dr. [name]  Discharge Diagnoses    Active Problems:   Coronary artery disease of bypass graft of native heart with stable angina pectoris [facility])   CAD (coronary artery disease) of bypass graft   Atrial fibrillation (HCC)   Allergies Allergies  Allergen Reactions  . Amoxicillin Shortness Of Breath and Rash  . Brilinta [Ticagrelor] Shortness Of Breath  . Erythromycin Shortness Of Breath, Other (See Comments) and Hives    Trouble swallowing  . Flagyl [Metronidazole] Palpitations and Shortness Of Breath  . Penicillins Hives, Shortness Of Breath, Rash and Other (See Comments)    Has patient had a PCN reaction causing immediate rash, facial/tongue/throat swelling, SOB or lightheadedness with hypotension: Yes Has patient had a PCN reaction causing severe rash involving mucus membranes or skin necrosis: No Has patient had a PCN reaction that required hospitalization: Yes Has patient had a PCN reaction occurring within the last 10 years: No If all of the above answers are "NO", then may proceed with Cephalosporin use.   . Isosorbide Mononitrate [Isosorbide Nitrate] Other (See Comments)    Joint aches, muscles hurt, difficult to walk   . Jardiance [Empagliflozin] Other (See Comments)    Nausea, joint aches, muscles aches  . Metformin And Related Other (See Comments)    Stomach pain, cold sweats, joint pain, burred vision, dizziness  . Tape Other (See Comments)    Skin pulls off with certain types Plastic tape causes skin to rip if left on for long periods of time  . Erythromycin Base Rash    Diagnostic Studies/Procedures    Cath: 10/20/17  Successful direct stenting of the proximal 70% smooth  eccentric saphenous vein graft stenosis sequentially supplying to distal  circumflex marginal vessels with insertion of a 3.5 x 18 mm Xience Sierra DES stent postdilated to 3.8 mm with the stenosis being reduced to 0%.  There was brisk TIMI-3 flow and no evidence for dissection.  RECOMMENDATION: Medical therapy for the patient's concomitant CAD and chronic diastolic heart failure. Recommend uninterrupted dual antiplatelet therapy with Aspirin 81mg  daily and Clopidogrel 75mg  daily for a minimum of 12 months. _____________   History of Present Illness     72 y.o. with history of CAD s/p CABG, diastolic CHF, and cerebrovascular disease with history of CVA presented for followup of CHF and diastolic CHF. She had CABG x 5 in 12/11.  Prior to the CABG she had multiple PCIs.  She had a CVA in 2/14 that presented as visual loss.  She had a left carotid stent in 2/15.   Left heart cath done in Sept. 2014. This showed patent SVG-D, LIMA-LAD, and sequential SVG-OM branches but SVG-RCA and the native RCA were both totally occluded. It was felt aht her increased symptoms coincided with occlusion of SVG-RCA.  She was started on Imdur to see if this would help with the chest pain and dyspnea.  However, she felt like Imdur caused leg cramps and did not think that she could take it.  So ranolazine 500 mg bid was started and titrated up to 1000 mg bid.  This helped some but not markedly.  Therefore, sent to  Dr. Irish Lack to address opening RCA CTO. He was able to do this in 5/15 with 4 overlapping Xience DES.  This led to resolution of exertional  chest pain.    [name] [name] was started on Brilinta after PCI.  She became much more short of breath after starting on Brilinta. Per Dr Delano Metz stopped and replaced with Plavix.  Dyspnea improved off Brilinta. Given increasing exertional dyspnea and chest pain,  RHC/LHC was done in 4/17.  This showed stable CAD with no interventional target.  Left and right heart filling pressures were not significantly increased.  Medical management.   She  had an MRI/MRA in May 2018 that showed moderately severe stenosis of the supraclinoid segment of the right internal carotid artery, progressed since the 2016 MR angiogram and severe basilar artery stenosis.   She developed recurrent exertional chest pain and had LHC in 6/18, showing 4/5 grafts patent with patent native RCA (similar to prior cath, no changes).    Echo 1/19 with EF 55-60%, moderate diastolic dysfunction, PASP 50 mmHg, mildly dilated RV, mild AS, mild-moderate MR.    She returned to the clinic on 9/12 for HF follow up. Overall feeling fine. She remained SOB with exertion but this is her baseline. Denies PND/orthopnea. She notices chest pain if she was climbing steps. She had chest tightness when she was carrying items. Appetite ok. No fever or chills. Uses oxygen at night.  Weight at home  292-296 pounds. Taking all medications. Had a cardiolite done 9/10 that was abnormal for ischemia. Case discussed with Dr. [name] and decision was made to send for cardiac cath.   Hospital Course     1. CAD PCI to sequential SVG to LCx system on 9/19.  - restarted  toprol xl,  and ranolazine.  -9/19 S/P LHC with stent to vein graft distal circumflex - On plavix + asa with plans to stop aspirin after 1 wk total.    2. New onset A Fib RVR this admit   COnverted to SR on IV amiodarone   Switch now to 200 bid for 2 wk after admission, then 200 daily Started eliquis 5 mg twice a day  Case management saw re Eliquis coverage    3. HTN Continue b blocker and amlodipine    Hold off on clonidine   Will resume losartan at 25mg  daily given rise in Cr prior to discharge.   4. Chronic Diastolic Heart Failure Volume status is much improved. Discussed with D [name] and will resume home dose of torsemide the day following discharge as she received IV lasix on discharge day.  5.  Renal: labs stable  Send home on torsemide 80 am and 40 pm.  6. OHS,OSA:  Does not tolerate  CPAP  6. Morbid  Obesity: Body mass index is 49.01 kg/m.  8  HL: On praluant at home.  Continue Zetia and statin  [name] was seen by Dr. Harrington Challenger and determined stable for discharge home. Follow up in the office has been arranged. Medications are listed below. Message sent to the AHF clinic to arrange for follow up.   _____________  Discharge Vitals Blood pressure (!) 165/61, pulse 63, temperature (!) 97.5 F (36.4 C), temperature source Oral, resp. rate 18, height 5\' 4"  (1.626 m), weight 129.5 kg, SpO2 91 %.  Filed Weights   10/21/17 0515 10/22/17 0500 10/23/17 0408  Weight: 134.7 kg 130.7 kg 129.5 kg    Labs & Radiologic Studies    CBC Recent Labs    10/20/17 1631 10/21/17 0512  WBC 7.7 8.8  HGB 14.9 15.6*  HCT 45.7 47.7*  MCV 96.6 96.8  PLT 201 196  Basic Metabolic Panel Recent Labs    10/21/17 0903 10/22/17 0358 10/23/17 1220  NA  --  138 140  K  --  4.1 4.2  CL  --  94* 94*  CO2  --  31 33*  GLUCOSE  --  299* 285*  BUN  --  20 31*  CREATININE  --  1.40* 1.57*  CALCIUM  --  8.6* 8.8*  MG 1.9 1.8  --    Liver Function Tests No results for input(s): AST, ALT, ALKPHOS, BILITOT, PROT, ALBUMIN in the last 72 hours. No results for input(s): LIPASE, AMYLASE in the last 72 hours. Cardiac Enzymes No results for input(s): CKTOTAL, CKMB, CKMBINDEX, TROPONINI in the last 72 hours. BNP Invalid input(s): POCBNP D-Dimer No results for input(s): DDIMER in the last 72 hours. Hemoglobin A1C Recent Labs    10/21/17 0512  HGBA1C 8.0*   Fasting Lipid Panel Recent Labs    10/21/17 0903  CHOL 181  HDL 42  LDLCALC 108*  TRIG 157*  CHOLHDL 4.3   Thyroid Function Tests Recent Labs    10/21/17 0903  TSH 0.905   _____________  No results found. Disposition   Pt is being discharged home today in good condition.  Follow-up Plans & Appointments    Follow-up Information    [facility]. Go on 11/03/2017.   Specialty:   Cardiology Why:  11:30 AM, [facility], parking code [id number] Contact information: [address] [id number] [place] [phone]         Discharge Instructions    Amb Referral to Cardiac Rehabilitation   Complete by:  As directed    Diagnosis:  Coronary Stents   Diet - low sodium heart healthy   Complete by:  As directed    Discharge instructions   Complete by:  As directed    Groin Site Care Refer to this sheet in the next few weeks. These instructions provide you with information on caring for yourself after your procedure. Your caregiver may also give you more specific instructions. Your treatment has been planned according to current medical practices, but problems sometimes occur. Call your caregiver if you have any problems or questions after your procedure. HOME CARE INSTRUCTIONS You may shower 24 hours after the procedure. Remove the bandage (dressing) and gently wash the site with plain soap and water. Gently pat the site dry.  Do not apply powder or lotion to the site.  Do not sit in a bathtub, swimming pool, or whirlpool for 5 to 7 days.  No bending, squatting, or lifting anything over 10 pounds (4.5 kg) as directed by your caregiver.  Inspect the site at least twice daily.  Do not drive home if you are discharged the same day of the procedure. Have someone else drive you.  You may drive 24 hours after the procedure unless otherwise instructed by your caregiver.  What to expect: Any bruising will usually fade within 1 to 2 weeks.  Blood that collects in the tissue (hematoma) may be painful to the touch. It should usually decrease in size and tenderness within 1 to 2 weeks.  SEEK IMMEDIATE MEDICAL CARE IF: You have unusual pain at the groin site or down the affected leg.  You have redness, warmth, swelling, or pain at the groin site.  You have drainage (other than a small amount of blood on the dressing).  You have  chills.  You have a fever or  persistent symptoms for more than 72 hours.  You have a fever and your symptoms suddenly get worse.  Your leg becomes pale, cool, tingly, or numb.  You have heavy bleeding from the site. Hold pressure on the site. .  You will be on triple therapy with aspirin, plavix and eliquis for one week, then plan to stop aspirin but continue plavix and eliquis. Please monitor for signs and symptoms of bleeding with these medications.   Increase activity slowly   Complete by:  As directed       Discharge Medications     Medication List    STOP taking these medications   cloNIDine 0.2 MG tablet Commonly known as:  CATAPRES   triamterene-hydrochlorothiazide 37.5-25 MG capsule Commonly known as:  DYAZIDE     TAKE these medications   acetaminophen 500 MG tablet Commonly known as:  TYLENOL Take 1,000 mg by mouth every 6 (six) hours as needed for mild pain or headache.   ADVAIR DISKUS 250-50 MCG/DOSE Aepb Generic drug:  Fluticasone-Salmeterol Inhale 1 puff into the lungs 2 (two) times daily.   albuterol (2.5 MG/3ML) 0.083% nebulizer solution Commonly known as:  PROVENTIL Take 2.5 mg by nebulization every 6 (six) hours as needed for wheezing or shortness of breath.   albuterol 90 MCG/ACT inhaler Commonly known as:  PROVENTIL,VENTOLIN Inhale 2 puffs into the lungs every 4 (four) hours as needed for wheezing or shortness of breath.   Alirocumab 150 MG/ML Sopn Inject 1 pen into the skin every 14 (fourteen) days.   ALPRAZolam 0.5 MG tablet Commonly known as:  XANAX Take 0.5-1 mg by mouth See admin instructions. Take 0.5 mg by mouth in the morning and take 0.5 mg by mouth at noon. Take 1 mg by mouth at bedtime.   AMBIEN 10 MG tablet Generic drug:  zolpidem Take 10 mg by mouth at bedtime.   amiodarone 200 MG tablet Commonly known as:  PACERONE Take 1 tablet twice a day for 2 weeks, then reduce to 1 tablet daily.   amLODipine 10 MG tablet Commonly known  as:  NORVASC Take 10 mg by mouth at bedtime.   apixaban 5 MG Tabs tablet Commonly known as:  ELIQUIS Take 1 tablet (5 mg total) by mouth 2 (two) times daily.   aspirin EC 81 MG tablet Take 1 tablet (81 mg total) by mouth daily. What changed:  how much to take   b complex vitamins capsule Take 1 capsule by mouth daily.   calcium carbonate 1500 (600 Ca) MG Tabs tablet Commonly known as:  OSCAL Take 1,500 mg by mouth daily with breakfast.   clopidogrel 75 MG tablet Commonly known as:  PLAVIX TAKE ONE (1) TABLET EACH DAY What changed:  See the new instructions.   denosumab 60 MG/ML Soln injection Commonly known as:  PROLIA Inject 60 mg into the skin every 6 (six) months. Administer in upper arm, thigh, or abdomen   DULoxetine 30 MG capsule Commonly known as:  CYMBALTA Take 1 capsule (30 mg total) by mouth 2 (two) times daily. What changed:    how much to take  when to take this   ezetimibe 10 MG tablet Commonly known as:  ZETIA TAKE ONE (1) TABLET EACH DAY What changed:  See the new instructions.   gabapentin 600 MG tablet Commonly known as:  NEURONTIN Take 600-1,200 mg by mouth See admin instructions. Take 600 mg by mouth in the morning and take 600 mg by mouth in the afternoon. Take 1200  mg by mouth at bedtime.   HUMALOG KWIKPEN 100 UNIT/ML KiwkPen Generic drug:  insulin lispro Inject 22-26 Units into the skin 3 (three) times daily before meals. Per sliding scale   HYDROcodone-acetaminophen 5-325 MG tablet Commonly known as:  NORCO/VICODIN Take 1 tablet by mouth at bedtime.   ipratropium-albuterol 0.5-2.5 (3) MG/3ML Soln Commonly known as:  DUONEB Take 3 mLs by nebulization every 4 (four) hours as needed (for severe shortness of breath or wheezing).   LANTUS SOLOSTAR 100 UNIT/ML Solostar Pen Generic drug:  Insulin Glargine Inject 54 Units into the skin every morning.   loperamide 2 MG capsule Commonly known as:  IMODIUM Take 2 mg by mouth as needed for  diarrhea or loose stools.   losartan 25 MG tablet Commonly known as:  COZAAR Take 1 tablet (25 mg total) by mouth daily with breakfast. What changed:    medication strength  how much to take   meclizine 12.5 MG tablet Commonly known as:  ANTIVERT Take 12.5 mg by mouth 3 (three) times daily as needed for dizziness.   methocarbamol 500 MG tablet Commonly known as:  ROBAXIN TAKE ONE (1) TABLET THREE (3) TIMES EACH DAY What changed:    how much to take  how to take this  when to take this  additional instructions   metolazone 2.5 MG tablet Commonly known as:  ZAROXOLYN Take 1 tablet (2.5 mg total) by mouth once a week. Take 1 Tablet once weekly on Mondays. What changed:    when to take this  additional instructions   metoprolol succinate 25 MG 24 hr tablet Commonly known as:  TOPROL-XL TAKE ONE TABLET TWICE DAILY   multivitamin capsule Take 1 capsule by mouth daily.   multivitamin-lutein Caps capsule Take 1 capsule by mouth daily.   nitroGLYCERIN 0.3 MG SL tablet Commonly known as:  NITROSTAT Place 1 tablet (0.3 mg total) under the tongue every 5 (five) minutes x 3 doses as needed for chest pain.   ondansetron 4 MG tablet Commonly known as:  ZOFRAN Take 1 tablet (4 mg total) by mouth 2 (two) times daily as needed. For nausea What changed:  when to take this   OXYGEN Inhale 2 L into the lungs at bedtime.   pantoprazole 40 MG tablet Commonly known as:  PROTONIX Take 1 tablet (40 mg total) by mouth 2 (two) times daily.   potassium chloride SA 20 MEQ tablet Commonly known as:  K-DUR,KLOR-CON Take 1 tablet (20 mEq total) by mouth 2 (two) times daily.   RANEXA 1000 MG SR tablet Generic drug:  ranolazine TAKE ONE (1) TABLET BY MOUTH TWO (2) TIMES DAILY What changed:  See the new instructions.   rosuvastatin 40 MG tablet Commonly known as:  CRESTOR TAKE ONE (1) TABLET EACH DAY What changed:  See the new instructions.   torsemide 20 MG tablet Commonly  known as:  DEMADEX Take 4 tablets (80 mg total) by mouth every morning AND 2 tablets (40 mg total) every evening.   triamcinolone cream 0.1 % Commonly known as:  KENALOG Apply 1 application topically 2 (two) times daily.   vitamin B-12 1000 MCG tablet Commonly known as:  CYANOCOBALAMIN Take 1,000 mcg by mouth 3 (three) times a week.   Vitamin D 2000 units tablet Take 2,000 Units by mouth daily with breakfast.   zinc gluconate 50 MG tablet Take 50 mg by mouth 3 (three) times a week.        Acute coronary syndrome (MI, NSTEMI, STEMI,  etc) this admission?: No.     Outstanding Labs/Studies   BMET at follow up appt.   Duration of Discharge Encounter   Greater than 30 minutes including physician time.  Signed, Reino Bellis NP-C 10/23/2017, 1:23 PM

## 2017-10-23 NOTE — Progress Notes (Addendum)
PCP-Cardiologist:[place]     PT feels good   No CP    Subjective:   Patient breathing a little better   Able to eat today   NOt nauseated   RHC/LHC/PCI 9/19:  Diagnostic Angiogram Dominance: Right  Left Main  Mild disease.  Left Anterior Descending  Subtotal occlusion of the proximal LAD. Patent LIMA-LAD. There were serial 60% stenoses in the mid to distal LAD after LIMA touchdown. The SVG-D was patent.  Left Circumflex  50% proximal and mid LCx stenosis. OM1 with severe diffuse disease proximally. PLOM with diffuse disease. Sequential SVG-OM1 and PLOM was patent with a proximal 75% stenosis (likely also at the site of a venous valve).  Right Coronary Artery  Stented segment in the proximal to mid RCA is patent with 40% in-stent restenosis at the RCA ostium. 50% ostial PDA stenosis. The SVG-RCA is known to be occluded and was not injected.  Intervention   DES to sequential SVG-OM1 and PLOM.  Right Heart   Right Heart Pressures RHC Procedural Findings: Hemodynamics (mmHg) RA mean 11 RV 36/10 PA 34/12, mean 23 PCWP mean 15 AO 116/57  Oxygen saturations: PA 74% AO 97%  Cardiac Output (Fick) 6.61  Cardiac Index (Fick) 2.85      Objective:   Weight Range: 129.5 kg Body mass index is 49.01 kg/m.   Vital Signs:   Temp:  [97.8 F (36.6 C)-98.6 F (37 C)] 98 F (36.7 C) (09/22 0408) Pulse Rate:  [58-68] 58 (09/22 0408) Resp:  [15-18] 18 (09/22 0408) BP: (97-159)/(42-87) 149/63 (09/22 0408) SpO2:  [94 %-98 %] 98 % (09/22 0408) Weight:  [129.5 kg] 129.5 kg (09/22 0408) Last BM Date: 10/21/17  Weight change: Filed Weights   10/21/17 0515 10/22/17 0500 10/23/17 0408  Weight: 134.7 kg 130.7 kg 129.5 kg    Intake/Output:   Intake/Output Summary (Last 24 hours) at 10/23/2017 0702 Last data filed at 10/23/2017 0300 Gross per 24 hour  Intake 1080 ml  Output 1625 ml  Net -545 ml      Physical Exam    General:  No resp difficulty HEENT: Normal Neck:  Supple. JVP normal   . Carotids 2+ bilat; no bruits. No lymphadenopathy or thyromegaly appreciated. Cor: PMI nondisplaced. RRR  No S3    No rubs, gallops or murmurs. Lungs: Clear Abdomen: obese, Soft, nontender, nondistended. No hepatosplenomegaly. No bruits or masses. Good bowel sounds. Extremities: No cyanosis, clubbing, rash, No edema Neuro: Alert & orientedx3, cranial nerves grossly intact. moves all 4 extremities w/o difficulty. Affect pleasant   Telemetry   SR with occasional PVCs    Personally reviewed    EKG      Labs    CBC Recent Labs    10/20/17 1631 10/21/17 0512  WBC 7.7 8.8  HGB 14.9 15.6*  HCT 45.7 47.7*  MCV 96.6 96.8  PLT 201 267   Basic Metabolic Panel Recent Labs    10/21/17 0512 10/21/17 0903 10/22/17 0358  NA 139  --  138  K 4.3  --  4.1  CL 101  --  94*  CO2 26  --  31  GLUCOSE 219*  --  299*  BUN 16  --  20  CREATININE 1.01*  --  1.40*  CALCIUM 9.0  --  8.6*  MG  --  1.9 1.8   Liver Function Tests No results for input(s): AST, ALT, ALKPHOS, BILITOT, PROT, ALBUMIN in the last 72 hours. No results for input(s): LIPASE, AMYLASE  in the last 72 hours. Cardiac Enzymes No results for input(s): CKTOTAL, CKMB, CKMBINDEX, TROPONINI in the last 72 hours.  BNP: BNP (last 3 results) No results for input(s): BNP in the last 8760 hours.  ProBNP (last 3 results) No results for input(s): PROBNP in the last 8760 hours.   D-Dimer No results for input(s): DDIMER in the last 72 hours. Hemoglobin A1C Recent Labs    10/21/17 0512  HGBA1C 8.0*   Fasting Lipid Panel Recent Labs    10/21/17 0903  CHOL 181  HDL 42  LDLCALC 108*  TRIG 157*  CHOLHDL 4.3   Thyroid Function Tests Recent Labs    10/21/17 0903  TSH 0.905    Other results:   Imaging    No results found.   Medications:     Scheduled Medications: . sodium chloride   Intravenous Once  . ALPRAZolam  0.5 mg Oral QAC breakfast  . ALPRAZolam  0.5 mg Oral Q1200  .  ALPRAZolam  1 mg Oral QHS  . amiodarone  400 mg Oral BID  . amLODipine  10 mg Oral Daily  . apixaban  5 mg Oral BID  . aspirin  81 mg Oral Daily  . clopidogrel  75 mg Oral Q breakfast  . DULoxetine  30 mg Oral BID  . ezetimibe  10 mg Oral Daily  . furosemide  80 mg Intravenous BID  . gabapentin  600 mg Oral Q breakfast   And  . gabapentin  600 mg Oral Q1200   And  . gabapentin  1,200 mg Oral QHS  . insulin aspart  0-9 Units Subcutaneous TID WC  . insulin glargine  30 Units Subcutaneous QHS  . methocarbamol  500 mg Oral TID  . metoprolol succinate  25 mg Oral BID  . pantoprazole  40 mg Oral Daily  . potassium chloride  20 mEq Oral BID  . ranolazine  1,000 mg Oral BID  . rosuvastatin  40 mg Oral q1800  . sodium chloride flush  3 mL Intravenous Q12H    Infusions: . sodium chloride      PRN Medications: sodium chloride, acetaminophen, diazepam, HYDROcodone-acetaminophen, ondansetron (ZOFRAN) IV, sodium chloride flush, zolpidem    Patient Profile  Admitted after cath . Stent to vein graft distal circumflex.   Assessment/Plan   1. CAD PCI to sequential SVG to LCx system on 9/19.  - restart  toprol xl,  and ranolazine.  -9/19 S/P LHC with stent to vein graft distal circumflex - On plavix + asa.    Off aspirin after 1 wk total    2. New onset A Fib RVR this admit  COnverted to SR on IV amiodarone   Switch now to 200 bid for 2 wk then 200 daily/   Start eliquis 5 mg twice a day  Case management to see re Eliquis coverage    3. HTN Continue b blocker and amlodipine    Hold off on clonidine   Will resume losartan  4. Chronic Diastolic Heart Failure Volume status is much improved   Looks good   Discussed with D [name]   Will resume home dose of torsemide    REsume tomorrow since got IV today   5.  REnal  BMET stat  Does not need to wait on this    Send home on torsemide 80 am and 40 pm   Start tomorrow.    6 , OHS,OSA  Does not tolerate  CPAP  6. Morbid Obesity.  Body mass index is 49.01 kg/m.  8  HL  ON praluant at home  COntinue Zetia and statin  Cardiac Rehab following.   Length of Stay: 2  Dorris Carnes, MD  10/23/2017, 7:02 AM

## 2017-10-23 NOTE — Progress Notes (Signed)
Patient provided with 30 day Eliquis card. No there CM needs identified.

## 2017-10-27 ENCOUNTER — Telehealth (HOSPITAL_COMMUNITY): Payer: Self-pay

## 2017-10-27 NOTE — Telephone Encounter (Signed)
Attempted to make initial call to patient in regards to Cardiac Rehab - lm on vm. Patients paperwork in the follow up bin for 10/3.

## 2017-10-27 NOTE — Telephone Encounter (Signed)
Patients insurance is active and benefits verified through Medicare A/B - No co-pay, deductible amount of $185.00/$185.00 has been met, no out of pocket, 20% co-insurance, and no pre-authorization is required. Passport/reference [id number]  Patients insurance is active through [place] [phone]  Will contact patient in regards to Cardiac Rehab. If interested, patient will need to complete follow up appt on 11/03/17. Once completed, patient will be contacted for scheduling.

## 2017-11-03 ENCOUNTER — Encounter (HOSPITAL_COMMUNITY): Payer: Self-pay

## 2017-11-03 ENCOUNTER — Ambulatory Visit (HOSPITAL_COMMUNITY)
Admission: RE | Admit: 2017-11-03 | Discharge: 2017-11-03 | Disposition: A | Discharge status: Home / Self Care | Payer: Medicare Other | Source: Ambulatory Visit | Attending: Cardiology | Admitting: Cardiology

## 2017-11-03 VITALS — BP 160/82 | HR 60 | Wt 292.0 lb

## 2017-11-03 DIAGNOSIS — F419 Anxiety disorder, unspecified: Secondary | ICD-10-CM | POA: Diagnosis not present

## 2017-11-03 DIAGNOSIS — I25119 Atherosclerotic heart disease of native coronary artery with unspecified angina pectoris: Secondary | ICD-10-CM | POA: Diagnosis not present

## 2017-11-03 DIAGNOSIS — Z951 Presence of aortocoronary bypass graft: Secondary | ICD-10-CM | POA: Insufficient documentation

## 2017-11-03 DIAGNOSIS — Z6841 Body Mass Index (BMI) 40.0 and over, adult: Secondary | ICD-10-CM | POA: Insufficient documentation

## 2017-11-03 DIAGNOSIS — Z955 Presence of coronary angioplasty implant and graft: Secondary | ICD-10-CM | POA: Diagnosis not present

## 2017-11-03 DIAGNOSIS — I350 Nonrheumatic aortic (valve) stenosis: Secondary | ICD-10-CM | POA: Diagnosis not present

## 2017-11-03 DIAGNOSIS — Z79899 Other long term (current) drug therapy: Secondary | ICD-10-CM | POA: Insufficient documentation

## 2017-11-03 DIAGNOSIS — E785 Hyperlipidemia, unspecified: Secondary | ICD-10-CM | POA: Diagnosis not present

## 2017-11-03 DIAGNOSIS — K449 Diaphragmatic hernia without obstruction or gangrene: Secondary | ICD-10-CM | POA: Insufficient documentation

## 2017-11-03 DIAGNOSIS — I130 Hypertensive heart and chronic kidney disease with heart failure and stage 1 through stage 4 chronic kidney disease, or unspecified chronic kidney disease: Secondary | ICD-10-CM | POA: Diagnosis present

## 2017-11-03 DIAGNOSIS — N189 Chronic kidney disease, unspecified: Secondary | ICD-10-CM | POA: Insufficient documentation

## 2017-11-03 DIAGNOSIS — Z794 Long term (current) use of insulin: Secondary | ICD-10-CM | POA: Insufficient documentation

## 2017-11-03 DIAGNOSIS — K219 Gastro-esophageal reflux disease without esophagitis: Secondary | ICD-10-CM | POA: Insufficient documentation

## 2017-11-03 DIAGNOSIS — I5032 Chronic diastolic (congestive) heart failure: Secondary | ICD-10-CM | POA: Insufficient documentation

## 2017-11-03 DIAGNOSIS — Z7982 Long term (current) use of aspirin: Secondary | ICD-10-CM | POA: Diagnosis not present

## 2017-11-03 DIAGNOSIS — G4733 Obstructive sleep apnea (adult) (pediatric): Secondary | ICD-10-CM | POA: Insufficient documentation

## 2017-11-03 DIAGNOSIS — I10 Essential (primary) hypertension: Secondary | ICD-10-CM

## 2017-11-03 DIAGNOSIS — Z8673 Personal history of transient ischemic attack (TIA), and cerebral infarction without residual deficits: Secondary | ICD-10-CM | POA: Insufficient documentation

## 2017-11-03 DIAGNOSIS — Z7902 Long term (current) use of antithrombotics/antiplatelets: Secondary | ICD-10-CM | POA: Insufficient documentation

## 2017-11-03 DIAGNOSIS — Z7901 Long term (current) use of anticoagulants: Secondary | ICD-10-CM | POA: Diagnosis not present

## 2017-11-03 DIAGNOSIS — F329 Major depressive disorder, single episode, unspecified: Secondary | ICD-10-CM | POA: Insufficient documentation

## 2017-11-03 DIAGNOSIS — E1143 Type 2 diabetes mellitus with diabetic autonomic (poly)neuropathy: Secondary | ICD-10-CM | POA: Insufficient documentation

## 2017-11-03 DIAGNOSIS — I480 Paroxysmal atrial fibrillation: Secondary | ICD-10-CM | POA: Insufficient documentation

## 2017-11-03 DIAGNOSIS — I2510 Atherosclerotic heart disease of native coronary artery without angina pectoris: Secondary | ICD-10-CM | POA: Insufficient documentation

## 2017-11-03 DIAGNOSIS — I5022 Chronic systolic (congestive) heart failure: Secondary | ICD-10-CM

## 2017-11-03 DIAGNOSIS — E1122 Type 2 diabetes mellitus with diabetic chronic kidney disease: Secondary | ICD-10-CM | POA: Insufficient documentation

## 2017-11-03 LAB — BASIC METABOLIC PANEL
Anion gap: 9 (ref 5–15)
BUN: 18 mg/dL (ref 8–23)
CO2: 26 mmol/L (ref 22–32)
Calcium: 9.5 mg/dL (ref 8.9–10.3)
Chloride: 105 mmol/L (ref 98–111)
Creatinine, Ser: 1.03 mg/dL — ABNORMAL HIGH (ref 0.44–1.00)
GFR calc Af Amer: >60 mL/min (ref >60)
GFR calc non Af Amer: 54 mL/min — ABNORMAL LOW (ref >60)
Glucose, Bld: 159 mg/dL — ABNORMAL HIGH (ref 70–99)
Potassium: 4.3 mmol/L (ref 3.5–5.1)
Sodium: 140 mmol/L (ref 135–145)

## 2017-11-03 LAB — CBC
HCT: 45.6 % (ref 36.0–46.0)
Hemoglobin: 14.5 g/dL (ref 12.0–15.0)
MCH: 31.4 pg (ref 26.0–34.0)
MCHC: 31.8 g/dL (ref 30.0–36.0)
MCV: 98.7 fL (ref 78.0–100.0)
Platelets: 207 10*3/uL (ref 150–400)
RBC: 4.62 MIL/uL (ref 3.87–5.11)
RDW: 12.6 % (ref 11.5–15.5)
WBC: 10.9 10*3/uL — ABNORMAL HIGH (ref 4.0–10.5)

## 2017-11-03 MED ORDER — CLONIDINE HCL 0.2 MG PO TABS: 0.2000 mg | ORAL_TABLET | Freq: Two times a day (BID) | ORAL | 11 refills | Status: DC

## 2017-11-03 MED ORDER — LOSARTAN POTASSIUM 50 MG PO TABS: 50.0000 mg | ORAL_TABLET | Freq: Every day | ORAL | 6 refills | Status: DC

## 2017-11-03 NOTE — Patient Instructions (Signed)
Be sure to CHANGE Amiodarone to 200 mg, one tab daily START Clonidine 0.2 mg, one tab twice a day INCREASE Losartan 50 mg, one tab daily   Labs today We will only contact you if something comes back abnormal or we need to make some changes. Otherwise no news is good news!  Your physician recommends that you schedule a follow-up appointment in: 2 weeks  in the [facility] ([facility]NP[facility]    Do the following things EVERYDAY: 1) Weigh yourself in the morning before breakfast. Write it down and keep it in a log. 2) Take your medicines as prescribed 3) Eat low salt foods-Limit salt (sodium) to 2000 mg per day.  4) Stay as active as you can everyday 5) Limit all fluids for the day to less than 2 liters

## 2017-11-03 NOTE — Progress Notes (Signed)
Advanced Heart Failure Clinic Note  Patient ID: [name] [name], female   DOB: [birth year], 72 y.o.   [name]: [id number] PCP: Dr. Sandi Mariscal Cardiology: Dr. [name]  72 y.o. with history of CAD s/p CABG, diastolic CHF, and cerebrovascular disease with history of CVA presents for followup of CHF and diastolic CHF. She had CABG x 5 in 12/11.  Prior to the CABG she had multiple PCIs.  She had a CVA in 2/14 that presented as visual loss.  She had a left carotid stent in 2/15.   Left heart cath done in Sept. 2014. This showed patent SVG-D, LIMA-LAD, and sequential SVG-OM branches but SVG-RCA and the native RCA were both totally occluded. It was felt aht her increased symptoms coincided with occlusion of SVG-RCA.  She was started on Imdur to see if this would help with the chest pain and dyspnea.  However, she feels like Imdur causes leg cramps and does not think that she can take it.  So ranolazine 500 mg bid started and titrated this up to 1000 mg bid.  This helped some but not markedly.  Therefore, sent to  Dr. Irish Lack to address opening RCA CTO. He was able to do this in 5/15 with 4 overlapping Xience DES.  This led to resolution of exertional chest pain.    [name] was started on Brilinta after PCI.  She became much more short of breath after starting on Brilinta. Per Dr Delano Metz stopped and replaced with Plavix.  Dyspnea improved off Brilinta. Given increasing exertional dyspnea and chest pain,  RHC/LHC was done in 4/17.  This showed stable CAD with no interventional target.  Left and right heart filling pressures were not significantly increased.  Medical management.   She had an MRI/MRA in May 2018 that showed moderately severe stenosis of the supraclinoid segment of the right internal carotid artery, progressed since the 2016 MR angiogram and severe basilar artery stenosis.   She developed recurrent exertional chest pain and had LHC in 6/18, showing 4/5 grafts patent with patent native RCA  (similar to prior cath, no changes).    Echo 1/19 with EF 55-60%, moderate diastolic dysfunction, PASP 50 mmHg, mildly dilated RV, mild AS, mild-moderate MR.   Admitted for [facility] on 10/20/17 with DES to sequential SVG-OM1 and PLOM. Hospital course complicated by A fib RVR. Started on amiodarone and chemically converted. Started on eliquis.   Today she returns for post hospital follow up. Overall feeling fine. Having some nausea. Denies chest pain. SOB with exertion but feeling a little. Denies PND/Orthopnea. Appetite ok. No fever or chills. Weight at home has been stable. No bleeding problems. Taking all medications.   Labs (6/14): K 3.8, creatinine 0.71 Labs (8/14): BNP 249, LDL 166 Labs (9/14): K 4.7, creatinine 0.7 Labs (9/14): K 4.6, creatinine 0.9, BNP 109 Labs (1/15): K 4.5, creatinine 0.73, LDL 212, HCT 43.4, TSH normal, BNP 138 Labs (6/15): K 4.7, creatinine 1.17, LDL 100, HDL 37, TGs 363, BNP 39 Labs (10/15): K 4.6, creatinine 1.2 Labs (1/16): LDL 157, HDL 43, K 5.2, creatinine 0.95 Labs (2/16): K 4.5, creatinine 1.08, BNP 113 Labs (4/16): LDL 50, HDL 56, TGs 179 Labs (9/16): K 5.3, creatinine 1.09 Labs (10/16): LDL 75, HDL 56 Labs (2/17): K 5.2, creatinine 1.07 Labs (4/17): K 5.1, creatinine 1.07 Labs (5/17): K 4.5, creatinine 1.01, LDL 96, HDL 56, BNP 70 Labs (03/2016) K 4.9, creatinine 1.07 Labs (7/18): K 4.9, creatinine 1.15 Labs (11/18): K 5.1, creatinine 1.11, LDL 36  Labs (1/19): K 4.9, creatinine 1.22 Labs (4/19): K 4.7, creatinine 1.2 Labs (09/15/2017): K 4.6 Creatinine 1.29  Labs 11/03/2017: K 4.3 Creatinine 1.03   PMH: 1. Diabetic gastroparesis 2. Type II diabetes 3. HTN 4. Morbid obesity 5. CAD: s/p CABG in 12/11 after prior PCIs.  LIMA-LAD, SVG-D, seq SVG-OM1 and OM2, SVG-PDA. Adenosine Cardiolite (8/14) with EF 53% and a small reversible apical defect with a medium, partially reversible inferior defect.  LHC (9/14) with patent SVG-D, LIMA-LAD (50% distal LAD), and  sequential SVG-OM branches; the SVG-RCA and the native RCA were occluded.  This was managed medically initially, but with ongoing exertional chest pain, it was decided to open CTO.  Patient had CTO opening with 4 overlapping Xience DES in the RCA in 5/15.   - LHC (4/17): SVG-D patent, LIMA-LAD patent, distal LAD with several 40-50% stenoses, sequential SVG-OM1 and PLOM patent with 50% proximal stenosis (not flow limiting), SVG-RCA TO with patent RCA stents.  - LHC (6/18): 4/5 grafts patent (SVG-RCA TO, same as past), RCA stents patent => no change.  - Myoview 10/10/2017 some concern for ischemia. -[place]  9/19 S/P LHC with stent to vein graft distal circumflex 6. Atypical migraines 7. OHS/OSA: Intolerant of CPAP. Uses oxygen with exertion and at night.  8. GERD with hiatal hernia 9. OA 10. Diastolic CHF: Echo (23/76) with EF 50-55%, grade II diastolic dysfunction, mild-moderate MR.  Echo (8/14) with EF 55-60%, grade II diastolic dysfunction, mildly increased aortic valve gradient (mean 12 mmHg) but valve opens well, mild MR and mild RV dilation.  Echo (9/15) with EF 60-65%, grade II diastolic dysfunction, mild aortic stenosis, mild mitral stenosis, mild to moderate mitral regurgitation, mildly dilated RV with normal systolic function, PA systolic pressure 46 mmHg.  - RHC (4/17): mean RA 9, PA 38/15 mean 27, mean PCWP 9, CI 2.5.  - Echo (5/17): EF 55-60%, mild LVH, mildly dilated RV with low normal systolic function, moderate TR, PASP 61 mmHg - Echo (1/19): EF 55-60%, moderate diastolic dysfunction, PASP 50 mmHg, mildly dilated RV, mild AS, mild-moderate MR. 11. CKD 12. Chronic LBBB 13. Anxiety 14. Carotid stenosis: Followed by [facility], >28% LICA stenosis 31/51.  She had left carotid stent in 2/15. Carotid dopplers 8/15 with no significant disease. Carotid dopplers (4/16): < 40% RICA stenosis.  15. Cerebrovascular disease: CVA 2/14 with right posterior cerebral artery territory ischemic infarction. Cerebral  angiogram in 6/14 showed 70% right vertebral ostial stenosis, 76-16% LICA stenosis, > 07% proximal left posterior cerebral artery stenosis, posterior communicating artery aneurysm.  Patient has had episodes of transient expressive aphasia.  Carotid dopplers (37/10) showed >62% LICA stenosis. She had a left carotid stent 2/15.  Possible CVA in 7/15. -  MRI/MRA in May 2018 that showed moderately severe stenosis of the supraclinoid segment of the right internal carotid artery, progressed since the 2016 MR angiogram and severe basilar artery stenosis.  16. Positional vertigo (suspected) 17. Palpitations: Holter (6/15) with rare PVCs/PACs.  - Event monitor (2/18): NSR, occasional PVCs 18. Dyspnea with Brilinta. 19. Melanoma: On face, s/p excision.   20. Aortic stenosis: Mild.  21. Holter (8/16) with no significant arrhythmias.  22. Lower extremity arterial dopplers (9/16) were normal.  23. Sleep study (4/18): No significant OSA.  69. A fib   SH: Married, nonsmoker  FH: CAD  Review of systems complete and found to be negative unless listed in HPI.   Current Outpatient Medications  Medication Sig Dispense Refill  . acetaminophen (TYLENOL) 500 MG tablet  Take 1,000 mg by mouth every 6 (six) hours as needed for mild pain or headache.     . albuterol (PROVENTIL) (2.5 MG/3ML) 0.083% nebulizer solution Take 2.5 mg by nebulization every 6 (six) hours as needed for wheezing or shortness of breath.     Marland Kitchen albuterol (PROVENTIL,VENTOLIN) 90 MCG/ACT inhaler Inhale 2 puffs into the lungs every 4 (four) hours as needed for wheezing or shortness of breath.     . Alirocumab (PRALUENT) 150 MG/ML SOPN Inject 1 pen into the skin every 14 (fourteen) days. 6 pen 3  . ALPRAZolam (XANAX) 0.5 MG tablet Take 0.5-1 mg by mouth See admin instructions. Take 0.5 mg by mouth in the morning and take 0.5 mg by mouth at noon. Take 1 mg by mouth at bedtime.    Marland Kitchen amiodarone (PACERONE) 200 MG tablet Take 200 mg by mouth daily.    Marland Kitchen  amLODipine (NORVASC) 10 MG tablet Take 10 mg by mouth at bedtime.     Marland Kitchen apixaban (ELIQUIS) 5 MG TABS tablet Take 1 tablet (5 mg total) by mouth 2 (two) times daily. 60 tablet 2  . aspirin EC 81 MG tablet Take 1 tablet (81 mg total) by mouth daily.    Marland Kitchen b complex vitamins capsule Take 1 capsule by mouth daily.    . Calcium Carbonate 1500 (600 CA) MG TABS Take 1,500 mg by mouth daily with breakfast.    . Cholecalciferol (VITAMIN D) 2000 UNITS tablet Take 2,000 Units by mouth daily with breakfast.     . clopidogrel (PLAVIX) 75 MG tablet TAKE ONE (1) TABLET EACH DAY (Patient taking differently: Take 75 mg by mouth at bedtime. ) 90 tablet 2  . denosumab (PROLIA) 60 MG/ML SOLN injection Inject 60 mg into the skin every 6 (six) months. Administer in upper arm, thigh, or abdomen    . DULoxetine (CYMBALTA) 30 MG capsule Take 1 capsule (30 mg total) by mouth 2 (two) times daily. (Patient taking differently: Take 60 mg by mouth at bedtime. ) 180 capsule 3  . ezetimibe (ZETIA) 10 MG tablet TAKE ONE (1) TABLET EACH DAY (Patient taking differently: Take 10 mg by mouth at bedtime. ) 90 tablet 3  . Fluticasone-Salmeterol (ADVAIR DISKUS) 250-50 MCG/DOSE AEPB Inhale 1 puff into the lungs 2 (two) times daily.     Marland Kitchen gabapentin (NEURONTIN) 600 MG tablet Take 600-1,200 mg by mouth See admin instructions. Take 600 mg by mouth in the morning and take 600 mg by mouth in the afternoon. Take 1200 mg by mouth at bedtime.    Marland Kitchen HUMALOG KWIKPEN 100 UNIT/ML SOPN Inject 22-26 Units into the skin 3 (three) times daily before meals. Per sliding scale    . HYDROcodone-acetaminophen (NORCO/VICODIN) 5-325 MG per tablet Take 1 tablet by mouth at bedtime.     Marland Kitchen ipratropium-albuterol (DUONEB) 0.5-2.5 (3) MG/3ML SOLN Take 3 mLs by nebulization every 4 (four) hours as needed (for severe shortness of breath or wheezing).     Marland Kitchen LANTUS SOLOSTAR 100 UNIT/ML Solostar Pen Inject 54 Units into the skin every morning.     . loperamide (IMODIUM) 2 MG  capsule Take 2 mg by mouth as needed for diarrhea or loose stools.    Marland Kitchen losartan (COZAAR) 25 MG tablet Take 1 tablet (25 mg total) by mouth daily with breakfast. 30 tablet 0  . meclizine (ANTIVERT) 12.5 MG tablet Take 12.5 mg by mouth 3 (three) times daily as needed for dizziness.    . methocarbamol (ROBAXIN) 500 MG tablet  TAKE ONE (1) TABLET THREE (3) TIMES EACH DAY (Patient taking differently: Take 500 mg by mouth 2 (two) times daily. ) 60 tablet 4  . metolazone (ZAROXOLYN) 2.5 MG tablet Take 1 tablet (2.5 mg total) by mouth once a week. Take 1 Tablet once weekly on Mondays. (Patient taking differently: Take 2.5 mg by mouth every Monday. ) 15 tablet 3  . metoprolol succinate (TOPROL-XL) 25 MG 24 hr tablet TAKE ONE TABLET TWICE DAILY (Patient taking differently: Take 25 mg by mouth 2 (two) times daily. ) 180 tablet 3  . Multiple Vitamin (MULTIVITAMIN) capsule Take 1 capsule by mouth daily.    . multivitamin-lutein (OCUVITE-LUTEIN) CAPS capsule Take 1 capsule by mouth daily.    . nitroGLYCERIN (NITROSTAT) 0.3 MG SL tablet Place 1 tablet (0.3 mg total) under the tongue every 5 (five) minutes x 3 doses as needed for chest pain. 9 tablet 1  . ondansetron (ZOFRAN) 4 MG tablet Take 1 tablet (4 mg total) by mouth 2 (two) times daily as needed. For nausea (Patient taking differently: Take 4 mg by mouth 2 (two) times daily. For nausea) 20 tablet 0  . OXYGEN Inhale 2 L into the lungs at bedtime.     . pantoprazole (PROTONIX) 40 MG tablet Take 1 tablet (40 mg total) by mouth 2 (two) times daily. 180 tablet 0  . RANEXA 1000 MG SR tablet TAKE ONE (1) TABLET BY MOUTH TWO (2) TIMES DAILY (Patient taking differently: Take 1,000 mg by mouth 2 (two) times daily. ) 180 each 2  . rosuvastatin (CRESTOR) 40 MG tablet TAKE ONE (1) TABLET EACH DAY (Patient taking differently: Take 40 mg by mouth at bedtime. ) 90 tablet 6  . torsemide (DEMADEX) 20 MG tablet Take 4 tablets (80 mg total) by mouth every morning AND 2 tablets (40  mg total) every evening. 240 tablet 3  . triamcinolone cream (KENALOG) 0.1 % Apply 1 application topically 2 (two) times daily.     . vitamin B-12 (CYANOCOBALAMIN) 1000 MCG tablet Take 1,000 mcg by mouth 3 (three) times a week.     . zinc gluconate 50 MG tablet Take 50 mg by mouth 3 (three) times a week.     . zolpidem (AMBIEN) 10 MG tablet Take 10 mg by mouth at bedtime.     . potassium chloride SA (K-DUR,KLOR-CON) 20 MEQ tablet Take 1 tablet (20 mEq total) by mouth 2 (two) times daily. 60 tablet 1   No current facility-administered medications for this encounter.    Vitals:   11/03/17 1139  BP: (!) 160/82  Pulse: 60  SpO2: 98%    BP (!) 160/82 (BP Location: Left Arm, Patient Position: Sitting, Cuff Size: Large)   Pulse 60   Wt 132.5 kg (292 lb)   SpO2 98%   BMI 50.12 kg/m    Wt Readings from Last 3 Encounters:  11/03/17 132.5 kg (292 lb)  10/23/17 129.5 kg (285 lb 8 oz)  10/13/17 134.7 kg (297 lb)    PHYSICAL EXAM General:  Well appearing. No resp difficulty HEENT: normal Neck: supple. JVD -6-7. Carotids 2+ bilat; no bruits. No lymphadenopathy or thryomegaly appreciated. Cor: PMI nondisplaced. Regular rate & rhythm. No rubs, gallops or murmurs. Lungs: clear Abdomen: obese, soft, nontender, nondistended. No hepatosplenomegaly. No bruits or masses. Good bowel sounds. Extremities: no cyanosis, clubbing, rash, edema Neuro: alert & orientedx3, cranial nerves grossly intact. moves all 4 extremities w/o difficulty. Affect pleasant  EKG: NSR 64 bpm with PVCs.   Assessment/Plan:  1. CAD: Occluded native RCA and SVG-RCA.  Now status post opening of CTO RCA with 4 overlapping Xience DES in 5/15.  Most recent cath in 6/18 showed patent RCA stents, no interventional target. -  9/19 S/P LHC with stent to vein graft distal circumflex - Continue ASA, statin, losartan, Crestor, amlodipine, Toprol XL 25 mg BID, and ranolazine.  - She will continue Plavix long-term.   - She has not  tolerated Imdur in the past. (muscle pains, inability to walk) - No chest pain. Plans to start cardiac rehab in a few weeks.  2. Chronic diastolic CHF:  Echo in 3/56 with EF 55-60%, moderate diastolic dysfunction.  NYHA IIIb. Volume status stable.  - Continue torsemide 80 mg am, 40 mg pm.   - Check BMT  3. Hyperlipidemia: She remains on Zetia, Crestor and Praluent.  Good lipids in 11/18. Check lipids next visit.   4. Hypertension:  Elevated. Increase losartan to 50 mg daily and increase clonidine to 0.2 mg twice a day.  5. Cerebrovascular disease: She has history of CVA and had left carotid stent in 2/15. - Followed by [facility].  6. Palpitations: resolved.  7. OHS/OSA: Cannot tolerate CPAP.  Uses oxygen with exertion and at night. Encouraged her to follow up with [facility] for home oxygen oximyzer - Follows with Dr Elsworth Soho.  8. Depression - Encouraged her to follow up with her PCP for further management 9. Morbid Obesity Body mass index is 50.12 kg/m. Discussed portion control.  10. PAF In NSR today. Cut back amio to 200 mg daily.  - Continue eliquis 5 mg twice a day. Check CBC today.   Follow up wit Dr [name] in a few weeks.    Darrick Grinder, NP 11/03/2017

## 2017-11-07 ENCOUNTER — Telehealth (HOSPITAL_COMMUNITY): Payer: Self-pay

## 2017-11-07 NOTE — Telephone Encounter (Signed)
Attempted to contact patient in regards to Cardiac Rehab - lm on vm

## 2017-11-09 ENCOUNTER — Ambulatory Visit: Payer: Medicare Other | Admitting: Podiatry

## 2017-11-09 ENCOUNTER — Other Ambulatory Visit: Payer: Self-pay | Admitting: Cardiology

## 2017-11-22 ENCOUNTER — Telehealth (HOSPITAL_COMMUNITY): Payer: Self-pay | Admitting: *Deleted

## 2017-11-22 ENCOUNTER — Ambulatory Visit (HOSPITAL_COMMUNITY)
Admission: RE | Admit: 2017-11-22 | Discharge: 2017-11-22 | Disposition: A | Discharge status: Home / Self Care | Payer: Medicare Other | Source: Ambulatory Visit | Attending: Cardiology | Admitting: Cardiology

## 2017-11-22 VITALS — BP 160/80 | HR 75 | Wt 292.6 lb

## 2017-11-22 DIAGNOSIS — I25119 Atherosclerotic heart disease of native coronary artery with unspecified angina pectoris: Secondary | ICD-10-CM

## 2017-11-22 DIAGNOSIS — I480 Paroxysmal atrial fibrillation: Secondary | ICD-10-CM | POA: Diagnosis not present

## 2017-11-22 DIAGNOSIS — Z794 Long term (current) use of insulin: Secondary | ICD-10-CM | POA: Diagnosis not present

## 2017-11-22 DIAGNOSIS — E785 Hyperlipidemia, unspecified: Secondary | ICD-10-CM | POA: Insufficient documentation

## 2017-11-22 DIAGNOSIS — F419 Anxiety disorder, unspecified: Secondary | ICD-10-CM | POA: Insufficient documentation

## 2017-11-22 DIAGNOSIS — Z951 Presence of aortocoronary bypass graft: Secondary | ICD-10-CM | POA: Diagnosis not present

## 2017-11-22 DIAGNOSIS — K449 Diaphragmatic hernia without obstruction or gangrene: Secondary | ICD-10-CM | POA: Insufficient documentation

## 2017-11-22 DIAGNOSIS — I130 Hypertensive heart and chronic kidney disease with heart failure and stage 1 through stage 4 chronic kidney disease, or unspecified chronic kidney disease: Secondary | ICD-10-CM | POA: Diagnosis not present

## 2017-11-22 DIAGNOSIS — K219 Gastro-esophageal reflux disease without esophagitis: Secondary | ICD-10-CM | POA: Diagnosis not present

## 2017-11-22 DIAGNOSIS — Z7982 Long term (current) use of aspirin: Secondary | ICD-10-CM | POA: Diagnosis not present

## 2017-11-22 DIAGNOSIS — E1122 Type 2 diabetes mellitus with diabetic chronic kidney disease: Secondary | ICD-10-CM | POA: Insufficient documentation

## 2017-11-22 DIAGNOSIS — R002 Palpitations: Secondary | ICD-10-CM | POA: Insufficient documentation

## 2017-11-22 DIAGNOSIS — Z7902 Long term (current) use of antithrombotics/antiplatelets: Secondary | ICD-10-CM | POA: Insufficient documentation

## 2017-11-22 DIAGNOSIS — I5032 Chronic diastolic (congestive) heart failure: Secondary | ICD-10-CM | POA: Insufficient documentation

## 2017-11-22 DIAGNOSIS — Z6841 Body Mass Index (BMI) 40.0 and over, adult: Secondary | ICD-10-CM | POA: Insufficient documentation

## 2017-11-22 DIAGNOSIS — Z7901 Long term (current) use of anticoagulants: Secondary | ICD-10-CM | POA: Diagnosis not present

## 2017-11-22 DIAGNOSIS — N189 Chronic kidney disease, unspecified: Secondary | ICD-10-CM | POA: Diagnosis not present

## 2017-11-22 DIAGNOSIS — G4733 Obstructive sleep apnea (adult) (pediatric): Secondary | ICD-10-CM | POA: Insufficient documentation

## 2017-11-22 DIAGNOSIS — Z79899 Other long term (current) drug therapy: Secondary | ICD-10-CM | POA: Insufficient documentation

## 2017-11-22 DIAGNOSIS — I2510 Atherosclerotic heart disease of native coronary artery without angina pectoris: Secondary | ICD-10-CM | POA: Insufficient documentation

## 2017-11-22 LAB — COMPREHENSIVE METABOLIC PANEL
ALT: 23 U/L (ref 0–44)
AST: 30 U/L (ref 15–41)
Albumin: 3.6 g/dL (ref 3.5–5.0)
Alkaline Phosphatase: 61 U/L (ref 38–126)
Anion gap: 11 (ref 5–15)
BUN: 28 mg/dL — ABNORMAL HIGH (ref 8–23)
CO2: 28 mmol/L (ref 22–32)
Calcium: 9.2 mg/dL (ref 8.9–10.3)
Chloride: 102 mmol/L (ref 98–111)
Creatinine, Ser: 1.62 mg/dL — ABNORMAL HIGH (ref 0.44–1.00)
GFR calc Af Amer: 36 mL/min — ABNORMAL LOW (ref >60)
GFR calc non Af Amer: 31 mL/min — ABNORMAL LOW (ref >60)
Glucose, Bld: 153 mg/dL — ABNORMAL HIGH (ref 70–99)
Potassium: 4.5 mmol/L (ref 3.5–5.1)
Sodium: 141 mmol/L (ref 135–145)
Total Bilirubin: 1.9 mg/dL — ABNORMAL HIGH (ref 0.3–1.2)
Total Protein: 6.8 g/dL (ref 6.5–8.1)

## 2017-11-22 LAB — TSH: TSH: 2.808 u[IU]/mL (ref 0.350–4.500)

## 2017-11-22 MED ORDER — METOLAZONE 2.5 MG PO TABS: 2.5000 mg | ORAL_TABLET | ORAL | 3 refills | Status: DC

## 2017-11-22 MED ORDER — METOPROLOL SUCCINATE ER 25 MG PO TB24: 25.0000 mg | ORAL_TABLET | Freq: Two times a day (BID) | ORAL | 3 refills | Status: DC

## 2017-11-22 MED ORDER — LOSARTAN POTASSIUM 50 MG PO TABS: 50.0000 mg | ORAL_TABLET | Freq: Two times a day (BID) | ORAL | 6 refills | Status: DC

## 2017-11-22 MED ORDER — LOSARTAN POTASSIUM 50 MG PO TABS: 50.0000 mg | ORAL_TABLET | Freq: Every day | ORAL | 6 refills | Status: DC

## 2017-11-22 MED ORDER — TORSEMIDE 20 MG PO TABS: 80.0000 mg | ORAL_TABLET | Freq: Two times a day (BID) | ORAL | 3 refills | Status: DC

## 2017-11-22 NOTE — Telephone Encounter (Signed)
Notes recorded by Scarlette Calico, RN on 11/22/2017 at 4:31 PM EDT Pt aware, agreeable and verbalizes understanding, she is already sch for f/u labs on 10/31 and will keep that appt.

## 2017-11-22 NOTE — Progress Notes (Signed)
Patient ID: [name] [name], female   DOB: Feb 06, 1945, 72 y.o.   [name]: [id number] PCP: Dr. Sandi [name] Cardiology: Dr. Aundra [name]  72 y.o. with history of CAD s/p CABG, diastolic CHF, and cerebrovascular disease with history of CVA presents for followup of CHF and diastolic CHF. She had CABG x 5 in 12/11.  Prior to the CABG she had multiple PCIs.  She had a CVA in 2/14 that presented as visual loss.  She had a left carotid stent in 2/15.   Left heart cath done in Sept. 2014. This showed patent SVG-D, LIMA-LAD, and sequential SVG-OM branches but SVG-RCA and the native RCA were both totally occluded. It was felt aht her increased symptoms coincided with occlusion of SVG-RCA.  She was started on Imdur to see if this would help with the chest pain and dyspnea.  However, she feels like Imdur causes leg cramps and does not think that she can take it.  So ranolazine 500 mg bid started and titrated this up to 1000 mg bid.  This helped some but not markedly.  Therefore, sent to  Dr. Irish [name] to address opening RCA CTO. He was able to do this in 5/15 with 4 overlapping Xience DES.  This led to resolution of exertional chest pain.    [name] [name] was started on Brilinta after PCI.  She became much more short of breath after starting on Brilinta. Per Dr [name] [name] stopped and replaced with Plavix.  Dyspnea improved off Brilinta. Given increasing exertional dyspnea and chest pain,  RHC/LHC was done in 4/17.  This showed stable CAD with no interventional target.  Left and right heart filling pressures were not significantly increased.  Medical management.   She had an MRI/MRA in May 2018 that showed moderately severe stenosis of the supraclinoid segment of the right internal carotid artery, progressed since the 2016 MR angiogram and severe basilar artery stenosis.   She developed recurrent exertional chest pain and had LHC in 6/18, showing 4/5 grafts patent with patent native RCA (similar to prior cath, no changes).     Echo 1/19 with EF 55-60%, moderate diastolic dysfunction, PASP 50 mmHg, mildly dilated RV, mild AS, mild-moderate MR.   She reported increased dyspnea and chest pain and had RHC/LHC in 9/19.  She had DES to sequential SVG-OM1/PLOM.  While hospitalized, she was noted to have runs of atrial fibrillation.  She was very symptomatic with the atrial fibrillation.  Eliquis and amiodarone were started.   She returns for followup of CHF and CAD today.  Weight is stable.  Rare atypical chest pain (improved since PCI).  No runs of palpitations on amiodarone, she is in NSR today.  She has generalized fatigue.  Still short of breath walking short distances.  She still has orthopnea.  BP is high today in the office but SBP generally 120s-130s when she checks at home.  She will start cardiac rehab in 12/19.  Labs (6/14): K 3.8, creatinine 0.71 Labs (8/14): BNP 249, LDL 166 Labs (9/14): K 4.7, creatinine 0.7 Labs (9/14): K 4.6, creatinine 0.9, BNP 109 Labs (1/15): K 4.5, creatinine 0.73, LDL 212, HCT 43.4, TSH normal, BNP 138 Labs (6/15): K 4.7, creatinine 1.17, LDL 100, HDL 37, TGs 363, BNP 39 Labs (10/15): K 4.6, creatinine 1.2 Labs (1/16): LDL 157, HDL 43, K 5.2, creatinine 0.95 Labs (2/16): K 4.5, creatinine 1.08, BNP 113 Labs (4/16): LDL 50, HDL 56, TGs 179 Labs (9/16): K 5.3, creatinine 1.09 Labs (10/16): LDL 75, HDL  56 Labs (2/17): K 5.2, creatinine 1.07 Labs (4/17): K 5.1, creatinine 1.07 Labs (5/17): K 4.5, creatinine 1.01, LDL 96, HDL 56, BNP 70 Labs (03/2016) K 4.9, creatinine 1.07 Labs (7/18): K 4.9, creatinine 1.15 Labs (11/18): K 5.1, creatinine 1.11, LDL 36 Labs (1/19): K 4.9, creatinine 1.22 Labs (9/19): LDL 108 Labs (10/19): hgb 14.5, K 4.3, creatinine 1.03  ECG (personally reviewed): NSR, LBBB  PMH: 1. Diabetic gastroparesis 2. Type II diabetes 3. HTN 4. Morbid obesity 5. CAD: s/p CABG in 12/11 after prior PCIs.  LIMA-LAD, SVG-D, seq SVG-OM1 and OM2, SVG-PDA. Adenosine  Cardiolite (8/14) with EF 53% and a small reversible apical defect with a medium, partially reversible inferior defect.  LHC (9/14) with patent SVG-D, LIMA-LAD (50% distal LAD), and sequential SVG-OM branches; the SVG-RCA and the native RCA were occluded.  This was managed medically initially, but with ongoing exertional chest pain, it was decided to open CTO.  Patient had CTO opening with 4 overlapping Xience DES in the RCA in 5/15.   - LHC (4/17): SVG-D patent, LIMA-LAD patent, distal LAD with several 40-50% stenoses, sequential SVG-OM1 and PLOM patent with 50% proximal stenosis (not flow limiting), SVG-RCA TO with patent RCA stents.  - LHC (6/18): 4/5 grafts patent (SVG-RCA TO, same as past), RCA stents patent => no change.  - LHC (9/19): Sequential SVG-PLOM/OM1 with 80% proximal stenosis, s/p DES.  6. Atypical migraines 7. OHS/OSA: Intolerant of CPAP. Uses oxygen with exertion and at night.  8. GERD with hiatal hernia 9. OA 10. Diastolic CHF: Echo (67/67) with EF 50-55%, grade II diastolic dysfunction, mild-moderate MR.  Echo (8/14) with EF 55-60%, grade II diastolic dysfunction, mildly increased aortic valve gradient (mean 12 mmHg) but valve opens well, mild MR and mild RV dilation.  Echo (9/15) with EF 60-65%, grade II diastolic dysfunction, mild aortic stenosis, mild mitral stenosis, mild to moderate mitral regurgitation, mildly dilated RV with normal systolic function, PA systolic pressure 46 mmHg.  - RHC (4/17): mean RA 9, PA 38/15 mean 27, mean PCWP 9, CI 2.5.  - Echo (5/17): EF 55-60%, mild LVH, mildly dilated RV with low normal systolic function, moderate TR, PASP 61 mmHg - Echo (1/19): EF 55-60%, moderate diastolic dysfunction, PASP 50 mmHg, mildly dilated RV, mild AS, mild-moderate MR. - RHC (9/19): mean RA 11, PA 34/12, mean PCWP 15, CI 2.85 11. CKD 12. Chronic LBBB 13. Anxiety 14. Carotid stenosis: Followed by [facility], >20% LICA stenosis 94/70.  She had left carotid stent in 2/15. Carotid  dopplers 8/15 with no significant disease. Carotid dopplers (4/16): < 40% RICA stenosis.  15. Cerebrovascular disease: CVA 2/14 with right posterior cerebral artery territory ischemic infarction. Cerebral angiogram in 6/14 showed 70% right vertebral ostial stenosis, 96-28% LICA stenosis, > 36% proximal left posterior cerebral artery stenosis, posterior communicating artery aneurysm.  Patient has had episodes of transient expressive aphasia.  Carotid dopplers (62/94) showed >76% LICA stenosis. She had a left carotid stent 2/15.  Possible CVA in 7/15. -  MRI/MRA in May 2018 that showed moderately severe stenosis of the supraclinoid segment of the right internal carotid artery, progressed since the 2016 MR angiogram and severe basilar artery stenosis.  16. Positional vertigo (suspected) 17. Palpitations: Holter (6/15) with rare PVCs/PACs.  - Event monitor (2/18): NSR, occasional PVCs 18. Dyspnea with Brilinta. 19. Melanoma: On face, s/p excision.   20. Aortic stenosis: Mild.  21. Holter (8/16) with no significant arrhythmias.  22. Lower extremity arterial dopplers (9/16) were normal.  23.  Sleep study (4/18): No significant OSA.  24. Atrial fibrillation: Paroxysmal  SH: Married, nonsmoker  FH: CAD  ROS: All systems reviewed and negative except as per HPI.   Current Outpatient Medications  Medication Sig Dispense Refill  . acetaminophen (TYLENOL) 500 MG tablet Take 1,000 mg by mouth every 6 (six) hours as needed for mild pain or headache.     . albuterol (PROVENTIL) (2.5 MG/3ML) 0.083% nebulizer solution Take 2.5 mg by nebulization every 6 (six) hours as needed for wheezing or shortness of breath.     Marland Kitchen albuterol (PROVENTIL,VENTOLIN) 90 MCG/ACT inhaler Inhale 2 puffs into the lungs every 4 (four) hours as needed for wheezing or shortness of breath.     . Alirocumab (PRALUENT) 150 MG/ML SOPN Inject 1 pen into the skin every 14 (fourteen) days. 6 pen 3  . ALPRAZolam (XANAX) 0.5 MG tablet Take  0.5-1 mg by mouth See admin instructions. Take 0.5 mg by mouth in the morning and take 0.5 mg by mouth at noon. Take 1 mg by mouth at bedtime.    Marland Kitchen amiodarone (PACERONE) 200 MG tablet Take 200 mg by mouth daily.    Marland Kitchen amLODipine (NORVASC) 10 MG tablet Take 10 mg by mouth at bedtime.     Marland Kitchen apixaban (ELIQUIS) 5 MG TABS tablet Take 1 tablet (5 mg total) by mouth 2 (two) times daily. 60 tablet 2  . aspirin EC 81 MG tablet Take 1 tablet (81 mg total) by mouth daily.    Marland Kitchen b complex vitamins capsule Take 1 capsule by mouth daily.    . Calcium Carbonate 1500 (600 CA) MG TABS Take 1,500 mg by mouth daily with breakfast.    . Cholecalciferol (VITAMIN D) 2000 UNITS tablet Take 2,000 Units by mouth daily with breakfast.     . cloNIDine (CATAPRES) 0.2 MG tablet Take 1 tablet (0.2 mg total) by mouth 2 (two) times daily. 60 tablet 11  . clopidogrel (PLAVIX) 75 MG tablet TAKE ONE (1) TABLET EACH DAY (Patient taking differently: Take 75 mg by mouth at bedtime. ) 90 tablet 2  . denosumab (PROLIA) 60 MG/ML SOLN injection Inject 60 mg into the skin every 6 (six) months. Administer in upper arm, thigh, or abdomen    . DULoxetine (CYMBALTA) 30 MG capsule Take 1 capsule (30 mg total) by mouth 2 (two) times daily. (Patient taking differently: Take 60 mg by mouth at bedtime. ) 180 capsule 3  . ezetimibe (ZETIA) 10 MG tablet TAKE ONE (1) TABLET EACH DAY (Patient taking differently: Take 10 mg by mouth at bedtime. ) 90 tablet 3  . Fluticasone-Salmeterol (ADVAIR DISKUS) 250-50 MCG/DOSE AEPB Inhale 1 puff into the lungs 2 (two) times daily.     Marland Kitchen gabapentin (NEURONTIN) 600 MG tablet Take 600-1,200 mg by mouth See admin instructions. Take 600 mg by mouth in the morning and take 600 mg by mouth in the afternoon. Take 1200 mg by mouth at bedtime.    Marland Kitchen HUMALOG KWIKPEN 100 UNIT/ML SOPN Inject 22-26 Units into the skin 3 (three) times daily before meals. Per sliding scale    . HYDROcodone-acetaminophen (NORCO/VICODIN) 5-325 MG per  tablet Take 1 tablet by mouth at bedtime.     Marland Kitchen ipratropium-albuterol (DUONEB) 0.5-2.5 (3) MG/3ML SOLN Take 3 mLs by nebulization every 4 (four) hours as needed (for severe shortness of breath or wheezing).     Marland Kitchen LANTUS SOLOSTAR 100 UNIT/ML Solostar Pen Inject 54 Units into the skin every morning.     . loperamide (  IMODIUM) 2 MG capsule Take 2 mg by mouth as needed for diarrhea or loose stools.    . meclizine (ANTIVERT) 12.5 MG tablet Take 12.5 mg by mouth 3 (three) times daily as needed for dizziness.    . methocarbamol (ROBAXIN) 500 MG tablet TAKE ONE (1) TABLET THREE (3) TIMES EACH DAY (Patient taking differently: Take 500 mg by mouth 2 (two) times daily. ) 60 tablet 4  . metolazone (ZAROXOLYN) 2.5 MG tablet Take 1 tablet (2.5 mg total) by mouth once a week. Take 1 Tablet once weekly on Mondays. 15 tablet 3  . metoprolol succinate (TOPROL-XL) 25 MG 24 hr tablet Take 1 tablet (25 mg total) by mouth 2 (two) times daily. 180 tablet 3  . Multiple Vitamin (MULTIVITAMIN) capsule Take 1 capsule by mouth daily.    . multivitamin-lutein (OCUVITE-LUTEIN) CAPS capsule Take 1 capsule by mouth daily.    . nitroGLYCERIN (NITROSTAT) 0.3 MG SL tablet Place 1 tablet (0.3 mg total) under the tongue every 5 (five) minutes x 3 doses as needed for chest pain. 9 tablet 1  . ondansetron (ZOFRAN) 4 MG tablet Take 1 tablet (4 mg total) by mouth 2 (two) times daily as needed. For nausea (Patient taking differently: Take 4 mg by mouth 2 (two) times daily. For nausea) 20 tablet 0  . OXYGEN Inhale 2 L into the lungs at bedtime.     . pantoprazole (PROTONIX) 40 MG tablet Take 1 tablet (40 mg total) by mouth 2 (two) times daily. 180 tablet 0  . potassium chloride SA (K-DUR,KLOR-CON) 20 MEQ tablet Take 1 tablet (20 mEq total) by mouth 2 (two) times daily. 60 tablet 1  . RANEXA 1000 MG SR tablet Take 1 tablet (1,000 mg total) by mouth 2 (two) times daily. 180 tablet 3  . rosuvastatin (CRESTOR) 40 MG tablet TAKE ONE (1) TABLET  EACH DAY (Patient taking differently: Take 40 mg by mouth at bedtime. ) 90 tablet 6  . torsemide (DEMADEX) 20 MG tablet Take 4 tablets (80 mg total) by mouth 2 (two) times daily. 240 tablet 3  . triamcinolone cream (KENALOG) 0.1 % Apply 1 application topically 2 (two) times daily.     . vitamin B-12 (CYANOCOBALAMIN) 1000 MCG tablet Take 1,000 mcg by mouth 3 (three) times a week.     . zinc gluconate 50 MG tablet Take 50 mg by mouth 3 (three) times a week.     . zolpidem (AMBIEN) 10 MG tablet Take 10 mg by mouth at bedtime.     Marland Kitchen losartan (COZAAR) 50 MG tablet Take 1 tablet (50 mg total) by mouth daily. 60 tablet 6   No current facility-administered medications for this encounter.     BP (!) 160/80   Pulse 75   Wt 132.7 kg (292 lb 9.6 oz)   SpO2 92%   BMI 50.22 kg/m  PHYSICAL EXAM General: NAD, obese Neck: JVP 8-9 cm, no thyromegaly or thyroid nodule.  Lungs: Clear to auscultation bilaterally with normal respiratory effort. CV: Nondisplaced PMI.  Heart regular S1/S2, no S3/S4, 2/6 SEM RUSB with clear S2.  1+ ankle edema.  No carotid bruit.  Normal pedal pulses.  Abdomen: Soft, nontender, no hepatosplenomegaly, no distention.  Skin: Intact without lesions or rashes.  Neurologic: Alert and oriented x 3.  Psych: Normal affect. Extremities: No clubbing or cyanosis.  HEENT: Normal.   Assessment/Plan: 1. CAD: Occluded native RCA and SVG-RCA.  Now status post opening of CTO RCA with 4 overlapping Xience DES in  5/15.  DES to sequential SVG-OM1/PLOM in 9/19.  Rare atypical chest pain.  - She will need to continue Plavix, but stop ASA 81 given anticoagulation.   - Continue Plavix hopefully for a year.   - She has not tolerated Imdur in the past.  Continue ranolazine and Toprol XL.  2. Chronic diastolic CHF:  She is volume overloaded on exam today, NYHA class III symptoms. Echo in 1/19 with EF 55-60%, moderate diastolic dysfunction.  - Increase Torsemide to 80 mg bid, BMET today and again in  10 days.  3. Hyperlipidemia: She remains on Zetia, Crestor and Praluent.  Lipids mildly higher than goal in 9/19, she is on maximal meds at this time.    4. Hypertension: BP mildly elevated today but in [id number] systolic when she checks at home.  With creatinine elevated on today's BMET at 1.6, will hold off on increasing losartan.   5. Cerebrovascular disease: She has history of CVA and had left carotid stent in 2/15. Followed by [facility].  Most recent MRA head showed severe stenosis of supraclinoid RICA and severe basilar artery stenosis.   6. Palpitations: Improved on higher Toprol XL.  Event monitor in 2/18 with few PVCs.  7. OHS/OSA: Cannot tolerate CPAP.  Uses oxygen with exertion and at night.  8. Atrial fibrillation: Paroxysmal, symptomatic.  She is in NSR today.   - Continue apixaban 5 mg bid. Will have to overlap with Plavix for at least 6 months.  - Continue amiodarone given severe symptoms when in atrial fibrillation.  Check CMET and TSH, she will need regular eye exam.   Followup in 6 wks with NP/PA.   Loralie Champagne 11/22/2017

## 2017-11-22 NOTE — Patient Instructions (Signed)
Increase Torsemide to 80 mg (4 tabs) Twice daily   Increase Losartan to 50 mg Twice daily   STOP Aspirin  Labs today  Labs in 1-2 weeks  Your physician recommends that you schedule a follow-up appointment in: 6 weeks

## 2017-11-22 NOTE — Telephone Encounter (Signed)
-----   Message from Larey Dresser, MD sent at 11/22/2017  3:37 PM EDT ----- Creatinine is a little higher.  We have increased her torsemide today along with losartan.  Please call her and tell her NOT to increase the losartan, keep at 50 mg daily.  She can increase the torsemide for now, but needs BMET soon (1 week).

## 2017-11-30 ENCOUNTER — Ambulatory Visit: Payer: Medicare Other | Admitting: Podiatry

## 2017-12-01 ENCOUNTER — Other Ambulatory Visit (HOSPITAL_COMMUNITY): Payer: Medicare Other

## 2017-12-05 ENCOUNTER — Other Ambulatory Visit (HOSPITAL_COMMUNITY): Payer: Medicare Other

## 2017-12-06 ENCOUNTER — Ambulatory Visit (HOSPITAL_COMMUNITY)
Admission: RE | Admit: 2017-12-06 | Discharge: 2017-12-06 | Disposition: A | Discharge status: Home / Self Care | Payer: Medicare Other | Source: Ambulatory Visit | Attending: Cardiology | Admitting: Cardiology

## 2017-12-06 DIAGNOSIS — I5032 Chronic diastolic (congestive) heart failure: Secondary | ICD-10-CM | POA: Insufficient documentation

## 2017-12-06 LAB — BASIC METABOLIC PANEL
Anion gap: 11 (ref 5–15)
BUN: 25 mg/dL — ABNORMAL HIGH (ref 8–23)
CO2: 31 mmol/L (ref 22–32)
Calcium: 9.6 mg/dL (ref 8.9–10.3)
Chloride: 101 mmol/L (ref 98–111)
Creatinine, Ser: 1.71 mg/dL — ABNORMAL HIGH (ref 0.44–1.00)
GFR calc Af Amer: 34 mL/min — ABNORMAL LOW (ref >60)
GFR calc non Af Amer: 29 mL/min — ABNORMAL LOW (ref >60)
Glucose, Bld: 129 mg/dL — ABNORMAL HIGH (ref 70–99)
Potassium: 4.7 mmol/L (ref 3.5–5.1)
Sodium: 143 mmol/L (ref 135–145)

## 2017-12-14 ENCOUNTER — Other Ambulatory Visit (HOSPITAL_COMMUNITY): Payer: Self-pay

## 2017-12-14 ENCOUNTER — Ambulatory Visit: Payer: Medicare Other | Admitting: Podiatry

## 2017-12-14 DIAGNOSIS — I5032 Chronic diastolic (congestive) heart failure: Secondary | ICD-10-CM

## 2017-12-14 MED ORDER — POTASSIUM CHLORIDE CRYS ER 20 MEQ PO TBCR: 20.0000 meq | EXTENDED_RELEASE_TABLET | Freq: Two times a day (BID) | ORAL | 1 refills | Status: DC

## 2017-12-14 MED ORDER — AMIODARONE HCL 200 MG PO TABS: 200.0000 mg | ORAL_TABLET | Freq: Every day | ORAL | 3 refills | Status: DC

## 2017-12-14 MED ORDER — APIXABAN 5 MG PO TABS: 5.0000 mg | ORAL_TABLET | Freq: Two times a day (BID) | ORAL | 3 refills | Status: DC

## 2017-12-27 NOTE — Progress Notes (Signed)
[name] [name] 72 y.o. female DOB 1945/11/18 [name] [id number]       Nutrition  No diagnosis found. Past Medical History:  Diagnosis Date  . Abnormality of gait 05/14/2014  . Anemia    hx  . Anginal pain ([place])   . Anxiety   . Asthma   . Bundle branch block, left    chronic/notes 07/18/2013  . Cancer [facility]) April 2016   BCC,  Melanoma  . CHF (congestive heart failure) ([place])   . Chronic bronchitis ([place])    "off and on all the time" (07/18/2013)  . Chronic insomnia 05/06/2015  . Chronic kidney disease    frequency, sses dr Jamal Maes every 4 to 6 months  . Chronic low back pain 08/24/2016  . Chronic lower back pain   . Claustrophobia   . Common migraine 05/14/2014  . Coronary artery disease   . Dysrhythmia   . GERD (gastroesophageal reflux disease)   . H/O hiatal hernia   . Headache    "probably 4 days/wk" (07/18/2013)  . Heart murmur   . Hyperlipidemia   . Hypertension   . Irregular heart beat   . Leg cramps    both legs at times  . Memory change 05/14/2014  . Mental disorder    hx of confusion  . Migraine    "used to have them really bad; kind of eased up now; down to 1 q couple months" (07/18/2013)  . Myocardial infarction ([place])    04/1999, 02/2000, 01/2005; 2011; 2014  . Neuromuscular disorder ([place])    ?  . Obesity 01-2010  . Obstructive sleep apnea    "can't wear machine; I have claustrophobia" (07/18/2013)  . Osteoarthritis    "knees and hands" (07/18/2013)  . Other and unspecified angina pectoris   . Peripheral vascular disease ([facility])    ? numbness, tingling arms and legs  . Pneumonia 2000's   "once"  . PONV (postoperative nausea and vomiting)   . Shortness of breath    with exertion  . Stroke ([place]) 03/22/12   right side brain; denies residual on 07/18/2013)  . Stroke [facility]) Oct. 2015   Affected pt.s balance  . Stroke ([place]) 10/2014  . Type II diabetes mellitus ([place])   . Ventral hernia    hx of  . Vomiting    persistent  . Vomiting blood    Meds reviewed.     Current Outpatient Medications (Endocrine & Metabolic):  .  denosumab (PROLIA) 60 MG/ML SOLN injection, Inject 60 mg into the skin every 6 (six) months. Administer in upper arm, thigh, or abdomen .  HUMALOG KWIKPEN 100 UNIT/ML SOPN, Inject 22-26 Units into the skin 3 (three) times daily before meals. Per sliding scale .  LANTUS SOLOSTAR 100 UNIT/ML Solostar Pen, Inject 54 Units into the skin every morning.   Current Outpatient Medications (Cardiovascular):  Marland Kitchen  Alirocumab (PRALUENT) 150 MG/ML SOPN, Inject 1 pen into the skin every 14 (fourteen) days. Marland Kitchen  amiodarone (PACERONE) 200 MG tablet, Take 1 tablet (200 mg total) by mouth daily. Marland Kitchen  amLODipine (NORVASC) 10 MG tablet, Take 10 mg by mouth at bedtime.  .  cloNIDine (CATAPRES) 0.2 MG tablet, Take 1 tablet (0.2 mg total) by mouth 2 (two) times daily. Marland Kitchen  ezetimibe (ZETIA) 10 MG tablet, TAKE ONE (1) TABLET EACH DAY (Patient taking differently: Take 10 mg by mouth at bedtime. ) .  losartan (COZAAR) 50 MG tablet, Take 1 tablet (50 mg total) by mouth daily. .  metolazone (  ZAROXOLYN) 2.5 MG tablet, Take 1 tablet (2.5 mg total) by mouth once a week. Take 1 Tablet once weekly on Mondays. .  metoprolol succinate (TOPROL-XL) 25 MG 24 hr tablet, Take 1 tablet (25 mg total) by mouth 2 (two) times daily. .  nitroGLYCERIN (NITROSTAT) 0.3 MG SL tablet, Place 1 tablet (0.3 mg total) under the tongue every 5 (five) minutes x 3 doses as needed for chest pain. Marland Kitchen  RANEXA 1000 MG SR tablet, Take 1 tablet (1,000 mg total) by mouth 2 (two) times daily. .  rosuvastatin (CRESTOR) 40 MG tablet, TAKE ONE (1) TABLET EACH DAY (Patient taking differently: Take 40 mg by mouth at bedtime. ) .  torsemide (DEMADEX) 20 MG tablet, Take 4 tablets (80 mg total) by mouth 2 (two) times daily.  Current Outpatient Medications (Respiratory):  .  albuterol (PROVENTIL) (2.5 MG/3ML) 0.083% nebulizer solution, Take 2.5 mg by nebulization every 6 (six) hours as needed for wheezing or  shortness of breath.  Marland Kitchen  albuterol (PROVENTIL,VENTOLIN) 90 MCG/ACT inhaler, Inhale 2 puffs into the lungs every 4 (four) hours as needed for wheezing or shortness of breath.  .  Fluticasone-Salmeterol (ADVAIR DISKUS) 250-50 MCG/DOSE AEPB, Inhale 1 puff into the lungs 2 (two) times daily.  Marland Kitchen  ipratropium-albuterol (DUONEB) 0.5-2.5 (3) MG/3ML SOLN, Take 3 mLs by nebulization every 4 (four) hours as needed (for severe shortness of breath or wheezing).   Current Outpatient Medications (Analgesics):  .  acetaminophen (TYLENOL) 500 MG tablet, Take 1,000 mg by mouth every 6 (six) hours as needed for mild pain or headache.  Marland Kitchen  aspirin EC 81 MG tablet, Take 1 tablet (81 mg total) by mouth daily. Marland Kitchen  HYDROcodone-acetaminophen (NORCO/VICODIN) 5-325 MG per tablet, Take 1 tablet by mouth at bedtime.   Current Outpatient Medications (Hematological):  .  apixaban (ELIQUIS) 5 MG TABS tablet, Take 1 tablet (5 mg total) by mouth 2 (two) times daily. .  clopidogrel (PLAVIX) 75 MG tablet, TAKE ONE (1) TABLET EACH DAY (Patient taking differently: Take 75 mg by mouth at bedtime. ) .  vitamin B-12 (CYANOCOBALAMIN) 1000 MCG tablet, Take 1,000 mcg by mouth 3 (three) times a week.   Current Outpatient Medications (Other):  Marland Kitchen  ALPRAZolam (XANAX) 0.5 MG tablet, Take 0.5-1 mg by mouth See admin instructions. Take 0.5 mg by mouth in the morning and take 0.5 mg by mouth at noon. Take 1 mg by mouth at bedtime. Marland Kitchen  b complex vitamins capsule, Take 1 capsule by mouth daily. .  Calcium Carbonate 1500 (600 CA) MG TABS, Take 1,500 mg by mouth daily with breakfast. .  Cholecalciferol (VITAMIN D) 2000 UNITS tablet, Take 2,000 Units by mouth daily with breakfast.  .  DULoxetine (CYMBALTA) 30 MG capsule, Take 1 capsule (30 mg total) by mouth 2 (two) times daily. (Patient taking differently: Take 60 mg by mouth at bedtime. ) .  gabapentin (NEURONTIN) 600 MG tablet, Take 600-1,200 mg by mouth See admin instructions. Take 600 mg by mouth in  the morning and take 600 mg by mouth in the afternoon. Take 1200 mg by mouth at bedtime. Marland Kitchen  loperamide (IMODIUM) 2 MG capsule, Take 2 mg by mouth as needed for diarrhea or loose stools. .  meclizine (ANTIVERT) 12.5 MG tablet, Take 12.5 mg by mouth 3 (three) times daily as needed for dizziness. .  methocarbamol (ROBAXIN) 500 MG tablet, TAKE ONE (1) TABLET THREE (3) TIMES EACH DAY (Patient taking differently: Take 500 mg by mouth 2 (two) times daily. ) .  Multiple Vitamin (MULTIVITAMIN) capsule, Take 1 capsule by mouth daily. .  multivitamin-lutein (OCUVITE-LUTEIN) CAPS capsule, Take 1 capsule by mouth daily. .  ondansetron (ZOFRAN) 4 MG tablet, Take 1 tablet (4 mg total) by mouth 2 (two) times daily as needed. For nausea (Patient taking differently: Take 4 mg by mouth 2 (two) times daily. For nausea) .  OXYGEN, Inhale 2 L into the lungs at bedtime.  .  pantoprazole (PROTONIX) 40 MG tablet, Take 1 tablet (40 mg total) by mouth 2 (two) times daily. .  potassium chloride SA (K-DUR,KLOR-CON) 20 MEQ tablet, Take 1 tablet (20 mEq total) by mouth 2 (two) times daily. Marland Kitchen  triamcinolone cream (KENALOG) 0.1 %, Apply 1 application topically 2 (two) times daily.  Marland Kitchen  zinc gluconate 50 MG tablet, Take 50 mg by mouth 3 (three) times a week.  .  zolpidem (AMBIEN) 10 MG tablet, Take 10 mg by mouth at bedtime.    HT: Ht Readings from Last 1 Encounters:  10/20/17 5\' 4"  (1.626 m)    WT: Wt Readings from Last 5 Encounters:  11/22/17 292 lb 9.6 oz (132.7 kg)  11/03/17 292 lb (132.5 kg)  10/23/17 285 lb 8 oz (129.5 kg)  10/13/17 297 lb (134.7 kg)  10/10/17 294 lb (133.4 kg)     BMI 50.24  09/06/17  Current tobacco use? No       Labs:  Lipid Panel     Component Value Date/Time   CHOL 181 10/21/2017 0903   CHOL 164 11/13/2014 1151   TRIG 157 (H) 10/21/2017 0903   HDL 42 10/21/2017 0903   HDL 56 11/13/2014 1151   CHOLHDL 4.3 10/21/2017 0903   VLDL 31 10/21/2017 0903   LDLCALC 108 (H) 10/21/2017 0903    LDLCALC 75 11/13/2014 1151   LDLDIRECT 156.7 02/06/2014 1014    Lab Results  Component Value Date   HGBA1C 8.0 (H) 10/21/2017   CBG (last 3)  No results for input(s): GLUCAP in the last 72 hours.  Nutrition Diagnosis ? Food-and nutrition-related knowledge deficit related to lack of exposure to information as related to diagnosis of: ? CVD ? Type 2 Diabetes  ? CHF  ? Obese  III = 40+ related to excessive energy intake as evidenced by a BMI 50.24  09/06/17  Nutrition Goal(s):  ? To be determined  Plan:  Pt to attend nutrition classes ? Nutrition I ? Nutrition II ? Portion Distortion  ? Diabetes Blitz ? Diabetes Q & A Will provide client-centered nutrition education as part of interdisciplinary care.   Monitor and evaluate progress toward nutrition goal with team.  Laurina Bustle, MS, RD, LDN 12/27/2017 6:40 PM

## 2017-12-28 ENCOUNTER — Telehealth (HOSPITAL_COMMUNITY): Payer: Self-pay

## 2018-01-03 ENCOUNTER — Ambulatory Visit (HOSPITAL_COMMUNITY)
Admission: RE | Admit: 2018-01-03 | Discharge: 2018-01-03 | Disposition: A | Discharge status: Home / Self Care | Payer: Medicare Other | Source: Ambulatory Visit | Attending: Cardiology | Admitting: Cardiology

## 2018-01-03 ENCOUNTER — Encounter (HOSPITAL_COMMUNITY): Payer: Self-pay

## 2018-01-03 VITALS — BP 144/88 | HR 50 | Wt 313.6 lb

## 2018-01-03 DIAGNOSIS — Z794 Long term (current) use of insulin: Secondary | ICD-10-CM | POA: Insufficient documentation

## 2018-01-03 DIAGNOSIS — Z951 Presence of aortocoronary bypass graft: Secondary | ICD-10-CM | POA: Diagnosis not present

## 2018-01-03 DIAGNOSIS — Z7982 Long term (current) use of aspirin: Secondary | ICD-10-CM | POA: Diagnosis not present

## 2018-01-03 DIAGNOSIS — I25119 Atherosclerotic heart disease of native coronary artery with unspecified angina pectoris: Secondary | ICD-10-CM

## 2018-01-03 DIAGNOSIS — F419 Anxiety disorder, unspecified: Secondary | ICD-10-CM | POA: Diagnosis not present

## 2018-01-03 DIAGNOSIS — G4733 Obstructive sleep apnea (adult) (pediatric): Secondary | ICD-10-CM | POA: Insufficient documentation

## 2018-01-03 DIAGNOSIS — E1122 Type 2 diabetes mellitus with diabetic chronic kidney disease: Secondary | ICD-10-CM | POA: Diagnosis not present

## 2018-01-03 DIAGNOSIS — I480 Paroxysmal atrial fibrillation: Secondary | ICD-10-CM

## 2018-01-03 DIAGNOSIS — Z79899 Other long term (current) drug therapy: Secondary | ICD-10-CM | POA: Diagnosis not present

## 2018-01-03 DIAGNOSIS — I447 Left bundle-branch block, unspecified: Secondary | ICD-10-CM | POA: Insufficient documentation

## 2018-01-03 DIAGNOSIS — Z6841 Body Mass Index (BMI) 40.0 and over, adult: Secondary | ICD-10-CM | POA: Insufficient documentation

## 2018-01-03 DIAGNOSIS — K219 Gastro-esophageal reflux disease without esophagitis: Secondary | ICD-10-CM | POA: Diagnosis not present

## 2018-01-03 DIAGNOSIS — Z8249 Family history of ischemic heart disease and other diseases of the circulatory system: Secondary | ICD-10-CM | POA: Insufficient documentation

## 2018-01-03 DIAGNOSIS — Z7902 Long term (current) use of antithrombotics/antiplatelets: Secondary | ICD-10-CM | POA: Diagnosis not present

## 2018-01-03 DIAGNOSIS — K449 Diaphragmatic hernia without obstruction or gangrene: Secondary | ICD-10-CM | POA: Diagnosis not present

## 2018-01-03 DIAGNOSIS — Z79891 Long term (current) use of opiate analgesic: Secondary | ICD-10-CM | POA: Insufficient documentation

## 2018-01-03 DIAGNOSIS — R9431 Abnormal electrocardiogram [ECG] [EKG]: Secondary | ICD-10-CM | POA: Insufficient documentation

## 2018-01-03 DIAGNOSIS — E785 Hyperlipidemia, unspecified: Secondary | ICD-10-CM | POA: Diagnosis not present

## 2018-01-03 DIAGNOSIS — Z8673 Personal history of transient ischemic attack (TIA), and cerebral infarction without residual deficits: Secondary | ICD-10-CM | POA: Insufficient documentation

## 2018-01-03 DIAGNOSIS — Z7901 Long term (current) use of anticoagulants: Secondary | ICD-10-CM | POA: Diagnosis not present

## 2018-01-03 DIAGNOSIS — I5032 Chronic diastolic (congestive) heart failure: Secondary | ICD-10-CM | POA: Diagnosis not present

## 2018-01-03 DIAGNOSIS — E1143 Type 2 diabetes mellitus with diabetic autonomic (poly)neuropathy: Secondary | ICD-10-CM | POA: Diagnosis not present

## 2018-01-03 DIAGNOSIS — I2510 Atherosclerotic heart disease of native coronary artery without angina pectoris: Secondary | ICD-10-CM | POA: Insufficient documentation

## 2018-01-03 DIAGNOSIS — I10 Essential (primary) hypertension: Secondary | ICD-10-CM | POA: Diagnosis not present

## 2018-01-03 DIAGNOSIS — Z9981 Dependence on supplemental oxygen: Secondary | ICD-10-CM | POA: Insufficient documentation

## 2018-01-03 DIAGNOSIS — Z791 Long term (current) use of non-steroidal anti-inflammatories (NSAID): Secondary | ICD-10-CM | POA: Insufficient documentation

## 2018-01-03 DIAGNOSIS — I130 Hypertensive heart and chronic kidney disease with heart failure and stage 1 through stage 4 chronic kidney disease, or unspecified chronic kidney disease: Secondary | ICD-10-CM | POA: Insufficient documentation

## 2018-01-03 MED ORDER — AMIODARONE HCL 100 MG PO TABS: 100.0000 mg | ORAL_TABLET | Freq: Every day | ORAL | 6 refills | Status: DC

## 2018-01-03 MED ORDER — TORSEMIDE 20 MG PO TABS: 80.0000 mg | ORAL_TABLET | Freq: Two times a day (BID) | ORAL | 3 refills | Status: DC

## 2018-01-03 NOTE — Patient Instructions (Signed)
DECREASE Amiodarone to 100 mg, one tab daily INCREASE Torsemide to 80 mg (4 tabs) twice a day  Your physician recommends that you schedule a follow-up appointment in: 2 weeks  in the [facility] Pract[facility] ([facility]NP) [facility]   Your physician recommends that you schedule a follow-up appointment in: 10-12 weeks with Dr [name]  Do the following things EVERYDAY: 1) Weigh yourself in the morning before breakfast. Write it down and keep it in a log. 2) Take your medicines as prescribed 3) Eat low salt foods-Limit salt (sodium) to 2000 mg per day.  4) Stay as active as you can everyday 5) Limit all fluids for the day to less than 2 liters At the [facility], you and your health needs are our priority. As part of our continuing mission to provide you with exceptional heart care, we have created designated Provider Care Teams. These Care Teams include your primary Cardiologist (physician) and Advanced Practice Providers (APPs- Physician Assistants and Nurse Practitioners) who all work together to provide you with the care you need, when you need it.   You may see any of the following providers on your designated Care Team at your next follow up: Marland Kitchen Dr Glori Bickers . Dr Loralie Champagne . Darrick Grinder, NP . Lillia Mountain, NP . Rebecca Eaton

## 2018-01-03 NOTE — Progress Notes (Signed)
Patient ID: [name] [name], female   DOB: [birth year], 72 y.o.   [name]: [id number] PCP: Dr. Sandi Mariscal Cardiology: Dr. [name]  72 y.o. with history of CAD s/p CABG, diastolic CHF, and cerebrovascular disease with history of CVA presents for followup of CHF and diastolic CHF. She had CABG x 5 in 12/11.  Prior to the CABG she had multiple PCIs.  She had a CVA in 2/14 that presented as visual loss.  She had a left carotid stent in 2/15.   Left heart cath done in Sept. 2014. This showed patent SVG-D, LIMA-LAD, and sequential SVG-OM branches but SVG-RCA and the native RCA were both totally occluded. It was felt aht her increased symptoms coincided with occlusion of SVG-RCA.  She was started on Imdur to see if this would help with the chest pain and dyspnea.  However, she feels like Imdur causes leg cramps and does not think that she can take it.  So ranolazine 500 mg bid started and titrated this up to 1000 mg bid.  This helped some but not markedly.  Therefore, sent to  Dr. Irish Lack to address opening RCA CTO. He was able to do this in 5/15 with 4 overlapping Xience DES.  This led to resolution of exertional chest pain.    [name] [name] was started on Brilinta after PCI.  She became much more short of breath after starting on Brilinta. Per Dr Delano Metz stopped and replaced with Plavix.  Dyspnea improved off Brilinta. Given increasing exertional dyspnea and chest pain,  RHC/LHC was done in 4/17.  This showed stable CAD with no interventional target.  Left and right heart filling pressures were not significantly increased.  Medical management.   She had an MRI/MRA in May 2018 that showed moderately severe stenosis of the supraclinoid segment of the right internal carotid artery, progressed since the 2016 MR angiogram and severe basilar artery stenosis.   She developed recurrent exertional chest pain and had LHC in 6/18, showing 4/5 grafts patent with patent native RCA (similar to prior cath, no changes).     Echo 1/19 with EF 55-60%, moderate diastolic dysfunction, PASP 50 mmHg, mildly dilated RV, mild AS, mild-moderate MR.   She reported increased dyspnea and chest pain and had RHC/LHC in 9/19.  She had DES to sequential SVG-OM1/PLOM.  While hospitalized, she was noted to have runs of atrial fibrillation.  She was very symptomatic with the atrial fibrillation.  Eliquis and amiodarone were started.   Today she returns for HF follow up. Overall feeling fine. Ongoing SOB with exertion. Denies PND/Orthopnea. No chest pain. No bleeding problems. Appetite ok. No fever or chills. Weight at home has been trending up from 296--->313 pounds. Taking all medications.   Labs (6/14): K 3.8, creatinine 0.71 Labs (8/14): BNP 249, LDL 166 Labs (9/14): K 4.7, creatinine 0.7 Labs (9/14): K 4.6, creatinine 0.9, BNP 109 Labs (1/15): K 4.5, creatinine 0.73, LDL 212, HCT 43.4, TSH normal, BNP 138 Labs (6/15): K 4.7, creatinine 1.17, LDL 100, HDL 37, TGs 363, BNP 39 Labs (10/15): K 4.6, creatinine 1.2 Labs (1/16): LDL 157, HDL 43, K 5.2, creatinine 0.95 Labs (2/16): K 4.5, creatinine 1.08, BNP 113 Labs (4/16): LDL 50, HDL 56, TGs 179 Labs (9/16): K 5.3, creatinine 1.09 Labs (10/16): LDL 75, HDL 56 Labs (2/17): K 5.2, creatinine 1.07 Labs (4/17): K 5.1, creatinine 1.07 Labs (5/17): K 4.5, creatinine 1.01, LDL 96, HDL 56, BNP 70 Labs (03/2016) K 4.9, creatinine 1.07 Labs (7/18): K  4.9, creatinine 1.15 Labs (11/18): K 5.1, creatinine 1.11, LDL 36 Labs (1/19): K 4.9, creatinine 1.22 Labs (9/19): LDL 108 Labs (10/19): hgb 14.5, K 4.3, creatinine 1.03 Labs 12/06/2017: K 4.7 Creatinine 1.7  ECG (personally reviewed): NSR, LBBB  PMH: 1. Diabetic gastroparesis 2. Type II diabetes 3. HTN 4. Morbid obesity 5. CAD: s/p CABG in 12/11 after prior PCIs.  LIMA-LAD, SVG-D, seq SVG-OM1 and OM2, SVG-PDA. Adenosine Cardiolite (8/14) with EF 53% and a small reversible apical defect with a medium, partially reversible inferior  defect.  LHC (9/14) with patent SVG-D, LIMA-LAD (50% distal LAD), and sequential SVG-OM branches; the SVG-RCA and the native RCA were occluded.  This was managed medically initially, but with ongoing exertional chest pain, it was decided to open CTO.  Patient had CTO opening with 4 overlapping Xience DES in the RCA in 5/15.   - LHC (4/17): SVG-D patent, LIMA-LAD patent, distal LAD with several 40-50% stenoses, sequential SVG-OM1 and PLOM patent with 50% proximal stenosis (not flow limiting), SVG-RCA TO with patent RCA stents.  - LHC (6/18): 4/5 grafts patent (SVG-RCA TO, same as past), RCA stents patent => no change.  - LHC (9/19): Sequential SVG-PLOM/OM1 with 80% proximal stenosis, s/p DES.  6. Atypical migraines 7. OHS/OSA: Intolerant of CPAP. Uses oxygen with exertion and at night.  8. GERD with hiatal hernia 9. OA 10. Diastolic CHF: Echo (02/40) with EF 50-55%, grade II diastolic dysfunction, mild-moderate MR.  Echo (8/14) with EF 55-60%, grade II diastolic dysfunction, mildly increased aortic valve gradient (mean 12 mmHg) but valve opens well, mild MR and mild RV dilation.  Echo (9/15) with EF 60-65%, grade II diastolic dysfunction, mild aortic stenosis, mild mitral stenosis, mild to moderate mitral regurgitation, mildly dilated RV with normal systolic function, PA systolic pressure 46 mmHg.  - RHC (4/17): mean RA 9, PA 38/15 mean 27, mean PCWP 9, CI 2.5.  - Echo (5/17): EF 55-60%, mild LVH, mildly dilated RV with low normal systolic function, moderate TR, PASP 61 mmHg - Echo (1/19): EF 55-60%, moderate diastolic dysfunction, PASP 50 mmHg, mildly dilated RV, mild AS, mild-moderate MR. - RHC (9/19): mean RA 11, PA 34/12, mean PCWP 15, CI 2.85 11. CKD 12. Chronic LBBB 13. Anxiety 14. Carotid stenosis: Followed by [facility], >97% LICA stenosis 35/32.  She had left carotid stent in 2/15. Carotid dopplers 8/15 with no significant disease. Carotid dopplers (4/16): < 40% RICA stenosis.  15. Cerebrovascular  disease: CVA 2/14 with right posterior cerebral artery territory ischemic infarction. Cerebral angiogram in 6/14 showed 70% right vertebral ostial stenosis, 99-24% LICA stenosis, > 26% proximal left posterior cerebral artery stenosis, posterior communicating artery aneurysm.  Patient has had episodes of transient expressive aphasia.  Carotid dopplers (83/41) showed >96% LICA stenosis. She had a left carotid stent 2/15.  Possible CVA in 7/15. -  MRI/MRA in May 2018 that showed moderately severe stenosis of the supraclinoid segment of the right internal carotid artery, progressed since the 2016 MR angiogram and severe basilar artery stenosis.  16. Positional vertigo (suspected) 17. Palpitations: Holter (6/15) with rare PVCs/PACs.  - Event monitor (2/18): NSR, occasional PVCs 18. Dyspnea with Brilinta. 19. Melanoma: On face, s/p excision.   20. Aortic stenosis: Mild.  21. Holter (8/16) with no significant arrhythmias.  22. Lower extremity arterial dopplers (9/16) were normal.  23. Sleep study (4/18): No significant OSA.  24. Atrial fibrillation: Paroxysmal  SH: Married, nonsmoker  FH: CAD  ROS: All systems reviewed and negative except as per  HPI.   Current Outpatient Medications  Medication Sig Dispense Refill  . acetaminophen (TYLENOL) 500 MG tablet Take 1,000 mg by mouth every 6 (six) hours as needed for mild pain or headache.     . albuterol (PROVENTIL) (2.5 MG/3ML) 0.083% nebulizer solution Take 2.5 mg by nebulization every 6 (six) hours as needed for wheezing or shortness of breath.     Marland Kitchen albuterol (PROVENTIL,VENTOLIN) 90 MCG/ACT inhaler Inhale 2 puffs into the lungs every 4 (four) hours as needed for wheezing or shortness of breath.     . Alirocumab (PRALUENT) 150 MG/ML SOPN Inject 1 pen into the skin every 14 (fourteen) days. 6 pen 3  . ALPRAZolam (XANAX) 0.5 MG tablet Take 0.5-1 mg by mouth See admin instructions. Take 0.5 mg by mouth in the morning and take 0.5 mg by mouth at noon.  Take 1 mg by mouth at bedtime.    Marland Kitchen amiodarone (PACERONE) 200 MG tablet Take 1 tablet (200 mg total) by mouth daily. 30 tablet 3  . amLODipine (NORVASC) 10 MG tablet Take 10 mg by mouth at bedtime.     Marland Kitchen apixaban (ELIQUIS) 5 MG TABS tablet Take 1 tablet (5 mg total) by mouth 2 (two) times daily. 60 tablet 3  . aspirin EC 81 MG tablet Take 1 tablet (81 mg total) by mouth daily.    Marland Kitchen b complex vitamins capsule Take 1 capsule by mouth daily.    . Calcium Carbonate 1500 (600 CA) MG TABS Take 1,500 mg by mouth daily with breakfast.    . Cholecalciferol (VITAMIN D) 2000 UNITS tablet Take 2,000 Units by mouth daily with breakfast.     . cloNIDine (CATAPRES) 0.2 MG tablet Take 1 tablet (0.2 mg total) by mouth 2 (two) times daily. 60 tablet 11  . clopidogrel (PLAVIX) 75 MG tablet TAKE ONE (1) TABLET EACH DAY (Patient taking differently: Take 75 mg by mouth at bedtime. ) 90 tablet 2  . denosumab (PROLIA) 60 MG/ML SOLN injection Inject 60 mg into the skin every 6 (six) months. Administer in upper arm, thigh, or abdomen    . DULoxetine (CYMBALTA) 30 MG capsule Take 1 capsule (30 mg total) by mouth 2 (two) times daily. (Patient taking differently: Take 60 mg by mouth at bedtime. ) 180 capsule 3  . ezetimibe (ZETIA) 10 MG tablet TAKE ONE (1) TABLET EACH DAY (Patient taking differently: Take 10 mg by mouth at bedtime. ) 90 tablet 3  . Fluticasone-Salmeterol (ADVAIR DISKUS) 250-50 MCG/DOSE AEPB Inhale 1 puff into the lungs 2 (two) times daily.     Marland Kitchen gabapentin (NEURONTIN) 600 MG tablet Take 600-1,200 mg by mouth See admin instructions. Take 600 mg by mouth in the morning and take 600 mg by mouth in the afternoon. Take 1200 mg by mouth at bedtime.    Marland Kitchen HUMALOG KWIKPEN 100 UNIT/ML SOPN Inject 22-26 Units into the skin 3 (three) times daily before meals. Per sliding scale    . HYDROcodone-acetaminophen (NORCO/VICODIN) 5-325 MG per tablet Take 1 tablet by mouth at bedtime.     Marland Kitchen ipratropium-albuterol (DUONEB) 0.5-2.5  (3) MG/3ML SOLN Take 3 mLs by nebulization every 4 (four) hours as needed (for severe shortness of breath or wheezing).     Marland Kitchen LANTUS SOLOSTAR 100 UNIT/ML Solostar Pen Inject 54 Units into the skin every morning.     . loperamide (IMODIUM) 2 MG capsule Take 2 mg by mouth as needed for diarrhea or loose stools.    Marland Kitchen losartan (COZAAR) 50 MG  tablet Take 1 tablet (50 mg total) by mouth daily. 60 tablet 6  . meclizine (ANTIVERT) 12.5 MG tablet Take 12.5 mg by mouth 3 (three) times daily as needed for dizziness.    . methocarbamol (ROBAXIN) 500 MG tablet TAKE ONE (1) TABLET THREE (3) TIMES EACH DAY (Patient taking differently: Take 500 mg by mouth 2 (two) times daily. ) 60 tablet 4  . metolazone (ZAROXOLYN) 2.5 MG tablet Take 1 tablet (2.5 mg total) by mouth once a week. Take 1 Tablet once weekly on Mondays. 15 tablet 3  . metoprolol succinate (TOPROL-XL) 25 MG 24 hr tablet Take 1 tablet (25 mg total) by mouth 2 (two) times daily. 180 tablet 3  . Multiple Vitamin (MULTIVITAMIN) capsule Take 1 capsule by mouth daily.    . multivitamin-lutein (OCUVITE-LUTEIN) CAPS capsule Take 1 capsule by mouth daily.    . nitroGLYCERIN (NITROSTAT) 0.3 MG SL tablet Place 1 tablet (0.3 mg total) under the tongue every 5 (five) minutes x 3 doses as needed for chest pain. 9 tablet 1  . ondansetron (ZOFRAN) 4 MG tablet Take 1 tablet (4 mg total) by mouth 2 (two) times daily as needed. For nausea (Patient taking differently: Take 4 mg by mouth 2 (two) times daily. For nausea) 20 tablet 0  . OXYGEN Inhale 2 L into the lungs at bedtime.     . pantoprazole (PROTONIX) 40 MG tablet Take 1 tablet (40 mg total) by mouth 2 (two) times daily. 180 tablet 0  . potassium chloride SA (K-DUR,KLOR-CON) 20 MEQ tablet Take 1 tablet (20 mEq total) by mouth 2 (two) times daily. 60 tablet 1  . RANEXA 1000 MG SR tablet Take 1 tablet (1,000 mg total) by mouth 2 (two) times daily. 180 tablet 3  . rosuvastatin (CRESTOR) 40 MG tablet TAKE ONE (1) TABLET  EACH DAY (Patient taking differently: Take 40 mg by mouth at bedtime. ) 90 tablet 6  . torsemide (DEMADEX) 20 MG tablet Take 4 tablets (80 mg total) by mouth 2 (two) times daily. 240 tablet 3  . triamcinolone cream (KENALOG) 0.1 % Apply 1 application topically 2 (two) times daily.     . vitamin B-12 (CYANOCOBALAMIN) 1000 MCG tablet Take 1,000 mcg by mouth 3 (three) times a week.     . zinc gluconate 50 MG tablet Take 50 mg by mouth 3 (three) times a week.     . zolpidem (AMBIEN) 10 MG tablet Take 10 mg by mouth at bedtime.      No current facility-administered medications for this encounter.     BP (!) 144/88   Pulse (!) 50   Wt (!) 142.2 kg (313 lb 9.6 oz)   SpO2 90%   BMI 53.83 kg/m   Wt Readings from Last 3 Encounters:  01/03/18 (!) 142.2 kg (313 lb 9.6 oz)  11/22/17 132.7 kg (292 lb 9.6 oz)  11/03/17 132.5 kg (292 lb)    PHYSICAL EXAM General:   No resp difficulty HEENT: normal Neck: supple. no JVD. Carotids 2+ bilat; no bruits. No lymphadenopathy or thryomegaly appreciated. Cor: PMI nondisplaced. Regular rate & rhythm. No rubs, gallops or murmurs. RUSB 2/6  Lungs: clear Abdomen: obses, soft, nontender, nondistended. No hepatosplenomegaly. No bruits or masses. Good bowel sounds. Extremities: no cyanosis, clubbing, rash, R and LLE 1+ edema Neuro: alert & orientedx3, cranial nerves grossly intact. moves all 4 extremities w/o difficulty. Affect pleasant  EKG: Sinus Brady 46 bpm with occasional PVC  Assessment/Plan: 1. CAD: Occluded native RCA and  SVG-RCA.  Now status post opening of CTO RCA with 4 overlapping Xience DES in 5/15.  DES to sequential SVG-OM1/PLOM in 9/19.   - No s/s ischemia .  - She will need to continue Plavix and eliquis   - Continue Plavix hopefully for a year (10/2018)   - She has not tolerated Imdur in the past. Continue ranolazine and Toprol XL.  - Cardiac Rehab starts later this week.  2. Chronic diastolic CHF:   Echo in 3/88 with EF 55-60%, moderate  diastolic dysfunction.  -NYHA III. Volume status elevated. Increase torsemide to 80 mg twice a day. Will need to check BMET next visit.  - We discussed possible cardiomems and she interested. Chest circumference 140 cm. Today she signed the application for cardiomems. I think this may helpful to manage her volume.  3. Hyperlipidemia: She remains on Zetia, Crestor and Praluent.  Lipids mildly higher than goal in 9/19, she is on maximal meds at this time.    4. Hypertension: Stable. Continue current regimen.  5. Cerebrovascular disease: She has history of CVA and had left carotid stent in 2/15. Followed by [facility].  Most recent MRA head showed severe stenosis of supraclinoid RICA and severe basilar artery stenosis.   6. Palpitations:Resolved. Continue Toprol XL.  Event monitor in 2/18 with few PVCs.  7. OHS/OSA: Cannot tolerate CPAP.  Uses oxygen with exertion and at night.  8. Atrial fibrillation: Paroxysmal, symptomatic.  Today she is in sinus brady. Cut back amiodarone to 100 mg daily.   - Continue apixaban 5 mg bid.  -Recent TSH, CMET stable. She will need yearly eye exams.  9. Obesity Body mass index is 53.83 kg/m.' Discussed portion control. Needs to lose weight.   Check BMET today. Follow up in the [facility] clinic in 2 weeks and  Dr [name] 8-10 weeks.  Place Cardiomems once approved.   Cathey Fredenburg NP-C  01/03/2018

## 2018-01-03 NOTE — H&P (View-Only) (Signed)
Patient ID: [name] [name], female   DOB: January 20, 1946, 72 y.o.   [name]: [id number] PCP: Dr. Sandi Mariscal Cardiology: Dr. [name]  72 y.o. with history of CAD s/p CABG, diastolic CHF, and cerebrovascular disease with history of CVA presents for followup of CHF and diastolic CHF. She had CABG x 5 in 12/11.  Prior to the CABG she had multiple PCIs.  She had a CVA in 2/14 that presented as visual loss.  She had a left carotid stent in 2/15.   Left heart cath done in Sept. 2014. This showed patent SVG-D, LIMA-LAD, and sequential SVG-OM branches but SVG-RCA and the native RCA were both totally occluded. It was felt aht her increased symptoms coincided with occlusion of SVG-RCA.  She was started on Imdur to see if this would help with the chest pain and dyspnea.  However, she feels like Imdur causes leg cramps and does not think that she can take it.  So ranolazine 500 mg bid started and titrated this up to 1000 mg bid.  This helped some but not markedly.  Therefore, sent to  Dr. Irish Lack to address opening RCA CTO. He was able to do this in 5/15 with 4 overlapping Xience DES.  This led to resolution of exertional chest pain.    [name] [name] was started on Brilinta after PCI.  She became much more short of breath after starting on Brilinta. Per Dr Delano Metz stopped and replaced with Plavix.  Dyspnea improved off Brilinta. Given increasing exertional dyspnea and chest pain,  RHC/LHC was done in 4/17.  This showed stable CAD with no interventional target.  Left and right heart filling pressures were not significantly increased.  Medical management.   She had an MRI/MRA in May 2018 that showed moderately severe stenosis of the supraclinoid segment of the right internal carotid artery, progressed since the 2016 MR angiogram and severe basilar artery stenosis.   She developed recurrent exertional chest pain and had LHC in 6/18, showing 4/5 grafts patent with patent native RCA (similar to prior cath, no changes).     Echo 1/19 with EF 55-60%, moderate diastolic dysfunction, PASP 50 mmHg, mildly dilated RV, mild AS, mild-moderate MR.   She reported increased dyspnea and chest pain and had RHC/LHC in 9/19.  She had DES to sequential SVG-OM1/PLOM.  While hospitalized, she was noted to have runs of atrial fibrillation.  She was very symptomatic with the atrial fibrillation.  Eliquis and amiodarone were started.   Today she returns for HF follow up. Overall feeling fine. Ongoing SOB with exertion. Denies PND/Orthopnea. No chest pain. No bleeding problems. Appetite ok. No fever or chills. Weight at home has been trending up from 296--->313 pounds. Taking all medications.   Labs (6/14): K 3.8, creatinine 0.71 Labs (8/14): BNP 249, LDL 166 Labs (9/14): K 4.7, creatinine 0.7 Labs (9/14): K 4.6, creatinine 0.9, BNP 109 Labs (1/15): K 4.5, creatinine 0.73, LDL 212, HCT 43.4, TSH normal, BNP 138 Labs (6/15): K 4.7, creatinine 1.17, LDL 100, HDL 37, TGs 363, BNP 39 Labs (10/15): K 4.6, creatinine 1.2 Labs (1/16): LDL 157, HDL 43, K 5.2, creatinine 0.95 Labs (2/16): K 4.5, creatinine 1.08, BNP 113 Labs (4/16): LDL 50, HDL 56, TGs 179 Labs (9/16): K 5.3, creatinine 1.09 Labs (10/16): LDL 75, HDL 56 Labs (2/17): K 5.2, creatinine 1.07 Labs (4/17): K 5.1, creatinine 1.07 Labs (5/17): K 4.5, creatinine 1.01, LDL 96, HDL 56, BNP 70 Labs (03/2016) K 4.9, creatinine 1.07 Labs (7/18): K  4.9, creatinine 1.15 Labs (11/18): K 5.1, creatinine 1.11, LDL 36 Labs (1/19): K 4.9, creatinine 1.22 Labs (9/19): LDL 108 Labs (10/19): hgb 14.5, K 4.3, creatinine 1.03 Labs 12/06/2017: K 4.7 Creatinine 1.7  ECG (personally reviewed): NSR, LBBB  PMH: 1. Diabetic gastroparesis 2. Type II diabetes 3. HTN 4. Morbid obesity 5. CAD: s/p CABG in 12/11 after prior PCIs.  LIMA-LAD, SVG-D, seq SVG-OM1 and OM2, SVG-PDA. Adenosine Cardiolite (8/14) with EF 53% and a small reversible apical defect with a medium, partially reversible inferior  defect.  LHC (9/14) with patent SVG-D, LIMA-LAD (50% distal LAD), and sequential SVG-OM branches; the SVG-RCA and the native RCA were occluded.  This was managed medically initially, but with ongoing exertional chest pain, it was decided to open CTO.  Patient had CTO opening with 4 overlapping Xience DES in the RCA in 5/15.   - LHC (4/17): SVG-D patent, LIMA-LAD patent, distal LAD with several 40-50% stenoses, sequential SVG-OM1 and PLOM patent with 50% proximal stenosis (not flow limiting), SVG-RCA TO with patent RCA stents.  - LHC (6/18): 4/5 grafts patent (SVG-RCA TO, same as past), RCA stents patent => no change.  - LHC (9/19): Sequential SVG-PLOM/OM1 with 80% proximal stenosis, s/p DES.  6. Atypical migraines 7. OHS/OSA: Intolerant of CPAP. Uses oxygen with exertion and at night.  8. GERD with hiatal hernia 9. OA 10. Diastolic CHF: Echo (59/93) with EF 50-55%, grade II diastolic dysfunction, mild-moderate MR.  Echo (8/14) with EF 55-60%, grade II diastolic dysfunction, mildly increased aortic valve gradient (mean 12 mmHg) but valve opens well, mild MR and mild RV dilation.  Echo (9/15) with EF 60-65%, grade II diastolic dysfunction, mild aortic stenosis, mild mitral stenosis, mild to moderate mitral regurgitation, mildly dilated RV with normal systolic function, PA systolic pressure 46 mmHg.  - RHC (4/17): mean RA 9, PA 38/15 mean 27, mean PCWP 9, CI 2.5.  - Echo (5/17): EF 55-60%, mild LVH, mildly dilated RV with low normal systolic function, moderate TR, PASP 61 mmHg - Echo (1/19): EF 55-60%, moderate diastolic dysfunction, PASP 50 mmHg, mildly dilated RV, mild AS, mild-moderate MR. - RHC (9/19): mean RA 11, PA 34/12, mean PCWP 15, CI 2.85 11. CKD 12. Chronic LBBB 13. Anxiety 14. Carotid stenosis: Followed by [facility], >57% LICA stenosis 01/77.  She had left carotid stent in 2/15. Carotid dopplers 8/15 with no significant disease. Carotid dopplers (4/16): < 40% RICA stenosis.  15. Cerebrovascular  disease: CVA 2/14 with right posterior cerebral artery territory ischemic infarction. Cerebral angiogram in 6/14 showed 70% right vertebral ostial stenosis, 93-90% LICA stenosis, > 30% proximal left posterior cerebral artery stenosis, posterior communicating artery aneurysm.  Patient has had episodes of transient expressive aphasia.  Carotid dopplers (09/23) showed >30% LICA stenosis. She had a left carotid stent 2/15.  Possible CVA in 7/15. -  MRI/MRA in May 2018 that showed moderately severe stenosis of the supraclinoid segment of the right internal carotid artery, progressed since the 2016 MR angiogram and severe basilar artery stenosis.  16. Positional vertigo (suspected) 17. Palpitations: Holter (6/15) with rare PVCs/PACs.  - Event monitor (2/18): NSR, occasional PVCs 18. Dyspnea with Brilinta. 19. Melanoma: On face, s/p excision.   20. Aortic stenosis: Mild.  21. Holter (8/16) with no significant arrhythmias.  22. Lower extremity arterial dopplers (9/16) were normal.  23. Sleep study (4/18): No significant OSA.  24. Atrial fibrillation: Paroxysmal  SH: Married, nonsmoker  FH: CAD  ROS: All systems reviewed and negative except as per  HPI.   Current Outpatient Medications  Medication Sig Dispense Refill  . acetaminophen (TYLENOL) 500 MG tablet Take 1,000 mg by mouth every 6 (six) hours as needed for mild pain or headache.     . albuterol (PROVENTIL) (2.5 MG/3ML) 0.083% nebulizer solution Take 2.5 mg by nebulization every 6 (six) hours as needed for wheezing or shortness of breath.     Marland Kitchen albuterol (PROVENTIL,VENTOLIN) 90 MCG/ACT inhaler Inhale 2 puffs into the lungs every 4 (four) hours as needed for wheezing or shortness of breath.     . Alirocumab (PRALUENT) 150 MG/ML SOPN Inject 1 pen into the skin every 14 (fourteen) days. 6 pen 3  . ALPRAZolam (XANAX) 0.5 MG tablet Take 0.5-1 mg by mouth See admin instructions. Take 0.5 mg by mouth in the morning and take 0.5 mg by mouth at noon.  Take 1 mg by mouth at bedtime.    Marland Kitchen amiodarone (PACERONE) 200 MG tablet Take 1 tablet (200 mg total) by mouth daily. 30 tablet 3  . amLODipine (NORVASC) 10 MG tablet Take 10 mg by mouth at bedtime.     Marland Kitchen apixaban (ELIQUIS) 5 MG TABS tablet Take 1 tablet (5 mg total) by mouth 2 (two) times daily. 60 tablet 3  . aspirin EC 81 MG tablet Take 1 tablet (81 mg total) by mouth daily.    Marland Kitchen b complex vitamins capsule Take 1 capsule by mouth daily.    . Calcium Carbonate 1500 (600 CA) MG TABS Take 1,500 mg by mouth daily with breakfast.    . Cholecalciferol (VITAMIN D) 2000 UNITS tablet Take 2,000 Units by mouth daily with breakfast.     . cloNIDine (CATAPRES) 0.2 MG tablet Take 1 tablet (0.2 mg total) by mouth 2 (two) times daily. 60 tablet 11  . clopidogrel (PLAVIX) 75 MG tablet TAKE ONE (1) TABLET EACH DAY (Patient taking differently: Take 75 mg by mouth at bedtime. ) 90 tablet 2  . denosumab (PROLIA) 60 MG/ML SOLN injection Inject 60 mg into the skin every 6 (six) months. Administer in upper arm, thigh, or abdomen    . DULoxetine (CYMBALTA) 30 MG capsule Take 1 capsule (30 mg total) by mouth 2 (two) times daily. (Patient taking differently: Take 60 mg by mouth at bedtime. ) 180 capsule 3  . ezetimibe (ZETIA) 10 MG tablet TAKE ONE (1) TABLET EACH DAY (Patient taking differently: Take 10 mg by mouth at bedtime. ) 90 tablet 3  . Fluticasone-Salmeterol (ADVAIR DISKUS) 250-50 MCG/DOSE AEPB Inhale 1 puff into the lungs 2 (two) times daily.     Marland Kitchen gabapentin (NEURONTIN) 600 MG tablet Take 600-1,200 mg by mouth See admin instructions. Take 600 mg by mouth in the morning and take 600 mg by mouth in the afternoon. Take 1200 mg by mouth at bedtime.    Marland Kitchen HUMALOG KWIKPEN 100 UNIT/ML SOPN Inject 22-26 Units into the skin 3 (three) times daily before meals. Per sliding scale    . HYDROcodone-acetaminophen (NORCO/VICODIN) 5-325 MG per tablet Take 1 tablet by mouth at bedtime.     Marland Kitchen ipratropium-albuterol (DUONEB) 0.5-2.5  (3) MG/3ML SOLN Take 3 mLs by nebulization every 4 (four) hours as needed (for severe shortness of breath or wheezing).     Marland Kitchen LANTUS SOLOSTAR 100 UNIT/ML Solostar Pen Inject 54 Units into the skin every morning.     . loperamide (IMODIUM) 2 MG capsule Take 2 mg by mouth as needed for diarrhea or loose stools.    Marland Kitchen losartan (COZAAR) 50 MG  tablet Take 1 tablet (50 mg total) by mouth daily. 60 tablet 6  . meclizine (ANTIVERT) 12.5 MG tablet Take 12.5 mg by mouth 3 (three) times daily as needed for dizziness.    . methocarbamol (ROBAXIN) 500 MG tablet TAKE ONE (1) TABLET THREE (3) TIMES EACH DAY (Patient taking differently: Take 500 mg by mouth 2 (two) times daily. ) 60 tablet 4  . metolazone (ZAROXOLYN) 2.5 MG tablet Take 1 tablet (2.5 mg total) by mouth once a week. Take 1 Tablet once weekly on Mondays. 15 tablet 3  . metoprolol succinate (TOPROL-XL) 25 MG 24 hr tablet Take 1 tablet (25 mg total) by mouth 2 (two) times daily. 180 tablet 3  . Multiple Vitamin (MULTIVITAMIN) capsule Take 1 capsule by mouth daily.    . multivitamin-lutein (OCUVITE-LUTEIN) CAPS capsule Take 1 capsule by mouth daily.    . nitroGLYCERIN (NITROSTAT) 0.3 MG SL tablet Place 1 tablet (0.3 mg total) under the tongue every 5 (five) minutes x 3 doses as needed for chest pain. 9 tablet 1  . ondansetron (ZOFRAN) 4 MG tablet Take 1 tablet (4 mg total) by mouth 2 (two) times daily as needed. For nausea (Patient taking differently: Take 4 mg by mouth 2 (two) times daily. For nausea) 20 tablet 0  . OXYGEN Inhale 2 L into the lungs at bedtime.     . pantoprazole (PROTONIX) 40 MG tablet Take 1 tablet (40 mg total) by mouth 2 (two) times daily. 180 tablet 0  . potassium chloride SA (K-DUR,KLOR-CON) 20 MEQ tablet Take 1 tablet (20 mEq total) by mouth 2 (two) times daily. 60 tablet 1  . RANEXA 1000 MG SR tablet Take 1 tablet (1,000 mg total) by mouth 2 (two) times daily. 180 tablet 3  . rosuvastatin (CRESTOR) 40 MG tablet TAKE ONE (1) TABLET  EACH DAY (Patient taking differently: Take 40 mg by mouth at bedtime. ) 90 tablet 6  . torsemide (DEMADEX) 20 MG tablet Take 4 tablets (80 mg total) by mouth 2 (two) times daily. 240 tablet 3  . triamcinolone cream (KENALOG) 0.1 % Apply 1 application topically 2 (two) times daily.     . vitamin B-12 (CYANOCOBALAMIN) 1000 MCG tablet Take 1,000 mcg by mouth 3 (three) times a week.     . zinc gluconate 50 MG tablet Take 50 mg by mouth 3 (three) times a week.     . zolpidem (AMBIEN) 10 MG tablet Take 10 mg by mouth at bedtime.      No current facility-administered medications for this encounter.     BP (!) 144/88   Pulse (!) 50   Wt (!) 142.2 kg (313 lb 9.6 oz)   SpO2 90%   BMI 53.83 kg/m   Wt Readings from Last 3 Encounters:  01/03/18 (!) 142.2 kg (313 lb 9.6 oz)  11/22/17 132.7 kg (292 lb 9.6 oz)  11/03/17 132.5 kg (292 lb)    PHYSICAL EXAM General:   No resp difficulty HEENT: normal Neck: supple. no JVD. Carotids 2+ bilat; no bruits. No lymphadenopathy or thryomegaly appreciated. Cor: PMI nondisplaced. Regular rate & rhythm. No rubs, gallops or murmurs. RUSB 2/6  Lungs: clear Abdomen: obses, soft, nontender, nondistended. No hepatosplenomegaly. No bruits or masses. Good bowel sounds. Extremities: no cyanosis, clubbing, rash, R and LLE 1+ edema Neuro: alert & orientedx3, cranial nerves grossly intact. moves all 4 extremities w/o difficulty. Affect pleasant  EKG: Sinus Brady 46 bpm with occasional PVC  Assessment/Plan: 1. CAD: Occluded native RCA and  SVG-RCA.  Now status post opening of CTO RCA with 4 overlapping Xience DES in 5/15.  DES to sequential SVG-OM1/PLOM in 9/19.   - No s/s ischemia .  - She will need to continue Plavix and eliquis   - Continue Plavix hopefully for a year (10/2018)   - She has not tolerated Imdur in the past. Continue ranolazine and Toprol XL.  - Cardiac Rehab starts later this week.  2. Chronic diastolic CHF:   Echo in 7/03 with EF 55-60%, moderate  diastolic dysfunction.  -NYHA III. Volume status elevated. Increase torsemide to 80 mg twice a day. Will need to check BMET next visit.  - We discussed possible cardiomems and she interested. Chest circumference 140 cm. Today she signed the application for cardiomems. I think this may helpful to manage her volume.  3. Hyperlipidemia: She remains on Zetia, Crestor and Praluent.  Lipids mildly higher than goal in 9/19, she is on maximal meds at this time.    4. Hypertension: Stable. Continue current regimen.  5. Cerebrovascular disease: She has history of CVA and had left carotid stent in 2/15. Followed by [facility].  Most recent MRA head showed severe stenosis of supraclinoid RICA and severe basilar artery stenosis.   6. Palpitations:Resolved. Continue Toprol XL.  Event monitor in 2/18 with few PVCs.  7. OHS/OSA: Cannot tolerate CPAP.  Uses oxygen with exertion and at night.  8. Atrial fibrillation: Paroxysmal, symptomatic.  Today she is in sinus brady. Cut back amiodarone to 100 mg daily.   - Continue apixaban 5 mg bid.  -Recent TSH, CMET stable. She will need yearly eye exams.  9. Obesity Body mass index is 53.83 kg/m.' Discussed portion control. Needs to lose weight.   Check BMET today. Follow up in the APP clinic in 2 weeks and  Dr [name] 8-10 weeks.  Place Cardiomems once approved.   Cambell Rickenbach NP-C  01/03/2018

## 2018-01-04 LAB — BASIC METABOLIC PANEL
Anion gap: 13 (ref 5–15)
BUN: 22 mg/dL (ref 8–23)
CO2: 24 mmol/L (ref 22–32)
Calcium: 8.9 mg/dL (ref 8.9–10.3)
Chloride: 106 mmol/L (ref 98–111)
Creatinine, Ser: 1.25 mg/dL — ABNORMAL HIGH (ref 0.44–1.00)
GFR calc Af Amer: 50 mL/min — ABNORMAL LOW (ref >60)
GFR calc non Af Amer: 44 mL/min — ABNORMAL LOW (ref >60)
Glucose, Bld: 136 mg/dL — ABNORMAL HIGH (ref 70–99)
Potassium: 4.6 mmol/L (ref 3.5–5.1)
Sodium: 143 mmol/L (ref 135–145)

## 2018-01-04 NOTE — Telephone Encounter (Signed)
Cardiac Rehab Medication Review by a Pharmacist  Does the patient  feel that his/her medications are working for him/her?  yes  Has the patient been experiencing any side effects to the medications prescribed?  Yes, endorses some nausea and dizziness   Does the patient measure his/her own blood pressure or blood glucose at home?  BP - doesn't check often, 144/88 at MD; BG - 130-170 in AM, no 200s recently   Does the patient have any problems obtaining medications due to transportation or finances?   No, takes many brand medication that are unaffordable. Currently is in catastrophic coverage until end of year. Anticipates $600-700/mo in medicines starting January   Understanding of regimen: good Understanding of indications: good Potential of compliance: good   Pharmacist comments:   Patient describes nausea throughout the day that is worse in AM when gets up. She reports it is better if she takes her medications with food. Specifically she gets nauseous after taking potassium. Patient encouraged to take medication with food and report worsening or continuance of nausea to health care provider.   Patient endorses some dizziness. She reports this may be related to low blood pressure but denies measuring BP at home. Patient counseled to check her blood pressure when she experiences episodes of dizziness and report numbers that are too low (100/60, HR 60) to health care provider.   Patient expressed concerns with medication affordability. Consider referral to Health Department for [employer] Patient Assistance Program. Also consider changing medications to what is preferred under insurance plan if possible.     [name] [name], [place] D PGY1 Pharmacy Resident  01/04/2018      9:03 AM

## 2018-01-05 ENCOUNTER — Inpatient Hospital Stay (HOSPITAL_COMMUNITY)
Admission: RE | Admit: 2018-01-05 | Discharge: 2018-01-05 | Disposition: A | Discharge status: Home / Self Care | Payer: Medicare Other | Source: Ambulatory Visit

## 2018-01-05 ENCOUNTER — Telehealth (HOSPITAL_COMMUNITY): Payer: Self-pay | Admitting: Family Medicine

## 2018-01-05 ENCOUNTER — Telehealth: Payer: Self-pay

## 2018-01-05 NOTE — Telephone Encounter (Signed)
Left msg stating the pt needs to call [address] office back and let the lipid clicnic know if there will be any insurance changes within the next year and if not still let us know so that we can send out the application with highlighted sections of what to fill out and that the office would fill out the rest

## 2018-01-06 ENCOUNTER — Telehealth (HOSPITAL_COMMUNITY): Payer: Self-pay

## 2018-01-06 NOTE — Telephone Encounter (Signed)
Called and spoke to pt to reschedule missed CR orientation, patient will come in for orientation on 02/23/2018 @ 130PM and will attend the 245PM exercise class.

## 2018-01-10 ENCOUNTER — Encounter (HOSPITAL_COMMUNITY): Payer: Self-pay | Admitting: *Deleted

## 2018-01-11 ENCOUNTER — Ambulatory Visit (HOSPITAL_COMMUNITY): Payer: Medicare Other

## 2018-01-13 ENCOUNTER — Ambulatory Visit (HOSPITAL_COMMUNITY): Payer: Medicare Other

## 2018-01-16 ENCOUNTER — Inpatient Hospital Stay (HOSPITAL_COMMUNITY)
Admission: RE | Admit: 2018-01-16 | Discharge: 2018-01-19 | DRG: 264 | Disposition: A | Discharge status: Home / Self Care | Payer: Medicare Other | Attending: Cardiology | Admitting: Cardiology

## 2018-01-16 ENCOUNTER — Encounter (HOSPITAL_COMMUNITY)
Admission: RE | Disposition: A | Discharge status: Home / Self Care | Payer: Self-pay | Source: Home / Self Care | Attending: Cardiology

## 2018-01-16 ENCOUNTER — Other Ambulatory Visit: Payer: Self-pay

## 2018-01-16 ENCOUNTER — Ambulatory Visit (HOSPITAL_COMMUNITY): Payer: Medicare Other

## 2018-01-16 ENCOUNTER — Encounter (HOSPITAL_COMMUNITY): Payer: Self-pay | Admitting: General Practice

## 2018-01-16 DIAGNOSIS — Z7982 Long term (current) use of aspirin: Secondary | ICD-10-CM

## 2018-01-16 DIAGNOSIS — Z881 Allergy status to other antibiotic agents status: Secondary | ICD-10-CM

## 2018-01-16 DIAGNOSIS — Z794 Long term (current) use of insulin: Secondary | ICD-10-CM

## 2018-01-16 DIAGNOSIS — I25708 Atherosclerosis of coronary artery bypass graft(s), unspecified, with other forms of angina pectoris: Secondary | ICD-10-CM | POA: Diagnosis present

## 2018-01-16 DIAGNOSIS — Z7902 Long term (current) use of antithrombotics/antiplatelets: Secondary | ICD-10-CM | POA: Diagnosis not present

## 2018-01-16 DIAGNOSIS — Z7901 Long term (current) use of anticoagulants: Secondary | ICD-10-CM | POA: Diagnosis not present

## 2018-01-16 DIAGNOSIS — I5033 Acute on chronic diastolic (congestive) heart failure: Secondary | ICD-10-CM | POA: Diagnosis present

## 2018-01-16 DIAGNOSIS — I10 Essential (primary) hypertension: Secondary | ICD-10-CM | POA: Diagnosis present

## 2018-01-16 DIAGNOSIS — E1122 Type 2 diabetes mellitus with diabetic chronic kidney disease: Secondary | ICD-10-CM | POA: Diagnosis present

## 2018-01-16 DIAGNOSIS — E1143 Type 2 diabetes mellitus with diabetic autonomic (poly)neuropathy: Secondary | ICD-10-CM | POA: Diagnosis present

## 2018-01-16 DIAGNOSIS — I5022 Chronic systolic (congestive) heart failure: Secondary | ICD-10-CM | POA: Diagnosis present

## 2018-01-16 DIAGNOSIS — I080 Rheumatic disorders of both mitral and aortic valves: Secondary | ICD-10-CM | POA: Diagnosis present

## 2018-01-16 DIAGNOSIS — G4733 Obstructive sleep apnea (adult) (pediatric): Secondary | ICD-10-CM | POA: Diagnosis present

## 2018-01-16 DIAGNOSIS — Z955 Presence of coronary angioplasty implant and graft: Secondary | ICD-10-CM | POA: Diagnosis not present

## 2018-01-16 DIAGNOSIS — K3184 Gastroparesis: Secondary | ICD-10-CM | POA: Diagnosis present

## 2018-01-16 DIAGNOSIS — I651 Occlusion and stenosis of basilar artery: Secondary | ICD-10-CM | POA: Diagnosis present

## 2018-01-16 DIAGNOSIS — R0602 Shortness of breath: Secondary | ICD-10-CM | POA: Diagnosis present

## 2018-01-16 DIAGNOSIS — I5043 Acute on chronic combined systolic (congestive) and diastolic (congestive) heart failure: Secondary | ICD-10-CM | POA: Diagnosis not present

## 2018-01-16 DIAGNOSIS — I679 Cerebrovascular disease, unspecified: Secondary | ICD-10-CM | POA: Diagnosis present

## 2018-01-16 DIAGNOSIS — I25810 Atherosclerosis of coronary artery bypass graft(s) without angina pectoris: Secondary | ICD-10-CM | POA: Diagnosis present

## 2018-01-16 DIAGNOSIS — K219 Gastro-esophageal reflux disease without esophagitis: Secondary | ICD-10-CM | POA: Diagnosis present

## 2018-01-16 DIAGNOSIS — I340 Nonrheumatic mitral (valve) insufficiency: Secondary | ICD-10-CM | POA: Diagnosis present

## 2018-01-16 DIAGNOSIS — Z7951 Long term (current) use of inhaled steroids: Secondary | ICD-10-CM | POA: Diagnosis not present

## 2018-01-16 DIAGNOSIS — Z8673 Personal history of transient ischemic attack (TIA), and cerebral infarction without residual deficits: Secondary | ICD-10-CM | POA: Diagnosis not present

## 2018-01-16 DIAGNOSIS — G8929 Other chronic pain: Secondary | ICD-10-CM | POA: Diagnosis present

## 2018-01-16 DIAGNOSIS — Z9049 Acquired absence of other specified parts of digestive tract: Secondary | ICD-10-CM

## 2018-01-16 DIAGNOSIS — Z90710 Acquired absence of both cervix and uterus: Secondary | ICD-10-CM

## 2018-01-16 DIAGNOSIS — Z8349 Family history of other endocrine, nutritional and metabolic diseases: Secondary | ICD-10-CM

## 2018-01-16 DIAGNOSIS — Z91048 Other nonmedicinal substance allergy status: Secondary | ICD-10-CM

## 2018-01-16 DIAGNOSIS — Z8249 Family history of ischemic heart disease and other diseases of the circulatory system: Secondary | ICD-10-CM

## 2018-01-16 DIAGNOSIS — I509 Heart failure, unspecified: Secondary | ICD-10-CM | POA: Diagnosis not present

## 2018-01-16 DIAGNOSIS — Z6841 Body Mass Index (BMI) 40.0 and over, adult: Secondary | ICD-10-CM | POA: Diagnosis not present

## 2018-01-16 DIAGNOSIS — I480 Paroxysmal atrial fibrillation: Secondary | ICD-10-CM | POA: Diagnosis present

## 2018-01-16 DIAGNOSIS — Z888 Allergy status to other drugs, medicaments and biological substances status: Secondary | ICD-10-CM

## 2018-01-16 DIAGNOSIS — G43009 Migraine without aura, not intractable, without status migrainosus: Secondary | ICD-10-CM | POA: Diagnosis present

## 2018-01-16 DIAGNOSIS — Z85820 Personal history of malignant melanoma of skin: Secondary | ICD-10-CM

## 2018-01-16 DIAGNOSIS — M545 Low back pain, unspecified: Secondary | ICD-10-CM | POA: Diagnosis present

## 2018-01-16 DIAGNOSIS — I2510 Atherosclerotic heart disease of native coronary artery without angina pectoris: Secondary | ICD-10-CM | POA: Diagnosis present

## 2018-01-16 DIAGNOSIS — I130 Hypertensive heart and chronic kidney disease with heart failure and stage 1 through stage 4 chronic kidney disease, or unspecified chronic kidney disease: Secondary | ICD-10-CM | POA: Diagnosis present

## 2018-01-16 DIAGNOSIS — Z833 Family history of diabetes mellitus: Secondary | ICD-10-CM

## 2018-01-16 DIAGNOSIS — K449 Diaphragmatic hernia without obstruction or gangrene: Secondary | ICD-10-CM | POA: Diagnosis present

## 2018-01-16 DIAGNOSIS — Z9884 Bariatric surgery status: Secondary | ICD-10-CM

## 2018-01-16 DIAGNOSIS — E662 Morbid (severe) obesity with alveolar hypoventilation: Secondary | ICD-10-CM | POA: Diagnosis present

## 2018-01-16 DIAGNOSIS — E785 Hyperlipidemia, unspecified: Secondary | ICD-10-CM | POA: Diagnosis present

## 2018-01-16 DIAGNOSIS — F419 Anxiety disorder, unspecified: Secondary | ICD-10-CM | POA: Diagnosis present

## 2018-01-16 DIAGNOSIS — I5042 Chronic combined systolic (congestive) and diastolic (congestive) heart failure: Secondary | ICD-10-CM

## 2018-01-16 DIAGNOSIS — Z880 Allergy status to penicillin: Secondary | ICD-10-CM

## 2018-01-16 DIAGNOSIS — Z8041 Family history of malignant neoplasm of ovary: Secondary | ICD-10-CM

## 2018-01-16 DIAGNOSIS — I447 Left bundle-branch block, unspecified: Secondary | ICD-10-CM | POA: Diagnosis present

## 2018-01-16 HISTORY — DX: Depression, unspecified: F32.A

## 2018-01-16 HISTORY — DX: Basal cell carcinoma of skin, unspecified: C44.91

## 2018-01-16 HISTORY — DX: Dependence on supplemental oxygen: Z99.81

## 2018-01-16 HISTORY — DX: Malignant melanoma of skin, unspecified: C43.9

## 2018-01-16 HISTORY — DX: Major depressive disorder, single episode, unspecified: F32.9

## 2018-01-16 LAB — COMPREHENSIVE METABOLIC PANEL
ALT: 23 U/L (ref 0–44)
AST: 23 U/L (ref 15–41)
Albumin: 3.6 g/dL (ref 3.5–5.0)
Alkaline Phosphatase: 61 U/L (ref 38–126)
Anion gap: 8 (ref 5–15)
BUN: 17 mg/dL (ref 8–23)
CO2: 29 mmol/L (ref 22–32)
Calcium: 9.1 mg/dL (ref 8.9–10.3)
Chloride: 107 mmol/L (ref 98–111)
Creatinine, Ser: 1.31 mg/dL — ABNORMAL HIGH (ref 0.44–1.00)
GFR calc Af Amer: 48 mL/min — ABNORMAL LOW (ref >60)
GFR calc non Af Amer: 41 mL/min — ABNORMAL LOW (ref >60)
Glucose, Bld: 142 mg/dL — ABNORMAL HIGH (ref 70–99)
Potassium: 4.5 mmol/L (ref 3.5–5.1)
Sodium: 144 mmol/L (ref 135–145)
Total Bilirubin: 1.8 mg/dL — ABNORMAL HIGH (ref 0.3–1.2)
Total Protein: 6.6 g/dL (ref 6.5–8.1)

## 2018-01-16 LAB — CBC
HCT: 41.9 % (ref 36.0–46.0)
Hemoglobin: 12.5 g/dL (ref 12.0–15.0)
MCH: 30.6 pg (ref 26.0–34.0)
MCHC: 29.8 g/dL — ABNORMAL LOW (ref 30.0–36.0)
MCV: 102.7 fL — ABNORMAL HIGH (ref 80.0–100.0)
Platelets: 168 10*3/uL (ref 150–400)
RBC: 4.08 MIL/uL (ref 3.87–5.11)
RDW: 13.8 % (ref 11.5–15.5)
WBC: 10.4 10*3/uL (ref 4.0–10.5)
nRBC: 0 % (ref 0.0–0.2)

## 2018-01-16 LAB — CBC WITH DIFFERENTIAL/PLATELET
Abs Immature Granulocytes: 0.02 10*3/uL (ref 0.00–0.07)
Basophils Absolute: 0.1 10*3/uL (ref 0.0–0.1)
Basophils Relative: 1 %
Eosinophils Absolute: 0.5 10*3/uL (ref 0.0–0.5)
Eosinophils Relative: 6 %
HCT: 41.5 % (ref 36.0–46.0)
Hemoglobin: 12.8 g/dL (ref 12.0–15.0)
Immature Granulocytes: 0 %
Lymphocytes Relative: 22 %
Lymphs Abs: 1.8 10*3/uL (ref 0.7–4.0)
MCH: 31.3 pg (ref 26.0–34.0)
MCHC: 30.8 g/dL (ref 30.0–36.0)
MCV: 101.5 fL — ABNORMAL HIGH (ref 80.0–100.0)
Monocytes Absolute: 0.7 10*3/uL (ref 0.1–1.0)
Monocytes Relative: 9 %
Neutro Abs: 5.3 10*3/uL (ref 1.7–7.7)
Neutrophils Relative %: 62 %
Platelets: 160 10*3/uL (ref 150–400)
RBC: 4.09 MIL/uL (ref 3.87–5.11)
RDW: 14 % (ref 11.5–15.5)
WBC: 8.4 10*3/uL (ref 4.0–10.5)
nRBC: 0 % (ref 0.0–0.2)

## 2018-01-16 LAB — GLUCOSE, CAPILLARY
Glucose-Capillary: 116 mg/dL — ABNORMAL HIGH (ref 70–99)
Glucose-Capillary: 92 mg/dL (ref 70–99)
Glucose-Capillary: 88 mg/dL (ref 70–99)
Glucose-Capillary: 169 mg/dL — ABNORMAL HIGH (ref 70–99)

## 2018-01-16 LAB — MAGNESIUM: Magnesium: 2.2 mg/dL (ref 1.7–2.4)

## 2018-01-16 LAB — BRAIN NATRIURETIC PEPTIDE: B Natriuretic Peptide: 701.3 pg/mL — ABNORMAL HIGH (ref 0.0–100.0)

## 2018-01-16 LAB — TSH: TSH: 2.977 u[IU]/mL (ref 0.350–4.500)

## 2018-01-16 SURGERY — PRESSURE SENSOR/CARDIOMEMS
Anesthesia: LOCAL

## 2018-01-16 MED ORDER — ALPRAZOLAM 0.5 MG PO TABS: 0.5000 mg | ORAL_TABLET | Freq: Three times a day (TID) | ORAL | Status: DC | PRN

## 2018-01-16 MED ORDER — ACETAMINOPHEN 325 MG PO TABS
650.0000 mg | ORAL_TABLET | ORAL | Status: DC | PRN
  Administered 2018-01-16: 650 mg via ORAL
  Filled 2018-01-16: qty 2

## 2018-01-16 MED ORDER — LOSARTAN POTASSIUM 50 MG PO TABS
100.0000 mg | ORAL_TABLET | Freq: Every day | ORAL | Status: DC
  Administered 2018-01-17 – 2018-01-19 (×3): 100 mg via ORAL
  Filled 2018-01-16 (×3): qty 2

## 2018-01-16 MED ORDER — FENTANYL CITRATE (PF) 100 MCG/2ML IJ SOLN
INTRAMUSCULAR | Status: DC | PRN
  Administered 2018-01-16: 12.5 ug via INTRAVENOUS

## 2018-01-16 MED ORDER — MAGNESIUM OXIDE 400 (241.3 MG) MG PO TABS
400.0000 mg | ORAL_TABLET | Freq: Two times a day (BID) | ORAL | Status: DC
  Administered 2018-01-16 – 2018-01-19 (×6): 400 mg via ORAL
  Filled 2018-01-16 (×6): qty 1

## 2018-01-16 MED ORDER — ONDANSETRON HCL 4 MG/2ML IJ SOLN: 4.0000 mg | Freq: Four times a day (QID) | INTRAMUSCULAR | Status: DC | PRN

## 2018-01-16 MED ORDER — LIDOCAINE HCL (PF) 1 % IJ SOLN
INTRAMUSCULAR | Status: DC | PRN
  Administered 2018-01-16: 15 mL

## 2018-01-16 MED ORDER — VITAMIN B-12 1000 MCG PO TABS
1000.0000 ug | ORAL_TABLET | Freq: Every day | ORAL | Status: DC
  Administered 2018-01-17 – 2018-01-19 (×3): 1000 ug via ORAL
  Filled 2018-01-16 (×3): qty 1

## 2018-01-16 MED ORDER — HYDROCODONE-ACETAMINOPHEN 5-325 MG PO TABS
1.0000 | ORAL_TABLET | Freq: Every day | ORAL | Status: DC
  Administered 2018-01-16 – 2018-01-18 (×3): 1 via ORAL
  Filled 2018-01-16 (×3): qty 1

## 2018-01-16 MED ORDER — FUROSEMIDE 10 MG/ML IJ SOLN
80.0000 mg | Freq: Two times a day (BID) | INTRAMUSCULAR | Status: DC
  Administered 2018-01-16 – 2018-01-17 (×3): 80 mg via INTRAVENOUS
  Filled 2018-01-16 (×3): qty 8

## 2018-01-16 MED ORDER — GABAPENTIN 600 MG PO TABS: 600.0000 mg | ORAL_TABLET | ORAL | Status: DC

## 2018-01-16 MED ORDER — ASPIRIN EC 81 MG PO TBEC
81.0000 mg | DELAYED_RELEASE_TABLET | Freq: Every day | ORAL | Status: DC
  Administered 2018-01-17: 81 mg via ORAL
  Filled 2018-01-16: qty 1

## 2018-01-16 MED ORDER — ONDANSETRON HCL 4 MG PO TABS: 4.0000 mg | ORAL_TABLET | Freq: Two times a day (BID) | ORAL | Status: DC | PRN

## 2018-01-16 MED ORDER — METHOCARBAMOL 500 MG PO TABS
500.0000 mg | ORAL_TABLET | Freq: Three times a day (TID) | ORAL | Status: DC
  Administered 2018-01-16 – 2018-01-19 (×8): 500 mg via ORAL
  Filled 2018-01-16 (×8): qty 1

## 2018-01-16 MED ORDER — IPRATROPIUM-ALBUTEROL 0.5-2.5 (3) MG/3ML IN SOLN: 3.0000 mL | RESPIRATORY_TRACT | Status: DC | PRN

## 2018-01-16 MED ORDER — DULOXETINE HCL 30 MG PO CPEP
30.0000 mg | ORAL_CAPSULE | Freq: Every day | ORAL | Status: DC
  Administered 2018-01-17 – 2018-01-19 (×3): 30 mg via ORAL
  Filled 2018-01-16 (×3): qty 1

## 2018-01-16 MED ORDER — RANOLAZINE ER 500 MG PO TB12
1000.0000 mg | ORAL_TABLET | Freq: Two times a day (BID) | ORAL | Status: DC
  Administered 2018-01-16 – 2018-01-19 (×6): 1000 mg via ORAL
  Filled 2018-01-16 (×6): qty 2

## 2018-01-16 MED ORDER — SODIUM CHLORIDE 0.9% FLUSH: 3.0000 mL | INTRAVENOUS | Status: DC | PRN

## 2018-01-16 MED ORDER — AMIODARONE HCL 100 MG PO TABS
100.0000 mg | ORAL_TABLET | Freq: Every day | ORAL | Status: DC
  Administered 2018-01-17 – 2018-01-19 (×3): 100 mg via ORAL
  Filled 2018-01-16 (×3): qty 1

## 2018-01-16 MED ORDER — LOPERAMIDE HCL 2 MG PO CAPS: 2.0000 mg | ORAL_CAPSULE | ORAL | Status: DC | PRN

## 2018-01-16 MED ORDER — ASPIRIN 81 MG PO CHEW
81.0000 mg | CHEWABLE_TABLET | ORAL | Status: AC
  Administered 2018-01-16: 81 mg via ORAL
  Filled 2018-01-16: qty 1

## 2018-01-16 MED ORDER — B COMPLEX VITAMINS PO CAPS: 1.0000 | ORAL_CAPSULE | Freq: Every day | ORAL | Status: DC

## 2018-01-16 MED ORDER — NITROGLYCERIN 0.4 MG SL SUBL: 0.4000 mg | SUBLINGUAL_TABLET | SUBLINGUAL | Status: DC | PRN

## 2018-01-16 MED ORDER — POLYVINYL ALCOHOL-POVIDONE PF 1.4-0.6 % OP SOLN: 2.0000 [drp] | Freq: Two times a day (BID) | OPHTHALMIC | Status: DC | PRN

## 2018-01-16 MED ORDER — HEPARIN (PORCINE) IN NACL 1000-0.9 UT/500ML-% IV SOLN
INTRAVENOUS | Status: AC
  Filled 2018-01-16: qty 1000

## 2018-01-16 MED ORDER — ZINC GLUCONATE 50 MG PO TABS: 50.0000 mg | ORAL_TABLET | Freq: Every day | ORAL | Status: DC

## 2018-01-16 MED ORDER — SODIUM CHLORIDE 0.9 % IV SOLN: 250.0000 mL | INTRAVENOUS | Status: DC | PRN

## 2018-01-16 MED ORDER — LIDOCAINE HCL (PF) 1 % IJ SOLN
INTRAMUSCULAR | Status: AC
  Filled 2018-01-16: qty 30

## 2018-01-16 MED ORDER — ACETAMINOPHEN 325 MG PO TABS: 650.0000 mg | ORAL_TABLET | ORAL | Status: DC | PRN

## 2018-01-16 MED ORDER — SODIUM CHLORIDE 0.9% FLUSH
3.0000 mL | Freq: Two times a day (BID) | INTRAVENOUS | Status: DC
  Administered 2018-01-16 – 2018-01-19 (×5): 3 mL via INTRAVENOUS

## 2018-01-16 MED ORDER — ADULT MULTIVITAMIN W/MINERALS CH
1.0000 | ORAL_TABLET | Freq: Every day | ORAL | Status: DC
  Administered 2018-01-17 – 2018-01-19 (×3): 1 via ORAL
  Filled 2018-01-16 (×3): qty 1

## 2018-01-16 MED ORDER — METOPROLOL SUCCINATE ER 25 MG PO TB24
25.0000 mg | ORAL_TABLET | Freq: Two times a day (BID) | ORAL | Status: DC
  Administered 2018-01-17 (×2): 25 mg via ORAL
  Filled 2018-01-16 (×4): qty 1

## 2018-01-16 MED ORDER — POTASSIUM CHLORIDE CRYS ER 20 MEQ PO TBCR
20.0000 meq | EXTENDED_RELEASE_TABLET | Freq: Two times a day (BID) | ORAL | Status: DC
  Administered 2018-01-17 (×2): 20 meq via ORAL
  Filled 2018-01-16 (×2): qty 1

## 2018-01-16 MED ORDER — MIDAZOLAM HCL 2 MG/2ML IJ SOLN
INTRAMUSCULAR | Status: AC
  Filled 2018-01-16: qty 2

## 2018-01-16 MED ORDER — ALBUTEROL SULFATE (2.5 MG/3ML) 0.083% IN NEBU: 2.5000 mg | INHALATION_SOLUTION | RESPIRATORY_TRACT | Status: DC | PRN

## 2018-01-16 MED ORDER — MECLIZINE HCL 25 MG PO TABS: 12.5000 mg | ORAL_TABLET | Freq: Three times a day (TID) | ORAL | Status: DC | PRN

## 2018-01-16 MED ORDER — GABAPENTIN 300 MG PO CAPS
600.0000 mg | ORAL_CAPSULE | Freq: Two times a day (BID) | ORAL | Status: DC
  Administered 2018-01-17 – 2018-01-19 (×5): 600 mg via ORAL
  Filled 2018-01-16 (×5): qty 2

## 2018-01-16 MED ORDER — ALBUTEROL 90 MCG/ACT IN AERS: 2.0000 | INHALATION_SPRAY | RESPIRATORY_TRACT | Status: DC | PRN

## 2018-01-16 MED ORDER — ZOLPIDEM TARTRATE 5 MG PO TABS
5.0000 mg | ORAL_TABLET | Freq: Every day | ORAL | Status: DC
  Administered 2018-01-16 – 2018-01-18 (×3): 5 mg via ORAL
  Filled 2018-01-16 (×3): qty 1

## 2018-01-16 MED ORDER — BUPROPION HCL ER (XL) 150 MG PO TB24
150.0000 mg | ORAL_TABLET | Freq: Every day | ORAL | Status: DC
  Administered 2018-01-17 – 2018-01-19 (×3): 150 mg via ORAL
  Filled 2018-01-16 (×3): qty 1

## 2018-01-16 MED ORDER — B COMPLEX-C PO TABS
1.0000 | ORAL_TABLET | Freq: Every day | ORAL | Status: DC
  Administered 2018-01-17 – 2018-01-19 (×3): 1 via ORAL
  Filled 2018-01-16 (×4): qty 1

## 2018-01-16 MED ORDER — IOHEXOL 350 MG/ML SOLN
INTRAVENOUS | Status: DC | PRN
  Administered 2018-01-16: 10 mL via INTRAVENOUS

## 2018-01-16 MED ORDER — FENTANYL CITRATE (PF) 100 MCG/2ML IJ SOLN
INTRAMUSCULAR | Status: AC
  Filled 2018-01-16: qty 2

## 2018-01-16 MED ORDER — ROSUVASTATIN CALCIUM 20 MG PO TABS
40.0000 mg | ORAL_TABLET | Freq: Every day | ORAL | Status: DC
  Administered 2018-01-16 – 2018-01-18 (×3): 40 mg via ORAL
  Filled 2018-01-16 (×3): qty 2

## 2018-01-16 MED ORDER — SODIUM CHLORIDE 0.9% FLUSH
3.0000 mL | Freq: Two times a day (BID) | INTRAVENOUS | Status: DC
  Administered 2018-01-16: 3 mL via INTRAVENOUS

## 2018-01-16 MED ORDER — APIXABAN 5 MG PO TABS
5.0000 mg | ORAL_TABLET | Freq: Two times a day (BID) | ORAL | Status: DC
  Administered 2018-01-17 – 2018-01-19 (×5): 5 mg via ORAL
  Filled 2018-01-16 (×5): qty 1

## 2018-01-16 MED ORDER — AMLODIPINE BESYLATE 10 MG PO TABS
10.0000 mg | ORAL_TABLET | Freq: Every day | ORAL | Status: DC
  Administered 2018-01-16 – 2018-01-18 (×3): 10 mg via ORAL
  Filled 2018-01-16 (×3): qty 1

## 2018-01-16 MED ORDER — ACETAMINOPHEN 500 MG PO TABS: 1000.0000 mg | ORAL_TABLET | Freq: Four times a day (QID) | ORAL | Status: DC | PRN

## 2018-01-16 MED ORDER — SODIUM CHLORIDE 0.9% FLUSH
3.0000 mL | Freq: Two times a day (BID) | INTRAVENOUS | Status: DC
  Administered 2018-01-17 – 2018-01-19 (×3): 3 mL via INTRAVENOUS

## 2018-01-16 MED ORDER — VITAMIN D 25 MCG (1000 UNIT) PO TABS: 2000.0000 [IU] | ORAL_TABLET | Freq: Every day | ORAL | Status: DC

## 2018-01-16 MED ORDER — EZETIMIBE 10 MG PO TABS
10.0000 mg | ORAL_TABLET | Freq: Every day | ORAL | Status: DC
  Administered 2018-01-16 – 2018-01-18 (×3): 10 mg via ORAL
  Filled 2018-01-16 (×3): qty 1

## 2018-01-16 MED ORDER — TRIAMCINOLONE ACETONIDE 0.1 % EX CREA: 1.0000 "application " | TOPICAL_CREAM | Freq: Two times a day (BID) | CUTANEOUS | Status: DC

## 2018-01-16 MED ORDER — PROSIGHT PO TABS
1.0000 | ORAL_TABLET | Freq: Every day | ORAL | Status: DC
  Administered 2018-01-17 – 2018-01-19 (×3): 1 via ORAL
  Filled 2018-01-16 (×3): qty 1

## 2018-01-16 MED ORDER — CLOPIDOGREL BISULFATE 75 MG PO TABS
75.0000 mg | ORAL_TABLET | Freq: Every day | ORAL | Status: DC
  Administered 2018-01-16 – 2018-01-18 (×3): 75 mg via ORAL
  Filled 2018-01-16 (×3): qty 1

## 2018-01-16 MED ORDER — INSULIN GLARGINE 100 UNIT/ML ~~LOC~~ SOLN
50.0000 [IU] | Freq: Every day | SUBCUTANEOUS | Status: DC
  Administered 2018-01-16 – 2018-01-18 (×3): 50 [IU] via SUBCUTANEOUS
  Filled 2018-01-16 (×3): qty 0.5

## 2018-01-16 MED ORDER — POLYVINYL ALCOHOL 1.4 % OP SOLN
1.0000 [drp] | OPHTHALMIC | Status: DC | PRN
  Filled 2018-01-16: qty 15

## 2018-01-16 MED ORDER — PANTOPRAZOLE SODIUM 40 MG PO TBEC
40.0000 mg | DELAYED_RELEASE_TABLET | Freq: Two times a day (BID) | ORAL | Status: DC
  Administered 2018-01-16 – 2018-01-19 (×6): 40 mg via ORAL
  Filled 2018-01-16 (×6): qty 1

## 2018-01-16 MED ORDER — ENOXAPARIN SODIUM 40 MG/0.4ML ~~LOC~~ SOLN: 40.0000 mg | SUBCUTANEOUS | Status: DC

## 2018-01-16 MED ORDER — MULTIVITAMINS PO CAPS: 1.0000 | ORAL_CAPSULE | Freq: Every day | ORAL | Status: DC

## 2018-01-16 MED ORDER — SODIUM CHLORIDE 0.9 % IV SOLN
INTRAVENOUS | Status: DC
  Administered 2018-01-16: 12:00:00 via INTRAVENOUS

## 2018-01-16 MED ORDER — GABAPENTIN 400 MG PO CAPS
1200.0000 mg | ORAL_CAPSULE | Freq: Every day | ORAL | Status: DC
  Administered 2018-01-16 – 2018-01-18 (×3): 1200 mg via ORAL
  Filled 2018-01-16 (×3): qty 3

## 2018-01-16 MED ORDER — MOMETASONE FURO-FORMOTEROL FUM 200-5 MCG/ACT IN AERO
2.0000 | INHALATION_SPRAY | Freq: Two times a day (BID) | RESPIRATORY_TRACT | Status: DC
  Administered 2018-01-17 – 2018-01-19 (×5): 2 via RESPIRATORY_TRACT
  Filled 2018-01-16: qty 8.8

## 2018-01-16 MED ORDER — CLONIDINE HCL 0.2 MG PO TABS
0.2000 mg | ORAL_TABLET | Freq: Two times a day (BID) | ORAL | Status: DC
  Administered 2018-01-16 – 2018-01-19 (×6): 0.2 mg via ORAL
  Filled 2018-01-16 (×6): qty 1

## 2018-01-16 MED ORDER — CALCIUM CARBONATE 1250 (500 CA) MG PO TABS: 1250.0000 mg | ORAL_TABLET | Freq: Every day | ORAL | Status: DC

## 2018-01-16 MED ORDER — ZINC SULFATE 220 (50 ZN) MG PO CAPS
220.0000 mg | ORAL_CAPSULE | Freq: Every day | ORAL | Status: DC
  Administered 2018-01-17 – 2018-01-19 (×3): 220 mg via ORAL
  Filled 2018-01-16 (×3): qty 1

## 2018-01-16 MED ORDER — HEPARIN (PORCINE) IN NACL 1000-0.9 UT/500ML-% IV SOLN
INTRAVENOUS | Status: DC | PRN
  Administered 2018-01-16 (×2): 500 mL

## 2018-01-16 MED ORDER — MIDAZOLAM HCL 2 MG/2ML IJ SOLN
INTRAMUSCULAR | Status: DC | PRN
  Administered 2018-01-16: 0.5 mg via INTRAVENOUS

## 2018-01-16 SURGICAL SUPPLY — 14 items
CARDIOMEMS PA SENSOR W/DELIVER (Prosthesis & Implant Heart) ×2 IMPLANT
CATH SWAN GANZ 7F STRAIGHT (CATHETERS) ×2 IMPLANT
KIT HEART LEFT (KITS) ×2 IMPLANT
KIT PV (KITS) ×2 IMPLANT
PACK CARDIAC CATHETERIZATION (CUSTOM PROCEDURE TRAY) ×2 IMPLANT
SENSOR CARDIOMEMS PA W/DELIVER (Prosthesis & Implant Heart) ×1 IMPLANT
SHEATH FAST CATH 12F 12CM (SHEATH) ×2 IMPLANT
SHEATH PINNACLE 7F 10CM (SHEATH) ×2 IMPLANT
SHEATH PINNACLE 9F 10CM (SHEATH) ×2 IMPLANT
SHEATH PROBE COVER 6X72 (BAG) ×2 IMPLANT
TRANSDUCER W/STOPCOCK (MISCELLANEOUS) ×2 IMPLANT
WIRE EMERALD 3MM-J .025X260CM (WIRE) ×2 IMPLANT
WIRE EMERALD 3MM-J .035X150CM (WIRE) ×2 IMPLANT
WIRE G V18X300CM (WIRE) ×2 IMPLANT

## 2018-01-16 NOTE — H&P (Signed)
Advanced Heart Failure Team History and Physical Note   PCP:  Derinda Late, MD  PCP-Cardiology: No primary care provider on file.     Reason for Admission: CHF exacerbation.   HPI:    72 y.o. with history of CAD s/p CABG, diastolic CHF, and cerebrovascular disease with history of CVA was admitted from RHC/Cardiomems procedure today due to acute on chronic diastolic CHF.  [name] [name] She had CABG x 5 in 12/11.  Prior to the CABG she had multiple PCIs.  She had a CVA in 2/14 that presented as visual loss.  She had a left carotid stent in 2/15.   Left heart cath done in Sept. 2014. This showed patent SVG-D, LIMA-LAD, and sequential SVG-OM branches but SVG-RCA and the native RCA were both totally occluded. It was felt aht her increased symptoms coincided with occlusion of SVG-RCA.  She was started on Imdur to see if this would help with the chest pain and dyspnea.  However, she feels like Imdur causes leg cramps and does not think that she can take it.  So ranolazine 500 mg bid started and titrated this up to 1000 mg bid.  This helped some but not markedly.  Therefore, sent to  Dr. Irish Lack to address opening RCA CTO. He was able to do this in 5/15 with 4 overlapping Xience DES.  This led to resolution of exertional chest pain.    Mrs Rhee was started on Brilinta after PCI.  She became much more short of breath after starting on Brilinta. Per Dr Delano Metz stopped and replaced with Plavix.  Dyspnea improved off Brilinta. Given increasing exertional dyspnea and chest pain,  RHC/LHC was done in 4/17.  This showed stable CAD with no interventional target.  Left and right heart filling pressures were not significantly increased.  Medical management.   She had an MRI/MRA in May 2018 that showed moderately severe stenosis of the supraclinoid segment of the right internal carotid artery, progressed since the 2016 MR angiogram and severe basilar artery stenosis.   She developed recurrent exertional  chest pain and had LHC in 6/18, showing 4/5 grafts patent with patent native RCA (similar to prior cath, no changes).    Echo 1/19 with EF 55-60%, moderate diastolic dysfunction, PASP 50 mmHg, mildly dilated RV, mild AS, mild-moderate MR.   She reported increased dyspnea and chest pain and had RHC/LHC in 9/19.  She had DES to sequential SVG-OM1/PLOM. While hospitalized, she was noted to have runs of atrial fibrillation.  She was very symptomatic with the atrial fibrillation.  Eliquis and amiodarone were started.   Given difficulty managing volume status and difficulty with exam, we made the decision to place Cardiomems device.  This was done successfully today.  She was noted to be volume overloaded on RHC with mean RA pressure 22 mmHg and mean PCWP 25 mmHg.  Decision was made to admit her for IV diuresis.  She has her baseline chronic dyspnea walking short distances.   Review of Systems:  All systems reviewed and negative except as per HPI.    Home Medications Prior to Admission medications   Medication Sig Start Date End Date Taking? Authorizing Provider  acetaminophen (TYLENOL) 500 MG tablet Take 1,000 mg by mouth every 6 (six) hours as needed for mild pain or headache.    Yes [provider]  albuterol (PROVENTIL,VENTOLIN) 90 MCG/ACT inhaler Inhale 2 puffs into the lungs every 4 (four) hours as needed for wheezing or shortness of breath.  Yes [provider]  Alirocumab (PRALUENT) 150 MG/ML SOPN Inject 1 pen into the skin every 14 (fourteen) days. 04/07/16  Yes Larey Dresser, MD  ALPRAZolam Duanne Moron) 0.5 MG tablet Take 0.5-1 mg by mouth See admin instructions. Take 0.5 mg by mouth in the morning. Take 0.5 mg by mouth at noon if needed for anxiety. Take 1 mg by mouth at bedtime.   Yes [provider]  amiodarone (PACERONE) 100 MG tablet Take 1 tablet (100 mg total) by mouth daily. 01/03/18  Yes Clegg, Amy D, NP  amLODipine (NORVASC) 10 MG tablet Take 10 mg by mouth  at bedtime.  01/20/15  Yes [provider]  apixaban (ELIQUIS) 5 MG TABS tablet Take 1 tablet (5 mg total) by mouth 2 (two) times daily. 12/14/17  Yes Larey Dresser, MD  aspirin EC 81 MG tablet Take 1 tablet (81 mg total) by mouth daily. 10/23/17  Yes Reino Bellis B, NP  b complex vitamins capsule Take 1 capsule by mouth daily.   Yes [provider]  buPROPion (WELLBUTRIN XL) 150 MG 24 hr tablet Take 150 mg by mouth daily.   Yes [provider]  Calcium Carbonate 1500 (600 CA) MG TABS Take 1,500 mg by mouth daily with breakfast.   Yes [provider]  Cholecalciferol (VITAMIN D) 2000 UNITS tablet Take 2,000 Units by mouth daily with breakfast.    Yes [provider]  cloNIDine (CATAPRES) 0.2 MG tablet Take 1 tablet (0.2 mg total) by mouth 2 (two) times daily. 11/03/17 11/03/18 Yes Clegg, Amy D, NP  clopidogrel (PLAVIX) 75 MG tablet TAKE ONE (1) TABLET EACH DAY Patient taking differently: Take 75 mg by mouth at bedtime.  08/08/17  Yes Bensimhon, Shaune Pascal, MD  DULoxetine (CYMBALTA) 30 MG capsule Take 1 capsule (30 mg total) by mouth 2 (two) times daily. Patient taking differently: Take 30 mg by mouth daily.  07/15/17  Yes Kathrynn Ducking, MD  ezetimibe (ZETIA) 10 MG tablet TAKE ONE (1) TABLET EACH DAY Patient taking differently: Take 10 mg by mouth at bedtime.  07/16/16  Yes Bensimhon, Shaune Pascal, MD  Fluticasone-Salmeterol (ADVAIR DISKUS) 250-50 MCG/DOSE AEPB Inhale 1 puff into the lungs 2 (two) times daily.    Yes [provider]  gabapentin (NEURONTIN) 600 MG tablet Take 600-1,200 mg by mouth See admin instructions. Take 600 mg by mouth in the morning and take 600 mg by mouth in the afternoon. Take 1200 mg by mouth at bedtime.   Yes [provider]  HUMALOG KWIKPEN 100 UNIT/ML SOPN Inject 18-26 Units into the skin 3 (three) times daily before meals. Per sliding scale 09/19/12  Yes [provider]  HYDROcodone-acetaminophen  (NORCO/VICODIN) 5-325 MG per tablet Take 1 tablet by mouth at bedtime.    Yes [provider]  Insulin Glargine (LANTUS SOLOSTAR) 100 UNIT/ML Solostar Pen Inject 50 Units into the skin at bedtime.    Yes [provider]  ipratropium-albuterol (DUONEB) 0.5-2.5 (3) MG/3ML SOLN Take 3 mLs by nebulization every 4 (four) hours as needed (for severe shortness of breath or wheezing).    Yes [provider]  loperamide (IMODIUM) 2 MG capsule Take 2 mg by mouth as needed for diarrhea or loose stools.   Yes [provider]  losartan (COZAAR) 100 MG tablet Take 100 mg by mouth daily.   Yes [provider]  magnesium oxide (MAG-OX) 400 MG tablet Take 400 mg by mouth 2 (two) times daily.   Yes  [provider]  methocarbamol (ROBAXIN) 500 MG tablet TAKE ONE (1) TABLET THREE (3) [place] Patient taking differently: Take 500 mg by mouth 3 (three) times daily.  08/24/16  Yes Kathrynn Ducking, MD  metolazone (ZAROXOLYN) 2.5 MG tablet Take 1 tablet (2.5 mg total) by mouth once a week. Take 1 Tablet once weekly on Mondays. Patient taking differently: Take 2.5 mg by mouth every Monday.  11/22/17  Yes Larey Dresser, MD  metoprolol succinate (TOPROL-XL) 25 MG 24 hr tablet Take 1 tablet (25 mg total) by mouth 2 (two) times daily. 11/22/17  Yes Larey Dresser, MD  Multiple Vitamin (MULTIVITAMIN) capsule Take 1 capsule by mouth daily.   Yes [provider]  multivitamin-lutein (OCUVITE-LUTEIN) CAPS capsule Take 1 capsule by mouth daily.   Yes [provider]  nitroGLYCERIN (NITROSTAT) 0.3 MG SL tablet Place 1 tablet (0.3 mg total) under the tongue every 5 (five) minutes x 3 doses as needed for chest pain. 09/15/17  Yes Georgiana Shore, NP  ondansetron (ZOFRAN) 4 MG tablet Take 1 tablet (4 mg total) by mouth 2 (two) times daily as needed. For nausea Patient taking differently: Take 4 mg by mouth 2 (two) times daily.  02/12/14  Yes Lafayette Dragon,  MD  OXYGEN Inhale 2 L into the lungs at bedtime.    Yes [provider]  pantoprazole (PROTONIX) 40 MG tablet Take 1 tablet (40 mg total) by mouth 2 (two) times daily. 04/26/16  Yes Irene Shipper, MD  Polyvinyl Alcohol-Povidone PF (REFRESH) 1.4-0.6 % SOLN Place 2 drops into both eyes 2 (two) times daily as needed (for dry eyes).   Yes [provider]  potassium chloride SA (K-DUR,KLOR-CON) 20 MEQ tablet Take 1 tablet (20 mEq total) by mouth 2 (two) times daily. 12/14/17  Yes Larey Dresser, MD  RANEXA 1000 MG SR tablet Take 1 tablet (1,000 mg total) by mouth 2 (two) times daily. 11/09/17  Yes Larey Dresser, MD  rosuvastatin (CRESTOR) 40 MG tablet TAKE ONE (1) TABLET EACH DAY Patient taking differently: Take 40 mg by mouth at bedtime.  12/10/16  Yes Bensimhon, Shaune Pascal, MD  torsemide (DEMADEX) 20 MG tablet Take 4 tablets (80 mg total) by mouth 2 (two) times daily. 01/03/18 02/03/18 Yes Clegg, Amy D, NP  triamcinolone cream (KENALOG) 0.1 % Apply 1 application topically 2 (two) times daily.    Yes [provider]  vitamin B-12 (CYANOCOBALAMIN) 1000 MCG tablet Take 1,000 mcg by mouth daily.   Yes [provider]  zinc gluconate 50 MG tablet Take 50 mg by mouth daily.    Yes [provider]  zolpidem (AMBIEN) 10 MG tablet Take 10 mg by mouth at bedtime.    Yes [provider]  albuterol (PROVENTIL) (2.5 MG/3ML) 0.083% nebulizer solution Take 2.5 mg by nebulization every 4 (four) hours as needed for wheezing or shortness of breath.     [provider]  denosumab (PROLIA) 60 MG/ML SOLN injection Inject 60 mg into the skin every 6 (six) months. Administer in upper arm, thigh, or abdomen    [provider]  meclizine (ANTIVERT) 12.5 MG tablet Take 12.5 mg by mouth 3 (three) times daily as needed for dizziness.    [provider]    PMH: 1. Diabetic gastroparesis 2. Type II diabetes 3. HTN 4. Morbid obesity 5. CAD: s/p CABG  in 12/11 after prior PCIs.  LIMA-LAD, SVG-D, seq SVG-OM1 and OM2, SVG-PDA. Adenosine Cardiolite (  8/14) with EF 53% and a small reversible apical defect with a medium, partially reversible inferior defect.  LHC (9/14) with patent SVG-D, LIMA-LAD (50% distal LAD), and sequential SVG-OM branches; the SVG-RCA and the native RCA were occluded.  This was managed medically initially, but with ongoing exertional chest pain, it was decided to open CTO.  Patient had CTO opening with 4 overlapping Xience DES in the RCA in 5/15.   - LHC (4/17): SVG-D patent, LIMA-LAD patent, distal LAD with several 40-50% stenoses, sequential SVG-OM1 and PLOM patent with 50% proximal stenosis (not flow limiting), SVG-RCA TO with patent RCA stents.  - LHC (6/18): 4/5 grafts patent (SVG-RCA TO, same as past), RCA stents patent => no change.  - LHC (9/19): Sequential SVG-PLOM/OM1 with 80% proximal stenosis, s/p DES.  6. Atypical migraines 7. OHS/OSA: Intolerant of CPAP. Uses oxygen with exertion and at night.  8. GERD with hiatal hernia 9. OA 10. Diastolic CHF: Echo (32/95) with EF 50-55%, grade II diastolic dysfunction, mild-moderate MR.  Echo (8/14) with EF 55-60%, grade II diastolic dysfunction, mildly increased aortic valve gradient (mean 12 mmHg) but valve opens well, mild MR and mild RV dilation.  Echo (9/15) with EF 60-65%, grade II diastolic dysfunction, mild aortic stenosis, mild mitral stenosis, mild to moderate mitral regurgitation, mildly dilated RV with normal systolic function, PA systolic pressure 46 mmHg.  - RHC (4/17): mean RA 9, PA 38/15 mean 27, mean PCWP 9, CI 2.5.  - Echo (5/17): EF 55-60%, mild LVH, mildly dilated RV with low normal systolic function, moderate TR, PASP 61 mmHg - Echo (1/19): EF 55-60%, moderate diastolic dysfunction, PASP 50 mmHg, mildly dilated RV, mild AS, mild-moderate MR. - RHC (9/19): mean RA 11, PA 34/12, mean PCWP 15, CI 2.85 11. CKD 12. Chronic LBBB 13. Anxiety 14. Carotid stenosis:  Followed by [facility], >18% LICA stenosis 84/16.  She had left carotid stent in 2/15. Carotid dopplers 8/15 with no significant disease. Carotid dopplers (4/16): < 40% RICA stenosis.  15. Cerebrovascular disease: CVA 2/14 with right posterior cerebral artery territory ischemic infarction. Cerebral angiogram in 6/14 showed 70% right vertebral ostial stenosis, 60-63% LICA stenosis, > 01% proximal left posterior cerebral artery stenosis, posterior communicating artery aneurysm.  Patient has had episodes of transient expressive aphasia.  Carotid dopplers (60/10) showed >93% LICA stenosis. She had a left carotid stent 2/15.  Possible CVA in 7/15. -  MRI/MRA in May 2018 that showed moderately severe stenosis of the supraclinoid segment of the right internal carotid artery, progressed since the 2016 MR angiogram and severe basilar artery stenosis.  16. Positional vertigo (suspected) 17. Palpitations: Holter (6/15) with rare PVCs/PACs.  - Event monitor (2/18): NSR, occasional PVCs 18. Dyspnea with Brilinta. 19. Melanoma: On face, s/p excision.   20. Aortic stenosis: Mild.  21. Holter (8/16) with no significant arrhythmias.  22. Lower extremity arterial dopplers (9/16) were normal.  23. Sleep study (4/18): No significant OSA.  24. Atrial fibrillation: Paroxysmal  Past Surgical History: Past Surgical History:  Procedure Laterality Date  . ANKLE FRACTURE SURGERY Left 1970's  . APPENDECTOMY  1970's   w/hysterectomy  . CARDIAC CATHETERIZATION  10/10/2012   Dr [name].  [name] [name] CARDIAC CATHETERIZATION N/A 05/29/2015   Procedure: Right/Left Heart Cath and Coronary/Graft Angiography;  Surgeon: Larey Dresser, MD;  Location: [place] [facility];  Service: Cardiovascular;  Laterality: N/A;  . CAROTID ENDARTERECTOMY Left 03/2013  . CAROTID STENT INSERTION Left 03/20/2013   Procedure: CAROTID STENT INSERTION;  Surgeon: Butch Penny  Trula Slade, MD;  Location: [place] [facility]ATH [facility];  Service: Cardiovascular;  Laterality: Left;  internal carotid    . CEREBRAL ANGIOGRAM N/A 04/05/2011   Procedure: CEREBRAL ANGIOGRAM;  Surgeon: Angelia Mould, MD;  Location: [facility] CATH LAB;  Service: Cardiovascular;  Laterality: N/A;  . CHOLECYSTECTOMY  2004  . CORONARY ANGIOPLASTY WITH STENT PLACEMENT  01,02,05,06,07,08,11; 04/24/2013   "I've probably got ~ 10 stents by now" (04/24/2013)  . CORONARY ANGIOPLASTY WITH STENT PLACEMENT  06/13/2013   "got 4 stents today" (06/13/2013)  . CORONARY ARTERY BYPASS GRAFT  1220/11   "CABG X5"  . CORONARY STENT INTERVENTION N/A 10/20/2017   Procedure: CORONARY STENT INTERVENTION;  Surgeon: Troy Sine, MD;  Location: [place] [facility];  Service: Cardiovascular;  Laterality: N/A;  . ESOPHAGOGASTRODUODENOSCOPY  08/03/2011   Procedure: ESOPHAGOGASTRODUODENOSCOPY (EGD);  Surgeon: Shann Medal, MD;  Location: [facility];  Service: General;  Laterality: N/A;  . ESOPHAGOGASTRODUODENOSCOPY (EGD) WITH PROPOFOL N/A 03/11/2014   Procedure: ESOPHAGOGASTRODUODENOSCOPY (EGD) WITH PROPOFOL;  Surgeon: Lafayette Dragon, MD;  Location: [facility];  Service: Endoscopy;  Laterality: N/A;  . FRACTURE SURGERY    . gall stone removal  05/2003  . GASTRIC BYPASS  1984   "stapeling"  . HERNIA REPAIR  2004   "in my stomach; had OR on it twice", wire mesh on 1 hernia  . LEFT HEART CATH AND CORS/GRAFTS ANGIOGRAPHY N/A 07/09/2016   Procedure: LEFT HEART CATH AND CORS/GRAFTS ANGIOGRAPHY;  Surgeon: Larey Dresser, MD;  Location: [place] [facility];  Service: Cardiovascular;  Laterality: N/A;  . PERCUTANEOUS CORONARY STENT INTERVENTION (PCI-S) N/A 06/13/2013   Procedure: PERCUTANEOUS CORONARY STENT INTERVENTION (PCI-S);  Surgeon: [name], MD;  Location: [facility] CATH LAB;  Service: Cardiovascular;  Laterality: N/A;  . PERCUTANEOUS STENT INTERVENTION N/A 04/24/2013   Procedure: PERCUTANEOUS STENT INTERVENTION;  Surgeon: [name], MD;  Location: [facility] CATH LAB;  Service: Cardiovascular;  Laterality: N/A;  . RIGHT/LEFT HEART CATH AND  CORONARY/GRAFT ANGIOGRAPHY N/A 10/20/2017   Procedure: RIGHT/LEFT HEART CATH AND CORONARY/GRAFT ANGIOGRAPHY;  Surgeon: Larey Dresser, MD;  Location: [place] [facility];  Service: Cardiovascular;  Laterality: N/A;  . ROOT CANAL  10/2000  . TIBIA FRACTURE SURGERY Right 1970's   rods and pins  . TOOTH EXTRACTION    . TOTAL ABDOMINAL HYSTERECTOMY  1970's   w/ appendectomy  . TUMOR REMOVAL  1970's   ovarian    Family History  Problem Relation Age of Onset  . Heart disease Father        Heart Disease before age 61  . Diabetes Father   . Hyperlipidemia Father   . Hypertension Father   . Heart attack Father   . Deep vein thrombosis Father   . AAA (abdominal aortic aneurysm) Father   . Hypertension Mother   . Deep vein thrombosis Mother   . AAA (abdominal aortic aneurysm) Mother   . Cancer Sister        Ovarian  . Hypertension Sister   . Diabetes Paternal Grandmother   . Heart disease Paternal Uncle   . Diabetes Paternal Uncle   . Heart attack Paternal Grandfather 29       died of MI at 6  . Diabetes Paternal Aunt   . Diabetes Paternal Uncle   . Colon cancer Neg Hx     Social History: Social History   Socioeconomic History  . Marital status: Married    Spouse name: [name]   . Number of children: 1  .  Years of education: 12+  . Highest education level: Not on file  Occupational History  . Occupation: retired  Scientific laboratory technician  . Financial resource strain: Not on file  . Food insecurity:    Worry: Not on file    Inability: Not on file  . Transportation needs:    Medical: Not on file    Non-medical: Not on file  Tobacco Use  . Smoking status: Never Smoker  . Smokeless tobacco: Never Used  Substance and Sexual Activity  . Alcohol use: No  . Drug use: No  . Sexual activity: Yes    Birth control/protection: Surgical    Comment: hysterectomy  Lifestyle  . Physical activity:    Days per week: Not on file    Minutes per session: Not on file  . Stress: Not on file    Relationships  . Social connections:    Talks on phone: Not on file    Gets together: Not on file    Attends religious service: Not on file    Active member of club or organization: Not on file    Attends meetings of clubs or organizations: Not on file    Relationship status: Not on file  Other Topics Concern  . Not on file  Social History Narrative   Patient lives at home with husband [name].    Patient has 1 child.    Patient is not currently working.    Patient has 2 years of college.    Patient is right handed.   Patient drinks caffeine occasionally.    Allergies:  Allergies  Allergen Reactions  . Amoxicillin Shortness Of Breath and Rash  . Brilinta [Ticagrelor] Shortness Of Breath  . Erythromycin Shortness Of Breath, Other (See Comments) and Hives    Trouble swallowing  . Flagyl [Metronidazole] Palpitations and Shortness Of Breath  . Penicillins Hives, Shortness Of Breath, Rash and Other (See Comments)    Has patient had a PCN reaction causing immediate rash, facial/tongue/throat swelling, SOB or lightheadedness with hypotension: Yes Has patient had a PCN reaction causing severe rash involving mucus membranes or skin necrosis: No Has patient had a PCN reaction that required hospitalization: Yes Has patient had a PCN reaction occurring within the last 10 years: No If all of the above answers are "NO", then may proceed with Cephalosporin use.   . Isosorbide Mononitrate [Isosorbide Nitrate] Other (See Comments)    Joint aches, muscles hurt, difficult to walk   . Jardiance [Empagliflozin] Other (See Comments)    Nausea, joint aches, muscles aches  . Metformin And Related Other (See Comments)    Stomach pain, cold sweats, joint pain, burred vision, dizziness  . Tape Other (See Comments)    Skin pulls off with certain types Plastic tape causes skin to rip if left on for long periods of time  . Erythromycin Base Rash    Objective:    Vital Signs:   Temp:  [98.4 F  (36.9 C)] 98.4 F (36.9 C) (12/16 1139) Pulse Rate:  [55] 55 (12/16 1139) Resp:  [18] 18 (12/16 1139) BP: (153)/(72) 153/72 (12/16 1139) SpO2:  [86 %-93 %] 86 % (12/16 1513) Weight:  [142.2 kg] 142.2 kg (12/16 1139)   Filed Weights   01/16/18 1139  Weight: (!) 142.2 kg     Physical Exam     General:  Obese, NAD HEENT: Normal Neck: Thick. JVP difficult, appears elevated. Carotids 2+ bilat; no bruits. No lymphadenopathy or thyromegaly appreciated. Cor: PMI nondisplaced. Regular rate &  rhythm. No rubs, gallops or murmurs. Lungs: Clear anteriorly Abdomen: Soft, nontender, nondistended. No hepatosplenomegaly. No bruits or masses. Good bowel sounds. Extremities: No cyanosis, clubbing, rash. 1+ ankle edema.  Neuro: Alert & oriented x 3, cranial nerves grossly intact. moves all 4 extremities w/o difficulty. Affect pleasant.   Telemetry   NSR in 60s  Labs     Basic Metabolic Panel: No results for input(s): NA, K, CL, CO2, GLUCOSE, BUN, CREATININE, CALCIUM, MG, PHOS in the last 168 hours.  Liver Function Tests: No results for input(s): AST, ALT, ALKPHOS, BILITOT, PROT, ALBUMIN in the last 168 hours. No results for input(s): LIPASE, AMYLASE in the last 168 hours. No results for input(s): AMMONIA in the last 168 hours.  CBC: Recent Labs  Lab 01/16/18 1159  WBC 10.4  HGB 12.5  HCT 41.9  MCV 102.7*  PLT 168    Cardiac Enzymes: No results for input(s): CKTOTAL, CKMB, CKMBINDEX, TROPONINI in the last 168 hours.  BNP: BNP (last 3 results) No results for input(s): BNP in the last 8760 hours.  ProBNP (last 3 results) No results for input(s): PROBNP in the last 8760 hours.   CBG: Recent Labs  Lab 01/16/18 1229  GLUCAP 116*    Coagulation Studies: No results for input(s): LABPROT, INR in the last 72 hours.  Imaging:  No results found.   Assessment/Plan   1. CAD: Occluded native RCA and SVG-RCA. Status post opening of CTO RCA with 4 overlapping Xience DES  in 5/15. DES to sequential SVG-OM1/PLOM in 9/19.  Rare atypical chest pain.  - She will need to continue Plavix for a year but not on aspirin given anticoagulation.   - She has not tolerated Imdur in the past.  Continue ranolazine and Toprol XL.  2. Acute on chronic diastolic CHF:  Echo in 6/12 with EF 55-60%, moderate diastolic dysfunction. Cardiomems was placed today. She was noted to be markedly volume overloaded on RHC and decision was made to admit her.  NYHA class IIIb symptoms.  -Lasix 80 mg IV bid and follow I/Os carefully.  3. Hyperlipidemia: She remains on Zetia, Crestor and Praluent. Lipids mildly higher than goal in 9/19, she is on maximal meds at this time.    4. Hypertension: BP controlled.    5. Cerebrovascular disease: She has history of CVA and had left carotid stent in 2/15. Followed by [facility].  Most recent MRA head showed severe stenosis of supraclinoid RICA and severe basilar artery stenosis.   6. Palpitations: Improved on higher Toprol XL. Event monitor in 2/18 with few PVCs.  7. OHS/OSA: Cannot tolerate CPAP.  Uses oxygen with exertion and at night.  8. Atrial fibrillation: Paroxysmal, symptomatic.  She is in NSR today.   - Continue apixaban 5 mg bid. Will have to overlap with Plavix for at least 6 months.  Will restart tomorrow morning.  - Continue amiodarone given severe symptoms when in atrial fibrillation.   9. CKD: Stage 3.  Creatinine has been been stable recently, needs CMET today.   Loralie Champagne, MD 01/16/2018, 4:19 PM  Advanced Heart Failure Team Pager [phone] (M-F; 7a - 4p)  Please contact [facility] for night-coverage after hours (4p -7a ) and weekends on [email or web address]

## 2018-01-16 NOTE — Interval H&P Note (Signed)
History and Physical Interval Note:  01/16/2018 3:07 PM  [name] [name]  has presented today for surgery, with the diagnosis of HF  The various methods of treatment have been discussed with the patient and family. After consideration of risks, benefits and other options for treatment, the patient has consented to  Procedure(s): PRESSURE SENSOR/CARDIOMEMS (N/A) as a surgical intervention .  The patient's history has been reviewed, patient examined, no change in status, stable for surgery.  I have reviewed the patient's chart and labs.  Questions were answered to the patient's satisfaction.     [name]

## 2018-01-17 ENCOUNTER — Encounter (HOSPITAL_COMMUNITY): Payer: Self-pay | Admitting: Cardiology

## 2018-01-17 DIAGNOSIS — I5043 Acute on chronic combined systolic (congestive) and diastolic (congestive) heart failure: Secondary | ICD-10-CM

## 2018-01-17 LAB — BASIC METABOLIC PANEL
Anion gap: 11 (ref 5–15)
BUN: 18 mg/dL (ref 8–23)
CO2: 31 mmol/L (ref 22–32)
Calcium: 8.9 mg/dL (ref 8.9–10.3)
Chloride: 101 mmol/L (ref 98–111)
Creatinine, Ser: 1.49 mg/dL — ABNORMAL HIGH (ref 0.44–1.00)
GFR calc Af Amer: 41 mL/min — ABNORMAL LOW (ref >60)
GFR calc non Af Amer: 35 mL/min — ABNORMAL LOW (ref >60)
Glucose, Bld: 135 mg/dL — ABNORMAL HIGH (ref 70–99)
Potassium: 4.3 mmol/L (ref 3.5–5.1)
Sodium: 143 mmol/L (ref 135–145)

## 2018-01-17 LAB — POCT I-STAT 3, VENOUS BLOOD GAS (G3P V)
Acid-Base Excess: 4 mmol/L — ABNORMAL HIGH (ref 0.0–2.0)
Bicarbonate: 31 mmol/L — ABNORMAL HIGH (ref 20.0–28.0)
O2 Saturation: 64 %
TCO2: 33 mmol/L — ABNORMAL HIGH (ref 22–32)
pCO2, Ven: 57.9 mmHg (ref 44.0–60.0)
pH, Ven: 7.336 (ref 7.250–7.430)
pO2, Ven: 36 mmHg (ref 32.0–45.0)

## 2018-01-17 LAB — GLUCOSE, CAPILLARY
Glucose-Capillary: 119 mg/dL — ABNORMAL HIGH (ref 70–99)
Glucose-Capillary: 148 mg/dL — ABNORMAL HIGH (ref 70–99)
Glucose-Capillary: 170 mg/dL — ABNORMAL HIGH (ref 70–99)
Glucose-Capillary: 128 mg/dL — ABNORMAL HIGH (ref 70–99)

## 2018-01-17 LAB — HIV ANTIBODY (ROUTINE TESTING W REFLEX): HIV Screen 4th Generation wRfx: NONREACTIVE

## 2018-01-17 MED ORDER — INSULIN ASPART 100 UNIT/ML ~~LOC~~ SOLN
0.0000 [IU] | Freq: Three times a day (TID) | SUBCUTANEOUS | Status: DC
  Administered 2018-01-17: 3 [IU] via SUBCUTANEOUS

## 2018-01-17 MED ORDER — CLOPIDOGREL BISULFATE 75 MG PO TABS: 75.0000 mg | ORAL_TABLET | Freq: Every day | ORAL | Status: DC

## 2018-01-17 MED ORDER — VITAMIN D 25 MCG (1000 UNIT) PO TABS
2000.0000 [IU] | ORAL_TABLET | Freq: Every day | ORAL | Status: DC
  Administered 2018-01-17 – 2018-01-19 (×3): 2000 [IU] via ORAL
  Filled 2018-01-17 (×3): qty 2

## 2018-01-17 MED ORDER — INSULIN ASPART 100 UNIT/ML ~~LOC~~ SOLN: 0.0000 [IU] | Freq: Every day | SUBCUTANEOUS | Status: DC

## 2018-01-17 MED ORDER — CALCIUM CARBONATE 1250 (500 CA) MG PO TABS
1250.0000 mg | ORAL_TABLET | Freq: Every day | ORAL | Status: DC
  Administered 2018-01-17 – 2018-01-19 (×3): 1250 mg via ORAL
  Filled 2018-01-17 (×3): qty 1

## 2018-01-17 MED ORDER — INSULIN ASPART 100 UNIT/ML ~~LOC~~ SOLN: 4.0000 [IU] | Freq: Three times a day (TID) | SUBCUTANEOUS | Status: DC

## 2018-01-17 NOTE — Progress Notes (Signed)
Pt states she takes sliding scale Humalog insulin before each meal. MD notified. No new orders.

## 2018-01-17 NOTE — Care Management Note (Signed)
Case Management Note  Patient Details  Name: [name] [name] [name]: [id number] Date of Birth: December 10, 1945  Subjective/Objective:  CHF               Action/Plan: Patient lives at home with spouse, continues to drive; PCP:  Derinda Late, MD; has private insurance with Medicare/ A[employer] with prescription drug coverage; pharmacy of choice is [facility] in [employer]; patient states that she does not have any problem getting her medication; DME - none; patient and spouse cooks without salt and endorses a heart healthy diet; has scales at home and knows to weigh herself daily; patient plans to go to Outpatient Cardiac Rehab in Jan. No needs identified at this time; CM will continue to follow for progression of care.  Expected Discharge Date:    possibly 01/20/2018              Expected Discharge Plan:  Home/Self Care  Discharge planning Services  CM Consult   Status of Service:  In process, will continue to follow  Sherrilyn Rist [phone] 01/17/2018, 10:04 AM

## 2018-01-17 NOTE — Plan of Care (Signed)
  Problem: Education: Goal: Knowledge of General Education information will improve Description Including pain rating scale, medication(s)/side effects and non-pharmacologic comfort measures Outcome: Progressing   Problem: Education: Goal: Ability to demonstrate management of disease process will improve Outcome: Progressing   Problem: Activity: Goal: Capacity to carry out activities will improve Outcome: Progressing   Problem: Cardiac: Goal: Ability to achieve and maintain adequate cardiopulmonary perfusion will improve Outcome: Progressing   Problem: Education: Goal: Understanding of CV disease, CV risk reduction, and recovery process will improve Outcome: Progressing   Problem: Activity: Goal: Ability to return to baseline activity level will improve Outcome: Progressing   Problem: Cardiovascular: Goal: Ability to achieve and maintain adequate cardiovascular perfusion will improve Outcome: Progressing   Problem: Health Behavior/Discharge Planning: Goal: Ability to safely manage health-related needs after discharge will improve Outcome: Progressing

## 2018-01-17 NOTE — Progress Notes (Signed)
Pt's HR sustaining in the low 40s at the beginning of the shift, dropping as low as 39 BPM. Pt's sustaining HR increased to 40s-50s for much of the shift. Metoprolol held last night. MD made aware.   HR in the 60s only when patient frequently getting out of bed to urinate after receiving Lasix. HR now back to 51 at this time. Will continue to monitor.

## 2018-01-17 NOTE — Progress Notes (Signed)
RN rounded on pt. Pt states she does not need anything at this time. 

## 2018-01-17 NOTE — Progress Notes (Addendum)
Advanced Heart Failure Rounding Note  PCP-Cardiologist: No primary care provider on file.   Subjective:    Admitted from the cath lab with volume overload. Diuresing with IV lasix. Brisk diuresis noted. Weight down 8 pounds.   Remains SOB with exertion. Denies chest pain.    Objective:   Weight Range: (!) 136.2 kg Body mass index is 54.03 kg/m.   Vital Signs:   Temp:  [98 F (36.7 C)-98.4 F (36.9 C)] 98 F (36.7 C) (12/17 0516) Pulse Rate:  [42-288] 51 (12/17 0516) Resp:  [9-57] 18 (12/17 0516) BP: (125-184)/(45-79) 130/53 (12/17 0516) SpO2:  [0 %-100 %] 99 % (12/17 0516) Weight:  [136.2 kg-142.2 kg] 136.2 kg (12/17 0516) Last BM Date: 01/15/18  Weight change: Filed Weights   01/16/18 1139 01/16/18 1848 01/17/18 0516  Weight: (!) 142.2 kg (!) 139.8 kg (!) 136.2 kg    Intake/Output:   Intake/Output Summary (Last 24 hours) at 01/17/2018 0752 Last data filed at 01/17/2018 0600 Gross per 24 hour  Intake 480 ml  Output 3200 ml  Net -2720 ml      Physical Exam    General:   No resp difficulty. In bed.  HEENT: Normal Neck: Supple. JVP 10-11 . Carotids 2+ bilat; no bruits. No lymphadenopathy or thyromegaly appreciated. Cor: PMI nondisplaced. Regular rate & rhythm. No rubs, gallops or murmurs. Lungs: Clear on 1 liter Abdomen: obese, Soft, nontender, nondistended. No hepatosplenomegaly. No bruits or masses. Good bowel sounds. Extremities: No cyanosis, clubbing, rash, R and LLE 1-2+ edema.  Neuro: Alert & orientedx3, cranial nerves grossly intact. moves all 4 extremities w/o difficulty. Affect pleasant   Telemetry   SInus Brady 50s   EKG    N/a   Labs    CBC Recent Labs    01/16/18 1159 01/16/18 2151  WBC 10.4 8.4  NEUTROABS  --  5.3  HGB 12.5 12.8  HCT 41.9 41.5  MCV 102.7* 101.5*  PLT 168 761   Basic Metabolic Panel Recent Labs    01/16/18 2151 01/17/18 0434  NA 144 143  K 4.5 4.3  CL 107 101  CO2 29 31  GLUCOSE 142* 135*  BUN 17  18  CREATININE 1.31* 1.49*  CALCIUM 9.1 8.9  MG 2.2  --    Liver Function Tests Recent Labs    01/16/18 2151  AST 23  ALT 23  ALKPHOS 61  BILITOT 1.8*  PROT 6.6  ALBUMIN 3.6   No results for input(s): LIPASE, AMYLASE in the last 72 hours. Cardiac Enzymes No results for input(s): CKTOTAL, CKMB, CKMBINDEX, TROPONINI in the last 72 hours.  BNP: BNP (last 3 results) Recent Labs    01/16/18 2150  BNP 701.3*    ProBNP (last 3 results) No results for input(s): PROBNP in the last 8760 hours.   D-Dimer No results for input(s): DDIMER in the last 72 hours. Hemoglobin A1C No results for input(s): HGBA1C in the last 72 hours. Fasting Lipid Panel No results for input(s): CHOL, HDL, LDLCALC, TRIG, CHOLHDL, LDLDIRECT in the last 72 hours. Thyroid Function Tests Recent Labs    01/16/18 2151  TSH 2.977    Other results:   Imaging     No results found.   Medications:     Scheduled Medications: . amiodarone  100 mg Oral Daily  . amLODipine  10 mg Oral QHS  . apixaban  5 mg Oral BID  . aspirin EC  81 mg Oral Daily  . B-complex with vitamin C  1 tablet Oral Daily  . buPROPion  150 mg Oral Daily  . calcium carbonate  1,250 mg Oral Q breakfast  . cholecalciferol  2,000 Units Oral Q breakfast  . cloNIDine  0.2 mg Oral BID  . clopidogrel  75 mg Oral QHS  . DULoxetine  30 mg Oral Daily  . ezetimibe  10 mg Oral QHS  . furosemide  80 mg Intravenous BID  . gabapentin  600 mg Oral BID   And  . gabapentin  1,200 mg Oral QHS  . HYDROcodone-acetaminophen  1 tablet Oral QHS  . insulin glargine  50 Units Subcutaneous QHS  . losartan  100 mg Oral Daily  . magnesium oxide  400 mg Oral BID  . methocarbamol  500 mg Oral TID  . metoprolol succinate  25 mg Oral BID  . mometasone-formoterol  2 puff Inhalation BID  . multivitamin  1 tablet Oral Daily  . multivitamin with minerals  1 tablet Oral Daily  . pantoprazole  40 mg Oral BID  . potassium chloride SA  20 mEq Oral BID    . ranolazine  1,000 mg Oral BID  . rosuvastatin  40 mg Oral QHS  . sodium chloride flush  3 mL Intravenous Q12H  . sodium chloride flush  3 mL Intravenous Q12H  . vitamin B-12  1,000 mcg Oral Daily  . zinc sulfate  220 mg Oral Daily  . zolpidem  5 mg Oral QHS     Infusions: . sodium chloride    . sodium chloride 10 mL/hr at 01/16/18 1228  . sodium chloride       PRN Medications:  sodium chloride, sodium chloride, acetaminophen, ALPRAZolam, ipratropium-albuterol, loperamide, meclizine, nitroGLYCERIN, ondansetron (ZOFRAN) IV, ondansetron, polyvinyl alcohol, sodium chloride flush, sodium chloride flush    Patient Profile   72 y.o.with history of CAD s/p CABG, diastolic CHF, and cerebrovascular disease with history of CVA was admitted from [facility]/Cardiomems procedure today due to acute on chronic diastolic CHF.  Assessment/Plan   1. CAD: Occluded native RCA and SVG-RCA. Status post opening of CTO RCA with 4 overlapping Xience DES in 5/15.DES to sequential SVG-OM1/PLOM in 9/19. Rare atypical chest pain.  -She will need to continue Plavix for a year but not on aspirin given anticoagulation. - She has not tolerated Imdur in the past.Continue ranolazine and Toprol XL. 2. Acute on chronic diastolic CHF: Echo in 4/31 with EF 55-60%, moderate diastolic dysfunction. Cardiomems was placed 01/16/2018.  She was noted to be markedly volume overloaded on RHC and decision was made to admit her.  NYHA class IIIb symptoms. - Volume status improving. Continue 80 mg IV lasix twice a day. Renal function stable.   3. Hyperlipidemia: She remains on Zetia, Crestor and Praluent.Lipids mildly higher than goal in 9/19, she is on maximal meds at this time. 4. Hypertension: BP controlled.  5. Cerebrovascular disease: She has history of CVA and had left carotid stent in 2/15. Followed by [facility]. Most recent MRA head showed severe stenosis of supraclinoid RICA and severe basilar artery stenosis.   6. Palpitations: Improved on higher Toprol XL. Event monitor in 2/18 with few PVCs.  7. OHS/OSA: Cannot tolerate CPAP. Uses oxygen with exertion and at night.  8. Atrial fibrillation: Paroxysmal, symptomatic. She is in NSR today.  - Continue apixaban 5 mg bid. Will have to overlap with Plavix for at least 6 months.  Restart plavix today.   - Continue amiodarone given severe symptoms when in atrial fibrillation.  9. CKD: Stage 3.  -  Cratinine 1.5 Follow BMET daily   Home once fully diuresed.      Medication concerns reviewed with patient and pharmacy team. Barriers identified: none   Length of Stay: 1  Amy Clegg, NP  01/17/2018, 7:52 AM  Advanced Heart Failure Team Pager [phone] (M-F; [place])  Please contact [facility] for night-coverage after hours (4p -7a ) and weekends on [email or web address]  Patient seen with NP, agree with the above note.    She diuresed well yesterday, weight down.  Creatinine fairly stable.    She is still volume overloaded on exam but starting to feel better. She will need a couple more days of IV diuresis.  Continue Lasix 80 mg IV bid.   Loralie Champagne 01/17/2018 8:36 AM

## 2018-01-18 ENCOUNTER — Ambulatory Visit (HOSPITAL_COMMUNITY): Payer: Medicare Other

## 2018-01-18 ENCOUNTER — Encounter (HOSPITAL_COMMUNITY): Payer: Medicare Other

## 2018-01-18 LAB — BASIC METABOLIC PANEL
Anion gap: 11 (ref 5–15)
BUN: 23 mg/dL (ref 8–23)
CO2: 33 mmol/L — ABNORMAL HIGH (ref 22–32)
Calcium: 8.7 mg/dL — ABNORMAL LOW (ref 8.9–10.3)
Chloride: 97 mmol/L — ABNORMAL LOW (ref 98–111)
Creatinine, Ser: 1.84 mg/dL — ABNORMAL HIGH (ref 0.44–1.00)
GFR calc Af Amer: 32 mL/min — ABNORMAL LOW (ref >60)
GFR calc non Af Amer: 27 mL/min — ABNORMAL LOW (ref >60)
Glucose, Bld: 77 mg/dL (ref 70–99)
Potassium: 3.9 mmol/L (ref 3.5–5.1)
Sodium: 141 mmol/L (ref 135–145)

## 2018-01-18 LAB — GLUCOSE, CAPILLARY
Glucose-Capillary: 78 mg/dL (ref 70–99)
Glucose-Capillary: 140 mg/dL — ABNORMAL HIGH (ref 70–99)
Glucose-Capillary: 97 mg/dL (ref 70–99)
Glucose-Capillary: 116 mg/dL — ABNORMAL HIGH (ref 70–99)

## 2018-01-18 MED ORDER — TORSEMIDE 100 MG PO TABS: 100.0000 mg | ORAL_TABLET | Freq: Two times a day (BID) | ORAL | Status: DC

## 2018-01-18 MED ORDER — METOPROLOL SUCCINATE ER 25 MG PO TB24
25.0000 mg | ORAL_TABLET | Freq: Every day | ORAL | Status: DC
  Filled 2018-01-18: qty 1

## 2018-01-18 MED ORDER — INSULIN ASPART 100 UNIT/ML ~~LOC~~ SOLN
4.0000 [IU] | Freq: Three times a day (TID) | SUBCUTANEOUS | Status: DC
  Administered 2018-01-18 – 2018-01-19 (×3): 4 [IU] via SUBCUTANEOUS

## 2018-01-18 MED ORDER — INSULIN ASPART 100 UNIT/ML ~~LOC~~ SOLN
0.0000 [IU] | Freq: Three times a day (TID) | SUBCUTANEOUS | Status: DC
  Administered 2018-01-18: 2 [IU] via SUBCUTANEOUS

## 2018-01-18 NOTE — Progress Notes (Signed)
HR decreased as low as 38 bpm. RN came to pt's room. Pt resting. HR sustaining in the low 40s. Will continue to monitor.

## 2018-01-18 NOTE — Progress Notes (Signed)
CARDIAC REHAB PHASE I   PRE:  Rate/Rhythm: 51 SB  BP:  Supine:   Sitting: 136/56  Standing:    SaO2: 91%RA  MODE:  Ambulation: 40 ft   POST:  Rate/Rhythm: 62  BP:  Supine:   Sitting: 102/55  Standing:    SaO2: 92%RA 1400-1500 Pt walked 40 ft on RA with rolling walker with steady gait. Used pursed lip breathing to help SOB. To sitting on side of bed. Gave pt CHF booklet and low sodium diets. Discussed when to call MD with signs of CHF.  Encouraged daily weights.  Pt voiced understanding.    Graylon Good, RN BSN  01/18/2018 2:54 PM

## 2018-01-18 NOTE — Progress Notes (Signed)
Patients capillary blood glucose 78 this morning, patient states she felt a little shaky, but eating her breakfast would help that. Says that a blood glucose lower than 100 is when she starts getting symptomatic and that her body is sensitive.  Blood glucose now 140, says she will take her sliding scale insulin, but will let RN know about correction insulin dose. Will continue to monitor.

## 2018-01-18 NOTE — Progress Notes (Signed)
Patient has post hospital appt on 12/8 at 10 AM in the [facility].

## 2018-01-18 NOTE — Plan of Care (Signed)
  Problem: Education: Goal: Knowledge of General Education information will improve Description: Including pain rating scale, medication(s)/side effects and non-pharmacologic comfort measures Outcome: Progressing   Problem: Education: Goal: Ability to demonstrate management of disease process will improve Outcome: Progressing   

## 2018-01-18 NOTE — Progress Notes (Addendum)
Advanced Heart Failure Rounding Note  PCP-Cardiologist: No primary care provider on file.   Subjective:    Admitted from the cath lab with volume overload.   Continues to diurese with IV lasix. Brisk diuresis noted. Weight down another 6 pounds. Creatinine trending up 1.5>1.8.   Denies SOB. Having some dizziness when she stands. Wants to shower.     Objective:   Weight Range: 133.5 kg Body mass index is 52.97 kg/m.   Vital Signs:   Temp:  [97.8 F (36.6 C)-98.7 F (37.1 C)] 98 F (36.7 C) (12/18 0407) Pulse Rate:  [40-60] 40 (12/18 0635) Resp:  [17-18] 18 (12/17 1932) BP: (111-158)/(51-58) 111/52 (12/18 0407) SpO2:  [88 %-98 %] 98 % (12/18 0739) Weight:  [133.5 kg] 133.5 kg (12/18 0407) Last BM Date: 01/17/18  Weight change: Filed Weights   01/16/18 1848 01/17/18 0516 01/18/18 0407  Weight: (!) 139.8 kg (!) 136.2 kg 133.5 kg    Intake/Output:   Intake/Output Summary (Last 24 hours) at 01/18/2018 0744 Last data filed at 01/18/2018 0000 Gross per 24 hour  Intake 1330 ml  Output 5000 ml  Net -3670 ml      Physical Exam    General:  In bed. No resp difficulty HEENT: normal Neck: supple. JVP 6-7 .  Carotids 2+ bilat; no bruits. No lymphadenopathy or thryomegaly appreciated. Cor: PMI nondisplaced. Regular rate & rhythm. No rubs, gallops or murmurs. Lungs: clear Abdomen: obese, soft, nontender, nondistended. No hepatosplenomegaly. No bruits or masses. Good bowel sounds. Extremities: no cyanosis, clubbing, rash, R and LLE trace -1+ edema Neuro: alert & orientedx3, cranial nerves grossly intact. moves all 4 extremities w/o difficulty. Affect pleasant   Telemetry   SInus Brady 40-50s    EKG    N/a   Labs    CBC Recent Labs    01/16/18 1159 01/16/18 2151  WBC 10.4 8.4  NEUTROABS  --  5.3  HGB 12.5 12.8  HCT 41.9 41.5  MCV 102.7* 101.5*  PLT 168 500   Basic Metabolic Panel Recent Labs    01/16/18 2151 01/17/18 0434 01/18/18 0359  NA  144 143 141  K 4.5 4.3 3.9  CL 107 101 97*  CO2 29 31 33*  GLUCOSE 142* 135* 77  BUN 17 18 23   CREATININE 1.31* 1.49* 1.84*  CALCIUM 9.1 8.9 8.7*  MG 2.2  --   --    Liver Function Tests Recent Labs    01/16/18 2151  AST 23  ALT 23  ALKPHOS 61  BILITOT 1.8*  PROT 6.6  ALBUMIN 3.6   No results for input(s): LIPASE, AMYLASE in the last 72 hours. Cardiac Enzymes No results for input(s): CKTOTAL, CKMB, CKMBINDEX, TROPONINI in the last 72 hours.  BNP: BNP (last 3 results) Recent Labs    01/16/18 2150  BNP 701.3*    ProBNP (last 3 results) No results for input(s): PROBNP in the last 8760 hours.   D-Dimer No results for input(s): DDIMER in the last 72 hours. Hemoglobin A1C No results for input(s): HGBA1C in the last 72 hours. Fasting Lipid Panel No results for input(s): CHOL, HDL, LDLCALC, TRIG, CHOLHDL, LDLDIRECT in the last 72 hours. Thyroid Function Tests Recent Labs    01/16/18 2151  TSH 2.977    Other results:   Imaging    No results found.   Medications:     Scheduled Medications: . amiodarone  100 mg Oral Daily  . amLODipine  10 mg Oral QHS  . apixaban  5 mg Oral BID  . B-complex with vitamin C  1 tablet Oral Daily  . buPROPion  150 mg Oral Daily  . calcium carbonate  1,250 mg Oral Q breakfast  . cholecalciferol  2,000 Units Oral Q breakfast  . cloNIDine  0.2 mg Oral BID  . clopidogrel  75 mg Oral QHS  . DULoxetine  30 mg Oral Daily  . ezetimibe  10 mg Oral QHS  . furosemide  80 mg Intravenous BID  . gabapentin  600 mg Oral BID   And  . gabapentin  1,200 mg Oral QHS  . HYDROcodone-acetaminophen  1 tablet Oral QHS  . insulin aspart  0-15 Units Subcutaneous TID WC  . insulin aspart  0-5 Units Subcutaneous QHS  . insulin aspart  4 Units Subcutaneous TID WC  . insulin glargine  50 Units Subcutaneous QHS  . losartan  100 mg Oral Daily  . magnesium oxide  400 mg Oral BID  . methocarbamol  500 mg Oral TID  . metoprolol succinate  25 mg  Oral BID  . mometasone-formoterol  2 puff Inhalation BID  . multivitamin  1 tablet Oral Daily  . multivitamin with minerals  1 tablet Oral Daily  . pantoprazole  40 mg Oral BID  . potassium chloride SA  20 mEq Oral BID  . ranolazine  1,000 mg Oral BID  . rosuvastatin  40 mg Oral QHS  . sodium chloride flush  3 mL Intravenous Q12H  . sodium chloride flush  3 mL Intravenous Q12H  . vitamin B-12  1,000 mcg Oral Daily  . zinc sulfate  220 mg Oral Daily  . zolpidem  5 mg Oral QHS    Infusions: . sodium chloride    . sodium chloride 10 mL/hr at 01/16/18 1228  . sodium chloride      PRN Medications: sodium chloride, sodium chloride, acetaminophen, ALPRAZolam, ipratropium-albuterol, loperamide, meclizine, nitroGLYCERIN, ondansetron (ZOFRAN) IV, ondansetron, polyvinyl alcohol, sodium chloride flush, sodium chloride flush    Patient Profile   72 y.o.with history of CAD s/p CABG, diastolic CHF, and cerebrovascular disease with history of CVA was admitted from [facility]/Cardiomems procedure today due to acute on chronic diastolic CHF.  Assessment/Plan   1. CAD: Occluded native RCA and SVG-RCA. Status post opening of CTO RCA with 4 overlapping Xience DES in 5/15.DES to sequential SVG-OM1/PLOM in 9/19. Rare atypical chest pain.  -No s/s ischemia.  -She will need to continue Plavix for a year but not on aspirin given anticoagulation. - She has not tolerated Imdur in the past.Continue ranolazine and Toprol XL. 2. Acute on chronic diastolic CHF: Echo in 4/09 with EF 55-60%, moderate diastolic dysfunction. Cardiomems was placed 01/16/2018.  She was noted to be markedly volume overloaded on RHC and decision was made to admit her.  NYHA class IIIb symptoms. - Volume status much improved. Creatinine trending up 1.5>1.8. Stop IV lasix. Anticpate starting torsemide 100 mg twwce a day tomorrow..   3. Hyperlipidemia: She remains on Zetia, Crestor and Praluent.Lipids mildly higher than goal in  9/19, she is on maximal meds at this time. 4. Hypertension: BP controlled.  5. Cerebrovascular disease: She has history of CVA and had left carotid stent in 2/15. Followed by [facility]. Most recent MRA head showed severe stenosis of supraclinoid RICA and severe basilar artery stenosis.  6. Palpitations: Cut back toprol XL to 25 mg at bed time. Event monitor in 2/18 with few PVCs.  7. OHS/OSA: Cannot tolerate CPAP. Uses oxygen with exertion and at  night.  8. Atrial fibrillation: Paroxysmal, symptomatic. She is in NSR/Sinus brady today.  - Continue apixaban 5 mg bid. Will have to overlap with Plavix for at least 6 months.  Continue  plavix today.   - Continue amiodarone given severe symptoms when in atrial fibrillation. Bradycardic at night down to the 40s.  9. CKD: Stage 3.  - Creatinine trending up 1.5>1.8. Hold IV lasix.    Home once fully diuresed.      Medication concerns reviewed with patient and pharmacy team. Barriers identified: none   Length of Stay: 2  Amy Clegg, NP  01/18/2018, 7:44 AM  Advanced Heart Failure Team Pager [phone] (M-F; 7a - 4p)  Please contact [facility] for night-coverage after hours (4p -7a ) and weekends on [email or web address]  Patient seen with PA, agree with the above note.  Weight is down about 19 lbs since admission.  On exam, JVP remains mildly elevated in the 9-10 range with some residual peripheral edema but creatinine is up to 1.8.  - Hold diuretics this morning, would start on torsemide 100 mg bid this evening.   - If creatinine stable tomorrow, will likely let her go home.   Loralie Champagne 01/18/2018 2:22 PM

## 2018-01-19 ENCOUNTER — Ambulatory Visit: Payer: Medicare Other | Admitting: Neurology

## 2018-01-19 LAB — BASIC METABOLIC PANEL
Anion gap: 11 (ref 5–15)
BUN: 26 mg/dL — ABNORMAL HIGH (ref 8–23)
CO2: 33 mmol/L — ABNORMAL HIGH (ref 22–32)
Calcium: 8.9 mg/dL (ref 8.9–10.3)
Chloride: 97 mmol/L — ABNORMAL LOW (ref 98–111)
Creatinine, Ser: 1.76 mg/dL — ABNORMAL HIGH (ref 0.44–1.00)
GFR calc Af Amer: 33 mL/min — ABNORMAL LOW (ref >60)
GFR calc non Af Amer: 29 mL/min — ABNORMAL LOW (ref >60)
Glucose, Bld: 92 mg/dL (ref 70–99)
Potassium: 3.6 mmol/L (ref 3.5–5.1)
Sodium: 141 mmol/L (ref 135–145)

## 2018-01-19 LAB — GLUCOSE, CAPILLARY
Glucose-Capillary: 91 mg/dL (ref 70–99)
Glucose-Capillary: 99 mg/dL (ref 70–99)

## 2018-01-19 MED ORDER — METOPROLOL SUCCINATE ER 25 MG PO TB24: 25.0000 mg | ORAL_TABLET | Freq: Every day | ORAL | 3 refills | Status: DC

## 2018-01-19 MED ORDER — TORSEMIDE 20 MG PO TABS: 100.0000 mg | ORAL_TABLET | Freq: Two times a day (BID) | ORAL | 3 refills | Status: DC

## 2018-01-19 NOTE — Progress Notes (Signed)
Pt discharge instructions reviewed with pt. Pt verbalizes understanding and state they have no questions. Pt belongings with pt. Pt is not in distress. Pt discharged via wheelchair. Pt's husband is driving her home.

## 2018-01-19 NOTE — Discharge Summary (Signed)
Advanced Heart Failure Team  Discharge Summary   Patient ID: [name] [name] [name]: [id number], DOB/AGE: 03-Jun-1945 72 y.o. Admit date: 01/16/2018 D/C date:     01/19/2018   Primary Discharge Diagnoses:  1. CAD: Occluded native RCA and SVG-RCA. Status post opening of CTO RCA with 4 overlapping Xience DES in 5/15.DES to sequential SVG-OM1/PLOM in 9/19. Rare atypical chest pain.  2.Acute on chronic diastolic CHF 3. Hyperlipidemia 4. Hypertension:BP controlled. 5. Cerebrovascular disease 6. Palpitations 7. OHS/OSA 8. Atrial fibrillation: Paroxysmal, symptomatic.  9. CKD: Stage 3.   Hospital Course:  [name] [name] is a 72 y.o.with history of CAD s/p CABG, diastolic CHF, and cerebrovascular disease with history of CVAwas admitted from [facility]/Cardiomems procedure today due to acute on chronic diastolic CHF. She was diuresed with IV lasix and transitioned to torsemide 100 mg twice a day and she will continue metolazone once a week. She will continue perform cardiomems reading daily. She will continue to be followed closely in the HF clinic.Plan to check BMET next week.   1. CAD: Occluded native RCA and SVG-RCA. Status post opening of CTO RCA with 4 overlapping Xience DES in 5/15.DES to sequential SVG-OM1/PLOM in 9/19. Rare atypical chest pain.  -No s/s ischemia  -She will need to continue Plavixfor a year but not on aspiringiven anticoagulation. - She has not tolerated Imdur in the past.Continue ranolazine and Toprol XL. 2.Acute on chronic diastolic CHF: Echo in 6/38 with EF 55-60%, moderate diastolic dysfunction.Cardiomems was placed 01/16/2018.  She was noted to be markedly volume overloaded on RHC and decision was made to admit her. NYHA class IIIb symptoms. Diuresed with IV lasix and transitioned to torsemide 100 mg twice a day and metolazone weekly. Renal function was followed closely.   3. Hyperlipidemia: She remains on Zetia, Crestor and Praluent.Lipids mildly higher  than goal in 9/19, she is on maximal meds at this time. 4. Hypertension:BP controlled. 5. Cerebrovascular disease: She has history of CVA and had left carotid stent in 2/15. Followed by [facility]. Most recent MRA head showed severe stenosis of supraclinoid RICA and severe basilar artery stenosis.  6. Palpitations: Bradycardic so toprol xl was cut back.   7. OHS/OSA: Cannot tolerate CPAP. Uses oxygen with exertion and at night.  8. Atrial fibrillation: Paroxysmal, symptomatic. She is in NSR/Sinus brady today.  - Continue apixaban 5 mg bid. Will have to overlap with Plavix for at least 6 months.Continue  Plavix.  - Continue amiodarone given severe symptoms when in atrial fibrillation.Bradycardic at night down to the 40s. Cut back toprol xl to 25 mg daily.  9. CKD: Stage 3.  - Renal function followed closely    Discharge Weight : 295 pounds  Discharge Vitals: Blood pressure 139/71, pulse (!) 53, temperature 97.6 F (36.4 C), temperature source Oral, resp. rate 20, height 5' 2.5" (1.588 m), weight 133.8 kg, SpO2 95 %.  Labs: Lab Results  Component Value Date   WBC 8.4 01/16/2018   HGB 12.8 01/16/2018   HCT 41.5 01/16/2018   MCV 101.5 (H) 01/16/2018   PLT 160 01/16/2018    Recent Labs  Lab 01/16/18 2151  01/19/18 0359  NA 144   < > 141  K 4.5   < > 3.6  CL 107   < > 97*  CO2 29   < > 33*  BUN 17   < > 26*  CREATININE 1.31*   < > 1.76*  CALCIUM 9.1   < > 8.9  PROT 6.6  --   --  BILITOT 1.8*  --   --   ALKPHOS 61  --   --   ALT 23  --   --   AST 23  --   --   GLUCOSE 142*   < > 92   < > = values in this interval not displayed.   Lab Results  Component Value Date   CHOL 181 10/21/2017   HDL 42 10/21/2017   LDLCALC 108 (H) 10/21/2017   TRIG 157 (H) 10/21/2017   BNP (last 3 results) Recent Labs    01/16/18 2150  BNP 701.3*    ProBNP (last 3 results) No results for input(s): PROBNP in the last 8760 hours.   Diagnostic Studies/Procedures   No results  found.  Discharge Medications   Allergies as of 01/19/2018      Reactions   Amoxicillin Shortness Of Breath, Rash   Brilinta [ticagrelor] Shortness Of Breath   Erythromycin Shortness Of Breath, Other (See Comments), Hives   Trouble swallowing   Flagyl [metronidazole] Palpitations, Shortness Of Breath   Penicillins Hives, Shortness Of Breath, Rash, Other (See Comments)   Has patient had a PCN reaction causing immediate rash, facial/tongue/throat swelling, SOB or lightheadedness with hypotension: Yes Has patient had a PCN reaction causing severe rash involving mucus membranes or skin necrosis: No Has patient had a PCN reaction that required hospitalization: Yes Has patient had a PCN reaction occurring within the last 10 years: No If all of the above answers are "NO", then may proceed with Cephalosporin use.   Isosorbide Mononitrate [isosorbide Nitrate] Other (See Comments)   Joint aches, muscles hurt, difficult to walk   Jardiance [empagliflozin] Other (See Comments)   Nausea, joint aches, muscles aches   Metformin And Related Other (See Comments)   Stomach pain, cold sweats, joint pain, burred vision, dizziness   Tape Other (See Comments)   Skin pulls off with certain types Plastic tape causes skin to rip if left on for long periods of time   Erythromycin Base Rash      Medication List    STOP taking these medications   aspirin EC 81 MG tablet     TAKE these medications   acetaminophen 500 MG tablet Commonly known as:  TYLENOL Take 1,000 mg by mouth every 6 (six) hours as needed for mild pain or headache.   ADVAIR DISKUS 250-50 MCG/DOSE Aepb Generic drug:  Fluticasone-Salmeterol Inhale 1 puff into the lungs 2 (two) times daily.   albuterol (2.5 MG/3ML) 0.083% nebulizer solution Commonly known as:  PROVENTIL Take 2.5 mg by nebulization every 4 (four) hours as needed for wheezing or shortness of breath.   albuterol 90 MCG/ACT inhaler Commonly known as:   PROVENTIL,VENTOLIN Inhale 2 puffs into the lungs every 4 (four) hours as needed for wheezing or shortness of breath.   Alirocumab 150 MG/ML Sopn Commonly known as:  PRALUENT Inject 1 pen into the skin every 14 (fourteen) days.   ALPRAZolam 0.5 MG tablet Commonly known as:  XANAX Take 0.5-1 mg by mouth See admin instructions. Take 0.5 mg by mouth in the morning. Take 0.5 mg by mouth at noon if needed for anxiety. Take 1 mg by mouth at bedtime.   amiodarone 100 MG tablet Commonly known as:  PACERONE Take 1 tablet (100 mg total) by mouth daily.   amLODipine 10 MG tablet Commonly known as:  NORVASC Take 10 mg by mouth at bedtime.   apixaban 5 MG Tabs tablet Commonly known as:  ELIQUIS Take 1 tablet (  5 mg total) by mouth 2 (two) times daily.   b complex vitamins capsule Take 1 capsule by mouth daily.   buPROPion 150 MG 24 hr tablet Commonly known as:  WELLBUTRIN XL Take 150 mg by mouth daily.   calcium carbonate 1500 (600 Ca) MG Tabs tablet Commonly known as:  OSCAL Take 1,500 mg by mouth daily with breakfast.   cloNIDine 0.2 MG tablet Commonly known as:  CATAPRES Take 1 tablet (0.2 mg total) by mouth 2 (two) times daily.   clopidogrel 75 MG tablet Commonly known as:  PLAVIX TAKE ONE (1) TABLET EACH DAY What changed:  See the new instructions.   denosumab 60 MG/ML Soln injection Commonly known as:  PROLIA Inject 60 mg into the skin every 6 (six) months. Administer in upper arm, thigh, or abdomen   DULoxetine 30 MG capsule Commonly known as:  CYMBALTA Take 1 capsule (30 mg total) by mouth 2 (two) times daily. What changed:  when to take this   ezetimibe 10 MG tablet Commonly known as:  ZETIA TAKE ONE (1) TABLET EACH DAY What changed:  See the new instructions.   gabapentin 600 MG tablet Commonly known as:  NEURONTIN Take 600-1,200 mg by mouth See admin instructions. Take 600 mg by mouth in the morning and take 600 mg by mouth in the afternoon. Take 1200 mg by  mouth at bedtime.   HUMALOG KWIKPEN 100 UNIT/ML KwikPen Generic drug:  insulin lispro Inject 18-26 Units into the skin 3 (three) times daily before meals. Per sliding scale   HYDROcodone-acetaminophen 5-325 MG tablet Commonly known as:  NORCO/VICODIN Take 1 tablet by mouth at bedtime.   ipratropium-albuterol 0.5-2.5 (3) MG/3ML Soln Commonly known as:  DUONEB Take 3 mLs by nebulization every 4 (four) hours as needed (for severe shortness of breath or wheezing).   LANTUS SOLOSTAR 100 UNIT/ML Solostar Pen Generic drug:  Insulin Glargine Inject 50 Units into the skin at bedtime.   loperamide 2 MG capsule Commonly known as:  IMODIUM Take 2 mg by mouth as needed for diarrhea or loose stools.   losartan 100 MG tablet Commonly known as:  COZAAR Take 100 mg by mouth daily.   magnesium oxide 400 MG tablet Commonly known as:  MAG-OX Take 400 mg by mouth 2 (two) times daily.   meclizine 12.5 MG tablet Commonly known as:  ANTIVERT Take 12.5 mg by mouth 3 (three) times daily as needed for dizziness.   methocarbamol 500 MG tablet Commonly known as:  ROBAXIN TAKE ONE (1) TABLET THREE (3) TIMES EACH DAY What changed:    how much to take  how to take this  when to take this  additional instructions   metolazone 2.5 MG tablet Commonly known as:  ZAROXOLYN Take 1 tablet (2.5 mg total) by mouth once a week. Take 1 Tablet once weekly on Mondays. What changed:    when to take this  additional instructions   metoprolol succinate 25 MG 24 hr tablet Commonly known as:  TOPROL-XL Take 1 tablet (25 mg total) by mouth daily. Start taking on:  January 20, 2018 What changed:  when to take this   multivitamin capsule Take 1 capsule by mouth daily.   multivitamin-lutein Caps capsule Take 1 capsule by mouth daily.   nitroGLYCERIN 0.3 MG SL tablet Commonly known as:  NITROSTAT Place 1 tablet (0.3 mg total) under the tongue every 5 (five) minutes x 3 doses as needed for chest  pain.   ondansetron 4 MG tablet  Commonly known as:  ZOFRAN Take 1 tablet (4 mg total) by mouth 2 (two) times daily as needed. For nausea What changed:    when to take this  additional instructions   OXYGEN Inhale 2 L into the lungs at bedtime.   pantoprazole 40 MG tablet Commonly known as:  PROTONIX Take 1 tablet (40 mg total) by mouth 2 (two) times daily.   potassium chloride SA 20 MEQ tablet Commonly known as:  K-DUR,KLOR-CON Take 1 tablet (20 mEq total) by mouth 2 (two) times daily.   RANEXA 1000 MG SR tablet Generic drug:  ranolazine Take 1 tablet (1,000 mg total) by mouth 2 (two) times daily.   REFRESH 1.4-0.6 % Soln Generic drug:  Polyvinyl Alcohol-Povidone PF Place 2 drops into both eyes 2 (two) times daily as needed (for dry eyes).   rosuvastatin 40 MG tablet Commonly known as:  CRESTOR TAKE ONE (1) TABLET EACH DAY What changed:  See the new instructions.   torsemide 20 MG tablet Commonly known as:  DEMADEX Take 5 tablets (100 mg total) by mouth 2 (two) times daily. What changed:  how much to take   triamcinolone cream 0.1 % Commonly known as:  KENALOG Apply 1 application topically 2 (two) times daily.   vitamin B-12 1000 MCG tablet Commonly known as:  CYANOCOBALAMIN Take 1,000 mcg by mouth daily.   Vitamin D 50 MCG (2000 UT) tablet Take 2,000 Units by mouth daily with breakfast.   zinc gluconate 50 MG tablet Take 50 mg by mouth daily.   zolpidem 10 MG tablet Commonly known as:  AMBIEN Take 10 mg by mouth at bedtime.       Disposition   The patient will be discharged in stable condition to home. Discharge Instructions    (HEART FAILURE PATIENTS) Call MD:  Anytime you have any of the following symptoms: 1) 3 pound weight gain in 24 hours or 5 pounds in 1 week 2) shortness of breath, with or without a dry hacking cough 3) swelling in the hands, feet or stomach 4) if you have to sleep on extra pillows at night in order to breathe.   Complete by:   As directed    Diet - low sodium heart healthy   Complete by:  As directed    Increase activity slowly   Complete by:  As directed      Follow-up Information    [place]. Go on 02/08/2018.   Specialty:  Cardiology Why:  at 10:00AM in the Advanced Heart Failure clinic--Please bring all medications to appt.  [place] code [phone] for January  Contact information: [address] [phone] [place] [phone]            Duration of Discharge Encounter: Greater than 35 minutes   Signed, Darrick Grinder NP-C  01/19/2018, 10:54 AM

## 2018-01-19 NOTE — Care Management Important Message (Signed)
Important Message  Patient Details  Name: [name] [name] [name]: [id number] Date of Birth: 1945/09/01   Medicare Important Message Given:  Yes    [name] 01/19/2018, 11:31 AM

## 2018-01-19 NOTE — Progress Notes (Addendum)
Advanced Heart Failure Rounding Note  PCP-Cardiologist: No primary care provider on file.   Subjective:    Admitted from the cath lab with volume overload.  Yesterday IV lasix was stopped and she switched to torsemide 100 mg.   Feeling much better. Wants to go home. Denies SOB.   Objective:   Weight Range: 133.8 kg Body mass index is 53.1 kg/m.   Vital Signs:   Temp:  [97.6 F (36.4 C)-98.2 F (36.8 C)] 97.6 F (36.4 C) (12/19 0414) Pulse Rate:  [40-53] 53 (12/19 0835) Resp:  [18-20] 20 (12/19 0414) BP: (109-139)/(38-71) 139/71 (12/19 0835) SpO2:  [87 %-97 %] 95 % (12/19 0856) Weight:  [133.8 kg] 133.8 kg (12/19 0300) Last BM Date: 01/17/18  Weight change: Filed Weights   01/17/18 0516 01/18/18 0407 01/19/18 0300  Weight: (!) 136.2 kg 133.5 kg 133.8 kg    Intake/Output:   Intake/Output Summary (Last 24 hours) at 01/19/2018 0953 Last data filed at 01/19/2018 0800 Gross per 24 hour  Intake 1200 ml  Output 1400 ml  Net -200 ml      Physical Exam    General:  Well appearing. No resp difficulty.SItting on the side of bed.  HEENT: normal Neck: supple. JVP 6-7 Carotids 2+ bilat; no bruits. No lymphadenopathy or thryomegaly appreciated. Cor: PMI nondisplaced. Regular rate & rhythm. No rubs, gallops or murmurs. Lungs: clear Abdomen: obese, soft, nontender, nondistended. No hepatosplenomegaly. No bruits or masses. Good bowel sounds. Extremities: no cyanosis, clubbing, rash, R and LLE trace edema. edema Neuro: alert & orientedx3, cranial nerves grossly intact. moves all 4 extremities w/o difficulty. Affect pleasant   Telemetry   Sinus Brady 40s   EKG    N/a   Labs    CBC Recent Labs    01/16/18 1159 01/16/18 2151  WBC 10.4 8.4  NEUTROABS  --  5.3  HGB 12.5 12.8  HCT 41.9 41.5  MCV 102.7* 101.5*  PLT 168 767   Basic Metabolic Panel Recent Labs    01/16/18 2151  01/18/18 0359 01/19/18 0359  NA 144   < > 141 141  K 4.5   < > 3.9 3.6  CL  107   < > 97* 97*  CO2 29   < > 33* 33*  GLUCOSE 142*   < > 77 92  BUN 17   < > 23 26*  CREATININE 1.31*   < > 1.84* 1.76*  CALCIUM 9.1   < > 8.7* 8.9  MG 2.2  --   --   --    < > = values in this interval not displayed.   Liver Function Tests Recent Labs    01/16/18 2151  AST 23  ALT 23  ALKPHOS 61  BILITOT 1.8*  PROT 6.6  ALBUMIN 3.6   No results for input(s): LIPASE, AMYLASE in the last 72 hours. Cardiac Enzymes No results for input(s): CKTOTAL, CKMB, CKMBINDEX, TROPONINI in the last 72 hours.  BNP: BNP (last 3 results) Recent Labs    01/16/18 2150  BNP 701.3*    ProBNP (last 3 results) No results for input(s): PROBNP in the last 8760 hours.   D-Dimer No results for input(s): DDIMER in the last 72 hours. Hemoglobin A1C No results for input(s): HGBA1C in the last 72 hours. Fasting Lipid Panel No results for input(s): CHOL, HDL, LDLCALC, TRIG, CHOLHDL, LDLDIRECT in the last 72 hours. Thyroid Function Tests Recent Labs    01/16/18 2151  TSH 2.977  Other results:   Imaging    No results found.   Medications:     Scheduled Medications: . amiodarone  100 mg Oral Daily  . amLODipine  10 mg Oral QHS  . apixaban  5 mg Oral BID  . B-complex with vitamin C  1 tablet Oral Daily  . buPROPion  150 mg Oral Daily  . calcium carbonate  1,250 mg Oral Q breakfast  . cholecalciferol  2,000 Units Oral Q breakfast  . cloNIDine  0.2 mg Oral BID  . clopidogrel  75 mg Oral QHS  . DULoxetine  30 mg Oral Daily  . ezetimibe  10 mg Oral QHS  . gabapentin  600 mg Oral BID   And  . gabapentin  1,200 mg Oral QHS  . HYDROcodone-acetaminophen  1 tablet Oral QHS  . insulin aspart  0-15 Units Subcutaneous TID WC  . insulin aspart  0-5 Units Subcutaneous QHS  . insulin aspart  4 Units Subcutaneous TID WC  . insulin glargine  50 Units Subcutaneous QHS  . losartan  100 mg Oral Daily  . magnesium oxide  400 mg Oral BID  . methocarbamol  500 mg Oral TID  . metoprolol  succinate  25 mg Oral QHS  . mometasone-formoterol  2 puff Inhalation BID  . multivitamin  1 tablet Oral Daily  . multivitamin with minerals  1 tablet Oral Daily  . pantoprazole  40 mg Oral BID  . ranolazine  1,000 mg Oral BID  . rosuvastatin  40 mg Oral QHS  . sodium chloride flush  3 mL Intravenous Q12H  . sodium chloride flush  3 mL Intravenous Q12H  . torsemide  100 mg Oral BID  . vitamin B-12  1,000 mcg Oral Daily  . zinc sulfate  220 mg Oral Daily  . zolpidem  5 mg Oral QHS    Infusions: . sodium chloride    . sodium chloride 10 mL/hr at 01/16/18 1228  . sodium chloride      PRN Medications: sodium chloride, sodium chloride, acetaminophen, ALPRAZolam, ipratropium-albuterol, loperamide, meclizine, nitroGLYCERIN, ondansetron (ZOFRAN) IV, ondansetron, polyvinyl alcohol, sodium chloride flush, sodium chloride flush    Patient Profile   72 y.o.with history of CAD s/p CABG, diastolic CHF, and cerebrovascular disease with history of CVA was admitted from [facility]/Cardiomems procedure today due to acute on chronic diastolic CHF.  Assessment/Plan   1. CAD: Occluded native RCA and SVG-RCA. Status post opening of CTO RCA with 4 overlapping Xience DES in 5/15.DES to sequential SVG-OM1/PLOM in 9/19. Rare atypical chest pain.  -No s/s ischemia  -She will need to continue Plavix for a year but not on aspirin given anticoagulation. - She has not tolerated Imdur in the past.Continue ranolazine and Toprol XL. 2. Acute on chronic diastolic CHF: Echo in 2/95 with EF 55-60%, moderate diastolic dysfunction. Cardiomems was placed 01/16/2018.  She was noted to be markedly volume overloaded on RHC and decision was made to admit her.  NYHA class IIIb symptoms. - Volume status stable. Continue torsemide 100 mg twice a day. Creatinine ok. .   3. Hyperlipidemia: She remains on Zetia, Crestor and Praluent.Lipids mildly higher than goal in 9/19, she is on maximal meds at this time. 4.  Hypertension: BP controlled.  5. Cerebrovascular disease: She has history of CVA and had left carotid stent in 2/15. Followed by [facility]. Most recent MRA head showed severe stenosis of supraclinoid RICA and severe basilar artery stenosis.  6. Palpitations: WIll need to stop toprl xl with  bradycardia  7. OHS/OSA: Cannot tolerate CPAP. Uses oxygen with exertion and at night.  8. Atrial fibrillation: Paroxysmal, symptomatic. She is in NSR/Sinus brady today.  - Continue apixaban 5 mg bid. Will have to overlap with Plavix for at least 6 months.  Continue  Plavix.  - Continue amiodarone given severe symptoms when in atrial fibrillation. Bradycardic at night down to the 40s.  9. CKD: Stage 3.   - Creatinine trending up 1.5>1.8.>1.7     Home today. HF follow up set up. She was able to perform Cardiomems reading.   PAD 20   Medication concerns reviewed with patient and pharmacy team. Barriers identified: none   Length of Stay: 3  Amy Clegg, NP  01/19/2018, 9:53 AM  Advanced Heart Failure Team Pager [phone] (M-F; 7a - 4p)  Please contact [facility] for night-coverage after hours (4p -7a ) and weekends on [email or web address]  Patient seen with NP, agree with the above note.   She is doing well today, PADP from Cardiomems has dropped down to 20 with diuresis.  Breathing is much better.   She can go home today. Will increase home torsemide to 100 mg bid and decrease home Toprol XL to 12.5 mg daily given bradycardia.   Loralie Champagne 01/19/2018 10:53 AM

## 2018-01-20 ENCOUNTER — Ambulatory Visit (HOSPITAL_COMMUNITY): Payer: Medicare Other

## 2018-01-23 ENCOUNTER — Ambulatory Visit (HOSPITAL_COMMUNITY): Payer: Medicare Other

## 2018-01-24 ENCOUNTER — Telehealth (HOSPITAL_COMMUNITY): Payer: Self-pay

## 2018-01-27 ENCOUNTER — Ambulatory Visit (HOSPITAL_COMMUNITY): Payer: Medicare Other

## 2018-01-30 ENCOUNTER — Ambulatory Visit (HOSPITAL_COMMUNITY): Payer: Medicare Other

## 2018-02-03 ENCOUNTER — Ambulatory Visit (HOSPITAL_COMMUNITY): Payer: Medicare Other

## 2018-02-06 ENCOUNTER — Ambulatory Visit (HOSPITAL_COMMUNITY): Payer: Medicare Other

## 2018-02-06 ENCOUNTER — Encounter (HOSPITAL_COMMUNITY): Payer: Self-pay | Admitting: Adult Health

## 2018-02-06 ENCOUNTER — Telehealth (HOSPITAL_COMMUNITY): Payer: Self-pay | Admitting: Adult Health

## 2018-02-06 NOTE — Telephone Encounter (Signed)
   Called and let her know we are receiving good readings.   PAD elevated today. She has had 4-5 pound weight gain. She was instructed to take an extra metolazone tomorrow.   Plan to check BMET next week.   Aaylah Pokorny NP-C  1:29 PM

## 2018-02-07 NOTE — Progress Notes (Signed)
Patient ID: [name] [name], female   DOB: April 05, 1945, 74 y.o.   [name]: [id number] PCP: Dr. Sandi Mariscal Cardiology: Dr. [name]  73 y.o. with history of CAD s/p CABG, diastolic CHF, and cerebrovascular disease with history of CVA presents for followup of CHF and diastolic CHF. She had CABG x 5 in 12/11.  Prior to the CABG she had multiple PCIs.  She had a CVA in 2/14 that presented as visual loss.  She had a left carotid stent in 2/15.   Left heart cath done in Sept. 2014. This showed patent SVG-D, LIMA-LAD, and sequential SVG-OM branches but SVG-RCA and the native RCA were both totally occluded. It was felt aht her increased symptoms coincided with occlusion of SVG-RCA.  She was started on Imdur to see if this would help with the chest pain and dyspnea.  However, she feels like Imdur causes leg cramps and does not think that she can take it.  So ranolazine 500 mg bid started and titrated this up to 1000 mg bid.  This helped some but not markedly.  Therefore, sent to  Dr. Irish Lack to address opening RCA CTO. He was able to do this in 5/15 with 4 overlapping Xience DES.  This led to resolution of exertional chest pain.    [name] [name] was started on Brilinta after PCI.  She became much more short of breath after starting on Brilinta. Per Dr Delano Metz stopped and replaced with Plavix.  Dyspnea improved off Brilinta. Given increasing exertional dyspnea and chest pain,  RHC/LHC was done in 4/17.  This showed stable CAD with no interventional target.  Left and right heart filling pressures were not significantly increased.  Medical management.   She had an MRI/MRA in May 2018 that showed moderately severe stenosis of the supraclinoid segment of the right internal carotid artery, progressed since the 2016 MR angiogram and severe basilar artery stenosis.   She developed recurrent exertional chest pain and had LHC in 6/18, showing 4/5 grafts patent with patent native RCA (similar to prior cath, no changes).     Echo 1/19 with EF 55-60%, moderate diastolic dysfunction, PASP 50 mmHg, mildly dilated RV, mild AS, mild-moderate MR.   She reported increased dyspnea and chest pain and had RHC/LHC in 9/19.  She had DES to sequential SVG-OM1/PLOM.  While hospitalized, she was noted to have runs of atrial fibrillation.  She was very symptomatic with the atrial fibrillation.  Eliquis and amiodarone were started.   She was admitted 12/16-12/19/19 for cardiomems implant and RHC. She was diuresed with IV lasix and transitioned to torsemide 100 mg BID + metolazone once/week. DC weight: 295 lbs.  She returns today for post hospital follow up. She was instructed to take metolazone on Monday and Tuesday this week with elevated PAD on cardiomems reading and 5 lb weight gain. Overall doing okay. Her weight had gone up 8 lbs, but is now back down with the extra metolazone. She is at 292 lbs at home, which was her weight when she left the hospital. She was having problems with cardiomems readings, but now resolved with reading in a different room (unable to measure around CPAP or O2). She is now sitting up for readings. Denies orthopnea or PND. Has some BLE edema that gets worse throughout the day. Unable to tolerate TEDs with neuropathy. She has a little bit of abdominal bloating. No CP or dizziness. Great UOP with torsemide and metolazone. Urine has been malodorous, but no burning. Has frequency due  to torsemide. She starts cardiac rehab later this month. Taking all medications. She has felt more depressed lately and is having a hard time leaving her house. Takes cymbalta, but has been on same dose for a long time.    Labs (6/14): K 3.8, creatinine 0.71 Labs (8/14): BNP 249, LDL 166 Labs (9/14): K 4.7, creatinine 0.7 Labs (9/14): K 4.6, creatinine 0.9, BNP 109 Labs (1/15): K 4.5, creatinine 0.73, LDL 212, HCT 43.4, TSH normal, BNP 138 Labs (6/15): K 4.7, creatinine 1.17, LDL 100, HDL 37, TGs 363, BNP 39 Labs (10/15): K 4.6,  creatinine 1.2 Labs (1/16): LDL 157, HDL 43, K 5.2, creatinine 0.95 Labs (2/16): K 4.5, creatinine 1.08, BNP 113 Labs (4/16): LDL 50, HDL 56, TGs 179 Labs (9/16): K 5.3, creatinine 1.09 Labs (10/16): LDL 75, HDL 56 Labs (2/17): K 5.2, creatinine 1.07 Labs (4/17): K 5.1, creatinine 1.07 Labs (5/17): K 4.5, creatinine 1.01, LDL 96, HDL 56, BNP 70 Labs (03/2016) K 4.9, creatinine 1.07 Labs (7/18): K 4.9, creatinine 1.15 Labs (11/18): K 5.1, creatinine 1.11, LDL 36 Labs (1/19): K 4.9, creatinine 1.22 Labs (9/19): LDL 108 Labs (10/19): hgb 14.5, K 4.3, creatinine 1.03 Labs 12/06/2017: K 4.7 Creatinine 1.7  ECG (personally reviewed): NSR, LBBB  PMH: 1. Diabetic gastroparesis 2. Type II diabetes 3. HTN 4. Morbid obesity 5. CAD: s/p CABG in 12/11 after prior PCIs.  LIMA-LAD, SVG-D, seq SVG-OM1 and OM2, SVG-PDA. Adenosine Cardiolite (8/14) with EF 53% and a small reversible apical defect with a medium, partially reversible inferior defect.  LHC (9/14) with patent SVG-D, LIMA-LAD (50% distal LAD), and sequential SVG-OM branches; the SVG-RCA and the native RCA were occluded.  This was managed medically initially, but with ongoing exertional chest pain, it was decided to open CTO.  Patient had CTO opening with 4 overlapping Xience DES in the RCA in 5/15.   - LHC (4/17): SVG-D patent, LIMA-LAD patent, distal LAD with several 40-50% stenoses, sequential SVG-OM1 and PLOM patent with 50% proximal stenosis (not flow limiting), SVG-RCA TO with patent RCA stents.  - LHC (6/18): 4/5 grafts patent (SVG-RCA TO, same as past), RCA stents patent => no change.  - LHC (9/19): Sequential SVG-PLOM/OM1 with 80% proximal stenosis, s/p DES.  6. Atypical migraines 7. OHS/OSA: Intolerant of CPAP. Uses oxygen with exertion and at night.  8. GERD with hiatal hernia 9. OA 10. Diastolic CHF: Echo (15/17) with EF 50-55%, grade II diastolic dysfunction, mild-moderate MR.  Echo (8/14) with EF 55-60%, grade II diastolic  dysfunction, mildly increased aortic valve gradient (mean 12 mmHg) but valve opens well, mild MR and mild RV dilation.  Echo (9/15) with EF 60-65%, grade II diastolic dysfunction, mild aortic stenosis, mild mitral stenosis, mild to moderate mitral regurgitation, mildly dilated RV with normal systolic function, PA systolic pressure 46 mmHg.  - RHC (4/17): mean RA 9, PA 38/15 mean 27, mean PCWP 9, CI 2.5.  - Echo (5/17): EF 55-60%, mild LVH, mildly dilated RV with low normal systolic function, moderate TR, PASP 61 mmHg - Echo (1/19): EF 55-60%, moderate diastolic dysfunction, PASP 50 mmHg, mildly dilated RV, mild AS, mild-moderate MR. - RHC (9/19): mean RA 11, PA 34/12, mean PCWP 15, CI 2.85 11. CKD 12. Chronic LBBB 13. Anxiety 14. Carotid stenosis: Followed by [facility], >61% LICA stenosis 60/73.  She had left carotid stent in 2/15. Carotid dopplers 8/15 with no significant disease. Carotid dopplers (4/16): < 40% RICA stenosis.  15. Cerebrovascular disease: CVA 2/14 with right posterior cerebral  artery territory ischemic infarction. Cerebral angiogram in 6/14 showed 70% right vertebral ostial stenosis, 91-63% LICA stenosis, > 84% proximal left posterior cerebral artery stenosis, posterior communicating artery aneurysm.  Patient has had episodes of transient expressive aphasia.  Carotid dopplers (66/59) showed >93% LICA stenosis. She had a left carotid stent 2/15.  Possible CVA in 7/15. -  MRI/MRA in May 2018 that showed moderately severe stenosis of the supraclinoid segment of the right internal carotid artery, progressed since the 2016 MR angiogram and severe basilar artery stenosis.  16. Positional vertigo (suspected) 17. Palpitations: Holter (6/15) with rare PVCs/PACs.  - Event monitor (2/18): NSR, occasional PVCs 18. Dyspnea with Brilinta. 19. Melanoma: On face, s/p excision.   20. Aortic stenosis: Mild.  21. Holter (8/16) with no significant arrhythmias.  22. Lower extremity arterial dopplers (9/16)  were normal.  23. Sleep study (4/18): No significant OSA.  24. Atrial fibrillation: Paroxysmal  SH: Married, nonsmoker  FH: CAD  Review of systems complete and found to be negative unless listed in HPI.   Current Outpatient Medications  Medication Sig Dispense Refill  . acetaminophen (TYLENOL) 500 MG tablet Take 1,000 mg by mouth every 6 (six) hours as needed for mild pain or headache.     . albuterol (PROVENTIL) (2.5 MG/3ML) 0.083% nebulizer solution Take 2.5 mg by nebulization every 4 (four) hours as needed for wheezing or shortness of breath.     Marland Kitchen albuterol (PROVENTIL,VENTOLIN) 90 MCG/ACT inhaler Inhale 2 puffs into the lungs every 4 (four) hours as needed for wheezing or shortness of breath.     . Alirocumab (PRALUENT) 150 MG/ML SOPN Inject 1 pen into the skin every 14 (fourteen) days. 6 pen 3  . ALPRAZolam (XANAX) 0.5 MG tablet Take 0.5-1 mg by mouth See admin instructions. Take 0.5 mg by mouth in the morning. Take 0.5 mg by mouth at noon if needed for anxiety. Take 1 mg by mouth at bedtime.    Marland Kitchen amiodarone (PACERONE) 100 MG tablet Take 1 tablet (100 mg total) by mouth daily. 30 tablet 6  . amLODipine (NORVASC) 10 MG tablet Take 10 mg by mouth at bedtime.     Marland Kitchen apixaban (ELIQUIS) 5 MG TABS tablet Take 1 tablet (5 mg total) by mouth 2 (two) times daily. 60 tablet 3  . b complex vitamins capsule Take 1 capsule by mouth daily.    Marland Kitchen buPROPion (WELLBUTRIN XL) 150 MG 24 hr tablet Take 150 mg by mouth daily.    . Calcium Carbonate 1500 (600 CA) MG TABS Take 1,500 mg by mouth daily with breakfast.    . Cholecalciferol (VITAMIN D) 2000 UNITS tablet Take 2,000 Units by mouth daily with breakfast.     . cloNIDine (CATAPRES) 0.2 MG tablet Take 1 tablet (0.2 mg total) by mouth 2 (two) times daily. 60 tablet 11  . clopidogrel (PLAVIX) 75 MG tablet TAKE ONE (1) TABLET EACH DAY (Patient taking differently: Take 75 mg by mouth at bedtime. ) 90 tablet 2  . denosumab (PROLIA) 60 MG/ML SOLN injection  Inject 60 mg into the skin every 6 (six) months. Administer in upper arm, thigh, or abdomen    . DULoxetine (CYMBALTA) 30 MG capsule Take 1 capsule (30 mg total) by mouth 2 (two) times daily. (Patient taking differently: Take 30 mg by mouth daily. ) 180 capsule 3  . ezetimibe (ZETIA) 10 MG tablet TAKE ONE (1) TABLET EACH DAY (Patient taking differently: Take 10 mg by mouth at bedtime. ) 90 tablet 3  .  Fluticasone-Salmeterol (ADVAIR DISKUS) 250-50 MCG/DOSE AEPB Inhale 1 puff into the lungs 2 (two) times daily.     Marland Kitchen gabapentin (NEURONTIN) 600 MG tablet Take 600-1,200 mg by mouth See admin instructions. Take 600 mg by mouth in the morning and take 600 mg by mouth in the afternoon. Take 1200 mg by mouth at bedtime.    Marland Kitchen HUMALOG KWIKPEN 100 UNIT/ML SOPN Inject 18-26 Units into the skin 3 (three) times daily before meals. Per sliding scale    . HYDROcodone-acetaminophen (NORCO/VICODIN) 5-325 MG per tablet Take 1 tablet by mouth at bedtime.     . Insulin Glargine (LANTUS SOLOSTAR) 100 UNIT/ML Solostar Pen Inject 50 Units into the skin at bedtime.     Marland Kitchen ipratropium-albuterol (DUONEB) 0.5-2.5 (3) MG/3ML SOLN Take 3 mLs by nebulization every 4 (four) hours as needed (for severe shortness of breath or wheezing).     Marland Kitchen loperamide (IMODIUM) 2 MG capsule Take 2 mg by mouth as needed for diarrhea or loose stools.    Marland Kitchen losartan (COZAAR) 100 MG tablet Take 100 mg by mouth daily.    . magnesium oxide (MAG-OX) 400 MG tablet Take 400 mg by mouth 2 (two) times daily.    . meclizine (ANTIVERT) 12.5 MG tablet Take 12.5 mg by mouth 3 (three) times daily as needed for dizziness.    . methocarbamol (ROBAXIN) 500 MG tablet TAKE ONE (1) TABLET THREE (3) TIMES EACH DAY (Patient taking differently: Take 500 mg by mouth 3 (three) times daily. ) 60 tablet 4  . metolazone (ZAROXOLYN) 2.5 MG tablet Take 1 tablet (2.5 mg total) by mouth once a week. Take 1 Tablet once weekly on Mondays. (Patient taking differently: Take 2.5 mg by mouth  every Monday. ) 15 tablet 3  . metoprolol succinate (TOPROL-XL) 25 MG 24 hr tablet Take 1 tablet (25 mg total) by mouth daily. 180 tablet 3  . Multiple Vitamin (MULTIVITAMIN) capsule Take 1 capsule by mouth daily.    . multivitamin-lutein (OCUVITE-LUTEIN) CAPS capsule Take 1 capsule by mouth daily.    . nitroGLYCERIN (NITROSTAT) 0.3 MG SL tablet Place 1 tablet (0.3 mg total) under the tongue every 5 (five) minutes x 3 doses as needed for chest pain. 9 tablet 1  . ondansetron (ZOFRAN) 4 MG tablet Take 1 tablet (4 mg total) by mouth 2 (two) times daily as needed. For nausea (Patient taking differently: Take 4 mg by mouth 2 (two) times daily. ) 20 tablet 0  . OXYGEN Inhale 2 L into the lungs at bedtime.     . pantoprazole (PROTONIX) 40 MG tablet Take 1 tablet (40 mg total) by mouth 2 (two) times daily. 180 tablet 0  . Polyvinyl Alcohol-Povidone PF (REFRESH) 1.4-0.6 % SOLN Place 2 drops into both eyes 2 (two) times daily as needed (for dry eyes).    . potassium chloride SA (K-DUR,KLOR-CON) 20 MEQ tablet Take 1 tablet (20 mEq total) by mouth 2 (two) times daily. 60 tablet 1  . RANEXA 1000 MG SR tablet Take 1 tablet (1,000 mg total) by mouth 2 (two) times daily. 180 tablet 3  . rosuvastatin (CRESTOR) 40 MG tablet TAKE ONE (1) TABLET EACH DAY (Patient taking differently: Take 40 mg by mouth at bedtime. ) 90 tablet 6  . torsemide (DEMADEX) 20 MG tablet Take 5 tablets (100 mg total) by mouth 2 (two) times daily. 240 tablet 3  . triamcinolone cream (KENALOG) 0.1 % Apply 1 application topically 2 (two) times daily.     . vitamin  B-12 (CYANOCOBALAMIN) 1000 MCG tablet Take 1,000 mcg by mouth daily.    Marland Kitchen zinc gluconate 50 MG tablet Take 50 mg by mouth daily.     Marland Kitchen zolpidem (AMBIEN) 10 MG tablet Take 10 mg by mouth at bedtime.      No current facility-administered medications for this encounter.     BP 112/74   Pulse (!) 59   Wt 133.6 kg (294 lb 9.6 oz)   SpO2 93%   BMI 53.02 kg/m   Wt Readings from Last  3 Encounters:  02/08/18 133.6 kg (294 lb 9.6 oz)  01/19/18 133.8 kg (295 lb)  01/03/18 (!) 142.2 kg (313 lb 9.6 oz)    PHYSICAL EXAM General: No resp difficulty. HEENT: Normal Neck: Supple. JVP ~8-9. Carotids 2+ bilat; no bruits. No thyromegaly or nodule noted. Cor: PMI nondisplaced. RRR, RUSB 2/6 Lungs: CTAB, normal effort. Abdomen: Soft, non-tender, non-distended, no HSM. No bruits or masses. +BS  Extremities: No cyanosis, clubbing, or rash. R and LLE 1+ ankle edema Neuro: Alert & orientedx3, cranial nerves grossly intact. moves all 4 extremities w/o difficulty. Affect pleasant   Assessment/Plan: 1. CAD: Occluded native RCA and SVG-RCA.  Now status post opening of CTO RCA with 4 overlapping Xience DES in 5/15.  DES to sequential SVG-OM1/PLOM in 9/19.   - She will need to continue Plavix and eliquis   - Continue Plavix hopefully for a year (10/2018)   - She has not tolerated Imdur in the past. Continue ranolazine (provided refill for generic today - her insurance no longer covers brand) and Toprol XL.  - No s/s ischemia - Starts cardiac rehab this month.  2. Chronic diastolic CHF: Echo in 6/96 with EF 55-60%, moderate diastolic dysfunction. Cardiomems placed 01/16/18. - NYHA III. Volume status mildly elevated. Weight is down 8 lbs with metolazone x2 this week. I will not push her more today. BMET today.  - Cardiomems readings 26-27 today. Goal is 21, but she has been doing readings sitting up now. Discussed with Darrick Grinder, NP and will trend her numbers for now. May need to adjust goal due to different positioning.  - Continue torsemide 100 mg BID + metolazone 2.5 mg once/week.  She takes 20 meq K BID. BMET today.  3. Hyperlipidemia: She remains on Zetia, Crestor and Praluent.  Lipids mildly higher than goal in 9/19 with LDL 108, she is on maximal meds at this time. No change.    4. Hypertension: Stable. 5. Cerebrovascular disease: She has history of CVA and had left carotid stent in  2/15. Followed by [facility].  Most recent MRA head showed severe stenosis of supraclinoid RICA and severe basilar artery stenosis. No change. 6. Palpitations: Resolved. Continue Toprol XL 25 mg daily.  Event monitor in 2/18 with few PVCs.  7. OHS/OSA: Cannot tolerate CPAP.  Uses oxygen with exertion and at night. No change. 8. Atrial fibrillation: Paroxysmal, symptomatic.   - Continue amiodarone 100 mg daily.   - Continue apixaban 5 mg bid. Denies bleeding. - Recent TSH, CMET stable  (01/2018). She will need yearly eye exams.  9. Obesity Body mass index is 53.02 kg/m. Discussed portion control. Needs to lose weight.  10. Depression - On Cymbalts, but has been feeling more depressed lately. Having a hard time leaving the house. Her husband also got diagnosed with prostate cancer last month. Encouraged her to follow up with PCP about adjusting antidepressants. Hopefully cardiac rehab will help as well.  11. Malodorous Urine - No burning. She  urinates frequently due to diuretics. Check CBC today. If WBC up, will forward to PCP. She will follow up with PCP if symptoms continue.   BMET, CBC today. Refills provided. Follow up in 3 weeks on APP side. She has f/u with Dr [name] in 6 weeks.   Georgiana Shore NP-C  02/08/2018   Greater than 50% of the 25 minute visit was spent in counseling/coordination of care regarding disease state education, salt/fluid restriction, sliding scale diuretics, and medication compliance.

## 2018-02-08 ENCOUNTER — Ambulatory Visit (HOSPITAL_COMMUNITY)
Admission: RE | Admit: 2018-02-08 | Discharge: 2018-02-08 | Disposition: A | Discharge status: Home / Self Care | Payer: Medicare Other | Source: Ambulatory Visit | Attending: Internal Medicine | Admitting: Internal Medicine

## 2018-02-08 ENCOUNTER — Inpatient Hospital Stay (HOSPITAL_COMMUNITY): Admit: 2018-02-08 | Payer: Medicare Other

## 2018-02-08 ENCOUNTER — Ambulatory Visit (HOSPITAL_COMMUNITY): Payer: Medicare Other

## 2018-02-08 ENCOUNTER — Inpatient Hospital Stay (HOSPITAL_COMMUNITY): Payer: Medicare Other

## 2018-02-08 ENCOUNTER — Encounter (HOSPITAL_COMMUNITY): Payer: Self-pay

## 2018-02-08 VITALS — BP 112/74 | HR 59 | Wt 294.6 lb

## 2018-02-08 DIAGNOSIS — I2510 Atherosclerotic heart disease of native coronary artery without angina pectoris: Secondary | ICD-10-CM | POA: Diagnosis not present

## 2018-02-08 DIAGNOSIS — I480 Paroxysmal atrial fibrillation: Secondary | ICD-10-CM

## 2018-02-08 DIAGNOSIS — R82998 Other abnormal findings in urine: Secondary | ICD-10-CM | POA: Diagnosis not present

## 2018-02-08 DIAGNOSIS — Z794 Long term (current) use of insulin: Secondary | ICD-10-CM | POA: Insufficient documentation

## 2018-02-08 DIAGNOSIS — F32A Depression, unspecified: Secondary | ICD-10-CM

## 2018-02-08 DIAGNOSIS — F329 Major depressive disorder, single episode, unspecified: Secondary | ICD-10-CM | POA: Diagnosis not present

## 2018-02-08 DIAGNOSIS — I25119 Atherosclerotic heart disease of native coronary artery with unspecified angina pectoris: Secondary | ICD-10-CM

## 2018-02-08 DIAGNOSIS — F419 Anxiety disorder, unspecified: Secondary | ICD-10-CM | POA: Diagnosis not present

## 2018-02-08 DIAGNOSIS — Z85820 Personal history of malignant melanoma of skin: Secondary | ICD-10-CM | POA: Insufficient documentation

## 2018-02-08 DIAGNOSIS — Z8673 Personal history of transient ischemic attack (TIA), and cerebral infarction without residual deficits: Secondary | ICD-10-CM | POA: Diagnosis not present

## 2018-02-08 DIAGNOSIS — R002 Palpitations: Secondary | ICD-10-CM | POA: Diagnosis not present

## 2018-02-08 DIAGNOSIS — E1143 Type 2 diabetes mellitus with diabetic autonomic (poly)neuropathy: Secondary | ICD-10-CM | POA: Insufficient documentation

## 2018-02-08 DIAGNOSIS — Z8249 Family history of ischemic heart disease and other diseases of the circulatory system: Secondary | ICD-10-CM | POA: Diagnosis not present

## 2018-02-08 DIAGNOSIS — G4733 Obstructive sleep apnea (adult) (pediatric): Secondary | ICD-10-CM | POA: Diagnosis not present

## 2018-02-08 DIAGNOSIS — I130 Hypertensive heart and chronic kidney disease with heart failure and stage 1 through stage 4 chronic kidney disease, or unspecified chronic kidney disease: Secondary | ICD-10-CM | POA: Insufficient documentation

## 2018-02-08 DIAGNOSIS — E785 Hyperlipidemia, unspecified: Secondary | ICD-10-CM

## 2018-02-08 DIAGNOSIS — K219 Gastro-esophageal reflux disease without esophagitis: Secondary | ICD-10-CM | POA: Insufficient documentation

## 2018-02-08 DIAGNOSIS — I5032 Chronic diastolic (congestive) heart failure: Secondary | ICD-10-CM

## 2018-02-08 DIAGNOSIS — Z951 Presence of aortocoronary bypass graft: Secondary | ICD-10-CM | POA: Diagnosis not present

## 2018-02-08 DIAGNOSIS — Z955 Presence of coronary angioplasty implant and graft: Secondary | ICD-10-CM | POA: Insufficient documentation

## 2018-02-08 DIAGNOSIS — N189 Chronic kidney disease, unspecified: Secondary | ICD-10-CM | POA: Insufficient documentation

## 2018-02-08 DIAGNOSIS — I350 Nonrheumatic aortic (valve) stenosis: Secondary | ICD-10-CM | POA: Diagnosis not present

## 2018-02-08 DIAGNOSIS — E1122 Type 2 diabetes mellitus with diabetic chronic kidney disease: Secondary | ICD-10-CM | POA: Insufficient documentation

## 2018-02-08 DIAGNOSIS — Z79899 Other long term (current) drug therapy: Secondary | ICD-10-CM | POA: Insufficient documentation

## 2018-02-08 DIAGNOSIS — Z6841 Body Mass Index (BMI) 40.0 and over, adult: Secondary | ICD-10-CM | POA: Diagnosis not present

## 2018-02-08 DIAGNOSIS — K449 Diaphragmatic hernia without obstruction or gangrene: Secondary | ICD-10-CM | POA: Insufficient documentation

## 2018-02-08 DIAGNOSIS — Z7902 Long term (current) use of antithrombotics/antiplatelets: Secondary | ICD-10-CM | POA: Diagnosis not present

## 2018-02-08 DIAGNOSIS — R8290 Unspecified abnormal findings in urine: Secondary | ICD-10-CM

## 2018-02-08 DIAGNOSIS — I10 Essential (primary) hypertension: Secondary | ICD-10-CM | POA: Diagnosis not present

## 2018-02-08 DIAGNOSIS — Z7901 Long term (current) use of anticoagulants: Secondary | ICD-10-CM | POA: Diagnosis not present

## 2018-02-08 LAB — BASIC METABOLIC PANEL
Anion gap: 11 (ref 5–15)
BUN: 25 mg/dL — ABNORMAL HIGH (ref 8–23)
CO2: 26 mmol/L (ref 22–32)
Calcium: 9 mg/dL (ref 8.9–10.3)
Chloride: 104 mmol/L (ref 98–111)
Creatinine, Ser: 1.9 mg/dL — ABNORMAL HIGH (ref 0.44–1.00)
GFR calc Af Amer: 30 mL/min — ABNORMAL LOW (ref >60)
GFR calc non Af Amer: 26 mL/min — ABNORMAL LOW (ref >60)
Glucose, Bld: 133 mg/dL — ABNORMAL HIGH (ref 70–99)
Potassium: 4.5 mmol/L (ref 3.5–5.1)
Sodium: 141 mmol/L (ref 135–145)

## 2018-02-08 LAB — CBC
HCT: 41.2 % (ref 36.0–46.0)
Hemoglobin: 12.9 g/dL (ref 12.0–15.0)
MCH: 32.4 pg (ref 26.0–34.0)
MCHC: 31.3 g/dL (ref 30.0–36.0)
MCV: 103.5 fL — ABNORMAL HIGH (ref 80.0–100.0)
Platelets: 192 10*3/uL (ref 150–400)
RBC: 3.98 MIL/uL (ref 3.87–5.11)
RDW: 13.7 % (ref 11.5–15.5)
WBC: 10.1 10*3/uL (ref 4.0–10.5)
nRBC: 0 % (ref 0.0–0.2)

## 2018-02-08 MED ORDER — RANOLAZINE ER 1000 MG PO TB12: 1000.0000 mg | ORAL_TABLET | Freq: Two times a day (BID) | ORAL | 3 refills | Status: DC

## 2018-02-08 MED ORDER — AMIODARONE HCL 100 MG PO TABS: 100.0000 mg | ORAL_TABLET | Freq: Every day | ORAL | 6 refills | Status: DC

## 2018-02-08 MED ORDER — APIXABAN 5 MG PO TABS: 5.0000 mg | ORAL_TABLET | Freq: Two times a day (BID) | ORAL | 6 refills | Status: DC

## 2018-02-08 NOTE — Patient Instructions (Addendum)
Refills have been sent to your pharmacy for Eliquis, Amiodarone, and Ranexa.  Labs today We will only contact you if something comes back abnormal or we need to make some changes. Otherwise no news is good news!  Your physician recommends that you schedule a follow-up appointment in: 3 weeks in NP/PA clinic  Do the following things EVERYDAY: 1) Weigh yourself in the morning before breakfast. Write it down and keep it in a log. 2) Take your medicines as prescribed 3) Eat low salt foods-Limit salt (sodium) to 2000 mg per day.  4) Stay as active as you can everyday 5) Limit all fluids for the day to less than 2 liters

## 2018-02-09 ENCOUNTER — Other Ambulatory Visit: Payer: Self-pay | Admitting: Internal Medicine

## 2018-02-10 ENCOUNTER — Ambulatory Visit (HOSPITAL_COMMUNITY): Payer: Medicare Other

## 2018-02-13 ENCOUNTER — Ambulatory Visit (HOSPITAL_COMMUNITY): Payer: Medicare Other

## 2018-02-15 ENCOUNTER — Ambulatory Visit (HOSPITAL_COMMUNITY): Payer: Medicare Other

## 2018-02-17 ENCOUNTER — Ambulatory Visit (HOSPITAL_COMMUNITY): Payer: Medicare Other

## 2018-02-20 ENCOUNTER — Ambulatory Visit (HOSPITAL_COMMUNITY): Payer: Medicare Other

## 2018-02-21 ENCOUNTER — Telehealth (HOSPITAL_COMMUNITY): Payer: Self-pay | Admitting: *Deleted

## 2018-02-21 NOTE — Progress Notes (Signed)
[name] [name] 73 y.o. female DOB: 1945/09/03 [name]: [id number]      Nutrition Note  No diagnosis found. Past Medical History:  Diagnosis Date  . Abnormality of gait 05/14/2014  . Anemia    hx  . Anginal pain ([place])   . Anxiety   . Asthma   . Basal cell carcinoma 05/2014   "left shoulder"  . Bundle branch block, left    chronic/notes 07/18/2013  . CHF (congestive heart failure) ([place])   . Chronic bronchitis ([place])    "off and on all the time" (07/18/2013)  . Chronic insomnia 05/06/2015  . Chronic kidney disease    frequency, sees dr Jamal Maes every 4 to 6 months (01/16/2018)  . Chronic low back pain 08/24/2016  . Chronic lower back pain   . Claustrophobia   . Common migraine 05/14/2014  . Coronary artery disease   . Depression   . Dysrhythmia   . GERD (gastroesophageal reflux disease)   . H/O hiatal hernia   . Headache    "at least 2/month" (01/16/2018)  . Heart murmur   . Hyperlipidemia   . Hypertension   . Irregular heart beat   . Leg cramps    both legs at times  . Melanoma ([place]) 05/2014   "burned off BLE" (01/16/2018)  . Memory change 05/14/2014  . Migraine    "5-6/year"  (01/16/2018)  . Myocardial infarction ([place]) 04/1999, 02/2000, 01/2005; 2011; 2014  . Neuromuscular disorder ([place])    ?  . Obesity 01-2010  . Obstructive sleep apnea    "can't wear machine; I have claustrophobia" (01/16/2018)  . On home oxygen therapy    "2L at night and prn during daytime" (01/16/2018)  . Osteoarthritis    "knees and hands" (01/16/2018)  . Other and unspecified angina pectoris   . Peripheral vascular disease ([facility])    ? numbness, tingling arms and legs  . Pneumonia 2000's   "once"  . PONV (postoperative nausea and vomiting)   . Shortness of breath    with exertion  . Stroke ([place]) 03/22/12   right side brain; denies residual on 07/18/2013)  . Stroke [facility]) Oct. 2015   Affected pt.s balance  . Stroke [facility]) 10/2014   "affected my legs; fully recovered now"; still have sporatic  memory issues from this one" (01/16/2018)  . Type II diabetes mellitus ([place])   . Ventral hernia    hx of  . Vomiting    persistent  . Vomiting blood    Meds reviewed.   Current Outpatient Medications (Endocrine & Metabolic):  .  denosumab (PROLIA) 60 MG/ML SOLN injection, Inject 60 mg into the skin every 6 (six) months. Administer in upper arm, thigh, or abdomen .  HUMALOG KWIKPEN 100 UNIT/ML SOPN, Inject 18-26 Units into the skin 3 (three) times daily before meals. Per sliding scale .  Insulin Glargine (LANTUS SOLOSTAR) 100 UNIT/ML Solostar Pen, Inject 50 Units into the skin at bedtime.   Current Outpatient Medications (Cardiovascular):  Marland Kitchen  Alirocumab (PRALUENT) 150 MG/ML SOPN, Inject 1 pen into the skin every 14 (fourteen) days. Marland Kitchen  amiodarone (PACERONE) 100 MG tablet, Take 1 tablet (100 mg total) by mouth daily. Marland Kitchen  amLODipine (NORVASC) 10 MG tablet, Take 10 mg by mouth at bedtime.  .  cloNIDine (CATAPRES) 0.2 MG tablet, Take 1 tablet (0.2 mg total) by mouth 2 (two) times daily. Marland Kitchen  ezetimibe (ZETIA) 10 MG tablet, TAKE ONE (1) TABLET EACH DAY (Patient taking differently: Take 10 mg by mouth  at bedtime. ) .  losartan (COZAAR) 100 MG tablet, Take 100 mg by mouth daily. .  metolazone (ZAROXOLYN) 2.5 MG tablet, Take 1 tablet (2.5 mg total) by mouth once a week. Take 1 Tablet once weekly on Mondays. (Patient taking differently: Take 2.5 mg by mouth every Monday. ) .  metoprolol succinate (TOPROL-XL) 25 MG 24 hr tablet, Take 1 tablet (25 mg total) by mouth daily. .  nitroGLYCERIN (NITROSTAT) 0.3 MG SL tablet, Place 1 tablet (0.3 mg total) under the tongue every 5 (five) minutes x 3 doses as needed for chest pain. .  ranolazine (RANEXA) 1000 MG SR tablet, Take 1 tablet (1,000 mg total) by mouth 2 (two) times daily. .  rosuvastatin (CRESTOR) 40 MG tablet, TAKE ONE (1) TABLET EACH DAY (Patient taking differently: Take 40 mg by mouth at bedtime. ) .  torsemide (DEMADEX) 20 MG tablet, Take 5 tablets  (100 mg total) by mouth 2 (two) times daily.  Current Outpatient Medications (Respiratory):  .  albuterol (PROVENTIL) (2.5 MG/3ML) 0.083% nebulizer solution, Take 2.5 mg by nebulization every 4 (four) hours as needed for wheezing or shortness of breath.  Marland Kitchen  albuterol (PROVENTIL,VENTOLIN) 90 MCG/ACT inhaler, Inhale 2 puffs into the lungs every 4 (four) hours as needed for wheezing or shortness of breath.  .  Fluticasone-Salmeterol (ADVAIR DISKUS) 250-50 MCG/DOSE AEPB, Inhale 1 puff into the lungs 2 (two) times daily.  Marland Kitchen  ipratropium-albuterol (DUONEB) 0.5-2.5 (3) MG/3ML SOLN, Take 3 mLs by nebulization every 4 (four) hours as needed (for severe shortness of breath or wheezing).   Current Outpatient Medications (Analgesics):  .  acetaminophen (TYLENOL) 500 MG tablet, Take 1,000 mg by mouth every 6 (six) hours as needed for mild pain or headache.  Marland Kitchen  HYDROcodone-acetaminophen (NORCO/VICODIN) 5-325 MG per tablet, Take 1 tablet by mouth at bedtime.   Current Outpatient Medications (Hematological):  .  apixaban (ELIQUIS) 5 MG TABS tablet, Take 1 tablet (5 mg total) by mouth 2 (two) times daily. .  clopidogrel (PLAVIX) 75 MG tablet, TAKE ONE (1) TABLET BY MOUTH EVERY DAY .  vitamin B-12 (CYANOCOBALAMIN) 1000 MCG tablet, Take 1,000 mcg by mouth daily.  Current Outpatient Medications (Other):  Marland Kitchen  ALPRAZolam (XANAX) 0.5 MG tablet, Take 0.5-1 mg by mouth See admin instructions. Take 0.5 mg by mouth in the morning. Take 0.5 mg by mouth at noon if needed for anxiety. Take 1 mg by mouth at bedtime. Marland Kitchen  b complex vitamins capsule, Take 1 capsule by mouth daily. Marland Kitchen  buPROPion (WELLBUTRIN XL) 150 MG 24 hr tablet, Take 150 mg by mouth daily. .  Calcium Carbonate 1500 (600 CA) MG TABS, Take 1,500 mg by mouth daily with breakfast. .  Cholecalciferol (VITAMIN D) 2000 UNITS tablet, Take 2,000 Units by mouth daily with breakfast.  .  DULoxetine (CYMBALTA) 30 MG capsule, Take 1 capsule (30 mg total) by mouth 2 (two)  times daily. (Patient taking differently: Take 30 mg by mouth daily. ) .  gabapentin (NEURONTIN) 600 MG tablet, Take 600-1,200 mg by mouth See admin instructions. Take 600 mg by mouth in the morning and take 600 mg by mouth in the afternoon. Take 1200 mg by mouth at bedtime. Marland Kitchen  loperamide (IMODIUM) 2 MG capsule, Take 2 mg by mouth as needed for diarrhea or loose stools. .  magnesium oxide (MAG-OX) 400 MG tablet, Take 400 mg by mouth 2 (two) times daily. .  meclizine (ANTIVERT) 12.5 MG tablet, Take 12.5 mg by mouth 3 (three) times daily  as needed for dizziness. .  methocarbamol (ROBAXIN) 500 MG tablet, TAKE ONE (1) TABLET THREE (3) TIMES EACH DAY (Patient taking differently: Take 500 mg by mouth 3 (three) times daily. ) .  Multiple Vitamin (MULTIVITAMIN) capsule, Take 1 capsule by mouth daily. .  multivitamin-lutein (OCUVITE-LUTEIN) CAPS capsule, Take 1 capsule by mouth daily. .  ondansetron (ZOFRAN) 4 MG tablet, Take 1 tablet (4 mg total) by mouth 2 (two) times daily as needed. For nausea (Patient taking differently: Take 4 mg by mouth 2 (two) times daily. ) .  OXYGEN, Inhale 2 L into the lungs at bedtime.  .  pantoprazole (PROTONIX) 40 MG tablet, Take 1 tablet (40 mg total) by mouth 2 (two) times daily. .  Polyvinyl Alcohol-Povidone PF (REFRESH) 1.4-0.6 % SOLN, Place 2 drops into both eyes 2 (two) times daily as needed (for dry eyes). .  potassium chloride SA (K-DUR,KLOR-CON) 20 MEQ tablet, Take 1 tablet (20 mEq total) by mouth 2 (two) times daily. Marland Kitchen  triamcinolone cream (KENALOG) 0.1 %, Apply 1 application topically 2 (two) times daily.  Marland Kitchen  zinc gluconate 50 MG tablet, Take 50 mg by mouth daily.  Marland Kitchen  zolpidem (AMBIEN) 10 MG tablet, Take 10 mg by mouth at bedtime.    HT: Ht Readings from Last 1 Encounters:  01/16/18 5' 2.5" (1.588 m)    WT: Wt Readings from Last 5 Encounters:  02/08/18 294 lb 9.6 oz (133.6 kg)  01/19/18 295 lb (133.8 kg)  01/03/18 (!) 313 lb 9.6 oz (142.2 kg)  11/22/17 292  lb 9.6 oz (132.7 kg)  11/03/17 292 lb (132.5 kg)     BMI = 53.24  Current tobacco use? No  Labs:  Lipid Panel     Component Value Date/Time   CHOL 181 10/21/2017 0903   CHOL 164 11/13/2014 1151   TRIG 157 (H) 10/21/2017 0903   HDL 42 10/21/2017 0903   HDL 56 11/13/2014 1151   CHOLHDL 4.3 10/21/2017 0903   VLDL 31 10/21/2017 0903   LDLCALC 108 (H) 10/21/2017 0903   LDLCALC 75 11/13/2014 1151   LDLDIRECT 156.7 02/06/2014 1014    Lab Results  Component Value Date   HGBA1C 8.0 (H) 10/21/2017   CBG (last 3)  No results for input(s): GLUCAP in the last 72 hours.  Nutrition Diagnosis ? Food-and nutrition-related knowledge deficit related to lack of exposure to information as related to diagnosis of: ? CVD ? Type 2 Diabetes ? Obese  III = 40+ related to excessive energy intake as evidenced by a BMI = 53.24  Nutrition Intervention ? Pt's individual nutrition plan and goals reviewed with pt. ? Pt given handouts for: ? Nutrition I class ? Nutrition II class  ? Diabetes Blitz Class ? Diabetes Q & A class  ? Consistent vit K diet ? low sodium ? DM ? pre-diabetes  Nutrition Goal(s):  ? To be determined   Plan:  ? Pt to attend nutrition classes ? Nutrition I ? Nutrition II ? Portion Distortion  ? Diabetes Blitz ? Diabetes Q & Ae determined ? Will provide client-centered nutrition education as part of interdisciplinary care ? Monitor and evaluate progress toward nutrition goal with team.   Laurina Bustle, MS, RD, LDN 02/21/2018 8:29 PM

## 2018-02-22 ENCOUNTER — Telehealth (HOSPITAL_COMMUNITY): Payer: Self-pay | Admitting: Pharmacist

## 2018-02-22 ENCOUNTER — Ambulatory Visit (HOSPITAL_COMMUNITY): Payer: Medicare Other

## 2018-02-22 NOTE — Telephone Encounter (Signed)
Cardiac Rehab Medication Review by a Pharmacist  Does the patient  feel that his/her medications are working for him/her?  yes  Has the patient been experiencing any side effects to the medications prescribed?  no  Does the patient measure his/her own blood pressure or blood glucose at home?  yes for glucose; no for blood pressure; got a pulse ox for oxygen levels.   Does the patient have any problems obtaining medications due to transportation or finances?   No but the insurance company is passing on expensive copays to her.  Understanding of regimen: good Understanding of indications: good Potential of compliance: good    Pharmacist comments: Patient has been sick to her stomach. Had bouts of nausea since she left the hospital in December. Patient spoke about having to use albuterol inhaler more often, she asked if she could use it more frequently than every 4 hours. I told her that she could if she needed to, it may have a side effect of increased heart rate but if she was having to use the inhaler often, that she should contact her lung doctor and let them know. She does have a pulse-ox monitor and I stated to her that if she was feeling she was having an asthma issue where she needed to utilize the inhaler more often to monitor her oxygen levels during that and continue to use the inhaler but also to seek emergency help if her levels got too low.  Thank you for allowing pharmacy to be a part of this patient's care.  Tamela Gammon, PharmD 02/22/2018 5:27 PM PGY-1 Pharmacy Resident Direct Phone: [phone] Please check [email or web address] for unit-specific pharmacist phone numbers

## 2018-02-23 ENCOUNTER — Inpatient Hospital Stay (HOSPITAL_COMMUNITY)
Admission: RE | Admit: 2018-02-23 | Discharge: 2018-02-23 | Disposition: A | Discharge status: Home / Self Care | Payer: Medicare Other | Source: Ambulatory Visit

## 2018-02-23 ENCOUNTER — Telehealth (HOSPITAL_COMMUNITY): Payer: Self-pay | Admitting: Family Medicine

## 2018-02-24 ENCOUNTER — Ambulatory Visit (HOSPITAL_COMMUNITY): Payer: Medicare Other

## 2018-02-27 ENCOUNTER — Ambulatory Visit (HOSPITAL_COMMUNITY): Payer: Medicare Other

## 2018-03-01 ENCOUNTER — Ambulatory Visit (HOSPITAL_COMMUNITY): Payer: Medicare Other

## 2018-03-01 ENCOUNTER — Encounter (HOSPITAL_COMMUNITY): Payer: Medicare Other

## 2018-03-03 ENCOUNTER — Ambulatory Visit (HOSPITAL_COMMUNITY): Payer: Medicare Other

## 2018-03-06 ENCOUNTER — Ambulatory Visit (HOSPITAL_COMMUNITY): Payer: Medicare Other

## 2018-03-08 ENCOUNTER — Ambulatory Visit (HOSPITAL_COMMUNITY): Payer: Medicare Other

## 2018-03-08 ENCOUNTER — Ambulatory Visit: Payer: Medicare Other | Admitting: Adult Health

## 2018-03-09 ENCOUNTER — Ambulatory Visit: Payer: Medicare Other | Admitting: Adult Health

## 2018-03-10 ENCOUNTER — Ambulatory Visit (HOSPITAL_COMMUNITY): Payer: Medicare Other

## 2018-03-13 ENCOUNTER — Ambulatory Visit (HOSPITAL_COMMUNITY): Payer: Medicare Other

## 2018-03-13 ENCOUNTER — Ambulatory Visit: Payer: Medicare Other | Admitting: Adult Health

## 2018-03-15 ENCOUNTER — Ambulatory Visit (HOSPITAL_COMMUNITY): Payer: Medicare Other

## 2018-03-15 ENCOUNTER — Ambulatory Visit: Payer: Medicare Other | Admitting: Neurology

## 2018-03-15 ENCOUNTER — Telehealth: Payer: Self-pay | Admitting: Neurology

## 2018-03-15 NOTE — Telephone Encounter (Signed)
This patient canceled the same day of a revisit appointment, this is her second no-show.

## 2018-03-16 ENCOUNTER — Ambulatory Visit: Payer: Medicare Other | Admitting: Adult Health

## 2018-03-16 ENCOUNTER — Encounter: Payer: Self-pay | Admitting: Neurology

## 2018-03-17 ENCOUNTER — Ambulatory Visit (HOSPITAL_COMMUNITY): Payer: Medicare Other

## 2018-03-17 ENCOUNTER — Encounter (HOSPITAL_COMMUNITY): Payer: Medicare Other

## 2018-03-20 ENCOUNTER — Ambulatory Visit (HOSPITAL_COMMUNITY): Payer: Medicare Other

## 2018-03-20 ENCOUNTER — Encounter: Payer: Self-pay | Admitting: Adult Health

## 2018-03-20 ENCOUNTER — Ambulatory Visit (INDEPENDENT_AMBULATORY_CARE_PROVIDER_SITE_OTHER): Payer: Medicare Other | Admitting: Adult Health

## 2018-03-20 VITALS — BP 118/62 | HR 53 | Ht 62.5 in | Wt 315.0 lb

## 2018-03-20 DIAGNOSIS — R269 Unspecified abnormalities of gait and mobility: Secondary | ICD-10-CM

## 2018-03-20 DIAGNOSIS — I5032 Chronic diastolic (congestive) heart failure: Secondary | ICD-10-CM

## 2018-03-20 DIAGNOSIS — R0602 Shortness of breath: Secondary | ICD-10-CM | POA: Diagnosis not present

## 2018-03-20 DIAGNOSIS — G4734 Idiopathic sleep related nonobstructive alveolar hypoventilation: Secondary | ICD-10-CM

## 2018-03-20 DIAGNOSIS — J4530 Mild persistent asthma, uncomplicated: Secondary | ICD-10-CM | POA: Diagnosis not present

## 2018-03-20 DIAGNOSIS — I25119 Atherosclerotic heart disease of native coronary artery with unspecified angina pectoris: Secondary | ICD-10-CM | POA: Diagnosis not present

## 2018-03-20 LAB — BASIC METABOLIC PANEL
BUN: 26 mg/dL — ABNORMAL HIGH (ref 6–23)
CO2: 31 mEq/L (ref 19–32)
Calcium: 9.1 mg/dL (ref 8.4–10.5)
Chloride: 100 mEq/L (ref 96–112)
Creatinine, Ser: 1.73 mg/dL — ABNORMAL HIGH (ref 0.40–1.20)
GFR: 29.1 mL/min — ABNORMAL LOW (ref >60.00)
Glucose, Bld: 106 mg/dL — ABNORMAL HIGH (ref 70–99)
Potassium: 4.3 mEq/L (ref 3.5–5.1)
Sodium: 142 mEq/L (ref 135–145)

## 2018-03-20 NOTE — Progress Notes (Signed)
@Patient  ID: [name], female    DOB: [birth year], 73 y.o.   [name]: [id number]  Chief Complaint  Patient presents with  . Follow-up    asthma     Referring provider: Derinda Late, MD  HPI: 73 year old female never smoker followed for asthma and nocturnal hypoxemia on oxygen at bedtime Past medical history significant for ischemic cardiomyopathy and chronic diastolic heart failure, CVA, CAD s/p CABG , IDDM  Previous sleep study in 2006 showed moderate sleep apnea with CPAP intolerance.  A repeat sleep study and April 2018 was negative for sleep apnea but nocturnal hypoxemia started on oxygen 2 L.   Significant tests/ events NPSG 2006: AHI 19/hr. PSG 05/28/16 no OSA  Echo 06/2015- EF 55-60%, RVSP 61 mm Right heart cath 05/2015 PCWP 9  Spirometry 06/2016 moderate restriction, ratio 83, FEV1 53%, FVC49%  Chest x-ray 07/2017 lungs are clear  03/20/2018 Follow up : Asthma , O2 RF on O2 At bedtime  , Diastolic CHF  Patient returns for a 75-month follow-up.  Patient has mild persistent asthma.  She is on Advair twice daily.  Says overall her breathing is doing the same, gets short of breath with minimal activity . Becoming harder and harder to do things due to shortness of breath and weakness. Having more swelling in legs, weight is trending up . Denies any flare of cough or wheezing.. no congestion or fever.  Activity level is doing down. Hard to walk around without assistance .  Could not participate in cardiac rehab. .   She follows with cardiology [facility] clinic . Weight is up 20lbs over last month, increased leg swelling . She is suppose to be on Demadex 100mg  Twice daily  . She is taking 80mg  Twice daily  . She uses zaroxlyn 2.5mg  weekly . She is also participatingin [facility] program but has not heard from them lately.  Does not weigh daily .    Patient remains on oxygen 2 L at bedtime.  Says overall she feels she is benefiting from this.   Allergies  Allergen Reactions   . Amoxicillin Shortness Of Breath and Rash  . Brilinta [Ticagrelor] Shortness Of Breath  . Erythromycin Shortness Of Breath, Other (See Comments) and Hives    Trouble swallowing  . Flagyl [Metronidazole] Palpitations and Shortness Of Breath  . Penicillins Hives, Shortness Of Breath, Rash and Other (See Comments)    Has patient had a PCN reaction causing immediate rash, facial/tongue/throat swelling, SOB or lightheadedness with hypotension: Yes Has patient had a PCN reaction causing severe rash involving mucus membranes or skin necrosis: No Has patient had a PCN reaction that required hospitalization: Yes Has patient had a PCN reaction occurring within the last 10 years: No If all of the above answers are "NO", then may proceed with Cephalosporin use.   . Isosorbide Mononitrate [Isosorbide Nitrate] Other (See Comments)    Joint aches, muscles hurt, difficult to walk   . Jardiance [Empagliflozin] Other (See Comments)    Nausea, joint aches, muscles aches  . Metformin And Related Other (See Comments)    Stomach pain, cold sweats, joint pain, burred vision, dizziness  . Tape Other (See Comments)    Skin pulls off with certain types Plastic tape causes skin to rip if left on for long periods of time  . Erythromycin Base Rash    Immunization History  Administered Date(s) Administered  . DTaP 03/27/2014  . Influenza Split 12/07/2011  . Influenza Whole 01/02/2000  . Influenza, High Dose Seasonal  PF 12/02/2016, 12/06/2017  . Influenza-Unspecified 12/02/2012, 12/07/2014, 12/02/2015  . Pneumococcal Conjugate-13 02/07/2012, 01/01/2013  . Tdap 12/30/2010, 03/27/2014  . Zoster 07/23/2010    Past Medical History:  Diagnosis Date  . Abnormality of gait 05/14/2014  . Anemia    hx  . Anginal pain ([place])   . Anxiety   . Asthma   . Basal cell carcinoma 05/2014   "left shoulder"  . Bundle branch block, left    chronic/notes 07/18/2013  . CHF (congestive heart failure) ([place])   . Chronic  bronchitis ([place])    "off and on all the time" (07/18/2013)  . Chronic insomnia 05/06/2015  . Chronic kidney disease    frequency, sees dr Jamal Maes every 4 to 6 months (01/16/2018)  . Chronic low back pain 08/24/2016  . Chronic lower back pain   . Claustrophobia   . Common migraine 05/14/2014  . Coronary artery disease   . Depression   . Dysrhythmia   . GERD (gastroesophageal reflux disease)   . H/O hiatal hernia   . Headache    "at least 2/month" (01/16/2018)  . Heart murmur   . Hyperlipidemia   . Hypertension   . Irregular heart beat   . Leg cramps    both legs at times  . Melanoma ([place]) 05/2014   "burned off BLE" (01/16/2018)  . Memory change 05/14/2014  . Migraine    "5-6/year"  (01/16/2018)  . Myocardial infarction ([place]) 04/1999, 02/2000, 01/2005; 2011; 2014  . Neuromuscular disorder ([place])    ?  . Obesity 01-2010  . Obstructive sleep apnea    "can't wear machine; I have claustrophobia" (01/16/2018)  . On home oxygen therapy    "2L at night and prn during daytime" (01/16/2018)  . Osteoarthritis    "knees and hands" (01/16/2018)  . Other and unspecified angina pectoris   . Peripheral vascular disease ([facility])    ? numbness, tingling arms and legs  . Pneumonia 2000's   "once"  . PONV (postoperative nausea and vomiting)   . Shortness of breath    with exertion  . Stroke ([place]) 03/22/12   right side brain; denies residual on 07/18/2013)  . Stroke [facility]) Oct. 2015   Affected pt.s balance  . Stroke [facility]) 10/2014   "affected my legs; fully recovered now"; still have sporatic memory issues from this one" (01/16/2018)  . Type II diabetes mellitus ([place])   . Ventral hernia    hx of  . Vomiting    persistent  . Vomiting blood     Tobacco History: Social History   Tobacco Use  Smoking Status Never Smoker  Smokeless Tobacco Never Used   Counseling given: Not Answered   Outpatient Medications Prior to Visit  Medication Sig Dispense Refill  . acetaminophen (TYLENOL)  500 MG tablet Take 1,000 mg by mouth every 6 (six) hours as needed for mild pain or headache.     . albuterol (PROVENTIL) (2.5 MG/3ML) 0.083% nebulizer solution Take 2.5 mg by nebulization every 4 (four) hours as needed for wheezing or shortness of breath.     Marland Kitchen albuterol (PROVENTIL,VENTOLIN) 90 MCG/ACT inhaler Inhale 2 puffs into the lungs every 4 (four) hours as needed for wheezing or shortness of breath.     . Alirocumab (PRALUENT) 150 MG/ML SOPN Inject 1 pen into the skin every 14 (fourteen) days. 6 pen 3  . ALPRAZolam (XANAX) 0.5 MG tablet Take 0.5-1 mg by mouth See admin instructions. Take 0.5 mg by mouth in the morning. Take  0.5 mg by mouth at noon if needed for anxiety. Take 1 mg by mouth at bedtime.    Marland Kitchen amiodarone (PACERONE) 100 MG tablet Take 1 tablet (100 mg total) by mouth daily. 30 tablet 6  . amLODipine (NORVASC) 10 MG tablet Take 10 mg by mouth at bedtime.     Marland Kitchen apixaban (ELIQUIS) 5 MG TABS tablet Take 1 tablet (5 mg total) by mouth 2 (two) times daily. 60 tablet 6  . b complex vitamins capsule Take 1 capsule by mouth daily.    Marland Kitchen buPROPion (WELLBUTRIN XL) 150 MG 24 hr tablet Take 150 mg by mouth daily.    . Calcium Carbonate 1500 (600 CA) MG TABS Take 1,500 mg by mouth daily with breakfast.    . Cholecalciferol (VITAMIN D) 2000 UNITS tablet Take 2,000 Units by mouth daily with breakfast.     . cloNIDine (CATAPRES) 0.2 MG tablet Take 1 tablet (0.2 mg total) by mouth 2 (two) times daily. 60 tablet 11  . clopidogrel (PLAVIX) 75 MG tablet TAKE ONE (1) TABLET BY MOUTH EVERY DAY 90 tablet 2  . denosumab (PROLIA) 60 MG/ML SOLN injection Inject 60 mg into the skin every 6 (six) months. Administer in upper arm, thigh, or abdomen    . DULoxetine (CYMBALTA) 30 MG capsule Take 1 capsule (30 mg total) by mouth 2 (two) times daily. (Patient taking differently: Take 30 mg by mouth daily. ) 180 capsule 3  . ezetimibe (ZETIA) 10 MG tablet TAKE ONE (1) TABLET EACH DAY (Patient taking differently: Take  10 mg by mouth at bedtime. ) 90 tablet 3  . Fluticasone-Salmeterol (ADVAIR DISKUS) 250-50 MCG/DOSE AEPB Inhale 1 puff into the lungs 2 (two) times daily.     Marland Kitchen gabapentin (NEURONTIN) 600 MG tablet Take 600-1,200 mg by mouth See admin instructions. Take 600 mg by mouth in the morning and take 600 mg by mouth in the afternoon. Take 1200 mg by mouth at bedtime.    Marland Kitchen HUMALOG KWIKPEN 100 UNIT/ML SOPN Inject 18-26 Units into the skin 3 (three) times daily before meals. Per sliding scale    . HYDROcodone-acetaminophen (NORCO/VICODIN) 5-325 MG per tablet Take 1 tablet by mouth at bedtime.     . Insulin Glargine (LANTUS SOLOSTAR) 100 UNIT/ML Solostar Pen Inject 50 Units into the skin at bedtime.     Marland Kitchen ipratropium-albuterol (DUONEB) 0.5-2.5 (3) MG/3ML SOLN Take 3 mLs by nebulization every 4 (four) hours as needed (for severe shortness of breath or wheezing).     Marland Kitchen loperamide (IMODIUM) 2 MG capsule Take 2 mg by mouth as needed for diarrhea or loose stools.    Marland Kitchen losartan (COZAAR) 100 MG tablet Take 100 mg by mouth daily.    . magnesium oxide (MAG-OX) 400 MG tablet Take 400 mg by mouth 2 (two) times daily.    . meclizine (ANTIVERT) 12.5 MG tablet Take 12.5 mg by mouth 3 (three) times daily as needed for dizziness.    . methocarbamol (ROBAXIN) 500 MG tablet TAKE ONE (1) TABLET THREE (3) TIMES EACH DAY (Patient taking differently: Take 500 mg by mouth 3 (three) times daily. ) 60 tablet 4  . metolazone (ZAROXOLYN) 2.5 MG tablet Take 1 tablet (2.5 mg total) by mouth once a week. Take 1 Tablet once weekly on Mondays. (Patient taking differently: Take 2.5 mg by mouth every Monday. ) 15 tablet 3  . metoprolol succinate (TOPROL-XL) 25 MG 24 hr tablet Take 1 tablet (25 mg total) by mouth daily. 180 tablet 3  .  Multiple Vitamin (MULTIVITAMIN) capsule Take 1 capsule by mouth daily.    . multivitamin-lutein (OCUVITE-LUTEIN) CAPS capsule Take 1 capsule by mouth daily.    . nitroGLYCERIN (NITROSTAT) 0.3 MG SL tablet Place 1  tablet (0.3 mg total) under the tongue every 5 (five) minutes x 3 doses as needed for chest pain. 9 tablet 1  . ondansetron (ZOFRAN) 4 MG tablet Take 1 tablet (4 mg total) by mouth 2 (two) times daily as needed. For nausea (Patient taking differently: Take 4 mg by mouth 2 (two) times daily. ) 20 tablet 0  . OXYGEN Inhale 2 L into the lungs at bedtime.     . pantoprazole (PROTONIX) 40 MG tablet Take 1 tablet (40 mg total) by mouth 2 (two) times daily. 180 tablet 0  . Polyvinyl Alcohol-Povidone PF (REFRESH) 1.4-0.6 % SOLN Place 2 drops into both eyes 2 (two) times daily as needed (for dry eyes).    . potassium chloride SA (K-DUR,KLOR-CON) 20 MEQ tablet Take 1 tablet (20 mEq total) by mouth 2 (two) times daily. 60 tablet 1  . ranolazine (RANEXA) 1000 MG SR tablet Take 1 tablet (1,000 mg total) by mouth 2 (two) times daily. 180 tablet 3  . rosuvastatin (CRESTOR) 40 MG tablet TAKE ONE (1) TABLET EACH DAY (Patient taking differently: Take 40 mg by mouth at bedtime. ) 90 tablet 6  . torsemide (DEMADEX) 20 MG tablet Take 5 tablets (100 mg total) by mouth 2 (two) times daily. 240 tablet 3  . triamcinolone cream (KENALOG) 0.1 % Apply 1 application topically 2 (two) times daily.     . vitamin B-12 (CYANOCOBALAMIN) 1000 MCG tablet Take 1,000 mcg by mouth daily.    Marland Kitchen zinc gluconate 50 MG tablet Take 50 mg by mouth daily.     Marland Kitchen zolpidem (AMBIEN) 10 MG tablet Take 10 mg by mouth at bedtime.      No facility-administered medications prior to visit.      Review of Systems:   Constitutional:   No  weight loss, night sweats,  Fevers, chills, + fatigue, or  lassitude.  HEENT:   No headaches,  Difficulty swallowing,  Tooth/dental problems, or  Sore throat,                No sneezing, itching, ear ache, nasal congestion, post nasal drip,   CV:  No chest pain,  Orthopnea, PND, swelling in lower extremities, anasarca, dizziness, palpitations, syncope.   GI  No heartburn, indigestion, abdominal pain, nausea,  vomiting, diarrhea, change in bowel habits, loss of appetite, bloody stools.   Resp:  .  No chest wall deformity  Skin: no rash or lesions.  GU: no dysuria, change in color of urine, no urgency or frequency.  No flank pain, no hematuria   MS:  No joint pain or swelling.  No decreased range of motion.  No back pain.    Physical Exam  BP 118/62 (BP Location: Left Wrist, Cuff Size: Normal)   Pulse (!) 53   Ht 5' 2.5" (1.588 m)   Wt (!) 315 lb (142.9 kg)   SpO2 93%   BMI 56.70 kg/m   GEN: A/Ox3; pleasant , NAD, morbidly obese , elderly ,unstable gait    HEENT:  [place]/AT,  EACs-clear, TMs-wnl, NOSE-clear, THROAT-clear, no lesions, no postnasal drip or exudate noted.   NECK:  Supple w/ fair ROM; no JVD; normal carotid impulses w/o bruits; no thyromegaly or nodules palpated; no lymphadenopathy.    RESP  Clear  P &  A; w/o, wheezes/ rales/ or rhonchi. no accessory muscle use, no dullness to percussion  CARD:  RRR, no m/r/g, 2-3 + peripheral edema, pulses intact, no cyanosis or clubbing.  GI:   Soft & nt; nml bowel sounds; no organomegaly or masses detected.  Large panniculus   Musco: Warm bil, no deformities or joint swelling noted.   Neuro: alert, no focal deficits noted.    Skin: Warm, no lesions or rashes    Lab Results:  CBC    Component Value Date/Time   WBC 10.1 02/08/2018 1057   RBC 3.98 02/08/2018 1057   HGB 12.9 02/08/2018 1057   HCT 41.2 02/08/2018 1057   PLT 192 02/08/2018 1057   MCV 103.5 (H) 02/08/2018 1057   MCH 32.4 02/08/2018 1057   MCHC 31.3 02/08/2018 1057   RDW 13.7 02/08/2018 1057   LYMPHSABS 1.8 01/16/2018 2151   MONOABS 0.7 01/16/2018 2151   EOSABS 0.5 01/16/2018 2151   BASOSABS 0.1 01/16/2018 2151    BMET    Component Value Date/Time   NA 142 03/20/2018 1220   K 4.3 03/20/2018 1220   CL 100 03/20/2018 1220   CO2 31 03/20/2018 1220   GLUCOSE 106 (H) 03/20/2018 1220   BUN 26 (H) 03/20/2018 1220   CREATININE 1.73 (H) 03/20/2018 1220    CREATININE 1.07 (H) 05/28/2015 1230   CALCIUM 9.1 03/20/2018 1220   GFRNONAA 26 (L) 02/08/2018 1057   GFRAA 30 (L) 02/08/2018 1057    BNP    Component Value Date/Time   BNP 701.3 (H) 01/16/2018 2150   BNP 84.2 05/21/2015 1509    ProBNP    Component Value Date/Time   PROBNP 39.0 10/08/2014 0828    Imaging: No results found.    No flowsheet data found.  No results found for: NITRICOXIDE      Assessment & Plan:   Chronic diastolic CHF (congestive heart failure) ([place]) Appears decompensated with weight gain 20lbs and increased legs swelling .  Adjust diuretics as kidney fxn will allow Refer back to CHF clinic  Low salt diet  bmet and bnp pending   Plan  Patient Instructions  Return to Torsemide 20mg  5 tabs Twice daily  .  Take extra Metolazone 2.5mg  on Tuesday for 1 dose.  Labs today  Call the CHF clinic let them know about your 20lb weight increase . Ask about [facility] .  Weight daily . Keep log Low salt diet  Continue on Advair 1 puff Twice daily  , rinse after use.  Order for Home PT  Order for rolling walker with bench seat and basket . (Rollator) .  Follow up with PCP as planned in 6 weeks or sooner if needed Follow up with Dr. Elsworth Soho  Or Arnez Stoneking NP in 6 months and As needed   Please contact office for sooner follow up if symptoms do not improve or worsen or seek emergency care         Asthma Controlled on Advair  No changes  Abnormality of gait Physical deconditioning and unsteady gait-high risk for fall  Add rolling walker -rollator -need bench to sit and rest to prevent fall  Add PT at home , need strengthening exercises and balance /gait   Plan  Patient Instructions  Return to Torsemide 20mg  5 tabs Twice daily  .  Take extra Metolazone 2.5mg  on Tuesday for 1 dose.  Labs today  Call the CHF clinic let them know about your 20lb weight increase . Ask about [facility] .  Weight  daily . Keep log Low salt diet  Continue on Advair 1 puff  Twice daily  , rinse after use.  Order for Home PT  Order for rolling walker with bench seat and basket . (Rollator) .  Follow up with PCP as planned in 6 weeks or sooner if needed Follow up with Dr. Elsworth Soho  Or Antonius Hartlage NP in 6 months and As needed   Please contact office for sooner follow up if symptoms do not improve or worsen or seek emergency care         Nocturnal hypoxia Cont on O2 At bedtime       Rexene Edison, NP 03/20/2018

## 2018-03-20 NOTE — Addendum Note (Signed)
Addended by: Parke Poisson E on: 03/20/2018 03:37 PM   Modules accepted: Orders

## 2018-03-20 NOTE — Progress Notes (Signed)
Called spoke with patient's spouse - LMTCB x1

## 2018-03-20 NOTE — Assessment & Plan Note (Signed)
Appears decompensated with weight gain 20lbs and increased legs swelling .  Adjust diuretics as kidney fxn will allow Refer back to CHF clinic  Low salt diet  bmet and bnp pending   Plan  Patient Instructions  Return to Torsemide 20mg  5 tabs Twice daily  .  Take extra Metolazone 2.5mg  on Tuesday for 1 dose.  Labs today  Call the CHF clinic let them know about your 20lb weight increase . Ask about [facility] .  Weight daily . Keep log Low salt diet  Continue on Advair 1 puff Twice daily  , rinse after use.  Order for Home PT  Order for rolling walker with bench seat and basket . (Rollator) .  Follow up with PCP as planned in 6 weeks or sooner if needed Follow up with Dr. Elsworth Soho  Or Illyria Sobocinski NP in 6 months and As needed   Please contact office for sooner follow up if symptoms do not improve or worsen or seek emergency care

## 2018-03-20 NOTE — Assessment & Plan Note (Signed)
Controlled on Advair  No changes

## 2018-03-20 NOTE — Assessment & Plan Note (Signed)
Physical deconditioning and unsteady gait-high risk for fall  Add rolling walker -rollator -need bench to sit and rest to prevent fall  Add PT at home , need strengthening exercises and balance /gait   Plan  Patient Instructions  Return to Torsemide 20mg  5 tabs Twice daily  .  Take extra Metolazone 2.5mg  on Tuesday for 1 dose.  Labs today  Call the CHF clinic let them know about your 20lb weight increase . Ask about [facility] .  Weight daily . Keep log Low salt diet  Continue on Advair 1 puff Twice daily  , rinse after use.  Order for Home PT  Order for rolling walker with bench seat and basket . (Rollator) .  Follow up with PCP as planned in 6 weeks or sooner if needed Follow up with Dr. Elsworth Soho  Or Ryane Konieczny NP in 6 months and As needed   Please contact office for sooner follow up if symptoms do not improve or worsen or seek emergency care

## 2018-03-20 NOTE — Assessment & Plan Note (Signed)
Cont on O2 At bedtime   

## 2018-03-20 NOTE — Patient Instructions (Addendum)
Return to Torsemide 20mg  5 tabs Twice daily  .  Take extra Metolazone 2.5mg  on Tuesday for 1 dose.  Labs today  Call the CHF clinic let them know about your 20lb weight increase . Ask about [facility] .  Weight daily . Keep log Low salt diet  Continue on Advair 1 puff Twice daily  , rinse after use.  Order for Home PT  Order for rolling walker with bench seat and basket . (Rollator) .  Follow up with PCP as planned in 6 weeks or sooner if needed Follow up with Dr. Elsworth Soho  Or Jarell Mcewen NP in 6 months and As needed   Please contact office for sooner follow up if symptoms do not improve or worsen or seek emergency care

## 2018-03-21 ENCOUNTER — Telehealth: Payer: Self-pay | Admitting: Adult Health

## 2018-03-21 NOTE — Telephone Encounter (Signed)
Spoke with Melissa at [facility]. She stated that the home health care manager reviewed patient's chart and saw where the patient has a history of CHF and has gained 20 lbs as a results. She wanted to know if TP would be willing to add on a home health nurse to the order of home PT for better care for the patient.   All she is need is a verbal. If she does not answer the phone, it is ok to leave a message on her VM.   TP, please advise. Thanks!

## 2018-03-21 NOTE — Telephone Encounter (Signed)
Per TP: okay for referral for home health nurse.  Thank you.

## 2018-03-21 NOTE — Telephone Encounter (Signed)
lmtcb for Melissa at [facility].

## 2018-03-21 NOTE — Telephone Encounter (Signed)
lmom to call back with [facility]

## 2018-03-22 ENCOUNTER — Ambulatory Visit (HOSPITAL_COMMUNITY): Payer: Medicare Other

## 2018-03-22 NOTE — Telephone Encounter (Signed)
Called and left Melissa a detailed msg letting her know the home health nurse ok per TP

## 2018-03-24 ENCOUNTER — Ambulatory Visit (HOSPITAL_COMMUNITY): Payer: Medicare Other

## 2018-03-24 ENCOUNTER — Telehealth: Payer: Self-pay | Admitting: Adult Health

## 2018-03-24 DIAGNOSIS — R269 Unspecified abnormalities of gait and mobility: Secondary | ICD-10-CM

## 2018-03-24 DIAGNOSIS — I5032 Chronic diastolic (congestive) heart failure: Secondary | ICD-10-CM

## 2018-03-24 NOTE — Telephone Encounter (Signed)
That is fine , please send

## 2018-03-24 NOTE — Telephone Encounter (Signed)
New order placed with specified bariatric rollator. Nothing further needed at this time- will close encounter.

## 2018-03-24 NOTE — Telephone Encounter (Signed)
Called [facility] and spoke with Daria. She stated that a previous order had been placed for a rollator, rolling walker with seat and basket.  The Patient weighs 315lbs, so order needs to be bariatric rollator.    Tammy P, NP, please advise if ok to reorder rollator

## 2018-03-25 ENCOUNTER — Inpatient Hospital Stay (HOSPITAL_COMMUNITY)
Admission: EM | Admit: 2018-03-25 | Discharge: 2018-04-10 | DRG: 291 | Disposition: A | Discharge status: Skilled Nursing Facility | Payer: Medicare Other | Attending: Internal Medicine | Admitting: Internal Medicine

## 2018-03-25 ENCOUNTER — Other Ambulatory Visit: Payer: Self-pay

## 2018-03-25 ENCOUNTER — Encounter (HOSPITAL_COMMUNITY): Payer: Self-pay | Admitting: *Deleted

## 2018-03-25 ENCOUNTER — Emergency Department (HOSPITAL_COMMUNITY): Payer: Medicare Other

## 2018-03-25 DIAGNOSIS — Z794 Long term (current) use of insulin: Secondary | ICD-10-CM

## 2018-03-25 DIAGNOSIS — K219 Gastro-esophageal reflux disease without esophagitis: Secondary | ICD-10-CM | POA: Diagnosis present

## 2018-03-25 DIAGNOSIS — I160 Hypertensive urgency: Secondary | ICD-10-CM | POA: Diagnosis present

## 2018-03-25 DIAGNOSIS — E785 Hyperlipidemia, unspecified: Secondary | ICD-10-CM | POA: Diagnosis present

## 2018-03-25 DIAGNOSIS — I509 Heart failure, unspecified: Secondary | ICD-10-CM | POA: Diagnosis not present

## 2018-03-25 DIAGNOSIS — D6959 Other secondary thrombocytopenia: Secondary | ICD-10-CM | POA: Diagnosis not present

## 2018-03-25 DIAGNOSIS — E876 Hypokalemia: Secondary | ICD-10-CM | POA: Diagnosis not present

## 2018-03-25 DIAGNOSIS — N189 Chronic kidney disease, unspecified: Secondary | ICD-10-CM | POA: Diagnosis not present

## 2018-03-25 DIAGNOSIS — Z888 Allergy status to other drugs, medicaments and biological substances status: Secondary | ICD-10-CM

## 2018-03-25 DIAGNOSIS — I5033 Acute on chronic diastolic (congestive) heart failure: Secondary | ICD-10-CM

## 2018-03-25 DIAGNOSIS — I480 Paroxysmal atrial fibrillation: Secondary | ICD-10-CM | POA: Diagnosis present

## 2018-03-25 DIAGNOSIS — E1169 Type 2 diabetes mellitus with other specified complication: Secondary | ICD-10-CM | POA: Diagnosis not present

## 2018-03-25 DIAGNOSIS — I25119 Atherosclerotic heart disease of native coronary artery with unspecified angina pectoris: Secondary | ICD-10-CM | POA: Diagnosis present

## 2018-03-25 DIAGNOSIS — Z7951 Long term (current) use of inhaled steroids: Secondary | ICD-10-CM

## 2018-03-25 DIAGNOSIS — G9340 Encephalopathy, unspecified: Secondary | ICD-10-CM | POA: Diagnosis not present

## 2018-03-25 DIAGNOSIS — N390 Urinary tract infection, site not specified: Secondary | ICD-10-CM | POA: Diagnosis present

## 2018-03-25 DIAGNOSIS — Z452 Encounter for adjustment and management of vascular access device: Secondary | ICD-10-CM

## 2018-03-25 DIAGNOSIS — R7989 Other specified abnormal findings of blood chemistry: Secondary | ICD-10-CM | POA: Diagnosis not present

## 2018-03-25 DIAGNOSIS — J45909 Unspecified asthma, uncomplicated: Secondary | ICD-10-CM | POA: Diagnosis present

## 2018-03-25 DIAGNOSIS — J984 Other disorders of lung: Secondary | ICD-10-CM

## 2018-03-25 DIAGNOSIS — E870 Hyperosmolality and hypernatremia: Secondary | ICD-10-CM | POA: Diagnosis not present

## 2018-03-25 DIAGNOSIS — F5104 Psychophysiologic insomnia: Secondary | ICD-10-CM | POA: Diagnosis present

## 2018-03-25 DIAGNOSIS — I4891 Unspecified atrial fibrillation: Secondary | ICD-10-CM | POA: Diagnosis not present

## 2018-03-25 DIAGNOSIS — Z79891 Long term (current) use of opiate analgesic: Secondary | ICD-10-CM

## 2018-03-25 DIAGNOSIS — Z978 Presence of other specified devices: Secondary | ICD-10-CM

## 2018-03-25 DIAGNOSIS — F329 Major depressive disorder, single episode, unspecified: Secondary | ICD-10-CM | POA: Diagnosis present

## 2018-03-25 DIAGNOSIS — E1165 Type 2 diabetes mellitus with hyperglycemia: Secondary | ICD-10-CM | POA: Diagnosis present

## 2018-03-25 DIAGNOSIS — J810 Acute pulmonary edema: Secondary | ICD-10-CM | POA: Diagnosis not present

## 2018-03-25 DIAGNOSIS — J9620 Acute and chronic respiratory failure, unspecified whether with hypoxia or hypercapnia: Secondary | ICD-10-CM | POA: Diagnosis not present

## 2018-03-25 DIAGNOSIS — R410 Disorientation, unspecified: Secondary | ICD-10-CM | POA: Diagnosis not present

## 2018-03-25 DIAGNOSIS — E1122 Type 2 diabetes mellitus with diabetic chronic kidney disease: Secondary | ICD-10-CM | POA: Diagnosis present

## 2018-03-25 DIAGNOSIS — E662 Morbid (severe) obesity with alveolar hypoventilation: Secondary | ICD-10-CM | POA: Diagnosis present

## 2018-03-25 DIAGNOSIS — Z0189 Encounter for other specified special examinations: Secondary | ICD-10-CM

## 2018-03-25 DIAGNOSIS — D696 Thrombocytopenia, unspecified: Secondary | ICD-10-CM

## 2018-03-25 DIAGNOSIS — D631 Anemia in chronic kidney disease: Secondary | ICD-10-CM | POA: Diagnosis present

## 2018-03-25 DIAGNOSIS — I361 Nonrheumatic tricuspid (valve) insufficiency: Secondary | ICD-10-CM | POA: Diagnosis not present

## 2018-03-25 DIAGNOSIS — I447 Left bundle-branch block, unspecified: Secondary | ICD-10-CM | POA: Diagnosis present

## 2018-03-25 DIAGNOSIS — I5021 Acute systolic (congestive) heart failure: Secondary | ICD-10-CM | POA: Diagnosis not present

## 2018-03-25 DIAGNOSIS — J189 Pneumonia, unspecified organism: Secondary | ICD-10-CM

## 2018-03-25 DIAGNOSIS — I130 Hypertensive heart and chronic kidney disease with heart failure and stage 1 through stage 4 chronic kidney disease, or unspecified chronic kidney disease: Principal | ICD-10-CM | POA: Diagnosis present

## 2018-03-25 DIAGNOSIS — Z951 Presence of aortocoronary bypass graft: Secondary | ICD-10-CM

## 2018-03-25 DIAGNOSIS — D693 Immune thrombocytopenic purpura: Secondary | ICD-10-CM

## 2018-03-25 DIAGNOSIS — J9621 Acute and chronic respiratory failure with hypoxia: Secondary | ICD-10-CM | POA: Diagnosis not present

## 2018-03-25 DIAGNOSIS — I252 Old myocardial infarction: Secondary | ICD-10-CM

## 2018-03-25 DIAGNOSIS — I272 Pulmonary hypertension, unspecified: Secondary | ICD-10-CM | POA: Diagnosis present

## 2018-03-25 DIAGNOSIS — G92 Toxic encephalopathy: Secondary | ICD-10-CM | POA: Diagnosis present

## 2018-03-25 DIAGNOSIS — Z85820 Personal history of malignant melanoma of skin: Secondary | ICD-10-CM

## 2018-03-25 DIAGNOSIS — T502X5A Adverse effect of carbonic-anhydrase inhibitors, benzothiadiazides and other diuretics, initial encounter: Secondary | ICD-10-CM | POA: Diagnosis present

## 2018-03-25 DIAGNOSIS — G4733 Obstructive sleep apnea (adult) (pediatric): Secondary | ICD-10-CM | POA: Diagnosis not present

## 2018-03-25 DIAGNOSIS — I371 Nonrheumatic pulmonary valve insufficiency: Secondary | ICD-10-CM | POA: Diagnosis not present

## 2018-03-25 DIAGNOSIS — Z8249 Family history of ischemic heart disease and other diseases of the circulatory system: Secondary | ICD-10-CM

## 2018-03-25 DIAGNOSIS — Z9981 Dependence on supplemental oxygen: Secondary | ICD-10-CM | POA: Diagnosis not present

## 2018-03-25 DIAGNOSIS — R5381 Other malaise: Secondary | ICD-10-CM | POA: Diagnosis not present

## 2018-03-25 DIAGNOSIS — J9690 Respiratory failure, unspecified, unspecified whether with hypoxia or hypercapnia: Secondary | ICD-10-CM

## 2018-03-25 DIAGNOSIS — I5043 Acute on chronic combined systolic (congestive) and diastolic (congestive) heart failure: Secondary | ICD-10-CM | POA: Diagnosis not present

## 2018-03-25 DIAGNOSIS — B9620 Unspecified Escherichia coli [E. coli] as the cause of diseases classified elsewhere: Secondary | ICD-10-CM | POA: Diagnosis present

## 2018-03-25 DIAGNOSIS — J9601 Acute respiratory failure with hypoxia: Secondary | ICD-10-CM

## 2018-03-25 DIAGNOSIS — Z79899 Other long term (current) drug therapy: Secondary | ICD-10-CM

## 2018-03-25 DIAGNOSIS — W19XXXA Unspecified fall, initial encounter: Secondary | ICD-10-CM | POA: Diagnosis present

## 2018-03-25 DIAGNOSIS — G8929 Other chronic pain: Secondary | ICD-10-CM | POA: Diagnosis present

## 2018-03-25 DIAGNOSIS — N183 Chronic kidney disease, stage 3 (moderate): Secondary | ICD-10-CM | POA: Diagnosis present

## 2018-03-25 DIAGNOSIS — Z880 Allergy status to penicillin: Secondary | ICD-10-CM

## 2018-03-25 DIAGNOSIS — A419 Sepsis, unspecified organism: Secondary | ICD-10-CM | POA: Diagnosis not present

## 2018-03-25 DIAGNOSIS — Z7902 Long term (current) use of antithrombotics/antiplatelets: Secondary | ICD-10-CM

## 2018-03-25 DIAGNOSIS — I5022 Chronic systolic (congestive) heart failure: Secondary | ICD-10-CM | POA: Diagnosis not present

## 2018-03-25 DIAGNOSIS — I248 Other forms of acute ischemic heart disease: Secondary | ICD-10-CM | POA: Diagnosis present

## 2018-03-25 DIAGNOSIS — Z9049 Acquired absence of other specified parts of digestive tract: Secondary | ICD-10-CM

## 2018-03-25 DIAGNOSIS — E1142 Type 2 diabetes mellitus with diabetic polyneuropathy: Secondary | ICD-10-CM | POA: Diagnosis present

## 2018-03-25 DIAGNOSIS — T462X5A Adverse effect of other antidysrhythmic drugs, initial encounter: Secondary | ICD-10-CM | POA: Diagnosis not present

## 2018-03-25 DIAGNOSIS — Z8349 Family history of other endocrine, nutritional and metabolic diseases: Secondary | ICD-10-CM

## 2018-03-25 DIAGNOSIS — J811 Chronic pulmonary edema: Secondary | ICD-10-CM

## 2018-03-25 DIAGNOSIS — Z6841 Body Mass Index (BMI) 40.0 and over, adult: Secondary | ICD-10-CM | POA: Diagnosis not present

## 2018-03-25 DIAGNOSIS — N179 Acute kidney failure, unspecified: Secondary | ICD-10-CM

## 2018-03-25 DIAGNOSIS — R579 Shock, unspecified: Secondary | ICD-10-CM | POA: Diagnosis not present

## 2018-03-25 DIAGNOSIS — Z9989 Dependence on other enabling machines and devices: Secondary | ICD-10-CM | POA: Diagnosis not present

## 2018-03-25 DIAGNOSIS — R6521 Severe sepsis with septic shock: Secondary | ICD-10-CM | POA: Diagnosis not present

## 2018-03-25 DIAGNOSIS — T380X5A Adverse effect of glucocorticoids and synthetic analogues, initial encounter: Secondary | ICD-10-CM | POA: Diagnosis not present

## 2018-03-25 DIAGNOSIS — Z91048 Other nonmedicinal substance allergy status: Secondary | ICD-10-CM

## 2018-03-25 DIAGNOSIS — Z9289 Personal history of other medical treatment: Secondary | ICD-10-CM

## 2018-03-25 DIAGNOSIS — E872 Acidosis: Secondary | ICD-10-CM | POA: Diagnosis not present

## 2018-03-25 DIAGNOSIS — Y95 Nosocomial condition: Secondary | ICD-10-CM | POA: Diagnosis present

## 2018-03-25 DIAGNOSIS — Z8673 Personal history of transient ischemic attack (TIA), and cerebral infarction without residual deficits: Secondary | ICD-10-CM

## 2018-03-25 DIAGNOSIS — Z7901 Long term (current) use of anticoagulants: Secondary | ICD-10-CM

## 2018-03-25 DIAGNOSIS — Z90710 Acquired absence of both cervix and uterus: Secondary | ICD-10-CM

## 2018-03-25 DIAGNOSIS — K5900 Constipation, unspecified: Secondary | ICD-10-CM | POA: Diagnosis present

## 2018-03-25 DIAGNOSIS — F40240 Claustrophobia: Secondary | ICD-10-CM | POA: Diagnosis present

## 2018-03-25 DIAGNOSIS — Z789 Other specified health status: Secondary | ICD-10-CM

## 2018-03-25 DIAGNOSIS — R570 Cardiogenic shock: Secondary | ICD-10-CM | POA: Diagnosis present

## 2018-03-25 DIAGNOSIS — I5023 Acute on chronic systolic (congestive) heart failure: Secondary | ICD-10-CM | POA: Diagnosis not present

## 2018-03-25 DIAGNOSIS — Z833 Family history of diabetes mellitus: Secondary | ICD-10-CM

## 2018-03-25 DIAGNOSIS — Z955 Presence of coronary angioplasty implant and graft: Secondary | ICD-10-CM

## 2018-03-25 DIAGNOSIS — R001 Bradycardia, unspecified: Secondary | ICD-10-CM | POA: Diagnosis not present

## 2018-03-25 DIAGNOSIS — Z9111 Patient's noncompliance with dietary regimen: Secondary | ICD-10-CM

## 2018-03-25 DIAGNOSIS — R1110 Vomiting, unspecified: Secondary | ICD-10-CM | POA: Diagnosis not present

## 2018-03-25 DIAGNOSIS — E1151 Type 2 diabetes mellitus with diabetic peripheral angiopathy without gangrene: Secondary | ICD-10-CM | POA: Diagnosis present

## 2018-03-25 DIAGNOSIS — Z883 Allergy status to other anti-infective agents status: Secondary | ICD-10-CM

## 2018-03-25 LAB — LACTIC ACID, PLASMA
Lactic Acid, Venous: 1.8 mmol/L (ref 0.5–1.9)
Lactic Acid, Venous: 2.2 mmol/L (ref 0.5–1.9)

## 2018-03-25 LAB — CBC WITH DIFFERENTIAL/PLATELET
Abs Immature Granulocytes: 0.04 10*3/uL (ref 0.00–0.07)
Basophils Absolute: 0 10*3/uL (ref 0.0–0.1)
Basophils Relative: 0 %
Eosinophils Absolute: 0 10*3/uL (ref 0.0–0.5)
Eosinophils Relative: 0 %
HCT: 43.9 % (ref 36.0–46.0)
Hemoglobin: 13.4 g/dL (ref 12.0–15.0)
Immature Granulocytes: 0 %
Lymphocytes Relative: 6 %
Lymphs Abs: 0.6 10*3/uL — ABNORMAL LOW (ref 0.7–4.0)
MCH: 30.9 pg (ref 26.0–34.0)
MCHC: 30.5 g/dL (ref 30.0–36.0)
MCV: 101.4 fL — ABNORMAL HIGH (ref 80.0–100.0)
Monocytes Absolute: 0.6 10*3/uL (ref 0.1–1.0)
Monocytes Relative: 7 %
Neutro Abs: 8.1 10*3/uL — ABNORMAL HIGH (ref 1.7–7.7)
Neutrophils Relative %: 87 %
Platelets: 104 10*3/uL — ABNORMAL LOW (ref 150–400)
RBC: 4.33 MIL/uL (ref 3.87–5.11)
RDW: 14.1 % (ref 11.5–15.5)
WBC: 9.4 10*3/uL (ref 4.0–10.5)
nRBC: 0 % (ref 0.0–0.2)

## 2018-03-25 LAB — TROPONIN I
Troponin I: 0.05 ng/mL (ref <0.03)
Troponin I: 0.4 ng/mL (ref <0.03)

## 2018-03-25 LAB — POCT I-STAT 7, (LYTES, BLD GAS, ICA,H+H)
Acid-Base Excess: 2 mmol/L (ref 0.0–2.0)
Bicarbonate: 28.6 mmol/L — ABNORMAL HIGH (ref 20.0–28.0)
Calcium, Ion: 1.13 mmol/L — ABNORMAL LOW (ref 1.15–1.40)
HCT: 35 % — ABNORMAL LOW (ref 36.0–46.0)
Hemoglobin: 11.9 g/dL — ABNORMAL LOW (ref 12.0–15.0)
O2 Saturation: 97 %
Patient temperature: 98.6
Potassium: 4.2 mmol/L (ref 3.5–5.1)
Sodium: 138 mmol/L (ref 135–145)
TCO2: 30 mmol/L (ref 22–32)
pCO2 arterial: 53.6 mmHg — ABNORMAL HIGH (ref 32.0–48.0)
pH, Arterial: 7.335 — ABNORMAL LOW (ref 7.350–7.450)
pO2, Arterial: 103 mmHg (ref 83.0–108.0)

## 2018-03-25 LAB — COMPREHENSIVE METABOLIC PANEL
ALT: 22 U/L (ref 0–44)
AST: 38 U/L (ref 15–41)
Albumin: 3.9 g/dL (ref 3.5–5.0)
Alkaline Phosphatase: 72 U/L (ref 38–126)
Anion gap: 11 (ref 5–15)
BUN: 15 mg/dL (ref 8–23)
CO2: 26 mmol/L (ref 22–32)
Calcium: 9.1 mg/dL (ref 8.9–10.3)
Chloride: 101 mmol/L (ref 98–111)
Creatinine, Ser: 1.34 mg/dL — ABNORMAL HIGH (ref 0.44–1.00)
GFR calc Af Amer: 46 mL/min — ABNORMAL LOW (ref >60)
GFR calc non Af Amer: 40 mL/min — ABNORMAL LOW (ref >60)
Glucose, Bld: 166 mg/dL — ABNORMAL HIGH (ref 70–99)
Potassium: 4.1 mmol/L (ref 3.5–5.1)
Sodium: 138 mmol/L (ref 135–145)
Total Bilirubin: 1.5 mg/dL — ABNORMAL HIGH (ref 0.3–1.2)
Total Protein: 7.3 g/dL (ref 6.5–8.1)

## 2018-03-25 LAB — URINALYSIS, ROUTINE W REFLEX MICROSCOPIC
Bilirubin Urine: NEGATIVE
Glucose, UA: 50 mg/dL — AB
Ketones, ur: 20 mg/dL — AB
Leukocytes,Ua: NEGATIVE
Nitrite: NEGATIVE
Protein, ur: >=300 mg/dL — AB
Specific Gravity, Urine: 1.017 (ref 1.005–1.030)
pH: 5 (ref 5.0–8.0)

## 2018-03-25 LAB — INFLUENZA PANEL BY PCR (TYPE A & B)
Influenza A By PCR: NEGATIVE
Influenza B By PCR: NEGATIVE

## 2018-03-25 LAB — PROTIME-INR
INR: 1.6
Prothrombin Time: 18.8 seconds — ABNORMAL HIGH (ref 11.4–15.2)

## 2018-03-25 LAB — POCT I-STAT EG7
Acid-Base Excess: 4 mmol/L — ABNORMAL HIGH (ref 0.0–2.0)
Bicarbonate: 30 mmol/L — ABNORMAL HIGH (ref 20.0–28.0)
Calcium, Ion: 1.09 mmol/L — ABNORMAL LOW (ref 1.15–1.40)
HCT: 42 % (ref 36.0–46.0)
Hemoglobin: 14.3 g/dL (ref 12.0–15.0)
O2 Saturation: 76 %
Potassium: 4.1 mmol/L (ref 3.5–5.1)
Sodium: 139 mmol/L (ref 135–145)
TCO2: 32 mmol/L (ref 22–32)
pCO2, Ven: 50.3 mmHg (ref 44.0–60.0)
pH, Ven: 7.384 (ref 7.250–7.430)
pO2, Ven: 42 mmHg (ref 32.0–45.0)

## 2018-03-25 LAB — PHOSPHORUS: Phosphorus: 2.8 mg/dL (ref 2.5–4.6)

## 2018-03-25 LAB — BRAIN NATRIURETIC PEPTIDE: B Natriuretic Peptide: 1480.1 pg/mL — ABNORMAL HIGH (ref 0.0–100.0)

## 2018-03-25 LAB — LIPASE, BLOOD: Lipase: 19 U/L (ref 11–51)

## 2018-03-25 LAB — MAGNESIUM: Magnesium: 2 mg/dL (ref 1.7–2.4)

## 2018-03-25 LAB — GLUCOSE, CAPILLARY: Glucose-Capillary: 170 mg/dL — ABNORMAL HIGH (ref 70–99)

## 2018-03-25 MED ORDER — IPRATROPIUM-ALBUTEROL 0.5-2.5 (3) MG/3ML IN SOLN
3.0000 mL | RESPIRATORY_TRACT | Status: DC | PRN
  Administered 2018-03-27 – 2018-04-01 (×3): 3 mL via RESPIRATORY_TRACT
  Filled 2018-03-25 (×3): qty 3

## 2018-03-25 MED ORDER — FENTANYL CITRATE (PF) 100 MCG/2ML IJ SOLN
50.0000 ug | Freq: Once | INTRAMUSCULAR | Status: AC
  Administered 2018-03-25: 50 ug via INTRAVENOUS

## 2018-03-25 MED ORDER — GABAPENTIN 600 MG PO TABS
1200.0000 mg | ORAL_TABLET | Freq: Every day | ORAL | Status: DC
  Administered 2018-03-25: 1200 mg via ORAL
  Filled 2018-03-25: qty 2

## 2018-03-25 MED ORDER — SODIUM CHLORIDE 0.9 % IV SOLN
2.0000 g | Freq: Once | INTRAVENOUS | Status: AC
  Administered 2018-03-26: 2 g via INTRAVENOUS
  Filled 2018-03-25: qty 2

## 2018-03-25 MED ORDER — CLOPIDOGREL BISULFATE 75 MG PO TABS
75.0000 mg | ORAL_TABLET | Freq: Every day | ORAL | Status: DC
  Administered 2018-03-26 – 2018-03-27 (×2): 75 mg via NASOGASTRIC
  Filled 2018-03-25 (×2): qty 1

## 2018-03-25 MED ORDER — PROPOFOL 1000 MG/100ML IV EMUL
INTRAVENOUS | Status: AC
  Filled 2018-03-25: qty 100

## 2018-03-25 MED ORDER — PROPOFOL 1000 MG/100ML IV EMUL
INTRAVENOUS | Status: AC | PRN
  Administered 2018-03-25: 30 ug/kg/min via INTRAVENOUS

## 2018-03-25 MED ORDER — NITROGLYCERIN 0.4 MG SL SUBL
SUBLINGUAL_TABLET | SUBLINGUAL | Status: AC
  Filled 2018-03-25: qty 2

## 2018-03-25 MED ORDER — LEVOFLOXACIN IN D5W 750 MG/150ML IV SOLN
750.0000 mg | INTRAVENOUS | Status: DC
  Administered 2018-03-26 – 2018-03-27 (×2): 750 mg via INTRAVENOUS
  Filled 2018-03-25 (×3): qty 150

## 2018-03-25 MED ORDER — SUCCINYLCHOLINE CHLORIDE 20 MG/ML IJ SOLN
INTRAMUSCULAR | Status: AC | PRN
  Administered 2018-03-25: 150 mg via INTRAVENOUS

## 2018-03-25 MED ORDER — BUPROPION HCL ER (XL) 150 MG PO TB24: 150.0000 mg | ORAL_TABLET | Freq: Every day | ORAL | Status: DC

## 2018-03-25 MED ORDER — NITROGLYCERIN IN D5W 200-5 MCG/ML-% IV SOLN
0.0000 ug/min | INTRAVENOUS | Status: DC
  Administered 2018-03-25: 50 ug/min via INTRAVENOUS
  Filled 2018-03-25: qty 250

## 2018-03-25 MED ORDER — FUROSEMIDE 10 MG/ML IJ SOLN
40.0000 mg | Freq: Once | INTRAMUSCULAR | Status: AC
  Administered 2018-03-25: 40 mg via INTRAVENOUS
  Filled 2018-03-25: qty 4

## 2018-03-25 MED ORDER — PANTOPRAZOLE SODIUM 40 MG PO TBEC
40.0000 mg | DELAYED_RELEASE_TABLET | Freq: Two times a day (BID) | ORAL | Status: DC
  Administered 2018-03-25: 40 mg via ORAL
  Filled 2018-03-25: qty 1

## 2018-03-25 MED ORDER — VITAMIN D 25 MCG (1000 UNIT) PO TABS
2000.0000 [IU] | ORAL_TABLET | Freq: Every day | ORAL | Status: DC
  Administered 2018-03-26 – 2018-03-31 (×6): 2000 [IU] via ORAL
  Filled 2018-03-25 (×6): qty 2

## 2018-03-25 MED ORDER — HEPARIN (PORCINE) 25000 UT/250ML-% IV SOLN
1050.0000 [IU]/h | INTRAVENOUS | Status: DC
  Administered 2018-03-25: 1300 [IU]/h via INTRAVENOUS
  Administered 2018-03-26: 1250 [IU]/h via INTRAVENOUS
  Filled 2018-03-25 (×2): qty 250

## 2018-03-25 MED ORDER — NITROGLYCERIN 0.4 MG SL SUBL
0.4000 mg | SUBLINGUAL_TABLET | SUBLINGUAL | Status: DC | PRN
  Administered 2018-03-25: 0.4 mg via SUBLINGUAL

## 2018-03-25 MED ORDER — RANOLAZINE ER 500 MG PO TB12
1000.0000 mg | ORAL_TABLET | Freq: Two times a day (BID) | ORAL | Status: DC
  Administered 2018-03-25: 1000 mg via ORAL
  Filled 2018-03-25 (×2): qty 2

## 2018-03-25 MED ORDER — FENTANYL BOLUS VIA INFUSION
25.0000 ug | INTRAVENOUS | Status: DC | PRN
  Administered 2018-03-27: 25 ug via INTRAVENOUS
  Filled 2018-03-25: qty 25

## 2018-03-25 MED ORDER — ROSUVASTATIN CALCIUM 20 MG PO TABS
40.0000 mg | ORAL_TABLET | Freq: Every day | ORAL | Status: DC
  Administered 2018-03-25 – 2018-03-30 (×6): 40 mg via NASOGASTRIC
  Filled 2018-03-25 (×6): qty 2

## 2018-03-25 MED ORDER — CLONIDINE HCL 0.2 MG PO TABS: 0.2000 mg | ORAL_TABLET | Freq: Two times a day (BID) | ORAL | Status: DC

## 2018-03-25 MED ORDER — ACETAMINOPHEN 500 MG PO TABS
1000.0000 mg | ORAL_TABLET | Freq: Once | ORAL | Status: AC
  Administered 2018-03-25: 1000 mg via ORAL
  Filled 2018-03-25: qty 2

## 2018-03-25 MED ORDER — FENTANYL CITRATE (PF) 100 MCG/2ML IJ SOLN: 50.0000 ug | INTRAMUSCULAR | Status: DC | PRN

## 2018-03-25 MED ORDER — CLONIDINE HCL 0.1 MG PO TABS: 0.1000 mg | ORAL_TABLET | Freq: Two times a day (BID) | ORAL | Status: DC

## 2018-03-25 MED ORDER — VITAMIN B-12 1000 MCG PO TABS
1000.0000 ug | ORAL_TABLET | Freq: Every day | ORAL | Status: DC
  Administered 2018-03-26 – 2018-04-10 (×14): 1000 ug via ORAL
  Filled 2018-03-25 (×14): qty 1

## 2018-03-25 MED ORDER — FUROSEMIDE 10 MG/ML IJ SOLN
160.0000 mg | Freq: Two times a day (BID) | INTRAVENOUS | Status: DC
  Filled 2018-03-25: qty 16

## 2018-03-25 MED ORDER — ETOMIDATE 2 MG/ML IV SOLN
INTRAVENOUS | Status: AC | PRN
  Administered 2018-03-25: 20 mg via INTRAVENOUS

## 2018-03-25 MED ORDER — EZETIMIBE 10 MG PO TABS
10.0000 mg | ORAL_TABLET | Freq: Every day | ORAL | Status: DC
  Administered 2018-03-25 – 2018-03-30 (×6): 10 mg
  Filled 2018-03-25 (×7): qty 1

## 2018-03-25 MED ORDER — DULOXETINE HCL 30 MG PO CPEP
30.0000 mg | ORAL_CAPSULE | Freq: Every day | ORAL | Status: DC
  Filled 2018-03-25 (×2): qty 1

## 2018-03-25 MED ORDER — PROSIGHT PO TABS
1.0000 | ORAL_TABLET | Freq: Every day | ORAL | Status: DC
  Administered 2018-03-26 – 2018-04-10 (×14): 1 via ORAL
  Filled 2018-03-25 (×15): qty 1

## 2018-03-25 MED ORDER — FENTANYL 2500MCG IN NS 250ML (10MCG/ML) PREMIX INFUSION
25.0000 ug/h | INTRAVENOUS | Status: DC
  Administered 2018-03-25: 50 ug/h via INTRAVENOUS
  Administered 2018-03-26 – 2018-03-27 (×2): 100 ug/h via INTRAVENOUS
  Filled 2018-03-25 (×2): qty 250

## 2018-03-25 MED ORDER — BISACODYL 10 MG RE SUPP: 10.0000 mg | Freq: Every day | RECTAL | Status: DC | PRN

## 2018-03-25 MED ORDER — DOCUSATE SODIUM 50 MG/5ML PO LIQD: 100.0000 mg | Freq: Two times a day (BID) | ORAL | Status: DC | PRN

## 2018-03-25 MED ORDER — INSULIN GLARGINE 100 UNIT/ML ~~LOC~~ SOLN
25.0000 [IU] | Freq: Every day | SUBCUTANEOUS | Status: DC
  Administered 2018-03-26 – 2018-04-01 (×6): 25 [IU] via SUBCUTANEOUS
  Filled 2018-03-25 (×8): qty 0.25

## 2018-03-25 MED ORDER — GABAPENTIN 600 MG PO TABS
600.0000 mg | ORAL_TABLET | ORAL | Status: DC
  Administered 2018-03-26: 600 mg via ORAL
  Filled 2018-03-25: qty 1

## 2018-03-25 MED ORDER — LORAZEPAM 2 MG/ML IJ SOLN
0.5000 mg | Freq: Once | INTRAMUSCULAR | Status: AC
  Administered 2018-03-25: 0.5 mg via INTRAVENOUS
  Filled 2018-03-25: qty 1

## 2018-03-25 MED ORDER — PROPOFOL 1000 MG/100ML IV EMUL
5.0000 ug/kg/min | INTRAVENOUS | Status: DC
  Administered 2018-03-25: 30 ug/kg/min via INTRAVENOUS

## 2018-03-25 MED ORDER — HEPARIN BOLUS VIA INFUSION
4000.0000 [IU] | Freq: Once | INTRAVENOUS | Status: AC
  Administered 2018-03-25: 4000 [IU] via INTRAVENOUS
  Filled 2018-03-25: qty 4000

## 2018-03-25 MED ORDER — ARFORMOTEROL TARTRATE 15 MCG/2ML IN NEBU
15.0000 ug | INHALATION_SOLUTION | Freq: Two times a day (BID) | RESPIRATORY_TRACT | Status: DC
  Administered 2018-03-25 – 2018-04-01 (×14): 15 ug via RESPIRATORY_TRACT
  Filled 2018-03-25 (×14): qty 2

## 2018-03-25 MED ORDER — AMIODARONE HCL 200 MG PO TABS
100.0000 mg | ORAL_TABLET | Freq: Every day | ORAL | Status: DC
  Administered 2018-03-25 – 2018-03-31 (×6): 100 mg via NASOGASTRIC
  Filled 2018-03-25 (×6): qty 1

## 2018-03-25 MED ORDER — ALPRAZOLAM 0.5 MG PO TABS
0.5000 mg | ORAL_TABLET | Freq: Three times a day (TID) | ORAL | Status: DC | PRN
  Administered 2018-03-29: 0.5 mg via ORAL
  Filled 2018-03-25: qty 1

## 2018-03-25 MED ORDER — BUDESONIDE 0.25 MG/2ML IN SUSP
0.2500 mg | Freq: Two times a day (BID) | RESPIRATORY_TRACT | Status: DC
  Administered 2018-03-25 – 2018-04-10 (×31): 0.25 mg via RESPIRATORY_TRACT
  Filled 2018-03-25 (×36): qty 2

## 2018-03-25 MED ORDER — PROPOFOL 10 MG/ML IV BOLUS
INTRAVENOUS | Status: AC
  Filled 2018-03-25: qty 20

## 2018-03-25 MED ORDER — INSULIN ASPART 100 UNIT/ML ~~LOC~~ SOLN
2.0000 [IU] | SUBCUTANEOUS | Status: DC
  Administered 2018-03-25 – 2018-03-26 (×2): 4 [IU] via SUBCUTANEOUS
  Administered 2018-03-26: 2 [IU] via SUBCUTANEOUS
  Administered 2018-03-26: 4 [IU] via SUBCUTANEOUS
  Administered 2018-03-26 – 2018-03-27 (×5): 2 [IU] via SUBCUTANEOUS
  Administered 2018-03-27: 4 [IU] via SUBCUTANEOUS
  Administered 2018-03-28 (×3): 2 [IU] via SUBCUTANEOUS
  Administered 2018-03-28: 4 [IU] via SUBCUTANEOUS

## 2018-03-25 NOTE — H&P (Signed)
NAME:  [name] [name], [name]:  [id number], DOB:  Jul 04, 1945, LOS: 0 ADMISSION DATE:  03/25/2018, CONSULTATION DATE:  03/25/2018 REFERRING MD:  ED physician, CHIEF COMPLAINT:  Acute hypoxic respiratory failure   Brief History   73 year old woman with history of asthma, OSA not on CPAP, chronic hypoxic respiratory failure on 2L at night, CAD s/p CABG and multiple PCI, HFpEF s/p CardioMEMS, AF on Eliquis, cerebrovascular disease, HTN, CKD, DM2, PAD presenting with cough found in the ED to have fever, hypertensive urgency, and acute on chronic hypoxic respiratory failure. She was intubated in the ED and given IV Lasix, aztreonam and NTG gtt.  History of present illness   73 year old woman with history of asthma, OSA not on CPAP, chronic hypoxic respiratory failure on 2L at night, CAD s/p CABG and multiple PCI, HFpEF s/p CardioMEMS, AF on Eliquis, cerebrovascular disease, HTN, CKD, DM2, PAD presenting with cough. History obtained from daughter, husband, and EMR due to intubation and sedation. She has been feeling poorly for the past week with cough productive of sputum. She has been having intermittent emesis and post tussive emesis. She has not been able to take her medications because of her emesis. She had a fall yesterday because of weakness. She was noted to be hypoxic to the 70s this afternoon when her daughter checked.   She lives at home with her husband. She is only able to walk a few feet before she gets dyspneic. She just recently got a rollator to help with mobility. Her husband is her HCPOA.  In the ED, pt febrile to 102.6 with SBP 170-209, HR 70s, O2 in the mid 80s. ECG showed sinus rhythm with LBBB-like IVCD. VBG 7.38/50. Labs included Cr 1.34 (at/improved from baseline), BNP 1480 (last 701 in 01/2018), troponin 0.05. Lactate 1.8. WBC 9.4. Flu panel negative. CXR showed cardiomegaly and bilateral infiltrates. She was intubated in the ED and started on IV aztreonam. IV furosemide was given, and  NTG infusion was started for BP control.   Past Medical History  asthma, OSA not on CPAP, chronic hypoxic respiratory failure on 2L at night, CAD s/p CABG and multiple PCI, HFpEF s/p CardioMEMS, AF on Eliquis, cerebrovascular disease, HTN, CKD, DM2, PAD  Significant Hospital Events   2/22 > admit, intubated  Consults:  Cardiology 2/22  Procedures:  None  Significant Diagnostic Tests:  CXR 2/22 > Cardiomegaly. Worsening bilateral airspace disease  Micro Data:  BCx x 2 2/22 >> Resp cx 2/22 >>  UCx 2/22 >> Influenza PCR 2/22 >> neg  Antimicrobials:  Aztreonam 2/22 Levaquin 2/22 >>  Interim history/subjective:  Intubated/sedated  Objective   Blood pressure (!) 119/59, pulse 68, temperature (!) 102.6 F (39.2 C), temperature source Rectal, resp. rate 14, height 5' 2.5" (1.588 m), weight (!) 139.7 kg, SpO2 98 %.    Vent Mode: PRVC FiO2 (%):  [80 %] 80 % Set Rate:  [14 bmp] 14 bmp Vt Set:  [550 mL] 550 mL PEEP:  [5 cmH20] 5 cmH20 Plateau Pressure:  [39 cmH20] 39 cmH20  No intake or output data in the 24 hours ending 03/25/18 2116 Filed Weights   03/25/18 1546  Weight: (!) 139.7 kg    Examination: General: NAD HENT: /AT, ETT in place Lungs: Crackles bilaterally Cardiovascular: RRR Abdomen: Soft, nondistended, nontender Extremities: BLE 2+ pitting edema Neuro: Sedated GU: Foley in place  Assessment & Plan:  73 year old woman with history of asthma, OSA not on CPAP, chronic hypoxic respiratory failure on 2L at night, CAD  s/p CABG and multiple PCI, HFpEF s/p CardioMEMS, AF on Eliquis, cerebrovascular disease, HTN, CKD, DM2, PAD presenting with cough found in the ED to have fever, hypertensive urgency, and acute on chronic hypoxic respiratory failure.   Acute on chronic hypoxic respiratory failure, acute on chronic HFpEF: BNP increased to 1480 from 701 2 months ago. Dry weight appears to be 295 lbs and today she is 308. --Cardiology following, appreciate  recs --Lasix 160mg  IV BID per cardiology --Strict in/out, daily weights --Continue vent support and SBT/SAT in AM  Possible CAP, asthma: Fever to 102.6 in ED. No leukocytosis. Influenza PCR neg.  --Continue home inhaler as Brovana and Pulmicort nebs BID --Switch to Levaquin for possible CAP --Obtain resp culture, follow up blood cultures and urine culture  Hypertensive urgency, lactic acidosis: BP up to 209/123 (MAP 152) in ED. Dropped as low as 84/45 (MAP 58) after patient was intubated and sedated. Off NTG gtt now. --Holding home antihypertensives given drop in blood pressure.  --Continued clonidine to start tomorrow as a lower dose to avoid rebound --Switch from propofol to fent gtt for sedation  --Recheck LA --Mild trop leak likely from demand ischemia. Trend trops.  CAD s/p CABG and multiple PCI: Continue home plavix, Zetia, statin, Ranexa  AF on Eliquis: continue home amio. Hold home Eliquis and continue heparin gtt.  CKD3: Cr below baseline of 1.7.  DM2:  --Continue home gabapentin and renally adjusted dose --Continue home Lantus at reduced dose, SSI with monitoring  Best practice:  Diet: NPO Pain/Anxiety/Delirium protocol (if indicated): fent gtt and prn VAP protocol (if indicated): ordered DVT prophylaxis: hep gtt GI prophylaxis: PPI Glucose control: SSI Mobility: OOB Code Status: FULL Family Communication: Discussed at bedside with daughter and husband Disposition: ICU  Labs   CBC: Recent Labs  Lab 03/25/18 1630 03/25/18 1645  WBC 9.4  --   NEUTROABS 8.1*  --   HGB 13.4 14.3  HCT 43.9 42.0  MCV 101.4*  --   PLT 104*  --     Basic Metabolic Panel: Recent Labs  Lab 03/20/18 1220 03/25/18 1630 03/25/18 1645  NA 142 138 139  K 4.3 4.1 4.1  CL 100 101  --   CO2 31 26  --   GLUCOSE 106* 166*  --   BUN 26* 15  --   CREATININE 1.73* 1.34*  --   CALCIUM 9.1 9.1  --   MG  --  2.0  --   PHOS  --  2.8  --    GFR: Estimated Creatinine Clearance:  53.5 mL/min (A) (by C-G formula based on SCr of 1.34 mg/dL (H)). Recent Labs  Lab 03/25/18 1630  WBC 9.4  LATICACIDVEN 1.8    Liver Function Tests: Recent Labs  Lab 03/25/18 1630  AST 38  ALT 22  ALKPHOS 72  BILITOT 1.5*  PROT 7.3  ALBUMIN 3.9   Recent Labs  Lab 03/25/18 1630  LIPASE 19   ABG    Component Value Date/Time   PHART 7.341 (L) 01/28/2010 1651   PCO2ART 44.1 01/28/2010 1651   PO2ART 113.0 (H) 01/28/2010 1651   HCO3 30.0 (H) 03/25/2018 1645   TCO2 32 03/25/2018 1645   ACIDBASEDEF 2.0 01/28/2010 1651   O2SAT 76.0 03/25/2018 1645     Coagulation Profile: Recent Labs  Lab 03/25/18 1630  INR 1.60    Cardiac Enzymes: Recent Labs  Lab 03/25/18 1630  TROPONINI 0.05*    HbA1C: Hgb A1c MFr Bld  Date/Time Value Ref Range  Status  10/21/2017 05:12 AM 8.0 (H) 4.8 - 5.6 % Final    Comment:    (NOTE) Pre diabetes:          5.7%-6.4% Diabetes:              >6.4% Glycemic control for   <7.0% adults with diabetes   07/18/2013 08:16 PM 7.2 (H) <5.7 % Final    Comment:    (NOTE)                                                                       According to the ADA Clinical Practice Recommendations for 2011, when HbA1c is used as a screening test:  >=6.5%   Diagnostic of Diabetes Mellitus           (if abnormal result is confirmed) 5.7-6.4%   Increased risk of developing Diabetes Mellitus References:Diagnosis and Classification of Diabetes Mellitus,Diabetes TWSF,6812,75(TZGYF 1):S62-S69 and Standards of Medical Care in         Diabetes - 2011,Diabetes VCBS,4967,59 (Suppl 1):S11-S61.    Review of Systems:   Unable to obtain due to sedation  Past Medical History  She,  has a past medical history of Abnormality of gait (05/14/2014), Anemia, Anginal pain ([place]), Anxiety, Asthma, Basal cell carcinoma (05/2014), Bundle branch block, left, CHF (congestive heart failure) ([place]), Chronic bronchitis ([place]), Chronic insomnia (05/06/2015), Chronic kidney disease,  Chronic low back pain (08/24/2016), Chronic lower back pain, Claustrophobia, Common migraine (05/14/2014), Coronary artery disease, Depression, Dysrhythmia, GERD (gastroesophageal reflux disease), H/O hiatal hernia, Headache, Heart murmur, Hyperlipidemia, Hypertension, Irregular heart beat, Leg cramps, Melanoma ([place]) (05/2014), Memory change (05/14/2014), Migraine, Myocardial infarction ([place]) (04/1999, 02/2000, 01/2005; 2011; 2014), Neuromuscular disorder ([place]), Obesity (01-2010), Obstructive sleep apnea, On home oxygen therapy, Osteoarthritis, Other and unspecified angina pectoris, Peripheral vascular disease ([place]), Pneumonia (2000's), PONV (postoperative nausea and vomiting), Shortness of breath, Stroke ([place]) (03/22/12), Stroke [facility]) (Oct. 2015), Stroke [facility]) (10/2014), Type II diabetes mellitus ([place]), Ventral hernia, Vomiting, and Vomiting blood.   Surgical History    Past Surgical History:  Procedure Laterality Date  . ANKLE FRACTURE SURGERY Left 1970's  . APPENDECTOMY  1970's   w/hysterectomy  . BASAL CELL CARCINOMA EXCISION Left 05/2014   "shoulder" (01/16/2018)  . CARDIAC CATHETERIZATION  10/10/2012   Dr [name].  Marland Kitchen CARDIAC CATHETERIZATION N/A 05/29/2015   Procedure: Right/Left Heart Cath and Coronary/Graft Angiography;  Surgeon: Larey Dresser, MD;  Location: [facility];  Service: Cardiovascular;  Laterality: N/A;  . CAROTID ENDARTERECTOMY Left 03/2013  . CAROTID STENT INSERTION Left 03/20/2013   Procedure: CAROTID STENT INSERTION;  Surgeon: [name], MD;  Location: [facility] CATH LAB;  Service: Cardiovascular;  Laterality: Left;  internal carotid  . CEREBRAL ANGIOGRAM N/A 04/05/2011   Procedure: CEREBRAL ANGIOGRAM;  Surgeon: Angelia Mould, MD;  Location: [facility] CATH LAB;  Service: Cardiovascular;  Laterality: N/A;  . CHOLECYSTECTOMY OPEN  2004  . CORONARY ANGIOPLASTY WITH STENT PLACEMENT  01,02,05,06,07,08,11; 04/24/2013   "I've probably got ~ 10 stents by now" (04/24/2013)  . CORONARY  ANGIOPLASTY WITH STENT PLACEMENT  06/13/2013   "got 4 stents today" (06/13/2013)  . CORONARY ARTERY BYPASS GRAFT  1220/11   "CABG X5"  . CORONARY STENT INTERVENTION N/A 10/20/2017  Procedure: CORONARY STENT INTERVENTION;  Surgeon: Troy Sine, MD;  Location: [facility];  Service: Cardiovascular;  Laterality: N/A;  . ESOPHAGOGASTRODUODENOSCOPY  08/03/2011   Procedure: ESOPHAGOGASTRODUODENOSCOPY (EGD);  Surgeon: Shann Medal, MD;  Location: [facility];  Service: General;  Laterality: N/A;  . ESOPHAGOGASTRODUODENOSCOPY (EGD) WITH PROPOFOL N/A 03/11/2014   Procedure: ESOPHAGOGASTRODUODENOSCOPY (EGD) WITH PROPOFOL;  Surgeon: Lafayette Dragon, MD;  Location: [facility] ENDOSCOPY;  Service: Endoscopy;  Laterality: N/A;  . FRACTURE SURGERY    . gall stone removal  05/2003  . GASTRIC RESTRICTION SURGERY  1984   "stapeling"  . HERNIA REPAIR  2004   "in my stomach; had OR on it twice", wire mesh on 1 hernia  . LEFT HEART CATH AND CORS/GRAFTS ANGIOGRAPHY N/A 07/09/2016   Procedure: LEFT HEART CATH AND CORS/GRAFTS ANGIOGRAPHY;  Surgeon: Larey Dresser, MD;  Location: [place] [facility];  Service: Cardiovascular;  Laterality: N/A;  . OVARY SURGERY  1970's   "tumor removed"  . PERCUTANEOUS CORONARY STENT INTERVENTION (PCI-S) N/A 06/13/2013   Procedure: PERCUTANEOUS CORONARY STENT INTERVENTION (PCI-S);  Surgeon: [name], MD;  Location: [facility] CATH LAB;  Service: Cardiovascular;  Laterality: N/A;  . PERCUTANEOUS STENT INTERVENTION N/A 04/24/2013   Procedure: PERCUTANEOUS STENT INTERVENTION;  Surgeon: [name], MD;  Location: [facility] CATH LAB;  Service: Cardiovascular;  Laterality: N/A;  . RIGHT/LEFT HEART CATH AND CORONARY/GRAFT ANGIOGRAPHY N/A 10/20/2017   Procedure: RIGHT/LEFT HEART CATH AND CORONARY/GRAFT ANGIOGRAPHY;  Surgeon: Larey Dresser, MD;  Location: [facility];  Service: Cardiovascular;  Laterality: N/A;  . ROOT CANAL  10/2000  . TIBIA FRACTURE SURGERY Right 1970's   rods and  pins  . TOOTH EXTRACTION     "1 on the upper; wisdom tooth on the lower" (01/16/2018)  . TOTAL ABDOMINAL HYSTERECTOMY  1970's   w/ appendectomy     Social History   reports that she has never smoked. She has never used smokeless tobacco. She reports that she does not drink alcohol or use drugs.   Family History   Her family history includes AAA (abdominal aortic aneurysm) in her father and mother; Cancer in her sister; Deep vein thrombosis in her father and mother; Diabetes in her father, paternal aunt, paternal grandmother, paternal uncle, and paternal uncle; Heart attack in her father; Heart attack (age of onset: 54) in her paternal grandfather; Heart disease in her father and paternal uncle; Hyperlipidemia in her father; Hypertension in her father, mother, and sister. There is no history of Colon cancer.   Allergies Allergies  Allergen Reactions  . Amoxicillin Shortness Of Breath and Rash  . Brilinta [Ticagrelor] Shortness Of Breath  . Erythromycin Shortness Of Breath, Other (See Comments) and Hives    Trouble swallowing  . Flagyl [Metronidazole] Palpitations and Shortness Of Breath  . Penicillins Hives, Shortness Of Breath, Rash and Other (See Comments)    Has patient had a PCN reaction causing immediate rash, facial/tongue/throat swelling, SOB or lightheadedness with hypotension: Yes Has patient had a PCN reaction causing severe rash involving mucus membranes or skin necrosis: No Has patient had a PCN reaction that required hospitalization: Yes Has patient had a PCN reaction occurring within the last 10 years: No If all of the above answers are "NO", then may proceed with Cephalosporin use.   . Isosorbide Mononitrate [Isosorbide Nitrate] Other (See Comments)    Joint aches, muscles hurt, difficult to walk   . Jardiance [Empagliflozin] Other (See Comments)    Nausea, joint  aches, muscles aches  . Metformin And Related Other (See Comments)    Stomach pain, cold sweats, joint  pain, burred vision, dizziness  . Tape Other (See Comments)    Skin pulls off with certain types Plastic tape causes skin to rip if left on for long periods of time  . Erythromycin Base Rash     Home Medications  Prior to Admission medications   Medication Sig Start Date End Date Taking? Authorizing Provider  acetaminophen (TYLENOL) 500 MG tablet Take 1,000 mg by mouth every 6 (six) hours as needed for mild pain or headache.    Yes [provider]  albuterol (PROVENTIL) (2.5 MG/3ML) 0.083% nebulizer solution Take 2.5 mg by nebulization every 4 (four) hours as needed for wheezing or shortness of breath.    Yes [provider]  albuterol (PROVENTIL,VENTOLIN) 90 MCG/ACT inhaler Inhale 2 puffs into the lungs every 4 (four) hours as needed for wheezing or shortness of breath.    Yes [provider]  Alirocumab (PRALUENT) 150 MG/ML SOPN Inject 1 pen into the skin every 14 (fourteen) days. 04/07/16  Yes Larey Dresser, MD  ALPRAZolam Duanne Moron) 0.5 MG tablet Take 0.5-1 mg by mouth See admin instructions. Take 0.5 mg by mouth in the morning. Take 0.5 mg by mouth at noon if needed for anxiety. Take 1 mg by mouth at bedtime.   Yes [provider]  amiodarone (PACERONE) 100 MG tablet Take 1 tablet (100 mg total) by mouth daily. 02/08/18  Yes Georgiana Shore, NP  amLODipine (NORVASC) 10 MG tablet Take 10 mg by mouth at bedtime.  01/20/15  Yes [provider]  apixaban (ELIQUIS) 5 MG TABS tablet Take 1 tablet (5 mg total) by mouth 2 (two) times daily. 02/08/18  Yes Georgiana Shore, NP  b complex vitamins capsule Take 1 capsule by mouth daily.   Yes [provider]  buPROPion (WELLBUTRIN XL) 150 MG 24 hr tablet Take 150 mg by mouth daily.   Yes [provider]  Calcium Carbonate 1500 (600 CA) MG TABS Take 1,500 mg by mouth daily with breakfast.   Yes [provider]  Cholecalciferol (VITAMIN D) 2000 UNITS tablet Take 2,000 Units by mouth daily  with breakfast.    Yes [provider]  cloNIDine (CATAPRES) 0.2 MG tablet Take 1 tablet (0.2 mg total) by mouth 2 (two) times daily. 11/03/17 11/03/18 Yes Clegg, Amy D, NP  clopidogrel (PLAVIX) 75 MG tablet TAKE ONE (1) TABLET BY MOUTH EVERY DAY 02/09/18  Yes Bensimhon, Shaune Pascal, MD  denosumab (PROLIA) 60 MG/ML SOLN injection Inject 60 mg into the skin every 6 (six) months. Administer in upper arm, thigh, or abdomen   Yes [provider]  DULoxetine (CYMBALTA) 30 MG capsule Take 1 capsule (30 mg total) by mouth 2 (two) times daily. Patient taking differently: Take 30 mg by mouth daily.  07/15/17  Yes Kathrynn Ducking, MD  ezetimibe (ZETIA) 10 MG tablet TAKE ONE (1) TABLET EACH DAY Patient taking differently: Take 10 mg by mouth at bedtime.  07/16/16  Yes Bensimhon, Shaune Pascal, MD  Fluticasone-Salmeterol (ADVAIR DISKUS) 250-50 MCG/DOSE AEPB Inhale 1 puff into the lungs 2 (two) times daily.    Yes [provider]  gabapentin (NEURONTIN) 600 MG tablet Take 600-1,200 mg by mouth See admin instructions. Take 600 mg by mouth in the morning and take 600 mg by mouth in the afternoon. Take 1200 mg by mouth at bedtime.   Yes [provider]  HUMALOG KWIKPEN 100 UNIT/ML SOPN Inject 18-26 Units into the skin 3 (three) times daily before meals. Per sliding scale 09/19/12  Yes [provider]  HYDROcodone-acetaminophen (NORCO/VICODIN) 5-325 MG per tablet Take 1 tablet by mouth at bedtime.    Yes [provider]  ipratropium-albuterol (DUONEB) 0.5-2.5 (3) MG/3ML SOLN Take 3 mLs by nebulization every 4 (four) hours as needed (for severe shortness of breath or wheezing).    Yes [provider]  loperamide (IMODIUM) 2 MG capsule Take 2 mg by mouth 2 (two) times daily as needed for diarrhea or loose stools.    Yes [provider]  losartan (COZAAR) 100 MG tablet Take 100 mg by mouth daily.   Yes [provider]  magnesium oxide (MAG-OX) 400 MG  tablet Take 400 mg by mouth 2 (two) times daily.   Yes [provider]  methocarbamol (ROBAXIN) 500 MG tablet TAKE ONE (1) TABLET THREE (3) TIMES EACH DAY 08/24/16  Yes Kathrynn Ducking, MD  metolazone (ZAROXOLYN) 2.5 MG tablet Take 1 tablet (2.5 mg total) by mouth once a week. Take 1 Tablet once weekly on Mondays. Patient taking differently: Take 2.5 mg by mouth every Monday.  11/22/17  Yes Larey Dresser, MD  metoprolol succinate (TOPROL-XL) 25 MG 24 hr tablet Take 1 tablet (25 mg total) by mouth daily. 01/20/18  Yes Clegg, Amy D, NP  Multiple Vitamin (MULTIVITAMIN) capsule Take 1 capsule by mouth daily.   Yes [provider]  multivitamin-lutein (OCUVITE-LUTEIN) CAPS capsule Take 1 capsule by mouth daily.   Yes [provider]  nitroGLYCERIN (NITROSTAT) 0.3 MG SL tablet Place 1 tablet (0.3 mg total) under the tongue every 5 (five) minutes x 3 doses as needed for chest pain. 09/15/17  Yes Georgiana Shore, NP  ondansetron (ZOFRAN) 4 MG tablet Take 1 tablet (4 mg total) by mouth 2 (two) times daily as needed. For nausea Patient taking differently: Take 4 mg by mouth 2 (two) times daily.  02/12/14  Yes Lafayette Dragon, MD  OXYGEN Inhale 2 L into the lungs at bedtime.    Yes [provider]  pantoprazole (PROTONIX) 40 MG tablet Take 1 tablet (40 mg total) by mouth 2 (two) times daily. 04/26/16  Yes Irene Shipper, MD  Polyvinyl Alcohol-Povidone PF (REFRESH) 1.4-0.6 % SOLN Place 2 drops into both eyes 2 (two) times daily as needed (for dry eyes).   Yes [provider]  potassium chloride SA (K-DUR,KLOR-CON) 20 MEQ tablet Take 1 tablet (20 mEq total) by mouth 2 (two) times daily. 12/14/17  Yes Larey Dresser, MD  ranolazine (RANEXA) 1000 MG SR tablet Take 1 tablet (1,000 mg total) by mouth 2 (two) times daily. 02/08/18  Yes Georgiana Shore, NP  rosuvastatin (CRESTOR) 40 MG tablet TAKE ONE (1) TABLET EACH DAY Patient taking differently: Take 40 mg by mouth at  bedtime.  12/10/16  Yes Bensimhon, Shaune Pascal, MD  torsemide (DEMADEX) 20 MG tablet Take 5 tablets (100 mg total) by mouth 2 (two) times daily. 01/19/18 03/25/18 Yes Clegg, Amy D, NP  triamcinolone cream (KENALOG) 0.1 % Apply 1 application topically 2 (two) times daily.    Yes [provider]  vitamin B-12 (CYANOCOBALAMIN) 1000 MCG tablet Take 1,000 mcg by mouth daily.   Yes [provider]  zinc gluconate 50 MG tablet Take 50 mg by mouth daily.    Yes [provider]  zolpidem (AMBIEN) 10 MG tablet Take 10 mg by  mouth at bedtime.    Yes [provider]  Insulin Glargine (LANTUS SOLOSTAR) 100 UNIT/ML Solostar Pen Inject 50 Units into the skin at bedtime.     [provider]     Critical care time: The patient is critically ill with multiple organ systems failure and requires high complexity decision making for assessment and support, frequent evaluation and titration of therapies, application of advanced monitoring technologies and extensive interpretation of multiple databases.   Critical Care Time devoted to patient care services described in this note is  45 Minutes. This time reflects time of care of this signee. This critical care time does not reflect procedure time, or teaching time or supervisory time of PA/NP/Med student/Med Resident etc but could involve care discussion time.  Jacques Earthly, M.D. [facility] Pulmonary/Critical Care Medicine After hours pager: [phone].

## 2018-03-25 NOTE — ED Notes (Addendum)
PureWick placed.

## 2018-03-25 NOTE — Procedures (Signed)
Intubation Procedure Note AMORI COOPERMAN [id number] 06-13-1945  Procedure: Intubation Indications: Respiratory insufficiency  Procedure Details Consent: Risks of procedure as well as the alternatives and risks of each were explained to the (patient/caregiver).  Consent for procedure obtained. Time Out: Verified patient identification, verified procedure, site/side was marked, verified correct patient position, special equipment/implants available, medications/allergies/relevent history reviewed, required imaging and test results available.  Performed  Maximum sterile technique was used including hand hygiene.  MAC    Evaluation Hemodynamic Status: BP stable throughout; O2 sats: stable throughout Patient's Current Condition: stable Complications: No apparent complications Patient did tolerate procedure well. Chest X-ray ordered to verify placement.  CXR: pending.   Chriss Driver [facility] 03/25/2018

## 2018-03-25 NOTE — ED Provider Notes (Signed)
[facility] EMERGENCY DEPARTMENT Provider Note   CSN: [id number] Arrival date & time: 03/25/18  1523    History   Chief Complaint Chief Complaint  Patient presents with  . Emesis    HPI [name] [name] is a 73 y.o. female.     HPI Patient with complex medical history including diabetes, congestive heart failure, hypertension, obstructive sleep apnea.  Patient reports she has been sick for almost 2 weeks.  She reports she has had cough and chest congestion.  For 1 week she has had episodes of vomiting.  This is intermittent.  No associated diarrhea.  Some of the vomiting is associated with harsh coughing paroxysmal's.  Patient reports for 2 days she has not been able to take any of her medications.  She has not been able to take any of her antihypertensive medications and she has not taken her insulin.  She reports that now she is very weak and feels achy and pain all over.  She reports that last night she was trying to get in her tub and got weak and fell down onto her knees.  She reports both knees now are aching from the fall. Past Medical History:  Diagnosis Date  . Abnormality of gait 05/14/2014  . Anemia    hx  . Anginal pain ([place])   . Anxiety   . Asthma   . Basal cell carcinoma 05/2014   "left shoulder"  . Bundle branch block, left    chronic/notes 07/18/2013  . CHF (congestive heart failure) ([place])   . Chronic bronchitis ([place])    "off and on all the time" (07/18/2013)  . Chronic insomnia 05/06/2015  . Chronic kidney disease    frequency, sees dr Jamal Maes every 4 to 6 months (01/16/2018)  . Chronic low back pain 08/24/2016  . Chronic lower back pain   . Claustrophobia   . Common migraine 05/14/2014  . Coronary artery disease   . Depression   . Dysrhythmia   . GERD (gastroesophageal reflux disease)   . H/O hiatal hernia   . Headache    "at least 2/month" (01/16/2018)  . Heart murmur   . Hyperlipidemia   . Hypertension   . Irregular heart beat    . Leg cramps    both legs at times  . Melanoma ([place]) 05/2014   "burned off BLE" (01/16/2018)  . Memory change 05/14/2014  . Migraine    "5-6/year"  (01/16/2018)  . Myocardial infarction ([place]) 04/1999, 02/2000, 01/2005; 2011; 2014  . Neuromuscular disorder ([place])    ?  . Obesity 01-2010  . Obstructive sleep apnea    "can't wear machine; I have claustrophobia" (01/16/2018)  . On home oxygen therapy    "2L at night and prn during daytime" (01/16/2018)  . Osteoarthritis    "knees and hands" (01/16/2018)  . Other and unspecified angina pectoris   . Peripheral vascular disease (H[facility])    ? numbness, tingling arms and legs  . Pneumonia 2000's   "once"  . PONV (postoperative nausea and vomiting)   . Shortness of breath    with exertion  . Stroke ([place]) 03/22/12   right side brain; denies residual on 07/18/2013)  . Stroke [facility]) Oct. 2015   Affected pt.s balance  . Stroke [facility]) 10/2014   "affected my legs; fully recovered now"; still have sporatic memory issues from this one" (01/16/2018)  . Type II diabetes mellitus ([place])   . Ventral hernia    hx of  .  Vomiting    persistent  . Vomiting blood     Patient Active Problem List   Diagnosis Date Noted  . Respiratory failure, acute-on-chronic ([place]) 03/25/2018  . Acute on chronic combined systolic and diastolic CHF (congestive heart failure) ([place]) 01/16/2018  . Acute on chronic diastolic CHF (congestive heart failure) ([place]) 01/16/2018  . Acute on chronic diastolic (congestive) heart failure ([place]) 01/16/2018  . Atrial fibrillation ([place]) 10/21/2017  . CAD (coronary artery disease) of bypass graft 10/20/2017  . Coronary artery disease of bypass graft of native heart with stable angina pectoris ([place])   . Chronic low back pain 08/24/2016  . Pain of left thumb 08/05/2016  . Nocturnal hypoxia 06/02/2016  . Hoarseness 04/05/2016  . Laryngopharyngeal reflux (LPR) 04/05/2016  . Pharyngoesophageal dysphagia 04/05/2016  . Angina decubitus ([place])  05/22/2015  . Chronic insomnia 05/06/2015  . Common migraine 05/14/2014  . Abnormality of gait 05/14/2014  . Memory change 05/14/2014  . Aneurysm, cerebral, nonruptured 05/14/2014  . Cramp of limb-Left neck 05/10/2014  . Hematemesis with nausea   . Vomiting blood   . Dizziness and giddiness 09/21/2013  . Atypical chest pain 07/18/2013  . Unstable angina ([place]) 04/09/2013  . Carotid artery stenosis, symptomatic 03/20/2013  . Chronic diastolic CHF (congestive heart failure) ([place]) 09/23/2012  . Cerebrovascular disease 07/10/2012  . Cerebral artery occlusion with cerebral infarction ([place]) 07/10/2012  . Mitral regurgitation 04/15/2012  . TIA (transient ischemic attack) 04/15/2012  . Occlusion and stenosis of carotid artery without mention of cerebral infarction 08/18/2011  . Bariatric surgery status 06/17/2011  . Speech abnormality 03/22/2011  . Dyspnea 02/24/2011  . PAPILLARY MUSCLE DYSFUNCTION, NON-RHEUMATIC 10/09/2008  . UNSPECIFIED VITAMIN D DEFICIENCY 10/24/2007  . MYOCARDIAL INFARCTION, HX OF 10/24/2007  . PERSISTENT VOMITING 10/24/2007  . OSTEOARTHRITIS 10/24/2007  . MIGRAINES, HX OF 10/24/2007  . DM 01/16/2007  . Hyperlipemia 01/16/2007  . Obesity-post failed open gastroplasty 1984  01/16/2007  . OBSTRUCTIVE SLEEP APNEA 01/16/2007  . Essential hypertension 01/16/2007  . Coronary atherosclerosis 01/16/2007  . Asthma 01/16/2007  . GERD 01/16/2007  . VENTRAL HERNIA 01/16/2007    Past Surgical History:  Procedure Laterality Date  . ANKLE FRACTURE SURGERY Left 1970's  . APPENDECTOMY  1970's   w/hysterectomy  . BASAL CELL CARCINOMA EXCISION Left 05/2014   "shoulder" (01/16/2018)  . CARDIAC CATHETERIZATION  10/10/2012   Dr [name].  Marland Kitchen CARDIAC CATHETERIZATION N/A 05/29/2015   Procedure: Right/Left Heart Cath and Coronary/Graft Angiography;  Surgeon: Larey Dresser, MD;  Location: [place] CV LAB;  Service: Cardiovascular;  Laterality: N/A;  . CAROTID ENDARTERECTOMY Left  03/2013  . CAROTID STENT INSERTION Left 03/20/2013   Procedure: CAROTID STENT INSERTION;  Surgeon: [name], MD;  Location: [facility] CATH LAB;  Service: Cardiovascular;  Laterality: Left;  internal carotid  . CEREBRAL ANGIOGRAM N/A 04/05/2011   Procedure: CEREBRAL ANGIOGRAM;  Surgeon: Angelia Mould, MD;  Location: [facility] CATH LAB;  Service: Cardiovascular;  Laterality: N/A;  . CHOLECYSTECTOMY OPEN  2004  . CORONARY ANGIOPLASTY WITH STENT PLACEMENT  01,02,05,06,07,08,11; 04/24/2013   "I've probably got ~ 10 stents by now" (04/24/2013)  . CORONARY ANGIOPLASTY WITH STENT PLACEMENT  06/13/2013   "got 4 stents today" (06/13/2013)  . CORONARY ARTERY BYPASS GRAFT  1220/11   "CABG X5"  . CORONARY STENT INTERVENTION N/A 10/20/2017   Procedure: CORONARY STENT INTERVENTION;  Surgeon: Troy Sine, MD;  Location: [facility] CV LAB;  Service: Cardiovascular;  Laterality: N/A;  . ESOPHAGOGASTRODUODENOSCOPY  08/03/2011  Procedure: ESOPHAGOGASTRODUODENOSCOPY (EGD);  Surgeon: Shann Medal, MD;  Location: [facility];  Service: General;  Laterality: N/A;  . ESOPHAGOGASTRODUODENOSCOPY (EGD) WITH PROPOFOL N/A 03/11/2014   Procedure: ESOPHAGOGASTRODUODENOSCOPY (EGD) WITH PROPOFOL;  Surgeon: Lafayette Dragon, MD;  Location: [facility] ENDOSCOPY;  Service: Endoscopy;  Laterality: N/A;  . FRACTURE SURGERY    . gall stone removal  05/2003  . GASTRIC RESTRICTION SURGERY  1984   "stapeling"  . HERNIA REPAIR  2004   "in my stomach; had OR on it twice", wire mesh on 1 hernia  . LEFT HEART CATH AND CORS/GRAFTS ANGIOGRAPHY N/A 07/09/2016   Procedure: LEFT HEART CATH AND CORS/GRAFTS ANGIOGRAPHY;  Surgeon: Larey Dresser, MD;  Location: [facility] CV LAB;  Service: Cardiovascular;  Laterality: N/A;  . OVARY SURGERY  1970's   "tumor removed"  . PERCUTANEOUS CORONARY STENT INTERVENTION (PCI-S) N/A 06/13/2013   Procedure: PERCUTANEOUS CORONARY STENT INTERVENTION (PCI-S);  Surgeon: [name], MD;  Location: [facility] CATH LAB;   Service: Cardiovascular;  Laterality: N/A;  . PERCUTANEOUS STENT INTERVENTION N/A 04/24/2013   Procedure: PERCUTANEOUS STENT INTERVENTION;  Surgeon: [name], MD;  Location: [facility] CATH LAB;  Service: Cardiovascular;  Laterality: N/A;  . RIGHT/LEFT HEART CATH AND CORONARY/GRAFT ANGIOGRAPHY N/A 10/20/2017   Procedure: RIGHT/LEFT HEART CATH AND CORONARY/GRAFT ANGIOGRAPHY;  Surgeon: Larey Dresser, MD;  Location: [place] CV LAB;  Service: Cardiovascular;  Laterality: N/A;  . ROOT CANAL  10/2000  . TIBIA FRACTURE SURGERY Right 1970's   rods and pins  . TOOTH EXTRACTION     "1 on the upper; wisdom tooth on the lower" (01/16/2018)  . TOTAL ABDOMINAL HYSTERECTOMY  1970's   w/ appendectomy     OB History   No obstetric history on file.      Home Medications    Prior to Admission medications   Medication Sig Start Date End Date Taking? Authorizing Provider  acetaminophen (TYLENOL) 500 MG tablet Take 1,000 mg by mouth every 6 (six) hours as needed for mild pain or headache.    Yes [provider]  albuterol (PROVENTIL) (2.5 MG/3ML) 0.083% nebulizer solution Take 2.5 mg by nebulization every 4 (four) hours as needed for wheezing or shortness of breath.    Yes [provider]  albuterol (PROVENTIL,VENTOLIN) 90 MCG/ACT inhaler Inhale 2 puffs into the lungs every 4 (four) hours as needed for wheezing or shortness of breath.    Yes [provider]  Alirocumab (PRALUENT) 150 MG/ML SOPN Inject 1 pen into the skin every 14 (fourteen) days. 04/07/16  Yes Larey Dresser, MD  ALPRAZolam Duanne Moron) 0.5 MG tablet Take 0.5-1 mg by mouth See admin instructions. Take 0.5 mg by mouth in the morning. Take 0.5 mg by mouth at noon if needed for anxiety. Take 1 mg by mouth at bedtime.   Yes [provider]  amiodarone (PACERONE) 100 MG tablet Take 1 tablet (100 mg total) by mouth daily. 02/08/18  Yes Georgiana Shore, NP  amLODipine (NORVASC) 10 MG tablet Take 10 mg by mouth at  bedtime.  01/20/15  Yes [provider]  apixaban (ELIQUIS) 5 MG TABS tablet Take 1 tablet (5 mg total) by mouth 2 (two) times daily. 02/08/18  Yes Georgiana Shore, NP  b complex vitamins capsule Take 1 capsule by mouth daily.   Yes [provider]  buPROPion (WELLBUTRIN XL) 150 MG 24 hr tablet Take 150 mg by mouth daily.   Yes [provider]  Calcium Carbonate 1500 (  600 CA) MG TABS Take 1,500 mg by mouth daily with breakfast.   Yes [provider]  Cholecalciferol (VITAMIN D) 2000 UNITS tablet Take 2,000 Units by mouth daily with breakfast.    Yes [provider]  cloNIDine (CATAPRES) 0.2 MG tablet Take 1 tablet (0.2 mg total) by mouth 2 (two) times daily. 11/03/17 11/03/18 Yes Clegg, Amy D, NP  clopidogrel (PLAVIX) 75 MG tablet TAKE ONE (1) TABLET BY MOUTH EVERY DAY 02/09/18  Yes Bensimhon, Shaune Pascal, MD  denosumab (PROLIA) 60 MG/ML SOLN injection Inject 60 mg into the skin every 6 (six) months. Administer in upper arm, thigh, or abdomen   Yes [provider]  DULoxetine (CYMBALTA) 30 MG capsule Take 1 capsule (30 mg total) by mouth 2 (two) times daily. Patient taking differently: Take 30 mg by mouth daily.  07/15/17  Yes Kathrynn Ducking, MD  ezetimibe (ZETIA) 10 MG tablet TAKE ONE (1) TABLET EACH DAY Patient taking differently: Take 10 mg by mouth at bedtime.  07/16/16  Yes Bensimhon, Shaune Pascal, MD  Fluticasone-Salmeterol (ADVAIR DISKUS) 250-50 MCG/DOSE AEPB Inhale 1 puff into the lungs 2 (two) times daily.    Yes [provider]  gabapentin (NEURONTIN) 600 MG tablet Take 600-1,200 mg by mouth See admin instructions. Take 600 mg by mouth in the morning and take 600 mg by mouth in the afternoon. Take 1200 mg by mouth at bedtime.   Yes [provider]  HUMALOG KWIKPEN 100 UNIT/ML SOPN Inject 18-26 Units into the skin 3 (three) times daily before meals. Per sliding scale 09/19/12  Yes [provider]  HYDROcodone-acetaminophen  (NORCO/VICODIN) 5-325 MG per tablet Take 1 tablet by mouth at bedtime.    Yes [provider]  ipratropium-albuterol (DUONEB) 0.5-2.5 (3) MG/3ML SOLN Take 3 mLs by nebulization every 4 (four) hours as needed (for severe shortness of breath or wheezing).    Yes [provider]  loperamide (IMODIUM) 2 MG capsule Take 2 mg by mouth 2 (two) times daily as needed for diarrhea or loose stools.    Yes [provider]  losartan (COZAAR) 100 MG tablet Take 100 mg by mouth daily.   Yes [provider]  magnesium oxide (MAG-OX) 400 MG tablet Take 400 mg by mouth 2 (two) times daily.   Yes [provider]  methocarbamol (ROBAXIN) 500 MG tablet TAKE ONE (1) TABLET THREE (3) TIMES EACH DAY 08/24/16  Yes Kathrynn Ducking, MD  metolazone (ZAROXOLYN) 2.5 MG tablet Take 1 tablet (2.5 mg total) by mouth once a week. Take 1 Tablet once weekly on Mondays. Patient taking differently: Take 2.5 mg by mouth every Monday.  11/22/17  Yes Larey Dresser, MD  metoprolol succinate (TOPROL-XL) 25 MG 24 hr tablet Take 1 tablet (25 mg total) by mouth daily. 01/20/18  Yes Clegg, Amy D, NP  Multiple Vitamin (MULTIVITAMIN) capsule Take 1 capsule by mouth daily.   Yes [provider]  multivitamin-lutein (OCUVITE-LUTEIN) CAPS capsule Take 1 capsule by mouth daily.   Yes [provider]  nitroGLYCERIN (NITROSTAT) 0.3 MG SL tablet Place 1 tablet (0.3 mg total) under the tongue every 5 (five) minutes x 3 doses as needed for chest pain. 09/15/17  Yes Georgiana Shore, NP  ondansetron (ZOFRAN) 4 MG tablet Take 1 tablet (4 mg total) by mouth 2 (two) times daily as needed. For nausea Patient taking differently: Take 4 mg by mouth 2 (two) times daily.  02/12/14  Yes Lafayette Dragon, MD  OXYGEN Inhale 2 L into the lungs at bedtime.    Yes [provider]  pantoprazole (PROTONIX) 40 MG tablet Take 1 tablet (40 mg total) by mouth 2 (two) times daily. 04/26/16  Yes Irene Shipper,  MD  Polyvinyl Alcohol-Povidone PF (REFRESH) 1.4-0.6 % SOLN Place 2 drops into both eyes 2 (two) times daily as needed (for dry eyes).   Yes [provider]  potassium chloride SA (K-DUR,KLOR-CON) 20 MEQ tablet Take 1 tablet (20 mEq total) by mouth 2 (two) times daily. 12/14/17  Yes Larey Dresser, MD  ranolazine (RANEXA) 1000 MG SR tablet Take 1 tablet (1,000 mg total) by mouth 2 (two) times daily. 02/08/18  Yes Georgiana Shore, NP  rosuvastatin (CRESTOR) 40 MG tablet TAKE ONE (1) TABLET EACH DAY Patient taking differently: Take 40 mg by mouth at bedtime.  12/10/16  Yes Bensimhon, Shaune Pascal, MD  torsemide (DEMADEX) 20 MG tablet Take 5 tablets (100 mg total) by mouth 2 (two) times daily. 01/19/18 03/25/18 Yes Clegg, Amy D, NP  triamcinolone cream (KENALOG) 0.1 % Apply 1 application topically 2 (two) times daily.    Yes [provider]  vitamin B-12 (CYANOCOBALAMIN) 1000 MCG tablet Take 1,000 mcg by mouth daily.   Yes [provider]  zinc gluconate 50 MG tablet Take 50 mg by mouth daily.    Yes [provider]  zolpidem (AMBIEN) 10 MG tablet Take 10 mg by mouth at bedtime.    Yes [provider]  Insulin Glargine (LANTUS SOLOSTAR) 100 UNIT/ML Solostar Pen Inject 50 Units into the skin at bedtime.     [provider]    Family History Family History  Problem Relation Age of Onset  . Heart disease Father        Heart Disease before age 85  . Diabetes Father   . Hyperlipidemia Father   . Hypertension Father   . Heart attack Father   . Deep vein thrombosis Father   . AAA (abdominal aortic aneurysm) Father   . Hypertension Mother   . Deep vein thrombosis Mother   . AAA (abdominal aortic aneurysm) Mother   . Cancer Sister        Ovarian  . Hypertension Sister   . Diabetes Paternal Grandmother   . Heart disease Paternal Uncle   . Diabetes Paternal Uncle   . Heart attack Paternal Grandfather 28       died of MI at 52  . Diabetes Paternal  Aunt   . Diabetes Paternal Uncle   . Colon cancer Neg Hx     Social History Social History   Tobacco Use  . Smoking status: Never Smoker  . Smokeless tobacco: Never Used  Substance Use Topics  . Alcohol use: Never    Frequency: Never  . Drug use: Never     Allergies   Amoxicillin; Brilinta [ticagrelor]; Erythromycin; Flagyl [metronidazole]; Penicillins; Isosorbide mononitrate [isosorbide nitrate]; Jardiance [empagliflozin]; Metformin and related; Tape; and Erythromycin base   Review of Systems Review of Systems 10 Systems reviewed and are negative for acute change except as noted in the HPI.   Physical Exam Updated Vital Signs BP (!) 102/51   Pulse 61   Temp 98.8 F (37.1 C) (Oral)   Resp 14   Ht 5' 2.5" (1.588 m)   Wt (!) 139.7 kg   SpO2 97%   BMI 55.44 kg/m   Physical Exam Constitutional:      Comments: Alert with clear mental status.  Intermittent  harsh dry heaves.  At times this is actually subsequent to cough paroxysmal.  Mild increased work of breathing.  Morbid obesity.  HENT:     Head: Normocephalic and atraumatic.     Nose:     Comments: Mucous membranes pink and moist.  Posterior oropharynx is widely patent. Eyes:     Extraocular Movements: Extraocular movements intact.     Pupils: Pupils are equal, round, and reactive to light.  Neck:     Musculoskeletal: Neck supple.  Cardiovascular:     Rate and Rhythm: Normal rate and regular rhythm.  Pulmonary:     Comments: Decreased breath sounds at bases.  Coarse expiratory wheeze.  Occasional rhonchi. Abdominal:     Comments: Morbid obesity.  Soft and nontender.  No apparent candidal rash within skin folds.  Musculoskeletal:     Comments: 1+ to 2+ pitting edema bilateral lower extremities.  No wounds or sores or cellulitis to the feet or legs. Patient has a 10 cm oval ecchymotic bruise on the left scapula.  He has a diffuse mottled brown hyperpigmentation over the lower thoracic back.  This is chronic in  appearance.  Skin:    General: Skin is warm and dry.  Neurological:     Comments: Patient is alert with clear mental status.  Speech is clear and intact.  Patient has significant limitations due to body habitus.  She can assist in trying to grip with her hands and pull herself forward.  She can independently move each leg.      ED Treatments / Results  Labs (all labs ordered are listed, but only abnormal results are displayed) Labs Reviewed  COMPREHENSIVE METABOLIC PANEL - Abnormal; Notable for the following components:      Result Value   Glucose, Bld 166 (*)    Creatinine, Ser 1.34 (*)    Total Bilirubin 1.5 (*)    GFR calc non Af Amer 40 (*)    GFR calc Af Amer 46 (*)    All other components within normal limits  BRAIN NATRIURETIC PEPTIDE - Abnormal; Notable for the following components:   B Natriuretic Peptide 1,480.1 (*)    All other components within normal limits  TROPONIN I - Abnormal; Notable for the following components:   Troponin I 0.05 (*)    All other components within normal limits  LACTIC ACID, PLASMA - Abnormal; Notable for the following components:   Lactic Acid, Venous 2.2 (*)    All other components within normal limits  CBC WITH DIFFERENTIAL/PLATELET - Abnormal; Notable for the following components:   MCV 101.4 (*)    Platelets 104 (*)    Neutro Abs 8.1 (*)    Lymphs Abs 0.6 (*)    All other components within normal limits  PROTIME-INR - Abnormal; Notable for the following components:   Prothrombin Time 18.8 (*)    All other components within normal limits  URINALYSIS, ROUTINE W REFLEX MICROSCOPIC - Abnormal; Notable for the following components:   Glucose, UA 50 (*)    Hgb urine dipstick MODERATE (*)    Ketones, ur 20 (*)    Protein, ur >=300 (*)    Bacteria, UA RARE (*)    All other components within normal limits  TROPONIN I - Abnormal; Notable for the following components:   Troponin I 0.40 (*)    All other components within normal limits    GLUCOSE, CAPILLARY - Abnormal; Notable for the following components:   Glucose-Capillary 170 (*)    All  other components within normal limits  POCT I-STAT EG7 - Abnormal; Notable for the following components:   Bicarbonate 30.0 (*)    Acid-Base Excess 4.0 (*)    Calcium, Ion 1.09 (*)    All other components within normal limits  POCT I-STAT 7, (LYTES, BLD GAS, ICA,H+H) - Abnormal; Notable for the following components:   pH, Arterial 7.335 (*)    pCO2 arterial 53.6 (*)    Bicarbonate 28.6 (*)    Calcium, Ion 1.13 (*)    HCT 35.0 (*)    Hemoglobin 11.9 (*)    All other components within normal limits  URINE CULTURE  CULTURE, BLOOD (ROUTINE X 2)  CULTURE, BLOOD (ROUTINE X 2)  CULTURE, RESPIRATORY  MRSA PCR SCREENING  LIPASE, BLOOD  LACTIC ACID, PLASMA  MAGNESIUM  PHOSPHORUS  INFLUENZA PANEL BY PCR (TYPE A & B)  HEPARIN LEVEL (UNFRACTIONATED)  CBC  BASIC METABOLIC PANEL  MAGNESIUM  PHOSPHORUS  TROPONIN I  LACTIC ACID, PLASMA  I-STAT VENOUS BLOOD GAS, ED    EKG EKG Interpretation  Date/Time:  Saturday March 25 2018 15:40:23 EST Ventricular Rate:  77 PR Interval:    QRS Duration: 175 QT Interval:  429 QTC Calculation: 486 R Axis:   171 Text Interpretation:  Sinus rhythm Nonspecific intraventricular conduction delay Borderline ST depression, lateral leads old RBBB no sig change from previous Confirmed by Charlesetta Shanks [phone]) on 03/26/2018 12:40:06 AM   Radiology Dg Chest Port 1 View  Result Date: 03/25/2018 CLINICAL DATA:  Post intubation EXAM: PORTABLE CHEST 1 VIEW COMPARISON:  03/25/2018 FINDINGS: Changes of CABG. Endotracheal tube is in place with the tip approximately 2.5 cm above the carina. NG tube enters the stomach. Cardiomegaly. Worsening bilateral airspace opacities, likely edema. No visible effusions or acute bony abnormality. IMPRESSION: Cardiomegaly. Worsening bilateral airspace disease, likely edema/CHF. Electronically Signed   By: Rolm Baptise M.D.    On: 03/25/2018 20:59   Dg Chest Port 1 View  Result Date: 03/25/2018 CLINICAL DATA:  Cough, vomiting, wheezing EXAM: PORTABLE CHEST 1 VIEW COMPARISON:  07/07/2017 FINDINGS: Prior CABG. Cardiomegaly with vascular congestion and interstitial opacities, likely edema. No effusions or acute bony abnormality. IMPRESSION: Cardiomegaly, mild CHF. Electronically Signed   By: Rolm Baptise M.D.   On: 03/25/2018 17:33    Procedures Procedures (including critical care time) CRITICAL CARE Performed by: Charlesetta Shanks   Total critical care time: 60 minutes  Critical care time was exclusive of separately billable procedures and treating other patients.  Critical care was necessary to treat or prevent imminent or life-threatening deterioration.  Critical care was time spent personally by me on the following activities: development of treatment plan with patient and/or surrogate as well as nursing, discussions with consultants, evaluation of patient's response to treatment, examination of patient, obtaining history from patient or surrogate, ordering and performing treatments and interventions, ordering and review of laboratory studies, ordering and review of radiographic studies, pulse oximetry and re-evaluation of patient's condition.  Medications Ordered in ED Medications  aztreonam (AZACTAM) 2 g in sodium chloride 0.9 % 100 mL IVPB (has no administration in time range)  nitroGLYCERIN (NITROSTAT) SL tablet 0.4 mg ( Sublingual Not Given 03/25/18 2336)  propofol (DIPRIVAN) 1000 MG/100ML infusion (  Canceled Entry 03/25/18 2337)  fentaNYL (SUBLIMAZE) injection 50 mcg (has no administration in time range)  furosemide (LASIX) 160 mg in dextrose 5 % 50 mL IVPB (has no administration in time range)  clopidogrel (PLAVIX) tablet 75 mg (75 mg Per NG tube Not  Given 03/25/18 2333)  amiodarone (PACERONE) tablet 100 mg (100 mg Per NG tube Given 03/25/18 2353)  ezetimibe (ZETIA) tablet 10 mg (10 mg Per Tube Given  03/25/18 2346)  rosuvastatin (CRESTOR) tablet 40 mg (40 mg Per NG tube Given 03/25/18 2347)  fentaNYL 2537mg in NS 2555m(1010mml) infusion-PREMIX (75 mcg/hr Intravenous Rate/Dose Change 03/25/18 2203)  fentaNYL (SUBLIMAZE) bolus via infusion 25 mcg (has no administration in time range)  docusate (COLACE) 50 MG/5ML liquid 100 mg (has no administration in time range)  bisacodyl (DULCOLAX) suppository 10 mg (has no administration in time range)  heparin ADULT infusion 100 units/mL (25000 units/250m54mdium chloride 0.45%) (1,300 Units/hr Intravenous New Bag/Given 03/25/18 2143)  ranolazine (RANEXA) 12 hr tablet 1,000 mg (1,000 mg Oral Given 03/25/18 2346)  ALPRAZolam (XANAX) tablet 0.5 mg (has no administration in time range)  buPROPion (WELLBUTRIN XL) 24 hr tablet 150 mg (has no administration in time range)  DULoxetine (CYMBALTA) DR capsule 30 mg (has no administration in time range)  insulin glargine (LANTUS) injection 25 Units (has no administration in time range)  pantoprazole (PROTONIX) EC tablet 40 mg (40 mg Oral Given 03/25/18 2346)  vitamin B-12 (CYANOCOBALAMIN) tablet 1,000 mcg (has no administration in time range)  gabapentin (NEURONTIN) tablet 600 mg (has no administration in time range)  cholecalciferol (VITAMIN D3) tablet 2,000 Units (has no administration in time range)  multivitamin (PROSIGHT) tablet 1 tablet (has no administration in time range)  ipratropium-albuterol (DUONEB) 0.5-2.5 (3) MG/3ML nebulizer solution 3 mL (has no administration in time range)  budesonide (PULMICORT) nebulizer solution 0.25 mg (0.25 mg Nebulization Given 03/25/18 2322)  insulin aspart (novoLOG) injection 2-6 Units (4 Units Subcutaneous Given 03/25/18 2351)  arformoterol (BROVANA) nebulizer solution 15 mcg (15 mcg Nebulization Given 03/25/18 2322)  levofloxacin (LEVAQUIN) IVPB 750 mg (has no administration in time range)  cloNIDine (CATAPRES) tablet 0.1 mg (has no administration in time range)  gabapentin  (NEURONTIN) tablet 1,200 mg (1,200 mg Oral Given 03/25/18 2347)  chlorhexidine gluconate (MEDLINE KIT) (PERIDEX) 0.12 % solution 15 mL (has no administration in time range)  MEDLINE mouth rinse (has no administration in time range)  acetaminophen (TYLENOL) tablet 1,000 mg (1,000 mg Oral Given 03/25/18 1944)  furosemide (LASIX) injection 40 mg (40 mg Intravenous Given 03/25/18 1943)  LORazepam (ATIVAN) injection 0.5 mg (0.5 mg Intravenous Given 03/25/18 1958)  etomidate (AMIDATE) injection (20 mg Intravenous Given 03/25/18 2028)  succinylcholine (ANECTINE) injection (150 mg Intravenous Given 03/25/18 2028)  propofol (DIPRIVAN) 1000 MG/100ML infusion ( Intravenous Stopped 03/25/18 2034)  fentaNYL (SUBLIMAZE) injection 50 mcg (50 mcg Intravenous Given 03/25/18 2144)  heparin bolus via infusion 4,000 Units (4,000 Units Intravenous Bolus from Bag 03/25/18 2144)     Initial Impression / Assessment and Plan / ED Course  I have reviewed the triage vital signs and the nursing notes.  Pertinent labs & imaging results that were available during my care of the patient were reviewed by me and considered in my medical decision making (see chart for details).       Consult: Reviewed with cardiology who is consult in the emergency department. Consult: Intensivist for admission.  Patient presents with worsening symptoms at home of cough, shortness of breath, nausea vomiting.  Patient's presentation suggestive of infectious etiology.  She does have fever.  Work-up however did not show lactic acidosis nor focus of infection.  Clinically focused with suggest pneumonia.  Patient however identified as significant congestive heart failure.  BNP significantly elevated.  Patient had increasing respiratory  distress and significant peripheral edema.  Treatment initiated for pneumonia with antibiotics.  Patient was placed on BiPAP and given Lasix however she continued to have fairly rapidly increasing work of breathing and  fatigue with hypoxia.  At that time determined the patient needed intubation for support.  Patient intubated and admitted to ICU.  Final Clinical Impressions(s) / ED Diagnoses   Final diagnoses:  Community acquired pneumonia, unspecified laterality  Acute respiratory failure with hypoxia ([place])  Acute on chronic congestive heart failure, unspecified heart failure type [facility])    ED Discharge Orders    None       Charlesetta Shanks, MD 03/26/18 [phone]

## 2018-03-25 NOTE — Consult Note (Signed)
Cardiology Consultation:   Patient ID: [name] [name] [name]: [id number]; DOB: 19-Jul-1945  Admit date: 03/25/2018 Date of Consult: 03/25/2018  Primary Care Provider: Derinda Late, MD Primary Cardiologist: [name]  Patient Profile:   [name] [name] is a 73 y.o. female with a hx of CAD s/p CABG and multiple PCI, HFpEF s/p CardioMEMS, AF, cerebrovascular disease, CKD, DM2, PAD, who presents with fever and cough who is being seen today for HF management at the request of Dr. Johnney Killian.  History of Present Illness:   [name] [name] is a 73 y.o. female with a hx of CAD s/p CABG and multiple PCI, HFpEF s/p CardioMEMS, AF, cerebrovascular disease, CKD, DM2, PAD, who presents with fever and cough who is being seen today for HF management.  The patient has multiple cardiac comorbidities and follows in clinic with Dr. [name]. She was last seen in clinic in January 2020 for follow up after hospitalization for in December for CardioMEMS implantation and IV diuresis. At that visit, she reported stable weight with some intermittent LE edema.   She presented to the ED today with one week of cough and vomiting. In the ED, pt febrile to 102.6 with SBP 170-209, HR 70s, O2 in the mid 80s. ECG showed sinus rhythm with LBBB-like IVCD. VBG 7.38/50. Labs included Cr 1.34 (at/improved from baseline), BNP 1480 (last 701 in 01/2018), troponin 0.05. Lactate 1.8. WBC 9.4. Flu panel negative. CXR showed cardiomegaly and bilateral infiltrates. She was intubated in the ED and started on IV aztreonam. IV furosemide was given, and NTG infusion was started for BP control.   On my evaluation, the patient is intubated and sedated. History is gathered from family at bedside. They report that she has had coughing and vomiting (generally after coughing) for about a week. They state that she has been too sick to take her mediations regularly, and as a result, she has accumulated fluid over this time period. Her daughter checked her  oxygen saturation this afternoon and found it to be in the 70s.    Past Medical History:  Diagnosis Date  . Abnormality of gait 05/14/2014  . Anemia    hx  . Anginal pain ([place])   . Anxiety   . Asthma   . Basal cell carcinoma 05/2014   "left shoulder"  . Bundle branch block, left    chronic/notes 07/18/2013  . CHF (congestive heart failure) ([place])   . Chronic bronchitis ([place])    "off and on all the time" (07/18/2013)  . Chronic insomnia 05/06/2015  . Chronic kidney disease    frequency, sees dr Jamal Maes every 4 to 6 months (01/16/2018)  . Chronic low back pain 08/24/2016  . Chronic lower back pain   . Claustrophobia   . Common migraine 05/14/2014  . Coronary artery disease   . Depression   . Dysrhythmia   . GERD (gastroesophageal reflux disease)   . H/O hiatal hernia   . Headache    "at least 2/month" (01/16/2018)  . Heart murmur   . Hyperlipidemia   . Hypertension   . Irregular heart beat   . Leg cramps    both legs at times  . Melanoma ([place]) 05/2014   "burned off BLE" (01/16/2018)  . Memory change 05/14/2014  . Migraine    "5-6/year"  (01/16/2018)  . Myocardial infarction ([place]) 04/1999, 02/2000, 01/2005; 2011; 2014  . Neuromuscular disorder ([place])    ?  . Obesity 01-2010  . Obstructive sleep apnea    "can't wear  machine; I have claustrophobia" (01/16/2018)  . On home oxygen therapy    "2L at night and prn during daytime" (01/16/2018)  . Osteoarthritis    "knees and hands" (01/16/2018)  . Other and unspecified angina pectoris   . Peripheral vascular disease ([facility])    ? numbness, tingling arms and legs  . Pneumonia 2000's   "once"  . PONV (postoperative nausea and vomiting)   . Shortness of breath    with exertion  . Stroke ([place]) 03/22/12   right side brain; denies residual on 07/18/2013)  . Stroke [facility]) Oct. 2015   Affected pt.s balance  . Stroke [facility]) 10/2014   "affected my legs; fully recovered now"; still have sporatic memory issues from this one"  (01/16/2018)  . Type II diabetes mellitus ([place])   . Ventral hernia    hx of  . Vomiting    persistent  . Vomiting blood     Past Surgical History:  Procedure Laterality Date  . ANKLE FRACTURE SURGERY Left 1970's  . APPENDECTOMY  1970's   w/hysterectomy  . BASAL CELL CARCINOMA EXCISION Left 05/2014   "shoulder" (01/16/2018)  . CARDIAC CATHETERIZATION  10/10/2012   Dr [name].  Marland Kitchen CARDIAC CATHETERIZATION N/A 05/29/2015   Procedure: Right/Left Heart Cath and Coronary/Graft Angiography;  Surgeon: Larey Dresser, MD;  Location: [place] CV LAB;  Service: Cardiovascular;  Laterality: N/A;  . CAROTID ENDARTERECTOMY Left 03/2013  . CAROTID STENT INSERTION Left 03/20/2013   Procedure: CAROTID STENT INSERTION;  Surgeon: [name], MD;  Location: [facility] CATH LAB;  Service: Cardiovascular;  Laterality: Left;  internal carotid  . CEREBRAL ANGIOGRAM N/A 04/05/2011   Procedure: CEREBRAL ANGIOGRAM;  Surgeon: Angelia Mould, MD;  Location: [facility] CATH LAB;  Service: Cardiovascular;  Laterality: N/A;  . CHOLECYSTECTOMY OPEN  2004  . CORONARY ANGIOPLASTY WITH STENT PLACEMENT  01,02,05,06,07,08,11; 04/24/2013   "I've probably got ~ 10 stents by now" (04/24/2013)  . CORONARY ANGIOPLASTY WITH STENT PLACEMENT  06/13/2013   "got 4 stents today" (06/13/2013)  . CORONARY ARTERY BYPASS GRAFT  1220/11   "CABG X5"  . CORONARY STENT INTERVENTION N/A 10/20/2017   Procedure: CORONARY STENT INTERVENTION;  Surgeon: Troy Sine, MD;  Location: [place] CV LAB;  Service: Cardiovascular;  Laterality: N/A;  . ESOPHAGOGASTRODUODENOSCOPY  08/03/2011   Procedure: ESOPHAGOGASTRODUODENOSCOPY (EGD);  Surgeon: Shann Medal, MD;  Location: [facility];  Service: General;  Laterality: N/A;  . ESOPHAGOGASTRODUODENOSCOPY (EGD) WITH PROPOFOL N/A 03/11/2014   Procedure: ESOPHAGOGASTRODUODENOSCOPY (EGD) WITH PROPOFOL;  Surgeon: Lafayette Dragon, MD;  Location: [facility] ENDOSCOPY;  Service: Endoscopy;  Laterality: N/A;  . FRACTURE SURGERY     . gall stone removal  05/2003  . GASTRIC RESTRICTION SURGERY  1984   "stapeling"  . HERNIA REPAIR  2004   "in my stomach; had OR on it twice", wire mesh on 1 hernia  . LEFT HEART CATH AND CORS/GRAFTS ANGIOGRAPHY N/A 07/09/2016   Procedure: LEFT HEART CATH AND CORS/GRAFTS ANGIOGRAPHY;  Surgeon: Larey Dresser, MD;  Location: [place] CV LAB;  Service: Cardiovascular;  Laterality: N/A;  . OVARY SURGERY  1970's   "tumor removed"  . PERCUTANEOUS CORONARY STENT INTERVENTION (PCI-S) N/A 06/13/2013   Procedure: PERCUTANEOUS CORONARY STENT INTERVENTION (PCI-S);  Surgeon: [name], MD;  Location: [facility] CATH LAB;  Service: Cardiovascular;  Laterality: N/A;  . PERCUTANEOUS STENT INTERVENTION N/A 04/24/2013   Procedure: PERCUTANEOUS STENT INTERVENTION;  Surgeon: [name], MD;  Location: [facility] CATH LAB;  Service: Cardiovascular;  Laterality: N/A;  . RIGHT/LEFT HEART CATH AND CORONARY/GRAFT ANGIOGRAPHY N/A 10/20/2017   Procedure: RIGHT/LEFT HEART CATH AND CORONARY/GRAFT ANGIOGRAPHY;  Surgeon: Larey Dresser, MD;  Location: [place] CV LAB;  Service: Cardiovascular;  Laterality: N/A;  . ROOT CANAL  10/2000  . TIBIA FRACTURE SURGERY Right 1970's   rods and pins  . TOOTH EXTRACTION     "1 on the upper; wisdom tooth on the lower" (01/16/2018)  . TOTAL ABDOMINAL HYSTERECTOMY  1970's   w/ appendectomy     Home Medications:  Prior to Admission medications   Medication Sig Start Date End Date Taking? Authorizing Provider  acetaminophen (TYLENOL) 500 MG tablet Take 1,000 mg by mouth every 6 (six) hours as needed for mild pain or headache.    Yes [provider]  albuterol (PROVENTIL) (2.5 MG/3ML) 0.083% nebulizer solution Take 2.5 mg by nebulization every 4 (four) hours as needed for wheezing or shortness of breath.    Yes [provider]  albuterol (PROVENTIL,VENTOLIN) 90 MCG/ACT inhaler Inhale 2 puffs into the lungs every 4 (four) hours as needed for wheezing or  shortness of breath.    Yes [provider]  Alirocumab (PRALUENT) 150 MG/ML SOPN Inject 1 pen into the skin every 14 (fourteen) days. 04/07/16  Yes Larey Dresser, MD  ALPRAZolam Duanne Moron) 0.5 MG tablet Take 0.5-1 mg by mouth See admin instructions. Take 0.5 mg by mouth in the morning. Take 0.5 mg by mouth at noon if needed for anxiety. Take 1 mg by mouth at bedtime.   Yes [provider]  amiodarone (PACERONE) 100 MG tablet Take 1 tablet (100 mg total) by mouth daily. 02/08/18  Yes Georgiana Shore, NP  amLODipine (NORVASC) 10 MG tablet Take 10 mg by mouth at bedtime.  01/20/15  Yes [provider]  apixaban (ELIQUIS) 5 MG TABS tablet Take 1 tablet (5 mg total) by mouth 2 (two) times daily. 02/08/18  Yes Georgiana Shore, NP  b complex vitamins capsule Take 1 capsule by mouth daily.   Yes [provider]  buPROPion (WELLBUTRIN XL) 150 MG 24 hr tablet Take 150 mg by mouth daily.   Yes [provider]  Calcium Carbonate 1500 (600 CA) MG TABS Take 1,500 mg by mouth daily with breakfast.   Yes [provider]  Cholecalciferol (VITAMIN D) 2000 UNITS tablet Take 2,000 Units by mouth daily with breakfast.    Yes [provider]  cloNIDine (CATAPRES) 0.2 MG tablet Take 1 tablet (0.2 mg total) by mouth 2 (two) times daily. 11/03/17 11/03/18 Yes Clegg, Amy D, NP  clopidogrel (PLAVIX) 75 MG tablet TAKE ONE (1) TABLET BY MOUTH EVERY DAY 02/09/18  Yes Bensimhon, Shaune Pascal, MD  denosumab (PROLIA) 60 MG/ML SOLN injection Inject 60 mg into the skin every 6 (six) months. Administer in upper arm, thigh, or abdomen   Yes [provider]  DULoxetine (CYMBALTA) 30 MG capsule Take 1 capsule (30 mg total) by mouth 2 (two) times daily. Patient taking differently: Take 30 mg by mouth daily.  07/15/17  Yes Kathrynn Ducking, MD  ezetimibe (ZETIA) 10 MG tablet TAKE ONE (1) TABLET EACH DAY Patient taking differently: Take 10 mg by mouth at bedtime.  07/16/16  Yes  Bensimhon, Shaune Pascal, MD  Fluticasone-Salmeterol (ADVAIR DISKUS) 250-50 MCG/DOSE AEPB Inhale 1 puff into the lungs 2 (two) times daily.    Yes [provider]  gabapentin (NEURONTIN) 600 MG tablet Take 600-1,200 mg by mouth See admin instructions.  Take 600 mg by mouth in the morning and take 600 mg by mouth in the afternoon. Take 1200 mg by mouth at bedtime.   Yes [provider]  HUMALOG KWIKPEN 100 UNIT/ML SOPN Inject 18-26 Units into the skin 3 (three) times daily before meals. Per sliding scale 09/19/12  Yes [provider]  HYDROcodone-acetaminophen (NORCO/VICODIN) 5-325 MG per tablet Take 1 tablet by mouth at bedtime.    Yes [provider]  ipratropium-albuterol (DUONEB) 0.5-2.5 (3) MG/3ML SOLN Take 3 mLs by nebulization every 4 (four) hours as needed (for severe shortness of breath or wheezing).    Yes [provider]  loperamide (IMODIUM) 2 MG capsule Take 2 mg by mouth 2 (two) times daily as needed for diarrhea or loose stools.    Yes [provider]  losartan (COZAAR) 100 MG tablet Take 100 mg by mouth daily.   Yes [provider]  magnesium oxide (MAG-OX) 400 MG tablet Take 400 mg by mouth 2 (two) times daily.   Yes [provider]  methocarbamol (ROBAXIN) 500 MG tablet TAKE ONE (1) TABLET THREE (3) TIMES EACH DAY 08/24/16  Yes Kathrynn Ducking, MD  metolazone (ZAROXOLYN) 2.5 MG tablet Take 1 tablet (2.5 mg total) by mouth once a week. Take 1 Tablet once weekly on Mondays. Patient taking differently: Take 2.5 mg by mouth every Monday.  11/22/17  Yes Larey Dresser, MD  metoprolol succinate (TOPROL-XL) 25 MG 24 hr tablet Take 1 tablet (25 mg total) by mouth daily. 01/20/18  Yes Clegg, Amy D, NP  Multiple Vitamin (MULTIVITAMIN) capsule Take 1 capsule by mouth daily.   Yes [provider]  multivitamin-lutein (OCUVITE-LUTEIN) CAPS capsule Take 1 capsule by mouth daily.   Yes [provider]    nitroGLYCERIN (NITROSTAT) 0.3 MG SL tablet Place 1 tablet (0.3 mg total) under the tongue every 5 (five) minutes x 3 doses as needed for chest pain. 09/15/17  Yes Georgiana Shore, NP  ondansetron (ZOFRAN) 4 MG tablet Take 1 tablet (4 mg total) by mouth 2 (two) times daily as needed. For nausea Patient taking differently: Take 4 mg by mouth 2 (two) times daily.  02/12/14  Yes Lafayette Dragon, MD  OXYGEN Inhale 2 L into the lungs at bedtime.    Yes [provider]  pantoprazole (PROTONIX) 40 MG tablet Take 1 tablet (40 mg total) by mouth 2 (two) times daily. 04/26/16  Yes Irene Shipper, MD  Polyvinyl Alcohol-Povidone PF (REFRESH) 1.4-0.6 % SOLN Place 2 drops into both eyes 2 (two) times daily as needed (for dry eyes).   Yes [provider]  potassium chloride SA (K-DUR,KLOR-CON) 20 MEQ tablet Take 1 tablet (20 mEq total) by mouth 2 (two) times daily. 12/14/17  Yes Larey Dresser, MD  ranolazine (RANEXA) 1000 MG SR tablet Take 1 tablet (1,000 mg total) by mouth 2 (two) times daily. 02/08/18  Yes Georgiana Shore, NP  rosuvastatin (CRESTOR) 40 MG tablet TAKE ONE (1) TABLET EACH DAY Patient taking differently: Take 40 mg by mouth at bedtime.  12/10/16  Yes Bensimhon, Shaune Pascal, MD  torsemide (DEMADEX) 20 MG tablet Take 5 tablets (100 mg total) by mouth 2 (two) times daily. 01/19/18 03/25/18 Yes Clegg, Amy D, NP  triamcinolone cream (KENALOG) 0.1 % Apply 1 application topically 2 (two) times daily.    Yes [provider]  vitamin B-12 (CYANOCOBALAMIN) 1000 MCG tablet Take 1,000 mcg by mouth daily.   Yes [provider]  zinc gluconate 50 MG tablet Take 50 mg by mouth daily.    Yes [provider]  zolpidem (AMBIEN) 10 MG tablet Take 10 mg by mouth at bedtime.    Yes [provider]  Insulin Glargine (LANTUS SOLOSTAR) 100 UNIT/ML Solostar Pen Inject 50 Units into the skin at bedtime.     [provider]    Inpatient Medications: Scheduled Meds: .  clopidogrel  75 mg Per NG tube Daily   Continuous Infusions: . aztreonam    . [START ON 03/26/2018] furosemide    . nitroGLYCERIN Stopped (03/25/18 2038)  . propofol (DIPRIVAN) infusion 35 mcg/kg/min (03/25/18 2109)  . propofol     PRN Meds: fentaNYL (SUBLIMAZE) injection, nitroGLYCERIN  Allergies:    Allergies  Allergen Reactions  . Amoxicillin Shortness Of Breath and Rash  . Brilinta [Ticagrelor] Shortness Of Breath  . Erythromycin Shortness Of Breath, Other (See Comments) and Hives    Trouble swallowing  . Flagyl [Metronidazole] Palpitations and Shortness Of Breath  . Penicillins Hives, Shortness Of Breath, Rash and Other (See Comments)    Has patient had a PCN reaction causing immediate rash, facial/tongue/throat swelling, SOB or lightheadedness with hypotension: Yes Has patient had a PCN reaction causing severe rash involving mucus membranes or skin necrosis: No Has patient had a PCN reaction that required hospitalization: Yes Has patient had a PCN reaction occurring within the last 10 years: No If all of the above answers are "NO", then may proceed with Cephalosporin use.   . Isosorbide Mononitrate [Isosorbide Nitrate] Other (See Comments)    Joint aches, muscles hurt, difficult to walk   . Jardiance [Empagliflozin] Other (See Comments)    Nausea, joint aches, muscles aches  . Metformin And Related Other (See Comments)    Stomach pain, cold sweats, joint pain, burred vision, dizziness  . Tape Other (See Comments)    Skin pulls off with certain types Plastic tape causes skin to rip if left on for long periods of time  . Erythromycin Base Rash    Social History:   Social History   Socioeconomic History  . Marital status: Married    Spouse name: [name]   . Number of children: 1  . Years of education: 12+  . Highest education level: Not on file  Occupational History  . Occupation: retired  Scientific laboratory technician  . Financial resource strain: Not on file  . Food insecurity:      Worry: Not on file    Inability: Not on file  . Transportation needs:    Medical: Not on file    Non-medical: Not on file  Tobacco Use  . Smoking status: Never Smoker  . Smokeless tobacco: Never Used  Substance and Sexual Activity  . Alcohol use: Never    Frequency: Never  . Drug use: Never  . Sexual activity: Not Currently    Birth control/protection: Surgical    Comment: hysterectomy  Lifestyle  . Physical activity:    Days per week: Not on file    Minutes per session: Not on file  . Stress: Not on file  Relationships  . Social connections:    Talks on phone: Not on file    Gets together: Not on file    Attends religious service: Not on file    Active member of club or organization: Not on file    Attends meetings of clubs or organizations: Not on file    Relationship status: Not on file  . Intimate partner violence:  Fear of current or ex partner: Not on file    Emotionally abused: Not on file    Physically abused: Not on file    Forced sexual activity: Not on file  Other Topics Concern  . Not on file  Social History Narrative   Patient lives at home with husband [name].    Patient has 1 child.    Patient is not currently working.    Patient has 2 years of college.    Patient is right handed.   Patient drinks caffeine occasionally.    Family History:     Family History  Problem Relation Age of Onset  . Heart disease Father        Heart Disease before age 79  . Diabetes Father   . Hyperlipidemia Father   . Hypertension Father   . Heart attack Father   . Deep vein thrombosis Father   . AAA (abdominal aortic aneurysm) Father   . Hypertension Mother   . Deep vein thrombosis Mother   . AAA (abdominal aortic aneurysm) Mother   . Cancer Sister        Ovarian  . Hypertension Sister   . Diabetes Paternal Grandmother   . Heart disease Paternal Uncle   . Diabetes Paternal Uncle   . Heart attack Paternal Grandfather 77       died of MI at 61  . Diabetes  Paternal Aunt   . Diabetes Paternal Uncle   . Colon cancer Neg Hx      ROS:  Unable to complete ROS as pt is intubated and sedated   Physical Exam/Data:   Vitals:   03/25/18 2015 03/25/18 2025 03/25/18 2035 03/25/18 2045  BP: (!) 200/108 (!) 179/105 (!) 170/88 (!) 119/59  Pulse: 100 93 84 68  Resp: (!) 36 (!) 26 14 14   Temp:      TempSrc:      SpO2: 94% 96% 94% 98%  Weight:      Height:       No intake or output data in the 24 hours ending 03/25/18 2114 Last 3 Weights 03/25/2018 03/20/2018 02/08/2018  Weight (lbs) 308 lb 315 lb 294 lb 9.6 oz  Weight (kg) 139.708 kg 142.883 kg 133.63 kg     Body mass index is 55.44 kg/m.  General:  Intubated and sedated HEENT: ETT in airway with tan sputum in ETT Neck: JVD difficult to assess given habitus Cardiac:  RRR no apparent murmur Lungs:  Mechanically ventilated with coarse breath sounds bilaterally  Abd: soft, nontender, morbidly obese Ext: diffusely large legs with mild pitting bilaterally Musculoskeletal:  No deformities, unable to test strength as intubated and sedated Skin: warm and dry  Neuro:  unable to test as intubated and sedated Psych:  intubated and sedated  EKG:  The EKG was personally reviewed and demonstrates:  sinus rhythm with LBBB-like IVCD Telemetry:  Telemetry was personally reviewed and demonstrates:  Sinus rhythm   Relevant CV Studies:  RHC/LHC 10/2017: RA mean 11 RV 36/10 PA 34/12, mean 23 PCWP mean 15 AO 116/57 Oxygen saturations: PA 74%, AO 97% Cardiac Output (Fick) 6.61, Cardiac Index (Fick) 2.85 Successful direct stenting of the proximal 70% smooth  eccentric saphenous vein graft stenosis sequentially supplying to distal circumflex marginal vessels with insertion of a 3.5 x 18 mm Xience Sierra DES stent postdilated to 3.8 mm with the stenosis being reduced to 0%.  There was brisk TIMI-3 flow and no evidence for dissection.   SPECT 10/2017:  Nuclear  stress EF: 48%.  Baseline EKG showed LBBB with  frequent PVCs  There is a large defect of moderate severity present in the basal inferior, mid anteroseptal, mid inferoseptal, mid inferior, apical septal, apical inferior and apex location. The defect is partially reversible and consistent with infarct with peri infarct ischemia in the mid anterseptal, apical septal and inferoseptal walls.  The left ventricular ejection fraction is mildly decreased (45-54%).  This is an intermediate risk study.  TTE 02/2017: - Left ventricle: The cavity size was normal. Wall thickness was   increased in a pattern of mild LVH. Systolic function was normal.   The estimated ejection fraction was in the range of 55% to 60%.   Wall motion was normal; there were no regional wall motion   abnormalities. Features are consistent with a pseudonormal left   ventricular filling pattern, with concomitant abnormal relaxation   and increased filling pressure (grade 2 diastolic dysfunction). - Aortic valve: Trileaflet; moderately calcified leaflets. There   was very mild stenosis. Mean gradient (S): 10 mm Hg. - Mitral valve: Moderately calcified annulus. Mildly calcified   leaflets . There was mild to moderate regurgitation. - Left atrium: The atrium was mildly to moderately dilated. - Right ventricle: The cavity size was mildly dilated. Systolic   function was normal. - Right atrium: The atrium was mildly dilated. - Tricuspid valve: Peak RV-RA gradient (S): 47 mm Hg. - Pulmonary arteries: PA peak pressure: 50 mm Hg (S). - Inferior vena cava: The vessel was normal in size. The   respirophasic diameter changes were in the normal range (= 50%),   consistent with normal central venous pressure.  Impressions: - Normal LV size with mild LV hypertrophy. EF 55-60%. Moderate   diastolic dysfunction. Mildly dilated RV with normal systolic   function. Moderate pulmonary hypertension. Mild to moderate MR.   Mild aortic stenosis.   Laboratory Data:  Chemistry Recent  Labs  Lab 03/20/18 1220 03/25/18 1630 03/25/18 1645  NA 142 138 139  K 4.3 4.1 4.1  CL 100 101  --   CO2 31 26  --   GLUCOSE 106* 166*  --   BUN 26* 15  --   CREATININE 1.73* 1.34*  --   CALCIUM 9.1 9.1  --   GFRNONAA  --  40*  --   GFRAA  --  46*  --   ANIONGAP  --  11  --     Recent Labs  Lab 03/25/18 1630  PROT 7.3  ALBUMIN 3.9  AST 38  ALT 22  ALKPHOS 72  BILITOT 1.5*   Hematology Recent Labs  Lab 03/25/18 1630 03/25/18 1645  WBC 9.4  --   RBC 4.33  --   HGB 13.4 14.3  HCT 43.9 42.0  MCV 101.4*  --   MCH 30.9  --   MCHC 30.5  --   RDW 14.1  --   PLT 104*  --    Cardiac Enzymes Recent Labs  Lab 03/25/18 1630  TROPONINI 0.05*   No results for input(s): TROPIPOC in the last 168 hours.  BNP Recent Labs  Lab 03/25/18 1630  BNP 1,480.1*    DDimer No results for input(s): DDIMER in the last 168 hours.  Radiology/Studies:  Dg Chest Port 1 View  Result Date: 03/25/2018 CLINICAL DATA:  Post intubation EXAM: PORTABLE CHEST 1 VIEW COMPARISON:  03/25/2018 FINDINGS: Changes of CABG. Endotracheal tube is in place with the tip approximately 2.5 cm above the carina. NG tube enters  the stomach. Cardiomegaly. Worsening bilateral airspace opacities, likely edema. No visible effusions or acute bony abnormality. IMPRESSION: Cardiomegaly. Worsening bilateral airspace disease, likely edema/CHF. Electronically Signed   By: Rolm Baptise M.D.   On: 03/25/2018 20:59   Dg Chest Port 1 View  Result Date: 03/25/2018 CLINICAL DATA:  Cough, vomiting, wheezing EXAM: PORTABLE CHEST 1 VIEW COMPARISON:  07/07/2017 FINDINGS: Prior CABG. Cardiomegaly with vascular congestion and interstitial opacities, likely edema. No effusions or acute bony abnormality. IMPRESSION: Cardiomegaly, mild CHF. Electronically Signed   By: Rolm Baptise M.D.   On: 03/25/2018 17:33    Assessment and Plan:   Acute hypoxic respiratory failure HFpEF with hypervolemia The patient presented to the ED with over  a week of nonspecific respiratory symptoms. She has been unable to take her home diuretic and has laboratory and clinical signs of hypervolemia. She was intubated in the ED for hypoxic respiratory failure. Given her high fever, there is concern for an infectious process contributing her presentation and clinical decline. Her high BP in the ED also suggest flash pulmonary edema as a further insult. In addition to workup and treatment of her cause of fever, she will need aggressive diuresis.  -Starting furosemide 160 mg BID with up-titration and addition of metolazone vs diuril as needed. Note: she is on yet larger doses of diuretic at home (torsemide 100 BID + intermittent metolazone), but will monitor clinical response with this dosing for now. -Can trend PA pressures via CardioMEMS while hospitalized -Can continue IV NTG for now  CAD s/p CABG and multiple PCI Elevated troponin Most recent LHC in 10/2017 with DES to sequential SVG-OM1/PLOM in 9/19. The patient's ECG is largely stable and troponin is minimally elevated. ACS is unlikely to be the cause of her clinical presentation -Continue clopidogrel  -Can convert apixaban to heparin gtt while hospitalized in case of procedure -Continue metoprolol, ranolazine when extubated  Hypertension SBP significantly elevated in the ED, likely in part due to acute distress. As above, BP may be contributing to flash pulmonary edema. -Continue NTG infusion for now -Continue home clonidine to avoid rebound -Can start the rest of oral agents as tapering of NTG infusion  AF Currently in NSR -Can convert apixaban to heparin gtt while hospitalized in case of procedure  Hyperlipidemia:  -Continue rosuvastatin. Can restart zetia when extubated. -On Praluent as outpatient      For questions or updates, please contact [place] Please consult [email or web address] for contact info under     Signed, Nila Nephew, MD  03/25/2018 9:14 PM

## 2018-03-25 NOTE — ED Triage Notes (Signed)
PT from home with reported  Vomiting and cough for one week. Pt also refeports she fell out of bath tub last night onto knees . Pt reports she now has pain to both knees from fall. Pt also reports ABD pain  From coughing. Anders Odden also observed to have skin color change

## 2018-03-25 NOTE — Progress Notes (Signed)
ANTICOAGULATION CONSULT NOTE - Follow Up Consult  Pharmacy Consult for Heparin Indication: atrial fibrillation  Allergies  Allergen Reactions  . Amoxicillin Shortness Of Breath and Rash  . Brilinta [Ticagrelor] Shortness Of Breath  . Erythromycin Shortness Of Breath, Other (See Comments) and Hives    Trouble swallowing  . Flagyl [Metronidazole] Palpitations and Shortness Of Breath  . Penicillins Hives, Shortness Of Breath, Rash and Other (See Comments)    Has patient had a PCN reaction causing immediate rash, facial/tongue/throat swelling, SOB or lightheadedness with hypotension: Yes Has patient had a PCN reaction causing severe rash involving mucus membranes or skin necrosis: No Has patient had a PCN reaction that required hospitalization: Yes Has patient had a PCN reaction occurring within the last 10 years: No If all of the above answers are "NO", then may proceed with Cephalosporin use.   . Isosorbide Mononitrate [Isosorbide Nitrate] Other (See Comments)    Joint aches, muscles hurt, difficult to walk   . Jardiance [Empagliflozin] Other (See Comments)    Nausea, joint aches, muscles aches  . Metformin And Related Other (See Comments)    Stomach pain, cold sweats, joint pain, burred vision, dizziness  . Tape Other (See Comments)    Skin pulls off with certain types Plastic tape causes skin to rip if left on for long periods of time  . Erythromycin Base Rash    Patient Measurements: Height: 5' 2.5" (158.8 cm) Weight: (!) 308 lb (139.7 kg) IBW/kg (Calculated) : 51.25 Heparin Dosing Weight: 88  Vital Signs: Temp: 102.6 F (39.2 C) (02/22 1543) Temp Source: Rectal (02/22 1543) BP: 119/59 (02/22 2045) Pulse Rate: 68 (02/22 2045)  Labs: Recent Labs    03/25/18 1630 03/25/18 1645  HGB 13.4 14.3  HCT 43.9 42.0  PLT 104*  --   LABPROT 18.8*  --   INR 1.60  --   CREATININE 1.34*  --   TROPONINI 0.05*  --     Estimated Creatinine Clearance: 53.5 mL/min (A) (by C-G  formula based on SCr of 1.34 mg/dL (H)).  Assessment: 73 year old female to begin heparin for Afib No anti-coag prior to admission  Goal of Therapy:  Heparin level 0.3-0.7 units/ml Monitor platelets by anticoagulation protocol: Yes   Plan:  Heparin 4000 units iv bolus x 1 Heparin drip at 1300 units / hr Daily heparin level, CBC  Thank you Anette Guarneri, PharmD [phone]  Tad Moore 03/25/2018,9:15 PM

## 2018-03-25 NOTE — ED Provider Notes (Signed)
Endotracheal Intubation Procedure Note  Indication for endotracheal intubation: respiratory failure. Airway Assessment: Mallampati Class: III (soft and hard palate and base of uvula visible). Sedation: etomidate. Paralytic: succinylcholine. Lidocaine: no. Atropine: no. Equipment: Glide scope. Cricoid Pressure: no. Number of attempts: 1. ETT location confirmed by by auscultation, by CXR and ETCO2 monitor.  [name] [name] 03/25/2018     [name] Aloe, MD 03/25/18 1548    Charlesetta Shanks, MD 03/26/18 [phone]

## 2018-03-26 ENCOUNTER — Inpatient Hospital Stay (HOSPITAL_COMMUNITY): Payer: Medicare Other

## 2018-03-26 ENCOUNTER — Inpatient Hospital Stay: Payer: Self-pay

## 2018-03-26 ENCOUNTER — Other Ambulatory Visit: Payer: Self-pay

## 2018-03-26 DIAGNOSIS — I371 Nonrheumatic pulmonary valve insufficiency: Secondary | ICD-10-CM

## 2018-03-26 DIAGNOSIS — I5021 Acute systolic (congestive) heart failure: Secondary | ICD-10-CM

## 2018-03-26 DIAGNOSIS — G9340 Encephalopathy, unspecified: Secondary | ICD-10-CM

## 2018-03-26 DIAGNOSIS — J810 Acute pulmonary edema: Secondary | ICD-10-CM

## 2018-03-26 DIAGNOSIS — I509 Heart failure, unspecified: Secondary | ICD-10-CM

## 2018-03-26 DIAGNOSIS — J9621 Acute and chronic respiratory failure with hypoxia: Secondary | ICD-10-CM

## 2018-03-26 DIAGNOSIS — I361 Nonrheumatic tricuspid (valve) insufficiency: Secondary | ICD-10-CM

## 2018-03-26 LAB — LACTIC ACID, PLASMA: Lactic Acid, Venous: 1.7 mmol/L (ref 0.5–1.9)

## 2018-03-26 LAB — BASIC METABOLIC PANEL
Anion gap: 18 — ABNORMAL HIGH (ref 5–15)
BUN: 22 mg/dL (ref 8–23)
CO2: 22 mmol/L (ref 22–32)
Calcium: 8.5 mg/dL — ABNORMAL LOW (ref 8.9–10.3)
Chloride: 101 mmol/L (ref 98–111)
Creatinine, Ser: 1.68 mg/dL — ABNORMAL HIGH (ref 0.44–1.00)
GFR calc Af Amer: 35 mL/min — ABNORMAL LOW (ref >60)
GFR calc non Af Amer: 30 mL/min — ABNORMAL LOW (ref >60)
Glucose, Bld: 135 mg/dL — ABNORMAL HIGH (ref 70–99)
Potassium: 4.9 mmol/L (ref 3.5–5.1)
Sodium: 141 mmol/L (ref 135–145)

## 2018-03-26 LAB — GLUCOSE, CAPILLARY
Glucose-Capillary: 151 mg/dL — ABNORMAL HIGH (ref 70–99)
Glucose-Capillary: 114 mg/dL — ABNORMAL HIGH (ref 70–99)
Glucose-Capillary: 170 mg/dL — ABNORMAL HIGH (ref 70–99)
Glucose-Capillary: 127 mg/dL — ABNORMAL HIGH (ref 70–99)
Glucose-Capillary: 124 mg/dL — ABNORMAL HIGH (ref 70–99)

## 2018-03-26 LAB — COOXEMETRY PANEL
Carboxyhemoglobin: 1.5 % (ref 0.5–1.5)
Methemoglobin: 1.3 % (ref 0.0–1.5)
O2 Saturation: 80.3 %
Total hemoglobin: 9.9 g/dL — ABNORMAL LOW (ref 12.0–16.0)

## 2018-03-26 LAB — CBC
HCT: 41 % (ref 36.0–46.0)
Hemoglobin: 13.1 g/dL (ref 12.0–15.0)
MCH: 31.5 pg (ref 26.0–34.0)
MCHC: 32 g/dL (ref 30.0–36.0)
MCV: 98.6 fL (ref 80.0–100.0)
Platelets: 86 10*3/uL — ABNORMAL LOW (ref 150–400)
RBC: 4.16 MIL/uL (ref 3.87–5.11)
RDW: 14.2 % (ref 11.5–15.5)
WBC: 11.4 10*3/uL — ABNORMAL HIGH (ref 4.0–10.5)
nRBC: 0 % (ref 0.0–0.2)

## 2018-03-26 LAB — PROCALCITONIN: Procalcitonin: 0.32 ng/mL

## 2018-03-26 LAB — PHOSPHORUS: Phosphorus: 3.6 mg/dL (ref 2.5–4.6)

## 2018-03-26 LAB — MAGNESIUM: Magnesium: 2.1 mg/dL (ref 1.7–2.4)

## 2018-03-26 LAB — HEPARIN LEVEL (UNFRACTIONATED)
Heparin Unfractionated: >2.2 IU/mL — ABNORMAL HIGH (ref 0.30–0.70)
Heparin Unfractionated: 2.1 IU/mL — ABNORMAL HIGH (ref 0.30–0.70)

## 2018-03-26 LAB — ECHOCARDIOGRAM COMPLETE
Height: 62.5 in
Weight: 2130.53 oz

## 2018-03-26 LAB — APTT
aPTT: 104 seconds — ABNORMAL HIGH (ref 24–36)
aPTT: 165 seconds (ref 24–36)

## 2018-03-26 LAB — MRSA PCR SCREENING: MRSA by PCR: NEGATIVE

## 2018-03-26 LAB — TROPONIN I: Troponin I: 0.48 ng/mL (ref <0.03)

## 2018-03-26 MED ORDER — HYDRALAZINE HCL 20 MG/ML IJ SOLN
10.0000 mg | INTRAMUSCULAR | Status: DC | PRN
  Administered 2018-03-29 (×2): 10 mg via INTRAVENOUS
  Filled 2018-03-26: qty 1

## 2018-03-26 MED ORDER — SODIUM CHLORIDE 0.9% FLUSH: 10.0000 mL | INTRAVENOUS | Status: DC | PRN

## 2018-03-26 MED ORDER — AMLODIPINE BESYLATE 10 MG PO TABS
10.0000 mg | ORAL_TABLET | Freq: Every day | ORAL | Status: DC
  Filled 2018-03-26: qty 1

## 2018-03-26 MED ORDER — FUROSEMIDE 10 MG/ML IJ SOLN
160.0000 mg | Freq: Two times a day (BID) | INTRAVENOUS | Status: DC
  Administered 2018-03-26 (×2): 160 mg via INTRAVENOUS
  Filled 2018-03-26: qty 10
  Filled 2018-03-26 (×3): qty 16

## 2018-03-26 MED ORDER — HYDRALAZINE HCL 20 MG/ML IJ SOLN: 10.0000 mg | INTRAMUSCULAR | Status: DC

## 2018-03-26 MED ORDER — SACUBITRIL-VALSARTAN 24-26 MG PO TABS
1.0000 | ORAL_TABLET | Freq: Two times a day (BID) | ORAL | Status: DC
  Administered 2018-03-26: 1 via ORAL
  Filled 2018-03-26: qty 1

## 2018-03-26 MED ORDER — SODIUM CHLORIDE 0.9 % IV SOLN
1.0000 g | INTRAVENOUS | Status: DC
  Administered 2018-03-26: 1 g via INTRAVENOUS
  Filled 2018-03-26 (×2): qty 1

## 2018-03-26 MED ORDER — BUPROPION HCL 75 MG PO TABS
75.0000 mg | ORAL_TABLET | Freq: Two times a day (BID) | ORAL | Status: DC
  Administered 2018-03-26 – 2018-03-31 (×11): 75 mg via ORAL
  Filled 2018-03-26 (×14): qty 1

## 2018-03-26 MED ORDER — PANTOPRAZOLE SODIUM 40 MG PO PACK
40.0000 mg | PACK | Freq: Two times a day (BID) | ORAL | Status: DC
  Administered 2018-03-26 – 2018-03-27 (×4): 40 mg
  Filled 2018-03-26 (×4): qty 20

## 2018-03-26 MED ORDER — VANCOMYCIN HCL IN DEXTROSE 1-5 GM/200ML-% IV SOLN: 1000.0000 mg | INTRAVENOUS | Status: DC

## 2018-03-26 MED ORDER — METOLAZONE 2.5 MG PO TABS
2.5000 mg | ORAL_TABLET | Freq: Once | ORAL | Status: AC
  Administered 2018-03-26: 2.5 mg via ORAL
  Filled 2018-03-26: qty 1

## 2018-03-26 MED ORDER — SPIRONOLACTONE 12.5 MG HALF TABLET
12.5000 mg | ORAL_TABLET | Freq: Every day | ORAL | Status: DC
  Administered 2018-03-26: 12.5 mg via ORAL
  Filled 2018-03-26: qty 1

## 2018-03-26 MED ORDER — ACETAMINOPHEN 160 MG/5ML PO SOLN
650.0000 mg | Freq: Four times a day (QID) | ORAL | Status: DC | PRN
  Administered 2018-03-26: 650 mg
  Filled 2018-03-26 (×2): qty 20.3

## 2018-03-26 MED ORDER — ORAL CARE MOUTH RINSE
15.0000 mL | OROMUCOSAL | Status: DC
  Administered 2018-03-26 – 2018-03-28 (×21): 15 mL via OROMUCOSAL

## 2018-03-26 MED ORDER — CLONIDINE HCL 0.2 MG PO TABS
0.2000 mg | ORAL_TABLET | Freq: Two times a day (BID) | ORAL | Status: DC
  Administered 2018-03-26: 0.2 mg via NASOGASTRIC
  Filled 2018-03-26: qty 1

## 2018-03-26 MED ORDER — GABAPENTIN 250 MG/5ML PO SOLN
1200.0000 mg | Freq: Every day | ORAL | Status: DC
  Administered 2018-03-26 – 2018-03-28 (×3): 1200 mg via ORAL
  Filled 2018-03-26 (×4): qty 24

## 2018-03-26 MED ORDER — ONDANSETRON HCL 4 MG/2ML IJ SOLN
4.0000 mg | Freq: Four times a day (QID) | INTRAMUSCULAR | Status: DC | PRN
  Administered 2018-03-26 – 2018-03-27 (×2): 4 mg via INTRAVENOUS
  Filled 2018-03-26 (×2): qty 2

## 2018-03-26 MED ORDER — GABAPENTIN 250 MG/5ML PO SOLN
600.0000 mg | Freq: Two times a day (BID) | ORAL | Status: DC
  Administered 2018-03-26 – 2018-03-29 (×5): 600 mg via ORAL
  Filled 2018-03-26 (×7): qty 12

## 2018-03-26 MED ORDER — FUROSEMIDE 10 MG/ML IJ SOLN
200.0000 mg | Freq: Two times a day (BID) | INTRAVENOUS | Status: DC
  Administered 2018-03-26: 200 mg via INTRAVENOUS
  Filled 2018-03-26 (×2): qty 20

## 2018-03-26 MED ORDER — ACETAMINOPHEN 160 MG/5ML PO SOLN
1000.0000 mg | Freq: Four times a day (QID) | ORAL | Status: DC | PRN
  Administered 2018-03-26 – 2018-03-27 (×2): 1000 mg
  Filled 2018-03-26: qty 40.6

## 2018-03-26 MED ORDER — FUROSEMIDE 10 MG/ML IJ SOLN
160.0000 mg | Freq: Two times a day (BID) | INTRAVENOUS | Status: DC
  Filled 2018-03-26: qty 16

## 2018-03-26 MED ORDER — VANCOMYCIN HCL 10 G IV SOLR
1250.0000 mg | Freq: Once | INTRAVENOUS | Status: AC
  Administered 2018-03-26: 1250 mg via INTRAVENOUS
  Filled 2018-03-26 (×2): qty 1250

## 2018-03-26 MED ORDER — SODIUM CHLORIDE 0.9 % IV SOLN
INTRAVENOUS | Status: DC | PRN
  Administered 2018-03-26 – 2018-04-05 (×5): via INTRAVENOUS

## 2018-03-26 MED ORDER — SODIUM CHLORIDE 0.9% FLUSH
10.0000 mL | Freq: Two times a day (BID) | INTRAVENOUS | Status: DC
  Administered 2018-03-26 – 2018-03-27 (×2): 20 mL
  Administered 2018-03-27 – 2018-04-01 (×8): 10 mL
  Administered 2018-04-01: 20 mL
  Administered 2018-04-01 – 2018-04-10 (×15): 10 mL

## 2018-03-26 MED ORDER — NOREPINEPHRINE 4 MG/250ML-% IV SOLN: 0.0000 ug/min | INTRAVENOUS | Status: DC

## 2018-03-26 MED ORDER — CHLORHEXIDINE GLUCONATE CLOTH 2 % EX PADS
6.0000 | MEDICATED_PAD | Freq: Every day | CUTANEOUS | Status: DC
  Administered 2018-03-27 – 2018-04-09 (×12): 6 via TOPICAL

## 2018-03-26 MED ORDER — CHLORHEXIDINE GLUCONATE 0.12% ORAL RINSE (MEDLINE KIT)
15.0000 mL | Freq: Two times a day (BID) | OROMUCOSAL | Status: DC
  Administered 2018-03-26 – 2018-04-01 (×8): 15 mL via OROMUCOSAL

## 2018-03-26 NOTE — Progress Notes (Signed)
NAME:  [name] [name], [name]:  [id number], DOB:  [birth year], LOS: 1 ADMISSION DATE:  03/25/2018, CONSULTATION DATE:  03/25/2018 REFERRING MD:  ED physician, CHIEF COMPLAINT:  Acute hypoxic respiratory failure   Brief History   73 year old woman with history of asthma, OSA not on CPAP, chronic hypoxic respiratory failure on 2L at night, CAD s/p CABG and multiple PCI, HFpEF s/p CardioMEMS, AF on Eliquis, cerebrovascular disease, HTN, CKD, DM2, PAD presenting with cough found in the ED to have fever, hypertensive urgency, and acute on chronic hypoxic respiratory failure. She was intubated in the ED and given IV Lasix, aztreonam and NTG gtt.  History of present illness   73 year old woman with history of asthma, OSA not on CPAP, chronic hypoxic respiratory failure on 2L at night, CAD s/p CABG and multiple PCI, HFpEF s/p CardioMEMS, AF on Eliquis, cerebrovascular disease, HTN, CKD, DM2, PAD presenting with cough. History obtained from daughter, husband, and EMR due to intubation and sedation. She has been feeling poorly for the past week with cough productive of sputum. She has been having intermittent emesis and post tussive emesis. She has not been able to take her medications because of her emesis. She had a fall yesterday because of weakness. She was noted to be hypoxic to the 70s this afternoon when her daughter checked.   She lives at home with her husband. She is only able to walk a few feet before she gets dyspneic. She just recently got a rollator to help with mobility. Her husband is her HCPOA.  In the ED, pt febrile to 102.6 with SBP 170-209, HR 70s, O2 in the mid 80s. ECG showed sinus rhythm with LBBB-like IVCD. VBG 7.38/50. Labs included Cr 1.34 (at/improved from baseline), BNP 1480 (last 701 in 01/2018), troponin 0.05. Lactate 1.8. WBC 9.4. Flu panel negative. CXR showed cardiomegaly and bilateral infiltrates. She was intubated in the ED and started on IV aztreonam. IV furosemide was given, and  NTG infusion was started for BP control.   Past Medical History  asthma, OSA not on CPAP, chronic hypoxic respiratory failure on 2L at night, CAD s/p CABG and multiple PCI, HFpEF s/p CardioMEMS, AF on Eliquis, cerebrovascular disease, HTN, CKD, DM2, PAD  Significant Hospital Events   2/22 > admit, intubated  Consults:  Cardiology 2/22  Procedures:  None  Significant Diagnostic Tests:  CXR 2/22 > Cardiomegaly. Worsening bilateral airspace disease  Micro Data:  BCx x 2 2/22 >> Resp cx 2/22 >>  UCx 2/22 >> Influenza PCR 2/22 >> neg  Antimicrobials:  Aztreonam 2/22 Levaquin 2/22 >>  Interim history/subjective:  Sedated and intubated, no events overnight. Continues to have pulmonary edema fluid in the ETT  Objective   Blood pressure (!) 170/62, pulse 91, temperature (!) 100.9 F (38.3 C), temperature source Axillary, resp. rate 19, height 5' 2.5" (1.588 m), weight 60.4 kg, SpO2 91 %.    Vent Mode: CPAP;PSV FiO2 (%):  [50 %-80 %] 50 % Set Rate:  [14 bmp-15 bmp] 15 bmp Vt Set:  [410 mL-550 mL] 410 mL PEEP:  [5 cmH20] 5 cmH20 Plateau Pressure:  [19 cmH20-39 cmH20] 19 cmH20   Intake/Output Summary (Last 24 hours) at 03/26/2018 1130 Last data filed at 03/26/2018 0600 Gross per 24 hour  Intake 336.51 ml  Output 550 ml  Net -213.49 ml   Filed Weights   03/25/18 1546 03/25/18 2315 03/26/18 0500  Weight: (!) 139.7 kg 60.4 kg 60.4 kg    Examination: General: Sedated and intubated, acutely  ill appearing female HENT: /AT, PERRL, EOM-I and MMM Lungs: Diffuse crackles noted Cardiovascular: RRR, Nl S1/S2 and -M/R/G Abdomen: Soft, nondistended, nontender Extremities: BLE 2+ pitting edema Neuro: Sedated GU: Foley in place  Assessment & Plan:  73 year old woman with history of asthma, OSA not on CPAP, chronic hypoxic respiratory failure on 2L at night, CAD s/p CABG and multiple PCI, HFpEF s/p CardioMEMS, AF on Eliquis, cerebrovascular disease, HTN, CKD, DM2, PAD presenting  with cough found in the ED to have fever, hypertensive urgency, and acute on chronic hypoxic respiratory failure.   Acute on chronic hypoxic respiratory failure, acute on chronic HFpEF: BNP increased to 1480 from 701 2 months ago. Dry weight appears to be 295 lbs and today she is 308. - Cardiology following, appreciate recs - Lasix 160mg  IV BID per cardiology, continue active diureses - Strict in/out, daily weights - Full vent support for now until diuresed - Insert PICC line then f/u CVP  Possible CAP, asthma: Fever to 102.6 in ED. No leukocytosis. Influenza PCR neg.  - Continue home inhaler as Brovana and Pulmicort nebs BID once off the vent - Switch to Levaquin for possible CAP - Obtain resp culture, follow up blood cultures and urine culture  Hypertensive urgency, lactic acidosis: BP up to 209/123 (MAP 152) in ED. Dropped as low as 84/45 (MAP 58) after patient was intubated and sedated. Off NTG gtt now. - Holding home antihypertensives given drop in blood pressure.  - Continued clonidine to start tomorrow as a lower dose to avoid rebound - Switch from propofol to fent gtt for sedation  - Mild trop leak likely from demand ischemia. Trend trops.  CAD s/p CABG and multiple PCI: Continue home plavix, Zetia, statin, Ranexa  AF on Eliquis: continue home amio. Hold home Eliquis and continue heparin gtt.  CKD3: Cr below baseline of 1.7.  DM2:  - Continue home gabapentin and renally adjusted dose - Continue home Lantus at reduced dose, SSI with monitoring  Best practice:  Diet: NPO Pain/Anxiety/Delirium protocol (if indicated): fent gtt and prn VAP protocol (if indicated): ordered DVT prophylaxis: hep gtt GI prophylaxis: PPI Glucose control: SSI Mobility: OOB Code Status: FULL Family Communication: Discussed at bedside with daughter and husband Disposition: ICU  Labs   CBC: Recent Labs  Lab 03/25/18 1630 03/25/18 1645 03/25/18 2148 03/26/18 0522  WBC 9.4  --   --  11.4*    NEUTROABS 8.1*  --   --   --   HGB 13.4 14.3 11.9* 13.1  HCT 43.9 42.0 35.0* 41.0  MCV 101.4*  --   --  98.6  PLT 104*  --   --  86*    Basic Metabolic Panel: Recent Labs  Lab 03/20/18 1220 03/25/18 1630 03/25/18 1645 03/25/18 2148 03/26/18 0522  NA 142 138 139 138 141  K 4.3 4.1 4.1 4.2 4.9  CL 100 101  --   --  101  CO2 31 26  --   --  22  GLUCOSE 106* 166*  --   --  135*  BUN 26* 15  --   --  22  CREATININE 1.73* 1.34*  --   --  1.68*  CALCIUM 9.1 9.1  --   --  8.5*  MG  --  2.0  --   --  2.1  PHOS  --  2.8  --   --  3.6   GFR: Estimated Creatinine Clearance: 25.2 mL/min (A) (by C-G formula based on SCr of 1.68 mg/dL (  H)). Recent Labs  Lab 03/25/18 1630 03/25/18 2126 03/26/18 0003 03/26/18 0522  WBC 9.4  --   --  11.4*  LATICACIDVEN 1.8 2.2* 1.7  --     Liver Function Tests: Recent Labs  Lab 03/25/18 1630  AST 38  ALT 22  ALKPHOS 72  BILITOT 1.5*  PROT 7.3  ALBUMIN 3.9   Recent Labs  Lab 03/25/18 1630  LIPASE 19   ABG    Component Value Date/Time   PHART 7.335 (L) 03/25/2018 2148   PCO2ART 53.6 (H) 03/25/2018 2148   PO2ART 103.0 03/25/2018 2148   HCO3 28.6 (H) 03/25/2018 2148   TCO2 30 03/25/2018 2148   ACIDBASEDEF 2.0 01/28/2010 1651   O2SAT 97.0 03/25/2018 2148     Coagulation Profile: Recent Labs  Lab 03/25/18 1630  INR 1.60    Cardiac Enzymes: Recent Labs  Lab 03/25/18 1630 03/25/18 2230 03/26/18 0522  TROPONINI 0.05* 0.40* 0.48*    HbA1C: Hgb A1c MFr Bld  Date/Time Value Ref Range Status  10/21/2017 05:12 AM 8.0 (H) 4.8 - 5.6 % Final    Comment:    (NOTE) Pre diabetes:          5.7%-6.4% Diabetes:              >6.4% Glycemic control for   <7.0% adults with diabetes   07/18/2013 08:16 PM 7.2 (H) <5.7 % Final    Comment:    (NOTE)                                                                       According to the ADA Clinical Practice Recommendations for 2011, when HbA1c is used as a screening test:  >=6.5%    Diagnostic of Diabetes Mellitus           (if abnormal result is confirmed) 5.7-6.4%   Increased risk of developing Diabetes Mellitus References:Diagnosis and Classification of Diabetes Mellitus,Diabetes PTWS,5681,27(NTZGY 1):S62-S69 and Standards of Medical Care in         Diabetes - 2011,Diabetes Care,2011,34 (Suppl 1):S11-S61.    Review of Systems:   Unable to obtain due to sedation  Past Medical History  She,  has a past medical history of Abnormality of gait (05/14/2014), Anemia, Anginal pain ([place]), Anxiety, Asthma, Basal cell carcinoma (05/2014), Bundle branch block, left, CHF (congestive heart failure) ([place]), Chronic bronchitis ([place]), Chronic insomnia (05/06/2015), Chronic kidney disease, Chronic low back pain (08/24/2016), Chronic lower back pain, Claustrophobia, Common migraine (05/14/2014), Coronary artery disease, Depression, Dysrhythmia, GERD (gastroesophageal reflux disease), H/O hiatal hernia, Headache, Heart murmur, Hyperlipidemia, Hypertension, Irregular heart beat, Leg cramps, Melanoma ([place]) (05/2014), Memory change (05/14/2014), Migraine, Myocardial infarction ([place]) (04/1999, 02/2000, 01/2005; 2011; 2014), Neuromuscular disorder ([place]), Obesity (01-2010), Obstructive sleep apnea, On home oxygen therapy, Osteoarthritis, Other and unspecified angina pectoris, Peripheral vascular disease ([place]), Pneumonia (2000's), PONV (postoperative nausea and vomiting), Shortness of breath, Stroke ([place]) (03/22/12), Stroke [facility]) (Oct. 2015), Stroke [facility]) (10/2014), Type II diabetes mellitus ([place]), Ventral hernia, Vomiting, and Vomiting blood.   Surgical History    Past Surgical History:  Procedure Laterality Date  . ANKLE FRACTURE SURGERY Left 1970's  . APPENDECTOMY  1970's   w/hysterectomy  . BASAL CELL CARCINOMA EXCISION Left 05/2014   "shoulder" (01/16/2018)  .  CARDIAC CATHETERIZATION  10/10/2012   Dr [name].  Marland Kitchen CARDIAC CATHETERIZATION N/A 05/29/2015   Procedure: Right/Left Heart Cath and  Coronary/Graft Angiography;  Surgeon: Larey Dresser, MD;  Location: [facility];  Service: Cardiovascular;  Laterality: N/A;  . CAROTID ENDARTERECTOMY Left 03/2013  . CAROTID STENT INSERTION Left 03/20/2013   Procedure: CAROTID STENT INSERTION;  Surgeon: [name], MD;  Location: [facility] CATH LAB;  Service: Cardiovascular;  Laterality: Left;  internal carotid  . CEREBRAL ANGIOGRAM N/A 04/05/2011   Procedure: CEREBRAL ANGIOGRAM;  Surgeon: Angelia Mould, MD;  Location: [facility] CATH LAB;  Service: Cardiovascular;  Laterality: N/A;  . CHOLECYSTECTOMY OPEN  2004  . CORONARY ANGIOPLASTY WITH STENT PLACEMENT  01,02,05,06,07,08,11; 04/24/2013   "I've probably got ~ 10 stents by now" (04/24/2013)  . CORONARY ANGIOPLASTY WITH STENT PLACEMENT  06/13/2013   "got 4 stents today" (06/13/2013)  . CORONARY ARTERY BYPASS GRAFT  1220/11   "CABG X5"  . CORONARY STENT INTERVENTION N/A 10/20/2017   Procedure: CORONARY STENT INTERVENTION;  Surgeon: Troy Sine, MD;  Location: [place] [facility];  Service: Cardiovascular;  Laterality: N/A;  . ESOPHAGOGASTRODUODENOSCOPY  08/03/2011   Procedure: ESOPHAGOGASTRODUODENOSCOPY (EGD);  Surgeon: Shann Medal, MD;  Location: [facility];  Service: General;  Laterality: N/A;  . ESOPHAGOGASTRODUODENOSCOPY (EGD) WITH PROPOFOL N/A 03/11/2014   Procedure: ESOPHAGOGASTRODUODENOSCOPY (EGD) WITH PROPOFOL;  Surgeon: Lafayette Dragon, MD;  Location: [facility] ENDOSCOPY;  Service: Endoscopy;  Laterality: N/A;  . FRACTURE SURGERY    . gall stone removal  05/2003  . GASTRIC RESTRICTION SURGERY  1984   "stapeling"  . HERNIA REPAIR  2004   "in my stomach; had OR on it twice", wire mesh on 1 hernia  . LEFT HEART CATH AND CORS/GRAFTS ANGIOGRAPHY N/A 07/09/2016   Procedure: LEFT HEART CATH AND CORS/GRAFTS ANGIOGRAPHY;  Surgeon: Larey Dresser, MD;  Location: [facility];  Service: Cardiovascular;  Laterality: N/A;  . OVARY SURGERY  1970's   "tumor removed"  . PERCUTANEOUS CORONARY  STENT INTERVENTION (PCI-S) N/A 06/13/2013   Procedure: PERCUTANEOUS CORONARY STENT INTERVENTION (PCI-S);  Surgeon: [name], MD;  Location: [facility] CATH LAB;  Service: Cardiovascular;  Laterality: N/A;  . PERCUTANEOUS STENT INTERVENTION N/A 04/24/2013   Procedure: PERCUTANEOUS STENT INTERVENTION;  Surgeon: [name], MD;  Location: [facility] C[facility] LAB;  Service: Cardiovascular;  Laterality: N/A;  . RIGHT/LEFT HEART CATH AND CORONARY/GRAFT ANGIOGRAPHY N/A 10/20/2017   Procedure: RIGHT/LEFT HEART CATH AND CORONARY/GRAFT ANGIOGRAPHY;  Surgeon: Larey Dresser, MD;  Location: [facility];  Service: Cardiovascular;  Laterality: N/A;  . ROOT CANAL  10/2000  . TIBIA FRACTURE SURGERY Right 1970's   rods and pins  . TOOTH EXTRACTION     "1 on the upper; wisdom tooth on the lower" (01/16/2018)  . TOTAL ABDOMINAL HYSTERECTOMY  1970's   w/ appendectomy     Social History   reports that she has never smoked. She has never used smokeless tobacco. She reports that she does not drink alcohol or use drugs.   Family History   Her family history includes AAA (abdominal aortic aneurysm) in her father and mother; Cancer in her sister; Deep vein thrombosis in her father and mother; Diabetes in her father, paternal aunt, paternal grandmother, paternal uncle, and paternal uncle; Heart attack in her father; Heart attack (age of onset: 57) in her paternal grandfather; Heart disease in her father and paternal uncle; Hyperlipidemia in her father; Hypertension in her father, mother, and  sister. There is no history of Colon cancer.   Allergies Allergies  Allergen Reactions  . Amoxicillin Shortness Of Breath and Rash  . Brilinta [Ticagrelor] Shortness Of Breath  . Erythromycin Shortness Of Breath, Other (See Comments) and Hives    Trouble swallowing  . Flagyl [Metronidazole] Palpitations and Shortness Of Breath  . Penicillins Hives, Shortness Of Breath, Rash and Other (See Comments)    Has patient had a  PCN reaction causing immediate rash, facial/tongue/throat swelling, SOB or lightheadedness with hypotension: Yes Has patient had a PCN reaction causing severe rash involving mucus membranes or skin necrosis: No Has patient had a PCN reaction that required hospitalization: Yes Has patient had a PCN reaction occurring within the last 10 years: No If all of the above answers are "NO", then may proceed with Cephalosporin use.   . Isosorbide Mononitrate [Isosorbide Nitrate] Other (See Comments)    Joint aches, muscles hurt, difficult to walk   . Jardiance [Empagliflozin] Other (See Comments)    Nausea, joint aches, muscles aches  . Metformin And Related Other (See Comments)    Stomach pain, cold sweats, joint pain, burred vision, dizziness  . Tape Other (See Comments)    Skin pulls off with certain types Plastic tape causes skin to rip if left on for long periods of time  . Erythromycin Base Rash     Home Medications  Prior to Admission medications   Medication Sig Start Date End Date Taking? Authorizing Provider  acetaminophen (TYLENOL) 500 MG tablet Take 1,000 mg by mouth every 6 (six) hours as needed for mild pain or headache.    Yes [provider]  albuterol (PROVENTIL) (2.5 MG/3ML) 0.083% nebulizer solution Take 2.5 mg by nebulization every 4 (four) hours as needed for wheezing or shortness of breath.    Yes [provider]  albuterol (PROVENTIL,VENTOLIN) 90 MCG/ACT inhaler Inhale 2 puffs into the lungs every 4 (four) hours as needed for wheezing or shortness of breath.    Yes [provider]  Alirocumab (PRALUENT) 150 MG/ML SOPN Inject 1 pen into the skin every 14 (fourteen) days. 04/07/16  Yes Larey Dresser, MD  ALPRAZolam Duanne Moron) 0.5 MG tablet Take 0.5-1 mg by mouth See admin instructions. Take 0.5 mg by mouth in the morning. Take 0.5 mg by mouth at noon if needed for anxiety. Take 1 mg by mouth at bedtime.   Yes [provider]  amiodarone  (PACERONE) 100 MG tablet Take 1 tablet (100 mg total) by mouth daily. 02/08/18  Yes Georgiana Shore, NP  amLODipine (NORVASC) 10 MG tablet Take 10 mg by mouth at bedtime.  01/20/15  Yes [provider]  apixaban (ELIQUIS) 5 MG TABS tablet Take 1 tablet (5 mg total) by mouth 2 (two) times daily. 02/08/18  Yes Georgiana Shore, NP  b complex vitamins capsule Take 1 capsule by mouth daily.   Yes [provider]  buPROPion (WELLBUTRIN XL) 150 MG 24 hr tablet Take 150 mg by mouth daily.   Yes [provider]  Calcium Carbonate 1500 (600 CA) MG TABS Take 1,500 mg by mouth daily with breakfast.   Yes [provider]  Cholecalciferol (VITAMIN D) 2000 UNITS tablet Take 2,000 Units by mouth daily with breakfast.    Yes [provider]  cloNIDine (CATAPRES) 0.2 MG tablet Take 1 tablet (0.2 mg total) by mouth 2 (two) times daily. 11/03/17 11/03/18 Yes Clegg, Amy D, NP  clopidogrel (PLAVIX) 75 MG tablet TAKE ONE (1) TABLET  BY MOUTH EVERY DAY 02/09/18  Yes Bensimhon, Shaune Pascal, MD  denosumab (PROLIA) 60 MG/ML SOLN injection Inject 60 mg into the skin every 6 (six) months. Administer in upper arm, thigh, or abdomen   Yes [provider]  DULoxetine (CYMBALTA) 30 MG capsule Take 1 capsule (30 mg total) by mouth 2 (two) times daily. Patient taking differently: Take 30 mg by mouth daily.  07/15/17  Yes Kathrynn Ducking, MD  ezetimibe (ZETIA) 10 MG tablet TAKE ONE (1) TABLET EACH DAY Patient taking differently: Take 10 mg by mouth at bedtime.  07/16/16  Yes Bensimhon, Shaune Pascal, MD  Fluticasone-Salmeterol (ADVAIR DISKUS) 250-50 MCG/DOSE AEPB Inhale 1 puff into the lungs 2 (two) times daily.    Yes [provider]  gabapentin (NEURONTIN) 600 MG tablet Take 600-1,200 mg by mouth See admin instructions. Take 600 mg by mouth in the morning and take 600 mg by mouth in the afternoon. Take 1200 mg by mouth at bedtime.   Yes [provider]  HUMALOG KWIKPEN 100  UNIT/ML SOPN Inject 18-26 Units into the skin 3 (three) times daily before meals. Per sliding scale 09/19/12  Yes [provider]  HYDROcodone-acetaminophen (NORCO/VICODIN) 5-325 MG per tablet Take 1 tablet by mouth at bedtime.    Yes [provider]  ipratropium-albuterol (DUONEB) 0.5-2.5 (3) MG/3ML SOLN Take 3 mLs by nebulization every 4 (four) hours as needed (for severe shortness of breath or wheezing).    Yes [provider]  loperamide (IMODIUM) 2 MG capsule Take 2 mg by mouth 2 (two) times daily as needed for diarrhea or loose stools.    Yes [provider]  losartan (COZAAR) 100 MG tablet Take 100 mg by mouth daily.   Yes [provider]  magnesium oxide (MAG-OX) 400 MG tablet Take 400 mg by mouth 2 (two) times daily.   Yes [provider]  methocarbamol (ROBAXIN) 500 MG tablet TAKE ONE (1) TABLET THREE (3) TIMES EACH DAY 08/24/16  Yes Kathrynn Ducking, MD  metolazone (ZAROXOLYN) 2.5 MG tablet Take 1 tablet (2.5 mg total) by mouth once a week. Take 1 Tablet once weekly on Mondays. Patient taking differently: Take 2.5 mg by mouth every Monday.  11/22/17  Yes Larey Dresser, MD  metoprolol succinate (TOPROL-XL) 25 MG 24 hr tablet Take 1 tablet (25 mg total) by mouth daily. 01/20/18  Yes Clegg, Amy D, NP  Multiple Vitamin (MULTIVITAMIN) capsule Take 1 capsule by mouth daily.   Yes [provider]  multivitamin-lutein (OCUVITE-LUTEIN) CAPS capsule Take 1 capsule by mouth daily.   Yes [provider]  nitroGLYCERIN (NITROSTAT) 0.3 MG SL tablet Place 1 tablet (0.3 mg total) under the tongue every 5 (five) minutes x 3 doses as needed for chest pain. 09/15/17  Yes Georgiana Shore, NP  ondansetron (ZOFRAN) 4 MG tablet Take 1 tablet (4 mg total) by mouth 2 (two) times daily as needed. For nausea Patient taking differently: Take 4 mg by mouth 2 (two) times daily.  02/12/14  Yes Lafayette Dragon, MD  OXYGEN Inhale 2 L into the lungs at  bedtime.    Yes [provider]  pantoprazole (PROTONIX) 40 MG tablet Take 1 tablet (40 mg total) by mouth 2 (two) times daily. 04/26/16  Yes Irene Shipper, MD  Polyvinyl Alcohol-Povidone PF (REFRESH) 1.4-0.6 % SOLN Place 2 drops into both eyes 2 (two) times daily as needed (for dry eyes).   Yes [provider]  potassium chloride SA (  K-DUR,KLOR-CON) 20 MEQ tablet Take 1 tablet (20 mEq total) by mouth 2 (two) times daily. 12/14/17  Yes Larey Dresser, MD  ranolazine (RANEXA) 1000 MG SR tablet Take 1 tablet (1,000 mg total) by mouth 2 (two) times daily. 02/08/18  Yes Georgiana Shore, NP  rosuvastatin (CRESTOR) 40 MG tablet TAKE ONE (1) TABLET EACH DAY Patient taking differently: Take 40 mg by mouth at bedtime.  12/10/16  Yes Bensimhon, Shaune Pascal, MD  torsemide (DEMADEX) 20 MG tablet Take 5 tablets (100 mg total) by mouth 2 (two) times daily. 01/19/18 03/25/18 Yes Clegg, Amy D, NP  triamcinolone cream (KENALOG) 0.1 % Apply 1 application topically 2 (two) times daily.    Yes [provider]  vitamin B-12 (CYANOCOBALAMIN) 1000 MCG tablet Take 1,000 mcg by mouth daily.   Yes [provider]  zinc gluconate 50 MG tablet Take 50 mg by mouth daily.    Yes [provider]  zolpidem (AMBIEN) 10 MG tablet Take 10 mg by mouth at bedtime.    Yes [provider]  Insulin Glargine (LANTUS SOLOSTAR) 100 UNIT/ML Solostar Pen Inject 50 Units into the skin at bedtime.     [provider]    The patient is critically ill with multiple organ systems failure and requires high complexity decision making for assessment and support, frequent evaluation and titration of therapies, application of advanced monitoring technologies and extensive interpretation of multiple databases.   Critical Care Time devoted to patient care services described in this note is  33  Minutes. This time reflects time of care of this signee Dr Jennet Maduro. This critical care time does not  reflect procedure time, or teaching time or supervisory time of PA/NP/Med student/Med Resident etc but could involve care discussion time.  Rush Farmer, M.D. [facility] Pulmonary/Critical Care Medicine. Pager: [phone]. After hours pager: [phone].

## 2018-03-26 NOTE — Progress Notes (Signed)
Echocardiogram 2D Echocardiogram has been performed.  [name] [name] 03/26/2018, 8:56 AM

## 2018-03-26 NOTE — Progress Notes (Signed)
Peripherally Inserted Central Catheter/Midline Placement  The IV Nurse has discussed with the patient and/or persons authorized to consent for the patient, the purpose of this procedure and the potential benefits and risks involved with this procedure.  The benefits include less needle sticks, lab draws from the catheter, and the patient may be discharged home with the catheter. Risks include, but not limited to, infection, bleeding, blood clot (thrombus formation), and puncture of an artery; nerve damage and irregular heartbeat and possibility to perform a PICC exchange if needed/ordered by physician.  Alternatives to this procedure were also discussed.  Bard Power PICC patient education guide, fact sheet on infection prevention and patient information card has been provided to patient /or left at bedside.  Husband signed consnet  PICC/Midline Placement Documentation  PICC Triple Lumen 03/26/18 PICC Right Cephalic 37 cm 2 cm (Active)  Indication for Insertion or Continuance of Line Vasoactive infusions 03/26/2018  2:05 PM  Exposed Catheter (cm) 2 cm 03/26/2018  2:05 PM  Site Assessment Clean;Dry;Intact 03/26/2018  2:05 PM  Lumen #1 Status Flushed;Saline locked;Blood return noted 03/26/2018  2:05 PM  Lumen #2 Status Flushed;Saline locked;Blood return noted 03/26/2018  2:05 PM  Lumen #3 Status Saline locked;Flushed;Blood return noted 03/26/2018  2:05 PM  Dressing Type Transparent 03/26/2018  2:05 PM  Dressing Status Clean;Dry;Intact;Antimicrobial disc in place 03/26/2018  2:05 PM  Dressing Change Due 04/02/18 03/26/2018  2:05 PM       Gordan Payment 03/26/2018, 2:06 PM

## 2018-03-26 NOTE — Progress Notes (Addendum)
Advanced Heart Failure Rounding Note   Subjective:    Admitted last night by CCM with fever, cough and respiratory failure requiring intubation.  Flu swab and UA negative.   Husband reports she was unable to take lasix for several days as she was throwing up. Fluid accumulated quickly.   Remains on vent. Awake communicative. Denies CP. Got 1 dose aztreonam now on IV levaquin. Started on IV lasix 160 bid overnight. Not much urine output  Creatinine 1.34 -> 1.68 Tmax 102.6  Lactate 1.7. WBC 11.4.  PCT not drawn. Trop flat 0.40 -> 0.48   CXR worsening diffuse bilateral airspace disease concerning for pulmonary edema. (Personally reviewed)  I reviewed echo personally EF 25-30% with global HK worse from midventricle to apex. RV moderately down. (Echo 1/19 EF 55-60%)   Objective:   Weight Range:  Vital Signs:   Temp:  [98.3 F (36.8 C)-102.6 F (39.2 C)] 100.9 F (38.3 C) (02/23 0805) Pulse Rate:  [52-118] 79 (02/23 0812) Resp:  [14-40] 19 (02/23 0812) BP: (82-209)/(38-123) 160/80 (02/23 1011) SpO2:  [85 %-100 %] 97 % (02/23 0812) FiO2 (%):  [50 %-80 %] 75 % (02/23 1000) Weight:  [60.4 kg-139.7 kg] 60.4 kg (02/23 0500) Last BM Date: 03/25/18  Weight change: Filed Weights   03/25/18 1546 03/25/18 2315 03/26/18 0500  Weight: (!) 139.7 kg 60.4 kg 60.4 kg    Intake/Output:   Intake/Output Summary (Last 24 hours) at 03/26/2018 1107 Last data filed at 03/26/2018 0600 Gross per 24 hour  Intake 336.51 ml  Output 550 ml  Net -213.49 ml     Physical Exam: General:  Awake on vent NAD HEENT: normal + ETT Neck: supple. JVP hard to see with size. Appears up  . Carotids 2+ bilat; no bruits. No lymphadenopathy or thryomegaly appreciated. Cor: PMI nonpalpable. Regular rate & rhythm. 2/6 AS Lungs: coarse Abdomen: obese soft, nontender, nondistended. No hepatosplenomegaly. No bruits or masses. Good bowel sounds. Extremities: no cyanosis, clubbing, rash, 2-3+ edema + Foley Neuro:  awake on vent following commands   ECG: NSR 93 LBBB (chronic)   Telemetry: Sinus 80-90 Personally reviewed   Labs: Basic Metabolic Panel: Recent Labs  Lab 03/20/18 1220 03/25/18 1630 03/25/18 1645 03/25/18 2148 03/26/18 0522  NA 142 138 139 138 141  K 4.3 4.1 4.1 4.2 4.9  CL 100 101  --   --  101  CO2 31 26  --   --  22  GLUCOSE 106* 166*  --   --  135*  BUN 26* 15  --   --  22  CREATININE 1.73* 1.34*  --   --  1.68*  CALCIUM 9.1 9.1  --   --  8.5*  MG  --  2.0  --   --  2.1  PHOS  --  2.8  --   --  3.6    Liver Function Tests: Recent Labs  Lab 03/25/18 1630  AST 38  ALT 22  ALKPHOS 72  BILITOT 1.5*  PROT 7.3  ALBUMIN 3.9   Recent Labs  Lab 03/25/18 1630  LIPASE 19   No results for input(s): AMMONIA in the last 168 hours.  CBC: Recent Labs  Lab 03/25/18 1630 03/25/18 1645 03/25/18 2148 03/26/18 0522  WBC 9.4  --   --  11.4*  NEUTROABS 8.1*  --   --   --   HGB 13.4 14.3 11.9* 13.1  HCT 43.9 42.0 35.0* 41.0  MCV 101.4*  --   --  98.6  PLT 104*  --   --  86*    Cardiac Enzymes: Recent Labs  Lab 03/25/18 1630 03/25/18 2230 03/26/18 0522  TROPONINI 0.05* 0.40* 0.48*    BNP: BNP (last 3 results) Recent Labs    01/16/18 2150 03/25/18 1630  BNP 701.3* 1,480.1*    ProBNP (last 3 results) No results for input(s): PROBNP in the last 8760 hours.    Other results:  Imaging: Dg Chest Port 1 View  Result Date: 03/25/2018 CLINICAL DATA:  Post intubation EXAM: PORTABLE CHEST 1 VIEW COMPARISON:  03/25/2018 FINDINGS: Changes of CABG. Endotracheal tube is in place with the tip approximately 2.5 cm above the carina. NG tube enters the stomach. Cardiomegaly. Worsening bilateral airspace opacities, likely edema. No visible effusions or acute bony abnormality. IMPRESSION: Cardiomegaly. Worsening bilateral airspace disease, likely edema/CHF. Electronically Signed   By: Rolm Baptise M.D.   On: 03/25/2018 20:59   Dg Chest Port 1 View  Result Date:  03/25/2018 CLINICAL DATA:  Cough, vomiting, wheezing EXAM: PORTABLE CHEST 1 VIEW COMPARISON:  07/07/2017 FINDINGS: Prior CABG. Cardiomegaly with vascular congestion and interstitial opacities, likely edema. No effusions or acute bony abnormality. IMPRESSION: Cardiomegaly, mild CHF. Electronically Signed   By: Rolm Baptise M.D.   On: 03/25/2018 17:33      Medications:     Scheduled Medications: . amiodarone  100 mg Per NG tube Daily  . amLODipine  10 mg Oral Daily  . arformoterol  15 mcg Nebulization BID  . budesonide (PULMICORT) nebulizer solution  0.25 mg Nebulization BID  . buPROPion  75 mg Oral BID  . chlorhexidine gluconate (MEDLINE KIT)  15 mL Mouth Rinse BID  . cholecalciferol  2,000 Units Oral Q breakfast  . cloNIDine  0.2 mg Per NG tube BID  . clopidogrel  75 mg Per NG tube Daily  . DULoxetine  30 mg Oral Daily  . ezetimibe  10 mg Per Tube QHS  . gabapentin  1,200 mg Oral QHS  . gabapentin  600 mg Oral BID  . insulin aspart  2-6 Units Subcutaneous Q4H  . insulin glargine  25 Units Subcutaneous Q2200  . mouth rinse  15 mL Mouth Rinse 10 times per day  . multivitamin  1 tablet Oral Daily  . pantoprazole sodium  40 mg Per Tube BID  . rosuvastatin  40 mg Per NG tube QHS  . vitamin B-12  1,000 mcg Oral Daily     Infusions: . fentaNYL infusion INTRAVENOUS 50 mcg/hr (03/26/18 0820)  . furosemide 200 mg (03/26/18 0819)  . heparin Stopped (03/26/18 0915)  . levofloxacin (LEVAQUIN) IV 750 mg (03/26/18 1025)     PRN Medications:  ALPRAZolam, bisacodyl, docusate, fentaNYL, fentaNYL (SUBLIMAZE) injection, ipratropium-albuterol, nitroGLYCERIN   Assessment:   [name] [name] is a 73 y.o. female with multiple medical problems including a hx of CAD s/p CABG and multiple PCI, HFpEF s/p CardioMEMS, AF, cerebrovascular disease, CKD, DM2, PAD, admitted 2/22 with acute respiratory failure requiring mechanical intubation in the setting of URI with fever to 102 and cough.    Plan/Discussion:    1. Acute hypoxic respiratory failure  - I suspect inciting event was infectious in nature but now main issue appears to be acute systolic HF with EF newly down and pulmonary edema on CXR - Echo 03/26/18 EF 25-30% with global HK worse from midventricle to apex. RV moderately down. (Echo 1/19 EF 55-60%) - question Tako-tsubo vs viral-mediated - Troponin flat at 0.4 doubt ACS. ECG with LBBB (  chronic) - Will place PICC to follow CVP and Co-ox  - Continue IV lasix. Add metolazone or switch to lasix gtt as needed - Stop clonidine. Can use IV hydralazine +/- Entresto as tolerated to control BP - Start spiro 12.5  2. Acute systolic HF - discussion as above. Previously with h/o diastolic HF. S/p Cardiomems  - Echo 03/26/18 EF 25-30% with global HK worse from midventricle to apex. RV moderately down. (Echo 1/19 EF 55-60%) - question Tako-tsubo vs viral-mediated vs HTN - Continue IV diuresis. Other management as above  3. ID - suspect viral URI. Flu negative. - send respiratory panel. checkc PCT - CCM managing vent. Continue levaquin  for now  4. CAD s/p CABG and CTO RCA - Trop flat at 0.4. Doubt ACS  - Last cath 9/19 with severe native CAD and LIMA patent. Sequential SVG-PLOM/OM1 with 80% proximal stenosis, s/p DES. SVG to RCA chronically occluded - continue ASA, plavix and statin   5. PAF - In NSR on po amio. On Eliquis at home. On heparin currently. Can switch back to Eliquis soon   6. Severe HTN - stop clonidine and amlodipine. Use PRN hydralazine and add Entrtesto 24/26 bid  7. Morbid obesity with OHS/OSA  - intolerant CPAP  8. DM2 - per primary team    9. CKD 3 - recent baseline seems to be 1.4-1.9  10. H/o CVA - stable  CRITICAL CARE Performed by: Glori Bickers  Total critical care time: 55 minutes  Critical care time was exclusive of separately billable procedures and treating other patients.  Critical care was necessary to treat or prevent  imminent or life-threatening deterioration.  Critical care was time spent personally by me (independent of midlevel providers or residents) on the following activities: development of treatment plan with patient and/or surrogate as well as nursing, discussions with consultants, evaluation of patient's response to treatment, examination of patient, obtaining history from patient or surrogate, ordering and performing treatments and interventions, ordering and review of laboratory studies, ordering and review of radiographic studies, pulse oximetry and re-evaluation of patient's condition.    Length of Stay: 1   Glori Bickers MD 03/26/2018, 11:07 AM  Advanced Heart Failure Team Pager [phone] (M-F; [place])  Please contact [facility] for night-coverage after hours (4p -7a ) and weekends on [email or web address]

## 2018-03-26 NOTE — Progress Notes (Signed)
ANTICOAGULATION CONSULT NOTE - Follow Up Consult  Pharmacy Consult for Heparin Indication: atrial fibrillation  Allergies  Allergen Reactions  . Amoxicillin Shortness Of Breath and Rash  . Brilinta [Ticagrelor] Shortness Of Breath  . Erythromycin Shortness Of Breath, Other (See Comments) and Hives    Trouble swallowing  . Flagyl [Metronidazole] Palpitations and Shortness Of Breath  . Penicillins Hives, Shortness Of Breath, Rash and Other (See Comments)    Has patient had a PCN reaction causing immediate rash, facial/tongue/throat swelling, SOB or lightheadedness with hypotension: Yes Has patient had a PCN reaction causing severe rash involving mucus membranes or skin necrosis: No Has patient had a PCN reaction that required hospitalization: Yes Has patient had a PCN reaction occurring within the last 10 years: No If all of the above answers are "NO", then may proceed with Cephalosporin use.   . Isosorbide Mononitrate [Isosorbide Nitrate] Other (See Comments)    Joint aches, muscles hurt, difficult to walk   . Jardiance [Empagliflozin] Other (See Comments)    Nausea, joint aches, muscles aches  . Metformin And Related Other (See Comments)    Stomach pain, cold sweats, joint pain, burred vision, dizziness  . Tape Other (See Comments)    Skin pulls off with certain types Plastic tape causes skin to rip if left on for long periods of time  . Erythromycin Base Rash    Patient Measurements: Height: 5' 2.5" (158.8 cm) Weight: 133 lb 2.5 oz (60.4 kg) IBW/kg (Calculated) : 51.25 Heparin Dosing Weight: 88  Vital Signs: Temp: 99.3 F (37.4 C) (02/23 1900) Temp Source: Oral (02/23 1900) BP: 107/46 (02/23 2030) Pulse Rate: 67 (02/23 2030)  Labs: Recent Labs    03/25/18 1630 03/25/18 1645 03/25/18 2148 03/25/18 2230 03/26/18 0522 03/26/18 0908 03/26/18 2000 03/26/18 2100  HGB 13.4 14.3 11.9*  --  13.1  --   --   --   HCT 43.9 42.0 35.0*  --  41.0  --   --   --   PLT 104*   --   --   --  86*  --   --   --   APTT  --   --   --   --   --  104* 165*  --   LABPROT 18.8*  --   --   --   --   --   --   --   INR 1.60  --   --   --   --   --   --   --   HEPARINUNFRC  --   --   --   --  >2.20*  --   --  2.10*  CREATININE 1.34*  --   --   --  1.68*  --   --   --   TROPONINI 0.05*  --   --  0.40* 0.48*  --   --   --     Estimated Creatinine Clearance: 25.2 mL/min (A) (by C-G formula based on SCr of 1.68 mg/dL (H)).  Assessment: 73 year old female to begin heparin for Afib. Per husband, patient had been taking apixaban up until late last week, when she started throwing up her medications. Last filled on 03/14/2018.   Heparin was held given unclear apixaban compliance, per med rec had been stopped earlier this month. No bleeding noted by nursing.   PTT still elevated this evening   Goal of Therapy:  Heparin level 0.3-0.7 units/ml  APTT 66-102s Monitor platelets by anticoagulation  protocol: Yes   Plan:  Hold heparin x 1 hour then decrease to 1050 units / hr Check anti-Xa level/aPTT in 8 hours and daily while on heparin Continue to monitor H&H and platelets  Thank you Anette Guarneri, PharmD [phone] Please check AMION for all Pharmacist numbers by unit 03/26/2018 10:39 PM

## 2018-03-26 NOTE — Plan of Care (Signed)
Patient has been alert and following commands through night.  BP's have improved through night.  Respiratory status has improved minimally.

## 2018-03-26 NOTE — Plan of Care (Signed)
m °

## 2018-03-26 NOTE — Progress Notes (Signed)
[facility] Progress Note Patient Name: [name] [name] DOB: 01/05/1946 [name]: [id number]   Date of Service  03/26/2018  HPI/Events of Note  Nausea - QTc = 0.4   eICU Interventions  Will order: 1. Zofran 4 mg IV Q 6 hours PRN N/V. 2. Monitor QTc interval now and Q 6 hours. Notify MD if QTc interval > 500 milliseconds.      Intervention Category Major Interventions: Other:  Lysle Dingwall 03/26/2018, 7:52 PM

## 2018-03-26 NOTE — Progress Notes (Signed)
ANTICOAGULATION CONSULT NOTE - Follow Up Consult  Pharmacy Consult for Heparin Indication: atrial fibrillation  Allergies  Allergen Reactions  . Amoxicillin Shortness Of Breath and Rash  . Brilinta [Ticagrelor] Shortness Of Breath  . Erythromycin Shortness Of Breath, Other (See Comments) and Hives    Trouble swallowing  . Flagyl [Metronidazole] Palpitations and Shortness Of Breath  . Penicillins Hives, Shortness Of Breath, Rash and Other (See Comments)    Has patient had a PCN reaction causing immediate rash, facial/tongue/throat swelling, SOB or lightheadedness with hypotension: Yes Has patient had a PCN reaction causing severe rash involving mucus membranes or skin necrosis: No Has patient had a PCN reaction that required hospitalization: Yes Has patient had a PCN reaction occurring within the last 10 years: No If all of the above answers are "NO", then may proceed with Cephalosporin use.   . Isosorbide Mononitrate [Isosorbide Nitrate] Other (See Comments)    Joint aches, muscles hurt, difficult to walk   . Jardiance [Empagliflozin] Other (See Comments)    Nausea, joint aches, muscles aches  . Metformin And Related Other (See Comments)    Stomach pain, cold sweats, joint pain, burred vision, dizziness  . Tape Other (See Comments)    Skin pulls off with certain types Plastic tape causes skin to rip if left on for long periods of time  . Erythromycin Base Rash    Patient Measurements: Height: 5' 2.5" (158.8 cm) Weight: 133 lb 2.5 oz (60.4 kg) IBW/kg (Calculated) : 51.25 Heparin Dosing Weight: 88  Vital Signs: Temp: 100.9 F (38.3 C) (02/23 0805) Temp Source: Axillary (02/23 0805) BP: 160/80 (02/23 1011) Pulse Rate: 79 (02/23 0812)  Labs: Recent Labs    03/25/18 1630 03/25/18 1645 03/25/18 2148 03/25/18 2230 03/26/18 0522 03/26/18 0908  HGB 13.4 14.3 11.9*  --  13.1  --   HCT 43.9 42.0 35.0*  --  41.0  --   PLT 104*  --   --   --  86*  --   APTT  --   --   --    --   --  104*  LABPROT 18.8*  --   --   --   --   --   INR 1.60  --   --   --   --   --   HEPARINUNFRC  --   --   --   --  >2.20*  --   CREATININE 1.34*  --   --   --  1.68*  --   TROPONINI 0.05*  --   --  0.40* 0.48*  --     Estimated Creatinine Clearance: 25.2 mL/min (A) (by C-G formula based on SCr of 1.68 mg/dL (H)).  Assessment: 73 year old female to begin heparin for Afib. Per husband, patient had been taking apixaban up until late last week, when she started throwing up her medications. Last filled on 03/14/2018.   Heparin bolused and started at 1300 units/hr. Initial HL returned supratherapeutic (possibly due to interaction with apixaban), aPTT mildly supratherapeutic at 104. Heparin was held given unclear apixaban compliance, per med rec had been stopped earlier this month. No bleeding noted by nursing.     Goal of Therapy:  Heparin level 0.3-0.7 units/ml  APTT 66-102s Monitor platelets by anticoagulation protocol: Yes   Plan:  Resume heparin infusion at 1250 units/hr Check anti-Xa level/aPTT in 8 hours and daily while on heparin Continue to monitor H&H and platelets   Claiborne Billings, PharmD PGY2 Cardiology Pharmacy  Resident Please check AMION for all Pharmacist numbers by unit 03/26/2018 11:18 AM

## 2018-03-26 NOTE — Progress Notes (Signed)
Pharmacy Antibiotic Note  [name] [name] is a 73 y.o. female admitted on 03/25/2018 with chronic hypoxic respiratory failure, possible infection.  Pharmacy has been consulted for vancomycin/cefepime dosing. Tmax 102.6, WBC 11.4, PCT 0.32, LA 1.8>2.2>1.7. She received aztreonam x1 in the ED and was started on levofloxacin. Now adding on vancomycin/cefepime. Patient with reported penicillin allergy, she received keflex in 2013. Patient and husband willing to try cefepime in the hospital due to increased monitoring.    Vancomycin 1000 mg IV Q 48 hrs. Goal AUC 400-550. Expected AUC:  448 SCr used: 1.68   Plan: Vancomycin 1250 mg IV once, then start vancomycin 1000 mg IV q48h. Level as indicated, Goal UAC 400-550. Cefepime 1g IV q24h Follow cultures, clinical pictures, and renal function    Height: 5' 2.5" (158.8 cm) Weight: 133 lb 2.5 oz (60.4 kg) IBW/kg (Calculated) : 51.25  Temp (24hrs), Avg:100.6 F (38.1 C), Min:98.3 F (36.8 C), Max:102.6 F (39.2 C)  Recent Labs  Lab 03/20/18 1220 03/25/18 1630 03/25/18 2126 03/26/18 0003 03/26/18 0522  WBC  --  9.4  --   --  11.4*  CREATININE 1.73* 1.34*  --   --  1.68*  LATICACIDVEN  --  1.8 2.2* 1.7  --     Estimated Creatinine Clearance: 25.2 mL/min (A) (by C-G formula based on SCr of 1.68 mg/dL (H)).    Allergies  Allergen Reactions  . Amoxicillin Shortness Of Breath and Rash  . Brilinta [Ticagrelor] Shortness Of Breath  . Erythromycin Shortness Of Breath, Other (See Comments) and Hives    Trouble swallowing  . Flagyl [Metronidazole] Palpitations and Shortness Of Breath  . Penicillins Hives, Shortness Of Breath, Rash and Other (See Comments)    Has patient had a PCN reaction causing immediate rash, facial/tongue/throat swelling, SOB or lightheadedness with hypotension: Yes Has patient had a PCN reaction causing severe rash involving mucus membranes or skin necrosis: No Has patient had a PCN reaction that required hospitalization:  Yes Has patient had a PCN reaction occurring within the last 10 years: No If all of the above answers are "NO", then may proceed with Cephalosporin use.   . Isosorbide Mononitrate [Isosorbide Nitrate] Other (See Comments)    Joint aches, muscles hurt, difficult to walk   . Jardiance [Empagliflozin] Other (See Comments)    Nausea, joint aches, muscles aches  . Metformin And Related Other (See Comments)    Stomach pain, cold sweats, joint pain, burred vision, dizziness  . Tape Other (See Comments)    Skin pulls off with certain types Plastic tape causes skin to rip if left on for long periods of time  . Erythromycin Base Rash    Antimicrobials this admission: Vancomycin 2/23 >> Cefepime 2/23 >>  Levaquin 2/23 >> Aztreonam x1 2/23  Dose adjustments this admission:   Microbiology results: 2/22 Bcx: NG < 24 hrs  2/23 MRSA negative  2/22 TA:  2/22 Ucx:  Thank you for allowing pharmacy to be a part of this patient's care.  Wyatt Haste 03/26/2018 1:36 PM

## 2018-03-27 ENCOUNTER — Ambulatory Visit (HOSPITAL_COMMUNITY): Payer: Medicare Other

## 2018-03-27 ENCOUNTER — Inpatient Hospital Stay (HOSPITAL_COMMUNITY): Payer: Medicare Other

## 2018-03-27 DIAGNOSIS — E876 Hypokalemia: Secondary | ICD-10-CM

## 2018-03-27 LAB — GLUCOSE, CAPILLARY
Glucose-Capillary: 111 mg/dL — ABNORMAL HIGH (ref 70–99)
Glucose-Capillary: 134 mg/dL — ABNORMAL HIGH (ref 70–99)
Glucose-Capillary: 136 mg/dL — ABNORMAL HIGH (ref 70–99)
Glucose-Capillary: 139 mg/dL — ABNORMAL HIGH (ref 70–99)
Glucose-Capillary: 140 mg/dL — ABNORMAL HIGH (ref 70–99)
Glucose-Capillary: 159 mg/dL — ABNORMAL HIGH (ref 70–99)
Glucose-Capillary: 168 mg/dL — ABNORMAL HIGH (ref 70–99)

## 2018-03-27 LAB — BASIC METABOLIC PANEL
Anion gap: 14 (ref 5–15)
BUN: 23 mg/dL (ref 8–23)
CO2: 35 mmol/L — ABNORMAL HIGH (ref 22–32)
Calcium: 8.2 mg/dL — ABNORMAL LOW (ref 8.9–10.3)
Chloride: 92 mmol/L — ABNORMAL LOW (ref 98–111)
Creatinine, Ser: 1.71 mg/dL — ABNORMAL HIGH (ref 0.44–1.00)
GFR calc Af Amer: 35 mL/min — ABNORMAL LOW (ref >60)
GFR calc non Af Amer: 30 mL/min — ABNORMAL LOW (ref >60)
Glucose, Bld: 120 mg/dL — ABNORMAL HIGH (ref 70–99)
Potassium: 3.3 mmol/L — ABNORMAL LOW (ref 3.5–5.1)
Sodium: 141 mmol/L (ref 135–145)

## 2018-03-27 LAB — APTT: aPTT: 86 seconds — ABNORMAL HIGH (ref 24–36)

## 2018-03-27 LAB — CBC
HCT: 42.3 % (ref 36.0–46.0)
Hemoglobin: 12.8 g/dL (ref 12.0–15.0)
MCH: 30.3 pg (ref 26.0–34.0)
MCHC: 30.3 g/dL (ref 30.0–36.0)
MCV: 100 fL (ref 80.0–100.0)
Platelets: 49 10*3/uL — ABNORMAL LOW (ref 150–400)
RBC: 4.23 MIL/uL (ref 3.87–5.11)
RDW: 13.9 % (ref 11.5–15.5)
WBC: 13.5 10*3/uL — ABNORMAL HIGH (ref 4.0–10.5)
nRBC: 0 % (ref 0.0–0.2)

## 2018-03-27 LAB — POCT I-STAT 7, (LYTES, BLD GAS, ICA,H+H)
Acid-Base Excess: 16 mmol/L — ABNORMAL HIGH (ref 0.0–2.0)
Bicarbonate: 42.7 mmol/L — ABNORMAL HIGH (ref 20.0–28.0)
Calcium, Ion: 1.09 mmol/L — ABNORMAL LOW (ref 1.15–1.40)
HCT: 41 % (ref 36.0–46.0)
Hemoglobin: 13.9 g/dL (ref 12.0–15.0)
O2 Saturation: 93 %
Patient temperature: 97.9
Potassium: 3.1 mmol/L — ABNORMAL LOW (ref 3.5–5.1)
Sodium: 138 mmol/L (ref 135–145)
TCO2: 44 mmol/L — ABNORMAL HIGH (ref 22–32)
pCO2 arterial: 58.4 mmHg — ABNORMAL HIGH (ref 32.0–48.0)
pH, Arterial: 7.471 — ABNORMAL HIGH (ref 7.350–7.450)
pO2, Arterial: 64 mmHg — ABNORMAL LOW (ref 83.0–108.0)

## 2018-03-27 LAB — URINE CULTURE: Culture: 10000 — AB

## 2018-03-27 LAB — COOXEMETRY PANEL
Carboxyhemoglobin: 1.1 % (ref 0.5–1.5)
Methemoglobin: 1.8 % — ABNORMAL HIGH (ref 0.0–1.5)
O2 Saturation: 78.7 %
Total hemoglobin: 13.2 g/dL (ref 12.0–16.0)

## 2018-03-27 LAB — PHOSPHORUS: Phosphorus: 2.9 mg/dL (ref 2.5–4.6)

## 2018-03-27 LAB — HEPARIN LEVEL (UNFRACTIONATED): Heparin Unfractionated: 1.66 IU/mL — ABNORMAL HIGH (ref 0.30–0.70)

## 2018-03-27 LAB — PROCALCITONIN: Procalcitonin: 3.08 ng/mL

## 2018-03-27 LAB — MAGNESIUM: Magnesium: 1.8 mg/dL (ref 1.7–2.4)

## 2018-03-27 MED ORDER — LOSARTAN POTASSIUM 25 MG PO TABS
12.5000 mg | ORAL_TABLET | Freq: Every day | ORAL | Status: DC
  Administered 2018-03-27: 12.5 mg via ORAL
  Filled 2018-03-27: qty 1

## 2018-03-27 MED ORDER — SORBITOL 70 % SOLN
30.0000 mL | Freq: Once | Status: AC
  Administered 2018-03-27: 30 mL
  Filled 2018-03-27: qty 30

## 2018-03-27 MED ORDER — DIPHENHYDRAMINE HCL 50 MG/ML IJ SOLN
25.0000 mg | Freq: Four times a day (QID) | INTRAMUSCULAR | Status: DC | PRN
  Administered 2018-03-27 – 2018-03-30 (×4): 25 mg via INTRAVENOUS
  Filled 2018-03-27 (×4): qty 1

## 2018-03-27 MED ORDER — LEVOFLOXACIN IN D5W 500 MG/100ML IV SOLN
500.0000 mg | INTRAVENOUS | Status: DC
  Administered 2018-03-29 – 2018-03-31 (×2): 500 mg via INTRAVENOUS
  Filled 2018-03-27 (×2): qty 100

## 2018-03-27 MED ORDER — METOLAZONE 5 MG PO TABS
5.0000 mg | ORAL_TABLET | Freq: Every day | ORAL | Status: AC
  Administered 2018-03-27: 5 mg via ORAL
  Filled 2018-03-27: qty 1

## 2018-03-27 MED ORDER — FUROSEMIDE 10 MG/ML IJ SOLN
160.0000 mg | Freq: Once | INTRAVENOUS | Status: AC
  Administered 2018-03-27: 160 mg via INTRAVENOUS
  Filled 2018-03-27: qty 10

## 2018-03-27 MED ORDER — POTASSIUM CHLORIDE 20 MEQ/15ML (10%) PO SOLN
40.0000 meq | Freq: Three times a day (TID) | ORAL | Status: AC
  Administered 2018-03-27 (×3): 40 meq via ORAL
  Filled 2018-03-27 (×3): qty 30

## 2018-03-27 MED ORDER — TORSEMIDE 100 MG PO TABS: 100.0000 mg | ORAL_TABLET | Freq: Two times a day (BID) | ORAL | Status: DC

## 2018-03-27 MED ORDER — SPIRONOLACTONE 12.5 MG HALF TABLET
12.5000 mg | ORAL_TABLET | Freq: Every day | ORAL | Status: DC
  Administered 2018-03-27: 12.5 mg via ORAL
  Filled 2018-03-27 (×2): qty 1

## 2018-03-27 MED ORDER — MAGNESIUM SULFATE 50 % IJ SOLN
3.0000 g | Freq: Once | INTRAVENOUS | Status: AC
  Administered 2018-03-27: 3 g via INTRAVENOUS
  Filled 2018-03-27: qty 6

## 2018-03-27 MED ORDER — TORSEMIDE 100 MG PO TABS
100.0000 mg | ORAL_TABLET | Freq: Two times a day (BID) | ORAL | Status: DC
  Administered 2018-03-27: 100 mg via ORAL
  Filled 2018-03-27: qty 1

## 2018-03-27 MED ORDER — PROMETHAZINE HCL 25 MG/ML IJ SOLN
6.2500 mg | Freq: Four times a day (QID) | INTRAMUSCULAR | Status: DC | PRN
  Administered 2018-03-27 – 2018-04-01 (×3): 6.25 mg via INTRAVENOUS
  Filled 2018-03-27 (×3): qty 1

## 2018-03-27 NOTE — Progress Notes (Addendum)
ANTICOAGULATION CONSULT NOTE - Follow Up Consult  Pharmacy Consult for Heparin Indication: atrial fibrillation  Allergies  Allergen Reactions  . Amoxicillin Shortness Of Breath and Rash  . Brilinta [Ticagrelor] Shortness Of Breath  . Erythromycin Shortness Of Breath, Other (See Comments) and Hives    Trouble swallowing  . Flagyl [Metronidazole] Palpitations and Shortness Of Breath  . Penicillins Hives, Shortness Of Breath, Rash and Other (See Comments)    Has patient had a PCN reaction causing immediate rash, facial/tongue/throat swelling, SOB or lightheadedness with hypotension: Yes Has patient had a PCN reaction causing severe rash involving mucus membranes or skin necrosis: No Has patient had a PCN reaction that required hospitalization: Yes Has patient had a PCN reaction occurring within the last 10 years: No If all of the above answers are "NO", then may proceed with Cephalosporin use.   . Isosorbide Mononitrate [Isosorbide Nitrate] Other (See Comments)    Joint aches, muscles hurt, difficult to walk   . Jardiance [Empagliflozin] Other (See Comments)    Nausea, joint aches, muscles aches  . Metformin And Related Other (See Comments)    Stomach pain, cold sweats, joint pain, burred vision, dizziness  . Tape Other (See Comments)    Skin pulls off with certain types Plastic tape causes skin to rip if left on for long periods of time  . Erythromycin Base Rash    Patient Measurements: Height: 5' 2.5" (158.8 cm) Weight: 133 lb 2.5 oz (60.4 kg) IBW/kg (Calculated) : 51.25 Heparin Dosing Weight: 88  Vital Signs: Temp: 97.9 F (36.6 C) (02/24 0400) Temp Source: Oral (02/24 0400) BP: 134/68 (02/24 0539) Pulse Rate: 82 (02/24 0539)  Labs: Recent Labs    03/25/18 1630  03/25/18 2230 03/26/18 0522 03/26/18 0908 03/26/18 2000 03/26/18 2100 03/27/18 0434 03/27/18 0629 03/27/18 0637  HGB 13.4   < >  --  13.1  --   --   --  12.8  --  13.9  HCT 43.9   < >  --  41.0  --    --   --  42.3  --  41.0  PLT 104*  --   --  86*  --   --   --  49*  --   --   APTT  --   --   --   --  104* 165*  --   --  86*  --   LABPROT 18.8*  --   --   --   --   --   --   --   --   --   INR 1.60  --   --   --   --   --   --   --   --   --   HEPARINUNFRC  --   --   --  >2.20*  --   --  2.10*  --   --   --   CREATININE 1.34*  --   --  1.68*  --   --   --  1.71*  --   --   TROPONINI 0.05*  --  0.40* 0.48*  --   --   --   --   --   --    < > = values in this interval not displayed.    Estimated Creatinine Clearance: 24.8 mL/min (A) (by C-G formula based on SCr of 1.71 mg/dL (H)).  Assessment: 73 year old female to begin heparin for Afib. Per husband, patient had been taking  apixaban up until late last week, when she started throwing up her medications. Last filled on 03/14/2018.   This morning aPTT came back to 86, on 1050 units/hr. Hgb 13.9, plt 49 from 86 yesterday. No s/sx of bleeding. No infusion issues.   Goal of Therapy:  Heparin level 0.3-0.7 units/ml  APTT 66-102s Monitor platelets by anticoagulation protocol: Yes   Plan:  Given platelet drop and in NSR, discussed with team and will discontinue heparin infusion Plan to use SCDs until determine if any future procedures If none planned, will restart apixaban Monitor CBC   Thank you Antonietta Jewel, PharmD, BCCCP Clinical Pharmacist  Pager: [phone] Phone: [phone] Please check AMION for all Pharmacist numbers by unit 03/27/2018 7:32 AM

## 2018-03-27 NOTE — Progress Notes (Addendum)
Advanced Heart Failure Rounding Note   Subjective:    Admitted last night by CCM with fever, cough and respiratory failure requiring intubation.  Flu swab and UA negative.   Husband reports she was unable to take lasix for several days as she was throwing up. Fluid accumulated quickly.   Remains on vent. Awake. On 75% FiO2. On IV vanc, cefepime, and levaquin. PCT 0.32 -> 3.08. Afebrile. WBC 11.4 ->13.4. Blood cx NGTD. Urine cx with E Coli.  PICC line placed. Coox 78%. CVP 6-7. Remains in 160 mg IV lasix BID + metolazone with great UOP. Negative 7.6 L.   Started on Entresto and spiro yesterday. Creatinine 1.71. SBP 100-130s.  Platelets 86 -> 49. On heparin drip. No obvious bleeding.  Echo EF 25-30% with global HK worse from mid-ventricle to apex. RV moderately down. (Echo 1/19 EF 55-60%)  Objective:   Weight Range:  Vital Signs:   Temp:  [97.9 F (36.6 C)-102.4 F (39.1 C)] 97.9 F (36.6 C) (02/24 0400) Pulse Rate:  [67-91] 82 (02/24 0539) Resp:  [14-23] 23 (02/24 0539) BP: (63-170)/(35-107) 134/68 (02/24 0539) SpO2:  [87 %-99 %] 96 % (02/24 0539) FiO2 (%):  [42 %-75 %] 75 % (02/24 0539) Weight:  [60.4 kg] 60.4 kg (02/24 0500) Last BM Date: 03/25/18  Weight change: Filed Weights   03/25/18 2315 03/26/18 0500 03/27/18 0500  Weight: 60.4 kg 60.4 kg 60.4 kg    Intake/Output:   Intake/Output Summary (Last 24 hours) at 03/27/2018 0731 Last data filed at 03/27/2018 0636 Gross per 24 hour  Intake 1095.39 ml  Output 8675 ml  Net -7579.61 ml     Physical Exam: General: Intubated. Awake on Vent.  HEENT: + ETT Neck: Supple. JVP difficult to assess. Carotids 2+ bilat; no bruits. No thyromegaly or nodule noted. Cor: PMI nondisplaced. RRR, 2/6 AS.  Lungs: Diminished basilar sounds Abdomen: Soft, non-tender, non-distended, no HSM. No bruits or masses. +BS  Extremities: No cyanosis, clubbing, or rash. BLE 1+ edema.  Neuro: Intubated/sedated   Telemetry: NSR 80s with  LBBB. Personally reviewed.    Labs: Basic Metabolic Panel: Recent Labs  Lab 03/20/18 1220 03/25/18 1630 03/25/18 1645 03/25/18 2148 03/26/18 0522 03/27/18 0434 03/27/18 0637  NA 142 138 139 138 141 141 138  K 4.3 4.1 4.1 4.2 4.9 3.3* 3.1*  CL 100 101  --   --  101 92*  --   CO2 31 26  --   --  22 35*  --   GLUCOSE 106* 166*  --   --  135* 120*  --   BUN 26* 15  --   --  22 23  --   CREATININE 1.73* 1.34*  --   --  1.68* 1.71*  --   CALCIUM 9.1 9.1  --   --  8.5* 8.2*  --   MG  --  2.0  --   --  2.1 1.8  --   PHOS  --  2.8  --   --  3.6 2.9  --     Liver Function Tests: Recent Labs  Lab 03/25/18 1630  AST 38  ALT 22  ALKPHOS 72  BILITOT 1.5*  PROT 7.3  ALBUMIN 3.9   Recent Labs  Lab 03/25/18 1630  LIPASE 19   No results for input(s): AMMONIA in the last 168 hours.  CBC: Recent Labs  Lab 03/25/18 1630 03/25/18 1645 03/25/18 2148 03/26/18 0522 03/27/18 0434 03/27/18 0637  WBC 9.4  --   --  11.4* 13.5*  --   NEUTROABS 8.1*  --   --   --   --   --   HGB 13.4 14.3 11.9* 13.1 12.8 13.9  HCT 43.9 42.0 35.0* 41.0 42.3 41.0  MCV 101.4*  --   --  98.6 100.0  --   PLT 104*  --   --  86* 49*  --     Cardiac Enzymes: Recent Labs  Lab 03/25/18 1630 03/25/18 2230 03/26/18 0522  TROPONINI 0.05* 0.40* 0.48*    BNP: BNP (last 3 results) Recent Labs    01/16/18 2150 03/25/18 1630  BNP 701.3* 1,480.1*    ProBNP (last 3 results) No results for input(s): PROBNP in the last 8760 hours.    Other results:  Imaging: Dg Chest Port 1 View  Result Date: 03/25/2018 CLINICAL DATA:  Post intubation EXAM: PORTABLE CHEST 1 VIEW COMPARISON:  03/25/2018 FINDINGS: Changes of CABG. Endotracheal tube is in place with the tip approximately 2.5 cm above the carina. NG tube enters the stomach. Cardiomegaly. Worsening bilateral airspace opacities, likely edema. No visible effusions or acute bony abnormality. IMPRESSION: Cardiomegaly. Worsening bilateral airspace disease,  likely edema/CHF. Electronically Signed   By: Rolm Baptise M.D.   On: 03/25/2018 20:59   Dg Chest Port 1 View  Result Date: 03/25/2018 CLINICAL DATA:  Cough, vomiting, wheezing EXAM: PORTABLE CHEST 1 VIEW COMPARISON:  07/07/2017 FINDINGS: Prior CABG. Cardiomegaly with vascular congestion and interstitial opacities, likely edema. No effusions or acute bony abnormality. IMPRESSION: Cardiomegaly, mild CHF. Electronically Signed   By: Rolm Baptise M.D.   On: 03/25/2018 17:33   [place] Ekg Site Rite  Result Date: 03/26/2018 If [facility] image not attached, placement could not be confirmed due to current cardiac rhythm.    Medications:     Scheduled Medications: . amiodarone  100 mg Per NG tube Daily  . arformoterol  15 mcg Nebulization BID  . budesonide (PULMICORT) nebulizer solution  0.25 mg Nebulization BID  . buPROPion  75 mg Oral BID  . chlorhexidine gluconate (MEDLINE KIT)  15 mL Mouth Rinse BID  . Chlorhexidine Gluconate Cloth  6 each Topical Daily  . cholecalciferol  2,000 Units Oral Q breakfast  . clopidogrel  75 mg Per NG tube Daily  . DULoxetine  30 mg Oral Daily  . ezetimibe  10 mg Per Tube QHS  . gabapentin  1,200 mg Oral QHS  . gabapentin  600 mg Oral BID  . insulin aspart  2-6 Units Subcutaneous Q4H  . insulin glargine  25 Units Subcutaneous Q2200  . mouth rinse  15 mL Mouth Rinse 10 times per day  . multivitamin  1 tablet Oral Daily  . pantoprazole sodium  40 mg Per Tube BID  . rosuvastatin  40 mg Per NG tube QHS  . sodium chloride flush  10-40 mL Intracatheter Q12H  . vitamin B-12  1,000 mcg Oral Daily    Infusions: . sodium chloride 10 mL/hr at 03/26/18 1900  . ceFEPime (MAXIPIME) IV Stopped (03/26/18 1627)  . fentaNYL infusion INTRAVENOUS 100 mcg/hr (03/27/18 0330)  . furosemide 160 mg (03/26/18 2021)  . heparin 1,050 Units/hr (03/26/18 2348)  . levofloxacin (LEVAQUIN) IV Stopped (03/26/18 1155)  . norepinephrine (LEVOPHED) Adult infusion Stopped (03/26/18 2031)    . [START ON 03/28/2018] vancomycin      PRN Medications: sodium chloride, acetaminophen (TYLENOL) oral liquid 160 mg/5 mL, ALPRAZolam, bisacodyl, docusate, fentaNYL, fentaNYL (SUBLIMAZE) injection, hydrALAZINE, ipratropium-albuterol, nitroGLYCERIN, ondansetron (ZOFRAN) IV, sodium chloride flush  Assessment:   [name] [name] is a 73 y.o. female with multiple medical problems including a hx of CAD s/p CABG and multiple PCI, HFpEF s/p CardioMEMS, AF, cerebrovascular disease, CKD, DM2, PAD, admitted 2/22 with acute respiratory failure requiring mechanical intubation in the setting of URI with fever to 102 and cough.   Plan/Discussion:    1. Acute hypoxic respiratory failure  - I suspect inciting event was infectious in nature but now main issue appears to be acute systolic HF with EF newly down and pulmonary edema on CXR - Echo 03/26/18 EF 25-30% with global HK worse from midventricle to apex. RV moderately down. (Echo 1/19 EF 55-60%) - question Tako-tsubo vs viral-mediated - Troponin flat at 0.4 doubt ACS. ECG with LBBB (chronic) - CVP now 6-7 - Still on 75% FiO2.   2. Acute systolic HF - discussion as above. Previously with h/o diastolic HF. S/p Cardiomems  - Echo 03/26/18 EF 25-30% with global HK worse from midventricle to apex. RV moderately down. (Echo 1/19 EF 55-60%) - question Tako-tsubo vs viral-mediated vs HTN - Volume much improved. CVP 6-7. Coox 78%.  - Switch back to home diuretics. On torsemide 100 mg BID - Start losartan 12.5 mg daily. BP dropped with Entresto.  - Restart spiro 12.5 mg daily  3. ID - suspect viral URI. Flu negative. - send respiratory panel. PCT 0.32 -> 3.08 - CCM managing vent. Continue levaquin, vanc, and cefepime.   4. CAD s/p CABG and CTO RCA - Trop flat at 0.4. Doubt ACS  - Last cath 9/19 with severe native CAD and LIMA patent. Sequential SVG-PLOM/OM1 with 80% proximal stenosis, s/p DES. SVG to RCA chronically occluded - continue ASA, plavix and  statin. No change.   5. PAF - Remains in NSR on po amio. On Eliquis at home. On heparin currently. - Platelets trending down 86 -> 49. Low probability for HIT per pharmD. Can likely switch back to Eliquis soon. Hold heparin for now. Add SCDs.   6. Severe HTN - Continue Entrtesto 24/26 bid. Improved SBP 100-130s  7. Morbid obesity with OHS/OSA  - intolerant CPAP. Remains intubated.   8. DM2 - per primary team. No change.     9. CKD 3 - recent baseline seems to be 1.4-1.9. Creatinine 1.71 this am.   10. H/o CVA - stable. No change.   11. Hypokalemia/Hypomagnesium - K 3.1, mag 1.8. Supp.   12. Thrombocytopenia - See above. Holding heparin for now. Add SCDs  13. Constipation - Add sorbitol   Length of Stay: [place]aden NP 03/27/2018, 7:31 AM  Advanced Heart Failure Team Pager [phone] (M-F; [place])  Please contact [facility] for night-coverage after hours (4p -7a ) and weekends on [email or web address]  Patient seen with NP, agree with the above note.   She is intubated this morning but awake.  CXR still with bilateral airspace disease.  She diuresed very well yesterday, weight down.  Afebrile.  Co-ox 79%, CVP 11-12 on my measure this morning.  She is on vancomycin/cefepime/levofloxacin.  Plts down to 49K. PCT 3.08.   On exam, awake/alert/follows commands.  JVP 10 cm.  Coarse BS.  1+ edema to knees bilaterally.   1. Acute hypoxemic respiratory failure: Suspect combination of pulmonary edema and likely PNA.  Still with FiO2 0.75. CXR with diffuse bilateral airspace disease and PCT 3.08. Diuresed well yesterday, CVP 11-12 now.  - Will give one more dose Lasix 160 mg IV x 1 then  resume torsemide this evening.  - Continue broad spectrum abx.   - Wean vent as able.  2. ID: Suspect PNA.  PCT 3.08 today.  Initial fever to 102.6, now afebrile.  WBCs 13.5.  - Continue broad spectrum abx per CCM.  3. Acute systolic CHF: Echo with EF 25-30%, severe hypokinesis-akinesis of the  mid-apical LV myocardium.  Prior echo with normal EF.  ?Takotsubo event with stress of acute illness.  However, she has known extensive coronary disease as well.  Good Co-ox at 79%, CVP 11-12.  - Will give Lasix 160 mg IV x 1 this morning then start home torsemide this evening.  - Add spironolactone 12.5 daily.  - Add losartan 12.5 mg daily.  - If creatinine remains stable, will likely do eventual cath.  4. CKD: stage 3, baseline 1.4-1.8.  This morning, creatinine 1.7, fairly stable with diuresis.  Follow closely.  5. CAD: Last cath 9/19 with severe native CAD and LIMA patent. Sequential SVG-PLOM/OM1 with 80% proximal stenosis, s/p DES. SVG to RCA chronically occluded. No chest pain.  Mild elevation in troponin with no trend, likely demand ischemia. Echo with newly low EF, may be Takotsubo.  - Continue Plavix, statin.  - Possible eventual cath with fall in EF.  6. Atrial fibrillation: Paroxysmal.  She is in NSR. Eliquis on hold for possible procedure and heparin started.  Platelets down to 49K => see below, holding heparin gtt.  7. Thrombocytopenia: ?Sepsis related, less likely from HIT (score low).  Down to 49K today.  - Hold heparin for now.  - SCDs.   CRITICAL CARE Performed by: Loralie Champagne  Total critical care time: 40 minutes  Critical care time was exclusive of separately billable procedures and treating other patients.  Critical care was necessary to treat or prevent imminent or life-threatening deterioration.  Critical care was time spent personally by me on the following activities: development of treatment plan with patient and/or surrogate as well as nursing, discussions with consultants, evaluation of patient's response to treatment, examination of patient, obtaining history from patient or surrogate, ordering and performing treatments and interventions, ordering and review of laboratory studies, ordering and review of radiographic studies, pulse oximetry and re-evaluation of  patient's condition.  Loralie Champagne 03/27/2018 8:20 AM

## 2018-03-27 NOTE — Progress Notes (Signed)
Initial Nutrition Assessment  DOCUMENTATION CODES:   Morbid obesity  INTERVENTION:   If unable to extubate in 24-48 hrs:   Tube feeding:  Vital High Protein @ 40 ml/hr 30 ml Prostat TID  Provides: 1260 kcals, 129 grams protein, 804 ml free water. Meets 100% of needs.   NUTRITION DIAGNOSIS:   Inadequate oral intake related to acute illness as evidenced by NPO status.   GOAL:   Patient will meet greater than or equal to 90% of their needs  MONITOR:   Diet advancement, Vent status, Skin, TF tolerance, Weight trends, Labs, I & O's  REASON FOR ASSESSMENT:   Ventilator    ASSESSMENT:   Patient with PMH significant for OSA, on home o2, CAD s/p CABG and multiple PCI, CHF s/p cardioMEMS, cerebrovascular disease, HTN, CKD, DM, and PAD. Presents this admission with possible CAP, hypertensive urgency, and acute exacerbation of CHF.    Pt discussed with RN, possibility of extubation after chest xray is resulted today. Will leave recommendation if pt requires intubation >24-48 hours.   No family at bedside to provide history. Per CCM, pt had vomiting for days PTA. Weight noted to fluctuate throughout the year. Recent weight reading of 60.4 kg today likely error as pt weighed 139.7 kg 2/22. Will utilize 139.7 kg to estimate needs.   Patient is currently intubated on ventilator support MV: 6.8 L/min Temp (24hrs), Avg:99.7 F (37.6 C), Min:97.9 F (36.6 C), Max:101.4 F (38.6 C) BP: 131/62 MAP: 74  I/O: -10,190 ml since admit UOP: 8275 ml x 24 hrs- continue to diurese NGT: 400 ml x 24 hrs   Medications reviewed and include: Vit D, SS novolog, Lantus, prosight, aldactone, demadex, Vit B12 Labs reviewed: K 3.3 (L) calcium ionized 1.09 (L)  NUTRITION - FOCUSED PHYSICAL EXAM:    Most Recent Value  Orbital Region  No depletion  Upper Arm Region  No depletion  Thoracic and Lumbar Region  Unable to assess  Buccal Region  Unable to assess  Temple Region  No depletion  Clavicle  Bone Region  No depletion  Clavicle and Acromion Bone Region  No depletion  Scapular Bone Region  Unable to assess  Dorsal Hand  No depletion  Patellar Region  No depletion  Anterior Thigh Region  No depletion  Posterior Calf Region  No depletion  Edema (RD Assessment)  Moderate  Hair  Reviewed  Eyes  Unable to assess  Mouth  Unable to assess  Skin  Reviewed  Nails  Reviewed     Diet Order:   Diet Order            Diet NPO time specified  Diet effective now              EDUCATION NEEDS:   Not appropriate for education at this time  Skin:  Skin Assessment: Skin Integrity Issues: Skin Integrity Issues:: Other (Comment) Other: MASD- abdomen perineum  Last BM:  2/22  Height:   Ht Readings from Last 1 Encounters:  03/25/18 5' 2.5" (1.588 m)    Weight:   Wt Readings from Last 1 Encounters:  03/27/18 60.4 kg    Ideal Body Weight:  52.3 kg  BMI:  Body mass index is 23.97 kg/m.  Estimated Nutritional Needs:   Kcal:  1151-1307 kcal  Protein:  105-131 grams  Fluid:  >/= 1.3 L/day    Mariana Single RD, LDN Clinical Nutrition Pager # - [phone]

## 2018-03-27 NOTE — Progress Notes (Addendum)
NAME:  ILO [name], [name]:  [id number], DOB:  November 16, 1945, LOS: 2 ADMISSION DATE:  03/25/2018, CONSULTATION DATE:  03/25/2018 REFERRING MD:  ED physician, CHIEF COMPLAINT:  Acute hypoxic respiratory failure   Brief History   73 year old woman with history of asthma, OSA not on CPAP, chronic hypoxic respiratory failure on 2L at night, CAD s/p CABG and multiple PCI, HFpEF s/p CardioMEMS, AF on Eliquis, cerebrovascular disease, HTN, CKD, DM2, PAD presenting with cough found in the ED to have fever, hypertensive urgency, and acute on chronic hypoxic respiratory failure. She was intubated in the ED and given IV Lasix, aztreonam and NTG gtt.  History of present illness   73 year old woman with history of asthma, OSA not on CPAP, chronic hypoxic respiratory failure on 2L at night, CAD s/p CABG and multiple PCI, HFpEF s/p CardioMEMS, AF on Eliquis, cerebrovascular disease, HTN, CKD, DM2, PAD presenting with cough. History obtained from daughter, husband, and EMR due to intubation and sedation. She has been feeling poorly for the past week with cough productive of sputum. She has been having intermittent emesis and post tussive emesis. She has not been able to take her medications because of her emesis. She had a fall yesterday because of weakness. She was noted to be hypoxic to the 70s this afternoon when her daughter checked.   She lives at home with her husband. She is only able to walk a few feet before she gets dyspneic. She just recently got a rollator to help with mobility. Her husband is her HCPOA.  In the ED, pt febrile to 102.6 with SBP 170-209, HR 70s, O2 in the mid 80s. ECG showed sinus rhythm with LBBB-like IVCD. VBG 7.38/50. Labs included Cr 1.34 (at/improved from baseline), BNP 1480 (last 701 in 01/2018), troponin 0.05. Lactate 1.8. WBC 9.4. Flu panel negative. CXR showed cardiomegaly and bilateral infiltrates. She was intubated in the ED and started on IV aztreonam. IV furosemide was given, and  NTG infusion was started for BP control.   Past Medical History  asthma, OSA not on CPAP, chronic hypoxic respiratory failure on 2L at night, CAD s/p CABG and multiple PCI, HFpEF s/p CardioMEMS, AF on Eliquis, cerebrovascular disease, HTN, CKD, DM2, PAD  Significant Hospital Events   2/22 > admit, intubated  Consults:  Cardiology 2/22  Procedures:  Intubated 2/22>>>   Significant Diagnostic Tests:  CXR 2/22 > Cardiomegaly. Worsening bilateral airspace disease  Micro Data:  BCx x 2 2/22 >> no growth 24 hours  Resp cx 2/22 >>  UCx 2/22 >> e coli  Influenza PCR 2/22 >> neg  Antimicrobials:  Aztreonam 2/22 Levaquin 2/22 >> Vancomycin 2/23>>  Cefepime 2/23>>  Interim history/subjective:  Awake, alert, interactive on vent.  Increased nausea this morning   Objective   Blood pressure 128/61, pulse 85, temperature 97.9 F (36.6 C), temperature source Oral, resp. rate (!) 25, height 5' 2.5" (1.588 m), weight 60.4 kg, SpO2 96 %. CVP:  [5 mmHg-13 mmHg] 13 mmHg  Vent Mode: PRVC FiO2 (%):  [42 %-75 %] 75 % Set Rate:  [15 bmp] 15 bmp Vt Set:  [410 mL] 410 mL PEEP:  [5 cmH20] 5 cmH20 Plateau Pressure:  [15 cmH20-28 cmH20] 15 cmH20   Intake/Output Summary (Last 24 hours) at 03/27/2018 0804 Last data filed at 03/27/2018 0700 Gross per 24 hour  Intake 1208.02 ml  Output 8675 ml  Net -7466.98 ml   Filed Weights   03/25/18 2315 03/26/18 0500 03/27/18 0500  Weight: 60.4 kg 60.4  kg 60.4 kg    Examination: General: Obese adult female, intubated, laying in bed, NAD  HENT: NCAT, ETT secure, PERRL, mmm  Lungs: Bilateral crackles  Cardiovascular: RRR no r/g/m, 2+ radial and 2+ pedal pulses  Abdomen: Soft, ndnt, normoactive x4  Extremities: BLE edema, non-pitting. Symmetrical bulk and tone  Neuro: Awake, alert, interactive. Following commands GU: foley in place   Assessment & Plan:  73 year old woman with history of asthma, OSA not on CPAP, chronic hypoxic respiratory failure on  2L at night, CAD s/p CABG and multiple PCI, HFpEF s/p CardioMEMS, AF on Eliquis, cerebrovascular disease, HTN, CKD, DM2, PAD presenting with cough found in the ED to have fever, hypertensive urgency, and acute on chronic hypoxic respiratory failure.   Acute on chronic hypoxic respiratory failure,  acute on chronic HFpEF: BNP increased to 1480 from 701 2 months ago. -2/24 AM CXR with increased pulm edema pattern  P -Cardiology following, appreciate recs -Will receive Lasix 160mg  this morning  -Per cardiology, start home diuretics-- Torsemide 100mg  BID  -Per cardiology, losartan 12.5 mg qD, Spiro 12.5 mg qD  - Strict in/out, daily weights -CVP qshift -AM CXR  -Continue vent support, would like to be optimally diuresed  -Wean FiO2/PEEP for SpO2 > 92%  -AM ABG   Possible CAP, asthma: Fever to 102.6 in ED.Influenza PCR neg.  -2/24 PCT 3.08, mild leukocytosis 13.5 P - Continue home inhaler as Brovana and Pulmicort nebs BID once off the vent - continue levaquin, vanc cefepime  -Follow up resp culture, blood cultures  E.coli UTI -per UCx 2/22 P Continue cefepime   HTN -resolved Hypertensive urgency, lactic acidosis: BP up to 209/123 (MAP 152) in ED. Dropped as low as 84/45 (MAP 58) after patient was intubated and sedated. Off NTG gtt now. -Trop elevated to 0.48, likely demand ischemia P -Cardiology following appreciate recs  -Per cardiology,losartan 12.5mg  qD   Electrolyte abnormalities  -hypokalemia, likely related to aggressive diuresis P Monitor BMP, replete PRN   CAD s/p CABG and multiple PCI: Continue home plavix, Zetia, statin, Ranexa  AF on Eliquis: continue home amio. Hold home Eliquis and continue heparin gtt.  CKD3: Cr below baseline of 1.7.  DM2:  - Continue home gabapentin and renally adjusted dose - Continue home Lantus at reduced dose, SSI with monitoring  Nausea -per family, patient responds better to home phenergan with benadryl than zofran for  nausea P -order PRN phenergan, PRN benadryl   Best practice:  Diet: NPO Pain/Anxiety/Delirium protocol (if indicated): fent gtt/PRN  VAP protocol (if indicated): ordered DVT prophylaxis: SCDs GI prophylaxis: PPI Glucose control: SSI Mobility: as tolerated  Code Status: FULL Family Communication: updated pt, daughter at bedside  Disposition: ICU  Labs   CBC: Recent Labs  Lab 03/25/18 1630 03/25/18 1645 03/25/18 2148 03/26/18 0522 03/27/18 0434 03/27/18 0637  WBC 9.4  --   --  11.4* 13.5*  --   NEUTROABS 8.1*  --   --   --   --   --   HGB 13.4 14.3 11.9* 13.1 12.8 13.9  HCT 43.9 42.0 35.0* 41.0 42.3 41.0  MCV 101.4*  --   --  98.6 100.0  --   PLT 104*  --   --  86* 49*  --     Basic Metabolic Panel: Recent Labs  Lab 03/20/18 1220 03/25/18 1630 03/25/18 1645 03/25/18 2148 03/26/18 0522 03/27/18 0434 03/27/18 0637  NA 142 138 139 138 141 141 138  K 4.3 4.1 4.1 4.2 4.9  3.3* 3.1*  CL 100 101  --   --  101 92*  --   CO2 31 26  --   --  22 35*  --   GLUCOSE 106* 166*  --   --  135* 120*  --   BUN 26* 15  --   --  22 23  --   CREATININE 1.73* 1.34*  --   --  1.68* 1.71*  --   CALCIUM 9.1 9.1  --   --  8.5* 8.2*  --   MG  --  2.0  --   --  2.1 1.8  --   PHOS  --  2.8  --   --  3.6 2.9  --    GFR: Estimated Creatinine Clearance: 24.8 mL/min (A) (by C-G formula based on SCr of 1.71 mg/dL (H)). Recent Labs  Lab 03/25/18 1630 03/25/18 2126 03/25/18 2230 03/26/18 0003 03/26/18 0522 03/27/18 0434  PROCALCITON  --   --  0.32  --   --  3.08  WBC 9.4  --   --   --  11.4* 13.5*  LATICACIDVEN 1.8 2.2*  --  1.7  --   --     Liver Function Tests: Recent Labs  Lab 03/25/18 1630  AST 38  ALT 22  ALKPHOS 72  BILITOT 1.5*  PROT 7.3  ALBUMIN 3.9   Recent Labs  Lab 03/25/18 1630  LIPASE 19   ABG    Component Value Date/Time   PHART 7.471 (H) 03/27/2018 0637   PCO2ART 58.4 (H) 03/27/2018 0637   PO2ART 64.0 (L) 03/27/2018 0637   HCO3 42.7 (H) 03/27/2018  0637   TCO2 44 (H) 03/27/2018 0637   ACIDBASEDEF 2.0 01/28/2010 1651   O2SAT 93.0 03/27/2018 0637     Coagulation Profile: Recent Labs  Lab 03/25/18 1630  INR 1.60    Cardiac Enzymes: Recent Labs  Lab 03/25/18 1630 03/25/18 2230 03/26/18 0522  TROPONINI 0.05* 0.40* 0.48*    HbA1C: Hgb A1c MFr Bld  Date/Time Value Ref Range Status  10/21/2017 05:12 AM 8.0 (H) 4.8 - 5.6 % Final    Comment:    (NOTE) Pre diabetes:          5.7%-6.4% Diabetes:              >6.4% Glycemic control for   <7.0% adults with diabetes   07/18/2013 08:16 PM 7.2 (H) <5.7 % Final    Comment:    (NOTE)                                                                       According to the ADA Clinical Practice Recommendations for 2011, when HbA1c is used as a screening test:  >=6.5%   Diagnostic of Diabetes Mellitus           (if abnormal result is confirmed) 5.7-6.4%   Increased risk of developing Diabetes Mellitus References:Diagnosis and Classification of Diabetes Mellitus,Diabetes JSEG,3151,76(HYWVP 1):S62-S69 and Standards of Medical Care in         Diabetes - 2011,Diabetes XTGG,2694,85 (Suppl 1):S11-S61.    Critical Care Time 40 minutes   Eliseo Gum MSN, AGACNP-BC [facility] 03/27/2018, 8:04 AM  Attending Note:  73 year old  female with CHF who presents to PCCM with respiratory failure due to acute pulmonary edema.  No events overnight, continues to require high FiO2.  On exam, diffuse crackles noted.  I reviewed CXR myself, ETT is in a good position with increased pulmonary edema noted.  Discussed with PCCM-NP.  Will decrease FiO2 to 60% and increase PEEP to 10.  Continue diureses per cards.  Will add 5 mg of PO zaroxolyn x1.  CXR in AM.  Hold weaning for now.  PCCM will continue to manage.  The patient is critically ill with multiple organ systems failure and requires high complexity decision making for assessment and support, frequent evaluation and  titration of therapies, application of advanced monitoring technologies and extensive interpretation of multiple databases.   Critical Care Time devoted to patient care services described in this note is  33  Minutes. This time reflects time of care of this signee Dr Jennet Maduro. This critical care time does not reflect procedure time, or teaching time or supervisory time of PA/NP/Med student/Med Resident etc but could involve care discussion time.  Rush Farmer, M.D. [facility]. Pager: [phone]. After hours pager: [phone].

## 2018-03-28 ENCOUNTER — Inpatient Hospital Stay (HOSPITAL_COMMUNITY): Payer: Medicare Other

## 2018-03-28 DIAGNOSIS — J9620 Acute and chronic respiratory failure, unspecified whether with hypoxia or hypercapnia: Secondary | ICD-10-CM

## 2018-03-28 DIAGNOSIS — D696 Thrombocytopenia, unspecified: Secondary | ICD-10-CM | POA: Diagnosis not present

## 2018-03-28 DIAGNOSIS — J189 Pneumonia, unspecified organism: Secondary | ICD-10-CM | POA: Diagnosis present

## 2018-03-28 DIAGNOSIS — I5043 Acute on chronic combined systolic (congestive) and diastolic (congestive) heart failure: Secondary | ICD-10-CM

## 2018-03-28 LAB — CBC
HCT: 41.1 % (ref 36.0–46.0)
HCT: 40 % (ref 36.0–46.0)
HCT: 40.8 % (ref 36.0–46.0)
Hemoglobin: 12.5 g/dL (ref 12.0–15.0)
Hemoglobin: 12.6 g/dL (ref 12.0–15.0)
Hemoglobin: 12.3 g/dL (ref 12.0–15.0)
MCH: 30.3 pg (ref 26.0–34.0)
MCH: 31.3 pg (ref 26.0–34.0)
MCH: 30.3 pg (ref 26.0–34.0)
MCHC: 30.4 g/dL (ref 30.0–36.0)
MCHC: 31.5 g/dL (ref 30.0–36.0)
MCHC: 30.1 g/dL (ref 30.0–36.0)
MCV: 99.5 fL (ref 80.0–100.0)
MCV: 99.5 fL (ref 80.0–100.0)
MCV: 100.5 fL — ABNORMAL HIGH (ref 80.0–100.0)
Platelets: <5 10*3/uL — CL (ref 150–400)
Platelets: <5 10*3/uL — CL (ref 150–400)
Platelets: 23 10*3/uL — CL (ref 150–400)
RBC: 4.13 MIL/uL (ref 3.87–5.11)
RBC: 4.02 MIL/uL (ref 3.87–5.11)
RBC: 4.06 MIL/uL (ref 3.87–5.11)
RDW: 13.8 % (ref 11.5–15.5)
RDW: 13.9 % (ref 11.5–15.5)
RDW: 13.9 % (ref 11.5–15.5)
WBC: 12.6 10*3/uL — ABNORMAL HIGH (ref 4.0–10.5)
WBC: 13.3 10*3/uL — ABNORMAL HIGH (ref 4.0–10.5)
WBC: 16 10*3/uL — ABNORMAL HIGH (ref 4.0–10.5)
nRBC: 0 % (ref 0.0–0.2)
nRBC: 0 % (ref 0.0–0.2)
nRBC: 0 % (ref 0.0–0.2)

## 2018-03-28 LAB — PROCALCITONIN: Procalcitonin: 2.39 ng/mL

## 2018-03-28 LAB — POCT I-STAT 7, (LYTES, BLD GAS, ICA,H+H)
Acid-Base Excess: 22 mmol/L — ABNORMAL HIGH (ref 0.0–2.0)
Bicarbonate: 47.9 mmol/L — ABNORMAL HIGH (ref 20.0–28.0)
Calcium, Ion: 1.05 mmol/L — ABNORMAL LOW (ref 1.15–1.40)
HCT: 38 % (ref 36.0–46.0)
Hemoglobin: 12.9 g/dL (ref 12.0–15.0)
O2 Saturation: 94 %
Patient temperature: 98.6
Potassium: 3.2 mmol/L — ABNORMAL LOW (ref 3.5–5.1)
Sodium: 139 mmol/L (ref 135–145)
TCO2: 50 mmol/L — ABNORMAL HIGH (ref 22–32)
pCO2 arterial: 58 mmHg — ABNORMAL HIGH (ref 32.0–48.0)
pH, Arterial: 7.524 — ABNORMAL HIGH (ref 7.350–7.450)
pO2, Arterial: 66 mmHg — ABNORMAL LOW (ref 83.0–108.0)

## 2018-03-28 LAB — BASIC METABOLIC PANEL
Anion gap: 11 (ref 5–15)
Anion gap: 15 (ref 5–15)
BUN: 30 mg/dL — ABNORMAL HIGH (ref 8–23)
BUN: 31 mg/dL — ABNORMAL HIGH (ref 8–23)
CO2: 42 mmol/L — ABNORMAL HIGH (ref 22–32)
CO2: 40 mmol/L — ABNORMAL HIGH (ref 22–32)
Calcium: 8.3 mg/dL — ABNORMAL LOW (ref 8.9–10.3)
Calcium: 8.6 mg/dL — ABNORMAL LOW (ref 8.9–10.3)
Chloride: 88 mmol/L — ABNORMAL LOW (ref 98–111)
Chloride: 85 mmol/L — ABNORMAL LOW (ref 98–111)
Creatinine, Ser: 2.11 mg/dL — ABNORMAL HIGH (ref 0.44–1.00)
Creatinine, Ser: 2.05 mg/dL — ABNORMAL HIGH (ref 0.44–1.00)
GFR calc Af Amer: 27 mL/min — ABNORMAL LOW (ref >60)
GFR calc Af Amer: 28 mL/min — ABNORMAL LOW (ref >60)
GFR calc non Af Amer: 23 mL/min — ABNORMAL LOW (ref >60)
GFR calc non Af Amer: 24 mL/min — ABNORMAL LOW (ref >60)
Glucose, Bld: 138 mg/dL — ABNORMAL HIGH (ref 70–99)
Glucose, Bld: 247 mg/dL — ABNORMAL HIGH (ref 70–99)
Potassium: 3.1 mmol/L — ABNORMAL LOW (ref 3.5–5.1)
Potassium: 3.6 mmol/L (ref 3.5–5.1)
Sodium: 141 mmol/L (ref 135–145)
Sodium: 140 mmol/L (ref 135–145)

## 2018-03-28 LAB — APTT: aPTT: 41 seconds — ABNORMAL HIGH (ref 24–36)

## 2018-03-28 LAB — GLUCOSE, CAPILLARY
Glucose-Capillary: 127 mg/dL — ABNORMAL HIGH (ref 70–99)
Glucose-Capillary: 142 mg/dL — ABNORMAL HIGH (ref 70–99)
Glucose-Capillary: 150 mg/dL — ABNORMAL HIGH (ref 70–99)
Glucose-Capillary: 289 mg/dL — ABNORMAL HIGH (ref 70–99)

## 2018-03-28 LAB — COOXEMETRY PANEL
Carboxyhemoglobin: 1.2 % (ref 0.5–1.5)
Methemoglobin: 1.7 % — ABNORMAL HIGH (ref 0.0–1.5)
O2 Saturation: 83.8 %
Total hemoglobin: 12.9 g/dL (ref 12.0–16.0)

## 2018-03-28 LAB — RETICULOCYTES
Immature Retic Fract: 12 % (ref 2.3–15.9)
RBC.: 3.83 MIL/uL — ABNORMAL LOW (ref 3.87–5.11)
Retic Count, Absolute: 42.9 10*3/uL (ref 19.0–186.0)
Retic Ct Pct: 1.1 % (ref 0.4–3.1)

## 2018-03-28 LAB — DIC (DISSEMINATED INTRAVASCULAR COAGULATION)PANEL
D-Dimer, Quant: 2.45 ug/mL-FEU — ABNORMAL HIGH (ref 0.00–0.50)
Fibrinogen: 766 mg/dL — ABNORMAL HIGH (ref 210–475)
INR: 1.5 — ABNORMAL HIGH (ref 0.8–1.2)
Platelets: <5 10*3/uL — CL (ref 150–400)
Prothrombin Time: 17.6 seconds — ABNORMAL HIGH (ref 11.4–15.2)
Smear Review: NONE SEEN
aPTT: 35 seconds (ref 24–36)

## 2018-03-28 LAB — SAVE SMEAR(SSMR), FOR PROVIDER SLIDE REVIEW

## 2018-03-28 LAB — LACTATE DEHYDROGENASE: LDH: 371 U/L — ABNORMAL HIGH (ref 98–192)

## 2018-03-28 LAB — PATHOLOGIST SMEAR REVIEW

## 2018-03-28 LAB — MAGNESIUM: Magnesium: 2.1 mg/dL (ref 1.7–2.4)

## 2018-03-28 LAB — PHOSPHORUS: Phosphorus: 2.1 mg/dL — ABNORMAL LOW (ref 2.5–4.6)

## 2018-03-28 MED ORDER — METHYLPREDNISOLONE SODIUM SUCC 40 MG IJ SOLR: 40.0000 mg | Freq: Once | INTRAMUSCULAR | Status: DC

## 2018-03-28 MED ORDER — IMMUNE GLOBULIN (HUMAN) 20 GM/200ML IV SOLN
1.0000 g/kg | Freq: Once | INTRAVENOUS | Status: AC
  Administered 2018-03-28: 60 g via INTRAVENOUS
  Filled 2018-03-28: qty 600

## 2018-03-28 MED ORDER — PANTOPRAZOLE SODIUM 40 MG IV SOLR
40.0000 mg | Freq: Two times a day (BID) | INTRAVENOUS | Status: DC
  Administered 2018-03-28 – 2018-03-31 (×7): 40 mg via INTRAVENOUS
  Filled 2018-03-28 (×7): qty 40

## 2018-03-28 MED ORDER — TORSEMIDE 20 MG PO TABS
100.0000 mg | ORAL_TABLET | Freq: Two times a day (BID) | ORAL | Status: DC
  Administered 2018-03-28: 100 mg via ORAL
  Filled 2018-03-28 (×2): qty 5

## 2018-03-28 MED ORDER — METHYLPREDNISOLONE SODIUM SUCC 125 MG IJ SOLR
125.0000 mg | Freq: Once | INTRAMUSCULAR | Status: AC
  Administered 2018-03-28: 125 mg via INTRAVENOUS

## 2018-03-28 MED ORDER — FUROSEMIDE 10 MG/ML IJ SOLN: 40.0000 mg | Freq: Once | INTRAMUSCULAR | Status: DC

## 2018-03-28 MED ORDER — POTASSIUM PHOSPHATES 15 MMOLE/5ML IV SOLN
30.0000 mmol | Freq: Once | INTRAVENOUS | Status: AC
  Administered 2018-03-28: 30 mmol via INTRAVENOUS
  Filled 2018-03-28: qty 10

## 2018-03-28 MED ORDER — METHYLPREDNISOLONE SODIUM SUCC 125 MG IJ SOLR
INTRAMUSCULAR | Status: AC
  Administered 2018-03-28: 125 mg via INTRAVENOUS
  Filled 2018-03-28: qty 2

## 2018-03-28 MED ORDER — RACEPINEPHRINE HCL 2.25 % IN NEBU
0.5000 mL | INHALATION_SOLUTION | Freq: Once | RESPIRATORY_TRACT | Status: AC
  Administered 2018-03-28: 0.5 mL via RESPIRATORY_TRACT

## 2018-03-28 MED ORDER — FUROSEMIDE 10 MG/ML IJ SOLN
40.0000 mg | Freq: Once | INTRAMUSCULAR | Status: AC
  Administered 2018-03-28: 40 mg via INTRAVENOUS
  Filled 2018-03-28: qty 4

## 2018-03-28 MED ORDER — RACEPINEPHRINE HCL 2.25 % IN NEBU
0.5000 mL | INHALATION_SOLUTION | Freq: Once | RESPIRATORY_TRACT | Status: AC
  Administered 2018-03-28: 0.5 mL via RESPIRATORY_TRACT
  Filled 2018-03-28: qty 0.5

## 2018-03-28 MED ORDER — POTASSIUM CHLORIDE 20 MEQ/15ML (10%) PO SOLN
40.0000 meq | ORAL | Status: DC
  Administered 2018-03-28: 40 meq via ORAL
  Filled 2018-03-28: qty 30

## 2018-03-28 NOTE — Progress Notes (Signed)
PCCM Interval Note   Called to bedside assess patient.  RN reports since transferring from ICU today, patient has been oriented but talking nonsense and having hallucinations.   Of note, hematology seeing for unexplained thrombocytopenia.  S/p solumedrol 125 mg around 10am and currently receiving IVIG.   Blood pressure (!) 147/57, pulse 96, temperature 98.1 F (36.7 C), temperature source Oral, resp. rate 15, height 5' 2.5" (1.588 m), weight 60.4 kg, SpO2 94 %.  General:  Well nourished elderly female sitting in bed in NAD HEENT: MM pink/moist, pupils 4/reactive, NGT Neuro: Alert, oriented x 3, non focal exam, MAE CV:  Rrr, +distal pulses PULM: even/non-labored, speaking full sentences Extremities: warm/dry, trace LE edema, large bruise noted to right LE Skin: multiple areas of bruising   P:  As exam is non focal, hold on CTH.  If this changes, given thrombocytopenia, would r/o ICH Continue to monitor neuro Acute delirium likely related to steroids, +/- clearing of sedation (fentanyl gtt stopped earlier prior to extubation)   [name] Rad, MSN, AGACNP-BC [facility] Pgr: [phone] or if no answer [phone] 03/28/2018, 11:06 PM

## 2018-03-28 NOTE — Progress Notes (Addendum)
Advanced Heart Failure Rounding Note   Subjective:    Admitted by CCM with fever, cough and respiratory failure requiring intubation.  Flu swab and UA negative.   Husband reports she was unable to take lasix for several days as she was throwing up. Fluid accumulated quickly.   Remains on vent. Awake and follows commands. FiO2 now 50%. Remains on Levaquin. WBC improving 12.6. Blood cx NGTD. Urine cx with E Coli. CXR looks worse this am. PCT improved 3.08 -> 2.39  Received 160 mg IV lasix x1 + metolazone yesterday am, then torsemide in afternoon. Creatinine trending up 1.71 -> 2.11. Coox 84%. CVP 4-5. I/O negative 5.8 L. K 3.1. No weight this am. Added spiro and losartan yesterday as well.   Platelets 86 -> 49 -> now <5 this morning. Heparin off since yesterday. Dark drainage in suction tubing, no other obvious bleeding.   Awake and follows commands. No complaints of pain.   Echo EF 25-30% with global HK worse from mid-ventricle to apex. RV moderately down. (Echo 1/19 EF 55-60%)  Objective:   Weight Range:  Vital Signs:   Temp:  [99.3 F (37.4 C)-100.7 F (38.2 C)] 99.3 F (37.4 C) (02/24 2300) Pulse Rate:  [78-102] 94 (02/25 0600) Resp:  [13-24] 15 (02/25 0600) BP: (78-142)/(37-105) 110/55 (02/25 0600) SpO2:  [94 %-99 %] 95 % (02/25 0600) FiO2 (%):  [50 %-75 %] 50 % (02/25 0454) Last BM Date: 03/25/18  Weight change: Filed Weights   03/26/18 0500 03/27/18 0500  Weight: 60.4 kg 60.4 kg    Intake/Output:   Intake/Output Summary (Last 24 hours) at 03/28/2018 0735 Last data filed at 03/28/2018 0700 Gross per 24 hour  Intake 506.5 ml  Output 6300 ml  Net -5793.5 ml     Physical Exam: General: Intubated. Awake on vent.  HEENT: + ETT Neck: Supple. JVP difficult. Carotids 2+ bilat; no bruits. No thyromegaly or nodule noted. Cor: PMI nondisplaced. RRR, 2/6 AS Lungs: Diminished basilar sounds Abdomen: Soft, non-tender, non-distended, no HSM. No bruits or masses. +BS   Extremities: No cyanosis, clubbing, or rash. Trace ankle edema. RUE PICC. SCDs on.  Neuro: Intubated. Awake on vent.   Telemetry: NSR 80s with LBBB. Personally reviewed.    Labs: Basic Metabolic Panel: Recent Labs  Lab 03/25/18 1630  03/26/18 0522 03/27/18 0434 03/27/18 0932 03/28/18 0455 03/28/18 0512  NA 138   < > 141 141 138 139 141  K 4.1   < > 4.9 3.3* 3.1* 3.2* 3.1*  CL 101  --  101 92*  --   --  88*  CO2 26  --  22 35*  --   --  42*  GLUCOSE 166*  --  135* 120*  --   --  138*  BUN 15  --  22 23  --   --  30*  CREATININE 1.34*  --  1.68* 1.71*  --   --  2.11*  CALCIUM 9.1  --  8.5* 8.2*  --   --  8.3*  MG 2.0  --  2.1 1.8  --   --  2.1  PHOS 2.8  --  3.6 2.9  --   --  2.1*   < > = values in this interval not displayed.    Liver Function Tests: Recent Labs  Lab 03/25/18 1630  AST 38  ALT 22  ALKPHOS 72  BILITOT 1.5*  PROT 7.3  ALBUMIN 3.9   Recent Labs  Lab 03/25/18 1630  LIPASE 19   No results for input(s): AMMONIA in the last 168 hours.  CBC: Recent Labs  Lab 03/25/18 1630  03/26/18 0522 03/27/18 0434 03/27/18 0637 03/28/18 0455 03/28/18 0512  WBC 9.4  --  11.4* 13.5*  --   --  12.6*  NEUTROABS 8.1*  --   --   --   --   --   --   HGB 13.4   < > 13.1 12.8 13.9 12.9 12.5  HCT 43.9   < > 41.0 42.3 41.0 38.0 41.1  MCV 101.4*  --  98.6 100.0  --   --  99.5  PLT 104*  --  86* 49*  --   --  <5*   < > = values in this interval not displayed.    Cardiac Enzymes: Recent Labs  Lab 03/25/18 1630 03/25/18 2230 03/26/18 0522  TROPONINI 0.05* 0.40* 0.48*    BNP: BNP (last 3 results) Recent Labs    01/16/18 2150 03/25/18 1630  BNP 701.3* 1,480.1*    ProBNP (last 3 results) No results for input(s): PROBNP in the last 8760 hours.    Other results:  Imaging: Dg Chest Port 1 View  Result Date: 03/27/2018 CLINICAL DATA:  Endotracheal tube present.  Respiratory failure. EXAM: PORTABLE CHEST 1 VIEW COMPARISON:  One-view chest x-ray  03/25/2018. FINDINGS: The heart is enlarged. Endotracheal tube is in stable and satisfactory position. A new right PICC line is in place. The heart is enlarged. Bilateral interstitial and airspace disease is again noted. IMPRESSION: 1. Interval placement of right-sided PICC line. The tip is at the cavoatrial junction. 2. Stable cardiomegaly and diffuse interstitial and airspace disease, likely edema and CHF. Electronically Signed   By: San Morelle M.D.   On: 03/27/2018 08:37   [place] Ekg Site Rite  Result Date: 03/26/2018 If [facility] image not attached, placement could not be confirmed due to current cardiac rhythm.    Medications:     Scheduled Medications: . amiodarone  100 mg Per NG tube Daily  . arformoterol  15 mcg Nebulization BID  . budesonide (PULMICORT) nebulizer solution  0.25 mg Nebulization BID  . buPROPion  75 mg Oral BID  . chlorhexidine gluconate (MEDLINE KIT)  15 mL Mouth Rinse BID  . Chlorhexidine Gluconate Cloth  6 each Topical Daily  . cholecalciferol  2,000 Units Oral Q breakfast  . clopidogrel  75 mg Per NG tube Daily  . ezetimibe  10 mg Per Tube QHS  . gabapentin  1,200 mg Oral QHS  . gabapentin  600 mg Oral BID  . insulin aspart  2-6 Units Subcutaneous Q4H  . insulin glargine  25 Units Subcutaneous Q2200  . losartan  12.5 mg Oral Daily  . mouth rinse  15 mL Mouth Rinse 10 times per day  . multivitamin  1 tablet Oral Daily  . pantoprazole sodium  40 mg Per Tube BID  . rosuvastatin  40 mg Per NG tube QHS  . sodium chloride flush  10-40 mL Intracatheter Q12H  . spironolactone  12.5 mg Oral Daily  . torsemide  100 mg Oral BID  . vitamin B-12  1,000 mcg Oral Daily    Infusions: . sodium chloride 10 mL/hr at 03/28/18 0700  . fentaNYL infusion INTRAVENOUS 75 mcg/hr (03/28/18 0700)  . [START ON 03/29/2018] levofloxacin (LEVAQUIN) IV    . norepinephrine (LEVOPHED) Adult infusion Stopped (03/26/18 2031)    PRN Medications: sodium chloride, acetaminophen  (TYLENOL) oral liquid 160 mg/5 mL, ALPRAZolam,  bisacodyl, diphenhydrAMINE, docusate, fentaNYL, fentaNYL (SUBLIMAZE) injection, hydrALAZINE, ipratropium-albuterol, nitroGLYCERIN, promethazine, sodium chloride flush   Assessment:   [name] [name] is a 73 y.o. female with multiple medical problems including a hx of CAD s/p CABG and multiple PCI, HFpEF s/p CardioMEMS, AF, cerebrovascular disease, CKD, DM2, PAD, admitted 2/22 with acute respiratory failure requiring mechanical intubation in the setting of URI with fever to 102 and cough.   Plan/Discussion:    1. Acute hypoxic respiratory failure  - I suspect inciting event was infectious in nature but now main issue appears to be acute systolic HF with EF newly down and pulmonary edema on CXR - Echo 03/26/18 EF 25-30% with global HK worse from midventricle to apex. RV moderately down. (Echo 1/19 EF 55-60%) - question Tako-tsubo vs viral-mediated - Troponin flat at 0.4 doubt ACS. ECG with LBBB (chronic) - CVP now 4-5 - Improved 50% FiO2. CXR looks slightly worse today. Volume is much improved.   2. Acute systolic HF - discussion as above. Previously with h/o diastolic HF. S/p Cardiomems  - Echo 03/26/18 EF 25-30% with global HK worse from midventricle to apex. RV moderately down. (Echo 1/19 EF 55-60%) - question Tako-tsubo vs viral-mediated vs HTN - Volume much improved. CVP 4-5 - Hold diuretics today with AKI and stable CVP.  - Continue losartan 12.5 mg daily. BP dropped with Entresto.  - Continue spiro 12.5 mg daily  3. ID - suspect viral URI. Flu negative. - send respiratory panel. PCT 0.32 -> 3.08 -> 2.39 - CCM managing vent. Continue Levaquin.  - CXR looks worse today, but volume much improved.   4. CAD s/p CABG and CTO RCA - Trop flat at 0.4. Doubt ACS  - Last cath 9/19 with severe native CAD and LIMA patent. Sequential SVG-PLOM/OM1 with 80% proximal stenosis, s/p DES. SVG to RCA chronically occluded - continue ASA and statin. Plavix  on hold. See below.   5. PAF - Remains in NSR on po amio. On Eliquis at home.  - Platelets trending down 86 -> 49 -> <5. Low probability for HIT per pharmD. Heparin on hold since yesterday. Stop plavix. See below.   6. Severe HTN - SBP soft overnight 78-100s. Now on low dose spiro and losartan.   7. Morbid obesity with OHS/OSA  - intolerant CPAP. Remains intubated. No change.   8. DM2 - per primary team. No change.    9. Acute on CKD 3 - recent baseline seems to be 1.4-1.9. Creatinine up to 2.11. Hold diuresis as above.   10. H/o CVA - stable. No change.   11. Hypokalemia/Hypomagnesium - K 3.1, mag 2.1. Supp K.   12. Thrombocytopenia - Platelets now <5. Heparin off since yesterday. Stop plavix.  - Repeat CBC. Consult hematology today. - No obvious bleeding.   13. Constipation - Repeat sorbitol    Length of Stay: [place] NP 03/28/2018, 7:35 AM  Advanced Heart Failure Team Pager [phone] (M-F; 7a - 4p)  Please contact [facility] for night-coverage after hours (4p -7a ) and weekends on [email or web address]  Patient seen with NP, agree with the above note.   She is intubated this morning but awake.  CXR still with bilateral airspace disease.  She diuresed very well yesterday again.  Tm 100.4.  Co-ox 84%, CVP 10 on my measure this morning, creatinine up to 2.1. Antibiotics narrowed to levofloxacin.  PCT 2.39 today.  Plts ?<5 today, no overt bleeding and hgb stable.  On exam, awake/alert/follows  commands.  JVP 8-9 cm.  Coarse BS.  No edema.    1. Acute hypoxemic respiratory failure: Suspect combination of pulmonary edema and likely PNA.  FiO2 0.5 today. CXR with diffuse bilateral airspace disease and PCT 3.08=>2.39. Diuresed well yesterday, CVP 10 now but creatinine up.  - Hold am torsemide, restart torsemide 100 mg bid in the afternoon.   - Continue levofloxacin.   - Wean vent as able, ?extubate today.  2. ID: Suspect PNA.  PCT 2.59 today.  Initial fever to  102.6, low grade 100.4 today.  WBCs 13.5 => 12.6.  - Continue levofloxacin.   3. Acute systolic CHF: Echo with EF 25-30%, severe hypokinesis-akinesis of the mid-apical LV myocardium.  Prior echo with normal EF.  ?Takotsubo event with stress of acute illness.  However, she has known extensive coronary disease as well.  Good Co-ox at 83%, CVP 10.  Creatinine up to 2.11.  - As above, restart torsemide 100 mg bid this afternoon.  - Add spironolactone 12.5 daily.  - Stop losartan with soft BP and rise in creatinine.   - When creatinine and plts stabilize, will likely do eventual cath.  4. CKD: stage 3, baseline 1.4-1.8.  This morning, creatinine 2.11 with aggressive diuresis yesterday.  Back off on diuretics this morning, resume home po regimen in pm. Follow closely.  5. CAD: Last cath 9/19 with severe native CAD and LIMA patent. Sequential SVG-PLOM/OM1 with 80% proximal stenosis, s/p DES. SVG to RCA chronically occluded. No chest pain.  Mild elevation in troponin with no trend, likely demand ischemia. Echo with newly low EF, may be Takotsubo.  - Continue statin, Plavix on hold with low plts.   - Possible eventual cath with fall in EF.  6. Atrial fibrillation: Paroxysmal.  She is in NSR. Eliquis on hold for possible procedure and heparin started. Platelets started to fall, heparin stopped yesterday and now plts < 5.  7. Thrombocytopenia: ?Sepsis/infection related, thought less likely from HIT (score low).  Platelets down to <5 today (?accuracy).   - Off heparin and stopping Plavix.  - Will send HIT but with markedly low plts think less likely.  - Repeat CBC, if truly < 5 will need transfusion.   - SCDs.  - Will consult hematology.   CRITICAL CARE Performed by: Loralie Champagne  Total critical care time: 40 minutes  Critical care time was exclusive of separately billable procedures and treating other patients.  Critical care was necessary to treat or prevent imminent or life-threatening  deterioration.  Critical care was time spent personally by me on the following activities: development of treatment plan with patient and/or surrogate as well as nursing, discussions with consultants, evaluation of patient's response to treatment, examination of patient, obtaining history from patient or surrogate, ordering and performing treatments and interventions, ordering and review of laboratory studies, ordering and review of radiographic studies, pulse oximetry and re-evaluation of patient's condition.  Loralie Champagne 03/28/2018 8:16 AM

## 2018-03-28 NOTE — Progress Notes (Addendum)
NAME:  [name] [name], [name]:  [id number], DOB:  November 02, 1945, LOS: 3 ADMISSION DATE:  03/25/2018, CONSULTATION DATE:  03/25/2018 REFERRING MD:  ED physician, CHIEF COMPLAINT:  Acute hypoxic respiratory failure   Brief History   73 year old woman with history of asthma, OSA not on CPAP, chronic hypoxic respiratory failure on 2L at night, CAD s/p CABG and multiple PCI, HFpEF s/p CardioMEMS, AF on Eliquis, cerebrovascular disease, HTN, CKD, DM2, PAD presenting with cough found in the ED to have fever, hypertensive urgency, and acute on chronic hypoxic respiratory failure. She was intubated in the ED and given IV Lasix, aztreonam and NTG gtt.  History of present illness   73 year old woman with history of asthma, OSA not on CPAP, chronic hypoxic respiratory failure on 2L at night, CAD s/p CABG and multiple PCI, HFpEF s/p CardioMEMS, AF on Eliquis, cerebrovascular disease, HTN, CKD, DM2, PAD presenting with cough. History obtained from daughter, husband, and EMR due to intubation and sedation. She has been feeling poorly for the past week with cough productive of sputum. She has been having intermittent emesis and post tussive emesis. She has not been able to take her medications because of her emesis. She had a fall yesterday because of weakness. She was noted to be hypoxic to the 70s this afternoon when her daughter checked.   She lives at home with her husband. She is only able to walk a few feet before she gets dyspneic. She just recently got a rollator to help with mobility. Her husband is her HCPOA.  In the ED, pt febrile to 102.6 with SBP 170-209, HR 70s, O2 in the mid 80s. ECG showed sinus rhythm with LBBB-like IVCD. VBG 7.38/50. Labs included Cr 1.34 (at/improved from baseline), BNP 1480 (last 701 in 01/2018), troponin 0.05. Lactate 1.8. WBC 9.4. Flu panel negative. CXR showed cardiomegaly and bilateral infiltrates. She was intubated in the ED and started on IV aztreonam. IV furosemide was given, and  NTG infusion was started for BP control.   Past Medical History  asthma, OSA not on CPAP, chronic hypoxic respiratory failure on 2L at night, CAD s/p CABG and multiple PCI, HFpEF s/p CardioMEMS, AF on Eliquis, cerebrovascular disease, HTN, CKD, DM2, PAD  Significant Hospital Events   2/22 > admit, intubated  Consults:  Cardiology 2/22  Procedures:  Intubated 2/22>>>   Significant Diagnostic Tests:  CXR 2/22 > Cardiomegaly. Worsening bilateral airspace disease CXR 2/24> pulmonary edema pattern CXR 2/25> worsening RUL opacity   Micro Data:  BCx x 2 2/22 >> no growth 24 hours  Resp cx 2/22 >>  UCx 2/22 >> e coli  Influenza PCR 2/22 >> neg  Antimicrobials:  Aztreonam 2/22 Levaquin 2/22 >> Vancomycin 2/23>>  Cefepime 2/23>>  Interim history/subjective:  NAEO Improved nausea with phenergan   Objective   Blood pressure (!) 110/55, pulse 94, temperature 99.3 F (37.4 C), temperature source Oral, resp. rate 15, height 5' 2.5" (1.588 m), weight 60.4 kg, SpO2 95 %. CVP:  [7 mmHg-14 mmHg] 14 mmHg  Vent Mode: PRVC FiO2 (%):  [50 %-75 %] 50 % Set Rate:  [15 bmp-26 bmp] 26 bmp Vt Set:  [410 mL-520 mL] 520 mL PEEP:  [5 cmH20-10 cmH20] 10 cmH20 Plateau Pressure:  [20 cmH20-25 cmH20] 24 cmH20   Intake/Output Summary (Last 24 hours) at 03/28/2018 0744 Last data filed at 03/28/2018 0700 Gross per 24 hour  Intake 506.5 ml  Output 6300 ml  Net -5793.5 ml   Filed Weights   03/26/18 0500  03/27/18 0500  Weight: 60.4 kg 60.4 kg    Examination:  General: Obese, older adult female, awake, intubated, seated in bed, NAD  HENT: NCAT, ETT secure, mmm Lungs: Diminished bibasilar breath sounds, scattered crackles bilaterally.  Cardiovascular: RRR s1s2 no r/g/m  Abdomen: soft, round, ndnt, normoactive x4  Extremities: Symmetrical bulk and tone, trace pedal edema  Neuro: Awake, alert, calm, interactive Follows commands.  GU: Foley collecting clear yellow urine   Assessment & Plan:    73 year old woman with history of asthma, OSA not on CPAP, chronic hypoxic respiratory failure on 2L at night, CAD s/p CABG and multiple PCI, HFpEF s/p CardioMEMS, AF on Eliquis, cerebrovascular disease, HTN, CKD, DM2, PAD presenting with cough found in the ED to have fever, hypertensive urgency, and acute on chronic hypoxic respiratory failure.   Acute on chronic hypoxic respiratory failure acute on chronic HFpEF  ? CAP with PCT 3.08 Asthma. -2/24 AM CXR with increased pulm edema pattern  2/25 CXR worse RUL opacity  2/25 ABG 9/52/58/66/47.9 -- contraction alkalosis likely related to diuresis  P -Cardiology following, appreciate recs for CHF component  -Cardiology recs: hold diuretics today (2/25), however will give 40 lasix this morning given CXR.  - Strict in/out, daily weights -CVP qshift -AM CXR  -Wean FiO2/PEEP for SpO2 > 92%  -Continue brovanna, pulmicort -continue levaquin -continue to follow resp cx -Delirium precautions   Acute Systolic HF HTN -resolved Hypertensive urgency, lactic acidosis: BP up to 209/123 (MAP 152) in ED. Dropped as low as 84/45 (MAP 58) after patient was intubated and sedated. Off NTG gtt now. -Trop elevated to 0.48, likely demand ischemia P -Cardiology following appreciate recs  -Per cardiology,losartan 12.5mg  qD, spiro 12.5 qD  Thrombocytopenia 2/24 plt 49, 2/25 plt <5  P Heparin previously dc'd, stop plavix HIT panel, although timeline of thrombocytopenia does not align with typical HIT presentation  Repeat plt to rule out lab error Following repeat lab, transfuse plt if <10 If Plt low on repeat lab, consider heme consult Trend CBC  Electrolyte abnormalities  -hypokalemia, likely related to aggressive diuresis P Continue to monitor BMP and replace lytes PRN   CAD s/p CABG and multiple PCI:  - home plavix, Zetia, statin, Ranexa P -hold plavix in setting of thrombocytopenia as above    AF on Eliquis:  Continue amiodarone No current  chemical anticoagulation in setting of thrombocytopenia  Acute Kidney Injury on CKD3:  -baseline Cr 1.7, increased to 2.11 (2/25) P Increased Cr 2.11 today, likely in setting of aggressive diuresis Strict I/O  Daily weight Trend BMP   DM2: - Continue home gabapentin and renally adjusted dose - Continue home Lantus at reduced dose, SSI with monitoring  Nausea -per family, patient responds better to home phenergan with benadryl than zofran for nausea -GERD P -Continue PRN Phenergan and PRN benadryl  -Changing Per Tube protonix to IV   Best practice:  Diet: NPO Pain/Anxiety/Delirium protocol (if indicated): fent gtt/PRN, delirium precautions VAP protocol (if indicated): ordered DVT prophylaxis: SCDs GI prophylaxis: PPI Glucose control: SSI Mobility: as tolerated  Code Status: FULL Family Communication: daughter at bedside, updated daughter and patient Disposition: ICU  Labs   CBC: Recent Labs  Lab 03/25/18 1630  03/26/18 0522 03/27/18 0434 03/27/18 0637 03/28/18 0455 03/28/18 0512  WBC 9.4  --  11.4* 13.5*  --   --  12.6*  NEUTROABS 8.1*  --   --   --   --   --   --   HGB 13.4   < >  13.1 12.8 13.9 12.9 12.5  HCT 43.9   < > 41.0 42.3 41.0 38.0 41.1  MCV 101.4*  --  98.6 100.0  --   --  99.5  PLT 104*  --  86* 49*  --   --  <5*   < > = values in this interval not displayed.    Basic Metabolic Panel: Recent Labs  Lab 03/25/18 1630  03/26/18 0522 03/27/18 0434 03/27/18 0637 03/28/18 0455 03/28/18 0512  NA 138   < > 141 141 138 139 141  K 4.1   < > 4.9 3.3* 3.1* 3.2* 3.1*  CL 101  --  101 92*  --   --  88*  CO2 26  --  22 35*  --   --  42*  GLUCOSE 166*  --  135* 120*  --   --  138*  BUN 15  --  22 23  --   --  30*  CREATININE 1.34*  --  1.68* 1.71*  --   --  2.11*  CALCIUM 9.1  --  8.5* 8.2*  --   --  8.3*  MG 2.0  --  2.1 1.8  --   --  2.1  PHOS 2.8  --  3.6 2.9  --   --  2.1*   < > = values in this interval not displayed.   GFR: Estimated  Creatinine Clearance: 20.1 mL/min (A) (by C-G formula based on SCr of 2.11 mg/dL (H)). Recent Labs  Lab 03/25/18 1630 03/25/18 2126 03/25/18 2230 03/26/18 0003 03/26/18 0522 03/27/18 0434 03/28/18 0512  PROCALCITON  --   --  0.32  --   --  3.08  --   WBC 9.4  --   --   --  11.4* 13.5* 12.6*  LATICACIDVEN 1.8 2.2*  --  1.7  --   --   --     Liver Function Tests: Recent Labs  Lab 03/25/18 1630  AST 38  ALT 22  ALKPHOS 72  BILITOT 1.5*  PROT 7.3  ALBUMIN 3.9   Recent Labs  Lab 03/25/18 1630  LIPASE 19   ABG    Component Value Date/Time   PHART 7.524 (H) 03/28/2018 0455   PCO2ART 58.0 (H) 03/28/2018 0455   PO2ART 66.0 (L) 03/28/2018 0455   HCO3 47.9 (H) 03/28/2018 0455   TCO2 50 (H) 03/28/2018 0455   ACIDBASEDEF 2.0 01/28/2010 1651   O2SAT 83.8 03/28/2018 0530     Coagulation Profile: Recent Labs  Lab 03/25/18 1630  INR 1.60    Cardiac Enzymes: Recent Labs  Lab 03/25/18 1630 03/25/18 2230 03/26/18 0522  TROPONINI 0.05* 0.40* 0.48*    HbA1C: Hgb A1c MFr Bld  Date/Time Value Ref Range Status  10/21/2017 05:12 AM 8.0 (H) 4.8 - 5.6 % Final    Comment:    (NOTE) Pre diabetes:          5.7%-6.4% Diabetes:              >6.4% Glycemic control for   <7.0% adults with diabetes   07/18/2013 08:16 PM 7.2 (H) <5.7 % Final    Comment:    (NOTE)  According to the ADA Clinical Practice Recommendations for 2011, when HbA1c is used as a screening test:  >=6.5%   Diagnostic of Diabetes Mellitus           (if abnormal result is confirmed) 5.7-6.4%   Increased risk of developing Diabetes Mellitus References:Diagnosis and Classification of Diabetes Mellitus,Diabetes UMPN,3614,43(XVQMG 1):S62-S69 and Standards of Medical Care in         Diabetes - 2011,Diabetes QQPY,1950,93 (Suppl 1):S11-S61.    Critical Care Time 45 minutes   Eliseo Gum MSN, AGACNP-BC [facility] 03/28/2018, 7:44 AM  Attending Note:  73 year old female with PMH of asthma and OSA on CPAP who presents with hypoxemic respiratory failure due to pulmonary edema and CHF.  Patient was diuresed overnight and weaning very well this AM on exam.  I reviewed CXR myself, ETT is in a good position and pulmonary edema is improving.  Discussed with PCCM-NP.  Will proceed with extubation today.  Continue CHF treatment per cardiology.  Maintain I/O to negative but D/C the high dose ordered prior.  Will order SLP, PT, OOB and if stable this afternoon will transfer out of the ICU and to [facility] service.   The patient is critically ill with multiple organ systems failure and requires high complexity decision making for assessment and support, frequent evaluation and titration of therapies, application of advanced monitoring technologies and extensive interpretation of multiple databases.   Critical Care Time devoted to patient care services described in this note is  33  Minutes. This time reflects time of care of this signee Dr Jennet Maduro. This critical care time does not reflect procedure time, or teaching time or supervisory time of PA/NP/Med student/Med Resident etc but could involve care discussion time.  Rush Farmer, M.D. [facility] Pulmonary/Critical Care Medicine. Pager: [phone]. After hours pager: [phone].

## 2018-03-28 NOTE — Progress Notes (Signed)
PT Cancellation Note  Patient Details Name: [name] [name] [name]: [id number] DOB: 05/18/1945   Cancelled Treatment:    Reason Eval/Treat Not Completed: Patient not medically ready(just extubated, still with some Resp issues). Will follow for appropriateness   Duncan Dull 03/28/2018, 10:51 AM Alben Deeds, PT DPT  Board Certified Neurologic Specialist [place] Pager [phone] Office [phone]

## 2018-03-28 NOTE — Procedures (Signed)
Extubation Procedure Note  Patient Details:   Name: [name] [name] DOB: 07-Mar-1945 [name]: [id number]   Airway Documentation:    Vent end date: 03/28/18 Vent end time: 1020   Evaluation  O2 sats: stable throughout Complications: No apparent complications Patient did tolerate procedure well. Bilateral Breath Sounds: Stridor   Yes   Patient did not have a cuff leak prior to extubation. MD aware. Patient did develop stridor post extubation. RN was at the bedside with RT during extubation. Patient has had two rounds of inhaled racemic epinephrine. She is currently on a 55% venturi mask.   Renato Gails [facility] 03/28/2018, 10:39 AM

## 2018-03-28 NOTE — Progress Notes (Signed)
Pharmacy Heparin Induced Thrombocytopenia (HIT) Note:  [name] [name] is an 72 y.o. female being evaluated for HIT. Heparin was started 2/22 for atrial fibrillation, and baseline platelets were 104.   HIT labs were ordered on 2/25 when platelets dropped to <5 (being repeated on 2/25).  Auto-populate labs: No results found for: HEPINDPLTAB, SRALOWDOSEHP, SRAHIGHDOSEH   CALCULATE SCORE:  4Ts (see the HIT Algorithm) Score  Thrombocytopenia 2  Timing 0 (drop <4 days however heparin documented as received within last 100 days)  Thrombosis 0  Other causes of thrombocytopenia 1  Total 3    Recommendations (A or B or C) are based on available lab results:  A. No lab results available (HIT antibody and/or SRA ordered): -Low HIT probability: consider discontinuing HIT labs, continue heparin/LMWH, SRA not recommended; no heparin allergy documentation needed   Heparin discontinued on 2/25 given platelet drop and in NSR. Given continued platelet drop, will order HIT antibody to rule out at request of cardiology team. Will document heparin allergy while awaiting lab results.  Name of MD Contacted: Dr [name]  Plan (Discussed with provider) Labs ordered: -Heparin antibody Heparin allergy: -documented or updated Anticoagulation plans -discontinue heparin / LMWH   Comments (List any alternative plans or if there are contraindications to therapy)  Antonietta Jewel, PharmD, BCCCP Clinical Pharmacist  Pager: [phone] Phone: [phone] 03/28/2018, 8:08 AM

## 2018-03-28 NOTE — Consult Note (Addendum)
[place]  [phone] Fax:([phone] [place]  Referring MD: Dr. Loralie Champagne   Reason for Referral: Thrombocytopenia  HPI: Ms. Fenstermacher is a 73 year old woman with history of asthma, OSA not on CPAP, chronic hypoxic respiratory failure on 2L at night, CAD s/p CABG and multiple PCI, HFpEF s/p CardioMEMS, AF on Eliquis, cerebrovascular disease, HTN, CKD, DM2, PAD presenting with cough found in the ED to have fever, hypertensive urgency, and acute on chronic hypoxic respiratory failure. She was intubated in the ED and given IV Lasix, aztreonam and NTG gtt. She has been feeling poorly for the week prior to admission with cough productive of sputum. She was having intermittent emesis and post tussive emesis.She was not taking her medications because of her emesis. She had a fall the day prior to admission because of weakness. She was noted to be hypoxic to the 70s at home on the day of admission.    Labs since admission have been reviewed. On th day of admission her platelet count was slightly low at 104,000 (previously normal at 192,000 on 02/08/2018). Platelet count has progressively declined to <5,000 this morning (this was repeated and verified). Blood cultures are negative to date. Urine culture with 10,000 colonies/mL of E coli. On Levaquin and cefepime. Heparin was stopped on 03/27/2018. Plavix has been discontinued (last dose given on 03/27/2018).   The patient's daughter was at the bedside when seen today.  The patient was extubated shortly before I arrived.  The patient reports ongoing fatigue.  Has been having intermittent headaches for at least a few weeks.  Denied having fevers or chills prior to admission.  Has been having episodes of chest pain and using nitroglycerin as needed.  Denies abdominal pain but has had nausea and vomiting on and off for the past 2 weeks.  The patient's daughter tells me that she has bruising on her back and  on her bilateral shoulders related to a recent fall prior to admission.  She reports blood-tinged sputum with suctioning.  Denies epistaxis, bleeding gums, hemoptysis.  Denies hematuria, melena, hematochezia.  We were asked to see the patient for evaluation and recommendations regarding her thrombocytopenia.   Past Medical History:  Diagnosis Date  . Abnormality of gait 05/14/2014  . Anemia    hx  . Anginal pain ([place])   . Anxiety   . Asthma   . Basal cell carcinoma 05/2014   "left shoulder"  . Bundle branch block, left    chronic/notes 07/18/2013  . CHF (congestive heart failure) ([place])   . Chronic bronchitis ([place])    "off and on all the time" (07/18/2013)  . Chronic insomnia 05/06/2015  . Chronic kidney disease    frequency, sees dr Jamal Maes every 4 to 6 months (01/16/2018)  . Chronic low back pain 08/24/2016  . Chronic lower back pain   . Claustrophobia   . Common migraine 05/14/2014  . Coronary artery disease   . Depression   . Dysrhythmia   . GERD (gastroesophageal reflux disease)   . H/O hiatal hernia   . Headache    "at least 2/month" (01/16/2018)  . Heart murmur   . Hyperlipidemia   . Hypertension   . Irregular heart beat   . Leg cramps    both legs at times  . Melanoma ([place]) 05/2014   "burned off BLE" (01/16/2018)  . Memory change 05/14/2014  . Migraine    "5-6/year"  (01/16/2018)  . Myocardial infarction (  [place]) 04/1999, 02/2000, 01/2005; 2011; 2014  . Neuromuscular disorder ([place])    ?  . Obesity 01-2010  . Obstructive sleep apnea    "can't wear machine; I have claustrophobia" (01/16/2018)  . On home oxygen therapy    "2L at night and prn during daytime" (01/16/2018)  . Osteoarthritis    "knees and hands" (01/16/2018)  . Other and unspecified angina pectoris   . Peripheral vascular disease (H[facility])    ? numbness, tingling arms and legs  . Pneumonia 2000's   "once"  . PONV (postoperative nausea and vomiting)   . Shortness of breath    with exertion  .  Stroke ([place]) 03/22/12   right side brain; denies residual on 07/18/2013)  . Stroke [facility]) Oct. 2015   Affected pt.s balance  . Stroke [facility]) 10/2014   "affected my legs; fully recovered now"; still have sporatic memory issues from this one" (01/16/2018)  . Type II diabetes mellitus ([place])   . Ventral hernia    hx of  . Vomiting    persistent  . Vomiting blood   :    Past Surgical History:  Procedure Laterality Date  . ANKLE FRACTURE SURGERY Left 1970's  . APPENDECTOMY  1970's   w/hysterectomy  . BASAL CELL CARCINOMA EXCISION Left 05/2014   "shoulder" (01/16/2018)  . CARDIAC CATHETERIZATION  10/10/2012   Dr [name].  Marland Kitchen CARDIAC CATHETERIZATION N/A 05/29/2015   Procedure: Right/Left Heart Cath and Coronary/Graft Angiography;  Surgeon: Larey Dresser, MD;  Location: [facility] CV LAB;  Service: Cardiovascular;  Laterality: N/A;  . CAROTID ENDARTERECTOMY Left 03/2013  . CAROTID STENT INSERTION Left 03/20/2013   Procedure: CAROTID STENT INSERTION;  Surgeon: [name], MD;  Location: [facility] CATH LAB;  Service: Cardiovascular;  Laterality: Left;  internal carotid  . CEREBRAL ANGIOGRAM N/A 04/05/2011   Procedure: CEREBRAL ANGIOGRAM;  Surgeon: Angelia Mould, MD;  Location: [facility] CATH LAB;  Service: Cardiovascular;  Laterality: N/A;  . CHOLECYSTECTOMY OPEN  2004  . CORONARY ANGIOPLASTY WITH STENT PLACEMENT  01,02,05,06,07,08,11; 04/24/2013   "I've probably got ~ 10 stents by now" (04/24/2013)  . CORONARY ANGIOPLASTY WITH STENT PLACEMENT  06/13/2013   "got 4 stents today" (06/13/2013)  . CORONARY ARTERY BYPASS GRAFT  1220/11   "CABG X5"  . CORONARY STENT INTERVENTION N/A 10/20/2017   Procedure: CORONARY STENT INTERVENTION;  Surgeon: Troy Sine, MD;  Location: [place] [facility];  Service: Cardiovascular;  Laterality: N/A;  . ESOPHAGOGASTRODUODENOSCOPY  08/03/2011   Procedure: ESOPHAGOGASTRODUODENOSCOPY (EGD);  Surgeon: Shann Medal, MD;  Location: [facility];  Service: General;   Laterality: N/A;  . ESOPHAGOGASTRODUODENOSCOPY (EGD) WITH PROPOFOL N/A 03/11/2014   Procedure: ESOPHAGOGASTRODUODENOSCOPY (EGD) WITH PROPOFOL;  Surgeon: Lafayette Dragon, MD;  Location: [facility] ENDOSCOPY;  Service: Endoscopy;  Laterality: N/A;  . FRACTURE SURGERY    . gall stone removal  05/2003  . GASTRIC RESTRICTION SURGERY  1984   "stapeling"  . HERNIA REPAIR  2004   "in my stomach; had OR on it twice", wire mesh on 1 hernia  . LEFT HEART CATH AND CORS/GRAFTS ANGIOGRAPHY N/A 07/09/2016   Procedure: LEFT HEART CATH AND CORS/GRAFTS ANGIOGRAPHY;  Surgeon: Larey Dresser, MD;  Location: [place] [facility];  Service: Cardiovascular;  Laterality: N/A;  . OVARY SURGERY  1970's   "tumor removed"  . PERCUTANEOUS CORONARY STENT INTERVENTION (PCI-S) N/A 06/13/2013   Procedure: PERCUTANEOUS CORONARY STENT INTERVENTION (PCI-S);  Surgeon: [name], MD;  Location: [facility] CATH LAB;  Service:  Cardiovascular;  Laterality: N/A;  . PERCUTANEOUS STENT INTERVENTION N/A 04/24/2013   Procedure: PERCUTANEOUS STENT INTERVENTION;  Surgeon: [name], MD;  Location: [facility] CATH LAB;  Service: Cardiovascular;  Laterality: N/A;  . RIGHT/LEFT HEART CATH AND CORONARY/GRAFT ANGIOGRAPHY N/A 10/20/2017   Procedure: RIGHT/LEFT HEART CATH AND CORONARY/GRAFT ANGIOGRAPHY;  Surgeon: Larey Dresser, MD;  Location: [place] [facility] LAB;  Service: Cardiovascular;  Laterality: N/A;  . ROOT CANAL  10/2000  . TIBIA FRACTURE SURGERY Right 1970's   rods and pins  . TOOTH EXTRACTION     "1 on the upper; wisdom tooth on the lower" (01/16/2018)  . TOTAL ABDOMINAL HYSTERECTOMY  1970's   w/ appendectomy  :   CURRENT MEDS: Current Facility-Administered Medications  Medication Dose Route Frequency Provider Last Rate Last Dose  . 0.9 %  sodium chloride infusion   Intravenous PRN Rush Farmer, MD 10 mL/hr at 03/28/18 0700    . acetaminophen (TYLENOL) solution 1,000 mg  1,000 mg Per Tube Q6H PRN Rush Farmer, MD   1,000 mg at  03/27/18 0825  . ALPRAZolam Duanne Moron) tablet 0.5 mg  0.5 mg Oral TID PRN Milagros Loll, MD      . amiodarone (PACERONE) tablet 100 mg  100 mg Per NG tube Daily Nila Nephew, MD   100 mg at 03/27/18 1100  . arformoterol (BROVANA) nebulizer solution 15 mcg  15 mcg Nebulization BID Milagros Loll, MD   15 mcg at 03/28/18 0813  . bisacodyl (DULCOLAX) suppository 10 mg  10 mg Rectal Daily PRN Jacques Earthly T, MD      . budesonide (PULMICORT) nebulizer solution 0.25 mg  0.25 mg Nebulization BID Jacques Earthly T, MD   0.25 mg at 03/28/18 0813  . buPROPion [facility]) tablet 75 mg  75 mg Oral BID Milagros Loll, MD   75 mg at 03/27/18 2102  . chlorhexidine gluconate (MEDLINE KIT) (PERIDEX) 0.12 % solution 15 mL  15 mL Mouth Rinse BID Milagros Loll, MD   15 mL at 03/28/18 0847  . Chlorhexidine Gluconate Cloth 2 % PADS 6 each  6 each Topical Daily Rush Farmer, MD   6 each at 03/28/18 0007  . cholecalciferol (VITAMIN D3) tablet 2,000 Units  2,000 Units Oral Q breakfast Milagros Loll, MD   2,000 Units at 03/28/18 [phone]  . diphenhydrAMINE (BENADRYL) injection 25 mg  25 mg Intravenous Q6H PRN Bowser, Laurel Dimmer, NP   25 mg at 03/28/18 0400  . docusate (COLACE) 50 MG/5ML liquid 100 mg  100 mg Per Tube BID PRN Jacques Earthly T, MD      . ezetimibe (ZETIA) tablet 10 mg  10 mg Per Tube QHS Nila Nephew, MD   10 mg at 03/27/18 2102  . fentaNYL (SUBLIMAZE) bolus via infusion 25 mcg  25 mcg Intravenous Q1H PRN Milagros Loll, MD   25 mcg at 03/27/18 1319  . fentaNYL (SUBLIMAZE) injection 50 mcg  50 mcg Intravenous Q30 min PRN Jacques Earthly T, MD      . fentaNYL 2549mg in NS 2513m(1087mml) infusion-PREMIX  25-400 mcg/hr Intravenous Continuous KraJacques Earthly MD 7.5 mL/hr at 03/28/18 0700 75 mcg/hr at 03/28/18 0700  . gabapentin (NEURONTIN) 250 MG/5ML solution 1,200 mg  1,200 mg Oral QHS KraMilagros LollD   1,200 mg at 03/27/18 2103  . gabapentin (NEURONTIN) 250 MG/5ML solution  600 mg  600 mg Oral BID KraMilagros LollD   600 mg at 03/28/18  0559  . hydrALAZINE (APRESOLINE) injection 10 mg  10 mg Intravenous Q1H PRN Bensimhon, Shaune Pascal, MD      . insulin aspart (novoLOG) injection 2-6 Units  2-6 Units Subcutaneous Q4H Milagros Loll, MD   2 Units at 03/28/18 [phone]  . insulin glargine (LANTUS) injection 25 Units  25 Units Subcutaneous Q2200 Milagros Loll, MD   25 Units at 03/27/18 2102  . ipratropium-albuterol (DUONEB) 0.5-2.5 (3) MG/3ML nebulizer solution 3 mL  3 mL Nebulization Q4H PRN Milagros Loll, MD   3 mL at 03/27/18 1131  . [START ON 03/29/2018] levofloxacin (LEVAQUIN) IVPB 500 mg  500 mg Intravenous Q48H Rush Farmer, MD      . MEDLINE mouth rinse  15 mL Mouth Rinse 10 times per day Milagros Loll, MD   15 mL at 03/28/18 0559  . multivitamin (PROSIGHT) tablet 1 tablet  1 tablet Oral Daily Milagros Loll, MD   1 tablet at 03/27/18 1058  . nitroGLYCERIN (NITROSTAT) SL tablet 0.4 mg  0.4 mg Sublingual Q5 min PRN Jacques Earthly T, MD   0.4 mg at 03/25/18 2010  . norepinephrine (LEVOPHED) 69m in 2557mpremix infusion  0-40 mcg/min Intravenous Titrated Bensimhon, DaShaune PascalMD   Stopped at 03/26/18 2031  . pantoprazole (PROTONIX) injection 40 mg  40 mg Intravenous Q12H Bowser, Grace E, NP      . potassium PHOSPHATE 30 mmol in dextrose 5 % 500 mL infusion  30 mmol Intravenous Once BoCristal GenerousNP 85 mL/hr at 03/28/18 1040 30 mmol at 03/28/18 1040  . promethazine (PHENERGAN) injection 6.25 mg  6.25 mg Intravenous Q6H PRN BoCristal GenerousNP   6.25 mg at 03/28/18 0401  . rosuvastatin (CRESTOR) tablet 40 mg  40 mg Per NG tube QHS BaNila NephewMD   40 mg at 03/27/18 2102  . sodium chloride flush (NS) 0.9 % injection 10-40 mL  10-40 mL Intracatheter Q12H YaRush FarmerMD   10 mL at 03/28/18 1036  . sodium chloride flush (NS) 0.9 % injection 10-40 mL  10-40 mL Intracatheter PRN YaRush FarmerMD      . spironolactone (ALDACTONE) tablet 12.5 mg   12.5 mg Oral Daily McLarey DresserMD   12.5 mg at 03/27/18 1052  . torsemide (DEMADEX) tablet 100 mg  100 mg Oral BID SmGeorgiana ShoreNP      . vitamin B-12 (CYANOCOBALAMIN) tablet 1,000 mcg  1,000 mcg Oral Daily KrMilagros LollMD   1,000 mcg at 03/27/18 1053      Allergies  Allergen Reactions  . Amoxicillin Shortness Of Breath and Rash  . Brilinta [Ticagrelor] Shortness Of Breath  . Erythromycin Shortness Of Breath, Other (See Comments) and Hives    Trouble swallowing  . Flagyl [Metronidazole] Palpitations and Shortness Of Breath  . Penicillins Hives, Shortness Of Breath, Rash and Other (See Comments)    Has patient had a PCN reaction causing immediate rash, facial/tongue/throat swelling, SOB or lightheadedness with hypotension: Yes Has patient had a PCN reaction causing severe rash involving mucus membranes or skin necrosis: No Has patient had a PCN reaction that required hospitalization: Yes Has patient had a PCN reaction occurring within the last 10 years: No If all of the above answers are "NO", then may proceed with Cephalosporin use.   . Isosorbide Mononitrate [Isosorbide Nitrate] Other (See Comments)    Joint aches, muscles hurt, difficult to walk   . Jardiance [Empagliflozin] Other (See Comments)  Nausea, joint aches, muscles aches  . Metformin And Related Other (See Comments)    Stomach pain, cold sweats, joint pain, burred vision, dizziness  . Tape Other (See Comments)    Skin pulls off with certain types Plastic tape causes skin to rip if left on for long periods of time  . Heparin     Low risk HIT; Heparin Antibody = PENDING  . Erythromycin Base Rash  :  Family History  Problem Relation Age of Onset  . Heart disease Father        Heart Disease before age 26  . Diabetes Father   . Hyperlipidemia Father   . Hypertension Father   . Heart attack Father   . Deep vein thrombosis Father   . AAA (abdominal aortic aneurysm) Father   . Hypertension Mother     . Deep vein thrombosis Mother   . AAA (abdominal aortic aneurysm) Mother   . Cancer Sister        Ovarian  . Hypertension Sister   . Diabetes Paternal Grandmother   . Heart disease Paternal Uncle   . Diabetes Paternal Uncle   . Heart attack Paternal Grandfather 36       died of MI at 21  . Diabetes Paternal Aunt   . Diabetes Paternal Uncle   . Colon cancer Neg Hx   :  Social History   Socioeconomic History  . Marital status: Married    Spouse name: [name]   . Number of children: 1  . Years of education: 12+  . Highest education level: Not on file  Occupational History  . Occupation: retired  Scientific laboratory technician  . Financial resource strain: Not on file  . Food insecurity:    Worry: Not on file    Inability: Not on file  . Transportation needs:    Medical: Not on file    Non-medical: Not on file  Tobacco Use  . Smoking status: Never Smoker  . Smokeless tobacco: Never Used  Substance and Sexual Activity  . Alcohol use: Never    Frequency: Never  . Drug use: Never  . Sexual activity: Not Currently    Birth control/protection: Surgical    Comment: hysterectomy  Lifestyle  . Physical activity:    Days per week: Not on file    Minutes per session: Not on file  . Stress: Not on file  Relationships  . Social connections:    Talks on phone: Not on file    Gets together: Not on file    Attends religious service: Not on file    Active member of club or organization: Not on file    Attends meetings of clubs or organizations: Not on file    Relationship status: Not on file  . Intimate partner violence:    Fear of current or ex partner: Not on file    Emotionally abused: Not on file    Physically abused: Not on file    Forced sexual activity: Not on file  Other Topics Concern  . Not on file  Social History Narrative   Patient lives at home with husband [name].    Patient has 1 child.    Patient is not currently working.    Patient has 2 years of college.    Patient is  right handed.   Patient drinks caffeine occasionally.  :  Review of Systems  Constitutional: Reports fatigue and generalized weakness.  Denies weight loss.  Did not have any fevers or chills  prior to admission but is now experiencing intermittent fevers. HENT:   Negative for mouth sores, nosebleeds, sore throat and trouble swallowing.   Eyes: Negative for eye problems and icterus.  Respiratory: Reports cough and shortness of breath.  Denies hemoptysis. Cardiovascular: Negative for chest pain and leg swelling.  Gastrointestinal: Reports nausea and vomiting.  Denies constipation and diarrhea. Genitourinary: Negative for bladder incontinence, difficulty urinating, dysuria, frequency and hematuria.   Musculoskeletal: Negative for back pain, gait problem, neck pain and neck stiffness.  Skin: Negative for itching and rash.  Neurological: Reports intermittent headaches which have been ongoing.  Negative for dizziness, extremity weakness, gait problem, light-headedness and seizures.  Hematological: Bruises easily.  Reports that she had some blood-tinged sputum with suctioning.  Negative for adenopathy.   Psychiatric/Behavioral: Negative for confusion, depression and sleep disturbance. The patient is not nervous/anxious.    Exam: Patient Vitals for the past 24 hrs:  BP Temp Temp src Pulse Resp SpO2 Weight  03/28/18 1045 129/64 98.5 F (36.9 C) Oral 92 13 93 % -  03/28/18 1030 - - - - - 92 % -  03/28/18 1025 - - - - - 95 % -  03/28/18 0900 131/63 - - 86 13 96 % -  03/28/18 0813 - - - - - 96 % -  03/28/18 0802 - (!) 100.4 F (38 C) Oral - - - -  03/28/18 0600 (!) 110/55 - - 94 15 95 % -  03/28/18 0500 108/64 - - 93 15 94 % -  03/28/18 0454 - - - 94 18 95 % -  03/28/18 0400 (!) 142/50 - - 87 19 97 % -  03/28/18 0300 (!) 100/57 - - 93 17 95 % -  03/28/18 0200 (!) 82/51 - - 93 15 96 % -  03/28/18 0100 (!) 87/60 - - 100 15 95 % -  03/28/18 0015 (!) 104/46 - - 95 16 96 % -  03/28/18 0010 - - -  99 15 95 % -  03/28/18 0000 (!) 78/51 - - 97 16 95 % -  03/27/18 2300 (!) 78/56 99.3 F (37.4 C) Oral (!) 102 15 94 % -  03/27/18 2100 116/65 - - 97 17 96 % -  03/27/18 2032 - - - 92 15 99 % -  03/27/18 2000 100/64 99.8 F (37.7 C) Oral 94 18 96 % -  03/27/18 1900 123/87 - - 88 19 99 % -  03/27/18 1800 109/72 - - 93 14 98 % -  03/27/18 1700 (!) 136/37 - - 92 19 99 % -  03/27/18 1600 140/72 - - 98 15 98 % -  03/27/18 1555 - 99.5 F (37.5 C) Oral - - - -  03/27/18 1547 124/67 - - 95 15 97 % -  03/27/18 1500 124/67 - - 96 19 98 % -  03/27/18 1400 131/62 - - 97 13 97 % -  03/27/18 1300 (!) 141/83 - - 94 14 99 % -  03/27/18 1214 - 100 F (37.8 C) Oral - - - -  03/27/18 1200 (!) 127/105 99.8 F (37.7 C) Oral 94 14 97 % -  03/27/18 1131 - - - 84 19 98 % -    General:  well-nourished in no acute distress.  Eyes:  no scleral icterus.  ENT:  There were no oropharyngeal lesions.  Neck was without thyromegaly.  Lymphatics:  Negative cervical, supraclavicular or axillary adenopathy.  Respiratory: With wheezes and coarse rhonchi throughout.  Cardiovascular:  Regular  rate and rhythm, S1/S2, without murmur, rub or gallop.  Trace pedal edema bilaterally.  GI:  abdomen was soft, flat, nontender, nondistended, without organomegaly.  Musculoskeletal:  no spinal tenderness of palpation of vertebral spine.  Skin exam showed bruising to her bilateral shoulders, back, and near her IV site/blood draw sites.  Neuro exam was nonfocal.  Patient was alert and oriented.  Attention was good.   Language was appropriate.  Mood was normal without depression.  Speech was not pressured.  Thought content was not tangential.    LABS:  Lab Results  Component Value Date   WBC 13.3 (H) 03/28/2018   HGB 12.6 03/28/2018   HCT 40.0 03/28/2018   PLT <5 (LL) 03/28/2018   GLUCOSE 138 (H) 03/28/2018   CHOL 181 10/21/2017   TRIG 157 (H) 10/21/2017   HDL 42 10/21/2017   LDLDIRECT 156.7 02/06/2014   LDLCALC 108 (H) 10/21/2017     ALT 22 03/25/2018   AST 38 03/25/2018   NA 141 03/28/2018   K 3.1 (L) 03/28/2018   CL 88 (L) 03/28/2018   CREATININE 2.11 (H) 03/28/2018   BUN 30 (H) 03/28/2018   CO2 42 (H) 03/28/2018   INR 1.60 03/25/2018   HGBA1C 8.0 (H) 10/21/2017    Dg Chest Port 1 View  Result Date: 03/28/2018 CLINICAL DATA:  Intubation.  Respiratory failure. EXAM: PORTABLE CHEST 1 VIEW COMPARISON:  03/27/2018. FINDINGS: Right subclavian line stable position. Endotracheal tube and NG tube in stable position. Prior CABG. Cardiomegaly with diffuse bilateral pulmonary infiltrates/edema again noted. Similar findings noted on prior exam. No pleural effusion or pneumothorax. IMPRESSION: 1.  Lines and tubes in stable position. 2. Prior CABG. Cardiomegaly. Diffuse bilateral pulmonary infiltrates/edema again noted. No significant interim change. Electronically Signed   By: Marcello Moores  Register   On: 03/28/2018 09:05   Dg Chest Port 1 View  Result Date: 03/27/2018 CLINICAL DATA:  Endotracheal tube present.  Respiratory failure. EXAM: PORTABLE CHEST 1 VIEW COMPARISON:  One-view chest x-ray 03/25/2018. FINDINGS: The heart is enlarged. Endotracheal tube is in stable and satisfactory position. A new right PICC line is in place. The heart is enlarged. Bilateral interstitial and airspace disease is again noted. IMPRESSION: 1. Interval placement of right-sided PICC line. The tip is at the cavoatrial junction. 2. Stable cardiomegaly and diffuse interstitial and airspace disease, likely edema and CHF. Electronically Signed   By: San Morelle M.D.   On: 03/27/2018 08:37   Dg Chest Port 1 View  Result Date: 03/25/2018 CLINICAL DATA:  Post intubation EXAM: PORTABLE CHEST 1 VIEW COMPARISON:  03/25/2018 FINDINGS: Changes of CABG. Endotracheal tube is in place with the tip approximately 2.5 cm above the carina. NG tube enters the stomach. Cardiomegaly. Worsening bilateral airspace opacities, likely edema. No visible effusions or acute bony  abnormality. IMPRESSION: Cardiomegaly. Worsening bilateral airspace disease, likely edema/CHF. Electronically Signed   By: Rolm Baptise M.D.   On: 03/25/2018 20:59   Dg Chest Port 1 View  Result Date: 03/25/2018 CLINICAL DATA:  Cough, vomiting, wheezing EXAM: PORTABLE CHEST 1 VIEW COMPARISON:  07/07/2017 FINDINGS: Prior CABG. Cardiomegaly with vascular congestion and interstitial opacities, likely edema. No effusions or acute bony abnormality. IMPRESSION: Cardiomegaly, mild CHF. Electronically Signed   By: Rolm Baptise M.D.   On: 03/25/2018 17:33   [place] Ekg Site Rite  Result Date: 03/26/2018 If [facility] image not attached, placement could not be confirmed due to current cardiac rhythm.   ASSESSMENT AND PLAN:   This is a pleasant  73 year old female with  1.  Thrombocytopenia: Platelets have declined to less than 5000.  She has noted some blood tinged sputum but no other bleeding.  Currently off all anticoagulation. Platelets are currently hanging.  DIC panel and HIT panel are pending.  LDH has been ordered.  No schistocytes were noted on the smear review by the lab tech.  Will review peripheral smear.  Differentials include thrombocytopenia related to sepsis, DIC, ITP.  Less likely be TTP since no schistocytes were seen on the peripheral smear and no anemia.  Will begin treatment for ITP consisting of dexamethasone 40 mg daily x4 days along with IVIG. Transfuse platelets for a platelet count of less than 10,000 or active bleeding.  2.  Acute on chronic hypoxic respiratory failure, required mechanical ventilation, extubated today. 3.  Community-acquired pneumonia 4. Acute systolic CHF 5. HTN  6. Lactic acidosis   Thank you for this referral.  Mikey Bussing, DNP, AGPCNP-BC, AOCNP  Addendum   I have seen the patient, examined her. I agree with the assessment and and plan and have edited the notes.   73 year old Caucasian female, with multiple cardiac and pulmonary comorbidities, home  oxygen, presented with fever, hypertension urgency, and acute on chronic respite failure, required intubation.  Has been treated with empiric antibiotics for pneumonia, and diuretics for heart failure.  She was successfully extubated today.  Her blood counts was 104K on admission, previous normal, and dropped to less than 5K today, no significant clinical bleeding.  No anemia, mild leukocytosis with left shift.   I have reviewed her peripheral smear, which showed profound thrombocytopenia, occasional giant platelet, RBC morphology is normal, I still a few schistocytes during the entire exam.  Her reticulocyte count is normal, no evidence of hemolysis. previous CBC showed mild macrocytosis without anemia.   I think her thrombocytopenia is likely related to her acute illness, especially pneumonia and sepsis, hypertension urgency may also played a role. Although the degree of her thrombocytopenia is more severe than infection related thrombocytopenia.  TTP is unlikely, given the no evidence of hemolysis.  She has been on heparin infusion since she came in, however her thrombocytopenia presented on admission, and the timing is not consistent with HIT. I am not able to rule out ITP, due to the severe degree of thrombocytosis, and worsening thrombocytopenia when her infection seems to be controlled.  ITP could be triggered by infection. Due to her severe thrombocytopenia, and acute illness, I will give one dose IVIG 33m/kg tonight, and watch her blood counts. I will hold on steroids for now, due to the uncertainly of her diagnosis and underline infection. We will f/u daily.   YTruitt Merle 03/28/2018

## 2018-03-29 ENCOUNTER — Ambulatory Visit (HOSPITAL_COMMUNITY): Payer: Medicare Other

## 2018-03-29 ENCOUNTER — Inpatient Hospital Stay (HOSPITAL_COMMUNITY): Payer: Medicare Other

## 2018-03-29 DIAGNOSIS — I509 Heart failure, unspecified: Secondary | ICD-10-CM

## 2018-03-29 LAB — HEPARIN INDUCED PLATELET AB (HIT ANTIBODY): Heparin Induced Plt Ab: 0.11 OD (ref 0.000–0.400)

## 2018-03-29 LAB — CBC
HCT: 43.3 % (ref 36.0–46.0)
Hemoglobin: 13.1 g/dL (ref 12.0–15.0)
MCH: 29.8 pg (ref 26.0–34.0)
MCHC: 30.3 g/dL (ref 30.0–36.0)
MCV: 98.6 fL (ref 80.0–100.0)
Platelets: 54 10*3/uL — ABNORMAL LOW (ref 150–400)
RBC: 4.39 MIL/uL (ref 3.87–5.11)
RDW: 13.3 % (ref 11.5–15.5)
WBC: 13.9 10*3/uL — ABNORMAL HIGH (ref 4.0–10.5)
nRBC: 0 % (ref 0.0–0.2)

## 2018-03-29 LAB — BASIC METABOLIC PANEL
Anion gap: 14 (ref 5–15)
Anion gap: 13 (ref 5–15)
BUN: 38 mg/dL — ABNORMAL HIGH (ref 8–23)
BUN: 47 mg/dL — ABNORMAL HIGH (ref 8–23)
CO2: 45 mmol/L — ABNORMAL HIGH (ref 22–32)
CO2: 42 mmol/L — ABNORMAL HIGH (ref 22–32)
Calcium: 8.4 mg/dL — ABNORMAL LOW (ref 8.9–10.3)
Calcium: 9 mg/dL (ref 8.9–10.3)
Chloride: 80 mmol/L — ABNORMAL LOW (ref 98–111)
Chloride: 81 mmol/L — ABNORMAL LOW (ref 98–111)
Creatinine, Ser: 1.79 mg/dL — ABNORMAL HIGH (ref 0.44–1.00)
Creatinine, Ser: 1.86 mg/dL — ABNORMAL HIGH (ref 0.44–1.00)
GFR calc Af Amer: 33 mL/min — ABNORMAL LOW (ref >60)
GFR calc Af Amer: 31 mL/min — ABNORMAL LOW (ref >60)
GFR calc non Af Amer: 28 mL/min — ABNORMAL LOW (ref >60)
GFR calc non Af Amer: 27 mL/min — ABNORMAL LOW (ref >60)
Glucose, Bld: 283 mg/dL — ABNORMAL HIGH (ref 70–99)
Glucose, Bld: 339 mg/dL — ABNORMAL HIGH (ref 70–99)
Potassium: 2.9 mmol/L — ABNORMAL LOW (ref 3.5–5.1)
Potassium: 3.5 mmol/L (ref 3.5–5.1)
Sodium: 139 mmol/L (ref 135–145)
Sodium: 136 mmol/L (ref 135–145)

## 2018-03-29 LAB — GLUCOSE, CAPILLARY
Glucose-Capillary: 302 mg/dL — ABNORMAL HIGH (ref 70–99)
Glucose-Capillary: 297 mg/dL — ABNORMAL HIGH (ref 70–99)
Glucose-Capillary: 318 mg/dL — ABNORMAL HIGH (ref 70–99)
Glucose-Capillary: 274 mg/dL — ABNORMAL HIGH (ref 70–99)

## 2018-03-29 LAB — PREPARE PLATELET PHERESIS: Unit division: 0

## 2018-03-29 LAB — BPAM PLATELET PHERESIS
Blood Product Expiration Date: 202002252359
ISSUE DATE / TIME: 202002251043
Unit Type and Rh: 6200

## 2018-03-29 LAB — CULTURE, RESPIRATORY W GRAM STAIN: Culture: NORMAL

## 2018-03-29 LAB — COOXEMETRY PANEL
Carboxyhemoglobin: 1.7 % — ABNORMAL HIGH (ref 0.5–1.5)
Methemoglobin: 1.1 % (ref 0.0–1.5)
O2 Saturation: 81.8 %
Total hemoglobin: 13.4 g/dL (ref 12.0–16.0)

## 2018-03-29 LAB — MAGNESIUM: Magnesium: 2.1 mg/dL (ref 1.7–2.4)

## 2018-03-29 LAB — APTT: aPTT: 36 seconds (ref 24–36)

## 2018-03-29 LAB — PHOSPHORUS: Phosphorus: 4 mg/dL (ref 2.5–4.6)

## 2018-03-29 MED ORDER — POTASSIUM CHLORIDE 10 MEQ/100ML IV SOLN
10.0000 meq | INTRAVENOUS | Status: AC
  Administered 2018-03-29 (×6): 10 meq via INTRAVENOUS
  Filled 2018-03-29 (×6): qty 100

## 2018-03-29 MED ORDER — GABAPENTIN 300 MG PO CAPS
600.0000 mg | ORAL_CAPSULE | Freq: Two times a day (BID) | ORAL | Status: DC
  Administered 2018-03-29 – 2018-03-31 (×5): 600 mg via ORAL
  Filled 2018-03-29 (×5): qty 2

## 2018-03-29 MED ORDER — INSULIN ASPART 100 UNIT/ML ~~LOC~~ SOLN
0.0000 [IU] | Freq: Three times a day (TID) | SUBCUTANEOUS | Status: DC
  Administered 2018-03-29: 11 [IU] via SUBCUTANEOUS
  Administered 2018-03-30: 3 [IU] via SUBCUTANEOUS
  Administered 2018-03-30: 5 [IU] via SUBCUTANEOUS
  Administered 2018-03-30 – 2018-03-31 (×2): 3 [IU] via SUBCUTANEOUS
  Administered 2018-03-31: 2 [IU] via SUBCUTANEOUS
  Administered 2018-04-01: 3 [IU] via SUBCUTANEOUS

## 2018-03-29 MED ORDER — AMLODIPINE BESYLATE 10 MG PO TABS
10.0000 mg | ORAL_TABLET | Freq: Every day | ORAL | Status: DC
  Administered 2018-03-29 – 2018-03-31 (×3): 10 mg via ORAL
  Filled 2018-03-29 (×3): qty 1

## 2018-03-29 MED ORDER — TORSEMIDE 20 MG PO TABS: 80.0000 mg | ORAL_TABLET | Freq: Two times a day (BID) | ORAL | Status: DC

## 2018-03-29 MED ORDER — TORSEMIDE 20 MG PO TABS: 60.0000 mg | ORAL_TABLET | Freq: Two times a day (BID) | ORAL | Status: DC

## 2018-03-29 MED ORDER — INSULIN ASPART 100 UNIT/ML ~~LOC~~ SOLN
0.0000 [IU] | Freq: Every day | SUBCUTANEOUS | Status: DC
  Administered 2018-03-29: 3 [IU] via SUBCUTANEOUS
  Administered 2018-04-01: 4 [IU] via SUBCUTANEOUS

## 2018-03-29 MED ORDER — METOPROLOL SUCCINATE ER 50 MG PO TB24
50.0000 mg | ORAL_TABLET | Freq: Every day | ORAL | Status: DC
  Administered 2018-03-29 – 2018-03-30 (×2): 50 mg via ORAL
  Filled 2018-03-29 (×2): qty 1

## 2018-03-29 MED ORDER — TORSEMIDE 20 MG PO TABS
60.0000 mg | ORAL_TABLET | Freq: Two times a day (BID) | ORAL | Status: DC
  Administered 2018-03-29 – 2018-03-30 (×3): 60 mg via ORAL
  Filled 2018-03-29 (×3): qty 3

## 2018-03-29 MED ORDER — GABAPENTIN 400 MG PO CAPS
1200.0000 mg | ORAL_CAPSULE | Freq: Every day | ORAL | Status: DC
  Administered 2018-03-29 – 2018-03-30 (×2): 1200 mg via ORAL
  Filled 2018-03-29 (×2): qty 3

## 2018-03-29 MED ORDER — CLOPIDOGREL BISULFATE 75 MG PO TABS
75.0000 mg | ORAL_TABLET | Freq: Every day | ORAL | Status: DC
  Administered 2018-03-29 – 2018-04-10 (×12): 75 mg via ORAL
  Filled 2018-03-29 (×12): qty 1

## 2018-03-29 MED ORDER — IMMUNE GLOBULIN (HUMAN) 20 GM/200ML IV SOLN
60.0000 g | Freq: Once | INTRAVENOUS | Status: AC
  Administered 2018-03-29: 60 g via INTRAVENOUS
  Filled 2018-03-29: qty 600

## 2018-03-29 MED ORDER — SPIRONOLACTONE 25 MG PO TABS
25.0000 mg | ORAL_TABLET | Freq: Every day | ORAL | Status: DC
  Administered 2018-03-29 – 2018-03-31 (×3): 25 mg via ORAL
  Filled 2018-03-29 (×3): qty 1

## 2018-03-29 NOTE — Evaluation (Signed)
Clinical/Bedside Swallow Evaluation Patient Details  Name: [name] [name] [name]: [id number] Date of Birth: [birth year]  Today's Date: 03/29/2018 Time: SLP Start Time (ACUTE ONLY): 0830 SLP Stop Time (ACUTE ONLY): 0850 SLP Time Calculation (min) (ACUTE ONLY): 20 min  Past Medical History:  Past Medical History:  Diagnosis Date  . Abnormality of gait 05/14/2014  . Anemia    hx  . Anginal pain ([place])   . Anxiety   . Asthma   . Basal cell carcinoma 05/2014   "left shoulder"  . Bundle branch block, left    chronic/notes 07/18/2013  . CHF (congestive heart failure) ([place])   . Chronic bronchitis ([place])    "off and on all the time" (07/18/2013)  . Chronic insomnia 05/06/2015  . Chronic kidney disease    frequency, sees dr Jamal Maes every 4 to 6 months (01/16/2018)  . Chronic low back pain 08/24/2016  . Chronic lower back pain   . Claustrophobia   . Common migraine 05/14/2014  . Coronary artery disease   . Depression   . Dysrhythmia   . GERD (gastroesophageal reflux disease)   . H/O hiatal hernia   . Headache    "at least 2/month" (01/16/2018)  . Heart murmur   . Hyperlipidemia   . Hypertension   . Irregular heart beat   . Leg cramps    both legs at times  . Melanoma ([place]) 05/2014   "burned off BLE" (01/16/2018)  . Memory change 05/14/2014  . Migraine    "5-6/year"  (01/16/2018)  . Myocardial infarction ([place]) 04/1999, 02/2000, 01/2005; 2011; 2014  . Neuromuscular disorder ([place])    ?  . Obesity 01-2010  . Obstructive sleep apnea    "can't wear machine; I have claustrophobia" (01/16/2018)  . On home oxygen therapy    "2L at night and prn during daytime" (01/16/2018)  . Osteoarthritis    "knees and hands" (01/16/2018)  . Other and unspecified angina pectoris   . Peripheral vascular disease ([facility])    ? numbness, tingling arms and legs  . Pneumonia 2000's   "once"  . PONV (postoperative nausea and vomiting)   . Shortness of breath    with exertion  . Stroke ([place]) 03/22/12   right side brain; denies residual on 07/18/2013)  . Stroke [facility]) Oct. 2015   Affected pt.s balance  . Stroke [facility]) 10/2014   "affected my legs; fully recovered now"; still have sporatic memory issues from this one" (01/16/2018)  . Type II diabetes mellitus ([place])   . Ventral hernia    hx of  . Vomiting    persistent  . Vomiting blood    Past Surgical History:  Past Surgical History:  Procedure Laterality Date  . ANKLE FRACTURE SURGERY Left 1970's  . APPENDECTOMY  1970's   w/hysterectomy  . BASAL CELL CARCINOMA EXCISION Left 05/2014   "shoulder" (01/16/2018)  . CARDIAC CATHETERIZATION  10/10/2012   Dr [name].  Marland Kitchen CARDIAC CATHETERIZATION N/A 05/29/2015   Procedure: Right/Left Heart Cath and Coronary/Graft Angiography;  Surgeon: Larey Dresser, MD;  Location: [place] CV LAB;  Service: Cardiovascular;  Laterality: N/A;  . CAROTID ENDARTERECTOMY Left 03/2013  . CAROTID STENT INSERTION Left 03/20/2013   Procedure: CAROTID STENT INSERTION;  Surgeon: [name], MD;  Location: [facility] CATH LAB;  Service: Cardiovascular;  Laterality: Left;  internal carotid  . CEREBRAL ANGIOGRAM N/A 04/05/2011   Procedure: CEREBRAL ANGIOGRAM;  Surgeon: Angelia Mould, MD;  Location: [facility] CATH LAB;  Service: Cardiovascular;  Laterality:  N/A;  . CHOLECYSTECTOMY OPEN  2004  . CORONARY ANGIOPLASTY WITH STENT PLACEMENT  01,02,05,06,07,08,11; 04/24/2013   "I've probably got ~ 10 stents by now" (04/24/2013)  . CORONARY ANGIOPLASTY WITH STENT PLACEMENT  06/13/2013   "got 4 stents today" (06/13/2013)  . CORONARY ARTERY BYPASS GRAFT  1220/11   "CABG X5"  . CORONARY STENT INTERVENTION N/A 10/20/2017   Procedure: CORONARY STENT INTERVENTION;  Surgeon: Troy Sine, MD;  Location: [place] CV LAB;  Service: Cardiovascular;  Laterality: N/A;  . ESOPHAGOGASTRODUODENOSCOPY  08/03/2011   Procedure: ESOPHAGOGASTRODUODENOSCOPY (EGD);  Surgeon: Shann Medal, MD;  Location: [facility];  Service: General;  Laterality:  N/A;  . ESOPHAGOGASTRODUODENOSCOPY (EGD) WITH PROPOFOL N/A 03/11/2014   Procedure: ESOPHAGOGASTRODUODENOSCOPY (EGD) WITH PROPOFOL;  Surgeon: Lafayette Dragon, MD;  Location: [facility] ENDOSCOPY;  Service: Endoscopy;  Laterality: N/A;  . FRACTURE SURGERY    . gall stone removal  05/2003  . GASTRIC RESTRICTION SURGERY  1984   "stapeling"  . HERNIA REPAIR  2004   "in my stomach; had OR on it twice", wire mesh on 1 hernia  . LEFT HEART CATH AND CORS/GRAFTS ANGIOGRAPHY N/A 07/09/2016   Procedure: LEFT HEART CATH AND CORS/GRAFTS ANGIOGRAPHY;  Surgeon: Larey Dresser, MD;  Location: [place] [facility] LAB;  Service: Cardiovascular;  Laterality: N/A;  . OVARY SURGERY  1970's   "tumor removed"  . PERCUTANEOUS CORONARY STENT INTERVENTION (PCI-S) N/A 06/13/2013   Procedure: PERCUTANEOUS CORONARY STENT INTERVENTION (PCI-S);  Surgeon: [name], MD;  Location: [facility] CATH LAB;  Service: Cardiovascular;  Laterality: N/A;  . PERCUTANEOUS STENT INTERVENTION N/A 04/24/2013   Procedure: PERCUTANEOUS STENT INTERVENTION;  Surgeon: [name], MD;  Location: [facility] CATH LAB;  Service: Cardiovascular;  Laterality: N/A;  . RIGHT/LEFT HEART CATH AND CORONARY/GRAFT ANGIOGRAPHY N/A 10/20/2017   Procedure: RIGHT/LEFT HEART CATH AND CORONARY/GRAFT ANGIOGRAPHY;  Surgeon: Larey Dresser, MD;  Location: [place] CV LAB;  Service: Cardiovascular;  Laterality: N/A;  . ROOT CANAL  10/2000  . TIBIA FRACTURE SURGERY Right 1970's   rods and pins  . TOOTH EXTRACTION     "1 on the upper; wisdom tooth on the lower" (01/16/2018)  . TOTAL ABDOMINAL HYSTERECTOMY  70's   w/ appendectomy   HPI:  Pt is a 73 year old woman with history of asthma, OSA not on CPAP, chronic hypoxic respiratory failure on 2L at night, CAD s/p CABG and multiple PCI, HFpEF s/p CardioMEMS, AF on Eliquis, cerebrovascular disease, HTN, CKD, DM2, PAD presenting with cough. Found in ED to have fever, hypertensive urgency, and acute on chronic hypoxic respiratory  failure. Intubated 2/22-2/25.   Assessment / Plan / Recommendation Clinical Impression  Pt demonstrates no immediate signs of aspiration in setting of multiple risk factors. Pt is on a venti mask, with large NG tube in place, has a history of GERD and was extubated yesterday after 4 day intubation. Despite these risks pt presents with strong timely swallow, good awareness, minimally hoarse vocal quality and ability to consume 3 oz of water continuously without immediate cough. Will initiate regular diet and thin liquids with aspiration precautions discussed and demonstrated with RN and family present. Will sign off, if concerns arise please reorder.  SLP Visit Diagnosis: Dysphagia, unspecified (R13.10)    Aspiration Risk  Mild aspiration risk    Diet Recommendation Regular;Thin liquid   Liquid Administration via: Cup;Straw Medication Administration: Whole meds with puree Supervision: Patient able to self feed Compensations: Slow rate;Small sips/bites Postural Changes: Seated upright at 90  degrees    Other  Recommendations Oral Care Recommendations: Oral care BID   Follow up Recommendations None      Frequency and Duration            Prognosis        Swallow Study   General HPI: Pt is a 73 year old woman with history of asthma, OSA not on CPAP, chronic hypoxic respiratory failure on 2L at night, CAD s/p CABG and multiple PCI, HFpEF s/p CardioMEMS, AF on Eliquis, cerebrovascular disease, HTN, CKD, DM2, PAD presenting with cough. Found in ED to have fever, hypertensive urgency, and acute on chronic hypoxic respiratory failure. Intubated 2/22-2/25. Type of Study: Bedside Swallow Evaluation Previous Swallow Assessment: none Diet Prior to this Study: NPO Temperature Spikes Noted: No Respiratory Status: Venti-mask History of Recent Intubation: Yes Length of Intubations (days): 4 days Date extubated: 03/28/18 Behavior/Cognition: Alert;Cooperative;Pleasant mood;Requires cueing Oral  Cavity Assessment: Dry Oral Care Completed by SLP: No Oral Cavity - Dentition: Adequate natural dentition Vision: Functional for self-feeding Self-Feeding Abilities: Able to feed self;Needs assist Patient Positioning: Upright in bed Baseline Vocal Quality: Hoarse Volitional Cough: Strong;Congested Volitional Swallow: Able to elicit    Oral/Motor/Sensory Function Overall Oral Motor/Sensory Function: Within functional limits   Ice Chips Ice chips: Within functional limits Presentation: Spoon   Thin Liquid Thin Liquid: Within functional limits Presentation: Cup;Straw;Self Fed    Nectar Thick Nectar Thick Liquid: Not tested   Honey Thick Honey Thick Liquid: Not tested   Puree Puree: Within functional limits Presentation: Spoon   Solid     Solid: Within functional limits      Elester Apodaca, Katherene Ponto 03/29/2018,9:44 AM

## 2018-03-29 NOTE — Evaluation (Signed)
Physical Therapy Evaluation Patient Details Name: [name] [name] [name]: [id number] DOB: 1945-04-07 Today's Date: 03/29/2018   History of Present Illness  [name] is a 73 year old woman with history of asthma, OSA not on CPAP, chronic hypoxic respiratory failure on 2L at night, CAD s/p CABG and multiple PCI, HFpEF s/p CardioMEMS, AF on Eliquis, cerebrovascular disease, HTN, CKD, DM2, PAD presenting with cough found in the ED to have fever, hypertensive urgency, and acute on chronic hypoxic respiratory failure. intubated 2/22-2/25     Clinical Impression  Pt admitted with above diagnosis. Pt currently with functional limitations due to the deficits listed below (see PT Problem List). Patient with acute delirium today, hallucinating, history provided by family. Pt lives with husband in story home, limited to household ambulation very short distances before fatigue. Several falls lately. Family assists patient up the stairs where she mostly remains. Evaluation limited today due to CT pending to rule out CNS hemorrhage, transport arriving. Discussed with family goals of acute therapy and plan to see patient tomorrow for OOB mobility. Pt following simple cues. Discussed delirium precautions/receomdnations with family. Pt will benefit from skilled PT to increase their independence and safety with mobility to allow discharge to the venue listed below.       Follow Up Recommendations SNF(family hoping to progress to HHPT. limited eval today)    Equipment Recommendations  (TBD)    Recommendations for Other Services       Precautions / Restrictions Precautions Precautions: Fall Precaution Comments: check SpO2 Restrictions Weight Bearing Restrictions: No      Mobility  Bed Mobility               General bed mobility comments: transport arrived to take to CT, patient confused today deferred transfers  awaiting CT results.   Transfers                    Ambulation/Gait                 Stairs            Wheelchair Mobility    Modified Rankin (Stroke Patients Only)       Balance Overall balance assessment: (N/T)                                           Pertinent Vitals/Pain Pain Assessment: No/denies pain    Home Living Family/patient expects to be discharged to:: Private residence Living Arrangements: Spouse/significant other Available Help at Discharge: Family;Available PRN/intermittently;Available 24 hours/day Type of Home: House Home Access: Level entry     Home Layout: Two level Home Equipment: Walker - 4 wheels      Prior Function Level of Independence: Needs assistance   Gait / Transfers Assistance Needed: very limited tolerance to gait, 5 steps before rest at baseline.   ADL's / Homemaking Assistance Needed: I with ADls family reports 1 fall in tub receently.         Hand Dominance        Extremity/Trunk Assessment   Upper Extremity Assessment Upper Extremity Assessment: Overall WFL for tasks assessed    Lower Extremity Assessment Lower Extremity Assessment: Generalized weakness       Communication      Cognition Arousal/Alertness: Awake/alert Behavior During Therapy: WFL for tasks assessed/performed Overall Cognitive Status: Impaired/Different from baseline Area of Impairment: Awareness  Awareness: Intellectual   General Comments: follling cues can answer AO, at tiems approprioate at others hallucinating.       General Comments      Exercises     Assessment/Plan    PT Assessment Patient needs continued PT services  PT Problem List Decreased strength       PT Treatment Interventions DME instruction;Gait training;Stair training;Functional mobility training;Therapeutic exercise;Therapeutic activities;Balance training    PT Goals (Current goals can be found in the Care Plan section)  Acute Rehab PT Goals Patient Stated Goal: none  stated PT Goal Formulation: With patient Time For Goal Achievement: 04/12/18 Potential to Achieve Goals: Fair    Frequency Min 3X/week   Barriers to discharge        Co-evaluation               AM-PAC PT "6 Clicks" Mobility  Outcome Measure Help needed turning from your back to your side while in a flat bed without using bedrails?: A Little Help needed moving from lying on your back to sitting on the side of a flat bed without using bedrails?: A Little Help needed moving to and from a bed to a chair (including a wheelchair)?: A Lot Help needed standing up from a chair using your arms (e.g., wheelchair or bedside chair)?: A Lot Help needed to walk in hospital room?: Total Help needed climbing 3-5 steps with a railing? : Total 6 Click Score: 12    End of Session Equipment Utilized During Treatment: Oxygen Activity Tolerance: Other (comment)(heading to CT) Patient left: in bed;with family/visitor present Nurse Communication: Mobility status PT Visit Diagnosis: Unsteadiness on feet (R26.81)    Time: 1000-1030 PT Time Calculation (min) (ACUTE ONLY): 30 min   Charges:   PT Evaluation $PT Eval High Complexity: 1 High          Reinaldo Berber, PT, DPT Acute Rehabilitation Services Pager: [phone] Office: [phone]    Reinaldo Berber 03/29/2018, 11:13 AM

## 2018-03-29 NOTE — Progress Notes (Signed)
PROGRESS NOTE  [name] [name] BHA:[id number] DOB: 04/11/1945 DOA: 03/25/2018 PCP: Derinda Late, MD  HPI/Recap of past 30 hours: 73 year old woman with history of asthma, OSA not on CPAP, chronic hypoxic respiratory failure on 2L at night, CAD s/p CABG and multiple PCI, HFpEF s/p CardioMEMS, AF on Eliquis, cerebrovascular disease, HTN, CKD, DM2, PAD presenting with cough found in the ED to have fever, hypertensive urgency, and acute on chronic hypoxic respiratory failure. She was intubated in the ED and given IV Lasix, aztreonam and NTG gtt.  Transferred to [facility] service on 03/29/2018.  03/29/2018: Patient seen and examined with her daughter at bedside.  Daughter reports her mother still confused and having visual hallucinations.  Patient is alert and answering questions appropriately.  She has no new complaints.  Platelet count has improved post IVIG infusion.  CT head with no contrast revealed old right occipital lobe infarct and chronic small vessel disease without acute intracranial abnormality.  Assessment/Plan: Active Problems:   Acute respiratory failure with hypoxia (HCC)   Thrombocytopenia (HCC)   Community acquired pneumonia  Acute delirium, suspect multifactorial CT head unremarkable for any acute intracranial findings Reorient as indicated Continue to monitor  Suspected ITP Developed significant thrombocytopenia Oncology is following Platelet from 56 3K to 50 4K post IVIG infusion Heme oncology following Possible repeat of IVIG administration  Pulmonary edema secondary to acute on chronic CHF Independent review chest x-ray which revealed increase in pulmonary vascularity and cardiomegaly Continue with diuretics Maintain O2 saturation above 92% Patient was intubated on 03/25/2018 and extubated on 03/28/2018 Transferred to [facility] service on 9/73/5329  Acute systolic CHF Last 2D echo done on 03/26/2018 revealed LVEF 25 to 30% Severe hypokinesis, akinesis of the mid apical and  ventricular myocardium Previous 2D echo done on 02/13/2017 revealed LVEF 55 to 60% Continue cardiac medications Continue strict I's and O's and daily weight  Hypokalemia Potassium 2.9 Repleted with IV potassium supplement  AKI on CKD 3, improving Creatinine on presentation 2.05 Creatinine today 1.79 Continue to avoid nephrotoxic agents/dehydration/hypotension Repeat BMP in the morning  Paroxysmal A. fib Currently in normal sinus rhythm Off anticoagulation due to severe thrombocytopenia May consider restarting oral anticoagulation when platelet level has normalized  Suspected community-acquired pneumonia Presented with fever with T-max 102.6 and leukocytosis with WBC 13.5K Continue Levaquin  HyperLipidemia Continue home medications  Type 2 diabetes complicated with hyperglycemia Continue insulin Lantus 25 unit nightly Continue insulin sliding scale     Code Status: Full code  Family Communication: Daughter at bedside.  All questions answered to her satisfaction.  Disposition Plan: Indetermined, pending clinical improvement.   Consultants:  Cardiology  Medical oncology  Procedures:     Antimicrobials:  Levaquin  DVT prophylaxis:  SCDs   Objective: Vitals:   03/29/18 0555 03/29/18 0822 03/29/18 0826 03/29/18 1329  BP: (!) 187/60  (!) 189/95 (!) 153/85  Pulse: 93  (!) 105 84  Resp: '14  16 19  ' Temp:   99.1 F (37.3 C) 97.6 F (36.4 C)  TempSrc:   Oral Oral  SpO2: 93% 95% 96% 99%  Weight: 120.8 kg     Height:        Intake/Output Summary (Last 24 hours) at 03/29/2018 1335 Last data filed at 03/29/2018 0659 Gross per 24 hour  Intake 1442.54 ml  Output 3200 ml  Net -1757.46 ml   Filed Weights   03/27/18 0500 03/29/18 0555  Weight: 60.4 kg 120.8 kg    Exam:  . General: 73 y.o. year-old  female well developed well nourished in no acute distress.  Alert and oriented x3. . Cardiovascular: Regular rate and rhythm with no rubs or gallops.  No  thyromegaly or JVD noted.   Marland Kitchen Respiratory: Clear to auscultation with no wheezes or rales. Good inspiratory effort. . Abdomen: Soft nontender nondistended with normal bowel sounds x4 quadrants. . Musculoskeletal: Trace lower extremity edema. 2/4 pulses in all 4 extremities. Marland Kitchen Psychiatry: Mood is appropriate for condition and setting   Data Reviewed: CBC: Recent Labs  Lab 03/25/18 1630  03/27/18 0434  03/28/18 0455 03/28/18 0512 03/28/18 0820 03/28/18 1046 03/28/18 1500 03/29/18 0518  WBC 9.4   < > 13.5*  --   --  12.6* 13.3*  --  16.0* 13.9*  NEUTROABS 8.1*  --   --   --   --   --   --   --   --   --   HGB 13.4   < > 12.8   < > 12.9 12.5 12.6  --  12.3 13.1  HCT 43.9   < > 42.3   < > 38.0 41.1 40.0  --  40.8 43.3  MCV 101.4*   < > 100.0  --   --  99.5 99.5  --  100.5* 98.6  PLT 104*   < > 49*  --   --  <5* <5* <5* 23* 54*   < > = values in this interval not displayed.   Basic Metabolic Panel: Recent Labs  Lab 03/25/18 1630  03/26/18 0522 03/27/18 0434 03/27/18 0637 03/28/18 0455 03/28/18 0512 03/28/18 1535 03/29/18 0518  NA 138   < > 141 141 138 139 141 140 139  K 4.1   < > 4.9 3.3* 3.1* 3.2* 3.1* 3.6 2.9*  CL 101  --  101 92*  --   --  88* 85* 80*  CO2 26  --  22 35*  --   --  42* 40* 45*  GLUCOSE 166*  --  135* 120*  --   --  138* 247* 283*  BUN 15  --  22 23  --   --  30* 31* 38*  CREATININE 1.34*  --  1.68* 1.71*  --   --  2.11* 2.05* 1.79*  CALCIUM 9.1  --  8.5* 8.2*  --   --  8.3* 8.6* 8.4*  MG 2.0  --  2.1 1.8  --   --  2.1  --  2.1  PHOS 2.8  --  3.6 2.9  --   --  2.1*  --  4.0   < > = values in this interval not displayed.   GFR: Estimated Creatinine Clearance: 36.5 mL/min (A) (by C-G formula based on SCr of 1.79 mg/dL (H)). Liver Function Tests: Recent Labs  Lab 03/25/18 1630  AST 38  ALT 22  ALKPHOS 72  BILITOT 1.5*  PROT 7.3  ALBUMIN 3.9   Recent Labs  Lab 03/25/18 1630  LIPASE 19   No results for input(s): AMMONIA in the last 168  hours. Coagulation Profile: Recent Labs  Lab 03/25/18 1630 03/28/18 1046  INR 1.60 1.5*   Cardiac Enzymes: Recent Labs  Lab 03/25/18 1630 03/25/18 2230 03/26/18 0522  TROPONINI 0.05* 0.40* 0.48*   BNP (last 3 results) No results for input(s): PROBNP in the last 8760 hours. HbA1C: No results for input(s): HGBA1C in the last 72 hours. CBG: Recent Labs  Lab 03/28/18 0803 03/28/18 1120 03/28/18 2210 03/29/18 0722 03/29/18 [place]  142* 150* 289* 302* 297*   Lipid Profile: No results for input(s): CHOL, HDL, LDLCALC, TRIG, CHOLHDL, LDLDIRECT in the last 72 hours. Thyroid Function Tests: No results for input(s): TSH, T4TOTAL, FREET4, T3FREE, THYROIDAB in the last 72 hours. Anemia Panel: Recent Labs    03/28/18 1355  RETICCTPCT 1.1   Urine analysis:    Component Value Date/Time   COLORURINE YELLOW 03/25/2018 [place] 03/25/2018 1820   LABSPEC 1.017 03/25/2018 1820   PHURINE 5.0 03/25/2018 1820   GLUCOSEU 50 (A) 03/25/2018 1820   HGBUR MODERATE (A) 03/25/2018 1820   BILIRUBINUR NEGATIVE 03/25/2018 1820   KETONESUR 20 (A) 03/25/2018 1820   PROTEINUR >=300 (A) 03/25/2018 1820   UROBILINOGEN 1.0 04/16/2011 0954   NITRITE NEGATIVE 03/25/2018 1820   LEUKOCYTESUR NEGATIVE 03/25/2018 1820   Sepsis Labs: '@LABRCNTIP' (procalcitonin:4,lacticidven:4)  ) Recent Results (from the past 240 hour(s))  Urine culture     Status: Abnormal   Collection Time: 03/25/18  4:18 PM  Result Value Ref Range Status   Specimen Description URINE, RANDOM  Final   Special Requests   Final    NONE Performed at [facility], [place] [address] [place], [place] [address]    Culture 10,000 COLONIES/mL ESCHERICHIA COLI (A)  Final   Report Status 03/27/2018 FINAL  Final   Organism ID, Bacteria ESCHERICHIA COLI (A)  Final      Susceptibility   Escherichia coli - MIC*    AMPICILLIN <=2 SENSITIVE Sensitive     CEFAZOLIN <=4 SENSITIVE Sensitive     CEFTRIAXONE <=1  SENSITIVE Sensitive     CIPROFLOXACIN <=0.25 SENSITIVE Sensitive     GENTAMICIN <=1 SENSITIVE Sensitive     IMIPENEM <=0.25 SENSITIVE Sensitive     NITROFURANTOIN <=16 SENSITIVE Sensitive     TRIMETH/SULFA <=20 SENSITIVE Sensitive     AMPICILLIN/SULBACTAM <=2 SENSITIVE Sensitive     PIP/TAZO <=4 SENSITIVE Sensitive     Extended ESBL NEGATIVE Sensitive     * 10,000 COLONIES/mL ESCHERICHIA COLI  Culture, blood (routine x 2)     Status: None (Preliminary result)   Collection Time: 03/25/18  4:18 PM  Result Value Ref Range Status   Specimen Description BLOOD RIGHT ANTECUBITAL  Final   Special Requests   Final    BOTTLES DRAWN AEROBIC AND ANAEROBIC Blood Culture results may not be optimal due to an inadequate volume of blood received in culture bottles   Culture   Final    NO GROWTH 4 DAYS Performed at [place] [address], [place], [place] [address]    Report Status PENDING  Incomplete  Culture, blood (routine x 2)     Status: None (Preliminary result)   Collection Time: 03/25/18  4:23 PM  Result Value Ref Range Status   Specimen Description BLOOD LEFT ANTECUBITAL  Final   Special Requests   Final    AEROBIC BOTTLE ONLY Blood Culture results may not be optimal due to an inadequate volume of blood received in culture bottles   Culture   Final    NO GROWTH 4 DAYS Performed at [facility], [place] [address], [place], [place] [address]    Report Status PENDING  Incomplete  MRSA PCR Screening     Status: None   Collection Time: 03/26/18 12:00 AM  Result Value Ref Range Status   MRSA by PCR NEGATIVE NEGATIVE Final    Comment:        The GeneXpert MRSA Assay (FDA approved  for NASAL specimens only), is one component of a comprehensive MRSA colonization surveillance program. It is not intended to diagnose MRSA infection nor to guide or monitor treatment for MRSA infections. Performed at [facility], [place] [address] [place], [place] [address]   Culture,  respiratory (non-expectorated)     Status: None   Collection Time: 03/27/18  8:46 AM  Result Value Ref Range Status   Specimen Description TRACHEAL ASPIRATE  Final   Special Requests NONE  Final   Gram Stain   Final    ABUNDANT WBC PRESENT, PREDOMINANTLY PMN NO ORGANISMS SEEN    Culture   Final    FEW Consistent with normal respiratory flora. Performed at [facility], [place] [address] [place], [place] [address]    Report Status 03/29/2018 FINAL  Final      Studies: Dg Chest Port 1 View  Result Date: 03/29/2018 CLINICAL DATA:  Shortness of breath, respiratory EXAM: PORTABLE CHEST 1 VIEW COMPARISON:  03/28/2018 FINDINGS: Endotracheal tube not visualized. Bilateral diffuse interstitial and alveolar airspace opacities. No significant pleural effusion. No pneumothorax. Stable cardiomegaly. Prior CABG. Nasogastric tube coursing below the diaphragm projecting over the stomach. Right-sided PICC line with the tip projecting over the SVC. No acute osseous abnormality. IMPRESSION: 1. Bilateral interstitial and alveolar airspace opacities concerning for pulmonary edema versus multilobar pneumonia. Electronically Signed   By: Kathreen Devoid   On: 03/29/2018 10:00    Scheduled Meds: . amiodarone  100 mg Per NG tube Daily  . amLODipine  10 mg Oral Daily  . arformoterol  15 mcg Nebulization BID  . budesonide (PULMICORT) nebulizer solution  0.25 mg Nebulization BID  . buPROPion  75 mg Oral BID  . chlorhexidine gluconate (MEDLINE KIT)  15 mL Mouth Rinse BID  . Chlorhexidine Gluconate Cloth  6 each Topical Daily  . cholecalciferol  2,000 Units Oral Q breakfast  . ezetimibe  10 mg Per Tube QHS  . gabapentin  1,200 mg Oral QHS  . gabapentin  600 mg Oral BID  . insulin aspart  0-15 Units Subcutaneous TID WC  . insulin aspart  0-5 Units Subcutaneous QHS  . insulin glargine  25 Units Subcutaneous Q2200  . metoprolol succinate  50 mg Oral Daily  . multivitamin  1 tablet Oral Daily  . pantoprazole  (PROTONIX) IV  40 mg Intravenous Q12H  . rosuvastatin  40 mg Per NG tube QHS  . sodium chloride flush  10-40 mL Intracatheter Q12H  . spironolactone  25 mg Oral Daily  . torsemide  60 mg Oral BID  . vitamin B-12  1,000 mcg Oral Daily    Continuous Infusions: . sodium chloride Stopped (03/29/18 0629)  . levofloxacin (LEVAQUIN) IV       LOS: 4 days     Kayleen Memos, MD [facility] Pager [phone]  If 7PM-7AM, please contact night-coverage [email or web address] Password [email or web address] 03/29/2018, 1:35 PM

## 2018-03-29 NOTE — Evaluation (Signed)
Occupational Therapy Evaluation Patient Details Name: [name] [name] [name]: [id number] DOB: [birth year] Today's Date: 03/29/2018    History of Present Illness [name] [name] is a 73 year old woman with history of asthma, OSA not on CPAP, chronic hypoxic respiratory failure on 2L at night, CAD s/p CABG and multiple PCI, HFpEF s/p CardioMEMS, AF on Eliquis, cerebrovascular disease, HTN, CKD, DM2, PAD presenting with cough found in the ED to have fever, hypertensive urgency, and acute on chronic hypoxic respiratory failure. intubated 2/22-2/25   Clinical Impression   Patient presenting with decreased I in self care, balance, functional mobility/transfers, self care, strength, endurance, safe awareness, and cognition.  Patient recently acquired rollator at home but only ambulating 5' PTA. She lives with husband and spends most of time on second floor where bedroom is located. Husband verbalized plans of getting stair glide in future.Patient currently functioning at min - max A. She requires mod - max cuing to attend to tasks as she is internally and externally distracted as well as seeing hallucinations. Patient will benefit from acute OT to increase overall independence in the areas of ADLs, functional mobility, and safety awareness  in order to safely discharge to next venue of care.If pt clears cognitively she could possibly got home with family and HHOT pending progress.      Follow Up Recommendations  SNF;Supervision/Assistance - 24 hour    Equipment Recommendations  Other (comment)(defer to next venue of care)    Recommendations for Other Services       Precautions / Restrictions Precautions Precautions: Fall Precaution Comments: check SpO2 Restrictions Weight Bearing Restrictions: No      Mobility Bed Mobility Overal bed mobility: Needs Assistance Bed Mobility: Rolling;Supine to Sit Rolling: Min assist   Supine to sit: Min assist     General bed mobility comments: min verbal cuing  for technique and min A for trunk elevation  Transfers Overall transfer level: Needs assistance Equipment used: Rolling walker (2 wheeled) Transfers: Sit to/from [facility] Sit to Stand: Mod assist Stand pivot transfers: Min assist       General transfer comment: mod lifting to stand from bed and taking 4-5 steps to recliner chair with min A and min - mod cuing for technique and attention    Balance Overall balance assessment: Needs assistance Sitting-balance support: Feet supported Sitting balance-Leahy Scale: Fair Sitting balance - Comments: close supervision   Standing balance support: During functional activity Standing balance-Leahy Scale: Poor Standing balance comment: reliance on RW and UE support         ADL either performed or assessed with clinical judgement   ADL Overall ADL's : Needs assistance/impaired     Grooming: Wash/dry hands;Wash/dry face;Oral care;Set up;Sitting   Upper Body Bathing: Minimal assistance;Sitting   Lower Body Bathing: Maximal assistance;Sit to/from stand   Upper Body Dressing : Sitting;Minimal assistance   Lower Body Dressing: Moderate assistance;Maximal assistance;Sit to/from stand   Toilet Transfer: Moderate assistance   Toileting- Clothing Manipulation and Hygiene: Maximal assistance               Vision Baseline Vision/History: Wears glasses Wears Glasses: Reading only Patient Visual Report: No change from baseline              Pertinent Vitals/Pain Pain Assessment: No/denies pain     Hand Dominance Right   Extremity/Trunk Assessment Upper Extremity Assessment Upper Extremity Assessment: Generalized weakness   Lower Extremity Assessment Lower Extremity Assessment: Generalized weakness       Communication Communication Communication: No  difficulties   Cognition Arousal/Alertness: Awake/alert Behavior During Therapy: Impulsive Overall Cognitive Status: Impaired/Different from  baseline Area of Impairment: Awareness;Safety/judgement;Following commands;Attention       Current Attention Level: Sustained   Following Commands: Follows one step commands inconsistently Safety/Judgement: Decreased awareness of safety;Decreased awareness of deficits Awareness: Intellectual   General Comments: continues to be hallucinating in the room and having other conversations              [name] [name] expects to be discharged to:: Private residence Living Arrangements: Spouse/significant other Available Help at Discharge: Family;Available PRN/intermittently;Available 24 hours/day Type of Home: House Home Access: Level entry     Home Layout: Two level;Bed/bath upstairs   Alternate Level Stairs-Rails: Can reach both Bathroom Shower/Tub: Tub/shower unit   Bathroom Toilet: Standard Bathroom Accessibility: No   Home Equipment: Walker - 4 wheels          Prior Functioning/Environment Level of Independence: Needs assistance  Gait / Transfers Assistance Needed: very limited tolerance to gait, 5 steps before rest at baseline.  ADL's / Homemaking Assistance Needed: I with ADls family reports 1 fall in tub recently. Communication / Swallowing Assistance Needed: patient stays on 2nd floor of home mostly, family all assist her up the stairs. was walking in house without assistive device, they just got a [employer] but have not tried it yet.           OT Problem List: Decreased strength;Decreased knowledge of use of DME or AE;Decreased activity tolerance;Decreased cognition;Cardiopulmonary status limiting activity;Impaired balance (sitting and/or standing);Decreased safety awareness      OT Treatment/Interventions: Self-care/ADL training;Balance training;Therapeutic exercise;Neuromuscular education;Therapeutic activities;Energy conservation;Cognitive remediation/compensation;Manual therapy;Patient/family education;Visual/perceptual remediation/compensation;DME  and/or AE instruction    OT Goals(Current goals can be found in the care plan section) Acute Rehab OT Goals Patient Stated Goal: none stated ADL Goals Pt Will Perform Grooming: with supervision Pt Will Perform Upper Body Bathing: with supervision Pt Will Perform Lower Body Bathing: with supervision Pt Will Perform Upper Body Dressing: with supervision Pt Will Perform Lower Body Dressing: with supervision Pt Will Transfer to Toilet: with supervision Pt Will Perform Toileting - Clothing Manipulation and hygiene: with supervision  OT Frequency: Min 2X/week   Barriers to D/C: Other (comment)  none known at this time          AM-PAC OT "6 Clicks" Daily Activity     Outcome Measure Help from another person eating meals?: A Little Help from another person taking care of personal grooming?: A Little Help from another person toileting, which includes using toliet, bedpan, or urinal?: A Lot Help from another person bathing (including washing, rinsing, drying)?: A Lot Help from another person to put on and taking off regular upper body clothing?: A Little Help from another person to put on and taking off regular lower body clothing?: A Lot 6 Click Score: 15   End of Session Equipment Utilized During Treatment: Rolling walker;Oxygen Nurse Communication: Mobility status;Precautions  Activity Tolerance: Patient limited by fatigue Patient left: in chair;with call bell/phone within reach;with chair alarm set;with family/visitor present  OT Visit Diagnosis: History of falling (Z91.81);Muscle weakness (generalized) (M62.81)                Time: 1884-1660 OT Time Calculation (min): 30 min Charges:  OT General Charges $OT Visit: 1 Visit OT Evaluation $OT Eval Moderate Complexity: 1 Mod OT Treatments $Therapeutic Activity: 8-22 mins  Eldora Napp, Latanya Presser, MS, OTR/L 03/29/2018, 3:33 PM

## 2018-03-29 NOTE — Progress Notes (Addendum)
[facility] INPATIENT PROGRESS NOTE  SUBJECTIVE: [name] [name] 73 y.o. female seen for thrombocytopenia. Having visual hallucinations overnight. Patient is lucid and able to answer questions appropriately, but has episodes of hallucinations. Due to have a CT scan of her head shortly. Intermittent headaches, but better now. Denies bleeding. Afebrile. Still on O2 via facemask.    ALLERGIES:  is allergic to amoxicillin; brilinta [ticagrelor]; erythromycin; flagyl [metronidazole]; penicillins; isosorbide mononitrate [isosorbide nitrate]; jardiance [empagliflozin]; metformin and related; tape; heparin; and erythromycin base.  MEDICATIONS:  Current Facility-Administered Medications  Medication Dose Route Frequency Provider Last Rate Last Dose  . 0.9 %  sodium chloride infusion   Intravenous PRN Rush Farmer, MD   Stopped at 03/29/18 [phone]  . acetaminophen (TYLENOL) solution 1,000 mg  1,000 mg Per Tube Q6H PRN Rush Farmer, MD   1,000 mg at 03/27/18 0825  . ALPRAZolam Duanne Moron) tablet 0.5 mg  0.5 mg Oral TID PRN Milagros Loll, MD      . amiodarone (PACERONE) tablet 100 mg  100 mg Per NG tube Daily Nila Nephew, MD   100 mg at 03/29/18 0901  . amLODipine (NORVASC) tablet 10 mg  10 mg Oral Daily Larey Dresser, MD   10 mg at 03/29/18 0901  . arformoterol (BROVANA) nebulizer solution 15 mcg  15 mcg Nebulization BID Milagros Loll, MD   15 mcg at 03/29/18 1030  . bisacodyl (DULCOLAX) suppository 10 mg  10 mg Rectal Daily PRN Jacques Earthly T, MD      . budesonide (PULMICORT) nebulizer solution 0.25 mg  0.25 mg Nebulization BID Jacques Earthly T, MD   0.25 mg at 03/29/18 1314  . buPROPion [facility]) tablet 75 mg  75 mg Oral BID Milagros Loll, MD   75 mg at 03/29/18 0901  . chlorhexidine gluconate (MEDLINE KIT) (PERIDEX) 0.12 % solution 15 mL  15 mL Mouth Rinse BID Milagros Loll, MD   15 mL at 03/28/18 0847  . Chlorhexidine Gluconate Cloth 2 % PADS 6 each  6 each  Topical Daily Rush Farmer, MD   6 each at 03/29/18 [phone]  . cholecalciferol (VITAMIN D3) tablet 2,000 Units  2,000 Units Oral Q breakfast Milagros Loll, MD   2,000 Units at 03/29/18 [phone]  . diphenhydrAMINE (BENADRYL) injection 25 mg  25 mg Intravenous Q6H PRN Cristal Generous, NP   25 mg at 03/28/18 2307  . docusate (COLACE) 50 MG/5ML liquid 100 mg  100 mg Per Tube BID PRN Jacques Earthly T, MD      . ezetimibe (ZETIA) tablet 10 mg  10 mg Per Tube Cathey Endow, MD   10 mg at 03/28/18 2211  . gabapentin (NEURONTIN) 250 MG/5ML solution 1,200 mg  1,200 mg Oral QHS Milagros Loll, MD   1,200 mg at 03/28/18 2259  . gabapentin (NEURONTIN) 250 MG/5ML solution 600 mg  600 mg Oral BID Jacques Earthly T, MD   600 mg at 03/29/18 7282  . hydrALAZINE (APRESOLINE) injection 10 mg  10 mg Intravenous Q1H PRN Bensimhon, Shaune Pascal, MD   10 mg at 03/29/18 0601  . insulin glargine (LANTUS) injection 25 Units  25 Units Subcutaneous Q2200 Milagros Loll, MD   25 Units at 03/28/18 2258  . ipratropium-albuterol (DUONEB) 0.5-2.5 (3) MG/3ML nebulizer solution 3 mL  3 mL Nebulization Q4H PRN Milagros Loll, MD   3 mL at 03/27/18 1131  . levofloxacin (LEVAQUIN) IVPB 500 mg  500 mg Intravenous  Q48H Rush Farmer, MD      . metoprolol succinate (TOPROL-XL) 24 hr tablet 50 mg  50 mg Oral Daily Larey Dresser, MD   50 mg at 03/29/18 0900  . multivitamin (PROSIGHT) tablet 1 tablet  1 tablet Oral Daily Milagros Loll, MD   1 tablet at 03/29/18 0901  . nitroGLYCERIN (NITROSTAT) SL tablet 0.4 mg  0.4 mg Sublingual Q5 min PRN Milagros Loll, MD   0.4 mg at 03/25/18 2010  . pantoprazole (PROTONIX) injection 40 mg  40 mg Intravenous Q12H Bowser, Laurel Dimmer, NP   40 mg at 03/29/18 0855  . potassium chloride 10 mEq in 100 mL IVPB  10 mEq Intravenous Q1 Hr x 6 Irene Pap N, DO 100 mL/hr at 03/29/18 0851 10 mEq at 03/29/18 0851  . promethazine (PHENERGAN) injection 6.25 mg  6.25 mg Intravenous Q6H PRN Cristal Generous, NP   6.25 mg at 03/28/18 0401  . rosuvastatin (CRESTOR) tablet 40 mg  40 mg Per NG tube QHS Nila Nephew, MD   40 mg at 03/28/18 2211  . sodium chloride flush (NS) 0.9 % injection 10-40 mL  10-40 mL Intracatheter Q12H Rush Farmer, MD   10 mL at 03/29/18 0906  . sodium chloride flush (NS) 0.9 % injection 10-40 mL  10-40 mL Intracatheter PRN Rush Farmer, MD      . spironolactone (ALDACTONE) tablet 25 mg  25 mg Oral Daily Larey Dresser, MD   25 mg at 03/29/18 0900  . torsemide (DEMADEX) tablet 60 mg  60 mg Oral BID Larey Dresser, MD      . vitamin B-12 (CYANOCOBALAMIN) tablet 1,000 mcg  1,000 mcg Oral Daily Milagros Loll, MD   1,000 mcg at 03/29/18 0901    REVIEW OF SYSTEMS:   Review of Systems  Review of Systems  Constitutional: Positive for malaise/fatigue. Negative for chills and fever.  HENT: Negative.   Eyes: Negative.   Respiratory: Positive for cough and shortness of breath.   Cardiovascular: Negative.   Gastrointestinal: Negative.   Genitourinary: Negative.   Musculoskeletal: Negative.   Skin: Negative.   Neurological: Positive for headaches. Negative for dizziness, focal weakness, seizures, loss of consciousness and weakness.  Endo/Heme/Allergies: Bruises/bleeds easily.       Bruises easily. No bleeding.   Psychiatric/Behavioral: Positive for hallucinations. The patient is not nervous/anxious and does not have insomnia.       PHYSICAL EXAMINATION:  Vital signs in last 24 hours: Temp:  [97.6 F (36.4 C)-99.1 F (37.3 C)] 99.1 F (37.3 C) (02/26 0826) Pulse Rate:  [88-105] 105 (02/26 0826) Resp:  [12-21] 16 (02/26 0826) BP: (112-189)/(49-95) 189/95 (02/26 0826) SpO2:  [89 %-96 %] 96 % (02/26 0826) FiO2 (%):  [40 %-50 %] 50 % (02/26 0822) Weight:  [266 lb 5.1 oz (120.8 kg)] 266 lb 5.1 oz (120.8 kg) (02/26 0555) Weight change:  Last BM Date: 03/25/18  Intake/Output from previous day: 02/25 0701 - 02/26 0700 In: 1751 [I.V.:919; NG/GT:105; IV  Piggyback:677] Out: 4450 [Urine:3950; Emesis/NG output:500] General: Alert, awake without distress. Intermittent hallucinations. Able to answer my questions appropriately.  Head: Normocephalic atraumatic. Mouth: mucous membranes moist, pharynx normal without lesions Eyes: No scleral icterus.  Pupils are equal and round reactive to light. Resp: Wheezing. Coarse throughout. Cardio: regular rate and rhythm, S1, S2 normal, no murmur, click, rub or gallop. Trace bilateral pedal edema.  GI: soft, non-tender; bowel sounds normal; no masses,  no organomegaly Musculoskeletal:  No joint deformity or effusion. Neurological: No motor, sensory deficits.  Intact deep tendon reflexes. Skin: No rashes or lesions.  Portacath/PICC-without erythema  LABORATORY DATA: Lab Results  Component Value Date   WBC 13.9 (H) 03/29/2018   HGB 13.1 03/29/2018   HCT 43.3 03/29/2018   MCV 98.6 03/29/2018   PLT 54 (L) 03/29/2018    CMP Latest Ref Rng & Units 03/29/2018 03/28/2018 03/28/2018  Glucose 70 - 99 mg/dL 283(H) 247(H) 138(H)  BUN 8 - 23 mg/dL 38(H) 31(H) 30(H)  Creatinine 0.44 - 1.00 mg/dL 1.79(H) 2.05(H) 2.11(H)  Sodium 135 - 145 mmol/L 139 140 141  Potassium 3.5 - 5.1 mmol/L 2.9(L) 3.6 3.1(L)  Chloride 98 - 111 mmol/L 80(L) 85(L) 88(L)  CO2 22 - 32 mmol/L 45(H) 40(H) 42(H)  Calcium 8.9 - 10.3 mg/dL 8.4(L) 8.6(L) 8.3(L)  Total Protein 6.5 - 8.1 g/dL - - -  Total Bilirubin 0.3 - 1.2 mg/dL - - -  Alkaline Phos 38 - 126 U/L - - -  AST 15 - 41 U/L - - -  ALT 0 - 44 U/L - - -    RADIOGRAPHIC STUDIES:  Dg Chest Port 1 View  Result Date: 03/28/2018 CLINICAL DATA:  Intubation.  Respiratory failure. EXAM: PORTABLE CHEST 1 VIEW COMPARISON:  03/27/2018. FINDINGS: Right subclavian line stable position. Endotracheal tube and NG tube in stable position. Prior CABG. Cardiomegaly with diffuse bilateral pulmonary infiltrates/edema again noted. Similar findings noted on prior exam. No pleural effusion or pneumothorax.  IMPRESSION: 1.  Lines and tubes in stable position. 2. Prior CABG. Cardiomegaly. Diffuse bilateral pulmonary infiltrates/edema again noted. No significant interim change. Electronically Signed   By: Marcello Moores  Register   On: 03/28/2018 09:05   Dg Chest Port 1 View  Result Date: 03/27/2018 CLINICAL DATA:  Endotracheal tube present.  Respiratory failure. EXAM: PORTABLE CHEST 1 VIEW COMPARISON:  One-view chest x-ray 03/25/2018. FINDINGS: The heart is enlarged. Endotracheal tube is in stable and satisfactory position. A new right PICC line is in place. The heart is enlarged. Bilateral interstitial and airspace disease is again noted. IMPRESSION: 1. Interval placement of right-sided PICC line. The tip is at the cavoatrial junction. 2. Stable cardiomegaly and diffuse interstitial and airspace disease, likely edema and CHF. Electronically Signed   By: San Morelle M.D.   On: 03/27/2018 08:37   Dg Chest Port 1 View  Result Date: 03/25/2018 CLINICAL DATA:  Post intubation EXAM: PORTABLE CHEST 1 VIEW COMPARISON:  03/25/2018 FINDINGS: Changes of CABG. Endotracheal tube is in place with the tip approximately 2.5 cm above the carina. NG tube enters the stomach. Cardiomegaly. Worsening bilateral airspace opacities, likely edema. No visible effusions or acute bony abnormality. IMPRESSION: Cardiomegaly. Worsening bilateral airspace disease, likely edema/CHF. Electronically Signed   By: Rolm Baptise M.D.   On: 03/25/2018 20:59   Dg Chest Port 1 View  Result Date: 03/25/2018 CLINICAL DATA:  Cough, vomiting, wheezing EXAM: PORTABLE CHEST 1 VIEW COMPARISON:  07/07/2017 FINDINGS: Prior CABG. Cardiomegaly with vascular congestion and interstitial opacities, likely edema. No effusions or acute bony abnormality. IMPRESSION: Cardiomegaly, mild CHF. Electronically Signed   By: Rolm Baptise M.D.   On: 03/25/2018 17:33   [place] Ekg Site Rite  Result Date: 03/26/2018 If [facility] image not attached, placement could not be  confirmed due to current cardiac rhythm.    ASSESSMENT/PLAN:  This is a pleasant 73 year old female with  1.  Thrombocytopenia: Platelets were down to less than 5000. Likely related to her acute illness, especially  pneumonia and sepsis, hypertension urgency may also played a role. TTP is unlikely, given the no evidence of hemolysis.  She has been on heparin infusion since she came in, however her thrombocytopenia presented on admission, and the timing is not consistent with HIT. Could be related to ITP. Received platelets and IVIG on 03/28/2018. Platelet count has improved to 54,000 today. The patient will receive another dose of IVIG today.    2.  Acute on chronic hypoxic respiratory failure, required mechanical ventilation, extubated 03/28/2018. 3.  Community-acquired pneumonia 4. Acute systolic CHF 5. HTN  6. Lactic acidosis  7. New onset hallucinations. Patient will have a CT of the head this morning.    LOS: 4 days   Mikey Bussing, DNP, AGPCNP-BC, AOCNP 03/29/18   Addendum  I have seen the patient, examined her. I agree with the assessment and and plan and have edited the notes.   [name] [name]'s plt has significantly improved this morning after one dose of IVIG. She has no clinical bleeding. She is on [place] oxygen now, still has NG tube, her husband is very concerned about her confusion. Her CT head showed now acute findings.  I will give one more dose IVIG 39m/kg tonight. Due to her delirium, will hold on steroids for now. Continue f/u closely  YTruitt Merle 03/29/2018

## 2018-03-29 NOTE — Progress Notes (Signed)
Inpatient Diabetes Program Recommendations  AACE/ADA: New Consensus Statement on Inpatient Glycemic Control (2015)  Target Ranges:  Prepandial:   less than 140 mg/dL      Peak postprandial:   less than 180 mg/dL (1-2 hours)      Critically ill patients:  140 - 180 mg/dL   Results for ARAIYAH, CUMPTON ([name] [id number]) as of 03/29/2018 10:04  Ref. Range 03/28/2018 08:03 03/28/2018 11:20 03/28/2018 22:10 03/29/2018 07:22  Glucose-Capillary Latest Ref Range: 70 - 99 mg/dL 142 (H) 150 (H) 289 (H) 302 (H)   Review of Glycemic Control  Diabetes history: DM 2 Outpatient Diabetes medications: Lantus 50 units qhs, Humalog 18-26 units tid Current orders for Inpatient glycemic control: Lantus 25 units qhs  Inpatient Diabetes Program Recommendations:    Glucose trends 300. IV Solumedrol 125 mg given once yesterday am.  Add Novolog 0-15 units tid + Novolog 0-5 units qhs. Consider Carb modified diet. May need meal coverage once eating. Will follow trends.  Thanks,  Tama Headings RN, MSN, BC-ADM Inpatient Diabetes Coordinator Team Pager [phone] (8a-5p)

## 2018-03-29 NOTE — Progress Notes (Addendum)
OT Cancellation Note  Patient Details Name: [name] [name] [name]: [id number] DOB: 02-May-1945   Cancelled Treatment:    Reason Eval/Treat Not Completed: Patient at procedure or test/ unavailable. Transport present to take pt to CT. OT will follow up at next available time.   Gypsy Decant, MS, OTR/L 03/29/2018, 9:05 AM

## 2018-03-29 NOTE — Care Management Important Message (Signed)
Important Message  Patient Details  Name: [name] [name] [name]: [id number] Date of Birth: 03-09-1945   Medicare Important Message Given:  Yes    Orbie Pyo 03/29/2018, 3:54 PM

## 2018-03-29 NOTE — Progress Notes (Addendum)
Patient ID: [name] [name], female   DOB: 26-Mar-1945, 73 y.o.   [name]: [id number]    Advanced Heart Failure Rounding Note   Subjective:    Admitted by CCM with fever, cough and respiratory failure requiring intubation.  Flu swab and UA negative. She was started on empiric abx, now on levofloxacin only. Afebrile now and cultures negative.   Husband reports she was unable to take lasix for several days as she was throwing up. Fluid accumulated quickly.   Initially intubated, extubated 2/25.  CVP 6-7 this morning.  Good diuresis yesterday.  Creatinine down to 1.79.   Platelets dropped to <5 on 2/25.  ?ITP related to infection.  TTP unlikely, no schistocytes/evidence for hemolysis.  She has had 1 dose of solumedrol and a dose of IVIG as well as 2 units plts on 2/25.  Plts up to 54K this morning.   Overnight, she became confused.  This morning, oriented to person but still confused.   Echo EF 25-30% with global HK worse from mid-ventricle to apex. RV moderately down. (Echo 1/19 EF 55-60%)  Objective:   Weight Range:  Vital Signs:   Temp:  [97.6 F (36.4 C)-99.1 F (37.3 C)] 99.1 F (37.3 C) (02/26 0826) Pulse Rate:  [86-105] 105 (02/26 0826) Resp:  [12-21] 16 (02/26 0826) BP: (112-189)/(49-95) 189/95 (02/26 0826) SpO2:  [89 %-96 %] 96 % (02/26 0826) FiO2 (%):  [40 %-50 %] 50 % (02/26 0822) Weight:  [120.8 kg] 120.8 kg (02/26 0555) Last BM Date: 03/25/18  Weight change: Filed Weights   03/27/18 0500 03/29/18 0555  Weight: 60.4 kg 120.8 kg    Intake/Output:   Intake/Output Summary (Last 24 hours) at 03/29/2018 0833 Last data filed at 03/29/2018 0659 Gross per 24 hour  Intake 1751.03 ml  Output 4450 ml  Net -2698.97 ml     Physical Exam: General: NAD Neck: No JVD, no thyromegaly or thyroid nodule.  Lungs: Decreased BS at bases.  CV: Nondisplaced PMI.  Heart mildly tachy, regular S1/S2, no S3/S4, no murmur.  No peripheral edema.   Abdomen: Soft, nontender, no  hepatosplenomegaly, no distention.  Skin: Intact without lesions or rashes.  Neurologic: Alert and oriented to person but confused. No motor weakness.  Psych: Normal affect. Extremities: No clubbing or cyanosis.  HEENT: Normal.   Telemetry: NSR 80s with LBBB. Personally reviewed.    Labs: Basic Metabolic Panel: Recent Labs  Lab 03/25/18 1630  03/26/18 0522 03/27/18 0434 03/27/18 2979 03/28/18 0455 03/28/18 0512 03/28/18 1535 03/29/18 0518  NA 138   < > 141 141 138 139 141 140 139  K 4.1   < > 4.9 3.3* 3.1* 3.2* 3.1* 3.6 2.9*  CL 101  --  101 92*  --   --  88* 85* 80*  CO2 26  --  22 35*  --   --  42* 40* 45*  GLUCOSE 166*  --  135* 120*  --   --  138* 247* 283*  BUN 15  --  22 23  --   --  30* 31* 38*  CREATININE 1.34*  --  1.68* 1.71*  --   --  2.11* 2.05* 1.79*  CALCIUM 9.1  --  8.5* 8.2*  --   --  8.3* 8.6* 8.4*  MG 2.0  --  2.1 1.8  --   --  2.1  --  2.1  PHOS 2.8  --  3.6 2.9  --   --  2.1*  --  4.0   < > =  values in this interval not displayed.    Liver Function Tests: Recent Labs  Lab 03/25/18 1630  AST 38  ALT 22  ALKPHOS 72  BILITOT 1.5*  PROT 7.3  ALBUMIN 3.9   Recent Labs  Lab 03/25/18 1630  LIPASE 19   No results for input(s): AMMONIA in the last 168 hours.  CBC: Recent Labs  Lab 03/25/18 1630  03/27/18 0434  03/28/18 0455 03/28/18 0512 03/28/18 0820 03/28/18 1046 03/28/18 1500 03/29/18 0518  WBC 9.4   < > 13.5*  --   --  12.6* 13.3*  --  16.0* 13.9*  NEUTROABS 8.1*  --   --   --   --   --   --   --   --   --   HGB 13.4   < > 12.8   < > 12.9 12.5 12.6  --  12.3 13.1  HCT 43.9   < > 42.3   < > 38.0 41.1 40.0  --  40.8 43.3  MCV 101.4*   < > 100.0  --   --  99.5 99.5  --  100.5* 98.6  PLT 104*   < > 49*  --   --  <5* <5* <5* 23* 54*   < > = values in this interval not displayed.    Cardiac Enzymes: Recent Labs  Lab 03/25/18 1630 03/25/18 2230 03/26/18 0522  TROPONINI 0.05* 0.40* 0.48*    BNP: BNP (last 3 results) Recent  Labs    01/16/18 2150 03/25/18 1630  BNP 701.3* 1,480.1*    ProBNP (last 3 results) No results for input(s): PROBNP in the last 8760 hours.    Other results:  Imaging: Dg Chest Port 1 View  Result Date: 03/28/2018 CLINICAL DATA:  Intubation.  Respiratory failure. EXAM: PORTABLE CHEST 1 VIEW COMPARISON:  03/27/2018. FINDINGS: Right subclavian line stable position. Endotracheal tube and NG tube in stable position. Prior CABG. Cardiomegaly with diffuse bilateral pulmonary infiltrates/edema again noted. Similar findings noted on prior exam. No pleural effusion or pneumothorax. IMPRESSION: 1.  Lines and tubes in stable position. 2. Prior CABG. Cardiomegaly. Diffuse bilateral pulmonary infiltrates/edema again noted. No significant interim change. Electronically Signed   By: Marcello Moores  Register   On: 03/28/2018 09:05     Medications:     Scheduled Medications: . amiodarone  100 mg Per NG tube Daily  . amLODipine  10 mg Oral Daily  . arformoterol  15 mcg Nebulization BID  . budesonide (PULMICORT) nebulizer solution  0.25 mg Nebulization BID  . buPROPion  75 mg Oral BID  . chlorhexidine gluconate (MEDLINE KIT)  15 mL Mouth Rinse BID  . Chlorhexidine Gluconate Cloth  6 each Topical Daily  . cholecalciferol  2,000 Units Oral Q breakfast  . ezetimibe  10 mg Per Tube QHS  . gabapentin  1,200 mg Oral QHS  . gabapentin  600 mg Oral BID  . insulin glargine  25 Units Subcutaneous Q2200  . metoprolol succinate  50 mg Oral Daily  . multivitamin  1 tablet Oral Daily  . pantoprazole (PROTONIX) IV  40 mg Intravenous Q12H  . rosuvastatin  40 mg Per NG tube QHS  . sodium chloride flush  10-40 mL Intracatheter Q12H  . spironolactone  25 mg Oral Daily  . torsemide  80 mg Oral BID  . vitamin B-12  1,000 mcg Oral Daily    Infusions: . sodium chloride Stopped (03/29/18 0629)  . levofloxacin (LEVAQUIN) IV    . potassium chloride 10  mEq (03/29/18 0734)    PRN Medications: sodium chloride,  acetaminophen (TYLENOL) oral liquid 160 mg/5 mL, ALPRAZolam, bisacodyl, diphenhydrAMINE, docusate, hydrALAZINE, ipratropium-albuterol, nitroGLYCERIN, promethazine, sodium chloride flush   Assessment:   [name] [name] is a 73 y.o. female with multiple medical problems including a hx of CAD s/p CABG and multiple PCI, HFpEF s/p CardioMEMS, AF, cerebrovascular disease, CKD, DM2, PAD, admitted 2/22 with acute respiratory failure requiring mechanical intubation in the setting of URI with fever to 102 and cough.   Plan/Discussion:    1. Acute delirium: This is new overnight.  It may be related to high dose steroids.  However, her platelets were markedly low yesterday.  Given stent < 1 year ago, I would like to start her back on her Plavix now that Plts > 50K, but think we should do CT head first to rule out CNS hemorrhage.  - CT head w/o contrast this morning.  2. Acute hypoxemic respiratory failure: Suspect combination of pulmonary edema and likely PNA.  FiO2 0.5 today. CXR with diffuse bilateral airspace disease and PCT 3.08=>2.39. She is now extubated, CVP down to 6-7 this morning.  - Continue torsemide at lower dose for now, 60 mg bid.  - Continue levofloxacin per CCM.   3. ID: Suspect PNA.  Initial fever to 102.6, now afebrile.  WBCs 13.5 => 12.6 => 13.9.  - Continue levofloxacin.   4. Acute systolic CHF: Echo with EF 25-30%, severe hypokinesis-akinesis of the mid-apical LV myocardium. Prior echo with normal EF.  ?Takotsubo event with stress of acute illness.  However, she has known extensive coronary disease as well.  Good Co-ox at 82%, CVP now 6-7  Creatinine better today, 1.79 but BUN higher.  - Torsemide 60 mg bid for now and replace K.  Should repeat BMET in pm.  - Increase spironolactone to 25 mg daily.   - No ARB/ACEI/ARNI until creatinine back to baseline.    - Restart Toprol XL (was on at home) at 50 mg daily.  - When creatinine and plts stabilize, will consider cardiac cath.  4. CKD:  stage 3, baseline 1.4-1.8.  Creatinine down to 1.79 today with higher BUN.  5. CAD: Last cath 9/19 with severe native CAD and LIMA patent. Sequential SVG-PLOM/OM1 with 80% proximal stenosis, s/p DES. SVG to RCA chronically occluded. No chest pain.  Mild elevation in troponin with no trend, likely demand ischemia. Echo with newly low EF, may be Takotsubo.  - Continue statin, Plavix on hold with low plts.  Plts up to 54K today.  Given confusion, will get head CT to rule out bleed.  If this is negative, would restart Plavix if ok with hematology.  - Possible eventual cath with fall in EF.  6. Atrial fibrillation: Paroxysmal.  She is in NSR. Off anticoagulation with fall in platelets, will eventually need to restart.  7. Thrombocytopenia: ?ITP triggered by acute infection.  Doubt TTP, no schistocytes/evidence for hemolysis.  Got 2 units plts 2/25.  Given a dose of Solumedrol and IVIG.  Plts now up to 54K.  - Hematology following.  - As above, will restart Plavix today if ok with hematology and head CT negative.  8. HTN: BP running very high today.  Will restart home amlodipine.   Out of bed, mobilize.   CRITICAL CARE Performed by: Loralie Champagne  Total critical care time: 35 minutes  Critical care time was exclusive of separately billable procedures and treating other patients.  Critical care was necessary to treat or prevent  imminent or life-threatening deterioration.  Critical care was time spent personally by me on the following activities: development of treatment plan with patient and/or surrogate as well as nursing, discussions with consultants, evaluation of patient's response to treatment, examination of patient, obtaining history from patient or surrogate, ordering and performing treatments and interventions, ordering and review of laboratory studies, ordering and review of radiographic studies, pulse oximetry and re-evaluation of patient's condition.  Loralie Champagne 03/29/2018 8:33 AM

## 2018-03-30 ENCOUNTER — Encounter (HOSPITAL_COMMUNITY): Payer: Medicare Other | Admitting: Cardiology

## 2018-03-30 DIAGNOSIS — I5023 Acute on chronic systolic (congestive) heart failure: Secondary | ICD-10-CM

## 2018-03-30 DIAGNOSIS — J189 Pneumonia, unspecified organism: Secondary | ICD-10-CM

## 2018-03-30 DIAGNOSIS — D6959 Other secondary thrombocytopenia: Secondary | ICD-10-CM

## 2018-03-30 DIAGNOSIS — D693 Immune thrombocytopenic purpura: Secondary | ICD-10-CM | POA: Diagnosis present

## 2018-03-30 LAB — CBC
HCT: 41.2 % (ref 36.0–46.0)
Hemoglobin: 12.8 g/dL (ref 12.0–15.0)
MCH: 30.7 pg (ref 26.0–34.0)
MCHC: 31.1 g/dL (ref 30.0–36.0)
MCV: 98.8 fL (ref 80.0–100.0)
Platelets: 74 10*3/uL — ABNORMAL LOW (ref 150–400)
RBC: 4.17 MIL/uL (ref 3.87–5.11)
RDW: 13.4 % (ref 11.5–15.5)
WBC: 15.2 10*3/uL — ABNORMAL HIGH (ref 4.0–10.5)
nRBC: 0 % (ref 0.0–0.2)

## 2018-03-30 LAB — BASIC METABOLIC PANEL
Anion gap: 13 (ref 5–15)
BUN: 55 mg/dL — ABNORMAL HIGH (ref 8–23)
CO2: 42 mmol/L — ABNORMAL HIGH (ref 22–32)
Calcium: 9 mg/dL (ref 8.9–10.3)
Chloride: 81 mmol/L — ABNORMAL LOW (ref 98–111)
Creatinine, Ser: 1.88 mg/dL — ABNORMAL HIGH (ref 0.44–1.00)
GFR calc Af Amer: 31 mL/min — ABNORMAL LOW (ref >60)
GFR calc non Af Amer: 27 mL/min — ABNORMAL LOW (ref >60)
Glucose, Bld: 188 mg/dL — ABNORMAL HIGH (ref 70–99)
Potassium: 3.2 mmol/L — ABNORMAL LOW (ref 3.5–5.1)
Sodium: 136 mmol/L (ref 135–145)

## 2018-03-30 LAB — CULTURE, BLOOD (ROUTINE X 2)
Culture: NO GROWTH
Culture: NO GROWTH

## 2018-03-30 LAB — GLUCOSE, CAPILLARY
Glucose-Capillary: 184 mg/dL — ABNORMAL HIGH (ref 70–99)
Glucose-Capillary: 200 mg/dL — ABNORMAL HIGH (ref 70–99)
Glucose-Capillary: 214 mg/dL — ABNORMAL HIGH (ref 70–99)
Glucose-Capillary: 193 mg/dL — ABNORMAL HIGH (ref 70–99)

## 2018-03-30 LAB — COOXEMETRY PANEL
Carboxyhemoglobin: 1.2 % (ref 0.5–1.5)
Methemoglobin: 1.2 % (ref 0.0–1.5)
O2 Saturation: 75.4 %
Total hemoglobin: 14.9 g/dL (ref 12.0–16.0)

## 2018-03-30 LAB — SAVE SMEAR(SSMR), FOR PROVIDER SLIDE REVIEW

## 2018-03-30 LAB — APTT: aPTT: 30 seconds (ref 24–36)

## 2018-03-30 MED ORDER — TORSEMIDE 20 MG PO TABS
60.0000 mg | ORAL_TABLET | Freq: Two times a day (BID) | ORAL | Status: DC
  Administered 2018-03-31: 60 mg via ORAL
  Filled 2018-03-30: qty 3

## 2018-03-30 MED ORDER — METOPROLOL SUCCINATE ER 50 MG PO TB24
50.0000 mg | ORAL_TABLET | Freq: Two times a day (BID) | ORAL | Status: DC
  Administered 2018-03-30 – 2018-03-31 (×3): 50 mg via ORAL
  Filled 2018-03-30 (×3): qty 1

## 2018-03-30 NOTE — Progress Notes (Signed)
Physical Therapy Treatment Patient Details Name: [name] [name] [name]: [id number] DOB: 02/24/1945 Today's Date: 03/30/2018    History of Present Illness [name] [name] is a 73 year old woman with history of asthma, OSA not on CPAP, chronic hypoxic respiratory failure on 2L at night, CAD s/p CABG and multiple PCI, HFpEF s/p CardioMEMS, AF on Eliquis, cerebrovascular disease, HTN, CKD, DM2, PAD presenting with cough found in the ED to have fever, hypertensive urgency, and acute on chronic hypoxic respiratory failure. intubated 2/22-2/25    PT Comments    Patient lethargic and drowsy today. Per RN she has been this way most of the day as she has not slept well in several days. Following cues 30-50% of time, unable to tolerate sitting or standing this session. Max A to come to sitting. RN reports patient had an active afternoon yesterday transferring to commode and BSC with staff. Will continue to see to progress mobility.      Follow Up Recommendations  SNF     Equipment Recommendations  Rolling walker with 5" wheels    Recommendations for Other Services       Precautions / Restrictions Precautions Precautions: Fall Precaution Comments: check SpO2 Restrictions Weight Bearing Restrictions: No    Mobility  Bed Mobility Overal bed mobility: Needs Assistance Bed Mobility: Rolling;Supine to Sit Rolling: Max assist   Supine to sit: Max assist     General bed mobility comments: pt lethargic and sleepy max A to coem to sitting  Transfers                 General transfer comment: unable at 1730 today, RN reports patient hasn;t slept in days and has been lethargic this afternoon.   Ambulation/Gait                 Stairs             Wheelchair Mobility    Modified Rankin (Stroke Patients Only)       Balance   Sitting-balance support: Feet supported Sitting balance-Leahy Scale: Fair Sitting balance - Comments: close supervision   Standing balance support:  During functional activity Standing balance-Leahy Scale: Poor Standing balance comment: reliance on RW and UE support                            Cognition Arousal/Alertness: Awake/alert Behavior During Therapy: Impulsive Overall Cognitive Status: Impaired/Different from baseline Area of Impairment: Awareness;Safety/judgement;Following commands;Attention                   Current Attention Level: Sustained   Following Commands: Follows one step commands inconsistently Safety/Judgement: Decreased awareness of safety;Decreased awareness of deficits Awareness: Intellectual   General Comments: continues to be hallucinating in the room and having other conversations      Exercises      General Comments        Pertinent Vitals/Pain Pain Assessment: No/denies pain    Home Living                      Prior Function            PT Goals (current goals can now be found in the care plan section) Acute Rehab PT Goals PT Goal Formulation: With patient Time For Goal Achievement: 04/12/18 Potential to Achieve Goals: Fair Progress towards PT goals: Progressing toward goals    Frequency    Min 3X/week      PT Plan Current  plan remains appropriate    Co-evaluation              AM-PAC PT "6 Clicks" Mobility   Outcome Measure  Help needed turning from your back to your side while in a flat bed without using bedrails?: A Little Help needed moving from lying on your back to sitting on the side of a flat bed without using bedrails?: A Little Help needed moving to and from a bed to a chair (including a wheelchair)?: A Lot Help needed standing up from a chair using your arms (e.g., wheelchair or bedside chair)?: A Lot Help needed to walk in hospital room?: Total Help needed climbing 3-5 steps with a railing? : Total 6 Click Score: 12    End of Session Equipment Utilized During Treatment: Oxygen Activity Tolerance: Other (comment) Patient  left: in bed;with family/visitor present Nurse Communication: Mobility status PT Visit Diagnosis: Unsteadiness on feet (R26.81)     Time: 1720-1735 PT Time Calculation (min) (ACUTE ONLY): 15 min  Charges:  $Therapeutic Activity: 8-22 mins                     Reinaldo Berber, PT, DPT Acute Rehabilitation Services Pager: [phone] Office: [phone]     Reinaldo Berber 03/30/2018, 5:33 PM

## 2018-03-30 NOTE — Progress Notes (Signed)
Dr. Burr Medico at bedside

## 2018-03-30 NOTE — Progress Notes (Signed)
[name] [name]   DOB:08/25/1945   BZ#:[id number]   BOF#:[id number]  Hematology follow up note   Subjective: Patient remains to be confused, disoriented, did not sleep well last night.  She did take Xanax and Benadryl which helped sleeping.  She pulled her NG tube out last night, vital signs stable, afebrile, no new bleedings.   Objective:  Vitals:   03/30/18 0200 03/30/18 0340  BP: (!) 150/72 (!) 155/60  Pulse:  78  Resp:    Temp: 98.2 F (36.8 C) 98.1 F (36.7 C)  SpO2:  99%    Body mass index is 50.08 kg/m.  Intake/Output Summary (Last 24 hours) at 03/30/2018 0755 Last data filed at 03/30/2018 0500 Gross per 24 hour  Intake 2007.08 ml  Output 1500 ml  Net 507.08 ml     Sclerae unicteric  Oropharynx clear  No peripheral adenopathy  Lungs clear -- no rales or rhonchi  Heart regular rate and rhythm  Abdomen benign  Neuro nonfocal  Multiple small ecchymosis on arms and legs  CBG (last 3)  Recent Labs    03/29/18 1212 03/29/18 1655 03/29/18 2100  GLUCAP 297* 318* 274*     Labs:  Lab Results  Component Value Date   WBC 15.2 (H) 03/30/2018   HGB 12.8 03/30/2018   HCT 41.2 03/30/2018   MCV 98.8 03/30/2018   PLT 74 (L) 03/30/2018   NEUTROABS 8.1 (H) 03/25/2018    CMP Latest Ref Rng & Units 03/30/2018 03/29/2018 03/29/2018  Glucose 70 - 99 mg/dL 188(H) 339(H) 283(H)  BUN 8 - 23 mg/dL 55(H) 47(H) 38(H)  Creatinine 0.44 - 1.00 mg/dL 1.88(H) 1.86(H) 1.79(H)  Sodium 135 - 145 mmol/L 136 136 139  Potassium 3.5 - 5.1 mmol/L 3.2(L) 3.5 2.9(L)  Chloride 98 - 111 mmol/L 81(L) 81(L) 80(L)  CO2 22 - 32 mmol/L 42(H) 42(H) 45(H)  Calcium 8.9 - 10.3 mg/dL 9.0 9.0 8.4(L)  Total Protein 6.5 - 8.1 g/dL - - -  Total Bilirubin 0.3 - 1.2 mg/dL - - -  Alkaline Phos 38 - 126 U/L - - -  AST 15 - 41 U/L - - -  ALT 0 - 44 U/L - - -     Urine Studies No results for input(s): UHGB, CRYS in the last 72 hours.  Invalid input(s): UACOL, UAPR, USPG, UPH, UTP, UGL, UKET, UBIL, UNIT,  UROB, ULEU, UEPI, UWBC, Junie Panning [place], [place], [place]  Basic Metabolic Panel: Recent Labs  Lab 03/25/18 1630  03/26/18 0522 03/27/18 0434  03/28/18 0512 03/28/18 1535 03/29/18 0518 03/29/18 1645 03/30/18 0309  NA 138   < > 141 141   < > 141 140 139 136 136  K 4.1   < > 4.9 3.3*   < > 3.1* 3.6 2.9* 3.5 3.2*  CL 101  --  101 92*  --  88* 85* 80* 81* 81*  CO2 26  --  22 35*  --  42* 40* 45* 42* 42*  GLUCOSE 166*  --  135* 120*  --  138* 247* 283* 339* 188*  BUN 15  --  22 23  --  30* 31* 38* 47* 55*  CREATININE 1.34*  --  1.68* 1.71*  --  2.11* 2.05* 1.79* 1.86* 1.88*  CALCIUM 9.1  --  8.5* 8.2*  --  8.3* 8.6* 8.4* 9.0 9.0  MG 2.0  --  2.1 1.8  --  2.1  --  2.1  --   --   PHOS 2.8  --  3.6 2.9  --  2.1*  --  4.0  --   --    < > = values in this interval not displayed.   GFR Estimated Creatinine Clearance: 35.7 mL/min (A) (by C-G formula based on SCr of 1.88 mg/dL (H)). Liver Function Tests: Recent Labs  Lab 03/25/18 1630  AST 38  ALT 22  ALKPHOS 72  BILITOT 1.5*  PROT 7.3  ALBUMIN 3.9   Recent Labs  Lab 03/25/18 1630  LIPASE 19   No results for input(s): AMMONIA in the last 168 hours. Coagulation profile Recent Labs  Lab 03/25/18 1630 03/28/18 1046  INR 1.60 1.5*    CBC: Recent Labs  Lab 03/25/18 1630  03/28/18 0512 03/28/18 0820 03/28/18 1046 03/28/18 1500 03/29/18 0518 03/30/18 0309  WBC 9.4   < > 12.6* 13.3*  --  16.0* 13.9* 15.2*  NEUTROABS 8.1*  --   --   --   --   --   --   --   HGB 13.4   < > 12.5 12.6  --  12.3 13.1 12.8  HCT 43.9   < > 41.1 40.0  --  40.8 43.3 41.2  MCV 101.4*   < > 99.5 99.5  --  100.5* 98.6 98.8  PLT 104*   < > <5* <5* <5* 23* 54* 74*   < > = values in this interval not displayed.   Cardiac Enzymes: Recent Labs  Lab 03/25/18 1630 03/25/18 2230 03/26/18 0522  TROPONINI 0.05* 0.40* 0.48*   BNP: Invalid input(s): POCBNP CBG: Recent Labs  Lab 03/28/18 2210 03/29/18 0722 03/29/18 1212 03/29/18 1655  03/29/18 2100  GLUCAP 289* 302* 297* 318* 274*   D-Dimer Recent Labs    03/28/18 1046  DDIMER 2.45*   Hgb A1c No results for input(s): HGBA1C in the last 72 hours. Lipid Profile No results for input(s): CHOL, HDL, LDLCALC, TRIG, CHOLHDL, LDLDIRECT in the last 72 hours. Thyroid function studies No results for input(s): TSH, T4TOTAL, T3FREE, THYROIDAB in the last 72 hours.  Invalid input(s): FREET3 Anemia work up Recent Labs    03/28/18 1355  RETICCTPCT 1.1   Microbiology Recent Results (from the past 240 hour(s))  Urine culture     Status: Abnormal   Collection Time: 03/25/18  4:18 PM  Result Value Ref Range Status   Specimen Description URINE, RANDOM  Final   Special Requests   Final    NONE Performed at [facility], [address] [place], [place] [address]    Culture 10,000 COLONIES/mL ESCHERICHIA COLI (A)  Final   Report Status 03/27/2018 FINAL  Final   Organism ID, Bacteria ESCHERICHIA COLI (A)  Final      Susceptibility   Escherichia coli - MIC*    AMPICILLIN <=2 SENSITIVE Sensitive     CEFAZOLIN <=4 SENSITIVE Sensitive     CEFTRIAXONE <=1 SENSITIVE Sensitive     CIPROFLOXACIN <=0.25 SENSITIVE Sensitive     GENTAMICIN <=1 SENSITIVE Sensitive     IMIPENEM <=0.25 SENSITIVE Sensitive     NITROFURANTOIN <=16 SENSITIVE Sensitive     TRIMETH/SULFA <=20 SENSITIVE Sensitive     AMPICILLIN/SULBACTAM <=2 SENSITIVE Sensitive     PIP/TAZO <=4 SENSITIVE Sensitive     Extended ESBL NEGATIVE Sensitive     * 10,000 COLONIES/mL ESCHERICHIA COLI  Culture, blood (routine x 2)     Status: None (Preliminary result)   Collection Time: 03/25/18  4:18 PM  Result Value Ref Range Status   Specimen Description  BLOOD RIGHT ANTECUBITAL  Final   Special Requests   Final    BOTTLES DRAWN AEROBIC AND ANAEROBIC Blood Culture results may not be optimal due to an inadequate volume of blood received in culture bottles   Culture   Final    NO GROWTH 4 DAYS Performed at [facility], [place] [address], [place], [place] [address]    Report Status PENDING  Incomplete  Culture, blood (routine x 2)     Status: None (Preliminary result)   Collection Time: 03/25/18  4:23 PM  Result Value Ref Range Status   Specimen Description BLOOD LEFT ANTECUBITAL  Final   Special Requests   Final    AEROBIC BOTTLE ONLY Blood Culture results may not be optimal due to an inadequate volume of blood received in culture bottles   Culture   Final    NO GROWTH 4 DAYS Performed at [facility], [place] [address], [place], [place] [address]    Report Status PENDING  Incomplete  MRSA PCR Screening     Status: None   Collection Time: 03/26/18 12:00 AM  Result Value Ref Range Status   MRSA by PCR NEGATIVE NEGATIVE Final    Comment:        The GeneXpert MRSA Assay (FDA approved for NASAL specimens only), is one component of a comprehensive MRSA colonization surveillance program. It is not intended to diagnose MRSA infection nor to guide or monitor treatment for MRSA infections. Performed at [facility], [place] [address], [place], [place] [address]   Culture, respiratory (non-expectorated)     Status: None   Collection Time: 03/27/18  8:46 AM  Result Value Ref Range Status   Specimen Description TRACHEAL ASPIRATE  Final   Special Requests NONE  Final   Gram Stain   Final    ABUNDANT WBC PRESENT, PREDOMINANTLY PMN NO ORGANISMS SEEN    Culture   Final    FEW Consistent with normal respiratory flora. Performed at [facility], [place] [address] [place],  [address]    Report Status 03/29/2018 FINAL  Final      Studies:  Ct Head Wo Contrast  Result Date: 03/29/2018 CLINICAL DATA:  Altered mental status EXAM: CT HEAD WITHOUT CONTRAST TECHNIQUE: Contiguous axial images were obtained from the base of the skull through the vertex without intravenous contrast. COMPARISON:  None. FINDINGS: Brain: There is an old right occipital lobe infarct. No acute hemorrhage,  mass or extra-axial collection. There is periventricular hypoattenuation compatible with chronic microvascular disease. Parenchymal volume is normal for age. Vascular: No abnormal hyperdensity of the major intracranial arteries or dural venous sinuses. No intracranial atherosclerosis. Skull: The visualized skull base, calvarium and extracranial soft tissues are normal. Sinuses/Orbits: The patient is intubated. There is fluid within the sphenoid sinuses. Mild ethmoid sinus mucosal thickening. The orbits are normal. IMPRESSION: Old right occipital lobe infarct and chronic small vessel disease without acute intracranial abnormality. Electronically Signed   By: Ulyses Jarred M.D.   On: 03/29/2018 13:35   Dg Chest Port 1 View  Result Date: 03/29/2018 CLINICAL DATA:  Shortness of breath, respiratory EXAM: PORTABLE CHEST 1 VIEW COMPARISON:  03/28/2018 FINDINGS: Endotracheal tube not visualized. Bilateral diffuse interstitial and alveolar airspace opacities. No significant pleural effusion. No pneumothorax. Stable cardiomegaly. Prior CABG. Nasogastric tube coursing below the diaphragm projecting over the stomach. Right-sided PICC line with the tip projecting over the SVC. No acute osseous abnormality. IMPRESSION: 1. Bilateral interstitial and alveolar airspace  opacities concerning for pulmonary edema versus multilobar pneumonia. Electronically Signed   By: Kathreen Devoid   On: 03/29/2018 10:00    Assessment: 73 y.o. with past medical history of asthma, OSA, on home oxygen 2 L at night, coronary artery disease, status post CABG and multiple PCI, atrial fibrillation on Eliquis, CVA, hypertension, CKD, diabetes, presented with fever and cough, hypertension urgency, acute on chronic respite failure required intubation, will consult for thrombocytopenia with platelet less than 5k  1. Thrombocytopenia, probable infection induced ITP 2.  Acute on chronic hypoxic respite failure, extubated, improved 3.  Acute delirium, CT  head negative for acute findings 4.  Acute on chronic systolic congestive heart failure 5. Acute on chronic renal failure, improving  6. HTN, CAD, AF 7.  Community-acquired pneumonia 8. DM with hyperglycemia     Plan:  -Her thrombocytopenia is improving nicely, platelet count 74K this morning.  She has received IVIG 1 mg/kg daily for 2 days, and responded well.  Due to her delirium and uncontrolled hyperglycemia, I would not give steroids for now, and hopefully his plt will fully recover soon.  -other management per primary team -we will f/u    Truitt Merle, MD 03/30/2018  7:55 AM

## 2018-03-30 NOTE — Progress Notes (Signed)
PROGRESS NOTE    [name] [name]  DJM:[id number] DOB: [birth year] DOA: 03/25/2018 PCP: Derinda Late, MD   Brief Narrative:  73 year old female with history of asthma, obstructive sleep apnea not on CPAP, chronic hypoxia on 2 L nasal cannula, CAD status post CABG, CHF with preserved ejection fraction, diabetes mellitus type 2, peripheral arterial disease, essential hypertension came to the hospital with complaints of cough with elevated blood pressure.  Patient was severely hypoxic requiring intubation initially and started on diuretics and aztreonam.  Hospital course complicated by severe thrombocytopenia concerning for ITP requiring IV steroids and IVIG.  Eventually extubated 2/25.  Echo in the hospital showed ejection fraction 25-30%   Assessment & Plan:   Active Problems:   Acute respiratory failure with hypoxia (HCC)   Thrombocytopenia (HCC)   Community acquired pneumonia   Acute on chronic congestive heart failure (HCC)   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  Acute systolic congestive heart failure, ejection fraction 25-30%, class III - Echocardiogram about a year ago showed ejection fraction 55 to 60%.  Cardiology team is following.  Diuresis per their team.  Continue strict input and output. - Continue Toprol-XL 50 mg twice daily, Aldactone 25 mg daily, holding torsemide and can resume tomorrow morning depending on renal function.  Thrombocytopenia, suspect ITP - Due to mental status hold off on giving IV steroids.  Platelets have improved to 74,000 status post 2 doses of IVIG administration.  We will continue to monitor platelet levels.  No obvious signs of bleeding noted. -Hematology team following  Acute metabolic encephalopathy -Delirium versus underlying infection versus steroid-induced.  Will closely monitor.  Per family this is little better today. No focal neuro deficits. If needed, will get further radiologic imaging.   Community-acquired pneumonia - Currently patient is on  Levaquin.  Continue supportive care.  Bronchodilators as needed.  Diabetes mellitus type 2; uncontrolled  Peripheral neuropathy secondary to diabetes mellitus type 2 -Continue insulin sliding scale, Lantus.  Gabapentin as well.  Chronic kidney disease stage III -Around baseline creatinine of 1.7.  Closely monitor, monitor urine output  Paroxysmal atrial fibrillation -Currently rate control strategy.  Continue Plavix with caution in the setting of thrombocytopenia.  Possibly will resume anticoagulation once platelets have adequately improved, preferably greater than 100,000. -Continue amiodarone  GERD -PPI  Essential hypertension -Continue Norvasc, Coreg, Aldactone   DVT prophylaxis: No chemoprophylaxis due to thrombocytopenia Code Status: Full code Family Communication: Daughter at bedside Disposition Plan: Acute inpatient stay until cleared by cardiology and hematology.  Has quite a bit of room for improvement from breathing standpoint.  Consultants:   Heart failure  Hematology  Procedures:   None  Antimicrobials:   Levaquin   Subjective: Still confused but much better. Having off and on hallucinations.   Review of Systems Otherwise negative except as per HPI, including: General: Denies fever, chills, night sweats or unintended weight loss. Resp: Denies cough, wheezing, shortness of breath. Cardiac: Denies chest pain, palpitations, orthopnea, paroxysmal nocturnal dyspnea. GI: Denies abdominal pain, nausea, vomiting, diarrhea or constipation GU: Denies dysuria, frequency, hesitancy or incontinence MS: Denies muscle aches, joint pain or swelling Neuro: Denies headache, neurologic deficits (focal weakness, numbness, tingling), abnormal gait Psych: Denies anxiety, depression, SI/HI/AVH Skin: Denies new rashes or lesions ID: Denies sick contacts, exotic exposures, travel  Objective: Vitals:   03/30/18 0340 03/30/18 0431 03/30/18 0754 03/30/18 0844  BP: (!)  155/60   (!) 165/81  Pulse: 78   83  Resp:    (!) 28  Temp: 98.1  F (36.7 C)   98.5 F (36.9 C)  TempSrc: Oral   Oral  SpO2: 99%  97% 96%  Weight:  126.2 kg    Height:        Intake/Output Summary (Last 24 hours) at 03/30/2018 1225 Last data filed at 03/30/2018 0500 Gross per 24 hour  Intake 1767.08 ml  Output 1500 ml  Net 267.08 ml   Filed Weights   03/27/18 0500 03/29/18 0555 03/30/18 0431  Weight: 60.4 kg 120.8 kg 126.2 kg    Examination:  General exam: Appears calm and comfortable ; on 5L HFNC Respiratory system: Diffuse diminished BS Cardiovascular system: S1 & S2 heard, RRR. No JVD, murmurs, rubs, gallops or clicks. No pedal edema. Gastrointestinal system: Abdomen is nondistended, soft and nontender. No organomegaly or masses felt. Normal bowel sounds heard. Central nervous system: Alert and oriented. No focal neurological deficits. Extremities: Symmetric 4 x 5 power. Skin: No rashes, lesions or ulcers Psychiatry: Judgement and insight appear normal. Mood & affect appropriate.     Data Reviewed:   CBC: Recent Labs  Lab 03/25/18 1630  03/28/18 0512 03/28/18 0820 03/28/18 1046 03/28/18 1500 03/29/18 0518 03/30/18 0309  WBC 9.4   < > 12.6* 13.3*  --  16.0* 13.9* 15.2*  NEUTROABS 8.1*  --   --   --   --   --   --   --   HGB 13.4   < > 12.5 12.6  --  12.3 13.1 12.8  HCT 43.9   < > 41.1 40.0  --  40.8 43.3 41.2  MCV 101.4*   < > 99.5 99.5  --  100.5* 98.6 98.8  PLT 104*   < > <5* <5* <5* 23* 54* 74*   < > = values in this interval not displayed.   Basic Metabolic Panel: Recent Labs  Lab 03/25/18 1630  03/26/18 0522 03/27/18 0434  03/28/18 0512 03/28/18 1535 03/29/18 0518 03/29/18 1645 03/30/18 0309  NA 138   < > 141 141   < > 141 140 139 136 136  K 4.1   < > 4.9 3.3*   < > 3.1* 3.6 2.9* 3.5 3.2*  CL 101  --  101 92*  --  88* 85* 80* 81* 81*  CO2 26  --  22 35*  --  42* 40* 45* 42* 42*  GLUCOSE 166*  --  135* 120*  --  138* 247* 283* 339* 188*    BUN 15  --  22 23  --  30* 31* 38* 47* 55*  CREATININE 1.34*  --  1.68* 1.71*  --  2.11* 2.05* 1.79* 1.86* 1.88*  CALCIUM 9.1  --  8.5* 8.2*  --  8.3* 8.6* 8.4* 9.0 9.0  MG 2.0  --  2.1 1.8  --  2.1  --  2.1  --   --   PHOS 2.8  --  3.6 2.9  --  2.1*  --  4.0  --   --    < > = values in this interval not displayed.   GFR: Estimated Creatinine Clearance: 35.7 mL/min (A) (by C-G formula based on SCr of 1.88 mg/dL (H)). Liver Function Tests: Recent Labs  Lab 03/25/18 1630  AST 38  ALT 22  ALKPHOS 72  BILITOT 1.5*  PROT 7.3  ALBUMIN 3.9   Recent Labs  Lab 03/25/18 1630  LIPASE 19   No results for input(s): AMMONIA in the last 168 hours. Coagulation Profile: Recent Labs  Lab  03/25/18 1630 03/28/18 1046  INR 1.60 1.5*   Cardiac Enzymes: Recent Labs  Lab 03/25/18 1630 03/25/18 2230 03/26/18 0522  TROPONINI 0.05* 0.40* 0.48*   BNP (last 3 results) No results for input(s): PROBNP in the last 8760 hours. HbA1C: No results for input(s): HGBA1C in the last 72 hours. CBG: Recent Labs  Lab 03/29/18 0722 03/29/18 1212 03/29/18 1655 03/29/18 2100 03/30/18 0843  GLUCAP 302* 297* 318* 274* 184*   Lipid Profile: No results for input(s): CHOL, HDL, LDLCALC, TRIG, CHOLHDL, LDLDIRECT in the last 72 hours. Thyroid Function Tests: No results for input(s): TSH, T4TOTAL, FREET4, T3FREE, THYROIDAB in the last 72 hours. Anemia Panel: Recent Labs    03/28/18 1355  RETICCTPCT 1.1   Sepsis Labs: Recent Labs  Lab 03/25/18 1630 03/25/18 2126 03/25/18 2230 03/26/18 0003 03/27/18 0434 03/28/18 0512  PROCALCITON  --   --  0.32  --  3.08 2.39  LATICACIDVEN 1.8 2.2*  --  1.7  --   --     Recent Results (from the past 240 hour(s))  Urine culture     Status: Abnormal   Collection Time: 03/25/18  4:18 PM  Result Value Ref Range Status   Specimen Description URINE, RANDOM  Final   Special Requests   Final    NONE Performed at [facility], [place] [address]  [place], [place] [address]    Culture 10,000 COLONIES/mL ESCHERICHIA COLI (A)  Final   Report Status 03/27/2018 FINAL  Final   Organism ID, Bacteria ESCHERICHIA COLI (A)  Final      Susceptibility   Escherichia coli - MIC*    AMPICILLIN <=2 SENSITIVE Sensitive     CEFAZOLIN <=4 SENSITIVE Sensitive     CEFTRIAXONE <=1 SENSITIVE Sensitive     CIPROFLOXACIN <=0.25 SENSITIVE Sensitive     GENTAMICIN <=1 SENSITIVE Sensitive     IMIPENEM <=0.25 SENSITIVE Sensitive     NITROFURANTOIN <=16 SENSITIVE Sensitive     TRIMETH/SULFA <=20 SENSITIVE Sensitive     AMPICILLIN/SULBACTAM <=2 SENSITIVE Sensitive     PIP/TAZO <=4 SENSITIVE Sensitive     Extended ESBL NEGATIVE Sensitive     * 10,000 COLONIES/mL ESCHERICHIA COLI  Culture, blood (routine x 2)     Status: None (Preliminary result)   Collection Time: 03/25/18  4:18 PM  Result Value Ref Range Status   Specimen Description BLOOD RIGHT ANTECUBITAL  Final   Special Requests   Final    BOTTLES DRAWN AEROBIC AND ANAEROBIC Blood Culture results may not be optimal due to an inadequate volume of blood received in culture bottles   Culture   Final    NO GROWTH 4 DAYS Performed at [facility] [address], [place], [place] [address]    Report Status PENDING  Incomplete  Culture, blood (routine x 2)     Status: None (Preliminary result)   Collection Time: 03/25/18  4:23 PM  Result Value Ref Range Status   Specimen Description BLOOD LEFT ANTECUBITAL  Final   Special Requests   Final    AEROBIC BOTTLE ONLY Blood Culture results may not be optimal due to an inadequate volume of blood received in culture bottles   Culture   Final    NO GROWTH 4 DAYS Performed at [facility], [place] [address], [place], [place] [address]    Report Status PENDING  Incomplete  MRSA PCR Screening     Status: None   Collection Time: 03/26/18 12:00 AM  Result  Value Ref Range Status   MRSA by PCR NEGATIVE NEGATIVE Final    Comment:        The GeneXpert MRSA  Assay (FDA approved for NASAL specimens only), is one component of a comprehensive MRSA colonization surveillance program. It is not intended to diagnose MRSA infection nor to guide or monitor treatment for MRSA infections. Performed at [facility], [place] [address] [place], [place] [address]   Culture, respiratory (non-expectorated)     Status: None   Collection Time: 03/27/18  8:46 AM  Result Value Ref Range Status   Specimen Description TRACHEAL ASPIRATE  Final   Special Requests NONE  Final   Gram Stain   Final    ABUNDANT WBC PRESENT, PREDOMINANTLY PMN NO ORGANISMS SEEN    Culture   Final    FEW Consistent with normal respiratory flora. Performed at [facility], [place] [address], [place], [place] [address]    Report Status 03/29/2018 FINAL  Final         Radiology Studies: Ct Head Wo Contrast  Result Date: 03/29/2018 CLINICAL DATA:  Altered mental status EXAM: CT HEAD WITHOUT CONTRAST TECHNIQUE: Contiguous axial images were obtained from the base of the skull through the vertex without intravenous contrast. COMPARISON:  None. FINDINGS: Brain: There is an old right occipital lobe infarct. No acute hemorrhage, mass or extra-axial collection. There is periventricular hypoattenuation compatible with chronic microvascular disease. Parenchymal volume is normal for age. Vascular: No abnormal hyperdensity of the major intracranial arteries or dural venous sinuses. No intracranial atherosclerosis. Skull: The visualized skull base, calvarium and extracranial soft tissues are normal. Sinuses/Orbits: The patient is intubated. There is fluid within the sphenoid sinuses. Mild ethmoid sinus mucosal thickening. The orbits are normal. IMPRESSION: Old right occipital lobe infarct and chronic small vessel disease without acute intracranial abnormality. Electronically Signed   By: Ulyses Jarred M.D.   On: 03/29/2018 13:35   Dg Chest Port 1 View  Result Date: 03/29/2018 CLINICAL  DATA:  Shortness of breath, respiratory EXAM: PORTABLE CHEST 1 VIEW COMPARISON:  03/28/2018 FINDINGS: Endotracheal tube not visualized. Bilateral diffuse interstitial and alveolar airspace opacities. No significant pleural effusion. No pneumothorax. Stable cardiomegaly. Prior CABG. Nasogastric tube coursing below the diaphragm projecting over the stomach. Right-sided PICC line with the tip projecting over the SVC. No acute osseous abnormality. IMPRESSION: 1. Bilateral interstitial and alveolar airspace opacities concerning for pulmonary edema versus multilobar pneumonia. Electronically Signed   By: Kathreen Devoid   On: 03/29/2018 10:00        Scheduled Meds: . amiodarone  100 mg Per NG tube Daily  . amLODipine  10 mg Oral Daily  . arformoterol  15 mcg Nebulization BID  . budesonide (PULMICORT) nebulizer solution  0.25 mg Nebulization BID  . buPROPion  75 mg Oral BID  . chlorhexidine gluconate (MEDLINE KIT)  15 mL Mouth Rinse BID  . Chlorhexidine Gluconate Cloth  6 each Topical Daily  . cholecalciferol  2,000 Units Oral Q breakfast  . clopidogrel  75 mg Oral Daily  . ezetimibe  10 mg Per Tube QHS  . gabapentin  1,200 mg Oral QHS  . gabapentin  600 mg Oral BID  . insulin aspart  0-15 Units Subcutaneous TID WC  . insulin aspart  0-5 Units Subcutaneous QHS  . insulin glargine  25 Units Subcutaneous Q2200  . metoprolol succinate  50 mg Oral BID  . multivitamin  1 tablet Oral Daily  . pantoprazole (PROTONIX) IV  40  mg Intravenous Q12H  . rosuvastatin  40 mg Per NG tube QHS  . sodium chloride flush  10-40 mL Intracatheter Q12H  . spironolactone  25 mg Oral Daily  . [START ON 03/31/2018] torsemide  60 mg Oral BID  . vitamin B-12  1,000 mcg Oral Daily   Continuous Infusions: . sodium chloride Stopped (03/29/18 0629)  . levofloxacin (LEVAQUIN) IV 500 mg (03/29/18 1400)     LOS: 5 days   Time spent= 35 mins    Genia Perin Arsenio Loader, MD [facility]  If 7PM-7AM, please contact  night-coverage [email or web address] 03/30/2018, 12:25 PM

## 2018-03-30 NOTE — Progress Notes (Signed)
Patient ID: [name] [name], female   DOB: 06-18-1945, 73 y.o.   [name]: [id number]    Advanced Heart Failure Rounding Note   Subjective:    Admitted by CCM with fever, cough and respiratory failure requiring intubation.  Flu swab and UA negative. She was started on empiric abx, now on levofloxacin only. Afebrile now and cultures negative.   Husband reports she was unable to take torsemide for several days as she was throwing up. Fluid accumulated quickly.   Initially intubated, extubated 2/25.  CVP 8 this morning.  Creatinine mildly higher at 1.88.  CXR 2/26 with suspected multilobar PNA.   Platelets dropped to <5 on 2/25.  ?ITP related to infection.  TTP unlikely, no schistocytes/evidence for hemolysis.  She has had 1 dose of solumedrol and 2 doses of IVIG as well as 2 units plts on 2/25.  Plts up to 74K this morning.   Delirium is ongoing.  CT head 2/26 showed no acute abnormality.  She is oriented to person/place today but remains confused, waxes/wanes per daughter.    Echo EF 25-30% with global HK worse from mid-ventricle to apex. RV moderately down. (Echo 1/19 EF 55-60%)  Objective:   Weight Range:  Vital Signs:   Temp:  [97.6 F (36.4 C)-98.5 F (36.9 C)] 98.5 F (36.9 C) (02/27 0844) Pulse Rate:  [72-84] 83 (02/27 0844) Resp:  [14-28] 28 (02/27 0844) BP: (127-167)/(59-85) 165/81 (02/27 0844) SpO2:  [96 %-100 %] 96 % (02/27 0844) Weight:  [126.2 kg] 126.2 kg (02/27 0431) Last BM Date: 03/25/18  Weight change: Filed Weights   03/27/18 0500 03/29/18 0555 03/30/18 0431  Weight: 60.4 kg 120.8 kg 126.2 kg    Intake/Output:   Intake/Output Summary (Last 24 hours) at 03/30/2018 1113 Last data filed at 03/30/2018 0500 Gross per 24 hour  Intake 1767.08 ml  Output 1500 ml  Net 267.08 ml     Physical Exam: General: NAD Neck: No JVD, no thyromegaly or thyroid nodule.  Lungs: Dependent crackles CV: Nondisplaced PMI.  Heart regular S1/S2, no S3/S4, no murmur.  No peripheral  edema.   Abdomen: Soft, nontender, no hepatosplenomegaly, no distention.  Skin: Intact without lesions or rashes.  Neurologic: Alert and oriented x 3.  Psych: Normal affect. Extremities: No clubbing or cyanosis.  HEENT: Normal.   Telemetry: NSR 80s with LBBB. Personally reviewed.    Labs: Basic Metabolic Panel: Recent Labs  Lab 03/25/18 1630  03/26/18 0522 03/27/18 0434  03/28/18 0512 03/28/18 1535 03/29/18 0518 03/29/18 1645 03/30/18 0309  NA 138   < > 141 141   < > 141 140 139 136 136  K 4.1   < > 4.9 3.3*   < > 3.1* 3.6 2.9* 3.5 3.2*  CL 101  --  101 92*  --  88* 85* 80* 81* 81*  CO2 26  --  22 35*  --  42* 40* 45* 42* 42*  GLUCOSE 166*  --  135* 120*  --  138* 247* 283* 339* 188*  BUN 15  --  22 23  --  30* 31* 38* 47* 55*  CREATININE 1.34*  --  1.68* 1.71*  --  2.11* 2.05* 1.79* 1.86* 1.88*  CALCIUM 9.1  --  8.5* 8.2*  --  8.3* 8.6* 8.4* 9.0 9.0  MG 2.0  --  2.1 1.8  --  2.1  --  2.1  --   --   PHOS 2.8  --  3.6 2.9  --  2.1*  --  4.0  --   --    < > = values in this interval not displayed.    Liver Function Tests: Recent Labs  Lab 03/25/18 1630  AST 38  ALT 22  ALKPHOS 72  BILITOT 1.5*  PROT 7.3  ALBUMIN 3.9   Recent Labs  Lab 03/25/18 1630  LIPASE 19   No results for input(s): AMMONIA in the last 168 hours.  CBC: Recent Labs  Lab 03/25/18 1630  03/28/18 0512 03/28/18 0820 03/28/18 1046 03/28/18 1500 03/29/18 0518 03/30/18 0309  WBC 9.4   < > 12.6* 13.3*  --  16.0* 13.9* 15.2*  NEUTROABS 8.1*  --   --   --   --   --   --   --   HGB 13.4   < > 12.5 12.6  --  12.3 13.1 12.8  HCT 43.9   < > 41.1 40.0  --  40.8 43.3 41.2  MCV 101.4*   < > 99.5 99.5  --  100.5* 98.6 98.8  PLT 104*   < > <5* <5* <5* 23* 54* 74*   < > = values in this interval not displayed.    Cardiac Enzymes: Recent Labs  Lab 03/25/18 1630 03/25/18 2230 03/26/18 0522  TROPONINI 0.05* 0.40* 0.48*    BNP: BNP (last 3 results) Recent Labs    01/16/18 2150  03/25/18 1630  BNP 701.3* 1,480.1*    ProBNP (last 3 results) No results for input(s): PROBNP in the last 8760 hours.    Other results:  Imaging: Ct Head Wo Contrast  Result Date: 03/29/2018 CLINICAL DATA:  Altered mental status EXAM: CT HEAD WITHOUT CONTRAST TECHNIQUE: Contiguous axial images were obtained from the base of the skull through the vertex without intravenous contrast. COMPARISON:  None. FINDINGS: Brain: There is an old right occipital lobe infarct. No acute hemorrhage, mass or extra-axial collection. There is periventricular hypoattenuation compatible with chronic microvascular disease. Parenchymal volume is normal for age. Vascular: No abnormal hyperdensity of the major intracranial arteries or dural venous sinuses. No intracranial atherosclerosis. Skull: The visualized skull base, calvarium and extracranial soft tissues are normal. Sinuses/Orbits: The patient is intubated. There is fluid within the sphenoid sinuses. Mild ethmoid sinus mucosal thickening. The orbits are normal. IMPRESSION: Old right occipital lobe infarct and chronic small vessel disease without acute intracranial abnormality. Electronically Signed   By: Ulyses Jarred M.D.   On: 03/29/2018 13:35   Dg Chest Port 1 View  Result Date: 03/29/2018 CLINICAL DATA:  Shortness of breath, respiratory EXAM: PORTABLE CHEST 1 VIEW COMPARISON:  03/28/2018 FINDINGS: Endotracheal tube not visualized. Bilateral diffuse interstitial and alveolar airspace opacities. No significant pleural effusion. No pneumothorax. Stable cardiomegaly. Prior CABG. Nasogastric tube coursing below the diaphragm projecting over the stomach. Right-sided PICC line with the tip projecting over the SVC. No acute osseous abnormality. IMPRESSION: 1. Bilateral interstitial and alveolar airspace opacities concerning for pulmonary edema versus multilobar pneumonia. Electronically Signed   By: Kathreen Devoid   On: 03/29/2018 10:00     Medications:      Scheduled Medications: . amiodarone  100 mg Per NG tube Daily  . amLODipine  10 mg Oral Daily  . arformoterol  15 mcg Nebulization BID  . budesonide (PULMICORT) nebulizer solution  0.25 mg Nebulization BID  . buPROPion  75 mg Oral BID  . chlorhexidine gluconate (MEDLINE KIT)  15 mL Mouth Rinse BID  . Chlorhexidine Gluconate Cloth  6 each Topical Daily  . cholecalciferol  2,000 Units  Oral Q breakfast  . clopidogrel  75 mg Oral Daily  . ezetimibe  10 mg Per Tube QHS  . gabapentin  1,200 mg Oral QHS  . gabapentin  600 mg Oral BID  . insulin aspart  0-15 Units Subcutaneous TID WC  . insulin aspart  0-5 Units Subcutaneous QHS  . insulin glargine  25 Units Subcutaneous Q2200  . metoprolol succinate  50 mg Oral BID  . multivitamin  1 tablet Oral Daily  . pantoprazole (PROTONIX) IV  40 mg Intravenous Q12H  . rosuvastatin  40 mg Per NG tube QHS  . sodium chloride flush  10-40 mL Intracatheter Q12H  . spironolactone  25 mg Oral Daily  . [START ON 03/31/2018] torsemide  60 mg Oral BID  . vitamin B-12  1,000 mcg Oral Daily    Infusions: . sodium chloride Stopped (03/29/18 0629)  . levofloxacin (LEVAQUIN) IV 500 mg (03/29/18 1400)    PRN Medications: sodium chloride, acetaminophen (TYLENOL) oral liquid 160 mg/5 mL, ALPRAZolam, bisacodyl, diphenhydrAMINE, docusate, hydrALAZINE, ipratropium-albuterol, nitroGLYCERIN, promethazine, sodium chloride flush   Assessment:   CLARABELL MATSUOKA is a 73 y.o. female with multiple medical problems including a hx of CAD s/p CABG and multiple PCI, HFpEF s/p CardioMEMS, AF, cerebrovascular disease, CKD, DM2, PAD, admitted 2/22 with acute respiratory failure requiring mechanical intubation in the setting of URI with fever to 102 and cough.   Plan/Discussion:    1. Acute delirium: Suspect multifactorial.  She got a high dose steroid bolus on 2/25 and has had PNA/hypoxemia. CT head showed no acute abnormalities.  More clear today than prior but still with  confusion.  - Avoid further steroids.  - If no improvement by tomorrow, would consider neurology evaluation.   2. Acute hypoxemic respiratory failure: Suspect combination of pulmonary edema and likely PNA.  Initially intubated, now extubated.  CXR with diffuse bilateral airspace disease and PCT 3.08=>2.39. CVP 8 today. - Hold pm torsemide today with rise in creatinine.  - Continue levofloxacin primary service for CAP.  3. ID: Suspect PNA.  Initial fever to 102.6, now afebrile.  WBCs 13.5 => 12.6 => 13.9 => 15.  - Continue levofloxacin.   4. Acute systolic CHF: Echo with EF 25-30%, severe hypokinesis-akinesis of the mid-apical LV myocardium. Prior echo with normal EF.  ?Takotsubo event with stress of acute illness.  However, she has known extensive coronary disease as well.  Good Co-ox at 75%, CVP now 8.  BUN/creatinine higher today. - Hold pm torsemide today, resume torsemide 60 mg bid tomorrow.  - Continue spironolactone 25 mg daily.   - No ARB/ACEI/ARNI until creatinine back to baseline.    - Can increase Toprol XL to 50 mg bid.   - When creatinine and plts stabilize, will consider cardiac cath but no plans for now.  4. CKD: stage 3, baseline 1.4-1.8. BUN/creatinine higher today, hold pm torsemide.   5. CAD: Last cath 9/19 with severe native CAD and LIMA patent. Sequential SVG-PLOM/OM1 with 80% proximal stenosis, s/p DES. SVG to RCA chronically occluded. No chest pain.  Mild elevation in troponin with no trend, likely demand ischemia. Echo with newly low EF, may be Takotsubo.  - Continue statin.  - Plavix restarted yesterday with plts rising.  - Possible eventual cath with fall in EF.  6. Atrial fibrillation: Paroxysmal.  She is in NSR. Off anticoagulation with fall in platelets, will eventually need to restart Eliquis 5 mg bid (will wait until plts > 100K as she is also on  Plavix).  7. Thrombocytopenia: ?ITP triggered by acute infection.  Doubt TTP, no schistocytes/evidence for hemolysis.   Got 2 units plts 2/25.  Given a dose of Solumedrol and 2 doses IVIG.  Plts now up to 74K.  - Hematology following.  8. HTN: BP high, increasing Toprol XL as above.  9. Continue to work with PT, suspect she will need SNF.   Loralie Champagne 03/30/2018 11:13 AM

## 2018-03-31 ENCOUNTER — Ambulatory Visit (HOSPITAL_COMMUNITY): Payer: Medicare Other

## 2018-03-31 LAB — CBC
HCT: 41.8 % (ref 36.0–46.0)
Hemoglobin: 12.5 g/dL (ref 12.0–15.0)
MCH: 29.8 pg (ref 26.0–34.0)
MCHC: 29.9 g/dL — ABNORMAL LOW (ref 30.0–36.0)
MCV: 99.5 fL (ref 80.0–100.0)
Platelets: 81 10*3/uL — ABNORMAL LOW (ref 150–400)
RBC: 4.2 MIL/uL (ref 3.87–5.11)
RDW: 13.2 % (ref 11.5–15.5)
WBC: 11.1 10*3/uL — ABNORMAL HIGH (ref 4.0–10.5)
nRBC: 0 % (ref 0.0–0.2)

## 2018-03-31 LAB — COOXEMETRY PANEL
Carboxyhemoglobin: 1.8 % — ABNORMAL HIGH (ref 0.5–1.5)
Methemoglobin: 1.1 % (ref 0.0–1.5)
O2 Saturation: 75.7 %
Total hemoglobin: 13.3 g/dL (ref 12.0–16.0)

## 2018-03-31 LAB — BASIC METABOLIC PANEL
Anion gap: 11 (ref 5–15)
BUN: 76 mg/dL — ABNORMAL HIGH (ref 8–23)
CO2: 45 mmol/L — ABNORMAL HIGH (ref 22–32)
Calcium: 9 mg/dL (ref 8.9–10.3)
Chloride: 81 mmol/L — ABNORMAL LOW (ref 98–111)
Creatinine, Ser: 2.81 mg/dL — ABNORMAL HIGH (ref 0.44–1.00)
GFR calc Af Amer: 19 mL/min — ABNORMAL LOW (ref >60)
GFR calc non Af Amer: 16 mL/min — ABNORMAL LOW (ref >60)
Glucose, Bld: 188 mg/dL — ABNORMAL HIGH (ref 70–99)
Potassium: 3.3 mmol/L — ABNORMAL LOW (ref 3.5–5.1)
Sodium: 137 mmol/L (ref 135–145)

## 2018-03-31 LAB — GLUCOSE, CAPILLARY
Glucose-Capillary: 155 mg/dL — ABNORMAL HIGH (ref 70–99)
Glucose-Capillary: 278 mg/dL — ABNORMAL HIGH (ref 70–99)
Glucose-Capillary: 215 mg/dL — ABNORMAL HIGH (ref 70–99)

## 2018-03-31 MED ORDER — PANTOPRAZOLE SODIUM 40 MG PO TBEC
40.0000 mg | DELAYED_RELEASE_TABLET | Freq: Two times a day (BID) | ORAL | Status: DC
  Administered 2018-03-31 (×2): 40 mg via ORAL
  Filled 2018-03-31 (×2): qty 1

## 2018-03-31 MED ORDER — POTASSIUM CHLORIDE CRYS ER 20 MEQ PO TBCR
40.0000 meq | EXTENDED_RELEASE_TABLET | Freq: Once | ORAL | Status: AC
  Administered 2018-03-31: 40 meq via ORAL
  Filled 2018-03-31: qty 2

## 2018-03-31 MED ORDER — GLUCERNA SHAKE PO LIQD
237.0000 mL | Freq: Two times a day (BID) | ORAL | Status: DC
  Administered 2018-03-31: 237 mL via ORAL

## 2018-03-31 MED ORDER — DOCUSATE SODIUM 50 MG/5ML PO LIQD
100.0000 mg | Freq: Two times a day (BID) | ORAL | Status: DC | PRN
  Administered 2018-03-31 – 2018-04-01 (×2): 100 mg via ORAL
  Filled 2018-03-31 (×2): qty 10

## 2018-03-31 MED ORDER — GABAPENTIN 400 MG PO CAPS
500.0000 mg | ORAL_CAPSULE | Freq: Every day | ORAL | Status: DC
  Administered 2018-03-31: 500 mg via ORAL
  Filled 2018-03-31: qty 1

## 2018-03-31 MED ORDER — ACETAMINOPHEN 325 MG PO TABS: 650.0000 mg | ORAL_TABLET | Freq: Four times a day (QID) | ORAL | Status: DC | PRN

## 2018-03-31 MED ORDER — AMIODARONE HCL 100 MG PO TABS
100.0000 mg | ORAL_TABLET | Freq: Every day | ORAL | Status: DC
  Administered 2018-04-02 – 2018-04-03 (×2): 100 mg via ORAL
  Filled 2018-03-31 (×2): qty 1

## 2018-03-31 MED ORDER — SODIUM CHLORIDE 0.9 % IV BOLUS
250.0000 mL | Freq: Once | INTRAVENOUS | Status: AC
  Administered 2018-03-31: 250 mL via INTRAVENOUS

## 2018-03-31 MED ORDER — ROSUVASTATIN CALCIUM 20 MG PO TABS
40.0000 mg | ORAL_TABLET | Freq: Every day | ORAL | Status: DC
  Administered 2018-03-31 – 2018-04-09 (×10): 40 mg via ORAL
  Filled 2018-03-31 (×10): qty 2

## 2018-03-31 MED ORDER — EZETIMIBE 10 MG PO TABS
10.0000 mg | ORAL_TABLET | Freq: Every day | ORAL | Status: DC
  Administered 2018-03-31 – 2018-04-09 (×10): 10 mg via ORAL
  Filled 2018-03-31 (×10): qty 1

## 2018-03-31 MED ORDER — GABAPENTIN 100 MG PO CAPS: 200.0000 mg | ORAL_CAPSULE | Freq: Two times a day (BID) | ORAL | Status: DC

## 2018-03-31 NOTE — NC FL2 (Signed)
[place] LEVEL OF CARE SCREENING TOOL     IDENTIFICATION  Patient Name: [name] [name] Birthdate: 1945-08-18 Sex: female Admission Date (Current Location): 03/25/2018  [facility] and [place] Number:  Herbalist and Address:  The [place]. [facility], [place] [address] [place], [place] [address]      Provider Number: [id number]  Attending Physician Name and Address:  Cherene Altes, MD  Relative Name and Phone Number:       Current Level of Care: Hospital Recommended Level of Care: [place] Prior Approval Number:    Date Approved/Denied:   PASRR Number: [id number]  Discharge Plan: SNF    Current Diagnoses: Patient Active Problem List   Diagnosis Date Noted  . 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 Acute on chronic congestive heart failure ([place])   . Thrombocytopenia ([place]) 03/28/2018  . Community acquired pneumonia   . Acute respiratory failure with hypoxia ([place]) 03/25/2018  . Acute on chronic combined systolic and diastolic CHF (congestive heart failure) ([place]) 01/16/2018  . Acute on chronic diastolic CHF (congestive heart failure) ([place]) 01/16/2018  . Acute on chronic diastolic (congestive) heart failure ([place]) 01/16/2018  . Atrial fibrillation ([place]) 10/21/2017  . CAD (coronary artery disease) of bypass graft 10/20/2017  . Coronary artery disease of bypass graft of native heart with stable angina pectoris ([place])   . Chronic low back pain 08/24/2016  . Pain of left thumb 08/05/2016  . Nocturnal hypoxia 06/02/2016  . Hoarseness 04/05/2016  . Laryngopharyngeal reflux (LPR) 04/05/2016  . Pharyngoesophageal dysphagia 04/05/2016  . Angina decubitus ([place]) 05/22/2015  . Chronic insomnia 05/06/2015  . Common migraine 05/14/2014  . Abnormality of gait 05/14/2014  . Memory change 05/14/2014  . Aneurysm, cerebral, nonruptured 05/14/2014  . Cramp of limb-Left neck 05/10/2014  . Hematemesis with nausea   . Vomiting blood   . Dizziness  and giddiness 09/21/2013  . Atypical chest pain 07/18/2013  . Unstable angina ([place]) 04/09/2013  . Carotid artery stenosis, symptomatic 03/20/2013  . Chronic diastolic CHF (congestive heart failure) ([place]) 09/23/2012  . Cerebrovascular disease 07/10/2012  . Cerebral artery occlusion with cerebral infarction ([place]) 07/10/2012  . Mitral regurgitation 04/15/2012  . TIA (transient ischemic attack) 04/15/2012  . Occlusion and stenosis of carotid artery without mention of cerebral infarction 08/18/2011  . Bariatric surgery status 06/17/2011  . Speech abnormality 03/22/2011  . Dyspnea 02/24/2011  . PAPILLARY MUSCLE DYSFUNCTION, NON-RHEUMATIC 10/09/2008  . UNSPECIFIED VITAMIN D DEFICIENCY 10/24/2007  . MYOCARDIAL INFARCTION, HX OF 10/24/2007  . PERSISTENT VOMITING 10/24/2007  . OSTEOARTHRITIS 10/24/2007  . MIGRAINES, HX OF 10/24/2007  . DM 01/16/2007  . Hyperlipemia 01/16/2007  . Obesity-post failed open gastroplasty 1984  01/16/2007  . OBSTRUCTIVE SLEEP APNEA 01/16/2007  . Essential hypertension 01/16/2007  . Coronary atherosclerosis 01/16/2007  . Asthma 01/16/2007  . GERD 01/16/2007  . VENTRAL HERNIA 01/16/2007    Orientation RESPIRATION BLADDER Height & Weight     Self, Time, Place  O2(HFNC 5L) External catheter, Incontinent(03/28/18) Weight: 272 lb 14.9 oz (123.8 kg) Height:  5' 2.5" (158.8 cm)  BEHAVIORAL SYMPTOMS/MOOD NEUROLOGICAL BOWEL NUTRITION STATUS      Incontinent Diet(regular diet thin liquids)  AMBULATORY STATUS COMMUNICATION OF NEEDS Skin   Extensive Assist Verbally Normal                       Personal Care Assistance Level of Assistance  Bathing, Feeding, Dressing Bathing Assistance: Maximum assistance Feeding assistance: Limited  assistance Dressing Assistance: Maximum assistance     Functional Limitations Info  Sight, Hearing, Speech Sight Info: Adequate Hearing Info: Adequate Speech Info: Adequate    SPECIAL CARE FACTORS FREQUENCY  OT (By licensed  OT), PT (By licensed PT)     PT Frequency: 3x OT Frequency: 3x            Contractures Contractures Info: Not present    Additional Factors Info  Code Status, Allergies Code Status Info: Full Code Allergies Info: Amoxicillin, Brilinta Ticagrelor, Erythromycin, Flagyl Metronidazole, Penicillins, Isosorbide Mononitrate Isosorbide Nitrate, Jardiance Empagliflozin, Metformin And Related, Tape, Heparin, Erythromycin Base           Current Medications (03/31/2018):  This is the current hospital active medication list Current Facility-Administered Medications  Medication Dose Route Frequency Provider Last Rate Last Dose  . 0.9 %  sodium chloride infusion   Intravenous PRN Rush Farmer, MD   Stopped at 03/29/18 [phone]  . acetaminophen (TYLENOL) tablet 650 mg  650 mg Oral Q6H PRN Cherene Altes, MD      . ALPRAZolam Duanne Moron) tablet 0.5 mg  0.5 mg Oral TID PRN Milagros Loll, MD   0.5 mg at 03/29/18 2234  . [START ON 04/01/2018] amiodarone (PACERONE) tablet 100 mg  100 mg Oral Daily Joette Catching T, MD      . arformoterol [facility]) nebulizer solution 15 mcg  15 mcg Nebulization BID Milagros Loll, MD   15 mcg at 03/31/18 0749  . bisacodyl (DULCOLAX) suppository 10 mg  10 mg Rectal Daily PRN Jacques Earthly T, MD      . budesonide (PULMICORT) nebulizer solution 0.25 mg  0.25 mg Nebulization BID Jacques Earthly T, MD   0.25 mg at 03/31/18 0748  . buPROPion [facility]) tablet 75 mg  75 mg Oral BID Milagros Loll, MD   75 mg at 03/31/18 0907  . chlorhexidine gluconate (MEDLINE KIT) (PERIDEX) 0.12 % solution 15 mL  15 mL Mouth Rinse BID Milagros Loll, MD   15 mL at 03/29/18 2232  . Chlorhexidine Gluconate Cloth 2 % PADS 6 each  6 each Topical Daily Rush Farmer, MD   6 each at 03/30/18 1320  . cholecalciferol (VITAMIN D3) tablet 2,000 Units  2,000 Units Oral Q breakfast Milagros Loll, MD   2,000 Units at 03/31/18 0901  . clopidogrel (PLAVIX) tablet 75 mg  75 mg Oral  Daily Shirley Friar, PA-C   75 mg at 03/31/18 1224  . docusate (COLACE) 50 MG/5ML liquid 100 mg  100 mg Oral BID PRN Cherene Altes, MD   100 mg at 03/31/18 1126  . ezetimibe (ZETIA) tablet 10 mg  10 mg Oral QHS Joette Catching T, MD      . gabapentin (NEURONTIN) capsule 1,200 mg  1,200 mg Oral QHS Irene Pap N, DO   1,200 mg at 03/30/18 2318  . gabapentin (NEURONTIN) capsule 600 mg  600 mg Oral BID Irene Pap N, DO   600 mg at 03/31/18 8250  . hydrALAZINE (APRESOLINE) injection 10 mg  10 mg Intravenous Q1H PRN Bensimhon, Shaune Pascal, MD   10 mg at 03/29/18 0370  . insulin aspart (novoLOG) injection 0-15 Units  0-15 Units Subcutaneous TID WC Larey Dresser, MD   3 Units at 03/31/18 0901  . insulin aspart (novoLOG) injection 0-5 Units  0-5 Units Subcutaneous QHS Larey Dresser, MD   3 Units at 03/29/18 2235  . insulin glargine (LANTUS) injection 25 Units  25 Units Subcutaneous Q2200 Milagros Loll, MD   25 Units at 03/30/18 2319  . ipratropium-albuterol (DUONEB) 0.5-2.5 (3) MG/3ML nebulizer solution 3 mL  3 mL Nebulization Q4H PRN Milagros Loll, MD   3 mL at 03/27/18 1131  . levofloxacin (LEVAQUIN) IVPB 500 mg  500 mg Intravenous Q48H Rush Farmer, MD 100 mL/hr at 03/29/18 1400 500 mg at 03/29/18 1400  . metoprolol succinate (TOPROL-XL) 24 hr tablet 50 mg  50 mg Oral BID Larey Dresser, MD   50 mg at 03/31/18 0901  . multivitamin (PROSIGHT) tablet 1 tablet  1 tablet Oral Daily Milagros Loll, MD   1 tablet at 03/31/18 0900  . nitroGLYCERIN (NITROSTAT) SL tablet 0.4 mg  0.4 mg Sublingual Q5 min PRN Milagros Loll, MD   0.4 mg at 03/25/18 2010  . pantoprazole (PROTONIX) EC tablet 40 mg  40 mg Oral BID Cherene Altes, MD   40 mg at 03/31/18 1126  . promethazine (PHENERGAN) injection 6.25 mg  6.25 mg Intravenous Q6H PRN Cristal Generous, NP   6.25 mg at 03/28/18 0401  . rosuvastatin (CRESTOR) tablet 40 mg  40 mg Oral QHS Joette Catching T, MD      . sodium  chloride flush (NS) 0.9 % injection 10-40 mL  10-40 mL Intracatheter Q12H Rush Farmer, MD   10 mL at 03/31/18 0902  . sodium chloride flush (NS) 0.9 % injection 10-40 mL  10-40 mL Intracatheter PRN Rush Farmer, MD      . vitamin B-12 (CYANOCOBALAMIN) tablet 1,000 mcg  1,000 mcg Oral Daily Milagros Loll, MD   1,000 mcg at 03/31/18 0901     Discharge Medications: Please see discharge summary for a list of discharge medications.  Relevant Imaging Results:  Relevant Lab Results:   Additional Information SSN: [id number]  Eileen Stanford, LCSW

## 2018-03-31 NOTE — Progress Notes (Signed)
[name] [name]  [name] [name]  GHW:[id number] DOB: 1945-12-09 DOA: 03/25/2018 PCP: Derinda Late, MD    Brief Narrative:  73yo w/ a hx of asthma, obstructive sleep apnea not on CPAP, chronic hypoxia on 2 L nasal cannula, CAD status post CABG, diastolic CHF, diabetes mellitus type 2, peripheral arterial disease, and essential hypertension who came to the hospital with cough with elevated blood pressure. She was found to be severely hypoxic, requiring intubation initially. Hospital course complicated by severe thrombocytopenia concerning for ITP requiring IV steroids and IVIG. Eventually extubated 2/25. A TTE noted ejection fraction 25-30%  Subjective: The patient is awake but confused this morning.  She denies chest pain shortness breath fevers or chills.  She appears to be resting comfortably.  There is no evidence of acute distress or uncontrolled pain.  Assessment & Plan:  Acute newly diagnosed systolic CHF TTE noted EF 25-30% - TTE a year ago noted EF 55-60% - CHF [name] directing care of this issue - Takotsubo v/s ischemic - will need eventual CAD eval - currently having difficulty balancing renal fxn w/ need for diuresis   Acute hypoxic resp failure  Pulmonary edema + PNA - still requiring HFNC - wean as able   Thrombocytopenia, suspect ITP Platelets improving after 2 doses of IVIG - Hematology following  Acute metabolic encephalopathy Delirium versus underlying infection versus steroid-induced - CT head unremarkable - likely multifactorial - follow   Community-acquired pneumonia complete course of empiric abx   Diabetes mellitus type 2; uncontrolled  w/ Peripheral neuropathy  CBG reasonably controlled - follow w/o change today  Chronic kidney disease stage III baseline creatinine 1.7 - crt climbing w/ attempts to diurese - hold diuretic for now   CAD cath 9/19 with severe native CAD and LIMA patent, sequential SVG-PLOM/OM1 with 80% proximal stenosis,  s/p DES, SVG to RCA chronically occluded  Paroxysmal atrial fibrillation Consider resuming anticoagulation once platelets have improved - cont amiodarone  HTN BP well controlled   Hypokalemia Due to diuretic + poor intake - supplement   Obesity - Body mass index is 49.12 kg/m.   DVT prophylaxis: SCDs Code Status: FULL CODE Family Communication: no family present at time of exam  Disposition Plan: SDU  Consultants:  CHF [name]  Hematology PCCM  Antimicrobials:  Aztreonam 2/22 Levaquin 2/22 > Vancomycin 2/23 Cefepime 2/23  Objective: Blood pressure (!) 120/51, pulse (!) 57, temperature (!) 97.2 F (36.2 C), temperature source Oral, resp. rate 12, height 5' 2.5" (1.588 m), weight 123.8 kg, SpO2 100 %.  Intake/Output Summary (Last 24 hours) at 03/31/2018 0827 Last data filed at 03/30/2018 2320 Gross per 24 hour  Intake 250 ml  Output -  Net 250 ml   Filed Weights   03/29/18 0555 03/30/18 0431 03/31/18 0319  Weight: 120.8 kg 126.2 kg 123.8 kg    Examination: General: No acute respiratory distress Lungs: poor air movement th/o - no wheezing  Cardiovascular: Regular rate and rhythm without murmur gallop or rub normal S1 and S2 - distant HS  Abdomen: Nontender, overweight, soft, bowel sounds positive, no rebound, no ascites, no appreciable mass Extremities: No significant cyanosis, clubbing, or edema bilateral lower extremities  CBC: Recent Labs  Lab 03/25/18 1630  03/29/18 0518 03/30/18 0309 03/31/18 0320  WBC 9.4   < > 13.9* 15.2* 11.1*  NEUTROABS 8.1*  --   --   --   --   HGB 13.4   < > 13.1 12.8 12.5  HCT  43.9   < > 43.3 41.2 41.8  MCV 101.4*   < > 98.6 98.8 99.5  PLT 104*   < > 54* 74* 81*   < > = values in this interval not displayed.   Basic Metabolic Panel: Recent Labs  Lab 03/27/18 0434  03/28/18 0512  03/29/18 0518 03/29/18 1645 03/30/18 0309 03/31/18 0320  NA 141   < > 141   < > 139 136 136 137  K 3.3*   < > 3.1*   < > 2.9* 3.5 3.2* 3.3*   CL 92*  --  88*   < > 80* 81* 81* 81*  CO2 35*  --  42*   < > 45* 42* 42* 45*  GLUCOSE 120*  --  138*   < > 283* 339* 188* 188*  BUN 23  --  30*   < > 38* 47* 55* 76*  CREATININE 1.71*  --  2.11*   < > 1.79* 1.86* 1.88* 2.81*  CALCIUM 8.2*  --  8.3*   < > 8.4* 9.0 9.0 9.0  MG 1.8  --  2.1  --  2.1  --   --   --   PHOS 2.9  --  2.1*  --  4.0  --   --   --    < > = values in this interval not displayed.   GFR: Estimated Creatinine Clearance: 23.6 mL/min (A) (by C-G formula based on SCr of 2.81 mg/dL (H)).  Liver Function Tests: Recent Labs  Lab 03/25/18 1630  AST 38  ALT 22  ALKPHOS 72  BILITOT 1.5*  PROT 7.3  ALBUMIN 3.9   Recent Labs  Lab 03/25/18 1630  LIPASE 19    Coagulation Profile: Recent Labs  Lab 03/25/18 1630 03/28/18 1046  INR 1.60 1.5*    Cardiac Enzymes: Recent Labs  Lab 03/25/18 1630 03/25/18 2230 03/26/18 0522  TROPONINI 0.05* 0.40* 0.48*    HbA1C: Hgb A1c MFr Bld  Date/Time Value Ref Range Status  10/21/2017 05:12 AM 8.0 (H) 4.8 - 5.6 % Final    Comment:    (NOTE) Pre diabetes:          5.7%-6.4% Diabetes:              >6.4% Glycemic control for   <7.0% adults with diabetes   07/18/2013 08:16 PM 7.2 (H) <5.7 % Final    Comment:    (NOTE)                                                                       According to the ADA Clinical Practice Recommendations for 2011, when HbA1c is used as a screening test:  >=6.5%   Diagnostic of Diabetes Mellitus           (if abnormal result is confirmed) 5.7-6.4%   Increased risk of developing Diabetes Mellitus References:Diagnosis and Classification of Diabetes Mellitus,Diabetes ZOXW,9604,54(UJWJX 1):S62-S69 and Standards of Medical Care in         Diabetes - 2011,Diabetes Care,2011,34 (Suppl 1):S11-S61.    CBG: Recent Labs  Lab 03/30/18 0843 03/30/18 1306 03/30/18 1647 03/30/18 2140 03/31/18 0758  GLUCAP 184* 200* 214* 193* 155*    Recent Results (from the past 240 hour(s))  Urine culture     Status: Abnormal   Collection Time: 03/25/18  4:18 PM  Result Value Ref Range Status   Specimen Description URINE, RANDOM  Final   Special Requests   Final    NONE Performed at [facility], [place] [address], [place], [place] [address]    Culture 10,000 COLONIES/mL ESCHERICHIA COLI (A)  Final   Report Status 03/27/2018 FINAL  Final   Organism ID, Bacteria ESCHERICHIA COLI (A)  Final      Susceptibility   Escherichia coli - MIC*    AMPICILLIN <=2 SENSITIVE Sensitive     CEFAZOLIN <=4 SENSITIVE Sensitive     CEFTRIAXONE <=1 SENSITIVE Sensitive     CIPROFLOXACIN <=0.25 SENSITIVE Sensitive     GENTAMICIN <=1 SENSITIVE Sensitive     IMIPENEM <=0.25 SENSITIVE Sensitive     NITROFURANTOIN <=16 SENSITIVE Sensitive     TRIMETH/SULFA <=20 SENSITIVE Sensitive     AMPICILLIN/SULBACTAM <=2 SENSITIVE Sensitive     PIP/TAZO <=4 SENSITIVE Sensitive     Extended ESBL NEGATIVE Sensitive     * 10,000 COLONIES/mL ESCHERICHIA COLI  Culture, blood (routine x 2)     Status: None   Collection Time: 03/25/18  4:18 PM  Result Value Ref Range Status   Specimen Description BLOOD RIGHT ANTECUBITAL  Final   Special Requests   Final    BOTTLES DRAWN AEROBIC AND ANAEROBIC Blood Culture results may not be optimal due to an inadequate volume of blood received in culture bottles   Culture   Final    NO GROWTH 5 DAYS Performed at [facility], [place] [address], [place], [place] [address]    Report Status 03/30/2018 FINAL  Final  Culture, blood (routine x 2)     Status: None   Collection Time: 03/25/18  4:23 PM  Result Value Ref Range Status   Specimen Description BLOOD LEFT ANTECUBITAL  Final   Special Requests   Final    AEROBIC BOTTLE ONLY Blood Culture results may not be optimal due to an inadequate volume of blood received in culture bottles   Culture   Final    NO GROWTH 5 DAYS Performed at [facility], [place] [address] [place], [place] [address]    Report Status  03/30/2018 FINAL  Final  MRSA PCR Screening     Status: None   Collection Time: 03/26/18 12:00 AM  Result Value Ref Range Status   MRSA by PCR NEGATIVE NEGATIVE Final    Comment:        The GeneXpert MRSA Assay (FDA approved for NASAL specimens only), is one component of a comprehensive MRSA colonization surveillance program. It is not intended to diagnose MRSA infection nor to guide or monitor treatment for MRSA infections. Performed at [facility], [place] [address], [place], [place] [address]   Culture, respiratory (non-expectorated)     Status: None   Collection Time: 03/27/18  8:46 AM  Result Value Ref Range Status   Specimen Description TRACHEAL ASPIRATE  Final   Special Requests NONE  Final   Gram Stain   Final    ABUNDANT WBC PRESENT, PREDOMINANTLY PMN NO ORGANISMS SEEN    Culture   Final    FEW Consistent with normal respiratory flora. Performed at [facility], [place] [address], [place], [place] [address]    Report Status 03/29/2018 FINAL  Final     Scheduled Meds: . amiodarone  100 mg Per NG tube Daily  .  amLODipine  10 mg Oral Daily  . arformoterol  15 mcg Nebulization BID  . budesonide (PULMICORT) nebulizer solution  0.25 mg Nebulization BID  . buPROPion  75 mg Oral BID  . chlorhexidine gluconate (MEDLINE KIT)  15 mL Mouth Rinse BID  . Chlorhexidine Gluconate Cloth  6 each Topical Daily  . cholecalciferol  2,000 Units Oral Q breakfast  . clopidogrel  75 mg Oral Daily  . ezetimibe  10 mg Per Tube QHS  . gabapentin  1,200 mg Oral QHS  . gabapentin  600 mg Oral BID  . insulin aspart  0-15 Units Subcutaneous TID WC  . insulin aspart  0-5 Units Subcutaneous QHS  . insulin glargine  25 Units Subcutaneous Q2200  . metoprolol succinate  50 mg Oral BID  . multivitamin  1 tablet Oral Daily  . pantoprazole (PROTONIX) IV  40 mg Intravenous Q12H  . rosuvastatin  40 mg Per NG tube QHS  . sodium chloride flush  10-40 mL Intracatheter Q12H  . spironolactone   25 mg Oral Daily  . torsemide  60 mg Oral BID  . vitamin B-12  1,000 mcg Oral Daily   Continuous Infusions: . sodium chloride Stopped (03/29/18 0629)  . levofloxacin (LEVAQUIN) IV 500 mg (03/29/18 1400)     LOS: 6 days   Cherene Altes, MD [facility] Office  [phone] Pager - Text Page per Amion  If 7PM-7AM, please contact night-coverage per Amion 03/31/2018, 8:27 AM

## 2018-03-31 NOTE — Progress Notes (Signed)
Labs from today reviewed. Platelet count up to 81,000. Received 1 unit of platelets on 03/28/2018 and IVIG 1 mg/kg X 2 doses (03/28/2018 and 03/29/2018). Patient sleeping quietly. No family at bedside. Will follow PRN. Please call over the weekend for questions.  Mikey Bussing, DNP, AGPCNP-BC, AOCNP

## 2018-03-31 NOTE — Progress Notes (Signed)
Nutrition Follow Up  DOCUMENTATION CODES:   Morbid obesity  INTERVENTION:    Glucerna Shake po BID, each supplement provides 220 kcal and 10 grams of protein  NEW NUTRITION DIAGNOSIS:   Increased nutrient needs related to acute illness as evidenced by estimated needs, ongoing  GOAL:   Patient will meet greater than or equal to 90% of their needs, progressing   MONITOR:   PO intake, Supplement acceptance, Labs, Skin, Weight trends  ASSESSMENT:   Patient with PMH significant for OSA, on home o2, CAD s/p CABG and multiple PCI, CHF s/p cardioMEMS, cerebrovascular disease, HTN, CKD, DM, and PAD. Presents this admission with possible CAP, hypertensive urgency, and acute exacerbation of CHF.    Pt extubated 2/25; transferred out of 2H-ICU. Bedside swallow completed 2/26; pt advanced to Regular diet. Pt is very sleepy upon RD visit today; spoke with Marita [place], Therapist, sports.  Pt has been lethargic; PO intake has been suboptimal. Pt does drink a little better than eating solid food at this time. PO intake variable at 45-50% per flowsheet records.  Would benefit from addition of a nutrition supplement. Labs & medications reviewed. K 3.3 (L). CBG's [phone].  Diet Order:   Diet Order            Diet regular Room service appropriate? Yes; Fluid consistency: Thin  Diet effective now             EDUCATION NEEDS:   Not appropriate for education at this time  Skin:  Skin Assessment: Skin Integrity Issues: Skin Integrity Issues:: Other (Comment) Other: MASD- abdomen perineum  Last BM:  2/22  Height:   Ht Readings from Last 1 Encounters:  03/25/18 5' 2.5" (1.588 m)   Weight:   Wt Readings from Last 1 Encounters:  03/31/18 123.8 kg   Ideal Body Weight:  52.3 kg  BMI:  Body mass index is 49.12 kg/m.  Estimated Nutritional Needs:   Kcal:  1600-1800  Protein:  90-105 gm  Fluid:  1.6-1.8 L  Arthur Holms, RD, LDN Pager #: [phone] After-Hours Pager #: [phone]

## 2018-03-31 NOTE — Progress Notes (Signed)
Physical Therapy Treatment Patient Details Name: [name] [name] [name]: [id number] DOB: October 16, 1945 Today's Date: 03/31/2018    History of Present Illness Ms. Hasten is a 73 year old woman with history of asthma, OSA not on CPAP, chronic hypoxic respiratory failure on 2L at night, CAD s/p CABG and multiple PCI, HFpEF s/p CardioMEMS, AF on Eliquis, cerebrovascular disease, HTN, CKD, DM2, PAD presenting with cough found in the ED to have fever, hypertensive urgency, and acute on chronic hypoxic respiratory failure. intubated 2/22-2/25    PT Comments    Patient making good progress this visit. Cognition improving greatly and pt very thankful to be moving out of bed. Transferred to chair with several standing trials from EOB. Min A for all mobility at this time. VSS on 2L.      Follow Up Recommendations  SNF     Equipment Recommendations  Rolling walker with 5" wheels    Recommendations for Other Services       Precautions / Restrictions Precautions Precautions: Fall Precaution Comments: check SpO2 Restrictions Weight Bearing Restrictions: No    Mobility  Bed Mobility Overal bed mobility: Needs Assistance Bed Mobility: Rolling;Supine to Sit     Supine to sit: Min assist     General bed mobility comments: patient with min A t come to sitting today to power trunk p, able to bring legs over bed on her own.  Transfers Overall transfer level: Needs assistance Equipment used: Rolling walker (2 wheeled) Transfers: Sit to/from [facility] Sit to Stand: Min assist Stand pivot transfers: Min assist       General transfer comment: Min A today from slightly eleveated bed, x2 trials, 1st she stood 30 seconds then sat back on bed. 2nd trial pivot to chair with contact guard mostly.   Ambulation/Gait             General Gait Details: deferred due to weakness and buckeling/balance    Stairs             Wheelchair Mobility    Modified Rankin (Stroke  Patients Only)       Balance Overall balance assessment: Needs assistance Sitting-balance support: Feet supported Sitting balance-Leahy Scale: Fair Sitting balance - Comments: close supervision   Standing balance support: During functional activity Standing balance-Leahy Scale: Poor                              Cognition Arousal/Alertness: Awake/alert                                     General Comments: cogntition greatly improving today, more approprioate and falling all simple and dynamic commands.       Exercises      General Comments        Pertinent Vitals/Pain Pain Assessment: No/denies pain    Home Living                      Prior Function            PT Goals (current goals can now be found in the care plan section) Acute Rehab PT Goals Patient Stated Goal: none stated PT Goal Formulation: With patient Time For Goal Achievement: 04/12/18 Potential to Achieve Goals: Fair Progress towards PT goals: Progressing toward goals    Frequency    Min 3X/week      PT  Plan Current plan remains appropriate    Co-evaluation              AM-PAC PT "6 Clicks" Mobility   Outcome Measure  Help needed turning from your back to your side while in a flat bed without using bedrails?: A Little Help needed moving from lying on your back to sitting on the side of a flat bed without using bedrails?: A Little Help needed moving to and from a bed to a chair (including a wheelchair)?: A Lot Help needed standing up from a chair using your arms (e.g., wheelchair or bedside chair)?: A Lot Help needed to walk in hospital room?: Total Help needed climbing 3-5 steps with a railing? : Total 6 Click Score: 12    End of Session Equipment Utilized During Treatment: Oxygen Activity Tolerance: Other (comment) Patient left: in bed;with family/visitor present Nurse Communication: Mobility status PT Visit Diagnosis: Unsteadiness on feet  (R26.81)     Time: 1455-1520 PT Time Calculation (min) (ACUTE ONLY): 25 min  Charges:  $Therapeutic Activity: 23-37 mins                    Reinaldo Berber, PT, DPT Acute Rehabilitation Services Pager: [phone] Office: [phone]     Reinaldo Berber 03/31/2018, 3:35 PM

## 2018-03-31 NOTE — Clinical Social Work Note (Signed)
CSW received a call from pt's son stating family would like pt to go to [facility]. Referral sent and CSW confirmed with admission they had received it. Admission will review.   [place], [place]

## 2018-03-31 NOTE — Clinical Social Work Note (Signed)
Clinical Social Work Assessment  Patient Details  Name: [name] [name] [name]: [id number] Date of Birth: April 22, 1945  Date of referral:  03/31/18               Reason for consult:  Facility Placement                Permission sought to share information with:  Chartered certified accountant granted to share information::  Yes, Verbal Permission Granted  Name::     Darla Lesches::  [facility]  Relationship::  Spouse  Contact Information:     Housing/Transportation Living arrangements for the past 2 months:  Single Family Home Source of Information:  Spouse Patient Interpreter Needed:  None Criminal Activity/Legal Involvement Pertinent to Current Situation/Hospitalization:  No - Comment as needed Significant Relationships:  Spouse Lives with:  Spouse Do you feel safe going back to the place where you live?  Yes Need for family participation in patient care:  No (Coment)  Care giving concerns:  Pt is disoriented to situation.   Social Worker assessment / plan:  CSW spoke with pt's spouse. Pt's spouse states he will talk it over with pt but he believes if she will agree to go that she would want to go to [facility]. Pt's spouse states that's where all of their family has gone for rehab. CSW waiting to hear back from pt's spouse.  Employment status:  Retired Forensic scientist:  Medicare PT Recommendations:  [place] / Referral to community resources:  [place]  Patient/Family's Response to care:  Pt's spouse verbalized understanding of CSW role and expressed appreciation for support. Pt's spouse denies any concern regarding pt care at this time.   Patient/Family's Understanding of and Emotional Response to Diagnosis, Current Treatment, and Prognosis:  Pt's spouse understanding and realistic regarding pt's physical limitations. Pt's spouse understands the need for SNF placement at d/c. Pt's spouse denies  any concern regarding pt's treatment plan at this time. CSW will continue to provide support and facilitate d/c needs.   Emotional Assessment Appearance:  Appears stated age Attitude/Demeanor/Rapport:  Unable to Assess Affect (typically observed):  Unable to Assess Orientation:  Oriented to Self, Oriented to Place, Oriented to  Time Alcohol / Substance use:  Not Applicable Psych involvement (Current and /or in the community):  No (Comment)  Discharge Needs  Concerns to be addressed:  Basic Needs, Care Coordination Readmission within the last 30 days:  No Current discharge risk:  Dependent with Mobility Barriers to Discharge:  Continued Medical Work up   [employer], LCSW 03/31/2018, 11:53 AM

## 2018-03-31 NOTE — Progress Notes (Addendum)
Patient ID: [name] [name], female   DOB: 17-Sep-1945, 73 y.o.   [name]: [id number]    Advanced Heart Failure Rounding Note   Subjective:    Admitted by CCM with fever, cough and respiratory failure requiring intubation.  Flu swab and UA negative. She was started on empiric abx, now on levofloxacin only. Afebrile. Blood cx NGTD. WBC improving.   Husband reports she was unable to take torsemide for several days as she was throwing up. Fluid accumulated quickly.   Initially intubated, extubated 2/25.  CVP ~7 this morning.  Creatinine 1.88 to 2.81 today. Weight down 6 lbs overnight. She already received torsemide and spiro this am.  CXR 2/26 with suspected multilobar PNA.   Platelets dropped to <5 on 2/25.  ?ITP related to infection.  TTP unlikely, no schistocytes/evidence for hemolysis.  She has had 1 dose of solumedrol and 2 doses of IVIG as well as 2 units plts on 2/25.  Plts up to 81K today.   Delirium is ongoing.  CT head 2/26 showed no acute abnormality.  She is drowsy, but oriented this morning. Says she feels a little better. Denies SOB. Feels weak.   Echo EF 25-30% with global HK worse from mid-ventricle to apex. RV moderately down. (Echo 1/19 EF 55-60%)  Objective:   Weight Range:  Vital Signs:   Temp:  [97.2 F (36.2 C)-98.7 F (37.1 C)] 97.2 F (36.2 C) (02/28 0801) Pulse Rate:  [57-76] 57 (02/28 0801) Resp:  [12-21] 12 (02/28 0801) BP: (96-154)/(51-88) 120/51 (02/28 0801) SpO2:  [94 %-100 %] 100 % (02/28 0801) Weight:  [123.8 kg] 123.8 kg (02/28 0319) Last BM Date: 03/25/18  Weight change: Filed Weights   03/29/18 0555 03/30/18 0431 03/31/18 0319  Weight: 120.8 kg 126.2 kg 123.8 kg    Intake/Output:   Intake/Output Summary (Last 24 hours) at 03/31/2018 0924 Last data filed at 03/30/2018 2320 Gross per 24 hour  Intake 250 ml  Output -  Net 250 ml     Physical Exam: General: No resp difficulty. HEENT: Normal Neck: Supple. JVP difficult. Carotids 2+ bilat; no  bruits. No thyromegaly or nodule noted. Cor: PMI nondisplaced. RRR, No M/G/R noted Lungs: clear, distant. On 4 L HFNC.  Abdomen: Soft, non-tender, non-distended, no HSM. No bruits or masses. +BS  Extremities: No cyanosis, clubbing, or rash. R and LLE no edema. RUE PICC line with small amount of blood at PICC site. Scattered bruising over BUE. Neuro: Alert & orientedx3, cranial nerves grossly intact. moves all 4 extremities w/o difficulty. Affect pleasant   Telemetry: Sinus brady 50s. Personally reviewed.     Labs: Basic Metabolic Panel: Recent Labs  Lab 03/25/18 1630  03/26/18 0522 03/27/18 0434  03/28/18 0512 03/28/18 1535 03/29/18 0518 03/29/18 1645 03/30/18 0309 03/31/18 0320  NA 138   < > 141 141   < > 141 140 139 136 136 137  K 4.1   < > 4.9 3.3*   < > 3.1* 3.6 2.9* 3.5 3.2* 3.3*  CL 101  --  101 92*  --  88* 85* 80* 81* 81* 81*  CO2 26  --  22 35*  --  42* 40* 45* 42* 42* 45*  GLUCOSE 166*  --  135* 120*  --  138* 247* 283* 339* 188* 188*  BUN 15  --  22 23  --  30* 31* 38* 47* 55* 76*  CREATININE 1.34*  --  1.68* 1.71*  --  2.11* 2.05* 1.79* 1.86* 1.88* 2.81*  CALCIUM 9.1  --  8.5* 8.2*  --  8.3* 8.6* 8.4* 9.0 9.0 9.0  MG 2.0  --  2.1 1.8  --  2.1  --  2.1  --   --   --   PHOS 2.8  --  3.6 2.9  --  2.1*  --  4.0  --   --   --    < > = values in this interval not displayed.    Liver Function Tests: Recent Labs  Lab 03/25/18 1630  AST 38  ALT 22  ALKPHOS 72  BILITOT 1.5*  PROT 7.3  ALBUMIN 3.9   Recent Labs  Lab 03/25/18 1630  LIPASE 19   No results for input(s): AMMONIA in the last 168 hours.  CBC: Recent Labs  Lab 03/25/18 1630  03/28/18 0820 03/28/18 1046 03/28/18 1500 03/29/18 0518 03/30/18 0309 03/31/18 0320  WBC 9.4   < > 13.3*  --  16.0* 13.9* 15.2* 11.1*  NEUTROABS 8.1*  --   --   --   --   --   --   --   HGB 13.4   < > 12.6  --  12.3 13.1 12.8 12.5  HCT 43.9   < > 40.0  --  40.8 43.3 41.2 41.8  MCV 101.4*   < > 99.5  --  100.5* 98.6  98.8 99.5  PLT 104*   < > <5* <5* 23* 54* 74* 81*   < > = values in this interval not displayed.    Cardiac Enzymes: Recent Labs  Lab 03/25/18 1630 03/25/18 2230 03/26/18 0522  TROPONINI 0.05* 0.40* 0.48*    BNP: BNP (last 3 results) Recent Labs    01/16/18 2150 03/25/18 1630  BNP 701.3* 1,480.1*    ProBNP (last 3 results) No results for input(s): PROBNP in the last 8760 hours.    Other results:  Imaging: Ct Head Wo Contrast  Result Date: 03/29/2018 CLINICAL DATA:  Altered mental status EXAM: CT HEAD WITHOUT CONTRAST TECHNIQUE: Contiguous axial images were obtained from the base of the skull through the vertex without intravenous contrast. COMPARISON:  None. FINDINGS: Brain: There is an old right occipital lobe infarct. No acute hemorrhage, mass or extra-axial collection. There is periventricular hypoattenuation compatible with chronic microvascular disease. Parenchymal volume is normal for age. Vascular: No abnormal hyperdensity of the major intracranial arteries or dural venous sinuses. No intracranial atherosclerosis. Skull: The visualized skull base, calvarium and extracranial soft tissues are normal. Sinuses/Orbits: The patient is intubated. There is fluid within the sphenoid sinuses. Mild ethmoid sinus mucosal thickening. The orbits are normal. IMPRESSION: Old right occipital lobe infarct and chronic small vessel disease without acute intracranial abnormality. Electronically Signed   By: Ulyses Jarred M.D.   On: 03/29/2018 13:35     Medications:     Scheduled Medications: . [START ON 04/01/2018] amiodarone  100 mg Oral Daily  . amLODipine  10 mg Oral Daily  . arformoterol  15 mcg Nebulization BID  . budesonide (PULMICORT) nebulizer solution  0.25 mg Nebulization BID  . buPROPion  75 mg Oral BID  . chlorhexidine gluconate (MEDLINE KIT)  15 mL Mouth Rinse BID  . Chlorhexidine Gluconate Cloth  6 each Topical Daily  . cholecalciferol  2,000 Units Oral Q breakfast  .  clopidogrel  75 mg Oral Daily  . ezetimibe  10 mg Oral QHS  . gabapentin  1,200 mg Oral QHS  . gabapentin  600 mg Oral BID  . insulin aspart  0-15 Units Subcutaneous TID WC  . insulin aspart  0-5 Units Subcutaneous QHS  . insulin glargine  25 Units Subcutaneous Q2200  . metoprolol succinate  50 mg Oral BID  . multivitamin  1 tablet Oral Daily  . pantoprazole  40 mg Oral BID  . potassium chloride  40 mEq Oral Once  . rosuvastatin  40 mg Oral QHS  . sodium chloride flush  10-40 mL Intracatheter Q12H  . spironolactone  25 mg Oral Daily  . torsemide  60 mg Oral BID  . vitamin B-12  1,000 mcg Oral Daily    Infusions: . sodium chloride Stopped (03/29/18 0629)  . levofloxacin (LEVAQUIN) IV 500 mg (03/29/18 1400)    PRN Medications: sodium chloride, acetaminophen, ALPRAZolam, bisacodyl, docusate, hydrALAZINE, ipratropium-albuterol, nitroGLYCERIN, promethazine, sodium chloride flush   Assessment:   ANNALY SKOP is a 73 y.o. female with multiple medical problems including a hx of CAD s/p CABG and multiple PCI, HFpEF s/p CardioMEMS, AF, cerebrovascular disease, CKD, DM2, PAD, admitted 2/22 with acute respiratory failure requiring mechanical intubation in the setting of URI with fever to 102 and cough.   Plan/Discussion:    1. Acute delirium: Suspect multifactorial.  She got a high dose steroid bolus on 2/25 and has had PNA/hypoxemia. CT head showed no acute abnormalities.  She is drowsy, but appropriate today.  - Avoid further steroids.  She is much clearer today, suspect delirium was related to high dose steroids.  2. Acute hypoxemic respiratory failure: Suspect combination of pulmonary edema and likely PNA.  Initially intubated, now extubated.  CXR with diffuse bilateral airspace disease and PCT 3.08=>2.39. CVP 7-8 - Hold further torsemide with AKI.  - Continue levofloxacin primary service for CAP. She is afebrile.  3. ID: Suspect PNA.  Initial fever to 102.6, now afebrile.  WBCs 13.5  => 12.6 => 13.9 => 15 => 11.1 - Continue levofloxacin.   4. Acute systolic CHF: Echo with EF 25-30%, severe hypokinesis-akinesis of the mid-apical LV myocardium. Prior echo with normal EF.  ?Takotsubo event with stress of acute illness.  However, she has known extensive coronary disease as well.  Good Co-ox at 75%, CVP 7-8 still.  Creatinine much worse. - Hold torsemide with AKI. Creatinine up to 2.8 today. She received spiro and torsemide already today.  - Hold spiro with AKI - No ARB/ACEI/ARNI until creatinine back to baseline.    - Continue Toprol XL 50 mg bid.   - When creatinine and plts stabilize, will consider cardiac cath but no plans for now.  4. AKI on CKD: stage 3, baseline 1.4-1.8.  - Creatinine up to 2.8. Received torsemide and spiro this am. Will hold for now.  5. CAD: Last cath 9/19 with severe native CAD and LIMA patent. Sequential SVG-PLOM/OM1 with 80% proximal stenosis, s/p DES. SVG to RCA chronically occluded. No CP.  Mild elevation in troponin with no trend, likely demand ischemia. Echo with newly low EF, may be Takotsubo.  - Continue statin.  - Plavix restarted 2/26 with plts rising.  - Possible eventual cath with fall in EF.  6. Atrial fibrillation: Paroxysmal.  Remain sin NSR. Off anticoagulation with fall in platelets, will eventually need to restart Eliquis 5 mg bid (will wait until plts > 100K as she is also on Plavix).  7. Thrombocytopenia: ?ITP triggered by acute infection.  Doubt TTP, no schistocytes/evidence for hemolysis.  HIT negative.  Got 2 units plts 2/25.  Given a dose of Solumedrol and 2 doses IVIG.  Plts  now up to 81K.  - Hematology following.  8. HTN: SBP 90-120s now.  9. Continue to work with PT. Recommending SNF.  Addendum: Spoke with Dr [name] about AKI. Will give 250 cc bolus. Hold torsemide and spiro for now. With soft BPs, will also stop amlodipine (already received today).  Georgiana Shore, NP 03/31/2018 9:24 AM  Patient seen with NP, agree with  the above note.   Mrs Bently is much clearer this morning, suspect delirium was due to high dose steroids.    Platelets up to 81K.  She is already back on Plavix.  Restart Eliquis over the weekend if plts continue to rise.    AKI with creatinine up to 2.8. CVP 7-8.  Unfortunately, she got torsemide dose early this morning.  - Stop torsemide and spironolactone.  - Will give some IVF back (250 cc).  - Follow BMET in am.   Continue abx for PNA.   Loralie Champagne 03/31/2018 1:12 PM

## 2018-04-01 ENCOUNTER — Inpatient Hospital Stay (HOSPITAL_COMMUNITY): Payer: Medicare Other

## 2018-04-01 LAB — CBC WITH DIFFERENTIAL/PLATELET
Abs Immature Granulocytes: 0.35 10*3/uL — ABNORMAL HIGH (ref 0.00–0.07)
Basophils Absolute: 0.1 10*3/uL (ref 0.0–0.1)
Basophils Relative: 0 %
Eosinophils Absolute: 0.2 10*3/uL (ref 0.0–0.5)
Eosinophils Relative: 2 %
HCT: 44.1 % (ref 36.0–46.0)
Hemoglobin: 13.5 g/dL (ref 12.0–15.0)
Immature Granulocytes: 2 %
Lymphocytes Relative: 6 %
Lymphs Abs: 0.9 10*3/uL (ref 0.7–4.0)
MCH: 29.5 pg (ref 26.0–34.0)
MCHC: 30.6 g/dL (ref 30.0–36.0)
MCV: 96.3 fL (ref 80.0–100.0)
Monocytes Absolute: 1.1 10*3/uL — ABNORMAL HIGH (ref 0.1–1.0)
Monocytes Relative: 7 %
Neutro Abs: 12.3 10*3/uL — ABNORMAL HIGH (ref 1.7–7.7)
Neutrophils Relative %: 83 %
Platelets: 117 10*3/uL — ABNORMAL LOW (ref 150–400)
RBC: 4.58 MIL/uL (ref 3.87–5.11)
RDW: 13 % (ref 11.5–15.5)
WBC: 14.9 10*3/uL — ABNORMAL HIGH (ref 4.0–10.5)
nRBC: 0 % (ref 0.0–0.2)

## 2018-04-01 LAB — BASIC METABOLIC PANEL
Anion gap: 10 (ref 5–15)
Anion gap: 15 (ref 5–15)
Anion gap: 9 (ref 5–15)
BUN: 93 mg/dL — ABNORMAL HIGH (ref 8–23)
BUN: 90 mg/dL — ABNORMAL HIGH (ref 8–23)
BUN: 92 mg/dL — ABNORMAL HIGH (ref 8–23)
CO2: 42 mmol/L — ABNORMAL HIGH (ref 22–32)
CO2: 38 mmol/L — ABNORMAL HIGH (ref 22–32)
CO2: 41 mmol/L — ABNORMAL HIGH (ref 22–32)
Calcium: 8.7 mg/dL — ABNORMAL LOW (ref 8.9–10.3)
Calcium: 8.9 mg/dL (ref 8.9–10.3)
Calcium: 8.9 mg/dL (ref 8.9–10.3)
Chloride: 82 mmol/L — ABNORMAL LOW (ref 98–111)
Chloride: 83 mmol/L — ABNORMAL LOW (ref 98–111)
Chloride: 85 mmol/L — ABNORMAL LOW (ref 98–111)
Creatinine, Ser: 3.17 mg/dL — ABNORMAL HIGH (ref 0.44–1.00)
Creatinine, Ser: 2.94 mg/dL — ABNORMAL HIGH (ref 0.44–1.00)
Creatinine, Ser: 2.88 mg/dL — ABNORMAL HIGH (ref 0.44–1.00)
GFR calc Af Amer: 16 mL/min — ABNORMAL LOW (ref >60)
GFR calc Af Amer: 18 mL/min — ABNORMAL LOW (ref >60)
GFR calc Af Amer: 18 mL/min — ABNORMAL LOW (ref >60)
GFR calc non Af Amer: 14 mL/min — ABNORMAL LOW (ref >60)
GFR calc non Af Amer: 15 mL/min — ABNORMAL LOW (ref >60)
GFR calc non Af Amer: 16 mL/min — ABNORMAL LOW (ref >60)
Glucose, Bld: 209 mg/dL — ABNORMAL HIGH (ref 70–99)
Glucose, Bld: 275 mg/dL — ABNORMAL HIGH (ref 70–99)
Glucose, Bld: 280 mg/dL — ABNORMAL HIGH (ref 70–99)
Potassium: 3.3 mmol/L — ABNORMAL LOW (ref 3.5–5.1)
Potassium: 3.5 mmol/L (ref 3.5–5.1)
Potassium: 3.5 mmol/L (ref 3.5–5.1)
Sodium: 134 mmol/L — ABNORMAL LOW (ref 135–145)
Sodium: 136 mmol/L (ref 135–145)
Sodium: 135 mmol/L (ref 135–145)

## 2018-04-01 LAB — GLUCOSE, CAPILLARY
Glucose-Capillary: 150 mg/dL — ABNORMAL HIGH (ref 70–99)
Glucose-Capillary: 170 mg/dL — ABNORMAL HIGH (ref 70–99)
Glucose-Capillary: 201 mg/dL — ABNORMAL HIGH (ref 70–99)
Glucose-Capillary: 211 mg/dL — ABNORMAL HIGH (ref 70–99)
Glucose-Capillary: 265 mg/dL — ABNORMAL HIGH (ref 70–99)
Glucose-Capillary: 286 mg/dL — ABNORMAL HIGH (ref 70–99)

## 2018-04-01 LAB — COOXEMETRY PANEL
Carboxyhemoglobin: 1.8 % — ABNORMAL HIGH (ref 0.5–1.5)
Carboxyhemoglobin: 1.4 % (ref 0.5–1.5)
Methemoglobin: 1 % (ref 0.0–1.5)
Methemoglobin: 1.8 % — ABNORMAL HIGH (ref 0.0–1.5)
O2 Saturation: 70 %
O2 Saturation: 75.8 %
Total hemoglobin: 13.1 g/dL (ref 12.0–16.0)
Total hemoglobin: 12.6 g/dL (ref 12.0–16.0)

## 2018-04-01 LAB — POCT I-STAT 7, (LYTES, BLD GAS, ICA,H+H)
Acid-Base Excess: 25 mmol/L — ABNORMAL HIGH (ref 0.0–2.0)
Bicarbonate: 53.4 mmol/L — ABNORMAL HIGH (ref 20.0–28.0)
Calcium, Ion: 1.06 mmol/L — ABNORMAL LOW (ref 1.15–1.40)
HCT: 40 % (ref 36.0–46.0)
Hemoglobin: 13.6 g/dL (ref 12.0–15.0)
O2 Saturation: 100 %
Potassium: 5.2 mmol/L — ABNORMAL HIGH (ref 3.5–5.1)
Sodium: 133 mmol/L — ABNORMAL LOW (ref 135–145)
TCO2: >50 mmol/L — ABNORMAL HIGH (ref 22–32)
pCO2 arterial: 71.3 mmHg (ref 32.0–48.0)
pH, Arterial: 7.482 — ABNORMAL HIGH (ref 7.350–7.450)
pO2, Arterial: 365 mmHg — ABNORMAL HIGH (ref 83.0–108.0)

## 2018-04-01 LAB — CBC
HCT: 40.1 % (ref 36.0–46.0)
Hemoglobin: 12.5 g/dL (ref 12.0–15.0)
MCH: 30.3 pg (ref 26.0–34.0)
MCHC: 31.2 g/dL (ref 30.0–36.0)
MCV: 97.1 fL (ref 80.0–100.0)
Platelets: 93 10*3/uL — ABNORMAL LOW (ref 150–400)
RBC: 4.13 MIL/uL (ref 3.87–5.11)
RDW: 13.1 % (ref 11.5–15.5)
WBC: 12.2 10*3/uL — ABNORMAL HIGH (ref 4.0–10.5)
nRBC: 0 % (ref 0.0–0.2)

## 2018-04-01 LAB — PROCALCITONIN: Procalcitonin: 0.32 ng/mL

## 2018-04-01 LAB — STREP PNEUMONIAE URINARY ANTIGEN: Strep Pneumo Urinary Antigen: NEGATIVE

## 2018-04-01 LAB — MAGNESIUM
Magnesium: 2.3 mg/dL (ref 1.7–2.4)
Magnesium: 2.3 mg/dL (ref 1.7–2.4)

## 2018-04-01 LAB — LACTIC ACID, PLASMA: Lactic Acid, Venous: 1.1 mmol/L (ref 0.5–1.9)

## 2018-04-01 LAB — PHOSPHORUS: Phosphorus: 5.2 mg/dL — ABNORMAL HIGH (ref 2.5–4.6)

## 2018-04-01 LAB — TROPONIN I: Troponin I: 0.1 ng/mL (ref <0.03)

## 2018-04-01 MED ORDER — MIDAZOLAM HCL 2 MG/2ML IJ SOLN
1.0000 mg | INTRAMUSCULAR | Status: DC | PRN
  Administered 2018-04-01: 2 mg via INTRAVENOUS
  Administered 2018-04-02: 4 mg via INTRAVENOUS
  Administered 2018-04-04: 2 mg via INTRAVENOUS
  Filled 2018-04-01 (×2): qty 2
  Filled 2018-04-01: qty 4
  Filled 2018-04-01: qty 2

## 2018-04-01 MED ORDER — ETOMIDATE 2 MG/ML IV SOLN
20.0000 mg | Freq: Once | INTRAVENOUS | Status: AC
  Administered 2018-04-01: 20 mg via INTRAVENOUS

## 2018-04-01 MED ORDER — MIDAZOLAM HCL (PF) 5 MG/ML IJ SOLN: 1.0000 mg | INTRAMUSCULAR | Status: DC | PRN

## 2018-04-01 MED ORDER — INSULIN ASPART 100 UNIT/ML ~~LOC~~ SOLN
1.0000 [IU] | SUBCUTANEOUS | Status: DC
  Administered 2018-04-01: 2 [IU] via SUBCUTANEOUS
  Administered 2018-04-01 (×2): 3 [IU] via SUBCUTANEOUS
  Administered 2018-04-02 – 2018-04-03 (×6): 2 [IU] via SUBCUTANEOUS
  Administered 2018-04-03: 1 [IU] via SUBCUTANEOUS
  Administered 2018-04-03 (×2): 2 [IU] via SUBCUTANEOUS
  Administered 2018-04-03: 1 [IU] via SUBCUTANEOUS
  Administered 2018-04-04: 2 [IU] via SUBCUTANEOUS
  Administered 2018-04-04: 1 [IU] via SUBCUTANEOUS
  Administered 2018-04-04: 2 [IU] via SUBCUTANEOUS
  Administered 2018-04-04: 1 [IU] via SUBCUTANEOUS
  Administered 2018-04-04: 3 [IU] via SUBCUTANEOUS

## 2018-04-01 MED ORDER — ORAL CARE MOUTH RINSE
15.0000 mL | OROMUCOSAL | Status: DC
  Administered 2018-04-01 – 2018-04-03 (×19): 15 mL via OROMUCOSAL

## 2018-04-01 MED ORDER — NOREPINEPHRINE 4 MG/250ML-% IV SOLN
0.0000 ug/min | INTRAVENOUS | Status: DC
  Administered 2018-04-01: 5 ug/min via INTRAVENOUS
  Administered 2018-04-02: 24 ug/min via INTRAVENOUS
  Administered 2018-04-02: 32 ug/min via INTRAVENOUS
  Administered 2018-04-02 (×2): 14 ug/min via INTRAVENOUS
  Administered 2018-04-02: 28 ug/min via INTRAVENOUS
  Administered 2018-04-03: 10 ug/min via INTRAVENOUS
  Filled 2018-04-01 (×9): qty 250

## 2018-04-01 MED ORDER — FENTANYL 2500MCG IN NS 250ML (10MCG/ML) PREMIX INFUSION
25.0000 ug/h | INTRAVENOUS | Status: DC
  Administered 2018-04-01: 350 ug/h via INTRAVENOUS
  Administered 2018-04-01: 50 ug/h via INTRAVENOUS
  Administered 2018-04-02: 225 ug/h via INTRAVENOUS
  Administered 2018-04-02: 300 ug/h via INTRAVENOUS
  Administered 2018-04-03: 200 ug/h via INTRAVENOUS
  Administered 2018-04-03: 250 ug/h via INTRAVENOUS
  Filled 2018-04-01 (×7): qty 250

## 2018-04-01 MED ORDER — POTASSIUM CHLORIDE CRYS ER 20 MEQ PO TBCR: 20.0000 meq | EXTENDED_RELEASE_TABLET | Freq: Once | ORAL | Status: DC

## 2018-04-01 MED ORDER — IPRATROPIUM-ALBUTEROL 0.5-2.5 (3) MG/3ML IN SOLN
3.0000 mL | Freq: Four times a day (QID) | RESPIRATORY_TRACT | Status: DC
  Administered 2018-04-01 – 2018-04-04 (×12): 3 mL via RESPIRATORY_TRACT
  Filled 2018-04-01 (×13): qty 3

## 2018-04-01 MED ORDER — MIDAZOLAM HCL 2 MG/2ML IJ SOLN
2.0000 mg | Freq: Once | INTRAMUSCULAR | Status: AC
  Administered 2018-04-01: 2 mg via INTRAVENOUS

## 2018-04-01 MED ORDER — CHLORHEXIDINE GLUCONATE 0.12% ORAL RINSE (MEDLINE KIT)
15.0000 mL | Freq: Two times a day (BID) | OROMUCOSAL | Status: DC
  Administered 2018-04-01 – 2018-04-03 (×4): 15 mL via OROMUCOSAL

## 2018-04-01 MED ORDER — FENTANYL CITRATE (PF) 100 MCG/2ML IJ SOLN
100.0000 ug | Freq: Once | INTRAMUSCULAR | Status: AC
  Administered 2018-04-01: 100 ug via INTRAVENOUS

## 2018-04-01 MED ORDER — PANTOPRAZOLE SODIUM 40 MG PO PACK
40.0000 mg | PACK | Freq: Every day | ORAL | Status: DC
  Administered 2018-04-02 – 2018-04-03 (×2): 40 mg
  Filled 2018-04-01 (×2): qty 20

## 2018-04-01 MED ORDER — GABAPENTIN 400 MG PO CAPS: 400.0000 mg | ORAL_CAPSULE | Freq: Every day | ORAL | Status: DC

## 2018-04-01 MED ORDER — GABAPENTIN 100 MG PO CAPS: 100.0000 mg | ORAL_CAPSULE | Freq: Two times a day (BID) | ORAL | Status: DC

## 2018-04-01 MED ORDER — POTASSIUM CHLORIDE 20 MEQ/15ML (10%) PO SOLN
20.0000 meq | Freq: Once | ORAL | Status: AC
  Administered 2018-04-01: 20 meq via ORAL
  Filled 2018-04-01: qty 15

## 2018-04-01 MED ORDER — ROCURONIUM BROMIDE 50 MG/5ML IV SOLN
5.0000 mg/kg | Freq: Once | INTRAVENOUS | Status: AC
  Administered 2018-04-01: 626.5 mg via INTRAVENOUS
  Filled 2018-04-01: qty 62.65

## 2018-04-01 MED ORDER — POTASSIUM CHLORIDE 20 MEQ/15ML (10%) PO SOLN: 20.0000 meq | Freq: Once | ORAL | Status: DC

## 2018-04-01 MED ORDER — FENTANYL CITRATE (PF) 100 MCG/2ML IJ SOLN
50.0000 ug | Freq: Once | INTRAMUSCULAR | Status: AC
  Administered 2018-04-01: 50 ug via INTRAVENOUS

## 2018-04-01 MED ORDER — LACTATED RINGERS IV BOLUS
500.0000 mL | Freq: Once | INTRAVENOUS | Status: AC
  Administered 2018-04-01: 500 mL via INTRAVENOUS

## 2018-04-01 MED ORDER — FENTANYL BOLUS VIA INFUSION
25.0000 ug | INTRAVENOUS | Status: DC | PRN
  Administered 2018-04-01: 25 ug via INTRAVENOUS
  Administered 2018-04-02: 50 ug via INTRAVENOUS
  Administered 2018-04-02: 25 ug via INTRAVENOUS
  Filled 2018-04-01: qty 25

## 2018-04-01 MED ORDER — SODIUM CHLORIDE 0.9 % IV SOLN
1.0000 g | INTRAVENOUS | Status: AC
  Administered 2018-04-01 – 2018-04-07 (×7): 1 g via INTRAVENOUS
  Filled 2018-04-01 (×7): qty 1

## 2018-04-01 NOTE — Progress Notes (Signed)
[place] TEAM 1 - Stepdown/ICU TEAM  [name] [name]  FMB:[id number] DOB: [birth year] DOA: 03/25/2018 PCP: Derinda Late, MD    Brief Narrative:  73yo w/ a hx of asthma, obstructive sleep apnea not on CPAP, chronic hypoxia on 2 L nasal cannula, CAD status post CABG, diastolic CHF, diabetes mellitus type 2, peripheral arterial disease, and essential hypertension who came to the hospital with cough with elevated blood pressure. She was found to be severely hypoxic, requiring intubation initially. Hospital course complicated by severe thrombocytopenia concerning for ITP requiring IV steroids and IVIG. Eventually extubated 2/25. A TTE noted ejection fraction 25-30%  Subjective: More alert today, but somewhat sob. Reports difficulty sleeping. Denies cp, n/v, or abdom pain.   Assessment & Plan:  Acute newly diagnosed systolic CHF TTE noted EF 25-30% - TTE a year ago noted EF 55-60% - CHF Team directing care of this issue - Takotsubo v/s ischemic - will need eventual CAD eval - currently having difficulty balancing renal fxn w/ need for diuresis   Acute kidney failure on Chronic kidney disease stage III baseline creatinine 1.7 - crt climbing w/ attempts to diurese - cont to hold diuretic - check renal US to r/o reversible issue, but doubt - follow trend - if volume status worsens may progress to point of requiring HD for volume control   Recent Labs  Lab 03/29/18 0518 03/29/18 1645 03/30/18 0309 03/31/18 0320 04/01/18 0450  CREATININE 1.79* 1.86* 1.88* 2.81* 3.17*    Acute hypoxic resp failure  Pulmonary edema + PNA - still requiring HFNC but may be able to wean to [place] - cont abx tx   Thrombocytopenia, suspect ITP Platelets cont to improve after 2 doses of IVIG + steroid burst - Hematology following prn at this point   Acute metabolic encephalopathy Delirium versus underlying infection versus steroid-induced - CT head unremarkable - likely multifactorial, w/ uremia now likely  contributing too - follow  Recent Labs  Lab 03/29/18 1645 03/30/18 0309 03/31/18 0320 04/01/18 0450  BUN 47* 55* 76* 93*    Community-acquired pneumonia complete course of empiric abx - appears stable from infectious standpoint   Diabetes mellitus type 2; uncontrolled  w/ Peripheral neuropathy  CBG variable - anticipate poor intake today due to uremia - follow w/o change in regimen   CAD cath 9/19 with severe native CAD and LIMA patent, sequential SVG-PLOM/OM1 with 80% proximal stenosis, s/p DES, SVG to RCA chronically occluded - Cards following - will not be a candidate for cath any time soon w/ worsening renal failure - Plavix resumed   Paroxysmal atrial fibrillation Consider resuming anticoagulation once platelets > 100 - cont amiodarone  HTN BP marginal   Hypokalemia Due to diuretic + poor intake - supplement gently in setting of progressive renal failure   Obesity - Body mass index is 49.72 kg/m.   DVT prophylaxis: SCDs Code Status: FULL CODE Family Communication: spoke w/ husband at bedside   Disposition Plan: SDU  Consultants:  CHF Team  Hematology PCCM  Antimicrobials:  Aztreonam 2/22 Levaquin 2/22 > Vancomycin 2/23 Cefepime 2/23  Objective: Blood pressure 101/66, pulse (!) 56, temperature 98.6 F (37 C), temperature source Axillary, resp. rate 13, height 5' 2.5" (1.588 m), weight 125.3 kg, SpO2 99 %.  Intake/Output Summary (Last 24 hours) at 04/01/2018 0845 Last data filed at 04/01/2018 0411 Gross per 24 hour  Intake 130 ml  Output 1400 ml  Net -1270 ml   Filed Weights   03/30/18 0431 03/31/18 0319 04/01/18  0500  Weight: 126.2 kg 123.8 kg 125.3 kg    Examination: General: mild resp discomfort w/o distress  Lungs: scattered crackles w/ course upper airway sounds th/o  Cardiovascular: Regular rate and rhythm without murmur  Abdomen: Nontender, overweight, soft, bowel sounds positive, no rebound Extremities: 1+ B LE edema   CBC: Recent  Labs  Lab 03/25/18 1630  03/30/18 0309 03/31/18 0320 04/01/18 0450  WBC 9.4   < > 15.2* 11.1* 12.2*  NEUTROABS 8.1*  --   --   --   --   HGB 13.4   < > 12.8 12.5 12.5  HCT 43.9   < > 41.2 41.8 40.1  MCV 101.4*   < > 98.8 99.5 97.1  PLT 104*   < > 74* 81* 93*   < > = values in this interval not displayed.   Basic Metabolic Panel: Recent Labs  Lab 03/27/18 0434  03/28/18 0512  03/29/18 0518  03/30/18 0309 03/31/18 0320 04/01/18 0450  NA 141   < > 141   < > 139   < > 136 137 134*  K 3.3*   < > 3.1*   < > 2.9*   < > 3.2* 3.3* 3.3*  CL 92*  --  88*   < > 80*   < > 81* 81* 82*  CO2 35*  --  42*   < > 45*   < > 42* 45* 42*  GLUCOSE 120*  --  138*   < > 283*   < > 188* 188* 209*  BUN 23  --  30*   < > 38*   < > 55* 76* 93*  CREATININE 1.71*  --  2.11*   < > 1.79*   < > 1.88* 2.81* 3.17*  CALCIUM 8.2*  --  8.3*   < > 8.4*   < > 9.0 9.0 8.7*  MG 1.8  --  2.1  --  2.1  --   --   --  2.3  PHOS 2.9  --  2.1*  --  4.0  --   --   --   --    < > = values in this interval not displayed.   GFR: Estimated Creatinine Clearance: 21.1 mL/min (A) (by C-G formula based on SCr of 3.17 mg/dL (H)).  Liver Function Tests: Recent Labs  Lab 03/25/18 1630  AST 38  ALT 22  ALKPHOS 72  BILITOT 1.5*  PROT 7.3  ALBUMIN 3.9   Recent Labs  Lab 03/25/18 1630  LIPASE 19    Coagulation Profile: Recent Labs  Lab 03/25/18 1630 03/28/18 1046  INR 1.60 1.5*    Cardiac Enzymes: Recent Labs  Lab 03/25/18 1630 03/25/18 2230 03/26/18 0522  TROPONINI 0.05* 0.40* 0.48*    HbA1C: Hgb A1c MFr Bld  Date/Time Value Ref Range Status  10/21/2017 05:12 AM 8.0 (H) 4.8 - 5.6 % Final    Comment:    (NOTE) Pre diabetes:          5.7%-6.4% Diabetes:              >6.4% Glycemic control for   <7.0% adults with diabetes   07/18/2013 08:16 PM 7.2 (H) <5.7 % Final    Comment:    (NOTE)  According to the ADA Clinical Practice  Recommendations for 2011, when HbA1c is used as a screening test:  >=6.5%   Diagnostic of Diabetes Mellitus           (if abnormal result is confirmed) 5.7-6.4%   Increased risk of developing Diabetes Mellitus References:Diagnosis and Classification of Diabetes Mellitus,Diabetes ZOXW,9604,54(UJWJX 1):S62-S69 and Standards of Medical Care in         Diabetes - 2011,Diabetes BJYN,8295,62 (Suppl 1):S11-S61.    CBG: Recent Labs  Lab 03/31/18 0758 03/31/18 1804 03/31/18 1926 04/01/18 0000 04/01/18 0801  GLUCAP 155* 278* 215* 286* 170*    Recent Results (from the past 240 hour(s))  Urine culture     Status: Abnormal   Collection Time: 03/25/18  4:18 PM  Result Value Ref Range Status   Specimen Description URINE, RANDOM  Final   Special Requests   Final    NONE Performed at [facility], [place] [address] [place], [place] [address]    Culture 10,000 COLONIES/mL ESCHERICHIA COLI (A)  Final   Report Status 03/27/2018 FINAL  Final   Organism ID, Bacteria ESCHERICHIA COLI (A)  Final      Susceptibility   Escherichia coli - MIC*    AMPICILLIN <=2 SENSITIVE Sensitive     CEFAZOLIN <=4 SENSITIVE Sensitive     CEFTRIAXONE <=1 SENSITIVE Sensitive     CIPROFLOXACIN <=0.25 SENSITIVE Sensitive     GENTAMICIN <=1 SENSITIVE Sensitive     IMIPENEM <=0.25 SENSITIVE Sensitive     NITROFURANTOIN <=16 SENSITIVE Sensitive     TRIMETH/SULFA <=20 SENSITIVE Sensitive     AMPICILLIN/SULBACTAM <=2 SENSITIVE Sensitive     PIP/TAZO <=4 SENSITIVE Sensitive     Extended ESBL NEGATIVE Sensitive     * 10,000 COLONIES/mL ESCHERICHIA COLI  Culture, blood (routine x 2)     Status: None   Collection Time: 03/25/18  4:18 PM  Result Value Ref Range Status   Specimen Description BLOOD RIGHT ANTECUBITAL  Final   Special Requests   Final    BOTTLES DRAWN AEROBIC AND ANAEROBIC Blood Culture results may not be optimal due to an inadequate volume of blood received in culture bottles   Culture   Final    NO  GROWTH 5 DAYS Performed at [facility], [place] [address] [place], [place] [address]    Report Status 03/30/2018 FINAL  Final  Culture, blood (routine x 2)     Status: None   Collection Time: 03/25/18  4:23 PM  Result Value Ref Range Status   Specimen Description BLOOD LEFT ANTECUBITAL  Final   Special Requests   Final    AEROBIC BOTTLE ONLY Blood Culture results may not be optimal due to an inadequate volume of blood received in culture bottles   Culture   Final    NO GROWTH 5 DAYS Performed at [facility], [place] [address] [place], [place] [address]    Report Status 03/30/2018 FINAL  Final  MRSA PCR Screening     Status: None   Collection Time: 03/26/18 12:00 AM  Result Value Ref Range Status   MRSA by PCR NEGATIVE NEGATIVE Final    Comment:        The GeneXpert MRSA Assay (FDA approved for NASAL specimens only), is one component of a comprehensive MRSA colonization surveillance program. It is not intended to diagnose MRSA infection nor to guide or monitor treatment for MRSA infections. Performed at [facility], [place],  [place] [address]   Culture, respiratory (non-expectorated)     Status: None   Collection Time: 03/27/18  8:46 AM  Result Value Ref Range Status   Specimen Description TRACHEAL ASPIRATE  Final   Special Requests NONE  Final   Gram Stain   Final    ABUNDANT WBC PRESENT, PREDOMINANTLY PMN NO ORGANISMS SEEN    Culture   Final    FEW Consistent with normal respiratory flora. Performed at [facility], [place] [address] [place], [place] [address]    Report Status 03/29/2018 FINAL  Final     Scheduled Meds: . amiodarone  100 mg Oral Daily  . arformoterol  15 mcg Nebulization BID  . budesonide (PULMICORT) nebulizer solution  0.25 mg Nebulization BID  . buPROPion  75 mg Oral BID  . chlorhexidine gluconate (MEDLINE KIT)  15 mL Mouth Rinse BID  . Chlorhexidine Gluconate Cloth  6 each Topical Daily  . cholecalciferol   2,000 Units Oral Q breakfast  . clopidogrel  75 mg Oral Daily  . ezetimibe  10 mg Oral QHS  . feeding supplement (GLUCERNA SHAKE)  237 mL Oral BID BM  . gabapentin  100 mg Oral BID  . gabapentin  400 mg Oral QHS  . insulin aspart  0-15 Units Subcutaneous TID WC  . insulin aspart  0-5 Units Subcutaneous QHS  . insulin glargine  25 Units Subcutaneous Q2200  . metoprolol succinate  50 mg Oral BID  . multivitamin  1 tablet Oral Daily  . pantoprazole  40 mg Oral BID  . rosuvastatin  40 mg Oral QHS  . sodium chloride flush  10-40 mL Intracatheter Q12H  . vitamin B-12  1,000 mcg Oral Daily   Continuous Infusions: . sodium chloride Stopped (03/29/18 0629)  . levofloxacin (LEVAQUIN) IV 500 mg (03/31/18 1402)     LOS: 7 days   Cherene Altes, MD [facility] Office  [phone] Pager - Text Page per Amion  If 7PM-7AM, please contact night-coverage per Amion 04/01/2018, 8:45 AM

## 2018-04-01 NOTE — Progress Notes (Signed)
NAME:  [name] [name], [name]:  [id number], DOB:  1945-05-01, LOS: 7 ADMISSION DATE:  03/25/2018, CONSULTATION DATE:  03/25/2018 REFERRING MD:  ED physician, CHIEF COMPLAINT:  Acute hypoxic respiratory failure   Brief History   73 year old woman with history of asthma, OSA not on CPAP, chronic hypoxic respiratory failure on 2L at night, CAD s/p CABG and multiple PCI, HFpEF s/p CardioMEMS, AF on Eliquis, cerebrovascular disease, HTN, CKD, DM2, PAD presenting with cough. History obtained from daughter, husband, and EMR due to intubation and sedation. She has been feeling poorly for the past week with cough productive of sputum. She has been having intermittent emesis and post tussive emesis. She has not been able to take her medications because of her emesis. She had a fall yesterday because of weakness. She was noted to be hypoxic to the 70s this afternoon when her daughter checked.   She lives at home with her husband. She is only able to walk a few feet before she gets dyspneic. She just recently got a rollator to help with mobility. Her husband is her HCPOA.  In the ED, pt febrile to 102.6 with SBP 170-209, HR 70s, O2 in the mid 80s. ECG showed sinus rhythm with LBBB-like IVCD. VBG 7.38/50. Labs included Cr 1.34 (at/improved from baseline), BNP 1480 (last 701 in 01/2018), troponin 0.05. Lactate 1.8. WBC 9.4. Flu panel negative. CXR showed cardiomegaly and bilateral infiltrates. She was intubated in the ED and started on IV aztreonam. IV furosemide was given, and NTG infusion was started for BP control.   Past Medical History  asthma, OSA not on CPAP, chronic hypoxic respiratory failure on 2L at night, CAD s/p CABG and multiple PCI, HFpEF s/p CardioMEMS, AF on Eliquis, cerebrovascular disease, HTN, CKD, DM2, PAD  Significant Hospital Events   2/22 > admit, intubated. Urine with e colii  2/23- echo EF 25-30% with global HK worse from mid-ventricle to apex. RV moderately down. (Echo 1/19 EF  55-60%)  2/25- NAEO. Improved nausea with phenergan. Transferrred and ccm signed off. Plat < 5k). HITT - negtive  2/26 - CT head - nil acute (for ITP - > Received 1 unit of platelets on 03/28/2018 and IVIG 1 mg/kg X 2 doses 03/28/2018 and 03/29/2018  2.27 - platelet upat 74K . Concerne is ITP (not TTP). Ongoing delirum but improved. Cards recommendnig to limit sterods. Cardd ddx - acute IHD s-CHF v Takasubo. Maintained on amio for PAF. Rx with levaquin for CAP NPOS  2/28 - per hospitalist, "The patient is awake but confused this morning.  She denies chest pain shortness breath fevers or chills.  She appears to be resting comfortably.  There is no evidence of acute distress or uncontrolled pain." Platelet improved to 81K - back on Plavix and Eliquis once platelet > 100K. However, . AKI worse  > torsemide and aldactone held and some IVF give  Consults:  Cardiology 2/22 Hematology  Procedures:  Intubated 2/22>>>   Significant Diagnostic Tests:  CXR 2/22 > Cardiomegaly. Worsening bilateral airspace disease CXR 2/24> pulmonary edema pattern CXR 2/25> worsening RUL opacity   Micro Data:  BCx x 2 2/22 >> no growth 24 hours MRSA pcr 2/23 - neg  Resp cx 2/22 >>  UCx 2/22 >> e coli  Influenza PCR 2/22 >> neg  Antimicrobials:  Aztreonam 2/22 Levaquin 2/22 >> Vancomycin 2/23>>  Cefepime 2/23>>  Interim history/subjective:    2/29 -  Afebrile but wbc 12.2. CCM called back urgently for resp distress. Per RN  was apneic and pale approx 2h agao and responded alertness to BiPAP but uanble to maintain pulse ox beyond 79%, Tachypnenic and paradoxical. Maintains BP. Extremities cool.  -. > moved to 2H and intubated. PEr DR Benismohn morning Coox was 74%. Has worsening renal failure to creat 3.17mg %. Renal US -> no acutte findings. Plat improved to 93K  Per RN at [facility] -> family feel stopping lasix and starting fluids might have played a role in worsening dyspnea   Objective   Blood pressure (!)  116/52, pulse (!) 55, temperature 98.3 F (36.8 C), temperature source Axillary, resp. rate 15, height 5' 2.5" (1.588 m), weight 125.3 kg, SpO2 94 %. CVP:  [13 mmHg-17 mmHg] 13 mmHg      Intake/Output Summary (Last 24 hours) at 04/01/2018 1400 Last data filed at 04/01/2018 0411 Gross per 24 hour  Intake 130 ml  Output 1400 ml  Net -1270 ml   Filed Weights   03/30/18 0431 03/31/18 0319 04/01/18 0500  Weight: 126.2 kg 123.8 kg 125.3 kg   General Appearance:  Looks criticall ill OBESE - + Head:  Normocephalic, without obvious abnormality, atraumatic Eyes:  PERRL - yes, conjunctiva/corneas - muddy     Ears:  Normal external ear canals, both ears Nose:  G tube - no Throat:  ETT TUBE - no , OG tube - no. Has BIPAP  Neck:  Supple,  No enlargement/tenderness/nodules Lungs: Clear to auscultation bilaterally, PARADOXICAL, TACHYPNEIC Heart:  S1 and S2 normal, no murmur, CVP - no.  Pressors - no Abdomen:  Soft, no masses, no organomegaly Genitalia / Rectal:  Not done Extremities:  Extremities- COOLD, DUSKY echhmyotic edematous areas distally Skin:  ntact in exposed areas . Sacral area - not examined Neurologic:  Sedation - none -> RASS - +1 . Moves all 4s - yes. CAM-ICU - not tested . Orientation - not tested but followed ommands      LABS    PULMONARY Recent Labs  Lab 03/25/18 1645 03/25/18 2148  03/27/18 0637 03/28/18 0455 03/28/18 0530 03/29/18 0510 03/30/18 0440 03/31/18 0335 04/01/18 0531  PHART  --  7.335*  --  7.471* 7.524*  --   --   --   --   --   PCO2ART  --  53.6*  --  58.4* 58.0*  --   --   --   --   --   PO2ART  --  103.0  --  64.0* 66.0*  --   --   --   --   --   HCO3 30.0* 28.6*  --  42.7* 47.9*  --   --   --   --   --   TCO2 32 30  --  44* 50*  --   --   --   --   --   O2SAT 76.0 97.0   < > 93.0 94.0 83.8 81.8 75.4 75.7 70.0   < > = values in this interval not displayed.    CBC Recent Labs  Lab 03/30/18 0309 03/31/18 0320 04/01/18 0450  HGB 12.8 12.5  12.5  HCT 41.2 41.8 40.1  WBC 15.2* 11.1* 12.2*  PLT 74* 81* 93*    COAGULATION Recent Labs  Lab 03/25/18 1630 03/28/18 1046  INR 1.60 1.5*    CARDIAC   Recent Labs  Lab 03/25/18 1630 03/25/18 2230 03/26/18 0522  TROPONINI 0.05* 0.40* 0.48*   No results for input(s): PROBNP in the last 168 hours.   CHEMISTRY Recent Labs  Lab  03/25/18 1630  03/26/18 0522 03/27/18 0434  03/28/18 0512  03/29/18 0518 03/29/18 1645 03/30/18 0309 03/31/18 0320 04/01/18 0450  NA 138   < > 141 141   < > 141   < > 139 136 136 137 134*  K 4.1   < > 4.9 3.3*   < > 3.1*   < > 2.9* 3.5 3.2* 3.3* 3.3*  CL 101  --  101 92*  --  88*   < > 80* 81* 81* 81* 82*  CO2 26  --  22 35*  --  42*   < > 45* 42* 42* 45* 42*  GLUCOSE 166*  --  135* 120*  --  138*   < > 283* 339* 188* 188* 209*  BUN 15  --  22 23  --  30*   < > 38* 47* 55* 76* 93*  CREATININE 1.34*  --  1.68* 1.71*  --  2.11*   < > 1.79* 1.86* 1.88* 2.81* 3.17*  CALCIUM 9.1  --  8.5* 8.2*  --  8.3*   < > 8.4* 9.0 9.0 9.0 8.7*  MG 2.0  --  2.1 1.8  --  2.1  --  2.1  --   --   --  2.3  PHOS 2.8  --  3.6 2.9  --  2.1*  --  4.0  --   --   --   --    < > = values in this interval not displayed.   Estimated Creatinine Clearance: 21.1 mL/min (A) (by C-G formula based on SCr of 3.17 mg/dL (H)).   LIVER Recent Labs  Lab 03/25/18 1630 03/28/18 1046  AST 38  --   ALT 22  --   ALKPHOS 72  --   BILITOT 1.5*  --   PROT 7.3  --   ALBUMIN 3.9  --   INR 1.60 1.5*     INFECTIOUS Recent Labs  Lab 03/25/18 1630 03/25/18 2126 03/25/18 2230 03/26/18 0003 03/27/18 0434 03/28/18 0512  LATICACIDVEN 1.8 2.2*  --  1.7  --   --   PROCALCITON  --   --  0.32  --  3.08 2.39     ENDOCRINE CBG (last 3)  Recent Labs    04/01/18 0000 04/01/18 0801 04/01/18 1236  GLUCAP 286* 170* 201*         IMAGING x48h  - image(s) personally visualized  -   highlighted in bold US Renal  Result Date: 04/01/2018 CLINICAL DATA:  Acute kidney  failure. EXAM: RENAL / URINARY TRACT ULTRASOUND COMPLETE COMPARISON:  None. FINDINGS: Right Kidney: Renal measurements: 10.6 x 4.9 x 6.1 cm = volume: 165 mL . Echogenicity within normal limits. No mass or hydronephrosis visualized. Left Kidney: Renal measurements: 8.6 x 4.5 x 4.0 cm = volume: 79 mL. Normal parenchymal echogenicity. Diffuse cortical thinning. No mass, stone or hydronephrosis. Bladder: Appears normal for degree of bladder distention. IMPRESSION: 1. No acute finding.  No hydronephrosis. 2. Left renal atrophy/cortical thinning with a reduced renal size. No other abnormalities. Electronically Signed   By: Lajean Manes M.D.   On: 04/01/2018 12:25     Assessment & Plan:  73 year old woman with history of asthma, OSA not on CPAP, chronic hypoxic respiratory failure on 2L at night, CAD s/p CABG and multiple PCI, HFpEF s/p CardioMEMS, AF on Eliquis, cerebrovascular disease, HTN, CKD, DM2, PAD presenting with cough found in the ED to have fever, hypertensive urgency, and acute on chronic hypoxic  respiratory failure.      ASSESSMENT / PLAN:  PULMONARY A:  Acute respiratory failure - intubated 03/25/2018 through 03/27/2018. Reintubated 04/01/2018 (no rever)  04/01/2018 -> suspect s-CHF v ? HCAP v delirium and fatigute for reintubation  P:   Full vent supporrt VAP bundle  CXR NEbs    CARDIAC A: #baseline  - CAD s/p CABG and multiple PC - home plavix, asa, zetia, statin, ranexa - A Fib on amio and eliquis at home  #This admit - new acute s-CHF 03/26/2018. Coox normal 04/01/2018 (diureses held 1d earlier)  #Currently  - QTc 571msec and prolonged  P: Check stat cvp and coox post intubation CHF/cards manging Continue plavix, anti-lipid meds,  Continue amio (needs qtc monitring) Restart eliquis when plat > 100K   VASCULAR A:   Post intubation circulatory shock - resolving  P:  Levophed for MAP > 65 Might need to diurese if comes off pressors  RENAL A:  AKI on CKD -  getting worse. Renal US 04/01/2018 without hydro. Cardio renal syndrom  P:  Foley Per renal and cards  ELECTROLYTES A:  At risk for electrolyute imbalance P: Monitor and replete   GASTROINTESTINAL A:   No hx of aspration but on nausea meds  P:   PPI OIG tube DC reglan (QTc risk)  HEMATOLOGIC A:  Suspected ITP  (for ITP - > Received 1 unit of platelets on 03/28/2018 and IVIG 1 mg/kg X 2 doses 03/28/2018 and 03/29/2018)  - 2/29 - platelet improving    P:  - PRBC for hgb </= 6.9gm%    - exceptions are   -  if ACS susepcted/confirmed then transfuse for hgb </= 8.0gm%,  or    -  active bleeding with hemodynamic instability, then transfuse regardless of hemoglobin value   At at all times try to transfuse 1 unit prbc as possible with exception of active hemorrhage  - eliquis when plat > 500k  INFECTIOUS A:   Admitted for presumed CAP. RVP negative./ MRSA pcrt negative. Urine with ECOLII +. So, dx of UTI  P:   Dc levaquin (QTc , tendon and delirium risk) Restart cefepime Pan culture rule out HCAP Recheck sepsis biomarker   ENDOCRINE A:   DM   P:   ICU phase 1 hyperglycemia protocl  NEUROLOGIC A:   Home meds: wellbutrin, cymbalta, gabapentin, xanax, vicodin, robaxin, xanax - SHE IS AT HIGH RISK FOR DELIRIUm   - Delirium on and off since admit - per records slowly improving as of 04/01/2018  P:   fent gtt RASS goal o to -2 Dc gabapentin and other home meds   GLOBAL Concerned for cardio-renal syndrome and deterioration along with sepsis./HCAP. And CNS polypharmacy   Best practice:  Diet: NPO Pain/Anxiety/Delirium protocol (if indicated): fent gtt/PRN, delirium precautions VAP protocol (if indicated): ordered DVT prophylaxis: SCDs GI prophylaxis: PPI Glucose control: SSI Mobility: as tolerated  Code Status: FULL Family Communication: daughter and husband Disposition: ICU      ATTESTATION & SIGNATURE   The patient [name] [name] is critically  ill with multiple organ systems failure and requires high complexity decision making for assessment and support, frequent evaluation and titration of therapies, application of advanced monitoring technologies and extensive interpretation of multiple databases.   Critical Care Time devoted to patient care services described in this note is  60  Minutes. This time reflects time of care of this signee Dr Brand Males. This critical care time does not reflect procedure time, or  teaching time or supervisory time of PA/NP/Med student/Med Resident etc but could involve care discussion time     Dr. Brand Males, M.D., [facility].C.P Pulmonary and Critical Care Medicine Staff Physician [facility] Pager: [phone], If no answer or between  15:00h - 7:00h: call [phone]  04/01/2018 2:01 PM

## 2018-04-01 NOTE — Progress Notes (Signed)
Pharmacy Antibiotic Note  [name] [name] is a 73 y.o. female admitted on 03/25/2018 with chronic hypoxic respiratory failure, possible infection.  Pharmacy has been consulted for vancomycin/cefepime dosing. Tmax 102.6, WBC 11.4, PCT 0.32, LA 1.8>2.2>1.7. She received aztreonam x1 in the ED and was started on levofloxacin. Started on vancomycin/cefepime, then narrowed to levaquin. Patient with reported penicillin allergy, she received keflex in 2013.   Respiratory status worsening today, requiring intubation.  Pharmacy asked to restart cefepime for suspected HCAP.  Plan: Cefepime 1g IV q24h for now Follow cultures, clinical pictures, and renal function   Height: 5\' 2"  (157.5 cm) Weight: 276 lb 3.8 oz (125.3 kg) IBW/kg (Calculated) : 50.1  Temp (24hrs), Avg:98.1 F (36.7 C), Min:97.7 F (36.5 C), Max:98.6 F (37 C)  Recent Labs  Lab 03/25/18 1630 03/25/18 2126 03/26/18 0003  03/28/18 1500  03/29/18 0518 03/29/18 1645 03/30/18 0309 03/31/18 0320 04/01/18 0450  WBC 9.4  --   --    < > 16.0*  --  13.9*  --  15.2* 11.1* 12.2*  CREATININE 1.34*  --   --    < >  --    < > 1.79* 1.86* 1.88* 2.81* 3.17*  LATICACIDVEN 1.8 2.2* 1.7  --   --   --   --   --   --   --   --    < > = values in this interval not displayed.    Estimated Creatinine Clearance: 20.9 mL/min (A) (by C-G formula based on SCr of 3.17 mg/dL (H)).    Allergies  Allergen Reactions  . Amoxicillin Shortness Of Breath and Rash  . Brilinta [Ticagrelor] Shortness Of Breath  . Erythromycin Shortness Of Breath, Other (See Comments) and Hives    Trouble swallowing  . Flagyl [Metronidazole] Palpitations and Shortness Of Breath  . Penicillins Hives, Shortness Of Breath, Rash and Other (See Comments)    Has patient had a PCN reaction causing immediate rash, facial/tongue/throat swelling, SOB or lightheadedness with hypotension: Yes Has patient had a PCN reaction causing severe rash involving mucus membranes or skin necrosis:  No Has patient had a PCN reaction that required hospitalization: Yes Has patient had a PCN reaction occurring within the last 10 years: No If all of the above answers are "NO", then may proceed with Cephalosporin use.   . Isosorbide Mononitrate [Isosorbide Nitrate] Other (See Comments)    Joint aches, muscles hurt, difficult to walk   . Jardiance [Empagliflozin] Other (See Comments)    Nausea, joint aches, muscles aches  . Metformin And Related Other (See Comments)    Stomach pain, cold sweats, joint pain, burred vision, dizziness  . Tape Other (See Comments)    Skin pulls off with certain types Plastic tape causes skin to rip if left on for long periods of time  . Heparin     Low risk HIT; Heparin Antibody = PENDING  . Erythromycin Base Rash    Antimicrobials this admission: Vanc 2/23 >>2/24 Cefepime 2/23 >>2/24; resume 2/29 >  Levaquin 2/23 >> 2/29 Aztreonam x1 2/23  Dose adjustments this admission:  Microbiology results: 2/22 Bcx: NG < 24 hrs  2/23 MRSA negative  2/22 TA:  2/22 Ucx: <10K E Coli  Marguerite Olea, [facility] Clinical Pharmacist Phone [phone]  04/01/2018 3:18 PM

## 2018-04-01 NOTE — Progress Notes (Signed)
CRITICAL VALUE ALERT  Critical Value:  Troponin 0.10  Date & Time Notied:  04/01/18  Provider Notified: Dr. Chase Caller  Orders Received/Actions taken: Continue to monitor.

## 2018-04-01 NOTE — Progress Notes (Signed)
At approximately 1220  Pt was noted by staff member to have 12 seconds of apnea. Pt is having periods of tachypnea, and labored breathing. Pt can barely speak because of her respiratory status. md McClung called and at bedside. Pt started on bipap, continue to monitor. pts husband aware. [name] [name]

## 2018-04-01 NOTE — Progress Notes (Signed)
1515: Patient transferred into room 2H21 from 2 [place] d/t respiratory distress, requiring intubation. Received bedside report from [facility], who reported that the patient became diaphoretic and SOB. Bipap applied but ineffective. Family in lobby. Patient was intubated after receiving Fentanyl, Versed, and medications from RSI kit. OG tube was placed. Initially patient became hypotensive and Levophed was started, but BP quickly returned to normal. Blood cultures, urine cultures, sputum culture, were sent. EKG performed d/t the diaphoresis. See results review. Co-ox obtained 78%, CVP 10. Dr.Bensimhon was notified of her transfer to ICU and spoke with Dr. Chase Caller regarding plan. F/C inserted. A-line placed. Will continue to monitor closely. Husband and daughter at bedside, husband tearful.

## 2018-04-01 NOTE — Procedures (Signed)
Intubation Procedure Note [name] [name] [id number] January 15, 1946  Procedure: Intubation Indications: Respiratory insufficiency  Procedure Details Consent: Risks of procedure as well as the alternatives and risks of each were explained to the (patient/caregiver).  Consent for procedure obtained. Time Out: Verified patient identification, verified procedure, site/side was marked, verified correct patient position, special equipment/implants available, medications/allergies/relevent history reviewed, required imaging and test results available.  Performed  Maximum sterile technique was used including cap, gloves, gown and hand hygiene.  MAC    Evaluation Hemodynamic Status: BP stable throughout; O2 sats: low before procedure but improved with bagging and during intubation Patient's Current Condition: stable Complications: No apparent complications Patient did tolerate procedure well. Chest X-ray ordered to verify placement.  CXR: pending.   Brand Males 04/01/2018

## 2018-04-01 NOTE — Progress Notes (Signed)
PT Cancellation Note  Patient Details Name: [name] [name] [name]: [id number] DOB: 13-Apr-1945   Cancelled Treatment:    Reason Eval/Treat Not Completed: (P) Medical issues which prohibited therapy(On arrival patient with hypoxia and respiratory distress, current rapid response taking place with provider present. Will defer PT at this time as it is not appropriate.  )   [name] [name] 04/01/2018, 12:10 PM  Governor Rooks, PTA Acute Rehabilitation Services Pager [phone] Office [phone]

## 2018-04-01 NOTE — Progress Notes (Signed)
ET tube pulled back, per order, from 24 cm to 23 cm.

## 2018-04-01 NOTE — Progress Notes (Signed)
RT Note: Patient had been on Bipap for almost 2 hours. Dr. Chase Caller came to the unit to see her and notified RT that he wants her transported to the ICU for intubation. Upon entering the patients room she was saturating 76% on the Bipap. Her O2 was increased to 100% and her O2 saturation increased to 94%. Patient is being transported to the ICU presently on the [place]. Rt will continue to monitor and assist as needed.

## 2018-04-01 NOTE — Plan of Care (Signed)
Na

## 2018-04-01 NOTE — Procedures (Signed)
Arterial Catheter Insertion Procedure Note [name] [name] [id number] 01-27-1946  Procedure: Insertion of Arterial Catheter  Indications: Blood pressure monitoring and Frequent blood sampling  Procedure Details Consent: Unable to obtain consent because of altered level of consciousness. Time Out: Verified patient identification, verified procedure, site/side was marked, verified correct patient position, special equipment/implants available, medications/allergies/relevent history reviewed, required imaging and test results available.  Performed  Maximum sterile technique was used including antiseptics, cap, gloves, gown, hand hygiene, mask and sheet. Skin prep: Chlorhexidine; local anesthetic administered 20 gauge catheter was inserted into left radial artery using the Seldinger technique. ULTRASOUND GUIDANCE USED: NO Evaluation Blood flow good; BP tracing good. Complications: No apparent complications.   Donnetta Hail 04/01/2018

## 2018-04-02 ENCOUNTER — Inpatient Hospital Stay (HOSPITAL_COMMUNITY): Payer: Medicare Other

## 2018-04-02 LAB — GLUCOSE, CAPILLARY
Glucose-Capillary: 120 mg/dL — ABNORMAL HIGH (ref 70–99)
Glucose-Capillary: 178 mg/dL — ABNORMAL HIGH (ref 70–99)
Glucose-Capillary: 158 mg/dL — ABNORMAL HIGH (ref 70–99)
Glucose-Capillary: 157 mg/dL — ABNORMAL HIGH (ref 70–99)
Glucose-Capillary: 160 mg/dL — ABNORMAL HIGH (ref 70–99)

## 2018-04-02 LAB — CBC
HCT: 38.7 % (ref 36.0–46.0)
Hemoglobin: 12.6 g/dL (ref 12.0–15.0)
MCH: 30.9 pg (ref 26.0–34.0)
MCHC: 32.6 g/dL (ref 30.0–36.0)
MCV: 94.9 fL (ref 80.0–100.0)
Platelets: 136 10*3/uL — ABNORMAL LOW (ref 150–400)
RBC: 4.08 MIL/uL (ref 3.87–5.11)
RDW: 13.1 % (ref 11.5–15.5)
WBC: 18.1 10*3/uL — ABNORMAL HIGH (ref 4.0–10.5)
nRBC: 0 % (ref 0.0–0.2)

## 2018-04-02 LAB — TROPONIN I: Troponin I: 0.13 ng/mL (ref <0.03)

## 2018-04-02 LAB — BASIC METABOLIC PANEL
Anion gap: 11 (ref 5–15)
BUN: 87 mg/dL — ABNORMAL HIGH (ref 8–23)
CO2: 37 mmol/L — ABNORMAL HIGH (ref 22–32)
Calcium: 8.8 mg/dL — ABNORMAL LOW (ref 8.9–10.3)
Chloride: 91 mmol/L — ABNORMAL LOW (ref 98–111)
Creatinine, Ser: 2.53 mg/dL — ABNORMAL HIGH (ref 0.44–1.00)
GFR calc Af Amer: 22 mL/min — ABNORMAL LOW (ref >60)
GFR calc non Af Amer: 19 mL/min — ABNORMAL LOW (ref >60)
Glucose, Bld: 115 mg/dL — ABNORMAL HIGH (ref 70–99)
Potassium: 3.1 mmol/L — ABNORMAL LOW (ref 3.5–5.1)
Sodium: 139 mmol/L (ref 135–145)

## 2018-04-02 LAB — MAGNESIUM: Magnesium: 2.3 mg/dL (ref 1.7–2.4)

## 2018-04-02 LAB — COOXEMETRY PANEL
Carboxyhemoglobin: 1.6 % — ABNORMAL HIGH (ref 0.5–1.5)
Carboxyhemoglobin: 1.3 % (ref 0.5–1.5)
Methemoglobin: 1.2 % (ref 0.0–1.5)
Methemoglobin: 1.7 % — ABNORMAL HIGH (ref 0.0–1.5)
O2 Saturation: 86.5 %
O2 Saturation: 88.2 %
Total hemoglobin: 11.9 g/dL — ABNORMAL LOW (ref 12.0–16.0)
Total hemoglobin: 13.4 g/dL (ref 12.0–16.0)

## 2018-04-02 LAB — URINE CULTURE: Culture: NO GROWTH

## 2018-04-02 LAB — SEDIMENTATION RATE: Sed Rate: 113 mm/hr — ABNORMAL HIGH (ref 0–22)

## 2018-04-02 LAB — PHOSPHORUS: Phosphorus: 2.1 mg/dL — ABNORMAL LOW (ref 2.5–4.6)

## 2018-04-02 LAB — PROCALCITONIN: Procalcitonin: 0.26 ng/mL

## 2018-04-02 MED ORDER — POTASSIUM CHLORIDE 20 MEQ/15ML (10%) PO SOLN
40.0000 meq | Freq: Once | ORAL | Status: AC
  Administered 2018-04-02: 40 meq
  Filled 2018-04-02: qty 30

## 2018-04-02 MED ORDER — LACTATED RINGERS IV BOLUS
500.0000 mL | Freq: Once | INTRAVENOUS | Status: AC
  Administered 2018-04-02: 500 mL via INTRAVENOUS

## 2018-04-02 MED ORDER — VASOPRESSIN 20 UNIT/ML IV SOLN
0.0300 [IU]/min | INTRAVENOUS | Status: DC
  Administered 2018-04-02: 0.03 [IU]/min via INTRAVENOUS
  Filled 2018-04-02: qty 2

## 2018-04-02 MED ORDER — POTASSIUM PHOSPHATES 15 MMOLE/5ML IV SOLN
10.0000 mmol | Freq: Once | INTRAVENOUS | Status: AC
  Administered 2018-04-02: 10 mmol via INTRAVENOUS
  Filled 2018-04-02: qty 3.33

## 2018-04-02 MED ORDER — MIDAZOLAM HCL 2 MG/2ML IJ SOLN
2.0000 mg | Freq: Once | INTRAMUSCULAR | Status: AC
  Administered 2018-04-02: 2 mg via INTRAVENOUS

## 2018-04-02 NOTE — Progress Notes (Signed)
Pt continues to have the twitching through out the night,  She was alert and oriented at times, her b/p was very liable, pressors titrated through out the night to maintain SBP 90, pt HR continue to be in the 50's and at some times 47.  K 3.1, replace 40k. Urine output start to decrease at 2am and continue on.

## 2018-04-02 NOTE — Progress Notes (Signed)
Contacted elink, to notify of the twitching movement that patient is having, Elink nurse and MD looked in on patient, will continue to monitor patient and labs.

## 2018-04-02 NOTE — CV Procedure (Signed)
PA catheter placement and RHC  Indication: Shock  The risks and indication of the procedure were explained to her family. Consent was signed and placed on the chart. An appropriate timeout was taken prior to the procedure. The right neck was prepped and draped in the routine sterile fashion and anesthetized with 1% local lidocaine.  Using u/s guidance we attempted to access the RIJ. Given venous pressure we were unable to cannulate the vein with the standard [facility] needed. The patient was placed in Trendelenburg and using a micro-puncture kit, we were able to access the RIJ successfully. The micropuncture sheath was switched out for a standard 8FR venous sheath. A standard Swan-Ganz catheter was used for the procedure and directed into the the pulmonary artery using pressure waveform guidance. Pressures were sampled and cardiac output measurements performed (see separate note)./  No immediate complications.   CXR pending.   Glori Bickers, MD  4:47 PM

## 2018-04-02 NOTE — Progress Notes (Signed)
Wasted Fentanyl IV bag in med room with Marcene Brawn RN, because while hanging, a hole was punched in the bag and it started leaking on floor, pulled another bag of fentanyl to hang.

## 2018-04-02 NOTE — Progress Notes (Signed)
   [place] cath numbers   RA = 3 PA = 42/18 (26) PCW = 10 Fick cardiac output/index = 7.4/3.4 PVR = 2.1 WU SVR = 807  Appears to be high output septic/vasodilatory shock. Will switch NE to vasopressin. Continue w/u for underlying cause.   Glori Bickers, MD  5:18 PM

## 2018-04-02 NOTE — Progress Notes (Addendum)
Patient ID: [name] [name], female   DOB: [birth year], 73 y.o.   [name]: [id number]    Advanced Heart Failure Rounding Note   Subjective:    Moved back to ICU yesterday with recurrent respiratory failure and severe hypoxia after receiving IVF due to worsening renal function.  Intubated. Initial CVP 10 and co-ox 76%  Remains on ventilator.  NE has been titrated up from 12 to 30. Creatinine now improving 3.2 -> 2.9 -> 2.5. Trop 0.13. Co-ox 86% PCT 0.26   WBC 18.1 on cefipime. CXR with pulmonary edema. But CVP on 3-4. Getting IVF.    Echo EF 25-30% with global HK worse from mid-ventricle to apex. RV moderately down. (Echo 1/19 EF 55-60%)  Objective:   Weight Range:  Vital Signs:   Temp:  [97.7 F (36.5 C)-98.8 F (37.1 C)] 98.8 F (37.1 C) (03/01 1209) Pulse Rate:  [47-77] 58 (03/01 1230) Resp:  [11-29] 14 (03/01 1230) BP: (49-175)/(26-102) 175/63 (03/01 1200) SpO2:  [86 %-100 %] 99 % (03/01 1230) Arterial Line BP: (103-185)/(35-84) 135/46 (03/01 1230) FiO2 (%):  [40 %-100 %] 40 % (03/01 1129) Weight:  [126.6 kg] 126.6 kg (03/01 0500) Last BM Date: 03/25/18  Weight change: Filed Weights   03/31/18 0319 04/01/18 0500 04/02/18 0500  Weight: 123.8 kg 125.3 kg 126.6 kg    Intake/Output:   Intake/Output Summary (Last 24 hours) at 04/02/2018 1340 Last data filed at 04/02/2018 1209 Gross per 24 hour  Intake 1915.77 ml  Output 2320 ml  Net -404.23 ml     Physical Exam: General:  On vent sedated but will awaked  HEENT: normal + ETT Neck: supple. no JVD. Carotids 2+ bilat; no bruits. No lymphadenopathy or thryomegaly appreciated. Cor: PMI nondisplaced. Regular rate & rhythm. No rubs, gallops or murmurs. Lungs: clear Abdomen: obese soft, nontender, nondistended. No hepatosplenomegaly. No bruits or masses. Good bowel sounds. Extremities: no cyanosis, clubbing, rash, edema Neuro: alert & orientedx3, cranial nerves grossly intact. moves all 4 extremities w/o difficulty. Affect  pleasant  Telemetry: Sinus 60s. Personally reviewed.     Labs: Basic Metabolic Panel: Recent Labs  Lab 03/27/18 0434  03/28/18 0092  03/29/18 0518  03/31/18 0320 04/01/18 0450 04/01/18 1539 04/01/18 1648 04/01/18 1810 04/02/18 0324  NA 141   < > 141   < > 139   < > 137 134* 136 133* 135 139  K 3.3*   < > 3.1*   < > 2.9*   < > 3.3* 3.3* 3.5 5.2* 3.5 3.1*  CL 92*  --  88*   < > 80*   < > 81* 82* 83*  --  85* 91*  CO2 35*  --  42*   < > 45*   < > 45* 42* 38*  --  41* 37*  GLUCOSE 120*  --  138*   < > 283*   < > 188* 209* 275*  --  280* 115*  BUN 23  --  30*   < > 38*   < > 76* 93* 90*  --  92* 87*  CREATININE 1.71*  --  2.11*   < > 1.79*   < > 2.81* 3.17* 2.94*  --  2.88* 2.53*  CALCIUM 8.2*  --  8.3*   < > 8.4*   < > 9.0 8.7* 8.9  --  8.9 8.8*  MG 1.8  --  2.1  --  2.1  --   --  2.3 2.3  --   --  2.3  PHOS 2.9  --  2.1*  --  4.0  --   --   --  5.2*  --   --  2.1*   < > = values in this interval not displayed.    Liver Function Tests: No results for input(s): AST, ALT, ALKPHOS, BILITOT, PROT, ALBUMIN in the last 168 hours. No results for input(s): LIPASE, AMYLASE in the last 168 hours. No results for input(s): AMMONIA in the last 168 hours.  CBC: Recent Labs  Lab 03/30/18 0309 03/31/18 0320 04/01/18 0450 04/01/18 1539 04/01/18 1648 04/02/18 0324  WBC 15.2* 11.1* 12.2* 14.9*  --  18.1*  NEUTROABS  --   --   --  12.3*  --   --   HGB 12.8 12.5 12.5 13.5 13.6 12.6  HCT 41.2 41.8 40.1 44.1 40.0 38.7  MCV 98.8 99.5 97.1 96.3  --  94.9  PLT 74* 81* 93* 117*  --  136*    Cardiac Enzymes: Recent Labs  Lab 04/01/18 1539 04/02/18 0324  TROPONINI 0.10* 0.13*    BNP: BNP (last 3 results) Recent Labs    01/16/18 2150 03/25/18 1630  BNP 701.3* 1,480.1*    ProBNP (last 3 results) No results for input(s): PROBNP in the last 8760 hours.    Other results:  Imaging: US Renal  Result Date: 04/01/2018 CLINICAL DATA:  Acute kidney failure. EXAM: RENAL / URINARY  TRACT ULTRASOUND COMPLETE COMPARISON:  None. FINDINGS: Right Kidney: Renal measurements: 10.6 x 4.9 x 6.1 cm = volume: 165 mL . Echogenicity within normal limits. No mass or hydronephrosis visualized. Left Kidney: Renal measurements: 8.6 x 4.5 x 4.0 cm = volume: 79 mL. Normal parenchymal echogenicity. Diffuse cortical thinning. No mass, stone or hydronephrosis. Bladder: Appears normal for degree of bladder distention. IMPRESSION: 1. No acute finding.  No hydronephrosis. 2. Left renal atrophy/cortical thinning with a reduced renal size. No other abnormalities. Electronically Signed   By: Lajean Manes M.D.   On: 04/01/2018 12:25   Dg Chest Port 1 View  Result Date: 04/02/2018 CLINICAL DATA:  Respiratory failure EXAM: PORTABLE CHEST 1 VIEW COMPARISON:  04/01/2018 FINDINGS: Cardiac shadow remains enlarged. Postsurgical changes are again seen. Endotracheal tube, gastric catheter and right-sided PICC line are again noted and stable. Persistent pulmonary vascular congestion with interval development of a interstitial edema is seen. No focal confluent infiltrate is noted. IMPRESSION: Postsurgical changes with tubes and lines as described above. Persistent pulmonary vascular congestion is noted with new interstitial edema. Electronically Signed   By: Inez Catalina M.D.   On: 04/02/2018 08:24   Dg Chest Port 1 View  Addendum Date: 04/01/2018   ADDENDUM REPORT: 04/01/2018 19:23 ADDENDUM: As described within the body of the report, the enteric tube (OG tube) is identified with tip well below the level of the GE junction. The side port of the enteric tube projects over the expected location of the gastric fundus. Electronically Signed   By: Kerby Moors M.D.   On: 04/01/2018 19:23   Addendum Date: 04/01/2018   ADDENDUM REPORT: 04/01/2018 16:50 ADDENDUM: These results were called by telephone at the time of interpretation on 04/01/2018 at 4:50 pm to RN Cherlyn Cushing , who verbally acknowledged these results. Electronically  Signed   By: Kerby Moors M.D.   On: 04/01/2018 16:50   Result Date: 04/01/2018 CLINICAL DATA:  Status post intubation and OG tube placement. EXAM: PORTABLE CHEST 1 VIEW COMPARISON:  03/29/2018 FINDINGS: The ET tube tip appears to curve towards the  right mainstem bronchus. However, the distal tip appears to terminate within the proximal right mainstem bronchus. Right arm PICC line tip projects over the cavoatrial junction. There is an enteric tube with tip below the level of the GE junction. Heart size is enlarged. Pulmonary vascular congestion is improved from previous exam. IMPRESSION: 1. Interval intubation. The tip of the ET tube appears to be within the proximal right mainstem bronchus and may need to be retracted by approximately 2 cm. 2. Cardiac enlargement and pulmonary vascular congestion.  Improved. 3. These results will be called to the ordering clinician or representative by the Radiologist Assistant, and communication documented in the PACS or zVision Dashboard. Electronically Signed: By: Kerby Moors M.D. On: 04/01/2018 15:59     Medications:     Scheduled Medications: . amiodarone  100 mg Oral Daily  . budesonide (PULMICORT) nebulizer solution  0.25 mg Nebulization BID  . chlorhexidine gluconate (MEDLINE KIT)  15 mL Mouth Rinse BID  . Chlorhexidine Gluconate Cloth  6 each Topical Daily  . clopidogrel  75 mg Oral Daily  . ezetimibe  10 mg Oral QHS  . insulin aspart  1-3 Units Subcutaneous Q4H  . ipratropium-albuterol  3 mL Nebulization Q6H  . mouth rinse  15 mL Mouth Rinse 10 times per day  . multivitamin  1 tablet Oral Daily  . pantoprazole sodium  40 mg Per Tube Q1200  . rosuvastatin  40 mg Oral QHS  . sodium chloride flush  10-40 mL Intracatheter Q12H  . vitamin B-12  1,000 mcg Oral Daily    Infusions: . sodium chloride 10 mL/hr at 04/01/18 1725  . ceFEPime (MAXIPIME) IV Stopped (04/01/18 1756)  . fentaNYL infusion INTRAVENOUS 250 mcg/hr (04/02/18 1200)  .  norepinephrine (LEVOPHED) Adult infusion 30 mcg/min (04/02/18 1246)    PRN Medications: sodium chloride, bisacodyl, docusate, fentaNYL, midazolam, nitroGLYCERIN, sodium chloride flush   Assessment:   ATHANASIA STANWOOD is a 73 y.o. female with multiple medical problems including a hx of CAD s/p CABG and multiple PCI, HFpEF s/p CardioMEMS, AF, cerebrovascular disease, CKD, DM2, PAD, admitted 2/22 with acute respiratory failure requiring mechanical intubation in the setting of URI with fever to 102 and cough.   Plan/Discussion:    1. Acute hypoxemic respiratory failure: Suspect combination of pulmonary edema and likely PNA.  Initially intubated, now extubated.  Reintubated on 2/29. CXR with diffuse bilateral airspace disease and PCT 3.08=>2.39 => 0.26.  - Very confusing picture. Clinically and by CXR, recurrent respiratory failure seems to be due to pulmonary edema/HF but initial CVP only 9 and co-ox preserved. Now CVP 3-4 and requiring more pressors so seems more like septic or vasodilatory shock - Will place PA cath to further evaluate.  - Check ESR & auto-immune panels.  ? Hypersensitivity pneumonitis - May need chest CT to further evaluate for component or ARDS 2. Shock - See discussion above. Clincially seems like HF but numbers don't support.  I am increasingly concerned about ARDS and vasodilatory shock - Will place PA cath. May benefit from adding vasopressin  3. ID: Suspect PNA.  Initial fever to 102.6, now afebrile. PCT down to 0.26. WBC 18.1K - Was on levofloxacin.  Now on cefapime per CCM - Bcx and sputum cx from 12/29 NGTD 4. Acute systolic CHF: Echo with EF 25-30%, severe hypokinesis-akinesis of the mid-apical LV myocardium. Prior echo with normal EF.  ?Takotsubo event with stress of acute illness.  However, she has known extensive coronary disease as well.  Good  Co-ox at 75%, CVP 7-8 still.  Creatinine much worse. - see discussion above - given pulmonary edema on CXR and improving  renal function will diurese as tolerated - no b-blocker, ARB/ARNI or spiro with shock/AKI 5. AKI on CKD: stage 3, baseline 1.4-1.8.  - Creatinine peaked at 3.2. Now 2.5 Holding diuretics for now.  5. CAD: Last cath 9/19 with severe native CAD and LIMA patent. Sequential SVG-PLOM/OM1 with 80% proximal stenosis, s/p DES. SVG to RCA chronically occluded. No CP.  Mild elevation in troponin with no trend, likely demand ischemia. Echo with newly low EF, may be Takotsubo.  - Continue statin.  - Plavix restarted 2/26 with plts rising.  - Cath considered with fall in EF. But with renal failure will defer.  6. Atrial fibrillation: Paroxysmal.  Remains in NSR. Off anticoagulation with fall in platelets.  - PLTs now back to 136K. If continue to stabilize can consider starting heparin soon.  7. Thrombocytopenia: ?ITP triggered by acute infection.  Doubt TTP, no schistocytes/evidence for hemolysis.  HIT negative.  Got 2 units plts 2/25.  Given a dose of Solumedrol and 2 doses IVIG.  Plts now up to 136K.  - Hematology has seen 8. Hypokalemia - Will supp.   CRITICAL CARE Performed by: Glori Bickers  Total critical care time: 35 minutes  Critical care time was exclusive of separately billable procedures and treating other patients.  Critical care was necessary to treat or prevent imminent or life-threatening deterioration.  Critical care was time spent personally by me (independent of midlevel providers or residents) on the following activities: development of treatment plan with patient and/or surrogate as well as nursing, discussions with consultants, evaluation of patient's response to treatment, examination of patient, obtaining history from patient or surrogate, ordering and performing treatments and interventions, ordering and review of laboratory studies, ordering and review of radiographic studies, pulse oximetry and re-evaluation of patient's condition.    Glori Bickers, MD 04/02/2018 1:40  PM

## 2018-04-02 NOTE — Progress Notes (Signed)
NAME:  [name] [name], [name]:  [id number], DOB:  05/21/1945, LOS: 19 ADMISSION DATE:  03/25/2018, CONSULTATION DATE:  03/25/2018 REFERRING MD:  ED physician, CHIEF COMPLAINT:  Acute hypoxic respiratory failure   Brief History   72 year old woman with history of asthma, OSA not on CPAP, chronic hypoxic respiratory failure on 2L at night, CAD s/p CABG and multiple PCI, HFpEF s/p CardioMEMS 01/16/2018, AF on Eliquis, cerebrovascular disease, HTN, CKD, DM2, PAD presenting with cough. History obtained from daughter, husband, and EMR due to intubation and sedation. She has been feeling poorly for the past week with cough productive of sputum. She has been having intermittent emesis and post tussive emesis. She has not been able to take her medications because of her emesis. She had a fall yesterday because of weakness. She was noted to be hypoxic to the 70s this afternoon when her daughter checked.   She lives at home with her husband. She is only able to walk a few feet before she gets dyspneic. She just recently got a rollator to help with mobility. Her husband is her 5 but reported to RN 04/02/2018 that patient excludes family from MD visit. Marland Kitchen [place] Sept 2019-> PA 34 / 12 (23) PA 82 10:03:38 PW 19 /16 15 PV  In the ED, pt febrile to 102.6 with SBP 170-209, HR 70s, O2 in the mid 80s. ECG showed sinus rhythm with LBBB-like IVCD. VBG 7.38/50. Labs included Cr 1.34 (at/improved from baseline), BNP 1480 (last 701 in 01/2018), troponin 0.05. Lactate 1.8. WBC 9.4. Flu panel negative. CXR showed cardiomegaly and bilateral infiltrates. She was intubated in the ED and started on IV aztreonam. IV furosemide was given, and NTG infusion was started for BP control.   Past Medical History  asthma, OSA not on CPAP, chronic hypoxic respiratory failure on 2L at night, CAD s/p CABG and multiple PCI, HFpEF s/p CardioMEMS, AF on Eliquis, cerebrovascular disease, HTN, CKD, DM2, PAD  Significant Hospital Events   2/22 > admit,  intubated. Urine with e colii  2/23- echo EF 25-30% with global HK worse from mid-ventricle to apex. RV moderately down. (Echo 1/19 EF 55-60%)  2/25- NAEO. Improved nausea with phenergan. Transferrred and ccm signed off. Plat < 5k). HITT - negtive  2/26 - CT head - nil acute (for ITP - > Received 1 unit of platelets on 03/28/2018 and IVIG 1 mg/kg X 2 doses 03/28/2018 and 03/29/2018  2.27 - platelet upat 74K . Concerne is ITP (not TTP). Ongoing delirum but improved. Cards recommendnig to limit sterods. Cardd ddx - acute IHD s-CHF v Takasubo. Maintained on amio for PAF. Rx with levaquin for CAP NPOS  2/28 - per hospitalist, "The patient is awake but confused this morning.  She denies chest pain shortness breath fevers or chills.  She appears to be resting comfortably.  There is no evidence of acute distress or uncontrolled pain." Platelet improved to 81K - back on Plavix and Eliquis once platelet > 100K. However, . AKI worse  > torsemide and aldactone held and some IVF give  2/29 -  Afebrile but wbc 12.2. CCM called back urgently for resp distress. Per RN was apneic and pale approx 2h agao and responded alertness to BiPAP but uanble to maintain pulse ox beyond 79%, Tachypnenic and paradoxical. Maintains BP. Extremities cool.  -. > moved to 2H and intubated. PEr DR Benismohn morning Coox was 74%. Has worsening renal failure to creat 3.17mg %. Renal US -> no acutte findings. Plat improved to 93K.  Per RN at [facility] -> family feel stopping lasix and starting fluids might have played a role in worsening dyspnea  Consults:  Cardiology 2/22 Hematology  Procedures:  Intubated 2/22>>>   Significant Diagnostic Tests:  CXR 2/22 > Cardiomegaly. Worsening bilateral airspace disease CXR 2/24> pulmonary edema pattern CXR 2/25> worsening RUL opacity   Micro Data:  BCx x 2 2/22 >> no growth 24 hours MRSA pcr 2/23 - neg  Resp cx 2/22 >>  UCx 2/22 >> e coli  Influenza PCR 2/22 >> neg  Antimicrobials:    Aztreonam 2/22 Levaquin 2/22 >> Vancomycin 2/23>>  Cefepime 2/23>>  Interim history/subjective:    04/02/2018 -> afebrile. Plat now > 100K and improved. AKI some better after fluids with CVP 6. Currently on levophed 58mcg -> MAP 69 with sbp 133 (low diast). Coox  86.5 and normal. CVP > 6. On fent gtt . Doing SBT   Objective   Blood pressure (!) 175/63, pulse (!) 58, temperature 98.8 F (37.1 C), temperature source Oral, resp. rate 14, height 5\' 2"  (1.575 m), weight 126.6 kg, SpO2 99 %. CVP:  [2 mmHg-23 mmHg] 6 mmHg  Vent Mode: CPAP;PSV FiO2 (%):  [40 %-100 %] 40 % Set Rate:  [14 bmp] 14 bmp Vt Set:  [410 mL] 410 mL PEEP:  [5 cmH20] 5 cmH20 Pressure Support:  [5 cmH20-10 cmH20] 5 cmH20 Plateau Pressure:  [19 cmH20-23 cmH20] 23 cmH20   Intake/Output Summary (Last 24 hours) at 04/02/2018 1404 Last data filed at 04/02/2018 1209 Gross per 24 hour  Intake 1915.77 ml  Output 1220 ml  Net 695.77 ml   Filed Weights   03/31/18 0319 04/01/18 0500 04/02/18 0500  Weight: 123.8 kg 125.3 kg 126.6 kg    General Appearance:  Looks criticall ill OBESE - + Head:  Normocephalic, without obvious abnormality, atraumatic Eyes:  PERRL - yes, conjunctiva/corneas - clear     Ears:  Normal external ear canals, both ears Nose:  G tube - no Throat:  ETT TUBE - yes , OG tube - yes Neck:  Supple,  No enlargement/tenderness/nodules Lungs: Clear to auscultation bilaterally, Ventilator   Synchrony - yes but doing SBT Heart:  S1 and S2 normal, no murmur, CVP - no.  Pressors - yes levophed 28mc Abdomen:  Soft, no masses, no organomegaly Genitalia / Rectal:  Not done Extremities:  Extremities- intact Skin:  ntact in exposed areas .  Neurologic:  Sedation - fent gtt -> RASS - -3        LABS    PULMONARY Recent Labs  Lab 03/27/18 0637 03/28/18 0455  03/31/18 0335 04/01/18 0531 04/01/18 1505 04/01/18 1648 04/02/18 0620  PHART 7.471* 7.524*  --   --   --   --  7.482*  --   PCO2ART 58.4* 58.0*   --   --   --   --  71.3*  --   PO2ART 64.0* 66.0*  --   --   --   --  365.0*  --   HCO3 42.7* 47.9*  --   --   --   --  53.4*  --   TCO2 44* 50*  --   --   --   --  >50*  --   O2SAT 93.0 94.0   < > 75.7 70.0 75.8 100.0 86.5   < > = values in this interval not displayed.    CBC Recent Labs  Lab 04/01/18 0450 04/01/18 1539 04/01/18 1648 04/02/18 0324  HGB 12.5 13.5  13.6 12.6  HCT 40.1 44.1 40.0 38.7  WBC 12.2* 14.9*  --  18.1*  PLT 93* 117*  --  136*    COAGULATION Recent Labs  Lab 03/28/18 1046  INR 1.5*    CARDIAC   Recent Labs  Lab 04/01/18 1539 04/02/18 0324  TROPONINI 0.10* 0.13*   No results for input(s): PROBNP in the last 168 hours.   CHEMISTRY Recent Labs  Lab 03/27/18 0434  03/28/18 0512  03/29/18 0518  03/31/18 0320 04/01/18 0450 04/01/18 1539 04/01/18 1648 04/01/18 1810 04/02/18 0324  NA 141   < > 141   < > 139   < > 137 134* 136 133* 135 139  K 3.3*   < > 3.1*   < > 2.9*   < > 3.3* 3.3* 3.5 5.2* 3.5 3.1*  CL 92*  --  88*   < > 80*   < > 81* 82* 83*  --  85* 91*  CO2 35*  --  42*   < > 45*   < > 45* 42* 38*  --  41* 37*  GLUCOSE 120*  --  138*   < > 283*   < > 188* 209* 275*  --  280* 115*  BUN 23  --  30*   < > 38*   < > 76* 93* 90*  --  92* 87*  CREATININE 1.71*  --  2.11*   < > 1.79*   < > 2.81* 3.17* 2.94*  --  2.88* 2.53*  CALCIUM 8.2*  --  8.3*   < > 8.4*   < > 9.0 8.7* 8.9  --  8.9 8.8*  MG 1.8  --  2.1  --  2.1  --   --  2.3 2.3  --   --  2.3  PHOS 2.9  --  2.1*  --  4.0  --   --   --  5.2*  --   --  2.1*   < > = values in this interval not displayed.   Estimated Creatinine Clearance: 26.4 mL/min (A) (by C-G formula based on SCr of 2.53 mg/dL (H)).   LIVER Recent Labs  Lab 03/28/18 1046  INR 1.5*     INFECTIOUS Recent Labs  Lab 03/28/18 0512 04/01/18 1539 04/02/18 0324  LATICACIDVEN  --  1.1  --   PROCALCITON 2.39 0.32 0.26     ENDOCRINE CBG (last 3)  Recent Labs    04/02/18 0338 04/02/18 0800 04/02/18 1204   GLUCAP 120* 178* 158*         IMAGING x48h  - image(s) personally visualized  -   highlighted in bold US Renal  Result Date: 04/01/2018 CLINICAL DATA:  Acute kidney failure. EXAM: RENAL / URINARY TRACT ULTRASOUND COMPLETE COMPARISON:  None. FINDINGS: Right Kidney: Renal measurements: 10.6 x 4.9 x 6.1 cm = volume: 165 mL . Echogenicity within normal limits. No mass or hydronephrosis visualized. Left Kidney: Renal measurements: 8.6 x 4.5 x 4.0 cm = volume: 79 mL. Normal parenchymal echogenicity. Diffuse cortical thinning. No mass, stone or hydronephrosis. Bladder: Appears normal for degree of bladder distention. IMPRESSION: 1. No acute finding.  No hydronephrosis. 2. Left renal atrophy/cortical thinning with a reduced renal size. No other abnormalities. Electronically Signed   By: Lajean Manes M.D.   On: 04/01/2018 12:25   Dg Chest Port 1 View  Result Date: 04/02/2018 CLINICAL DATA:  Respiratory failure EXAM: PORTABLE CHEST 1 VIEW COMPARISON:  04/01/2018 FINDINGS: Cardiac shadow remains  enlarged. Postsurgical changes are again seen. Endotracheal tube, gastric catheter and right-sided PICC line are again noted and stable. Persistent pulmonary vascular congestion with interval development of a interstitial edema is seen. No focal confluent infiltrate is noted. IMPRESSION: Postsurgical changes with tubes and lines as described above. Persistent pulmonary vascular congestion is noted with new interstitial edema. Electronically Signed   By: Inez Catalina M.D.   On: 04/02/2018 08:24   Dg Chest Port 1 View  Addendum Date: 04/01/2018   ADDENDUM REPORT: 04/01/2018 19:23 ADDENDUM: As described within the body of the report, the enteric tube (OG tube) is identified with tip well below the level of the GE junction. The side port of the enteric tube projects over the expected location of the gastric fundus. Electronically Signed   By: Kerby Moors M.D.   On: 04/01/2018 19:23   Addendum Date: 04/01/2018    ADDENDUM REPORT: 04/01/2018 16:50 ADDENDUM: These results were called by telephone at the time of interpretation on 04/01/2018 at 4:50 pm to RN Cherlyn Cushing , who verbally acknowledged these results. Electronically Signed   By: Kerby Moors M.D.   On: 04/01/2018 16:50   Result Date: 04/01/2018 CLINICAL DATA:  Status post intubation and OG tube placement. EXAM: PORTABLE CHEST 1 VIEW COMPARISON:  03/29/2018 FINDINGS: The ET tube tip appears to curve towards the right mainstem bronchus. However, the distal tip appears to terminate within the proximal right mainstem bronchus. Right arm PICC line tip projects over the cavoatrial junction. There is an enteric tube with tip below the level of the GE junction. Heart size is enlarged. Pulmonary vascular congestion is improved from previous exam. IMPRESSION: 1. Interval intubation. The tip of the ET tube appears to be within the proximal right mainstem bronchus and may need to be retracted by approximately 2 cm. 2. Cardiac enlargement and pulmonary vascular congestion.  Improved. 3. These results will be called to the ordering clinician or representative by the Radiologist Assistant, and communication documented in the PACS or zVision Dashboard. Electronically Signed: By: Kerby Moors M.D. On: 04/01/2018 15:59     Assessment & Plan:  73 year old woman with history of asthma, OSA not on CPAP, chronic hypoxic respiratory failure on 2L at night, CAD s/p CABG and multiple PCI, HFpEF s/p CardioMEMS, AF on Eliquis, cerebrovascular disease, HTN, CKD, DM2, PAD presenting with cough found in the ED to have fever, hypertensive urgency, and acute on chronic hypoxic respiratory failure.      ASSESSMENT / PLAN:  PULMONARY A:  Acute respiratory failure - intubated 03/25/2018 through 03/27/2018. Reintubated 04/01/2018 (no rever)  04/02/2018 -> does not meet criteria for extubation for acute resp failure though doing sbt . CXR looks wet  P:   PRVC VAP  Bundle    CARDIAC A: #baseline  - CAD s/p CABG and multiple PC - home plavix, asa, zetia, statin, ranexa - A Fib on amio and eliquis at home  #This admit - new acute s-CHF 03/26/2018 ef 25%. Coox normal 04/01/2018 (diureses held 1d earlier) - QTc prolongation :  557msec 04/01/2018   04/02/2018 - appears volume overloaded on 3rd space and cxr but she is actually -17L negative since admit and per RN below dry weight Creat better only with fluids on 04/02/2018  P: Given another fluid challenge 04/02/2018 Check QTc 04/03/18 CHF/cards manging -  ? Ned right heart cath Continue plavix, anti-lipid meds,  Continue amio (needs qtc monitring) Restart eliquis - cards to decide (original plan was when plat > 100K and  patient is now 136K)   VASCULAR A:   Needing pressors 04/02/2018 ? Volume depleted intravascularly  P:  Levophed for MAP > 65 Fluid  Challenge ? Needs right heart cath  RENAL A:  AKI on CKD - getting worse. Renal US 04/01/2018 without hydro. Cardio renal syndrom  04/02/2018 - creat responding to fluids  P:  Foley Monitor  ELECTROLYTES A:  Mild low phos 04/02/2018  P: Replete phos   GASTROINTESTINAL A:   No hx of aspration but on nausea meds  P:   PPI OIG tube DC reglan (QTc risk)  HEMATOLOGIC A:  Suspected ITP  (for ITP - > Received 1 unit of platelets on 03/28/2018 and IVIG 1 mg/kg X 2 doses 03/28/2018 and 03/29/2018)  - 2/29 - platelet improving    P:  - PRBC for hgb </= 6.9gm%    - exceptions are   -  if ACS susepcted/confirmed then transfuse for hgb </= 8.0gm%,  or    -  active bleeding with hemodynamic instability, then transfuse regardless of hemoglobin value   At at all times try to transfuse 1 unit prbc as possible with exception of active hemorrhage  - eliquis when plat > 500k  INFECTIOUS A:   Admitted for presumed CAP. RVP negative./ MRSA pcrt negative. Urine with ECOLII +. So, dx of UTI  P:   Dc levaquin (QTc , tendon and delirium risk) -  done 04/01/2018 cfepime Await Pan culture from 04/01/2018 Recheck sepsis biomarker   ENDOCRINE A:   DM   P:   ICU phase 1 hyperglycemia protocl  NEUROLOGIC A:   Home meds: wellbutrin, cymbalta, gabapentin, xanax, vicodin, robaxin, xanax - SHE IS AT HIGH RISK FOR DELIRIUm   - Delirium on and off since admit - per records slowly improving as of 04/01/2018 but now on sedation gtt 04/02/2018  P:   fent gtt RASS goal o to -2 Hold gabapentin and other home meds since 04/01/2018   GLOBAL Concerned for cardio-renal syndrome and deterioration along with sepsis./HCAP. And CNS polypharmacy   Best practice:  Diet: NPO Pain/Anxiety/Delirium protocol (if indicated): fent gtt/PRN, delirium precautions VAP protocol (if indicated): ordered DVT prophylaxis: SCDs GI prophylaxis: PPI Glucose control: SSI Mobility: as tolerated  Code Status: FULL Family Communication: daughter and husband 04/01/2018. None at bedside Disposition: ICU       ATTESTATION & SIGNATURE   The patient [name] [name] is critically ill with multiple organ systems failure and requires high complexity decision making for assessment and support, frequent evaluation and titration of therapies, application of advanced monitoring technologies and extensive interpretation of multiple databases.   Critical Care Time devoted to patient care services described in this note is  30  Minutes. This time reflects time of care of this signee Dr Brand Males. This critical care time does not reflect procedure time, or teaching time or supervisory time of PA/NP/Med student/Med Resident etc but could involve care discussion time     Dr. Brand Males, M.D., [facility].C.P Pulmonary and Critical Care Medicine Staff Physician [facility] Pager: [phone], If no answer or between  15:00h - 7:00h: call [phone]  04/02/2018 2:22 PM

## 2018-04-03 ENCOUNTER — Ambulatory Visit (HOSPITAL_COMMUNITY): Payer: Medicare Other

## 2018-04-03 ENCOUNTER — Inpatient Hospital Stay (HOSPITAL_COMMUNITY): Payer: Medicare Other

## 2018-04-03 DIAGNOSIS — R579 Shock, unspecified: Secondary | ICD-10-CM

## 2018-04-03 DIAGNOSIS — R6521 Severe sepsis with septic shock: Secondary | ICD-10-CM

## 2018-04-03 DIAGNOSIS — A419 Sepsis, unspecified organism: Secondary | ICD-10-CM

## 2018-04-03 LAB — GLUCOSE, CAPILLARY
Glucose-Capillary: 139 mg/dL — ABNORMAL HIGH (ref 70–99)
Glucose-Capillary: 150 mg/dL — ABNORMAL HIGH (ref 70–99)
Glucose-Capillary: 153 mg/dL — ABNORMAL HIGH (ref 70–99)
Glucose-Capillary: 169 mg/dL — ABNORMAL HIGH (ref 70–99)
Glucose-Capillary: 170 mg/dL — ABNORMAL HIGH (ref 70–99)
Glucose-Capillary: 191 mg/dL — ABNORMAL HIGH (ref 70–99)

## 2018-04-03 LAB — CBC WITH DIFFERENTIAL/PLATELET
Abs Immature Granulocytes: 0.27 10*3/uL — ABNORMAL HIGH (ref 0.00–0.07)
Basophils Absolute: 0.1 10*3/uL (ref 0.0–0.1)
Basophils Relative: 0 %
Eosinophils Absolute: 0.5 10*3/uL (ref 0.0–0.5)
Eosinophils Relative: 3 %
HCT: 36.7 % (ref 36.0–46.0)
Hemoglobin: 11.7 g/dL — ABNORMAL LOW (ref 12.0–15.0)
Immature Granulocytes: 2 %
Lymphocytes Relative: 10 %
Lymphs Abs: 1.6 10*3/uL (ref 0.7–4.0)
MCH: 31.2 pg (ref 26.0–34.0)
MCHC: 31.9 g/dL (ref 30.0–36.0)
MCV: 97.9 fL (ref 80.0–100.0)
Monocytes Absolute: 1.7 10*3/uL — ABNORMAL HIGH (ref 0.1–1.0)
Monocytes Relative: 11 %
Neutro Abs: 11.8 10*3/uL — ABNORMAL HIGH (ref 1.7–7.7)
Neutrophils Relative %: 74 %
Platelets: 137 10*3/uL — ABNORMAL LOW (ref 150–400)
RBC: 3.75 MIL/uL — ABNORMAL LOW (ref 3.87–5.11)
RDW: 13.2 % (ref 11.5–15.5)
WBC: 15.9 10*3/uL — ABNORMAL HIGH (ref 4.0–10.5)
nRBC: 0 % (ref 0.0–0.2)

## 2018-04-03 LAB — COOXEMETRY PANEL
Carboxyhemoglobin: 1.6 % — ABNORMAL HIGH (ref 0.5–1.5)
Methemoglobin: 1.3 % (ref 0.0–1.5)
O2 Saturation: 90.4 %
Total hemoglobin: 11.4 g/dL — ABNORMAL LOW (ref 12.0–16.0)

## 2018-04-03 LAB — BASIC METABOLIC PANEL
Anion gap: 13 (ref 5–15)
BUN: 48 mg/dL — ABNORMAL HIGH (ref 8–23)
CO2: 30 mmol/L (ref 22–32)
Calcium: 8.5 mg/dL — ABNORMAL LOW (ref 8.9–10.3)
Chloride: 101 mmol/L (ref 98–111)
Creatinine, Ser: 1.65 mg/dL — ABNORMAL HIGH (ref 0.44–1.00)
GFR calc Af Amer: 36 mL/min — ABNORMAL LOW (ref >60)
GFR calc non Af Amer: 31 mL/min — ABNORMAL LOW (ref >60)
Glucose, Bld: 182 mg/dL — ABNORMAL HIGH (ref 70–99)
Potassium: 3.9 mmol/L (ref 3.5–5.1)
Sodium: 144 mmol/L (ref 135–145)

## 2018-04-03 LAB — CULTURE, RESPIRATORY W GRAM STAIN: Culture: NO GROWTH

## 2018-04-03 LAB — LEGIONELLA PNEUMOPHILA SEROGP 1 UR AG: L. pneumophila Serogp 1 Ur Ag: NEGATIVE

## 2018-04-03 LAB — PROTIME-INR
INR: 1.3 — ABNORMAL HIGH (ref 0.8–1.2)
Prothrombin Time: 15.9 seconds — ABNORMAL HIGH (ref 11.4–15.2)

## 2018-04-03 LAB — PHOSPHORUS: Phosphorus: 2.4 mg/dL — ABNORMAL LOW (ref 2.5–4.6)

## 2018-04-03 LAB — PROCALCITONIN: Procalcitonin: 0.13 ng/mL

## 2018-04-03 LAB — MAGNESIUM: Magnesium: 2 mg/dL (ref 1.7–2.4)

## 2018-04-03 LAB — TROPONIN I: Troponin I: 0.13 ng/mL (ref <0.03)

## 2018-04-03 MED ORDER — HEPARIN SODIUM (PORCINE) 5000 UNIT/ML IJ SOLN
5000.0000 [IU] | Freq: Three times a day (TID) | INTRAMUSCULAR | Status: DC
  Administered 2018-04-03 – 2018-04-04 (×3): 5000 [IU] via SUBCUTANEOUS
  Filled 2018-04-03 (×3): qty 1

## 2018-04-03 MED ORDER — DULOXETINE HCL 30 MG PO CPEP
30.0000 mg | ORAL_CAPSULE | Freq: Every day | ORAL | Status: DC
  Administered 2018-04-03 – 2018-04-10 (×8): 30 mg via ORAL
  Filled 2018-04-03 (×8): qty 1

## 2018-04-03 MED ORDER — ALPRAZOLAM 0.5 MG PO TABS
0.5000 mg | ORAL_TABLET | Freq: Two times a day (BID) | ORAL | Status: DC
  Administered 2018-04-03 – 2018-04-10 (×14): 0.5 mg via ORAL
  Filled 2018-04-03 (×14): qty 1

## 2018-04-03 MED ORDER — DOCUSATE SODIUM 50 MG/5ML PO LIQD: 50.0000 mg | Freq: Every day | ORAL | Status: DC

## 2018-04-03 MED ORDER — PROCHLORPERAZINE EDISYLATE 10 MG/2ML IJ SOLN
5.0000 mg | Freq: Once | INTRAMUSCULAR | Status: DC
  Filled 2018-04-03 (×2): qty 1

## 2018-04-03 MED ORDER — BISACODYL 5 MG PO TBEC
5.0000 mg | DELAYED_RELEASE_TABLET | Freq: Every day | ORAL | Status: DC
  Administered 2018-04-04 – 2018-04-07 (×4): 5 mg via ORAL
  Filled 2018-04-03 (×5): qty 1

## 2018-04-03 MED ORDER — HYDROCORTISONE 1 % EX CREA
TOPICAL_CREAM | Freq: Three times a day (TID) | CUTANEOUS | Status: DC | PRN
  Filled 2018-04-03: qty 28

## 2018-04-03 NOTE — Progress Notes (Addendum)
[name] [name]   DOB:1945-05-27   ZC#:[id number]   JOI#:[id number]  Hematology follow up note   Subjective: The patient was reintubated on 04/01/2018.  She is back in the cardiac ICU.  She has acute hypoxemic respiratory failure and shock of unclear etiology.  Currently on cefepime.  She is on norepinephrine and vasopressin.  Nursing reports no signs of bleeding. Tmax 99.9 over the past 24 hours.  No family at bedside today.  Objective:  Vitals:   04/03/18 1306 04/03/18 1400  BP: (!) 141/47 (!) 157/60  Pulse: 70 69  Resp: 11 12  Temp:  99.7 F (37.6 C)  SpO2: 100% 100%    Body mass index is 51.05 kg/m.  Intake/Output Summary (Last 24 hours) at 04/03/2018 1505 Last data filed at 04/03/2018 1400 Gross per 24 hour  Intake 2751.12 ml  Output 3675 ml  Net -923.88 ml     Sclerae unicteric  No peripheral adenopathy  Lungs clear -- no rales or rhonchi  Heart regular rate and rhythm  Abdomen benign  Neuro nonfocal  Multiple small ecchymosis on arms and legs  CBG (last 3)  Recent Labs    04/03/18 0458 04/03/18 0826 04/03/18 1322  GLUCAP 169* 170* 191*     Labs:  Lab Results  Component Value Date   WBC 15.9 (H) 04/03/2018   HGB 11.7 (L) 04/03/2018   HCT 36.7 04/03/2018   MCV 97.9 04/03/2018   PLT 137 (L) 04/03/2018   NEUTROABS 11.8 (H) 04/03/2018    CMP Latest Ref Rng & Units 04/03/2018 04/02/2018 04/01/2018  Glucose 70 - 99 mg/dL 182(H) 115(H) 280(H)  BUN 8 - 23 mg/dL 48(H) 87(H) 92(H)  Creatinine 0.44 - 1.00 mg/dL 1.65(H) 2.53(H) 2.88(H)  Sodium 135 - 145 mmol/L 144 139 135  Potassium 3.5 - 5.1 mmol/L 3.9 3.1(L) 3.5  Chloride 98 - 111 mmol/L 101 91(L) 85(L)  CO2 22 - 32 mmol/L 30 37(H) 41(H)  Calcium 8.9 - 10.3 mg/dL 8.5(L) 8.8(L) 8.9  Total Protein 6.5 - 8.1 g/dL - - -  Total Bilirubin 0.3 - 1.2 mg/dL - - -  Alkaline Phos 38 - 126 U/L - - -  AST 15 - 41 U/L - - -  ALT 0 - 44 U/L - - -     Urine Studies No results for input(s): UHGB, CRYS in the last 72  hours.  Invalid input(s): UACOL, UAPR, USPG, UPH, UTP, UGL, UKET, UBIL, UNIT, UROB, ULEU, UEPI, UWBC, Junie Panning [place], [place], [place]  Basic Metabolic Panel: Recent Labs  Lab 03/28/18 [phone]  03/29/18 0518  04/01/18 0450 04/01/18 1539 04/01/18 1648 04/01/18 1810 04/02/18 0324 04/03/18 0444  NA 141   < > 139   < > 134* 136 133* 135 139 144  K 3.1*   < > 2.9*   < > 3.3* 3.5 5.2* 3.5 3.1* 3.9  CL 88*   < > 80*   < > 82* 83*  --  85* 91* 101  CO2 42*   < > 45*   < > 42* 38*  --  41* 37* 30  GLUCOSE 138*   < > 283*   < > 209* 275*  --  280* 115* 182*  BUN 30*   < > 38*   < > 93* 90*  --  92* 87* 48*  CREATININE 2.11*   < > 1.79*   < > 3.17* 2.94*  --  2.88* 2.53* 1.65*  CALCIUM 8.3*   < > 8.4*   < >  8.7* 8.9  --  8.9 8.8* 8.5*  MG 2.1  --  2.1  --  2.3 2.3  --   --  2.3 2.0  PHOS 2.1*  --  4.0  --   --  5.2*  --   --  2.1* 2.4*   < > = values in this interval not displayed.   GFR Estimated Creatinine Clearance: 40.4 mL/min (A) (by C-G formula based on SCr of 1.65 mg/dL (H)). Liver Function Tests: No results for input(s): AST, ALT, ALKPHOS, BILITOT, PROT, ALBUMIN in the last 168 hours. No results for input(s): LIPASE, AMYLASE in the last 168 hours. No results for input(s): AMMONIA in the last 168 hours. Coagulation profile Recent Labs  Lab 03/28/18 1046 04/03/18 0444  INR 1.5* 1.3*    CBC: Recent Labs  Lab 03/31/18 0320 04/01/18 0450 04/01/18 1539 04/01/18 1648 04/02/18 0324 04/03/18 0444  WBC 11.1* 12.2* 14.9*  --  18.1* 15.9*  NEUTROABS  --   --  12.3*  --   --  11.8*  HGB 12.5 12.5 13.5 13.6 12.6 11.7*  HCT 41.8 40.1 44.1 40.0 38.7 36.7  MCV 99.5 97.1 96.3  --  94.9 97.9  PLT 81* 93* 117*  --  136* 137*   Cardiac Enzymes: Recent Labs  Lab 04/01/18 1539 04/02/18 0324 04/03/18 0444  TROPONINI 0.10* 0.13* 0.13*   BNP: Invalid input(s): POCBNP CBG: Recent Labs  Lab 04/02/18 2005 04/03/18 0003 04/03/18 0458 04/03/18 0826 04/03/18 1322  GLUCAP 160* 150*  169* 170* 191*   D-Dimer No results for input(s): DDIMER in the last 72 hours. Hgb A1c No results for input(s): HGBA1C in the last 72 hours. Lipid Profile No results for input(s): CHOL, HDL, LDLCALC, TRIG, CHOLHDL, LDLDIRECT in the last 72 hours. Thyroid function studies No results for input(s): TSH, T4TOTAL, T3FREE, THYROIDAB in the last 72 hours.  Invalid input(s): FREET3 Anemia work up No results for input(s): VITAMINB12, FOLATE, FERRITIN, TIBC, IRON, RETICCTPCT in the last 72 hours. Microbiology Recent Results (from the past 240 hour(s))  Urine culture     Status: Abnormal   Collection Time: 03/25/18  4:18 PM  Result Value Ref Range Status   Specimen Description URINE, RANDOM  Final   Special Requests   Final    NONE Performed at [facility], [address] [place], [place] [address]    Culture 10,000 COLONIES/mL ESCHERICHIA COLI (A)  Final   Report Status 03/27/2018 FINAL  Final   Organism ID, Bacteria ESCHERICHIA COLI (A)  Final      Susceptibility   Escherichia coli - MIC*    AMPICILLIN <=2 SENSITIVE Sensitive     CEFAZOLIN <=4 SENSITIVE Sensitive     CEFTRIAXONE <=1 SENSITIVE Sensitive     CIPROFLOXACIN <=0.25 SENSITIVE Sensitive     GENTAMICIN <=1 SENSITIVE Sensitive     IMIPENEM <=0.25 SENSITIVE Sensitive     NITROFURANTOIN <=16 SENSITIVE Sensitive     TRIMETH/SULFA <=20 SENSITIVE Sensitive     AMPICILLIN/SULBACTAM <=2 SENSITIVE Sensitive     PIP/TAZO <=4 SENSITIVE Sensitive     Extended ESBL NEGATIVE Sensitive     * 10,000 COLONIES/mL ESCHERICHIA COLI  Culture, blood (routine x 2)     Status: None   Collection Time: 03/25/18  4:18 PM  Result Value Ref Range Status   Specimen Description BLOOD RIGHT ANTECUBITAL  Final   Special Requests   Final    BOTTLES DRAWN AEROBIC AND ANAEROBIC Blood Culture results may not be optimal  due to an inadequate volume of blood received in culture bottles   Culture   Final    NO GROWTH 5 DAYS Performed at [facility], [place] [address], [place], [place] [address]    Report Status 03/30/2018 FINAL  Final  Culture, blood (routine x 2)     Status: None   Collection Time: 03/25/18  4:23 PM  Result Value Ref Range Status   Specimen Description BLOOD LEFT ANTECUBITAL  Final   Special Requests   Final    AEROBIC BOTTLE ONLY Blood Culture results may not be optimal due to an inadequate volume of blood received in culture bottles   Culture   Final    NO GROWTH 5 DAYS Performed at [facility], [place] [address], [place], [place] [address]    Report Status 03/30/2018 FINAL  Final  MRSA PCR Screening     Status: None   Collection Time: 03/26/18 12:00 AM  Result Value Ref Range Status   MRSA by PCR NEGATIVE NEGATIVE Final    Comment:        The GeneXpert MRSA Assay (FDA approved for NASAL specimens only), is one component of a comprehensive MRSA colonization surveillance program. It is not intended to diagnose MRSA infection nor to guide or monitor treatment for MRSA infections. Performed at [facility], [place] [address] [place], [place] [address]   Culture, respiratory (non-expectorated)     Status: None   Collection Time: 03/27/18  8:46 AM  Result Value Ref Range Status   Specimen Description TRACHEAL ASPIRATE  Final   Special Requests NONE  Final   Gram Stain   Final    ABUNDANT WBC PRESENT, PREDOMINANTLY PMN NO ORGANISMS SEEN    Culture   Final    FEW Consistent with normal respiratory flora. Performed at [facility], [place] [address] [place],  [address]    Report Status 03/29/2018 FINAL  Final  Culture, blood (Routine X 2) w Reflex to ID Panel     Status: None (Preliminary result)   Collection Time: 04/01/18  4:30 PM  Result Value Ref Range Status   Specimen Description BLOOD SITE NOT SPECIFIED  Final   Special Requests   Final    BOTTLES DRAWN AEROBIC AND ANAEROBIC Blood Culture adequate volume   Culture NO GROWTH 2 DAYS  Final   Report Status PENDING   Incomplete  Culture, blood (Routine X 2) w Reflex to ID Panel     Status: None (Preliminary result)   Collection Time: 04/01/18  4:41 PM  Result Value Ref Range Status   Specimen Description BLOOD LEFT WRIST  Final   Special Requests AEROBIC BOTTLE ONLY Blood Culture adequate volume  Final   Culture NO GROWTH 2 DAYS  Final   Report Status PENDING  Incomplete  Culture, Urine     Status: None   Collection Time: 04/01/18  5:05 PM  Result Value Ref Range Status   Specimen Description URINE, RANDOM  Final   Special Requests Immunocompromised  Final   Culture   Final    NO GROWTH Performed at [facility], [place] [address], [place],  [address]    Report Status 04/02/2018 FINAL  Final  Culture, respiratory (non-expectorated)     Status: None   Collection Time: 04/01/18  5:05 PM  Result Value Ref Range Status   Specimen Description TRACHEAL ASPIRATE  Final   Special Requests Immunocompromised  Final   Gram Stain  Final    RARE WBC PRESENT, PREDOMINANTLY MONONUCLEAR NO ORGANISMS SEEN    Culture   Final    NO GROWTH 2 DAYS Performed at [place] [address] [place], [place] [address]    Report Status 04/03/2018 FINAL  Final      Studies:  Ct Chest Wo Contrast  Result Date: 04/03/2018 CLINICAL DATA:  Inpatient. Respiratory failure. Intubated. Clinical differential includes pneumonia, ARDS and amiodarone toxicity. EXAM: CT CHEST WITHOUT CONTRAST TECHNIQUE: Multidetector CT imaging of the chest was performed following the standard protocol without IV contrast. COMPARISON:  Chest radiograph from earlier today. FINDINGS: Cardiovascular: Mild-to-moderate cardiomegaly. No significant pericardial effusion/thickening. Three-vessel coronary atherosclerosis status post CABG. Right PICC terminates at the cavoatrial junction. Right internal jugular Swan-Ganz catheter terminates within a segmental right lower lobe pulmonary artery branch. Atherosclerotic nonaneurysmal thoracic  aorta. Dilated main pulmonary artery (3.6 cm diameter). Mediastinum/Nodes: Mildly heterogeneous thyroid gland with scattered tiny internal calcifications and no discrete thyroid nodules. Enteric tube terminates in the proximal stomach. Otherwise unremarkable esophagus. No axillary adenopathy. Mild right paratracheal adenopathy up to 1.2 cm (series 4/image 45). No discrete hilar adenopathy on this noncontrast scan. Lungs/Pleura: No pneumothorax. No pleural effusion. Endotracheal tube tip is 1.8 cm above the carina. Bandlike consolidation in bilateral dependent lower lobes with associated mild volume loss, compatible with mild atelectasis. No lung masses or significant pulmonary nodules in the aerated portions of the lungs. There is patchy ground-glass opacity and mild patchy foci of peribronchovascular consolidation throughout both lungs involving all lung lobes. Minimal scattered interlobular septal thickening. No significant regions of bronchiectasis, architectural distortion or frank honeycombing. Upper abdomen: Cholecystectomy. Musculoskeletal: No aggressive appearing focal osseous lesions. Intact sternotomy wires. Moderate thoracic spondylosis. IMPRESSION: 1. Right internal jugular Swan-Ganz catheter terminates within a segmental right lower lobe pulmonary artery branch and should be retracted approximately 5 cm. 2.  Endotracheal tube tip is 1.8 cm above the carina. 3. Mild-to-moderate cardiomegaly. 4. Dilated main pulmonary artery, suggesting pulmonary arterial hypertension. 5. Patchy ground-glass opacity and mild patchy foci of peribronchovascular consolidation throughout both lungs. These findings are nonspecific, with the differential including bronchopneumonia or pulmonary edema. No specific findings of interstitial lung disease. 6. Nonspecific mild right paratracheal adenopathy. No follow-up is required unless as otherwise clinically warranted. This recommendation follows ACR consensus guidelines: Managing  Incidental Findings on Thoracic CT: Mediastinal and Cardiovascular Findings. A White Paper of the ACR Incidental Findings Committee. J Am Coll Radiol. 2018; 15: 8527-7824. Aortic Atherosclerosis (ICD10-I70.0). These results will be called to the ordering clinician or representative by the Radiologist Assistant, and communication documented in the PACS or zVision Dashboard. Electronically Signed   By: Ilona Sorrel M.D.   On: 04/03/2018 13:22   Dg Chest Port 1 View  Result Date: 04/03/2018 CLINICAL DATA:  endotracheal tube present EXAM: PORTABLE CHEST 1 VIEW COMPARISON:  04/02/2018 FINDINGS: Endotracheal tube is in place, tip approximately 2.7 centimeters above the carina. Nasogastric tube is in place, tip beyond the image below the level of the proximal stomach. Swan-Ganz catheter tip overlies the RIGHT LOWER lobe pulmonary artery. Median sternotomy and CABG. RIGHT PICC line tip overlies the superior vena cava. Heart is enlarged. There is streaky perihilar atelectasis on the LEFT. Pulmonary vascular congestion without overt edema. IMPRESSION: 1. Cardiomegaly and vascular congestion. 2. Endotracheal tube is in place, tip approximately 2.7 centimeters above the carina. Electronically Signed   By: Nolon Nations M.D.   On: 04/03/2018 07:46   Dg Chest [facility]  Result Date: 04/02/2018 CLINICAL DATA:  Central line placement EXAM: PORTABLE CHEST 1 VIEW COMPARISON:  04/02/2018 and prior radiographs FINDINGS: An endotracheal tube with tip 4.5 cm above the carina, NG tube entering the stomach with tip off the field of view and RIGHT PICC line with tip overlying the LOWER SVC again noted. A RIGHT IJ Swan-Ganz catheter has been placed with tip overlying the RIGHT interlobar pulmonary artery. Cardiomegaly, CABG changes and interstitial pulmonary edema again noted. LEFT basilar atelectasis with probable small LEFT pleural effusion again noted. No pneumothorax. IMPRESSION: RIGHT IJ Swan-Ganz catheter placement.  No  pneumothorax. No other significant change. Electronically Signed   By: Margarette Canada M.D.   On: 04/02/2018 17:25   Dg Chest Port 1 View  Result Date: 04/02/2018 CLINICAL DATA:  Respiratory failure EXAM: PORTABLE CHEST 1 VIEW COMPARISON:  04/01/2018 FINDINGS: Cardiac shadow remains enlarged. Postsurgical changes are again seen. Endotracheal tube, gastric catheter and right-sided PICC line are again noted and stable. Persistent pulmonary vascular congestion with interval development of a interstitial edema is seen. No focal confluent infiltrate is noted. IMPRESSION: Postsurgical changes with tubes and lines as described above. Persistent pulmonary vascular congestion is noted with new interstitial edema. Electronically Signed   By: Inez Catalina M.D.   On: 04/02/2018 08:24   Dg Chest Port 1 View  Addendum Date: 04/01/2018   ADDENDUM REPORT: 04/01/2018 19:23 ADDENDUM: As described within the body of the report, the enteric tube (OG tube) is identified with tip well below the level of the GE junction. The side port of the enteric tube projects over the expected location of the gastric fundus. Electronically Signed   By: Kerby Moors M.D.   On: 04/01/2018 19:23   Addendum Date: 04/01/2018   ADDENDUM REPORT: 04/01/2018 16:50 ADDENDUM: These results were called by telephone at the time of interpretation on 04/01/2018 at 4:50 pm to RN Cherlyn Cushing , who verbally acknowledged these results. Electronically Signed   By: Kerby Moors M.D.   On: 04/01/2018 16:50   Result Date: 04/01/2018 CLINICAL DATA:  Status post intubation and OG tube placement. EXAM: PORTABLE CHEST 1 VIEW COMPARISON:  03/29/2018 FINDINGS: The ET tube tip appears to curve towards the right mainstem bronchus. However, the distal tip appears to terminate within the proximal right mainstem bronchus. Right arm PICC line tip projects over the cavoatrial junction. There is an enteric tube with tip below the level of the GE junction. Heart size is enlarged.  Pulmonary vascular congestion is improved from previous exam. IMPRESSION: 1. Interval intubation. The tip of the ET tube appears to be within the proximal right mainstem bronchus and may need to be retracted by approximately 2 cm. 2. Cardiac enlargement and pulmonary vascular congestion.  Improved. 3. These results will be called to the ordering clinician or representative by the Radiologist Assistant, and communication documented in the PACS or zVision Dashboard. Electronically Signed: By: Kerby Moors M.D. On: 04/01/2018 15:59    Assessment: 73 y.o. with past medical history of asthma, OSA, on home oxygen 2 L at night, coronary artery disease, status post CABG and multiple PCI, atrial fibrillation on Eliquis, CVA, hypertension, CKD, diabetes, presented with fever and cough, hypertension urgency, acute on chronic respite failure required intubation, with consult for thrombocytopenia with platelet less than 5k  1. Thrombocytopenia, probable infection induced ITP, vs sepsis induced thrombocytopenia  2.  Acute on chronic hypoxic respite failure, reintubated, improving  3.  Acute delirium, CT head negative for acute findings 4.  Acute  on chronic systolic congestive heart failure 5. Acute on chronic renal failure, improving  6. HTN, CAD, AF 7.  Community-acquired pneumonia 8. DM with hyperglycemia     Plan:  -Her thrombocytopenia is improving nicely, platelet count 137K this morning.  She has received IVIG 1 mg/kg daily for 2 days, and responded well.    -other management per primary team -we will f/u    Mikey Bussing, NP 04/03/2018  3:05 PM   Addendum  I have seen the patient, examined her. I agree with the assessment and and plan and have edited the notes.   Chart reviewed, events noticed. Lab reviewed, thrombocytopenia overall has much improved, plt 137K today. Noticed that cardiology has sent autoimmune panel, including MM panel (unlikely MM given no anemia), will f/u the results. HIT  was ruled out, OK to restart hepatin for Eliquis if needed for AF. Continue f/u CBC, if plt drops again, please check DIC panel again. We will f/u as needed.  Truitt Merle  04/03/2018

## 2018-04-03 NOTE — Procedures (Signed)
Extubation Procedure Note  Patient Details:   Name: [name] [name] DOB: 1945-07-01 [name]: [id number]   Airway Documentation:    Vent end date: 04/03/18 Vent end time: 1515   Evaluation  O2 sats: stable throughout Complications: No apparent complications Patient did tolerate procedure well. Bilateral Breath Sounds: Clear, Diminished   Yes   Positive cuff leak noted.  Pt placed on [place] 4 L with humidity, no striidor noted. Pt able to reach 375 mL using incentive spirometer.  Bayard Beaver 04/03/2018, 3:38 PM

## 2018-04-03 NOTE — Progress Notes (Signed)
Inpatient Diabetes Program Recommendations  AACE/ADA: New Consensus Statement on Inpatient Glycemic Control (2015)  Target Ranges:  Prepandial:   less than 140 mg/dL      Peak postprandial:   less than 180 mg/dL (1-2 hours)      Critically ill patients:  140 - 180 mg/dL   Lab Results  Component Value Date   GLUCAP 191 (H) 04/03/2018   HGBA1C 8.0 (H) 10/21/2017    Review of Glycemic Control Results for SATONYA, LUX ([name] [id number]) as of 04/03/2018 13:53  Ref. Range 04/01/2018 12:36 04/01/2018 16:03 04/01/2018 20:08 04/01/2018 23:35 04/02/2018 03:38 04/02/2018 08:00 04/02/2018 12:04 04/02/2018 17:51 04/02/2018 20:05 04/03/2018 00:03 04/03/2018 04:58 04/03/2018 08:26 04/03/2018 13:22  Glucose-Capillary Latest Ref Range: 70 - 99 mg/dL 201 (H) 265 (H) 211 (H) 150 (H) 120 (H) 178 (H) 158 (H) 157 (H) 160 (H) 150 (H) 169 (H) 170 (H) 191 (H)   Diabetes history: DM 2 Outpatient Diabetes medications:  Lantus 50 units q HS, Humalog 18-26 units tid with meals Current orders for Inpatient glycemic control:  Novolog 1-2-3 q 4 hours Inpatient Diabetes Program Recommendations:   If appropriate, consider adding Lantus 10 units daily.   Thanks  Adah Perl, RN, BC-ADM Inpatient Diabetes Coordinator Pager [phone] (8a-5p)

## 2018-04-03 NOTE — Progress Notes (Addendum)
NAME:  [name] [name], [name]:  [id number], DOB:  31-Jul-1945, LOS: 9 ADMISSION DATE:  03/25/2018, CONSULTATION DATE:  03/25/2018 REFERRING MD:  ED physician, CHIEF COMPLAINT:  Acute hypoxic respiratory failure   Brief History   73 year old woman with history of asthma, OSA not on CPAP, chronic hypoxic respiratory failure on 2L at night, CAD s/p CABG and multiple PCI, HFpEF s/p CardioMEMS 01/16/2018, AF on Eliquis, cerebrovascular disease, HTN, CKD, DM2, PAD presenting with cough. History obtained from daughter, husband, and EMR due to intubation and sedation. She has been feeling poorly for the past week with cough productive of sputum. She has been having intermittent emesis and post tussive emesis. She has not been able to take her medications because of her emesis. She had a fall yesterday because of weakness. She was noted to be hypoxic to the 70s this afternoon when her daughter checked.   She lives at home with her husband. She is only able to walk a few feet before she gets dyspneic. She just recently got a rollator to help with mobility. Her husband is her 40 but reported to RN 04/02/2018 that patient excludes family from MD visit. Marland Kitchen [place] Sept 2019-> PA 34 / 12 (23) PA 82 10:03:38 PW 19 /16 15 PV  In the ED, pt febrile to 102.6 with SBP 170-209, HR 70s, O2 in the mid 80s. ECG showed sinus rhythm with LBBB-like IVCD. VBG 7.38/50. Labs included Cr 1.34 (at/improved from baseline), BNP 1480 (last 701 in 01/2018), troponin 0.05. Lactate 1.8. WBC 9.4. Flu panel negative. CXR showed cardiomegaly and bilateral infiltrates. She was intubated in the ED and started on IV aztreonam. IV furosemide was given, and NTG infusion was started for BP control.   Past Medical History  asthma, OSA not on CPAP, chronic hypoxic respiratory failure on 2L at night, CAD s/p CABG and multiple PCI, HFpEF s/p CardioMEMS, AF on Eliquis, cerebrovascular disease, HTN, CKD, DM2, PAD  Significant Hospital Events   2/22 > admit,  intubated. Urine with e colii 2/23- echo EF 25-30% with global HK worse from mid-ventricle to apex. RV moderately down. (Echo 1/19 EF 55-60%) 2/25- NAEO. Improved nausea with phenergan. Transferrred and ccm signed off. Plat < 5k). HITT - negtive 2/26 - CT head - nil acute (for ITP - > Received 1 unit of platelets on 03/28/2018 and IVIG 1 mg/kg X 2 doses 03/28/2018 and 03/29/2018 2.27 - platelet upat 74K . Concerne is ITP (not TTP). Ongoing delirum but improved. Cards recommendnig to limit sterods. Cardd ddx - acute IHD s-CHF v Takasubo. Maintained on amio for PAF. Rx with levaquin for CAP NPOS 2/28 extubated 2/29 re-intubated  Consults:  Cardiology 2/22 Hematology  Procedures:  Intubated 2/22>2/24, 2/29 >> PA cath 3/1 >>  Significant Diagnostic Tests:  CXR 2/22 > Cardiomegaly. Worsening bilateral airspace disease CXR 2/24> pulmonary edema pattern CXR 2/25> worsening RUL opacity   Micro Data:  BCx x 2 2/22 >> no growth 24 hours MRSA pcr 2/23 - neg  Resp cx 2/22 >>  UCx 2/22 >> e coli  Influenza PCR 2/22 >> neg 2/29 blood >>> 2/29 URine > neg 2/29 > sputum neg  Antimicrobials:  Aztreonam 2/22 Levaquin 2/23 Vancomycin 2/23 Cefepime 2/23>>  Interim history/subjective:    Swan placed yesterday with low SVR raising concern for septic shock. Remains on 1mcg norepi and shock dose vaso.   Objective   Blood pressure (!) 130/46, pulse 69, temperature 99.3 F (37.4 C), resp. rate 19, height 5\' 2"  (  1.575 m), weight 126.6 kg, SpO2 100 %. PAP: (16-60)/(9-35) 30/19 CVP:  [4 mmHg-6 mmHg] 6 mmHg PCWP:  [10 mmHg] 10 mmHg CO:  [6.9 L/min-8.3 L/min] 8.3 L/min CI:  [3.1 L/min/m2-3.8 L/min/m2] 3.8 L/min/m2  Vent Mode: PRVC FiO2 (%):  [40 %] 40 % Set Rate:  [14 bmp] 14 bmp Vt Set:  [400 mL-410 mL] 400 mL PEEP:  [5 cmH20] 5 cmH20 Pressure Support:  [8 cmH20] 8 cmH20 Plateau Pressure:  [13 cmH20-22 cmH20] 19 cmH20   Intake/Output Summary (Last 24 hours) at 04/03/2018 1159 Last data filed  at 04/03/2018 1100 Gross per 24 hour  Intake 3212.6 ml  Output 3500 ml  Net -287.4 ml   Filed Weights   03/31/18 0319 04/01/18 0500 04/02/18 0500  Weight: 123.8 kg 125.3 kg 126.6 kg    General:  Elderly female on vent.  Neuro:  Awake, alert, interacts appropriately.  HEENT:  [place]/AT, No JVD noted, PERRL Cardiovascular:  RRR, no MRG Lungs:  Clear bilateral breath sounds Abdomen:  Soft, non-distended, mildly tender.  Musculoskeletal:  No acute deformity Skin:  Intact, MMM   LABS    PULMONARY Recent Labs  Lab 03/28/18 0455  04/01/18 1505 04/01/18 1648 04/02/18 0620 04/02/18 1545 04/03/18 0654  PHART 7.524*  --   --  7.482*  --   --   --   PCO2ART 58.0*  --   --  71.3*  --   --   --   PO2ART 66.0*  --   --  365.0*  --   --   --   HCO3 47.9*  --   --  53.4*  --   --   --   TCO2 50*  --   --  >50*  --   --   --   O2SAT 94.0   < > 75.8 100.0 86.5 88.2 90.4   < > = values in this interval not displayed.    CBC Recent Labs  Lab 04/01/18 1539 04/01/18 1648 04/02/18 0324 04/03/18 0444  HGB 13.5 13.6 12.6 11.7*  HCT 44.1 40.0 38.7 36.7  WBC 14.9*  --  18.1* 15.9*  PLT 117*  --  136* 137*    COAGULATION Recent Labs  Lab 03/28/18 1046 04/03/18 0444  INR 1.5* 1.3*    CARDIAC   Recent Labs  Lab 04/01/18 1539 04/02/18 0324 04/03/18 0444  TROPONINI 0.10* 0.13* 0.13*   No results for input(s): PROBNP in the last 168 hours.   CHEMISTRY Recent Labs  Lab 03/28/18 0512  03/29/18 0518  04/01/18 0450 04/01/18 1539 04/01/18 1648 04/01/18 1810 04/02/18 0324 04/03/18 0444  NA 141   < > 139   < > 134* 136 133* 135 139 144  K 3.1*   < > 2.9*   < > 3.3* 3.5 5.2* 3.5 3.1* 3.9  CL 88*   < > 80*   < > 82* 83*  --  85* 91* 101  CO2 42*   < > 45*   < > 42* 38*  --  41* 37* 30  GLUCOSE 138*   < > 283*   < > 209* 275*  --  280* 115* 182*  BUN 30*   < > 38*   < > 93* 90*  --  92* 87* 48*  CREATININE 2.11*   < > 1.79*   < > 3.17* 2.94*  --  2.88* 2.53* 1.65*  CALCIUM  8.3*   < > 8.4*   < > 8.7* 8.9  --  8.9 8.8* 8.5*  MG 2.1  --  2.1  --  2.3 2.3  --   --  2.3 2.0  PHOS 2.1*  --  4.0  --   --  5.2*  --   --  2.1* 2.4*   < > = values in this interval not displayed.   Estimated Creatinine Clearance: 40.4 mL/min (A) (by C-G formula based on SCr of 1.65 mg/dL (H)).   LIVER Recent Labs  Lab 03/28/18 1046 04/03/18 0444  INR 1.5* 1.3*     INFECTIOUS Recent Labs  Lab 04/01/18 1539 04/02/18 0324 04/03/18 0444  LATICACIDVEN 1.1  --   --   PROCALCITON 0.32 0.26 0.13     ENDOCRINE CBG (last 3)  Recent Labs    04/03/18 0003 04/03/18 0458 04/03/18 0826  GLUCAP 150* 169* 170*    IMAGING x48h  - image(s) personally visualized  -   highlighted in bold Dg Chest Port 1 View  Result Date: 04/03/2018 CLINICAL DATA:  endotracheal tube present EXAM: PORTABLE CHEST 1 VIEW COMPARISON:  04/02/2018 FINDINGS: Endotracheal tube is in place, tip approximately 2.7 centimeters above the carina. Nasogastric tube is in place, tip beyond the image below the level of the proximal stomach. Swan-Ganz catheter tip overlies the RIGHT LOWER lobe pulmonary artery. Median sternotomy and CABG. RIGHT PICC line tip overlies the superior vena cava. Heart is enlarged. There is streaky perihilar atelectasis on the LEFT. Pulmonary vascular congestion without overt edema. IMPRESSION: 1. Cardiomegaly and vascular congestion. 2. Endotracheal tube is in place, tip approximately 2.7 centimeters above the carina. Electronically Signed   By: Nolon Nations M.D.   On: 04/03/2018 07:46   Dg Chest Port 1 View  Result Date: 04/02/2018 CLINICAL DATA:  Central line placement EXAM: PORTABLE CHEST 1 VIEW COMPARISON:  04/02/2018 and prior radiographs FINDINGS: An endotracheal tube with tip 4.5 cm above the carina, NG tube entering the stomach with tip off the field of view and RIGHT PICC line with tip overlying the LOWER SVC again noted. A RIGHT IJ Swan-Ganz catheter has been placed with tip  overlying the RIGHT interlobar pulmonary artery. Cardiomegaly, CABG changes and interstitial pulmonary edema again noted. LEFT basilar atelectasis with probable small LEFT pleural effusion again noted. No pneumothorax. IMPRESSION: RIGHT IJ Swan-Ganz catheter placement.  No pneumothorax. No other significant change. Electronically Signed   By: Margarette Canada M.D.   On: 04/02/2018 17:25   Dg Chest Port 1 View  Result Date: 04/02/2018 CLINICAL DATA:  Respiratory failure EXAM: PORTABLE CHEST 1 VIEW COMPARISON:  04/01/2018 FINDINGS: Cardiac shadow remains enlarged. Postsurgical changes are again seen. Endotracheal tube, gastric catheter and right-sided PICC line are again noted and stable. Persistent pulmonary vascular congestion with interval development of a interstitial edema is seen. No focal confluent infiltrate is noted. IMPRESSION: Postsurgical changes with tubes and lines as described above. Persistent pulmonary vascular congestion is noted with new interstitial edema. Electronically Signed   By: Inez Catalina M.D.   On: 04/02/2018 08:24   Dg Chest Port 1 View  Addendum Date: 04/01/2018   ADDENDUM REPORT: 04/01/2018 19:23 ADDENDUM: As described within the body of the report, the enteric tube (OG tube) is identified with tip well below the level of the GE junction. The side port of the enteric tube projects over the expected location of the gastric fundus. Electronically Signed   By: Kerby Moors M.D.   On: 04/01/2018 19:23   Addendum Date: 04/01/2018   ADDENDUM REPORT: 04/01/2018 16:50 ADDENDUM: These  results were called by telephone at the time of interpretation on 04/01/2018 at 4:50 pm to [facility] , who verbally acknowledged these results. Electronically Signed   By: Kerby Moors M.D.   On: 04/01/2018 16:50   Result Date: 04/01/2018 CLINICAL DATA:  Status post intubation and OG tube placement. EXAM: PORTABLE CHEST 1 VIEW COMPARISON:  03/29/2018 FINDINGS: The ET tube tip appears to curve towards the  right mainstem bronchus. However, the distal tip appears to terminate within the proximal right mainstem bronchus. Right arm PICC line tip projects over the cavoatrial junction. There is an enteric tube with tip below the level of the GE junction. Heart size is enlarged. Pulmonary vascular congestion is improved from previous exam. IMPRESSION: 1. Interval intubation. The tip of the ET tube appears to be within the proximal right mainstem bronchus and may need to be retracted by approximately 2 cm. 2. Cardiac enlargement and pulmonary vascular congestion.  Improved. 3. These results will be called to the ordering clinician or representative by the Radiologist Assistant, and communication documented in the PACS or zVision Dashboard. Electronically Signed: By: Kerby Moors M.D. On: 04/01/2018 15:59     Assessment & Plan:   Acute hypoxemic respiratory failure - intubated 03/25/2018 through 03/27/2018. Reintubated 04/01/2018. Etiology not entirely clear. Diffuse infiltrate with concern for edema vs infection vs inflammatory process. S/p treatment with antibiotics. Concern for amiodarone toxicity. Overall she is improved.   -PRVC - Wean as tolerated. Hopeful for wean > extubation after CT - CT chest w/o contrast to better characterize infiltrates.  - 17L negative - VAP Bundle - Fentanyl infusion for sedation. RASS goal 0 in hopes to wean.   Shock: etiology not entirely clear. PA cath placed 3/2 describes adequate CO/CI and low SVR indicating distributive shock. Suspecting sepsis.  - Continue norepi, vasopressin for MAP goal 81mmHG - Follow hemodynamic monitoring  CAP and E. Coli UTI - Continue cefepime - PCT reassuring, but still needing pressors and unable to wean from vent. Continue for at least one more day. - Cultures pending  Acute HFrEF:  LVEF 25%. She has extensive cardiac history, which is the likely culprit in the setting of critical illness, however, HF mentions some concern of  Takotsubo. - Holding diuresis for CVP 6.  - Heart failure service following - PA cath in place.   CAD s/p CABG  - continue home statin, plavix.    A Fib on amio and eliquis at home. Currently sinus.  - Resume heparin once platelets stabilize. - Continue PO amiodarone for now.   QTc prolongation :  519msec 04/01/2018 - monitor  AKI on CKD - Creatinine improving. Renal US 04/01/18 without hydro. - improved with volume - keep even for now until CT - keep foley  Nausea - DC reglan - hold TF, hope she can extubate today  Thrombocytopenia:  Suspected ITP  (for ITP - > Received 1 unit of platelets on 03/28/2018 and IVIG 1 mg/kg X 2 doses 03/28/2018 and 03/29/2018) - improving. Follow CBC  DM   - SSI  Anxiety/Depression wellbutrin, cymbalta, gabapentin, xanax, vicodin, robaxin, xanax being held. She is awake and tolerating well.  - Will add back cymbalta and xanax to facilitate weaning.  - Slowly add back home meds to limit withdrawal and delirium    Best practice:  Diet: NPO TG held for nausea.  Pain/Anxiety/Delirium protocol (if indicated): fent gtt/PRN, delirium precautions VAP protocol (if indicated): ordered DVT prophylaxis: SCDs GI prophylaxis: PPI Glucose control: SSI Mobility: as  tolerated  Code Status: FULL Family Communication: No family bedside 3/2 Disposition: ICU  CC time 40 mins  Georgann Housekeeper, AGACNP-BC [place] Pager [phone] or [phone]  04/03/2018 12:38 PM  Attending Note:  73 year old female with a-fib with RVR.  Patient presented with acute pulmonary edema due to heart failure and was intubated.  On exam, coarse BS diffusely.  I reviewed CXR myself, PNA noted and ETT is in a good position.  Discussed with PCCM-NP.  Will continue abx for now.  CT of the chest pending, if large infiltrate consistent with amiodarone lung toxicity then will consider d/c amiodarone and starting steroids.  Hold off weaning for today.  Continue  levo and vaso for BP support.  Once back from CT will consider weaning to extubate.  PCCM will continue to manage.  The patient is critically ill with multiple organ systems failure and requires high complexity decision making for assessment and support, frequent evaluation and titration of therapies, application of advanced monitoring technologies and extensive interpretation of multiple databases.   Critical Care Time devoted to patient care services described in this note is  35  Minutes. This time reflects time of care of this signee Dr Jennet Maduro. This critical care time does not reflect procedure time, or teaching time or supervisory time of PA/NP/Med student/Med Resident etc but could involve care discussion time.  Rush Farmer, M.D. [facility] Pulmonary/Critical Care Medicine. Pager: [phone]. After hours pager: [phone].

## 2018-04-03 NOTE — Progress Notes (Addendum)
Patient ID: [name] [name], female   DOB: [birth year], 73 y.o.   [name]: [id number]    Advanced Heart Failure Rounding Note   Subjective:   Remains intubated. Follows commands.   Swan placed 3/1. Suspected septic shock. Placed on vasopressin and remains on norepi 13 mcg. ? CO-OX 90%   Swan #s RA 6  PA 37/17 PCW 17 CO/CI 8.3/3.7   Initial swan numbers 3/1 RA = 3 PA = 42/18 (26) PCW = 10 Fick cardiac output/index = 7.4/3.4 PVR = 2.1 WU SVR = 807   Echo EF 25-30% with global HK worse from mid-ventricle to apex. RV moderately down. (Echo 1/19 EF 55-60%)  Objective:   Weight Range:  Vital Signs:   Temp:  [97.7 F (36.5 C)-99.9 F (37.7 C)] 99.5 F (37.5 C) (03/02 0700) Pulse Rate:  [51-71] 70 (03/02 0700) Resp:  [12-26] 13 (03/02 0700) BP: (92-183)/(46-100) 169/54 (03/02 0700) SpO2:  [93 %-100 %] 100 % (03/02 0700) Arterial Line BP: (68-178)/(24-81) 150/52 (03/02 0700) FiO2 (%):  [40 %] 40 % (03/02 0400) Last BM Date: 03/25/18  Weight change: Filed Weights   03/31/18 0319 04/01/18 0500 04/02/18 0500  Weight: 123.8 kg 125.3 kg 126.6 kg    Intake/Output:   Intake/Output Summary (Last 24 hours) at 04/03/2018 0730 Last data filed at 04/03/2018 0600 Gross per 24 hour  Intake 3460.82 ml  Output 3000 ml  Net 460.82 ml     Physical Exam: General:  Intubated  HEENT: ETT Neck: supple. no JVD. Carotids 2+ bilat; no bruits. No lymphadenopathy or thryomegaly appreciated. RIJ swan  Cor: PMI nondisplaced. Regular rate & rhythm. No rubs, gallops or murmurs. Lungs: coarse throughout Abdomen: soft, nontender, nondistended. No hepatosplenomegaly. No bruits or masses. Good bowel sounds. Extremities: no cyanosis, clubbing, rash, edema Neuro: Intubated follows commands.   Telemetry: SR 60s    Labs: Basic Metabolic Panel: Recent Labs  Lab 03/28/18 0512  03/29/18 0518  04/01/18 0450 04/01/18 1539 04/01/18 1648 04/01/18 1810 04/02/18 0324 04/03/18 0444  NA 141   < > 139    < > 134* 136 133* 135 139 144  K 3.1*   < > 2.9*   < > 3.3* 3.5 5.2* 3.5 3.1* 3.9  CL 88*   < > 80*   < > 82* 83*  --  85* 91* 101  CO2 42*   < > 45*   < > 42* 38*  --  41* 37* 30  GLUCOSE 138*   < > 283*   < > 209* 275*  --  280* 115* 182*  BUN 30*   < > 38*   < > 93* 90*  --  92* 87* 48*  CREATININE 2.11*   < > 1.79*   < > 3.17* 2.94*  --  2.88* 2.53* 1.65*  CALCIUM 8.3*   < > 8.4*   < > 8.7* 8.9  --  8.9 8.8* 8.5*  MG 2.1  --  2.1  --  2.3 2.3  --   --  2.3 2.0  PHOS 2.1*  --  4.0  --   --  5.2*  --   --  2.1* 2.4*   < > = values in this interval not displayed.    Liver Function Tests: No results for input(s): AST, ALT, ALKPHOS, BILITOT, PROT, ALBUMIN in the last 168 hours. No results for input(s): LIPASE, AMYLASE in the last 168 hours. No results for input(s): AMMONIA in the last 168 hours.  CBC: Recent  Labs  Lab 03/31/18 0320 04/01/18 0450 04/01/18 1539 04/01/18 1648 04/02/18 0324 04/03/18 0444  WBC 11.1* 12.2* 14.9*  --  18.1* 15.9*  NEUTROABS  --   --  12.3*  --   --  11.8*  HGB 12.5 12.5 13.5 13.6 12.6 11.7*  HCT 41.8 40.1 44.1 40.0 38.7 36.7  MCV 99.5 97.1 96.3  --  94.9 97.9  PLT 81* 93* 117*  --  136* 137*    Cardiac Enzymes: Recent Labs  Lab 04/01/18 1539 04/02/18 0324 04/03/18 0444  TROPONINI 0.10* 0.13* 0.13*    BNP: BNP (last 3 results) Recent Labs    01/16/18 2150 03/25/18 1630  BNP 701.3* 1,480.1*    ProBNP (last 3 results) No results for input(s): PROBNP in the last 8760 hours.    Other results:  Imaging: US Renal  Result Date: 04/01/2018 CLINICAL DATA:  Acute kidney failure. EXAM: RENAL / URINARY TRACT ULTRASOUND COMPLETE COMPARISON:  None. FINDINGS: Right Kidney: Renal measurements: 10.6 x 4.9 x 6.1 cm = volume: 165 mL . Echogenicity within normal limits. No mass or hydronephrosis visualized. Left Kidney: Renal measurements: 8.6 x 4.5 x 4.0 cm = volume: 79 mL. Normal parenchymal echogenicity. Diffuse cortical thinning. No mass,  stone or hydronephrosis. Bladder: Appears normal for degree of bladder distention. IMPRESSION: 1. No acute finding.  No hydronephrosis. 2. Left renal atrophy/cortical thinning with a reduced renal size. No other abnormalities. Electronically Signed   By: Lajean Manes M.D.   On: 04/01/2018 12:25   Dg Chest Port 1 View  Result Date: 04/02/2018 CLINICAL DATA:  Central line placement EXAM: PORTABLE CHEST 1 VIEW COMPARISON:  04/02/2018 and prior radiographs FINDINGS: An endotracheal tube with tip 4.5 cm above the carina, NG tube entering the stomach with tip off the field of view and RIGHT PICC line with tip overlying the LOWER SVC again noted. A RIGHT IJ Swan-Ganz catheter has been placed with tip overlying the RIGHT interlobar pulmonary artery. Cardiomegaly, CABG changes and interstitial pulmonary edema again noted. LEFT basilar atelectasis with probable small LEFT pleural effusion again noted. No pneumothorax. IMPRESSION: RIGHT IJ Swan-Ganz catheter placement.  No pneumothorax. No other significant change. Electronically Signed   By: Margarette Canada M.D.   On: 04/02/2018 17:25   Dg Chest Port 1 View  Result Date: 04/02/2018 CLINICAL DATA:  Respiratory failure EXAM: PORTABLE CHEST 1 VIEW COMPARISON:  04/01/2018 FINDINGS: Cardiac shadow remains enlarged. Postsurgical changes are again seen. Endotracheal tube, gastric catheter and right-sided PICC line are again noted and stable. Persistent pulmonary vascular congestion with interval development of a interstitial edema is seen. No focal confluent infiltrate is noted. IMPRESSION: Postsurgical changes with tubes and lines as described above. Persistent pulmonary vascular congestion is noted with new interstitial edema. Electronically Signed   By: Inez Catalina M.D.   On: 04/02/2018 08:24   Dg Chest Port 1 View  Addendum Date: 04/01/2018   ADDENDUM REPORT: 04/01/2018 19:23 ADDENDUM: As described within the body of the report, the enteric tube (OG tube) is identified  with tip well below the level of the GE junction. The side port of the enteric tube projects over the expected location of the gastric fundus. Electronically Signed   By: Kerby Moors M.D.   On: 04/01/2018 19:23   Addendum Date: 04/01/2018   ADDENDUM REPORT: 04/01/2018 16:50 ADDENDUM: These results were called by telephone at the time of interpretation on 04/01/2018 at 4:50 pm to RN Cherlyn Cushing , who verbally acknowledged these results. Electronically Signed  By: Kerby Moors M.D.   On: 04/01/2018 16:50   Result Date: 04/01/2018 CLINICAL DATA:  Status post intubation and OG tube placement. EXAM: PORTABLE CHEST 1 VIEW COMPARISON:  03/29/2018 FINDINGS: The ET tube tip appears to curve towards the right mainstem bronchus. However, the distal tip appears to terminate within the proximal right mainstem bronchus. Right arm PICC line tip projects over the cavoatrial junction. There is an enteric tube with tip below the level of the GE junction. Heart size is enlarged. Pulmonary vascular congestion is improved from previous exam. IMPRESSION: 1. Interval intubation. The tip of the ET tube appears to be within the proximal right mainstem bronchus and may need to be retracted by approximately 2 cm. 2. Cardiac enlargement and pulmonary vascular congestion.  Improved. 3. These results will be called to the ordering clinician or representative by the Radiologist Assistant, and communication documented in the PACS or zVision Dashboard. Electronically Signed: By: Kerby Moors M.D. On: 04/01/2018 15:59     Medications:     Scheduled Medications: . amiodarone  100 mg Oral Daily  . budesonide (PULMICORT) nebulizer solution  0.25 mg Nebulization BID  . chlorhexidine gluconate (MEDLINE KIT)  15 mL Mouth Rinse BID  . Chlorhexidine Gluconate Cloth  6 each Topical Daily  . clopidogrel  75 mg Oral Daily  . ezetimibe  10 mg Oral QHS  . insulin aspart  1-3 Units Subcutaneous Q4H  . ipratropium-albuterol  3 mL Nebulization  Q6H  . mouth rinse  15 mL Mouth Rinse 10 times per day  . multivitamin  1 tablet Oral Daily  . pantoprazole sodium  40 mg Per Tube Q1200  . rosuvastatin  40 mg Oral QHS  . sodium chloride flush  10-40 mL Intracatheter Q12H  . vitamin B-12  1,000 mcg Oral Daily    Infusions: . sodium chloride 10 mL/hr at 04/01/18 1725  . ceFEPime (MAXIPIME) IV Stopped (04/02/18 1540)  . fentaNYL infusion INTRAVENOUS 300 mcg/hr (04/03/18 0600)  . norepinephrine (LEVOPHED) Adult infusion 13 mcg/min (04/03/18 0600)  . vasopressin (PITRESSIN) infusion - *FOR SHOCK* 0.03 Units/min (04/03/18 0600)    PRN Medications: sodium chloride, bisacodyl, docusate, fentaNYL, midazolam, nitroGLYCERIN, sodium chloride flush   Assessment:   [name] [name] is a 73 y.o. female with multiple medical problems including a hx of CAD s/p CABG and multiple PCI, HFpEF s/p CardioMEMS, AF, cerebrovascular disease, CKD, DM2, PAD, admitted 2/22 with acute respiratory failure requiring mechanical intubation in the setting of URI with fever to 102 and cough.   Plan/Discussion:    1. Acute hypoxemic respiratory failure: Suspect combination of pulmonary edema and likely PNA.  Initially intubated, now extubated.  Reintubated on 2/29. CXR with diffuse bilateral airspace disease and PCT 3.08=>2.39 => 0.26.=>0.13  - Check ESR & auto-immune panels.  ? Hypersensitivity pneumonitis - Sed rate 113  - May need chest CT to further evaluate for component or ARDS 2. Shock - Concern ARDS and vasodilatory shock - Vasopressin added 3/1 after RHC. Cardiac output ok. Septic shock suspected.  3. ID: Suspect PNA.  Initial fever to 102.6, now afebrile. PCT down to 0.26. WBC 18.1>15.9  - Was on levofloxacin.  Now on cefapime per CCM - Bcx and sputum cx from 2/29 NGTD 4. Acute systolic CHF: Echo with EF 25-30%, severe hypokinesis-akinesis of the mid-apical LV myocardium. Prior echo with normal EF.  ?Takotsubo event with stress of acute illness.  However,  she has known extensive coronary disease as well.  - CO-OX high.  Will repeat. Continue to wean norepi.  - CVP 6. No diuretics.  Creatinine coming down 2.5>1.65 - no b-blocker, ARB/ARNI or spiro with shock/AKI 5. AKI on CKD: stage 3, baseline 1.4-1.8.  - Creatinine trending down 2.5>1.65  5. CAD: Last cath 9/19 with severe native CAD and LIMA patent. Sequential SVG-PLOM/OM1 with 80% proximal stenosis, s/p DES. SVG to RCA chronically occluded. No CP.  Mild elevation in troponin with no trend, likely demand ischemia. Echo with newly low EF, may be Takotsubo.  - Continue statin.  - Plavix restarted 2/26 with plts rising.  - Cath considered with fall in EF. But with renal failure will defer.  6. Atrial fibrillation: Paroxysmal.  - Remains in NSR. Off anticoagulation with fall in platelets.  - PLTs 137 today.  If continue to stabilize can consider starting heparin soon.  7. Thrombocytopenia: ?ITP triggered by acute infection.  Doubt TTP, no schistocytes/evidence for hemolysis.  HIT negative.  Got 2 units plts 2/25.  Given a dose of Solumedrol and 2 doses IVIG.  -  Plts 137 K today.  - Hematology has seen 8. Hypokalemia - K stable today.   Darrick Grinder, NP-C  04/03/2018 7:30 AM  Patient seen with NP, agree with the above note.   She is awake on vent this morning.  She remains on vasopressin, NE now down to 9.  SBP 120s-130s.  CVP remains low at 6 on Swan with good cardiac output. CXR with left peri-hilar atelectasis.   On exam, awake on vent.  No JVD.  No edema.  Regular S1S2.  Coarse BS.   She is weaning on vent and pressors this morning. Suspect septic shock with PNA and possible ARDS-type picture based on low SVR and low filling pressures along with bilateral lung infiltrates and high PCT.  So far, no culture data.  Think amiodarone toxicity is less likely given initial high fever and PCT.  - Continue cefepime per pulmonary.  - ESR was 113, autoimmune serologies have been sent.  Could this  have been hypersensitivity pneumonitis or amiodarone toxicity (as above, think amio toxicity is probably less likely than infectious).  Now that she is stabilizing, would like to get CT chest w/o contrast if pulmonary is ok with this.   - Continue to wean NE, now at 9.  Vasopressin continues at 0.03. - Weaning vent, suspect can extubate soon.   Creatinine trending down, now at 1.65.  No diuretic with low CVP.   Plts up to 137.  Continue Plavix.  Will start prophylactic heparin [place] and restart Eliquis when she is extubated and taking po. She remains in NSR.   CRITICAL CARE Performed by: Loralie Champagne  Total critical care time: 40 minutes  Critical care time was exclusive of separately billable procedures and treating other patients.  Critical care was necessary to treat or prevent imminent or life-threatening deterioration.  Critical care was time spent personally by me on the following activities: development of treatment plan with patient and/or surrogate as well as nursing, discussions with consultants, evaluation of patient's response to treatment, examination of patient, obtaining history from patient or surrogate, ordering and performing treatments and interventions, ordering and review of laboratory studies, ordering and review of radiographic studies, pulse oximetry and re-evaluation of patient's condition.   Loralie Champagne 04/03/2018 9:48 AM

## 2018-04-03 NOTE — Progress Notes (Signed)
200 ml of Fentanyl wasted at this time with Ellard Artis, RN

## 2018-04-04 DIAGNOSIS — E1165 Type 2 diabetes mellitus with hyperglycemia: Secondary | ICD-10-CM

## 2018-04-04 DIAGNOSIS — N189 Chronic kidney disease, unspecified: Secondary | ICD-10-CM

## 2018-04-04 DIAGNOSIS — I4891 Unspecified atrial fibrillation: Secondary | ICD-10-CM

## 2018-04-04 DIAGNOSIS — E1122 Type 2 diabetes mellitus with diabetic chronic kidney disease: Secondary | ICD-10-CM

## 2018-04-04 DIAGNOSIS — R410 Disorientation, unspecified: Secondary | ICD-10-CM

## 2018-04-04 DIAGNOSIS — I5022 Chronic systolic (congestive) heart failure: Secondary | ICD-10-CM

## 2018-04-04 DIAGNOSIS — Z7901 Long term (current) use of anticoagulants: Secondary | ICD-10-CM

## 2018-04-04 DIAGNOSIS — J45909 Unspecified asthma, uncomplicated: Secondary | ICD-10-CM

## 2018-04-04 DIAGNOSIS — Z9981 Dependence on supplemental oxygen: Secondary | ICD-10-CM

## 2018-04-04 DIAGNOSIS — I130 Hypertensive heart and chronic kidney disease with heart failure and stage 1 through stage 4 chronic kidney disease, or unspecified chronic kidney disease: Principal | ICD-10-CM

## 2018-04-04 LAB — ENA+DNA/DS+ANTICH+CENTRO+JO...
Anti JO-1: <0.2 AI (ref 0.0–0.9)
Centromere Ab Screen: <0.2 AI (ref 0.0–0.9)
Chromatin Ab SerPl-aCnc: 0.2 AI (ref 0.0–0.9)
ENA SM Ab Ser-aCnc: <0.2 AI (ref 0.0–0.9)
Ribonucleic Protein: 0.5 AI (ref 0.0–0.9)
SSA (Ro) (ENA) Antibody, IgG: 1 AI — ABNORMAL HIGH (ref 0.0–0.9)
SSB (La) (ENA) Antibody, IgG: <0.2 AI (ref 0.0–0.9)
Scleroderma (Scl-70) (ENA) Antibody, IgG: <0.2 AI (ref 0.0–0.9)
ds DNA Ab: 5 IU/mL (ref 0–9)

## 2018-04-04 LAB — ANCA TITERS
Atypical P-ANCA titer: <1:20 {titer}
C-ANCA: <1:20 {titer}
P-ANCA: <1:20 {titer}

## 2018-04-04 LAB — CBC WITH DIFFERENTIAL/PLATELET
Abs Immature Granulocytes: 0.26 10*3/uL — ABNORMAL HIGH (ref 0.00–0.07)
Basophils Absolute: 0.1 10*3/uL (ref 0.0–0.1)
Basophils Relative: 0 %
Eosinophils Absolute: 0.2 10*3/uL (ref 0.0–0.5)
Eosinophils Relative: 1 %
HCT: 36.7 % (ref 36.0–46.0)
Hemoglobin: 11 g/dL — ABNORMAL LOW (ref 12.0–15.0)
Immature Granulocytes: 2 %
Lymphocytes Relative: 10 %
Lymphs Abs: 1.4 10*3/uL (ref 0.7–4.0)
MCH: 29.8 pg (ref 26.0–34.0)
MCHC: 30 g/dL (ref 30.0–36.0)
MCV: 99.5 fL (ref 80.0–100.0)
Monocytes Absolute: 1.3 10*3/uL — ABNORMAL HIGH (ref 0.1–1.0)
Monocytes Relative: 10 %
Neutro Abs: 10.5 10*3/uL — ABNORMAL HIGH (ref 1.7–7.7)
Neutrophils Relative %: 77 %
Platelets: 139 10*3/uL — ABNORMAL LOW (ref 150–400)
RBC: 3.69 MIL/uL — ABNORMAL LOW (ref 3.87–5.11)
RDW: 13.5 % (ref 11.5–15.5)
WBC: 13.8 10*3/uL — ABNORMAL HIGH (ref 4.0–10.5)
nRBC: 0 % (ref 0.0–0.2)

## 2018-04-04 LAB — GLUCOSE, CAPILLARY
Glucose-Capillary: 131 mg/dL — ABNORMAL HIGH (ref 70–99)
Glucose-Capillary: 153 mg/dL — ABNORMAL HIGH (ref 70–99)
Glucose-Capillary: 170 mg/dL — ABNORMAL HIGH (ref 70–99)
Glucose-Capillary: 185 mg/dL — ABNORMAL HIGH (ref 70–99)
Glucose-Capillary: 250 mg/dL — ABNORMAL HIGH (ref 70–99)
Glucose-Capillary: 253 mg/dL — ABNORMAL HIGH (ref 70–99)

## 2018-04-04 LAB — ANA W/REFLEX IF POSITIVE: Anti Nuclear Antibody(ANA): POSITIVE — AB

## 2018-04-04 LAB — CYCLIC CITRUL PEPTIDE ANTIBODY, IGG/IGA: CCP Antibodies IgG/IgA: 12 units (ref 0–19)

## 2018-04-04 LAB — BASIC METABOLIC PANEL
Anion gap: 10 (ref 5–15)
BUN: 31 mg/dL — ABNORMAL HIGH (ref 8–23)
CO2: 33 mmol/L — ABNORMAL HIGH (ref 22–32)
Calcium: 8.9 mg/dL (ref 8.9–10.3)
Chloride: 104 mmol/L (ref 98–111)
Creatinine, Ser: 1.27 mg/dL — ABNORMAL HIGH (ref 0.44–1.00)
GFR calc Af Amer: 50 mL/min — ABNORMAL LOW (ref >60)
GFR calc non Af Amer: 43 mL/min — ABNORMAL LOW (ref >60)
Glucose, Bld: 143 mg/dL — ABNORMAL HIGH (ref 70–99)
Potassium: 3.2 mmol/L — ABNORMAL LOW (ref 3.5–5.1)
Sodium: 147 mmol/L — ABNORMAL HIGH (ref 135–145)

## 2018-04-04 LAB — MAGNESIUM: Magnesium: 2.1 mg/dL (ref 1.7–2.4)

## 2018-04-04 LAB — COOXEMETRY PANEL
Carboxyhemoglobin: 1.6 % — ABNORMAL HIGH (ref 0.5–1.5)
Methemoglobin: 1.2 % (ref 0.0–1.5)
O2 Saturation: 77.6 %
Total hemoglobin: 13.4 g/dL (ref 12.0–16.0)

## 2018-04-04 LAB — PHOSPHORUS: Phosphorus: 1.8 mg/dL — ABNORMAL LOW (ref 2.5–4.6)

## 2018-04-04 LAB — RHEUMATOID FACTOR: Rheumatoid fact SerPl-aCnc: 10.9 IU/mL (ref 0.0–13.9)

## 2018-04-04 MED ORDER — PANTOPRAZOLE SODIUM 40 MG PO TBEC
40.0000 mg | DELAYED_RELEASE_TABLET | Freq: Every day | ORAL | Status: DC
  Administered 2018-04-04 – 2018-04-10 (×7): 40 mg via ORAL
  Filled 2018-04-04 (×8): qty 1

## 2018-04-04 MED ORDER — SPIRONOLACTONE 12.5 MG HALF TABLET
12.5000 mg | ORAL_TABLET | Freq: Every day | ORAL | Status: DC
  Administered 2018-04-04: 12.5 mg via ORAL
  Filled 2018-04-04: qty 1

## 2018-04-04 MED ORDER — POTASSIUM CHLORIDE 10 MEQ/50ML IV SOLN
10.0000 meq | INTRAVENOUS | Status: AC
  Administered 2018-04-04 (×4): 10 meq via INTRAVENOUS
  Filled 2018-04-04 (×2): qty 50

## 2018-04-04 MED ORDER — METOPROLOL SUCCINATE ER 25 MG PO TB24
25.0000 mg | ORAL_TABLET | Freq: Two times a day (BID) | ORAL | Status: DC
  Administered 2018-04-04 (×2): 25 mg via ORAL
  Filled 2018-04-04 (×2): qty 1

## 2018-04-04 MED ORDER — IPRATROPIUM-ALBUTEROL 0.5-2.5 (3) MG/3ML IN SOLN
3.0000 mL | Freq: Three times a day (TID) | RESPIRATORY_TRACT | Status: DC
  Administered 2018-04-05 – 2018-04-07 (×7): 3 mL via RESPIRATORY_TRACT
  Filled 2018-04-04 (×7): qty 3

## 2018-04-04 MED ORDER — METHYLPREDNISOLONE SODIUM SUCC 40 MG IJ SOLR
40.0000 mg | Freq: Four times a day (QID) | INTRAMUSCULAR | Status: DC
  Administered 2018-04-04 – 2018-04-06 (×8): 40 mg via INTRAVENOUS
  Filled 2018-04-04 (×8): qty 1

## 2018-04-04 MED ORDER — ONDANSETRON HCL 4 MG/2ML IJ SOLN: 4.0000 mg | Freq: Three times a day (TID) | INTRAMUSCULAR | Status: DC | PRN

## 2018-04-04 MED ORDER — ONDANSETRON HCL 4 MG/2ML IJ SOLN
4.0000 mg | Freq: Once | INTRAMUSCULAR | Status: AC
  Administered 2018-04-04: 4 mg via INTRAVENOUS
  Filled 2018-04-04: qty 2

## 2018-04-04 MED ORDER — HYDRALAZINE HCL 20 MG/ML IJ SOLN
5.0000 mg | INTRAMUSCULAR | Status: DC | PRN
  Administered 2018-04-05 (×2): 5 mg via INTRAVENOUS
  Filled 2018-04-04 (×2): qty 1

## 2018-04-04 MED ORDER — APIXABAN 5 MG PO TABS
5.0000 mg | ORAL_TABLET | Freq: Two times a day (BID) | ORAL | Status: DC
  Administered 2018-04-04 – 2018-04-10 (×13): 5 mg via ORAL
  Filled 2018-04-04 (×13): qty 1

## 2018-04-04 MED ORDER — INSULIN ASPART 100 UNIT/ML ~~LOC~~ SOLN
0.0000 [IU] | SUBCUTANEOUS | Status: DC
  Administered 2018-04-05: 8 [IU] via SUBCUTANEOUS
  Administered 2018-04-05: 5 [IU] via SUBCUTANEOUS
  Administered 2018-04-05: 8 [IU] via SUBCUTANEOUS

## 2018-04-04 MED ORDER — METOPROLOL TARTRATE 25 MG PO TABS: 25.0000 mg | ORAL_TABLET | Freq: Two times a day (BID) | ORAL | Status: DC

## 2018-04-04 MED ORDER — POTASSIUM CHLORIDE CRYS ER 20 MEQ PO TBCR
40.0000 meq | EXTENDED_RELEASE_TABLET | Freq: Once | ORAL | Status: AC
  Administered 2018-04-04: 40 meq via ORAL
  Filled 2018-04-04: qty 2

## 2018-04-04 MED ORDER — DOCUSATE SODIUM 100 MG PO CAPS
100.0000 mg | ORAL_CAPSULE | Freq: Every day | ORAL | Status: DC
  Administered 2018-04-04 – 2018-04-07 (×4): 100 mg via ORAL
  Filled 2018-04-04 (×5): qty 1

## 2018-04-04 NOTE — Evaluation (Signed)
Clinical/Bedside Swallow Evaluation Patient Details  Name: [name] [name] [name]: [id number] Date of Birth: Aug 09, 1945  Today's Date: 04/04/2018 Time: SLP Start Time (ACUTE ONLY): 1048 SLP Stop Time (ACUTE ONLY): 1104 SLP Time Calculation (min) (ACUTE ONLY): 16 min  Past Medical History:  Past Medical History:  Diagnosis Date  . Abnormality of gait 05/14/2014  . Anemia    hx  . Anginal pain ([place])   . Anxiety   . Asthma   . Basal cell carcinoma 05/2014   "left shoulder"  . Bundle branch block, left    chronic/notes 07/18/2013  . CHF (congestive heart failure) ([place])   . Chronic bronchitis ([place])    "off and on all the time" (07/18/2013)  . Chronic insomnia 05/06/2015  . Chronic kidney disease    frequency, sees dr Jamal Maes every 4 to 6 months (01/16/2018)  . Chronic low back pain 08/24/2016  . Chronic lower back pain   . Claustrophobia   . Common migraine 05/14/2014  . Coronary artery disease   . Depression   . Dysrhythmia   . GERD (gastroesophageal reflux disease)   . H/O hiatal hernia   . Headache    "at least 2/month" (01/16/2018)  . Heart murmur   . Hyperlipidemia   . Hypertension   . Irregular heart beat   . Leg cramps    both legs at times  . Melanoma ([place]) 05/2014   "burned off BLE" (01/16/2018)  . Memory change 05/14/2014  . Migraine    "5-6/year"  (01/16/2018)  . Myocardial infarction ([place]) 04/1999, 02/2000, 01/2005; 2011; 2014  . Neuromuscular disorder ([place])    ?  . Obesity 01-2010  . Obstructive sleep apnea    "can't wear machine; I have claustrophobia" (01/16/2018)  . On home oxygen therapy    "2L at night and prn during daytime" (01/16/2018)  . Osteoarthritis    "knees and hands" (01/16/2018)  . Other and unspecified angina pectoris   . Peripheral vascular disease ([facility])    ? numbness, tingling arms and legs  . Pneumonia 2000's   "once"  . PONV (postoperative nausea and vomiting)   . Shortness of breath    with exertion  . Stroke ([place]) 03/22/12   right side brain; denies residual on 07/18/2013)  . Stroke [facility]) Oct. 2015   Affected pt.s balance  . Stroke [facility]) 10/2014   "affected my legs; fully recovered now"; still have sporatic memory issues from this one" (01/16/2018)  . Type II diabetes mellitus ([place])   . Ventral hernia    hx of  . Vomiting    persistent  . Vomiting blood    Past Surgical History:  Past Surgical History:  Procedure Laterality Date  . ANKLE FRACTURE SURGERY Left 1970's  . APPENDECTOMY  1970's   w/hysterectomy  . BASAL CELL CARCINOMA EXCISION Left 05/2014   "shoulder" (01/16/2018)  . CARDIAC CATHETERIZATION  10/10/2012   Dr [name].  Marland Kitchen CARDIAC CATHETERIZATION N/A 05/29/2015   Procedure: Right/Left Heart Cath and Coronary/Graft Angiography;  Surgeon: Larey Dresser, MD;  Location: [place] CV LAB;  Service: Cardiovascular;  Laterality: N/A;  . CAROTID ENDARTERECTOMY Left 03/2013  . CAROTID STENT INSERTION Left 03/20/2013   Procedure: CAROTID STENT INSERTION;  Surgeon: [name], MD;  Location: [facility] CATH LAB;  Service: Cardiovascular;  Laterality: Left;  internal carotid  . CEREBRAL ANGIOGRAM N/A 04/05/2011   Procedure: CEREBRAL ANGIOGRAM;  Surgeon: Angelia Mould, MD;  Location: [facility] CATH LAB;  Service: Cardiovascular;  Laterality:  N/A;  . CHOLECYSTECTOMY OPEN  2004  . CORONARY ANGIOPLASTY WITH STENT PLACEMENT  01,02,05,06,07,08,11; 04/24/2013   "I've probably got ~ 10 stents by now" (04/24/2013)  . CORONARY ANGIOPLASTY WITH STENT PLACEMENT  06/13/2013   "got 4 stents today" (06/13/2013)  . CORONARY ARTERY BYPASS GRAFT  1220/11   "CABG X5"  . CORONARY STENT INTERVENTION N/A 10/20/2017   Procedure: CORONARY STENT INTERVENTION;  Surgeon: Troy Sine, MD;  Location: [place] CV LAB;  Service: Cardiovascular;  Laterality: N/A;  . ESOPHAGOGASTRODUODENOSCOPY  08/03/2011   Procedure: ESOPHAGOGASTRODUODENOSCOPY (EGD);  Surgeon: Shann Medal, MD;  Location: [facility];  Service: General;  Laterality:  N/A;  . ESOPHAGOGASTRODUODENOSCOPY (EGD) WITH PROPOFOL N/A 03/11/2014   Procedure: ESOPHAGOGASTRODUODENOSCOPY (EGD) WITH PROPOFOL;  Surgeon: Lafayette Dragon, MD;  Location: [facility] ENDOSCOPY;  Service: Endoscopy;  Laterality: N/A;  . FRACTURE SURGERY    . gall stone removal  05/2003  . GASTRIC RESTRICTION SURGERY  1984   "stapeling"  . HERNIA REPAIR  2004   "in my stomach; had OR on it twice", wire mesh on 1 hernia  . LEFT HEART CATH AND CORS/GRAFTS ANGIOGRAPHY N/A 07/09/2016   Procedure: LEFT HEART CATH AND CORS/GRAFTS ANGIOGRAPHY;  Surgeon: Larey Dresser, MD;  Location: [place] CV LAB;  Service: Cardiovascular;  Laterality: N/A;  . OVARY SURGERY  1970's   "tumor removed"  . PERCUTANEOUS CORONARY STENT INTERVENTION (PCI-S) N/A 06/13/2013   Procedure: PERCUTANEOUS CORONARY STENT INTERVENTION (PCI-S);  Surgeon: [name], MD;  Location: [facility] CATH LAB;  Service: Cardiovascular;  Laterality: N/A;  . PERCUTANEOUS STENT INTERVENTION N/A 04/24/2013   Procedure: PERCUTANEOUS STENT INTERVENTION;  Surgeon: [name], MD;  Location: [facility] CATH LAB;  Service: Cardiovascular;  Laterality: N/A;  . RIGHT/LEFT HEART CATH AND CORONARY/GRAFT ANGIOGRAPHY N/A 10/20/2017   Procedure: RIGHT/LEFT HEART CATH AND CORONARY/GRAFT ANGIOGRAPHY;  Surgeon: Larey Dresser, MD;  Location: [place] CV LAB;  Service: Cardiovascular;  Laterality: N/A;  . ROOT CANAL  10/2000  . TIBIA FRACTURE SURGERY Right 1970's   rods and pins  . TOOTH EXTRACTION     "1 on the upper; wisdom tooth on the lower" (01/16/2018)  . TOTAL ABDOMINAL HYSTERECTOMY  49's   w/ appendectomy   HPI:  Pt is a 73 year old woman with history of asthma, OSA not on CPAP, chronic hypoxic respiratory failure on 2L at night, CAD s/p CABG and multiple PCI, HFpEF s/p CardioMEMS, AF on Eliquis, cerebrovascular disease, HTN, CKD, DM2, PAD presenting with cough. Found in ED to have fever, hypertensive urgency, and acute on chronic hypoxic respiratory  failure. Intubated 2/22-2/25, 2/29- 3/2.   Assessment / Plan / Recommendation Clinical Impression  Pt still presents with a functional appearing swallow despite reintubation, baseline risk factors, and altered mentation. Her voice is hoarse but unchaged from after initial intubation per husband report. She consumed three ounces of water without stopping or coughing, and she requested a liquid wash to clear mild oral residue with Mod I. Recommend regular solids and thin liquids (although per RN, may start with clear liquids due to nausea). Please reorder if any acute changes arise. SLP Visit Diagnosis: Dysphagia, unspecified (R13.10)    Aspiration Risk  Mild aspiration risk    Diet Recommendation Regular;Thin liquid   Liquid Administration via: Cup;Straw Medication Administration: Whole meds with liquid Supervision: Patient able to self feed;Intermittent supervision to cue for compensatory strategies Compensations: Slow rate;Small sips/bites Postural Changes: Seated upright at 90 degrees    Other  Recommendations  Oral Care Recommendations: Oral care BID   Follow up Recommendations None      Frequency and Duration            Prognosis Prognosis for Safe Diet Advancement: Good      Swallow Study   General HPI: Pt is a 73 year old woman with history of asthma, OSA not on CPAP, chronic hypoxic respiratory failure on 2L at night, CAD s/p CABG and multiple PCI, HFpEF s/p CardioMEMS, AF on Eliquis, cerebrovascular disease, HTN, CKD, DM2, PAD presenting with cough. Found in ED to have fever, hypertensive urgency, and acute on chronic hypoxic respiratory failure. Intubated 2/22-2/25, 2/29- 3/2. Type of Study: Bedside Swallow Evaluation Previous Swallow Assessment: BSE 2/26 (this admission) that was [facility] Diet Prior to this Study: NPO Temperature Spikes Noted: No Respiratory Status: Nasal cannula History of Recent Intubation: Yes Length of Intubations (days): 3 days(reintubation) Date  extubated: 04/03/18 Behavior/Cognition: Alert;Cooperative;Confused Oral Cavity Assessment: Within Functional Limits Oral Care Completed by SLP: No Oral Cavity - Dentition: Adequate natural dentition Vision: Functional for self-feeding Self-Feeding Abilities: Needs assist Patient Positioning: Upright in bed Baseline Vocal Quality: Hoarse Volitional Cough: Strong;Congested Volitional Swallow: Unable to elicit    Oral/Motor/Sensory Function Overall Oral Motor/Sensory Function: Within functional limits   Ice Chips Ice chips: Within functional limits Presentation: Spoon   Thin Liquid Thin Liquid: Within functional limits Presentation: Cup;Straw    Nectar Thick Nectar Thick Liquid: Not tested   Honey Thick Honey Thick Liquid: Not tested   Puree Puree: Within functional limits Presentation: Spoon   Solid     Solid: Within functional limits      Venita Sheffield Milinda Sweeney 04/04/2018,12:35 PM  Pollyann Glen, M.A. [place] Acute Environmental education officer [phone] Office [phone]
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
## 2018-04-04 NOTE — Progress Notes (Signed)
Called eLink and spoke w/Jodi .Marland Kitchen. notified of K = 3.2  Awaiting orders.

## 2018-04-04 NOTE — Progress Notes (Signed)
Pt tolerated well sips of water w/no cough or signs of aspiration.   Will continue to monitor pt closely.

## 2018-04-04 NOTE — Progress Notes (Signed)
Pt tolerated well PO meds and sips of water well w/no cough or signs of aspiration.   Will continue to monitor pt closely.
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
## 2018-04-04 NOTE — Progress Notes (Signed)
[facility] Progress Note Patient Name: [name] [name] DOB: March 23, 1945 [name]: [id number]   Date of Service  04/04/2018  HPI/Events of Note  Informed of Low K at 3.2 Crea improving at 1.27.  eICU Interventions  Replete K - 53meq ordered.     Intervention Category Intermediate Interventions: Electrolyte abnormality - evaluation and management  Elsie Lincoln 04/04/2018, 7:03 AM

## 2018-04-04 NOTE — Evaluation (Signed)
Physical Therapy Evaluation Patient Details Name: [name] [name] [name]: [id number] DOB: 1946-01-07 Today's Date: 04/04/2018   History of Present Illness  [name] is a 73 year old woman with history of asthma, OSA not on CPAP, chronic hypoxic respiratory failure on 2L at night, CAD s/p CABG and multiple PCI, HFpEF s/p CardioMEMS, AF on Eliquis, cerebrovascular disease, HTN, CKD, DM2, PAD presenting with cough found in the ED to have fever, hypertensive urgency, and acute on chronic hypoxic respiratory failure. intubated 2/22-2/25. Re-intubated 2/29 and extubated on 3/2.  Clinical Impression  PT reordered after pt re-intubated and now extubated. Pt continues to demonstrate weakness and decr mobility as wel as delirium. Continue to recommend SNF prior to return home.     Follow Up Recommendations SNF    Equipment Recommendations  None recommended by PT    Recommendations for Other Services       Precautions / Restrictions Precautions Precautions: Fall Restrictions Weight Bearing Restrictions: No      Mobility  Bed Mobility Overal bed mobility: Needs Assistance Bed Mobility: Supine to Sit;Sit to Supine     Supine to sit: Min assist Sit to supine: +2 for physical assistance;Min assist   General bed mobility comments: Assist to elevate trunk into sitting. Assist to lower trunk and bring feet back up into bed to return to supine.  Transfers Overall transfer level: Needs assistance Equipment used: 2 person hand held assist Transfers: Sit to/from Stand Sit to Stand: Min assist;+2 physical assistance         General transfer comment: Assist to bring hips up and for balance  Ambulation/Gait Ambulation/Gait assistance: +2 physical assistance;Min assist Gait Distance (Feet): 2 Feet(side-stepping) Assistive device: 2 person hand held assist Gait Pattern/deviations: Step-to pattern;Decreased step length - right;Decreased step length - left;Shuffle;Trunk flexed Gait velocity:  decr Gait velocity interpretation: <1.31 ft/sec, indicative of household ambulator General Gait Details: Assist for balance and support. Side-stepped up side of bed  Stairs            Wheelchair Mobility    Modified Rankin (Stroke Patients Only)       Balance Overall balance assessment: Needs assistance Sitting-balance support: Feet unsupported;No upper extremity supported Sitting balance-Leahy Scale: Fair Sitting balance - Comments: Sat EOB x 15 minutes with supervision for safety   Standing balance support: During functional activity;Bilateral upper extremity supported Standing balance-Leahy Scale: Poor Standing balance comment: UE support and +2 min assist for static standing                             Pertinent Vitals/Pain Pain Assessment: Faces Faces Pain Scale: No hurt    Home Living Family/patient expects to be discharged to:: Private residence Living Arrangements: Spouse/significant other Available Help at Discharge: Family;Available PRN/intermittently;Available 24 hours/day Type of Home: House Home Access: Level entry     Home Layout: Two level;Bed/bath upstairs Home Equipment: Walker - 4 wheels      Prior Function Level of Independence: Needs assistance   Gait / Transfers Assistance Needed: very limited tolerance to gait, 5 steps before rest at baseline.   ADL's / Homemaking Assistance Needed: I with ADls family reports 1 fall in tub recently.        Hand Dominance   Dominant Hand: Right    Extremity/Trunk Assessment   Upper Extremity Assessment Upper Extremity Assessment: Defer to OT evaluation    Lower Extremity Assessment Lower Extremity Assessment: Generalized weakness       Communication  Communication: No difficulties  Cognition Arousal/Alertness: Awake/alert Behavior During Therapy: Restless Overall Cognitive Status: Impaired/Different from baseline Area of Impairment: Awareness;Safety/judgement;Following  commands;Attention;Orientation;Memory                 Orientation Level: Disoriented to;Time;Place;Situation Current Attention Level: Sustained Memory: Decreased short-term memory Following Commands: Follows one step commands inconsistently Safety/Judgement: Decreased awareness of safety;Decreased awareness of deficits Awareness: Intellectual   General Comments: Pt talking about people in the mirror watching her.      General Comments      Exercises     Assessment/Plan    PT Assessment Patient needs continued PT services  PT Problem List Decreased strength;Decreased activity tolerance;Decreased balance;Decreased mobility;Decreased cognition;Obesity       PT Treatment Interventions DME instruction;Gait training;Stair training;Functional mobility training;Therapeutic exercise;Therapeutic activities;Balance training    PT Goals (Current goals can be found in the Care Plan section)  Acute Rehab PT Goals Patient Stated Goal: none stated PT Goal Formulation: With patient Time For Goal Achievement: 04/18/18 Potential to Achieve Goals: Fair    Frequency Min 3X/week   Barriers to discharge Inaccessible home environment stairs in home    Co-evaluation               AM-PAC PT "6 Clicks" Mobility  Outcome Measure Help needed turning from your back to your side while in a flat bed without using bedrails?: A Little Help needed moving from lying on your back to sitting on the side of a flat bed without using bedrails?: A Little Help needed moving to and from a bed to a chair (including a wheelchair)?: A Lot Help needed standing up from a chair using your arms (e.g., wheelchair or bedside chair)?: A Lot Help needed to walk in hospital room?: Total Help needed climbing 3-5 steps with a railing? : Total 6 Click Score: 12    End of Session Equipment Utilized During Treatment: Oxygen Activity Tolerance: Other (comment)(delirium) Patient left: in bed;with bed alarm  set;with call bell/phone within reach Nurse Communication: Mobility status PT Visit Diagnosis: Unsteadiness on feet (R26.81);Muscle weakness (generalized) (M62.81);Other abnormalities of gait and mobility (R26.89)    Time: 1443-1540 PT Time Calculation (min) (ACUTE ONLY): 24 min   Charges:   PT Evaluation $PT Eval Moderate Complexity: 1 Mod PT Treatments $Therapeutic Activity: 8-22 mins        [place] Pager [phone] Office [place] 04/04/2018, 2:19 PM

## 2018-04-04 NOTE — Progress Notes (Signed)
Removed Introducer at this time.  Manual pressure held.  RN will continue to monitor.

## 2018-04-04 NOTE — Progress Notes (Signed)
Wasted 150 cc of Fent w/Emily Archie Balboa, Therapist, sports

## 2018-04-04 NOTE — Progress Notes (Signed)
[name] [name]   DOB:February 07, 1945   DZ#:[id number]   TMH#:[id number]  Hematology follow up note   Subjective: The patient was extubated 04/03/2018. Off pressors. Currently on cefepime. T Max 99.7 in past 24 hours. Has some confusion, but remembers me and circumstances regarding her hospitalization. No bleeding. Remains on Plavix. Eliquis restarted today.   Objective:  Vitals:   04/04/18 1200 04/04/18 1300  BP: (!) 174/69 (!) 164/72  Pulse: 92 95  Resp: 19 19  Temp:    SpO2: 99% 95%    Body mass index is 47.1 kg/m.  Intake/Output Summary (Last 24 hours) at 04/04/2018 1348 Last data filed at 04/04/2018 1300 Gross per 24 hour  Intake 653.68 ml  Output 1410 ml  Net -756.32 ml     Sclerae unicteric  No peripheral adenopathy  Lungs clear -- rales to bases  Heart regular rate and rhythm  Abdomen benign  Neuro nonfocal  Multiple small ecchymosis on arms and legs  CBG (last 3)  Recent Labs    04/04/18 0417 04/04/18 0823 04/04/18 1105  GLUCAP 131* 153* 170*     Labs:  Lab Results  Component Value Date   WBC 13.8 (H) 04/04/2018   HGB 11.0 (L) 04/04/2018   HCT 36.7 04/04/2018   MCV 99.5 04/04/2018   PLT 139 (L) 04/04/2018   NEUTROABS 10.5 (H) 04/04/2018    CMP Latest Ref Rng & Units 04/04/2018 04/03/2018 04/02/2018  Glucose 70 - 99 mg/dL 143(H) 182(H) 115(H)  BUN 8 - 23 mg/dL 31(H) 48(H) 87(H)  Creatinine 0.44 - 1.00 mg/dL 1.27(H) 1.65(H) 2.53(H)  Sodium 135 - 145 mmol/L 147(H) 144 139  Potassium 3.5 - 5.1 mmol/L 3.2(L) 3.9 3.1(L)  Chloride 98 - 111 mmol/L 104 101 91(L)  CO2 22 - 32 mmol/L 33(H) 30 37(H)  Calcium 8.9 - 10.3 mg/dL 8.9 8.5(L) 8.8(L)  Total Protein 6.5 - 8.1 g/dL - - -  Total Bilirubin 0.3 - 1.2 mg/dL - - -  Alkaline Phos 38 - 126 U/L - - -  AST 15 - 41 U/L - - -  ALT 0 - 44 U/L - - -     Urine Studies No results for input(s): UHGB, CRYS in the last 72 hours.  Invalid input(s): UACOL, UAPR, USPG, UPH, UTP, UGL, UKET, UBIL, UNIT, UROB, ULEU, UEPI, UWBC, Junie Panning [place], [place], [place]  Basic Metabolic Panel: Recent Labs  Lab 03/29/18 0518  04/01/18 0450 04/01/18 1539 04/01/18 1648 04/01/18 1810 04/02/18 0324 04/03/18 0444 04/04/18 0413  NA 139   < > 134* 136 133* 135 139 144 147*  K 2.9*   < > 3.3* 3.5 5.2* 3.5 3.1* 3.9 3.2*  CL 80*   < > 82* 83*  --  85* 91* 101 104  CO2 45*   < > 42* 38*  --  41* 37* 30 33*  GLUCOSE 283*   < > 209* 275*  --  280* 115* 182* 143*  BUN 38*   < > 93* 90*  --  92* 87* 48* 31*  CREATININE 1.79*   < > 3.17* 2.94*  --  2.88* 2.53* 1.65* 1.27*  CALCIUM 8.4*   < > 8.7* 8.9  --  8.9 8.8* 8.5* 8.9  MG 2.1  --  2.3 2.3  --   --  2.3 2.0 2.1  PHOS 4.0  --   --  5.2*  --   --  2.1* 2.4* 1.8*   < > = values in this interval not displayed.  GFR Estimated Creatinine Clearance: 50 mL/min (A) (by C-G formula based on SCr of 1.27 mg/dL (H)). Liver Function Tests: No results for input(s): AST, ALT, ALKPHOS, BILITOT, PROT, ALBUMIN in the last 168 hours. No results for input(s): LIPASE, AMYLASE in the last 168 hours. No results for input(s): AMMONIA in the last 168 hours. Coagulation profile Recent Labs  Lab 04/03/18 0444  INR 1.3*    CBC: Recent Labs  Lab 04/01/18 0450 04/01/18 1539 04/01/18 1648 04/02/18 0324 04/03/18 0444 04/04/18 0413  WBC 12.2* 14.9*  --  18.1* 15.9* 13.8*  NEUTROABS  --  12.3*  --   --  11.8* 10.5*  HGB 12.5 13.5 13.6 12.6 11.7* 11.0*  HCT 40.1 44.1 40.0 38.7 36.7 36.7  MCV 97.1 96.3  --  94.9 97.9 99.5  PLT 93* 117*  --  136* 137* 139*   Cardiac Enzymes: Recent Labs  Lab 04/01/18 1539 04/02/18 0324 04/03/18 0444  TROPONINI 0.10* 0.13* 0.13*   BNP: Invalid input(s): POCBNP CBG: Recent Labs  Lab 04/03/18 1958 04/03/18 2346 04/04/18 0417 04/04/18 0823 04/04/18 1105  GLUCAP 153* 139* 131* 153* 170*   D-Dimer No results for input(s): DDIMER in the last 72 hours. Hgb A1c No results for input(s): HGBA1C in the last 72 hours. Lipid Profile No results for input(s):  CHOL, HDL, LDLCALC, TRIG, CHOLHDL, LDLDIRECT in the last 72 hours. Thyroid function studies No results for input(s): TSH, T4TOTAL, T3FREE, THYROIDAB in the last 72 hours.  Invalid input(s): FREET3 Anemia work up No results for input(s): VITAMINB12, FOLATE, FERRITIN, TIBC, IRON, RETICCTPCT in the last 72 hours. Microbiology Recent Results (from the past 240 hour(s))  Urine culture     Status: Abnormal   Collection Time: 03/25/18  4:18 PM  Result Value Ref Range Status   Specimen Description URINE, RANDOM  Final   Special Requests   Final    NONE Performed at [facility], [address] [place], [place] [address]    Culture 10,000 COLONIES/mL ESCHERICHIA COLI (A)  Final   Report Status 03/27/2018 FINAL  Final   Organism ID, Bacteria ESCHERICHIA COLI (A)  Final      Susceptibility   Escherichia coli - MIC*    AMPICILLIN <=2 SENSITIVE Sensitive     CEFAZOLIN <=4 SENSITIVE Sensitive     CEFTRIAXONE <=1 SENSITIVE Sensitive     CIPROFLOXACIN <=0.25 SENSITIVE Sensitive     GENTAMICIN <=1 SENSITIVE Sensitive     IMIPENEM <=0.25 SENSITIVE Sensitive     NITROFURANTOIN <=16 SENSITIVE Sensitive     TRIMETH/SULFA <=20 SENSITIVE Sensitive     AMPICILLIN/SULBACTAM <=2 SENSITIVE Sensitive     PIP/TAZO <=4 SENSITIVE Sensitive     Extended ESBL NEGATIVE Sensitive     * 10,000 COLONIES/mL ESCHERICHIA COLI  Culture, blood (routine x 2)     Status: None   Collection Time: 03/25/18  4:18 PM  Result Value Ref Range Status   Specimen Description BLOOD RIGHT ANTECUBITAL  Final   Special Requests   Final    BOTTLES DRAWN AEROBIC AND ANAEROBIC Blood Culture results may not be optimal due to an inadequate volume of blood received in culture bottles   Culture   Final    NO GROWTH 5 DAYS Performed at [facility], [place] [address] [place], [place] [address]    Report Status 03/30/2018 FINAL  Final  Culture, blood (routine x 2)     Status: None   Collection Time: 03/25/18  4:23 PM  Result  Value Ref Range Status   Specimen Description BLOOD LEFT ANTECUBITAL  Final   Special Requests   Final    AEROBIC BOTTLE ONLY Blood Culture results may not be optimal due to an inadequate volume of blood received in culture bottles   Culture   Final    NO GROWTH 5 DAYS Performed at [place] [address] [place], [place] [address]    Report Status 03/30/2018 FINAL  Final  MRSA PCR Screening     Status: None   Collection Time: 03/26/18 12:00 AM  Result Value Ref Range Status   MRSA by PCR NEGATIVE NEGATIVE Final    Comment:        The GeneXpert MRSA Assay (FDA approved for NASAL specimens only), is one component of a comprehensive MRSA colonization surveillance program. It is not intended to diagnose MRSA infection nor to guide or monitor treatment for MRSA infections. Performed at [facility], [place] [address], [place], [place] [address]   Culture, respiratory (non-expectorated)     Status: None   Collection Time: 03/27/18  8:46 AM  Result Value Ref Range Status   Specimen Description TRACHEAL ASPIRATE  Final   Special Requests NONE  Final   Gram Stain   Final    ABUNDANT WBC PRESENT, PREDOMINANTLY PMN NO ORGANISMS SEEN    Culture   Final    FEW Consistent with normal respiratory flora. Performed at [facility], [place] [address], [place], [place] [address]    Report Status 03/29/2018 FINAL  Final  Culture, blood (Routine X 2) w Reflex to ID Panel     Status: None (Preliminary result)   Collection Time: 04/01/18  4:30 PM  Result Value Ref Range Status   Specimen Description BLOOD SITE NOT SPECIFIED  Final   Special Requests   Final    BOTTLES DRAWN AEROBIC AND ANAEROBIC Blood Culture adequate volume   Culture NO GROWTH 2 DAYS  Final   Report Status PENDING  Incomplete  Culture, blood (Routine X 2) w Reflex to ID Panel     Status: None (Preliminary result)   Collection Time: 04/01/18  4:41 PM  Result Value Ref Range Status   Specimen Description  BLOOD LEFT WRIST  Final   Special Requests AEROBIC BOTTLE ONLY Blood Culture adequate volume  Final   Culture NO GROWTH 2 DAYS  Final   Report Status PENDING  Incomplete  Culture, Urine     Status: None   Collection Time: 04/01/18  5:05 PM  Result Value Ref Range Status   Specimen Description URINE, RANDOM  Final   Special Requests Immunocompromised  Final   Culture   Final    NO GROWTH Performed at [facility], [place] [address], [place], [place] [address]    Report Status 04/02/2018 FINAL  Final  Culture, respiratory (non-expectorated)     Status: None   Collection Time: 04/01/18  5:05 PM  Result Value Ref Range Status   Specimen Description TRACHEAL ASPIRATE  Final   Special Requests Immunocompromised  Final   Gram Stain   Final    RARE WBC PRESENT, PREDOMINANTLY MONONUCLEAR NO ORGANISMS SEEN    Culture   Final    NO GROWTH 2 DAYS Performed at [facility], [place] [address], [place], [place] [address]    Report Status 04/03/2018 FINAL  Final      Studies:  Ct Chest Wo Contrast  Result Date: 04/03/2018 CLINICAL DATA:  Inpatient.  Respiratory failure. Intubated. Clinical differential includes pneumonia, ARDS and amiodarone toxicity. EXAM: CT CHEST WITHOUT CONTRAST TECHNIQUE: Multidetector CT imaging of the chest was performed following the standard protocol without IV contrast. COMPARISON:  Chest radiograph from earlier today. FINDINGS: Cardiovascular: Mild-to-moderate cardiomegaly. No significant pericardial effusion/thickening. Three-vessel coronary atherosclerosis status post CABG. Right PICC terminates at the cavoatrial junction. Right internal jugular Swan-Ganz catheter terminates within a segmental right lower lobe pulmonary artery branch. Atherosclerotic nonaneurysmal thoracic aorta. Dilated main pulmonary artery (3.6 cm diameter). Mediastinum/Nodes: Mildly heterogeneous thyroid gland with scattered tiny internal calcifications and no discrete thyroid nodules. Enteric  tube terminates in the proximal stomach. Otherwise unremarkable esophagus. No axillary adenopathy. Mild right paratracheal adenopathy up to 1.2 cm (series 4/image 45). No discrete hilar adenopathy on this noncontrast scan. Lungs/Pleura: No pneumothorax. No pleural effusion. Endotracheal tube tip is 1.8 cm above the carina. Bandlike consolidation in bilateral dependent lower lobes with associated mild volume loss, compatible with mild atelectasis. No lung masses or significant pulmonary nodules in the aerated portions of the lungs. There is patchy ground-glass opacity and mild patchy foci of peribronchovascular consolidation throughout both lungs involving all lung lobes. Minimal scattered interlobular septal thickening. No significant regions of bronchiectasis, architectural distortion or frank honeycombing. Upper abdomen: Cholecystectomy. Musculoskeletal: No aggressive appearing focal osseous lesions. Intact sternotomy wires. Moderate thoracic spondylosis. IMPRESSION: 1. Right internal jugular Swan-Ganz catheter terminates within a segmental right lower lobe pulmonary artery branch and should be retracted approximately 5 cm. 2.  Endotracheal tube tip is 1.8 cm above the carina. 3. Mild-to-moderate cardiomegaly. 4. Dilated main pulmonary artery, suggesting pulmonary arterial hypertension. 5. Patchy ground-glass opacity and mild patchy foci of peribronchovascular consolidation throughout both lungs. These findings are nonspecific, with the differential including bronchopneumonia or pulmonary edema. No specific findings of interstitial lung disease. 6. Nonspecific mild right paratracheal adenopathy. No follow-up is required unless as otherwise clinically warranted. This recommendation follows ACR consensus guidelines: Managing Incidental Findings on Thoracic CT: Mediastinal and Cardiovascular Findings. A White Paper of the ACR Incidental Findings Committee. J Am Coll Radiol. 2018; 15: 4008-6761. Aortic Atherosclerosis  (ICD10-I70.0). These results will be called to the ordering clinician or representative by the Radiologist Assistant, and communication documented in the PACS or zVision Dashboard. Electronically Signed   By: Ilona Sorrel M.D.   On: 04/03/2018 13:22   Dg Chest Port 1 View  Result Date: 04/03/2018 CLINICAL DATA:  endotracheal tube present EXAM: PORTABLE CHEST 1 VIEW COMPARISON:  04/02/2018 FINDINGS: Endotracheal tube is in place, tip approximately 2.7 centimeters above the carina. Nasogastric tube is in place, tip beyond the image below the level of the proximal stomach. Swan-Ganz catheter tip overlies the RIGHT LOWER lobe pulmonary artery. Median sternotomy and CABG. RIGHT PICC line tip overlies the superior vena cava. Heart is enlarged. There is streaky perihilar atelectasis on the LEFT. Pulmonary vascular congestion without overt edema. IMPRESSION: 1. Cardiomegaly and vascular congestion. 2. Endotracheal tube is in place, tip approximately 2.7 centimeters above the carina. Electronically Signed   By: Nolon Nations M.D.   On: 04/03/2018 07:46   Dg Chest Port 1 View  Result Date: 04/02/2018 CLINICAL DATA:  Central line placement EXAM: PORTABLE CHEST 1 VIEW COMPARISON:  04/02/2018 and prior radiographs FINDINGS: An endotracheal tube with tip 4.5 cm above the carina, NG tube entering the stomach with tip off the field of view and RIGHT PICC line with tip overlying the LOWER SVC again noted. A RIGHT IJ Swan-Ganz catheter has been placed with tip overlying the  RIGHT interlobar pulmonary artery. Cardiomegaly, CABG changes and interstitial pulmonary edema again noted. LEFT basilar atelectasis with probable small LEFT pleural effusion again noted. No pneumothorax. IMPRESSION: RIGHT IJ Swan-Ganz catheter placement.  No pneumothorax. No other significant change. Electronically Signed   By: Margarette Canada M.D.   On: 04/02/2018 17:25    Assessment: 73 y.o. with past medical history of asthma, OSA, on home oxygen 2 L  at night, coronary artery disease, status post CABG and multiple PCI, atrial fibrillation on Eliquis, CVA, hypertension, CKD, diabetes, presented with fever and cough, hypertension urgency, acute on chronic respite failure required intubation, with consult for thrombocytopenia with platelet less than 5k  1. Thrombocytopenia, probable infection induced ITP, vs sepsis induced thrombocytopenia  2.  Acute on chronic hypoxic respiratory failure, improving  3.  Acute delirium, CT head negative for acute findings 4.  Acute on chronic systolic congestive heart failure 5. Acute on chronic renal failure, improving  6. HTN, CAD, AF 7.  Community-acquired pneumonia 8. DM with hyperglycemia     Plan:  -Her thrombocytopenia is improving nicely, platelet count 139K this morning.  She has received IVIG 1 mg/kg daily for 2 days, and responded well.    -other management per primary team -we will f/u    Mikey Bussing, NP 04/04/2018  1:48 PM

## 2018-04-04 NOTE — Progress Notes (Addendum)
Patient ID: [name] [name], female   DOB: 1945-08-21, 73 y.o.   [name]: [id number]    Advanced Heart Failure Rounding Note   Subjective:    Yesterday vasopressin and NE weaned off and she was extubated. Overnight she has been confused.   Swan #s RA 5  PA 38/12  CO/CI 80 /3.8   Initial swan numbers 3/1 RA = 3 PA = 42/18 (26) PCW = 10 Fick cardiac output/index = 7.4/3.4 PVR = 2.1 WU SVR = 807  Echo EF 25-30% with global HK worse from mid-ventricle to apex. RV moderately down. (Echo 1/19 EF 55-60%)  CT chest w/o contrast: No evidence for ILD, patchy ground glass infiltrates noted bilaterally, PNA vs edema.   Objective:   Weight Range:  Vital Signs:   Temp:  [99.1 F (37.3 C)-99.7 F (37.6 C)] 99.5 F (37.5 C) (03/03 0700) Pulse Rate:  [66-92] 86 (03/03 0700) Resp:  [11-19] 14 (03/03 0700) BP: (126-172)/(46-67) 151/55 (03/03 0700) SpO2:  [97 %-100 %] 97 % (03/03 0700) Arterial Line BP: (117-150)/(41-55) 117/41 (03/02 1526) FiO2 (%):  [40 %] 40 % (03/02 1306) Weight:  [116.8 kg] 116.8 kg (03/03 0500) Last BM Date: 03/25/18  Weight change: Filed Weights   04/01/18 0500 04/02/18 0500 04/04/18 0500  Weight: 125.3 kg 126.6 kg 116.8 kg    Intake/Output:   Intake/Output Summary (Last 24 hours) at 04/04/2018 0723 Last data filed at 04/04/2018 0600 Gross per 24 hour  Intake 881.23 ml  Output 2265 ml  Net -1383.77 ml     Physical Exam: CVP 5  General:   No resp difficulty HEENT: normal Neck: supple. no JVD. Carotids 2+ bilat; no bruits. No lymphadenopathy or thryomegaly appreciated. RIJ swan.  Cor: PMI nondisplaced. Regular rate & rhythm. No rubs, gallops or murmurs. Lungs: clear on 1 liters  Abdomen: soft, nontender, nondistended. No hepatosplenomegaly. No bruits or masses. Good bowel sounds. Extremities: no cyanosis, clubbing, rash, edema. RUE PICC  Neuro: alert & orientedx1 to self, cranial nerves grossly intact. moves all 4 extremities w/o difficulty. Affect  pleasant  Telemetry: SR 70-80s   Labs: Basic Metabolic Panel: Recent Labs  Lab 03/29/18 0518  04/01/18 0450 04/01/18 1539 04/01/18 1648 04/01/18 1810 04/02/18 0324 04/03/18 0444 04/04/18 0413  NA 139   < > 134* 136 133* 135 139 144 147*  K 2.9*   < > 3.3* 3.5 5.2* 3.5 3.1* 3.9 3.2*  CL 80*   < > 82* 83*  --  85* 91* 101 104  CO2 45*   < > 42* 38*  --  41* 37* 30 33*  GLUCOSE 283*   < > 209* 275*  --  280* 115* 182* 143*  BUN 38*   < > 93* 90*  --  92* 87* 48* 31*  CREATININE 1.79*   < > 3.17* 2.94*  --  2.88* 2.53* 1.65* 1.27*  CALCIUM 8.4*   < > 8.7* 8.9  --  8.9 8.8* 8.5* 8.9  MG 2.1  --  2.3 2.3  --   --  2.3 2.0 2.1  PHOS 4.0  --   --  5.2*  --   --  2.1* 2.4* 1.8*   < > = values in this interval not displayed.    Liver Function Tests: No results for input(s): AST, ALT, ALKPHOS, BILITOT, PROT, ALBUMIN in the last 168 hours. No results for input(s): LIPASE, AMYLASE in the last 168 hours. No results for input(s): AMMONIA in the last 168 hours.  CBC: Recent Labs  Lab 04/01/18 0450 04/01/18 1539 04/01/18 1648 04/02/18 0324 04/03/18 0444 04/04/18 0413  WBC 12.2* 14.9*  --  18.1* 15.9* 13.8*  NEUTROABS  --  12.3*  --   --  11.8* 10.5*  HGB 12.5 13.5 13.6 12.6 11.7* 11.0*  HCT 40.1 44.1 40.0 38.7 36.7 36.7  MCV 97.1 96.3  --  94.9 97.9 99.5  PLT 93* 117*  --  136* 137* 139*    Cardiac Enzymes: Recent Labs  Lab 04/01/18 1539 04/02/18 0324 04/03/18 0444  TROPONINI 0.10* 0.13* 0.13*    BNP: BNP (last 3 results) Recent Labs    01/16/18 2150 03/25/18 1630  BNP 701.3* 1,480.1*    ProBNP (last 3 results) No results for input(s): PROBNP in the last 8760 hours.    Other results:  Imaging: Ct Chest Wo Contrast  Result Date: 04/03/2018 CLINICAL DATA:  Inpatient. Respiratory failure. Intubated. Clinical differential includes pneumonia, ARDS and amiodarone toxicity. EXAM: CT CHEST WITHOUT CONTRAST TECHNIQUE: Multidetector CT imaging of the chest was  performed following the standard protocol without IV contrast. COMPARISON:  Chest radiograph from earlier today. FINDINGS: Cardiovascular: Mild-to-moderate cardiomegaly. No significant pericardial effusion/thickening. Three-vessel coronary atherosclerosis status post CABG. Right PICC terminates at the cavoatrial junction. Right internal jugular Swan-Ganz catheter terminates within a segmental right lower lobe pulmonary artery branch. Atherosclerotic nonaneurysmal thoracic aorta. Dilated main pulmonary artery (3.6 cm diameter). Mediastinum/Nodes: Mildly heterogeneous thyroid gland with scattered tiny internal calcifications and no discrete thyroid nodules. Enteric tube terminates in the proximal stomach. Otherwise unremarkable esophagus. No axillary adenopathy. Mild right paratracheal adenopathy up to 1.2 cm (series 4/image 45). No discrete hilar adenopathy on this noncontrast scan. Lungs/Pleura: No pneumothorax. No pleural effusion. Endotracheal tube tip is 1.8 cm above the carina. Bandlike consolidation in bilateral dependent lower lobes with associated mild volume loss, compatible with mild atelectasis. No lung masses or significant pulmonary nodules in the aerated portions of the lungs. There is patchy ground-glass opacity and mild patchy foci of peribronchovascular consolidation throughout both lungs involving all lung lobes. Minimal scattered interlobular septal thickening. No significant regions of bronchiectasis, architectural distortion or frank honeycombing. Upper abdomen: Cholecystectomy. Musculoskeletal: No aggressive appearing focal osseous lesions. Intact sternotomy wires. Moderate thoracic spondylosis. IMPRESSION: 1. Right internal jugular Swan-Ganz catheter terminates within a segmental right lower lobe pulmonary artery branch and should be retracted approximately 5 cm. 2.  Endotracheal tube tip is 1.8 cm above the carina. 3. Mild-to-moderate cardiomegaly. 4. Dilated main pulmonary artery, suggesting  pulmonary arterial hypertension. 5. Patchy ground-glass opacity and mild patchy foci of peribronchovascular consolidation throughout both lungs. These findings are nonspecific, with the differential including bronchopneumonia or pulmonary edema. No specific findings of interstitial lung disease. 6. Nonspecific mild right paratracheal adenopathy. No follow-up is required unless as otherwise clinically warranted. This recommendation follows ACR consensus guidelines: Managing Incidental Findings on Thoracic CT: Mediastinal and Cardiovascular Findings. A White Paper of the ACR Incidental Findings Committee. J Am Coll Radiol. 2018; 15: 8453-6468. Aortic Atherosclerosis (ICD10-I70.0). These results will be called to the ordering clinician or representative by the Radiologist Assistant, and communication documented in the PACS or zVision Dashboard. Electronically Signed   By: Ilona Sorrel M.D.   On: 04/03/2018 13:22   Dg Chest Port 1 View  Result Date: 04/03/2018 CLINICAL DATA:  endotracheal tube present EXAM: PORTABLE CHEST 1 VIEW COMPARISON:  04/02/2018 FINDINGS: Endotracheal tube is in place, tip approximately 2.7 centimeters above the carina. Nasogastric tube is in place, tip beyond the image below  the level of the proximal stomach. Swan-Ganz catheter tip overlies the RIGHT LOWER lobe pulmonary artery. Median sternotomy and CABG. RIGHT PICC line tip overlies the superior vena cava. Heart is enlarged. There is streaky perihilar atelectasis on the LEFT. Pulmonary vascular congestion without overt edema. IMPRESSION: 1. Cardiomegaly and vascular congestion. 2. Endotracheal tube is in place, tip approximately 2.7 centimeters above the carina. Electronically Signed   By: Nolon Nations M.D.   On: 04/03/2018 07:46   Dg Chest Port 1 View  Result Date: 04/02/2018 CLINICAL DATA:  Central line placement EXAM: PORTABLE CHEST 1 VIEW COMPARISON:  04/02/2018 and prior radiographs FINDINGS: An endotracheal tube with tip 4.5 cm  above the carina, NG tube entering the stomach with tip off the field of view and RIGHT PICC line with tip overlying the LOWER SVC again noted. A RIGHT IJ Swan-Ganz catheter has been placed with tip overlying the RIGHT interlobar pulmonary artery. Cardiomegaly, CABG changes and interstitial pulmonary edema again noted. LEFT basilar atelectasis with probable small LEFT pleural effusion again noted. No pneumothorax. IMPRESSION: RIGHT IJ Swan-Ganz catheter placement.  No pneumothorax. No other significant change. Electronically Signed   By: Margarette Canada M.D.   On: 04/02/2018 17:25   Dg Chest Port 1 View  Result Date: 04/02/2018 CLINICAL DATA:  Respiratory failure EXAM: PORTABLE CHEST 1 VIEW COMPARISON:  04/01/2018 FINDINGS: Cardiac shadow remains enlarged. Postsurgical changes are again seen. Endotracheal tube, gastric catheter and right-sided PICC line are again noted and stable. Persistent pulmonary vascular congestion with interval development of a interstitial edema is seen. No focal confluent infiltrate is noted. IMPRESSION: Postsurgical changes with tubes and lines as described above. Persistent pulmonary vascular congestion is noted with new interstitial edema. Electronically Signed   By: Inez Catalina M.D.   On: 04/02/2018 08:24     Medications:     Scheduled Medications: . ALPRAZolam  0.5 mg Oral BID  . amiodarone  100 mg Oral Daily  . bisacodyl  5 mg Oral Daily  . budesonide (PULMICORT) nebulizer solution  0.25 mg Nebulization BID  . Chlorhexidine Gluconate Cloth  6 each Topical Daily  . clopidogrel  75 mg Oral Daily  . docusate  50 mg Per Tube Daily  . DULoxetine  30 mg Oral Daily  . ezetimibe  10 mg Oral QHS  . heparin  5,000 Units Subcutaneous Q8H  . insulin aspart  1-3 Units Subcutaneous Q4H  . ipratropium-albuterol  3 mL Nebulization Q6H  . multivitamin  1 tablet Oral Daily  . pantoprazole sodium  40 mg Per Tube Q1200  . prochlorperazine  5 mg Intravenous Once  . rosuvastatin  40  mg Oral QHS  . sodium chloride flush  10-40 mL Intracatheter Q12H  . vitamin B-12  1,000 mcg Oral Daily    Infusions: . sodium chloride 10 mL/hr at 04/04/18 0521  . ceFEPime (MAXIPIME) IV Stopped (04/03/18 1555)  . fentaNYL infusion INTRAVENOUS Stopped (04/03/18 1305)  . norepinephrine (LEVOPHED) Adult infusion Stopped (04/03/18 1500)  . potassium chloride    . vasopressin (PITRESSIN) infusion - *FOR SHOCK* Stopped (04/03/18 1700)    PRN Medications: sodium chloride, fentaNYL, hydrocortisone cream, midazolam, nitroGLYCERIN, sodium chloride flush   Assessment:   [name] [name] is a 73 y.o. female with multiple medical problems including a hx of CAD s/p CABG and multiple PCI, HFpEF s/p CardioMEMS, AF, cerebrovascular disease, CKD, DM2, PAD, admitted 2/22 with acute respiratory failure requiring mechanical intubation in the setting of URI with fever to 102 and cough.  Plan/Discussion:    1. Acute hypoxemic respiratory failure: Suspect combination of pulmonary edema and likely PNA.  Initially intubated, now extubated.  Reintubated on 2/29. CXR with diffuse bilateral airspace disease and PCT 3.08=>2.39 => 0.26.=>0.13  -Extubated 3/2 - Check ESR & auto-immune panels.  ? Hypersensitivity pneumonitis - Sed rate 113  - CT chest- mild patchy foci in both lungs.  2. Shock - Concern ARDS and vasodilatory shock - Vasopressin added 3/1 after RHC.  - Vasopressin and NE stopped 3/2.   3. ID: Suspect PNA.  Initial fever to 102.6, now afebrile. PCT down to 0.26. WBC 18.1>15.9  - Was on levofloxacin.  Now on cefapime per CCM - Bcx and sputum cx from 2/29 NGTD 4. Acute systolic CHF: Echo with EF 25-30%, severe hypokinesis-akinesis of the mid-apical LV myocardium. Prior echo with normal EF.  ?Takotsubo event with stress of acute illness.  However, she has known extensive coronary disease as well.  - CO-OX  77%.  -Creatinine normalized.  - Restart metoprolol 25 mg twice a day  - Add 12.5 mg spiro  daily.  -  ARB/ARNI or spiro with shock/AKI 5. AKI on CKD: stage 3, baseline 1.4-1.8.  - Creatinine trending down 2.5>1.65 >1.2 5. CAD: Last cath 9/19 with severe native CAD and LIMA patent. Sequential SVG-PLOM/OM1 with 80% proximal stenosis, s/p DES. SVG to RCA chronically occluded. No CP.  Mild elevation in troponin with no trend, likely demand ischemia. Echo with newly low EF, may be Takotsubo.  - Continue statin.  - Plavix restarted 2/26 with plts rising.  - Cath considered with fall in EF. But with renal failure will defer.  6. Atrial fibrillation: Paroxysmal.  - Maintaining NSR. Stop amio for now. ? amio toxicity.  - Remains in NSR. Off anticoagulation with fall in platelets.  - PLTs 139 today.  - Think we can restart her apixaban today.  7. Thrombocytopenia: ?ITP triggered by acute infection.  Doubt TTP, no schistocytes/evidence for hemolysis.  HIT negative.  Got 2 units plts 2/25.  Given a dose of Solumedrol and 2 doses IVIG.  -  Plts 139 K  - Hematology has seen 8. Hypokalemia - Supp K.  9. Delerium  10. Hypernatremia.  11. Long QT: Avoid Haldol.   Luiz Blare out today.Consult PT.   Darrick Grinder, NP-C  04/04/2018 7:23 AM  Patient seen with NP, agree with the above note.   She has been extubated.  CVP 5-6 with good cardiac output off Swan and co-ox 78%.  She has been confused overnight. She is off pressors with SBP 150s-160s.   CT chest with bilateral patchy ground glass infiltrates, PNA vs edema.   On exam, awake but confused.  Oriented to person, knows who I am.  No JVD.  Coarse BS bilaterally. No edema.  Regular S1S2.   Suspect septic shock with PNA based on low SVR and low filling pressures along with bilateral lung infiltrates and high PCT with ESR 113.  So far, cultures negative.  She is now off pressors and extubated. CT chest with patchy bilateral ground glass, PNA vs edema.  Cardiogenic edema unlikely with low CVP and PCWP.   - Continue cefepime per pulmonary.  -  Amiodarone toxicity considered though would not explain shock.  Will stop amiodarone for now, but if pulmonary thinks this is unlikely can restart.  - No diuretics with low CVP.    Low EF noted on echo, ?Takotsubo/stress event versus ischemic event (think ischemic probably less likely).  - Resume  Toprol XL 25 mg bid.  - Resume spironolactone 12.5 daily.  - Possible eventual coronary angiography, will make sure creatinine is stabilized out first.   Creatinine trending down, now at 1.27.  No diuretic with low CVP.   Plts up to 139.  Continue Plavix.  I think she can restart Eliquis at this point.   Delirium likely due to infection + ICU/intubation. Was delirious after last extubation as well.   CRITICAL CARE Performed by: Loralie Champagne  Total critical care time: 40 minutes  Critical care time was exclusive of separately billable procedures and treating other patients.  Critical care was necessary to treat or prevent imminent or life-threatening deterioration.  Critical care was time spent personally by me on the following activities: development of treatment plan with patient and/or surrogate as well as nursing, discussions with consultants, evaluation of patient's response to treatment, examination of patient, obtaining history from patient or surrogate, ordering and performing treatments and interventions, ordering and review of laboratory studies, ordering and review of radiographic studies, pulse oximetry and re-evaluation of patient's condition.  Loralie Champagne 04/04/2018 7:47 AM

## 2018-04-04 NOTE — Progress Notes (Signed)
eLink Physician-Brief Progress Note Patient Name: [name] DOB: [birth year] [name]: [id number]   Date of Service  04/04/2018  HPI/Events of Note  Elevated blood pressure. Pt needs prn BP medication order.  eICU Interventions  Prn Hydralazine order placed for systolic blood pressure > 150 mmHg        Jonni Oelkers U Sophiea Ueda 04/04/2018, 9:08 PM

## 2018-04-04 NOTE — Progress Notes (Signed)
[facility] Progress Note Patient Name: [name] [name] DOB: [birth year] [name]: [id number]   Date of Service  04/04/2018  HPI/Events of Note  Blood sugar > 250 mg % on sensitive insulin coverage regimen despite coverage.  eICU Interventions  Patient switched to moderate scale.        Kerry Kass Hedy Garro 04/04/2018, 10:06 PM

## 2018-04-04 NOTE — Progress Notes (Addendum)
NAME:  [name] [name], [name]:  [id number], DOB:  08/16/1945, LOS: 99 ADMISSION DATE:  03/25/2018, CONSULTATION DATE:  03/25/2018 REFERRING MD:  ED physician, CHIEF COMPLAINT:  Acute hypoxic respiratory failure   Brief History   73 year old woman with history of asthma, OSA not on CPAP, chronic hypoxic respiratory failure on 2L at night, CAD s/p CABG and multiple PCI, HFpEF s/p CardioMEMS 01/16/2018, AF on Eliquis, cerebrovascular disease, HTN, CKD, DM2, PAD presenting with cough, feeling poorly for the past week with cough productive of sputum. She has been having intermittent emesis and post tussive emesis. She has not been able to take her medications because of her emesis.She was noted to be hypoxic to the 70s this afternoon when her daughter checked.   In the ED, pt febrile to 102.6 with SBP 170-209, HR 70s, O2 in the mid 80s. ECG showed sinus rhythm with LBBB-like IVCD. VBG 7.38/50. Labs included Cr 1.34 (at/improved from baseline), BNP 1480 (last 701 in 01/2018), troponin 0.05. Lactate 1.8. WBC 9.4. Flu panel negative. CXR showed cardiomegaly and bilateral infiltrates. She was intubated in the ED and started on IV aztreonam. IV furosemide was given, and NTG infusion was started for BP control.    Past Medical History  asthma, OSA not on CPAP, chronic hypoxic respiratory failure on 2L at night, CAD s/p CABG and multiple PCI, HFpEF s/p CardioMEMS, AF on Eliquis, cerebrovascular disease, HTN, CKD, DM2, PAD  Significant Hospital Events   2/22 > admit, intubated. Urine with e colii 2/23- echo EF 25-30% with global HK worse from mid-ventricle to apex. RV moderately down. (Echo 1/19 EF 55-60%) 2/25- NAEO. Improved nausea with phenergan. Transferrred and ccm signed off. Plat < 5k). HITT - negtive 2/26 - CT head - nil acute (for ITP - > Received 1 unit of platelets on 03/28/2018 and IVIG 1 mg/kg X 2 doses 03/28/2018 and 03/29/2018 2.27 - platelet upat 74K . Concerne is ITP (not TTP). Ongoing delirum but  improved. Cards recommendnig to limit sterods. Cardd ddx - acute IHD s-CHF v Takasubo. Maintained on amio for PAF. Rx with levaquin for CAP NPOS 2/28 extubated 2/29 re-intubated 3/3: intubated 03/25/2018 through 03/27/2018. Reintubated 04/01/2018. Etiology not entirely clear. Diffuse infiltrate with working diagnosis of pulmonary  edema vs infection vs inflammatory process. S/p treatment with antibiotics. Concern for amiodarone toxicity. Overall she is improved.  Extubated 3/2 passed swallowing evaluation.  Consults:  Cardiology 2/22 Hematology  Procedures:  Intubated 2/22>2/24, 2/29 >> PA cath 3/1 >>out  Significant Diagnostic Tests:  CXR 2/22 > Cardiomegaly. Worsening bilateral airspace disease CXR 2/24> pulmonary edema pattern CXR 2/25> worsening RUL opacity   Micro Data:  BCx x 2 2/22 >> no growth 24 hours MRSA pcr 2/23 - neg  Resp cx 2/22 >>  UCx 2/22 >> e coli  Influenza PCR 2/22 >> neg 2/29 blood >>> 2/29 URine > neg 2/29 > sputum neg  Antimicrobials:  Aztreonam 2/22 Levaquin 2/23 Vancomycin 2/23 Cefepime 2/23>>  Interim history/subjective:  Awake. Still confused. No distress.   Objective   Blood pressure (Abnormal) 193/89, pulse (Abnormal) 102, temperature 98.2 F (36.8 C), temperature source Oral, resp. rate 17, height 5\' 2"  (5.726 m), weight 116.8 kg, SpO2 100 %. PAP: (22-41)/(12-23) 27/14 CVP:  [2 mmHg-9 mmHg] 5 mmHg  Vent Mode: PSV;CPAP FiO2 (%):  [40 %] 40 % Set Rate:  [14 bmp] 14 bmp Vt Set:  [400 mL] 400 mL PEEP:  [5 cmH20] 5 cmH20 Pressure Support:  [10 [place]  Pressure:  [19 cmH20] 19 cmH20   Intake/Output Summary (Last 24 hours) at 04/04/2018 1047 Last data filed at 04/04/2018 0900 Gross per 24 hour  Intake 648.7 ml  Output 2190 ml  Net -1541.3 ml   Filed Weights   04/01/18 0500 04/02/18 0500 04/04/18 0500  Weight: 125.3 kg 126.6 kg 116.8 kg   General: Pleasant 73 year old female sitting in bed she is in no acute distress HEENT:  Normocephalic atraumatic no jugular venous distention Pulmonary: Crackles bases no accessory use Cardiac: Regular rate and rhythm Abdomen: Soft nontender Extremities: Warm dry brisk capillary refill Neuro: Awake, slightly confused, follows commands. GU: Clear yellow  Assessment & Plan:   Acute hypoxemic respiratory failure in setting of diffuse pulmonary infiltrates: Etiology not entirely clear, question hypersensitivity pneumonitis possibly from amiodarone versus ARDS, vs flash edema in setting of takotsubo CM Successfully extubated 3/2 Plan Wean oxygen Pulmonary hygiene Aspiration precautions Cefepime day number 4 of 7 Repeat chest x-ray in a.m. 3/4 Follow-up autoimmune panel Continue current dose of methylprednisone at 40 mg every 6 hours, for another 48 hours then initiate taper  Shock, hemodynamics consistent with sepsis superimposed on underlying cardiomyopathy, wonder about Takotsubo event -Now off pressors -WBC is trending down Plan Keep euvolemic to negative as tolerated Continue telemetry monitoring Further recommendations per cardiology Most recent cultures negative, will empirically continue cefepime for total of 7 days  Acute HFrEF:  LVEF 25%. She has extensive cardiac history, which is the likely culprit in the setting of critical illness, however, HF mentions some concern of Takotsubo. Plan Restarting metoprolol 25 mg twice daily Spironolactone 12.5 mg daily Further recommendations per cardiology  CAD s/p CABG  Plan Cont asa and plavix May need left heart cath, but renal function barrier to this at this point  A Fib complicated by QTc prolongation :  558msec 04/01/2018 Plan Low-dose Lopressor Holding amiodarone given possible toxicity Restarting apixaban cardiology  AKI on CKD - Creatinine improving. Renal US 04/01/18 without hydro. -Renal function improving Plan Avoiding hypotension Daily chemistries  Fluid and electrolyte imbalance: Hypernatremia,  hypokalemia Plan Allowing p.o. water intake Replace potassium Follow-up chemistry a.m.  Thrombocytopenia:  Suspected ITP  (for ITP - > Received 1 unit of platelets on 03/28/2018 and IVIG 1 mg/kg X 2 doses 03/28/2018 and 03/29/2018) -Continues to improve Plan Trend CBC  DM   Plan Sliding scale insulin  Anxiety/Depression on wellbutrin, cymbalta, gabapentin, xanax, vicodin, robaxin, xanax at home Plan Added back Cymbalta and Xanax Can add Wellbutrin back as well as possibly gabapentin We will hold off on Vicodin and Robaxin for now     Best practice:  Diet: NPO TG held for nausea.  Pain/Anxiety/Delirium protocol (if indicated): fent gtt/PRN, delirium precautions VAP protocol (if indicated): ordered DVT prophylaxis: SCDs GI prophylaxis: PPI Glucose control: SSI Mobility: as tolerated  Code Status: FULL Family Communication: No family bedside 3/2 Disposition: Keep her physically in the ICU but can de-escalate monitoring.  If stable overnight can move out of the intensive care.  Working diagnosis of sepsis, possible amiodarone related pneumonitis acutely complicated by stress cardiomyopathy  Erick Colace ACNP-BC [place] Pager # [phone] OR # [phone] if no answer  Attending Note:  73 year old female with history of asthma presenting with respiratory failure that was extubated on 3/2.  Overnight had significant N/V.  On exam, she is lethargic but moving all ext to command.  I reviewed CXR myself, concern for evolving pneumonia.  Discussed with PCCM-NP.  Will start cefepime.  Continue steroids for now due to concern for amiodarone lung.  Hold in the ICU overnight for observation due to concern for evolving respiratory failure.  PCCM will continue to manage.  The patient is critically ill with multiple organ systems failure and requires high complexity decision making for assessment and support, frequent evaluation and titration of therapies, application of  advanced monitoring technologies and extensive interpretation of multiple databases.   Critical Care Time devoted to patient care services described in this note is  32  Minutes. This time reflects time of care of this signee Dr Jennet Maduro. This critical care time does not reflect procedure time, or teaching time or supervisory time of PA/NP/Med student/Med Resident etc but could involve care discussion time.  Rush Farmer, M.D. [facility] Pulmonary/Critical Care Medicine. Pager: [phone]. After hours pager: [phone].

## 2018-04-05 ENCOUNTER — Ambulatory Visit (HOSPITAL_COMMUNITY): Payer: Medicare Other

## 2018-04-05 ENCOUNTER — Inpatient Hospital Stay (HOSPITAL_COMMUNITY): Payer: Medicare Other

## 2018-04-05 DIAGNOSIS — J984 Other disorders of lung: Secondary | ICD-10-CM

## 2018-04-05 DIAGNOSIS — T462X5A Adverse effect of other antidysrhythmic drugs, initial encounter: Secondary | ICD-10-CM

## 2018-04-05 DIAGNOSIS — R5381 Other malaise: Secondary | ICD-10-CM | POA: Diagnosis present

## 2018-04-05 DIAGNOSIS — N179 Acute kidney failure, unspecified: Secondary | ICD-10-CM

## 2018-04-05 LAB — CBC WITH DIFFERENTIAL/PLATELET
Abs Immature Granulocytes: 0.28 10*3/uL — ABNORMAL HIGH (ref 0.00–0.07)
Basophils Absolute: 0 10*3/uL (ref 0.0–0.1)
Basophils Relative: 0 %
Eosinophils Absolute: 0 10*3/uL (ref 0.0–0.5)
Eosinophils Relative: 0 %
HCT: 37.6 % (ref 36.0–46.0)
Hemoglobin: 11.5 g/dL — ABNORMAL LOW (ref 12.0–15.0)
Immature Granulocytes: 2 %
Lymphocytes Relative: 6 %
Lymphs Abs: 0.9 10*3/uL (ref 0.7–4.0)
MCH: 30 pg (ref 26.0–34.0)
MCHC: 30.6 g/dL (ref 30.0–36.0)
MCV: 98.2 fL (ref 80.0–100.0)
Monocytes Absolute: 0.6 10*3/uL (ref 0.1–1.0)
Monocytes Relative: 4 %
Neutro Abs: 12.6 10*3/uL — ABNORMAL HIGH (ref 1.7–7.7)
Neutrophils Relative %: 88 %
Platelets: 178 10*3/uL (ref 150–400)
RBC: 3.83 MIL/uL — ABNORMAL LOW (ref 3.87–5.11)
RDW: 13.3 % (ref 11.5–15.5)
WBC: 14.3 10*3/uL — ABNORMAL HIGH (ref 4.0–10.5)
nRBC: 0 % (ref 0.0–0.2)

## 2018-04-05 LAB — COOXEMETRY PANEL
Carboxyhemoglobin: 1.6 % — ABNORMAL HIGH (ref 0.5–1.5)
Methemoglobin: 1.7 % — ABNORMAL HIGH (ref 0.0–1.5)
O2 Saturation: 79.6 %
Total hemoglobin: 12.2 g/dL (ref 12.0–16.0)

## 2018-04-05 LAB — BASIC METABOLIC PANEL
Anion gap: 8 (ref 5–15)
BUN: 32 mg/dL — ABNORMAL HIGH (ref 8–23)
CO2: 31 mmol/L (ref 22–32)
Calcium: 9.2 mg/dL (ref 8.9–10.3)
Chloride: 104 mmol/L (ref 98–111)
Creatinine, Ser: 1.36 mg/dL — ABNORMAL HIGH (ref 0.44–1.00)
GFR calc Af Amer: 46 mL/min — ABNORMAL LOW (ref >60)
GFR calc non Af Amer: 39 mL/min — ABNORMAL LOW (ref >60)
Glucose, Bld: 286 mg/dL — ABNORMAL HIGH (ref 70–99)
Potassium: 4.5 mmol/L (ref 3.5–5.1)
Sodium: 143 mmol/L (ref 135–145)

## 2018-04-05 LAB — MAGNESIUM: Magnesium: 2 mg/dL (ref 1.7–2.4)

## 2018-04-05 LAB — MULTIPLE MYELOMA PANEL, SERUM
Albumin SerPl Elph-Mcnc: 2.3 g/dL — ABNORMAL LOW (ref 2.9–4.4)
Albumin/Glob SerPl: 0.6 — ABNORMAL LOW (ref 0.7–1.7)
Alpha 1: 0.3 g/dL (ref 0.0–0.4)
Alpha2 Glob SerPl Elph-Mcnc: 0.9 g/dL (ref 0.4–1.0)
B-Globulin SerPl Elph-Mcnc: 0.7 g/dL (ref 0.7–1.3)
Gamma Glob SerPl Elph-Mcnc: 1.9 g/dL — ABNORMAL HIGH (ref 0.4–1.8)
Globulin, Total: 3.9 g/dL (ref 2.2–3.9)
IgA: 256 mg/dL (ref 87–352)
IgG (Immunoglobin G), Serum: 2154 mg/dL — ABNORMAL HIGH (ref 700–1600)
IgM (Immunoglobulin M), Srm: 110 mg/dL (ref 26–217)
Total Protein ELP: 6.2 g/dL (ref 6.0–8.5)

## 2018-04-05 LAB — GLUCOSE, CAPILLARY
Glucose-Capillary: 215 mg/dL — ABNORMAL HIGH (ref 70–99)
Glucose-Capillary: 254 mg/dL — ABNORMAL HIGH (ref 70–99)
Glucose-Capillary: 267 mg/dL — ABNORMAL HIGH (ref 70–99)
Glucose-Capillary: 284 mg/dL — ABNORMAL HIGH (ref 70–99)
Glucose-Capillary: 274 mg/dL — ABNORMAL HIGH (ref 70–99)
Glucose-Capillary: 292 mg/dL — ABNORMAL HIGH (ref 70–99)

## 2018-04-05 LAB — PHOSPHORUS: Phosphorus: 2.4 mg/dL — ABNORMAL LOW (ref 2.5–4.6)

## 2018-04-05 MED ORDER — INSULIN ASPART 100 UNIT/ML ~~LOC~~ SOLN
0.0000 [IU] | Freq: Three times a day (TID) | SUBCUTANEOUS | Status: DC
  Administered 2018-04-05 (×2): 11 [IU] via SUBCUTANEOUS
  Administered 2018-04-06 (×2): 20 [IU] via SUBCUTANEOUS
  Administered 2018-04-06: 11 [IU] via SUBCUTANEOUS
  Administered 2018-04-07: 7 [IU] via SUBCUTANEOUS
  Administered 2018-04-07: 15 [IU] via SUBCUTANEOUS
  Administered 2018-04-07: 7 [IU] via SUBCUTANEOUS
  Administered 2018-04-08: 20 [IU] via SUBCUTANEOUS
  Administered 2018-04-08 (×2): 7 [IU] via SUBCUTANEOUS
  Administered 2018-04-09: 11 [IU] via SUBCUTANEOUS
  Administered 2018-04-09: 3 [IU] via SUBCUTANEOUS
  Administered 2018-04-10: 15 [IU] via SUBCUTANEOUS
  Administered 2018-04-10: 4 [IU] via SUBCUTANEOUS
  Administered 2018-04-10: 11 [IU] via SUBCUTANEOUS

## 2018-04-05 MED ORDER — BUPROPION HCL ER (XL) 150 MG PO TB24
150.0000 mg | ORAL_TABLET | Freq: Every day | ORAL | Status: DC
  Administered 2018-04-05 – 2018-04-10 (×6): 150 mg via ORAL
  Filled 2018-04-05 (×6): qty 1

## 2018-04-05 MED ORDER — LOSARTAN POTASSIUM 25 MG PO TABS
12.5000 mg | ORAL_TABLET | Freq: Every day | ORAL | Status: DC
  Administered 2018-04-05 – 2018-04-07 (×3): 12.5 mg via ORAL
  Filled 2018-04-05 (×3): qty 1

## 2018-04-05 MED ORDER — SPIRONOLACTONE 25 MG PO TABS
25.0000 mg | ORAL_TABLET | Freq: Every day | ORAL | Status: DC
  Administered 2018-04-05 – 2018-04-10 (×6): 25 mg via ORAL
  Filled 2018-04-05 (×6): qty 1

## 2018-04-05 MED ORDER — INSULIN ASPART 100 UNIT/ML ~~LOC~~ SOLN
0.0000 [IU] | Freq: Every day | SUBCUTANEOUS | Status: DC
  Administered 2018-04-05 – 2018-04-07 (×3): 3 [IU] via SUBCUTANEOUS
  Administered 2018-04-09: 4 [IU] via SUBCUTANEOUS

## 2018-04-05 MED ORDER — GABAPENTIN 300 MG PO CAPS
600.0000 mg | ORAL_CAPSULE | Freq: Two times a day (BID) | ORAL | Status: DC
  Administered 2018-04-05 – 2018-04-10 (×11): 600 mg via ORAL
  Filled 2018-04-05 (×11): qty 2

## 2018-04-05 MED ORDER — METOPROLOL SUCCINATE ER 50 MG PO TB24
50.0000 mg | ORAL_TABLET | Freq: Two times a day (BID) | ORAL | Status: DC
  Administered 2018-04-05 – 2018-04-07 (×6): 50 mg via ORAL
  Filled 2018-04-05 (×7): qty 1

## 2018-04-05 MED ORDER — INSULIN ASPART 100 UNIT/ML ~~LOC~~ SOLN
6.0000 [IU] | Freq: Three times a day (TID) | SUBCUTANEOUS | Status: DC
  Administered 2018-04-05 – 2018-04-07 (×8): 6 [IU] via SUBCUTANEOUS

## 2018-04-05 MED ORDER — TORSEMIDE 20 MG PO TABS
40.0000 mg | ORAL_TABLET | Freq: Every day | ORAL | Status: DC
  Administered 2018-04-05 – 2018-04-07 (×3): 40 mg via ORAL
  Filled 2018-04-05 (×3): qty 2

## 2018-04-05 MED ORDER — GABAPENTIN 400 MG PO CAPS
1200.0000 mg | ORAL_CAPSULE | Freq: Every day | ORAL | Status: DC
  Administered 2018-04-05 – 2018-04-09 (×5): 1200 mg via ORAL
  Filled 2018-04-05 (×5): qty 3

## 2018-04-05 NOTE — Progress Notes (Signed)
Patient ID: [name] [name], female   DOB: Apr 19, 1945, 73 y.o.   [name]: [id number]    Advanced Heart Failure Rounding Note   Subjective:    She is alert this morning, not confused.  No dyspnea.  CVP 8, co-ox 81%.  Feeling better. SBP in 160s. Now on IV steroids.   Initial swan numbers 3/1 RA = 3 PA = 42/18 (26) PCW = 10 Fick cardiac output/index = 7.4/3.4 PVR = 2.1 WU SVR = 807  Echo EF 25-30% with global HK worse from mid-ventricle to apex. RV moderately down. (Echo 1/19 EF 55-60%)  CT chest w/o contrast: No evidence for ILD, patchy ground glass infiltrates noted bilaterally, PNA vs edema.   Objective:   Weight Range:  Vital Signs:   Temp:  [98 F (36.7 C)-98.5 F (36.9 C)] 98 F (36.7 C) (03/04 0400) Pulse Rate:  [69-102] 85 (03/04 0700) Resp:  [11-25] 20 (03/04 0700) BP: (131-193)/(51-89) 169/68 (03/04 0700) SpO2:  [90 %-100 %] 97 % (03/04 0700) Weight:  [117.5 kg] 117.5 kg (03/04 0500) Last BM Date: 03/25/18  Weight change: Filed Weights   04/02/18 0500 04/04/18 0500 04/05/18 0500  Weight: 126.6 kg 116.8 kg 117.5 kg    Intake/Output:   Intake/Output Summary (Last 24 hours) at 04/05/2018 0741 Last data filed at 04/05/2018 0500 Gross per 24 hour  Intake 865.1 ml  Output 1025 ml  Net -159.9 ml     Physical Exam: CVP 8  General: NAD Neck: No JVD, no thyromegaly or thyroid nodule.  Lungs: Decreased at bases.  CV: Nondisplaced PMI.  Heart regular S1/S2, no S3/S4, no murmur.  No peripheral edema.   Abdomen: Soft, nontender, no hepatosplenomegaly, no distention.  Skin: Intact without lesions or rashes.  Neurologic: Alert and oriented x 3.  Psych: Normal affect. Extremities: No clubbing or cyanosis.  HEENT: Normal.   Telemetry: SR 90s (personally reviewed)   Labs: Basic Metabolic Panel: Recent Labs  Lab 04/01/18 1539  04/01/18 1810 04/02/18 0324 04/03/18 0444 04/04/18 0413 04/05/18 0321  NA 136   < > 135 139 144 147* 143  K 3.5   < > 3.5 3.1* 3.9  3.2* 4.5  CL 83*  --  85* 91* 101 104 104  CO2 38*  --  41* 37* 30 33* 31  GLUCOSE 275*  --  280* 115* 182* 143* 286*  BUN 90*  --  92* 87* 48* 31* 32*  CREATININE 2.94*  --  2.88* 2.53* 1.65* 1.27* 1.36*  CALCIUM 8.9  --  8.9 8.8* 8.5* 8.9 9.2  MG 2.3  --   --  2.3 2.0 2.1 2.0  PHOS 5.2*  --   --  2.1* 2.4* 1.8* 2.4*   < > = values in this interval not displayed.    Liver Function Tests: No results for input(s): AST, ALT, ALKPHOS, BILITOT, PROT, ALBUMIN in the last 168 hours. No results for input(s): LIPASE, AMYLASE in the last 168 hours. No results for input(s): AMMONIA in the last 168 hours.  CBC: Recent Labs  Lab 04/01/18 1539 04/01/18 1648 04/02/18 0324 04/03/18 0444 04/04/18 0413 04/05/18 0321  WBC 14.9*  --  18.1* 15.9* 13.8* 14.3*  NEUTROABS 12.3*  --   --  11.8* 10.5* 12.6*  HGB 13.5 13.6 12.6 11.7* 11.0* 11.5*  HCT 44.1 40.0 38.7 36.7 36.7 37.6  MCV 96.3  --  94.9 97.9 99.5 98.2  PLT 117*  --  136* 137* 139* 178    Cardiac Enzymes: Recent  Labs  Lab 04/01/18 1539 04/02/18 0324 04/03/18 0444  TROPONINI 0.10* 0.13* 0.13*    BNP: BNP (last 3 results) Recent Labs    01/16/18 2150 03/25/18 1630  BNP 701.3* 1,480.1*    ProBNP (last 3 results) No results for input(s): PROBNP in the last 8760 hours.    Other results:  Imaging: Ct Chest Wo Contrast  Result Date: 04/03/2018 CLINICAL DATA:  Inpatient. Respiratory failure. Intubated. Clinical differential includes pneumonia, ARDS and amiodarone toxicity. EXAM: CT CHEST WITHOUT CONTRAST TECHNIQUE: Multidetector CT imaging of the chest was performed following the standard protocol without IV contrast. COMPARISON:  Chest radiograph from earlier today. FINDINGS: Cardiovascular: Mild-to-moderate cardiomegaly. No significant pericardial effusion/thickening. Three-vessel coronary atherosclerosis status post CABG. Right PICC terminates at the cavoatrial junction. Right internal jugular Swan-Ganz catheter terminates  within a segmental right lower lobe pulmonary artery branch. Atherosclerotic nonaneurysmal thoracic aorta. Dilated main pulmonary artery (3.6 cm diameter). Mediastinum/Nodes: Mildly heterogeneous thyroid gland with scattered tiny internal calcifications and no discrete thyroid nodules. Enteric tube terminates in the proximal stomach. Otherwise unremarkable esophagus. No axillary adenopathy. Mild right paratracheal adenopathy up to 1.2 cm (series 4/image 45). No discrete hilar adenopathy on this noncontrast scan. Lungs/Pleura: No pneumothorax. No pleural effusion. Endotracheal tube tip is 1.8 cm above the carina. Bandlike consolidation in bilateral dependent lower lobes with associated mild volume loss, compatible with mild atelectasis. No lung masses or significant pulmonary nodules in the aerated portions of the lungs. There is patchy ground-glass opacity and mild patchy foci of peribronchovascular consolidation throughout both lungs involving all lung lobes. Minimal scattered interlobular septal thickening. No significant regions of bronchiectasis, architectural distortion or frank honeycombing. Upper abdomen: Cholecystectomy. Musculoskeletal: No aggressive appearing focal osseous lesions. Intact sternotomy wires. Moderate thoracic spondylosis. IMPRESSION: 1. Right internal jugular Swan-Ganz catheter terminates within a segmental right lower lobe pulmonary artery branch and should be retracted approximately 5 cm. 2.  Endotracheal tube tip is 1.8 cm above the carina. 3. Mild-to-moderate cardiomegaly. 4. Dilated main pulmonary artery, suggesting pulmonary arterial hypertension. 5. Patchy ground-glass opacity and mild patchy foci of peribronchovascular consolidation throughout both lungs. These findings are nonspecific, with the differential including bronchopneumonia or pulmonary edema. No specific findings of interstitial lung disease. 6. Nonspecific mild right paratracheal adenopathy. No follow-up is required  unless as otherwise clinically warranted. This recommendation follows ACR consensus guidelines: Managing Incidental Findings on Thoracic CT: Mediastinal and Cardiovascular Findings. A White Paper of the ACR Incidental Findings Committee. J Am Coll Radiol. 2018; 15: 1087-1096. Aortic Atherosclerosis (ICD10-I70.0). These results will be called to the ordering clinician or representative by the Radiologist Assistant, and communication documented in the PACS or zVision Dashboard. Electronically Signed   By: Jason A Poff M.D.   On: 04/03/2018 13:22     Medications:     Scheduled Medications: . ALPRAZolam  0.5 mg Oral BID  . apixaban  5 mg Oral BID  . bisacodyl  5 mg Oral Daily  . budesonide (PULMICORT) nebulizer solution  0.25 mg Nebulization BID  . Chlorhexidine Gluconate Cloth  6 each Topical Daily  . clopidogrel  75 mg Oral Daily  . docusate sodium  100 mg Oral Daily  . DULoxetine  30 mg Oral Daily  . ezetimibe  10 mg Oral QHS  . insulin aspart  0-15 Units Subcutaneous Q4H  . ipratropium-albuterol  3 mL Nebulization TID  . losartan  12.5 mg Oral Daily  . methylPREDNISolone (SOLU-MEDROL) injection  40 mg Intravenous Q6H  . metoprolol succinate  50 mg Oral   BID  . multivitamin  1 tablet Oral Daily  . pantoprazole  40 mg Oral Daily  . rosuvastatin  40 mg Oral QHS  . sodium chloride flush  10-40 mL Intracatheter Q12H  . spironolactone  25 mg Oral Daily  . torsemide  40 mg Oral Daily  . vitamin B-12  1,000 mcg Oral Daily    Infusions: . sodium chloride 10 mL/hr at 04/04/18 0521  . ceFEPime (MAXIPIME) IV 1 g (04/04/18 1608)    PRN Medications: sodium chloride, hydrALAZINE, hydrocortisone cream, nitroGLYCERIN, ondansetron (ZOFRAN) IV, sodium chloride flush   Assessment:   TRESSY KUNZMAN is a 73 y.o. female with multiple medical problems including a hx of CAD s/p CABG and multiple PCI, HFpEF s/p CardioMEMS, AF, cerebrovascular disease, CKD, DM2, PAD, admitted 2/22 with acute  respiratory failure requiring mechanical intubation in the setting of URI with fever to 102 and cough.   Plan/Discussion:    1. Acute hypoxemic respiratory failure: Suspect combination of pulmonary edema and PNA.  Initially intubated, now extubated.  Reintubated on 2/29. CXR with diffuse bilateral airspace disease and PCT 3.08=>2.39 => 0.26=>0.13.  Extubated 3/2.  CT chest- mild patchy foci in both lungs. 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  - cefepime ongoing.  - Solumedrol IV.  - CVP 8, restart low dose torsemide today.  2. Shock: Suspect primarily septic shock, now resolved and off pressors.  She is now hypertensive.  3. ID: Suspect PNA.  Initial fever to 102.6 with high PCT.  Now afebrile.  Cultures so far negative.  - Empiric cefepime, day 5/7.  4. Acute systolic CHF: Echo with EF 25-30%, severe hypokinesis-akinesis of the mid-apical LV myocardium. Prior echo with normal EF.  ?Takotsubo event with stress of acute illness.  However, she has known extensive coronary disease as well. CVP 8 today with co-ox 81%.  - Increase Toprol XL back to home dose, 50 mg bid.  - Increase spironolactone to 25 daily.  - Add losartan 12.5 daily, if creatinine remains stable can likely transition to [place].  - Add low dose torsemide with CVP up to 8-9, start with 40 mg daily (she was on 100 mg bid at home).  5. AKI on CKD: stage 3, baseline 1.4-1.8. Creatinine 1.36 today.  5. CAD: Last cath 9/19 with severe native CAD and LIMA patent. Sequential SVG-PLOM/OM1 with 80% proximal stenosis, s/p DES. SVG to RCA chronically occluded. No CP.  Mild elevation in troponin with no trend, likely demand ischemia. Echo with newly low EF, may be Takotsubo.  - Continue statin.  - Plavix restarted 2/26 with plts rising.  - Would eventually like to do angiography with fall in EF but need to make sure creatinine remains stable.  6. Atrial fibrillation: Paroxysmal.  Maintaining  NSR.  Concern for amiodarone toxicity, off amiodarone.  - Continue apixaban 5 mg bid.   7. Thrombocytopenia: ?ITP triggered by acute infection.  Doubt TTP, no schistocytes/evidence for hemolysis.  HIT negative.  Got 2 units plts 2/25.  Given a dose of Solumedrol and 2 doses IVIG. Platelet count has now recovered.  8. Delerium: Resolved.   9. Deconditioning: PT  Loralie Champagne 04/05/2018 7:41 AM

## 2018-04-05 NOTE — Progress Notes (Signed)
PROGRESS NOTE    [name] [name]  VOZ:[id number] DOB: Dec 16, 1945 DOA: 03/25/2018 PCP: Derinda Late, MD  Outpatient Specialists:     Brief Narrative: 73 year old woman with history of asthma, OSA not on CPAP, chronic hypoxic respiratory failure on 2L at night, CAD s/p CABG and multiple PCI, HFpEF s/p CardioMEMS 01/16/2018, AF on Eliquis, cerebrovascular disease, HTN, CKD, DM2 and PAD.  Patient presented with 1 week history of productive cough and feeling poorly. She reported having intermittent emesis and post tussive emesis. Shewhas not able to take her medications because of her emesis.  Patient was noted to be hypoxic to the 70s when her daughter checked.   In the ED, pt febrile to 102.6 with SBP 170-209, HR 70s, O2 in the mid 80s. ECG showed sinus rhythm with LBBB-like IVCD. VBG 7.38/50. Labs included Cr 1.34 (at/improved from baseline), BNP 1480 (last 701 in 01/2018), troponin 0.05. Lactate 1.8. WBC 9.4.Flu panel negative.CXR showed cardiomegaly and bilateral infiltrates. She was intubated in the ED and started on IV aztreonam. IV furosemide was given, and NTG infusion was started for BP control.  04/05/2018: Patient seen.  Also discussed with the patient's nurse extensively.  The plan as per the patient and patient's nurse is to transfer patient to progressive unit.  No chest pain, no shortness of breath, no fever or chills.  Patient seems to have improved significantly.   Assessment & Plan:   Active Problems:   Acute respiratory failure with hypoxia (HCC)   Thrombocytopenia (HCC)   Community acquired pneumonia   Acute on chronic congestive heart failure (HCC)   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   Amiodarone pulmonary toxicity   Physical deconditioning  Acute hypoxemic respiratory failure in setting of diffuse pulmonary infiltrates:  -Likely multifactorial. -Possibility of amiodarone toxicity also entertained. -Patient is currently on steroids. -Pulmonary input is highly  appreciated. -As already planned, patient will be transferred to a progressive bed. -Continue supplemental oxygen. -Further management will depend on hospital course. -Note patient was successfully extubated 04/03/2018.    Acute HFrEF; felt to have element of Takotsubo physiology:  LVEF 25%.  Beta-blockade twice a day  Cardiology resuming torsemide  Spironolactone increasing 25 daily  Losartan added  Will eventually need repeat echocardiogram   CAD s/p CABG  Continuing aspirin and Plavix  Cardiology considering left heart cath but renal function barrier to this   A Fib complicated by QTc prolongation :  572msec 04/01/2018 Plan Holding amiodarone given concern about amiodarone toxicity  Apixaban per cardiology  Low-dose Lopressor   AKI on CKD -  Renal US 04/01/18 without hydro. -Renal function had improved -Continue to monitor  Thrombocytopenia:   Suspected ITP  (for ITP - > Received 1 unit of platelets on 03/28/2018 and IVIG 1 mg/kg X 2 doses 03/28/2018 and 03/29/2018) -04/05/2018: Platelet count is 178 today.   DM   -Glycemic control exacerbated by steroids -Continue to optimize.  Acute encephalopathy with underlying anxiety/Depression: Patient was on wellbutrin, cymbalta, gabapentin, xanax, vicodin, robaxin, xanax at home Optimize use of mind altering medications.  UTI: Complete that course of antibiotics.  DVT prophylaxis: Apixaban 5 Mg p.o. twice daily Code Status: Full code Family Communication:  Disposition Plan: This will depend on hospital course.   Consultants:   Cardiology  Pulmonary  Procedures:   Status post intubation and extubation.    Antimicrobials:   Completed course of antibiotics.   Subjective: No new complaints. Shortness of breath has improved significantly. No coughing. No chest pain.  Objective: Vitals:  04/05/18 0700 04/05/18 0800 04/05/18 0824 04/05/18 0855  BP: (!) 169/68 (!) 141/54    Pulse: 85 76    Resp: 20 14     Temp:    98.2 F (36.8 C)  TempSrc:    Oral  SpO2: 100% 95% 100%   Weight:      Height:        Intake/Output Summary (Last 24 hours) at 04/05/2018 1222 Last data filed at 04/05/2018 0500 Gross per 24 hour  Intake 388.8 ml  Output 750 ml  Net -361.2 ml   Filed Weights   04/02/18 0500 04/04/18 0500 04/05/18 0500  Weight: 126.6 kg 116.8 kg 117.5 kg    Examination:  General exam: Appears calm and comfortable.  Morbidly obese. Respiratory system: Decreased air entry globally.  Cardiovascular system: S1 & S2. Gastrointestinal system: Abdomen is obese, soft and nontender.  Organs are difficult to assess.   Central nervous system: Alert and oriented. No focal neurological deficits. Extremities: Mild leg edema  Data Reviewed: I have personally reviewed following labs and imaging studies  CBC: Recent Labs  Lab 04/01/18 1539 04/01/18 1648 04/02/18 0324 04/03/18 0444 04/04/18 0413 04/05/18 0321  WBC 14.9*  --  18.1* 15.9* 13.8* 14.3*  NEUTROABS 12.3*  --   --  11.8* 10.5* 12.6*  HGB 13.5 13.6 12.6 11.7* 11.0* 11.5*  HCT 44.1 40.0 38.7 36.7 36.7 37.6  MCV 96.3  --  94.9 97.9 99.5 98.2  PLT 117*  --  136* 137* 139* 638   Basic Metabolic Panel: Recent Labs  Lab 04/01/18 1539  04/01/18 1810 04/02/18 0324 04/03/18 0444 04/04/18 0413 04/05/18 0321  NA 136   < > 135 139 144 147* 143  K 3.5   < > 3.5 3.1* 3.9 3.2* 4.5  CL 83*  --  85* 91* 101 104 104  CO2 38*  --  41* 37* 30 33* 31  GLUCOSE 275*  --  280* 115* 182* 143* 286*  BUN 90*  --  92* 87* 48* 31* 32*  CREATININE 2.94*  --  2.88* 2.53* 1.65* 1.27* 1.36*  CALCIUM 8.9  --  8.9 8.8* 8.5* 8.9 9.2  MG 2.3  --   --  2.3 2.0 2.1 2.0  PHOS 5.2*  --   --  2.1* 2.4* 1.8* 2.4*   < > = values in this interval not displayed.   GFR: Estimated Creatinine Clearance: 46.8 mL/min (A) (by C-G formula based on SCr of 1.36 mg/dL (H)). Liver Function Tests: No results for input(s): AST, ALT, ALKPHOS, BILITOT, PROT, ALBUMIN in the  last 168 hours. No results for input(s): LIPASE, AMYLASE in the last 168 hours. No results for input(s): AMMONIA in the last 168 hours. Coagulation Profile: Recent Labs  Lab 04/03/18 0444  INR 1.3*   Cardiac Enzymes: Recent Labs  Lab 04/01/18 1539 04/02/18 0324 04/03/18 0444  TROPONINI 0.10* 0.13* 0.13*   BNP (last 3 results) No results for input(s): PROBNP in the last 8760 hours. HbA1C: No results for input(s): HGBA1C in the last 72 hours. CBG: Recent Labs  Lab 04/04/18 1639 04/04/18 2018 04/04/18 2348 04/05/18 0404 04/05/18 0852  GLUCAP 215* 253* 250* 254* 267*   Lipid Profile: No results for input(s): CHOL, HDL, LDLCALC, TRIG, CHOLHDL, LDLDIRECT in the last 72 hours. Thyroid Function Tests: No results for input(s): TSH, T4TOTAL, FREET4, T3FREE, THYROIDAB in the last 72 hours. Anemia Panel: No results for input(s): VITAMINB12, FOLATE, FERRITIN, TIBC, IRON, RETICCTPCT in the last 72 hours. Urine  analysis:    Component Value Date/Time   COLORURINE YELLOW 03/25/2018 1820   APPEARANCEUR CLEAR 03/25/2018 1820   LABSPEC 1.017 03/25/2018 1820   PHURINE 5.0 03/25/2018 1820   GLUCOSEU 50 (A) 03/25/2018 1820   HGBUR MODERATE (A) 03/25/2018 1820   BILIRUBINUR NEGATIVE 03/25/2018 1820   KETONESUR 20 (A) 03/25/2018 1820   PROTEINUR >=300 (A) 03/25/2018 1820   UROBILINOGEN 1.0 04/16/2011 0954   NITRITE NEGATIVE 03/25/2018 1820   LEUKOCYTESUR NEGATIVE 03/25/2018 1820   Sepsis Labs: @LABRCNTIP (procalcitonin:4,lacticidven:4)  ) Recent Results (from the past 240 hour(s))  Culture, respiratory (non-expectorated)     Status: None   Collection Time: 03/27/18  8:46 AM  Result Value Ref Range Status   Specimen Description TRACHEAL ASPIRATE  Final   Special Requests NONE  Final   Gram Stain   Final    ABUNDANT WBC PRESENT, PREDOMINANTLY PMN NO ORGANISMS SEEN    Culture   Final    FEW Consistent with normal respiratory flora. Performed at [facility], [place]  [address], [place], [place] [address]    Report Status 03/29/2018 FINAL  Final  Culture, blood (Routine X 2) w Reflex to ID Panel     Status: None (Preliminary result)   Collection Time: 04/01/18  4:30 PM  Result Value Ref Range Status   Specimen Description BLOOD SITE NOT SPECIFIED  Final   Special Requests   Final    BOTTLES DRAWN AEROBIC AND ANAEROBIC Blood Culture adequate volume   Culture   Final    NO GROWTH 4 DAYS Performed at [facility], [place] [address], [place], [place] [address]    Report Status PENDING  Incomplete  Culture, blood (Routine X 2) w Reflex to ID Panel     Status: None (Preliminary result)   Collection Time: 04/01/18  4:41 PM  Result Value Ref Range Status   Specimen Description BLOOD LEFT WRIST  Final   Special Requests AEROBIC BOTTLE ONLY Blood Culture adequate volume  Final   Culture   Final    NO GROWTH 4 DAYS Performed at [facility], [place] [address] [place], [place] [address]    Report Status PENDING  Incomplete  Culture, Urine     Status: None   Collection Time: 04/01/18  5:05 PM  Result Value Ref Range Status   Specimen Description URINE, RANDOM  Final   Special Requests Immunocompromised  Final   Culture   Final    NO GROWTH Performed at [facility], [place] [address] [place], [place] [address]    Report Status 04/02/2018 FINAL  Final  Culture, respiratory (non-expectorated)     Status: None   Collection Time: 04/01/18  5:05 PM  Result Value Ref Range Status   Specimen Description TRACHEAL ASPIRATE  Final   Special Requests Immunocompromised  Final   Gram Stain   Final    RARE WBC PRESENT, PREDOMINANTLY MONONUCLEAR NO ORGANISMS SEEN    Culture   Final    NO GROWTH 2 DAYS Performed at [facility], [place] [address], [place], [place] [address]    Report Status 04/03/2018 FINAL  Final         Radiology Studies: Ct Chest Wo Contrast  Result Date: 04/03/2018 CLINICAL DATA:  Inpatient. Respiratory failure. Intubated.  Clinical differential includes pneumonia, ARDS and amiodarone toxicity. EXAM: CT CHEST WITHOUT CONTRAST TECHNIQUE: Multidetector CT imaging of the chest was performed following the standard protocol without IV contrast. COMPARISON:  Chest  radiograph from earlier today. FINDINGS: Cardiovascular: Mild-to-moderate cardiomegaly. No significant pericardial effusion/thickening. Three-vessel coronary atherosclerosis status post CABG. Right PICC terminates at the cavoatrial junction. Right internal jugular Swan-Ganz catheter terminates within a segmental right lower lobe pulmonary artery branch. Atherosclerotic nonaneurysmal thoracic aorta. Dilated main pulmonary artery (3.6 cm diameter). Mediastinum/Nodes: Mildly heterogeneous thyroid gland with scattered tiny internal calcifications and no discrete thyroid nodules. Enteric tube terminates in the proximal stomach. Otherwise unremarkable esophagus. No axillary adenopathy. Mild right paratracheal adenopathy up to 1.2 cm (series 4/image 45). No discrete hilar adenopathy on this noncontrast scan. Lungs/Pleura: No pneumothorax. No pleural effusion. Endotracheal tube tip is 1.8 cm above the carina. Bandlike consolidation in bilateral dependent lower lobes with associated mild volume loss, compatible with mild atelectasis. No lung masses or significant pulmonary nodules in the aerated portions of the lungs. There is patchy ground-glass opacity and mild patchy foci of peribronchovascular consolidation throughout both lungs involving all lung lobes. Minimal scattered interlobular septal thickening. No significant regions of bronchiectasis, architectural distortion or frank honeycombing. Upper abdomen: Cholecystectomy. Musculoskeletal: No aggressive appearing focal osseous lesions. Intact sternotomy wires. Moderate thoracic spondylosis. IMPRESSION: 1. Right internal jugular Swan-Ganz catheter terminates within a segmental right lower lobe pulmonary artery branch and should be  retracted approximately 5 cm. 2.  Endotracheal tube tip is 1.8 cm above the carina. 3. Mild-to-moderate cardiomegaly. 4. Dilated main pulmonary artery, suggesting pulmonary arterial hypertension. 5. Patchy ground-glass opacity and mild patchy foci of peribronchovascular consolidation throughout both lungs. These findings are nonspecific, with the differential including bronchopneumonia or pulmonary edema. No specific findings of interstitial lung disease. 6. Nonspecific mild right paratracheal adenopathy. No follow-up is required unless as otherwise clinically warranted. This recommendation follows ACR consensus guidelines: Managing Incidental Findings on Thoracic CT: Mediastinal and Cardiovascular Findings. A White Paper of the ACR Incidental Findings Committee. J Am Coll Radiol. 2018; 15: 0350-0938. Aortic Atherosclerosis (ICD10-I70.0). These results will be called to the ordering clinician or representative by the Radiologist Assistant, and communication documented in the PACS or zVision Dashboard. Electronically Signed   By: Ilona Sorrel M.D.   On: 04/03/2018 13:22   Dg Chest Port 1 View  Result Date: 04/05/2018 CLINICAL DATA:  Follow-up pulmonary edema EXAM: PORTABLE CHEST 1 VIEW COMPARISON:  04/03/2018 FINDINGS: Cardiac shadow is mildly enlarged but stable. Cardio Mems device is again noted and stable. Endotracheal tube and nasogastric catheter have been removed in the interval. Right-sided PICC line is noted at the cavoatrial junction. Postsurgical changes are again seen and stable. Mild interstitial edema is seen without focal confluent infiltrate. IMPRESSION: Mild interstitial edema without focal infiltrate. PICC line in satisfactory position. Electronically Signed   By: Inez Catalina M.D.   On: 04/05/2018 08:45        Scheduled Meds: . ALPRAZolam  0.5 mg Oral BID  . apixaban  5 mg Oral BID  . bisacodyl  5 mg Oral Daily  . budesonide (PULMICORT) nebulizer solution  0.25 mg Nebulization BID  .  buPROPion  150 mg Oral Daily  . Chlorhexidine Gluconate Cloth  6 each Topical Daily  . clopidogrel  75 mg Oral Daily  . docusate sodium  100 mg Oral Daily  . DULoxetine  30 mg Oral Daily  . ezetimibe  10 mg Oral QHS  . gabapentin  1,200 mg Oral QHS  . gabapentin  600 mg Oral BID  . insulin aspart  0-20 Units Subcutaneous TID WC  . insulin aspart  0-5 Units Subcutaneous QHS  . insulin aspart  6 Units Subcutaneous TID WC  . ipratropium-albuterol  3 mL Nebulization TID  . losartan  12.5 mg Oral Daily  . methylPREDNISolone (SOLU-MEDROL) injection  40 mg Intravenous Q6H  . metoprolol succinate  50 mg Oral BID  . multivitamin  1 tablet Oral Daily  . pantoprazole  40 mg Oral Daily  . rosuvastatin  40 mg Oral QHS  . sodium chloride flush  10-40 mL Intracatheter Q12H  . spironolactone  25 mg Oral Daily  . torsemide  40 mg Oral Daily  . vitamin B-12  1,000 mcg Oral Daily   Continuous Infusions: . sodium chloride 10 mL/hr at 04/04/18 0521  . ceFEPime (MAXIPIME) IV 1 g (04/04/18 1608)     LOS: 11 days    Time spent: 35 minutes   Dana Allan, MD  [facility] Pager #: [phone] 7PM-7AM contact night coverage as above

## 2018-04-05 NOTE — Progress Notes (Signed)
Physical Therapy Treatment Patient Details Name: [name] [name] [name]: [id number] DOB: Feb 15, 1945 Today's Date: 04/05/2018    History of Present Illness [name] [name] is a 73 year old woman with history of asthma, OSA not on CPAP, chronic hypoxic respiratory failure on 2L at night, CAD s/p CABG and multiple PCI, HFpEF s/p CardioMEMS, AF on Eliquis, cerebrovascular disease, HTN, CKD, DM2, PAD presenting with cough found in the ED to have fever, hypertensive urgency, and acute on chronic hypoxic respiratory failure. intubated 2/22-2/25. Re-intubated 2/29 and extubated on 3/2.    PT Comments    Pt making steady progress.    Follow Up Recommendations  SNF     Equipment Recommendations  None recommended by PT    Recommendations for Other Services       Precautions / Restrictions Precautions Precautions: Fall Restrictions Weight Bearing Restrictions: No    Mobility  Bed Mobility Overal bed mobility: Needs Assistance Bed Mobility: Supine to Sit     Supine to sit: Min assist;HOB elevated     General bed mobility comments: Assist to elevate trunk into sitting.   Transfers Overall transfer level: Needs assistance Equipment used: Rolling walker (2 wheeled) Transfers: Sit to/from Stand Sit to Stand: Min assist;+2 physical assistance         General transfer comment: Assist to bring hips up and for balance  Ambulation/Gait Ambulation/Gait assistance: +2 physical assistance;Min assist Gait Distance (Feet): 2 Feet Assistive device: 4-wheeled walker Gait Pattern/deviations: Step-to pattern;Decreased step length - right;Decreased step length - left;Shuffle;Trunk flexed Gait velocity: decr Gait velocity interpretation: <1.31 ft/sec, indicative of household ambulator General Gait Details: Assist for balance and support. Stepped from bed over to recliner   Stairs             Wheelchair Mobility    Modified Rankin (Stroke Patients Only)       Balance Overall balance  assessment: Needs assistance Sitting-balance support: Feet unsupported;No upper extremity supported Sitting balance-Leahy Scale: Fair     Standing balance support: During functional activity;Bilateral upper extremity supported Standing balance-Leahy Scale: Poor Standing balance comment: rollator and min assist for static standing. Stood x 90 sec for pericare.                            Cognition Arousal/Alertness: Awake/alert Behavior During Therapy: WFL for tasks assessed/performed Overall Cognitive Status: Impaired/Different from baseline Area of Impairment: Awareness;Safety/judgement;Following commands;Attention;Orientation;Memory                 Orientation Level: Disoriented to;Time;Place;Situation Current Attention Level: Selective Memory: Decreased short-term memory Following Commands: Follows one step commands consistently Safety/Judgement: Decreased awareness of safety;Decreased awareness of deficits Awareness: Intellectual   General Comments: Lucidity fluctuates throughout the session      Exercises      General Comments        Pertinent Vitals/Pain Faces Pain Scale: No hurt    Home Living                      Prior Function            PT Goals (current goals can now be found in the care plan section) Acute Rehab PT Goals Patient Stated Goal: none stated Progress towards PT goals: Progressing toward goals    Frequency    Min 3X/week      PT Plan Current plan remains appropriate    Co-evaluation  AM-PAC PT "6 Clicks" Mobility   Outcome Measure  Help needed turning from your back to your side while in a flat bed without using bedrails?: A Little Help needed moving from lying on your back to sitting on the side of a flat bed without using bedrails?: A Little Help needed moving to and from a bed to a chair (including a wheelchair)?: A Little Help needed standing up from a chair using your arms (e.g.,  wheelchair or bedside chair)?: A Little Help needed to walk in hospital room?: Total Help needed climbing 3-5 steps with a railing? : Total 6 Click Score: 14    End of Session Equipment Utilized During Treatment: Gait belt Activity Tolerance: Patient tolerated treatment well Patient left: with call bell/phone within reach;in chair;with chair alarm set Nurse Communication: Mobility status(nurse assisted with mobility) PT Visit Diagnosis: Unsteadiness on feet (R26.81);Muscle weakness (generalized) (M62.81);Other abnormalities of gait and mobility (R26.89)     Time: 4540-9811 PT Time Calculation (min) (ACUTE ONLY): 14 min  Charges:  $Therapeutic Activity: 8-22 mins                     [place] Pager [phone] Office [place] 04/05/2018, 1:49 PM

## 2018-04-05 NOTE — Progress Notes (Addendum)
NAME:  [name] [name], [name]:  [id number], DOB:  26-Sep-1945, LOS: 36 ADMISSION DATE:  03/25/2018, CONSULTATION DATE:  03/25/2018 REFERRING MD:  ED physician, CHIEF COMPLAINT:  Acute hypoxic respiratory failure   Brief History   73 year old woman with history of asthma, OSA not on CPAP, chronic hypoxic respiratory failure on 2L at night, CAD s/p CABG and multiple PCI, HFpEF s/p CardioMEMS 01/16/2018, AF on Eliquis, cerebrovascular disease, HTN, CKD, DM2, PAD presenting with cough, feeling poorly for the past week with cough productive of sputum. She has been having intermittent emesis and post tussive emesis. She has not been able to take her medications because of her emesis.She was noted to be hypoxic to the 70s this afternoon when her daughter checked.   In the ED, pt febrile to 102.6 with SBP 170-209, HR 70s, O2 in the mid 80s. ECG showed sinus rhythm with LBBB-like IVCD. VBG 7.38/50. Labs included Cr 1.34 (at/improved from baseline), BNP 1480 (last 701 in 01/2018), troponin 0.05. Lactate 1.8. WBC 9.4. Flu panel negative. CXR showed cardiomegaly and bilateral infiltrates. She was intubated in the ED and started on IV aztreonam. IV furosemide was given, and NTG infusion was started for BP control.    Past Medical History  asthma, OSA not on CPAP, chronic hypoxic respiratory failure on 2L at night, CAD s/p CABG and multiple PCI, HFpEF s/p CardioMEMS, AF on Eliquis, cerebrovascular disease, HTN, CKD, DM2, PAD  Significant Hospital Events   2/22 > admit, intubated. Urine with e colii 2/23- echo EF 25-30% with global HK worse from mid-ventricle to apex. RV moderately down. (Echo 1/19 EF 55-60%) 2/25- NAEO. Improved nausea with phenergan. Transferrred and ccm signed off. Plat < 5k). HITT - negtive 2/26 - CT head - nil acute (for ITP - > Received 1 unit of platelets on 03/28/2018 and IVIG 1 mg/kg X 2 doses 03/28/2018 and 03/29/2018 2.27 - platelet upat 74K . Concerne is ITP (not TTP). Ongoing delirum but  improved. Cards recommendnig to limit sterods. Cardd ddx - acute IHD s-CHF v Takasubo. Maintained on amio for PAF. Rx with levaquin for CAP NPOS 2/28 extubated 2/29 re-intubated 3/3: intubated 03/25/2018 through 03/27/2018. Reintubated 04/01/2018. Etiology not entirely clear. Diffuse infiltrate with working diagnosis of pulmonary  edema vs infection vs inflammatory process. S/p treatment with antibiotics. Concern for amiodarone toxicity.  Started on Solu-Medrol overall she is improved.  Extubated 3/2 passed swallowing evaluation. 3/4: Now off from oxygen.  Clinically improved.  Delirium improving.  Glycemic control has been an issue overnight Consults:  Cardiology 2/22 Hematology  Procedures:  Intubated 2/22>2/24, 2/29 >> PA cath 3/1 >>out  Significant Diagnostic Tests:  CXR 2/22 > Cardiomegaly. Worsening bilateral airspace disease CXR 2/24> pulmonary edema pattern CXR 2/25> worsening RUL opacity  Autoimmune profile sent 3/1: ANA with reflex: Positive, ANCA titers negative Rheumatoid factor negative Cyclic citral antibodies: Negative  Micro Data:  BCx x 2 2/22 >> no growth 24 hours MRSA pcr 2/23 - neg  Resp cx 2/22 >>  UCx 2/22 >> e coli  Influenza PCR 2/22 >> neg 2/29 blood >>> 2/29 URine > neg 2/29 > sputum neg  Antimicrobials:  Aztreonam 2/22 Levaquin 2/23 Vancomycin 2/23 Cefepime 2/23>>  Interim history/subjective:  No distress.  Now off oxygen  Objective   Blood pressure (Abnormal) 141/54, pulse 76, temperature 98 F (36.7 C), temperature source Oral, resp. rate 14, height '5\' 2"'  (1.575 m), weight 117.5 kg, SpO2 100 %. CVP:  [3 mmHg] 3 mmHg  Intake/Output Summary (Last 24 hours) at 04/05/2018 0836 Last data filed at 04/05/2018 0500 Gross per 24 hour  Intake 807.23 ml  Output 1025 ml  Net -217.77 ml   Filed Weights   04/02/18 0500 04/04/18 0500 04/05/18 0500  Weight: 126.6 kg 116.8 kg 117.5 kg   General: This is a 73 year old female she is sitting upright in  bed and in no acute distress this morning HEENT: Normocephalic atraumatic no jugular venous distention mucous membranes are moist Cardiac: Regular rate and rhythm Pulmonary: Faint expiratory wheeze bibasilar otherwise no accessory use and work of breathing improved Abdomen: Soft, not tender.  No organomegaly Extremities: Warm and dry Neuro: Awake oriented no focal deficits less confused today GU: Clear yellow    Septic and cardiogenic shock resolved 3/2 Assessment & Plan:   Acute hypoxemic respiratory failure in setting of diffuse pulmonary infiltrates: Etiology not entirely clear, question hypersensitivity pneumonitis possibly from amiodarone versus ARDS, vs flash edema in setting of takotsubo CM Successfully extubated 3/2 Portable chest x-ray personally reviewed on 3/4: This demonstrates bilateral pulmonary infiltrates.  They have unchanged when comparing prior film, however clinically she is off oxygen Thus far autoimmune panel not impressive Plan Continue pulse oximetry  Aspiration precautions  Day #5 of 7 cefepime  Continuing methylprednisone 40 mg every 6 hours for another 24 hours then will initiate taper  Repeat chest x-ray again on 3/6 Follow-up additional pending autoimmune panel  Acute HFrEF; felt to have element of Takotsubo physiology:  LVEF 25%. She has extensive cardiac history, which is the likely culprit in the setting of critical illness, however, HF mentions some concern of Takotsubo. Plan Beta-blockade twice a day  Cardiology resuming torsemide  Spironolactone increasing 25 daily  Losartan added  Will eventually need repeat echocardiogram   CAD s/p CABG  Plan Continuing aspirin and Plavix  Cardiology considering left heart cath but renal function barrier to this   A Fib complicated by QTc prolongation :  522mec 04/01/2018 Plan Holding amiodarone given concern about amiodarone toxicity  Apixaban per cardiology  Low-dose Lopressor   AKI on CKD -  Renal [place] 04/01/18 without hydro. -Renal function had improved, seems to have plateaued Plan Avoid hypotension Repeat chemistry a.m. Daily assessment for diuretic  Intermittent fluid and electrolyte imbalance: Plan Trend chemistries  Thrombocytopenia:  Suspected ITP  (for ITP - > Received 1 unit of platelets on 03/28/2018 and IVIG 1 mg/kg X 2 doses 03/28/2018 and 03/29/2018) -Continues to improve rest Plan Trend CBC  DM   -Glycemic control exacerbated by steroids Plan Resistant sliding scale with meal coverage  Acute encephalopathy with underlying anxiety/Depression on wellbutrin, cymbalta, gabapentin, xanax, vicodin, robaxin, xanax at home Plan Added back Cymbalta and Xanax Also adding back Wellbutrin and gabapentin Holding Vicodin and Robaxin    Best practice:  Diet: NPO TG held for nausea.  Pain/Anxiety/Delirium protocol (if indicated): fent gtt/PRN, delirium precautions VAP protocol (if indicated): ordered DVT prophylaxis: SCDs GI prophylaxis: PPI Glucose control: SSI Mobility: as tolerated  Code Status: FULL Family Communication: No family bedside 3/2 Disposition: Clinically she looks better.  She can move to the progressive care unit from a critical care standpoint.  We will continue her current dose Solu-Medrol with plan to change this to prednisone tomorrow 3/5  PErick ColaceACNP-BC LWarrenPager # [phone] # [phone] no answer  Attending Note:  73year old female with presumed amiodarone lung toxicity and respiratory failure.  ESR is very elevated.  One  exam, appears much better this AM and much more comfortable with rales but off O2.  No events overnight.  I reviewed CXR myself, infiltrate noted.  Discussed with PCCM-NP and cardiology.  Respiratory failure:  - Monitor for airway protection  Hypoxemia:  - Titrate O2 for sat of 88-92%  - May need an ambulatory desat study as she ambulates  Deconditioning:  - PT/OT  -  OOB  Amiodarone lung:  - Continue solumedrol today  - Transition to prednisone in AM with a slow taper  Heart failure:  - Advanced heart failure team following  Disposition:  - Transfer to progressive care  - Transfer to [facility] will follow for amiodarone lung as consult  Patient seen and examined, agree with above note.  I dictated the care and orders written for this patient under my direction.  Rush Farmer, [place]

## 2018-04-05 NOTE — Progress Notes (Signed)
Occupational Therapy Re- Evaluation Patient Details Name: [name] [name] [name]: [id number] DOB: 1945-03-09 Today's Date: 04/05/2018    History of Present Illness [name] [name] is a 73 year old woman with history of asthma, OSA not on CPAP, chronic hypoxic respiratory failure on 2L at night, CAD s/p CABG and multiple PCI, HFpEF s/p CardioMEMS, AF on Eliquis, cerebrovascular disease, HTN, CKD, DM2, PAD presenting with cough found in the ED to have fever, hypertensive urgency, and acute on chronic hypoxic respiratory failure. intubated 2/22-2/25. Re-intubated 2/29 and extubated on 3/2.   Clinical Impression   Pt re-evaluated post-extubation. Pt is currently mod A for LB ADL, min to mod A +2 for transfers, and continues to demonstrate deficits in cognition, balance, activity tolerance. Pt will benefit from skilled OT in the acute setting and afterwards at the SNF level as Pt has to be able to get to 2nd story of home (14 steps). Next session to focus on standing activity tolerance as well as energy conservation education.     Follow Up Recommendations  SNF;Supervision/Assistance - 24 hour    Equipment Recommendations  Tub/shower seat;Other (comment)(defer to next venue)    Recommendations for Other Services       Precautions / Restrictions Precautions Precautions: Fall Precaution Comments: check SpO2 Restrictions Weight Bearing Restrictions: No      Mobility Bed Mobility Overal bed mobility: Needs Assistance Bed Mobility: Sit to Supine       Sit to supine: Min guard   General bed mobility comments: no physical assist needed  Transfers Overall transfer level: Needs assistance Equipment used: 1 person hand held assist Transfers: Sit to/from [facility] Sit to Stand: Min assist;+2 safety/equipment Stand pivot transfers: Mod assist;+2 safety/equipment       General transfer comment: min A for power up, rocking momentum used to initiate movement, assist for  balance    Balance Overall balance assessment: Needs assistance Sitting-balance support: Feet unsupported;No upper extremity supported Sitting balance-Leahy Scale: Fair     Standing balance support: Single extremity supported Standing balance-Leahy Scale: Poor Standing balance comment: dependent on at least one UE support during transfers                           ADL either performed or assessed with clinical judgement   ADL Overall ADL's : Needs assistance/impaired Eating/Feeding: Supervision/ safety   Grooming: Oral care;Wash/dry face;Set up   Upper Body Bathing: Set up;Cueing for sequencing   Lower Body Bathing: Moderate assistance;Sitting/lateral leans   Upper Body Dressing : Min guard;Cueing for compensatory techniques   Lower Body Dressing: Moderate assistance;With adaptive equipment   Toilet Transfer: Moderate assistance;Stand-pivot;BSC   Toileting- Clothing Manipulation and Hygiene: Moderate assistance       Functional mobility during ADLs: Minimal assistance(HHA, stand pivot transfer only) General ADL Comments:  Pt. completed oral care while seated in recliner. Pt. needed direct one-step commands while completing ADL tasks.       Vision Baseline Vision/History: Wears glasses Wears Glasses: Reading only Patient Visual Report: No change from baseline       Perception     Praxis      Pertinent Vitals/Pain Pain Assessment: No/denies pain     Hand Dominance Right   Extremity/Trunk Assessment Upper Extremity Assessment Upper Extremity Assessment: Generalized weakness   Lower Extremity Assessment Lower Extremity Assessment: Defer to PT evaluation   Cervical / Trunk Assessment Cervical / Trunk Assessment: Other exceptions Cervical / Trunk Exceptions: extra body habitus  Communication Communication Communication: No difficulties   Cognition Arousal/Alertness: Awake/alert Behavior During Therapy: WFL for tasks  assessed/performed Overall Cognitive Status: Impaired/Different from baseline Area of Impairment: Following commands;Awareness;Problem solving                     Memory: Decreased short-term memory Following Commands: Follows one step commands consistently Safety/Judgement: Decreased awareness of safety;Decreased awareness of deficits Awareness: Emergent Problem Solving: Requires verbal cues     General Comments  daughter, son in law present throughout session, VSS    Exercises     Shoulder Instructions      Home Living Family/patient expects to be discharged to:: Private residence Living Arrangements: Spouse/significant other Available Help at Discharge: Family;Available PRN/intermittently Type of Home: House Home Access: Level entry     Home Layout: Two level;Bed/bath upstairs(pt. reported having 14 stairs in her home to get to bedroom) Alternate Level Stairs-Number of Steps: 14 Alternate Level Stairs-Rails: Can reach both Bathroom Shower/Tub: Tub only;Walk-in shower(garden tub)   Bathroom Toilet: Standard Bathroom Accessibility: No   Home Equipment: Walker - 4 wheels   Additional Comments: Pt. reports husband is working on a stairlift, family willing to install a handheld shower and grab bars.      Prior Functioning/Environment Level of Independence: Needs assistance  Gait / Transfers Assistance Needed: very limited tolerance to gait, 5 steps before rest at baseline. Pt. also reporting household mobility with furniture walking ADL's / Homemaking Assistance Needed: PRN assistance from husband for LB dressing, fall in tub, does not perform IADL but misses cooking.             OT Problem List: Decreased strength;Decreased knowledge of use of DME or AE;Decreased activity tolerance;Decreased cognition;Cardiopulmonary status limiting activity;Impaired balance (sitting and/or standing);Decreased safety awareness      OT Treatment/Interventions: Self-care/ADL  training;Balance training;Therapeutic exercise;Neuromuscular education;Therapeutic activities;Energy conservation;Cognitive remediation/compensation;Manual therapy;Patient/family education;Visual/perceptual remediation/compensation;DME and/or AE instruction    OT Goals(Current goals can be found in the care plan section) Acute Rehab OT Goals Patient Stated Goal: be able to get back to cooking some OT Goal Formulation: With patient/family Time For Goal Achievement: 04/19/18 Potential to Achieve Goals: Good  OT Frequency: Min 2X/week   Barriers to D/C: Other (comment);Inaccessible home environment(2nd level bed/bath)          Co-evaluation              AM-PAC OT "6 Clicks" Daily Activity     Outcome Measure Help from another person eating meals?: A Little Help from another person taking care of personal grooming?: A Little Help from another person toileting, which includes using toliet, bedpan, or urinal?: A Lot Help from another person bathing (including washing, rinsing, drying)?: A Lot Help from another person to put on and taking off regular upper body clothing?: A Little Help from another person to put on and taking off regular lower body clothing?: A Lot 6 Click Score: 15   End of Session Nurse Communication: Mobility status;Precautions;Other (comment)(needs purewick replaced)  Activity Tolerance: Patient tolerated treatment well Patient left: in bed;with call bell/phone within reach;with family/visitor present  OT Visit Diagnosis: History of falling (Z91.81);Muscle weakness (generalized) (M62.81)                Time: 0102-7253 OT Time Calculation (min): 41 min Charges:  OT General Charges $OT Visit: 1 Visit OT Evaluation $OT Re-eval: 1 Re-eval OT Treatments $Self Care/Home Management : 8-22 mins $Therapeutic Activity: 8-22 mins  Hulda Humphrey OTR/L Acute Rehabilitation Services Pager:  [phone] Office: [place] 04/05/2018, 5:13 PM

## 2018-04-06 ENCOUNTER — Ambulatory Visit: Payer: Medicare Other | Admitting: Neurology

## 2018-04-06 DIAGNOSIS — Z794 Long term (current) use of insulin: Secondary | ICD-10-CM

## 2018-04-06 DIAGNOSIS — Z9989 Dependence on other enabling machines and devices: Secondary | ICD-10-CM

## 2018-04-06 DIAGNOSIS — E1169 Type 2 diabetes mellitus with other specified complication: Secondary | ICD-10-CM

## 2018-04-06 DIAGNOSIS — G4733 Obstructive sleep apnea (adult) (pediatric): Secondary | ICD-10-CM

## 2018-04-06 LAB — RENAL FUNCTION PANEL
Albumin: 2.3 g/dL — ABNORMAL LOW (ref 3.5–5.0)
Anion gap: 10 (ref 5–15)
BUN: 40 mg/dL — ABNORMAL HIGH (ref 8–23)
CO2: 31 mmol/L (ref 22–32)
Calcium: 8.7 mg/dL — ABNORMAL LOW (ref 8.9–10.3)
Chloride: 96 mmol/L — ABNORMAL LOW (ref 98–111)
Creatinine, Ser: 1.67 mg/dL — ABNORMAL HIGH (ref 0.44–1.00)
GFR calc Af Amer: 36 mL/min — ABNORMAL LOW (ref >60)
GFR calc non Af Amer: 31 mL/min — ABNORMAL LOW (ref >60)
Glucose, Bld: 354 mg/dL — ABNORMAL HIGH (ref 70–99)
Phosphorus: 4.3 mg/dL (ref 2.5–4.6)
Potassium: 3.9 mmol/L (ref 3.5–5.1)
Sodium: 137 mmol/L (ref 135–145)

## 2018-04-06 LAB — CBC WITH DIFFERENTIAL/PLATELET
Abs Immature Granulocytes: 0.2 10*3/uL — ABNORMAL HIGH (ref 0.00–0.07)
Basophils Absolute: 0 10*3/uL (ref 0.0–0.1)
Basophils Relative: 0 %
Eosinophils Absolute: 0 10*3/uL (ref 0.0–0.5)
Eosinophils Relative: 0 %
HCT: 36.8 % (ref 36.0–46.0)
Hemoglobin: 11.4 g/dL — ABNORMAL LOW (ref 12.0–15.0)
Immature Granulocytes: 1 %
Lymphocytes Relative: 4 %
Lymphs Abs: 0.7 10*3/uL (ref 0.7–4.0)
MCH: 29.8 pg (ref 26.0–34.0)
MCHC: 31 g/dL (ref 30.0–36.0)
MCV: 96.1 fL (ref 80.0–100.0)
Monocytes Absolute: 0.5 10*3/uL (ref 0.1–1.0)
Monocytes Relative: 3 %
Neutro Abs: 14.1 10*3/uL — ABNORMAL HIGH (ref 1.7–7.7)
Neutrophils Relative %: 92 %
Platelets: 188 10*3/uL (ref 150–400)
RBC: 3.83 MIL/uL — ABNORMAL LOW (ref 3.87–5.11)
RDW: 13.2 % (ref 11.5–15.5)
WBC: 15.5 10*3/uL — ABNORMAL HIGH (ref 4.0–10.5)
nRBC: 0 % (ref 0.0–0.2)

## 2018-04-06 LAB — COOXEMETRY PANEL
Carboxyhemoglobin: 1.3 % (ref 0.5–1.5)
Methemoglobin: 1.9 % — ABNORMAL HIGH (ref 0.0–1.5)
O2 Saturation: 66.2 %
Total hemoglobin: 12.2 g/dL (ref 12.0–16.0)

## 2018-04-06 LAB — ALLERGEN PROFILE WITH TOTAL IGE, RESPIRATORY-AREA 2
Alternaria Alternata IgE: 0.13 kU/L — AB
Aspergillus Fumigatus IgE: <0.1 kU/L
Bermuda Grass IgE: <0.1 kU/L
Cat Dander IgE: 2.1 kU/L — AB
Cedar, Mountain IgE: <0.1 kU/L
Cladosporium Herbarum IgE: 0.14 kU/L — AB
Cockroach, German IgE: <0.1 kU/L
Common Silver Birch IgE: <0.1 kU/L
Cottonwood IgE: <0.1 kU/L
D Farinae IgE: <0.1 kU/L
D Pteronyssinus IgE: <0.1 kU/L
Dog Dander IgE: 0.6 kU/L — AB
Elm, American IgE: <0.1 kU/L
IgE (Immunoglobulin E), Serum: 231 [IU]/mL (ref 6–495)
Johnson Grass IgE: <0.1 kU/L
Maple/Box Elder IgE: <0.1 kU/L
Mouse Urine IgE: <0.1 kU/L
Oak, White IgE: <0.1 kU/L
Pecan, Hickory IgE: <0.1 kU/L
Penicillium Chrysogen IgE: <0.1 kU/L
Pigweed, Rough IgE: <0.1 kU/L
Ragweed, Short IgE: <0.1 kU/L
Sheep Sorrel IgE Qn: <0.1 kU/L
Timothy Grass IgE: <0.1 kU/L
White Mulberry IgE: <0.1 kU/L

## 2018-04-06 LAB — GLUCOSE, CAPILLARY
Glucose-Capillary: 356 mg/dL — ABNORMAL HIGH (ref 70–99)
Glucose-Capillary: 265 mg/dL — ABNORMAL HIGH (ref 70–99)
Glucose-Capillary: 294 mg/dL — ABNORMAL HIGH (ref 70–99)
Glucose-Capillary: 350 mg/dL — ABNORMAL HIGH (ref 70–99)

## 2018-04-06 LAB — CULTURE, BLOOD (ROUTINE X 2)
Culture: NO GROWTH
Culture: NO GROWTH
Special Requests: ADEQUATE
Special Requests: ADEQUATE

## 2018-04-06 LAB — MAGNESIUM: Magnesium: 1.7 mg/dL (ref 1.7–2.4)

## 2018-04-06 MED ORDER — GLUCERNA SHAKE PO LIQD
237.0000 mL | Freq: Three times a day (TID) | ORAL | Status: DC
  Administered 2018-04-07 – 2018-04-09 (×9): 237 mL via ORAL
  Filled 2018-04-06: qty 237

## 2018-04-06 MED ORDER — INSULIN GLARGINE 100 UNIT/ML ~~LOC~~ SOLN
20.0000 [IU] | Freq: Every day | SUBCUTANEOUS | Status: DC
  Administered 2018-04-06: 20 [IU] via SUBCUTANEOUS
  Filled 2018-04-06 (×2): qty 0.2

## 2018-04-06 MED ORDER — PREDNISONE 20 MG PO TABS
60.0000 mg | ORAL_TABLET | Freq: Every day | ORAL | Status: DC
  Administered 2018-04-06 – 2018-04-07 (×2): 60 mg via ORAL
  Filled 2018-04-06 (×2): qty 3

## 2018-04-06 NOTE — Progress Notes (Signed)
Physical Therapy Treatment Patient Details Name: [name] [name] [name]: [id number] DOB: January 10, 1946 Today's Date: 04/06/2018    History of Present Illness [name] [name] is a 73 year old woman with history of asthma, OSA not on CPAP, chronic hypoxic respiratory failure on 2L at night, CAD s/p CABG and multiple PCI, HFpEF s/p CardioMEMS, AF on Eliquis, cerebrovascular disease, HTN, CKD, DM2, PAD presenting with cough found in the ED to have fever, hypertensive urgency, and acute on chronic hypoxic respiratory failure. intubated 2/22-2/25. Re-intubated 2/29 and extubated on 3/2.    PT Comments    Patient with poor dynamic balance standing tonight, posterior LOB x4 standing EOB and side stepping. Deferred ambulation tonight for safety. Will cont to follow. cognition improving.    Follow Up Recommendations  SNF     Equipment Recommendations  None recommended by PT    Recommendations for Other Services       Precautions / Restrictions Restrictions Weight Bearing Restrictions: No    Mobility  Bed Mobility Overal bed mobility: Needs Assistance Bed Mobility: Sit to Supine Rolling: Max assist   Supine to sit: Min assist;HOB elevated Sit to supine: Min guard   General bed mobility comments: no physical assist needed  Transfers Overall transfer level: Needs assistance Equipment used: 1 person hand held assist Transfers: Sit to/from [facility] Sit to Stand: Min assist;+2 physical assistance Stand pivot transfers: Min assist;+2 physical assistance       General transfer comment: standing and side stepping laps at EOB. multiple posterior lean and near premature sits noted. not steady tonight  Ambulation/Gait                 Stairs             Wheelchair Mobility    Modified Rankin (Stroke Patients Only)       Balance Overall balance assessment: Needs assistance Sitting-balance support: Feet unsupported;No upper extremity supported Sitting  balance-Leahy Scale: Fair                                      Cognition Arousal/Alertness: Awake/alert Behavior During Therapy: WFL for tasks assessed/performed Overall Cognitive Status: Impaired/Different from baseline Area of Impairment: Following commands;Awareness;Problem solving                 Orientation Level: Disoriented to;Time;Place;Situation Current Attention Level: Selective Memory: Decreased short-term memory Following Commands: Follows one step commands consistently Safety/Judgement: Decreased awareness of safety;Decreased awareness of deficits Awareness: Emergent Problem Solving: Requires verbal cues General Comments: Lucidity fluctuates throughout the session      Exercises      General Comments        Pertinent Vitals/Pain Pain Assessment: No/denies pain Faces Pain Scale: No hurt    Home Living                      Prior Function            PT Goals (current goals can now be found in the care plan section) Acute Rehab PT Goals Patient Stated Goal: be able to get back to cooking some PT Goal Formulation: With patient Time For Goal Achievement: 04/18/18 Potential to Achieve Goals: Fair Progress towards PT goals: Progressing toward goals    Frequency    Min 3X/week      PT Plan Current plan remains appropriate    Co-evaluation  AM-PAC PT "6 Clicks" Mobility   Outcome Measure  Help needed turning from your back to your side while in a flat bed without using bedrails?: A Little Help needed moving from lying on your back to sitting on the side of a flat bed without using bedrails?: A Little Help needed moving to and from a bed to a chair (including a wheelchair)?: A Little Help needed standing up from a chair using your arms (e.g., wheelchair or bedside chair)?: A Little Help needed to walk in hospital room?: Total Help needed climbing 3-5 steps with a railing? : Total 6 Click Score: 14     End of Session Equipment Utilized During Treatment: Gait belt Activity Tolerance: Patient tolerated treatment well Patient left: with call bell/phone within reach;in chair;with chair alarm set Nurse Communication: Mobility status PT Visit Diagnosis: Unsteadiness on feet (R26.81);Muscle weakness (generalized) (M62.81);Other abnormalities of gait and mobility (R26.89)     Time: 1594-5859 PT Time Calculation (min) (ACUTE ONLY): 15 min  Charges:  $Therapeutic Activity: 8-22 mins                     Reinaldo Berber, PT, DPT Acute Rehabilitation Services Pager: [phone] Office: [phone]     Reinaldo Berber 04/06/2018, 6:19 PM

## 2018-04-06 NOTE — Progress Notes (Signed)
Inpatient Diabetes Program Recommendations  AACE/ADA: New Consensus Statement on Inpatient Glycemic Control (2015)  Target Ranges:  Prepandial:   less than 140 mg/dL      Peak postprandial:   less than 180 mg/dL (1-2 hours)      Critically ill patients:  140 - 180 mg/dL   Lab Results  Component Value Date   GLUCAP 292 (H) 04/05/2018   HGBA1C 8.0 (H) 10/21/2017    Review of Glycemic Control Results for KELBY, LOTSPEICH ([name] [id number]) as of 04/06/2018 11:21  Ref. Range 04/05/2018 04:04 04/05/2018 08:52 04/05/2018 12:33 04/05/2018 17:11 04/05/2018 22:16  Glucose-Capillary Latest Ref Range: 70 - 99 mg/dL 254 (H) 267 (H) 284 (H) 274 (H) 292 (H)   Inpatient Diabetes Program Recommendations:   CBGs consistently >250. Please consider Lantus 10 units daily.   Thank you, Nani Gasser. Analisa Sledd, RN, MSN, CDE  Diabetes Coordinator Inpatient Glycemic Control Team Team Pager [phone] (8am-5pm) 04/06/2018 11:22 AM

## 2018-04-06 NOTE — Progress Notes (Signed)
Hematology follow-up  Chart has been reviewed.  Platelet count has been normal for the past 2 days.  Hematology will sign off at this point.  Please call us if there are any concerns from a hematology standpoint.  Mikey Bussing, DNP, AGPCNP-BC, AOCNP

## 2018-04-06 NOTE — Progress Notes (Addendum)
Nutrition Follow Up  DOCUMENTATION CODES:   Morbid obesity  INTERVENTION:    Glucerna Shake po TID, each supplement provides 220 kcal and 10 grams of protein  NEW NUTRITION DIAGNOSIS:   Increased nutrient needs related to acute illness as evidenced by estimated needs, ongoing  GOAL:   Patient will meet greater than or equal to 90% of their needs, progressing   MONITOR:   PO intake, Supplement acceptance, Labs, Skin, Weight trends  ASSESSMENT:   Patient with PMH significant for OSA, on home o2, CAD s/p CABG and multiple PCI, CHF s/p cardioMEMS, cerebrovascular disease, HTN, CKD, DM, and PAD. Presents this admission with possible CAP, hypertensive urgency, and acute exacerbation of CHF.    Pt extubated 2/25; transferred out of 2H-ICU. Bedside swallow completed 2/26; pt advanced to Regular diet. Pt re-intubated 2/29; transferred back to 2H-ICU.  Pt extubated 3/2. S/p repeat bedside swallow evaluation 3/3. Advanced back to solid diet.   PO intake at 50% per flowsheet records. Labs & medications reviewed. CBG's [phone].  Diet Order:   Diet Order            Diet heart healthy/carb modified Room service appropriate? Yes; Fluid consistency: Thin  Diet effective now             EDUCATION NEEDS:   Not appropriate for education at this time  Skin:  Skin Assessment: Skin Integrity Issues: Skin Integrity Issues:: Other (Comment) Other: MASD- abdomen perineum  Last BM:  2/28  Height:   Ht Readings from Last 1 Encounters:  04/01/18 5\' 2"  (1.575 m)   Weight:   Wt Readings from Last 1 Encounters:  04/06/18 120.4 kg   Ideal Body Weight:  52.3 kg  BMI:  Body mass index is 48.55 kg/m.  Estimated Nutritional Needs:   Kcal:  1600-1800  Protein:  90-105 gm  Fluid:  1.6-1.8 L  Arthur Holms, RD, LDN Pager #: [phone] After-Hours Pager #: [phone]

## 2018-04-06 NOTE — Progress Notes (Signed)
Report called to Junie Panning, RN on 2W. Will transport patient and all belongings.  Sherlie Ban, RN

## 2018-04-06 NOTE — Progress Notes (Addendum)
Patient ID: [name] [name], female   DOB: 01/25/1946, 73 y.o.   [name]: [id number]    Advanced Heart Failure Rounding Note   Subjective:    Denies SOB. Feeling much better. No chest pain.   Echo EF 25-30% with global HK worse from mid-ventricle to apex. RV moderately down. (Echo 1/19 EF 55-60%)  CT chest w/o contrast: No evidence for ILD, patchy ground glass infiltrates noted bilaterally, PNA vs edema.   Objective:   Weight Range:  Vital Signs:   Temp:  [97.5 F (36.4 C)-98.3 F (36.8 C)] 98.3 F (36.8 C) (03/05 0832) Pulse Rate:  [53-79] 67 (03/05 0832) Resp:  [12-22] 14 (03/05 0832) BP: (101-161)/(44-98) 140/52 (03/05 0832) SpO2:  [76 %-100 %] 99 % (03/05 0911) Weight:  [120.4 kg] 120.4 kg (03/05 0238) Last BM Date: 03/31/18  Weight change: Filed Weights   04/04/18 0500 04/05/18 0500 04/06/18 0238  Weight: 116.8 kg 117.5 kg 120.4 kg    Intake/Output:   Intake/Output Summary (Last 24 hours) at 04/06/2018 1025 Last data filed at 04/06/2018 0238 Gross per 24 hour  Intake 1219.97 ml  Output 1500 ml  Net -280.03 ml     Physical Exam: General:  In bed. No resp difficulty HEENT: normal Neck: supple. JVP 6-7. Carotids 2+ bilat; no bruits. No lymphadenopathy or thryomegaly appreciated. Cor: PMI nondisplaced. Regular rate & rhythm. No rubs, gallops or murmurs. Lungs: clear Abdomen: soft, nontender, nondistended. No hepatosplenomegaly. No bruits or masses. Good bowel sounds. Extremities: no cyanosis, clubbing, rash, edema. RUE PICC  Neuro: alert & orientedx3, cranial nerves grossly intact. moves all 4 extremities w/o difficulty. Affect pleasant   Telemetry: SR 50-80s   Labs: Basic Metabolic Panel: Recent Labs  Lab 04/02/18 0324 04/03/18 0444 04/04/18 0413 04/05/18 0321 04/06/18 0318 04/06/18 0319  NA 139 144 147* 143 137  --   K 3.1* 3.9 3.2* 4.5 3.9  --   CL 91* 101 104 104 96*  --   CO2 37* 30 33* 31 31  --   GLUCOSE 115* 182* 143* 286* 354*  --   BUN 87* 48*  31* 32* 40*  --   CREATININE 2.53* 1.65* 1.27* 1.36* 1.67*  --   CALCIUM 8.8* 8.5* 8.9 9.2 8.7*  --   MG 2.3 2.0 2.1 2.0  --  1.7  PHOS 2.1* 2.4* 1.8* 2.4* 4.3  --     Liver Function Tests: Recent Labs  Lab 04/06/18 0318  ALBUMIN 2.3*   No results for input(s): LIPASE, AMYLASE in the last 168 hours. No results for input(s): AMMONIA in the last 168 hours.  CBC: Recent Labs  Lab 04/01/18 1539  04/02/18 0324 04/03/18 0444 04/04/18 0413 04/05/18 0321 04/06/18 0319  WBC 14.9*  --  18.1* 15.9* 13.8* 14.3* 15.5*  NEUTROABS 12.3*  --   --  11.8* 10.5* 12.6* 14.1*  HGB 13.5   < > 12.6 11.7* 11.0* 11.5* 11.4*  HCT 44.1   < > 38.7 36.7 36.7 37.6 36.8  MCV 96.3  --  94.9 97.9 99.5 98.2 96.1  PLT 117*  --  136* 137* 139* 178 188   < > = values in this interval not displayed.    Cardiac Enzymes: Recent Labs  Lab 04/01/18 1539 04/02/18 0324 04/03/18 0444  TROPONINI 0.10* 0.13* 0.13*    BNP: BNP (last 3 results) Recent Labs    01/16/18 2150 03/25/18 1630  BNP 701.3* 1,480.1*    ProBNP (last 3 results) No results for input(s): PROBNP in  the last 8760 hours.    Other results:  Imaging: Dg Chest Port 1 View  Result Date: 04/05/2018 CLINICAL DATA:  Follow-up pulmonary edema EXAM: PORTABLE CHEST 1 VIEW COMPARISON:  04/03/2018 FINDINGS: Cardiac shadow is mildly enlarged but stable. Cardio Mems device is again noted and stable. Endotracheal tube and nasogastric catheter have been removed in the interval. Right-sided PICC line is noted at the cavoatrial junction. Postsurgical changes are again seen and stable. Mild interstitial edema is seen without focal confluent infiltrate. IMPRESSION: Mild interstitial edema without focal infiltrate. PICC line in satisfactory position. Electronically Signed   By: Inez Catalina M.D.   On: 04/05/2018 08:45     Medications:     Scheduled Medications: . ALPRAZolam  0.5 mg Oral BID  . apixaban  5 mg Oral BID  . bisacodyl  5 mg Oral Daily    . budesonide (PULMICORT) nebulizer solution  0.25 mg Nebulization BID  . buPROPion  150 mg Oral Daily  . Chlorhexidine Gluconate Cloth  6 each Topical Daily  . clopidogrel  75 mg Oral Daily  . docusate sodium  100 mg Oral Daily  . DULoxetine  30 mg Oral Daily  . ezetimibe  10 mg Oral QHS  . gabapentin  1,200 mg Oral QHS  . gabapentin  600 mg Oral BID  . insulin aspart  0-20 Units Subcutaneous TID WC  . insulin aspart  0-5 Units Subcutaneous QHS  . insulin aspart  6 Units Subcutaneous TID WC  . ipratropium-albuterol  3 mL Nebulization TID  . losartan  12.5 mg Oral Daily  . metoprolol succinate  50 mg Oral BID  . multivitamin  1 tablet Oral Daily  . pantoprazole  40 mg Oral Daily  . predniSONE  60 mg Oral Q breakfast  . rosuvastatin  40 mg Oral QHS  . sodium chloride flush  10-40 mL Intracatheter Q12H  . spironolactone  25 mg Oral Daily  . torsemide  40 mg Oral Daily  . vitamin B-12  1,000 mcg Oral Daily    Infusions: . sodium chloride Stopped (04/06/18 0200)  . ceFEPime (MAXIPIME) IV Stopped (04/05/18 1624)    PRN Medications: sodium chloride, hydrALAZINE, hydrocortisone cream, nitroGLYCERIN, ondansetron (ZOFRAN) IV, sodium chloride flush   Assessment:   [name] [name] is a 73 y.o. female with multiple medical problems including a hx of CAD s/p CABG and multiple PCI, HFpEF s/p CardioMEMS, AF, cerebrovascular disease, CKD, DM2, PAD, admitted 2/22 with acute respiratory failure requiring mechanical intubation in the setting of URI with fever to 102 and cough.   Plan/Discussion:    1. Acute hypoxemic respiratory failure: Suspect combination of pulmonary edema and PNA.  Initially intubated, now extubated.  Reintubated on 2/29. CXR with diffuse bilateral airspace disease and PCT 3.08=>2.39 => 0.26=>0.13.  Extubated 3/2.  CT chest- mild patchy foci in both lungs. 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  - cefepime ongoing.  - Now on  prednisone 60 mg daily.  -On room air. Stable saturations.  2. Shock: Suspect primarily septic shock, now resolved and off pressors.  She is now hypertensive.  3. ID: Suspect PNA.  Initial fever to 102.6 with high PCT.  Now afebrile.  Cultures so far negative.  - Empiric cefepime, day 6/7.  4. Acute systolic CHF: Echo with EF 25-30%, severe hypokinesis-akinesis of the mid-apical LV myocardium. Prior echo with normal EF.  ?Takotsubo event with stress of acute illness.  However, she has known extensive  coronary disease as well.  - Continue Toprol XL 50 mg bid.  - Continue spironolactone to 25 daily.  - Continue losartan 12.5 daily. No entresto for now with elevated creatinine.  - Continue torsemide 40 mg daily. (she was on 100 mg bid at home).  5. AKI on CKD: stage 3, baseline 1.4-1.8. Creatinine trending up to 1.7. 5. CAD: Last cath 9/19 with severe native CAD and LIMA patent. Sequential SVG-PLOM/OM1 with 80% proximal stenosis, s/p DES. SVG to RCA chronically occluded. No CP.  Mild elevation in troponin with no trend, likely demand ischemia. Echo with newly low EF, may be Takotsubo.  - Continue statin.  - Plavix restarted 2/26 with plts rising.  - Would eventually like to do angiography with fall in EF but creatinine up to 1.7 today.  - Hold off for now.   6. Atrial fibrillation: Paroxysmal.  Maintaining NSR. Concern for amiodarone toxicity, off amiodarone.  - Continue apixaban 5 mg bid.   7. Thrombocytopenia: ?ITP triggered by acute infection.  Doubt TTP, no schistocytes/evidence for hemolysis.  HIT negative.  Got 2 units plts 2/25.  Given a dose of Solumedrol and 2 doses IVIG. Platelet count has now recovered.  8. Delerium: Resolved.   9. Deconditioning: PT with recommendations for short term rehab.   Darrick Grinder NP_C  04/06/2018 10:25 AM   Patient seen with NP, agree with the above note.   Breathing has improved.  She is afebrile.   On exam, no JVD.  No edema.  Clear lungs.   She  remains on cefepime for ?PNA, has 1 more day.  Amiodarone stopped with ?amiodarone lung toxicity.  IV Solumedrol converted to prednisone.   On exam, volume looks ok.  Creatinine up to 1.7, she is on torsemide 40 mg daily.  This is significantly less than her home dose, will follow creatinine closely.   Would hold off on cath this admission, probably do as outpatient when creatinine stable at baseline.   Loralie Champagne 04/06/2018 2:14 PM

## 2018-04-06 NOTE — Progress Notes (Signed)
PROGRESS NOTE    [name] [name]  HUD:[id number]  DOB: 10-Mar-1945  DOA: 03/25/2018 PCP: Derinda Late, MD  Brief Narrative:   73 year old woman with history of asthma, OSA not on CPAP, chronic hypoxic respiratory failure on 2L at night, CAD s/p CABG and multiple PCI, HFpEF s/p CardioMEMS 01/16/2018, AF on Eliquis, cerebrovascular disease, HTN, CKD, DM2 and PAD.  Patient presented with 1 week history of productive cough and feeling poorly. She reported having intermittent emesis and post tussive emesis. Shewhas not able to take her medications because of her emesis.  Patient was noted to be hypoxic to the 19s when her daughter checked. In the ED, pt febrile to 102.6 with SBP 170-209, HR 70s, O2 in the mid 80s. ECG showed sinus rhythm with LBBB-like IVCD. VBG 7.38/50. Labs included Cr 1.34 (at/improved from baseline), BNP 1480 (last 701 in 01/2018), troponin 0.05. Lactate 1.8. WBC 9.4.Flu panel negative.CXR showed cardiomegaly and bilateral infiltrates. She was intubated in the ED and started on IV aztreonam. IV furosemide was given, and NTG infusion was started for BP control.  Subjective:  Patient sitting comfortably on the edge of her bed.  States worked with PT and able to take a few steps with a walker.  Reports feeling comfortable at rest but dyspneic on exertion.  Currently on room air.  She states her O2 sat dropped last night during sleep and wa placed on 2 L O2 (which is her baseline need). Objective: Vitals:   04/06/18 0911 04/06/18 1232 04/06/18 1423 04/06/18 1812  BP:  (!) 140/50  (!) 115/102  Pulse:  66  (!) 57  Resp:  14  18  Temp:  98.5 F (36.9 C)  97.9 F (36.6 C)  TempSrc:    Oral  SpO2: 99% 93% 96% 95%  Weight:      Height:        Intake/Output Summary (Last 24 hours) at 04/06/2018 1934 Last data filed at 04/06/2018 1800 Gross per 24 hour  Intake 529.97 ml  Output -  Net 529.97 ml   Filed Weights   04/04/18 0500 04/05/18 0500 04/06/18 0238  Weight: 116.8 kg  117.5 kg 120.4 kg    Physical Examination:  General exam: Appears calm and comfortable  Respiratory system: Clear to auscultation. Respiratory effort normal. Cardiovascular system: S1 & S2 heard, RRR. No JVD, murmurs, rubs, gallops or clicks.  Trace to 1+ pedal edema. Gastrointestinal system: Abdomen is nondistended, soft and nontender. No organomegaly or masses felt. Normal bowel sounds heard. Central nervous system: Alert and oriented. No focal neurological deficits. Extremities: Symmetric 5 x 5 power. Skin: No rashes, lesions or ulcers Psychiatry: Judgement and insight appear normal. Mood & affect appropriate.     Data Reviewed: I have personally reviewed following labs and imaging studies  CBC: Recent Labs  Lab 04/01/18 1539  04/02/18 0324 04/03/18 0444 04/04/18 0413 04/05/18 0321 04/06/18 0319  WBC 14.9*  --  18.1* 15.9* 13.8* 14.3* 15.5*  NEUTROABS 12.3*  --   --  11.8* 10.5* 12.6* 14.1*  HGB 13.5   < > 12.6 11.7* 11.0* 11.5* 11.4*  HCT 44.1   < > 38.7 36.7 36.7 37.6 36.8  MCV 96.3  --  94.9 97.9 99.5 98.2 96.1  PLT 117*  --  136* 137* 139* 178 188   < > = values in this interval not displayed.   Basic Metabolic Panel: Recent Labs  Lab 04/02/18 0324 04/03/18 0444 04/04/18 0413 04/05/18 0321 04/06/18 0318 04/06/18 0319  NA  139 144 147* 143 137  --   K 3.1* 3.9 3.2* 4.5 3.9  --   CL 91* 101 104 104 96*  --   CO2 37* 30 33* 31 31  --   GLUCOSE 115* 182* 143* 286* 354*  --   BUN 87* 48* 31* 32* 40*  --   CREATININE 2.53* 1.65* 1.27* 1.36* 1.67*  --   CALCIUM 8.8* 8.5* 8.9 9.2 8.7*  --   MG 2.3 2.0 2.1 2.0  --  1.7  PHOS 2.1* 2.4* 1.8* 2.4* 4.3  --    GFR: Estimated Creatinine Clearance: 38.7 mL/min (A) (by C-G formula based on SCr of 1.67 mg/dL (H)). Liver Function Tests: Recent Labs  Lab 04/06/18 0318  ALBUMIN 2.3*   No results for input(s): LIPASE, AMYLASE in the last 168 hours. No results for input(s): AMMONIA in the last 168 hours. Coagulation  Profile: Recent Labs  Lab 04/03/18 0444  INR 1.3*   Cardiac Enzymes: Recent Labs  Lab 04/01/18 1539 04/02/18 0324 04/03/18 0444  TROPONINI 0.10* 0.13* 0.13*   BNP (last 3 results) No results for input(s): PROBNP in the last 8760 hours. HbA1C: No results for input(s): HGBA1C in the last 72 hours. CBG: Recent Labs  Lab 04/05/18 1233 04/05/18 1711 04/05/18 2216 04/06/18 1124 04/06/18 1635  GLUCAP 284* 274* 292* 356* 265*   Lipid Profile: No results for input(s): CHOL, HDL, LDLCALC, TRIG, CHOLHDL, LDLDIRECT in the last 72 hours. Thyroid Function Tests: No results for input(s): TSH, T4TOTAL, FREET4, T3FREE, THYROIDAB in the last 72 hours. Anemia Panel: No results for input(s): VITAMINB12, FOLATE, FERRITIN, TIBC, IRON, RETICCTPCT in the last 72 hours. Sepsis Labs: Recent Labs  Lab 04/01/18 1539 04/02/18 0324 04/03/18 0444  PROCALCITON 0.32 0.26 0.13  LATICACIDVEN 1.1  --   --     Recent Results (from the past 240 hour(s))  Culture, blood (Routine X 2) w Reflex to ID Panel     Status: None   Collection Time: 04/01/18  4:30 PM  Result Value Ref Range Status   Specimen Description BLOOD SITE NOT SPECIFIED  Final   Special Requests   Final    BOTTLES DRAWN AEROBIC AND ANAEROBIC Blood Culture adequate volume   Culture   Final    NO GROWTH 5 DAYS Performed at [facility], [address] [place], [place] [address]    Report Status 04/06/2018 FINAL  Final  Culture, blood (Routine X 2) w Reflex to ID Panel     Status: None   Collection Time: 04/01/18  4:41 PM  Result Value Ref Range Status   Specimen Description BLOOD LEFT WRIST  Final   Special Requests AEROBIC BOTTLE ONLY Blood Culture adequate volume  Final   Culture   Final    NO GROWTH 5 DAYS Performed at [facility], [place] [address] [place], [place] [address]    Report Status 04/06/2018 FINAL  Final  Culture, Urine     Status: None   Collection Time: 04/01/18  5:05 PM  Result Value Ref Range  Status   Specimen Description URINE, RANDOM  Final   Special Requests Immunocompromised  Final   Culture   Final    NO GROWTH Performed at [facility], [place] [address] [employer], [place] [address]    Report Status 04/02/2018 FINAL  Final  Culture, respiratory (non-expectorated)     Status: None   Collection Time: 04/01/18  5:05 PM  Result Value Ref Range Status  Specimen Description TRACHEAL ASPIRATE  Final   Special Requests Immunocompromised  Final   Gram Stain   Final    RARE WBC PRESENT, PREDOMINANTLY MONONUCLEAR NO ORGANISMS SEEN    Culture   Final    NO GROWTH 2 DAYS Performed at [facility], [place] [address] [place], [place] [address]    Report Status 04/03/2018 FINAL  Final      Radiology Studies: Dg Chest Port 1 View  Result Date: 04/05/2018 CLINICAL DATA:  Follow-up pulmonary edema EXAM: PORTABLE CHEST 1 VIEW COMPARISON:  04/03/2018 FINDINGS: Cardiac shadow is mildly enlarged but stable. Cardio Mems device is again noted and stable. Endotracheal tube and nasogastric catheter have been removed in the interval. Right-sided PICC line is noted at the cavoatrial junction. Postsurgical changes are again seen and stable. Mild interstitial edema is seen without focal confluent infiltrate. IMPRESSION: Mild interstitial edema without focal infiltrate. PICC line in satisfactory position. Electronically Signed   By: Inez Catalina M.D.   On: 04/05/2018 08:45        Scheduled Meds: . ALPRAZolam  0.5 mg Oral BID  . apixaban  5 mg Oral BID  . bisacodyl  5 mg Oral Daily  . budesonide (PULMICORT) nebulizer solution  0.25 mg Nebulization BID  . buPROPion  150 mg Oral Daily  . Chlorhexidine Gluconate Cloth  6 each Topical Daily  . clopidogrel  75 mg Oral Daily  . docusate sodium  100 mg Oral Daily  . DULoxetine  30 mg Oral Daily  . ezetimibe  10 mg Oral QHS  . feeding supplement (GLUCERNA SHAKE)  237 mL Oral TID BM  . gabapentin  1,200 mg Oral QHS  . gabapentin  600 mg  Oral BID  . insulin aspart  0-20 Units Subcutaneous TID WC  . insulin aspart  0-5 Units Subcutaneous QHS  . insulin aspart  6 Units Subcutaneous TID WC  . ipratropium-albuterol  3 mL Nebulization TID  . losartan  12.5 mg Oral Daily  . metoprolol succinate  50 mg Oral BID  . multivitamin  1 tablet Oral Daily  . pantoprazole  40 mg Oral Daily  . predniSONE  60 mg Oral Q breakfast  . rosuvastatin  40 mg Oral QHS  . sodium chloride flush  10-40 mL Intracatheter Q12H  . spironolactone  25 mg Oral Daily  . torsemide  40 mg Oral Daily  . vitamin B-12  1,000 mcg Oral Daily   Continuous Infusions: . sodium chloride Stopped (04/06/18 0200)  . ceFEPime (MAXIPIME) IV 1 g (04/06/18 1553)    Assessment & Plan:    1.  Acute on chronic hypoxic respiratory failure: Felt to be multifactorial in the setting of underlying obstructive sleep apnea/possible obesity hypoventilation/diffuse pulmonary infiltrates on imaging studies suggesting pneumonia versus pulmonary edema versus amiodarone toxicity.  Patient extubated on March 2.  Pulmonary following along.  Noted EF of 25 to 30% (which is new compared to 55% in January 2019).  Patient treated for pneumonia with multiple antibiotics including Levaquin, aztreonam, vancomycin and cefepime during ICU stay.  Currently off antibiotics except for cefepime day 6/7.  She improved with IV steroids and now on oral prednisone.  O2 tapered to off albeit requiring at night which is her baseline.  Flu test and sputum cultures sent from ICU stay negative.Autoimmune profile sent 3/1: ANA with reflex: Positive, ANCA titers negative Rheumatoid factor negative per ICU notes.  Appreciate pulmonary follow-up and recommendations.  Will need O2 desat studies prior to  discharge.  Hold amiodarone and continue diuretics.  2.  New systolic cardiomyopathy with low EF of 25%: Presumed Takotsubo cardiomyopathy with global hypokinesis.  Right ventricular function moderately reduced as well.   She does have a history of CAD status post CABG.Continue diuretics (torsemide and Aldactone), beta-blockers, losartan.  Patient states her leg swellings are much improved.   Patient noted to be on weekly Zaroxolyn dosage in addition to torsemide as outpatient.  3.  AKI on chronic kidney disease stage III: Creatinine now stable around 1.8.  Continue to monitor on diuretics.  Patient was on higher dose of torsemide at home.  4.  Paroxysmal atrial fibrillation: Off amiodarone.  Continue anticoagulation and beta-blockers.  5.  CAD status post CABG:Continue Plavix/statins/beta-blockers.  Cardiac cath deferred by cardiology but will need outpatient follow-up.  Patient also on Ranexa as outpatient.  6.  Diabetes mellitus, uncontrolled: Blood glucose fluctuating between 260 to 350.  Steroids likely worsening.  Currently on pre-meal insulin.  Patient at baseline takes 18 to 26 units of Humalog 3 times a day before meals addition to Lantus 50 units at bedtime.  Will resume Lantus.  7.  Hyperlipidemia: Resume statins and Zetia.  8. 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  Platelet count now improving.  She received IVIG 1 mg/kg daily for 2 days during ICU stay, and responded well.   9.  GERD: PPI  10.  Chronic back pain: Resume home medications  11.  Chronic insomnia: Resumed home medications (bupropion, Xanax and Cymbalta)  DVT prophylaxis: On anticoagulation Code Status: Full code Family / Patient Communication: Discussed with patient and answered all questions. Disposition Plan: Likely will need rehab.     LOS: 12 days    Time spent: 45 minutes    Guilford Shi, MD [facility] Pager [phone]  If 7PM-7AM, please contact night-coverage [email or web address] Password [facility] 04/06/2018, 7:34 PM

## 2018-04-06 NOTE — Progress Notes (Addendum)
NAME:  [name] [name], [name]:  [id number], DOB:  [birth year], LOS: 25 ADMISSION DATE:  03/25/2018, CONSULTATION DATE:  03/25/2018 REFERRING MD:  ED physician, CHIEF COMPLAINT:  Acute hypoxic respiratory failure   Brief History   73 year old woman with history of asthma, OSA not on CPAP, chronic hypoxic respiratory failure on 2L at night, CAD s/p CABG and multiple PCI, HFpEF s/p CardioMEMS 01/16/2018, AF on Eliquis, cerebrovascular disease, HTN, CKD, DM2, PAD presenting with cough, feeling poorly for the past week with cough productive of sputum. She has been having intermittent emesis and post tussive emesis. She has not been able to take her medications because of her emesis.She was noted to be hypoxic to the 70s this afternoon when her daughter checked.   In the ED, pt febrile to 102.6 with SBP 170-209, HR 70s, O2 in the mid 80s. ECG showed sinus rhythm with LBBB-like IVCD. VBG 7.38/50. Labs included Cr 1.34 (at/improved from baseline), BNP 1480 (last 701 in 01/2018), troponin 0.05. Lactate 1.8. WBC 9.4. Flu panel negative. CXR showed cardiomegaly and bilateral infiltrates. She was intubated in the ED and started on IV aztreonam. IV furosemide was given, and NTG infusion was started for BP control.    Past Medical History  asthma, OSA not on CPAP, chronic hypoxic respiratory failure on 2L at night, CAD s/p CABG and multiple PCI, HFpEF s/p CardioMEMS, AF on Eliquis, cerebrovascular disease, HTN, CKD, DM2, PAD  Significant Hospital Events   2/22 > admit, intubated. Urine with e colii 2/23- echo EF 25-30% with global HK worse from mid-ventricle to apex. RV moderately down. (Echo 1/19 EF 55-60%) 2/25- NAEO. Improved nausea with phenergan. Transferrred and ccm signed off. Plat < 5k). HITT - negtive 2/26 - CT head - nil acute (for ITP - > Received 1 unit of platelets on 03/28/2018 and IVIG 1 mg/kg X 2 doses 03/28/2018 and 03/29/2018 2.27 - platelet upat 74K . Concerne is ITP (not TTP). Ongoing delirum but  improved. Cards recommendnig to limit sterods. Cardd ddx - acute IHD s-CHF v Takasubo. Maintained on amio for PAF. Rx with levaquin for CAP NPOS 2/28 extubated 2/29 re-intubated 3/3: intubated 03/25/2018 through 03/27/2018. Reintubated 04/01/2018. Etiology not entirely clear. Diffuse infiltrate with working diagnosis of pulmonary  edema vs infection vs inflammatory process. S/p treatment with antibiotics. Concern for amiodarone toxicity.  Started on Solu-Medrol overall she is improved.  Extubated 3/2 passed swallowing evaluation. 3/4: Now off from oxygen.  Clinically improved.  Delirium improving.  Glycemic control has been an issue overnight Consults:  Cardiology 2/22 Hematology  Procedures:  Intubated 2/22>2/24, 2/29 >> PA cath 3/1 >>out  Significant Diagnostic Tests:  CXR 2/22 > Cardiomegaly. Worsening bilateral airspace disease CXR 2/24> pulmonary edema pattern CXR 2/25> worsening RUL opacity  Autoimmune profile sent 3/1: ANA with reflex: Positive, ANCA titers negative Rheumatoid factor negative Cyclic citral antibodies: Negative  Micro Data:  BCx x 2 2/22 >> no growth 24 hours MRSA pcr 2/23 - neg  Resp cx 2/24 >> neg UCx 2/22 >> e coli  Influenza PCR 2/22 >> neg 2/29 blood >>> 2/29 URine > neg 2/29 > sputum neg  Antimicrobials:  Aztreonam 2/22 Levaquin 2/23 Vancomycin 2/23 Cefepime 2/23>>  Interim history/subjective:  No c/o.  Now on regular floor.  On RA.  Denies SOB.    Objective   Blood pressure 131/65, pulse 64, temperature 97.9 F (36.6 C), temperature source Oral, resp. rate 19, height 5\' 2"  (1.575 m), weight 120.4 kg, SpO2 100 %. CVP:  [  2 mmHg-14 mmHg] 11 mmHg      Intake/Output Summary (Last 24 hours) at 04/06/2018 0809 Last data filed at 04/06/2018 0238 Gross per 24 hour  Intake 1219.97 ml  Output 1500 ml  Net -280.03 ml   Filed Weights   04/04/18 0500 04/05/18 0500 04/06/18 0238  Weight: 116.8 kg 117.5 kg 120.4 kg   General: chronically ill appearing  female, NAD  HEENT: mm moist, no JVD  Cardiac: s1s2 rrr  Pulmonary: resps even non labored on RA, diminished bases, otherwise clear  Abdomen: Soft, not tender.  No organomegaly Extremities: Warm and dry Neuro: awake, alert, appropriate, MAE  GU: Clear yellow    Septic and cardiogenic shock resolved 3/2 Assessment & Plan:   Acute hypoxemic respiratory failure in setting of diffuse pulmonary infiltrates: Etiology not entirely clear, question hypersensitivity pneumonitis possibly from amiodarone versus ARDS, vs flash edema in setting of takotsubo CM Successfully extubated 3/2 Improving clinically - off O2 Thus far autoimmune panel not impressive Plan Continue pulse oximetry  Aspiration precautions  Day #6 of 7 cefepime  Continue steroids -- transition to PO prednisone 3/5 with slow taper  Repeat chest x-ray again on 3/6 Follow-up additional pending autoimmune panel Continue to hold amiodarone given concern for amio toxicity  Will need ambulatory desat prior to d/c   Acute HFrEF; felt to have element of Takotsubo physiology:  LVEF 25%. She has extensive cardiac history, which is the likely culprit in the setting of critical illness, however, HF mentions some concern of Takotsubo. AFib  CAD s/p CABG  Plan Continue lopressor  Cardiology resuming torsemide  Spironolactone increasing 25 daily  Losartan added  Will eventually need repeat echocardiogram  ASA plavix apixaban  Continue to hold amiodarone given concern for amio toxicity  Cards/Heart failure team following   AKI on CKD -  Renal US 04/01/18 without hydro. -Renal function had improved, seems to have plateaued Plan Avoid hypotension Repeat chemistry a.m. Daily assessment for diuretic   Code Status: FULL Family Communication: No family bedside 3/5, pt updated at length  Disposition: Clinically improving. Change to PO steroids and taper slowly.  Maintain off amiodarone.  Pulmonary hygiene, mobilize.  Cards following.     [name] Madrid, NP 04/06/2018  8:09 AM Pager: [phone] or [phone]  Attending Note:  73 year old female with history of chronic hypoxemic respiratory failure and CHF presenting to PCCM with respiratory infiltrate and acute on chronic respiratory failure.  Patient was extubated and transferred out of the ICU.  On exam, fine rales diffusely.  I reviewed CXR myself, pulmonary infiltrate noted.  Discussed with PCCM-NP.  Amiodarone lung:  - Change steroids to PO with a slow taper  Hypoxemia:  - Titrate O2 for sat of 88-92%  - I do not believe patient will need O2 during the day  OSA:  - CPAP at night  - ?if will need f/u for this as outpatient  HCAP:  - Cefepime for a total of 8 days  PCCM will continue to follow  Patient seen and examined, agree with above note.  I dictated the care and orders written for this patient under my direction.  Rush Farmer, [place]

## 2018-04-07 ENCOUNTER — Ambulatory Visit (HOSPITAL_COMMUNITY): Payer: Medicare Other

## 2018-04-07 LAB — BASIC METABOLIC PANEL
Anion gap: 12 (ref 5–15)
BUN: 59 mg/dL — ABNORMAL HIGH (ref 8–23)
CO2: 30 mmol/L (ref 22–32)
Calcium: 8.7 mg/dL — ABNORMAL LOW (ref 8.9–10.3)
Chloride: 93 mmol/L — ABNORMAL LOW (ref 98–111)
Creatinine, Ser: 2.06 mg/dL — ABNORMAL HIGH (ref 0.44–1.00)
GFR calc Af Amer: 28 mL/min — ABNORMAL LOW (ref >60)
GFR calc non Af Amer: 24 mL/min — ABNORMAL LOW (ref >60)
Glucose, Bld: 242 mg/dL — ABNORMAL HIGH (ref 70–99)
Potassium: 3.6 mmol/L (ref 3.5–5.1)
Sodium: 135 mmol/L (ref 135–145)

## 2018-04-07 LAB — COOXEMETRY PANEL
Carboxyhemoglobin: 1.5 % (ref 0.5–1.5)
Methemoglobin: 1.1 % (ref 0.0–1.5)
O2 Saturation: 64.2 %
Total hemoglobin: 12.7 g/dL (ref 12.0–16.0)

## 2018-04-07 LAB — GLUCOSE, CAPILLARY
Glucose-Capillary: 216 mg/dL — ABNORMAL HIGH (ref 70–99)
Glucose-Capillary: 204 mg/dL — ABNORMAL HIGH (ref 70–99)
Glucose-Capillary: 336 mg/dL — ABNORMAL HIGH (ref 70–99)
Glucose-Capillary: 280 mg/dL — ABNORMAL HIGH (ref 70–99)

## 2018-04-07 MED ORDER — INSULIN GLARGINE 100 UNIT/ML ~~LOC~~ SOLN
35.0000 [IU] | Freq: Every day | SUBCUTANEOUS | Status: DC
  Administered 2018-04-07 – 2018-04-09 (×3): 35 [IU] via SUBCUTANEOUS
  Filled 2018-04-07 (×5): qty 0.35

## 2018-04-07 MED ORDER — PREDNISONE 20 MG PO TABS
40.0000 mg | ORAL_TABLET | Freq: Every day | ORAL | Status: DC
  Administered 2018-04-08 – 2018-04-10 (×3): 40 mg via ORAL
  Filled 2018-04-07 (×4): qty 2

## 2018-04-07 MED ORDER — TORSEMIDE 20 MG PO TABS: 40.0000 mg | ORAL_TABLET | Freq: Every day | ORAL | Status: DC

## 2018-04-07 MED ORDER — INSULIN ASPART 100 UNIT/ML ~~LOC~~ SOLN
8.0000 [IU] | Freq: Three times a day (TID) | SUBCUTANEOUS | Status: DC
  Administered 2018-04-08 – 2018-04-10 (×9): 8 [IU] via SUBCUTANEOUS

## 2018-04-07 MED ORDER — IPRATROPIUM-ALBUTEROL 0.5-2.5 (3) MG/3ML IN SOLN
3.0000 mL | Freq: Two times a day (BID) | RESPIRATORY_TRACT | Status: DC
  Administered 2018-04-07 – 2018-04-10 (×6): 3 mL via RESPIRATORY_TRACT
  Filled 2018-04-07 (×7): qty 3

## 2018-04-07 NOTE — Care Management Important Message (Signed)
Important Message  Patient Details  Name: [name] [name] [name]: [id number] Date of Birth: 1945-04-23   Medicare Important Message Given:  Yes    Gulianna Hornsby P [place] 04/07/2018, 11:11 AM

## 2018-04-07 NOTE — Discharge Instructions (Addendum)
Heart Failure °Heart failure is a condition in which the heart has trouble pumping blood because it has become weak or stiff. This means that the heart does not pump blood efficiently for the body to work well. For some people with heart failure, fluid may back up into the lungs and there may be swelling (edema) in the lower legs. Heart failure is usually a long-term (chronic) condition. It is important for you to take good care of yourself and follow the treatment plan from your health care provider. °What are the causes? °This condition is caused by some health problems, including: °· High blood pressure (hypertension). Hypertension causes the heart muscle to work harder than normal. High blood pressure eventually causes the heart to become stiff and weak. °· Coronary artery disease (CAD). CAD is the buildup of cholesterol and fat (plaques) in the arteries of the heart. °· Heart attack (myocardial infarction). Injured tissue, which is caused by the heart attack, does not contract as well and the heart's ability to pump blood is weakened. °· Abnormal heart valves. When the heart valves do not open and close properly, the heart muscle must pump harder to keep the blood flowing. °· Heart muscle disease (cardiomyopathy or myocarditis). Heart muscle disease is damage to the heart muscle from a variety of causes, such as drug or alcohol abuse, infections, or unknown causes. These can increase the risk of heart failure. °· Lung disease. When the lungs do not work properly, the heart must work harder. °What increases the risk? °Risk of heart failure increases as a person ages. This condition is also more likely to develop in people who: °· Are overweight. °· Are female. °· Smoke or chew tobacco. °· Abuse alcohol or illegal drugs. °· Have taken medicines that can damage the heart, such as chemotherapy drugs. °· Have diabetes. °? High blood sugar (glucose) is associated with high fat (lipid) levels in the blood. °? Diabetes  can also damage tiny blood vessels that carry nutrients to the heart muscle. °· Have abnormal heart rhythms. °· Have thyroid problems. °· Have low blood counts (anemia). °What are the signs or symptoms? °Symptoms of this condition include: °· Shortness of breath with activity, such as when climbing stairs. °· Persistent cough. °· Swelling of the feet, ankles, legs, or abdomen. °· Unexplained weight gain. °· Difficulty breathing when lying flat (orthopnea). °· Waking from sleep because of the need to sit up and get more air. °· Rapid heartbeat. °· Fatigue and loss of energy. °· Feeling light-headed, dizzy, or close to fainting. °· Loss of appetite. °· Nausea. °· Increased urination during the night (nocturia). °· Confusion. °How is this diagnosed? °This condition is diagnosed based on: °· Medical history, symptoms, and a physical exam. °· Diagnostic tests, which may include: °? Echocardiogram. °? Electrocardiogram (ECG). °? Chest X-ray. °? Blood tests. °? Exercise stress test. °? Radionuclide scans. °? Cardiac catheterization and angiogram. °How is this treated? °Treatment for this condition is aimed at managing the symptoms of heart failure. Medicines, behavioral changes, or other treatments may be necessary to treat heart failure. °Medicines °These may include: °· Angiotensin-converting enzyme (ACE) inhibitors. This type of medicine blocks the effects of a blood protein called angiotensin-converting enzyme. ACE inhibitors relax (dilate) the blood vessels and help to lower blood pressure. °· Angiotensin receptor blockers (ARBs). This type of medicine blocks the actions of a blood protein called angiotensin. ARBs dilate the blood vessels and help to lower blood pressure. °· Water pills (diuretics). Diuretics cause   the kidneys to remove salt and water from the blood. The extra fluid is removed through urination, leaving a lower volume of blood that the heart has to pump. °· Beta blockers. These improve heart muscle  strength and they prevent the heart from beating too quickly. °· Digoxin. This increases the force of the heartbeat. °Healthy behavior changes °These may include: °· Reaching and maintaining a healthy weight. °· Stopping smoking or chewing tobacco. °· Eating heart-healthy foods. °· Limiting or avoiding alcohol. °· Stopping use of street drugs (illegal drugs). °· Physical activity. °Other treatments °These may include: °· Surgery to open blocked coronary arteries or repair damaged heart valves. °· Placement of a biventricular pacemaker to improve heart muscle function (cardiac resynchronization therapy). This device paces both the right ventricle and left ventricle. °· Placement of a device to treat serious abnormal heart rhythms (implantable cardioverter defibrillator, or ICD). °· Placement of a device to improve the pumping ability of the heart (left ventricular assist device, or LVAD). °· Heart transplant. This can cure heart failure, and it is considered for certain patients who do not improve with other therapies. °Follow these instructions at home: °Medicines °· Take over-the-counter and prescription medicines only as told by your health care provider. Medicines are important in reducing the workload of your heart, slowing the progression of heart failure, and improving your symptoms. °? Do not stop taking your medicine unless your health care provider told you to do that. °? Do not skip any dose of medicine. °? Refill your prescriptions before you run out of medicine. You need your medicines every day. °Eating and drinking ° °· Eat heart-healthy foods. Talk with a dietitian to make an eating plan that is right for you. °? Choose foods that contain no trans fat and are low in saturated fat and cholesterol. Healthy choices include fresh or frozen fruits and vegetables, fish, lean meats, legumes, fat-free or low-fat dairy products, and whole-grain or high-fiber foods. °? Limit salt (sodium) if directed by your  health care provider. Sodium restriction may reduce symptoms of heart failure. Ask a dietitian to recommend heart-healthy seasonings. °? Use healthy cooking methods instead of frying. Healthy methods include roasting, grilling, broiling, baking, poaching, steaming, and stir-frying. °· Limit your fluid intake if directed by your health care provider. Fluid restriction may reduce symptoms of heart failure. °Lifestyle ° °· Stop smoking or using chewing tobacco. Nicotine and tobacco can damage your heart and your blood vessels. Do not use nicotine gum or patches before talking to your health care provider. °· Limit alcohol intake to no more than 1 drink per day for non-pregnant women and 2 drinks per day for men. One drink equals 12 oz of beer, 5 oz of wine, or 1½ oz of hard liquor. °? Drinking more than that is harmful to your heart. Tell your health care provider if you drink alcohol several times a week. °? Talk with your health care provider about whether any level of alcohol use is safe for you. °? If your heart has already been damaged by alcohol or you have severe heart failure, drinking alcohol should be stopped completely. °· Stop use of illegal drugs. °· Lose weight if directed by your health care provider. Weight loss may reduce symptoms of heart failure. °· Do moderate physical activity if directed by your health care provider. People who are elderly and people with severe heart failure should consult with a health care provider for physical activity recommendations. °Monitor important information ° °· Weigh   yourself every day. Keeping track of your weight daily helps you to notice excess fluid sooner. ? Weigh yourself every morning after you urinate and before you eat breakfast. ? Wear the same amount of clothing each time you weigh yourself. ? Record your daily weight. Provide your health care provider with your weight record.  Monitor and record your blood pressure as told by your health care  provider.  Check your pulse as told by your health care provider. Dealing with extreme temperatures  If the weather is extremely hot: ? Avoid vigorous physical activity. ? Use air conditioning or fans or seek a cooler location. ? Avoid caffeine and alcohol. ? Wear loose-fitting, lightweight, and light-colored clothing.  If the weather is extremely cold: ? Avoid vigorous physical activity. ? Layer your clothes. ? Wear mittens or gloves, a hat, and a scarf when you go outside. ? Avoid alcohol. General instructions  Manage other health conditions such as hypertension, diabetes, thyroid disease, or abnormal heart rhythms as told by your health care provider.  Learn to manage stress. If you need help to do this, ask your health care provider.  Plan rest periods when fatigued.  Get ongoing education and support as needed.  Participate in or seek rehabilitation as needed to maintain or improve independence and quality of life.  Stay up to date with immunizations. Keeping current on pneumococcal and influenza immunizations is especially important to prevent respiratory infections.  Keep all follow-up visits as told by your health care provider. This is important. Contact a health care provider if:  You have a rapid weight gain.  You have increasing shortness of breath that is unusual for you.  You are unable to participate in your usual physical activities.  You tire easily.  You cough more than normal, especially with physical activity.  You have any swelling or more swelling in areas such as your hands, feet, ankles, or abdomen.  You are unable to sleep because it is hard to breathe.  You feel like your heart is beating quickly (palpitations).  You become dizzy or light-headed when you stand up. Get help right away if:  You have difficulty breathing.  You notice or your family notices a change in your awareness, such as having trouble staying awake or having difficulty  with concentration.  You have pain or discomfort in your chest.  You have an episode of fainting (syncope). This information is not intended to replace advice given to you by your health care provider. Make sure you discuss any questions you have with your health care provider. Document Released: 01/18/2005 Document Revised: 12/17/2016 Document Reviewed: 08/13/2015 Elsevier Interactive Patient Education  2019 [place] on my medicine - ELIQUIS (apixaban)  This medication education was reviewed with me or my healthcare representative as part of my discharge preparation.  Why was Eliquis prescribed for you? Eliquis was prescribed for you to reduce the risk of a blood clot forming that can cause a stroke if you have a medical condition called atrial fibrillation (a type of irregular heartbeat).  What do You need to know about Eliquis ? Take your Eliquis TWICE DAILY - one tablet in the morning and one tablet in the evening with or without food. If you have difficulty swallowing the tablet whole please discuss with your pharmacist how to take the medication safely.  Take Eliquis exactly as prescribed by your doctor and DO NOT stop taking Eliquis without talking to the doctor who prescribed the medication.  Stopping may increase your risk of developing a stroke.  Refill your prescription before you run out.  After discharge, you should have regular check-up appointments with your healthcare provider that is prescribing your Eliquis.  In the future your dose may need to be changed if your kidney function or weight changes by a significant amount or as you get older.  What do you do if you miss a dose? If you miss a dose, take it as soon as you remember on the same day and resume taking twice daily.  Do not take more than one dose of ELIQUIS at the same time to make up a missed dose.  Important Safety Information A possible side effect of Eliquis is bleeding. You  should call your healthcare provider right away if you experience any of the following: ? Bleeding from an injury or your nose that does not stop. ? Unusual colored urine (red or dark brown) or unusual colored stools (red or black). ? Unusual bruising for unknown reasons. ? A serious fall or if you hit your head (even if there is no bleeding).  Some medicines may interact with Eliquis and might increase your risk of bleeding or clotting while on Eliquis. To help avoid this, consult your healthcare provider or pharmacist prior to using any new prescription or non-prescription medications, including herbals, vitamins, non-steroidal anti-inflammatory drugs (NSAIDs) and supplements.  This website has more information on Eliquis (apixaban): http://www.eliquis.com/eliquis/home

## 2018-04-07 NOTE — Clinical Social Work Note (Signed)
Spoke with son regarding [facility] not having a bed for pt and now family would like to pursue [facility]. Family has been in contact with facility and states they have two beds available. Referral sent.   [place], Bernie

## 2018-04-07 NOTE — Progress Notes (Signed)
PROGRESS NOTE    [name] [name]  WCH:[id number]  DOB: [birth year]  DOA: 03/25/2018 PCP: Derinda Late, MD  Brief Narrative:   73 year old woman with history of asthma, OSA not on CPAP, chronic hypoxic respiratory failure on 2L at night, CAD s/p CABG and multiple PCI, HFpEF s/p CardioMEMS 01/16/2018, AF on Eliquis, cerebrovascular disease, HTN, CKD, DM2 and PAD.  Patient presented with 1 week history of productive cough and feeling poorly. She reported having intermittent emesis and post tussive emesis. Shewhas not able to take her medications because of her emesis.  Patient was noted to be hypoxic to the 80s when her daughter checked. In the ED, pt febrile to 102.6 with SBP 170-209, HR 70s, O2 in the mid 80s. ECG showed sinus rhythm with LBBB-like IVCD. VBG 7.38/50. Labs included Cr 1.34 (at/improved from baseline), BNP 1480 (last 701 in 01/2018), troponin 0.05. Lactate 1.8. WBC 9.4.Flu panel negative.CXR showed cardiomegaly and bilateral infiltrates. She was intubated in the ED and started on IV aztreonam. IV furosemide was given, and NTG infusion was started for BP control.  Subjective: Reports feeling comfortable at rest but dyspneic on exertion. She is back on 2 L O2 (which is her baseline nocturnal need). Objective: Vitals:   04/07/18 0840 04/07/18 0842 04/07/18 1137 04/07/18 1717  BP: (!) 162/128 (!) 133/55 (!) 154/65 133/64  Pulse: (!) 47 (!) 47 (!) 54 (!) 49  Resp: 18  19 19   Temp: (!) 97.5 F (36.4 C)  98.5 F (36.9 C) 98.5 F (36.9 C)  TempSrc: Oral     SpO2: 96% 96% 95% 92%  Weight:      Height:        Intake/Output Summary (Last 24 hours) at 04/07/2018 1824 Last data filed at 04/07/2018 1705 Gross per 24 hour  Intake 1060 ml  Output 1800 ml  Net -740 ml   Filed Weights   04/05/18 0500 04/06/18 0238 04/07/18 0500  Weight: 117.5 kg 120.4 kg 120.4 kg    Physical Examination:  General exam: Appears calm and comfortable  Respiratory system: Clear to auscultation.  Respiratory effort normal. Cardiovascular system: S1 & S2 heard, RRR. No JVD, murmurs, rubs, gallops or clicks.  Trace to 1+ pedal edema. Gastrointestinal system: Abdomen is nondistended, soft and nontender. No organomegaly or masses felt. Normal bowel sounds heard. Central nervous system: Alert and oriented. No focal neurological deficits. Extremities: Symmetric 5 x 5 power. Skin: No rashes, lesions or ulcers Psychiatry: Judgement and insight appear normal. Mood & affect appropriate.     Data Reviewed: I have personally reviewed following labs and imaging studies  CBC: Recent Labs  Lab 04/01/18 1539  04/02/18 0324 04/03/18 0444 04/04/18 0413 04/05/18 0321 04/06/18 0319  WBC 14.9*  --  18.1* 15.9* 13.8* 14.3* 15.5*  NEUTROABS 12.3*  --   --  11.8* 10.5* 12.6* 14.1*  HGB 13.5   < > 12.6 11.7* 11.0* 11.5* 11.4*  HCT 44.1   < > 38.7 36.7 36.7 37.6 36.8  MCV 96.3  --  94.9 97.9 99.5 98.2 96.1  PLT 117*  --  136* 137* 139* 178 188   < > = values in this interval not displayed.   Basic Metabolic Panel: Recent Labs  Lab 04/02/18 0324 04/03/18 0444 04/04/18 0413 04/05/18 0321 04/06/18 0318 04/06/18 0319 04/07/18 0500  NA 139 144 147* 143 137  --  135  K 3.1* 3.9 3.2* 4.5 3.9  --  3.6  CL 91* 101 104 104 96*  --  93*  CO2 37* 30 33* 31 31  --  30  GLUCOSE 115* 182* 143* 286* 354*  --  242*  BUN 87* 48* 31* 32* 40*  --  59*  CREATININE 2.53* 1.65* 1.27* 1.36* 1.67*  --  2.06*  CALCIUM 8.8* 8.5* 8.9 9.2 8.7*  --  8.7*  MG 2.3 2.0 2.1 2.0  --  1.7  --   PHOS 2.1* 2.4* 1.8* 2.4* 4.3  --   --    GFR: Estimated Creatinine Clearance: 31.4 mL/min (A) (by C-G formula based on SCr of 2.06 mg/dL (H)). Liver Function Tests: Recent Labs  Lab 04/06/18 0318  ALBUMIN 2.3*   No results for input(s): LIPASE, AMYLASE in the last 168 hours. No results for input(s): AMMONIA in the last 168 hours. Coagulation Profile: Recent Labs  Lab 04/03/18 0444  INR 1.3*   Cardiac  Enzymes: Recent Labs  Lab 04/01/18 1539 04/02/18 0324 04/03/18 0444  TROPONINI 0.10* 0.13* 0.13*   BNP (last 3 results) No results for input(s): PROBNP in the last 8760 hours. HbA1C: No results for input(s): HGBA1C in the last 72 hours. CBG: Recent Labs  Lab 04/06/18 2154 04/06/18 2235 04/07/18 0718 04/07/18 1134 04/07/18 1603  GLUCAP 294* 350* 216* 204* 336*   Lipid Profile: No results for input(s): CHOL, HDL, LDLCALC, TRIG, CHOLHDL, LDLDIRECT in the last 72 hours. Thyroid Function Tests: No results for input(s): TSH, T4TOTAL, FREET4, T3FREE, THYROIDAB in the last 72 hours. Anemia Panel: No results for input(s): VITAMINB12, FOLATE, FERRITIN, TIBC, IRON, RETICCTPCT in the last 72 hours. Sepsis Labs: Recent Labs  Lab 04/01/18 1539 04/02/18 0324 04/03/18 0444  PROCALCITON 0.32 0.26 0.13  LATICACIDVEN 1.1  --   --     Recent Results (from the past 240 hour(s))  Culture, blood (Routine X 2) w Reflex to ID Panel     Status: None   Collection Time: 04/01/18  4:30 PM  Result Value Ref Range Status   Specimen Description BLOOD SITE NOT SPECIFIED  Final   Special Requests   Final    BOTTLES DRAWN AEROBIC AND ANAEROBIC Blood Culture adequate volume   Culture   Final    NO GROWTH 5 DAYS Performed at [facility], [address], [place], [place] [address]    Report Status 04/06/2018 FINAL  Final  Culture, blood (Routine X 2) w Reflex to ID Panel     Status: None   Collection Time: 04/01/18  4:41 PM  Result Value Ref Range Status   Specimen Description BLOOD LEFT WRIST  Final   Special Requests AEROBIC BOTTLE ONLY Blood Culture adequate volume  Final   Culture   Final    NO GROWTH 5 DAYS Performed at [facility], [place] [address], [place], [place] [address]    Report Status 04/06/2018 FINAL  Final  Culture, Urine     Status: None   Collection Time: 04/01/18  5:05 PM  Result Value Ref Range Status   Specimen Description URINE, RANDOM  Final   Special  Requests Immunocompromised  Final   Culture   Final    NO GROWTH Performed at [facility], [place] [address], [place], [place] [address]    Report Status 04/02/2018 FINAL  Final  Culture, respiratory (non-expectorated)     Status: None   Collection Time: 04/01/18  5:05 PM  Result Value Ref Range Status   Specimen Description TRACHEAL ASPIRATE  Final   Special Requests Immunocompromised  Final  Gram Stain   Final    RARE WBC PRESENT, PREDOMINANTLY MONONUCLEAR NO ORGANISMS SEEN    Culture   Final    NO GROWTH 2 DAYS Performed at [facility] [address] [place], [place] [address]    Report Status 04/03/2018 FINAL  Final      Radiology Studies: No results found.      Scheduled Meds: . ALPRAZolam  0.5 mg Oral BID  . apixaban  5 mg Oral BID  . bisacodyl  5 mg Oral Daily  . budesonide (PULMICORT) nebulizer solution  0.25 mg Nebulization BID  . buPROPion  150 mg Oral Daily  . Chlorhexidine Gluconate Cloth  6 each Topical Daily  . clopidogrel  75 mg Oral Daily  . docusate sodium  100 mg Oral Daily  . DULoxetine  30 mg Oral Daily  . ezetimibe  10 mg Oral QHS  . feeding supplement (GLUCERNA SHAKE)  237 mL Oral TID BM  . gabapentin  1,200 mg Oral QHS  . gabapentin  600 mg Oral BID  . insulin aspart  0-20 Units Subcutaneous TID WC  . insulin aspart  0-5 Units Subcutaneous QHS  . insulin aspart  6 Units Subcutaneous TID WC  . insulin glargine  20 Units Subcutaneous QHS  . ipratropium-albuterol  3 mL Nebulization BID  . metoprolol succinate  50 mg Oral BID  . multivitamin  1 tablet Oral Daily  . pantoprazole  40 mg Oral Daily  . [START ON 04/08/2018] predniSONE  40 mg Oral Q breakfast  . rosuvastatin  40 mg Oral QHS  . sodium chloride flush  10-40 mL Intracatheter Q12H  . spironolactone  25 mg Oral Daily  . [START ON 04/09/2018] torsemide  40 mg Oral Daily  . vitamin B-12  1,000 mcg Oral Daily   Continuous Infusions: . sodium chloride Stopped (04/06/18 0200)     Assessment & Plan:    1.  Acute on chronic hypoxic respiratory failure: Felt to be multifactorial in the setting of underlying obstructive sleep apnea/possible obesity hypoventilation/diffuse pulmonary infiltrates on imaging studies suggesting pneumonia versus pulmonary edema versus amiodarone toxicity.  Patient extubated on March 2.  Pulmonary following along.  Noted EF of 25 to 30% (which is new compared to 55% in January 2019).  Patient treated for pneumonia with multiple antibiotics including Levaquin, aztreonam, vancomycin and cefepime during ICU stay.  Currently off antibiotics except for cefepime day 7/7.  She improved with IV steroids and now on oral prednisone.  Pulmonary recommends slow taper over 4 weeks. Flu test and sputum cultures sent from ICU stay negative.Autoimmune work-up negative thus far.  Appreciate pulmonary follow-up and recommendations.    Hold amiodarone and continue diuretics.Will need O2 desat studies prior to discharge-likely will need continuous home O2 2 L  2.  New systolic cardiomyopathy with low EF of 25%: Presumed Takotsubo cardiomyopathy with global hypokinesis.  Right ventricular function moderately reduced as well.  She does have a history of CAD status post CABG.Continue diuretics (torsemide and Aldactone), beta-blockers, losartan.  Patient states her leg swellings are much improved.   Patient noted to be on weekly Zaroxolyn  in addition to torsemide as outpatient.  3.  AKI on chronic kidney disease stage III: Creatinine now stable around 1.8.  Continue to monitor on diuretics.  Patient was on higher dose of torsemide at home.  4.  Paroxysmal atrial fibrillation: Off amiodarone.  Continue anticoagulation and beta-blockers.  5.  CAD status post CABG:Continue Plavix/statins/beta-blockers.  Cardiac  cath deferred by cardiology but will need outpatient follow-up.  Patient also on Ranexa as outpatient.  6.  Diabetes mellitus 2 with hyperglycemia:  Steroids likely  worsening. Patient at baseline takes 18 to 26 units of Humalog 3 times a day before meals addition to Lantus 50 units at bedtime.  Resumed Lantus at a lower dose on March 5 given diet noncompliance at home, will increase dosage as blood glucose still high.  7.  Hyperlipidemia: Resume statins and Zetia.  8. 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  Platelet count now improving.  She received IVIG 1 mg/kg daily for 2 days during ICU stay, and responded well.   9.  GERD: PPI  10.  Chronic back pain: Resume home medications  11.  Chronic insomnia: Resumed home medications (bupropion, Xanax and Cymbalta)  DVT prophylaxis: On anticoagulation Code Status: Full code Family / Patient Communication: Discussed with patient and answered all questions. Disposition Plan: Likely will need rehab.  Able to work with PT and with minimal O2 requirements.  Will downgrade to telemetry status.     LOS: 13 days    Time spent: 45 minutes    Guilford Shi, MD [facility] Pager [phone]  If 7PM-7AM, please contact night-coverage [email or web address] Password [facility] 04/07/2018, 6:24 PM

## 2018-04-07 NOTE — Progress Notes (Addendum)
NAME:  [name] [name], [name]:  [id number], DOB:  [birth year], LOS: 40 ADMISSION DATE:  03/25/2018, CONSULTATION DATE:  03/25/2018 REFERRING MD:  ED physician, CHIEF COMPLAINT:  Acute hypoxic respiratory failure   Brief History   73 year old woman with history of asthma, OSA not on CPAP, chronic hypoxic respiratory failure on 2L at night, CAD s/p CABG and multiple PCI, HFpEF s/p CardioMEMS 01/16/2018, AF on Eliquis, cerebrovascular disease, HTN, CKD, DM2, PAD presenting with cough, feeling poorly for the past week with cough productive of sputum. She has been having intermittent emesis and post tussive emesis. She has not been able to take her medications because of her emesis.She was noted to be hypoxic to the 70s this afternoon when her daughter checked.   In the ED, pt febrile to 102.6 with SBP 170-209, HR 70s, O2 in the mid 80s. ECG showed sinus rhythm with LBBB-like IVCD. VBG 7.38/50. Labs included Cr 1.34 (at/improved from baseline), BNP 1480 (last 701 in 01/2018), troponin 0.05. Lactate 1.8. WBC 9.4. Flu panel negative. CXR showed cardiomegaly and bilateral infiltrates. She was intubated in the ED and started on IV aztreonam. IV furosemide was given, and NTG infusion was started for BP control.    Past Medical History  asthma, OSA not on CPAP, chronic hypoxic respiratory failure on 2L at night, CAD s/p CABG and multiple PCI, HFpEF s/p CardioMEMS, AF on Eliquis, cerebrovascular disease, HTN, CKD, DM2, PAD  Significant Hospital Events   2/22 > admit, intubated. Urine with e colii 2/23- echo EF 25-30% with global HK worse from mid-ventricle to apex. RV moderately down. (Echo 1/19 EF 55-60%) 2/25- NAEO. Improved nausea with phenergan. Transferrred and ccm signed off. Plat < 5k). HITT - negtive 2/26 - CT head - nil acute (for ITP - > Received 1 unit of platelets on 03/28/2018 and IVIG 1 mg/kg X 2 doses 03/28/2018 and 03/29/2018 2.27 - platelet upat 74K . Concerne is ITP (not TTP). Ongoing delirum but  improved. Cards recommendnig to limit sterods. Cardd ddx - acute IHD s-CHF v Takasubo. Maintained on amio for PAF. Rx with levaquin for CAP NPOS 2/28 extubated 2/29 re-intubated 3/3: intubated 03/25/2018 through 03/27/2018. Reintubated 04/01/2018. Etiology not entirely clear. Diffuse infiltrate with working diagnosis of pulmonary  edema vs infection vs inflammatory process. S/p treatment with antibiotics. Concern for amiodarone toxicity.  Started on Solu-Medrol overall she is improved.  Extubated 3/2 passed swallowing evaluation. 3/4: Now off from oxygen.  Clinically improved.  Delirium improving.  Glycemic control has been an issue overnight Consults:  Cardiology 2/22 Hematology  Procedures:  Intubated 2/22>2/24, 2/29 >> PA cath 3/1 >>out  Significant Diagnostic Tests:  CXR 2/22 > Cardiomegaly. Worsening bilateral airspace disease CXR 2/24> pulmonary edema pattern CXR 2/25> worsening RUL opacity  Autoimmune profile sent 3/1: ANA with reflex: Positive, ANCA titers negative Rheumatoid factor negative Cyclic citral antibodies: Negative  Micro Data:  BCx x 2 2/22 >> no growth 24 hours MRSA pcr 2/23 - neg  Resp cx 2/24 >> neg UCx 2/22 >> e coli  Influenza PCR 2/22 >> neg 2/29 blood >>> 2/29 URine > neg 2/29 > sputum neg  Antimicrobials:  Aztreonam 2/22 Levaquin 2/23 Vancomycin 2/23 Cefepime 2/23>>  Interim history/subjective:  No distress.   Objective   Blood pressure (Abnormal) 154/65, pulse (Abnormal) 54, temperature 98.5 F (36.9 C), resp. rate 19, height 5\' 2"  (1.575 m), weight 120.4 kg, SpO2 95 %. CVP:  [19 mmHg] 19 mmHg      Intake/Output Summary (Last 24  hours) at 04/07/2018 1526 Last data filed at 04/07/2018 1500 Gross per 24 hour  Intake 700 ml  Output 700 ml  Net 0 ml   Filed Weights   04/05/18 0500 04/06/18 0238 04/07/18 0500  Weight: 117.5 kg 120.4 kg 120.4 kg   General: No acute distress resting comfortably HEENT normocephalic atraumatic Pulmonary: Resting  comfortably no accessory use decreased bases Cardiac: Regular rate and rhythm Abdomen: Soft nontender Extremities: Warm and dry Neuro: Awake oriented more appropriate today    Septic and cardiogenic shock resolved 3/2 Assessment & Plan:   Acute hypoxemic respiratory failure in setting of diffuse pulmonary infiltrates: Etiology not entirely clear, question hypersensitivity pneumonitis possibly from amiodarone versus ARDS, vs flash edema in setting of takotsubo CM Successfully extubated 3/2 Improving clinically - off O2 Thus far autoimmune panel not impressive;  Plan Continue pulse oximetry Complete cefepime on 3/7 Aspiration precautions Prednisone, slow taper, would do this over about 4 weeks. (decrease 10 mg q7d) Diuresis per heart failure team Holding amiodarone Will need ambulatory pulse oximetry Will see her in office for f/u  Acute HFrEF; felt to have element of Takotsubo physiology:  LVEF 25%. She has extensive cardiac history, which is the likely culprit in the setting of critical illness, however, HF mentions some concern of Takotsubo. AFib  CAD s/p CABG  Plan Per cardiology    AKI on CKD -  Renal US 04/01/18 without hydro. -Renal function had improved, seems to have plateaued Plan Avoid hypotension Renal dose meds    Erick Colace, NP  Attending Note:  73 year old female with CHF who presents to PCCM with respiratory failure that was intubated and subsequently extubated.  On exam, lungs with bibasilar crackles.  Concern for amiodarone lung noted on a CT that I reviewed myself.  Discussed with PCCM-NP.  Acute respiratory failure:  - Monitor for O2 demand  - Monitor for airway protection  Hypoxemia:  - Titrate O2 for sat of 88-92%  - Will need an ambulatory desat study upon discharge for home O2 level  Amiodarone lung:  - Prednisone with a slow taper over a month  PNA:  - Cefepime with last dose in AM  PCCM will sign off, please call back if  needed.  Patient seen and examined, agree with above note.  I dictated the care and orders written for this patient under my direction.  Rush Farmer, [place]

## 2018-04-07 NOTE — Progress Notes (Signed)
Patient has order to ambulate in the hallway.  Patient transferred to the bedside commode with assist of nurse tech, but required 2 assist on transferring back to bed with a stand, pivot transfer.  Patient not able to safely ambulate in the hallway and according to PT note yesterday evening, safety as been a barrier.  Patient was going to get a bath this evening, but after the transfer to commode and back, she declined the bath at this time.

## 2018-04-07 NOTE — Progress Notes (Addendum)
Patient ID: [name] [name], female   DOB: Sep 06, 1945, 73 y.o.   [name]: [id number]    Advanced Heart Failure Rounding Note   Subjective:   Feeling much better. Denies SOB.     Echo EF 25-30% with global HK worse from mid-ventricle to apex. RV moderately down. (Echo 1/19 EF 55-60%)  CT chest w/o contrast: No evidence for ILD, patchy ground glass infiltrates noted bilaterally, PNA vs edema.   Objective:   Weight Range:  Vital Signs:   Temp:  [97.4 F (36.3 C)-98.5 F (36.9 C)] 97.5 F (36.4 C) (03/06 0840) Pulse Rate:  [42-69] 47 (03/06 0842) Resp:  [12-21] 18 (03/06 0840) BP: (115-162)/(50-128) 133/55 (03/06 0842) SpO2:  [93 %-99 %] 96 % (03/06 0842) Weight:  [120.4 kg] 120.4 kg (03/06 0500) Last BM Date: 03/31/18  Weight change: Filed Weights   04/05/18 0500 04/06/18 0238 04/07/18 0500  Weight: 117.5 kg 120.4 kg 120.4 kg    Intake/Output:   Intake/Output Summary (Last 24 hours) at 04/07/2018 0911 Last data filed at 04/07/2018 0452 Gross per 24 hour  Intake 100 ml  Output 700 ml  Net -600 ml     Physical Exam: CVP 6-7  General:  No resp difficulty. In bed.  HEENT: normal Neck: supple. JVP 6-7 . Carotids 2+ bilat; no bruits. No lymphadenopathy or thryomegaly appreciated. Cor: PMI nondisplaced. Regular rate & rhythm. No rubs, gallops or murmurs. Lungs: clear Abdomen: soft, nontender, nondistended. No hepatosplenomegaly. No bruits or masses. Good bowel sounds. Extremities: no cyanosis, clubbing, rash, edema. RUE PICC  Neuro: alert & orientedx3, cranial nerves grossly intact. moves all 4 extremities w/o difficulty. Affect pleasant   Telemetry: SB 40-50s  Labs: Basic Metabolic Panel: Recent Labs  Lab 04/02/18 0324 04/03/18 0444 04/04/18 0413 04/05/18 0321 04/06/18 0318 04/06/18 0319 04/07/18 0500  NA 139 144 147* 143 137  --  135  K 3.1* 3.9 3.2* 4.5 3.9  --  3.6  CL 91* 101 104 104 96*  --  93*  CO2 37* 30 33* 31 31  --  30  GLUCOSE 115* 182* 143* 286* 354*   --  242*  BUN 87* 48* 31* 32* 40*  --  59*  CREATININE 2.53* 1.65* 1.27* 1.36* 1.67*  --  2.06*  CALCIUM 8.8* 8.5* 8.9 9.2 8.7*  --  8.7*  MG 2.3 2.0 2.1 2.0  --  1.7  --   PHOS 2.1* 2.4* 1.8* 2.4* 4.3  --   --     Liver Function Tests: Recent Labs  Lab 04/06/18 0318  ALBUMIN 2.3*   No results for input(s): LIPASE, AMYLASE in the last 168 hours. No results for input(s): AMMONIA in the last 168 hours.  CBC: Recent Labs  Lab 04/01/18 1539  04/02/18 0324 04/03/18 0444 04/04/18 0413 04/05/18 0321 04/06/18 0319  WBC 14.9*  --  18.1* 15.9* 13.8* 14.3* 15.5*  NEUTROABS 12.3*  --   --  11.8* 10.5* 12.6* 14.1*  HGB 13.5   < > 12.6 11.7* 11.0* 11.5* 11.4*  HCT 44.1   < > 38.7 36.7 36.7 37.6 36.8  MCV 96.3  --  94.9 97.9 99.5 98.2 96.1  PLT 117*  --  136* 137* 139* 178 188   < > = values in this interval not displayed.    Cardiac Enzymes: Recent Labs  Lab 04/01/18 1539 04/02/18 0324 04/03/18 0444  TROPONINI 0.10* 0.13* 0.13*    BNP: BNP (last 3 results) Recent Labs    01/16/18 2150 03/25/18  1630  BNP 701.3* 1,480.1*    ProBNP (last 3 results) No results for input(s): PROBNP in the last 8760 hours.    Other results:  Imaging: No results found.   Medications:     Scheduled Medications: . ALPRAZolam  0.5 mg Oral BID  . apixaban  5 mg Oral BID  . bisacodyl  5 mg Oral Daily  . budesonide (PULMICORT) nebulizer solution  0.25 mg Nebulization BID  . buPROPion  150 mg Oral Daily  . Chlorhexidine Gluconate Cloth  6 each Topical Daily  . clopidogrel  75 mg Oral Daily  . docusate sodium  100 mg Oral Daily  . DULoxetine  30 mg Oral Daily  . ezetimibe  10 mg Oral QHS  . feeding supplement (GLUCERNA SHAKE)  237 mL Oral TID BM  . gabapentin  1,200 mg Oral QHS  . gabapentin  600 mg Oral BID  . insulin aspart  0-20 Units Subcutaneous TID WC  . insulin aspart  0-5 Units Subcutaneous QHS  . insulin aspart  6 Units Subcutaneous TID WC  . insulin glargine  20 Units  Subcutaneous QHS  . ipratropium-albuterol  3 mL Nebulization TID  . losartan  12.5 mg Oral Daily  . metoprolol succinate  50 mg Oral BID  . multivitamin  1 tablet Oral Daily  . pantoprazole  40 mg Oral Daily  . predniSONE  60 mg Oral Q breakfast  . rosuvastatin  40 mg Oral QHS  . sodium chloride flush  10-40 mL Intracatheter Q12H  . spironolactone  25 mg Oral Daily  . torsemide  40 mg Oral Daily  . vitamin B-12  1,000 mcg Oral Daily    Infusions: . sodium chloride Stopped (04/06/18 0200)  . ceFEPime (MAXIPIME) IV 1 g (04/06/18 1553)    PRN Medications: sodium chloride, hydrALAZINE, hydrocortisone cream, nitroGLYCERIN, ondansetron (ZOFRAN) IV, sodium chloride flush   Assessment:   [name] [name] is a 73 y.o. female with multiple medical problems including a hx of CAD s/p CABG and multiple PCI, HFpEF s/p CardioMEMS, AF, cerebrovascular disease, CKD, DM2, PAD, admitted 2/22 with acute respiratory failure requiring mechanical intubation in the setting of URI with fever to 102 and cough.   Plan/Discussion:    1. Acute hypoxemic respiratory failure: Suspect combination of pulmonary edema and PNA.  Initially intubated, now extubated.  Reintubated on 2/29. CXR with diffuse bilateral airspace disease and PCT 3.08=>2.39 => 0.26=>0.13.  Extubated 3/2.  CT chest- mild patchy foci in both lungs. 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  - cefepime ongoing.  - Now on prednisone 60 mg daily.  -Sats stable.  2. Shock: Suspect primarily septic shock, now resolved and off pressors.  She is now hypertensive.  3. ID: Suspect PNA.  Initial fever to 102.6 with high PCT.  Now afebrile.  Cultures so far negative.  - Empiric cefepime, day 6/7.  4. Acute systolic CHF: Echo with EF 25-30%, severe hypokinesis-akinesis of the mid-apical LV myocardium. Prior echo with normal EF.  ?Takotsubo event with stress of acute illness.  However, she has known extensive  coronary disease as well.  - Continue Toprol XL 50 mg bid.  - Continue spironolactone to 25 daily.  - Continue losartan 12.5 daily. No entresto for now with elevated creatinine.  Would not increase with creatinine trend.  - Volume status stable. CVP 6-7. Continue torsemide 40 mg daily. (she was on 100 mg bid at home).  5. AKI on CKD:  stage 3, baseline 1.4-1.8. Creatinine trending up to 2.  5. CAD: Last cath 9/19 with severe native CAD and LIMA patent. Sequential SVG-PLOM/OM1 with 80% proximal stenosis, s/p DES. SVG to RCA chronically occluded. No CP.  Mild elevation in troponin with no trend, likely demand ischemia. Echo with newly low EF, may be Takotsubo.  - Continue statin.  - Plavix restarted 2/26 with plts rising.  - Would eventually like to do angiography with fall in EF but creatinine up to 2 today.  - hold off for now.   6. Atrial fibrillation: Paroxysmal.   Maintaining NSR. Concern for amiodarone toxicity, off amiodarone.  - Continue apixaban 5 mg bid.   7. Thrombocytopenia: ?ITP triggered by acute infection.  Doubt TTP, no schistocytes/evidence for hemolysis.  HIT negative.  Got 2 units plts 2/25.  Given a dose of Solumedrol and 2 doses IVIG. Platelet count has now recovered.  8. Delerium: Resolved.   9. Deconditioning: PT with recommendations for short term rehab. She wants to go to [facility].   Amy Clegg NP-C  04/07/2018 9:11 AM   Patient seen with NP, agree with the above note.   Breathing has improved.  She is afebrile.   On exam, no JVD.  No edema.  Clear lungs.   She completes cefepime for empiric treatment of PNA today.  Amiodarone stopped with suspicition for amiodarone lung toxicity.  IV Solumedrol converted to prednisone, she will slowly taper steroids over a month.   Creatinine up to 2 today. I will stop losartan.  She already got torsemide today.  Will hold tomorrow's dose and probably resume on Sunday unless creatinine continues to rise.   Would hold  off on cath this admission, probably do as outpatient when creatinine stable at baseline.   Tentative plan for discharge to SNF Monday.   Loralie Champagne 04/07/2018 4:15 PM

## 2018-04-08 LAB — BASIC METABOLIC PANEL
Anion gap: 8 (ref 5–15)
BUN: 69 mg/dL — ABNORMAL HIGH (ref 8–23)
CO2: 34 mmol/L — ABNORMAL HIGH (ref 22–32)
Calcium: 8.5 mg/dL — ABNORMAL LOW (ref 8.9–10.3)
Chloride: 97 mmol/L — ABNORMAL LOW (ref 98–111)
Creatinine, Ser: 2.2 mg/dL — ABNORMAL HIGH (ref 0.44–1.00)
GFR calc Af Amer: 25 mL/min — ABNORMAL LOW (ref >60)
GFR calc non Af Amer: 22 mL/min — ABNORMAL LOW (ref >60)
Glucose, Bld: 208 mg/dL — ABNORMAL HIGH (ref 70–99)
Potassium: 3.2 mmol/L — ABNORMAL LOW (ref 3.5–5.1)
Sodium: 139 mmol/L (ref 135–145)

## 2018-04-08 LAB — GLUCOSE, CAPILLARY
Glucose-Capillary: 236 mg/dL — ABNORMAL HIGH (ref 70–99)
Glucose-Capillary: 229 mg/dL — ABNORMAL HIGH (ref 70–99)
Glucose-Capillary: 360 mg/dL — ABNORMAL HIGH (ref 70–99)
Glucose-Capillary: 152 mg/dL — ABNORMAL HIGH (ref 70–99)

## 2018-04-08 LAB — COOXEMETRY PANEL
Carboxyhemoglobin: 1.4 % (ref 0.5–1.5)
Methemoglobin: 2 % — ABNORMAL HIGH (ref 0.0–1.5)
O2 Saturation: 72.5 %
Total hemoglobin: 12.2 g/dL (ref 12.0–16.0)

## 2018-04-08 MED ORDER — METOPROLOL SUCCINATE ER 50 MG PO TB24
50.0000 mg | ORAL_TABLET | Freq: Every day | ORAL | Status: DC
  Administered 2018-04-08 – 2018-04-10 (×2): 50 mg via ORAL
  Filled 2018-04-08 (×2): qty 1

## 2018-04-08 MED ORDER — POTASSIUM CHLORIDE CRYS ER 20 MEQ PO TBCR
40.0000 meq | EXTENDED_RELEASE_TABLET | Freq: Once | ORAL | Status: AC
  Administered 2018-04-08: 40 meq via ORAL
  Filled 2018-04-08: qty 2

## 2018-04-08 NOTE — Progress Notes (Addendum)
End of shift daily summary:  0700 - Received report from miss Michele B.  0800 - Introduced self to patient who endorsed no questions/concerns  CVP inaccurate as reading (-)4, readjusted and reading 7  0930 - Medications given with the exception of MTP 39m which was changed to QDay d/t bradycardia in mid 40's and will start this evening.  Colace dulcolax held in the setting of loose stools.   1030 - Patient gotten from bed to chair with 1P assis             Education completed on HR fluid restrictions  1100 - Patient gotten to bedside commode x1 soft stoo  O2 remove --> patient saturations 97%  1300 - CVP checked --> 6  1552 - CVP checked --> 8  1609 - (+)1 small soft BM  Pulse rate in 60's --> MTP given  Peri-care performed  Patient assisted to bed with 1P assistance  New Purewick placed   1715 - Oral care performed  1845 - Call from monitor room that there is an absence of P-waves concern patient is going into junctional rhythm   Patients VSS - BP 151/59, HR 49, in no acute distress though does endorse some dizziness though denies chest pain/pressure  1850 - --> EKG done --> Dr. OMarthenia RollingPaged   [phone] Patient resting comfortably, dinner tray set up and call light placed w/in reach  Verified all needs had been met prior to end of shift

## 2018-04-08 NOTE — Progress Notes (Signed)
PROGRESS NOTE    ENVI EAGLESON  XLK:[id number]  DOB: [birth year]  DOA: 03/25/2018 PCP: Derinda Late, MD  Brief Narrative:   73 year old woman with history of asthma, OSA not on CPAP, chronic hypoxic respiratory failure on 2L at night, CAD s/p CABG and multiple PCI, HFpEF s/p CardioMEMS 01/16/2018, AF on Eliquis, cerebrovascular disease, HTN, CKD, DM2 and PAD.  Patient presented with 1 week history of productive cough and feeling poorly. She reported having intermittent emesis and post tussive emesis. Shewhas not able to take her medications because of her emesis.  Patient was noted to be hypoxic to the 82s when her daughter checked. In the ED, pt febrile to 102.6 with SBP 170-209, HR 70s, O2 in the mid 80s. ECG showed sinus rhythm with LBBB-like IVCD. VBG 7.38/50. Labs included Cr 1.34 (at/improved from baseline), BNP 1480 (last 701 in 01/2018), troponin 0.05. Lactate 1.8. WBC 9.4.Flu panel negative.CXR showed cardiomegaly and bilateral infiltrates. She was intubated in the ED and started on IV aztreonam. IV furosemide was given, and NTG infusion was started for BP control.  04/08/2018: No new complaints.  Awaiting disposition.  Discharge patient once a bed becomes available.  Subjective: No new complaints. No chest pain. No fever or chills.  Objective: Vitals:   04/08/18 0752 04/08/18 1127 04/08/18 1144 04/08/18 1607  BP: 131/60 (!) 129/55    Pulse: (!) 50 (!) 44  68  Resp: 20     Temp: 98.1 F (36.7 C) 97.6 F (36.4 C)    TempSrc: Oral Oral    SpO2: 90% 91% 94%   Weight:      Height:        Intake/Output Summary (Last 24 hours) at 04/08/2018 1648 Last data filed at 04/08/2018 1500 Gross per 24 hour  Intake 1120 ml  Output 900 ml  Net 220 ml   Filed Weights   04/06/18 0238 04/07/18 0500 04/08/18 0500  Weight: 120.4 kg 120.4 kg 123 kg    Physical Examination:  General exam: Appears calm and comfortable.  Patient is morbidly obese. Respiratory system: Clear to  auscultation.  Cardiovascular system: S1 & S2 heard Gastrointestinal system: Abdomen is obviously obese.  Abdomen is soft and nontender.  Organs are difficult to assess.   Central nervous system: Alert and oriented. No focal neurological deficits. Extremities: No leg edema.  Data Reviewed: I have personally reviewed following labs and imaging studies  CBC: Recent Labs  Lab 04/02/18 0324 04/03/18 0444 04/04/18 0413 04/05/18 0321 04/06/18 0319  WBC 18.1* 15.9* 13.8* 14.3* 15.5*  NEUTROABS  --  11.8* 10.5* 12.6* 14.1*  HGB 12.6 11.7* 11.0* 11.5* 11.4*  HCT 38.7 36.7 36.7 37.6 36.8  MCV 94.9 97.9 99.5 98.2 96.1  PLT 136* 137* 139* 178 366   Basic Metabolic Panel: Recent Labs  Lab 04/02/18 0324 04/03/18 0444 04/04/18 0413 04/05/18 0321 04/06/18 0318 04/06/18 0319 04/07/18 0500 04/08/18 0435  NA 139 144 147* 143 137  --  135 139  K 3.1* 3.9 3.2* 4.5 3.9  --  3.6 3.2*  CL 91* 101 104 104 96*  --  93* 97*  CO2 37* 30 33* 31 31  --  30 34*  GLUCOSE 115* 182* 143* 286* 354*  --  242* 208*  BUN 87* 48* 31* 32* 40*  --  59* 69*  CREATININE 2.53* 1.65* 1.27* 1.36* 1.67*  --  2.06* 2.20*  CALCIUM 8.8* 8.5* 8.9 9.2 8.7*  --  8.7* 8.5*  MG 2.3 2.0 2.1 2.0  --  1.7  --   --   PHOS 2.1* 2.4* 1.8* 2.4* 4.3  --   --   --    GFR: Estimated Creatinine Clearance: 29.8 mL/min (A) (by C-G formula based on SCr of 2.2 mg/dL (H)). Liver Function Tests: Recent Labs  Lab 04/06/18 0318  ALBUMIN 2.3*   No results for input(s): LIPASE, AMYLASE in the last 168 hours. No results for input(s): AMMONIA in the last 168 hours. Coagulation Profile: Recent Labs  Lab 04/03/18 0444  INR 1.3*   Cardiac Enzymes: Recent Labs  Lab 04/02/18 0324 04/03/18 0444  TROPONINI 0.13* 0.13*   BNP (last 3 results) No results for input(s): PROBNP in the last 8760 hours. HbA1C: No results for input(s): HGBA1C in the last 72 hours. CBG: Recent Labs  Lab 04/07/18 0718 04/07/18 1134 04/07/18 1603  04/07/18 2041 04/08/18 0755  GLUCAP 216* 204* 336* 280* 236*   Lipid Profile: No results for input(s): CHOL, HDL, LDLCALC, TRIG, CHOLHDL, LDLDIRECT in the last 72 hours. Thyroid Function Tests: No results for input(s): TSH, T4TOTAL, FREET4, T3FREE, THYROIDAB in the last 72 hours. Anemia Panel: No results for input(s): VITAMINB12, FOLATE, FERRITIN, TIBC, IRON, RETICCTPCT in the last 72 hours. Sepsis Labs: Recent Labs  Lab 04/02/18 0324 04/03/18 0444  PROCALCITON 0.26 0.13    Recent Results (from the past 240 hour(s))  Culture, blood (Routine X 2) w Reflex to ID Panel     Status: None   Collection Time: 04/01/18  4:30 PM  Result Value Ref Range Status   Specimen Description BLOOD SITE NOT SPECIFIED  Final   Special Requests   Final    BOTTLES DRAWN AEROBIC AND ANAEROBIC Blood Culture adequate volume   Culture   Final    NO GROWTH 5 DAYS Performed at [facility], [address] [place], [place] [address]    Report Status 04/06/2018 FINAL  Final  Culture, blood (Routine X 2) w Reflex to ID Panel     Status: None   Collection Time: 04/01/18  4:41 PM  Result Value Ref Range Status   Specimen Description BLOOD LEFT WRIST  Final   Special Requests AEROBIC BOTTLE ONLY Blood Culture adequate volume  Final   Culture   Final    NO GROWTH 5 DAYS Performed at [facility], [place] [address] [place], [place] [address]    Report Status 04/06/2018 FINAL  Final  Culture, Urine     Status: None   Collection Time: 04/01/18  5:05 PM  Result Value Ref Range Status   Specimen Description URINE, RANDOM  Final   Special Requests Immunocompromised  Final   Culture   Final    NO GROWTH Performed at [facility], [place] [address] [place], [place] [address]    Report Status 04/02/2018 FINAL  Final  Culture, respiratory (non-expectorated)     Status: None   Collection Time: 04/01/18  5:05 PM  Result Value Ref Range Status   Specimen Description TRACHEAL ASPIRATE  Final    Special Requests Immunocompromised  Final   Gram Stain   Final    RARE WBC PRESENT, PREDOMINANTLY MONONUCLEAR NO ORGANISMS SEEN    Culture   Final    NO GROWTH 2 DAYS Performed at [facility], [place] [address] [place], [place] [address]    Report Status 04/03/2018 FINAL  Final      Radiology Studies: No results found.      Scheduled Meds: . ALPRAZolam  0.5  mg Oral BID  . apixaban  5 mg Oral BID  . budesonide (PULMICORT) nebulizer solution  0.25 mg Nebulization BID  . buPROPion  150 mg Oral Daily  . Chlorhexidine Gluconate Cloth  6 each Topical Daily  . clopidogrel  75 mg Oral Daily  . DULoxetine  30 mg Oral Daily  . ezetimibe  10 mg Oral QHS  . feeding supplement (GLUCERNA SHAKE)  237 mL Oral TID BM  . gabapentin  1,200 mg Oral QHS  . gabapentin  600 mg Oral BID  . insulin aspart  0-20 Units Subcutaneous TID WC  . insulin aspart  0-5 Units Subcutaneous QHS  . insulin aspart  8 Units Subcutaneous TID WC  . insulin glargine  35 Units Subcutaneous QHS  . ipratropium-albuterol  3 mL Nebulization BID  . metoprolol succinate  50 mg Oral Daily  . multivitamin  1 tablet Oral Daily  . pantoprazole  40 mg Oral Daily  . predniSONE  40 mg Oral Q breakfast  . rosuvastatin  40 mg Oral QHS  . sodium chloride flush  10-40 mL Intracatheter Q12H  . spironolactone  25 mg Oral Daily  . vitamin B-12  1,000 mcg Oral Daily   Continuous Infusions: . sodium chloride Stopped (04/06/18 0200)    Assessment & Plan:    1.  Acute on chronic hypoxic respiratory failure: Felt to be multifactorial in the setting of underlying obstructive sleep apnea/possible obesity hypoventilation/diffuse pulmonary infiltrates on imaging studies suggesting pneumonia versus pulmonary edema versus amiodarone toxicity.  Patient extubated on March 2.  Pulmonary following along.  Noted EF of 25 to 30% (which is new compared to 55% in January 2019).  Patient treated for pneumonia with multiple antibiotics  including Levaquin, aztreonam, vancomycin and cefepime during ICU stay.  Currently off antibiotics except for cefepime day 7/7.  She improved with IV steroids and now on oral prednisone.  Pulmonary recommends slow taper over 4 weeks. Flu test and sputum cultures sent from ICU stay negative.Autoimmune work-up negative thus far.  Appreciate pulmonary follow-up and recommendations.    Hold amiodarone and continue diuretics.Will need O2 desat studies prior to discharge-likely will need continuous home O2 2 L 04/08/2018: Stable.  Awaiting disposition.  2.  New systolic cardiomyopathy with low EF of 25%: Presumed Takotsubo cardiomyopathy with global hypokinesis.  Right ventricular function moderately reduced as well.  She does have a history of CAD status post CABG.Continue diuretics (torsemide and Aldactone), beta-blockers, losartan.  Patient states her leg swellings are much improved.   Patient noted to be on weekly Zaroxolyn  in addition to torsemide as outpatient. 04/08/2018: Stable.  Continue current management.  3.  AKI on chronic kidney disease stage III: Creatinine now stable around 1.8.  Continue to monitor on diuretics.  Patient was on higher dose of torsemide at home. 04/08/2018: AKI continues to improve.  4.  Paroxysmal atrial fibrillation: Off amiodarone.  Continue anticoagulation and beta-blockers.  5.  CAD status post CABG:Continue Plavix/statins/beta-blockers.  Cardiac cath deferred by cardiology but will need outpatient follow-up.  Patient also on Ranexa as outpatient.  6.  Diabetes mellitus 2 with hyperglycemia:  Steroids likely worsening. Patient at baseline takes 18 to 26 units of Humalog 3 times a day before meals addition to Lantus 50 units at bedtime.  Resumed Lantus at a lower dose on March 5 given diet noncompliance at home, will increase dosage as blood glucose still high.  7.  Hyperlipidemia: Resume statins and Zetia.  8. Thrombocytopenia: Seen by hematology  and infection induced ITP  suspected.  Platelet count now improving.  She received IVIG 1 mg/kg daily for 2 days during ICU stay, and responded well.   9.  GERD: PPI  10.  Chronic back pain: Resume home medications  11.  Chronic insomnia: Resumed home medications (bupropion, Xanax and Cymbalta)  DVT prophylaxis: On anticoagulation Code Status: Full code Family / Patient Communication: Discussed with patient and answered all questions. Disposition Plan: Likely will need rehab.  Able to work with PT and with minimal O2 requirements.  Will downgrade to telemetry status.     LOS: 14 days    Time spent: 25 minutes  Bonnell Public, MD [facility] Pager [phone]   If 7PM-7AM, please contact night-coverage [email or web address] Password [facility] 04/08/2018, 4:47 PM

## 2018-04-08 NOTE — Progress Notes (Signed)
Patient ID: [name] [name], female   DOB: 07-22-1945, 73 y.o.   [name]: [id number]    Advanced Heart Failure Rounding Note   Subjective:    Feels weak, needs a lot of assistance to get out of bed.  CVP 7 today, co-ox 73%, creatinine up to 2.2. BP stable.   Echo EF 25-30% with global HK worse from mid-ventricle to apex. RV moderately down. (Echo 1/19 EF 55-60%)  CT chest w/o contrast: No evidence for ILD, patchy ground glass infiltrates noted bilaterally, PNA vs edema.   Objective:   Weight Range:  Vital Signs:   Temp:  [98 F (36.7 C)-98.5 F (36.9 C)] 98.1 F (36.7 C) (03/07 0752) Pulse Rate:  [49-54] 50 (03/07 0752) Resp:  [19-20] 20 (03/07 0752) BP: (115-154)/(60-95) 131/60 (03/07 0752) SpO2:  [90 %-98 %] 90 % (03/07 0752) Weight:  [123 kg] 123 kg (03/07 0500) Last BM Date: 04/07/18  Weight change: Filed Weights   04/06/18 0238 04/07/18 0500 04/08/18 0500  Weight: 120.4 kg 120.4 kg 123 kg    Intake/Output:   Intake/Output Summary (Last 24 hours) at 04/08/2018 0934 Last data filed at 04/07/2018 2222 Gross per 24 hour  Intake 1060 ml  Output 1750 ml  Net -690 ml     Physical Exam: CVP 7  General: NAD Neck: No JVD, no thyromegaly or thyroid nodule.  Lungs: Dependent crackles.  CV: Nondisplaced PMI.  Heart brady, regular S1/S2, no S3/S4, no murmur.  No peripheral edema.   Abdomen: Soft, nontender, no hepatosplenomegaly, no distention.  Skin: Intact without lesions or rashes.  Neurologic: Alert and oriented x 3.  Psych: Normal affect. Extremities: No clubbing or cyanosis.  HEENT: Normal.   Telemetry: SB 40s-50s   Labs: Basic Metabolic Panel: Recent Labs  Lab 04/02/18 0324 04/03/18 0444 04/04/18 0413 04/05/18 0321 04/06/18 0318 04/06/18 0319 04/07/18 0500 04/08/18 0435  NA 139 144 147* 143 137  --  135 139  K 3.1* 3.9 3.2* 4.5 3.9  --  3.6 3.2*  CL 91* 101 104 104 96*  --  93* 97*  CO2 37* 30 33* 31 31  --  30 34*  GLUCOSE 115* 182* 143* 286* 354*  --   242* 208*  BUN 87* 48* 31* 32* 40*  --  59* 69*  CREATININE 2.53* 1.65* 1.27* 1.36* 1.67*  --  2.06* 2.20*  CALCIUM 8.8* 8.5* 8.9 9.2 8.7*  --  8.7* 8.5*  MG 2.3 2.0 2.1 2.0  --  1.7  --   --   PHOS 2.1* 2.4* 1.8* 2.4* 4.3  --   --   --     Liver Function Tests: Recent Labs  Lab 04/06/18 0318  ALBUMIN 2.3*   No results for input(s): LIPASE, AMYLASE in the last 168 hours. No results for input(s): AMMONIA in the last 168 hours.  CBC: Recent Labs  Lab 04/01/18 1539  04/02/18 0324 04/03/18 0444 04/04/18 0413 04/05/18 0321 04/06/18 0319  WBC 14.9*  --  18.1* 15.9* 13.8* 14.3* 15.5*  NEUTROABS 12.3*  --   --  11.8* 10.5* 12.6* 14.1*  HGB 13.5   < > 12.6 11.7* 11.0* 11.5* 11.4*  HCT 44.1   < > 38.7 36.7 36.7 37.6 36.8  MCV 96.3  --  94.9 97.9 99.5 98.2 96.1  PLT 117*  --  136* 137* 139* 178 188   < > = values in this interval not displayed.    Cardiac Enzymes: Recent Labs  Lab 04/01/18 1539 04/02/18  0324 04/03/18 0444  TROPONINI 0.10* 0.13* 0.13*    BNP: BNP (last 3 results) Recent Labs    01/16/18 2150 03/25/18 1630  BNP 701.3* 1,480.1*    ProBNP (last 3 results) No results for input(s): PROBNP in the last 8760 hours.    Other results:  Imaging: No results found.   Medications:     Scheduled Medications: . ALPRAZolam  0.5 mg Oral BID  . apixaban  5 mg Oral BID  . bisacodyl  5 mg Oral Daily  . budesonide (PULMICORT) nebulizer solution  0.25 mg Nebulization BID  . buPROPion  150 mg Oral Daily  . Chlorhexidine Gluconate Cloth  6 each Topical Daily  . clopidogrel  75 mg Oral Daily  . docusate sodium  100 mg Oral Daily  . DULoxetine  30 mg Oral Daily  . ezetimibe  10 mg Oral QHS  . feeding supplement (GLUCERNA SHAKE)  237 mL Oral TID BM  . gabapentin  1,200 mg Oral QHS  . gabapentin  600 mg Oral BID  . insulin aspart  0-20 Units Subcutaneous TID WC  . insulin aspart  0-5 Units Subcutaneous QHS  . insulin aspart  8 Units Subcutaneous TID WC  .  insulin glargine  35 Units Subcutaneous QHS  . ipratropium-albuterol  3 mL Nebulization BID  . metoprolol succinate  50 mg Oral BID  . multivitamin  1 tablet Oral Daily  . pantoprazole  40 mg Oral Daily  . potassium chloride  40 mEq Oral Once  . predniSONE  40 mg Oral Q breakfast  . rosuvastatin  40 mg Oral QHS  . sodium chloride flush  10-40 mL Intracatheter Q12H  . spironolactone  25 mg Oral Daily  . vitamin B-12  1,000 mcg Oral Daily    Infusions: . sodium chloride Stopped (04/06/18 0200)    PRN Medications: sodium chloride, hydrALAZINE, hydrocortisone cream, nitroGLYCERIN, ondansetron (ZOFRAN) IV, sodium chloride flush   Assessment:   [name] [name] is a 73 y.o. female with multiple medical problems including a hx of CAD s/p CABG and multiple PCI, HFpEF s/p CardioMEMS, AF, cerebrovascular disease, CKD, DM2, PAD, admitted 2/22 with acute respiratory failure requiring mechanical intubation in the setting of URI with fever to 102 and cough.   Plan/Discussion:    1. Acute hypoxemic respiratory failure: Suspect combination of pulmonary edema and PNA.  Initially intubated, now extubated.  Reintubated on 2/29. CXR with diffuse bilateral airspace disease and PCT 3.08=>2.39 => 0.26=>0.13.  Extubated 3/2.  CT chest- mild patchy foci in both lungs. 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  She has now completed cefepime treatment for PNA.  - Prednisone taper over a month per CCM note.  2. Shock: Suspect primarily septic shock, now resolved and off pressors.    3. ID: Suspect PNA.  Initial fever to 102.6 with high PCT.  Now afebrile.  Cultures so far negative. She has completed course of cefepime.  4. Acute systolic CHF: Echo with EF 25-30%, severe hypokinesis-akinesis of the mid-apical LV myocardium. Prior echo with normal EF.  ?Takotsubo event with stress of acute illness.  However, she has known extensive coronary disease as well. Creatinine  rising, CVP 7, good co-ox.  - With bradycardia, decrease Toprol XL to once daily.   - Continue spironolactone to 25 daily.  - Off losartan with creatinine rise.  - Stop torsemide with creatinine rise.   5. AKI on CKD: stage 3, baseline 1.4-1.8. Creatinine trending up  to 2.2. - Off losartan.  - Stop torsemide.   6. CAD: Last cath 9/19 with severe native CAD and LIMA patent. Sequential SVG-PLOM/OM1 with 80% proximal stenosis, s/p DES. SVG to RCA chronically occluded. No CP.  Mild elevation in troponin with no trend, likely demand ischemia. Echo with newly low EF, may be Takotsubo.  - Continue statin.  - Plavix restarted 2/26 with plts rising.  - Would eventually like to do angiography with fall in EF but holding off for now with AKI.  Probably will aim to do at some point as outpatient when more stable.   7. Atrial fibrillation: Paroxysmal.  Maintaining NSR. Concern for amiodarone toxicity, off amiodarone.  - Continue apixaban 5 mg bid.   8. Thrombocytopenia: ?ITP triggered by acute infection.  Doubt TTP, no schistocytes/evidence for hemolysis.  HIT negative.  Got 2 units plts 2/25.  Given a dose of Solumedrol and 2 doses IVIG. Platelet count has now recovered.  9. Delirium: Resolved.   10. Deconditioning: PT with recommendations for short term rehab. She wants to go to [facility].   Loralie Champagne 04/08/2018 9:34 AM

## 2018-04-09 LAB — COOXEMETRY PANEL
Carboxyhemoglobin: 1.4 % (ref 0.5–1.5)
Methemoglobin: 0.9 % (ref 0.0–1.5)
O2 Saturation: 50.3 %
Total hemoglobin: 13.3 g/dL (ref 12.0–16.0)

## 2018-04-09 LAB — GLUCOSE, CAPILLARY
Glucose-Capillary: 93 mg/dL (ref 70–99)
Glucose-Capillary: 126 mg/dL — ABNORMAL HIGH (ref 70–99)
Glucose-Capillary: 290 mg/dL — ABNORMAL HIGH (ref 70–99)
Glucose-Capillary: 324 mg/dL — ABNORMAL HIGH (ref 70–99)

## 2018-04-09 LAB — BASIC METABOLIC PANEL
Anion gap: 9 (ref 5–15)
BUN: 66 mg/dL — ABNORMAL HIGH (ref 8–23)
CO2: 34 mmol/L — ABNORMAL HIGH (ref 22–32)
Calcium: 8.8 mg/dL — ABNORMAL LOW (ref 8.9–10.3)
Chloride: 97 mmol/L — ABNORMAL LOW (ref 98–111)
Creatinine, Ser: 1.72 mg/dL — ABNORMAL HIGH (ref 0.44–1.00)
GFR calc Af Amer: 34 mL/min — ABNORMAL LOW (ref >60)
GFR calc non Af Amer: 30 mL/min — ABNORMAL LOW (ref >60)
Glucose, Bld: 105 mg/dL — ABNORMAL HIGH (ref 70–99)
Potassium: 3.3 mmol/L — ABNORMAL LOW (ref 3.5–5.1)
Sodium: 140 mmol/L (ref 135–145)

## 2018-04-09 MED ORDER — POTASSIUM CHLORIDE CRYS ER 20 MEQ PO TBCR
40.0000 meq | EXTENDED_RELEASE_TABLET | Freq: Once | ORAL | Status: AC
  Administered 2018-04-09: 40 meq via ORAL
  Filled 2018-04-09: qty 2

## 2018-04-09 NOTE — Progress Notes (Signed)
Patient ID: [name] [name], female   DOB: April 14, 1945, 73 y.o.   [name]: [id number]    Advanced Heart Failure Rounding Note   Subjective:    Doing ok today, no complaints.  Creatinine down to 1.72.  HR in upper 40s-50s still.  CVP 6-7.   Echo EF 25-30% with global HK worse from mid-ventricle to apex. RV moderately down. (Echo 1/19 EF 55-60%)  CT chest w/o contrast: No evidence for ILD, patchy ground glass infiltrates noted bilaterally, PNA vs edema.   Objective:   Weight Range:  Vital Signs:   Temp:  [97.3 F (36.3 C)-97.6 F (36.4 C)] 97.5 F (36.4 C) (03/08 0817) Pulse Rate:  [44-68] 48 (03/08 0817) Resp:  [16-24] 16 (03/08 0817) BP: (113-158)/(54-77) 113/77 (03/08 0817) SpO2:  [90 %-98 %] 98 % (03/08 0817) Weight:  [122.2 kg] 122.2 kg (03/08 0500) Last BM Date: 04/08/18  Weight change: Filed Weights   04/07/18 0500 04/08/18 0500 04/09/18 0500  Weight: 120.4 kg 123 kg 122.2 kg    Intake/Output:   Intake/Output Summary (Last 24 hours) at 04/09/2018 0932 Last data filed at 04/09/2018 0853 Gross per 24 hour  Intake 550 ml  Output -  Net 550 ml     Physical Exam: CVP 6-7  General: NAD Neck: No JVD, no thyromegaly or thyroid nodule.  Lungs: Clear to auscultation bilaterally with normal respiratory effort. CV: Nondisplaced PMI.  Heart regular S1/S2, no S3/S4, no murmur.  No peripheral edema.   Abdomen: Soft, nontender, no hepatosplenomegaly, no distention.  Skin: Intact without lesions or rashes.  Neurologic: Alert and oriented x 3.  Psych: Normal affect. Extremities: No clubbing or cyanosis.  HEENT: Normal.   Telemetry: SB upper 40s-50s   Labs: Basic Metabolic Panel: Recent Labs  Lab 04/03/18 0444 04/04/18 0413 04/05/18 0321 04/06/18 0318 04/06/18 0319 04/07/18 0500 04/08/18 0435 04/09/18 0445  NA 144 147* 143 137  --  135 139 140  K 3.9 3.2* 4.5 3.9  --  3.6 3.2* 3.3*  CL 101 104 104 96*  --  93* 97* 97*  CO2 30 33* 31 31  --  30 34* 34*  GLUCOSE 182*  143* 286* 354*  --  242* 208* 105*  BUN 48* 31* 32* 40*  --  59* 69* 66*  CREATININE 1.65* 1.27* 1.36* 1.67*  --  2.06* 2.20* 1.72*  CALCIUM 8.5* 8.9 9.2 8.7*  --  8.7* 8.5* 8.8*  MG 2.0 2.1 2.0  --  1.7  --   --   --   PHOS 2.4* 1.8* 2.4* 4.3  --   --   --   --     Liver Function Tests: Recent Labs  Lab 04/06/18 0318  ALBUMIN 2.3*   No results for input(s): LIPASE, AMYLASE in the last 168 hours. No results for input(s): AMMONIA in the last 168 hours.  CBC: Recent Labs  Lab 04/03/18 0444 04/04/18 0413 04/05/18 0321 04/06/18 0319  WBC 15.9* 13.8* 14.3* 15.5*  NEUTROABS 11.8* 10.5* 12.6* 14.1*  HGB 11.7* 11.0* 11.5* 11.4*  HCT 36.7 36.7 37.6 36.8  MCV 97.9 99.5 98.2 96.1  PLT 137* 139* 178 188    Cardiac Enzymes: Recent Labs  Lab 04/03/18 0444  TROPONINI 0.13*    BNP: BNP (last 3 results) Recent Labs    01/16/18 2150 03/25/18 1630  BNP 701.3* 1,480.1*    ProBNP (last 3 results) No results for input(s): PROBNP in the last 8760 hours.    Other results:  Imaging:  No results found.   Medications:     Scheduled Medications: . ALPRAZolam  0.5 mg Oral BID  . apixaban  5 mg Oral BID  . budesonide (PULMICORT) nebulizer solution  0.25 mg Nebulization BID  . buPROPion  150 mg Oral Daily  . Chlorhexidine Gluconate Cloth  6 each Topical Daily  . clopidogrel  75 mg Oral Daily  . DULoxetine  30 mg Oral Daily  . ezetimibe  10 mg Oral QHS  . feeding supplement (GLUCERNA SHAKE)  237 mL Oral TID BM  . gabapentin  1,200 mg Oral QHS  . gabapentin  600 mg Oral BID  . insulin aspart  0-20 Units Subcutaneous TID WC  . insulin aspart  0-5 Units Subcutaneous QHS  . insulin aspart  8 Units Subcutaneous TID WC  . insulin glargine  35 Units Subcutaneous QHS  . ipratropium-albuterol  3 mL Nebulization BID  . metoprolol succinate  50 mg Oral Daily  . multivitamin  1 tablet Oral Daily  . pantoprazole  40 mg Oral Daily  . potassium chloride  40 mEq Oral Once  .  predniSONE  40 mg Oral Q breakfast  . rosuvastatin  40 mg Oral QHS  . sodium chloride flush  10-40 mL Intracatheter Q12H  . spironolactone  25 mg Oral Daily  . vitamin B-12  1,000 mcg Oral Daily    Infusions: . sodium chloride Stopped (04/06/18 0200)    PRN Medications: sodium chloride, hydrALAZINE, hydrocortisone cream, nitroGLYCERIN, ondansetron (ZOFRAN) IV, sodium chloride flush   Assessment:   [name] [name] is a 73 y.o. female with multiple medical problems including a hx of CAD s/p CABG and multiple PCI, HFpEF s/p CardioMEMS, AF, cerebrovascular disease, CKD, DM2, PAD, admitted 2/22 with acute respiratory failure requiring mechanical intubation in the setting of URI with fever to 102 and cough.   Plan/Discussion:    1. Acute hypoxemic respiratory failure: Suspect combination of pulmonary edema and PNA.  Initially intubated, now extubated.  Reintubated on 2/29. CXR with diffuse bilateral airspace disease and PCT 3.08=>2.39 => 0.26=>0.13.  Extubated 3/2.  CT chest- mild patchy foci in both lungs. 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  She has now completed cefepime treatment for PNA.  - Prednisone taper over a month per CCM note.  2. Shock: Suspect primarily septic shock, now resolved and off pressors.    3. ID: Suspect PNA.  Initial fever to 102.6 with high PCT.  Now afebrile.  Cultures so far negative. She has completed course of cefepime.  4. Acute systolic CHF: Echo with EF 25-30%, severe hypokinesis-akinesis of the mid-apical LV myocardium. Prior echo with normal EF.  ?Takotsubo event with stress of acute illness.  However, she has known extensive coronary disease as well.  CVP 6-7 today, creatinine trending down.  - With bradycardia, decreased Toprol XL to once daily.  Will get ECG today.  - Continue spironolactone to 25 daily.  - Off losartan with creatinine rise.  - Holding torsemide with creatinine rise.  Will eventually need at  least a low dose.  5. AKI on CKD: stage 3, baseline 1.4-1.8. Creatinine trending down to 2.2 => 1.72. - Off losartan.  - Holding torsemide.   6. CAD: Last cath 9/19 with severe native CAD and LIMA patent. Sequential SVG-PLOM/OM1 with 80% proximal stenosis, s/p DES. SVG to RCA chronically occluded. No CP.  Mild elevation in troponin with no trend, likely demand ischemia. Echo with newly low EF, may  be Takotsubo.  - Continue statin.  - Plavix restarted 2/26 with plts rising.  - Would eventually like to do angiography with fall in EF but holding off for now with AKI.  Probably will aim to do at some point as outpatient when more stable.   7. Atrial fibrillation: Paroxysmal.  Maintaining NSR. Concern for amiodarone toxicity, off amiodarone.  - Continue apixaban 5 mg bid.   8. Thrombocytopenia: ?ITP triggered by acute infection.  Doubt TTP, no schistocytes/evidence for hemolysis.  HIT negative.  Got 2 units plts 2/25.  Given a dose of Solumedrol and 2 doses IVIG. Platelet count has now recovered.  9. Delirium: Resolved.   10. Deconditioning: PT with recommendations for short term rehab. She wants to go to [facility], should be ready on Monday.   Loralie Champagne 04/09/2018 9:32 AM

## 2018-04-09 NOTE — Progress Notes (Signed)
PROGRESS NOTE    [name] [name]  SHF:[id number]  DOB: Jun 14, 1945  DOA: 03/25/2018 PCP: Derinda Late, MD  Brief Narrative:   73 year old woman with history of asthma, OSA not on CPAP, chronic hypoxic respiratory failure on 2L at night, CAD s/p CABG and multiple PCI, HFpEF s/p CardioMEMS 01/16/2018, AF on Eliquis, cerebrovascular disease, HTN, CKD, DM2 and PAD.  Patient presented with 1 week history of productive cough and feeling poorly. She reported having intermittent emesis and post tussive emesis. Shewhas not able to take her medications because of her emesis.  Patient was noted to be hypoxic to the 55s when her daughter checked. In the ED, pt febrile to 102.6 with SBP 170-209, HR 70s, O2 in the mid 80s. ECG showed sinus rhythm with LBBB-like IVCD. VBG 7.38/50. Labs included Cr 1.34 (at/improved from baseline), BNP 1480 (last 701 in 01/2018), troponin 0.05. Lactate 1.8. WBC 9.4.Flu panel negative.CXR showed cardiomegaly and bilateral infiltrates. She was intubated in the ED and started on IV aztreonam. IV furosemide was given, and NTG infusion was started for BP control.  04/09/2018: No new complaints.  Awaiting disposition.  Discharge patient once a bed becomes available.  Subjective: No new complaints. No chest pain. No fever or chills.  Objective: Vitals:   04/09/18 0811 04/09/18 0813 04/09/18 0817 04/09/18 1159  BP:   113/77 (!) 125/53  Pulse:  (!) 45 (!) 48 (!) 51  Resp:  16 16 16   Temp:   (!) 97.5 F (36.4 C) 97.8 F (36.6 C)  TempSrc:   Axillary Oral  SpO2: 94% 94% 98% 91%  Weight:      Height:        Intake/Output Summary (Last 24 hours) at 04/09/2018 1744 Last data filed at 04/09/2018 0900 Gross per 24 hour  Intake 490 ml  Output -  Net 490 ml   Filed Weights   04/07/18 0500 04/08/18 0500 04/09/18 0500  Weight: 120.4 kg 123 kg 122.2 kg    Physical Examination:  General exam: Appears calm and comfortable.  Patient is morbidly obese. Respiratory system:  Clear to auscultation.  Cardiovascular system: S1 & S2 heard Gastrointestinal system: Abdomen is obviously obese.  Abdomen is soft and nontender.  Organs are difficult to assess.   Central nervous system: Alert and oriented. No focal neurological deficits. Extremities: No leg edema.  Data Reviewed: I have personally reviewed following labs and imaging studies  CBC: Recent Labs  Lab 04/03/18 0444 04/04/18 0413 04/05/18 0321 04/06/18 0319  WBC 15.9* 13.8* 14.3* 15.5*  NEUTROABS 11.8* 10.5* 12.6* 14.1*  HGB 11.7* 11.0* 11.5* 11.4*  HCT 36.7 36.7 37.6 36.8  MCV 97.9 99.5 98.2 96.1  PLT 137* 139* 178 502   Basic Metabolic Panel: Recent Labs  Lab 04/03/18 0444 04/04/18 0413 04/05/18 0321 04/06/18 0318 04/06/18 0319 04/07/18 0500 04/08/18 0435 04/09/18 0445  NA 144 147* 143 137  --  135 139 140  K 3.9 3.2* 4.5 3.9  --  3.6 3.2* 3.3*  CL 101 104 104 96*  --  93* 97* 97*  CO2 30 33* 31 31  --  30 34* 34*  GLUCOSE 182* 143* 286* 354*  --  242* 208* 105*  BUN 48* 31* 32* 40*  --  59* 69* 66*  CREATININE 1.65* 1.27* 1.36* 1.67*  --  2.06* 2.20* 1.72*  CALCIUM 8.5* 8.9 9.2 8.7*  --  8.7* 8.5* 8.8*  MG 2.0 2.1 2.0  --  1.7  --   --   --  PHOS 2.4* 1.8* 2.4* 4.3  --   --   --   --    GFR: Estimated Creatinine Clearance: 37.9 mL/min (A) (by C-G formula based on SCr of 1.72 mg/dL (H)). Liver Function Tests: Recent Labs  Lab 04/06/18 0318  ALBUMIN 2.3*   No results for input(s): LIPASE, AMYLASE in the last 168 hours. No results for input(s): AMMONIA in the last 168 hours. Coagulation Profile: Recent Labs  Lab 04/03/18 0444  INR 1.3*   Cardiac Enzymes: Recent Labs  Lab 04/03/18 0444  TROPONINI 0.13*   BNP (last 3 results) No results for input(s): PROBNP in the last 8760 hours. HbA1C: No results for input(s): HGBA1C in the last 72 hours. CBG: Recent Labs  Lab 04/08/18 1126 04/08/18 1742 04/08/18 2119 04/09/18 0816 04/09/18 1158  GLUCAP 229* 360* 152* 93 126*    Lipid Profile: No results for input(s): CHOL, HDL, LDLCALC, TRIG, CHOLHDL, LDLDIRECT in the last 72 hours. Thyroid Function Tests: No results for input(s): TSH, T4TOTAL, FREET4, T3FREE, THYROIDAB in the last 72 hours. Anemia Panel: No results for input(s): VITAMINB12, FOLATE, FERRITIN, TIBC, IRON, RETICCTPCT in the last 72 hours. Sepsis Labs: Recent Labs  Lab 04/03/18 0444  PROCALCITON 0.13    Recent Results (from the past 240 hour(s))  Culture, blood (Routine X 2) w Reflex to ID Panel     Status: None   Collection Time: 04/01/18  4:30 PM  Result Value Ref Range Status   Specimen Description BLOOD SITE NOT SPECIFIED  Final   Special Requests   Final    BOTTLES DRAWN AEROBIC AND ANAEROBIC Blood Culture adequate volume   Culture   Final    NO GROWTH 5 DAYS Performed at [facility], [address], [place], [place] [address]    Report Status 04/06/2018 FINAL  Final  Culture, blood (Routine X 2) w Reflex to ID Panel     Status: None   Collection Time: 04/01/18  4:41 PM  Result Value Ref Range Status   Specimen Description BLOOD LEFT WRIST  Final   Special Requests AEROBIC BOTTLE ONLY Blood Culture adequate volume  Final   Culture   Final    NO GROWTH 5 DAYS Performed at [facility], [place] [address] [place], [place] [address]    Report Status 04/06/2018 FINAL  Final  Culture, Urine     Status: None   Collection Time: 04/01/18  5:05 PM  Result Value Ref Range Status   Specimen Description URINE, RANDOM  Final   Special Requests Immunocompromised  Final   Culture   Final    NO GROWTH Performed at [facility], [place] [address], [place], [place] [address]    Report Status 04/02/2018 FINAL  Final  Culture, respiratory (non-expectorated)     Status: None   Collection Time: 04/01/18  5:05 PM  Result Value Ref Range Status   Specimen Description TRACHEAL ASPIRATE  Final   Special Requests Immunocompromised  Final   Gram Stain   Final    RARE WBC PRESENT,  PREDOMINANTLY MONONUCLEAR NO ORGANISMS SEEN    Culture   Final    NO GROWTH 2 DAYS Performed at [facility], [place] [address], [place], [place] [address]    Report Status 04/03/2018 FINAL  Final      Radiology Studies: No results found.      Scheduled Meds: . ALPRAZolam  0.5 mg Oral BID  . apixaban  5 mg Oral BID  .  budesonide (PULMICORT) nebulizer solution  0.25 mg Nebulization BID  . buPROPion  150 mg Oral Daily  . Chlorhexidine Gluconate Cloth  6 each Topical Daily  . clopidogrel  75 mg Oral Daily  . DULoxetine  30 mg Oral Daily  . ezetimibe  10 mg Oral QHS  . feeding supplement (GLUCERNA SHAKE)  237 mL Oral TID BM  . gabapentin  1,200 mg Oral QHS  . gabapentin  600 mg Oral BID  . insulin aspart  0-20 Units Subcutaneous TID WC  . insulin aspart  0-5 Units Subcutaneous QHS  . insulin aspart  8 Units Subcutaneous TID WC  . insulin glargine  35 Units Subcutaneous QHS  . ipratropium-albuterol  3 mL Nebulization BID  . metoprolol succinate  50 mg Oral Daily  . multivitamin  1 tablet Oral Daily  . pantoprazole  40 mg Oral Daily  . potassium chloride  40 mEq Oral Once  . predniSONE  40 mg Oral Q breakfast  . rosuvastatin  40 mg Oral QHS  . sodium chloride flush  10-40 mL Intracatheter Q12H  . spironolactone  25 mg Oral Daily  . vitamin B-12  1,000 mcg Oral Daily   Continuous Infusions: . sodium chloride Stopped (04/06/18 0200)    Assessment & Plan:    1.  Acute on chronic hypoxic respiratory failure: Felt to be multifactorial in the setting of underlying obstructive sleep apnea/possible obesity hypoventilation/diffuse pulmonary infiltrates on imaging studies suggesting pneumonia versus pulmonary edema versus amiodarone toxicity.  Patient extubated on March 2.  Pulmonary following along.  Noted EF of 25 to 30% (which is new compared to 55% in January 2019).  Patient treated for pneumonia with multiple antibiotics including Levaquin, aztreonam, vancomycin and  cefepime during ICU stay.  Currently off antibiotics except for cefepime day 7/7.  She improved with IV steroids and now on oral prednisone.  Pulmonary recommends slow taper over 4 weeks. Flu test and sputum cultures sent from ICU stay negative.Autoimmune work-up negative thus far.  Appreciate pulmonary follow-up and recommendations.    Hold amiodarone and continue diuretics.Will need O2 desat studies prior to discharge-likely will need continuous home O2 2 L 04/09/2018: Stable.  Awaiting disposition.  2.  New systolic cardiomyopathy with low EF of 25%: Presumed Takotsubo cardiomyopathy with global hypokinesis.  Right ventricular function moderately reduced as well.  She does have a history of CAD status post CABG.Continue diuretics (torsemide and Aldactone), beta-blockers, losartan.  Patient states her leg swellings are much improved.   Patient noted to be on weekly Zaroxolyn  in addition to torsemide as outpatient. 04/09/2018: Stable.  Continue current management.  3.  AKI on chronic kidney disease stage III: Creatinine now stable around 1.8.  Continue to monitor on diuretics.  Patient was on higher dose of torsemide at home. 04/09/2018: AKI continues to improve.  4.  Paroxysmal atrial fibrillation: Off amiodarone.  Continue anticoagulation and beta-blockers.  5.  CAD status post CABG:Continue Plavix/statins/beta-blockers.  Cardiac cath deferred by cardiology but will need outpatient follow-up.  Patient also on Ranexa as outpatient.  6.  Diabetes mellitus 2 with hyperglycemia:  Steroids likely worsening. Patient at baseline takes 18 to 26 units of Humalog 3 times a day before meals addition to Lantus 50 units at bedtime.  Resumed Lantus at a lower dose on March 5 given diet noncompliance at home, will increase dosage as blood glucose still high.  7.  Hyperlipidemia: Resume statins and Zetia.  8. Thrombocytopenia: Seen by hematology and infection induced ITP  suspected.  Platelet count now improving.  She  received IVIG 1 mg/kg daily for 2 days during ICU stay, and responded well.   9.  GERD: PPI  10.  Chronic back pain: Resume home medications  11.  Chronic insomnia: Resumed home medications (bupropion, Xanax and Cymbalta)  DVT prophylaxis: On anticoagulation Code Status: Full code Family / Patient Communication: Discussed with patient and answered all questions. Disposition Plan: Likely will need rehab.  Able to work with PT and with minimal O2 requirements.  Will downgrade to telemetry status.     LOS: 15 days    Time spent: 25 minutes  Bonnell Public, MD [facility] Pager [phone]   If 7PM-7AM, please contact night-coverage [email or web address] Password [facility] 04/09/2018, 5:44 PM

## 2018-04-10 ENCOUNTER — Ambulatory Visit (HOSPITAL_COMMUNITY): Payer: Medicare Other

## 2018-04-10 LAB — COOXEMETRY PANEL
Carboxyhemoglobin: 1.9 % — ABNORMAL HIGH (ref 0.5–1.5)
Methemoglobin: 1 % (ref 0.0–1.5)
O2 Saturation: 81.9 %
Total hemoglobin: 13.3 g/dL (ref 12.0–16.0)

## 2018-04-10 LAB — RENAL FUNCTION PANEL
Albumin: 2.6 g/dL — ABNORMAL LOW (ref 3.5–5.0)
Anion gap: 7 (ref 5–15)
BUN: 48 mg/dL — ABNORMAL HIGH (ref 8–23)
CO2: 31 mmol/L (ref 22–32)
Calcium: 8.9 mg/dL (ref 8.9–10.3)
Chloride: 102 mmol/L (ref 98–111)
Creatinine, Ser: 1.39 mg/dL — ABNORMAL HIGH (ref 0.44–1.00)
GFR calc Af Amer: 44 mL/min — ABNORMAL LOW (ref >60)
GFR calc non Af Amer: 38 mL/min — ABNORMAL LOW (ref >60)
Glucose, Bld: 187 mg/dL — ABNORMAL HIGH (ref 70–99)
Phosphorus: 2.9 mg/dL (ref 2.5–4.6)
Potassium: 4.8 mmol/L (ref 3.5–5.1)
Sodium: 140 mmol/L (ref 135–145)

## 2018-04-10 LAB — CBC WITH DIFFERENTIAL/PLATELET
Abs Immature Granulocytes: 0.41 K/uL — ABNORMAL HIGH (ref 0.00–0.07)
Basophils Absolute: 0 K/uL (ref 0.0–0.1)
Basophils Relative: 0 %
Eosinophils Absolute: 0 K/uL (ref 0.0–0.5)
Eosinophils Relative: 0 %
HCT: 40.1 % (ref 36.0–46.0)
Hemoglobin: 12.6 g/dL (ref 12.0–15.0)
Immature Granulocytes: 2 %
Lymphocytes Relative: 7 %
Lymphs Abs: 1.6 K/uL (ref 0.7–4.0)
MCH: 29.9 pg (ref 26.0–34.0)
MCHC: 31.4 g/dL (ref 30.0–36.0)
MCV: 95.2 fL (ref 80.0–100.0)
Monocytes Absolute: 1.2 K/uL — ABNORMAL HIGH (ref 0.1–1.0)
Monocytes Relative: 5 %
Neutro Abs: 19.4 K/uL — ABNORMAL HIGH (ref 1.7–7.7)
Neutrophils Relative %: 86 %
Platelets: 226 K/uL (ref 150–400)
RBC: 4.21 MIL/uL (ref 3.87–5.11)
RDW: 13.7 % (ref 11.5–15.5)
WBC: 22.6 K/uL — ABNORMAL HIGH (ref 4.0–10.5)
nRBC: 0 % (ref 0.0–0.2)

## 2018-04-10 LAB — MAGNESIUM: Magnesium: 2.1 mg/dL (ref 1.7–2.4)

## 2018-04-10 LAB — GLUCOSE, CAPILLARY
Glucose-Capillary: 186 mg/dL — ABNORMAL HIGH (ref 70–99)
Glucose-Capillary: 251 mg/dL — ABNORMAL HIGH (ref 70–99)
Glucose-Capillary: 304 mg/dL — ABNORMAL HIGH (ref 70–99)

## 2018-04-10 LAB — BRAIN NATRIURETIC PEPTIDE: B Natriuretic Peptide: 149.9 pg/mL — ABNORMAL HIGH (ref 0.0–100.0)

## 2018-04-10 MED ORDER — METOPROLOL SUCCINATE ER 50 MG PO TB24: 50.0000 mg | ORAL_TABLET | Freq: Every day | ORAL | Status: DC

## 2018-04-10 MED ORDER — DULOXETINE HCL 30 MG PO CPEP: 30.0000 mg | ORAL_CAPSULE | Freq: Every day | ORAL | 3 refills | Status: DC

## 2018-04-10 MED ORDER — INSULIN GLARGINE 100 UNIT/ML ~~LOC~~ SOLN: 35.0000 [IU] | Freq: Every day | SUBCUTANEOUS | 11 refills | Status: DC

## 2018-04-10 MED ORDER — INSULIN ASPART 100 UNIT/ML ~~LOC~~ SOLN: 8.0000 [IU] | Freq: Three times a day (TID) | SUBCUTANEOUS | 11 refills | Status: DC

## 2018-04-10 MED ORDER — TORSEMIDE 20 MG PO TABS: 20.0000 mg | ORAL_TABLET | Freq: Every day | ORAL | Status: DC

## 2018-04-10 MED ORDER — BUDESONIDE 0.25 MG/2ML IN SUSP: 0.2500 mg | Freq: Two times a day (BID) | RESPIRATORY_TRACT | 12 refills | Status: DC

## 2018-04-10 MED ORDER — SPIRONOLACTONE 25 MG PO TABS: 25.0000 mg | ORAL_TABLET | Freq: Every day | ORAL | Status: DC

## 2018-04-10 MED ORDER — PREDNISONE 10 MG PO TABS: ORAL_TABLET | ORAL | Status: DC

## 2018-04-10 MED ORDER — PANTOPRAZOLE SODIUM 40 MG PO TBEC: 40.0000 mg | DELAYED_RELEASE_TABLET | Freq: Every day | ORAL | Status: DC

## 2018-04-10 MED ORDER — EZETIMIBE 10 MG PO TABS: 10.0000 mg | ORAL_TABLET | Freq: Every day | ORAL | Status: DC

## 2018-04-10 MED ORDER — ALPRAZOLAM 0.5 MG PO TABS: 0.5000 mg | ORAL_TABLET | Freq: Two times a day (BID) | ORAL | 0 refills | Status: DC

## 2018-04-10 MED ORDER — ROSUVASTATIN CALCIUM 40 MG PO TABS: 40.0000 mg | ORAL_TABLET | Freq: Every day | ORAL | Status: DC

## 2018-04-10 MED ORDER — IPRATROPIUM-ALBUTEROL 0.5-2.5 (3) MG/3ML IN SOLN: 3.0000 mL | Freq: Two times a day (BID) | RESPIRATORY_TRACT | Status: DC

## 2018-04-10 MED ORDER — LOSARTAN POTASSIUM 25 MG PO TABS
12.5000 mg | ORAL_TABLET | Freq: Every day | ORAL | Status: DC
  Administered 2018-04-10: 12.5 mg via ORAL
  Filled 2018-04-10: qty 1

## 2018-04-10 MED ORDER — LOSARTAN POTASSIUM 25 MG PO TABS: 12.5000 mg | ORAL_TABLET | Freq: Every day | ORAL | Status: DC

## 2018-04-10 MED ORDER — GABAPENTIN 300 MG PO CAPS: 600.0000 mg | ORAL_CAPSULE | Freq: Two times a day (BID) | ORAL | Status: DC

## 2018-04-10 NOTE — Clinical Social Work Note (Deleted)
Clinical Social Worker facilitated patient discharge including contacting patient family and facility to confirm patient discharge plans.  Clinical information faxed to facility and family agreeable with plan.  CSW arranged ambulance transport via [facility] to  [facility] (room 212).  RN to call [phone] for report prior to discharge.  Clinical Social Worker will sign off for now as social work intervention is no longer needed. Please consult [place] again if new need arises.  [place], [place]

## 2018-04-10 NOTE — Discharge Summary (Signed)
DISCHARGE SUMMARY  [name] [name]  MR#: [id number]  DOB:1945/09/15  Date of Admission: 03/25/2018 Date of Discharge: 04/10/2018  Attending Physician:Linnaea Ahn Hennie Duos, MD  Patient's PQZ:[id number], [name] [place], MD  Consults: CHF Team  Hematology PCCM  Disposition: D/C to SNF for rehab   Follow-up Appts:  Contact information for follow-up providers    Fenton Foy, NP Follow up on 04/28/2018.   Specialty:  Pulmonary Disease Why:  at 2pm  Contact information: [place] [address] [phone]        [facility]. Go on 04/17/2018.   Specialty:  Cardiology Why:  2:30 PM, Advanced [facility], parking code [employer] information: [address] [id number] [place] [phone]           Contact information for after-discharge care    Destination    [facility] .   Service:  Skilled Nursing Contact information: [address] [place] [address] [phone]                  Tests Needing Follow-up: -follow daily weights -follow renal function regularly - first eval suggested 04/13/18 -follow CBG - further titration of DM meds likely will be required   Discharge Diagnoses: Acute newly diagnosed systolic CHF Acute kidney failure on Chronic kidney disease stage III Acute hypoxic resp failure  Thrombocytopenia, suspect ITP Acute metabolic encephalopathy Community-acquired pneumonia Diabetes mellitus type 2; uncontrolled w/ Peripheral neuropathy  CAD Paroxysmal atrial fibrillation HTN Hypokalemia Obesity - Body mass index is 49.07 kg/m.  Initial presentation: 73yo w/ a hx of asthma, obstructive sleep apnea not on CPAP, chronic hypoxia on 2 L nasal cannula, CAD s/p CABG, diastolic CHF, DM2, PAD, and HTN who came to the hospital with cough with elevated blood pressure. She was found to be severely hypoxic,  requiring intubation initially. Hospital course complicated by severe thrombocytopenia concerning for ITP requiring IV steroids and IVIG. Eventually extubated 2/25. A TTE noted ejection fraction 25-30%  Hospital Course:  Acute newly diagnosed systolic CHF TTE noted EF 25-30% - TTE a year ago noted EF 55-60% - CHF Team directed care of this issue - Takotsubo v/s ischemic - will need eventual CAD eval - proving challenging to balance renal fxn w/ need for diuresis   Acute kidney failure on Chronic kidney disease stage III baseline creatinine 1.7 - crt climbed w/ attempts to aggressively diurese - renal US w/o evidence of a reversible issue, but doubt - renal function improving at time of d/c, w/ torsemide on hold - to resume torsemide 04/11/18  Acute hypoxic resp failure  Pulmonary edema + PNA + amiodarone toxicity - was intubated > extubated, and then required re-intubation on 2/29 - extubated again on 3/2 - amio stopped and steroid dosed for possible amio lung toxicity (to be tapered over a month) - has completed abx for PNA - able to be weaned to RA at rest prior to d/c to SNF   Thrombocytopenia, suspect ITP tx w/ 2 doses of IVIG + steroid burst - Hematology evaluated - resolved at time of d/c w/ plt count climbing consistently   Acute metabolic encephalopathy Delirium versus underlying infection versus steroid-induced - CT head unremarkable - likely multifactorial, w/ uremia contributing too - resolved at time of d/c w/ pt A&O x4  Community-acquired pneumonia completed course of empiric abx - appears stable from infectious standpoint   Diabetes mellitus type 2; uncontrolled w/  Peripheral neuropathy  CBG variable - follow as further titration of meds may be required as an outpt   CAD cath 9/19 with severe native CAD and LIMA patent, sequential SVG-PLOM/OM1 with 80% proximal stenosis, s/p DES, SVG to RCA chronically occluded - Cards followed - will need eventual outpt coronary  evaluation   Paroxysmal atrial fibrillation Off amiodarone due to fear for lung toxicity - sinus brady at time of d/c    HTN BP stable at time of d/c - will need more aggressive control if able once able to tolerate resumption of diuretic 3/10  Obesity - Body mass index is 49.07 kg/m.  Allergies as of 04/10/2018      Reactions   Amoxicillin Shortness Of Breath, Rash   Brilinta [ticagrelor] Shortness Of Breath   Erythromycin Shortness Of Breath, Other (See Comments), Hives   Trouble swallowing   Flagyl [metronidazole] Palpitations, Shortness Of Breath   Penicillins Hives, Shortness Of Breath, Rash, Other (See Comments)   Has patient had a PCN reaction causing immediate rash, facial/tongue/throat swelling, SOB or lightheadedness with hypotension: Yes Has patient had a PCN reaction causing severe rash involving mucus membranes or skin necrosis: No Has patient had a PCN reaction that required hospitalization: Yes Has patient had a PCN reaction occurring within the last 10 years: No If all of the above answers are "NO", then may proceed with Cephalosporin use.   Isosorbide Mononitrate [isosorbide Nitrate] Other (See Comments)   Joint aches, muscles hurt, difficult to walk   Jardiance [empagliflozin] Other (See Comments)   Nausea, joint aches, muscles aches   Metformin And Related Other (See Comments)   Stomach pain, cold sweats, joint pain, burred vision, dizziness   Tape Other (See Comments)   Skin pulls off with certain types Plastic tape causes skin to rip if left on for long periods of time   Erythromycin Base Rash      Medication List    STOP taking these medications   acetaminophen 500 MG tablet Commonly known as:  TYLENOL   Advair Diskus 250-50 MCG/DOSE Aepb Generic drug:  Fluticasone-Salmeterol Replaced by:  budesonide 0.25 MG/2ML nebulizer solution   albuterol (2.5 MG/3ML) 0.083% nebulizer solution Commonly known as:  PROVENTIL   albuterol 90 MCG/ACT  inhaler Commonly known as:  PROVENTIL,VENTOLIN   Alirocumab 150 MG/ML Sopn Commonly known as:  Praluent   amiodarone 100 MG tablet Commonly known as:  PACERONE   amLODipine 10 MG tablet Commonly known as:  NORVASC   b complex vitamins capsule   calcium carbonate 1500 (600 Ca) MG Tabs tablet Commonly known as:  OSCAL   cloNIDine 0.2 MG tablet Commonly known as:  Catapres   denosumab 60 MG/ML Soln injection Commonly known as:  PROLIA   gabapentin 600 MG tablet Commonly known as:  NEURONTIN Replaced by:  gabapentin 300 MG capsule   HumaLOG KwikPen 100 UNIT/ML KwikPen Generic drug:  insulin lispro   HYDROcodone-acetaminophen 5-325 MG tablet Commonly known as:  NORCO/VICODIN   Lantus SoloStar 100 UNIT/ML Solostar Pen Generic drug:  Insulin Glargine Replaced by:  insulin glargine 100 UNIT/ML injection   loperamide 2 MG capsule Commonly known as:  IMODIUM   magnesium oxide 400 MG tablet Commonly known as:  MAG-OX   methocarbamol 500 MG tablet Commonly known as:  ROBAXIN   metolazone 2.5 MG tablet Commonly known as:  ZAROXOLYN   multivitamin capsule   ondansetron 4 MG tablet Commonly known as:  ZOFRAN   potassium chloride SA 20 MEQ tablet Commonly  known as:  K-DUR,KLOR-CON   ranolazine 1000 MG SR tablet Commonly known as:  Ranexa   Refresh 1.4-0.6 % Soln Generic drug:  Polyvinyl Alcohol-Povidone PF   triamcinolone cream 0.1 % Commonly known as:  KENALOG   Vitamin D 50 MCG (2000 UT) tablet   zinc gluconate 50 MG tablet   zolpidem 10 MG tablet Commonly known as:  AMBIEN     TAKE these medications   ALPRAZolam 0.5 MG tablet Commonly known as:  XANAX Take 1 tablet (0.5 mg total) by mouth 2 (two) times daily. What changed:    how much to take  when to take this  additional instructions   apixaban 5 MG Tabs tablet Commonly known as:  ELIQUIS Take 1 tablet (5 mg total) by mouth 2 (two) times daily.   budesonide 0.25 MG/2ML nebulizer  solution Commonly known as:  PULMICORT Take 2 mLs (0.25 mg total) by nebulization 2 (two) times daily. Replaces:  Advair Diskus 250-50 MCG/DOSE Aepb   buPROPion 150 MG 24 hr tablet Commonly known as:  WELLBUTRIN XL Take 150 mg by mouth daily.   clopidogrel 75 MG tablet Commonly known as:  PLAVIX TAKE ONE (1) TABLET BY MOUTH EVERY DAY   DULoxetine 30 MG capsule Commonly known as:  CYMBALTA Take 1 capsule (30 mg total) by mouth daily. Start taking on:  April 11, 2018   ezetimibe 10 MG tablet Commonly known as:  ZETIA Take 1 tablet (10 mg total) by mouth at bedtime. What changed:  See the new instructions.   gabapentin 300 MG capsule Commonly known as:  NEURONTIN Take 2 capsules (600 mg total) by mouth 2 (two) times daily. Replaces:  gabapentin 600 MG tablet   insulin aspart 100 UNIT/ML injection Commonly known as:  novoLOG Inject 8 Units into the skin 3 (three) times daily with meals.   insulin glargine 100 UNIT/ML injection Commonly known as:  LANTUS Inject 0.35 mLs (35 Units total) into the skin at bedtime. Replaces:  Lantus SoloStar 100 UNIT/ML Solostar Pen   ipratropium-albuterol 0.5-2.5 (3) MG/3ML Soln Commonly known as:  DUONEB Take 3 mLs by nebulization 2 (two) times daily. What changed:    when to take this  reasons to take this   losartan 25 MG tablet Commonly known as:  COZAAR Take 0.5 tablets (12.5 mg total) by mouth daily. Start taking on:  April 11, 2018 What changed:    medication strength  how much to take   metoprolol succinate 50 MG 24 hr tablet Commonly known as:  TOPROL-XL Take 1 tablet (50 mg total) by mouth daily. Take with or immediately following a meal. What changed:    medication strength  how much to take  additional instructions   multivitamin-lutein Caps capsule Take 1 capsule by mouth daily.   nitroGLYCERIN 0.3 MG SL tablet Commonly known as:  NITROSTAT Place 1 tablet (0.3 mg total) under the tongue every 5 (five)  minutes x 3 doses as needed for chest pain.   OXYGEN Inhale 2 L into the lungs at bedtime.   pantoprazole 40 MG tablet Commonly known as:  PROTONIX Take 1 tablet (40 mg total) by mouth daily. Start taking on:  April 11, 2018 What changed:  when to take this   predniSONE 10 MG tablet Commonly known as:  DELTASONE 40mg  po QD x7 days, then 30mg  QD x7 days, then 20mg  QD x7 days, then 10mg  QD x7 days, then stop   rosuvastatin 40 MG tablet Commonly known as:  CRESTOR  Take 1 tablet (40 mg total) by mouth at bedtime. What changed:  See the new instructions.   spironolactone 25 MG tablet Commonly known as:  ALDACTONE Take 1 tablet (25 mg total) by mouth daily. Start taking on:  April 11, 2018   torsemide 20 MG tablet Commonly known as:  DEMADEX Take 1 tablet (20 mg total) by mouth daily. Start taking on:  April 11, 2018 What changed:    how much to take  when to take this   vitamin B-12 1000 MCG tablet Commonly known as:  CYANOCOBALAMIN Take 1,000 mcg by mouth daily.       Day of Discharge BP (!) 158/60   Pulse (!) 56   Temp 98.3 F (36.8 C) (Oral)   Resp 18   Ht 5\' 2"  (1.575 m)   Wt 121.7 kg   SpO2 94%   BMI 49.07 kg/m   Physical Exam: General: No acute respiratory distress Lungs: Clear to auscultation bilaterally - diminished breath sounds B bases  Cardiovascular: Regular rate and rhythm without M Abdomen: Nontender, obese, soft, bowel sounds positive, no rebound Extremities: No signif edema bilateral lower extremities  Basic Metabolic Panel: Recent Labs  Lab 04/04/18 0413 04/05/18 0321 04/06/18 0318 04/06/18 0319 04/07/18 0500 04/08/18 0435 04/09/18 0445 04/10/18 0512  NA 147* 143 137  --  135 139 140 140  K 3.2* 4.5 3.9  --  3.6 3.2* 3.3* 4.8  CL 104 104 96*  --  93* 97* 97* 102  CO2 33* 31 31  --  30 34* 34* 31  GLUCOSE 143* 286* 354*  --  242* 208* 105* 187*  BUN 31* 32* 40*  --  59* 69* 66* 48*  CREATININE 1.27* 1.36* 1.67*  --  2.06* 2.20*  1.72* 1.39*  CALCIUM 8.9 9.2 8.7*  --  8.7* 8.5* 8.8* 8.9  MG 2.1 2.0  --  1.7  --   --   --  2.1  PHOS 1.8* 2.4* 4.3  --   --   --   --  2.9    Liver Function Tests: Recent Labs  Lab 04/06/18 0318 04/10/18 0512  ALBUMIN 2.3* 2.6*   CBC: Recent Labs  Lab 04/04/18 0413 04/05/18 0321 04/06/18 0319 04/10/18 0512  WBC 13.8* 14.3* 15.5* 22.6*  NEUTROABS 10.5* 12.6* 14.1* 19.4*  HGB 11.0* 11.5* 11.4* 12.6  HCT 36.7 37.6 36.8 40.1  MCV 99.5 98.2 96.1 95.2  PLT 139* 178 188 226   BNP (last 3 results) Recent Labs    01/16/18 2150 03/25/18 1630  BNP 701.3* 1,480.1*    CBG: Recent Labs  Lab 04/09/18 0816 04/09/18 1158 04/09/18 1834 04/09/18 2103 04/10/18 0714  GLUCAP 93 126* 290* 324* 186*    Recent Results (from the past 240 hour(s))  Culture, blood (Routine X 2) w Reflex to ID Panel     Status: None   Collection Time: 04/01/18  4:30 PM  Result Value Ref Range Status   Specimen Description BLOOD SITE NOT SPECIFIED  Final   Special Requests   Final    BOTTLES DRAWN AEROBIC AND ANAEROBIC Blood Culture adequate volume   Culture   Final    NO GROWTH 5 DAYS Performed at [facility], [address], [place], [place] [address]    Report Status 04/06/2018 FINAL  Final  Culture, blood (Routine X 2) w Reflex to ID Panel     Status: None   Collection Time: 04/01/18  4:41 PM  Result Value  Ref Range Status   Specimen Description BLOOD LEFT WRIST  Final   Special Requests AEROBIC BOTTLE ONLY Blood Culture adequate volume  Final   Culture   Final    NO GROWTH 5 DAYS Performed at [facility], [address], [place], [place] [address]    Report Status 04/06/2018 FINAL  Final  Culture, Urine     Status: None   Collection Time: 04/01/18  5:05 PM  Result Value Ref Range Status   Specimen Description URINE, RANDOM  Final   Special Requests Immunocompromised  Final   Culture   Final    NO GROWTH Performed at [facility], [place] [address],  [place], [place] [address]    Report Status 04/02/2018 FINAL  Final  Culture, respiratory (non-expectorated)     Status: None   Collection Time: 04/01/18  5:05 PM  Result Value Ref Range Status   Specimen Description TRACHEAL ASPIRATE  Final   Special Requests Immunocompromised  Final   Gram Stain   Final    RARE WBC PRESENT, PREDOMINANTLY MONONUCLEAR NO ORGANISMS SEEN    Culture   Final    NO GROWTH 2 DAYS Performed at [facility], [place] [address] [place], [place] [address]    Report Status 04/03/2018 FINAL  Final     Time spent in discharge (includes decision making & examination of pt): 35 minutes  04/10/2018, 10:34 AM   Cherene Altes, MD [facility] Office  [phone]

## 2018-04-10 NOTE — Progress Notes (Signed)
VAST RN to pt's bedside to remove RA PICC for discharge. Educated patient about signs and symptoms of infection and when to call MD, keep dressing dry and intact for 24 hrs, no heavy lifting or pressure right arm X 24 hrs. Pt verbalized understanding. Line discontinued per policy without issues.

## 2018-04-10 NOTE — Clinical Social Work Note (Signed)
Pt transport put on hold. Facility is requesting a BNP and CBC. RN notified.   [place], [name] [name]

## 2018-04-10 NOTE — Clinical Social Work Placement (Signed)
   CLINICAL SOCIAL WORK PLACEMENT  NOTE  Date:  04/10/2018  Patient Details  Name: [name] [name] [name]: [id number] Date of Birth: 12-Sep-1945  Clinical Social Work is seeking post-discharge placement for this patient at the [place] level of care (*CSW will initial, date and re-position this form in  chart as items are completed):      Patient/family provided with [facility] Work Department's list of facilities offering this level of care within the geographic area requested by the patient (or if unable, by the patient's family).  Yes   Patient/family informed of their freedom to choose among providers that offer the needed level of care, that participate in Medicare, Medicaid or managed care program needed by the patient, have an available bed and are willing to accept the patient.      Patient/family informed of [facility]'s ownership interest in [facility] and [facility], as well as of the fact that they are under no obligation to receive care at these facilities.  PASRR submitted to EDS on       PASRR number received on 03/31/18     Existing PASRR number confirmed on       FL2 transmitted to all facilities in geographic area requested by pt/family on 03/31/18     FL2 transmitted to all facilities within larger geographic area on       Patient informed that his/her managed care company has contracts with or will negotiate with certain facilities, including the following:        Yes   Patient/family informed of bed offers received.  Patient chooses bed at [facility]     Physician recommends and patient chooses bed at      Patient to be transferred to [facility] on 04/10/18.  Patient to be transferred to facility by [facility]     Patient family notified on 04/10/18 of transfer.  Name of family member notified:  [name]     PHYSICIAN       Additional Comment:    _______________________________________________ Eileen Stanford, LCSW 04/10/2018, 1:20 PM

## 2018-04-10 NOTE — Progress Notes (Signed)
  [place] TEAM 1 - Stepdown/ICU TEAM  This patient has been cleared for D/C by her attending MD, as well as by the Cardiology/Advanced CHF Team. Despite this, her admission is being denied at her chosen SNF until a clinically irrelevant CBC w/ diff and BNP are obtained, and subsequently approved. In the interest of progressing the patient to her chosen rehab destination, I have ordered these tests. They are of no clinical benefit however, in that a bedside exam by 2 physicians as well as an NP has come to the unanimous decision that the patient is clinically stable for D/C.   Cherene Altes, MD [facility] Office  [phone]  04/10/2018, 2:08 PM

## 2018-04-10 NOTE — Progress Notes (Addendum)
Patient ID: [name] [name], female   DOB: [birth year], 73 y.o.   [name]: [id number]    Advanced Heart Failure Rounding Note   Subjective:    Yesterday losartan and torsemide held with elevated creatinine.   Denies SOB/orthopnea.    Objective:   Weight Range:  Vital Signs:   Temp:  [97.5 F (36.4 C)-98.3 F (36.8 C)] 98.3 F (36.8 C) (03/09 0700) Pulse Rate:  [45-56] 56 (03/09 0700) Resp:  [12-21] 18 (03/09 0700) BP: (113-158)/(53-86) 158/60 (03/09 0700) SpO2:  [91 %-98 %] 95 % (03/09 0700) Weight:  [121.7 kg] 121.7 kg (03/09 0442) Last BM Date: 04/08/18  Weight change: Filed Weights   04/08/18 0500 04/09/18 0500 04/10/18 0442  Weight: 123 kg 122.2 kg 121.7 kg    Intake/Output:   Intake/Output Summary (Last 24 hours) at 04/10/2018 0736 Last data filed at 04/10/2018 0540 Gross per 24 hour  Intake 490 ml  Output 1000 ml  Net -510 ml     Physical Exam: CVP 3-4  General:   No resp difficulty HEENT: normal Neck: supple. no JVD. Carotids 2+ bilat; no bruits. No lymphadenopathy or thryomegaly appreciated. Cor: PMI nondisplaced. Regular rate & rhythm. No rubs, gallops or murmurs. Lungs: clear Abdomen: obese, soft, nontender, nondistended. No hepatosplenomegaly. No bruits or masses. Good bowel sounds. Extremities: no cyanosis, clubbing, rash, edema Neuro: alert & orientedx3, cranial nerves grossly intact. moves all 4 extremities w/o difficulty. Affect pleasant   Telemetry: SB 50s  Labs: Basic Metabolic Panel: Recent Labs  Lab 04/04/18 0413 04/05/18 0321 04/06/18 1093 04/06/18 0319 04/07/18 0500 04/08/18 0435 04/09/18 0445 04/10/18 0512  NA 147* 143 137  --  135 139 140 140  K 3.2* 4.5 3.9  --  3.6 3.2* 3.3* 4.8  CL 104 104 96*  --  93* 97* 97* 102  CO2 33* 31 31  --  30 34* 34* 31  GLUCOSE 143* 286* 354*  --  242* 208* 105* 187*  BUN 31* 32* 40*  --  59* 69* 66* 48*  CREATININE 1.27* 1.36* 1.67*  --  2.06* 2.20* 1.72* 1.39*  CALCIUM 8.9 9.2 8.7*  --  8.7* 8.5*  8.8* 8.9  MG 2.1 2.0  --  1.7  --   --   --  2.1  PHOS 1.8* 2.4* 4.3  --   --   --   --  2.9    Liver Function Tests: Recent Labs  Lab 04/06/18 0318 04/10/18 0512  ALBUMIN 2.3* 2.6*   No results for input(s): LIPASE, AMYLASE in the last 168 hours. No results for input(s): AMMONIA in the last 168 hours.  CBC: Recent Labs  Lab 04/04/18 0413 04/05/18 0321 04/06/18 0319 04/10/18 0512  WBC 13.8* 14.3* 15.5* 22.6*  NEUTROABS 10.5* 12.6* 14.1* 19.4*  HGB 11.0* 11.5* 11.4* 12.6  HCT 36.7 37.6 36.8 40.1  MCV 99.5 98.2 96.1 95.2  PLT 139* 178 188 226    Cardiac Enzymes: No results for input(s): CKTOTAL, CKMB, CKMBINDEX, TROPONINI in the last 168 hours.  BNP: BNP (last 3 results) Recent Labs    01/16/18 2150 03/25/18 1630  BNP 701.3* 1,480.1*    ProBNP (last 3 results) No results for input(s): PROBNP in the last 8760 hours.    Other results:  Imaging: No results found.   Medications:     Scheduled Medications: . ALPRAZolam  0.5 mg Oral BID  . apixaban  5 mg Oral BID  . budesonide (PULMICORT) nebulizer solution  0.25 mg Nebulization BID  .  buPROPion  150 mg Oral Daily  . Chlorhexidine Gluconate Cloth  6 each Topical Daily  . clopidogrel  75 mg Oral Daily  . DULoxetine  30 mg Oral Daily  . ezetimibe  10 mg Oral QHS  . feeding supplement (GLUCERNA SHAKE)  237 mL Oral TID BM  . gabapentin  1,200 mg Oral QHS  . gabapentin  600 mg Oral BID  . insulin aspart  0-20 Units Subcutaneous TID WC  . insulin aspart  0-5 Units Subcutaneous QHS  . insulin aspart  8 Units Subcutaneous TID WC  . insulin glargine  35 Units Subcutaneous QHS  . ipratropium-albuterol  3 mL Nebulization BID  . metoprolol succinate  50 mg Oral Daily  . multivitamin  1 tablet Oral Daily  . pantoprazole  40 mg Oral Daily  . predniSONE  40 mg Oral Q breakfast  . rosuvastatin  40 mg Oral QHS  . sodium chloride flush  10-40 mL Intracatheter Q12H  . spironolactone  25 mg Oral Daily  . vitamin  B-12  1,000 mcg Oral Daily    Infusions: . sodium chloride Stopped (04/06/18 0200)    PRN Medications: sodium chloride, hydrALAZINE, hydrocortisone cream, nitroGLYCERIN, ondansetron (ZOFRAN) IV, sodium chloride flush   Assessment:   [name] [name] is a 73 y.o. female with multiple medical problems including a hx of CAD s/p CABG and multiple PCI, HFpEF s/p CardioMEMS, AF, cerebrovascular disease, CKD, DM2, PAD, admitted 2/22 with acute respiratory failure requiring mechanical intubation in the setting of URI with fever to 102 and cough.   Plan/Discussion:    1. Acute hypoxemic respiratory failure: Suspect combination of pulmonary edema and PNA.  Initially intubated, now extubated.  Reintubated on 2/29. CXR with diffuse bilateral airspace disease and PCT 3.08=>2.39 => 0.26=>0.13.  Extubated 3/2.  CT chest- mild patchy foci in both lungs. 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  She has now completed cefepime treatment for PNA.  - Prednisone taper over a month per CCM note.  2. Shock: Suspect primarily septic shock, now resolved and off pressors.    3. ID: Suspect PNA.  Initial fever to 102.6 with high PCT.  Now afebrile.  Cultures so far negative. She has completed course of cefepime.  4. Acute systolic CHF: Echo with EF 25-30%, severe hypokinesis-akinesis of the mid-apical LV myocardium. Prior echo with normal EF.  ?Takotsubo event with stress of acute illness.  However, she has known extensive coronary disease as well.   - With bradycardia, decreased Toprol XL to once daily.  Continue at lower dose.   - Continue spironolactone to 25 daily.  - Off losartan with creatinine rise. Restart 12.5 mg losartan today.  - Hold torsemide today. Restart 20 mg torsemide daily on 3/10. Suspect we will need to increase torsemide dose over the next week or so. 5. AKI on CKD: stage 3, baseline 1.4-1.8. Creatinine trending down to 2.2 => 1.72=>1.3 . - Restart  losartan 12.5 mg today.  - Restart torsemide tomorrow 20 mg daily on 3/10    6. CAD: Last cath 9/19 with severe native CAD and LIMA patent. Sequential SVG-PLOM/OM1 with 80% proximal stenosis, s/p DES. SVG to RCA chronically occluded. No CP.  Mild elevation in troponin with no trend, likely demand ischemia. Echo with newly low EF, may be Takotsubo.  - Continue statin.  - Plavix restarted 2/26 with plts rising.  - Would eventually like to do angiography with fall in EF but holding off  for now with AKI.  Probably will aim to do at some point as outpatient when more stable.   7. Atrial fibrillation: Paroxysmal.  Concern for amiodarone toxicity, off amiodarone.  - Maintaining NSR.  - Continue apixaban 5 mg bid.   8. Thrombocytopenia: ?ITP triggered by acute infection.  Doubt TTP, no schistocytes/evidence for hemolysis.  HIT negative.  Got 2 units plts 2/25.  Given a dose of Solumedrol and 2 doses IVIG. Platelet count has now recovered.  9. Delirium: Resolved.   10. Deconditioning: PT with recommendations for short term rehab. She wants to go to [facility], per [employer] team.   F/U in the HF clinic 3/16  HF meds for d/c  Losartan 12.5 mg daily Spironolactone 25 mg daily Metoprolol Succinate 50 mg daily Start torsemide 20 mg daily on 3/10  Apixaban 5 mg twice a day  Plavix 75 mg daily  Zetia 10 mg qhs Crestor 40 mg qhs    Amy Clegg NP-C  04/10/2018 7:36 AM  Patient seen with NP, agree with the above note.    She is stable today clinically.  Creatinine lower.  Volume status looks ok.   Would send to SNF on the above meds (restart torsemide 20 mg daily tomorrow) with close followup.   Loralie Champagne 04/10/2018 9:52 AM

## 2018-04-12 ENCOUNTER — Ambulatory Visit (HOSPITAL_COMMUNITY): Payer: Medicare Other

## 2018-04-14 ENCOUNTER — Ambulatory Visit (HOSPITAL_COMMUNITY): Payer: Medicare Other

## 2018-04-17 ENCOUNTER — Inpatient Hospital Stay (HOSPITAL_COMMUNITY): Payer: Medicare Other

## 2018-04-17 ENCOUNTER — Ambulatory Visit (HOSPITAL_COMMUNITY): Payer: Medicare Other

## 2018-04-19 ENCOUNTER — Ambulatory Visit (HOSPITAL_COMMUNITY): Payer: Medicare Other

## 2018-04-21 ENCOUNTER — Ambulatory Visit (HOSPITAL_COMMUNITY): Payer: Medicare Other

## 2018-04-24 ENCOUNTER — Ambulatory Visit (HOSPITAL_COMMUNITY): Payer: Medicare Other

## 2018-04-26 ENCOUNTER — Ambulatory Visit (HOSPITAL_COMMUNITY): Payer: Medicare Other

## 2018-04-28 ENCOUNTER — Inpatient Hospital Stay: Payer: Medicare Other | Admitting: Nurse Practitioner

## 2018-04-28 ENCOUNTER — Ambulatory Visit (HOSPITAL_COMMUNITY): Payer: Medicare Other

## 2018-05-01 ENCOUNTER — Ambulatory Visit (HOSPITAL_COMMUNITY): Payer: Medicare Other

## 2018-05-03 ENCOUNTER — Ambulatory Visit (HOSPITAL_COMMUNITY): Payer: Medicare Other

## 2018-05-04 ENCOUNTER — Emergency Department (HOSPITAL_BASED_OUTPATIENT_CLINIC_OR_DEPARTMENT_OTHER): Payer: Medicare Other

## 2018-05-04 ENCOUNTER — Other Ambulatory Visit: Payer: Self-pay

## 2018-05-04 ENCOUNTER — Emergency Department (HOSPITAL_BASED_OUTPATIENT_CLINIC_OR_DEPARTMENT_OTHER)
Admission: EM | Admit: 2018-05-04 | Discharge: 2018-05-04 | Disposition: A | Discharge status: Home / Self Care | Payer: Medicare Other | Attending: Emergency Medicine | Admitting: Emergency Medicine

## 2018-05-04 ENCOUNTER — Encounter: Payer: Self-pay | Admitting: Nurse Practitioner

## 2018-05-04 ENCOUNTER — Encounter (HOSPITAL_BASED_OUTPATIENT_CLINIC_OR_DEPARTMENT_OTHER): Payer: Self-pay | Admitting: Emergency Medicine

## 2018-05-04 DIAGNOSIS — Y92810 Car as the place of occurrence of the external cause: Secondary | ICD-10-CM | POA: Insufficient documentation

## 2018-05-04 DIAGNOSIS — I25110 Atherosclerotic heart disease of native coronary artery with unstable angina pectoris: Secondary | ICD-10-CM | POA: Diagnosis not present

## 2018-05-04 DIAGNOSIS — E1122 Type 2 diabetes mellitus with diabetic chronic kidney disease: Secondary | ICD-10-CM | POA: Insufficient documentation

## 2018-05-04 DIAGNOSIS — Y999 Unspecified external cause status: Secondary | ICD-10-CM | POA: Diagnosis not present

## 2018-05-04 DIAGNOSIS — Y9389 Activity, other specified: Secondary | ICD-10-CM | POA: Diagnosis not present

## 2018-05-04 DIAGNOSIS — Z951 Presence of aortocoronary bypass graft: Secondary | ICD-10-CM | POA: Diagnosis not present

## 2018-05-04 DIAGNOSIS — N189 Chronic kidney disease, unspecified: Secondary | ICD-10-CM | POA: Diagnosis not present

## 2018-05-04 DIAGNOSIS — Q7292 Unspecified reduction defect of left lower limb: Secondary | ICD-10-CM | POA: Diagnosis not present

## 2018-05-04 DIAGNOSIS — S99912A Unspecified injury of left ankle, initial encounter: Secondary | ICD-10-CM | POA: Diagnosis present

## 2018-05-04 DIAGNOSIS — I130 Hypertensive heart and chronic kidney disease with heart failure and stage 1 through stage 4 chronic kidney disease, or unspecified chronic kidney disease: Secondary | ICD-10-CM | POA: Diagnosis not present

## 2018-05-04 DIAGNOSIS — I5032 Chronic diastolic (congestive) heart failure: Secondary | ICD-10-CM | POA: Insufficient documentation

## 2018-05-04 DIAGNOSIS — S82892A Other fracture of left lower leg, initial encounter for closed fracture: Secondary | ICD-10-CM | POA: Diagnosis not present

## 2018-05-04 DIAGNOSIS — Z794 Long term (current) use of insulin: Secondary | ICD-10-CM | POA: Diagnosis not present

## 2018-05-04 DIAGNOSIS — W1789XA Other fall from one level to another, initial encounter: Secondary | ICD-10-CM | POA: Diagnosis not present

## 2018-05-04 MED ORDER — HYDROCODONE-ACETAMINOPHEN 5-325 MG PO TABS: 1.0000 | ORAL_TABLET | ORAL | 0 refills | Status: DC | PRN

## 2018-05-04 MED ORDER — OXYCODONE-ACETAMINOPHEN 5-325 MG PO TABS
2.0000 | ORAL_TABLET | Freq: Once | ORAL | Status: AC
  Administered 2018-05-04: 2 via ORAL
  Filled 2018-05-04: qty 2

## 2018-05-04 NOTE — ED Triage Notes (Signed)
Pt's leg gave out when trying to get out of the car today. Was discharged from rehab today for strengthening after Pneumonia and CHF. Twisted left ankle as she fell.

## 2018-05-04 NOTE — Discharge Instructions (Addendum)
You have been diagnosed today with fracture dislocation of the left ankle.  At this time there does not appear to be the presence of an emergent medical condition, however there is always the potential for conditions to change. Please read and follow the below instructions.  Please return to the Emergency Department immediately for any new or worsening symptoms.. Please be sure to follow up with your Primary Care Provider within one week regarding your visit today; please call their office to schedule an appointment even if you are feeling better for a follow-up visit. You may use the pain medication Norco as prescribed for severe pain.  Only take this as prescribed and avoid other sedating medications or alcohol when taking this medication.  Do not drive or operate machinery while taking Norco as will make you drowsy.  Please have your family keep an eye on you while taking this medication as it may make you drowsy. You must follow-up with the orthopedic specialist Dr. Stann Mainland regarding your ankle fracture.  This will need orthopedist evaluation for further management and treatment. Please use rest, ice and elevation to assist with your pain.  Get help right away if: Your skin or nails below your injury: Turn blue or gray. Feel cold. Become numb. Become less sensitive to touch. You develop new or severe pain in your leg or foot. You have tingling, burning, and tightness in your lower leg. You think that your cast is too tight. Get help right away if: Your pain is getting worse. The injured area tingles, becomes numb, or turns cold and blue. The part of your body above or below the cast is swollen and discolored. You cannot feel or move your fingers or toes. There is fluid leaking through the cast. You have severe pain or pressure under the cast. You have trouble breathing. You have shortness of breath. You have chest pain.  Please read the additional information packets attached to your  discharge summary.  Do not take your medicine if  develop an itchy rash, swelling in your mouth or lips, or difficulty breathing.

## 2018-05-04 NOTE — ED Notes (Signed)
Pt. Has positive pedal -pulse

## 2018-05-04 NOTE — ED Notes (Signed)
TIME OUT AT 2091 DOING REDUCTION WITH  EDP AND PAC KRISTIN PEGRAM EMT Silvester Reierson RN  LEFT ANKLE REDUCTION AT 0681   EDP ZAVITZ ATTEMPTING REDUCTION

## 2018-05-04 NOTE — ED Provider Notes (Signed)
[facility] EMERGENCY DEPARTMENT Provider Note   CSN: [id number] Arrival date & time: 05/04/18  1536    History   Chief Complaint Chief Complaint  Patient presents with  . Ankle Injury    HPI [name] [name] is a 73 y.o. female presented today for left ankle pain and swelling.  Patient was being assisted out of the car by her husband when she felt her left ankle "gave out".  She began to fall when her husband caught her underneath her arms.  Patient denies falling completely to the ground, head injury, loss of consciousness, neck/back, chest or abdominal pain.  Patient reports a moderate intensity throbbing pain to her left ankle constant worsened with movement and slightly improved with elevation and rest.  Patient denies any other injury or pain today.  Of note patient was returning home from rehab she recently had a 17-day admission to the hospital for respiratory failure.  Patient is on Eliquis and Plavix.     HPI  Past Medical History:  Diagnosis Date  . Abnormality of gait 05/14/2014  . Anemia    hx  . Anginal pain ([place])   . Anxiety   . Asthma   . Basal cell carcinoma 05/2014   "left shoulder"  . Bundle branch block, left    chronic/notes 07/18/2013  . CHF (congestive heart failure) ([place])   . Chronic bronchitis ([place])    "off and on all the time" (07/18/2013)  . Chronic insomnia 05/06/2015  . Chronic kidney disease    frequency, sees dr Jamal Maes every 4 to 6 months (01/16/2018)  . Chronic low back pain 08/24/2016  . Chronic lower back pain   . Claustrophobia   . Common migraine 05/14/2014  . Coronary artery disease   . Depression   . Dysrhythmia   . GERD (gastroesophageal reflux disease)   . H/O hiatal hernia   . Headache    "at least 2/month" (01/16/2018)  . Heart murmur   . Hyperlipidemia   . Hypertension   . Irregular heart beat   . Leg cramps    both legs at times  . Melanoma ([place]) 05/2014   "burned off BLE" (01/16/2018)  . Memory change  05/14/2014  . Migraine    "5-6/year"  (01/16/2018)  . Myocardial infarction ([place]) 04/1999, 02/2000, 01/2005; 2011; 2014  . Neuromuscular disorder ([place])    ?  . Obesity 01-2010  . Obstructive sleep apnea    "can't wear machine; I have claustrophobia" (01/16/2018)  . On home oxygen therapy    "2L at night and prn during daytime" (01/16/2018)  . Osteoarthritis    "knees and hands" (01/16/2018)  . Other and unspecified angina pectoris   . Peripheral vascular disease (HCC)    ? numbness, tingling arms and legs  . Pneumonia 2000's   "once"  . PONV (postoperative nausea and vomiting)   . Shortness of breath    with exertion  . Stroke ([place]) 03/22/12   right side brain; denies residual on 07/18/2013)  . Stroke [facility]) Oct. 2015   Affected pt.s balance  . Stroke [facility]) 10/2014   "affected my legs; fully recovered now"; still have sporatic memory issues from this one" (01/16/2018)  . Type II diabetes mellitus ([place])   . Ventral hernia    hx of  . Vomiting    persistent  . Vomiting blood     Patient Active Problem List   Diagnosis Date Noted  . Amiodarone pulmonary toxicity   . Physical  deconditioning   . 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 Acute on chronic congestive heart failure ([place])   . Acute on chronic combined systolic and diastolic CHF (congestive heart failure) ([place]) 01/16/2018  . Acute on chronic diastolic CHF (congestive heart failure) ([place]) 01/16/2018  . Acute on chronic diastolic (congestive) heart failure ([place]) 01/16/2018  . Atrial fibrillation ([place]) 10/21/2017  . CAD (coronary artery disease) of bypass graft 10/20/2017  . Coronary artery disease of bypass graft of native heart with stable angina pectoris ([place])   . Chronic low back pain 08/24/2016  . Pain of left thumb 08/05/2016  . Nocturnal hypoxia 06/02/2016  . Hoarseness 04/05/2016  . Laryngopharyngeal reflux (LPR) 04/05/2016  . Pharyngoesophageal dysphagia 04/05/2016  . Angina decubitus ([place]) 05/22/2015  . Chronic  insomnia 05/06/2015  . Common migraine 05/14/2014  . Abnormality of gait 05/14/2014  . Memory change 05/14/2014  . Aneurysm, cerebral, nonruptured 05/14/2014  . Cramp of limb-Left neck 05/10/2014  . Hematemesis with nausea   . Vomiting blood   . Dizziness and giddiness 09/21/2013  . Atypical chest pain 07/18/2013  . Unstable angina ([place]) 04/09/2013  . Carotid artery stenosis, symptomatic 03/20/2013  . Chronic diastolic CHF (congestive heart failure) ([place]) 09/23/2012  . Cerebrovascular disease 07/10/2012  . Cerebral artery occlusion with cerebral infarction ([place]) 07/10/2012  . Mitral regurgitation 04/15/2012  . TIA (transient ischemic attack) 04/15/2012  . Occlusion and stenosis of carotid artery without mention of cerebral infarction 08/18/2011  . Bariatric surgery status 06/17/2011  . Speech abnormality 03/22/2011  . Dyspnea 02/24/2011  . PAPILLARY MUSCLE DYSFUNCTION, NON-RHEUMATIC 10/09/2008  . UNSPECIFIED VITAMIN D DEFICIENCY 10/24/2007  . MYOCARDIAL INFARCTION, HX OF 10/24/2007  . PERSISTENT VOMITING 10/24/2007  . OSTEOARTHRITIS 10/24/2007  . MIGRAINES, HX OF 10/24/2007  . DM 01/16/2007  . Hyperlipemia 01/16/2007  . Obesity-post failed open gastroplasty 1984  01/16/2007  . OBSTRUCTIVE SLEEP APNEA 01/16/2007  . Essential hypertension 01/16/2007  . Coronary atherosclerosis 01/16/2007  . Asthma 01/16/2007  . GERD 01/16/2007  . VENTRAL HERNIA 01/16/2007    Past Surgical History:  Procedure Laterality Date  . ANKLE FRACTURE SURGERY Left 1970's  . APPENDECTOMY  1970's   w/hysterectomy  . BASAL CELL CARCINOMA EXCISION Left 05/2014   "shoulder" (01/16/2018)  . CARDIAC CATHETERIZATION  10/10/2012   Dr [name].  Marland Kitchen CARDIAC CATHETERIZATION N/A 05/29/2015   Procedure: Right/Left Heart Cath and Coronary/Graft Angiography;  Surgeon: Larey Dresser, MD;  Location: [facility] CV LAB;  Service: Cardiovascular;  Laterality: N/A;  . CAROTID ENDARTERECTOMY Left 03/2013  . CAROTID STENT  INSERTION Left 03/20/2013   Procedure: CAROTID STENT INSERTION;  Surgeon: [name], MD;  Location: [facility] CATH LAB;  Service: Cardiovascular;  Laterality: Left;  internal carotid  . CEREBRAL ANGIOGRAM N/A 04/05/2011   Procedure: CEREBRAL ANGIOGRAM;  Surgeon: Angelia Mould, MD;  Location: [facility] CATH LAB;  Service: Cardiovascular;  Laterality: N/A;  . CHOLECYSTECTOMY OPEN  2004  . CORONARY ANGIOPLASTY WITH STENT PLACEMENT  01,02,05,06,07,08,11; 04/24/2013   "I've probably got ~ 10 stents by now" (04/24/2013)  . CORONARY ANGIOPLASTY WITH STENT PLACEMENT  06/13/2013   "got 4 stents today" (06/13/2013)  . CORONARY ARTERY BYPASS GRAFT  1220/11   "CABG X5"  . CORONARY STENT INTERVENTION N/A 10/20/2017   Procedure: CORONARY STENT INTERVENTION;  Surgeon: Troy Sine, MD;  Location: [facility] CV LAB;  Service: Cardiovascular;  Laterality: N/A;  . ESOPHAGOGASTRODUODENOSCOPY  08/03/2011   Procedure: ESOPHAGOGASTRODUODENOSCOPY (EGD);  Surgeon: Shann Medal, MD;  Location: [facility] ENDOSCOPY;  Service: General;  Laterality: N/A;  . ESOPHAGOGASTRODUODENOSCOPY (EGD) WITH PROPOFOL N/A 03/11/2014   Procedure: ESOPHAGOGASTRODUODENOSCOPY (EGD) WITH PROPOFOL;  Surgeon: Lafayette Dragon, MD;  Location: [facility] ENDOSCOPY;  Service: Endoscopy;  Laterality: N/A;  . FRACTURE SURGERY    . gall stone removal  05/2003  . GASTRIC RESTRICTION SURGERY  1984   "stapeling"  . HERNIA REPAIR  2004   "in my stomach; had OR on it twice", wire mesh on 1 hernia  . LEFT HEART CATH AND CORS/GRAFTS ANGIOGRAPHY N/A 07/09/2016   Procedure: LEFT HEART CATH AND CORS/GRAFTS ANGIOGRAPHY;  Surgeon: Larey Dresser, MD;  Location: [facility] CV LAB;  Service: Cardiovascular;  Laterality: N/A;  . OVARY SURGERY  1970's   "tumor removed"  . PERCUTANEOUS CORONARY STENT INTERVENTION (PCI-S) N/A 06/13/2013   Procedure: PERCUTANEOUS CORONARY STENT INTERVENTION (PCI-S);  Surgeon: [name], MD;  Location: [facility] CATH LAB;  Service: Cardiovascular;   Laterality: N/A;  . PERCUTANEOUS STENT INTERVENTION N/A 04/24/2013   Procedure: PERCUTANEOUS STENT INTERVENTION;  Surgeon: [name], MD;  Location: [facility] CATH LAB;  Service: Cardiovascular;  Laterality: N/A;  . RIGHT/LEFT HEART CATH AND CORONARY/GRAFT ANGIOGRAPHY N/A 10/20/2017   Procedure: RIGHT/LEFT HEART CATH AND CORONARY/GRAFT ANGIOGRAPHY;  Surgeon: Larey Dresser, MD;  Location: [facility] CV LAB;  Service: Cardiovascular;  Laterality: N/A;  . ROOT CANAL  10/2000  . TIBIA FRACTURE SURGERY Right 1970's   rods and pins  . TOOTH EXTRACTION     "1 on the upper; wisdom tooth on the lower" (01/16/2018)  . TOTAL ABDOMINAL HYSTERECTOMY  1970's   w/ appendectomy     OB History   No obstetric history on file.      Home Medications    Prior to Admission medications   Medication Sig Start Date End Date Taking? Authorizing Provider  ALPRAZolam Duanne Moron) 0.5 MG tablet Take 1 tablet (0.5 mg total) by mouth 2 (two) times daily. 04/10/18  Yes Cherene Altes, MD  apixaban (ELIQUIS) 5 MG TABS tablet Take 1 tablet (5 mg total) by mouth 2 (two) times daily. 02/08/18  Yes Georgiana Shore, NP  budesonide (PULMICORT) 0.25 MG/2ML nebulizer solution Take 2 mLs (0.25 mg total) by nebulization 2 (two) times daily. 04/10/18  Yes Cherene Altes, MD  buPROPion (WELLBUTRIN XL) 150 MG 24 hr tablet Take 150 mg by mouth daily.   Yes [provider]  ipratropium-albuterol (DUONEB) 0.5-2.5 (3) MG/3ML SOLN Take 3 mLs by nebulization 2 (two) times daily. 04/10/18  Yes Cherene Altes, MD  losartan (COZAAR) 25 MG tablet Take 0.5 tablets (12.5 mg total) by mouth daily. 04/11/18  Yes Cherene Altes, MD  metoprolol succinate (TOPROL-XL) 50 MG 24 hr tablet Take 1 tablet (50 mg total) by mouth daily. Take with or immediately following a meal. 04/10/18  Yes Cherene Altes, MD  multivitamin-lutein [facility]) CAPS capsule Take 1 capsule by mouth daily.   Yes [provider]  OXYGEN Inhale  2 L into the lungs at bedtime.    Yes [provider]  pantoprazole (PROTONIX) 40 MG tablet Take 1 tablet (40 mg total) by mouth daily. 04/11/18  Yes Cherene Altes, MD  predniSONE (DELTASONE) 10 MG tablet 40mg  po QD x7 days, then 30mg  QD x7 days, then 20mg  QD x7 days, then 10mg  QD x7 days, then stop 04/10/18  Yes Cherene Altes, MD  rosuvastatin (CRESTOR) 40 MG tablet Take 1 tablet (40 mg total) by mouth at  bedtime. 04/10/18  Yes Cherene Altes, MD  spironolactone (ALDACTONE) 25 MG tablet Take 1 tablet (25 mg total) by mouth daily. 04/11/18  Yes Cherene Altes, MD  torsemide (DEMADEX) 20 MG tablet Take 1 tablet (20 mg total) by mouth daily. 04/11/18  Yes Cherene Altes, MD  vitamin B-12 (CYANOCOBALAMIN) 1000 MCG tablet Take 1,000 mcg by mouth daily.   Yes [provider]  clopidogrel (PLAVIX) 75 MG tablet TAKE ONE (1) TABLET BY MOUTH EVERY DAY 02/09/18   Bensimhon, Shaune Pascal, MD  DULoxetine (CYMBALTA) 30 MG capsule Take 1 capsule (30 mg total) by mouth daily. 04/11/18   Cherene Altes, MD  ezetimibe (ZETIA) 10 MG tablet Take 1 tablet (10 mg total) by mouth at bedtime. 04/10/18   Cherene Altes, MD  gabapentin (NEURONTIN) 300 MG capsule Take 2 capsules (600 mg total) by mouth 2 (two) times daily. 04/10/18   Cherene Altes, MD  HYDROcodone-acetaminophen (NORCO/VICODIN) 5-325 MG tablet Take 1-2 tablets by mouth every 4 (four) hours as needed for severe pain. 05/04/18   Nuala Alpha A, PA-C  insulin aspart (NOVOLOG) 100 UNIT/ML injection Inject 8 Units into the skin 3 (three) times daily with meals. 04/10/18   Cherene Altes, MD  insulin glargine (LANTUS) 100 UNIT/ML injection Inject 0.35 mLs (35 Units total) into the skin at bedtime. 04/10/18   Cherene Altes, MD  nitroGLYCERIN (NITROSTAT) 0.3 MG SL tablet Place 1 tablet (0.3 mg total) under the tongue every 5 (five) minutes x 3 doses as needed for chest pain. 09/15/17   Georgiana Shore, NP    Family History  Family History  Problem Relation Age of Onset  . Heart disease Father        Heart Disease before age 66  . Diabetes Father   . Hyperlipidemia Father   . Hypertension Father   . Heart attack Father   . Deep vein thrombosis Father   . AAA (abdominal aortic aneurysm) Father   . Hypertension Mother   . Deep vein thrombosis Mother   . AAA (abdominal aortic aneurysm) Mother   . Cancer Sister        Ovarian  . Hypertension Sister   . Diabetes Paternal Grandmother   . Heart disease Paternal Uncle   . Diabetes Paternal Uncle   . Heart attack Paternal Grandfather 42       died of MI at 61  . Diabetes Paternal Aunt   . Diabetes Paternal Uncle   . Colon cancer Neg Hx     Social History Social History   Tobacco Use  . Smoking status: Never Smoker  . Smokeless tobacco: Never Used  Substance Use Topics  . Alcohol use: Never    Frequency: Never  . Drug use: Never     Allergies   Amoxicillin; Brilinta [ticagrelor]; Erythromycin; Flagyl [metronidazole]; Penicillins; Isosorbide mononitrate [isosorbide nitrate]; Jardiance [empagliflozin]; Metformin and related; Tape; and Erythromycin base   Review of Systems Review of Systems  Constitutional: Negative.  Negative for chills and fever.  Eyes: Negative.  Negative for visual disturbance.  Respiratory: Negative.  Negative for cough and shortness of breath.   Cardiovascular: Negative.  Negative for chest pain.  Gastrointestinal: Negative.  Negative for abdominal pain, nausea and vomiting.  Musculoskeletal: Positive for arthralgias (Left ankle). Negative for back pain, neck pain and neck stiffness.  Skin: Negative.  Negative for wound.  Neurological: Negative.  Negative for dizziness, syncope, weakness, numbness and headaches.  All other systems reviewed  and are negative.  Physical Exam Updated Vital Signs BP (!) 147/87 (BP Location: Right Arm)   Pulse (!) 58   Temp 98 F (36.7 C) (Oral)   Resp 16   Ht 5' 2.5" (1.588 m)   Wt  113.4 kg   SpO2 98%   BMI 45.00 kg/m   Physical Exam Constitutional:      General: She is not in acute distress.    Appearance: Normal appearance. She is well-developed. She is not ill-appearing or diaphoretic.  HENT:     Head: Normocephalic and atraumatic. No raccoon eyes or Battle's sign.     Jaw: There is normal jaw occlusion. No trismus.     Right Ear: Tympanic membrane, ear canal and external ear normal. No hemotympanum.     Left Ear: Tympanic membrane, ear canal and external ear normal. No hemotympanum.     Nose: Nose normal.     Mouth/Throat:     Mouth: Mucous membranes are moist.     Pharynx: Oropharynx is clear. Uvula midline.  Eyes:     General: Vision grossly intact. Gaze aligned appropriately.     Extraocular Movements: Extraocular movements intact.     Conjunctiva/sclera: Conjunctivae normal.     Pupils: Pupils are equal, round, and reactive to light.     Comments: Visual fields grossly intact bilaterally  Neck:     Musculoskeletal: Full passive range of motion without pain, normal range of motion and neck supple.     Trachea: Trachea and phonation normal. No tracheal tenderness or tracheal deviation.  Cardiovascular:     Rate and Rhythm: Normal rate and regular rhythm.     Pulses:          Dorsalis pedis pulses are 2+ on the right side and 2+ on the left side.       Posterior tibial pulses are 2+ on the right side and 2+ on the left side.     Heart sounds: Normal heart sounds.  Pulmonary:     Effort: Pulmonary effort is normal. No accessory muscle usage or respiratory distress.     Breath sounds: Normal breath sounds and air entry.  Chest:     Chest wall: No deformity, tenderness or crepitus.  Abdominal:     General: There is no distension.     Palpations: Abdomen is soft.     Tenderness: There is no abdominal tenderness. There is no guarding or rebound.  Musculoskeletal:     Right ankle: Normal.     Left ankle: She exhibits decreased range of motion,  swelling and ecchymosis. Tenderness. Lateral malleolus and medial malleolus tenderness found. Achilles tendon normal.     Right lower leg: Normal.     Left lower leg: Normal.     Right foot: Normal.     Left foot: Normal.     Comments: No midline C/T/L spinal tenderness to palpation, no paraspinal muscle tenderness, no deformity, crepitus, or step-off noted. No sign of injury to the neck or back.  Hips stable to compression bilaterally without pain.  Patient able to bring bilateral knees to chest without difficulty.  Patient with swelling and pain around the left ankle.  Capillary refill intact to all toes.  Sensation intact to all toes.  Patient with moderate range of motion somewhat limited due to pain.  Compartments soft.  Full range of motion and strength at the knee and hips bilaterally.  Feet:     Right foot:     Protective Sensation: 5 sites  tested. 5 sites sensed.     Left foot:     Protective Sensation: 5 sites tested. 5 sites sensed.  Skin:    General: Skin is warm and dry.  Neurological:     Mental Status: She is alert.     GCS: GCS eye subscore is 4. GCS verbal subscore is 5. GCS motor subscore is 6.     Comments: Speech is clear and goal oriented, follows commands Major Cranial nerves without deficit, no facial droop Moves extremities without ataxia, coordination intact  Psychiatric:        Behavior: Behavior normal.    ED Treatments / Results  Labs (all labs ordered are listed, but only abnormal results are displayed) Labs Reviewed - No data to display  EKG None  Radiology Dg Ankle Complete Left  Result Date: 05/04/2018 CLINICAL DATA:  Pain. Twisting injury. EXAM: LEFT ANKLE COMPLETE - 3+ VIEW COMPARISON:  None. FINDINGS: There is a lateral malleolar fracture, with disruption of the ankle mortise. The tibia is displaced medially on the talus. There is marked soft tissue swelling. Vascular calcification is noted. Plantar and Achilles spurs are present. IMPRESSION:  Lateral malleolar fracture dislocation, with disruption of the ankle mortise. Electronically Signed   By: Staci Righter M.D.   On: 05/04/2018 17:10   Dg Ankle Left Port  Result Date: 05/04/2018 CLINICAL DATA:  Post reduction. EXAM: PORTABLE LEFT ANKLE - 1 VIEW COMPARISON:  Ankle series earlier today. FINDINGS: AP ankle film, one view only, demonstrates improved ankle mortise alignment. The tibia has been reduced to have a more anatomic alignment with the talus. Distal fibula and lateral malleolus are suboptimally positioned, either anterior or posterior to the talar articulation. IMPRESSION: Improved ankle mortise alignment. Electronically Signed   By: Staci Righter M.D.   On: 05/04/2018 19:00    Procedures Reduction of dislocation Date/Time: 05/04/2018 6:59 PM Performed by: Deliah Boston, PA-C Authorized by: Deliah Boston, PA-C  Consent: Verbal consent obtained. Risks and benefits: risks, benefits and alternatives were discussed Consent given by: patient Patient understanding: patient states understanding of the procedure being performed Patient consent: the patient's understanding of the procedure matches consent given Procedure consent: procedure consent matches procedure scheduled Relevant documents: relevant documents present and verified Test results: test results available and properly labeled Site marked: the operative site was marked Imaging studies: imaging studies available Required items: required blood products, implants, devices, and special equipment available Patient identity confirmed: verbally with patient and arm band Time out: Immediately prior to procedure a "time out" was called to verify the correct patient, procedure, equipment, support staff and site/side marked as required. Preparation: Patient was prepped and draped in the usual sterile fashion. Local anesthesia used: no  Anesthesia: Local anesthesia used: no  Sedation: Patient sedated: no  Patient  tolerance: Patient tolerated the procedure well with no immediate complications Comments: Reduction of lateral malleoli are fracture and mortise separation.  Procedure was supervised by Dr. Reather Converse.  Postreduction film was obtained showing improvement of dislocation and separation.  As patient with high risk it was decided to have procedure performed without sedation, she was given p.o. Percocet and ice was applied.  Patient understood procedure before proceeding and reasoning as to why sedation was not being used.  Additionally patient on Eliquis and Plavix so regional anesthesia was also not advisable. ========== After initial reduction x-ray was performed and a repeat reduction was necessary, 2 view x-ray was obtained and shows improvement of lateral dislocation.  Marland KitchenSplint Application  Date/Time: 05/04/2018 7:02 PM Performed by: Deliah Boston, PA-C Authorized by: Deliah Boston, PA-C   Consent:    Consent obtained:  Verbal   Consent given by:  Patient   Risks discussed:  Discoloration, numbness, pain and swelling Pre-procedure details:    Sensation:  Normal   Skin color:  Normal Procedure details:    Laterality:  Left   Location:  Ankle   Ankle:  L ankle   Cast type:  Short leg   Splint type:  Ankle stirrup and short leg   Supplies:  Ortho-Glass Post-procedure details:    Pain:  Improved   Sensation:  Normal   Skin color:  Normal   Patient tolerance of procedure:  Tolerated well, no immediate complications Comments:     Patient reports that her splint is comfortable.  Assisted by nurse tech Cyril Mourning. NVI post application.  Discussed with patient cast/splint care and return precautions including compartment syndrome.   (including critical care time)  Medications Ordered in ED Medications  oxyCODONE-acetaminophen (PERCOCET/ROXICET) 5-325 MG per tablet 2 tablet (2 tablets Oral Given 05/04/18 1654)     Initial Impression / Assessment and Plan / ED Course  I have reviewed  the triage vital signs and the nursing notes.  Pertinent labs & imaging results that were available during my care of the patient were reviewed by me and considered in my medical decision making (see chart for details).    73 year old female on Plavix and Eliquis was returning home after staying at rehab facility following extended hospital stay.  She is attempting to get out of her car with assistance from her husband when she rolled her ankle outwards.  Her husband was able to catch her before she fell completely to the ground.  She has no history of head injury, loss of consciousness, neck/back/hip pain, no chest or abdominal pain.  Patient reports her only injury is that of her left ankle and she has no other complaints.  She is neurovascularly intact to her left ankle upon arrival range of motion is somewhat limited secondary to pain.  Physical examination otherwise is reassuring with no sign of injury or back, no pain to the chest abdomen or pelvis.  All extremities with full range of motion and appropriate strength.  DG left ankle:    IMPRESSION:  Lateral malleolar fracture dislocation, with disruption of the ankle  mortise.   As patient is high risk for complications we did not pursue conscious sedation.  Additionally regional anesthesia of the ankle joint was not recommended as patient is on Plavix and Eliquis.  Shared decision-making was made with patient and we proceeded with p.o. Percocet and topical ice application prior to reduction of the ankle.  Procedure was performed as above with improvement of her pain.  Portable ankle x-ray performed with mprovement of the mortise.  Splint was applied.  NVI post reduction and splint application. - DG portable left ankle:  FINDINGS:  AP ankle film, one view only, demonstrates improved ankle mortise  alignment. The tibia has been reduced to have a more anatomic  alignment with the talus. Distal fibula and lateral malleolus are  suboptimally  positioned, either anterior or posterior to the talar  articulation.    IMPRESSION:  Improved ankle mortise alignment.   As portable imaging findings with suboptimal distal fibula positioning reduction was again reattempted.  Will perform 2 view x-ray for further evaluation.  Splint was reapplied.  Patient states that is comfortable.  NVI.  DG left  ankle:  IMPRESSION:  Significantly improved alignment of previously described lateral  talar dislocation. Stable distal left fibular fracture.  --------------------- Patient informed of imaging findings.  Reassessment of the foot and VI, she states that splint is comfortable.  Discussed precautions regarding splint care and appropriate follow-up.  Orthopedic referral given.  PMP has been reviewed and patient without any active narcotic prescription for the past 1 month.  Discussed precautions related to [place] patient states understanding.  Her husband is driving her home today.  Patient has family to assist her when she returns home however I have ordered for PT OT eval regarding this patient.  At this time there does not appear to be any evidence of an acute emergency medical condition and the patient appears stable for discharge with appropriate outpatient follow up. Diagnosis was discussed with patient who verbalizes understanding of care plan and is agreeable to discharge. I have discussed return precautions with patient who verbalizes understanding of return precautions. Patient encouraged to follow-up with their PCP and ortho. All questions answered.  Patient was seen and evaluated by Dr. Reather Converse during this visit. Patient's case rediscussed with Dr. Reather Converse who agrees with plan to discharge with follow-up.   Note: Portions of this report may have been transcribed using voice recognition software. Every effort was made to ensure accuracy; however, inadvertent computerized transcription errors may still be present.  Final Clinical Impressions(s) /  ED Diagnoses   Final diagnoses:  Reduction defect of lower extremity, left  Closed fracture of left ankle, initial encounter    ED Discharge Orders         [place]     05/04/18 [place]    Face-to-face encounter (required for Medicare/Medicaid patients)    Comments:  [place] certify that this patient is under my care and that I, or a nurse practitioner or physician's assistant working with me, had a face-to-face encounter that meets the physician face-to-face encounter requirements with this patient on 05/04/2018. The encounter with the patient was in whole, or in part for the following medical condition(s) which is the primary reason for home health care (List medical condition): Ankle fracture, multiple disabling condition for which patient will need assistance with ADLs.   05/04/18 1840    HYDROcodone-acetaminophen (NORCO/VICODIN) 5-325 MG tablet  Every 4 hours PRN     05/04/18 1842           Gari Crown 05/04/18 2031    Elnora Morrison, MD 05/08/18 [phone]

## 2018-05-05 ENCOUNTER — Ambulatory Visit (HOSPITAL_COMMUNITY): Payer: Medicare Other

## 2018-05-08 ENCOUNTER — Ambulatory Visit (HOSPITAL_COMMUNITY): Payer: Medicare Other

## 2018-05-10 ENCOUNTER — Ambulatory Visit (HOSPITAL_COMMUNITY): Payer: Medicare Other

## 2018-05-12 ENCOUNTER — Telehealth (HOSPITAL_COMMUNITY): Payer: Self-pay | Admitting: Cardiology

## 2018-05-12 ENCOUNTER — Other Ambulatory Visit: Payer: Self-pay

## 2018-05-12 ENCOUNTER — Ambulatory Visit (HOSPITAL_COMMUNITY): Payer: Medicare Other

## 2018-05-12 ENCOUNTER — Encounter (HOSPITAL_COMMUNITY): Payer: Self-pay | Admitting: *Deleted

## 2018-05-12 ENCOUNTER — Other Ambulatory Visit (HOSPITAL_COMMUNITY): Payer: Self-pay

## 2018-05-12 MED ORDER — APIXABAN 5 MG PO TABS: 5.0000 mg | ORAL_TABLET | Freq: Two times a day (BID) | ORAL | 6 refills | Status: DC

## 2018-05-12 MED ORDER — CLOPIDOGREL BISULFATE 75 MG PO TABS: 75.0000 mg | ORAL_TABLET | Freq: Every day | ORAL | 1 refills | Status: DC

## 2018-05-12 NOTE — Telephone Encounter (Signed)
Spoke to pt about recommendations and need for appointment. Scheduled appointment for 05/15/18 at 10:20am. Pt is agreeable to continue to hold her eliquis and plavix until speaking with Dr. [name] on Monday. Spoke to [employer] 4th hall RN. Per RN request sent note via fax to hold pt eliquis and plavix. Per RN [facility] agreed to help with telehealth visit. If RN unable to help with visit Monday pt cell phone number is [phone].

## 2018-05-12 NOTE — Pre-Procedure Instructions (Signed)
    [name] [name]  05/12/2018    [name] procedure is scheduled on Tuesday,  05/16/18 at 7:30 AM. .  Report to [facility] Entrance "A" Admitting Office at 5:30 AM.   Call this number if you have problems the morning of surgery: [phone]   Remember:  Patient is not to eat food after midnight Monday. 05/15/18.  You may drink clear liquids until 4:30 AM .  Clear liquids allowed are: Water, Gatorade, clear Juice (no pulp/citric), Clear tea, black coffee (no milk/cream), carbonated beverages                       Take these medicines the morning of surgery with A SIP OF WATER: Xanax, Wellbutrin, Cymbalta, Gabapentin, Metoprolol, Protonix, Hydrocodone - prn, Duo-neb - prn, Pulmicort nebulizer  Pt is to hold her Plavix and Eliquis as of today prior to surgery. We also ask that she holds her Multivitamin as of today prior to surgery.  Pt is to have 1/2 of her regular dose of Lantus Insulin Monday PM - she will take 14 units. Pt is not to have any Novolog insulin the morning of surgery. Please check patient's blood sugar Tuesday AM. If blood sugar is 70 or below, treat with 1/2 cup of clear juice (apple or cranberry) and recheck blood sugar 15 minutes after drinking juice.     Do not wear jewelry, make-up or nail polish.  Do not wear lotions, powders, or perfumes, or deodorant.  Do not shave 48 hours prior to surgery.  Men may shave face and neck.  Do not bring valuables to the hospital.  [facility] is not responsible for any belongings or valuables.  Contacts, dentures or bridgework may not be worn into surgery.  Leave your suitcase in the car.  After surgery it may be brought to your room.  For patients admitted to the hospital, discharge time will be determined by your treatment team.  Any questions on Monday, 05/15/18, please call me, Lilia Pro, RN at [phone]

## 2018-05-12 NOTE — Telephone Encounter (Signed)
Discussed with nurse, holding Plavix/Eliquis for now and will have telehealth appt Monday prior to surgery.

## 2018-05-12 NOTE — Progress Notes (Addendum)
Spoke with pt for pre-op call. Pt has extensive cardiac history with A-fib, CHF, CAD with CABG and a stent placed Sept. 2019. Pt is on Plavix and Eliquis. Dr. [name] is her cardiologist and he was contacted today, pt to hold both Plavix and Eliquis prior to surgery. Pt will have a virtual visit with Dr. [name] on Monday, 05/15/18. Pt states her last dose of both meds was yesterday, 05/11/18. Pt is a type 2 diabetic. Last A1C was 8.0 on 10/30/17. Pt states her fasting blood sugar is usually around 145. Pt is in a rehab facility. I spoke with Valerie Roys, LPN and then faxed pre-op instructions to her at [phone]. Sent chart to anesthesia for review.    Coronavirus Screening  Have you experienced the following symptoms:  Cough no Fever (>100.63F)  no Runny nose no Sore throat no Difficulty breathing/shortness of breath  no  Have you or a family member traveled in the last 14 days and where? no

## 2018-05-12 NOTE — Telephone Encounter (Signed)
Pt scheduled for surgery on Tuesday 05/16/18, would like recommendations sent regarding holding plavix    Open reduction internal fixation scheduled 4/14

## 2018-05-15 ENCOUNTER — Ambulatory Visit (HOSPITAL_BASED_OUTPATIENT_CLINIC_OR_DEPARTMENT_OTHER)
Admission: RE | Admit: 2018-05-15 | Discharge: 2018-05-15 | Disposition: A | Discharge status: Home / Self Care | Payer: Medicare Other | Source: Ambulatory Visit | Attending: Cardiology | Admitting: Cardiology

## 2018-05-15 ENCOUNTER — Telehealth (HOSPITAL_COMMUNITY): Payer: Self-pay

## 2018-05-15 ENCOUNTER — Ambulatory Visit (HOSPITAL_COMMUNITY): Payer: Medicare Other

## 2018-05-15 ENCOUNTER — Other Ambulatory Visit: Payer: Self-pay

## 2018-05-15 DIAGNOSIS — I480 Paroxysmal atrial fibrillation: Secondary | ICD-10-CM

## 2018-05-15 DIAGNOSIS — I5022 Chronic systolic (congestive) heart failure: Secondary | ICD-10-CM

## 2018-05-15 NOTE — Patient Instructions (Addendum)
1. Get BMET, CBC drawn at SNF and sent to [place]. Spoke with RN @ [facility] and she will fax results to office  2. Will have surgery tomorrow, will need telehealth followup in 2-3 weeks (forwarded to front desk Gina to schedule). Tell Linna Hoff he may need to see her post-op depending on how surgery goes. She will spend at least 1 night. Needs to start Plavix and Eliquis as soon a feasible after surgery.   3. If creatinine stable on BMET, will stop losartan and start Entresto.

## 2018-05-15 NOTE — Progress Notes (Signed)
Heart Failure TeleHealth Note  Due to national recommendations of social distancing due to [place] 19, Audio/video telehealth visit is felt to be most appropriate for this patient at this time.  See MyChart message from today for patient consent regarding telehealth for [name] [name].  Date:  05/15/2018   ID:  SHAKERRIA PARRAN, DOB 1945-09-10, [name] [id number]  Location: Home  Provider location: [facility] Type of Visit: Established patient   PCP:  Derinda Late, MD  Cardiologist:  Dr. [name]  Chief Complaint: Exertional shortness of breath   History of Present Illness: [name] [name] is a 73 y.o. female who presents via audio/video conferencing for a telehealth visit today.     she denies symptoms worrisome for COVID 19.   She has a history of CAD s/p CABG, diastolic CHF, and cerebrovascular disease with history of CVA presents for followup of CHF and diastolic CHF. She had CABG x 5 in 12/11.  Prior to the CABG she had multiple PCIs.  She had a CVA in 2/14 that presented as visual loss.  She had a left carotid stent in 2/15.   Left heart cath done in Sept. 2014. This showed patent SVG-D, LIMA-LAD, and sequential SVG-OM branches but SVG-RCA and the native RCA were both totally occluded. It was felt aht her increased symptoms coincided with occlusion of SVG-RCA.  She was started on Imdur to see if this would help with the chest pain and dyspnea.  However, she feels like Imdur causes leg cramps and does not think that she can take it.  So ranolazine 500 mg bid started and titrated this up to 1000 mg bid.  This helped some but not markedly.  Therefore, sent to  Dr. Irish Lack to address opening RCA CTO. He was able to do this in 5/15 with 4 overlapping Xience DES.  This led to resolution of exertional chest pain.    [name] [name] was started on Brilinta after PCI.  She became much more short of breath after starting on Brilinta. Per Dr Delano Metz stopped and replaced  with Plavix.  Dyspnea improved off Brilinta. Given increasing exertional dyspnea and chest pain,  RHC/LHC was done in 4/17.  This showed stable CAD with no interventional target.  Left and right heart filling pressures were not significantly increased.  Medical management.   She had an MRI/MRA in May 2018 that showed moderately severe stenosis of the supraclinoid segment of the right internal carotid artery, progressed since the 2016 MR angiogram and severe basilar artery stenosis.   She developed recurrent exertional chest pain and had LHC in 6/18, showing 4/5 grafts patent with patent native RCA (similar to prior cath, no changes).    Echo 1/19 with EF 55-60%, moderate diastolic dysfunction, PASP 50 mmHg, mildly dilated RV, mild AS, mild-moderate MR.   She reported increased dyspnea and chest pain and had RHC/LHC in 9/19.  She had DES to sequential SVG-OM1/PLOM.  While hospitalized, she was noted to have runs of atrial fibrillation.  She was very symptomatic with the atrial fibrillation.  Eliquis and amiodarone were started.   She was admitted 12/16-12/19/19 for cardiomems implant and RHC. She was diuresed with IV lasix and transitioned to torsemide 100 mg BID + metolazone once/week. DC weight: 295 lbs.  She had a prolonged hospitalization in 2/20 with acute respiratory failure and fever to 103.  She was thought to have PNA and treated with cefepime.  She was intubated.  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  Amiodarone was stopped and she was put on prednisone. Echo in 2/20 showed EF down to 25-30% with mid-apical LV severe hypokinesis. She had AKI. Possible ITP triggered by infection, HIT negative.  ITP was treated with Solumedrol. She was discharged to SNF.  She was sent home from SNF but fractured her ankle when she fell walking from car to house (mechanical).  She fractured her left ankle, this is scheduled for ORIF tomorrow at [name].   She seems to be  doing well in SNF with PT.  Breathing has been good, not short of breath walking around her room. Weight has trended down.  No chest pain.  No orthopnea/PND.  She does not have her Cardiomems pillow so not able to check.   Labs (6/14): K 3.8, creatinine 0.71 Labs (8/14): BNP 249, LDL 166 Labs (9/14): K 4.7, creatinine 0.7 Labs (9/14): K 4.6, creatinine 0.9, BNP 109 Labs (1/15): K 4.5, creatinine 0.73, LDL 212, HCT 43.4, TSH normal, BNP 138 Labs (6/15): K 4.7, creatinine 1.17, LDL 100, HDL 37, TGs 363, BNP 39 Labs (10/15): K 4.6, creatinine 1.2 Labs (1/16): LDL 157, HDL 43, K 5.2, creatinine 0.95 Labs (2/16): K 4.5, creatinine 1.08, BNP 113 Labs (4/16): LDL 50, HDL 56, TGs 179 Labs (9/16): K 5.3, creatinine 1.09 Labs (10/16): LDL 75, HDL 56 Labs (2/17): K 5.2, creatinine 1.07 Labs (4/17): K 5.1, creatinine 1.07 Labs (5/17): K 4.5, creatinine 1.01, LDL 96, HDL 56, BNP 70 Labs (03/2016) K 4.9, creatinine 1.07 Labs (7/18): K 4.9, creatinine 1.15 Labs (11/18): K 5.1, creatinine 1.11, LDL 36 Labs (1/19): K 4.9, creatinine 1.22 Labs (9/19): LDL 108 Labs (10/19): hgb 14.5, K 4.3, creatinine 1.03 Labs 12/06/2017: K 4.7 Creatinine 1.7 Labs (3/20): K 4.8, creatinine 1.39, hgb 12.4 plts 226  ECG (3/20, personally reviewed): NSR, LBBB   PMH: 1. Diabetic gastroparesis 2. Type II diabetes 3. HTN 4. Morbid obesity 5. CAD: s/p CABG in 12/11 after prior PCIs.  LIMA-LAD, SVG-D, seq SVG-OM1 and OM2, SVG-PDA. Adenosine Cardiolite (8/14) with EF 53% and a small reversible apical defect with a medium, partially reversible inferior defect.  LHC (9/14) with patent SVG-D, LIMA-LAD (50% distal LAD), and sequential SVG-OM branches; the SVG-RCA and the native RCA were occluded.  This was managed medically initially, but with ongoing exertional chest pain, it was decided to open CTO.  Patient had CTO opening with 4 overlapping Xience DES in the RCA in 5/15.   - LHC (4/17): SVG-D patent, LIMA-LAD patent, distal  LAD with several 40-50% stenoses, sequential SVG-OM1 and PLOM patent with 50% proximal stenosis (not flow limiting), SVG-RCA TO with patent RCA stents.  - LHC (6/18): 4/5 grafts patent (SVG-RCA TO, same as past), RCA stents patent => no change.  - LHC (9/19): Sequential SVG-PLOM/OM1 with 80% proximal stenosis, s/p DES.  6. Atypical migraines 7. OHS/OSA: Intolerant of CPAP. Uses oxygen with exertion and at night.  8. GERD with hiatal hernia 9. OA 10. Chronic systolic CHF: Echo (46/27) with EF 50-55%, grade II diastolic dysfunction, mild-moderate MR.  Echo (8/14) with EF 55-60%, grade II diastolic dysfunction, mildly increased aortic valve gradient (mean 12 mmHg) but valve opens well, mild MR and mild RV dilation.  Echo (9/15) with EF 60-65%, grade II diastolic dysfunction, mild aortic stenosis, mild mitral stenosis, mild to moderate mitral regurgitation, mildly dilated RV with normal systolic function, PA systolic pressure 46 mmHg.  - RHC (4/17): mean RA 9, PA  38/15 mean 27, mean PCWP 9, CI 2.5.  - Echo (5/17): EF 55-60%, mild LVH, mildly dilated RV with low normal systolic function, moderate TR, PASP 61 mmHg - Echo (1/19): EF 55-60%, moderate diastolic dysfunction, PASP 50 mmHg, mildly dilated RV, mild AS, mild-moderate MR. - RHC (9/19): mean RA 11, PA 34/12, mean PCWP 15, CI 2.85 - Echo (3/20): EF 25-30% with mid-apical severe hypokinesis.  11. CKD 12. Chronic LBBB 13. Anxiety 14. Carotid stenosis: Followed by [facility], >91% LICA stenosis 50/56.  She had left carotid stent in 2/15. Carotid dopplers 8/15 with no significant disease. Carotid dopplers (4/16): < 40% RICA stenosis.  15. Cerebrovascular disease: CVA 2/14 with right posterior cerebral artery territory ischemic infarction. Cerebral angiogram in 6/14 showed 70% right vertebral ostial stenosis, 97-94% LICA stenosis, > 80% proximal left posterior cerebral artery stenosis, posterior communicating artery aneurysm.  Patient has had episodes of  transient expressive aphasia.  Carotid dopplers (16/55) showed >37% LICA stenosis. She had a left carotid stent 2/15.  Possible CVA in 7/15. -  MRI/MRA in May 2018 that showed moderately severe stenosis of the supraclinoid segment of the right internal carotid artery, progressed since the 2016 MR angiogram and severe basilar artery stenosis.  16. Positional vertigo (suspected) 17. Palpitations: Holter (6/15) with rare PVCs/PACs.  - Event monitor (2/18): NSR, occasional PVCs 18. Dyspnea with Brilinta. 19. Melanoma: On face, s/p excision.   20. Aortic stenosis: Mild.  21. Holter (8/16) with no significant arrhythmias.  22. Lower extremity arterial dopplers (9/16) were normal.  23. Sleep study (4/18): No significant OSA.  24. Atrial fibrillation: Paroxysmal - Amiodarone stopped, ?lung toxicity.  25. H/o ITP in 3/20.    Current Outpatient Medications  Medication Sig Dispense Refill   ALPRAZolam (XANAX) 0.5 MG tablet Take 1 tablet (0.5 mg total) by mouth 2 (two) times daily. 60 tablet 0   apixaban (ELIQUIS) 5 MG TABS tablet Take 1 tablet (5 mg total) by mouth 2 (two) times daily. 60 tablet 6   budesonide (PULMICORT) 0.25 MG/2ML nebulizer solution Take 2 mLs (0.25 mg total) by nebulization 2 (two) times daily. 60 mL 12   buPROPion (WELLBUTRIN XL) 150 MG 24 hr tablet Take 150 mg by mouth daily.     clopidogrel (PLAVIX) 75 MG tablet Take 1 tablet (75 mg total) by mouth at bedtime. 90 tablet 1   DULoxetine (CYMBALTA) 30 MG capsule Take 1 capsule (30 mg total) by mouth daily.  3   ezetimibe (ZETIA) 10 MG tablet Take 1 tablet (10 mg total) by mouth at bedtime.     gabapentin (NEURONTIN) 300 MG capsule Take 2 capsules (600 mg total) by mouth 2 (two) times daily.     HYDROcodone-acetaminophen (NORCO/VICODIN) 5-325 MG tablet Take 1-2 tablets by mouth every 4 (four) hours as needed for severe pain. (Patient taking differently: Take 1 tablet by mouth 2 (two) times daily as needed for severe pain.  ) 10 tablet 0   insulin aspart (NOVOLOG) 100 UNIT/ML injection Inject 8 Units into the skin 3 (three) times daily with meals. 10 mL 11   insulin glargine (LANTUS) 100 UNIT/ML injection Inject 0.35 mLs (35 Units total) into the skin at bedtime. 10 mL 11   ipratropium-albuterol (DUONEB) 0.5-2.5 (3) MG/3ML SOLN Take 3 mLs by nebulization 2 (two) times daily. (Patient taking differently: Take 3 mLs by nebulization 2 (two) times daily as needed (shortness of breath). ) 360 mL    losartan (COZAAR) 25 MG tablet Take 0.5 tablets (12.5 mg  total) by mouth daily. (Patient taking differently: Take 12.5 mg by mouth at bedtime. )     metoprolol succinate (TOPROL-XL) 50 MG 24 hr tablet Take 1 tablet (50 mg total) by mouth daily. Take with or immediately following a meal.     Multiple Vitamins-Minerals (HEALTHY EYES/LUTEIN) TABS Take 1 tablet by mouth daily.     nitroGLYCERIN (NITROSTAT) 0.3 MG SL tablet Place 1 tablet (0.3 mg total) under the tongue every 5 (five) minutes x 3 doses as needed for chest pain. 9 tablet 1   OXYGEN Inhale 2 L into the lungs at bedtime.      pantoprazole (PROTONIX) 40 MG tablet Take 1 tablet (40 mg total) by mouth daily.     predniSONE (DELTASONE) 10 MG tablet 90m po QD x7 days, then 364mQD x7 days, then 2050mD x7 days, then 72m82m x7 days, then stop (Patient not taking: Reported on 05/12/2018)     rosuvastatin (CRESTOR) 40 MG tablet Take 1 tablet (40 mg total) by mouth at bedtime.     spironolactone (ALDACTONE) 25 MG tablet Take 1 tablet (25 mg total) by mouth daily.     torsemide (DEMADEX) 20 MG tablet Take 1 tablet (20 mg total) by mouth daily.     vitamin B-12 (CYANOCOBALAMIN) 1000 MCG tablet Take 1,000 mcg by mouth daily.     No current facility-administered medications for this encounter.     Allergies:   Amoxicillin; Brilinta [ticagrelor]; Erythromycin; Flagyl [metronidazole]; Penicillins; Isosorbide mononitrate [isosorbide nitrate]; Jardiance [empagliflozin];  Metformin and related; Tape; and Erythromycin base   Social History:  The patient  reports that she has never smoked. She has never used smokeless tobacco. She reports that she does not drink alcohol or use drugs.   Family History:  The patient's family history includes AAA (abdominal aortic aneurysm) in her father and mother; Cancer in her sister; Deep vein thrombosis in her father and mother; Diabetes in her father, paternal aunt, paternal grandmother, paternal uncle, and paternal uncle; Heart attack in her father; Heart attack (age of onset: 58) 56 her paternal grandfather; Heart disease in her father and paternal uncle; Hyperlipidemia in her father; Hypertension in her father, mother, and sister.   ROS:  Please see the history of present illness.   All other systems are personally reviewed and negative.   Exam:  (Video/Tele Health Call; Exam is subjective and or/visual.) BP 140/74, HR 70 (measured by nurse at SNF)[facility]  Speaks in full sentences. No resp difficulty. Lungs: Normal respiratory effort with conversation.  Abdomen: Non-distended per patient report Extremities: Pt denies edema. Neuro: Alert & oriented x 3.   Recent Labs: 01/16/2018: TSH 2.977 03/25/2018: ALT 22 04/10/2018: B Natriuretic Peptide 149.9; BUN 48; Creatinine, Ser 1.39; Hemoglobin 12.6; Magnesium 2.1; Platelets 226; Potassium 4.8; Sodium 140  Personally reviewed   Wt Readings from Last 3 Encounters:  05/04/18 113.4 kg (250 lb)  04/10/18 121.7 kg (268 lb 4.8 oz)  03/20/18 (!) 142.9 kg (315 lb)      ASSESSMENT AND PLAN:  1. CAD: Occluded native RCA and SVG-RCA. Status post opening of CTO RCA with 4 overlapping Xience DES in 5/15. DES to sequential SVG-OM1/PLOM in 9/19.  She has been on Plavix and Eliquis.  No chest pain, but her EF on echo done in 3/20 was down to 25-30%.  Not sure if this is due to CAD or is a stress (Takotsubo-type) cardiomyopathy in the setting of severe pulmonary disease with intubation.   No chest  pain.  - She has been on Plavix > 6 months, needs ankle surgery to allow her to walk.  Plan to hold Plavix prior to surgery (which is tomorrow) then restart as soon as possible afterwards.  Continue Plavix hopefully for a year (10/2018).  - She has not tolerated Imdur in the past. Continue Toprol XL.  She is off ranolazine but has not had any chest pain.  - No s/s ischemia - Starts cardiac rehab this month.  2. Chronic diastolic => systolic CHF: Echo in 8/58 with EF 55-60%, moderate diastolic dysfunction. Cardiomems placed 01/16/18.  Echo in 3/20 in setting of severe PNA with intubation showed EF 25-30%, mid-apical LV severe hypokinesis.  Possible stress (Takotsubo-type) cardiomyopathy related to severe medical illness versus progression of CAD.  She has baseline chronic LBBB which could play a role in cardiomyopathy as well (LBBB cardiomyopathy).  NYHA class II symptoms but not very active due to fractured ankle.  Weight has been stable in the 252-255 range.  She is on considerably less torsemide than in the past but weight not going up.  Unable to check Cardiomems (does not have pillow at SNF).  - Continue torsemide 20 mg daily.  I will arrange for BMET.  - Continue spironolactone 25 mg daily and Toprol XL 50 mg daily.  - Continue losartan 12.5 mg daily, would transition to [facility] if creatinine stable on BMET.  - Eventually would favor cardiac cath to assess for progressive CAD, but holding off elective studies currently due to [place] pandemic.  No chest pain. - She will need eventual repeat echo to see if EF remains low.  If no progression of CAD and EF remains low, she will be a CRT-D candidate with LBBB.  3. Hyperlipidemia: She remains on Zetia, Crestor and Praluent. Lipids mildly higher than goal in 9/19 with LDL 108, she is on maximal meds at this time. No change.    4. Hypertension: Stable. 5. Cerebrovascular disease: She has history of CVA and had left carotid stent in 2/15. Followed  by [facility].  Most recent MRA head showed severe stenosis of supraclinoid RICA and severe basilar artery stenosis. No change. 6. OHS/OSA: Cannot tolerate CPAP.  Uses oxygen with exertion and at night. No change. 7. Atrial fibrillation: Paroxysmal, no recent palpitations. 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   - She has completed course of prednisone for amiodarone lung toxicity.    - Apixaban on hold for ankle surgery, restart afterwards. 8. Obesity: She lost significant weight in the hospital.  9. Ankle fracture: Plan for ORIF tomorrow.  I think this is a necessary surgery to promote her mobility, but she is overall high risk for respiratory and cardiac complications given her multiple medical problems.  She has been > 6 months out from stent placement with no chest pain.  I think she can hold Plavix and apixaban temporarily, restart after surgery.   COVID screen The patient does not have any symptoms that suggest any further testing/ screening at this time.  Social distancing reinforced today.  Relevant cardiac medications were reviewed at length with the patient today.   The patient does not have concerns regarding their medications at this time.   Recommended follow-up:  Teleheath 2-3 weeks.   Today, I have spent 24 minutes with the patient with telehealth technology discussing the above issues .    Signed, Loralie Champagne, MD  05/15/2018 10:33 PM  [facility] [address]  [facility] [place] [id number] [phone] (office) [phone] (fax)

## 2018-05-15 NOTE — Progress Notes (Signed)
Spoke with Carmelina Peal, LPN at [facility] where patient resides. Verified patient does not have a cough, shob, recent contact with COVID-19, or fever (97.1 oral) while on phone with [facility], LPN.

## 2018-05-15 NOTE — Telephone Encounter (Signed)
Spoke with nurse at [facility] to obtain CBC/BMET will fax to office when resulted

## 2018-05-15 NOTE — Telephone Encounter (Signed)
Surgical clearance faxed  

## 2018-05-15 NOTE — Progress Notes (Signed)
Anesthesia Chart Review: SAME DAY WORKUP  Case:  [id number] Date/Time:  05/16/18 0715   Procedure:  OPEN REDUCTION INTERNAL FIXATION (ORIF) Left ankle with possible syndesmosis fixation (Left ) - 134min   Anesthesia type:  Choice   Pre-op diagnosis:  left ankle bimalleolar fracture   Location:  [place] OR ROOM 03 / [place] [facility]   Surgeon:  Nicholes Stairs, MD      DISCUSSION: 73 yo female never smoker. Pertinent hx includes sleep apnea not on CPAP, chronic hypoxia on 2 L nasal cannula, pAfib, CADs/pCABG 2011 and PCI 06/5972, diastolic CHF s/p Cardiomems 12/19,DM2,PAD, HTN, thrombocytopenia, CVA, carotid disease s/p L carotid stent 2015.  She was recently admitted to [facility] 2/22-04/10/2018. She presented with cough with elevated blood pressure. She was found to be severely hypoxic, requiring intubation initially, likely due to combination of pulmonary edema and PNA. Hospital course complicated by severe thrombocytopenia concerning for ITP requiring IV steroids and IVIG. Eventually extubated 2/25. A TTE noted ejection fraction 25-30%. Cardiology was consulted for acute newly diagnosed HFrEF. TTE a year ago noted EF 55-60%. Per cardiology consult note "Last cath 9/19 with severe native CAD and LIMA patent. Sequential SVG-PLOM/OM1 with 80% proximal stenosis, s/p DES. SVG to RCA chronically occluded. No CP. Mild elevation in troponin with no trend, likely demand ischemia. Echo with newly low EF, may be Takotsubo.Marland KitchenMarland KitchenWould eventually like to do angiography with fall in EF but holding off for now with AKI.  Probably will aim to do at some point as outpatient when more stable."  Pt was seen in followup by Dr. [name] via telehealth visit on 05/15/2018. I reached out to Dr. [name] directly via Epic message to make sure there were no acute concerns to discuss with anesthesia. He advised "She is overall high risk given her overall picture but stable. There is nothing further that can be done to lower the risk at this time. It  sounds like the surgery needs to be done so would proceed."  She will need DOS eval by assigned anesthesiologist.   VS: There were no vitals taken for this visit.  PROVIDERS: Derinda Late, MD is PCP  Loralie Champagne, MD is Cardiologist   LABS: Will need DOS labs  Labs Reviewed - No data to display   IMAGES: PORTABLE CHEST 1 VIEW 04/05/2018:  COMPARISON:  04/03/2018  FINDINGS: Cardiac shadow is mildly enlarged but stable. Cardio Mems device is again noted and stable. Endotracheal tube and nasogastric catheter have been removed in the interval. Right-sided PICC line is noted at the cavoatrial junction. Postsurgical changes are again seen and stable. Mild interstitial edema is seen without focal confluent infiltrate.  IMPRESSION: Mild interstitial edema without focal infiltrate.  PICC line in satisfactory position.  EKG: 04/08/2018: Sinus bradycardia. Rate 49. Left bundle branch block  CV: TTE 03/26/2018: IMPRESSIONS   1. The left ventricle has severely reduced systolic function, with an ejection fraction of 25-30%. The cavity size was mildly dilated. There is severe hypokinesis to akinesis of the mid to periapical left ventricular walls and septal dyssynergy. There  is moderately increased left ventricular wall thickness. Left ventricular diastolic Doppler parameters are consistent with pseudonormalization.  2. There is severe posterior mitral annular calcification present. There is moderate mitral regurgitation.  3. The aortic root is normal in size and structure.  4. The tricuspid valve is normal in structure. Tricuspid valve regurgitation is moderate.  5. The aortic valve is tricuspid. Mild calcification of the aortic valve.. Mild aortic annular calcification noted.  6. The pulmonic valve was grossly normal. Pulmonic valve regurgitation is mild to moderate is mild by color flow Doppler.  7. Left atrial size was severely dilated.  8. Right atrial size was  moderately dilated.  9. The right ventricle has moderately reduced systolic function. The cavity was normal. There is no increase in right ventricular wall thickness. The RA-RV peak gradient is 46 mmHg and suggestive of moderate pulmonary hypertension. CVP not estimated.  Cath/PCI 10/20/2017: Left Main  Mild disease.  Left Anterior Descending  Subtotal occlusion of the proximal LAD. Patent LIMA-LAD. There were serial 60% stenoses in the mid to distal LAD after LIMA touchdown. The SVG-D was patent.  Left Circumflex  50% proximal and mid LCx stenosis. OM1 with severe diffuse disease proximally. PLOM with diffuse disease. Sequential SVG-OM1 and PLOM was patent with a proximal 75% stenosis (likely also at the site of a venous valve).  Right Coronary Artery  Stented segment in the proximal to mid RCA is patent with 40% in-stent restenosis at the RCA ostium. 50% ostial PDA stenosis. The SVG-RCA is known to be occluded and was not injected.   Successful direct stenting of the proximal 70% smooth  eccentric saphenous vein graft stenosis sequentially supplying to distal circumflex marginal vessels with insertion of a 3.5 x 18 mm Xience Sierra DES stent postdilated to 3.8 mm with the stenosis being reduced to 0%.  There was brisk TIMI-3 flow and no evidence for dissection.  RECOMMENDATION: Medical therapy for the patient's concomitant CAD and chronic diastolic heart failure. Recommend uninterrupted dual antiplatelet therapy with Aspirin 81mg  daily and Clopidogrel 75mg  daily for a minimum of 12 months.  Carotid US 05/2017: Final Interpretation: Right Carotid: Velocities in the right ICA are consistent with a 1-39% stenosis.                Non-hemodynamically significant plaque <50% noted in the CCA. The                ECA appears >50% stenosed.  Left Carotid: Velocities in the left ICA are consistent with a 1-39% stenosis.               Non-hemodynamically significant plaque noted in the CCA. The  ECA               appears >50% stenosed.  Vertebrals:  Bilateral vertebral arteries demonstrate antegrade flow. Subclavians: Normal flow hemodynamics were seen in bilateral subclavian              arteries.   Past Medical History:  Diagnosis Date  . Abnormality of gait 05/14/2014  . Anemia    hx  . Anginal pain ([place])   . Anxiety   . Asthma   . Basal cell carcinoma 05/2014   "left shoulder"  . Bundle branch block, left    chronic/notes 07/18/2013  . CHF (congestive heart failure) ([place])   . Chronic bronchitis ([place])    "off and on all the time" (07/18/2013)  . Chronic insomnia 05/06/2015  . Chronic kidney disease    frequency, sees dr Jamal Maes every 4 to 6 months (01/16/2018)  . Chronic low back pain 08/24/2016  . Chronic lower back pain   . Claustrophobia   . Common migraine 05/14/2014  . Coronary artery disease   . Depression   . Dysrhythmia   . GERD (gastroesophageal reflux disease)   . H/O hiatal hernia   . Headache    "at least 2/month" (01/16/2018)  . Heart murmur   .  Hyperlipidemia   . Hypertension   . Irregular heart beat   . Leg cramps    both legs at times  . Melanoma ([place]) 05/2014   "burned off BLE" (01/16/2018)  . Memory change 05/14/2014  . Migraine    "5-6/year"  (01/16/2018)  . Myocardial infarction ([place]) 04/1999, 02/2000, 01/2005; 2011; 2014  . Neuromuscular disorder ([place])    ?  . Obesity 01-2010  . Obstructive sleep apnea    "can't wear machine; I have claustrophobia" (01/16/2018), states she had a 2nd sleep study and does not have sleep apnea, her O2 decreases and now is on 2 L of O2 at night.  . On home oxygen therapy    "2L at night and prn during daytime" (01/16/2018)  . Osteoarthritis    "knees and hands" (01/16/2018)  . Other and unspecified angina pectoris   . Peripheral vascular disease ([facility])    ? numbness, tingling arms and legs  . Pneumonia 2000's   "once"  . PONV (postoperative nausea and vomiting)   . Shortness of breath    with  exertion  . Stroke ([place]) 03/22/12   right side brain; denies residual on 07/18/2013)  . Stroke [facility]) Oct. 2015   Affected pt.s balance  . Stroke [facility]) 10/2014   "affected my legs; fully recovered now"; still have sporatic memory issues from this one" (01/16/2018)  . Type II diabetes mellitus ([place])   . Ventral hernia    hx of  . Vomiting    persistent  . Vomiting blood     Past Surgical History:  Procedure Laterality Date  . ANKLE FRACTURE SURGERY Left 1970's  . APPENDECTOMY  1970's   w/hysterectomy  . BASAL CELL CARCINOMA EXCISION Left 05/2014   "shoulder" (01/16/2018)  . CARDIAC CATHETERIZATION  10/10/2012   Dr [name].  Marland Kitchen CARDIAC CATHETERIZATION N/A 05/29/2015   Procedure: Right/Left Heart Cath and Coronary/Graft Angiography;  Surgeon: Larey Dresser, MD;  Location: [place] CV LAB;  Service: Cardiovascular;  Laterality: N/A;  . CAROTID ENDARTERECTOMY Left 03/2013  . CAROTID STENT INSERTION Left 03/20/2013   Procedure: CAROTID STENT INSERTION;  Surgeon: [name], MD;  Location: [facility] CATH LAB;  Service: Cardiovascular;  Laterality: Left;  internal carotid  . CEREBRAL ANGIOGRAM N/A 04/05/2011   Procedure: CEREBRAL ANGIOGRAM;  Surgeon: Angelia Mould, MD;  Location: [facility] CATH LAB;  Service: Cardiovascular;  Laterality: N/A;  . CHOLECYSTECTOMY OPEN  2004  . CORONARY ANGIOPLASTY WITH STENT PLACEMENT  01,02,05,06,07,08,11; 04/24/2013   "I've probably got ~ 10 stents by now" (04/24/2013)  . CORONARY ANGIOPLASTY WITH STENT PLACEMENT  06/13/2013   "got 4 stents today" (06/13/2013)  . CORONARY ARTERY BYPASS GRAFT  1220/11   "CABG X5"  . CORONARY STENT INTERVENTION N/A 10/20/2017   Procedure: CORONARY STENT INTERVENTION;  Surgeon: Troy Sine, MD;  Location: [place] CV LAB;  Service: Cardiovascular;  Laterality: N/A;  . ESOPHAGOGASTRODUODENOSCOPY  08/03/2011   Procedure: ESOPHAGOGASTRODUODENOSCOPY (EGD);  Surgeon: Shann Medal, MD;  Location: [facility];  Service: General;   Laterality: N/A;  . ESOPHAGOGASTRODUODENOSCOPY (EGD) WITH PROPOFOL N/A 03/11/2014   Procedure: ESOPHAGOGASTRODUODENOSCOPY (EGD) WITH PROPOFOL;  Surgeon: Lafayette Dragon, MD;  Location: [facility] ENDOSCOPY;  Service: Endoscopy;  Laterality: N/A;  . FRACTURE SURGERY    . gall stone removal  05/2003  . GASTRIC RESTRICTION SURGERY  1984   "stapeling"  . HERNIA REPAIR  2004   "in my stomach; had OR on it twice", wire mesh on 1 hernia  .  LEFT HEART CATH AND CORS/GRAFTS ANGIOGRAPHY N/A 07/09/2016   Procedure: LEFT HEART CATH AND CORS/GRAFTS ANGIOGRAPHY;  Surgeon: Larey Dresser, MD;  Location: [place] CV LAB;  Service: Cardiovascular;  Laterality: N/A;  . OVARY SURGERY  1970's   "tumor removed"  . PERCUTANEOUS CORONARY STENT INTERVENTION (PCI-S) N/A 06/13/2013   Procedure: PERCUTANEOUS CORONARY STENT INTERVENTION (PCI-S);  Surgeon: [name], MD;  Location: [facility] CATH LAB;  Service: Cardiovascular;  Laterality: N/A;  . PERCUTANEOUS STENT INTERVENTION N/A 04/24/2013   Procedure: PERCUTANEOUS STENT INTERVENTION;  Surgeon: [name], MD;  Location: [facility] CATH LAB;  Service: Cardiovascular;  Laterality: N/A;  . RIGHT/LEFT HEART CATH AND CORONARY/GRAFT ANGIOGRAPHY N/A 10/20/2017   Procedure: RIGHT/LEFT HEART CATH AND CORONARY/GRAFT ANGIOGRAPHY;  Surgeon: Larey Dresser, MD;  Location: [place] CV LAB;  Service: Cardiovascular;  Laterality: N/A;  . ROOT CANAL  10/2000  . TIBIA FRACTURE SURGERY Right 1970's   rods and pins  . TOOTH EXTRACTION     "1 on the upper; wisdom tooth on the lower" (01/16/2018)  . TOTAL ABDOMINAL HYSTERECTOMY  1970's   w/ appendectomy    MEDICATIONS: No current facility-administered medications for this encounter.    Marland Kitchen ALPRAZolam (XANAX) 0.5 MG tablet  . budesonide (PULMICORT) 0.25 MG/2ML nebulizer solution  . buPROPion (WELLBUTRIN XL) 150 MG 24 hr tablet  . DULoxetine (CYMBALTA) 30 MG capsule  . ezetimibe (ZETIA) 10 MG tablet  . gabapentin (NEURONTIN) 300 MG  capsule  . HYDROcodone-acetaminophen (NORCO/VICODIN) 5-325 MG tablet  . insulin aspart (NOVOLOG) 100 UNIT/ML injection  . insulin glargine (LANTUS) 100 UNIT/ML injection  . ipratropium-albuterol (DUONEB) 0.5-2.5 (3) MG/3ML SOLN  . losartan (COZAAR) 25 MG tablet  . metoprolol succinate (TOPROL-XL) 50 MG 24 hr tablet  . Multiple Vitamins-Minerals (HEALTHY EYES/LUTEIN) TABS  . nitroGLYCERIN (NITROSTAT) 0.3 MG SL tablet  . pantoprazole (PROTONIX) 40 MG tablet  . rosuvastatin (CRESTOR) 40 MG tablet  . spironolactone (ALDACTONE) 25 MG tablet  . torsemide (DEMADEX) 20 MG tablet  . vitamin B-12 (CYANOCOBALAMIN) 1000 MCG tablet  . apixaban (ELIQUIS) 5 MG TABS tablet  . clopidogrel (PLAVIX) 75 MG tablet  . OXYGEN  . predniSONE (DELTASONE) 10 MG tablet     Wynonia Musty [facility] Short Stay Center/Anesthesiology Phone [phone] 05/15/2018 2:04 PM

## 2018-05-16 ENCOUNTER — Inpatient Hospital Stay (HOSPITAL_COMMUNITY)
Admission: RE | Admit: 2018-05-16 | Discharge: 2018-05-19 | DRG: 493 | Disposition: A | Discharge status: Skilled Nursing Facility | Payer: Medicare Other | Source: Skilled Nursing Facility | Attending: Orthopedic Surgery | Admitting: Orthopedic Surgery

## 2018-05-16 ENCOUNTER — Encounter (HOSPITAL_COMMUNITY)
Admission: RE | Disposition: A | Discharge status: Skilled Nursing Facility | Payer: Self-pay | Source: Skilled Nursing Facility | Attending: Orthopedic Surgery

## 2018-05-16 ENCOUNTER — Other Ambulatory Visit: Payer: Self-pay

## 2018-05-16 ENCOUNTER — Inpatient Hospital Stay (HOSPITAL_COMMUNITY): Payer: Medicare Other | Admitting: Physician Assistant

## 2018-05-16 ENCOUNTER — Encounter (HOSPITAL_COMMUNITY): Payer: Self-pay | Admitting: *Deleted

## 2018-05-16 ENCOUNTER — Inpatient Hospital Stay (HOSPITAL_COMMUNITY): Payer: Medicare Other

## 2018-05-16 DIAGNOSIS — I2510 Atherosclerotic heart disease of native coronary artery without angina pectoris: Secondary | ICD-10-CM | POA: Diagnosis present

## 2018-05-16 DIAGNOSIS — Z6841 Body Mass Index (BMI) 40.0 and over, adult: Secondary | ICD-10-CM | POA: Diagnosis not present

## 2018-05-16 DIAGNOSIS — N179 Acute kidney failure, unspecified: Secondary | ICD-10-CM | POA: Diagnosis present

## 2018-05-16 DIAGNOSIS — Z90710 Acquired absence of both cervix and uterus: Secondary | ICD-10-CM | POA: Diagnosis not present

## 2018-05-16 DIAGNOSIS — I5042 Chronic combined systolic (congestive) and diastolic (congestive) heart failure: Secondary | ICD-10-CM | POA: Diagnosis present

## 2018-05-16 DIAGNOSIS — Z833 Family history of diabetes mellitus: Secondary | ICD-10-CM

## 2018-05-16 DIAGNOSIS — E785 Hyperlipidemia, unspecified: Secondary | ICD-10-CM | POA: Diagnosis present

## 2018-05-16 DIAGNOSIS — Z85820 Personal history of malignant melanoma of skin: Secondary | ICD-10-CM

## 2018-05-16 DIAGNOSIS — D62 Acute posthemorrhagic anemia: Secondary | ICD-10-CM | POA: Diagnosis not present

## 2018-05-16 DIAGNOSIS — Z794 Long term (current) use of insulin: Secondary | ICD-10-CM | POA: Diagnosis not present

## 2018-05-16 DIAGNOSIS — S82892A Other fracture of left lower leg, initial encounter for closed fracture: Secondary | ICD-10-CM | POA: Diagnosis present

## 2018-05-16 DIAGNOSIS — R531 Weakness: Secondary | ICD-10-CM | POA: Diagnosis not present

## 2018-05-16 DIAGNOSIS — K3184 Gastroparesis: Secondary | ICD-10-CM | POA: Diagnosis present

## 2018-05-16 DIAGNOSIS — S82842A Displaced bimalleolar fracture of left lower leg, initial encounter for closed fracture: Principal | ICD-10-CM | POA: Diagnosis present

## 2018-05-16 DIAGNOSIS — E1143 Type 2 diabetes mellitus with diabetic autonomic (poly)neuropathy: Secondary | ICD-10-CM | POA: Diagnosis present

## 2018-05-16 DIAGNOSIS — I10 Essential (primary) hypertension: Secondary | ICD-10-CM | POA: Diagnosis not present

## 2018-05-16 DIAGNOSIS — Z955 Presence of coronary angioplasty implant and graft: Secondary | ICD-10-CM

## 2018-05-16 DIAGNOSIS — Z951 Presence of aortocoronary bypass graft: Secondary | ICD-10-CM

## 2018-05-16 DIAGNOSIS — E1122 Type 2 diabetes mellitus with diabetic chronic kidney disease: Secondary | ICD-10-CM | POA: Diagnosis present

## 2018-05-16 DIAGNOSIS — Z7901 Long term (current) use of anticoagulants: Secondary | ICD-10-CM

## 2018-05-16 DIAGNOSIS — N183 Chronic kidney disease, stage 3 (moderate): Secondary | ICD-10-CM | POA: Diagnosis present

## 2018-05-16 DIAGNOSIS — I252 Old myocardial infarction: Secondary | ICD-10-CM | POA: Diagnosis not present

## 2018-05-16 DIAGNOSIS — Z7902 Long term (current) use of antithrombotics/antiplatelets: Secondary | ICD-10-CM

## 2018-05-16 DIAGNOSIS — I130 Hypertensive heart and chronic kidney disease with heart failure and stage 1 through stage 4 chronic kidney disease, or unspecified chronic kidney disease: Secondary | ICD-10-CM | POA: Diagnosis present

## 2018-05-16 DIAGNOSIS — Z8673 Personal history of transient ischemic attack (TIA), and cerebral infarction without residual deficits: Secondary | ICD-10-CM | POA: Diagnosis not present

## 2018-05-16 DIAGNOSIS — E1151 Type 2 diabetes mellitus with diabetic peripheral angiopathy without gangrene: Secondary | ICD-10-CM | POA: Diagnosis present

## 2018-05-16 DIAGNOSIS — S93432A Sprain of tibiofibular ligament of left ankle, initial encounter: Secondary | ICD-10-CM | POA: Diagnosis present

## 2018-05-16 DIAGNOSIS — E1142 Type 2 diabetes mellitus with diabetic polyneuropathy: Secondary | ICD-10-CM | POA: Diagnosis present

## 2018-05-16 DIAGNOSIS — E662 Morbid (severe) obesity with alveolar hypoventilation: Secondary | ICD-10-CM | POA: Diagnosis present

## 2018-05-16 DIAGNOSIS — Z7951 Long term (current) use of inhaled steroids: Secondary | ICD-10-CM

## 2018-05-16 DIAGNOSIS — F40240 Claustrophobia: Secondary | ICD-10-CM | POA: Diagnosis present

## 2018-05-16 DIAGNOSIS — Z9049 Acquired absence of other specified parts of digestive tract: Secondary | ICD-10-CM | POA: Diagnosis not present

## 2018-05-16 DIAGNOSIS — K219 Gastro-esophageal reflux disease without esophagitis: Secondary | ICD-10-CM | POA: Diagnosis present

## 2018-05-16 DIAGNOSIS — Z419 Encounter for procedure for purposes other than remedying health state, unspecified: Secondary | ICD-10-CM

## 2018-05-16 DIAGNOSIS — I480 Paroxysmal atrial fibrillation: Secondary | ICD-10-CM | POA: Diagnosis present

## 2018-05-16 DIAGNOSIS — G8929 Other chronic pain: Secondary | ICD-10-CM | POA: Diagnosis present

## 2018-05-16 DIAGNOSIS — Z79899 Other long term (current) drug therapy: Secondary | ICD-10-CM

## 2018-05-16 DIAGNOSIS — W1830XA Fall on same level, unspecified, initial encounter: Secondary | ICD-10-CM | POA: Diagnosis present

## 2018-05-16 DIAGNOSIS — Z9981 Dependence on supplemental oxygen: Secondary | ICD-10-CM | POA: Diagnosis not present

## 2018-05-16 DIAGNOSIS — I5022 Chronic systolic (congestive) heart failure: Secondary | ICD-10-CM | POA: Diagnosis not present

## 2018-05-16 DIAGNOSIS — S82892D Other fracture of left lower leg, subsequent encounter for closed fracture with routine healing: Secondary | ICD-10-CM | POA: Diagnosis not present

## 2018-05-16 DIAGNOSIS — F329 Major depressive disorder, single episode, unspecified: Secondary | ICD-10-CM | POA: Diagnosis present

## 2018-05-16 HISTORY — PX: ORIF ANKLE FRACTURE: SHX5408

## 2018-05-16 HISTORY — PX: ORIF ANKLE FRACTURE: SUR919

## 2018-05-16 LAB — GLUCOSE, CAPILLARY
Glucose-Capillary: 188 mg/dL — ABNORMAL HIGH (ref 70–99)
Glucose-Capillary: 203 mg/dL — ABNORMAL HIGH (ref 70–99)
Glucose-Capillary: 224 mg/dL — ABNORMAL HIGH (ref 70–99)
Glucose-Capillary: 399 mg/dL — ABNORMAL HIGH (ref 70–99)
Glucose-Capillary: 306 mg/dL — ABNORMAL HIGH (ref 70–99)
Glucose-Capillary: 246 mg/dL — ABNORMAL HIGH (ref 70–99)

## 2018-05-16 LAB — BASIC METABOLIC PANEL
Anion gap: 14 (ref 5–15)
BUN: 19 mg/dL (ref 8–23)
CO2: 28 mmol/L (ref 22–32)
Calcium: 9.7 mg/dL (ref 8.9–10.3)
Chloride: 99 mmol/L (ref 98–111)
Creatinine, Ser: 1.98 mg/dL — ABNORMAL HIGH (ref 0.44–1.00)
GFR calc Af Amer: 29 mL/min — ABNORMAL LOW (ref >60)
GFR calc non Af Amer: 25 mL/min — ABNORMAL LOW (ref >60)
Glucose, Bld: 205 mg/dL — ABNORMAL HIGH (ref 70–99)
Potassium: 3 mmol/L — ABNORMAL LOW (ref 3.5–5.1)
Sodium: 141 mmol/L (ref 135–145)

## 2018-05-16 LAB — PROTIME-INR
INR: 1.2 (ref 0.8–1.2)
Prothrombin Time: 14.6 seconds (ref 11.4–15.2)

## 2018-05-16 LAB — CBC
HCT: 39 % (ref 36.0–46.0)
Hemoglobin: 12.9 g/dL (ref 12.0–15.0)
MCH: 32.3 pg (ref 26.0–34.0)
MCHC: 33.1 g/dL (ref 30.0–36.0)
MCV: 97.5 fL (ref 80.0–100.0)
Platelets: 197 10*3/uL (ref 150–400)
RBC: 4 MIL/uL (ref 3.87–5.11)
RDW: 15.2 % (ref 11.5–15.5)
WBC: 12.2 10*3/uL — ABNORMAL HIGH (ref 4.0–10.5)
nRBC: 0 % (ref 0.0–0.2)

## 2018-05-16 SURGERY — OPEN REDUCTION INTERNAL FIXATION (ORIF) ANKLE FRACTURE
Anesthesia: General | Site: Ankle | Laterality: Left

## 2018-05-16 MED ORDER — LOSARTAN POTASSIUM 25 MG PO TABS
12.5000 mg | ORAL_TABLET | Freq: Every day | ORAL | Status: DC
  Administered 2018-05-16 – 2018-05-19 (×4): 12.5 mg via ORAL
  Filled 2018-05-16 (×4): qty 1

## 2018-05-16 MED ORDER — BUPROPION HCL ER (XL) 150 MG PO TB24
150.0000 mg | ORAL_TABLET | Freq: Every day | ORAL | Status: DC
  Administered 2018-05-17 – 2018-05-19 (×3): 150 mg via ORAL
  Filled 2018-05-16 (×3): qty 1

## 2018-05-16 MED ORDER — HYDROCODONE-ACETAMINOPHEN 7.5-325 MG PO TABS
1.0000 | ORAL_TABLET | ORAL | Status: DC | PRN
  Administered 2018-05-17 (×2): 2 via ORAL
  Filled 2018-05-16 (×2): qty 2

## 2018-05-16 MED ORDER — KETOROLAC TROMETHAMINE 30 MG/ML IJ SOLN: 15.0000 mg | Freq: Once | INTRAMUSCULAR | Status: DC | PRN

## 2018-05-16 MED ORDER — ROPIVACAINE HCL 5 MG/ML IJ SOLN
INTRAMUSCULAR | Status: DC | PRN
  Administered 2018-05-16: 30 mL via PERINEURAL

## 2018-05-16 MED ORDER — DOCUSATE SODIUM 100 MG PO CAPS
100.0000 mg | ORAL_CAPSULE | Freq: Two times a day (BID) | ORAL | Status: DC
  Administered 2018-05-16 – 2018-05-19 (×6): 100 mg via ORAL
  Filled 2018-05-16 (×7): qty 1

## 2018-05-16 MED ORDER — MIDAZOLAM HCL 5 MG/5ML IJ SOLN
INTRAMUSCULAR | Status: DC | PRN
  Administered 2018-05-16: 2 mg via INTRAVENOUS

## 2018-05-16 MED ORDER — APIXABAN 5 MG PO TABS
5.0000 mg | ORAL_TABLET | Freq: Two times a day (BID) | ORAL | Status: DC
  Administered 2018-05-17 – 2018-05-19 (×5): 5 mg via ORAL
  Filled 2018-05-16 (×5): qty 1

## 2018-05-16 MED ORDER — GABAPENTIN 300 MG PO CAPS
600.0000 mg | ORAL_CAPSULE | Freq: Two times a day (BID) | ORAL | Status: DC
  Administered 2018-05-16 – 2018-05-19 (×6): 600 mg via ORAL
  Filled 2018-05-16 (×6): qty 2

## 2018-05-16 MED ORDER — ALPRAZOLAM 0.5 MG PO TABS
0.5000 mg | ORAL_TABLET | Freq: Two times a day (BID) | ORAL | Status: DC
  Administered 2018-05-16 – 2018-05-19 (×6): 0.5 mg via ORAL
  Filled 2018-05-16 (×6): qty 1

## 2018-05-16 MED ORDER — DEXAMETHASONE SODIUM PHOSPHATE 10 MG/ML IJ SOLN
INTRAMUSCULAR | Status: DC | PRN
  Administered 2018-05-16: 5 mg via INTRAVENOUS

## 2018-05-16 MED ORDER — TORSEMIDE 20 MG PO TABS
20.0000 mg | ORAL_TABLET | Freq: Every day | ORAL | Status: DC
  Administered 2018-05-16 – 2018-05-19 (×4): 20 mg via ORAL
  Filled 2018-05-16 (×4): qty 1

## 2018-05-16 MED ORDER — NITROGLYCERIN 0.3 MG SL SUBL
0.3000 mg | SUBLINGUAL_TABLET | SUBLINGUAL | Status: DC | PRN
  Filled 2018-05-16: qty 100

## 2018-05-16 MED ORDER — PROPOFOL 500 MG/50ML IV EMUL
INTRAVENOUS | Status: DC | PRN
  Administered 2018-05-16: 35 ug/kg/min via INTRAVENOUS

## 2018-05-16 MED ORDER — ONDANSETRON HCL 4 MG/2ML IJ SOLN
INTRAMUSCULAR | Status: DC | PRN
  Administered 2018-05-16: 4 mg via INTRAVENOUS

## 2018-05-16 MED ORDER — ONDANSETRON HCL 4 MG/2ML IJ SOLN
4.0000 mg | Freq: Four times a day (QID) | INTRAMUSCULAR | Status: DC | PRN
  Administered 2018-05-19: 10:00:00 4 mg via INTRAVENOUS
  Filled 2018-05-16: qty 2

## 2018-05-16 MED ORDER — METOPROLOL SUCCINATE ER 25 MG PO TB24
50.0000 mg | ORAL_TABLET | Freq: Every day | ORAL | Status: DC
  Administered 2018-05-17 – 2018-05-19 (×3): 50 mg via ORAL
  Filled 2018-05-16 (×3): qty 2

## 2018-05-16 MED ORDER — PROPOFOL 10 MG/ML IV BOLUS
INTRAVENOUS | Status: AC
  Filled 2018-05-16: qty 40

## 2018-05-16 MED ORDER — PROMETHAZINE HCL 25 MG/ML IJ SOLN: 6.2500 mg | INTRAMUSCULAR | Status: DC | PRN

## 2018-05-16 MED ORDER — VITAMIN B-12 1000 MCG PO TABS
1000.0000 ug | ORAL_TABLET | Freq: Every day | ORAL | Status: DC
  Administered 2018-05-16 – 2018-05-19 (×4): 1000 ug via ORAL
  Filled 2018-05-16 (×4): qty 1

## 2018-05-16 MED ORDER — METOCLOPRAMIDE HCL 5 MG/ML IJ SOLN: 5.0000 mg | Freq: Three times a day (TID) | INTRAMUSCULAR | Status: DC | PRN

## 2018-05-16 MED ORDER — VANCOMYCIN HCL 1000 MG IV SOLR
INTRAVENOUS | Status: DC | PRN
  Administered 2018-05-16: 15 mg via INTRAVENOUS
  Administered 2018-05-16: 1500 mg via INTRAVENOUS

## 2018-05-16 MED ORDER — CLOPIDOGREL BISULFATE 75 MG PO TABS
75.0000 mg | ORAL_TABLET | Freq: Every day | ORAL | Status: DC
  Administered 2018-05-17 – 2018-05-18 (×2): 75 mg via ORAL
  Filled 2018-05-16 (×2): qty 1

## 2018-05-16 MED ORDER — FENTANYL CITRATE (PF) 100 MCG/2ML IJ SOLN
INTRAMUSCULAR | Status: DC | PRN
  Administered 2018-05-16: 100 ug via INTRAVENOUS
  Administered 2018-05-16: 50 ug via INTRAVENOUS
  Administered 2018-05-16: 25 ug via INTRAVENOUS
  Administered 2018-05-16: 50 ug via INTRAVENOUS
  Administered 2018-05-16: 25 ug via INTRAVENOUS

## 2018-05-16 MED ORDER — EZETIMIBE 10 MG PO TABS
10.0000 mg | ORAL_TABLET | Freq: Every day | ORAL | Status: DC
  Administered 2018-05-16 – 2018-05-18 (×3): 10 mg via ORAL
  Filled 2018-05-16 (×3): qty 1

## 2018-05-16 MED ORDER — MIDAZOLAM HCL 2 MG/2ML IJ SOLN
INTRAMUSCULAR | Status: AC
  Filled 2018-05-16: qty 2

## 2018-05-16 MED ORDER — VANCOMYCIN HCL 10 G IV SOLR
1500.0000 mg | Freq: Once | INTRAVENOUS | Status: AC
  Administered 2018-05-16: 08:00:00 via INTRAVENOUS
  Administered 2018-05-16: 1500 mg via INTRAVENOUS
  Filled 2018-05-16: qty 1500

## 2018-05-16 MED ORDER — CLINDAMYCIN PHOSPHATE 900 MG/50ML IV SOLN: 900.0000 mg | Freq: Once | INTRAVENOUS | Status: DC

## 2018-05-16 MED ORDER — POTASSIUM CHLORIDE CRYS ER 20 MEQ PO TBCR
20.0000 meq | EXTENDED_RELEASE_TABLET | Freq: Two times a day (BID) | ORAL | Status: DC
  Administered 2018-05-16 – 2018-05-19 (×7): 20 meq via ORAL
  Filled 2018-05-16 (×7): qty 1

## 2018-05-16 MED ORDER — KETAMINE HCL 10 MG/ML IJ SOLN
INTRAMUSCULAR | Status: DC | PRN
  Administered 2018-05-16: 20 mg via INTRAVENOUS
  Administered 2018-05-16: 30 mg via INTRAVENOUS

## 2018-05-16 MED ORDER — LIDOCAINE-EPINEPHRINE (PF) 1.5 %-1:200000 IJ SOLN
INTRAMUSCULAR | Status: DC | PRN
  Administered 2018-05-16: 10 mL via PERINEURAL

## 2018-05-16 MED ORDER — KETAMINE HCL 50 MG/5ML IJ SOSY
PREFILLED_SYRINGE | INTRAMUSCULAR | Status: AC
  Filled 2018-05-16: qty 5

## 2018-05-16 MED ORDER — MORPHINE SULFATE (PF) 2 MG/ML IV SOLN: 0.5000 mg | INTRAVENOUS | Status: DC | PRN

## 2018-05-16 MED ORDER — INSULIN ASPART 100 UNIT/ML ~~LOC~~ SOLN
0.0000 [IU] | Freq: Every day | SUBCUTANEOUS | Status: DC
  Administered 2018-05-16 – 2018-05-17 (×2): 2 [IU] via SUBCUTANEOUS

## 2018-05-16 MED ORDER — DULOXETINE HCL 30 MG PO CPEP
30.0000 mg | ORAL_CAPSULE | Freq: Every day | ORAL | Status: DC
  Administered 2018-05-17 – 2018-05-19 (×3): 30 mg via ORAL
  Filled 2018-05-16 (×3): qty 1

## 2018-05-16 MED ORDER — ROSUVASTATIN CALCIUM 20 MG PO TABS
40.0000 mg | ORAL_TABLET | Freq: Every day | ORAL | Status: DC
  Administered 2018-05-16 – 2018-05-18 (×3): 40 mg via ORAL
  Filled 2018-05-16 (×3): qty 2

## 2018-05-16 MED ORDER — ONDANSETRON HCL 4 MG PO TABS: 4.0000 mg | ORAL_TABLET | Freq: Four times a day (QID) | ORAL | Status: DC | PRN

## 2018-05-16 MED ORDER — TRAMADOL HCL 50 MG PO TABS
50.0000 mg | ORAL_TABLET | Freq: Four times a day (QID) | ORAL | Status: DC
  Administered 2018-05-16 – 2018-05-19 (×12): 50 mg via ORAL
  Filled 2018-05-16 (×12): qty 1

## 2018-05-16 MED ORDER — FENTANYL CITRATE (PF) 250 MCG/5ML IJ SOLN
INTRAMUSCULAR | Status: AC
  Filled 2018-05-16: qty 5

## 2018-05-16 MED ORDER — ONDANSETRON HCL 4 MG/2ML IJ SOLN
INTRAMUSCULAR | Status: AC
  Filled 2018-05-16: qty 2

## 2018-05-16 MED ORDER — BUPIVACAINE HCL (PF) 0.25 % IJ SOLN
INTRAMUSCULAR | Status: AC
  Filled 2018-05-16: qty 30

## 2018-05-16 MED ORDER — HYDROCODONE-ACETAMINOPHEN 5-325 MG PO TABS: 1.0000 | ORAL_TABLET | ORAL | Status: DC | PRN

## 2018-05-16 MED ORDER — LACTATED RINGERS IV SOLN
INTRAVENOUS | Status: DC | PRN
  Administered 2018-05-16 (×2): via INTRAVENOUS

## 2018-05-16 MED ORDER — ACETAMINOPHEN 500 MG PO TABS
500.0000 mg | ORAL_TABLET | Freq: Four times a day (QID) | ORAL | Status: AC
  Administered 2018-05-16 (×3): 500 mg via ORAL
  Filled 2018-05-16 (×3): qty 1

## 2018-05-16 MED ORDER — NITROGLYCERIN 0.4 MG SL SUBL: 0.4000 mg | SUBLINGUAL_TABLET | SUBLINGUAL | Status: DC | PRN

## 2018-05-16 MED ORDER — CHLORHEXIDINE GLUCONATE 4 % EX LIQD: 60.0000 mL | Freq: Once | CUTANEOUS | Status: DC

## 2018-05-16 MED ORDER — PANTOPRAZOLE SODIUM 40 MG PO TBEC
40.0000 mg | DELAYED_RELEASE_TABLET | Freq: Every day | ORAL | Status: DC
  Administered 2018-05-17 – 2018-05-19 (×3): 40 mg via ORAL
  Filled 2018-05-16 (×3): qty 1

## 2018-05-16 MED ORDER — HYDROMORPHONE HCL 1 MG/ML IJ SOLN: 0.2500 mg | INTRAMUSCULAR | Status: DC | PRN

## 2018-05-16 MED ORDER — INSULIN ASPART 100 UNIT/ML ~~LOC~~ SOLN
0.0000 [IU] | Freq: Three times a day (TID) | SUBCUTANEOUS | Status: DC
  Administered 2018-05-16: 5 [IU] via SUBCUTANEOUS
  Administered 2018-05-16: 15 [IU] via SUBCUTANEOUS
  Administered 2018-05-17: 17:00:00 5 [IU] via SUBCUTANEOUS
  Administered 2018-05-17: 09:00:00 2 [IU] via SUBCUTANEOUS
  Administered 2018-05-17: 5 [IU] via SUBCUTANEOUS
  Administered 2018-05-18: 3 [IU] via SUBCUTANEOUS
  Administered 2018-05-18: 2 [IU] via SUBCUTANEOUS
  Administered 2018-05-18: 5 [IU] via SUBCUTANEOUS
  Administered 2018-05-19 (×2): 3 [IU] via SUBCUTANEOUS

## 2018-05-16 MED ORDER — ACETAMINOPHEN 325 MG PO TABS: 325.0000 mg | ORAL_TABLET | Freq: Four times a day (QID) | ORAL | Status: DC | PRN

## 2018-05-16 MED ORDER — METOCLOPRAMIDE HCL 5 MG PO TABS: 5.0000 mg | ORAL_TABLET | Freq: Three times a day (TID) | ORAL | Status: DC | PRN

## 2018-05-16 MED ORDER — CLINDAMYCIN PHOSPHATE 600 MG/50ML IV SOLN
600.0000 mg | Freq: Four times a day (QID) | INTRAVENOUS | Status: AC
  Administered 2018-05-16 – 2018-05-17 (×3): 600 mg via INTRAVENOUS
  Filled 2018-05-16 (×3): qty 50

## 2018-05-16 MED ORDER — BUDESONIDE 0.25 MG/2ML IN SUSP
0.2500 mg | Freq: Two times a day (BID) | RESPIRATORY_TRACT | Status: DC
  Administered 2018-05-16 – 2018-05-19 (×6): 0.25 mg via RESPIRATORY_TRACT
  Filled 2018-05-16 (×6): qty 2

## 2018-05-16 MED ORDER — IPRATROPIUM-ALBUTEROL 0.5-2.5 (3) MG/3ML IN SOLN
3.0000 mL | Freq: Two times a day (BID) | RESPIRATORY_TRACT | Status: DC
  Administered 2018-05-16 – 2018-05-17 (×2): 3 mL via RESPIRATORY_TRACT
  Filled 2018-05-16 (×2): qty 3

## 2018-05-16 MED ORDER — DEXAMETHASONE SODIUM PHOSPHATE 10 MG/ML IJ SOLN
INTRAMUSCULAR | Status: AC
  Filled 2018-05-16: qty 1

## 2018-05-16 MED ORDER — 0.9 % SODIUM CHLORIDE (POUR BTL) OPTIME
TOPICAL | Status: DC | PRN
  Administered 2018-05-16: 1000 mL

## 2018-05-16 MED ORDER — SPIRONOLACTONE 25 MG PO TABS
25.0000 mg | ORAL_TABLET | Freq: Every day | ORAL | Status: DC
  Administered 2018-05-16 – 2018-05-19 (×4): 25 mg via ORAL
  Filled 2018-05-16 (×4): qty 1

## 2018-05-16 SURGICAL SUPPLY — 67 items
ALCOHOL 70% 16 OZ (MISCELLANEOUS) ×2 IMPLANT
BANDAGE ACE 4X5 VEL STRL LF (GAUZE/BANDAGES/DRESSINGS) IMPLANT
BANDAGE ACE 6X5 VEL STRL LF (GAUZE/BANDAGES/DRESSINGS) IMPLANT
BANDAGE ELASTIC 4 VELCRO ST LF (GAUZE/BANDAGES/DRESSINGS) ×2 IMPLANT
BANDAGE ELASTIC 6 VELCRO ST LF (GAUZE/BANDAGES/DRESSINGS) ×2 IMPLANT
BANDAGE ESMARK 6X9 LF (GAUZE/BANDAGES/DRESSINGS) IMPLANT
BNDG COHESIVE 4X5 TAN STRL (GAUZE/BANDAGES/DRESSINGS) ×4 IMPLANT
BNDG ESMARK 6X9 LF (GAUZE/BANDAGES/DRESSINGS)
CANISTER SUCT 3000ML PPV (MISCELLANEOUS) ×2 IMPLANT
COVER SURGICAL LIGHT HANDLE (MISCELLANEOUS) ×2 IMPLANT
COVER WAND RF STERILE (DRAPES) ×2 IMPLANT
CUFF TOURNIQUET SINGLE 34IN LL (TOURNIQUET CUFF) IMPLANT
DRAPE C-ARM 42X72 X-RAY (DRAPES) ×2 IMPLANT
DRAPE C-ARMOR (DRAPES) ×2 IMPLANT
DRAPE ORTHO SPLIT 77X108 STRL (DRAPES) ×1
DRAPE SURG ORHT 6 SPLT 77X108 (DRAPES) ×1 IMPLANT
DRAPE U-SHAPE 47X51 STRL (DRAPES) ×2 IMPLANT
DRESSING AQUACEL AQ EXTRA 4X5 (GAUZE/BANDAGES/DRESSINGS) ×2 IMPLANT
DRSG ADAPTIC 3X8 NADH LF (GAUZE/BANDAGES/DRESSINGS) ×2 IMPLANT
DRSG AQUACEL AG ADV 3.5X 4 (GAUZE/BANDAGES/DRESSINGS) ×2 IMPLANT
DRSG PAD ABDOMINAL 8X10 ST (GAUZE/BANDAGES/DRESSINGS) ×2 IMPLANT
DURAPREP 26ML APPLICATOR (WOUND CARE) ×2 IMPLANT
ELECT REM PT RETURN 9FT ADLT (ELECTROSURGICAL) ×2
ELECTRODE REM PT RTRN 9FT ADLT (ELECTROSURGICAL) ×1 IMPLANT
FIBULOCK IMPLANT SYSTEM STER (Miscellaneous) ×2 IMPLANT
GAUZE SPONGE 4X4 12PLY STRL (GAUZE/BANDAGES/DRESSINGS) ×2 IMPLANT
GAUZE SPONGE 4X4 12PLY STRL LF (GAUZE/BANDAGES/DRESSINGS) ×4 IMPLANT
GLOVE BIO SURGEON STRL SZ7.5 (GLOVE) ×2 IMPLANT
GLOVE BIOGEL PI IND STRL 7.5 (GLOVE) ×1 IMPLANT
GLOVE BIOGEL PI IND STRL 8 (GLOVE) ×1 IMPLANT
GLOVE BIOGEL PI INDICATOR 7.5 (GLOVE) ×1
GLOVE BIOGEL PI INDICATOR 8 (GLOVE) ×1
GLOVE ECLIPSE 7.0 STRL STRAW (GLOVE) ×2 IMPLANT
GOWN STRL REUS W/ TWL LRG LVL3 (GOWN DISPOSABLE) ×2 IMPLANT
GOWN STRL REUS W/ TWL XL LVL3 (GOWN DISPOSABLE) ×1 IMPLANT
GOWN STRL REUS W/TWL LRG LVL3 (GOWN DISPOSABLE) ×2
GOWN STRL REUS W/TWL XL LVL3 (GOWN DISPOSABLE) ×1
IMPL TIGHTROP W/DRV K-LESS (Anchor) ×2 IMPLANT
IMPLANT TIGHTROPE W/DRV K-LESS (Anchor) ×4 IMPLANT
KIT BASIN OR (CUSTOM PROCEDURE TRAY) ×2 IMPLANT
KIT TURNOVER KIT B (KITS) ×2 IMPLANT
NAIL FIBULOCK 3.8X130 LT (Nail) ×2 IMPLANT
NEEDLE HYPO 25GX1X1/2 BEV (NEEDLE) IMPLANT
NS IRRIG 1000ML POUR BTL (IV SOLUTION) ×2 IMPLANT
PACK ORTHO EXTREMITY (CUSTOM PROCEDURE TRAY) ×2 IMPLANT
PAD ABD 8X10 STRL (GAUZE/BANDAGES/DRESSINGS) ×6 IMPLANT
PAD ARMBOARD 7.5X6 YLW CONV (MISCELLANEOUS) ×4 IMPLANT
PAD CAST 4YDX4 CTTN HI CHSV (CAST SUPPLIES) ×2 IMPLANT
PADDING CAST COTTON 4X4 STRL (CAST SUPPLIES) ×2
PADDING CAST COTTON 6X4 STRL (CAST SUPPLIES) ×2 IMPLANT
REAMER CANN LNG 4.0 STRL (BIT) ×1 IMPLANT
REAMER LONG CANNULATED 4.0 S (BIT) ×1
SCREW CORTEX LP TM SS 2.7X16 (Screw) ×4 IMPLANT
SPLINT FIBERGLASS 4X30 (CAST SUPPLIES) ×2 IMPLANT
SPONGE LAP 18X18 RF (DISPOSABLE) IMPLANT
SUCTION FRAZIER HANDLE 10FR (MISCELLANEOUS) ×1
SUCTION TUBE FRAZIER 10FR DISP (MISCELLANEOUS) ×1 IMPLANT
SUT ETHILON 3 0 PS 1 (SUTURE) ×2 IMPLANT
SUT VIC AB 0 CT1 27 (SUTURE)
SUT VIC AB 0 CT1 27XBRD ANBCTR (SUTURE) IMPLANT
SUT VIC AB 2-0 CT1 27 (SUTURE) ×3
SUT VIC AB 2-0 CT1 TAPERPNT 27 (SUTURE) ×3 IMPLANT
SYR CONTROL 10ML LL (SYRINGE) IMPLANT
SYSTEM IMPLANT FIBULOCK STRL (Miscellaneous) ×1 IMPLANT
TOWEL OR 17X26 10 PK STRL BLUE (TOWEL DISPOSABLE) ×4 IMPLANT
TUBE CONNECTING 12X1/4 (SUCTIONS) ×2 IMPLANT
YANKAUER SUCT BULB TIP NO VENT (SUCTIONS) ×2 IMPLANT

## 2018-05-16 NOTE — Brief Op Note (Signed)
05/16/2018  9:32 AM  PATIENT:  [name] [name]  73 y.o. female  PRE-OPERATIVE DIAGNOSIS:  left ankle bimalleolar fracture  POST-OPERATIVE DIAGNOSIS:  left ankle bimalleolar fracture  PROCEDURE:  Procedure(s) with comments: OPEN REDUCTION INTERNAL FIXATION (ORIF) Left ankle with possible syndesmosis fixation (Left) - 171min  SURGEON:  Surgeon(s) and Role:    * Nicholes Stairs, MD - Primary  PHYSICIAN ASSISTANT:   ASSISTANTS: Alecia Lemming, RNFA   ANESTHESIA:   regional and IV sedation  EBL:  75 mL   BLOOD ADMINISTERED:none  DRAINS: none   LOCAL MEDICATIONS USED:  NONE  SPECIMEN:  No Specimen  DISPOSITION OF SPECIMEN:  N/A  COUNTS:  YES  TOURNIQUET:   Total Tourniquet Time Documented: Thigh (Left) - 38 minutes Total: Thigh (Left) - 38 minutes   DICTATION: .Viviann Spare Dictation  PLAN OF CARE: Admit to inpatient   PATIENT DISPOSITION:  PACU - hemodynamically stable.   Delay start of Pharmacological VTE agent (>24hrs) due to surgical blood loss or risk of bleeding: not applicable

## 2018-05-16 NOTE — Progress Notes (Signed)
Multiple attempts have been made to review patient's medication without success.

## 2018-05-16 NOTE — Progress Notes (Signed)
Medications reviewed with Iris, nurse at [facility].

## 2018-05-16 NOTE — Discharge Instructions (Addendum)
Information on my medicine - ELIQUIS (apixaban)  Why was Eliquis prescribed for you? Eliquis was prescribed for you to reduce the risk of a blood clot forming that can cause a stroke if you have a medical condition called atrial fibrillation (a type of irregular heartbeat).  What do You need to know about Eliquis ? Take your Eliquis TWICE DAILY - one tablet in the morning and one tablet in the evening with or without food. If you have difficulty swallowing the tablet whole please discuss with your pharmacist how to take the medication safely.  Take Eliquis exactly as prescribed by your doctor and DO NOT stop taking Eliquis without talking to the doctor who prescribed the medication.  Stopping may increase your risk of developing a stroke.  Refill your prescription before you run out.  After discharge, you should have regular check-up appointments with your healthcare provider that is prescribing your Eliquis.  In the future your dose may need to be changed if your kidney function or weight changes by a significant amount or as you get older.  What do you do if you miss a dose? If you miss a dose, take it as soon as you remember on the same day and resume taking twice daily.  Do not take more than one dose of ELIQUIS at the same time to make up a missed dose.  Important Safety Information A possible side effect of Eliquis is bleeding. You should call your healthcare provider right away if you experience any of the following: ? Bleeding from an injury or your nose that does not stop. ? Unusual colored urine (red or dark brown) or unusual colored stools (red or black). ? Unusual bruising for unknown reasons. ? A serious fall or if you hit your head (even if there is no bleeding).  Some medicines may interact with Eliquis and might increase your risk of bleeding or clotting while on Eliquis. To help avoid this, consult your healthcare provider or pharmacist prior to using any new  prescription or non-prescription medications, including herbals, vitamins, non-steroidal anti-inflammatory drugs (NSAIDs) and supplements.  This website has more information on Eliquis (apixaban): http://www.eliquis.com/eliquis/home   Orthopedic discharge instructions: -No weightbearing to the left leg. -Maintain your splint clean, and dry at all times.  Do not get wet. -  elevate your left leg with "toes above nose." -For mild to moderate pain use Tylenol and tramadol as directed.  For breakthrough pain use oxycodone as directed. -Return to see Dr. Stann Mainland in 2 weeks for splint and suture removal and wound check.

## 2018-05-16 NOTE — Transfer of Care (Signed)
Immediate Anesthesia Transfer of Care Note  Patient: [name] [name]  Procedure(s) Performed: OPEN REDUCTION INTERNAL FIXATION (ORIF) Left ankle with possible syndesmosis fixation (Left Ankle)  Patient Location: PACU  Anesthesia Type:MAC and Regional  Level of Consciousness: awake, alert , oriented and sedated  Airway & Oxygen Therapy: Patient Spontanous Breathing and Patient connected to nasal cannula oxygen  Post-op Assessment: Report given to RN, Post -op Vital signs reviewed and stable and Patient moving all extremities  Post vital signs: Reviewed and stable  Last Vitals:  Vitals Value Taken Time  BP 116/52 05/16/2018  9:55 AM  Temp    Pulse 57 05/16/2018  9:57 AM  Resp 18 05/16/2018  9:57 AM  SpO2 100 % 05/16/2018  9:57 AM  Vitals shown include unvalidated device data.  Last Pain:  Vitals:   05/16/18 0634  TempSrc:   PainSc: 4       Patients Stated Pain Goal: 5 (25/00/37 0488)  Complications: No apparent anesthesia complications

## 2018-05-16 NOTE — Progress Notes (Signed)
Inpatient Diabetes Program Recommendations  AACE/ADA: New Consensus Statement on Inpatient Glycemic Control  Target Ranges:  Prepandial:   less than 140 mg/dL      Peak postprandial:   less than 180 mg/dL (1-2 hours)      Critically ill patients:  140 - 180 mg/dL   Results for RICKESHA, VERACRUZ ([name] [id number]) as of 05/16/2018 11:55  Ref. Range 05/16/2018 05:55 05/16/2018 09:54 05/16/2018 11:26  Glucose-Capillary Latest Ref Range: 70 - 99 mg/dL 188 (H) 203 (H) 224 (H)   Review of Glycemic Control  Diabetes history: DM2 Outpatient Diabetes medications: Lantus 35 units QH, Novolog 8 units TID with meals Current orders for Inpatient glycemic control: Novolog 0-15 units TID with meals, Novolog 0-5 units QHS  Inpatient Diabetes Program Recommendations:   Insulin - Basal: Per home medication list, patient took Lantus 35 units at home last on 05/15/18. Please consider ordering at least Lantus 17 units QHS (approximately 50% of outpatient dose).  Insulin - Meal Coverage: May need meal coverage if post prandial glucose is consistently greater than 180 mg/dl. If so, would recommend Novolog 3 units TID with meals for meal coverage if patient eats at least 50% of meals.  NOTE: Noted patient received Decadron 5 mg x1 today.   Thanks, Barnie Alderman, RN, MSN, CDE Diabetes Coordinator Inpatient Diabetes Program [phone] (Team Pager from 8am to 5pm)

## 2018-05-16 NOTE — Anesthesia Procedure Notes (Signed)
Anesthesia Regional Block: Popliteal block   Pre-Anesthetic Checklist: ,, timeout performed, Correct Patient, Correct Site, Correct Laterality, Correct Procedure, Correct Position, site marked, Risks and benefits discussed,  Surgical consent,  Pre-op evaluation,  At surgeon's request and post-op pain management  Laterality: Left  Prep: chloraprep       Needles:  Injection technique: Single-shot  Needle Type: Echogenic Needle     Needle Length: 9cm      Additional Needles:   Procedures:,,,, ultrasound used (permanent image in chart),,,,  Narrative:  Start time: 05/16/2018 7:20 AM End time: 05/16/2018 7:29 AM Injection made incrementally with aspirations every 5 mL.  Events: blood aspirated,,,,,,,,,,  Performed by: Personally  Anesthesiologist: Myrtie Soman, MD  Additional Notes: Patient tolerated the procedure well without complications

## 2018-05-16 NOTE — Progress Notes (Signed)
Pt. Has a rash on left lateral arm below elbow, 12 cm X 7 cm;red . Pt. Reported the rash was present on her last hospital admission. States that it itches at times and no treatments have been started at present.

## 2018-05-16 NOTE — Anesthesia Preprocedure Evaluation (Addendum)
Anesthesia Evaluation  Patient identified by [name], date of birth, ID band Patient awake    Reviewed: Allergy & Precautions, NPO status , Patient's Chart, lab work & pertinent test results  History of Anesthesia Complications (+) PONV  Airway Mallampati: III  TM Distance: <3 FB Neck ROM: Full    Dental no notable dental hx.    Pulmonary sleep apnea and Oxygen sleep apnea ,    Pulmonary exam normal breath sounds clear to auscultation + decreased breath sounds      Cardiovascular hypertension, + CAD, + Past MI and + Cardiac Stents  Normal cardiovascular exam Rhythm:Regular Rate:Normal     Neuro/Psych TIACVA negative psych ROS   GI/Hepatic Neg liver ROS, GERD  ,  Endo/Other  diabetesMorbid obesity  Renal/GU negative Renal ROS  negative genitourinary   Musculoskeletal negative musculoskeletal ROS (+)   Abdominal   Peds negative pediatric ROS (+)  Hematology negative hematology ROS (+)   Anesthesia Other Findings   Reproductive/Obstetrics negative OB ROS                            Anesthesia Physical Anesthesia Plan  ASA: IV  Anesthesia Plan: MAC   Post-op Pain Management:  Regional for Post-op pain   Induction: Intravenous  PONV Risk Score and Plan: 4 or greater and Ondansetron, Dexamethasone and Treatment may vary due to age or medical condition  Airway Management Planned: Simple Face Mask  Additional Equipment:   Intra-op Plan:   Post-operative Plan:   Informed Consent: I have reviewed the patients History and Physical, chart, labs and discussed the procedure including the risks, benefits and alternatives for the proposed anesthesia with the patient or authorized representative who has indicated his/her understanding and acceptance.     Dental advisory given  Plan Discussed with: CRNA and Surgeon  Anesthesia Plan Comments:        Anesthesia Quick Evaluation

## 2018-05-16 NOTE — Anesthesia Postprocedure Evaluation (Signed)
Anesthesia Post Note  Patient: [name] [name]  Procedure(s) Performed: OPEN REDUCTION INTERNAL FIXATION (ORIF) Left ankle with possible syndesmosis fixation (Left Ankle)     Patient location during evaluation: PACU Anesthesia Type: General Level of consciousness: awake and alert Pain management: pain level controlled Vital Signs Assessment: post-procedure vital signs reviewed and stable Respiratory status: spontaneous breathing, nonlabored ventilation, respiratory function stable and patient connected to nasal cannula oxygen Cardiovascular status: blood pressure returned to baseline and stable Postop Assessment: no apparent nausea or vomiting Anesthetic complications: no    Last Vitals:  Vitals:   05/16/18 1040 05/16/18 1053  BP: (!) 128/48 (!) 133/44  Pulse: (!) 57 (!) 57  Resp: 13 16  Temp: (!) 36.1 C   SpO2: 99% 100%    Last Pain:  Vitals:   05/16/18 1025  TempSrc:   PainSc: 0-No pain                 Kirkland Figg S

## 2018-05-16 NOTE — H&P (Signed)
ORTHOPAEDIC CONSULTATION  REQUESTING PHYSICIAN: [name] Stairs, MD  PCP:  [name] Late, MD  Chief Complaint: left ankle fracture  HPI: [name] [name] is a 73 y.o. female who complains of  [name] [name] presents today for her left ankle surgery.  She sustained a ground-level fall last week and presented to my office with an unstable left ankle fracture.  She was placed in a splint at the time of presentation.   She is indicated for operative fixation due to the unstable nature of this injury as well as concomitant syndesmotic disruption.  She has multiple medical problems and has been in a skilled nursing facility of [name] to regain functional independence.  No new complaints today she denies numbness or tingling.  She was recently diagnosed with anesthesia about 5 days ago but has been treated with antibiotics and denies any shortness of breath.  Past Medical History:  Diagnosis Date  . Abnormality of gait 05/14/2014  . Anemia    hx  . Anginal pain ([place])   . Anxiety   . Asthma   . Basal cell carcinoma 05/2014   "left shoulder"  . Bundle branch block, left    chronic/notes 07/18/2013  . CHF (congestive heart failure) ([place])   . Chronic bronchitis ([place])    "off and on all the time" (07/18/2013)  . Chronic insomnia 05/06/2015  . Chronic kidney disease    frequency, sees dr Jamal Maes every 4 to 6 months (01/16/2018)  . Chronic low back pain 08/24/2016  . Chronic lower back pain   . Claustrophobia   . Common migraine 05/14/2014  . Coronary artery disease   . Depression   . Dysrhythmia   . GERD (gastroesophageal reflux disease)   . H/O hiatal hernia   . Headache    "at least 2/month" (01/16/2018)  . Heart murmur   . Hyperlipidemia   . Hypertension   . Irregular heart beat   . Leg cramps    both legs at times  . Melanoma ([place]) 05/2014   "burned off BLE" (01/16/2018)  . Memory change 05/14/2014  . Migraine    "5-6/year"  (01/16/2018)  . Myocardial infarction ([place])  04/1999, 02/2000, 01/2005; 2011; 2014  . Neuromuscular disorder ([place])    ?  . Obesity 01-2010  . Obstructive sleep apnea    "can't wear machine; I have claustrophobia" (01/16/2018), states she had a 2nd sleep study and does not have sleep apnea, her O2 decreases and now is on 2 L of O2 at night.  . On home oxygen therapy    "2L at night and prn during daytime" (01/16/2018)  . Osteoarthritis    "knees and hands" (01/16/2018)  . Other and unspecified angina pectoris   . Peripheral vascular disease (H[facility])    ? numbness, tingling arms and legs  . Pneumonia 2000's   "once"  . PONV (postoperative nausea and vomiting)   . Shortness of breath    with exertion  . Stroke ([place]) 03/22/12   right side brain; denies residual on 07/18/2013)  . Stroke [facility]) Oct. 2015   Affected pt.s balance  . Stroke [facility]) 10/2014   "affected my legs; fully recovered now"; still have sporatic memory issues from this one" (01/16/2018)  . Type II diabetes mellitus ([place])   . Ventral hernia    hx of  . Vomiting    persistent  . Vomiting blood    Past Surgical History:  Procedure Laterality Date  . ANKLE FRACTURE SURGERY Left 1970's  .  APPENDECTOMY  1970's   w/hysterectomy  . BASAL CELL CARCINOMA EXCISION Left 05/2014   "shoulder" (01/16/2018)  . CARDIAC CATHETERIZATION  10/10/2012   Dr [name].  Marland Kitchen CARDIAC CATHETERIZATION N/A 05/29/2015   Procedure: Right/Left Heart Cath and Coronary/Graft Angiography;  Surgeon: Larey Dresser, MD;  Location: [place] [facility];  Service: Cardiovascular;  Laterality: N/A;  . CAROTID ENDARTERECTOMY Left 03/2013  . CAROTID STENT INSERTION Left 03/20/2013   Procedure: CAROTID STENT INSERTION;  Surgeon: [name], MD;  Location[facility] CATH LAB;  Service: Cardiovascular;  Laterality: Left;  internal carotid  . CEREBRAL ANGIOGRAM N/A 04/05/2011   Procedure: CEREBRAL ANGIOGRAM;  Surgeon: Angelia Mould, MD;  Location: [facility] CATH LAB;  Service: Cardiovascular;  Laterality: N/A;  .  CHOLECYSTECTOMY OPEN  2004  . CORONARY ANGIOPLASTY WITH STENT PLACEMENT  01,02,05,06,07,08,11; 04/24/2013   "I've probably got ~ 10 stents by now" (04/24/2013)  . CORONARY ANGIOPLASTY WITH STENT PLACEMENT  06/13/2013   "got 4 stents today" (06/13/2013)  . CORONARY ARTERY BYPASS GRAFT  1220/11   "CABG X5"  . CORONARY STENT INTERVENTION N/A 10/20/2017   Procedure: CORONARY STENT INTERVENTION;  Surgeon: Troy Sine, MD;  Location: [place] [facility] LAB;  Service: Cardiovascular;  Laterality: N/A;  . ESOPHAGOGASTRODUODENOSCOPY  08/03/2011   Procedure: ESOPHAGOGASTRODUODENOSCOPY (EGD);  Surgeon: Shann Medal, MD;  Location: [facility];  Service: General;  Laterality: N/A;  . ESOPHAGOGASTRODUODENOSCOPY (EGD) WITH PROPOFOL N/A 03/11/2014   Procedure: ESOPHAGOGASTRODUODENOSCOPY (EGD) WITH PROPOFOL;  Surgeon: Lafayette Dragon, MD;  Location: [facility];  Service: Endoscopy;  Laterality: N/A;  . FRACTURE SURGERY    . gall stone removal  05/2003  . GASTRIC RESTRICTION SURGERY  1984   "stapeling"  . HERNIA REPAIR  2004   "in my stomach; had OR on it twice", wire mesh on 1 hernia  . LEFT HEART CATH AND CORS/GRAFTS ANGIOGRAPHY N/A 07/09/2016   Procedure: LEFT HEART CATH AND CORS/GRAFTS ANGIOGRAPHY;  Surgeon: Larey Dresser, MD;  Location: [place] [facility];  Service: Cardiovascular;  Laterality: N/A;  . OVARY SURGERY  1970's   "tumor removed"  . PERCUTANEOUS CORONARY STENT INTERVENTION (PCI-S) N/A 06/13/2013   Procedure: PERCUTANEOUS CORONARY STENT INTERVENTION (PCI-S);  Surgeon: [name], MD;  Location: [facility] CATH LAB;  Service: Cardiovascular;  Laterality: N/A;  . PERCUTANEOUS STENT INTERVENTION N/A 04/24/2013   Procedure: PERCUTANEOUS STENT INTERVENTION;  Surgeon: [name], MD;  Location: [facility] CATH LAB;  Service: Cardiovascular;  Laterality: N/A;  . RIGHT/LEFT HEART CATH AND CORONARY/GRAFT ANGIOGRAPHY N/A 10/20/2017   Procedure: RIGHT/LEFT HEART CATH AND CORONARY/GRAFT ANGIOGRAPHY;   Surgeon: Larey Dresser, MD;  Location: [place] CV LAB;  Service: Cardiovascular;  Laterality: N/A;  . ROOT CANAL  10/2000  . TIBIA FRACTURE SURGERY Right 1970's   rods and pins  . TOOTH EXTRACTION     "1 on the upper; wisdom tooth on the lower" (01/16/2018)  . TOTAL ABDOMINAL HYSTERECTOMY  1970's   w/ appendectomy   Social History   Socioeconomic History  . Marital status: Married    Spouse name: [name]   . Number of children: 1  . Years of education: 12+  . Highest education level: Not on file  Occupational History  . Occupation: retired  Scientific laboratory technician  . Financial resource strain: Not on file  . Food insecurity:    Worry: Not on file    Inability: Not on file  . Transportation needs:    Medical: Not on file  Non-medical: Not on file  Tobacco Use  . Smoking status: Never Smoker  . Smokeless tobacco: Never Used  Substance and Sexual Activity  . Alcohol use: Never    Frequency: Never  . Drug use: Never  . Sexual activity: Not Currently    Birth control/protection: Surgical    Comment: hysterectomy  Lifestyle  . Physical activity:    Days per week: Not on file    Minutes per session: Not on file  . Stress: Not on file  Relationships  . Social connections:    Talks on phone: Not on file    Gets together: Not on file    Attends religious service: Not on file    Active member of club or organization: Not on file    Attends meetings of clubs or organizations: Not on file    Relationship status: Not on file  Other Topics Concern  . Not on file  Social History Narrative   Patient lives at home with husband [name].    Patient has 1 child.    Patient is not currently working.    Patient has 2 years of college.    Patient is right handed.   Patient drinks caffeine occasionally.   Family History  Problem Relation Age of Onset  . Heart disease Father        Heart Disease before age 67  . Diabetes Father   . Hyperlipidemia Father   . Hypertension Father   .  Heart attack Father   . Deep vein thrombosis Father   . AAA (abdominal aortic aneurysm) Father   . Hypertension Mother   . Deep vein thrombosis Mother   . AAA (abdominal aortic aneurysm) Mother   . Cancer Sister        Ovarian  . Hypertension Sister   . Diabetes Paternal Grandmother   . Heart disease Paternal Uncle   . Diabetes Paternal Uncle   . Heart attack Paternal Grandfather 85       died of MI at 35  . Diabetes Paternal Aunt   . Diabetes Paternal Uncle   . Colon cancer Neg Hx    Allergies  Allergen Reactions  . Amoxicillin Shortness Of Breath and Rash  . Brilinta [Ticagrelor] Shortness Of Breath  . Erythromycin Shortness Of Breath, Other (See Comments) and Hives    Trouble swallowing  . Flagyl [Metronidazole] Shortness Of Breath and Palpitations  . Penicillins Hives, Shortness Of Breath, Rash and Other (See Comments)    Has patient had a PCN reaction causing immediate rash, facial/tongue/throat swelling, SOB or lightheadedness with hypotension: Yes Has patient had a PCN reaction causing severe rash involving mucus membranes or skin necrosis: No Has patient had a PCN reaction that required hospitalization: Yes Has patient had a PCN reaction occurring within the last 10 years: No If all of the above answers are "NO", then may proceed with Cephalosporin use.   . Isosorbide Mononitrate [Isosorbide Nitrate] Other (See Comments)    Joint aches, muscles hurt, difficult to walk   . Jardiance [Empagliflozin] Other (See Comments)    Nausea, joint aches, muscles aches  . Metformin And Related Other (See Comments)    Stomach pain, cold sweats, joint pain, burred vision, dizziness  . Tape Other (See Comments)    Skin pulls off with certain types Plastic tape causes skin to rip if left on for long periods of time  . Erythromycin Base Rash   Prior to Admission medications   Medication Sig Start Date End  Date Taking? Authorizing Provider  ALPRAZolam Duanne Moron) 0.5 MG tablet Take 1  tablet (0.5 mg total) by mouth 2 (two) times daily. 04/10/18  Yes Cherene Altes, MD  budesonide (PULMICORT) 0.25 MG/2ML nebulizer solution Take 2 mLs (0.25 mg total) by nebulization 2 (two) times daily. 04/10/18  Yes Cherene Altes, MD  buPROPion (WELLBUTRIN XL) 150 MG 24 hr tablet Take 150 mg by mouth daily.   Yes [provider]  DULoxetine (CYMBALTA) 30 MG capsule Take 1 capsule (30 mg total) by mouth daily. 04/11/18  Yes Cherene Altes, MD  ezetimibe (ZETIA) 10 MG tablet Take 1 tablet (10 mg total) by mouth at bedtime. 04/10/18  Yes Cherene Altes, MD  gabapentin (NEURONTIN) 300 MG capsule Take 2 capsules (600 mg total) by mouth 2 (two) times daily. 04/10/18  Yes Cherene Altes, MD  HYDROcodone-acetaminophen (NORCO/VICODIN) 5-325 MG tablet Take 1-2 tablets by mouth every 4 (four) hours as needed for severe pain. Patient taking differently: Take 1 tablet by mouth 2 (two) times daily as needed for severe pain.  05/04/18  Yes Nuala Alpha A, PA-C  insulin aspart (NOVOLOG) 100 UNIT/ML injection Inject 8 Units into the skin 3 (three) times daily with meals. 04/10/18  Yes Cherene Altes, MD  insulin glargine (LANTUS) 100 UNIT/ML injection Inject 0.35 mLs (35 Units total) into the skin at bedtime. 04/10/18  Yes Cherene Altes, MD  ipratropium-albuterol (DUONEB) 0.5-2.5 (3) MG/3ML SOLN Take 3 mLs by nebulization 2 (two) times daily. Patient taking differently: Take 3 mLs by nebulization 2 (two) times daily as needed (shortness of breath).  04/10/18  Yes Cherene Altes, MD  losartan (COZAAR) 25 MG tablet Take 0.5 tablets (12.5 mg total) by mouth daily. Patient taking differently: Take 12.5 mg by mouth at bedtime.  04/11/18  Yes Cherene Altes, MD  metoprolol succinate (TOPROL-XL) 50 MG 24 hr tablet Take 1 tablet (50 mg total) by mouth daily. Take with or immediately following a meal. 04/10/18  Yes Cherene Altes, MD  Multiple Vitamins-Minerals (HEALTHY EYES/LUTEIN) TABS  Take 1 tablet by mouth daily.   Yes [provider]  nitroGLYCERIN (NITROSTAT) 0.3 MG SL tablet Place 1 tablet (0.3 mg total) under the tongue every 5 (five) minutes x 3 doses as needed for chest pain. 09/15/17  Yes Georgiana Shore, NP  pantoprazole (PROTONIX) 40 MG tablet Take 1 tablet (40 mg total) by mouth daily. 04/11/18  Yes Cherene Altes, MD  rosuvastatin (CRESTOR) 40 MG tablet Take 1 tablet (40 mg total) by mouth at bedtime. 04/10/18  Yes Cherene Altes, MD  spironolactone (ALDACTONE) 25 MG tablet Take 1 tablet (25 mg total) by mouth daily. 04/11/18  Yes Cherene Altes, MD  torsemide (DEMADEX) 20 MG tablet Take 1 tablet (20 mg total) by mouth daily. 04/11/18  Yes Cherene Altes, MD  vitamin B-12 (CYANOCOBALAMIN) 1000 MCG tablet Take 1,000 mcg by mouth daily.   Yes [provider]  apixaban (ELIQUIS) 5 MG TABS tablet Take 1 tablet (5 mg total) by mouth 2 (two) times daily. 05/12/18   Larey Dresser, MD  clopidogrel (PLAVIX) 75 MG tablet Take 1 tablet (75 mg total) by mouth at bedtime. 05/12/18   Larey Dresser, MD  OXYGEN Inhale 2 L into the lungs at bedtime.     [provider]  predniSONE (DELTASONE) 10 MG tablet 40mg  po QD x7 days, then 30mg  QD x7 days, then 20mg  QD x7 days, then 10mg   QD x7 days, then stop Patient not taking: Reported on 05/12/2018 04/10/18   Cherene Altes, MD   No results found.  Positive ROS: All other systems have been reviewed and were otherwise negative with the exception of those mentioned in the HPI and as above.  Physical Exam: General: Alert, no acute distress Cardiovascular: No pedal edema Respiratory: No cyanosis, no use of accessory musculature GI: No organomegaly, abdomen is soft and non-tender Skin: No lesions in the area of chief complaint Neurologic: Sensation intact distally Psychiatric: Patient is competent for consent with normal mood and affect Lymphatic: No axillary or cervical lymphadenopathy    Assessment: Left ankle bimalleolar fracture with dislocation  Plan: - Our plan is for open reduction internal fixation of the left ankle.  We will place her in a short-leg splint postoperatively.  She will be admitted to the floor for routine postoperative care and physical therapy.  Due to her multiple medical comorbid disease and we will have the hospitalist follow along to ensure adequate management thereof.  - We once again discussed the risks, benefits, and indications for the above procedure.  She has provided informed consent to proceed.  We did solicit all questions which were answered to her satisfaction.  - The risks, benefits, and alternatives were discussed with the patient. There are risks associated with the surgery including, but not limited to, problems with anesthesia (death), infection, differences in leg length/angulation/rotation, fracture of bones, loosening or failure of implants, malunion, nonunion, hematoma (blood accumulation) which may require surgical drainage, blood clots, pulmonary embolism, nerve injury (foot drop), and blood vessel injury. The patient understands these risks and elects to proceed.     [name] Stairs, MD Cell [phone]    05/16/2018 6:54 AM

## 2018-05-16 NOTE — Op Note (Addendum)
Date of Surgery: 05/16/2018  INDICATIONS: Ms. Natt is a 73 y.o.-year-old female who sustained a left ankle fracture; she was indicated for open reduction and internal fixation due to the displaced nature of the articular fracture and came to the operating room today for this procedure. The patient did consent to the procedure after discussion of the risks and benefits.  PREOPERATIVE DIAGNOSIS: left ankle fracture  POSTOPERATIVE DIAGNOSIS: Same.  PROCEDURE:  1. Open treatment of left ankle fracture with internal fixation. Lateral malleolar CPT [id number] 2. Open treatment of left distal tibiofibular joint (syndesmosis).  SURGEON: Robyn Galati P. Stann Mainland, M.D.  ASSIST: Laure Kidney, RNFA.  ANESTHESIA:  Sedation , regional  TOURNIQUET TIME: 22 min on the calf with Eschmark  IV FLUIDS AND URINE: See anesthesia.  ESTIMATED BLOOD LOSS: 75 mL.  IMPLANTS: Arthrex Fibulock implant 3.8 mm x 130 mm Tightrope fixation device x 2  2.7 mm x 16 mm distal interlock screw x 2  COMPLICATIONS: None.  DESCRIPTION OF PROCEDURE: The patient was brought to the operating room and placed supine on the operating table.  The patient had been signed prior to the procedure and this was documented. The patient had the anesthesia placed by the anesthesiologist.   The prep verification and incision time-outs were performed to confirm that this was the correct patient, site, side and location. The patient had an SCD on the opposite lower extremity. The patient did receive antibiotics prior to the incision and was re-dosed during the procedure as needed at indicated intervals.  The patient had the lower extremity prepped and draped in the standard surgical fashion.  The extremity was exsanguinated using an esmarch bandage was used as a tourniquet at the upper calf.  Incision was made over the distal fibula and the fracture was exposed and reduced anatomically with a clamp. We then used the guide pin to establish the correct  start site on both AP and lateral views for the nail.  Once confirmed the opening reamer was passed across the fracture site.  Next the cannulated reamers were used to determine the need for a 3.8 mm nail at 130 mm.  After the nail was placed across the fracture and confirmed on both views we deployed the proximal locking mechanism and then placed two distal interlocking 2.7 mm screws through the jig.  The clamps were then removed and found to have stable fixation of the fibula.  The syndesmosis was stressed using live fluoroscopy and found to be unstable. The deltoid ligament was note found to be a block to reduction so we did not make a medial incision, given our concern over medial would complications.  We then proceeded with syndesmotic fixation with two tightrope devices through the nail and across the medial tibia.  These were placed in a similar fashion.  First drilling across 4 cortices, and then passing the device.  Care was taken to ensure that the ankle was in neutral dorsiflexion and the heel was floating for this portion of the procedure.  The fixation was very stable for the inferior one, but noted to pull through the bone on the second device.  This was not reinforced after the syndesmosis was stressed and found to have adequate stability with the single device.  The wounds were irrigated, and closed with vicryl with routine closure for the skin. The wounds were injected with local anesthetic. Sterile gauze was applied followed by a posterior splint. She was awakened and returned to the PACU in stable and satisfactory condition.  There were no complications.    POSTOPERATIVE PLAN: Ms. Payton will remain nonweightbearing on this leg for approximately 10 weeks; Ms. Goldwasser will return for suture removal in 2 weeks.  SHe will be immobilized in a short leg splint and then transitioned to a CAM walker at his first follow up appointment.  Ms. Toppins will receive DVT prophylaxis based on other  medications, activity level, and risk ratio of bleeding to thrombosis, and will resume her preoperative anticoagulation on POD #2.  Geralynn Rile, MD [facility] [phone] 9:45 AM

## 2018-05-16 NOTE — Anesthesia Procedure Notes (Signed)
Anesthesia Procedure Image    

## 2018-05-17 ENCOUNTER — Ambulatory Visit (HOSPITAL_COMMUNITY): Payer: Medicare Other

## 2018-05-17 LAB — GLUCOSE, CAPILLARY
Glucose-Capillary: 165 mg/dL — ABNORMAL HIGH (ref 70–99)
Glucose-Capillary: 246 mg/dL — ABNORMAL HIGH (ref 70–99)
Glucose-Capillary: 240 mg/dL — ABNORMAL HIGH (ref 70–99)
Glucose-Capillary: 208 mg/dL — ABNORMAL HIGH (ref 70–99)

## 2018-05-17 LAB — HEMOGLOBIN A1C
Hgb A1c MFr Bld: 6.9 % — ABNORMAL HIGH (ref 4.8–5.6)
Mean Plasma Glucose: 151 mg/dL

## 2018-05-17 MED ORDER — OXYCODONE HCL 5 MG PO TABS
10.0000 mg | ORAL_TABLET | ORAL | Status: DC | PRN
  Administered 2018-05-17 (×2): 10 mg via ORAL
  Filled 2018-05-17 (×2): qty 2

## 2018-05-17 MED ORDER — INSULIN GLARGINE 100 UNIT/ML ~~LOC~~ SOLN
17.0000 [IU] | Freq: Every day | SUBCUTANEOUS | Status: DC
  Administered 2018-05-17 – 2018-05-18 (×2): 17 [IU] via SUBCUTANEOUS
  Filled 2018-05-17 (×3): qty 0.17

## 2018-05-17 MED ORDER — OXYCODONE HCL 5 MG PO TABS: 5.0000 mg | ORAL_TABLET | ORAL | Status: DC | PRN

## 2018-05-17 MED ORDER — ACETAMINOPHEN 325 MG PO TABS: 650.0000 mg | ORAL_TABLET | Freq: Four times a day (QID) | ORAL | Status: DC | PRN

## 2018-05-17 MED ORDER — MORPHINE SULFATE (PF) 2 MG/ML IV SOLN: 2.0000 mg | INTRAVENOUS | Status: DC | PRN

## 2018-05-17 NOTE — Progress Notes (Signed)
Pt called at shift change requested to be put back on the bed. This RN and day shift RN assisted pt to the bed. Pt unable hold wight on her right leg as we are pivoting pt to the bed. Pt assisted down on to the floor very slowly. Called for more help. 2 more RNs helped pt back on to the bed. Pt denies pain. No injury noted.

## 2018-05-17 NOTE — Progress Notes (Addendum)
Inpatient Diabetes Program Recommendations  AACE/ADA: New Consensus Statement on Inpatient Glycemic Control (2015)  Target Ranges:  Prepandial:   less than 140 mg/dL      Peak postprandial:   less than 180 mg/dL (1-2 hours)      Critically ill patients:  140 - 180 mg/dL   Lab Results  Component Value Date   GLUCAP 246 (H) 05/17/2018   HGBA1C 6.9 (H) 05/16/2018     Results for [name], [name] [name] ([name] [id number]) as of 05/17/2018 15:29  Ref. Range 05/16/2018 09:54 05/16/2018 11:26 05/16/2018 16:18 05/16/2018 19:52 05/16/2018 22:51 05/17/2018 08:14 05/17/2018 12:15  Glucose-Capillary Latest Ref Range: 70 - 99 mg/dL 203 (H) 224 (H)  Novolog 5 units 399 (H)  Novolog 15 units 306 (H) 246 (H)  Novolog 2 units 165 (H)  Novolog 2 units 246 (H)  Novolog 5 units     Diabetes history: DM2  Outpatient Diabetes medications: Lantus 35 units QHS, Novolog 8 units TID with meals  Current orders for Inpatient glycemic control: Novolog moderate correction scale (0-15 units) TID with meals, Novolog 0-5 units QHS  Noted Decadron 5 mg received 0857 on 4/14.  CBG remain elevated and recommend restarting part of her home basal insulin.    MD please consider the following inpatient insulin adjustments:  Order for Lantus 17 units QHS (approximately 50% of outpatient dose).    Update - Spoke to Dr. Stann Mainland and will enter order.    Thank you.  -- Will follow during hospitalization.--  Jonna Clark RN, MSN Diabetes Coordinator Inpatient Glycemic Control Team Team Pager: [phone] (8am-5pm)

## 2018-05-17 NOTE — Evaluation (Signed)
Physical Therapy Evaluation Patient Details Name: [name] [name] [name]: [id number] DOB: [birth year] Today's Date: 05/17/2018   History of Present Illness  Pt is a 73 y.o. female recently d/c home from SNF, now admitted 05/16/18 after falling at home sustaining L ankle fx. S/p L ankle ORIF 4/14. PMH includes OA, strokes, CKD, PVD, DM2, CHF, anxiety.    Clinical Impression  Pt presents with an overall decrease in functional mobility secondary to above. PTA, pt recently d/c home from SNF and ambulating with RW. Educ on precautions, positioning, therex, and importance of mobility. Today, pt performed transfer training with RW, requiring up to [facility] for mobility. Pt would benefit from continued acute PT services to maximize functional mobility and independence prior to d/c with SNF-level therapies.     Follow Up Recommendations SNF;Supervision for mobility/OOB    Equipment Recommendations  (TBD)    Recommendations for Other Services       Precautions / Restrictions Precautions Precautions: Fall Restrictions Weight Bearing Restrictions: Yes LLE Weight Bearing: Non weight bearing      Mobility  Bed Mobility               General bed mobility comments: Received sitting in recliner  Transfers Overall transfer level: Needs assistance Equipment used: Rolling walker (2 wheeled) Transfers: Sit to/from Stand Sit to Stand: Mod assist         General transfer comment: Heavy reliance on BUEs to push into standing, modA to assist trunk elevation when pt transitions UE from chair to RW; modA to steady upon standing  Ambulation/Gait Ambulation/Gait assistance: Mod assist Gait Distance (Feet): 1 Feet Assistive device: Rolling walker (2 wheeled)       General Gait Details: Pt unable to completely clear RLE to hop. Able to scoot minimally on R foot with RW and modA to maintain balance  Stairs            Wheelchair Mobility    Modified Rankin (Stroke Patients Only)        Balance Overall balance assessment: Needs assistance   Sitting balance-Leahy Scale: Fair       Standing balance-Leahy Scale: Poor Standing balance comment: Reliant on BUE support and min guard to modA to maintain standing balance; required modA on third trial of standing with increased fatigue                             Pertinent Vitals/Pain Pain Assessment: Faces Faces Pain Scale: Hurts little more Pain Location: LLE Pain Descriptors / Indicators: Discomfort;Guarding Pain Intervention(s): Limited activity within patient's tolerance;Monitored during session    Home Living Family/patient expects to be discharged to:: Skilled nursing facility Living Arrangements: Spouse/significant other               Additional Comments: Plans to d/c to [facility] SNF    Prior Function Level of Independence: Needs assistance   Gait / Transfers Assistance Needed: Recent d/c home from [facility]. Ambulating with RW, husband present for supervision and assist as needed  ADL's / Homemaking Assistance Needed: Assist from husband for ADLs        Hand Dominance        Extremity/Trunk Assessment   Upper Extremity Assessment Upper Extremity Assessment: Generalized weakness    Lower Extremity Assessment Lower Extremity Assessment: LLE deficits/detail LLE Deficits / Details: s/p L ankle ORIF; knee flex/ext at least 3/5 LLE: Unable to fully assess due to immobilization;Unable to fully assess due  to pain       Communication   Communication: No difficulties  Cognition Arousal/Alertness: Awake/alert Behavior During Therapy: WFL for tasks assessed/performed Overall Cognitive Status: Within Functional Limits for tasks assessed                                        General Comments General comments (skin integrity, edema, etc.): DOE 3/4 after standing trials as pt holding breath, cues for deep breathing; SpO2 95% on RA    Exercises  General Exercises - Lower Extremity Long Arc Quad: AROM;Both;Seated Hip Flexion/Marching: AROM;Both;Seated   Assessment/Plan    PT Assessment Patient needs continued PT services  PT Problem List Decreased strength;Decreased range of motion;Decreased activity tolerance;Decreased balance;Decreased mobility;Decreased knowledge of use of DME;Decreased knowledge of precautions;Pain       PT Treatment Interventions DME instruction;Gait training;Functional mobility training;Therapeutic activities;Therapeutic exercise;Balance training;Patient/family education    PT Goals (Current goals can be found in the Care Plan section)  Acute Rehab PT Goals Patient Stated Goal: Rehab at [facility] before return home PT Goal Formulation: With patient Time For Goal Achievement: 05/31/18 Potential to Achieve Goals: Good    Frequency Min 3X/week   Barriers to discharge        Co-evaluation               AM-PAC PT "6 Clicks" Mobility  Outcome Measure Help needed turning from your back to your side while in a flat bed without using bedrails?: A Little Help needed moving from lying on your back to sitting on the side of a flat bed without using bedrails?: A Little Help needed moving to and from a bed to a chair (including a wheelchair)?: A Lot Help needed standing up from a chair using your arms (e.g., wheelchair or bedside chair)?: A Lot Help needed to walk in hospital room?: A Lot Help needed climbing 3-5 steps with a railing? : Total 6 Click Score: 13    End of Session   Activity Tolerance: Patient tolerated treatment well;Patient limited by fatigue Patient left: in chair;with call bell/phone within reach Nurse Communication: Mobility status PT Visit Diagnosis: Other abnormalities of gait and mobility (R26.89);Pain Pain - Right/Left: Left Pain - part of body: Ankle and joints of foot    Time: 2458-0998 PT Time Calculation (min) (ACUTE ONLY): 20 min   Charges:   PT  Evaluation $PT Eval Moderate Complexity: [place], PT, DPT Acute Rehabilitation Services  Pager [phone] Office [place] 05/17/2018, 5:22 PM

## 2018-05-17 NOTE — TOC Initial Note (Signed)
Transition of Care [facility]) - Initial/Assessment Note    Patient Details  Name: [name] [name] [name]: [id number] Date of Birth: [birth year]  Transition of Care [facility]) CM/SW Contact:    Alexander Mt, [place] Phone Number: 05/17/2018, 10:25 AM  Clinical Narrative:                 Spoke with pt at bedside, introduced self, role, reason for visit. Pt pleasant, had recently discharged from SNF and as she went home from discharge she fell in her driveway and fractured her ankle. Despite accident pt in good spirits. She has adult children and a supportive spouse. [facility] bed for pt and her belongings whenever medically stable for d/c.  Following for therapy recommendations.   Expected Discharge Plan: Skilled Nursing Facility Barriers to Discharge: Continued Medical Work up   Patient Goals and CMS Choice   CMS Medicare.gov Compare Post Acute Care list provided to:: Patient Choice offered to / list presented to : Patient  Expected Discharge Plan and Services Expected Discharge Plan: [place] In-house Referral: Clinical Social Work Discharge Planning Services: NA Post Acute Care Choice: [place] Living arrangements for the past 2 months: [place], [place]   Prior Living Arrangements/Services Living arrangements for the past 2 months: [place], [place] Lives with:: Spouse   Do you feel safe going back to the place where you live?: Yes         Activities of Daily Living Home Assistive Devices/Equipment: Eyeglasses, Wheelchair ADL Screening (condition at time of admission) Patient's cognitive ability adequate to safely complete daily activities?: Yes Is the patient deaf or have difficulty hearing?: No Does the patient have difficulty seeing, even when wearing glasses/contacts?: Yes Does the patient have difficulty concentrating, remembering, or making decisions?: No Patient able to express  need for assistance with ADLs?: Yes Does the patient have difficulty dressing or bathing?: Yes Independently performs ADLs?: No Communication: Independent Dressing (OT): Needs assistance Is this a change from baseline?: Pre-admission baseline Grooming: Independent Feeding: Independent Bathing: Needs assistance Is this a change from baseline?: Pre-admission baseline Toileting: Needs assistance Is this a change from baseline?: Pre-admission baseline In/Out Bed: Needs assistance Is this a change from baseline?: Pre-admission baseline Walks in Home: Needs assistance Is this a change from baseline?: Pre-admission baseline Does the patient have difficulty walking or climbing stairs?: Yes Weakness of Legs: Both Weakness of Arms/Hands: None  Permission Sought/Granted Permission sought to share information with : Facility Sport and exercise psychologist, Family Supports Permission granted to share information with : Yes, Verbal Permission Granted              Emotional Assessment              Admission diagnosis:  left ankle bimalleolar fracture Patient Active Problem List   Diagnosis Date Noted  . Ankle fracture, left 05/16/2018  . Amiodarone pulmonary toxicity   . Physical deconditioning   . 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 Acute on chronic congestive heart failure ([place])   . Acute on chronic combined systolic and diastolic CHF (congestive heart failure) ([place]) 01/16/2018  . Acute on chronic diastolic CHF (congestive heart failure) ([place]) 01/16/2018  . Acute on chronic diastolic (congestive) heart failure ([place]) 01/16/2018  . Atrial fibrillation ([place]) 10/21/2017  . CAD (coronary artery disease) of bypass graft 10/20/2017  . Coronary artery disease of bypass graft of native heart with stable angina pectoris ([place])   . Chronic low back  pain 08/24/2016  . Pain of left thumb 08/05/2016  . Nocturnal hypoxia 06/02/2016  . Hoarseness 04/05/2016  . Laryngopharyngeal reflux (LPR) 04/05/2016  .  Pharyngoesophageal dysphagia 04/05/2016  . Angina decubitus ([place]) 05/22/2015  . Chronic insomnia 05/06/2015  . Common migraine 05/14/2014  . Abnormality of gait 05/14/2014  . Memory change 05/14/2014  . Aneurysm, cerebral, nonruptured 05/14/2014  . Cramp of limb-Left neck 05/10/2014  . Hematemesis with nausea   . Vomiting blood   . Dizziness and giddiness 09/21/2013  . Atypical chest pain 07/18/2013  . Unstable angina ([place]) 04/09/2013  . Carotid artery stenosis, symptomatic 03/20/2013  . Chronic diastolic CHF (congestive heart failure) ([place]) 09/23/2012  . Cerebrovascular disease 07/10/2012  . Cerebral artery occlusion with cerebral infarction ([place]) 07/10/2012  . Mitral regurgitation 04/15/2012  . TIA (transient ischemic attack) 04/15/2012  . Occlusion and stenosis of carotid artery without mention of cerebral infarction 08/18/2011  . Bariatric surgery status 06/17/2011  . Speech abnormality 03/22/2011  . Dyspnea 02/24/2011  . PAPILLARY MUSCLE DYSFUNCTION, NON-RHEUMATIC 10/09/2008  . UNSPECIFIED VITAMIN D DEFICIENCY 10/24/2007  . MYOCARDIAL INFARCTION, HX OF 10/24/2007  . PERSISTENT VOMITING 10/24/2007  . OSTEOARTHRITIS 10/24/2007  . MIGRAINES, HX OF 10/24/2007  . DM 01/16/2007  . Hyperlipemia 01/16/2007  . Obesity-post failed open gastroplasty 1984  01/16/2007  . OBSTRUCTIVE SLEEP APNEA 01/16/2007  . Essential hypertension 01/16/2007  . Coronary atherosclerosis 01/16/2007  . Asthma 01/16/2007  . GERD 01/16/2007  . VENTRAL HERNIA 01/16/2007   PCP:  Derinda Late, MD Pharmacy:   [facility], [place] - [address] [place] [address] Phone: [phone] Fax: [phone]     Social Determinants of Health (SDOH) Interventions    Readmission Risk Interventions No flowsheet data found.

## 2018-05-17 NOTE — Progress Notes (Signed)
   Subjective:  Patient reports pain as moderate.  Increased pain after block wore off.  Denies SOB/CP/N/V, NS/[place],.  Objective:   VITALS:   Vitals:   05/17/18 0521 05/17/18 0858 05/17/18 1011 05/17/18 1308  BP: (!) 135/58  114/61 133/85  Pulse: (!) 58  60 (!) 59  Resp: 18  18 19   Temp: 97.7 F (36.5 C)  98 F (36.7 C) (!) 97.3 F (36.3 C)  TempSrc: Oral  Oral Oral  SpO2: 100% 97% 93% 95%  Weight:      Height:        Neurologically intact Sensation intact distally Intact pulses distally Dorsiflexion/Plantar flexion intact Incision: dressing C/D/I Compartment soft   Lab Results  Component Value Date   WBC 12.2 (H) 05/16/2018   HGB 12.9 05/16/2018   HCT 39.0 05/16/2018   MCV 97.5 05/16/2018   PLT 197 05/16/2018   BMET    Component Value Date/Time   NA 141 05/16/2018 0623   K 3.0 (L) 05/16/2018 0623   CL 99 05/16/2018 0623   CO2 28 05/16/2018 0623   GLUCOSE 205 (H) 05/16/2018 0623   BUN 19 05/16/2018 0623   CREATININE 1.98 (H) 05/16/2018 0623   CREATININE 1.07 (H) 05/28/2015 1230   CALCIUM 9.7 05/16/2018 0623   GFRNONAA 25 (L) 05/16/2018 0623   GFRAA 29 (L) 05/16/2018 5643     Assessment/Plan: 1 Day Post-Op   Active Problems:   Ankle fracture, left   Advance diet -NWB LLE, strict elevation - home meds restarted - SCD and home eliquis and plavix for DVT ppx - will adjust pain meds - anticipate dc to SNF tomorrow vs. Friday.   Nicholes Stairs 05/17/2018, 1:45 PM   Geralynn Rile, MD [phone]

## 2018-05-18 ENCOUNTER — Inpatient Hospital Stay (HOSPITAL_COMMUNITY): Payer: Medicare Other

## 2018-05-18 DIAGNOSIS — E1122 Type 2 diabetes mellitus with diabetic chronic kidney disease: Secondary | ICD-10-CM

## 2018-05-18 DIAGNOSIS — I10 Essential (primary) hypertension: Secondary | ICD-10-CM

## 2018-05-18 DIAGNOSIS — I5022 Chronic systolic (congestive) heart failure: Secondary | ICD-10-CM

## 2018-05-18 DIAGNOSIS — R531 Weakness: Secondary | ICD-10-CM

## 2018-05-18 DIAGNOSIS — E785 Hyperlipidemia, unspecified: Secondary | ICD-10-CM

## 2018-05-18 DIAGNOSIS — Z794 Long term (current) use of insulin: Secondary | ICD-10-CM

## 2018-05-18 DIAGNOSIS — N183 Chronic kidney disease, stage 3 (moderate): Secondary | ICD-10-CM

## 2018-05-18 LAB — CBC
HCT: 31.8 % — ABNORMAL LOW (ref 36.0–46.0)
Hemoglobin: 10.7 g/dL — ABNORMAL LOW (ref 12.0–15.0)
MCH: 32.7 pg (ref 26.0–34.0)
MCHC: 33.6 g/dL (ref 30.0–36.0)
MCV: 97.2 fL (ref 80.0–100.0)
Platelets: 175 10*3/uL (ref 150–400)
RBC: 3.27 MIL/uL — ABNORMAL LOW (ref 3.87–5.11)
RDW: 15.3 % (ref 11.5–15.5)
WBC: 13.2 10*3/uL — ABNORMAL HIGH (ref 4.0–10.5)
nRBC: 0 % (ref 0.0–0.2)

## 2018-05-18 LAB — COMPREHENSIVE METABOLIC PANEL
ALT: 22 U/L (ref 0–44)
AST: 21 U/L (ref 15–41)
Albumin: 2.9 g/dL — ABNORMAL LOW (ref 3.5–5.0)
Alkaline Phosphatase: 79 U/L (ref 38–126)
Anion gap: 11 (ref 5–15)
BUN: 18 mg/dL (ref 8–23)
CO2: 29 mmol/L (ref 22–32)
Calcium: 9.1 mg/dL (ref 8.9–10.3)
Chloride: 96 mmol/L — ABNORMAL LOW (ref 98–111)
Creatinine, Ser: 1.95 mg/dL — ABNORMAL HIGH (ref 0.44–1.00)
GFR calc Af Amer: 29 mL/min — ABNORMAL LOW (ref >60)
GFR calc non Af Amer: 25 mL/min — ABNORMAL LOW (ref >60)
Glucose, Bld: 203 mg/dL — ABNORMAL HIGH (ref 70–99)
Potassium: 4 mmol/L (ref 3.5–5.1)
Sodium: 136 mmol/L (ref 135–145)
Total Bilirubin: 1.3 mg/dL — ABNORMAL HIGH (ref 0.3–1.2)
Total Protein: 5.8 g/dL — ABNORMAL LOW (ref 6.5–8.1)

## 2018-05-18 LAB — GLUCOSE, CAPILLARY
Glucose-Capillary: 138 mg/dL — ABNORMAL HIGH (ref 70–99)
Glucose-Capillary: 185 mg/dL — ABNORMAL HIGH (ref 70–99)
Glucose-Capillary: 205 mg/dL — ABNORMAL HIGH (ref 70–99)
Glucose-Capillary: 175 mg/dL — ABNORMAL HIGH (ref 70–99)

## 2018-05-18 NOTE — NC FL2 (Signed)
[place] LEVEL OF CARE SCREENING TOOL     IDENTIFICATION  Patient Name: [name] [name] Birthdate: 1945-07-09 Sex: female Admission Date (Current Location): 05/16/2018  [facility] and [place] Number:  Herbalist and Address:  [facility] [place]. [facility], [place] [address] [place], [place] [address]      Provider Number: [id number]  Attending Physician Name and Address:  Nicholes Stairs, MD  Relative Name and Phone Number:  Sharie Amorin; husband; [phone]    Current Level of Care: Hospital Recommended Level of Care: [place] Prior Approval Number:    Date Approved/Denied:   PASRR Number: [id number]  Discharge Plan: SNF    Current Diagnoses: Patient Active Problem List   Diagnosis Date Noted  . Ankle fracture, left 05/16/2018  . Amiodarone pulmonary toxicity   . Physical deconditioning   . 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 Acute on chronic congestive heart failure ([place])   . Acute on chronic combined systolic and diastolic CHF (congestive heart failure) ([place]) 01/16/2018  . Acute on chronic diastolic CHF (congestive heart failure) ([place]) 01/16/2018  . Acute on chronic diastolic (congestive) heart failure ([place]) 01/16/2018  . Atrial fibrillation ([place]) 10/21/2017  . CAD (coronary artery disease) of bypass graft 10/20/2017  . Coronary artery disease of bypass graft of native heart with stable angina pectoris ([place])   . Chronic low back pain 08/24/2016  . Pain of left thumb 08/05/2016  . Nocturnal hypoxia 06/02/2016  . Hoarseness 04/05/2016  . Laryngopharyngeal reflux (LPR) 04/05/2016  . Pharyngoesophageal dysphagia 04/05/2016  . Angina decubitus ([place]) 05/22/2015  . Chronic insomnia 05/06/2015  . Common migraine 05/14/2014  . Abnormality of gait 05/14/2014  . Memory change 05/14/2014  . Aneurysm, cerebral, nonruptured 05/14/2014  . Cramp of limb-Left neck 05/10/2014  . Hematemesis with nausea   . Vomiting blood   .  Dizziness and giddiness 09/21/2013  . Atypical chest pain 07/18/2013  . Unstable angina ([place]) 04/09/2013  . Carotid artery stenosis, symptomatic 03/20/2013  . Chronic diastolic CHF (congestive heart failure) ([place]) 09/23/2012  . Cerebrovascular disease 07/10/2012  . Cerebral artery occlusion with cerebral infarction ([place]) 07/10/2012  . Mitral regurgitation 04/15/2012  . TIA (transient ischemic attack) 04/15/2012  . Occlusion and stenosis of carotid artery without mention of cerebral infarction 08/18/2011  . Bariatric surgery status 06/17/2011  . Speech abnormality 03/22/2011  . Dyspnea 02/24/2011  . PAPILLARY MUSCLE DYSFUNCTION, NON-RHEUMATIC 10/09/2008  . UNSPECIFIED VITAMIN D DEFICIENCY 10/24/2007  . MYOCARDIAL INFARCTION, HX OF 10/24/2007  . PERSISTENT VOMITING 10/24/2007  . OSTEOARTHRITIS 10/24/2007  . MIGRAINES, HX OF 10/24/2007  . DM 01/16/2007  . Hyperlipemia 01/16/2007  . Obesity-post failed open gastroplasty 1984  01/16/2007  . OBSTRUCTIVE SLEEP APNEA 01/16/2007  . Essential hypertension 01/16/2007  . Coronary atherosclerosis 01/16/2007  . Asthma 01/16/2007  . GERD 01/16/2007  . VENTRAL HERNIA 01/16/2007    Orientation RESPIRATION BLADDER Height & Weight     Self, Time, Situation, Place  Normal Continent Weight: 252 lb (114.3 kg) Height:  5' 2.5" (158.8 cm)  BEHAVIORAL SYMPTOMS/MOOD NEUROLOGICAL BOWEL NUTRITION STATUS      Continent Diet(carb modified; thin liquids)  AMBULATORY STATUS COMMUNICATION OF NEEDS Skin   Extensive Assist Verbally Surgical wounds(surgical incision on left ankle with compression wrap)                       Personal Care Assistance Level of Assistance  Bathing, Feeding, Dressing Bathing Assistance: Maximum assistance  Feeding assistance: Independent Dressing Assistance: Maximum assistance     Functional Limitations Info  Sight, Hearing, Speech Sight Info: Adequate Hearing Info: Adequate Speech Info: Adequate    SPECIAL CARE  FACTORS FREQUENCY  OT (By licensed OT), PT (By licensed PT)     PT Frequency: 5x week OT Frequency: 5x week            Contractures Contractures Info: Not present    Additional Factors Info  Code Status, Allergies, Psychotropic, Insulin Sliding Scale Code Status Info: Full Code Allergies Info: AMOXICILLIN, BRILINTA TICAGRELOR, ERYTHROMYCIN, FLAGYL METRONIDAZOLE, PENICILLINS, ISOSORBIDE MONONITRATE ISOSORBIDE NITRATE, JARDIANCE EMPAGLIFLOZIN, METFORMIN AND RELATED, TAPE, ERYTHROMYCIN BASE  Psychotropic Info: ALPRAZolam (XANAX) tablet 0.5 mg 2x daily PO; buPROPion (WELLBUTRIN XL) 24 hr tablet 150 mg daily PO; DULoxetine (CYMBALTA) DR capsule 30 mg daily PO;  Insulin Sliding Scale Info: insulin aspart (novoLOG) injection 0-15 Units 3x daily with meals; insulin aspart (novoLOG) injection 0-5 Units daily at bedtime       Current Medications (05/18/2018):  This is the current hospital active medication list Current Facility-Administered Medications  Medication Dose Route Frequency Provider Last Rate Last Dose  . acetaminophen (TYLENOL) tablet 650 mg  650 mg Oral Q6H PRN Nicholes Stairs, MD      . ALPRAZolam Duanne Moron) tablet 0.5 mg  0.5 mg Oral BID Nicholes Stairs, MD   0.5 mg at 05/17/18 2140  . apixaban (ELIQUIS) tablet 5 mg  5 mg Oral BID Nicholes Stairs, MD   5 mg at 05/17/18 2140  . budesonide (PULMICORT) nebulizer solution 0.25 mg  0.25 mg Nebulization BID Nicholes Stairs, MD   0.25 mg at 05/17/18 2108  . buPROPion (WELLBUTRIN XL) 24 hr tablet 150 mg  150 mg Oral Daily Nicholes Stairs, MD   150 mg at 05/17/18 0940  . clopidogrel (PLAVIX) tablet 75 mg  75 mg Oral QHS Nicholes Stairs, MD   75 mg at 05/17/18 2140  . docusate sodium (COLACE) capsule 100 mg  100 mg Oral BID Nicholes Stairs, MD   100 mg at 05/17/18 2140  . DULoxetine (CYMBALTA) DR capsule 30 mg  30 mg Oral Daily Nicholes Stairs, MD   30 mg at 05/17/18 0940  . ezetimibe (ZETIA)  tablet 10 mg  10 mg Oral QHS Nicholes Stairs, MD   10 mg at 05/17/18 2140  . gabapentin (NEURONTIN) capsule 600 mg  600 mg Oral BID Nicholes Stairs, MD   600 mg at 05/17/18 2140  . insulin aspart (novoLOG) injection 0-15 Units  0-15 Units Subcutaneous TID WC Nicholes Stairs, MD   5 Units at 05/17/18 1729  . insulin aspart (novoLOG) injection 0-5 Units  0-5 Units Subcutaneous QHS Nicholes Stairs, MD   2 Units at 05/17/18 2141  . insulin glargine (LANTUS) injection 17 Units  17 Units Subcutaneous QHS Nicholes Stairs, MD   17 Units at 05/17/18 2140  . losartan (COZAAR) tablet 12.5 mg  12.5 mg Oral Daily Nicholes Stairs, MD   12.5 mg at 05/17/18 0941  . metoCLOPramide (REGLAN) tablet 5-10 mg  5-10 mg Oral Q8H PRN Nicholes Stairs, MD       Or  . metoCLOPramide [facility]) injection 5-10 mg  5-10 mg Intravenous Q8H PRN Nicholes Stairs, MD      . metoprolol succinate (TOPROL-XL) 24 hr tablet 50 mg  50 mg Oral QPC breakfast Nicholes Stairs, MD   50 mg at 05/17/18 0855  .  morphine 2 MG/ML injection 2 mg  2 mg Intravenous Q2H PRN Nicholes Stairs, MD      . nitroGLYCERIN (NITROSTAT) SL tablet 0.4 mg  0.4 mg Sublingual Q5 min PRN Nicholes Stairs, MD      . ondansetron [facility]) tablet 4 mg  4 mg Oral Q6H PRN Nicholes Stairs, MD       Or  . ondansetron [facility]) injection 4 mg  4 mg Intravenous Q6H PRN Nicholes Stairs, MD      . oxyCODONE (Oxy IR/ROXICODONE) immediate release tablet 10 mg  10 mg Oral Q3H PRN Nicholes Stairs, MD   10 mg at 05/17/18 2141  . oxyCODONE (Oxy IR/ROXICODONE) immediate release tablet 5 mg  5 mg Oral Q4H PRN Nicholes Stairs, MD      . pantoprazole (PROTONIX) EC tablet 40 mg  40 mg Oral Daily Nicholes Stairs, MD   40 mg at 05/17/18 0941  . potassium chloride SA (K-DUR,KLOR-CON) CR tablet 20 mEq  20 mEq Oral BID Nicholes Stairs, MD   20 mEq at 05/17/18 2139  . rosuvastatin (CRESTOR) tablet 40  mg  40 mg Oral QHS Nicholes Stairs, MD   40 mg at 05/17/18 2139  . spironolactone (ALDACTONE) tablet 25 mg  25 mg Oral Daily Nicholes Stairs, MD   25 mg at 05/17/18 0940  . torsemide (DEMADEX) tablet 20 mg  20 mg Oral Daily Nicholes Stairs, MD   20 mg at 05/17/18 0940  . traMADol (ULTRAM) tablet 50 mg  50 mg Oral Q6H Nicholes Stairs, MD   50 mg at 05/18/18 0555  . vitamin B-12 (CYANOCOBALAMIN) tablet 1,000 mcg  1,000 mcg Oral Daily Nicholes Stairs, MD   1,000 mcg at 05/17/18 0940     Discharge Medications: Please see discharge summary for a list of discharge medications.  Relevant Imaging Results:  Relevant Lab Results:   Additional Information SSN: [id number]  Alexander Mt, [place]

## 2018-05-18 NOTE — Progress Notes (Signed)
Physical Therapy Treatment Patient Details Name: [name] [name] [name]: [id number] DOB: 1945/03/04 Today's Date: 05/18/2018    History of Present Illness Pt is a 73 y.o. female recently d/c home from SNF, now admitted 05/16/18 after falling at home sustaining L ankle fx. S/p L ankle ORIF 4/14. PMH includes OA, strokes, CKD, PVD, DM2, CHF, anxiety.    PT Comments    Pt performed sit to stand, squat pivot and LE/UE exercises from seated position.  Pt appears more fatigued and required increased assistance.  Pt continues to benefit from skilled rehab in post acute rehab.  Plan next session for continued progression of standing tolerance.     Follow Up Recommendations  SNF;Supervision for mobility/OOB     Equipment Recommendations  (TBD)    Recommendations for Other Services       Precautions / Restrictions Precautions Precautions: Fall Restrictions Weight Bearing Restrictions: Yes LLE Weight Bearing: Non weight bearing    Mobility  Bed Mobility Overal bed mobility: Needs Assistance Bed Mobility: Supine to Sit     Supine to sit: Mod assist;+2 for physical assistance     General bed mobility comments: Pt required assistance to advance LEs to edge of bed and to elevate trunk into sitting.  Pt fatigued and presents with minor posterior lean but able correct once hips advanced to edge of bed.,  Transfers Overall transfer level: Needs assistance Equipment used: Rolling walker (2 wheeled) Transfers: Sit to/from [employer] Sit to Stand: Mod assist;+2 physical assistance   Squat pivot transfers: Mod assist(required decreased assistance to move from bed to recliner chair )     General transfer comment: Heavy reliance on UE support, unable to keep LLE NWB and presents with LLE resting on floor.  Pt able to stand 5 sec before returning to seated position.  Opted for squat pivot from bed to drop arm recliner.    Ambulation/Gait Ambulation/Gait assistance: (NT unable  to stand for progression to gait training.  )               Stairs             Wheelchair Mobility    Modified Rankin (Stroke Patients Only)       Balance Overall balance assessment: Needs assistance   Sitting balance-Leahy Scale: Poor       Standing balance-Leahy Scale: Poor Standing balance comment: Unable to stand upright and maintain weight bearing heavy reliance on UE support.  Only able to stand 5 sec before sitting.                              Cognition Arousal/Alertness: Awake/alert Behavior During Therapy: WFL for tasks assessed/performed Overall Cognitive Status: Within Functional Limits for tasks assessed                                        Exercises General Exercises - Lower Extremity Quad Sets: AROM;10 reps;Both;Seated Long Arc Quad: AROM;10 reps;Seated;Both Hip Flexion/Marching: AROM;Both;Seated;10 reps Other Exercises Other Exercises: B Shoulder flexion 1x10 reps in seated position.      General Comments        Pertinent Vitals/Pain Pain Assessment: Faces Faces Pain Scale: Hurts little more Pain Location: LLE Pain Descriptors / Indicators: Discomfort;Guarding Pain Intervention(s): Monitored during session;Repositioned    Home Living  Prior Function            PT Goals (current goals can now be found in the care plan section) Acute Rehab PT Goals Patient Stated Goal: [facility] at [facility] before return home Potential to Achieve Goals: Good Progress towards PT goals: Progressing toward goals    Frequency    Min 3X/week      PT Plan Current plan remains appropriate    Co-evaluation              AM-PAC PT "6 Clicks" Mobility   Outcome Measure  Help needed turning from your back to your side while in a flat bed without using bedrails?: A Lot Help needed moving from lying on your back to sitting on the side of a flat bed without using bedrails?:  A Lot Help needed moving to and from a bed to a chair (including a wheelchair)?: A Lot Help needed standing up from a chair using your arms (e.g., wheelchair or bedside chair)?: A Lot Help needed to walk in hospital room?: A Lot Help needed climbing 3-5 steps with a railing? : Total 6 Click Score: 11    End of Session Equipment Utilized During Treatment: Gait belt Activity Tolerance: Patient tolerated treatment well;Patient limited by fatigue Patient left: in chair;with call bell/phone within reach;with chair alarm set Nurse Communication: Mobility status;Need for lift equipment(lift pad placed under patient) PT Visit Diagnosis: Other abnormalities of gait and mobility (R26.89);Pain Pain - Right/Left: Left Pain - part of body: Ankle and joints of foot     Time: 6295-2841 PT Time Calculation (min) (ACUTE ONLY): 21 min  Charges:  $Therapeutic Activity: 8-22 mins                     Governor Rooks, PTA Acute Rehabilitation Services Pager [phone] Office [phone]     [name] [name] Eli Hose 05/18/2018, 3:15 PM

## 2018-05-18 NOTE — Progress Notes (Signed)
   Subjective:  Patient reports pain as moderate.  Pain better controlled today.  States she feels "off" and a little dizzy.  But denies SOB/CP or N/V.  toelrating PO.  Objective:   VITALS:   Vitals:   05/17/18 2100 05/17/18 2109 05/18/18 0424 05/18/18 0830  BP: (!) 137/49  134/68   Pulse: (!) 59  60   Resp: 18  19   Temp: 98.4 F (36.9 C)  98.1 F (36.7 C)   TempSrc: Oral  Oral   SpO2: 95% 95% 96% 98%  Weight:      Height:        Neurovascular intact Sensation intact distally Intact pulses distally Dorsiflexion/Plantar flexion intact Incision: dressing C/D/I Compartment soft   Lab Results  Component Value Date   WBC 12.2 (H) 05/16/2018   HGB 12.9 05/16/2018   HCT 39.0 05/16/2018   MCV 97.5 05/16/2018   PLT 197 05/16/2018   BMET    Component Value Date/Time   NA 141 05/16/2018 0623   K 3.0 (L) 05/16/2018 0623   CL 99 05/16/2018 0623   CO2 28 05/16/2018 0623   GLUCOSE 205 (H) 05/16/2018 0623   BUN 19 05/16/2018 0623   CREATININE 1.98 (H) 05/16/2018 0623   CREATININE 1.07 (H) 05/28/2015 1230   CALCIUM 9.7 05/16/2018 0623   GFRNONAA 25 (L) 05/16/2018 0623   GFRAA 29 (L) 05/16/2018 2863     Assessment/Plan: 2 Days Post-Op   Active Problems:   Ankle fracture, left   - continue NWB LLE with PT  - elevate  - will check screening cbc for any anemia that may explain dizzy feeling.  Also have consulted Hospitalist service given her mutliple morbidities and polypharmacy.  Thank you for their assistance  - continue SSi and carb mod diet for FEN, no iv fluids at this time  - pending medical recs hopefully to SNF tomorrow   Nicholes Stairs 05/18/2018, 1:45 PM   Geralynn Rile, MD [phone]

## 2018-05-18 NOTE — Consult Note (Signed)
[facility] Medical Consultation  [name] [name] IWP:[id number] DOB: 11-06-1945 DOA: 05/16/2018 PCP: Derinda Late, MD   Requesting physician: Dr. Stann Mainland Date of consultation: 05/18/2018 Reason for consultation: Weakness, dizziness, multiple medical problems  HPI:  This is a pleasant 73 year old female with history of a recent complex hospitalization in March 5053, acute systolic CHF with EF of 97-67%, diagnosed in March 2020 felt to be Takotsubo versus ischemic cardiomyopathy, to have further follow-up as an outpatient, chronic kidney disease stage III with baseline creatinine around 1.7, an episode of thrombocytopenia suspected to be ITP with hematology consultation status post IVIG with resolution of her low platelets, hospital course was also complicated by pulmonary edema as well as pneumonia with ventilator dependent respiratory failure, also with history of type 2 diabetes mellitus, coronary artery disease with a cath 9/19 with severe native CAD and LIMA patent, sequential SVG-PLOM/OM1 with 80% proximal stenosis, s/p DES, SVG to RCA chronically occluded, paroxysmal A. fib used to be on amiodarone but this is on hold due to fear of lung toxicity, hypertension who we are asked to consult for weakness. She was admitted to the orthopedic surgery service on 4/14 after having a ground-level fall the week prior and came to orthopedics office for an unstable left ankle fracture.  Patient today tells me that she has been feeling weak upon standing for several months now and does not feel like she has a lot of new symptoms currently but perhaps slightly worse in the last day.  She states that that is the main reason why she fell and had the ankle fracture to begin with.  She reports that when she gets up she feels like her legs are very weak, dizzy at times and lightheaded.  She currently denies any chest pain, denies any palpitations.  She denies any shortness of breath.  She denies abdominal  pain, nausea or vomiting or diarrhea.  She states that she has fairly decent p.o. intake.  Discussed with primary team today Dr. Stann Mainland who felt like patient is weaker than normal as well as appears more pale.  We are asked to see in consultation for her generalized weakness as well as management of multiple medical problems  Review of Systems:  As per HPI, otherwise 10 point review of systems negative   Impression/Recommendations Active Problems:   Ankle fracture, left   Generalized weakness -This seems to have a chronic component to it per patient, this is what caused her to fall and having her blood pressure to begin with.  She feels slightly weaker today, will obtain a CBC and CMP to evaluate renal function as well as hemoglobin for anemia. -Suspect there is a degree of deconditioning as she had a complex hospitalization in March and then she has been SNF since  Chronic systolic CHF /Takotsubo versus ischemic cardiomyopathy -Evaluated by cardiology last time she was hospitalized, she currently appears euvolemic, no peripheral edema, no crackles on exam.  Obtain a chest x-ray for completeness given weakness.  She is on torsemide and spironolactone which I would recommend to continue.  She had an abnormal cardiac cath as above, and she will be evaluated by cardiology as an outpatient she appears to be on Plavix as well  Paroxysmal A. fib -Patient is off of her amiodarone, and she is on metoprolol for rate control as well as apixaban for anticoagulation.  On exam her heart appears to be in sinus rhythm, however will obtain an EKG to rule out rhythm abnormalities as a  cause of her weakness  Chronic kidney disease stage III -Baseline creatinine reported around 1.7, most recent value on 3/9 upon discharge was 1.39.  2 days ago on admission was 1.98.  Will repeat BMP today  Type 2 diabetes mellitus -Continue Lantus and sliding scale, CBGs fairly decent control.  Continue gabapentin for  neuropathy.  Hyperlipidemia -Continue statin  Hypertension -Continue losartan and metoprolol.  Based on creatinine may need to hold losartan tomorrow morning  GERD -Continue Protonix  Left ankle fracture -Status post ORIF with internal fixation and treatment of the left distal tibiofibular joint by Dr. Stann Mainland on 4/14   Scheduled Meds: . ALPRAZolam  0.5 mg Oral BID  . apixaban  5 mg Oral BID  . budesonide  0.25 mg Nebulization BID  . buPROPion  150 mg Oral Daily  . clopidogrel  75 mg Oral QHS  . docusate sodium  100 mg Oral BID  . DULoxetine  30 mg Oral Daily  . ezetimibe  10 mg Oral QHS  . gabapentin  600 mg Oral BID  . insulin aspart  0-15 Units Subcutaneous TID WC  . insulin aspart  0-5 Units Subcutaneous QHS  . insulin glargine  17 Units Subcutaneous QHS  . losartan  12.5 mg Oral Daily  . metoprolol succinate  50 mg Oral QPC breakfast  . pantoprazole  40 mg Oral Daily  . potassium chloride  20 mEq Oral BID  . rosuvastatin  40 mg Oral QHS  . spironolactone  25 mg Oral Daily  . torsemide  20 mg Oral Daily  . traMADol  50 mg Oral Q6H  . vitamin B-12  1,000 mcg Oral Daily   Continuous Infusions: PRN Meds:.acetaminophen, metoCLOPramide **OR** metoCLOPramide (REGLAN) injection, morphine injection, nitroGLYCERIN, ondansetron **OR** ondansetron (ZOFRAN) IV, oxyCODONE, oxyCODONE   The hospitalist team will follow-up again tomorrow.  Please contact us if we can be of assistance in the meanwhile.  Thank you for this consultation   Past Medical History:  Diagnosis Date  . Abnormality of gait 05/14/2014  . Anemia    hx  . Anginal pain ([place])   . Anxiety   . Asthma   . Basal cell carcinoma 05/2014   "left shoulder"  . Bundle branch block, left    chronic/notes 07/18/2013  . CHF (congestive heart failure) ([place])   . Chronic bronchitis ([place])    "off and on all the time" (07/18/2013)  . Chronic insomnia 05/06/2015  . Chronic kidney disease    frequency, sees dr Jamal Maes  every 4 to 6 months (01/16/2018)  . Chronic low back pain 08/24/2016  . Chronic lower back pain   . Claustrophobia   . Common migraine 05/14/2014  . Coronary artery disease   . Depression   . Dysrhythmia   . GERD (gastroesophageal reflux disease)   . H/O hiatal hernia   . Headache    "at least 2/month" (01/16/2018)  . Heart murmur   . Hyperlipidemia   . Hypertension   . Irregular heart beat   . Leg cramps    both legs at times  . Melanoma ([place]) 05/2014   "burned off BLE" (01/16/2018)  . Memory change 05/14/2014  . Migraine    "5-6/year"  (01/16/2018)  . Myocardial infarction ([place]) 04/1999, 02/2000, 01/2005; 2011; 2014  . Neuromuscular disorder ([place])    ?  . Obesity 01-2010  . Obstructive sleep apnea    "can't wear machine; I have claustrophobia" (01/16/2018), states she had a 2nd sleep study and does  not have sleep apnea, her O2 decreases and now is on 2 L of O2 at night.  . On home oxygen therapy    "2L at night and prn during daytime" (01/16/2018)  . Osteoarthritis    "knees and hands" (01/16/2018)  . Other and unspecified angina pectoris   . Peripheral vascular disease (H[facility])    ? numbness, tingling arms and legs  . Pneumonia 2000's   "once"  . PONV (postoperative nausea and vomiting)   . Shortness of breath    with exertion  . Stroke ([place]) 03/22/12   right side brain; denies residual on 07/18/2013)  . Stroke [facility]) Oct. 2015   Affected pt.s balance  . Stroke [facility]) 10/2014   "affected my legs; fully recovered now"; still have sporatic memory issues from this one" (01/16/2018)  . Type II diabetes mellitus ([place])   . Ventral hernia    hx of  . Vomiting    persistent  . Vomiting blood    Past Surgical History:  Procedure Laterality Date  . ANKLE FRACTURE SURGERY Left 1970's  . APPENDECTOMY  1970's   w/hysterectomy  . BASAL CELL CARCINOMA EXCISION Left 05/2014   "shoulder" (01/16/2018)  . CARDIAC CATHETERIZATION  10/10/2012   Dr [name].  Marland Kitchen CARDIAC CATHETERIZATION  N/A 05/29/2015   Procedure: Right/Left Heart Cath and Coronary/Graft Angiography;  Surgeon: Larey Dresser, MD;  Location: [place] [facility];  Service: Cardiovascular;  Laterality: N/A;  . CAROTID ENDARTERECTOMY Left 03/2013  . CAROTID STENT INSERTION Left 03/20/2013   Procedure: CAROTID STENT INSERTION;  Surgeon: [name], MD;  Location: [facility] CATH LAB;  Service: Cardiovascular;  Laterality: Left;  internal carotid  . CEREBRAL ANGIOGRAM N/A 04/05/2011   Procedure: CEREBRAL ANGIOGRAM;  Surgeon: Angelia Mould, MD;  Location: [facility] CATH LAB;  Service: Cardiovascular;  Laterality: N/A;  . CHOLECYSTECTOMY OPEN  2004  . CORONARY ANGIOPLASTY WITH STENT PLACEMENT  01,02,05,06,07,08,11; 04/24/2013   "I've probably got ~ 10 stents by now" (04/24/2013)  . CORONARY ANGIOPLASTY WITH STENT PLACEMENT  06/13/2013   "got 4 stents today" (06/13/2013)  . CORONARY ARTERY BYPASS GRAFT  1220/11   "CABG X5"  . CORONARY STENT INTERVENTION N/A 10/20/2017   Procedure: CORONARY STENT INTERVENTION;  Surgeon: Troy Sine, MD;  Location: [place] [facility];  Service: Cardiovascular;  Laterality: N/A;  . ESOPHAGOGASTRODUODENOSCOPY  08/03/2011   Procedure: ESOPHAGOGASTRODUODENOSCOPY (EGD);  Surgeon: Shann Medal, MD;  Location: [facility];  Service: General;  Laterality: N/A;  . ESOPHAGOGASTRODUODENOSCOPY (EGD) WITH PROPOFOL N/A 03/11/2014   Procedure: ESOPHAGOGASTRODUODENOSCOPY (EGD) WITH PROPOFOL;  Surgeon: Lafayette Dragon, MD;  Location: [facility];  Service: Endoscopy;  Laterality: N/A;  . FRACTURE SURGERY    . gall stone removal  05/2003  . GASTRIC RESTRICTION SURGERY  1984   "stapeling"  . HERNIA REPAIR  2004   "in my stomach; had OR on it twice", wire mesh on 1 hernia  . LEFT HEART CATH AND CORS/GRAFTS ANGIOGRAPHY N/A 07/09/2016   Procedure: LEFT HEART CATH AND CORS/GRAFTS ANGIOGRAPHY;  Surgeon: Larey Dresser, MD;  Location: [place] [facility];  Service: Cardiovascular;  Laterality: N/A;  . ORIF ANKLE  FRACTURE Left 05/16/2018  . OVARY SURGERY  1970's   "tumor removed"  . PERCUTANEOUS CORONARY STENT INTERVENTION (PCI-S) N/A 06/13/2013   Procedure: PERCUTANEOUS CORONARY STENT INTERVENTION (PCI-S);  Surgeon: [name], MD;  Location: [facility] CATH LAB;  Service: Cardiovascular;  Laterality: N/A;  . PERCUTANEOUS STENT INTERVENTION N/A 04/24/2013  Procedure: PERCUTANEOUS STENT INTERVENTION;  Surgeon: [name], MD;  Location: [facility] CATH LAB;  Service: Cardiovascular;  Laterality: N/A;  . RIGHT/LEFT HEART CATH AND CORONARY/GRAFT ANGIOGRAPHY N/A 10/20/2017   Procedure: RIGHT/LEFT HEART CATH AND CORONARY/GRAFT ANGIOGRAPHY;  Surgeon: Larey Dresser, MD;  Location: [place] [facility];  Service: Cardiovascular;  Laterality: N/A;  . ROOT CANAL  10/2000  . TIBIA FRACTURE SURGERY Right 1970's   rods and pins  . TOOTH EXTRACTION     "1 on the upper; wisdom tooth on the lower" (01/16/2018)  . TOTAL ABDOMINAL HYSTERECTOMY  1970's   w/ appendectomy   Social History:  reports that she has never smoked. She has never used smokeless tobacco. She reports that she does not drink alcohol or use drugs.  Allergies  Allergen Reactions  . Amoxicillin Shortness Of Breath and Rash  . Brilinta [Ticagrelor] Shortness Of Breath  . Erythromycin Shortness Of Breath, Other (See Comments) and Hives    Trouble swallowing  . Flagyl [Metronidazole] Shortness Of Breath and Palpitations  . Penicillins Hives, Shortness Of Breath, Rash and Other (See Comments)    Has patient had a PCN reaction causing immediate rash, facial/tongue/throat swelling, SOB or lightheadedness with hypotension: Yes Has patient had a PCN reaction causing severe rash involving mucus membranes or skin necrosis: No Has patient had a PCN reaction that required hospitalization: Yes Has patient had a PCN reaction occurring within the last 10 years: No If all of the above answers are "NO", then may proceed with Cephalosporin use.   . Isosorbide  Mononitrate [Isosorbide Nitrate] Other (See Comments)    Joint aches, muscles hurt, difficult to walk   . Jardiance [Empagliflozin] Other (See Comments)    Nausea, joint aches, muscles aches  . Metformin And Related Other (See Comments)    Stomach pain, cold sweats, joint pain, burred vision, dizziness  . Tape Other (See Comments)    Skin pulls off with certain types Plastic tape causes skin to rip if left on for long periods of time  . Erythromycin Base Rash   Family History  Problem Relation Age of Onset  . Heart disease Father        Heart Disease before age 58  . Diabetes Father   . Hyperlipidemia Father   . Hypertension Father   . Heart attack Father   . Deep vein thrombosis Father   . AAA (abdominal aortic aneurysm) Father   . Hypertension Mother   . Deep vein thrombosis Mother   . AAA (abdominal aortic aneurysm) Mother   . Cancer Sister        Ovarian  . Hypertension Sister   . Diabetes Paternal Grandmother   . Heart disease Paternal Uncle   . Diabetes Paternal Uncle   . Heart attack Paternal Grandfather 34       died of MI at 50  . Diabetes Paternal Aunt   . Diabetes Paternal Uncle   . Colon cancer Neg Hx     Prior to Admission medications   Medication Sig Start Date End Date Taking? Authorizing Provider  ALPRAZolam Duanne Moron) 0.5 MG tablet Take 1 tablet (0.5 mg total) by mouth 2 (two) times daily. 04/10/18  Yes Cherene Altes, MD  budesonide (PULMICORT) 0.25 MG/2ML nebulizer solution Take 2 mLs (0.25 mg total) by nebulization 2 (two) times daily. 04/10/18  Yes Cherene Altes, MD  buPROPion (WELLBUTRIN XL) 150 MG 24 hr tablet Take 150 mg by mouth daily.   Yes [provider]  DULoxetine (CYMBALTA) 30 MG capsule Take 1 capsule (30 mg total) by mouth daily. 04/11/18  Yes Cherene Altes, MD  ezetimibe (ZETIA) 10 MG tablet Take 1 tablet (10 mg total) by mouth at bedtime. 04/10/18  Yes Cherene Altes, MD  gabapentin (NEURONTIN) 300 MG capsule Take 2  capsules (600 mg total) by mouth 2 (two) times daily. 04/10/18  Yes Cherene Altes, MD  HYDROcodone-acetaminophen (NORCO/VICODIN) 5-325 MG tablet Take 1-2 tablets by mouth every 4 (four) hours as needed for severe pain. Patient taking differently: Take 1 tablet by mouth 2 (two) times daily as needed for severe pain.  05/04/18  Yes Nuala Alpha A, PA-C  insulin aspart (NOVOLOG) 100 UNIT/ML injection Inject 8 Units into the skin 3 (three) times daily with meals. 04/10/18  Yes Cherene Altes, MD  insulin glargine (LANTUS) 100 UNIT/ML injection Inject 0.35 mLs (35 Units total) into the skin at bedtime. 04/10/18  Yes Cherene Altes, MD  ipratropium-albuterol (DUONEB) 0.5-2.5 (3) MG/3ML SOLN Take 3 mLs by nebulization 2 (two) times daily. Patient taking differently: Take 3 mLs by nebulization 2 (two) times daily as needed (shortness of breath).  04/10/18  Yes Cherene Altes, MD  losartan (COZAAR) 25 MG tablet Take 0.5 tablets (12.5 mg total) by mouth daily. Patient taking differently: Take 12.5 mg by mouth at bedtime.  04/11/18  Yes Cherene Altes, MD  metoprolol succinate (TOPROL-XL) 50 MG 24 hr tablet Take 1 tablet (50 mg total) by mouth daily. Take with or immediately following a meal. 04/10/18  Yes Cherene Altes, MD  Multiple Vitamins-Minerals (HEALTHY EYES/LUTEIN) TABS Take 1 tablet by mouth daily.   Yes [provider]  nitroGLYCERIN (NITROSTAT) 0.3 MG SL tablet Place 1 tablet (0.3 mg total) under the tongue every 5 (five) minutes x 3 doses as needed for chest pain. 09/15/17  Yes Georgiana Shore, NP  pantoprazole (PROTONIX) 40 MG tablet Take 1 tablet (40 mg total) by mouth daily. 04/11/18  Yes Cherene Altes, MD  rosuvastatin (CRESTOR) 40 MG tablet Take 1 tablet (40 mg total) by mouth at bedtime. 04/10/18  Yes Cherene Altes, MD  spironolactone (ALDACTONE) 25 MG tablet Take 1 tablet (25 mg total) by mouth daily. 04/11/18  Yes Cherene Altes, MD  torsemide (DEMADEX) 20  MG tablet Take 1 tablet (20 mg total) by mouth daily. 04/11/18  Yes Cherene Altes, MD  vitamin B-12 (CYANOCOBALAMIN) 1000 MCG tablet Take 1,000 mcg by mouth daily.   Yes [provider]  apixaban (ELIQUIS) 5 MG TABS tablet Take 1 tablet (5 mg total) by mouth 2 (two) times daily. 05/12/18   Larey Dresser, MD  clopidogrel (PLAVIX) 75 MG tablet Take 1 tablet (75 mg total) by mouth at bedtime. 05/12/18   Larey Dresser, MD  OXYGEN Inhale 2 L into the lungs at bedtime.     [provider]  predniSONE (DELTASONE) 10 MG tablet 40mg  po QD x7 days, then 30mg  QD x7 days, then 20mg  QD x7 days, then 10mg  QD x7 days, then stop Patient not taking: Reported on 05/12/2018 04/10/18   Cherene Altes, MD   Physical Exam: Blood pressure 134/84, pulse (!) 59, temperature 98.5 F (36.9 C), temperature source Oral, resp. rate 16, height 5' 2.5" (1.588 m), weight 114.3 kg, SpO2 97 %. Vitals:   05/18/18 0830 05/18/18 1351  BP:  134/84  Pulse:  (!) 59  Resp:  16  Temp:  98.5 F (36.9 C)  SpO2: 98% 97%     General: No distress, pleasant female sitting up in the chair  Eyes: No scleral icterus, pupils equal round reactive to light  ENT: NCAT  Neck: Supple, no JVD  Cardiovascular: Regular rate and rhythm, no peripheral edema.  Respiratory: Decreased breath sounds at the bases but overall clear, no wheezing or significant crackles heard  Abdomen: Soft, nontender, nondistended, bowel sounds positive  Skin: No rashes seen  Neurologic: Alert and oriented x4, appropriate, equal strength.  Labs on Admission:  Basic Metabolic Panel: Recent Labs  Lab 05/16/18 0623  NA 141  K 3.0*  CL 99  CO2 28  GLUCOSE 205*  BUN 19  CREATININE 1.98*  CALCIUM 9.7   Liver Function Tests: No results for input(s): AST, ALT, ALKPHOS, BILITOT, PROT, ALBUMIN in the last 168 hours. No results for input(s): LIPASE, AMYLASE in the last 168 hours. No results for input(s): AMMONIA in the last 168  hours. CBC: Recent Labs  Lab 05/16/18 0623  WBC 12.2*  HGB 12.9  HCT 39.0  MCV 97.5  PLT 197   Cardiac Enzymes: No results for input(s): CKTOTAL, CKMB, CKMBINDEX, TROPONINI in the last 168 hours. BNP: Invalid input(s): POCBNP CBG: Recent Labs  Lab 05/17/18 1215 05/17/18 1716 05/17/18 2115 05/18/18 0803 05/18/18 1158  GLUCAP 246* 240* 208* 138* 185*    Radiological Exams on Admission: No results found.  EKG: Independently reviewed. pending  Time spent: 60 minutes  [facility] Pager [phone]  If 7PM-7AM, please contact night-coverage [email or web address] Password [facility] 05/18/2018, 2:27 PM

## 2018-05-19 ENCOUNTER — Encounter (HOSPITAL_COMMUNITY): Payer: Self-pay | Admitting: Orthopedic Surgery

## 2018-05-19 ENCOUNTER — Ambulatory Visit (HOSPITAL_COMMUNITY): Payer: Medicare Other

## 2018-05-19 DIAGNOSIS — S82892D Other fracture of left lower leg, subsequent encounter for closed fracture with routine healing: Secondary | ICD-10-CM

## 2018-05-19 LAB — BASIC METABOLIC PANEL
Anion gap: 7 (ref 5–15)
BUN: 18 mg/dL (ref 8–23)
CO2: 33 mmol/L — ABNORMAL HIGH (ref 22–32)
Calcium: 9 mg/dL (ref 8.9–10.3)
Chloride: 97 mmol/L — ABNORMAL LOW (ref 98–111)
Creatinine, Ser: 1.85 mg/dL — ABNORMAL HIGH (ref 0.44–1.00)
GFR calc Af Amer: 31 mL/min — ABNORMAL LOW (ref >60)
GFR calc non Af Amer: 27 mL/min — ABNORMAL LOW (ref >60)
Glucose, Bld: 175 mg/dL — ABNORMAL HIGH (ref 70–99)
Potassium: 4.1 mmol/L (ref 3.5–5.1)
Sodium: 137 mmol/L (ref 135–145)

## 2018-05-19 LAB — GLUCOSE, CAPILLARY
Glucose-Capillary: 166 mg/dL — ABNORMAL HIGH (ref 70–99)
Glucose-Capillary: 183 mg/dL — ABNORMAL HIGH (ref 70–99)

## 2018-05-19 MED ORDER — OXYCODONE HCL 5 MG PO TABS: 5.0000 mg | ORAL_TABLET | ORAL | 0 refills | Status: DC | PRN

## 2018-05-19 MED ORDER — BISACODYL 5 MG PO TBEC: 5.0000 mg | DELAYED_RELEASE_TABLET | Freq: Every day | ORAL | 1 refills | Status: AC | PRN

## 2018-05-19 MED ORDER — TRAMADOL HCL 50 MG PO TABS: 50.0000 mg | ORAL_TABLET | Freq: Four times a day (QID) | ORAL | 0 refills | Status: DC | PRN

## 2018-05-19 NOTE — Social Work (Addendum)
Clinical Social Worker facilitated patient discharge including contacting patient family and facility to confirm patient discharge plans.  Clinical information faxed to facility and family agreeable with plan.  CSW arranged ambulance transport via [place] (pt husband previously arranged) to [facility] RN to call [phone] for the 400 hall RN  with report prior to discharge.  Hospitalist note and updated Ortho MD notes were faxed to [facility] [phone].  Clinical Social Worker will sign off for now as social work intervention is no longer needed. Please consult [place] again if new need arises.  Westley Hummer, MSW, [place] Social Worker [phone]

## 2018-05-19 NOTE — Discharge Summary (Addendum)
Patient ID: [name] [name] [name]: [id number] DOB/AGE: [birth year] 73 y.o.  Admit date: 05/16/2018 Discharge date:   Primary Diagnosis: Left ankle fracture  Admission Diagnoses:  Past Medical History:  Diagnosis Date  . Abnormality of gait 05/14/2014  . Anemia    hx  . Anginal pain ([place])   . Anxiety   . Asthma   . Basal cell carcinoma 05/2014   "left shoulder"  . Bundle branch block, left    chronic/notes 07/18/2013  . CHF (congestive heart failure) ([place])   . Chronic bronchitis ([place])    "off and on all the time" (07/18/2013)  . Chronic insomnia 05/06/2015  . Chronic kidney disease    frequency, sees dr Jamal Maes every 4 to 6 months (01/16/2018)  . Chronic low back pain 08/24/2016  . Chronic lower back pain   . Claustrophobia   . Common migraine 05/14/2014  . Coronary artery disease   . Depression   . Dysrhythmia   . GERD (gastroesophageal reflux disease)   . H/O hiatal hernia   . Headache    "at least 2/month" (01/16/2018)  . Heart murmur   . Hyperlipidemia   . Hypertension   . Irregular heart beat   . Leg cramps    both legs at times  . Melanoma ([place]) 05/2014   "burned off BLE" (01/16/2018)  . Memory change 05/14/2014  . Migraine    "5-6/year"  (01/16/2018)  . Myocardial infarction ([place]) 04/1999, 02/2000, 01/2005; 2011; 2014  . Neuromuscular disorder ([place])    ?  . Obesity 01-2010  . Obstructive sleep apnea    "can't wear machine; I have claustrophobia" (01/16/2018), states she had a 2nd sleep study and does not have sleep apnea, her O2 decreases and now is on 2 L of O2 at night.  . On home oxygen therapy    "2L at night and prn during daytime" (01/16/2018)  . Osteoarthritis    "knees and hands" (01/16/2018)  . Other and unspecified angina pectoris   . Peripheral vascular disease (HCC)    ? numbness, tingling arms and legs  . Pneumonia 2000's   "once"  . PONV (postoperative nausea and vomiting)   . Shortness of breath    with exertion  . Stroke ([place]) 03/22/12    right side brain; denies residual on 07/18/2013)  . Stroke [facility]) Oct. 2015   Affected pt.s balance  . Stroke [facility]) 10/2014   "affected my legs; fully recovered now"; still have sporatic memory issues from this one" (01/16/2018)  . Type II diabetes mellitus ([place])   . Ventral hernia    hx of  . Vomiting    persistent  . Vomiting blood    Discharge Diagnoses:   Active Problems:   Ankle fracture, left  Estimated body mass index is 45.36 kg/m as calculated from the following:   Height as of this encounter: 5' 2.5" (1.588 m).   Weight as of this encounter: 114.3 kg.  Procedure:  Procedure(s) (LRB): OPEN REDUCTION INTERNAL FIXATION (ORIF) Left ankle with possible syndesmosis fixation (Left)   Consults: Hospitalist  HPI: [name] [name] is a 73 year old female that had an unfortunate low-energy left ankle fracture.  She was indicated for operative fixation due to the unstable nature of that injury.  She was admitted for the above injury and subsequent surgical management and postoperative care. Laboratory Data: Admission on 05/16/2018  Component Date Value Ref Range Status  . Glucose-Capillary 05/16/2018 188* 70 - 99 mg/dL Final  . Comment 1 05/16/2018  Notify RN   Final  . Comment 2 05/16/2018 Document in Chart   Final  . Hgb A1c MFr Bld 05/16/2018 6.9* 4.8 - 5.6 % Final   Comment: (NOTE)         Prediabetes: 5.7 - 6.4         Diabetes: >6.4         Glycemic control for adults with diabetes: <7.0   . Mean Plasma Glucose 05/16/2018 151  mg/dL Final   Comment: (NOTE) Performed At: [facility] [place], [place] [id number] Rush Farmer MD YK:[id number]   . Sodium 05/16/2018 141  135 - 145 mmol/L Final  . Potassium 05/16/2018 3.0* 3.5 - 5.1 mmol/L Final  . Chloride 05/16/2018 99  98 - 111 mmol/L Final  . CO2 05/16/2018 28  22 - 32 mmol/L Final  . Glucose, Bld 05/16/2018 205* 70 - 99 mg/dL Final  . BUN 05/16/2018 19  8 - 23 mg/dL Final  . Creatinine, Ser  05/16/2018 1.98* 0.44 - 1.00 mg/dL Final  . Calcium 05/16/2018 9.7  8.9 - 10.3 mg/dL Final  . GFR calc non Af Amer 05/16/2018 25* >60 mL/min Final  . GFR calc Af Amer 05/16/2018 29* >60 mL/min Final  . Anion gap 05/16/2018 14  5 - 15 Final   Performed at [facility], [place] [address] [place], [place] [address]  . WBC 05/16/2018 12.2* 4.0 - 10.5 K/uL Final  . RBC 05/16/2018 4.00  3.87 - 5.11 MIL/uL Final  . Hemoglobin 05/16/2018 12.9  12.0 - 15.0 g/dL Final  . HCT 05/16/2018 39.0  36.0 - 46.0 % Final  . MCV 05/16/2018 97.5  80.0 - 100.0 fL Final  . MCH 05/16/2018 32.3  26.0 - 34.0 pg Final  . MCHC 05/16/2018 33.1  30.0 - 36.0 g/dL Final  . RDW 05/16/2018 15.2  11.5 - 15.5 % Final  . Platelets 05/16/2018 197  150 - 400 K/uL Final  . nRBC 05/16/2018 0.0  0.0 - 0.2 % Final   Performed at [facility], [place] [address] [place], [place] [address]  . Prothrombin Time 05/16/2018 14.6  11.4 - 15.2 seconds Final  . INR 05/16/2018 1.2  0.8 - 1.2 Final   Comment: (NOTE) INR goal varies based on device and disease states. Performed at [facility], [place] [address] [place], [place] [address]   . Glucose-Capillary 05/16/2018 203* 70 - 99 mg/dL Final  . Glucose-Capillary 05/16/2018 224* 70 - 99 mg/dL Final  . Glucose-Capillary 05/16/2018 399* 70 - 99 mg/dL Final  . Glucose-Capillary 05/16/2018 306* 70 - 99 mg/dL Final  . Glucose-Capillary 05/16/2018 246* 70 - 99 mg/dL Final  . Glucose-Capillary 05/17/2018 165* 70 - 99 mg/dL Final  . Glucose-Capillary 05/17/2018 246* 70 - 99 mg/dL Final  . Glucose-Capillary 05/17/2018 240* 70 - 99 mg/dL Final  . Glucose-Capillary 05/17/2018 208* 70 - 99 mg/dL Final  . Glucose-Capillary 05/18/2018 138* 70 - 99 mg/dL Final  . Glucose-Capillary 05/18/2018 185* 70 - 99 mg/dL Final  . WBC 05/18/2018 13.2* 4.0 - 10.5 K/uL Final  . RBC 05/18/2018 3.27* 3.87 - 5.11 MIL/uL Final  . Hemoglobin 05/18/2018 10.7* 12.0 - 15.0 g/dL Final  . HCT 05/18/2018  31.8* 36.0 - 46.0 % Final  . MCV 05/18/2018 97.2  80.0 - 100.0 fL Final  . MCH 05/18/2018 32.7  26.0 - 34.0 pg Final  . MCHC 05/18/2018 33.6  30.0 - 36.0 g/dL Final  . RDW 05/18/2018 15.3  11.5 - 15.5 % Final  . Platelets 05/18/2018 175  150 - 400 K/uL Final  . nRBC 05/18/2018 0.0  0.0 - 0.2 % Final   Performed at [facility], [place] [address] [place], [place] [address]  . Sodium 05/18/2018 136  135 - 145 mmol/L Final  . Potassium 05/18/2018 4.0  3.5 - 5.1 mmol/L Final  . Chloride 05/18/2018 96* 98 - 111 mmol/L Final  . CO2 05/18/2018 29  22 - 32 mmol/L Final  . Glucose, Bld 05/18/2018 203* 70 - 99 mg/dL Final  . BUN 05/18/2018 18  8 - 23 mg/dL Final  . Creatinine, Ser 05/18/2018 1.95* 0.44 - 1.00 mg/dL Final  . Calcium 05/18/2018 9.1  8.9 - 10.3 mg/dL Final  . Total Protein 05/18/2018 5.8* 6.5 - 8.1 g/dL Final  . Albumin 05/18/2018 2.9* 3.5 - 5.0 g/dL Final  . AST 05/18/2018 21  15 - 41 U/L Final  . ALT 05/18/2018 22  0 - 44 U/L Final  . Alkaline Phosphatase 05/18/2018 79  38 - 126 U/L Final  . Total Bilirubin 05/18/2018 1.3* 0.3 - 1.2 mg/dL Final  . GFR calc non Af Amer 05/18/2018 25* >60 mL/min Final  . GFR calc Af Amer 05/18/2018 29* >60 mL/min Final  . Anion gap 05/18/2018 11  5 - 15 Final   Performed at [facility], [place] [address], [place], [place] [address]  . Glucose-Capillary 05/18/2018 205* 70 - 99 mg/dL Final  . Sodium 05/19/2018 137  135 - 145 mmol/L Final  . Potassium 05/19/2018 4.1  3.5 - 5.1 mmol/L Final  . Chloride 05/19/2018 97* 98 - 111 mmol/L Final  . CO2 05/19/2018 33* 22 - 32 mmol/L Final  . Glucose, Bld 05/19/2018 175* 70 - 99 mg/dL Final  . BUN 05/19/2018 18  8 - 23 mg/dL Final  . Creatinine, Ser 05/19/2018 1.85* 0.44 - 1.00 mg/dL Final  . Calcium 05/19/2018 9.0  8.9 - 10.3 mg/dL Final  . GFR calc non Af Amer 05/19/2018 27* >60 mL/min Final  . GFR calc Af Amer 05/19/2018 31* >60 mL/min Final  . Anion gap 05/19/2018 7  5 - 15 Final   Performed  at [place] [address] [place], [place] [address]  . Glucose-Capillary 05/18/2018 175* 70 - 99 mg/dL Final  . Glucose-Capillary 05/19/2018 166* 70 - 99 mg/dL Final  . Glucose-Capillary 05/19/2018 183* 70 - 99 mg/dL Final  No results displayed because visit has over 200 results.       X-Rays:Dg Ankle 2 Views Left  Result Date: 05/04/2018 CLINICAL DATA:  Status post reduction of left ankle fracture. EXAM: LEFT ANKLE - 2 VIEW COMPARISON:  Radiographs of same day. FINDINGS: The left ankle has been splinted. Talar dislocation noted on prior exam is significantly improved, although mild dislocation laterally is noted. Moderately displaced distal left fibular fracture is noted. IMPRESSION: Significantly improved alignment of previously described lateral talar dislocation. Stable distal left fibular fracture. Electronically Signed   By: Marijo Conception, M.D.   On: 05/04/2018 20:16   Dg Ankle Complete Left  Result Date: 05/16/2018 CLINICAL DATA:  73 year old female with a history of open reduction internal fixation EXAM: LEFT ANKLE COMPLETE - 3+ VIEW; DG C-ARM 61-120 MIN COMPARISON:  05/04/2018 FINDINGS: Limited intraoperative fluoroscopic spot images demonstrating open reduction internal fixation of left fibular fracture with syndesmotic fixation, as well as retrograde intramedullary rod of the fibula. Alignment improved. IMPRESSION: Limited intraoperative spot images of left  ankle fracture/subluxation ORIF, without complicating features. Please refer to the dictated operative report for full details of intraoperative findings and procedure. Electronically Signed   By: Corrie Mckusick D.O.   On: 05/16/2018 09:58   Dg Ankle Complete Left  Result Date: 05/04/2018 CLINICAL DATA:  Pain. Twisting injury. EXAM: LEFT ANKLE COMPLETE - 3+ VIEW COMPARISON:  None. FINDINGS: There is a lateral malleolar fracture, with disruption of the ankle mortise. The tibia is displaced medially on the talus. There is  marked soft tissue swelling. Vascular calcification is noted. Plantar and Achilles spurs are present. IMPRESSION: Lateral malleolar fracture dislocation, with disruption of the ankle mortise. Electronically Signed   By: Staci Righter M.D.   On: 05/04/2018 17:10   Dg Chest Port 1 View  Result Date: 05/18/2018 CLINICAL DATA:  Weakness in the arms and legs. Chest pressure. Fatigue. History of COPD. EXAM: PORTABLE CHEST 1 VIEW COMPARISON:  04/05/2018 FINDINGS: Sequelae of CABG are again identified. Central venous catheter has been removed. A CardioMEMS device is unchanged. The cardiac silhouette remains mildly enlarged. There is an improved appearance of the pulmonary interstitium with mild central pulmonary vascular congestion but no overt edema currently. No airspace consolidation, pleural effusion, pneumothorax is identified. No acute osseous abnormality is seen. IMPRESSION: Cardiomegaly and mild pulmonary vascular congestion, improved from prior. Electronically Signed   By: Logan Bores M.D.   On: 05/18/2018 15:11   Dg Ankle Left Port  Result Date: 05/04/2018 CLINICAL DATA:  Post reduction. EXAM: PORTABLE LEFT ANKLE - 1 VIEW COMPARISON:  Ankle series earlier today. FINDINGS: AP ankle film, one view only, demonstrates improved ankle mortise alignment. The tibia has been reduced to have a more anatomic alignment with the talus. Distal fibula and lateral malleolus are suboptimally positioned, either anterior or posterior to the talar articulation. IMPRESSION: Improved ankle mortise alignment. Electronically Signed   By: Staci Righter M.D.   On: 05/04/2018 19:00   Dg C-arm 1-60 Min  Result Date: 05/16/2018 CLINICAL DATA:  73 year old female with a history of open reduction internal fixation EXAM: LEFT ANKLE COMPLETE - 3+ VIEW; DG C-ARM 61-120 MIN COMPARISON:  05/04/2018 FINDINGS: Limited intraoperative fluoroscopic spot images demonstrating open reduction internal fixation of left fibular fracture with  syndesmotic fixation, as well as retrograde intramedullary rod of the fibula. Alignment improved. IMPRESSION: Limited intraoperative spot images of left ankle fracture/subluxation ORIF, without complicating features. Please refer to the dictated operative report for full details of intraoperative findings and procedure. Electronically Signed   By: Corrie Mckusick D.O.   On: 05/16/2018 09:58    EKG: Orders placed or performed during the hospital encounter of 05/16/18  . EKG 12-Lead  . EKG 12-Lead  . EKG 12-Lead  . EKG 12-Lead  . EKG 12-Lead  . EKG 12-Lead   *Note: Due to a large number of results and/or encounters for the requested time period, some results have not been displayed. A complete set of results can be found in Results Review.     Hospital Course: [name] [name] is a 73 y.o. who was admitted to Hospital. They were brought to the operating room on 05/16/2018 and underwent Procedure(s): OPEN REDUCTION INTERNAL FIXATION (ORIF) Left ankle with possible syndesmosis fixation.  Patient tolerated the procedure well and was later transferred to the recovery room and then to the orthopaedic floor for postoperative care.  They were given PO and IV analgesics for pain control following their surgery.  They were given 24 hours of postoperative antibiotics of  Anti-infectives (  From admission, onward)   Start     Dose/Rate Route Frequency Ordered Stop   05/16/18 1300  clindamycin (CLEOCIN) IVPB 600 mg     600 mg 100 mL/hr over 30 Minutes Intravenous Every 6 hours 05/16/18 1100 05/17/18 0113   05/16/18 0645  vancomycin (VANCOCIN) 1,500 mg in sodium chloride 0.9 % 500 mL IVPB     1,500 mg 250 mL/hr over 120 Minutes Intravenous  Once 05/16/18 0635 05/16/18 0913   05/16/18 0645  clindamycin (CLEOCIN) IVPB 900 mg  Status:  Discontinued     900 mg 100 mL/hr over 30 Minutes Intravenous  Once 05/16/18 0635 05/16/18 1051     and started on DVT prophylaxis in the form of resumed eluiquis and plavix.   PT  was ordered.  Discharge planning consulted to help with postop disposition and equipment needs.  Patient had a good night on the evening of surgery.  She did complain of some increasing fatigue and being a bit dizzy when she got up out of bed.  We had her assessed by the hospitalist service.  She has multiple medical comorbidities that seem to be at her baseline.  After extensive work-up including orthostatic hypotension tension analysis as well as routine laboratory work-up this appeared to be of benign etiology.  She has marked deconditioning secondary to multiple hospitalizations in this short year.  After the work-up of the subjective complaints was complete we felt her appropriate to return back to the skilled nursing facility to resume physical and occupational therapy to help with her deconditioning.  Diet: Diabetic diet Activity:NWB Follow-up:in 2 weeks Disposition - Skilled nursing facility Discharged Condition: good   Discharge Instructions    Call MD / Call 911   Complete by:  As directed    If you experience chest pain or shortness of breath, CALL 911 and be transported to the hospital emergency room.  If you develope a fever above 101 F, pus (white drainage) or increased drainage or redness at the wound, or calf pain, call your surgeon's office.   Constipation Prevention   Complete by:  As directed    Drink plenty of fluids.  Prune juice may be helpful.  You may use a stool softener, such as Colace (over the counter) 100 mg twice a day.  Use MiraLax (over the counter) for constipation as needed.   Diet - low sodium heart healthy   Complete by:  As directed    Increase activity slowly as tolerated   Complete by:  As directed      Allergies as of 05/19/2018      Reactions   Amoxicillin Shortness Of Breath, Rash   Brilinta [ticagrelor] Shortness Of Breath   Erythromycin Shortness Of Breath, Other (See Comments), Hives   Trouble swallowing   Flagyl [metronidazole] Shortness Of  Breath, Palpitations   Penicillins Hives, Shortness Of Breath, Rash, Other (See Comments)   Has patient had a PCN reaction causing immediate rash, facial/tongue/throat swelling, SOB or lightheadedness with hypotension: Yes Has patient had a PCN reaction causing severe rash involving mucus membranes or skin necrosis: No Has patient had a PCN reaction that required hospitalization: Yes Has patient had a PCN reaction occurring within the last 10 years: No If all of the above answers are "NO", then may proceed with Cephalosporin use.   Isosorbide Mononitrate [isosorbide Nitrate] Other (See Comments)   Joint aches, muscles hurt, difficult to walk   Jardiance [empagliflozin] Other (See Comments)   Nausea, joint aches, muscles aches  Metformin And Related Other (See Comments)   Stomach pain, cold sweats, joint pain, burred vision, dizziness   Tape Other (See Comments)   Skin pulls off with certain types Plastic tape causes skin to rip if left on for long periods of time   Erythromycin Base Rash      Medication List    STOP taking these medications   HYDROcodone-acetaminophen 5-325 MG tablet Commonly known as:  NORCO/VICODIN     TAKE these medications   ALPRAZolam 0.5 MG tablet Commonly known as:  XANAX Take 1 tablet (0.5 mg total) by mouth 2 (two) times daily.   apixaban 5 MG Tabs tablet Commonly known as:  ELIQUIS Take 1 tablet (5 mg total) by mouth 2 (two) times daily.   bisacodyl 5 MG EC tablet Commonly known as:  Dulcolax Take 1 tablet (5 mg total) by mouth daily as needed for moderate constipation.   budesonide 0.25 MG/2ML nebulizer solution Commonly known as:  PULMICORT Take 2 mLs (0.25 mg total) by nebulization 2 (two) times daily.   buPROPion 150 MG 24 hr tablet Commonly known as:  WELLBUTRIN XL Take 150 mg by mouth daily.   clopidogrel 75 MG tablet Commonly known as:  PLAVIX Take 1 tablet (75 mg total) by mouth at bedtime.   DULoxetine 30 MG capsule Commonly  known as:  CYMBALTA Take 1 capsule (30 mg total) by mouth daily.   ezetimibe 10 MG tablet Commonly known as:  ZETIA Take 1 tablet (10 mg total) by mouth at bedtime.   gabapentin 300 MG capsule Commonly known as:  NEURONTIN Take 2 capsules (600 mg total) by mouth 2 (two) times daily.   Healthy Eyes/Lutein Tabs Take 1 tablet by mouth daily.   insulin aspart 100 UNIT/ML injection Commonly known as:  novoLOG Inject 8 Units into the skin 3 (three) times daily with meals.   insulin glargine 100 UNIT/ML injection Commonly known as:  LANTUS Inject 0.35 mLs (35 Units total) into the skin at bedtime.   ipratropium-albuterol 0.5-2.5 (3) MG/3ML Soln Commonly known as:  DUONEB Take 3 mLs by nebulization 2 (two) times daily. What changed:    when to take this  reasons to take this   losartan 25 MG tablet Commonly known as:  COZAAR Take 0.5 tablets (12.5 mg total) by mouth daily. What changed:  when to take this   metoprolol succinate 50 MG 24 hr tablet Commonly known as:  TOPROL-XL Take 1 tablet (50 mg total) by mouth daily. Take with or immediately following a meal.   nitroGLYCERIN 0.3 MG SL tablet Commonly known as:  NITROSTAT Place 1 tablet (0.3 mg total) under the tongue every 5 (five) minutes x 3 doses as needed for chest pain.   oxyCODONE 5 MG immediate release tablet Commonly known as:  Oxy IR/ROXICODONE Take 1 tablet (5 mg total) by mouth every 4 (four) hours as needed for moderate pain or severe pain.   OXYGEN Inhale 2 L into the lungs at bedtime.   pantoprazole 40 MG tablet Commonly known as:  PROTONIX Take 1 tablet (40 mg total) by mouth daily.      rosuvastatin 40 MG tablet Commonly known as:  CRESTOR Take 1 tablet (40 mg total) by mouth at bedtime.   spironolactone 25 MG tablet Commonly known as:  ALDACTONE Take 1 tablet (25 mg total) by mouth daily.   torsemide 20 MG tablet Commonly known as:  DEMADEX Take 1 tablet (20 mg total) by mouth daily.  traMADol 50 MG tablet Commonly known as:  ULTRAM Take 1 tablet (50 mg total) by mouth every 6 (six) hours as needed for moderate pain.   vitamin B-12 1000 MCG tablet Commonly known as:  CYANOCOBALAMIN Take 1,000 mcg by mouth daily.      Contact information for after-discharge care    Destination    [facility] .   Service:  Skilled Nursing Contact information: [address] [place] [phone]              Signed: Geralynn Rile, MD Orthopaedic Surgery 05/19/2018, 12:30 PM

## 2018-05-19 NOTE — TOC Transition Note (Signed)
Transition of Care [facility]) - CM/SW Discharge Note   Patient Details  Name: [name] [name] [name]: [id number] Date of Birth: 1945-02-06  Transition [facility]) CM/SW Contact:  Alexander [place], [place] Phone Number: 05/19/2018, 1:29 PM   Clinical Narrative:    Pt pending discharge based on PT orthostatics.  All information sent to [facility], they are ready for pt.  Called bedside RN and let them know that information has been sent, need to verify signed prescriptions for pt. Pt husband [name] has arranged transportation through [employer] to pick pt up at 3:30pm. Should pt not be discharged today CSW will let pt husband know and he will rearrange pick up.    Final next level of care: [place] Barriers to Discharge: Continued Medical Work up   Patient Goals and CMS Choice Patient states their goals for this hospitalization and ongoing recovery are:: to return to [facility].gov Compare Post Acute Care list provided to:: Patient Represenative (must comment) Choice offered to / list presented to : Patient  Discharge Placement   Existing PASRR number confirmed : 05/18/18          Patient [facility] bed at: [facility] Patient to be transferred to facility by: [facility] Name of family member notified: pt husband [name] Patient and family notified of of transfer: 05/19/18  Discharge Plan and Services In-house Referral: Clinical Social Work Discharge Planning Services: NA Post Acute Care Choice: [place]                    Social Determinants of Health (SDOH) Interventions     Readmission Risk Interventions Readmission Risk Prevention Plan 05/17/2018  Transportation Screening Complete  PCP or Specialist Appt within 3-5 Days Complete  HRI or [place] Complete  Social Work Consult for [place] Planning/Counseling Complete  Palliative Care Screening Not Applicable  Medication Review Press photographer)  Complete  Some recent data might be hidden

## 2018-05-19 NOTE — Care Management Important Message (Signed)
Important Message  Patient Details  Name: [name] [name] [name]: [id number] Date of Birth: [birth year]   Medicare Important Message Given:  Yes    Orbie Pyo 05/19/2018, 4:00 PM

## 2018-05-19 NOTE — Progress Notes (Signed)
   Subjective:  Patient reports pain as moderate.  Pain better controlled today.  Continues to feel weak,  But denies SOB/CP or N/V.  toelrating PO.  Objective:   VITALS:   Vitals:   05/18/18 2111 05/18/18 2122 05/19/18 0521 05/19/18 0850  BP:  (!) 129/44 130/77   Pulse: 62 (!) 54 61   Resp: 20  18   Temp:  98.2 F (36.8 C) 98 F (36.7 C)   TempSrc:  Oral Oral   SpO2: 98% 93% 95% 98%  Weight:      Height:        Neurovascular intact Sensation intact distally Intact pulses distally Dorsiflexion/Plantar flexion intact Incision: dressing C/D/I Compartment soft   Lab Results  Component Value Date   WBC 13.2 (H) 05/18/2018   HGB 10.7 (L) 05/18/2018   HCT 31.8 (L) 05/18/2018   MCV 97.2 05/18/2018   PLT 175 05/18/2018   BMET    Component Value Date/Time   NA 137 05/19/2018 0121   K 4.1 05/19/2018 0121   CL 97 (L) 05/19/2018 0121   CO2 33 (H) 05/19/2018 0121   GLUCOSE 175 (H) 05/19/2018 0121   BUN 18 05/19/2018 0121   CREATININE 1.85 (H) 05/19/2018 0121   CREATININE 1.07 (H) 05/28/2015 1230   CALCIUM 9.0 05/19/2018 0121   GFRNONAA 27 (L) 05/19/2018 0121   GFRAA 31 (L) 05/19/2018 0121     Assessment/Plan: 3 Days Post-Op   Active Problems:   Ankle fracture, left   - continue NWB LLE with PT  - elevate  -Patient hospitalist service for their evaluation and recommendations.  She appears to have generalized weakness secondary to being deconditioned.  She does have mild acute blood loss anemia but above 10 on yesterday CBC.  Her creatinine is also just above baseline and trending down from yesterday's lab.  - continue SSi and carb mod diet for FEN, no iv fluids at this time  - dc back to her SNF today follow orthostatic BP check by therapy  -  Follow up 2 weeks with me   Nicholes Stairs 05/19/2018, 12:24 PM   Geralynn Rile, MD [phone]

## 2018-05-19 NOTE — Progress Notes (Addendum)
PROGRESS NOTE   [name] [name]  ULA:[id number]    DOB: May 11, 1945    DOA: 05/16/2018  PCP: Derinda Late, MD   I have briefly reviewed patients previous medical records in [facility].  Brief Narrative:  73 year old female with history of recent complex hospitalization in March 2122, acute systolic CHF with EF of 48-25%, diagnosed in March 2020 felt to be Takotsubo versus ischemic cardiomyopathy, supposed to have further cardiology follow-up as outpatient, stage III chronic kidney disease with baseline creatinine around 1.7, thrombocytopenia suspected to be ITP, hematology consulted, received IVIG and thrombocytopenia resolved, hospital course was also complicated by pulmonary edema, pneumonia, VDRF, DM 2, CAD and a cardiac cath 9/19 showed severe native CAD and LIMA patent , sequential SVG-PLOM/OM1 with 80% proximal stenosis, s/p DES, SVG to RCA chronically occluded, paroxysmal A. fib used to be on amiodarone but this is on hold due to fear of lung toxicity, hypertension, admitted by orthopedic service 4/14 after a ground-level fall week prior to admission when she presented to orthopedics office for an unstable left ankle fracture, TRH was consulted on 4/16 due to generalized weakness.  She reports ongoing weakness and at times dizziness on standing which has been ongoing for several months now but somewhat worsened prior to this admission.  She denied tinnitus or syncope.  She denied chest pain, palpitations, dyspnea or recent reduced oral intake.   Assessment & Plan:   Active Problems:   Ankle fracture, left   1. Generalized weakness: Not new and suspect somewhat chronic.  Suspect this is what led to her fall and ankle fracture.  Multifactorial due to deconditioning, recent complex hospitalization and has been at SNF since for rehab, acute kidney injury, anemia and multiple comorbidities.  No significant orthostatic changes on blood pressures lying to sitting, as per RN unable to check  standing due to significant weakness.  Clinically appears euvolemic.  Continue rehab at SNF. 2. Chronic systolic CHF/Takotsubo versus ischemic cardiomyopathy: Evaluated by cardiology during last hospitalization.  Clinically euvolemic.  Continue torsemide, Aldactone, metoprolol and losartan.  Closely follow BMP at SNF.  Although chest x-ray was reported to have cardiomegaly and mild pulmonary vascular congestion, this is improved from prior and clinically not in overt CHF.  She had abnormal cardiac cath recently.  Close outpatient follow-up with cardiology.  Continue Plavix. 3. Paroxysmal A. fib: Reportedly off of amiodarone due to concern for lung toxicity.  Continue metoprolol for rate control and apixaban for anticoagulation.  Clinically and EKG on 4/16 show in sinus rhythm. 4. Acute on stage III chronic kidney disease: Baseline creatinine reported to be 1.7.  However most recent value on 3/9 on discharge was 1.39.  Creatinine on admission was 1.98.  This is improved to 1.85.  As stated above, monitor BMP closely and may be twice a week while she is on diuretics and ARB. 5. Type II DM with renal complications: CBGs mildly uncontrolled and fluctuating on current regimen of Lantus and SSI.  At discharge patient will resume prior higher dose of bedtime Lantus and mealtime NovoLog.  Monitor closely and adjust insulins as needed. 6. Hyperlipidemia: Continue statins. 7. Essential hypertension: Controlled.  Continue metoprolol and losartan. 8. GERD: Continue PPI. 9. Left ankle fracture: Status post ORIF by orthopedics.  Orthopedics follow-up appreciated and are discharging patient to SNF today.  She is stable from a medical standpoint for discharge to SNF with close follow-up by MD at SNF. 10. Anemia: She may have had mild postop acute blood loss  anemia.  Hemoglobin dropped from 12.9-10.7.  No overt bleeding noted.  Follow CBC closely at SNF. 11. Morbid obesity/Body mass index is 45.36 kg/m.  12. Chronic  bronchitis/?  COPD: Continue prior home medications including budesonide.  As per SNF medication list, patient was not taking prednisone which shows up on discharge summary.  Discussed with RN to have orthopedics remove prednisone from discharge med list. 13. Anxiety and depression: Stable.  Continue home medications.   DVT prophylaxis: On apixaban Code Status: Full Family Communication: None at bedside Disposition: Patient being discharged by orthopedic/primary service to SNF.   Consultants:  TRH were consultants and will sign off at this time.  Procedures:  ORIF of left ankle fracture.  Antimicrobials:  None.   Subjective: Reports appropriate postop left ankle pain.  Reiterates that she has been having ongoing weakness, intermittent dizziness in upright posture for several months which may have gotten slightly worse prior to admission.  No chest pain, palpitations, dyspnea, cough, fever or chills.  Appetite is good.  As per RN, patient may have been slightly confused earlier this morning which seems to have resolved.  ROS: As above, otherwise negative.  Objective:  Vitals:   05/18/18 2111 05/18/18 2122 05/19/18 0521 05/19/18 0850  BP:  (!) 129/44 130/77   Pulse: 62 (!) 54 61   Resp: 20  18   Temp:  98.2 F (36.8 C) 98 F (36.7 C)   TempSrc:  Oral Oral   SpO2: 98% 93% 95% 98%  Weight:      Height:        Examination:  General exam: Pleasant elderly female, moderately built and obese sitting up comfortably in bed without distress. Respiratory system: Clear to auscultation. Respiratory effort normal. Cardiovascular system: S1 & S2 heard, RRR. No JVD, murmurs, rubs, gallops or clicks. No pedal edema. Gastrointestinal system: Abdomen is nondistended, soft and nontender. No organomegaly or masses felt. Normal bowel sounds heard. Central nervous system: Alert and oriented x3. No focal neurological deficits. Extremities: Symmetric 5 x 5 power.  Left leg/ankle in hard post op  splint. Skin: No rashes, lesions or ulcers Psychiatry: Judgement and insight appear normal. Mood & affect appropriate.     Data Reviewed: I have personally reviewed following labs and imaging studies  CBC: Recent Labs  Lab 05/16/18 0623 05/18/18 1432  WBC 12.2* 13.2*  HGB 12.9 10.7*  HCT 39.0 31.8*  MCV 97.5 97.2  PLT 197 268   Basic Metabolic Panel: Recent Labs  Lab 05/16/18 0623 05/18/18 1432 05/19/18 0121  NA 141 136 137  K 3.0* 4.0 4.1  CL 99 96* 97*  CO2 28 29 33*  GLUCOSE 205* 203* 175*  BUN 19 18 18   CREATININE 1.98* 1.95* 1.85*  CALCIUM 9.7 9.1 9.0   Liver Function Tests: Recent Labs  Lab 05/18/18 1432  AST 21  ALT 22  ALKPHOS 79  BILITOT 1.3*  PROT 5.8*  ALBUMIN 2.9*   Coagulation Profile: Recent Labs  Lab 05/16/18 0623  INR 1.2   CBG: Recent Labs  Lab 05/18/18 1158 05/18/18 1629 05/18/18 2117 05/19/18 0810 05/19/18 1203  GLUCAP 185* 205* 175* 166* 183*    No results found for this or any previous visit (from the past 240 hour(s)).       Radiology Studies: Dg Chest Port 1 View  Result Date: 05/18/2018 CLINICAL DATA:  Weakness in the arms and legs. Chest pressure. Fatigue. History of COPD. EXAM: PORTABLE CHEST 1 VIEW COMPARISON:  04/05/2018 FINDINGS: Sequelae of  CABG are again identified. Central venous catheter has been removed. A CardioMEMS device is unchanged. The cardiac silhouette remains mildly enlarged. There is an improved appearance of the pulmonary interstitium with mild central pulmonary vascular congestion but no overt edema currently. No airspace consolidation, pleural effusion, pneumothorax is identified. No acute osseous abnormality is seen. IMPRESSION: Cardiomegaly and mild pulmonary vascular congestion, improved from prior. Electronically Signed   By: Logan Bores M.D.   On: 05/18/2018 15:11        Scheduled Meds: . ALPRAZolam  0.5 mg Oral BID  . apixaban  5 mg Oral BID  . budesonide  0.25 mg Nebulization BID   . buPROPion  150 mg Oral Daily  . clopidogrel  75 mg Oral QHS  . docusate sodium  100 mg Oral BID  . DULoxetine  30 mg Oral Daily  . ezetimibe  10 mg Oral QHS  . gabapentin  600 mg Oral BID  . insulin aspart  0-15 Units Subcutaneous TID WC  . insulin aspart  0-5 Units Subcutaneous QHS  . insulin glargine  17 Units Subcutaneous QHS  . losartan  12.5 mg Oral Daily  . metoprolol succinate  50 mg Oral QPC breakfast  . pantoprazole  40 mg Oral Daily  . potassium chloride  20 mEq Oral BID  . rosuvastatin  40 mg Oral QHS  . spironolactone  25 mg Oral Daily  . torsemide  20 mg Oral Daily  . traMADol  50 mg Oral Q6H  . vitamin B-12  1,000 mcg Oral Daily   Continuous Infusions:   LOS: 3 days     Vernell Leep, MD, FACP, [facility]. [facility]  To contact the attending provider between 7A-7P or the covering provider during after hours 7P-7A, please log into the web site [email or web address] and access using universal [place] password for that web site. If you do not have the password, please call the hospital operator.  05/19/2018, 1:01 PM

## 2018-05-19 NOTE — Progress Notes (Signed)
Physical Therapy Treatment Patient Details Name: [name] [name] [name]: [id number] DOB: 30-Jan-1946 Today's Date: 05/19/2018    History of Present Illness Pt is a 73 y.o. female recently d/c home from SNF, now admitted 05/16/18 after falling at home sustaining L ankle fx. S/p L ankle ORIF 4/14. PMH includes OA, strokes, CKD, PVD, DM2, CHF, anxiety.    PT Comments    Pt performed standing trials x 2 with decreased assistance and improved endurance.  Progressed to WC mobility to improve endurance.  Pt able to manage WC with min assistance and able to lock and unlock brakes on WC with supervision.  Plan for SNF remains appropriate.  Orthostatic vitals: Lying: 126/51 Sitting:118/57 Standing:126/71    Follow Up Recommendations  SNF;Supervision for mobility/OOB     Equipment Recommendations  (TBD)    Recommendations for Other Services       Precautions / Restrictions Precautions Precautions: Fall Restrictions Weight Bearing Restrictions: Yes LLE Weight Bearing: Non weight bearing    Mobility  Bed Mobility Overal bed mobility: Needs Assistance       Supine to sit: Mod assist     General bed mobility comments: Required assistance for LE advancement and to elevate trunk into sitting.  Heavy reliance on use of railing.    Transfers Overall transfer level: Needs assistance Equipment used: Rolling walker (2 wheeled) Transfers: Sit to/from [facility] Sit to Stand: Mod assist;+2 safety/equipment Stand pivot transfers: Mod assist;+2 safety/equipment       General transfer comment: Pt able to hold to RW and with face to face support with gt belt PTA lifting patient into standing.  Pt able to tolerate two standing trials of 30-45 sec each.    Ambulation/Gait Ambulation/Gait assistance: (NT, able to pivot from bed to WC and WC to recliner but no true steps.  )               Theme park manager  mobility: Yes Wheelchair propulsion: Both upper extremities;Right lower extremity Wheelchair parts: Needs assistance Distance: 87 ft x2 Wheelchair Assistance Details (indicate cue type and reason): min assistance.  Cues for turns, backing and forward propulsion  Modified Rankin (Stroke Patients Only)       Balance Overall balance assessment: Needs assistance   Sitting balance-Leahy Scale: Poor Sitting balance - Comments: Posterior bias with heavy reliance on UE support.       Standing balance-Leahy Scale: Poor Standing balance comment: improved standing tolerance heavy reliance on UE support.  Able to stand upright.                             Cognition Arousal/Alertness: Awake/alert Behavior During Therapy: WFL for tasks assessed/performed Overall Cognitive Status: Within Functional Limits for tasks assessed                                        Exercises      General Comments        Pertinent Vitals/Pain Pain Assessment: Faces Faces Pain Scale: Hurts little more Pain Location: LLE Pain Descriptors / Indicators: Discomfort;Guarding Pain Intervention(s): Monitored during session;Repositioned    Home Living                      Prior Function  PT Goals (current goals can now be found in the care plan section) Acute Rehab PT Goals Patient Stated Goal: Rehab at [facility] [place] [facility] before return home Potential to Achieve Goals: Good Progress towards PT goals: Progressing toward goals    Frequency    Min 3X/week      PT Plan Current plan remains appropriate    Co-evaluation              AM-PAC PT "6 Clicks" Mobility   Outcome Measure  Help needed turning from your back to your side while in a flat bed without using bedrails?: A Lot Help needed moving from lying on your back to sitting on the side of a flat bed without using bedrails?: A Lot Help needed moving to and from a bed to a chair  (including a wheelchair)?: A Lot Help needed standing up from a chair using your arms (e.g., wheelchair or bedside chair)?: A Lot Help needed to walk in hospital room?: A Lot Help needed climbing 3-5 steps with a railing? : Total 6 Click Score: 11    End of Session Equipment Utilized During Treatment: Gait belt Activity Tolerance: Patient tolerated treatment well Patient left: in chair;with call bell/phone within reach;with chair alarm set Nurse Communication: Mobility status;Need for lift equipment(lift pad placed under patient) PT Visit Diagnosis: Other abnormalities of gait and mobility (R26.89);Pain Pain - Right/Left: Left Pain - part of body: Ankle and joints of foot     Time: 1425-1456 PT Time Calculation (min) (ACUTE ONLY): 31 min  Charges:  $Therapeutic Activity: 8-22 mins $Wheel Chair Management: 8-22 mins                     Governor Rooks, PTA Acute Rehabilitation Services Pager [phone] Office [phone]     Pr[place] Cassatt Eli Hose 05/19/2018, 4:25 PM

## 2018-05-19 NOTE — TOC Progression Note (Signed)
Transition of Care [facility]) - Progression Note    Patient Details  Name: [name] [name] [name]: [id number] Date of Birth: 07-03-1945  Transition of Care [facility]) CM/SW [place], [place] Phone Number: 05/19/2018, 8:45 AM  Clinical Narrative:    Pt from [facility], work up completed for return. Pt will need dc summary, signed and printed prescriptions for any pain medications/controlled substances, and dc orders   Expected Discharge Plan: [place] Barriers to Discharge: Continued Medical Work up  Expected Discharge Plan and Services Expected Discharge Plan: [place] In-house Referral: Clinical Social Work Discharge Planning Services: NA Post Acute Care Choice: [place] Living arrangements for the past 2 months: [place], Single Family Home   Social Determinants of Health (SDOH) Interventions    Readmission Risk Interventions Readmission Risk Prevention Plan 05/17/2018  Transportation Screening Complete  PCP or Specialist Appt within 3-5 Days Complete  HRI or Home Care Consult Complete  Social Work Consult for [place] Planning/Counseling Complete  Palliative Care Screening Not Applicable  Medication Review Press photographer) Complete  Some recent data might be hidden

## 2018-05-19 NOTE — Progress Notes (Signed)
[name] to be D/C'd per MD order. Discussed with the patient and all questions fully answered. ? VSS, Skin clean, dry and intact without evidence of skin break down, no evidence of skin tears noted. ? IV catheter discontinued intact. Site without signs and symptoms of complications. Dressing and pressure applied. ? An After Visit Summary was printed and given to the patient. Patient informed where to pickup prescriptions. ? D/c education completed with patient/family including follow up instructions, medication list, d/c activities limitations if indicated, with other d/c instructions as indicated by MD - patient able to verbalize understanding, all questions fully answered.  ? Patient instructed to return to ED, call 911, or call MD for any changes in condition.  ? Patient to be escorted via WC, and D/C home via [place].

## 2018-05-22 ENCOUNTER — Ambulatory Visit (HOSPITAL_COMMUNITY): Payer: Medicare Other

## 2018-05-22 ENCOUNTER — Inpatient Hospital Stay (HOSPITAL_COMMUNITY): Payer: Medicare Other

## 2018-05-24 ENCOUNTER — Ambulatory Visit (HOSPITAL_COMMUNITY): Payer: Medicare Other

## 2018-05-24 ENCOUNTER — Inpatient Hospital Stay: Payer: Medicare Other | Admitting: Nurse Practitioner

## 2018-05-26 ENCOUNTER — Ambulatory Visit (HOSPITAL_COMMUNITY): Payer: Medicare Other

## 2018-05-29 ENCOUNTER — Ambulatory Visit (HOSPITAL_COMMUNITY): Payer: Medicare Other

## 2018-05-31 ENCOUNTER — Telehealth (HOSPITAL_COMMUNITY): Payer: Medicare Other

## 2018-05-31 ENCOUNTER — Ambulatory Visit (HOSPITAL_COMMUNITY): Payer: Medicare Other

## 2018-06-01 ENCOUNTER — Ambulatory Visit (HOSPITAL_COMMUNITY)
Admission: RE | Admit: 2018-06-01 | Discharge: 2018-06-01 | Disposition: A | Discharge status: Home / Self Care | Payer: Medicare Other | Source: Ambulatory Visit | Attending: Internal Medicine | Admitting: Internal Medicine

## 2018-06-01 ENCOUNTER — Other Ambulatory Visit: Payer: Self-pay

## 2018-06-02 ENCOUNTER — Ambulatory Visit (HOSPITAL_COMMUNITY)
Admission: RE | Admit: 2018-06-02 | Discharge: 2018-06-02 | Disposition: A | Discharge status: Home / Self Care | Payer: Medicare Other | Source: Ambulatory Visit | Attending: Internal Medicine | Admitting: Internal Medicine

## 2018-06-02 ENCOUNTER — Other Ambulatory Visit: Payer: Self-pay

## 2018-06-02 DIAGNOSIS — I25119 Atherosclerotic heart disease of native coronary artery with unspecified angina pectoris: Secondary | ICD-10-CM

## 2018-06-02 DIAGNOSIS — I10 Essential (primary) hypertension: Secondary | ICD-10-CM

## 2018-06-02 DIAGNOSIS — I5022 Chronic systolic (congestive) heart failure: Secondary | ICD-10-CM | POA: Diagnosis not present

## 2018-06-02 DIAGNOSIS — I951 Orthostatic hypotension: Secondary | ICD-10-CM

## 2018-06-02 DIAGNOSIS — I480 Paroxysmal atrial fibrillation: Secondary | ICD-10-CM

## 2018-06-02 DIAGNOSIS — G4733 Obstructive sleep apnea (adult) (pediatric): Secondary | ICD-10-CM

## 2018-06-02 MED ORDER — TORSEMIDE 20 MG PO TABS: 20.0000 mg | ORAL_TABLET | ORAL | Status: DC

## 2018-06-02 NOTE — Addendum Note (Signed)
Encounter addended by: Marlise Eves, RN on: 06/02/2018 2:16 PM  Actions taken: Order Reconciliation Section accessed, Home Medications modified, Medication List reviewed, Medication taking status modified, Order list changed

## 2018-06-02 NOTE — Patient Instructions (Signed)
Lab work will need to be done next week. This will be drawn by your facility staff at [facility]  DECREASE to Torsemide 20mg  1 tab every other day.  Please follow up with the Advance Practice Provider in 2-3 weeks via telehealth appointment.

## 2018-06-02 NOTE — Progress Notes (Signed)
Called pt to review avs. No answer. Left voicemail to call back.

## 2018-06-02 NOTE — Progress Notes (Signed)
Heart Failure TeleHealth Note  Due to national recommendations of social distancing due to [place] 19, telehealth visit is felt to be most appropriate for this patient at this time.  I discussed the limitations, risks, security and privacy concerns of performing an evaluation and management service by telephone and the availability of in person appointments. I also discussed with the patient that there may be a patient responsible charge related to this service. The patient expressed understanding and agreed to proceed.   ID:  [name] [name], DOB 1945/12/12, [name] [id number]  Location: Home  Provider location: [address] [place] Type of Visit: Established patient   PCP:  Derinda Late, MD  Cardiologist:  No primary care provider on [name]. Primary HF: Dr [name]  Chief Complaint: HF follow up   History of Present Illness: ALEASE [name] is a 73 y.o. female with a history of CAD s/p CABG, diastolic CHF, and cerebrovascular disease with history of CVA presents for followup of CHF and diastolic CHF. She had CABG x 5 in 12/11. Prior to the CABG she had multiple PCIs. She had a CVA in 2/14 that presented as visual loss. She had a left carotid stent in 2/15. See extensive history in note from 05/15/18.   She was admitted 12/16-12/19/19 for cardiomems implant and RHC. She was diuresed with IV lasix and transitioned to torsemide 100 mg BID + metolazone once/week. DC weight: 295 lbs.  She had a prolonged hospitalization in 2/20 with acute respiratory failure and fever to 103.  She was thought to have PNA and treated with cefepime.  She was intubated.  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  Amiodarone was stopped and she was put on prednisone. Echo in 2/20 showed EF down to 25-30% with mid-apical LV severe hypokinesis. She had AKI. Possible ITP triggered by infection, HIT negative.  ITP was treated with Solumedrol. She was discharged to SNF.  She was sent  home from SNF but fractured her ankle when she fell walking from car to house (mechanical).  S/p ORIF left ankle 05/16/18.  Patient presents via audio conferencing for a telehealth visit today. Overall doing well. She is still at [facility] in [place] for rehab. Had stitches removed from left ankle Wednesday and had re-xray. Ankle and pins all were stable. She had two falls with dizziness when she was trying to get up on her own. Weight continues to drop, now 240 lbs (down 12-15 lbs in 2 weeks). Continues to urinate a lot with torsemide. Has clear/yellow urine. She is dizzy most times she stands up and when she moves side to side. PT is working with her on vertigo exercises. Mild SOB when working PT. No edema, orthopnea, or PND. She is not using oxygen currently. O2 levels have been stable, even at night. No CP. BP has dropped when she stands up, but not every time. BP generally 110-140s at SNF. Unable to bring in cardiomems pillow due to COVID restrictions. Meds through SNF. She is unable to verify today. Meals through SNF. She is eating less.   Pt denies symptoms of cough, fevers, chills, or new SOB worrisome for COVID 19.    Past Medical History:  Diagnosis Date   Abnormality of gait 05/14/2014   Anemia    hx   Anginal pain (HCC)    Anxiety    Asthma    Basal cell carcinoma 05/2014   "left shoulder"   Bundle branch  block, left    chronic/notes 07/18/2013   CHF (congestive heart failure) ([place])    Chronic bronchitis ([place])    "off and on all the time" (07/18/2013)   Chronic insomnia 05/06/2015   Chronic kidney disease    frequency, sees dr Jamal Maes every 4 to 6 months (01/16/2018)   Chronic low back pain 08/24/2016   Chronic lower back pain    Claustrophobia    Common migraine 05/14/2014   Coronary artery disease    Depression    Dysrhythmia    GERD (gastroesophageal reflux disease)    H/O hiatal hernia    Headache    "at least 2/month" (01/16/2018)     Heart murmur    Hyperlipidemia    Hypertension    Irregular heart beat    Leg cramps    both legs at times   Melanoma ([place]) 05/2014   "burned off BLE" (01/16/2018)   Memory change 05/14/2014   Migraine    "5-6/year"  (01/16/2018)   Myocardial infarction ([place]) 04/1999, 02/2000, 01/2005; 2011; 2014   Neuromuscular disorder ([place])    ?   Obesity 01-2010   Obstructive sleep apnea    "can't wear machine; I have claustrophobia" (01/16/2018), states she had a 2nd sleep study and does not have sleep apnea, her O2 decreases and now is on 2 L of O2 at night.   On home oxygen therapy    "2L at night and prn during daytime" (01/16/2018)   Osteoarthritis    "knees and hands" (01/16/2018)   Other and unspecified angina pectoris    Peripheral vascular disease ([place])    ? numbness, tingling arms and legs   Pneumonia 2000's   "once"   PONV (postoperative nausea and vomiting)    Shortness of breath    with exertion   Stroke ([place]) 03/22/12   right side brain; denies residual on 07/18/2013)   Stroke [facility]) Oct. 2015   Affected pt.s balance   Stroke ([place]) 10/2014   "affected my legs; fully recovered now"; still have sporatic memory issues from this one" (01/16/2018)   Type II diabetes mellitus (HCC)    Ventral hernia    hx of   Vomiting    persistent   Vomiting blood    Past Surgical History:  Procedure Laterality Date   ANKLE FRACTURE SURGERY Left 1970's   APPENDECTOMY  1970's   w/hysterectomy   BASAL CELL CARCINOMA EXCISION Left 05/2014   "shoulder" (01/16/2018)   CARDIAC CATHETERIZATION  10/10/2012   Dr [name].   CARDIAC CATHETERIZATION N/A 05/29/2015   Procedure: Right/Left Heart Cath and Coronary/Graft Angiography;  Surgeon: Larey Dresser, MD;  Location: [place] CV LAB;  Service: Cardiovascular;  Laterality: N/A;   CAROTID ENDARTERECTOMY Left 03/2013   CAROTID STENT INSERTION Left 03/20/2013   Procedure: CAROTID STENT INSERTION;  Surgeon: [name], MD;  Location: [facility] CATH LAB;  Service: Cardiovascular;  Laterality: Left;  internal carotid   CEREBRAL ANGIOGRAM N/A 04/05/2011   Procedure: CEREBRAL ANGIOGRAM;  Surgeon: Angelia Mould, MD;  Location: [facility] CATH LAB;  Service: Cardiovascular;  Laterality: N/A;   CHOLECYSTECTOMY OPEN  2004   CORONARY ANGIOPLASTY WITH STENT PLACEMENT  01,02,05,06,07,08,11; 04/24/2013   "I've probably got ~ 10 stents by now" (04/24/2013)   CORONARY ANGIOPLASTY WITH STENT PLACEMENT  06/13/2013   "got 4 stents today" (06/13/2013)   CORONARY ARTERY BYPASS GRAFT  1220/11   "CABG X5"   CORONARY STENT INTERVENTION N/A 10/20/2017   Procedure: CORONARY STENT  INTERVENTION;  Surgeon: Troy Sine, MD;  Location: [place] CV LAB;  Service: Cardiovascular;  Laterality: N/A;   ESOPHAGOGASTRODUODENOSCOPY  08/03/2011   Procedure: ESOPHAGOGASTRODUODENOSCOPY (EGD);  Surgeon: Shann Medal, MD;  Location: [facility];  Service: General;  Laterality: N/A;   ESOPHAGOGASTRODUODENOSCOPY (EGD) WITH PROPOFOL N/A 03/11/2014   Procedure: ESOPHAGOGASTRODUODENOSCOPY (EGD) WITH PROPOFOL;  Surgeon: Lafayette Dragon, MD;  Location: [facility] ENDOSCOPY;  Service: Endoscopy;  Laterality: N/A;   FRACTURE SURGERY     gall stone removal  05/2003   GASTRIC RESTRICTION SURGERY  1984   "stapeling"   HERNIA REPAIR  2004   "in my stomach; had OR on it twice", wire mesh on 1 hernia   LEFT HEART CATH AND CORS/GRAFTS ANGIOGRAPHY N/A 07/09/2016   Procedure: LEFT HEART CATH AND CORS/GRAFTS ANGIOGRAPHY;  Surgeon: Larey Dresser, MD;  Location: [place] CV LAB;  Service: Cardiovascular;  Laterality: N/A;   ORIF ANKLE FRACTURE Left 05/16/2018   ORIF ANKLE FRACTURE Left 05/16/2018   Procedure: OPEN REDUCTION INTERNAL FIXATION (ORIF) Left ankle with possible syndesmosis fixation;  Surgeon: Nicholes Stairs, MD;  Location: [place];  Service: Orthopedics;  Laterality: Left;  145mn   OVARY SURGERY  1970's   "tumor removed"   PERCUTANEOUS  CORONARY STENT INTERVENTION (PCI-S) N/A 06/13/2013   Procedure: PERCUTANEOUS CORONARY STENT INTERVENTION (PCI-S);  Surgeon: JJettie Booze MD;  [facility];  Service: Cardiovascular;  Laterality: N/A;   PERCUTANEOUS STENT INTERVENTION N/A 04/24/2013   Procedure: PERCUTANEOUS STENT INTERVENTION;  Surgeon: JJettie Booze MD;  [facility];  Service: Cardiovascular;  Laterality: N/A;   RIGHT/LEFT HEART CATH AND CORONARY/GRAFT ANGIOGRAPHY N/A 10/20/2017   Procedure: RIGHT/LEFT HEART CATH AND CORONARY/GRAFT ANGIOGRAPHY;  Surgeon: MLarey Dresser MD;  [place];  Service: Cardiovascular;  Laterality: N/A;   ROOT CANAL  10/2000   TIBIA FRACTURE SURGERY Right 1970's   rods and pins   TOOTH EXTRACTION     "1 on the upper; wisdom tooth on the lower" (01/16/2018)   TOTAL ABDOMINAL HYSTERECTOMY  1970's   w/ appendectomy     Current Outpatient Medications  Medication Sig Dispense Refill   ALPRAZolam (XANAX) 0.5 MG tablet Take 1 tablet (0.5 mg total) by mouth 2 (two) times daily. 60 tablet 0   apixaban (ELIQUIS) 5 MG TABS tablet Take 1 tablet (5 mg total) by mouth 2 (two) times daily. 60 tablet 6   bisacodyl (DULCOLAX) 5 MG EC tablet Take 1 tablet (5 mg total) by mouth daily as needed for moderate constipation. 30 tablet 1   budesonide (PULMICORT) 0.25 MG/2ML nebulizer solution Take 2 mLs (0.25 mg total) by nebulization 2 (two) times daily. 60 mL 12   buPROPion (WELLBUTRIN XL) 150 MG 24 hr tablet Take 150 mg by mouth daily.     clopidogrel (PLAVIX) 75 MG tablet Take 1 tablet (75 mg total) by mouth at bedtime. 90 tablet 1   DULoxetine (CYMBALTA) 30 MG capsule Take 1 capsule (30 mg total) by mouth daily.  3   ezetimibe (ZETIA) 10 MG tablet Take 1 tablet (10 mg total) by mouth at bedtime.     gabapentin (NEURONTIN) 300 MG capsule Take 2 capsules (600 mg total) by mouth 2 (two) times daily.     insulin aspart (NOVOLOG) 100 UNIT/ML injection  Inject 8 Units into the skin 3 (three) times daily with meals. 10 mL 11   insulin glargine (LANTUS) 100 UNIT/ML injection Inject 0.35 mLs (35 Units  total) into the skin at bedtime. 10 mL 11   ipratropium-albuterol (DUONEB) 0.5-2.5 (3) MG/3ML SOLN Take 3 mLs by nebulization 2 (two) times daily. (Patient taking differently: Take 3 mLs by nebulization 2 (two) times daily as needed (shortness of breath). ) 360 mL    losartan (COZAAR) 25 MG tablet Take 0.5 tablets (12.5 mg total) by mouth daily. (Patient taking differently: Take 12.5 mg by mouth at bedtime. )     metoprolol succinate (TOPROL-XL) 50 MG 24 hr tablet Take 1 tablet (50 mg total) by mouth daily. Take with or immediately following a meal.     Multiple Vitamins-Minerals (HEALTHY EYES/LUTEIN) TABS Take 1 tablet by mouth daily.     nitroGLYCERIN (NITROSTAT) 0.3 MG SL tablet Place 1 tablet (0.3 mg total) under the tongue every 5 (five) minutes x 3 doses as needed for chest pain. 9 tablet 1   oxyCODONE (OXY IR/ROXICODONE) 5 MG immediate release tablet Take 1 tablet (5 mg total) by mouth every 4 (four) hours as needed for moderate pain or severe pain. 40 tablet 0   OXYGEN Inhale 2 L into the lungs at bedtime.      pantoprazole (PROTONIX) 40 MG tablet Take 1 tablet (40 mg total) by mouth daily.     rosuvastatin (CRESTOR) 40 MG tablet Take 1 tablet (40 mg total) by mouth at bedtime.     spironolactone (ALDACTONE) 25 MG tablet Take 1 tablet (25 mg total) by mouth daily.     torsemide (DEMADEX) 20 MG tablet Take 1 tablet (20 mg total) by mouth daily.     traMADol (ULTRAM) 50 MG tablet Take 1 tablet (50 mg total) by mouth every 6 (six) hours as needed for moderate pain. 40 tablet 0   vitamin B-12 (CYANOCOBALAMIN) 1000 MCG tablet Take 1,000 mcg by mouth daily.     No current facility-administered medications for this encounter.     Allergies:   Amoxicillin; Brilinta [ticagrelor]; Erythromycin; Flagyl [metronidazole]; Penicillins;  Isosorbide mononitrate [isosorbide nitrate]; Jardiance [empagliflozin]; Metformin and related; Tape; and Erythromycin base   Social History:  The patient  reports that she has never smoked. She has never used smokeless tobacco. She reports that she does not drink alcohol or use drugs.   Family History:  The patient's family history includes AAA (abdominal aortic aneurysm) in her father and mother; Cancer in her sister; Deep vein thrombosis in her father and mother; Diabetes in her father, paternal aunt, paternal grandmother, paternal uncle, and paternal uncle; Heart attack in her father; Heart attack (age of onset: 36) in her paternal grandfather; Heart disease in her father and paternal uncle; Hyperlipidemia in her father; Hypertension in her father, mother, and sister.   ROS:  Please see the history of present illness.   All other systems are personally reviewed and negative.    Exam:  (Video/Tele Health Call; Exam is subjective and or/visual.) General:  Speaks in full sentences. No resp difficulty. Lungs: Normal respiratory effort with conversation.  Abdomen: No distension per patient report Extremities: Pt denies edema. Neuro: Alert & oriented x 3.   Recent Labs: 01/16/2018: TSH 2.977 04/10/2018: B Natriuretic Peptide 149.9; Magnesium 2.1 05/18/2018: ALT 22; Hemoglobin 10.7; Platelets 175 05/19/2018: BUN 18; Creatinine, Ser 1.85; Potassium 4.1; Sodium 137  Personally reviewed   Wt Readings from Last 3 Encounters:  05/16/18 114.3 kg (252 lb)  05/04/18 113.4 kg (250 lb)  04/10/18 121.7 kg (268 lb 4.8 oz)      ASSESSMENT AND PLAN:  1. CAD: Occluded  native RCA and SVG-RCA. Status post opening of CTO RCA with 4 overlapping Xience DES in 5/15. DES to sequential SVG-OM1/PLOM in 9/19.  No chest pain, but her EF on echo done in 3/20 was down to 25-30%.  Not sure if this is due to CAD or is a stress (Takotsubo-type) cardiomyopathy in the setting of severe pulmonary disease with intubation.     - Continue Plavix hopefully for a year (10/2018).  - She has not tolerated Imdur in the past. Continue Toprol XL.  She is off ranolazine but has not had any chest pain.  -No recent CP.  2. Chronic diastolic => systolic CHF: Echo in 4/49 with EF 55-60%, moderate diastolic dysfunction.Cardiomems placed 01/16/18.  Echo in 3/20 in setting of severe PNA with intubation showed EF 25-30%, mid-apical LV severe hypokinesis.  Possible stress (Takotsubo-type) cardiomyopathy related to severe medical illness versus progression of CAD.  She has baseline chronic LBBB which could play a role in cardiomyopathy as well (LBBB cardiomyopathy).   - NYHA class II symptoms but not very active due to fractured ankle. She is on considerably less torsemide than in the past but weight is down 12-15 more lbs in 2 weeks and she has had a couple falls due to dizziness. Unable to check Cardiomems (does not have pillow at SNF and cannot bring in with COVID restrictions).  - Decrease torsemide to 20 mg QOD - Continue spironolactone 25 mg daily - Continue Toprol XL 50 mg daily.  - Continue losartan 12.5 mg daily. Creatinine up to 1.85 on labs 4/13. Will hold off on transitioning to [facility] with falls/orthostasis and creatinine elevation. - Eventually would favor cardiac cath to assess for progressive CAD, but holding off elective studies currently due to [place] pandemic.  - She will need eventual repeat echo to see if EF remains low.  If no progression of CAD and EF remains low, she will be a CRT-D candidate with LBBB.   3. Hyperlipidemia: She remains on Zetia, Crestor and Praluent.Lipids mildly higher than goal in 9/19 with LDL 108, she is on maximal meds at this time.No change.  4. Hypertension -> orthostatic hypotension -SBP 110-140s, but having some orthostasis. Decrease torsemide as above.   5. Cerebrovascular disease: She has history of CVA and had left carotid stent in 2/15. Followed by [facility]. Most recent MRA head  showed severe stenosis of supraclinoid RICA and severe basilar artery stenosis. No change.  6. OHS/OSA: Cannot tolerate CPAP. Does not currently need O2.   7. Atrial fibrillation: Paroxysmal, no recent palpitations. 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  - She has completed course of prednisone for amiodarone lung toxicity.   - Continue Apixiban 5 mg BID. Ddnies bleeding.  8. Ankle fracture: S/p ORIF on 05/16/18. - 4 more weeks until she can bear weight. Still at SNF.  9. Falls - Having dizziness and falls. Sounds like there is a vertigo component, but weight is also down 15 more lbs in 2 weeks and having orthostasis. PT is doing vertigo exercises with her. Decrease torsemide as above.   10. CKD III-IV - baseline creatinine PTA seemed to be 1.4-1.6 - Last creatinine 1.85 on 4/17. Check BMET next week at SNF.   COVID screen The patient does not have any symptoms that suggest any further testing/ screening at this time.  Social distancing reinforced today.  Patient Risk: After full review of this patients clinical status, I feel that they are at moderate risk for cardiac  decompensation at this time.  Orders/Follow up: Decrease torsemide to 20 mg QOD. Check BMET and BNP next week at SNF. Follow up in 2-3 weeks. Will call SNF to verify med list and update on changes.   Today, I have spent 17 minutes with the patient with telehealth technology discussing the above issues.    Signed, Georgiana Shore, NP  06/02/2018 1:27 PM   [facility] [address] [facility] and [place] [address] [phone] (office) [phone] (fax)

## 2018-06-02 NOTE — Addendum Note (Signed)
Encounter addended by: Marlise Eves, RN on: 06/02/2018 2:37 PM  Actions taken: Clinical Note Signed

## 2018-07-10 ENCOUNTER — Telehealth (HOSPITAL_COMMUNITY): Payer: Medicare Other | Admitting: Cardiology

## 2018-07-31 ENCOUNTER — Other Ambulatory Visit: Payer: Self-pay | Admitting: Family Medicine

## 2018-07-31 ENCOUNTER — Ambulatory Visit
Admission: RE | Admit: 2018-07-31 | Discharge: 2018-07-31 | Disposition: A | Discharge status: Home / Self Care | Payer: Medicare Other | Source: Ambulatory Visit | Attending: Family Medicine | Admitting: Family Medicine

## 2018-07-31 DIAGNOSIS — W19XXXS Unspecified fall, sequela: Secondary | ICD-10-CM

## 2018-07-31 DIAGNOSIS — R52 Pain, unspecified: Secondary | ICD-10-CM

## 2018-08-17 ENCOUNTER — Telehealth (HOSPITAL_COMMUNITY): Payer: Self-pay

## 2018-08-17 NOTE — Telephone Encounter (Signed)
Relayed results to pt, verbalized understanding of med changes. Will have repeat labs done with PCP & have faxed to [facility]

## 2018-08-17 NOTE — Telephone Encounter (Signed)
-----   Message from Larey Dresser, MD sent at 08/16/2018  4:38 PM EDT ----- With higher creatinine, stop losartan.  Make sure she is taking torsemide 20 mg every other day. BMET 10 days.

## 2018-08-18 ENCOUNTER — Telehealth: Payer: Self-pay | Admitting: Pharmacist

## 2018-08-18 MED ORDER — PRALUENT 150 MG/ML ~~LOC~~ SOAJ: 1.0000 "pen " | SUBCUTANEOUS | 11 refills | Status: DC

## 2018-08-18 NOTE — Telephone Encounter (Signed)
Called pt to follow up with lipids as Praluent is no longer on her med list. Pt states she was in the hospital earlier this year and then in SNF and just came home a few weeks ago. She has not been on Praluent since hospital discharge but wishes to resume. Prior auth sent and approved until 02/01/19. Copay at pharmacy is $95. Spoke with pt, applied for [employer], pt approved for $2500 [place]. Copay at pharmacy now $0, pt is aware and will call with concerns.

## 2018-08-29 ENCOUNTER — Telehealth (HOSPITAL_COMMUNITY): Payer: Self-pay | Admitting: *Deleted

## 2018-08-29 MED ORDER — METOPROLOL SUCCINATE ER 50 MG PO TB24: ORAL_TABLET | ORAL | Status: DC

## 2018-08-29 NOTE — Telephone Encounter (Signed)
Lattie Haw with [facility] called to report pts heart rate in the 40s pt is asymptomatic. Per dr.McLean decrease metoprolol to 25mg  daily. Pt and RN aware.

## 2018-09-01 ENCOUNTER — Telehealth (HOSPITAL_COMMUNITY): Payer: Self-pay

## 2018-09-01 DIAGNOSIS — R42 Dizziness and giddiness: Secondary | ICD-10-CM

## 2018-09-01 NOTE — Telephone Encounter (Signed)
Received call from home health PT stating that the pt has been feeling dizzy and having heart rates in the 30's despite lowering dose of metoprolol. Pt lost balance and fell but denies any injury at all. Per Dr. [name], STOP Metoprolol and wear zio patch for 3 days.

## 2018-09-08 NOTE — Telephone Encounter (Signed)
HHRN Lisa [phone]) called stating pts bp was 154/75 heart rate 73.  She asked if we would like to restart any medications since.    Routed to [facility] for advice

## 2018-09-08 NOTE — Telephone Encounter (Signed)
She can start on amlodipine 5 mg daily for BP.  We have taken her off losartan with elevated creatinine and decreased Toprol XL with bradycardia.

## 2018-09-09 ENCOUNTER — Emergency Department (HOSPITAL_COMMUNITY): Payer: Medicare Other

## 2018-09-09 ENCOUNTER — Encounter (HOSPITAL_COMMUNITY): Payer: Self-pay | Admitting: Emergency Medicine

## 2018-09-09 ENCOUNTER — Other Ambulatory Visit: Payer: Self-pay

## 2018-09-09 ENCOUNTER — Inpatient Hospital Stay (HOSPITAL_COMMUNITY)
Admission: EM | Admit: 2018-09-09 | Discharge: 2018-09-13 | DRG: 871 | Disposition: A | Discharge status: Rehabilitation Facility | Payer: Medicare Other | Attending: Internal Medicine | Admitting: Internal Medicine

## 2018-09-09 ENCOUNTER — Telehealth: Payer: Self-pay | Admitting: Student

## 2018-09-09 DIAGNOSIS — I951 Orthostatic hypotension: Secondary | ICD-10-CM | POA: Diagnosis not present

## 2018-09-09 DIAGNOSIS — Z6841 Body Mass Index (BMI) 40.0 and over, adult: Secondary | ICD-10-CM | POA: Diagnosis not present

## 2018-09-09 DIAGNOSIS — A4102 Sepsis due to Methicillin resistant Staphylococcus aureus: Principal | ICD-10-CM | POA: Diagnosis present

## 2018-09-09 DIAGNOSIS — F329 Major depressive disorder, single episode, unspecified: Secondary | ICD-10-CM | POA: Diagnosis present

## 2018-09-09 DIAGNOSIS — G4733 Obstructive sleep apnea (adult) (pediatric): Secondary | ICD-10-CM | POA: Diagnosis present

## 2018-09-09 DIAGNOSIS — I447 Left bundle-branch block, unspecified: Secondary | ICD-10-CM | POA: Diagnosis present

## 2018-09-09 DIAGNOSIS — J9601 Acute respiratory failure with hypoxia: Secondary | ICD-10-CM | POA: Diagnosis present

## 2018-09-09 DIAGNOSIS — E1151 Type 2 diabetes mellitus with diabetic peripheral angiopathy without gangrene: Secondary | ICD-10-CM | POA: Diagnosis present

## 2018-09-09 DIAGNOSIS — J45909 Unspecified asthma, uncomplicated: Secondary | ICD-10-CM | POA: Diagnosis present

## 2018-09-09 DIAGNOSIS — I5032 Chronic diastolic (congestive) heart failure: Secondary | ICD-10-CM | POA: Diagnosis not present

## 2018-09-09 DIAGNOSIS — R29810 Facial weakness: Secondary | ICD-10-CM | POA: Diagnosis present

## 2018-09-09 DIAGNOSIS — I6622 Occlusion and stenosis of left posterior cerebral artery: Secondary | ICD-10-CM | POA: Diagnosis present

## 2018-09-09 DIAGNOSIS — R059 Cough, unspecified: Secondary | ICD-10-CM

## 2018-09-09 DIAGNOSIS — Z95828 Presence of other vascular implants and grafts: Secondary | ICD-10-CM

## 2018-09-09 DIAGNOSIS — S20222A Contusion of left back wall of thorax, initial encounter: Secondary | ICD-10-CM | POA: Diagnosis present

## 2018-09-09 DIAGNOSIS — J42 Unspecified chronic bronchitis: Secondary | ICD-10-CM | POA: Diagnosis present

## 2018-09-09 DIAGNOSIS — Z7951 Long term (current) use of inhaled steroids: Secondary | ICD-10-CM

## 2018-09-09 DIAGNOSIS — N183 Chronic kidney disease, stage 3 unspecified: Secondary | ICD-10-CM | POA: Diagnosis present

## 2018-09-09 DIAGNOSIS — J180 Bronchopneumonia, unspecified organism: Secondary | ICD-10-CM | POA: Diagnosis present

## 2018-09-09 DIAGNOSIS — Z9981 Dependence on supplemental oxygen: Secondary | ICD-10-CM | POA: Diagnosis not present

## 2018-09-09 DIAGNOSIS — I25810 Atherosclerosis of coronary artery bypass graft(s) without angina pectoris: Secondary | ICD-10-CM | POA: Diagnosis present

## 2018-09-09 DIAGNOSIS — I5043 Acute on chronic combined systolic (congestive) and diastolic (congestive) heart failure: Secondary | ICD-10-CM | POA: Diagnosis present

## 2018-09-09 DIAGNOSIS — F418 Other specified anxiety disorders: Secondary | ICD-10-CM | POA: Diagnosis not present

## 2018-09-09 DIAGNOSIS — L409 Psoriasis, unspecified: Secondary | ICD-10-CM | POA: Diagnosis present

## 2018-09-09 DIAGNOSIS — I5022 Chronic systolic (congestive) heart failure: Secondary | ICD-10-CM

## 2018-09-09 DIAGNOSIS — I700 Atherosclerosis of aorta: Secondary | ICD-10-CM | POA: Diagnosis present

## 2018-09-09 DIAGNOSIS — R4781 Slurred speech: Secondary | ICD-10-CM | POA: Diagnosis present

## 2018-09-09 DIAGNOSIS — E876 Hypokalemia: Secondary | ICD-10-CM | POA: Diagnosis present

## 2018-09-09 DIAGNOSIS — S8012XA Contusion of left lower leg, initial encounter: Secondary | ICD-10-CM | POA: Diagnosis present

## 2018-09-09 DIAGNOSIS — Z794 Long term (current) use of insulin: Secondary | ICD-10-CM | POA: Diagnosis not present

## 2018-09-09 DIAGNOSIS — E1122 Type 2 diabetes mellitus with diabetic chronic kidney disease: Secondary | ICD-10-CM | POA: Diagnosis present

## 2018-09-09 DIAGNOSIS — W19XXXA Unspecified fall, initial encounter: Secondary | ICD-10-CM | POA: Diagnosis present

## 2018-09-09 DIAGNOSIS — R509 Fever, unspecified: Secondary | ICD-10-CM

## 2018-09-09 DIAGNOSIS — R339 Retention of urine, unspecified: Secondary | ICD-10-CM | POA: Diagnosis present

## 2018-09-09 DIAGNOSIS — K5900 Constipation, unspecified: Secondary | ICD-10-CM | POA: Diagnosis present

## 2018-09-09 DIAGNOSIS — G8929 Other chronic pain: Secondary | ICD-10-CM | POA: Diagnosis present

## 2018-09-09 DIAGNOSIS — Z833 Family history of diabetes mellitus: Secondary | ICD-10-CM

## 2018-09-09 DIAGNOSIS — Z951 Presence of aortocoronary bypass graft: Secondary | ICD-10-CM

## 2018-09-09 DIAGNOSIS — M545 Low back pain: Secondary | ICD-10-CM | POA: Diagnosis present

## 2018-09-09 DIAGNOSIS — J15212 Pneumonia due to Methicillin resistant Staphylococcus aureus: Secondary | ICD-10-CM | POA: Diagnosis present

## 2018-09-09 DIAGNOSIS — A419 Sepsis, unspecified organism: Secondary | ICD-10-CM | POA: Diagnosis not present

## 2018-09-09 DIAGNOSIS — Z79899 Other long term (current) drug therapy: Secondary | ICD-10-CM

## 2018-09-09 DIAGNOSIS — I959 Hypotension, unspecified: Secondary | ICD-10-CM | POA: Diagnosis not present

## 2018-09-09 DIAGNOSIS — I5031 Acute diastolic (congestive) heart failure: Secondary | ICD-10-CM | POA: Diagnosis not present

## 2018-09-09 DIAGNOSIS — K219 Gastro-esophageal reflux disease without esophagitis: Secondary | ICD-10-CM | POA: Diagnosis present

## 2018-09-09 DIAGNOSIS — Z20828 Contact with and (suspected) exposure to other viral communicable diseases: Secondary | ICD-10-CM | POA: Diagnosis present

## 2018-09-09 DIAGNOSIS — I10 Essential (primary) hypertension: Secondary | ICD-10-CM | POA: Diagnosis not present

## 2018-09-09 DIAGNOSIS — I5042 Chronic combined systolic (congestive) and diastolic (congestive) heart failure: Secondary | ICD-10-CM | POA: Diagnosis present

## 2018-09-09 DIAGNOSIS — I5021 Acute systolic (congestive) heart failure: Secondary | ICD-10-CM | POA: Diagnosis not present

## 2018-09-09 DIAGNOSIS — I130 Hypertensive heart and chronic kidney disease with heart failure and stage 1 through stage 4 chronic kidney disease, or unspecified chronic kidney disease: Secondary | ICD-10-CM | POA: Diagnosis present

## 2018-09-09 DIAGNOSIS — J181 Lobar pneumonia, unspecified organism: Secondary | ICD-10-CM | POA: Diagnosis present

## 2018-09-09 DIAGNOSIS — I639 Cerebral infarction, unspecified: Secondary | ICD-10-CM | POA: Diagnosis not present

## 2018-09-09 DIAGNOSIS — Z7901 Long term (current) use of anticoagulants: Secondary | ICD-10-CM

## 2018-09-09 DIAGNOSIS — G43009 Migraine without aura, not intractable, without status migrainosus: Secondary | ICD-10-CM | POA: Diagnosis present

## 2018-09-09 DIAGNOSIS — I340 Nonrheumatic mitral (valve) insufficiency: Secondary | ICD-10-CM | POA: Diagnosis not present

## 2018-09-09 DIAGNOSIS — H53149 Visual discomfort, unspecified: Secondary | ICD-10-CM | POA: Diagnosis present

## 2018-09-09 DIAGNOSIS — E1129 Type 2 diabetes mellitus with other diabetic kidney complication: Secondary | ICD-10-CM | POA: Diagnosis present

## 2018-09-09 DIAGNOSIS — F40240 Claustrophobia: Secondary | ICD-10-CM | POA: Diagnosis present

## 2018-09-09 DIAGNOSIS — Z888 Allergy status to other drugs, medicaments and biological substances status: Secondary | ICD-10-CM

## 2018-09-09 DIAGNOSIS — S301XXA Contusion of abdominal wall, initial encounter: Secondary | ICD-10-CM | POA: Diagnosis present

## 2018-09-09 DIAGNOSIS — I25700 Atherosclerosis of coronary artery bypass graft(s), unspecified, with unstable angina pectoris: Secondary | ICD-10-CM

## 2018-09-09 DIAGNOSIS — Z8673 Personal history of transient ischemic attack (TIA), and cerebral infarction without residual deficits: Secondary | ICD-10-CM | POA: Diagnosis present

## 2018-09-09 DIAGNOSIS — R296 Repeated falls: Secondary | ICD-10-CM | POA: Diagnosis present

## 2018-09-09 DIAGNOSIS — I361 Nonrheumatic tricuspid (valve) insufficiency: Secondary | ICD-10-CM | POA: Diagnosis not present

## 2018-09-09 DIAGNOSIS — Z955 Presence of coronary angioplasty implant and graft: Secondary | ICD-10-CM

## 2018-09-09 DIAGNOSIS — R05 Cough: Secondary | ICD-10-CM

## 2018-09-09 DIAGNOSIS — E785 Hyperlipidemia, unspecified: Secondary | ICD-10-CM | POA: Diagnosis present

## 2018-09-09 DIAGNOSIS — Z91048 Other nonmedicinal substance allergy status: Secondary | ICD-10-CM

## 2018-09-09 DIAGNOSIS — R6520 Severe sepsis without septic shock: Secondary | ICD-10-CM | POA: Diagnosis present

## 2018-09-09 DIAGNOSIS — I25119 Atherosclerotic heart disease of native coronary artery with unspecified angina pectoris: Secondary | ICD-10-CM | POA: Diagnosis present

## 2018-09-09 DIAGNOSIS — E669 Obesity, unspecified: Secondary | ICD-10-CM | POA: Diagnosis present

## 2018-09-09 DIAGNOSIS — Z973 Presence of spectacles and contact lenses: Secondary | ICD-10-CM

## 2018-09-09 DIAGNOSIS — I252 Old myocardial infarction: Secondary | ICD-10-CM

## 2018-09-09 DIAGNOSIS — I480 Paroxysmal atrial fibrillation: Secondary | ICD-10-CM | POA: Diagnosis present

## 2018-09-09 DIAGNOSIS — Z881 Allergy status to other antibiotic agents status: Secondary | ICD-10-CM

## 2018-09-09 DIAGNOSIS — Z8249 Family history of ischemic heart disease and other diseases of the circulatory system: Secondary | ICD-10-CM

## 2018-09-09 DIAGNOSIS — I952 Hypotension due to drugs: Secondary | ICD-10-CM | POA: Diagnosis not present

## 2018-09-09 DIAGNOSIS — I4891 Unspecified atrial fibrillation: Secondary | ICD-10-CM | POA: Diagnosis not present

## 2018-09-09 DIAGNOSIS — Y92009 Unspecified place in unspecified non-institutional (private) residence as the place of occurrence of the external cause: Secondary | ICD-10-CM | POA: Diagnosis not present

## 2018-09-09 DIAGNOSIS — Z880 Allergy status to penicillin: Secondary | ICD-10-CM

## 2018-09-09 DIAGNOSIS — Z7902 Long term (current) use of antithrombotics/antiplatelets: Secondary | ICD-10-CM

## 2018-09-09 DIAGNOSIS — Z9181 History of falling: Secondary | ICD-10-CM

## 2018-09-09 DIAGNOSIS — I6523 Occlusion and stenosis of bilateral carotid arteries: Secondary | ICD-10-CM | POA: Diagnosis present

## 2018-09-09 DIAGNOSIS — N179 Acute kidney failure, unspecified: Secondary | ICD-10-CM | POA: Diagnosis not present

## 2018-09-09 DIAGNOSIS — F5104 Psychophysiologic insomnia: Secondary | ICD-10-CM | POA: Diagnosis present

## 2018-09-09 DIAGNOSIS — I672 Cerebral atherosclerosis: Secondary | ICD-10-CM | POA: Diagnosis present

## 2018-09-09 DIAGNOSIS — R52 Pain, unspecified: Secondary | ICD-10-CM

## 2018-09-09 DIAGNOSIS — I69322 Dysarthria following cerebral infarction: Secondary | ICD-10-CM | POA: Diagnosis not present

## 2018-09-09 DIAGNOSIS — Z79891 Long term (current) use of opiate analgesic: Secondary | ICD-10-CM

## 2018-09-09 DIAGNOSIS — I69392 Facial weakness following cerebral infarction: Secondary | ICD-10-CM | POA: Diagnosis not present

## 2018-09-09 DIAGNOSIS — R5381 Other malaise: Secondary | ICD-10-CM | POA: Diagnosis present

## 2018-09-09 DIAGNOSIS — R001 Bradycardia, unspecified: Secondary | ICD-10-CM | POA: Diagnosis not present

## 2018-09-09 DIAGNOSIS — M19042 Primary osteoarthritis, left hand: Secondary | ICD-10-CM | POA: Diagnosis present

## 2018-09-09 DIAGNOSIS — M19041 Primary osteoarthritis, right hand: Secondary | ICD-10-CM | POA: Diagnosis present

## 2018-09-09 DIAGNOSIS — R0600 Dyspnea, unspecified: Secondary | ICD-10-CM

## 2018-09-09 DIAGNOSIS — M170 Bilateral primary osteoarthritis of knee: Secondary | ICD-10-CM | POA: Diagnosis present

## 2018-09-09 LAB — CBC WITH DIFFERENTIAL/PLATELET
Abs Immature Granulocytes: 0.05 10*3/uL (ref 0.00–0.07)
Basophils Absolute: 0.1 10*3/uL (ref 0.0–0.1)
Basophils Relative: 0 %
Eosinophils Absolute: 0.1 10*3/uL (ref 0.0–0.5)
Eosinophils Relative: 1 %
HCT: 36.8 % (ref 36.0–46.0)
Hemoglobin: 11.4 g/dL — ABNORMAL LOW (ref 12.0–15.0)
Immature Granulocytes: 0 %
Lymphocytes Relative: 9 %
Lymphs Abs: 1.4 10*3/uL (ref 0.7–4.0)
MCH: 28.6 pg (ref 26.0–34.0)
MCHC: 31 g/dL (ref 30.0–36.0)
MCV: 92.2 fL (ref 80.0–100.0)
Monocytes Absolute: 1.2 10*3/uL — ABNORMAL HIGH (ref 0.1–1.0)
Monocytes Relative: 9 %
Neutro Abs: 11.7 10*3/uL — ABNORMAL HIGH (ref 1.7–7.7)
Neutrophils Relative %: 81 %
Platelets: 237 10*3/uL (ref 150–400)
RBC: 3.99 MIL/uL (ref 3.87–5.11)
RDW: 14.8 % (ref 11.5–15.5)
WBC: 14.5 10*3/uL — ABNORMAL HIGH (ref 4.0–10.5)
nRBC: 0 % (ref 0.0–0.2)

## 2018-09-09 LAB — COMPREHENSIVE METABOLIC PANEL
ALT: 15 U/L (ref 0–44)
AST: 18 U/L (ref 15–41)
Albumin: 3.4 g/dL — ABNORMAL LOW (ref 3.5–5.0)
Alkaline Phosphatase: 94 U/L (ref 38–126)
Anion gap: 14 (ref 5–15)
BUN: 11 mg/dL (ref 8–23)
CO2: 25 mmol/L (ref 22–32)
Calcium: 9.3 mg/dL (ref 8.9–10.3)
Chloride: 99 mmol/L (ref 98–111)
Creatinine, Ser: 1.13 mg/dL — ABNORMAL HIGH (ref 0.44–1.00)
GFR calc Af Amer: 57 mL/min — ABNORMAL LOW (ref >60)
GFR calc non Af Amer: 49 mL/min — ABNORMAL LOW (ref >60)
Glucose, Bld: 210 mg/dL — ABNORMAL HIGH (ref 70–99)
Potassium: 3.3 mmol/L — ABNORMAL LOW (ref 3.5–5.1)
Sodium: 138 mmol/L (ref 135–145)
Total Bilirubin: 1.8 mg/dL — ABNORMAL HIGH (ref 0.3–1.2)
Total Protein: 6.7 g/dL (ref 6.5–8.1)

## 2018-09-09 LAB — URINALYSIS, ROUTINE W REFLEX MICROSCOPIC
Bilirubin Urine: NEGATIVE
Glucose, UA: NEGATIVE mg/dL
Ketones, ur: NEGATIVE mg/dL
Leukocytes,Ua: NEGATIVE
Nitrite: NEGATIVE
Protein, ur: 100 mg/dL — AB
Specific Gravity, Urine: 1.008 (ref 1.005–1.030)
pH: 5 (ref 5.0–8.0)

## 2018-09-09 LAB — CBG MONITORING, ED
Glucose-Capillary: 184 mg/dL — ABNORMAL HIGH (ref 70–99)
Glucose-Capillary: 194 mg/dL — ABNORMAL HIGH (ref 70–99)

## 2018-09-09 LAB — SARS CORONAVIRUS 2 BY RT PCR (HOSPITAL ORDER, PERFORMED IN ~~LOC~~ HOSPITAL LAB): SARS Coronavirus 2: NEGATIVE

## 2018-09-09 LAB — LACTIC ACID, PLASMA: Lactic Acid, Venous: 1.2 mmol/L (ref 0.5–1.9)

## 2018-09-09 MED ORDER — SODIUM CHLORIDE 0.9 % IV SOLN
2.0000 g | Freq: Two times a day (BID) | INTRAVENOUS | Status: DC
  Administered 2018-09-09 – 2018-09-13 (×8): 2 g via INTRAVENOUS
  Filled 2018-09-09 (×8): qty 2

## 2018-09-09 MED ORDER — VANCOMYCIN HCL 10 G IV SOLR
2000.0000 mg | Freq: Once | INTRAVENOUS | Status: AC
  Administered 2018-09-09: 2000 mg via INTRAVENOUS
  Filled 2018-09-09: qty 2000

## 2018-09-09 MED ORDER — SODIUM CHLORIDE 0.9% FLUSH: 3.0000 mL | Freq: Once | INTRAVENOUS | Status: DC

## 2018-09-09 MED ORDER — FUROSEMIDE 10 MG/ML IJ SOLN
40.0000 mg | Freq: Once | INTRAMUSCULAR | Status: AC
  Administered 2018-09-09: 40 mg via INTRAVENOUS
  Filled 2018-09-09: qty 4

## 2018-09-09 MED ORDER — VANCOMYCIN HCL IN DEXTROSE 1-5 GM/200ML-% IV SOLN
1000.0000 mg | INTRAVENOUS | Status: DC
  Administered 2018-09-10 – 2018-09-12 (×3): 1000 mg via INTRAVENOUS
  Filled 2018-09-09 (×3): qty 200

## 2018-09-09 MED ORDER — LEVOFLOXACIN IN D5W 500 MG/100ML IV SOLN
500.0000 mg | Freq: Once | INTRAVENOUS | Status: DC
  Filled 2018-09-09: qty 100

## 2018-09-09 NOTE — ED Notes (Signed)
Patient transported to X-ray 

## 2018-09-09 NOTE — ED Triage Notes (Addendum)
Pt here for eval of multiple things. Pt was in rehab 4 weeks ago. Pt has had slurred speech intermittently since then. Wednesday she became dizzy. She has fallen 3 times. Takes a blood thinner. Pt has significant bruising to the left upper back, left leg which also has significant swelling, some bruising forearms, she also has right lower back pain. Reports nausea. 99.2 oral temp at triage but very warm to touch. PT also endorses some shortness of breath.

## 2018-09-09 NOTE — ED Notes (Signed)
Ua and UC collected

## 2018-09-09 NOTE — ED Provider Notes (Signed)
[facility] EMERGENCY DEPARTMENT Provider Note   CSN: [id number] Arrival date & time: 09/09/18  1426     History   Chief Complaint Chief Complaint  Patient presents with  . Dizziness  . Fall  . Shortness of Breath    HPI NYEEMA WANT is a 73 y.o. female.     HPI   73 year old female with multiple health problems who was discharged after ORIF of the left ankle approximately 1 month ago.  She has been home from rehab for the past 4 weeks.  She has had multiple falls with the most recent fall being on Monday and again on Wednesday.  She is on Eliquis.  She has contusions from these falls.  Chiefly she complaining of left leg pain after the most recent fall.  She also feels that she is somewhat dehydrated has decreased p.o. intake.  She does not strike her head but she has had some headache.  She has also had some cough but denies any fever or COVID exposure.  She is on oxygen as needed at home. Past Medical History:  Diagnosis Date  . Abnormality of gait 05/14/2014  . Anemia    hx  . Anginal pain ([place])   . Anxiety   . Asthma   . Basal cell carcinoma 05/2014   "left shoulder"  . Bundle branch block, left    chronic/notes 07/18/2013  . CHF (congestive heart failure) ([place])   . Chronic bronchitis ([place])    "off and on all the time" (07/18/2013)  . Chronic insomnia 05/06/2015  . Chronic kidney disease    frequency, sees dr Jamal Maes every 4 to 6 months (01/16/2018)  . Chronic low back pain 08/24/2016  . Chronic lower back pain   . Claustrophobia   . Common migraine 05/14/2014  . Coronary artery disease   . Depression   . Dysrhythmia   . GERD (gastroesophageal reflux disease)   . H/O hiatal hernia   . Headache    "at least 2/month" (01/16/2018)  . Heart murmur   . Hyperlipidemia   . Hypertension   . Irregular heart beat   . Leg cramps    both legs at times  . Melanoma ([place]) 05/2014   "burned off BLE" (01/16/2018)  . Memory change 05/14/2014  .  Migraine    "5-6/year"  (01/16/2018)  . Myocardial infarction ([place]) 04/1999, 02/2000, 01/2005; 2011; 2014  . Neuromuscular disorder ([place])    ?  . Obesity 01-2010  . Obstructive sleep apnea    "can't wear machine; I have claustrophobia" (01/16/2018), states she had a 2nd sleep study and does not have sleep apnea, her O2 decreases and now is on 2 L of O2 at night.  . On home oxygen therapy    "2L at night and prn during daytime" (01/16/2018)  . Osteoarthritis    "knees and hands" (01/16/2018)  . Other and unspecified angina pectoris   . Peripheral vascular disease (HCC)    ? numbness, tingling arms and legs  . Pneumonia 2000's   "once"  . PONV (postoperative nausea and vomiting)   . Shortness of breath    with exertion  . Stroke ([place]) 03/22/12   right side brain; denies residual on 07/18/2013)  . Stroke [facility]) Oct. 2015   Affected pt.s balance  . Stroke [facility]) 10/2014   "affected my legs; fully recovered now"; still have sporatic memory issues from this one" (01/16/2018)  . Type II diabetes mellitus ([place])   .  Ventral hernia    hx of  . Vomiting    persistent  . Vomiting blood     Patient Active Problem List   Diagnosis Date Noted  . Ankle fracture, left 05/16/2018  . Amiodarone pulmonary toxicity   . Physical deconditioning   . 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 Acute on chronic congestive heart failure ([place])   . Acute on chronic combined systolic and diastolic CHF (congestive heart failure) ([place]) 01/16/2018  . Acute on chronic diastolic CHF (congestive heart failure) ([place]) 01/16/2018  . Acute on chronic diastolic (congestive) heart failure ([place]) 01/16/2018  . Atrial fibrillation ([place]) 10/21/2017  . CAD (coronary artery disease) of bypass graft 10/20/2017  . Coronary artery disease of bypass graft of native heart with stable angina pectoris ([place])   . Chronic low back pain 08/24/2016  . Pain of left thumb 08/05/2016  . Nocturnal hypoxia 06/02/2016  . Hoarseness 04/05/2016  .  Laryngopharyngeal reflux (LPR) 04/05/2016  . Pharyngoesophageal dysphagia 04/05/2016  . Angina decubitus ([place]) 05/22/2015  . Chronic insomnia 05/06/2015  . Common migraine 05/14/2014  . Abnormality of gait 05/14/2014  . Memory change 05/14/2014  . Aneurysm, cerebral, nonruptured 05/14/2014  . Cramp of limb-Left neck 05/10/2014  . Hematemesis with nausea   . Vomiting blood   . Dizziness and giddiness 09/21/2013  . Atypical chest pain 07/18/2013  . Unstable angina ([place]) 04/09/2013  . Carotid artery stenosis, symptomatic 03/20/2013  . Chronic diastolic CHF (congestive heart failure) ([place]) 09/23/2012  . Cerebrovascular disease 07/10/2012  . Cerebral artery occlusion with cerebral infarction ([place]) 07/10/2012  . Mitral regurgitation 04/15/2012  . TIA (transient ischemic attack) 04/15/2012  . Occlusion and stenosis of carotid artery without mention of cerebral infarction 08/18/2011  . Bariatric surgery status 06/17/2011  . Speech abnormality 03/22/2011  . Dyspnea 02/24/2011  . PAPILLARY MUSCLE DYSFUNCTION, NON-RHEUMATIC 10/09/2008  . UNSPECIFIED VITAMIN D DEFICIENCY 10/24/2007  . MYOCARDIAL INFARCTION, HX OF 10/24/2007  . PERSISTENT VOMITING 10/24/2007  . OSTEOARTHRITIS 10/24/2007  . MIGRAINES, HX OF 10/24/2007  . DM 01/16/2007  . Hyperlipemia 01/16/2007  . Obesity-post failed open gastroplasty 1984  01/16/2007  . OBSTRUCTIVE SLEEP APNEA 01/16/2007  . Essential hypertension 01/16/2007  . Coronary atherosclerosis 01/16/2007  . Asthma 01/16/2007  . GERD 01/16/2007  . VENTRAL HERNIA 01/16/2007    Past Surgical History:  Procedure Laterality Date  . ANKLE FRACTURE SURGERY Left 1970's  . APPENDECTOMY  1970's   w/hysterectomy  . BASAL CELL CARCINOMA EXCISION Left 05/2014   "shoulder" (01/16/2018)  . CARDIAC CATHETERIZATION  10/10/2012   Dr [name].  Marland Kitchen CARDIAC CATHETERIZATION N/A 05/29/2015   Procedure: Right/Left Heart Cath and Coronary/Graft Angiography;  Surgeon: Larey Dresser,  MD;  Location: [facility] CV LAB;  Service: Cardiovascular;  Laterality: N/A;  . CAROTID ENDARTERECTOMY Left 03/2013  . CAROTID STENT INSERTION Left 03/20/2013   Procedure: CAROTID STENT INSERTION;  Surgeon: [name], MD;  Location: [facility] CATH LAB;  Service: Cardiovascular;  Laterality: Left;  internal carotid  . CEREBRAL ANGIOGRAM N/A 04/05/2011   Procedure: CEREBRAL ANGIOGRAM;  Surgeon: Angelia Mould, MD;  Location: [facility] CATH LAB;  Service: Cardiovascular;  Laterality: N/A;  . CHOLECYSTECTOMY OPEN  2004  . CORONARY ANGIOPLASTY WITH STENT PLACEMENT  01,02,05,06,07,08,11; 04/24/2013   "I've probably got ~ 10 stents by now" (04/24/2013)  . CORONARY ANGIOPLASTY WITH STENT PLACEMENT  06/13/2013   "got 4 stents today" (06/13/2013)  . CORONARY ARTERY BYPASS GRAFT  1220/11   "CABG X5"  .  CORONARY STENT INTERVENTION N/A 10/20/2017   Procedure: CORONARY STENT INTERVENTION;  Surgeon: Troy Sine, MD;  Location: [facility] CV LAB;  Service: Cardiovascular;  Laterality: N/A;  . ESOPHAGOGASTRODUODENOSCOPY  08/03/2011   Procedure: ESOPHAGOGASTRODUODENOSCOPY (EGD);  Surgeon: Shann Medal, MD;  Location: [facility];  Service: General;  Laterality: N/A;  . ESOPHAGOGASTRODUODENOSCOPY (EGD) WITH PROPOFOL N/A 03/11/2014   Procedure: ESOPHAGOGASTRODUODENOSCOPY (EGD) WITH PROPOFOL;  Surgeon: Lafayette Dragon, MD;  Location: [facility] ENDOSCOPY;  Service: Endoscopy;  Laterality: N/A;  . FRACTURE SURGERY    . gall stone removal  05/2003  . GASTRIC RESTRICTION SURGERY  1984   "stapeling"  . HERNIA REPAIR  2004   "in my stomach; had OR on it twice", wire mesh on 1 hernia  . LEFT HEART CATH AND CORS/GRAFTS ANGIOGRAPHY N/A 07/09/2016   Procedure: LEFT HEART CATH AND CORS/GRAFTS ANGIOGRAPHY;  Surgeon: Larey Dresser, MD;  Location: [facility] CV LAB;  Service: Cardiovascular;  Laterality: N/A;  . ORIF ANKLE FRACTURE Left 05/16/2018  . ORIF ANKLE FRACTURE Left 05/16/2018   Procedure: OPEN REDUCTION INTERNAL FIXATION  (ORIF) Left ankle with possible syndesmosis fixation;  Surgeon: Nicholes Stairs, MD;  Location: [facility];  Service: Orthopedics;  Laterality: Left;  116min  . OVARY SURGERY  1970's   "tumor removed"  . PERCUTANEOUS CORONARY STENT INTERVENTION (PCI-S) N/A 06/13/2013   Procedure: PERCUTANEOUS CORONARY STENT INTERVENTION (PCI-S);  Surgeon: [name], MD;  Location: [facility] CATH LAB;  Service: Cardiovascular;  Laterality: N/A;  . PERCUTANEOUS STENT INTERVENTION N/A 04/24/2013   Procedure: PERCUTANEOUS STENT INTERVENTION;  Surgeon: [name], MD;  Location: [facility] CATH LAB;  Service: Cardiovascular;  Laterality: N/A;  . RIGHT/LEFT HEART CATH AND CORONARY/GRAFT ANGIOGRAPHY N/A 10/20/2017   Procedure: RIGHT/LEFT HEART CATH AND CORONARY/GRAFT ANGIOGRAPHY;  Surgeon: Larey Dresser, MD;  Location: [facility] CV LAB;  Service: Cardiovascular;  Laterality: N/A;  . ROOT CANAL  10/2000  . TIBIA FRACTURE SURGERY Right 1970's   rods and pins  . TOOTH EXTRACTION     "1 on the upper; wisdom tooth on the lower" (01/16/2018)  . TOTAL ABDOMINAL HYSTERECTOMY  1970's   w/ appendectomy     OB History   No obstetric history on file.      Home Medications    Prior to Admission medications   Medication Sig Start Date End Date Taking? Authorizing Provider  Alirocumab (PRALUENT) 150 MG/ML SOAJ Inject 1 pen into the skin every 14 (fourteen) days. 08/18/18   Larey Dresser, MD  ALPRAZolam Duanne Moron) 0.5 MG tablet Take 1 tablet (0.5 mg total) by mouth 2 (two) times daily. 04/10/18   Cherene Altes, MD  apixaban (ELIQUIS) 5 MG TABS tablet Take 1 tablet (5 mg total) by mouth 2 (two) times daily. 05/12/18   Larey Dresser, MD  bisacodyl (DULCOLAX) 5 MG EC tablet Take 1 tablet (5 mg total) by mouth daily as needed for moderate constipation. 05/19/18 05/19/19  Nicholes Stairs, MD  budesonide (PULMICORT) 0.25 MG/2ML nebulizer solution Take 2 mLs (0.25 mg total) by nebulization 2 (two) times daily. 04/10/18    Cherene Altes, MD  buPROPion (WELLBUTRIN XL) 150 MG 24 hr tablet Take 150 mg by mouth daily.    [provider]  clopidogrel (PLAVIX) 75 MG tablet Take 1 tablet (75 mg total) by mouth at bedtime. 05/12/18   Larey Dresser, MD  DULoxetine (CYMBALTA) 30 MG capsule Take 1 capsule (30 mg total) by mouth daily. 04/11/18  Cherene Altes, MD  ezetimibe (ZETIA) 10 MG tablet Take 1 tablet (10 mg total) by mouth at bedtime. 04/10/18   Cherene Altes, MD  gabapentin (NEURONTIN) 300 MG capsule Take 2 capsules (600 mg total) by mouth 2 (two) times daily. 04/10/18   Cherene Altes, MD  insulin aspart (NOVOLOG) 100 UNIT/ML injection Inject 8 Units into the skin 3 (three) times daily with meals. 04/10/18   Cherene Altes, MD  insulin glargine (LANTUS) 100 UNIT/ML injection Inject 0.35 mLs (35 Units total) into the skin at bedtime. 04/10/18   Cherene Altes, MD  ipratropium-albuterol (DUONEB) 0.5-2.5 (3) MG/3ML SOLN Take 3 mLs by nebulization 2 (two) times daily. Patient taking differently: Take 3 mLs by nebulization 2 (two) times daily as needed (shortness of breath).  04/10/18   Cherene Altes, MD  metoprolol succinate (TOPROL-XL) 50 MG 24 hr tablet Take 25mg  daily with or immediately following a meal. 08/29/18   Larey Dresser, MD  Multiple Vitamins-Minerals (HEALTHY EYES/LUTEIN) TABS Take 1 tablet by mouth daily.    [provider]  nitroGLYCERIN (NITROSTAT) 0.3 MG SL tablet Place 1 tablet (0.3 mg total) under the tongue every 5 (five) minutes x 3 doses as needed for chest pain. 09/15/17   Georgiana Shore, NP  oxyCODONE (OXY IR/ROXICODONE) 5 MG immediate release tablet Take 1 tablet (5 mg total) by mouth every 4 (four) hours as needed for moderate pain or severe pain. 05/19/18   Nicholes Stairs, MD  OXYGEN Inhale 2 L into the lungs at bedtime.     [provider]  pantoprazole (PROTONIX) 40 MG tablet Take 1 tablet (40 mg total) by mouth daily. 04/11/18    Cherene Altes, MD  rosuvastatin (CRESTOR) 40 MG tablet Take 1 tablet (40 mg total) by mouth at bedtime. 04/10/18   Cherene Altes, MD  spironolactone (ALDACTONE) 25 MG tablet Take 1 tablet (25 mg total) by mouth daily. 04/11/18   Cherene Altes, MD  torsemide (DEMADEX) 20 MG tablet Take 1 tablet (20 mg total) by mouth every other day. 06/02/18   Georgiana Shore, NP  traMADol (ULTRAM) 50 MG tablet Take 1 tablet (50 mg total) by mouth every 6 (six) hours as needed for moderate pain. 05/19/18   Nicholes Stairs, MD  vitamin B-12 (CYANOCOBALAMIN) 1000 MCG tablet Take 1,000 mcg by mouth daily.    [provider]    Family History Family History  Problem Relation Age of Onset  . Heart disease Father        Heart Disease before age 7  . Diabetes Father   . Hyperlipidemia Father   . Hypertension Father   . Heart attack Father   . Deep vein thrombosis Father   . AAA (abdominal aortic aneurysm) Father   . Hypertension Mother   . Deep vein thrombosis Mother   . AAA (abdominal aortic aneurysm) Mother   . Cancer Sister        Ovarian  . Hypertension Sister   . Diabetes Paternal Grandmother   . Heart disease Paternal Uncle   . Diabetes Paternal Uncle   . Heart attack Paternal Grandfather 47       died of MI at 40  . Diabetes Paternal Aunt   . Diabetes Paternal Uncle   . Colon cancer Neg Hx     Social History Social History   Tobacco Use  . Smoking status: Never Smoker  . Smokeless tobacco: Never Used  Substance  Use Topics  . Alcohol use: Never    Frequency: Never  . Drug use: Never     Allergies   Amoxicillin, Brilinta [ticagrelor], Erythromycin, Flagyl [metronidazole], Penicillins, Isosorbide mononitrate [isosorbide nitrate], Jardiance [empagliflozin], Metformin and related, Tape, and Erythromycin base   Review of Systems Review of Systems  All other systems reviewed and are negative.    Physical Exam Updated Vital Signs BP (!) 154/82 (BP Location:  Right Arm)   Pulse 94   Temp 99.4 F (37.4 C)   Resp (!) 24   SpO2 98%   Physical Exam Vitals signs and nursing note reviewed.  Constitutional:      General: She is not in acute distress.    Appearance: She is well-developed. She is obese. She is not ill-appearing.  HENT:     Head: Normocephalic.     Mouth/Throat:     Comments: Tongue and mucous membranes are dry Eyes:     Extraocular Movements: Extraocular movements intact.     Pupils: Pupils are equal, round, and reactive to light.  Neck:     Musculoskeletal: Normal range of motion and neck supple.  Cardiovascular:     Rate and Rhythm: Normal rate and regular rhythm.  Pulmonary:     Effort: Pulmonary effort is normal.     Breath sounds: Normal breath sounds.  Abdominal:     General: Bowel sounds are normal.     Palpations: Abdomen is soft.     Tenderness: There is no abdominal tenderness.  Musculoskeletal: Normal range of motion.     Comments: Large contusion left upper leg with mild diffuse tenderness palpation Left ankle with well-healing wound Edema noted above the splint but no erythema or warmth   Skin:    General: Skin is warm and dry.     Capillary Refill: Capillary refill takes less than 2 seconds.     Comments: Large contusion on the back with some tenderness underlying it in the left mid back Contusion of abdomen without tenderness  Neurological:     General: No focal deficit present.     Mental Status: She is alert.     Cranial Nerves: No cranial nerve deficit.     Comments: No palmar drift or leg drift   Psychiatric:        Mood and Affect: Mood normal.        Behavior: Behavior normal.      ED Treatments / Results  Labs (all labs ordered are listed, but only abnormal results are displayed) Labs Reviewed  CBC WITH DIFFERENTIAL/PLATELET - Abnormal; Notable for the following components:      Result Value   WBC 14.5 (*)    Hemoglobin 11.4 (*)    Neutro Abs 11.7 (*)    Monocytes Absolute 1.2 (*)     All other components within normal limits  URINALYSIS, ROUTINE W REFLEX MICROSCOPIC  COMPREHENSIVE METABOLIC PANEL  CBG MONITORING, ED    EKG EKG Interpretation  Date/Time:  Saturday September 09 2018 14:35:06 EDT Ventricular Rate:  98 PR Interval:  148 QRS Duration: 162 QT Interval:  378 QTC Calculation: 482 R Axis:   164 Text Interpretation:  Normal sinus rhythm Right axis deviation Left bundle branch block Abnormal ECG Confirmed by Pattricia Boss [phone]) on 09/09/2018 4:23:03 PM   Radiology Dg Ribs Unilateral Left  Result Date: 09/09/2018 CLINICAL DATA:  Recent falls.  Left leg and rib pain. EXAM: LEFT RIBS - 2 VIEW COMPARISON:  Chest radiographs today.  Chest CT 04/03/2018.  FINDINGS: Two views demonstrate no evidence of acute left-sided rib fracture or focal rib lesion. As noted on earlier radiographs, there is probable pulmonary edema with stable cardiomegaly. No significant pleural effusion or pneumothorax. There are postsurgical changes in the left upper quadrant of the abdomen. IMPRESSION: No evidence of left-sided rib fracture, pleural effusion or pneumothorax. Electronically Signed   By: Richardean Sale M.D.   On: 09/09/2018 18:20   Ct Head Wo Contrast  Result Date: 09/09/2018 CLINICAL DATA:  Head trauma. High clinical risk. Slurred speech. EXAM: CT HEAD WITHOUT CONTRAST TECHNIQUE: Contiguous axial images were obtained from the base of the skull through the vertex without intravenous contrast. COMPARISON:  March 29, 2018 FINDINGS: Brain: No evidence of acute hemorrhage, hydrocephalus, extra-axial collection or mass lesion/mass effect. Chronic right occipital lobe infarct. New areas of hypoattenuation in the subcortical white matter of the right frontal lobe may represent small age-indeterminate ischemic infarcts. Vascular: Calcific atherosclerotic disease of the intra cavernous carotid arteries. Skull: Normal. Negative for fracture or focal lesion. Sinuses/Orbits: Minimal  opacification of the left mastoid air cells. Paranasal sinuses are clear. Other: None. IMPRESSION: 1. New areas of hypoattenuation in the subcortical white matter of the right frontal lobe may represent small age-indeterminate ischemic infarcts. 2. Chronic right occipital lobe infarct. 3. No evidence of traumatic injury to the brain. Electronically Signed   By: Fidela Salisbury M.D.   On: 09/09/2018 17:29   Dg Chest Port 1 View  Result Date: 09/09/2018 CLINICAL DATA:  Shortness of breath multiple recent falls and slurred speech. History of diabetes and stroke. EXAM: PORTABLE CHEST 1 VIEW COMPARISON:  Radiographs 05/18/2018 and 04/05/2018.  CT 04/03/2018. FINDINGS: 1549 hours. Stable cardiomegaly post median sternotomy and CABG. Pulmonary vascular congestion has slightly worsened, and there is probable mild pulmonary edema. No confluent airspace opacity, pleural effusion or pneumothorax. The bones appear intact. Telemetry leads overlie the chest. IMPRESSION: Cardiomegaly with increased vascular congestion and probable mild pulmonary edema. No confluent airspace opacity or pneumothorax. Electronically Signed   By: Richardean Sale M.D.   On: 09/09/2018 16:31   Dg Femur Min 2 Views Left  Result Date: 09/09/2018 CLINICAL DATA:  Recent falls.  Left thigh pain. EXAM: LEFT FEMUR 2 VIEWS COMPARISON:  Left hip radiographs 07/31/2018. FINDINGS: The mineralization and alignment are normal. There is no evidence of acute fracture or dislocation. Stable mild degenerative changes at the left hip and knee. There are scattered vascular calcifications and vascular clips. No unexpected foreign bodies. IMPRESSION: No evidence of acute fracture or dislocation. Electronically Signed   By: Richardean Sale M.D.   On: 09/09/2018 18:22    Procedures Procedures (including critical care time)  Medications Ordered in ED Medications  sodium chloride flush (NS) 0.9 % injection 3 mL (has no administration in time range)      Initial Impression / Assessment and Plan / ED Course  I have reviewed the triage vital signs and the nursing notes.  Pertinent labs & imaging results that were available during my care of the patient were reviewed by me and considered in my medical decision making (see chart for details).       73 yo female with recent surgery who presents after multiple falls this week.  X-rays done to evaluate for injury from falls wihtout acute fx or abnormality noted.  Patient with fever here and probable source of urine-blood cultures obtained.   1-fever-?UTI- levaquin ordered, leukocytosis, hr normal,  2- falls- ct with new areas of hypoattenuation right frontal  lobe- ? New strokes/subacute 3- chf- increased markings on cxr- lasix ordered here  Discussed with Dr. Blaine Hamper, on-call for hospitalist and will see him for admission.  He requested consult to neuro hospitalist and page has been placed Discussed with Dr. Leida Lauth will see in consult Final Clinical Impressions(s) / ED Diagnoses   Final diagnoses:  Febrile illness  Cerebrovascular accident (CVA), unspecified mechanism [facility])    ED Discharge Orders    None       Pattricia Boss, MD 09/09/18 1956

## 2018-09-09 NOTE — ED Notes (Signed)
Py passed her swallow screen

## 2018-09-09 NOTE — Telephone Encounter (Signed)
   Received page from Answering Service that patient is having heart rates in the 93 to 103 range at rest after being taken off beta-blocker due to bradycardia. Called and spoke with patient's daughter. Daughter states patient has been doing poorly since falling on Wednesday night secondary to dizziness and is getting progressively worse. She reportedly has a lot of swelling on one leg. Also has been more lethargic with intermittent episodes of slurred speech. Advised daughter that patient needs to come to the Emergency Department department for further evaluation. Daughter voiced understanding and states someone will bring her to [facility]  Darreld Mclean, PA-C 09/09/2018 12:53 PM

## 2018-09-09 NOTE — H&P (Addendum)
History and Physical    [name] [name]:[id number] DOB: 1945/06/13 DOA: 09/09/2018  Referring MD/NP/PA:   PCP: Derinda Late, MD   Patient coming from:  The patient is coming from home.  At baseline, pt is independent for most of ADL.        Chief Complaint: Slurred speech, fall, fever  HPI: [name] [name] is a 73 y.o. female with medical history significant of hypertension, hyperlipidemia, diabetes mellitus, asthma, stroke, GERD, depression with anxiety, PVD, OSA not on CPAP, CAD, stent, CABG, sCHF with EF 25%, memory change, atrial fibrillation on Eliquis, left bundle blockage, CKD stage III, who presents with slurred speech, fall, fever.  Patient states that she has been having generalized weakness and dizziness in the past several days.  Today she developed mild slurred speech.  No vision loss or hearing loss.  Denies numbness or tingling to extremities.  She fell 3 times at home, and injured her left upper back, and the left leg, causing some pain in those areas.  She also has bruises to left upper back and left leg.  No loss of consciousness.  Patient also reports shortness of breath and mild dry cough, but no chest pain.  She has fever, no chills.  She has mild burning on urination, but denies dysuria or increased urinary frequency.  No nausea vomiting, diarrhea, abdominal pain  ED Course: pt was found to have WBC 14.5, lactic acid 1.2, negative COVID-19 test, negative urinalysis, potassium 3.3, stable renal function, temperature 100.9, blood pressure 177/71, heart rate 94, oxygen saturation 91 to 99% on room air, tachypnea, chest x-ray showed cardiomegaly and vascular congestion and mild pulmonary edema.  X-ray of left femur and left rib negative for fracture. CT-head finding is concerning for stroke. Pt is admitted to telemetry bed as inpatient.  Neurology, Dr. Lorraine Lax was consulted.  CT head showed: 1. New areas of hypoattenuation in the subcortical white matter of the right frontal  lobe may represent small age-indeterminate ischemic infarcts. 2. Chronic right occipital lobe infarct. 3. No evidence of traumatic injury to the brain   Review of Systems:   General: has fevers, no chills, no body weight gain, has fatigue HEENT: no blurry vision, hearing changes or sore throat Respiratory: has dyspnea, coughing, no wheezing CV: no chest pain, no palpitations GI: no nausea, vomiting, abdominal pain, diarrhea, constipation GU: no dysuria, has burning on urination, no increased urinary frequency, hematuria  Ext: has leg edema Neuro: no unilateral weakness, numbness, or tingling, no vision change or hearing loss Skin: no rash.  Has bruises to left upper back and left leg MSK: No muscle spasm, no deformity, no limitation of range of movement in spin Heme: No easy bruising.  Travel history: No recent long distant travel.  Allergy:  Allergies  Allergen Reactions  . Amoxicillin Shortness Of Breath and Rash  . Brilinta [Ticagrelor] Shortness Of Breath  . Erythromycin Shortness Of Breath, Other (See Comments) and Hives    Trouble swallowing  . Flagyl [Metronidazole] Shortness Of Breath and Palpitations  . Penicillins Hives, Shortness Of Breath, Rash and Other (See Comments)    Has patient had a PCN reaction causing immediate rash, facial/tongue/throat swelling, SOB or lightheadedness with hypotension: Yes Has patient had a PCN reaction causing severe rash involving mucus membranes or skin necrosis: No Has patient had a PCN reaction that required hospitalization: Yes Has patient had a PCN reaction occurring within the last 10 years: No If all of the above answers are "NO", then  may proceed with Cephalosporin use.   . Isosorbide Mononitrate [Isosorbide Nitrate] Other (See Comments)    Joint aches, muscles hurt, difficult to walk   . Jardiance [Empagliflozin] Other (See Comments)    Nausea, joint aches, muscles aches  . Metformin And Related Other (See Comments)     Stomach pain, cold sweats, joint pain, burred vision, dizziness  . Tape Other (See Comments)    Skin pulls off with certain types Plastic tape causes skin to rip if left on for long periods of time  . Erythromycin Base Rash    Past Medical History:  Diagnosis Date  . Abnormality of gait 05/14/2014  . Anemia    hx  . Anginal pain ([place])   . Anxiety   . Asthma   . Basal cell carcinoma 05/2014   "left shoulder"  . Bundle branch block, left    chronic/notes 07/18/2013  . CHF (congestive heart failure) ([place])   . Chronic bronchitis ([place])    "off and on all the time" (07/18/2013)  . Chronic insomnia 05/06/2015  . Chronic kidney disease    frequency, sees dr Jamal Maes every 4 to 6 months (01/16/2018)  . Chronic low back pain 08/24/2016  . Chronic lower back pain   . Claustrophobia   . Common migraine 05/14/2014  . Coronary artery disease   . Depression   . Dysrhythmia   . GERD (gastroesophageal reflux disease)   . H/O hiatal hernia   . Headache    "at least 2/month" (01/16/2018)  . Heart murmur   . Hyperlipidemia   . Hypertension   . Irregular heart beat   . Leg cramps    both legs at times  . Melanoma ([place]) 05/2014   "burned off BLE" (01/16/2018)  . Memory change 05/14/2014  . Migraine    "5-6/year"  (01/16/2018)  . Myocardial infarction ([place]) 04/1999, 02/2000, 01/2005; 2011; 2014  . Neuromuscular disorder ([place])    ?  . Obesity 01-2010  . Obstructive sleep apnea    "can't wear machine; I have claustrophobia" (01/16/2018), states she had a 2nd sleep study and does not have sleep apnea, her O2 decreases and now is on 2 L of O2 at night.  . On home oxygen therapy    "2L at night and prn during daytime" (01/16/2018)  . Osteoarthritis    "knees and hands" (01/16/2018)  . Other and unspecified angina pectoris   . Peripheral vascular disease (H[facility])    ? numbness, tingling arms and legs  . Pneumonia 2000's   "once"  . PONV (postoperative nausea and vomiting)   . Shortness of  breath    with exertion  . Stroke ([place]) 03/22/12   right side brain; denies residual on 07/18/2013)  . Stroke [facility]) Oct. 2015   Affected pt.s balance  . Stroke [facility]) 10/2014   "affected my legs; fully recovered now"; still have sporatic memory issues from this one" (01/16/2018)  . Type II diabetes mellitus ([place])   . Ventral hernia    hx of  . Vomiting    persistent  . Vomiting blood     Past Surgical History:  Procedure Laterality Date  . ANKLE FRACTURE SURGERY Left 1970's  . APPENDECTOMY  1970's   w/hysterectomy  . BASAL CELL CARCINOMA EXCISION Left 05/2014   "shoulder" (01/16/2018)  . CARDIAC CATHETERIZATION  10/10/2012   Dr [name].  Marland Kitchen CARDIAC CATHETERIZATION N/A 05/29/2015   Procedure: Right/Left Heart Cath and Coronary/Graft Angiography;  Surgeon: Larey Dresser, MD;  Location: [facility] [facility];  Service: Cardiovascular;  Laterality: N/A;  . CAROTID ENDARTERECTOMY Left 03/2013  . CAROTID STENT INSERTION Left 03/20/2013   Procedure: CAROTID STENT INSERTION;  Surgeon: [name], MD;  Location: [facility] CATH LAB;  Service: Cardiovascular;  Laterality: Left;  internal carotid  . CEREBRAL ANGIOGRAM N/A 04/05/2011   Procedure: CEREBRAL ANGIOGRAM;  Surgeon: Angelia Mould, MD;  Location[facility] C[facility] LAB;  Service: Cardiovascular;  Laterality: N/A;  . CHOLECYSTECTOMY OPEN  2004  . CORONARY ANGIOPLASTY WITH STENT PLACEMENT  01,02,05,06,07,08,11; 04/24/2013   "I've probably got ~ 10 stents by now" (04/24/2013)  . CORONARY ANGIOPLASTY WITH STENT PLACEMENT  06/13/2013   "got 4 stents today" (06/13/2013)  . CORONARY ARTERY BYPASS GRAFT  1220/11   "CABG X5"  . CORONARY STENT INTERVENTION N/A 10/20/2017   Procedure: CORONARY STENT INTERVENTION;  Surgeon: Troy Sine, MD;  Location: [place] [facility];  Service: Cardiovascular;  Laterality: N/A;  . ESOPHAGOGASTRODUODENOSCOPY  08/03/2011   Procedure: ESOPHAGOGASTRODUODENOSCOPY (EGD);  Surgeon: Shann Medal, MD;  Location: [facility];   Service: General;  Laterality: N/A;  . ESOPHAGOGASTRODUODENOSCOPY (EGD) WITH PROPOFOL N/A 03/11/2014   Procedure: ESOPHAGOGASTRODUODENOSCOPY (EGD) WITH PROPOFOL;  Surgeon: Lafayette Dragon, MD;  Location: [facility] ENDOSCOPY;  Service: Endoscopy;  Laterality: N/A;  . FRACTURE SURGERY    . gall stone removal  05/2003  . GASTRIC RESTRICTION SURGERY  1984   "stapeling"  . HERNIA REPAIR  2004   "in my stomach; had OR on it twice", wire mesh on 1 hernia  . LEFT HEART CATH AND CORS/GRAFTS ANGIOGRAPHY N/A 07/09/2016   Procedure: LEFT HEART CATH AND CORS/GRAFTS ANGIOGRAPHY;  Surgeon: Larey Dresser, MD;  Location: [place] [facility];  Service: Cardiovascular;  Laterality: N/A;  . ORIF ANKLE FRACTURE Left 05/16/2018  . ORIF ANKLE FRACTURE Left 05/16/2018   Procedure: OPEN REDUCTION INTERNAL FIXATION (ORIF) Left ankle with possible syndesmosis fixation;  Surgeon: Nicholes Stairs, MD;  Location: [place];  Service: Orthopedics;  Laterality: Left;  153min  . OVARY SURGERY  1970's   "tumor removed"  . PERCUTANEOUS CORONARY STENT INTERVENTION (PCI-S) N/A 06/13/2013   Procedure: PERCUTANEOUS CORONARY STENT INTERVENTION (PCI-S);  Surgeon: [name], MD;  Location: [facility] CATH LAB;  Service: Cardiovascular;  Laterality: N/A;  . PERCUTANEOUS STENT INTERVENTION N/A 04/24/2013   Procedure: PERCUTANEOUS STENT INTERVENTION;  Surgeon: [name], MD;  Location: [facility] CATH LAB;  Service: Cardiovascular;  Laterality: N/A;  . RIGHT/LEFT HEART CATH AND CORONARY/GRAFT ANGIOGRAPHY N/A 10/20/2017   Procedure: RIGHT/LEFT HEART CATH AND CORONARY/GRAFT ANGIOGRAPHY;  Surgeon: Larey Dresser, MD;  Location: [place] [facility];  Service: Cardiovascular;  Laterality: N/A;  . ROOT CANAL  10/2000  . TIBIA FRACTURE SURGERY Right 1970's   rods and pins  . TOOTH EXTRACTION     "1 on the upper; wisdom tooth on the lower" (01/16/2018)  . TOTAL ABDOMINAL HYSTERECTOMY  1970's   w/ appendectomy    Social History:  reports that she  has never smoked. She has never used smokeless tobacco. She reports that she does not drink alcohol or use drugs.  Family History:  Family History  Problem Relation Age of Onset  . Heart disease Father        Heart Disease before age 31  . Diabetes Father   . Hyperlipidemia Father   . Hypertension Father   . Heart attack Father   . Deep vein thrombosis Father   . AAA (abdominal aortic aneurysm)  Father   . Hypertension Mother   . Deep vein thrombosis Mother   . AAA (abdominal aortic aneurysm) Mother   . Cancer Sister        Ovarian  . Hypertension Sister   . Diabetes Paternal Grandmother   . Heart disease Paternal Uncle   . Diabetes Paternal Uncle   . Heart attack Paternal Grandfather 90       died of MI at 27  . Diabetes Paternal Aunt   . Diabetes Paternal Uncle   . Colon cancer Neg Hx      Prior to Admission medications   Medication Sig Start Date End Date Taking? Authorizing Provider  ADVAIR HFA 115-21 MCG/ACT inhaler Inhale 2 puffs into the lungs. 08/02/18  Yes [provider]  albuterol (VENTOLIN HFA) 108 (90 Base) MCG/ACT inhaler Inhale 2 puffs into the lungs every 6 (six) hours as needed for wheezing.   Yes [provider]  Alirocumab (PRALUENT) 150 MG/ML SOAJ Inject 1 pen into the skin every 14 (fourteen) days. 08/18/18  Yes Larey Dresser, MD  ALPRAZolam Duanne Moron) 0.5 MG tablet Take 1 tablet (0.5 mg total) by mouth 2 (two) times daily. 04/10/18  Yes Cherene Altes, MD  amLODipine (NORVASC) 10 MG tablet Take 10 mg by mouth daily.   Yes [provider]  apixaban (ELIQUIS) 5 MG TABS tablet Take 1 tablet (5 mg total) by mouth 2 (two) times daily. 05/12/18  Yes Larey Dresser, MD  bisacodyl (DULCOLAX) 5 MG EC tablet Take 1 tablet (5 mg total) by mouth daily as needed for moderate constipation. 05/19/18 05/19/19 Yes Nicholes Stairs, MD  budesonide (PULMICORT) 0.25 MG/2ML nebulizer solution Take 2 mLs (0.25 mg total) by nebulization 2 (two) times  daily. 04/10/18  Yes Cherene Altes, MD  buPROPion (WELLBUTRIN XL) 150 MG 24 hr tablet Take 150 mg by mouth daily.   Yes [provider]  clopidogrel (PLAVIX) 75 MG tablet Take 1 tablet (75 mg total) by mouth at bedtime. 05/12/18  Yes Larey Dresser, MD  denosumab (PROLIA) 60 MG/ML SOSY injection Inject 69 mg into the skin every 6 (six) months.   Yes [provider]  DULoxetine (CYMBALTA) 30 MG capsule Take 1 capsule (30 mg total) by mouth daily. 04/11/18  Yes Cherene Altes, MD  ezetimibe (ZETIA) 10 MG tablet Take 1 tablet (10 mg total) by mouth at bedtime. 04/10/18  Yes Cherene Altes, MD  gabapentin (NEURONTIN) 300 MG capsule Take 2 capsules (600 mg total) by mouth 2 (two) times daily. 04/10/18  Yes Cherene Altes, MD  insulin aspart (NOVOLOG) 100 UNIT/ML injection Inject 8 Units into the skin 3 (three) times daily with meals. Patient taking differently: Inject 20 Units into the skin 3 (three) times daily.  04/10/18  Yes Cherene Altes, MD  insulin glargine (LANTUS) 100 UNIT/ML injection Inject 0.35 mLs (35 Units total) into the skin at bedtime. Patient taking differently: Inject 38 Units into the skin at bedtime.  04/10/18  Yes Cherene Altes, MD  ipratropium-albuterol (DUONEB) 0.5-2.5 (3) MG/3ML SOLN Take 3 mLs by nebulization 2 (two) times daily. Patient taking differently: Take 3 mLs by nebulization 2 (two) times daily as needed (shortness of breath).  04/10/18  Yes Cherene Altes, MD  metolazone (ZAROXOLYN) 2.5 MG tablet Take 2.5 mg by mouth daily.   Yes [provider]  Multiple Vitamins-Minerals (HEALTHY EYES/LUTEIN) TABS Take 1 tablet by mouth daily.   Yes [provider]  nitroGLYCERIN (NITROSTAT) 0.3 MG SL tablet Place 1 tablet (0.3 mg total) under the tongue every 5 (five) minutes x 3 doses as needed for chest pain. 09/15/17  Yes Georgiana Shore, NP  ondansetron (ZOFRAN) 4 MG tablet Take 4 mg by mouth every 6 (six) hours as needed for  nausea/vomiting.   Yes [provider]  pantoprazole (PROTONIX) 40 MG tablet Take 1 tablet (40 mg total) by mouth daily. 04/11/18  Yes Cherene Altes, MD  potassium chloride SA (K-DUR) 20 MEQ tablet Take 20 mEq by mouth daily.   Yes [provider]  ranolazine (RANEXA) 1000 MG SR tablet Take 1,000 mg by mouth every 12 (twelve) hours.   Yes [provider]  rosuvastatin (CRESTOR) 40 MG tablet Take 1 tablet (40 mg total) by mouth at bedtime. 04/10/18  Yes Cherene Altes, MD  torsemide (DEMADEX) 20 MG tablet Take 1 tablet (20 mg total) by mouth every other day. 06/02/18  Yes Georgiana Shore, NP  triamcinolone cream (KENALOG) 0.1 % Apply 1 application topically daily. 08/02/18  Yes [provider]  vitamin B-12 (CYANOCOBALAMIN) 1000 MCG tablet Take 1,000 mcg by mouth daily.   Yes [provider]  zolpidem (AMBIEN) 10 MG tablet Take 10 mg by mouth every evening.   Yes [provider]  metoprolol succinate (TOPROL-XL) 50 MG 24 hr tablet Take 25mg  daily with or immediately following a meal. 08/29/18   Larey Dresser, MD  oxyCODONE (OXY IR/ROXICODONE) 5 MG immediate release tablet Take 1 tablet (5 mg total) by mouth every 4 (four) hours as needed for moderate pain or severe pain. Patient not taking: Reported on 09/09/2018 05/19/18   Nicholes Stairs, MD  OXYGEN Inhale 2 L into the lungs at bedtime.     [provider]  spironolactone (ALDACTONE) 25 MG tablet Take 1 tablet (25 mg total) by mouth daily. Patient not taking: Reported on 09/09/2018 04/11/18   Cherene Altes, MD  traMADol (ULTRAM) 50 MG tablet Take 1 tablet (50 mg total) by mouth every 6 (six) hours as needed for moderate pain. Patient not taking: Reported on 09/09/2018 05/19/18   Nicholes Stairs, MD    Physical Exam: Vitals:   09/09/18 2030 09/09/18 2115 09/09/18 2245 09/09/18 2315  BP: (!) 169/85 (!) 175/92 (!) 175/92 (!) 178/91  Pulse: 100 (!) 103 (!) 104 (!) 106   Resp: 16 (!) 33 (!) 27 (!) 28  Temp:      TempSrc:      SpO2: 99% 95% 97% 92%  Weight:      Height:       General: Not in acute distress HEENT:       Eyes: PERRL, EOMI, no scleral icterus.       ENT: No discharge from the ears and nose, no pharynx injection, no tonsillar enlargement.        Neck: No JVD, no bruit, no mass felt. Heme: No neck lymph node enlargement. Cardiac: S1/S2, RRR, No murmurs, No gallops or rubs. Respiratory: Good air movement bilaterally. No rales, wheezing, rhonchi or rubs. GI: Soft, nondistended, nontender, no rebound pain, no organomegaly, BS present. GU: No hematuria Ext: has 1+ leg edema bilaterally. 2+DP/PT pulse bilaterally. Musculoskeletal: No joint deformities, No joint redness or warmth, no limitation of ROM in spin. Skin: No rashes.  Has bruises to left upper back and left leg Neuro: Alert, oriented X3, cranial nerves II-XII grossly intact, moves all extremities normally. Muscle strength 5/5 in all extremities,  sensation to light touch intact. Brachial reflex 2+ bilaterally. Negative Babinski's sign. Normal finger to nose test. Psych: Patient is not psychotic, no suicidal or hemocidal ideation.  Labs on Admission: I have personally reviewed following labs and imaging studies  CBC: Recent Labs  Lab 09/09/18 1440  WBC 14.5*  NEUTROABS 11.7*  HGB 11.4*  HCT 36.8  MCV 92.2  PLT 233   Basic Metabolic Panel: Recent Labs  Lab 09/09/18 1440  NA 138  K 3.3*  CL 99  CO2 25  GLUCOSE 210*  BUN 11  CREATININE 1.13*  CALCIUM 9.3   GFR: Estimated Creatinine Clearance: 56.5 mL/min (A) (by C-G formula based on SCr of 1.13 mg/dL (H)). Liver Function Tests: Recent Labs  Lab 09/09/18 1440  AST 18  ALT 15  ALKPHOS 94  BILITOT 1.8*  PROT 6.7  ALBUMIN 3.4*   No results for input(s): LIPASE, AMYLASE in the last 168 hours. No results for input(s): AMMONIA in the last 168 hours. Coagulation Profile: No results for input(s): INR, PROTIME in the  last 168 hours. Cardiac Enzymes: No results for input(s): CKTOTAL, CKMB, CKMBINDEX, TROPONINI in the last 168 hours. BNP (last 3 results) No results for input(s): PROBNP in the last 8760 hours. HbA1C: No results for input(s): HGBA1C in the last 72 hours. CBG: Recent Labs  Lab 09/09/18 1548 09/09/18 2355  GLUCAP 194* 184*   Lipid Profile: No results for input(s): CHOL, HDL, LDLCALC, TRIG, CHOLHDL, LDLDIRECT in the last 72 hours. Thyroid Function Tests: No results for input(s): TSH, T4TOTAL, FREET4, T3FREE, THYROIDAB in the last 72 hours. Anemia Panel: No results for input(s): VITAMINB12, FOLATE, FERRITIN, TIBC, IRON, RETICCTPCT in the last 72 hours. Urine analysis:    Component Value Date/Time   COLORURINE YELLOW 09/09/2018 [place] 09/09/2018 1542   LABSPEC 1.008 09/09/2018 1542   PHURINE 5.0 09/09/2018 1542   GLUCOSEU NEGATIVE 09/09/2018 1542   HGBUR SMALL (A) 09/09/2018 1542   BILIRUBINUR NEGATIVE 09/09/2018 1542   KETONESUR NEGATIVE 09/09/2018 1542   PROTEINUR 100 (A) 09/09/2018 1542   UROBILINOGEN 1.0 04/16/2011 0954   NITRITE NEGATIVE 09/09/2018 1542   LEUKOCYTESUR NEGATIVE 09/09/2018 1542   Sepsis Labs: @LABRCNTIP (procalcitonin:4,lacticidven:4) ) Recent Results (from the past 240 hour(s))  SARS Coronavirus 2 [facility] order, Performed in [facility] hospital lab) Nasopharyngeal Urine, Clean Catch     Status: None   Collection Time: 09/09/18  3:40 PM   Specimen: Urine, Clean Catch; Nasopharyngeal  Result Value Ref Range Status   SARS Coronavirus 2 NEGATIVE NEGATIVE Final    Comment: (NOTE) If result is NEGATIVE SARS-CoV-2 target nucleic acids are NOT DETECTED. The SARS-CoV-2 RNA is generally detectable in upper and lower  respiratory specimens during the acute phase of infection. The lowest  concentration of SARS-CoV-2 viral copies this assay can detect is 250  copies / mL. A negative result does not preclude SARS-CoV-2 infection  and should  not be used as the sole basis for treatment or other  patient management decisions.  A negative result may occur with  improper specimen collection / handling, submission of specimen other  than nasopharyngeal swab, presence of viral mutation(s) within the  areas targeted by this assay, and inadequate number of viral copies  (<250 copies / mL). A negative result must be combined with clinical  observations, patient history, and epidemiological information. If result is POSITIVE SARS-CoV-2 target nucleic acids are DETECTED. The SARS-CoV-2 RNA is generally detectable in upper and lower  respiratory specimens dur ing  the acute phase of infection.  Positive  results are indicative of active infection with SARS-CoV-2.  Clinical  correlation with patient history and other diagnostic information is  necessary to determine patient infection status.  Positive results do  not rule out bacterial infection or co-infection with other viruses. If result is PRESUMPTIVE POSTIVE SARS-CoV-2 nucleic acids MAY BE PRESENT.   A presumptive positive result was obtained on the submitted specimen  and confirmed on repeat testing.  While 2019 novel coronavirus  (SARS-CoV-2) nucleic acids may be present in the submitted sample  additional confirmatory testing may be necessary for epidemiological  and / or clinical management purposes  to differentiate between  SARS-CoV-2 and other Sarbecovirus currently known to infect humans.  If clinically indicated additional testing with an alternate test  methodology [id number]) is advised. The SARS-CoV-2 RNA is generally  detectable in upper and lower respiratory sp ecimens during the acute  phase of infection. The expected result is Negative. Fact Sheet for Patients:  StrictlyIdeas.no Fact Sheet for Healthcare Providers: BankingDealers.co.za This test is not yet approved or cleared by the [place] FDA and has been  authorized for detection and/or diagnosis of SARS-CoV-2 by FDA under an Emergency Use Authorization (EUA).  This EUA will remain in effect (meaning this test can be used) for the duration of the COVID-19 declaration under Section 564(b)(1) of the Act, 21 U.S.C. section 360bbb-3(b)(1), unless the authorization is terminated or revoked sooner. Performed at [facility], [place] [address] [place], [place] [address]      Radiological Exams on Admission: Dg Ribs Unilateral Left  Result Date: 09/09/2018 CLINICAL DATA:  Recent falls.  Left leg and rib pain. EXAM: LEFT RIBS - 2 VIEW COMPARISON:  Chest radiographs today.  Chest CT 04/03/2018. FINDINGS: Two views demonstrate no evidence of acute left-sided rib fracture or focal rib lesion. As noted on earlier radiographs, there is probable pulmonary edema with stable cardiomegaly. No significant pleural effusion or pneumothorax. There are postsurgical changes in the left upper quadrant of the abdomen. IMPRESSION: No evidence of left-sided rib fracture, pleural effusion or pneumothorax. Electronically Signed   By: Richardean Sale M.D.   On: 09/09/2018 18:20   Ct Head Wo Contrast  Result Date: 09/09/2018 CLINICAL DATA:  Head trauma. High clinical risk. Slurred speech. EXAM: CT HEAD WITHOUT CONTRAST TECHNIQUE: Contiguous axial images were obtained from the base of the skull through the vertex without intravenous contrast. COMPARISON:  March 29, 2018 FINDINGS: Brain: No evidence of acute hemorrhage, hydrocephalus, extra-axial collection or mass lesion/mass effect. Chronic right occipital lobe infarct. New areas of hypoattenuation in the subcortical white matter of the right frontal lobe may represent small age-indeterminate ischemic infarcts. Vascular: Calcific atherosclerotic disease of the intra cavernous carotid arteries. Skull: Normal. Negative for fracture or focal lesion. Sinuses/Orbits: Minimal opacification of the left mastoid air cells. Paranasal  sinuses are clear. Other: None. IMPRESSION: 1. New areas of hypoattenuation in the subcortical white matter of the right frontal lobe may represent small age-indeterminate ischemic infarcts. 2. Chronic right occipital lobe infarct. 3. No evidence of traumatic injury to the brain. Electronically Signed   By: Fidela Salisbury M.D.   On: 09/09/2018 17:29   Dg Chest Port 1 View  Result Date: 09/09/2018 CLINICAL DATA:  Shortness of breath multiple recent falls and slurred speech. History of diabetes and stroke. EXAM: PORTABLE CHEST 1 VIEW COMPARISON:  Radiographs 05/18/2018 and 04/05/2018.  CT 04/03/2018. FINDINGS: 1549 hours. Stable cardiomegaly post median sternotomy and CABG. Pulmonary  vascular congestion has slightly worsened, and there is probable mild pulmonary edema. No confluent airspace opacity, pleural effusion or pneumothorax. The bones appear intact. Telemetry leads overlie the chest. IMPRESSION: Cardiomegaly with increased vascular congestion and probable mild pulmonary edema. No confluent airspace opacity or pneumothorax. Electronically Signed   By: Richardean Sale M.D.   On: 09/09/2018 16:31   Dg Femur Min 2 Views Left  Result Date: 09/09/2018 CLINICAL DATA:  Recent falls.  Left thigh pain. EXAM: LEFT FEMUR 2 VIEWS COMPARISON:  Left hip radiographs 07/31/2018. FINDINGS: The mineralization and alignment are normal. There is no evidence of acute fracture or dislocation. Stable mild degenerative changes at the left hip and knee. There are scattered vascular calcifications and vascular clips. No unexpected foreign bodies. IMPRESSION: No evidence of acute fracture or dislocation. Electronically Signed   By: Richardean Sale M.D.   On: 09/09/2018 18:22     EKG: Independently reviewed.  Sinus rhythm, QTC 482, old left bundle blockade, poor R wave progression, RAD, LAE.  Assessment/Plan Principal Problem:   Slurred speech Active Problems:   Hyperlipemia   Essential hypertension   GERD   CAD  (coronary artery disease) of bypass graft   Atrial fibrillation (HCC)   Chronic systolic CHF (congestive heart failure) ([place])   Fall   Stroke [facility])   Type II diabetes mellitus with renal manifestations ([place])   CKD (chronic kidney disease), stage III ([place])   Sepsis ([place])   Depression with anxiety   Slurred speech and fall: likely due to stroke. CT-head showed new areas of hypoattenuation in the subcortical white matter of the right frontal lobe which may represent small age-indeterminate ischemic infarcts. Neuro, Dr. Lorraine Lax was consulted.  - will place on tele bed for obs - will follow up Neurology's Recs.  - Obtain MRI/MRA -->if positive for stroke, will need other stroke work up. - continue Eliquis and plavix - fasting lipid panel and HbA1c  - 2D transthoracic echocardiography  - swallowing screen. If fails, will get SLP - PT/OT consult  Hx of stroke: -see above  Hyperlipemia: -Crestor and Zetia -pt is also on Alirocumab injetion q14day  Essential hypertension: -hold amlodipine in the setting of possible stroke and also due to sepsis (pt at risk for developing hypotension) -IV hydralazine as needed for SBP>185.  GERD: -protonix  CAD (coronary artery disease) of bypass graft: s/p of stent. No CP. -Continue Plavix, Zetia, Crestor, ranolazine  Atrial Fibrillation: CHA2DS2-VASc Score is 8, needs oral anticoagulation. Patient is on Eliquis at home.  Heart rate is well controlled. -continue Eliquis -pt was on metoprolol which was d/c'ed on 6/75  Chronic systolic CHF (congestive heart failure) ([place]): 2D echo on 03/26/2018 showed EF 25-30%. Pt has 1+ leg edema and shortness of breath.  Chest x-ray showed cardiomegaly and vascular congestion, indicating mild fluid retention. -Patient was given 1 dose of IV Lasix 40 mg in ED -Continue home torsemide and metolazone -Avoid overdiuresis in the setting for possible stroke  Type II diabetes mellitus with renal manifestations ([place]):  Last A1c 6.9 on 05/16/18, well controled. Patient is taking NovoLog and Lantus at home -will decrease Lantus dose from 38 to 26 units daily -SSI  CKD (chronic kidney disease), stage III ([place]): stable.  Baseline creatinine 1.3-1.6.  Her creatinine is 1.13, BUN 11 -f/u by BMP  Sepsis [facility]): Patient has a fever and leukocytosis with WBC 14.5.  She meets criteria for sepsis with unclear source of infection.  Urinalysis negative.  Chest x-ray did not show  infiltration.  COVID-19 negative. -Start vancomycin and cefepime -Follow-up blood culture, urine culture  Depression with anxiety: no SI or HI -continue home Xanax, Wellbutrin, Cymbalta  Hypokalemia: K= 3.3 on admission. - Repleted - Check Mg level  Addendum: Nausea vomiting: Initially patient did not have GI symptoms.  She developed nausea ,vomiting for several times.  She denies any abdominal pain or diarrhea currently. -PRN Zofran for nausea -Give 1 dose of Reglan 10 mg x 1 -Observe closely   Inpatient status:  # Patient requires inpatient status due to high intensity of service, high risk for further deterioration and high frequency of surveillance required.  I certify that at the point of admission it is my clinical judgment that the patient will require inpatient hospital care spanning beyond 2 midnights from the point of admission.  This patient has multiple chronic comorbidities including hypertension, hyperlipidemia, diabetes mellitus, asthma, stroke, GERD, depression with anxiety, PVD, OSA not on CPAP, CAD, stent, CABG, sCHF with EF 25%, memory change, atrial fibrillation on Eliquis, left bundle blockage, CKD stage III. Marland Kitchen Now patient has presenting with slurred speech and fall which is possibly due to stroke; patient also has sepsis with unclear source of infection and CHF exacerbation. . The worrisome physical exam findings include bilateral leg edema . The initial radiographic and laboratory data are worrisome because CT-head  showed  new areas of hypoattenuation in the subcortical white matter of the right frontal lobe which may represent small age-indeterminate ischemic infarcts . Current medical needs: please see my assessment and plan . Predictability of an adverse outcome (risk): Patient has multiple complicated mobility, now presents with possible stroke, CHF exacerbation and sepsis with unclear source of infection.  Her presentation is highly complicated. She is at high risk for deteriorating.  Will need to be treated in hospital for at least 2 days.    DVT ppx: on Eliquis Code Status: Full code Family Communication: Yes, patient's husband at bed side Disposition Plan:  Anticipate discharge back to previous home environment Consults called:  Dr. Lorraine Lax Admission status:   Inpatient/tele  Date of Service 09/10/2018    [place]   If 7PM-7AM, please contact night-coverage [email or web address] Password [email or web address] 09/10/2018, 12:53 AM

## 2018-09-09 NOTE — Progress Notes (Signed)
Pharmacy Antibiotic Note  [name] [name] is a 73 y.o. female admitted on 09/09/2018 with sepsis.  Pharmacy has been consulted for cefepime and vancomycin dosing.  Has PCN allergy listed - has tolerated cefepime and cephalexin in the past. Presented with SOB, nausea, and cough.  WBC 14.5, temp 100.9, LA 1.2, Scr 1.13 (CrCl 56 mL/min).  Plan: Cefepime 2 g IV every 12 hours  Vancomycin 2g IV once then 1 g IV every 24 hours (estAUC 485) Monitor renal fx, clinical pic, cx results, and levels as appropriate  Height: 5' 2.5" (158.8 cm) Weight: 256 lb 3.2 oz (116.2 kg) IBW/kg (Calculated) : 51.25  Temp (24hrs), Avg:100.2 F (37.9 C), Min:99.4 F (37.4 C), Max:100.9 F (38.3 C)  Recent Labs  Lab 09/09/18 1440 09/09/18 1850  WBC 14.5*  --   CREATININE 1.13*  --   LATICACIDVEN  --  1.2    Estimated Creatinine Clearance: 56.5 mL/min (A) (by C-G formula based on SCr of 1.13 mg/dL (H)).    Allergies  Allergen Reactions  . Amoxicillin Shortness Of Breath and Rash  . Brilinta [Ticagrelor] Shortness Of Breath  . Erythromycin Shortness Of Breath, Other (See Comments) and Hives    Trouble swallowing  . Flagyl [Metronidazole] Shortness Of Breath and Palpitations  . Penicillins Hives, Shortness Of Breath, Rash and Other (See Comments)    Has patient had a PCN reaction causing immediate rash, facial/tongue/throat swelling, SOB or lightheadedness with hypotension: Yes Has patient had a PCN reaction causing severe rash involving mucus membranes or skin necrosis: No Has patient had a PCN reaction that required hospitalization: Yes Has patient had a PCN reaction occurring within the last 10 years: No If all of the above answers are "NO", then may proceed with Cephalosporin use.   . Isosorbide Mononitrate [Isosorbide Nitrate] Other (See Comments)    Joint aches, muscles hurt, difficult to walk   . Jardiance [Empagliflozin] Other (See Comments)    Nausea, joint aches, muscles aches  . Metformin  And Related Other (See Comments)    Stomach pain, cold sweats, joint pain, burred vision, dizziness  . Tape Other (See Comments)    Skin pulls off with certain types Plastic tape causes skin to rip if left on for long periods of time  . Erythromycin Base Rash    Antimicrobials this admission: Cefepime 8/8 >>  Vanc 8/8 >>   Dose adjustments this admission: N/A  Microbiology results: 8/8 BCx: sent 8/8 COVID PCR: neg  8/8 UCx: sent   Thank you for allowing pharmacy to be a part of this patient's care.  Antonietta Jewel, PharmD, [place] Clinical Pharmacist  Pager: [phone] Phone: [phone] 09/09/2018 8:31 PM

## 2018-09-09 NOTE — Progress Notes (Signed)
Attempted to get pt for MRI, RN stated pt needed to finish round of IV antiobiotic before coming over. RN to call when pt is ready.

## 2018-09-10 ENCOUNTER — Inpatient Hospital Stay (HOSPITAL_COMMUNITY): Payer: Medicare Other

## 2018-09-10 DIAGNOSIS — E1129 Type 2 diabetes mellitus with other diabetic kidney complication: Secondary | ICD-10-CM

## 2018-09-10 DIAGNOSIS — I639 Cerebral infarction, unspecified: Secondary | ICD-10-CM

## 2018-09-10 DIAGNOSIS — Z794 Long term (current) use of insulin: Secondary | ICD-10-CM

## 2018-09-10 DIAGNOSIS — I480 Paroxysmal atrial fibrillation: Secondary | ICD-10-CM

## 2018-09-10 DIAGNOSIS — J181 Lobar pneumonia, unspecified organism: Secondary | ICD-10-CM

## 2018-09-10 DIAGNOSIS — I5021 Acute systolic (congestive) heart failure: Secondary | ICD-10-CM

## 2018-09-10 DIAGNOSIS — R4781 Slurred speech: Secondary | ICD-10-CM

## 2018-09-10 LAB — COMPREHENSIVE METABOLIC PANEL
ALT: 15 U/L (ref 0–44)
AST: 17 U/L (ref 15–41)
Albumin: 3.2 g/dL — ABNORMAL LOW (ref 3.5–5.0)
Alkaline Phosphatase: 86 U/L (ref 38–126)
Anion gap: 16 — ABNORMAL HIGH (ref 5–15)
BUN: 12 mg/dL (ref 8–23)
CO2: 29 mmol/L (ref 22–32)
Calcium: 9 mg/dL (ref 8.9–10.3)
Chloride: 94 mmol/L — ABNORMAL LOW (ref 98–111)
Creatinine, Ser: 1.42 mg/dL — ABNORMAL HIGH (ref 0.44–1.00)
GFR calc Af Amer: 43 mL/min — ABNORMAL LOW (ref >60)
GFR calc non Af Amer: 37 mL/min — ABNORMAL LOW (ref >60)
Glucose, Bld: 306 mg/dL — ABNORMAL HIGH (ref 70–99)
Potassium: 3.2 mmol/L — ABNORMAL LOW (ref 3.5–5.1)
Sodium: 139 mmol/L (ref 135–145)
Total Bilirubin: 2.3 mg/dL — ABNORMAL HIGH (ref 0.3–1.2)
Total Protein: 6.7 g/dL (ref 6.5–8.1)

## 2018-09-10 LAB — BLOOD GAS, ARTERIAL
Acid-Base Excess: 8.2 mmol/L — ABNORMAL HIGH (ref 0.0–2.0)
Bicarbonate: 32.6 mmol/L — ABNORMAL HIGH (ref 20.0–28.0)
Drawn by: 283381
FIO2: 40
O2 Content: 5 L/min
O2 Saturation: 89.7 %
Patient temperature: 98.7
pCO2 arterial: 49.4 mmHg — ABNORMAL HIGH (ref 32.0–48.0)
pH, Arterial: 7.435 (ref 7.350–7.450)
pO2, Arterial: 58.4 mmHg — ABNORMAL LOW (ref 83.0–108.0)

## 2018-09-10 LAB — PROCALCITONIN: Procalcitonin: <0.1 ng/mL

## 2018-09-10 LAB — URINE CULTURE: Culture: NO GROWTH

## 2018-09-10 LAB — CBC
HCT: 37.6 % (ref 36.0–46.0)
Hemoglobin: 11.7 g/dL — ABNORMAL LOW (ref 12.0–15.0)
MCH: 28.6 pg (ref 26.0–34.0)
MCHC: 31.1 g/dL (ref 30.0–36.0)
MCV: 91.9 fL (ref 80.0–100.0)
Platelets: 233 10*3/uL (ref 150–400)
RBC: 4.09 MIL/uL (ref 3.87–5.11)
RDW: 15 % (ref 11.5–15.5)
WBC: 22.1 10*3/uL — ABNORMAL HIGH (ref 4.0–10.5)
nRBC: 0 % (ref 0.0–0.2)

## 2018-09-10 LAB — RESPIRATORY PANEL BY PCR

## 2018-09-10 LAB — MAGNESIUM: Magnesium: 2.4 mg/dL (ref 1.7–2.4)

## 2018-09-10 LAB — GLUCOSE, CAPILLARY
Glucose-Capillary: 293 mg/dL — ABNORMAL HIGH (ref 70–99)
Glucose-Capillary: 279 mg/dL — ABNORMAL HIGH (ref 70–99)
Glucose-Capillary: 249 mg/dL — ABNORMAL HIGH (ref 70–99)

## 2018-09-10 LAB — HEMOGLOBIN A1C
Hgb A1c MFr Bld: 7 % — ABNORMAL HIGH (ref 4.8–5.6)
Mean Plasma Glucose: 154.2 mg/dL

## 2018-09-10 LAB — LIPID PANEL
Cholesterol: 103 mg/dL (ref 0–200)
HDL: 37 mg/dL — ABNORMAL LOW (ref >40)
LDL Cholesterol: 48 mg/dL (ref 0–99)
Total CHOL/HDL Ratio: 2.8 RATIO
Triglycerides: 90 mg/dL (ref <150)
VLDL: 18 mg/dL (ref 0–40)

## 2018-09-10 LAB — CBG MONITORING, ED: Glucose-Capillary: 228 mg/dL — ABNORMAL HIGH (ref 70–99)

## 2018-09-10 LAB — MRSA PCR SCREENING: MRSA by PCR: POSITIVE — AB

## 2018-09-10 LAB — BRAIN NATRIURETIC PEPTIDE: B Natriuretic Peptide: >4500 pg/mL — ABNORMAL HIGH (ref 0.0–100.0)

## 2018-09-10 LAB — LACTIC ACID, PLASMA: Lactic Acid, Venous: 2.1 mmol/L (ref 0.5–1.9)

## 2018-09-10 MED ORDER — PROCHLORPERAZINE EDISYLATE 10 MG/2ML IJ SOLN
10.0000 mg | Freq: Once | INTRAMUSCULAR | Status: AC
  Administered 2018-09-10: 10 mg via INTRAVENOUS
  Filled 2018-09-10: qty 2

## 2018-09-10 MED ORDER — ACETAMINOPHEN 650 MG RE SUPP: 650.0000 mg | RECTAL | Status: DC | PRN

## 2018-09-10 MED ORDER — ONDANSETRON HCL 4 MG/2ML IJ SOLN: 4.0000 mg | Freq: Three times a day (TID) | INTRAMUSCULAR | Status: DC | PRN

## 2018-09-10 MED ORDER — IPRATROPIUM-ALBUTEROL 0.5-2.5 (3) MG/3ML IN SOLN
RESPIRATORY_TRACT | Status: AC
  Filled 2018-09-10: qty 3

## 2018-09-10 MED ORDER — ROSUVASTATIN CALCIUM 20 MG PO TABS
40.0000 mg | ORAL_TABLET | Freq: Every day | ORAL | Status: DC
  Administered 2018-09-10 – 2018-09-12 (×3): 40 mg via ORAL
  Filled 2018-09-10 (×3): qty 2

## 2018-09-10 MED ORDER — EZETIMIBE 10 MG PO TABS
10.0000 mg | ORAL_TABLET | Freq: Every day | ORAL | Status: DC
  Administered 2018-09-10 – 2018-09-12 (×3): 10 mg via ORAL
  Filled 2018-09-10 (×3): qty 1

## 2018-09-10 MED ORDER — DULOXETINE HCL 30 MG PO CPEP
30.0000 mg | ORAL_CAPSULE | Freq: Every day | ORAL | Status: DC
  Administered 2018-09-10 – 2018-09-13 (×4): 30 mg via ORAL
  Filled 2018-09-10 (×4): qty 1

## 2018-09-10 MED ORDER — MOMETASONE FURO-FORMOTEROL FUM 200-5 MCG/ACT IN AERO
2.0000 | INHALATION_SPRAY | Freq: Two times a day (BID) | RESPIRATORY_TRACT | Status: DC
  Administered 2018-09-10 – 2018-09-13 (×6): 2 via RESPIRATORY_TRACT
  Filled 2018-09-10: qty 8.8

## 2018-09-10 MED ORDER — CLOPIDOGREL BISULFATE 75 MG PO TABS
75.0000 mg | ORAL_TABLET | Freq: Every day | ORAL | Status: DC
  Administered 2018-09-10 – 2018-09-12 (×3): 75 mg via ORAL
  Filled 2018-09-10 (×3): qty 1

## 2018-09-10 MED ORDER — MUPIROCIN 2 % EX OINT
1.0000 "application " | TOPICAL_OINTMENT | Freq: Two times a day (BID) | CUTANEOUS | Status: DC
  Administered 2018-09-10 – 2018-09-13 (×6): 1 via NASAL
  Filled 2018-09-10 (×2): qty 22

## 2018-09-10 MED ORDER — METOPROLOL SUCCINATE ER 25 MG PO TB24
50.0000 mg | ORAL_TABLET | Freq: Every day | ORAL | Status: DC
  Administered 2018-09-10 – 2018-09-13 (×4): 50 mg via ORAL
  Filled 2018-09-10 (×4): qty 2

## 2018-09-10 MED ORDER — PANTOPRAZOLE SODIUM 40 MG PO TBEC
40.0000 mg | DELAYED_RELEASE_TABLET | Freq: Every day | ORAL | Status: DC
  Administered 2018-09-10 – 2018-09-13 (×4): 40 mg via ORAL
  Filled 2018-09-10 (×4): qty 1

## 2018-09-10 MED ORDER — GABAPENTIN 300 MG PO CAPS
600.0000 mg | ORAL_CAPSULE | Freq: Two times a day (BID) | ORAL | Status: DC
  Administered 2018-09-10 – 2018-09-13 (×7): 600 mg via ORAL
  Filled 2018-09-10 (×7): qty 2

## 2018-09-10 MED ORDER — LORAZEPAM 2 MG/ML IJ SOLN
0.5000 mg | Freq: Once | INTRAMUSCULAR | Status: AC
  Administered 2018-09-10: 0.5 mg via INTRAVENOUS

## 2018-09-10 MED ORDER — POTASSIUM CHLORIDE CRYS ER 20 MEQ PO TBCR
40.0000 meq | EXTENDED_RELEASE_TABLET | Freq: Once | ORAL | Status: AC
  Administered 2018-09-10: 40 meq via ORAL
  Filled 2018-09-10: qty 2

## 2018-09-10 MED ORDER — INSULIN ASPART 100 UNIT/ML ~~LOC~~ SOLN
0.0000 [IU] | Freq: Three times a day (TID) | SUBCUTANEOUS | Status: DC
  Administered 2018-09-10 – 2018-09-11 (×3): 5 [IU] via SUBCUTANEOUS
  Administered 2018-09-11: 7 [IU] via SUBCUTANEOUS
  Administered 2018-09-11: 2 [IU] via SUBCUTANEOUS
  Administered 2018-09-12: 3 [IU] via SUBCUTANEOUS
  Administered 2018-09-12: 5 [IU] via SUBCUTANEOUS
  Administered 2018-09-12 – 2018-09-13 (×2): 3 [IU] via SUBCUTANEOUS
  Administered 2018-09-13: 2 [IU] via SUBCUTANEOUS

## 2018-09-10 MED ORDER — MAGNESIUM SULFATE 2 GM/50ML IV SOLN
2.0000 g | Freq: Once | INTRAVENOUS | Status: AC
  Administered 2018-09-10: 2 g via INTRAVENOUS
  Filled 2018-09-10: qty 50

## 2018-09-10 MED ORDER — OXYCODONE HCL 5 MG PO TABS: 5.0000 mg | ORAL_TABLET | Freq: Four times a day (QID) | ORAL | Status: DC | PRN

## 2018-09-10 MED ORDER — VITAMIN B-12 1000 MCG PO TABS
1000.0000 ug | ORAL_TABLET | Freq: Every day | ORAL | Status: DC
  Administered 2018-09-10 – 2018-09-13 (×4): 1000 ug via ORAL
  Filled 2018-09-10 (×4): qty 1

## 2018-09-10 MED ORDER — ONDANSETRON HCL 4 MG/2ML IJ SOLN
4.0000 mg | Freq: Three times a day (TID) | INTRAMUSCULAR | Status: DC | PRN
  Administered 2018-09-10: 4 mg via INTRAVENOUS
  Filled 2018-09-10: qty 2

## 2018-09-10 MED ORDER — METOCLOPRAMIDE HCL 5 MG/ML IJ SOLN
10.0000 mg | Freq: Once | INTRAMUSCULAR | Status: AC
  Administered 2018-09-10: 10 mg via INTRAVENOUS
  Filled 2018-09-10: qty 2

## 2018-09-10 MED ORDER — LORAZEPAM 2 MG/ML IJ SOLN
INTRAMUSCULAR | Status: AC
  Filled 2018-09-10: qty 1

## 2018-09-10 MED ORDER — PROSIGHT PO TABS
1.0000 | ORAL_TABLET | Freq: Every day | ORAL | Status: DC
  Administered 2018-09-10 – 2018-09-13 (×4): 1 via ORAL
  Filled 2018-09-10 (×4): qty 1

## 2018-09-10 MED ORDER — ZOLPIDEM TARTRATE 5 MG PO TABS
5.0000 mg | ORAL_TABLET | Freq: Every day | ORAL | Status: DC
  Administered 2018-09-10 – 2018-09-12 (×3): 5 mg via ORAL
  Filled 2018-09-10 (×3): qty 1

## 2018-09-10 MED ORDER — IPRATROPIUM-ALBUTEROL 0.5-2.5 (3) MG/3ML IN SOLN: 3.0000 mL | Freq: Two times a day (BID) | RESPIRATORY_TRACT | Status: DC | PRN

## 2018-09-10 MED ORDER — BUDESONIDE 0.25 MG/2ML IN SUSP
0.2500 mg | Freq: Two times a day (BID) | RESPIRATORY_TRACT | Status: DC
  Administered 2018-09-10 – 2018-09-13 (×5): 0.25 mg via RESPIRATORY_TRACT
  Filled 2018-09-10 (×6): qty 2

## 2018-09-10 MED ORDER — ALBUTEROL SULFATE HFA 108 (90 BASE) MCG/ACT IN AERS: 2.0000 | INHALATION_SPRAY | RESPIRATORY_TRACT | Status: DC | PRN

## 2018-09-10 MED ORDER — AMLODIPINE BESYLATE 10 MG PO TABS
10.0000 mg | ORAL_TABLET | Freq: Every day | ORAL | Status: DC
  Administered 2018-09-10 – 2018-09-13 (×4): 10 mg via ORAL
  Filled 2018-09-10 (×4): qty 1

## 2018-09-10 MED ORDER — METOCLOPRAMIDE HCL 5 MG/ML IJ SOLN: 10.0000 mg | Freq: Two times a day (BID) | INTRAMUSCULAR | Status: DC | PRN

## 2018-09-10 MED ORDER — APIXABAN 5 MG PO TABS
5.0000 mg | ORAL_TABLET | Freq: Two times a day (BID) | ORAL | Status: DC
  Administered 2018-09-10 – 2018-09-13 (×7): 5 mg via ORAL
  Filled 2018-09-10 (×7): qty 1

## 2018-09-10 MED ORDER — NITROGLYCERIN 0.3 MG SL SUBL
0.3000 mg | SUBLINGUAL_TABLET | SUBLINGUAL | Status: DC | PRN
  Filled 2018-09-10: qty 100

## 2018-09-10 MED ORDER — CHLORHEXIDINE GLUCONATE CLOTH 2 % EX PADS
6.0000 | MEDICATED_PAD | Freq: Every day | CUTANEOUS | Status: DC
  Administered 2018-09-11 – 2018-09-13 (×3): 6 via TOPICAL

## 2018-09-10 MED ORDER — ACETAMINOPHEN 325 MG PO TABS: 650.0000 mg | ORAL_TABLET | ORAL | Status: DC | PRN

## 2018-09-10 MED ORDER — INSULIN GLARGINE 100 UNIT/ML ~~LOC~~ SOLN
26.0000 [IU] | Freq: Every day | SUBCUTANEOUS | Status: DC
  Administered 2018-09-10 – 2018-09-11 (×2): 26 [IU] via SUBCUTANEOUS
  Filled 2018-09-10 (×3): qty 0.26

## 2018-09-10 MED ORDER — STROKE: EARLY STAGES OF RECOVERY BOOK
Freq: Once | Status: AC
  Administered 2018-09-10: 11:00:00
  Filled 2018-09-10: qty 1

## 2018-09-10 MED ORDER — METOLAZONE 2.5 MG PO TABS
2.5000 mg | ORAL_TABLET | Freq: Every day | ORAL | Status: DC
  Administered 2018-09-10 – 2018-09-13 (×4): 2.5 mg via ORAL
  Filled 2018-09-10 (×4): qty 1

## 2018-09-10 MED ORDER — TORSEMIDE 20 MG PO TABS: 20.0000 mg | ORAL_TABLET | ORAL | Status: DC

## 2018-09-10 MED ORDER — ALBUTEROL SULFATE (2.5 MG/3ML) 0.083% IN NEBU: 2.5000 mg | INHALATION_SOLUTION | RESPIRATORY_TRACT | Status: DC | PRN

## 2018-09-10 MED ORDER — FUROSEMIDE 10 MG/ML IJ SOLN
80.0000 mg | Freq: Once | INTRAMUSCULAR | Status: AC
  Administered 2018-09-10: 80 mg via INTRAVENOUS
  Filled 2018-09-10: qty 8

## 2018-09-10 MED ORDER — HYDRALAZINE HCL 20 MG/ML IJ SOLN
5.0000 mg | INTRAMUSCULAR | Status: DC | PRN
  Administered 2018-09-10: 5 mg via INTRAVENOUS
  Filled 2018-09-10: qty 1

## 2018-09-10 MED ORDER — APIXABAN 5 MG PO TABS: 5.0000 mg | ORAL_TABLET | Freq: Two times a day (BID) | ORAL | Status: DC

## 2018-09-10 MED ORDER — ACETAMINOPHEN 160 MG/5ML PO SOLN: 650.0000 mg | ORAL | Status: DC | PRN

## 2018-09-10 MED ORDER — ALPRAZOLAM 0.5 MG PO TABS
0.5000 mg | ORAL_TABLET | Freq: Two times a day (BID) | ORAL | Status: DC
  Administered 2018-09-10 – 2018-09-13 (×7): 0.5 mg via ORAL
  Filled 2018-09-10 (×5): qty 1
  Filled 2018-09-10: qty 2
  Filled 2018-09-10: qty 1

## 2018-09-10 MED ORDER — TRIAMCINOLONE ACETONIDE 0.1 % EX CREA
1.0000 "application " | TOPICAL_CREAM | Freq: Every day | CUTANEOUS | Status: DC
  Administered 2018-09-10 – 2018-09-13 (×4): 1 via TOPICAL
  Filled 2018-09-10: qty 15

## 2018-09-10 MED ORDER — RANOLAZINE ER 500 MG PO TB12
1000.0000 mg | ORAL_TABLET | Freq: Two times a day (BID) | ORAL | Status: DC
  Administered 2018-09-10 – 2018-09-13 (×7): 1000 mg via ORAL
  Filled 2018-09-10 (×7): qty 2

## 2018-09-10 MED ORDER — BUPROPION HCL ER (XL) 150 MG PO TB24
150.0000 mg | ORAL_TABLET | Freq: Every day | ORAL | Status: DC
  Administered 2018-09-10 – 2018-09-13 (×4): 150 mg via ORAL
  Filled 2018-09-10 (×4): qty 1

## 2018-09-10 MED ORDER — DM-GUAIFENESIN ER 30-600 MG PO TB12
1.0000 | ORAL_TABLET | Freq: Two times a day (BID) | ORAL | Status: DC | PRN
  Filled 2018-09-10: qty 1

## 2018-09-10 MED ORDER — BISACODYL 5 MG PO TBEC: 5.0000 mg | DELAYED_RELEASE_TABLET | Freq: Every day | ORAL | Status: DC | PRN

## 2018-09-10 NOTE — ED Notes (Signed)
Pt up tio bedside commode to have a bm.  alrge amouont of hard balls of brown stool

## 2018-09-10 NOTE — ED Notes (Signed)
Pt vomiting on returne from [facility]

## 2018-09-10 NOTE — ED Notes (Signed)
Iv infusing without swelling and pt denies pain or discomfort.

## 2018-09-10 NOTE — Consult Note (Signed)
Requesting Physician: Dr. Jeanell Sparrow    Chief Complaint: headache, shortness of breath  History obtained from: Patient and Chart    HPI:                                                                                                                                       [name] [name] is a 73 y.o. female past medical history significant for prior CVA, hypertension, diabetes, hyperlipidemia, coronary artery disease, congestive heart failure with a EF of 25% A. fib Eliquis, CKD. PVD presents to the emergency room with multiple complaints: Weakness, dizziness headache, slurred speech nausea, shortness of breath, multiple falls that have been going on for last few days.  Regarding her headache patient states that she noticed that this morning upon waking up.  Her headache is a dull headache associated with mild photophobia.  She states that her nausea vomiting has been going on for several days and is actually better today.  She has a history of migraines however feels this is not like her typical migraine  Work-up in the emergency room revealed patient had leukocytosis.  Chest x-ray revealed mild pulmonary edema.  A CT head was performed which showed subacute right frontal lobe infarct which was new from CT done in February 2020.  She has a chronic right PCA infarct   Date last known well: 3-4 days ago  tPA Given: No, outside window, unclear if her presentation is due to stroke    Past Medical History:  Diagnosis Date  . Abnormality of gait 05/14/2014  . Anemia    hx  . Anginal pain ([place])   . Anxiety   . Asthma   . Basal cell carcinoma 05/2014   "left shoulder"  . Bundle branch block, left    chronic/notes 07/18/2013  . CHF (congestive heart failure) ([place])   . Chronic bronchitis ([place])    "off and on all the time" (07/18/2013)  . Chronic insomnia 05/06/2015  . Chronic kidney disease    frequency, sees dr Jamal Maes every 4 to 6 months (01/16/2018)  . Chronic low back pain 08/24/2016  .  Chronic lower back pain   . Claustrophobia   . Common migraine 05/14/2014  . Coronary artery disease   . Depression   . Dysrhythmia   . GERD (gastroesophageal reflux disease)   . H/O hiatal hernia   . Headache    "at least 2/month" (01/16/2018)  . Heart murmur   . Hyperlipidemia   . Hypertension   . Irregular heart beat   . Leg cramps    both legs at times  . Melanoma ([place]) 05/2014   "burned off BLE" (01/16/2018)  . Memory change 05/14/2014  . Migraine    "5-6/year"  (01/16/2018)  . Myocardial infarction ([place]) 04/1999, 02/2000, 01/2005; 2011; 2014  . Neuromuscular disorder ([place])    ?  . Obesity 01-2010  . Obstructive sleep apnea    "  can't wear machine; I have claustrophobia" (01/16/2018), states she had a 2nd sleep study and does not have sleep apnea, her O2 decreases and now is on 2 L of O2 at night.  . On home oxygen therapy    "2L at night and prn during daytime" (01/16/2018)  . Osteoarthritis    "knees and hands" (01/16/2018)  . Other and unspecified angina pectoris   . Peripheral vascular disease (HCC)    ? numbness, tingling arms and legs  . Pneumonia 2000's   "once"  . PONV (postoperative nausea and vomiting)   . Shortness of breath    with exertion  . Stroke ([place]) 03/22/12   right side brain; denies residual on 07/18/2013)  . Stroke [facility]) Oct. 2015   Affected pt.s balance  . Stroke [facility]) 10/2014   "affected my legs; fully recovered now"; still have sporatic memory issues from this one" (01/16/2018)  . Type II diabetes mellitus ([place])   . Ventral hernia    hx of  . Vomiting    persistent  . Vomiting blood     Past Surgical History:  Procedure Laterality Date  . ANKLE FRACTURE SURGERY Left 1970's  . APPENDECTOMY  1970's   w/hysterectomy  . BASAL CELL CARCINOMA EXCISION Left 05/2014   "shoulder" (01/16/2018)  . CARDIAC CATHETERIZATION  10/10/2012   Dr [name].  Marland Kitchen CARDIAC CATHETERIZATION N/A 05/29/2015   Procedure: Right/Left Heart Cath and Coronary/Graft  Angiography;  Surgeon: Larey Dresser, MD;  Location: [facility];  Service: Cardiovascular;  Laterality: N/A;  . CAROTID ENDARTERECTOMY Left 03/2013  . CAROTID STENT INSERTION Left 03/20/2013   Procedure: CAROTID STENT INSERTION;  Surgeon: [name], MD;  Location: [facility] CATH LAB;  Service: Cardiovascular;  Laterality: Left;  internal carotid  . CEREBRAL ANGIOGRAM N/A 04/05/2011   Procedure: CEREBRAL ANGIOGRAM;  Surgeon: Angelia Mould, MD;  Location: [facility] CATH LAB;  Service: Cardiovascular;  Laterality: N/A;  . CHOLECYSTECTOMY OPEN  2004  . CORONARY ANGIOPLASTY WITH STENT PLACEMENT  01,02,05,06,07,08,11; 04/24/2013   "I've probably got ~ 10 stents by now" (04/24/2013)  . CORONARY ANGIOPLASTY WITH STENT PLACEMENT  06/13/2013   "got 4 stents today" (06/13/2013)  . CORONARY ARTERY BYPASS GRAFT  1220/11   "CABG X5"  . CORONARY STENT INTERVENTION N/A 10/20/2017   Procedure: CORONARY STENT INTERVENTION;  Surgeon: Troy Sine, MD;  Location: [facility];  Service: Cardiovascular;  Laterality: N/A;  . ESOPHAGOGASTRODUODENOSCOPY  08/03/2011   Procedure: ESOPHAGOGASTRODUODENOSCOPY (EGD);  Surgeon: Shann Medal, MD;  Location: [facility];  Service: General;  Laterality: N/A;  . ESOPHAGOGASTRODUODENOSCOPY (EGD) WITH PROPOFOL N/A 03/11/2014   Procedure: ESOPHAGOGASTRODUODENOSCOPY (EGD) WITH PROPOFOL;  Surgeon: Lafayette Dragon, MD;  Location: [facility]OS[facility];  Service: Endoscopy;  Laterality: N/A;  . FRACTURE SURGERY    . gall stone removal  05/2003  . GASTRIC RESTRICTION SURGERY  1984   "stapeling"  . HERNIA REPAIR  2004   "in my stomach; had OR on it twice", wire mesh on 1 hernia  . LEFT HEART CATH AND CORS/GRAFTS ANGIOGRAPHY N/A 07/09/2016   Procedure: LEFT HEART CATH AND CORS/GRAFTS ANGIOGRAPHY;  Surgeon: Larey Dresser, MD;  Location: [facility];  Service: Cardiovascular;  Laterality: N/A;  . ORIF ANKLE FRACTURE Left 05/16/2018  . ORIF ANKLE FRACTURE Left 05/16/2018    Procedure: OPEN REDUCTION INTERNAL FIXATION (ORIF) Left ankle with possible syndesmosis fixation;  Surgeon: Nicholes Stairs, MD;  Location: [facility];  Service: Orthopedics;  Laterality:  Left;  144min  . OVARY SURGERY  1970's   "tumor removed"  . PERCUTANEOUS CORONARY STENT INTERVENTION (PCI-S) N/A 06/13/2013   Procedure: PERCUTANEOUS CORONARY STENT INTERVENTION (PCI-S);  Surgeon: [name], MD;  Location: [facility] CATH LAB;  Service: Cardiovascular;  Laterality: N/A;  . PERCUTANEOUS STENT INTERVENTION N/A 04/24/2013   Procedure: PERCUTANEOUS STENT INTERVENTION;  Surgeon: [name], MD;  Location: [facility] CATH LAB;  Service: Cardiovascular;  Laterality: N/A;  . RIGHT/LEFT HEART CATH AND CORONARY/GRAFT ANGIOGRAPHY N/A 10/20/2017   Procedure: RIGHT/LEFT HEART CATH AND CORONARY/GRAFT ANGIOGRAPHY;  Surgeon: Larey Dresser, MD;  Location: [facility];  Service: Cardiovascular;  Laterality: N/A;  . ROOT CANAL  10/2000  . TIBIA FRACTURE SURGERY Right 1970's   rods and pins  . TOOTH EXTRACTION     "1 on the upper; wisdom tooth on the lower" (01/16/2018)  . TOTAL ABDOMINAL HYSTERECTOMY  1970's   w/ appendectomy    Family History  Problem Relation Age of Onset  . Heart disease Father        Heart Disease before age 62  . Diabetes Father   . Hyperlipidemia Father   . Hypertension Father   . Heart attack Father   . Deep vein thrombosis Father   . AAA (abdominal aortic aneurysm) Father   . Hypertension Mother   . Deep vein thrombosis Mother   . AAA (abdominal aortic aneurysm) Mother   . Cancer Sister        Ovarian  . Hypertension Sister   . Diabetes Paternal Grandmother   . Heart disease Paternal Uncle   . Diabetes Paternal Uncle   . Heart attack Paternal Grandfather 53       died of MI at 24  . Diabetes Paternal Aunt   . Diabetes Paternal Uncle   . Colon cancer Neg Hx    Social History:  reports that she has never smoked. She has never used smokeless tobacco. She  reports that she does not drink alcohol or use drugs.  Allergies:  Allergies  Allergen Reactions  . Amoxicillin Shortness Of Breath and Rash  . Brilinta [Ticagrelor] Shortness Of Breath  . Erythromycin Shortness Of Breath, Other (See Comments) and Hives    Trouble swallowing  . Flagyl [Metronidazole] Shortness Of Breath and Palpitations  . Penicillins Hives, Shortness Of Breath, Rash and Other (See Comments)    Has patient had a PCN reaction causing immediate rash, facial/tongue/throat swelling, SOB or lightheadedness with hypotension: Yes Has patient had a PCN reaction causing severe rash involving mucus membranes or skin necrosis: No Has patient had a PCN reaction that required hospitalization: Yes Has patient had a PCN reaction occurring within the last 10 years: No If all of the above answers are "NO", then may proceed with Cephalosporin use.   . Isosorbide Mononitrate [Isosorbide Nitrate] Other (See Comments)    Joint aches, muscles hurt, difficult to walk   . Jardiance [Empagliflozin] Other (See Comments)    Nausea, joint aches, muscles aches  . Metformin And Related Other (See Comments)    Stomach pain, cold sweats, joint pain, burred vision, dizziness  . Tape Other (See Comments)    Skin pulls off with certain types Plastic tape causes skin to rip if left on for long periods of time  . Erythromycin Base Rash    Medications:  I reviewed home medications   ROS:                                                                                                                                     14 systems reviewed and negative except above   Examination:                                                                                                      General: Appears well-developed  Psych: Affect appropriate to situation Eyes: No scleral injection HENT:  No OP obstrucion Head: Normocephalic.  Cardiovascular: Normal rate and regular rhythm. Respiratory: Effort normal and breath sounds normal to anterior ascultation GI: Soft.  No distension. There is no tenderness.  Skin: WDI    Neurological Examination Mental Status: Alert, oriented, thought content appropriate.  Speech fluent without evidence of aphasia. Able to follow 3 step commands without difficulty. Cranial Nerves: II: Visual fields: Left inferior quadrantanopsia III,IV, VI: ptosis not present, extra-ocular motions intact bilaterally, pupils equal, round, reactive to light and accommodation V,VII: smile symmetric, facial light touch sensation normal bilaterally VIII: hearing normal bilaterally IX,X: uvula rises symmetrically XI: bilateral shoulder shrug XII: midline tongue extension Motor: Right : Upper extremity   5/5    Left:     Upper extremity   5/5  Lower extremity   5/5     Lower extremity   5/5 Tone and bulk:normal tone throughout; no atrophy noted Sensory: Pinprick and light touch intact throughout, bilaterally Plantars: Right: downgoing   Left: downgoing Cerebellar: normal finger-to-nose, normal rapid alternating movements Gait: Not assessed     Lab Results: Basic Metabolic Panel: Recent Labs  Lab 09/09/18 1440  NA 138  K 3.3*  CL 99  CO2 25  GLUCOSE 210*  BUN 11  CREATININE 1.13*  CALCIUM 9.3    CBC: Recent Labs  Lab 09/09/18 1440  WBC 14.5*  NEUTROABS 11.7*  HGB 11.4*  HCT 36.8  MCV 92.2  PLT 237    Coagulation Studies: No results for input(s): LABPROT, INR in the last 72 hours.  Imaging: Dg Ribs Unilateral Left  Result Date: 09/09/2018 CLINICAL DATA:  Recent falls.  Left leg and rib pain. EXAM: LEFT RIBS - 2 VIEW COMPARISON:  Chest radiographs today.  Chest CT 04/03/2018. FINDINGS: Two views demonstrate no evidence of acute left-sided rib fracture or focal rib lesion. As noted on earlier radiographs, there is probable pulmonary edema  with stable cardiomegaly. No significant pleural effusion or pneumothorax. There  are postsurgical changes in the left upper quadrant of the abdomen. IMPRESSION: No evidence of left-sided rib fracture, pleural effusion or pneumothorax. Electronically Signed   By: Richardean Sale M.D.   On: 09/09/2018 18:20   Ct Head Wo Contrast  Result Date: 09/09/2018 CLINICAL DATA:  Head trauma. High clinical risk. Slurred speech. EXAM: CT HEAD WITHOUT CONTRAST TECHNIQUE: Contiguous axial images were obtained from the base of the skull through the vertex without intravenous contrast. COMPARISON:  March 29, 2018 FINDINGS: Brain: No evidence of acute hemorrhage, hydrocephalus, extra-axial collection or mass lesion/mass effect. Chronic right occipital lobe infarct. New areas of hypoattenuation in the subcortical white matter of the right frontal lobe may represent small age-indeterminate ischemic infarcts. Vascular: Calcific atherosclerotic disease of the intra cavernous carotid arteries. Skull: Normal. Negative for fracture or focal lesion. Sinuses/Orbits: Minimal opacification of the left mastoid air cells. Paranasal sinuses are clear. Other: None. IMPRESSION: 1. New areas of hypoattenuation in the subcortical white matter of the right frontal lobe may represent small age-indeterminate ischemic infarcts. 2. Chronic right occipital lobe infarct. 3. No evidence of traumatic injury to the brain. Electronically Signed   By: Fidela Salisbury M.D.   On: 09/09/2018 17:29   Dg Chest Port 1 View  Result Date: 09/09/2018 CLINICAL DATA:  Shortness of breath multiple recent falls and slurred speech. History of diabetes and stroke. EXAM: PORTABLE CHEST 1 VIEW COMPARISON:  Radiographs 05/18/2018 and 04/05/2018.  CT 04/03/2018. FINDINGS: 1549 hours. Stable cardiomegaly post median sternotomy and CABG. Pulmonary vascular congestion has slightly worsened, and there is probable mild pulmonary edema. No confluent airspace opacity, pleural  effusion or pneumothorax. The bones appear intact. Telemetry leads overlie the chest. IMPRESSION: Cardiomegaly with increased vascular congestion and probable mild pulmonary edema. No confluent airspace opacity or pneumothorax. Electronically Signed   By: Richardean Sale M.D.   On: 09/09/2018 16:31   Dg Femur Min 2 Views Left  Result Date: 09/09/2018 CLINICAL DATA:  Recent falls.  Left thigh pain. EXAM: LEFT FEMUR 2 VIEWS COMPARISON:  Left hip radiographs 07/31/2018. FINDINGS: The mineralization and alignment are normal. There is no evidence of acute fracture or dislocation. Stable mild degenerative changes at the left hip and knee. There are scattered vascular calcifications and vascular clips. No unexpected foreign bodies. IMPRESSION: No evidence of acute fracture or dislocation. Electronically Signed   By: Richardean Sale M.D.   On: 09/09/2018 18:22     ASSESSMENT AND PLAN  73 y.o. female past medical history significant for prior CVA, hypertension, diabetes, hyperlipidemia, coronary artery disease, congestive heart failure with a EF of 25% A. fib Eliquis, CKD. PVD presenting with multiple complaints.  Most concerning for stroke is her slurred speech and fall.  CT head shows right frontal lobe infarcts, however these are likely subacute and have been there for several weeks and that do not think there responsible for patient's presentation or headache.  I do think she will need MRI brain to assess if she has had any further infarcts that we do not see on CT.   Migraine Subacute to chronic right frontal lobe infarcts, new from prior scan in February 7628 Sepsis Systolic Heart Failure   Recommendations MRI brain without contrast to rule out acute stroke MRA head Continue Eliquis Plavix and Crestor 40 mg daily Migraine cocktail if MRI negative for acute stroke Continue to treat CHF, evaluate and management for leukocytosis/sepsis Blood pressure goal should be normotension   Addendum I  reviewed her MRI brain, as suspected there  was no acute infarct.  She does have multiple new infarcts that are new from prior MRIs.  MRA shows reveal severe intracranial atherosclerosis. Unfortunately patient appears to be already optimized with Eliquis, Plavix as well as high-dose statin.  She does have elevated BMI and therefore I do think diet and exercise as well as risk factor modification such as blood pressure control will be very important to prevent future strokes. Recommend migraine cocktail to treat headache.    Claira Jeter [facility] Pager Number [phone]

## 2018-09-10 NOTE — Evaluation (Signed)
Physical Therapy Evaluation Patient Details Name: [name] [name] [name]: [id number] DOB: [birth year] Today's Date: 09/10/2018   History of Present Illness  Patient is a 73 y/o female who presents with slurred speech, falls, fever, dizziness, N/V and SOB. Found to have Acute systolic CHF, lobar pneumonia, acute respiratory failure and sepsis.Head CT-new area hypoattenuation in right frontal lobe. MRA-multiple areas of stenosis including severe b/l ICA stenosis. CXR-pulmonary edema.  PMH includes OA, CVA, MI, HTN, HLD, CKD, PVD, DM2, CHF, CAD, anxiety.  Clinical Impression  Patient presents with generalized weakness, pain, decreased activity tolerance, impaired balance and impaired mobility s/p above. Pt d/ced from SNF end of July and has been home with support of husband. Uses RW for mobility and reports multiple falls at home. Has been getting [facility] services and would like to continue. Gets assist with ADLs at baseline. Today, pt requires Min-Mod A for transfers and taking a few steps to get to chair with use of RW. Sp02 remained in high 90s on 7L/min 02 HF [place]. Session limited to chair per nurse due to respiratory issues earlier in AM and pt requiring high level of 02. Will follow acutely to increase mobility and maximize independence and mobility prior to return home.     Follow Up Recommendations Home health PT;Supervision for mobility/OOB;Supervision/Assistance - 24 hour    Equipment Recommendations  None recommended by PT    Recommendations for Other Services       Precautions / Restrictions Precautions Precautions: Fall Precaution Comments: hx of falls at home (~8 in last 6 months) Restrictions Weight Bearing Restrictions: No      Mobility  Bed Mobility Overal bed mobility: Needs Assistance Bed Mobility: Supine to Sit     Supine to sit: Min assist;HOB elevated     General bed mobility comments: Increased time and effort, heavy use of rail and assist to scoot bottom towards  EOB.  Transfers Overall transfer level: Needs assistance Equipment used: Rolling walker (2 wheeled) Transfers: Sit to/from [facility] Sit to Stand: Min assist;From elevated surface Stand pivot transfers: Mod assist       General transfer comment: ASsist to power to standing with cues for hand placement. Able to take a few steps to get to chair- LOB posteriorly requiring mod A to correct. Sp02 remained high 90s on 7L/min HF [place].  Ambulation/Gait                Stairs            Wheelchair Mobility    Modified Rankin (Stroke Patients Only) Modified Rankin (Stroke Patients Only) Pre-Morbid Rankin Score: Moderately severe disability Modified Rankin: Moderately severe disability     Balance Overall balance assessment: Needs assistance;History of Falls Sitting-balance support: Feet supported;No upper extremity supported Sitting balance-Leahy Scale: Fair     Standing balance support: During functional activity Standing balance-Leahy Scale: Poor Standing balance comment: Reliant on BUEs and external support for standing balance.                             Pertinent Vitals/Pain Pain Assessment: Faces Faces Pain Scale: Hurts little more Pain Location: left side where she fell Pain Descriptors / Indicators: Sore;Aching Pain Intervention(s): Repositioned;Monitored during session    Home Living Family/patient expects to be discharged to:: Private residence Living Arrangements: Spouse/significant other Available Help at Discharge: Family;Available 24 hours/day Type of Home: House Home Access: Level entry     Home Layout: Two level;Bed/bath  upstairs Home Equipment: Walker - 4 wheels      Prior Function Level of [place]: Needs assistance   Gait / Transfers Assistance Needed: Uses rollator for ambuation  ADL's / Homemaking Assistance Needed: Assist from husband for ADLs  Comments: Has been doing [facility] services. reports her  balance is no great and she falls a lot.     Hand Dominance   Dominant Hand: Right    Extremity/Trunk Assessment   Upper Extremity Assessment Upper Extremity Assessment: Defer to OT evaluation    Lower Extremity Assessment Lower Extremity Assessment: Generalized weakness(Reports LLE weaker than RLE from prior surgeries)    Cervical / Trunk Assessment Cervical / Trunk Assessment: (Large bruising on left posterior flank from fall)  Communication   Communication: No difficulties  Cognition Arousal/Alertness: Awake/alert Behavior During Therapy: WFL for tasks assessed/performed Overall Cognitive Status: Within Functional Limits for tasks assessed                                        General Comments General comments (skin integrity, edema, etc.): Spouse stepped out during session. Noted to have bruising on posterior left flank and left thigh from recent fall.    Exercises     Assessment/Plan    PT Assessment Patient needs continued PT services  PT Problem List Decreased strength;Decreased mobility;Pain;Decreased balance;Decreased skin integrity;Cardiopulmonary status limiting activity;Decreased activity tolerance;Obesity       PT Treatment Interventions Therapeutic activities;Gait training;Therapeutic exercise;Patient/family education;Wheelchair mobility training;Balance training;Functional mobility training;Neuromuscular re-education    PT Goals (Current goals can be found in the Care Plan section)  Acute Rehab PT Goals Patient Stated Goal: to go  home with my husband PT Goal Formulation: With patient Time For Goal Achievement: 09/24/18 Potential to Achieve Goals: Fair    Frequency Min 3X/week   Barriers to discharge        Co-evaluation               AM-PAC PT "6 Clicks" Mobility  Outcome Measure Help needed turning from your back to your side while in a flat bed without using bedrails?: A Little Help needed moving from lying on your  back to sitting on the side of a flat bed without using bedrails?: A Little Help needed moving to and from a bed to a chair (including a wheelchair)?: A Lot Help needed standing up from a chair using your arms (e.g., wheelchair or bedside chair)?: A Lot Help needed to walk in hospital room?: A Lot Help needed climbing 3-5 steps with a railing? : Total 6 Click Score: 13    End of Session Equipment Utilized During Treatment: Gait belt;Oxygen(7L/min 02 HF [place]) Activity Tolerance: Patient tolerated treatment well Patient left: in chair;with call bell/phone within reach;with family/visitor present Nurse Communication: Mobility status PT Visit Diagnosis: Pain;Muscle weakness (generalized) (M62.81);Unsteadiness on feet (R26.81) Pain - Right/Left: Left Pain - part of body: Leg(back)    Time: 8563-1497 PT Time Calculation (min) (ACUTE ONLY): 28 min   Charges:   PT Evaluation $PT Eval Moderate Complexity: 1 Mod PT Treatments $Therapeutic Activity: 8-22 mins        Wray Kearns, PT, DPT Acute Rehabilitation Services Pager [phone] Office [place] 09/10/2018, 4:00 PM

## 2018-09-10 NOTE — Progress Notes (Signed)
MRI is negative for acute stroke, but it does appear there has been interval cerebrovascular disease.  I suspect that her acute presentation is more recrudescence of previous symptoms in the setting of her shortness of breath, nausea, etc, rather than actual acute neurological presentation.  She is already on antiplatelet therapy and anticoagulation, her LDL is 48 and she does not have any significant carotid disease.  I do not think that there is much to be done differently as far stroke prevention.  I discussed this with the patient and her husband.  At this time, I would not recommend any further neurological testing, the medical team continues to care for her nonneurological issues.  Neurology will be available as needed, please call if further questions or concerns.  Roland Rack, MD [facility] [phone]  If 7pm- 7am, please page neurology on call as listed in [place].

## 2018-09-10 NOTE — Progress Notes (Signed)
PROGRESS NOTE    [name] [name]   GYB:[id number]  DOB: 1946-01-26  DOA: 09/09/2018 PCP: Derinda Late, MD   Brief Narrative:  [name] [name] is a 73 y.o. female with medical history significant of hypertension, hyperlipidemia, diabetes mellitus, asthma, stroke, GERD, depression with anxiety, PVD, OSA not on CPAP, CAD, stent, CABG, sCHF with EF 25%, memory change, atrial fibrillation on Eliquis, left bundle blockage, CKD stage III, who presents with slurred speech, dizziness, fall, fever. She has fallen 3 x at home.   In ED > WBC 14.5, Temp 100.9, tachypnea CXR > vascular congestion/ pulm edema CT head > New areas of hypoattenuation in the subcortical white matter of the right frontal lobe may represent small age-indeterminate ischemic infarcts.  MRI> Multiple rounded areas of peripheral hyperintense T2-weighted signal within the bilateral supratentorial white matter, corresponding to the areas of hypoattenuation on the earlier head CT. These likely indicate late subacute to early chronic infarcts, not present on prior MRIs. No diffusion restriction to indicate acute ischemia.  MRA> multiple areas of stenosis including severe b/l ICA stenosis- see full report below  Subjective: After being transported up to the floor from the ED, called by RN as patient was in respiratory distress and hypoxic. O2 was increased by the RN to 4 L. On my exam the patient was tachypneic, very sleepy but able to be awakened. She complained of some dyspnea and cough with green sputum. Also admitted to nausea on further questioning, told me that she had been vomiting yesterday.     Assessment & Plan:   Principal Problem:   Slurred speech- ? Due to multifocal infarcts - MRI/ MRA reports above- will order carotid duplex-    Active Problems: Acute respiratory failure- Sepsis Acute systolic CHF and lobar pneumonia - CXR in ED suggestive of pulmonary edema - noted to have fevers, leukocytosis and  complained of vomiting yesterday Plan> Add antibiotic coverage for possible aspiration pneumonia, give Lasix to diurese, repeat CXR, obtain ABG, MRSA PCR,  change to progressive status Addendum: CXR showing pulmonary edema and developing bronchopneumonia in RLL - ABG showing hypoxia- no CO2 retention which is what I was also worried about with her sleepiness    Paroxysmal Atrial fibrillation  - cont Eliquis and Toprol     Falls - ? Due to hypoxia vs recent CVAs - PT eval  Nausea - Vomiting - feels that this has resolved  Type II diabetes mellitus with renal manifestations  - cont Insulin and follow sugars    CKD (chronic kidney disease), stage III  - follow while diuresing  Asthma - cont nebs PRN- no wheezing at the moment- no exacerbation     Depression with anxiety - Cymbalta + Wellbutrin    Hyperlipemia - cont Statin + Zetia    Essential hypertension - cont Norvasc, Toprol & diuretics    GERD - cont Protonix    CAD (coronary artery disease) of bypass graft - on Plavix, statin, Eliquis  Time spent in minutes: 45 min- addressed patient's respiratory distress and went back to room 3 x to re-assess- discussed plan with neurology as well DVT prophylaxis: Eliquis Code Status: Full code Family Communication:  Disposition Plan: follow on Progressive care Consultants:   Neuro Procedures:     Antimicrobials:  Anti-infectives (From admission, onward)   Start     Dose/Rate Route Frequency Ordered Stop   09/10/18 2100  vancomycin (VANCOCIN) IVPB 1000 mg/200 mL premix     1,000 mg 200 mL/hr over 60  Minutes Intravenous Every 24 hours 09/09/18 2037     09/09/18 2100  ceFEPIme (MAXIPIME) 2 g in sodium chloride 0.9 % 100 mL IVPB     2 g 200 mL/hr over 30 Minutes Intravenous Every 12 hours 09/09/18 2030     09/09/18 2045  vancomycin (VANCOCIN) 2,000 mg in sodium chloride 0.9 % 500 mL IVPB     2,000 mg 250 mL/hr over 120 Minutes Intravenous  Once 09/09/18 2030 09/09/18  2309   09/09/18 2000  levofloxacin (LEVAQUIN) IVPB 500 mg  Status:  Discontinued     500 mg 100 mL/hr over 60 Minutes Intravenous  Once 09/09/18 1911 09/09/18 2010       Objective: Vitals:   09/10/18 0745 09/10/18 0814 09/10/18 1015 09/10/18 1127  BP: (!) 188/102 (!) 178/109 (!) 180/108   Pulse: (!) 114 (!) 113 (!) 112   Resp: (!) 29 (!) 26 (!) 27   Temp:   98.7 F (37.1 C)   TempSrc:   Axillary   SpO2: 91% 93% 92% 97%  Weight:      Height:        Intake/Output Summary (Last 24 hours) at 09/10/2018 1152 Last data filed at 09/10/2018 0239 Gross per 24 hour  Intake --  Output 2400 ml  Net -2400 ml   Filed Weights   09/09/18 2015  Weight: 116.2 kg    Examination: General exam: Appears comfortable  HEENT: PERRLA, oral mucosa moist, no sclera icterus or thrush Respiratory system: tachypnea, crackles at b/l bases- pulse ox 93% on 5 L Cardiovascular system: S1 & S2 heard, RRR. Tachycardic   Gastrointestinal system: Abdomen soft, non-tender, nondistended. Normal bowel sounds. Central nervous system: Alert and oriented. No focal neurological deficits. Extremities: No cyanosis, clubbing or edema Skin: No rashes or ulcers Psychiatry:  Mood & affect appropriate.     Data Reviewed: I have personally reviewed following labs and imaging studies  CBC: Recent Labs  Lab 09/09/18 1440 09/10/18 0936  WBC 14.5* 22.1*  NEUTROABS 11.7*  --   HGB 11.4* 11.7*  HCT 36.8 37.6  MCV 92.2 91.9  PLT 237 355   Basic Metabolic Panel: Recent Labs  Lab 09/09/18 1440 09/10/18 0925  NA 138 139  K 3.3* 3.2*  CL 99 94*  CO2 25 29  GLUCOSE 210* 306*  BUN 11 12  CREATININE 1.13* 1.42*  CALCIUM 9.3 9.0  MG  --  2.4   GFR: Estimated Creatinine Clearance: 45 mL/min (A) (by C-G formula based on SCr of 1.42 mg/dL (H)). Liver Function Tests: Recent Labs  Lab 09/09/18 1440 09/10/18 0925  AST 18 17  ALT 15 15  ALKPHOS 94 86  BILITOT 1.8* 2.3*  PROT 6.7 6.7  ALBUMIN 3.4* 3.2*   No  results for input(s): LIPASE, AMYLASE in the last 168 hours. No results for input(s): AMMONIA in the last 168 hours. Coagulation Profile: No results for input(s): INR, PROTIME in the last 168 hours. Cardiac Enzymes: No results for input(s): CKTOTAL, CKMB, CKMBINDEX, TROPONINI in the last 168 hours. BNP (last 3 results) No results for input(s): PROBNP in the last 8760 hours. HbA1C: Recent Labs    09/10/18 0936  HGBA1C 7.0*   CBG: Recent Labs  Lab 09/09/18 1548 09/09/18 2355 09/10/18 0244  GLUCAP 194* 184* 228*   Lipid Profile: Recent Labs    09/10/18 0925  CHOL 103  HDL 37*  LDLCALC 48  TRIG 90  CHOLHDL 2.8   Thyroid Function Tests: No results for input(s): TSH,  T4TOTAL, FREET4, T3FREE, THYROIDAB in the last 72 hours. Anemia Panel: No results for input(s): VITAMINB12, FOLATE, FERRITIN, TIBC, IRON, RETICCTPCT in the last 72 hours. Urine analysis:    Component Value Date/Time   COLORURINE YELLOW 09/09/2018 [place] 09/09/2018 1542   LABSPEC 1.008 09/09/2018 1542   PHURINE 5.0 09/09/2018 1542   GLUCOSEU NEGATIVE 09/09/2018 1542   HGBUR SMALL (A) 09/09/2018 1542   BILIRUBINUR NEGATIVE 09/09/2018 1542   KETONESUR NEGATIVE 09/09/2018 1542   PROTEINUR 100 (A) 09/09/2018 1542   UROBILINOGEN 1.0 04/16/2011 0954   NITRITE NEGATIVE 09/09/2018 1542   LEUKOCYTESUR NEGATIVE 09/09/2018 1542   Sepsis Labs: @LABRCNTIP (procalcitonin:4,lacticidven:4) ) Recent Results (from the past 240 hour(s))  SARS Coronavirus 2 [facility] order, Performed in [facility] hospital lab) Nasopharyngeal Urine, Clean Catch     Status: None   Collection Time: 09/09/18  3:40 PM   Specimen: Urine, Clean Catch; Nasopharyngeal  Result Value Ref Range Status   SARS Coronavirus 2 NEGATIVE NEGATIVE Final    Comment: (NOTE) If result is NEGATIVE SARS-CoV-2 target nucleic acids are NOT DETECTED. The SARS-CoV-2 RNA is generally detectable in upper and lower  respiratory specimens during  the acute phase of infection. The lowest  concentration of SARS-CoV-2 viral copies this assay can detect is 250  copies / mL. A negative result does not preclude SARS-CoV-2 infection  and should not be used as the sole basis for treatment or other  patient management decisions.  A negative result may occur with  improper specimen collection / handling, submission of specimen other  than nasopharyngeal swab, presence of viral mutation(s) within the  areas targeted by this assay, and inadequate number of viral copies  (<250 copies / mL). A negative result must be combined with clinical  observations, patient history, and epidemiological information. If result is POSITIVE SARS-CoV-2 target nucleic acids are DETECTED. The SARS-CoV-2 RNA is generally detectable in upper and lower  respiratory specimens dur ing the acute phase of infection.  Positive  results are indicative of active infection with SARS-CoV-2.  Clinical  correlation with patient history and other diagnostic information is  necessary to determine patient infection status.  Positive results do  not rule out bacterial infection or co-infection with other viruses. If result is PRESUMPTIVE POSTIVE SARS-CoV-2 nucleic acids MAY BE PRESENT.   A presumptive positive result was obtained on the submitted specimen  and confirmed on repeat testing.  While 2019 novel coronavirus  (SARS-CoV-2) nucleic acids may be present in the submitted sample  additional confirmatory testing may be necessary for epidemiological  and / or clinical management purposes  to differentiate between  SARS-CoV-2 and other Sarbecovirus currently known to infect humans.  If clinically indicated additional testing with an alternate test  methodology [phone]) is advised. The SARS-CoV-2 RNA is generally  detectable in upper and lower respiratory sp ecimens during the acute  phase of infection. The expected result is Negative. Fact Sheet for Patients:   StrictlyIdeas.no Fact Sheet for Healthcare Providers: BankingDealers.co.za This test is not yet approved or cleared by the [place] FDA and has been authorized for detection and/or diagnosis of SARS-CoV-2 by FDA under an Emergency Use Authorization (EUA).  This EUA will remain in effect (meaning this test can be used) for the duration of the COVID-19 declaration under Section 564(b)(1) of the Act, 21 U.S.C. section 360bbb-3(b)(1), unless the authorization is terminated or revoked sooner. Performed at [facility], [place] [address], [place], [place] [address]   Urine  Culture     Status: None   Collection Time: 09/09/18  3:40 PM   Specimen: Urine, Clean Catch  Result Value Ref Range Status   Specimen Description URINE, CLEAN CATCH  Final   Special Requests NONE  Final   Culture   Final    NO GROWTH Performed at [facility], [place] [address] [place], [place] [address]    Report Status 09/10/2018 FINAL  Final  Blood culture (routine x 2)     Status: None (Preliminary result)   Collection Time: 09/09/18  6:50 PM   Specimen: BLOOD  Result Value Ref Range Status   Specimen Description BLOOD LEFT FINGER  Final   Special Requests   Final    BOTTLES DRAWN AEROBIC AND ANAEROBIC Blood Culture adequate volume   Culture   Final    NO GROWTH < 24 HOURS Performed at [facility], [place] [address] [name], [place] [address]    Report Status PENDING  Incomplete  Blood culture (routine x 2)     Status: None (Preliminary result)   Collection Time: 09/09/18  6:52 PM   Specimen: BLOOD  Result Value Ref Range Status   Specimen Description BLOOD BLOOD LEFT WRIST  Final   Special Requests   Final    BOTTLES DRAWN AEROBIC AND ANAEROBIC Blood Culture adequate volume   Culture   Final    NO GROWTH < 24 HOURS Performed at [facility], [place] [address], [place], [place] [address]    Report Status PENDING  Incomplete          Radiology Studies: Dg Ribs Unilateral Left  Result Date: 09/09/2018 CLINICAL DATA:  Recent falls.  Left leg and rib pain. EXAM: LEFT RIBS - 2 VIEW COMPARISON:  Chest radiographs today.  Chest CT 04/03/2018. FINDINGS: Two views demonstrate no evidence of acute left-sided rib fracture or focal rib lesion. As noted on earlier radiographs, there is probable pulmonary edema with stable cardiomegaly. No significant pleural effusion or pneumothorax. There are postsurgical changes in the left upper quadrant of the abdomen. IMPRESSION: No evidence of left-sided rib fracture, pleural effusion or pneumothorax. Electronically Signed   By: Richardean Sale M.D.   On: 09/09/2018 18:20   Ct Head Wo Contrast  Result Date: 09/09/2018 CLINICAL DATA:  Head trauma. High clinical risk. Slurred speech. EXAM: CT HEAD WITHOUT CONTRAST TECHNIQUE: Contiguous axial images were obtained from the base of the skull through the vertex without intravenous contrast. COMPARISON:  March 29, 2018 FINDINGS: Brain: No evidence of acute hemorrhage, hydrocephalus, extra-axial collection or mass lesion/mass effect. Chronic right occipital lobe infarct. New areas of hypoattenuation in the subcortical white matter of the right frontal lobe may represent small age-indeterminate ischemic infarcts. Vascular: Calcific atherosclerotic disease of the intra cavernous carotid arteries. Skull: Normal. Negative for fracture or focal lesion. Sinuses/Orbits: Minimal opacification of the left mastoid air cells. Paranasal sinuses are clear. Other: None. IMPRESSION: 1. New areas of hypoattenuation in the subcortical white matter of the right frontal lobe may represent small age-indeterminate ischemic infarcts. 2. Chronic right occipital lobe infarct. 3. No evidence of traumatic injury to the brain. Electronically Signed   By: Fidela Salisbury M.D.   On: 09/09/2018 17:29   Mr Angio Head Wo Contrast  Result Date: 09/10/2018 CLINICAL DATA:  Weakness  and dizziness for the past several days. Mild slurred speech developed today. EXAM: MRI HEAD WITHOUT CONTRAST MRA HEAD WITHOUT CONTRAST TECHNIQUE: Multiplanar, multiecho pulse sequences of the brain and  surrounding structures were obtained without intravenous contrast. Angiographic images of the head were obtained using MRA technique without contrast. COMPARISON:  Brain MRI 10/07/2013 and MRA head 06/01/2014 FINDINGS: MRI HEAD FINDINGS BRAIN: There is no acute infarct, acute hemorrhage or extra-axial collection. There is an old right PCA territory infarct. Multiple areas of rounded hyperintense T2-weighted signal are present within the white matter of both hemispheres. The cerebral and cerebellar volume are age-appropriate. There is no hydrocephalus. The midline structures are normal. VASCULAR: There are 3-5 scattered foci of chronic microhemorrhage. The major intracranial arterial and venous sinus flow voids are normal. SKULL AND UPPER CERVICAL SPINE: Calvarial bone marrow signal is normal. There is no skull base mass. The visualized upper cervical spine and soft tissues are normal. SINUSES/ORBITS: There are no fluid levels or advanced mucosal thickening. The mastoid air cells and middle ear cavities are free of fluid. The orbits are normal. MRA HEAD FINDINGS POSTERIOR CIRCULATION: --Vertebral arteries: Normal V4 segments. --Posterior inferior cerebellar arteries (PICA): Patent origins from the vertebral arteries. --Anterior inferior cerebellar arteries (AICA): Not clearly visualized, though this is not uncommon. --Basilar artery: Proximal basilar artery is severely narrowed, but remains patent. --Superior cerebellar arteries: Normal. --Posterior cerebral arteries (PCA): The right PCA arises from the basilar artery with a diminutive right posterior communicating artery (P-comm). There is no right PCA occlusion or proximal stenosis. The left PCA originates combination from the basilar artery and AE small left  P-comm. There is severe stenosis of the P1 and P2 segments. ANTERIOR CIRCULATION: --Intracranial internal carotid arteries: There is multifocal severe stenosis of both internal carotid arteries at the skull base. --Anterior cerebral arteries (ACA): Limited enhancement of the left A1 segment. Otherwise normal. --Middle cerebral arteries (MCA): There is mild narrowing within some of the distal right MCA M2 branches, but the right MCA is otherwise normal. There is loss of flow related enhancement at the left M1 segment proximally. The distal M1 segment enhances normally. The left M2 branches are patent. IMPRESSION: 1. No acute ischemic infarct or intracranial hemorrhage. 2. Multiple rounded areas of peripheral hyperintense T2-weighted signal within the bilateral supratentorial white matter, corresponding to the areas of hypoattenuation on the earlier head CT. These likely indicate late subacute to early chronic infarcts, not present on prior MRIs. No diffusion restriction to indicate acute ischemia. 3. Severe intracranial atherosclerosis with short segment occlusion of the proximal left middle cerebral artery with normal enhancement of the distal left M1 segment and the M2 branches, new compared to 06/01/2014. 4. Severe stenosis of the left PCA P1 and P2 segments, unchanged compared to 06/01/2014. 5. Severe stenoses of both internal carotid arteries at the skull base, progressed compared to 06/01/2014. Electronically Signed   By: Ulyses Jarred M.D.   On: 09/10/2018 02:19   Mr Brain Wo Contrast  Result Date: 09/10/2018 CLINICAL DATA:  Weakness and dizziness for the past several days. Mild slurred speech developed today. EXAM: MRI HEAD WITHOUT CONTRAST MRA HEAD WITHOUT CONTRAST TECHNIQUE: Multiplanar, multiecho pulse sequences of the brain and surrounding structures were obtained without intravenous contrast. Angiographic images of the head were obtained using MRA technique without contrast. COMPARISON:  Brain MRI  10/07/2013 and MRA head 06/01/2014 FINDINGS: MRI HEAD FINDINGS BRAIN: There is no acute infarct, acute hemorrhage or extra-axial collection. There is an old right PCA territory infarct. Multiple areas of rounded hyperintense T2-weighted signal are present within the white matter of both hemispheres. The cerebral and cerebellar volume are age-appropriate. There is no hydrocephalus. The midline structures  are normal. VASCULAR: There are 3-5 scattered foci of chronic microhemorrhage. The major intracranial arterial and venous sinus flow voids are normal. SKULL AND UPPER CERVICAL SPINE: Calvarial bone marrow signal is normal. There is no skull base mass. The visualized upper cervical spine and soft tissues are normal. SINUSES/ORBITS: There are no fluid levels or advanced mucosal thickening. The mastoid air cells and middle ear cavities are free of fluid. The orbits are normal. MRA HEAD FINDINGS POSTERIOR CIRCULATION: --Vertebral arteries: Normal V4 segments. --Posterior inferior cerebellar arteries (PICA): Patent origins from the vertebral arteries. --Anterior inferior cerebellar arteries (AICA): Not clearly visualized, though this is not uncommon. --Basilar artery: Proximal basilar artery is severely narrowed, but remains patent. --Superior cerebellar arteries: Normal. --Posterior cerebral arteries (PCA): The right PCA arises from the basilar artery with a diminutive right posterior communicating artery (P-comm). There is no right PCA occlusion or proximal stenosis. The left PCA originates combination from the basilar artery and AE small left P-comm. There is severe stenosis of the P1 and P2 segments. ANTERIOR CIRCULATION: --Intracranial internal carotid arteries: There is multifocal severe stenosis of both internal carotid arteries at the skull base. --Anterior cerebral arteries (ACA): Limited enhancement of the left A1 segment. Otherwise normal. --Middle cerebral arteries (MCA): There is mild narrowing within some of  the distal right MCA M2 branches, but the right MCA is otherwise normal. There is loss of flow related enhancement at the left M1 segment proximally. The distal M1 segment enhances normally. The left M2 branches are patent. IMPRESSION: 1. No acute ischemic infarct or intracranial hemorrhage. 2. Multiple rounded areas of peripheral hyperintense T2-weighted signal within the bilateral supratentorial white matter, corresponding to the areas of hypoattenuation on the earlier head CT. These likely indicate late subacute to early chronic infarcts, not present on prior MRIs. No diffusion restriction to indicate acute ischemia. 3. Severe intracranial atherosclerosis with short segment occlusion of the proximal left middle cerebral artery with normal enhancement of the distal left M1 segment and the M2 branches, new compared to 06/01/2014. 4. Severe stenosis of the left PCA P1 and P2 segments, unchanged compared to 06/01/2014. 5. Severe stenoses of both internal carotid arteries at the skull base, progressed compared to 06/01/2014. Electronically Signed   By: Ulyses Jarred M.D.   On: 09/10/2018 02:19   Dg Chest Port 1 View  Result Date: 09/10/2018 CLINICAL DATA:  73 year old female with history of dyspnea. EXAM: PORTABLE CHEST 1 VIEW COMPARISON:  Chest x-ray 09/09/2018. FINDINGS: There is cephalization of the pulmonary vasculature and slight indistinctness of the interstitial markings suggestive of mild pulmonary edema. More focal consolidative airspace disease in the right lung base. No definite pleural effusions. Mild cardiomegaly. Upper mediastinal contours are within normal limits. Aortic atherosclerosis. Status post median sternotomy for CABG. IMPRESSION: 1. The appearance the chest suggests congestive heart failure, as above. In addition, there is more focal airspace consolidation in the right lung base which could suggest superimposed developing bronchopneumonia. Further clinical evaluation is suggested. 2. Aortic  atherosclerosis. Electronically Signed   By: Vinnie Langton M.D.   On: 09/10/2018 10:31   Dg Chest Port 1 View  Result Date: 09/09/2018 CLINICAL DATA:  Shortness of breath multiple recent falls and slurred speech. History of diabetes and stroke. EXAM: PORTABLE CHEST 1 VIEW COMPARISON:  Radiographs 05/18/2018 and 04/05/2018.  CT 04/03/2018. FINDINGS: 1549 hours. Stable cardiomegaly post median sternotomy and CABG. Pulmonary vascular congestion has slightly worsened, and there is probable mild pulmonary edema. No confluent airspace opacity, pleural effusion or  pneumothorax. The bones appear intact. Telemetry leads overlie the chest. IMPRESSION: Cardiomegaly with increased vascular congestion and probable mild pulmonary edema. No confluent airspace opacity or pneumothorax. Electronically Signed   By: Richardean Sale M.D.   On: 09/09/2018 16:31   Dg Femur Min 2 Views Left  Result Date: 09/09/2018 CLINICAL DATA:  Recent falls.  Left thigh pain. EXAM: LEFT FEMUR 2 VIEWS COMPARISON:  Left hip radiographs 07/31/2018. FINDINGS: The mineralization and alignment are normal. There is no evidence of acute fracture or dislocation. Stable mild degenerative changes at the left hip and knee. There are scattered vascular calcifications and vascular clips. No unexpected foreign bodies. IMPRESSION: No evidence of acute fracture or dislocation. Electronically Signed   By: Richardean Sale M.D.   On: 09/09/2018 18:22      Scheduled Meds:  ALPRAZolam  0.5 mg Oral BID   apixaban  5 mg Oral BID   budesonide  0.25 mg Nebulization BID   buPROPion  150 mg Oral Daily   clopidogrel  75 mg Oral QHS   DULoxetine  30 mg Oral Daily   ezetimibe  10 mg Oral QHS   gabapentin  600 mg Oral BID   insulin aspart  0-9 Units Subcutaneous TID WC   insulin glargine  26 Units Subcutaneous QHS   ipratropium-albuterol       LORazepam       metolazone  2.5 mg Oral Daily   mometasone-formoterol  2 puff Inhalation BID    multivitamin  1 tablet Oral Daily   pantoprazole  40 mg Oral Daily   ranolazine  1,000 mg Oral Q12H   rosuvastatin  40 mg Oral QHS   sodium chloride flush  3 mL Intravenous Once   triamcinolone cream  1 application Topical Daily   vitamin B-12  1,000 mcg Oral Daily   zolpidem  5 mg Oral QHS   Continuous Infusions:  ceFEPime (MAXIPIME) IV 2 g (09/10/18 1106)   vancomycin       LOS: 1 day      Debbe Odea, MD [facility] Pager: [email or web address] Password [email or web address] 09/10/2018, 11:52 AM

## 2018-09-10 NOTE — Progress Notes (Signed)
SLP Cancellation Note  Patient Details Name: [name] [name] [name]: [id number] DOB: 1945/12/29   Cancelled treatment:       Reason Eval/Treat Not Completed: Medical issues which prohibited therapy. Per RN, patient lethargic and becomes SOB with any activity/conversation due to CHF. Will hold evaluation until 8/10, RN in agreement.   [name] MA, CCC-SLP     [name] 09/10/2018, 11:10 AM

## 2018-09-10 NOTE — ED Notes (Signed)
Pt continues to vomit  Dr neu notified   He has ordered reglan

## 2018-09-10 NOTE — ED Notes (Signed)
The pt has to have a bm up to the bedside toilet her vomiting had stopped since she had the reglan

## 2018-09-10 NOTE — Progress Notes (Signed)
Carotid duplex has been completed.   Preliminary results in CV Proc.   Abram Sander 09/10/2018 1:42 PM

## 2018-09-10 NOTE — ED Notes (Signed)
The pt returned from mri 

## 2018-09-10 NOTE — ED Notes (Signed)
The p[t continues to vomit

## 2018-09-11 LAB — GLUCOSE, CAPILLARY
Glucose-Capillary: 175 mg/dL — ABNORMAL HIGH (ref 70–99)
Glucose-Capillary: 289 mg/dL — ABNORMAL HIGH (ref 70–99)
Glucose-Capillary: 343 mg/dL — ABNORMAL HIGH (ref 70–99)
Glucose-Capillary: 286 mg/dL — ABNORMAL HIGH (ref 70–99)

## 2018-09-11 LAB — BASIC METABOLIC PANEL
Anion gap: 12 (ref 5–15)
BUN: 22 mg/dL (ref 8–23)
CO2: 38 mmol/L — ABNORMAL HIGH (ref 22–32)
Calcium: 8.6 mg/dL — ABNORMAL LOW (ref 8.9–10.3)
Chloride: 90 mmol/L — ABNORMAL LOW (ref 98–111)
Creatinine, Ser: 1.41 mg/dL — ABNORMAL HIGH (ref 0.44–1.00)
GFR calc Af Amer: 44 mL/min — ABNORMAL LOW (ref >60)
GFR calc non Af Amer: 38 mL/min — ABNORMAL LOW (ref >60)
Glucose, Bld: 196 mg/dL — ABNORMAL HIGH (ref 70–99)
Potassium: 2.8 mmol/L — ABNORMAL LOW (ref 3.5–5.1)
Sodium: 140 mmol/L (ref 135–145)

## 2018-09-11 LAB — MAGNESIUM: Magnesium: 2.1 mg/dL (ref 1.7–2.4)

## 2018-09-11 LAB — CBC
HCT: 35.3 % — ABNORMAL LOW (ref 36.0–46.0)
Hemoglobin: 10.8 g/dL — ABNORMAL LOW (ref 12.0–15.0)
MCH: 28.5 pg (ref 26.0–34.0)
MCHC: 30.6 g/dL (ref 30.0–36.0)
MCV: 93.1 fL (ref 80.0–100.0)
Platelets: 170 10*3/uL (ref 150–400)
RBC: 3.79 MIL/uL — ABNORMAL LOW (ref 3.87–5.11)
RDW: 15.1 % (ref 11.5–15.5)
WBC: 12.9 10*3/uL — ABNORMAL HIGH (ref 4.0–10.5)
nRBC: 0 % (ref 0.0–0.2)

## 2018-09-11 MED ORDER — POTASSIUM CHLORIDE CRYS ER 20 MEQ PO TBCR
40.0000 meq | EXTENDED_RELEASE_TABLET | ORAL | Status: AC
  Administered 2018-09-11 (×2): 40 meq via ORAL
  Filled 2018-09-11 (×2): qty 2

## 2018-09-11 MED ORDER — INSULIN ASPART 100 UNIT/ML ~~LOC~~ SOLN
4.0000 [IU] | Freq: Three times a day (TID) | SUBCUTANEOUS | Status: DC
  Administered 2018-09-11 – 2018-09-13 (×6): 4 [IU] via SUBCUTANEOUS

## 2018-09-11 MED ORDER — SODIUM CHLORIDE 0.9 % IV SOLN
INTRAVENOUS | Status: DC | PRN
  Administered 2018-09-11: 250 mL via INTRAVENOUS

## 2018-09-11 MED ORDER — FUROSEMIDE 10 MG/ML IJ SOLN
40.0000 mg | Freq: Two times a day (BID) | INTRAMUSCULAR | Status: DC
  Administered 2018-09-11 – 2018-09-12 (×2): 40 mg via INTRAVENOUS
  Filled 2018-09-11 (×2): qty 4

## 2018-09-11 MED ORDER — AMLODIPINE BESYLATE 5 MG PO TABS: 5.0000 mg | ORAL_TABLET | Freq: Every day | ORAL | 3 refills | Status: DC

## 2018-09-11 MED ORDER — POTASSIUM CHLORIDE CRYS ER 20 MEQ PO TBCR
40.0000 meq | EXTENDED_RELEASE_TABLET | Freq: Once | ORAL | Status: AC
  Administered 2018-09-11: 40 meq via ORAL
  Filled 2018-09-11: qty 2

## 2018-09-11 NOTE — Addendum Note (Signed)
Addended by: Harvie Junior on: 09/11/2018 11:02 AM   Modules accepted: Orders

## 2018-09-11 NOTE — Progress Notes (Signed)
Spoke with and updated husband this am

## 2018-09-11 NOTE — Progress Notes (Signed)
PROGRESS NOTE    [name] [name]   KDT:[id number]  DOB: 1945/02/21  DOA: 09/09/2018 PCP: Derinda Late, MD   Brief Narrative:  [name] [name] is a 73 y.o. female with medical history significant of hypertension, hyperlipidemia, diabetes mellitus, asthma, stroke, GERD, depression with anxiety, PVD, OSA not on CPAP, CAD, stent, CABG, sCHF with EF 25%, memory change, atrial fibrillation on Eliquis, left bundle blockage, CKD stage III, who presents with slurred speech, dizziness, fall, fever. She has fallen 3 x at home.   In ED > WBC 14.5, Temp 100.9, tachypnea CXR > vascular congestion/ pulm edema CT head > New areas of hypoattenuation in the subcortical white matter of the right frontal lobe may represent small age-indeterminate ischemic infarcts.  MRI> Multiple rounded areas of peripheral hyperintense T2-weighted signal within the bilateral supratentorial white matter, corresponding to the areas of hypoattenuation on the earlier head CT. These likely indicate late subacute to early chronic infarcts, not present on prior MRIs. No diffusion restriction to indicate acute ischemia.  MRA> multiple areas of stenosis including severe b/l ICA stenosis- see full report below  Subjective: Patient complains of cough today.  There is no dyspnea at rest.  She does not feel nauseated and last episode of vomiting was yesterday morning.  She has no other complaints    Assessment & Plan:   Principal Problem:   Slurred speech-  -Neuro feels that she has recrudescence of previous symptoms due to her pneumonia - MRI/ MRA reports above-neuro does not feel that any changes needs to be made to her medication regimen-continue Eliquis  Active Problems: Acute respiratory failure-severe sepsis Acute systolic CHF and lobar pneumonia - CXR in ED suggestive of pulmonary edema - noted to have fevers, leukocytosis and complained of vomiting for about 2 days prior to admission -P chest x-ray 8/9 CXR showing  pulmonary edema and developing bronchopneumonia in RLL -Continue antibiotics-fevers and leukocytosis improving -Continue to diurese  Hypokalemia -Potassium is 2.8, likely secondary to Lasix- replace and follow     Paroxysmal Atrial fibrillation  - cont Eliquis and Toprol     Falls - ? Due to hypoxia vs recent CVAs - PT eval  Nausea - Vomiting - feels that this has resolved  Type II diabetes mellitus with renal manifestations  - cont Insulin and follow sugars    CKD (chronic kidney disease), stage III  - follow while diuresing  Asthma - cont nebs PRN- no wheezing at the moment- no exacerbation     Depression with anxiety - Cymbalta + Wellbutrin    Hyperlipemia - cont Statin + Zetia    Essential hypertension - cont Norvasc, Toprol & diuretics    GERD - cont Protonix    CAD (coronary artery disease) of bypass graft - on Plavix, statin, Eliquis  Time spent in minutes: 35 DVT prophylaxis: Eliquis Code Status: Full code Family Communication:  Disposition Plan: PT/OT recommend home health Consultants:   Neuro Procedures:     Antimicrobials:  Anti-infectives (From admission, onward)   Start     Dose/Rate Route Frequency Ordered Stop   09/10/18 2100  vancomycin (VANCOCIN) IVPB 1000 mg/200 mL premix     1,000 mg 200 mL/hr over 60 Minutes Intravenous Every 24 hours 09/09/18 2037     09/09/18 2100  ceFEPIme (MAXIPIME) 2 g in sodium chloride 0.9 % 100 mL IVPB     2 g 200 mL/hr over 30 Minutes Intravenous Every 12 hours 09/09/18 2030     09/09/18 2045  vancomycin (  VANCOCIN) 2,000 mg in sodium chloride 0.9 % 500 mL IVPB     2,000 mg 250 mL/hr over 120 Minutes Intravenous  Once 09/09/18 2030 09/09/18 2309   09/09/18 2000  levofloxacin (LEVAQUIN) IVPB 500 mg  Status:  Discontinued     500 mg 100 mL/hr over 60 Minutes Intravenous  Once 09/09/18 1911 09/09/18 2010       Objective: Vitals:   09/11/18 0600 09/11/18 0802 09/11/18 0828 09/11/18 1107  BP: (!) 154/77   (!) 144/74 (!) 147/69  Pulse:    75  Resp:    20  Temp:   98.1 F (36.7 C) 97.7 F (36.5 C)  TempSrc:   Oral Oral  SpO2:  100%  98%  Weight:      Height:        Intake/Output Summary (Last 24 hours) at 09/11/2018 1352 Last data filed at 09/11/2018 0981 Gross per 24 hour  Intake 640 ml  Output 1100 ml  Net -460 ml   Filed Weights   09/09/18 2015  Weight: 116.2 kg    Examination: General exam: Appears comfortable  HEENT: PERRLA, oral mucosa moist, no sclera icterus or thrush Respiratory system: Coarse crackles in right lower lobe-she continues to be hypoxic and is requiring 5 L to keep pulse ox in the mid 90s-respiratory effort normal. Cardiovascular system: S1 & S2 heard,  No murmurs  Gastrointestinal system: Abdomen soft, non-tender, nondistended. Normal bowel sounds   Central nervous system: Alert and oriented. No focal neurological deficits. Extremities: No cyanosis, clubbing or edema Skin: No rashes or ulcers Psychiatry:  Mood & affect appropriate.     Data Reviewed: I have personally reviewed following labs and imaging studies  CBC: Recent Labs  Lab 09/09/18 1440 09/10/18 0936 09/11/18 0647  WBC 14.5* 22.1* 12.9*  NEUTROABS 11.7*  --   --   HGB 11.4* 11.7* 10.8*  HCT 36.8 37.6 35.3*  MCV 92.2 91.9 93.1  PLT 237 233 191   Basic Metabolic Panel: Recent Labs  Lab 09/09/18 1440 09/10/18 0925 09/11/18 0647  NA 138 139 140  K 3.3* 3.2* 2.8*  CL 99 94* 90*  CO2 25 29 38*  GLUCOSE 210* 306* 196*  BUN 11 12 22   CREATININE 1.13* 1.42* 1.41*  CALCIUM 9.3 9.0 8.6*  MG  --  2.4  --    GFR: Estimated Creatinine Clearance: 45.3 mL/min (A) (by C-G formula based on SCr of 1.41 mg/dL (H)). Liver Function Tests: Recent Labs  Lab 09/09/18 1440 09/10/18 0925  AST 18 17  ALT 15 15  ALKPHOS 94 86  BILITOT 1.8* 2.3*  PROT 6.7 6.7  ALBUMIN 3.4* 3.2*   No results for input(s): LIPASE, AMYLASE in the last 168 hours. No results for input(s): AMMONIA in the  last 168 hours. Coagulation Profile: No results for input(s): INR, PROTIME in the last 168 hours. Cardiac Enzymes: No results for input(s): CKTOTAL, CKMB, CKMBINDEX, TROPONINI in the last 168 hours. BNP (last 3 results) No results for input(s): PROBNP in the last 8760 hours. HbA1C: Recent Labs    09/10/18 0936  HGBA1C 7.0*   CBG: Recent Labs  Lab 09/10/18 1320 09/10/18 1739 09/10/18 2117 09/11/18 0618 09/11/18 1103  GLUCAP 293* 279* 249* 175* 289*   Lipid Profile: Recent Labs    09/10/18 0925  CHOL 103  HDL 37*  LDLCALC 48  TRIG 90  CHOLHDL 2.8   Thyroid Function Tests: No results for input(s): TSH, T4TOTAL, FREET4, T3FREE, THYROIDAB in the last 72  hours. Anemia Panel: No results for input(s): VITAMINB12, FOLATE, FERRITIN, TIBC, IRON, RETICCTPCT in the last 72 hours. Urine analysis:    Component Value Date/Time   COLORURINE YELLOW 09/09/2018 [place] 09/09/2018 1542   LABSPEC 1.008 09/09/2018 1542   PHURINE 5.0 09/09/2018 1542   GLUCOSEU NEGATIVE 09/09/2018 1542   HGBUR SMALL (A) 09/09/2018 1542   BILIRUBINUR NEGATIVE 09/09/2018 1542   KETONESUR NEGATIVE 09/09/2018 1542   PROTEINUR 100 (A) 09/09/2018 1542   UROBILINOGEN 1.0 04/16/2011 0954   NITRITE NEGATIVE 09/09/2018 1542   LEUKOCYTESUR NEGATIVE 09/09/2018 1542   Sepsis Labs: @LABRCNTIP (procalcitonin:4,lacticidven:4) ) Recent Results (from the past 240 hour(s))  SARS Coronavirus 2 [facility] order, Performed in [facility] hospital lab) Nasopharyngeal Urine, Clean Catch     Status: None   Collection Time: 09/09/18  3:40 PM   Specimen: Urine, Clean Catch; Nasopharyngeal  Result Value Ref Range Status   SARS Coronavirus 2 NEGATIVE NEGATIVE Final    Comment: (NOTE) If result is NEGATIVE SARS-CoV-2 target nucleic acids are NOT DETECTED. The SARS-CoV-2 RNA is generally detectable in upper and lower  respiratory specimens during the acute phase of infection. The lowest  concentration of  SARS-CoV-2 viral copies this assay can detect is 250  copies / mL. A negative result does not preclude SARS-CoV-2 infection  and should not be used as the sole basis for treatment or other  patient management decisions.  A negative result may occur with  improper specimen collection / handling, submission of specimen other  than nasopharyngeal swab, presence of viral mutation(s) within the  areas targeted by this assay, and inadequate number of viral copies  (<250 copies / mL). A negative result must be combined with clinical  observations, patient history, and epidemiological information. If result is POSITIVE SARS-CoV-2 target nucleic acids are DETECTED. The SARS-CoV-2 RNA is generally detectable in upper and lower  respiratory specimens dur ing the acute phase of infection.  Positive  results are indicative of active infection with SARS-CoV-2.  Clinical  correlation with patient history and other diagnostic information is  necessary to determine patient infection status.  Positive results do  not rule out bacterial infection or co-infection with other viruses. If result is PRESUMPTIVE POSTIVE SARS-CoV-2 nucleic acids MAY BE PRESENT.   A presumptive positive result was obtained on the submitted specimen  and confirmed on repeat testing.  While 2019 novel coronavirus  (SARS-CoV-2) nucleic acids may be present in the submitted sample  additional confirmatory testing may be necessary for epidemiological  and / or clinical management purposes  to differentiate between  SARS-CoV-2 and other Sarbecovirus currently known to infect humans.  If clinically indicated additional testing with an alternate test  methodology [phone]) is advised. The SARS-CoV-2 RNA is generally  detectable in upper and lower respiratory sp ecimens during the acute  phase of infection. The expected result is Negative. Fact Sheet for Patients:  StrictlyIdeas.no Fact Sheet for Healthcare  Providers: BankingDealers.co.za This test is not yet approved or cleared by the [place] FDA and has been authorized for detection and/or diagnosis of SARS-CoV-2 by FDA under an Emergency Use Authorization (EUA).  This EUA will remain in effect (meaning this test can be used) for the duration of the COVID-19 declaration under Section 564(b)(1) of the Act, 21 U.S.C. section 360bbb-3(b)(1), unless the authorization is terminated or revoked sooner. Performed at [facility], [place] [address] [place], [place] [address]   Urine Culture     Status: None  Collection Time: 09/09/18  3:40 PM   Specimen: Urine, Clean Catch  Result Value Ref Range Status   Specimen Description URINE, CLEAN CATCH  Final   Special Requests NONE  Final   Culture   Final    NO GROWTH Performed at [facility], [place] [address] [place], [place] [address]    Report Status 09/10/2018 FINAL  Final  Blood culture (routine x 2)     Status: None (Preliminary result)   Collection Time: 09/09/18  6:50 PM   Specimen: BLOOD  Result Value Ref Range Status   Specimen Description BLOOD LEFT FINGER  Final   Special Requests   Final    BOTTLES DRAWN AEROBIC AND ANAEROBIC Blood Culture adequate volume   Culture   Final    NO GROWTH 2 DAYS Performed at [facility], [place] [address], [place], [place] [address]    Report Status PENDING  Incomplete  Blood culture (routine x 2)     Status: None (Preliminary result)   Collection Time: 09/09/18  6:52 PM   Specimen: BLOOD  Result Value Ref Range Status   Specimen Description BLOOD BLOOD LEFT WRIST  Final   Special Requests   Final    BOTTLES DRAWN AEROBIC AND ANAEROBIC Blood Culture adequate volume   Culture   Final    NO GROWTH 2 DAYS Performed at [facility], [place] [address] [place], [place] [address]    Report Status PENDING  Incomplete  Respiratory Panel by PCR     Status: None   Collection Time: 09/10/18 10:31 AM    Specimen: Nasopharyngeal Swab; Respiratory  Result Value Ref Range Status   Adenovirus NOT DETECTED NOT DETECTED Final   Coronavirus 229E NOT DETECTED NOT DETECTED Final    Comment: (NOTE) The Coronavirus on the Respiratory Panel, DOES NOT test for the novel  Coronavirus (2019 nCoV)    Coronavirus HKU1 NOT DETECTED NOT DETECTED Final   Coronavirus NL63 NOT DETECTED NOT DETECTED Final   Coronavirus OC43 NOT DETECTED NOT DETECTED Final   Metapneumovirus NOT DETECTED NOT DETECTED Final   Rhinovirus / Enterovirus NOT DETECTED NOT DETECTED Final   Influenza A NOT DETECTED NOT DETECTED Final   Influenza B NOT DETECTED NOT DETECTED Final   Parainfluenza Virus 1 NOT DETECTED NOT DETECTED Final   Parainfluenza Virus 2 NOT DETECTED NOT DETECTED Final   Parainfluenza Virus 3 NOT DETECTED NOT DETECTED Final   Parainfluenza Virus 4 NOT DETECTED NOT DETECTED Final   Respiratory Syncytial Virus NOT DETECTED NOT DETECTED Final   Bordetella pertussis NOT DETECTED NOT DETECTED Final   Chlamydophila pneumoniae NOT DETECTED NOT DETECTED Final   Mycoplasma pneumoniae NOT DETECTED NOT DETECTED Final    Comment: Performed at [facility], [place]. [address] [place], [place] [address]  MRSA PCR Screening     Status: Abnormal   Collection Time: 09/10/18  2:10 PM   Specimen: Nasal Mucosa; Nasopharyngeal  Result Value Ref Range Status   MRSA by PCR POSITIVE (A) NEGATIVE Final    Comment:        The GeneXpert MRSA Assay (FDA approved for NASAL specimens only), is one component of a comprehensive MRSA colonization surveillance program. It is not intended to diagnose MRSA infection nor to guide or monitor treatment for MRSA infections. RESULT CALLED TO, READ BACK BY AND VERIFIED WITH: RN Evelene Croon [id number] Performed at [place] [address], [place], [place] [address]  Radiology Studies: Dg Ribs Unilateral Left  Result Date: 09/09/2018 CLINICAL DATA:  Recent  falls.  Left leg and rib pain. EXAM: LEFT RIBS - 2 VIEW COMPARISON:  Chest radiographs today.  Chest CT 04/03/2018. FINDINGS: Two views demonstrate no evidence of acute left-sided rib fracture or focal rib lesion. As noted on earlier radiographs, there is probable pulmonary edema with stable cardiomegaly. No significant pleural effusion or pneumothorax. There are postsurgical changes in the left upper quadrant of the abdomen. IMPRESSION: No evidence of left-sided rib fracture, pleural effusion or pneumothorax. Electronically Signed   By: Richardean Sale M.D.   On: 09/09/2018 18:20   Ct Head Wo Contrast  Result Date: 09/09/2018 CLINICAL DATA:  Head trauma. High clinical risk. Slurred speech. EXAM: CT HEAD WITHOUT CONTRAST TECHNIQUE: Contiguous axial images were obtained from the base of the skull through the vertex without intravenous contrast. COMPARISON:  March 29, 2018 FINDINGS: Brain: No evidence of acute hemorrhage, hydrocephalus, extra-axial collection or mass lesion/mass effect. Chronic right occipital lobe infarct. New areas of hypoattenuation in the subcortical white matter of the right frontal lobe may represent small age-indeterminate ischemic infarcts. Vascular: Calcific atherosclerotic disease of the intra cavernous carotid arteries. Skull: Normal. Negative for fracture or focal lesion. Sinuses/Orbits: Minimal opacification of the left mastoid air cells. Paranasal sinuses are clear. Other: None. IMPRESSION: 1. New areas of hypoattenuation in the subcortical white matter of the right frontal lobe may represent small age-indeterminate ischemic infarcts. 2. Chronic right occipital lobe infarct. 3. No evidence of traumatic injury to the brain. Electronically Signed   By: Fidela Salisbury M.D.   On: 09/09/2018 17:29   Mr Angio Head Wo Contrast  Result Date: 09/10/2018 CLINICAL DATA:  Weakness and dizziness for the past several days. Mild slurred speech developed today. EXAM: MRI HEAD WITHOUT  CONTRAST MRA HEAD WITHOUT CONTRAST TECHNIQUE: Multiplanar, multiecho pulse sequences of the brain and surrounding structures were obtained without intravenous contrast. Angiographic images of the head were obtained using MRA technique without contrast. COMPARISON:  Brain MRI 10/07/2013 and MRA head 06/01/2014 FINDINGS: MRI HEAD FINDINGS BRAIN: There is no acute infarct, acute hemorrhage or extra-axial collection. There is an old right PCA territory infarct. Multiple areas of rounded hyperintense T2-weighted signal are present within the white matter of both hemispheres. The cerebral and cerebellar volume are age-appropriate. There is no hydrocephalus. The midline structures are normal. VASCULAR: There are 3-5 scattered foci of chronic microhemorrhage. The major intracranial arterial and venous sinus flow voids are normal. SKULL AND UPPER CERVICAL SPINE: Calvarial bone marrow signal is normal. There is no skull base mass. The visualized upper cervical spine and soft tissues are normal. SINUSES/ORBITS: There are no fluid levels or advanced mucosal thickening. The mastoid air cells and middle ear cavities are free of fluid. The orbits are normal. MRA HEAD FINDINGS POSTERIOR CIRCULATION: --Vertebral arteries: Normal V4 segments. --Posterior inferior cerebellar arteries (PICA): Patent origins from the vertebral arteries. --Anterior inferior cerebellar arteries (AICA): Not clearly visualized, though this is not uncommon. --Basilar artery: Proximal basilar artery is severely narrowed, but remains patent. --Superior cerebellar arteries: Normal. --Posterior cerebral arteries (PCA): The right PCA arises from the basilar artery with a diminutive right posterior communicating artery (P-comm). There is no right PCA occlusion or proximal stenosis. The left PCA originates combination from the basilar artery and AE small left P-comm. There is severe stenosis of the P1 and P2 segments. ANTERIOR CIRCULATION: --Intracranial internal  carotid arteries: There is multifocal severe stenosis of both internal carotid arteries  at the skull base. --Anterior cerebral arteries (ACA): Limited enhancement of the left A1 segment. Otherwise normal. --Middle cerebral arteries (MCA): There is mild narrowing within some of the distal right MCA M2 branches, but the right MCA is otherwise normal. There is loss of flow related enhancement at the left M1 segment proximally. The distal M1 segment enhances normally. The left M2 branches are patent. IMPRESSION: 1. No acute ischemic infarct or intracranial hemorrhage. 2. Multiple rounded areas of peripheral hyperintense T2-weighted signal within the bilateral supratentorial white matter, corresponding to the areas of hypoattenuation on the earlier head CT. These likely indicate late subacute to early chronic infarcts, not present on prior MRIs. No diffusion restriction to indicate acute ischemia. 3. Severe intracranial atherosclerosis with short segment occlusion of the proximal left middle cerebral artery with normal enhancement of the distal left M1 segment and the M2 branches, new compared to 06/01/2014. 4. Severe stenosis of the left PCA P1 and P2 segments, unchanged compared to 06/01/2014. 5. Severe stenoses of both internal carotid arteries at the skull base, progressed compared to 06/01/2014. Electronically Signed   By: Ulyses Jarred M.D.   On: 09/10/2018 02:19   Mr Brain Wo Contrast  Result Date: 09/10/2018 CLINICAL DATA:  Weakness and dizziness for the past several days. Mild slurred speech developed today. EXAM: MRI HEAD WITHOUT CONTRAST MRA HEAD WITHOUT CONTRAST TECHNIQUE: Multiplanar, multiecho pulse sequences of the brain and surrounding structures were obtained without intravenous contrast. Angiographic images of the head were obtained using MRA technique without contrast. COMPARISON:  Brain MRI 10/07/2013 and MRA head 06/01/2014 FINDINGS: MRI HEAD FINDINGS BRAIN: There is no acute infarct, acute  hemorrhage or extra-axial collection. There is an old right PCA territory infarct. Multiple areas of rounded hyperintense T2-weighted signal are present within the white matter of both hemispheres. The cerebral and cerebellar volume are age-appropriate. There is no hydrocephalus. The midline structures are normal. VASCULAR: There are 3-5 scattered foci of chronic microhemorrhage. The major intracranial arterial and venous sinus flow voids are normal. SKULL AND UPPER CERVICAL SPINE: Calvarial bone marrow signal is normal. There is no skull base mass. The visualized upper cervical spine and soft tissues are normal. SINUSES/ORBITS: There are no fluid levels or advanced mucosal thickening. The mastoid air cells and middle ear cavities are free of fluid. The orbits are normal. MRA HEAD FINDINGS POSTERIOR CIRCULATION: --Vertebral arteries: Normal V4 segments. --Posterior inferior cerebellar arteries (PICA): Patent origins from the vertebral arteries. --Anterior inferior cerebellar arteries (AICA): Not clearly visualized, though this is not uncommon. --Basilar artery: Proximal basilar artery is severely narrowed, but remains patent. --Superior cerebellar arteries: Normal. --Posterior cerebral arteries (PCA): The right PCA arises from the basilar artery with a diminutive right posterior communicating artery (P-comm). There is no right PCA occlusion or proximal stenosis. The left PCA originates combination from the basilar artery and AE small left P-comm. There is severe stenosis of the P1 and P2 segments. ANTERIOR CIRCULATION: --Intracranial internal carotid arteries: There is multifocal severe stenosis of both internal carotid arteries at the skull base. --Anterior cerebral arteries (ACA): Limited enhancement of the left A1 segment. Otherwise normal. --Middle cerebral arteries (MCA): There is mild narrowing within some of the distal right MCA M2 branches, but the right MCA is otherwise normal. There is loss of flow related  enhancement at the left M1 segment proximally. The distal M1 segment enhances normally. The left M2 branches are patent. IMPRESSION: 1. No acute ischemic infarct or intracranial hemorrhage. 2. Multiple rounded areas of peripheral  hyperintense T2-weighted signal within the bilateral supratentorial white matter, corresponding to the areas of hypoattenuation on the earlier head CT. These likely indicate late subacute to early chronic infarcts, not present on prior MRIs. No diffusion restriction to indicate acute ischemia. 3. Severe intracranial atherosclerosis with short segment occlusion of the proximal left middle cerebral artery with normal enhancement of the distal left M1 segment and the M2 branches, new compared to 06/01/2014. 4. Severe stenosis of the left PCA P1 and P2 segments, unchanged compared to 06/01/2014. 5. Severe stenoses of both internal carotid arteries at the skull base, progressed compared to 06/01/2014. Electronically Signed   By: Ulyses Jarred M.D.   On: 09/10/2018 02:19   Dg Chest Port 1 View  Result Date: 09/10/2018 CLINICAL DATA:  73 year old female with history of dyspnea. EXAM: PORTABLE CHEST 1 VIEW COMPARISON:  Chest x-ray 09/09/2018. FINDINGS: There is cephalization of the pulmonary vasculature and slight indistinctness of the interstitial markings suggestive of mild pulmonary edema. More focal consolidative airspace disease in the right lung base. No definite pleural effusions. Mild cardiomegaly. Upper mediastinal contours are within normal limits. Aortic atherosclerosis. Status post median sternotomy for CABG. IMPRESSION: 1. The appearance the chest suggests congestive heart failure, as above. In addition, there is more focal airspace consolidation in the right lung base which could suggest superimposed developing bronchopneumonia. Further clinical evaluation is suggested. 2. Aortic atherosclerosis. Electronically Signed   By: Vinnie Langton M.D.   On: 09/10/2018 10:31   Dg Chest  Port 1 View  Result Date: 09/09/2018 CLINICAL DATA:  Shortness of breath multiple recent falls and slurred speech. History of diabetes and stroke. EXAM: PORTABLE CHEST 1 VIEW COMPARISON:  Radiographs 05/18/2018 and 04/05/2018.  CT 04/03/2018. FINDINGS: 1549 hours. Stable cardiomegaly post median sternotomy and CABG. Pulmonary vascular congestion has slightly worsened, and there is probable mild pulmonary edema. No confluent airspace opacity, pleural effusion or pneumothorax. The bones appear intact. Telemetry leads overlie the chest. IMPRESSION: Cardiomegaly with increased vascular congestion and probable mild pulmonary edema. No confluent airspace opacity or pneumothorax. Electronically Signed   By: Richardean Sale M.D.   On: 09/09/2018 16:31   Dg Femur Min 2 Views Left  Result Date: 09/09/2018 CLINICAL DATA:  Recent falls.  Left thigh pain. EXAM: LEFT FEMUR 2 VIEWS COMPARISON:  Left hip radiographs 07/31/2018. FINDINGS: The mineralization and alignment are normal. There is no evidence of acute fracture or dislocation. Stable mild degenerative changes at the left hip and knee. There are scattered vascular calcifications and vascular clips. No unexpected foreign bodies. IMPRESSION: No evidence of acute fracture or dislocation. Electronically Signed   By: Richardean Sale M.D.   On: 09/09/2018 18:22   Vas US Carotid  Result Date: 09/11/2018 Carotid Arterial Duplex Study Indications:       CVA. Risk Factors:      Hypertension, hyperlipidemia, Diabetes, coronary artery                    disease, prior CVA. Comparison Study:  05/23/17 Performing Technologist: Abram Sander RVS  Examination Guidelines: A complete evaluation includes B-mode imaging, spectral Doppler, color Doppler, and power Doppler as needed of all accessible portions of each vessel. Bilateral testing is considered an integral part of a complete examination. Limited examinations for reoccurring indications may be performed as noted.  Right Carotid  Findings: +----------+--------+--------+--------+------------+---------+             PSV cm/s EDV cm/s Stenosis Describe     Comments   +----------+--------+--------+--------+------------+---------+  CCA Prox   34       8                 heterogenous            +----------+--------+--------+--------+------------+---------+  CCA Distal 51       11                heterogenous            +----------+--------+--------+--------+------------+---------+  ICA Prox   66       25       1-39%    heterogenous Shadowing  +----------+--------+--------+--------+------------+---------+  ICA Distal 59       30                                        +----------+--------+--------+--------+------------+---------+  ECA        466               >50%                             +----------+--------+--------+--------+------------+---------+ +----------+--------+-------+--------+-------------------+             PSV cm/s EDV cms Describe Arm Pressure (mmHG)  +----------+--------+-------+--------+-------------------+  Subclavian 86                                             +----------+--------+-------+--------+-------------------+ +---------+--------+--+--------+-+---------+  Vertebral PSV cm/s 30 EDV cm/s 9 Antegrade  +---------+--------+--+--------+-+---------+  Left Carotid Findings: +----------+--------+--------+--------+------------+--------+             PSV cm/s EDV cm/s Stenosis Describe     Comments  +----------+--------+--------+--------+------------+--------+  CCA Prox   70       19                heterogenous           +----------+--------+--------+--------+------------+--------+  CCA Distal 52       16                heterogenous           +----------+--------+--------+--------+------------+--------+  ECA        392               >50%                            +----------+--------+--------+--------+------------+--------+ +----------+--------+--------+--------+-------------------+  Subclavian PSV cm/s EDV cm/s Describe Arm  Pressure (mmHG)  +----------+--------+--------+--------+-------------------+             85                                              +----------+--------+--------+--------+-------------------+ +---------+--------+--+--------+--+---------+  Vertebral PSV cm/s 93 EDV cm/s 25 Antegrade  +---------+--------+--+--------+--+---------+  Left Stent(s): +---------------+--+--+-------------+++  Prox to Stent   64 16                  +---------------+--+--+-------------+++  Proximal Stent  72 15 <50% stenosis    +---------------+--+--+-------------+++  Mid Stent       75 35                  +---------------+--+--+-------------+++  Distal Stent    78 37                  +---------------+--+--+-------------+++  Distal to Stent 97 46                  +---------------+--+--+-------------+++  Summary: Right Carotid: Velocities in the right ICA are consistent with a 1-39% stenosis.                The ECA appears >50% stenosed. Left Carotid: ICA stent <50% stenosis. The ECA appears >50% stenosed. Vertebrals: Bilateral vertebral arteries demonstrate antegrade flow. *See table(s) above for measurements and observations.  Electronically signed by Harold Barban MD on 09/11/2018 at 10:07:51 AM.    Final       Scheduled Meds:  ALPRAZolam  0.5 mg Oral BID   amLODipine  10 mg Oral Daily   apixaban  5 mg Oral BID   budesonide  0.25 mg Nebulization BID   buPROPion  150 mg Oral Daily   Chlorhexidine Gluconate Cloth  6 each Topical Q0600   clopidogrel  75 mg Oral QHS   DULoxetine  30 mg Oral Daily   ezetimibe  10 mg Oral QHS   gabapentin  600 mg Oral BID   insulin aspart  0-9 Units Subcutaneous TID WC   insulin glargine  26 Units Subcutaneous QHS   metolazone  2.5 mg Oral Daily   metoprolol succinate  50 mg Oral Daily   mometasone-formoterol  2 puff Inhalation BID   multivitamin  1 tablet Oral Daily   mupirocin ointment  1 application Nasal BID   pantoprazole  40 mg Oral Daily   ranolazine  1,000 mg  Oral Q12H   rosuvastatin  40 mg Oral QHS   sodium chloride flush  3 mL Intravenous Once   triamcinolone cream  1 application Topical Daily   vitamin B-12  1,000 mcg Oral Daily   zolpidem  5 mg Oral QHS   Continuous Infusions:  ceFEPime (MAXIPIME) IV 2 g (09/11/18 1051)   vancomycin 1,000 mg (09/10/18 2217)     LOS: 2 days      Debbe Odea, MD [facility] Pager: [email or web address] Password [email or web address] 09/11/2018, 1:52 PM

## 2018-09-11 NOTE — Progress Notes (Signed)
RN Davy Pique states patient no longer needs PIV by IV team, Charge nurse was able to start new site

## 2018-09-11 NOTE — Telephone Encounter (Signed)
HHRN aware and agreeable with plan. Called pt no answer left VM.

## 2018-09-11 NOTE — Progress Notes (Signed)
Inpatient Diabetes Program Recommendations  AACE/ADA: New Consensus Statement on Inpatient Glycemic Control (2015)  Target Ranges:  Prepandial:   less than 140 mg/dL      Peak postprandial:   less than 180 mg/dL (1-2 hours)      Critically ill patients:  140 - 180 mg/dL   Lab Results  Component Value Date   GLUCAP 289 (H) 09/11/2018   HGBA1C 7.0 (H) 09/10/2018    Review of Glycemic Control Results for [name] [name], [name] [name] ([name] [id number]) as of 09/11/2018 12:41  Ref. Range 09/11/2018 11:03  Glucose-Capillary Latest Ref Range: 70 - 99 mg/dL 289 (H)   Diabetes history: DM2 Outpatient Diabetes medications: Lantus 38 units + Novolog 20 units tid meal coverage Current orders for Inpatient glycemic control: Lantus 26 units + Novolog sensitive correction tid  Inpatient Diabetes Program Recommendations:   -Novolog 4 units tid meal coverage if eats 50%  Thank you, Bethena Roys E. Shanikka Wonders, RN, MSN, CDE  Diabetes Coordinator Inpatient Glycemic Control Team Team Pager [phone] (8am-5pm) 09/11/2018 12:43 PM

## 2018-09-11 NOTE — Evaluation (Signed)
Speech Language Pathology Evaluation Patient Details Name: LYCIA SACHDEVA [name]: [id number] DOB: [birth year] Today's Date: 09/11/2018 Time: 5465-0354 SLP Time Calculation (min) (ACUTE ONLY): 30 min  Problem List:  Patient Active Problem List   Diagnosis Date Noted  . Fall 09/09/2018  . Stroke ([place]) 09/09/2018  . Type II diabetes mellitus with renal manifestations ([place]) 09/09/2018  . CKD (chronic kidney disease), stage III ([place]) 09/09/2018  . Sepsis ([place]) 09/09/2018  . Depression with anxiety 09/09/2018  . Slurred speech 09/09/2018  . Ankle fracture, left 05/16/2018  . Amiodarone pulmonary toxicity   . Physical deconditioning   . 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 Acute on chronic congestive heart failure ([place])   . Chronic systolic CHF (congestive heart failure) ([place]) 01/16/2018  . Acute on chronic diastolic CHF (congestive heart failure) ([place]) 01/16/2018  . Acute on chronic diastolic (congestive) heart failure ([place]) 01/16/2018  . Atrial fibrillation ([place]) 10/21/2017  . CAD (coronary artery disease) of bypass graft 10/20/2017  . Coronary artery disease of bypass graft of native heart with stable angina pectoris ([place])   . Chronic low back pain 08/24/2016  . Pain of left thumb 08/05/2016  . Nocturnal hypoxia 06/02/2016  . Hoarseness 04/05/2016  . Laryngopharyngeal reflux (LPR) 04/05/2016  . Pharyngoesophageal dysphagia 04/05/2016  . Angina decubitus ([place]) 05/22/2015  . Chronic insomnia 05/06/2015  . Common migraine 05/14/2014  . Abnormality of gait 05/14/2014  . Memory change 05/14/2014  . Aneurysm, cerebral, nonruptured 05/14/2014  . Cramp of limb-Left neck 05/10/2014  . Hematemesis with nausea   . Vomiting blood   . Dizziness and giddiness 09/21/2013  . Atypical chest pain 07/18/2013  . Unstable angina ([place]) 04/09/2013  . Carotid artery stenosis, symptomatic 03/20/2013  . Chronic diastolic CHF (congestive heart failure) ([place]) 09/23/2012  . Cerebrovascular disease 07/10/2012  .  Cerebral artery occlusion with cerebral infarction ([place]) 07/10/2012  . Mitral regurgitation 04/15/2012  . TIA (transient ischemic attack) 04/15/2012  . Occlusion and stenosis of carotid artery without mention of cerebral infarction 08/18/2011  . Bariatric surgery status 06/17/2011  . Speech abnormality 03/22/2011  . Dyspnea 02/24/2011  . PAPILLARY MUSCLE DYSFUNCTION, NON-RHEUMATIC 10/09/2008  . UNSPECIFIED VITAMIN D DEFICIENCY 10/24/2007  . MYOCARDIAL INFARCTION, HX OF 10/24/2007  . PERSISTENT VOMITING 10/24/2007  . OSTEOARTHRITIS 10/24/2007  . MIGRAINES, HX OF 10/24/2007  . DM 01/16/2007  . Hyperlipemia 01/16/2007  . Obesity-post failed open gastroplasty 1984  01/16/2007  . OBSTRUCTIVE SLEEP APNEA 01/16/2007  . Essential hypertension 01/16/2007  . Coronary atherosclerosis 01/16/2007  . Asthma 01/16/2007  . GERD 01/16/2007  . VENTRAL HERNIA 01/16/2007   Past Medical History:  Past Medical History:  Diagnosis Date  . Abnormality of gait 05/14/2014  . Anemia    hx  . Anginal pain ([place])   . Anxiety   . Asthma   . Basal cell carcinoma 05/2014   "left shoulder"  . Bundle branch block, left    chronic/notes 07/18/2013  . CHF (congestive heart failure) ([place])   . Chronic bronchitis ([place])    "off and on all the time" (07/18/2013)  . Chronic insomnia 05/06/2015  . Chronic kidney disease    frequency, sees dr Jamal Maes every 4 to 6 months (01/16/2018)  . Chronic low back pain 08/24/2016  . Chronic lower back pain   . Claustrophobia   . Common migraine 05/14/2014  . Coronary artery disease   . Depression   . Dysrhythmia   . GERD (gastroesophageal reflux disease)   . H/O hiatal  hernia   . Headache    "at least 2/month" (01/16/2018)  . Heart murmur   . Hyperlipidemia   . Hypertension   . Irregular heart beat   . Leg cramps    both legs at times  . Melanoma ([place]) 05/2014   "burned off BLE" (01/16/2018)  . Memory change 05/14/2014  . Migraine    "5-6/year"  (01/16/2018)   . Myocardial infarction ([place]) 04/1999, 02/2000, 01/2005; 2011; 2014  . Neuromuscular disorder ([place])    ?  . Obesity 01-2010  . Obstructive sleep apnea    "can't wear machine; I have claustrophobia" (01/16/2018), states she had a 2nd sleep study and does not have sleep apnea, her O2 decreases and now is on 2 L of O2 at night.  . On home oxygen therapy    "2L at night and prn during daytime" (01/16/2018)  . Osteoarthritis    "knees and hands" (01/16/2018)  . Other and unspecified angina pectoris   . Peripheral vascular disease (H[facility])    ? numbness, tingling arms and legs  . Pneumonia 2000's   "once"  . PONV (postoperative nausea and vomiting)   . Shortness of breath    with exertion  . Stroke ([place]) 03/22/12   right side brain; denies residual on 07/18/2013)  . Stroke [facility]) Oct. 2015   Affected pt.s balance  . Stroke [facility]) 10/2014   "affected my legs; fully recovered now"; still have sporatic memory issues from this one" (01/16/2018)  . Type II diabetes mellitus ([place])   . Ventral hernia    hx of  . Vomiting    persistent  . Vomiting blood    Past Surgical History:  Past Surgical History:  Procedure Laterality Date  . ANKLE FRACTURE SURGERY Left 1970's  . APPENDECTOMY  1970's   w/hysterectomy  . BASAL CELL CARCINOMA EXCISION Left 05/2014   "shoulder" (01/16/2018)  . CARDIAC CATHETERIZATION  10/10/2012   Dr [name].  Marland Kitchen CARDIAC CATHETERIZATION N/A 05/29/2015   Procedure: Right/Left Heart Cath and Coronary/Graft Angiography;  Surgeon: Larey Dresser, MD;  Location: [facility] CV [facility];  Service: Cardiovascular;  Laterality: N/A;  . CAROTID ENDARTERECTOMY Left 03/2013  . CAROTID STENT INSERTION Left 03/20/2013   Procedure: CAROTID STENT INSERTION;  Surgeon: [name], MD;  Location: [facility] CATH LAB;  Service: Cardiovascular;  Laterality: Left;  internal carotid  . CEREBRAL ANGIOGRAM N/A 04/05/2011   Procedure: CEREBRAL ANGIOGRAM;  Surgeon: Angelia Mould, MD;  Location: [facility] CATH  LAB;  Service: Cardiovascular;  Laterality: N/A;  . CHOLECYSTECTOMY OPEN  2004  . CORONARY ANGIOPLASTY WITH STENT PLACEMENT  01,02,05,06,07,08,11; 04/24/2013   "I've probably got ~ 10 stents by now" (04/24/2013)  . CORONARY ANGIOPLASTY WITH STENT PLACEMENT  06/13/2013   "got 4 stents today" (06/13/2013)  . CORONARY ARTERY BYPASS GRAFT  1220/11   "CABG X5"  . CORONARY STENT INTERVENTION N/A 10/20/2017   Procedure: CORONARY STENT INTERVENTION;  Surgeon: Troy Sine, MD;  Location: [facility] LAB;  Service: Cardiovascular;  Laterality: N/A;  . ESOPHAGOGASTRODUODENOSCOPY  08/03/2011   Procedure: ESOPHAGOGASTRODUODENOSCOPY (EGD);  Surgeon: Shann Medal, MD;  Location: [facility];  Service: General;  Laterality: N/A;  . ESOPHAGOGASTRODUODENOSCOPY (EGD) WITH PROPOFOL N/A 03/11/2014   Procedure: ESOPHAGOGASTRODUODENOSCOPY (EGD) WITH PROPOFOL;  Surgeon: Lafayette Dragon, MD;  Location: [facility] ENDOSCOPY;  Service: Endoscopy;  Laterality: N/A;  . FRACTURE SURGERY    . gall stone removal  05/2003  . GASTRIC RESTRICTION SURGERY  1984   "stapeling"  .  HERNIA REPAIR  2004   "in my stomach; had OR on it twice", wire mesh on 1 hernia  . LEFT HEART CATH AND CORS/GRAFTS ANGIOGRAPHY N/A 07/09/2016   Procedure: LEFT HEART CATH AND CORS/GRAFTS ANGIOGRAPHY;  Surgeon: Larey Dresser, MD;  Location: [facility] CV [facility];  Service: Cardiovascular;  Laterality: N/A;  . ORIF ANKLE FRACTURE Left 05/16/2018  . ORIF ANKLE FRACTURE Left 05/16/2018   Procedure: OPEN REDUCTION INTERNAL FIXATION (ORIF) Left ankle with possible syndesmosis fixation;  Surgeon: Nicholes Stairs, MD;  Location: [facility];  Service: Orthopedics;  Laterality: Left;  139min  . OVARY SURGERY  1970's   "tumor removed"  . PERCUTANEOUS CORONARY STENT INTERVENTION (PCI-S) N/A 06/13/2013   Procedure: PERCUTANEOUS CORONARY STENT INTERVENTION (PCI-S);  Surgeon: [name], MD;  Location: [facility] CATH LAB;  Service: Cardiovascular;  Laterality: N/A;  .  PERCUTANEOUS STENT INTERVENTION N/A 04/24/2013   Procedure: PERCUTANEOUS STENT INTERVENTION;  Surgeon: [name], MD;  Location: [facility] CATH LAB;  Service: Cardiovascular;  Laterality: N/A;  . RIGHT/LEFT HEART CATH AND CORONARY/GRAFT ANGIOGRAPHY N/A 10/20/2017   Procedure: RIGHT/LEFT HEART CATH AND CORONARY/GRAFT ANGIOGRAPHY;  Surgeon: Larey Dresser, MD;  Location: [facility];  Service: Cardiovascular;  Laterality: N/A;  . ROOT CANAL  10/2000  . TIBIA FRACTURE SURGERY Right 1970's   rods and pins  . TOOTH EXTRACTION     "1 on the upper; wisdom tooth on the lower" (01/16/2018)  . TOTAL ABDOMINAL HYSTERECTOMY  1970's   w/ appendectomy   HPI:  73 year old female admitted with slurred speech, fall, fevers. Has developed N/V since admit. CT-head showed new areas of hypoattenuation in the subcortical white matter of the right frontal lobe which may represent small age-indeterminate ischemic infarcts. MRI head on 09/10/18 indicated No acute ischemic infarct or intracranial hemorrhage, but changes were noted indicating late subacute to early chronic infarcts and severe stenosis of left PCA P1 and P2 segments and of both internal carotid arteries of the skull base.  Assessment / Plan / Recommendation Clinical Impression   The MOCA [facility] Cognitive Assessment) was administered and yielded a score of 23/30 with baseline short-term memory deficits and potential executive function tasks noted d/t prior CVAs; pt was not able to complete the writing portion today d/t receiving a new IV and site was sore from placement, so pt refused.  Pt aware of memory deficits and stated retrieval deficit when asked to complete recall task; intelligibility 100% with complex conversation, although SOB noted during speaking tasks with longer duration d/t current CHF vs PNA.  Pt is functioning at baseline level for speech/linguistic/cognitive skills at this time; no further ST recommended.  Thank you for this  consult.    SLP Assessment  SLP Recommendation/Assessment: Patient does not need any further Speech Language Pathology Services SLP Visit Diagnosis: Dysarthria and anarthria (R47.1)    Follow Up Recommendations  None    Frequency and Duration   n/a; evaluation only        SLP Evaluation Cognition  Overall Cognitive Status: Within Functional Limits for tasks assessed Arousal/Alertness: Awake/alert Orientation Level: Oriented X4 Memory: Impaired Memory Impairment: Retrieval deficit;Decreased short term memory;Other (comment)(baseline deficits) Awareness: Appears intact Problem Solving: Appears intact Safety/Judgment: Appears intact       Comprehension  Auditory Comprehension Overall Auditory Comprehension: Appears within functional limits for tasks assessed Yes/No Questions: Within Functional Limits Commands: Within Functional Limits Conversation: Complex Visual Recognition/Discrimination Discrimination: Within Function Limits Reading Comprehension Reading Status: Within funtional  limits    Expression Expression Primary Mode of Expression: Verbal Verbal Expression Overall Verbal Expression: Appears within functional limits for tasks assessed Initiation: No impairment Level of Generative/Spontaneous Verbalization: Conversation Repetition: No impairment Naming: No impairment Pragmatics: No impairment Non-Verbal Means of Communication: Not applicable Written Expression Dominant Hand: Right Written Expression: Unable to assess (comment)(Had new IV placed and dominant hand sore)   Oral / Motor  Oral Motor/Sensory Function Overall Oral Motor/Sensory Function: Within functional limits Motor Speech Overall Motor Speech: Appears within functional limits for tasks assessed Respiration: Within functional limits Phonation: Normal Resonance: Within functional limits Articulation: Within functional limitis Intelligibility: Intelligible Motor Planning: Witnin functional  limits Motor Speech Errors: Not applicable Interfering Components: Premorbid status(memory deficits)                       Elvina Sidle, M.S., CCC-SLP 09/11/2018, 2:53 PM

## 2018-09-11 NOTE — Evaluation (Signed)
Occupational Therapy Evaluation Patient Details Name: [name] [name] [name]: [id number] DOB: 02-Jan-1946 Today's Date: 09/11/2018    History of Present Illness Patient is a 73 y/o female who presents with slurred speech, falls, fever, dizziness, N/V and SOB. Found to have Acute systolic CHF, lobar pneumonia, acute respiratory failure and sepsis.Head CT-new area hypoattenuation in right frontal lobe. MRA-multiple areas of stenosis including severe b/l ICA stenosis. CXR-pulmonary edema.  PMH includes OA, CVA, MI, HTN, HLD, CKD, PVD, DM2, CHF, CAD, anxiety.   Clinical Impression   Pt was minimally assisted for showering, but otherwise independent in self care prior to admission. Her husband and housekeeper help with IADL. Pt admits to many falls. Pt presents with generalized weakness and impaired standing balance. She was having difficulty with her IV site bleeding so evaluation limited. Pt currently on 5L 02 with Sp02 >95%. Will follow acutely. Recommending return home with [place].     Follow Up Recommendations  Home health OT;Supervision/Assistance - 24 hour    Equipment Recommendations  None recommended by OT    Recommendations for Other Services       Precautions / Restrictions Precautions Precautions: Fall Precaution Comments: hx of falls at home (~8 in last 6 months) Restrictions Weight Bearing Restrictions: No      Mobility Bed Mobility Overal bed mobility: Needs Assistance Bed Mobility: Supine to Sit;Sit to Supine     Supine to sit: Min assist Sit to supine: Mod assist   General bed mobility comments: Increased time and effort, heavy use of rail and assist to scoot bottom towards EOB. Assisted for LEs back into bed.  Transfers Overall transfer level: Needs assistance Equipment used: Rolling walker (2 wheeled) Transfers: Sit to/from Stand Sit to Stand: Min assist;From elevated surface         General transfer comment: assist to rise and steady, took several steps along  EOB    Balance Overall balance assessment: Needs assistance;History of Falls   Sitting balance-Leahy Scale: Fair       Standing balance-Leahy Scale: Poor Standing balance comment: Reliant on BUEs and external support for standing balance.                           ADL either performed or assessed with clinical judgement   ADL Overall ADL's : Needs assistance/impaired Eating/Feeding: Independent;Sitting   Grooming: Set up;Sitting   Upper Body Bathing: Minimal assistance;Sitting   Lower Body Bathing: Minimal assistance;Sit to/from stand   Upper Body Dressing : Minimal assistance;Sitting   Lower Body Dressing: Minimal assistance;Sit to/from stand   Toilet Transfer: Minimal assistance;Ambulation;RW           Functional mobility during ADLs: Minimal assistance       Vision Baseline Vision/History: Wears glasses Wears Glasses: At all times Patient Visual Report: No change from baseline       Perception     Praxis      Pertinent Vitals/Pain Pain Assessment: No/denies pain     Hand Dominance Right   Extremity/Trunk Assessment Upper Extremity Assessment Upper Extremity Assessment: Generalized weakness   Lower Extremity Assessment Lower Extremity Assessment: Defer to PT evaluation       Communication Communication Communication: No difficulties   Cognition Arousal/Alertness: Awake/alert Behavior During Therapy: WFL for tasks assessed/performed Overall Cognitive Status: Within Functional Limits for tasks assessed  General Comments       Exercises     Shoulder Instructions      Home Living Family/patient expects to be discharged to:: Private residence Living Arrangements: Spouse/significant other Available Help at Discharge: Family;Available 24 hours/day Type of Home: House Home Access: Level entry     Home Layout: Two level;Bed/bath upstairs Alternate Level Stairs-Number of Steps:  has a stair lift   Bathroom Shower/Tub: Occupational psychologist: Standard     Home Equipment: Environmental consultant - 4 wheels;Shower seat          Prior Functioning/Environment Level of Independence: Needs assistance  Gait / Transfers Assistance Needed: Uses rollator for ambuation ADL's / Homemaking Assistance Needed: husband dries her back side and supervises shower transfer, pt can dress, husband does cooking and pt has a housekeeper   Comments: Has been doing [place] services. reports her balance is no great and she falls a lot.        OT Problem List: Decreased strength;Decreased activity tolerance;Impaired balance (sitting and/or standing);Decreased safety awareness;Decreased knowledge of use of DME or AE      OT Treatment/Interventions: Self-care/ADL training;DME and/or AE instruction;Patient/family education;Balance training;Therapeutic activities    OT Goals(Current goals can be found in the care plan section) Acute Rehab OT Goals Patient Stated Goal: to go  home with my husband OT Goal Formulation: With patient Time For Goal Achievement: 09/25/18 Potential to Achieve Goals: Good ADL Goals Pt Will Perform Grooming: with supervision;standing Pt Will Perform Lower Body Bathing: with supervision;sit to/from stand Pt Will Perform Lower Body Dressing: with supervision;sit to/from stand Pt Will Transfer to Toilet: with supervision;ambulating Pt Will Perform Toileting - Clothing Manipulation and hygiene: with supervision;sit to/from stand Additional ADL Goal #1: Pt will perform bed mobility modified independently in preparation for ADL.  OT Frequency: Min 2X/week   Barriers to D/C:            Co-evaluation              AM-PAC OT "6 Clicks" Daily Activity     Outcome Measure Help from another person eating meals?: None Help from another person taking care of personal grooming?: A Little Help from another person toileting, which includes using toliet, bedpan, or urinal?:  A Lot Help from another person bathing (including washing, rinsing, drying)?: A Lot Help from another person to put on and taking off regular upper body clothing?: A Little Help from another person to put on and taking off regular lower body clothing?: A Lot 6 Click Score: 16   End of Session Equipment Utilized During Treatment: Gait belt;Rolling walker Nurse Communication: Other (comment)(came to replace IV site)  Activity Tolerance: Treatment limited secondary to medical complications (Comment)(L IV site began bleeding upon standing, returned to supine) Patient left: in bed;with call bell/phone within reach;with nursing/sitter in room  OT Visit Diagnosis: Unsteadiness on feet (R26.81);Other abnormalities of gait and mobility (R26.89);Muscle weakness (generalized) (M62.81)                Time: 1610-9604 OT Time Calculation (min): 12 min Charges:  OT General Charges $OT Visit: 1 Visit OT Evaluation $OT Eval Moderate Complexity: 1 Mod  Nestor Lewandowsky, OTR/L Acute Rehabilitation Services Pager: [phone] Office: [phone]  Malka So 09/11/2018, 12:37 PM

## 2018-09-12 ENCOUNTER — Other Ambulatory Visit (HOSPITAL_COMMUNITY): Payer: Self-pay | Admitting: Cardiology

## 2018-09-12 LAB — CBC
HCT: 35.2 % — ABNORMAL LOW (ref 36.0–46.0)
HCT: 34.5 % — ABNORMAL LOW (ref 36.0–46.0)
Hemoglobin: 10.9 g/dL — ABNORMAL LOW (ref 12.0–15.0)
Hemoglobin: 10.7 g/dL — ABNORMAL LOW (ref 12.0–15.0)
MCH: 28.5 pg (ref 26.0–34.0)
MCH: 28.4 pg (ref 26.0–34.0)
MCHC: 31 g/dL (ref 30.0–36.0)
MCHC: 31 g/dL (ref 30.0–36.0)
MCV: 92.1 fL (ref 80.0–100.0)
MCV: 91.5 fL (ref 80.0–100.0)
Platelets: 192 10*3/uL (ref 150–400)
Platelets: 181 10*3/uL (ref 150–400)
RBC: 3.82 MIL/uL — ABNORMAL LOW (ref 3.87–5.11)
RBC: 3.77 MIL/uL — ABNORMAL LOW (ref 3.87–5.11)
RDW: 14.8 % (ref 11.5–15.5)
RDW: 14.8 % (ref 11.5–15.5)
WBC: 15 10*3/uL — ABNORMAL HIGH (ref 4.0–10.5)
WBC: 13.9 10*3/uL — ABNORMAL HIGH (ref 4.0–10.5)
nRBC: 0 % (ref 0.0–0.2)
nRBC: 0 % (ref 0.0–0.2)

## 2018-09-12 LAB — GLUCOSE, CAPILLARY
Glucose-Capillary: 205 mg/dL — ABNORMAL HIGH (ref 70–99)
Glucose-Capillary: 246 mg/dL — ABNORMAL HIGH (ref 70–99)
Glucose-Capillary: 259 mg/dL — ABNORMAL HIGH (ref 70–99)
Glucose-Capillary: 255 mg/dL — ABNORMAL HIGH (ref 70–99)

## 2018-09-12 LAB — BASIC METABOLIC PANEL
Anion gap: 14 (ref 5–15)
BUN: 26 mg/dL — ABNORMAL HIGH (ref 8–23)
CO2: 38 mmol/L — ABNORMAL HIGH (ref 22–32)
Calcium: 8.8 mg/dL — ABNORMAL LOW (ref 8.9–10.3)
Chloride: 83 mmol/L — ABNORMAL LOW (ref 98–111)
Creatinine, Ser: 1.24 mg/dL — ABNORMAL HIGH (ref 0.44–1.00)
GFR calc Af Amer: 51 mL/min — ABNORMAL LOW (ref >60)
GFR calc non Af Amer: 44 mL/min — ABNORMAL LOW (ref >60)
Glucose, Bld: 227 mg/dL — ABNORMAL HIGH (ref 70–99)
Potassium: 3.3 mmol/L — ABNORMAL LOW (ref 3.5–5.1)
Sodium: 135 mmol/L (ref 135–145)

## 2018-09-12 MED ORDER — INSULIN GLARGINE 100 UNIT/ML ~~LOC~~ SOLN
28.0000 [IU] | Freq: Every day | SUBCUTANEOUS | Status: DC
  Administered 2018-09-12: 28 [IU] via SUBCUTANEOUS
  Filled 2018-09-12 (×2): qty 0.28

## 2018-09-12 MED ORDER — POTASSIUM CHLORIDE CRYS ER 20 MEQ PO TBCR
40.0000 meq | EXTENDED_RELEASE_TABLET | ORAL | Status: AC
  Administered 2018-09-12 (×2): 40 meq via ORAL
  Filled 2018-09-12 (×2): qty 2

## 2018-09-12 MED ORDER — FUROSEMIDE 40 MG PO TABS
40.0000 mg | ORAL_TABLET | Freq: Two times a day (BID) | ORAL | Status: DC
  Administered 2018-09-12 – 2018-09-13 (×2): 40 mg via ORAL
  Filled 2018-09-12 (×2): qty 1

## 2018-09-12 NOTE — Progress Notes (Signed)
Rehab Admissions Coordinator Note:  Patient was screened by Michel Santee for appropriateness for an Inpatient Acute Rehab Consult.  At this time, we are recommending Inpatient Rehab consult. Please place a consult order if pt would like to be considered for CIR.   Michel Santee 09/12/2018, 10:56 AM  I can be reached at [phone].

## 2018-09-12 NOTE — Progress Notes (Signed)
   Please briefly explain why patient needs home oxygen:  Since placed back on O2 at 1L, has been satting 92-94%.  No signs of distress.  Did decrease O2 to 0.5L/hr.  Is now back to bed and has continuous pulse-ox on to monitor.  Informed her to let nurse know if she does become SOB or distressed.

## 2018-09-12 NOTE — Progress Notes (Signed)
Inpatient Diabetes Program Recommendations  AACE/ADA: New Consensus Statement on Inpatient Glycemic Control (2015)  Target Ranges:  Prepandial:   less than 140 mg/dL      Peak postprandial:   less than 180 mg/dL (1-2 hours)      Critically ill patients:  140 - 180 mg/dL   Lab Results  Component Value Date   GLUCAP 205 (H) 09/12/2018   HGBA1C 7.0 (H) 09/10/2018    Review of Glycemic Control Results for [name] [name], [name] [name] ([name] [id number]) as of 09/12/2018 10:46  Ref. Range 09/11/2018 06:18 09/11/2018 11:03 09/11/2018 15:38 09/11/2018 21:10 09/12/2018 06:12  Glucose-Capillary Latest Ref Range: 70 - 99 mg/dL 175 (H) 289 (H) 343 (H) 286 (H) 205 (H)   Diabetes history: DM2 Outpatient Diabetes medications: Lantus 38 units + Novolog 20 units tid meal coverage Current orders for Inpatient glycemic control: Lantus 26 units + Novolog 4 units tid meal coverage if eats 50% + Novolog sensitive correction tid  Inpatient Diabetes Program Recommendations:   Noted postprandial elevated last pm. -Increase Novolog to 8 units tid meal coverage if eats 50%  Thank you, Bethena Roys E. Calhoun Reichardt, RN, MSN, CDE  Diabetes Coordinator Inpatient Glycemic Control Team Team Pager [phone] (8am-5pm) 09/12/2018 10:47 AM

## 2018-09-12 NOTE — Progress Notes (Signed)
SATURATION QUALIFICATIONS: (This note is used to comply with regulatory documentation for home oxygen)  Patient Saturations on Room Air at Rest = 94% with 2L O2 then desatted to 74% after being on room air for a couple hours today.  This nurse had her to cough and deep breathe and it went up to 84% but was bouncing and dipping between 90%-84%.  She was resting at the time but kept dipping down.  Did apply 1 Liter of O2 and will tray again.  Said she did not feel SOB but needs O2 at night or when sleeping and she was starting to get sleepy. No cyanosis or signs of resp distress other than sat.

## 2018-09-12 NOTE — Care Management Important Message (Signed)
Important Message  Patient Details  Name: [name] [name] [name]: [id number] Date of Birth: [birth year]   Medicare Important Message Given:  Yes     Shelbia Scinto Montine Circle 09/12/2018, 1:15 PM

## 2018-09-12 NOTE — Progress Notes (Signed)
Physical Therapy Treatment Patient Details Name: [name] [name] [name]: [id number] DOB: 09/07/1945 Today's Date: 09/12/2018    History of Present Illness Patient is a 73 y/o female who presents with slurred speech, falls, fever, dizziness, N/V and SOB. Found to have Acute systolic CHF, lobar pneumonia, acute respiratory failure and sepsis.Head CT-new area hypoattenuation in right frontal lobe. MRA-multiple areas of stenosis including severe b/l ICA stenosis. CXR-pulmonary edema.  PMH includes OA, CVA, MI, HTN, HLD, CKD, PVD, DM2, CHF, CAD, anxiety.    PT Comments    Patient seen for mobility progression. Pt is pleasant and agreeable to participate in therapy. Pt is making progress toward PT goals and tolerated gait training distance of 60 ft with min/mod A and use of RW before fatigued. Pt agreeable to attempt further ambulatory distance however able to tolerate ~6 ft due to bilat LE weakness demonstrating decreased awareness to deficits. Chair follow for safety. Pt on 2L O2 via [place] upon arrival and with ambulation pt required 3L O2 due to SpO2 desat to 86% on 2L. SpO2 up to 97% at rest on 2L. Recommend CIR level therapies given pt's current mobility level, mobility progression, history of falls over past 6 months, and potential to reach supervision/mod I level prior to d/c home.       Follow Up Recommendations  CIR     Equipment Recommendations  None recommended by PT    Recommendations for Other Services       Precautions / Restrictions Precautions Precautions: Fall Precaution Comments: hx of falls at home (~8 in last 6 months) Restrictions Weight Bearing Restrictions: No    Mobility  Bed Mobility Overal bed mobility: Needs Assistance Bed Mobility: Supine to Sit     Supine to sit: Min assist     General bed mobility comments: use of rail and increased time and effort needed; assist to bring hips to EOB with use of bed pad  Transfers Overall transfer level: Needs  assistance Equipment used: Rolling walker (2 wheeled) Transfers: Sit to/from Stand Sit to Stand: Min guard         General transfer comment: min guard for safety from EOB and recliner; cues for safe hand placement  Ambulation/Gait Ambulation/Gait assistance: Min assist;Mod assist Gait Distance (Feet): (60 ft then ~6 ft) Assistive device: Rolling walker (2 wheeled) Gait Pattern/deviations: Step-through pattern;Step-to pattern;Decreased stride length Gait velocity: decreased   General Gait Details: pt with decreased cadence and stride length; initial gait bout pt demonstrates R LE weakness and near buckling and after seate rest break pt agreeable to attempt further ambulation however bilat LE weakness was significant and tolerated only ~6 ft; chair follow for safety    Stairs             Wheelchair Mobility    Modified Rankin (Stroke Patients Only) Modified Rankin (Stroke Patients Only) Pre-Morbid Rankin Score: Moderately severe disability Modified Rankin: Moderately severe disability     Balance Overall balance assessment: Needs assistance;History of Falls Sitting-balance support: Feet supported;No upper extremity supported Sitting balance-Leahy Scale: Fair     Standing balance support: During functional activity Standing balance-Leahy Scale: Poor                              Cognition Arousal/Alertness: Awake/alert Behavior During Therapy: WFL for tasks assessed/performed Overall Cognitive Status: Within Functional Limits for tasks assessed  Exercises      General Comments General comments (skin integrity, edema, etc.): SpO2 desat to 86% on 2L O2 via [place] with ambulation and up to 97% while at rest      Pertinent Vitals/Pain Pain Assessment: No/denies pain    Home Living                      Prior Function            PT Goals (current goals can now be found in the care plan  section) Acute Rehab PT Goals Patient Stated Goal: to go  home with my husband Progress towards PT goals: Progressing toward goals    Frequency    Min 3X/week      PT Plan Discharge plan needs to be updated;Frequency needs to be updated    Co-evaluation              AM-PAC PT "6 Clicks" Mobility   Outcome Measure  Help needed turning from your back to your side while in a flat bed without using bedrails?: A Little Help needed moving from lying on your back to sitting on the side of a flat bed without using bedrails?: A Little Help needed moving to and from a bed to a chair (including a wheelchair)?: A Little Help needed standing up from a chair using your arms (e.g., wheelchair or bedside chair)?: A Little Help needed to walk in hospital room?: A Lot Help needed climbing 3-5 steps with a railing? : Total 6 Click Score: 15    End of Session Equipment Utilized During Treatment: Gait belt;Oxygen(7L/min 02 HF [place]) Activity Tolerance: Patient tolerated treatment well Patient left: in chair;with call bell/phone within reach Nurse Communication: Mobility status PT Visit Diagnosis: Pain;Muscle weakness (generalized) (M62.81);Unsteadiness on feet (R26.81) Pain - Right/Left: Left Pain - part of body: Leg(back)     Time: 6381-7711 PT Time Calculation (min) (ACUTE ONLY): 34 min  Charges:  $Gait Training: 23-37 mins                     Earney Navy, PTA Acute Rehabilitation Services Pager: [phone] Office: [phone]     Darliss Cheney 09/12/2018, 9:59 AM

## 2018-09-12 NOTE — Progress Notes (Signed)
Inpatient Rehabilitation Admissions Coordinator  Inpatient rehab consult received. I met with patient and her spouse at bedside for rehab assessment. Patient has been home from SNF for 3 1/2 weeks after being at [facility] in [place] for 3 months after MI and ankle fracture. They prefer an inpt rehab admit. I discussed goals and expectations of an inpt rehab stay. I will discuss with rehab MD and follow up tomorrow for eligibility for a possible inpt rehab admit prior to return home with family.   , RN, MSN Rehab Admissions Coordinator [phone] 09/12/2018 2:24 PM ' 

## 2018-09-12 NOTE — Discharge Instructions (Signed)

## 2018-09-12 NOTE — TOC Initial Note (Signed)
Transition of Care [facility]) - Initial/Assessment Note    Patient Details  Name: [name] [name] [name]: [id number] Date of Birth: May 07, 1945  [facility]) CM/SW Contact:    Pollie Friar, RN Phone Number: 09/12/2018, 4:16 PM  Clinical Narrative:                 Daughter does her medication box.  Spouse provides needed transportation. Recommendations are for CIR. TOC following for d/c disposition.  Expected Discharge Plan: IP Rehab Facility Barriers to Discharge: Continued Medical Work up   Patient Goals and CMS Choice        Expected Discharge Plan and Services Expected Discharge Plan: [place]   Discharge Planning Services: CM Consult Post Acute Care Choice: IP Rehab Living arrangements for the past 2 months: Single Family Home                                      Prior Living Arrangements/Services Living arrangements for the past 2 months: Single Family Home Lives with:: Spouse Patient language and need for interpreter reviewed:: Yes(no needs) Do you feel safe going back to the place where you live?: Yes      Need for Family Participation in Patient Care: Yes (Comment) Care giver support system in place?: Yes (comment)(spouse and daughter able to provide 24 hour supervision) Current home services: DME, Home RN(home oxygen through [employer]/ walker, wheelchair, stairs lift, shower seat//  [employer] [facility]) Criminal Activity/Legal Involvement Pertinent to Current Situation/Hospitalization: No - Comment as needed  Activities of Daily Living Home Assistive Devices/Equipment: Wheelchair, Shower chair without back, CBG Meter, Walker (specify type) ADL Screening (condition at time of admission) Patient's cognitive ability adequate to safely complete daily activities?: Yes Is the patient deaf or have difficulty hearing?: Yes Does the patient have difficulty seeing, even when wearing glasses/contacts?: No Does the patient have difficulty  concentrating, remembering, or making decisions?: No Patient able to express need for assistance with ADLs?: Yes Does the patient have difficulty dressing or bathing?: Yes Independently performs ADLs?: No Communication: Independent Dressing (OT): Independent Grooming: Independent Feeding: Independent Bathing: Independent Toileting: Independent In/Out Bed: Independent Walks in Home: Independent with device (comment)(Uses a walker at home) Does the patient have difficulty walking or climbing stairs?: Yes Weakness of Legs: Both Weakness of Arms/Hands: Both  Permission Sought/Granted                  Emotional Assessment Appearance:: Appears stated age Attitude/Demeanor/Rapport: Engaged Affect (typically observed): Accepting, Pleasant Orientation: : Oriented to Self, Oriented to Place, Oriented to  Time, Oriented to Situation   Psych Involvement: No (comment)  Admission diagnosis:  Cough [R05] Pain [R52] Febrile illness [R50.9] Cerebrovascular accident (CVA), unspecified mechanism ([place]) [I63.9] Slurred speech [R47.81] Patient Active Problem List   Diagnosis Date Noted  . Fall 09/09/2018  . Stroke ([place]) 09/09/2018  . Type II diabetes mellitus with renal manifestations ([place]) 09/09/2018  . CKD (chronic kidney disease), stage III ([place]) 09/09/2018  . Sepsis ([place]) 09/09/2018  . Depression with anxiety 09/09/2018  . Slurred speech 09/09/2018  . Ankle fracture, left 05/16/2018  . Amiodarone pulmonary toxicity   . Physical deconditioning   . 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 Acute on chronic congestive heart failure ([place])   . Chronic systolic CHF (congestive heart failure) ([place]) 01/16/2018  . Acute on chronic diastolic CHF (congestive heart failure) ([place]) 01/16/2018  .  Acute on chronic diastolic (congestive) heart failure ([place]) 01/16/2018  . Atrial fibrillation ([place]) 10/21/2017  . CAD (coronary artery disease) of bypass graft 10/20/2017  . Coronary artery disease of bypass  graft of native heart with stable angina pectoris ([place])   . Chronic low back pain 08/24/2016  . Pain of left thumb 08/05/2016  . Nocturnal hypoxia 06/02/2016  . Hoarseness 04/05/2016  . Laryngopharyngeal reflux (LPR) 04/05/2016  . Pharyngoesophageal dysphagia 04/05/2016  . Angina decubitus ([place]) 05/22/2015  . Chronic insomnia 05/06/2015  . Common migraine 05/14/2014  . Abnormality of gait 05/14/2014  . Memory change 05/14/2014  . Aneurysm, cerebral, nonruptured 05/14/2014  . Cramp of limb-Left neck 05/10/2014  . Hematemesis with nausea   . Vomiting blood   . Dizziness and giddiness 09/21/2013  . Atypical chest pain 07/18/2013  . Unstable angina ([place]) 04/09/2013  . Carotid artery stenosis, symptomatic 03/20/2013  . Chronic diastolic CHF (congestive heart failure) ([place]) 09/23/2012  . Cerebrovascular disease 07/10/2012  . Cerebral artery occlusion with cerebral infarction ([place]) 07/10/2012  . Mitral regurgitation 04/15/2012  . TIA (transient ischemic attack) 04/15/2012  . Occlusion and stenosis of carotid artery without mention of cerebral infarction 08/18/2011  . Bariatric surgery status 06/17/2011  . Speech abnormality 03/22/2011  . Dyspnea 02/24/2011  . PAPILLARY MUSCLE DYSFUNCTION, NON-RHEUMATIC 10/09/2008  . UNSPECIFIED VITAMIN D DEFICIENCY 10/24/2007  . MYOCARDIAL INFARCTION, HX OF 10/24/2007  . PERSISTENT VOMITING 10/24/2007  . OSTEOARTHRITIS 10/24/2007  . MIGRAINES, HX OF 10/24/2007  . DM 01/16/2007  . Hyperlipemia 01/16/2007  . Obesity-post failed open gastroplasty 1984  01/16/2007  . OBSTRUCTIVE SLEEP APNEA 01/16/2007  . Essential hypertension 01/16/2007  . Coronary atherosclerosis 01/16/2007  . Asthma 01/16/2007  . GERD 01/16/2007  . VENTRAL HERNIA 01/16/2007   PCP:  Derinda Late, MD Pharmacy:   [facility], [place] - [address] [id number] Phone: [phone] Fax: [phone]     Social  Determinants of Health (SDOH) Interventions    Readmission Risk Interventions Readmission Risk Prevention Plan 05/17/2018  Transportation Screening Complete  PCP or Specialist Appt within 3-5 Days Complete  HRI or [place] Complete  Social Work Consult for [place] Planning/Counseling Complete  Palliative Care Screening Not Applicable  Medication Review Press photographer) Complete  Some recent data might be hidden

## 2018-09-12 NOTE — Progress Notes (Signed)
PROGRESS NOTE    [name] [name]   HYI:[id number]  DOB: 06/11/1945  DOA: 09/09/2018 PCP: Derinda Late, MD   Brief Narrative:  [name] [name] is a 73 y.o. female with medical history significant of hypertension, hyperlipidemia, diabetes mellitus, asthma, stroke, GERD, depression with anxiety, PVD, OSA not on CPAP, CAD, stent, CABG, sCHF with EF 25%, memory change, atrial fibrillation on Eliquis, left bundle blockage, CKD stage III, who presents with slurred speech, dizziness, fall, fever. She has fallen 3 x at home.   In ED > WBC 14.5, Temp 100.9, tachypnea CXR > vascular congestion/ pulm edema CT head > New areas of hypoattenuation in the subcortical white matter of the right frontal lobe may represent small age-indeterminate ischemic infarcts.  MRI> Multiple rounded areas of peripheral hyperintense T2-weighted signal within the bilateral supratentorial white matter, corresponding to the areas of hypoattenuation on the earlier head CT. These likely indicate late subacute to early chronic infarcts, not present on prior MRIs. No diffusion restriction to indicate acute ischemia.  MRA> multiple areas of stenosis including severe b/l ICA stenosis- see full report below  Subjective: She is doing better today. Cough is less and dyspnea is better.     Assessment & Plan:   Principal Problem:   Slurred speech-  -Neuro feels that she has recrudescence of previous symptoms due to her pneumonia - MRI/ MRA reports above-neuro does not feel that any changes needs to be made to her medication regimen-continue Eliquis  Active Problems: Acute respiratory failure-severe sepsis Acute systolic CHF and lobar pneumonia - CXR in ED suggestive of pulmonary edema - noted to have fevers, leukocytosis and complained to me of vomiting for about 2 days prior to admission- may have aspirated but allergic to Unasyn and Flagyl - repeat chest x-ray 8/9 CXR showing pulmonary edema and developing  bronchopneumonia in RLL - MRSA + - cont Vanc and Cefepime - fevers and leukocytosis improving -Continue to diurese but will change to oral Lasix today -  still hypoxic- wean O2 as able  CKD 3 - Cr better today  Hypokalemia -Potassium is 2.8 yesterday, likely secondary to Lasix- replaced- will replace again today - Mg normal   Paroxysmal Atrial fibrillation  - cont Eliquis and Toprol     Falls - ? Due to hypoxia vs recent CVAs - PT eval- recommending CIR  Nausea - Vomiting - feels that this has resolved- tolerating diet  Type II diabetes mellitus with renal manifestations  - A1c 7 - cont Insulin and follow sugars- mealtime insulin added yesterday- increase Lantus today    CKD (chronic kidney disease), stage III  - follow while diuresing  Asthma - cont nebs PRN- no wheezing at the moment- no exacerbation     Depression with anxiety - Cymbalta + Wellbutrin    Hyperlipemia - cont Statin + Zetia    Essential hypertension - cont Norvasc, Toprol & diuretics    GERD - cont Protonix    CAD (coronary artery disease) of bypass graft - on Plavix, statin, Eliquis  Time spent in minutes: 35 DVT prophylaxis: Eliquis Code Status: Full code Family Communication:  Disposition Plan: PT/OT recommends CIR today Consultants:   Neuro Procedures:     Antimicrobials:  Anti-infectives (From admission, onward)   Start     Dose/Rate Route Frequency Ordered Stop   09/10/18 2100  vancomycin (VANCOCIN) IVPB 1000 mg/200 mL premix     1,000 mg 200 mL/hr over 60 Minutes Intravenous Every 24 hours 09/09/18 2037  09/09/18 2100  ceFEPIme (MAXIPIME) 2 g in sodium chloride 0.9 % 100 mL IVPB     2 g 200 mL/hr over 30 Minutes Intravenous Every 12 hours 09/09/18 2030     09/09/18 2045  vancomycin (VANCOCIN) 2,000 mg in sodium chloride 0.9 % 500 mL IVPB     2,000 mg 250 mL/hr over 120 Minutes Intravenous  Once 09/09/18 2030 09/09/18 2309   09/09/18 2000  levofloxacin (LEVAQUIN) IVPB 500  mg  Status:  Discontinued     500 mg 100 mL/hr over 60 Minutes Intravenous  Once 09/09/18 1911 09/09/18 2010       Objective: Vitals:   09/12/18 0353 09/12/18 0834 09/12/18 0836 09/12/18 0942  BP: (!) 149/66   (!) 124/47  Pulse: 67   67  Resp: 17   18  Temp: 97.7 F (36.5 C)   97.7 F (36.5 C)  TempSrc: Oral   Oral  SpO2: 93% 100% 100% 94%  Weight:      Height:        Intake/Output Summary (Last 24 hours) at 09/12/2018 1137 Last data filed at 09/12/2018 3893 Gross per 24 hour  Intake 828.41 ml  Output 1900 ml  Net -1071.59 ml   Filed Weights   09/09/18 2015  Weight: 116.2 kg    Examination: General exam: Appears comfortable  HEENT: PERRLA, oral mucosa moist, no sclera icterus or thrush Respiratory system: crackles at bases and coarse breath sounds-  Respiratory effort normal. Cardiovascular system: S1 & S2 heard,  No murmurs  Gastrointestinal system: Abdomen soft, non-tender, nondistended. Normal bowel sounds   Central nervous system: Alert and oriented. No focal neurological deficits. Extremities: No cyanosis, clubbing or edema Skin: No rashes or ulcers Psychiatry:  Mood & affect appropriate.     Data Reviewed: I have personally reviewed following labs and imaging studies  CBC: Recent Labs  Lab 09/09/18 1440 09/10/18 0936 09/11/18 0647 09/12/18 0309 09/12/18 0742  WBC 14.5* 22.1* 12.9* 15.0* 13.9*  NEUTROABS 11.7*  --   --   --   --   HGB 11.4* 11.7* 10.8* 10.9* 10.7*  HCT 36.8 37.6 35.3* 35.2* 34.5*  MCV 92.2 91.9 93.1 92.1 91.5  PLT 237 233 170 192 734   Basic Metabolic Panel: Recent Labs  Lab 09/09/18 1440 09/10/18 0925 09/11/18 0647 09/12/18 0742  NA 138 139 140 135  K 3.3* 3.2* 2.8* 3.3*  CL 99 94* 90* 83*  CO2 25 29 38* 38*  GLUCOSE 210* 306* 196* 227*  BUN 11 12 22  26*  CREATININE 1.13* 1.42* 1.41* 1.24*  CALCIUM 9.3 9.0 8.6* 8.8*  MG  --  2.4 2.1  --    GFR: Estimated Creatinine Clearance: 51.5 mL/min (A) (by C-G formula based  on SCr of 1.24 mg/dL (H)). Liver Function Tests: Recent Labs  Lab 09/09/18 1440 09/10/18 0925  AST 18 17  ALT 15 15  ALKPHOS 94 86  BILITOT 1.8* 2.3*  PROT 6.7 6.7  ALBUMIN 3.4* 3.2*   No results for input(s): LIPASE, AMYLASE in the last 168 hours. No results for input(s): AMMONIA in the last 168 hours. Coagulation Profile: No results for input(s): INR, PROTIME in the last 168 hours. Cardiac Enzymes: No results for input(s): CKTOTAL, CKMB, CKMBINDEX, TROPONINI in the last 168 hours. BNP (last 3 results) No results for input(s): PROBNP in the last 8760 hours. HbA1C: Recent Labs    09/10/18 0936  HGBA1C 7.0*   CBG: Recent Labs  Lab 09/11/18 0618 09/11/18 1103 09/11/18  1538 09/11/18 2110 09/12/18 0612  GLUCAP 175* 289* 343* 286* 205*   Lipid Profile: Recent Labs    09/10/18 0925  CHOL 103  HDL 37*  LDLCALC 48  TRIG 90  CHOLHDL 2.8   Thyroid Function Tests: No results for input(s): TSH, T4TOTAL, FREET4, T3FREE, THYROIDAB in the last 72 hours. Anemia Panel: No results for input(s): VITAMINB12, FOLATE, FERRITIN, TIBC, IRON, RETICCTPCT in the last 72 hours. Urine analysis:    Component Value Date/Time   COLORURINE YELLOW 09/09/2018 [place] 09/09/2018 1542   LABSPEC 1.008 09/09/2018 1542   PHURINE 5.0 09/09/2018 1542   GLUCOSEU NEGATIVE 09/09/2018 1542   HGBUR SMALL (A) 09/09/2018 1542   BILIRUBINUR NEGATIVE 09/09/2018 1542   KETONESUR NEGATIVE 09/09/2018 1542   PROTEINUR 100 (A) 09/09/2018 1542   UROBILINOGEN 1.0 04/16/2011 0954   NITRITE NEGATIVE 09/09/2018 1542   LEUKOCYTESUR NEGATIVE 09/09/2018 1542   Sepsis Labs: @LABRCNTIP (procalcitonin:4,lacticidven:4) ) Recent Results (from the past 240 hour(s))  SARS Coronavirus 2 [facility] order, Performed in [facility] hospital lab) Nasopharyngeal Urine, Clean Catch     Status: None   Collection Time: 09/09/18  3:40 PM   Specimen: Urine, Clean Catch; Nasopharyngeal  Result Value Ref Range  Status   SARS Coronavirus 2 NEGATIVE NEGATIVE Final    Comment: (NOTE) If result is NEGATIVE SARS-CoV-2 target nucleic acids are NOT DETECTED. The SARS-CoV-2 RNA is generally detectable in upper and lower  respiratory specimens during the acute phase of infection. The lowest  concentration of SARS-CoV-2 viral copies this assay can detect is 250  copies / mL. A negative result does not preclude SARS-CoV-2 infection  and should not be used as the sole basis for treatment or other  patient management decisions.  A negative result may occur with  improper specimen collection / handling, submission of specimen other  than nasopharyngeal swab, presence of viral mutation(s) within the  areas targeted by this assay, and inadequate number of viral copies  (<250 copies / mL). A negative result must be combined with clinical  observations, patient history, and epidemiological information. If result is POSITIVE SARS-CoV-2 target nucleic acids are DETECTED. The SARS-CoV-2 RNA is generally detectable in upper and lower  respiratory specimens dur ing the acute phase of infection.  Positive  results are indicative of active infection with SARS-CoV-2.  Clinical  correlation with patient history and other diagnostic information is  necessary to determine patient infection status.  Positive results do  not rule out bacterial infection or co-infection with other viruses. If result is PRESUMPTIVE POSTIVE SARS-CoV-2 nucleic acids MAY BE PRESENT.   A presumptive positive result was obtained on the submitted specimen  and confirmed on repeat testing.  While 2019 novel coronavirus  (SARS-CoV-2) nucleic acids may be present in the submitted sample  additional confirmatory testing may be necessary for epidemiological  and / or clinical management purposes  to differentiate between  SARS-CoV-2 and other Sarbecovirus currently known to infect humans.  If clinically indicated additional testing with an alternate  test  methodology [phone]) is advised. The SARS-CoV-2 RNA is generally  detectable in upper and lower respiratory sp ecimens during the acute  phase of infection. The expected result is Negative. Fact Sheet for Patients:  StrictlyIdeas.no Fact Sheet for Healthcare Providers: BankingDealers.co.za This test is not yet approved or cleared by the [place] FDA and has been authorized for detection and/or diagnosis of SARS-CoV-2 by FDA under an Emergency Use Authorization (EUA).  This EUA will remain in  effect (meaning this test can be used) for the duration of the COVID-19 declaration under Section 564(b)(1) of the Act, 21 U.S.C. section 360bbb-3(b)(1), unless the authorization is terminated or revoked sooner. Performed at [facility], [place] [address], [place], [place] [address]   Urine Culture     Status: None   Collection Time: 09/09/18  3:40 PM   Specimen: Urine, Clean Catch  Result Value Ref Range Status   Specimen Description URINE, CLEAN CATCH  Final   Special Requests NONE  Final   Culture   Final    NO GROWTH Performed at [facility], [place] [address] [place], [place] [address]    Report Status 09/10/2018 FINAL  Final  Blood culture (routine x 2)     Status: None (Preliminary result)   Collection Time: 09/09/18  6:50 PM   Specimen: BLOOD  Result Value Ref Range Status   Specimen Description BLOOD LEFT FINGER  Final   Special Requests   Final    BOTTLES DRAWN AEROBIC AND ANAEROBIC Blood Culture adequate volume   Culture   Final    NO GROWTH 2 DAYS Performed at [facility], [place] [address], [place], [place] [address]    Report Status PENDING  Incomplete  Blood culture (routine x 2)     Status: None (Preliminary result)   Collection Time: 09/09/18  6:52 PM   Specimen: BLOOD  Result Value Ref Range Status   Specimen Description BLOOD BLOOD LEFT WRIST  Final   Special Requests   Final    BOTTLES DRAWN  AEROBIC AND ANAEROBIC Blood Culture adequate volume   Culture   Final    NO GROWTH 2 DAYS Performed at [facility], [place] [address] [place], [place] [address]    Report Status PENDING  Incomplete  Respiratory Panel by PCR     Status: None   Collection Time: 09/10/18 10:31 AM   Specimen: Nasopharyngeal Swab; Respiratory  Result Value Ref Range Status   Adenovirus NOT DETECTED NOT DETECTED Final   Coronavirus 229E NOT DETECTED NOT DETECTED Final    Comment: (NOTE) The Coronavirus on the Respiratory Panel, DOES NOT test for the novel  Coronavirus (2019 nCoV)    Coronavirus HKU1 NOT DETECTED NOT DETECTED Final   Coronavirus NL63 NOT DETECTED NOT DETECTED Final   Coronavirus OC43 NOT DETECTED NOT DETECTED Final   Metapneumovirus NOT DETECTED NOT DETECTED Final   Rhinovirus / Enterovirus NOT DETECTED NOT DETECTED Final   Influenza A NOT DETECTED NOT DETECTED Final   Influenza B NOT DETECTED NOT DETECTED Final   Parainfluenza Virus 1 NOT DETECTED NOT DETECTED Final   Parainfluenza Virus 2 NOT DETECTED NOT DETECTED Final   Parainfluenza Virus 3 NOT DETECTED NOT DETECTED Final   Parainfluenza Virus 4 NOT DETECTED NOT DETECTED Final   Respiratory Syncytial Virus NOT DETECTED NOT DETECTED Final   Bordetella pertussis NOT DETECTED NOT DETECTED Final   Chlamydophila pneumoniae NOT DETECTED NOT DETECTED Final   Mycoplasma pneumoniae NOT DETECTED NOT DETECTED Final    Comment: Performed at [facility], [place]. [address], [place], [place] [address]  MRSA PCR Screening     Status: Abnormal   Collection Time: 09/10/18  2:10 PM   Specimen: Nasal Mucosa; Nasopharyngeal  Result Value Ref Range Status   MRSA by PCR POSITIVE (A) NEGATIVE Final    Comment:        The GeneXpert MRSA Assay (FDA approved for NASAL specimens only),  is one component of a comprehensive MRSA colonization surveillance program. It is not intended to diagnose MRSA infection nor to guide or monitor treatment  for MRSA infections. RESULT CALLED TO, READ BACK BY AND VERIFIED WITH: RN Evelene Croon [id number] Performed at [place] [address], [place], [place] [address]          Radiology Studies: Vas US Carotid  Result Date: 09/11/2018 Carotid Arterial Duplex Study Indications:       CVA. Risk Factors:      Hypertension, hyperlipidemia, Diabetes, coronary artery                    disease, prior CVA. Comparison Study:  05/23/17 Performing Technologist: Abram Sander RVS  Examination Guidelines: A complete evaluation includes B-mode imaging, spectral Doppler, color Doppler, and power Doppler as needed of all accessible portions of each vessel. Bilateral testing is considered an integral part of a complete examination. Limited examinations for reoccurring indications may be performed as noted.  Right Carotid Findings: +----------+--------+--------+--------+------------+---------+             PSV cm/s EDV cm/s Stenosis Describe     Comments   +----------+--------+--------+--------+------------+---------+  CCA Prox   34       8                 heterogenous            +----------+--------+--------+--------+------------+---------+  CCA Distal 51       11                heterogenous            +----------+--------+--------+--------+------------+---------+  ICA Prox   66       25       1-39%    heterogenous Shadowing  +----------+--------+--------+--------+------------+---------+  ICA Distal 59       30                                        +----------+--------+--------+--------+------------+---------+  ECA        466               >50%                             +----------+--------+--------+--------+------------+---------+ +----------+--------+-------+--------+-------------------+             PSV cm/s EDV cms Describe Arm Pressure (mmHG)  +----------+--------+-------+--------+-------------------+  Subclavian 86                                              +----------+--------+-------+--------+-------------------+ +---------+--------+--+--------+-+---------+  Vertebral PSV cm/s 30 EDV cm/s 9 Antegrade  +---------+--------+--+--------+-+---------+  Left Carotid Findings: +----------+--------+--------+--------+------------+--------+             PSV cm/s EDV cm/s Stenosis Describe     Comments  +----------+--------+--------+--------+------------+--------+  CCA Prox   70       19                heterogenous           +----------+--------+--------+--------+------------+--------+  CCA Distal 52       16                heterogenous           +----------+--------+--------+--------+------------+--------+  ECA        392               >50%                            +----------+--------+--------+--------+------------+--------+ +----------+--------+--------+--------+-------------------+  Subclavian PSV cm/s EDV cm/s Describe Arm Pressure (mmHG)  +----------+--------+--------+--------+-------------------+             85                                              +----------+--------+--------+--------+-------------------+ +---------+--------+--+--------+--+---------+  Vertebral PSV cm/s 93 EDV cm/s 25 Antegrade  +---------+--------+--+--------+--+---------+  Left Stent(s): +---------------+--+--+-------------+++  Prox to Stent   64 16                  +---------------+--+--+-------------+++  Proximal Stent  72 15 <50% stenosis    +---------------+--+--+-------------+++  Mid Stent       75 35                  +---------------+--+--+-------------+++  Distal Stent    78 37                  +---------------+--+--+-------------+++  Distal to Stent 97 46                  +---------------+--+--+-------------+++  Summary: Right Carotid: Velocities in the right ICA are consistent with a 1-39% stenosis.                The ECA appears >50% stenosed. Left Carotid: ICA stent <50% stenosis. The ECA appears >50% stenosed. Vertebrals: Bilateral vertebral arteries demonstrate antegrade flow.  *See table(s) above for measurements and observations.  Electronically signed by Harold Barban MD on 09/11/2018 at 10:07:51 AM.    Final       Scheduled Meds:  ALPRAZolam  0.5 mg Oral BID   amLODipine  10 mg Oral Daily   apixaban  5 mg Oral BID   budesonide  0.25 mg Nebulization BID   buPROPion  150 mg Oral Daily   Chlorhexidine Gluconate Cloth  6 each Topical Q0600   clopidogrel  75 mg Oral QHS   DULoxetine  30 mg Oral Daily   ezetimibe  10 mg Oral QHS   furosemide  40 mg Intravenous BID   gabapentin  600 mg Oral BID   insulin aspart  0-9 Units Subcutaneous TID WC   insulin aspart  4 Units Subcutaneous TID WC   insulin glargine  26 Units Subcutaneous QHS   metolazone  2.5 mg Oral Daily   metoprolol succinate  50 mg Oral Daily   mometasone-formoterol  2 puff Inhalation BID   multivitamin  1 tablet Oral Daily   mupirocin ointment  1 application Nasal BID   pantoprazole  40 mg Oral Daily   ranolazine  1,000 mg Oral Q12H   rosuvastatin  40 mg Oral QHS   sodium chloride flush  3 mL Intravenous Once   triamcinolone cream  1 application Topical Daily   vitamin B-12  1,000 mcg Oral Daily   zolpidem  5 mg Oral QHS   Continuous Infusions:  sodium chloride Stopped (09/11/18 2318)   ceFEPime (MAXIPIME) IV 2 g (09/12/18 0848)   vancomycin 1,000 mg (09/11/18 2318)     LOS: 3 days  Debbe Odea, MD [facility] Pager: [email or web address] Password [facility] 09/12/2018, 11:37 AM

## 2018-09-13 ENCOUNTER — Encounter (HOSPITAL_COMMUNITY): Payer: Self-pay | Admitting: *Deleted

## 2018-09-13 ENCOUNTER — Other Ambulatory Visit: Payer: Self-pay

## 2018-09-13 ENCOUNTER — Inpatient Hospital Stay (HOSPITAL_COMMUNITY)
Admission: RE | Admit: 2018-09-13 | Discharge: 2018-09-18 | DRG: 945 | Disposition: A | Discharge status: Other Acute Inpatient Hospital | Payer: Medicare Other | Source: Intra-hospital | Attending: Physical Medicine & Rehabilitation | Admitting: Physical Medicine & Rehabilitation

## 2018-09-13 DIAGNOSIS — Z833 Family history of diabetes mellitus: Secondary | ICD-10-CM

## 2018-09-13 DIAGNOSIS — I69322 Dysarthria following cerebral infarction: Secondary | ICD-10-CM | POA: Diagnosis not present

## 2018-09-13 DIAGNOSIS — I5031 Acute diastolic (congestive) heart failure: Secondary | ICD-10-CM | POA: Diagnosis not present

## 2018-09-13 DIAGNOSIS — E876 Hypokalemia: Secondary | ICD-10-CM | POA: Diagnosis present

## 2018-09-13 DIAGNOSIS — Z7902 Long term (current) use of antithrombotics/antiplatelets: Secondary | ICD-10-CM

## 2018-09-13 DIAGNOSIS — I951 Orthostatic hypotension: Secondary | ICD-10-CM | POA: Diagnosis not present

## 2018-09-13 DIAGNOSIS — Z90710 Acquired absence of both cervix and uterus: Secondary | ICD-10-CM

## 2018-09-13 DIAGNOSIS — I252 Old myocardial infarction: Secondary | ICD-10-CM

## 2018-09-13 DIAGNOSIS — K5900 Constipation, unspecified: Secondary | ICD-10-CM | POA: Diagnosis present

## 2018-09-13 DIAGNOSIS — E785 Hyperlipidemia, unspecified: Secondary | ICD-10-CM | POA: Diagnosis present

## 2018-09-13 DIAGNOSIS — I340 Nonrheumatic mitral (valve) insufficiency: Secondary | ICD-10-CM | POA: Diagnosis not present

## 2018-09-13 DIAGNOSIS — I69392 Facial weakness following cerebral infarction: Secondary | ICD-10-CM | POA: Diagnosis not present

## 2018-09-13 DIAGNOSIS — N183 Chronic kidney disease, stage 3 (moderate): Secondary | ICD-10-CM | POA: Diagnosis present

## 2018-09-13 DIAGNOSIS — I639 Cerebral infarction, unspecified: Secondary | ICD-10-CM | POA: Diagnosis present

## 2018-09-13 DIAGNOSIS — I5032 Chronic diastolic (congestive) heart failure: Secondary | ICD-10-CM | POA: Diagnosis not present

## 2018-09-13 DIAGNOSIS — L409 Psoriasis, unspecified: Secondary | ICD-10-CM | POA: Diagnosis present

## 2018-09-13 DIAGNOSIS — J180 Bronchopneumonia, unspecified organism: Secondary | ICD-10-CM | POA: Diagnosis present

## 2018-09-13 DIAGNOSIS — I25119 Atherosclerotic heart disease of native coronary artery with unspecified angina pectoris: Secondary | ICD-10-CM | POA: Diagnosis present

## 2018-09-13 DIAGNOSIS — I5042 Chronic combined systolic (congestive) and diastolic (congestive) heart failure: Secondary | ICD-10-CM | POA: Diagnosis present

## 2018-09-13 DIAGNOSIS — E1122 Type 2 diabetes mellitus with diabetic chronic kidney disease: Secondary | ICD-10-CM | POA: Diagnosis present

## 2018-09-13 DIAGNOSIS — R296 Repeated falls: Secondary | ICD-10-CM | POA: Diagnosis present

## 2018-09-13 DIAGNOSIS — I480 Paroxysmal atrial fibrillation: Secondary | ICD-10-CM | POA: Diagnosis present

## 2018-09-13 DIAGNOSIS — I959 Hypotension, unspecified: Secondary | ICD-10-CM

## 2018-09-13 DIAGNOSIS — I130 Hypertensive heart and chronic kidney disease with heart failure and stage 1 through stage 4 chronic kidney disease, or unspecified chronic kidney disease: Secondary | ICD-10-CM | POA: Diagnosis present

## 2018-09-13 DIAGNOSIS — E1151 Type 2 diabetes mellitus with diabetic peripheral angiopathy without gangrene: Secondary | ICD-10-CM | POA: Diagnosis present

## 2018-09-13 DIAGNOSIS — Z9981 Dependence on supplemental oxygen: Secondary | ICD-10-CM

## 2018-09-13 DIAGNOSIS — I672 Cerebral atherosclerosis: Secondary | ICD-10-CM | POA: Diagnosis present

## 2018-09-13 DIAGNOSIS — I361 Nonrheumatic tricuspid (valve) insufficiency: Secondary | ICD-10-CM | POA: Diagnosis not present

## 2018-09-13 DIAGNOSIS — Z22322 Carrier or suspected carrier of Methicillin resistant Staphylococcus aureus: Secondary | ICD-10-CM

## 2018-09-13 DIAGNOSIS — Z955 Presence of coronary angioplasty implant and graft: Secondary | ICD-10-CM

## 2018-09-13 DIAGNOSIS — G8929 Other chronic pain: Secondary | ICD-10-CM | POA: Diagnosis present

## 2018-09-13 DIAGNOSIS — F329 Major depressive disorder, single episode, unspecified: Secondary | ICD-10-CM | POA: Diagnosis present

## 2018-09-13 DIAGNOSIS — Z880 Allergy status to penicillin: Secondary | ICD-10-CM

## 2018-09-13 DIAGNOSIS — K219 Gastro-esophageal reflux disease without esophagitis: Secondary | ICD-10-CM | POA: Diagnosis present

## 2018-09-13 DIAGNOSIS — J9601 Acute respiratory failure with hypoxia: Secondary | ICD-10-CM

## 2018-09-13 DIAGNOSIS — G4733 Obstructive sleep apnea (adult) (pediatric): Secondary | ICD-10-CM | POA: Diagnosis present

## 2018-09-13 DIAGNOSIS — F5104 Psychophysiologic insomnia: Secondary | ICD-10-CM | POA: Diagnosis present

## 2018-09-13 DIAGNOSIS — Z6841 Body Mass Index (BMI) 40.0 and over, adult: Secondary | ICD-10-CM

## 2018-09-13 DIAGNOSIS — R5381 Other malaise: Secondary | ICD-10-CM | POA: Diagnosis present

## 2018-09-13 DIAGNOSIS — Z888 Allergy status to other drugs, medicaments and biological substances status: Secondary | ICD-10-CM

## 2018-09-13 DIAGNOSIS — Z8249 Family history of ischemic heart disease and other diseases of the circulatory system: Secondary | ICD-10-CM

## 2018-09-13 DIAGNOSIS — Z91048 Other nonmedicinal substance allergy status: Secondary | ICD-10-CM

## 2018-09-13 DIAGNOSIS — Z8349 Family history of other endocrine, nutritional and metabolic diseases: Secondary | ICD-10-CM

## 2018-09-13 DIAGNOSIS — Z881 Allergy status to other antibiotic agents status: Secondary | ICD-10-CM

## 2018-09-13 DIAGNOSIS — Z7951 Long term (current) use of inhaled steroids: Secondary | ICD-10-CM

## 2018-09-13 DIAGNOSIS — Z79811 Long term (current) use of aromatase inhibitors: Secondary | ICD-10-CM

## 2018-09-13 DIAGNOSIS — Z79891 Long term (current) use of opiate analgesic: Secondary | ICD-10-CM

## 2018-09-13 DIAGNOSIS — Z7901 Long term (current) use of anticoagulants: Secondary | ICD-10-CM

## 2018-09-13 DIAGNOSIS — Z85820 Personal history of malignant melanoma of skin: Secondary | ICD-10-CM

## 2018-09-13 DIAGNOSIS — R339 Retention of urine, unspecified: Secondary | ICD-10-CM | POA: Diagnosis present

## 2018-09-13 DIAGNOSIS — Z951 Presence of aortocoronary bypass graft: Secondary | ICD-10-CM

## 2018-09-13 LAB — BASIC METABOLIC PANEL
Anion gap: 13 (ref 5–15)
BUN: 29 mg/dL — ABNORMAL HIGH (ref 8–23)
CO2: 42 mmol/L — ABNORMAL HIGH (ref 22–32)
Calcium: 9.2 mg/dL (ref 8.9–10.3)
Chloride: 80 mmol/L — ABNORMAL LOW (ref 98–111)
Creatinine, Ser: 1.52 mg/dL — ABNORMAL HIGH (ref 0.44–1.00)
GFR calc Af Amer: 40 mL/min — ABNORMAL LOW (ref >60)
GFR calc non Af Amer: 34 mL/min — ABNORMAL LOW (ref >60)
Glucose, Bld: 204 mg/dL — ABNORMAL HIGH (ref 70–99)
Potassium: 3.6 mmol/L (ref 3.5–5.1)
Sodium: 135 mmol/L (ref 135–145)

## 2018-09-13 LAB — CBC
HCT: 33.9 % — ABNORMAL LOW (ref 36.0–46.0)
Hemoglobin: 10.9 g/dL — ABNORMAL LOW (ref 12.0–15.0)
MCH: 28.6 pg (ref 26.0–34.0)
MCHC: 32.2 g/dL (ref 30.0–36.0)
MCV: 89 fL (ref 80.0–100.0)
Platelets: 191 10*3/uL (ref 150–400)
RBC: 3.81 MIL/uL — ABNORMAL LOW (ref 3.87–5.11)
RDW: 14.7 % (ref 11.5–15.5)
WBC: 10.9 10*3/uL — ABNORMAL HIGH (ref 4.0–10.5)
nRBC: 0 % (ref 0.0–0.2)

## 2018-09-13 LAB — GLUCOSE, CAPILLARY
Glucose-Capillary: 178 mg/dL — ABNORMAL HIGH (ref 70–99)
Glucose-Capillary: 241 mg/dL — ABNORMAL HIGH (ref 70–99)
Glucose-Capillary: 214 mg/dL — ABNORMAL HIGH (ref 70–99)
Glucose-Capillary: 266 mg/dL — ABNORMAL HIGH (ref 70–99)

## 2018-09-13 MED ORDER — OXYCODONE HCL 5 MG PO TABS
5.0000 mg | ORAL_TABLET | Freq: Four times a day (QID) | ORAL | Status: DC | PRN
  Administered 2018-09-15: 5 mg via ORAL
  Filled 2018-09-13: qty 1

## 2018-09-13 MED ORDER — ORAL CARE MOUTH RINSE
15.0000 mL | Freq: Two times a day (BID) | OROMUCOSAL | Status: DC
  Administered 2018-09-13 – 2018-09-17 (×9): 15 mL via OROMUCOSAL

## 2018-09-13 MED ORDER — EZETIMIBE 10 MG PO TABS
10.0000 mg | ORAL_TABLET | Freq: Every day | ORAL | Status: DC
  Administered 2018-09-13 – 2018-09-17 (×5): 10 mg via ORAL
  Filled 2018-09-13 (×5): qty 1

## 2018-09-13 MED ORDER — ALPRAZOLAM 0.5 MG PO TABS
0.5000 mg | ORAL_TABLET | Freq: Two times a day (BID) | ORAL | Status: DC
  Administered 2018-09-13 – 2018-09-17 (×9): 0.5 mg via ORAL
  Filled 2018-09-13 (×9): qty 1

## 2018-09-13 MED ORDER — BUPROPION HCL ER (XL) 150 MG PO TB24
150.0000 mg | ORAL_TABLET | Freq: Every day | ORAL | Status: DC
  Administered 2018-09-14 – 2018-09-17 (×4): 150 mg via ORAL
  Filled 2018-09-13 (×5): qty 1

## 2018-09-13 MED ORDER — GABAPENTIN 300 MG PO CAPS
600.0000 mg | ORAL_CAPSULE | Freq: Two times a day (BID) | ORAL | Status: DC
  Administered 2018-09-13 – 2018-09-17 (×9): 600 mg via ORAL
  Filled 2018-09-13 (×9): qty 2

## 2018-09-13 MED ORDER — ZOLPIDEM TARTRATE 5 MG PO TABS
5.0000 mg | ORAL_TABLET | Freq: Every day | ORAL | Status: DC
  Administered 2018-09-13 – 2018-09-16 (×4): 5 mg via ORAL
  Filled 2018-09-13 (×5): qty 1

## 2018-09-13 MED ORDER — MOMETASONE FURO-FORMOTEROL FUM 200-5 MCG/ACT IN AERO
2.0000 | INHALATION_SPRAY | Freq: Two times a day (BID) | RESPIRATORY_TRACT | Status: DC
  Administered 2018-09-13 – 2018-09-17 (×9): 2 via RESPIRATORY_TRACT
  Filled 2018-09-13: qty 8.8

## 2018-09-13 MED ORDER — IPRATROPIUM-ALBUTEROL 0.5-2.5 (3) MG/3ML IN SOLN: 3.0000 mL | Freq: Two times a day (BID) | RESPIRATORY_TRACT | Status: DC | PRN

## 2018-09-13 MED ORDER — ACETAMINOPHEN 650 MG RE SUPP: 650.0000 mg | RECTAL | Status: DC | PRN

## 2018-09-13 MED ORDER — LIVING WELL WITH DIABETES BOOK
Freq: Once | Status: AC
  Administered 2018-09-14: 06:00:00
  Filled 2018-09-13: qty 1

## 2018-09-13 MED ORDER — BLOOD PRESSURE CONTROL BOOK
Freq: Once | Status: AC
  Administered 2018-09-14: 06:00:00
  Filled 2018-09-13: qty 1

## 2018-09-13 MED ORDER — AMLODIPINE BESYLATE 10 MG PO TABS
10.0000 mg | ORAL_TABLET | Freq: Every day | ORAL | Status: DC
  Administered 2018-09-14 – 2018-09-17 (×4): 10 mg via ORAL
  Filled 2018-09-13 (×4): qty 1

## 2018-09-13 MED ORDER — DM-GUAIFENESIN ER 30-600 MG PO TB12: 1.0000 | ORAL_TABLET | Freq: Two times a day (BID) | ORAL | Status: DC | PRN

## 2018-09-13 MED ORDER — DULOXETINE HCL 30 MG PO CPEP
30.0000 mg | ORAL_CAPSULE | Freq: Every day | ORAL | Status: DC
  Administered 2018-09-14 – 2018-09-17 (×4): 30 mg via ORAL
  Filled 2018-09-13 (×4): qty 1

## 2018-09-13 MED ORDER — NITROGLYCERIN 0.3 MG SL SUBL
0.3000 mg | SUBLINGUAL_TABLET | SUBLINGUAL | Status: DC | PRN
  Filled 2018-09-13 (×2): qty 100

## 2018-09-13 MED ORDER — SORBITOL 70 % SOLN
30.0000 mL | Freq: Every day | Status: DC | PRN
  Administered 2018-09-16: 30 mL via ORAL
  Filled 2018-09-13: qty 30

## 2018-09-13 MED ORDER — ACETAMINOPHEN 325 MG PO TABS
650.0000 mg | ORAL_TABLET | ORAL | Status: DC | PRN
  Administered 2018-09-16 (×2): 650 mg via ORAL
  Filled 2018-09-13 (×2): qty 2

## 2018-09-13 MED ORDER — RANOLAZINE ER 500 MG PO TB12
1000.0000 mg | ORAL_TABLET | Freq: Two times a day (BID) | ORAL | Status: DC
  Administered 2018-09-13 – 2018-09-17 (×9): 1000 mg via ORAL
  Filled 2018-09-13 (×10): qty 2

## 2018-09-13 MED ORDER — METOLAZONE 2.5 MG PO TABS
2.5000 mg | ORAL_TABLET | Freq: Every day | ORAL | Status: DC
  Administered 2018-09-14 – 2018-09-16 (×3): 2.5 mg via ORAL
  Filled 2018-09-13 (×3): qty 1

## 2018-09-13 MED ORDER — ACETAMINOPHEN 160 MG/5ML PO SOLN: 650.0000 mg | ORAL | Status: DC | PRN

## 2018-09-13 MED ORDER — VITAMIN B-12 1000 MCG PO TABS
1000.0000 ug | ORAL_TABLET | Freq: Every day | ORAL | Status: DC
  Administered 2018-09-14 – 2018-09-17 (×4): 1000 ug via ORAL
  Filled 2018-09-13 (×4): qty 1

## 2018-09-13 MED ORDER — LEVOFLOXACIN 500 MG PO TABS
500.0000 mg | ORAL_TABLET | ORAL | Status: AC
  Administered 2018-09-14 – 2018-09-16 (×3): 500 mg via ORAL
  Filled 2018-09-13 (×3): qty 1

## 2018-09-13 MED ORDER — PROSIGHT PO TABS
1.0000 | ORAL_TABLET | Freq: Every day | ORAL | Status: DC
  Administered 2018-09-14 – 2018-09-17 (×4): 1 via ORAL
  Filled 2018-09-13 (×4): qty 1

## 2018-09-13 MED ORDER — BISACODYL 5 MG PO TBEC
5.0000 mg | DELAYED_RELEASE_TABLET | Freq: Every day | ORAL | Status: DC | PRN
  Administered 2018-09-16 – 2018-09-17 (×2): 5 mg via ORAL
  Filled 2018-09-13 (×2): qty 1

## 2018-09-13 MED ORDER — INSULIN ASPART 100 UNIT/ML ~~LOC~~ SOLN
4.0000 [IU] | Freq: Three times a day (TID) | SUBCUTANEOUS | Status: DC
  Administered 2018-09-14 – 2018-09-17 (×12): 4 [IU] via SUBCUTANEOUS

## 2018-09-13 MED ORDER — INSULIN GLARGINE 100 UNIT/ML ~~LOC~~ SOLN
28.0000 [IU] | Freq: Every day | SUBCUTANEOUS | Status: DC
  Administered 2018-09-13: 28 [IU] via SUBCUTANEOUS
  Filled 2018-09-13 (×2): qty 0.28

## 2018-09-13 MED ORDER — LEVOFLOXACIN 500 MG PO TABS
500.0000 mg | ORAL_TABLET | ORAL | Status: DC
  Administered 2018-09-13: 500 mg via ORAL
  Filled 2018-09-13: qty 1

## 2018-09-13 MED ORDER — LIVING BETTER WITH HEART FAILURE BOOK: Freq: Once | Status: DC

## 2018-09-13 MED ORDER — ROSUVASTATIN CALCIUM 20 MG PO TABS
40.0000 mg | ORAL_TABLET | Freq: Every day | ORAL | Status: DC
  Administered 2018-09-13 – 2018-09-17 (×5): 40 mg via ORAL
  Filled 2018-09-13 (×5): qty 2

## 2018-09-13 MED ORDER — CLOPIDOGREL BISULFATE 75 MG PO TABS
75.0000 mg | ORAL_TABLET | Freq: Every day | ORAL | Status: DC
  Administered 2018-09-13 – 2018-09-17 (×5): 75 mg via ORAL
  Filled 2018-09-13 (×5): qty 1

## 2018-09-13 MED ORDER — LEVOFLOXACIN 500 MG PO TABS: 500.0000 mg | ORAL_TABLET | ORAL | Status: AC

## 2018-09-13 MED ORDER — INSULIN ASPART 100 UNIT/ML ~~LOC~~ SOLN
0.0000 [IU] | Freq: Three times a day (TID) | SUBCUTANEOUS | Status: DC
  Administered 2018-09-14: 5 [IU] via SUBCUTANEOUS
  Administered 2018-09-14: 3 [IU] via SUBCUTANEOUS
  Administered 2018-09-14: 5 [IU] via SUBCUTANEOUS
  Administered 2018-09-15: 2 [IU] via SUBCUTANEOUS
  Administered 2018-09-15: 5 [IU] via SUBCUTANEOUS
  Administered 2018-09-15 – 2018-09-16 (×2): 3 [IU] via SUBCUTANEOUS
  Administered 2018-09-16 (×2): 2 [IU] via SUBCUTANEOUS
  Administered 2018-09-17: 5 [IU] via SUBCUTANEOUS
  Administered 2018-09-17: 3 [IU] via SUBCUTANEOUS
  Administered 2018-09-17: 2 [IU] via SUBCUTANEOUS

## 2018-09-13 MED ORDER — ALBUTEROL SULFATE (2.5 MG/3ML) 0.083% IN NEBU
2.5000 mg | INHALATION_SOLUTION | RESPIRATORY_TRACT | Status: DC | PRN
  Filled 2018-09-13: qty 3

## 2018-09-13 MED ORDER — APIXABAN 5 MG PO TABS
5.0000 mg | ORAL_TABLET | Freq: Two times a day (BID) | ORAL | Status: DC
  Administered 2018-09-13 – 2018-09-17 (×9): 5 mg via ORAL
  Filled 2018-09-13 (×9): qty 1

## 2018-09-13 MED ORDER — PANTOPRAZOLE SODIUM 40 MG PO TBEC
40.0000 mg | DELAYED_RELEASE_TABLET | Freq: Every day | ORAL | Status: DC
  Administered 2018-09-14 – 2018-09-17 (×4): 40 mg via ORAL
  Filled 2018-09-13 (×4): qty 1

## 2018-09-13 MED ORDER — BUDESONIDE 0.25 MG/2ML IN SUSP
0.2500 mg | Freq: Two times a day (BID) | RESPIRATORY_TRACT | Status: DC
  Administered 2018-09-13 – 2018-09-17 (×7): 0.25 mg via RESPIRATORY_TRACT
  Filled 2018-09-13 (×10): qty 2

## 2018-09-13 MED ORDER — METOPROLOL SUCCINATE ER 50 MG PO TB24
50.0000 mg | ORAL_TABLET | Freq: Every day | ORAL | Status: DC
  Administered 2018-09-14 – 2018-09-17 (×4): 50 mg via ORAL
  Filled 2018-09-13 (×4): qty 1

## 2018-09-13 NOTE — Progress Notes (Signed)
Patient ID: [name] [name], female   DOB: 02-13-1945, 73 y.o.   [name]: [id number] Admit to unit, orented to rehab, thera0py schedule, plan of care and medication regimen. States an understanding of information reviewed. Margarito Liner

## 2018-09-13 NOTE — Progress Notes (Signed)
Inpatient Rehabilitation Admissions Coordinator  Inpt rehab  Bed is available to pt today. I contacted Dr. Tawanna Solo and he is in agreement to d/c pt to CIR today. I met with patient at bedside and contacted her spouse by phone. Both in agreement to admit today. I will make the arrangements. RN CM, Vida Roller, made aware.  Danne Baxter, RN, MSN Rehab Admissions Coordinator [phone] 09/13/2018 11:17 AM

## 2018-09-13 NOTE — PMR Pre-admission (Signed)
PMR Admission Coordinator Pre-Admission Assessment  Patient: [name] [name] is an 72 y.o., female [name]: [id number] DOB: [birth year] Height: 5' 2.5" (158.8 cm) Weight: 116.2 kg  Insurance Information HMO:     PPO:      PCP:      IPA:      80/20:      OTHER: no medicare advantage plan PRIMARY: Medicare a and b      Policy#: [id number]      Subscriber: pt Benefits:  Phone #: passport one online     Name: 09/13/2018 Eff. Date: 12/02/2012     Deduct: $1408      Out of Pocket Max: none      Life Max: none CIR: 100%      SNF: has been out of SNF since 07/21/2018 where she has used 99 days since 04/10/2018. Was out of SNF 07/21/2018 at home so 49 days outside of acute hospital and SNF Outpatient: 80%     Co-Pay: 20% Home Health: 100%      Co-Pay: none DME: 80%     Co-Pay: 20% Providers: pt choice  SECONDARY: A[employer]      Policy#: [id number]      Subscriber: pt  Medicaid Application Date:       Case Manager:  Disability Application Date:       Case Worker:   The "Data Collection Information Summary" for patients in Inpatient Rehabilitation Facilities with attached "Privacy Act [place] Records" was provided and verbally reviewed with: Patient  Emergency Contact Information Contact Information    Name Relation Home Work Mobile   [name] [name] Spouse [phone]  [phone]   [facility] Daughter   [phone]      Current Medical History  Patient Admitting Diagnosis: Debility  History of Present Illness: 73 year old right-handed female history of hypertension, chronic back pain maintained on oxycodone as needed type II diabetes mellitus, hyperlipidemia, asthma using oxygen as needed at night, chronic diastolic congestive heart failure with ejection fraction 25%, CVA, CAD with CABG, atrial fibrillation maintained on Eliquis as well as Plavix followed by Dr. Loralie Champagne, CKD stage III, OSA not on CPAP. Presented 09/09/2018 with transient slurred speech, dizziness, vomiting as well as  fall x3 without loss of consciousness.  Noted bruises to her left upper back and left leg.  WBC 14,500, creatinine 1.13, lactic acid 1.2, COVID negative, urinalysis negative, potassium 3.3, low-grade fever 100.9, blood pressure 177/71.  Chest x-ray showed cardiomegaly and vascular congestion with mild pulmonary edema as well as developing bronchopneumonia right lower lobe.  MRSA PCR screening positive.  X-rays of left femur and left rib negative for fracture.  CT of the head as well as MRI/MRA showed no acute ischemic infarct or intracranial hemorrhage.  Severe intracranial atherosclerosis noted.  Severe stenosis of left PCA P1 and P2 segments unchanged from compared to 06/01/2014.  Severe stenosis of both internal carotid arteries at the skull base progressed compared to 06/01/2014.  Neurology follow-up to address MRI suspect subacute to chronic right frontal lobe infarct new from prior scan of February 2020.  It was advised to continue Eliquis and Plavix as prior to admission.  Patient maintained on vancomycin and cefepime with fevers and leukocytosis improved.  She did receive low-dose diuresis monitoring for any signs of hypoxia.  Patient is tolerating a regular consistency diet.    Complete NIHSS TOTAL: 0  Patient's medical record from [facility] has been reviewed by the rehabilitation admission coordinator and physician.  Past Medical History  Past  Medical History:  Diagnosis Date  . Abnormality of gait 05/14/2014  . Anemia    hx  . Anginal pain ([place])   . Anxiety   . Asthma   . Basal cell carcinoma 05/2014   "left shoulder"  . Bundle branch block, left    chronic/notes 07/18/2013  . CHF (congestive heart failure) ([place])   . Chronic bronchitis ([place])    "off and on all the time" (07/18/2013)  . Chronic insomnia 05/06/2015  . Chronic kidney disease    frequency, sees dr Jamal Maes every 4 to 6 months (01/16/2018)  . Chronic low back pain 08/24/2016  . Chronic lower back pain   .  Claustrophobia   . Common migraine 05/14/2014  . Coronary artery disease   . Depression   . Dysrhythmia   . GERD (gastroesophageal reflux disease)   . H/O hiatal hernia   . Headache    "at least 2/month" (01/16/2018)  . Heart murmur   . Hyperlipidemia   . Hypertension   . Irregular heart beat   . Leg cramps    both legs at times  . Melanoma ([place]) 05/2014   "burned off BLE" (01/16/2018)  . Memory change 05/14/2014  . Migraine    "5-6/year"  (01/16/2018)  . Myocardial infarction ([place]) 04/1999, 02/2000, 01/2005; 2011; 2014  . Neuromuscular disorder ([place])    ?  . Obesity 01-2010  . Obstructive sleep apnea    "can't wear machine; I have claustrophobia" (01/16/2018), states she had a 2nd sleep study and does not have sleep apnea, her O2 decreases and now is on 2 L of O2 at night.  . On home oxygen therapy    "2L at night and prn during daytime" (01/16/2018)  . Osteoarthritis    "knees and hands" (01/16/2018)  . Other and unspecified angina pectoris   . Peripheral vascular disease (H[facility])    ? numbness, tingling arms and legs  . Pneumonia 2000's   "once"  . PONV (postoperative nausea and vomiting)   . Shortness of breath    with exertion  . Stroke ([place]) 03/22/12   right side brain; denies residual on 07/18/2013)  . Stroke [facility]) Oct. 2015   Affected pt.s balance  . Stroke [facility]) 10/2014   "affected my legs; fully recovered now"; still have sporatic memory issues from this one" (01/16/2018)  . Type II diabetes mellitus ([place])   . Ventral hernia    hx of  . Vomiting    persistent  . Vomiting blood     Family History   family history includes AAA (abdominal aortic aneurysm) in her father and mother; Cancer in her sister; Deep vein thrombosis in her father and mother; Diabetes in her father, paternal aunt, paternal grandmother, paternal uncle, and paternal uncle; Heart attack in her father; Heart attack (age of onset: 58) in her paternal grandfather; Heart disease in her father and  paternal uncle; Hyperlipidemia in her father; Hypertension in her father, mother, and sister.  Prior Rehab/Hospitalizations Has the patient had prior rehab or hospitalizations prior to admission? Yes   SNF at [facility] in [place] 04/09/29 until 05/16/2018. At home after d/c as she got out of car, fractured ankle. Returned to SNF until 07/21/2018 when she returned home with no weight bearing restrictions. She used 99 days of SNF benefit.  Has the patient had major surgery during 100 days prior to admission? No  115 days since ankle surgery in April 05/17/2018  Current Medications  Current Facility-Administered Medications:  .  0.9 %  sodium chloride infusion, , Intravenous, PRN, Debbe Odea, MD, Stopped at 09/12/18 0214 .  acetaminophen (TYLENOL) tablet 650 mg, 650 mg, Oral, Q4H PRN **OR** acetaminophen (TYLENOL) solution 650 mg, 650 mg, Per Tube, Q4H PRN **OR** acetaminophen (TYLENOL) suppository 650 mg, 650 mg, Rectal, Q4H PRN, Ivor Costa, MD .  albuterol (PROVENTIL) (2.5 MG/3ML) 0.083% nebulizer solution 2.5 mg, 2.5 mg, Nebulization, Q4H PRN, Ivor Costa, MD .  ALPRAZolam Duanne Moron) tablet 0.5 mg, 0.5 mg, Oral, BID, Ivor Costa, MD, 0.5 mg at 09/13/18 0947 .  amLODipine (NORVASC) tablet 10 mg, 10 mg, Oral, Daily, Rizwan, Saima, MD, 10 mg at 09/13/18 0947 .  apixaban (ELIQUIS) tablet 5 mg, 5 mg, Oral, BID, Aroor, Lanice Schwab, MD, 5 mg at 09/13/18 0947 .  bisacodyl (DULCOLAX) EC tablet 5 mg, 5 mg, Oral, Daily PRN, Ivor Costa, MD .  budesonide (PULMICORT) nebulizer solution 0.25 mg, 0.25 mg, Nebulization, BID, Ivor Costa, MD, 0.25 mg at 09/13/18 0835 .  buPROPion (WELLBUTRIN XL) 24 hr tablet 150 mg, 150 mg, Oral, Daily, Ivor Costa, MD, 150 mg at 09/13/18 0947 .  Chlorhexidine Gluconate Cloth 2 % PADS 6 each, 6 each, Topical, Q0600, Debbe Odea, MD, 6 each at 09/13/18 [phone]  clopidogrel (PLAVIX) tablet 75 mg, 75 mg, Oral, QHS, Ivor Costa, MD, 75 mg at 09/12/18 2207 .  dextromethorphan-guaiFENesin  (MUCINEX DM) 30-600 MG per 12 hr tablet 1 tablet, 1 tablet, Oral, BID PRN, Ivor Costa, MD .  DULoxetine (CYMBALTA) DR capsule 30 mg, 30 mg, Oral, Daily, Ivor Costa, MD, 30 mg at 09/13/18 0947 .  ezetimibe (ZETIA) tablet 10 mg, 10 mg, Oral, QHS, Ivor Costa, MD, 10 mg at 09/12/18 2207 .  gabapentin (NEURONTIN) capsule 600 mg, 600 mg, Oral, BID, Ivor Costa, MD, 600 mg at 09/13/18 0948 .  hydrALAZINE (APRESOLINE) injection 5 mg, 5 mg, Intravenous, Q2H PRN, Ivor Costa, MD, 5 mg at 09/10/18 0757 .  insulin aspart (novoLOG) injection 0-9 Units, 0-9 Units, Subcutaneous, TID WC, Ivor Costa, MD, 2 Units at 09/13/18 [phone] .  insulin aspart (novoLOG) injection 4 Units, 4 Units, Subcutaneous, TID WC, Debbe Odea, MD, 4 Units at 09/13/18 [phone] .  insulin glargine (LANTUS) injection 28 Units, 28 Units, Subcutaneous, QHS, Debbe Odea, MD, 28 Units at 09/12/18 2221 .  ipratropium-albuterol (DUONEB) 0.5-2.5 (3) MG/3ML nebulizer solution 3 mL, 3 mL, Nebulization, BID PRN, Ivor Costa, MD .  levofloxacin (LEVAQUIN) tablet 500 mg, 500 mg, Oral, Q24H, Adhikari, Amrit, MD .  metoCLOPramide (REGLAN) injection 10 mg, 10 mg, Intravenous, Q12H PRN, Aroor, Karena Addison R, MD .  metolazone (ZAROXOLYN) tablet 2.5 mg, 2.5 mg, Oral, Daily, Ivor Costa, MD, 2.5 mg at 09/13/18 0947 .  metoprolol succinate (TOPROL-XL) 24 hr tablet 50 mg, 50 mg, Oral, Daily, Rizwan, Saima, MD, 50 mg at 09/13/18 0948 .  mometasone-formoterol (DULERA) 200-5 MCG/ACT inhaler 2 puff, 2 puff, Inhalation, BID, Ivor Costa, MD, 2 puff at 09/13/18 [phone] .  multivitamin (PROSIGHT) tablet 1 tablet, 1 tablet, Oral, Daily, Ivor Costa, MD, 1 tablet at 09/13/18 0948 .  mupirocin ointment (BACTROBAN) 2 % 1 application, 1 application, Nasal, BID, Debbe Odea, MD, 1 application at 30/16/01 0948 .  nitroGLYCERIN (NITROSTAT) SL tablet 0.3 mg, 0.3 mg, Sublingual, Q5 Min x 3 PRN, Ivor Costa, MD .  ondansetron [facility]) injection 4 mg, 4 mg, Intravenous, Q8H PRN, Ivor Costa,  MD .  oxyCODONE (Oxy IR/ROXICODONE) immediate release tablet 5 mg, 5 mg, Oral, Q6H PRN, Ivor Costa, MD .  pantoprazole ([place])  EC tablet 40 mg, 40 mg, Oral, Daily, Ivor Costa, MD, 40 mg at 09/13/18 0947 .  ranolazine (RANEXA) 12 hr tablet 1,000 mg, 1,000 mg, Oral, Q12H, Ivor Costa, MD, 1,000 mg at 09/13/18 0947 .  rosuvastatin (CRESTOR) tablet 40 mg, 40 mg, Oral, QHS, Ivor Costa, MD, 40 mg at 09/12/18 2205 .  sodium chloride flush (NS) 0.9 % injection 3 mL, 3 mL, Intravenous, Once, Ivor Costa, MD .  triamcinolone cream (KENALOG) 0.1 % 1 application, 1 application, Topical, Daily, Ivor Costa, MD, 1 application at 38/25/05 [phone] .  vitamin B-12 (CYANOCOBALAMIN) tablet 1,000 mcg, 1,000 mcg, Oral, Daily, Ivor Costa, MD, 1,000 mcg at 09/13/18 0947 .  zolpidem (AMBIEN) tablet 5 mg, 5 mg, Oral, QHS, Ivor Costa, MD, 5 mg at 09/12/18 2207  Patients Current Diet:  Diet Order            Diet - low sodium heart healthy        Diet heart healthy/carb modified Room service appropriate? Yes; Fluid consistency: Thin; Fluid restriction: 1200 mL Fluid  Diet effective now              Precautions / Restrictions Precautions Precautions: Fall Precaution Comments: hx of falls at home (~8 in last 6 months) Restrictions Weight Bearing Restrictions: No   Has the patient had 2 or more falls or a fall with injury in the past year? Yes Patient reports 4 falls [facility]. She has issues with low heart rate and BP at times.  Prior Activity Level Limited Community (1-2x/wk): decline in function since February hospitalization  Prior Functional Level Self Care: Did the patient need help bathing, dressing, using the toilet or eating? Needed some help  Indoor Mobility: Did the patient need assistance with walking from room to room (with or without device)? Independent  Stairs: Did the patient need assistance with internal or external stairs (with or without device)? Needed some  help  Functional Cognition: Did the patient need help planning regular tasks such as shopping or remembering to take medications? Independent  Home Equities trader / Equipment Home Assistive Devices/Equipment: Wheelchair, Shower chair without back, CBG Meter, Environmental consultant (specify type) Home Equipment: Walker - 4 wheels, Shower seat  Prior Device Use: Indicate devices/aids used by the patient prior to current illness, exacerbation or injury? Walker  Current Functional Level Cognition  Arousal/Alertness: Awake/alert Overall Cognitive Status: Within Functional Limits for tasks assessed Orientation Level: Oriented X4 Memory: Impaired Memory Impairment: Retrieval deficit, Decreased short term memory, Other (comment)(baseline deficits) Awareness: Appears intact Problem Solving: Appears intact Safety/Judgment: Appears intact    Extremity Assessment (includes Sensation/Coordination)  Upper Extremity Assessment: Generalized weakness  Lower Extremity Assessment: Defer to PT evaluation    ADLs  Overall ADL's : Needs assistance/impaired Eating/Feeding: Independent, Sitting Grooming: Set up, Sitting Upper Body Bathing: Minimal assistance, Sitting Lower Body Bathing: Minimal assistance, Sit to/from stand Upper Body Dressing : Minimal assistance, Sitting Lower Body Dressing: Minimal assistance, Sit to/from stand Toilet Transfer: Minimal assistance, Ambulation, RW Functional mobility during ADLs: Minimal assistance    Mobility  Overal bed mobility: Needs Assistance Bed Mobility: Supine to Sit Supine to sit: Min assist Sit to supine: Mod assist General bed mobility comments: use of rail and increased time and effort needed; assist to bring hips to EOB with use of bed pad    Transfers  Overall transfer level: Needs assistance Equipment used: Rolling walker (2 wheeled) Transfers: Sit to/from Stand Sit to Stand: Min guard Stand pivot transfers: Mod  assist General transfer comment: min  guard for safety from EOB and recliner; cues for safe hand placement    Ambulation / Gait / Stairs / Wheelchair Mobility  Ambulation/Gait Ambulation/Gait assistance: Min assist, Mod assist Gait Distance (Feet): (60 ft then ~6 ft) Assistive device: Rolling walker (2 wheeled) Gait Pattern/deviations: Step-through pattern, Step-to pattern, Decreased stride length General Gait Details: pt with decreased cadence and stride length; initial gait bout pt demonstrates R LE weakness and near buckling and after seate rest break pt agreeable to attempt further ambulation however bilat LE weakness was significant and tolerated only ~6 ft; chair follow for safety  Gait velocity: decreased    Posture / Balance Balance Overall balance assessment: Needs assistance, History of Falls Sitting-balance support: Feet supported, No upper extremity supported Sitting balance-Leahy Scale: Fair Standing balance support: During functional activity Standing balance-Leahy Scale: Poor Standing balance comment: Reliant on BUEs and external support for standing balance.    Special needs/care consideration BiPAP/CPAP OSA but no CPAP CPM  N/a Continuous Drip IV  N/a Dialysis n/a Life Vest n/a Oxygen  O2 at 2 liters [place] at HS only pta; 09/13/2018 using 1 to 2 liters [place] on acute Special Bed  N/a Trach Size  N/a Wound Vac n/a Skin rash left arm; ecchymosis to bilateral UE; ecchymosis to left buttocks region Bowel mgmt:  continent LBM 8/11 Bladder mgmt: external catheter; uses incontinence pad at home due to infrequent incontinence likely related to diuretic use Diabetic mgmt: n/a Behavioral consideration  N/a Chemo/radiation  N/a Spouse, [name] is Media planner   Previous Home Environment  Living Arrangements: Spouse/significant other  Lives With: Spouse Available Help at Discharge: (spouse and daughter provide 24/7 supervision) Type of Home: House Home Layout: Two level, 1/2 bath on main level, Bed/bath  upstairs Alternate Level Stairs-Rails: Can reach both Alternate Level Stairs-Number of Steps: has a stair lift Home Access: Level entry Bathroom Shower/Tub: Multimedia programmer: Standard Bathroom Accessibility: Yes How Accessible: Accessible via walker Home Care Services: Yes Type of Home Care Services: Home RN, [place] (if known): [facility]  Discharge Living Setting Plans for Discharge Living Setting: Patient's home, Lives with (comment)(spouse) Type of Home at Discharge: House Discharge Home Layout: Two level, 1/2 bath on main level, Bed/bath upstairs Alternate Level Stairs-Rails: Right, Left, Can reach both(Has chair lift) Alternate Level Stairs-Number of Steps: flight/has chair lift Discharge Home Access: Level entry Discharge Bathroom Shower/Tub: Walk-in shower Discharge Bathroom Toilet: Standard Discharge Bathroom Accessibility: Yes How Accessible: Accessible via walker Does the patient have any problems obtaining your medications?: No  Social/Family/Support Systems Patient Roles: Spouse, Parent Contact Information: spouse , [name] Anticipated Caregiver: [name] and daughter, Danton Clap Anticipated Caregiver's Contact Information: cell [phone] Ability/Limitations of Caregiver: spouse has own buisiness, lift for vehicles and dtr is RN with [employer]; they arrange 24/7 assist between them Caregiver Availability: 24/7 Discharge Plan Discussed with Primary Caregiver: Yes Is Caregiver In Agreement with Plan?: Yes Does Caregiver/Family have Issues with Lodging/Transportation while Pt is in Rehab?: No   Daughter, Danton Clap is [facility] RN with [employer]. Does not live with her parents but assists with 24/7 assist when spouse working. 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  Goals/Additional Needs Patient/Family Goal for Rehab: supervision PT, supervision to [facility] OT Expected length of stay: ELOS 10 to 12 days Special Service Needs: Spouse, [name] is visitor  designee Pt/Family Agrees to Admission and willing to participate: Yes Program Orientation Provided & Reviewed with Pt/Caregiver Including Roles  & Responsibilities:  Yes  Decrease burden of Care through IP rehab admission: n/a  Possible need for SNF placement upon discharge: not anticipated. Has used 99 days of 100 days SNF so far this year.  Patient Condition: I have reviewed medical records from [facility] , spoken with CM, and patient and spouse. I met with patient at the bedside for inpatient rehabilitation assessment.  Patient will benefit from ongoing PT and OT, can actively participate in 3 hours of therapy a day 5 days of the week, and can make measurable gains during the admission.  Patient will also benefit from the coordinated team approach during an Inpatient Acute Rehabilitation admission.  The patient will receive intensive therapy as well as Rehabilitation physician, nursing, social worker, and care management interventions.  Due to bladder management, bowel management, safety, skin/wound care, disease management, medication administration, pain management and patient education the patient requires 24 hour a day rehabilitation nursing.  The patient is currently min to mod assist with mobility and basic ADLs.  Discharge setting and therapy post discharge at home with home health is anticipated.  Patient has agreed to participate in the Acute Inpatient Rehabilitation Program and will admit today.  Preadmission Screen Completed By:  Cleatrice Burke RN MSN 09/13/2018 11:23 AM ______________________________________________________________________   Discussed status with Dr. Dagoberto Ligas on 09/13/2018  at  1140 and received approval for admission today.  Admission Coordinator:  Cleatrice Burke, RN, time  1140 Date  09/13/2018   Assessment/Plan: Diagnosis: 1. Does the need for close, 24 hr/day Medical supervision in concert with the patient's rehab needs make it unreasonable  for this patient to be served in a less intensive setting? Yes 2. Co-Morbidities requiring supervision/potential complications: CHF with EF of 25%, recent CVA with new subacute CVA, admission for pneumonia, and recent ankle fx 3. Due to bladder management, safety, skin/wound care and medication administration, does the patient require 24 hr/day rehab nursing? Yes 4. Does the patient require coordinated care of a physician, rehab nurse, PT (1-2 hrs hrs/day, 5 days/week), OT (1-2 hrs hrs/day, 5 days/week) and SLP (1 hr hrs/day, 5 days/week) to address physical and functional deficits in the context of the above medical diagnosis(es)? Yes Addressing deficits in the following areas: balance, endurance, locomotion, strength, transferring, bathing, dressing, toileting, language and psychosocial support 5. Can the patient actively participate in an intensive therapy program of at least 3 hrs of therapy 5 days a week? Yes 6. The potential for patient to make measurable gains while on inpatient rehab is fair 7. Anticipated functional outcomes upon discharge from inpatients are: supervision PT, supervision OT, n/a SLP 8. Estimated rehab length of stay to reach the above functional goals is: 1-12 days 9. Anticipated D/C setting: Home with husband/daughter 10. Anticipated post D/C treatments: [place] therapy 11. Overall Rehab/Functional Prognosis: fair  MD Signature:

## 2018-09-13 NOTE — Progress Notes (Signed)
OT Cancellation Note  Patient Details Name: [name] [name] [name]: [id number] DOB: 1945/07/11   Cancelled Treatment:    Reason Eval/Treat Not Completed: Other (comment)(Pt d/c to CIR very soon. OT unable to see pt at this time.)   Darryl Nestle) Marsa Aris OTR/L Acute Rehabilitation Services Pager: [phone] Office: [place] 09/13/2018, 4:43 PM

## 2018-09-13 NOTE — Progress Notes (Deleted)
[name], MD    [name], MD  Physician  Physical Medicine and Rehabilitation     PMR Pre-admission  Signed     Date of Service:  09/13/2018 11:23 AM        Related encounter: ED to Hosp-Admission (Current) from 09/09/2018 in [facility]             Signed                    Expand widget buttonCollapse widget button    Show:Clear all   ManualTemplateCopied  Added by:     Cristina Gong, RN  [name], MD   Hover for detailscustomization button                                                                                                                                                                  PMR Admission Coordinator Pre-Admission Assessment     Patient: [name] [name] is an 73 y.o., female  [name]: [id number]  DOB: 1945/05/09  Height: 5' 2.5" (158.8 cm)  Weight: 116.2 kg     Insurance Information  HMO:     PPO:      PCP:      IPA:      80/20:      OTHER: no medicare advantage plan  PRIMARY: Medicare a and b      Policy#: [id number]      Subscriber: pt  Benefits:  Phone #: passport one online     Name: 09/13/2018  Eff. Date: 12/02/2012     Deduct: $1408      Out of Pocket Max: none      Life Max: none  CIR: 100%      SNF: has been out of SNF since 07/21/2018 where she has used 99 days since 04/10/2018. Was out of SNF 07/21/2018 at home so 49 days outside of acute hospital and SNF  Outpatient: 80%     Co-Pay: 20%  Home Health: 100%      Co-Pay: none  DME: 80%     Co-Pay: 20%  Providers: pt choice   SECONDARY: AARP      Policy#: [id number]      Subscriber: pt     Medicaid Application Date:       Case Manager:   Disability Application Date:       Case Worker:      The "Data Collection  Information Summary" for patients in Inpatient Rehabilitation Facilities with attached "Privacy Act [place] Records" was provided and verbally reviewed with: Patient     Emergency Contact Information  Contact Information            Name    Relation    Home    Work    Mobile         [name] [name]   Spouse   [phone]       [phone]        [facility]   Daughter           [phone]                Current Medical History   Patient Admitting Diagnosis: Debility     History of Present Illness: 73 year old right-handed female history of hypertension, chronic back pain maintained on oxycodone as needed type II diabetes mellitus, hyperlipidemia, asthma using oxygen as needed at night, chronic diastolic congestive heart failure with ejection fraction 25%, CVA, CAD with CABG, atrial fibrillation maintained on Eliquis as well as Plavix followed by Dr. Loralie Champagne, CKD stage III, OSA not on CPAP. Presented 09/09/2018 with transient slurred speech, dizziness, vomiting as well as fall x3 without loss of consciousness.  Noted bruises to her left upper back and left leg.  WBC 14,500, creatinine 1.13, lactic acid 1.2, COVID negative, urinalysis negative, potassium 3.3, low-grade fever 100.9, blood pressure 177/71.  Chest x-ray showed cardiomegaly and vascular congestion with mild pulmonary edema as well as developing bronchopneumonia right lower lobe.  MRSA PCR screening positive.  X-rays of left femur and left rib negative for fracture.  CT of the head as well as MRI/MRA showed no acute ischemic infarct or intracranial hemorrhage.  Severe intracranial atherosclerosis noted.  Severe stenosis of left PCA P1 and P2 segments unchanged from compared to 06/01/2014.  Severe stenosis of both internal carotid arteries at the skull base progressed compared to 06/01/2014.  Neurology follow-up to address MRI suspect subacute to chronic right  frontal lobe infarct new from prior scan of February 2020.  It was advised to continue Eliquis and Plavix as prior to admission.  Patient maintained on vancomycin and cefepime with fevers and leukocytosis improved.  She did receive low-dose diuresis monitoring for any signs of hypoxia.  Patient is tolerating a regular consistency diet.       Complete NIHSS TOTAL: 0     Patient's medical record from [facility] has been reviewed by the rehabilitation admission coordinator and physician.     Past Medical History        Past Medical History:    Diagnosis   Date    .   Abnormality of gait   05/14/2014    .   Anemia            hx    .   Anginal pain ([place])        .   Anxiety        .   Asthma        .   Basal cell carcinoma   05/2014        "left shoulder"    .   Bundle branch block, left            chronic/notes 07/18/2013    .   CHF (congestive heart failure) ([place])        .   Chronic bronchitis ([place])            "off and on all the time" (07/18/2013)    .   Chronic insomnia   05/06/2015    .   Chronic kidney disease  frequency, sees dr Jamal Maes every 4 to 6 months (01/16/2018)    .   Chronic low back pain   08/24/2016    .   Chronic lower back pain        .   Claustrophobia        .   Common migraine   05/14/2014    .   Coronary artery disease        .   Depression        .   Dysrhythmia        .   GERD (gastroesophageal reflux disease)        .   H/O hiatal hernia        .   Headache            "at least 2/month" (01/16/2018)    .   Heart murmur        .   Hyperlipidemia        .   Hypertension        .   Irregular heart beat        .   Leg cramps            both legs at times    .   Melanoma ([place])   05/2014        "burned off BLE" (01/16/2018)    .    Memory change   05/14/2014    .   Migraine            "5-6/year"  (01/16/2018)    .   Myocardial infarction ([place])   04/1999, 02/2000, 01/2005; 2011; 2014    .   Neuromuscular disorder ([place])            ?    .   Obesity   01-2010    .   Obstructive sleep apnea            "can't wear machine; I have claustrophobia" (01/16/2018), states she had a 2nd sleep study and does not have sleep apnea, her O2 decreases and now is on 2 L of O2 at night.    .   On home oxygen therapy            "2L at night and prn during daytime" (01/16/2018)    .   Osteoarthritis            "knees and hands" (01/16/2018)    .   Other and unspecified angina pectoris        .   Peripheral vascular disease (HCC)            ? numbness, tingling arms and legs    .   Pneumonia   2000's        "once"    .   PONV (postoperative nausea and vomiting)        .   Shortness of breath            with exertion    .   Stroke ([place])   03/22/12        right side brain; denies residual on 07/18/2013)    .   Stroke [facility])   Oct. 2015        Affected pt.s balance    .   Stroke [facility])   10/2014        "affected my legs; fully recovered now"; still have sporatic memory issues from this one" (01/16/2018)    .  Type II diabetes mellitus ([place])        .   Ventral hernia            hx of    .   Vomiting            persistent    .   Vomiting blood             Family History    family history includes AAA (abdominal aortic aneurysm) in her father and mother; Cancer in her sister; Deep vein thrombosis in her father and mother; Diabetes in her father, paternal aunt, paternal grandmother, paternal uncle, and paternal uncle; Heart attack in her father; Heart attack (age of onset: 5) in her paternal grandfather; Heart disease in her father and paternal uncle; Hyperlipidemia in  her father; Hypertension in her father, mother, and sister.     Prior Rehab/Hospitalizations  Has the patient had prior rehab or hospitalizations prior to admission? Yes      SNF at [facility] in [place] 04/09/29 until 05/16/2018. At home after d/c as she got out of [name], fractured ankle. Returned to SNF until 07/21/2018 when she returned home with no weight bearing restrictions. She used 99 days of SNF benefit.     Has the patient had major surgery during 100 days prior to admission? No  115 days since ankle surgery in April 05/17/2018     Current Medications    Current Facility-Administered Medications:   .  0.9 %  sodium chloride infusion, , Intravenous, PRN, Debbe Odea, MD, Stopped at 09/12/18 0214  .  acetaminophen (TYLENOL) tablet 650 mg, 650 mg, Oral, Q4H PRN **OR** acetaminophen (TYLENOL) solution 650 mg, 650 mg, Per Tube, Q4H PRN **OR** acetaminophen (TYLENOL) suppository 650 mg, 650 mg, Rectal, Q4H PRN, Ivor Costa, MD  .  albuterol (PROVENTIL) (2.5 MG/3ML) 0.083% nebulizer solution 2.5 mg, 2.5 mg, Nebulization, Q4H PRN, Ivor Costa, MD  .  ALPRAZolam Duanne Moron) tablet 0.5 mg, 0.5 mg, Oral, BID, Ivor Costa, MD, 0.5 mg at 09/13/18 0947  .  amLODipine (NORVASC) tablet 10 mg, 10 mg, Oral, Daily, Rizwan, Saima, MD, 10 mg at 09/13/18 0947  .  apixaban (ELIQUIS) tablet 5 mg, 5 mg, Oral, BID, Aroor, Lanice Schwab, MD, 5 mg at 09/13/18 0947  .  bisacodyl (DULCOLAX) EC tablet 5 mg, 5 mg, Oral, Daily PRN, Ivor Costa, MD  .  budesonide (PULMICORT) nebulizer solution 0.25 mg, 0.25 mg, Nebulization, BID, Ivor Costa, MD, 0.25 mg at 09/13/18 0835  .  buPROPion (WELLBUTRIN XL) 24 hr tablet 150 mg, 150 mg, Oral, Daily, Ivor Costa, MD, 150 mg at 09/13/18 0947  .  Chlorhexidine Gluconate Cloth 2 % PADS 6 each, 6 each, Topical, Q0600, Debbe Odea, MD, 6 each at 09/13/18 [phone]  .  clopidogrel (PLAVIX) tablet 75 mg, 75 mg, Oral, QHS, Ivor Costa, MD, 75 mg at 09/12/18 2207  .   dextromethorphan-guaiFENesin (MUCINEX DM) 30-600 MG per 12 hr tablet 1 tablet, 1 tablet, Oral, BID PRN, Ivor Costa, MD  .  DULoxetine (CYMBALTA) DR capsule 30 mg, 30 mg, Oral, Daily, Ivor Costa, MD, 30 mg at 09/13/18 0947  .  ezetimibe (ZETIA) tablet 10 mg, 10 mg, Oral, QHS, Ivor Costa, MD, 10 mg at 09/12/18 2207  .  gabapentin (NEURONTIN) capsule 600 mg, 600 mg, Oral, BID, Ivor Costa, MD, 600 mg at 09/13/18 0948  .  hydrALAZINE (APRESOLINE) injection 5 mg, 5 mg, Intravenous, Q2H PRN, Ivor Costa, MD, 5 mg at 09/10/18 0757  .  insulin aspart (novoLOG) injection 0-9 Units, 0-9 Units, Subcutaneous, TID WC, Ivor Costa, MD, 2 Units at 09/13/18 [phone]  .  insulin aspart (novoLOG) injection 4 Units, 4 Units, Subcutaneous, TID WC, Debbe Odea, MD, 4 Units at 09/13/18 [phone]  .  insulin glargine (LANTUS) injection 28 Units, 28 Units, Subcutaneous, QHS, Debbe Odea, MD, 28 Units at 09/12/18 2221  .  ipratropium-albuterol (DUONEB) 0.5-2.5 (3) MG/3ML nebulizer solution 3 mL, 3 mL, Nebulization, BID PRN, Ivor Costa, MD  .  levofloxacin (LEVAQUIN) tablet 500 mg, 500 mg, Oral, Q24H, Adhikari, Amrit, MD  .  metoCLOPramide (REGLAN) injection 10 mg, 10 mg, Intravenous, Q12H PRN, Aroor, Karena Addison R, MD  .  metolazone (ZAROXOLYN) tablet 2.5 mg, 2.5 mg, Oral, Daily, Ivor Costa, MD, 2.5 mg at 09/13/18 0947  .  metoprolol succinate (TOPROL-XL) 24 hr tablet 50 mg, 50 mg, Oral, Daily, Rizwan, Saima, MD, 50 mg at 09/13/18 0948  .  mometasone-formoterol (DULERA) 200-5 MCG/ACT inhaler 2 puff, 2 puff, Inhalation, BID, Ivor Costa, MD, 2 puff at 09/13/18 [phone]  .  multivitamin (PROSIGHT) tablet 1 tablet, 1 tablet, Oral, Daily, Ivor Costa, MD, 1 tablet at 09/13/18 0948  .  mupirocin ointment (BACTROBAN) 2 % 1 application, 1 application, Nasal, BID, Debbe Odea, MD, 1 application at 62/95/28 0948  .  nitroGLYCERIN (NITROSTAT) SL tablet 0.3 mg, 0.3 mg, Sublingual, Q5 Min x 3 PRN, Ivor Costa, MD  .  ondansetron  [facility]) injection 4 mg, 4 mg, Intravenous, Q8H PRN, Ivor Costa, MD  .  oxyCODONE (Oxy IR/ROXICODONE) immediate release tablet 5 mg, 5 mg, Oral, Q6H PRN, Ivor Costa, MD  .  pantoprazole (PROTONIX) EC tablet 40 mg, 40 mg, Oral, Daily, Ivor Costa, MD, 40 mg at 09/13/18 0947  .  ranolazine (RANEXA) 12 hr tablet 1,000 mg, 1,000 mg, Oral, Q12H, Ivor Costa, MD, 1,000 mg at 09/13/18 0947  .  rosuvastatin (CRESTOR) tablet 40 mg, 40 mg, Oral, QHS, Ivor Costa, MD, 40 mg at 09/12/18 2205  .  sodium chloride flush (NS) 0.9 % injection 3 mL, 3 mL, Intravenous, Once, Ivor Costa, MD  .  triamcinolone cream (KENALOG) 0.1 % 1 application, 1 application, Topical, Daily, Ivor Costa, MD, 1 application at 41/32/44 [phone]  .  vitamin B-12 (CYANOCOBALAMIN) tablet 1,000 mcg, 1,000 mcg, Oral, Daily, Ivor Costa, MD, 1,000 mcg at 09/13/18 0947  .  zolpidem (AMBIEN) tablet 5 mg, 5 mg, Oral, QHS, Ivor Costa, MD, 5 mg at 09/12/18 2207     Patients Current Diet:       Diet Order                                       Diet - low sodium heart healthy                  Diet heart healthy/carb modified Room service appropriate? Yes; Fluid consistency: Thin; Fluid restriction: 1200 mL Fluid  Diet effective now                              Precautions / Restrictions  Precautions  Precautions: Fall  Precaution Comments: hx of falls at home (~8 in last 6 months)  Restrictions  Weight Bearing Restrictions: No      Has the patient had 2 or more falls or a fall with injury in the past year? Yes Patient reports  4 falls since d/c from [facility] 07/21/2018. She has issues with low heart rate and BP at times.     Prior Activity Level  Limited Community (1-2x/wk): decline in function since February hospitalization     Prior Functional Level  Self Care: Did the patient need help bathing, dressing, using the toilet or eating? Needed some help     Indoor Mobility: Did the  patient need assistance with walking from room to room (with or without device)? Independent     Stairs: Did the patient need assistance with internal or external stairs (with or without device)? Needed some help     Functional Cognition: Did the patient need help planning regular tasks such as shopping or remembering to take medications? Independent     Home Equities trader / Equipment  Home Assistive Devices/Equipment: Wheelchair, Shower chair without back, CBG Meter, Environmental consultant (specify type)  Home Equipment: Walker - 4 wheels, Shower seat     Prior Device Use: Indicate devices/aids used by the patient prior to current illness, exacerbation or injury? Walker     Current Functional Level   Cognition      Arousal/Alertness: Awake/alert  Overall Cognitive Status: Within Functional Limits for tasks assessed  Orientation Level: Oriented X4  Memory: Impaired  Memory Impairment: Retrieval deficit, Decreased short term memory, Other (comment)(baseline deficits)  Awareness: Appears intact  Problem Solving: Appears intact  Safety/Judgment: Appears intact       Extremity Assessment  (includes Sensation/Coordination)      Upper Extremity Assessment: Generalized weakness   Lower Extremity Assessment: Defer to PT evaluation        ADLs      Overall ADL's : Needs assistance/impaired  Eating/Feeding: Independent, Sitting  Grooming: Set up, Sitting  Upper Body Bathing: Minimal assistance, Sitting  Lower Body Bathing: Minimal assistance, Sit to/from stand  Upper Body Dressing : Minimal assistance, Sitting  Lower Body Dressing: Minimal assistance, Sit to/from stand  Toilet Transfer: Minimal assistance, Ambulation, RW  Functional mobility during ADLs: Minimal assistance        Mobility      Overal bed mobility: Needs Assistance  Bed Mobility: Supine to Sit  Supine to sit: Min assist  Sit to supine: Mod assist  General bed mobility  comments: use of rail and increased time and effort needed; assist to bring hips to EOB with use of bed pad        Transfers      Overall transfer level: Needs assistance  Equipment used: Rolling walker (2 wheeled)  Transfers: Sit to/from Stand  Sit to Stand: Min guard  Stand pivot transfers: Mod assist  General transfer comment: min guard for safety from EOB and recliner; cues for safe hand placement        Ambulation / Gait / Stairs / Wheelchair Mobility      Ambulation/Gait  Ambulation/Gait assistance: Min assist, Mod assist  Gait Distance (Feet): (60 ft then ~6 ft)  Assistive device: Rolling walker (2 wheeled)  Gait Pattern/deviations: Step-through pattern, Step-to pattern, Decreased stride length  General Gait Details: pt with decreased cadence and stride length; initial gait bout pt demonstrates R LE weakness and near buckling and after seate rest break pt agreeable to attempt further ambulation however bilat LE weakness was significant and tolerated only ~6 ft; chair follow for safety   Gait velocity: decreased        Posture / Balance   Balance  Overall balance assessment: Needs assistance, History of Falls  Sitting-balance support: Feet supported,  No upper extremity supported  Sitting balance-Leahy Scale: Fair  Standing balance support: During functional activity  Standing balance-Leahy Scale: Poor  Standing balance comment: Reliant on BUEs and external support for standing balance.        Special needs/care consideration   BiPAP/CPAP OSA but no CPAP  CPM  N/a  Continuous Drip IV  N/a  Dialysis n/a  Life Vest n/a  Oxygen  O2 at 2 liters [place] at HS only pta; 09/13/2018 using 1 to 2 liters [place] on acute  Special Bed  N/a  Trach Size  N/a  Wound Vac n/a  Skin rash left arm; ecchymosis to bilateral UE; ecchymosis to left buttocks region  Bowel mgmt:  continent LBM 8/11  Bladder mgmt: external catheter; uses incontinence pad at  home due to infrequent incontinence likely related to diuretic use  Diabetic mgmt: n/a  Behavioral consideration  N/a  Chemo/radiation  N/a  Spouse, [name] is Media planner       Previous Home Environment   Living Arrangements: Spouse/significant other   Lives With: Spouse  Available Help at Discharge: (spouse and daughter provide 24/7 supervision)  Type of Home: House  Home Layout: Two level, 1/2 bath on main level, Bed/bath upstairs  Alternate Level Stairs-Rails: Can reach both  Alternate Level Stairs-Number of Steps: has a stair lift  Home Access: Level entry  Bathroom Shower/Tub: Tourist information centre manager: Standard  Bathroom Accessibility: Yes  How Accessible: Accessible via walker  Home Care Services: Yes  Type of Home Care Services: Home RN, [place] (if known): [facility]     Discharge Living Setting  Plans for Discharge Living Setting: Patient's home, Lives with (comment)(spouse)  Type of Home at Discharge: House  Discharge Home Layout: Two level, 1/2 bath on main level, Bed/bath upstairs  Alternate Level Stairs-Rails: Right, Left, Can reach both(Has chair lift)  Alternate Level Stairs-Number of Steps: flight/has chair lift  Discharge Home Access: Level entry  Discharge Bathroom Shower/Tub: Walk-in shower  Discharge Bathroom Toilet: Standard  Discharge Bathroom Accessibility: Yes  How Accessible: Accessible via walker  Does the patient have any problems obtaining your medications?: No     Social/Family/Support Systems  Patient Roles: Spouse, Parent  Contact Information: spouse , [name]  Anticipated Caregiver: [name] and daughter, Danton Clap  Anticipated Caregiver's Contact Information: cell [phone]  Ability/Limitations of Caregiver: spouse has own buisiness, lift for vehicles and dtr is RN with [employer]; they arrange 24/7 assist between them  Caregiver Availability: 24/7  Discharge Plan Discussed with  Primary Caregiver: Yes  Is Caregiver In Agreement with Plan?: Yes  Does Caregiver/Family have Issues with Lodging/Transportation while Pt is in Rehab?: No        Daughter, Danton Clap is [facility] RN with [employer]. Does not live with her parents but assists with 24/7 assist when spouse working. 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     Goals/Additional Needs  Patient/Family Goal for Rehab: supervision PT, supervision to [place] OT  Expected length of stay: ELOS 10 to 12 days  Special Service Needs: Spouse, [name] is visitor designee  Pt/Family Agrees to Admission and willing to participate: Yes  Program Orientation Provided & Reviewed with Pt/Caregiver Including Roles  & Responsibilities: Yes     Decrease burden of Care through IP rehab admission: n/a     Possible need for SNF placement upon discharge: not anticipated. Has used 99 days of 100 days SNF so far this year.     Patient Condition: I have  reviewed medical records from [facility] , spoken with CM, and patient and spouse. I met with patient at the bedside for inpatient rehabilitation assessment.  Patient will benefit from ongoing PT and OT, can actively participate in 3 hours of therapy a day 5 days of the week, and can make measurable gains during the admission.  Patient will also benefit from the coordinated team approach during an Inpatient Acute Rehabilitation admission.  The patient will receive intensive therapy as well as Rehabilitation physician, nursing, social worker, and care management interventions.  Due to bladder management, bowel management, safety, skin/wound care, disease management, medication administration, pain management and patient education the patient requires 24 hour a day rehabilitation nursing.  The patient is currently min to mod assist with mobility and basic ADLs.  Discharge setting and therapy post discharge at home with home health is anticipated.  Patient has agreed to participate in the  Acute Inpatient Rehabilitation Program and will admit today.     Preadmission Screen Completed By:  Cleatrice Burke RN MSN 09/13/2018 11:23 AM  ______________________________________________________________________    Discussed status with Dr. Dagoberto Ligas on 09/13/2018  at  1140 and received approval for admission today.     Admission Coordinator:  Cleatrice [place], RN, time  1140 Date  09/13/2018      Assessment/Plan:  Diagnosis:  1.Does the need for close, 24 hr/day Medical supervision in concert with the patient's rehab needs make it unreasonable for this patient to be served in a less intensive setting? Yes   2.Co-Morbidities requiring supervision/potential complications: CHF with EF of 25%, recent CVA with new subacute CVA, admission for pneumonia, and recent ankle fx   3.Due to bladder management, safety, skin/wound care and medication administration, does the patient require 24 hr/day rehab nursing? Yes   4.Does the patient require coordinated care of a physician, rehab nurse, PT (1-2 hrs hrs/day, 5 days/week), OT (1-2 hrs hrs/day, 5 days/week) and SLP (1 hr hrs/day, 5 days/week) to address physical and functional deficits in the context of the above medical diagnosis(es)? Yes Addressing deficits in the following areas: balance, endurance, locomotion, strength, transferring, bathing, dressing, toileting, language and psychosocial support   5.Can the patient actively participate in an intensive therapy program of at least 3 hrs of therapy 5 days a week? Yes   6.The potential for patient to make measurable gains while on inpatient rehab is fair   7.Anticipated functional outcomes upon discharge from inpatients are: supervision PT, supervision OT, n/a SLP   8.Estimated rehab length of stay to reach the above functional goals is: 1-12 days   9.Anticipated D/C setting: Home with husband/daughter   10.Anticipated post D/C treatments: [place] therapy   11.Overall  Rehab/Functional Prognosis: fair      MD Signature:                  Revision History

## 2018-09-13 NOTE — Progress Notes (Deleted)
[name], MD    [name], MD  Physician  Physical Medicine and Rehabilitation     PMR Pre-admission  Signed     Date of Service:  09/13/2018 11:23 AM        Related encounter: ED to Hosp-Admission (Current) from 09/09/2018 in [facility]             Signed                    Expand widget buttonCollapse widget button    Show:Clear all   ManualTemplateCopied  Added by:     Cristina Gong, RN  [name], MD   Hover for detailscustomization button                                                                                                                                                                  PMR Admission Coordinator Pre-Admission Assessment     Patient: [name] [name] is an 73 y.o., female  [name]: [id number]  DOB: 1945/05/09  Height: 5' 2.5" (158.8 cm)  Weight: 116.2 kg     Insurance Information  HMO:     PPO:      PCP:      IPA:      80/20:      OTHER: no medicare advantage plan  PRIMARY: Medicare a and b      Policy#: [id number]      Subscriber: pt  Benefits:  Phone #: passport one online     Name: 09/13/2018  Eff. Date: 12/02/2012     Deduct: $1408      Out of Pocket Max: none      Life Max: none  CIR: 100%      SNF: has been out of SNF since 07/21/2018 where she has used 99 days since 04/10/2018. Was out of SNF 07/21/2018 at home so 49 days outside of acute hospital and SNF  Outpatient: 80%     Co-Pay: 20%  Home Health: 100%      Co-Pay: none  DME: 80%     Co-Pay: 20%  Providers: pt choice   SECONDARY: AARP      Policy#: [id number]      Subscriber: pt     Medicaid Application Date:       Case Manager:   Disability Application Date:       Case Worker:      The "Data Collection  Information Summary" for patients in Inpatient Rehabilitation Facilities with attached "Privacy Act [place] Records" was provided and verbally reviewed with: Patient     Emergency Contact Information  Contact Information            Name    Relation    Home    Work    Mobile         Kamaka,David   Spouse   [phone]       [phone]        [facility]   Daughter           [phone]                Current Medical History   Patient Admitting Diagnosis: Debility     History of Present Illness: 73 year old right-handed female history of hypertension, chronic back pain maintained on oxycodone as needed type II diabetes mellitus, hyperlipidemia, asthma using oxygen as needed at night, chronic diastolic congestive heart failure with ejection fraction 25%, CVA, CAD with CABG, atrial fibrillation maintained on Eliquis as well as Plavix followed by Dr. Loralie Champagne, CKD stage III, OSA not on CPAP. Presented 09/09/2018 with transient slurred speech, dizziness, vomiting as well as fall x3 without loss of consciousness.  Noted bruises to her left upper back and left leg.  WBC 14,500, creatinine 1.13, lactic acid 1.2, COVID negative, urinalysis negative, potassium 3.3, low-grade fever 100.9, blood pressure 177/71.  Chest x-ray showed cardiomegaly and vascular congestion with mild pulmonary edema as well as developing bronchopneumonia right lower lobe.  MRSA PCR screening positive.  X-rays of left femur and left rib negative for fracture.  CT of the head as well as MRI/MRA showed no acute ischemic infarct or intracranial hemorrhage.  Severe intracranial atherosclerosis noted.  Severe stenosis of left PCA P1 and P2 segments unchanged from compared to 06/01/2014.  Severe stenosis of both internal carotid arteries at the skull base progressed compared to 06/01/2014.  Neurology follow-up to address MRI suspect subacute to chronic right  frontal lobe infarct new from prior scan of February 2020.  It was advised to continue Eliquis and Plavix as prior to admission.  Patient maintained on vancomycin and cefepime with fevers and leukocytosis improved.  She did receive low-dose diuresis monitoring for any signs of hypoxia.  Patient is tolerating a regular consistency diet.       Complete NIHSS TOTAL: 0     Patient's medical record from [facility] has been reviewed by the rehabilitation admission coordinator and physician.     Past Medical History        Past Medical History:    Diagnosis   Date    .   Abnormality of gait   05/14/2014    .   Anemia            hx    .   Anginal pain ([place])        .   Anxiety        .   Asthma        .   Basal cell carcinoma   05/2014        "left shoulder"    .   Bundle branch block, left            chronic/notes 07/18/2013    .   CHF (congestive heart failure) ([place])        .   Chronic bronchitis ([place])            "off and on all the time" (07/18/2013)    .   Chronic insomnia   05/06/2015    .   Chronic kidney disease  frequency, sees dr Jamal Maes every 4 to 6 months (01/16/2018)    .   Chronic low back pain   08/24/2016    .   Chronic lower back pain        .   Claustrophobia        .   Common migraine   05/14/2014    .   Coronary artery disease        .   Depression        .   Dysrhythmia        .   GERD (gastroesophageal reflux disease)        .   H/O hiatal hernia        .   Headache            "at least 2/month" (01/16/2018)    .   Heart murmur        .   Hyperlipidemia        .   Hypertension        .   Irregular heart beat        .   Leg cramps            both legs at times    .   Melanoma ([place])   05/2014        "burned off BLE" (01/16/2018)    .    Memory change   05/14/2014    .   Migraine            "5-6/year"  (01/16/2018)    .   Myocardial infarction ([place])   04/1999, 02/2000, 01/2005; 2011; 2014    .   Neuromuscular disorder ([place])            ?    .   Obesity   01-2010    .   Obstructive sleep apnea            "can't wear machine; I have claustrophobia" (01/16/2018), states she had a 2nd sleep study and does not have sleep apnea, her O2 decreases and now is on 2 L of O2 at night.    .   On home oxygen therapy            "2L at night and prn during daytime" (01/16/2018)    .   Osteoarthritis            "knees and hands" (01/16/2018)    .   Other and unspecified angina pectoris        .   Peripheral vascular disease (HCC)            ? numbness, tingling arms and legs    .   Pneumonia   2000's        "once"    .   PONV (postoperative nausea and vomiting)        .   Shortness of breath            with exertion    .   Stroke ([place])   03/22/12        right side brain; denies residual on 07/18/2013)    .   Stroke [facility])   Oct. 2015        Affected pt.s balance    .   Stroke [facility])   10/2014        "affected my legs; fully recovered now"; still have sporatic memory issues from this one" (01/16/2018)    .  Type II diabetes mellitus ([place])        .   Ventral hernia            hx of    .   Vomiting            persistent    .   Vomiting blood             Family History    family history includes AAA (abdominal aortic aneurysm) in her father and mother; Cancer in her sister; Deep vein thrombosis in her father and mother; Diabetes in her father, paternal aunt, paternal grandmother, paternal uncle, and paternal uncle; Heart attack in her father; Heart attack (age of onset: 5) in her paternal grandfather; Heart disease in her father and paternal uncle; Hyperlipidemia in  her father; Hypertension in her father, mother, and sister.     Prior Rehab/Hospitalizations  Has the patient had prior rehab or hospitalizations prior to admission? Yes      SNF at [facility] in [place] 04/09/29 until 05/16/2018. At home after d/c as she got out of [name], fractured ankle. Returned to SNF until 07/21/2018 when she returned home with no weight bearing restrictions. She used 99 days of SNF benefit.     Has the patient had major surgery during 100 days prior to admission? No  115 days since ankle surgery in April 05/17/2018     Current Medications    Current Facility-Administered Medications:   .  0.9 %  sodium chloride infusion, , Intravenous, PRN, Debbe Odea, MD, Stopped at 09/12/18 0214  .  acetaminophen (TYLENOL) tablet 650 mg, 650 mg, Oral, Q4H PRN **OR** acetaminophen (TYLENOL) solution 650 mg, 650 mg, Per Tube, Q4H PRN **OR** acetaminophen (TYLENOL) suppository 650 mg, 650 mg, Rectal, Q4H PRN, Ivor Costa, MD  .  albuterol (PROVENTIL) (2.5 MG/3ML) 0.083% nebulizer solution 2.5 mg, 2.5 mg, Nebulization, Q4H PRN, Ivor Costa, MD  .  ALPRAZolam Duanne Moron) tablet 0.5 mg, 0.5 mg, Oral, BID, Ivor Costa, MD, 0.5 mg at 09/13/18 0947  .  amLODipine (NORVASC) tablet 10 mg, 10 mg, Oral, Daily, Rizwan, Saima, MD, 10 mg at 09/13/18 0947  .  apixaban (ELIQUIS) tablet 5 mg, 5 mg, Oral, BID, Aroor, Lanice Schwab, MD, 5 mg at 09/13/18 0947  .  bisacodyl (DULCOLAX) EC tablet 5 mg, 5 mg, Oral, Daily PRN, Ivor Costa, MD  .  budesonide (PULMICORT) nebulizer solution 0.25 mg, 0.25 mg, Nebulization, BID, Ivor Costa, MD, 0.25 mg at 09/13/18 0835  .  buPROPion (WELLBUTRIN XL) 24 hr tablet 150 mg, 150 mg, Oral, Daily, Ivor Costa, MD, 150 mg at 09/13/18 0947  .  Chlorhexidine Gluconate Cloth 2 % PADS 6 each, 6 each, Topical, Q0600, Debbe Odea, MD, 6 each at 09/13/18 [phone]  .  clopidogrel (PLAVIX) tablet 75 mg, 75 mg, Oral, QHS, Ivor Costa, MD, 75 mg at 09/12/18 2207  .   dextromethorphan-guaiFENesin (MUCINEX DM) 30-600 MG per 12 hr tablet 1 tablet, 1 tablet, Oral, BID PRN, Ivor Costa, MD  .  DULoxetine (CYMBALTA) DR capsule 30 mg, 30 mg, Oral, Daily, Ivor Costa, MD, 30 mg at 09/13/18 0947  .  ezetimibe (ZETIA) tablet 10 mg, 10 mg, Oral, QHS, Ivor Costa, MD, 10 mg at 09/12/18 2207  .  gabapentin (NEURONTIN) capsule 600 mg, 600 mg, Oral, BID, Ivor Costa, MD, 600 mg at 09/13/18 0948  .  hydrALAZINE (APRESOLINE) injection 5 mg, 5 mg, Intravenous, Q2H PRN, Ivor Costa, MD, 5 mg at 09/10/18 0757  .  insulin aspart (novoLOG) injection 0-9 Units, 0-9 Units, Subcutaneous, TID WC, Ivor Costa, MD, 2 Units at 09/13/18 [phone]  .  insulin aspart (novoLOG) injection 4 Units, 4 Units, Subcutaneous, TID WC, Debbe Odea, MD, 4 Units at 09/13/18 [phone]  .  insulin glargine (LANTUS) injection 28 Units, 28 Units, Subcutaneous, QHS, Debbe Odea, MD, 28 Units at 09/12/18 2221  .  ipratropium-albuterol (DUONEB) 0.5-2.5 (3) MG/3ML nebulizer solution 3 mL, 3 mL, Nebulization, BID PRN, Ivor Costa, MD  .  levofloxacin (LEVAQUIN) tablet 500 mg, 500 mg, Oral, Q24H, Adhikari, Amrit, MD  .  metoCLOPramide (REGLAN) injection 10 mg, 10 mg, Intravenous, Q12H PRN, Aroor, Karena Addison R, MD  .  metolazone (ZAROXOLYN) tablet 2.5 mg, 2.5 mg, Oral, Daily, Ivor Costa, MD, 2.5 mg at 09/13/18 0947  .  metoprolol succinate (TOPROL-XL) 24 hr tablet 50 mg, 50 mg, Oral, Daily, Rizwan, Saima, MD, 50 mg at 09/13/18 0948  .  mometasone-formoterol (DULERA) 200-5 MCG/ACT inhaler 2 puff, 2 puff, Inhalation, BID, Ivor Costa, MD, 2 puff at 09/13/18 [phone]  .  multivitamin (PROSIGHT) tablet 1 tablet, 1 tablet, Oral, Daily, Ivor Costa, MD, 1 tablet at 09/13/18 0948  .  mupirocin ointment (BACTROBAN) 2 % 1 application, 1 application, Nasal, BID, Debbe Odea, MD, 1 application at 62/95/28 0948  .  nitroGLYCERIN (NITROSTAT) SL tablet 0.3 mg, 0.3 mg, Sublingual, Q5 Min x 3 PRN, Ivor Costa, MD  .  ondansetron  [facility]) injection 4 mg, 4 mg, Intravenous, Q8H PRN, Ivor Costa, MD  .  oxyCODONE (Oxy IR/ROXICODONE) immediate release tablet 5 mg, 5 mg, Oral, Q6H PRN, Ivor Costa, MD  .  pantoprazole (PROTONIX) EC tablet 40 mg, 40 mg, Oral, Daily, Ivor Costa, MD, 40 mg at 09/13/18 0947  .  ranolazine (RANEXA) 12 hr tablet 1,000 mg, 1,000 mg, Oral, Q12H, Ivor Costa, MD, 1,000 mg at 09/13/18 0947  .  rosuvastatin (CRESTOR) tablet 40 mg, 40 mg, Oral, QHS, Ivor Costa, MD, 40 mg at 09/12/18 2205  .  sodium chloride flush (NS) 0.9 % injection 3 mL, 3 mL, Intravenous, Once, Ivor Costa, MD  .  triamcinolone cream (KENALOG) 0.1 % 1 application, 1 application, Topical, Daily, Ivor Costa, MD, 1 application at 41/32/44 [phone]  .  vitamin B-12 (CYANOCOBALAMIN) tablet 1,000 mcg, 1,000 mcg, Oral, Daily, Ivor Costa, MD, 1,000 mcg at 09/13/18 0947  .  zolpidem (AMBIEN) tablet 5 mg, 5 mg, Oral, QHS, Ivor Costa, MD, 5 mg at 09/12/18 2207     Patients Current Diet:       Diet Order                                       Diet - low sodium heart healthy                  Diet heart healthy/carb modified Room service appropriate? Yes; Fluid consistency: Thin; Fluid restriction: 1200 mL Fluid  Diet effective now                              Precautions / Restrictions  Precautions  Precautions: Fall  Precaution Comments: hx of falls at home (~8 in last 6 months)  Restrictions  Weight Bearing Restrictions: No      Has the patient had 2 or more falls or a fall with injury in the past year? Yes Patient reports  4 falls since d/c from [facility] 07/21/2018. She has issues with low heart rate and BP at times.     Prior Activity Level  Limited Community (1-2x/wk): decline in function since February hospitalization     Prior Functional Level  Self Care: Did the patient need help bathing, dressing, using the toilet or eating? Needed some help     Indoor Mobility: Did the  patient need assistance with walking from room to room (with or without device)? Independent     Stairs: Did the patient need assistance with internal or external stairs (with or without device)? Needed some help     Functional Cognition: Did the patient need help planning regular tasks such as shopping or remembering to take medications? Independent     Home Equities trader / Equipment  Home Assistive Devices/Equipment: Wheelchair, Shower chair without back, CBG Meter, Environmental consultant (specify type)  Home Equipment: Walker - 4 wheels, Shower seat     Prior Device Use: Indicate devices/aids used by the patient prior to current illness, exacerbation or injury? Walker     Current Functional Level   Cognition      Arousal/Alertness: Awake/alert  Overall Cognitive Status: Within Functional Limits for tasks assessed  Orientation Level: Oriented X4  Memory: Impaired  Memory Impairment: Retrieval deficit, Decreased short term memory, Other (comment)(baseline deficits)  Awareness: Appears intact  Problem Solving: Appears intact  Safety/Judgment: Appears intact       Extremity Assessment  (includes Sensation/Coordination)      Upper Extremity Assessment: Generalized weakness   Lower Extremity Assessment: Defer to PT evaluation        ADLs      Overall ADL's : Needs assistance/impaired  Eating/Feeding: Independent, Sitting  Grooming: Set up, Sitting  Upper Body Bathing: Minimal assistance, Sitting  Lower Body Bathing: Minimal assistance, Sit to/from stand  Upper Body Dressing : Minimal assistance, Sitting  Lower Body Dressing: Minimal assistance, Sit to/from stand  Toilet Transfer: Minimal assistance, Ambulation, RW  Functional mobility during ADLs: Minimal assistance        Mobility      Overal bed mobility: Needs Assistance  Bed Mobility: Supine to Sit  Supine to sit: Min assist  Sit to supine: Mod assist  General bed mobility  comments: use of rail and increased time and effort needed; assist to bring hips to EOB with use of bed pad        Transfers      Overall transfer level: Needs assistance  Equipment used: Rolling walker (2 wheeled)  Transfers: Sit to/from Stand  Sit to Stand: Min guard  Stand pivot transfers: Mod assist  General transfer comment: min guard for safety from EOB and recliner; cues for safe hand placement        Ambulation / Gait / Stairs / Wheelchair Mobility      Ambulation/Gait  Ambulation/Gait assistance: Min assist, Mod assist  Gait Distance (Feet): (60 ft then ~6 ft)  Assistive device: Rolling walker (2 wheeled)  Gait Pattern/deviations: Step-through pattern, Step-to pattern, Decreased stride length  General Gait Details: pt with decreased cadence and stride length; initial gait bout pt demonstrates R LE weakness and near buckling and after seate rest break pt agreeable to attempt further ambulation however bilat LE weakness was significant and tolerated only ~6 ft; chair follow for safety   Gait velocity: decreased        Posture / Balance   Balance  Overall balance assessment: Needs assistance, History of Falls  Sitting-balance support: Feet supported,  No upper extremity supported  Sitting balance-Leahy Scale: Fair  Standing balance support: During functional activity  Standing balance-Leahy Scale: Poor  Standing balance comment: Reliant on BUEs and external support for standing balance.        Special needs/care consideration   BiPAP/CPAP OSA but no CPAP  CPM  N/a  Continuous Drip IV  N/a  Dialysis n/a  Life Vest n/a  Oxygen  O2 at 2 liters [place] at HS only pta; 09/13/2018 using 1 to 2 liters [place] on acute  Special Bed  N/a  Trach Size  N/a  Wound Vac n/a  Skin rash left arm; ecchymosis to bilateral UE; ecchymosis to left buttocks region  Bowel mgmt:  continent LBM 8/11  Bladder mgmt: external catheter; uses incontinence pad at  home due to infrequent incontinence likely related to diuretic use  Diabetic mgmt: n/a  Behavioral consideration  N/a  Chemo/radiation  N/a  Spouse, [name] is Media planner       Previous Home Environment   Living Arrangements: Spouse/significant other   Lives With: Spouse  Available Help at Discharge: (spouse and daughter provide 24/7 supervision)  Type of Home: House  Home Layout: Two level, 1/2 bath on main level, Bed/bath upstairs  Alternate Level Stairs-Rails: Can reach both  Alternate Level Stairs-Number of Steps: has a stair lift  Home Access: Level entry  Bathroom Shower/Tub: Tourist information centre manager: Standard  Bathroom Accessibility: Yes  How Accessible: Accessible via walker  Home Care Services: Yes  Type of Home Care Services: Home RN, [place] (if known): [facility]     Discharge Living Setting  Plans for Discharge Living Setting: Patient's home, Lives with (comment)(spouse)  Type of Home at Discharge: House  Discharge Home Layout: Two level, 1/2 bath on main level, Bed/bath upstairs  Alternate Level Stairs-Rails: Right, Left, Can reach both(Has chair lift)  Alternate Level Stairs-Number of Steps: flight/has chair lift  Discharge Home Access: Level entry  Discharge Bathroom Shower/Tub: Walk-in shower  Discharge Bathroom Toilet: Standard  Discharge Bathroom Accessibility: Yes  How Accessible: Accessible via walker  Does the patient have any problems obtaining your medications?: No     Social/Family/Support Systems  Patient Roles: Spouse, Parent  Contact Information: spouse , [name]  Anticipated Caregiver: [name] and daughter, Danton Clap  Anticipated Caregiver's Contact Information: cell [phone]  Ability/Limitations of Caregiver: spouse has own buisiness, lift for vehicles and dtr is RN with [employer]; they arrange 24/7 assist between them  Caregiver Availability: 24/7  Discharge Plan Discussed with  Primary Caregiver: Yes  Is Caregiver In Agreement with Plan?: Yes  Does Caregiver/Family have Issues with Lodging/Transportation while Pt is in Rehab?: No        Daughter, Danton Clap is [facility] RN with [employer]. Does not live with her parents but assists with 24/7 assist when spouse working. 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     Goals/Additional Needs  Patient/Family Goal for Rehab: supervision PT, supervision to [place] OT  Expected length of stay: ELOS 10 to 12 days  Special Service Needs: Spouse, [name] is visitor designee  Pt/Family Agrees to Admission and willing to participate: Yes  Program Orientation Provided & Reviewed with Pt/Caregiver Including Roles  & Responsibilities: Yes     Decrease burden of Care through IP rehab admission: n/a     Possible need for SNF placement upon discharge: not anticipated. Has used 99 days of 100 days SNF so far this year.     Patient Condition: I have  reviewed medical records from [facility] , spoken with CM, and patient and spouse. I met with patient at the bedside for inpatient rehabilitation assessment.  Patient will benefit from ongoing PT and OT, can actively participate in 3 hours of therapy a day 5 days of the week, and can make measurable gains during the admission.  Patient will also benefit from the coordinated team approach during an Inpatient Acute Rehabilitation admission.  The patient will receive intensive therapy as well as Rehabilitation physician, nursing, social worker, and care management interventions.  Due to bladder management, bowel management, safety, skin/wound care, disease management, medication administration, pain management and patient education the patient requires 24 hour a day rehabilitation nursing.  The patient is currently min to mod assist with mobility and basic ADLs.  Discharge setting and therapy post discharge at home with home health is anticipated.  Patient has agreed to participate in the  Acute Inpatient Rehabilitation Program and will admit today.     Preadmission Screen Completed By:  Cleatrice Burke RN MSN 09/13/2018 11:23 AM  ______________________________________________________________________    Discussed status with Dr. Dagoberto Ligas on 09/13/2018  at  1140 and received approval for admission today.     Admission Coordinator:  Cleatrice Burke, RN, time  1140 Date  09/13/2018      Assessment/Plan:  Diagnosis:  1.Does the need for close, 24 hr/day Medical supervision in concert with the patient's rehab needs make it unreasonable for this patient to be served in a less intensive setting? Yes   2.Co-Morbidities requiring supervision/potential complications: CHF with EF of 25%, recent CVA with new subacute CVA, admission for pneumonia, and recent ankle fx   3.Due to bladder management, safety, skin/wound care and medication administration, does the patient require 24 hr/day rehab nursing? Yes   4.Does the patient require coordinated care of a physician, rehab nurse, PT (1-2 hrs hrs/day, 5 days/week), OT (1-2 hrs hrs/day, 5 days/week) and SLP (1 hr hrs/day, 5 days/week) to address physical and functional deficits in the context of the above medical diagnosis(es)? Yes Addressing deficits in the following areas: balance, endurance, locomotion, strength, transferring, bathing, dressing, toileting, language and psychosocial support   5.Can the patient actively participate in an intensive therapy program of at least 3 hrs of therapy 5 days a week? Yes   6.The potential for patient to make measurable gains while on inpatient rehab is fair   7.Anticipated functional outcomes upon discharge from inpatients are: supervision PT, supervision OT, n/a SLP   8.Estimated rehab length of stay to reach the above functional goals is: 1-12 days   9.Anticipated D/C setting: Home with husband/daughter   10.Anticipated post D/C treatments: [place] therapy   11.Overall  Rehab/Functional Prognosis: fair      MD Signature:                  Revision History

## 2018-09-13 NOTE — Discharge Summary (Signed)
Physician Discharge Summary  [name] [name] VHQ:[id number] DOB: May 10, 1945 DOA: 09/09/2018  PCP: Derinda Late, MD  Admit date: 09/09/2018 Discharge date: 09/13/2018  Admitted From: Home Disposition:  Home  Discharge Condition:Stable CODE STATUS:FULL Diet recommendation: Heart Healthy  Brief/Interim Summary:  [name] [name] is a 73 y.o.femalewith medical history significant ofhypertension, hyperlipidemia, diabetes mellitus, asthma, stroke, GERD, depression with anxiety, PVD, OSA not on CPAP, CAD, stent, CABG,sCHF with EF 25%, memory change, atrial fibrillation on Eliquis, left bundle blockage, CKD stage III, who presents with slurred speech, dizziness, fall, fever. She has fallen 3 x at home.  On further work-up, chest x-ray showed vascular congestion with pulmonary edema, focal airspace consolidation in the right lung base which could suggest superimposed developing Bronchopneumonia.  She was started on diuresis and antibiotics.  Currently her respiratory status stable.  She needs minimal oxygen requirement at present.  She uses oxygen at night at home.  She was evaluated PT/OT and recommended CIR.  She is hemodynamically stable for discharge to CIR today.  Following problems were addressed during her hospitalization:  Slurred speech  -Neuro feels that she has recrudescence of previous symptoms due to her pneumonia - MRI/ MRA reports above-neuro does not feel that any changes needs to be made to her medication regimen-continue Eliquis : Acute respiratory failure due to Acute on chronic  systolic CHF and lobar pneumonia - CXR in ED suggestive of pulmonary edema - repeat chest x-ray 8/9 CXR showing pulmonary edema and developing bronchopneumonia in RLL - MRSA + - she was started on Vanc and Cefepime.Abx changed to oral today.  Currently hemodynamically stable, afebrile.  Acute on chronic systolic CHF Follows with cardiology.  On torsemide, metolazone, spironolactone at home.  Last  echocardiogram showed ejection fraction of 25 to 30%.  Continue home medicines.  CKD 3 - Kidney function near baseline  Hypokalemia -Supplemented and corrected   Paroxysmal Atrial fibrillation  - cont Eliquis and Toprol     Falls - PT eval- recommending CIR  Nausea - Vomiting - Resolved- tolerating diet  Type II diabetes mellitus with renal manifestations  - A1c of  7 - Continue home regimen  Asthma - No wheezing at the moment- no exacerbation     Depression with anxiety - On Cymbalta + Wellbutrin    Hyperlipemia - On cont Statin + Zetia    Essential hypertension - cont Norvasc, Toprol & diuretics    GERD - cont Protonix    CAD (coronary artery disease) of bypass graft - on Plavix, statin, Eliquis    Discharge Diagnoses:  Principal Problem:   Slurred speech Active Problems:   Hyperlipemia   Essential hypertension   GERD   CAD (coronary artery disease) of bypass graft   Atrial fibrillation (HCC)   Chronic systolic CHF (congestive heart failure) ([place])   Fall   Stroke ([place])   Type II diabetes mellitus with renal manifestations (HCC)   CKD (chronic kidney disease), stage III ([place])   Sepsis ([place])   Depression with anxiety    Discharge Instructions  Discharge Instructions    Diet - low sodium heart healthy   Complete by: As directed    Discharge instructions   Complete by: As directed    1) Take prescribed medicine as instructed. 2) Continue your home medicines.   Increase activity slowly   Complete by: As directed      Allergies as of 09/13/2018      Reactions   Amoxicillin Shortness Of Breath, Rash   Brilinta [  ticagrelor] Shortness Of Breath   Erythromycin Shortness Of Breath, Other (See Comments), Hives   Trouble swallowing   Flagyl [metronidazole] Shortness Of Breath, Palpitations   Penicillins Hives, Shortness Of Breath, Rash, Other (See Comments)   Has patient had a PCN reaction causing immediate rash, facial/tongue/throat  swelling, SOB or lightheadedness with hypotension: Yes Has patient had a PCN reaction causing severe rash involving mucus membranes or skin necrosis: No Has patient had a PCN reaction that required hospitalization: Yes Has patient had a PCN reaction occurring within the last 10 years: No If all of the above answers are "NO", then may proceed with Cephalosporin use.   Isosorbide Mononitrate [isosorbide Nitrate] Other (See Comments)   Joint aches, muscles hurt, difficult to walk   Jardiance [empagliflozin] Other (See Comments)   Nausea, joint aches, muscles aches   Metformin And Related Other (See Comments)   Stomach pain, cold sweats, joint pain, burred vision, dizziness   Tape Other (See Comments)   Skin pulls off with certain types Plastic tape causes skin to rip if left on for long periods of time   Erythromycin Base Rash      Medication List    TAKE these medications   Advair HFA 115-21 MCG/ACT inhaler Generic drug: fluticasone-salmeterol Inhale 2 puffs into the lungs.   albuterol 108 (90 Base) MCG/ACT inhaler Commonly known as: VENTOLIN HFA Inhale 2 puffs into the lungs every 6 (six) hours as needed for wheezing.   ALPRAZolam 0.5 MG tablet Commonly known as: XANAX Take 1 tablet (0.5 mg total) by mouth 2 (two) times daily.   amLODipine 10 MG tablet Commonly known as: NORVASC Take 10 mg by mouth daily.   apixaban 5 MG Tabs tablet Commonly known as: ELIQUIS Take 1 tablet (5 mg total) by mouth 2 (two) times daily.   bisacodyl 5 MG EC tablet Commonly known as: Dulcolax Take 1 tablet (5 mg total) by mouth daily as needed for moderate constipation.   budesonide 0.25 MG/2ML nebulizer solution Commonly known as: PULMICORT Take 2 mLs (0.25 mg total) by nebulization 2 (two) times daily.   buPROPion 150 MG 24 hr tablet Commonly known as: WELLBUTRIN XL Take 150 mg by mouth daily.   clopidogrel 75 MG tablet Commonly known as: PLAVIX Take 1 tablet (75 mg total) by mouth at  bedtime.   DULoxetine 30 MG capsule Commonly known as: CYMBALTA Take 1 capsule (30 mg total) by mouth daily.   ezetimibe 10 MG tablet Commonly known as: ZETIA Take 1 tablet (10 mg total) by mouth at bedtime.   gabapentin 300 MG capsule Commonly known as: NEURONTIN Take 2 capsules (600 mg total) by mouth 2 (two) times daily.   Healthy Eyes/Lutein Tabs Take 1 tablet by mouth daily.   insulin aspart 100 UNIT/ML injection Commonly known as: novoLOG Inject 8 Units into the skin 3 (three) times daily with meals. What changed:   how much to take  when to take this   insulin glargine 100 UNIT/ML injection Commonly known as: LANTUS Inject 0.35 mLs (35 Units total) into the skin at bedtime. What changed: how much to take   ipratropium-albuterol 0.5-2.5 (3) MG/3ML Soln Commonly known as: DUONEB Take 3 mLs by nebulization 2 (two) times daily. What changed:   when to take this  reasons to take this   levofloxacin 500 MG tablet Commonly known as: LEVAQUIN Take 1 tablet (500 mg total) by mouth daily for 3 days.   metolazone 2.5 MG tablet Commonly known as: ZAROXOLYN  Take 2.5 mg by mouth daily.   metoprolol succinate 50 MG 24 hr tablet Commonly known as: TOPROL-XL Take 25mg  daily with or immediately following a meal.   nitroGLYCERIN 0.3 MG SL tablet Commonly known as: NITROSTAT Place 1 tablet (0.3 mg total) under the tongue every 5 (five) minutes x 3 doses as needed for chest pain.   ondansetron 4 MG tablet Commonly known as: ZOFRAN Take 4 mg by mouth every 6 (six) hours as needed for nausea/vomiting.   oxyCODONE 5 MG immediate release tablet Commonly known as: Oxy IR/ROXICODONE Take 1 tablet (5 mg total) by mouth every 4 (four) hours as needed for moderate pain or severe pain.   OXYGEN Inhale 2 L into the lungs at bedtime.   pantoprazole 40 MG tablet Commonly known as: PROTONIX Take 1 tablet (40 mg total) by mouth daily.   potassium chloride SA 20 MEQ  tablet Commonly known as: K-DUR TAKE ONE (1) TABLET BY MOUTH TWO (2) TIMES DAILY What changed: See the new instructions.   Praluent 150 MG/ML Soaj Generic drug: Alirocumab Inject 1 pen into the skin every 14 (fourteen) days.   Prolia 60 MG/ML Sosy injection Generic drug: denosumab Inject 69 mg into the skin every 6 (six) months.   ranolazine 1000 MG SR tablet Commonly known as: RANEXA Take 1,000 mg by mouth every 12 (twelve) hours.   rosuvastatin 40 MG tablet Commonly known as: CRESTOR Take 1 tablet (40 mg total) by mouth at bedtime.   spironolactone 25 MG tablet Commonly known as: ALDACTONE Take 1 tablet (25 mg total) by mouth daily.   torsemide 20 MG tablet Commonly known as: DEMADEX Take 1 tablet (20 mg total) by mouth every other day.   traMADol 50 MG tablet Commonly known as: ULTRAM Take 1 tablet (50 mg total) by mouth every 6 (six) hours as needed for moderate pain.   triamcinolone cream 0.1 % Commonly known as: KENALOG Apply 1 application topically daily.   vitamin B-12 1000 MCG tablet Commonly known as: CYANOCOBALAMIN Take 1,000 mcg by mouth daily.   zolpidem 10 MG tablet Commonly known as: AMBIEN Take 10 mg by mouth every evening.      Follow-up Information    Derinda Late, MD. Schedule an appointment as soon as possible for a visit in 1 week(s).   Specialty: Family Medicine Contact information: [place] [address] [phone]          Allergies  Allergen Reactions  . Amoxicillin Shortness Of Breath and Rash  . Brilinta [Ticagrelor] Shortness Of Breath  . Erythromycin Shortness Of Breath, Other (See Comments) and Hives    Trouble swallowing  . Flagyl [Metronidazole] Shortness Of Breath and Palpitations  . Penicillins Hives, Shortness Of Breath, Rash and Other (See Comments)    Has patient had a PCN reaction causing immediate rash, facial/tongue/throat swelling, SOB or lightheadedness with hypotension: Yes Has  patient had a PCN reaction causing severe rash involving mucus membranes or skin necrosis: No Has patient had a PCN reaction that required hospitalization: Yes Has patient had a PCN reaction occurring within the last 10 years: No If all of the above answers are "NO", then may proceed with Cephalosporin use.   . Isosorbide Mononitrate [Isosorbide Nitrate] Other (See Comments)    Joint aches, muscles hurt, difficult to walk   . Jardiance [Empagliflozin] Other (See Comments)    Nausea, joint aches, muscles aches  . Metformin And Related Other (See Comments)    Stomach pain, cold  sweats, joint pain, burred vision, dizziness  . Tape Other (See Comments)    Skin pulls off with certain types Plastic tape causes skin to rip if left on for long periods of time  . Erythromycin Base Rash    Consultations:  None   Procedures/Studies: Dg Ribs Unilateral Left  Result Date: 09/09/2018 CLINICAL DATA:  Recent falls.  Left leg and rib pain. EXAM: LEFT RIBS - 2 VIEW COMPARISON:  Chest radiographs today.  Chest CT 04/03/2018. FINDINGS: Two views demonstrate no evidence of acute left-sided rib fracture or focal rib lesion. As noted on earlier radiographs, there is probable pulmonary edema with stable cardiomegaly. No significant pleural effusion or pneumothorax. There are postsurgical changes in the left upper quadrant of the abdomen. IMPRESSION: No evidence of left-sided rib fracture, pleural effusion or pneumothorax. Electronically Signed   By: Richardean Sale M.D.   On: 09/09/2018 18:20   Ct Head Wo Contrast  Result Date: 09/09/2018 CLINICAL DATA:  Head trauma. High clinical risk. Slurred speech. EXAM: CT HEAD WITHOUT CONTRAST TECHNIQUE: Contiguous axial images were obtained from the base of the skull through the vertex without intravenous contrast. COMPARISON:  March 29, 2018 FINDINGS: Brain: No evidence of acute hemorrhage, hydrocephalus, extra-axial collection or mass lesion/mass effect. Chronic  right occipital lobe infarct. New areas of hypoattenuation in the subcortical white matter of the right frontal lobe may represent small age-indeterminate ischemic infarcts. Vascular: Calcific atherosclerotic disease of the intra cavernous carotid arteries. Skull: Normal. Negative for fracture or focal lesion. Sinuses/Orbits: Minimal opacification of the left mastoid air cells. Paranasal sinuses are clear. Other: None. IMPRESSION: 1. New areas of hypoattenuation in the subcortical white matter of the right frontal lobe may represent small age-indeterminate ischemic infarcts. 2. Chronic right occipital lobe infarct. 3. No evidence of traumatic injury to the brain. Electronically Signed   By: Fidela Salisbury M.D.   On: 09/09/2018 17:29   Mr Angio Head Wo Contrast  Result Date: 09/10/2018 CLINICAL DATA:  Weakness and dizziness for the past several days. Mild slurred speech developed today. EXAM: MRI HEAD WITHOUT CONTRAST MRA HEAD WITHOUT CONTRAST TECHNIQUE: Multiplanar, multiecho pulse sequences of the brain and surrounding structures were obtained without intravenous contrast. Angiographic images of the head were obtained using MRA technique without contrast. COMPARISON:  Brain MRI 10/07/2013 and MRA head 06/01/2014 FINDINGS: MRI HEAD FINDINGS BRAIN: There is no acute infarct, acute hemorrhage or extra-axial collection. There is an old right PCA territory infarct. Multiple areas of rounded hyperintense T2-weighted signal are present within the white matter of both hemispheres. The cerebral and cerebellar volume are age-appropriate. There is no hydrocephalus. The midline structures are normal. VASCULAR: There are 3-5 scattered foci of chronic microhemorrhage. The major intracranial arterial and venous sinus flow voids are normal. SKULL AND UPPER CERVICAL SPINE: Calvarial bone marrow signal is normal. There is no skull base mass. The visualized upper cervical spine and soft tissues are normal. SINUSES/ORBITS:  There are no fluid levels or advanced mucosal thickening. The mastoid air cells and middle ear cavities are free of fluid. The orbits are normal. MRA HEAD FINDINGS POSTERIOR CIRCULATION: --Vertebral arteries: Normal V4 segments. --Posterior inferior cerebellar arteries (PICA): Patent origins from the vertebral arteries. --Anterior inferior cerebellar arteries (AICA): Not clearly visualized, though this is not uncommon. --Basilar artery: Proximal basilar artery is severely narrowed, but remains patent. --Superior cerebellar arteries: Normal. --Posterior cerebral arteries (PCA): The right PCA arises from the basilar artery with a diminutive right posterior communicating artery (P-comm). There is no  right PCA occlusion or proximal stenosis. The left PCA originates combination from the basilar artery and AE small left P-comm. There is severe stenosis of the P1 and P2 segments. ANTERIOR CIRCULATION: --Intracranial internal carotid arteries: There is multifocal severe stenosis of both internal carotid arteries at the skull base. --Anterior cerebral arteries (ACA): Limited enhancement of the left A1 segment. Otherwise normal. --Middle cerebral arteries (MCA): There is mild narrowing within some of the distal right MCA M2 branches, but the right MCA is otherwise normal. There is loss of flow related enhancement at the left M1 segment proximally. The distal M1 segment enhances normally. The left M2 branches are patent. IMPRESSION: 1. No acute ischemic infarct or intracranial hemorrhage. 2. Multiple rounded areas of peripheral hyperintense T2-weighted signal within the bilateral supratentorial white matter, corresponding to the areas of hypoattenuation on the earlier head CT. These likely indicate late subacute to early chronic infarcts, not present on prior MRIs. No diffusion restriction to indicate acute ischemia. 3. Severe intracranial atherosclerosis with short segment occlusion of the proximal left middle cerebral artery  with normal enhancement of the distal left M1 segment and the M2 branches, new compared to 06/01/2014. 4. Severe stenosis of the left PCA P1 and P2 segments, unchanged compared to 06/01/2014. 5. Severe stenoses of both internal carotid arteries at the skull base, progressed compared to 06/01/2014. Electronically Signed   By: Ulyses Jarred M.D.   On: 09/10/2018 02:19   Mr Brain Wo Contrast  Result Date: 09/10/2018 CLINICAL DATA:  Weakness and dizziness for the past several days. Mild slurred speech developed today. EXAM: MRI HEAD WITHOUT CONTRAST MRA HEAD WITHOUT CONTRAST TECHNIQUE: Multiplanar, multiecho pulse sequences of the brain and surrounding structures were obtained without intravenous contrast. Angiographic images of the head were obtained using MRA technique without contrast. COMPARISON:  Brain MRI 10/07/2013 and MRA head 06/01/2014 FINDINGS: MRI HEAD FINDINGS BRAIN: There is no acute infarct, acute hemorrhage or extra-axial collection. There is an old right PCA territory infarct. Multiple areas of rounded hyperintense T2-weighted signal are present within the white matter of both hemispheres. The cerebral and cerebellar volume are age-appropriate. There is no hydrocephalus. The midline structures are normal. VASCULAR: There are 3-5 scattered foci of chronic microhemorrhage. The major intracranial arterial and venous sinus flow voids are normal. SKULL AND UPPER CERVICAL SPINE: Calvarial bone marrow signal is normal. There is no skull base mass. The visualized upper cervical spine and soft tissues are normal. SINUSES/ORBITS: There are no fluid levels or advanced mucosal thickening. The mastoid air cells and middle ear cavities are free of fluid. The orbits are normal. MRA HEAD FINDINGS POSTERIOR CIRCULATION: --Vertebral arteries: Normal V4 segments. --Posterior inferior cerebellar arteries (PICA): Patent origins from the vertebral arteries. --Anterior inferior cerebellar arteries (AICA): Not clearly  visualized, though this is not uncommon. --Basilar artery: Proximal basilar artery is severely narrowed, but remains patent. --Superior cerebellar arteries: Normal. --Posterior cerebral arteries (PCA): The right PCA arises from the basilar artery with a diminutive right posterior communicating artery (P-comm). There is no right PCA occlusion or proximal stenosis. The left PCA originates combination from the basilar artery and AE small left P-comm. There is severe stenosis of the P1 and P2 segments. ANTERIOR CIRCULATION: --Intracranial internal carotid arteries: There is multifocal severe stenosis of both internal carotid arteries at the skull base. --Anterior cerebral arteries (ACA): Limited enhancement of the left A1 segment. Otherwise normal. --Middle cerebral arteries (MCA): There is mild narrowing within some of the distal right MCA M2 branches, but  the right MCA is otherwise normal. There is loss of flow related enhancement at the left M1 segment proximally. The distal M1 segment enhances normally. The left M2 branches are patent. IMPRESSION: 1. No acute ischemic infarct or intracranial hemorrhage. 2. Multiple rounded areas of peripheral hyperintense T2-weighted signal within the bilateral supratentorial white matter, corresponding to the areas of hypoattenuation on the earlier head CT. These likely indicate late subacute to early chronic infarcts, not present on prior MRIs. No diffusion restriction to indicate acute ischemia. 3. Severe intracranial atherosclerosis with short segment occlusion of the proximal left middle cerebral artery with normal enhancement of the distal left M1 segment and the M2 branches, new compared to 06/01/2014. 4. Severe stenosis of the left PCA P1 and P2 segments, unchanged compared to 06/01/2014. 5. Severe stenoses of both internal carotid arteries at the skull base, progressed compared to 06/01/2014. Electronically Signed   By: Ulyses Jarred M.D.   On: 09/10/2018 02:19   Dg Chest  Port 1 View  Result Date: 09/10/2018 CLINICAL DATA:  74 year old female with history of dyspnea. EXAM: PORTABLE CHEST 1 VIEW COMPARISON:  Chest x-ray 09/09/2018. FINDINGS: There is cephalization of the pulmonary vasculature and slight indistinctness of the interstitial markings suggestive of mild pulmonary edema. More focal consolidative airspace disease in the right lung base. No definite pleural effusions. Mild cardiomegaly. Upper mediastinal contours are within normal limits. Aortic atherosclerosis. Status post median sternotomy for CABG. IMPRESSION: 1. The appearance the chest suggests congestive heart failure, as above. In addition, there is more focal airspace consolidation in the right lung base which could suggest superimposed developing bronchopneumonia. Further clinical evaluation is suggested. 2. Aortic atherosclerosis. Electronically Signed   By: Vinnie Langton M.D.   On: 09/10/2018 10:31   Dg Chest Port 1 View  Result Date: 09/09/2018 CLINICAL DATA:  Shortness of breath multiple recent falls and slurred speech. History of diabetes and stroke. EXAM: PORTABLE CHEST 1 VIEW COMPARISON:  Radiographs 05/18/2018 and 04/05/2018.  CT 04/03/2018. FINDINGS: 1549 hours. Stable cardiomegaly post median sternotomy and CABG. Pulmonary vascular congestion has slightly worsened, and there is probable mild pulmonary edema. No confluent airspace opacity, pleural effusion or pneumothorax. The bones appear intact. Telemetry leads overlie the chest. IMPRESSION: Cardiomegaly with increased vascular congestion and probable mild pulmonary edema. No confluent airspace opacity or pneumothorax. Electronically Signed   By: Richardean Sale M.D.   On: 09/09/2018 16:31   Dg Femur Min 2 Views Left  Result Date: 09/09/2018 CLINICAL DATA:  Recent falls.  Left thigh pain. EXAM: LEFT FEMUR 2 VIEWS COMPARISON:  Left hip radiographs 07/31/2018. FINDINGS: The mineralization and alignment are normal. There is no evidence of acute  fracture or dislocation. Stable mild degenerative changes at the left hip and knee. There are scattered vascular calcifications and vascular clips. No unexpected foreign bodies. IMPRESSION: No evidence of acute fracture or dislocation. Electronically Signed   By: Richardean Sale M.D.   On: 09/09/2018 18:22   Vas US Carotid  Result Date: 09/11/2018 Carotid Arterial Duplex Study Indications:       CVA. Risk Factors:      Hypertension, hyperlipidemia, Diabetes, coronary artery                    disease, prior CVA. Comparison Study:  05/23/17 Performing Technologist: Abram Sander RVS  Examination Guidelines: A complete evaluation includes B-mode imaging, spectral Doppler, color Doppler, and power Doppler as needed of all accessible portions of each vessel. Bilateral testing is considered an integral  part of a complete examination. Limited examinations for reoccurring indications may be performed as noted.  Right Carotid Findings: +----------+--------+--------+--------+------------+---------+           PSV cm/sEDV cm/sStenosisDescribe    Comments  +----------+--------+--------+--------+------------+---------+ CCA Prox  34      8               heterogenous          +----------+--------+--------+--------+------------+---------+ CCA Distal51      11              heterogenous          +----------+--------+--------+--------+------------+---------+ ICA Prox  66      25      1-39%   heterogenousShadowing +----------+--------+--------+--------+------------+---------+ ICA Distal59      30                                    +----------+--------+--------+--------+------------+---------+ ECA       466             >50%                          +----------+--------+--------+--------+------------+---------+ +----------+--------+-------+--------+-------------------+           PSV cm/sEDV cmsDescribeArm Pressure (mmHG) +----------+--------+-------+--------+-------------------+  [id number]                                         +----------+--------+-------+--------+-------------------+ +---------+--------+--+--------+-+---------+ VertebralPSV cm/s30EDV cm/s9Antegrade +---------+--------+--+--------+-+---------+  Left Carotid Findings: +----------+--------+--------+--------+------------+--------+           PSV cm/sEDV cm/sStenosisDescribe    Comments +----------+--------+--------+--------+------------+--------+ CCA Prox  70      19              heterogenous         +----------+--------+--------+--------+------------+--------+ CCA Distal52      16              heterogenous         +----------+--------+--------+--------+------------+--------+ ECA       392             >50%                         +----------+--------+--------+--------+------------+--------+ +----------+--------+--------+--------+-------------------+ SubclavianPSV cm/sEDV cm/sDescribeArm Pressure (mmHG) +----------+--------+--------+--------+-------------------+           85                                          +----------+--------+--------+--------+-------------------+ +---------+--------+--+--------+--+---------+ VertebralPSV cm/s93EDV cm/s25Antegrade +---------+--------+--+--------+--+---------+  Left Stent(s): +---------------+--+--+-------------+++ Prox to Stent  6416              +---------------+--+--+-------------+++ Proximal Stent 7215<50% stenosis +---------------+--+--+-------------+++ Mid Stent      7535              +---------------+--+--+-------------+++ Distal Stent   7837              +---------------+--+--+-------------+++ Distal to [id number]              +---------------+--+--+-------------+++  Summary: Right Carotid: Velocities in the right ICA are consistent with a 1-39% stenosis.                The ECA appears >50%  stenosed. Left Carotid: ICA stent <50% stenosis. The ECA appears >50% stenosed. Vertebrals:  Bilateral vertebral arteries demonstrate antegrade flow. *See table(s) above for measurements and observations.  Electronically signed by Harold Barban MD on 09/11/2018 at 10:07:51 AM.    Final       Subjective:  Patient seen and examined the bedside this morning.  Currently hemodynamically stable.  Comfortable.  Afebrile.  Stable  for discharge  Discharge Exam: Vitals:   09/13/18 0824 09/13/18 0835  BP: (!) 151/69   Pulse: 65   Resp: 18   Temp: 97.6 F (36.4 C)   SpO2: 94% 98%   Vitals:   09/12/18 2358 09/13/18 0449 09/13/18 0824 09/13/18 0835  BP: 124/68 137/67 (!) 151/69   Pulse: 64 (!) 58 65   Resp: 18 18 18    Temp: 97.8 F (36.6 C) 97.8 F (36.6 C) 97.6 F (36.4 C)   TempSrc: Oral Oral Axillary   SpO2: 96% 97% 94% 98%  Weight:      Height:        General: Pt is alert, awake, not in acute distress,obese Cardiovascular: RRR, S1/S2 +, no rubs, no gallops Respiratory: CTA bilaterally, no wheezing, no rhonchi Abdominal: Soft, NT, ND, bowel sounds + Extremities: no edema, no cyanosis    The results of significant diagnostics from this hospitalization (including imaging, microbiology, ancillary and laboratory) are listed below for reference.     Microbiology: Recent Results (from the past 240 hour(s))  SARS Coronavirus 2 [facility] order, Performed in [facility] hospital lab) Nasopharyngeal Urine, Clean Catch     Status: None   Collection Time: 09/09/18  3:40 PM   Specimen: Urine, Clean Catch; Nasopharyngeal  Result Value Ref Range Status   SARS Coronavirus 2 NEGATIVE NEGATIVE Final    Comment: (NOTE) If result is NEGATIVE SARS-CoV-2 target nucleic acids are NOT DETECTED. The SARS-CoV-2 RNA is generally detectable in upper and lower  respiratory specimens during the acute phase of infection. The lowest  concentration of SARS-CoV-2 viral copies this assay can detect is 250  copies / mL. A negative result does not preclude SARS-CoV-2 infection  and should not be  used as the sole basis for treatment or other  patient management decisions.  A negative result may occur with  improper specimen collection / handling, submission of specimen other  than nasopharyngeal swab, presence of viral mutation(s) within the  areas targeted by this assay, and inadequate number of viral copies  (<250 copies / mL). A negative result must be combined with clinical  observations, patient history, and epidemiological information. If result is POSITIVE SARS-CoV-2 target nucleic acids are DETECTED. The SARS-CoV-2 RNA is generally detectable in upper and lower  respiratory specimens dur ing the acute phase of infection.  Positive  results are indicative of active infection with SARS-CoV-2.  Clinical  correlation with patient history and other diagnostic information is  necessary to determine patient infection status.  Positive results do  not rule out bacterial infection or co-infection with other viruses. If result is PRESUMPTIVE POSTIVE SARS-CoV-2 nucleic acids MAY BE PRESENT.   A presumptive positive result was obtained on the submitted specimen  and confirmed on repeat testing.  While 2019 novel coronavirus  (SARS-CoV-2) nucleic acids may be present in the submitted sample  additional confirmatory testing may be necessary for epidemiological  and / or clinical management purposes  to differentiate between  SARS-CoV-2 and other Sarbecovirus currently known to infect humans.  If clinically indicated additional testing with an alternate  test  methodology [phone]) is advised. The SARS-CoV-2 RNA is generally  detectable in upper and lower respiratory sp ecimens during the acute  phase of infection. The expected result is Negative. Fact Sheet for Patients:  StrictlyIdeas.no Fact Sheet for Healthcare Providers: BankingDealers.co.za This test is not yet approved or cleared by the [place] FDA and has been authorized  for detection and/or diagnosis of SARS-CoV-2 by FDA under an Emergency Use Authorization (EUA).  This EUA will remain in effect (meaning this test can be used) for the duration of the COVID-19 declaration under Section 564(b)(1) of the Act, 21 U.S.C. section 360bbb-3(b)(1), unless the authorization is terminated or revoked sooner. Performed at [facility], [place] [address], [place], [place] [address]   Urine Culture     Status: None   Collection Time: 09/09/18  3:40 PM   Specimen: Urine, Clean Catch  Result Value Ref Range Status   Specimen Description URINE, CLEAN CATCH  Final   Special Requests NONE  Final   Culture   Final    NO GROWTH Performed at [facility], [place] [address] [place], [place] [address]    Report Status 09/10/2018 FINAL  Final  Blood culture (routine x 2)     Status: None (Preliminary result)   Collection Time: 09/09/18  6:50 PM   Specimen: BLOOD  Result Value Ref Range Status   Specimen Description BLOOD LEFT FINGER  Final   Special Requests   Final    BOTTLES DRAWN AEROBIC AND ANAEROBIC Blood Culture adequate volume   Culture   Final    NO GROWTH 3 DAYS Performed at [facility], [place] [address] [place], [place] [address]    Report Status PENDING  Incomplete  Blood culture (routine x 2)     Status: None (Preliminary result)   Collection Time: 09/09/18  6:52 PM   Specimen: BLOOD  Result Value Ref Range Status   Specimen Description BLOOD BLOOD LEFT WRIST  Final   Special Requests   Final    BOTTLES DRAWN AEROBIC AND ANAEROBIC Blood Culture adequate volume   Culture   Final    NO GROWTH 3 DAYS Performed at [facility], [place] [address] [place], [place] [address]    Report Status PENDING  Incomplete  Respiratory Panel by PCR     Status: None   Collection Time: 09/10/18 10:31 AM   Specimen: Nasopharyngeal Swab; Respiratory  Result Value Ref Range Status   Adenovirus NOT DETECTED NOT DETECTED Final   Coronavirus 229E NOT  DETECTED NOT DETECTED Final    Comment: (NOTE) The Coronavirus on the Respiratory Panel, DOES NOT test for the novel  Coronavirus (2019 nCoV)    Coronavirus HKU1 NOT DETECTED NOT DETECTED Final   Coronavirus NL63 NOT DETECTED NOT DETECTED Final   Coronavirus OC43 NOT DETECTED NOT DETECTED Final   Metapneumovirus NOT DETECTED NOT DETECTED Final   Rhinovirus / Enterovirus NOT DETECTED NOT DETECTED Final   Influenza A NOT DETECTED NOT DETECTED Final   Influenza B NOT DETECTED NOT DETECTED Final   Parainfluenza Virus 1 NOT DETECTED NOT DETECTED Final   Parainfluenza Virus 2 NOT DETECTED NOT DETECTED Final   Parainfluenza Virus 3 NOT DETECTED NOT DETECTED Final   Parainfluenza Virus 4 NOT DETECTED NOT DETECTED Final   Respiratory Syncytial Virus NOT DETECTED NOT DETECTED Final   Bordetella pertussis NOT DETECTED NOT DETECTED Final   Chlamydophila pneumoniae NOT DETECTED NOT DETECTED Final   Mycoplasma pneumoniae  NOT DETECTED NOT DETECTED Final    Comment: Performed at [facility], [place] [address] [place], [place] [address]  MRSA PCR Screening     Status: Abnormal   Collection Time: 09/10/18  2:10 PM   Specimen: Nasal Mucosa; Nasopharyngeal  Result Value Ref Range Status   MRSA by PCR POSITIVE (A) NEGATIVE Final    Comment:        The GeneXpert MRSA Assay (FDA approved for NASAL specimens only), is one component of a comprehensive MRSA colonization surveillance program. It is not intended to diagnose MRSA infection nor to guide or monitor treatment for MRSA infections. RESULT CALLED TO, READ BACK BY AND VERIFIED WITH: RN Evelene Croon [id number] MLM Performed at [place] [address] [place], [place] [address]      Labs: BNP (last 3 results) Recent Labs    03/25/18 1630 04/10/18 0512 09/10/18 0925  BNP 1,480.1* 149.9* >6,578.4*   Basic Metabolic Panel: Recent Labs  Lab 09/09/18 1440 09/10/18 0925 09/11/18 0647 09/12/18 0742 09/13/18 0317  NA 138  139 140 135 135  K 3.3* 3.2* 2.8* 3.3* 3.6  CL 99 94* 90* 83* 80*  CO2 25 29 38* 38* 42*  GLUCOSE 210* 306* 196* 227* 204*  BUN 11 12 22  26* 29*  CREATININE 1.13* 1.42* 1.41* 1.24* 1.52*  CALCIUM 9.3 9.0 8.6* 8.8* 9.2  MG  --  2.4 2.1  --   --    Liver Function Tests: Recent Labs  Lab 09/09/18 1440 09/10/18 0925  AST 18 17  ALT 15 15  ALKPHOS 94 86  BILITOT 1.8* 2.3*  PROT 6.7 6.7  ALBUMIN 3.4* 3.2*   No results for input(s): LIPASE, AMYLASE in the last 168 hours. No results for input(s): AMMONIA in the last 168 hours. CBC: Recent Labs  Lab 09/09/18 1440 09/10/18 0936 09/11/18 0647 09/12/18 0309 09/12/18 0742 09/13/18 0317  WBC 14.5* 22.1* 12.9* 15.0* 13.9* 10.9*  NEUTROABS 11.7*  --   --   --   --   --   HGB 11.4* 11.7* 10.8* 10.9* 10.7* 10.9*  HCT 36.8 37.6 35.3* 35.2* 34.5* 33.9*  MCV 92.2 91.9 93.1 92.1 91.5 89.0  PLT 237 233 170 192 181 191   Cardiac Enzymes: No results for input(s): CKTOTAL, CKMB, CKMBINDEX, TROPONINI in the last 168 hours. BNP: Invalid input(s): POCBNP CBG: Recent Labs  Lab 09/12/18 0612 09/12/18 1210 09/12/18 1705 09/12/18 2103 09/13/18 0617  GLUCAP 205* 246* 259* 255* 178*   D-Dimer No results for input(s): DDIMER in the last 72 hours. Hgb A1c No results for input(s): HGBA1C in the last 72 hours. Lipid Profile No results for input(s): CHOL, HDL, LDLCALC, TRIG, CHOLHDL, LDLDIRECT in the last 72 hours. Thyroid function studies No results for input(s): TSH, T4TOTAL, T3FREE, THYROIDAB in the last 72 hours.  Invalid input(s): FREET3 Anemia work up No results for input(s): VITAMINB12, FOLATE, FERRITIN, TIBC, IRON, RETICCTPCT in the last 72 hours. Urinalysis    Component Value Date/Time   COLORURINE YELLOW 09/09/2018 1542   APPEARANCEUR CLEAR 09/09/2018 1542   LABSPEC 1.008 09/09/2018 1542   PHURINE 5.0 09/09/2018 1542   GLUCOSEU NEGATIVE 09/09/2018 1542   HGBUR SMALL (A) 09/09/2018 1542   BILIRUBINUR NEGATIVE 09/09/2018  1542   KETONESUR NEGATIVE 09/09/2018 1542   PROTEINUR 100 (A) 09/09/2018 1542   UROBILINOGEN 1.0 04/16/2011 0954   NITRITE NEGATIVE 09/09/2018 1542   LEUKOCYTESUR NEGATIVE 09/09/2018 1542   Sepsis Labs Invalid input(s): PROCALCITONIN,  WBC,  [facility] Recent Results (from the past 240 hour(s))  SARS Coronavirus 2 [facility] order, Performed in [facility] hospital lab) Nasopharyngeal Urine, Clean Catch     Status: None   Collection Time: 09/09/18  3:40 PM   Specimen: Urine, Clean Catch; Nasopharyngeal  Result Value Ref Range Status   SARS Coronavirus 2 NEGATIVE NEGATIVE Final    Comment: (NOTE) If result is NEGATIVE SARS-CoV-2 target nucleic acids are NOT DETECTED. The SARS-CoV-2 RNA is generally detectable in upper and lower  respiratory specimens during the acute phase of infection. The lowest  concentration of SARS-CoV-2 viral copies this assay can detect is 250  copies / mL. A negative result does not preclude SARS-CoV-2 infection  and should not be used as the sole basis for treatment or other  patient management decisions.  A negative result may occur with  improper specimen collection / handling, submission of specimen other  than nasopharyngeal swab, presence of viral mutation(s) within the  areas targeted by this assay, and inadequate number of viral copies  (<250 copies / mL). A negative result must be combined with clinical  observations, patient history, and epidemiological information. If result is POSITIVE SARS-CoV-2 target nucleic acids are DETECTED. The SARS-CoV-2 RNA is generally detectable in upper and lower  respiratory specimens dur ing the acute phase of infection.  Positive  results are indicative of active infection with SARS-CoV-2.  Clinical  correlation with patient history and other diagnostic information is  necessary to determine patient infection status.  Positive results do  not rule out bacterial infection or co-infection with other  viruses. If result is PRESUMPTIVE POSTIVE SARS-CoV-2 nucleic acids MAY BE PRESENT.   A presumptive positive result was obtained on the submitted specimen  and confirmed on repeat testing.  While 2019 novel coronavirus  (SARS-CoV-2) nucleic acids may be present in the submitted sample  additional confirmatory testing may be necessary for epidemiological  and / or clinical management purposes  to differentiate between  SARS-CoV-2 and other Sarbecovirus currently known to infect humans.  If clinically indicated additional testing with an alternate test  methodology [phone]) is advised. The SARS-CoV-2 RNA is generally  detectable in upper and lower respiratory sp ecimens during the acute  phase of infection. The expected result is Negative. Fact Sheet for Patients:  StrictlyIdeas.no Fact Sheet for Healthcare Providers: BankingDealers.co.za This test is not yet approved or cleared by the [place] FDA and has been authorized for detection and/or diagnosis of SARS-CoV-2 by FDA under an Emergency Use Authorization (EUA).  This EUA will remain in effect (meaning this test can be used) for the duration of the COVID-19 declaration under Section 564(b)(1) of the Act, 21 U.S.C. section 360bbb-3(b)(1), unless the authorization is terminated or revoked sooner. Performed at [facility], [place] [address], [place], [place] [address]   Urine Culture     Status: None   Collection Time: 09/09/18  3:40 PM   Specimen: Urine, Clean Catch  Result Value Ref Range Status   Specimen Description URINE, CLEAN CATCH  Final   Special Requests NONE  Final   Culture   Final    NO GROWTH Performed at [facility], [place] [address] [place], [place] [address]    Report Status 09/10/2018 FINAL  Final  Blood culture (routine x 2)     Status: None (Preliminary result)   Collection Time: 09/09/18  6:50 PM   Specimen: BLOOD  Result Value Ref Range Status  Specimen Description BLOOD LEFT FINGER  Final   Special Requests   Final    BOTTLES DRAWN AEROBIC AND ANAEROBIC Blood Culture adequate volume   Culture   Final    NO GROWTH 3 DAYS Performed at [facility], [address] [place], [place] [address]    Report Status PENDING  Incomplete  Blood culture (routine x 2)     Status: None (Preliminary result)   Collection Time: 09/09/18  6:52 PM   Specimen: BLOOD  Result Value Ref Range Status   Specimen Description BLOOD BLOOD LEFT WRIST  Final   Special Requests   Final    BOTTLES DRAWN AEROBIC AND ANAEROBIC Blood Culture adequate volume   Culture   Final    NO GROWTH 3 DAYS Performed at [facility], [place] [address], [place], [place] [address]    Report Status PENDING  Incomplete  Respiratory Panel by PCR     Status: None   Collection Time: 09/10/18 10:31 AM   Specimen: Nasopharyngeal Swab; Respiratory  Result Value Ref Range Status   Adenovirus NOT DETECTED NOT DETECTED Final   Coronavirus 229E NOT DETECTED NOT DETECTED Final    Comment: (NOTE) The Coronavirus on the Respiratory Panel, DOES NOT test for the novel  Coronavirus (2019 nCoV)    Coronavirus HKU1 NOT DETECTED NOT DETECTED Final   Coronavirus NL63 NOT DETECTED NOT DETECTED Final   Coronavirus OC43 NOT DETECTED NOT DETECTED Final   Metapneumovirus NOT DETECTED NOT DETECTED Final   Rhinovirus / Enterovirus NOT DETECTED NOT DETECTED Final   Influenza A NOT DETECTED NOT DETECTED Final   Influenza B NOT DETECTED NOT DETECTED Final   Parainfluenza Virus 1 NOT DETECTED NOT DETECTED Final   Parainfluenza Virus 2 NOT DETECTED NOT DETECTED Final   Parainfluenza Virus 3 NOT DETECTED NOT DETECTED Final   Parainfluenza Virus 4 NOT DETECTED NOT DETECTED Final   Respiratory Syncytial Virus NOT DETECTED NOT DETECTED Final   Bordetella pertussis NOT DETECTED NOT DETECTED Final   Chlamydophila pneumoniae NOT DETECTED NOT DETECTED Final   Mycoplasma pneumoniae NOT  DETECTED NOT DETECTED Final    Comment: Performed at [facility], [place]. [address] [place], [place] [address]  MRSA PCR Screening     Status: Abnormal   Collection Time: 09/10/18  2:10 PM   Specimen: Nasal Mucosa; Nasopharyngeal  Result Value Ref Range Status   MRSA by PCR POSITIVE (A) NEGATIVE Final    Comment:        The GeneXpert MRSA Assay (FDA approved for NASAL specimens only), is one component of a comprehensive MRSA colonization surveillance program. It is not intended to diagnose MRSA infection nor to guide or monitor treatment for MRSA infections. RESULT CALLED TO, READ BACK BY AND VERIFIED WITH: RN Evelene Croon [phone] [id number] Performed at [place] [address], [place],  [address]     Please note: You were cared for by a hospitalist during your hospital stay. Once you are discharged, your primary care physician will handle any further medical issues. Please note that NO REFILLS for any discharge medications will be authorized once you are discharged, as it is imperative that you return to your primary care physician (or establish a relationship with a primary care physician if you do not have one) for your post hospital discharge needs so that they can reassess your need for medications and monitor your lab values.    Time coordinating discharge: 40 minutes  SIGNED:   Shelly Coss, MD  [facility] 09/13/2018, 11:09 AM Pager [phone]  If 7PM-7AM, please contact night-coverage [email or web address] Password [email or web address]

## 2018-09-13 NOTE — H&P (Signed)
Physical Medicine and Rehabilitation Admission H&P    Chief Complaint  Patient presents with   Dizziness   Fall   Shortness of Breath  : HPI: [name] [name] is a 73 year old right-handed female history of hypertension, chronic back pain maintained on oxycodone as needed type II diabetes mellitus, hyperlipidemia, asthma using oxygen as needed at night, chronic diastolic congestive heart failure with ejection fraction 25%, CVA, CAD with CABG, atrial fibrillation maintained on Eliquis as well as Plavix followed by Dr. Hildred Priest, CKD stage III, OSA not on CPAP.  Per chart review patient lives with spouse.  Used a rolling walker for mobility with reports of multiple falls.  Husband daughter and family assist as needed.  Patient had been a skilled nursing facility up until end of July for bouts of pneumonia.  2 level home with bed and bathroom upstairs.  Presented 09/09/2018 with transient slurred speech, dizziness, vomiting as well as fall x3 without loss of consciousness.  Noted bruises to her left upper back and left leg.  WBC 14,500, creatinine 1.13, lactic acid 1.2, COVID negative, urinalysis negative, potassium 3.3, low-grade fever 100.9, blood pressure 177/71.  Chest x-ray showed cardiomegaly and vascular congestion with mild pulmonary edema as well as developing bronchopneumonia right lower lobe.  MRSA PCR screening positive.  X-rays of left femur and left rib negative for fracture.  CT of the head as well as MRI/MRA showed no acute ischemic infarct or intracranial hemorrhage.  Severe intracranial atherosclerosis noted.  Severe stenosis of left PCA P1 and P2 segments unchanged from compared to 06/01/2014.  Severe stenosis of both internal carotid arteries at the skull base progressed compared to 06/01/2014.  Neurology follow-up to address MRI suspect subacute to chronic right frontal lobe infarct new from prior scan of February 2020.  It was advised to continue Eliquis and Plavix as prior to  admission.  Patient maintained on vancomycin and cefepime with fevers and leukocytosis improved and changed to Levaquin 812 20203 doses.  She did receive low-dose diuresis monitoring for any signs of hypoxia.  Patient is tolerating a regular consistency diet.  Therapy evaluations completed and patient was admitted for a comprehensive rehab program.  Pt is currently using a Purewick- an external female catheter- which is working well.  She was also receiving a cream on her L forearm/upper arm that was helpful for what appears to be psoriasis (was told it was eczema- very painful/burning/itching).  Her CO2 up to 42- likely from Lasix and pt isn't usually on O2 during day, however hasn't tolerated a wean from daytime O2 as of yet- was very SOB yesterday when tried.  LBm yesterday.  Has a lot of bruising all over from multiple falls, and when she has another "small stroke" she gets confused and speech gets halting/confused/partially aphasic.  Doesn't use CPAP for OSA.   Review of Systems  Constitutional: Positive for fever and malaise/fatigue.  HENT: Negative for hearing loss.   Eyes: Negative for blurred vision and double vision.  Respiratory: Positive for shortness of breath. Negative for cough and wheezing.   Cardiovascular: Positive for palpitations and leg swelling. Negative for chest pain.  Gastrointestinal: Positive for constipation. Negative for heartburn, nausea and vomiting.       GERD  Genitourinary: Negative for dysuria, flank pain and hematuria.  Musculoskeletal: Positive for back pain, falls, joint pain and myalgias.  Skin: Negative for rash.  Psychiatric/Behavioral: Positive for depression. The patient has insomnia.        Anxiety  All other systems reviewed and are negative.  Past Medical History:  Diagnosis Date   Abnormality of gait 05/14/2014   Anemia    hx   Anginal pain (HCC)    Anxiety    Asthma    Basal cell carcinoma 05/2014   "left shoulder"   Bundle  branch block, left    chronic/notes 07/18/2013   CHF (congestive heart failure) ([place])    Chronic bronchitis ([place])    "off and on all the time" (07/18/2013)   Chronic insomnia 05/06/2015   Chronic kidney disease    frequency, sees dr Jamal Maes every 4 to 6 months (01/16/2018)   Chronic low back pain 08/24/2016   Chronic lower back pain    Claustrophobia    Common migraine 05/14/2014   Coronary artery disease    Depression    Dysrhythmia    GERD (gastroesophageal reflux disease)    H/O hiatal hernia    Headache    "at least 2/month" (01/16/2018)   Heart murmur    Hyperlipidemia    Hypertension    Irregular heart beat    Leg cramps    both legs at times   Melanoma ([place]) 05/2014   "burned off BLE" (01/16/2018)   Memory change 05/14/2014   Migraine    "5-6/year"  (01/16/2018)   Myocardial infarction ([place]) 04/1999, 02/2000, 01/2005; 2011; 2014   Neuromuscular disorder ([place])    ?   Obesity 01-2010   Obstructive sleep apnea    "can't wear machine; I have claustrophobia" (01/16/2018), states she had a 2nd sleep study and does not have sleep apnea, her O2 decreases and now is on 2 L of O2 at night.   On home oxygen therapy    "2L at night and prn during daytime" (01/16/2018)   Osteoarthritis    "knees and hands" (01/16/2018)   Other and unspecified angina pectoris    Peripheral vascular disease ([place])    ? numbness, tingling arms and legs   Pneumonia 2000's   "once"   PONV (postoperative nausea and vomiting)    Shortness of breath    with exertion   Stroke ([place]) 03/22/12   right side brain; denies residual on 07/18/2013)   Stroke [facility]) Oct. 2015   Affected pt.s balance   Stroke ([place]) 10/2014   "affected my legs; fully recovered now"; still have sporatic memory issues from this one" (01/16/2018)   Type II diabetes mellitus (HCC)    Ventral hernia    hx of   Vomiting    persistent   Vomiting blood    Past Surgical History:  Procedure  Laterality Date   ANKLE FRACTURE SURGERY Left 1970's   APPENDECTOMY  1970's   w/hysterectomy   BASAL CELL CARCINOMA EXCISION Left 05/2014   "shoulder" (01/16/2018)   CARDIAC CATHETERIZATION  10/10/2012   Dr [name].   CARDIAC CATHETERIZATION N/A 05/29/2015   Procedure: Right/Left Heart Cath and Coronary/Graft Angiography;  Surgeon: Larey Dresser, MD;  Location: [facility];  Service: Cardiovascular;  Laterality: N/A;   CAROTID ENDARTERECTOMY Left 03/2013   CAROTID STENT INSERTION Left 03/20/2013   Procedure: CAROTID STENT INSERTION;  Surgeon: [name], MD;  Location: [facility] CATH LAB;  Service: Cardiovascular;  Laterality: Left;  internal carotid   CEREBRAL ANGIOGRAM N/A 04/05/2011   Procedure: CEREBRAL ANGIOGRAM;  Surgeon: Angelia Mould, MD;  Location: [facility] CATH LAB;  Service: Cardiovascular;  Laterality: N/A;   CHOLECYSTECTOMY OPEN  2004   CORONARY ANGIOPLASTY WITH STENT PLACEMENT  01,02,05,06,07,08,11; 04/24/2013   "I've probably got ~ 10 stents by now" (04/24/2013)   CORONARY ANGIOPLASTY WITH STENT PLACEMENT  06/13/2013   "got 4 stents today" (06/13/2013)   CORONARY ARTERY BYPASS GRAFT  1220/11   "CABG X5"   CORONARY STENT INTERVENTION N/A 10/20/2017   Procedure: CORONARY STENT INTERVENTION;  Surgeon: Troy Sine, MD;  Location: [facility];  Service: Cardiovascular;  Laterality: N/A;   ESOPHAGOGASTRODUODENOSCOPY  08/03/2011   Procedure: ESOPHAGOGASTRODUODENOSCOPY (EGD);  Surgeon: Shann Medal, MD;  Location: [facility];  Service: General;  Laterality: N/A;   ESOPHAGOGASTRODUODENOSCOPY (EGD) WITH PROPOFOL N/A 03/11/2014   Procedure: ESOPHAGOGASTRODUODENOSCOPY (EGD) WITH PROPOFOL;  Surgeon: Lafayette Dragon, MD;  Location: [facility];  Service: Endoscopy;  Laterality: N/A;   FRACTURE SURGERY     gall stone removal  05/2003   GASTRIC RESTRICTION SURGERY  1984   "stapeling"   HERNIA REPAIR  2004   "in my stomach; had OR on it twice", wire mesh on 1  hernia   LEFT HEART CATH AND CORS/GRAFTS ANGIOGRAPHY N/A 07/09/2016   Procedure: LEFT HEART CATH AND CORS/GRAFTS ANGIOGRAPHY;  Surgeon: Larey Dresser, MD;  Location: [facility];  Service: Cardiovascular;  Laterality: N/A;   ORIF ANKLE FRACTURE Left 05/16/2018   ORIF ANKLE FRACTURE Left 05/16/2018   Procedure: OPEN REDUCTION INTERNAL FIXATION (ORIF) Left ankle with possible syndesmosis fixation;  Surgeon: Nicholes Stairs, MD;  Location: [facility];  Service: Orthopedics;  Laterality: Left;  144min   OVARY SURGERY  1970's   "tumor removed"   PERCUTANEOUS CORONARY STENT INTERVENTION (PCI-S) N/A 06/13/2013   Procedure: PERCUTANEOUS CORONARY STENT INTERVENTION (PCI-S);  Surgeon: [name], MD;  Location: [facility] CATH LAB;  Service: Cardiovascular;  Laterality: N/A;   PERCUTANEOUS STENT INTERVENTION N/A 04/24/2013   Procedure: PERCUTANEOUS STENT INTERVENTION;  Surgeon: [name], MD;  Location: [facility] CATH LAB;  Service: Cardiovascular;  Laterality: N/A;   RIGHT/LEFT HEART CATH AND CORONARY/GRAFT ANGIOGRAPHY N/A 10/20/2017   Procedure: RIGHT/LEFT HEART CATH AND CORONARY/GRAFT ANGIOGRAPHY;  Surgeon: Larey Dresser, MD;  Location: [facility];  Service: Cardiovascular;  Laterality: N/A;   ROOT CANAL  10/2000   TIBIA FRACTURE SURGERY Right 1970's   rods and pins   TOOTH EXTRACTION     "1 on the upper; wisdom tooth on the lower" (01/16/2018)   TOTAL ABDOMINAL HYSTERECTOMY  1970's   w/ appendectomy   Family History  Problem Relation Age of Onset   Heart disease Father        Heart Disease before age 29   Diabetes Father    Hyperlipidemia Father    Hypertension Father    Heart attack Father    Deep vein thrombosis Father    AAA (abdominal aortic aneurysm) Father    Hypertension Mother    Deep vein thrombosis Mother    AAA (abdominal aortic aneurysm) Mother    Cancer Sister        Ovarian   Hypertension Sister    Diabetes Paternal Grandmother     Heart disease Paternal Uncle    Diabetes Paternal Uncle    Heart attack Paternal Grandfather 68       died of MI at 80   Diabetes Paternal Aunt    Diabetes Paternal Uncle    Colon cancer Neg Hx    Social History:  reports that she has never smoked. She has never used smokeless tobacco. She reports that she does not drink alcohol or use drugs.  Allergies:  Allergies  Allergen Reactions   Amoxicillin Shortness Of Breath and Rash   Brilinta [Ticagrelor] Shortness Of Breath   Erythromycin Shortness Of Breath, Other (See Comments) and Hives    Trouble swallowing   Flagyl [Metronidazole] Shortness Of Breath and Palpitations   Penicillins Hives, Shortness Of Breath, Rash and Other (See Comments)    Has patient had a PCN reaction causing immediate rash, facial/tongue/throat swelling, SOB or lightheadedness with hypotension: Yes Has patient had a PCN reaction causing severe rash involving mucus membranes or skin necrosis: No Has patient had a PCN reaction that required hospitalization: Yes Has patient had a PCN reaction occurring within the last 10 years: No If all of the above answers are "NO", then may proceed with Cephalosporin use.    Isosorbide Mononitrate [Isosorbide Nitrate] Other (See Comments)    Joint aches, muscles hurt, difficult to walk    Jardiance [Empagliflozin] Other (See Comments)    Nausea, joint aches, muscles aches   Metformin And Related Other (See Comments)    Stomach pain, cold sweats, joint pain, burred vision, dizziness   Tape Other (See Comments)    Skin pulls off with certain types Plastic tape causes skin to rip if left on for long periods of time   Erythromycin Base Rash   Medications Prior to Admission  Medication Sig Dispense Refill   ADVAIR HFA 115-21 MCG/ACT inhaler Inhale 2 puffs into the lungs.     albuterol (VENTOLIN HFA) 108 (90 Base) MCG/ACT inhaler Inhale 2 puffs into the lungs every 6 (six) hours as needed for wheezing.      Alirocumab (PRALUENT) 150 MG/ML SOAJ Inject 1 pen into the skin every 14 (fourteen) days. 2 pen 11   ALPRAZolam (XANAX) 0.5 MG tablet Take 1 tablet (0.5 mg total) by mouth 2 (two) times daily. 60 tablet 0   amLODipine (NORVASC) 10 MG tablet Take 10 mg by mouth daily.     apixaban (ELIQUIS) 5 MG TABS tablet Take 1 tablet (5 mg total) by mouth 2 (two) times daily. 60 tablet 6   bisacodyl (DULCOLAX) 5 MG EC tablet Take 1 tablet (5 mg total) by mouth daily as needed for moderate constipation. 30 tablet 1   budesonide (PULMICORT) 0.25 MG/2ML nebulizer solution Take 2 mLs (0.25 mg total) by nebulization 2 (two) times daily. 60 mL 12   buPROPion (WELLBUTRIN XL) 150 MG 24 hr tablet Take 150 mg by mouth daily.     clopidogrel (PLAVIX) 75 MG tablet Take 1 tablet (75 mg total) by mouth at bedtime. 90 tablet 1   denosumab (PROLIA) 60 MG/ML SOSY injection Inject 69 mg into the skin every 6 (six) months.     DULoxetine (CYMBALTA) 30 MG capsule Take 1 capsule (30 mg total) by mouth daily.  3   ezetimibe (ZETIA) 10 MG tablet Take 1 tablet (10 mg total) by mouth at bedtime.     gabapentin (NEURONTIN) 300 MG capsule Take 2 capsules (600 mg total) by mouth 2 (two) times daily.     insulin aspart (NOVOLOG) 100 UNIT/ML injection Inject 8 Units into the skin 3 (three) times daily with meals. (Patient taking differently: Inject 20 Units into the skin 3 (three) times daily. ) 10 mL 11   insulin glargine (LANTUS) 100 UNIT/ML injection Inject 0.35 mLs (35 Units total) into the skin at bedtime. (Patient taking differently: Inject 38 Units into the skin at bedtime. ) 10 mL 11   ipratropium-albuterol (DUONEB) 0.5-2.5 (3) MG/3ML SOLN Take 3 mLs  by nebulization 2 (two) times daily. (Patient taking differently: Take 3 mLs by nebulization 2 (two) times daily as needed (shortness of breath). ) 360 mL    metolazone (ZAROXOLYN) 2.5 MG tablet Take 2.5 mg by mouth daily.     Multiple Vitamins-Minerals (HEALTHY  EYES/LUTEIN) TABS Take 1 tablet by mouth daily.     nitroGLYCERIN (NITROSTAT) 0.3 MG SL tablet Place 1 tablet (0.3 mg total) under the tongue every 5 (five) minutes x 3 doses as needed for chest pain. 9 tablet 1   ondansetron (ZOFRAN) 4 MG tablet Take 4 mg by mouth every 6 (six) hours as needed for nausea/vomiting.     pantoprazole (PROTONIX) 40 MG tablet Take 1 tablet (40 mg total) by mouth daily.     ranolazine (RANEXA) 1000 MG SR tablet Take 1,000 mg by mouth every 12 (twelve) hours.     rosuvastatin (CRESTOR) 40 MG tablet Take 1 tablet (40 mg total) by mouth at bedtime.     torsemide (DEMADEX) 20 MG tablet Take 1 tablet (20 mg total) by mouth every other day.     triamcinolone cream (KENALOG) 0.1 % Apply 1 application topically daily.     vitamin B-12 (CYANOCOBALAMIN) 1000 MCG tablet Take 1,000 mcg by mouth daily.     zolpidem (AMBIEN) 10 MG tablet Take 10 mg by mouth every evening.     metoprolol succinate (TOPROL-XL) 50 MG 24 hr tablet Take 25mg  daily with or immediately following a meal.     oxyCODONE (OXY IR/ROXICODONE) 5 MG immediate release tablet Take 1 tablet (5 mg total) by mouth every 4 (four) hours as needed for moderate pain or severe pain. (Patient not taking: Reported on 09/09/2018) 40 tablet 0   OXYGEN Inhale 2 L into the lungs at bedtime.      spironolactone (ALDACTONE) 25 MG tablet Take 1 tablet (25 mg total) by mouth daily. (Patient not taking: Reported on 09/09/2018)     traMADol (ULTRAM) 50 MG tablet Take 1 tablet (50 mg total) by mouth every 6 (six) hours as needed for moderate pain. (Patient not taking: Reported on 09/09/2018) 40 tablet 0    Drug Regimen Review Drug regimen was reviewed and remains appropriate with no significant issues identified  Home: Home Living Family/patient expects to be discharged to:: Private residence Living Arrangements: Spouse/significant other Available Help at Discharge: Family, Available 24 hours/day Type of Home: House Home  Access: Level entry Home Layout: Two level, Bed/bath upstairs Alternate Level Stairs-Number of Steps: has a stair lift Bathroom Shower/Tub: Multimedia programmer: Standard Bathroom Accessibility: No Home Equipment: Environmental consultant - 4 wheels, Shower seat  Lives With: Spouse  also going home with spouse/daughter- Alice Functional History: Prior Function Level of Independence: Needs assistance Gait / Transfers Assistance Needed: Uses rollator for ambuation ADL's / Homemaking Assistance Needed: husband dries her back side and supervises shower transfer, pt can dress, husband does cooking and pt has a housekeeper Comments: Has been doing [place] services. reports her balance is no great and she falls a lot.  Functional Status:  Mobility: Bed Mobility Overal bed mobility: Needs Assistance Bed Mobility: Supine to Sit Supine to sit: Min assist Sit to supine: Mod assist General bed mobility comments: use of rail and increased time and effort needed; assist to bring hips to EOB with use of bed pad Transfers Overall transfer level: Needs assistance Equipment used: Rolling walker (2 wheeled) Transfers: Sit to/from Stand Sit to Stand: Min guard Stand pivot transfers: Mod assist General transfer comment:  min guard for safety from EOB and recliner; cues for safe hand placement Ambulation/Gait Ambulation/Gait assistance: Min assist, Mod assist Gait Distance (Feet): (60 ft then ~6 ft) Assistive device: Rolling walker (2 wheeled) Gait Pattern/deviations: Step-through pattern, Step-to pattern, Decreased stride length General Gait Details: pt with decreased cadence and stride length; initial gait bout pt demonstrates R LE weakness and near buckling and after seate rest break pt agreeable to attempt further ambulation however bilat LE weakness was significant and tolerated only ~6 ft; chair follow for safety  Gait velocity: decreased    ADL: ADL Overall ADL's : Needs  assistance/impaired Eating/Feeding: Independent, Sitting Grooming: Set up, Sitting Upper Body Bathing: Minimal assistance, Sitting Lower Body Bathing: Minimal assistance, Sit to/from stand Upper Body Dressing : Minimal assistance, Sitting Lower Body Dressing: Minimal assistance, Sit to/from stand Toilet Transfer: Minimal assistance, Ambulation, RW Functional mobility during ADLs: Minimal assistance  Cognition: Cognition Overall Cognitive Status: Within Functional Limits for tasks assessed Arousal/Alertness: Awake/alert Orientation Level: Oriented X4 Memory: Impaired Memory Impairment: Retrieval deficit, Decreased short term memory, Other (comment)(baseline deficits) Awareness: Appears intact Problem Solving: Appears intact Safety/Judgment: Appears intact Cognition Arousal/Alertness: Awake/alert Behavior During Therapy: WFL for tasks assessed/performed Overall Cognitive Status: Within Functional Limits for tasks assessed  Physical Exam: Blood pressure (!) 151/69, pulse 65, temperature 97.6 F (36.4 C), temperature source Axillary, resp. rate 18, height 5' 2.5" (1.588 m), weight 116.2 kg, SpO2 98 %. Physical Exam  Constitutional: She is oriented to person, place, and time. She appears well-developed and well-nourished.  Obese, but appears to be more in trunk/proximal limbs than distal limbs Slightly halting speech- can understand, but a little slow to process/react to questions   HENT:  Head: Normocephalic and atraumatic.  Nose: Nose normal.  Mouth/Throat: Oropharynx is clear and moist.  Eyes: Pupils are equal, round, and reactive to light. EOM are normal. Right eye exhibits no discharge. Left eye exhibits no discharge. No scleral icterus.  Wearing eyeglasses  Neck: Normal range of motion. Neck supple. No tracheal deviation present.  Cardiovascular: Normal rate and normal heart sounds. Exam reveals no gallop and no friction rub.  No murmur heard. Sounds RRR at this moment in  spite of intermittent A Fib- no M/R/G  Respiratory: Breath sounds normal. No respiratory distress. She has no wheezes. She has no rales. She exhibits no tenderness.  Decreased air movement B/L R>L esp at bases, but diffuse overall.  GI: Soft. Bowel sounds are normal. She exhibits no distension. There is no abdominal tenderness.  Large bruise on R mid abdomen- purple/yellow LBM yesterday Soft, NT, ND, protuberant/obese  Genitourinary:    Genitourinary Comments: Has pure-wick external catheter   Musculoskeletal:        General: Edema (legs swollen/edematous above feet) present.     Comments: UEs- deltoids 4+/5, otherwise 5-/5 distally B/L  LEs HF ~3/5 in HFs B/L Distally 4 to 4+/5 in LEs B/L Decreased ROM of L ankle  Neurological: She is alert and oriented to person, place, and time.  Patient is alert makes good eye contact with examiner.  Follows full commands.  She is alert and oriented to person, place and time.  [place]. Halting speech Smile equal, but mouth wider/droopy on R at rest EOMI B/L No facial sensory changes or motor changes otherwise  Skin: Skin is warm. Rash (has psoriatic type rash above elbow on LUE) noted.  Extremely large bruise on L inner thigh and large almost hematoma on R upper back from fall- actually concave from  back. Purple- no drainage  Psychiatric: She has a normal mood and affect. Her behavior is normal. Judgment and thought content normal.    Results for orders placed or performed during the hospital encounter of 09/09/18 (from the past 48 hour(s))  Glucose, capillary     Status: Abnormal   Collection Time: 09/11/18 11:03 AM  Result Value Ref Range   Glucose-Capillary 289 (H) 70 - 99 mg/dL   Comment 1 Notify RN    Comment 2 Document in Chart   Glucose, capillary     Status: Abnormal   Collection Time: 09/11/18  3:38 PM  Result Value Ref Range   Glucose-Capillary 343 (H) 70 - 99 mg/dL   Comment 1 Notify RN    Comment 2 Document in  Chart   Glucose, capillary     Status: Abnormal   Collection Time: 09/11/18  9:10 PM  Result Value Ref Range   Glucose-Capillary 286 (H) 70 - 99 mg/dL   Comment 1 Notify RN    Comment 2 Document in Chart   CBC     Status: Abnormal   Collection Time: 09/12/18  3:09 AM  Result Value Ref Range   WBC 15.0 (H) 4.0 - 10.5 K/uL   RBC 3.82 (L) 3.87 - 5.11 MIL/uL   Hemoglobin 10.9 (L) 12.0 - 15.0 g/dL   HCT 35.2 (L) 36.0 - 46.0 %   MCV 92.1 80.0 - 100.0 fL   MCH 28.5 26.0 - 34.0 pg   MCHC 31.0 30.0 - 36.0 g/dL   RDW 14.8 11.5 - 15.5 %   Platelets 192 150 - 400 K/uL   nRBC 0.0 0.0 - 0.2 %    Comment: Performed at [facility], [address] [place], [place] [address]  Glucose, capillary     Status: Abnormal   Collection Time: 09/12/18  6:12 AM  Result Value Ref Range   Glucose-Capillary 205 (H) 70 - 99 mg/dL   Comment 1 Notify RN    Comment 2 Document in Chart   Basic metabolic panel     Status: Abnormal   Collection Time: 09/12/18  7:42 AM  Result Value Ref Range   Sodium 135 135 - 145 mmol/L   Potassium 3.3 (L) 3.5 - 5.1 mmol/L   Chloride 83 (L) 98 - 111 mmol/L   CO2 38 (H) 22 - 32 mmol/L   Glucose, Bld 227 (H) 70 - 99 mg/dL   BUN 26 (H) 8 - 23 mg/dL   Creatinine, Ser 1.24 (H) 0.44 - 1.00 mg/dL   Calcium 8.8 (L) 8.9 - 10.3 mg/dL   GFR calc non Af Amer 44 (L) >60 mL/min   GFR calc Af Amer 51 (L) >60 mL/min   Anion gap 14 5 - 15    Comment: Performed at [facility] [address], [place], [place] [address]  CBC     Status: Abnormal   Collection Time: 09/12/18  7:42 AM  Result Value Ref Range   WBC 13.9 (H) 4.0 - 10.5 K/uL   RBC 3.77 (L) 3.87 - 5.11 MIL/uL   Hemoglobin 10.7 (L) 12.0 - 15.0 g/dL   HCT 34.5 (L) 36.0 - 46.0 %   MCV 91.5 80.0 - 100.0 fL   MCH 28.4 26.0 - 34.0 pg   MCHC 31.0 30.0 - 36.0 g/dL   RDW 14.8 11.5 - 15.5 %   Platelets 181 150 - 400 K/uL   nRBC 0.0 0.0 - 0.2 %    Comment: Performed  at [facility], [place] [address],  [place], [place] [address]  Glucose, capillary     Status: Abnormal   Collection Time: 09/12/18 12:10 PM  Result Value Ref Range   Glucose-Capillary 246 (H) 70 - 99 mg/dL  Glucose, capillary     Status: Abnormal   Collection Time: 09/12/18  5:05 PM  Result Value Ref Range   Glucose-Capillary 259 (H) 70 - 99 mg/dL   Comment 1 Notify RN    Comment 2 Document in Chart   Glucose, capillary     Status: Abnormal   Collection Time: 09/12/18  9:03 PM  Result Value Ref Range   Glucose-Capillary 255 (H) 70 - 99 mg/dL  Basic metabolic panel     Status: Abnormal   Collection Time: 09/13/18  3:17 AM  Result Value Ref Range   Sodium 135 135 - 145 mmol/L   Potassium 3.6 3.5 - 5.1 mmol/L    Comment: SLIGHT HEMOLYSIS   Chloride 80 (L) 98 - 111 mmol/L   CO2 42 (H) 22 - 32 mmol/L   Glucose, Bld 204 (H) 70 - 99 mg/dL   BUN 29 (H) 8 - 23 mg/dL   Creatinine, Ser 1.52 (H) 0.44 - 1.00 mg/dL   Calcium 9.2 8.9 - 10.3 mg/dL   GFR calc non Af Amer 34 (L) >60 mL/min   GFR calc Af Amer 40 (L) >60 mL/min   Anion gap 13 5 - 15    Comment: Performed at [facility], [place] [address] [place], [place] [address]  CBC     Status: Abnormal   Collection Time: 09/13/18  3:17 AM  Result Value Ref Range   WBC 10.9 (H) 4.0 - 10.5 K/uL   RBC 3.81 (L) 3.87 - 5.11 MIL/uL   Hemoglobin 10.9 (L) 12.0 - 15.0 g/dL   HCT 33.9 (L) 36.0 - 46.0 %   MCV 89.0 80.0 - 100.0 fL   MCH 28.6 26.0 - 34.0 pg   MCHC 32.2 30.0 - 36.0 g/dL   RDW 14.7 11.5 - 15.5 %   Platelets 191 150 - 400 K/uL   nRBC 0.0 0.0 - 0.2 %    Comment: Performed at [facility], [place] [address], [place], [place] [address]  Glucose, capillary     Status: Abnormal   Collection Time: 09/13/18  6:17 AM  Result Value Ref Range   Glucose-Capillary 178 (H) 70 - 99 mg/dL   *Note: Due to a large number of results and/or encounters for the requested time period, some results have not been displayed. A complete set of results can be found in Results Review.   No  results found.     Medical Problem List and Plan: 1.  Decreased functional mobility secondary to subacute to chronic right frontal lobe infarction/acute respiratory failure with sepsis/lobar pneumonia 2.  Antithrombotics: -DVT/anticoagulation: Continue Eliquis  -antiplatelet therapy: Continue Plavix 75 mg daily 3. Pain Management: Neurontin 600 mg twice daily, continue oxycodone 5 mg every 6 hours as needed moderate to severe pain/chronic back pain 4. Mood: Cymbalta 30 mg daily, Xanax 0.5 mg twice daily, Wellbutrin 150 mg daily.  Provide emotional support  -antipsychotic agents: N/A 5. Neuropsych: This patient is capable of making decisions on her own behalf. 6. Skin/Wound Care: Routine skin checks 7. Fluids/Electrolytes/Nutrition: Routine in and outs with follow-up chemistries 8.  Diabetes mellitus.  Hemoglobin A1c 7.0.  NovoLog 4 units 3 times daily, Lantus insulin 28 units nightly. 9.  ID /MRSA PCR  screening positive.  Contact precautions.  Levaquin 500 mg daily x3 doses for pneumonia 10.  CKD stage III.  Admission creatinine 1.13 with baseline 1.39-1.95. 11.  Asthma/OSA.  Patient not on CPAP- refuses.  Continue Dulera as well as nebulizer.  Check oxygen saturations every shift as patient was using oxygen at night as needed prior to admission 12.  Hypertension.  Toprol-XL 50 mg daily, Ranexa 1000 mg every 12 hours Norvasc 10 mg daily 13.  Hypokalemia.  Follow-up chemistries- currently 3.8 14.  CAD with CABG.  Continue Plavix.  No increasing chest pain 15.  Diastolic congestive heart failure.  Monitor for any signs of fluid overload..  Continue Zaroxolyn 2.5 mg daily as well as fluid restriction- due to Lasix dosing, likely, pt's CO2 level up to 42- will con't to monitor her CO2 levels on BMP and treat as necessary. 16.  Hyperlipidemia.  Zetia/Crestor 17. Urinary issues- pt would like to try/keep purewick external catheter if possible- will check 18. Psoriasis? Vs eczema- can con't  current steroid cream or try Betamethasone cream BID on LUE to help with rash. 19. Dispo- d/c home with husband/daughter  Cathlyn Parsons, PA-C 09/13/2018

## 2018-09-13 NOTE — Progress Notes (Signed)
[name], MD  Physician  Physical Medicine and Rehabilitation  PMR Pre-admission  Signed  Date of Service:  09/13/2018 11:23 AM      Related encounter: ED to Hosp-Admission (Current) from 09/09/2018 in [facility]      Signed        PMR Admission Coordinator Pre-Admission Assessment   Patient: [name] [name] is an 73 y.o., female [name]: [id number] DOB: March 05, 1945 Height: 5' 2.5" (158.8 cm) Weight: 116.2 kg   Insurance Information HMO:     PPO:      PCP:      IPA:      80/20:      OTHER: no medicare advantage plan PRIMARY: Medicare a and b      Policy#: [id number]      Subscriber: pt Benefits:  Phone #: passport one online     Name: 09/13/2018 Eff. Date: 12/02/2012     Deduct: $1408      Out of Pocket Max: none      Life Max: none CIR: 100%      SNF: has been out of SNF since 07/21/2018 where she has used 99 days since 04/10/2018. Was out of SNF 07/21/2018 at home so 49 days outside of acute hospital and SNF Outpatient: 80%     Co-Pay: 20% Home Health: 100%      Co-Pay: none DME: 80%     Co-Pay: 20% Providers: pt choice  SECONDARY: A[employer]      Policy#: [id number]      Subscriber: pt   Medicaid Application Date:       Case Manager:  Disability Application Date:       Case Worker:    The Data Collection Information Summary for patients in Inpatient Rehabilitation Facilities with attached Privacy Act [place] Records was provided and verbally reviewed with: Patient   Emergency Contact Information         Contact Information     Name Relation Home Work Mobile    [name] [name] Spouse [phone]   [phone]    [name] [name] Daughter     [phone]        Current Medical History  Patient Admitting Diagnosis: Debility   History of Present Illness: 73 year old right-handed female history of hypertension, chronic back pain maintained on oxycodone as needed type II diabetes mellitus, hyperlipidemia, asthma using oxygen as needed at night,  chronic diastolic congestive heart failure with ejection fraction 25%, CVA, CAD with CABG, atrial fibrillation maintained on Eliquis as well as Plavix followed by Dr. Loralie Champagne, CKD stage III, OSA not on CPAP. Presented 09/09/2018 with transient slurred speech, dizziness, vomiting as well as fall x3 without loss of consciousness.  Noted bruises to her left upper back and left leg.  WBC 14,500, creatinine 1.13, lactic acid 1.2, COVID negative, urinalysis negative, potassium 3.3, low-grade fever 100.9, blood pressure 177/71.  Chest x-ray showed cardiomegaly and vascular congestion with mild pulmonary edema as well as developing bronchopneumonia right lower lobe.  MRSA PCR screening positive.  X-rays of left femur and left rib negative for fracture.  CT of the head as well as MRI/MRA showed no acute ischemic infarct or intracranial hemorrhage.  Severe intracranial atherosclerosis noted.  Severe stenosis of left PCA P1 and P2 segments unchanged from compared to 06/01/2014.  Severe stenosis of both internal carotid arteries at the skull base progressed compared to 06/01/2014.  Neurology follow-up to address MRI suspect subacute to chronic right frontal lobe infarct new from prior scan of February  2020.  It was advised to continue Eliquis and Plavix as prior to admission.  Patient maintained on vancomycin and cefepime with fevers and leukocytosis improved.  She did receive low-dose diuresis monitoring for any signs of hypoxia.  Patient is tolerating a regular consistency diet.     Complete NIHSS TOTAL: 0   Patient's medical record from [facility] has been reviewed by the rehabilitation admission coordinator and physician.   Past Medical History      Past Medical History:  Diagnosis Date   Abnormality of gait 05/14/2014   Anemia      hx   Anginal pain ([place])     Anxiety     Asthma     Basal cell carcinoma 05/2014    "left shoulder"   Bundle branch block, left      chronic/notes 07/18/2013     CHF (congestive heart failure) ([place])     Chronic bronchitis ([place])      "off and on all the time" (07/18/2013)   Chronic insomnia 05/06/2015   Chronic kidney disease      frequency, sees dr Jamal Maes every 4 to 6 months (01/16/2018)   Chronic low back pain 08/24/2016   Chronic lower back pain     Claustrophobia     Common migraine 05/14/2014   Coronary artery disease     Depression     Dysrhythmia     GERD (gastroesophageal reflux disease)     H/O hiatal hernia     Headache      "at least 2/month" (01/16/2018)   Heart murmur     Hyperlipidemia     Hypertension     Irregular heart beat     Leg cramps      both legs at times   Melanoma ([place]) 05/2014    "burned off BLE" (01/16/2018)   Memory change 05/14/2014   Migraine      "5-6/year"  (01/16/2018)   Myocardial infarction ([place]) 04/1999, 02/2000, 01/2005; 2011; 2014   Neuromuscular disorder ([place])      ?   Obesity 01-2010   Obstructive sleep apnea      "can't wear machine; I have claustrophobia" (01/16/2018), states she had a 2nd sleep study and does not have sleep apnea, her O2 decreases and now is on 2 L of O2 at night.   On home oxygen therapy      "2L at night and prn during daytime" (01/16/2018)   Osteoarthritis      "knees and hands" (01/16/2018)   Other and unspecified angina pectoris     Peripheral vascular disease ([place])      ? numbness, tingling arms and legs   Pneumonia 2000's    "once"   PONV (postoperative nausea and vomiting)     Shortness of breath      with exertion   Stroke ([place]) 03/22/12    right side brain; denies residual on 07/18/2013)   Stroke [facility]) Oct. 2015    Affected pt.s balance   Stroke ([place]) 10/2014    "affected my legs; fully recovered now"; still have sporatic memory issues from this one" (01/16/2018)   Type II diabetes mellitus (HCC)     Ventral hernia      hx of   Vomiting      persistent   Vomiting blood       Family History   family  history includes AAA (abdominal aortic aneurysm) in her father and mother; Cancer in her sister; Deep vein thrombosis in  her father and mother; Diabetes in her father, paternal aunt, paternal grandmother, paternal uncle, and paternal uncle; Heart attack in her father; Heart attack (age of onset: 43) in her paternal grandfather; Heart disease in her father and paternal uncle; Hyperlipidemia in her father; Hypertension in her father, mother, and sister.   Prior Rehab/Hospitalizations Has the patient had prior rehab or hospitalizations prior to admission? Yes    SNF at [facility] in [place] 04/09/29 until 05/16/2018. At home after d/c as she got out of car, fractured ankle. Returned to SNF until 07/21/2018 when she returned home with no weight bearing restrictions. She used 99 days of SNF benefit.   Has the patient had major surgery during 100 days prior to admission? No  115 days since ankle surgery in April 05/17/2018   Current Medications  Current Facility-Administered Medications:    0.9 %  sodium chloride infusion, , Intravenous, PRN, Debbe Odea, MD, Stopped at 09/12/18 0214   acetaminophen (TYLENOL) tablet 650 mg, 650 mg, Oral, Q4H PRN **OR** acetaminophen (TYLENOL) solution 650 mg, 650 mg, Per Tube, Q4H PRN **OR** acetaminophen (TYLENOL) suppository 650 mg, 650 mg, Rectal, Q4H PRN, Ivor Costa, MD   albuterol (PROVENTIL) (2.5 MG/3ML) 0.083% nebulizer solution 2.5 mg, 2.5 mg, Nebulization, Q4H PRN, Ivor Costa, MD   ALPRAZolam Duanne Moron) tablet 0.5 mg, 0.5 mg, Oral, BID, Ivor Costa, MD, 0.5 mg at 09/13/18 0947   amLODipine (NORVASC) tablet 10 mg, 10 mg, Oral, Daily, Rizwan, Saima, MD, 10 mg at 09/13/18 0947   apixaban (ELIQUIS) tablet 5 mg, 5 mg, Oral, BID, Aroor, Karena Addison R, MD, 5 mg at 09/13/18 0947   bisacodyl (DULCOLAX) EC tablet 5 mg, 5 mg, Oral, Daily PRN, Ivor Costa, MD   budesonide (PULMICORT) nebulizer solution 0.25 mg, 0.25 mg, Nebulization, BID, Ivor Costa, MD, 0.25 mg at  09/13/18 0835   buPROPion (WELLBUTRIN XL) 24 hr tablet 150 mg, 150 mg, Oral, Daily, Ivor Costa, MD, 150 mg at 09/13/18 0947   Chlorhexidine Gluconate Cloth 2 % PADS 6 each, 6 each, Topical, Q0600, Debbe Odea, MD, 6 each at 09/13/18 8413   clopidogrel (PLAVIX) tablet 75 mg, 75 mg, Oral, QHS, Niu, Soledad Gerlach, MD, 75 mg at 09/12/18 2207   dextromethorphan-guaiFENesin (Violet DM) 30-600 MG per 12 hr tablet 1 tablet, 1 tablet, Oral, BID PRN, Ivor Costa, MD   DULoxetine (CYMBALTA) DR capsule 30 mg, 30 mg, Oral, Daily, Ivor Costa, MD, 30 mg at 09/13/18 0947   ezetimibe (ZETIA) tablet 10 mg, 10 mg, Oral, QHS, Ivor Costa, MD, 10 mg at 09/12/18 2207   gabapentin (NEURONTIN) capsule 600 mg, 600 mg, Oral, BID, Ivor Costa, MD, 600 mg at 09/13/18 0948   hydrALAZINE (APRESOLINE) injection 5 mg, 5 mg, Intravenous, Q2H PRN, Ivor Costa, MD, 5 mg at 09/10/18 0757   insulin aspart (novoLOG) injection 0-9 Units, 0-9 Units, Subcutaneous, TID WC, Ivor Costa, MD, 2 Units at 09/13/18 0711   insulin aspart (novoLOG) injection 4 Units, 4 Units, Subcutaneous, TID WC, Rizwan, Saima, MD, 4 Units at 09/13/18 0712   insulin glargine (LANTUS) injection 28 Units, 28 Units, Subcutaneous, QHS, Rizwan, Saima, MD, 28 Units at 09/12/18 2221   ipratropium-albuterol (DUONEB) 0.5-2.5 (3) MG/3ML nebulizer solution 3 mL, 3 mL, Nebulization, BID PRN, Ivor Costa, MD   levofloxacin (LEVAQUIN) tablet 500 mg, 500 mg, Oral, Q24H, Adhikari, Amrit, MD   metoCLOPramide (REGLAN) injection 10 mg, 10 mg, Intravenous, Q12H PRN, Aroor, Lanice Schwab, MD   metolazone (ZAROXOLYN) tablet 2.5 mg, 2.5 mg, Oral, Daily, Niu,  Soledad Gerlach, MD, 2.5 mg at 09/13/18 0947   metoprolol succinate (TOPROL-XL) 24 hr tablet 50 mg, 50 mg, Oral, Daily, Rizwan, Saima, MD, 50 mg at 09/13/18 0948   mometasone-formoterol (DULERA) 200-5 MCG/ACT inhaler 2 puff, 2 puff, Inhalation, BID, Ivor Costa, MD, 2 puff at 09/13/18 0837   multivitamin (PROSIGHT) tablet 1 tablet, 1  tablet, Oral, Daily, Ivor Costa, MD, 1 tablet at 09/13/18 0948   mupirocin ointment (BACTROBAN) 2 % 1 application, 1 application, Nasal, BID, Rizwan, Eunice Blase, MD, 1 application at 40/97/35 0948   nitroGLYCERIN (NITROSTAT) SL tablet 0.3 mg, 0.3 mg, Sublingual, Q5 Min x 3 PRN, Ivor Costa, MD   ondansetron [facility]) injection 4 mg, 4 mg, Intravenous, Q8H PRN, Ivor Costa, MD   oxyCODONE (Oxy IR/ROXICODONE) immediate release tablet 5 mg, 5 mg, Oral, Q6H PRN, Ivor Costa, MD   pantoprazole (PROTONIX) EC tablet 40 mg, 40 mg, Oral, Daily, Ivor Costa, MD, 40 mg at 09/13/18 0947   ranolazine (RANEXA) 12 hr tablet 1,000 mg, 1,000 mg, Oral, Q12H, Ivor Costa, MD, 1,000 mg at 09/13/18 0947   rosuvastatin (CRESTOR) tablet 40 mg, 40 mg, Oral, QHS, Niu, Soledad Gerlach, MD, 40 mg at 09/12/18 2205   sodium chloride flush (NS) 0.9 % injection 3 mL, 3 mL, Intravenous, Once, Ivor Costa, MD   triamcinolone cream (KENALOG) 0.1 % 1 application, 1 application, Topical, Daily, Ivor Costa, MD, 1 application at 32/99/24 0948   vitamin B-12 (CYANOCOBALAMIN) tablet 1,000 mcg, 1,000 mcg, Oral, Daily, Ivor Costa, MD, 1,000 mcg at 09/13/18 0947   zolpidem (AMBIEN) tablet 5 mg, 5 mg, Oral, QHS, Ivor Costa, MD, 5 mg at 09/12/18 2207   Patients Current Diet:     Diet Order                      Diet - low sodium heart healthy           Diet heart healthy/carb modified Room service appropriate? Yes; Fluid consistency: Thin; Fluid restriction: 1200 mL Fluid  Diet effective now                  Precautions / Restrictions Precautions Precautions: Fall Precaution Comments: hx of falls at home (~8 in last 6 months) Restrictions Weight Bearing Restrictions: No    Has the patient had 2 or more falls or a fall with injury in the past year? Yes Patient reports 4 falls since d/c from [facility] 07/21/2018. She has issues with low heart rate and BP at times.   Prior Activity Level Limited Community (1-2x/wk): decline in function since  February hospitalization   Prior Functional Level Self Care: Did the patient need help bathing, dressing, using the toilet or eating? Needed some help   Indoor Mobility: Did the patient need assistance with walking from room to room (with or without device)? Independent   Stairs: Did the patient need assistance with internal or external stairs (with or without device)? Needed some help   Functional Cognition: Did the patient need help planning regular tasks such as shopping or remembering to take medications? Independent   Home Equities trader / Equipment Home Assistive Devices/Equipment: Wheelchair, Shower chair without back, CBG Meter, Environmental consultant (specify type) Home Equipment: Walker - 4 wheels, Shower seat   Prior Device Use: Indicate devices/aids used by the patient prior to current illness, exacerbation or injury? Walker   Current Functional Level Cognition   Arousal/Alertness: Awake/alert Overall Cognitive Status: Within Functional Limits for tasks assessed Orientation Level: Oriented X4 Memory: Impaired  Memory Impairment: Retrieval deficit, Decreased short term memory, Other (comment)(baseline deficits) Awareness: Appears intact Problem Solving: Appears intact Safety/Judgment: Appears intact    Extremity Assessment (includes Sensation/Coordination)   Upper Extremity Assessment: Generalized weakness  Lower Extremity Assessment: Defer to PT evaluation     ADLs   Overall ADL's : Needs assistance/impaired Eating/Feeding: Independent, Sitting Grooming: Set up, Sitting Upper Body Bathing: Minimal assistance, Sitting Lower Body Bathing: Minimal assistance, Sit to/from stand Upper Body Dressing : Minimal assistance, Sitting Lower Body Dressing: Minimal assistance, Sit to/from stand Toilet Transfer: Minimal assistance, Ambulation, RW Functional mobility during ADLs: Minimal assistance     Mobility   Overal bed mobility: Needs Assistance Bed Mobility: Supine to Sit Supine to  sit: Min assist Sit to supine: Mod assist General bed mobility comments: use of rail and increased time and effort needed; assist to bring hips to EOB with use of bed pad     Transfers   Overall transfer level: Needs assistance Equipment used: Rolling walker (2 wheeled) Transfers: Sit to/from Stand Sit to Stand: Min guard Stand pivot transfers: Mod assist General transfer comment: min guard for safety from EOB and recliner; cues for safe hand placement     Ambulation / Gait / Stairs / Wheelchair Mobility   Ambulation/Gait Ambulation/Gait assistance: Min assist, Mod assist Gait Distance (Feet): (60 ft then ~6 ft) Assistive device: Rolling walker (2 wheeled) Gait Pattern/deviations: Step-through pattern, Step-to pattern, Decreased stride length General Gait Details: pt with decreased cadence and stride length; initial gait bout pt demonstrates R LE weakness and near buckling and after seate rest break pt agreeable to attempt further ambulation however bilat LE weakness was significant and tolerated only ~6 ft; chair follow for safety  Gait velocity: decreased     Posture / Balance Balance Overall balance assessment: Needs assistance, History of Falls Sitting-balance support: Feet supported, No upper extremity supported Sitting balance-Leahy Scale: Fair Standing balance support: During functional activity Standing balance-Leahy Scale: Poor Standing balance comment: Reliant on BUEs and external support for standing balance.     Special needs/care consideration BiPAP/CPAP OSA but no CPAP CPM  N/a Continuous Drip IV  N/a Dialysis n/a Life Vest n/a Oxygen  O2 at 2 liters [place] at HS only pta; 09/13/2018 using 1 to 2 liters [place] on acute Special Bed  N/a Trach Size  N/a Wound Vac n/a Skin rash left arm; ecchymosis to bilateral UE; ecchymosis to left buttocks region Bowel mgmt:  continent LBM 8/11 Bladder mgmt: external catheter; uses incontinence pad at home due to infrequent incontinence  likely related to diuretic use Diabetic mgmt: n/a Behavioral consideration  N/a Chemo/radiation  N/a Spouse, [name] is Media planner    Previous Home Environment  Living Arrangements: Spouse/significant other  Lives With: Spouse Available Help at Discharge: (spouse and daughter provide 24/7 supervision) Type of Home: House Home Layout: Two level, 1/2 bath on main level, Bed/bath upstairs Alternate Level Stairs-Rails: Can reach both Alternate Level Stairs-Number of Steps: has a stair lift Home Access: Level entry Bathroom Shower/Tub: Multimedia programmer: Standard Bathroom Accessibility: Yes How Accessible: Accessible via walker Home Care Services: Yes Type of Home Care Services: Home RN, [place] (if known): [facility]   Discharge Living Setting Plans for Discharge Living Setting: Patient's home, Lives with (comment)(spouse) Type of Home at Discharge: House Discharge Home Layout: Two level, 1/2 bath on main level, Bed/bath upstairs Alternate Level Stairs-Rails: Right, Left, Can reach both(Has chair lift) Alternate Level Stairs-Number of Steps: flight/has chair  lift Discharge Home Access: Level entry Discharge Bathroom Shower/Tub: Walk-in shower Discharge Bathroom Toilet: Standard Discharge Bathroom Accessibility: Yes How Accessible: Accessible via walker Does the patient have any problems obtaining your medications?: No   Social/Family/Support Systems Patient Roles: Spouse, Parent Contact Information: spouse , [name] Anticipated Caregiver: [name] and daughter, Danton Clap Anticipated Caregiver's Contact Information: cell [phone] Ability/Limitations of Caregiver: spouse has own buisiness, lift for vehicles and dtr is RN with [employer]; they arrange 24/7 assist between them Caregiver Availability: 24/7 Discharge Plan Discussed with Primary Caregiver: Yes Is Caregiver In Agreement with Plan?: Yes Does Caregiver/Family have Issues with  Lodging/Transportation while Pt is in Rehab?: No     Daughter, Danton Clap is [facility] RN with [employer]. Does not live with her parents but assists with 24/7 assist when spouse working. 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   Goals/Additional Needs Patient/Family Goal for Rehab: supervision PT, supervision to [place] OT Expected length of stay: ELOS 10 to 12 days Special Service Needs: Spouse, [name] is visitor designee Pt/Family Agrees to Admission and willing to participate: Yes Program Orientation Provided & Reviewed with Pt/Caregiver Including Roles  & Responsibilities: Yes   Decrease burden of Care through IP rehab admission: n/a   Possible need for SNF placement upon discharge: not anticipated. Has used 99 days of 100 days SNF so far this year.   Patient Condition: I have reviewed medical records from [facility] , spoken with CM, and patient and spouse. I met with patient at the bedside for inpatient rehabilitation assessment.  Patient will benefit from ongoing PT and OT, can actively participate in 3 hours of therapy a day 5 days of the week, and can make measurable gains during the admission.  Patient will also benefit from the coordinated team approach during an Inpatient Acute Rehabilitation admission.  The patient will receive intensive therapy as well as Rehabilitation physician, nursing, social worker, and care management interventions.  Due to bladder management, bowel management, safety, skin/wound care, disease management, medication administration, pain management and patient education the patient requires 24 hour a day rehabilitation nursing.  The patient is currently min to mod assist with mobility and basic ADLs.  Discharge setting and therapy post discharge at home with home health is anticipated.  Patient has agreed to participate in the Acute Inpatient Rehabilitation Program and will admit today.   Preadmission Screen Completed By:  Cleatrice Burke RN MSN 09/13/2018  11:23 AM ______________________________________________________________________   Discussed status with Dr. Dagoberto Ligas on 09/13/2018  at  1140 and received approval for admission today.   Admission Coordinator:  Cleatrice Burke, RN, time  1140 Date  09/13/2018    Assessment/Plan: Diagnosis: 1. Does the need for close, 24 hr/day Medical supervision in concert with the patient's rehab needs make it unreasonable for this patient to be served in a less intensive setting? Yes 2. Co-Morbidities requiring supervision/potential complications: CHF with EF of 25%, recent CVA with new subacute CVA, admission for pneumonia, and recent ankle fx 3. Due to bladder management, safety, skin/wound care and medication administration, does the patient require 24 hr/day rehab nursing? Yes 4. Does the patient require coordinated care of a physician, rehab nurse, PT (1-2 hrs hrs/day, 5 days/week), OT (1-2 hrs hrs/day, 5 days/week) and SLP (1 hr hrs/day, 5 days/week) to address physical and functional deficits in the context of the above medical diagnosis(es)? Yes Addressing deficits in the following areas: balance, endurance, locomotion, strength, transferring, bathing, dressing, toileting, language and psychosocial support 5. Can the patient  actively participate in an intensive therapy program of at least 3 hrs of therapy 5 days a week? Yes 6. The potential for patient to make measurable gains while on inpatient rehab is fair 7. Anticipated functional outcomes upon discharge from inpatients are: supervision PT, supervision OT, n/a SLP 8. Estimated rehab length of stay to reach the above functional goals is: 1-12 days 9. Anticipated D/C setting: Home with husband/daughter 10. Anticipated post D/C treatments: [place] therapy 11. Overall Rehab/Functional Prognosis: fair   MD Signature:          Revision History

## 2018-09-13 NOTE — TOC Transition Note (Signed)
Transition of Care [facility]) - CM/SW Discharge Note   Patient Details  Name: [name] [name] [name]: [id number] Date of Birth: 10-05-1945  Transition of [facility]) CM/SW Contact:  Pollie Friar, RN Phone Number: 09/13/2018, 11:07 AM   Clinical Narrative:    Pt is discharging to CIR today. CM signing off.    Final next level of care: IP Rehab Facility Barriers to Discharge: No Barriers Identified   Patient Goals and CMS Choice        Discharge Placement                       Discharge Plan and Services   Discharge Planning Services: CM Consult Post Acute Care Choice: IP Rehab                               Social Determinants of Health (SDOH) Interventions     Readmission Risk Interventions Readmission Risk Prevention Plan 05/17/2018  Transportation Screening Complete  PCP or Specialist Appt within 3-5 Days Complete  HRI or [place] Complete  Social Work Consult for [place] Planning/Counseling Complete  Palliative Care Screening Not Applicable  Medication Review Press photographer) Complete  Some recent data might be hidden

## 2018-09-14 ENCOUNTER — Inpatient Hospital Stay (HOSPITAL_COMMUNITY): Payer: Medicare Other | Admitting: Physical Therapy

## 2018-09-14 ENCOUNTER — Inpatient Hospital Stay (HOSPITAL_COMMUNITY): Payer: Medicare Other | Admitting: Occupational Therapy

## 2018-09-14 DIAGNOSIS — E876 Hypokalemia: Secondary | ICD-10-CM

## 2018-09-14 DIAGNOSIS — I639 Cerebral infarction, unspecified: Secondary | ICD-10-CM

## 2018-09-14 DIAGNOSIS — I5032 Chronic diastolic (congestive) heart failure: Secondary | ICD-10-CM

## 2018-09-14 DIAGNOSIS — I10 Essential (primary) hypertension: Secondary | ICD-10-CM

## 2018-09-14 DIAGNOSIS — R5381 Other malaise: Principal | ICD-10-CM

## 2018-09-14 LAB — COMPREHENSIVE METABOLIC PANEL
ALT: 32 U/L (ref 0–44)
AST: 40 U/L (ref 15–41)
Albumin: 2.9 g/dL — ABNORMAL LOW (ref 3.5–5.0)
Alkaline Phosphatase: 104 U/L (ref 38–126)
Anion gap: 17 — ABNORMAL HIGH (ref 5–15)
BUN: 29 mg/dL — ABNORMAL HIGH (ref 8–23)
CO2: 42 mmol/L — ABNORMAL HIGH (ref 22–32)
Calcium: 9.6 mg/dL (ref 8.9–10.3)
Chloride: 75 mmol/L — ABNORMAL LOW (ref 98–111)
Creatinine, Ser: 1.61 mg/dL — ABNORMAL HIGH (ref 0.44–1.00)
GFR calc Af Amer: 37 mL/min — ABNORMAL LOW (ref >60)
GFR calc non Af Amer: 32 mL/min — ABNORMAL LOW (ref >60)
Glucose, Bld: 178 mg/dL — ABNORMAL HIGH (ref 70–99)
Potassium: 2.6 mmol/L — CL (ref 3.5–5.1)
Sodium: 134 mmol/L — ABNORMAL LOW (ref 135–145)
Total Bilirubin: 1.4 mg/dL — ABNORMAL HIGH (ref 0.3–1.2)
Total Protein: 6.9 g/dL (ref 6.5–8.1)

## 2018-09-14 LAB — BASIC METABOLIC PANEL
Anion gap: 18 — ABNORMAL HIGH (ref 5–15)
BUN: 28 mg/dL — ABNORMAL HIGH (ref 8–23)
CO2: 39 mmol/L — ABNORMAL HIGH (ref 22–32)
Calcium: 9.9 mg/dL (ref 8.9–10.3)
Chloride: 74 mmol/L — ABNORMAL LOW (ref 98–111)
Creatinine, Ser: 1.72 mg/dL — ABNORMAL HIGH (ref 0.44–1.00)
GFR calc Af Amer: 34 mL/min — ABNORMAL LOW (ref >60)
GFR calc non Af Amer: 30 mL/min — ABNORMAL LOW (ref >60)
Glucose, Bld: 281 mg/dL — ABNORMAL HIGH (ref 70–99)
Potassium: 3.3 mmol/L — ABNORMAL LOW (ref 3.5–5.1)
Sodium: 131 mmol/L — ABNORMAL LOW (ref 135–145)

## 2018-09-14 LAB — CBC WITH DIFFERENTIAL/PLATELET
Abs Immature Granulocytes: 0.03 10*3/uL (ref 0.00–0.07)
Basophils Absolute: 0.1 10*3/uL (ref 0.0–0.1)
Basophils Relative: 1 %
Eosinophils Absolute: 0.5 10*3/uL (ref 0.0–0.5)
Eosinophils Relative: 4 %
HCT: 38.6 % (ref 36.0–46.0)
Hemoglobin: 12.3 g/dL (ref 12.0–15.0)
Immature Granulocytes: 0 %
Lymphocytes Relative: 8 %
Lymphs Abs: 0.9 10*3/uL (ref 0.7–4.0)
MCH: 28 pg (ref 26.0–34.0)
MCHC: 31.9 g/dL (ref 30.0–36.0)
MCV: 87.9 fL (ref 80.0–100.0)
Monocytes Absolute: 1 10*3/uL (ref 0.1–1.0)
Monocytes Relative: 9 %
Neutro Abs: 8.8 10*3/uL — ABNORMAL HIGH (ref 1.7–7.7)
Neutrophils Relative %: 78 %
Platelets: 196 10*3/uL (ref 150–400)
RBC: 4.39 MIL/uL (ref 3.87–5.11)
RDW: 14.9 % (ref 11.5–15.5)
WBC: 11.3 10*3/uL — ABNORMAL HIGH (ref 4.0–10.5)
nRBC: 0 % (ref 0.0–0.2)

## 2018-09-14 LAB — GLUCOSE, CAPILLARY
Glucose-Capillary: 202 mg/dL — ABNORMAL HIGH (ref 70–99)
Glucose-Capillary: 279 mg/dL — ABNORMAL HIGH (ref 70–99)
Glucose-Capillary: 265 mg/dL — ABNORMAL HIGH (ref 70–99)
Glucose-Capillary: 177 mg/dL — ABNORMAL HIGH (ref 70–99)

## 2018-09-14 LAB — CULTURE, BLOOD (ROUTINE X 2)
Culture: NO GROWTH
Culture: NO GROWTH
Special Requests: ADEQUATE
Special Requests: ADEQUATE

## 2018-09-14 MED ORDER — INSULIN GLARGINE 100 UNIT/ML ~~LOC~~ SOLN
32.0000 [IU] | Freq: Every day | SUBCUTANEOUS | Status: DC
  Administered 2018-09-14 – 2018-09-17 (×4): 32 [IU] via SUBCUTANEOUS
  Filled 2018-09-14 (×4): qty 0.32

## 2018-09-14 MED ORDER — POTASSIUM CHLORIDE CRYS ER 20 MEQ PO TBCR: 20.0000 meq | EXTENDED_RELEASE_TABLET | Freq: Two times a day (BID) | ORAL | Status: DC

## 2018-09-14 MED ORDER — POTASSIUM CHLORIDE CRYS ER 20 MEQ PO TBCR
40.0000 meq | EXTENDED_RELEASE_TABLET | Freq: Two times a day (BID) | ORAL | Status: DC
  Administered 2018-09-14 – 2018-09-15 (×2): 40 meq via ORAL
  Filled 2018-09-14 (×2): qty 2

## 2018-09-14 MED ORDER — POTASSIUM CHLORIDE CRYS ER 20 MEQ PO TBCR
40.0000 meq | EXTENDED_RELEASE_TABLET | Freq: Three times a day (TID) | ORAL | Status: DC
  Administered 2018-09-14 (×2): 40 meq via ORAL
  Filled 2018-09-14 (×2): qty 2

## 2018-09-14 NOTE — H&P (Signed)
Physical Medicine and Rehabilitation Admission H&P     Chief Complaint  Patient presents with   Dizziness   Fall   Shortness of Breath  :  HPI: [name] [name] is a 73 year old right-handed female history of hypertension, chronic back pain maintained on oxycodone as needed type II diabetes mellitus, hyperlipidemia, asthma using oxygen as needed at night, chronic diastolic congestive heart failure with ejection fraction 25%, CVA, CAD with CABG, atrial fibrillation maintained on Eliquis as well as Plavix followed by Dr. Hildred Priest, CKD stage III, OSA not on CPAP. Per chart review patient lives with spouse. Used a rolling walker for mobility with reports of multiple falls. Husband daughter and family assist as needed. Patient had been a skilled nursing facility up until end of July for bouts of pneumonia. 2 level home with bed and bathroom upstairs. Presented 09/09/2018 with transient slurred speech, dizziness, vomiting as well as fall x3 without loss of consciousness. Noted bruises to her left upper back and left leg. WBC 14,500, creatinine 1.13, lactic acid 1.2, COVID negative, urinalysis negative, potassium 3.3, low-grade fever 100.9, blood pressure 177/71. Chest x-ray showed cardiomegaly and vascular congestion with mild pulmonary edema as well as developing bronchopneumonia right lower lobe. MRSA PCR screening positive. X-rays of left femur and left rib negative for fracture. CT of the head as well as MRI/MRA showed no acute ischemic infarct or intracranial hemorrhage. Severe intracranial atherosclerosis noted. Severe stenosis of left PCA P1 and P2 segments unchanged from compared to 06/01/2014. Severe stenosis of both internal carotid arteries at the skull base progressed compared to 06/01/2014. Neurology follow-up to address MRI suspect subacute to chronic right frontal lobe infarct new from prior scan of February 2020. It was advised to continue Eliquis and Plavix as prior to admission. Patient  maintained on vancomycin and cefepime with fevers and leukocytosis improved and changed to Levaquin 812 20203 doses. She did receive low-dose diuresis monitoring for any signs of hypoxia. Patient is tolerating a regular consistency diet. Therapy evaluations completed and patient was admitted for a comprehensive rehab program.  Pt is currently using a Purewick- an external female catheter- which is working well. She was also receiving a cream on her L forearm/upper arm that was helpful for what appears to be psoriasis (was told it was eczema- very painful/burning/itching).  Her CO2 up to 42- likely from Lasix and pt isn't usually on O2 during day, however hasn't tolerated a wean from daytime O2 as of yet- was very SOB yesterday when tried.  LBm yesterday. Has a lot of bruising all over from multiple falls, and when she has another "small stroke" she gets confused and speech gets halting/confused/partially aphasic.  Doesn't use CPAP for OSA.  Review of Systems  Constitutional: Positive for fever and malaise/fatigue.  HENT: Negative for hearing loss.  Eyes: Negative for blurred vision and double vision.  Respiratory: Positive for shortness of breath. Negative for cough and wheezing.  Cardiovascular: Positive for palpitations and leg swelling. Negative for chest pain.  Gastrointestinal: Positive for constipation. Negative for heartburn, nausea and vomiting.  GERD  Genitourinary: Negative for dysuria, flank pain and hematuria.  Musculoskeletal: Positive for back pain, falls, joint pain and myalgias.  Skin: Negative for rash.  Psychiatric/Behavioral: Positive for depression. The patient has insomnia.  Anxiety  All other systems reviewed and are negative.       Past Medical History:  Diagnosis Date   Abnormality of gait 05/14/2014   Anemia    hx   Anginal pain (  [place])    Anxiety    Asthma    Basal cell carcinoma 05/2014   "left shoulder"   Bundle branch block, left    chronic/notes  07/18/2013   CHF (congestive heart failure) ([place])    Chronic bronchitis ([place])    "off and on all the time" (07/18/2013)   Chronic insomnia 05/06/2015   Chronic kidney disease    frequency, sees dr Jamal Maes every 4 to 6 months (01/16/2018)   Chronic low back pain 08/24/2016   Chronic lower back pain    Claustrophobia    Common migraine 05/14/2014   Coronary artery disease    Depression    Dysrhythmia    GERD (gastroesophageal reflux disease)    H/O hiatal hernia    Headache    "at least 2/month" (01/16/2018)   Heart murmur    Hyperlipidemia    Hypertension    Irregular heart beat    Leg cramps    both legs at times   Melanoma ([place]) 05/2014   "burned off BLE" (01/16/2018)   Memory change 05/14/2014   Migraine    "5-6/year" (01/16/2018)   Myocardial infarction ([place]) 04/1999, 02/2000, 01/2005; 2011; 2014   Neuromuscular disorder ([place])    ?   Obesity 01-2010   Obstructive sleep apnea    "can't wear machine; I have claustrophobia" (01/16/2018), states she had a 2nd sleep study and does not have sleep apnea, her O2 decreases and now is on 2 L of O2 at night.   On home oxygen therapy    "2L at night and prn during daytime" (01/16/2018)   Osteoarthritis    "knees and hands" (01/16/2018)   Other and unspecified angina pectoris    Peripheral vascular disease ([place])    ? numbness, tingling arms and legs   Pneumonia 2000's   "once"   PONV (postoperative nausea and vomiting)    Shortness of breath    with exertion   Stroke ([place]) 03/22/12   right side brain; denies residual on 07/18/2013)   Stroke [facility]) Oct. 2015   Affected pt.s balance   Stroke ([place]) 10/2014   "affected my legs; fully recovered now"; still have sporatic memory issues from this one" (01/16/2018)   Type II diabetes mellitus ([facility])    Ventral hernia    hx of   Vomiting    persistent   Vomiting blood         Past Surgical History:  Procedure Laterality Date   ANKLE  FRACTURE SURGERY Left 1970's   APPENDECTOMY  1970's   w/hysterectomy   BASAL CELL CARCINOMA EXCISION Left 05/2014   "shoulder" (01/16/2018)   CARDIAC CATHETERIZATION  10/10/2012   Dr [name].   CARDIAC CATHETERIZATION N/A 05/29/2015   Procedure: Right/Left Heart Cath and Coronary/Graft Angiography; Surgeon: Larey Dresser, MD; Location: [facility]; Service: Cardiovascular; Laterality: N/A;   CAROTID ENDARTERECTOMY Left 03/2013   CAROTID STENT INSERTION Left 03/20/2013   Procedure: CAROTID STENT INSERTION; Surgeon: [name], MD; Location: [facility] CATH LAB; Service: Cardiovascular; Laterality: Left; internal carotid   CEREBRAL ANGIOGRAM N/A 04/05/2011   Procedure: CEREBRAL ANGIOGRAM; Surgeon: Angelia Mould, MD; Location: [facility] CATH LAB; Service: Cardiovascular; Laterality: N/A;   CHOLECYSTECTOMY OPEN  2004   CORONARY ANGIOPLASTY WITH STENT PLACEMENT  01,02,05,06,07,08,11; 04/24/2013   "I've probably got ~ 10 stents by now" (04/24/2013)   CORONARY ANGIOPLASTY WITH STENT PLACEMENT  06/13/2013   "got 4 stents today" (06/13/2013)   CORONARY ARTERY BYPASS GRAFT  1220/11   "  CABG X5"   CORONARY STENT INTERVENTION N/A 10/20/2017   Procedure: CORONARY STENT INTERVENTION; Surgeon: Troy Sine, MD; Location: [facility]; Service: Cardiovascular; Laterality: N/A;   ESOPHAGOGASTRODUODENOSCOPY  08/03/2011   Procedure: ESOPHAGOGASTRODUODENOSCOPY (EGD); Surgeon: Shann Medal, MD; Location: [facility]; Service: General; Laterality: N/A;   ESOPHAGOGASTRODUODENOSCOPY (EGD) WITH PROPOFOL N/A 03/11/2014   Procedure: ESOPHAGOGASTRODUODENOSCOPY (EGD) WITH PROPOFOL; Surgeon: Lafayette Dragon, MD; Location: [facility]; Service: Endoscopy; Laterality: N/A;   FRACTURE SURGERY     gall stone removal  05/2003   GASTRIC RESTRICTION SURGERY  1984   "stapeling"   HERNIA REPAIR  2004   "in my stomach; had OR on it twice", wire mesh on 1 hernia   LEFT HEART CATH AND CORS/GRAFTS  ANGIOGRAPHY N/A 07/09/2016   Procedure: LEFT HEART CATH AND CORS/GRAFTS ANGIOGRAPHY; Surgeon: Larey Dresser, MD; Location: [facility]; Service: Cardiovascular; Laterality: N/A;   ORIF ANKLE FRACTURE Left 05/16/2018   ORIF ANKLE FRACTURE Left 05/16/2018   Procedure: OPEN REDUCTION INTERNAL FIXATION (ORIF) Left ankle with possible syndesmosis fixation; Surgeon: Nicholes Stairs, MD; Location: [facility]; Service: Orthopedics; Laterality: Left; 135min   OVARY SURGERY  1970's   "tumor removed"   PERCUTANEOUS CORONARY STENT INTERVENTION (PCI-S) N/A 06/13/2013   Procedure: PERCUTANEOUS CORONARY STENT INTERVENTION (PCI-S); Surgeon: [name], MD; Location: [facility] CATH LAB; Service: Cardiovascular; Laterality: N/A;   PERCUTANEOUS STENT INTERVENTION N/A 04/24/2013   Procedure: PERCUTANEOUS STENT INTERVENTION; Surgeon: [name], MD; Location: [facility] CATH LAB; Service: Cardiovascular; Laterality: N/A;   RIGHT/LEFT HEART CATH AND CORONARY/GRAFT ANGIOGRAPHY N/A 10/20/2017   Procedure: RIGHT/LEFT HEART CATH AND CORONARY/GRAFT ANGIOGRAPHY; Surgeon: Larey Dresser, MD; Location: [facility]; Service: Cardiovascular; Laterality: N/A;   ROOT CANAL  10/2000   TIBIA FRACTURE SURGERY Right 1970's   rods and pins   TOOTH EXTRACTION     "1 on the upper; wisdom tooth on the lower" (01/16/2018)   TOTAL ABDOMINAL HYSTERECTOMY  1970's   w/ appendectomy        Family History  Problem Relation Age of Onset   Heart disease Father    Heart Disease before age 19   Diabetes Father    Hyperlipidemia Father    Hypertension Father    Heart attack Father    Deep vein thrombosis Father    AAA (abdominal aortic aneurysm) Father    Hypertension Mother    Deep vein thrombosis Mother    AAA (abdominal aortic aneurysm) Mother    Cancer Sister    Ovarian   Hypertension Sister    Diabetes Paternal Grandmother    Heart disease Paternal Uncle    Diabetes Paternal Uncle      Heart attack Paternal Grandfather 70   died of MI at 51   Diabetes Paternal Aunt    Diabetes Paternal Uncle    Colon cancer Neg Hx   Social History: reports that she has never smoked. She has never used smokeless tobacco. She reports that she does not drink alcohol or use drugs.  Allergies:       Allergies  Allergen Reactions   Amoxicillin Shortness Of Breath and Rash   Brilinta [Ticagrelor] Shortness Of Breath   Erythromycin Shortness Of Breath, Other (See Comments) and Hives    Trouble swallowing   Flagyl [Metronidazole] Shortness Of Breath and Palpitations   Penicillins Hives, Shortness Of Breath, Rash and Other (See Comments)    Has patient had a PCN reaction causing immediate rash, facial/tongue/throat swelling, SOB or  lightheadedness with hypotension: Yes  Has patient had a PCN reaction causing severe rash involving mucus membranes or skin necrosis: No  Has patient had a PCN reaction that required hospitalization: Yes  Has patient had a PCN reaction occurring within the last 10 years: No  If all of the above answers are "NO", then may proceed with Cephalosporin use.    Isosorbide Mononitrate [Isosorbide Nitrate] Other (See Comments)    Joint aches, muscles hurt, difficult to walk    Jardiance [Empagliflozin] Other (See Comments)    Nausea, joint aches, muscles aches   Metformin And Related Other (See Comments)    Stomach pain, cold sweats, joint pain, burred vision, dizziness   Tape Other (See Comments)    Skin pulls off with certain types  Plastic tape causes skin to rip if left on for long periods of time   Erythromycin Base Rash         Medications Prior to Admission  Medication Sig Dispense Refill   ADVAIR HFA 115-21 MCG/ACT inhaler Inhale 2 puffs into the lungs.     albuterol (VENTOLIN HFA) 108 (90 Base) MCG/ACT inhaler Inhale 2 puffs into the lungs every 6 (six) hours as needed for wheezing.     Alirocumab (PRALUENT) 150 MG/ML SOAJ Inject 1 pen  into the skin every 14 (fourteen) days. 2 pen 11   ALPRAZolam (XANAX) 0.5 MG tablet Take 1 tablet (0.5 mg total) by mouth 2 (two) times daily. 60 tablet 0   amLODipine (NORVASC) 10 MG tablet Take 10 mg by mouth daily.     apixaban (ELIQUIS) 5 MG TABS tablet Take 1 tablet (5 mg total) by mouth 2 (two) times daily. 60 tablet 6   bisacodyl (DULCOLAX) 5 MG EC tablet Take 1 tablet (5 mg total) by mouth daily as needed for moderate constipation. 30 tablet 1   budesonide (PULMICORT) 0.25 MG/2ML nebulizer solution Take 2 mLs (0.25 mg total) by nebulization 2 (two) times daily. 60 mL 12   buPROPion (WELLBUTRIN XL) 150 MG 24 hr tablet Take 150 mg by mouth daily.     clopidogrel (PLAVIX) 75 MG tablet Take 1 tablet (75 mg total) by mouth at bedtime. 90 tablet 1   denosumab (PROLIA) 60 MG/ML SOSY injection Inject 69 mg into the skin every 6 (six) months.     DULoxetine (CYMBALTA) 30 MG capsule Take 1 capsule (30 mg total) by mouth daily.  3   ezetimibe (ZETIA) 10 MG tablet Take 1 tablet (10 mg total) by mouth at bedtime.     gabapentin (NEURONTIN) 300 MG capsule Take 2 capsules (600 mg total) by mouth 2 (two) times daily.     insulin aspart (NOVOLOG) 100 UNIT/ML injection Inject 8 Units into the skin 3 (three) times daily with meals. (Patient taking differently: Inject 20 Units into the skin 3 (three) times daily. ) 10 mL 11   insulin glargine (LANTUS) 100 UNIT/ML injection Inject 0.35 mLs (35 Units total) into the skin at bedtime. (Patient taking differently: Inject 38 Units into the skin at bedtime. ) 10 mL 11   ipratropium-albuterol (DUONEB) 0.5-2.5 (3) MG/3ML SOLN Take 3 mLs by nebulization 2 (two) times daily. (Patient taking differently: Take 3 mLs by nebulization 2 (two) times daily as needed (shortness of breath). ) 360 mL    metolazone (ZAROXOLYN) 2.5 MG tablet Take 2.5 mg by mouth daily.     Multiple Vitamins-Minerals (HEALTHY EYES/LUTEIN) TABS Take 1 tablet by mouth daily.      nitroGLYCERIN (NITROSTAT) 0.3  MG SL tablet Place 1 tablet (0.3 mg total) under the tongue every 5 (five) minutes x 3 doses as needed for chest pain. 9 tablet 1   ondansetron (ZOFRAN) 4 MG tablet Take 4 mg by mouth every 6 (six) hours as needed for nausea/vomiting.     pantoprazole (PROTONIX) 40 MG tablet Take 1 tablet (40 mg total) by mouth daily.     ranolazine (RANEXA) 1000 MG SR tablet Take 1,000 mg by mouth every 12 (twelve) hours.     rosuvastatin (CRESTOR) 40 MG tablet Take 1 tablet (40 mg total) by mouth at bedtime.     torsemide (DEMADEX) 20 MG tablet Take 1 tablet (20 mg total) by mouth every other day.     triamcinolone cream (KENALOG) 0.1 % Apply 1 application topically daily.     vitamin B-12 (CYANOCOBALAMIN) 1000 MCG tablet Take 1,000 mcg by mouth daily.     zolpidem (AMBIEN) 10 MG tablet Take 10 mg by mouth every evening.     metoprolol succinate (TOPROL-XL) 50 MG 24 hr tablet Take 25mg  daily with or immediately following a meal.     oxyCODONE (OXY IR/ROXICODONE) 5 MG immediate release tablet Take 1 tablet (5 mg total) by mouth every 4 (four) hours as needed for moderate pain or severe pain. (Patient not taking: Reported on 09/09/2018) 40 tablet 0   OXYGEN Inhale 2 L into the lungs at bedtime.      spironolactone (ALDACTONE) 25 MG tablet Take 1 tablet (25 mg total) by mouth daily. (Patient not taking: Reported on 09/09/2018)     traMADol (ULTRAM) 50 MG tablet Take 1 tablet (50 mg total) by mouth every 6 (six) hours as needed for moderate pain. (Patient not taking: Reported on 09/09/2018) 40 tablet 0  Drug Regimen Review  Drug regimen was reviewed and remains appropriate with no significant issues identified  Home:  Home Living  Family/patient expects to be discharged to:: Private residence  Living Arrangements: Spouse/significant other  Available Help at Discharge: Family, Available 24 hours/day  Type of Home: House  Home Access: Level entry  Home Layout: Two level,  Bed/bath upstairs  Alternate Level Stairs-Number of Steps: has a stair lift  Bathroom Shower/Tub: Tourist information centre manager: Standard  Bathroom Accessibility: No  Home Equipment: Environmental consultant - 4 wheels, Shower seat  Lives With: Spouse  also going home with spouse/daughter- Alice  Functional History:  Prior Function  Level of Independence: Needs assistance  Gait / Transfers Assistance Needed: Uses rollator for ambuation  ADL's / Homemaking Assistance Needed: husband dries her back side and supervises shower transfer, pt can dress, husband does cooking and pt has a housekeeper  Comments: Has been doing [place] services. reports her balance is no great and she falls a lot.  Functional Status:  Mobility:  Bed Mobility  Overal bed mobility: Needs Assistance  Bed Mobility: Supine to Sit  Supine to sit: Min assist  Sit to supine: Mod assist  General bed mobility comments: use of rail and increased time and effort needed; assist to bring hips to EOB with use of bed pad  Transfers  Overall transfer level: Needs assistance  Equipment used: Rolling walker (2 wheeled)  Transfers: Sit to/from Stand  Sit to Stand: Min guard  Stand pivot transfers: Mod assist  General transfer comment: min guard for safety from EOB and recliner; cues for safe hand placement  Ambulation/Gait  Ambulation/Gait assistance: Min assist, Mod assist  Gait Distance (Feet): (60 ft then ~6 ft)  Assistive  device: Rolling walker (2 wheeled)  Gait Pattern/deviations: Step-through pattern, Step-to pattern, Decreased stride length  General Gait Details: pt with decreased cadence and stride length; initial gait bout pt demonstrates R LE weakness and near buckling and after seate rest break pt agreeable to attempt further ambulation however bilat LE weakness was significant and tolerated only ~6 ft; chair follow for safety  Gait velocity: decreased  ADL:  ADL  Overall ADL's : Needs assistance/impaired  Eating/Feeding: Independent,  Sitting  Grooming: Set up, Sitting  Upper Body Bathing: Minimal assistance, Sitting  Lower Body Bathing: Minimal assistance, Sit to/from stand  Upper Body Dressing : Minimal assistance, Sitting  Lower Body Dressing: Minimal assistance, Sit to/from stand  Toilet Transfer: Minimal assistance, Ambulation, RW  Functional mobility during ADLs: Minimal assistance  Cognition:  Cognition  Overall Cognitive Status: Within Functional Limits for tasks assessed  Arousal/Alertness: Awake/alert  Orientation Level: Oriented X4  Memory: Impaired  Memory Impairment: Retrieval deficit, Decreased short term memory, Other (comment)(baseline deficits)  Awareness: Appears intact  Problem Solving: Appears intact  Safety/Judgment: Appears intact  Cognition  Arousal/Alertness: Awake/alert  Behavior During Therapy: WFL for tasks assessed/performed  Overall Cognitive Status: Within Functional Limits for tasks assessed  Physical Exam:  Blood pressure (!) 151/69, pulse 65, temperature 97.6 F (36.4 C), temperature source Axillary, resp. rate 18, height 5' 2.5" (1.588 m), weight 116.2 kg, SpO2 98 %.  Physical Exam  Constitutional: She is oriented to person, place, and time. She appears well-developed and well-nourished.  Obese, but appears to be more in trunk/proximal limbs than distal limbs Slightly halting speech- can understand, but a little slow to process/react to questions HENT:  Head: Normocephalic and atraumatic.  Nose: Nose normal.  Mouth/Throat: Oropharynx is clear and moist.  Eyes: Pupils are equal, round, and reactive to light. EOM are normal. Right eye exhibits no discharge. Left eye exhibits no discharge. No scleral icterus.  Wearing eyeglasses  Neck: Normal range of motion. Neck supple. No tracheal deviation present.  Cardiovascular: Normal rate and normal heart sounds. Exam reveals no gallop and no friction rub.  No murmur heard. Sounds RRR at this moment in spite of intermittent A Fib- no  M/R/G  Respiratory: Breath sounds normal. No respiratory distress. She has no wheezes. She has no rales. She exhibits no tenderness.  Decreased air movement B/L R>L esp at bases, but diffuse overall.  GI: Soft. Bowel sounds are normal. She exhibits no distension. There is no abdominal tenderness.  Large bruise on R mid abdomen- purple/yellow LBM yesterday Soft, NT, ND, protuberant/obese  Genitourinary: Genitourinary Comments: Has pure-wick external catheter  Musculoskeletal:  General: Edema (legs swollen/edematous above feet) present.  Comments: UEs- deltoids 4+/5, otherwise 5-/5 distally B/L  LEs HF ~3/5 in HFs B/L Distally 4 to 4+/5 in LEs B/L Decreased ROM of L ankle  Neurological: She is alert and oriented to person, place, and time.  Patient is alert makes good eye contact with examiner. Follows full commands. She is alert and oriented to person, place and time. [place]. Halting speech Smile equal, but mouth wider/droopy on R at rest EOMI B/L No facial sensory changes or motor changes otherwise  Skin: Skin is warm. Rash (has psoriatic type rash above elbow on LUE) noted.  Extremely large bruise on L inner thigh and large almost hematoma on R upper back from fall- actually concave from back. Purple- no drainage  Psychiatric: She has a normal mood and affect. Her behavior is normal. Judgment and thought content normal.  Lab Results Last 48 Hours  Imaging Results (Last 48 hours)    Medical Problem List and Plan:  1. Decreased functional mobility secondary to subacute to chronic right frontal lobe infarction/acute respiratory failure with sepsis/lobar pneumonia  2. Antithrombotics:  -DVT/anticoagulation: Continue Eliquis  -antiplatelet therapy: Continue Plavix 75 mg daily  3. Pain Management: Neurontin 600 mg twice daily, continue oxycodone 5 mg every 6 hours as needed moderate to severe pain/chronic back pain  4. Mood: Cymbalta 30 mg daily, Xanax 0.5 mg twice daily, Wellbutrin 150 mg daily. Provide emotional support  -antipsychotic agents: N/A  5. Neuropsych: This patient is capable of making decisions on her own behalf.  6. Skin/Wound Care: Routine skin checks  7. Fluids/Electrolytes/Nutrition: Routine in and outs with follow-up chemistries  8. Diabetes mellitus. Hemoglobin A1c 7.0. NovoLog 4 units 3 times daily, Lantus insulin 28 units nightly.  9. ID /MRSA PCR screening positive. Contact precautions. Levaquin 500 mg daily x3 doses for pneumonia  10. CKD stage III. Admission creatinine 1.13 with baseline 1.39-1.95.  11. Asthma/OSA. Patient not on CPAP- refuses. Continue Dulera as well as nebulizer. Check oxygen saturations every shift as patient was using oxygen at night as needed prior to admission  12. Hypertension. Toprol-XL 50 mg daily, Ranexa 1000 mg every 12 hours Norvasc 10 mg daily  13. Hypokalemia. Follow-up chemistries- currently 3.8  14. CAD with CABG. Continue Plavix. No increasing chest pain  15. Diastolic congestive heart failure. Monitor for any signs of fluid overload.. Continue Zaroxolyn 2.5 mg daily as well as fluid restriction- due to Lasix dosing, likely, pt's CO2 level up to 42- will con't to monitor her CO2  levels on BMP and treat as necessary.  16. Hyperlipidemia. Zetia/Crestor  17. Urinary issues- pt would like to try/keep purewick external catheter if possible- will check  18. Psoriasis? Vs eczema- can con't current steroid cream or try Betamethasone cream BID on LUE to help with rash.  19. Dispo- d/c home with husband/daughter  Cathlyn Parsons, PA-C  09/13/2018

## 2018-09-14 NOTE — Progress Notes (Signed)
[facility] [place] PHYSICAL MEDICINE & REHABILITATION PROGRESS NOTE   Subjective/Complaints:    Objective:   No results found. Recent Labs    09/13/18 0317 09/14/18 0538  WBC 10.9* 11.3*  HGB 10.9* 12.3  HCT 33.9* 38.6  PLT 191 196   Recent Labs    09/14/18 0538 09/14/18 1242  NA 134* 131*  K 2.6* 3.3*  CL 75* 74*  CO2 42* 39*  GLUCOSE 178* 281*  BUN 29* 28*  CREATININE 1.61* 1.72*  CALCIUM 9.6 9.9    Intake/Output Summary (Last 24 hours) at 09/14/2018 1412 Last data filed at 09/14/2018 0800 Gross per 24 hour  Intake 180 ml  Output -  Net 180 ml     Physical Exam: Vital Signs Blood pressure 140/67, pulse 64, temperature (!) 97.4 F (36.3 C), temperature source Oral, resp. rate 16, SpO2 98 %. Constitutional: No distress . Vital signs reviewed. HEENT: EOMI, oral membranes moist Neck: supple Cardiovascular: RRR without murmur. No JVD    Respiratory: CTA Bilaterally without wheezes or rales. Normal effort. O2NC  GI: BS +, non-tender, non-distended, bruises on abdomen  Musculoskeletal:  General: Edema (legs swollen/edematous above feet) present--stable Neuro: mild left facial droop. Speech generally intelligible. Sl dysarthric. UE 4/5 prox to distal. LE 3/5 HF, KE and 4-/5 ADF/PF. No focal sensory changes.    Skin: Skin is warm. Rash (has psoriatic type rash above elbow on LUE) noted.  Left thigh, right back bruising Psychiatric: Pleasant and appropriate   Assessment/Plan: 1. Functional deficits secondary to debility after pneumonia, hx of right frontal CVA which require 3+ hours per day of interdisciplinary therapy in a comprehensive inpatient rehab setting.  Physiatrist is providing close team supervision and 24 hour management of active medical problems listed below.  Physiatrist and rehab team continue to assess barriers to discharge/monitor patient progress toward functional and medical goals  Care Tool:  Bathing              Bathing assist        Upper Body Dressing/Undressing Upper body dressing        Upper body assist      Lower Body Dressing/Undressing Lower body dressing            Lower body assist       Toileting Toileting    Toileting assist Assist for toileting: Moderate Assistance - Patient 50 - 74%     Transfers Chair/bed transfer  Transfers assist     Chair/bed transfer assist level: Minimal Assistance - Patient > 75%     Locomotion Ambulation   Ambulation assist      Assist level: Minimal Assistance - Patient > 75% Assistive device: Walker-rolling Max distance: 25   Walk 10 feet activity   Assist     Assist level: Minimal Assistance - Patient > 75% Assistive device: Walker-rolling   Walk 50 feet activity   Assist Walk 50 feet with 2 turns activity did not occur: Safety/medical concerns         Walk 150 feet activity   Assist Walk 150 feet activity did not occur: Safety/medical concerns         Walk 10 feet on uneven surface  activity   Assist Walk 10 feet on uneven surfaces activity did not occur: Safety/medical concerns         Wheelchair     Assist   Type of Wheelchair: Manual    Wheelchair assist level: Minimal Assistance - Patient > 75% Max wheelchair distance: 75  Wheelchair 50 feet with 2 turns activity    Assist        Assist Level: Minimal Assistance - Patient > 75%   Wheelchair 150 feet activity     Assist  Wheelchair 150 feet activity did not occur: Safety/medical concerns       Blood pressure 140/67, pulse 64, temperature (!) 97.4 F (36.3 C), temperature source Oral, resp. rate 16, SpO2 98 %.  Medical Problem List and Plan:  1. Decreased functional mobility secondary to subacute to chronic right frontal lobe infarction/acute respiratory failure with sepsis/lobar pneumonia   -Patient is beginning CIR therapies today including PT and OT  2. Antithrombotics:  -DVT/anticoagulation: Continue Eliquis   -antiplatelet therapy: Continue Plavix 75 mg daily  3. Pain Management: Neurontin 600 mg twice daily, continue oxycodone 5 mg every 6 hours as needed moderate to severe pain/chronic back pain  4. Mood: Cymbalta 30 mg daily, Xanax 0.5 mg twice daily, Wellbutrin 150 mg daily. Provide emotional support  -antipsychotic agents: N/A  5. Neuropsych: This patient is capable of making decisions on her own behalf.  6. Skin/Wound Care: Routine skin checks  7. Fluids/Electrolytes/Nutrition: Routine in and outs with follow-up chemistries  8. Diabetes mellitus. Hemoglobin A1c 7.0. NovoLog 4 units 3 times daily, Lantus insulin 28 units nightly.   -poor control so far, monitor for pattern, adjust lantus to 32 u tonight 9. ID /MRSA PCR screening positive. Contact precautions. Levaquin 500 mg daily x3 doses for pneumonia    -wbc's 11k 10. CKD stage III. Admission creatinine 1.13 with baseline 1.39-1.95. ---1.72 this afternoon. 11. Asthma/OSA. Patient not on CPAP- refuses. Continue Dulera as well as nebulizer.   -pt used oxygen PTA  -on 2l currently  12. Hypertension. Toprol-XL 50 mg daily, Ranexa 1000 mg every 12 hours Norvasc 10 mg daily  13. Hypokalemia. K+ 2.8 today, kdur 6meq tid ordered with f/u bmet at 1300 today demonstrating improvement to 3.3!  -continue kdur 82meq bid only  14. CAD with CABG. Continue Plavix. No increasing chest pain  15. Diastolic congestive heart failure. Monitor for any signs of fluid overload.. Continue Zaroxolyn 2.5 mg daily as well as fluid restriction-    Need daily weights 16. Hyperlipidemia. Zetia/Crestor  17. Urinary issues- pt would like to try/keep purewick external catheter if possible- will check  18. Psoriasis? Vs eczema- can con't current steroid cream or try Betamethasone cream BID on LUE to help with rash.        LOS: 1 days A FACE TO [place] 09/14/2018, 2:12 PM

## 2018-09-14 NOTE — Progress Notes (Signed)
Physical Therapy Session Note  Patient Details  Name: [name] [name] [name]: [id number] Date of Birth: [birth year]  Today's Date: 09/14/2018 PT Individual Time: 4098-1191 PT Individual Time Calculation (min): 60 min   Short Term Goals: Week 1:  PT Short Term Goal 1 (Week 1): Pt will ambulate 4ft with LRAD and CGA PT Short Term Goal 2 (Week 1): Pt will perform bed mobility with CGA. PT Short Term Goal 3 (Week 1): Pt will maintain standing balance with CGA x 3 min with functional activity PT Short Term Goal 4 (Week 1): Pt will propell WC 124ft with supervision assist  Skilled Therapeutic Interventions/Progress Updates:   Pt in w/c and agreeable to therapy, no c/o pain. Pt self-propelled w/c towards therapy gym w/ supervision using BUEs to work on endurance training, very slow speed but able to perform w/o assist. Seated rest break before stand pivot to RW w/ min assist using RW. NuStep 5 min x2 @ level 3 for global strengthening and endurance training. 6/10 on RPE scale, educated on use of RPE scale to monitor fatigue levels. Worked on functional endurance w/ gait, ambulated 15' and 25' x2, w/ CGA. Seated rest breaks 2/2 fatigue and pt c/o feeling "wobbly". Vitals WNL, verbal cues for breathing strategies. During last walk, pt needed mod assist to return safely to chair 2/2 BLEs slowly starting to buckle. Pt stated she felt "wobbly" again and RN requesting to perform breathing treatment. Returned to room total assist in w/c. RN present performing treatment and vitals monitored, pt c/o chest "tightness" but denied true chest pain. HR in 60s bpm and oxygen >99% on 1 L/min. Taken off oxygen and patient maintain saturations, stated she felt better after breathing treatment. O2 >97% in stance on RA as well. Ended session in w/c, all needs in reach. RN agreeable to pt staying OOB and on RA at this time.   Therapy Documentation Precautions:  Precautions Precautions: Fall Precaution Comments: hx of falls at  home (~8 in last 6 months) Restrictions Weight Bearing Restrictions: No  Therapy/Group: Individual Therapy  Deniyah Dillavou Clent Demark 09/14/2018, 11:58 AM

## 2018-09-14 NOTE — Progress Notes (Signed)
Post Admission Physician Evaluation: 1. Functional deficits secondary  to debility due to pneumonia, subacute CVA, and previous ankle fx, now WBAT with impaired ROM of L ankle.. 2. Patient is admitted to receive collaborative, interdisciplinary care between the physiatrist, rehab nursing staff, and therapy team. 3. Patient's level of medical complexity and substantial therapy needs in context of that medical necessity cannot be provided at a lesser intensity of care such as a SNF. 4. Patient has experienced substantial functional loss from his/her baseline which was documented above under the "Functional History" and "Functional Status" headings.  Judging by the patient's diagnosis, physical exam, and functional history, the patient has potential for functional progress which will result in measurable gains while on inpatient rehab.  These gains will be of substantial and practical use upon discharge  in facilitating mobility and self-care at the household level. 5. Physiatrist will provide 24 hour management of medical needs as well as oversight of the therapy plan/treatment and provide guidance as appropriate regarding the interaction of the two. 6. The Preadmission Screening has been reviewed and patient status is unchanged unless otherwise stated above. 7. 24 hour rehab nursing will assist with bladder management, skin/wound care, disease management and medication administration  and help integrate therapy concepts, techniques,education, etc. 8. PT will assess and treat for/with: weakness, decreased ROM, and endurance with strengthening, mobility, and endurance training.   Goals are: CGA. 9. OT will assess and treat for/with: ADLs, UB strengthening, ROM, and possible estim for subacute CVA.   Goals are: CGA- min assist. Therapy can proceed with showering this patient. 10. SLP will assess and treat for/with: possibly for cognition and slowed processing.  Goals are: supervision. 11. Case Management and  Social Worker will assess and treat for psychological issues and discharge planning. 12. Team conference will be held weekly to assess progress toward goals and to determine barriers to discharge. 13. Patient will receive at least 3 hours of therapy per day at least 5 days per week. 14. ELOS: 14-18 days       15. Prognosis:  good

## 2018-09-14 NOTE — Progress Notes (Signed)
Lab called to report critical potassium level of 2.6. Marlowe [place] NP notified.

## 2018-09-14 NOTE — Evaluation (Signed)
Occupational Therapy Assessment and Plan  Patient Details  Name: OCEANA WALTHALL [name]: [id number] Date of Birth: [birth year]  OT Diagnosis: abnormal posture and muscle weakness (generalized) Rehab Potential: Rehab Potential (ACUTE ONLY): Excellent ELOS: 7-9 days   Today's Date: 09/14/2018 OT Individual Time: 4034-7425 OT Individual Time Calculation (min): 77 min     Problem List:  Patient Active Problem List   Diagnosis Date Noted  . Ischemic cerebrovascular accident (CVA) of frontal lobe ([place]) 09/13/2018  . Fall 09/09/2018  . Stroke ([place]) 09/09/2018  . Type II diabetes mellitus with renal manifestations ([place]) 09/09/2018  . CKD (chronic kidney disease), stage III ([place]) 09/09/2018  . Sepsis ([place]) 09/09/2018  . Depression with anxiety 09/09/2018  . Slurred speech 09/09/2018  . Ankle fracture, left 05/16/2018  . Amiodarone pulmonary toxicity   . Physical deconditioning   . 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 Acute on chronic congestive heart failure ([place])   . Chronic systolic CHF (congestive heart failure) ([place]) 01/16/2018  . Acute on chronic diastolic CHF (congestive heart failure) ([place]) 01/16/2018  . Acute on chronic diastolic (congestive) heart failure ([place]) 01/16/2018  . Atrial fibrillation ([place]) 10/21/2017  . CAD (coronary artery disease) of bypass graft 10/20/2017  . Coronary artery disease of bypass graft of native heart with stable angina pectoris ([place])   . Chronic low back pain 08/24/2016  . Pain of left thumb 08/05/2016  . Nocturnal hypoxia 06/02/2016  . Hoarseness 04/05/2016  . Laryngopharyngeal reflux (LPR) 04/05/2016  . Pharyngoesophageal dysphagia 04/05/2016  . Angina decubitus ([place]) 05/22/2015  . Chronic insomnia 05/06/2015  . Common migraine 05/14/2014  . Abnormality of gait 05/14/2014  . Memory change 05/14/2014  . Aneurysm, cerebral, nonruptured 05/14/2014  . Cramp of limb-Left neck 05/10/2014  . Hematemesis with nausea   . Vomiting blood   . Dizziness and  giddiness 09/21/2013  . Atypical chest pain 07/18/2013  . Unstable angina ([place]) 04/09/2013  . Carotid artery stenosis, symptomatic 03/20/2013  . Chronic diastolic CHF (congestive heart failure) ([place]) 09/23/2012  . Cerebrovascular disease 07/10/2012  . Cerebral artery occlusion with cerebral infarction ([place]) 07/10/2012  . Mitral regurgitation 04/15/2012  . TIA (transient ischemic attack) 04/15/2012  . Occlusion and stenosis of carotid artery without mention of cerebral infarction 08/18/2011  . Bariatric surgery status 06/17/2011  . Speech abnormality 03/22/2011  . Dyspnea 02/24/2011  . PAPILLARY MUSCLE DYSFUNCTION, NON-RHEUMATIC 10/09/2008  . UNSPECIFIED VITAMIN D DEFICIENCY 10/24/2007  . MYOCARDIAL INFARCTION, HX OF 10/24/2007  . PERSISTENT VOMITING 10/24/2007  . OSTEOARTHRITIS 10/24/2007  . MIGRAINES, HX OF 10/24/2007  . DM 01/16/2007  . Hyperlipemia 01/16/2007  . Obesity-post failed open gastroplasty 1984  01/16/2007  . OBSTRUCTIVE SLEEP APNEA 01/16/2007  . Essential hypertension 01/16/2007  . Coronary atherosclerosis 01/16/2007  . Asthma 01/16/2007  . GERD 01/16/2007  . VENTRAL HERNIA 01/16/2007    Past Medical History:  Past Medical History:  Diagnosis Date  . Abnormality of gait 05/14/2014  . Anemia    hx  . Anginal pain ([place])   . Anxiety   . Asthma   . Basal cell carcinoma 05/2014   "left shoulder"  . Bundle branch block, left    chronic/notes 07/18/2013  . CHF (congestive heart failure) ([place])   . Chronic bronchitis ([place])    "off and on all the time" (07/18/2013)  . Chronic insomnia 05/06/2015  . Chronic kidney disease    frequency, sees dr Jamal Maes every 4 to 6 months (01/16/2018)  . Chronic low back pain  08/24/2016  . Chronic lower back pain   . Claustrophobia   . Common migraine 05/14/2014  . Coronary artery disease   . Depression   . Dysrhythmia   . GERD (gastroesophageal reflux disease)   . H/O hiatal hernia   . Headache    "at least 2/month"  (01/16/2018)  . Heart murmur   . Hyperlipidemia   . Hypertension   . Irregular heart beat   . Leg cramps    both legs at times  . Melanoma ([place]) 05/2014   "burned off BLE" (01/16/2018)  . Memory change 05/14/2014  . Migraine    "5-6/year"  (01/16/2018)  . Myocardial infarction ([place]) 04/1999, 02/2000, 01/2005; 2011; 2014  . Neuromuscular disorder ([place])    ?  . Obesity 01-2010  . Obstructive sleep apnea    "can't wear machine; I have claustrophobia" (01/16/2018), states she had a 2nd sleep study and does not have sleep apnea, her O2 decreases and now is on 2 L of O2 at night.  . On home oxygen therapy    "2L at night and prn during daytime" (01/16/2018)  . Osteoarthritis    "knees and hands" (01/16/2018)  . Other and unspecified angina pectoris   . Peripheral vascular disease ([facility])    ? numbness, tingling arms and legs  . Pneumonia 2000's   "once"  . PONV (postoperative nausea and vomiting)   . Shortness of breath    with exertion  . Stroke ([place]) 03/22/12   right side brain; denies residual on 07/18/2013)  . Stroke [facility]) Oct. 2015   Affected pt.s balance  . Stroke [facility]) 10/2014   "affected my legs; fully recovered now"; still have sporatic memory issues from this one" (01/16/2018)  . Type II diabetes mellitus ([place])   . Ventral hernia    hx of  . Vomiting    persistent  . Vomiting blood    Past Surgical History:  Past Surgical History:  Procedure Laterality Date  . ANKLE FRACTURE SURGERY Left 1970's  . APPENDECTOMY  1970's   w/hysterectomy  . BASAL CELL CARCINOMA EXCISION Left 05/2014   "shoulder" (01/16/2018)  . CARDIAC CATHETERIZATION  10/10/2012   Dr [name].  Marland Kitchen CARDIAC CATHETERIZATION N/A 05/29/2015   Procedure: Right/Left Heart Cath and Coronary/Graft Angiography;  Surgeon: Larey Dresser, MD;  Location: [facility];  Service: Cardiovascular;  Laterality: N/A;  . CAROTID ENDARTERECTOMY Left 03/2013  . CAROTID STENT INSERTION Left 03/20/2013   Procedure:  CAROTID STENT INSERTION;  Surgeon: [name], MD;  Location[facility] C[facility] LAB;  Service: Cardiovascular;  Laterality: Left;  internal carotid  . CEREBRAL ANGIOGRAM N/A 04/05/2011   Procedure: CEREBRAL ANGIOGRAM;  Surgeon: Angelia Mould, MD;  Location: [facility] C[facility] LAB;  Service: Cardiovascular;  Laterality: N/A;  . CHOLECYSTECTOMY OPEN  2004  . CORONARY ANGIOPLASTY WITH STENT PLACEMENT  01,02,05,06,07,08,11; 04/24/2013   "I've probably got ~ 10 stents by now" (04/24/2013)  . CORONARY ANGIOPLASTY WITH STENT PLACEMENT  06/13/2013   "got 4 stents today" (06/13/2013)  . CORONARY ARTERY BYPASS GRAFT  1220/11   "CABG X5"  . CORONARY STENT INTERVENTION N/A 10/20/2017   Procedure: CORONARY STENT INTERVENTION;  Surgeon: Troy Sine, MD;  Location: [place] [facility] CV LAB;  Service: Cardiovascular;  Laterality: N/A;  . ESOPHAGOGASTRODUODENOSCOPY  08/03/2011   Procedure: ESOPHAGOGASTRODUODENOSCOPY (EGD);  Surgeon: Shann Medal, MD;  Location[facility];  Service: General;  Laterality: N/A;  . ESOPHAGOGASTRODUODENOSCOPY (EGD) WITH PROPOFOL N/A 03/11/2014   Procedure: ESOPHAGOGASTRODUODENOSCOPY (  EGD) WITH PROPOFOL;  Surgeon: Lafayette Dragon, MD;  Location: [facility] ENDOSCOPY;  Service: Endoscopy;  Laterality: N/A;  . FRACTURE SURGERY    . gall stone removal  05/2003  . GASTRIC RESTRICTION SURGERY  1984   "stapeling"  . HERNIA REPAIR  2004   "in my stomach; had OR on it twice", wire mesh on 1 hernia  . LEFT HEART CATH AND CORS/GRAFTS ANGIOGRAPHY N/A 07/09/2016   Procedure: LEFT HEART CATH AND CORS/GRAFTS ANGIOGRAPHY;  Surgeon: Larey Dresser, MD;  Location: [place] [facility];  Service: Cardiovascular;  Laterality: N/A;  . ORIF ANKLE FRACTURE Left 05/16/2018  . ORIF ANKLE FRACTURE Left 05/16/2018   Procedure: OPEN REDUCTION INTERNAL FIXATION (ORIF) Left ankle with possible syndesmosis fixation;  Surgeon: Nicholes Stairs, MD;  Location: [facility];  Service: Orthopedics;  Laterality: Left;  165mn  . OVARY SURGERY   1970's   "tumor removed"  . PERCUTANEOUS CORONARY STENT INTERVENTION (PCI-S) N/A 06/13/2013   Procedure: PERCUTANEOUS CORONARY STENT INTERVENTION (PCI-S);  Surgeon: JJettie Booze MD;  [facility];  Service: Cardiovascular;  Laterality: N/A;  . PERCUTANEOUS STENT INTERVENTION N/A 04/24/2013   Procedure: PERCUTANEOUS STENT INTERVENTION;  Surgeon: JJettie Booze MD;  [facility];  Service: Cardiovascular;  Laterality: N/A;  . RIGHT/LEFT HEART CATH AND CORONARY/GRAFT ANGIOGRAPHY N/A 10/20/2017   Procedure: RIGHT/LEFT HEART CATH AND CORONARY/GRAFT ANGIOGRAPHY;  Surgeon: MLarey Dresser MD;  [facility];  Service: Cardiovascular;  Laterality: N/A;  . ROOT CANAL  10/2000  . TIBIA FRACTURE SURGERY Right 1970's   rods and pins  . TOOTH EXTRACTION     "1 on the upper; wisdom tooth on the lower" (01/16/2018)  . TOTAL ABDOMINAL HYSTERECTOMY  1970's   w/ appendectomy    Assessment & Plan Clinical Impression: Patient is a 73y.o. year old female with recent admission to the hospital on 09/09/2018 with transient slurred speech, dizziness, vomiting as well as fall x3 without loss of consciousness. Noted bruises to her left upper back and left leg. WBC 14,500, creatinine 1.13, lactic acid 1.2, COVID negative, urinalysis negative, potassium 3.3, low-grade fever 100.9, blood pressure 177/71. Chest x-ray showed cardiomegaly and vascular congestion with mild pulmonary edema as well as developing bronchopneumonia right lower lobe. MRSA PCR screening positive. X-rays of left femur and left rib negative for fracture. CT of the head as well as MRI/MRA showed no acute ischemic infarct or intracranial hemorrhage. Severe intracranial atherosclerosis noted. Severe stenosis of left PCA P1 and P2 segments unchanged from compared to 06/01/2014. Severe stenosis of both internal carotid arteries at the skull base progressed compared to 06/01/2014. Neurology follow-up to address MRI  suspect subacute to chronic right frontal lobe infarct new from prior scan of February 2020..Marland Kitchen Patient transferred to CIR on 09/13/2018 .    Patient currently requires min with basic self-care skills secondary to muscle weakness, decreased cardiorespiratoy endurance and decreased standing balance and decreased balance strategies.  Prior to hospitalization, patient could complete ADLs with supervision.  Patient will benefit from skilled intervention to decrease level of assist with basic self-care skills and increase independence with basic self-care skills prior to discharge home with care partner.  Anticipate patient will require 24 hour supervision and follow up home health.  OT - End of Session Activity Tolerance: Decreased this session Endurance Deficit: Yes OT Assessment Rehab Potential (ACUTE ONLY): Excellent OT Patient demonstrates impairments in the following area(s): Balance;Endurance OT Basic ADL's Functional Problem(s): Toileting;Dressing;Bathing;Grooming OT  Advanced ADL's Functional Problem(s): Simple Meal Preparation OT Transfers Functional Problem(s): Tub/Shower;Toilet OT Additional Impairment(s): None OT Plan OT Intensity: Minimum of 1-2 x/day, 45 to 90 minutes OT Frequency: 5 out of 7 days OT Duration/Estimated Length of Stay: 7-9 days OT Treatment/Interventions: Balance/vestibular training;Discharge planning;Therapeutic Activities;Self Care/advanced ADL retraining;UE/LE Coordination activities;Functional mobility training;Patient/family education;Therapeutic Exercise;Community reintegration;DME/adaptive equipment instruction;Neuromuscular re-education;UE/LE Strength taining/ROM OT Self Feeding Anticipated Outcome(s): independent OT Basic Self-Care Anticipated Outcome(s): supervision OT Toileting Anticipated Outcome(s): supervision OT Bathroom Transfers Anticipated Outcome(s): supervision OT Recommendation Patient destination: Home Follow Up Recommendations: Home health  OT;24 hour supervision/assistance Equipment Recommended: 3 in 1 bedside comode   Skilled Therapeutic Intervention Pt completed selfcare retraining sit to stand at the sink during session.  She was able to perform UB bathing with supervision and UB dressing with min assist.  She was able to complete LB bathing and dressing with min assist.  She exhibited increased bilateral knee flexion in standing as she fatigued requiring min facilitation.  She was able to complete functional transfers to the toilet with min assist using the RW from the wheelchair at the sink.  She needed a rest break secondary to increased fatigue once she reached the toilet.  Oxygen sats were at 93-95% on room air with HR at 86.  Finished session with transfer back to the wheelchair with call button and phone in reach and safety belt in place.  Discussed expectations of ELOS and supervision level goals, with pt in agreement.    OT Evaluation Precautions/Restrictions  Precautions Precautions: Fall Restrictions Weight Bearing Restrictions: No  Pain Pain Assessment Pain Scale: Faces Pain Score: 0-No pain Faces Pain Scale: Hurts a little bit Pain Type: Chronic pain Pain Location: Ankle Pain Orientation: Left Pain Descriptors / Indicators: Discomfort Pain Intervention(s): Repositioned Home Living/Prior Functioning Home Living Available Help at Discharge: Family(spouse and daughter) Type of Home: House Home Access: Stairs to enter Technical brewer of Steps: 1 Entrance Stairs-Rails: None Home Layout: Two level, 1/2 bath on main level, Bed/bath upstairs Alternate Level Stairs-Number of Steps: has a stair lift Alternate Level Stairs-Rails: Can reach both Bathroom Shower/Tub: Multimedia programmer: Handicapped height Bathroom Accessibility: Yes  Lives With: Spouse IADL History Homemaking Responsibilities: No Current License: No Mode of Transportation: Car Prior Function Level of Independence:  Requires assistive device for independence Driving: Yes Vocation: Retired Comments: Has been doing [employer]. reports her balance is not great and she falls a lot. ADL ADL Eating: Independent Where Assessed-Eating: Wheelchair Grooming: Setup Where Assessed-Grooming: Wheelchair Upper Body Bathing: Supervision/safety Where Assessed-Upper Body Bathing: Wheelchair, Sitting at sink Lower Body Bathing: Minimal assistance Where Assessed-Lower Body Bathing: Wheelchair, Sitting at sink Upper Body Dressing: Minimal assistance Where Assessed-Upper Body Dressing: Sitting at sink, Wheelchair Lower Body Dressing: Minimal assistance Where Assessed-Lower Body Dressing: Wheelchair, Sitting at sink, Standing at sink Toileting: Minimal assistance Where Assessed-Toileting: Medical laboratory scientific officer: Minimal Print production planner Method: Counselling psychologist: Raised toilet seat Vision Baseline Vision/History: Wears glasses Wears Glasses: At all times Patient Visual Report: No change from baseline Vision Assessment?: No apparent visual deficits Perception  Perception: Within Functional Limits Praxis Praxis: Intact Cognition Overall Cognitive Status: Within Functional Limits for tasks assessed Arousal/Alertness: Awake/alert Orientation Level: Person;Place;Situation Person: Oriented Place: Oriented Situation: Oriented Year: 2020 Month: August Day of Week: Correct Memory: Appears intact Immediate Memory Recall: Sock;Blue;Bed Memory Recall Sock: Without Cue Memory Recall Blue: Without Cue Memory Recall Bed: With Cue Awareness: Appears intact Problem Solving: Appears intact Safety/Judgment: Appears intact Sensation Sensation Light  Touch: Impaired Detail Peripheral sensation comments: slight decreased acuity in the 5th digit of the right hand compared to the left Hot/Cold: Appears Intact Proprioception: Appears Intact Stereognosis: Appears  Intact Coordination Gross Motor Movements are Fluid and Coordinated: Yes Fine Motor Movements are Fluid and Coordinated: Yes Motor  Motor Motor - Skilled Clinical Observations: generalized weakness Mobility  Transfers Sit to Stand: Minimal Assistance - Patient > 75%  Trunk/Postural Assessment  Cervical Assessment Cervical Assessment: Exceptions to WFL(cervical protraction and flexion) Thoracic Assessment Thoracic Assessment: Exceptions to WFL(thoracic flexion and rounded shoulders) Lumbar Assessment Lumbar Assessment: Exceptions to WFL(flexed trunk in standing) Postural Control Postural Control: Deficits on evaluation  Balance Balance Balance Assessed: Yes Static Sitting Balance Static Sitting - Balance Support: No upper extremity supported Static Sitting - Level of Assistance: 6: Modified independent (Device/Increase time) Dynamic Sitting Balance Dynamic Sitting - Balance Support: No upper extremity supported Dynamic Sitting - Level of Assistance: 5: Stand by assistance Static Standing Balance Static Standing - Balance Support: During functional activity Static Standing - Level of Assistance: 4: Min assist Dynamic Standing Balance Dynamic Standing - Balance Support: During functional activity Dynamic Standing - Level of Assistance: 4: Min assist Extremity/Trunk Assessment RUE Assessment RUE Assessment: Within Functional Limits LUE Assessment LUE Assessment: Within Functional Limits     Refer to Care Plan for Long Term Goals  Recommendations for other services: None    Discharge Criteria: Patient will be discharged from OT if patient refuses treatment 3 consecutive times without medical reason, if treatment goals not met, if there is a change in medical status, if patient makes no progress towards goals or if patient is discharged from hospital.  The above assessment, treatment plan, treatment alternatives and goals were discussed and mutually agreed upon: by  patient  Loredana Medellin OTR/L 09/14/2018, 4:43 PM

## 2018-09-14 NOTE — Evaluation (Signed)
Physical Therapy Assessment and Plan  Patient Details  Name: [name] [name] [name]: [id number] Date of Birth: 04-30-1945  PT Diagnosis: Abnormal posture, Abnormality of gait, Difficulty walking and Muscle weakness Rehab Potential: Good ELOS: 10-12 days   Today's Date: 09/14/2018 PT Individual Time: 0815-0908 PT Individual Time Calculation (min): 53 min    Problem List:  Patient Active Problem List   Diagnosis Date Noted  . Ischemic cerebrovascular accident (CVA) of frontal lobe ([place]) 09/13/2018  . Fall 09/09/2018  . Stroke ([place]) 09/09/2018  . Type II diabetes mellitus with renal manifestations ([place]) 09/09/2018  . CKD (chronic kidney disease), stage III ([place]) 09/09/2018  . Sepsis ([place]) 09/09/2018  . Depression with anxiety 09/09/2018  . Slurred speech 09/09/2018  . Ankle fracture, left 05/16/2018  . Amiodarone pulmonary toxicity   . Physical deconditioning   . 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 Acute on chronic congestive heart failure ([employer])   . Chronic systolic CHF (congestive heart failure) ([place]) 01/16/2018  . Acute on chronic diastolic CHF (congestive heart failure) ([place]) 01/16/2018  . Acute on chronic diastolic (congestive) heart failure ([place]) 01/16/2018  . Atrial fibrillation ([place]) 10/21/2017  . CAD (coronary artery disease) of bypass graft 10/20/2017  . Coronary artery disease of bypass graft of native heart with stable angina pectoris ([place])   . Chronic low back pain 08/24/2016  . Pain of left thumb 08/05/2016  . Nocturnal hypoxia 06/02/2016  . Hoarseness 04/05/2016  . Laryngopharyngeal reflux (LPR) 04/05/2016  . Pharyngoesophageal dysphagia 04/05/2016  . Angina decubitus ([place]) 05/22/2015  . Chronic insomnia 05/06/2015  . Common migraine 05/14/2014  . Abnormality of gait 05/14/2014  . Memory change 05/14/2014  . Aneurysm, cerebral, nonruptured 05/14/2014  . Cramp of limb-Left neck 05/10/2014  . Hematemesis with nausea   . Vomiting blood   . Dizziness and giddiness  09/21/2013  . Atypical chest pain 07/18/2013  . Unstable angina ([place]) 04/09/2013  . Carotid artery stenosis, symptomatic 03/20/2013  . Chronic diastolic CHF (congestive heart failure) ([place]) 09/23/2012  . Cerebrovascular disease 07/10/2012  . Cerebral artery occlusion with cerebral infarction ([place]) 07/10/2012  . Mitral regurgitation 04/15/2012  . TIA (transient ischemic attack) 04/15/2012  . Occlusion and stenosis of carotid artery without mention of cerebral infarction 08/18/2011  . Bariatric surgery status 06/17/2011  . Speech abnormality 03/22/2011  . Dyspnea 02/24/2011  . PAPILLARY MUSCLE DYSFUNCTION, NON-RHEUMATIC 10/09/2008  . UNSPECIFIED VITAMIN D DEFICIENCY 10/24/2007  . MYOCARDIAL INFARCTION, HX OF 10/24/2007  . PERSISTENT VOMITING 10/24/2007  . OSTEOARTHRITIS 10/24/2007  . MIGRAINES, HX OF 10/24/2007  . DM 01/16/2007  . Hyperlipemia 01/16/2007  . Obesity-post failed open gastroplasty 1984  01/16/2007  . OBSTRUCTIVE SLEEP APNEA 01/16/2007  . Essential hypertension 01/16/2007  . Coronary atherosclerosis 01/16/2007  . Asthma 01/16/2007  . GERD 01/16/2007  . VENTRAL HERNIA 01/16/2007    Past Medical History:  Past Medical History:  Diagnosis Date  . Abnormality of gait 05/14/2014  . Anemia    hx  . Anginal pain ([place])   . Anxiety   . Asthma   . Basal cell carcinoma 05/2014   "left shoulder"  . Bundle branch block, left    chronic/notes 07/18/2013  . CHF (congestive heart failure) ([place])   . Chronic bronchitis ([place])    "off and on all the time" (07/18/2013)  . Chronic insomnia 05/06/2015  . Chronic kidney disease    frequency, sees dr Jamal Maes every 4 to 6 months (01/16/2018)  . Chronic low back pain 08/24/2016  .  Chronic lower back pain   . Claustrophobia   . Common migraine 05/14/2014  . Coronary artery disease   . Depression   . Dysrhythmia   . GERD (gastroesophageal reflux disease)   . H/O hiatal hernia   . Headache    "at least 2/month" (01/16/2018)   . Heart murmur   . Hyperlipidemia   . Hypertension   . Irregular heart beat   . Leg cramps    both legs at times  . Melanoma ([place]) 05/2014   "burned off BLE" (01/16/2018)  . Memory change 05/14/2014  . Migraine    "5-6/year"  (01/16/2018)  . Myocardial infarction ([employer]) 04/1999, 02/2000, 01/2005; 2011; 2014  . Neuromuscular disorder ([place])    ?  . Obesity 01-2010  . Obstructive sleep apnea    "can't wear machine; I have claustrophobia" (01/16/2018), states she had a 2nd sleep study and does not have sleep apnea, her O2 decreases and now is on 2 L of O2 at night.  . On home oxygen therapy    "2L at night and prn during daytime" (01/16/2018)  . Osteoarthritis    "knees and hands" (01/16/2018)  . Other and unspecified angina pectoris   . Peripheral vascular disease (H[facility])    ? numbness, tingling arms and legs  . Pneumonia 2000's   "once"  . PONV (postoperative nausea and vomiting)   . Shortness of breath    with exertion  . Stroke ([place]) 03/22/12   right side brain; denies residual on 07/18/2013)  . Stroke [facility]) Oct. 2015   Affected pt.s balance  . Stroke [facility]) 10/2014   "affected my legs; fully recovered now"; still have sporatic memory issues from this one" (01/16/2018)  . Type II diabetes mellitus ([place])   . Ventral hernia    hx of  . Vomiting    persistent  . Vomiting blood    Past Surgical History:  Past Surgical History:  Procedure Laterality Date  . ANKLE FRACTURE SURGERY Left 1970's  . APPENDECTOMY  1970's   w/hysterectomy  . BASAL CELL CARCINOMA EXCISION Left 05/2014   "shoulder" (01/16/2018)  . CARDIAC CATHETERIZATION  10/10/2012   Dr [name].  Marland Kitchen CARDIAC CATHETERIZATION N/A 05/29/2015   Procedure: Right/Left Heart Cath and Coronary/Graft Angiography;  Surgeon: Larey Dresser, MD;  Location: [place] CV LAB;  Service: Cardiovascular;  Laterality: N/A;  . CAROTID ENDARTERECTOMY Left 03/2013  . CAROTID STENT INSERTION Left 03/20/2013   Procedure: CAROTID STENT  INSERTION;  Surgeon: [name], MD;  Location[facility];  Service: Cardiovascular;  Laterality: Left;  internal carotid  . CEREBRAL ANGIOGRAM N/A 04/05/2011   Procedure: CEREBRAL ANGIOGRAM;  Surgeon: Angelia Mould, MD;  Location: [facility] CATH LAB;  Service: Cardiovascular;  Laterality: N/A;  . CHOLECYSTECTOMY OPEN  2004  . CORONARY ANGIOPLASTY WITH STENT PLACEMENT  01,02,05,06,07,08,11; 04/24/2013   "I've probably got ~ 10 stents by now" (04/24/2013)  . CORONARY ANGIOPLASTY WITH STENT PLACEMENT  06/13/2013   "got 4 stents today" (06/13/2013)  . CORONARY ARTERY BYPASS GRAFT  1220/11   "CABG X5"  . CORONARY STENT INTERVENTION N/A 10/20/2017   Procedure: CORONARY STENT INTERVENTION;  Surgeon: Troy Sine, MD;  Location: [place] CV LAB;  Service: Cardiovascular;  Laterality: N/A;  . ESOPHAGOGASTRODUODENOSCOPY  08/03/2011   Procedure: ESOPHAGOGASTRODUODENOSCOPY (EGD);  Surgeon: Shann Medal, MD;  [facility];  Service: General;  Laterality: N/A;  . ESOPHAGOGASTRODUODENOSCOPY (EGD) WITH PROPOFOL N/A 03/11/2014   Procedure: ESOPHAGOGASTRODUODENOSCOPY (EGD) WITH PROPOFOL;  Surgeon: Lafayette Dragon, MD;  Location[facility];  Service: Endoscopy;  Laterality: N/A;  . FRACTURE SURGERY    . gall stone removal  05/2003  . GASTRIC RESTRICTION SURGERY  1984   "stapeling"  . HERNIA REPAIR  2004   "in my stomach; had OR on it twice", wire mesh on 1 hernia  . LEFT HEART CATH AND CORS/GRAFTS ANGIOGRAPHY N/A 07/09/2016   Procedure: LEFT HEART CATH AND CORS/GRAFTS ANGIOGRAPHY;  Surgeon: Larey Dresser, MD;  Location: [place] CV LAB;  Service: Cardiovascular;  Laterality: N/A;  . ORIF ANKLE FRACTURE Left 05/16/2018  . ORIF ANKLE FRACTURE Left 05/16/2018   Procedure: OPEN REDUCTION INTERNAL FIXATION (ORIF) Left ankle with possible syndesmosis fixation;  Surgeon: Nicholes Stairs, MD;  Location: [place];  Service: Orthopedics;  Laterality: Left;  181mn  . OVARY SURGERY  1970's    "tumor removed"  . PERCUTANEOUS CORONARY STENT INTERVENTION (PCI-S) N/A 06/13/2013   Procedure: PERCUTANEOUS CORONARY STENT INTERVENTION (PCI-S);  Surgeon: JJettie Booze MD;  [facility];  Service: Cardiovascular;  Laterality: N/A;  . PERCUTANEOUS STENT INTERVENTION N/A 04/24/2013   Procedure: PERCUTANEOUS STENT INTERVENTION;  Surgeon: JJettie Booze MD;  [facility];  Service: Cardiovascular;  Laterality: N/A;  . RIGHT/LEFT HEART CATH AND CORONARY/GRAFT ANGIOGRAPHY N/A 10/20/2017   Procedure: RIGHT/LEFT HEART CATH AND CORONARY/GRAFT ANGIOGRAPHY;  Surgeon: MLarey Dresser MD;  [facility];  Service: Cardiovascular;  Laterality: N/A;  . ROOT CANAL  10/2000  . TIBIA FRACTURE SURGERY Right 1970's   rods and pins  . TOOTH EXTRACTION     "1 on the upper; wisdom tooth on the lower" (01/16/2018)  . TOTAL ABDOMINAL HYSTERECTOMY  1970's   w/ appendectomy    Assessment & Plan Clinical Impression: Patient is a 73year old right-handed female history of hypertension, chronic back pain maintained on oxycodone as needed type II diabetes mellitus, hyperlipidemia, asthma using oxygen as needed at night, chronic diastolic congestive heart failure with ejection fraction 25%, CVA, CAD with CABG, atrial fibrillation maintained on Eliquis as well as Plavix followed by Dr. DHildred Priest CKD stage III, OSA not on CPAP.  Per chart review patient lives with spouse.  Used a rolling walker for mobility with reports of multiple falls.  Husband daughter and family assist as needed.  Patient had been a skilled nursing facility up until end of July for bouts of pneumonia.  2 level home with bed and bathroom upstairs.  Presented 09/09/2018 with transient slurred speech, dizziness, vomiting as well as fall x3 without loss of consciousness.  Noted bruises to her left upper back and left leg.  WBC 14,500, creatinine 1.13, lactic acid 1.2, COVID negative, urinalysis negative, potassium  3.3, low-grade fever 100.9, blood pressure 177/71.  Chest x-ray showed cardiomegaly and vascular congestion with mild pulmonary edema as well as developing bronchopneumonia right lower lobe.  MRSA PCR screening positive.  X-rays of left femur and left rib negative for fracture.  CT of the head as well as MRI/MRA showed no acute ischemic infarct or intracranial hemorrhage.  Severe intracranial atherosclerosis noted.  Severe stenosis of left PCA P1 and P2 segments unchanged from compared to 06/01/2014.  Severe stenosis of both internal carotid arteries at the skull base progressed compared to 06/01/2014.  Neurology follow-up to address MRI suspect subacute to chronic right frontal lobe infarct new from prior scan of February 2020.  It was advised to continue Eliquis and Plavix as prior to admission.  Patient maintained on vancomycin and cefepime with fevers and leukocytosis improved and changed to Levaquin 812 20203 doses.  She did receive low-dose diuresis monitoring for any signs of hypoxia.  Patient is tolerating a regular consistency diet.  Patient transferred to CIR on 09/13/2018 .   Patient currently requires min with mobility secondary to muscle weakness and muscle joint tightness, decreased cardiorespiratoy endurance, impaired timing and sequencing and unbalanced muscle activation and decreased sitting balance, decreased standing balance, decreased postural control, hemiplegia and decreased balance strategies.  Prior to hospitalization, patient was modified independent  with mobility and lived with Spouse in a House home.  Home access is 1Stairs to enter.  Patient will benefit from skilled PT intervention to maximize safe functional mobility, minimize fall risk and decrease caregiver burden for planned discharge home with 24 hour supervision.  Anticipate patient will benefit from follow up [place] at discharge.  PT - End of Session Activity Tolerance: Tolerates < 10 min activity, no significant change in vital  signs Endurance Deficit: Yes PT Assessment Rehab Potential (ACUTE/IP ONLY): Good PT Barriers to Discharge: [place] home environment;Decreased caregiver support;Home environment access/layout;Medical stability PT Patient demonstrates impairments in the following area(s): Balance;Edema;Endurance;Behavior;Nutrition PT Transfers Functional Problem(s): Bed Mobility;Car;Bed to Chair;Furniture;Floor PT Locomotion Functional Problem(s): Ambulation;Stairs;Wheelchair Mobility PT Plan PT Intensity: Minimum of 1-2 x/day ,45 to 90 minutes PT Frequency: 5 out of 7 days PT Duration Estimated Length of Stay: 10-12 days PT Treatment/Interventions: Ambulation/gait training;Discharge planning;Functional mobility training;Psychosocial support;Therapeutic Activities;Visual/perceptual remediation/compensation;Wheelchair propulsion/positioning;Therapeutic Exercise;Skin care/wound management;Disease management/prevention;Neuromuscular re-education;Balance/vestibular training;Cognitive remediation/compensation;DME/adaptive equipment instruction;Splinting/orthotics;Pain management;UE/LE Strength taining/ROM;UE/LE Coordination activities;Stair training;Patient/family education;Community reintegration PT Transfers Anticipated Outcome(s): Supervision assist with LRAD PT Locomotion Anticipated Outcome(s): SUpervision assist at household level and RW PT Recommendation Follow Up Recommendations: Home health PT Patient destination: Home Equipment Recommended: To be determined  Skilled Therapeutic Intervention Pt received supine in bed and agreeable to PT once she finished breakfast. PT returned in 10 minutes. Supine>sit transfer with min assist  At trunk and min cues decreased use of bed features. PT instructed patient in PT Evaluation and initiated treatment intervention; see below for results. PT educated patient in [place], rehab potential, rehab goals, and discharge recommendations. Gait training with RW as listed below  with intermittent LLE stability. Sit<>stand and stand pivot transfers with min assist min cues for use of AD pursed lip breathing. Pt maintain on 1L/min O2 throughout treatment 95-99% when measured following activity. Patient returned to room and left sitting in [facility] with call bell in reach and all needs met.       PT Evaluation Precautions/Restrictions Precautions Precautions: Fall Precaution Comments: hx of falls at home (~8 in last 6 months) Restrictions Weight Bearing Restrictions: No General   Vital Signs Pain Pain Assessment Pain Scale: 0-10 Pain Score: 0-No pain Home Living/Prior Functioning Home Living Available Help at Discharge: Family(spouse and daughter provide 24/7 supervision) Type of Home: House Home Access: Stairs to enter [employer] of Steps: 1 Entrance Stairs-Rails: None Home Layout: Two level;1/2 bath on main level;Bed/bath upstairs Alternate Level Stairs-Number of Steps: has a stair lift Alternate Level Stairs-Rails: Can reach both Bathroom Shower/Tub: Multimedia programmer: Standard Bathroom Accessibility: Yes  Lives With: Spouse Prior Function Level of Independence: Requires assistive device for independence Vocation: Retired Comments: Has been doing [employer]. reports her balance is not great and she falls a lot. Vision/Perception  Perception Perception: Within Functional Limits Praxis Praxis: Intact  Cognition Overall Cognitive Status: Within Functional Limits for tasks assessed Orientation Level: Oriented X4 Safety/Judgment:  Appears intact Sensation Sensation Light Touch: Appears Intact Coordination Gross Motor Movements are Fluid and Coordinated: Yes Fine Motor Movements are Fluid and Coordinated: Yes Finger Nose Finger Test: WFL Bil Heel Shin Test: limited by body habitus Motor  Motor Motor: Other (comment) Motor - Skilled Clinical Observations: generalized weakness  Mobility Bed Mobility Bed Mobility: Rolling  Right;Rolling Left;Sit to Supine;Supine to Sit Rolling Right: Contact Guard/Touching assist Rolling Left: Contact Guard/Touching assist Supine to Sit: Minimal Assistance - Patient > 75% Sit to Supine: Minimal Assistance - Patient > 75% Transfers Transfers: Sit to Stand;Stand Pivot Transfers Sit to Stand: Minimal Assistance - Patient > 75% Stand Pivot Transfers: Minimal Assistance - Patient > 75% Transfer (Assistive device): Rolling walker Locomotion  Gait Ambulation: Yes Gait Assistance: Independent with assistive device;Minimal Assistance - Patient > 75% Gait Distance (Feet): 25 Feet Assistive device: Rolling walker Gait Assistance Details: decreased speed occassional L knee instability. no LOB Gait Gait: Yes Gait Pattern: Impaired Gait Pattern: Left flexed knee in stance Stairs / Additional Locomotion Stairs: No Wheelchair Mobility Wheelchair Mobility: Yes Wheelchair Assistance: Minimal assistance - Patient >75% Wheelchair Propulsion: Both upper extremities Wheelchair Parts Management: Needs assistance Distance: 75  Trunk/Postural Assessment  Cervical Assessment Cervical Assessment: Exceptions to [facility] Thoracic Assessment Thoracic Assessment: Exceptions to [facility] Lumbar Assessment Lumbar Assessment: Exceptions to [facility] Postural Control Postural Control: Within Functional Limits  Balance Balance Balance Assessed: Yes Static Sitting Balance Static Sitting - Balance Support: No upper extremity supported Static Sitting - Level of Assistance: 6: Modified independent (Device/Increase time) Dynamic Sitting Balance Dynamic Sitting - Balance Support: No upper extremity supported Dynamic Sitting - Level of Assistance: 5: Stand by assistance Static Standing Balance Static Standing - Level of Assistance: 5: Stand by assistance Dynamic Standing Balance Dynamic Standing - Balance Support: No upper extremity supported Dynamic Standing - Level of Assistance: 4: Min assist Extremity  Assessment      RLE Assessment RLE Assessment: Within Functional Limits General Strength Comments: grossly 4+/5 proximal to distal LLE Assessment LLE Assessment: Exceptions to [facility] Strength Comments: grossly 4/5 proximal to distal.    Refer to Care Plan for Long Term Goals  Recommendations for other services: None   Discharge Criteria: Patient will be discharged from PT if patient refuses treatment 3 consecutive times without medical reason, if treatment goals not met, if there is a change in medical status, if patient makes no progress towards goals or if patient is discharged from hospital.  The above assessment, treatment plan, treatment alternatives and goals were discussed and mutually agreed upon: by patient  Lorie Phenix 09/14/2018, 9:11 AM

## 2018-09-14 NOTE — Progress Notes (Signed)
Patient information reviewed and entered into eRehab System by Becky Raffi Milstein, PPS coordinator. Information including medical coding, function ability, and quality indicators will be reviewed and updated through discharge.   

## 2018-09-15 ENCOUNTER — Inpatient Hospital Stay (HOSPITAL_COMMUNITY): Payer: Medicare Other | Admitting: Occupational Therapy

## 2018-09-15 ENCOUNTER — Inpatient Hospital Stay (HOSPITAL_COMMUNITY): Payer: Medicare Other | Admitting: Physical Therapy

## 2018-09-15 ENCOUNTER — Inpatient Hospital Stay (HOSPITAL_COMMUNITY): Payer: Medicare Other

## 2018-09-15 DIAGNOSIS — I5031 Acute diastolic (congestive) heart failure: Secondary | ICD-10-CM

## 2018-09-15 LAB — BASIC METABOLIC PANEL
Anion gap: 16 — ABNORMAL HIGH (ref 5–15)
BUN: 29 mg/dL — ABNORMAL HIGH (ref 8–23)
CO2: 36 mmol/L — ABNORMAL HIGH (ref 22–32)
Calcium: 9.5 mg/dL (ref 8.9–10.3)
Chloride: 81 mmol/L — ABNORMAL LOW (ref 98–111)
Creatinine, Ser: 1.68 mg/dL — ABNORMAL HIGH (ref 0.44–1.00)
GFR calc Af Amer: 35 mL/min — ABNORMAL LOW (ref >60)
GFR calc non Af Amer: 30 mL/min — ABNORMAL LOW (ref >60)
Glucose, Bld: 184 mg/dL — ABNORMAL HIGH (ref 70–99)
Potassium: 3.1 mmol/L — ABNORMAL LOW (ref 3.5–5.1)
Sodium: 133 mmol/L — ABNORMAL LOW (ref 135–145)

## 2018-09-15 LAB — GLUCOSE, CAPILLARY
Glucose-Capillary: 175 mg/dL — ABNORMAL HIGH (ref 70–99)
Glucose-Capillary: 184 mg/dL — ABNORMAL HIGH (ref 70–99)
Glucose-Capillary: 279 mg/dL — ABNORMAL HIGH (ref 70–99)
Glucose-Capillary: 216 mg/dL — ABNORMAL HIGH (ref 70–99)
Glucose-Capillary: 199 mg/dL — ABNORMAL HIGH (ref 70–99)
Glucose-Capillary: 267 mg/dL — ABNORMAL HIGH (ref 70–99)

## 2018-09-15 MED ORDER — SODIUM CHLORIDE 0.9 % IV SOLN
INTRAVENOUS | Status: DC
  Administered 2018-09-15: 19:00:00 via INTRAVENOUS

## 2018-09-15 MED ORDER — POTASSIUM CHLORIDE CRYS ER 20 MEQ PO TBCR
40.0000 meq | EXTENDED_RELEASE_TABLET | Freq: Three times a day (TID) | ORAL | Status: DC
  Administered 2018-09-15 – 2018-09-17 (×6): 40 meq via ORAL
  Filled 2018-09-15 (×6): qty 2

## 2018-09-15 NOTE — Progress Notes (Signed)
   09/15/18 0136  What Happened  Was fall witnessed? Yes  Who witnessed fall? staff  Patients activity before fall ambulating-assisted;bathroom-assisted  Point of contact buttocks  Was patient injured? No  Follow Up  MD notified Danella Sensing, NP  Time MD notified [phone]  Adult Fall Risk Assessment  Risk Factor Category (scoring not indicated) History of more than one fall within 6 months before admission (document High fall risk);High fall risk per protocol (document High fall risk)  Age 73  Fall History: Fall within 6 months prior to admission 5  Elimination; Bowel and/or Urine Incontinence 2  Elimination; Bowel and/or Urine Urgency/Frequency 2  Medications: includes PCA/Opiates, Anti-convulsants, Anti-hypertensives, Diuretics, Hypnotics, Laxatives, Sedatives, and Psychotropics 5  Patient Care Equipment 0  Mobility-Assistance 2  Mobility-Gait 2  Mobility-Sensory Deficit 0  Altered awareness of immediate physical environment 0  Impulsiveness 0  Lack of understanding of one's physical/cognitive limitations 0  Total Score 20  Patient Fall Risk Level High fall risk  Adult Fall Risk Interventions  Required Bundle Interventions *See Row Information* High fall risk - low, moderate, and high requirements implemented  Additional Interventions Use of appropriate toileting equipment (bedpan, BSC, etc.);Lap belt while in chair/wheelchair;PT/OT need assessed if change in mobility from baseline  Screening for Fall Injury Risk (To be completed on HIGH fall risk patients) - Assessing Need for Low Bed  Risk For Fall Injury- Low Bed Criteria Admitted as a result of a fall  Will Implement Low Bed and Floor Mats Low bed contraindicated, floor mats in place  Specialty Low Bed Contraindicated Hemodynamically unstable  Screening for Fall Injury Risk (To be completed on HIGH fall risk patients who do not meet crieteria for Low Bed) - Assessing Need for Floor Mats Only  Risk For Fall Injury- Criteria for  Floor Mats Bleeding risk-anticoagulation (not prophylaxis)  Will Implement Floor Mats Yes  Vitals  Temp 97.9 F (36.6 C)  Temp Source Oral  BP 132/60  MAP (mmHg) 82  BP Location Left Arm  BP Method Automatic  Patient Position (if appropriate) Sitting  Pulse Rate 66  Resp 18  Oxygen Therapy  SpO2 96 %  O2 Device Room Air  Pain Assessment  Pain Scale 0-10  Pain Score 0  Neurological  Neuro (WDL) X  Level of Consciousness Alert  Orientation Level Oriented X4  Cognition Appropriate at baseline  Speech Clear  Pupil Assessment  No  Motor Function/Sensation Assessment Grip;Motor response  R Hand Grip Weak  L Hand Grip Weak   R Foot Dorsiflexion Weak  L Foot Dorsiflexion Weak  R Foot Plantar Flexion Weak  L Foot Plantar Flexion Weak  RUE Motor Response Purposeful movement  RUE Sensation Full sensation  RUE Motor Strength 4  LUE Motor Response Purposeful movement  LUE Sensation Full sensation  LUE Motor Strength 4  RLE Motor Response Purposeful movement  RLE Sensation Full sensation  RLE Motor Strength 4  LLE Motor Response Purposeful movement  LLE Sensation Full sensation  LLE Motor Strength 4  Neuro Symptoms None  Neuro Additional Assessments No  Neuro Additional Assessments No  Musculoskeletal  Musculoskeletal (WDL) X  Assistive Device Wheelchair;Front wheel walker  Generalized Weakness Yes  Weight Bearing Restrictions No  Integumentary  Integumentary (WDL) X  Skin Integrity Ecchymosis  Ecchymosis Location Abdomen;Arm;Back;Leg;Shoulder  Ecchymosis Location Orientation Left;Bilateral  Ecchymosis Intervention Other (Comment) (Assessed)

## 2018-09-15 NOTE — Progress Notes (Signed)
Physical Therapy Session Note  Patient Details  Name: [name] [name] [name]: [id number] Date of Birth: Jun 21, 1945  Today's Date: 09/15/2018 PT Individual Time: 1255-1330 AND 1530-1555 PT Individual Time Calculation (min): 35 min AND 25 min  Short Term Goals: Week 1:  PT Short Term Goal 1 (Week 1): Pt will ambulate 68ft with LRAD and CGA PT Short Term Goal 2 (Week 1): Pt will perform bed mobility with CGA. PT Short Term Goal 3 (Week 1): Pt will maintain standing balance with CGA x 3 min with functional activity PT Short Term Goal 4 (Week 1): Pt will propell WC 129ft with supervision assist  Skilled Therapeutic Interventions/Progress Updates:   Session 1:  Pt in w/c, eating lunch upon arrival. Finished eating while discussing fall with nursing last night. Pt states she was transferring chair<>toilet w/ nursing staff and LEs buckled. Stated she "had nothing left in her LEs". She also had near fall w/ other therapist this morning, states the same thing happened with her LEs. Requested to toilet. Transferred from w/c to toilet w/ min assist using grab bars and armrests. After turning to sit, pt stated "I'm doing down" and her trunk leaned over. She confirmed she felt dizzy. Therapist assisted back to an upright position on toilet, called for RN assistance. Verbal cues for breathing pattern. Once given a few minutes to rest seated on toilet, sit>stand in stedy w/ min assist and stedy transfer to EOB for safety. Sit>supine w/ mod assist. BP in supine 131/57, 109/64 at EOB and pt symptomactic. Sat EOB for a few minutes w/ close supervision and retook vitals, BP remained 107/64. HR and O2 sat all WNL. Returned to supine and ended session in supine, all needs in reach. Pt confirmed she felt better laying down and felt as if she needed to rest.   Session 2:  Pt in supine and agreeable to reattempt therapy session. PA requesting to wrap ACEs around LEs for BP management in addition to TEDs. OT had just put on  thigh-high TEDs. This therapist wrapped LEs with ACE wraps. Pt requesting to void as she was unable to get to the toilet in earlier session. Supine>sit w/ min assist and stand pivot to [facility] w/ min assist and RW, verbal cues for breathing strategy. Pt c/o mild dizziness w/ upright mobility. Continent of void. Total assist for pericare and brief management for time. Stand pivot back to EOB. Pt really wanting to stay OOB. Attempted to take BP while sitting EOB, however pt began shaking and felt increasingly dizzy. Returned to supine, min assist and donned supplemental O2 @ 1 L/min. Within a few min, pt reported feeling better, agreeable to lie down in attempts to minimize dizziness and orthostasis. RN aware. Ended session in supine, all needs in reach.  Therapy Documentation Precautions:  Precautions Precautions: Fall Precaution Comments: hx of falls at home (~8 in last 6 months) Restrictions Weight Bearing Restrictions: No Vital Signs: Therapy Vitals Pulse Rate: 63 Resp: 16 BP: 107/64 Patient Position (if appropriate): Sitting Oxygen Therapy SpO2: 100 % O2 Device: Nasal Cannula O2 Flow Rate (L/min): 1 L/min  Therapy/Group: Individual Therapy  Ronrico Dupin Clent Demark 09/15/2018, 1:48 PM

## 2018-09-15 NOTE — Significant Event (Signed)
Called to room by NT who was using stedy to transfer patient to bathroom.  Patient was noted to be going in and out of consciousness and has a history of severe orthostatic hypotension.  Patient currently wearing thigh high teds and compression wraps.  Patient assisted back to bed.  VSS.  BS checked- WNL.  Dr. Letta Pate notified, new orders received for IVF.  Patient reported associated dizziness which resolved soon after placing patient in supine position in bed.  Will pass on to oncoming shift for further monitoring.   Brita Romp, RN

## 2018-09-15 NOTE — Progress Notes (Signed)
Occupational Therapy Session Note  Patient Details  Name: [name] [name] [name]: [id number] Date of Birth: Feb 05, 1945  Today's Date: 09/15/2018  Session 1 OT Individual Time: 3546-5681 OT Individual Time Calculation (min): 40 min   Session 2 OT Individual Time: 2751-7001 OT Individual Time Calculation (min): 43 min   Short Term Goals: Week 1:  OT Short Term Goal 1 (Week 1): STGs equal to LTGs set at supervision level overall.  Skilled Therapeutic Interventions/Progress Updates:  Session 1   Pt was greeted sitting on [facility] with nurse tech. Pt had voided bladder and needed assistance for peri-care. Stand-pivot back to wc with min A. Per report from NT, pt had an assisted fall in the bathroom last night. Pt reported she felt like her LLE just gave out and she "melted down the wall." Will continue working on LB strengthening/balance/endurance to decrease risk of falls. Pt with frequent falls at home PTA so she is a high fall risk. Pt completed bathing/dressing at the sink. Pt completed UB bathing/dressing from wc at the sink with assistance to fasten bra in the back. Worked on LB dressing and sit<>stands with pt tolerating standing for 1 minute while OT washed buttocks and pt able to wash peri-area. Pt able to thread pants with min A. Sit<>stand with min A while OT assisted with pulling pants over hips. OT donned L ankle brace for pt. This is a complicated brace and will be difficult for pt to do at home. Will ask team about ordering a different brace. Max A to don shoes and socks. Pt set-up for breakfast, then she started dry heaving- did not have bout of emesis though. Pt stated she took her medications on and empty stomach. OT provided pt with emesis bag and left pt eating breakfast with chair alarm on and call bell in reach.  Session 2 Pt greeted asleep in bed, easy to wake and agreeable to OT treatment session. Per discussion with PT, pt had episode of dizziness today when getting to the bathroom  and almost passed out. OT spent session taking orthostatic vitals as described in detail belt. Pt with a 33 point drop in systolic pressure. Pt also very shaky in standing when trying to get standing BP. OT returned pt to supine and discussed with PA. OT donned thigh high TEDs on patient to try during next therapy session. We can also try wrapping LEs in TED hose if TEDs don't work. Pt left semi-reclined in bed handoff to PT for makeup session.    09/15/18 1543  Therapy Vitals  Patient Position (if appropriate) Orthostatic Vitals  Orthostatic Lying   BP- Lying 142/63  Orthostatic Sitting  BP- Sitting 116/58  Orthostatic Standing at 0 minutes  BP- Standing at 0 minutes 109/89  Oxygen Therapy  SpO2 97 %  O2 Device Room Air    Therapy Documentation Precautions:  Precautions Precautions: Fall Precaution Comments: hx of falls at home (~8 in last 6 months) Restrictions Weight Bearing Restrictions: No Pain:  denies pain   Therapy/Group: Individual Therapy  Valma Cava 09/15/2018, 3:45 PM

## 2018-09-15 NOTE — Progress Notes (Signed)
[facility] PHYSICAL MEDICINE & REHABILITATION PROGRESS NOTE   Subjective/Complaints: Pt in good spirits. Had a fall in bathroom last night when legs gave out d/t being "tired" from day of therapy. About to get up again with therapy when I walked in  ROS: Patient denies fever, rash, sore throat, blurred vision, nausea, vomiting, diarrhea, cough, shortness of breath or chest pain,  headache, or mood change.    Objective:   No results found. Recent Labs    09/13/18 0317 09/14/18 0538  WBC 10.9* 11.3*  HGB 10.9* 12.3  HCT 33.9* 38.6  PLT 191 196   Recent Labs    09/14/18 1242 09/15/18 0624  NA 131* 133*  K 3.3* 3.1*  CL 74* 81*  CO2 39* 36*  GLUCOSE 281* 184*  BUN 28* 29*  CREATININE 1.72* 1.68*  CALCIUM 9.9 9.5    Intake/Output Summary (Last 24 hours) at 09/15/2018 1045 Last data filed at 09/15/2018 1017 Gross per 24 hour  Intake 360 ml  Output 59 ml  Net 301 ml     Physical Exam: Vital Signs Blood pressure 124/61, pulse (!) 58, temperature 97.6 F (36.4 C), temperature source Oral, resp. rate 16, SpO2 100 %. Constitutional: No distress . Vital signs reviewed. HEENT: EOMI, oral membranes moist Neck: supple Cardiovascular: RRR without murmur. No JVD    Respiratory: CTA Bilaterally without wheezes or rales. Normal effort, O2NC GI: BS +, non-tender, non-distended, a few scattered abdominal bruises Musculoskeletal: tr to 1+ bilateral LE Neuro: mild left facial droop. Speech generally intelligible. Sl dysarthric. UE 4/5 prox to distal. LE 3/5 HF, KE and 4-/5 ADF/PF. No focal sensory changes.    Skin: Skin is warm. Rash (has psoriatic type rash above elbow on LUE) --stable  Left thigh, right back bruising Psychiatric: Pleasant and appropriate   Assessment/Plan: 1. Functional deficits secondary to debility after pneumonia, hx of right frontal CVA which require 3+ hours per day of interdisciplinary therapy in a comprehensive inpatient rehab setting.  Physiatrist is  providing close team supervision and 24 hour management of active medical problems listed below.  Physiatrist and rehab team continue to assess barriers to discharge/monitor patient progress toward functional and medical goals  Care Tool:  Bathing    Body parts bathed by patient: Right arm, Left arm, Chest, Abdomen, Front perineal area, Buttocks, Right upper leg, Left upper leg, Right lower leg, Face, Left lower leg         Bathing assist Assist Level: Minimal Assistance - Patient > 75%     Upper Body Dressing/Undressing Upper body dressing   What is the patient wearing?: Pull over shirt, Bra    Upper body assist Assist Level: Minimal Assistance - Patient > 75%    Lower Body Dressing/Undressing Lower body dressing      What is the patient wearing?: Underwear/pull up, Pants, Incontinence brief     Lower body assist Assist for lower body dressing: Minimal Assistance - Patient > 75%     Toileting Toileting    Toileting assist Assist for toileting: 2 Helpers     Transfers Chair/bed transfer  Transfers assist     Chair/bed transfer assist level: 2 Helpers     Locomotion Ambulation   Ambulation assist      Assist level: Minimal Assistance - Patient > 75% Assistive device: Walker-rolling Max distance: 10'   Walk 10 feet activity   Assist     Assist level: Minimal Assistance - Patient > 75% Assistive device: Walker-rolling   Walk 50  feet activity   Assist Walk 50 feet with 2 turns activity did not occur: Safety/medical concerns         Walk 150 feet activity   Assist Walk 150 feet activity did not occur: Safety/medical concerns         Walk 10 feet on uneven surface  activity   Assist Walk 10 feet on uneven surfaces activity did not occur: Safety/medical concerns         Wheelchair     Assist   Type of Wheelchair: Manual    Wheelchair assist level: Minimal Assistance - Patient > 75% Max wheelchair distance: 75     Wheelchair 50 feet with 2 turns activity    Assist        Assist Level: Minimal Assistance - Patient > 75%   Wheelchair 150 feet activity     Assist  Wheelchair 150 feet activity did not occur: Safety/medical concerns       Blood pressure 124/61, pulse (!) 58, temperature 97.6 F (36.4 C), temperature source Oral, resp. rate 16, SpO2 100 %.  Medical Problem List and Plan:  1. Decreased functional mobility secondary to subacute to chronic right frontal lobe infarction/acute respiratory failure with sepsis/lobar pneumonia   -Continue CIR therapies today including PT and OT  2. Antithrombotics:  -DVT/anticoagulation: Continue Eliquis  -antiplatelet therapy: Continue Plavix 75 mg daily  3. Pain Management: Neurontin 600 mg twice daily, continue oxycodone 5 mg every 6 hours as needed moderate to severe pain/chronic back pain   -seems to be controlled at present 4. Mood: Cymbalta 30 mg daily, Xanax 0.5 mg twice daily, Wellbutrin 150 mg daily. Provide emotional support  -antipsychotic agents: N/A  5. Neuropsych: This patient is capable of making decisions on her own behalf.  6. Skin/Wound Care: Routine skin checks  7. Fluids/Electrolytes/Nutrition: Routine in and outs with follow-up chemistries  8. Diabetes mellitus. Hemoglobin A1c 7.0. NovoLog 4 units 3 times daily, Lantus insulin 28 units nightly.   -improving control but still elevated  - monitor for pattern after adjusting lantus to 32 u HS on 8/13 9. ID /MRSA PCR screening positive. levaquin finishes today 8/14  -wbc's 11k 10. CKD stage III. Admission creatinine 1.13 with baseline 1.39-1.95. ---1.72 this afternoon. 11. Asthma/OSA. Patient not on CPAP- refuses. Continue Dulera as well as nebulizer.   -pt used oxygen PTA  -on 2l currently  12. Hypertension. Toprol-XL 50 mg daily, Ranexa 1000 mg every 12 hours Norvasc 10 mg daily   -controlled 13. Hypokalemia. K+ still at 3.1 today  -increase kdur 65meq tid over  weekend 14. CAD with CABG. Continue Plavix. No increasing chest pain  15. Diastolic congestive heart failure. Monitor for any signs of fluid overload.. Continue Zaroxolyn 2.5 mg daily as well as fluid restriction-    Still no daily weights! 16. Hyperlipidemia. Zetia/Crestor  17. Urine retention: pvr's low  -continue up to bsc or toilet  18. Psoriasis? Vs eczema- can con't current steroid cream or try Betamethasone cream BID on LUE to help with rash.        LOS: 2 days A FACE TO [place] 09/15/2018, 10:45 AM

## 2018-09-15 NOTE — IPOC Note (Signed)
Overall Plan of Care [facility]) Patient Details Name: [name] [name] [name]: [id number] DOB: 30-Sep-1945  Admitting Diagnosis: Ischemic cerebrovascular accident (CVA) of frontal lobe [facility])  Hospital Problems: Principal Problem:   Ischemic cerebrovascular accident (CVA) of frontal lobe ([place])     Functional Problem List: Nursing Edema, Endurance, Medication Management, Safety, Pain  PT Balance, Edema, Endurance, Behavior, Nutrition  OT Balance, Endurance  SLP    TR         Basic ADL's: OT Toileting, Dressing, Bathing, Grooming     Advanced  ADL's: OT Simple Meal Preparation     Transfers: PT Bed Mobility, Car, Bed to Chair, [employer], Floor  OT Tub/Shower, Agricultural engineer: PT Ambulation, Stairs, Wheelchair Mobility     Additional Impairments: OT None  SLP        TR      Anticipated Outcomes Item Anticipated Outcome  Self Feeding independent  Swallowing      Basic self-care  supervision  Toileting  supervision   Bathroom Transfers supervision  Bowel/Bladder  continent of bladder and bowel  Transfers  Supervision assist with LRAD  Locomotion  SUpervision assist at household level and RW  Communication     Cognition     Pain  pain at or below level 4  Safety/Judgment  maintain safety with cues/reminders   Therapy Plan: PT Intensity: Minimum of 1-2 x/day ,45 to 90 minutes PT Frequency: 5 out of 7 days PT Duration Estimated Length of Stay: 10-12 days OT Intensity: Minimum of 1-2 x/day, 45 to 90 minutes OT Frequency: 5 out of 7 days OT Duration/Estimated Length of Stay: 7-9 days     Due to the current state of emergency, patients may not be receiving their 3-hours of Medicare-mandated therapy.   Team Interventions: Nursing Interventions Patient/Family Education, Pain Management, Medication Management, Discharge Planning, Disease Management/Prevention, Psychosocial Support  PT interventions Ambulation/gait training, Discharge planning, Functional  mobility training, Psychosocial support, Therapeutic Activities, Visual/perceptual remediation/compensation, Wheelchair propulsion/positioning, Therapeutic Exercise, Skin care/wound management, Disease management/prevention, Neuromuscular re-education, Training and development officer, Cognitive remediation/compensation, DME/adaptive equipment instruction, Splinting/orthotics, Pain management, UE/LE Strength taining/ROM, UE/LE Coordination activities, Stair training, Patient/family education, Community reintegration  OT Interventions Training and development officer, Discharge planning, Therapeutic Activities, Self Care/advanced ADL retraining, UE/LE Coordination activities, Functional mobility training, Patient/family education, Therapeutic Exercise, Community reintegration, Engineer, drilling, Neuromuscular re-education, UE/LE Strength taining/ROM  SLP Interventions    TR Interventions    SW/CM Interventions Discharge Planning, Psychosocial Support, Patient/Family Education   Barriers to Discharge MD  Medical stability  Nursing      PT Inaccessible home environment, Decreased caregiver support, Home environment Child psychotherapist, Medical stability    OT      SLP      SW       Team Discharge Planning: Destination: PT-Home ,OT- Home , SLP-  Projected Follow-up: PT-Home health PT, OT-  Home health OT, 24 hour supervision/assistance, SLP-  Projected Equipment Needs: PT-To be determined, OT- 3 in 1 bedside comode, SLP-  Equipment Details: PT- , OT-  Patient/family involved in discharge planning: PT- Patient,  OT-Patient, SLP-   MD ELOS: 10-11 days Medical Rehab Prognosis:  Excellent Assessment: The patient has been admitted for CIR therapies with the diagnosis of debility/CVA. The team will be addressing functional mobility, strength, stamina, balance, safety, adaptive techniques and equipment, self-care, bowel and bladder mgt, patient and caregiver education, NMR, community reentry. Goals  have been set at supervision to occasional mod I.   Due to the current state of emergency,  patients may not be receiving their 3 hours per day of Medicare-mandated therapy.    [name], MD, FAAPMR      See Team Conference Notes for weekly updates to the plan of care

## 2018-09-15 NOTE — Discharge Instructions (Signed)
Inpatient Rehab Discharge Instructions  [name] [name] Discharge date and time: No discharge date for patient encounter.   Activities/Precautions/ Functional Status: Activity: activity as tolerated Diet: diabetic diet Wound Care: none needed Functional status:  ___ No restrictions     ___ Walk up steps independently ___ 24/7 supervision/assistance   ___ Walk up steps with assistance ___ Intermittent supervision/assistance  ___ Bathe/dress independently ___ Walk with walker     _x__ Bathe/dress with assistance ___ Walk Independently    ___ Shower independently ___ Walk with assistance    ___ Shower with assistance ___ No alcohol     ___ Return to work/school ________  Special Instructions: No driving smoking or alcohol   My questions have been answered and I understand these instructions. I will adhere to these goals and the provided educational materials after my discharge from the hospital.  Patient/Caregiver Signature _______________________________ Date __________  Clinician Signature _______________________________________ Date __________  Please bring this form and your medication list with you to all your follow-up doctor's appointments.  Information on my medicine - ELIQUIS (apixaban)  This medication education was reviewed with me or my healthcare representative as part of my discharge preparation.    Why was Eliquis prescribed for you? Eliquis was prescribed for you to reduce the risk of a blood clot forming that can cause a stroke if you have a medical condition called atrial fibrillation (a type of irregular heartbeat).  What do You need to know about Eliquis ? Take your Eliquis TWICE DAILY - one tablet in the morning and one tablet in the evening with or without food. If you have difficulty swallowing the tablet whole please discuss with your pharmacist how to take the medication safely.  Take Eliquis exactly as prescribed by your doctor and DO NOT stop taking  Eliquis without talking to the doctor who prescribed the medication.  Stopping may increase your risk of developing a stroke.  Refill your prescription before you run out.  After discharge, you should have regular check-up appointments with your healthcare provider that is prescribing your Eliquis.  In the future your dose may need to be changed if your kidney function or weight changes by a significant amount or as you get older.  What do you do if you miss a dose? If you miss a dose, take it as soon as you remember on the same day and resume taking twice daily.  Do not take more than one dose of ELIQUIS at the same time to make up a missed dose.  Important Safety Information A possible side effect of Eliquis is bleeding. You should call your healthcare provider right away if you experience any of the following: ? Bleeding from an injury or your nose that does not stop. ? Unusual colored urine (red or dark brown) or unusual colored stools (red or black). ? Unusual bruising for unknown reasons. ? A serious fall or if you hit your head (even if there is no bleeding).  Some medicines may interact with Eliquis and might increase your risk of bleeding or clotting while on Eliquis. To help avoid this, consult your healthcare provider or pharmacist prior to using any new prescription or non-prescription medications, including herbals, vitamins, non-steroidal anti-inflammatory drugs (NSAIDs) and supplements.  This website has more information on Eliquis (apixaban): http://www.eliquis.com/eliquis/home

## 2018-09-15 NOTE — Progress Notes (Signed)
Physical Therapy Session Note  Patient Details  Name: [name] [name] [name]: [id number] Date of Birth: 1945-11-27  Today's Date: 09/15/2018 PT Individual Time: 1035-1120 PT Individual Time Calculation (min): 45 min   Short Term Goals: Week 1:  PT Short Term Goal 1 (Week 1): Pt will ambulate 51ft with LRAD and CGA PT Short Term Goal 2 (Week 1): Pt will perform bed mobility with CGA. PT Short Term Goal 3 (Week 1): Pt will maintain standing balance with CGA x 3 min with functional activity PT Short Term Goal 4 (Week 1): Pt will propell WC 117ft with supervision assist Week 2:    Week 3:     Skilled Therapeutic Interventions/Progress Updates:      Therapy Documentation Precautions:  Precautions Precautions: Fall Precaution Comments: hx of falls at home (~8 in last 6 months) Restrictions Weight Bearing Restrictions: No   PAIN:  Pt stated that she had minor soreness in her r toes that she attributed to controlled fall in bathroom last evening.  Denied pain during session.  AM Session:  Therapeutic Exercise:  wc propulsion 72ft including one turn using bilat UE's for cardiovascualr warmup.  02sats 98%, HR 84  Propels slowly and requires verbal cues for efficient strokes (vs short inefficient)  Sit to stand w/Rw w/min assist and verbal cues for ant wt shift and for hand placement.  Repeated 2 reps x 3 sets w/rest breaks between efforts.  Sit to stand w/RW and gait x 46ft w/cga, LLE became increasingly unstable and L knee buckled requiring max assist of 3 to transfer to chair and to prevent fall.    wc propulsion 78ft forward, 30 feet backwards x 3 sets for LE strengthening     Therapeutic Activity: 02 sats monitored throughout rx and remained above 97% at all times, HR 68-88.   Assessment:  Pt w very poor L knee control today w/episode of buckling requiring max assist of 3 to transfer to wc/prevent fall.  Reported having fallen last evening w/same issue of knee instability occurring.   Nursing notified and discussed using Steady for transfers due to change in functional status.   Able to continue w/treatment and paticipate in standing exercise without further issues.   Callie Fielding, PT   Therapy/Group: Individual Therapy  Jerrilyn Cairo 09/15/2018, 12:28 PM

## 2018-09-15 NOTE — Progress Notes (Signed)
Social Work Assessment and Plan   Patient Details  Name: [name] [name] [name]: [id number] Date of Birth: November 11, 1945  Today's Date: 09/15/2018  Problem List:  Patient Active Problem List   Diagnosis Date Noted  . Ischemic cerebrovascular accident (CVA) of frontal lobe ([place]) 09/13/2018  . Fall 09/09/2018  . Stroke ([place]) 09/09/2018  . Type II diabetes mellitus with renal manifestations ([place]) 09/09/2018  . CKD (chronic kidney disease), stage III ([place]) 09/09/2018  . Sepsis ([place]) 09/09/2018  . Depression with anxiety 09/09/2018  . Slurred speech 09/09/2018  . Ankle fracture, left 05/16/2018  . Amiodarone pulmonary toxicity   . Physical deconditioning   . 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 Acute on chronic congestive heart failure ([place])   . Chronic systolic CHF (congestive heart failure) ([place]) 01/16/2018  . Acute on chronic diastolic CHF (congestive heart failure) ([place]) 01/16/2018  . Acute on chronic diastolic (congestive) heart failure ([place]) 01/16/2018  . Atrial fibrillation ([place]) 10/21/2017  . CAD (coronary artery disease) of bypass graft 10/20/2017  . Coronary artery disease of bypass graft of native heart with stable angina pectoris ([place])   . Chronic low back pain 08/24/2016  . Pain of left thumb 08/05/2016  . Nocturnal hypoxia 06/02/2016  . Hoarseness 04/05/2016  . Laryngopharyngeal reflux (LPR) 04/05/2016  . Pharyngoesophageal dysphagia 04/05/2016  . Angina decubitus ([place]) 05/22/2015  . Chronic insomnia 05/06/2015  . Common migraine 05/14/2014  . Abnormality of gait 05/14/2014  . Memory change 05/14/2014  . Aneurysm, cerebral, nonruptured 05/14/2014  . Cramp of limb-Left neck 05/10/2014  . Hematemesis with nausea   . Vomiting blood   . Dizziness and giddiness 09/21/2013  . Atypical chest pain 07/18/2013  . Unstable angina ([place]) 04/09/2013  . Carotid artery stenosis, symptomatic 03/20/2013  . Chronic diastolic CHF (congestive heart failure) ([place]) 09/23/2012  . Cerebrovascular  disease 07/10/2012  . Cerebral artery occlusion with cerebral infarction ([place]) 07/10/2012  . Mitral regurgitation 04/15/2012  . TIA (transient ischemic attack) 04/15/2012  . Occlusion and stenosis of carotid artery without mention of cerebral infarction 08/18/2011  . Bariatric surgery status 06/17/2011  . Speech abnormality 03/22/2011  . Dyspnea 02/24/2011  . PAPILLARY MUSCLE DYSFUNCTION, NON-RHEUMATIC 10/09/2008  . UNSPECIFIED VITAMIN D DEFICIENCY 10/24/2007  . MYOCARDIAL INFARCTION, HX OF 10/24/2007  . PERSISTENT VOMITING 10/24/2007  . OSTEOARTHRITIS 10/24/2007  . MIGRAINES, HX OF 10/24/2007  . DM 01/16/2007  . Hyperlipemia 01/16/2007  . Obesity-post failed open gastroplasty 1984  01/16/2007  . OBSTRUCTIVE SLEEP APNEA 01/16/2007  . Essential hypertension 01/16/2007  . Coronary atherosclerosis 01/16/2007  . Asthma 01/16/2007  . GERD 01/16/2007  . VENTRAL HERNIA 01/16/2007   Past Medical History:  Past Medical History:  Diagnosis Date  . Abnormality of gait 05/14/2014  . Anemia    hx  . Anginal pain ([place])   . Anxiety   . Asthma   . Basal cell carcinoma 05/2014   "left shoulder"  . Bundle branch block, left    chronic/notes 07/18/2013  . CHF (congestive heart failure) ([place])   . Chronic bronchitis ([place])    "off and on all the time" (07/18/2013)  . Chronic insomnia 05/06/2015  . Chronic kidney disease    frequency, sees dr Jamal Maes every 4 to 6 months (01/16/2018)  . Chronic low back pain 08/24/2016  . Chronic lower back pain   . Claustrophobia   . Common migraine 05/14/2014  . Coronary artery disease   . Depression   . Dysrhythmia   . GERD (  gastroesophageal reflux disease)   . H/O hiatal hernia   . Headache    "at least 2/month" (01/16/2018)  . Heart murmur   . Hyperlipidemia   . Hypertension   . Irregular heart beat   . Leg cramps    both legs at times  . Melanoma ([place]) 05/2014   "burned off BLE" (01/16/2018)  . Memory change 05/14/2014  . Migraine     "5-6/year"  (01/16/2018)  . Myocardial infarction ([place]) 04/1999, 02/2000, 01/2005; 2011; 2014  . Neuromuscular disorder ([place])    ?  . Obesity 01-2010  . Obstructive sleep apnea    "can't wear machine; I have claustrophobia" (01/16/2018), states she had a 2nd sleep study and does not have sleep apnea, her O2 decreases and now is on 2 L of O2 at night.  . On home oxygen therapy    "2L at night and prn during daytime" (01/16/2018)  . Osteoarthritis    "knees and hands" (01/16/2018)  . Other and unspecified angina pectoris   . Peripheral vascular disease (HCC)    ? numbness, tingling arms and legs  . Pneumonia 2000's   "once"  . PONV (postoperative nausea and vomiting)   . Shortness of breath    with exertion  . Stroke ([place]) 03/22/12   right side brain; denies residual on 07/18/2013)  . Stroke [facility]) Oct. 2015   Affected pt.s balance  . Stroke [facility]) 10/2014   "affected my legs; fully recovered now"; still have sporatic memory issues from this one" (01/16/2018)  . Type II diabetes mellitus ([place])   . Ventral hernia    hx of  . Vomiting    persistent  . Vomiting blood    Past Surgical History:  Past Surgical History:  Procedure Laterality Date  . ANKLE FRACTURE SURGERY Left 1970's  . APPENDECTOMY  1970's   w/hysterectomy  . BASAL CELL CARCINOMA EXCISION Left 05/2014   "shoulder" (01/16/2018)  . CARDIAC CATHETERIZATION  10/10/2012   Dr [name].  Marland Kitchen CARDIAC CATHETERIZATION N/A 05/29/2015   Procedure: Right/Left Heart Cath and Coronary/Graft Angiography;  Surgeon: Larey Dresser, MD;  Location: [facility] CV LAB;  Service: Cardiovascular;  Laterality: N/A;  . CAROTID ENDARTERECTOMY Left 03/2013  . CAROTID STENT INSERTION Left 03/20/2013   Procedure: CAROTID STENT INSERTION;  Surgeon: [name], MD;  Location: [facility] CATH LAB;  Service: Cardiovascular;  Laterality: Left;  internal carotid  . CEREBRAL ANGIOGRAM N/A 04/05/2011   Procedure: CEREBRAL ANGIOGRAM;  Surgeon: Angelia Mould,  MD;  Location: [facility] CATH LAB;  Service: Cardiovascular;  Laterality: N/A;  . CHOLECYSTECTOMY OPEN  2004  . CORONARY ANGIOPLASTY WITH STENT PLACEMENT  01,02,05,06,07,08,11; 04/24/2013   "I've probably got ~ 10 stents by now" (04/24/2013)  . CORONARY ANGIOPLASTY WITH STENT PLACEMENT  06/13/2013   "got 4 stents today" (06/13/2013)  . CORONARY ARTERY BYPASS GRAFT  1220/11   "CABG X5"  . CORONARY STENT INTERVENTION N/A 10/20/2017   Procedure: CORONARY STENT INTERVENTION;  Surgeon: Troy Sine, MD;  Location: [facility] CV LAB;  Service: Cardiovascular;  Laterality: N/A;  . ESOPHAGOGASTRODUODENOSCOPY  08/03/2011   Procedure: ESOPHAGOGASTRODUODENOSCOPY (EGD);  Surgeon: Shann Medal, MD;  Location: [facility];  Service: General;  Laterality: N/A;  . ESOPHAGOGASTRODUODENOSCOPY (EGD) WITH PROPOFOL N/A 03/11/2014   Procedure: ESOPHAGOGASTRODUODENOSCOPY (EGD) WITH PROPOFOL;  Surgeon: Lafayette Dragon, MD;  Location: [facility] ENDOSCOPY;  Service: Endoscopy;  Laterality: N/A;  . FRACTURE SURGERY    . gall stone removal  05/2003  .  GASTRIC RESTRICTION SURGERY  1984   "stapeling"  . HERNIA REPAIR  2004   "in my stomach; had OR on it twice", wire mesh on 1 hernia  . LEFT HEART CATH AND CORS/GRAFTS ANGIOGRAPHY N/A 07/09/2016   Procedure: LEFT HEART CATH AND CORS/GRAFTS ANGIOGRAPHY;  Surgeon: Larey Dresser, MD;  Location: [facility] CV LAB;  Service: Cardiovascular;  Laterality: N/A;  . ORIF ANKLE FRACTURE Left 05/16/2018  . ORIF ANKLE FRACTURE Left 05/16/2018   Procedure: OPEN REDUCTION INTERNAL FIXATION (ORIF) Left ankle with possible syndesmosis fixation;  Surgeon: Nicholes Stairs, MD;  Location: [facility];  Service: Orthopedics;  Laterality: Left;  187min  . OVARY SURGERY  1970's   "tumor removed"  . PERCUTANEOUS CORONARY STENT INTERVENTION (PCI-S) N/A 06/13/2013   Procedure: PERCUTANEOUS CORONARY STENT INTERVENTION (PCI-S);  Surgeon: [name], MD;  Location: [facility] CATH LAB;  Service: Cardiovascular;   Laterality: N/A;  . PERCUTANEOUS STENT INTERVENTION N/A 04/24/2013   Procedure: PERCUTANEOUS STENT INTERVENTION;  Surgeon: [name], MD;  Location: [facility] CATH LAB;  Service: Cardiovascular;  Laterality: N/A;  . RIGHT/LEFT HEART CATH AND CORONARY/GRAFT ANGIOGRAPHY N/A 10/20/2017   Procedure: RIGHT/LEFT HEART CATH AND CORONARY/GRAFT ANGIOGRAPHY;  Surgeon: Larey Dresser, MD;  Location: [facility] CV LAB;  Service: Cardiovascular;  Laterality: N/A;  . ROOT CANAL  10/2000  . TIBIA FRACTURE SURGERY Right 1970's   rods and pins  . TOOTH EXTRACTION     "1 on the upper; wisdom tooth on the lower" (01/16/2018)  . TOTAL ABDOMINAL HYSTERECTOMY  1970's   w/ appendectomy   Social History:  reports that she has never smoked. She has never used smokeless tobacco. She reports that she does not drink alcohol or use drugs.  Family / Support Systems Marital Status: Married Patient Roles: Spouse, Parent Spouse/Significant Other: Ricka Westra @ (H) [phone] or (C) [phone] Children: daughter, Dalphine Handing (local) @ [phone] Anticipated Caregiver: [name] and daughter, Danton Clap Ability/Limitations of Caregiver: spouse has own buisiness, lift for vehicles and dtr is RN with [employer]; they arrange 24/7 assist between them Caregiver Availability: 24/7 Family Dynamics: Pt describes spouse and daughter as very supportive and they have been caregivers for her really since Feb 2020.  Social History Preferred language: English Religion: Baptist Cultural Background: NA Read: Yes Write: Yes Employment Status: Retired Public relations account executive Issues: None Guardian/Conservator: None - per MD, pt is capable of making decisions on her own behalf.   Abuse/Neglect Abuse/Neglect Assessment Can Be Completed: Yes Physical Abuse: Denies Verbal Abuse: Denies Sexual Abuse: Denies Exploitation of patient/patient's resources: Denies Self-Neglect: Denies  Emotional Status Pt's affect, behavior and  adjustment status: Pt very pleasant and pleased to be here on CIR (vs SNF).  She admits much frustration with multiple health issues and several hospital and SNF admissions since Feb.  She is optimistic about out therapy program and being able to regain some independence.  Denies any significant emotional distress, however, will monitor and refer for neuropsychology if indicated. Recent Psychosocial Issues: As noted, pt has only been in her home ~ 5-6 weeks before returning to hospital and SNF since February which has been stressful for pt and family. Psychiatric History: Pt notes she does have h/o anxiety, depression but managed with medication. Substance Abuse History: NA  Patient / Family Perceptions, Expectations & Goals Pt/Family understanding of illness & functional limitations: Pt and family with good understanding of her medical issues and current level of debility/ need for CIR. Premorbid pt/family roles/activities: Pt did require  some assistance with ADLs but overall independent with mobility. Anticipated changes in roles/activities/participation: Little change anticipate if able to reach supervision goals.  Spouse and daughter to resume caregiver assist roles. Pt/family expectations/goals: "I just hope I can get a lot stronger.  Be less of a burden on my family."  [employer]: None Premorbid Home Care/DME Agencies: Other (Comment)([facility]) Transportation available at discharge: yes Resource referrals recommended: Neuropsychology  Discharge Planning Living Arrangements: Spouse/significant other Support Systems: Spouse/significant other, Children Type of Residence: Private residence Insurance Resources: [employer], Multimedia programmer (specify)(A[employer]) Financial Resources: Radio broadcast assistant Screen Referred: No Living Expenses: Medical laboratory scientific officer Management: Spouse Does the patient have any problems obtaining your medications?: No Home Management:  mostly spouse since Feb Patient/Family Preliminary Plans: Pt to d/c home with spouse and daughter providing 24/7 supervision/ assist Social Work Anticipated Follow Up Needs: HH/OP Expected length of stay: 7-12 days  Clinical Impression Very pleasant woman here for debility due to multiple medical issues possible subacute stroke and PNA.  Good family assistance from spouse and daughter who can resume 24/7 support.  Pt with ongoing medical issues since Feb with hospitalization and SNF x2.  Mood appears stable but does have h/o depression and anxiety - will monitor.    Keidrick Murty 09/15/2018, 10:14 AM

## 2018-09-15 NOTE — Care Management (Signed)
Inpatient [facility] Individual Statement of Services  Patient Name:  [name] [name]  Date:  09/15/2018  Welcome to the [facility].  Our goal is to provide you with an individualized program based on your diagnosis and situation, designed to meet your specific needs.  With this comprehensive rehabilitation program, you will be expected to participate in at least 3 hours of rehabilitation therapies Monday-Friday, with modified therapy programming on the weekends.  Your rehabilitation program will include the following services:  Physical Therapy (PT), Occupational Therapy (OT), 24 hour per day rehabilitation nursing, Therapeutic Recreaction (TR), Neuropsychology, Case Management (Social Worker), Rehabilitation Medicine, Nutrition Services and Pharmacy Services  Weekly team conferences will be held on Tuesdays to discuss your progress.  Your Social Worker will talk with you frequently to get your input and to update you on team discussions.  Team conferences with you and your family in attendance may also be held.  Expected length of stay: 7-12 days   Overall anticipated outcome: supervision  Depending on your progress and recovery, your program may change. Your Social Worker will coordinate services and will keep you informed of any changes. Your Social Worker's name and contact numbers are listed  below.  The following services may also be recommended but are not provided by the [facility] will be made to provide these services after discharge if needed.  Arrangements include referral to agencies that provide these services.  Your insurance has been verified to be:  Medicare; [place] primary doctor is:  Water engineer  Pertinent information will be shared with your doctor and your insurance company.  Social Worker:  [place],  [place] or ([phone]   Information discussed with and copy given to patient by: Lennart Pall, 09/15/2018, 10:17 AM

## 2018-09-16 ENCOUNTER — Inpatient Hospital Stay (HOSPITAL_COMMUNITY): Payer: Medicare Other | Admitting: Occupational Therapy

## 2018-09-16 ENCOUNTER — Inpatient Hospital Stay (HOSPITAL_COMMUNITY): Payer: Medicare Other | Admitting: Physical Therapy

## 2018-09-16 ENCOUNTER — Encounter (HOSPITAL_COMMUNITY): Payer: Self-pay | Admitting: Cardiology

## 2018-09-16 DIAGNOSIS — I951 Orthostatic hypotension: Secondary | ICD-10-CM

## 2018-09-16 LAB — GLUCOSE, CAPILLARY
Glucose-Capillary: 168 mg/dL — ABNORMAL HIGH (ref 70–99)
Glucose-Capillary: 181 mg/dL — ABNORMAL HIGH (ref 70–99)
Glucose-Capillary: 214 mg/dL — ABNORMAL HIGH (ref 70–99)
Glucose-Capillary: 193 mg/dL — ABNORMAL HIGH (ref 70–99)

## 2018-09-16 MED ORDER — PROCHLORPERAZINE MALEATE 5 MG PO TABS: 10.0000 mg | ORAL_TABLET | Freq: Four times a day (QID) | ORAL | Status: DC | PRN

## 2018-09-16 MED ORDER — ONDANSETRON 4 MG PO TBDP
4.0000 mg | ORAL_TABLET | Freq: Three times a day (TID) | ORAL | Status: DC | PRN
  Administered 2018-09-16: 4 mg via ORAL
  Filled 2018-09-16 (×2): qty 1

## 2018-09-16 NOTE — Progress Notes (Addendum)
Occupational Therapy Session Note  Patient Details  Name: [name] [name] [name]: [id number] Date of Birth: 05-26-1945  Today's Date: 09/16/2018 OT Individual Time: 1300-1414 OT Individual Time Calculation (min): 74 min   Short Term Goals: Week 1:  OT Short Term Goal 1 (Week 1): STGs equal to LTGs set at supervision level overall.  Skilled Therapeutic Interventions/Progress Updates:    Pt greeted in bed, eating lunch with no episodes of vomiting but still a little nauseous. With pt consent, provided her with an antinausea essential oil for wafting. She smelled this intermittently during meal. Afterwards pt wanted to get up to brush her teeth. Thigh high Teds and ACE wraps were already donned. OT donned footwear bedlevel, including her Lt ankle brace. Min A for supine<sit with vcs for slowing pace. Slideboard<w/c completed with Min A. Vcs provided for technique as well as diaphragmatic breathing and visualization strategies to promote calmness. When pt was transferred to w/c, OT asked her about dizziness. Pt reported she felt better than she felt all day. Pt then completed oral care and grooming tasks while sitting at the sink. She reported feeling well enough to leave the room. Pt self propelled ~195 ft in hallway with vcs for technique when traveling in straight path and when turning. No s/s dizziness during. Back in the room, pt transferred back to bed with Min A using the slideboard once again. Pt boosted herself up in bed using headboard and then rolled Rt>Lt with Min A for OT to lower clothing and place her on bedpan. Pt placed in chair position to increase ease of voiding. Opted for bedpan for safety vs toilet transfer. Pt understanding and verbalized she would call for NT when finished voiding. She was left in bed with all needs and bed alarm set. NT made aware of pts position.   Therapy Documentation Precautions:  Precautions Precautions: Fall Precaution Comments: hx of falls at home (~8 in  last 6 months) Restrictions Weight Bearing Restrictions: No Vital Signs: Therapy Vitals Temp: (!) 97.5 F (36.4 C) Temp Source: Oral Pulse Rate: (!) 54 Resp: 16 BP: 114/64 Patient Position (if appropriate): Lying Oxygen Therapy SpO2: 97 % O2 Device: Room Air Pain: Pain Assessment Pain Scale: 0-10 Pain Score: 0-No pain Pain Type: Acute pain Pain Location: Knee Pain Orientation: Right;Left Pain Descriptors / Indicators: Aching Pain Frequency: Intermittent Pain Onset: On-going Pain Intervention(s): Medication (See eMAR) ADL: ADL Eating: Independent Where Assessed-Eating: Wheelchair Grooming: Setup Where Assessed-Grooming: Wheelchair Upper Body Bathing: Supervision/safety Where Assessed-Upper Body Bathing: Wheelchair, Sitting at sink Lower Body Bathing: Minimal assistance Where Assessed-Lower Body Bathing: Wheelchair, Sitting at sink Upper Body Dressing: Minimal assistance Where Assessed-Upper Body Dressing: Sitting at sink, Wheelchair Lower Body Dressing: Minimal assistance Where Assessed-Lower Body Dressing: Wheelchair, Sitting at sink, Standing at sink Toileting: Minimal assistance Where Assessed-Toileting: Medical laboratory scientific officer: Minimal assistance Armed forces technical officer Method: Counselling psychologist: Raised toilet seat      Therapy/Group: Individual Therapy  Tymeir [place]athington A Shavawn Stobaugh 09/16/2018, 4:25 PM

## 2018-09-16 NOTE — Progress Notes (Signed)
PHYSICAL MEDICINE & REHABILITATION PROGRESS NOTE   Subjective/Complaints:   Dizziness with standing Ortho vitals show 76mmhg drop lying to standing  No CP, no dizziness in bed  Poor oral fluid intake outputs not measured  Has ACE wraps on LEs  ROS: Patient denies nausea, vomiting, diarrhea, cough, shortness of breath or chest pain,    Objective:   No results found. Recent Labs    09/14/18 0538  WBC 11.3*  HGB 12.3  HCT 38.6  PLT 196   Recent Labs    09/14/18 1242 09/15/18 0624  NA 131* 133*  K 3.3* 3.1*  CL 74* 81*  CO2 39* 36*  GLUCOSE 281* 184*  BUN 28* 29*  CREATININE 1.72* 1.68*  CALCIUM 9.9 9.5    Intake/Output Summary (Last 24 hours) at 09/16/2018 1039 Last data filed at 09/16/2018 0700 Gross per 24 hour  Intake 1253.31 ml  Output -  Net 1253.31 ml     Physical Exam: Vital Signs Blood pressure (!) 137/50, pulse (!) 58, temperature (!) 97.4 F (36.3 C), resp. rate 18, height 5' 2.5" (1.588 m), weight 106.4 kg, SpO2 98 %. Constitutional: No distress . Vital signs reviewed. HEENT: EOMI, oral membranes moist Neck: supple Cardiovascular: RRR without murmur. No JVD    Respiratory: CTA Bilaterally without wheezes or rales. Normal effort, O2NC GI: BS +, non-tender, non-distended, a few scattered abdominal bruises Musculoskeletal: tr to 1+ bilateral LE Neuro: mild left facial droop. Speech generally intelligible. Sl dysarthric. UE 4/5 prox to distal. LE 3/5 HF, KE and 4-/5 ADF/PF. No focal sensory changes.   Ext without edema Skin: Skin is warm. Rash (has psoriatic type rash above elbow on LUE) --stable  Left thigh, right back bruising Psychiatric: Pleasant and appropriate   Assessment/Plan: 1. Functional deficits secondary to debility after pneumonia, hx of right frontal CVA which require 3+ hours per day of interdisciplinary therapy in a comprehensive inpatient rehab setting.  Physiatrist is providing close team supervision and 24 hour  management of active medical problems listed below.  Physiatrist and rehab team continue to assess barriers to discharge/monitor patient progress toward functional and medical goals  Care Tool:  Bathing    Body parts bathed by patient: Right arm, Left arm, Chest, Abdomen, Front perineal area, Buttocks, Right upper leg, Left upper leg, Right lower leg, Face, Left lower leg         Bathing assist Assist Level: Minimal Assistance - Patient > 75%     Upper Body Dressing/Undressing Upper body dressing   What is the patient wearing?: Pull over shirt    Upper body assist Assist Level: Moderate Assistance - Patient 50 - 74%    Lower Body Dressing/Undressing Lower body dressing      What is the patient wearing?: Underwear/pull up     Lower body assist Assist for lower body dressing: Maximal Assistance - Patient 25 - 49%     Toileting Toileting    Toileting assist Assist for toileting: Maximal Assistance - Patient 25 - 49%     Transfers Chair/bed transfer  Transfers assist     Chair/bed transfer assist level: Minimal Assistance - Patient > 75%     Locomotion Ambulation   Ambulation assist      Assist level: Minimal Assistance - Patient > 75% Assistive device: Walker-rolling Max distance: 10'   Walk 10 feet activity   Assist     Assist level: Minimal Assistance - Patient > 75% Assistive device: Walker-rolling   Walk 50 feet  activity   Assist Walk 50 feet with 2 turns activity did not occur: Safety/medical concerns         Walk 150 feet activity   Assist Walk 150 feet activity did not occur: Safety/medical concerns         Walk 10 feet on uneven surface  activity   Assist Walk 10 feet on uneven surfaces activity did not occur: Safety/medical concerns         Wheelchair     Assist   Type of Wheelchair: Manual    Wheelchair assist level: Minimal Assistance - Patient > 75% Max wheelchair distance: 75    Wheelchair 50 feet  with 2 turns activity    Assist        Assist Level: Minimal Assistance - Patient > 75%   Wheelchair 150 feet activity     Assist  Wheelchair 150 feet activity did not occur: Safety/medical concerns       Blood pressure (!) 137/50, pulse (!) 58, temperature (!) 97.4 F (36.3 C), resp. rate 18, height 5' 2.5" (1.588 m), weight 106.4 kg, SpO2 98 %.  Medical Problem List and Plan:  1. Decreased functional mobility secondary to subacute to chronic right frontal lobe infarction/acute respiratory failure with sepsis/lobar pneumonia   -Continue CIR therapies today including PT and OT  2. Antithrombotics:  -DVT/anticoagulation: Continue Eliquis  -antiplatelet therapy: Continue Plavix 75 mg daily  3. Pain Management: Neurontin 600 mg twice daily, continue oxycodone 5 mg every 6 hours as needed moderate to severe pain/chronic back pain   -seems to be controlled at present 4. Mood: Cymbalta 30 mg daily, Xanax 0.5 mg twice daily, Wellbutrin 150 mg daily. Provide emotional support  -antipsychotic agents: N/A  5. Neuropsych: This patient is capable of making decisions on her own behalf.  6. Skin/Wound Care: Routine skin checks  7. Fluids/Electrolytes/Nutrition: Routine in and outs with follow-up chemistries  8. Diabetes mellitus. Hemoglobin A1c 7.0. NovoLog 4 units 3 times daily, Lantus insulin 32 units nightly.    CBG (last 3)  Recent Labs    09/15/18 1846 09/15/18 2129 09/16/18 0638  GLUCAP 199* 267* 168*   9. ID /MRSA PCR screening positive. levaquin finishes today 8/14  -wbc's 11k 10. CKD stage III. Admission creatinine 1.13 now at 1.68  BUN 29 , pt likely with pre renal azotemai 11. Asthma/OSA. Patient not on CPAP- refuses. Continue Dulera as well as nebulizer.   -pt used oxygen PTA  -on 2l currently  12. Hypertension. Toprol-XL 50 mg daily, Ranexa 1000 mg every 12 hours Norvasc 10 mg daily   Now with orthostatic hypotension ? Medication related along with poor  intake 13. Hypokalemia. K+ still at 3.1 today  -increase kdur 74meq tid over weekend 14. CAD with CABG. Continue Plavix. No increasing chest pain  15. Diastolic and systolic  congestive heart failure. Monitor for any signs of fluid overload. Last ECHO 03/2018 EF 25-30% . Continue Zaroxolyn 2.5 mg daily as well as fluid restriction-    Due to Orthostasis will relax fluid restrictions , ask Cardiology to eval  16. Hyperlipidemia. Zetia/Crestor  17. Urine retention: pvr's low  Not recorded making I/O s difficult to measure  18. Psoriasis? Vs eczema- can con't current steroid cream or try Betamethasone cream BID on LUE to help with rash.        LOS: 3 days A FACE TO [place] 09/16/2018, 10:39 AM

## 2018-09-16 NOTE — Progress Notes (Signed)
Physical Therapy Session Note  Patient Details  Name: [name] [name] [name]: [id number] Date of Birth: 1945-11-13  Today's Date: 09/16/2018 PT Individual Time: 0920-1015 AND 1105-1135 PT Individual Time Calculation (min): 55 min and 30 min   Short Term Goals: Week 1:  PT Short Term Goal 1 (Week 1): Pt will ambulate 79f with LRAD and CGA PT Short Term Goal 2 (Week 1): Pt will perform bed mobility with CGA. PT Short Term Goal 3 (Week 1): Pt will maintain standing balance with CGA x 3 min with functional activity PT Short Term Goal 4 (Week 1): Pt will propell WC 1555fwith supervision assist  Skilled Therapeutic Interventions/Progress Updates:   Session 1.  Pt received supine in bed and agreeable to PT. PT applied thigh high TEDs and ace wrap to the thighs. PT treatment focused on activity tolerance and BP management to prepare for OOB activity.  Supine>sit transfer with min assist for pelvic positioning. Orthostatic vital signs. Supine: 143/56 HR 56. Sitting 0 min 122/56/HR 57, mildly symptomatic. Sitting 3 minutes. 127/65, HR 64. Standing in stedy. 106/58, HR58. Standing 3 min 84/53, HR 110, significnaly symptomatic. Returned to sitting. 116/62. HR, 52. Pt with increased nausea requiring emesis bag. Once emesis episode completed,  performed Sit>supine completed with min assist at BLE and left supine in bed with call bell in reach and all needs met.     Session 2 Pt received supine in bed and agreeable to PT. PT obtained SB to allow pt to perform OOB PT and limit standing due to orthostatic hypotension. Supine>sit transfer with min assist and min cues for UE placement and safety. Pt noted to still have thight high ted hose and Ace wraps in place. Sitting BP taken, 127/68, HR 53, mildly symptomatic. As pt prepared to perform SB transfer she reports increased nausea and requests emesis bag. Small emesis episode sitting EOB with noted increase in pallor. Sit>supine completed with supervision assist.  [facility] min-mod assist and moderate cues for LE placement and  and left supine in bed with call bell in reach and all needs met.        Therapy Documentation Precautions:  Precautions Precautions: Fall Precaution Comments: hx of falls at home (~8 in last 6 months) Restrictions Weight Bearing Restrictions: No General: PT Amount of Missed Time (min): 30 Minutes PT Missed Treatment Reason: Patient fatigue;Patient ill (Comment)(n/v) Vital Signs: Therapy Vitals Resp: 18 Patient Position (if appropriate): Orthostatic Vitals Oxygen Therapy SpO2: 98 % O2 Device: Nasal Cannula O2 Flow Rate (L/min): 1 L/min Pain: Pain Assessment Pain Scale: 0-10 Pain Score: 0-No pain    Therapy/Group: Individual Therapy  AuLorie Phenix/15/2020, 10:15 AM

## 2018-09-16 NOTE — Consult Note (Signed)
Cardiology Consultation:   Patient ID: [name] [name] [name]: [id number]; DOB: 1945/12/12  Admit date: 09/13/2018 Date of Consult: 09/16/2018  Primary Care Provider: Derinda Late, MD Primary Cardiologist: Dr [name]   Patient Profile:   [name] [name] a 73 y.o. female with a hx of diabetes mellitus, hypertension, hyperlipidemia, prior CVA, obstructive sleep apnea, coronary artery disease status post coronary artery bypass and graft, chronic combined systolic/diastolic congestive heart failure who [name] being seen today for the evaluation of orthostatic hypotension and chronic combined systolic/diastolic congestive heart failure at the request of Darcus Pester MD.  History of Present Illness:   Patient [name] status post coronary artery bypass and graft in 2011.  Also with history of CVA and prior left carotid stent.  Patient had a prolonged hospitalization in February with pneumonia and respiratory failure.  There was also concern about amiodarone pulmonary toxicity and amiodarone was discontinued.  An echocardiogram at that time showed an ejection fraction 25 to 30% which was new compared to previous.  There was mid apical severe hypokinesis.  She also had possible ITP triggered by her infection that was treated with steroids.  Patient then fell at rehab following discharge and fractured her ankle.  She had surgical intervention April 2020.  Patient admitted August 2012 with transient slurred speech, dizziness, fevers and productive cough.  She was treated for pneumonia and acute CHF with antibiotics and diuresis.  She was seen by neurology and no change in medicines recommended.  She was transferred to inpatient rehab.  Today she complained of increasing dizziness with standing and was noted to be orthostatic with systolic blood pressure dropping by 40 points.  Cardiology now asked to evaluate.  Patient denies dyspnea though she continues with a mildly productive cough.  She has not had chest pain.   At time of recent admission she did have pedal edema which has completely resolved.  Heart Pathway Score:     Past Medical History:  Diagnosis Date   Abnormality of gait 05/14/2014   Anemia    hx   Anginal pain (HCC)    Anxiety    Asthma    Basal cell carcinoma 05/2014   "left shoulder"   Bundle branch block, left    chronic/notes 07/18/2013   CHF (congestive heart failure) ([place])    Chronic bronchitis ([place])    "off and on all the time" (07/18/2013)   Chronic insomnia 05/06/2015   Chronic kidney disease    frequency, sees dr Jamal Maes every 4 to 6 months (01/16/2018)   Chronic low back pain 08/24/2016   Chronic lower back pain    Claustrophobia    Common migraine 05/14/2014   Coronary artery disease    Depression    Dysrhythmia    GERD (gastroesophageal reflux disease)    H/O hiatal hernia    Headache    "at least 2/month" (01/16/2018)   Heart murmur    Hyperlipidemia    Hypertension    Irregular heart beat    Leg cramps    both legs at times   Melanoma ([place]) 05/2014   "burned off BLE" (01/16/2018)   Memory change 05/14/2014   Migraine    "5-6/year"  (01/16/2018)   Myocardial infarction ([place]) 04/1999, 02/2000, 01/2005; 2011; 2014   Neuromuscular disorder ([place])    ?   Obesity 01-2010   Obstructive sleep apnea    "can't wear machine; I have claustrophobia" (01/16/2018), states she had a 2nd sleep study and does not have sleep apnea,  her O2 decreases and now [name] on 2 L of O2 at night.   On home oxygen therapy    "2L at night and prn during daytime" (01/16/2018)   Osteoarthritis    "knees and hands" (01/16/2018)   Other and unspecified angina pectoris    Peripheral vascular disease ([place])    ? numbness, tingling arms and legs   Pneumonia 2000's   "once"   PONV (postoperative nausea and vomiting)    Shortness of breath    with exertion   Stroke ([place]) 03/22/12   right side brain; denies residual on 07/18/2013)   Stroke [facility])  Oct. 2015   Affected pt.s balance   Stroke ([place]) 10/2014   "affected my legs; fully recovered now"; still have sporatic memory issues from this one" (01/16/2018)   Type II diabetes mellitus (HCC)    Ventral hernia    hx of   Vomiting    persistent   Vomiting blood     Past Surgical History:  Procedure Laterality Date   ANKLE FRACTURE SURGERY Left 1970's   APPENDECTOMY  1970's   w/hysterectomy   BASAL CELL CARCINOMA EXCISION Left 05/2014   "shoulder" (01/16/2018)   CARDIAC CATHETERIZATION  10/10/2012   Dr [name].   CARDIAC CATHETERIZATION N/A 05/29/2015   Procedure: Right/Left Heart Cath and Coronary/Graft Angiography;  Surgeon: Larey Dresser, MD;  Location: [facility];  Service: Cardiovascular;  Laterality: N/A;   CAROTID ENDARTERECTOMY Left 03/2013   CAROTID STENT INSERTION Left 03/20/2013   Procedure: CAROTID STENT INSERTION;  Surgeon: [name], MD;  Location: [facility] CATH LAB;  Service: Cardiovascular;  Laterality: Left;  internal carotid   CEREBRAL ANGIOGRAM N/A 04/05/2011   Procedure: CEREBRAL ANGIOGRAM;  Surgeon: Angelia Mould, MD;  Location: [facility] CATH LAB;  Service: Cardiovascular;  Laterality: N/A;   CHOLECYSTECTOMY OPEN  2004   CORONARY ANGIOPLASTY WITH STENT PLACEMENT  01,02,05,06,07,08,11; 04/24/2013   "I've probably got ~ 10 stents by now" (04/24/2013)   CORONARY ANGIOPLASTY WITH STENT PLACEMENT  06/13/2013   "got 4 stents today" (06/13/2013)   CORONARY ARTERY BYPASS GRAFT  1220/11   "CABG X5"   CORONARY STENT INTERVENTION N/A 10/20/2017   Procedure: CORONARY STENT INTERVENTION;  Surgeon: Troy Sine, MD;  Location: [facility];  Service: Cardiovascular;  Laterality: N/A;   ESOPHAGOGASTRODUODENOSCOPY  08/03/2011   Procedure: ESOPHAGOGASTRODUODENOSCOPY (EGD);  Surgeon: Shann Medal, MD;  Location: [facility];  Service: General;  Laterality: N/A;   ESOPHAGOGASTRODUODENOSCOPY (EGD) WITH PROPOFOL N/A 03/11/2014   Procedure:  ESOPHAGOGASTRODUODENOSCOPY (EGD) WITH PROPOFOL;  Surgeon: Lafayette Dragon, MD;  Location: [facility];  Service: Endoscopy;  Laterality: N/A;   FRACTURE SURGERY     gall stone removal  05/2003   GASTRIC RESTRICTION SURGERY  1984   "stapeling"   HERNIA REPAIR  2004   "in my stomach; had OR on it twice", wire mesh on 1 hernia   LEFT HEART CATH AND CORS/GRAFTS ANGIOGRAPHY N/A 07/09/2016   Procedure: LEFT HEART CATH AND CORS/GRAFTS ANGIOGRAPHY;  Surgeon: Larey Dresser, MD;  Location: [facility];  Service: Cardiovascular;  Laterality: N/A;   ORIF ANKLE FRACTURE Left 05/16/2018   ORIF ANKLE FRACTURE Left 05/16/2018   Procedure: OPEN REDUCTION INTERNAL FIXATION (ORIF) Left ankle with possible syndesmosis fixation;  Surgeon: Nicholes Stairs, MD;  Location: [facility];  Service: Orthopedics;  Laterality: Left;  16min   OVARY SURGERY  1970's   "tumor removed"   PERCUTANEOUS CORONARY STENT INTERVENTION (PCI-S)  N/A 06/13/2013   Procedure: PERCUTANEOUS CORONARY STENT INTERVENTION (PCI-S);  Surgeon: [name], MD;  Location: [facility] CATH LAB;  Service: Cardiovascular;  Laterality: N/A;   PERCUTANEOUS STENT INTERVENTION N/A 04/24/2013   Procedure: PERCUTANEOUS STENT INTERVENTION;  Surgeon: [name], MD;  Location: [facility] CATH LAB;  Service: Cardiovascular;  Laterality: N/A;   RIGHT/LEFT HEART CATH AND CORONARY/GRAFT ANGIOGRAPHY N/A 10/20/2017   Procedure: RIGHT/LEFT HEART CATH AND CORONARY/GRAFT ANGIOGRAPHY;  Surgeon: Larey Dresser, MD;  Location: [place];  Service: Cardiovascular;  Laterality: N/A;   ROOT CANAL  10/2000   TIBIA FRACTURE SURGERY Right 1970's   rods and pins   TOOTH EXTRACTION     "1 on the upper; wisdom tooth on the lower" (01/16/2018)   TOTAL ABDOMINAL HYSTERECTOMY  1970's   w/ appendectomy     Inpatient Medications: Scheduled Meds:  ALPRAZolam  0.5 mg Oral BID   amLODipine  10 mg Oral Daily   apixaban  5 mg Oral BID   budesonide   0.25 mg Nebulization BID   buPROPion  150 mg Oral Daily   clopidogrel  75 mg Oral QHS   DULoxetine  30 mg Oral Daily   ezetimibe  10 mg Oral QHS   gabapentin  600 mg Oral BID   insulin aspart  0-9 Units Subcutaneous TID WC   insulin aspart  4 Units Subcutaneous TID WC   insulin glargine  32 Units Subcutaneous QHS   levofloxacin  500 mg Oral Q24H   Living Better with Heart Failure Book   Does not apply Once   mouth rinse  15 mL Mouth Rinse BID   metolazone  2.5 mg Oral Daily   metoprolol succinate  50 mg Oral Daily   mometasone-formoterol  2 puff Inhalation BID   multivitamin  1 tablet Oral Daily   pantoprazole  40 mg Oral Daily   potassium chloride  40 mEq Oral TID   ranolazine  1,000 mg Oral Q12H   rosuvastatin  40 mg Oral QHS   vitamin B-12  1,000 mcg Oral Daily   zolpidem  5 mg Oral QHS   Continuous Infusions:  PRN Meds: acetaminophen **OR** acetaminophen (TYLENOL) oral liquid 160 mg/5 mL **OR** acetaminophen, albuterol, bisacodyl, dextromethorphan-guaiFENesin, ipratropium-albuterol, nitroGLYCERIN, ondansetron, oxyCODONE, sorbitol  Allergies:    Allergies  Allergen Reactions   Amoxicillin Shortness Of Breath and Rash   Brilinta [Ticagrelor] Shortness Of Breath   Erythromycin Shortness Of Breath, Other (See Comments) and Hives    Trouble swallowing   Flagyl [Metronidazole] Shortness Of Breath and Palpitations   Penicillins Hives, Shortness Of Breath, Rash and Other (See Comments)    Has patient had a PCN reaction causing immediate rash, facial/tongue/throat swelling, SOB or lightheadedness with hypotension: Yes Has patient had a PCN reaction causing severe rash involving mucus membranes or skin necrosis: No Has patient had a PCN reaction that required hospitalization: Yes Has patient had a PCN reaction occurring within the last 10 years: No If all of the above answers are "NO", then may proceed with Cephalosporin use.    Isosorbide Mononitrate  [Isosorbide Nitrate] Other (See Comments)    Joint aches, muscles hurt, difficult to walk    Jardiance [Empagliflozin] Other (See Comments)    Nausea, joint aches, muscles aches   Metformin And Related Other (See Comments)    Stomach pain, cold sweats, joint pain, burred vision, dizziness   Tape Other (See Comments)    Skin pulls off with certain types Plastic tape causes  skin to rip if left on for long periods of time   Erythromycin Base Rash    Social History:   Social History   Socioeconomic History   Marital status: Married    Spouse name: [name]    Number of children: 1   Years of education: 12+   Highest education level: Not on file  Occupational History   Occupation: retired  Scientist, product/process development strain: Not on file   Food insecurity    Worry: Not on file    Inability: Not on Lexicographer needs    Medical: Not on file    Non-medical: Not on file  Tobacco Use   Smoking status: Never Smoker   Smokeless tobacco: Never Used  Substance and Sexual Activity   Alcohol use: Never    Frequency: Never   Drug use: Never   Sexual activity: Not Currently    Birth control/protection: Surgical    Comment: hysterectomy  Lifestyle   Physical activity    Days per week: Not on file    Minutes per session: Not on file   Stress: Not on file  Relationships   Social connections    Talks on phone: Not on file    Gets together: Not on file    Attends religious service: Not on file    Active member of club or organization: Not on file    Attends meetings of clubs or organizations: Not on file    Relationship status: Not on file   Intimate partner violence    Fear of current or ex partner: Not on file    Emotionally abused: Not on file    Physically abused: Not on file    Forced sexual activity: Not on file  Other Topics Concern   Not on file  Social History Narrative   Patient lives at home with husband [name].    Patient has 1  child.    Patient [name] not currently working.    Patient has 2 years of college.    Patient [name] right handed.   Patient drinks caffeine occasionally.    Family History:    Family History  Problem Relation Age of Onset   Heart disease Father        Heart Disease before age 72   Diabetes Father    Hyperlipidemia Father    Hypertension Father    Heart attack Father    Deep vein thrombosis Father    AAA (abdominal aortic aneurysm) Father    Hypertension Mother    Deep vein thrombosis Mother    AAA (abdominal aortic aneurysm) Mother    Cancer Sister        Ovarian   Hypertension Sister    Diabetes Paternal Grandmother    Heart disease Paternal Uncle    Diabetes Paternal Uncle    Heart attack Paternal Grandfather 60       died of MI at 59   Diabetes Paternal Aunt    Diabetes Paternal Uncle    Colon cancer Neg Hx      ROS:  Please see the history of present illness.  Patient describes continued productive cough but improved compared to recent admission.  She has some chest tightness with certain movements. All other ROS reviewed and negative.     Physical Exam/Data:   Vitals:   09/15/18 2116 09/16/18 0521 09/16/18 0819 09/16/18 1014  BP:  (!) 137/50    Pulse:  (!) 58    Resp:  19 18   Temp:  (!) 97.4 F (36.3 C)    TempSrc:      SpO2: 99% 94%  98%  Weight:  106.4 kg    Height:  5' 2.5" (1.588 m)      Intake/Output Summary (Last 24 hours) at 09/16/2018 1342 Last data filed at 09/16/2018 1124 Gross per 24 hour  Intake 1253.31 ml  Output 46 ml  Net 1207.31 ml   Last 3 Weights 09/16/2018 09/09/2018 05/16/2018  Weight (lbs) 234 lb 9.1 oz 256 lb 3.2 oz 252 lb  Weight (kg) 106.4 kg 116.212 kg 114.306 kg     Body mass index [name] 42.22 kg/m.  General:  Well nourished, well developed, in no acute distress HEENT: normal Lymph: no adenopathy Neck: no JVD Endocrine:  No thryomegaly Vascular: No carotid bruits; FA pulses 2+ bilaterally without bruits    Cardiac:  normal S1, S2; RRR; no murmur  Lungs:  clear to auscultation bilaterally, no wheezing, rhonchi or rales  Abd: soft, nontender, no hepatomegaly  Ext: Both lower extremities and Ace wraps.  No edema Musculoskeletal:  No deformities, BUE and BLE strength normal and equal Skin: warm and dry  Neuro:  CNs 2-12 intact, no focal abnormalities noted Psych:  Normal affect   EKG:  The EKG was personally reviewed and demonstrates: September 09, 2018-sinus rhythm with left bundle branch block.  Laboratory Data: Chemistry Recent Labs  Lab 09/14/18 0538 09/14/18 1242 09/15/18 0624  NA 134* 131* 133*  K 2.6* 3.3* 3.1*  CL 75* 74* 81*  CO2 42* 39* 36*  GLUCOSE 178* 281* 184*  BUN 29* 28* 29*  CREATININE 1.61* 1.72* 1.68*  CALCIUM 9.6 9.9 9.5  GFRNONAA 32* 30* 30*  GFRAA 37* 34* 35*  ANIONGAP 17* 18* 16*    Recent Labs  Lab 09/09/18 1440 09/10/18 0925 09/14/18 0538  PROT 6.7 6.7 6.9  ALBUMIN 3.4* 3.2* 2.9*  AST 18 17 40  ALT 15 15 32  ALKPHOS 94 86 104  BILITOT 1.8* 2.3* 1.4*   Hematology Recent Labs  Lab 09/12/18 0742 09/13/18 0317 09/14/18 0538  WBC 13.9* 10.9* 11.3*  RBC 3.77* 3.81* 4.39  HGB 10.7* 10.9* 12.3  HCT 34.5* 33.9* 38.6  MCV 91.5 89.0 87.9  MCH 28.4 28.6 28.0  MCHC 31.0 32.2 31.9  RDW 14.8 14.7 14.9  PLT 181 191 196   BNP Recent Labs  Lab 09/10/18 0925  BNP >4,500.0*     Assessment and Plan:   1. Orthostatic hypotension-patient describes dizziness with standing which [name] chronic since last October but worse recently.  She underwent diuresis during recent admission; Demadex and spironolactone were continued and daily metolazone added.  Her creatinine has increased and she [name] likely over diuresed.  I will hold Demadex, spironolactone and metolazone for now.  I would not hydrate and I will discontinue IV fluids.  We will likely resume Demadex and spironolactone in the next 48 to 72 hours but would not continue metolazone on a daily basis but could be  used as needed.  Follow blood pressure and adjust regimen as needed. 2. History of chronic combined systolic/diastolic congestive heart failure-patient apparently was volume overloaded prior to recent admission for pneumonia and CHF.  She [name] improved following diuresis.  Plan for diuretics as outlined above.  Her LV function was noted to be reduced on last echocardiogram but was felt potentially to be stress cardiomyopathy.  We will repeat echocardiogram to see if LV function has improved.  If LV  function does not improve would likely need cardiac catheterization in the future.  Will resume low-dose losartan and continue Toprol. 3. Coronary artery disease s/p CABG-continue Plavix and statin. 4. Hyperlipidemia-continue purulent, Zetia and low-dose Crestor. 5. Paroxysmal atrial fibrillation-patient remains in sinus rhythm on most recent ECG and on examination today.  Amiodarone was discontinued previously because of potential lung toxicity.  Continue beta-blocker.  Continue apixaban. 6. Chronic stage III kidney disease-renal function worse compared to creatinine at time of recent admission.  Will hold diuretics briefly as outlined above.  For questions or updates, please contact [place] Please consult [email or web address] for contact info under     Signed, [name], MD  09/16/2018 1:42 PM

## 2018-09-17 ENCOUNTER — Inpatient Hospital Stay (HOSPITAL_COMMUNITY): Payer: Medicare Other

## 2018-09-17 DIAGNOSIS — I340 Nonrheumatic mitral (valve) insufficiency: Secondary | ICD-10-CM

## 2018-09-17 DIAGNOSIS — I361 Nonrheumatic tricuspid (valve) insufficiency: Secondary | ICD-10-CM

## 2018-09-17 LAB — BASIC METABOLIC PANEL
Anion gap: 13 (ref 5–15)
BUN: 31 mg/dL — ABNORMAL HIGH (ref 8–23)
CO2: 29 mmol/L (ref 22–32)
Calcium: 9.1 mg/dL (ref 8.9–10.3)
Chloride: 89 mmol/L — ABNORMAL LOW (ref 98–111)
Creatinine, Ser: 1.87 mg/dL — ABNORMAL HIGH (ref 0.44–1.00)
GFR calc Af Amer: 31 mL/min — ABNORMAL LOW (ref >60)
GFR calc non Af Amer: 27 mL/min — ABNORMAL LOW (ref >60)
Glucose, Bld: 272 mg/dL — ABNORMAL HIGH (ref 70–99)
Potassium: 3.8 mmol/L (ref 3.5–5.1)
Sodium: 131 mmol/L — ABNORMAL LOW (ref 135–145)

## 2018-09-17 LAB — GLUCOSE, CAPILLARY
Glucose-Capillary: 258 mg/dL — ABNORMAL HIGH (ref 70–99)
Glucose-Capillary: 219 mg/dL — ABNORMAL HIGH (ref 70–99)
Glucose-Capillary: 180 mg/dL — ABNORMAL HIGH (ref 70–99)
Glucose-Capillary: 189 mg/dL — ABNORMAL HIGH (ref 70–99)

## 2018-09-17 LAB — ECHOCARDIOGRAM COMPLETE
Height: 62.5 in
Weight: 3710.78 oz

## 2018-09-17 MED ORDER — LOSARTAN POTASSIUM 50 MG PO TABS
25.0000 mg | ORAL_TABLET | Freq: Every day | ORAL | Status: DC
  Administered 2018-09-17: 25 mg via ORAL
  Filled 2018-09-17: qty 1

## 2018-09-17 MED ORDER — SODIUM CHLORIDE 0.9 % IV BOLUS
500.0000 mL | Freq: Once | INTRAVENOUS | Status: AC
  Administered 2018-09-17: 500 mL via INTRAVENOUS

## 2018-09-17 MED ORDER — SODIUM CHLORIDE 0.9 % IV BOLUS
1000.0000 mL | Freq: Once | INTRAVENOUS | Status: AC
  Administered 2018-09-17: 1000 mL via INTRAVENOUS

## 2018-09-17 MED ORDER — AMLODIPINE BESYLATE 5 MG PO TABS: 5.0000 mg | ORAL_TABLET | Freq: Every day | ORAL | Status: DC

## 2018-09-17 NOTE — Progress Notes (Signed)
Discussed with Dr Kalman Shan from cardiology. Pt has received fluid bolus  About 756ml thus far.  Per RN systolic BPs still in 61B.  Repeat ECHO showing improved Ejfx from  prior study in May 2020.  Thus far not responding to fluids as expected.  Per cardiology, recommending internal medicine consult.   Called on call [facility].  Await response. Charlett Blake M.D. [facility] FAAPM&R (Sports Med, Neuromuscular Med) Diplomate [place] Board of Electrodiagnostic Med

## 2018-09-17 NOTE — Care Plan (Signed)
Cardiology Care Plan:  Briefly, 73 year old woman with complex medical history initially admitted for stroke, also treated for sepsis due to lobar pneumonia. She had a reduced EF as low as 25-30% on prior echo that has recovered to 45-50% this admission. She is now in rehab and cardiology has been consulted for orthostatic hypotension with associated AKI that seems to be due to overdiuresis so diuretics have been held. This morning, amlodipine was decreased, low-dose losartan resumed, and metoprolol was continued. This evening she became persistently hypotensive with associated blurry vision, so I was contacted by her nurse to assess.   On my exam she is not in any distress. She is lying nearly flat around 20 degrees and JVP is just visible above the clavicle. Lungs are clear. Extremities are warm and she is well perfused. BP is 70s/40s currently and heart rate is 40s-50s after 500 mL NS.  Overall she does not have any evidence of cardiogenic shock and I agree that she may still be relatively hypovolemic. Her LV function at this point has nearly normalized and she should be able to tolerate IV fluid just fine. Medications may be contributing as well, though this would be a very profound response to such a low-dose ARB (especially after de-escalation of her amlodipine). For now, I would at least recommend additional IV fluid and discontinuation of all antihypertensives. Other etiologies of her hypotension should also be considered.

## 2018-09-17 NOTE — Significant Event (Signed)
Rapid Response Event Note  Overview: Time Called: 1901 Arrival Time: 1905 Event Type: Hypotension(SBP 77)  Initial Focused Assessment: Notified by staff that [name] [name] felt dizzy and blurry vision after getting off the bedpan. BP 77/54, HR 52. CBG 180. They notified my partner Puja and she directed to initiate a 250cc NS bolus and notify MD. Upon my arrival, [name] [name] was awake, oriented x4 lying in the bed in a trendelenburg position and the bolus was infusing. She denies CP and SOB. She states she has some tightness but said it was due to the position of the bed.  Her skin is pink warm and dry. BBS Clear with diminished bases. After 250cc NS bolus completed, HR 47, manual BP 90/42, RR 16 with sats 97% on RA.  Dr. Ella Bodo was notified of event by primary RN Alda Berthold and directed [place] to notify Cardiology. Awaiting Cardiology's response. Baseline BP trend for [name] [name] is approximately 105-120/50-60.  1935-manual BP 96/40 following 2nd bolus  Interventions: -NS bolus 250cc x2  Plan of Care: -Recheck VS following completion of additional 250 cc bolus -Monitor for further symptoms of orthostatic hypotension and follow precautions when moving patient  -Notify primary svc and/or RRRN for any further assistance  Event Summary: Name of Physician Notified: Dr. Ella Bodo and Cardiology on call at 1905    at    Outcome: Stayed in room and stabalized  Addendum: 2045 Dr. Harrington Challenger came to the bedside. Ordered additional NS to complete 1L NS. Will reassess at that time.  [name] [name]

## 2018-09-17 NOTE — Progress Notes (Signed)
Pt BP was 74/30, HR 47. Remains weak, denies dizziness, stated blurry vision is getting worst. Pt mentating. Answered all orientation questions correctly. Rapid Response RN updated. Dr. Harrington Challenger (Cards service) informed of patient status and will be here to check patient. Will monitor.

## 2018-09-17 NOTE — Progress Notes (Signed)
Progress Note  Patient Name: [name] [name] Date of Encounter: 09/17/2018  Primary Cardiologist: Dr [name]  Subjective   No CP or dyspnea; mild dizziness with standing  Inpatient Medications    Scheduled Meds: . ALPRAZolam  0.5 mg Oral BID  . amLODipine  10 mg Oral Daily  . apixaban  5 mg Oral BID  . budesonide  0.25 mg Nebulization BID  . buPROPion  150 mg Oral Daily  . clopidogrel  75 mg Oral QHS  . DULoxetine  30 mg Oral Daily  . ezetimibe  10 mg Oral QHS  . gabapentin  600 mg Oral BID  . insulin aspart  0-9 Units Subcutaneous TID WC  . insulin aspart  4 Units Subcutaneous TID WC  . insulin glargine  32 Units Subcutaneous QHS  . Living Better with Heart Failure Book   Does not apply Once  . mouth rinse  15 mL Mouth Rinse BID  . metoprolol succinate  50 mg Oral Daily  . mometasone-formoterol  2 puff Inhalation BID  . multivitamin  1 tablet Oral Daily  . pantoprazole  40 mg Oral Daily  . potassium chloride  40 mEq Oral TID  . ranolazine  1,000 mg Oral Q12H  . rosuvastatin  40 mg Oral QHS  . vitamin B-12  1,000 mcg Oral Daily  . zolpidem  5 mg Oral QHS   Continuous Infusions:  PRN Meds: acetaminophen **OR** acetaminophen (TYLENOL) oral liquid 160 mg/5 mL **OR** acetaminophen, albuterol, bisacodyl, dextromethorphan-guaiFENesin, ipratropium-albuterol, nitroGLYCERIN, ondansetron, oxyCODONE, sorbitol   Vital Signs    Vitals:   09/16/18 1454 09/16/18 1930 09/17/18 0455 09/17/18 0819  BP: 114/64 (!) 131/51 122/60   Pulse: (!) 54 (!) 52 (!) 50 (!) 53  Resp: 16 19 18 18   Temp: (!) 97.5 F (36.4 C) 98.2 F (36.8 C) 97.6 F (36.4 C)   TempSrc: Oral Oral Oral   SpO2: 97% 94% 99% 99%  Weight:   105.2 kg   Height:   5' 2.5" (1.588 m)     Intake/Output Summary (Last 24 hours) at 09/17/2018 0921 Last data filed at 09/16/2018 2200 Gross per 24 hour  Intake 600 ml  Output 46 ml  Net 554 ml   Last 3 Weights 09/17/2018 09/16/2018 09/09/2018  Weight (lbs) 231 lb 14.8 oz  234 lb 9.1 oz 256 lb 3.2 oz  Weight (kg) 105.2 kg 106.4 kg 116.212 kg      Physical Exam   GEN: No acute distress.   Neck: No JVD Cardiac: RRR, no murmurs, rubs, or gallops.  Respiratory: Clear to auscultation bilaterally. GI: Soft, nontender, non-distended  MS: No edema Neuro:  Nonfocal  Psych: Normal affect   Labs    Chemistry Recent Labs  Lab 09/10/18 0925  09/14/18 0538 09/14/18 1242 09/15/18 0624  NA 139   < > 134* 131* 133*  K 3.2*   < > 2.6* 3.3* 3.1*  CL 94*   < > 75* 74* 81*  CO2 29   < > 42* 39* 36*  GLUCOSE 306*   < > 178* 281* 184*  BUN 12   < > 29* 28* 29*  CREATININE 1.42*   < > 1.61* 1.72* 1.68*  CALCIUM 9.0   < > 9.6 9.9 9.5  PROT 6.7  --  6.9  --   --   ALBUMIN 3.2*  --  2.9*  --   --   AST 17  --  40  --   --  ALT 15  --  32  --   --   ALKPHOS 86  --  104  --   --   BILITOT 2.3*  --  1.4*  --   --   GFRNONAA 37*   < > 32* 30* 30*  GFRAA 43*   < > 37* 34* 35*  ANIONGAP 16*   < > 17* 18* 16*   < > = values in this interval not displayed.     Hematology Recent Labs  Lab 09/12/18 0742 09/13/18 0317 09/14/18 0538  WBC 13.9* 10.9* 11.3*  RBC 3.77* 3.81* 4.39  HGB 10.7* 10.9* 12.3  HCT 34.5* 33.9* 38.6  MCV 91.5 89.0 87.9  MCH 28.4 28.6 28.0  MCHC 31.0 32.2 31.9  RDW 14.8 14.7 14.9  PLT 181 191 196    BNP Recent Labs  Lab 09/10/18 0925  BNP >4,500.0*     Patient Profile     [name] [name] is a 73 y.o. female with a hx of diabetes mellitus, hypertension, hyperlipidemia, prior CVA, obstructive sleep apnea, coronary artery disease status post coronary artery bypass and graft, chronic combined systolic/diastolic congestive heart failure who is being seen today for the evaluation of orthostatic hypotension and chronic combined systolic/diastolic congestive heart failure  Assessment & Plan    1. Orthostatic hypotension-patient describes dizziness with standing which is chronic since last October but worse recently.  Remains orthostatic  today (SBP 122 to 80 with standing). She underwent diuresis during recent admission; demadex and spironolactone were continued and daily metolazone added.  Her creatinine increased and she was likely over diuresed. Continue to hold diuretics for now. She is not volume overloaded on examination. We will likely resume Demadex and spironolactone in the next 48 to 72 hours but would not continue metolazone on a daily basis but could be used as needed.  Follow blood pressure and adjust regimen as needed. 2. History of chronic combined systolic/diastolic congestive heart failure-patient apparently was volume overloaded prior to recent admission for pneumonia and CHF.  She is improved following diuresis.  Plan for diuretics as outlined above.  Her LV function was noted to be reduced on last echocardiogram but was felt potentially to be stress cardiomyopathy.  We will repeat echocardiogram to see if LV function has improved.  If LV function does not improve would likely need cardiac catheterization in the future.    Continue Toprol and resume low dose losartan.  Decrease amlodipine to 5 mg daily. 3. Coronary artery disease s/p CABG-continue Plavix and statin. 4. Hyperlipidemia-continue praulent, Zetia and Crestor. 5. Paroxysmal atrial fibrillation-patient remains in sinus rhythm on most recent ECG.  Amiodarone was discontinued previously because of potential lung toxicity.  Continue beta-blocker.  Continue apixaban. 6. Chronic stage III kidney disease-renal function worse compared to creatinine at time of recent admission.  Will hold diuretics briefly as outlined above. Recheck bmet.  For questions or updates, please contact [place] Please consult [email or web address] for contact info under        Signed, [name], MD  09/17/2018, 9:21 AM

## 2018-09-17 NOTE — Progress Notes (Signed)
[facility] PHYSICAL MEDICINE & REHABILITATION PROGRESS NOTE   Subjective/Complaints:   Appreciate cardiology assistance , remains orthostatic .  No other c/os Reviewed BMET today   ROS: Patient denies nausea, vomiting, diarrhea, cough, shortness of breath or chest pain,    Objective:   No results found. No results for input(s): WBC, HGB, HCT, PLT in the last 72 hours. Recent Labs    09/14/18 1242 09/15/18 0624  NA 131* 133*  K 3.3* 3.1*  CL 74* 81*  CO2 39* 36*  GLUCOSE 281* 184*  BUN 28* 29*  CREATININE 1.72* 1.68*  CALCIUM 9.9 9.5    Intake/Output Summary (Last 24 hours) at 09/17/2018 1033 Last data filed at 09/16/2018 2200 Gross per 24 hour  Intake 600 ml  Output 46 ml  Net 554 ml     Physical Exam: Vital Signs Blood pressure 122/60, pulse (!) 53, temperature 97.6 F (36.4 C), temperature source Oral, resp. rate 18, height 5' 2.5" (1.588 m), weight 105.2 kg, SpO2 99 %. Constitutional: No distress . Vital signs reviewed. HEENT: EOMI, oral membranes moist Neck: supple Cardiovascular: RRR without murmur. No JVD    Respiratory: CTA Bilaterally without wheezes or rales. Normal effort, O2NC GI: BS +, non-tender, non-distended, a few scattered abdominal bruises Musculoskeletal: tr to 1+ bilateral LE Neuro: mild left facial droop. Speech generally intelligible. Sl dysarthric. UE 4/5 prox to distal. LE 3/5 HF, KE and 4-/5 ADF/PF. No focal sensory changes.   Ext without edema Skin: Skin is warm. Rash (has psoriatic type rash above elbow on LUE) --stable  Left thigh, right back bruising Psychiatric: Pleasant and appropriate   Assessment/Plan: 1. Functional deficits secondary to debility after pneumonia, hx of right frontal CVA which require 3+ hours per day of interdisciplinary therapy in a comprehensive inpatient rehab setting.  Physiatrist is providing close team supervision and 24 hour management of active medical problems listed below.  Physiatrist and rehab  team continue to assess barriers to discharge/monitor patient progress toward functional and medical goals  Care Tool:  Bathing    Body parts bathed by patient: Right arm, Left arm, Chest, Abdomen, Front perineal area, Buttocks, Right upper leg, Left upper leg, Right lower leg, Face, Left lower leg         Bathing assist Assist Level: Minimal Assistance - Patient > 75%     Upper Body Dressing/Undressing Upper body dressing   What is the patient wearing?: Pull over shirt    Upper body assist Assist Level: Moderate Assistance - Patient 50 - 74%    Lower Body Dressing/Undressing Lower body dressing      What is the patient wearing?: Underwear/pull up     Lower body assist Assist for lower body dressing: Maximal Assistance - Patient 25 - 49%     Toileting Toileting    Toileting assist Assist for toileting: Maximal Assistance - Patient 25 - 49%     Transfers Chair/bed transfer  Transfers assist     Chair/bed transfer assist level: Minimal Assistance - Patient > 75%     Locomotion Ambulation   Ambulation assist      Assist level: Minimal Assistance - Patient > 75% Assistive device: Walker-rolling Max distance: 10'   Walk 10 feet activity   Assist     Assist level: Minimal Assistance - Patient > 75% Assistive device: Walker-rolling   Walk 50 feet activity   Assist Walk 50 feet with 2 turns activity did not occur: Safety/medical concerns  Walk 150 feet activity   Assist Walk 150 feet activity did not occur: Safety/medical concerns         Walk 10 feet on uneven surface  activity   Assist Walk 10 feet on uneven surfaces activity did not occur: Safety/medical concerns         Wheelchair     Assist   Type of Wheelchair: Manual    Wheelchair assist level: Minimal Assistance - Patient > 75% Max wheelchair distance: 75    Wheelchair 50 feet with 2 turns activity    Assist        Assist Level: Minimal  Assistance - Patient > 75%   Wheelchair 150 feet activity     Assist  Wheelchair 150 feet activity did not occur: Safety/medical concerns       Blood pressure 122/60, pulse (!) 53, temperature 97.6 F (36.4 C), temperature source Oral, resp. rate 18, height 5' 2.5" (1.588 m), weight 105.2 kg, SpO2 99 %.  Medical Problem List and Plan:  1. Decreased functional mobility secondary to subacute to chronic right frontal lobe infarction/acute respiratory failure with sepsis/lobar pneumonia   -Continue CIR therapies today including PT and OT - currently limited standing due to orthostatic hypotension which should improve in 1-2  More days with med changes  2. Antithrombotics:  -DVT/anticoagulation: Continue Eliquis  -antiplatelet therapy: Continue Plavix 75 mg daily  3. Pain Management: Neurontin 600 mg twice daily, continue oxycodone 5 mg every 6 hours as needed moderate to severe pain/chronic back pain   -seems to be controlled at present 4. Mood: Cymbalta 30 mg daily, Xanax 0.5 mg twice daily, Wellbutrin 150 mg daily. Provide emotional support  -antipsychotic agents: N/A  5. Neuropsych: This patient is capable of making decisions on her own behalf.  6. Skin/Wound Care: Routine skin checks  7. Fluids/Electrolytes/Nutrition: Routine in and outs with follow-up chemistries  8. Diabetes mellitus. Hemoglobin A1c 7.0. NovoLog 4 units 3 times daily, Lantus insulin 32 units nightly.    CBG (last 3)  Recent Labs    09/16/18 1640 09/16/18 2118 09/17/18 0608  GLUCAP 214* 193* 258*   9. ID /MRSA PCR screening positive. levaquin finishes today 8/14  -wbc's 11k 10. CKD stage III. Admission creatinine 1.13 now at 1.68  BUN 29 , pt likely with pre renal azotemai 11. Asthma/OSA. Patient not on CPAP- refuses. Continue Dulera as well as nebulizer.   -pt used oxygen PTA  -on 2l currently  12. Hypertension. Toprol-XL 50 mg daily, Ranexa 1000 mg every 12 hours Norvasc 10 mg daily   Now with  orthostatic hypotension likely volume depletion, per cards reduce amlodipine to 5mg   13. Hypokalemia. K+ improved to 3.8 today  -increase kdur 64meq tid - repeat BMET today  14. CAD with CABG. Continue Plavix. No increasing chest pain  15. Diastolic and systolic  congestive heart failure. Monitor for any signs of fluid overload. Last ECHO 03/2018 EF 25-30% . off Zaroxolyn 2.5 mg daily per cardiology  Due to Orthostasis will relax fluid restrictions , ask Cardiology to eval  16. Hyperlipidemia. Zetia/Crestor  17. Urine retention: pvr's low  Not recorded making I/O s difficult to measure  18. Psoriasis? Vs eczema- can con't current steroid cream or try Betamethasone cream BID on LUE to help with rash.        LOS: 4 days A FACE TO [place] 09/17/2018, 10:33 AM

## 2018-09-17 NOTE — Progress Notes (Signed)
  Echocardiogram 2D Echocardiogram has been performed.  [name] [name] 09/17/2018, 3:54 PM

## 2018-09-17 NOTE — Progress Notes (Signed)
Patient was getting off the bedpan and reported feeling dizzy, light headed and having blurry vision. B/p taken manually 77/54 P52. Blood sugar 180. Patient also complaining of tightening in the chest. Patient A+0x4 no SOB noted, lungs clear. Rapid response notified and came to assess patient. Orders to complete bolus of 0.9 NS of 250cc and they will reassess. MD Kirstein notified of issue. MD advised to call cardiologist MD Crenshaw.A message has been left for MD Crenshaw to notify of issue. Patient is stable and tolerating fluids well will continue to monitor and assess patient.

## 2018-09-17 NOTE — Significant Event (Signed)
Rapid Response Event Note  Overview: Hypotension  Initial Focused Assessment: I was called by 4W nurse about patient being hypotensive. Per nurse patient's SBP is in the 70s and patient was symptomatic (dizzy/lightheaded) and blurry vision. I asked the nurse if the patient had CHF and per the nurse, the last EF that she saw was 25%. I instructed the nurse to initiate a 250cc NS Bolus for now and that I would come up to see her as soon as I could. I was with another patient in an emergency. I sent the Night RR RN to the see the patient.   Call Time 1846   I did not see this patient.   Kinta Martis R

## 2018-09-18 ENCOUNTER — Inpatient Hospital Stay (HOSPITAL_COMMUNITY): Payer: Medicare Other | Admitting: Occupational Therapy

## 2018-09-18 ENCOUNTER — Inpatient Hospital Stay (HOSPITAL_COMMUNITY): Payer: Medicare Other

## 2018-09-18 ENCOUNTER — Observation Stay (HOSPITAL_COMMUNITY): Payer: Medicare Other

## 2018-09-18 ENCOUNTER — Inpatient Hospital Stay (HOSPITAL_COMMUNITY)
Admission: AD | Admit: 2018-09-18 | Discharge: 2018-09-21 | DRG: 312 | Disposition: A | Discharge status: Home / Self Care | Payer: Medicare Other | Source: Other Acute Inpatient Hospital | Attending: Internal Medicine | Admitting: Internal Medicine

## 2018-09-18 ENCOUNTER — Encounter (HOSPITAL_COMMUNITY): Payer: Medicare Other | Admitting: Psychology

## 2018-09-18 ENCOUNTER — Inpatient Hospital Stay (HOSPITAL_COMMUNITY): Payer: Medicare Other | Admitting: Physical Therapy

## 2018-09-18 DIAGNOSIS — T502X5A Adverse effect of carbonic-anhydrase inhibitors, benzothiadiazides and other diuretics, initial encounter: Secondary | ICD-10-CM | POA: Diagnosis present

## 2018-09-18 DIAGNOSIS — Z6841 Body Mass Index (BMI) 40.0 and over, adult: Secondary | ICD-10-CM | POA: Diagnosis not present

## 2018-09-18 DIAGNOSIS — I672 Cerebral atherosclerosis: Secondary | ICD-10-CM | POA: Diagnosis present

## 2018-09-18 DIAGNOSIS — N179 Acute kidney failure, unspecified: Secondary | ICD-10-CM | POA: Diagnosis present

## 2018-09-18 DIAGNOSIS — Z833 Family history of diabetes mellitus: Secondary | ICD-10-CM | POA: Diagnosis not present

## 2018-09-18 DIAGNOSIS — R001 Bradycardia, unspecified: Secondary | ICD-10-CM

## 2018-09-18 DIAGNOSIS — G8929 Other chronic pain: Secondary | ICD-10-CM | POA: Diagnosis present

## 2018-09-18 DIAGNOSIS — E669 Obesity, unspecified: Secondary | ICD-10-CM | POA: Diagnosis present

## 2018-09-18 DIAGNOSIS — I480 Paroxysmal atrial fibrillation: Secondary | ICD-10-CM | POA: Diagnosis present

## 2018-09-18 DIAGNOSIS — I447 Left bundle-branch block, unspecified: Secondary | ICD-10-CM | POA: Diagnosis present

## 2018-09-18 DIAGNOSIS — F418 Other specified anxiety disorders: Secondary | ICD-10-CM | POA: Diagnosis present

## 2018-09-18 DIAGNOSIS — I25810 Atherosclerosis of coronary artery bypass graft(s) without angina pectoris: Secondary | ICD-10-CM | POA: Diagnosis present

## 2018-09-18 DIAGNOSIS — G4733 Obstructive sleep apnea (adult) (pediatric): Secondary | ICD-10-CM | POA: Diagnosis present

## 2018-09-18 DIAGNOSIS — K219 Gastro-esophageal reflux disease without esophagitis: Secondary | ICD-10-CM | POA: Diagnosis present

## 2018-09-18 DIAGNOSIS — I2510 Atherosclerotic heart disease of native coronary artery without angina pectoris: Secondary | ICD-10-CM | POA: Diagnosis present

## 2018-09-18 DIAGNOSIS — Z9981 Dependence on supplemental oxygen: Secondary | ICD-10-CM | POA: Diagnosis not present

## 2018-09-18 DIAGNOSIS — Z90710 Acquired absence of both cervix and uterus: Secondary | ICD-10-CM

## 2018-09-18 DIAGNOSIS — M1990 Unspecified osteoarthritis, unspecified site: Secondary | ICD-10-CM | POA: Diagnosis present

## 2018-09-18 DIAGNOSIS — E876 Hypokalemia: Secondary | ICD-10-CM | POA: Diagnosis not present

## 2018-09-18 DIAGNOSIS — I25119 Atherosclerotic heart disease of native coronary artery with unspecified angina pectoris: Secondary | ICD-10-CM | POA: Diagnosis present

## 2018-09-18 DIAGNOSIS — Z7901 Long term (current) use of anticoagulants: Secondary | ICD-10-CM

## 2018-09-18 DIAGNOSIS — I5022 Chronic systolic (congestive) heart failure: Secondary | ICD-10-CM

## 2018-09-18 DIAGNOSIS — E785 Hyperlipidemia, unspecified: Secondary | ICD-10-CM | POA: Diagnosis present

## 2018-09-18 DIAGNOSIS — I639 Cerebral infarction, unspecified: Secondary | ICD-10-CM | POA: Diagnosis present

## 2018-09-18 DIAGNOSIS — R4781 Slurred speech: Secondary | ICD-10-CM | POA: Diagnosis present

## 2018-09-18 DIAGNOSIS — E1169 Type 2 diabetes mellitus with other specified complication: Secondary | ICD-10-CM | POA: Diagnosis not present

## 2018-09-18 DIAGNOSIS — F40240 Claustrophobia: Secondary | ICD-10-CM | POA: Diagnosis present

## 2018-09-18 DIAGNOSIS — I952 Hypotension due to drugs: Secondary | ICD-10-CM | POA: Diagnosis not present

## 2018-09-18 DIAGNOSIS — R0602 Shortness of breath: Secondary | ICD-10-CM

## 2018-09-18 DIAGNOSIS — N183 Chronic kidney disease, stage 3 unspecified: Secondary | ICD-10-CM | POA: Diagnosis present

## 2018-09-18 DIAGNOSIS — I252 Old myocardial infarction: Secondary | ICD-10-CM

## 2018-09-18 DIAGNOSIS — Z79891 Long term (current) use of opiate analgesic: Secondary | ICD-10-CM

## 2018-09-18 DIAGNOSIS — I959 Hypotension, unspecified: Secondary | ICD-10-CM

## 2018-09-18 DIAGNOSIS — Z794 Long term (current) use of insulin: Secondary | ICD-10-CM

## 2018-09-18 DIAGNOSIS — M545 Low back pain: Secondary | ICD-10-CM | POA: Diagnosis present

## 2018-09-18 DIAGNOSIS — E1151 Type 2 diabetes mellitus with diabetic peripheral angiopathy without gangrene: Secondary | ICD-10-CM | POA: Diagnosis present

## 2018-09-18 DIAGNOSIS — I130 Hypertensive heart and chronic kidney disease with heart failure and stage 1 through stage 4 chronic kidney disease, or unspecified chronic kidney disease: Secondary | ICD-10-CM | POA: Diagnosis present

## 2018-09-18 DIAGNOSIS — Z8349 Family history of other endocrine, nutritional and metabolic diseases: Secondary | ICD-10-CM

## 2018-09-18 DIAGNOSIS — Z20828 Contact with and (suspected) exposure to other viral communicable diseases: Secondary | ICD-10-CM | POA: Diagnosis present

## 2018-09-18 DIAGNOSIS — Z8249 Family history of ischemic heart disease and other diseases of the circulatory system: Secondary | ICD-10-CM

## 2018-09-18 DIAGNOSIS — Z85820 Personal history of malignant melanoma of skin: Secondary | ICD-10-CM

## 2018-09-18 DIAGNOSIS — E2740 Unspecified adrenocortical insufficiency: Secondary | ICD-10-CM | POA: Diagnosis present

## 2018-09-18 DIAGNOSIS — Z7902 Long term (current) use of antithrombotics/antiplatelets: Secondary | ICD-10-CM

## 2018-09-18 DIAGNOSIS — G9341 Metabolic encephalopathy: Secondary | ICD-10-CM | POA: Diagnosis present

## 2018-09-18 DIAGNOSIS — Z951 Presence of aortocoronary bypass graft: Secondary | ICD-10-CM | POA: Diagnosis not present

## 2018-09-18 DIAGNOSIS — Z79899 Other long term (current) drug therapy: Secondary | ICD-10-CM

## 2018-09-18 DIAGNOSIS — E1122 Type 2 diabetes mellitus with diabetic chronic kidney disease: Secondary | ICD-10-CM | POA: Diagnosis present

## 2018-09-18 DIAGNOSIS — L8990 Pressure ulcer of unspecified site, unspecified stage: Secondary | ICD-10-CM | POA: Insufficient documentation

## 2018-09-18 DIAGNOSIS — J9600 Acute respiratory failure, unspecified whether with hypoxia or hypercapnia: Secondary | ICD-10-CM | POA: Diagnosis present

## 2018-09-18 DIAGNOSIS — Z955 Presence of coronary angioplasty implant and graft: Secondary | ICD-10-CM

## 2018-09-18 DIAGNOSIS — Z7951 Long term (current) use of inhaled steroids: Secondary | ICD-10-CM

## 2018-09-18 DIAGNOSIS — I5042 Chronic combined systolic (congestive) and diastolic (congestive) heart failure: Secondary | ICD-10-CM

## 2018-09-18 DIAGNOSIS — G9340 Encephalopathy, unspecified: Secondary | ICD-10-CM | POA: Diagnosis present

## 2018-09-18 DIAGNOSIS — F5104 Psychophysiologic insomnia: Secondary | ICD-10-CM | POA: Diagnosis present

## 2018-09-18 DIAGNOSIS — I5033 Acute on chronic diastolic (congestive) heart failure: Secondary | ICD-10-CM | POA: Diagnosis not present

## 2018-09-18 DIAGNOSIS — T447X5A Adverse effect of beta-adrenoreceptor antagonists, initial encounter: Secondary | ICD-10-CM | POA: Diagnosis present

## 2018-09-18 DIAGNOSIS — Z808 Family history of malignant neoplasm of other organs or systems: Secondary | ICD-10-CM | POA: Diagnosis not present

## 2018-09-18 DIAGNOSIS — Z8701 Personal history of pneumonia (recurrent): Secondary | ICD-10-CM

## 2018-09-18 DIAGNOSIS — Z8673 Personal history of transient ischemic attack (TIA), and cerebral infarction without residual deficits: Secondary | ICD-10-CM

## 2018-09-18 DIAGNOSIS — R5381 Other malaise: Secondary | ICD-10-CM | POA: Diagnosis not present

## 2018-09-18 DIAGNOSIS — J45909 Unspecified asthma, uncomplicated: Secondary | ICD-10-CM | POA: Diagnosis present

## 2018-09-18 DIAGNOSIS — Z9049 Acquired absence of other specified parts of digestive tract: Secondary | ICD-10-CM

## 2018-09-18 DIAGNOSIS — Z452 Encounter for adjustment and management of vascular access device: Secondary | ICD-10-CM

## 2018-09-18 DIAGNOSIS — I951 Orthostatic hypotension: Secondary | ICD-10-CM | POA: Diagnosis not present

## 2018-09-18 LAB — COMPREHENSIVE METABOLIC PANEL
ALT: 19 U/L (ref 0–44)
AST: 23 U/L (ref 15–41)
Albumin: 2.5 g/dL — ABNORMAL LOW (ref 3.5–5.0)
Alkaline Phosphatase: 89 U/L (ref 38–126)
Anion gap: 10 (ref 5–15)
BUN: 43 mg/dL — ABNORMAL HIGH (ref 8–23)
CO2: 27 mmol/L (ref 22–32)
Calcium: 8.3 mg/dL — ABNORMAL LOW (ref 8.9–10.3)
Chloride: 93 mmol/L — ABNORMAL LOW (ref 98–111)
Creatinine, Ser: 3.14 mg/dL — ABNORMAL HIGH (ref 0.44–1.00)
GFR calc Af Amer: 17 mL/min — ABNORMAL LOW (ref >60)
GFR calc non Af Amer: 14 mL/min — ABNORMAL LOW (ref >60)
Glucose, Bld: 181 mg/dL — ABNORMAL HIGH (ref 70–99)
Potassium: 4 mmol/L (ref 3.5–5.1)
Sodium: 130 mmol/L — ABNORMAL LOW (ref 135–145)
Total Bilirubin: 0.8 mg/dL (ref 0.3–1.2)
Total Protein: 5.1 g/dL — ABNORMAL LOW (ref 6.5–8.1)

## 2018-09-18 LAB — CBC
HCT: 32.5 % — ABNORMAL LOW (ref 36.0–46.0)
HCT: 36.7 % (ref 36.0–46.0)
Hemoglobin: 10.4 g/dL — ABNORMAL LOW (ref 12.0–15.0)
Hemoglobin: 11.7 g/dL — ABNORMAL LOW (ref 12.0–15.0)
MCH: 28.1 pg (ref 26.0–34.0)
MCH: 28.2 pg (ref 26.0–34.0)
MCHC: 31.9 g/dL (ref 30.0–36.0)
MCHC: 32 g/dL (ref 30.0–36.0)
MCV: 88 fL (ref 80.0–100.0)
MCV: 88.1 fL (ref 80.0–100.0)
Platelets: 204 10*3/uL (ref 150–400)
Platelets: 226 10*3/uL (ref 150–400)
RBC: 3.69 MIL/uL — ABNORMAL LOW (ref 3.87–5.11)
RBC: 4.17 MIL/uL (ref 3.87–5.11)
RDW: 14.9 % (ref 11.5–15.5)
RDW: 15.5 % (ref 11.5–15.5)
WBC: 13.5 10*3/uL — ABNORMAL HIGH (ref 4.0–10.5)
WBC: 17.8 10*3/uL — ABNORMAL HIGH (ref 4.0–10.5)
nRBC: 0 % (ref 0.0–0.2)
nRBC: 0 % (ref 0.0–0.2)

## 2018-09-18 LAB — CORTISOL: Cortisol, Plasma: 6.8 ug/dL

## 2018-09-18 LAB — URINALYSIS, ROUTINE W REFLEX MICROSCOPIC
Bilirubin Urine: NEGATIVE
Glucose, UA: NEGATIVE mg/dL
Hgb urine dipstick: NEGATIVE
Ketones, ur: NEGATIVE mg/dL
Leukocytes,Ua: NEGATIVE
Nitrite: NEGATIVE
Protein, ur: 100 mg/dL — AB
Specific Gravity, Urine: 1.018 (ref 1.005–1.030)
pH: 5 (ref 5.0–8.0)

## 2018-09-18 LAB — BLOOD GAS, ARTERIAL
Acid-Base Excess: 4.4 mmol/L — ABNORMAL HIGH (ref 0.0–2.0)
Bicarbonate: 28.1 mmol/L — ABNORMAL HIGH (ref 20.0–28.0)
Drawn by: 50222
FIO2: 21
O2 Saturation: 93 %
Patient temperature: 98.6
pCO2 arterial: 39.9 mmHg (ref 32.0–48.0)
pH, Arterial: 7.461 — ABNORMAL HIGH (ref 7.350–7.450)
pO2, Arterial: 68.2 mmHg — ABNORMAL LOW (ref 83.0–108.0)

## 2018-09-18 LAB — BASIC METABOLIC PANEL
Anion gap: 15 (ref 5–15)
BUN: 46 mg/dL — ABNORMAL HIGH (ref 8–23)
CO2: 21 mmol/L — ABNORMAL LOW (ref 22–32)
Calcium: 8.8 mg/dL — ABNORMAL LOW (ref 8.9–10.3)
Chloride: 97 mmol/L — ABNORMAL LOW (ref 98–111)
Creatinine, Ser: 3.39 mg/dL — ABNORMAL HIGH (ref 0.44–1.00)
GFR calc Af Amer: 15 mL/min — ABNORMAL LOW (ref >60)
GFR calc non Af Amer: 13 mL/min — ABNORMAL LOW (ref >60)
Glucose, Bld: 204 mg/dL — ABNORMAL HIGH (ref 70–99)
Potassium: 4.3 mmol/L (ref 3.5–5.1)
Sodium: 133 mmol/L — ABNORMAL LOW (ref 135–145)

## 2018-09-18 LAB — GLUCOSE, CAPILLARY
Glucose-Capillary: 135 mg/dL — ABNORMAL HIGH (ref 70–99)
Glucose-Capillary: 181 mg/dL — ABNORMAL HIGH (ref 70–99)
Glucose-Capillary: 204 mg/dL — ABNORMAL HIGH (ref 70–99)
Glucose-Capillary: 195 mg/dL — ABNORMAL HIGH (ref 70–99)
Glucose-Capillary: 218 mg/dL — ABNORMAL HIGH (ref 70–99)

## 2018-09-18 LAB — TSH: TSH: 1.571 u[IU]/mL (ref 0.350–4.500)

## 2018-09-18 LAB — T4, FREE: Free T4: 1.6 ng/dL — ABNORMAL HIGH (ref 0.61–1.12)

## 2018-09-18 LAB — BRAIN NATRIURETIC PEPTIDE: B Natriuretic Peptide: 226.7 pg/mL — ABNORMAL HIGH (ref 0.0–100.0)

## 2018-09-18 LAB — TROPONIN I (HIGH SENSITIVITY): Troponin I (High Sensitivity): 15 ng/L (ref <18)

## 2018-09-18 LAB — PROCALCITONIN: Procalcitonin: <0.1 ng/mL

## 2018-09-18 LAB — LACTIC ACID, PLASMA: Lactic Acid, Venous: 1.6 mmol/L (ref 0.5–1.9)

## 2018-09-18 LAB — CORTISOL-AM, BLOOD: Cortisol - AM: 7.1 ug/dL (ref 6.7–22.6)

## 2018-09-18 MED ORDER — CLOPIDOGREL BISULFATE 75 MG PO TABS
75.0000 mg | ORAL_TABLET | Freq: Every day | ORAL | Status: DC
  Administered 2018-09-18 – 2018-09-21 (×4): 75 mg via ORAL
  Filled 2018-09-18 (×4): qty 1

## 2018-09-18 MED ORDER — ALBUTEROL SULFATE (2.5 MG/3ML) 0.083% IN NEBU
2.5000 mg | INHALATION_SOLUTION | RESPIRATORY_TRACT | Status: DC
  Administered 2018-09-18 (×5): 2.5 mg via RESPIRATORY_TRACT
  Filled 2018-09-18 (×5): qty 3

## 2018-09-18 MED ORDER — DOPAMINE-DEXTROSE 3.2-5 MG/ML-% IV SOLN
0.0000 ug/kg/min | INTRAVENOUS | Status: DC
  Administered 2018-09-18: 10 ug/kg/min via INTRAVENOUS
  Administered 2018-09-18: 5 ug/kg/min via INTRAVENOUS
  Administered 2018-09-18: 10 ug/kg/min via INTRAVENOUS
  Filled 2018-09-18 (×3): qty 250

## 2018-09-18 MED ORDER — ONDANSETRON HCL 4 MG/2ML IJ SOLN: 4.0000 mg | Freq: Three times a day (TID) | INTRAMUSCULAR | Status: DC | PRN

## 2018-09-18 MED ORDER — CHLORHEXIDINE GLUCONATE CLOTH 2 % EX PADS: 6.0000 | MEDICATED_PAD | Freq: Every day | CUTANEOUS | Status: DC

## 2018-09-18 MED ORDER — SODIUM CHLORIDE 0.9% FLUSH
10.0000 mL | Freq: Two times a day (BID) | INTRAVENOUS | Status: DC
  Administered 2018-09-18 – 2018-09-19 (×2): 30 mL
  Administered 2018-09-19 – 2018-09-21 (×4): 10 mL

## 2018-09-18 MED ORDER — ALPRAZOLAM 0.5 MG PO TABS
0.5000 mg | ORAL_TABLET | Freq: Two times a day (BID) | ORAL | Status: DC | PRN
  Administered 2018-09-20: 0.5 mg via ORAL
  Filled 2018-09-18: qty 1

## 2018-09-18 MED ORDER — APIXABAN 5 MG PO TABS
5.0000 mg | ORAL_TABLET | Freq: Two times a day (BID) | ORAL | Status: DC
  Administered 2018-09-18 – 2018-09-21 (×7): 5 mg via ORAL
  Filled 2018-09-18 (×7): qty 1

## 2018-09-18 MED ORDER — INSULIN ASPART 100 UNIT/ML ~~LOC~~ SOLN
0.0000 [IU] | Freq: Three times a day (TID) | SUBCUTANEOUS | Status: DC
  Administered 2018-09-18: 3 [IU] via SUBCUTANEOUS
  Administered 2018-09-18: 5 [IU] via SUBCUTANEOUS
  Administered 2018-09-18: 3 [IU] via SUBCUTANEOUS
  Administered 2018-09-19: 5 [IU] via SUBCUTANEOUS
  Administered 2018-09-19 (×2): 3 [IU] via SUBCUTANEOUS
  Administered 2018-09-20: 5 [IU] via SUBCUTANEOUS
  Administered 2018-09-20 (×2): 2 [IU] via SUBCUTANEOUS
  Administered 2018-09-21 (×2): 3 [IU] via SUBCUTANEOUS

## 2018-09-18 MED ORDER — ORAL CARE MOUTH RINSE
15.0000 mL | Freq: Two times a day (BID) | OROMUCOSAL | Status: DC
  Administered 2018-09-18 – 2018-09-19 (×4): 15 mL via OROMUCOSAL

## 2018-09-18 MED ORDER — BUDESONIDE 0.25 MG/2ML IN SUSP
0.2500 mg | Freq: Two times a day (BID) | RESPIRATORY_TRACT | Status: DC
  Administered 2018-09-18: 08:00:00 0.25 mg via RESPIRATORY_TRACT
  Filled 2018-09-18: qty 2

## 2018-09-18 MED ORDER — BUDESONIDE 0.5 MG/2ML IN SUSP
0.5000 mg | Freq: Two times a day (BID) | RESPIRATORY_TRACT | Status: DC
  Administered 2018-09-18 – 2018-09-20 (×4): 0.5 mg via RESPIRATORY_TRACT
  Filled 2018-09-18 (×4): qty 2

## 2018-09-18 MED ORDER — PANTOPRAZOLE SODIUM 40 MG PO TBEC
40.0000 mg | DELAYED_RELEASE_TABLET | Freq: Every day | ORAL | Status: DC
  Administered 2018-09-18 – 2018-09-20 (×3): 40 mg via ORAL
  Filled 2018-09-18 (×3): qty 1

## 2018-09-18 MED ORDER — ALBUTEROL SULFATE (2.5 MG/3ML) 0.083% IN NEBU
2.5000 mg | INHALATION_SOLUTION | Freq: Three times a day (TID) | RESPIRATORY_TRACT | Status: DC
  Administered 2018-09-19: 2.5 mg via RESPIRATORY_TRACT
  Filled 2018-09-18: qty 3

## 2018-09-18 MED ORDER — INSULIN GLARGINE 100 UNIT/ML ~~LOC~~ SOLN
32.0000 [IU] | Freq: Every day | SUBCUTANEOUS | Status: DC
  Administered 2018-09-18: 32 [IU] via SUBCUTANEOUS
  Filled 2018-09-18 (×2): qty 0.32

## 2018-09-18 MED ORDER — SODIUM CHLORIDE 0.9 % IV SOLN
INTRAVENOUS | Status: DC | PRN
  Administered 2018-09-18 (×2): via INTRAVENOUS

## 2018-09-18 MED ORDER — BUPROPION HCL ER (XL) 150 MG PO TB24
150.0000 mg | ORAL_TABLET | Freq: Every day | ORAL | Status: DC
  Administered 2018-09-18 – 2018-09-21 (×4): 150 mg via ORAL
  Filled 2018-09-18 (×4): qty 1

## 2018-09-18 MED ORDER — SODIUM CHLORIDE 0.9% FLUSH
10.0000 mL | INTRAVENOUS | Status: DC | PRN
  Administered 2018-09-18: 21:00:00 20 mL
  Filled 2018-09-18: qty 40

## 2018-09-18 MED ORDER — DM-GUAIFENESIN ER 30-600 MG PO TB12
1.0000 | ORAL_TABLET | Freq: Two times a day (BID) | ORAL | Status: DC
  Administered 2018-09-18 – 2018-09-21 (×7): 1 via ORAL
  Filled 2018-09-18 (×9): qty 1

## 2018-09-18 MED ORDER — DULOXETINE HCL 30 MG PO CPEP
30.0000 mg | ORAL_CAPSULE | Freq: Every day | ORAL | Status: DC
  Administered 2018-09-18 – 2018-09-21 (×4): 30 mg via ORAL
  Filled 2018-09-18 (×4): qty 1

## 2018-09-18 MED ORDER — SODIUM CHLORIDE 0.9 % IV BOLUS
1000.0000 mL | Freq: Once | INTRAVENOUS | Status: AC
  Administered 2018-09-18: 1000 mL via INTRAVENOUS

## 2018-09-18 MED ORDER — ROSUVASTATIN CALCIUM 20 MG PO TABS
40.0000 mg | ORAL_TABLET | Freq: Every day | ORAL | Status: DC
  Administered 2018-09-18 – 2018-09-20 (×3): 40 mg via ORAL
  Filled 2018-09-18 (×3): qty 2

## 2018-09-18 NOTE — H&P (Signed)
History and Physical    [name] [name] [id number]:[id number] DOB: 04-27-1945 DOA: 09/18/2018  PCP: Derinda Late, MD Patient coming from: [facility] [phone])  Chief Complaint: Lightheadedness  HPI: [name] [name] is a 73 y.o. female with medical history significant of hypertension, lipidemia, type 2 diabetes, asthma, stroke, GERD, depression, anxiety, PVD, OSA not on CPAP, CAD status post PCI and CABG, systolic CHF with EF, atrial fibrillation on Eliquis, left bundle branch block, CKD stage III admitted 8/8-8/12 for bronchopneumonia and slurred speech which is thought to be related to recrudescence of previous symptoms due to her pneumonia.  She was started on diuresis and antibiotics.  Discharged to CIR.  Patient became hypotensive with systolic in the 41P and diastolic in the 37T.  Cardiology was consulted on 8/15.  Demadex and spironolactone were continued during her recent hospitalization and daily metolazone was added for volume overload.  Creatinine had increased.  Her hypotension was thought to be due to overdiuresis.  Diuretics were stopped by cardiology and low-dose losartan and Toprol were continued.  Dose of amlodipine was decreased from 10 mg to 5 mg yesterday. patient continued to be hypotensive throughout the day yesterday and was seen by cardiology.  Repeat echocardiogram was done which revealed improvement of her EF from 25 to 30% previously to 45 to 50% at present.  Cardiology did not feel her hypotension was secondary to cardiogenic shock.  Cardiology discontinued losartan, amlodipine, and metoprolol.  Patient received a total of 1 L IV fluid yesterday evening but continued to be hypotensive with systolic in the 02I and diastolic in the 09B.  In addition bradycardic with heart rate ranging from 30s to 40s.  [facility] consulted.  Patient was seen and examined at bedside.  She was resting comfortably watching television.  Stated her blood pressure has been dropping and she has been having intermittent  dizziness and blurring of vision.  Denies any chest pain, shortness of breath, or abdominal pain.  No other complaints.  Review of Systems:  All systems reviewed and apart from history of presenting illness, are negative.  Past Medical History:  Diagnosis Date  . Abnormality of gait 05/14/2014  . Anemia    hx  . Anginal pain ([place])   . Anxiety   . Asthma   . Basal cell carcinoma 05/2014   "left shoulder"  . Bundle branch block, left    chronic/notes 07/18/2013  . CHF (congestive heart failure) ([place])   . Chronic bronchitis ([place])    "off and on all the time" (07/18/2013)  . Chronic insomnia 05/06/2015  . Chronic kidney disease    frequency, sees dr Jamal Maes every 4 to 6 months (01/16/2018)  . Chronic low back pain 08/24/2016  . Chronic lower back pain   . Claustrophobia   . Common migraine 05/14/2014  . Coronary artery disease   . Depression   . Dysrhythmia   . GERD (gastroesophageal reflux disease)   . H/O hiatal hernia   . Headache    "at least 2/month" (01/16/2018)  . Heart murmur   . Hyperlipidemia   . Hypertension   . Irregular heart beat   . Leg cramps    both legs at times  . Melanoma ([place]) 05/2014   "burned off BLE" (01/16/2018)  . Memory change 05/14/2014  . Migraine    "5-6/year"  (01/16/2018)  . Myocardial infarction ([place]) 04/1999, 02/2000, 01/2005; 2011; 2014  . Neuromuscular disorder ([place])    ?  . Obesity 01-2010  . Obstructive sleep  apnea    "can't wear machine; I have claustrophobia" (01/16/2018), states she had a 2nd sleep study and does not have sleep apnea, her O2 decreases and now is on 2 L of O2 at night.  . On home oxygen therapy    "2L at night and prn during daytime" (01/16/2018)  . Osteoarthritis    "knees and hands" (01/16/2018)  . Other and unspecified angina pectoris   . Peripheral vascular disease (HCC)    ? numbness, tingling arms and legs  . Pneumonia 2000's   "once"  . PONV (postoperative nausea and vomiting)   . Shortness of breath     with exertion  . Stroke ([place]) 03/22/12   right side brain; denies residual on 07/18/2013)  . Stroke [facility]) Oct. 2015   Affected pt.s balance  . Stroke [facility]) 10/2014   "affected my legs; fully recovered now"; still have sporatic memory issues from this one" (01/16/2018)  . Type II diabetes mellitus ([place])   . Ventral hernia    hx of  . Vomiting    persistent  . Vomiting blood     Past Surgical History:  Procedure Laterality Date  . ANKLE FRACTURE SURGERY Left 1970's  . APPENDECTOMY  1970's   w/hysterectomy  . BASAL CELL CARCINOMA EXCISION Left 05/2014   "shoulder" (01/16/2018)  . CARDIAC CATHETERIZATION  10/10/2012   Dr [name].  Marland Kitchen CARDIAC CATHETERIZATION N/A 05/29/2015   Procedure: Right/Left Heart Cath and Coronary/Graft Angiography;  Surgeon: Larey Dresser, MD;  Location: [place] CV [facility];  Service: Cardiovascular;  Laterality: N/A;  . CAROTID ENDARTERECTOMY Left 03/2013  . CAROTID STENT INSERTION Left 03/20/2013   Procedure: CAROTID STENT INSERTION;  Surgeon: [name], MD;  Location: [facility] CATH LAB;  Service: Cardiovascular;  Laterality: Left;  internal carotid  . CEREBRAL ANGIOGRAM N/A 04/05/2011   Procedure: CEREBRAL ANGIOGRAM;  Surgeon: Angelia Mould, MD;  Location: [facility] CATH LAB;  Service: Cardiovascular;  Laterality: N/A;  . CHOLECYSTECTOMY OPEN  2004  . CORONARY ANGIOPLASTY WITH STENT PLACEMENT  01,02,05,06,07,08,11; 04/24/2013   "I've probably got ~ 10 stents by now" (04/24/2013)  . CORONARY ANGIOPLASTY WITH STENT PLACEMENT  06/13/2013   "got 4 stents today" (06/13/2013)  . CORONARY ARTERY BYPASS GRAFT  1220/11   "CABG X5"  . CORONARY STENT INTERVENTION N/A 10/20/2017   Procedure: CORONARY STENT INTERVENTION;  Surgeon: Troy Sine, MD;  Location: [place] CV [facility];  Service: Cardiovascular;  Laterality: N/A;  . ESOPHAGOGASTRODUODENOSCOPY  08/03/2011   Procedure: ESOPHAGOGASTRODUODENOSCOPY (EGD);  Surgeon: Shann Medal, MD;  Location: [facility];  Service:  General;  Laterality: N/A;  . ESOPHAGOGASTRODUODENOSCOPY (EGD) WITH PROPOFOL N/A 03/11/2014   Procedure: ESOPHAGOGASTRODUODENOSCOPY (EGD) WITH PROPOFOL;  Surgeon: Lafayette Dragon, MD;  Location: [facility] ENDOSCOPY;  Service: Endoscopy;  Laterality: N/A;  . FRACTURE SURGERY    . gall stone removal  05/2003  . GASTRIC RESTRICTION SURGERY  1984   "stapeling"  . HERNIA REPAIR  2004   "in my stomach; had OR on it twice", wire mesh on 1 hernia  . LEFT HEART CATH AND CORS/GRAFTS ANGIOGRAPHY N/A 07/09/2016   Procedure: LEFT HEART CATH AND CORS/GRAFTS ANGIOGRAPHY;  Surgeon: Larey Dresser, MD;  Location: [place] [facility];  Service: Cardiovascular;  Laterality: N/A;  . ORIF ANKLE FRACTURE Left 05/16/2018  . ORIF ANKLE FRACTURE Left 05/16/2018   Procedure: OPEN REDUCTION INTERNAL FIXATION (ORIF) Left ankle with possible syndesmosis fixation;  Surgeon: Nicholes Stairs, MD;  Location: [place];  Service: Orthopedics;  Laterality: Left;  155min  . OVARY SURGERY  1970's   "tumor removed"  . PERCUTANEOUS CORONARY STENT INTERVENTION (PCI-S) N/A 06/13/2013   Procedure: PERCUTANEOUS CORONARY STENT INTERVENTION (PCI-S);  Surgeon: [name], MD;  Location: [facility] CATH LAB;  Service: Cardiovascular;  Laterality: N/A;  . PERCUTANEOUS STENT INTERVENTION N/A 04/24/2013   Procedure: PERCUTANEOUS STENT INTERVENTION;  Surgeon: [name], MD;  Location: [facility] C[facility] LAB;  Service: Cardiovascular;  Laterality: N/A;  . RIGHT/LEFT HEART CATH AND CORONARY/GRAFT ANGIOGRAPHY N/A 10/20/2017   Procedure: RIGHT/LEFT HEART CATH AND CORONARY/GRAFT ANGIOGRAPHY;  Surgeon: Larey Dresser, MD;  Location: [place] CV LAB;  Service: Cardiovascular;  Laterality: N/A;  . ROOT CANAL  10/2000  . TIBIA FRACTURE SURGERY Right 1970's   rods and pins  . TOOTH EXTRACTION     "1 on the upper; wisdom tooth on the lower" (01/16/2018)  . TOTAL ABDOMINAL HYSTERECTOMY  1970's   w/ appendectomy     reports that she has never smoked. She has  never used smokeless tobacco. She reports that she does not drink alcohol or use drugs.  Allergies  Allergen Reactions  . Amoxicillin Shortness Of Breath and Rash  . Brilinta [Ticagrelor] Shortness Of Breath  . Erythromycin Shortness Of Breath, Other (See Comments) and Hives    Trouble swallowing  . Flagyl [Metronidazole] Shortness Of Breath and Palpitations  . Penicillins Hives, Shortness Of Breath, Rash and Other (See Comments)    Has patient had a PCN reaction causing immediate rash, facial/tongue/throat swelling, SOB or lightheadedness with hypotension: Yes Has patient had a PCN reaction causing severe rash involving mucus membranes or skin necrosis: No Has patient had a PCN reaction that required hospitalization: Yes Has patient had a PCN reaction occurring within the last 10 years: No If all of the above answers are "NO", then may proceed with Cephalosporin use.   . Isosorbide Mononitrate [Isosorbide Nitrate] Other (See Comments)    Joint aches, muscles hurt, difficult to walk   . Jardiance [Empagliflozin] Other (See Comments)    Nausea, joint aches, muscles aches  . Metformin And Related Other (See Comments)    Stomach pain, cold sweats, joint pain, burred vision, dizziness  . Tape Other (See Comments)    Skin pulls off with certain types Plastic tape causes skin to rip if left on for long periods of time  . Erythromycin Base Rash    Family History  Problem Relation Age of Onset  . Heart disease Father        Heart Disease before age 46  . Diabetes Father   . Hyperlipidemia Father   . Hypertension Father   . Heart attack Father   . Deep vein thrombosis Father   . AAA (abdominal aortic aneurysm) Father   . Hypertension Mother   . Deep vein thrombosis Mother   . AAA (abdominal aortic aneurysm) Mother   . Cancer Sister        Ovarian  . Hypertension Sister   . Diabetes Paternal Grandmother   . Heart disease Paternal Uncle   . Diabetes Paternal Uncle   . Heart  attack Paternal Grandfather 32       died of MI at 32  . Diabetes Paternal Aunt   . Diabetes Paternal Uncle   . Colon cancer Neg Hx     Prior to Admission medications   Medication Sig Start Date End Date Taking? Authorizing Provider  ADVAIR HFA 115-21 MCG/ACT inhaler Inhale 2 puffs into the  lungs. 08/02/18   [provider]  albuterol (VENTOLIN HFA) 108 (90 Base) MCG/ACT inhaler Inhale 2 puffs into the lungs every 6 (six) hours as needed for wheezing.    [provider]  Alirocumab (PRALUENT) 150 MG/ML SOAJ Inject 1 pen into the skin every 14 (fourteen) days. 08/18/18   Larey Dresser, MD  ALPRAZolam Duanne Moron) 0.5 MG tablet Take 1 tablet (0.5 mg total) by mouth 2 (two) times daily. 04/10/18   Cherene Altes, MD  amLODipine (NORVASC) 10 MG tablet Take 10 mg by mouth daily.    [provider]  apixaban (ELIQUIS) 5 MG TABS tablet Take 1 tablet (5 mg total) by mouth 2 (two) times daily. 05/12/18   Larey Dresser, MD  bisacodyl (DULCOLAX) 5 MG EC tablet Take 1 tablet (5 mg total) by mouth daily as needed for moderate constipation. 05/19/18 05/19/19  Nicholes Stairs, MD  budesonide (PULMICORT) 0.25 MG/2ML nebulizer solution Take 2 mLs (0.25 mg total) by nebulization 2 (two) times daily. 04/10/18   Cherene Altes, MD  buPROPion (WELLBUTRIN XL) 150 MG 24 hr tablet Take 150 mg by mouth daily.    [provider]  clopidogrel (PLAVIX) 75 MG tablet Take 1 tablet (75 mg total) by mouth at bedtime. 05/12/18   Larey Dresser, MD  denosumab (PROLIA) 60 MG/ML SOSY injection Inject 69 mg into the skin every 6 (six) months.    [provider]  DULoxetine (CYMBALTA) 30 MG capsule Take 1 capsule (30 mg total) by mouth daily. 04/11/18   Cherene Altes, MD  ezetimibe (ZETIA) 10 MG tablet Take 1 tablet (10 mg total) by mouth at bedtime. 04/10/18   Cherene Altes, MD  gabapentin (NEURONTIN) 300 MG capsule Take 2 capsules (600 mg total) by mouth 2 (two) times  daily. 04/10/18   Cherene Altes, MD  insulin aspart (NOVOLOG) 100 UNIT/ML injection Inject 8 Units into the skin 3 (three) times daily with meals. Patient taking differently: Inject 20 Units into the skin 3 (three) times daily.  04/10/18   Cherene Altes, MD  insulin glargine (LANTUS) 100 UNIT/ML injection Inject 0.35 mLs (35 Units total) into the skin at bedtime. Patient taking differently: Inject 38 Units into the skin at bedtime.  04/10/18   Cherene Altes, MD  ipratropium-albuterol (DUONEB) 0.5-2.5 (3) MG/3ML SOLN Take 3 mLs by nebulization 2 (two) times daily. Patient taking differently: Take 3 mLs by nebulization 2 (two) times daily as needed (shortness of breath).  04/10/18   Cherene Altes, MD  metolazone (ZAROXOLYN) 2.5 MG tablet Take 2.5 mg by mouth daily.    [provider]  metoprolol succinate (TOPROL-XL) 50 MG 24 hr tablet Take 25mg  daily with or immediately following a meal. 08/29/18   Larey Dresser, MD  Multiple Vitamins-Minerals (HEALTHY EYES/LUTEIN) TABS Take 1 tablet by mouth daily.    [provider]  nitroGLYCERIN (NITROSTAT) 0.3 MG SL tablet Place 1 tablet (0.3 mg total) under the tongue every 5 (five) minutes x 3 doses as needed for chest pain. 09/15/17   Georgiana Shore, NP  ondansetron (ZOFRAN) 4 MG tablet Take 4 mg by mouth every 6 (six) hours as needed for nausea/vomiting.    [provider]  oxyCODONE (OXY IR/ROXICODONE) 5 MG immediate release tablet Take 1 tablet (5 mg total) by mouth every 4 (four) hours as needed for moderate pain or severe pain. Patient not taking: Reported on 09/09/2018 05/19/18   Nicholes Stairs,  MD  OXYGEN Inhale 2 L into the lungs at bedtime.     [provider]  pantoprazole (PROTONIX) 40 MG tablet Take 1 tablet (40 mg total) by mouth daily. 04/11/18   Cherene Altes, MD  potassium chloride SA (K-DUR) 20 MEQ tablet TAKE ONE (1) TABLET BY MOUTH TWO (2) TIMES DAILY 09/12/18   Larey Dresser, MD   ranolazine (RANEXA) 1000 MG SR tablet Take 1,000 mg by mouth every 12 (twelve) hours.    [provider]  rosuvastatin (CRESTOR) 40 MG tablet Take 1 tablet (40 mg total) by mouth at bedtime. 04/10/18   Cherene Altes, MD  spironolactone (ALDACTONE) 25 MG tablet Take 1 tablet (25 mg total) by mouth daily. Patient not taking: Reported on 09/09/2018 04/11/18   Cherene Altes, MD  torsemide (DEMADEX) 20 MG tablet Take 1 tablet (20 mg total) by mouth every other day. 06/02/18   Georgiana Shore, NP  traMADol (ULTRAM) 50 MG tablet Take 1 tablet (50 mg total) by mouth every 6 (six) hours as needed for moderate pain. Patient not taking: Reported on 09/09/2018 05/19/18   Nicholes Stairs, MD  triamcinolone cream (KENALOG) 0.1 % Apply 1 application topically daily. 08/02/18   [provider]  vitamin B-12 (CYANOCOBALAMIN) 1000 MCG tablet Take 1,000 mcg by mouth daily.    [provider]  zolpidem (AMBIEN) 10 MG tablet Take 10 mg by mouth every evening.    [provider]    Physical Exam: Vitals:   09/18/18 0116  BP: (!) 76/40  Pulse: (!) 43  Resp: (!) 22  SpO2: 95%    Physical Exam  Constitutional: She is oriented to person, place, and time. She appears well-developed and well-nourished. No distress.  HENT:  Head: Normocephalic.  Mouth/Throat: Oropharynx is clear and moist.  Eyes: Right eye exhibits no discharge. Left eye exhibits no discharge.  Neck: Neck supple.  Cardiovascular: Regular rhythm and intact distal pulses.  Bradycardic  Pulmonary/Chest: Effort normal. No respiratory distress. She has no wheezes.  Abdominal: Soft. Bowel sounds are normal. She exhibits no distension. There is no abdominal tenderness. There is no guarding.  Musculoskeletal:        General: No edema.  Neurological: She is alert and oriented to person, place, and time.  Skin: Skin is warm and dry. She is not diaphoretic.     Labs on Admission: I have personally reviewed  following labs and imaging studies  CBC: Recent Labs  Lab 09/12/18 0309 09/12/18 0742 09/13/18 0317 09/14/18 0538 09/18/18 0023  WBC 15.0* 13.9* 10.9* 11.3* 13.5*  NEUTROABS  --   --   --  8.8*  --   HGB 10.9* 10.7* 10.9* 12.3 10.4*  HCT 35.2* 34.5* 33.9* 38.6 32.5*  MCV 92.1 91.5 89.0 87.9 88.1  PLT 192 181 191 196 700   Basic Metabolic Panel: Recent Labs  Lab 09/11/18 0647  09/14/18 0538 09/14/18 1242 09/15/18 0624 09/17/18 1009 09/18/18 0023  NA 140   < > 134* 131* 133* 131* 130*  K 2.8*   < > 2.6* 3.3* 3.1* 3.8 4.0  CL 90*   < > 75* 74* 81* 89* 93*  CO2 38*   < > 42* 39* 36* 29 27  GLUCOSE 196*   < > 178* 281* 184* 272* 181*  BUN 22   < > 29* 28* 29* 31* 43*  CREATININE 1.41*   < > 1.61* 1.72* 1.68* 1.87* 3.14*  CALCIUM 8.6*   < >  9.6 9.9 9.5 9.1 8.3*  MG 2.1  --   --   --   --   --   --    < > = values in this interval not displayed.   GFR: Estimated Creatinine Clearance: 19.2 mL/min (A) (by C-G formula based on SCr of 3.14 mg/dL (H)). Liver Function Tests: Recent Labs  Lab 09/14/18 0538 09/18/18 0023  AST 40 23  ALT 32 19  ALKPHOS 104 89  BILITOT 1.4* 0.8  PROT 6.9 5.1*  ALBUMIN 2.9* 2.5*   No results for input(s): LIPASE, AMYLASE in the last 168 hours. No results for input(s): AMMONIA in the last 168 hours. Coagulation Profile: No results for input(s): INR, PROTIME in the last 168 hours. Cardiac Enzymes: No results for input(s): CKTOTAL, CKMB, CKMBINDEX, TROPONINI in the last 168 hours. BNP (last 3 results) No results for input(s): PROBNP in the last 8760 hours. HbA1C: No results for input(s): HGBA1C in the last 72 hours. CBG: Recent Labs  Lab 09/16/18 2118 09/17/18 0608 09/17/18 1140 09/17/18 1652 09/17/18 2050  GLUCAP 193* 258* 219* 180* 189*   Lipid Profile: No results for input(s): CHOL, HDL, LDLCALC, TRIG, CHOLHDL, LDLDIRECT in the last 72 hours. Thyroid Function Tests: No results for input(s): TSH, T4TOTAL, FREET4, T3FREE,  THYROIDAB in the last 72 hours. Anemia Panel: No results for input(s): VITAMINB12, FOLATE, FERRITIN, TIBC, IRON, RETICCTPCT in the last 72 hours. Urine analysis:    Component Value Date/Time   COLORURINE YELLOW 09/09/2018 [place] 09/09/2018 1542   LABSPEC 1.008 09/09/2018 1542   PHURINE 5.0 09/09/2018 1542   GLUCOSEU NEGATIVE 09/09/2018 1542   HGBUR SMALL (A) 09/09/2018 1542   BILIRUBINUR NEGATIVE 09/09/2018 1542   KETONESUR NEGATIVE 09/09/2018 1542   PROTEINUR 100 (A) 09/09/2018 1542   UROBILINOGEN 1.0 04/16/2011 0954   NITRITE NEGATIVE 09/09/2018 1542   LEUKOCYTESUR NEGATIVE 09/09/2018 1542    Radiological Exams on Admission: Dg Chest Port 1 View  Result Date: 09/18/2018 CLINICAL DATA:  Initial evaluation for hypotension, shortness of breath. EXAM: PORTABLE CHEST 1 VIEW COMPARISON:  Prior radiograph from 09/10/2018. FINDINGS: Median sternotomy wires with underlying surgical clips and CABG markers noted. Moderate cardiomegaly, stable. Mediastinal silhouette within normal limits. Lungs normally inflated. No focal infiltrates. No pulmonary edema or discernible pleural effusion. No pneumothorax. No acute osseous finding. IMPRESSION: 1. No radiographic evidence for active cardiopulmonary disease. 2. Stable cardiomegaly without pulmonary edema. Electronically Signed   By: Jeannine Boga M.D.   On: 09/18/2018 02:11    Assessment/Plan Principal Problem:   Hypotension Active Problems:   CKD (chronic kidney disease), stage III (HCC)   Bradycardia   AKI (acute kidney injury) ([place])   Profound hypotension and bradycardia -Patient has been transferred from rehab to 4 E. progressive care unit. -Systolic in the 36R and diastolic in the 44R.  Heart rate ranging from 30s to 40s.  AAO x3 -Suspect hypotension is related to overdiuresis and antihypertensive use. -Work-up for infectious etiology-patient is afebrile.  White count only mildly elevated at 13.5.  Lactic acid  normal.  Chest x-ray pending.  UA and urine culture pending.  Blood culture x2 pending.  Check procalcitonin level. -Adrenal insufficiency less likely to be causing hypotension as cortisol level within normal range. -Acute blood loss less likely be causing hypotension as no significant drop in hemoglobin. -PE less likely to be causing hypotension as patient is not hypoxic and is currently on anticoagulation for A. Fib. -Cardiogenic shock less likely given improvement in  EF from prior echo. Repeat echocardiogram revealed improvement of her EF from 25 to 30% previously to 45 to 50% at present. -Bradycardia likely related to beta-blocker use, last dose was yesterday morning.  Prolonged drug effect likely due to patient's AKI.  Discussed with cardiology and her bradycardia is not thought to be contributing to her hypotension.  She is inappropriately bradycardic despite her blood pressure being this low.  Cardiology recommended starting dopamine infusion which has been ordered. Will also check Stat EKG to check for heart block. Check TSH, free T4. -Patient received fluid boluses since yesterday afternoon and continues to be hypotensive.  Most recent blood pressure 76/40.  Additional 1 L fluid bolus has been ordered. -All antihypertensives and diuretics have already been stopped.  Other medications that could possibly cause hypotension including Xanax, oxycodone, and Ambien have also been stopped. -PCCM was consulted and patient is being transferred to the ICU.  AKI on CKD stage III Suspect prerenal secondary to overdiuresis during recent hospitalization and hypotension. BUN 43, creatinine 3.1.  Creatinine was 1.8 a day ago. -Continue IV fluid resuscitation -Monitor urine output -Renal ultrasound  The medical decision making on this patient was of high complexity and the patient is at high risk for clinical deterioration, therefore this is a level 3 visit.  Shela Leff MD [facility] Pager  [phone]  If 7PM-7AM, please contact night-coverage [email or web address] Password [email or web address]  09/18/2018, 3:05 AM

## 2018-09-18 NOTE — Procedures (Signed)
Central Venous Catheter Insertion Procedure Note [name] [name] [id number] 1945-07-06  Procedure: Insertion of Central Venous Catheter Indications: Drug and/or fluid administration  Procedure Details Consent: Risks of procedure as well as the alternatives and risks of each were explained to the (patient/caregiver).  Consent for procedure obtained. Time Out: Verified patient identification, verified procedure, site/side was marked, verified correct patient position, special equipment/implants available, medications/allergies/relevent history reviewed, required imaging and test results available.  Performed  Maximum sterile technique was used including antiseptics, cap, gloves, gown, hand hygiene, mask and sheet. Skin prep: Chlorhexidine; local anesthetic administered A antimicrobial bonded/coated triple lumen catheter was placed in the left internal jugular vein using the Seldinger technique.  Ultrasound guided imaging used in real time, vessel cannulation visualized  Evaluation Blood flow good Complications: No apparent complications Patient did tolerate procedure well. Chest X-ray ordered to verify placement.  CXR: pending.  Otilio Carpen Korin Setzler 09/18/2018, 5:32 PM

## 2018-09-18 NOTE — Discharge Instructions (Signed)

## 2018-09-18 NOTE — Progress Notes (Signed)
Progress Note  Patient Name: [name] [name] Date of Encounter: 09/18/2018  Primary Cardiologist: Larae Grooms, MD  Subjective   She feels slightly less dizzy, denies SOB or CP.  Inpatient Medications    Scheduled Meds:  albuterol  2.5 mg Nebulization Q4H   apixaban  5 mg Oral BID   budesonide (PULMICORT) nebulizer solution  0.25 mg Nebulization BID   buPROPion  150 mg Oral Daily   Chlorhexidine Gluconate Cloth  6 each Topical Q0600   Chlorhexidine Gluconate Cloth  6 each Topical Daily   clopidogrel  75 mg Oral Daily   dextromethorphan-guaiFENesin  1 tablet Oral BID   DULoxetine  30 mg Oral Daily   insulin aspart  0-15 Units Subcutaneous TID WC   mouth rinse  15 mL Mouth Rinse BID   pantoprazole  40 mg Oral Daily   rosuvastatin  40 mg Oral q1800   Continuous Infusions:  sodium chloride 10 mL/hr at 09/18/18 0700   DOPamine 12 mcg/kg/min (09/18/18 0700)   PRN Meds: sodium chloride, ALPRAZolam, ondansetron (ZOFRAN) IV   Vital Signs    Vitals:   09/18/18 0815 09/18/18 0830 09/18/18 0845 09/18/18 0851  BP: (!) 111/51  (!) 134/112 (!) 104/52  Pulse: (!) 51 (!) 50 (!) 50 (!) 49  Resp: 18 17 19 19   Temp:      TempSrc:      SpO2: 100% 99% 98% 99%  Weight:        Intake/Output Summary (Last 24 hours) at 09/18/2018 0953 Last data filed at 09/18/2018 0700 Gross per 24 hour  Intake 609.24 ml  Output --  Net 609.24 ml   Last 3 Weights 09/18/2018 09/17/2018 09/16/2018  Weight (lbs) 238 lb 1.6 oz 231 lb 14.8 oz 234 lb 9.1 oz  Weight (kg) 108 kg 105.2 kg 106.4 kg     Telemetry    SB - Personally Reviewed  Physical Exam   GEN: No acute distress.   Neck: No JVD Cardiac: RRR, no murmurs, rubs, or gallops.  Respiratory: minimal rales at basis bilaterally. GI: Soft, nontender, non-distended  MS: No edema; No deformity. Neuro:  Nonfocal  Psych: Normal affect   Labs    High Sensitivity Troponin:  No results for input(s): TROPONINIHS in the last 720  hours.    Cardiac EnzymesNo results for input(s): TROPONINI in the last 168 hours. No results for input(s): TROPIPOC in the last 168 hours.   Chemistry Recent Labs  Lab 09/14/18 0538  09/15/18 0624 09/17/18 1009 09/18/18 0023  NA 134*   < > 133* 131* 130*  K 2.6*   < > 3.1* 3.8 4.0  CL 75*   < > 81* 89* 93*  CO2 42*   < > 36* 29 27  GLUCOSE 178*   < > 184* 272* 181*  BUN 29*   < > 29* 31* 43*  CREATININE 1.61*   < > 1.68* 1.87* 3.14*  CALCIUM 9.6   < > 9.5 9.1 8.3*  PROT 6.9  --   --   --  5.1*  ALBUMIN 2.9*  --   --   --  2.5*  AST 40  --   --   --  23  ALT 32  --   --   --  19  ALKPHOS 104  --   --   --  89  BILITOT 1.4*  --   --   --  0.8  GFRNONAA 32*   < > 30* 27* 14*  GFRAA 37*   < > 35* 31* 17*  ANIONGAP 17*   < > 16* 13 10   < > = values in this interval not displayed.     Hematology Recent Labs  Lab 09/13/18 0317 09/14/18 0538 09/18/18 0023  WBC 10.9* 11.3* 13.5*  RBC 3.81* 4.39 3.69*  HGB 10.9* 12.3 10.4*  HCT 33.9* 38.6 32.5*  MCV 89.0 87.9 88.1  MCH 28.6 28.0 28.2  MCHC 32.2 31.9 32.0  RDW 14.7 14.9 15.5  PLT 191 196 204   BNP Recent Labs  Lab 09/18/18 0627  BNP 226.7*    DDimer No results for input(s): DDIMER in the last 168 hours.   Radiology    Ct Head Wo Contrast  Result Date: 09/18/2018 CLINICAL DATA:  Focal neural deficit of less than 6 hours, suspected stroke, slurred speech, headache EXAM: CT HEAD WITHOUT CONTRAST TECHNIQUE: Contiguous axial images were obtained from the base of the skull through the vertex without intravenous contrast. Sagittal and coronal MPR images reconstructed from axial data set. COMPARISON:  09/09/2018 FINDINGS: Brain: Generalized atrophy. Normal ventricular morphology. No midline shift or mass effect. Small vessel chronic ischemic changes of deep cerebral white matter. Old RIGHT occipital infarct. Old white matter infarcts in RIGHT centrum semiovale. No intracranial hemorrhage, mass lesion, or evidence of acute  infarction. No extra-axial fluid collections. No intracranial hemorrhage, mass lesion, evidence of acute infarction, or extra-axial fluid collection. Vascular: Atherosclerotic calcifications of internal carotid arteries at skull base Skull: Intact.  Mild hyperostosis frontalis interna Sinuses/Orbits: Clear Other: N/A IMPRESSION: Atrophy with small vessel chronic ischemic changes of deep cerebral white matter. Old RIGHT occipital and RIGHT centrum semiovale infarcts. No acute intracranial abnormalities. Electronically Signed   By: Lavonia Dana M.D.   On: 09/18/2018 08:22   US Renal  Result Date: 09/18/2018 CLINICAL DATA:  Initial evaluation for acute renal injury. EXAM: RENAL / URINARY TRACT ULTRASOUND COMPLETE COMPARISON:  None available. FINDINGS: Right Kidney: Renal measurements: 10.6 x 5.1 x 6.3 cm = volume: 178.1 mL . Echogenicity within normal limits. No mass or hydronephrosis visualized. Left Kidney: Renal measurements: 9.5 x 4.8 x 4.3 cm = volume: 100.0 mL. Echogenicity within normal limits. No mass or hydronephrosis visualized. Bladder: Not visualized on this exam. IMPRESSION: Normal renal ultrasound. No hydronephrosis or other significant finding. Electronically Signed   By: Jeannine Boga M.D.   On: 09/18/2018 03:38   Dg Chest Port 1 View  Result Date: 09/18/2018 CLINICAL DATA:  Initial evaluation for hypotension, shortness of breath. EXAM: PORTABLE CHEST 1 VIEW COMPARISON:  Prior radiograph from 09/10/2018. FINDINGS: Median sternotomy wires with underlying surgical clips and CABG markers noted. Moderate cardiomegaly, stable. Mediastinal silhouette within normal limits. Lungs normally inflated. No focal infiltrates. No pulmonary edema or discernible pleural effusion. No pneumothorax. No acute osseous finding. IMPRESSION: 1. No radiographic evidence for active cardiopulmonary disease. 2. Stable cardiomegaly without pulmonary edema. Electronically Signed   By: Jeannine Boga M.D.   On:  09/18/2018 02:11   Cardiac Studies   09/17/2018  1. The left ventricle has mildly reduced systolic function, with an ejection fraction of 45-50%. The cavity size was normal. Moderate basal septal hypertrophy. Otherwise, mild concentric LVH of remaining myocardium. Diastolic dysfunction, grade  indeterminate. Elevated left ventricular end-diastolic pressure There is septal dysynergy.  2. The right ventricle has moderately reduced systolic function. The cavity was moderately enlarged. There is not assessed.  3. The aortic valve is tricuspid. Mild thickening of the aortic valve. Mild calcification of  the aortic valve. Aortic valve regurgitation is trivial by color flow Doppler. Mild to moderate aortic annular calcification noted.  4. The mitral valve is degenerative. Mild thickening of the mitral valve leaflet. Mild calcification of the mitral valve leaflet. There is severe mitral annular calcification present.  5. The tricuspid valve is grossly normal. Tricuspid valve regurgitation is mild-moderate.  6. The aorta is normal unless otherwise noted.  7. The inferior vena cava was dilated in size with >50% respiratory variability.  8. The interatrial septum was not well visualized.    Patient Profile     73 y.o. female a hx of diabetes mellitus, hypertension, hyperlipidemia, prior CVA, obstructive sleep apnea, coronary artery disease status post coronary artery bypass and graft, chronic combined systolic/diastolic congestive heart failurewho is being seen today for the evaluation of orthostatic hypotension and chronic combined systolic/diastolic congestive heart failure  Assessment & Plan    1. Orthostatic hypotension-patient describes dizziness with standing which is chronic since lastOctoberbut worse recently. BP improved, still on low dose of Dopamine 8 mcg/kg/min. She underwent diuresis during recent admission;demadex and spironolactone were continued and daily metolazone added. All  diuretics held this admission, crea up from baseline 1.2-1.4 -> 1.6 on admission to 3.1 today, this is possibly sec to cardiorenal syndrome and severe hypotension. I would  continue holding Demadex and spironolactone and metolazone, and reevaluate in the am, her weight is up but she is not fluid overloaded on physical exam. 2. History of chronic combined systolic/diastolic congestive heart failure-patient apparently was volume overloaded prior to recent admission for pneumonia and CHF. She is improved following diuresis. Plan for diuretics as outlined above. Her LV function was noted to be reduced on last echocardiogram but was felt potentially to be stress cardiomyopathy. We will repeat echocardiogram to see if LV function has improved. If LV function does not improve would likely need cardiac catheterization in the future.   Continue Toprol and resume low dose losartan.  Decrease amlodipine to 5 mg daily. 3. Coronary artery diseases/p CABG-continue Plavixand statin. 4. Hyperlipidemia-continue praulent, Zetia and Crestor. 5. Paroxysmal atrial fibrillation-patient remains in sinus rhythm on most recent ECG. Amiodarone was discontinued previously because of potential lung toxicity. Beta-blocker was held in the settings of hypotension and bradycardia. Continue apixaban. 6. Chronic stage III kidney disease-renal function worse compared to creatinine at time of recent admission. Will hold diuretics briefly as outlined above. Recheck bmet.  For questions or updates, please contact [place] Please consult [email or web address] for contact info under     Signed, Ena Dawley, MD  09/18/2018, 9:53 AM

## 2018-09-18 NOTE — Progress Notes (Signed)
On Assessment I found patient with garbled speech she was answering questions with jumbled words and some speech that was not any words . She was able to follow commands and her NIHSS was  6 . Code stroke was called .

## 2018-09-18 NOTE — Discharge Summary (Deleted)
  The note originally documented on this encounter has been moved the the encounter in which it belongs.  

## 2018-09-18 NOTE — Significant Event (Signed)
Rapid Response Event Note  Overview:  Responded to code stroke called at 0743 for slurred speech. RN on phone with Dr Lorraine Lax who stated to cancel code stroke and bring pt to STAT head CT.   On arrival, pt A&Ox4, able to MAE, good strength throughout. Assisted with transport to CT and back to room without any issues.   Sherilyn Dacosta

## 2018-09-18 NOTE — Progress Notes (Addendum)
NEUROLOGY PROGRESS NOTE  Subjective: On initial encounter patient is very drowsy however once woken up patient states that she feels her speech is back to normal.  Speech is clear.  Patient has no other complaints  Exam: Vitals:   09/18/18 0845 09/18/18 0851  BP: (!) 134/112 (!) 104/52  Pulse: (!) 50 (!) 49  Resp: 19 19  Temp:    SpO2: 98% 99%    Physical Exam   HEENT-  Normocephalic, no lesions, without obvious abnormality.  Normal external eye and conjunctiva.   Extremities- Warm, dry and intact Musculoskeletal-no joint tenderness, deformity or swelling Skin-warm and dry, no hyperpigmentation, vitiligo, or suspicious lesions    Neuro:  Mental Status: Alert to hospital, and the fact that her speech was slurred, able to follow commands, Speech fluent without evidence of aphasia.  Able to follow 3 step commands without difficulty. Cranial Nerves: II:  Visual fields grossly normal,  III,IV, VI: ptosis not present, extra-ocular motions intact bilaterally pupils equal, round, reactive to light and accommodation V,VII: smile symmetric, facial light touch sensation normal bilaterally VIII: hearing normal bilaterally IX,X: Palate rises midline XI: bilateral shoulder shrug XII: midline tongue extension Motor: Right : Upper extremity   5/5    Left:     Upper extremity   5/5  Lower extremity   5/5     Lower extremity   5/5 Tone and bulk:normal tone throughout; no atrophy noted Sensory: Pinprick and light touch intact throughout, bilaterally     Medications:  Scheduled: . albuterol  2.5 mg Nebulization Q4H  . apixaban  5 mg Oral BID  . budesonide (PULMICORT) nebulizer solution  0.25 mg Nebulization BID  . buPROPion  150 mg Oral Daily  . Chlorhexidine Gluconate Cloth  6 each Topical Q0600  . Chlorhexidine Gluconate Cloth  6 each Topical Daily  . clopidogrel  75 mg Oral Daily  . dextromethorphan-guaiFENesin  1 tablet Oral BID  . DULoxetine  30 mg Oral Daily  . insulin aspart   0-15 Units Subcutaneous TID WC  . mouth rinse  15 mL Mouth Rinse BID  . pantoprazole  40 mg Oral Daily  . rosuvastatin  40 mg Oral q1800    Pertinent Labs/Diagnostics: Creatinine bumped up to 3.14 CBC increasing from 10.9 up to 13.5   Ct Head Wo Contrast  Result Date: 09/18/2018 CLINICAL DATA:  Focal neural deficit of less than 6 hours, suspected stroke, slurred speech, headache EXAM: CT HEAD WITHOUT CONTRAST TECHNIQUE: Contiguous axial images were obtained from the base of the skull through the vertex without intravenous contrast. Sagittal and coronal MPR images reconstructed from axial data set. COMPARISON:  09/09/2018 FINDINGS: Brain: Generalized atrophy. Normal ventricular morphology. No midline shift or mass effect. Small vessel chronic ischemic changes of deep cerebral white matter. Old RIGHT occipital infarct. Old white matter infarcts in RIGHT centrum semiovale. No intracranial hemorrhage, mass lesion, or evidence of acute infarction. No extra-axial fluid collections. No intracranial hemorrhage, mass lesion, evidence of acute infarction, or extra-axial fluid collection. Vascular: Atherosclerotic calcifications of internal carotid arteries at skull base Skull: Intact.  Mild hyperostosis frontalis interna Sinuses/Orbits: Clear Other: N/A IMPRESSION: Atrophy with small vessel chronic ischemic changes of deep cerebral white matter. Old RIGHT occipital and RIGHT centrum semiovale infarcts. No acute intracranial abnormalities. Electronically Signed   By: Lavonia Dana M.D.   On: 09/18/2018 08:22      Etta Quill PA-C [facility] [phone]   ASSESSMENT AND PLAN  73 year old female with past medical history for  stroke, atrial fibrillation on Eliquis, CHF, CKD, hypertension, pneumonia with hypotension and bradycardia.  Patient was started on dopamine for blood pressure.  Patient was noted to have slurred and slow speech this morning and code stroke was activated.  Symptoms were  transient and resolved.  Patient was also noted to be hypotensive and bradycardic.  Stat CT head was obtained which was negative for hemorrhage.  Acute encephalopathy Transient slurred speech  Recommendations -Continue Eliquis -No further neurological work-up

## 2018-09-18 NOTE — Code Documentation (Signed)
Stroke RN responded to code stroke activation. RN and RRT RN at bedside. Patient with slurred speech. Patient's last dose of Eliquis was 09/17/2018 at 2229. Patient is not a candidate for treatment with tPA. Cancel code stroke per Dr. Lorraine Lax. RRT RN assisting bedside RN with transport for STAT CT.

## 2018-09-18 NOTE — Progress Notes (Signed)
Inpatient Diabetes Program Recommendations  AACE/ADA: New Consensus Statement on Inpatient Glycemic Control   Target Ranges:  Prepandial:   less than 140 mg/dL      Peak postprandial:   less than 180 mg/dL (1-2 hours)      Critically ill patients:  140 - 180 mg/dL   Results for [name] [name], [name] [name] ([name] [id number]) as of 09/18/2018 13:17  Ref. Range 09/17/2018 06:08 09/17/2018 11:40 09/17/2018 16:52 09/17/2018 20:50 09/18/2018 03:41 09/18/2018 07:38 09/18/2018 11:08  Glucose-Capillary Latest Ref Range: 70 - 99 mg/dL 258 (H)  Novolog 9 units 219 (H)  Novolog 7 units 180 (H)  Novolog 6 units 189 (H)   Lantus 32 units 135 (H) 181 (H)  Novolog 3 units 204 (H)  Novolog 5 units   Review of Glycemic Control  Diabetes history: DM2 Outpatient Diabetes medications: Lantus 38 units QHS, Novolog 20 units TID with meals Current orders for Inpatient glycemic control: Novolog 0-15 units TID with meals  Inpatient Diabetes Program Recommendations:   Insulin-Basal: Noted Lantus 32 units given at 22:27 on 09/17/18 and fasting glucose 181 mg/dl today and no current orders for Lantus. Please consider ordering Lantus 34 units QHS.  Insulin-Meal: Patient was ordered Novolog 4 units TID with meals for meal coverage but no longer ordered.  Please consider ordering Novolog 5 units TID with meals for meal coverage if patient eats at least 50% of meals.  Thanks, Barnie Alderman, RN, MSN, CDE Diabetes Coordinator Inpatient Diabetes Program [phone] (Team Pager from 8am to 5pm)

## 2018-09-18 NOTE — Progress Notes (Signed)
Patient awake, eyes open responds to commands appropriately and briskly.  Husband at bedside, patient asking for dinner.  GCS 15.

## 2018-09-18 NOTE — Progress Notes (Signed)
Inpatient Rehabilitation Admissions Coordinator  Patient admitted to acute hospital from CIR/inpt rehab on 8/16. Originally admitted on 8/12 to CIR. I will follow her progress on acute to hopefully readmit to inpt rehab when/if appropriate.  Danne Baxter, RN, MSN Rehab Admissions Coordinator [phone] 09/18/2018 1:33 PM

## 2018-09-18 NOTE — Discharge Summary (Signed)
Physician Discharge Summary  Patient ID: [name] [name] [name]: [id number] DOB/AGE: 09-05-1945 73 y.o.  Admit date: 09/18/2018 Discharge date: 09/18/2018  Discharge Diagnoses:  Principal Problem:   Hypotension Active Problems:   CKD (chronic kidney disease), stage III (HCC)   Bradycardia   AKI (acute kidney injury) ([place]) DVT prophylaxis Hypotension Diabetes mellitus MRSA PCR screening positive CAD with CABG Hyperlipidemia Psoriasis versus eczema  Discharged Condition: Guarded  Significant Diagnostic Studies: Dg Ribs Unilateral Left  Result Date: 09/09/2018 CLINICAL DATA:  Recent falls.  Left leg and rib pain. EXAM: LEFT RIBS - 2 VIEW COMPARISON:  Chest radiographs today.  Chest CT 04/03/2018. FINDINGS: Two views demonstrate no evidence of acute left-sided rib fracture or focal rib lesion. As noted on earlier radiographs, there is probable pulmonary edema with stable cardiomegaly. No significant pleural effusion or pneumothorax. There are postsurgical changes in the left upper quadrant of the abdomen. IMPRESSION: No evidence of left-sided rib fracture, pleural effusion or pneumothorax. Electronically Signed   By: Richardean Sale M.D.   On: 09/09/2018 18:20   Ct Head Wo Contrast  Result Date: 09/09/2018 CLINICAL DATA:  Head trauma. High clinical risk. Slurred speech. EXAM: CT HEAD WITHOUT CONTRAST TECHNIQUE: Contiguous axial images were obtained from the base of the skull through the vertex without intravenous contrast. COMPARISON:  March 29, 2018 FINDINGS: Brain: No evidence of acute hemorrhage, hydrocephalus, extra-axial collection or mass lesion/mass effect. Chronic right occipital lobe infarct. New areas of hypoattenuation in the subcortical white matter of the right frontal lobe may represent small age-indeterminate ischemic infarcts. Vascular: Calcific atherosclerotic disease of the intra cavernous carotid arteries. Skull: Normal. Negative for fracture or focal lesion. Sinuses/Orbits:  Minimal opacification of the left mastoid air cells. Paranasal sinuses are clear. Other: None. IMPRESSION: 1. New areas of hypoattenuation in the subcortical white matter of the right frontal lobe may represent small age-indeterminate ischemic infarcts. 2. Chronic right occipital lobe infarct. 3. No evidence of traumatic injury to the brain. Electronically Signed   By: Fidela Salisbury M.D.   On: 09/09/2018 17:29   Mr Angio Head Wo Contrast  Result Date: 09/10/2018 CLINICAL DATA:  Weakness and dizziness for the past several days. Mild slurred speech developed today. EXAM: MRI HEAD WITHOUT CONTRAST MRA HEAD WITHOUT CONTRAST TECHNIQUE: Multiplanar, multiecho pulse sequences of the brain and surrounding structures were obtained without intravenous contrast. Angiographic images of the head were obtained using MRA technique without contrast. COMPARISON:  Brain MRI 10/07/2013 and MRA head 06/01/2014 FINDINGS: MRI HEAD FINDINGS BRAIN: There is no acute infarct, acute hemorrhage or extra-axial collection. There is an old right PCA territory infarct. Multiple areas of rounded hyperintense T2-weighted signal are present within the white matter of both hemispheres. The cerebral and cerebellar volume are age-appropriate. There is no hydrocephalus. The midline structures are normal. VASCULAR: There are 3-5 scattered foci of chronic microhemorrhage. The major intracranial arterial and venous sinus flow voids are normal. SKULL AND UPPER CERVICAL SPINE: Calvarial bone marrow signal is normal. There is no skull base mass. The visualized upper cervical spine and soft tissues are normal. SINUSES/ORBITS: There are no fluid levels or advanced mucosal thickening. The mastoid air cells and middle ear cavities are free of fluid. The orbits are normal. MRA HEAD FINDINGS POSTERIOR CIRCULATION: --Vertebral arteries: Normal V4 segments. --Posterior inferior cerebellar arteries (PICA): Patent origins from the vertebral arteries.  --Anterior inferior cerebellar arteries (AICA): Not clearly visualized, though this is not uncommon. --Basilar artery: Proximal basilar artery is severely narrowed, but remains patent. --  Superior cerebellar arteries: Normal. --Posterior cerebral arteries (PCA): The right PCA arises from the basilar artery with a diminutive right posterior communicating artery (P-comm). There is no right PCA occlusion or proximal stenosis. The left PCA originates combination from the basilar artery and AE small left P-comm. There is severe stenosis of the P1 and P2 segments. ANTERIOR CIRCULATION: --Intracranial internal carotid arteries: There is multifocal severe stenosis of both internal carotid arteries at the skull base. --Anterior cerebral arteries (ACA): Limited enhancement of the left A1 segment. Otherwise normal. --Middle cerebral arteries (MCA): There is mild narrowing within some of the distal right MCA M2 branches, but the right MCA is otherwise normal. There is loss of flow related enhancement at the left M1 segment proximally. The distal M1 segment enhances normally. The left M2 branches are patent. IMPRESSION: 1. No acute ischemic infarct or intracranial hemorrhage. 2. Multiple rounded areas of peripheral hyperintense T2-weighted signal within the bilateral supratentorial white matter, corresponding to the areas of hypoattenuation on the earlier head CT. These likely indicate late subacute to early chronic infarcts, not present on prior MRIs. No diffusion restriction to indicate acute ischemia. 3. Severe intracranial atherosclerosis with short segment occlusion of the proximal left middle cerebral artery with normal enhancement of the distal left M1 segment and the M2 branches, new compared to 06/01/2014. 4. Severe stenosis of the left PCA P1 and P2 segments, unchanged compared to 06/01/2014. 5. Severe stenoses of both internal carotid arteries at the skull base, progressed compared to 06/01/2014. Electronically Signed    By: Ulyses Jarred M.D.   On: 09/10/2018 02:19   Mr Brain Wo Contrast  Result Date: 09/10/2018 CLINICAL DATA:  Weakness and dizziness for the past several days. Mild slurred speech developed today. EXAM: MRI HEAD WITHOUT CONTRAST MRA HEAD WITHOUT CONTRAST TECHNIQUE: Multiplanar, multiecho pulse sequences of the brain and surrounding structures were obtained without intravenous contrast. Angiographic images of the head were obtained using MRA technique without contrast. COMPARISON:  Brain MRI 10/07/2013 and MRA head 06/01/2014 FINDINGS: MRI HEAD FINDINGS BRAIN: There is no acute infarct, acute hemorrhage or extra-axial collection. There is an old right PCA territory infarct. Multiple areas of rounded hyperintense T2-weighted signal are present within the white matter of both hemispheres. The cerebral and cerebellar volume are age-appropriate. There is no hydrocephalus. The midline structures are normal. VASCULAR: There are 3-5 scattered foci of chronic microhemorrhage. The major intracranial arterial and venous sinus flow voids are normal. SKULL AND UPPER CERVICAL SPINE: Calvarial bone marrow signal is normal. There is no skull base mass. The visualized upper cervical spine and soft tissues are normal. SINUSES/ORBITS: There are no fluid levels or advanced mucosal thickening. The mastoid air cells and middle ear cavities are free of fluid. The orbits are normal. MRA HEAD FINDINGS POSTERIOR CIRCULATION: --Vertebral arteries: Normal V4 segments. --Posterior inferior cerebellar arteries (PICA): Patent origins from the vertebral arteries. --Anterior inferior cerebellar arteries (AICA): Not clearly visualized, though this is not uncommon. --Basilar artery: Proximal basilar artery is severely narrowed, but remains patent. --Superior cerebellar arteries: Normal. --Posterior cerebral arteries (PCA): The right PCA arises from the basilar artery with a diminutive right posterior communicating artery (P-comm). There is no  right PCA occlusion or proximal stenosis. The left PCA originates combination from the basilar artery and AE small left P-comm. There is severe stenosis of the P1 and P2 segments. ANTERIOR CIRCULATION: --Intracranial internal carotid arteries: There is multifocal severe stenosis of both internal carotid arteries at the skull base. --Anterior cerebral arteries (ACA):  Limited enhancement of the left A1 segment. Otherwise normal. --Middle cerebral arteries (MCA): There is mild narrowing within some of the distal right MCA M2 branches, but the right MCA is otherwise normal. There is loss of flow related enhancement at the left M1 segment proximally. The distal M1 segment enhances normally. The left M2 branches are patent. IMPRESSION: 1. No acute ischemic infarct or intracranial hemorrhage. 2. Multiple rounded areas of peripheral hyperintense T2-weighted signal within the bilateral supratentorial white matter, corresponding to the areas of hypoattenuation on the earlier head CT. These likely indicate late subacute to early chronic infarcts, not present on prior MRIs. No diffusion restriction to indicate acute ischemia. 3. Severe intracranial atherosclerosis with short segment occlusion of the proximal left middle cerebral artery with normal enhancement of the distal left M1 segment and the M2 branches, new compared to 06/01/2014. 4. Severe stenosis of the left PCA P1 and P2 segments, unchanged compared to 06/01/2014. 5. Severe stenoses of both internal carotid arteries at the skull base, progressed compared to 06/01/2014. Electronically Signed   By: Ulyses Jarred M.D.   On: 09/10/2018 02:19   US Renal  Result Date: 09/18/2018 CLINICAL DATA:  Initial evaluation for acute renal injury. EXAM: RENAL / URINARY TRACT ULTRASOUND COMPLETE COMPARISON:  None available. FINDINGS: Right Kidney: Renal measurements: 10.6 x 5.1 x 6.3 cm = volume: 178.1 mL . Echogenicity within normal limits. No mass or hydronephrosis visualized.  Left Kidney: Renal measurements: 9.5 x 4.8 x 4.3 cm = volume: 100.0 mL. Echogenicity within normal limits. No mass or hydronephrosis visualized. Bladder: Not visualized on this exam. IMPRESSION: Normal renal ultrasound. No hydronephrosis or other significant finding. Electronically Signed   By: Jeannine Boga M.D.   On: 09/18/2018 03:38   Dg Chest Port 1 View  Result Date: 09/18/2018 CLINICAL DATA:  Initial evaluation for hypotension, shortness of breath. EXAM: PORTABLE CHEST 1 VIEW COMPARISON:  Prior radiograph from 09/10/2018. FINDINGS: Median sternotomy wires with underlying surgical clips and CABG markers noted. Moderate cardiomegaly, stable. Mediastinal silhouette within normal limits. Lungs normally inflated. No focal infiltrates. No pulmonary edema or discernible pleural effusion. No pneumothorax. No acute osseous finding. IMPRESSION: 1. No radiographic evidence for active cardiopulmonary disease. 2. Stable cardiomegaly without pulmonary edema. Electronically Signed   By: Jeannine Boga M.D.   On: 09/18/2018 02:11   Dg Chest Port 1 View  Result Date: 09/10/2018 CLINICAL DATA:  73 year old female with history of dyspnea. EXAM: PORTABLE CHEST 1 VIEW COMPARISON:  Chest x-ray 09/09/2018. FINDINGS: There is cephalization of the pulmonary vasculature and slight indistinctness of the interstitial markings suggestive of mild pulmonary edema. More focal consolidative airspace disease in the right lung base. No definite pleural effusions. Mild cardiomegaly. Upper mediastinal contours are within normal limits. Aortic atherosclerosis. Status post median sternotomy for CABG. IMPRESSION: 1. The appearance the chest suggests congestive heart failure, as above. In addition, there is more focal airspace consolidation in the right lung base which could suggest superimposed developing bronchopneumonia. Further clinical evaluation is suggested. 2. Aortic atherosclerosis. Electronically Signed   By: Vinnie Langton M.D.   On: 09/10/2018 10:31   Dg Chest Port 1 View  Result Date: 09/09/2018 CLINICAL DATA:  Shortness of breath multiple recent falls and slurred speech. History of diabetes and stroke. EXAM: PORTABLE CHEST 1 VIEW COMPARISON:  Radiographs 05/18/2018 and 04/05/2018.  CT 04/03/2018. FINDINGS: 1549 hours. Stable cardiomegaly post median sternotomy and CABG. Pulmonary vascular congestion has slightly worsened, and there is probable mild pulmonary edema. No confluent  airspace opacity, pleural effusion or pneumothorax. The bones appear intact. Telemetry leads overlie the chest. IMPRESSION: Cardiomegaly with increased vascular congestion and probable mild pulmonary edema. No confluent airspace opacity or pneumothorax. Electronically Signed   By: Richardean Sale M.D.   On: 09/09/2018 16:31   Dg Femur Min 2 Views Left  Result Date: 09/09/2018 CLINICAL DATA:  Recent falls.  Left thigh pain. EXAM: LEFT FEMUR 2 VIEWS COMPARISON:  Left hip radiographs 07/31/2018. FINDINGS: The mineralization and alignment are normal. There is no evidence of acute fracture or dislocation. Stable mild degenerative changes at the left hip and knee. There are scattered vascular calcifications and vascular clips. No unexpected foreign bodies. IMPRESSION: No evidence of acute fracture or dislocation. Electronically Signed   By: Richardean Sale M.D.   On: 09/09/2018 18:22   Vas US Carotid  Result Date: 09/11/2018 Carotid Arterial Duplex Study Indications:       CVA. Risk Factors:      Hypertension, hyperlipidemia, Diabetes, coronary artery                    disease, prior CVA. Comparison Study:  05/23/17 Performing Technologist: Abram Sander RVS  Examination Guidelines: A complete evaluation includes B-mode imaging, spectral Doppler, color Doppler, and power Doppler as needed of all accessible portions of each vessel. Bilateral testing is considered an integral part of a complete examination. Limited examinations for reoccurring  indications may be performed as noted.  Right Carotid Findings: +----------+--------+--------+--------+------------+---------+           PSV cm/sEDV cm/sStenosisDescribe    Comments  +----------+--------+--------+--------+------------+---------+ CCA Prox  34      8               heterogenous          +----------+--------+--------+--------+------------+---------+ CCA Distal51      11              heterogenous          +----------+--------+--------+--------+------------+---------+ ICA Prox  66      25      1-39%   heterogenousShadowing +----------+--------+--------+--------+------------+---------+ ICA Distal59      30                                    +----------+--------+--------+--------+------------+---------+ ECA       466             >50%                          +----------+--------+--------+--------+------------+---------+ +----------+--------+-------+--------+-------------------+           PSV cm/sEDV cmsDescribeArm Pressure (mmHG) +----------+--------+-------+--------+-------------------+ [id number]                                         +----------+--------+-------+--------+-------------------+ +---------+--------+--+--------+-+---------+ VertebralPSV cm/s30EDV cm/s9Antegrade +---------+--------+--+--------+-+---------+  Left Carotid Findings: +----------+--------+--------+--------+------------+--------+           PSV cm/sEDV cm/sStenosisDescribe    Comments +----------+--------+--------+--------+------------+--------+ CCA Prox  70      19              heterogenous         +----------+--------+--------+--------+------------+--------+ CCA Distal52      16              heterogenous         +----------+--------+--------+--------+------------+--------+  ECA       392             >50%                         +----------+--------+--------+--------+------------+--------+  +----------+--------+--------+--------+-------------------+ SubclavianPSV cm/sEDV cm/sDescribeArm Pressure (mmHG) +----------+--------+--------+--------+-------------------+           85                                          +----------+--------+--------+--------+-------------------+ +---------+--------+--+--------+--+---------+ VertebralPSV cm/s93EDV cm/s25Antegrade +---------+--------+--+--------+--+---------+  Left Stent(s): +---------------+--+--+-------------+++ Prox to Stent  6416              +---------------+--+--+-------------+++ Proximal Stent 7215<50% stenosis +---------------+--+--+-------------+++ Mid Stent      7535              +---------------+--+--+-------------+++ Distal Stent   7837              +---------------+--+--+-------------+++ Distal to [id number]              +---------------+--+--+-------------+++  Summary: Right Carotid: Velocities in the right ICA are consistent with a 1-39% stenosis.                The ECA appears >50% stenosed. Left Carotid: ICA stent <50% stenosis. The ECA appears >50% stenosed. Vertebrals: Bilateral vertebral arteries demonstrate antegrade flow. *See table(s) above for measurements and observations.  Electronically signed by Harold Barban MD on 09/11/2018 at 10:07:51 AM.    Final     Labs:  Basic Metabolic Panel: Recent Labs  Lab 09/11/18 2229 09/12/18 0742 09/13/18 0317 09/14/18 0538 09/14/18 1242 09/15/18 0624 09/17/18 1009 09/18/18 0023  NA 140 135 135 134* 131* 133* 131* 130*  K 2.8* 3.3* 3.6 2.6* 3.3* 3.1* 3.8 4.0  CL 90* 83* 80* 75* 74* 81* 89* 93*  CO2 38* 38* 42* 42* 39* 36* 29 27  GLUCOSE 196* 227* 204* 178* 281* 184* 272* 181*  BUN 22 26* 29* 29* 28* 29* 31* 43*  CREATININE 1.41* 1.24* 1.52* 1.61* 1.72* 1.68* 1.87* 3.14*  CALCIUM 8.6* 8.8* 9.2 9.6 9.9 9.5 9.1 8.3*  MG 2.1  --   --   --   --   --   --   --     CBC: Recent Labs  Lab 09/13/18 0317 09/14/18 0538  09/18/18 0023  WBC 10.9* 11.3* 13.5*  NEUTROABS  --  8.8*  --   HGB 10.9* 12.3 10.4*  HCT 33.9* 38.6 32.5*  MCV 89.0 87.9 88.1  PLT 191 196 204    CBG: Recent Labs  Lab 09/17/18 0608 09/17/18 1140 09/17/18 1652 09/17/18 2050 09/18/18 0341  GLUCAP 258* 219* 180* 189* 135*   Family history.  Father with CAD, diabetes mellitus, hyperlipidemia, hypertension, deep vein thrombosis, abdominal aortic aneurysm.  Mother with hypertension as well as DVT.  Sister with ovarian cancer and hypertension.  Paternal grandfather with myocardial infarction, paternal grandmother with diabetes mellitus.  Negative colon cancer  Brief HPI: [name] [name] is a 73 year old female history of hypertension hyperlipidemia, type 2 diabetes mellitus, asthma, CVA, OSA not on CPAP, CAD status post PCI with CABG, systolic congestive heart failure with ejection fraction atrial fibrillation on Eliquis as well as Plavix followed by cardiology services, left bundle branch block, CKD stage III.  Per chart review lives with spouse using rolling walker prior to admission reports of  multiple falls.  Husband had stated patient had been a skilled nursing facility until end of July for bouts of pneumonia.  Admitted 09/09/2018 09/09/2018 with transient slurred speech, dizziness, vomiting as well as fall x3 without loss of consciousness.  Noted bruises to left upper back and left leg.  White blood cell count 14,500, creatinine 1.13, lactic acid 1.2, COVID negative, urinalysis negative, potassium 3.3, low-grade fever 100.9 blood pressure 177/71.  Chest x-ray showed cardiomegaly and vascular congestion with mild pulmonary edema as well as developing bronchial pneumonia right lower lobe.  MRSA PCR screening positive.  X-rays of left femur and left rib negative for fracture.  CT of the head as well as MRI and MRA showed no acute ischemic infarct or intracranial hemorrhage.  Severe stenosis of left PCA P1 and P2 segments unchanged from compared to  06/01/2014.  Neurology follow-up to address MRI suspect subacute to chronic right frontal lobe infarction new from prior scan of February 2020.  It was advised to continue Eliquis and Plavix as prior to admission.  Maintained on vancomycin and cefepime with fevers and leukocytosis improved and changed to Levaquin 812 20203 doses.  She did receive low-dose diuresis monitoring for any signs of hypoxia.  Tolerating a regular diet.  Patient was admitted for a comprehensive rehab program.  Hospital Course: [name] [name] was admitted to rehab 09/18/2018 for inpatient therapies to consist of PT, ST and OT at least three hours five days a week. Past admission physiatrist, therapy team and rehab RN have worked together to provide customized collaborative inpatient rehab.  Pertaining to patient decreased functional mobility secondary to chronic frontal lobe infarct complicated by acute respiratory failure with sepsis lobar pneumonia she was attending therapy she continued on Eliquis and Plavix as prior to admission at the recommendations of neurology service as well as followed by cardiology services.  Pain management use of Neurontin as well as oxycodone for chronic back pain that she was using on a limited basis.  Mood stabilization with Cymbalta, Xanax and Wellbutrin.  Diabetes mellitus hemoglobin A1c of 7 Lantus insulin as well as NovoLog.  Hospital work-up noted MRSA PCR screening positive she was completing a course of Levaquin.  CKD stage III admission creatinine 1.13 elevated to 1.68 BUN 29 and monitored.  History of asthma not on CPAP she had refused continued with inhalers 2 L oxygen as needed.  She did have a history of CAD with CABG and continue with Plavix.  Diastolic and systolic congestive heart failure monitor for any signs of fluid overload last echocardiogram ejection fraction of 30%.  Hospital course rehab services complicated by orthostatic hypotension with relaxed fluid restrictions.  She was placed on  IV fluids with bolus and rapid response contacted.  Chest x-ray showed no radiographic evidence of acute pulmonary disease.  Systolic blood pressure in the 70s and not responding well to fluid bolus as expected.  Cardiology services as well as medicine team consulted patient was discharged to acute care services for ongoing care.   Physical exam.  Initial blood pressure 151/69 pulse 65 temperature 97.6 respirations 18 oxygen saturation 98% room air Constitutional alert and oriented speech slightly slurred but intelligible HEENT Head.  Normocephalic and atraumatic Eyes pupils round and reactive to light without discharge no nystagmus Neck was supple nontender no JVD no tracheal deviation present Cardiovascular normal rate and rhythm exam reveals no gallop no friction rub no murmur heard Respiratory breath sounds normal no respiratory distress no wheezes no rails exhibits no tenderness  GI.  Soft bowel sounds normal exhibits no distention nontender She did have a large bruise on right mid abdomen that was purple and yellow Musculoskeletal.  Lower extremities hip flexors 3 out of 5 in hip flexors bilateral lower extremities distally 4 to 4+ out of 5 lower extremities bilaterally decreased range of motion at left ankle. Neurological alert and oriented follows commands fair medical historian she had some halting speech no facial sensory changes or motor changes otherwise  Rehab course: During patient's stay in rehab weekly team conferences were held to monitor patient's progress, set goals and discuss barriers to discharge. At admission, patient required minimum to moderate assist ambulate 60 feet rolling walker, moderate assist stand pivot transfers, moderate assist sit to supine, minimal assist supine to sit.  Minimal assist upper body bathing minimal assist lower body bathing minimal assist upper body dressing minimal assist lower body dressing.  [name] [name]  has had improvement in activity tolerance,  balance, postural control as well as ability to compensate for deficits. [name] [name] has had improvement in functional use RUE/LUE  and RLE/LLE as well as improvement in awareness.  Supine to sit transfers minimal assist for pelvic positioning.  Close monitoring of blood pressure went up with therapy standing she would drop to 84/53.  Perform sit to supine complete with minimal assistance.  Working with energy conservation techniques.  Close monitoring for any falls.  Stand pivot back to her wheelchair with minimal assistance.  She had occasional bouts of nausea.  Completed upper body dressing and bathing from wheelchair to sink with assistance to fasten her bra in the back.  Worked on lower body dressing and sit to stand with patient tolerating standing for 1 minute       Disposition: Discharge acute care services   Diet: Diabetic diet  Special Instructions: Follow-up per internal medicine and cardiology services  Medication changes made as per acute care services      Signed: Lavon Paganini Samrat Hayward 09/18/2018, 5:19 AM

## 2018-09-18 NOTE — Progress Notes (Signed)
eLink Physician-Brief Progress Note Patient Name: [name] [name] DOB: February 05, 1945 [name]: [id number]   Date of Service  09/18/2018  HPI/Events of Note  73 year old woman with prior stroke, A fib on eliquis, CKD, HTN, recent pneumonia, has had episodes of orthostatic hypotension recently. She was given toprol XL, norvasc and an ACE inhibitor yesterday. Noted to have hypotension and bradycardia not responsive to fluids. Started on dopamine per cardiology. Most recent EF improved from prior. Significant AKI on labs. Brought to ICU for pressor support and has been seen by bedside MD Dr Mariane Masters. Bedside Rn notes BP and HR improved. Has mild confusion but has currently improved, following all commands, says she has nausea. Otherwise in no distress on camera.   eICU Interventions  - Dopamine to continue to maintain HR and MAP > 65 -Follow serial BMP  -May need to consider renal imaging if no improvement -Doubt sepsis, and no signs of bleeding -Zofran ordered for nausea -Follow EKG -Is on eliquis already Please call us if needed     Intervention Category Major Interventions: Arrhythmia - evaluation and management;Shock - evaluation and management Evaluation Type: New Patient Evaluation  [name] [name] 09/18/2018, 3:59 AM

## 2018-09-18 NOTE — Progress Notes (Signed)
Patient difficult to arouse, mumbles with noxious stimuli.  [name] [name] at bedside and aware.  VSS at this time.  Pupils 4 brisk reaction to light.  No further intervention at this time, will continue to monitor.

## 2018-09-18 NOTE — Progress Notes (Signed)
Patient's husband [name] updated via phone

## 2018-09-18 NOTE — Plan of Care (Signed)
Patient oriented and speech is appropriate, following commands and answering questions appropriately

## 2018-09-18 NOTE — Progress Notes (Signed)
Report was given to [facility] nurse. Afterwards, nurse found pt to be lethargic, difficult to wake and skin was clammy.  Immediately sent down to 2 M.  Lupita Dawn, RN

## 2018-09-18 NOTE — Progress Notes (Signed)
Pt transferred to 4E [id number] via hospital bed with pt's belongings. Report given to Sharrie Rothman, RN.

## 2018-09-18 NOTE — Significant Event (Addendum)
Rapid Response Event Note Follow up from previous event/Tx to PCU  Initial Focused Assessment: [name] [name] has persistent hypotension after 3L NS resuscitation. Dopamine gtt ordered per [facility].  PCCM consulted. HR 42 afib, 76/40, RR 16 with sats 99% on RA. Dr. Mariane Masters at bedside.  Interventions: -New PIV -Dopamine gtt initiated at 28mcg/kg/min -tx ICU  0314: HR 52 afib, 107/46 (65), RR 16 sats 99% on RA    [name] [name]

## 2018-09-18 NOTE — Progress Notes (Signed)
NAME:  [name] [name], [name]:  [id number], DOB:  [birth year], LOS: 0 ADMISSION DATE:  09/18/2018, CONSULTATION DATE: 09/18/2018 REFERRING MD: [facility] hospitalist, CHIEF COMPLAINT: Bradycardia/hypotension  Brief History   73 yo female consulted for bradycardia and hypotension  History of present illness   Patient is a 73 year old female discharged from the hospital about 6 weeks ago with an ORIF of the left ankle.  She spent 4 weeks in rehab and was evaluated in the emergency room on 8/8 after a fall.  At that time she was felt to be dehydrated.  Her creatinine at time admission was baseline, her creatinine runs about 1.3-1.6.  She is received 2 L normal saline over the last 12 hours with little improvement in her blood pressure heart rate.  Over the last 18 hours she has become progressively bradycardic and hypotensive.  She has had elevation in her creatinine to 3.14.  Her pulse is been 40-60 with a blood pressure currently systolic of 75 diastolic of 40.  On my evaluation she is awake alert.  She tells me that she was out of bed yesterday and felt somewhat weak.  Her anion gap currently is 10, white count is slightly elevated at 13.5 hemoglobin stable at 10.4.  Echocardiogram performed yesterday shows improvement in ejection fraction from 25 to 30% up to 45 to 50%.  She was continued on metoprolol and amlodipine for previously noted high blood pressure until yesterday evening.  Her prior medical history pertinent for hypertension hyperlipidemia diabetes mellitus asthma stroke gastroesophageal reflux disease depression anxiety peripheral vascular disease OSA not on CPAP coronary artery disease previous stent with previous history of ejection fraction of 25% and stage III chronic kidney disease.  Past Medical History    Abnormality of gait 05/14/2014  . Anemia    hx  . Anginal pain ([place])   . Anxiety   . Asthma   . Basal cell carcinoma 05/2014   "left shoulder"  . Bundle branch block, left     chronic/notes 07/18/2013  . CHF (congestive heart failure) ([place])   . Chronic bronchitis ([place])    "off and on all the time" (07/18/2013)  . Chronic insomnia     . Chronic kidney disease    frequency, sees dr Jamal Maes every 4 to 6 months (01/16/2018)  . Chronic low back pain 08/24/2016  . Chronic lower back pain   . Claustrophobia   . Common migraine 05/14/2014  . Coronary artery disease   . Depression   . Dysrhythmia   . GERD (gastroesophageal reflux disease)   . H/O hiatal hernia   . Headache    "at least 2/month" (01/16/2018)  . Heart murmur   . Hyperlipidemia   . Hypertension   . Irregular heart beat   . Leg cramps    both legs at times  . Melanoma ([place]) 05/2014   "burned off BLE" (01/16/2018)  . Memory change 05/14/2014  . Migraine    "5-6/year"  (01/16/2018)  . Myocardial infarction ([place])      04/1999, 02/2000, 01/2005; 2011; 2014   . Neuromuscular disorder ([place])    ?  . Obesity 01-2010  . Obstructive sleep apnea    "can't wear machine; I have claustrophobia" (01/16/2018), states she had a 2nd sleep study and does not have sleep apnea, her O2 decreases and now is on 2 L of O2 at night.  . On home oxygen therapy    "2L at night and prn during daytime" (01/16/2018)  . Osteoarthritis    "  knees and hands" (01/16/2018)  . Other and unspecified angina pectoris   . Peripheral vascular disease (HCC)    ? numbness, tingling arms and legs  . Pneumonia 2000's   "once"  . PONV (postoperative nausea and vomiting)   . Shortness of breath    with exertion  . Stroke ([place]) 03/22/12   right side brain; denies residual on 07/18/2013)  . Stroke [facility]) Oct. 2015   Affected pt.s balance  . Stroke [facility]) 10/2014   "affected my legs; fully recovered now"; still have sporatic memory issues from this one" (01/16/2018)  . Type II diabetes mellitus ([place])   . Ventral hernia    hx of  . Vomiting    persistent  . Vomiting blood       Significant Hospital Events     Consults:  Hospitalists, cardiology  Procedures:  NA   Significant Diagnostic Tests:  NA  Micro Data:  NA  Antimicrobials:  NA  Interim history/subjective:  NA  Objective   Blood pressure (!) 76/40, pulse (!) 43, resp. rate (!) 22, SpO2 95 %.       No intake or output data in the 24 hours ending 09/18/18 0311 There were no vitals filed for this visit.  Examination: General: well developed WF in NAD  HENT: Wnl Lungs:Clear Cardiovascular: Reg Abdomen:Rotund, benign Extremities: trace edema Neuro: awake alert oriented, not focal GU: NA  Resolved Hospital Problem list     Assessment & Plan:  1.  Bradycardia/hypotension this seems most likely secondary to metoprolol and amlodipine at this point.  Reduced heart rate and blood pressure have developed slowly over the last couple of days.  We will hold these drugs and start peripheral dopamine and monitor.  Patient being transferred to the ICU.  Will repeat BNP and CBC.  Lactic acid is 1.6 anion gap is 10.  2.  Worsening chronic kidney disease: Seems most likely related to progressive hypotension and bradycardia with reduced cardiac output.  Will need to follow closely.  Difficult to say whether this will return to baseline.  3.  Generalized weakness status post ORIF      Best practice:  Diet: Regular Pain/Anxiety/Delirium protocol (if indicated): We will resume previous antidepressants VAP protocol (if indicated): N/A DVT prophylaxis: Lovenox/SCDs GI prophylaxis: N/A Glucose control: NA Mobility: Bedrest Code Status: Full Family Communication: Discussed with patient Disposition: Transfer to ICU  Labs   CBC: Recent Labs  Lab 09/12/18 0309 09/12/18 0742 09/13/18 0317 09/14/18 0538 09/18/18 0023  WBC 15.0* 13.9* 10.9* 11.3* 13.5*  NEUTROABS  --   --   --  8.8*  --   HGB 10.9* 10.7* 10.9* 12.3 10.4*  HCT 35.2* 34.5* 33.9* 38.6 32.5*  MCV 92.1 91.5 89.0 87.9 88.1  PLT  192 181 191 196 834    Basic Metabolic Panel: Recent Labs  Lab 09/11/18 0647  09/14/18 0538 09/14/18 1242 09/15/18 0624 09/17/18 1009 09/18/18 0023  NA 140   < > 134* 131* 133* 131* 130*  K 2.8*   < > 2.6* 3.3* 3.1* 3.8 4.0  CL 90*   < > 75* 74* 81* 89* 93*  CO2 38*   < > 42* 39* 36* 29 27  GLUCOSE 196*   < > 178* 281* 184* 272* 181*  BUN 22   < > 29* 28* 29* 31* 43*  CREATININE 1.41*   < > 1.61* 1.72* 1.68* 1.87* 3.14*  CALCIUM 8.6*   < > 9.6 9.9 9.5 9.1 8.3*  MG  2.1  --   --   --   --   --   --    < > = values in this interval not displayed.   GFR: Estimated Creatinine Clearance: 19.2 mL/min (A) (by C-G formula based on SCr of 3.14 mg/dL (H)). Recent Labs  Lab 09/12/18 0742 09/13/18 0317 09/14/18 0538 09/18/18 0023  WBC 13.9* 10.9* 11.3* 13.5*  LATICACIDVEN  --   --   --  1.6    Liver Function Tests: Recent Labs  Lab 09/14/18 0538 09/18/18 0023  AST 40 23  ALT 32 19  ALKPHOS 104 89  BILITOT 1.4* 0.8  PROT 6.9 5.1*  ALBUMIN 2.9* 2.5*   No results for input(s): LIPASE, AMYLASE in the last 168 hours. No results for input(s): AMMONIA in the last 168 hours.  ABG    Component Value Date/Time   PHART 7.435 09/10/2018 1000   PCO2ART 49.4 (H) 09/10/2018 1000   PO2ART 58.4 (L) 09/10/2018 1000   HCO3 32.6 (H) 09/10/2018 1000   TCO2 >50 (H) 04/01/2018 1648   ACIDBASEDEF 2.0 01/28/2010 1651   O2SAT 89.7 09/10/2018 1000     Coagulation Profile: No results for input(s): INR, PROTIME in the last 168 hours.  Cardiac Enzymes: No results for input(s): CKTOTAL, CKMB, CKMBINDEX, TROPONINI in the last 168 hours.  HbA1C: Hgb A1c MFr Bld  Date/Time Value Ref Range Status  09/10/2018 09:36 AM 7.0 (H) 4.8 - 5.6 % Final    Comment:    (NOTE) Pre diabetes:          5.7%-6.4% Diabetes:              >6.4% Glycemic control for   <7.0% adults with diabetes   05/16/2018 06:23 AM 6.9 (H) 4.8 - 5.6 % Final    Comment:    (NOTE)         Prediabetes: 5.7 - 6.4          Diabetes: >6.4         Glycemic control for adults with diabetes: <7.0     CBG: Recent Labs  Lab 09/16/18 2118 09/17/18 0608 09/17/18 1140 09/17/18 1652 09/17/18 2050  GLUCAP 193* 258* 219* 180* 189*    Review of Systems:   Unremarkable other than that mentioned in HPI  Past Medical History  She,  has a past medical history of Abnormality of gait (05/14/2014), Anemia, Anginal pain ([place]), Anxiety, Asthma, Basal cell carcinoma (05/2014), Bundle branch block, left, CHF (congestive heart failure) (HCC), Chronic bronchitis (HCC), Chronic insomnia (05/06/2015), Chronic kidney disease, Chronic low back pain (08/24/2016), Chronic lower back pain, Claustrophobia, Common migraine (05/14/2014), Coronary artery disease, Depression, Dysrhythmia, GERD (gastroesophageal reflux disease), H/O hiatal hernia, Headache, Heart murmur, Hyperlipidemia, Hypertension, Irregular heart beat, Leg cramps, Melanoma ([place]) (05/2014), Memory change (05/14/2014), Migraine, Myocardial infarction ([place]) (04/1999, 02/2000, 01/2005; 2011; 2014), Neuromuscular disorder ([place]), Obesity (01-2010), Obstructive sleep apnea, On home oxygen therapy, Osteoarthritis, Other and unspecified angina pectoris, Peripheral vascular disease ([place]), Pneumonia (2000's), PONV (postoperative nausea and vomiting), Shortness of breath, Stroke ([place]) (03/22/12), Stroke [facility]) (Oct. 2015), Stroke [facility]) (10/2014), Type II diabetes mellitus ([place]), Ventral hernia, Vomiting, and Vomiting blood.   Surgical History    Past Surgical History:  Procedure Laterality Date  . ANKLE FRACTURE SURGERY Left 1970's  . APPENDECTOMY  1970's   w/hysterectomy  . BASAL CELL CARCINOMA EXCISION Left 05/2014   "shoulder" (01/16/2018)  . CARDIAC CATHETERIZATION  10/10/2012   Dr [name].  Marland Kitchen CARDIAC CATHETERIZATION N/A 05/29/2015  Procedure: Right/Left Heart Cath and Coronary/Graft Angiography;  Surgeon: Larey Dresser, MD;  Location: [place] CV LAB;  Service: Cardiovascular;   Laterality: N/A;  . CAROTID ENDARTERECTOMY Left 03/2013  . CAROTID STENT INSERTION Left 03/20/2013   Procedure: CAROTID STENT INSERTION;  Surgeon: [name], MD;  Location: [facility] CATH LAB;  Service: Cardiovascular;  Laterality: Left;  internal carotid  . CEREBRAL ANGIOGRAM N/A 04/05/2011   Procedure: CEREBRAL ANGIOGRAM;  Surgeon: Angelia Mould, MD;  Location: [facility] CATH LAB;  Service: Cardiovascular;  Laterality: N/A;  . CHOLECYSTECTOMY OPEN  2004  . CORONARY ANGIOPLASTY WITH STENT PLACEMENT  01,02,05,06,07,08,11; 04/24/2013   "I've probably got ~ 10 stents by now" (04/24/2013)  . CORONARY ANGIOPLASTY WITH STENT PLACEMENT  06/13/2013   "got 4 stents today" (06/13/2013)  . CORONARY ARTERY BYPASS GRAFT  1220/11   "CABG X5"  . CORONARY STENT INTERVENTION N/A 10/20/2017   Procedure: CORONARY STENT INTERVENTION;  Surgeon: Troy Sine, MD;  Location: [place] CV LAB;  Service: Cardiovascular;  Laterality: N/A;  . ESOPHAGOGASTRODUODENOSCOPY  08/03/2011   Procedure: ESOPHAGOGASTRODUODENOSCOPY (EGD);  Surgeon: Shann Medal, MD;  Location: [facility];  Service: General;  Laterality: N/A;  . ESOPHAGOGASTRODUODENOSCOPY (EGD) WITH PROPOFOL N/A 03/11/2014   Procedure: ESOPHAGOGASTRODUODENOSCOPY (EGD) WITH PROPOFOL;  Surgeon: Lafayette Dragon, MD;  Location: [facility] ENDOSCOPY;  Service: Endoscopy;  Laterality: N/A;  . FRACTURE SURGERY    . gall stone removal  05/2003  . GASTRIC RESTRICTION SURGERY  1984   "stapeling"  . HERNIA REPAIR  2004   "in my stomach; had OR on it twice", wire mesh on 1 hernia  . LEFT HEART CATH AND CORS/GRAFTS ANGIOGRAPHY N/A 07/09/2016   Procedure: LEFT HEART CATH AND CORS/GRAFTS ANGIOGRAPHY;  Surgeon: Larey Dresser, MD;  Location: [place] CV LAB;  Service: Cardiovascular;  Laterality: N/A;  . ORIF ANKLE FRACTURE Left 05/16/2018  . ORIF ANKLE FRACTURE Left 05/16/2018   Procedure: OPEN REDUCTION INTERNAL FIXATION (ORIF) Left ankle with possible syndesmosis fixation;  Surgeon:  Nicholes Stairs, MD;  Location: [place];  Service: Orthopedics;  Laterality: Left;  167min  . OVARY SURGERY  1970's   "tumor removed"  . PERCUTANEOUS CORONARY STENT INTERVENTION (PCI-S) N/A 06/13/2013   Procedure: PERCUTANEOUS CORONARY STENT INTERVENTION (PCI-S);  Surgeon: [name], MD;  Location: [facility] CATH LAB;  Service: Cardiovascular;  Laterality: N/A;  . PERCUTANEOUS STENT INTERVENTION N/A 04/24/2013   Procedure: PERCUTANEOUS STENT INTERVENTION;  Surgeon: [name], MD;  Location: [facility] CATH LAB;  Service: Cardiovascular;  Laterality: N/A;  . RIGHT/LEFT HEART CATH AND CORONARY/GRAFT ANGIOGRAPHY N/A 10/20/2017   Procedure: RIGHT/LEFT HEART CATH AND CORONARY/GRAFT ANGIOGRAPHY;  Surgeon: Larey Dresser, MD;  Location: [place] CV LAB;  Service: Cardiovascular;  Laterality: N/A;  . ROOT CANAL  10/2000  . TIBIA FRACTURE SURGERY Right 1970's   rods and pins  . TOOTH EXTRACTION     "1 on the upper; wisdom tooth on the lower" (01/16/2018)  . TOTAL ABDOMINAL HYSTERECTOMY  1970's   w/ appendectomy     Social History   reports that she has never smoked. She has never used smokeless tobacco. She reports that she does not drink alcohol or use drugs.   Family History   Her family history includes AAA (abdominal aortic aneurysm) in her father and mother; Cancer in her sister; Deep vein thrombosis in her father and mother; Diabetes in her father, paternal aunt, paternal grandmother, paternal uncle, and paternal uncle; Heart attack in  her father; Heart attack (age of onset: 46) in her paternal grandfather; Heart disease in her father and paternal uncle; Hyperlipidemia in her father; Hypertension in her father, mother, and sister. There is no history of Colon cancer.   Allergies Allergies  Allergen Reactions  . Amoxicillin Shortness Of Breath and Rash  . Brilinta [Ticagrelor] Shortness Of Breath  . Erythromycin Shortness Of Breath, Other (See Comments) and Hives    Trouble  swallowing  . Flagyl [Metronidazole] Shortness Of Breath and Palpitations  . Penicillins Hives, Shortness Of Breath, Rash and Other (See Comments)    Has patient had a PCN reaction causing immediate rash, facial/tongue/throat swelling, SOB or lightheadedness with hypotension: Yes Has patient had a PCN reaction causing severe rash involving mucus membranes or skin necrosis: No Has patient had a PCN reaction that required hospitalization: Yes Has patient had a PCN reaction occurring within the last 10 years: No If all of the above answers are "NO", then may proceed with Cephalosporin use.   . Isosorbide Mononitrate [Isosorbide Nitrate] Other (See Comments)    Joint aches, muscles hurt, difficult to walk   . Jardiance [Empagliflozin] Other (See Comments)    Nausea, joint aches, muscles aches  . Metformin And Related Other (See Comments)    Stomach pain, cold sweats, joint pain, burred vision, dizziness  . Tape Other (See Comments)    Skin pulls off with certain types Plastic tape causes skin to rip if left on for long periods of time  . Erythromycin Base Rash     Home Medications  Prior to Admission medications   Medication Sig Start Date End Date Taking? Authorizing Provider  ADVAIR HFA 115-21 MCG/ACT inhaler Inhale 2 puffs into the lungs. 08/02/18   [provider]  albuterol (VENTOLIN HFA) 108 (90 Base) MCG/ACT inhaler Inhale 2 puffs into the lungs every 6 (six) hours as needed for wheezing.    [provider]  Alirocumab (PRALUENT) 150 MG/ML SOAJ Inject 1 pen into the skin every 14 (fourteen) days. 08/18/18   Larey Dresser, MD  ALPRAZolam Duanne Moron) 0.5 MG tablet Take 1 tablet (0.5 mg total) by mouth 2 (two) times daily. 04/10/18   Cherene Altes, MD  amLODipine (NORVASC) 10 MG tablet Take 10 mg by mouth daily.    [provider]  apixaban (ELIQUIS) 5 MG TABS tablet Take 1 tablet (5 mg total) by mouth 2 (two) times daily. 05/12/18   Larey Dresser, MD   bisacodyl (DULCOLAX) 5 MG EC tablet Take 1 tablet (5 mg total) by mouth daily as needed for moderate constipation. 05/19/18 05/19/19  Nicholes Stairs, MD  budesonide (PULMICORT) 0.25 MG/2ML nebulizer solution Take 2 mLs (0.25 mg total) by nebulization 2 (two) times daily. 04/10/18   Cherene Altes, MD  buPROPion (WELLBUTRIN XL) 150 MG 24 hr tablet Take 150 mg by mouth daily.    [provider]  clopidogrel (PLAVIX) 75 MG tablet Take 1 tablet (75 mg total) by mouth at bedtime. 05/12/18   Larey Dresser, MD  denosumab (PROLIA) 60 MG/ML SOSY injection Inject 69 mg into the skin every 6 (six) months.    [provider]  DULoxetine (CYMBALTA) 30 MG capsule Take 1 capsule (30 mg total) by mouth daily. 04/11/18   Cherene Altes, MD  ezetimibe (ZETIA) 10 MG tablet Take 1 tablet (10 mg total) by mouth at bedtime. 04/10/18   Cherene Altes, MD  gabapentin (NEURONTIN) 300 MG capsule Take 2 capsules (600 mg  total) by mouth 2 (two) times daily. 04/10/18   Cherene Altes, MD  insulin aspart (NOVOLOG) 100 UNIT/ML injection Inject 8 Units into the skin 3 (three) times daily with meals. Patient taking differently: Inject 20 Units into the skin 3 (three) times daily.  04/10/18   Cherene Altes, MD  insulin glargine (LANTUS) 100 UNIT/ML injection Inject 0.35 mLs (35 Units total) into the skin at bedtime. Patient taking differently: Inject 38 Units into the skin at bedtime.  04/10/18   Cherene Altes, MD  ipratropium-albuterol (DUONEB) 0.5-2.5 (3) MG/3ML SOLN Take 3 mLs by nebulization 2 (two) times daily. Patient taking differently: Take 3 mLs by nebulization 2 (two) times daily as needed (shortness of breath).  04/10/18   Cherene Altes, MD  metolazone (ZAROXOLYN) 2.5 MG tablet Take 2.5 mg by mouth daily.    [provider]  metoprolol succinate (TOPROL-XL) 50 MG 24 hr tablet Take 25mg  daily with or immediately following a meal. 08/29/18   Larey Dresser, MD  Multiple  Vitamins-Minerals (HEALTHY EYES/LUTEIN) TABS Take 1 tablet by mouth daily.    [provider]  nitroGLYCERIN (NITROSTAT) 0.3 MG SL tablet Place 1 tablet (0.3 mg total) under the tongue every 5 (five) minutes x 3 doses as needed for chest pain. 09/15/17   Georgiana Shore, NP  ondansetron (ZOFRAN) 4 MG tablet Take 4 mg by mouth every 6 (six) hours as needed for nausea/vomiting.    [provider]  oxyCODONE (OXY IR/ROXICODONE) 5 MG immediate release tablet Take 1 tablet (5 mg total) by mouth every 4 (four) hours as needed for moderate pain or severe pain. Patient not taking: Reported on 09/09/2018 05/19/18   Nicholes Stairs, MD  OXYGEN Inhale 2 L into the lungs at bedtime.     [provider]  pantoprazole (PROTONIX) 40 MG tablet Take 1 tablet (40 mg total) by mouth daily. 04/11/18   Cherene Altes, MD  potassium chloride SA (K-DUR) 20 MEQ tablet TAKE ONE (1) TABLET BY MOUTH TWO (2) TIMES DAILY 09/12/18   Larey Dresser, MD  ranolazine (RANEXA) 1000 MG SR tablet Take 1,000 mg by mouth every 12 (twelve) hours.    [provider]  rosuvastatin (CRESTOR) 40 MG tablet Take 1 tablet (40 mg total) by mouth at bedtime. 04/10/18   Cherene Altes, MD  spironolactone (ALDACTONE) 25 MG tablet Take 1 tablet (25 mg total) by mouth daily. Patient not taking: Reported on 09/09/2018 04/11/18   Cherene Altes, MD  torsemide (DEMADEX) 20 MG tablet Take 1 tablet (20 mg total) by mouth every other day. 06/02/18   Georgiana Shore, NP  traMADol (ULTRAM) 50 MG tablet Take 1 tablet (50 mg total) by mouth every 6 (six) hours as needed for moderate pain. Patient not taking: Reported on 09/09/2018 05/19/18   Nicholes Stairs, MD  triamcinolone cream (KENALOG) 0.1 % Apply 1 application topically daily. 08/02/18   [provider]  vitamin B-12 (CYANOCOBALAMIN) 1000 MCG tablet Take 1,000 mcg by mouth daily.    [provider]  zolpidem (AMBIEN) 10 MG tablet Take 10 mg by  mouth every evening.    [provider]     Critical care time: 35 minutes spent in bedside evaluation, discussion with care team, establishing critical care plan.

## 2018-09-18 NOTE — Progress Notes (Signed)
Based on pt's blood pressures, 76/40, the attending physician ordered a 1 L bolus of normal saline at 999 mL/hr. Dr also ordered to start the pt on an IV dopamine drip at 3.2 mg/mL. Informed nurse to take a manual blood pressure once the bolus is complete.  Will continue to monitor pt.  Lupita Dawn, RN

## 2018-09-18 NOTE — Progress Notes (Signed)
Pt seen by Dr. Wandra Feinstein. RN was informed that patient will be transferred to progressive care floor. Rapid Response RN aware.  Pt called husband and informed of decision to transfer her. Awaiting for room number.

## 2018-09-18 NOTE — Progress Notes (Signed)
CT of head completed.

## 2018-09-18 NOTE — Progress Notes (Signed)
NAME:  [name] [name], [name]:  [id number], DOB:  [birth year], LOS: 0 ADMISSION DATE:  09/18/2018    Brief History   73 y.o. F transferred from rehab for persistent bradycardia and hypotension.  History of present illness   Patient is a 73 year old female with significant  PMH of CAD, CHF, DM, Atrial fibrillation and CVA who has been admitted several times this year. First in 03/2018 for respiratory failure 2/2 HFrEF and PNA, was discharged to rehab and then fell sustaining fx of the L ankle requiring re-admission in 05/2018.   Most recently, was admitted on 09/09/2018 after a fall with slurred speech and was evaluated for stroke, no acute infarct on MRI.  She was discharged to rehab on 8/12.   She was felt to be volume overloaded and aggressively diuresed with Demadex, Spironolactone and Metolazone and maintained on Losartan, Metoprolol and Amlodipine for HTN.    Over the last 18 hours she has become progressively bradycardic and hypotensive. Cardiology was consulted and discontinued diuretics.   Echocardiogram performed yesterday shows improvement in ejection fraction from 25 to 30% up to 45 to 50%  She also  had elevation in her creatinine to 3.14 (baseline 1.6-1.7) .   A rapid response was called for symptomatic BP of 75/40, afebrile.  She was given 3L IVF yesterday and BB held (last given evening of 8/16), Dopamine was initiated and she was transferred to the ICU.  In the early morning hours of 8/17 she had some slurred speech and a code stroke was called.    Past Medical History  CAD s/p CABG, HFrEF, CVA, Type 2 DM, Atrial Fibrillation on Eliquis   Significant Hospital Events   8/8-Admitted to hospitalist service 8/12-D/c to inpatient rehab 8/17-Rapid response for hypotension and bradycardia and transfer to ICU  Consults:  8/15 Cardiology 8/17 PCCM  Procedures:    Significant Diagnostic Tests:  8/8 CT head>> negative for acute CVA 8/9 MR/MRA head>>multiple areas of hyperintense  T2-weighted signal in the bilateral supratentorial white matter, likely subacute to chronic infarcts, severe intracranial atherosclerosis, severe stenosis of both internal carotid arteries 8/16 Echo>>EF 45-50%, mild LVH and diastolic dysfunction 1/74 Renal US>>normal 8/17 CT head>>atrophy with chronic ischemic changes deep cerebral white matter, no acute CVA   Micro Data:  8/17 Blood culture x2>>  Antimicrobials:  Cefepime>>8/8-8/12  Levofloxacin>>8/12-8/15 Vancomycin>>8/8-8/11  Interim history/subjective:  Code stroke called overnight for slurred speech which rapidly resolved, CT head negative, pt c/o headache this morning consistent with her chronic migraine  Objective   Blood pressure (!) 161/125, pulse (!) 50, temperature 97.9 F (36.6 C), temperature source Oral, resp. rate (!) 21, weight 108 kg, SpO2 100 %.        Intake/Output Summary (Last 24 hours) at 09/18/2018 0819 Last data filed at 09/18/2018 0700 Gross per 24 hour  Intake 609.24 ml  Output -  Net 609.24 ml   Filed Weights   09/18/18 0400  Weight: 108 kg    General:  Obese, elderly F in no acute distress HEENT: MM pink/moist Neuro: slightly delayed in answering questions, Moving all extremities, speech clear, oriented x3 CV: s1s2, bradycardic, no m/r/g PULM:  Lungs CTAB GI: soft, bsx4 active  Extremities: warm/dry, no edema, compression socks in place  Skin: no rashes or lesions   Resolved Hospital Problem list     Assessment & Plan:   Hypotension and Bradycardia likely secondary to diuresis and beta blocker in the setting of AKI -Remains on Dopamine  -Afebrile with clear CXR  P: -Wean Dopamine as able, may need central line if unable to titrate down  -Monitor HR, suspect HR will improve as Metoprolol is cleared -Hold diuretics and BP meds for now -Check UA to ensure to infection -Check Troponin    History of CVA, Code Stroke overnight -CT head negative  -Appears to be at neurologic baseline  P: -No further w/u per neurology   CAD, HTN, HL Systolic and Diastolic HF and history of atrial fibrillation -Echo 8/16 with improved EF of 61-60% and diastolic dysfunction -Cardiology following, appreciate recs P: -Holding Norvasc, diuretics and BB secondary to hypotension and bradycardia -Continue Crestor, Plavix and Eliquis   Acute Kidney Injury -Creatine up to 3.14 -Renal US normal P: -Monitor UOP and BMET  Type 2 DM P: -Continue Lantus, SSI and diabetic diet     Best practice:  Diet: diabetic Pain/Anxiety/Delirium protocol (if indicated): n/a VAP protocol (if indicated): n/a DVT prophylaxis: Eliquis GI prophylaxis: Protonix Glucose control: SSI, Lantus Mobility: with assist Code Status: Full  Family Communication:  Disposition: ICU  Labs   CBC: Recent Labs  Lab 09/12/18 0309 09/12/18 0742 09/13/18 0317 09/14/18 0538 09/18/18 0023  WBC 15.0* 13.9* 10.9* 11.3* 13.5*  NEUTROABS  --   --   --  8.8*  --   HGB 10.9* 10.7* 10.9* 12.3 10.4*  HCT 35.2* 34.5* 33.9* 38.6 32.5*  MCV 92.1 91.5 89.0 87.9 88.1  PLT 192 181 191 196 737    Basic Metabolic Panel: Recent Labs  Lab 09/14/18 0538 09/14/18 1242 09/15/18 0624 09/17/18 1009 09/18/18 0023  NA 134* 131* 133* 131* 130*  K 2.6* 3.3* 3.1* 3.8 4.0  CL 75* 74* 81* 89* 93*  CO2 42* 39* 36* 29 27  GLUCOSE 178* 281* 184* 272* 181*  BUN 29* 28* 29* 31* 43*  CREATININE 1.61* 1.72* 1.68* 1.87* 3.14*  CALCIUM 9.6 9.9 9.5 9.1 8.3*   GFR: Estimated Creatinine Clearance: 19.5 mL/min (A) (by C-G formula based on SCr of 3.14 mg/dL (H)). Recent Labs  Lab 09/12/18 0742 09/13/18 0317 09/14/18 0538 09/18/18 0023  WBC 13.9* 10.9* 11.3* 13.5*  LATICACIDVEN  --   --   --  1.6    Liver Function Tests: Recent Labs  Lab 09/14/18 0538 09/18/18 0023  AST 40 23  ALT 32 19  ALKPHOS 104 89  BILITOT 1.4* 0.8  PROT 6.9 5.1*  ALBUMIN 2.9* 2.5*   No results for input(s): LIPASE, AMYLASE in the last 168 hours.  No results for input(s): AMMONIA in the last 168 hours.  ABG    Component Value Date/Time   PHART 7.461 (H) 09/18/2018 0305   PCO2ART 39.9 09/18/2018 0305   PO2ART 68.2 (L) 09/18/2018 0305   HCO3 28.1 (H) 09/18/2018 0305   TCO2 >50 (H) 04/01/2018 1648   ACIDBASEDEF 2.0 01/28/2010 1651   O2SAT 93.0 09/18/2018 0305     Coagulation Profile: No results for input(s): INR, PROTIME in the last 168 hours.  Cardiac Enzymes: No results for input(s): CKTOTAL, CKMB, CKMBINDEX, TROPONINI in the last 168 hours.  HbA1C: Hgb A1c MFr Bld  Date/Time Value Ref Range Status  09/10/2018 09:36 AM 7.0 (H) 4.8 - 5.6 % Final    Comment:    (NOTE) Pre diabetes:          5.7%-6.4% Diabetes:              >6.4% Glycemic control for   <7.0% adults with diabetes   05/16/2018 06:23 AM 6.9 (H) 4.8 - 5.6 %  Final    Comment:    (NOTE)         Prediabetes: 5.7 - 6.4         Diabetes: >6.4         Glycemic control for adults with diabetes: <7.0     CBG: Recent Labs  Lab 09/17/18 1140 09/17/18 1652 09/17/18 2050 09/18/18 0341 09/18/18 0738  GLUCAP 219* 180* 189* 135* 181*     Critical care time: 33 minutes

## 2018-09-18 NOTE — Progress Notes (Signed)
Social Work Discharge Note   The overall goal for the admission was met for:   Discharge location: No - transferred back to acute due to medical issues  Length of Stay: No - on eval, projected CIR LOS was set for 10-12 days  Discharge activity level: No - on eval, overall goals set for supervision.  Family was able and prepared to provide 24/7 assist.  Home/community participation: No  Services provided included: MD, RD, PT, OT, RN, Pharmacy and SW  Financial Services: Medicare and Private Insurance: [place]  Follow-up services arranged: NA  Comments (or additional information):  Patient/Family verbalized understanding of follow-up arrangements: NA  Individual responsible for coordination of the follow-up plan: NA  Confirmed correct DME delivered:  NA  Sanjiv Castorena

## 2018-09-18 NOTE — Progress Notes (Signed)
[facility] Progress Note Patient Name: DEZZIE BADILLA DOB: 22-Aug-1945 [name]: [id number]   Date of Service  09/18/2018  HPI/Events of Note  Request to review CXR for L IJ CVL placement. L IJ CVL tip at cavoatrial junction. No pneumothorax.   eICU Interventions  OK to use L IJ CVL.      Intervention Category Intermediate Interventions: Diagnostic test evaluation  Lysle Dingwall 09/18/2018, 7:54 PM

## 2018-09-19 ENCOUNTER — Other Ambulatory Visit: Payer: Self-pay

## 2018-09-19 ENCOUNTER — Encounter (HOSPITAL_COMMUNITY): Payer: Self-pay | Admitting: *Deleted

## 2018-09-19 DIAGNOSIS — I5022 Chronic systolic (congestive) heart failure: Secondary | ICD-10-CM

## 2018-09-19 LAB — BASIC METABOLIC PANEL
Anion gap: 10 (ref 5–15)
BUN: 47 mg/dL — ABNORMAL HIGH (ref 8–23)
CO2: 27 mmol/L (ref 22–32)
Calcium: 9 mg/dL (ref 8.9–10.3)
Chloride: 96 mmol/L — ABNORMAL LOW (ref 98–111)
Creatinine, Ser: 2.99 mg/dL — ABNORMAL HIGH (ref 0.44–1.00)
GFR calc Af Amer: 18 mL/min — ABNORMAL LOW (ref >60)
GFR calc non Af Amer: 15 mL/min — ABNORMAL LOW (ref >60)
Glucose, Bld: 191 mg/dL — ABNORMAL HIGH (ref 70–99)
Potassium: 3.7 mmol/L (ref 3.5–5.1)
Sodium: 133 mmol/L — ABNORMAL LOW (ref 135–145)

## 2018-09-19 LAB — CBC
HCT: 32.5 % — ABNORMAL LOW (ref 36.0–46.0)
Hemoglobin: 10.5 g/dL — ABNORMAL LOW (ref 12.0–15.0)
MCH: 28.3 pg (ref 26.0–34.0)
MCHC: 32.3 g/dL (ref 30.0–36.0)
MCV: 87.6 fL (ref 80.0–100.0)
Platelets: 210 10*3/uL (ref 150–400)
RBC: 3.71 MIL/uL — ABNORMAL LOW (ref 3.87–5.11)
RDW: 14.9 % (ref 11.5–15.5)
WBC: 12.8 10*3/uL — ABNORMAL HIGH (ref 4.0–10.5)
nRBC: 0 % (ref 0.0–0.2)

## 2018-09-19 LAB — GLUCOSE, CAPILLARY
Glucose-Capillary: 159 mg/dL — ABNORMAL HIGH (ref 70–99)
Glucose-Capillary: 205 mg/dL — ABNORMAL HIGH (ref 70–99)
Glucose-Capillary: 173 mg/dL — ABNORMAL HIGH (ref 70–99)
Glucose-Capillary: 222 mg/dL — ABNORMAL HIGH (ref 70–99)

## 2018-09-19 MED ORDER — GABAPENTIN 300 MG PO CAPS
300.0000 mg | ORAL_CAPSULE | Freq: Every day | ORAL | Status: DC
  Administered 2018-09-19 – 2018-09-21 (×3): 300 mg via ORAL
  Filled 2018-09-19 (×3): qty 1

## 2018-09-19 MED ORDER — CHLORHEXIDINE GLUCONATE CLOTH 2 % EX PADS
6.0000 | MEDICATED_PAD | Freq: Every day | CUTANEOUS | Status: DC
  Administered 2018-09-19 – 2018-09-21 (×3): 6 via TOPICAL

## 2018-09-19 MED ORDER — INSULIN GLARGINE 100 UNIT/ML ~~LOC~~ SOLN
38.0000 [IU] | Freq: Every day | SUBCUTANEOUS | Status: DC
  Administered 2018-09-19 – 2018-09-20 (×2): 38 [IU] via SUBCUTANEOUS
  Filled 2018-09-19 (×3): qty 0.38

## 2018-09-19 MED ORDER — ALBUTEROL SULFATE (2.5 MG/3ML) 0.083% IN NEBU
2.5000 mg | INHALATION_SOLUTION | Freq: Two times a day (BID) | RESPIRATORY_TRACT | Status: DC
  Administered 2018-09-19 – 2018-09-20 (×2): 2.5 mg via RESPIRATORY_TRACT
  Filled 2018-09-19 (×2): qty 3

## 2018-09-19 MED ORDER — MUPIROCIN 2 % EX OINT
1.0000 "application " | TOPICAL_OINTMENT | Freq: Two times a day (BID) | CUTANEOUS | Status: DC
  Administered 2018-09-19 – 2018-09-21 (×5): 1 via NASAL
  Filled 2018-09-19 (×2): qty 22

## 2018-09-19 NOTE — Consult Note (Addendum)
Advanced Heart Failure Team Consult Note   Primary Physician: Derinda Late, MD PCP-Cardiologist:  Larae Grooms, MD  HF MD: Dr [name]   Reason for Consultation: Heart Failure   HPI:    [name] [name] is seen today for evaluation of heart failure and hypotension at the request of Dr Meda Coffee.   [name] [name] is a 73 year old with history of CAD, CABG, combined diastolic/systolic hf, CVA, DM, HTN, Hyperlipidemia, OSA, obesity, and PNA in 2020.   Admitted earlier this month with slurred speech, dizziness, fevers and cough. Treated for PNA and A/C HF exacerbation. Discharged on 2.5 mg metolazone daily + torsemide 20 mg every other day.  Discharged to CIR on 8/12. Unfortunately on 8/15 she developed hypotension and dizziness. Given IV fluids and diuretics stopped.  Due to ongoing symptomatic hypotension she was transferred back to acute care on  8/17. Diuretics have been held due to AKI. Received 3 liters IV fluids. She later developed bradycardia with heart rate in the 40s and BP 76/40. Placed on dopamine drip and moved to ICU.Marland Kitchen Creatinine had gone up > 3., procalcitonin < 0.10, WBC 17.8 . She also had slurred speech and had CT head. CT head was negative for acute issues.   Today dopamine has been stopped. She remains off diuretics. CVP 3-4. SBP > 100  Echo 09/17/2018: EF 45-50% RV moderately reduced.   Review of Systems: [y] = yes, [ ]  = no   . General: Weight gain [ ] ; Weight loss [ ] ; Anorexia [ ] ; Fatigue [Y ]; Fever [ ] ; Chills [ ] ; Weakness [Y ]  . Cardiac: Chest pain/pressure [ ] ; Resting SOB [ ] ; Exertional SOB [ ] ; Orthopnea [ ] ; Pedal Edema [ ] ; Palpitations [ ] ; Syncope [ ] ; Presyncope [ ] ; Paroxysmal nocturnal dyspnea[ ]   . Pulmonary: Cough [ ] ; Wheezing[ ] ; Hemoptysis[ ] ; Sputum [ ] ; Snoring [ ]   . GI: Vomiting[ ] ; Dysphagia[ ] ; Melena[ ] ; Hematochezia [ ] ; Heartburn[ ] ; Abdominal pain [ ] ; Constipation [ ] ; Diarrhea [ ] ; BRBPR [ ]   . GU: Hematuria[ ] ; Dysuria [ ] ; Nocturia[ ]    . Vascular: Pain in legs with walking [ ] ; Pain in feet with lying flat [ ] ; Non-healing sores [ ] ; Stroke [Y ]; TIA [ ] ; Slurred speech [Y];  . Neuro: Headaches[ ] ; Vertigo[ ] ; Seizures[ ] ; Paresthesias[ ] ;Blurred vision [ ] ; Diplopia [ ] ; Vision changes [ ]   . Ortho/Skin: Arthritis [ Y]; Joint pain [Y ]; Muscle pain [ ] ; Joint swelling [ ] ; Back Pain [Y ]; Rash [ ]   . Psych: Depression[ ] ; Anxiety[ ]   . Heme: Bleeding problems [ ] ; Clotting disorders [ ] ; Anemia [ ]   . Endocrine: Diabetes [Y ]; Thyroid dysfunction[ ]   Home Medications Prior to Admission medications   Medication Sig Start Date End Date Taking? Authorizing Provider  ADVAIR HFA 115-21 MCG/ACT inhaler Inhale 2 puffs into the lungs. 08/02/18   [provider]  albuterol (VENTOLIN HFA) 108 (90 Base) MCG/ACT inhaler Inhale 2 puffs into the lungs every 6 (six) hours as needed for wheezing.    [provider]  Alirocumab (PRALUENT) 150 MG/ML SOAJ Inject 1 pen into the skin every 14 (fourteen) days. 08/18/18   Larey Dresser, MD  ALPRAZolam Duanne Moron) 0.5 MG tablet Take 1 tablet (0.5 mg total) by mouth 2 (two) times daily. 04/10/18   Cherene Altes, MD  amLODipine (NORVASC) 10 MG tablet Take 10 mg by mouth daily.  [provider]  apixaban (ELIQUIS) 5 MG TABS tablet Take 1 tablet (5 mg total) by mouth 2 (two) times daily. 05/12/18   Larey Dresser, MD  bisacodyl (DULCOLAX) 5 MG EC tablet Take 1 tablet (5 mg total) by mouth daily as needed for moderate constipation. 05/19/18 05/19/19  Nicholes Stairs, MD  budesonide (PULMICORT) 0.25 MG/2ML nebulizer solution Take 2 mLs (0.25 mg total) by nebulization 2 (two) times daily. 04/10/18   Cherene Altes, MD  buPROPion (WELLBUTRIN XL) 150 MG 24 hr tablet Take 150 mg by mouth daily.    [provider]  clopidogrel (PLAVIX) 75 MG tablet Take 1 tablet (75 mg total) by mouth at bedtime. 05/12/18   Larey Dresser, MD  denosumab (PROLIA) 60 MG/ML SOSY  injection Inject 69 mg into the skin every 6 (six) months.    [provider]  DULoxetine (CYMBALTA) 30 MG capsule Take 1 capsule (30 mg total) by mouth daily. 04/11/18   Cherene Altes, MD  ezetimibe (ZETIA) 10 MG tablet Take 1 tablet (10 mg total) by mouth at bedtime. 04/10/18   Cherene Altes, MD  gabapentin (NEURONTIN) 300 MG capsule Take 2 capsules (600 mg total) by mouth 2 (two) times daily. 04/10/18   Cherene Altes, MD  insulin aspart (NOVOLOG) 100 UNIT/ML injection Inject 8 Units into the skin 3 (three) times daily with meals. Patient taking differently: Inject 20 Units into the skin 3 (three) times daily.  04/10/18   Cherene Altes, MD  insulin glargine (LANTUS) 100 UNIT/ML injection Inject 0.35 mLs (35 Units total) into the skin at bedtime. Patient taking differently: Inject 38 Units into the skin at bedtime.  04/10/18   Cherene Altes, MD  ipratropium-albuterol (DUONEB) 0.5-2.5 (3) MG/3ML SOLN Take 3 mLs by nebulization 2 (two) times daily. Patient taking differently: Take 3 mLs by nebulization 2 (two) times daily as needed (shortness of breath).  04/10/18   Cherene Altes, MD  metolazone (ZAROXOLYN) 2.5 MG tablet Take 2.5 mg by mouth daily.    [provider]  metoprolol succinate (TOPROL-XL) 50 MG 24 hr tablet Take 25mg  daily with or immediately following a meal. 08/29/18   Larey Dresser, MD  Multiple Vitamins-Minerals (HEALTHY EYES/LUTEIN) TABS Take 1 tablet by mouth daily.    [provider]  nitroGLYCERIN (NITROSTAT) 0.3 MG SL tablet Place 1 tablet (0.3 mg total) under the tongue every 5 (five) minutes x 3 doses as needed for chest pain. 09/15/17   Georgiana Shore, NP  ondansetron (ZOFRAN) 4 MG tablet Take 4 mg by mouth every 6 (six) hours as needed for nausea/vomiting.    [provider]  oxyCODONE (OXY IR/ROXICODONE) 5 MG immediate release tablet Take 1 tablet (5 mg total) by mouth every 4 (four) hours as needed for moderate pain or  severe pain. Patient not taking: Reported on 09/09/2018 05/19/18   Nicholes Stairs, MD  OXYGEN Inhale 2 L into the lungs at bedtime.     [provider]  pantoprazole (PROTONIX) 40 MG tablet Take 1 tablet (40 mg total) by mouth daily. 04/11/18   Cherene Altes, MD  potassium chloride SA (K-DUR) 20 MEQ tablet TAKE ONE (1) TABLET BY MOUTH TWO (2) TIMES DAILY 09/12/18   Larey Dresser, MD  ranolazine (RANEXA) 1000 MG SR tablet Take 1,000 mg by mouth every 12 (twelve) hours.    [provider]  rosuvastatin (CRESTOR) 40 MG tablet Take 1 tablet (40 mg  total) by mouth at bedtime. 04/10/18   Cherene Altes, MD  spironolactone (ALDACTONE) 25 MG tablet Take 1 tablet (25 mg total) by mouth daily. Patient not taking: Reported on 09/09/2018 04/11/18   Cherene Altes, MD  torsemide (DEMADEX) 20 MG tablet Take 1 tablet (20 mg total) by mouth every other day. 06/02/18   Georgiana Shore, NP  traMADol (ULTRAM) 50 MG tablet Take 1 tablet (50 mg total) by mouth every 6 (six) hours as needed for moderate pain. Patient not taking: Reported on 09/09/2018 05/19/18   Nicholes Stairs, MD  triamcinolone cream (KENALOG) 0.1 % Apply 1 application topically daily. 08/02/18   [provider]  vitamin B-12 (CYANOCOBALAMIN) 1000 MCG tablet Take 1,000 mcg by mouth daily.    [provider]  zolpidem (AMBIEN) 10 MG tablet Take 10 mg by mouth every evening.    [provider]    Past Medical History: Past Medical History:  Diagnosis Date  . Abnormality of gait 05/14/2014  . Anemia    hx  . Anginal pain ([place])   . Anxiety   . Asthma   . Basal cell carcinoma 05/2014   "left shoulder"  . Bundle branch block, left    chronic/notes 07/18/2013  . CHF (congestive heart failure) ([place])   . Chronic bronchitis ([place])    "off and on all the time" (07/18/2013)  . Chronic insomnia 05/06/2015  . Chronic kidney disease    frequency, sees dr Jamal Maes every 4 to 6 months  (01/16/2018)  . Chronic low back pain 08/24/2016  . Chronic lower back pain   . Claustrophobia   . Common migraine 05/14/2014  . Coronary artery disease   . Depression   . Dysrhythmia   . GERD (gastroesophageal reflux disease)   . H/O hiatal hernia   . Headache    "at least 2/month" (01/16/2018)  . Heart murmur   . Hyperlipidemia   . Hypertension   . Irregular heart beat   . Leg cramps    both legs at times  . Melanoma ([place]) 05/2014   "burned off BLE" (01/16/2018)  . Memory change 05/14/2014  . Migraine    "5-6/year"  (01/16/2018)  . Myocardial infarction ([place]) 04/1999, 02/2000, 01/2005; 2011; 2014  . Neuromuscular disorder ([place])    ?  . Obesity 01-2010  . Obstructive sleep apnea    "can't wear machine; I have claustrophobia" (01/16/2018), states she had a 2nd sleep study and does not have sleep apnea, her O2 decreases and now is on 2 L of O2 at night.  . On home oxygen therapy    "2L at night and prn during daytime" (01/16/2018)  . Osteoarthritis    "knees and hands" (01/16/2018)  . Other and unspecified angina pectoris   . Peripheral vascular disease ([facility])    ? numbness, tingling arms and legs  . Pneumonia 2000's   "once"  . PONV (postoperative nausea and vomiting)   . Shortness of breath    with exertion  . Stroke ([place]) 03/22/12   right side brain; denies residual on 07/18/2013)  . Stroke [facility]) Oct. 2015   Affected pt.s balance  . Stroke [facility]) 10/2014   "affected my legs; fully recovered now"; still have sporatic memory issues from this one" (01/16/2018)  . Type II diabetes mellitus ([place])   . Ventral hernia    hx of  . Vomiting    persistent  . Vomiting blood     Past Surgical History: Past  Surgical History:  Procedure Laterality Date  . ANKLE FRACTURE SURGERY Left 1970's  . APPENDECTOMY  1970's   w/hysterectomy  . BASAL CELL CARCINOMA EXCISION Left 05/2014   "shoulder" (01/16/2018)  . CARDIAC CATHETERIZATION  10/10/2012   Dr [name].  Marland Kitchen CARDIAC  CATHETERIZATION N/A 05/29/2015   Procedure: Right/Left Heart Cath and Coronary/Graft Angiography;  Surgeon: Larey Dresser, MD;  Location: [facility] CV LAB;  Service: Cardiovascular;  Laterality: N/A;  . CAROTID ENDARTERECTOMY Left 03/2013  . CAROTID STENT INSERTION Left 03/20/2013   Procedure: CAROTID STENT INSERTION;  Surgeon: [name], MD;  Location: [facility] CATH LAB;  Service: Cardiovascular;  Laterality: Left;  internal carotid  . CEREBRAL ANGIOGRAM N/A 04/05/2011   Procedure: CEREBRAL ANGIOGRAM;  Surgeon: Angelia Mould, MD;  Location: [facility] CATH LAB;  Service: Cardiovascular;  Laterality: N/A;  . CHOLECYSTECTOMY OPEN  2004  . CORONARY ANGIOPLASTY WITH STENT PLACEMENT  01,02,05,06,07,08,11; 04/24/2013   "I've probably got ~ 10 stents by now" (04/24/2013)  . CORONARY ANGIOPLASTY WITH STENT PLACEMENT  06/13/2013   "got 4 stents today" (06/13/2013)  . CORONARY ARTERY BYPASS GRAFT  1220/11   "CABG X5"  . CORONARY STENT INTERVENTION N/A 10/20/2017   Procedure: CORONARY STENT INTERVENTION;  Surgeon: Troy Sine, MD;  Location: [place] CV LAB;  Service: Cardiovascular;  Laterality: N/A;  . ESOPHAGOGASTRODUODENOSCOPY  08/03/2011   Procedure: ESOPHAGOGASTRODUODENOSCOPY (EGD);  Surgeon: Shann Medal, MD;  Location: [facility];  Service: General;  Laterality: N/A;  . ESOPHAGOGASTRODUODENOSCOPY (EGD) WITH PROPOFOL N/A 03/11/2014   Procedure: ESOPHAGOGASTRODUODENOSCOPY (EGD) WITH PROPOFOL;  Surgeon: Lafayette Dragon, MD;  Location: [facility] ENDOSCOPY;  Service: Endoscopy;  Laterality: N/A;  . FRACTURE SURGERY    . gall stone removal  05/2003  . GASTRIC RESTRICTION SURGERY  1984   "stapeling"  . HERNIA REPAIR  2004   "in my stomach; had OR on it twice", wire mesh on 1 hernia  . LEFT HEART CATH AND CORS/GRAFTS ANGIOGRAPHY N/A 07/09/2016   Procedure: LEFT HEART CATH AND CORS/GRAFTS ANGIOGRAPHY;  Surgeon: Larey Dresser, MD;  Location: [facility] CV LAB;  Service: Cardiovascular;  Laterality: N/A;  .  ORIF ANKLE FRACTURE Left 05/16/2018  . ORIF ANKLE FRACTURE Left 05/16/2018   Procedure: OPEN REDUCTION INTERNAL FIXATION (ORIF) Left ankle with possible syndesmosis fixation;  Surgeon: Nicholes Stairs, MD;  Location: [facility];  Service: Orthopedics;  Laterality: Left;  166min  . OVARY SURGERY  1970's   "tumor removed"  . PERCUTANEOUS CORONARY STENT INTERVENTION (PCI-S) N/A 06/13/2013   Procedure: PERCUTANEOUS CORONARY STENT INTERVENTION (PCI-S);  Surgeon: [name], MD;  Location: [facility] CATH LAB;  Service: Cardiovascular;  Laterality: N/A;  . PERCUTANEOUS STENT INTERVENTION N/A 04/24/2013   Procedure: PERCUTANEOUS STENT INTERVENTION;  Surgeon: [name], MD;  Location: [facility] CATH LAB;  Service: Cardiovascular;  Laterality: N/A;  . RIGHT/LEFT HEART CATH AND CORONARY/GRAFT ANGIOGRAPHY N/A 10/20/2017   Procedure: RIGHT/LEFT HEART CATH AND CORONARY/GRAFT ANGIOGRAPHY;  Surgeon: Larey Dresser, MD;  Location: [place] CV LAB;  Service: Cardiovascular;  Laterality: N/A;  . ROOT CANAL  10/2000  . TIBIA FRACTURE SURGERY Right 1970's   rods and pins  . TOOTH EXTRACTION     "1 on the upper; wisdom tooth on the lower" (01/16/2018)  . TOTAL ABDOMINAL HYSTERECTOMY  1970's   w/ appendectomy    Family History: Family History  Problem Relation Age of Onset  . Heart disease Father        Heart Disease  before age 51  . Diabetes Father   . Hyperlipidemia Father   . Hypertension Father   . Heart attack Father   . Deep vein thrombosis Father   . AAA (abdominal aortic aneurysm) Father   . Hypertension Mother   . Deep vein thrombosis Mother   . AAA (abdominal aortic aneurysm) Mother   . Cancer Sister        Ovarian  . Hypertension Sister   . Diabetes Paternal Grandmother   . Heart disease Paternal Uncle   . Diabetes Paternal Uncle   . Heart attack Paternal Grandfather 73       died of MI at 38  . Diabetes Paternal Aunt   . Diabetes Paternal Uncle   . Colon cancer Neg Hx      Social History: Social History   Socioeconomic History  . Marital status: Married    Spouse name: [name]   . Number of children: 1  . Years of education: 12+  . Highest education level: Not on file  Occupational History  . Occupation: retired  Scientific laboratory technician  . Financial resource strain: Not on file  . Food insecurity    Worry: Not on file    Inability: Not on file  . Transportation needs    Medical: Not on file    Non-medical: Not on file  Tobacco Use  . Smoking status: Never Smoker  . Smokeless tobacco: Never Used  Substance and Sexual Activity  . Alcohol use: Never    Frequency: Never  . Drug use: Never  . Sexual activity: Not Currently    Birth control/protection: Surgical    Comment: hysterectomy  Lifestyle  . Physical activity    Days per week: Not on file    Minutes per session: Not on file  . Stress: Not on file  Relationships  . Social Herbalist on phone: Not on file    Gets together: Not on file    Attends religious service: Not on file    Active member of club or organization: Not on file    Attends meetings of clubs or organizations: Not on file    Relationship status: Not on file  Other Topics Concern  . Not on file  Social History Narrative   Patient lives at home with husband [name].    Patient has 1 child.    Patient is not currently working.    Patient has 2 years of college.    Patient is right handed.   Patient drinks caffeine occasionally.    Allergies:  Allergies  Allergen Reactions  . Amoxicillin Shortness Of Breath and Rash  . Brilinta [Ticagrelor] Shortness Of Breath  . Erythromycin Shortness Of Breath, Other (See Comments) and Hives    Trouble swallowing  . Flagyl [Metronidazole] Shortness Of Breath and Palpitations  . Penicillins Hives, Shortness Of Breath, Rash and Other (See Comments)    Has patient had a PCN reaction causing immediate rash, facial/tongue/throat swelling, SOB or lightheadedness with hypotension: Yes  Has patient had a PCN reaction causing severe rash involving mucus membranes or skin necrosis: No Has patient had a PCN reaction that required hospitalization: Yes Has patient had a PCN reaction occurring within the last 10 years: No If all of the above answers are "NO", then may proceed with Cephalosporin use.   . Isosorbide Mononitrate [Isosorbide Nitrate] Other (See Comments)    Joint aches, muscles hurt, difficult to walk   . Jardiance [Empagliflozin] Other (See Comments)  Nausea, joint aches, muscles aches  . Metformin And Related Other (See Comments)    Stomach pain, cold sweats, joint pain, burred vision, dizziness  . Tape Other (See Comments)    Skin pulls off with certain types Plastic tape causes skin to rip if left on for long periods of time  . Erythromycin Base Rash    Objective:    Vital Signs:   Temp:  [97.6 F (36.4 C)-98.2 F (36.8 C)] 98.2 F (36.8 C) (08/18 0730) Pulse Rate:  [25-69] 61 (08/18 0730) Resp:  [11-26] 14 (08/18 0730) BP: (80-142)/(31-112) 107/96 (08/18 0730) SpO2:  [85 %-100 %] 100 % (08/18 0752) Last BM Date: 09/17/18  Weight change: Filed Weights   09/18/18 0400  Weight: 108 kg    Intake/Output:   Intake/Output Summary (Last 24 hours) at 09/19/2018 0828 Last data filed at 09/19/2018 0700 Gross per 24 hour  Intake 1127.75 ml  Output 1000 ml  Net 127.75 ml      Physical Exam   CVP 3-4  General:  No resp difficulty HEENT: normal Neck: supple. JVP flat . Carotids 2+ bilat; no bruits. No lymphadenopathy or thyromegaly appreciated. Cor: PMI nondisplaced. Regular rate & rhythm. No rubs, gallops or murmurs. Lungs: clear Abdomen: obese, soft, nontender, nondistended. No hepatosplenomegaly. No bruits or masses. Good bowel sounds. Extremities: no cyanosis, clubbing, rash, edema Neuro: alert & orientedx3, cranial nerves grossly intact. moves all 4 extremities w/o difficulty. Affect pleasant   Telemetry   NSR 50-60s No pauses  Personally reviewed  EKG    Junctional Bradycardia 53 bpm. LBBB 190 [name] Personally reviewed   Labs   Basic Metabolic Panel: Recent Labs  Lab 09/15/18 0624 09/17/18 1009 09/18/18 0023 09/18/18 1151 09/19/18 0342  NA 133* 131* 130* 133* 133*  K 3.1* 3.8 4.0 4.3 3.7  CL 81* 89* 93* 97* 96*  CO2 36* 29 27 21* 27  GLUCOSE 184* 272* 181* 204* 191*  BUN 29* 31* 43* 46* 47*  CREATININE 1.68* 1.87* 3.14* 3.39* 2.99*  CALCIUM 9.5 9.1 8.3* 8.8* 9.0    Liver Function Tests: Recent Labs  Lab 09/14/18 0538 09/18/18 0023  AST 40 23  ALT 32 19  ALKPHOS 104 89  BILITOT 1.4* 0.8  PROT 6.9 5.1*  ALBUMIN 2.9* 2.5*   No results for input(s): LIPASE, AMYLASE in the last 168 hours. No results for input(s): AMMONIA in the last 168 hours.  CBC: Recent Labs  Lab 09/13/18 0317 09/14/18 0538 09/18/18 0023 09/18/18 1241 09/19/18 0342  WBC 10.9* 11.3* 13.5* 17.8* 12.8*  NEUTROABS  --  8.8*  --   --   --   HGB 10.9* 12.3 10.4* 11.7* 10.5*  HCT 33.9* 38.6 32.5* 36.7 32.5*  MCV 89.0 87.9 88.1 88.0 87.6  PLT 191 196 204 226 210    Cardiac Enzymes: No results for input(s): CKTOTAL, CKMB, CKMBINDEX, TROPONINI in the last 168 hours.  BNP: BNP (last 3 results) Recent Labs    04/10/18 0512 09/10/18 0925 09/18/18 0627  BNP 149.9* >4,500.0* 226.7*    ProBNP (last 3 results) No results for input(s): PROBNP in the last 8760 hours.   CBG: Recent Labs  Lab 09/18/18 0738 09/18/18 1108 09/18/18 1531 09/18/18 2131 09/19/18 0735  GLUCAP 181* 204* 195* 218* 159*    Coagulation Studies: No results for input(s): LABPROT, INR in the last 72 hours.   Imaging   Dg Chest Port 1 View  Result Date: 09/18/2018 CLINICAL DATA:  Central line placement EXAM: PORTABLE CHEST  1 VIEW COMPARISON:  09/18/2018 at 0153 hours FINDINGS: Left IJ venous catheter terminates at the cavoatrial junction. Pulmonary vascular congestion without frank interstitial edema. No pleural effusion or  pneumothorax. Cardiomegaly.  Postsurgical changes related to prior CABG. Median sternotomy. IMPRESSION: Left IJ venous catheter terminates at the cavoatrial junction. Electronically Signed   By: Julian Hy M.D.   On: 09/18/2018 18:03      Medications:     Current Medications: . albuterol  2.5 mg Nebulization TID  . apixaban  5 mg Oral BID  . budesonide (PULMICORT) nebulizer solution  0.5 mg Nebulization BID  . buPROPion  150 mg Oral Daily  . Chlorhexidine Gluconate Cloth  6 each Topical Daily  . clopidogrel  75 mg Oral Daily  . dextromethorphan-guaiFENesin  1 tablet Oral BID  . DULoxetine  30 mg Oral Daily  . gabapentin  300 mg Oral Daily  . insulin aspart  0-15 Units Subcutaneous TID WC  . insulin glargine  32 Units Subcutaneous QHS  . mouth rinse  15 mL Mouth Rinse BID  . pantoprazole  40 mg Oral Daily  . rosuvastatin  40 mg Oral q1800  . sodium chloride flush  10-40 mL Intracatheter Q12H     Infusions: . sodium chloride 10 mL/hr at 09/19/18 0700        Assessment/Plan   1. Hypotension/ +/- sepsis WBC up to 17.8> 13. Blood CX NGTD Over diuresis playing a role. Continue to hold diuretics for now.   2. Bradycardia Was on dopamine with improvement. Now off. Resolved.   3. A/C Diastolic/Systolic HF Appears dry. CVP 3. Hold diuretics for now.  No bb for now. No arb, spiro with elevated creatinine.   4. AKI/ CKD Stage III Renal function has been labile.Baseline likely 1.5-1.8 Peaked 3.3> 2.9  Diuretics on hold   5. PAF In NSR today. On eliquis 5 mg twice a day . Continue   6. CAD  S/P CABG.  -Occluded native RCA and SVG-RCA. Status post opening of CTO RCA with 4 overlapping Xience DES in 5/15.DES to sequential SVG-OM1/PLOM in 9/19.  9/2020S/P DES .  -No chest pain. Continue plavix 75 mg + crestor 40 mg daily + elqiuis.    7. OSA Intolerant CPAP   8. AMS CT of head negative 8/17. Resolved.      Length of Stay: 1  Amy Clegg, NP  09/19/2018,  8:28 AM  Advanced Heart Failure Team Pager [phone] (M-F; 7a - 4p)  Please contact [facility] for night-coverage after hours (4p -7a ) and weekends on [email or web address]  Patient seen and examined with the above-signed Advanced Practice Provider and/or Housestaff. I personally reviewed laboratory data, imaging studies and relevant notes. I independently examined the patient and formulated the important aspects of the plan. I have edited the note to reflect any of my changes or salient points. I have personally discussed the plan with the patient and/or family.   BP and HR down yesterday but now much improved with holding diuretics. Now off dopamine. CVP 3 (measured personally). No further bradycardia. No pauses on tele. Creatinine starting to come down.   General:  Sitting up in bed HEENT: normal Neck: supple. no JVD. Carotids 2+ bilat; no bruits. No lymphadenopathy or thryomegaly appreciated. Cor: PMI nondisplaced. Regular rate & rhythm. No rubs, gallops or murmurs. Lungs: clear Abdomen: obese soft, nontender, nondistended. No hepatosplenomegaly. No bruits or masses. Good bowel sounds. Extremities: no cyanosis, clubbing, rash, edema Neuro: alert & orientedx3, cranial nerves grossly intact.  moves all 4 extremities w/o difficulty. Affect pleasant  Suspect main issue is volume depletion/overdiuresis. CVP remains low. Continue to hold diuretics. Can add midodrine as needed. Renal function improving. Will continue to watch closely.   Glori Bickers, MD  2:05 PM

## 2018-09-19 NOTE — Progress Notes (Signed)
NAME:  [name] [name], [name]:  [id number], DOB:  February 15, 1945, LOS: 1 ADMISSION DATE:  09/18/2018    Brief History   73 y.o. F transferred from rehab for persistent bradycardia and hypotension.  History of present illness   Patient is a 73 year old female with significant  PMH of CAD, CHF, DM, Atrial fibrillation and CVA who has been admitted several times this year. First in 03/2018 for respiratory failure 2/2 HFrEF and PNA, was discharged to rehab and then fell sustaining fx of the L ankle requiring re-admission in 05/2018.   Most recently, was admitted on 09/09/2018 after a fall with slurred speech and was evaluated for stroke, no acute infarct on MRI.  She was discharged to rehab on 8/12.   She was felt to be volume overloaded and aggressively diuresed with Demadex, Spironolactone and Metolazone and maintained on Losartan, Metoprolol and Amlodipine for HTN.    Over the last 18 hours she has become progressively bradycardic and hypotensive. Cardiology was consulted and discontinued diuretics.   Echocardiogram performed yesterday shows improvement in ejection fraction from 25 to 30% up to 45 to 50%  She also  had elevation in her creatinine to 3.14 (baseline 1.6-1.7) .   A rapid response was called for symptomatic BP of 75/40, afebrile.  She was given 3L IVF yesterday and BB held (last given evening of 8/16), Dopamine was initiated and she was transferred to the ICU.  In the early morning hours of 8/17 she had some slurred speech and a code stroke was called, CT head negative and no further work-up per neurology.  She required one day of dopamine and then was able to d/c.  Pt otherwise stable   Past Medical History  CAD s/p CABG, HFrEF, CVA, Type 2 DM, Atrial Fibrillation on Eliquis   Significant Hospital Events   8/8-Admitted to hospitalist service 8/12-D/c to inpatient rehab 8/17-Rapid response for hypotension and bradycardia and transfer to ICU  Consults:  8/15 Cardiology 8/17 PCCM   Procedures:  L IJ central line 8/17-  Significant Diagnostic Tests:  8/8 CT head>> negative for acute CVA 8/9 MR/MRA head>>multiple areas of hyperintense T2-weighted signal in the bilateral supratentorial white matter, likely subacute to chronic infarcts, severe intracranial atherosclerosis, severe stenosis of both internal carotid arteries 8/16 Echo>>EF 45-50%, mild LVH and diastolic dysfunction 3/30 Renal US>>normal 8/17 CT head>>atrophy with chronic ischemic changes deep cerebral white matter, no acute CVA   Micro Data:  8/17 Blood culture x2>>  Antimicrobials:  Cefepime>>8/8-8/12  Levofloxacin>>8/12-8/15 Vancomycin>>8/8-8/11  Interim history/subjective:  No overnight events, Dopamine down to 45mcg/min, pt much more awake and well-appearing this morning.  She complains of chronic LE neuropathy and asks for neurontin to be resumed  Objective   Blood pressure (!) 85/71, pulse 64, temperature 98.2 F (36.8 C), temperature source Oral, resp. rate 17, weight 108 kg, SpO2 100 %.        Intake/Output Summary (Last 24 hours) at 09/19/2018 0941 Last data filed at 09/19/2018 0900 Gross per 24 hour  Intake 1234.34 ml  Output 1400 ml  Net -165.66 ml   Filed Weights   09/18/18 0400  Weight: 108 kg   General:  Elderly F, generally well-appearing and in no distress  HEENT: MM pink/moist Neuro: Alert and oriented x3, moving all extremities CV: s1s2, regular rate and rhythm, no m/r/g PULM:  CTAB, no increased WOB GI: soft, bsx4 active  Extremities: warm/dry, 1+ edema  Skin: no rashes or lesions    Resolved Hospital Problem list  Hypotension  Assessment & Plan:   Hypotension and Bradycardia likely secondary to over-diuresis and beta blocker in the setting of AKI -SBP 110 on 4 mcg/min Dopamine, HR 60's -Do not suspect contributing infection, leukocytosis improving, afebrile and UA/CXR clear -Troponin negative  P: -d/c Dopamine, monitor BP and if stable she can likely be  transferred telemetry -Continue holding diuretics and BP meds for now -Leave central line in place, if stable can be removed this afternoon or tomorrow    History of CVA, intermittent somnolence and slurred speech, recent falls -CT head negative  -Appears to be at neurologic baseline P: -No further w/u per neurology  -Suspect polypharmacy contributing, home meds include Xanax, Ambien, Tramadol, Neurontin, Wellbutrin, Cymbalta,  -Holding Ambien and Tramadol, consider decreasing high risk medications given recent falls -Re-consult PT   CAD, HTN, HL Systolic and Diastolic HF and history of atrial fibrillation -Echo 8/16 with improved EF of 16-60% and diastolic dysfunction -Cardiology following, appreciate recommendations P: -Holding Norvasc, diuretics and BB secondary to hypotension and bradycardia for at least another 24hrs -Continue Crestor, Plavix and Eliquis   Acute Kidney Injury -Likely secondary to over-diuresis -Creatine slightly improved to 2.99 -Renal US normal -UOP ~1469ml over 48hrs P: -monitor UOP and BMET   Type 2 DM P: -Continue Lantus 36units qhs, SSI and diabetic diet     Best practice:  Diet: diabetic Pain/Anxiety/Delirium protocol (if indicated): n/a VAP protocol (if indicated): n/a DVT prophylaxis: Eliquis GI prophylaxis: Protonix Glucose control: SSI, Lantus Mobility: with assist Code Status: Full  Family Communication:  Disposition: telemetry  Labs   CBC: Recent Labs  Lab 09/13/18 0317 09/14/18 0538 09/18/18 0023 09/18/18 1241 09/19/18 0342  WBC 10.9* 11.3* 13.5* 17.8* 12.8*  NEUTROABS  --  8.8*  --   --   --   HGB 10.9* 12.3 10.4* 11.7* 10.5*  HCT 33.9* 38.6 32.5* 36.7 32.5*  MCV 89.0 87.9 88.1 88.0 87.6  PLT 191 196 204 226 630    Basic Metabolic Panel: Recent Labs  Lab 09/15/18 0624 09/17/18 1009 09/18/18 0023 09/18/18 1151 09/19/18 0342  NA 133* 131* 130* 133* 133*  K 3.1* 3.8 4.0 4.3 3.7  CL 81* 89* 93* 97* 96*   CO2 36* 29 27 21* 27  GLUCOSE 184* 272* 181* 204* 191*  BUN 29* 31* 43* 46* 47*  CREATININE 1.68* 1.87* 3.14* 3.39* 2.99*  CALCIUM 9.5 9.1 8.3* 8.8* 9.0   GFR: Estimated Creatinine Clearance: 20.5 mL/min (A) (by C-G formula based on SCr of 2.99 mg/dL (H)). Recent Labs  Lab 09/14/18 0538 09/18/18 0023 09/18/18 1151 09/18/18 1241 09/19/18 0342  PROCALCITON  --   --  <0.10  --   --   WBC 11.3* 13.5*  --  17.8* 12.8*  LATICACIDVEN  --  1.6  --   --   --     Liver Function Tests: Recent Labs  Lab 09/14/18 0538 09/18/18 0023  AST 40 23  ALT 32 19  ALKPHOS 104 89  BILITOT 1.4* 0.8  PROT 6.9 5.1*  ALBUMIN 2.9* 2.5*   No results for input(s): LIPASE, AMYLASE in the last 168 hours. No results for input(s): AMMONIA in the last 168 hours.  ABG    Component Value Date/Time   PHART 7.461 (H) 09/18/2018 0305   PCO2ART 39.9 09/18/2018 0305   PO2ART 68.2 (L) 09/18/2018 0305   HCO3 28.1 (H) 09/18/2018 0305   TCO2 >50 (H) 04/01/2018 1648   ACIDBASEDEF 2.0 01/28/2010 1651   O2SAT 93.0 09/18/2018  5427     Coagulation Profile: No results for input(s): INR, PROTIME in the last 168 hours.  Cardiac Enzymes: No results for input(s): CKTOTAL, CKMB, CKMBINDEX, TROPONINI in the last 168 hours.  HbA1C: Hgb A1c MFr Bld  Date/Time Value Ref Range Status  09/10/2018 09:36 AM 7.0 (H) 4.8 - 5.6 % Final    Comment:    (NOTE) Pre diabetes:          5.7%-6.4% Diabetes:              >6.4% Glycemic control for   <7.0% adults with diabetes   05/16/2018 06:23 AM 6.9 (H) 4.8 - 5.6 % Final    Comment:    (NOTE)         Prediabetes: 5.7 - 6.4         Diabetes: >6.4         Glycemic control for adults with diabetes: <7.0     CBG: Recent Labs  Lab 09/18/18 0738 09/18/18 1108 09/18/18 1531 09/18/18 2131 09/19/18 0735  GLUCAP 181* 204* 195* 218* 159*     CRITICAL CARE Performed by: Otilio Carpen Lazariah Savard   Total critical care time: 33 minutes  Critical care time was exclusive of  separately billable procedures and treating other patients.  Critical care was necessary to treat or prevent imminent or life-threatening deterioration secondary to hypotension requiring vaspressors and acute renal injury.  Critical care was time spent personally by me on the following activities: development of treatment plan with patient and/or surrogate as well as nursing, discussions with consultants, evaluation of patient's response to treatment, examination of patient, obtaining history from patient or surrogate, ordering and performing treatments and interventions, ordering and review of laboratory studies, ordering and review of radiographic studies, pulse oximetry and re-evaluation of patient's condition.   Otilio Carpen Anderson [place], PA-C [place]  Pager [phone]

## 2018-09-19 NOTE — Progress Notes (Signed)
ST changes noted, 12 lead-EKG taken. Finding-left bundle branch block. Enlink made aware. Will continue to assess.

## 2018-09-19 NOTE — Evaluation (Signed)
Physical Therapy Evaluation Patient Details Name: [name] [name] [name]: [id number] DOB: 1946/01/19 Today's Date: 09/19/2018   History of Present Illness  Pt readmitted from CIR with hypotension and bradycardia. Pt originally admitted 8/8 with slurred speech, falls, fever, dizziness, N/V and SOB. Found to have Acute systolic CHF, lobar pneumonia, acute respiratory failure and sepsis.Head CT-new area hypoattenuation in right frontal lobe. MRA-multiple areas of stenosis including severe b/l ICA stenosis. CXR-pulmonary edema.  PMH includes OA, CVA, MI, HTN, HLD, CKD, PVD, DM2, CHF, CAD, anxiety, ankle fx  Clinical Impression  Pt admitted with above diagnosis and presents to PT with functional limitations due to deficits listed below (See PT problem list). Pt needs skilled PT to maximize independence and safety to allow discharge back to CIR prior to return home with husband.      Follow Up Recommendations CIR    Equipment Recommendations  None recommended by PT    Recommendations for Other Services       Precautions / Restrictions Precautions Precautions: Fall Precaution Comments: hx of falls at home (~8 in last 6 months) Restrictions Weight Bearing Restrictions: No      Mobility  Bed Mobility Overal bed mobility: Needs Assistance Bed Mobility: Supine to Sit     Supine to sit: Min assist;HOB elevated     General bed mobility comments: Assist to elevate trunk into sitting and bring hips to EOB  Transfers Overall transfer level: Needs assistance Equipment used: Rolling walker (2 wheeled) Transfers: Sit to/from [facility] Sit to Stand: Min assist Stand pivot transfers: Min assist       General transfer comment: Assist to bring hips up and for balance. Pt with small pivotal steps with walker from bed to chair.   Ambulation/Gait             General Gait Details: did not attempt  Stairs            Wheelchair Mobility    Modified Rankin (Stroke  Patients Only)       Balance Overall balance assessment: Needs assistance;History of Falls Sitting-balance support: No upper extremity supported;Feet supported Sitting balance-Leahy Scale: Fair     Standing balance support: Bilateral upper extremity supported;During functional activity Standing balance-Leahy Scale: Poor Standing balance comment: walker and min assist for static standing. Initial posterior lean in standing                             Pertinent Vitals/Pain Pain Assessment: Faces Faces Pain Scale: Hurts a little bit Pain Location: ankles Pain Descriptors / Indicators: Sore Pain Intervention(s): Limited activity within patient's tolerance;Monitored during session    Home Living Family/patient expects to be discharged to:: Private residence Living Arrangements: Spouse/significant other Available Help at Discharge: Family(spouse and daughter) Type of Home: House Home Access: Stairs to enter Entrance Stairs-Rails: None Entrance Stairs-Number of Steps: 1 Home Layout: Two level;1/2 bath on main level;Bed/bath upstairs Home Equipment: Walker - 2 wheels;Shower seat      Prior Function Level of Independence: Needs assistance   Gait / Transfers Assistance Needed: amb with rolling walker           Hand Dominance   Dominant Hand: Right    Extremity/Trunk Assessment   Upper Extremity Assessment Upper Extremity Assessment: Defer to OT evaluation    Lower Extremity Assessment Lower Extremity Assessment: Generalized weakness       Communication   Communication: No difficulties  Cognition Arousal/Alertness: Awake/alert Behavior During Therapy:  WFL for tasks assessed/performed Overall Cognitive Status: Within Functional Limits for tasks assessed                                        General Comments General comments (skin integrity, edema, etc.): BP stable throughout    Exercises     Assessment/Plan    PT Assessment  Patient needs continued PT services  PT Problem List Decreased strength;Decreased activity tolerance;Decreased balance;Decreased mobility;Obesity       PT Treatment Interventions DME instruction;Gait training;Functional mobility training;Therapeutic activities;Therapeutic exercise;Balance training;Patient/family education    PT Goals (Current goals can be found in the Care Plan section)  Acute Rehab PT Goals Patient Stated Goal: go home PT Goal Formulation: With patient Time For Goal Achievement: 10/03/18 Potential to Achieve Goals: Good    Frequency Min 3X/week   Barriers to discharge        Co-evaluation               AM-PAC PT "6 Clicks" Mobility  Outcome Measure Help needed turning from your back to your side while in a flat bed without using bedrails?: A Little Help needed moving from lying on your back to sitting on the side of a flat bed without using bedrails?: A Little Help needed moving to and from a bed to a chair (including a wheelchair)?: A Little Help needed standing up from a chair using your arms (e.g., wheelchair or bedside chair)?: A Little Help needed to walk in hospital room?: A Lot Help needed climbing 3-5 steps with a railing? : Total 6 Click Score: 15    End of Session Equipment Utilized During Treatment: Gait belt Activity Tolerance: Patient tolerated treatment well Patient left: in chair;with call bell/phone within reach;with chair alarm set Nurse Communication: Mobility status PT Visit Diagnosis: Other abnormalities of gait and mobility (R26.89);Unsteadiness on feet (R26.81);Muscle weakness (generalized) (M62.81);History of falling (Z91.81)    Time: 0630-1601 PT Time Calculation (min) (ACUTE ONLY): 27 min   Charges:   PT Evaluation $PT Eval Moderate Complexity: 1 Mod PT Treatments $Therapeutic Activity: 8-22 mins        [place] Pager [phone] Office [place] 09/19/2018, 1:57 PM

## 2018-09-19 NOTE — Progress Notes (Signed)
Inpatient Rehabilitation Admissions Coordinator  I met with patient at bedside. I await medical clearance to readmit pt to inpt rehab over the next few days. Patient is in agreement. I will follow.  Danne Baxter, RN, MSN Rehab Admissions Coordinator [phone] 09/19/2018 2:09 PM

## 2018-09-20 DIAGNOSIS — N179 Acute kidney failure, unspecified: Secondary | ICD-10-CM

## 2018-09-20 LAB — GLUCOSE, CAPILLARY
Glucose-Capillary: 126 mg/dL — ABNORMAL HIGH (ref 70–99)
Glucose-Capillary: 211 mg/dL — ABNORMAL HIGH (ref 70–99)
Glucose-Capillary: 131 mg/dL — ABNORMAL HIGH (ref 70–99)
Glucose-Capillary: 216 mg/dL — ABNORMAL HIGH (ref 70–99)

## 2018-09-20 LAB — RENAL FUNCTION PANEL
Albumin: 2.7 g/dL — ABNORMAL LOW (ref 3.5–5.0)
Anion gap: 11 (ref 5–15)
BUN: 32 mg/dL — ABNORMAL HIGH (ref 8–23)
CO2: 29 mmol/L (ref 22–32)
Calcium: 9 mg/dL (ref 8.9–10.3)
Chloride: 96 mmol/L — ABNORMAL LOW (ref 98–111)
Creatinine, Ser: 1.83 mg/dL — ABNORMAL HIGH (ref 0.44–1.00)
GFR calc Af Amer: 32 mL/min — ABNORMAL LOW (ref >60)
GFR calc non Af Amer: 27 mL/min — ABNORMAL LOW (ref >60)
Glucose, Bld: 132 mg/dL — ABNORMAL HIGH (ref 70–99)
Phosphorus: 2.9 mg/dL (ref 2.5–4.6)
Potassium: 2.8 mmol/L — ABNORMAL LOW (ref 3.5–5.1)
Sodium: 136 mmol/L (ref 135–145)

## 2018-09-20 LAB — MAGNESIUM: Magnesium: 2 mg/dL (ref 1.7–2.4)

## 2018-09-20 MED ORDER — AMLODIPINE BESYLATE 10 MG PO TABS
10.0000 mg | ORAL_TABLET | Freq: Every day | ORAL | Status: DC
  Administered 2018-09-21: 10 mg via ORAL
  Filled 2018-09-20: qty 1

## 2018-09-20 MED ORDER — ALBUTEROL SULFATE (2.5 MG/3ML) 0.083% IN NEBU: 2.5000 mg | INHALATION_SOLUTION | Freq: Four times a day (QID) | RESPIRATORY_TRACT | Status: DC | PRN

## 2018-09-20 MED ORDER — ENSURE MAX PROTEIN PO LIQD
11.0000 [oz_av] | Freq: Every day | ORAL | Status: DC
  Administered 2018-09-20: 11 [oz_av] via ORAL
  Filled 2018-09-20 (×2): qty 330

## 2018-09-20 MED ORDER — ACETAMINOPHEN 325 MG PO TABS
650.0000 mg | ORAL_TABLET | Freq: Once | ORAL | Status: AC
  Administered 2018-09-20: 650 mg via ORAL
  Filled 2018-09-20: qty 2

## 2018-09-20 MED ORDER — POTASSIUM CHLORIDE CRYS ER 20 MEQ PO TBCR
40.0000 meq | EXTENDED_RELEASE_TABLET | ORAL | Status: AC
  Administered 2018-09-20 (×2): 40 meq via ORAL
  Filled 2018-09-20 (×2): qty 2

## 2018-09-20 MED ORDER — POTASSIUM CHLORIDE 10 MEQ/50ML IV SOLN
10.0000 meq | INTRAVENOUS | Status: AC
  Administered 2018-09-20 (×4): 10 meq via INTRAVENOUS
  Filled 2018-09-20 (×4): qty 50

## 2018-09-20 MED ORDER — METOPROLOL SUCCINATE ER 50 MG PO TB24
50.0000 mg | ORAL_TABLET | Freq: Every day | ORAL | Status: DC
  Administered 2018-09-20 – 2018-09-21 (×2): 50 mg via ORAL
  Filled 2018-09-20 (×3): qty 1

## 2018-09-20 MED ORDER — ADULT MULTIVITAMIN W/MINERALS CH
1.0000 | ORAL_TABLET | Freq: Every day | ORAL | Status: DC
  Administered 2018-09-20 – 2018-09-21 (×2): 1 via ORAL
  Filled 2018-09-20 (×2): qty 1

## 2018-09-20 NOTE — Progress Notes (Signed)
PROGRESS NOTE    [name] [name]  CHE:[id number] DOB: Jun 15, 1945 DOA: 09/18/2018 PCP: Derinda Late, MD   Brief Narrative:  [name] [name] a 73 y.o.femalewith medical history significant ofhypertension, hyperlipidemia, diabetes mellitus, asthma, stroke, GERD, depression with anxiety, PVD, OSA not on CPAP, CAD, stent, CABG,sCHF with EF 40-45%, memory change, atrial fibrillation on Eliquis, left bundle blockage, CKD stage III, who was sent from CIR for the evaluation of hypotension .She was sent to ICU for initiation of dopamine.  She was discharged from here earlier this month to CIR after being treated for bronchopneumonia, CHF exacerbation. Her hypotension is suspected secondary to diuretics.  Diuretics have been on hold.  Heart failure team following.  Currently she is hemodynamically stable.  Off dopamin drip.  Assessment & Plan:   Principal Problem:   Hypotension Active Problems:   Chronic systolic CHF (congestive heart failure), NYHA class 3 (HCC)   CKD (chronic kidney disease), stage III (HCC)   Bradycardia   AKI (acute kidney injury) (HCC)   Pressure injury of skin    Hypotension: Suspected to be secondary to volume depletion from overdiuresis.  Continue to hold diuresis.  Currently off dopamine drip.  Current blood pressure acceptable.  Restart diuresis as per CHF team.  Slurred speech  Resolved CT head without any acute intracranial abnormalities  Chronic  systolic CHF and lobar pneumonia Diuretics on hold Follows with cardiology.  On torsemide, metolazone, spironolactone at home.  Last echocardiogram showed ejection fraction of 45-50%. Heart failure team following  AKI on CKD 3 Kidney function improving and near baseline now.  AKI precipitated secondary to hypotension  Hypokalemia Being Supplemented aggressively.  Will check magnesium level  Paroxysmal Atrial fibrillation  Cont Eliquis. Toprol on hold  Falls PT/OT recommendation for CIR.  She was  discharged with CIR on last admission   Type II diabetes mellitus with renal manifestations HbA1c of  7.Continue home regimen  Asthma No wheezing at the moment- no exacerbation  Depression with anxiety On Cymbalta + Wellbutrin  Hyperlipemia  On  Statin  Essential hypertension Antihypertensives on hold  GERD  On Protonix  CAD (coronary artery disease) of bypass graft - On Plavix, statin, Eliquis   Nutrition Problem: Increased nutrient needs Etiology: wound healing      DVT prophylaxis: Eliquis Code Status: Full Family Communication: None present at the bedside Disposition Plan: CIR in 24 to 48 hours   Consultants: Cardiology, PCCM  Procedures: None  Antimicrobials:  Anti-infectives (From admission, onward)   None      Subjective:  Patient seen and examined the bedside this morning.  Currently hemodynamically stable.  Comfortable.  Sitting in the chair.  Alert oriented.  Denies any complaints  Objective: Vitals:   09/20/18 0437 09/20/18 0500 09/20/18 0600 09/20/18 0744  BP:  (!) 128/46 (!) 132/48 (!) 146/53  Pulse:  87 93   Resp:  15 19   Temp: 98.3 F (36.8 C)   98 F (36.7 C)  TempSrc: Oral   Oral  SpO2:  97% 95%   Weight:        Intake/Output Summary (Last 24 hours) at 09/20/2018 1112 Last data filed at 09/20/2018 0600 Gross per 24 hour  Intake 390 ml  Output 3450 ml  Net -3060 ml   Filed Weights   09/18/18 0400  Weight: 108 kg    Examination:  General exam: Appears calm and comfortable ,Not in distress,average built HEENT:PERRL,Oral mucosa moist, Ear/Nose normal on gross exam Respiratory system: Bilateral equal air  entry, normal vesicular breath sounds, no wheezes or crackles  Cardiovascular system: S1 & S2 heard, RRR. No JVD, murmurs, rubs, gallops or clicks. No pedal edema. Gastrointestinal system: Abdomen is nondistended, soft and nontender. No organomegaly or masses felt. Normal bowel sounds heard. Central nervous  system: Alert and oriented. No focal neurological deficits. Extremities: No edema, no clubbing ,no cyanosis, distal peripheral pulses palpable. Skin: No rashes, lesions or ulcers,no icterus ,no pallor   Data Reviewed: I have personally reviewed following labs and imaging studies  CBC: Recent Labs  Lab 09/14/18 0538 09/18/18 0023 09/18/18 1241 09/19/18 0342  WBC 11.3* 13.5* 17.8* 12.8*  NEUTROABS 8.8*  --   --   --   HGB 12.3 10.4* 11.7* 10.5*  HCT 38.6 32.5* 36.7 32.5*  MCV 87.9 88.1 88.0 87.6  PLT 196 204 226 376   Basic Metabolic Panel: Recent Labs  Lab 09/17/18 1009 09/18/18 0023 09/18/18 1151 09/19/18 0342 09/20/18 0349  NA 131* 130* 133* 133* 136  K 3.8 4.0 4.3 3.7 2.8*  CL 89* 93* 97* 96* 96*  CO2 29 27 21* 27 29  GLUCOSE 272* 181* 204* 191* 132*  BUN 31* 43* 46* 47* 32*  CREATININE 1.87* 3.14* 3.39* 2.99* 1.83*  CALCIUM 9.1 8.3* 8.8* 9.0 9.0  PHOS  --   --   --   --  2.9   GFR: Estimated Creatinine Clearance: 33.4 mL/min (A) (by C-G formula based on SCr of 1.83 mg/dL (H)). Liver Function Tests: Recent Labs  Lab 09/14/18 0538 09/18/18 0023 09/20/18 0349  AST 40 23  --   ALT 32 19  --   ALKPHOS 104 89  --   BILITOT 1.4* 0.8  --   PROT 6.9 5.1*  --   ALBUMIN 2.9* 2.5* 2.7*   No results for input(s): LIPASE, AMYLASE in the last 168 hours. No results for input(s): AMMONIA in the last 168 hours. Coagulation Profile: No results for input(s): INR, PROTIME in the last 168 hours. Cardiac Enzymes: No results for input(s): CKTOTAL, CKMB, CKMBINDEX, TROPONINI in the last 168 hours. BNP (last 3 results) No results for input(s): PROBNP in the last 8760 hours. HbA1C: No results for input(s): HGBA1C in the last 72 hours. CBG: Recent Labs  Lab 09/19/18 0735 09/19/18 1214 09/19/18 1618 09/19/18 2143 09/20/18 0809  GLUCAP 159* 205* 173* 222* 126*   Lipid Profile: No results for input(s): CHOL, HDL, LDLCALC, TRIG, CHOLHDL, LDLDIRECT in the last 72  hours. Thyroid Function Tests: Recent Labs    09/18/18 0205  TSH 1.571  FREET4 1.60*   Anemia Panel: No results for input(s): VITAMINB12, FOLATE, FERRITIN, TIBC, IRON, RETICCTPCT in the last 72 hours. Sepsis Labs: Recent Labs  Lab 09/18/18 0023 09/18/18 1151  PROCALCITON  --  <0.10  LATICACIDVEN 1.6  --     Recent Results (from the past 240 hour(s))  MRSA PCR Screening     Status: Abnormal   Collection Time: 09/10/18  2:10 PM   Specimen: Nasal Mucosa; Nasopharyngeal  Result Value Ref Range Status   MRSA by PCR POSITIVE (A) NEGATIVE Final    Comment:        The GeneXpert MRSA Assay (FDA approved for NASAL specimens only), is one component of a comprehensive MRSA colonization surveillance program. It is not intended to diagnose MRSA infection nor to guide or monitor treatment for MRSA infections. RESULT CALLED TO, READ BACK BY AND VERIFIED WITH: RN Evelene Croon [id number] MLM Performed at [facility], [address] [place], [place] [address]   Culture, blood (routine x 2)     Status: None (Preliminary result)   Collection Time: 09/18/18  4:40 AM   Specimen: BLOOD LEFT HAND  Result Value Ref Range Status   Specimen Description BLOOD LEFT HAND  Final   Special Requests   Final    BOTTLES DRAWN AEROBIC ONLY Blood Culture results may not be optimal due to an inadequate volume of blood received in culture bottles   Culture   Final    NO GROWTH 2 DAYS Performed at [facility], [place] [address] [place], [place] [address]    Report Status PENDING  Incomplete  Culture, blood (routine x 2)     Status: None (Preliminary result)   Collection Time: 09/18/18  4:57 AM   Specimen: BLOOD RIGHT HAND  Result Value Ref Range Status   Specimen Description BLOOD RIGHT HAND  Final   Special Requests   Final    BOTTLES DRAWN AEROBIC ONLY Blood Culture results may not be optimal due to an inadequate volume of blood received in culture bottles   Culture   Final    NO  GROWTH 2 DAYS Performed at [facility], [place] [address] [place], [place] [address]    Report Status PENDING  Incomplete         Radiology Studies: Dg Chest Port 1 View  Result Date: 09/18/2018 CLINICAL DATA:  Central line placement EXAM: PORTABLE CHEST 1 VIEW COMPARISON:  09/18/2018 at 0153 hours FINDINGS: Left IJ venous catheter terminates at the cavoatrial junction. Pulmonary vascular congestion without frank interstitial edema. No pleural effusion or pneumothorax. Cardiomegaly.  Postsurgical changes related to prior CABG. Median sternotomy. IMPRESSION: Left IJ venous catheter terminates at the cavoatrial junction. Electronically Signed   By: Julian Hy M.D.   On: 09/18/2018 18:03        Scheduled Meds:  albuterol  2.5 mg Nebulization BID   apixaban  5 mg Oral BID   budesonide (PULMICORT) nebulizer solution  0.5 mg Nebulization BID   buPROPion  150 mg Oral Daily   Chlorhexidine Gluconate Cloth  6 each Topical Q0600   clopidogrel  75 mg Oral Daily   dextromethorphan-guaiFENesin  1 tablet Oral BID   DULoxetine  30 mg Oral Daily   gabapentin  300 mg Oral Daily   insulin aspart  0-15 Units Subcutaneous TID WC   insulin glargine  38 Units Subcutaneous QHS   mouth rinse  15 mL Mouth Rinse BID   multivitamin with minerals  1 tablet Oral Daily   mupirocin ointment  1 application Nasal BID   potassium chloride  40 mEq Oral Q4H   Ensure Max Protein  11 oz Oral Daily   rosuvastatin  40 mg Oral q1800   sodium chloride flush  10-40 mL Intracatheter Q12H   Continuous Infusions:  sodium chloride 10 mL/hr at 09/19/18 1200     LOS: 2 days    Time spent: 35 mins.More than 50% of that time was spent in counseling and/or coordination of care.      Shelly Coss, MD [facility] Pager [phone]  If 7PM-7AM, please contact night-coverage [email or web address] Password [email or web address] 09/20/2018, 11:12 AM

## 2018-09-20 NOTE — H&P (Signed)
Physical Medicine and Rehabilitation Admission H&P     HPI: [name] [name] is a 73 year old right-handed female history of hypertension, chronic back pain maintained on oxycodone as needed, type 2 diabetes mellitus, hyperlipidemia, asthma using oxygen as needed at night, chronic diastolic congestive heart failure with ejection fraction 25%, CVA, CAD with CABG, atrial fibrillation maintained on Eliquis as well as Plavix followed by Dr. Hildred Priest, CKD stage III, OSA not on CPAP.  Per chart review she lives with spouse used a rolling walker for mobility reports multiple falls.  Husband and daughter assist as needed.  Patient had been at a skilled nursing facility until end of July for bouts of pneumonia.  They live in a two-level home with bed and bathroom upstairs.  Presented 09/09/2018 with transient slurred speech, dizziness, vomiting as well as falls x3 without loss of consciousness.  Noted bruises to left upper back and left leg.  WBC 14,500, creatinine 1.13, lactic acid 1.2, COVID negative, urinalysis negative, potassium 3.3, low-grade fever 100.9, blood pressure 177/71.  Chest x-ray showed cardiomegaly and vascular congestion with mild pulmonary edema as well as developing bronchopneumonia right lower lobe.  MRSA PCR screening positive.  X-rays of left femur left rib negative for fracture.  CT of the head as well as MRI and MRA showed no acute ischemic infarct or intracranial hemorrhage.  Severe intracranial atherosclerosis noted.  Severe stenosis of left PCA P1 and P2 segments unchanged from compared to 06/01/2014.  Severe stenosis of both internal carotid arteries at the skull base progressed compared to 06/01/2014.  Neurology follow-up to address MRI suspect subacute to chronic right frontal lobe infarction new from prior scan of February 2020.  It was advised to continue Eliquis and Plavix as prior to admission.  Patient was maintained on vancomycin and cefepime for fevers leukocytosis improved  change to Levaquin 812 20203 doses.  She did receive low-dose diuresis monitoring for signs of hypoxia with noted CO2 up to 42.  Tolerating a regular diet.  She was admitted to inpatient rehab services 09/14/2018.  Patient was slow but progressive gains.  On 8/16-8/17/2020 patient noted to be hypotensive with systolics in the 54U and diastolic in the 98J.  Cardiology had initially been consulted 09/16/2018 and Demadex was held placed on IV fluids noted elevation in creatinine from 1.688 1420 20-3.14 09/18/2018.  She initially remained on Norvasc that have been decreased to 5 mg as well as losartan and Toprol.  A follow-up echocardiogram showed ejection fraction of 50% mildly reduced systolic function.  Patient received a total of 1 L IV fluids but continued to be hypotensive in addition to bradycardic heart rate ranging 30s to 40s as well as leukocytosis 17,800 and she was discharged to acute care services hospitalist team 09/18/2018.  She was placed on dopamine drip initially hypotension suspect secondary to volume depletion from overdiuresis and diuretics remained on hold.  A cranial CT scan was completed due to some questionable increasing slurred speech that was unremarkable.  Heart failure team continue to follow with medication changes as directed low-dose Demadex initiated.  Creatinine rebounded nicely to 1.83 also monitoring of hypokalemia.  She remained on Eliquis as well as Plavix for her atrial fibrillation.  Blood pressure  normalizing and therapies were reinitiated and patient tolerating nicely.  She is readmitted back to inpatient rehab services for comprehensive therapy  Review of Systems  Constitutional: Negative for fever.  HENT: Negative for hearing loss.   Eyes: Negative for blurred vision.  Respiratory:  Positive for shortness of breath. Negative for cough.   Cardiovascular: Positive for leg swelling. Negative for chest pain.       Anginal pain  Gastrointestinal: Positive for constipation.  Negative for heartburn, nausea and vomiting.       GERD  Genitourinary: Negative for dysuria, flank pain and hematuria.  Musculoskeletal: Positive for back pain, joint pain and myalgias.  Skin: Negative for rash.  Neurological: Positive for dizziness and headaches.  Psychiatric/Behavioral: Positive for depression. The patient has insomnia.        Anxiety  All other systems reviewed and are negative.  Past Medical History:  Diagnosis Date   Abnormality of gait 05/14/2014   Anemia    hx   Anginal pain (HCC)    Anxiety    Asthma    Basal cell carcinoma 05/2014   "left shoulder"   Bundle branch block, left    chronic/notes 07/18/2013   CHF (congestive heart failure) ([place])    Chronic bronchitis ([place])    "off and on all the time" (07/18/2013)   Chronic insomnia 05/06/2015   Chronic kidney disease    frequency, sees dr Jamal Maes every 4 to 6 months (01/16/2018)   Chronic low back pain 08/24/2016   Chronic lower back pain    Claustrophobia    Common migraine 05/14/2014   Coronary artery disease    Depression    Dysrhythmia    GERD (gastroesophageal reflux disease)    H/O hiatal hernia    Headache    "at least 2/month" (01/16/2018)   Heart murmur    Hyperlipidemia    Hypertension    Irregular heart beat    Leg cramps    both legs at times   Melanoma ([place]) 05/2014   "burned off BLE" (01/16/2018)   Memory change 05/14/2014   Migraine    "5-6/year"  (01/16/2018)   Myocardial infarction ([place]) 04/1999, 02/2000, 01/2005; 2011; 2014   Neuromuscular disorder ([place])    ?   Obesity 01-2010   Obstructive sleep apnea    "can't wear machine; I have claustrophobia" (01/16/2018), states she had a 2nd sleep study and does not have sleep apnea, her O2 decreases and now is on 2 L of O2 at night.   On home oxygen therapy    "2L at night and prn during daytime" (01/16/2018)   Osteoarthritis    "knees and hands" (01/16/2018)   Other and unspecified angina  pectoris    Peripheral vascular disease ([place])    ? numbness, tingling arms and legs   Pneumonia 2000's   "once"   PONV (postoperative nausea and vomiting)    Shortness of breath    with exertion   Stroke ([place]) 03/22/12   right side brain; denies residual on 07/18/2013)   Stroke [facility]) Oct. 2015   Affected pt.s balance   Stroke ([place]) 10/2014   "affected my legs; fully recovered now"; still have sporatic memory issues from this one" (01/16/2018)   Type II diabetes mellitus (HCC)    Ventral hernia    hx of   Vomiting    persistent   Vomiting blood    Past Surgical History:  Procedure Laterality Date   ANKLE FRACTURE SURGERY Left 1970's   APPENDECTOMY  1970's   w/hysterectomy   BASAL CELL CARCINOMA EXCISION Left 05/2014   "shoulder" (01/16/2018)   CARDIAC CATHETERIZATION  10/10/2012   Dr [name].   CARDIAC CATHETERIZATION N/A 05/29/2015   Procedure: Right/Left Heart Cath and Coronary/Graft Angiography;  Surgeon: Elby Showers  [name], MD;  Location: [facility];  Service: Cardiovascular;  Laterality: N/A;   CAROTID ENDARTERECTOMY Left 03/2013   CAROTID STENT INSERTION Left 03/20/2013   Procedure: CAROTID STENT INSERTION;  Surgeon: [name], MD;  Location[facility] C[facility] LAB;  Service: Cardiovascular;  Laterality: Left;  internal carotid   CEREBRAL ANGIOGRAM N/A 04/05/2011   Procedure: CEREBRAL ANGIOGRAM;  Surgeon: Angelia Mould, MD;  Location: [facility] CATH LAB;  Service: Cardiovascular;  Laterality: N/A;   CHOLECYSTECTOMY OPEN  2004   CORONARY ANGIOPLASTY WITH STENT PLACEMENT  01,02,05,06,07,08,11; 04/24/2013   "I've probably got ~ 10 stents by now" (04/24/2013)   CORONARY ANGIOPLASTY WITH STENT PLACEMENT  06/13/2013   "got 4 stents today" (06/13/2013)   CORONARY ARTERY BYPASS GRAFT  1220/11   "CABG X5"   CORONARY STENT INTERVENTION N/A 10/20/2017   Procedure: CORONARY STENT INTERVENTION;  Surgeon: Troy Sine, MD;  Location: [facility];  Service:  Cardiovascular;  Laterality: N/A;   ESOPHAGOGASTRODUODENOSCOPY  08/03/2011   Procedure: ESOPHAGOGASTRODUODENOSCOPY (EGD);  Surgeon: Shann Medal, MD;  Location: [facility];  Service: General;  Laterality: N/A;   ESOPHAGOGASTRODUODENOSCOPY (EGD) WITH PROPOFOL N/A 03/11/2014   Procedure: ESOPHAGOGASTRODUODENOSCOPY (EGD) WITH PROPOFOL;  Surgeon: Lafayette Dragon, MD;  Location: [facility];  Service: Endoscopy;  Laterality: N/A;   FRACTURE SURGERY     gall stone removal  05/2003   GASTRIC RESTRICTION SURGERY  1984   "stapeling"   HERNIA REPAIR  2004   "in my stomach; had OR on it twice", wire mesh on 1 hernia   LEFT HEART CATH AND CORS/GRAFTS ANGIOGRAPHY N/A 07/09/2016   Procedure: LEFT HEART CATH AND CORS/GRAFTS ANGIOGRAPHY;  Surgeon: Larey Dresser, MD;  Location: [facility];  Service: Cardiovascular;  Laterality: N/A;   ORIF ANKLE FRACTURE Left 05/16/2018   ORIF ANKLE FRACTURE Left 05/16/2018   Procedure: OPEN REDUCTION INTERNAL FIXATION (ORIF) Left ankle with possible syndesmosis fixation;  Surgeon: Nicholes Stairs, MD;  Location: [facility];  Service: Orthopedics;  Laterality: Left;  130min   OVARY SURGERY  1970's   "tumor removed"   PERCUTANEOUS CORONARY STENT INTERVENTION (PCI-S) N/A 06/13/2013   Procedure: PERCUTANEOUS CORONARY STENT INTERVENTION (PCI-S);  Surgeon: [name], MD;  Location: [facility] CATH LAB;  Service: Cardiovascular;  Laterality: N/A;   PERCUTANEOUS STENT INTERVENTION N/A 04/24/2013   Procedure: PERCUTANEOUS STENT INTERVENTION;  Surgeon: [name], MD;  Location: [facility] CATH LAB;  Service: Cardiovascular;  Laterality: N/A;   RIGHT/LEFT HEART CATH AND CORONARY/GRAFT ANGIOGRAPHY N/A 10/20/2017   Procedure: RIGHT/LEFT HEART CATH AND CORONARY/GRAFT ANGIOGRAPHY;  Surgeon: Larey Dresser, MD;  Location: [facility];  Service: Cardiovascular;  Laterality: N/A;   ROOT CANAL  10/2000   TIBIA FRACTURE SURGERY Right 1970's   rods and pins    TOOTH EXTRACTION     "1 on the upper; wisdom tooth on the lower" (01/16/2018)   TOTAL ABDOMINAL HYSTERECTOMY  1970's   w/ appendectomy   Family History  Problem Relation Age of Onset   Heart disease Father        Heart Disease before age 58   Diabetes Father    Hyperlipidemia Father    Hypertension Father    Heart attack Father    Deep vein thrombosis Father    AAA (abdominal aortic aneurysm) Father    Hypertension Mother    Deep vein thrombosis Mother    AAA (abdominal aortic aneurysm) Mother    Cancer Sister  Ovarian   Hypertension Sister    Diabetes Paternal Grandmother    Heart disease Paternal Uncle    Diabetes Paternal Uncle    Heart attack Paternal Grandfather 59       died of MI at 83   Diabetes Paternal Aunt    Diabetes Paternal Uncle    Colon cancer Neg Hx    Social History:  reports that she has never smoked. She has never used smokeless tobacco. She reports that she does not drink alcohol or use drugs. Allergies:  Allergies  Allergen Reactions   Amoxicillin Shortness Of Breath and Rash   Brilinta [Ticagrelor] Shortness Of Breath   Erythromycin Shortness Of Breath, Other (See Comments) and Hives    Trouble swallowing   Flagyl [Metronidazole] Shortness Of Breath and Palpitations   Penicillins Hives, Shortness Of Breath, Rash and Other (See Comments)    Has patient had a PCN reaction causing immediate rash, facial/tongue/throat swelling, SOB or lightheadedness with hypotension: Yes Has patient had a PCN reaction causing severe rash involving mucus membranes or skin necrosis: No Has patient had a PCN reaction that required hospitalization: Yes Has patient had a PCN reaction occurring within the last 10 years: No If all of the above answers are "NO", then may proceed with Cephalosporin use.    Isosorbide Mononitrate [Isosorbide Nitrate] Other (See Comments)    Joint aches, muscles hurt, difficult to walk    Jardiance  [Empagliflozin] Other (See Comments)    Nausea, joint aches, muscles aches   Metformin And Related Other (See Comments)    Stomach pain, cold sweats, joint pain, burred vision, dizziness   Tape Other (See Comments)    Skin pulls off with certain types Plastic tape causes skin to rip if left on for long periods of time   Erythromycin Base Rash   Medications Prior to Admission  Medication Sig Dispense Refill   ADVAIR HFA 115-21 MCG/ACT inhaler Inhale 2 puffs into the lungs.     albuterol (VENTOLIN HFA) 108 (90 Base) MCG/ACT inhaler Inhale 2 puffs into the lungs every 6 (six) hours as needed for wheezing.     Alirocumab (PRALUENT) 150 MG/ML SOAJ Inject 1 pen into the skin every 14 (fourteen) days. 2 pen 11   ALPRAZolam (XANAX) 0.5 MG tablet Take 1 tablet (0.5 mg total) by mouth 2 (two) times daily. 60 tablet 0   amLODipine (NORVASC) 10 MG tablet Take 10 mg by mouth daily.     apixaban (ELIQUIS) 5 MG TABS tablet Take 1 tablet (5 mg total) by mouth 2 (two) times daily. 60 tablet 6   bisacodyl (DULCOLAX) 5 MG EC tablet Take 1 tablet (5 mg total) by mouth daily as needed for moderate constipation. 30 tablet 1   budesonide (PULMICORT) 0.25 MG/2ML nebulizer solution Take 2 mLs (0.25 mg total) by nebulization 2 (two) times daily. 60 mL 12   buPROPion (WELLBUTRIN XL) 150 MG 24 hr tablet Take 150 mg by mouth daily.     clopidogrel (PLAVIX) 75 MG tablet Take 1 tablet (75 mg total) by mouth at bedtime. 90 tablet 1   denosumab (PROLIA) 60 MG/ML SOSY injection Inject 69 mg into the skin every 6 (six) months.     DULoxetine (CYMBALTA) 30 MG capsule Take 1 capsule (30 mg total) by mouth daily.  3   ezetimibe (ZETIA) 10 MG tablet Take 1 tablet (10 mg total) by mouth at bedtime.     gabapentin (NEURONTIN) 300 MG capsule Take 2 capsules (600 mg total)  by mouth 2 (two) times daily.     insulin aspart (NOVOLOG) 100 UNIT/ML injection Inject 8 Units into the skin 3 (three) times daily with meals.  (Patient taking differently: Inject 20 Units into the skin 3 (three) times daily. ) 10 mL 11   insulin glargine (LANTUS) 100 UNIT/ML injection Inject 0.35 mLs (35 Units total) into the skin at bedtime. (Patient taking differently: Inject 38 Units into the skin at bedtime. ) 10 mL 11   ipratropium-albuterol (DUONEB) 0.5-2.5 (3) MG/3ML SOLN Take 3 mLs by nebulization 2 (two) times daily. (Patient taking differently: Take 3 mLs by nebulization 2 (two) times daily as needed (shortness of breath). ) 360 mL    metolazone (ZAROXOLYN) 2.5 MG tablet Take 2.5 mg by mouth daily.     metoprolol succinate (TOPROL-XL) 50 MG 24 hr tablet Take 25mg  daily with or immediately following a meal.     Multiple Vitamins-Minerals (HEALTHY EYES/LUTEIN) TABS Take 1 tablet by mouth daily.     nitroGLYCERIN (NITROSTAT) 0.3 MG SL tablet Place 1 tablet (0.3 mg total) under the tongue every 5 (five) minutes x 3 doses as needed for chest pain. 9 tablet 1   ondansetron (ZOFRAN) 4 MG tablet Take 4 mg by mouth every 6 (six) hours as needed for nausea/vomiting.     oxyCODONE (OXY IR/ROXICODONE) 5 MG immediate release tablet Take 1 tablet (5 mg total) by mouth every 4 (four) hours as needed for moderate pain or severe pain. (Patient not taking: Reported on 09/09/2018) 40 tablet 0   OXYGEN Inhale 2 L into the lungs at bedtime.      pantoprazole (PROTONIX) 40 MG tablet Take 1 tablet (40 mg total) by mouth daily.     potassium chloride SA (K-DUR) 20 MEQ tablet TAKE ONE (1) TABLET BY MOUTH TWO (2) TIMES DAILY 60 tablet 1   ranolazine (RANEXA) 1000 MG SR tablet Take 1,000 mg by mouth every 12 (twelve) hours.     rosuvastatin (CRESTOR) 40 MG tablet Take 1 tablet (40 mg total) by mouth at bedtime.     spironolactone (ALDACTONE) 25 MG tablet Take 1 tablet (25 mg total) by mouth daily. (Patient not taking: Reported on 09/09/2018)     torsemide (DEMADEX) 20 MG tablet Take 1 tablet (20 mg total) by mouth every other day.     traMADol  (ULTRAM) 50 MG tablet Take 1 tablet (50 mg total) by mouth every 6 (six) hours as needed for moderate pain. (Patient not taking: Reported on 09/09/2018) 40 tablet 0   triamcinolone cream (KENALOG) 0.1 % Apply 1 application topically daily.     vitamin B-12 (CYANOCOBALAMIN) 1000 MCG tablet Take 1,000 mcg by mouth daily.     zolpidem (AMBIEN) 10 MG tablet Take 10 mg by mouth every evening.      Drug Regimen Review Drug regimen was reviewed and remains appropriate with no significant issues identified  Home: Home Living Family/patient expects to be discharged to:: Private residence Living Arrangements: Spouse/significant other Available Help at Discharge: Family(spouse and daughter) Type of Home: House Home Access: Stairs to enter Technical brewer of Steps: 1 Entrance Stairs-Rails: None Home Layout: Two level, 1/2 bath on main level, Bed/bath upstairs Alternate Level Stairs-Number of Steps: has a stair lift Alternate Level Stairs-Rails: Can reach both Bathroom Shower/Tub: Multimedia programmer: Handicapped height Bathroom Accessibility: Yes Home Equipment: Environmental consultant - 2 wheels, Shower seat Additional Comments: pt hopeful to complete her rehab on CIR  Lives With: Spouse   Functional History: Prior  Function Level of Independence: Needs assistance Gait / Transfers Assistance Needed: amb with rolling walker ADL's / Homemaking Assistance Needed: husband dries her back side and supervises shower transfer, pt can dress, husband does cooking and pt has a housekeeper  Functional Status:  Mobility: Bed Mobility Overal bed mobility: Needs Assistance Bed Mobility: Supine to Sit Supine to sit: Min assist, HOB elevated General bed mobility comments: pt received in chair Transfers Overall transfer level: Needs assistance Equipment used: Rolling walker (2 wheeled) Transfers: Sit to/from Stand, Stand Pivot Transfers Sit to Stand: Min assist Stand pivot transfers: Min  assist General transfer comment: steadying assist, slow to rise, good technique Ambulation/Gait General Gait Details: did not attempt    ADL: ADL Overall ADL's : Needs assistance/impaired Eating/Feeding: Independent, Sitting Grooming: Oral care, Wash/dry hands, Wash/dry face, Sitting, Set up Upper Body Bathing: Minimal assistance, Sitting Lower Body Bathing: Minimal assistance, Sit to/from stand Upper Body Dressing : Minimal assistance, Sitting Lower Body Dressing: Minimal assistance, Sit to/from stand Toilet Transfer: Minimal assistance, Ambulation, RW Functional mobility during ADLs: Minimal assistance, +2 for safety/equipment, Rolling walker  Cognition: Cognition Overall Cognitive Status: Within Functional Limits for tasks assessed Orientation Level: (P) Oriented X4(Confused when waking up to use commode) Cognition Arousal/Alertness: Awake/alert Behavior During Therapy: WFL for tasks assessed/performed Overall Cognitive Status: Within Functional Limits for tasks assessed  Physical Exam: Blood pressure (!) 180/66, pulse 95, temperature (!) 97.5 F (36.4 C), temperature source Oral, resp. rate 12, weight 108 kg, SpO2 100 %. Physical Exam  Constitutional: No distress.  HENT:  Head: Normocephalic and atraumatic.  Eyes: Pupils are equal, round, and reactive to light. EOM are normal.  Neck: Normal range of motion. No thyromegaly present.  Cardiovascular: Normal rate, regular rhythm and normal heart sounds.  Respiratory: Effort normal and breath sounds normal. No respiratory distress. She has no wheezes. She has no rales.  GI: Soft. She exhibits no distension. There is no abdominal tenderness.  Musculoskeletal: Normal range of motion.        General: No edema.  Neurological:  Patient is alert sitting at edge of bed.  Makes good eye contact with examiner she follows full commands.  Mood is appropriate.  Skin: She is not diaphoretic.  A few scattered abrasions, skin tears along  LE's.    Psychiatric: She has a normal mood and affect. Her behavior is normal.    Results for orders placed or performed during the hospital encounter of 09/18/18 (from the past 48 hour(s))  Glucose, capillary     Status: Abnormal   Collection Time: 09/19/18  7:35 AM  Result Value Ref Range   Glucose-Capillary 159 (H) 70 - 99 mg/dL  Glucose, capillary     Status: Abnormal   Collection Time: 09/19/18 12:14 PM  Result Value Ref Range   Glucose-Capillary 205 (H) 70 - 99 mg/dL  Glucose, capillary     Status: Abnormal   Collection Time: 09/19/18  4:18 PM  Result Value Ref Range   Glucose-Capillary 173 (H) 70 - 99 mg/dL  Glucose, capillary     Status: Abnormal   Collection Time: 09/19/18  9:43 PM  Result Value Ref Range   Glucose-Capillary 222 (H) 70 - 99 mg/dL  Renal function panel     Status: Abnormal   Collection Time: 09/20/18  3:49 AM  Result Value Ref Range   Sodium 136 135 - 145 mmol/L   Potassium 2.8 (L) 3.5 - 5.1 mmol/L   Chloride 96 (L) 98 - 111 mmol/L  CO2 29 22 - 32 mmol/L   Glucose, Bld 132 (H) 70 - 99 mg/dL   BUN 32 (H) 8 - 23 mg/dL   Creatinine, Ser 1.83 (H) 0.44 - 1.00 mg/dL    Comment: DELTA CHECK NOTED   Calcium 9.0 8.9 - 10.3 mg/dL   Phosphorus 2.9 2.5 - 4.6 mg/dL   Albumin 2.7 (L) 3.5 - 5.0 g/dL   GFR calc non Af Amer 27 (L) >60 mL/min   GFR calc Af Amer 32 (L) >60 mL/min   Anion gap 11 5 - 15    Comment: Performed at [facility] [address], [place], [place] [address]  Glucose, capillary     Status: Abnormal   Collection Time: 09/20/18  8:09 AM  Result Value Ref Range   Glucose-Capillary 126 (H) 70 - 99 mg/dL  Glucose, capillary     Status: Abnormal   Collection Time: 09/20/18 11:52 AM  Result Value Ref Range   Glucose-Capillary 211 (H) 70 - 99 mg/dL  Magnesium     Status: None   Collection Time: 09/20/18  2:52 PM  Result Value Ref Range   Magnesium 2.0 1.7 - 2.4 mg/dL    Comment: Performed at [facility], [place] [address],  [place], [place] [address]  Glucose, capillary     Status: Abnormal   Collection Time: 09/20/18  4:11 PM  Result Value Ref Range   Glucose-Capillary 131 (H) 70 - 99 mg/dL  Glucose, capillary     Status: Abnormal   Collection Time: 09/20/18 10:24 PM  Result Value Ref Range   Glucose-Capillary 216 (H) 70 - 99 mg/dL  Basic metabolic panel     Status: Abnormal   Collection Time: 09/21/18  3:07 AM  Result Value Ref Range   Sodium 136 135 - 145 mmol/L   Potassium 4.0 3.5 - 5.1 mmol/L    Comment: DELTA CHECK NOTED   Chloride 99 98 - 111 mmol/L   CO2 27 22 - 32 mmol/L   Glucose, Bld 130 (H) 70 - 99 mg/dL   BUN 23 8 - 23 mg/dL   Creatinine, Ser 1.50 (H) 0.44 - 1.00 mg/dL   Calcium 9.4 8.9 - 10.3 mg/dL   GFR calc non Af Amer 35 (L) >60 mL/min   GFR calc Af Amer 40 (L) >60 mL/min   Anion gap 10 5 - 15    Comment: Performed at [facility], [place] [address] [place], [place] [address]   *Note: Due to a large number of results and/or encounters for the requested time period, some results have not been displayed. A complete set of results can be found in Results Review.   No results found.     Medical Problem List and Plan: 1.  Decreased functional mobility secondary to subacute to chronic right frontal lobe infarction/acute respiratory failure with sepsis lobar pneumonia as well as hypotension with bradycardia  -admit to inpatient rehab 2.  Antithrombotics: -DVT/anticoagulation: Eliquis  -antiplatelet therapy: Continue Plavix 75 mg daily 3. Pain Management: Neurontin 300 mg daily 4. Mood: Cymbalta 30 mg daily, Wellbutrin 150 mg daily, Xanax 0.5 mg twice daily as needed  -antipsychotic agents: N/A 5. Neuropsych: This patient is capable of making decisions on her own behalf. 6. Skin/Wound Care: Routine skin checks 7. Fluids/Electrolytes/Nutrition: Routine in and outs with follow-up chemistries 8.  Hypotension/bradycardia related to sepsis.  WBC improved 12,800.  Dopamine discontinued.   Blood cultures no growth to date.  Low-dose Aldactone 12.5 mg  daily, Toprol-XL 50 mg daily.  Daily monitoring of BP's at rest and with activity 9.  Acute on chronic diastolic congestive heart failure.  Demadex 20 mg daily.  Follow-up heart failure team  -daily weights 10.  AKI/CKD stage III.  Renal baseline 1.5-1.8.  Follow-up chemistries 11.  PAF.  Continue Eliquis.  Cardiac rate controlled 12.  CAD with CABG continue Plavix. 13.  OSA/asthma.  Intolerant of CPAP. 14.  Diabetes mellitus.  Hemoglobin A1c 7.0.  Lantus insulin 38 units nightly.  Check blood sugars before meals and at bedtime. Adjust regimen as needed 15.  ID/MRSA PCR screening positive/pneumonia.  Levaquin completed. 16 hyperlipidemia.  Crestor     Lavon Paganini Angiulli, PA-C 09/21/2018

## 2018-09-20 NOTE — Evaluation (Signed)
Occupational Therapy Evaluation Patient Details Name: [name] [name] [name]: [id number] DOB: 23-Dec-1945 Today's Date: 09/20/2018    History of Present Illness Pt readmitted from CIR with hypotension and bradycardia. Pt originally admitted 8/8 with slurred speech, falls, fever, dizziness, N/V and SOB. Found to have Acute systolic CHF, lobar pneumonia, acute respiratory failure and sepsis.Head CT-new area hypoattenuation in right frontal lobe. MRA-multiple areas of stenosis including severe b/l ICA stenosis. CXR-pulmonary edema.  PMH includes OA, CVA, MI, HTN, HLD, CKD, PVD, DM2, CHF, CAD, anxiety, ankle fx   Clinical Impression   Pt was assisted minimally for bathing and relied on her husband and paid help for IADL prior to admission. Presents with generalized weakness and decreased standing balance. Pt currently requires min assist for transfers with RW and up to moderate assistance for ADL. She denied any dizziness during this visit and VS were stable. Pt is eager to return to CIR and complete her rehab. Will follow acutely.    Follow Up Recommendations  CIR;Supervision/Assistance - 24 hour    Equipment Recommendations  None recommended by OT    Recommendations for Other Services       Precautions / Restrictions Precautions Precautions: Fall Precaution Comments: hx of falls at home (~8 in last 6 months)      Mobility Bed Mobility               General bed mobility comments: pt received in chair  Transfers Overall transfer level: Needs assistance Equipment used: Rolling walker (2 wheeled) Transfers: Sit to/from [facility] Sit to Stand: Min assist Stand pivot transfers: Min assist       General transfer comment: steadying assist, slow to rise, good technique    Balance Overall balance assessment: Needs assistance;History of Falls Sitting-balance support: No upper extremity supported;Feet supported Sitting balance-Leahy Scale: Good Sitting balance -  Comments: no LOB with donning socks   Standing balance support: Bilateral upper extremity supported;During functional activity Standing balance-Leahy Scale: Poor                             ADL either performed or assessed with clinical judgement   ADL Overall ADL's : Needs assistance/impaired Eating/Feeding: Independent;Sitting   Grooming: Oral care;Wash/dry hands;Wash/dry face;Sitting;Set up   Upper Body Bathing: Minimal assistance;Sitting   Lower Body Bathing: Minimal assistance;Sit to/from stand   Upper Body Dressing : Minimal assistance;Sitting   Lower Body Dressing: Minimal assistance;Sit to/from stand   Toilet Transfer: Minimal assistance;Ambulation;RW           Functional mobility during ADLs: Minimal assistance;+2 for safety/equipment;Rolling walker       Vision Baseline Vision/History: Wears glasses Wears Glasses: At all times Patient Visual Report: No change from baseline       Perception     Praxis      Pertinent Vitals/Pain Pain Assessment: Faces Faces Pain Scale: Hurts a little bit Pain Location: buttocks Pain Descriptors / Indicators: Sore Pain Intervention(s): Repositioned     Hand Dominance Right   Extremity/Trunk Assessment Upper Extremity Assessment Upper Extremity Assessment: Generalized weakness   Lower Extremity Assessment Lower Extremity Assessment: Defer to PT evaluation   Cervical / Trunk Assessment Cervical / Trunk Assessment: Other exceptions Cervical / Trunk Exceptions: obesity   Communication Communication Communication: No difficulties   Cognition Arousal/Alertness: Awake/alert Behavior During Therapy: WFL for tasks assessed/performed Overall Cognitive Status: Within Functional Limits for tasks assessed  General Comments       Exercises     Shoulder Instructions      Home Living Family/patient expects to be discharged to:: Private residence Living  Arrangements: Spouse/significant other Available Help at Discharge: Family(spouse and daughter) Type of Home: House Home Access: Stairs to enter Technical brewer of Steps: 1 Entrance Stairs-Rails: None Home Layout: Two level;1/2 bath on main level;Bed/bath upstairs Alternate Level Stairs-Number of Steps: has a stair lift Alternate Level Stairs-Rails: Can reach both Bathroom Shower/Tub: Occupational psychologist: Handicapped height     Home Equipment: Environmental consultant - 2 wheels;Shower seat   Additional Comments: pt hopeful to complete her rehab on CIR  Lives With: Spouse    Prior Functioning/Environment Level of Independence: Needs assistance  Gait / Transfers Assistance Needed: amb with rolling walker ADL's / Homemaking Assistance Needed: husband dries her back side and supervises shower transfer, pt can dress, husband does cooking and pt has a housekeeper            OT Problem List: Decreased strength;Decreased activity tolerance;Impaired balance (sitting and/or standing);Decreased safety awareness;Decreased knowledge of use of DME or AE      OT Treatment/Interventions: Self-care/ADL training;DME and/or AE instruction;Patient/family education;Balance training;Therapeutic activities    OT Goals(Current goals can be found in the care plan section) Acute Rehab OT Goals Patient Stated Goal: finish rehab on CIR OT Goal Formulation: With patient Time For Goal Achievement: 10/04/18 Potential to Achieve Goals: Good ADL Goals Pt Will Perform Grooming: (P) with supervision;standing Pt Will Perform Lower Body Bathing: (P) with supervision;sit to/from stand Pt Will Perform Lower Body Dressing: (P) with supervision;sit to/from stand Pt Will Transfer to Toilet: (P) with supervision;ambulating Pt Will Perform Toileting - Clothing Manipulation and hygiene: (P) with supervision;sit to/from stand Pt Will Perform Tub/Shower Transfer: (P) with supervision;ambulating;shower seat;rolling  walker  OT Frequency: Min 2X/week   Barriers to D/C:            Co-evaluation              AM-PAC OT "6 Clicks" Daily Activity     Outcome Measure Help from another person eating meals?: None Help from another person taking care of personal grooming?: A Little Help from another person toileting, which includes using toliet, bedpan, or urinal?: A Lot Help from another person bathing (including washing, rinsing, drying)?: A Little Help from another person to put on and taking off regular upper body clothing?: A Little Help from another person to put on and taking off regular lower body clothing?: A Little 6 Click Score: 18   End of Session Equipment Utilized During Treatment: Gait belt;Rolling walker  Activity Tolerance: Patient tolerated treatment well Patient left: in chair;with call bell/phone within reach;with chair alarm set;with nursing/sitter in room  OT Visit Diagnosis: Unsteadiness on feet (R26.81);Other abnormalities of gait and mobility (R26.89);Muscle weakness (generalized) (M62.81)                Time: 3532-9924 OT Time Calculation (min): 18 min Charges:  OT General Charges $OT Visit: 1 Visit OT Evaluation $OT Eval Moderate Complexity: 1 Mod  Nestor Lewandowsky, OTR/L Acute Rehabilitation Services Pager: [phone] Office: [phone] Malka So 09/20/2018, 10:20 AM

## 2018-09-20 NOTE — Progress Notes (Signed)
Advanced Heart Failure Rounding Note   Subjective:    Feels much better. SBP 120-130s. Denies dizziness. Renal function much improved.  Good urine output off diuretics. CVP line not working   Objective:   Weight Range:  Vital Signs:   Temp:  [97.9 F (36.6 C)-98.3 F (36.8 C)] 98.1 F (36.7 C) (08/19 1100) Pulse Rate:  [68-94] 85 (08/19 1400) Resp:  [13-22] 21 (08/19 1400) BP: (126-183)/(43-120) 140/120 (08/19 1400) SpO2:  [94 %-100 %] 100 % (08/19 1400) Last BM Date: 09/20/18  Weight change: Filed Weights   09/18/18 0400  Weight: 108 kg    Intake/Output:   Intake/Output Summary (Last 24 hours) at 09/20/2018 1533 Last data filed at 09/20/2018 1400 Gross per 24 hour  Intake 345.01 ml  Output 3500 ml  Net -3154.99 ml     Physical Exam: General:  Well appearing. No resp difficulty HEENT: normal Neck: supple. JVP . Carotids 2+ bilat; no bruits. No lymphadenopathy or thryomegaly appreciated. Cor: PMI nondisplaced. Regular rate & rhythm. No rubs, gallops or murmurs. Lungs: clear Abdomen: obese soft, nontender, nondistended. No hepatosplenomegaly. No bruits or masses. Good bowel sounds. Extremities: no cyanosis, clubbing, rash, edema Neuro: alert & orientedx3, cranial nerves grossly intact. moves all 4 extremities w/o difficulty. Affect pleasant  Telemetry: NSR 90s Personally reviewed   Labs: Basic Metabolic Panel: Recent Labs  Lab 09/17/18 1009 09/18/18 0023 09/18/18 1151 09/19/18 0342 09/20/18 0349 09/20/18 1452  NA 131* 130* 133* 133* 136  --   K 3.8 4.0 4.3 3.7 2.8*  --   CL 89* 93* 97* 96* 96*  --   CO2 29 27 21* 27 29  --   GLUCOSE 272* 181* 204* 191* 132*  --   BUN 31* 43* 46* 47* 32*  --   CREATININE 1.87* 3.14* 3.39* 2.99* 1.83*  --   CALCIUM 9.1 8.3* 8.8* 9.0 9.0  --   MG  --   --   --   --   --  2.0  PHOS  --   --   --   --  2.9  --     Liver Function Tests: Recent Labs  Lab 09/14/18 0538 09/18/18 0023 09/20/18 0349  AST 40 23  --    ALT 32 19  --   ALKPHOS 104 89  --   BILITOT 1.4* 0.8  --   PROT 6.9 5.1*  --   ALBUMIN 2.9* 2.5* 2.7*   No results for input(s): LIPASE, AMYLASE in the last 168 hours. No results for input(s): AMMONIA in the last 168 hours.  CBC: Recent Labs  Lab 09/14/18 0538 09/18/18 0023 09/18/18 1241 09/19/18 0342  WBC 11.3* 13.5* 17.8* 12.8*  NEUTROABS 8.8*  --   --   --   HGB 12.3 10.4* 11.7* 10.5*  HCT 38.6 32.5* 36.7 32.5*  MCV 87.9 88.1 88.0 87.6  PLT 196 204 226 210    Cardiac Enzymes: No results for input(s): CKTOTAL, CKMB, CKMBINDEX, TROPONINI in the last 168 hours.  BNP: BNP (last 3 results) Recent Labs    04/10/18 0512 09/10/18 0925 09/18/18 0627  BNP 149.9* >4,500.0* 226.7*    ProBNP (last 3 results) No results for input(s): PROBNP in the last 8760 hours.    Other results:  Imaging: Dg Chest Port 1 View  Result Date: 09/18/2018 CLINICAL DATA:  Central line placement EXAM: PORTABLE CHEST 1 VIEW COMPARISON:  09/18/2018 at 0153 hours FINDINGS: Left IJ venous catheter terminates at the cavoatrial junction.  Pulmonary vascular congestion without frank interstitial edema. No pleural effusion or pneumothorax. Cardiomegaly.  Postsurgical changes related to prior CABG. Median sternotomy. IMPRESSION: Left IJ venous catheter terminates at the cavoatrial junction. Electronically Signed   By: Julian Hy M.D.   On: 09/18/2018 18:03      Medications:     Scheduled Medications: . apixaban  5 mg Oral BID  . buPROPion  150 mg Oral Daily  . Chlorhexidine Gluconate Cloth  6 each Topical Q0600  . clopidogrel  75 mg Oral Daily  . dextromethorphan-guaiFENesin  1 tablet Oral BID  . DULoxetine  30 mg Oral Daily  . gabapentin  300 mg Oral Daily  . insulin aspart  0-15 Units Subcutaneous TID WC  . insulin glargine  38 Units Subcutaneous QHS  . multivitamin with minerals  1 tablet Oral Daily  . mupirocin ointment  1 application Nasal BID  . potassium chloride  40 mEq  Oral Q4H  . Ensure Max Protein  11 oz Oral Daily  . rosuvastatin  40 mg Oral q1800  . sodium chloride flush  10-40 mL Intracatheter Q12H     Infusions: . sodium chloride Stopped (09/20/18 0825)     PRN Medications:  sodium chloride, albuterol, ALPRAZolam, ondansetron (ZOFRAN) IV, sodium chloride flush   Assessment/Plan:   1. Hypotension - suspect due to overdiuresis. Now improved with holding diuretics - reports some orthostasis at home but not detected here. Can use midodrine as needed  2. AKI/ CKD Stage III - Due to overdiuresis - Renal function has been labile.Baseline likely 1.5-1.8 - Peaked 3.3> 2.9  - Creatinine 1.83 today - Resume low dose diuretics tomorrow  3. Chronic Diastolic/Systolic HF - Appears dry.  - EF 45-50% on echo 09/17/18 - Hold diuretics one more day  - Will begin to resume HF meds tomorrow  4. PAF - Maintaining NSR. On eliquis 5 mg twice a day . Continue   5. CAD  S/P CABG.  -Occluded native RCA and SVG-RCA. Status post opening of CTO RCA with 4 overlapping Xience DES in 5/15.DES to sequential SVG-OM1/PLOM in 9/19.  9/2020S/P DES .  -No chest pain. Continue plavix 75 mg + crestor 40 mg daily + elqiuis.    6. OSA Intolerant CPAP   Length of Stay: 2   Glori Bickers MD 09/20/2018, 3:33 PM  Advanced Heart Failure Team Pager [phone] (M-F; [place])  Please contact [facility] for night-coverage after hours (4p -7a ) and weekends on [email or web address]

## 2018-09-20 NOTE — Progress Notes (Signed)
Pt has arrived to the floor, alert, oriented, and hungry.  Pt is eating her dinner and then we will check vitals, set up purwick and do assessment per pt request.   Vitals checked at 6pm, new order on chart.

## 2018-09-20 NOTE — Progress Notes (Signed)
[facility] Progress Note Patient Name: [name] [name] DOB: 04-14-1945 [name]: [id number]   Date of Service  09/20/2018  HPI/Events of Note  Hypokalemia - K+ -= 2.8 and Creatinine = 1.83.   eICU Interventions  Will order: 1. Replace K+. 2. Repeat BMP at 12 noon.      Intervention Category Major Interventions: Electrolyte abnormality - evaluation and management  Kristianna Saperstein [place] 09/20/2018, 6:14 AM

## 2018-09-20 NOTE — Progress Notes (Signed)
Initial Nutrition Assessment RD working remotely.  DOCUMENTATION CODES:   Morbid obesity  INTERVENTION:    Ensure Max po once daily, each supplement provides 150 kcal and 30 grams of protein.    Multivitamin with minerals daily.  NUTRITION DIAGNOSIS:   Increased nutrient needs related to wound healing as evidenced by estimated needs.  GOAL:   Patient will meet greater than or equal to 90% of their needs  MONITOR:   PO intake, Supplement acceptance, Labs, Skin  REASON FOR ASSESSMENT:   Malnutrition Screening Tool    ASSESSMENT:   73 yo female admitted to CIR 8/12 with slurred speech and multiple falls, no acute infarct found on MRI. Transferred to ICU from CIR on 8/17 with persistent bradycardia and hypotension. PMH includes HTN, HLD, DM-2, asthma, CVA, OSA, CAD s/p PCI & CABG, CHF, CKD-3.   Per review of weight encounters, patient has lost ~11% of usual weight within the past 6 months. She has been in the hospital twice this year, once for PNA and once for ankle fracture repair. Suspect intake over the past 6 months has been suboptimal. Weight loss could also be related to fluid status with hx of CHF.  Since admission, patient has been eating very well, consuming 80-100% of meals. Patient would benefit from additional protein intake to support wound healing.   Labs reviewed. Potassium 2.8 (L), BUN 32 (H), creatinine 1.83 (H) CBG's: 222-126  Medications reviewed and include novolog, lantus, IV KCl.  Per RN documentation, patient has non pitting edema to all extremities.  NUTRITION - FOCUSED PHYSICAL EXAM:  unable to complete  Diet Order:   Diet Order            Diet Carb Modified Fluid consistency: Thin; Room service appropriate? Yes  Diet effective now              EDUCATION NEEDS:   Not appropriate for education at this time  Skin:  Skin Assessment: Skin Integrity Issues: Skin Integrity Issues:: Stage II, Other (Comment) Stage II: buttocks Other:  MASD to buttocks  Last BM:  8/16 type 3  Height:   Ht Readings from Last 1 Encounters:  09/17/18 5' 2.5" (1.588 m)    Weight:   Wt Readings from Last 1 Encounters:  09/18/18 108 kg    Ideal Body Weight:  51.1 kg  BMI:  Body mass index is 42.85 kg/m.  Estimated Nutritional Needs:   Kcal:  1694-5038  Protein:  105-120 gm  Fluid:  >/= 1.8 L    Molli Barrows, RD, LDN, [place] Pager [phone] After Hours Pager [phone]

## 2018-09-21 ENCOUNTER — Inpatient Hospital Stay (HOSPITAL_COMMUNITY)
Admission: RE | Admit: 2018-09-21 | Discharge: 2018-09-28 | DRG: 945 | Disposition: A | Discharge status: Home Health | Payer: Medicare Other | Source: Intra-hospital | Attending: Physical Medicine & Rehabilitation | Admitting: Physical Medicine & Rehabilitation

## 2018-09-21 ENCOUNTER — Encounter (HOSPITAL_COMMUNITY): Payer: Self-pay | Admitting: *Deleted

## 2018-09-21 ENCOUNTER — Other Ambulatory Visit: Payer: Self-pay

## 2018-09-21 DIAGNOSIS — Z955 Presence of coronary angioplasty implant and graft: Secondary | ICD-10-CM

## 2018-09-21 DIAGNOSIS — I672 Cerebral atherosclerosis: Secondary | ICD-10-CM | POA: Diagnosis present

## 2018-09-21 DIAGNOSIS — I951 Orthostatic hypotension: Secondary | ICD-10-CM | POA: Diagnosis not present

## 2018-09-21 DIAGNOSIS — F40240 Claustrophobia: Secondary | ICD-10-CM | POA: Diagnosis present

## 2018-09-21 DIAGNOSIS — E876 Hypokalemia: Secondary | ICD-10-CM | POA: Diagnosis not present

## 2018-09-21 DIAGNOSIS — I2510 Atherosclerotic heart disease of native coronary artery without angina pectoris: Secondary | ICD-10-CM | POA: Diagnosis present

## 2018-09-21 DIAGNOSIS — N183 Chronic kidney disease, stage 3 (moderate): Secondary | ICD-10-CM | POA: Diagnosis present

## 2018-09-21 DIAGNOSIS — I447 Left bundle-branch block, unspecified: Secondary | ICD-10-CM | POA: Diagnosis present

## 2018-09-21 DIAGNOSIS — E1169 Type 2 diabetes mellitus with other specified complication: Secondary | ICD-10-CM

## 2018-09-21 DIAGNOSIS — Z888 Allergy status to other drugs, medicaments and biological substances status: Secondary | ICD-10-CM

## 2018-09-21 DIAGNOSIS — Z833 Family history of diabetes mellitus: Secondary | ICD-10-CM

## 2018-09-21 DIAGNOSIS — R5381 Other malaise: Secondary | ICD-10-CM | POA: Diagnosis present

## 2018-09-21 DIAGNOSIS — Z7902 Long term (current) use of antithrombotics/antiplatelets: Secondary | ICD-10-CM

## 2018-09-21 DIAGNOSIS — Z808 Family history of malignant neoplasm of other organs or systems: Secondary | ICD-10-CM

## 2018-09-21 DIAGNOSIS — I252 Old myocardial infarction: Secondary | ICD-10-CM

## 2018-09-21 DIAGNOSIS — Z9981 Dependence on supplemental oxygen: Secondary | ICD-10-CM | POA: Diagnosis not present

## 2018-09-21 DIAGNOSIS — E785 Hyperlipidemia, unspecified: Secondary | ICD-10-CM | POA: Diagnosis present

## 2018-09-21 DIAGNOSIS — Z8249 Family history of ischemic heart disease and other diseases of the circulatory system: Secondary | ICD-10-CM

## 2018-09-21 DIAGNOSIS — I5042 Chronic combined systolic (congestive) and diastolic (congestive) heart failure: Secondary | ICD-10-CM | POA: Diagnosis present

## 2018-09-21 DIAGNOSIS — E669 Obesity, unspecified: Secondary | ICD-10-CM | POA: Diagnosis present

## 2018-09-21 DIAGNOSIS — Z880 Allergy status to penicillin: Secondary | ICD-10-CM

## 2018-09-21 DIAGNOSIS — Z951 Presence of aortocoronary bypass graft: Secondary | ICD-10-CM | POA: Diagnosis not present

## 2018-09-21 DIAGNOSIS — N179 Acute kidney failure, unspecified: Secondary | ICD-10-CM | POA: Diagnosis not present

## 2018-09-21 DIAGNOSIS — K219 Gastro-esophageal reflux disease without esophagitis: Secondary | ICD-10-CM | POA: Diagnosis present

## 2018-09-21 DIAGNOSIS — I130 Hypertensive heart and chronic kidney disease with heart failure and stage 1 through stage 4 chronic kidney disease, or unspecified chronic kidney disease: Secondary | ICD-10-CM | POA: Diagnosis present

## 2018-09-21 DIAGNOSIS — G4733 Obstructive sleep apnea (adult) (pediatric): Secondary | ICD-10-CM | POA: Diagnosis present

## 2018-09-21 DIAGNOSIS — Z6841 Body Mass Index (BMI) 40.0 and over, adult: Secondary | ICD-10-CM

## 2018-09-21 DIAGNOSIS — Z881 Allergy status to other antibiotic agents status: Secondary | ICD-10-CM

## 2018-09-21 DIAGNOSIS — I679 Cerebrovascular disease, unspecified: Secondary | ICD-10-CM

## 2018-09-21 DIAGNOSIS — I5033 Acute on chronic diastolic (congestive) heart failure: Secondary | ICD-10-CM

## 2018-09-21 DIAGNOSIS — Z8349 Family history of other endocrine, nutritional and metabolic diseases: Secondary | ICD-10-CM

## 2018-09-21 DIAGNOSIS — E1122 Type 2 diabetes mellitus with diabetic chronic kidney disease: Secondary | ICD-10-CM | POA: Diagnosis present

## 2018-09-21 DIAGNOSIS — I5022 Chronic systolic (congestive) heart failure: Secondary | ICD-10-CM

## 2018-09-21 DIAGNOSIS — J45909 Unspecified asthma, uncomplicated: Secondary | ICD-10-CM | POA: Diagnosis present

## 2018-09-21 DIAGNOSIS — I480 Paroxysmal atrial fibrillation: Secondary | ICD-10-CM | POA: Diagnosis present

## 2018-09-21 DIAGNOSIS — Z7951 Long term (current) use of inhaled steroids: Secondary | ICD-10-CM

## 2018-09-21 DIAGNOSIS — T502X5A Adverse effect of carbonic-anhydrase inhibitors, benzothiadiazides and other diuretics, initial encounter: Secondary | ICD-10-CM | POA: Diagnosis not present

## 2018-09-21 DIAGNOSIS — Z91048 Other nonmedicinal substance allergy status: Secondary | ICD-10-CM

## 2018-09-21 DIAGNOSIS — E1151 Type 2 diabetes mellitus with diabetic peripheral angiopathy without gangrene: Secondary | ICD-10-CM | POA: Diagnosis present

## 2018-09-21 DIAGNOSIS — Z85820 Personal history of malignant melanoma of skin: Secondary | ICD-10-CM | POA: Diagnosis not present

## 2018-09-21 DIAGNOSIS — Z7901 Long term (current) use of anticoagulants: Secondary | ICD-10-CM

## 2018-09-21 DIAGNOSIS — Z794 Long term (current) use of insulin: Secondary | ICD-10-CM

## 2018-09-21 LAB — GLUCOSE, CAPILLARY
Glucose-Capillary: 180 mg/dL — ABNORMAL HIGH (ref 70–99)
Glucose-Capillary: 169 mg/dL — ABNORMAL HIGH (ref 70–99)
Glucose-Capillary: 188 mg/dL — ABNORMAL HIGH (ref 70–99)
Glucose-Capillary: 149 mg/dL — ABNORMAL HIGH (ref 70–99)

## 2018-09-21 LAB — BASIC METABOLIC PANEL
Anion gap: 10 (ref 5–15)
BUN: 23 mg/dL (ref 8–23)
CO2: 27 mmol/L (ref 22–32)
Calcium: 9.4 mg/dL (ref 8.9–10.3)
Chloride: 99 mmol/L (ref 98–111)
Creatinine, Ser: 1.5 mg/dL — ABNORMAL HIGH (ref 0.44–1.00)
GFR calc Af Amer: 40 mL/min — ABNORMAL LOW (ref >60)
GFR calc non Af Amer: 35 mL/min — ABNORMAL LOW (ref >60)
Glucose, Bld: 130 mg/dL — ABNORMAL HIGH (ref 70–99)
Potassium: 4 mmol/L (ref 3.5–5.1)
Sodium: 136 mmol/L (ref 135–145)

## 2018-09-21 MED ORDER — METOPROLOL SUCCINATE ER 50 MG PO TB24
50.0000 mg | ORAL_TABLET | Freq: Every day | ORAL | Status: DC
  Administered 2018-09-22 – 2018-09-28 (×7): 50 mg via ORAL
  Filled 2018-09-21 (×7): qty 1

## 2018-09-21 MED ORDER — ROSUVASTATIN CALCIUM 20 MG PO TABS
40.0000 mg | ORAL_TABLET | Freq: Every day | ORAL | Status: DC
  Administered 2018-09-21 – 2018-09-27 (×7): 40 mg via ORAL
  Filled 2018-09-21 (×8): qty 2

## 2018-09-21 MED ORDER — DULOXETINE HCL 30 MG PO CPEP
30.0000 mg | ORAL_CAPSULE | Freq: Every day | ORAL | Status: DC
  Administered 2018-09-22 – 2018-09-28 (×7): 30 mg via ORAL
  Filled 2018-09-21 (×7): qty 1

## 2018-09-21 MED ORDER — ALPRAZOLAM 0.5 MG PO TABS
0.5000 mg | ORAL_TABLET | Freq: Two times a day (BID) | ORAL | Status: DC | PRN
  Filled 2018-09-21: qty 1

## 2018-09-21 MED ORDER — TORSEMIDE 20 MG PO TABS
20.0000 mg | ORAL_TABLET | Freq: Every day | ORAL | Status: DC
  Administered 2018-09-21: 20 mg via ORAL
  Filled 2018-09-21: qty 1

## 2018-09-21 MED ORDER — ALBUTEROL SULFATE (2.5 MG/3ML) 0.083% IN NEBU: 2.5000 mg | INHALATION_SOLUTION | Freq: Four times a day (QID) | RESPIRATORY_TRACT | Status: DC | PRN

## 2018-09-21 MED ORDER — APIXABAN 5 MG PO TABS
5.0000 mg | ORAL_TABLET | Freq: Two times a day (BID) | ORAL | Status: DC
  Administered 2018-09-21 – 2018-09-28 (×14): 5 mg via ORAL
  Filled 2018-09-21 (×14): qty 1

## 2018-09-21 MED ORDER — SORBITOL 70 % SOLN: 30.0000 mL | Freq: Every day | Status: DC | PRN

## 2018-09-21 MED ORDER — MUPIROCIN 2 % EX OINT
1.0000 "application " | TOPICAL_OINTMENT | Freq: Two times a day (BID) | CUTANEOUS | Status: AC
  Administered 2018-09-21 – 2018-09-23 (×5): 1 via NASAL
  Filled 2018-09-21: qty 22

## 2018-09-21 MED ORDER — TORSEMIDE 20 MG PO TABS: 20.0000 mg | ORAL_TABLET | Freq: Every day | ORAL | Status: DC

## 2018-09-21 MED ORDER — INSULIN ASPART 100 UNIT/ML ~~LOC~~ SOLN
0.0000 [IU] | Freq: Three times a day (TID) | SUBCUTANEOUS | Status: DC
  Administered 2018-09-21 – 2018-09-22 (×2): 3 [IU] via SUBCUTANEOUS
  Administered 2018-09-22: 18:00:00 2 [IU] via SUBCUTANEOUS
  Administered 2018-09-22: 13:00:00 5 [IU] via SUBCUTANEOUS
  Administered 2018-09-23: 18:00:00 2 [IU] via SUBCUTANEOUS
  Administered 2018-09-23: 13:00:00 5 [IU] via SUBCUTANEOUS
  Administered 2018-09-23 – 2018-09-24 (×2): 2 [IU] via SUBCUTANEOUS
  Administered 2018-09-24 (×2): 3 [IU] via SUBCUTANEOUS
  Administered 2018-09-25: 12:00:00 5 [IU] via SUBCUTANEOUS
  Administered 2018-09-25: 18:00:00 2 [IU] via SUBCUTANEOUS
  Administered 2018-09-26 – 2018-09-27 (×3): 3 [IU] via SUBCUTANEOUS
  Administered 2018-09-27 (×2): 2 [IU] via SUBCUTANEOUS

## 2018-09-21 MED ORDER — BUPROPION HCL ER (XL) 150 MG PO TB24
150.0000 mg | ORAL_TABLET | Freq: Every day | ORAL | Status: DC
  Administered 2018-09-22 – 2018-09-28 (×7): 150 mg via ORAL
  Filled 2018-09-21 (×7): qty 1

## 2018-09-21 MED ORDER — INSULIN GLARGINE 100 UNIT/ML ~~LOC~~ SOLN
38.0000 [IU] | Freq: Every day | SUBCUTANEOUS | Status: DC
  Administered 2018-09-21 – 2018-09-24 (×4): 38 [IU] via SUBCUTANEOUS
  Filled 2018-09-21 (×5): qty 0.38

## 2018-09-21 MED ORDER — SPIRONOLACTONE 12.5 MG HALF TABLET
12.5000 mg | ORAL_TABLET | Freq: Every day | ORAL | Status: DC
  Administered 2018-09-21: 12.5 mg via ORAL
  Filled 2018-09-21: qty 1

## 2018-09-21 MED ORDER — DM-GUAIFENESIN ER 30-600 MG PO TB12
1.0000 | ORAL_TABLET | Freq: Two times a day (BID) | ORAL | Status: DC
  Administered 2018-09-21 – 2018-09-28 (×14): 1 via ORAL
  Filled 2018-09-21 (×14): qty 1

## 2018-09-21 MED ORDER — TORSEMIDE 20 MG PO TABS
20.0000 mg | ORAL_TABLET | Freq: Every day | ORAL | Status: DC
  Administered 2018-09-22 – 2018-09-23 (×2): 20 mg via ORAL
  Filled 2018-09-21 (×2): qty 1

## 2018-09-21 MED ORDER — SPIRONOLACTONE 12.5 MG HALF TABLET
12.5000 mg | ORAL_TABLET | Freq: Every day | ORAL | Status: DC
  Administered 2018-09-22 – 2018-09-24 (×3): 12.5 mg via ORAL
  Filled 2018-09-21 (×3): qty 1

## 2018-09-21 MED ORDER — ADULT MULTIVITAMIN W/MINERALS CH
1.0000 | ORAL_TABLET | Freq: Every day | ORAL | Status: DC
  Administered 2018-09-22 – 2018-09-28 (×7): 1 via ORAL
  Filled 2018-09-21 (×7): qty 1

## 2018-09-21 MED ORDER — CLOPIDOGREL BISULFATE 75 MG PO TABS
75.0000 mg | ORAL_TABLET | Freq: Every day | ORAL | Status: DC
  Administered 2018-09-22 – 2018-09-28 (×7): 75 mg via ORAL
  Filled 2018-09-21 (×7): qty 1

## 2018-09-21 MED ORDER — GABAPENTIN 300 MG PO CAPS
300.0000 mg | ORAL_CAPSULE | Freq: Every day | ORAL | Status: DC
  Administered 2018-09-22 – 2018-09-28 (×7): 300 mg via ORAL
  Filled 2018-09-21 (×7): qty 1

## 2018-09-21 NOTE — Progress Notes (Signed)
[name], MD  Physician  Physical Medicine and Rehabilitation  PMR Pre-admission  Signed  Date of Service:  09/21/2018 12:25 PM      Related encounter: Admission (Current) from 09/18/2018 in [facility]      Signed         Show:Clear all '[x]' Manual'[x]' Template'[x]' Copied  Added by: '[x]' Cristina Gong, RN'[x]' [name], MD  '[]' Hover for details PMR Admission Coordinator Pre-Admission Assessment  Patient: [name] [name] is an 73 y.o., female [name]: [id number] DOB: 01/04/1946 Height:   Weight: 108 kg  Insurance Information HMO:     PPO:      PCP:      IPA:      80/20:      OTHER: no HMO PRIMARY: Medicare a and b      Policy#: [id number]      Subscriber: pt Benefits:  Phone #: Passport one online     Name: 09/21/2018 Eff. Date: 12/02/2012     Deduct: $1408      Out of Pocket Max: none      Life Max: none CIR: 100%      SNF: has been out of SNF since 07/21/2018 where she has used 99 days since 04/10/2018. Was out of SNF 07/21/2018 at home so 49 days outside of acute hospital and SNF Outpatient: 80%   Co-Pay: 20% Home Health: 100%      Co-Pay: none DME: 80%     Co-Pay: 20% Providers: pt choice  SECONDARY: AARP      Policy#: [id number]      Subscriber: pt  Medicaid Application Date:       Case Manager:  Disability Application Date:       Case Worker:   The "Data Collection Information Summary" for patients in Inpatient Rehabilitation Facilities with attached "Privacy Act [place] Records" was provided and verbally reviewed with: Patient  Emergency Contact Information         Contact Information    Name Relation Home Work Mobile   [place] Spouse [phone]  [phone]   [facility] Daughter   [phone]      Current Medical History  Patient Admitting Diagnosis: Debility  History of Present Illness: 73 year old right-handed female history of hypertension, chronic back pain maintained on oxycodone as  needed, type 2 diabetes mellitus, hyperlipidemia, asthma using oxygen as needed at night, chronic diastolic congestive heart failure with ejection fraction 25%, CVA, CAD with CABG, atrial fibrillation maintained on Eliquis as well as Plavix followed by Dr. Loralie Champagne, CKD stage III, OSA not on CPAP. Presented 09/09/2018 with transient slurred speech, dizziness, vomiting as well as falls x3 without loss of consciousness. Noted bruises to left upper back and left leg. WBC 14,500, creatinine 1.13, lactic acid 1.2, COVID negative, urinalysis negative, potassium 3.3, low-grade fever 100.9, blood pressure 177/71. Chest x-ray showed cardiomegaly and vascular congestion with mild pulmonary edema as well as developing bronchopneumonia right lower lobe. MRSA PCR screening positive. X-rays of left femur left rib negative for fracture. CT of the head as well as MRI and MRA showed no acute ischemic infarct or intracranial hemorrhage. Severe intracranial atherosclerosis noted. Severe stenosis of left PCA P1 and P2 segments unchanged from compared to 06/01/2014. Severe stenosis of both internal carotid arteries at the skull base progressed compared to 06/01/2014. Neurology follow-up to address MRI suspect subacute to chronic right frontal lobe infarction new from prior scan of February 2020. It was advised to continue Eliquis and Plavix as prior to admission.  Patient was maintained on vancomycin and cefepime for fevers leukocytosis improved change to Levaquin 812 20203 doses. She did receive low-dose diuresis monitoring for signs of hypoxia with noted CO2 up to 42. Tolerating a regular diet. She was admitted to inpatient rehab services 09/14/2018.   Patient was slow but progressive gains. On 8/16-8/17/2020patient noted to be hypotensive with systolic's in the 90Z and diastolic in the 00P. Cardiology had initially been consulted 09/16/2018 and Demadex was held placed on IV fluids noted elevation in creatinine  from 1.688 1420 20-3.14 09/18/2018. She initially remained on Norvasc that have been decreased to 5 mg as well as losartan and Toprol.A follow-up echocardiogram showed ejection fraction of 50% mildly reduced systolic function. Patient received a total of 1 L IV fluids but continued to be hypotensive in addition to bradycardic heart rate ranging 30s to 40s as well as leukocytosis 17,800 and she was discharged to acute care services hospitalist team 09/18/2018. She was placed on dopamine drip initially hypotension suspect secondary to volume depletion from over diuresis and diuretics remained on hold. A cranial CT scan was completed due to some questionable increasing slurred speech that was unremarkable. Heart failure team continue to followwith medication changes as directed low-dose Demadex initiated. Creatinine rebounded nicely to 1.83 also monitoring of hypokalemia. She remained on Eliquis as well as Plavix for her atrial fibrillation. Blood pressure normalizing and therapies were reinitiated and patient tolerating nicely.   Patient's medical record from [facility]  has been reviewed by the rehabilitation admission coordinator and physician.  Past Medical History      Past Medical History:  Diagnosis Date  . Abnormality of gait 05/14/2014  . Anemia    hx  . Anginal pain ([place])   . Anxiety   . Asthma   . Basal cell carcinoma 05/2014   "left shoulder"  . Bundle branch block, left    chronic/notes 07/18/2013  . CHF (congestive heart failure) ([place])   . Chronic bronchitis ([place])    "off and on all the time" (07/18/2013)  . Chronic insomnia 05/06/2015  . Chronic kidney disease    frequency, sees dr Jamal Maes every 4 to 6 months (01/16/2018)  . Chronic low back pain 08/24/2016  . Chronic lower back pain   . Claustrophobia   . Common migraine 05/14/2014  . Coronary artery disease   . Depression   . Dysrhythmia   . GERD (gastroesophageal reflux disease)    . H/O hiatal hernia   . Headache    "at least 2/month" (01/16/2018)  . Heart murmur   . Hyperlipidemia   . Hypertension   . Irregular heart beat   . Leg cramps    both legs at times  . Melanoma ([place]) 05/2014   "burned off BLE" (01/16/2018)  . Memory change 05/14/2014  . Migraine    "5-6/year"  (01/16/2018)  . Myocardial infarction ([place]) 04/1999, 02/2000, 01/2005; 2011; 2014  . Neuromuscular disorder ([place])    ?  . Obesity 01-2010  . Obstructive sleep apnea    "can't wear machine; I have claustrophobia" (01/16/2018), states she had a 2nd sleep study and does not have sleep apnea, her O2 decreases and now is on 2 L of O2 at night.  . On home oxygen therapy    "2L at night and prn during daytime" (01/16/2018)  . Osteoarthritis    "knees and hands" (01/16/2018)  . Other and unspecified angina pectoris   . Peripheral vascular disease (HCC)    ? numbness,  tingling arms and legs  . Pneumonia 2000's   "once"  . PONV (postoperative nausea and vomiting)   . Shortness of breath    with exertion  . Stroke ([place]) 03/22/12   right side brain; denies residual on 07/18/2013)  . Stroke [facility]) Oct. 2015   Affected pt.s balance  . Stroke [facility]) 10/2014   "affected my legs; fully recovered now"; still have sporatic memory issues from this one" (01/16/2018)  . Type II diabetes mellitus ([place])   . Ventral hernia    hx of  . Vomiting    persistent  . Vomiting blood     Family History   family history includes AAA (abdominal aortic aneurysm) in her father and mother; Cancer in her sister; Deep vein thrombosis in her father and mother; Diabetes in her father, paternal aunt, paternal grandmother, paternal uncle, and paternal uncle; Heart attack in her father; Heart attack (age of onset: 7) in her paternal grandfather; Heart disease in her father and paternal uncle; Hyperlipidemia in her father; Hypertension in her father, mother, and sister.  Prior  Rehab/Hospitalizations Has the patient had prior rehab or hospitalizations prior to admission? Yes   SNF at [facility] in [place] 04/09/29 until 05/16/2018. At home after d/c as she got out of car, fractured ankle. Returned to SNF until 07/21/2018 when she returned home with no weight bearing restrictions. She used 99 days of SNF benefit.  Has the patient had major surgery during 100 days prior to admission? No              Current Medications  Current Facility-Administered Medications:  .  0.9 %  sodium chloride infusion, , Intravenous, PRN, Shellia Cleverly, MD, Stopped at 09/20/18 0825 .  albuterol (PROVENTIL) (2.5 MG/3ML) 0.083% nebulizer solution 2.5 mg, 2.5 mg, Nebulization, Q6H PRN, Adhikari, Amrit, MD .  ALPRAZolam Duanne Moron) tablet 0.5 mg, 0.5 mg, Oral, BID PRN, Jeannene Patella, Sunil G, MD, 0.5 mg at 09/20/18 2206 .  apixaban (ELIQUIS) tablet 5 mg, 5 mg, Oral, BID, Shellia Cleverly, MD, 5 mg at 09/21/18 3785 .  buPROPion (WELLBUTRIN XL) 24 hr tablet 150 mg, 150 mg, Oral, Daily, Shellia Cleverly, MD, 150 mg at 09/21/18 0920 .  Chlorhexidine Gluconate Cloth 2 % PADS 6 each, 6 each, Topical, Q0600, Gleason, Otilio Carpen, PA-C, 6 each at 09/21/18 0534 .  clopidogrel (PLAVIX) tablet 75 mg, 75 mg, Oral, Daily, Shellia Cleverly, MD, 75 mg at 09/21/18 0920 .  dextromethorphan-guaiFENesin (MUCINEX DM) 30-600 MG per 12 hr tablet 1 tablet, 1 tablet, Oral, BID, Shellia Cleverly, MD, 1 tablet at 09/21/18 0920 .  DULoxetine (CYMBALTA) DR capsule 30 mg, 30 mg, Oral, Daily, Shellia Cleverly, MD, 30 mg at 09/21/18 8850 .  gabapentin (NEURONTIN) capsule 300 mg, 300 mg, Oral, Daily, Gleason, Otilio Carpen, PA-C, 300 mg at 09/21/18 0920 .  insulin aspart (novoLOG) injection 0-15 Units, 0-15 Units, Subcutaneous, TID WC, Shellia Cleverly, MD, 3 Units at 09/21/18 1215 .  insulin glargine (LANTUS) injection 38 Units, 38 Units, Subcutaneous, QHS, Gleason, Otilio Carpen, PA-C, 38 Units at 09/20/18 2207 .  metoprolol succinate  (TOPROL-XL) 24 hr tablet 50 mg, 50 mg, Oral, Daily, Adhikari, Amrit, MD, 50 mg at 09/21/18 0920 .  multivitamin with minerals tablet 1 tablet, 1 tablet, Oral, Daily, Shelly Coss, MD, 1 tablet at 09/21/18 0921 .  mupirocin ointment (BACTROBAN) 2 % 1 application, 1 application, Nasal, BID, Gleason, Otilio Carpen, PA-C, 1 application at 27/74/12 8786 .  ondansetron (  ZOFRAN) injection 4 mg, 4 mg, Intravenous, Q8H PRN, Kamat, Sunil G, MD .  protein supplement (ENSURE MAX) liquid, 11 oz, Oral, Daily, Adhikari, Amrit, MD, 11 oz at 09/20/18 2100 .  rosuvastatin (CRESTOR) tablet 40 mg, 40 mg, Oral, q1800, Shellia Cleverly, MD, 40 mg at 09/20/18 1700 .  sodium chloride flush (NS) 0.9 % injection 10-40 mL, 10-40 mL, Intracatheter, Q12H, Chesley Mires, MD, 10 mL at 09/21/18 0923 .  sodium chloride flush (NS) 0.9 % injection 10-40 mL, 10-40 mL, Intracatheter, PRN, Chesley Mires, MD, 20 mL at 09/18/18 2100 .  spironolactone (ALDACTONE) tablet 12.5 mg, 12.5 mg, Oral, Daily, Adhikari, Amrit, MD, 12.5 mg at 09/21/18 1211 .  torsemide (DEMADEX) tablet 20 mg, 20 mg, Oral, Daily, Bensimhon, Shaune Pascal, MD, 20 mg at 09/21/18 1033  Patients Current Diet:     Diet Order                  Diet - low sodium heart healthy         Diet Carb Modified Fluid consistency: Thin; Room service appropriate? Yes  Diet effective now               Precautions / Restrictions Precautions Precautions: Fall Precaution Comments: hx of falls at home (~8 in last 6 months) Restrictions Weight Bearing Restrictions: No   Has the patient had 2 or more falls or a fall with injury in the past year? Yes Has the patient had 2 or more falls or a fall with injury in the past year?YesPatient reports 4 falls since d/c from [facility] 07/21/2018. She has issues with low heart rate and BP at times  Prior Activity Level Limited Community (1-2x/wk): decline in function since [place]  Prior Functional Level Self Care: Did the patient  need help bathing, dressing, using the toilet or eating? Needed some help  Indoor Mobility: Did the patient need assistance with walking from room to room (with or without device)? Independent  Stairs: Did the patient need assistance with internal or external stairs (with or without device)? Needed some help  Functional Cognition: Did the patient need help planning regular tasks such as shopping or remembering to take medications? Independent  Home Assistive Devices / Equipment Home Assistive Devices/Equipment: Gilford Rile (specify type) Home Equipment: Walker - 2 wheels, Shower seat  Prior Device Use: Indicate devices/aids used by the patient prior to current illness, exacerbation or injury? Walker  Current Functional Level Cognition  Arousal/Alertness: Awake/alert Overall Cognitive Status: Within Functional Limits for tasks assessed Orientation Level: Oriented X4 Memory: Appears intact    Extremity Assessment (includes Sensation/Coordination)  Upper Extremity Assessment: Generalized weakness  Lower Extremity Assessment: Defer to PT evaluation    ADLs  Overall ADL's : Needs assistance/impaired Eating/Feeding: Independent, Sitting Grooming: Oral care, Wash/dry hands, Wash/dry face, Sitting, Set up Upper Body Bathing: Minimal assistance, Sitting Lower Body Bathing: Minimal assistance, Sit to/from stand Upper Body Dressing : Minimal assistance, Sitting Lower Body Dressing: Minimal assistance, Sit to/from stand Toilet Transfer: Minimal assistance, Ambulation, RW Functional mobility during ADLs: Minimal assistance, +2 for safety/equipment, Rolling walker    Mobility  Overal bed mobility: Needs Assistance Bed Mobility: Supine to Sit Supine to sit: Min assist, HOB elevated General bed mobility comments: pt received in chair    Transfers  Overall transfer level: Needs assistance Equipment used: Rolling walker (2 wheeled) Transfers: Sit to/from Stand, [employer]  Transfers Sit to Stand: Min assist Stand pivot transfers: Min assist General transfer comment: steadying assist, slow to  rise, good technique    Ambulation / Gait / Stairs / Emergency planning/management officer  Ambulation/Gait General Gait Details: did not attempt    Posture / Balance Dynamic Sitting Balance Sitting balance - Comments: no LOB with donning socks Balance Overall balance assessment: Needs assistance, History of Falls Sitting-balance support: No upper extremity supported, Feet supported Sitting balance-Leahy Scale: Good Sitting balance - Comments: no LOB with donning socks Standing balance support: Bilateral upper extremity supported, During functional activity Standing balance-Leahy Scale: Poor Standing balance comment: walker and min assist for static standing. Initial posterior lean in standing    Special needs/care consideration BiPAP/CPAP OSA but not tolerant of CPAP CPM  N/a Continuous Drip IV  N/a Dialysis n/a Life Vest  N/a Oxygen  N/a Special Bed  N/a Trach Size  N/a Wound Vac n/a Skin abrasion and ecchymosis to right buttocks. Stage II; ecchymosis to abdomen, arms and back bilaterally; eczema left elbow; MASD buttocks Bowel mgmt:  LBM 8/19 continent Bladder mgmt:  external catheter Diabetic mgmt:  N/a Behavioral consideration  N/a Chemo/radiation  N/a Spouse, [name] is Media planner   Previous Home Environment  Living Arrangements: Spouse/significant other  Lives With: Spouse Available Help at Discharge: Family, Available 24 hours/day Type of Home: House Home Layout: Two level, 1/2 bath on main level, Bed/bath upstairs Alternate Level Stairs-Rails: Can reach both Alternate Level Stairs-Number of Steps: has a stair lift Home Access: Stairs to enter Entrance Stairs-Rails: None Entrance Stairs-Number of Steps: 1 Bathroom Shower/Tub: Multimedia programmer: Handicapped height Bathroom Accessibility: Yes How Accessible: Accessible via walker [place]: Yes Type of Home Care Services: Home RN, [place] (if known): [facility] Additional Comments: pt hopeful to complete her rehab on CIR  Discharge Living Setting Plans for Discharge Living Setting: Patient's home, Lives with (comment) Type of Home at Discharge: House Discharge Home Layout: Two level, 1/2 bath on main level, Bed/bath upstairs Alternate Level Stairs-Rails: Right, Left, Can reach both Alternate Level Stairs-Number of Steps: flight/has chair lift Discharge Home Access: Level entry Discharge Bathroom Shower/Tub: Walk-in shower Discharge Bathroom Toilet: Standard Discharge Bathroom Accessibility: Yes How Accessible: Accessible via walker Does the patient have any problems obtaining your medications?: No  Social/Family/Support Systems Patient Roles: Spouse, Parent Contact Information: spouse , [name] Anticipated Caregiver: [name] and daughter, Danton Clap Anticipated Ambulance person Information: see above Ability/Limitations of Caregiver: spouse has own buisiness, lift for vehicles and dtr is RN with [employer]; they arrange 24/7 assist between them Caregiver Availability: 24/7 Discharge Plan Discussed with Primary Caregiver: Yes Is Caregiver In Agreement with Plan?: Yes Does Caregiver/Family have Issues with Lodging/Transportation while Pt is in Rehab?: No  Goals/Additional Needs Patient/Family Goal for Rehab: supervision PT, supervision to [place] OT Expected length of stay: ELOS 5 to 7 days Special Service Needs: Spouse, [name] is visitor designee Pt/Family Agrees to Admission and willing to participate: Yes Program Orientation Provided & Reviewed with Pt/Caregiver Including Roles  & Responsibilities: Yes  Decrease burden of Care through IP rehab admission: n/a  Possible need for SNF placement upon discharge:  Not anticipated  Patient Condition: I have reviewed medical records from [facility] , spoken with CM, and patient. I met with  patient at the bedside for inpatient rehabilitation assessment.  Patient will benefit from ongoing PT and OT, can actively participate in 3 hours of therapy a day 5 days of the week, and can make measurable gains during the admission.  Patient will also benefit from the coordinated team approach  during an Inpatient Acute Rehabilitation admission.  The patient will receive intensive therapy as well as Rehabilitation physician, nursing, social worker, and care management interventions.  Due to bladder management, bowel management, safety, skin/wound care, disease management, medication administration, pain management and patient education the patient requires 24 hour a day rehabilitation nursing.  The patient is currently min assist with mobility and basic ADLs.  Discharge setting and therapy post discharge at home with home health is anticipated.  Patient has agreed to participate in the Acute Inpatient Rehabilitation Program and will admit today.  Preadmission Screen Completed By:  Cleatrice Burke, 09/21/2018 12:35 PM ______________________________________________________________________   Discussed status with Dr. Naaman Plummer  on 09/21/2018 at 1235 and received approval for admission today.  Admission Coordinator:  Cleatrice Burke, RN, time 4370 Date  09/21/2018   Assessment/Plan: Diagnosis: debility 1. Does the need for close, 24 hr/day Medical supervision in concert with the patient's rehab needs make it unreasonable for this patient to be served in a less intensive setting? Yes 2. Co-Morbidities requiring supervision/potential complications: CHF, a fib, volume depletion, htn, prior cva 3. Due to bladder management, bowel management, safety, skin/wound care, disease management, medication administration, pain management and patient education, does the patient require 24 hr/day rehab nursing? Yes 4. Does the patient require coordinated care of a physician, rehab nurse, PT (1-2 hrs/day, 5  days/week) and OT (1-2 hrs/day, 5 days/week) to address physical and functional deficits in the context of the above medical diagnosis(es)? Yes Addressing deficits in the following areas: balance, endurance, locomotion, strength, transferring, bowel/bladder control, bathing, dressing, feeding, grooming, toileting and psychosocial support 5. Can the patient actively participate in an intensive therapy program of at least 3 hrs of therapy 5 days a week? Yes 6. The potential for patient to make measurable gains while on inpatient rehab is excellent 7. Anticipated functional outcomes upon discharge from inpatients are: modified independent PT, modified independent OT, n/a SLP 8. Estimated rehab length of stay to reach the above functional goals is: 5-7 days 9. Anticipated D/C setting: Home 10. Anticipated post D/C treatments: [place] therapy 11. Overall Rehab/Functional Prognosis: excellent  MD Signature: [name], MD, [facility] 09/21/2018         Revision History

## 2018-09-21 NOTE — Progress Notes (Signed)
Patient ID: [name] [name], female   DOB: December 11, 1945, 73 y.o.   [name]: [id number] Readmitted to CIR from [phone]. New Care Plan started and reoriented to unit process. Patient resting comfortably in bed with call bell at side.

## 2018-09-21 NOTE — Progress Notes (Addendum)
Inpatient Rehabilitation Admissions Coordinator  Pt to be readmitted to CIR today. I met with patient at bedside and she Is in agreement. RN CM , Carmelita is aware. I will make the arrangements to admit today.  I have notified Dr. Haroldine Laws of admission.  Danne Baxter, RN, MSN Rehab Admissions Coordinator [phone] 09/21/2018 12:02 PM

## 2018-09-21 NOTE — H&P (Signed)
Physical Medicine and Rehabilitation Admission H&P       HPI: [name] [name] is a 73 year old right-handed female history of hypertension, chronic back pain maintained on oxycodone as needed, type 2 diabetes mellitus, hyperlipidemia, asthma using oxygen as needed at night, chronic diastolic congestive heart failure with ejection fraction 25%, CVA, CAD with CABG, atrial fibrillation maintained on Eliquis as well as Plavix followed by Dr. Hildred Priest, CKD stage III, OSA not on CPAP.  Per chart review she lives with spouse used a rolling walker for mobility reports multiple falls.  Husband and daughter assist as needed.  Patient had been at a skilled nursing facility until end of July for bouts of pneumonia.  They live in a two-level home with bed and bathroom upstairs.  Presented 09/09/2018 with transient slurred speech, dizziness, vomiting as well as falls x3 without loss of consciousness.  Noted bruises to left upper back and left leg.  WBC 14,500, creatinine 1.13, lactic acid 1.2, COVID negative, urinalysis negative, potassium 3.3, low-grade fever 100.9, blood pressure 177/71.  Chest x-ray showed cardiomegaly and vascular congestion with mild pulmonary edema as well as developing bronchopneumonia right lower lobe.  MRSA PCR screening positive.  X-rays of left femur left rib negative for fracture.  CT of the head as well as MRI and MRA showed no acute ischemic infarct or intracranial hemorrhage.  Severe intracranial atherosclerosis noted.  Severe stenosis of left PCA P1 and P2 segments unchanged from compared to 06/01/2014.  Severe stenosis of both internal carotid arteries at the skull base progressed compared to 06/01/2014.  Neurology follow-up to address MRI suspect subacute to chronic right frontal lobe infarction new from prior scan of February 2020.  It was advised to continue Eliquis and Plavix as prior to admission.  Patient was maintained on vancomycin and cefepime for fevers leukocytosis  improved change to Levaquin 812 20203 doses.  She did receive low-dose diuresis monitoring for signs of hypoxia with noted CO2 up to 42.  Tolerating a regular diet.  She was admitted to inpatient rehab services 09/14/2018.  Patient was slow but progressive gains.  On 8/16-8/17/2020 patient noted to be hypotensive with systolics in the 71G and diastolic in the 62I.  Cardiology had initially been consulted 09/16/2018 and Demadex was held placed on IV fluids noted elevation in creatinine from 1.688 1420 20-3.14 09/18/2018.  She initially remained on Norvasc that have been decreased to 5 mg as well as losartan and Toprol.  A follow-up echocardiogram showed ejection fraction of 50% mildly reduced systolic function.  Patient received a total of 1 L IV fluids but continued to be hypotensive in addition to bradycardic heart rate ranging 30s to 40s as well as leukocytosis 17,800 and she was discharged to acute care services hospitalist team 09/18/2018.  She was placed on dopamine drip initially hypotension suspect secondary to volume depletion from overdiuresis and diuretics remained on hold.  A cranial CT scan was completed due to some questionable increasing slurred speech that was unremarkable.  Heart failure team continue to follow with medication changes as directed low-dose Demadex initiated.  Creatinine rebounded nicely to 1.83 also monitoring of hypokalemia.  She remained on Eliquis as well as Plavix for her atrial fibrillation.  Blood pressure  normalizing and therapies were reinitiated and patient tolerating nicely.  She is readmitted back to inpatient rehab services for comprehensive therapy   Review of Systems  Constitutional: Negative for fever.  HENT: Negative for hearing loss.   Eyes: Negative  for blurred vision.  Respiratory: Positive for shortness of breath. Negative for cough.   Cardiovascular: Positive for leg swelling. Negative for chest pain.       Anginal pain  Gastrointestinal: Positive for  constipation. Negative for heartburn, nausea and vomiting.       GERD  Genitourinary: Negative for dysuria, flank pain and hematuria.  Musculoskeletal: Positive for back pain, joint pain and myalgias.  Skin: Negative for rash.  Neurological: Positive for dizziness and headaches.  Psychiatric/Behavioral: Positive for depression. The patient has insomnia.        Anxiety  All other systems reviewed and are negative.       Past Medical History:  Diagnosis Date   Abnormality of gait 05/14/2014   Anemia      hx   Anginal pain (HCC)     Anxiety     Asthma     Basal cell carcinoma 05/2014    "left shoulder"   Bundle branch block, left      chronic/notes 07/18/2013   CHF (congestive heart failure) ([place])     Chronic bronchitis ([place])      "off and on all the time" (07/18/2013)   Chronic insomnia 05/06/2015   Chronic kidney disease      frequency, sees dr Jamal Maes every 4 to 6 months (01/16/2018)   Chronic low back pain 08/24/2016   Chronic lower back pain     Claustrophobia     Common migraine 05/14/2014   Coronary artery disease     Depression     Dysrhythmia     GERD (gastroesophageal reflux disease)     H/O hiatal hernia     Headache      "at least 2/month" (01/16/2018)   Heart murmur     Hyperlipidemia     Hypertension     Irregular heart beat     Leg cramps      both legs at times   Melanoma ([place]) 05/2014    "burned off BLE" (01/16/2018)   Memory change 05/14/2014   Migraine      "5-6/year"  (01/16/2018)   Myocardial infarction ([place]) 04/1999, 02/2000, 01/2005; 2011; 2014   Neuromuscular disorder ([place])      ?   Obesity 01-2010   Obstructive sleep apnea      "can't wear machine; I have claustrophobia" (01/16/2018), states she had a 2nd sleep study and does not have sleep apnea, her O2 decreases and now is on 2 L of O2 at night.   On home oxygen therapy      "2L at night and prn during daytime" (01/16/2018)   Osteoarthritis       "knees and hands" (01/16/2018)   Other and unspecified angina pectoris     Peripheral vascular disease ([place])      ? numbness, tingling arms and legs   Pneumonia 2000's    "once"   PONV (postoperative nausea and vomiting)     Shortness of breath      with exertion   Stroke ([place]) 03/22/12    right side brain; denies residual on 07/18/2013)   Stroke [facility]) Oct. 2015    Affected pt.s balance   Stroke ([place]) 10/2014    "affected my legs; fully recovered now"; still have sporatic memory issues from this one" (01/16/2018)   Type II diabetes mellitus (HCC)     Ventral hernia      hx of   Vomiting      persistent   Vomiting blood  Past Surgical History:  Procedure Laterality Date   ANKLE FRACTURE SURGERY Left 1970's   APPENDECTOMY   1970's    w/hysterectomy   BASAL CELL CARCINOMA EXCISION Left 05/2014    "shoulder" (01/16/2018)   CARDIAC CATHETERIZATION   10/10/2012    Dr [name].   CARDIAC CATHETERIZATION N/A 05/29/2015    Procedure: Right/Left Heart Cath and Coronary/Graft Angiography;  Surgeon: Larey Dresser, MD;  Location: [place] [facility];  Service: Cardiovascular;  Laterality: N/A;   CAROTID ENDARTERECTOMY Left 03/2013   CAROTID STENT INSERTION Left 03/20/2013    Procedure: CAROTID STENT INSERTION;  Surgeon: [name], MD;  Location: [facility] CATH LAB;  Service: Cardiovascular;  Laterality: Left;  internal carotid   CEREBRAL ANGIOGRAM N/A 04/05/2011    Procedure: CEREBRAL ANGIOGRAM;  Surgeon: Angelia Mould, MD;  Location: [facility] CATH LAB;  Service: Cardiovascular;  Laterality: N/A;   CHOLECYSTECTOMY OPEN   2004   CORONARY ANGIOPLASTY WITH STENT PLACEMENT   01,02,05,06,07,08,11; 04/24/2013    "I've probably got ~ 10 stents by now" (04/24/2013)   CORONARY ANGIOPLASTY WITH STENT PLACEMENT   06/13/2013    "got 4 stents today" (06/13/2013)   CORONARY ARTERY BYPASS GRAFT   1220/11    "CABG X5"   CORONARY STENT INTERVENTION N/A 10/20/2017    Procedure:  CORONARY STENT INTERVENTION;  Surgeon: Troy Sine, MD;  Location: [place] [facility];  Service: Cardiovascular;  Laterality: N/A;   ESOPHAGOGASTRODUODENOSCOPY   08/03/2011    Procedure: ESOPHAGOGASTRODUODENOSCOPY (EGD);  Surgeon: Shann Medal, MD;  Location: [facility];  Service: General;  Laterality: N/A;   ESOPHAGOGASTRODUODENOSCOPY (EGD) WITH PROPOFOL N/A 03/11/2014    Procedure: ESOPHAGOGASTRODUODENOSCOPY (EGD) WITH PROPOFOL;  Surgeon: Lafayette Dragon, MD;  Location: [facility];  Service: Endoscopy;  Laterality: N/A;   FRACTURE SURGERY       gall stone removal   05/2003   GASTRIC RESTRICTION SURGERY   1984    "stapeling"   HERNIA REPAIR   2004    "in my stomach; had OR on it twice", wire mesh on 1 hernia   LEFT HEART CATH AND CORS/GRAFTS ANGIOGRAPHY N/A 07/09/2016    Procedure: LEFT HEART CATH AND CORS/GRAFTS ANGIOGRAPHY;  Surgeon: Larey Dresser, MD;  Location: [place] [facility];  Service: Cardiovascular;  Laterality: N/A;   ORIF ANKLE FRACTURE Left 05/16/2018   ORIF ANKLE FRACTURE Left 05/16/2018    Procedure: OPEN REDUCTION INTERNAL FIXATION (ORIF) Left ankle with possible syndesmosis fixation;  Surgeon: Nicholes Stairs, MD;  Location: [place];  Service: Orthopedics;  Laterality: Left;  168min   OVARY SURGERY   1970's    "tumor removed"   PERCUTANEOUS CORONARY STENT INTERVENTION (PCI-S) N/A 06/13/2013    Procedure: PERCUTANEOUS CORONARY STENT INTERVENTION (PCI-S);  Surgeon: [name], MD;  Location: [facility] CATH LAB;  Service: Cardiovascular;  Laterality: N/A;   PERCUTANEOUS STENT INTERVENTION N/A 04/24/2013    Procedure: PERCUTANEOUS STENT INTERVENTION;  Surgeon: [name], MD;  Location: [facility] CATH LAB;  Service: Cardiovascular;  Laterality: N/A;   RIGHT/LEFT HEART CATH AND CORONARY/GRAFT ANGIOGRAPHY N/A 10/20/2017    Procedure: RIGHT/LEFT HEART CATH AND CORONARY/GRAFT ANGIOGRAPHY;  Surgeon: Larey Dresser, MD;  Location: [place] [facility];  Service:  Cardiovascular;  Laterality: N/A;   ROOT CANAL   10/2000   TIBIA FRACTURE SURGERY Right 1970's    rods and pins   TOOTH EXTRACTION        "1 on the upper; wisdom tooth on the lower" (  01/16/2018)   TOTAL ABDOMINAL HYSTERECTOMY   1970's    w/ appendectomy        Family History  Problem Relation Age of Onset   Heart disease Father          Heart Disease before age 59   Diabetes Father     Hyperlipidemia Father     Hypertension Father     Heart attack Father     Deep vein thrombosis Father     AAA (abdominal aortic aneurysm) Father     Hypertension Mother     Deep vein thrombosis Mother     AAA (abdominal aortic aneurysm) Mother     Cancer Sister          Ovarian   Hypertension Sister     Diabetes Paternal Grandmother     Heart disease Paternal Uncle     Diabetes Paternal Uncle     Heart attack Paternal Grandfather 56        died of MI at 69   Diabetes Paternal Aunt     Diabetes Paternal Uncle     Colon cancer Neg Hx     Social History:  reports that she has never smoked. She has never used smokeless tobacco. She reports that she does not drink alcohol or use drugs. Allergies:       Allergies  Allergen Reactions   Amoxicillin Shortness Of Breath and Rash   Brilinta [Ticagrelor] Shortness Of Breath   Erythromycin Shortness Of Breath, Other (See Comments) and Hives      Trouble swallowing   Flagyl [Metronidazole] Shortness Of Breath and Palpitations   Penicillins Hives, Shortness Of Breath, Rash and Other (See Comments)      Has patient had a PCN reaction causing immediate rash, facial/tongue/throat swelling, SOB or lightheadedness with hypotension: Yes Has patient had a PCN reaction causing severe rash involving mucus membranes or skin necrosis: No Has patient had a PCN reaction that required hospitalization: Yes Has patient had a PCN reaction occurring within the last 10 years: No If all of the above answers are "NO", then may proceed with  Cephalosporin use.     Isosorbide Mononitrate [Isosorbide Nitrate] Other (See Comments)      Joint aches, muscles hurt, difficult to walk     Jardiance [Empagliflozin] Other (See Comments)      Nausea, joint aches, muscles aches   Metformin And Related Other (See Comments)      Stomach pain, cold sweats, joint pain, burred vision, dizziness   Tape Other (See Comments)      Skin pulls off with certain types Plastic tape causes skin to rip if left on for long periods of time   Erythromycin Base Rash         Medications Prior to Admission  Medication Sig Dispense Refill   ADVAIR HFA 115-21 MCG/ACT inhaler Inhale 2 puffs into the lungs.       albuterol (VENTOLIN HFA) 108 (90 Base) MCG/ACT inhaler Inhale 2 puffs into the lungs every 6 (six) hours as needed for wheezing.       Alirocumab (PRALUENT) 150 MG/ML SOAJ Inject 1 pen into the skin every 14 (fourteen) days. 2 pen 11   ALPRAZolam (XANAX) 0.5 MG tablet Take 1 tablet (0.5 mg total) by mouth 2 (two) times daily. 60 tablet 0   amLODipine (NORVASC) 10 MG tablet Take 10 mg by mouth daily.       apixaban (ELIQUIS) 5 MG TABS tablet Take 1 tablet (5 mg total)  by mouth 2 (two) times daily. 60 tablet 6   bisacodyl (DULCOLAX) 5 MG EC tablet Take 1 tablet (5 mg total) by mouth daily as needed for moderate constipation. 30 tablet 1   budesonide (PULMICORT) 0.25 MG/2ML nebulizer solution Take 2 mLs (0.25 mg total) by nebulization 2 (two) times daily. 60 mL 12   buPROPion (WELLBUTRIN XL) 150 MG 24 hr tablet Take 150 mg by mouth daily.       clopidogrel (PLAVIX) 75 MG tablet Take 1 tablet (75 mg total) by mouth at bedtime. 90 tablet 1   denosumab (PROLIA) 60 MG/ML SOSY injection Inject 69 mg into the skin every 6 (six) months.       DULoxetine (CYMBALTA) 30 MG capsule Take 1 capsule (30 mg total) by mouth daily.   3   ezetimibe (ZETIA) 10 MG tablet Take 1 tablet (10 mg total) by mouth at bedtime.       gabapentin (NEURONTIN) 300 MG  capsule Take 2 capsules (600 mg total) by mouth 2 (two) times daily.       insulin aspart (NOVOLOG) 100 UNIT/ML injection Inject 8 Units into the skin 3 (three) times daily with meals. (Patient taking differently: Inject 20 Units into the skin 3 (three) times daily. ) 10 mL 11   insulin glargine (LANTUS) 100 UNIT/ML injection Inject 0.35 mLs (35 Units total) into the skin at bedtime. (Patient taking differently: Inject 38 Units into the skin at bedtime. ) 10 mL 11   ipratropium-albuterol (DUONEB) 0.5-2.5 (3) MG/3ML SOLN Take 3 mLs by nebulization 2 (two) times daily. (Patient taking differently: Take 3 mLs by nebulization 2 (two) times daily as needed (shortness of breath). ) 360 mL     metolazone (ZAROXOLYN) 2.5 MG tablet Take 2.5 mg by mouth daily.       metoprolol succinate (TOPROL-XL) 50 MG 24 hr tablet Take 25mg  daily with or immediately following a meal.       Multiple Vitamins-Minerals (HEALTHY EYES/LUTEIN) TABS Take 1 tablet by mouth daily.       nitroGLYCERIN (NITROSTAT) 0.3 MG SL tablet Place 1 tablet (0.3 mg total) under the tongue every 5 (five) minutes x 3 doses as needed for chest pain. 9 tablet 1   ondansetron (ZOFRAN) 4 MG tablet Take 4 mg by mouth every 6 (six) hours as needed for nausea/vomiting.       oxyCODONE (OXY IR/ROXICODONE) 5 MG immediate release tablet Take 1 tablet (5 mg total) by mouth every 4 (four) hours as needed for moderate pain or severe pain. (Patient not taking: Reported on 09/09/2018) 40 tablet 0   OXYGEN Inhale 2 L into the lungs at bedtime.        pantoprazole (PROTONIX) 40 MG tablet Take 1 tablet (40 mg total) by mouth daily.       potassium chloride SA (K-DUR) 20 MEQ tablet TAKE ONE (1) TABLET BY MOUTH TWO (2) TIMES DAILY 60 tablet 1   ranolazine (RANEXA) 1000 MG SR tablet Take 1,000 mg by mouth every 12 (twelve) hours.       rosuvastatin (CRESTOR) 40 MG tablet Take 1 tablet (40 mg total) by mouth at bedtime.       spironolactone (ALDACTONE) 25 MG  tablet Take 1 tablet (25 mg total) by mouth daily. (Patient not taking: Reported on 09/09/2018)       torsemide (DEMADEX) 20 MG tablet Take 1 tablet (20 mg total) by mouth every other day.       traMADol (ULTRAM) 50 MG  tablet Take 1 tablet (50 mg total) by mouth every 6 (six) hours as needed for moderate pain. (Patient not taking: Reported on 09/09/2018) 40 tablet 0   triamcinolone cream (KENALOG) 0.1 % Apply 1 application topically daily.       vitamin B-12 (CYANOCOBALAMIN) 1000 MCG tablet Take 1,000 mcg by mouth daily.       zolpidem (AMBIEN) 10 MG tablet Take 10 mg by mouth every evening.         Drug Regimen Review Drug regimen was reviewed and remains appropriate with no significant issues identified   Home: Home Living Family/patient expects to be discharged to:: Private residence Living Arrangements: Spouse/significant other Available Help at Discharge: Family(spouse and daughter) Type of Home: House Home Access: Stairs to enter Technical brewer of Steps: 1 Entrance Stairs-Rails: None Home Layout: Two level, 1/2 bath on main level, Bed/bath upstairs Alternate Level Stairs-Number of Steps: has a stair lift Alternate Level Stairs-Rails: Can reach both Bathroom Shower/Tub: Multimedia programmer: Handicapped height Bathroom Accessibility: Yes Home Equipment: Environmental consultant - 2 wheels, Shower seat Additional Comments: pt hopeful to complete her rehab on CIR  Lives With: Spouse   Functional History: Prior Function Level of Independence: Needs assistance Gait / Transfers Assistance Needed: amb with rolling walker ADL's / Homemaking Assistance Needed: husband dries her back side and supervises shower transfer, pt can dress, husband does cooking and pt has a Engineer, structural Status:  Mobility: Bed Mobility Overal bed mobility: Needs Assistance Bed Mobility: Supine to Sit Supine to sit: Min assist, HOB elevated General bed mobility comments: pt received in  chair Transfers Overall transfer level: Needs assistance Equipment used: Rolling walker (2 wheeled) Transfers: Sit to/from Stand, [employer] Transfers Sit to Stand: Min assist Stand pivot transfers: Min assist General transfer comment: steadying assist, slow to rise, good technique Ambulation/Gait General Gait Details: did not attempt     ADL: ADL Overall ADL's : Needs assistance/impaired Eating/Feeding: Independent, Sitting Grooming: Oral care, Wash/dry hands, Wash/dry face, Sitting, Set up Upper Body Bathing: Minimal assistance, Sitting Lower Body Bathing: Minimal assistance, Sit to/from stand Upper Body Dressing : Minimal assistance, Sitting Lower Body Dressing: Minimal assistance, Sit to/from stand Toilet Transfer: Minimal assistance, Ambulation, RW Functional mobility during ADLs: Minimal assistance, +2 for safety/equipment, Rolling walker   Cognition: Cognition Overall Cognitive Status: Within Functional Limits for tasks assessed Orientation Level: (P) Oriented X4(Confused when waking up to use commode) Cognition Arousal/Alertness: Awake/alert Behavior During Therapy: WFL for tasks assessed/performed Overall Cognitive Status: Within Functional Limits for tasks assessed   Physical Exam: Blood pressure (!) 180/66, pulse 95, temperature (!) 97.5 F (36.4 C), temperature source Oral, resp. rate 12, weight 108 kg, SpO2 100 %. Physical Exam  Constitutional: No distress.  HENT:  Head: Normocephalic and atraumatic.  Eyes: Pupils are equal, round, and reactive to light. EOM are normal.  Neck: Normal range of motion. No thyromegaly present.  Cardiovascular: Normal rate, regular rhythm and normal heart sounds.  Respiratory: Effort normal and breath sounds normal. No respiratory distress. She has no wheezes. She has no rales.  GI: Soft. She exhibits no distension. There is no abdominal tenderness.  Musculoskeletal: Normal range of motion.        General: No edema.   Neurological:  Patient is alert sitting at edge of bed.  Makes good eye contact with examiner she follows full commands.  Mood is appropriate.  Skin: She is not diaphoretic.  A few scattered abrasions, skin tears along LE's.  Psychiatric: She has a normal mood and affect. Her behavior is normal.      Lab Results Last 48 Hours  Results for orders placed or performed during the hospital encounter of 09/18/18 (from the past 48 hour(s))  Glucose, capillary     Status: Abnormal    Collection Time: 09/19/18  7:35 AM  Result Value Ref Range    Glucose-Capillary 159 (H) 70 - 99 mg/dL  Glucose, capillary     Status: Abnormal    Collection Time: 09/19/18 12:14 PM  Result Value Ref Range    Glucose-Capillary 205 (H) 70 - 99 mg/dL  Glucose, capillary     Status: Abnormal    Collection Time: 09/19/18  4:18 PM  Result Value Ref Range    Glucose-Capillary 173 (H) 70 - 99 mg/dL  Glucose, capillary     Status: Abnormal    Collection Time: 09/19/18  9:43 PM  Result Value Ref Range    Glucose-Capillary 222 (H) 70 - 99 mg/dL  Renal function panel     Status: Abnormal    Collection Time: 09/20/18  3:49 AM  Result Value Ref Range    Sodium 136 135 - 145 mmol/L    Potassium 2.8 (L) 3.5 - 5.1 mmol/L    Chloride 96 (L) 98 - 111 mmol/L    CO2 29 22 - 32 mmol/L    Glucose, Bld 132 (H) 70 - 99 mg/dL    BUN 32 (H) 8 - 23 mg/dL    Creatinine, Ser 1.83 (H) 0.44 - 1.00 mg/dL      Comment: DELTA CHECK NOTED    Calcium 9.0 8.9 - 10.3 mg/dL    Phosphorus 2.9 2.5 - 4.6 mg/dL    Albumin 2.7 (L) 3.5 - 5.0 g/dL    GFR calc non Af Amer 27 (L) >60 mL/min    GFR calc Af Amer 32 (L) >60 mL/min    Anion gap 11 5 - 15      Comment: Performed at [facility], [address] [place], [place] [address]  Glucose, capillary     Status: Abnormal    Collection Time: 09/20/18  8:09 AM  Result Value Ref Range    Glucose-Capillary 126 (H) 70 - 99 mg/dL  Glucose, capillary     Status: Abnormal    Collection Time:  09/20/18 11:52 AM  Result Value Ref Range    Glucose-Capillary 211 (H) 70 - 99 mg/dL  Magnesium     Status: None    Collection Time: 09/20/18  2:52 PM  Result Value Ref Range    Magnesium 2.0 1.7 - 2.4 mg/dL      Comment: Performed at [facility], [place] [address], [place], [place] [address]  Glucose, capillary     Status: Abnormal    Collection Time: 09/20/18  4:11 PM  Result Value Ref Range    Glucose-Capillary 131 (H) 70 - 99 mg/dL  Glucose, capillary     Status: Abnormal    Collection Time: 09/20/18 10:24 PM  Result Value Ref Range    Glucose-Capillary 216 (H) 70 - 99 mg/dL  Basic metabolic panel     Status: Abnormal    Collection Time: 09/21/18  3:07 AM  Result Value Ref Range    Sodium 136 135 - 145 mmol/L    Potassium 4.0 3.5 - 5.1 mmol/L      Comment: DELTA CHECK NOTED    Chloride 99 98 - 111 mmol/L    CO2 27 22 -  32 mmol/L    Glucose, Bld 130 (H) 70 - 99 mg/dL    BUN 23 8 - 23 mg/dL    Creatinine, Ser 1.50 (H) 0.44 - 1.00 mg/dL    Calcium 9.4 8.9 - 10.3 mg/dL    GFR calc non Af Amer 35 (L) >60 mL/min    GFR calc Af Amer 40 (L) >60 mL/min    Anion gap 10 5 - 15      Comment: Performed at [facility] [address] [place], [place] [address]    *Note: Due to a large number of results and/or encounters for the requested time period, some results have not been displayed. A complete set of results can be found in Results Review.     Imaging Results (Last 48 hours)  No results found.           Medical Problem List and Plan: 1.  Decreased functional mobility secondary to subacute to chronic right frontal lobe infarction/acute respiratory failure with sepsis lobar pneumonia as well as hypotension with bradycardia             -admit to inpatient rehab 2.  Antithrombotics: -DVT/anticoagulation: Eliquis             -antiplatelet therapy: Continue Plavix 75 mg daily 3. Pain Management: Neurontin 300 mg daily 4. Mood: Cymbalta 30 mg daily, Wellbutrin 150 mg  daily, Xanax 0.5 mg twice daily as needed             -antipsychotic agents: N/A 5. Neuropsych: This patient is capable of making decisions on her own behalf. 6. Skin/Wound Care: Routine skin checks 7. Fluids/Electrolytes/Nutrition: Routine in and outs with follow-up chemistries 8.  Hypotension/bradycardia related to sepsis.  WBC improved 12,800.  Dopamine discontinued.  Blood cultures no growth to date.  Low-dose Aldactone 12.5 mg daily, Toprol-XL 50 mg daily.  Daily monitoring of BP's at rest and with activity 9.  Acute on chronic diastolic congestive heart failure.  Demadex 20 mg daily.  Follow-up heart failure team             -daily weights 10.  AKI/CKD stage III.  Renal baseline 1.5-1.8.  Follow-up chemistries 11.  PAF.  Continue Eliquis.  Cardiac rate controlled 12.  CAD with CABG continue Plavix. 13.  OSA/asthma.  Intolerant of CPAP. 14.  Diabetes mellitus.  Hemoglobin A1c 7.0.  Lantus insulin 38 units nightly.  Check blood sugars before meals and at bedtime. Adjust regimen as needed 15.  ID/MRSA PCR screening positive/pneumonia.  Levaquin completed. 16 hyperlipidemia.  Crestor   Post Admission Physician Evaluation: 1. Functional deficits secondary  to debility and subacute right frontal infarct. 2. Patient is admitted to receive collaborative, interdisciplinary care between the physiatrist, rehab nursing staff, and therapy team. 3. Patient's level of medical complexity and substantial therapy needs in context of that medical necessity cannot be provided at a lesser intensity of care such as a SNF. 4. Patient has experienced substantial functional loss from his/her baseline which was documented above under the "Functional History" and "Functional Status" headings.  Judging by the patient's diagnosis, physical exam, and functional history, the patient has potential for functional progress which will result in measurable gains while on inpatient rehab.  These gains will be of substantial  and practical use upon discharge  in facilitating mobility and self-care at the household level. 5. Physiatrist will provide 24 hour management of medical needs as well as oversight of the therapy plan/treatment and provide guidance as appropriate  regarding the interaction of the two. 6. The Preadmission Screening has been reviewed and patient status is unchanged unless otherwise stated above. 7. 24 hour rehab nursing will assist with bladder management, bowel management, safety, skin/wound care, disease management, medication administration, pain management and patient education  and help integrate therapy concepts, techniques,education, etc. 8. PT will assess and treat for/with: Lower extremity strength, range of motion, stamina, balance, functional mobility, safety, adaptive techniques and equipment, NMR.   Goals are: mod I. 9. OT will assess and treat for/with: ADL's, functional mobility, safety, upper extremity strength, adaptive techniques and equipment, NMR.   Goals are: mod I. Therapy may proceed with showering this patient. 10. SLP will assess and treat for/with: n/a.  Goals are: n/a. 11. Case Management and Social Worker will assess and treat for psychological issues and discharge planning. 12. Team conference will be held weekly to assess progress toward goals and to determine barriers to discharge. 13. Patient will receive at least 3 hours of therapy per day at least 5 days per week. 14. ELOS: 5-7 days       15. Prognosis:  excellent   I have personally performed a face to face diagnostic evaluation of this patient and formulated the key components of the plan.  Additionally, I have personally reviewed laboratory data, imaging studies, as well as relevant notes and concur with the physician assistant's documentation above.  [name], MD, FAAPMR      Lavon Paganini [place], PA-C 09/21/2018

## 2018-09-21 NOTE — Progress Notes (Addendum)
Advanced Heart Failure Rounding Note   Subjective:    Feels good today.Denies SOB.   Objective:   Weight Range:  Vital Signs:   Temp:  [97.5 F (36.4 C)-98.2 F (36.8 C)] 97.9 F (36.6 C) (08/20 0655) Pulse Rate:  [70-95] 70 (08/20 0655) Resp:  [12-22] 12 (08/19 2205) BP: (140-183)/(59-120) 149/62 (08/20 0655) SpO2:  [97 %-100 %] 100 % (08/20 0655) Last BM Date: 09/20/18  Weight change: Filed Weights   09/18/18 0400  Weight: 108 kg   Filed Weights   09/18/18 0400  Weight: 108 kg   Intake/Output:   Intake/Output Summary (Last 24 hours) at 09/21/2018 0912 Last data filed at 09/20/2018 2356 Gross per 24 hour  Intake 121.42 ml  Output 1350 ml  Net -1228.58 ml     Physical Exam: General:  No resp difficulty HEENT: normal Neck: supple. no JVD. Carotids 2+ bilat; no bruits. No lymphadenopathy or thryomegaly appreciated. Cor: PMI nondisplaced. Regular rate & rhythm. No rubs, gallops or murmurs. Lungs: clear on room air.  Abdomen: obese, soft, nontender, nondistended. No hepatosplenomegaly. No bruits or masses. Good bowel sounds. Extremities: no cyanosis, clubbing, rash, edema Neuro: alert & orientedx3, cranial nerves grossly intact. moves all 4 extremities w/o difficulty. Affect pleasant rossly intact. moves all 4 extremities w/o difficulty. Affect pleasant  Not on telemetry.    Labs: Basic Metabolic Panel: Recent Labs  Lab 09/18/18 0023 09/18/18 1151 09/19/18 0342 09/20/18 0349 09/20/18 1452 09/21/18 0307  NA 130* 133* 133* 136  --  136  K 4.0 4.3 3.7 2.8*  --  4.0  CL 93* 97* 96* 96*  --  99  CO2 27 21* 27 29  --  27  GLUCOSE 181* 204* 191* 132*  --  130*  BUN 43* 46* 47* 32*  --  23  CREATININE 3.14* 3.39* 2.99* 1.83*  --  1.50*  CALCIUM 8.3* 8.8* 9.0 9.0  --  9.4  MG  --   --   --   --  2.0  --   PHOS  --   --   --  2.9  --   --     Liver Function Tests: Recent Labs  Lab 09/18/18 0023 09/20/18 0349  AST 23  --   ALT 19  --   ALKPHOS  89  --   BILITOT 0.8  --   PROT 5.1*  --   ALBUMIN 2.5* 2.7*   No results for input(s): LIPASE, AMYLASE in the last 168 hours. No results for input(s): AMMONIA in the last 168 hours.  CBC: Recent Labs  Lab 09/18/18 0023 09/18/18 1241 09/19/18 0342  WBC 13.5* 17.8* 12.8*  HGB 10.4* 11.7* 10.5*  HCT 32.5* 36.7 32.5*  MCV 88.1 88.0 87.6  PLT 204 226 210    Cardiac Enzymes: No results for input(s): CKTOTAL, CKMB, CKMBINDEX, TROPONINI in the last 168 hours.  BNP: BNP (last 3 results) Recent Labs    04/10/18 0512 09/10/18 0925 09/18/18 0627  BNP 149.9* >4,500.0* 226.7*    ProBNP (last 3 results) No results for input(s): PROBNP in the last 8760 hours.    Other results:  Imaging: No results found.   Medications:     Scheduled Medications: . amLODipine  10 mg Oral Daily  . apixaban  5 mg Oral BID  . buPROPion  150 mg Oral Daily  . Chlorhexidine Gluconate Cloth  6 each Topical Q0600  . clopidogrel  75 mg Oral Daily  . dextromethorphan-guaiFENesin  1  tablet Oral BID  . DULoxetine  30 mg Oral Daily  . gabapentin  300 mg Oral Daily  . insulin aspart  0-15 Units Subcutaneous TID WC  . insulin glargine  38 Units Subcutaneous QHS  . metoprolol succinate  50 mg Oral Daily  . multivitamin with minerals  1 tablet Oral Daily  . mupirocin ointment  1 application Nasal BID  . Ensure Max Protein  11 oz Oral Daily  . rosuvastatin  40 mg Oral q1800  . sodium chloride flush  10-40 mL Intracatheter Q12H    Infusions: . sodium chloride Stopped (09/20/18 0825)    PRN Medications: sodium chloride, albuterol, ALPRAZolam, ondansetron (ZOFRAN) IV, sodium chloride flush   Assessment/Plan:   1. Hypotension - suspect due to overdiuresis.  -Resolved.  2. AKI/ CKD Stage III - Due to overdiuresis - Renal function has been labile.Baseline likely 1.5-1.8 - Peaked 3.3 - Creatinine back to baseline 1.5  - BMET in am  3. Chronic Diastolic/Systolic HF - EF 24-82% on echo  09/17/18 - Renal function stable. Restart sprio 12.5 mg daily.  - Hold diuretics one more day then restart torsemide 20 mg daily.  - Renal function stable.   4. PAF -Regular pulse.  On eliquis 5 mg twice a day . Continue   5. CAD  S/P CABG.  -Occluded native RCA and SVG-RCA. Status post opening of CTO RCA with 4 overlapping Xience DES in 5/15.DES to sequential SVG-OM1/PLOM in 9/19.  9/2020S/P DES .  -No chest pain. Continue plavix 75 mg + crestor 40 mg daily + elqiuis.    6. OSA Intolerant CPAP   Length of Stay: 3   Amy Clegg NP-C  09/21/2018, 9:12 AM  Advanced Heart Failure Team Pager [phone] (M-F; 7a - 4p)  Please contact [facility] for night-coverage after hours (4p -7a ) and weekends on [email or web address]  Patient seen and examined with the above-signed Advanced Practice Provider and/or Housestaff. I personally reviewed laboratory data, imaging studies and relevant notes. I independently examined the patient and formulated the important aspects of the plan. I have edited the note to reflect any of my changes or salient points. I have personally discussed the plan with the patient and/or family.  Volume status stable. BP now elevated. Renal function back to baseline. Will restart HF meds today including low-dose diuretic. Will hold off on amlodipine. Ideally may be able to use spiro and SGLT2i to help manage BP and volume and avoid amlodipine and high doses of torsemide.   Glori Bickers, MD  9:43 AM

## 2018-09-21 NOTE — Discharge Summary (Signed)
Physician Discharge Summary  [name] [name] LHT:[id number] DOB: Mar 09, 1945 DOA: 09/18/2018  PCP: Derinda Late, MD  Admit date: 09/18/2018 Discharge date: 09/21/2018  Admitted From: Home Disposition:  Home  Discharge Condition:Stable CODE STATUS:FULL Diet recommendation: Heart Healthy  Brief/Interim Summary:  [name] [name] a 73 y.o.femalewith medical history significant ofhypertension, hyperlipidemia, diabetes mellitus, asthma, stroke, GERD, depression with anxiety, PVD, OSA not on CPAP, CAD, stent, CABG,sCHF with EF 40-45%, memory change, atrial fibrillation on Eliquis, left bundle blockage, CKD stage III, who was sent from CIR for the evaluation of hypotension .She was sent to ICU for initiation of dopamine.  She was discharged from here earlier this month to CIR after being treated for bronchopneumonia, CHF exacerbation. Her hypotension is suspected secondary to diuretics. Heart failure team was following.  Currently she is hemodynamically stable.    Blood pressure is currently stable.  Her home medications have been readjusted.  She is hemodynamically stable for discharge to CIR today.  Following problems were addressed during her hospitalization:   Hypotension: Suspected to be secondary to volume depletion from overdiuresis. Currently off dopamine drip.  Current blood pressure acceptable. Home medicine adjusted.   Slurred speech  Resolved CT head without any acute intracranial abnormalities  Chronicsystolic CHF and lobar pneumonia Follows with cardiology. On torsemide, metolazone, spironolactone at home. Last echocardiogram showed ejection fraction of 45-50%. Heart failure team following. Metolazone discontinued.  AKI on CKD 3 Kidney function improving and near baseline now.  AKI precipitated secondary to hypotension.Check BMP in a week.  Hypokalemia Continue supplementation.  Paroxysmal Atrial fibrillation  Cont Eliquis,Toprol   Falls PT/OT  recommendation for CIR.  She was discharged with CIR on last admission   Type II diabetes mellitus with renal manifestations [id number].Continue home regimen  Asthma No wheezing at the moment- no exacerbation  Depression with anxiety OnCymbalta + Wellbutrin  Hyperlipemia On Statin  Essential hypertension Continue current medications  GERD  On Protonix  CAD (coronary artery disease) of bypass graft On Plavix, statin, Eliquis     Discharge Diagnoses:  Principal Problem:   Hypotension Active Problems:   Chronic systolic CHF (congestive heart failure), NYHA class 3 (HCC)   CKD (chronic kidney disease), stage III (HCC)   Bradycardia   AKI (acute kidney injury) ([place])   Pressure injury of skin    Discharge Instructions  Discharge Instructions    Diet - low sodium heart healthy   Complete by: As directed    Discharge instructions   Complete by: As directed    1)Take your medications as instructed. 3)Check BMP in a week.   Increase activity slowly   Complete by: As directed      Allergies as of 09/21/2018      Reactions   Amoxicillin Shortness Of Breath, Rash   Brilinta [ticagrelor] Shortness Of Breath   Erythromycin Shortness Of Breath, Other (See Comments), Hives   Trouble swallowing   Flagyl [metronidazole] Shortness Of Breath, Palpitations   Penicillins Hives, Shortness Of Breath, Rash, Other (See Comments)   Has patient had a PCN reaction causing immediate rash, facial/tongue/throat swelling, SOB or lightheadedness with hypotension: Yes Has patient had a PCN reaction causing severe rash involving mucus membranes or skin necrosis: No Has patient had a PCN reaction that required hospitalization: Yes Has patient had a PCN reaction occurring within the last 10 years: No If all of the above answers are "NO", then may proceed with Cephalosporin use.   Isosorbide Mononitrate [isosorbide Nitrate] Other (See Comments)   Joint aches,  muscles hurt,  difficult to walk   Jardiance [empagliflozin] Other (See Comments)   Nausea, joint aches, muscles aches   Metformin And Related Other (See Comments)   Stomach pain, cold sweats, joint pain, burred vision, dizziness   Tape Other (See Comments)   Skin pulls off with certain types Plastic tape causes skin to rip if left on for long periods of time   Erythromycin Base Rash      Medication List    STOP taking these medications   amLODipine 10 MG tablet Commonly known as: NORVASC   metolazone 2.5 MG tablet Commonly known as: ZAROXOLYN     TAKE these medications   Advair HFA 115-21 MCG/ACT inhaler Generic drug: fluticasone-salmeterol Inhale 2 puffs into the lungs.   albuterol 108 (90 Base) MCG/ACT inhaler Commonly known as: VENTOLIN HFA Inhale 2 puffs into the lungs every 6 (six) hours as needed for wheezing.   ALPRAZolam 0.5 MG tablet Commonly known as: XANAX Take 1 tablet (0.5 mg total) by mouth 2 (two) times daily.   apixaban 5 MG Tabs tablet Commonly known as: ELIQUIS Take 1 tablet (5 mg total) by mouth 2 (two) times daily.   bisacodyl 5 MG EC tablet Commonly known as: Dulcolax Take 1 tablet (5 mg total) by mouth daily as needed for moderate constipation.   budesonide 0.25 MG/2ML nebulizer solution Commonly known as: PULMICORT Take 2 mLs (0.25 mg total) by nebulization 2 (two) times daily.   buPROPion 150 MG 24 hr tablet Commonly known as: WELLBUTRIN XL Take 150 mg by mouth daily.   clopidogrel 75 MG tablet Commonly known as: PLAVIX Take 1 tablet (75 mg total) by mouth at bedtime.   DULoxetine 30 MG capsule Commonly known as: CYMBALTA Take 1 capsule (30 mg total) by mouth daily.   ezetimibe 10 MG tablet Commonly known as: ZETIA Take 1 tablet (10 mg total) by mouth at bedtime.   gabapentin 300 MG capsule Commonly known as: NEURONTIN Take 2 capsules (600 mg total) by mouth 2 (two) times daily.   Healthy Eyes/Lutein Tabs Take 1 tablet by mouth daily.    insulin aspart 100 UNIT/ML injection Commonly known as: novoLOG Inject 8 Units into the skin 3 (three) times daily with meals. What changed:   how much to take  when to take this   insulin glargine 100 UNIT/ML injection Commonly known as: LANTUS Inject 0.35 mLs (35 Units total) into the skin at bedtime. What changed: how much to take   ipratropium-albuterol 0.5-2.5 (3) MG/3ML Soln Commonly known as: DUONEB Take 3 mLs by nebulization 2 (two) times daily. What changed:   when to take this  reasons to take this   metoprolol succinate 50 MG 24 hr tablet Commonly known as: TOPROL-XL Take 25mg  daily with or immediately following a meal.   nitroGLYCERIN 0.3 MG SL tablet Commonly known as: NITROSTAT Place 1 tablet (0.3 mg total) under the tongue every 5 (five) minutes x 3 doses as needed for chest pain.   ondansetron 4 MG tablet Commonly known as: ZOFRAN Take 4 mg by mouth every 6 (six) hours as needed for nausea/vomiting.   oxyCODONE 5 MG immediate release tablet Commonly known as: Oxy IR/ROXICODONE Take 1 tablet (5 mg total) by mouth every 4 (four) hours as needed for moderate pain or severe pain.   OXYGEN Inhale 2 L into the lungs at bedtime.   pantoprazole 40 MG tablet Commonly known as: PROTONIX Take 1 tablet (40 mg total) by mouth daily.  potassium chloride SA 20 MEQ tablet Commonly known as: K-DUR TAKE ONE (1) TABLET BY MOUTH TWO (2) TIMES DAILY   Praluent 150 MG/ML Soaj Generic drug: Alirocumab Inject 1 pen into the skin every 14 (fourteen) days.   Prolia 60 MG/ML Sosy injection Generic drug: denosumab Inject 69 mg into the skin every 6 (six) months.   ranolazine 1000 MG SR tablet Commonly known as: RANEXA Take 1,000 mg by mouth every 12 (twelve) hours.   rosuvastatin 40 MG tablet Commonly known as: CRESTOR Take 1 tablet (40 mg total) by mouth at bedtime.   spironolactone 25 MG tablet Commonly known as: ALDACTONE Take 1 tablet (25 mg total) by  mouth daily.   torsemide 20 MG tablet Commonly known as: DEMADEX Take 1 tablet (20 mg total) by mouth daily. Start taking on: September 22, 2018 What changed: when to take this   traMADol 50 MG tablet Commonly known as: ULTRAM Take 1 tablet (50 mg total) by mouth every 6 (six) hours as needed for moderate pain.   triamcinolone cream 0.1 % Commonly known as: KENALOG Apply 1 application topically daily.   vitamin B-12 1000 MCG tablet Commonly known as: CYANOCOBALAMIN Take 1,000 mcg by mouth daily.   zolpidem 10 MG tablet Commonly known as: AMBIEN Take 10 mg by mouth every evening.       Allergies  Allergen Reactions  . Amoxicillin Shortness Of Breath and Rash  . Brilinta [Ticagrelor] Shortness Of Breath  . Erythromycin Shortness Of Breath, Other (See Comments) and Hives    Trouble swallowing  . Flagyl [Metronidazole] Shortness Of Breath and Palpitations  . Penicillins Hives, Shortness Of Breath, Rash and Other (See Comments)    Has patient had a PCN reaction causing immediate rash, facial/tongue/throat swelling, SOB or lightheadedness with hypotension: Yes Has patient had a PCN reaction causing severe rash involving mucus membranes or skin necrosis: No Has patient had a PCN reaction that required hospitalization: Yes Has patient had a PCN reaction occurring within the last 10 years: No If all of the above answers are "NO", then may proceed with Cephalosporin use.   . Isosorbide Mononitrate [Isosorbide Nitrate] Other (See Comments)    Joint aches, muscles hurt, difficult to walk   . Jardiance [Empagliflozin] Other (See Comments)    Nausea, joint aches, muscles aches  . Metformin And Related Other (See Comments)    Stomach pain, cold sweats, joint pain, burred vision, dizziness  . Tape Other (See Comments)    Skin pulls off with certain types Plastic tape causes skin to rip if left on for long periods of time  . Erythromycin Base Rash     Consultations:  Cardiology   Procedures/Studies: Dg Ribs Unilateral Left  Result Date: 09/09/2018 CLINICAL DATA:  Recent falls.  Left leg and rib pain. EXAM: LEFT RIBS - 2 VIEW COMPARISON:  Chest radiographs today.  Chest CT 04/03/2018. FINDINGS: Two views demonstrate no evidence of acute left-sided rib fracture or focal rib lesion. As noted on earlier radiographs, there is probable pulmonary edema with stable cardiomegaly. No significant pleural effusion or pneumothorax. There are postsurgical changes in the left upper quadrant of the abdomen. IMPRESSION: No evidence of left-sided rib fracture, pleural effusion or pneumothorax. Electronically Signed   By: Richardean Sale M.D.   On: 09/09/2018 18:20   Ct Head Wo Contrast  Result Date: 09/18/2018 CLINICAL DATA:  Focal neural deficit of less than 6 hours, suspected stroke, slurred speech, headache EXAM: CT HEAD WITHOUT CONTRAST TECHNIQUE: Contiguous axial  images were obtained from the base of the skull through the vertex without intravenous contrast. Sagittal and coronal MPR images reconstructed from axial data set. COMPARISON:  09/09/2018 FINDINGS: Brain: Generalized atrophy. Normal ventricular morphology. No midline shift or mass effect. Small vessel chronic ischemic changes of deep cerebral white matter. Old RIGHT occipital infarct. Old white matter infarcts in RIGHT centrum semiovale. No intracranial hemorrhage, mass lesion, or evidence of acute infarction. No extra-axial fluid collections. No intracranial hemorrhage, mass lesion, evidence of acute infarction, or extra-axial fluid collection. Vascular: Atherosclerotic calcifications of internal carotid arteries at skull base Skull: Intact.  Mild hyperostosis frontalis interna Sinuses/Orbits: Clear Other: N/A IMPRESSION: Atrophy with small vessel chronic ischemic changes of deep cerebral white matter. Old RIGHT occipital and RIGHT centrum semiovale infarcts. No acute intracranial abnormalities.  Electronically Signed   By: Lavonia Dana M.D.   On: 09/18/2018 08:22   Ct Head Wo Contrast  Result Date: 09/09/2018 CLINICAL DATA:  Head trauma. High clinical risk. Slurred speech. EXAM: CT HEAD WITHOUT CONTRAST TECHNIQUE: Contiguous axial images were obtained from the base of the skull through the vertex without intravenous contrast. COMPARISON:  March 29, 2018 FINDINGS: Brain: No evidence of acute hemorrhage, hydrocephalus, extra-axial collection or mass lesion/mass effect. Chronic right occipital lobe infarct. New areas of hypoattenuation in the subcortical white matter of the right frontal lobe may represent small age-indeterminate ischemic infarcts. Vascular: Calcific atherosclerotic disease of the intra cavernous carotid arteries. Skull: Normal. Negative for fracture or focal lesion. Sinuses/Orbits: Minimal opacification of the left mastoid air cells. Paranasal sinuses are clear. Other: None. IMPRESSION: 1. New areas of hypoattenuation in the subcortical white matter of the right frontal lobe may represent small age-indeterminate ischemic infarcts. 2. Chronic right occipital lobe infarct. 3. No evidence of traumatic injury to the brain. Electronically Signed   By: Fidela Salisbury M.D.   On: 09/09/2018 17:29   Mr Angio Head Wo Contrast  Result Date: 09/10/2018 CLINICAL DATA:  Weakness and dizziness for the past several days. Mild slurred speech developed today. EXAM: MRI HEAD WITHOUT CONTRAST MRA HEAD WITHOUT CONTRAST TECHNIQUE: Multiplanar, multiecho pulse sequences of the brain and surrounding structures were obtained without intravenous contrast. Angiographic images of the head were obtained using MRA technique without contrast. COMPARISON:  Brain MRI 10/07/2013 and MRA head 06/01/2014 FINDINGS: MRI HEAD FINDINGS BRAIN: There is no acute infarct, acute hemorrhage or extra-axial collection. There is an old right PCA territory infarct. Multiple areas of rounded hyperintense T2-weighted signal are  present within the white matter of both hemispheres. The cerebral and cerebellar volume are age-appropriate. There is no hydrocephalus. The midline structures are normal. VASCULAR: There are 3-5 scattered foci of chronic microhemorrhage. The major intracranial arterial and venous sinus flow voids are normal. SKULL AND UPPER CERVICAL SPINE: Calvarial bone marrow signal is normal. There is no skull base mass. The visualized upper cervical spine and soft tissues are normal. SINUSES/ORBITS: There are no fluid levels or advanced mucosal thickening. The mastoid air cells and middle ear cavities are free of fluid. The orbits are normal. MRA HEAD FINDINGS POSTERIOR CIRCULATION: --Vertebral arteries: Normal V4 segments. --Posterior inferior cerebellar arteries (PICA): Patent origins from the vertebral arteries. --Anterior inferior cerebellar arteries (AICA): Not clearly visualized, though this is not uncommon. --Basilar artery: Proximal basilar artery is severely narrowed, but remains patent. --Superior cerebellar arteries: Normal. --Posterior cerebral arteries (PCA): The right PCA arises from the basilar artery with a diminutive right posterior communicating artery (P-comm). There is no right PCA occlusion or  proximal stenosis. The left PCA originates combination from the basilar artery and AE small left P-comm. There is severe stenosis of the P1 and P2 segments. ANTERIOR CIRCULATION: --Intracranial internal carotid arteries: There is multifocal severe stenosis of both internal carotid arteries at the skull base. --Anterior cerebral arteries (ACA): Limited enhancement of the left A1 segment. Otherwise normal. --Middle cerebral arteries (MCA): There is mild narrowing within some of the distal right MCA M2 branches, but the right MCA is otherwise normal. There is loss of flow related enhancement at the left M1 segment proximally. The distal M1 segment enhances normally. The left M2 branches are patent. IMPRESSION: 1. No acute  ischemic infarct or intracranial hemorrhage. 2. Multiple rounded areas of peripheral hyperintense T2-weighted signal within the bilateral supratentorial white matter, corresponding to the areas of hypoattenuation on the earlier head CT. These likely indicate late subacute to early chronic infarcts, not present on prior MRIs. No diffusion restriction to indicate acute ischemia. 3. Severe intracranial atherosclerosis with short segment occlusion of the proximal left middle cerebral artery with normal enhancement of the distal left M1 segment and the M2 branches, new compared to 06/01/2014. 4. Severe stenosis of the left PCA P1 and P2 segments, unchanged compared to 06/01/2014. 5. Severe stenoses of both internal carotid arteries at the skull base, progressed compared to 06/01/2014. Electronically Signed   By: Ulyses Jarred M.D.   On: 09/10/2018 02:19   Mr Brain Wo Contrast  Result Date: 09/10/2018 CLINICAL DATA:  Weakness and dizziness for the past several days. Mild slurred speech developed today. EXAM: MRI HEAD WITHOUT CONTRAST MRA HEAD WITHOUT CONTRAST TECHNIQUE: Multiplanar, multiecho pulse sequences of the brain and surrounding structures were obtained without intravenous contrast. Angiographic images of the head were obtained using MRA technique without contrast. COMPARISON:  Brain MRI 10/07/2013 and MRA head 06/01/2014 FINDINGS: MRI HEAD FINDINGS BRAIN: There is no acute infarct, acute hemorrhage or extra-axial collection. There is an old right PCA territory infarct. Multiple areas of rounded hyperintense T2-weighted signal are present within the white matter of both hemispheres. The cerebral and cerebellar volume are age-appropriate. There is no hydrocephalus. The midline structures are normal. VASCULAR: There are 3-5 scattered foci of chronic microhemorrhage. The major intracranial arterial and venous sinus flow voids are normal. SKULL AND UPPER CERVICAL SPINE: Calvarial bone marrow signal is normal. There  is no skull base mass. The visualized upper cervical spine and soft tissues are normal. SINUSES/ORBITS: There are no fluid levels or advanced mucosal thickening. The mastoid air cells and middle ear cavities are free of fluid. The orbits are normal. MRA HEAD FINDINGS POSTERIOR CIRCULATION: --Vertebral arteries: Normal V4 segments. --Posterior inferior cerebellar arteries (PICA): Patent origins from the vertebral arteries. --Anterior inferior cerebellar arteries (AICA): Not clearly visualized, though this is not uncommon. --Basilar artery: Proximal basilar artery is severely narrowed, but remains patent. --Superior cerebellar arteries: Normal. --Posterior cerebral arteries (PCA): The right PCA arises from the basilar artery with a diminutive right posterior communicating artery (P-comm). There is no right PCA occlusion or proximal stenosis. The left PCA originates combination from the basilar artery and AE small left P-comm. There is severe stenosis of the P1 and P2 segments. ANTERIOR CIRCULATION: --Intracranial internal carotid arteries: There is multifocal severe stenosis of both internal carotid arteries at the skull base. --Anterior cerebral arteries (ACA): Limited enhancement of the left A1 segment. Otherwise normal. --Middle cerebral arteries (MCA): There is mild narrowing within some of the distal right MCA M2 branches, but the right MCA is  otherwise normal. There is loss of flow related enhancement at the left M1 segment proximally. The distal M1 segment enhances normally. The left M2 branches are patent. IMPRESSION: 1. No acute ischemic infarct or intracranial hemorrhage. 2. Multiple rounded areas of peripheral hyperintense T2-weighted signal within the bilateral supratentorial white matter, corresponding to the areas of hypoattenuation on the earlier head CT. These likely indicate late subacute to early chronic infarcts, not present on prior MRIs. No diffusion restriction to indicate acute ischemia. 3.  Severe intracranial atherosclerosis with short segment occlusion of the proximal left middle cerebral artery with normal enhancement of the distal left M1 segment and the M2 branches, new compared to 06/01/2014. 4. Severe stenosis of the left PCA P1 and P2 segments, unchanged compared to 06/01/2014. 5. Severe stenoses of both internal carotid arteries at the skull base, progressed compared to 06/01/2014. Electronically Signed   By: Ulyses Jarred M.D.   On: 09/10/2018 02:19   US Renal  Result Date: 09/18/2018 CLINICAL DATA:  Initial evaluation for acute renal injury. EXAM: RENAL / URINARY TRACT ULTRASOUND COMPLETE COMPARISON:  None available. FINDINGS: Right Kidney: Renal measurements: 10.6 x 5.1 x 6.3 cm = volume: 178.1 mL . Echogenicity within normal limits. No mass or hydronephrosis visualized. Left Kidney: Renal measurements: 9.5 x 4.8 x 4.3 cm = volume: 100.0 mL. Echogenicity within normal limits. No mass or hydronephrosis visualized. Bladder: Not visualized on this exam. IMPRESSION: Normal renal ultrasound. No hydronephrosis or other significant finding. Electronically Signed   By: Jeannine Boga M.D.   On: 09/18/2018 03:38   Dg Chest Port 1 View  Result Date: 09/18/2018 CLINICAL DATA:  Central line placement EXAM: PORTABLE CHEST 1 VIEW COMPARISON:  09/18/2018 at 0153 hours FINDINGS: Left IJ venous catheter terminates at the cavoatrial junction. Pulmonary vascular congestion without frank interstitial edema. No pleural effusion or pneumothorax. Cardiomegaly.  Postsurgical changes related to prior CABG. Median sternotomy. IMPRESSION: Left IJ venous catheter terminates at the cavoatrial junction. Electronically Signed   By: Julian Hy M.D.   On: 09/18/2018 18:03   Dg Chest Port 1 View  Result Date: 09/18/2018 CLINICAL DATA:  Initial evaluation for hypotension, shortness of breath. EXAM: PORTABLE CHEST 1 VIEW COMPARISON:  Prior radiograph from 09/10/2018. FINDINGS: Median sternotomy wires  with underlying surgical clips and CABG markers noted. Moderate cardiomegaly, stable. Mediastinal silhouette within normal limits. Lungs normally inflated. No focal infiltrates. No pulmonary edema or discernible pleural effusion. No pneumothorax. No acute osseous finding. IMPRESSION: 1. No radiographic evidence for active cardiopulmonary disease. 2. Stable cardiomegaly without pulmonary edema. Electronically Signed   By: Jeannine Boga M.D.   On: 09/18/2018 02:11   Dg Chest Port 1 View  Result Date: 09/10/2018 CLINICAL DATA:  73 year old female with history of dyspnea. EXAM: PORTABLE CHEST 1 VIEW COMPARISON:  Chest x-ray 09/09/2018. FINDINGS: There is cephalization of the pulmonary vasculature and slight indistinctness of the interstitial markings suggestive of mild pulmonary edema. More focal consolidative airspace disease in the right lung base. No definite pleural effusions. Mild cardiomegaly. Upper mediastinal contours are within normal limits. Aortic atherosclerosis. Status post median sternotomy for CABG. IMPRESSION: 1. The appearance the chest suggests congestive heart failure, as above. In addition, there is more focal airspace consolidation in the right lung base which could suggest superimposed developing bronchopneumonia. Further clinical evaluation is suggested. 2. Aortic atherosclerosis. Electronically Signed   By: Vinnie Langton M.D.   On: 09/10/2018 10:31   Dg Chest Port 1 View  Result Date: 09/09/2018 CLINICAL DATA:  Shortness of breath multiple recent falls and slurred speech. History of diabetes and stroke. EXAM: PORTABLE CHEST 1 VIEW COMPARISON:  Radiographs 05/18/2018 and 04/05/2018.  CT 04/03/2018. FINDINGS: 1549 hours. Stable cardiomegaly post median sternotomy and CABG. Pulmonary vascular congestion has slightly worsened, and there is probable mild pulmonary edema. No confluent airspace opacity, pleural effusion or pneumothorax. The bones appear intact. Telemetry leads overlie  the chest. IMPRESSION: Cardiomegaly with increased vascular congestion and probable mild pulmonary edema. No confluent airspace opacity or pneumothorax. Electronically Signed   By: Richardean Sale M.D.   On: 09/09/2018 16:31   Dg Femur Min 2 Views Left  Result Date: 09/09/2018 CLINICAL DATA:  Recent falls.  Left thigh pain. EXAM: LEFT FEMUR 2 VIEWS COMPARISON:  Left hip radiographs 07/31/2018. FINDINGS: The mineralization and alignment are normal. There is no evidence of acute fracture or dislocation. Stable mild degenerative changes at the left hip and knee. There are scattered vascular calcifications and vascular clips. No unexpected foreign bodies. IMPRESSION: No evidence of acute fracture or dislocation. Electronically Signed   By: Richardean Sale M.D.   On: 09/09/2018 18:22   Vas US Carotid  Result Date: 09/11/2018 Carotid Arterial Duplex Study Indications:       CVA. Risk Factors:      Hypertension, hyperlipidemia, Diabetes, coronary artery                    disease, prior CVA. Comparison Study:  05/23/17 Performing Technologist: Abram Sander RVS  Examination Guidelines: A complete evaluation includes B-mode imaging, spectral Doppler, color Doppler, and power Doppler as needed of all accessible portions of each vessel. Bilateral testing is considered an integral part of a complete examination. Limited examinations for reoccurring indications may be performed as noted.  Right Carotid Findings: +----------+--------+--------+--------+------------+---------+           PSV cm/sEDV cm/sStenosisDescribe    Comments  +----------+--------+--------+--------+------------+---------+ CCA Prox  34      8               heterogenous          +----------+--------+--------+--------+------------+---------+ CCA Distal51      11              heterogenous          +----------+--------+--------+--------+------------+---------+ ICA Prox  66      25      1-39%   heterogenousShadowing  +----------+--------+--------+--------+------------+---------+ ICA Distal59      30                                    +----------+--------+--------+--------+------------+---------+ ECA       466             >50%                          +----------+--------+--------+--------+------------+---------+ +----------+--------+-------+--------+-------------------+           PSV cm/sEDV cmsDescribeArm Pressure (mmHG) +----------+--------+-------+--------+-------------------+ [id number]                                         +----------+--------+-------+--------+-------------------+ +---------+--------+--+--------+-+---------+ VertebralPSV cm/s30EDV cm/s9Antegrade +---------+--------+--+--------+-+---------+  Left Carotid Findings: +----------+--------+--------+--------+------------+--------+           PSV cm/sEDV cm/sStenosisDescribe    Comments +----------+--------+--------+--------+------------+--------+ CCA Prox  70  19              heterogenous         +----------+--------+--------+--------+------------+--------+ CCA Distal52      16              heterogenous         +----------+--------+--------+--------+------------+--------+ ECA       392             >50%                         +----------+--------+--------+--------+------------+--------+ +----------+--------+--------+--------+-------------------+ SubclavianPSV cm/sEDV cm/sDescribeArm Pressure (mmHG) +----------+--------+--------+--------+-------------------+           85                                          +----------+--------+--------+--------+-------------------+ +---------+--------+--+--------+--+---------+ VertebralPSV cm/s93EDV cm/s25Antegrade +---------+--------+--+--------+--+---------+  Left Stent(s): +---------------+--+--+-------------+++ Prox to Stent  6416              +---------------+--+--+-------------+++ Proximal Stent 7215<50% stenosis  +---------------+--+--+-------------+++ Mid Stent      7535              +---------------+--+--+-------------+++ Distal Stent   7837              +---------------+--+--+-------------+++ Distal to [id number]              +---------------+--+--+-------------+++  Summary: Right Carotid: Velocities in the right ICA are consistent with a 1-39% stenosis.                The ECA appears >50% stenosed. Left Carotid: ICA stent <50% stenosis. The ECA appears >50% stenosed. Vertebrals: Bilateral vertebral arteries demonstrate antegrade flow. *See table(s) above for measurements and observations.  Electronically signed by Harold Barban MD on 09/11/2018 at 10:07:51 AM.    Final        Subjective:  Patient seen and examined the bedside this morning.  Alert and awake, very comfortable.  Stable for discharge.  Discharge Exam: Vitals:   09/20/18 2205 09/21/18 0655  BP: (!) 180/66 (!) 149/62  Pulse: 95 70  Resp: 12   Temp: (!) 97.5 F (36.4 C) 97.9 F (36.6 C)  SpO2: 100% 100%   Vitals:   09/20/18 1600 09/20/18 1800 09/20/18 2205 09/21/18 0655  BP: (!) 164/64 (!) 183/90 (!) 180/66 (!) 149/62  Pulse: 87 91 95 70  Resp: (!) 22 20 12    Temp:   (!) 97.5 F (36.4 C) 97.9 F (36.6 C)  TempSrc:   Oral Oral  SpO2: 100% 98% 100% 100%  Weight:        General: Pt is alert, awake, not in acute distress Cardiovascular: RRR, S1/S2 +, no rubs, no gallops Respiratory: CTA bilaterally, no wheezing, no rhonchi Abdominal: Soft, NT, ND, bowel sounds + Extremities: no edema, no cyanosis    The results of significant diagnostics from this hospitalization (including imaging, microbiology, ancillary and laboratory) are listed below for reference.     Microbiology: Recent Results (from the past 240 hour(s))  Culture, blood (routine x 2)     Status: None (Preliminary result)   Collection Time: 09/18/18  4:40 AM   Specimen: BLOOD LEFT HAND  Result Value Ref Range Status   Specimen Description  BLOOD LEFT HAND  Final   Special Requests   Final    BOTTLES DRAWN AEROBIC ONLY Blood Culture results may not be optimal  due to an inadequate volume of blood received in culture bottles   Culture   Final    NO GROWTH 3 DAYS Performed at [facility], [place] [address] [place], [place] [address]    Report Status PENDING  Incomplete  Culture, blood (routine x 2)     Status: None (Preliminary result)   Collection Time: 09/18/18  4:57 AM   Specimen: BLOOD RIGHT HAND  Result Value Ref Range Status   Specimen Description BLOOD RIGHT HAND  Final   Special Requests   Final    BOTTLES DRAWN AEROBIC ONLY Blood Culture results may not be optimal due to an inadequate volume of blood received in culture bottles   Culture   Final    NO GROWTH 3 DAYS Performed at [facility], [place] [address], [place], [place] [address]    Report Status PENDING  Incomplete     Labs: BNP (last 3 results) Recent Labs    04/10/18 0512 09/10/18 0925 09/18/18 0627  BNP 149.9* >4,500.0* 735.3*   Basic Metabolic Panel: Recent Labs  Lab 09/18/18 0023 09/18/18 1151 09/19/18 0342 09/20/18 0349 09/20/18 1452 09/21/18 0307  NA 130* 133* 133* 136  --  136  K 4.0 4.3 3.7 2.8*  --  4.0  CL 93* 97* 96* 96*  --  99  CO2 27 21* 27 29  --  27  GLUCOSE 181* 204* 191* 132*  --  130*  BUN 43* 46* 47* 32*  --  23  CREATININE 3.14* 3.39* 2.99* 1.83*  --  1.50*  CALCIUM 8.3* 8.8* 9.0 9.0  --  9.4  MG  --   --   --   --  2.0  --   PHOS  --   --   --  2.9  --   --    Liver Function Tests: Recent Labs  Lab 09/18/18 0023 09/20/18 0349  AST 23  --   ALT 19  --   ALKPHOS 89  --   BILITOT 0.8  --   PROT 5.1*  --   ALBUMIN 2.5* 2.7*   No results for input(s): LIPASE, AMYLASE in the last 168 hours. No results for input(s): AMMONIA in the last 168 hours. CBC: Recent Labs  Lab 09/18/18 0023 09/18/18 1241 09/19/18 0342  WBC 13.5* 17.8* 12.8*  HGB 10.4* 11.7* 10.5*  HCT 32.5* 36.7 32.5*  MCV 88.1 88.0  87.6  PLT 204 226 210   Cardiac Enzymes: No results for input(s): CKTOTAL, CKMB, CKMBINDEX, TROPONINI in the last 168 hours. BNP: Invalid input(s): POCBNP CBG: Recent Labs  Lab 09/20/18 0809 09/20/18 1152 09/20/18 1611 09/20/18 2224 09/21/18 0825  GLUCAP 126* 211* 131* 216* 180*   D-Dimer No results for input(s): DDIMER in the last 72 hours. Hgb A1c No results for input(s): HGBA1C in the last 72 hours. Lipid Profile No results for input(s): CHOL, HDL, LDLCALC, TRIG, CHOLHDL, LDLDIRECT in the last 72 hours. Thyroid function studies No results for input(s): TSH, T4TOTAL, T3FREE, THYROIDAB in the last 72 hours.  Invalid input(s): FREET3 Anemia work up No results for input(s): VITAMINB12, FOLATE, FERRITIN, TIBC, IRON, RETICCTPCT in the last 72 hours. Urinalysis    Component Value Date/Time   COLORURINE AMBER (A) 09/18/2018 1309   APPEARANCEUR HAZY (A) 09/18/2018 1309   LABSPEC 1.018 09/18/2018 1309   PHURINE 5.0 09/18/2018 1309   GLUCOSEU NEGATIVE 09/18/2018 1309   HGBUR NEGATIVE 09/18/2018 1309   BILIRUBINUR NEGATIVE 09/18/2018 1309   KETONESUR  NEGATIVE 09/18/2018 1309   PROTEINUR 100 (A) 09/18/2018 1309   UROBILINOGEN 1.0 04/16/2011 0954   NITRITE NEGATIVE 09/18/2018 1309   LEUKOCYTESUR NEGATIVE 09/18/2018 1309   Sepsis Labs Invalid input(s): PROCALCITONIN,  WBC,  LACTICIDVEN Microbiology Recent Results (from the past 240 hour(s))  Culture, blood (routine x 2)     Status: None (Preliminary result)   Collection Time: 09/18/18  4:40 AM   Specimen: BLOOD LEFT HAND  Result Value Ref Range Status   Specimen Description BLOOD LEFT HAND  Final   Special Requests   Final    BOTTLES DRAWN AEROBIC ONLY Blood Culture results may not be optimal due to an inadequate volume of blood received in culture bottles   Culture   Final    NO GROWTH 3 DAYS Performed at [facility], [place] [address] [place], [place] [address]    Report Status PENDING  Incomplete  Culture,  blood (routine x 2)     Status: None (Preliminary result)   Collection Time: 09/18/18  4:57 AM   Specimen: BLOOD RIGHT HAND  Result Value Ref Range Status   Specimen Description BLOOD RIGHT HAND  Final   Special Requests   Final    BOTTLES DRAWN AEROBIC ONLY Blood Culture results may not be optimal due to an inadequate volume of blood received in culture bottles   Culture   Final    NO GROWTH 3 DAYS Performed at [facility], [place] [address], [place], [place] [address]    Report Status PENDING  Incomplete    Please note: You were cared for by a hospitalist during your hospital stay. Once you are discharged, your primary care physician will handle any further medical issues. Please note that NO REFILLS for any discharge medications will be authorized once you are discharged, as it is imperative that you return to your primary care physician (or establish a relationship with a primary care physician if you do not have one) for your post hospital discharge needs so that they can reassess your need for medications and monitor your lab values.    Time coordinating discharge: 40 minutes  SIGNED:   Shelly Coss, MD  [facility] 09/21/2018, 11:43 AM Pager [phone]  If 7PM-7AM, please contact night-coverage [email or web address] Password [email or web address]

## 2018-09-21 NOTE — PMR Pre-admission (Signed)
PMR Admission Coordinator Pre-Admission Assessment  Patient: [name] [name] is an 73 y.o., female [name]: [id number] DOB: Feb 12, 1945 Height:   Weight: 108 kg  Insurance Information HMO:     PPO:      PCP:      IPA:      80/20:      OTHER: no HMO PRIMARY: Medicare a and b      Policy#: [id number]      Subscriber: pt Benefits:  Phone #: Passport one online     Name: 09/21/2018 Eff. Date: 12/02/2012     Deduct: $1408      Out of Pocket Max: none      Life Max: none CIR: 100%      SNF: has been out of SNF since 07/21/2018 where she has used 99 days since 04/10/2018. Was out of SNF 07/21/2018 at home so 49 days outside of acute hospital and SNF Outpatient: 80%   Co-Pay: 20% Home Health: 100%      Co-Pay: none DME: 80%     Co-Pay: 20% Providers: pt choice  SECONDARY: AARP      Policy#: [id number]      Subscriber: pt  Medicaid Application Date:       Case Manager:  Disability Application Date:       Case Worker:   The "Data Collection Information Summary" for patients in Inpatient Rehabilitation Facilities with attached "Privacy Act [place] Records" was provided and verbally reviewed with: Patient  Emergency Contact Information Contact Information    Name Relation Home Work Mobile   Dick[place] Spouse [phone]  [phone]   [facility] Daughter   [phone]      Current Medical History  Patient Admitting Diagnosis: Debility  History of Present Illness: 73 year old right-handed female history of hypertension, chronic back pain maintained on oxycodone as needed, type 2 diabetes mellitus, hyperlipidemia, asthma using oxygen as needed at night, chronic diastolic congestive heart failure with ejection fraction 25%, CVA, CAD with CABG, atrial fibrillation maintained on Eliquis as well as Plavix followed by Dr. Loralie Champagne, CKD stage III, OSA not on CPAP.  Presented 09/09/2018 with transient slurred speech, dizziness, vomiting as well as falls x3 without loss of consciousness.   Noted bruises to left upper back and left leg.  WBC 14,500, creatinine 1.13, lactic acid 1.2, COVID negative, urinalysis negative, potassium 3.3, low-grade fever 100.9, blood pressure 177/71.  Chest x-ray showed cardiomegaly and vascular congestion with mild pulmonary edema as well as developing bronchopneumonia right lower lobe.  MRSA PCR screening positive.  X-rays of left femur left rib negative for fracture.  CT of the head as well as MRI and MRA showed no acute ischemic infarct or intracranial hemorrhage.  Severe intracranial atherosclerosis noted.  Severe stenosis of left PCA P1 and P2 segments unchanged from compared to 06/01/2014.  Severe stenosis of both internal carotid arteries at the skull base progressed compared to 06/01/2014.  Neurology follow-up to address MRI suspect subacute to chronic right frontal lobe infarction new from prior scan of February 2020.  It was advised to continue Eliquis and Plavix as prior to admission.  Patient was maintained on vancomycin and cefepime for fevers leukocytosis improved change to Levaquin 812 20203 doses.  She did receive low-dose diuresis monitoring for signs of hypoxia with noted CO2 up to 42.  Tolerating a regular diet.  She was admitted to inpatient rehab services 09/14/2018.    Patient was slow but progressive gains.  On 8/16-8/17/2020 patient noted to be hypotensive with systolic's  in the 08X and diastolic in the 44Y.  Cardiology had initially been consulted 09/16/2018 and Demadex was held placed on IV fluids noted elevation in creatinine from 1.688 1420 20-3.14 09/18/2018.  She initially remained on Norvasc that have been decreased to 5 mg as well as losartan and Toprol.  A follow-up echocardiogram showed ejection fraction of 50% mildly reduced systolic function.  Patient received a total of 1 L IV fluids but continued to be hypotensive in addition to bradycardic heart rate ranging 30s to 40s as well as leukocytosis 17,800 and she was discharged to acute care  services hospitalist team 09/18/2018.  She was placed on dopamine drip initially hypotension suspect secondary to volume depletion from over diuresis and diuretics remained on hold.  A cranial CT scan was completed due to some questionable increasing slurred speech that was unremarkable.  Heart failure team continue to follow with medication changes as directed low-dose Demadex initiated.  Creatinine rebounded nicely to 1.83 also monitoring of hypokalemia.  She remained on Eliquis as well as Plavix for her atrial fibrillation.  Blood pressure  normalizing and therapies were reinitiated and patient tolerating nicely.    Patient's medical record from [facility]  has been reviewed by the rehabilitation admission coordinator and physician.  Past Medical History  Past Medical History:  Diagnosis Date  . Abnormality of gait 05/14/2014  . Anemia    hx  . Anginal pain ([place])   . Anxiety   . Asthma   . Basal cell carcinoma 05/2014   "left shoulder"  . Bundle branch block, left    chronic/notes 07/18/2013  . CHF (congestive heart failure) ([place])   . Chronic bronchitis ([place])    "off and on all the time" (07/18/2013)  . Chronic insomnia 05/06/2015  . Chronic kidney disease    frequency, sees dr Jamal Maes every 4 to 6 months (01/16/2018)  . Chronic low back pain 08/24/2016  . Chronic lower back pain   . Claustrophobia   . Common migraine 05/14/2014  . Coronary artery disease   . Depression   . Dysrhythmia   . GERD (gastroesophageal reflux disease)   . H/O hiatal hernia   . Headache    "at least 2/month" (01/16/2018)  . Heart murmur   . Hyperlipidemia   . Hypertension   . Irregular heart beat   . Leg cramps    both legs at times  . Melanoma ([place]) 05/2014   "burned off BLE" (01/16/2018)  . Memory change 05/14/2014  . Migraine    "5-6/year"  (01/16/2018)  . Myocardial infarction ([place]) 04/1999, 02/2000, 01/2005; 2011; 2014  . Neuromuscular disorder ([place])    ?  . Obesity 01-2010  .  Obstructive sleep apnea    "can't wear machine; I have claustrophobia" (01/16/2018), states she had a 2nd sleep study and does not have sleep apnea, her O2 decreases and now is on 2 L of O2 at night.  . On home oxygen therapy    "2L at night and prn during daytime" (01/16/2018)  . Osteoarthritis    "knees and hands" (01/16/2018)  . Other and unspecified angina pectoris   . Peripheral vascular disease (HCC)    ? numbness, tingling arms and legs  . Pneumonia 2000's   "once"  . PONV (postoperative nausea and vomiting)   . Shortness of breath    with exertion  . Stroke ([place]) 03/22/12   right side brain; denies residual on 07/18/2013)  . Stroke [facility]) Oct. 2015  Affected pt.s balance  . Stroke [facility]) 10/2014   "affected my legs; fully recovered now"; still have sporatic memory issues from this one" (01/16/2018)  . Type II diabetes mellitus ([place])   . Ventral hernia    hx of  . Vomiting    persistent  . Vomiting blood     Family History   family history includes AAA (abdominal aortic aneurysm) in her father and mother; Cancer in her sister; Deep vein thrombosis in her father and mother; Diabetes in her father, paternal aunt, paternal grandmother, paternal uncle, and paternal uncle; Heart attack in her father; Heart attack (age of onset: 5) in her paternal grandfather; Heart disease in her father and paternal uncle; Hyperlipidemia in her father; Hypertension in her father, mother, and sister.  Prior Rehab/Hospitalizations Has the patient had prior rehab or hospitalizations prior to admission? Yes   SNF at [facility] in [place] 04/09/29 until 05/16/2018. At home after d/c as she got out of car, fractured ankle. Returned to SNF until 07/21/2018 when she returned home with no weight bearing restrictions. She used 99 days of SNF benefit.  Has the patient had major surgery during 100 days prior to admission? No   Current Medications  Current Facility-Administered Medications:  .  0.9 %   sodium chloride infusion, , Intravenous, PRN, Shellia Cleverly, MD, Stopped at 09/20/18 0825 .  albuterol (PROVENTIL) (2.5 MG/3ML) 0.083% nebulizer solution 2.5 mg, 2.5 mg, Nebulization, Q6H PRN, Adhikari, Amrit, MD .  ALPRAZolam Duanne Moron) tablet 0.5 mg, 0.5 mg, Oral, BID PRN, Jeannene Patella, Sunil G, MD, 0.5 mg at 09/20/18 2206 .  apixaban (ELIQUIS) tablet 5 mg, 5 mg, Oral, BID, Shellia Cleverly, MD, 5 mg at 09/21/18 9562 .  buPROPion (WELLBUTRIN XL) 24 hr tablet 150 mg, 150 mg, Oral, Daily, Shellia Cleverly, MD, 150 mg at 09/21/18 0920 .  Chlorhexidine Gluconate Cloth 2 % PADS 6 each, 6 each, Topical, Q0600, Gleason, Otilio Carpen, PA-C, 6 each at 09/21/18 0534 .  clopidogrel (PLAVIX) tablet 75 mg, 75 mg, Oral, Daily, Shellia Cleverly, MD, 75 mg at 09/21/18 0920 .  dextromethorphan-guaiFENesin (MUCINEX DM) 30-600 MG per 12 hr tablet 1 tablet, 1 tablet, Oral, BID, Shellia Cleverly, MD, 1 tablet at 09/21/18 0920 .  DULoxetine (CYMBALTA) DR capsule 30 mg, 30 mg, Oral, Daily, Shellia Cleverly, MD, 30 mg at 09/21/18 1308 .  gabapentin (NEURONTIN) capsule 300 mg, 300 mg, Oral, Daily, Gleason, Otilio Carpen, PA-C, 300 mg at 09/21/18 0920 .  insulin aspart (novoLOG) injection 0-15 Units, 0-15 Units, Subcutaneous, TID WC, Shellia Cleverly, MD, 3 Units at 09/21/18 1215 .  insulin glargine (LANTUS) injection 38 Units, 38 Units, Subcutaneous, QHS, Gleason, Otilio Carpen, PA-C, 38 Units at 09/20/18 2207 .  metoprolol succinate (TOPROL-XL) 24 hr tablet 50 mg, 50 mg, Oral, Daily, Adhikari, Amrit, MD, 50 mg at 09/21/18 0920 .  multivitamin with minerals tablet 1 tablet, 1 tablet, Oral, Daily, Shelly Coss, MD, 1 tablet at 09/21/18 0921 .  mupirocin ointment (BACTROBAN) 2 % 1 application, 1 application, Nasal, BID, Gleason, Otilio Carpen, PA-C, 1 application at 65/78/46 9629 .  ondansetron (ZOFRAN) injection 4 mg, 4 mg, Intravenous, Q8H PRN, Kamat, Sunil G, MD .  protein supplement (ENSURE MAX) liquid, 11 oz, Oral, Daily, Adhikari, Amrit, MD, 11  oz at 09/20/18 2100 .  rosuvastatin (CRESTOR) tablet 40 mg, 40 mg, Oral, q1800, Shellia Cleverly, MD, 40 mg at 09/20/18 1700 .  sodium chloride flush (NS) 0.9 % injection 10-40  mL, 10-40 mL, Intracatheter, Q12H, Chesley Mires, MD, 10 mL at 09/21/18 0923 .  sodium chloride flush (NS) 0.9 % injection 10-40 mL, 10-40 mL, Intracatheter, PRN, Chesley Mires, MD, 20 mL at 09/18/18 2100 .  spironolactone (ALDACTONE) tablet 12.5 mg, 12.5 mg, Oral, Daily, Adhikari, Amrit, MD, 12.5 mg at 09/21/18 1211 .  torsemide (DEMADEX) tablet 20 mg, 20 mg, Oral, Daily, Bensimhon, Shaune Pascal, MD, 20 mg at 09/21/18 1033  Patients Current Diet:  Diet Order            Diet - low sodium heart healthy        Diet Carb Modified Fluid consistency: Thin; Room service appropriate? Yes  Diet effective now              Precautions / Restrictions Precautions Precautions: Fall Precaution Comments: hx of falls at home (~8 in last 6 months) Restrictions Weight Bearing Restrictions: No   Has the patient had 2 or more falls or a fall with injury in the past year? Yes Has the patient had 2 or more falls or a fall with injury in the past year? Yes Patient reports 4 falls since d/c from [facility] 07/21/2018. She has issues with low heart rate and BP at times  Prior Activity Level Limited Community (1-2x/wk): decline in function since [place]  Prior Functional Level Self Care: Did the patient need help bathing, dressing, using the toilet or eating? Needed some help  Indoor Mobility: Did the patient need assistance with walking from room to room (with or without device)? Independent  Stairs: Did the patient need assistance with internal or external stairs (with or without device)? Needed some help  Functional Cognition: Did the patient need help planning regular tasks such as shopping or remembering to take medications? Independent  Home Assistive Devices / Equipment Home Assistive Devices/Equipment: Gilford Rile (specify type) Home  Equipment: Walker - 2 wheels, Shower seat  Prior Device Use: Indicate devices/aids used by the patient prior to current illness, exacerbation or injury? Walker  Current Functional Level Cognition  Arousal/Alertness: Awake/alert Overall Cognitive Status: Within Functional Limits for tasks assessed Orientation Level: Oriented X4 Memory: Appears intact    Extremity Assessment (includes Sensation/Coordination)  Upper Extremity Assessment: Generalized weakness  Lower Extremity Assessment: Defer to PT evaluation    ADLs  Overall ADL's : Needs assistance/impaired Eating/Feeding: Independent, Sitting Grooming: Oral care, Wash/dry hands, Wash/dry face, Sitting, Set up Upper Body Bathing: Minimal assistance, Sitting Lower Body Bathing: Minimal assistance, Sit to/from stand Upper Body Dressing : Minimal assistance, Sitting Lower Body Dressing: Minimal assistance, Sit to/from stand Toilet Transfer: Minimal assistance, Ambulation, RW Functional mobility during ADLs: Minimal assistance, +2 for safety/equipment, Rolling walker    Mobility  Overal bed mobility: Needs Assistance Bed Mobility: Supine to Sit Supine to sit: Min assist, HOB elevated General bed mobility comments: pt received in chair    Transfers  Overall transfer level: Needs assistance Equipment used: Rolling walker (2 wheeled) Transfers: Sit to/from Stand, [employer] Transfers Sit to Stand: Min assist Stand pivot transfers: Min assist General transfer comment: steadying assist, slow to rise, good technique    Ambulation / Gait / Stairs / Wheelchair Mobility  Ambulation/Gait General Gait Details: did not attempt    Posture / Balance Dynamic Sitting Balance Sitting balance - Comments: no LOB with donning socks Balance Overall balance assessment: Needs assistance, History of Falls Sitting-balance support: No upper extremity supported, Feet supported Sitting balance-Leahy Scale: Good Sitting balance - Comments: no LOB  with donning socks Standing  balance support: Bilateral upper extremity supported, During functional activity Standing balance-Leahy Scale: Poor Standing balance comment: walker and min assist for static standing. Initial posterior lean in standing    Special needs/care consideration BiPAP/CPAP OSA but not tolerant of CPAP CPM  N/a Continuous Drip IV  N/a Dialysis n/a Life Vest  N/a Oxygen  N/a Special Bed  N/a Trach Size  N/a Wound Vac n/a Skin abrasion and ecchymosis to right buttocks. Stage II; ecchymosis to abdomen, arms and back bilaterally; eczema left elbow; MASD buttocks Bowel mgmt:  LBM 8/19 continent Bladder mgmt:  external catheter Diabetic mgmt:  N/a Behavioral consideration  N/a Chemo/radiation  N/a Spouse, [name] is Media planner   Previous Home Environment  Living Arrangements: Spouse/significant other  Lives With: Spouse Available Help at Discharge: Family, Available 24 hours/day Type of Home: House Home Layout: Two level, 1/2 bath on main level, Bed/bath upstairs Alternate Level Stairs-Rails: Can reach both Alternate Level Stairs-Number of Steps: has a stair lift Home Access: Stairs to enter Entrance Stairs-Rails: None Entrance Stairs-Number of Steps: 1 Bathroom Shower/Tub: Multimedia programmer: Handicapped height Bathroom Accessibility: Yes How Accessible: Accessible via walker [place]: Yes Type of Home Care Services: Home RN, [place] (if known): [facility] Additional Comments: pt hopeful to complete her rehab on CIR  Discharge Living Setting Plans for Discharge Living Setting: Patient's home, Lives with (comment) Type of Home at Discharge: House Discharge Home Layout: Two level, 1/2 bath on main level, Bed/bath upstairs Alternate Level Stairs-Rails: Right, Left, Can reach both Alternate Level Stairs-Number of Steps: flight/has chair lift Discharge Home Access: Level entry Discharge Bathroom Shower/Tub: Walk-in  shower Discharge Bathroom Toilet: Standard Discharge Bathroom Accessibility: Yes How Accessible: Accessible via walker Does the patient have any problems obtaining your medications?: No  Social/Family/Support Systems Patient Roles: Spouse, Parent Contact Information: spouse , [name] Anticipated Caregiver: [name] and daughter, Danton Clap Anticipated Ambulance person Information: see above Ability/Limitations of Caregiver: spouse has own buisiness, lift for vehicles and dtr is RN with [employer]; they arrange 24/7 assist between them Caregiver Availability: 24/7 Discharge Plan Discussed with Primary Caregiver: Yes Is Caregiver In Agreement with Plan?: Yes Does Caregiver/Family have Issues with Lodging/Transportation while Pt is in Rehab?: No  Goals/Additional Needs Patient/Family Goal for Rehab: supervision PT, supervision to [place] OT Expected length of stay: ELOS 5 to 7 days Special Service Needs: Spouse, [name] is visitor designee Pt/Family Agrees to Admission and willing to participate: Yes Program Orientation Provided & Reviewed with Pt/Caregiver Including Roles  & Responsibilities: Yes  Decrease burden of Care through IP rehab admission: n/a  Possible need for SNF placement upon discharge:  Not anticipated  Patient Condition: I have reviewed medical records from [facility] , spoken with CM, and patient. I met with patient at the bedside for inpatient rehabilitation assessment.  Patient will benefit from ongoing PT and OT, can actively participate in 3 hours of therapy a day 5 days of the week, and can make measurable gains during the admission.  Patient will also benefit from the coordinated team approach during an Inpatient Acute Rehabilitation admission.  The patient will receive intensive therapy as well as Rehabilitation physician, nursing, social worker, and care management interventions.  Due to bladder management, bowel management, safety, skin/wound care, disease management,  medication administration, pain management and patient education the patient requires 24 hour a day rehabilitation nursing.  The patient is currently min assist with mobility and basic ADLs.  Discharge setting and therapy post  discharge at home with home health is anticipated.  Patient has agreed to participate in the Acute Inpatient Rehabilitation Program and will admit today.  Preadmission Screen Completed By:  Cleatrice Burke, 09/21/2018 12:35 PM ______________________________________________________________________   Discussed status with Dr. Naaman Plummer  on 09/21/2018 at 1235 and received approval for admission today.  Admission Coordinator:  Cleatrice Burke, RN, time 4132 Date  09/21/2018   Assessment/Plan: Diagnosis: debility 1. Does the need for close, 24 hr/day Medical supervision in concert with the patient's rehab needs make it unreasonable for this patient to be served in a less intensive setting? Yes 2. Co-Morbidities requiring supervision/potential complications: CHF, a fib, volume depletion, htn, prior cva 3. Due to bladder management, bowel management, safety, skin/wound care, disease management, medication administration, pain management and patient education, does the patient require 24 hr/day rehab nursing? Yes 4. Does the patient require coordinated care of a physician, rehab nurse, PT (1-2 hrs/day, 5 days/week) and OT (1-2 hrs/day, 5 days/week) to address physical and functional deficits in the context of the above medical diagnosis(es)? Yes Addressing deficits in the following areas: balance, endurance, locomotion, strength, transferring, bowel/bladder control, bathing, dressing, feeding, grooming, toileting and psychosocial support 5. Can the patient actively participate in an intensive therapy program of at least 3 hrs of therapy 5 days a week? Yes 6. The potential for patient to make measurable gains while on inpatient rehab is excellent 7. Anticipated functional  outcomes upon discharge from inpatients are: modified independent PT, modified independent OT, n/a SLP 8. Estimated rehab length of stay to reach the above functional goals is: 5-7 days 9. Anticipated D/C setting: Home 10. Anticipated post D/C treatments: [place] therapy 11. Overall Rehab/Functional Prognosis: excellent  MD Signature: [name], MD, [facility] 09/21/2018

## 2018-09-21 NOTE — TOC Transition Note (Signed)
Transition of Care [facility]) - CM/SW Discharge Note   Patient Details  Name: [name] [name]: [id number] Date of Birth: 19-May-1945  Transition [facility]) CM/SW Contact:  Sharin Mons, RN Phone Number: 09/21/2018, 3:36 PM   Clinical Narrative:      Per MD pt ready for d/c today. Pt will transition to CIR. Pt with good support system, husband and daughter.Pt states was active with [facility] PTA.    RN to call report prior to discharge to receiving  Rehab. Nurse prior to d/c.  RNCM will sign off for now as intervention is no longer needed. Please consult [place] again if new needs arise.   Final next level of care: IP Rehab Facility Barriers to Discharge: No Barriers Identified   Patient Goals and CMS Choice        Discharge Placement                       Discharge Plan and Services                                     Social Determinants of Health (SDOH) Interventions     Readmission Risk Interventions Readmission Risk Prevention Plan 05/17/2018  Transportation Screening Complete  PCP or Specialist Appt within 3-5 Days Complete  HRI or [place] Complete  Social Work Consult for [place] Planning/Counseling Complete  Palliative Care Screening Not Applicable  Medication Review Press photographer) Complete  Some recent data might be hidden

## 2018-09-21 NOTE — Progress Notes (Signed)
PT Cancellation Note  Patient Details Name: [name] [name] [name]: [id number] DOB: 01-19-1946   Cancelled Treatment:    Reason Eval/Treat Not Completed: Fatigue/lethargy limiting ability to participate(pt stated she is fatigued from just having showered. Noted pt to transfer to CIR today.    Blondell Reveal Kistler PT 09/21/2018  Acute Rehabilitation Services Pager [phone] Office [phone]

## 2018-09-22 ENCOUNTER — Inpatient Hospital Stay (HOSPITAL_COMMUNITY): Payer: Medicare Other | Admitting: Occupational Therapy

## 2018-09-22 ENCOUNTER — Inpatient Hospital Stay (HOSPITAL_COMMUNITY): Payer: Medicare Other

## 2018-09-22 LAB — CBC WITH DIFFERENTIAL/PLATELET
Abs Immature Granulocytes: 0.1 10*3/uL — ABNORMAL HIGH (ref 0.00–0.07)
Basophils Absolute: 0.1 10*3/uL (ref 0.0–0.1)
Basophils Relative: 1 %
Eosinophils Absolute: 0.3 10*3/uL (ref 0.0–0.5)
Eosinophils Relative: 3 %
HCT: 39.2 % (ref 36.0–46.0)
Hemoglobin: 12.4 g/dL (ref 12.0–15.0)
Immature Granulocytes: 1 %
Lymphocytes Relative: 18 %
Lymphs Abs: 2 10*3/uL (ref 0.7–4.0)
MCH: 27.6 pg (ref 26.0–34.0)
MCHC: 31.6 g/dL (ref 30.0–36.0)
MCV: 87.1 fL (ref 80.0–100.0)
Monocytes Absolute: 1.3 10*3/uL — ABNORMAL HIGH (ref 0.1–1.0)
Monocytes Relative: 12 %
Neutro Abs: 7.5 10*3/uL (ref 1.7–7.7)
Neutrophils Relative %: 65 %
Platelets: 279 10*3/uL (ref 150–400)
RBC: 4.5 MIL/uL (ref 3.87–5.11)
RDW: 15.5 % (ref 11.5–15.5)
WBC: 11.2 10*3/uL — ABNORMAL HIGH (ref 4.0–10.5)
nRBC: 0 % (ref 0.0–0.2)

## 2018-09-22 LAB — COMPREHENSIVE METABOLIC PANEL
ALT: 32 U/L (ref 0–44)
AST: 40 U/L (ref 15–41)
Albumin: 3.1 g/dL — ABNORMAL LOW (ref 3.5–5.0)
Alkaline Phosphatase: 108 U/L (ref 38–126)
Anion gap: 12 (ref 5–15)
BUN: 25 mg/dL — ABNORMAL HIGH (ref 8–23)
CO2: 29 mmol/L (ref 22–32)
Calcium: 9.2 mg/dL (ref 8.9–10.3)
Chloride: 98 mmol/L (ref 98–111)
Creatinine, Ser: 1.6 mg/dL — ABNORMAL HIGH (ref 0.44–1.00)
GFR calc Af Amer: 37 mL/min — ABNORMAL LOW (ref >60)
GFR calc non Af Amer: 32 mL/min — ABNORMAL LOW (ref >60)
Glucose, Bld: 146 mg/dL — ABNORMAL HIGH (ref 70–99)
Potassium: 2.7 mmol/L — CL (ref 3.5–5.1)
Sodium: 139 mmol/L (ref 135–145)
Total Bilirubin: 1.2 mg/dL (ref 0.3–1.2)
Total Protein: 6.4 g/dL — ABNORMAL LOW (ref 6.5–8.1)

## 2018-09-22 LAB — GLUCOSE, CAPILLARY
Glucose-Capillary: 157 mg/dL — ABNORMAL HIGH (ref 70–99)
Glucose-Capillary: 220 mg/dL — ABNORMAL HIGH (ref 70–99)
Glucose-Capillary: 148 mg/dL — ABNORMAL HIGH (ref 70–99)
Glucose-Capillary: 231 mg/dL — ABNORMAL HIGH (ref 70–99)

## 2018-09-22 MED ORDER — POTASSIUM CHLORIDE CRYS ER 20 MEQ PO TBCR
20.0000 meq | EXTENDED_RELEASE_TABLET | Freq: Two times a day (BID) | ORAL | Status: DC
  Administered 2018-09-22 – 2018-09-28 (×13): 20 meq via ORAL
  Filled 2018-09-22 (×13): qty 1

## 2018-09-22 MED ORDER — RANOLAZINE ER 500 MG PO TB12
500.0000 mg | ORAL_TABLET | Freq: Two times a day (BID) | ORAL | Status: DC
  Administered 2018-09-22 – 2018-09-28 (×13): 500 mg via ORAL
  Filled 2018-09-22 (×13): qty 1

## 2018-09-22 MED ORDER — ONDANSETRON HCL 4 MG PO TABS
4.0000 mg | ORAL_TABLET | Freq: Two times a day (BID) | ORAL | Status: DC
  Administered 2018-09-22 (×2): 4 mg via ORAL
  Filled 2018-09-22 (×2): qty 1

## 2018-09-22 MED ORDER — ONDANSETRON HCL 4 MG PO TABS
4.0000 mg | ORAL_TABLET | Freq: Two times a day (BID) | ORAL | Status: DC
  Administered 2018-09-23 – 2018-09-28 (×11): 4 mg via ORAL
  Filled 2018-09-22 (×11): qty 1

## 2018-09-22 MED ORDER — POTASSIUM CHLORIDE CRYS ER 20 MEQ PO TBCR
40.0000 meq | EXTENDED_RELEASE_TABLET | Freq: Once | ORAL | Status: AC
  Administered 2018-09-22: 40 meq via ORAL
  Filled 2018-09-22: qty 2

## 2018-09-22 NOTE — Significant Event (Signed)
CRITICAL VALUE ALERT  Critical Value:  Potassium 2.7  Date & Time Notied:  09/22/2018 0920  Provider Notified: Alcide Goodness [facility]  Orders Received/Actions taken: Oral potassium orders entered

## 2018-09-22 NOTE — Progress Notes (Signed)
Inpatient Rehabilitation  Patient information reviewed and entered into eRehab system by Shawnise Peterkin M. Latara Micheli, M.A., CCC/SLP, PPS Coordinator.  Information including medical coding, functional ability and quality indicators will be reviewed and updated through discharge.    

## 2018-09-22 NOTE — IPOC Note (Signed)
Overall Plan of Care [facility]) Patient Details Name: [name] [name] [name]: [id number] DOB: [birth year]  Admitting Diagnosis: [facility] Problems: Principal Problem:   Debility     Functional Problem List: Nursing Edema, Endurance, Medication Management, Pain, Safety, Skin Integrity  PT Balance, Edema, Endurance, Motor, Pain, Sensory, Skin Integrity  OT Balance, Endurance, Safety  SLP    TR         Basic ADL's: OT Grooming, Bathing, Dressing, Toileting     Advanced  ADL's: OT Simple Meal Preparation     Transfers: PT Bed Mobility, Bed to Chair, Car, Manufacturing systems engineer, Metallurgist: PT Ambulation, Stairs     Additional Impairments: OT None  SLP        TR      Anticipated Outcomes Item Anticipated Outcome  Self Feeding    Swallowing      Basic self-care  Media planner Transfers Supervision  Bowel/Bladder  Mod I  Transfers  mod I basic; supervision car  Locomotion  supervision gait and step for home entry  Communication     Cognition     Pain  <3 on a 0-10 pain scale  Safety/Judgment  Mod I   Therapy Plan: PT Intensity: Minimum of 1-2 x/day ,45 to 90 minutes PT Frequency: 5 out of 7 days PT Duration Estimated Length of Stay: 6-9 days OT Intensity: Minimum of 1-2 x/day, 45 to 90 minutes OT Frequency: 5 out of 7 days OT Duration/Estimated Length of Stay: 7-10 days     Due to the current state of emergency, patients may not be receiving their 3-hours of Medicare-mandated therapy.   Team Interventions: Nursing Interventions Patient/Family Education, Pain Management, Medication Management, Discharge Planning, Psychosocial Support, Disease Management/Prevention  PT interventions Ambulation/gait training, Training and development officer, Community reintegration, Discharge planning, Disease management/prevention, DME/adaptive equipment instruction, Functional mobility training, Neuromuscular  re-education, Pain management, Patient/family education, Psychosocial support, Skin care/wound management, Stair training, Therapeutic Activities, Therapeutic Exercise, UE/LE Strength taining/ROM, Wheelchair propulsion/positioning  OT Interventions Training and development officer, Discharge planning, Therapeutic Activities, Self Care/advanced ADL retraining, UE/LE Coordination activities, Functional mobility training, Patient/family education, Therapeutic Exercise, Community reintegration, Engineer, drilling, Neuromuscular re-education, UE/LE Strength taining/ROM  SLP Interventions    TR Interventions    SW/CM Interventions     Barriers to Discharge MD  Medical stability  Nursing Decreased caregiver support, Medication compliance    PT Medical stability    OT      SLP      SW       Team Discharge Planning: Destination: PT-Home ,OT- Home , SLP-  Projected Follow-up: PT-Home health PT, 24 hour supervision/assistance, OT-  Home health OT, 24 hour supervision/assistance, SLP-  Projected Equipment Needs: PT-None recommended by PT, OT- 3 in 1 bedside comode, SLP-  Equipment Details: PT-pt has RW and w/c already, OT-  Patient/family involved in discharge planning: PT- Patient,  OT-Patient, SLP-   MD ELOS: 7-9 days Medical Rehab Prognosis:  Excellent Assessment: The patient has been admitted for CIR therapies with the diagnosis of debility related to multiple medical issues, hx of cva. The team will be addressing functional mobility, strength, stamina, balance, safety, adaptive techniques and equipment, self-care, bowel and bladder mgt, patient and caregiver education, activity tolerance, community reentry. Goals have been set at supervision to mod I with basic self-care and transfers/mobility.   Due to the current state of emergency, patients may not be receiving their 3 hours per day of Medicare-mandated  therapy.    [name] Staggers, MD, FAAPMR      See Team Conference  Notes for weekly updates to the plan of care

## 2018-09-22 NOTE — Evaluation (Signed)
Occupational Therapy Assessment and Plan  Patient Details  Name: [name] [name] [name]: [id number] Date of Birth: July 18, 1945  OT Diagnosis: muscular wasting and disuse atrophy and muscle weakness (generalized) Rehab Potential: Rehab Potential (ACUTE ONLY): Good ELOS: 7-10 days   Today's Date: 09/22/2018 OT Individual Time: 5852-7782 OT Individual Time Calculation (min): 57 min     Problem List:  Patient Active Problem List   Diagnosis Date Noted  . Debility 09/21/2018  . Hypotension 09/18/2018  . Bradycardia 09/18/2018  . AKI (acute kidney injury) ([place]) 09/18/2018  . Pressure injury of skin 09/18/2018  . Ischemic cerebrovascular accident (CVA) of frontal lobe ([place]) 09/13/2018  . Fall 09/09/2018  . Stroke ([place]) 09/09/2018  . Type II diabetes mellitus with renal manifestations ([place]) 09/09/2018  . CKD (chronic kidney disease), stage III ([place]) 09/09/2018  . Sepsis ([place]) 09/09/2018  . Depression with anxiety 09/09/2018  . Slurred speech 09/09/2018  . Ankle fracture, left 05/16/2018  . Amiodarone pulmonary toxicity   . Physical deconditioning   . 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 Acute on chronic congestive heart failure ([place])   . Chronic systolic CHF (congestive heart failure), NYHA class 3 ([place]) 01/16/2018  . Acute on chronic diastolic CHF (congestive heart failure) ([place]) 01/16/2018  . Acute on chronic diastolic (congestive) heart failure ([place]) 01/16/2018  . Atrial fibrillation ([place]) 10/21/2017  . CAD (coronary artery disease) of bypass graft 10/20/2017  . Coronary artery disease of bypass graft of native heart with stable angina pectoris ([place])   . Chronic low back pain 08/24/2016  . Pain of left thumb 08/05/2016  . Nocturnal hypoxia 06/02/2016  . Hoarseness 04/05/2016  . Laryngopharyngeal reflux (LPR) 04/05/2016  . Pharyngoesophageal dysphagia 04/05/2016  . Angina decubitus ([place]) 05/22/2015  . Chronic insomnia 05/06/2015  . Common migraine 05/14/2014  . Abnormality of gait  05/14/2014  . Memory change 05/14/2014  . Aneurysm, cerebral, nonruptured 05/14/2014  . Cramp of limb-Left neck 05/10/2014  . Hematemesis with nausea   . Vomiting blood   . Dizziness and giddiness 09/21/2013  . Atypical chest pain 07/18/2013  . Unstable angina ([place]) 04/09/2013  . Carotid artery stenosis, symptomatic 03/20/2013  . Chronic diastolic CHF (congestive heart failure) ([place]) 09/23/2012  . Cerebrovascular disease 07/10/2012  . Cerebral artery occlusion with cerebral infarction ([place]) 07/10/2012  . Mitral regurgitation 04/15/2012  . TIA (transient ischemic attack) 04/15/2012  . Occlusion and stenosis of carotid artery without mention of cerebral infarction 08/18/2011  . Bariatric surgery status 06/17/2011  . Speech abnormality 03/22/2011  . Dyspnea 02/24/2011  . PAPILLARY MUSCLE DYSFUNCTION, NON-RHEUMATIC 10/09/2008  . UNSPECIFIED VITAMIN D DEFICIENCY 10/24/2007  . MYOCARDIAL INFARCTION, HX OF 10/24/2007  . PERSISTENT VOMITING 10/24/2007  . OSTEOARTHRITIS 10/24/2007  . MIGRAINES, HX OF 10/24/2007  . DM 01/16/2007  . Hyperlipemia 01/16/2007  . Obesity-post failed open gastroplasty 1984  01/16/2007  . OBSTRUCTIVE SLEEP APNEA 01/16/2007  . Essential hypertension 01/16/2007  . Coronary atherosclerosis 01/16/2007  . Asthma 01/16/2007  . GERD 01/16/2007  . VENTRAL HERNIA 01/16/2007    Past Medical History:  Past Medical History:  Diagnosis Date  . Abnormality of gait 05/14/2014  . Anemia    hx  . Anginal pain ([place])   . Anxiety   . Asthma   . Basal cell carcinoma 05/2014   "left shoulder"  . Bundle branch block, left    chronic/notes 07/18/2013  . CHF (congestive heart failure) ([place])   . Chronic bronchitis ([place])    "off and on all  the time" (07/18/2013)  . Chronic insomnia 05/06/2015  . Chronic kidney disease    frequency, sees dr Jamal Maes every 4 to 6 months (01/16/2018)  . Chronic low back pain 08/24/2016  . Chronic lower back pain   . Claustrophobia   .  Common migraine 05/14/2014  . Coronary artery disease   . Depression   . Dysrhythmia   . GERD (gastroesophageal reflux disease)   . H/O hiatal hernia   . Headache    "at least 2/month" (01/16/2018)  . Heart murmur   . Hyperlipidemia   . Hypertension   . Irregular heart beat   . Leg cramps    both legs at times  . Melanoma ([place]) 05/2014   "burned off BLE" (01/16/2018)  . Memory change 05/14/2014  . Migraine    "5-6/year"  (01/16/2018)  . Myocardial infarction ([place]) 04/1999, 02/2000, 01/2005; 2011; 2014  . Neuromuscular disorder ([place])    ?  . Obesity 01-2010  . Obstructive sleep apnea    "can't wear machine; I have claustrophobia" (01/16/2018), states she had a 2nd sleep study and does not have sleep apnea, her O2 decreases and now is on 2 L of O2 at night.  . On home oxygen therapy    "2L at night and prn during daytime" (01/16/2018)  . Osteoarthritis    "knees and hands" (01/16/2018)  . Other and unspecified angina pectoris   . Peripheral vascular disease (H[facility])    ? numbness, tingling arms and legs  . Pneumonia 2000's   "once"  . PONV (postoperative nausea and vomiting)   . Shortness of breath    with exertion  . Stroke ([place]) 03/22/12   right side brain; denies residual on 07/18/2013)  . Stroke [facility]) Oct. 2015   Affected pt.s balance  . Stroke [facility]) 10/2014   "affected my legs; fully recovered now"; still have sporatic memory issues from this one" (01/16/2018)  . Type II diabetes mellitus ([place])   . Ventral hernia    hx of  . Vomiting    persistent  . Vomiting blood    Past Surgical History:  Past Surgical History:  Procedure Laterality Date  . ANKLE FRACTURE SURGERY Left 1970's  . APPENDECTOMY  1970's   w/hysterectomy  . BASAL CELL CARCINOMA EXCISION Left 05/2014   "shoulder" (01/16/2018)  . CARDIAC CATHETERIZATION  10/10/2012   Dr [name].  Marland Kitchen CARDIAC CATHETERIZATION N/A 05/29/2015   Procedure: Right/Left Heart Cath and Coronary/Graft Angiography;  Surgeon: Larey Dresser, MD;  Location: [place] CV LAB;  Service: Cardiovascular;  Laterality: N/A;  . CAROTID ENDARTERECTOMY Left 03/2013  . CAROTID STENT INSERTION Left 03/20/2013   Procedure: CAROTID STENT INSERTION;  Surgeon: [name], MD;  Location: [facility] CATH LAB;  Service: Cardiovascular;  Laterality: Left;  internal carotid  . CEREBRAL ANGIOGRAM N/A 04/05/2011   Procedure: CEREBRAL ANGIOGRAM;  Surgeon: Angelia Mould, MD;  Location: [facility] CATH LAB;  Service: Cardiovascular;  Laterality: N/A;  . CHOLECYSTECTOMY OPEN  2004  . CORONARY ANGIOPLASTY WITH STENT PLACEMENT  01,02,05,06,07,08,11; 04/24/2013   "I've probably got ~ 10 stents by now" (04/24/2013)  . CORONARY ANGIOPLASTY WITH STENT PLACEMENT  06/13/2013   "got 4 stents today" (06/13/2013)  . CORONARY ARTERY BYPASS GRAFT  1220/11   "CABG X5"  . CORONARY STENT INTERVENTION N/A 10/20/2017   Procedure: CORONARY STENT INTERVENTION;  Surgeon: Troy Sine, MD;  Location: [place] CV LAB;  Service: Cardiovascular;  Laterality: N/A;  . ESOPHAGOGASTRODUODENOSCOPY  08/03/2011  Procedure: ESOPHAGOGASTRODUODENOSCOPY (EGD);  Surgeon: Shann Medal, MD;  [facility];  Service: General;  Laterality: N/A;  . ESOPHAGOGASTRODUODENOSCOPY (EGD) WITH PROPOFOL N/A 03/11/2014   Procedure: ESOPHAGOGASTRODUODENOSCOPY (EGD) WITH PROPOFOL;  Surgeon: Lafayette Dragon, MD;  Location: [facility] ENDOSCOPY;  Service: Endoscopy;  Laterality: N/A;  . FRACTURE SURGERY    . gall stone removal  05/2003  . GASTRIC RESTRICTION SURGERY  1984   "stapeling"  . HERNIA REPAIR  2004   "in my stomach; had OR on it twice", wire mesh on 1 hernia  . LEFT HEART CATH AND CORS/GRAFTS ANGIOGRAPHY N/A 07/09/2016   Procedure: LEFT HEART CATH AND CORS/GRAFTS ANGIOGRAPHY;  Surgeon: Larey Dresser, MD;  Location: [place] CV LAB;  Service: Cardiovascular;  Laterality: N/A;  . ORIF ANKLE FRACTURE Left 05/16/2018  . ORIF ANKLE FRACTURE Left 05/16/2018   Procedure: OPEN REDUCTION INTERNAL  FIXATION (ORIF) Left ankle with possible syndesmosis fixation;  Surgeon: Nicholes Stairs, MD;  Location: [facility];  Service: Orthopedics;  Laterality: Left;  184mn  . OVARY SURGERY  1970's   "tumor removed"  . PERCUTANEOUS CORONARY STENT INTERVENTION (PCI-S) N/A 06/13/2013   Procedure: PERCUTANEOUS CORONARY STENT INTERVENTION (PCI-S);  Surgeon: JJettie Booze MD;  [facility];  Service: Cardiovascular;  Laterality: N/A;  . PERCUTANEOUS STENT INTERVENTION N/A 04/24/2013   Procedure: PERCUTANEOUS STENT INTERVENTION;  Surgeon: JJettie Booze MD;  [facility];  Service: Cardiovascular;  Laterality: N/A;  . RIGHT/LEFT HEART CATH AND CORONARY/GRAFT ANGIOGRAPHY N/A 10/20/2017   Procedure: RIGHT/LEFT HEART CATH AND CORONARY/GRAFT ANGIOGRAPHY;  Surgeon: MLarey Dresser MD;  [facility];  Service: Cardiovascular;  Laterality: N/A;  . ROOT CANAL  10/2000  . TIBIA FRACTURE SURGERY Right 1970's   rods and pins  . TOOTH EXTRACTION     "1 on the upper; wisdom tooth on the lower" (01/16/2018)  . TOTAL ABDOMINAL HYSTERECTOMY  1970's   w/ appendectomy    Assessment & Plan Clinical Impression: Patient is a 73y.o. right-handed female history of hypertension, chronic back pain maintained on oxycodone as needed, type 2 diabetes mellitus, hyperlipidemia, asthma using oxygen as needed at night, chronic diastolic congestive heart failure with ejection fraction 25%, CVA, CAD with CABG, atrial fibrillation maintained on Eliquis as well as Plavix followed by Dr. DHildred Priest CKD stage III, OSA not on CPAP. Per chart review she lives with spouse used a rolling walker for mobility reports multiple falls. Husband and daughter assist as needed. Patient had been at a skilled nursing facility until end of July for bouts of pneumonia. They live in a two-level home with bed and bathroom upstairs. Presented 09/09/2018 with transient slurred speech, dizziness, vomiting as well  as falls x3 without loss of consciousness. Noted bruises to left upper back and left leg. WBC 14,500, creatinine 1.13, lactic acid 1.2, COVID negative, urinalysis negative, potassium 3.3, low-grade fever 100.9, blood pressure 177/71. Chest x-ray showed cardiomegaly and vascular congestion with mild pulmonary edema as well as developing bronchopneumonia right lower lobe. MRSA PCR screening positive. X-rays of left femur left rib negative for fracture. CT of the head as well as MRI and MRA showed no acute ischemic infarct or intracranial hemorrhage. Severe intracranial atherosclerosis noted. Severe stenosis of left PCA P1 and P2 segments unchanged from compared to 06/01/2014. Severe stenosis of both internal carotid arteries at the skull base progressed compared to 06/01/2014. Neurology follow-up to address MRI suspect subacute to chronic right frontal lobe infarction new  from prior scan of February 2020. It was advised to continue Eliquis and Plavix as prior to admission. Patient was maintained on vancomycin and cefepime for fevers leukocytosis improved change to Levaquin 812 20203 doses. She did receive low-dose diuresis monitoring for signs of hypoxia with noted CO2 up to 42. Tolerating a regular diet. She was admitted to inpatient rehab services 09/14/2018. Patient was slow but progressive gains. On 8/16-8/17/2020patient noted to be hypotensive with systolics in the 26Z and diastolic in the 12W. Cardiology had initially been consulted 09/16/2018 and Demadex was held placed on IV fluids noted elevation in creatinine from 1.688 1420 20-3.14 09/18/2018. She initially remained on Norvasc that have been decreased to 5 mg as well as losartan and Toprol.A follow-up echocardiogram showed ejection fraction of 50% mildly reduced systolic function. Patient received a total of 1 L IV fluids but continued to be hypotensive in addition to bradycardic heart rate ranging 30s to 40s as well as leukocytosis  17,800 and she was discharged to acute care services hospitalist team 09/18/2018. She was placed on dopamine drip initially hypotension suspect secondary to volume depletion from overdiuresis and diuretics remained on hold. A cranial CT scan was completed due to some questionable increasing slurred speech that was unremarkable. Heart failure team continue to followwith medication changes as directed low-dose Demadex initiated. Creatinine rebounded nicely to 1.83 also monitoring of hypokalemia. She remained on Eliquis as well as Plavix for her atrial fibrillation. Blood pressure normalizing and therapies were reinitiated and patient tolerating nicely. She is readmitted back to inpatient rehab services for comprehensive therapy.   Patient transferred to CIR on 09/21/2018 .    Patient currently requires min-mod with basic self-care skills secondary to muscle weakness, decreased cardiorespiratoy endurance and decreased standing balance and nausea/vomiting.  Prior to hospitalization, patient could complete ADLs with supervision.  Patient will benefit from skilled intervention to increase independence with basic self-care skills prior to discharge home with care partner.  Anticipate patient will require intermittent supervision and follow up home health.  OT - End of Session Activity Tolerance: Tolerates 30+ min activity with multiple rests Endurance Deficit: Yes Endurance Deficit Description: required frequent rest breaks due to fatigue/decreased endurance, also with episode of nausea and vomiting OT Assessment Rehab Potential (ACUTE ONLY): Good OT Patient demonstrates impairments in the following area(s): Balance;Endurance;Safety OT Basic ADL's Functional Problem(s): Grooming;Bathing;Dressing;Toileting OT Advanced ADL's Functional Problem(s): Simple Meal Preparation OT Transfers Functional Problem(s): Toilet;Tub/Shower OT Additional Impairment(s): None OT Plan OT Intensity: Minimum of 1-2  x/day, 45 to 90 minutes OT Frequency: 5 out of 7 days OT Duration/Estimated Length of Stay: 7-10 days OT Treatment/Interventions: Balance/vestibular training;Discharge planning;Therapeutic Activities;Self Care/advanced ADL retraining;UE/LE Coordination activities;Functional mobility training;Patient/family education;Therapeutic Exercise;Community reintegration;DME/adaptive equipment instruction;Neuromuscular re-education;UE/LE Strength taining/ROM OT Basic Self-Care Anticipated Outcome(s): Supervision OT Toileting Anticipated Outcome(s): Supervision OT Bathroom Transfers Anticipated Outcome(s): Supervision OT Recommendation Patient destination: Home Follow Up Recommendations: Home health OT;24 hour supervision/assistance Equipment Recommended: 3 in 1 bedside comode   Skilled Therapeutic Intervention OT eval completed with discussion of rehab process, OT purpose, POC, ELOS, and goals.  ADL assessment completed with bathing and dressing at sit > stand level at sink secondary to feeling weak.  Pt required increased time due to easily distracted by frequent interruptions and requiring cues to return to focus on task.  Pt completed bathing at overall min assist for UB and mod assist for LB bathing due to decreased endurance in standing.  Therapist assisted with washing buttocks to allow pt to focus on static  standing.  After LB bathing, pt vomited due to nausea with bending and standing - RN notified.  Completed LB dressing with mod assist and pt able to complete UB dressing with assist to fasten bra.  Plan to further assess alternatives to increase success with fastening bra.  Pt left upright in w/c with seat belt alarm on and all needs in reach.  OT Evaluation Precautions/Restrictions  Precautions Precautions: Fall Precaution Comments: hx of falls at home (~8 in last 6 months) Restrictions Weight Bearing Restrictions: No General   Vital Signs Therapy Vitals Pulse Rate: 83 BP: (!)  87/65 Patient Position (if appropriate): Standing Pain  Pt with no c/o pain Home Living/Prior Functioning Home Living Family/patient expects to be discharged to:: Private residence Living Arrangements: Spouse/significant other Available Help at Discharge: Family, Available 24 hours/day Type of Home: House Home Access: Stairs to enter [employer] of Steps: 1(x2) Entrance Stairs-Rails: None Home Layout: Bed/bath upstairs, 1/2 bath on main level Alternate Level Stairs-Number of Steps: has a stair lift Bathroom Shower/Tub: Multimedia programmer: Handicapped height Bathroom Accessibility: Yes  Lives With: Spouse IADL History Homemaking Responsibilities: No Current License: Yes Mode of Transportation: Car Prior Function Level of Independence: Requires assistive device for independence, Independent with basic ADLs  Able to Take Stairs?: Yes Driving: Yes 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  Company Pharmacologist vans ADL ADL Grooming: Setup Where Assessed-Grooming: Clinical biochemist Bathing: Supervision/safety Where Assessed-Upper Body Bathing: Wheelchair, Sitting at sink Lower Body Bathing: Minimal assistance Where Assessed-Lower Body Bathing: Wheelchair, Sitting at sink, Standing at sink Upper Body Dressing: Minimal assistance Where Assessed-Upper Body Dressing: Sitting at sink, Wheelchair Lower Body Dressing: Moderate assistance Where Assessed-Lower Body Dressing: Wheelchair, Sitting at sink, Standing at sink Vision Baseline Vision/History: Wears glasses Wears Glasses: At all times Patient Visual Report: No change from baseline Vision Assessment?: No apparent visual deficits Perception  Perception: Within Functional Limits Praxis Praxis: Intact Cognition Overall Cognitive Status: Within Functional Limits for tasks  assessed Arousal/Alertness: Awake/alert Orientation Level: Person;Place;Situation Person: Oriented Place: Oriented Situation: Oriented Year: 2020 Month: August Day of Week: Correct Memory: Appears intact Immediate Memory Recall: Sock;Blue;Bed Memory Recall Sock: Without Cue Memory Recall Blue: Without Cue Memory Recall Bed: Without Cue Attention: Selective Selective Attention: Appears intact Awareness: Appears intact Problem Solving: Appears intact Safety/Judgment: Appears intact Sensation Sensation Light Touch: Impaired Detail Peripheral sensation comments: decreased sensation in L foot/ankle Light Touch Impaired Details: Impaired LLE Hot/Cold: Appears Intact Proprioception: Appears Intact Stereognosis: Appears Intact Coordination Gross Motor Movements are Fluid and Coordinated: Yes Fine Motor Movements are Fluid and Coordinated: Yes Finger Nose Finger Test: [facility] bilaterally Motor  Motor Motor: Other (comment) Motor - Skilled Clinical Observations: generalized weakness/deconditioning Mobility  Bed Mobility Bed Mobility: Rolling Left;Supine to Sit Rolling Left: Contact Guard/Touching assist Supine to Sit: Contact Guard/Touching assist Transfers Sit to Stand: Minimal Assistance - Patient > 75% Stand to Sit: Contact Guard/Touching assist  Trunk/Postural Assessment  Cervical Assessment Cervical Assessment: Exceptions to WFL(limited due to dressing on L side of neck) Thoracic Assessment Thoracic Assessment: (flexed) Lumbar Assessment Lumbar Assessment: (posterior pelvic tilt; body habitus limiting) Postural Control Postural Control: Within Functional Limits  Balance Balance Balance Assessed: Yes Static Sitting Balance Static Sitting - Level of Assistance: 5: Stand by assistance Dynamic Sitting Balance Dynamic Sitting - Level of Assistance: 5: Stand by assistance Static Standing Balance Static Standing - Level of Assistance: 4: Min  assist Dynamic Standing  Balance Dynamic Standing - Level of Assistance: 4: Min assist Extremity/Trunk Assessment RUE Assessment RUE Assessment: Within Functional Limits LUE Assessment LUE Assessment: Within Functional Limits     Refer to Care Plan for Long Term Goals  Recommendations for other services: None    Discharge Criteria: Patient will be discharged from OT if patient refuses treatment 3 consecutive times without medical reason, if treatment goals not met, if there is a change in medical status, if patient makes no progress towards goals or if patient is discharged from hospital.  The above assessment, treatment plan, treatment alternatives and goals were discussed and mutually agreed upon: by patient  Simonne Come 09/22/2018, 12:14 PM

## 2018-09-22 NOTE — Progress Notes (Signed)
[name] [name] PHYSICAL MEDICINE & REHABILITATION PROGRESS NOTE   Subjective/Complaints:  No issues overnite   ROS- neg CP, SOB, N/V/D    Objective:   No results found. Recent Labs    09/22/18 0742  WBC 11.2*  HGB 12.4  HCT 39.2  PLT 279   Recent Labs    09/20/18 0349 09/21/18 0307  NA 136 136  K 2.8* 4.0  CL 96* 99  CO2 29 27  GLUCOSE 132* 130*  BUN 32* 23  CREATININE 1.83* 1.50*  CALCIUM 9.0 9.4   No intake or output data in the 24 hours ending 09/22/18 0837   Physical Exam: Vital Signs Blood pressure (!) 99/44, pulse 76, temperature 98 F (36.7 C), temperature source Oral, resp. rate 18, height 5\' 2"  (1.575 m), weight 104 kg, SpO2 95 %.  General: No acute distress, Morbid obesity  Mood and affect are appropriate HEENT- bruise left side neck IJ removal siteHeart: Regular rate and rhythm no rubs murmurs or extra sounds Lungs: Clear to auscultation, breathing unlabored, no rales or wheezes Abdomen: Positive bowel sounds, soft nontender to palpation, nondistended Extremities: No clubbing, cyanosis, or edema Skin: No evidence of breakdown, no evidence of rash Neurologic: Cranial nerves II through XII intact, motor strength is 4/5 in bilateral deltoid, bicep, tricep, grip, hip flexor, knee extensors, ankle dorsiflexor and plantar flexor Sensory exam normal sensation to light touch and proprioception in bilateral upper and lower extremities Cerebellar exam normal finger to nose to finger as well as heel to shin in bilateral upper and lower extremities Musculoskeletal: Full range of motion in all 4 extremities. No joint swelling    Assessment/Plan: 1. Functional deficits secondary to Debility  which require 3+ hours per day of interdisciplinary therapy in a comprehensive inpatient rehab setting.  Physiatrist is providing close team supervision and 24 hour management of active medical problems listed below.  Physiatrist and rehab team continue to assess barriers to  discharge/monitor patient progress toward functional and medical goals  Care Tool:  Bathing              Bathing assist       Upper Body Dressing/Undressing Upper body dressing        Upper body assist      Lower Body Dressing/Undressing Lower body dressing            Lower body assist       Toileting Toileting    Toileting assist Assist for toileting: Minimal Assistance - Patient > 75%     Transfers Chair/bed transfer  Transfers assist     Chair/bed transfer assist level: Minimal Assistance - Patient > 75%     Locomotion Ambulation   Ambulation assist              Walk 10 feet activity   Assist           Walk 50 feet activity   Assist           Walk 150 feet activity   Assist           Walk 10 feet on uneven surface  activity   Assist           Wheelchair     Assist               Wheelchair 50 feet with 2 turns activity    Assist            Wheelchair 150 feet activity     Assist  Blood pressure (!) 99/44, pulse 76, temperature 98 F (36.7 C), temperature source Oral, resp. rate 18, height 5\' 2"  (1.575 m), weight 104 kg, SpO2 95 %.  Medical Problem List and Plan: 1.Decreased functional mobilitysecondary to subacute to chronic right frontal lobe infarction/acute respiratory failure with sepsis lobar pneumonia as well as hypotension with bradycardia -CIR PT,  OT re evals  2. Antithrombotics: -DVT/anticoagulation:Eliquis -antiplatelet therapy: Continue Plavix 75 mg daily 3. Pain Management:Neurontin 300 mg daily 4. Mood:Cymbalta 30 mg daily, Wellbutrin 150 mg daily, Xanax 0.5 mg twice daily as needed -antipsychotic agents: N/A 5. Neuropsych: This patientiscapable of making decisions on herown behalf. 6. Skin/Wound Care:Routine skin checks 7. Fluids/Electrolytes/Nutrition:Routine in and outs with follow-up chemistries 8.  Hypotension/bradycardia related to sepsis. WBC improved 12,800. Dopamine discontinued. Blood cultures no growth to date. Low-dose Aldactone 12.5 mg daily, Toprol-XL 50 mg daily. Daily monitoring of BP's at rest and with activity Check orthostatic vitals q am, add thigh high ted hose 9. Acute on chronic diastolic congestive heart failure. Demadex 20 mg daily. Follow-up heart failure team -daily weights 10. AKI/CKD stage III. Renal baseline 1.5-1.8. Follow-up chemistries at baseline 1.5 11. PAF. Continue Eliquis. Cardiac rate controlled 12. CAD with CABG continue Plavix. 13. OSA/asthma. Intolerant of CPAP. 14. Diabetes mellitus. Hemoglobin A1c 7.0. Lantus insulin 38 units nightly. Check blood sugars before meals and at bedtime.  CBG (last 3)  Recent Labs    09/21/18 1653 09/21/18 2108 09/22/18 0633  GLUCAP 188* 149* 157*  fair control increase lantus to 43U 15. ID/MRSA PCR screening positive/pneumonia. Levaquin completed. 16hyperlipidemia. Crestor    LOS: 1 days A FACE TO [place] [facility] Andreus Cure 09/22/2018, 8:37 AM

## 2018-09-22 NOTE — Progress Notes (Signed)
Info copied from original assessment completed with pt on 8/14 prior to transfer to acute.  Reviewed info with pt and no changes to info needed. Pt with much brighter, clearer affect than with my original meeting.  Reports she feels "weak but so much more ready to do therapy this time."     Social Work Assessment and Plan   Patient Details  Name: [name] [name] [name]: [id number] Date of Birth: [birth year]  Today's Date: 09/22/2018  Problem List:  Patient Active Problem List   Diagnosis Date Noted  . Debility 09/21/2018  . Hypotension 09/18/2018  . Bradycardia 09/18/2018  . AKI (acute kidney injury) ([place]) 09/18/2018  . Pressure injury of skin 09/18/2018  . Ischemic cerebrovascular accident (CVA) of frontal lobe ([place]) 09/13/2018  . Fall 09/09/2018  . Stroke ([place]) 09/09/2018  . Type II diabetes mellitus with renal manifestations ([place]) 09/09/2018  . CKD (chronic kidney disease), stage III ([place]) 09/09/2018  . Sepsis ([place]) 09/09/2018  . Depression with anxiety 09/09/2018  . Slurred speech 09/09/2018  . Ankle fracture, left 05/16/2018  . Amiodarone pulmonary toxicity   . Physical deconditioning   . 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 Acute on chronic congestive heart failure ([place])   . Chronic systolic CHF (congestive heart failure), NYHA class 3 ([place]) 01/16/2018  . Acute on chronic diastolic CHF (congestive heart failure) ([place]) 01/16/2018  . Acute on chronic diastolic (congestive) heart failure ([place]) 01/16/2018  . Atrial fibrillation ([place]) 10/21/2017  . CAD (coronary artery disease) of bypass graft 10/20/2017  . Coronary artery disease of bypass graft of native heart with stable angina pectoris ([place])   . Chronic low back pain 08/24/2016  . Pain of left thumb 08/05/2016  . Nocturnal hypoxia 06/02/2016  . Hoarseness 04/05/2016  . Laryngopharyngeal reflux (LPR) 04/05/2016  . Pharyngoesophageal dysphagia 04/05/2016  . Angina decubitus ([place]) 05/22/2015  . Chronic insomnia 05/06/2015  .  Common migraine 05/14/2014  . Abnormality of gait 05/14/2014  . Memory change 05/14/2014  . Aneurysm, cerebral, nonruptured 05/14/2014  . Cramp of limb-Left neck 05/10/2014  . Hematemesis with nausea   . Vomiting blood   . Dizziness and giddiness 09/21/2013  . Atypical chest pain 07/18/2013  . Unstable angina ([place]) 04/09/2013  . Carotid artery stenosis, symptomatic 03/20/2013  . Chronic diastolic CHF (congestive heart failure) ([place]) 09/23/2012  . Cerebrovascular disease 07/10/2012  . Cerebral artery occlusion with cerebral infarction ([place]) 07/10/2012  . Mitral regurgitation 04/15/2012  . TIA (transient ischemic attack) 04/15/2012  . Occlusion and stenosis of carotid artery without mention of cerebral infarction 08/18/2011  . Bariatric surgery status 06/17/2011  . Speech abnormality 03/22/2011  . Dyspnea 02/24/2011  . PAPILLARY MUSCLE DYSFUNCTION, NON-RHEUMATIC 10/09/2008  . UNSPECIFIED VITAMIN D DEFICIENCY 10/24/2007  . MYOCARDIAL INFARCTION, HX OF 10/24/2007  . PERSISTENT VOMITING 10/24/2007  . OSTEOARTHRITIS 10/24/2007  . MIGRAINES, HX OF 10/24/2007  . DM 01/16/2007  . Hyperlipemia 01/16/2007  . Obesity-post failed open gastroplasty 1984  01/16/2007  . OBSTRUCTIVE SLEEP APNEA 01/16/2007  . Essential hypertension 01/16/2007  . Coronary atherosclerosis 01/16/2007  . Asthma 01/16/2007  . GERD 01/16/2007  . VENTRAL HERNIA 01/16/2007   Past Medical History:  Past Medical History:  Diagnosis Date  . Abnormality of gait 05/14/2014  . Anemia    hx  . Anginal pain ([place])   . Anxiety   . Asthma   . Basal cell carcinoma 05/2014   "left shoulder"  . Bundle branch block, left    chronic/notes 07/18/2013  .  CHF (congestive heart failure) ([place])   . Chronic bronchitis ([place])    "off and on all the time" (07/18/2013)  . Chronic insomnia 05/06/2015  . Chronic kidney disease    frequency, sees dr Jamal Maes every 4 to 6 months (01/16/2018)  . Chronic low back pain 08/24/2016  .  Chronic lower back pain   . Claustrophobia   . Common migraine 05/14/2014  . Coronary artery disease   . Depression   . Dysrhythmia   . GERD (gastroesophageal reflux disease)   . H/O hiatal hernia   . Headache    "at least 2/month" (01/16/2018)  . Heart murmur   . Hyperlipidemia   . Hypertension   . Irregular heart beat   . Leg cramps    both legs at times  . Melanoma ([place]) 05/2014   "burned off BLE" (01/16/2018)  . Memory change 05/14/2014  . Migraine    "5-6/year"  (01/16/2018)  . Myocardial infarction ([place]) 04/1999, 02/2000, 01/2005; 2011; 2014  . Neuromuscular disorder ([place])    ?  . Obesity 01-2010  . Obstructive sleep apnea    "can't wear machine; I have claustrophobia" (01/16/2018), states she had a 2nd sleep study and does not have sleep apnea, her O2 decreases and now is on 2 L of O2 at night.  . On home oxygen therapy    "2L at night and prn during daytime" (01/16/2018)  . Osteoarthritis    "knees and hands" (01/16/2018)  . Other and unspecified angina pectoris   . Peripheral vascular disease (H[facility])    ? numbness, tingling arms and legs  . Pneumonia 2000's   "once"  . PONV (postoperative nausea and vomiting)   . Shortness of breath    with exertion  . Stroke ([place]) 03/22/12   right side brain; denies residual on 07/18/2013)  . Stroke [facility]) Oct. 2015   Affected pt.s balance  . Stroke [facility]) 10/2014   "affected my legs; fully recovered now"; still have sporatic memory issues from this one" (01/16/2018)  . Type II diabetes mellitus ([place])   . Ventral hernia    hx of  . Vomiting    persistent  . Vomiting blood    Past Surgical History:  Past Surgical History:  Procedure Laterality Date  . ANKLE FRACTURE SURGERY Left 1970's  . APPENDECTOMY  1970's   w/hysterectomy  . BASAL CELL CARCINOMA EXCISION Left 05/2014   "shoulder" (01/16/2018)  . CARDIAC CATHETERIZATION  10/10/2012   Dr [name].  Marland Kitchen CARDIAC CATHETERIZATION N/A 05/29/2015   Procedure: Right/Left Heart Cath  and Coronary/Graft Angiography;  Surgeon: Larey Dresser, MD;  Location: [place] [facility] LAB;  Service: Cardiovascular;  Laterality: N/A;  . CAROTID ENDARTERECTOMY Left 03/2013  . CAROTID STENT INSERTION Left 03/20/2013   Procedure: CAROTID STENT INSERTION;  Surgeon: [name], MD;  Location: [facility] CATH LAB;  Service: Cardiovascular;  Laterality: Left;  internal carotid  . CEREBRAL ANGIOGRAM N/A 04/05/2011   Procedure: CEREBRAL ANGIOGRAM;  Surgeon: Angelia Mould, MD;  Location: [facility] CATH LAB;  Service: Cardiovascular;  Laterality: N/A;  . CHOLECYSTECTOMY OPEN  2004  . CORONARY ANGIOPLASTY WITH STENT PLACEMENT  01,02,05,06,07,08,11; 04/24/2013   "I've probably got ~ 10 stents by now" (04/24/2013)  . CORONARY ANGIOPLASTY WITH STENT PLACEMENT  06/13/2013   "got 4 stents today" (06/13/2013)  . CORONARY ARTERY BYPASS GRAFT  1220/11   "CABG X5"  . CORONARY STENT INTERVENTION N/A 10/20/2017   Procedure: CORONARY STENT INTERVENTION;  Surgeon: Troy Sine,  MD;  Location: [place] CV LAB;  Service: Cardiovascular;  Laterality: N/A;  . ESOPHAGOGASTRODUODENOSCOPY  08/03/2011   Procedure: ESOPHAGOGASTRODUODENOSCOPY (EGD);  Surgeon: Shann Medal, MD;  Location: [facility];  Service: General;  Laterality: N/A;  . ESOPHAGOGASTRODUODENOSCOPY (EGD) WITH PROPOFOL N/A 03/11/2014   Procedure: ESOPHAGOGASTRODUODENOSCOPY (EGD) WITH PROPOFOL;  Surgeon: Lafayette Dragon, MD;  Location: [facility] ENDOSCOPY;  Service: Endoscopy;  Laterality: N/A;  . FRACTURE SURGERY    . gall stone removal  05/2003  . GASTRIC RESTRICTION SURGERY  1984   "stapeling"  . HERNIA REPAIR  2004   "in my stomach; had OR on it twice", wire mesh on 1 hernia  . LEFT HEART CATH AND CORS/GRAFTS ANGIOGRAPHY N/A 07/09/2016   Procedure: LEFT HEART CATH AND CORS/GRAFTS ANGIOGRAPHY;  Surgeon: Larey Dresser, MD;  Location: [place] CV LAB;  Service: Cardiovascular;  Laterality: N/A;  . ORIF ANKLE FRACTURE Left 05/16/2018  . ORIF ANKLE FRACTURE Left  05/16/2018   Procedure: OPEN REDUCTION INTERNAL FIXATION (ORIF) Left ankle with possible syndesmosis fixation;  Surgeon: Nicholes Stairs, MD;  Location: [place];  Service: Orthopedics;  Laterality: Left;  155min  . OVARY SURGERY  1970's   "tumor removed"  . PERCUTANEOUS CORONARY STENT INTERVENTION (PCI-S) N/A 06/13/2013   Procedure: PERCUTANEOUS CORONARY STENT INTERVENTION (PCI-S);  Surgeon: [name], MD;  [facility] CATH LAB;  Service: Cardiovascular;  Laterality: N/A;  . PERCUTANEOUS STENT INTERVENTION N/A 04/24/2013   Procedure: PERCUTANEOUS STENT INTERVENTION;  Surgeon: [name], MD;  Location: [facility] CATH LAB;  Service: Cardiovascular;  Laterality: N/A;  . RIGHT/LEFT HEART CATH AND CORONARY/GRAFT ANGIOGRAPHY N/A 10/20/2017   Procedure: RIGHT/LEFT HEART CATH AND CORONARY/GRAFT ANGIOGRAPHY;  Surgeon: Larey Dresser, MD;  Location: [place] CV LAB;  Service: Cardiovascular;  Laterality: N/A;  . ROOT CANAL  10/2000  . TIBIA FRACTURE SURGERY Right 1970's   rods and pins  . TOOTH EXTRACTION     "1 on the upper; wisdom tooth on the lower" (01/16/2018)  . TOTAL ABDOMINAL HYSTERECTOMY  1970's   w/ appendectomy   Social History:  reports that she has never smoked. She has never used smokeless tobacco. She reports that she does not drink alcohol or use drugs.  Family / Support Systems Marital Status: Married Patient Roles: Spouse, Parent Spouse/Significant Other: Tarra Pence @ (H) [phone] or (C) [phone] Children: daughter, Dalphine Handing (local) @ [phone] Anticipated Caregiver: [name] and daughter, Danton Clap Ability/Limitations of Caregiver: spouse has own buisiness, lift for vehicles and dtr is RN with [employer]; they arrange 24/7 assist between them Caregiver Availability: 24/7 Family Dynamics: Pt describes spouse and daughter as very supportive and they have been caregivers for her really since Feb 2020.  Social History Preferred language: English Religion:  Baptist Cultural Background: NA Read: Yes Write: Yes Employment Status: Retired Public relations account executive Issues: None Guardian/Conservator: None - per MD, pt is capable of making decisions on her own behalf.   Abuse/Neglect Abuse/Neglect Assessment Can Be Completed: Yes Physical Abuse: Denies Verbal Abuse: Denies Sexual Abuse: Denies Exploitation of patient/patient's resources: Denies Self-Neglect: Denies  Emotional Status Pt's affect, behavior and adjustment status: Pt very pleasant and pleased to be here on CIR (vs SNF).  She admits much frustration with multiple health issues and several hospital and SNF admissions since Feb.  She is optimistic about out therapy program and being able to regain some independence.  Denies any significant emotional distress, however, will monitor and refer for neuropsychology if indicated. Recent Psychosocial Issues:  As noted, pt has only been in her home ~ 5-6 weeks before returning to hospital and SNF since February which has been stressful for pt and family. Psychiatric History: Pt notes she does have h/o anxiety, depression but managed with medication. Substance Abuse History: NA  Patient / Family Perceptions, Expectations & Goals Pt/Family understanding of illness & functional limitations: Pt and family with good understanding of her medical issues and current level of debility/ need for CIR. Premorbid pt/family roles/activities: Pt did require some assistance with ADLs but overall independent with mobility. Anticipated changes in roles/activities/participation: Little change anticipate if able to reach supervision goals.  Spouse and daughter to resume caregiver assist roles. Pt/family expectations/goals: "I just hope I can get a lot stronger.  Be less of a burden on my family."  [employer]: None Premorbid Home Care/DME Agencies: Other (Comment)([facility]) Transportation available at discharge: yes Resource  referrals recommended: Neuropsychology  Discharge Planning Living Arrangements: Spouse/significant other Support Systems: Spouse/significant other, Children Type of Residence: Private residence Insurance Resources: [employer], Multimedia programmer (specify)(A[employer]) Financial Resources: Radio broadcast assistant Screen Referred: No Living Expenses: Medical laboratory scientific officer Management: Spouse Does the patient have any problems obtaining your medications?: No Home Management: mostly spouse since Feb Patient/Family Preliminary Plans: Pt to d/c home with spouse and daughter providing 24/7 supervision/ assist Social Work Anticipated Follow Up Needs: HH/OP Expected length of stay: 7-12 days  Clinical Impression Very pleasant woman here for debility due to multiple medical issues possible subacute stroke and PNA.  Good family assistance from spouse and daughter who can resume 24/7 support.  Pt with ongoing medical issues since Feb with hospitalization and SNF x2.  Mood appears stable but does have h/o depression and anxiety - will monitor.    Denesia Donelan 09/22/2018, 10:47 AM

## 2018-09-22 NOTE — Evaluation (Addendum)
Physical Therapy Assessment and Plan  Patient Details  Name: STEPHANE JUNKINS [name]: [id number] Date of Birth: 1945/08/26  PT Diagnosis: Difficulty walking, Dizziness and giddiness, Edema, Impaired sensation, Muscle weakness and Pain in nec Rehab Potential: Good ELOS: 6-9 days   Today's Date: 09/22/2018 PT Individual Time: 0806-0905 PT Individual Time Calculation (min): 59 min    Problem List:  Patient Active Problem List   Diagnosis Date Noted  . Debility 09/21/2018  . Hypotension 09/18/2018  . Bradycardia 09/18/2018  . AKI (acute kidney injury) ([place]) 09/18/2018  . Pressure injury of skin 09/18/2018  . Ischemic cerebrovascular accident (CVA) of frontal lobe ([place]) 09/13/2018  . Fall 09/09/2018  . Stroke ([place]) 09/09/2018  . Type II diabetes mellitus with renal manifestations ([place]) 09/09/2018  . CKD (chronic kidney disease), stage III ([place]) 09/09/2018  . Sepsis ([place]) 09/09/2018  . Depression with anxiety 09/09/2018  . Slurred speech 09/09/2018  . Ankle fracture, left 05/16/2018  . Amiodarone pulmonary toxicity   . Physical deconditioning   . 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 Acute on chronic congestive heart failure ([place])   . Chronic systolic CHF (congestive heart failure), NYHA class 3 ([place]) 01/16/2018  . Acute on chronic diastolic CHF (congestive heart failure) ([place]) 01/16/2018  . Acute on chronic diastolic (congestive) heart failure ([place]) 01/16/2018  . Atrial fibrillation ([place]) 10/21/2017  . CAD (coronary artery disease) of bypass graft 10/20/2017  . Coronary artery disease of bypass graft of native heart with stable angina pectoris ([place])   . Chronic low back pain 08/24/2016  . Pain of left thumb 08/05/2016  . Nocturnal hypoxia 06/02/2016  . Hoarseness 04/05/2016  . Laryngopharyngeal reflux (LPR) 04/05/2016  . Pharyngoesophageal dysphagia 04/05/2016  . Angina decubitus ([place]) 05/22/2015  . Chronic insomnia 05/06/2015  . Common migraine 05/14/2014  . Abnormality of gait  05/14/2014  . Memory change 05/14/2014  . Aneurysm, cerebral, nonruptured 05/14/2014  . Cramp of limb-Left neck 05/10/2014  . Hematemesis with nausea   . Vomiting blood   . Dizziness and giddiness 09/21/2013  . Atypical chest pain 07/18/2013  . Unstable angina ([place]) 04/09/2013  . Carotid artery stenosis, symptomatic 03/20/2013  . Chronic diastolic CHF (congestive heart failure) ([place]) 09/23/2012  . Cerebrovascular disease 07/10/2012  . Cerebral artery occlusion with cerebral infarction ([place]) 07/10/2012  . Mitral regurgitation 04/15/2012  . TIA (transient ischemic attack) 04/15/2012  . Occlusion and stenosis of carotid artery without mention of cerebral infarction 08/18/2011  . Bariatric surgery status 06/17/2011  . Speech abnormality 03/22/2011  . Dyspnea 02/24/2011  . PAPILLARY MUSCLE DYSFUNCTION, NON-RHEUMATIC 10/09/2008  . UNSPECIFIED VITAMIN D DEFICIENCY 10/24/2007  . MYOCARDIAL INFARCTION, HX OF 10/24/2007  . PERSISTENT VOMITING 10/24/2007  . OSTEOARTHRITIS 10/24/2007  . MIGRAINES, HX OF 10/24/2007  . DM 01/16/2007  . Hyperlipemia 01/16/2007  . Obesity-post failed open gastroplasty 1984  01/16/2007  . OBSTRUCTIVE SLEEP APNEA 01/16/2007  . Essential hypertension 01/16/2007  . Coronary atherosclerosis 01/16/2007  . Asthma 01/16/2007  . GERD 01/16/2007  . VENTRAL HERNIA 01/16/2007    Past Medical History:  Past Medical History:  Diagnosis Date  . Abnormality of gait 05/14/2014  . Anemia    hx  . Anginal pain ([place])   . Anxiety   . Asthma   . Basal cell carcinoma 05/2014   "left shoulder"  . Bundle branch block, left    chronic/notes 07/18/2013  . CHF (congestive heart failure) ([place])   . Chronic bronchitis ([place])    "off and on all  the time" (07/18/2013)  . Chronic insomnia 05/06/2015  . Chronic kidney disease    frequency, sees dr Jamal Maes every 4 to 6 months (01/16/2018)  . Chronic low back pain 08/24/2016  . Chronic lower back pain   . Claustrophobia   .  Common migraine 05/14/2014  . Coronary artery disease   . Depression   . Dysrhythmia   . GERD (gastroesophageal reflux disease)   . H/O hiatal hernia   . Headache    "at least 2/month" (01/16/2018)  . Heart murmur   . Hyperlipidemia   . Hypertension   . Irregular heart beat   . Leg cramps    both legs at times  . Melanoma ([place]) 05/2014   "burned off BLE" (01/16/2018)  . Memory change 05/14/2014  . Migraine    "5-6/year"  (01/16/2018)  . Myocardial infarction ([place]) 04/1999, 02/2000, 01/2005; 2011; 2014  . Neuromuscular disorder ([place])    ?  . Obesity 01-2010  . Obstructive sleep apnea    "can't wear machine; I have claustrophobia" (01/16/2018), states she had a 2nd sleep study and does not have sleep apnea, her O2 decreases and now is on 2 L of O2 at night.  . On home oxygen therapy    "2L at night and prn during daytime" (01/16/2018)  . Osteoarthritis    "knees and hands" (01/16/2018)  . Other and unspecified angina pectoris   . Peripheral vascular disease (HCC)    ? numbness, tingling arms and legs  . Pneumonia 2000's   "once"  . PONV (postoperative nausea and vomiting)   . Shortness of breath    with exertion  . Stroke ([place]) 03/22/12   right side brain; denies residual on 07/18/2013)  . Stroke [facility]) Oct. 2015   Affected pt.s balance  . Stroke [facility]) 10/2014   "affected my legs; fully recovered now"; still have sporatic memory issues from this one" (01/16/2018)  . Type II diabetes mellitus ([place])   . Ventral hernia    hx of  . Vomiting    persistent  . Vomiting blood    Past Surgical History:  Past Surgical History:  Procedure Laterality Date  . ANKLE FRACTURE SURGERY Left 1970's  . APPENDECTOMY  1970's   w/hysterectomy  . BASAL CELL CARCINOMA EXCISION Left 05/2014   "shoulder" (01/16/2018)  . CARDIAC CATHETERIZATION  10/10/2012   Dr [name].  Marland Kitchen CARDIAC CATHETERIZATION N/A 05/29/2015   Procedure: Right/Left Heart Cath and Coronary/Graft Angiography;  Surgeon: Larey Dresser, MD;  Location: [place] [facility] CV LAB;  Service: Cardiovascular;  Laterality: N/A;  . CAROTID ENDARTERECTOMY Left 03/2013  . CAROTID STENT INSERTION Left 03/20/2013   Procedure: CAROTID STENT INSERTION;  Surgeon: [name], MD;  Location: [facility] CATH LAB;  Service: Cardiovascular;  Laterality: Left;  internal carotid  . CEREBRAL ANGIOGRAM N/A 04/05/2011   Procedure: CEREBRAL ANGIOGRAM;  Surgeon: Angelia Mould, MD;  Location: [facility] CATH LAB;  Service: Cardiovascular;  Laterality: N/A;  . CHOLECYSTECTOMY OPEN  2004  . CORONARY ANGIOPLASTY WITH STENT PLACEMENT  01,02,05,06,07,08,11; 04/24/2013   "I've probably got ~ 10 stents by now" (04/24/2013)  . CORONARY ANGIOPLASTY WITH STENT PLACEMENT  06/13/2013   "got 4 stents today" (06/13/2013)  . CORONARY ARTERY BYPASS GRAFT  1220/11   "CABG X5"  . CORONARY STENT INTERVENTION N/A 10/20/2017   Procedure: CORONARY STENT INTERVENTION;  Surgeon: Troy Sine, MD;  Location: [place] CV LAB;  Service: Cardiovascular;  Laterality: N/A;  . ESOPHAGOGASTRODUODENOSCOPY  08/03/2011  Procedure: ESOPHAGOGASTRODUODENOSCOPY (EGD);  Surgeon: Shann Medal, MD;  Location[facility];  Service: General;  Laterality: N/A;  . ESOPHAGOGASTRODUODENOSCOPY (EGD) WITH PROPOFOL N/A 03/11/2014   Procedure: ESOPHAGOGASTRODUODENOSCOPY (EGD) WITH PROPOFOL;  Surgeon: Lafayette Dragon, MD;  Location: [facility] ENDOSCOPY;  Service: Endoscopy;  Laterality: N/A;  . FRACTURE SURGERY    . gall stone removal  05/2003  . GASTRIC RESTRICTION SURGERY  1984   "stapeling"  . HERNIA REPAIR  2004   "in my stomach; had OR on it twice", wire mesh on 1 hernia  . LEFT HEART CATH AND CORS/GRAFTS ANGIOGRAPHY N/A 07/09/2016   Procedure: LEFT HEART CATH AND CORS/GRAFTS ANGIOGRAPHY;  Surgeon: Larey Dresser, MD;  Location: [place] CV LAB;  Service: Cardiovascular;  Laterality: N/A;  . ORIF ANKLE FRACTURE Left 05/16/2018  . ORIF ANKLE FRACTURE Left 05/16/2018   Procedure: OPEN REDUCTION INTERNAL  FIXATION (ORIF) Left ankle with possible syndesmosis fixation;  Surgeon: Nicholes Stairs, MD;  Location: [facility];  Service: Orthopedics;  Laterality: Left;  116mn  . OVARY SURGERY  1970's   "tumor removed"  . PERCUTANEOUS CORONARY STENT INTERVENTION (PCI-S) N/A 06/13/2013   Procedure: PERCUTANEOUS CORONARY STENT INTERVENTION (PCI-S);  Surgeon: JJettie Booze MD;  [facility];  Service: Cardiovascular;  Laterality: N/A;  . PERCUTANEOUS STENT INTERVENTION N/A 04/24/2013   Procedure: PERCUTANEOUS STENT INTERVENTION;  Surgeon: JJettie Booze MD;  [facility];  Service: Cardiovascular;  Laterality: N/A;  . RIGHT/LEFT HEART CATH AND CORONARY/GRAFT ANGIOGRAPHY N/A 10/20/2017   Procedure: RIGHT/LEFT HEART CATH AND CORONARY/GRAFT ANGIOGRAPHY;  Surgeon: MLarey Dresser MD;  [facility];  Service: Cardiovascular;  Laterality: N/A;  . ROOT CANAL  10/2000  . TIBIA FRACTURE SURGERY Right 1970's   rods and pins  . TOOTH EXTRACTION     "1 on the upper; wisdom tooth on the lower" (01/16/2018)  . TOTAL ABDOMINAL HYSTERECTOMY  1970's   w/ appendectomy    Assessment & Plan Clinical Impression: Patient is a 73year old right-handed female history of hypertension, chronic back pain maintained on oxycodone as needed, type 2 diabetes mellitus, hyperlipidemia, asthma using oxygen as needed at night, chronic diastolic congestive heart failure with ejection fraction 25%, CVA, CAD with CABG, atrial fibrillation maintained on Eliquis as well as Plavix followed by Dr. DHildred Priest CKD stage III, OSA not on CPAP. Per chart review she lives with spouse used a rolling walker for mobility reports multiple falls. Husband and daughter assist as needed. Patient had been at a skilled nursing facility until end of July for bouts of pneumonia. They live in a two-level home with bed and bathroom upstairs. Presented 09/09/2018 with transient slurred speech, dizziness, vomiting as  well as falls x3 without loss of consciousness. Noted bruises to left upper back and left leg. WBC 14,500, creatinine 1.13, lactic acid 1.2, COVID negative, urinalysis negative, potassium 3.3, low-grade fever 100.9, blood pressure 177/71. Chest x-ray showed cardiomegaly and vascular congestion with mild pulmonary edema as well as developing bronchopneumonia right lower lobe. MRSA PCR screening positive. X-rays of left femur left rib negative for fracture. CT of the head as well as MRI and MRA showed no acute ischemic infarct or intracranial hemorrhage. Severe intracranial atherosclerosis noted. Severe stenosis of left PCA P1 and P2 segments unchanged from compared to 06/01/2014. Severe stenosis of both internal carotid arteries at the skull base progressed compared to 06/01/2014. Neurology follow-up to address MRI suspect subacute to chronic right frontal lobe infarction new from  prior scan of February 2020. It was advised to continue Eliquis and Plavix as prior to admission. Patient was maintained on vancomycin and cefepime for fevers leukocytosis improved change to Levaquin 812 20203 doses. She did receive low-dose diuresis monitoring for signs of hypoxia with noted CO2 up to 42. Tolerating a regular diet. She was admitted to inpatient rehab services 09/14/2018. Patient was slow but progressive gains. On 8/16-8/17/2020patient noted to be hypotensive with systolics in the 07M and diastolic in the 22Q. Cardiology had initially been consulted 09/16/2018 and Demadex was held placed on IV fluids noted elevation in creatinine from 1.688 1420 20-3.14 09/18/2018. She initially remained on Norvasc that have been decreased to 5 mg as well as losartan and Toprol.A follow-up echocardiogram showed ejection fraction of 50% mildly reduced systolic function. Patient received a total of 1 L IV fluids but continued to be hypotensive in addition to bradycardic heart rate ranging 30s to 40s as well as leukocytosis  17,800 and she was discharged to acute care services hospitalist team 09/18/2018. She was placed on dopamine drip initially hypotension suspect secondary to volume depletion from overdiuresis and diuretics remained on hold. A cranial CT scan was completed due to some questionable increasing slurred speech that was unremarkable. Heart failure team continue to followwith medication changes as directed low-dose Demadex initiated. Creatinine rebounded nicely to 1.83 also monitoring of hypokalemia. She remained on Eliquis as well as Plavix for her atrial fibrillation. Blood pressure normalizing and therapies were reinitiated and patient tolerating nicely. She is readmitted back to inpatient rehab services for comprehensive therapy  09/21/2018 .   Patient currently requires min with mobility secondary to muscle weakness and muscle joint tightness, decreased cardiorespiratoy endurance and decreased sitting balance, decreased standing balance and decreased balance strategies.  Prior to hospitalization, patient was modified independent  with mobility and lived with Spouse in a House home.  Home access is 1(x2)Stairs to enter.  Patient will benefit from skilled PT intervention to maximize safe functional mobility, minimize fall risk and decrease caregiver burden for planned discharge home with 24 hour supervision.  Anticipate patient will benefit from follow up [place] at discharge.  PT - End of Session Activity Tolerance: Tolerates < 10 min activity with changes in vital signs Endurance Deficit: Yes Endurance Deficit Description: low BP this AM (see flowsheets for details); minimal c/o dizziness; significant rest breaks needed due to fatigue/decreased endurance PT Assessment Rehab Potential (ACUTE/IP ONLY): Good PT Barriers to Discharge: Medical stability PT Patient demonstrates impairments in the following area(s): Balance;Edema;Endurance;Motor;Pain;Sensory;Skin Integrity PT Transfers Functional Problem(s):  Bed Mobility;Bed to Chair;Car;Furniture PT Locomotion Functional Problem(s): Ambulation;Stairs PT Plan PT Intensity: Minimum of 1-2 x/day ,45 to 90 minutes PT Frequency: 5 out of 7 days PT Duration Estimated Length of Stay: 6-9 days PT Treatment/Interventions: Ambulation/gait training;Balance/vestibular training;Community reintegration;Discharge planning;Disease management/prevention;DME/adaptive equipment instruction;Functional mobility training;Neuromuscular re-education;Pain management;Patient/family education;Psychosocial support;Skin care/wound management;Stair training;Therapeutic Activities;Therapeutic Exercise;UE/LE Strength taining/ROM;Wheelchair propulsion/positioning PT Transfers Anticipated Outcome(s): mod I basic; supervision car PT Locomotion Anticipated Outcome(s): supervision gait and step for home entry PT Recommendation Follow Up Recommendations: Home health PT;24 hour supervision/assistance Patient destination: Home Equipment Recommended: None recommended by PT Equipment Details: pt has RW and w/c already  Skilled Therapeutic Intervention Evaluation completed (see details above and below) with education on PT POC and goals and individual treatment initiated with focus on overall activity tolerance, functional transfers and initiating of short distance gait with RW, and education in regards to energy conservation. PT donned tedhose with total assist. Pt able to progress  transfers from min to CGA throughout session but very limited upright tolerance due to fatigue and initially some mild orthostasis. Pt able to gait with RW x 10' x 2 during session with slow cadence, wider BOS, and decreased step length. Pt used RW PTA per report and h/o multiple falls. Discussed recommendation of 24/7 supervision initially for safety due to high fall risk due to h/o several falls and multimedical complications over last several months. Set up with breakfast tray at end of session with all needs in  reach.    PT Evaluation Precautions/Restrictions Precautions Precautions: Fall Precaution Comments: hx of falls at home (~8 in last 6 months) Restrictions Weight Bearing Restrictions: No   Vital Signs Seated EOB: 99/44 mmHg Therapy Vitals Pulse Rate: 83 BP: (!) 87/65 Patient Position (if appropriate): Standing mildly symptomatic  Pain  Reports some pain in L neck where bandage is. Medication given and RN aware. 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  Company Research officer, political party: Within Financial controller Praxis: Intact  Cognition Overall Cognitive Status: Within Functional Limits for tasks assessed Arousal/Alertness: Awake/alert Attention: Selective Selective Attention: Appears intact Memory: Appears intact Immediate Memory Recall: Sock;Blue;Bed Memory Recall Sock: Without Cue Memory Recall Blue: Without Cue Memory Recall Bed: Without Cue Awareness: Appears intact Problem Solving: Appears intact Safety/Judgment: Appears intact Sensation Sensation Light Touch: Impaired Detail Peripheral sensation comments: decreased sensation in L foot/ankle Light Touch Impaired Details: Impaired LLE Proprioception: Appears  Intact Coordination Gross Motor Movements are Fluid and Coordinated: Yes Motor  Motor Motor: Other (comment) Motor - Skilled Clinical Observations: generalized weakness/deconditioning  Mobility Bed Mobility Bed Mobility: Rolling Left;Supine to Sit Rolling Left: Contact Guard/Touching assist Supine to Sit: Contact Guard/Touching assist Transfers Transfers: Sit to Stand;Stand to Sit;Stand Pivot Transfers Sit to Stand: Minimal Assistance - Patient > 75% Stand to Sit: Contact Guard/Touching assist Stand Pivot Transfers: Minimal Assistance - Patient > 75% Stand Pivot Transfer Details: Verbal cues for safe use of DME/AE Locomotion  Gait Gait Distance (Feet): 10 Feet Assistive device: Rolling walker Gait Gait Pattern: Impaired  Trunk/Postural Assessment  Cervical Assessment Cervical Assessment: Exceptions to WFL(limited due to dressing on L side of neck) Thoracic Assessment Thoracic Assessment: (flexed) Lumbar Assessment Lumbar Assessment: (posterior pelvic tilt; body habitus limiting) Postural Control Postural Control: Within Functional Limits  Balance Balance Balance Assessed: Yes Static Sitting Balance Static Sitting - Level of Assistance: 5: Stand by assistance Dynamic Sitting Balance Dynamic Sitting - Level of Assistance: 5: Stand by assistance Static Standing Balance Static Standing - Level of Assistance: 4: Min assist Dynamic Standing Balance Dynamic Standing - Level of Assistance: 4: Min assist Extremity Assessment  RUE Assessment RUE Assessment: Within Functional Limits LUE Assessment LUE Assessment: Within Functional Limits RLE Assessment RLE Assessment: (decreased muscular endurance) General Strength Comments: grossly 4+/5 proximal to distal LLE Assessment LLE Assessment: Exceptions to [facility] General Strength Comments: grossly 4/5; decreased muscular endurance; L ankle atrophy  Refer to Care Plan for Long Term Goals  Recommendations for other services:  Neuropsych and Other: due several months of medical treatment/hospital & SNF stays  Discharge Criteria: Patient will be discharged from PT if patient refuses treatment 3 consecutive times without medical reason, if treatment goals not met, if there is a change in medical status, if patient makes no progress towards goals or if patient is discharged from hospital.  The above assessment, treatment plan, treatment alternatives and goals were discussed and mutually agreed upon: by patient    Treatment Session #2: 1318-1431 (73 min) No pain. Reports being very nauseous earlier. Feeling a bit better now but does require some breaks throughout session to allow for feeling to pass. Request to use bathroom at start of session. Functional gait in and out of bathroom with RW with CGA for balance and cues for safe positioning of RW on threshold into bathroom, slow cadence noted. CGA for balance during clothing management and hygiene and used RW for transfers with min assist. Due to decreased endurance, required hand hygiene at sink from seated position. Stair negotiation introduced for home entry practice, pt unable to attempt without railings or support but with rails able to do 1 step x 2 reps with min assist. Discussed progression to trial with RW for step up/down in future session to simulate home entry. Simulated car transfer with overall min assist with attempting to simulate technique into taller van with step in technique as too tall per pt report to sit back on seat. Pt had fall with ankle fx when exiting the car and was very apprehensive. Due to this car not being able to accurately simulate her set up recommended doing real car transfer next week prior to d/c and able to set up time on Monday with husband to practice this. Pt states she will feel much better if able to practice in real. Issued handout and practiced seated HEP for pt to do in the room for LE strengthening including:  Exercises Seated Ankle  Alphabet - 10 reps - 3 sets - 1x daily - 7x weekly Seated Long Arc Quad - 10 reps - 3 sets - 5 hold - 1x daily - 7x weekly Seated March - 10 reps - 3 sets - 1x daily - 7x weekly Seated Hip Adduction Isometrics with Ball - 10 reps - 3 sets - 1x daily - 7x weekly Seated Isometric Hip Abduction - 10 reps - 3 sets - 1x daily - 7x weekly  W/c propulsion for general UE strengthening and endurance and ability for independent mobility in room with CGA progressing to S with cues for technique. Pt able to perform short distance gait throughout session about 10-15' with CGA and slow cadence. End of session set up but then pt with need to use bathroom again and assisted into bathroom with RW with steadying assist as before. Handoff to NT.   Juanna Cao, PT, DPT, CBIS  09/22/2018, 12:14 PM

## 2018-09-22 NOTE — Discharge Instructions (Addendum)
Inpatient Rehab Discharge Instructions  [name] [name] Discharge date and time: No discharge date for patient encounter.   Activities/Precautions/ Functional Status: Activity: activity as tolerated Diet: diabetic diet Wound Care: none needed Functional status:  ___ No restrictions     ___ Walk up steps independently ___ 24/7 supervision/assistance   ___ Walk up steps with assistance ___ Intermittent supervision/assistance  ___ Bathe/dress independently ___ Walk with walker     _x__ Bathe/dress with assistance ___ Walk Independently    ___ Shower independently ___ Walk with assistance    ___ Shower with assistance ___ No alcohol     ___ Return to work/school ________  COMMUNITY REFERRALS UPON DISCHARGE:   Home Health:   PT     OT  Agency:  [facility] Phone:  [phone] Medical Equipment/Items Ordered:  You have all recommended equipment at home already.   GENERAL COMMUNITY RESOURCES FOR PATIENT/FAMILY: Support Groups:  [facility] Stroke Support Group - on hold right now for COVID-19.  Check with [place] about when it will restart.                              Meets the second Thursday of each month from 6 - 7 PM (except June, July, August)                              The . [facility], [address], [place]                              For more information, call [phone]  Special Instructions: No driving smoking or alcohol   My questions have been answered and I understand these instructions. I will adhere to these goals and the provided educational materials after my discharge from the hospital.  Patient/Caregiver Signature _______________________________ Date __________  Clinician Signature _______________________________________ Date __________  Please bring this form and your medication list with you to all your follow-up doctor's appointments.    Information on my medicine - ELIQUIS (apixaban)  This  medication education was reviewed with me or my healthcare representative as part of my discharge preparation.  Why was Eliquis prescribed for you? Eliquis was prescribed for you to reduce the risk of forming blood clots that can cause a stroke if you have a medical condition called atrial fibrillation (a type of irregular heartbeat) OR to reduce the risk of a blood clots forming after orthopedic surgery.  What do You need to know about Eliquis ? Take your Eliquis TWICE DAILY - one tablet in the morning and one tablet in the evening with or without food.  It would be best to take the doses about the same time each day.  If you have difficulty swallowing the tablet whole please discuss with your pharmacist how to take the medication safely.  Take Eliquis exactly as prescribed by your doctor and DO NOT stop taking Eliquis without talking to the doctor who prescribed the medication.  Stopping may increase your risk of developing a new clot or stroke.  Refill your prescription before you run out.  After discharge, you should have regular check-up appointments with your healthcare provider that is prescribing your Eliquis.  In the future your dose may need to be changed if your kidney function or weight changes by a significant amount  or as you get older.  What do you do if you miss a dose? If you miss a dose, take it as soon as you remember on the same day and resume taking twice daily.  Do not take more than one dose of ELIQUIS at the same time.  Important Safety Information A possible side effect of Eliquis is bleeding. You should call your healthcare provider right away if you experience any of the following: ? Bleeding from an injury or your nose that does not stop. ? Unusual colored urine (red or dark brown) or unusual colored stools (red or black). ? Unusual bruising for unknown reasons. ? A serious fall or if you hit your head (even if there is no bleeding).  Some medicines may  interact with Eliquis and might increase your risk of bleeding or clotting while on Eliquis. To help avoid this, consult your healthcare provider or pharmacist prior to using any new prescription or non-prescription medications, including herbals, vitamins, non-steroidal anti-inflammatory drugs (NSAIDs) and supplements.  This website has more information on Eliquis (apixaban): [email or web address].

## 2018-09-22 NOTE — Care Management (Signed)
Inpatient [facility] Individual Statement of Services  Patient Name:  [name] [name]  Date:  09/22/2018  Welcome to the [facility].  Our goal is to provide you with an individualized program based on your diagnosis and situation, designed to meet your specific needs.  With this comprehensive rehabilitation program, you will be expected to participate in at least 3 hours of rehabilitation therapies Monday-Friday, with modified therapy programming on the weekends.  Your rehabilitation program will include the following services:  Physical Therapy (PT), Occupational Therapy (OT), 24 hour per day rehabilitation nursing, Therapeutic Recreaction (TR), Neuropsychology, Case Management (Social Worker), Rehabilitation Medicine, Nutrition Services and Pharmacy Services  Weekly team conferences will be held on Tuesdays to discuss your progress.  Your Social Worker will talk with you frequently to get your input and to update you on team discussions.  Team conferences with you and your family in attendance may also be held.  Expected length of stay: 5-7 days   Overall anticipated outcome: supervision  Depending on your progress and recovery, your program may change. Your Social Worker will coordinate services and will keep you informed of any changes. Your Social Worker's name and contact numbers are listed  below.  The following services may also be recommended but are not provided by the [place] will be made to provide these services after discharge if needed.  Arrangements include referral to agencies that provide these services.  Your insurance has been verified to be:  Medicare; [place] primary doctor is:  Water engineer  Pertinent information will be shared with your doctor and your insurance company.  Social Worker:  [place],  [place] or ([phone]   Information discussed with and copy given to patient by: Lennart Pall, 09/22/2018, 10:54 AM

## 2018-09-22 NOTE — Progress Notes (Signed)
Advanced Heart Failure Rounding Note   Subjective:    Back in CIR. Feels good. No SOB or CP. No dizziness.   SBP 130-140   Objective:   Weight Range:  Vital Signs:   Temp:  [97.8 F (36.6 C)-98.5 F (36.9 C)] 98 F (36.7 C) (08/21 0434) Pulse Rate:  [68-83] 83 (08/21 0837) Resp:  [16-19] 18 (08/21 0434) BP: (87-144)/(44-77) 87/65 (08/21 0837) SpO2:  [95 %-99 %] 95 % (08/21 0434) Weight:  [104 kg-104.2 kg] 104 kg (08/21 0500) Last BM Date: 09/20/18  Weight change: Filed Weights   09/21/18 1555 09/22/18 0500  Weight: 104.2 kg 104 kg   Filed Weights   09/21/18 1555 09/22/18 0500  Weight: 104.2 kg 104 kg   Intake/Output:   Intake/Output Summary (Last 24 hours) at 09/22/2018 1029 Last data filed at 09/22/2018 0900 Gross per 24 hour  Intake 240 ml  Output -  Net 240 ml     Physical Exam: General:  Sitting in chair No resp difficulty HEENT: normal Neck: supple. no JVD. Carotids 2+ bilat; no bruits. No lymphadenopathy or thryomegaly appreciated. Cor: PMI nondisplaced. Regular rate & rhythm. No rubs, gallops or murmurs. Lungs: clear Abdomen: obese soft, nontender, nondistended. No hepatosplenomegaly. No bruits or masses. Good bowel sounds. Extremities: no cyanosis, clubbing, rash, edema Neuro: alert & orientedx3, cranial nerves grossly intact. moves all 4 extremities w/o difficulty. Affect pleasant  Not on telemetry.    Labs: Basic Metabolic Panel: Recent Labs  Lab 09/18/18 1151 09/19/18 0342 09/20/18 0349 09/20/18 1452 09/21/18 0307 09/22/18 0742  NA 133* 133* 136  --  136 139  K 4.3 3.7 2.8*  --  4.0 2.7*  CL 97* 96* 96*  --  99 98  CO2 21* 27 29  --  27 29  GLUCOSE 204* 191* 132*  --  130* 146*  BUN 46* 47* 32*  --  23 25*  CREATININE 3.39* 2.99* 1.83*  --  1.50* 1.60*  CALCIUM 8.8* 9.0 9.0  --  9.4 9.2  MG  --   --   --  2.0  --   --   PHOS  --   --  2.9  --   --   --     Liver Function Tests: Recent Labs  Lab 09/18/18 0023 09/20/18  0349 09/22/18 0742  AST 23  --  40  ALT 19  --  32  ALKPHOS 89  --  108  BILITOT 0.8  --  1.2  PROT 5.1*  --  6.4*  ALBUMIN 2.5* 2.7* 3.1*   No results for input(s): LIPASE, AMYLASE in the last 168 hours. No results for input(s): AMMONIA in the last 168 hours.  CBC: Recent Labs  Lab 09/18/18 0023 09/18/18 1241 09/19/18 0342 09/22/18 0742  WBC 13.5* 17.8* 12.8* 11.2*  NEUTROABS  --   --   --  7.5  HGB 10.4* 11.7* 10.5* 12.4  HCT 32.5* 36.7 32.5* 39.2  MCV 88.1 88.0 87.6 87.1  PLT 204 226 210 279    Cardiac Enzymes: No results for input(s): CKTOTAL, CKMB, CKMBINDEX, TROPONINI in the last 168 hours.  BNP: BNP (last 3 results) Recent Labs    04/10/18 0512 09/10/18 0925 09/18/18 0627  BNP 149.9* >4,500.0* 226.7*    ProBNP (last 3 results) No results for input(s): PROBNP in the last 8760 hours.    Other results:  Imaging: No results found.   Medications:     Scheduled Medications: . apixaban  5 mg Oral BID  . buPROPion  150 mg Oral Daily  . clopidogrel  75 mg Oral Daily  . dextromethorphan-guaiFENesin  1 tablet Oral BID  . DULoxetine  30 mg Oral Daily  . gabapentin  300 mg Oral Daily  . insulin aspart  0-15 Units Subcutaneous TID WC  . insulin glargine  38 Units Subcutaneous QHS  . metoprolol succinate  50 mg Oral Daily  . multivitamin with minerals  1 tablet Oral Daily  . mupirocin ointment  1 application Nasal BID  . potassium chloride  20 mEq Oral BID  . rosuvastatin  40 mg Oral q1800  . spironolactone  12.5 mg Oral Daily  . torsemide  20 mg Oral Daily    Infusions:   PRN Medications: albuterol, ALPRAZolam, sorbitol   Assessment/Plan:   1. Hypotension - suspect due to overdiuresis.  - Resolved. - I checked BP manually today. BP 138/84. Goal SBP 120-140.  2. AKI/ CKD Stage III - Due to overdiuresis - Renal function has been labile.Baseline likely 1.5-1.8 - Peaked 3.3 - Creatinine back to baseline 1.5-1.6  - Now on torsemide 20  daily.Weight stable. Titrate as needed. She drinks a lot of fluid so will need to be make sure we keep up without overdiuresing     3. Chronic Diastolic/Systolic HF - EF 52-84% on echo 09/17/18 - Renal function stable. Continue  sprio 12.5 mg daily.  ble.  - Now on torsemide 20 daily.Weight stable. Titrate as needed. She drinks a lot of fluid so will need to be make sure we keep up without overdiuresing   - Follow daily weights  4. PAF -Regular pulse.  On eliquis 5 mg twice a day . Continue   5. CAD  S/P CABG.  -Occluded native RCA and SVG-RCA. Status post opening of CTO RCA with 4 overlapping Xience DES in 5/15.DES to sequential SVG-OM1/PLOM in 9/19.  9/2020S/P DES .  -No chest pain. Continue plavix 75 mg + crestor 40 mg daily + elqiuis.    6. OSA Intolerant CPAP  We will see again Monday. Call with questions over the weekend.   Length of Stay: 1   Glori Bickers MD 09/22/2018, 10:29 AM  Advanced Heart Failure Team Pager [phone] (M-F; [place])  Please contact [facility] for night-coverage after hours (4p -7a ) and weekends on [email or web address]

## 2018-09-23 ENCOUNTER — Inpatient Hospital Stay (HOSPITAL_COMMUNITY): Payer: Medicare Other

## 2018-09-23 ENCOUNTER — Inpatient Hospital Stay (HOSPITAL_COMMUNITY): Payer: Medicare Other | Admitting: Occupational Therapy

## 2018-09-23 LAB — GLUCOSE, CAPILLARY
Glucose-Capillary: 145 mg/dL — ABNORMAL HIGH (ref 70–99)
Glucose-Capillary: 210 mg/dL — ABNORMAL HIGH (ref 70–99)
Glucose-Capillary: 136 mg/dL — ABNORMAL HIGH (ref 70–99)
Glucose-Capillary: 226 mg/dL — ABNORMAL HIGH (ref 70–99)

## 2018-09-23 LAB — BASIC METABOLIC PANEL
Anion gap: 13 (ref 5–15)
BUN: 34 mg/dL — ABNORMAL HIGH (ref 8–23)
CO2: 30 mmol/L (ref 22–32)
Calcium: 8.8 mg/dL — ABNORMAL LOW (ref 8.9–10.3)
Chloride: 97 mmol/L — ABNORMAL LOW (ref 98–111)
Creatinine, Ser: 2.28 mg/dL — ABNORMAL HIGH (ref 0.44–1.00)
GFR calc Af Amer: 24 mL/min — ABNORMAL LOW (ref >60)
GFR calc non Af Amer: 21 mL/min — ABNORMAL LOW (ref >60)
Glucose, Bld: 140 mg/dL — ABNORMAL HIGH (ref 70–99)
Potassium: 3 mmol/L — ABNORMAL LOW (ref 3.5–5.1)
Sodium: 140 mmol/L (ref 135–145)

## 2018-09-23 LAB — CULTURE, BLOOD (ROUTINE X 2)
Culture: NO GROWTH
Culture: NO GROWTH

## 2018-09-23 MED ORDER — TORSEMIDE 10 MG PO TABS
10.0000 mg | ORAL_TABLET | Freq: Every day | ORAL | Status: DC
  Administered 2018-09-24: 09:00:00 10 mg via ORAL
  Filled 2018-09-23: qty 1

## 2018-09-23 NOTE — Progress Notes (Addendum)
Occupational Therapy Session Note  Patient Details  Name: [name] [name] [name]: [id number] Date of Birth: 12-04-1945  Today's Date: 09/23/2018\ Session 1 OT Individual Time: [phone] OT Individual Time Calculation (min): 70 min   Session 2 OT Individual Time: 1305-1430 OT Individual Time Calculation (min): 85 min   Short Term Goals: Week 1:  OT Short Term Goal 1 (Week 1): STGs equal to LTGs set at supervision level overall.  Skilled Therapeutic Interventions/Progress Updates:  Session 1   Pt greeted sitting in wc and agreeable to OT treatment session. Pt requested to shower today. Pt completed stand-pivot to tub bench with CGA and use of grab bars. Pt able to doff socks using figure 4 position. Bathing completed with overall min A and will benefit from [facility] sponge next bathing/dressing session to increase access to LB. Dressing completed wc at the sink with overall min A to fasten bra.Workedon standing balance/endurance with grooming tasks at the sink. OT educated on friction reducing device to don TED hose. OT assist to don shoes. Pt reports much improved dizziness today and had no episodes of dizziness/weakness throughout session. Pt left seated in wc with safety belt on, call bell in reach, and needs met.   Session 2 Pt greeted sitting in wc finishing lunch and agreeable to OT treatment session. Pt brought to therapy gym in wc, stand-pivot to therapy mat with supervision. OT provided pt with RW bag and educated on ways to use RW bag at home , within BADL/iADL tasks. Worked on standing balance/endurance standing of foam block and reaching in RW bag for cards to place on matching board. Pt tolerated 3, 4 minute standing bouts with close supervision. Pt took extended rest breaks in between sets. Educated on energy conservation techniques within functional tasks. Pt completed obstacle course collecting numbers in RW bag and ambulating in gym with RW and close supervision. Pt took rest break, then  ambulated from main gym to ortho gym=106 feet (RW and CGA/supervision). Pt completed 15 mins on [employer] arm bike on level 3 for UB there-ex and 2 rest breaks. Pt then ambulated 125 feet w/ RW towards room before needing a rest break. OT returned pt to room in wc. Pt reported need to urinate, so pt completed stand-pivot to toilet with close supervision, and CGA when pulling down pants. Pt voided bladder and completed peri-care without assist. Pt washed hands at the sink and was left seated in wc with chair alarm on and needs met. Pt very encouraged by this afternoons session!   Therapy Documentation Precautions:  Precautions Precautions: Fall Precaution Comments: hx of falls at home (~8 in last 6 months) Restrictions Weight Bearing Restrictions: No Pain: Pain Assessment Pain Scale: 0-10 Pain Score: 0-No pain denies pain   Therapy/Group: Individual Therapy  Valma Cava 09/23/2018, 2:52 PM

## 2018-09-23 NOTE — Progress Notes (Signed)
[facility] PHYSICAL MEDICINE & REHABILITATION PROGRESS NOTE   Subjective/Complaints:  Feels well.  No complaints.  She slept well.  She denies shortness of breath.   Objective:   Physical Exam: Vital Signs Blood pressure (!) 143/51, pulse 66, temperature 97.9 F (36.6 C), resp. rate 18, height _0  (1.575 m), weight 101.9 kg, SpO2 95 %.   Well-developed well-nourished female in no acute distress. HEENT exam atraumatic, normocephalic, extraocular muscles are intact. Neck is supple. No jugular venous distention no thyromegaly. Chest clear to auscultation without increased work of breathing. Cardiac exam S1 and S2 are regular. Abdominal exam active bowel sounds, soft, nontender. Extremities no edema. Neurologic exam she is alert   Assessment/Plan: 1. Functional deficits secondary to Debility    Blood pressure (!) 143/51, pulse 66, temperature 97.9 F (36.6 C), resp. rate 18, height _1  (1.575 m), weight 101.9 kg, SpO2 95 %.  Medical Problem List and Plan: 1.Decreased functional mobilitysecondary to subacute to chronic right frontal lobe infarction/acute respiratory failure with sepsis lobar pneumonia as well as hypotension with bradycardia Continue inpatient rehab. 2. Antithrombotics: -DVT/anticoagulation:Eliquis -antiplatelet therapy: Continue Plavix 75 mg daily 3. Pain Management:Neurontin 300 mg daily 4. Mood:Cymbalta 30 mg daily, Wellbutrin 150 mg daily, Xanax 0.5 mg twice daily as needed -antipsychotic agents: N/A 5. Neuropsych: This patientiscapable of making decisions on herown behalf. 6. Skin/Wound Care:Routine skin checks 7. Fluids/Electrolytes/Nutrition: Basic Metabolic Panel:    Component Value Date/Time   NA 140 09/23/2018 0458   K 3.0 (L) 09/23/2018 0458   CL 97 (L) 09/23/2018 0458   CO2 30 09/23/2018 0458   BUN 34 (H) 09/23/2018 0458   CREATININE 2.28 (H) 09/23/2018 0458   CREATININE 1.07 (H) 05/28/2015 1230   GLUCOSE  140 (H) 09/23/2018 0458   CALCIUM 8.8 (L) 09/23/2018 0458    8. Hypotension/bradycardia related to sepsis.  Lab Results  Component Value Date   WBC 11.2 (H) 09/22/2018    9. Acute on chronic diastolic congestive heart failure. Demadex 20 mg daily. Follow-up heart failure team -daily weights 10. AKI/CKD stage III. Renal baseline 1.5-1.8. Follow-up chemistries at baseline 1.5 Creatinine now 2.28.  It was significantly lower yesterday.  On August 17 it was as high as 3.39. I suspect this is all related to fluid status.  Will need to back off on diuretics for now.  Recheck be met in the morning. 11. PAF. Continue Eliquis. Cardiac rate controlled 12. CAD with CABG continue Plavix. 13. OSA/asthma. Intolerant of CPAP. 14. Diabetes mellitus. Hemoglobin A1c 7.0. Lantus insulin 38 units nightly. Check blood sugars before meals and at bedtime.  CBG (last 3)  Recent Labs    09/22/18 2124 09/23/18 0619 09/23/18 1208  GLUCAP 231* 145* 210*  fair control increase lantus to 43U 15. ID/MRSA PCR screening positive/pneumonia. Levaquin completed. 16hyperlipidemia. Crestor    LOS: 2 days A FACE TO FACE EVALUATION WAS PERFORMED  [name] [name] 09/23/2018, 12:49 PM

## 2018-09-23 NOTE — Progress Notes (Signed)
Physical Therapy Session Note  Patient Details  Name: [name] [name] [name]: [id number] Date of Birth: 04/20/1945  Today's Date: 09/23/2018 PT Individual Time: 0955-1100 PT Individual Time Calculation (min): 65 min   Short Term Goals: Week 1:  PT Short Term Goal 1 (Week 1): = LTGs due to LOS  Skilled Therapeutic Interventions/Progress Updates:    Functional gait in and out of bathroom with CGA with RW and performed toilet transfers with supervision today including clothing management and hygiene. Pt able to perform hand hygiene standing at sink in standing today without needing a seated break first with supervision. Dynamic gait through obstacle course to simulate home environment mobility navigating turns (50') and lateral sidestepping (60') with CGA with RW good awareness of RW positioning. Stair negotiation with RW on curb step to simulate home entry with overall min assist x 2 trials with cues for positioning and good carryover on second attempt. Standing therex for functional strengthening and muscular endurance training to aid with overall mobility and upright activity tolerance. Instructed in standing heel raises, toe raises, hip abduction, hamstring curls, and mini squats x 2 sets of 10 reps each with rest breaks encouraged as pt wanting to push herself when visably fatigued and increased effort of breathing observed. O2 sats and HR remained stable throughout. Education on energy conservation and gauging her own body. Pt receptive to all feedback. Making excellent progress overall and able to tolerate much more activity today.   Therapy Documentation Precautions:  Precautions Precautions: Fall Precaution Comments: hx of falls at home (~8 in last 6 months) Restrictions Weight Bearing Restrictions: No   Vital Signs: O2 remained > 97%  HR = 78-88 bpm with activity  Pain: Denies pain     Therapy/Group: Individual Therapy  Canary Brim Ivory Broad, PT, DPT,  CBIS  09/23/2018, 12:01 PM

## 2018-09-24 LAB — BASIC METABOLIC PANEL
Anion gap: 13 (ref 5–15)
BUN: 32 mg/dL — ABNORMAL HIGH (ref 8–23)
CO2: 31 mmol/L (ref 22–32)
Calcium: 9.1 mg/dL (ref 8.9–10.3)
Chloride: 93 mmol/L — ABNORMAL LOW (ref 98–111)
Creatinine, Ser: 2.07 mg/dL — ABNORMAL HIGH (ref 0.44–1.00)
GFR calc Af Amer: 27 mL/min — ABNORMAL LOW (ref >60)
GFR calc non Af Amer: 24 mL/min — ABNORMAL LOW (ref >60)
Glucose, Bld: 198 mg/dL — ABNORMAL HIGH (ref 70–99)
Potassium: 3.3 mmol/L — ABNORMAL LOW (ref 3.5–5.1)
Sodium: 137 mmol/L (ref 135–145)

## 2018-09-24 LAB — GLUCOSE, CAPILLARY
Glucose-Capillary: 139 mg/dL — ABNORMAL HIGH (ref 70–99)
Glucose-Capillary: 186 mg/dL — ABNORMAL HIGH (ref 70–99)
Glucose-Capillary: 206 mg/dL — ABNORMAL HIGH (ref 70–99)
Glucose-Capillary: 222 mg/dL — ABNORMAL HIGH (ref 70–99)

## 2018-09-24 MED ORDER — ACETAMINOPHEN 500 MG PO TABS
500.0000 mg | ORAL_TABLET | Freq: Four times a day (QID) | ORAL | Status: DC | PRN
  Administered 2018-09-24 – 2018-09-28 (×10): 500 mg via ORAL
  Filled 2018-09-24 (×10): qty 1

## 2018-09-24 MED ORDER — SPIRONOLACTONE 25 MG PO TABS
25.0000 mg | ORAL_TABLET | Freq: Every day | ORAL | Status: DC
  Administered 2018-09-25 – 2018-09-28 (×4): 25 mg via ORAL
  Filled 2018-09-24 (×4): qty 1

## 2018-09-24 MED ORDER — TORSEMIDE 20 MG PO TABS
20.0000 mg | ORAL_TABLET | Freq: Every day | ORAL | Status: DC
  Administered 2018-09-25 – 2018-09-28 (×4): 20 mg via ORAL
  Filled 2018-09-24 (×4): qty 1

## 2018-09-24 NOTE — Progress Notes (Signed)
[facility] PHYSICAL MEDICINE & REHABILITATION PROGRESS NOTE   Subjective/Complaints:  Feels well, no complaints.  Thrilled with results of therapy to date Objective:   Physical Exam: Vital Signs Blood pressure (!) 150/78, pulse 67, temperature 98.1 F (36.7 C), temperature source Oral, resp. rate 18, height 5\' 2"  (1.575 m), weight 103.6 kg, SpO2 95 %.  Well-developed well-nourished female in no acute distress. HEENT exam atraumatic, normocephalic, extraocular muscles are intact. Neck is supple. No jugular venous distention no thyromegaly. Chest clear to auscultation without increased work of breathing. Cardiac exam S1 and S2 are regular. Abdominal exam active bowel sounds, soft, nontender. Extremities no edema. Neurologic exam she is alert    Assessment/Plan: 1. Functional deficits secondary to Debility  Medical Problem List and Plan: 1.Decreased functional mobilitysecondary to subacute to chronic right frontal lobe infarction/acute respiratory failure with sepsis lobar pneumonia as well as hypotension with bradycardia Continue inpatient rehab. 2. Antithrombotics: -DVT/anticoagulation:Eliquis -antiplatelet therapy: Continue Plavix 75 mg daily 3. Pain Management:Neurontin 300 mg daily 4. Mood:Cymbalta 30 mg daily, Wellbutrin 150 mg daily, Xanax 0.5 mg twice daily as needed -antipsychotic agents: N/A 5. Neuropsych: This patientiscapable of making decisions on herown behalf. 6. Skin/Wound Care:Routine skin checks 7. Fluids/Electrolytes/Nutrition: Basic Metabolic Panel:    Component Value Date/Time   NA 140 09/23/2018 0458   K 3.0 (L) 09/23/2018 0458   CL 97 (L) 09/23/2018 0458   CO2 30 09/23/2018 0458   BUN 34 (H) 09/23/2018 0458   CREATININE 2.28 (H) 09/23/2018 0458   CREATININE 1.07 (H) 05/28/2015 1230   GLUCOSE 140 (H) 09/23/2018 0458   CALCIUM 8.8 (L) 09/23/2018 0458  note creatinine and improving hypokalemia Labs tomorrow  8.  Hypotension/bradycardia related to sepsis.  Lab Results  Component Value Date   WBC 11.2 (H) 09/22/2018    9. Acute on chronic diastolic congestive heart failure. Demadex 20 mg daily. Follow-up heart failure team -daily weights 10. AKI/CKD stage III. Renal baseline 1.5-1.8. Follow-up chemistries at baseline 1.5 Creatinine now 2.28.  It was significantly lower yesterday.  On August 17 it was as high as 3.39. I suspect this is all related to fluid status.  Will need to back off on diuretics for now.  Recheck bmet in the morning. 11. PAF. Continue Eliquis. Cardiac rate controlled 12. CAD with CABG continue Plavix. 13. OSA/asthma. Intolerant of CPAP. 14. Diabetes mellitus. Hemoglobin A1c 7.0. Lantus insulin 38 units nightly. Check blood sugars before meals and at bedtime.  CBG (last 3)  Recent Labs    09/23/18 1647 09/23/18 2135 09/24/18 0621  GLUCAP 136* 226* 139*  fair control increase lantus to 43U 15. ID/MRSA PCR screening positive/pneumonia. Levaquin completed. 16hyperlipidemia. Crestor    LOS: 3 days A FACE TO FACE EVALUATION WAS PERFORMED  Anddy Wingert H Etan Vasudevan 09/24/2018, 9:54 AM

## 2018-09-25 ENCOUNTER — Inpatient Hospital Stay (HOSPITAL_COMMUNITY): Payer: Medicare Other | Admitting: Occupational Therapy

## 2018-09-25 ENCOUNTER — Ambulatory Visit (HOSPITAL_COMMUNITY): Payer: Medicare Other

## 2018-09-25 ENCOUNTER — Inpatient Hospital Stay (HOSPITAL_COMMUNITY): Payer: Medicare Other

## 2018-09-25 DIAGNOSIS — I5022 Chronic systolic (congestive) heart failure: Secondary | ICD-10-CM

## 2018-09-25 DIAGNOSIS — I951 Orthostatic hypotension: Secondary | ICD-10-CM

## 2018-09-25 DIAGNOSIS — E876 Hypokalemia: Secondary | ICD-10-CM

## 2018-09-25 LAB — GLUCOSE, CAPILLARY
Glucose-Capillary: 109 mg/dL — ABNORMAL HIGH (ref 70–99)
Glucose-Capillary: 229 mg/dL — ABNORMAL HIGH (ref 70–99)
Glucose-Capillary: 128 mg/dL — ABNORMAL HIGH (ref 70–99)
Glucose-Capillary: 151 mg/dL — ABNORMAL HIGH (ref 70–99)

## 2018-09-25 LAB — BASIC METABOLIC PANEL
Anion gap: 11 (ref 5–15)
BUN: 27 mg/dL — ABNORMAL HIGH (ref 8–23)
CO2: 31 mmol/L (ref 22–32)
Calcium: 9.1 mg/dL (ref 8.9–10.3)
Chloride: 98 mmol/L (ref 98–111)
Creatinine, Ser: 1.71 mg/dL — ABNORMAL HIGH (ref 0.44–1.00)
GFR calc Af Amer: 35 mL/min — ABNORMAL LOW (ref >60)
GFR calc non Af Amer: 30 mL/min — ABNORMAL LOW (ref >60)
Glucose, Bld: 118 mg/dL — ABNORMAL HIGH (ref 70–99)
Potassium: 3.4 mmol/L — ABNORMAL LOW (ref 3.5–5.1)
Sodium: 140 mmol/L (ref 135–145)

## 2018-09-25 MED ORDER — POTASSIUM CHLORIDE CRYS ER 20 MEQ PO TBCR
30.0000 meq | EXTENDED_RELEASE_TABLET | Freq: Once | ORAL | Status: AC
  Administered 2018-09-25: 16:00:00 30 meq via ORAL
  Filled 2018-09-25: qty 1

## 2018-09-25 MED ORDER — INSULIN GLARGINE 100 UNIT/ML ~~LOC~~ SOLN
42.0000 [IU] | Freq: Every day | SUBCUTANEOUS | Status: DC
  Administered 2018-09-25 – 2018-09-27 (×3): 42 [IU] via SUBCUTANEOUS
  Filled 2018-09-25 (×4): qty 0.42

## 2018-09-25 NOTE — Progress Notes (Signed)
Physical Therapy Session Note  Patient Details  Name: [name] [name] [name]: [id number] Date of Birth: 1945/07/27  Today's Date: 09/25/2018 PT Individual Time: 0800-0913 PT Individual Time Calculation (min): 73 min   Short Term Goals: Week 1:  PT Short Term Goal 1 (Week 1): = LTGs due to LOS  Skilled Therapeutic Interventions/Progress Updates:    Pt gathered clothing in room with supervision using RW for short distance gait and getting items out of drawers. Dressed EOB with assist for Tedhose and shoes, but performed shirt, bra, and pants independently. Discussed goals and LOS and conference tomorrow. Oral hygiene in standing at sink with supervision and improved overall activity tolerance noted. Functional gait training on unit for endurance, strengthening, and mobility training x 130', x 60', and  X 140' With close supervision with RW. Stair negotiation training for home access practice (does have stair lift to second floor but personal goal to be able to do the stairs) x 4 stairs with bilateral rails and CGA x 2 trials with seated rest break between each. Furniture transfers in ADL apartment to low couch to simulate home environment and transfer on/off regular bed. Discussed carpeted surface gait and overall household mobility. Instructed in standing therex HEP and issued handout. Cues for technique and reminders to allow herself to take rest breaks. Continued education on energy conservation in regards to d/c home. Pt verbalized understanding and in agreement.    Access Code: [email or web address]  URL: [email or web address]  Date: 09/25/2018  Prepared by: Canary Brim   Exercises Standing Heel Raise with Support - 10 reps - 3 sets - 1x daily - 7x weekly Standing Toe Raises at Chair - 10 reps - 3 sets - 1x daily - 7x weekly Standing Hip Abduction with Counter Support - 10 reps - 3 sets - 1x daily - 7x weekly Standing Hamstring Curl with Chair Support - 10 reps - 3 sets - 1x daily - 7x  weekly Mini Squat with Counter Support - 10 reps - 3 sets - 1x daily - 7x weekly   O2 and HR remained WNL throughout session and rest breaks.   Therapy Documentation Precautions:  Precautions Precautions: Fall Precaution Comments: hx of falls at home (~8 in last 6 months) Restrictions Weight Bearing Restrictions: No  Pain:  Denies pain.    Therapy/Group: Individual Therapy  Canary Brim Ivory Broad, PT, DPT, CBIS  09/25/2018, 10:19 AM

## 2018-09-25 NOTE — Progress Notes (Addendum)
Advanced Heart Failure Rounding Note   Subjective:   Denies SOB. Denies CP. Hopes to go home later this week.   Objective:   Weight Range:  Vital Signs:   Temp:  [97.7 F (36.5 C)-98.1 F (36.7 C)] 97.7 F (36.5 C) (08/24 0303) Pulse Rate:  [57-65] 65 (08/24 0303) Resp:  [17-18] 17 (08/24 0303) BP: (145-153)/(46-65) 149/65 (08/24 0303) SpO2:  [95 %-100 %] 95 % (08/24 0303) Weight:  [104.2 kg] 104.2 kg (08/24 0303) Last BM Date: 09/24/18  Weight change: Filed Weights   09/23/18 0500 09/24/18 0434 09/25/18 0303  Weight: 101.9 kg 103.6 kg 104.2 kg   Filed Weights   09/23/18 0500 09/24/18 0434 09/25/18 0303  Weight: 101.9 kg 103.6 kg 104.2 kg   Intake/Output:   Intake/Output Summary (Last 24 hours) at 09/25/2018 0841 Last data filed at 09/24/2018 1846 Gross per 24 hour  Intake 640 ml  Output -  Net 640 ml     Physical Exam: General:  Sitting in the wheel chair. No resp difficulty HEENT: normal Neck: supple. JVP 6-7 . Carotids 2+ bilat; no bruits. No lymphadenopathy or thryomegaly appreciated. Cor: PMI nondisplaced. Regular rate & rhythm. No rubs, gallops or murmurs. Lungs: clear Abdomen: soft, nontender, nondistended. No hepatosplenomegaly. No bruits or masses. Good bowel sounds. Extremities: no cyanosis, clubbing, rash, edema Neuro: alert & orientedx3, cranial nerves grossly intact. moves all 4 extremities w/o difficulty. Affect pleasant  Labs: Basic Metabolic Panel: Recent Labs  Lab 09/20/18 0349 09/20/18 1452 09/21/18 0307 09/22/18 0742 09/23/18 0458 09/24/18 1010 09/25/18 0704  NA 136  --  136 139 140 137 140  K 2.8*  --  4.0 2.7* 3.0* 3.3* 3.4*  CL 96*  --  99 98 97* 93* 98  CO2 29  --  27 29 30 31 31   GLUCOSE 132*  --  130* 146* 140* 198* 118*  BUN 32*  --  23 25* 34* 32* 27*  CREATININE 1.83*  --  1.50* 1.60* 2.28* 2.07* 1.71*  CALCIUM 9.0  --  9.4 9.2 8.8* 9.1 9.1  MG  --  2.0  --   --   --   --   --   PHOS 2.9  --   --   --   --   --   --      Liver Function Tests: Recent Labs  Lab 09/20/18 0349 09/22/18 0742  AST  --  40  ALT  --  32  ALKPHOS  --  108  BILITOT  --  1.2  PROT  --  6.4*  ALBUMIN 2.7* 3.1*   No results for input(s): LIPASE, AMYLASE in the last 168 hours. No results for input(s): AMMONIA in the last 168 hours.  CBC: Recent Labs  Lab 09/18/18 1241 09/19/18 0342 09/22/18 0742  WBC 17.8* 12.8* 11.2*  NEUTROABS  --   --  7.5  HGB 11.7* 10.5* 12.4  HCT 36.7 32.5* 39.2  MCV 88.0 87.6 87.1  PLT 226 210 279    Cardiac Enzymes: No results for input(s): CKTOTAL, CKMB, CKMBINDEX, TROPONINI in the last 168 hours.  BNP: BNP (last 3 results) Recent Labs    04/10/18 0512 09/10/18 0925 09/18/18 0627  BNP 149.9* >4,500.0* 226.7*    ProBNP (last 3 results) No results for input(s): PROBNP in the last 8760 hours.    Other results:  Imaging: No results found.   Medications:     Scheduled Medications: . apixaban  5 mg Oral BID  .  buPROPion  150 mg Oral Daily  . clopidogrel  75 mg Oral Daily  . dextromethorphan-guaiFENesin  1 tablet Oral BID  . DULoxetine  30 mg Oral Daily  . gabapentin  300 mg Oral Daily  . insulin aspart  0-15 Units Subcutaneous TID WC  . insulin glargine  38 Units Subcutaneous QHS  . metoprolol succinate  50 mg Oral Daily  . multivitamin with minerals  1 tablet Oral Daily  . ondansetron  4 mg Oral Q12H  . potassium chloride  20 mEq Oral BID  . ranolazine  500 mg Oral BID  . rosuvastatin  40 mg Oral q1800  . spironolactone  25 mg Oral Daily  . torsemide  20 mg Oral Daily    Infusions:   PRN Medications: acetaminophen, albuterol, ALPRAZolam, sorbitol   Assessment/Plan:   1. Hypotension - suspect due to overdiuresis.  - Resolved.   2. AKI/ CKD Stage III - Due to overdiuresis - Renal function has been labile.Baseline likely 1.5-1.8 - Peaked 3.3 in acute care. Todays creatinine 1.7.  - Stable.   3. Chronic Diastolic/Systolic HF - EF 03-49% on echo  09/17/18 -Volume status stable. Continue sprio 12.5 mg daily and torsemide 20 daily.Weight stable.   4. PAF -Regular pulse.  On eliquis 5 mg twice a day .  5. CAD  S/P CABG.  -Occluded native RCA and SVG-RCA. Status post opening of CTO RCA with 4 overlapping Xience DES in 5/15.DES to sequential SVG-OM1/PLOM in 9/19.   - Continue plavix 75 mg + crestor 40 mg daily + elqiuis.    6. OSA Intolerant CPAP  CIR much appreciated. We will set up follow up.    Length of Stay: Lydia NP-C  09/25/2018, 8:41 AM  Advanced Heart Failure Team Pager [phone] (M-F; 7a - 4p)  Please contact [facility] for night-coverage after hours (4p -7a ) and weekends on [email or web address]  Patient seen with NP, agree with the above note.    She is doing well in CIR.  No dyspnea at rest and with short walks.   She remains in NSR. Creatinine 1.7 today (lower).   General: NAD, obese.  Neck: No JVD, no thyromegaly or thyroid nodule.  Lungs: Clear to auscultation bilaterally with normal respiratory effort. CV: Nondisplaced PMI.  Heart regular S1/S2, no S3/S4, no murmur.  No peripheral edema.   Abdomen: Soft, nontender, no hepatosplenomegaly, no distention.  Skin: Intact without lesions or rashes.  Neurologic: Alert and oriented x 3.  Psych: Normal affect. Extremities: No clubbing or cyanosis.  HEENT: Normal.   No changes to therapy today, continue current regimen.  She will need to continue combination of Eliquis and Plavix until 9/20, then she can stop Plavix.  Echo this admission with EF improved, 45-50%.  Think we can hold off on any repeat cardiac cath. Volume status looks ok on torsemide 20 mg daily.   Loralie Champagne 09/25/2018 1:06 PM

## 2018-09-25 NOTE — Progress Notes (Signed)
[place] PHYSICAL MEDICINE & REHABILITATION PROGRESS NOTE   Subjective/Complaints:  Feeling well. Just moved bowels. bp seems to be holding  ROS: Patient denies fever, rash, sore throat, blurred vision, nausea, vomiting, diarrhea, cough, shortness of breath or chest pain, joint or back pain, headache, or mood change.    Objective:   No results found. No results for input(s): WBC, HGB, HCT, PLT in the last 72 hours. Recent Labs    09/24/18 1010 09/25/18 0704  NA 137 140  K 3.3* 3.4*  CL 93* 98  CO2 31 31  GLUCOSE 198* 118*  BUN 32* 27*  CREATININE 2.07* 1.71*  CALCIUM 9.1 9.1    Intake/Output Summary (Last 24 hours) at 09/25/2018 1145 Last data filed at 09/25/2018 0750 Gross per 24 hour  Intake 880 ml  Output -  Net 880 ml     Physical Exam: Vital Signs Blood pressure (!) 149/65, pulse 65, temperature 97.7 F (36.5 C), temperature source Oral, resp. rate 17, height 5\' 2"  (1.575 m), weight 104.2 kg, SpO2 95 %.  Constitutional: No distress . Vital signs reviewed. HEENT: EOMI, oral membranes moist Neck: supple Cardiovascular: RRR without murmur. No JVD    Respiratory: CTA Bilaterally without wheezes or rales. Normal effort    GI: BS +, non-tender, non-distended  Extremities: No clubbing, cyanosis, or edema Skin: No evidence of breakdown, no evidence of rash Neurologic: Cranial nerves II through XII intact, motor strength is 4/5 in bilateral deltoid, bicep, tricep, grip, hip flexor, knee extensors, ankle dorsiflexor and plantar flexor. Improving standing balance Musculoskeletal: Full range of motion in all 4 extremities. No joint swelling Psych: pleasant and cooperative   Assessment/Plan: 1. Functional deficits secondary to Debility  which require 3+ hours per day of interdisciplinary therapy in a comprehensive inpatient rehab setting.  Physiatrist is providing close team supervision and 24 hour management of active medical problems listed below.  Physiatrist  and rehab team continue to assess barriers to discharge/monitor patient progress toward functional and medical goals  Care Tool:  Bathing    Body parts bathed by patient: Right arm, Left arm, Chest, Abdomen, Front perineal area, Buttocks, Right upper leg, Left upper leg, Face   Body parts bathed by helper: Right lower leg, Left lower leg     Bathing assist Assist Level: Contact Guard/Touching assist     Upper Body Dressing/Undressing Upper body dressing   What is the patient wearing?: Pull over shirt, Bra    Upper body assist Assist Level: Independent    Lower Body Dressing/Undressing Lower body dressing      What is the patient wearing?: Pants     Lower body assist Assist for lower body dressing: Supervision/Verbal cueing     Toileting Toileting    Toileting assist Assist for toileting: Minimal Assistance - Patient > 75%     Transfers Chair/bed transfer  Transfers assist     Chair/bed transfer assist level: Supervision/Verbal cueing     Locomotion Ambulation   Ambulation assist      Assist level: Contact Guard/Touching assist Assistive device: Walker-rolling Max distance: 140'   Walk 10 feet activity   Assist     Assist level: Supervision/Verbal cueing Assistive device: Walker-rolling   Walk 50 feet activity   Assist Walk 50 feet with 2 turns activity did not occur: Safety/medical concerns(low BP/endurance)  Assist level: Contact Guard/Touching assist Assistive device: Walker-rolling    Walk 150 feet activity   Assist Walk 150 feet activity did not occur: Safety/medical concerns(low BP/endurance)  Walk 10 feet on uneven surface  activity   Assist Walk 10 feet on uneven surfaces activity did not occur: Safety/medical concerns(low BP/endurance)         Wheelchair     Assist Will patient use wheelchair at discharge?: Yes(dependent for transport; ambulatory in the home) Type of Wheelchair: Manual    Wheelchair  assist level: Contact Guard/Touching assist Max wheelchair distance: 71'    Wheelchair 50 feet with 2 turns activity    Assist        Assist Level: Contact Guard/Touching assist   Wheelchair 150 feet activity     Assist  Wheelchair 150 feet activity did not occur: Safety/medical concerns(endurance)       Blood pressure (!) 149/65, pulse 65, temperature 97.7 F (36.5 C), temperature source Oral, resp. rate 17, height 5\' 2"  (1.575 m), weight 104.2 kg, SpO2 95 %.  Medical Problem List and Plan: 1.Decreased functional mobilitysecondary to subacute to chronic right frontal lobe infarction/acute respiratory failure with sepsis lobar pneumonia as well as hypotension with bradycardia --Continue CIR therapies including PT, OT  2. Antithrombotics: -DVT/anticoagulation:Eliquis -antiplatelet therapy: Continue Plavix 75 mg daily 3. Pain Management:Neurontin 300 mg daily 4. Mood:Cymbalta 30 mg daily, Wellbutrin 150 mg daily, Xanax 0.5 mg twice daily as needed -antipsychotic agents: N/A 5. Neuropsych: This patientiscapable of making decisions on herown behalf. 6. Skin/Wound Care:Routine skin checks 7. Fluids/Electrolytes/Nutrition: encourage PO  -I personally reviewed the patient's labs today.    -replete K+ 8. Hypotension/bradycardia related to sepsis. WBC improved 12,800. Dopamine discontinued. Blood cultures no growth to date. Low-dose Aldactone 12.5 mg daily, Toprol-XL 50 mg daily. Daily monitoring of BP's at rest and with activity Continue orthostatic vitals q am, remains orthostatic by readings 9. Acute on chronic diastolic congestive heart failure. Demadex 20 mg daily.   Appreciate Follow-up heart failure team -weights up [facility] Weights   09/23/18 0500 09/24/18 0434 09/25/18 0303  Weight: 101.9 kg 103.6 kg 104.2 kg    10. AKI/CKD stage III. Renal baseline 1.5-1.8. CR 8/24 1.7 11. PAF. Continue  Eliquis. Cardiac rate controlled at present 12. CAD with CABG continue Plavix. 13. OSA/asthma. Intolerant of CPAP. 14. Diabetes mellitus. Hemoglobin A1c 7.0. Lantus insulin still at 38 units nightly. Check blood sugars before meals and at bedtime.  CBG (last 3)  Recent Labs    09/24/18 1702 09/24/18 2127 09/25/18 0616  GLUCAP 206* 222* 109*  poor control:increase lantus to 42U 15. ID/MRSA PCR screening positive/pneumonia. Levaquin completed. 16hyperlipidemia. Crestor    LOS: 4 days A FACE TO FACE EVALUATION WAS PERFORMED  [name] 09/25/2018, 11:45 AM

## 2018-09-25 NOTE — Progress Notes (Signed)
Physical Therapy Session Note  Patient Details  Name: [name] [name] [name]: [id number] Date of Birth: 04/21/1945  Today's Date: 09/25/2018 PT Individual Time: 1400-1500 PT Individual Time Calculation (min): 60 min   Short Term Goals: Week 1:  PT Short Term Goal 1 (Week 1): = LTGs due to LOS  Skilled Therapeutic Interventions/Progress Updates:    Focused on family education with pt's husband in regards to real car transfer to [place]. First time with PT requires min assist and problem solved best technique. Return demonstrated with husband and performed close S/CGA with step in technique. Nustep for endurance and strength training x 10' on level 3 with BUE and BLE with no rest break needed. Dynamic gait through obstacle course navigating turns, sidestepping and stepping up/down on compliant surface with close supervision. Seated rest break but able to perform x 2 reps (about 64' each time). Pt with questions about monitoring HR and O2 during activity, education provided and pt reports having a pulse oximeter at home. Discussed also counting outloud during activities to use as a monitor for activity tolerance - ex. Unable or difficult to speak, likely overdid activity and need rest break. Educated husband on these self monitoring techniques as well as energy conservation. Pt able to gait x 120' with close supervision with RW at end of session, still requiring cues for self monitoring/pacing.  Therapy Documentation Precautions:  Precautions Precautions: Fall Precaution Comments: hx of falls at home (~8 in last 6 months) Restrictions Weight Bearing Restrictions: No  Pain:  Denies pain   Therapy/Group: Individual Therapy  Canary Brim Ivory Broad, PT, DPT, CBIS  09/25/2018, 3:04 PM

## 2018-09-25 NOTE — Progress Notes (Signed)
Occupational Therapy Session Note  Patient Details  Name: [name] [name]: [id number] Date of Birth: 1945/08/31  Today's Date: 09/25/2018 OT Individual Time: 1103-1203 OT Individual Time Calculation (min): 60 min    Short Term Goals: Week 1:  OT Short Term Goal 1 (Week 1): STGs equal to LTGs set at supervision level overall.  Skilled Therapeutic Interventions/Progress Updates:    Treatment session with focus on functional mobility and self-care retraining.  Pt ambulated to dresser to gather clothing (on top) and transported clothing in walker bag to bathroom with CGA.  Pt undressed while seated on toilet and ambulated to shower with supervision.  Engaged in bathing at sit > stand level in room shower with supervision.  Completed dressing at sit > stand from toilet, even able to fasten bra this session.  Therapist assisted with donning TEDS and pt able to don shoes with setup assist.  Pt left upright in w/c with all needs in reach.  Pt demonstrating overall increased endurance and activity tolerance this session, O2 and HR WNL.  Therapy Documentation Precautions:  Precautions Precautions: Fall Precaution Comments: hx of falls at home (~8 in last 6 months) Restrictions Weight Bearing Restrictions: No Pain:  Pt reports slight pain in Lt ankle, did not rate.  Reports premedicated   Therapy/Group: Individual Therapy  Simonne Come 09/25/2018, 12:34 PM

## 2018-09-26 ENCOUNTER — Inpatient Hospital Stay (HOSPITAL_COMMUNITY): Payer: Medicare Other | Admitting: Occupational Therapy

## 2018-09-26 ENCOUNTER — Inpatient Hospital Stay (HOSPITAL_COMMUNITY): Payer: Medicare Other

## 2018-09-26 LAB — BASIC METABOLIC PANEL
Anion gap: 9 (ref 5–15)
BUN: 27 mg/dL — ABNORMAL HIGH (ref 8–23)
CO2: 29 mmol/L (ref 22–32)
Calcium: 9.1 mg/dL (ref 8.9–10.3)
Chloride: 103 mmol/L (ref 98–111)
Creatinine, Ser: 1.91 mg/dL — ABNORMAL HIGH (ref 0.44–1.00)
GFR calc Af Amer: 30 mL/min — ABNORMAL LOW (ref >60)
GFR calc non Af Amer: 26 mL/min — ABNORMAL LOW (ref >60)
Glucose, Bld: 100 mg/dL — ABNORMAL HIGH (ref 70–99)
Potassium: 3.8 mmol/L (ref 3.5–5.1)
Sodium: 141 mmol/L (ref 135–145)

## 2018-09-26 LAB — GLUCOSE, CAPILLARY
Glucose-Capillary: 81 mg/dL (ref 70–99)
Glucose-Capillary: 193 mg/dL — ABNORMAL HIGH (ref 70–99)
Glucose-Capillary: 161 mg/dL — ABNORMAL HIGH (ref 70–99)
Glucose-Capillary: 216 mg/dL — ABNORMAL HIGH (ref 70–99)

## 2018-09-26 NOTE — Progress Notes (Signed)
Occupational Therapy Session Note  Patient Details  Name: [name] [name] [name]: [id number] Date of Birth: [birth year]  Today's Date: 09/26/2018 OT Individual Time: 0845-1000 OT Individual Time Calculation (min): 75 min    Short Term Goals: Week 1:  OT Short Term Goal 1 (Week 1): STGs equal to LTGs set at supervision level overall.  Skilled Therapeutic Interventions/Progress Updates:    Treatment session with focus on functional mobility in home environment, simple meal prep, and education on energy conservation.  Pt received upright in w/c already bathed and dressed. Engaged in functional mobility in ADL apt with RW with supervision. Pt completed furniture transfer on/off low couch and in/out of standard bed with supervision, increased time for bed mobility but no physical assistance.  Pt reports husband has changed out mattresses to allow for a lower floor to mattress height for increased success.  Engaged in simulated meal prep in kitchen with pt demonstrating use of counter tops and walker bag for increased safety while transporting items.  Educated on energy conservation strategies in regards to overall self-care tasks and home making tasks as well as modifications to leisure activities to increase success.  Provided pt with energy conservation handouts.  Pt ambulated 120' x2 with supervision with RW back to room, seated rest break in between.  Pt pleased with progress.  Therapy Documentation Precautions:  Precautions Precautions: Fall Precaution Comments: hx of falls at home (~8 in last 6 months) Restrictions Weight Bearing Restrictions: No Pain: Pain Assessment Pain Scale: 0-10 Pain Score: 0-No pain   Therapy/Group: Individual Therapy  Simonne Come 09/26/2018, 11:34 AM

## 2018-09-26 NOTE — Progress Notes (Signed)
Occupational Therapy Session Note  Patient Details  Name: [name] [name] [name]: [id number] Date of Birth: April 09, 1945  Today's Date: 09/26/2018 OT Individual Time: 9532-0233 OT Individual Time Calculation (min): 43 min    Short Term Goals: Week 1:  OT Short Term Goal 1 (Week 1): STGs equal to LTGs set at supervision level overall.  Skilled Therapeutic Interventions/Progress Updates:    Pt greeted in bathroom with nurse tech present and agreeable to OT treatment session. Pt ambulated out of bathroom w/ RW and supervision. Stopped at the sink to wash hands with verbal cues for RW positioning at the sink. Pt took rest break seated EOB, then agreeable to shower. Pt ambulated back into bathroom in similar fashion and sat on tub bench to doff clothing. Bathing completed with overall supervision and figure 4 position to wash LEs. Dressing completed seated EOB in simulated home environment. OT educated on ways to don TED hose using friction reducing bag with pt demonstrating understanding. Pt ambulated to the wc and was left seated at the sink brushing teeth and hair. Chair alarm on and nursing in room to administer medications.   Therapy Documentation Precautions:  Precautions Precautions: Fall Precaution Comments: hx of falls at home (~8 in last 6 months) Restrictions Weight Bearing Restrictions: No Pain: Denies pain  Therapy/Group: Individual Therapy  Valma Cava 09/26/2018, 8:15 AM

## 2018-09-26 NOTE — Progress Notes (Signed)
[facility] PHYSICAL MEDICINE & REHABILITATION PROGRESS NOTE   Subjective/Complaints:  Up in BR. No new complaints.   ROS: Patient denies fever, rash, sore throat, blurred vision, nausea, vomiting, diarrhea, cough, shortness of breath or chest pain, joint or back pain, headache, or mood change.    Objective:   No results found. No results for input(s): WBC, HGB, HCT, PLT in the last 72 hours. Recent Labs    09/25/18 0704 09/26/18 0630  NA 140 141  K 3.4* 3.8  CL 98 103  CO2 31 29  GLUCOSE 118* 100*  BUN 27* 27*  CREATININE 1.71* 1.91*  CALCIUM 9.1 9.1    Intake/Output Summary (Last 24 hours) at 09/26/2018 1003 Last data filed at 09/26/2018 0738 Gross per 24 hour  Intake 760 ml  Output -  Net 760 ml     Physical Exam: Vital Signs Blood pressure (!) 136/56, pulse 61, temperature 98 F (36.7 C), temperature source Oral, resp. rate 16, height 5\' 2"  (1.575 m), weight 104 kg, SpO2 98 %.  Constitutional: No distress . Vital signs reviewed. HEENT: EOMI, oral membranes moist Neck: supple Cardiovascular: RRR without murmur. No JVD    Respiratory: CTA Bilaterally without wheezes or rales. Normal effort    GI: BS +, non-tender, non-distended    Extremities: No clubbing, cyanosis, or edema Skin: No evidence of breakdown, no evidence of rash Neurologic: Cranial nerves II through XII intact, motor strength is 4/5 in bilateral deltoid, bicep, tricep, grip, hip flexor, knee extensors, ankle dorsiflexor and plantar flexor. Improving standing balance Musculoskeletal: Full range of motion in all 4 extremities. No joint swelling Psych: pleasant   Assessment/Plan: 1. Functional deficits secondary to Debility  which require 3+ hours per day of interdisciplinary therapy in a comprehensive inpatient rehab setting.  Physiatrist is providing close team supervision and 24 hour management of active medical problems listed below.  Physiatrist and rehab team continue to assess barriers to  discharge/monitor patient progress toward functional and medical goals  Care Tool:  Bathing    Body parts bathed by patient: Right arm, Left arm, Chest, Abdomen, Front perineal area, Buttocks, Right upper leg, Left upper leg, Face   Body parts bathed by helper: Right lower leg, Left lower leg     Bathing assist Assist Level: Contact Guard/Touching assist     Upper Body Dressing/Undressing Upper body dressing   What is the patient wearing?: Pull over shirt, Bra    Upper body assist Assist Level: Independent    Lower Body Dressing/Undressing Lower body dressing      What is the patient wearing?: Pants, Underwear/pull up     Lower body assist Assist for lower body dressing: Supervision/Verbal cueing     Toileting Toileting    Toileting assist Assist for toileting: Supervision/Verbal cueing     Transfers Chair/bed transfer  Transfers assist     Chair/bed transfer assist level: Supervision/Verbal cueing     Locomotion Ambulation   Ambulation assist      Assist level: Contact Guard/Touching assist Assistive device: Walker-rolling Max distance: 140'   Walk 10 feet activity   Assist     Assist level: Supervision/Verbal cueing Assistive device: Walker-rolling   Walk 50 feet activity   Assist Walk 50 feet with 2 turns activity did not occur: Safety/medical concerns(low BP/endurance)  Assist level: Contact Guard/Touching assist Assistive device: Walker-rolling    Walk 150 feet activity   Assist Walk 150 feet activity did not occur: Safety/medical concerns(low BP/endurance)  Walk 10 feet on uneven surface  activity   Assist Walk 10 feet on uneven surfaces activity did not occur: Safety/medical concerns(low BP/endurance)         Wheelchair     Assist Will patient use wheelchair at discharge?: Yes(dependent for transport; ambulatory in the home) Type of Wheelchair: Manual    Wheelchair assist level: Contact Guard/Touching  assist Max wheelchair distance: 19'    Wheelchair 50 feet with 2 turns activity    Assist        Assist Level: Contact Guard/Touching assist   Wheelchair 150 feet activity     Assist  Wheelchair 150 feet activity did not occur: Safety/medical concerns(endurance)       Blood pressure (!) 136/56, pulse 61, temperature 98 F (36.7 C), temperature source Oral, resp. rate 16, height 5\' 2"  (1.575 m), weight 104 kg, SpO2 98 %.  Medical Problem List and Plan: 1.Decreased functional mobilitysecondary to subacute to chronic right frontal lobe infarction/acute respiratory failure with sepsis lobar pneumonia as well as hypotension with bradycardia --Continue CIR therapies including PT, OT ---team conf 2. Antithrombotics: -DVT/anticoagulation:Eliquis -antiplatelet therapy: Continue Plavix 75 mg daily 3. Pain Management:Neurontin 300 mg daily 4. Mood:Cymbalta 30 mg daily, Wellbutrin 150 mg daily, Xanax 0.5 mg twice daily as needed -antipsychotic agents: N/A 5. Neuropsych: This patientiscapable of making decisions on herown behalf. 6. Skin/Wound Care:Routine skin checks 7. Fluids/Electrolytes/Nutrition: encourage PO  -continue to replete K+ 8. Hypotension/bradycardia related to sepsis. WBC improved 12,800. Dopamine discontinued. Blood cultures no growth to date. Low-dose Aldactone 12.5 mg daily, Toprol-XL 50 mg daily. Daily monitoring of BP's at rest and with activity BP's better. Dc orthostatic bp's 9. Acute on chronic diastolic congestive heart failure. Demadex 20 mg daily.   Appreciate Follow-up heart failure team -weights up sl Filed Weights   09/24/18 0434 09/25/18 0303 09/26/18 0417  Weight: 103.6 kg 104.2 kg 104 kg    10. AKI/CKD stage III. Renal baseline 1.5-1.8. CR 8/24 1.7 11. PAF. Continue Eliquis. Cardiac rate controlled at present 12. CAD with CABG continue Plavix. 13. OSA/asthma.  Intolerant of CPAP. 14. Diabetes mellitus. Hemoglobin A1c 7.0. Lantus insulin still at 38 units nightly. Check blood sugars before meals and at bedtime.  CBG (last 3)  Recent Labs    09/25/18 1639 09/25/18 2106 09/26/18 0627  GLUCAP 128* 151* 81  poor control:increased lantus to 42U with improvement  -continue to monitor 15. ID/MRSA PCR screening positive/pneumonia. Levaquin completed. 16hyperlipidemia. Crestor    LOS: 5 days A FACE TO FACE EVALUATION WAS PERFORMED  [name] 09/26/2018, 10:03 AM

## 2018-09-26 NOTE — Progress Notes (Signed)
Physical Therapy Discharge Summary  Patient Details  Name: [name] [name] [name]: [id number] Date of Birth: 1945/05/12  Today's Date: 09/27/2018   Patient has met 7 of 7 long term goals due to improved activity tolerance, improved balance, decreased pain and ability to compensate for deficits.  Patient to discharge at an ambulatory level Supervision with RW.   Patient's care partner is independent to provide the necessary supervision assistance at discharge.  Reasons goals not met: Patient has met all rehab goals.  Recommendation:  Patient will benefit from ongoing skilled PT services in home health setting to continue to advance safe functional mobility, address ongoing impairments in endurance, strength, balance, and minimize fall risk.  Equipment: No equipment provided. Pt already owns RW and w/c  Reasons for discharge: treatment goals met and discharge from hospital  Patient/family agrees with progress made and goals achieved: Yes  PT Discharge Precautions/Restrictions Precautions Precautions: Fall Required Braces or Orthoses: (has L ankle brace at home) Restrictions Weight Bearing Restrictions: No Vital Signs Therapy Vitals Temp: 98 F (36.7 C) Temp Source: Oral Pulse Rate: (!) 52 Resp: 18 BP: (!) 150/60 Patient Position (if appropriate): Sitting Oxygen Therapy SpO2: 100 % O2 Device: Room Air Pain Pain Assessment Pain Scale: 0-10 Pain Score: 0-No pain Vision/Perception  Perception Perception: Within Functional Limits Praxis Praxis: Intact  Cognition Overall Cognitive Status: Within Functional Limits for tasks assessed Orientation Level: Oriented X4 Safety/Judgment: Appears intact Sensation Sensation Light Touch: Impaired Detail Peripheral sensation comments: decreased sensation in L foot/ankle Light Touch Impaired Details: Impaired LLE Proprioception: Appears Intact Coordination Gross Motor Movements are Fluid and Coordinated: Yes Motor  Motor Motor:  Other (comment) Motor - Discharge Observations: generalized deconditioning  Mobility Transfers Transfers: Stand to Sit;Sit to Stand;Stand Pivot Transfers Sit to Stand: Independent with assistive device Stand to Sit: Independent with assistive device Stand Pivot Transfers: Independent with assistive device Locomotion  Gait Ambulation: Yes Gait Assistance: Supervision/Verbal cueing Gait Distance (Feet): 140 Feet Assistive device: Rolling walker Gait Assistance Details: Recommending supervision with mobility due to h/o multiple falls; at times requires cues for self pacing/monitoring with upright mobility due to decreased endurance Gait Gait: Yes Stairs / Additional Locomotion Stairs: Yes Stairs Assistance: Supervision/Verbal cueing Stair Management Technique: With walker;Two rails;Step to pattern Number of Stairs: 4(4 with rails; 1 with RW) Height of Stairs: 6 Curb: Supervision/Verbal cueing Wheelchair Mobility Wheelchair Mobility: No  Trunk/Postural Assessment  Cervical Assessment Cervical Assessment: Exceptions to [facility] Thoracic Assessment Thoracic Assessment: (mild kyphosis) Lumbar Assessment Lumbar Assessment: (posterior pelvic tilt; body habitus limiting) Postural Control Postural Control: Within Functional Limits  Balance Balance Balance Assessed: Yes Static Sitting Balance Static Sitting - Level of Assistance: 7: Independent Dynamic Sitting Balance Dynamic Sitting - Level of Assistance: 7: Independent Static Standing Balance Static Standing - Level of Assistance: 6: Modified independent (Device/Increase time) Dynamic Standing Balance Dynamic Standing - Level of Assistance: 5: Stand by assistance;6: Modified independent (Device/Increase time) Extremity Assessment      RLE Assessment RLE Assessment: Within Functional Limits General Strength Comments: decreased muscular endurance LLE Assessment LLE Assessment: Exceptions to [facility] General Strength Comments: muscle  atrophy in L ankle due to ankle sx in last few months    Juanna Cao, PT, DPT, CBIS  09/26/2018, 2:26 PM

## 2018-09-26 NOTE — Progress Notes (Signed)
Physical Therapy Session Note  Patient Details  Name: [name] [name] [name]: [id number] Date of Birth: 1945-09-26  Today's Date: 09/26/2018 PT Individual Time: 1057-1157 PT Individual Time Calculation (min): 60 min   Short Term Goals: Week 1:  PT Short Term Goal 1 (Week 1): = LTGs due to LOS  Skilled Therapeutic Interventions/Progress Updates:    Functional gait in and out of bathroom with Supervision with RW to perform toileting tasks and hand hygiene at sink. Gait training on unit x 140' x 50' x 2 with supervision with RW (cues for upright posture and decreased reliance on UEs) with seated rest breaks and less cues needed for self awareness to fatigue level. Basic transfers progressing to mod I with RW. NMR for balance retraining on foam wedge while performing functional reaching task and static balance retraining without UE support with CGA x 2 trials. Seated rest breaks in between. Stair negotiation for curb step with RW for home entry practice with supervision x 2 trials with good recall. Discussed home set-up, use of shoes and ankle brace in home for safety due to ankle issue, and educated on energy conservation techniques. Pt denies concerns and eager about upcoming d/c date.   Therapy Documentation Precautions:  Precautions Precautions: Fall Precaution Comments: hx of falls at home (~8 in last 6 months) Restrictions Weight Bearing Restrictions: No  Pain: Reports pain in L ankle. Applied ACE wrap as pt reports she forgot she had a brace from ankle sx a few months ago that she should be wearing. Plans to have husband bring it in later today.     Therapy/Group: Individual Therapy  Canary Brim Ivory Broad, PT, DPT, CBIS  09/26/2018, 12:13 PM

## 2018-09-26 NOTE — Discharge Summary (Signed)
Physician Discharge Summary  Patient ID: [name] [name] [name]: [id number] DOB/AGE: [birth year] 73 y.o.  Admit date: 09/21/2018 Discharge date: 09/28/2018  Discharge Diagnoses:  Principal Problem:   Debility Active Problems:   Chronic systolic CHF (congestive heart failure) (HCC) DVT prophylaxis Mood stabilization Hypotension with bradycardia AKI with CKD stage III PAF CAD with CABG OSA with history of asthma Diabetes mellitus with peripheral neuropathy Hyperlipidemia MRSA PCR screening positive/pneumonia  Discharged Condition: Stable  Significant Diagnostic Studies: Dg Ribs Unilateral Left  Result Date: 09/09/2018 CLINICAL DATA:  Recent falls.  Left leg and rib pain. EXAM: LEFT RIBS - 2 VIEW COMPARISON:  Chest radiographs today.  Chest CT 04/03/2018. FINDINGS: Two views demonstrate no evidence of acute left-sided rib fracture or focal rib lesion. As noted on earlier radiographs, there is probable pulmonary edema with stable cardiomegaly. No significant pleural effusion or pneumothorax. There are postsurgical changes in the left upper quadrant of the abdomen. IMPRESSION: No evidence of left-sided rib fracture, pleural effusion or pneumothorax. Electronically Signed   By: Richardean Sale M.D.   On: 09/09/2018 18:20   Ct Head Wo Contrast  Result Date: 09/18/2018 CLINICAL DATA:  Focal neural deficit of less than 6 hours, suspected stroke, slurred speech, headache EXAM: CT HEAD WITHOUT CONTRAST TECHNIQUE: Contiguous axial images were obtained from the base of the skull through the vertex without intravenous contrast. Sagittal and coronal MPR images reconstructed from axial data set. COMPARISON:  09/09/2018 FINDINGS: Brain: Generalized atrophy. Normal ventricular morphology. No midline shift or mass effect. Small vessel chronic ischemic changes of deep cerebral white matter. Old RIGHT occipital infarct. Old white matter infarcts in RIGHT centrum semiovale. No intracranial hemorrhage, mass lesion,  or evidence of acute infarction. No extra-axial fluid collections. No intracranial hemorrhage, mass lesion, evidence of acute infarction, or extra-axial fluid collection. Vascular: Atherosclerotic calcifications of internal carotid arteries at skull base Skull: Intact.  Mild hyperostosis frontalis interna Sinuses/Orbits: Clear Other: N/A IMPRESSION: Atrophy with small vessel chronic ischemic changes of deep cerebral white matter. Old RIGHT occipital and RIGHT centrum semiovale infarcts. No acute intracranial abnormalities. Electronically Signed   By: Lavonia Dana M.D.   On: 09/18/2018 08:22   Ct Head Wo Contrast  Result Date: 09/09/2018 CLINICAL DATA:  Head trauma. High clinical risk. Slurred speech. EXAM: CT HEAD WITHOUT CONTRAST TECHNIQUE: Contiguous axial images were obtained from the base of the skull through the vertex without intravenous contrast. COMPARISON:  March 29, 2018 FINDINGS: Brain: No evidence of acute hemorrhage, hydrocephalus, extra-axial collection or mass lesion/mass effect. Chronic right occipital lobe infarct. New areas of hypoattenuation in the subcortical white matter of the right frontal lobe may represent small age-indeterminate ischemic infarcts. Vascular: Calcific atherosclerotic disease of the intra cavernous carotid arteries. Skull: Normal. Negative for fracture or focal lesion. Sinuses/Orbits: Minimal opacification of the left mastoid air cells. Paranasal sinuses are clear. Other: None. IMPRESSION: 1. New areas of hypoattenuation in the subcortical white matter of the right frontal lobe may represent small age-indeterminate ischemic infarcts. 2. Chronic right occipital lobe infarct. 3. No evidence of traumatic injury to the brain. Electronically Signed   By: Fidela Salisbury M.D.   On: 09/09/2018 17:29   Mr Angio Head Wo Contrast  Result Date: 09/10/2018 CLINICAL DATA:  Weakness and dizziness for the past several days. Mild slurred speech developed today. EXAM: MRI HEAD  WITHOUT CONTRAST MRA HEAD WITHOUT CONTRAST TECHNIQUE: Multiplanar, multiecho pulse sequences of the brain and surrounding structures were obtained without intravenous contrast. Angiographic images of the head  were obtained using MRA technique without contrast. COMPARISON:  Brain MRI 10/07/2013 and MRA head 06/01/2014 FINDINGS: MRI HEAD FINDINGS BRAIN: There is no acute infarct, acute hemorrhage or extra-axial collection. There is an old right PCA territory infarct. Multiple areas of rounded hyperintense T2-weighted signal are present within the white matter of both hemispheres. The cerebral and cerebellar volume are age-appropriate. There is no hydrocephalus. The midline structures are normal. VASCULAR: There are 3-5 scattered foci of chronic microhemorrhage. The major intracranial arterial and venous sinus flow voids are normal. SKULL AND UPPER CERVICAL SPINE: Calvarial bone marrow signal is normal. There is no skull base mass. The visualized upper cervical spine and soft tissues are normal. SINUSES/ORBITS: There are no fluid levels or advanced mucosal thickening. The mastoid air cells and middle ear cavities are free of fluid. The orbits are normal. MRA HEAD FINDINGS POSTERIOR CIRCULATION: --Vertebral arteries: Normal V4 segments. --Posterior inferior cerebellar arteries (PICA): Patent origins from the vertebral arteries. --Anterior inferior cerebellar arteries (AICA): Not clearly visualized, though this is not uncommon. --Basilar artery: Proximal basilar artery is severely narrowed, but remains patent. --Superior cerebellar arteries: Normal. --Posterior cerebral arteries (PCA): The right PCA arises from the basilar artery with a diminutive right posterior communicating artery (P-comm). There is no right PCA occlusion or proximal stenosis. The left PCA originates combination from the basilar artery and AE small left P-comm. There is severe stenosis of the P1 and P2 segments. ANTERIOR CIRCULATION: --Intracranial  internal carotid arteries: There is multifocal severe stenosis of both internal carotid arteries at the skull base. --Anterior cerebral arteries (ACA): Limited enhancement of the left A1 segment. Otherwise normal. --Middle cerebral arteries (MCA): There is mild narrowing within some of the distal right MCA M2 branches, but the right MCA is otherwise normal. There is loss of flow related enhancement at the left M1 segment proximally. The distal M1 segment enhances normally. The left M2 branches are patent. IMPRESSION: 1. No acute ischemic infarct or intracranial hemorrhage. 2. Multiple rounded areas of peripheral hyperintense T2-weighted signal within the bilateral supratentorial white matter, corresponding to the areas of hypoattenuation on the earlier head CT. These likely indicate late subacute to early chronic infarcts, not present on prior MRIs. No diffusion restriction to indicate acute ischemia. 3. Severe intracranial atherosclerosis with short segment occlusion of the proximal left middle cerebral artery with normal enhancement of the distal left M1 segment and the M2 branches, new compared to 06/01/2014. 4. Severe stenosis of the left PCA P1 and P2 segments, unchanged compared to 06/01/2014. 5. Severe stenoses of both internal carotid arteries at the skull base, progressed compared to 06/01/2014. Electronically Signed   By: Ulyses Jarred M.D.   On: 09/10/2018 02:19   Mr Brain Wo Contrast  Result Date: 09/10/2018 CLINICAL DATA:  Weakness and dizziness for the past several days. Mild slurred speech developed today. EXAM: MRI HEAD WITHOUT CONTRAST MRA HEAD WITHOUT CONTRAST TECHNIQUE: Multiplanar, multiecho pulse sequences of the brain and surrounding structures were obtained without intravenous contrast. Angiographic images of the head were obtained using MRA technique without contrast. COMPARISON:  Brain MRI 10/07/2013 and MRA head 06/01/2014 FINDINGS: MRI HEAD FINDINGS BRAIN: There is no acute infarct,  acute hemorrhage or extra-axial collection. There is an old right PCA territory infarct. Multiple areas of rounded hyperintense T2-weighted signal are present within the white matter of both hemispheres. The cerebral and cerebellar volume are age-appropriate. There is no hydrocephalus. The midline structures are normal. VASCULAR: There are 3-5 scattered foci of chronic microhemorrhage. The  major intracranial arterial and venous sinus flow voids are normal. SKULL AND UPPER CERVICAL SPINE: Calvarial bone marrow signal is normal. There is no skull base mass. The visualized upper cervical spine and soft tissues are normal. SINUSES/ORBITS: There are no fluid levels or advanced mucosal thickening. The mastoid air cells and middle ear cavities are free of fluid. The orbits are normal. MRA HEAD FINDINGS POSTERIOR CIRCULATION: --Vertebral arteries: Normal V4 segments. --Posterior inferior cerebellar arteries (PICA): Patent origins from the vertebral arteries. --Anterior inferior cerebellar arteries (AICA): Not clearly visualized, though this is not uncommon. --Basilar artery: Proximal basilar artery is severely narrowed, but remains patent. --Superior cerebellar arteries: Normal. --Posterior cerebral arteries (PCA): The right PCA arises from the basilar artery with a diminutive right posterior communicating artery (P-comm). There is no right PCA occlusion or proximal stenosis. The left PCA originates combination from the basilar artery and AE small left P-comm. There is severe stenosis of the P1 and P2 segments. ANTERIOR CIRCULATION: --Intracranial internal carotid arteries: There is multifocal severe stenosis of both internal carotid arteries at the skull base. --Anterior cerebral arteries (ACA): Limited enhancement of the left A1 segment. Otherwise normal. --Middle cerebral arteries (MCA): There is mild narrowing within some of the distal right MCA M2 branches, but the right MCA is otherwise normal. There is loss of flow  related enhancement at the left M1 segment proximally. The distal M1 segment enhances normally. The left M2 branches are patent. IMPRESSION: 1. No acute ischemic infarct or intracranial hemorrhage. 2. Multiple rounded areas of peripheral hyperintense T2-weighted signal within the bilateral supratentorial white matter, corresponding to the areas of hypoattenuation on the earlier head CT. These likely indicate late subacute to early chronic infarcts, not present on prior MRIs. No diffusion restriction to indicate acute ischemia. 3. Severe intracranial atherosclerosis with short segment occlusion of the proximal left middle cerebral artery with normal enhancement of the distal left M1 segment and the M2 branches, new compared to 06/01/2014. 4. Severe stenosis of the left PCA P1 and P2 segments, unchanged compared to 06/01/2014. 5. Severe stenoses of both internal carotid arteries at the skull base, progressed compared to 06/01/2014. Electronically Signed   By: Ulyses Jarred M.D.   On: 09/10/2018 02:19   US Renal  Result Date: 09/18/2018 CLINICAL DATA:  Initial evaluation for acute renal injury. EXAM: RENAL / URINARY TRACT ULTRASOUND COMPLETE COMPARISON:  None available. FINDINGS: Right Kidney: Renal measurements: 10.6 x 5.1 x 6.3 cm = volume: 178.1 mL . Echogenicity within normal limits. No mass or hydronephrosis visualized. Left Kidney: Renal measurements: 9.5 x 4.8 x 4.3 cm = volume: 100.0 mL. Echogenicity within normal limits. No mass or hydronephrosis visualized. Bladder: Not visualized on this exam. IMPRESSION: Normal renal ultrasound. No hydronephrosis or other significant finding. Electronically Signed   By: Jeannine Boga M.D.   On: 09/18/2018 03:38   Dg Chest Port 1 View  Result Date: 09/18/2018 CLINICAL DATA:  Central line placement EXAM: PORTABLE CHEST 1 VIEW COMPARISON:  09/18/2018 at 0153 hours FINDINGS: Left IJ venous catheter terminates at the cavoatrial junction. Pulmonary vascular  congestion without frank interstitial edema. No pleural effusion or pneumothorax. Cardiomegaly.  Postsurgical changes related to prior CABG. Median sternotomy. IMPRESSION: Left IJ venous catheter terminates at the cavoatrial junction. Electronically Signed   By: Julian Hy M.D.   On: 09/18/2018 18:03   Dg Chest Port 1 View  Result Date: 09/18/2018 CLINICAL DATA:  Initial evaluation for hypotension, shortness of breath. EXAM: PORTABLE CHEST 1 VIEW COMPARISON:  Prior radiograph from 09/10/2018. FINDINGS: Median sternotomy wires with underlying surgical clips and CABG markers noted. Moderate cardiomegaly, stable. Mediastinal silhouette within normal limits. Lungs normally inflated. No focal infiltrates. No pulmonary edema or discernible pleural effusion. No pneumothorax. No acute osseous finding. IMPRESSION: 1. No radiographic evidence for active cardiopulmonary disease. 2. Stable cardiomegaly without pulmonary edema. Electronically Signed   By: Jeannine Boga M.D.   On: 09/18/2018 02:11   Dg Chest Port 1 View  Result Date: 09/10/2018 CLINICAL DATA:  73 year old female with history of dyspnea. EXAM: PORTABLE CHEST 1 VIEW COMPARISON:  Chest x-ray 09/09/2018. FINDINGS: There is cephalization of the pulmonary vasculature and slight indistinctness of the interstitial markings suggestive of mild pulmonary edema. More focal consolidative airspace disease in the right lung base. No definite pleural effusions. Mild cardiomegaly. Upper mediastinal contours are within normal limits. Aortic atherosclerosis. Status post median sternotomy for CABG. IMPRESSION: 1. The appearance the chest suggests congestive heart failure, as above. In addition, there is more focal airspace consolidation in the right lung base which could suggest superimposed developing bronchopneumonia. Further clinical evaluation is suggested. 2. Aortic atherosclerosis. Electronically Signed   By: Vinnie Langton M.D.   On: 09/10/2018 10:31    Dg Chest Port 1 View  Result Date: 09/09/2018 CLINICAL DATA:  Shortness of breath multiple recent falls and slurred speech. History of diabetes and stroke. EXAM: PORTABLE CHEST 1 VIEW COMPARISON:  Radiographs 05/18/2018 and 04/05/2018.  CT 04/03/2018. FINDINGS: 1549 hours. Stable cardiomegaly post median sternotomy and CABG. Pulmonary vascular congestion has slightly worsened, and there is probable mild pulmonary edema. No confluent airspace opacity, pleural effusion or pneumothorax. The bones appear intact. Telemetry leads overlie the chest. IMPRESSION: Cardiomegaly with increased vascular congestion and probable mild pulmonary edema. No confluent airspace opacity or pneumothorax. Electronically Signed   By: Richardean Sale M.D.   On: 09/09/2018 16:31   Dg Femur Min 2 Views Left  Result Date: 09/09/2018 CLINICAL DATA:  Recent falls.  Left thigh pain. EXAM: LEFT FEMUR 2 VIEWS COMPARISON:  Left hip radiographs 07/31/2018. FINDINGS: The mineralization and alignment are normal. There is no evidence of acute fracture or dislocation. Stable mild degenerative changes at the left hip and knee. There are scattered vascular calcifications and vascular clips. No unexpected foreign bodies. IMPRESSION: No evidence of acute fracture or dislocation. Electronically Signed   By: Richardean Sale M.D.   On: 09/09/2018 18:22   Vas US Carotid  Result Date: 09/11/2018 Carotid Arterial Duplex Study Indications:       CVA. Risk Factors:      Hypertension, hyperlipidemia, Diabetes, coronary artery                    disease, prior CVA. Comparison Study:  05/23/17 Performing Technologist: Abram Sander RVS  Examination Guidelines: A complete evaluation includes B-mode imaging, spectral Doppler, color Doppler, and power Doppler as needed of all accessible portions of each vessel. Bilateral testing is considered an integral part of a complete examination. Limited examinations for reoccurring indications may be performed as noted.   Right Carotid Findings: +----------+--------+--------+--------+------------+---------+           PSV cm/sEDV cm/sStenosisDescribe    Comments  +----------+--------+--------+--------+------------+---------+ CCA Prox  34      8               heterogenous          +----------+--------+--------+--------+------------+---------+ CCA Distal51      11  heterogenous          +----------+--------+--------+--------+------------+---------+ ICA Prox  66      25      1-39%   heterogenousShadowing +----------+--------+--------+--------+------------+---------+ ICA Distal59      30                                    +----------+--------+--------+--------+------------+---------+ ECA       466             >50%                          +----------+--------+--------+--------+------------+---------+ +----------+--------+-------+--------+-------------------+           PSV cm/sEDV cmsDescribeArm Pressure (mmHG) +----------+--------+-------+--------+-------------------+ [id number]                                         +----------+--------+-------+--------+-------------------+ +---------+--------+--+--------+-+---------+ VertebralPSV cm/s30EDV cm/s9Antegrade +---------+--------+--+--------+-+---------+  Left Carotid Findings: +----------+--------+--------+--------+------------+--------+           PSV cm/sEDV cm/sStenosisDescribe    Comments +----------+--------+--------+--------+------------+--------+ CCA Prox  70      19              heterogenous         +----------+--------+--------+--------+------------+--------+ CCA Distal52      16              heterogenous         +----------+--------+--------+--------+------------+--------+ ECA       392             >50%                         +----------+--------+--------+--------+------------+--------+ +----------+--------+--------+--------+-------------------+ SubclavianPSV cm/sEDV  cm/sDescribeArm Pressure (mmHG) +----------+--------+--------+--------+-------------------+           85                                          +----------+--------+--------+--------+-------------------+ +---------+--------+--+--------+--+---------+ VertebralPSV cm/s93EDV cm/s25Antegrade +---------+--------+--+--------+--+---------+  Left Stent(s): +---------------+--+--+-------------+++ Prox to Stent  6416              +---------------+--+--+-------------+++ Proximal Stent 7215<50% stenosis +---------------+--+--+-------------+++ Mid Stent      7535              +---------------+--+--+-------------+++ Distal Stent   7837              +---------------+--+--+-------------+++ Distal to [id number]              +---------------+--+--+-------------+++  Summary: Right Carotid: Velocities in the right ICA are consistent with a 1-39% stenosis.                The ECA appears >50% stenosed. Left Carotid: ICA stent <50% stenosis. The ECA appears >50% stenosed. Vertebrals: Bilateral vertebral arteries demonstrate antegrade flow. *See table(s) above for measurements and observations.  Electronically signed by Harold Barban MD on 09/11/2018 at 10:07:51 AM.    Final     Labs:  Basic Metabolic Panel: Recent Labs  Lab 09/22/18 0742 09/23/18 0458 09/24/18 1010 09/25/18 0704 09/26/18 0630  NA 139 140 137 140 141  K 2.7* 3.0* 3.3* 3.4* 3.8  CL 98 97* 93* 98 103  CO2 29 30 31 31 29   GLUCOSE  146* 140* 198* 118* 100*  BUN 25* 34* 32* 27* 27*  CREATININE 1.60* 2.28* 2.07* 1.71* 1.91*  CALCIUM 9.2 8.8* 9.1 9.1 9.1    CBC: Recent Labs  Lab 09/22/18 0742  WBC 11.2*  NEUTROABS 7.5  HGB 12.4  HCT 39.2  MCV 87.1  PLT 279    CBG: Recent Labs  Lab 09/26/18 2100 09/27/18 0609 09/27/18 1135 09/27/18 1646 09/27/18 2100  GLUCAP 216* 133* 131* 154* 176*  Family history.  Father with CAD, diabetes mellitus, hypertension, deep vein thrombosis, abdominal aortic  aneurysm.  Mother with history of DVT.  Sister with ovarian cancer.  Paternal grandmother with diabetes as well as CAD.  Negative for colon cancer  Brief HPI:   [name] [name] is a 73 y.o. right-handed female with history of hypertension, chronic back pain maintained on oxycodone in the past, diabetes mellitus, hyperlipidemia, asthma, chronic diastolic congestive heart failure with ejection fraction 25%, CVA, CAD with CABG, atrial fibrillation maintained on Eliquis and Plavix followed by cardiology services.  Lives with spouse using rolling walker for mobility reports of multiple falls.  Husband and daughter assist as needed.  Patient had been at a skilled facility to end of July for bouts of pneumonia.  Presented 09/09/2018 with transient slurred speech dizziness vomiting as well as falls x3 without loss of consciousness.  Noted bruises to left upper back and left leg.  WBC 14,500, creatinine 1.13, lactic acid 1.2, COVID negative, urinalysis negative, potassium 3.3, low-grade fever 100.9.  Chest x-ray showed cardiomegaly vascular congestion mild pulmonary edema as well as developing bronchial pneumonia right lower lobe.  MRSA PCR screening positive.  X-rays of left femur left rib negative for fracture.  CT of the head as well as MRI and MRA showed no acute findings.  Severe stenosis left PCA P1 and P2 segments unchanged from compared to 06/01/2014.  Severe stenosis of both internal carotid arteries at the skull base progressed compared to 06/01/2014.  Neurology follow-up to address MRI suspect subacute to chronic right frontal lobe infarction new from prior scan of February 2020.  It was advised to continue Eliquis and Plavix.  Patient maintained on vancomycin and cefepime for fevers leukocytosis improved changed to Levaquin 812 20203 doses.  She did receive low-dose diuresis monitoring for signs of hypoxia with noted CO2 up to 42.  Tolerating a regular diet.  She was admitted to inpatient rehab services 09/14/2018  with slow but progressive gains.  On 8/16-8/17/2020 patient noted to be hypotensive with systolics in the 40N and diastolics in the 02V.  Cardiology had been consulted 09/16/2018 Demadex was held placed on IV fluids noted elevation in creatinine from 1.68-3.14.  Initially remained on Norvasc decrease to 5 mg as well as losartan and Toprol.  A follow-up echocardiogram showed ejection fraction 50% mildly reduced systolic function.  Patient received a total of 1 L IV fluids continue to be hypotensive in addition to bradycardia heart rate 30s to 40s as well as leukocytosis she was discharged to acute care services for ongoing monitoring.  Placed on a dopamine drip initially hypotensive suspect secondary to volume depletion from overdiuresis and diuretics remained on hold.  Cranial CT scan completed due to some questionable increasing slurred speech that was unremarkable.  Heart failure team continue to follow with medication changes as directed low-dose Demadex later initiated.  Creatinine rebounded nicely 1.83 and monitoring of hypokalemia.  She remained on Eliquis and Plavix for atrial fibrillation with plan documented by cardiology services the Plavix could  be discontinued 10/22/2018.  Patient was readmitted to resume inpatient rehab services   Hospital Course: [name] [name] was admitted to rehab 09/21/2018 for inpatient therapies to consist of PT, ST and OT at least three hours five days a week. Past admission physiatrist, therapy team and rehab RN have worked together to provide customized collaborative inpatient rehab.  Pertaining to patient's debility related to subacute to chronic right frontal lobe infarction acute respiratory failure complicated by sepsis lobar pneumonia with hypotension and bradycardia.  She continued to participate with therapy she remained on chronic Eliquis and Plavix.  Pain management use of Neurontin.  Mood stabilization with Cymbalta as well as Wellbutrin emotional support provided.   In regards to her hypotension bradycardia dopamine had since been discontinued while on acute care.  Blood cultures no growth to date.  Low-dose Aldactone Toprol as directed as well as Demadex.  Patient received followed by heart failure team.  AKI with CKD stage III creatinine improved 1.7 and monitored from a baseline 1.5-1.8.  Blood sugars overall controlled hemoglobin A1c of 7 insulin therapy as directed.  Crestor ongoing for hyperlipidemia.   Blood pressures were monitored on TID basis and monitored with therapies  Diabetes has been monitored with ac/hs CBG checks and SSI was use prn for tighter BS control.   She is continent of bowel and bladder.  She has made gains during rehab stay and is attending full therapies  She will continue to receive follow up therapies   after discharge  Rehab course: During patient's stay in rehab weekly team conferences were held to monitor patient's progress, set goals and discuss barriers to discharge. At admission, patient required minimal assist stand pivot transfers, minimal assist sit to supine.  Minimal assist upper body bathing minimal assist lower body bathing minimal assist upper body dressing minimal assist lower body dressing minimal assist toilet transfers  Physical exam.  Blood pressure 180/66 pulse 95 temperature 97.5 respirations 18 oxygen saturations 100% room air Constitutional.  No distress HEENT Head.  Normocephalic and atraumatic Eyes pupils round and reactive to light EOMs normal no nystagmus Neck supple nontender no JVD no thyromegaly present Cardiovascular normal rate regular rhythm normal heart sounds Respiratory effort normal breath sounds normal no respiratory distress without wheezes or rails GI.  Soft exhibits no distention nontender positive bowel sounds Musculoskeletal normal range of motion no edema Neurological patient alert makes good eye contact follows commands fair awareness of deficits moving all extremities. Skin.   A few scattered abrasions skin tears along lower extremities  She  has had improvement in activity tolerance, balance, postural control as well as ability to compensate for deficits. [name] [name] has had improvement in functional use RUE/LUE  and RLE/LLE as well as improvement in awareness.  Functional gait in and out of the bathroom with supervision with rolling walker to perform toileting task and hand hygiene at the sink.  Ambulates 140 feet supervision rolling walker working with energy conservation techniques.  Basic transfers progressing to modified independent with rolling walker.  Stair negotiation rolling walker supervision.  She can gather her belongings for activities delivered and homemaking full family teaching completed plan discharge to home       Disposition: Discharge disposition: 01-Home or Self Care     Discharge to home   Diet: Carb moderate  Special Instructions: No driving smoking or alcohol   Medications at discharge 1.  Tylenol as needed 2.  Xanax 0.5 mg p.o. twice daily as needed anxiety 3.  Eliquis 5  mg p.o. twice daily 4.  Wellbutrin 150 mg p.o. daily 5.  Plavix 75 mg p.o. daily until 10/22/2018 and stop as per recommendations of cardiology services 6.  Mucinex 1 tablet p.o. twice daily 7.  Cymbalta 30 mg p.o. daily 8.  Neurontin 300 mg p.o. daily 9.  Lantus insulin 42 units nightly 10.  Toprol-XL 50 mg p.o. daily 11.  Multivitamin daily 12.  K. Dur 20 mg p.o. twice daily 13.  Ranexa 500 mg p.o. twice daily 14.  Crestor 40 mg p.o. daily 15.  Aldactone 25 mg p.o. daily 16.  Demadex 20 mg p.o. daily  Discharge Instructions    Ambulatory referral to Physical Medicine Rehab   Complete by: As directed    Moderate complexity follow up 1-2 weeks chronic right frontal lobe infarction      Follow-up Information    Kirsteins, Luanna Salk, MD Follow up.   Specialty: Physical Medicine and Rehabilitation Why: Office to call for appointment Contact  information: [place] [address] [phone]        [facility] Follow up on 10/28/2018.   Specialty: Cardiology Contact information: [id number] [place] [phone]          Signed: Lavon Paganini [place] 09/28/2018, 5:23 AM

## 2018-09-27 ENCOUNTER — Inpatient Hospital Stay (HOSPITAL_COMMUNITY): Payer: Medicare Other

## 2018-09-27 ENCOUNTER — Inpatient Hospital Stay (HOSPITAL_COMMUNITY): Payer: Medicare Other | Admitting: Physical Therapy

## 2018-09-27 ENCOUNTER — Inpatient Hospital Stay (HOSPITAL_COMMUNITY): Payer: Medicare Other | Admitting: Occupational Therapy

## 2018-09-27 LAB — GLUCOSE, CAPILLARY
Glucose-Capillary: 133 mg/dL — ABNORMAL HIGH (ref 70–99)
Glucose-Capillary: 131 mg/dL — ABNORMAL HIGH (ref 70–99)
Glucose-Capillary: 154 mg/dL — ABNORMAL HIGH (ref 70–99)
Glucose-Capillary: 176 mg/dL — ABNORMAL HIGH (ref 70–99)

## 2018-09-27 NOTE — Progress Notes (Signed)
Social Work Patient ID: [name] [name], female   DOB: [birth year], 73 y.o.   [name]: [id number]   Met yesterday with pt to review team conference.  She is aware and agreeable with targeted d/c date of 8/27 and supervision goals.  Very pleased with progress!  Reviewed [place] f/u being arranged.    Emitt Maglione, LCSW

## 2018-09-27 NOTE — Progress Notes (Signed)
[facility] PHYSICAL MEDICINE & REHABILITATION PROGRESS NOTE   Subjective/Complaints:  Up going to bathroom. No new issues this morning.   ROS: Patient denies fever, rash, sore throat, blurred vision, nausea, vomiting, diarrhea, cough, shortness of breath or chest pain, joint or back pain, headache, or mood change. .    Objective:   No results found. No results for input(s): WBC, HGB, HCT, PLT in the last 72 hours. Recent Labs    09/25/18 0704 09/26/18 0630  NA 140 141  K 3.4* 3.8  CL 98 103  CO2 31 29  GLUCOSE 118* 100*  BUN 27* 27*  CREATININE 1.71* 1.91*  CALCIUM 9.1 9.1    Intake/Output Summary (Last 24 hours) at 09/27/2018 1224 Last data filed at 09/27/2018 0804 Gross per 24 hour  Intake 720 ml  Output -  Net 720 ml     Physical Exam: Vital Signs Blood pressure (!) 145/69, pulse 71, temperature 98 F (36.7 C), temperature source Oral, resp. rate 18, height 5\' 2"  (1.575 m), weight 104.1 kg, SpO2 94 %.  Constitutional: No distress . Vital signs reviewed. obese HEENT: EOMI, oral membranes moist Neck: supple Cardiovascular: RRR without murmur. No JVD    Respiratory: CTA Bilaterally without wheezes or rales. Normal effort    GI: BS +, non-tender, non-distended  Extremities: No clubbing, cyanosis, or edema Skin: No evidence of breakdown, no evidence of rash Neurologic: Cranial nerves II through XII intact, motor strength is 4/5 in bilateral deltoid, bicep, tricep, grip, hip flexor, knee extensors, ankle dorsiflexor and plantar flexor. Good standing balance with walker.  Musculoskeletal: Full range of motion in all 4 extremities. No joint swelling Psych: pleasant   Assessment/Plan: 1. Functional deficits secondary to Debility  which require 3+ hours per day of interdisciplinary therapy in a comprehensive inpatient rehab setting.  Physiatrist is providing close team supervision and 24 hour management of active medical problems listed below.  Physiatrist and rehab  team continue to assess barriers to discharge/monitor patient progress toward functional and medical goals  Care Tool:  Bathing    Body parts bathed by patient: Right arm, Left arm, Chest, Abdomen, Front perineal area, Buttocks, Right upper leg, Left upper leg, Face, Right lower leg, Left lower leg   Body parts bathed by helper: Right lower leg, Left lower leg     Bathing assist Assist Level: Supervision/Verbal cueing     Upper Body Dressing/Undressing Upper body dressing   What is the patient wearing?: Pull over shirt, Bra    Upper body assist Assist Level: Independent    Lower Body Dressing/Undressing Lower body dressing      What is the patient wearing?: Pants, Underwear/pull up     Lower body assist Assist for lower body dressing: Supervision/Verbal cueing     Toileting Toileting    Toileting assist Assist for toileting: Supervision/Verbal cueing     Transfers Chair/bed transfer  Transfers assist     Chair/bed transfer assist level: Independent with assistive device Chair/bed transfer assistive device: Programmer, multimedia   Ambulation assist      Assist level: Supervision/Verbal cueing Assistive device: Walker-rolling Max distance: 140'   Walk 10 feet activity   Assist     Assist level: Supervision/Verbal cueing Assistive device: Walker-rolling   Walk 50 feet activity   Assist Walk 50 feet with 2 turns activity did not occur: Safety/medical concerns(low BP/endurance)  Assist level: Supervision/Verbal cueing Assistive device: Walker-rolling    Walk 150 feet activity   Assist Walk 150  feet activity did not occur: Safety/medical concerns(low BP/endurance)         Walk 10 feet on uneven surface  activity   Assist Walk 10 feet on uneven surfaces activity did not occur: Safety/medical concerns(low BP/endurance)         Wheelchair     Assist Will patient use wheelchair at discharge?: Yes(dependent for  transport; ambulatory in the home) Type of Wheelchair: Manual    Wheelchair assist level: Contact Guard/Touching assist Max wheelchair distance: 8'    Wheelchair 50 feet with 2 turns activity    Assist        Assist Level: Contact Guard/Touching assist   Wheelchair 150 feet activity     Assist  Wheelchair 150 feet activity did not occur: Safety/medical concerns(endurance)       Blood pressure (!) 145/69, pulse 71, temperature 98 F (36.7 C), temperature source Oral, resp. rate 18, height 5\' 2"  (1.575 m), weight 104.1 kg, SpO2 94 %.  Medical Problem List and Plan: 1.Decreased functional mobilitysecondary to subacute to chronic right frontal lobe infarction/acute respiratory failure with sepsis lobar pneumonia as well as hypotension with bradycardia --Continue CIR therapies including PT, OT   -DC tomorrow  Patient to see Rehab MD/provider in the office for transitional care encounter in 1-2 weeks.  2. Antithrombotics: -DVT/anticoagulation:Eliquis -antiplatelet therapy: Continue Plavix 75 mg daily 3. Pain Management:Neurontin 300 mg daily 4. Mood:Cymbalta 30 mg daily, Wellbutrin 150 mg daily, Xanax 0.5 mg twice daily as needed -antipsychotic agents: N/A 5. Neuropsych: This patientiscapable of making decisions on herown behalf. 6. Skin/Wound Care:Routine skin checks 7. Fluids/Electrolytes/Nutrition: encourage PO  -continue to replete K+  -recheck once more tomorrow am 8. Hypotension/bradycardia related to sepsis. WBC improved 12,800. Dopamine discontinued. Blood cultures no growth to date. Low-dose Aldactone 12.5 mg daily, Toprol-XL 50 mg daily. Daily monitoring of BP's at rest and with activity BP's better. Dc orthostatic bp's 9. Acute on chronic diastolic congestive heart failure. Demadex 20 mg daily.   Appreciate Follow-up heart failure team -weights up sl Filed Weights   09/25/18 0303 09/26/18  0417 09/27/18 0443  Weight: 104.2 kg 104 kg 104.1 kg    10. AKI/CKD stage III. Renal baseline 1.5-1.8. CR 8/24 1.7 11. PAF. Continue Eliquis. Cardiac rate controlled at present 12. CAD with CABG continue Plavix. 13. OSA/asthma. Intolerant of CPAP. 14. Diabetes mellitus. Hemoglobin A1c 7.0. Lantus insulin still at 38 units nightly. Check blood sugars before meals and at bedtime.  CBG (last 3)  Recent Labs    09/26/18 2100 09/27/18 0609 09/27/18 1135  GLUCAP 216* 133* 131*  poor control:increased lantus to 42U with improving patterns  -will need further adjustment as outpt    15. ID/MRSA PCR screening positive/pneumonia. Levaquin completed. 16hyperlipidemia. Crestor    LOS: 6 days A FACE TO FACE EVALUATION WAS PERFORMED  [name] 09/27/2018, 12:24 PM

## 2018-09-27 NOTE — Progress Notes (Signed)
Occupational Therapy Session Note  Patient Details  Name: [name] [name] [name]: [id number] Date of Birth: 1945-04-07  Today's Date: 09/27/2018 OT Individual Time: 0730-0830 OT Individual Time Calculation (min): 60 min    Short Term Goals: Week 1:  OT Short Term Goal 1 (Week 1): STGs equal to LTGs set at supervision level overall.  Skilled Therapeutic Interventions/Progress Updates:    Completed ADL retraining at overall Supervision to Mod I level.  Pt received upright in bed finishingbreakfast.  Pt completed bed mobility at overall Independent level and ambulated around room with RW with distant supervision to gather items for bathing at shower level.  Pt completed toilet transfer and walk-in shower transfer with RW with supervision.  Bathing completed at overall setup assist level and distant supervision - recommend supervision for bathing at shower level initially upon d/c with pt in agreement.  Completed dressing with supervision for LB dressing and Independent for UB.  Grooming tasks completed in standing without assistance.  Pt demonstrating good awareness and recall of education for all previous sessions.  Pt returned to sitting upright in recliner with all needs in reach.    Therapy Documentation Precautions:  Precautions Precautions: Fall Precaution Comments: hx of falls at home (~8 in last 6 months) Required Braces or Orthoses: (has L ankle brace at home) Restrictions Weight Bearing Restrictions: No Vital Signs: Therapy Vitals Temp: 98 F (36.7 C) Temp Source: Oral Pulse Rate: 71 Resp: 18 BP: (!) 145/69 Patient Position (if appropriate): Standing Oxygen Therapy SpO2: 94 % O2 Device: Room Air Pain: Pain Assessment Pain Scale: 0-10 Pain Score: 1  Pain Location: Ankle Pain Orientation: Left   Therapy/Group: Individual Therapy  Simonne Come 09/27/2018, 8:14 AM

## 2018-09-27 NOTE — Progress Notes (Signed)
Occupational Therapy Discharge Summary  Patient Details  Name: KHAMIA STAMBAUGH [name]: [id number] Date of Birth: [birth year]   Patient has met 10 of 10 long term goals due to improved activity tolerance, improved balance and ability to compensate for deficits.  Patient to discharge at overall Supervision level.  Patient's care partner is independent to provide the necessary intermittent assistance at discharge.    Reasons goals not met: N/A  Recommendation:  Patient will benefit from ongoing skilled OT services in home health setting to continue to advance functional skills in the area of BADL, iADL and Reduce care partner burden.  Equipment: No equipment provided  Reasons for discharge: treatment goals met and discharge from hospital  Patient/family agrees with progress made and goals achieved: Yes  OT Discharge Precautions/Restrictions  Precautions Precautions: Fall Restrictions Weight Bearing Restrictions: No Vital Signs Therapy Vitals Temp: 98 F (36.7 C) Temp Source: Oral Pulse Rate: 71 Resp: 18 BP: (!) 145/69 Patient Position (if appropriate): Standing Oxygen Therapy SpO2: 94 % O2 Device: Room Air Pain Pain Assessment Pain Scale: 0-10 Pain Score: 1  Pain Location: Ankle Pain Orientation: Left ADL ADL Eating: Independent Where Assessed-Eating: Wheelchair Grooming: Modified independent Where Assessed-Grooming: Standing at sink Upper Body Bathing: Supervision/safety Where Assessed-Upper Body Bathing: Shower Lower Body Bathing: Supervision/safety Where Assessed-Lower Body Bathing: Shower Upper Body Dressing: Independent Where Assessed-Upper Body Dressing: Chair Lower Body Dressing: Supervision/safety Where Assessed-Lower Body Dressing: Chair Toileting: Supervision/safety Where Assessed-Toileting: Glass blower/designer: Distant supervision Armed forces technical officer Method: Counselling psychologist: Raised Counselling psychologist: Distant  supervision Social research officer, government Method: Heritage manager: Civil engineer, contracting with back, Grab bars Vision Baseline Vision/History: Wears glasses Wears Glasses: At all times Patient Visual Report: No change from baseline Vision Assessment?: No apparent visual deficits Perception  Perception: Within Functional Limits Praxis Praxis: Intact Cognition Overall Cognitive Status: Within Functional Limits for tasks assessed Arousal/Alertness: Awake/alert Orientation Level: Oriented X4 Attention: Alternating Alternating Attention: Appears intact Memory: Appears intact Awareness: Appears intact Problem Solving: Appears intact Safety/Judgment: Appears intact Sensation Sensation Light Touch: Impaired Detail Peripheral sensation comments: decreased sensation in L foot/ankle Light Touch Impaired Details: Impaired LLE Hot/Cold: Appears Intact Proprioception: Appears Intact Stereognosis: Appears Intact Coordination Gross Motor Movements are Fluid and Coordinated: Yes Fine Motor Movements are Fluid and Coordinated: Yes Finger Nose Finger Test: [facility] bilaterally Motor  Motor Motor: Other (comment) Motor - Discharge Observations: generalized deconditioning Mobility  Transfers Sit to Stand: Independent with assistive device Stand to Sit: Independent with assistive device  Extremity/Trunk Assessment RUE Assessment RUE Assessment: Within Functional Limits LUE Assessment LUE Assessment: Within Functional Limits   Dijon Kohlman, [facility] 09/27/2018, 8:17 AM

## 2018-09-27 NOTE — Progress Notes (Signed)
Physical Therapy Session Note  Patient Details  Name: [name] [name] [name]: [id number] Date of Birth: 05-31-1945  Today's Date: 09/27/2018 PT Individual Time: 0921-1001 PT Individual Time Calculation (min): 40 min   Short Term Goals: Week 1:  PT Short Term Goal 1 (Week 1): = LTGs due to LOS  Skilled Therapeutic Interventions/Progress Updates:    Pt received sitting in recliner and is agreeable therapy session. Sit<>stands using RW mod-I throughout therapy session. Ambulated ~93ft to bathroom using RW with supervision. Sit<>stand toilet<>RW with supervision for safety - performed LB clothing management and peri-care with supervision - continent of bladder. Ambulated 151ft using RW with supervision to therapy gym - SpO2 99/5 and HR 80bpm. Therapist reinforced prior education regarding pt self-awareness to fatigue levels. Transported remainder of distance to car room in w/c for energy conservation. Performed ambulatory simulated car transfer (van height) using RW with supervision and pt demonstrating full recall and able to teach back how to correctly sequence car transfer without cuing. Ambulated ~99ft up/down ramp using RW with supervision for safety. Transported to main therapy gym in w/c. Performed stepping up/down 1 (4" height) step using RW x2 reps with close supervision to replicate home entrance - pt demonstrating teach back of prior education by properly  sequencing RW and LE stepping without cuing. Ambulated 153ft back to room using RW with supervision. Performed supine<>sit, HOB flat and no bedrails, independently. Stand pivot EOB>w/c using RW mod-I. Pt left sitting in w/c with needs in reach and chair alarm on.  Therapy Documentation Precautions:  Precautions Precautions: Fall Precaution Comments: hx of falls at home (~8 in last 6 months) Required Braces or Orthoses: (has L ankle brace at home) Restrictions Weight Bearing Restrictions: No   Pain:  Reports that her L ankle is feeling  much better today - defers therapist ACE wrapping it at this time.    Therapy/Group: Individual Therapy  Tawana Scale, PT, DPT 09/27/2018, 7:53 AM

## 2018-09-27 NOTE — Progress Notes (Signed)
Occupational Therapy Session Note  Patient Details  Name: [name] [name] [name]: [id number] Date of Birth: [birth year]  Today's Date: 09/27/2018 OT Individual Time: 1030-1100 OT Individual Time Calculation (min): 30 min    Short Term Goals: Week 1:  OT Short Term Goal 1 (Week 1): STGs equal to LTGs set at supervision level overall. Week 2:     Skilled Therapeutic Interventions/Progress Updates:    Pt resting in w/c upon arrival.  OT intervention with focus on functional amb, kitchen safety, and practicing shower transfers with step over ledge.  Pt amb from room to ADL apartment with rest break X 1. Reviewd kitchen safety and pt return demonstrated tasks.  Educated pt on safe technique entering walk-in shower.  Pt practiced stepping over into shower X 2 at supervision level.  Pt amb back to room with one rest break.  Pt remained in w/c with all needs within reach and seat alarm activated.  Pt pleased with progress and ready for discharge tomorrow.   Therapy Documentation Precautions:  Precautions Precautions: Fall Precaution Comments: hx of falls at home (~8 in last 6 months) Required Braces or Orthoses: (has L ankle brace at home) Restrictions Weight Bearing Restrictions: No  Pain:  Pt denies pain this morning.   Therapy/Group: Individual Therapy  Leroy Libman 09/27/2018, 12:24 PM

## 2018-09-27 NOTE — Progress Notes (Signed)
Physical Therapy Session Note  Patient Details  Name: [name] [name] [name]: [id number] Date of Birth: August 26, 1945  Today's Date: 09/27/2018 PT Individual Time: 1400-1500 PT Individual Time Calculation (min): 60 min   Short Term Goals: Week 1:  PT Short Term Goal 1 (Week 1): = LTGs due to LOS  Skilled Therapeutic Interventions/Progress Updates:    Pt received seated in recliner in room, agreeable to PT session. No complaints of pain. Pt performs all transfers at mod I level with RW. Toilet transfer mod I with RW. Ambulation 2 x 150 ft with RW and Supervision, ACE wrap to L ankle for improved stability. Per pt she does not perform stairs at home and her husband has installed a stair lift. Pt is able to retrieve 10 beanbag off of floor with use of RW and reacher with Supervision assist for safety. Reviewed pt's HEP: seated and standing BLE and ankle strengthening exercises. Pt left seated in recliner in room with needs in reach at end of session.  Therapy Documentation Precautions:  Precautions Precautions: Fall Precaution Comments: hx of falls at home (~8 in last 6 months) Required Braces or Orthoses: (has L ankle brace at home) Restrictions Weight Bearing Restrictions: No    Therapy/Group: Individual Therapy   Excell Seltzer, PT, DPT  09/27/2018, 4:03 PM

## 2018-09-27 NOTE — Patient Care Conference (Signed)
Inpatient RehabilitationTeam Conference and Plan of Care Update Date: 09/26/2018   Time: 10:35 AM    Patient Name: [name] [name]      Medical Record Number: [id number]  Date of Birth: [birth year] Sex: Female         Room/Bed: 4W26C/4W26C-01 Payor Info: Payor: MEDICARE / Plan: MEDICARE PART A AND B / Product Type: *No Product type* /    Admitting Diagnosis: 2. ABI Team  Debility, PNA; 13-14days  Admit Date/Time:  09/21/2018  3:53 PM Admission Comments: No comment available   Primary Diagnosis:  Debility Principal Problem: Debility  Patient Active Problem List   Diagnosis Date Noted  . Debility 09/21/2018  . Hypotension 09/18/2018  . Bradycardia 09/18/2018  . AKI (acute kidney injury) ([place]) 09/18/2018  . Pressure injury of skin 09/18/2018  . Ischemic cerebrovascular accident (CVA) of frontal lobe ([place]) 09/13/2018  . Fall 09/09/2018  . Stroke ([place]) 09/09/2018  . Type II diabetes mellitus with renal manifestations ([place]) 09/09/2018  . CKD (chronic kidney disease), stage III ([place]) 09/09/2018  . Sepsis ([place]) 09/09/2018  . Depression with anxiety 09/09/2018  . Slurred speech 09/09/2018  . Ankle fracture, left 05/16/2018  . Amiodarone pulmonary toxicity   . Physical deconditioning   . 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 Acute on chronic congestive heart failure ([place])   . Chronic systolic CHF (congestive heart failure) ([place]) 01/16/2018  . Acute on chronic diastolic CHF (congestive heart failure) ([place]) 01/16/2018  . Acute on chronic diastolic (congestive) heart failure ([place]) 01/16/2018  . Atrial fibrillation ([place]) 10/21/2017  . CAD (coronary artery disease) of bypass graft 10/20/2017  . Coronary artery disease of bypass graft of native heart with stable angina pectoris ([place])   . Chronic low back pain 08/24/2016  . Pain of left thumb 08/05/2016  . Nocturnal hypoxia 06/02/2016  . Hoarseness 04/05/2016  . Laryngopharyngeal reflux (LPR) 04/05/2016  . Pharyngoesophageal dysphagia 04/05/2016   . Angina decubitus ([place]) 05/22/2015  . Chronic insomnia 05/06/2015  . Common migraine 05/14/2014  . Abnormality of gait 05/14/2014  . Memory change 05/14/2014  . Aneurysm, cerebral, nonruptured 05/14/2014  . Cramp of limb-Left neck 05/10/2014  . Hematemesis with nausea   . Vomiting blood   . Dizziness and giddiness 09/21/2013  . Atypical chest pain 07/18/2013  . Unstable angina ([place]) 04/09/2013  . Carotid artery stenosis, symptomatic 03/20/2013  . Chronic diastolic CHF (congestive heart failure) ([place]) 09/23/2012  . Cerebrovascular disease 07/10/2012  . Cerebral artery occlusion with cerebral infarction ([place]) 07/10/2012  . Mitral regurgitation 04/15/2012  . TIA (transient ischemic attack) 04/15/2012  . Occlusion and stenosis of carotid artery without mention of cerebral infarction 08/18/2011  . Bariatric surgery status 06/17/2011  . Speech abnormality 03/22/2011  . Dyspnea 02/24/2011  . PAPILLARY MUSCLE DYSFUNCTION, NON-RHEUMATIC 10/09/2008  . UNSPECIFIED VITAMIN D DEFICIENCY 10/24/2007  . MYOCARDIAL INFARCTION, HX OF 10/24/2007  . PERSISTENT VOMITING 10/24/2007  . OSTEOARTHRITIS 10/24/2007  . MIGRAINES, HX OF 10/24/2007  . DM 01/16/2007  . Hyperlipemia 01/16/2007  . Obesity-post failed open gastroplasty 1984  01/16/2007  . OBSTRUCTIVE SLEEP APNEA 01/16/2007  . Essential hypertension 01/16/2007  . Coronary atherosclerosis 01/16/2007  . Asthma 01/16/2007  . GERD 01/16/2007  . VENTRAL HERNIA 01/16/2007    Expected Discharge Date: Expected Discharge Date: 09/28/18  Team Members Present: Physician leading conference: Dr. Alger Simons Social Worker Present: Lennart Pall, LCSW Nurse Present: Doy Hutching, LPN PT Present: Canary Brim, PT OT Present: Cherylynn Ridges, OT SLP Present: Weston Anna, SLP  PPS Coordinator present : Gunnar Fusi, SLP     Current Status/Progress Goal Weekly Team Focus  Medical   debility exacerbated by hypovolemia. fluid balanced now. pain  controlled  prepare medically for dc  see medical progress notes   Bowel/Bladder   Continent of bowel/bladder; LBM 09/25/18  Remain continent of bowel/bladder with mod I  Assist with bowel/bladder needs PRN   Swallow/Nutrition/ Hydration             ADL's   CGA - Supervision bathing, dressing, transfers with RW  Supervision overall  activity tolerance, dynamic standing balance, endurance, d/c planning   Mobility   supervision to CGA overall for transfers, gait and stairs  supervision/mod I overall  d/c planning, endurance, functional strengthening, stairs, gait, balance   Communication             Safety/Cognition/ Behavioral Observations            Pain   Conplained of leg pain; Tylenol PRN given and was effective  <2  Assess and treat pain q shift and as needed   Skin   Stage I to her butt but almost resolved  Maintain skin integrity with mod I  Assess skin q shift and as needed    Rehab Goals Patient on target to meet rehab goals: Yes *See Care Plan and progress notes for long and short-term goals.     Barriers to Discharge  Current Status/Progress Possible Resolutions Date Resolved   Physician    Medical stability        see medical progress notes      Nursing                  PT                    OT                  SLP                SW                Discharge Planning/Teaching Needs:  Plan home with spouse who can provide 24/7 assistance.  Teaching completed with spouse.   Team Discussion:  Retuning to CIR after brief acute stay and medically/ functionally [facility] better.  Slight nausea over weekend but good now.  Supervision overall with transfers and improving gait distance.  No concerns  Revisions to Treatment Plan:  NA    Continued Need for Acute Rehabilitation Level of Care: The patient requires daily medical management by a physician with specialized training in physical medicine and rehabilitation for the following conditions: Daily direction of a  multidisciplinary physical rehabilitation program to ensure safe treatment while eliciting the highest outcome that is of practical value to the patient.: Yes Daily medical management of patient stability for increased activity during participation in an intensive rehabilitation regime.: Yes Daily analysis of laboratory values and/or radiology reports with any subsequent need for medication adjustment of medical intervention for : Cardiac problems;Other   I attest that I was present, lead the team conference, and concur with the assessment and plan of the team.   Lennart Pall 09/27/2018, 2:26 PM    Team conference was held via web/ teleconference due to [place] - 19

## 2018-09-28 LAB — BASIC METABOLIC PANEL
Anion gap: 7 (ref 5–15)
BUN: 24 mg/dL — ABNORMAL HIGH (ref 8–23)
CO2: 30 mmol/L (ref 22–32)
Calcium: 9 mg/dL (ref 8.9–10.3)
Chloride: 104 mmol/L (ref 98–111)
Creatinine, Ser: 1.72 mg/dL — ABNORMAL HIGH (ref 0.44–1.00)
GFR calc Af Amer: 34 mL/min — ABNORMAL LOW (ref >60)
GFR calc non Af Amer: 30 mL/min — ABNORMAL LOW (ref >60)
Glucose, Bld: 111 mg/dL — ABNORMAL HIGH (ref 70–99)
Potassium: 4.1 mmol/L (ref 3.5–5.1)
Sodium: 141 mmol/L (ref 135–145)

## 2018-09-28 LAB — GLUCOSE, CAPILLARY: Glucose-Capillary: 107 mg/dL — ABNORMAL HIGH (ref 70–99)

## 2018-09-28 MED ORDER — ALPRAZOLAM 0.5 MG PO TABS: 0.5000 mg | ORAL_TABLET | Freq: Two times a day (BID) | ORAL | 0 refills | Status: DC | PRN

## 2018-09-28 MED ORDER — METOPROLOL SUCCINATE ER 50 MG PO TB24: 50.0000 mg | ORAL_TABLET | Freq: Every day | ORAL | 1 refills | Status: DC

## 2018-09-28 MED ORDER — DULOXETINE HCL 30 MG PO CPEP: 30.0000 mg | ORAL_CAPSULE | Freq: Every day | ORAL | 0 refills | Status: AC

## 2018-09-28 MED ORDER — CLOPIDOGREL BISULFATE 75 MG PO TABS: 75.0000 mg | ORAL_TABLET | Freq: Every day | ORAL | 1 refills | Status: DC

## 2018-09-28 MED ORDER — ACETAMINOPHEN 500 MG PO TABS: 500.0000 mg | ORAL_TABLET | Freq: Four times a day (QID) | ORAL | 0 refills | Status: DC | PRN

## 2018-09-28 MED ORDER — PANTOPRAZOLE SODIUM 40 MG PO TBEC: 40.0000 mg | DELAYED_RELEASE_TABLET | Freq: Every day | ORAL | 0 refills | Status: AC

## 2018-09-28 MED ORDER — DM-GUAIFENESIN ER 30-600 MG PO TB12: 1.0000 | ORAL_TABLET | Freq: Two times a day (BID) | ORAL | 0 refills | Status: DC

## 2018-09-28 MED ORDER — APIXABAN 5 MG PO TABS: 5.0000 mg | ORAL_TABLET | Freq: Two times a day (BID) | ORAL | 6 refills | Status: DC

## 2018-09-28 MED ORDER — BUPROPION HCL ER (XL) 150 MG PO TB24: 150.0000 mg | ORAL_TABLET | Freq: Every day | ORAL | 0 refills | Status: DC

## 2018-09-28 MED ORDER — ROSUVASTATIN CALCIUM 40 MG PO TABS: 40.0000 mg | ORAL_TABLET | Freq: Every day | ORAL | 0 refills | Status: DC

## 2018-09-28 MED ORDER — GABAPENTIN 300 MG PO CAPS: 300.0000 mg | ORAL_CAPSULE | Freq: Every day | ORAL | 1 refills | Status: DC

## 2018-09-28 MED ORDER — RANOLAZINE ER 500 MG PO TB12: 500.0000 mg | ORAL_TABLET | Freq: Two times a day (BID) | ORAL | 1 refills | Status: DC

## 2018-09-28 MED ORDER — SPIRONOLACTONE 25 MG PO TABS: 25.0000 mg | ORAL_TABLET | Freq: Every day | ORAL | 0 refills | Status: DC

## 2018-09-28 MED ORDER — TORSEMIDE 20 MG PO TABS: 20.0000 mg | ORAL_TABLET | Freq: Every day | ORAL | 0 refills | Status: DC

## 2018-09-28 NOTE — Progress Notes (Signed)
Patient received the discharged instructions from PA before she left accompanied by her husband.

## 2018-09-28 NOTE — Progress Notes (Signed)
[place] PHYSICAL MEDICINE & REHABILITATION PROGRESS NOTE   Subjective/Complaints:  Up in shower. Ready for discharge. Feels prepared.   ROS: Patient denies fever, rash, sore throat, blurred vision, nausea, vomiting, diarrhea, cough, shortness of breath or chest pain, joint or back pain, headache, or mood change.    Objective:   No results found. No results for input(s): WBC, HGB, HCT, PLT in the last 72 hours. Recent Labs    09/26/18 0630 09/28/18 0610  NA 141 141  K 3.8 4.1  CL 103 104  CO2 29 30  GLUCOSE 100* 111*  BUN 27* 24*  CREATININE 1.91* 1.72*  CALCIUM 9.1 9.0    Intake/Output Summary (Last 24 hours) at 09/28/2018 1036 Last data filed at 09/28/2018 0741 Gross per 24 hour  Intake 680 ml  Output -  Net 680 ml     Physical Exam: Vital Signs Blood pressure (!) 154/74, pulse 66, temperature 97.9 F (36.6 C), resp. rate 17, height 5\' 2"  (1.575 m), weight 105.7 kg, SpO2 100 %.  Constitutional: No distress . Vital signs reviewed. HEENT: EOMI, oral membranes moist Neck: supple Cardiovascular: RRR without murmur. No JVD    Respiratory: CTA Bilaterally without wheezes or rales. Normal effort    GI: BS +, non-tender, non-distended   Extremities: No clubbing, cyanosis, or edema Skin: No evidence of breakdown, no evidence of rash Neurologic: Cranial nerves II through XII intact, motor strength is 4/5 in bilateral deltoid, bicep, tricep, grip, hip flexor, knee extensors, ankle dorsiflexor and plantar flexor. Good standing balance with walker.  Musculoskeletal: Full range of motion in all 4 extremities. No joint swelling Psych: pleasant   Assessment/Plan: 1. Functional deficits secondary to Debility  which require 3+ hours per day of interdisciplinary therapy in a comprehensive inpatient rehab setting.  Physiatrist is providing close team supervision and 24 hour management of active medical problems listed below.  Physiatrist and rehab team continue to assess  barriers to discharge/monitor patient progress toward functional and medical goals  Care Tool:  Bathing    Body parts bathed by patient: Right arm, Left arm, Chest, Abdomen, Front perineal area, Buttocks, Right upper leg, Left upper leg, Face, Right lower leg, Left lower leg   Body parts bathed by helper: Right lower leg, Left lower leg     Bathing assist Assist Level: Supervision/Verbal cueing     Upper Body Dressing/Undressing Upper body dressing   What is the patient wearing?: Pull over shirt, Bra    Upper body assist Assist Level: Independent    Lower Body Dressing/Undressing Lower body dressing      What is the patient wearing?: Pants, Underwear/pull up     Lower body assist Assist for lower body dressing: Supervision/Verbal cueing     Toileting Toileting    Toileting assist Assist for toileting: Supervision/Verbal cueing     Transfers Chair/bed transfer  Transfers assist     Chair/bed transfer assist level: Independent with assistive device Chair/bed transfer assistive device: Programmer, multimedia   Ambulation assist      Assist level: Supervision/Verbal cueing Assistive device: Walker-rolling Max distance: 150'   Walk 10 feet activity   Assist     Assist level: Supervision/Verbal cueing Assistive device: Walker-rolling   Walk 50 feet activity   Assist Walk 50 feet with 2 turns activity did not occur: Safety/medical concerns(low BP/endurance)  Assist level: Supervision/Verbal cueing Assistive device: Walker-rolling    Walk 150 feet activity   Assist Walk 150 feet activity did not occur:  Safety/medical concerns(low BP/endurance)  Assist level: Supervision/Verbal cueing Assistive device: Walker-rolling    Walk 10 feet on uneven surface  activity   Assist Walk 10 feet on uneven surfaces activity did not occur: Safety/medical concerns   Assist level: Supervision/Verbal cueing(up/down ramp) Assistive device:  Aeronautical engineer Will patient use wheelchair at discharge?: Yes(dependent for transport; ambulatory in the home) Type of Wheelchair: Manual    Wheelchair assist level: Contact Guard/Touching assist Max wheelchair distance: 31'    Wheelchair 50 feet with 2 turns activity    Assist        Assist Level: Contact Guard/Touching assist   Wheelchair 150 feet activity     Assist  Wheelchair 150 feet activity did not occur: Safety/medical concerns(endurance)       Blood pressure (!) 154/74, pulse 66, temperature 97.9 F (36.6 C), resp. rate 17, height 5\' 2"  (1.575 m), weight 105.7 kg, SpO2 100 %.  Medical Problem List and Plan: 1.Decreased functional mobilitysecondary to subacute to chronic right frontal lobe infarction/acute respiratory failure with sepsis lobar pneumonia as well as hypotension with bradycardia dc today, HH follow up  Patient to see Rehab MD/provider in the office for transitional care encounter in 1-2 weeks.  2. Antithrombotics: -DVT/anticoagulation:Eliquis -antiplatelet therapy: Continue Plavix 75 mg daily 3. Pain Management:Neurontin 300 mg daily 4. Mood:Cymbalta 30 mg daily, Wellbutrin 150 mg daily, Xanax 0.5 mg twice daily as needed -antipsychotic agents: N/A 5. Neuropsych: This patientiscapable of making decisions on herown behalf. 6. Skin/Wound Care:Routine skin checks 7. Fluids/Electrolytes/Nutrition: encourage PO  -continue to replete K+  -I personally reviewed the patient's labs today.   8. Hypotension/bradycardia related to sepsis. WBC improved 12,800. Dopamine discontinued. Blood cultures no growth to date. Low-dose Aldactone 12.5 mg daily, Toprol-XL 50 mg daily. Daily monitoring of BP's at rest and with activity BP's better.   9. Acute on chronic diastolic congestive heart failure. Demadex 20 mg daily.   Appreciate Follow-up heart failure  team -weights up sl Filed Weights   09/26/18 0417 09/27/18 0443 09/28/18 0546  Weight: 104 kg 104.1 kg 105.7 kg    10. AKI/CKD stage III. Renal baseline 1.5-1.8. CR 8/24 1.7 11. PAF. Continue Eliquis. Cardiac rate controlled at present 12. CAD with CABG continue Plavix. 13. OSA/asthma. Intolerant of CPAP. 14. Diabetes mellitus. Hemoglobin A1c 7.0.     Check blood sugars before meals and at bedtime.  CBG (last 3)  Recent Labs    09/27/18 1646 09/27/18 2100 09/28/18 0553  GLUCAP 154* 176* 107*  improving control:increased lantus to 42U with improving patterns  -will need further adjustment as outpt    15. ID/MRSA PCR screening positive/pneumonia. Levaquin completed. 16hyperlipidemia. Crestor    LOS: 7 days A FACE TO FACE EVALUATION WAS PERFORMED  [name] 09/28/2018, 10:36 AM

## 2018-09-29 NOTE — Progress Notes (Signed)
Social Work Discharge Note   The overall goal for the admission was met for:   Discharge location: Yes - home with spouse who can provide 24/7 assistance  Length of Stay: Yes - 7 days  Discharge activity level: Yes - supervision  Home/community participation: Yes  Services provided included: MD, RD, PT, OT, RN, TR, Pharmacy and SW  Financial Services: Medicare and Private Insurance: [employer]  Follow-up services arranged: Home Health: PT, OT via [facility] and Patient/Family request agency HH: [facility], DME: NA  Comments (or additional information):    Contact info:: pt @ [phone]     Spouse, David Polito @ [phone]  Patient/Family verbalized understanding of follow-up arrangements: Yes  Individual responsible for coordination of the follow-up plan: pt  Confirmed correct DME delivered:  NA - pt already had needed DME    ,  

## 2018-10-02 ENCOUNTER — Telehealth: Payer: Self-pay

## 2018-10-02 NOTE — Telephone Encounter (Signed)
  [place]  Patient Name: [name] [name] DOB: 01/22/1946 Appointment Date and Time: 10-12-2018 / 2:00pm With: Zella Ball first then Dr. Letta Pate  Questions for our staff to ask patients on Transitional care 48 hour phone call:   1. Are you/is patient experiencing any problems since coming home? Are there any questions regarding any aspect of care? NO  2. Are there any questions regarding medications administration/dosing? Are meds being taken as prescribed? Patient should review meds with caller to confirm.  No, and taking  3. Have there been any falls? Yes, no injury to area  4. Has Home Health been to the house and/or have they contacted you? If not, have you tried to contact them? Can we help you contact them? Yes  5. Are bowels and bladder emptying properly? Are there any unexpected incontinence issues? If applicable, is patient following bowel/bladder programs?  yes  6. Any fevers, problems with breathing, unexpected pain? no  7. Are there any skin problems or new areas of breakdown? No  8. Has the patient/family member arranged specialty MD follow up (ie cardiology/neurology/renal/surgical/etc)? Can we help arrange? Yes  9. Does the patient need any other services or support that we can help arrange? NO  10. Are caregivers following through as expected in assisting the patient? Yes  11. Has the patient quit smoking, drinking alcohol, or using drugs as recommended? NA  [facility] [address]  [phone]

## 2018-10-06 ENCOUNTER — Telehealth (HOSPITAL_COMMUNITY): Payer: Self-pay | Admitting: *Deleted

## 2018-10-06 MED ORDER — AMLODIPINE BESYLATE 5 MG PO TABS: 5.0000 mg | ORAL_TABLET | Freq: Every day | ORAL | 3 refills | Status: DC

## 2018-10-06 NOTE — Telephone Encounter (Signed)
Make sure she is taking Toprol XL 50 mg daily and start amlodipine 5 mg daily.

## 2018-10-06 NOTE — Telephone Encounter (Signed)
Pt states she is taking toprol XL 50mg . Pt aware and agreeable with adding amlodipine 5mg  daily. Script sent to pharmacy.

## 2018-10-06 NOTE — Telephone Encounter (Signed)
Home health physical therapist called to report pts bp was 200/108 today. Pt is asymptomatic and taking all medications as prescribed.  Routed to [place] for advice

## 2018-10-12 ENCOUNTER — Encounter: Payer: Medicare Other | Attending: Registered Nurse | Admitting: Registered Nurse

## 2018-10-12 ENCOUNTER — Encounter: Payer: Self-pay | Admitting: Registered Nurse

## 2018-10-12 ENCOUNTER — Other Ambulatory Visit: Payer: Self-pay

## 2018-10-12 VITALS — BP 154/81 | HR 69 | Temp 97.7°F | Ht 62.0 in | Wt 237.0 lb

## 2018-10-12 DIAGNOSIS — I10 Essential (primary) hypertension: Secondary | ICD-10-CM | POA: Insufficient documentation

## 2018-10-12 DIAGNOSIS — M545 Low back pain, unspecified: Secondary | ICD-10-CM

## 2018-10-12 DIAGNOSIS — I4891 Unspecified atrial fibrillation: Secondary | ICD-10-CM | POA: Diagnosis present

## 2018-10-12 DIAGNOSIS — F418 Other specified anxiety disorders: Secondary | ICD-10-CM | POA: Diagnosis present

## 2018-10-12 DIAGNOSIS — B49 Unspecified mycosis: Secondary | ICD-10-CM | POA: Diagnosis present

## 2018-10-12 DIAGNOSIS — Z794 Long term (current) use of insulin: Secondary | ICD-10-CM | POA: Insufficient documentation

## 2018-10-12 DIAGNOSIS — R5381 Other malaise: Secondary | ICD-10-CM | POA: Insufficient documentation

## 2018-10-12 DIAGNOSIS — I5022 Chronic systolic (congestive) heart failure: Secondary | ICD-10-CM | POA: Insufficient documentation

## 2018-10-12 DIAGNOSIS — I639 Cerebral infarction, unspecified: Secondary | ICD-10-CM | POA: Diagnosis not present

## 2018-10-12 DIAGNOSIS — E1140 Type 2 diabetes mellitus with diabetic neuropathy, unspecified: Secondary | ICD-10-CM | POA: Diagnosis not present

## 2018-10-12 DIAGNOSIS — G8929 Other chronic pain: Secondary | ICD-10-CM | POA: Insufficient documentation

## 2018-10-12 MED ORDER — KETOCONAZOLE 2 % EX CREA: 1.0000 "application " | TOPICAL_CREAM | Freq: Every day | CUTANEOUS | 0 refills | Status: DC

## 2018-10-12 NOTE — Progress Notes (Signed)
Subjective:    Patient ID: [name] [name], female    DOB: 1945/05/27, 73 y.o.   [name]: [id number]  HPI: [name] [name] is a 73 y.o. female who is here for transitional care visit in follow up of her debility, atrial fibrillation, essential hypertension, depression/anxiety, chronic low back pain without sciatica and mycosis.  [name] presented to [name] on 09/09/2018 with complaints of slurred speech as well three falls. Neurology was consulted.   CT Head WO Contrast:  IMPRESSION: 1. New areas of hypoattenuation in the subcortical white matter of the right frontal lobe may represent small age-indeterminate ischemic infarcts. 2. Chronic right occipital lobe infarct. 3. No evidence of traumatic injury to the brain.  DG Chest X-ray:  IMPRESSION: Cardiomegaly with increased vascular congestion and probable mild pulmonary edema. No confluent airspace opacity or pneumothorax.  DG: Left Femur:  IMPRESSION: No evidence of acute fracture or dislocation.  DG Ribs Unilateral Left:  IMPRESSION: No evidence of left-sided rib fracture, pleural effusion or Pneumothorax.  MR Brain WO Contrast:  IMPRESSION: 1. No acute ischemic infarct or intracranial hemorrhage. 2. Multiple rounded areas of peripheral hyperintense T2-weighted signal within the bilateral supratentorial white matter, corresponding to the areas of hypoattenuation on the earlier head CT. These likely indicate late subacute to early chronic infarcts, not present on prior MRIs. No diffusion restriction to indicate acute ischemia. 3. Severe intracranial atherosclerosis with short segment occlusion of the proximal left middle cerebral artery with normal enhancement of the distal left M1 segment and the M2 branches, new compared to 06/01/2014. 4. Severe stenosis of the left PCA P1 and P2 segments, unchanged compared to 06/01/2014. 5. Severe stenoses of both internal carotid arteries at the skull base, progressed  compared to 06/01/2014.  [name] [name] was continued on her Eliquis and Plavix/   [name] [name] was admitted to inpatient Rehabilitation on 09/14/2018 and was discharged to acute care, see [name] Rinne, PA-C note for more detail.   She was readmitted to inpatient rehabilitation on 09/21/2018 and discharged home on 09/28/2018, she is receiving outpatient therapy with [facility]. She states she has pain in her lower back, bilateral knees L>R and left ankle. She rates her pain 1. Also states her appetite is improving.   [name] asked about a rash on her left forearm stating she had it while in hospital, fungus  Noted ( ( Mycosis), Dr. Letta Pate assed with this provider and agrees with plan, ketoconazole cream ordered, she verbalizes understanding.   Pain Inventory Average Pain 1 Pain Right Now 1 My pain is aching  In the last 24 hours, has pain interfered with the following? General activity 3 Relation with others 2 Enjoyment of life 2 What TIME of day is your pain at its worst? daytime Sleep (in general) Fair  Pain is worse with: walking and standing Pain improves with: rest and heat/ice Relief from Meds: 5  Mobility walk with assistance use a walker how many minutes can you walk? 10 ability to climb steps?  no do you drive?  yes use a wheelchair transfers alone  Function retired I need assistance with the following:  meal prep, household duties and shopping  Neuro/Psych weakness trouble walking dizziness depression anxiety  Prior Studies Any changes since last visit?  no x-rays CT/MRI  Physicians involved in your care Any changes since last visit?  no Primary care [place]   Family History  Problem Relation Age of Onset  .  Heart disease Father        Heart Disease before age 59  . Diabetes Father   . Hyperlipidemia Father   . Hypertension Father   . Heart attack Father   . Deep vein thrombosis  Father   . AAA (abdominal aortic aneurysm) Father   . Hypertension Mother   . Deep vein thrombosis Mother   . AAA (abdominal aortic aneurysm) Mother   . Cancer Sister        Ovarian  . Hypertension Sister   . Diabetes Paternal Grandmother   . Heart disease Paternal Uncle   . Diabetes Paternal Uncle   . Heart attack Paternal Grandfather 30       died of MI at 30  . Diabetes Paternal Aunt   . Diabetes Paternal Uncle   . Colon cancer Neg Hx    Social History   Socioeconomic History  . Marital status: Married    Spouse name: [name] [name]   . Number of children: 1  . Years of education: 12+  . Highest education level: Not on file  Occupational History  . Occupation: retired  Scientific laboratory technician  . Financial resource strain: Not on file  . Food insecurity    Worry: Not on file    Inability: Not on file  . Transportation needs    Medical: Not on file    Non-medical: Not on file  Tobacco Use  . Smoking status: Never Smoker  . Smokeless tobacco: Never Used  Substance and Sexual Activity  . Alcohol use: Never    Frequency: Never  . Drug use: Never  . Sexual activity: Not Currently    Birth control/protection: Surgical    Comment: hysterectomy  Lifestyle  . Physical activity    Days per week: Not on file    Minutes per session: Not on file  . Stress: Not on file  Relationships  . Social Herbalist on phone: Not on file    Gets together: Not on file    Attends religious service: Not on file    Active member of club or organization: Not on file    Attends meetings of clubs or organizations: Not on file    Relationship status: Not on file  Other Topics Concern  . Not on file  Social History Narrative   Patient lives at home with husband [name] [name].    Patient has 1 child.    Patient is not currently working.    Patient has 2 years of college.    Patient is right handed.   Patient drinks caffeine occasionally.   Past Surgical History:  Procedure Laterality Date  . ANKLE  FRACTURE SURGERY Left 1970's  . APPENDECTOMY  1970's   w/hysterectomy  . BASAL CELL CARCINOMA EXCISION Left 05/2014   "shoulder" (01/16/2018)  . CARDIAC CATHETERIZATION  10/10/2012   Dr [name].  Marland Kitchen CARDIAC CATHETERIZATION N/A 05/29/2015   Procedure: Right/Left Heart Cath and Coronary/Graft Angiography;  Surgeon: Larey Dresser, MD;  Location: [facility];  Service: Cardiovascular;  Laterality: N/A;  . CAROTID ENDARTERECTOMY Left 03/2013  . CAROTID STENT INSERTION Left 03/20/2013   Procedure: CAROTID STENT INSERTION;  Surgeon: [name], MD;  Location: [facility] CATH LAB;  Service: Cardiovascular;  Laterality: Left;  internal carotid  . CEREBRAL ANGIOGRAM N/A 04/05/2011   Procedure: CEREBRAL ANGIOGRAM;  Surgeon: Angelia Mould, MD;  Location: [facility] CATH LAB;  Service: Cardiovascular;  Laterality: N/A;  . CHOLECYSTECTOMY OPEN  2004  . CORONARY  ANGIOPLASTY WITH STENT PLACEMENT  01,02,05,06,07,08,11; 04/24/2013   "I've probably got ~ 10 stents by now" (04/24/2013)  . CORONARY ANGIOPLASTY WITH STENT PLACEMENT  06/13/2013   "got 4 stents today" (06/13/2013)  . CORONARY ARTERY BYPASS GRAFT  1220/11   "CABG X5"  . CORONARY STENT INTERVENTION N/A 10/20/2017   Procedure: CORONARY STENT INTERVENTION;  Surgeon: Troy Sine, MD;  Location: [place];  Service: Cardiovascular;  Laterality: N/A;  . ESOPHAGOGASTRODUODENOSCOPY  08/03/2011   Procedure: ESOPHAGOGASTRODUODENOSCOPY (EGD);  Surgeon: Shann Medal, MD;  Location: [facility];  Service: General;  Laterality: N/A;  . ESOPHAGOGASTRODUODENOSCOPY (EGD) WITH PROPOFOL N/A 03/11/2014   Procedure: ESOPHAGOGASTRODUODENOSCOPY (EGD) WITH PROPOFOL;  Surgeon: Lafayette Dragon, MD;  Location: [facility];  Service: Endoscopy;  Laterality: N/A;  . FRACTURE SURGERY    . gall stone removal  05/2003  . GASTRIC RESTRICTION SURGERY  1984   "stapeling"  . HERNIA REPAIR  2004   "in my stomach; had OR on it twice", wire mesh on 1 hernia  . LEFT HEART CATH  AND CORS/GRAFTS ANGIOGRAPHY N/A 07/09/2016   Procedure: LEFT HEART CATH AND CORS/GRAFTS ANGIOGRAPHY;  Surgeon: Larey Dresser, MD;  Location: [facility];  Service: Cardiovascular;  Laterality: N/A;  . ORIF ANKLE FRACTURE Left 05/16/2018  . ORIF ANKLE FRACTURE Left 05/16/2018   Procedure: OPEN REDUCTION INTERNAL FIXATION (ORIF) Left ankle with possible syndesmosis fixation;  Surgeon: Nicholes Stairs, MD;  Location: [facility];  Service: Orthopedics;  Laterality: Left;  123min  . OVARY SURGERY  1970's   "tumor removed"  . PERCUTANEOUS CORONARY STENT INTERVENTION (PCI-S) N/A 06/13/2013   Procedure: PERCUTANEOUS CORONARY STENT INTERVENTION (PCI-S);  Surgeon: [name], MD;  Location[facility] CATH LAB;  Service: Cardiovascular;  Laterality: N/A;  . PERCUTANEOUS STENT INTERVENTION N/A 04/24/2013   Procedure: PERCUTANEOUS STENT INTERVENTION;  Surgeon: [name], MD;  Location: [facility] CATH LAB;  Service: Cardiovascular;  Laterality: N/A;  . RIGHT/LEFT HEART CATH AND CORONARY/GRAFT ANGIOGRAPHY N/A 10/20/2017   Procedure: RIGHT/LEFT HEART CATH AND CORONARY/GRAFT ANGIOGRAPHY;  Surgeon: Larey Dresser, MD;  Location: [facility];  Service: Cardiovascular;  Laterality: N/A;  . ROOT CANAL  10/2000  . TIBIA FRACTURE SURGERY Right 1970's   rods and pins  . TOOTH EXTRACTION     "1 on the upper; wisdom tooth on the lower" (01/16/2018)  . TOTAL ABDOMINAL HYSTERECTOMY  1970's   w/ appendectomy   Past Medical History:  Diagnosis Date  . Abnormality of gait 05/14/2014  . Anemia    hx  . Anginal pain ([place])   . Anxiety   . Asthma   . Basal cell carcinoma 05/2014   "left shoulder"  . Bundle branch block, left    chronic/notes 07/18/2013  . CHF (congestive heart failure) ([place])   . Chronic bronchitis ([place])    "off and on all the time" (07/18/2013)  . Chronic insomnia 05/06/2015  . Chronic kidney disease    frequency, sees dr Jamal Maes every 4 to 6 months (01/16/2018)  . Chronic  low back pain 08/24/2016  . Chronic lower back pain   . Claustrophobia   . Common migraine 05/14/2014  . Coronary artery disease   . Depression   . Dysrhythmia   . GERD (gastroesophageal reflux disease)   . H/O hiatal hernia   . Headache    "at least 2/month" (01/16/2018)  . Heart murmur   . Hyperlipidemia   . Hypertension   . Irregular  heart beat   . Leg cramps    both legs at times  . Melanoma ([place]) 05/2014   "burned off BLE" (01/16/2018)  . Memory change 05/14/2014  . Migraine    "5-6/year"  (01/16/2018)  . Myocardial infarction ([place]) 04/1999, 02/2000, 01/2005; 2011; 2014  . Neuromuscular disorder ([place])    ?  . Obesity 01-2010  . Obstructive sleep apnea    "can't wear machine; I have claustrophobia" (01/16/2018), states she had a 2nd sleep study and does not have sleep apnea, her O2 decreases and now is on 2 L of O2 at night.  . On home oxygen therapy    "2L at night and prn during daytime" (01/16/2018)  . Osteoarthritis    "knees and hands" (01/16/2018)  . Other and unspecified angina pectoris   . Peripheral vascular disease (HCC)    ? numbness, tingling arms and legs  . Pneumonia 2000's   "once"  . PONV (postoperative nausea and vomiting)   . Shortness of breath    with exertion  . Stroke ([place]) 03/22/12   right side brain; denies residual on 07/18/2013)  . Stroke [facility]) Oct. 2015   Affected pt.s balance  . Stroke [facility]) 10/2014   "affected my legs; fully recovered now"; still have sporatic memory issues from this one" (01/16/2018)  . Type II diabetes mellitus ([place])   . Ventral hernia    hx of  . Vomiting    persistent  . Vomiting blood    BP (!) 154/81   Pulse 69   Temp 97.7 F (36.5 C)   Ht 5\' 2"  (1.575 m)   Wt 237 lb (107.5 kg)   SpO2 92%   BMI 43.35 kg/m   Opioid Risk Score:   Fall Risk Score:  `1  Depression screen PHQ 2/9  Depression screen [facility] 2/9 10/12/2018 10/28/2016 09/29/2016  Decreased Interest 2 0 0  Down, Depressed, Hopeless 1 - 0  PHQ - 2  Score 3 0 0  Altered sleeping 1 - -  Tired, decreased energy 1 - -  Change in appetite 0 - -  Feeling bad or failure about yourself  1 - -  Trouble concentrating 1 - -  Moving slowly or fidgety/restless 0 - -  Suicidal thoughts 0 - -  PHQ-9 Score 7 - -  Difficult doing work/chores Somewhat difficult - -  Some recent data might be hidden     Review of Systems  Constitutional: Negative.   HENT: Negative.   Eyes: Negative.   Respiratory: Negative.   Cardiovascular: Negative.   Gastrointestinal: Positive for nausea.  Endocrine: Negative.   Genitourinary: Negative.   Musculoskeletal: Positive for gait problem.  Skin: Negative.   Allergic/Immunologic: Negative.   Neurological: Positive for dizziness and weakness.  Hematological: Negative.   Psychiatric/Behavioral: Positive for dysphoric mood. The patient is nervous/anxious.   All other systems reviewed and are negative.      Objective:   Physical Exam Vitals signs and nursing note reviewed.  Constitutional:      Appearance: Normal appearance.  Neck:     Musculoskeletal: Normal range of motion and neck supple.  Cardiovascular:     Rate and Rhythm: Normal rate.     Pulses: Normal pulses.     Heart sounds: Normal heart sounds.  Pulmonary:     Effort: Pulmonary effort is normal.     Breath sounds: Normal breath sounds.  Musculoskeletal:     Comments: Normal Muscle Bulk and Muscle Testing Reveals:  Upper Extremities:Full  ROM and Muscle Strength 5/5  Lumbar Paraspinal Tenderness: L-4-L-5 Lower Extremities: Full ROM and Muscle Strength 5/5 Right Lower Extremity Flexion Produces Pain into Right Patella Left Lower Extremity Flexion Produces Pain into left ankle and left foot Arises from chair slowly using walker for support  Narrow Based Gait   Skin:    General: Skin is warm and dry.     Comments: Left Forearm ( Mycosis)  Neurological:     Mental Status: She is alert and oriented to person, place, and time.  Psychiatric:         Mood and Affect: Mood normal.        Behavior: Behavior normal.           Assessment & Plan:  1. Debility: Continue Outpatient Therapy with [facility].  2. Atrial Fibrillation: Continue current medication regimen: Cardiology Following.  3. Essential Hypertension: Continue current medication regimen.  4. Chronic Systolic CHF: Cardiology Following.  5. Depression/ Anxiety: Continue current medication regimen. PCP Following.  6. Chronic Low Back Pain without sciatica: Continue HEP as Tolerated. Continue to Monitor.  7. Mycosis Left Forearm: RX: Ketoconazole  30 minutes of face to face patient care time was spent during this visit. All questions were encouraged and answered.  F/U with Dr. Letta Pate in 4- 6 weeks

## 2018-10-17 ENCOUNTER — Other Ambulatory Visit: Payer: Self-pay

## 2018-10-17 ENCOUNTER — Encounter (HOSPITAL_COMMUNITY): Payer: Self-pay

## 2018-10-17 ENCOUNTER — Ambulatory Visit (HOSPITAL_COMMUNITY)
Admission: RE | Admit: 2018-10-17 | Discharge: 2018-10-17 | Disposition: A | Discharge status: Home / Self Care | Payer: Medicare Other | Source: Ambulatory Visit | Attending: Adult Health | Admitting: Adult Health

## 2018-10-17 VITALS — BP 125/83 | HR 57 | Wt 241.4 lb

## 2018-10-17 DIAGNOSIS — F419 Anxiety disorder, unspecified: Secondary | ICD-10-CM | POA: Insufficient documentation

## 2018-10-17 DIAGNOSIS — Z951 Presence of aortocoronary bypass graft: Secondary | ICD-10-CM | POA: Insufficient documentation

## 2018-10-17 DIAGNOSIS — E669 Obesity, unspecified: Secondary | ICD-10-CM | POA: Insufficient documentation

## 2018-10-17 DIAGNOSIS — I252 Old myocardial infarction: Secondary | ICD-10-CM | POA: Diagnosis not present

## 2018-10-17 DIAGNOSIS — Z8673 Personal history of transient ischemic attack (TIA), and cerebral infarction without residual deficits: Secondary | ICD-10-CM | POA: Insufficient documentation

## 2018-10-17 DIAGNOSIS — I2510 Atherosclerotic heart disease of native coronary artery without angina pectoris: Secondary | ICD-10-CM | POA: Diagnosis not present

## 2018-10-17 DIAGNOSIS — Z85828 Personal history of other malignant neoplasm of skin: Secondary | ICD-10-CM | POA: Insufficient documentation

## 2018-10-17 DIAGNOSIS — J45909 Unspecified asthma, uncomplicated: Secondary | ICD-10-CM | POA: Insufficient documentation

## 2018-10-17 DIAGNOSIS — Z7901 Long term (current) use of anticoagulants: Secondary | ICD-10-CM | POA: Insufficient documentation

## 2018-10-17 DIAGNOSIS — I25119 Atherosclerotic heart disease of native coronary artery with unspecified angina pectoris: Secondary | ICD-10-CM

## 2018-10-17 DIAGNOSIS — I480 Paroxysmal atrial fibrillation: Secondary | ICD-10-CM | POA: Insufficient documentation

## 2018-10-17 DIAGNOSIS — G4733 Obstructive sleep apnea (adult) (pediatric): Secondary | ICD-10-CM | POA: Insufficient documentation

## 2018-10-17 DIAGNOSIS — Z85820 Personal history of malignant melanoma of skin: Secondary | ICD-10-CM | POA: Insufficient documentation

## 2018-10-17 DIAGNOSIS — Z7902 Long term (current) use of antithrombotics/antiplatelets: Secondary | ICD-10-CM | POA: Insufficient documentation

## 2018-10-17 DIAGNOSIS — I130 Hypertensive heart and chronic kidney disease with heart failure and stage 1 through stage 4 chronic kidney disease, or unspecified chronic kidney disease: Secondary | ICD-10-CM | POA: Diagnosis not present

## 2018-10-17 DIAGNOSIS — E1151 Type 2 diabetes mellitus with diabetic peripheral angiopathy without gangrene: Secondary | ICD-10-CM | POA: Insufficient documentation

## 2018-10-17 DIAGNOSIS — N183 Chronic kidney disease, stage 3 unspecified: Secondary | ICD-10-CM

## 2018-10-17 DIAGNOSIS — I10 Essential (primary) hypertension: Secondary | ICD-10-CM | POA: Diagnosis not present

## 2018-10-17 DIAGNOSIS — Z888 Allergy status to other drugs, medicaments and biological substances status: Secondary | ICD-10-CM | POA: Diagnosis not present

## 2018-10-17 DIAGNOSIS — Z881 Allergy status to other antibiotic agents status: Secondary | ICD-10-CM | POA: Diagnosis not present

## 2018-10-17 DIAGNOSIS — Z880 Allergy status to penicillin: Secondary | ICD-10-CM | POA: Diagnosis not present

## 2018-10-17 DIAGNOSIS — N184 Chronic kidney disease, stage 4 (severe): Secondary | ICD-10-CM | POA: Insufficient documentation

## 2018-10-17 DIAGNOSIS — Z955 Presence of coronary angioplasty implant and graft: Secondary | ICD-10-CM | POA: Diagnosis not present

## 2018-10-17 DIAGNOSIS — Z833 Family history of diabetes mellitus: Secondary | ICD-10-CM | POA: Insufficient documentation

## 2018-10-17 DIAGNOSIS — Z79899 Other long term (current) drug therapy: Secondary | ICD-10-CM | POA: Diagnosis not present

## 2018-10-17 DIAGNOSIS — Z7951 Long term (current) use of inhaled steroids: Secondary | ICD-10-CM | POA: Insufficient documentation

## 2018-10-17 DIAGNOSIS — Z9981 Dependence on supplemental oxygen: Secondary | ICD-10-CM | POA: Diagnosis not present

## 2018-10-17 DIAGNOSIS — I447 Left bundle-branch block, unspecified: Secondary | ICD-10-CM | POA: Insufficient documentation

## 2018-10-17 DIAGNOSIS — Z8249 Family history of ischemic heart disease and other diseases of the circulatory system: Secondary | ICD-10-CM | POA: Diagnosis not present

## 2018-10-17 DIAGNOSIS — E1122 Type 2 diabetes mellitus with diabetic chronic kidney disease: Secondary | ICD-10-CM | POA: Insufficient documentation

## 2018-10-17 DIAGNOSIS — E785 Hyperlipidemia, unspecified: Secondary | ICD-10-CM | POA: Insufficient documentation

## 2018-10-17 DIAGNOSIS — K219 Gastro-esophageal reflux disease without esophagitis: Secondary | ICD-10-CM | POA: Insufficient documentation

## 2018-10-17 DIAGNOSIS — I5032 Chronic diastolic (congestive) heart failure: Secondary | ICD-10-CM | POA: Diagnosis not present

## 2018-10-17 LAB — BASIC METABOLIC PANEL
Anion gap: 13 (ref 5–15)
BUN: 18 mg/dL (ref 8–23)
CO2: 27 mmol/L (ref 22–32)
Calcium: 9.2 mg/dL (ref 8.9–10.3)
Chloride: 100 mmol/L (ref 98–111)
Creatinine, Ser: 1.45 mg/dL — ABNORMAL HIGH (ref 0.44–1.00)
GFR calc Af Amer: 42 mL/min — ABNORMAL LOW (ref >60)
GFR calc non Af Amer: 36 mL/min — ABNORMAL LOW (ref >60)
Glucose, Bld: 187 mg/dL — ABNORMAL HIGH (ref 70–99)
Potassium: 4.5 mmol/L (ref 3.5–5.1)
Sodium: 140 mmol/L (ref 135–145)

## 2018-10-17 LAB — CBC
HCT: 35.9 % — ABNORMAL LOW (ref 36.0–46.0)
Hemoglobin: 10.6 g/dL — ABNORMAL LOW (ref 12.0–15.0)
MCH: 28.3 pg (ref 26.0–34.0)
MCHC: 29.5 g/dL — ABNORMAL LOW (ref 30.0–36.0)
MCV: 95.7 fL (ref 80.0–100.0)
Platelets: 175 10*3/uL (ref 150–400)
RBC: 3.75 MIL/uL — ABNORMAL LOW (ref 3.87–5.11)
RDW: 17.1 % — ABNORMAL HIGH (ref 11.5–15.5)
WBC: 8.9 10*3/uL (ref 4.0–10.5)
nRBC: 0 % (ref 0.0–0.2)

## 2018-10-17 NOTE — Progress Notes (Signed)
ID:  [name] [name], DOB 1945-11-18, [name] [id number]  Location: Home  Provider location: [address], [place] Type of Visit: Established patient   PCP:  Derinda Late, MD  Cardiologist:  Larae Grooms, MD Primary HF: Dr [name]  Chief Complaint: HF follow up   History of Present Illness: [name] [name] is a 73 y.o. female with a history of CAD s/p CABG, diastolic CHF, and cerebrovascular disease with history of CVA presents for followup of CHF and diastolic CHF. She had CABG x 5 in 12/11. Prior to the CABG she had multiple PCIs. She had a CVA in 2/14 that presented as visual loss. She had a left carotid stent in 2/15. See extensive history in note from 05/15/18.   She was admitted 12/16-12/19/19 for cardiomems implant and RHC. She was diuresed with IV lasix and transitioned to torsemide 100 mg BID + metolazone once/week. DC weight: 295 lbs.  She had a prolonged hospitalization in 2/20 with acute respiratory failure and fever to 103.  She was thought to have PNA and treated with cefepime.  She was intubated.  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  Amiodarone was stopped and she was put on prednisone. Echo in 2/20 showed EF down to 25-30% with mid-apical LV severe hypokinesis. She had AKI. Possible ITP triggered by infection, HIT negative.  ITP was treated with Solumedrol. She was discharged to SNF.  She was sent home from SNF but fractured her ankle when she fell walking from car to house (mechanical).  S/p ORIF left ankle 05/16/18.  She had prolong a prolonged hospitalization with AMS and low grade temp 09/2018. CT of head negative. Later transferred to CIR. Unfortunately she was readmitted to acute due to hypotension and bradycardia. Briefly placed on dopamine and HTN meds stopped. Once stabilized she returned to CIR. HTN meds gradually restarted.   Today she returns for HF follow up.Overall feeling fine. Says she is getting  stronger everyday. Mild SOB with exertion. Denies PND/Orthopnea. Continues to walk with a walker. Appetite ok. Denies chest pain. No fever or chills. Weight at home 233 pounds. Taking all medications. Followed by [facility] for HHRN/HHPT.    Past Medical History:  Diagnosis Date  . Abnormality of gait 05/14/2014  . Anemia    hx  . Anginal pain ([place])   . Anxiety   . Asthma   . Basal cell carcinoma 05/2014   "left shoulder"  . Bundle branch block, left    chronic/notes 07/18/2013  . CHF (congestive heart failure) ([place])   . Chronic bronchitis ([place])    "off and on all the time" (07/18/2013)  . Chronic insomnia 05/06/2015  . Chronic kidney disease    frequency, sees dr Jamal Maes every 4 to 6 months (01/16/2018)  . Chronic low back pain 08/24/2016  . Chronic lower back pain   . Claustrophobia   . Common migraine 05/14/2014  . Coronary artery disease   . Depression   . Dysrhythmia   . GERD (gastroesophageal reflux disease)   . H/O hiatal hernia   . Headache    "at least 2/month" (01/16/2018)  . Heart murmur   . Hyperlipidemia   . Hypertension   . Irregular heart beat   . Leg cramps    both legs at times  . Melanoma ([place]) 05/2014   "burned off BLE" (01/16/2018)  . Memory change 05/14/2014  . Migraine    "5-6/year"  (01/16/2018)  .  Myocardial infarction ([place]) 04/1999, 02/2000, 01/2005; 2011; 2014  . Neuromuscular disorder ([place])    ?  . Obesity 01-2010  . Obstructive sleep apnea    "can't wear machine; I have claustrophobia" (01/16/2018), states she had a 2nd sleep study and does not have sleep apnea, her O2 decreases and now is on 2 L of O2 at night.  . On home oxygen therapy    "2L at night and prn during daytime" (01/16/2018)  . Osteoarthritis    "knees and hands" (01/16/2018)  . Other and unspecified angina pectoris   . Peripheral vascular disease (H[facility])    ? numbness, tingling arms and legs  . Pneumonia 2000's   "once"  . PONV (postoperative nausea and vomiting)   .  Shortness of breath    with exertion  . Stroke ([place]) 03/22/12   right side brain; denies residual on 07/18/2013)  . Stroke [facility]) Oct. 2015   Affected pt.s balance  . Stroke [facility]) 10/2014   "affected my legs; fully recovered now"; still have sporatic memory issues from this one" (01/16/2018)  . Type II diabetes mellitus ([place])   . Ventral hernia    hx of  . Vomiting    persistent  . Vomiting blood    Past Surgical History:  Procedure Laterality Date  . ANKLE FRACTURE SURGERY Left 1970's  . APPENDECTOMY  1970's   w/hysterectomy  . BASAL CELL CARCINOMA EXCISION Left 05/2014   "shoulder" (01/16/2018)  . CARDIAC CATHETERIZATION  10/10/2012   Dr [name].  Marland Kitchen CARDIAC CATHETERIZATION N/A 05/29/2015   Procedure: Right/Left Heart Cath and Coronary/Graft Angiography;  Surgeon: Larey Dresser, MD;  Location: [place] CV LAB;  Service: Cardiovascular;  Laterality: N/A;  . CAROTID ENDARTERECTOMY Left 03/2013  . CAROTID STENT INSERTION Left 03/20/2013   Procedure: CAROTID STENT INSERTION;  Surgeon: [name], MD;  Location: [facility] CATH LAB;  Service: Cardiovascular;  Laterality: Left;  internal carotid  . CEREBRAL ANGIOGRAM N/A 04/05/2011   Procedure: CEREBRAL ANGIOGRAM;  Surgeon: Angelia Mould, MD;  Location: [facility] CATH LAB;  Service: Cardiovascular;  Laterality: N/A;  . CHOLECYSTECTOMY OPEN  2004  . CORONARY ANGIOPLASTY WITH STENT PLACEMENT  01,02,05,06,07,08,11; 04/24/2013   "I've probably got ~ 10 stents by now" (04/24/2013)  . CORONARY ANGIOPLASTY WITH STENT PLACEMENT  06/13/2013   "got 4 stents today" (06/13/2013)  . CORONARY ARTERY BYPASS GRAFT  1220/11   "CABG X5"  . CORONARY STENT INTERVENTION N/A 10/20/2017   Procedure: CORONARY STENT INTERVENTION;  Surgeon: Troy Sine, MD;  Location: [place] CV LAB;  Service: Cardiovascular;  Laterality: N/A;  . ESOPHAGOGASTRODUODENOSCOPY  08/03/2011   Procedure: ESOPHAGOGASTRODUODENOSCOPY (EGD);  Surgeon: Shann Medal, MD;  Location: [facility];  Service: General;  Laterality: N/A;  . ESOPHAGOGASTRODUODENOSCOPY (EGD) WITH PROPOFOL N/A 03/11/2014   Procedure: ESOPHAGOGASTRODUODENOSCOPY (EGD) WITH PROPOFOL;  Surgeon: Lafayette Dragon, MD;  Location: [facility] ENDOSCOPY;  Service: Endoscopy;  Laterality: N/A;  . FRACTURE SURGERY    . gall stone removal  05/2003  . GASTRIC RESTRICTION SURGERY  1984   "stapeling"  . HERNIA REPAIR  2004   "in my stomach; had OR on it twice", wire mesh on 1 hernia  . LEFT HEART CATH AND CORS/GRAFTS ANGIOGRAPHY N/A 07/09/2016   Procedure: LEFT HEART CATH AND CORS/GRAFTS ANGIOGRAPHY;  Surgeon: Larey Dresser, MD;  Location: [place] CV LAB;  Service: Cardiovascular;  Laterality: N/A;  . ORIF ANKLE FRACTURE Left 05/16/2018  . ORIF ANKLE FRACTURE Left 05/16/2018  Procedure: OPEN REDUCTION INTERNAL FIXATION (ORIF) Left ankle with possible syndesmosis fixation;  Surgeon: Nicholes Stairs, MD;  Location: [facility];  Service: Orthopedics;  Laterality: Left;  149mn  . OVARY SURGERY  1970's   "tumor removed"  . PERCUTANEOUS CORONARY STENT INTERVENTION (PCI-S) N/A 06/13/2013   Procedure: PERCUTANEOUS CORONARY STENT INTERVENTION (PCI-S);  Surgeon: JJettie Booze MD;  [facility];  Service: Cardiovascular;  Laterality: N/A;  . PERCUTANEOUS STENT INTERVENTION N/A 04/24/2013   Procedure: PERCUTANEOUS STENT INTERVENTION;  Surgeon: JJettie Booze MD;  [facility];  Service: Cardiovascular;  Laterality: N/A;  . RIGHT/LEFT HEART CATH AND CORONARY/GRAFT ANGIOGRAPHY N/A 10/20/2017   Procedure: RIGHT/LEFT HEART CATH AND CORONARY/GRAFT ANGIOGRAPHY;  Surgeon: MLarey Dresser MD;  [facility];  Service: Cardiovascular;  Laterality: N/A;  . ROOT CANAL  10/2000  . TIBIA FRACTURE SURGERY Right 1970's   rods and pins  . TOOTH EXTRACTION     "1 on the upper; wisdom tooth on the lower" (01/16/2018)  . TOTAL ABDOMINAL HYSTERECTOMY  1970's   w/ appendectomy     Current Outpatient  Medications  Medication Sig Dispense Refill  . acetaminophen (TYLENOL) 500 MG tablet Take 1 tablet (500 mg total) by mouth every 6 (six) hours as needed for moderate pain or fever. 30 tablet 0  . ADVAIR HFA 115-21 MCG/ACT inhaler Inhale 2 puffs into the lungs.    .Marland Kitchenalbuterol (VENTOLIN HFA) 108 (90 Base) MCG/ACT inhaler Inhale 2 puffs into the lungs every 6 (six) hours as needed for wheezing.    . Alirocumab (PRALUENT) 150 MG/ML SOAJ Inject 1 pen into the skin every 14 (fourteen) days. 2 pen 11  . ALPRAZolam (XANAX) 0.5 MG tablet Take 1 tablet (0.5 mg total) by mouth 2 (two) times daily as needed for anxiety. 20 tablet 0  . amLODipine (NORVASC) 5 MG tablet Take 1 tablet (5 mg total) by mouth daily. 30 tablet 3  . apixaban (ELIQUIS) 5 MG TABS tablet Take 1 tablet (5 mg total) by mouth 2 (two) times daily. 60 tablet 6  . bisacodyl (DULCOLAX) 5 MG EC tablet Take 1 tablet (5 mg total) by mouth daily as needed for moderate constipation. 30 tablet 1  . budesonide (PULMICORT) 0.25 MG/2ML nebulizer solution Take 2 mLs (0.25 mg total) by nebulization 2 (two) times daily. 60 mL 12  . buPROPion (WELLBUTRIN XL) 150 MG 24 hr tablet Take 1 tablet (150 mg total) by mouth daily. 30 tablet 0  . clopidogrel (PLAVIX) 75 MG tablet Take 1 tablet (75 mg total) by mouth at bedtime. 90 tablet 1  . denosumab (PROLIA) 60 MG/ML SOSY injection Inject 69 mg into the skin every 6 (six) months.    . dextromethorphan-guaiFENesin (MUCINEX DM) 30-600 MG 12hr tablet Take 1 tablet by mouth 2 (two) times daily. 60 tablet 0  . DULoxetine (CYMBALTA) 30 MG capsule Take 1 capsule (30 mg total) by mouth daily. 30 capsule 0  . ezetimibe (ZETIA) 10 MG tablet Take 1 tablet (10 mg total) by mouth at bedtime.    . gabapentin (NEURONTIN) 300 MG capsule Take 1 capsule (300 mg total) by mouth daily. 30 capsule 1  . ipratropium-albuterol (DUONEB) 0.5-2.5 (3) MG/3ML SOLN Take 3 mLs by nebulization 2 (two) times daily. (Patient taking differently:  Take 3 mLs by nebulization 2 (two) times daily as needed (shortness of breath). ) 360 mL   . ketoconazole (NIZORAL) 2 % cream Apply 1 application topically daily. 15 g  0  . metoprolol succinate (TOPROL-XL) 50 MG 24 hr tablet Take 1 tablet (50 mg total) by mouth daily. Take with or immediately following a meal. 30 tablet 1  . Multiple Vitamins-Minerals (HEALTHY EYES/LUTEIN) TABS Take 1 tablet by mouth daily.    . nitroGLYCERIN (NITROSTAT) 0.3 MG SL tablet Place 1 tablet (0.3 mg total) under the tongue every 5 (five) minutes x 3 doses as needed for chest pain. 9 tablet 1  . OXYGEN Inhale 2 L into the lungs at bedtime.     . pantoprazole (PROTONIX) 40 MG tablet Take 1 tablet (40 mg total) by mouth daily. 30 tablet 0  . potassium chloride SA (K-DUR) 20 MEQ tablet TAKE ONE (1) TABLET BY MOUTH TWO (2) TIMES DAILY 60 tablet 1  . ranolazine (RANEXA) 500 MG 12 hr tablet Take 1 tablet (500 mg total) by mouth 2 (two) times daily. 60 tablet 1  . rosuvastatin (CRESTOR) 40 MG tablet Take 1 tablet (40 mg total) by mouth at bedtime. 30 tablet 0  . spironolactone (ALDACTONE) 25 MG tablet Take 1 tablet (25 mg total) by mouth daily. 30 tablet 0  . torsemide (DEMADEX) 20 MG tablet Take 1 tablet (20 mg total) by mouth daily. 30 tablet 0  . vitamin B-12 (CYANOCOBALAMIN) 1000 MCG tablet Take 1,000 mcg by mouth daily.     No current facility-administered medications for this encounter.     Allergies:   Amoxicillin, Brilinta [ticagrelor], Erythromycin, Flagyl [metronidazole], Penicillins, Isosorbide mononitrate [isosorbide nitrate], Jardiance [empagliflozin], Metformin and related, Tape, and Erythromycin base   Social History:  The patient  reports that she has never smoked. She has never used smokeless tobacco. She reports that she does not drink alcohol or use drugs.   Family History:  The patient's family history includes AAA (abdominal aortic aneurysm) in her father and mother; Cancer in her sister; Deep vein  thrombosis in her father and mother; Diabetes in her father, paternal aunt, paternal grandmother, paternal uncle, and paternal uncle; Heart attack in her father; Heart attack (age of onset: 75) in her paternal grandfather; Heart disease in her father and paternal uncle; Hyperlipidemia in her father; Hypertension in her father, mother, and sister.   ROS:  Please see the history of present illness.   All other systems are personally reviewed and negative.  Vitals:   10/17/18 0956  BP: 125/83  Pulse: (!) 57  SpO2: 94%   Wt Readings from Last 3 Encounters:  10/17/18 109.5 kg (241 lb 6.4 oz)  10/12/18 107.5 kg (237 lb)  09/28/18 105.7 kg (233 lb 0.4 oz)      Exam: General:   No resp difficulty. Appears chronically ill.  HEENT: normal Neck: supple. no JVD. Carotids 2+ bilat; no bruits. No lymphadenopathy or thryomegaly appreciated. Cor: PMI nondisplaced. Regular rate & rhythm. No rubs, gallops or murmurs. Lungs: clear Abdomen: obese, soft, nontender, nondistended. No hepatosplenomegaly. No bruits or masses. Good bowel sounds. Extremities: no cyanosis, clubbing, rash, edema Neuro: alert & orientedx3, cranial nerves grossly intact. moves all 4 extremities w/o difficulty. Affect pleasant  Recent Labs: 09/18/2018: B Natriuretic Peptide 226.7; TSH 1.571 09/20/2018: Magnesium 2.0 09/22/2018: ALT 32; Hemoglobin 12.4; Platelets 279 09/28/2018: BUN 24; Creatinine, Ser 1.72; Potassium 4.1; Sodium 141  Personally reviewed   Wt Readings from Last 3 Encounters:  10/17/18 109.5 kg (241 lb 6.4 oz)  10/12/18 107.5 kg (237 lb)  09/28/18 105.7 kg (233 lb 0.4 oz)      ASSESSMENT AND PLAN:  1. CAD: Occluded  native RCA and SVG-RCA. Status post opening of CTO RCA with 4 overlapping Xience DES in 5/15. DES to sequential SVG-OM1/PLOM in 9/19.  No chest pain, but her EF on echo done in 3/20 was down to 25-30%.   Not sure if this is due to CAD or is a stress (Takotsubo-type) cardiomyopathy in the setting of  severe pulmonary disease with intubation.  ECHO in August showed EF has improved 45-50%.  -No chest pain.  - Continue Plavix hopefully for a year (10/2018).  - She has not tolerated Imdur in the past.  Continue Toprol XL.  She is off ranolazine but has not had any chest pain.   2. Chronic diastolic => systolic CHF: Echo in 0/88 with EF 55-60%, moderate diastolic dysfunction.Cardiomems placed 01/16/18.  Echo in 3/20 in setting of severe PNA with intubation showed EF 25-30%, mid-apical LV severe hypokinesis.  Possible stress (Takotsubo-type) cardiomyopathy related to severe medical illness versus progression of CAD.  She has baseline chronic LBBB which could play a role in cardiomyopathy as well (LBBB cardiomyopathy).   - Most recent ECHO in August 2020 showed EF has improved 45-50%.  -NYHA III. Volume status stable. Continue torsemide to 20 mg daily. Check BMET today.  - Continue spironolactone 25 mg daily - Continue Toprol XL 50 mg daily.  Will hold off on transitioning to [facility] with falls/orthostasis and creatinine elevation. - Now out of range for ICD.  3. Hyperlipidemia: She remains on Zetia, Crestor and Praluent.Lipids mildly higher than goal in 9/19 with LDL 108, she is on maximal meds at this time.No change. 4. Hypertension - Stable.  5. Cerebrovascular disease: She has history of CVA and had left carotid stent in 2/15. Followed by [facility]. Most recent MRA head showed severe stenosis of supraclinoid RICA and severe basilar artery stenosis. On statin and plavic.  6. OHS/OSA: Cannot tolerate CPAP. Using oxygen as needed.  7. Atrial fibrillation: Paroxysmal.  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  - She has completed course of prednisone for amiodarone lung toxicity.   - Continue Apixiban 5 mg BID. Check BMET today  8. Ankle fracture: S/p ORIF on 05/16/18. 9. Falls - No recent falls.  10. CKD III-IV - baseline creatinine PTA seemed to be 1.4-1.6 -  Check BMET   Follow up in 6-8 weeks. Check CBC and BMET today.   Jeanmarie Hubert, NP  10/17/2018 10:03 AM   [facility] [address] [address] [phone] (office) [phone] (fax)

## 2018-10-17 NOTE — Patient Instructions (Signed)
Lab work done today. We will notify you of any abnormal lab work. No news is good news!  Please follow up with the [facility] in 6 weeks and again in 12 weeks.  At the [facility], you and your health needs are our priority. As part of our continuing mission to provide you with exceptional heart care, we have created designated Provider Care Teams. These Care Teams include your primary Cardiologist (physician) and Advanced Practice Providers (APPs- Physician Assistants and Nurse Practitioners) who all work together to provide you with the care you need, when you need it.   You may see any of the following providers on your designated Care Team at your next follow up: Marland Kitchen Dr Glori Bickers . Dr Loralie Champagne . Darrick Grinder, NP   Please be sure to bring in all your medications bottles to every appointment.

## 2018-10-20 ENCOUNTER — Telehealth (HOSPITAL_COMMUNITY): Payer: Self-pay | Admitting: *Deleted

## 2018-10-20 NOTE — Telephone Encounter (Signed)
Physical therapist with [facility] called to report pts BP 173/84, slight shortness of breath, little bit of dizziness and headache.  He wanted to know if any medication changes need to be made.  Routed to [place] for advice

## 2018-10-21 NOTE — Telephone Encounter (Signed)
Check with patient and see if BP is running high continually (can she check it at home?)

## 2018-10-23 NOTE — Telephone Encounter (Signed)
Left message for pt to call me back 

## 2018-11-07 ENCOUNTER — Other Ambulatory Visit: Payer: Self-pay

## 2018-11-07 DIAGNOSIS — I6523 Occlusion and stenosis of bilateral carotid arteries: Secondary | ICD-10-CM

## 2018-11-08 ENCOUNTER — Telehealth (HOSPITAL_COMMUNITY): Payer: Self-pay | Admitting: Cardiology

## 2018-11-08 MED ORDER — TORSEMIDE 20 MG PO TABS: ORAL_TABLET | ORAL | 4 refills | Status: DC

## 2018-11-08 NOTE — Telephone Encounter (Signed)
Patient called to report increase in weight and fluid. Reports weight is up 5lbs in 2 days. +SOB. Patient has been taking an extra tab of torsemide for the past two days with no relief.  Patient has not been able to use the cardiomems devise- advised to call tech support

## 2018-11-08 NOTE — Telephone Encounter (Signed)
Increase torsemide to 40 mg twice a day for 2 days then cut back to 40 mg daily.   Please call.   Keirra Zeimet NP-C  2:23 PM

## 2018-11-08 NOTE — Telephone Encounter (Signed)
Patient aware and voiced understanding

## 2018-11-08 NOTE — Addendum Note (Signed)
Addended by: Kerry Dory on: 11/08/2018 05:54 PM   Modules accepted: Orders

## 2018-11-13 ENCOUNTER — Ambulatory Visit (INDEPENDENT_AMBULATORY_CARE_PROVIDER_SITE_OTHER): Payer: Medicare Other | Admitting: Family

## 2018-11-13 ENCOUNTER — Other Ambulatory Visit (HOSPITAL_COMMUNITY): Payer: Self-pay

## 2018-11-13 ENCOUNTER — Encounter: Payer: Self-pay | Admitting: Family

## 2018-11-13 ENCOUNTER — Other Ambulatory Visit: Payer: Self-pay

## 2018-11-13 ENCOUNTER — Ambulatory Visit (HOSPITAL_COMMUNITY)
Admission: RE | Admit: 2018-11-13 | Discharge: 2018-11-13 | Disposition: A | Discharge status: Home / Self Care | Payer: Medicare Other | Source: Ambulatory Visit | Attending: Family | Admitting: Family

## 2018-11-13 VITALS — BP 132/70 | HR 72 | Temp 98.1°F | Resp 12 | Ht 62.0 in | Wt 241.0 lb

## 2018-11-13 DIAGNOSIS — Z8673 Personal history of transient ischemic attack (TIA), and cerebral infarction without residual deficits: Secondary | ICD-10-CM | POA: Diagnosis not present

## 2018-11-13 DIAGNOSIS — I6523 Occlusion and stenosis of bilateral carotid arteries: Secondary | ICD-10-CM

## 2018-11-13 DIAGNOSIS — Z959 Presence of cardiac and vascular implant and graft, unspecified: Secondary | ICD-10-CM | POA: Diagnosis not present

## 2018-11-13 MED ORDER — SPIRONOLACTONE 25 MG PO TABS: 25.0000 mg | ORAL_TABLET | Freq: Every day | ORAL | 3 refills | Status: DC

## 2018-11-13 NOTE — Progress Notes (Addendum)
Chief Complaint: Follow up Extracranial Carotid Artery Stenosis   History of Present Illness  [name] [name] is a 73 y.o. female who is s/p left carotid stent placement on 03/20/2013 with distal embolic protection by Dr. Trula Slade. This was done in the setting of a symptomatic left carotid stenosis. The patient was having nearly daily episodes of aphasia.  She returns today for follow up. Dr. Jannifer Franklin, her neurologist, requested a carotid Duplex that was performed on 09/27/13, copy of result reviewed which showedminimal bilateral ICA stenosis.   She is followed by Dr. Jannifer Franklin for a history of several TIA's; she has a history of vertigo, expressive aphasia, and photophobia. She states she is feeling better and walking better with a walker; states she has lost a great deal of fluid weight.   October 2015 strokeoccurredin cardiac rehab as manifested by ptcould no longer stand up, feeling dizzy; saw Dr. Jannifer Franklin that day, had an MRI, pt states Dr. Jannifer Franklin told her that she had 2 more strokes. She continues to have intermittent expressive aphasia.  Pt states Dr. Jannifer Franklin is not sure of the etiology of her stokes, but states she has two brain aneurysms.   July, 2015 she had what she thinks was a stroke as manifested by feeling hot, off balance, headache, ataxia.  Dr. Jannifer Franklin requested an MRI of her brain to evaluate this and pt states she was told that she had another stroke; she still feels off balance, occasional headaches, occasional crawling sensation on left side of face with exertion, occasional mild expressive aphasia.  She had another stroke in May or June of 2017 as manifested by dizziness and weakness in both legs.  She is scheduled to have another MRI of the brain on 05-20-16 requested by Dr. Jannifer Franklin, to evaluate more dizziness, bilateral legs weakness. February, 2014 she lost vision in both eyes, her vision has since improved with some loss of acuity. She denies hemiparesis.  She  has been seen by Dr. Estanislado Pandy for vertebral artery stenosis as well as intracranial aneurysm.   [facility] and ischemic CAD, 4 stents placed. Her cardiologist is Dr. [name]. More recently she was hospitalized with pneumonia and hypotension in August 2020.   She has a hx of bariatric surgery in the 1980's and has nausea and sometimes vomiting every morning on an empty stomach.  Diabetic: Yes, A1C was 7.0 on 09-10-18 (review of records) Dr. Buddy Duty is her endocrinologist.  Tobacco use: non-smoker  Pt meds include: Statin : Yes, also taking injectable Praluent for difficult to control cholesterol ASA: Yes, 2- 81 mg Other anticoagulants/antiplatelets: Eliquis, Plavix    Past Medical History:  Diagnosis Date  . Abnormality of gait 05/14/2014  . Anemia    hx  . Anginal pain ([place])   . Anxiety   . Asthma   . Basal cell carcinoma 05/2014   "left shoulder"  . Bundle branch block, left    chronic/notes 07/18/2013  . CHF (congestive heart failure) ([place])   . Chronic bronchitis ([place])    "off and on all the time" (07/18/2013)  . Chronic insomnia 05/06/2015  . Chronic kidney disease    frequency, sees dr Jamal Maes every 4 to 6 months (01/16/2018)  . Chronic low back pain 08/24/2016  . Chronic lower back pain   . Claustrophobia   . Common migraine 05/14/2014  . Coronary artery disease   . Depression   . Dysrhythmia   . GERD (gastroesophageal reflux disease)   .  H/O hiatal hernia   . Headache    "at least 2/month" (01/16/2018)  . Heart murmur   . Hyperlipidemia   . Hypertension   . Irregular heart beat   . Leg cramps    both legs at times  . Melanoma ([place]) 05/2014   "burned off BLE" (01/16/2018)  . Memory change 05/14/2014  . Migraine    "5-6/year"  (01/16/2018)  . Myocardial infarction ([place]) 04/1999, 02/2000, 01/2005; 2011; 2014  . Neuromuscular disorder ([place])    ?  . Obesity 01-2010  . Obstructive sleep apnea    "can't wear  machine; I have claustrophobia" (01/16/2018), states she had a 2nd sleep study and does not have sleep apnea, her O2 decreases and now is on 2 L of O2 at night.  . On home oxygen therapy    "2L at night and prn during daytime" (01/16/2018)  . Osteoarthritis    "knees and hands" (01/16/2018)  . Other and unspecified angina pectoris   . Peripheral vascular disease (H[facility])    ? numbness, tingling arms and legs  . Pneumonia 2000's   "once"  . PONV (postoperative nausea and vomiting)   . Shortness of breath    with exertion  . Stroke ([place]) 03/22/12   right side brain; denies residual on 07/18/2013)  . Stroke [facility]) Oct. 2015   Affected pt.s balance  . Stroke [facility]) 10/2014   "affected my legs; fully recovered now"; still have sporatic memory issues from this one" (01/16/2018)  . Type II diabetes mellitus ([place])   . Ventral hernia    hx of  . Vomiting    persistent  . Vomiting blood     Social History Social History   Tobacco Use  . Smoking status: Never Smoker  . Smokeless tobacco: Never Used  Substance Use Topics  . Alcohol use: Never    Frequency: Never  . Drug use: Never    Family History Family History  Problem Relation Age of Onset  . Heart disease Father        Heart Disease before age 20  . Diabetes Father   . Hyperlipidemia Father   . Hypertension Father   . Heart attack Father   . Deep vein thrombosis Father   . AAA (abdominal aortic aneurysm) Father   . Hypertension Mother   . Deep vein thrombosis Mother   . AAA (abdominal aortic aneurysm) Mother   . Cancer Sister        Ovarian  . Hypertension Sister   . Diabetes Paternal Grandmother   . Heart disease Paternal Uncle   . Diabetes Paternal Uncle   . Heart attack Paternal Grandfather 27       died of MI at 2  . Diabetes Paternal Aunt   . Diabetes Paternal Uncle   . Colon cancer Neg Hx     Surgical History Past Surgical History:  Procedure Laterality Date  . ANKLE FRACTURE SURGERY Left 1970's  .  APPENDECTOMY  1970's   w/hysterectomy  . BASAL CELL CARCINOMA EXCISION Left 05/2014   "shoulder" (01/16/2018)  . CARDIAC CATHETERIZATION  10/10/2012   Dr [name].  Marland Kitchen CARDIAC CATHETERIZATION N/A 05/29/2015   Procedure: Right/Left Heart Cath and Coronary/Graft Angiography;  Surgeon: Larey Dresser, MD;  Location: [facility];  Service: Cardiovascular;  Laterality: N/A;  . CAROTID ENDARTERECTOMY Left 03/2013  . CAROTID STENT INSERTION Left 03/20/2013   Procedure: CAROTID STENT INSERTION;  Surgeon: [name], MD;  Location: [facility] CATH LAB;  Service: Cardiovascular;  Laterality: Left;  internal carotid  . CEREBRAL ANGIOGRAM N/A 04/05/2011   Procedure: CEREBRAL ANGIOGRAM;  Surgeon: Angelia Mould, MD;  Location: [facility] CATH LAB;  Service: Cardiovascular;  Laterality: N/A;  . CHOLECYSTECTOMY OPEN  2004  . CORONARY ANGIOPLASTY WITH STENT PLACEMENT  01,02,05,06,07,08,11; 04/24/2013   "I've probably got ~ 10 stents by now" (04/24/2013)  . CORONARY ANGIOPLASTY WITH STENT PLACEMENT  06/13/2013   "got 4 stents today" (06/13/2013)  . CORONARY ARTERY BYPASS GRAFT  1220/11   "CABG X5"  . CORONARY STENT INTERVENTION N/A 10/20/2017   Procedure: CORONARY STENT INTERVENTION;  Surgeon: Troy Sine, MD;  Location: [place] [facility];  Service: Cardiovascular;  Laterality: N/A;  . ESOPHAGOGASTRODUODENOSCOPY  08/03/2011   Procedure: ESOPHAGOGASTRODUODENOSCOPY (EGD);  Surgeon: Shann Medal, MD;  Location: [facility];  Service: General;  Laterality: N/A;  . ESOPHAGOGASTRODUODENOSCOPY (EGD) WITH PROPOFOL N/A 03/11/2014   Procedure: ESOPHAGOGASTRODUODENOSCOPY (EGD) WITH PROPOFOL;  Surgeon: Lafayette Dragon, MD;  Location: [facility]OSCOPY;  Service: Endoscopy;  Laterality: N/A;  . FRACTURE SURGERY    . gall stone removal  05/2003  . GASTRIC RESTRICTION SURGERY  1984   "stapeling"  . HERNIA REPAIR  2004   "in my stomach; had OR on it twice", wire mesh on 1 hernia  . LEFT HEART CATH AND CORS/GRAFTS ANGIOGRAPHY N/A  07/09/2016   Procedure: LEFT HEART CATH AND CORS/GRAFTS ANGIOGRAPHY;  Surgeon: Larey Dresser, MD;  Location: [place] [facility];  Service: Cardiovascular;  Laterality: N/A;  . ORIF ANKLE FRACTURE Left 05/16/2018  . ORIF ANKLE FRACTURE Left 05/16/2018   Procedure: OPEN REDUCTION INTERNAL FIXATION (ORIF) Left ankle with possible syndesmosis fixation;  Surgeon: Nicholes Stairs, MD;  Location: [facility];  Service: Orthopedics;  Laterality: Left;  140min  . OVARY SURGERY  1970's   "tumor removed"  . PERCUTANEOUS CORONARY STENT INTERVENTION (PCI-S) N/A 06/13/2013   Procedure: PERCUTANEOUS CORONARY STENT INTERVENTION (PCI-S);  Surgeon: [name], MD;  Location: [facility] CATH LAB;  Service: Cardiovascular;  Laterality: N/A;  . PERCUTANEOUS STENT INTERVENTION N/A 04/24/2013   Procedure: PERCUTANEOUS STENT INTERVENTION;  Surgeon: [name], MD;  Location: [facility] CATH LAB;  Service: Cardiovascular;  Laterality: N/A;  . RIGHT/LEFT HEART CATH AND CORONARY/GRAFT ANGIOGRAPHY N/A 10/20/2017   Procedure: RIGHT/LEFT HEART CATH AND CORONARY/GRAFT ANGIOGRAPHY;  Surgeon: Larey Dresser, MD;  Location: [place] [facility];  Service: Cardiovascular;  Laterality: N/A;  . ROOT CANAL  10/2000  . TIBIA FRACTURE SURGERY Right 1970's   rods and pins  . TOOTH EXTRACTION     "1 on the upper; wisdom tooth on the lower" (01/16/2018)  . TOTAL ABDOMINAL HYSTERECTOMY  1970's   w/ appendectomy    Allergies  Allergen Reactions  . Amoxicillin Shortness Of Breath and Rash  . Brilinta [Ticagrelor] Shortness Of Breath  . Erythromycin Shortness Of Breath, Other (See Comments) and Hives    Trouble swallowing  . Flagyl [Metronidazole] Shortness Of Breath and Palpitations  . Penicillins Hives, Shortness Of Breath, Rash and Other (See Comments)    Has patient had a PCN reaction causing immediate rash, facial/tongue/throat swelling, SOB or lightheadedness with hypotension: Yes Has patient had a PCN reaction causing severe  rash involving mucus membranes or skin necrosis: No Has patient had a PCN reaction that required hospitalization: Yes Has patient had a PCN reaction occurring within the last 10 years: No If all of the above answers are "NO", then may proceed with Cephalosporin use.   Marland Kitchen  Isosorbide Mononitrate [Isosorbide Nitrate] Other (See Comments)    Joint aches, muscles hurt, difficult to walk   . Jardiance [Empagliflozin] Other (See Comments)    Nausea, joint aches, muscles aches  . Metformin And Related Other (See Comments)    Stomach pain, cold sweats, joint pain, burred vision, dizziness  . Tape Other (See Comments)    Skin pulls off with certain types Plastic tape causes skin to rip if left on for long periods of time  . Erythromycin Base Rash    Current Outpatient Medications  Medication Sig Dispense Refill  . acetaminophen (TYLENOL) 500 MG tablet Take 1 tablet (500 mg total) by mouth every 6 (six) hours as needed for moderate pain or fever. 30 tablet 0  . ADVAIR HFA 115-21 MCG/ACT inhaler Inhale 2 puffs into the lungs.    Marland Kitchen albuterol (VENTOLIN HFA) 108 (90 Base) MCG/ACT inhaler Inhale 2 puffs into the lungs every 6 (six) hours as needed for wheezing.    . Alirocumab (PRALUENT) 150 MG/ML SOAJ Inject 1 pen into the skin every 14 (fourteen) days. 2 pen 11  . ALPRAZolam (XANAX) 0.5 MG tablet Take 1 tablet (0.5 mg total) by mouth 2 (two) times daily as needed for anxiety. 20 tablet 0  . amLODipine (NORVASC) 5 MG tablet Take 1 tablet (5 mg total) by mouth daily. 30 tablet 3  . apixaban (ELIQUIS) 5 MG TABS tablet Take 1 tablet (5 mg total) by mouth 2 (two) times daily. 60 tablet 6  . bisacodyl (DULCOLAX) 5 MG EC tablet Take 1 tablet (5 mg total) by mouth daily as needed for moderate constipation. 30 tablet 1  . budesonide (PULMICORT) 0.25 MG/2ML nebulizer solution Take 2 mLs (0.25 mg total) by nebulization 2 (two) times daily. 60 mL 12  . buPROPion (WELLBUTRIN XL) 150 MG 24 hr tablet Take 1 tablet  (150 mg total) by mouth daily. 30 tablet 0  . clopidogrel (PLAVIX) 75 MG tablet Take 1 tablet (75 mg total) by mouth at bedtime. 90 tablet 1  . denosumab (PROLIA) 60 MG/ML SOSY injection Inject 69 mg into the skin every 6 (six) months.    . dextromethorphan-guaiFENesin (MUCINEX DM) 30-600 MG 12hr tablet Take 1 tablet by mouth 2 (two) times daily. 60 tablet 0  . DULoxetine (CYMBALTA) 30 MG capsule Take 1 capsule (30 mg total) by mouth daily. 30 capsule 0  . ezetimibe (ZETIA) 10 MG tablet Take 1 tablet (10 mg total) by mouth at bedtime.    . gabapentin (NEURONTIN) 300 MG capsule Take 1 capsule (300 mg total) by mouth daily. 30 capsule 1  . ipratropium-albuterol (DUONEB) 0.5-2.5 (3) MG/3ML SOLN Take 3 mLs by nebulization 2 (two) times daily. (Patient taking differently: Take 3 mLs by nebulization 2 (two) times daily as needed (shortness of breath). ) 360 mL   . ketoconazole (NIZORAL) 2 % cream Apply 1 application topically daily. 15 g 0  . metoprolol succinate (TOPROL-XL) 50 MG 24 hr tablet Take 1 tablet (50 mg total) by mouth daily. Take with or immediately following a meal. 30 tablet 1  . Multiple Vitamins-Minerals (HEALTHY EYES/LUTEIN) TABS Take 1 tablet by mouth daily.    . nitroGLYCERIN (NITROSTAT) 0.3 MG SL tablet Place 1 tablet (0.3 mg total) under the tongue every 5 (five) minutes x 3 doses as needed for chest pain. 9 tablet 1  . OXYGEN Inhale 2 L into the lungs at bedtime.     . pantoprazole (PROTONIX) 40 MG tablet Take 1 tablet (40 mg  total) by mouth daily. 30 tablet 0  . potassium chloride SA (K-DUR) 20 MEQ tablet TAKE ONE (1) TABLET BY MOUTH TWO (2) TIMES DAILY 60 tablet 1  . ranolazine (RANEXA) 500 MG 12 hr tablet Take 1 tablet (500 mg total) by mouth 2 (two) times daily. 60 tablet 1  . rosuvastatin (CRESTOR) 40 MG tablet Take 1 tablet (40 mg total) by mouth at bedtime. 30 tablet 0  . spironolactone (ALDACTONE) 25 MG tablet Take 1 tablet (25 mg total) by mouth daily. 30 tablet 3  .  torsemide (DEMADEX) 20 MG tablet Take 2 tablets (40 mg total) by mouth 2 (two) times daily for 2 days, THEN 2 tablets (40 mg total) daily. 64 tablet 4  . vitamin B-12 (CYANOCOBALAMIN) 1000 MCG tablet Take 1,000 mcg by mouth daily.     No current facility-administered medications for this visit.     Review of Systems : See HPI for pertinent positives and negatives.  Physical Examination  Vitals:   11/13/18 1234  BP: 132/70  Pulse: 72  Resp: 12  Temp: 98.1 F (36.7 C)  TempSrc: Temporal  SpO2: 97%  Weight: 241 lb (109.3 kg)  Height: 5\' 2"  (1.575 m)   Body mass index is 44.08 kg/m.  General: WDWN morbidly obese female in NAD GAIT: slow, steady, using walker Eyes: PERRLA HENT: No gross abnormalities.  Pulmonary:  Respirations are non-labored, fair air movement in all fields, few rales in left base, no rhonchi or wheezes.  Cardiac: regular rhythm, + murmur.  VASCULAR EXAM Carotid Bruits Right Left   Negative Negative     Abdominal aortic pulse is not palpable. Radial pulses are 1+ palpable and equal.                                                                                                                            LE Pulses Right Left       POPLITEAL  not palpable   not palpable       POSTERIOR TIBIAL   palpable    palpable        DORSALIS PEDIS      ANTERIOR TIBIAL  palpable  not palpable     Gastrointestinal: mild generalized tenderness to palpation, no palpable masses. Musculoskeletal: no muscle atrophy/wasting. M/S 4/5 throughout, extremities without ischemic changes.  1+ bilateral pretibial pitting edema.  Skin: No rashes, no ulcers, no cellulitis.   Neurologic:  A&O X 3; appropriate affect, sensation is normal; speech is normal, CN 2-12 intact, pain and light touch intact in extremities, motor exam as listed above. Psychiatric: Normal thought content, mood appropriate to clinical situation.    DATA  Carotid Duplex (11-13-18): Right Carotid:  Velocities in the right ICA are consistent with a 1-39% stenosis.                The ECA appears >50% stenosed. Left Carotid: Patent stent with no visualized stenosis. The ECA appears >50%  stenosed. Vertebrals:  Bilateral vertebral arteries demonstrate antegrade flow. Subclavians: Normal flow hemodynamics were seen in bilateral subclavian arteries. No significant change compared to the exams on 05-17-16 and 05-23-17.   Assessment: [name] [name] is a 73 y.o. female who is s/p left carotid stent placement on 03/20/2013 with distal embolic protection.  This was done in the setting of a symptomatic left carotid stenosis. The patient was having nearly daily episodes of aphasia. She has had several more strokes and TIA's. Pt states Dr. Jannifer Franklin is following her for the etiology of her stokes, states she has two brain aneurysms.  Today's carotid duplex shows 1-39% stenosis of the right ICA and no restenosis of the left ICA.   She indicates that she is more steady on her feet, is receiving home physical therapy.  She will ask her physical therapist to request an order for a rolling walker with a wide seat.  Looking back to 2015 lab results, no B12 level noted; consider checking this if not already checked, will defer to her PCP.   Her atherosclerotic risk factors include almost in control DM, morbid obesity, dyslipidemia, and CAD. Fortunately she has never used tobacco.    Plan: Follow-up in 1 year with Carotid Duplex scan   I discussed in depth with the patient the nature of atherosclerosis, and emphasized the importance of maximal medical management including strict control of blood pressure, blood glucose, and lipid levels, obtaining regular exercise, and continued cessation of smoking.  The patient is aware that without maximal medical management the underlying atherosclerotic disease process will progress, limiting the benefit of any interventions. The patient was given  information about stroke prevention and what symptoms should prompt the patient to seek immediate medical care. Thank you for allowing [place] to participate in this patient's care.  Clemon Chambers, RN, MSN, FNP-C [facility] Office: [phone]  Clinic Physician: Carlis Abbott on call  11/13/18 12:44 PM

## 2018-11-13 NOTE — Patient Instructions (Signed)
Stroke Prevention Some medical conditions and lifestyle choices can lead to a higher risk for a stroke. You can help to prevent a stroke by making nutrition, lifestyle, and other changes. What nutrition changes can be made?   Eat healthy foods. ? Choose foods that are high in fiber. These include:  Fresh fruits.  Fresh vegetables.  Whole grains. ? Eat at least 5 or more servings of fruits and vegetables each day. Try to fill half of your plate at each meal with fruits and vegetables. ? Choose lean protein foods. These include:  Lowfat (lean) cuts of meat.  Chicken without skin.  Fish.  Tofu.  Beans.  Nuts. ? Eat low-fat dairy products. ? Avoid foods that:  Are high in salt (sodium).  Have saturated fat.  Have trans fat.  Have cholesterol.  Are processed.  Are premade.  Follow eating guidelines as told by your doctor. These may include: ? Reducing how many calories you eat and drink each day. ? Limiting how much salt you eat or drink each day to 1,500 milligrams (mg). ? Using only healthy fats for cooking. These include:  Olive oil.  Canola oil.  Sunflower oil. ? Counting how many carbohydrates you eat and drink each day. What lifestyle changes can be made?  Try to stay at a healthy weight. Talk to your doctor about what a good weight is for you.  Get at least 30 minutes of moderate physical activity at least 5 days a week. This can include: ? Fast walking. ? Biking. ? Swimming.  Do not use any products that have nicotine or tobacco. This includes cigarettes and e-cigarettes. If you need help quitting, ask your doctor. Avoid being around tobacco smoke in general.  Limit how much alcohol you drink to no more than 1 drink a day for nonpregnant women and 2 drinks a day for men. One drink equals 12 oz of beer, 5 oz of wine, or 1 oz of hard liquor.  Do not use drugs.  Avoid taking birth control pills. Talk to your doctor about the risks of taking birth  control pills if: ? You are over 35 years old. ? You smoke. ? You get migraines. ? You have had a blood clot. What other changes can be made?  Manage your cholesterol. ? It is important to eat a healthy diet. ? If your cholesterol cannot be managed through your diet, you may also need to take medicines. Take medicines as told by your doctor.  Manage your diabetes. ? It is important to eat a healthy diet and to exercise regularly. ? If your blood sugar cannot be managed through diet and exercise, you may need to take medicines. Take medicines as told by your doctor.  Control your high blood pressure (hypertension). ? Try to keep your blood pressure below 130/80. This can help lower your risk of stroke. ? It is important to eat a healthy diet and to exercise regularly. ? If your blood pressure cannot be managed through diet and exercise, you may need to take medicines. Take medicines as told by your doctor. ? Ask your doctor if you should check your blood pressure at home. ? Have your blood pressure checked every year. Do this even if your blood pressure is normal.  Talk to your doctor about getting checked for a sleep disorder. Signs of this can include: ? Snoring a lot. ? Feeling very tired.  Take over-the-counter and prescription medicines only as told by your doctor. These may   include aspirin or blood thinners (antiplatelets or anticoagulants).  Make sure that any other medical conditions you have are managed. Where to find more information  American Stroke Association: www.strokeassociation.org  National Stroke Association: www.stroke.org Get help right away if:  You have any symptoms of stroke. "BE FAST" is an easy way to remember the main warning signs: ? B - Balance. Signs are dizziness, sudden trouble walking, or loss of balance. ? E - Eyes. Signs are trouble seeing or a sudden change in how you see. ? F - Face. Signs are sudden weakness or loss of feeling of the face,  or the face or eyelid drooping on one side. ? A - Arms. Signs are weakness or loss of feeling in an arm. This happens suddenly and usually on one side of the body. ? S - Speech. Signs are sudden trouble speaking, slurred speech, or trouble understanding what people say. ? T - Time. Time to call emergency services. Write down what time symptoms started.  You have other signs of stroke, such as: ? A sudden, very bad headache with no known cause. ? Feeling sick to your stomach (nausea). ? Throwing up (vomiting). ? Jerky movements you cannot control (seizure). These symptoms may represent a serious problem that is an emergency. Do not wait to see if the symptoms will go away. Get medical help right away. Call your local emergency services (911 in the [place] Do not drive yourself to the hospital. Summary  You can prevent a stroke by eating healthy, exercising, not smoking, drinking less alcohol, and treating other health problems, such as diabetes, high blood pressure, or high cholesterol.  Do not use any products that contain nicotine or tobacco, such as cigarettes and e-cigarettes.  Get help right away if you have any signs or symptoms of a stroke. This information is not intended to replace advice given to you by your health care provider. Make sure you discuss any questions you have with your health care provider. Document Released: 07/20/2011 Document Revised: 03/16/2018 Document Reviewed: 04/21/2016 Elsevier Patient Education  2020 [employer] Inc.  

## 2018-11-20 ENCOUNTER — Encounter: Payer: Medicare Other | Admitting: Physical Medicine & Rehabilitation

## 2018-11-21 ENCOUNTER — Telehealth (HOSPITAL_COMMUNITY): Payer: Self-pay | Admitting: *Deleted

## 2018-11-21 NOTE — Telephone Encounter (Signed)
RN w/[facility] called to report patient thinks she had a TIA on 10/15 I called back to get more information and no answer. I left VM requesting return call.

## 2018-11-24 ENCOUNTER — Telehealth (HOSPITAL_COMMUNITY): Payer: Self-pay

## 2018-11-24 MED ORDER — METOLAZONE 2.5 MG PO TABS: 2.5000 mg | ORAL_TABLET | Freq: Every day | ORAL | 0 refills | Status: DC

## 2018-11-24 NOTE — Telephone Encounter (Signed)
Received a call from [facility] physical therapy stating that patient has 5lb weight increase since last week.  Pt was 243lb last week and is now 248lb.  Pt reports some abdominal bloat and lower extremity swelling. Pt reports difficulty catch her breath. BP this morning was 164/68.  D/w Dr Haroldine Laws, pt should take Metolazone 2.5mg  (1 tab) daily for 2 days with extra 40 meq k with each dose. Pt verbalized understanding.  Pt due in office on 11/28/18.

## 2018-11-28 ENCOUNTER — Ambulatory Visit (HOSPITAL_COMMUNITY)
Admission: RE | Admit: 2018-11-28 | Discharge: 2018-11-28 | Disposition: A | Discharge status: Home / Self Care | Payer: Medicare Other | Source: Ambulatory Visit | Attending: Cardiology | Admitting: Cardiology

## 2018-11-28 ENCOUNTER — Other Ambulatory Visit: Payer: Self-pay

## 2018-11-28 ENCOUNTER — Encounter (HOSPITAL_COMMUNITY): Payer: Self-pay

## 2018-11-28 VITALS — BP 131/65 | HR 65 | Wt 228.2 lb

## 2018-11-28 DIAGNOSIS — S82892D Other fracture of left lower leg, subsequent encounter for closed fracture with routine healing: Secondary | ICD-10-CM | POA: Diagnosis not present

## 2018-11-28 DIAGNOSIS — M1710 Unilateral primary osteoarthritis, unspecified knee: Secondary | ICD-10-CM | POA: Diagnosis not present

## 2018-11-28 DIAGNOSIS — Z888 Allergy status to other drugs, medicaments and biological substances status: Secondary | ICD-10-CM | POA: Insufficient documentation

## 2018-11-28 DIAGNOSIS — J449 Chronic obstructive pulmonary disease, unspecified: Secondary | ICD-10-CM | POA: Diagnosis not present

## 2018-11-28 DIAGNOSIS — F419 Anxiety disorder, unspecified: Secondary | ICD-10-CM | POA: Insufficient documentation

## 2018-11-28 DIAGNOSIS — Z833 Family history of diabetes mellitus: Secondary | ICD-10-CM | POA: Insufficient documentation

## 2018-11-28 DIAGNOSIS — Z7902 Long term (current) use of antithrombotics/antiplatelets: Secondary | ICD-10-CM | POA: Diagnosis not present

## 2018-11-28 DIAGNOSIS — Z79899 Other long term (current) drug therapy: Secondary | ICD-10-CM | POA: Insufficient documentation

## 2018-11-28 DIAGNOSIS — Z809 Family history of malignant neoplasm, unspecified: Secondary | ICD-10-CM | POA: Insufficient documentation

## 2018-11-28 DIAGNOSIS — F329 Major depressive disorder, single episode, unspecified: Secondary | ICD-10-CM | POA: Insufficient documentation

## 2018-11-28 DIAGNOSIS — G8929 Other chronic pain: Secondary | ICD-10-CM | POA: Insufficient documentation

## 2018-11-28 DIAGNOSIS — E1122 Type 2 diabetes mellitus with diabetic chronic kidney disease: Secondary | ICD-10-CM | POA: Diagnosis not present

## 2018-11-28 DIAGNOSIS — I252 Old myocardial infarction: Secondary | ICD-10-CM | POA: Insufficient documentation

## 2018-11-28 DIAGNOSIS — Z7951 Long term (current) use of inhaled steroids: Secondary | ICD-10-CM | POA: Diagnosis not present

## 2018-11-28 DIAGNOSIS — I130 Hypertensive heart and chronic kidney disease with heart failure and stage 1 through stage 4 chronic kidney disease, or unspecified chronic kidney disease: Secondary | ICD-10-CM | POA: Diagnosis not present

## 2018-11-28 DIAGNOSIS — I5042 Chronic combined systolic (congestive) and diastolic (congestive) heart failure: Secondary | ICD-10-CM | POA: Diagnosis not present

## 2018-11-28 DIAGNOSIS — Z85820 Personal history of malignant melanoma of skin: Secondary | ICD-10-CM | POA: Insufficient documentation

## 2018-11-28 DIAGNOSIS — Z951 Presence of aortocoronary bypass graft: Secondary | ICD-10-CM | POA: Diagnosis not present

## 2018-11-28 DIAGNOSIS — Z9981 Dependence on supplemental oxygen: Secondary | ICD-10-CM | POA: Insufficient documentation

## 2018-11-28 DIAGNOSIS — I480 Paroxysmal atrial fibrillation: Secondary | ICD-10-CM | POA: Diagnosis not present

## 2018-11-28 DIAGNOSIS — Z7901 Long term (current) use of anticoagulants: Secondary | ICD-10-CM | POA: Insufficient documentation

## 2018-11-28 DIAGNOSIS — M19049 Primary osteoarthritis, unspecified hand: Secondary | ICD-10-CM | POA: Diagnosis not present

## 2018-11-28 DIAGNOSIS — Z955 Presence of coronary angioplasty implant and graft: Secondary | ICD-10-CM | POA: Diagnosis not present

## 2018-11-28 DIAGNOSIS — F40240 Claustrophobia: Secondary | ICD-10-CM | POA: Insufficient documentation

## 2018-11-28 DIAGNOSIS — Z8249 Family history of ischemic heart disease and other diseases of the circulatory system: Secondary | ICD-10-CM | POA: Insufficient documentation

## 2018-11-28 DIAGNOSIS — Z881 Allergy status to other antibiotic agents status: Secondary | ICD-10-CM | POA: Insufficient documentation

## 2018-11-28 DIAGNOSIS — E785 Hyperlipidemia, unspecified: Secondary | ICD-10-CM | POA: Insufficient documentation

## 2018-11-28 DIAGNOSIS — K219 Gastro-esophageal reflux disease without esophagitis: Secondary | ICD-10-CM | POA: Insufficient documentation

## 2018-11-28 DIAGNOSIS — I2510 Atherosclerotic heart disease of native coronary artery without angina pectoris: Secondary | ICD-10-CM | POA: Diagnosis present

## 2018-11-28 DIAGNOSIS — Z880 Allergy status to penicillin: Secondary | ICD-10-CM | POA: Insufficient documentation

## 2018-11-28 DIAGNOSIS — I25810 Atherosclerosis of coronary artery bypass graft(s) without angina pectoris: Secondary | ICD-10-CM | POA: Insufficient documentation

## 2018-11-28 DIAGNOSIS — I447 Left bundle-branch block, unspecified: Secondary | ICD-10-CM | POA: Insufficient documentation

## 2018-11-28 DIAGNOSIS — G4733 Obstructive sleep apnea (adult) (pediatric): Secondary | ICD-10-CM | POA: Insufficient documentation

## 2018-11-28 DIAGNOSIS — Z9049 Acquired absence of other specified parts of digestive tract: Secondary | ICD-10-CM | POA: Diagnosis not present

## 2018-11-28 DIAGNOSIS — Z8673 Personal history of transient ischemic attack (TIA), and cerebral infarction without residual deficits: Secondary | ICD-10-CM | POA: Diagnosis not present

## 2018-11-28 DIAGNOSIS — N184 Chronic kidney disease, stage 4 (severe): Secondary | ICD-10-CM | POA: Insufficient documentation

## 2018-11-28 DIAGNOSIS — Z8349 Family history of other endocrine, nutritional and metabolic diseases: Secondary | ICD-10-CM | POA: Insufficient documentation

## 2018-11-28 LAB — BASIC METABOLIC PANEL
Anion gap: 14 (ref 5–15)
BUN: 21 mg/dL (ref 8–23)
CO2: 32 mmol/L (ref 22–32)
Calcium: 9.2 mg/dL (ref 8.9–10.3)
Chloride: 89 mmol/L — ABNORMAL LOW (ref 98–111)
Creatinine, Ser: 1.66 mg/dL — ABNORMAL HIGH (ref 0.44–1.00)
GFR calc Af Amer: 36 mL/min — ABNORMAL LOW (ref >60)
GFR calc non Af Amer: 31 mL/min — ABNORMAL LOW (ref >60)
Glucose, Bld: 173 mg/dL — ABNORMAL HIGH (ref 70–99)
Potassium: 3.5 mmol/L (ref 3.5–5.1)
Sodium: 135 mmol/L (ref 135–145)

## 2018-11-28 NOTE — Progress Notes (Signed)
ID:  [name] [name], DOB [birth year], [name] [id number]  Location: Home  Provider location: [address], [place] Type of Visit: Established patient   PCP:  Derinda Late, MD  Cardiologist:  Larae Grooms, MD Primary HF: Dr [name]  Chief Complaint: HF follow up   History of Present Illness: [name] [name] is a 73 y.o. female with a history of CAD s/p CABG, diastolic CHF, and cerebrovascular disease with history of CVA presents for followup of CHF and diastolic CHF. She had CABG x 5 in 12/11. Prior to the CABG she had multiple PCIs. She had a CVA in 2/14 that presented as visual loss. She had a left carotid stent in 2/15. See extensive history in note from 05/15/18.   She was admitted 12/16-12/19/19 for cardiomems implant and RHC. She was diuresed with IV lasix and transitioned to torsemide 100 mg BID + metolazone once/week. DC weight: 295 lbs.  She had a prolonged hospitalization in 2/20 with acute respiratory failure and fever to 103.  She was thought to have PNA and treated with cefepime.  She was intubated.  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  Amiodarone was stopped and she was put on prednisone. Echo in 2/20 showed EF down to 25-30% with mid-apical LV severe hypokinesis. She had AKI. Possible ITP triggered by infection, HIT negative.  ITP was treated with Solumedrol. She was discharged to SNF.  She was sent home from SNF but fractured her ankle when she fell walking from car to house (mechanical).  S/p ORIF left ankle 05/16/18.  She had prolong a prolonged hospitalization with AMS and low grade temp 09/2018. CT of head negative. Later transferred to CIR. Unfortunately she was readmitted to acute due to hypotension and bradycardia. Briefly placed on dopamine and HTN meds stopped. Once stabilized she returned to CIR. HTN meds gradually restarted. She has since returned home and is followed by [facility] for HHRN/HHPT.   Not  compliant w/ cardiomems monitoring. Last reading was in Feb of this year.   We recently received call from [facility] physical therapy stating that patient had a 5lb weight increase over the span of 1 week.  Wt increased from 243lb to 248lb.  Pt also reported some abdominal bloat and lower extremity swelling. She was instructed to take Metolazone 2.15m (1 tab) daily for 2 days with extra 40 meq k with each dose. She presents to clinic for f/u.   She notes significant improvement and significant weight loss. Office wt 228 lb (lowest documented weight in several months). BP 131/65. Has stable chronic exertional dyspnea at baseline but no resting dyspnea.    Past Medical History:  Diagnosis Date  . Abnormality of gait 05/14/2014  . Anemia    hx  . Anginal pain ([facility]   . Anxiety   . Asthma   . Basal cell carcinoma 05/2014   "left shoulder"  . Bundle branch block, left    chronic/notes 07/18/2013  . CHF (congestive heart failure) ([facility]   . Chronic bronchitis ([facility] [place]    "off and on all the time" (07/18/2013)  . Chronic insomnia 05/06/2015  . Chronic kidney disease    frequency, sees dr cJamal Maesevery 4 to 6 months (01/16/2018)  . Chronic low back pain 08/24/2016  . Chronic lower back pain   . Claustrophobia   . Common migraine 05/14/2014  . Coronary artery disease   . Depression   . Dysrhythmia   .  GERD (gastroesophageal reflux disease)   . H/O hiatal hernia   . Headache    "at least 2/month" (01/16/2018)  . Heart murmur   . Hyperlipidemia   . Hypertension   . Irregular heart beat   . Leg cramps    both legs at times  . Melanoma ([place]) 05/2014   "burned off BLE" (01/16/2018)  . Memory change 05/14/2014  . Migraine    "5-6/year"  (01/16/2018)  . Myocardial infarction ([place]) 04/1999, 02/2000, 01/2005; 2011; 2014  . Neuromuscular disorder ([place])    ?  . Obesity 01-2010  . Obstructive sleep apnea    "can't wear machine; I have claustrophobia" (01/16/2018), states she had a 2nd sleep  study and does not have sleep apnea, her O2 decreases and now is on 2 L of O2 at night.  . On home oxygen therapy    "2L at night and prn during daytime" (01/16/2018)  . Osteoarthritis    "knees and hands" (01/16/2018)  . Other and unspecified angina pectoris   . Peripheral vascular disease (HCC)    ? numbness, tingling arms and legs  . Pneumonia 2000's   "once"  . PONV (postoperative nausea and vomiting)   . Shortness of breath    with exertion  . Stroke ([place]) 03/22/12   right side brain; denies residual on 07/18/2013)  . Stroke [facility]) Oct. 2015   Affected pt.s balance  . Stroke [facility]) 10/2014   "affected my legs; fully recovered now"; still have sporatic memory issues from this one" (01/16/2018)  . Type II diabetes mellitus ([place])   . Ventral hernia    hx of  . Vomiting    persistent  . Vomiting blood    Past Surgical History:  Procedure Laterality Date  . ANKLE FRACTURE SURGERY Left 1970's  . APPENDECTOMY  1970's   w/hysterectomy  . BASAL CELL CARCINOMA EXCISION Left 05/2014   "shoulder" (01/16/2018)  . CARDIAC CATHETERIZATION  10/10/2012   Dr [name].  Marland Kitchen CARDIAC CATHETERIZATION N/A 05/29/2015   Procedure: Right/Left Heart Cath and Coronary/Graft Angiography;  Surgeon: Larey Dresser, MD;  Location: [place] CV LAB;  Service: Cardiovascular;  Laterality: N/A;  . CAROTID ENDARTERECTOMY Left 03/2013  . CAROTID STENT INSERTION Left 03/20/2013   Procedure: CAROTID STENT INSERTION;  Surgeon: [name], MD;  Location: [facility] CATH LAB;  Service: Cardiovascular;  Laterality: Left;  internal carotid  . CEREBRAL ANGIOGRAM N/A 04/05/2011   Procedure: CEREBRAL ANGIOGRAM;  Surgeon: Angelia Mould, MD;  Location: [facility] CATH LAB;  Service: Cardiovascular;  Laterality: N/A;  . CHOLECYSTECTOMY OPEN  2004  . CORONARY ANGIOPLASTY WITH STENT PLACEMENT  01,02,05,06,07,08,11; 04/24/2013   "I've probably got ~ 10 stents by now" (04/24/2013)  . CORONARY ANGIOPLASTY WITH STENT PLACEMENT  06/13/2013    "got 4 stents today" (06/13/2013)  . CORONARY ARTERY BYPASS GRAFT  1220/11   "CABG X5"  . CORONARY STENT INTERVENTION N/A 10/20/2017   Procedure: CORONARY STENT INTERVENTION;  Surgeon: Troy Sine, MD;  Location: [place] CV LAB;  Service: Cardiovascular;  Laterality: N/A;  . ESOPHAGOGASTRODUODENOSCOPY  08/03/2011   Procedure: ESOPHAGOGASTRODUODENOSCOPY (EGD);  Surgeon: Shann Medal, MD;  Location[facility];  Service: General;  Laterality: N/A;  . ESOPHAGOGASTRODUODENOSCOPY (EGD) WITH PROPOFOL N/A 03/11/2014   Procedure: ESOPHAGOGASTRODUODENOSCOPY (EGD) WITH PROPOFOL;  Surgeon: Lafayette Dragon, MD;  Location: [facility] ENDOSCOPY;  Service: Endoscopy;  Laterality: N/A;  . FRACTURE SURGERY    . gall stone removal  05/2003  . GASTRIC RESTRICTION SURGERY  1984   "stapeling"  . HERNIA REPAIR  2004   "in my stomach; had OR on it twice", wire mesh on 1 hernia  . LEFT HEART CATH AND CORS/GRAFTS ANGIOGRAPHY N/A 07/09/2016   Procedure: LEFT HEART CATH AND CORS/GRAFTS ANGIOGRAPHY;  Surgeon: Larey Dresser, MD;  Location: [place] CV LAB;  Service: Cardiovascular;  Laterality: N/A;  . ORIF ANKLE FRACTURE Left 05/16/2018  . ORIF ANKLE FRACTURE Left 05/16/2018   Procedure: OPEN REDUCTION INTERNAL FIXATION (ORIF) Left ankle with possible syndesmosis fixation;  Surgeon: Nicholes Stairs, MD;  Location: [place];  Service: Orthopedics;  Laterality: Left;  169mn  . OVARY SURGERY  1970's   "tumor removed"  . PERCUTANEOUS CORONARY STENT INTERVENTION (PCI-S) N/A 06/13/2013   Procedure: PERCUTANEOUS CORONARY STENT INTERVENTION (PCI-S);  Surgeon: JJettie Booze MD;  [facility];  Service: Cardiovascular;  Laterality: N/A;  . PERCUTANEOUS STENT INTERVENTION N/A 04/24/2013   Procedure: PERCUTANEOUS STENT INTERVENTION;  Surgeon: JJettie Booze MD;  [facility];  Service: Cardiovascular;  Laterality: N/A;  . RIGHT/LEFT HEART CATH AND CORONARY/GRAFT ANGIOGRAPHY N/A 10/20/2017    Procedure: RIGHT/LEFT HEART CATH AND CORONARY/GRAFT ANGIOGRAPHY;  Surgeon: MLarey Dresser MD;  [facility];  Service: Cardiovascular;  Laterality: N/A;  . ROOT CANAL  10/2000  . TIBIA FRACTURE SURGERY Right 1970's   rods and pins  . TOOTH EXTRACTION     "1 on the upper; wisdom tooth on the lower" (01/16/2018)  . TOTAL ABDOMINAL HYSTERECTOMY  1970's   w/ appendectomy     Current Outpatient Medications  Medication Sig Dispense Refill  . acetaminophen (TYLENOL) 500 MG tablet Take 1 tablet (500 mg total) by mouth every 6 (six) hours as needed for moderate pain or fever. 30 tablet 0  . ADVAIR HFA 115-21 MCG/ACT inhaler Inhale 2 puffs into the lungs.    .Marland Kitchenalbuterol (VENTOLIN HFA) 108 (90 Base) MCG/ACT inhaler Inhale 2 puffs into the lungs every 6 (six) hours as needed for wheezing.    . Alirocumab (PRALUENT) 150 MG/ML SOAJ Inject 1 pen into the skin every 14 (fourteen) days. 2 pen 11  . ALPRAZolam (XANAX) 0.5 MG tablet Take 1 tablet (0.5 mg total) by mouth 2 (two) times daily as needed for anxiety. 20 tablet 0  . amLODipine (NORVASC) 5 MG tablet Take 1 tablet (5 mg total) by mouth daily. 30 tablet 3  . apixaban (ELIQUIS) 5 MG TABS tablet Take 1 tablet (5 mg total) by mouth 2 (two) times daily. 60 tablet 6  . bisacodyl (DULCOLAX) 5 MG EC tablet Take 1 tablet (5 mg total) by mouth daily as needed for moderate constipation. 30 tablet 1  . budesonide (PULMICORT) 0.25 MG/2ML nebulizer solution Take 2 mLs (0.25 mg total) by nebulization 2 (two) times daily. 60 mL 12  . buPROPion (WELLBUTRIN XL) 150 MG 24 hr tablet Take 1 tablet (150 mg total) by mouth daily. 30 tablet 0  . clopidogrel (PLAVIX) 75 MG tablet Take 1 tablet (75 mg total) by mouth at bedtime. 90 tablet 1  . denosumab (PROLIA) 60 MG/ML SOSY injection Inject 69 mg into the skin every 6 (six) months.    . dextromethorphan-guaiFENesin (MUCINEX DM) 30-600 MG 12hr tablet Take 1 tablet by mouth 2 (two) times daily. 60 tablet 0   . DULoxetine (CYMBALTA) 30 MG capsule Take 1 capsule (30 mg total) by mouth daily. 30 capsule 0  . ezetimibe (ZETIA) 10 MG tablet Take 1 tablet (10 mg  total) by mouth at bedtime.    . gabapentin (NEURONTIN) 300 MG capsule Take 1 capsule (300 mg total) by mouth daily. 30 capsule 1  . ipratropium-albuterol (DUONEB) 0.5-2.5 (3) MG/3ML SOLN Take 3 mLs by nebulization 2 (two) times daily. (Patient taking differently: Take 3 mLs by nebulization 2 (two) times daily as needed (shortness of breath). ) 360 mL   . ketoconazole (NIZORAL) 2 % cream Apply 1 application topically daily. 15 g 0  . metolazone (ZAROXOLYN) 2.5 MG tablet Take 1 tablet (2.5 mg total) by mouth daily. As directed by heart failure clinic. Call heart failure office for direction 5 tablet 0  . metoprolol succinate (TOPROL-XL) 50 MG 24 hr tablet Take 1 tablet (50 mg total) by mouth daily. Take with or immediately following a meal. 30 tablet 1  . Multiple Vitamins-Minerals (HEALTHY EYES/LUTEIN) TABS Take 1 tablet by mouth daily.    . nitroGLYCERIN (NITROSTAT) 0.3 MG SL tablet Place 1 tablet (0.3 mg total) under the tongue every 5 (five) minutes x 3 doses as needed for chest pain. 9 tablet 1  . OXYGEN Inhale 2 L into the lungs at bedtime.     . pantoprazole (PROTONIX) 40 MG tablet Take 1 tablet (40 mg total) by mouth daily. 30 tablet 0  . potassium chloride SA (K-DUR) 20 MEQ tablet TAKE ONE (1) TABLET BY MOUTH TWO (2) TIMES DAILY 60 tablet 1  . ranolazine (RANEXA) 500 MG 12 hr tablet Take 1 tablet (500 mg total) by mouth 2 (two) times daily. 60 tablet 1  . rosuvastatin (CRESTOR) 40 MG tablet Take 1 tablet (40 mg total) by mouth at bedtime. 30 tablet 0  . spironolactone (ALDACTONE) 25 MG tablet Take 1 tablet (25 mg total) by mouth daily. 30 tablet 3  . torsemide (DEMADEX) 20 MG tablet Take 2 tablets (40 mg total) by mouth 2 (two) times daily for 2 days, THEN 2 tablets (40 mg total) daily. 64 tablet 4  . vitamin B-12 (CYANOCOBALAMIN) 1000 MCG  tablet Take 1,000 mcg by mouth daily.     No current facility-administered medications for this encounter.     Allergies:   Amoxicillin, Brilinta [ticagrelor], Erythromycin, Flagyl [metronidazole], Penicillins, Isosorbide mononitrate [isosorbide nitrate], Jardiance [empagliflozin], Metformin and related, Tape, and Erythromycin base   Social History:  The patient  reports that she has never smoked. She has never used smokeless tobacco. She reports that she does not drink alcohol or use drugs.   Family History:  The patient's family history includes AAA (abdominal aortic aneurysm) in her father and mother; Cancer in her sister; Deep vein thrombosis in her father and mother; Diabetes in her father, paternal aunt, paternal grandmother, paternal uncle, and paternal uncle; Heart attack in her father; Heart attack (age of onset: 25) in her paternal grandfather; Heart disease in her father and paternal uncle; Hyperlipidemia in her father; Hypertension in her father, mother, and sister.   ROS:  Please see the history of present illness.   All other systems are personally reviewed and negative.  There were no vitals filed for this visit. Wt Readings from Last 3 Encounters:  11/28/18 103.5 kg (228 lb 3.2 oz)  11/13/18 109.3 kg (241 lb)  10/17/18 109.5 kg (241 lb 6.4 oz)      Exam: PHYSICAL EXAM: General:  Morbidly obese elderly WF.  No respiratory difficulty HEENT: normal Neck: supple. no JVD. Carotids 2+ bilat; no bruits. No lymphadenopathy or thyromegaly appreciated. Cor: PMI nondisplaced. Regular rate & rhythm. No rubs, gallops  or murmurs. Lungs: clear Abdomen: soft, nontender, nondistended. No hepatosplenomegaly. No bruits or masses. Good bowel sounds. Extremities: no cyanosis, clubbing, rash, edema Neuro: alert & oriented x 3, cranial nerves grossly intact. moves all 4 extremities w/o difficulty. Affect pleasant.   Recent Labs: 09/18/2018: B Natriuretic Peptide 226.7; TSH 1.571 09/20/2018:  Magnesium 2.0 09/22/2018: ALT 32 10/17/2018: BUN 18; Creatinine, Ser 1.45; Hemoglobin 10.6; Platelets 175; Potassium 4.5; Sodium 140  Personally reviewed   Wt Readings from Last 3 Encounters:  11/28/18 103.5 kg (228 lb 3.2 oz)  11/13/18 109.3 kg (241 lb)  10/17/18 109.5 kg (241 lb 6.4 oz)      ASSESSMENT AND PLAN:  1. CAD: Occluded native RCA and SVG-RCA. Status post opening of CTO RCA with 4 overlapping Xience DES in 5/15. DES to sequential SVG-OM1/PLOM in 9/19.  No chest pain, but her EF on echo done in 3/20 was down to 25-30%.   Not sure if this is due to CAD or is a stress (Takotsubo-type) cardiomyopathy in the setting of severe pulmonary disease with intubation.  ECHO in August showed EF has improved 45-50%.  -Stable w/o chest pain.  -Continue Plavix. No ASA due to Eliqius. Check CBC today.   -She has not tolerated Imdur in the past.  - Continue Toprol XL.   2. Chronic diastolic => systolic CHF: Echo in 8/46 with EF 55-60%, moderate diastolic dysfunction.Cardiomems placed 01/16/18 but hasn't been compliant w/ pillow. Last reading was in Feb.  Echo in 3/20 in setting of severe PNA with intubation showed EF 25-30%, mid-apical LV severe hypokinesis.  Possible stress (Takotsubo-type) cardiomyopathy related to severe medical illness versus progression of CAD.  She has baseline chronic LBBB which could play a role in cardiomyopathy as well (LBBB cardiomyopathy).   - Most recent ECHO in August 2020 showed EF has improved 45-50%.  -Stable NYHA III. Required recent metolazone x 2 doses for fluid overload, w/ excelent response. Wt now below previous dry wt. Appears euvolemic on exam.  Continue torsemide to 40 mg daily. Check BMET today. No more metolazone for now. May need to use PRN. HHRN to continue to monitor weight closely. Pt also encouraged to improve compliance w/ cardiomems.  - Continue spironolactone 25 mg daily - Continue Toprol XL 50 mg daily.  - Will hold off on transitioning to  [facility] with falls/orthostasis and creatinine elevation. - Now out of range for ICD.   3. Hyperlipidemia: She remains on Zetia, Crestor and Praluent.Lipids mildly higher than goal in 9/19 with LDL 108, she is on maximal meds at this time.No change.  4. Hypertension: Stable. Continue meds per above.   5. Cerebrovascular disease: She has history of CVA and had left carotid stent in 2/15. Followed by [facility]. Most recent MRA head showed severe stenosis of supraclinoid RICA and severe basilar artery stenosis. On statin and plavic.   6. OHS/OSA: Cannot tolerate CPAP. Using oxygen as needed.   7. Atrial fibrillation: Paroxysmal.  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  - She has completed course of prednisone for amiodarone lung toxicity.   - Continue Apixiban 5 mg BID. Check CBC and BMET today   8. Ankle fracture: S/p ORIF on 05/16/18. Continues w/ HHPT   10. CKD III-IV - Check BMET   Follow up in 6-8 weeks. Check CBC and BMET today.   Signed, Lyda Jester, PA-C  11/28/2018 11:46 AM   [facility] [address] [place] and [place] [address] (  [phone] (office) [phone] (fax)

## 2018-11-28 NOTE — Patient Instructions (Signed)
It was great to see you today! No medication changes are needed at this time.  Labs today We will only contact you if something comes back abnormal or we need to make some changes. Otherwise no news is good news!  Your physician recommends that you schedule a follow-up appointment in: 4-6 weeks  in the Advanced Practitioners (PA/NP) Clinic   Do the following things EVERYDAY: 1) Weigh yourself in the morning before breakfast. Write it down and keep it in a log. 2) Take your medicines as prescribed 3) Eat low salt foods-Limit salt (sodium) to 2000 mg per day.  4) Stay as active as you can everyday 5) Limit all fluids for the day to less than 2 liters   At the [facility], you and your health needs are our priority. As part of our continuing mission to provide you with exceptional heart care, we have created designated Provider Care Teams. These Care Teams include your primary Cardiologist (physician) and Advanced Practice Providers (APPs- Physician Assistants and Nurse Practitioners) who all work together to provide you with the care you need, when you need it.   You may see any of the following providers on your designated Care Team at your next follow up: Marland Kitchen Dr Glori Bickers . Dr Loralie Champagne . Darrick Grinder, NP . Lyda Jester, PA   Please be sure to bring in all your medications bottles to every appointment.

## 2018-12-01 ENCOUNTER — Encounter: Payer: Medicare Other | Admitting: Physical Medicine & Rehabilitation

## 2018-12-08 ENCOUNTER — Encounter: Payer: Medicare Other | Attending: Registered Nurse | Admitting: Physical Medicine & Rehabilitation

## 2018-12-08 ENCOUNTER — Other Ambulatory Visit: Payer: Self-pay

## 2018-12-08 ENCOUNTER — Encounter: Payer: Self-pay | Admitting: Physical Medicine & Rehabilitation

## 2018-12-08 VITALS — BP 130/81 | HR 60 | Temp 97.0°F | Ht 62.5 in | Wt 233.0 lb

## 2018-12-08 DIAGNOSIS — I6523 Occlusion and stenosis of bilateral carotid arteries: Secondary | ICD-10-CM

## 2018-12-08 DIAGNOSIS — R269 Unspecified abnormalities of gait and mobility: Secondary | ICD-10-CM | POA: Diagnosis not present

## 2018-12-08 DIAGNOSIS — B49 Unspecified mycosis: Secondary | ICD-10-CM | POA: Insufficient documentation

## 2018-12-08 DIAGNOSIS — G8929 Other chronic pain: Secondary | ICD-10-CM | POA: Diagnosis present

## 2018-12-08 DIAGNOSIS — I10 Essential (primary) hypertension: Secondary | ICD-10-CM | POA: Diagnosis present

## 2018-12-08 DIAGNOSIS — Z794 Long term (current) use of insulin: Secondary | ICD-10-CM | POA: Insufficient documentation

## 2018-12-08 DIAGNOSIS — R5381 Other malaise: Secondary | ICD-10-CM | POA: Insufficient documentation

## 2018-12-08 DIAGNOSIS — E1140 Type 2 diabetes mellitus with diabetic neuropathy, unspecified: Secondary | ICD-10-CM | POA: Diagnosis present

## 2018-12-08 DIAGNOSIS — F418 Other specified anxiety disorders: Secondary | ICD-10-CM | POA: Diagnosis present

## 2018-12-08 DIAGNOSIS — M545 Low back pain: Secondary | ICD-10-CM | POA: Diagnosis present

## 2018-12-08 DIAGNOSIS — I4891 Unspecified atrial fibrillation: Secondary | ICD-10-CM | POA: Diagnosis present

## 2018-12-08 DIAGNOSIS — I69398 Other sequelae of cerebral infarction: Secondary | ICD-10-CM

## 2018-12-08 DIAGNOSIS — I5022 Chronic systolic (congestive) heart failure: Secondary | ICD-10-CM | POA: Insufficient documentation

## 2018-12-08 NOTE — Progress Notes (Signed)
Subjective:   73 y.o. right-handed female with history of hypertension, chronic back pain maintained on oxycodone in the past, diabetes mellitus, hyperlipidemia, asthma, chronic diastolic congestive heart failure with ejection fraction 25%, CVA, CAD with CABG, atrial fibrillation maintained on Eliquis and Plavix followed by cardiology services.  Lives with spouse using rolling walker for mobility reports of multiple falls.  Husband and daughter assist as needed.  Patient had been at a skilled facility to end of July for bouts of pneumonia.  Presented 09/09/2018 with transient slurred speech dizziness vomiting as well as falls x3 without loss of consciousness.  Noted bruises to left upper back and left leg.  WBC 14,500, creatinine 1.13, lactic acid 1.2, COVID negative, urinalysis negative, potassium 3.3, low-grade fever 100.9.  Chest x-ray showed cardiomegaly vascular congestion mild pulmonary edema as well as developing bronchial pneumonia right lower lobe.  MRSA PCR screening positive.  X-rays of left femur left rib negative for fracture.  CT of the head as well as MRI and MRA showed no acute findings.  Severe stenosis left PCA P1 and P2 segments unchanged from compared to 06/01/2014.  Severe stenosis of both internal carotid arteries at the skull base progressed compared to 06/01/2014.  Neurology follow-up to address MRI suspect subacute to chronic right frontal lobe infarction new from prior scan of February 2020.  It was advised to continue Eliquis and Plavix.  Patient maintained on vancomycin and cefepime for fevers leukocytosis improved changed to Levaquin 812 20203 doses.  She did receive low-dose diuresis monitoring for signs of hypoxia with noted CO2 up to 42.  Tolerating a regular diet.  She was admitted to inpatient rehab services 09/14/2018 with slow but progressive gains.  On 8/16-8/17/2020 patient noted to be hypotensive with systolics in the 63K and diastolics in the 16W.  Cardiology had been  consulted 09/16/2018 Demadex was held placed on IV fluids noted elevation in creatinine from 1.68-3.14.  Initially remained on Norvasc decrease to 5 mg as well as losartan and Toprol.  A follow-up echocardiogram showed ejection fraction 50% mildly reduced systolic function.  Patient received a total of 1 L IV fluids continue to be hypotensive in addition to bradycardia heart rate 30s to 40s as well as leukocytosis she was discharged to acute care services for ongoing monitoring.  Placed on a dopamine drip initially hypotensive suspect secondary to volume depletion from overdiuresis and diuretics remained on hold.  Cranial CT scan completed due to some questionable increasing slurred speech that was unremarkable.  Heart failure team continue to follow with medication changes as directed low-dose Demadex later initiated.  Creatinine rebounded nicely 1.83 and monitoring of hypokalemia.  She remained on Eliquis and Plavix for atrial fibrillation with plan documented by cardiology services the Plavix could be discontinued 10/22/2018.  Patient ID: [name] [name], female    DOB: [birth year], 73 y.o.   [name]: [id number] Admit date: 09/21/2018 Discharge date: 09/28/2018  HPI Pt states she had another "mini stroke"  Facial droop on left that lasted a few minutes Takes up to 4 torsemide a day, was placed on some mannitol by cardiology as well.  Had some fluid overload a couple weeks ago managed as outpt Has home health therapy on hold due to labile BPs Still has home health nsg  Has questions about what can be done to prevent further stroke.  Uses a sitting eliptical up to 46min per day Is modified independent using a walker for ambulation Mod I ADL except for sup with shower  transfers Husband does cooking Daughter and husband provide 24/7 supervision Pain Inventory Average Pain 1 Pain Right Now 1 My pain is sharp, burning and tingling  In the last 24 hours, has pain interfered with the following? General  activity 0 Relation with others 0 Enjoyment of life 0 What TIME of day is your pain at its worst? . Sleep (in general) Fair  Pain is worse with: walking, sitting and standing Pain improves with: rest, heat/ice and medication Relief from Meds: .  Mobility use a walker ability to climb steps?  no do you drive?  no  Function retired I need assistance with the following:  meal prep, household duties and shopping  Neuro/Psych weakness tremor trouble walking dizziness depression anxiety  Prior Studies Any changes since last visit?  no  Physicians involved in your care Any changes since last visit?  no   Family History  Problem Relation Age of Onset   Heart disease Father        Heart Disease before age 57   Diabetes Father    Hyperlipidemia Father    Hypertension Father    Heart attack Father    Deep vein thrombosis Father    AAA (abdominal aortic aneurysm) Father    Hypertension Mother    Deep vein thrombosis Mother    AAA (abdominal aortic aneurysm) Mother    Cancer Sister        Ovarian   Hypertension Sister    Diabetes Paternal Grandmother    Heart disease Paternal Uncle    Diabetes Paternal Uncle    Heart attack Paternal Grandfather 21       died of MI at 89   Diabetes Paternal Aunt    Diabetes Paternal Uncle    Colon cancer Neg Hx    Social History   Socioeconomic History   Marital status: Married    Spouse name: [name]    Number of children: 1   Years of education: 12+   Highest education level: Not on file  Occupational History   Occupation: retired  Scientist, product/process development strain: Not on file   Food insecurity    Worry: Not on file    Inability: Not on Lexicographer needs    Medical: Not on file    Non-medical: Not on file  Tobacco Use   Smoking status: Never Smoker   Smokeless tobacco: Never Used  Substance and Sexual Activity   Alcohol use: Never    Frequency: Never   Drug use:  Never   Sexual activity: Not Currently    Birth control/protection: Surgical    Comment: hysterectomy  Lifestyle   Physical activity    Days per week: Not on file    Minutes per session: Not on file   Stress: Not on file  Relationships   Social connections    Talks on phone: Not on file    Gets together: Not on file    Attends religious service: Not on file    Active member of club or organization: Not on file    Attends meetings of clubs or organizations: Not on file    Relationship status: Not on file  Other Topics Concern   Not on file  Social History Narrative   Patient lives at home with husband [name].    Patient has 1 child.    Patient is not currently working.    Patient has 2 years of college.    Patient is right handed.  Patient drinks caffeine occasionally.   Past Surgical History:  Procedure Laterality Date   ANKLE FRACTURE SURGERY Left 1970's   APPENDECTOMY  1970's   w/hysterectomy   BASAL CELL CARCINOMA EXCISION Left 05/2014   "shoulder" (01/16/2018)   CARDIAC CATHETERIZATION  10/10/2012   Dr [name].   CARDIAC CATHETERIZATION N/A 05/29/2015   Procedure: Right/Left Heart Cath and Coronary/Graft Angiography;  Surgeon: Larey Dresser, MD;  Location: [facility];  Service: Cardiovascular;  Laterality: N/A;   CAROTID ENDARTERECTOMY Left 03/2013   CAROTID STENT INSERTION Left 03/20/2013   Procedure: CAROTID STENT INSERTION;  Surgeon: [name], MD;  Location: [facility] CATH LAB;  Service: Cardiovascular;  Laterality: Left;  internal carotid   CEREBRAL ANGIOGRAM N/A 04/05/2011   Procedure: CEREBRAL ANGIOGRAM;  Surgeon: Angelia Mould, MD;  Location: [facility] CATH LAB;  Service: Cardiovascular;  Laterality: N/A;   CHOLECYSTECTOMY OPEN  2004   CORONARY ANGIOPLASTY WITH STENT PLACEMENT  01,02,05,06,07,08,11; 04/24/2013   "I've probably got ~ 10 stents by now" (04/24/2013)   CORONARY ANGIOPLASTY WITH STENT PLACEMENT  06/13/2013   "got 4 stents today"  (06/13/2013)   CORONARY ARTERY BYPASS GRAFT  1220/11   "CABG X5"   CORONARY STENT INTERVENTION N/A 10/20/2017   Procedure: CORONARY STENT INTERVENTION;  Surgeon: Troy Sine, MD;  Location: [facility];  Service: Cardiovascular;  Laterality: N/A;   ESOPHAGOGASTRODUODENOSCOPY  08/03/2011   Procedure: ESOPHAGOGASTRODUODENOSCOPY (EGD);  Surgeon: Shann Medal, MD;  Location: [facility];  Service: General;  Laterality: N/A;   ESOPHAGOGASTRODUODENOSCOPY (EGD) WITH PROPOFOL N/A 03/11/2014   Procedure: ESOPHAGOGASTRODUODENOSCOPY (EGD) WITH PROPOFOL;  Surgeon: Lafayette Dragon, MD;  Location: [facility];  Service: Endoscopy;  Laterality: N/A;   FRACTURE SURGERY     gall stone removal  05/2003   GASTRIC RESTRICTION SURGERY  1984   "stapeling"   HERNIA REPAIR  2004   "in my stomach; had OR on it twice", wire mesh on 1 hernia   LEFT HEART CATH AND CORS/GRAFTS ANGIOGRAPHY N/A 07/09/2016   Procedure: LEFT HEART CATH AND CORS/GRAFTS ANGIOGRAPHY;  Surgeon: Larey Dresser, MD;  Location: [facility];  Service: Cardiovascular;  Laterality: N/A;   ORIF ANKLE FRACTURE Left 05/16/2018   ORIF ANKLE FRACTURE Left 05/16/2018   Procedure: OPEN REDUCTION INTERNAL FIXATION (ORIF) Left ankle with possible syndesmosis fixation;  Surgeon: Nicholes Stairs, MD;  Location: [facility];  Service: Orthopedics;  Laterality: Left;  126min   OVARY SURGERY  1970's   "tumor removed"   PERCUTANEOUS CORONARY STENT INTERVENTION (PCI-S) N/A 06/13/2013   Procedure: PERCUTANEOUS CORONARY STENT INTERVENTION (PCI-S);  Surgeon: [name], MD;  Location: [facility] CATH LAB;  Service: Cardiovascular;  Laterality: N/A;   PERCUTANEOUS STENT INTERVENTION N/A 04/24/2013   Procedure: PERCUTANEOUS STENT INTERVENTION;  Surgeon: [name], MD;  Location: [facility] CATH LAB;  Service: Cardiovascular;  Laterality: N/A;   RIGHT/LEFT HEART CATH AND CORONARY/GRAFT ANGIOGRAPHY N/A 10/20/2017   Procedure: RIGHT/LEFT HEART  CATH AND CORONARY/GRAFT ANGIOGRAPHY;  Surgeon: Larey Dresser, MD;  Location: [facility];  Service: Cardiovascular;  Laterality: N/A;   ROOT CANAL  10/2000   TIBIA FRACTURE SURGERY Right 1970's   rods and pins   TOOTH EXTRACTION     "1 on the upper; wisdom tooth on the lower" (01/16/2018)   TOTAL ABDOMINAL HYSTERECTOMY  1970's   w/ appendectomy   Past Medical History:  Diagnosis Date   Abnormality of gait 05/14/2014   Anemia  hx   Anginal pain ([place])    Anxiety    Asthma    Basal cell carcinoma 05/2014   "left shoulder"   Bundle branch block, left    chronic/notes 07/18/2013   CHF (congestive heart failure) ([place])    Chronic bronchitis ([place])    "off and on all the time" (07/18/2013)   Chronic insomnia 05/06/2015   Chronic kidney disease    frequency, sees dr Jamal Maes every 4 to 6 months (01/16/2018)   Chronic low back pain 08/24/2016   Chronic lower back pain    Claustrophobia    Common migraine 05/14/2014   Coronary artery disease    Depression    Dysrhythmia    GERD (gastroesophageal reflux disease)    H/O hiatal hernia    Headache    "at least 2/month" (01/16/2018)   Heart murmur    Hyperlipidemia    Hypertension    Irregular heart beat    Leg cramps    both legs at times   Melanoma ([place]) 05/2014   "burned off BLE" (01/16/2018)   Memory change 05/14/2014   Migraine    "5-6/year"  (01/16/2018)   Myocardial infarction ([place]) 04/1999, 02/2000, 01/2005; 2011; 2014   Neuromuscular disorder ([place])    ?   Obesity 01-2010   Obstructive sleep apnea    "can't wear machine; I have claustrophobia" (01/16/2018), states she had a 2nd sleep study and does not have sleep apnea, her O2 decreases and now is on 2 L of O2 at night.   On home oxygen therapy    "2L at night and prn during daytime" (01/16/2018)   Osteoarthritis    "knees and hands" (01/16/2018)   Other and unspecified angina pectoris    Peripheral vascular disease  ([place])    ? numbness, tingling arms and legs   Pneumonia 2000's   "once"   PONV (postoperative nausea and vomiting)    Shortness of breath    with exertion   Stroke ([place]) 03/22/12   right side brain; denies residual on 07/18/2013)   Stroke [facility]) Oct. 2015   Affected pt.s balance   Stroke ([place]) 10/2014   "affected my legs; fully recovered now"; still have sporatic memory issues from this one" (01/16/2018)   Type II diabetes mellitus (HCC)    Ventral hernia    hx of   Vomiting    persistent   Vomiting blood    BP 130/81    Pulse 60    Temp (!) 97 F (36.1 C)    Ht 5' 2.5" (1.588 m)    Wt 233 lb (105.7 kg)    SpO2 97%    BMI 41.94 kg/m   Opioid Risk Score:   Fall Risk Score:  `1  Depression screen PHQ 2/9  Depression screen [facility] 2/9 10/12/2018 10/28/2016 09/29/2016  Decreased Interest 2 0 0  Down, Depressed, Hopeless 1 - 0  PHQ - 2 Score 3 0 0  Altered sleeping 1 - -  Tired, decreased energy 1 - -  Change in appetite 0 - -  Feeling bad or failure about yourself  1 - -  Trouble concentrating 1 - -  Moving slowly or fidgety/restless 0 - -  Suicidal thoughts 0 - -  PHQ-9 Score 7 - -  Difficult doing work/chores Somewhat difficult - -  Some recent data might be hidden     Review of Systems  Constitutional: Positive for unexpected weight change.  HENT: Negative.   Eyes: Negative.  Respiratory: Positive for cough and shortness of breath.   Cardiovascular: Negative.   Gastrointestinal: Positive for constipation and nausea.  Endocrine: Negative.   Genitourinary: Negative.   Musculoskeletal: Positive for gait problem.  Allergic/Immunologic: Negative.   Neurological: Positive for dizziness, tremors and weakness.  Hematological: Negative.   Psychiatric/Behavioral: Positive for dysphoric mood. The patient is nervous/anxious.   All other systems reviewed and are negative.      Objective:   Physical Exam Heart bradycardic with regular rhythm no extra sounds or  murmurs Lungs clear to auscultation on the left, no evidence of rales bilaterally, there are occasional wheezes on the right side. Extremities show 1+ pretibial edema bilaterally.  There is no upper extremity edema Motor strength is 4/5 bilateral deltoid bicep tricep grip hip flexor knee extensor ankle dorsiflexor.       Assessment & Plan:  1.  Hx of recurrent CVA in setting of hosptialization for CHF with multiple medical complications, she currently does not exhibit any stroke deficits.  She was asking about secondary stroke prevention.  I did discuss the DASH diet.  Also she plans to discuss other potential stroke prophylactic measures with her neurologist.  I do not think she needs any further physical medicine rehab follow-up.  She will start back in her physical therapy once her blood pressure stabilizes.  She will follow-up with cardiology for her chronic CHF

## 2018-12-08 NOTE — Patient Instructions (Signed)
DASH Eating Plan °DASH stands for "Dietary Approaches to Stop Hypertension." The DASH eating plan is a healthy eating plan that has been shown to reduce high blood pressure (hypertension). It may also reduce your risk for type 2 diabetes, heart disease, and stroke. The DASH eating plan may also help with weight loss. °What are tips for following this plan? ° °General guidelines °· Avoid eating more than 2,300 mg (milligrams) of salt (sodium) a day. If you have hypertension, you may need to reduce your sodium intake to 1,500 mg a day. °· Limit alcohol intake to no more than 1 drink a day for nonpregnant women and 2 drinks a day for men. One drink equals 12 oz of beer, 5 oz of wine, or 1½ oz of hard liquor. °· Work with your health care provider to maintain a healthy body weight or to lose weight. Ask what an ideal weight is for you. °· Get at least 30 minutes of exercise that causes your heart to beat faster (aerobic exercise) most days of the week. Activities may include walking, swimming, or biking. °· Work with your health care provider or diet and nutrition specialist (dietitian) to adjust your eating plan to your individual calorie needs. °Reading food labels ° °· Check food labels for the amount of sodium per serving. Choose foods with less than 5 percent of the Daily Value of sodium. Generally, foods with less than 300 mg of sodium per serving fit into this eating plan. °· To find whole grains, look for the word "whole" as the first word in the ingredient list. °Shopping °· Buy products labeled as "low-sodium" or "no salt added." °· Buy fresh foods. Avoid canned foods and premade or frozen meals. °Cooking °· Avoid adding salt when cooking. Use salt-free seasonings or herbs instead of table salt or sea salt. Check with your health care provider or pharmacist before using salt substitutes. °· Do not fry foods. Cook foods using healthy methods such as baking, boiling, grilling, and broiling instead. °· Cook with  heart-healthy oils, such as olive, canola, soybean, or sunflower oil. °Meal planning °· Eat a balanced diet that includes: °? 5 or more servings of fruits and vegetables each day. At each meal, try to fill half of your plate with fruits and vegetables. °? Up to 6-8 servings of whole grains each day. °? Less than 6 oz of lean meat, poultry, or fish each day. A 3-oz serving of meat is about the same size as a deck of cards. One egg equals 1 oz. °? 2 servings of low-fat dairy each day. °? A serving of nuts, seeds, or beans 5 times each week. °? Heart-healthy fats. Healthy fats called Omega-3 fatty acids are found in foods such as flaxseeds and coldwater fish, like sardines, salmon, and mackerel. °· Limit how much you eat of the following: °? Canned or prepackaged foods. °? Food that is high in trans fat, such as fried foods. °? Food that is high in saturated fat, such as fatty meat. °? Sweets, desserts, sugary drinks, and other foods with added sugar. °? Full-fat dairy products. °· Do not salt foods before eating. °· Try to eat at least 2 vegetarian meals each week. °· Eat more home-cooked food and less restaurant, buffet, and fast food. °· When eating at a restaurant, ask that your food be prepared with less salt or no salt, if possible. °What foods are recommended? °The items listed may not be a complete list. Talk with your dietitian about   what dietary choices are best for you. °Grains °Whole-grain or whole-wheat bread. Whole-grain or whole-wheat pasta. Brown rice. Oatmeal. Quinoa. Bulgur. Whole-grain and low-sodium cereals. Pita bread. Low-fat, low-sodium crackers. Whole-wheat flour tortillas. °Vegetables °Fresh or frozen vegetables (raw, steamed, roasted, or grilled). Low-sodium or reduced-sodium tomato and vegetable juice. Low-sodium or reduced-sodium tomato sauce and tomato paste. Low-sodium or reduced-sodium canned vegetables. °Fruits °All fresh, dried, or frozen fruit. Canned fruit in natural juice (without  added sugar). °Meat and other protein foods °Skinless chicken or turkey. Ground chicken or turkey. Pork with fat trimmed off. Fish and seafood. Egg whites. Dried beans, peas, or lentils. Unsalted nuts, nut butters, and seeds. Unsalted canned beans. Lean cuts of beef with fat trimmed off. Low-sodium, lean deli meat. °Dairy °Low-fat (1%) or fat-free (skim) milk. Fat-free, low-fat, or reduced-fat cheeses. Nonfat, low-sodium ricotta or cottage cheese. Low-fat or nonfat yogurt. Low-fat, low-sodium cheese. °Fats and oils °Soft margarine without trans fats. Vegetable oil. Low-fat, reduced-fat, or light mayonnaise and salad dressings (reduced-sodium). Canola, safflower, olive, soybean, and sunflower oils. Avocado. °Seasoning and other foods °Herbs. Spices. Seasoning mixes without salt. Unsalted popcorn and pretzels. Fat-free sweets. °What foods are not recommended? °The items listed may not be a complete list. Talk with your dietitian about what dietary choices are best for you. °Grains °Baked goods made with fat, such as croissants, muffins, or some breads. Dry pasta or rice meal packs. °Vegetables °Creamed or fried vegetables. Vegetables in a cheese sauce. Regular canned vegetables (not low-sodium or reduced-sodium). Regular canned tomato sauce and paste (not low-sodium or reduced-sodium). Regular tomato and vegetable juice (not low-sodium or reduced-sodium). Pickles. Olives. °Fruits °Canned fruit in a light or heavy syrup. Fried fruit. Fruit in cream or butter sauce. °Meat and other protein foods °Fatty cuts of meat. Ribs. Fried meat. Bacon. Sausage. Bologna and other processed lunch meats. Salami. Fatback. Hotdogs. Bratwurst. Salted nuts and seeds. Canned beans with added salt. Canned or smoked fish. Whole eggs or egg yolks. Chicken or turkey with skin. °Dairy °Whole or 2% milk, cream, and half-and-half. Whole or full-fat cream cheese. Whole-fat or sweetened yogurt. Full-fat cheese. Nondairy creamers. Whipped toppings.  Processed cheese and cheese spreads. °Fats and oils °Butter. Stick margarine. Lard. Shortening. Ghee. Bacon fat. Tropical oils, such as coconut, palm kernel, or palm oil. °Seasoning and other foods °Salted popcorn and pretzels. Onion salt, garlic salt, seasoned salt, table salt, and sea salt. Worcestershire sauce. Tartar sauce. Barbecue sauce. Teriyaki sauce. Soy sauce, including reduced-sodium. Steak sauce. Canned and packaged gravies. Fish sauce. Oyster sauce. Cocktail sauce. Horseradish that you find on the shelf. Ketchup. Mustard. Meat flavorings and tenderizers. Bouillon cubes. Hot sauce and Tabasco sauce. Premade or packaged marinades. Premade or packaged taco seasonings. Relishes. Regular salad dressings. °Where to find more information: °· National Heart, Lung, and Blood Institute: www.nhlbi.nih.gov °· American Heart Association: www.heart.org °Summary °· The DASH eating plan is a healthy eating plan that has been shown to reduce high blood pressure (hypertension). It may also reduce your risk for type 2 diabetes, heart disease, and stroke. °· With the DASH eating plan, you should limit salt (sodium) intake to 2,300 mg a day. If you have hypertension, you may need to reduce your sodium intake to 1,500 mg a day. °· When on the DASH eating plan, aim to eat more fresh fruits and vegetables, whole grains, lean proteins, low-fat dairy, and heart-healthy fats. °· Work with your health care provider or diet and nutrition specialist (dietitian) to adjust your eating plan to your   individual calorie needs. °This information is not intended to replace advice given to you by your health care provider. Make sure you discuss any questions you have with your health care provider. °Document Released: 01/07/2011 Document Revised: 12/31/2016 Document Reviewed: 01/12/2016 °Elsevier Patient Education © 2020 [employer]. ° °

## 2018-12-22 ENCOUNTER — Telehealth: Payer: Self-pay | Admitting: Neurology

## 2018-12-22 NOTE — Telephone Encounter (Signed)
Dr. Deboraha Sprang office called to see who is the neurohospitalist at [facility]. Since I am not there I don't know, explained that we are outpatient and they have to call the hospital. Patient is having weakness and slurred speech. I advised they send her to the hospital via 911 and the ED can contact whoever is on call for neurology. In addition, I informed them that patient no-showed her last appointment and had no-showed twice to our office. thanks

## 2018-12-23 ENCOUNTER — Inpatient Hospital Stay (HOSPITAL_COMMUNITY)
Admission: EM | Admit: 2018-12-23 | Discharge: 2018-12-26 | DRG: 069 | Disposition: A | Discharge status: Home Health | Payer: Medicare Other | Attending: Internal Medicine | Admitting: Internal Medicine

## 2018-12-23 ENCOUNTER — Other Ambulatory Visit: Payer: Self-pay

## 2018-12-23 ENCOUNTER — Encounter (HOSPITAL_COMMUNITY): Payer: Self-pay | Admitting: Emergency Medicine

## 2018-12-23 ENCOUNTER — Observation Stay (HOSPITAL_COMMUNITY): Payer: Medicare Other

## 2018-12-23 ENCOUNTER — Other Ambulatory Visit (HOSPITAL_COMMUNITY): Payer: Self-pay | Admitting: Cardiology

## 2018-12-23 DIAGNOSIS — R569 Unspecified convulsions: Secondary | ICD-10-CM | POA: Diagnosis not present

## 2018-12-23 DIAGNOSIS — I10 Essential (primary) hypertension: Secondary | ICD-10-CM | POA: Diagnosis not present

## 2018-12-23 DIAGNOSIS — R443 Hallucinations, unspecified: Secondary | ICD-10-CM | POA: Diagnosis present

## 2018-12-23 DIAGNOSIS — Z8349 Family history of other endocrine, nutritional and metabolic diseases: Secondary | ICD-10-CM

## 2018-12-23 DIAGNOSIS — I252 Old myocardial infarction: Secondary | ICD-10-CM

## 2018-12-23 DIAGNOSIS — I447 Left bundle-branch block, unspecified: Secondary | ICD-10-CM | POA: Diagnosis present

## 2018-12-23 DIAGNOSIS — R4781 Slurred speech: Secondary | ICD-10-CM | POA: Diagnosis present

## 2018-12-23 DIAGNOSIS — R29818 Other symptoms and signs involving the nervous system: Secondary | ICD-10-CM | POA: Diagnosis present

## 2018-12-23 DIAGNOSIS — Z6841 Body Mass Index (BMI) 40.0 and over, adult: Secondary | ICD-10-CM

## 2018-12-23 DIAGNOSIS — I2510 Atherosclerotic heart disease of native coronary artery without angina pectoris: Secondary | ICD-10-CM | POA: Diagnosis present

## 2018-12-23 DIAGNOSIS — I672 Cerebral atherosclerosis: Secondary | ICD-10-CM | POA: Diagnosis present

## 2018-12-23 DIAGNOSIS — Z833 Family history of diabetes mellitus: Secondary | ICD-10-CM

## 2018-12-23 DIAGNOSIS — J449 Chronic obstructive pulmonary disease, unspecified: Secondary | ICD-10-CM | POA: Diagnosis present

## 2018-12-23 DIAGNOSIS — Z20828 Contact with and (suspected) exposure to other viral communicable diseases: Secondary | ICD-10-CM | POA: Diagnosis present

## 2018-12-23 DIAGNOSIS — Z880 Allergy status to penicillin: Secondary | ICD-10-CM

## 2018-12-23 DIAGNOSIS — N183 Chronic kidney disease, stage 3 unspecified: Secondary | ICD-10-CM | POA: Diagnosis present

## 2018-12-23 DIAGNOSIS — Z955 Presence of coronary angioplasty implant and graft: Secondary | ICD-10-CM

## 2018-12-23 DIAGNOSIS — I5042 Chronic combined systolic (congestive) and diastolic (congestive) heart failure: Secondary | ICD-10-CM | POA: Diagnosis present

## 2018-12-23 DIAGNOSIS — R441 Visual hallucinations: Secondary | ICD-10-CM | POA: Diagnosis present

## 2018-12-23 DIAGNOSIS — R29700 NIHSS score 0: Secondary | ICD-10-CM | POA: Diagnosis present

## 2018-12-23 DIAGNOSIS — I480 Paroxysmal atrial fibrillation: Secondary | ICD-10-CM | POA: Diagnosis present

## 2018-12-23 DIAGNOSIS — Z794 Long term (current) use of insulin: Secondary | ICD-10-CM

## 2018-12-23 DIAGNOSIS — G4733 Obstructive sleep apnea (adult) (pediatric): Secondary | ICD-10-CM | POA: Diagnosis present

## 2018-12-23 DIAGNOSIS — Z888 Allergy status to other drugs, medicaments and biological substances status: Secondary | ICD-10-CM

## 2018-12-23 DIAGNOSIS — I130 Hypertensive heart and chronic kidney disease with heart failure and stage 1 through stage 4 chronic kidney disease, or unspecified chronic kidney disease: Secondary | ICD-10-CM | POA: Diagnosis present

## 2018-12-23 DIAGNOSIS — G459 Transient cerebral ischemic attack, unspecified: Principal | ICD-10-CM

## 2018-12-23 DIAGNOSIS — Z8701 Personal history of pneumonia (recurrent): Secondary | ICD-10-CM

## 2018-12-23 DIAGNOSIS — R4789 Other speech disturbances: Secondary | ICD-10-CM

## 2018-12-23 DIAGNOSIS — Z951 Presence of aortocoronary bypass graft: Secondary | ICD-10-CM

## 2018-12-23 DIAGNOSIS — Z9981 Dependence on supplemental oxygen: Secondary | ICD-10-CM

## 2018-12-23 DIAGNOSIS — Z85820 Personal history of malignant melanoma of skin: Secondary | ICD-10-CM

## 2018-12-23 DIAGNOSIS — E669 Obesity, unspecified: Secondary | ICD-10-CM | POA: Diagnosis present

## 2018-12-23 DIAGNOSIS — R29810 Facial weakness: Secondary | ICD-10-CM | POA: Diagnosis present

## 2018-12-23 DIAGNOSIS — Z8249 Family history of ischemic heart disease and other diseases of the circulatory system: Secondary | ICD-10-CM

## 2018-12-23 DIAGNOSIS — Z7951 Long term (current) use of inhaled steroids: Secondary | ICD-10-CM

## 2018-12-23 DIAGNOSIS — I4891 Unspecified atrial fibrillation: Secondary | ICD-10-CM | POA: Diagnosis present

## 2018-12-23 DIAGNOSIS — Z79899 Other long term (current) drug therapy: Secondary | ICD-10-CM

## 2018-12-23 DIAGNOSIS — N1832 Chronic kidney disease, stage 3b: Secondary | ICD-10-CM | POA: Diagnosis present

## 2018-12-23 DIAGNOSIS — Z7901 Long term (current) use of anticoagulants: Secondary | ICD-10-CM

## 2018-12-23 DIAGNOSIS — Z881 Allergy status to other antibiotic agents status: Secondary | ICD-10-CM

## 2018-12-23 DIAGNOSIS — R270 Ataxia, unspecified: Secondary | ICD-10-CM | POA: Diagnosis present

## 2018-12-23 DIAGNOSIS — K219 Gastro-esophageal reflux disease without esophagitis: Secondary | ICD-10-CM | POA: Diagnosis present

## 2018-12-23 DIAGNOSIS — J9611 Chronic respiratory failure with hypoxia: Secondary | ICD-10-CM | POA: Diagnosis present

## 2018-12-23 DIAGNOSIS — E785 Hyperlipidemia, unspecified: Secondary | ICD-10-CM | POA: Diagnosis present

## 2018-12-23 DIAGNOSIS — Z85828 Personal history of other malignant neoplasm of skin: Secondary | ICD-10-CM

## 2018-12-23 DIAGNOSIS — E1151 Type 2 diabetes mellitus with diabetic peripheral angiopathy without gangrene: Secondary | ICD-10-CM | POA: Diagnosis present

## 2018-12-23 DIAGNOSIS — E118 Type 2 diabetes mellitus with unspecified complications: Secondary | ICD-10-CM | POA: Diagnosis present

## 2018-12-23 DIAGNOSIS — Z8673 Personal history of transient ischemic attack (TIA), and cerebral infarction without residual deficits: Secondary | ICD-10-CM

## 2018-12-23 DIAGNOSIS — E1122 Type 2 diabetes mellitus with diabetic chronic kidney disease: Secondary | ICD-10-CM | POA: Diagnosis present

## 2018-12-23 DIAGNOSIS — F329 Major depressive disorder, single episode, unspecified: Secondary | ICD-10-CM | POA: Diagnosis present

## 2018-12-23 LAB — CBC
HCT: 34.7 % — ABNORMAL LOW (ref 36.0–46.0)
Hemoglobin: 10.1 g/dL — ABNORMAL LOW (ref 12.0–15.0)
MCH: 24.4 pg — ABNORMAL LOW (ref 26.0–34.0)
MCHC: 29.1 g/dL — ABNORMAL LOW (ref 30.0–36.0)
MCV: 83.8 fL (ref 80.0–100.0)
Platelets: 203 10*3/uL (ref 150–400)
RBC: 4.14 MIL/uL (ref 3.87–5.11)
RDW: 17 % — ABNORMAL HIGH (ref 11.5–15.5)
WBC: 9.2 10*3/uL (ref 4.0–10.5)
nRBC: 0 % (ref 0.0–0.2)

## 2018-12-23 LAB — DIFFERENTIAL
Abs Immature Granulocytes: 0.03 10*3/uL (ref 0.00–0.07)
Basophils Absolute: 0.1 10*3/uL (ref 0.0–0.1)
Basophils Relative: 1 %
Eosinophils Absolute: 0.5 10*3/uL (ref 0.0–0.5)
Eosinophils Relative: 6 %
Immature Granulocytes: 0 %
Lymphocytes Relative: 12 %
Lymphs Abs: 1.1 10*3/uL (ref 0.7–4.0)
Monocytes Absolute: 0.8 10*3/uL (ref 0.1–1.0)
Monocytes Relative: 8 %
Neutro Abs: 6.8 10*3/uL (ref 1.7–7.7)
Neutrophils Relative %: 73 %

## 2018-12-23 LAB — CBG MONITORING, ED
Glucose-Capillary: 120 mg/dL — ABNORMAL HIGH (ref 70–99)
Glucose-Capillary: 111 mg/dL — ABNORMAL HIGH (ref 70–99)

## 2018-12-23 LAB — RAPID URINE DRUG SCREEN, HOSP PERFORMED
Amphetamines: NOT DETECTED
Barbiturates: NOT DETECTED
Benzodiazepines: DETECTED — AB
Cocaine: NOT DETECTED
Opiates: NOT DETECTED
Tetrahydrocannabinol: NOT DETECTED

## 2018-12-23 LAB — COMPREHENSIVE METABOLIC PANEL
ALT: 17 U/L (ref 0–44)
AST: 23 U/L (ref 15–41)
Albumin: 3.5 g/dL (ref 3.5–5.0)
Alkaline Phosphatase: 84 U/L (ref 38–126)
Anion gap: 9 (ref 5–15)
BUN: 19 mg/dL (ref 8–23)
CO2: 31 mmol/L (ref 22–32)
Calcium: 9.3 mg/dL (ref 8.9–10.3)
Chloride: 98 mmol/L (ref 98–111)
Creatinine, Ser: 1.47 mg/dL — ABNORMAL HIGH (ref 0.44–1.00)
GFR calc Af Amer: 41 mL/min — ABNORMAL LOW (ref >60)
GFR calc non Af Amer: 36 mL/min — ABNORMAL LOW (ref >60)
Glucose, Bld: 207 mg/dL — ABNORMAL HIGH (ref 70–99)
Potassium: 3.8 mmol/L (ref 3.5–5.1)
Sodium: 138 mmol/L (ref 135–145)
Total Bilirubin: 0.5 mg/dL (ref 0.3–1.2)
Total Protein: 6.7 g/dL (ref 6.5–8.1)

## 2018-12-23 LAB — APTT: aPTT: 35 seconds (ref 24–36)

## 2018-12-23 LAB — PROTIME-INR
INR: 1.6 — ABNORMAL HIGH (ref 0.8–1.2)
Prothrombin Time: 19 seconds — ABNORMAL HIGH (ref 11.4–15.2)

## 2018-12-23 LAB — GLUCOSE, CAPILLARY: Glucose-Capillary: 153 mg/dL — ABNORMAL HIGH (ref 70–99)

## 2018-12-23 MED ORDER — STROKE: EARLY STAGES OF RECOVERY BOOK
Freq: Once | Status: DC
  Filled 2018-12-23: qty 1

## 2018-12-23 MED ORDER — OCUVITE EYE HEALTH FORMULA PO CAPS: 1.0000 | ORAL_CAPSULE | Freq: Every morning | ORAL | Status: DC

## 2018-12-23 MED ORDER — DULOXETINE HCL 30 MG PO CPEP
30.0000 mg | ORAL_CAPSULE | Freq: Every day | ORAL | Status: DC
  Administered 2018-12-24 – 2018-12-26 (×3): 30 mg via ORAL
  Filled 2018-12-23 (×3): qty 1

## 2018-12-23 MED ORDER — ASPIRIN 300 MG RE SUPP: 150.0000 mg | Freq: Every day | RECTAL | Status: DC

## 2018-12-23 MED ORDER — GABAPENTIN 300 MG PO CAPS: 300.0000 mg | ORAL_CAPSULE | Freq: Every day | ORAL | Status: DC | PRN

## 2018-12-23 MED ORDER — CLOPIDOGREL BISULFATE 75 MG PO TABS
75.0000 mg | ORAL_TABLET | Freq: Every day | ORAL | Status: DC
  Administered 2018-12-23 – 2018-12-25 (×3): 75 mg via ORAL
  Filled 2018-12-23 (×3): qty 1

## 2018-12-23 MED ORDER — BUDESONIDE 0.25 MG/2ML IN SUSP: 0.2500 mg | Freq: Two times a day (BID) | RESPIRATORY_TRACT | Status: DC | PRN

## 2018-12-23 MED ORDER — SODIUM CHLORIDE 0.9% FLUSH: 3.0000 mL | Freq: Once | INTRAVENOUS | Status: DC

## 2018-12-23 MED ORDER — ROSUVASTATIN CALCIUM 20 MG PO TABS
40.0000 mg | ORAL_TABLET | Freq: Every day | ORAL | Status: DC
  Administered 2018-12-23 – 2018-12-25 (×3): 40 mg via ORAL
  Filled 2018-12-23 (×3): qty 2

## 2018-12-23 MED ORDER — SENNOSIDES-DOCUSATE SODIUM 8.6-50 MG PO TABS: 1.0000 | ORAL_TABLET | Freq: Every evening | ORAL | Status: DC | PRN

## 2018-12-23 MED ORDER — RANOLAZINE ER 500 MG PO TB12
500.0000 mg | ORAL_TABLET | Freq: Two times a day (BID) | ORAL | Status: DC
  Administered 2018-12-23 – 2018-12-26 (×6): 500 mg via ORAL
  Filled 2018-12-23 (×7): qty 1

## 2018-12-23 MED ORDER — MOMETASONE FURO-FORMOTEROL FUM 200-5 MCG/ACT IN AERO
2.0000 | INHALATION_SPRAY | Freq: Two times a day (BID) | RESPIRATORY_TRACT | Status: DC
  Administered 2018-12-24 – 2018-12-26 (×5): 2 via RESPIRATORY_TRACT
  Filled 2018-12-23: qty 8.8

## 2018-12-23 MED ORDER — ALPRAZOLAM 0.5 MG PO TABS: 0.5000 mg | ORAL_TABLET | Freq: Every day | ORAL | Status: DC | PRN

## 2018-12-23 MED ORDER — ALBUTEROL SULFATE (2.5 MG/3ML) 0.083% IN NEBU: 3.0000 mL | INHALATION_SOLUTION | RESPIRATORY_TRACT | Status: DC | PRN

## 2018-12-23 MED ORDER — APIXABAN 5 MG PO TABS
5.0000 mg | ORAL_TABLET | Freq: Two times a day (BID) | ORAL | Status: DC
  Administered 2018-12-23 – 2018-12-26 (×6): 5 mg via ORAL
  Filled 2018-12-23 (×6): qty 1

## 2018-12-23 MED ORDER — BUPROPION HCL ER (XL) 150 MG PO TB24
150.0000 mg | ORAL_TABLET | Freq: Every day | ORAL | Status: DC
  Administered 2018-12-24 – 2018-12-26 (×3): 150 mg via ORAL
  Filled 2018-12-23 (×3): qty 1

## 2018-12-23 MED ORDER — ACETAMINOPHEN 160 MG/5ML PO SOLN: 650.0000 mg | ORAL | Status: DC | PRN

## 2018-12-23 MED ORDER — ACETAMINOPHEN 325 MG PO TABS
650.0000 mg | ORAL_TABLET | ORAL | Status: DC | PRN
  Administered 2018-12-24: 650 mg via ORAL
  Filled 2018-12-23: qty 2

## 2018-12-23 MED ORDER — INSULIN ASPART 100 UNIT/ML ~~LOC~~ SOLN
0.0000 [IU] | Freq: Three times a day (TID) | SUBCUTANEOUS | Status: DC
  Administered 2018-12-24: 2 [IU] via SUBCUTANEOUS
  Administered 2018-12-24 – 2018-12-25 (×4): 3 [IU] via SUBCUTANEOUS
  Administered 2018-12-26: 2 [IU] via SUBCUTANEOUS
  Administered 2018-12-26: 3 [IU] via SUBCUTANEOUS

## 2018-12-23 MED ORDER — ASPIRIN 81 MG PO CHEW
81.0000 mg | CHEWABLE_TABLET | Freq: Every day | ORAL | Status: DC
  Administered 2018-12-23 – 2018-12-26 (×4): 81 mg via ORAL
  Filled 2018-12-23 (×4): qty 1

## 2018-12-23 MED ORDER — PANTOPRAZOLE SODIUM 40 MG PO TBEC
40.0000 mg | DELAYED_RELEASE_TABLET | Freq: Every day | ORAL | Status: DC
  Administered 2018-12-24 – 2018-12-26 (×3): 40 mg via ORAL
  Filled 2018-12-23 (×3): qty 1

## 2018-12-23 MED ORDER — ALPRAZOLAM 0.5 MG PO TABS
1.0000 mg | ORAL_TABLET | Freq: Every day | ORAL | Status: DC
  Administered 2018-12-23 – 2018-12-25 (×3): 1 mg via ORAL
  Filled 2018-12-23 (×3): qty 2

## 2018-12-23 MED ORDER — BISACODYL 5 MG PO TBEC: 5.0000 mg | DELAYED_RELEASE_TABLET | Freq: Every day | ORAL | Status: DC | PRN

## 2018-12-23 MED ORDER — ACETAMINOPHEN 650 MG RE SUPP: 650.0000 mg | RECTAL | Status: DC | PRN

## 2018-12-23 MED ORDER — ALPRAZOLAM 0.5 MG PO TABS
0.5000 mg | ORAL_TABLET | Freq: Every day | ORAL | Status: DC
  Administered 2018-12-24 – 2018-12-26 (×3): 0.5 mg via ORAL
  Filled 2018-12-23 (×3): qty 1

## 2018-12-23 MED ORDER — ZOLPIDEM TARTRATE 5 MG PO TABS
5.0000 mg | ORAL_TABLET | Freq: Every day | ORAL | Status: DC
  Administered 2018-12-23 – 2018-12-25 (×3): 5 mg via ORAL
  Filled 2018-12-23 (×3): qty 1

## 2018-12-23 MED ORDER — GABAPENTIN 300 MG PO CAPS
300.0000 mg | ORAL_CAPSULE | Freq: Every day | ORAL | Status: DC
  Administered 2018-12-23 – 2018-12-25 (×3): 300 mg via ORAL
  Filled 2018-12-23 (×3): qty 1

## 2018-12-23 MED ORDER — SODIUM CHLORIDE 0.9 % IV SOLN
INTRAVENOUS | Status: DC
  Administered 2018-12-23 – 2018-12-24 (×2): via INTRAVENOUS

## 2018-12-23 MED ORDER — INSULIN GLARGINE 100 UNIT/ML ~~LOC~~ SOLN
38.0000 [IU] | Freq: Every day | SUBCUTANEOUS | Status: DC
  Administered 2018-12-23 – 2018-12-25 (×3): 38 [IU] via SUBCUTANEOUS
  Filled 2018-12-23 (×4): qty 0.38

## 2018-12-23 MED ORDER — IPRATROPIUM-ALBUTEROL 0.5-2.5 (3) MG/3ML IN SOLN: 3.0000 mL | Freq: Two times a day (BID) | RESPIRATORY_TRACT | Status: DC | PRN

## 2018-12-23 MED ORDER — GABAPENTIN 300 MG PO CAPS
600.0000 mg | ORAL_CAPSULE | Freq: Every day | ORAL | Status: DC
  Administered 2018-12-24 – 2018-12-26 (×3): 600 mg via ORAL
  Filled 2018-12-23 (×3): qty 2

## 2018-12-23 MED ORDER — INSULIN ASPART 100 UNIT/ML ~~LOC~~ SOLN: 0.0000 [IU] | Freq: Every day | SUBCUTANEOUS | Status: DC

## 2018-12-23 NOTE — H&P (Signed)
History and Physical    [name] [name] GLO:[id number] DOB: 23-May-1945 DOA: 12/23/2018  PCP: Derinda Late, MD Consultants:  Jannifer Franklin - neurology; Stann Mainland - orthopedics; Bensimhon - cardiology; Parrett - pulmonology; Nichols - vascular Patient coming from:  Home - lives with husband, but daughter is present much of the time; NOK: Husband, [phone]; daughter, [phone]  Chief Complaint:  "TIAs"  HPI: [name] [name] is a 73 y.o. female with medical history significant of CVA; DM; PVD; OSA not on CPAP; obesity; dementia; HTN; HLD;  Chronic low back pain; CKD; home O2; and chronic combined CHF presenting with "TIAs".  The patient's daughter provides most of the history.  She reports that the patient normally has about 10 "ghost TIAs" per day, where she has a dead stare and zones out.  She also has intermittent TIAs about once a month, but these have been occurring faster and stronger than usual.  She has had about 9 episodes in the last 10 days.  She has had excessive slurred speech, difficulty finishing her sentences, audio and visual hallucinations, n/v, gasping respirations, difficulty with memory, L face and eye drooping, LE tingling, ataxia, and weakness with episodes.  Her left "carotid was distended" last Thursday.  She also had trouble seeing one day.  The patient seems to be comfortable and without apparent complaint now.    ED Course: 7 TIA in 10 days.  H/o CVA, CEA.  Reports episodes with aphasia vs. Dysarthria, some left-sided weakness.  Called neurology and told to come in.  Neurology recommends MRI/MRA and carotid US.  Review of Systems: As per HPI; otherwise review of systems reviewed and negative.    Past Medical History:  Diagnosis Date  . Anemia    hx  . Anxiety   . Asthma   . Basal cell carcinoma 05/2014   "left shoulder"  . Bundle branch block, left    chronic/notes 07/18/2013  . CHF (congestive heart failure) ([place])   . Chronic insomnia 05/06/2015  . Chronic kidney  disease    frequency, sees dr Jamal Maes every 4 to 6 months (01/16/2018)  . Chronic lower back pain   . Claustrophobia   . Common migraine 05/14/2014  . Coronary artery disease    MI in 2001, 2002, 2006, 2011, 2014  . Depression   . GERD (gastroesophageal reflux disease)   . H/O hiatal hernia   . Headache    "at least 2/month" (01/16/2018)  . Heart murmur   . Hyperlipidemia   . Hypertension   . Memory change 05/14/2014  . Migraine    "5-6/year"  (01/16/2018)  . Obesity 01-2010  . Obstructive sleep apnea    "can't wear machine; I have claustrophobia" (01/16/2018), states she had a 2nd sleep study and does not have sleep apnea, her O2 decreases and now is on 2 L of O2 at night.  . On home oxygen therapy    "2L at night and prn during daytime" (01/16/2018)  . Osteoarthritis    "knees and hands" (01/16/2018)  . Peripheral vascular disease (HCC)    ? numbness, tingling arms and legs  . PONV (postoperative nausea and vomiting)   . Stroke [facility])    2014, 2015, 2016  . Type II diabetes mellitus ([place])   . Ventral hernia    hx of    Past Surgical History:  Procedure Laterality Date  . ANKLE FRACTURE SURGERY Left 1970's  . APPENDECTOMY  1970's   w/hysterectomy  . BASAL CELL CARCINOMA EXCISION Left 05/2014   "  shoulder" (01/16/2018)  . CARDIAC CATHETERIZATION  10/10/2012   Dr [name].  Marland Kitchen CARDIAC CATHETERIZATION N/A 05/29/2015   Procedure: Right/Left Heart Cath and Coronary/Graft Angiography;  Surgeon: Larey Dresser, MD;  Location: [place] CV LAB;  Service: Cardiovascular;  Laterality: N/A;  . CAROTID ENDARTERECTOMY Left 03/2013  . CAROTID STENT INSERTION Left 03/20/2013   Procedure: CAROTID STENT INSERTION;  Surgeon: [name], MD;  Location: [facility] CATH LAB;  Service: Cardiovascular;  Laterality: Left;  internal carotid  . CEREBRAL ANGIOGRAM N/A 04/05/2011   Procedure: CEREBRAL ANGIOGRAM;  Surgeon: Angelia Mould, MD;  Location: [facility] CATH LAB;  Service: Cardiovascular;   Laterality: N/A;  . CHOLECYSTECTOMY OPEN  2004  . CORONARY ANGIOPLASTY WITH STENT PLACEMENT  01,02,05,06,07,08,11; 04/24/2013   "I've probably got ~ 10 stents by now" (04/24/2013)  . CORONARY ANGIOPLASTY WITH STENT PLACEMENT  06/13/2013   "got 4 stents today" (06/13/2013)  . CORONARY ARTERY BYPASS GRAFT  1220/11   "CABG X5"  . CORONARY STENT INTERVENTION N/A 10/20/2017   Procedure: CORONARY STENT INTERVENTION;  Surgeon: Troy Sine, MD;  Location: [place] CV LAB;  Service: Cardiovascular;  Laterality: N/A;  . ESOPHAGOGASTRODUODENOSCOPY  08/03/2011   Procedure: ESOPHAGOGASTRODUODENOSCOPY (EGD);  Surgeon: Shann Medal, MD;  Location: [facility];  Service: General;  Laterality: N/A;  . ESOPHAGOGASTRODUODENOSCOPY (EGD) WITH PROPOFOL N/A 03/11/2014   Procedure: ESOPHAGOGASTRODUODENOSCOPY (EGD) WITH PROPOFOL;  Surgeon: Lafayette Dragon, MD;  Location: [facility] ENDOSCOPY;  Service: Endoscopy;  Laterality: N/A;  . FRACTURE SURGERY    . gall stone removal  05/2003  . GASTRIC RESTRICTION SURGERY  1984   "stapeling"  . HERNIA REPAIR  2004   "in my stomach; had OR on it twice", wire mesh on 1 hernia  . LEFT HEART CATH AND CORS/GRAFTS ANGIOGRAPHY N/A 07/09/2016   Procedure: LEFT HEART CATH AND CORS/GRAFTS ANGIOGRAPHY;  Surgeon: Larey Dresser, MD;  Location: [place] CV LAB;  Service: Cardiovascular;  Laterality: N/A;  . ORIF ANKLE FRACTURE Left 05/16/2018  . ORIF ANKLE FRACTURE Left 05/16/2018   Procedure: OPEN REDUCTION INTERNAL FIXATION (ORIF) Left ankle with possible syndesmosis fixation;  Surgeon: Nicholes Stairs, MD;  Location: [place];  Service: Orthopedics;  Laterality: Left;  150min  . OVARY SURGERY  1970's   "tumor removed"  . PERCUTANEOUS CORONARY STENT INTERVENTION (PCI-S) N/A 06/13/2013   Procedure: PERCUTANEOUS CORONARY STENT INTERVENTION (PCI-S);  Surgeon: [name], MD;  Location: [facility] CATH LAB;  Service: Cardiovascular;  Laterality: N/A;  . PERCUTANEOUS STENT INTERVENTION N/A  04/24/2013   Procedure: PERCUTANEOUS STENT INTERVENTION;  Surgeon: [name], MD;  Location: [facility] CATH LAB;  Service: Cardiovascular;  Laterality: N/A;  . RIGHT/LEFT HEART CATH AND CORONARY/GRAFT ANGIOGRAPHY N/A 10/20/2017   Procedure: RIGHT/LEFT HEART CATH AND CORONARY/GRAFT ANGIOGRAPHY;  Surgeon: Larey Dresser, MD;  Location: [place] CV LAB;  Service: Cardiovascular;  Laterality: N/A;  . ROOT CANAL  10/2000  . TIBIA FRACTURE SURGERY Right 1970's   rods and pins  . TOOTH EXTRACTION     "1 on the upper; wisdom tooth on the lower" (01/16/2018)  . TOTAL ABDOMINAL HYSTERECTOMY  1970's   w/ appendectomy    Social History   Socioeconomic History  . Marital status: Married    Spouse name: [name]   . Number of children: 1  . Years of education: 12+  . Highest education level: Not on file  Occupational History  . Occupation: retired  Scientific laboratory technician  . Financial resource strain: Not on  file  . Food insecurity    Worry: Not on file    Inability: Not on file  . Transportation needs    Medical: Not on file    Non-medical: Not on file  Tobacco Use  . Smoking status: Never Smoker  . Smokeless tobacco: Never Used  Substance and Sexual Activity  . Alcohol use: Never    Frequency: Never  . Drug use: Never  . Sexual activity: Not Currently    Birth control/protection: Surgical    Comment: hysterectomy  Lifestyle  . Physical activity    Days per week: Not on file    Minutes per session: Not on file  . Stress: Not on file  Relationships  . Social Herbalist on phone: Not on file    Gets together: Not on file    Attends religious service: Not on file    Active member of club or organization: Not on file    Attends meetings of clubs or organizations: Not on file    Relationship status: Not on file  . Intimate partner violence    Fear of current or ex partner: Not on file    Emotionally abused: Not on file    Physically abused: Not on file    Forced sexual  activity: Not on file  Other Topics Concern  . Not on file  Social History Narrative   Patient lives at home with husband [name].    Patient has 1 child.    Patient is not currently working.    Patient has 2 years of college.    Patient is right handed.   Patient drinks caffeine occasionally.    Allergies  Allergen Reactions  . Amoxicillin Shortness Of Breath and Rash  . Brilinta [Ticagrelor] Shortness Of Breath  . Erythromycin Shortness Of Breath, Other (See Comments) and Hives    Trouble swallowing  . Flagyl [Metronidazole] Shortness Of Breath and Palpitations  . Penicillins Hives, Shortness Of Breath, Rash and Other (See Comments)    Has patient had a PCN reaction causing immediate rash, facial/tongue/throat swelling, SOB or lightheadedness with hypotension: Yes Has patient had a PCN reaction causing severe rash involving mucus membranes or skin necrosis: No Has patient had a PCN reaction that required hospitalization: Yes Has patient had a PCN reaction occurring within the last 10 years: No If all of the above answers are "NO", then may proceed with Cephalosporin use.   . Isosorbide Mononitrate [Isosorbide Nitrate] Other (See Comments)    Joint aches, muscles hurt, difficult to walk   . Jardiance [Empagliflozin] Other (See Comments)    Nausea, joint aches, muscles aches  . Metformin And Related Other (See Comments)    Stomach pain, cold sweats, joint pain, burred vision, dizziness  . Tape Other (See Comments)    Skin pulls off with certain types Plastic tape causes skin to rip if left on for long periods of time  . Erythromycin Base Rash    Family History  Problem Relation Age of Onset  . Heart disease Father        Heart Disease before age 68  . Diabetes Father   . Hyperlipidemia Father   . Hypertension Father   . Heart attack Father   . Deep vein thrombosis Father   . AAA (abdominal aortic aneurysm) Father   . Hypertension Mother   . Deep vein thrombosis Mother    . AAA (abdominal aortic aneurysm) Mother   . Cancer Sister  Ovarian  . Hypertension Sister   . Diabetes Paternal Grandmother   . Heart disease Paternal Uncle   . Diabetes Paternal Uncle   . Heart attack Paternal Grandfather 83       died of MI at 3  . Diabetes Paternal Aunt   . Diabetes Paternal Uncle   . Colon cancer Neg Hx     Prior to Admission medications   Medication Sig Start Date End Date Taking? Authorizing Provider  acetaminophen (TYLENOL) 500 MG tablet Take 1 tablet (500 mg total) by mouth every 6 (six) hours as needed for moderate pain or fever. Patient taking differently: Take 1,000 mg by mouth every 6 (six) hours as needed for moderate pain, fever or headache.  09/28/18  Yes Angiulli, Lavon Paganini, PA-C  albuterol (VENTOLIN HFA) 108 (90 Base) MCG/ACT inhaler Inhale 2 puffs into the lungs every 4 (four) hours as needed for wheezing or shortness of breath.    Yes [provider]  Alirocumab (PRALUENT) 150 MG/ML SOAJ Inject 1 pen into the skin every 14 (fourteen) days. Patient taking differently: Inject 150 mg into the skin every 14 (fourteen) days.  08/18/18  Yes Larey Dresser, MD  ALPRAZolam Duanne Moron) 0.5 MG tablet Take 1 tablet (0.5 mg total) by mouth 2 (two) times daily as needed for anxiety. Patient taking differently: Take 0.5-1 mg by mouth See admin instructions. Take one tablet (0.5 mg) by mouth every morning and two tablets (1 mg) at night, may also take 1 tablet (0.5 mg) midday as needed for anxiety 09/28/18  Yes Angiulli, Lavon Paganini, PA-C  amLODipine (NORVASC) 5 MG tablet Take 1 tablet (5 mg total) by mouth daily. Patient taking differently: Take 5 mg by mouth at bedtime.  10/06/18 01/04/19 Yes Larey Dresser, MD  apixaban (ELIQUIS) 5 MG TABS tablet Take 1 tablet (5 mg total) by mouth 2 (two) times daily. 09/28/18  Yes Angiulli, Lavon Paganini, PA-C  bisacodyl (DULCOLAX) 5 MG EC tablet Take 1 tablet (5 mg total) by mouth daily as needed for moderate constipation.  05/19/18 05/19/19 Yes Nicholes Stairs, MD  budesonide (PULMICORT) 0.25 MG/2ML nebulizer solution Take 2 mLs (0.25 mg total) by nebulization 2 (two) times daily. Patient taking differently: Take 0.25 mg by nebulization 2 (two) times daily as needed (asthma flares).  04/10/18  Yes Cherene Altes, MD  buPROPion (WELLBUTRIN XL) 150 MG 24 hr tablet Take 1 tablet (150 mg total) by mouth daily. 09/28/18  Yes Angiulli, Lavon Paganini, PA-C  clopidogrel (PLAVIX) 75 MG tablet Take 1 tablet (75 mg total) by mouth at bedtime. 09/28/18  Yes Angiulli, Lavon Paganini, PA-C  denosumab (PROLIA) 60 MG/ML SOSY injection Inject 60 mg into the skin every 6 (six) months.    Yes [provider]  dextromethorphan-guaiFENesin (MUCINEX DM) 30-600 MG 12hr tablet Take 1 tablet by mouth 2 (two) times daily. Patient taking differently: Take 1 tablet by mouth every morning.  09/28/18  Yes Angiulli, Lavon Paganini, PA-C  DULoxetine (CYMBALTA) 30 MG capsule Take 1 capsule (30 mg total) by mouth daily. 09/28/18  Yes Angiulli, Lavon Paganini, PA-C  ezetimibe (ZETIA) 10 MG tablet Take 1 tablet (10 mg total) by mouth at bedtime. 04/10/18  Yes Cherene Altes, MD  Fluticasone-Salmeterol (ADVAIR) 250-50 MCG/DOSE AEPB Inhale 1 puff into the lungs daily.   Yes [provider]  gabapentin (NEURONTIN) 300 MG capsule Take 1 capsule (300 mg total) by mouth daily. Patient taking differently: Take 300-600 mg by mouth See admin instructions.  Take two capsules (600 mg) by mouth every morning and one capsule (300 mg) at night; may also take one capsule (300 mg) midday as needed for nerve pain 09/28/18  Yes Angiulli, Lavon Paganini, PA-C  HYDROcodone-acetaminophen (NORCO/VICODIN) 5-325 MG tablet Take 1 tablet by mouth at bedtime as needed for moderate pain.   Yes [provider]  Insulin Glargine (LANTUS SOLOSTAR) 100 UNIT/ML Solostar Pen Inject 38-44 Units into the skin See admin instructions. Inject 38 units subcutaneously daily at bedtime, increase  to 44 units for CBG >200   Yes [provider]  insulin lispro (HUMALOG KWIKPEN) 100 UNIT/ML KwikPen Inject 14-18 Units into the skin See admin instructions. Inject 14 units subcutaneously prior to breakfast and supper; add 4 units for CBG >200   Yes [provider]  ipratropium-albuterol (DUONEB) 0.5-2.5 (3) MG/3ML SOLN Take 3 mLs by nebulization 2 (two) times daily. Patient taking differently: Take 3 mLs by nebulization 2 (two) times daily as needed (shortness of breath).  04/10/18  Yes Cherene Altes, MD  metolazone (ZAROXOLYN) 2.5 MG tablet Take 1 tablet (2.5 mg total) by mouth daily. As directed by heart failure clinic. Call heart failure office for direction Patient taking differently: Take 2.5 mg by mouth daily as needed (As directed by heart failure clinic. Call heart failure office for direction when experiencing overnight weight gain).  11/24/18  Yes Bensimhon, Shaune Pascal, MD  metoprolol succinate (TOPROL-XL) 50 MG 24 hr tablet Take 1 tablet (50 mg total) by mouth daily. Take with or immediately following a meal. 09/28/18  Yes Angiulli, Lavon Paganini, PA-C  Multiple Vitamin (MULTIVITAMIN WITH MINERALS) TABS tablet Take 1 tablet by mouth every morning. Centrum - Women over 31   Yes [provider]  Multiple Vitamins-Minerals ([place]) CAPS Take 1 capsule by mouth every morning.   Yes [provider]  nitroGLYCERIN (NITROSTAT) 0.3 MG SL tablet Place 1 tablet (0.3 mg total) under the tongue every 5 (five) minutes x 3 doses as needed for chest pain. 09/15/17  Yes Georgiana Shore, NP  ondansetron (ZOFRAN-ODT) 4 MG disintegrating tablet Take 4 mg by mouth 2 (two) times daily as needed for nausea or vomiting.   Yes [provider]  OXYGEN Inhale 2 L into the lungs at bedtime as needed (shortness of breath).    Yes [provider]  pantoprazole (PROTONIX) 40 MG tablet Take 1 tablet (40 mg total) by mouth daily. 09/28/18  Yes Angiulli,  Lavon Paganini, PA-C  Polyvinyl Alcohol-Povidone (REFRESH OP) Place 1 drop into both eyes daily as needed (redness/ dry eyes).   Yes [provider]  potassium chloride SA (K-DUR) 20 MEQ tablet TAKE ONE (1) TABLET BY MOUTH TWO (2) TIMES DAILY Patient taking differently: Take 10 mEq by mouth 2 (two) times daily.  09/12/18  Yes Larey Dresser, MD  Pyridoxine HCl (VITAMIN B-6 PO) Take 1 tablet by mouth every morning.   Yes [provider]  ranolazine (RANEXA) 500 MG 12 hr tablet Take 1 tablet (500 mg total) by mouth 2 (two) times daily. 09/28/18  Yes Angiulli, Lavon Paganini, PA-C  rosuvastatin (CRESTOR) 40 MG tablet Take 1 tablet (40 mg total) by mouth at bedtime. 09/28/18  Yes Angiulli, Lavon Paganini, PA-C  spironolactone (ALDACTONE) 25 MG tablet Take 1 tablet (25 mg total) by mouth daily. 11/13/18  Yes Larey Dresser, MD  torsemide (DEMADEX) 20 MG tablet Take 40-80 mg by mouth See admin instructions. Take two tablets (40 mg) by  mouth every morning, take four tablets (80 mg) for increased swelling of legs and arms - max 4 tablets daily   Yes [provider]  triamcinolone cream (KENALOG) 0.1 % Apply 1 application topically daily as needed (crusty spots on arms).   Yes [provider]  vitamin B-12 (CYANOCOBALAMIN) 1000 MCG tablet Take 1,000 mcg by mouth every morning.    Yes [provider]  zolpidem (AMBIEN) 10 MG tablet Take 10 mg by mouth at bedtime.   Yes [provider]  ketoconazole (NIZORAL) 2 % cream Apply 1 application topically daily. Patient not taking: Reported on 12/23/2018 10/12/18   Bayard Hugger, NP  torsemide (DEMADEX) 20 MG tablet Take 2 tablets (40 mg total) by mouth 2 (two) times daily for 2 days, THEN 2 tablets (40 mg total) daily. Patient not taking: Reported on 12/23/2018 11/08/18 12/10/18  Darrick Grinder D, NP    Physical Exam: Vitals:   12/23/18 1445 12/23/18 1500 12/23/18 1515 12/23/18 1530  BP: (!) 148/66 (!) 143/59 (!) 149/66   Pulse:  (!) 54 (!) 53 (!) 55 (!) 56  Resp: 19 18 16 17   Temp:      TempSrc:      SpO2: 97% 99% 95% 97%     . General:  Appears calm and comfortable and is NAD, somewhat eccentric . Eyes:  PERRL, EOMI, normal lids, iris . ENT:  grossly normal hearing, lips & tongue, mmm . Neck:  no LAD, masses or thyromegaly; no carotid bruits . Cardiovascular:  RR with mild bradycardia, no m/r/g. No LE edema.  Marland Kitchen Respiratory:   CTA bilaterally with no wheezes/rales/rhonchi.  Normal respiratory effort. . Abdomen:  soft, NT, ND, NABS . Skin:  no rash or induration seen on limited exam . Musculoskeletal:  grossly normal tone BUE/BLE, good ROM, no bony abnormality . Lower extremity:  No LE edema.  Limited foot exam with no ulcerations.  2+ distal pulses. Marland Kitchen Psychiatric:  grossly normal mood and affect, speech fluent and appropriate, AOx3 . Neurologic:  CN 2-12 grossly intact, moves all extremities in coordinated fashion, sensation intact    Radiological Exams on Admission: No results found.  EKG: Independently reviewed.  NSR with rate 59; LBBB; nonspecific ST changes with no evidence of acute ischemia   Labs on Admission: I have personally reviewed the available labs and imaging studies at the time of the admission.  Pertinent labs:   Glucose 207 BUN 19/Creatinine 1.47/GFR 36 - stable WBC 9.2 Hgb 10.1; 10.6 on 9/15 INR 1.6   Assessment/Plan Principal Problem:   TIA (transient ischemic attack) Active Problems:   Diabetes mellitus type 2, controlled, with complications ([place])   Hyperlipemia   Essential hypertension   Atrial fibrillation (HCC)   Chronic combined systolic (congestive) and diastolic (congestive) heart failure ([place])   CKD (chronic kidney disease), stage III   TIA -Patient with known severe intracranial stenosis presenting with symptoms atypical for TIA -She has known carotid stenosis with h/o L stent placement in 2015 -She had recent carotid US on 10/12 with mild R ICA stenosis and  no restenosis on the L; it seems unlikely that stenosis would have developed in such a short period of time, but neurology has recommended repeat carotid US -Will place in observation status for CVA/TIA evaluation -Telemetry monitoring -MRI/MRA -Echo -Risk stratification with FLP; will also check UDS -Continue Plavix, Eliquis -Neurology consult -PT/OT/ST/Nutrition Consults -With plethora of symptoms in conjunction with AV hallucinations, psychiatric consultation may be reasonable  HTN -Allow  permissive HTN for now -Treat BP only if >220/120, and then with goal of 15% reduction -Hold Norvasc and Toprol and plan to restart in 48-72 hours   HLD -Check FLP -Continue Crestor 40 mg daily -Hold Zetia due to limited inpatient utility -She is also on Praluent as an outpatient   DM -Recent A1c shows reasonably good control -Continue Lantus -Will order moderate-scale SSI  Afib on Eliquis -Holding Toprol, which is her rate controlling agent, so will need to be monitored -Continue Eliquis  Chronic combined CHF -Appears to be compensated at this time -Hold Demadex and Aldactone for now  Stage 3b CKD -Appears to be stable at this time -Repeat BMP in AM  CAD -No c/o angina pain at this time -Continue Ranexa  COPD -Continue home meds including prn O2, Duoneb, Advair, Pulmicort    Note: This patient has been tested and is ppending for the novel coronavirus COVID-19.      DVT prophylaxis:  Eliquis  Code Status: Full - confirmed with patient/family Family Communication: Daughter present throughout evaluation Disposition Plan:  Home once clinically improved Consults called: Neurology; PT/OT/ST/Nutrition  Admission status: It is my clinical opinion that referral for OBSERVATION is reasonable and necessary in this patient based on the above information provided. The aforementioned taken together are felt to place the patient at high risk for further clinical deterioration. However  it is anticipated that the patient may be medically stable for discharge from the hospital within 24 to 48 hours.   Karmen Bongo MD [facility]   How to contact the [facility] Attending or Consulting provider [place] or covering provider during after hours [place], for this patient?  1. Check the care team in [facility] and look for a) attending/consulting [facility] provider listed and b) the [facility] team listed 2. Log into [email or web address] and use [place]'s universal password to access. If you do not have the password, please contact the hospital operator. 3. Locate the [facility] provider you are looking for under [facility] and page to a number that you can be directly reached. 4. If you still have difficulty reaching the provider, please page the [facility] (Director on Call) for the Hospitalists listed on amion for assistance.   12/23/2018, 4:32 PM

## 2018-12-23 NOTE — ED Provider Notes (Signed)
[facility] EMERGENCY DEPARTMENT Provider Note   CSN: [id number] Arrival date & time: 12/23/18  1308     History   Chief Complaint Chief Complaint  Patient presents with   Transient Ischemic Attack    HPI [name] [name] is a 73 y.o. female.     The history is provided by the patient and medical records. No language interpreter was used.  Neurologic Problem This is a new problem. The current episode started 6 to 12 hours ago. The problem occurs daily. The problem has been resolved. Associated symptoms include headaches. Pertinent negatives include no chest pain, no abdominal pain and no shortness of breath. Nothing aggravates the symptoms. Nothing relieves the symptoms. She has tried nothing for the symptoms. The treatment provided no relief.    Past Medical History:  Diagnosis Date   Abnormality of gait 05/14/2014   Anemia    hx   Anginal pain (HCC)    Anxiety    Asthma    Basal cell carcinoma 05/2014   "left shoulder"   Bundle branch block, left    chronic/notes 07/18/2013   CHF (congestive heart failure) ([place])    Chronic bronchitis ([place])    "off and on all the time" (07/18/2013)   Chronic insomnia 05/06/2015   Chronic kidney disease    frequency, sees dr Jamal Maes every 4 to 6 months (01/16/2018)   Chronic low back pain 08/24/2016   Chronic lower back pain    Claustrophobia    Common migraine 05/14/2014   Coronary artery disease    Depression    Dysrhythmia    GERD (gastroesophageal reflux disease)    H/O hiatal hernia    Headache    "at least 2/month" (01/16/2018)   Heart murmur    Hyperlipidemia    Hypertension    Irregular heart beat    Leg cramps    both legs at times   Melanoma ([place]) 05/2014   "burned off BLE" (01/16/2018)   Memory change 05/14/2014   Migraine    "5-6/year"  (01/16/2018)   Myocardial infarction ([place]) 04/1999, 02/2000, 01/2005; 2011; 2014   Neuromuscular disorder ([place])    ?    Obesity 01-2010   Obstructive sleep apnea    "can't wear machine; I have claustrophobia" (01/16/2018), states she had a 2nd sleep study and does not have sleep apnea, her O2 decreases and now is on 2 L of O2 at night.   On home oxygen therapy    "2L at night and prn during daytime" (01/16/2018)   Osteoarthritis    "knees and hands" (01/16/2018)   Other and unspecified angina pectoris    Peripheral vascular disease ([place])    ? numbness, tingling arms and legs   Pneumonia 2000's   "once"   PONV (postoperative nausea and vomiting)    Shortness of breath    with exertion   Stroke ([place]) 03/22/12   right side brain; denies residual on 07/18/2013)   Stroke [facility]) Oct. 2015   Affected pt.s balance   Stroke ([place]) 10/2014   "affected my legs; fully recovered now"; still have sporatic memory issues from this one" (01/16/2018)   Type II diabetes mellitus ([place])    Ventral hernia    hx of   Vomiting    persistent   Vomiting blood     Patient Active Problem List   Diagnosis Date Noted   Debility 09/21/2018   Hypotension 09/18/2018   Bradycardia 09/18/2018   AKI (acute kidney injury) ([place])  09/18/2018   Pressure injury of skin 09/18/2018   Ischemic cerebrovascular accident (CVA) of frontal lobe ([place]) 09/13/2018   Fall 09/09/2018   Stroke ([place]) 09/09/2018   Type II diabetes mellitus with renal manifestations ([place]) 09/09/2018   CKD (chronic kidney disease), stage III 09/09/2018   Sepsis ([place]) 09/09/2018   Depression with anxiety 09/09/2018   Slurred speech 09/09/2018   Ankle fracture, left 05/16/2018   Amiodarone pulmonary toxicity    Physical deconditioning    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    Acute on chronic congestive heart failure (HCC)    Chronic systolic CHF (congestive heart failure) ([place]) 01/16/2018   Acute on chronic diastolic CHF (congestive heart failure) ([place]) 01/16/2018   Acute on chronic diastolic (congestive) heart failure ([place]) 01/16/2018     Atrial fibrillation ([place]) 10/21/2017   CAD (coronary artery disease) of bypass graft 10/20/2017   Coronary artery disease of bypass graft of native heart with stable angina pectoris (HCC)    Chronic low back pain 08/24/2016   Pain of left thumb 08/05/2016   Nocturnal hypoxia 06/02/2016   Hoarseness 04/05/2016   Laryngopharyngeal reflux (LPR) 04/05/2016   Pharyngoesophageal dysphagia 04/05/2016   Angina decubitus ([place]) 05/22/2015   Chronic insomnia 05/06/2015   Common migraine 05/14/2014   Abnormality of gait 05/14/2014   Memory change 05/14/2014   Aneurysm, cerebral, nonruptured 05/14/2014   Cramp of limb-Left neck 05/10/2014   Hematemesis with nausea    Vomiting blood    Dizziness and giddiness 09/21/2013   Atypical chest pain 07/18/2013   Unstable angina (HCC) 04/09/2013   Carotid artery stenosis, symptomatic 03/20/2013   Chronic diastolic CHF (congestive heart failure) ([place]) 09/23/2012   Cerebrovascular disease 07/10/2012   Cerebral artery occlusion with cerebral infarction ([place]) 07/10/2012   Mitral regurgitation 04/15/2012   TIA (transient ischemic attack) 04/15/2012   Occlusion and stenosis of carotid artery without mention of cerebral infarction 08/18/2011   Bariatric surgery status 06/17/2011   Speech abnormality 03/22/2011   Dyspnea 02/24/2011   PAPILLARY MUSCLE DYSFUNCTION, NON-RHEUMATIC 10/09/2008   UNSPECIFIED VITAMIN D DEFICIENCY 10/24/2007   MYOCARDIAL INFARCTION, HX OF 10/24/2007   PERSISTENT VOMITING 10/24/2007   OSTEOARTHRITIS 10/24/2007   MIGRAINES, HX OF 10/24/2007   DM 01/16/2007   Hyperlipemia 01/16/2007   Obesity-post failed open gastroplasty 1984  01/16/2007   OBSTRUCTIVE SLEEP APNEA 01/16/2007   Essential hypertension 01/16/2007   Coronary atherosclerosis 01/16/2007   Asthma 01/16/2007   GERD 01/16/2007   VENTRAL HERNIA 01/16/2007    Past Surgical History:  Procedure Laterality Date   ANKLE  FRACTURE SURGERY Left 1970's   APPENDECTOMY  1970's   w/hysterectomy   BASAL CELL CARCINOMA EXCISION Left 05/2014   "shoulder" (01/16/2018)   CARDIAC CATHETERIZATION  10/10/2012   Dr [name].   CARDIAC CATHETERIZATION N/A 05/29/2015   Procedure: Right/Left Heart Cath and Coronary/Graft Angiography;  Surgeon: Larey Dresser, MD;  Location: [place] [facility];  Service: Cardiovascular;  Laterality: N/A;   CAROTID ENDARTERECTOMY Left 03/2013   CAROTID STENT INSERTION Left 03/20/2013   Procedure: CAROTID STENT INSERTION;  Surgeon: [name], MD;  Location: [facility] CATH LAB;  Service: Cardiovascular;  Laterality: Left;  internal carotid   CEREBRAL ANGIOGRAM N/A 04/05/2011   Procedure: CEREBRAL ANGIOGRAM;  Surgeon: Angelia Mould, MD;  Location: [facility] CATH LAB;  Service: Cardiovascular;  Laterality: N/A;   CHOLECYSTECTOMY OPEN  2004   CORONARY ANGIOPLASTY WITH STENT PLACEMENT  01,02,05,06,07,08,11; 04/24/2013   "I've probably got ~ 10 stents by now" (04/24/2013)  CORONARY ANGIOPLASTY WITH STENT PLACEMENT  06/13/2013   "got 4 stents today" (06/13/2013)   CORONARY ARTERY BYPASS GRAFT  1220/11   "CABG X5"   CORONARY STENT INTERVENTION N/A 10/20/2017   Procedure: CORONARY STENT INTERVENTION;  Surgeon: Troy Sine, MD;  Location: [place] [facility] LAB;  Service: Cardiovascular;  Laterality: N/A;   ESOPHAGOGASTRODUODENOSCOPY  08/03/2011   Procedure: ESOPHAGOGASTRODUODENOSCOPY (EGD);  Surgeon: Shann Medal, MD;  Location: [facility];  Service: General;  Laterality: N/A;   ESOPHAGOGASTRODUODENOSCOPY (EGD) WITH PROPOFOL N/A 03/11/2014   Procedure: ESOPHAGOGASTRODUODENOSCOPY (EGD) WITH PROPOFOL;  Surgeon: Lafayette Dragon, MD;  Location: [facility] ENDOSCOPY;  Service: Endoscopy;  Laterality: N/A;   FRACTURE SURGERY     gall stone removal  05/2003   GASTRIC RESTRICTION SURGERY  1984   "stapeling"   HERNIA REPAIR  2004   "in my stomach; had OR on it twice", wire mesh on 1 hernia   LEFT HEART CATH  AND CORS/GRAFTS ANGIOGRAPHY N/A 07/09/2016   Procedure: LEFT HEART CATH AND CORS/GRAFTS ANGIOGRAPHY;  Surgeon: Larey Dresser, MD;  Location: [place] [facility] LAB;  Service: Cardiovascular;  Laterality: N/A;   ORIF ANKLE FRACTURE Left 05/16/2018   ORIF ANKLE FRACTURE Left 05/16/2018   Procedure: OPEN REDUCTION INTERNAL FIXATION (ORIF) Left ankle with possible syndesmosis fixation;  Surgeon: Nicholes Stairs, MD;  Location: [place] [place];  Service: Orthopedics;  Laterality: Left;  178min   OVARY SURGERY  1970's   "tumor removed"   PERCUTANEOUS CORONARY STENT INTERVENTION (PCI-S) N/A 06/13/2013   Procedure: PERCUTANEOUS CORONARY STENT INTERVENTION (PCI-S);  Surgeon: [name], MD;  Location[facility] CATH LAB;  Service: Cardiovascular;  Laterality: N/A;   PERCUTANEOUS STENT INTERVENTION N/A 04/24/2013   Procedure: PERCUTANEOUS STENT INTERVENTION;  Surgeon: [name], MD;  Location: [facility] CATH LAB;  Service: Cardiovascular;  Laterality: N/A;   RIGHT/LEFT HEART CATH AND CORONARY/GRAFT ANGIOGRAPHY N/A 10/20/2017   Procedure: RIGHT/LEFT HEART CATH AND CORONARY/GRAFT ANGIOGRAPHY;  Surgeon: Larey Dresser, MD;  Location: [place] CV LAB;  Service: Cardiovascular;  Laterality: N/A;   ROOT CANAL  10/2000   TIBIA FRACTURE SURGERY Right 1970's   rods and pins   TOOTH EXTRACTION     "1 on the upper; wisdom tooth on the lower" (01/16/2018)   TOTAL ABDOMINAL HYSTERECTOMY  1970's   w/ appendectomy     OB History   No obstetric history on file.      Home Medications    Prior to Admission medications   Medication Sig Start Date End Date Taking? Authorizing Provider  acetaminophen (TYLENOL) 500 MG tablet Take 1 tablet (500 mg total) by mouth every 6 (six) hours as needed for moderate pain or fever. Patient taking differently: Take 1,000 mg by mouth every 6 (six) hours as needed for moderate pain, fever or headache.  09/28/18  Yes Angiulli, Lavon Paganini, PA-C  albuterol (VENTOLIN HFA) 108  (90 Base) MCG/ACT inhaler Inhale 2 puffs into the lungs every 4 (four) hours as needed for wheezing or shortness of breath.    Yes [provider]  amLODipine (NORVASC) 5 MG tablet Take 1 tablet (5 mg total) by mouth daily. 10/06/18 01/04/19 Yes Larey Dresser, MD  apixaban (ELIQUIS) 5 MG TABS tablet Take 1 tablet (5 mg total) by mouth 2 (two) times daily. 09/28/18  Yes Angiulli, Lavon Paganini, PA-C  bisacodyl (DULCOLAX) 5 MG EC tablet Take 1 tablet (5 mg total) by mouth daily as needed for moderate constipation. 05/19/18 05/19/19 Yes Victorino December  Saralyn Pilar, MD  dextromethorphan-guaiFENesin [facility] DM) 30-600 MG 12hr tablet Take 1 tablet by mouth 2 (two) times daily. Patient taking differently: Take 1 tablet by mouth every morning.  09/28/18  Yes Angiulli, Lavon Paganini, PA-C  DULoxetine (CYMBALTA) 30 MG capsule Take 1 capsule (30 mg total) by mouth daily. 09/28/18  Yes Angiulli, Lavon Paganini, PA-C  Fluticasone-Salmeterol (ADVAIR) 250-50 MCG/DOSE AEPB Inhale 1 puff into the lungs daily.   Yes [provider]  gabapentin (NEURONTIN) 300 MG capsule Take 1 capsule (300 mg total) by mouth daily. Patient taking differently: Take 300-600 mg by mouth See admin instructions. Take two capsules (600 mg) by mouth every morning and one capsule (300 mg) at night; may also take one capsule (300 mg) midday as needed for nerve pain 09/28/18  Yes Angiulli, Lavon Paganini, PA-C  HYDROcodone-acetaminophen (NORCO/VICODIN) 5-325 MG tablet Take 1 tablet by mouth at bedtime as needed for moderate pain.   Yes [provider]  Insulin Glargine (LANTUS SOLOSTAR) 100 UNIT/ML Solostar Pen Inject 38-44 Units into the skin See admin instructions. Inject 38 units subcutaneously daily at bedtime, increase to 44 units for CBG >200   Yes [provider]  insulin lispro (HUMALOG KWIKPEN) 100 UNIT/ML KwikPen Inject 14-18 Units into the skin See admin instructions. Inject 14 units subcutaneously prior to breakfast and supper; add 4  units for CBG >200   Yes [provider]  metoprolol succinate (TOPROL-XL) 50 MG 24 hr tablet Take 1 tablet (50 mg total) by mouth daily. Take with or immediately following a meal. 09/28/18  Yes Angiulli, Lavon Paganini, PA-C  Multiple Vitamins-Minerals (OCUVITE EYE HEALTH FORMULA) CAPS Take 1 capsule by mouth every morning.   Yes [provider]  nitroGLYCERIN (NITROSTAT) 0.3 MG SL tablet Place 1 tablet (0.3 mg total) under the tongue every 5 (five) minutes x 3 doses as needed for chest pain. 09/15/17  Yes Georgiana Shore, NP  rosuvastatin (CRESTOR) 40 MG tablet Take 1 tablet (40 mg total) by mouth at bedtime. 09/28/18  Yes Angiulli, Lavon Paganini, PA-C  spironolactone (ALDACTONE) 25 MG tablet Take 1 tablet (25 mg total) by mouth daily. 11/13/18  Yes Larey Dresser, MD  ADVAIR [facility] 115-21 MCG/ACT inhaler Inhale 2 puffs into the lungs. 08/02/18   [provider]  Alirocumab (PRALUENT) 150 MG/ML SOAJ Inject 1 pen into the skin every 14 (fourteen) days. 08/18/18   Larey Dresser, MD  ALPRAZolam Duanne Moron) 0.5 MG tablet Take 1 tablet (0.5 mg total) by mouth 2 (two) times daily as needed for anxiety. 09/28/18   Angiulli, Lavon Paganini, PA-C  budesonide (PULMICORT) 0.25 MG/2ML nebulizer solution Take 2 mLs (0.25 mg total) by nebulization 2 (two) times daily. 04/10/18   Cherene Altes, MD  buPROPion (WELLBUTRIN XL) 150 MG 24 hr tablet Take 1 tablet (150 mg total) by mouth daily. 09/28/18   Angiulli, Lavon Paganini, PA-C  clopidogrel (PLAVIX) 75 MG tablet Take 1 tablet (75 mg total) by mouth at bedtime. 09/28/18   Angiulli, Lavon Paganini, PA-C  denosumab (PROLIA) 60 MG/ML SOSY injection Inject 69 mg into the skin every 6 (six) months.    [provider]  ezetimibe (ZETIA) 10 MG tablet Take 1 tablet (10 mg total) by mouth at bedtime. 04/10/18   Cherene Altes, MD  ipratropium-albuterol (DUONEB) 0.5-2.5 (3) MG/3ML SOLN Take 3 mLs by nebulization 2 (two) times daily. Patient taking differently: Take 3 mLs  by nebulization 2 (two) times daily as needed (shortness of breath).  04/10/18  Cherene Altes, MD  ketoconazole (NIZORAL) 2 % cream Apply 1 application topically daily. 10/12/18   Bayard Hugger, NP  metolazone (ZAROXOLYN) 2.5 MG tablet Take 1 tablet (2.5 mg total) by mouth daily. As directed by heart failure clinic. Call heart failure office for direction 11/24/18   Bensimhon, Shaune Pascal, MD  OXYGEN Inhale 2 L into the lungs at bedtime.     [provider]  pantoprazole (PROTONIX) 40 MG tablet Take 1 tablet (40 mg total) by mouth daily. 09/28/18   Angiulli, Lavon Paganini, PA-C  potassium chloride SA (K-DUR) 20 MEQ tablet TAKE ONE (1) TABLET BY MOUTH TWO (2) TIMES DAILY 09/12/18   Larey Dresser, MD  ranolazine (RANEXA) 500 MG 12 hr tablet Take 1 tablet (500 mg total) by mouth 2 (two) times daily. 09/28/18   Angiulli, Lavon Paganini, PA-C  torsemide (DEMADEX) 20 MG tablet Take 2 tablets (40 mg total) by mouth 2 (two) times daily for 2 days, THEN 2 tablets (40 mg total) daily. 11/08/18 12/10/18  Clegg, Amy D, NP  vitamin B-12 (CYANOCOBALAMIN) 1000 MCG tablet Take 1,000 mcg by mouth daily.    [provider]    Family History Family History  Problem Relation Age of Onset   Heart disease Father        Heart Disease before age 73   Diabetes Father    Hyperlipidemia Father    Hypertension Father    Heart attack Father    Deep vein thrombosis Father    AAA (abdominal aortic aneurysm) Father    Hypertension Mother    Deep vein thrombosis Mother    AAA (abdominal aortic aneurysm) Mother    Cancer Sister        Ovarian   Hypertension Sister    Diabetes Paternal Grandmother    Heart disease Paternal Uncle    Diabetes Paternal Uncle    Heart attack Paternal Grandfather 35       died of MI at 30   Diabetes Paternal Aunt    Diabetes Paternal Uncle    Colon cancer Neg Hx     Social History Social History   Tobacco Use   Smoking status: Never Smoker   Smokeless  tobacco: Never Used  Substance Use Topics   Alcohol use: Never    Frequency: Never   Drug use: Never     Allergies   Amoxicillin, Brilinta [ticagrelor], Erythromycin, Flagyl [metronidazole], Penicillins, Isosorbide mononitrate [isosorbide nitrate], Jardiance [empagliflozin], Metformin and related, Tape, and Erythromycin base   Review of Systems Review of Systems  Constitutional: Negative for chills, diaphoresis, fatigue and fever.  HENT: Negative for congestion.   Eyes: Negative for visual disturbance.  Respiratory: Negative for chest tightness, shortness of breath and wheezing.   Cardiovascular: Negative for chest pain, palpitations and leg swelling.  Gastrointestinal: Negative for abdominal pain, constipation, diarrhea, nausea and vomiting.  Genitourinary: Negative for dysuria.  Musculoskeletal: Negative for back pain, neck pain and neck stiffness.  Skin: Negative for rash and wound.  Neurological: Positive for dizziness, facial asymmetry, speech difficulty, weakness, numbness and headaches. Negative for seizures and light-headedness.  Psychiatric/Behavioral: Negative for agitation and confusion.  All other systems reviewed and are negative.    Physical Exam Updated Vital Signs BP 104/62 (BP Location: Right Arm)    Pulse (!) 58    Temp (!) 97.5 F (36.4 C) (Oral)    Resp 18    SpO2 97%   Physical Exam Vitals signs and nursing note reviewed.  Constitutional:  General: She is not in acute distress.    Appearance: She is well-developed. She is not ill-appearing, toxic-appearing or diaphoretic.  HENT:     Head: Normocephalic and atraumatic.     Right Ear: External ear normal.     Left Ear: External ear normal.     Nose: Nose normal. No congestion or rhinorrhea.     Mouth/Throat:     Mouth: Mucous membranes are moist.     Pharynx: No oropharyngeal exudate.  Eyes:     Conjunctiva/sclera: Conjunctivae normal.     Pupils: Pupils are equal, round, and reactive to  light.  Neck:     Musculoskeletal: Normal range of motion and neck supple. No muscular tenderness.  Cardiovascular:     Rate and Rhythm: Normal rate.     Pulses: Normal pulses.  Pulmonary:     Effort: Pulmonary effort is normal. No respiratory distress.     Breath sounds: No stridor. No wheezing, rhonchi or rales.  Chest:     Chest wall: No tenderness.  Abdominal:     General: Abdomen is flat. There is no distension.     Tenderness: There is no abdominal tenderness. There is no right CVA tenderness, left CVA tenderness or rebound.  Musculoskeletal:        General: No tenderness.     Right lower leg: No edema.     Left lower leg: No edema.  Skin:    General: Skin is warm.     Capillary Refill: Capillary refill takes less than 2 seconds.     Findings: No erythema or rash.  Neurological:     General: No focal deficit present.     Mental Status: She is alert and oriented to person, place, and time.     Cranial Nerves: No cranial nerve deficit.     Sensory: No sensory deficit.     Motor: No weakness or abnormal muscle tone.     Coordination: Coordination normal.     Deep Tendon Reflexes: Reflexes are normal and symmetric.  Psychiatric:        Mood and Affect: Mood normal.      ED Treatments / Results  Labs (all labs ordered are listed, but only abnormal results are displayed) Labs Reviewed  PROTIME-INR - Abnormal; Notable for the following components:      Result Value   Prothrombin Time 19.0 (*)    INR 1.6 (*)    All other components within normal limits  CBC - Abnormal; Notable for the following components:   Hemoglobin 10.1 (*)    HCT 34.7 (*)    MCH 24.4 (*)    MCHC 29.1 (*)    RDW 17.0 (*)    All other components within normal limits  COMPREHENSIVE METABOLIC PANEL - Abnormal; Notable for the following components:   Glucose, Bld 207 (*)    Creatinine, Ser 1.47 (*)    GFR calc non Af Amer 36 (*)    GFR calc Af Amer 41 (*)    All other components within normal  limits  APTT  DIFFERENTIAL  RAPID URINE DRUG SCREEN, HOSP PERFORMED    EKG EKG Interpretation  Date/Time:  Saturday December 23 2018 13:25:18 EST Ventricular Rate:  59 PR Interval:  162 QRS Duration: 182 QT Interval:  516 QTC Calculation: 510 R Axis:   149 Text Interpretation: Sinus bradycardia Right axis deviation Left bundle branch block Abnormal ECG When comapred to prior, slightly shorter QTC and similar t waves to prior.  No  STEMI Confirmed by Antony Blackbird [id number]) on 12/23/2018 2:23:13 PM   Radiology No results found.  Procedures Procedures (including critical care time)  Medications Ordered in ED Medications  ranolazine (RANEXA) 12 hr tablet 500 mg (has no administration in time range)  rosuvastatin (CRESTOR) tablet 40 mg (has no administration in time range)  ALPRAZolam (XANAX) tablet 0.5-1 mg (has no administration in time range)  buPROPion (WELLBUTRIN XL) 24 hr tablet 150 mg (has no administration in time range)  DULoxetine (CYMBALTA) DR capsule 30 mg (has no administration in time range)  zolpidem (AMBIEN) tablet 5 mg (has no administration in time range)  Insulin Glargine (LANTUS) Solostar Pen 38-44 Units (has no administration in time range)  bisacodyl (DULCOLAX) EC tablet 5 mg (has no administration in time range)  pantoprazole (PROTONIX) EC tablet 40 mg (has no administration in time range)  apixaban (ELIQUIS) tablet 5 mg (has no administration in time range)  clopidogrel (PLAVIX) tablet 75 mg (has no administration in time range)  gabapentin (NEURONTIN) capsule 300-600 mg (has no administration in time range)  Ocuvite Eye Health Formula CAPS 1 capsule (has no administration in time range)  albuterol (VENTOLIN HFA) 108 (90 Base) MCG/ACT inhaler 2 puff (has no administration in time range)  budesonide (PULMICORT) nebulizer solution 0.25 mg (has no administration in time range)  mometasone-formoterol (DULERA) 200-5 MCG/ACT inhaler 2 puff (has no administration  in time range)  ipratropium-albuterol (DUONEB) 0.5-2.5 (3) MG/3ML nebulizer solution 3 mL (has no administration in time range)  insulin aspart (novoLOG) injection 0-15 Units (has no administration in time range)   stroke: mapping our early stages of recovery book (has no administration in time range)  0.9 %  sodium chloride infusion (has no administration in time range)  acetaminophen (TYLENOL) tablet 650 mg (has no administration in time range)    Or  acetaminophen (TYLENOL) 160 MG/5ML solution 650 mg (has no administration in time range)    Or  acetaminophen (TYLENOL) suppository 650 mg (has no administration in time range)  senna-docusate (Senokot-S) tablet 1 tablet (has no administration in time range)  aspirin suppository 300 mg (has no administration in time range)    Or  aspirin tablet 325 mg (has no administration in time range)  insulin aspart (novoLOG) injection 0-5 Units (has no administration in time range)     Initial Impression / Assessment and Plan / ED Course  I have reviewed the triage vital signs and the nursing notes.  Pertinent labs & imaging results that were available during my care of the patient were reviewed by me and considered in my medical decision making (see chart for details).        QAMAR ROSMAN is a 73 y.o. female with a past medical history significant for prior stroke, carotid disease status post enterectomy and stents, hypertension, hyperlipidemia, CHF, diabetes, CAD with MI, prior cerebral aneurysm, and CKD who presents with TIA symptoms.  Patient reports that over the last 10 days, she has had 7 days of TIAs.  She called her neurologist today who told her to come to the emergency department for evaluation.  She reports that her episodes are lasting for period of time with speech difficulty, left facial droop, weakness in her left arm and left leg with numbness, and difficulty with ambulation.  She reports he is her similar symptoms to when she had  prior stroke.  She denies recent fevers, chills, chest pain or shortness of breath currently.  She denies urinary symptoms or GI  symptoms.  No recent trauma.  She reports occasional headaches which are similar to prior.  On exam, patient has clear speech and has no facial droop.  Normal extraocular movements.  Pupils are symmetric and reactive.  Patient is alert and oriented.  Patient normal finger-nose-finger testing bilaterally, normal sensation in upper extremities, normal grip strength.  Patient did report some numbness in her left leg which she reports is chronic after a leg surgery.  No weakness in the legs.  Neck was nontender.  I did not appreciate bruits on my initial exam.  Lungs clear chest nontender.  Clinically I am concerned that patient may be having TIAs.   I spoke with Dr. Cheral Marker with neurology who recommended admission to medicine service for TIA work-up as well as MRI of the brain, MRA of the brain, and carotid Dopplers.  She will likely also need EEG.  We will call for admission for further management of likely TIAs.   Final Clinical Impressions(s) / ED Diagnoses   Final diagnoses:  TIA (transient ischemic attack)    Clinical Impression: 1. TIA (transient ischemic attack)     Disposition: Admit  This note was prepared with assistance of Dragon voice recognition software. Occasional wrong-word or sound-a-like substitutions may have occurred due to the inherent limitations of voice recognition software.     Makaylie Dedeaux, Gwenyth Allegra, MD 12/23/18 [phone]

## 2018-12-23 NOTE — ED Notes (Signed)
ED TO INPATIENT HANDOFF REPORT  ED Nurse Name and Phone #: Jakeline Dave [phone]  S Name/Age/Gender [name] 73 y.o. female Room/Bed: H019C/H019C  Code Status   Code Status: Full Code  Home/SNF/Other Home Patient oriented to: self, place and situation Is this baseline? Yes   Triage Complete: Triage complete  Chief Complaint tia sym  Triage Note Reports she is having TIAs 7 out of last 10 days.  States she doesn't remember what happens but her daughter states she is having unsteady gait and slurred speech.  Denies any symptoms at present except she feels like her L eyelid is drooping.   Allergies Allergies  Allergen Reactions  . Amoxicillin Shortness Of Breath and Rash  . Brilinta [Ticagrelor] Shortness Of Breath  . Erythromycin Shortness Of Breath, Other (See Comments) and Hives    Trouble swallowing  . Flagyl [Metronidazole] Shortness Of Breath and Palpitations  . Penicillins Hives, Shortness Of Breath, Rash and Other (See Comments)    Has patient had a PCN reaction causing immediate rash, facial/tongue/throat swelling, SOB or lightheadedness with hypotension: Yes Has patient had a PCN reaction causing severe rash involving mucus membranes or skin necrosis: No Has patient had a PCN reaction that required hospitalization: Yes Has patient had a PCN reaction occurring within the last 10 years: No If all of the above answers are "NO", then may proceed with Cephalosporin use.   . Isosorbide Mononitrate [Isosorbide Nitrate] Other (See Comments)    Joint aches, muscles hurt, difficult to walk   . Jardiance [Empagliflozin] Other (See Comments)    Nausea, joint aches, muscles aches  . Metformin And Related Other (See Comments)    Stomach pain, cold sweats, joint pain, burred vision, dizziness  . Tape Other (See Comments)    Skin pulls off with certain types Plastic tape causes skin to rip if left on for long periods of time  . Erythromycin Base Rash    Level of  Care/Admitting Diagnosis ED Disposition    ED Disposition Condition Comment   Admit  Hospital Area: [facility] [[id number]]  Level of Care: Telemetry Medical [104]  I expect the patient will be discharged within 24 hours: Yes  LOW acuity---Tx typically complete <24 hrs---ACUTE conditions typically can be evaluated <24 hours---LABS likely to return to acceptable levels <24 hours---IS near functional baseline---EXPECTED to return to current living arrangement---NOT newly hypoxic: Meets criteria for 5C-Observation unit  Covid Evaluation: Asymptomatic Screening Protocol (No Symptoms)  Diagnosis: TIA (transient ischemic attack) [300762]  Admitting Physician: Karmen Bongo [[id number]]  Attending Physician: Karmen Bongo [[id number]]  PT Class (Do Not Modify): Observation [104]  PT Acc Code (Do Not Modify): Observation [10022]       B Medical/Surgery History Past Medical History:  Diagnosis Date  . Anemia    hx  . Anxiety   . Asthma   . Basal cell carcinoma 05/2014   "left shoulder"  . Bundle branch block, left    chronic/notes 07/18/2013  . CHF (congestive heart failure) ([place])   . Chronic insomnia 05/06/2015  . Chronic kidney disease    frequency, sees dr Jamal Maes every 4 to 6 months (01/16/2018)  . Chronic lower back pain   . Claustrophobia   . Common migraine 05/14/2014  . Coronary artery disease    MI in 2001, 2002, 2006, 2011, 2014  . Depression   . GERD (gastroesophageal reflux disease)   . H/O hiatal hernia   . Headache    "at least 2/month" (01/16/2018)  .  Heart murmur   . Hyperlipidemia   . Hypertension   . Memory change 05/14/2014  . Migraine    "5-6/year"  (01/16/2018)  . Obesity 01-2010  . Obstructive sleep apnea    "can't wear machine; I have claustrophobia" (01/16/2018), states she had a 2nd sleep study and does not have sleep apnea, her O2 decreases and now is on 2 L of O2 at night.  . On home oxygen therapy    "2L at night and prn during daytime"  (01/16/2018)  . Osteoarthritis    "knees and hands" (01/16/2018)  . Peripheral vascular disease (HCC)    ? numbness, tingling arms and legs  . PONV (postoperative nausea and vomiting)   . Stroke [facility])    2014, 2015, 2016  . Type II diabetes mellitus ([place])   . Ventral hernia    hx of   Past Surgical History:  Procedure Laterality Date  . ANKLE FRACTURE SURGERY Left 1970's  . APPENDECTOMY  1970's   w/hysterectomy  . BASAL CELL CARCINOMA EXCISION Left 05/2014   "shoulder" (01/16/2018)  . CARDIAC CATHETERIZATION  10/10/2012   Dr [name].  Marland Kitchen CARDIAC CATHETERIZATION N/A 05/29/2015   Procedure: Right/Left Heart Cath and Coronary/Graft Angiography;  Surgeon: Larey Dresser, MD;  Location: [facility];  Service: Cardiovascular;  Laterality: N/A;  . CAROTID ENDARTERECTOMY Left 03/2013  . CAROTID STENT INSERTION Left 03/20/2013   Procedure: CAROTID STENT INSERTION;  Surgeon: [name], MD;  Location: [facility] CATH LAB;  Service: Cardiovascular;  Laterality: Left;  internal carotid  . CEREBRAL ANGIOGRAM N/A 04/05/2011   Procedure: CEREBRAL ANGIOGRAM;  Surgeon: Angelia Mould, MD;  Location: [facility] CATH LAB;  Service: Cardiovascular;  Laterality: N/A;  . CHOLECYSTECTOMY OPEN  2004  . CORONARY ANGIOPLASTY WITH STENT PLACEMENT  01,02,05,06,07,08,11; 04/24/2013   "I've probably got ~ 10 stents by now" (04/24/2013)  . CORONARY ANGIOPLASTY WITH STENT PLACEMENT  06/13/2013   "got 4 stents today" (06/13/2013)  . CORONARY ARTERY BYPASS GRAFT  1220/11   "CABG X5"  . CORONARY STENT INTERVENTION N/A 10/20/2017   Procedure: CORONARY STENT INTERVENTION;  Surgeon: Troy Sine, MD;  Location: [facility] LAB;  Service: Cardiovascular;  Laterality: N/A;  . ESOPHAGOGASTRODUODENOSCOPY  08/03/2011   Procedure: ESOPHAGOGASTRODUODENOSCOPY (EGD);  Surgeon: Shann Medal, MD;  Location: [facility];  Service: General;  Laterality: N/A;  . ESOPHAGOGASTRODUODENOSCOPY (EGD) WITH PROPOFOL N/A 03/11/2014    Procedure: ESOPHAGOGASTRODUODENOSCOPY (EGD) WITH PROPOFOL;  Surgeon: Lafayette Dragon, MD;  Location: [facility] ENDOSCOPY;  Service: Endoscopy;  Laterality: N/A;  . FRACTURE SURGERY    . gall stone removal  05/2003  . GASTRIC RESTRICTION SURGERY  1984   "stapeling"  . HERNIA REPAIR  2004   "in my stomach; had OR on it twice", wire mesh on 1 hernia  . LEFT HEART CATH AND CORS/GRAFTS ANGIOGRAPHY N/A 07/09/2016   Procedure: LEFT HEART CATH AND CORS/GRAFTS ANGIOGRAPHY;  Surgeon: Larey Dresser, MD;  Location: [facility];  Service: Cardiovascular;  Laterality: N/A;  . ORIF ANKLE FRACTURE Left 05/16/2018  . ORIF ANKLE FRACTURE Left 05/16/2018   Procedure: OPEN REDUCTION INTERNAL FIXATION (ORIF) Left ankle with possible syndesmosis fixation;  Surgeon: Nicholes Stairs, MD;  Location: [facility];  Service: Orthopedics;  Laterality: Left;  160min  . OVARY SURGERY  1970's   "tumor removed"  . PERCUTANEOUS CORONARY STENT INTERVENTION (PCI-S) N/A 06/13/2013   Procedure: PERCUTANEOUS CORONARY STENT INTERVENTION (PCI-S);  Surgeon: [name], MD;  Location:  [facility] CATH LAB;  Service: Cardiovascular;  Laterality: N/A;  . PERCUTANEOUS STENT INTERVENTION N/A 04/24/2013   Procedure: PERCUTANEOUS STENT INTERVENTION;  Surgeon: [name], MD;  Location: [facility] CATH LAB;  Service: Cardiovascular;  Laterality: N/A;  . RIGHT/LEFT HEART CATH AND CORONARY/GRAFT ANGIOGRAPHY N/A 10/20/2017   Procedure: RIGHT/LEFT HEART CATH AND CORONARY/GRAFT ANGIOGRAPHY;  Surgeon: Larey Dresser, MD;  Location: [facility] LAB;  Service: Cardiovascular;  Laterality: N/A;  . ROOT CANAL  10/2000  . TIBIA FRACTURE SURGERY Right 1970's   rods and pins  . TOOTH EXTRACTION     "1 on the upper; wisdom tooth on the lower" (01/16/2018)  . TOTAL ABDOMINAL HYSTERECTOMY  1970's   w/ appendectomy     A IV Location/Drains/Wounds Patient Lines/Drains/Airways Status   Active Line/Drains/Airways    Name:   Placement date:   Placement  time:   Site:   Days:   Peripheral IV 12/23/18 Right;Anterior Forearm   12/23/18    1936    Forearm   less than 1   Pressure Injury 09/18/18 Buttocks Right;Posterior Stage II -  Partial thickness loss of dermis presenting as a shallow open ulcer with a red, pink wound bed without slough.   09/18/18    0115     96          Intake/Output Last 24 hours No intake or output data in the 24 hours ending 12/23/18 1944  Labs/Imaging Results for orders placed or performed during the hospital encounter of 12/23/18 (from the past 48 hour(s))  Protime-INR     Status: Abnormal   Collection Time: 12/23/18  1:39 PM  Result Value Ref Range   Prothrombin Time 19.0 (H) 11.4 - 15.2 seconds   INR 1.6 (H) 0.8 - 1.2    Comment: (NOTE) INR goal varies based on device and disease states. Performed at [facility], [place] [address] [place], [place] [address]   APTT     Status: None   Collection Time: 12/23/18  1:39 PM  Result Value Ref Range   aPTT 35 24 - 36 seconds    Comment: Performed at [facility] [address], [place], [place] [address]  CBC     Status: Abnormal   Collection Time: 12/23/18  1:39 PM  Result Value Ref Range   WBC 9.2 4.0 - 10.5 K/uL   RBC 4.14 3.87 - 5.11 MIL/uL   Hemoglobin 10.1 (L) 12.0 - 15.0 g/dL   HCT 34.7 (L) 36.0 - 46.0 %   MCV 83.8 80.0 - 100.0 fL   MCH 24.4 (L) 26.0 - 34.0 pg   MCHC 29.1 (L) 30.0 - 36.0 g/dL   RDW 17.0 (H) 11.5 - 15.5 %   Platelets 203 150 - 400 K/uL   nRBC 0.0 0.0 - 0.2 %    Comment: Performed at [facility], [place] [address], [place], [place] [address]  Differential     Status: None   Collection Time: 12/23/18  1:39 PM  Result Value Ref Range   Neutrophils Relative % 73 %   Neutro Abs 6.8 1.7 - 7.7 K/uL   Lymphocytes Relative 12 %   Lymphs Abs 1.1 0.7 - 4.0 K/uL   Monocytes Relative 8 %   Monocytes Absolute 0.8 0.1 - 1.0 K/uL   Eosinophils Relative 6 %   Eosinophils Absolute 0.5 0.0 - 0.5 K/uL   Basophils Relative 1 %    Basophils Absolute 0.1 0.0 - 0.1 K/uL  Immature Granulocytes 0 %   Abs Immature Granulocytes 0.03 0.00 - 0.07 K/uL    Comment: Performed at [facility], [place] [address] [place], [place] [address]  Comprehensive metabolic panel     Status: Abnormal   Collection Time: 12/23/18  1:39 PM  Result Value Ref Range   Sodium 138 135 - 145 mmol/L   Potassium 3.8 3.5 - 5.1 mmol/L   Chloride 98 98 - 111 mmol/L   CO2 31 22 - 32 mmol/L   Glucose, Bld 207 (H) 70 - 99 mg/dL   BUN 19 8 - 23 mg/dL   Creatinine, Ser 1.47 (H) 0.44 - 1.00 mg/dL   Calcium 9.3 8.9 - 10.3 mg/dL   Total Protein 6.7 6.5 - 8.1 g/dL   Albumin 3.5 3.5 - 5.0 g/dL   AST 23 15 - 41 U/L   ALT 17 0 - 44 U/L   Alkaline Phosphatase 84 38 - 126 U/L   Total Bilirubin 0.5 0.3 - 1.2 mg/dL   GFR calc non Af Amer 36 (L) >60 mL/min   GFR calc Af Amer 41 (L) >60 mL/min   Anion gap 9 5 - 15    Comment: Performed at [facility] [address] [place], [place] [address]  CBG monitoring, ED     Status: Abnormal   Collection Time: 12/23/18  4:35 PM  Result Value Ref Range   Glucose-Capillary 120 (H) 70 - 99 mg/dL  CBG monitoring, ED     Status: Abnormal   Collection Time: 12/23/18  6:23 PM  Result Value Ref Range   Glucose-Capillary 111 (H) 70 - 99 mg/dL   *Note: Due to a large number of results and/or encounters for the requested time period, some results have not been displayed. A complete set of results can be found in Results Review.   Mr Angio Head Wo Contrast  Result Date: 12/23/2018 CLINICAL DATA:  TIA EXAM: MRI HEAD WITHOUT CONTRAST MRA HEAD WITHOUT CONTRAST TECHNIQUE: Multiplanar, multiecho pulse sequences of the brain and surrounding structures were obtained without intravenous contrast. Angiographic images of the head were obtained using MRA technique without contrast. COMPARISON:  CT head 09/18/2018 FINDINGS: MRI HEAD FINDINGS Brain: Negative for acute infarct Chronic right posterior cerebral artery infarct  involving the inferior right occipital lobe. Chronic ischemic change in the white matter. Scattered areas of chronic microhemorrhage in the left cerebral hemisphere. Vascular: Normal arterial flow voids. Skull and upper cervical spine: No focal bone lesion. Anterolisthesis C3-4 with spurring and spinal stenosis. Cord flattening. Sinuses/Orbits: Negative Other: None MRA HEAD FINDINGS Both vertebral arteries patent to the basilar. PICA patent bilaterally with scattered atherosclerotic disease. Mild to moderate stenosis in the proximal and mid basilar. Superior cerebellar and posterior cerebral arteries patent bilaterally. Multiple areas of moderate stenosis in the distal posterior cerebral artery on the right. Extensive atherosclerotic irregularity and moderate stenosis throughout the cavernous carotid bilaterally. Moderate stenosis in the A2 on the right. Mild stenosis left A2. Middle cerebral arteries patent without significant stenosis or occlusion. Negative for cerebral aneurysm. IMPRESSION: Negative for acute infarct Chronic right PCA infarct.  Chronic ischemia in the white matter Extensive intracranial atherosclerotic disease. Electronically Signed   By: Franchot Gallo M.D.   On: 12/23/2018 17:46   Mr Brain Wo Contrast  Result Date: 12/23/2018 CLINICAL DATA:  TIA EXAM: MRI HEAD WITHOUT CONTRAST MRA HEAD WITHOUT CONTRAST TECHNIQUE: Multiplanar, multiecho pulse sequences of the brain and surrounding structures were obtained without intravenous contrast. Angiographic images  of the head were obtained using MRA technique without contrast. COMPARISON:  CT head 09/18/2018 FINDINGS: MRI HEAD FINDINGS Brain: Negative for acute infarct Chronic right posterior cerebral artery infarct involving the inferior right occipital lobe. Chronic ischemic change in the white matter. Scattered areas of chronic microhemorrhage in the left cerebral hemisphere. Vascular: Normal arterial flow voids. Skull and upper cervical spine: No  focal bone lesion. Anterolisthesis C3-4 with spurring and spinal stenosis. Cord flattening. Sinuses/Orbits: Negative Other: None MRA HEAD FINDINGS Both vertebral arteries patent to the basilar. PICA patent bilaterally with scattered atherosclerotic disease. Mild to moderate stenosis in the proximal and mid basilar. Superior cerebellar and posterior cerebral arteries patent bilaterally. Multiple areas of moderate stenosis in the distal posterior cerebral artery on the right. Extensive atherosclerotic irregularity and moderate stenosis throughout the cavernous carotid bilaterally. Moderate stenosis in the A2 on the right. Mild stenosis left A2. Middle cerebral arteries patent without significant stenosis or occlusion. Negative for cerebral aneurysm. IMPRESSION: Negative for acute infarct Chronic right PCA infarct.  Chronic ischemia in the white matter Extensive intracranial atherosclerotic disease. Electronically Signed   By: Franchot Gallo M.D.   On: 12/23/2018 17:46    Pending Labs Unresulted Labs (From admission, onward)    Start     Ordered   12/24/18 0500  Lipid panel  Tomorrow morning,   R    Comments: Fasting    12/23/18 1623   12/23/18 1621  Urine rapid drug screen (hosp performed)not at [facility]  Once,   STAT     12/23/18 1623          Vitals/Pain Today's Vitals   12/23/18 1515 12/23/18 1529 12/23/18 1530 12/23/18 1841  BP: (!) 149/66   (!) 138/47  Pulse: (!) 55  (!) 56 (!) 59  Resp: 16  17 18   Temp:      TempSrc:      SpO2: 95%  97% 95%  PainSc:  4       Isolation Precautions No active isolations  Medications Medications  ranolazine (RANEXA) 12 hr tablet 500 mg (has no administration in time range)  rosuvastatin (CRESTOR) tablet 40 mg (has no administration in time range)  ALPRAZolam (XANAX) tablet 0.5 mg (has no administration in time range)  buPROPion (WELLBUTRIN XL) 24 hr tablet 150 mg (has no administration in time range)  DULoxetine (CYMBALTA) DR capsule 30 mg (has no  administration in time range)  zolpidem (AMBIEN) tablet 5 mg (has no administration in time range)  Insulin Glargine (LANTUS) Solostar Pen 38-44 Units (has no administration in time range)  bisacodyl (DULCOLAX) EC tablet 5 mg (has no administration in time range)  pantoprazole (PROTONIX) EC tablet 40 mg (has no administration in time range)  apixaban (ELIQUIS) tablet 5 mg (has no administration in time range)  clopidogrel (PLAVIX) tablet 75 mg (has no administration in time range)  gabapentin (NEURONTIN) capsule 600 mg (has no administration in time range)  Ocuvite Eye Health Formula CAPS 1 capsule (has no administration in time range)  albuterol (VENTOLIN HFA) 108 (90 Base) MCG/ACT inhaler 2 puff (has no administration in time range)  budesonide (PULMICORT) nebulizer solution 0.25 mg (has no administration in time range)  mometasone-formoterol (DULERA) 200-5 MCG/ACT inhaler 2 puff (2 puffs Inhalation Not Given 12/23/18 1937)  ipratropium-albuterol (DUONEB) 0.5-2.5 (3) MG/3ML nebulizer solution 3 mL (has no administration in time range)  insulin aspart (novoLOG) injection 0-15 Units (0 Units Subcutaneous Not Given 12/23/18 1841)   stroke: mapping our early stages of recovery  book (has no administration in time range)  0.9 %  sodium chloride infusion (has no administration in time range)  acetaminophen (TYLENOL) tablet 650 mg (has no administration in time range)    Or  acetaminophen (TYLENOL) 160 MG/5ML solution 650 mg (has no administration in time range)    Or  acetaminophen (TYLENOL) suppository 650 mg (has no administration in time range)  senna-docusate (Senokot-S) tablet 1 tablet (has no administration in time range)  aspirin suppository 150 mg ( Rectal See Alternative 12/23/18 1847)    Or  aspirin chewable tablet 81 mg (81 mg Oral Given 12/23/18 1847)  insulin aspart (novoLOG) injection 0-5 Units (has no administration in time range)  gabapentin (NEURONTIN) capsule 300 mg (has no  administration in time range)  gabapentin (NEURONTIN) capsule 300 mg (has no administration in time range)  ALPRAZolam (XANAX) tablet 1 mg (has no administration in time range)  ALPRAZolam (XANAX) tablet 0.5 mg (has no administration in time range)    Mobility walks with device High fall risk   Focused Assessments Neuro Assessment Handoff:  Swallow screen pass? Yes    NIH Stroke Scale ( + Modified Stroke Scale Criteria)  Interval: Initial Level of Consciousness (1a.)   : Alert, keenly responsive LOC Questions (1b. )   +: Answers both questions correctly LOC Commands (1c. )   + : Performs both tasks correctly Best Gaze (2. )  +: Normal Visual (3. )  +: No visual loss Facial Palsy (4. )    : Normal symmetrical movements Motor Arm, Left (5a. )   +: No drift Motor Arm, Right (5b. )   +: No drift Motor Leg, Left (6a. )   +: No drift Motor Leg, Right (6b. )   +: No drift Limb Ataxia (7. ): Absent Sensory (8. )   +: Normal, no sensory loss Best Language (9. )   +: No aphasia Dysarthria (10. ): Normal Extinction/Inattention (11.)   +: No Abnormality Modified SS Total  +: 0 Complete NIHSS TOTAL: 0     Neuro Assessment: Exceptions to WDL Neuro Checks:   Initial (12/23/18 1533)  Last Documented NIHSS Modified Score: 0 (12/23/18 1533) Has TPA been given? No If patient is a Neuro Trauma and patient is going to OR before floor call report to No Name nurse: [phone] or [phone]     R Recommendations: See Admitting Provider Note  Report given to:   Additional Notes:

## 2018-12-23 NOTE — ED Notes (Signed)
Pt to MRI

## 2018-12-23 NOTE — Progress Notes (Signed)
Pt admitted to the unit from ED; pt alert and verbally responsive; VSS; telemetry applied and verified with CCMD; NT called to second verify. Pt oriented to the unit and room; safety precaution and prevention education completed with pt; pt voices understanding and denies any question. Pt in bed with call light within reach and bed alarm on. [name] [name] Tekeisha Hakim RN   12/23/18 2025  Vitals  Temp 98 F (36.7 C)  Temp Source Oral  BP (!) 119/57  MAP (mmHg) 72  BP Location Right Arm  BP Method Automatic  Patient Position (if appropriate) Lying  Pulse Rate (!) 59  Pulse Rate Source Monitor  Resp 18  Level of Consciousness  Level of Consciousness Alert  Oxygen Therapy  SpO2 95 %  O2 Device Room Air  Height and Weight  Height 5\' 2"  (1.575 m)  Weight 103.7 kg  Type of Scale Used Bed  Type of Weight Actual  BSA (Calculated - sq m) 2.13 sq meters  BMI (Calculated) 41.8  Weight in (lb) to have BMI = 25 136.4  MEWS Score  MEWS RR 0  MEWS Pulse 0  MEWS Systolic 0  MEWS LOC 0  MEWS Temp 0  MEWS Score 0  MEWS Score Color Green

## 2018-12-23 NOTE — Consult Note (Addendum)
[place]   Requesting Physician: Dr. Sherry Ruffing    Chief Complaint: Unsteady gait and slurred speech  History obtained from:  Patient and Daughter  HPI:                                                                                                                                         [name] [name] is a 73 y.o. female with a PMHx significant for DM2, PVD, CVA (2014, 2015, 2016), migraine, HTN, HLD, BCC (2016), CHF and CAD who presented to the [facility] ED with c/o unsteady gait and slurred speech. Family and patient then described more complex events labeled by them as TIA's, stating that she had approximately 9 such TIAs in the past 10 days. The events have stereotyped features, but vary significantly in duration and are associated with complex behaviors.   Per daughter the patient has been having spells that are milder than those she is presenting with today, for many years. During these milder spells, which the daughter terms "ghost strokes" her mother will stare off into space, tap her feet and not respond verbally. During the "ghost strokes", which vary in duration from 15 minutes to several hours, she is able to perform movements like sit down and maintains postural control, but she does not talk.   She also has more recent onset of more severe spells, with the same symptoms as above in addition to facial drooping on the left side, as well as visual hallucinations. The hallucinations are of people who are dead or just are not in the room. The patient will make reaching out movements while hallucinating during these more severe spells.   Of note, she sometimes acts out her dreams and talks in her sleep. This will sometimes wake her up at night. She recalls waking up from making grabbing motions in her sleep. Denies any SOB, chewing or swallowing problems. Endorses CP that she describes as chest pressure, but states it "doesn't feel like  her heart attack pain". Walks with a walker at baseline  ED course:  MRI brain: Negative for acute infarct. Chronic right PCA infarct.  Chronic ischemia in the white matter. MRA head: Extensive intracranial atherosclerotic disease. BG: 111 BP: 104/62  She sees Dr. Jannifer Franklin for peripheral neuropathic symptoms and gait problems. Per Dr. Jannifer Franklin' note from 07/2017 she had gait instability.     Past Medical History:  Diagnosis Date  . Anemia    hx  . Anxiety   . Asthma   . Basal cell carcinoma 05/2014   "left shoulder"  . Bundle branch block, left    chronic/notes 07/18/2013  .  CHF (congestive heart failure) ([place])   . Chronic insomnia 05/06/2015  . Chronic kidney disease    frequency, sees dr Jamal Maes every 4 to 6 months (01/16/2018)  . Chronic lower back pain   . Claustrophobia   . Common migraine 05/14/2014  . Coronary artery disease    MI in 2001, 2002, 2006, 2011, 2014  . Depression   . GERD (gastroesophageal reflux disease)   . H/O hiatal hernia   . Headache    "at least 2/month" (01/16/2018)  . Heart murmur   . Hyperlipidemia   . Hypertension   . Memory change 05/14/2014  . Migraine    "5-6/year"  (01/16/2018)  . Obesity 01-2010  . Obstructive sleep apnea    "can't wear machine; I have claustrophobia" (01/16/2018), states she had a 2nd sleep study and does not have sleep apnea, her O2 decreases and now is on 2 L of O2 at night.  . On home oxygen therapy    "2L at night and prn during daytime" (01/16/2018)  . Osteoarthritis    "knees and hands" (01/16/2018)  . Peripheral vascular disease (HCC)    ? numbness, tingling arms and legs  . PONV (postoperative nausea and vomiting)   . Stroke [facility])    2014, 2015, 2016  . Type II diabetes mellitus ([place])   . Ventral hernia    hx of    Past Surgical History:  Procedure Laterality Date  . ANKLE FRACTURE SURGERY Left 1970's  . APPENDECTOMY  1970's   w/hysterectomy  . BASAL CELL CARCINOMA EXCISION Left 05/2014    "shoulder" (01/16/2018)  . CARDIAC CATHETERIZATION  10/10/2012   Dr [name].  Marland Kitchen CARDIAC CATHETERIZATION N/A 05/29/2015   Procedure: Right/Left Heart Cath and Coronary/Graft Angiography;  Surgeon: Larey Dresser, MD;  Location: [place] CV LAB;  Service: Cardiovascular;  Laterality: N/A;  . CAROTID ENDARTERECTOMY Left 03/2013  . CAROTID STENT INSERTION Left 03/20/2013   Procedure: CAROTID STENT INSERTION;  Surgeon: [name], MD;  Location: [facility] CATH LAB;  Service: Cardiovascular;  Laterality: Left;  internal carotid  . CEREBRAL ANGIOGRAM N/A 04/05/2011   Procedure: CEREBRAL ANGIOGRAM;  Surgeon: Angelia Mould, MD;  Location: [facility] CATH LAB;  Service: Cardiovascular;  Laterality: N/A;  . CHOLECYSTECTOMY OPEN  2004  . CORONARY ANGIOPLASTY WITH STENT PLACEMENT  01,02,05,06,07,08,11; 04/24/2013   "I've probably got ~ 10 stents by now" (04/24/2013)  . CORONARY ANGIOPLASTY WITH STENT PLACEMENT  06/13/2013   "got 4 stents today" (06/13/2013)  . CORONARY ARTERY BYPASS GRAFT  1220/11   "CABG X5"  . CORONARY STENT INTERVENTION N/A 10/20/2017   Procedure: CORONARY STENT INTERVENTION;  Surgeon: Troy Sine, MD;  Location: [place] CV LAB;  Service: Cardiovascular;  Laterality: N/A;  . ESOPHAGOGASTRODUODENOSCOPY  08/03/2011   Procedure: ESOPHAGOGASTRODUODENOSCOPY (EGD);  Surgeon: Shann Medal, MD;  Location: [facility];  Service: General;  Laterality: N/A;  . ESOPHAGOGASTRODUODENOSCOPY (EGD) WITH PROPOFOL N/A 03/11/2014   Procedure: ESOPHAGOGASTRODUODENOSCOPY (EGD) WITH PROPOFOL;  Surgeon: Lafayette Dragon, MD;  Location: [facility] ENDOSCOPY;  Service: Endoscopy;  Laterality: N/A;  . FRACTURE SURGERY    . gall stone removal  05/2003  . GASTRIC RESTRICTION SURGERY  1984   "stapeling"  . HERNIA REPAIR  2004   "in my stomach; had OR on it twice", wire mesh on 1 hernia  . LEFT HEART CATH AND CORS/GRAFTS ANGIOGRAPHY N/A 07/09/2016   Procedure: LEFT HEART CATH AND CORS/GRAFTS ANGIOGRAPHY;  Surgeon: Larey Dresser, MD;  Location:  [place] INVASIVE CV LAB;  Service: Cardiovascular;  Laterality: N/A;  . ORIF ANKLE FRACTURE Left 05/16/2018  . ORIF ANKLE FRACTURE Left 05/16/2018   Procedure: OPEN REDUCTION INTERNAL FIXATION (ORIF) Left ankle with possible syndesmosis fixation;  Surgeon: Nicholes Stairs, MD;  Location: [place];  Service: Orthopedics;  Laterality: Left;  143min  . OVARY SURGERY  1970's   "tumor removed"  . PERCUTANEOUS CORONARY STENT INTERVENTION (PCI-S) N/A 06/13/2013   Procedure: PERCUTANEOUS CORONARY STENT INTERVENTION (PCI-S);  Surgeon: [name], MD;  Location: [facility] CATH LAB;  Service: Cardiovascular;  Laterality: N/A;  . PERCUTANEOUS STENT INTERVENTION N/A 04/24/2013   Procedure: PERCUTANEOUS STENT INTERVENTION;  Surgeon: [name], MD;  Location: [facility] CATH LAB;  Service: Cardiovascular;  Laterality: N/A;  . RIGHT/LEFT HEART CATH AND CORONARY/GRAFT ANGIOGRAPHY N/A 10/20/2017   Procedure: RIGHT/LEFT HEART CATH AND CORONARY/GRAFT ANGIOGRAPHY;  Surgeon: Larey Dresser, MD;  Location: [place] CV LAB;  Service: Cardiovascular;  Laterality: N/A;  . ROOT CANAL  10/2000  . TIBIA FRACTURE SURGERY Right 1970's   rods and pins  . TOOTH EXTRACTION     "1 on the upper; wisdom tooth on the lower" (01/16/2018)  . TOTAL ABDOMINAL HYSTERECTOMY  1970's   w/ appendectomy    Family History  Problem Relation Age of Onset  . Heart disease Father        Heart Disease before age 35  . Diabetes Father   . Hyperlipidemia Father   . Hypertension Father   . Heart attack Father   . Deep vein thrombosis Father   . AAA (abdominal aortic aneurysm) Father   . Hypertension Mother   . Deep vein thrombosis Mother   . AAA (abdominal aortic aneurysm) Mother   . Cancer Sister        Ovarian  . Hypertension Sister   . Diabetes Paternal Grandmother   . Heart disease Paternal Uncle   . Diabetes Paternal Uncle   . Heart attack Paternal Grandfather 63       died of MI at 27  . Diabetes  Paternal Aunt   . Diabetes Paternal Uncle   . Colon cancer Neg Hx          Social History:  reports that she has never smoked. She has never used smokeless tobacco. She reports that she does not drink alcohol or use drugs.  Allergies:  Allergies  Allergen Reactions  . Amoxicillin Shortness Of Breath and Rash  . Brilinta [Ticagrelor] Shortness Of Breath  . Erythromycin Shortness Of Breath, Other (See Comments) and Hives    Trouble swallowing  . Flagyl [Metronidazole] Shortness Of Breath and Palpitations  . Penicillins Hives, Shortness Of Breath, Rash and Other (See Comments)    Has patient had a PCN reaction causing immediate rash, facial/tongue/throat swelling, SOB or lightheadedness with hypotension: Yes Has patient had a PCN reaction causing severe rash involving mucus membranes or skin necrosis: No Has patient had a PCN reaction that required hospitalization: Yes Has patient had a PCN reaction occurring within the last 10 years: No If all of the above answers are "NO", then may proceed with Cephalosporin use.   . Isosorbide Mononitrate [Isosorbide Nitrate] Other (See Comments)    Joint aches, muscles hurt, difficult to walk   . Jardiance [Empagliflozin] Other (See Comments)    Nausea, joint aches, muscles aches  . Metformin And Related Other (See Comments)    Stomach pain, cold sweats, joint pain, burred vision, dizziness  . Tape Other (See Comments)  Skin pulls off with certain types Plastic tape causes skin to rip if left on for long periods of time  . Erythromycin Base Rash    Medications:                                                                                                                           Current Facility-Administered Medications  Medication Dose Route Frequency Provider Last Rate Last Dose  .  stroke: mapping our early stages of recovery book   Does not apply Once Karmen Bongo, MD      . 0.9 %  sodium chloride infusion   Intravenous Continuous  Karmen Bongo, MD      . acetaminophen (TYLENOL) tablet 650 mg  650 mg Oral Q4H PRN Karmen Bongo, MD       Or  . acetaminophen (TYLENOL) 160 MG/5ML solution 650 mg  650 mg Per Tube Q4H PRN Karmen Bongo, MD       Or  . acetaminophen (TYLENOL) suppository 650 mg  650 mg Rectal Q4H PRN Karmen Bongo, MD      . albuterol (VENTOLIN HFA) 108 (90 Base) MCG/ACT inhaler 2 puff  2 puff Inhalation Q4H PRN Karmen Bongo, MD      . Derrill Memo ON 12/24/2018] ALPRAZolam Duanne Moron) tablet 0.5 mg  0.5 mg Oral Daily Karmen Bongo, MD      . ALPRAZolam Duanne Moron) tablet 0.5 mg  0.5 mg Oral Daily PRN Karmen Bongo, MD      . ALPRAZolam Duanne Moron) tablet 1 mg  1 mg Oral QHS Karmen Bongo, MD      . apixaban Arne Cleveland) tablet 5 mg  5 mg Oral BID Karmen Bongo, MD      . aspirin suppository 300 mg  300 mg Rectal Daily Karmen Bongo, MD       Or  . aspirin tablet 325 mg  325 mg Oral Daily Karmen Bongo, MD      . bisacodyl (DULCOLAX) EC tablet 5 mg  5 mg Oral Daily PRN Karmen Bongo, MD      . budesonide (PULMICORT) nebulizer solution 0.25 mg  0.25 mg Nebulization BID PRN Karmen Bongo, MD      . Derrill Memo ON 12/24/2018] buPROPion (WELLBUTRIN XL) 24 hr tablet 150 mg  150 mg Oral Daily Karmen Bongo, MD      . clopidogrel (PLAVIX) tablet 75 mg  75 mg Oral Ivery Quale, MD      . Derrill Memo ON 12/24/2018] DULoxetine (CYMBALTA) DR capsule 30 mg  30 mg Oral Daily Karmen Bongo, MD      . gabapentin (NEURONTIN) capsule 300 mg  300 mg Oral QHS Karmen Bongo, MD      . gabapentin (NEURONTIN) capsule 300 mg  300 mg Oral Daily PRN Karmen Bongo, MD      . Derrill Memo ON 12/24/2018] gabapentin (NEURONTIN) capsule 600 mg  600 mg Oral Daily Karmen Bongo, MD      . insulin aspart (novoLOG) injection 0-15 Units  0-15 Units Subcutaneous TID WC Karmen Bongo, MD      . insulin aspart (novoLOG) injection 0-5 Units  0-5 Units Subcutaneous QHS Karmen Bongo, MD      . Insulin Glargine (LANTUS) Solostar Pen  38-44 Units  38-44 Units Subcutaneous See admin instructions Karmen Bongo, MD      . ipratropium-albuterol (DUONEB) 0.5-2.5 (3) MG/3ML nebulizer solution 3 mL  3 mL Nebulization BID PRN Karmen Bongo, MD      . mometasone-formoterol [facility]) 200-5 MCG/ACT inhaler 2 puff  2 puff Inhalation BID Karmen Bongo, MD      . Derrill Memo ON 12/24/2018] [place] Formula CAPS 1 capsule  1 capsule Oral q morning - 10a Karmen Bongo, MD      . Derrill Memo ON 12/24/2018] pantoprazole (PROTONIX) EC tablet 40 mg  40 mg Oral Daily Karmen Bongo, MD      . ranolazine (RANEXA) 12 hr tablet 500 mg  500 mg Oral BID Karmen Bongo, MD      . rosuvastatin (CRESTOR) tablet 40 mg  40 mg Oral Ivery Quale, MD      . senna-docusate (Senokot-S) tablet 1 tablet  1 tablet Oral QHS PRN Karmen Bongo, MD      . zolpidem Lorrin Mais) tablet 5 mg  5 mg Oral QHS Karmen Bongo, MD       Current Outpatient Medications  Medication Sig Dispense Refill  . acetaminophen (TYLENOL) 500 MG tablet Take 1 tablet (500 mg total) by mouth every 6 (six) hours as needed for moderate pain or fever. (Patient taking differently: Take 1,000 mg by mouth every 6 (six) hours as needed for moderate pain, fever or headache. ) 30 tablet 0  . albuterol (VENTOLIN HFA) 108 (90 Base) MCG/ACT inhaler Inhale 2 puffs into the lungs every 4 (four) hours as needed for wheezing or shortness of breath.     . Alirocumab (PRALUENT) 150 MG/ML SOAJ Inject 1 pen into the skin every 14 (fourteen) days. (Patient taking differently: Inject 150 mg into the skin every 14 (fourteen) days. ) 2 pen 11  . ALPRAZolam (XANAX) 0.5 MG tablet Take 1 tablet (0.5 mg total) by mouth 2 (two) times daily as needed for anxiety. (Patient taking differently: Take 0.5-1 mg by mouth See admin instructions. Take one tablet (0.5 mg) by mouth every morning and two tablets (1 mg) at night, may also take 1 tablet (0.5 mg) midday as needed for anxiety) 20 tablet 0  . amLODipine  (NORVASC) 5 MG tablet Take 1 tablet (5 mg total) by mouth daily. (Patient taking differently: Take 5 mg by mouth at bedtime. ) 30 tablet 3  . apixaban (ELIQUIS) 5 MG TABS tablet Take 1 tablet (5 mg total) by mouth 2 (two) times daily. 60 tablet 6  . bisacodyl (DULCOLAX) 5 MG EC tablet Take 1 tablet (5 mg total) by mouth daily as needed for moderate constipation. 30 tablet 1  . budesonide (PULMICORT) 0.25 MG/2ML nebulizer solution Take 2 mLs (0.25 mg total) by nebulization 2 (two) times daily. (Patient taking differently: Take 0.25 mg by nebulization 2 (two) times daily as needed (asthma flares). ) 60 mL 12  . buPROPion (WELLBUTRIN XL) 150 MG 24 hr tablet Take 1 tablet (150 mg total) by mouth daily. 30 tablet 0  . clopidogrel (PLAVIX) 75 MG tablet Take 1 tablet (75 mg total) by mouth at bedtime. 90 tablet 1  . denosumab (PROLIA) 60 MG/ML SOSY injection Inject 60 mg into the skin every 6 (six) months.     Marland Kitchen  dextromethorphan-guaiFENesin (MUCINEX DM) 30-600 MG 12hr tablet Take 1 tablet by mouth 2 (two) times daily. (Patient taking differently: Take 1 tablet by mouth every morning. ) 60 tablet 0  . DULoxetine (CYMBALTA) 30 MG capsule Take 1 capsule (30 mg total) by mouth daily. 30 capsule 0  . ezetimibe (ZETIA) 10 MG tablet Take 1 tablet (10 mg total) by mouth at bedtime.    . Fluticasone-Salmeterol (ADVAIR) 250-50 MCG/DOSE AEPB Inhale 1 puff into the lungs daily.    Marland Kitchen gabapentin (NEURONTIN) 300 MG capsule Take 1 capsule (300 mg total) by mouth daily. (Patient taking differently: Take 300-600 mg by mouth See admin instructions. Take two capsules (600 mg) by mouth every morning and one capsule (300 mg) at night; may also take one capsule (300 mg) midday as needed for nerve pain) 30 capsule 1  . HYDROcodone-acetaminophen (NORCO/VICODIN) 5-325 MG tablet Take 1 tablet by mouth at bedtime as needed for moderate pain.    . Insulin Glargine (LANTUS SOLOSTAR) 100 UNIT/ML Solostar Pen Inject 38-44 Units into the skin  See admin instructions. Inject 38 units subcutaneously daily at bedtime, increase to 44 units for CBG >200    . insulin lispro (HUMALOG KWIKPEN) 100 UNIT/ML KwikPen Inject 14-18 Units into the skin See admin instructions. Inject 14 units subcutaneously prior to breakfast and supper; add 4 units for CBG >200    . ipratropium-albuterol (DUONEB) 0.5-2.5 (3) MG/3ML SOLN Take 3 mLs by nebulization 2 (two) times daily. (Patient taking differently: Take 3 mLs by nebulization 2 (two) times daily as needed (shortness of breath). ) 360 mL   . metolazone (ZAROXOLYN) 2.5 MG tablet Take 1 tablet (2.5 mg total) by mouth daily. As directed by heart failure clinic. Call heart failure office for direction (Patient taking differently: Take 2.5 mg by mouth daily as needed (As directed by heart failure clinic. Call heart failure office for direction when experiencing overnight weight gain). ) 5 tablet 0  . metoprolol succinate (TOPROL-XL) 50 MG 24 hr tablet Take 1 tablet (50 mg total) by mouth daily. Take with or immediately following a meal. 30 tablet 1  . Multiple Vitamin (MULTIVITAMIN WITH MINERALS) TABS tablet Take 1 tablet by mouth every morning. Centrum - Women over 13    . Multiple Vitamins-Minerals (OCUVITE EYE HEALTH FORMULA) CAPS Take 1 capsule by mouth every morning.    . nitroGLYCERIN (NITROSTAT) 0.3 MG SL tablet Place 1 tablet (0.3 mg total) under the tongue every 5 (five) minutes x 3 doses as needed for chest pain. 9 tablet 1  . ondansetron (ZOFRAN-ODT) 4 MG disintegrating tablet Take 4 mg by mouth 2 (two) times daily as needed for nausea or vomiting.    . OXYGEN Inhale 2 L into the lungs at bedtime as needed (shortness of breath).     . pantoprazole (PROTONIX) 40 MG tablet Take 1 tablet (40 mg total) by mouth daily. 30 tablet 0  . Polyvinyl Alcohol-Povidone (REFRESH OP) Place 1 drop into both eyes daily as needed (redness/ dry eyes).    . potassium chloride SA (K-DUR) 20 MEQ tablet TAKE ONE (1) TABLET BY  MOUTH TWO (2) TIMES DAILY (Patient taking differently: Take 10 mEq by mouth 2 (two) times daily. ) 60 tablet 1  . Pyridoxine HCl (VITAMIN B-6 PO) Take 1 tablet by mouth every morning.    . ranolazine (RANEXA) 500 MG 12 hr tablet Take 1 tablet (500 mg total) by mouth 2 (two) times daily. 60 tablet 1  . rosuvastatin (CRESTOR) 40 MG  tablet Take 1 tablet (40 mg total) by mouth at bedtime. 30 tablet 0  . spironolactone (ALDACTONE) 25 MG tablet Take 1 tablet (25 mg total) by mouth daily. 30 tablet 3  . torsemide (DEMADEX) 20 MG tablet Take 40-80 mg by mouth See admin instructions. Take two tablets (40 mg) by mouth every morning, take four tablets (80 mg) for increased swelling of legs and arms - max 4 tablets daily    . triamcinolone cream (KENALOG) 0.1 % Apply 1 application topically daily as needed (crusty spots on arms).    . vitamin B-12 (CYANOCOBALAMIN) 1000 MCG tablet Take 1,000 mcg by mouth every morning.     . zolpidem (AMBIEN) 10 MG tablet Take 10 mg by mouth at bedtime.    . torsemide (DEMADEX) 20 MG tablet Take 2 tablets (40 mg total) by mouth 2 (two) times daily for 2 days, THEN 2 tablets (40 mg total) daily. (Patient not taking: Reported on 12/23/2018) 64 tablet 4     ROS:                                                                                                                                       ROS was performed and is negative except as noted in HPI   General Examination:                                                                                                      Blood pressure (!) 149/66, pulse (!) 56, temperature (!) 97.5 F (36.4 C), temperature source Oral, resp. rate 17, SpO2 97 %.  Physical Exam  Constitutional: Appears well-developed and well-nourished.  Psych: Affect appropriate to situation Eyes: Normal external eye and conjunctiva. HENT: Normocephalic, no lesions, without obvious abnormality.   Musculoskeletal-no joint tenderness, deformity , pitting  edema BLE Cardiovascular: Normal rate and regular rhythm.  Respiratory: Effort normal, non-labored breathing saturations WNL GI: Soft.  No distension. There is no tenderness.  Skin: WDI  Neurological Examination Mental Status: Alert, oriented, thought content appropriate. Naming intact. Speech fluent without evidence of aphasia.  Able to follow 3 step commands without difficulty. Cranial Nerves: II:  Visual fields grossly normal, PERRL III,IV, VI: ptosis not present, EOMI V,VII: smile symmetric, facial light touch sensation decreased on right side VIII: hearing intact to conversation IX,X: No hypophonia XI: Symmetric shoulder shrug XII: midline tongue extension Motor: Right : Upper extremity   5/5  Left:     Upper extremity   5/5  Lower  extremity   5/5   Lower extremity   5/5 Tone and bulk: increased tone on right leg, none in the left leg Sensory: cool temp and light touch decreased in a stocking distribution. Deep Tendon Reflexes: 2+ right biceps 1 + left bicep and 2+ right  Patella 1+ achilles, 1+ left patella 0 achilles Plantars: Right: downgoing  Left: downgoing Cerebellar: normal finger-to-nose bilaterally.  Gait: Deferred   Lab Results: Basic Metabolic Panel: Recent Labs  Lab 12/23/18 1339  NA 138  K 3.8  CL 98  CO2 31  GLUCOSE 207*  BUN 19  CREATININE 1.47*  CALCIUM 9.3    CBC: Recent Labs  Lab 12/23/18 1339  WBC 9.2  NEUTROABS 6.8  HGB 10.1*  HCT 34.7*  MCV 83.8  PLT 203     CBG: Recent Labs  Lab 12/23/18 1635  GLUCAP 120*    Imaging: Mr Angio Head Wo Contrast  Result Date: 12/23/2018 CLINICAL DATA:  TIA EXAM: MRI HEAD WITHOUT CONTRAST MRA HEAD WITHOUT CONTRAST TECHNIQUE: Multiplanar, multiecho pulse sequences of the brain and surrounding structures were obtained without intravenous contrast. Angiographic images of the head were obtained using MRA technique without contrast. COMPARISON:  CT head 09/18/2018 FINDINGS: MRI HEAD FINDINGS Brain:  Negative for acute infarct Chronic right posterior cerebral artery infarct involving the inferior right occipital lobe. Chronic ischemic change in the white matter. Scattered areas of chronic microhemorrhage in the left cerebral hemisphere. Vascular: Normal arterial flow voids. Skull and upper cervical spine: No focal bone lesion. Anterolisthesis C3-4 with spurring and spinal stenosis. Cord flattening. Sinuses/Orbits: Negative Other: None MRA HEAD FINDINGS Both vertebral arteries patent to the basilar. PICA patent bilaterally with scattered atherosclerotic disease. Mild to moderate stenosis in the proximal and mid basilar. Superior cerebellar and posterior cerebral arteries patent bilaterally. Multiple areas of moderate stenosis in the distal posterior cerebral artery on the right. Extensive atherosclerotic irregularity and moderate stenosis throughout the cavernous carotid bilaterally. Moderate stenosis in the A2 on the right. Mild stenosis left A2. Middle cerebral arteries patent without significant stenosis or occlusion. Negative for cerebral aneurysm. IMPRESSION: Negative for acute infarct Chronic right PCA infarct.  Chronic ischemia in the white matter Extensive intracranial atherosclerotic disease. Electronically Signed   By: Franchot Gallo M.D.   On: 12/23/2018 17:46   Mr Brain Wo Contrast  Result Date: 12/23/2018 CLINICAL DATA:  TIA EXAM: MRI HEAD WITHOUT CONTRAST MRA HEAD WITHOUT CONTRAST TECHNIQUE: Multiplanar, multiecho pulse sequences of the brain and surrounding structures were obtained without intravenous contrast. Angiographic images of the head were obtained using MRA technique without contrast. COMPARISON:  CT head 09/18/2018 FINDINGS: MRI HEAD FINDINGS Brain: Negative for acute infarct Chronic right posterior cerebral artery infarct involving the inferior right occipital lobe. Chronic ischemic change in the white matter. Scattered areas of chronic microhemorrhage in the left cerebral  hemisphere. Vascular: Normal arterial flow voids. Skull and upper cervical spine: No focal bone lesion. Anterolisthesis C3-4 with spurring and spinal stenosis. Cord flattening. Sinuses/Orbits: Negative Other: None MRA HEAD FINDINGS Both vertebral arteries patent to the basilar. PICA patent bilaterally with scattered atherosclerotic disease. Mild to moderate stenosis in the proximal and mid basilar. Superior cerebellar and posterior cerebral arteries patent bilaterally. Multiple areas of moderate stenosis in the distal posterior cerebral artery on the right. Extensive atherosclerotic irregularity and moderate stenosis throughout the cavernous carotid bilaterally. Moderate stenosis in the A2 on the right. Mild stenosis left A2. Middle cerebral arteries patent without significant stenosis or occlusion. Negative for cerebral aneurysm.  IMPRESSION: Negative for acute infarct Chronic right PCA infarct.  Chronic ischemia in the white matter Extensive intracranial atherosclerotic disease. Electronically Signed   By: Franchot Gallo M.D.   On: 12/23/2018 17:46    [name] Morale, MSN, NP-C [facility] [phone] 12/23/2018, 5:48 PM    Assessment:  73 y.o. female with a PMHx significant for atrial fibrillation, DM2, PVD, CVA (2014, 2015, 2016), migraine, HTN, HLD, BCC (2016), CHF and CAD who presented to the [facility] ED with c/o unsteady gait and slurred speech. Family and patient then described more complex events labeled by them as TIA's, stating that she had approximately 9 such TIAs in the past 10 days. The events have stereotyped features, but vary significantly in duration and are associated with complex behaviors. Neurology was consulted to assess for seizure versus TIA 1. MRI did not show anything acute, but noted a chronic right PCA infarct.  2. MRA: extensive atherosclerotic disease. 3. Exam is nonlateralizing. Peripheral neuropathic findings are noted.  4. Stroke risk factors: Prior history of  strokes with confirmed chronic infarction on MRI, atrial fibrillation, DM2, PVD, HTN, HLD, CHF and CAD 5. Overall impression is that the spells are less likely to seizures given varying duration, maintenance of an awake albeit less alert state during the spells. However, they are somewhat stereotyped and she does have a focus that could serve as a seizure onset zone (old stroke on MRI). The spells would be quite atypical for TIA. Also on the DDx is cognitive fluctuation in the setting of a possible incipient Lewy body dementia (has visual hallucinations and acts out her dreams). Psychogenic spells are also a consideration.  Recommendations: -EEG -Stroke work up to be completed with TTE, carotid ultrasound and cardiac telemetry -Stroke prophylaxis with Eliquis in the setting of her a-fib.  -Also on Plavix. She has a history of stents.  --Please page stroke NP  Or  PA  Or MD from 8am -4 pm  as this patient from this time will be  followed by the stroke.   You can look them up on [email or web address]  Password [email or web address]   I have seen and examined the patient. I have formulated the assessment and recommendations. 73 year old female with atypical spells. DDx includes partial complex seizures, episodes of NCSE, cognitive fluctuations due to dementia, and psychogenic spells. TIAs are felt to be unlikely. Recommendations as above.  Electronically signed: Dr. Kerney Elbe

## 2018-12-23 NOTE — ED Notes (Signed)
Called to give report - Nurse not available

## 2018-12-23 NOTE — ED Triage Notes (Signed)
Reports she is having TIAs 7 out of last 10 days.  States she doesn't remember what happens but her daughter states she is having unsteady gait and slurred speech.  Denies any symptoms at present except she feels like her L eyelid is drooping.

## 2018-12-24 ENCOUNTER — Other Ambulatory Visit (HOSPITAL_COMMUNITY): Payer: Medicare Other

## 2018-12-24 ENCOUNTER — Observation Stay (HOSPITAL_COMMUNITY): Payer: Medicare Other

## 2018-12-24 ENCOUNTER — Observation Stay (HOSPITAL_BASED_OUTPATIENT_CLINIC_OR_DEPARTMENT_OTHER): Payer: Medicare Other

## 2018-12-24 DIAGNOSIS — R404 Transient alteration of awareness: Secondary | ICD-10-CM | POA: Diagnosis not present

## 2018-12-24 DIAGNOSIS — I361 Nonrheumatic tricuspid (valve) insufficiency: Secondary | ICD-10-CM

## 2018-12-24 DIAGNOSIS — I10 Essential (primary) hypertension: Secondary | ICD-10-CM | POA: Diagnosis not present

## 2018-12-24 DIAGNOSIS — I340 Nonrheumatic mitral (valve) insufficiency: Secondary | ICD-10-CM

## 2018-12-24 DIAGNOSIS — G459 Transient cerebral ischemic attack, unspecified: Secondary | ICD-10-CM | POA: Diagnosis not present

## 2018-12-24 LAB — LIPID PANEL
Cholesterol: 90 mg/dL (ref 0–200)
HDL: 34 mg/dL — ABNORMAL LOW (ref >40)
LDL Cholesterol: 38 mg/dL (ref 0–99)
Total CHOL/HDL Ratio: 2.6 RATIO
Triglycerides: 90 mg/dL (ref <150)
VLDL: 18 mg/dL (ref 0–40)

## 2018-12-24 LAB — GLUCOSE, CAPILLARY
Glucose-Capillary: 135 mg/dL — ABNORMAL HIGH (ref 70–99)
Glucose-Capillary: 157 mg/dL — ABNORMAL HIGH (ref 70–99)
Glucose-Capillary: 182 mg/dL — ABNORMAL HIGH (ref 70–99)
Glucose-Capillary: 198 mg/dL — ABNORMAL HIGH (ref 70–99)

## 2018-12-24 LAB — ECHOCARDIOGRAM COMPLETE
Height: 62 in
Weight: 3657.87 oz

## 2018-12-24 LAB — SARS CORONAVIRUS 2 (TAT 6-24 HRS): SARS Coronavirus 2: NEGATIVE

## 2018-12-24 NOTE — Evaluation (Signed)
Occupational Therapy Evaluation Patient Details Name: [name] [name] [name]: [id number] DOB: 18-Jan-1946 Today's Date: 12/24/2018    History of Present Illness [name] [name] is a 73 y.o. female with a PMHx significant for DM2, PVD, CVA (2014, 2015, 2016), migraine, HTN, HLD, BCC (2016), CHF, L ankle ORIF (05/2018) and CAD who presented to the [facility] ED with c/o unsteady gait and slurred speech. Family and patient then described more complex events labeled by them as TIA's, stating that she had approximately 9 such TIAs in the past 10 days. The events have stereotyped features, but vary significantly in duration and are associated with complex behaviors. MRI negative for acute infarct; showing chronic right PCA infarct.   Clinical Impression   Pt PTA: Pt lives with spouse and daughter available to assist for ADL/IADL needs. Pt currently ambulating in room and transfers with supervisionA for mobility with RW. Pt performing ADL tasks with supervisionA; pt able to problem solve with wires and lines to donn underwear. Pt reports some back pain if standing too long. Pt standing at sink for grooming with no physical assist required. Pt reports that she is back to baseline for ADL. Pt with no focal deficits noted. RUE slightly weaker than LUE, but no decreased function. No further OT needs. OT signing off.     Follow Up Recommendations  No OT follow up;Supervision - Intermittent    Equipment Recommendations  None recommended by OT    Recommendations for Other Services       Precautions / Restrictions Precautions Precautions: Fall Restrictions Weight Bearing Restrictions: No      Mobility Bed Mobility               General bed mobility comments: up with PT upon arrival  Transfers Overall transfer level: Needs assistance Equipment used: Rolling walker (2 wheeled) Transfers: Sit to/from Stand Sit to Stand: Min guard              Balance Overall balance assessment: Needs  assistance Sitting-balance support: Feet supported Sitting balance-Leahy Scale: Good     Standing balance support: No upper extremity supported;During functional activity Standing balance-Leahy Scale: Fair Standing balance comment: Supervision for safety when donning underwear                           ADL either performed or assessed with clinical judgement   ADL Overall ADL's : At baseline                                     Functional mobility during ADLs: Supervision/safety;Rolling walker;Cueing for safety General ADL Comments: Pt takes increased time, but reports this is normal and my daughter is used to assisting me as needed. Pt performing own pericare and stood at sink for ADL     Vision Baseline Vision/History: Wears glasses Wears Glasses: At all times Vision Assessment?: No apparent visual deficits     Perception     Praxis      Pertinent Vitals/Pain Faces Pain Scale: Hurts little more Pain Location: R flank from fall out of bed Pain Descriptors / Indicators: Grimacing;Guarding Pain Intervention(s): Monitored during session     Hand Dominance Right   Extremity/Trunk Assessment Upper Extremity Assessment Upper Extremity Assessment: Overall WFL for tasks assessed   Lower Extremity Assessment RLE Deficits / Details: Strength 5/5 LLE Deficits / Details: Strength5/5   Cervical / Trunk  Assessment Cervical / Trunk Assessment: Other exceptions Cervical / Trunk Exceptions: large body habitus   Communication Communication Communication: No difficulties   Cognition Arousal/Alertness: Awake/alert Behavior During Therapy: WFL for tasks assessed/performed Overall Cognitive Status: Within Functional Limits for tasks assessed                                 General Comments: following all commands   General Comments  VSS >90% O2     Exercises     Shoulder Instructions      Home Living Family/patient expects to be  discharged to:: Private residence Living Arrangements: Spouse/significant other Available Help at Discharge: Family;Available 24 hours/day(daughter, husband) Type of Home: House Home Access: Stairs to enter [employer] of Steps: 1 Entrance Stairs-Rails: None Home Layout: Bed/bath upstairs;1/2 bath on main level     Bathroom Shower/Tub: Occupational psychologist: Handicapped height Bathroom Accessibility: Yes   Home Equipment: Environmental consultant - 2 wheels;Shower seat;Other (comment);Grab bars - toilet;Grab bars - tub/shower(stair lift)          Prior Functioning/Environment Level of Independence: Needs assistance  Gait / Transfers Assistance Needed: amb with rolling walker ADL's / Homemaking Assistance Needed: supervision for shower transfers, dressing indep, husband assists with IADL's            OT Problem List: Decreased activity tolerance      OT Treatment/Interventions:      OT Goals(Current goals can be found in the care plan section) Acute Rehab OT Goals Patient Stated Goal: "walk longer distances."  OT Frequency:     Barriers to D/C:            Co-evaluation PT/OT/SLP Co-Evaluation/Treatment: Yes Reason for Co-Treatment: To address functional/ADL transfers   OT goals addressed during session: ADL's and self-care      AM-PAC OT "6 Clicks" Daily Activity     Outcome Measure Help from another person eating meals?: None Help from another person taking care of personal grooming?: None Help from another person toileting, which includes using toliet, bedpan, or urinal?: None Help from another person bathing (including washing, rinsing, drying)?: A Little Help from another person to put on and taking off regular upper body clothing?: None Help from another person to put on and taking off regular lower body clothing?: None 6 Click Score: 23   End of Session Equipment Utilized During Treatment: Gait belt;Rolling walker Nurse Communication: Mobility  status  Activity Tolerance: Patient tolerated treatment well Patient left: in chair;with call bell/phone within reach;with chair alarm set  OT Visit Diagnosis: Unsteadiness on feet (R26.81)                Time: 6195-0932 OT Time Calculation (min): 25 min Charges:  OT General Charges $OT Visit: 1 Visit OT Evaluation $OT Eval Moderate Complexity: 1 Mod  Darryl Nestle) Marsa Aris OTR/L Acute Rehabilitation Services Pager: [phone] Office: [place] 12/24/2018, 4:21 PM

## 2018-12-24 NOTE — Progress Notes (Signed)
  Echocardiogram 2D Echocardiogram has been performed.  [name] [name] 12/24/2018, 10:21 AM

## 2018-12-24 NOTE — Evaluation (Signed)
Physical Therapy Evaluation Patient Details Name: [name] [name] [name]: [id number] DOB: 1945/08/11 Today's Date: 12/24/2018   History of Present Illness  [name] is a 73 y.o. female with a PMHx significant for DM2, PVD, CVA (2014, 2015, 2016), migraine, HTN, HLD, BCC (2016), CHF, L ankle ORIF (05/2018) and CAD who presented to the [facility] ED with c/o unsteady gait and slurred speech. Family and patient then described more complex events labeled by them as TIA's, stating that she had approximately 9 such TIAs in the past 10 days. The events have stereotyped features, but vary significantly in duration and are associated with complex behaviors. MRI negative for acute infarct; showing chronic right PCA infarct.  Clinical Impression  Pt admitted with above. Currently, pt appears to be close to functional baseline. Ambulating 150 feet with walker at a supervision level. SpO2 93% on RA. Presents with decreased endurance and activity tolerance. No left sided weakness noted on assessment. Will benefit from HHPT to address deficits and maximize functional independence.     Follow Up Recommendations Home health PT    Equipment Recommendations  None recommended by PT    Recommendations for Other Services       Precautions / Restrictions Precautions Precautions: Fall Restrictions Weight Bearing Restrictions: No      Mobility  Bed Mobility Overal bed mobility: Modified Independent             General bed mobility comments: HOB elevated  Transfers Overall transfer level: Needs assistance Equipment used: Rolling walker (2 wheeled) Transfers: Sit to/from Stand Sit to Stand: Min guard            Ambulation/Gait Ambulation/Gait assistance: Supervision Gait Distance (Feet): 150 Feet Assistive device: Rolling walker (2 wheeled) Gait Pattern/deviations: Step-through pattern;Decreased stride length;Decreased step length - left Gait velocity: decreased   General Gait Details: Slow  and steady pace with bilateral foot external rotation (likely baseline); slightly flexed trunk posture  Stairs            Wheelchair Mobility    Modified Rankin (Stroke Patients Only) Modified Rankin (Stroke Patients Only) Pre-Morbid Rankin Score: Moderate disability Modified Rankin: Moderately severe disability     Balance Overall balance assessment: Needs assistance Sitting-balance support: Feet supported Sitting balance-Leahy Scale: Good     Standing balance support: No upper extremity supported;During functional activity Standing balance-Leahy Scale: Fair Standing balance comment: Supervision for safety when donning underwear                             Pertinent Vitals/Pain Pain Assessment: Faces Faces Pain Scale: Hurts little more Pain Location: R flank from fall out of bed Pain Descriptors / Indicators: Grimacing;Guarding Pain Intervention(s): Monitored during session    Home Living Family/patient expects to be discharged to:: Private residence Living Arrangements: Spouse/significant other Available Help at Discharge: Family;Available 24 hours/day(daughter, husband) Type of Home: House Home Access: Stairs to enter Entrance Stairs-Rails: None Entrance Stairs-Number of Steps: 1 Home Layout: Bed/bath upstairs;1/2 bath on main level Home Equipment: Walker - 2 wheels;Shower seat;Other (comment);Grab bars - toilet;Grab bars - tub/shower(stair lift)      Prior Function Level of Independence: Needs assistance   Gait / Transfers Assistance Needed: amb with rolling walker  ADL's / Homemaking Assistance Needed: supervision for shower transfers, dressing indep, husband assists with IADL's        Hand Dominance   Dominant Hand: Right    Extremity/Trunk Assessment   Upper Extremity Assessment  Upper Extremity Assessment: Defer to OT evaluation    Lower Extremity Assessment Lower Extremity Assessment: RLE deficits/detail;LLE deficits/detail RLE  Deficits / Details: Strength 5/5 LLE Deficits / Details: Strength5/5       Communication   Communication: No difficulties  Cognition Arousal/Alertness: Awake/alert Behavior During Therapy: WFL for tasks assessed/performed Overall Cognitive Status: Within Functional Limits for tasks assessed                                 General Comments: Very pleasant, eager to participate in therapy      General Comments      Exercises     Assessment/Plan    PT Assessment Patient needs continued PT services  PT Problem List Decreased strength;Decreased activity tolerance;Decreased balance;Decreased mobility       PT Treatment Interventions DME instruction;Gait training;Stair training;Therapeutic exercise;Functional mobility training;Therapeutic activities;Balance training;Patient/family education    PT Goals (Current goals can be found in the Care Plan section)  Acute Rehab PT Goals Patient Stated Goal: "walk longer distances." PT Goal Formulation: With patient Time For Goal Achievement: 01/07/19 Potential to Achieve Goals: Good    Frequency Min 3X/week   Barriers to discharge        Co-evaluation PT/OT/SLP Co-Evaluation/Treatment: Yes Reason for Co-Treatment: To address functional/ADL transfers PT goals addressed during session: Mobility/safety with mobility         AM-PAC PT "6 Clicks" Mobility  Outcome Measure Help needed turning from your back to your side while in a flat bed without using bedrails?: None Help needed moving from lying on your back to sitting on the side of a flat bed without using bedrails?: None Help needed moving to and from a bed to a chair (including a wheelchair)?: None Help needed standing up from a chair using your arms (e.g., wheelchair or bedside chair)?: A Little Help needed to walk in hospital room?: None Help needed climbing 3-5 steps with a railing? : A Lot 6 Click Score: 21    End of Session Equipment Utilized During  Treatment: Gait belt Activity Tolerance: Patient tolerated treatment well Patient left: in chair;with call bell/phone within reach;with chair alarm set Nurse Communication: Mobility status PT Visit Diagnosis: Unsteadiness on feet (R26.81);History of falling (Z91.81);Difficulty in walking, not elsewhere classified (R26.2)    Time: 2202-5427 PT Time Calculation (min) (ACUTE ONLY): 30 min   Charges:   PT Evaluation $PT Eval Moderate Complexity: 1 Mod          Ellamae Sia, [place], DPT Acute Rehabilitation Services Pager [phone] Office [phone]   Willy Eddy 12/24/2018, 9:16 AM

## 2018-12-24 NOTE — Plan of Care (Signed)
Patient ambulated to rest room and back to chair in the room without showing signs of increased activity intolerance.

## 2018-12-24 NOTE — Progress Notes (Signed)
EEG complete - results pending 

## 2018-12-24 NOTE — Progress Notes (Addendum)
Progress Note    [name] [name]  EGB:[id number] DOB: [birth year]  DOA: 12/23/2018 PCP: Derinda Late, MD    Brief Narrative:     Medical records reviewed and are as summarized below:  [name] [name] is an 73 y.o. female with medical history significant of CVA; DM; PVD; OSA not on CPAP; obesity; dementia; HTN; HLD;  Chronic low back pain; CKD; home O2; and chronic combined CHF presenting with "TIAs".  The patient's daughter provides most of the history.  She reports that the patient normally has about 10 "ghost TIAs" per day, where she has a dead stare and zones out.  She also has intermittent TIAs about once a month, but these have been occurring faster and stronger than usual.  She has had about 9 episodes in the last 10 days.  She has had excessive slurred speech, difficulty finishing her sentences, audio and visual hallucinations, n/v, gasping respirations, difficulty with memory, L face and eye drooping, LE tingling, ataxia, and weakness with episodes.  Her left "carotid was distended" last Thursday.  She also had trouble seeing one day.     Assessment/Plan:   Principal Problem:   TIA (transient ischemic attack) Active Problems:   Diabetes mellitus type 2, controlled, with complications ([place])   Hyperlipemia   Essential hypertension   Atrial fibrillation (HCC)   Chronic combined systolic (congestive) and diastolic (congestive) heart failure (HCC)   CKD (chronic kidney disease), stage III   Neurologic event: TIA vs seizures -Patient with known severe intracranial stenosis presenting with symptoms atypical for TIA -She has known carotid stenosis with h/o L stent placement in 2015 -She had recent carotid US on 10/12 with mild R ICA stenosis and no restenosis on the L; it seems unlikely that stenosis would have developed in such a short period of time, but neurology has recommended repeat carotid US -Telemetry monitoring -MRI/MRA: no acute infarct -Echo pending -Continue  Plavix, Eliquis -Neurology consult appreciated -EEG pending -PT home health  HTN -Allow permissive HTNfor now -Treat BP only if >220/120, and then with goal of 15% reduction -Hold Norvasc and Toproland plan to restart in 48-72 hours  HLD -Check FLP -Continue Crestor 40 mg daily -Hold Zetia due to limited inpatient utility -She is also on Praluent as an outpatient  DM -Recent A1c shows reasonably good control -Continue Lantus -Will order moderate-scale SSI  parox Afib on Eliquis -Holding Toprol, which is her rate controlling agent, so will need to be monitored -Continue Eliquis  Chronic combined CHF -Appears to be compensated at this time -Hold Demadex and Aldactone for now  Stage 3b CKD -Appears to be stable at this time -Repeat BMP in AM  CAD -No c/o angina pain at this time -Continue Ranexa  COPD -Continue home meds including prn O2, Duoneb, Advair, Pulmicort  obesity Body mass index is 41.81 kg/m.   Family Communication/Anticipated D/C date and plan/Code Status   DVT prophylaxis: eliquis Code Status: Full Code.  Family Communication:  Disposition Plan: pending work up   Medical Consultants:    Neuro  Subjective:   Has been hospitalized several times this year for PNA and other issues  Objective:    Vitals:   12/24/18 0427 12/24/18 0810 12/24/18 0819 12/24/18 1300  BP: (!) 131/49 (!) 124/40  (!) 146/50  Pulse: (!) 51 (!) 54  62  Resp:  16  20  Temp: 98 F (36.7 C) 98.6 F (37 C)  97.9 F (36.6 C)  TempSrc: Oral Axillary  Oral  SpO2: 100% 97% 98% 96%  Weight:      Height:        Intake/Output Summary (Last 24 hours) at 12/24/2018 1347 Last data filed at 12/24/2018 0300 Gross per 24 hour  Intake 793.94 ml  Output -  Net 793.94 ml   Filed Weights   12/23/18 2025  Weight: 103.7 kg    Exam: In chair, pleasant and cooperative rrr (h/o a fib) No increased work of breathing A+OX3  Data Reviewed:   I have  personally reviewed following labs and imaging studies:  Labs: Labs show the following:   Basic Metabolic Panel: Recent Labs  Lab 12/23/18 1339  NA 138  K 3.8  CL 98  CO2 31  GLUCOSE 207*  BUN 19  CREATININE 1.47*  CALCIUM 9.3   GFR Estimated Creatinine Clearance: 39.6 mL/min (A) (by C-G formula based on SCr of 1.47 mg/dL (H)). Liver Function Tests: Recent Labs  Lab 12/23/18 1339  AST 23  ALT 17  ALKPHOS 84  BILITOT 0.5  PROT 6.7  ALBUMIN 3.5   No results for input(s): LIPASE, AMYLASE in the last 168 hours. No results for input(s): AMMONIA in the last 168 hours. Coagulation profile Recent Labs  Lab 12/23/18 1339  INR 1.6*    CBC: Recent Labs  Lab 12/23/18 1339  WBC 9.2  NEUTROABS 6.8  HGB 10.1*  HCT 34.7*  MCV 83.8  PLT 203   Cardiac Enzymes: No results for input(s): CKTOTAL, CKMB, CKMBINDEX, TROPONINI in the last 168 hours. BNP (last 3 results) No results for input(s): PROBNP in the last 8760 hours. CBG: Recent Labs  Lab 12/23/18 1635 12/23/18 1823 12/23/18 2117 12/24/18 0641 12/24/18 1149  GLUCAP 120* 111* 153* 135* 157*   D-Dimer: No results for input(s): DDIMER in the last 72 hours. Hgb A1c: No results for input(s): HGBA1C in the last 72 hours. Lipid Profile: Recent Labs    12/24/18 0336  CHOL 90  HDL 34*  LDLCALC 38  TRIG 90  CHOLHDL 2.6   Thyroid function studies: No results for input(s): TSH, T4TOTAL, T3FREE, THYROIDAB in the last 72 hours.  Invalid input(s): FREET3 Anemia work up: No results for input(s): VITAMINB12, FOLATE, FERRITIN, TIBC, IRON, RETICCTPCT in the last 72 hours. Sepsis Labs: Recent Labs  Lab 12/23/18 1339  WBC 9.2    Microbiology Recent Results (from the past 240 hour(s))  SARS CORONAVIRUS 2 (TAT 6-24 HRS) Nasopharyngeal Nasopharyngeal Swab     Status: None   Collection Time: 12/23/18  9:21 PM   Specimen: Nasopharyngeal Swab  Result Value Ref Range Status   SARS Coronavirus 2 NEGATIVE NEGATIVE  Final    Comment: (NOTE) SARS-CoV-2 target nucleic acids are NOT DETECTED. The SARS-CoV-2 RNA is generally detectable in upper and lower respiratory specimens during the acute phase of infection. Negative results do not preclude SARS-CoV-2 infection, do not rule out co-infections with other pathogens, and should not be used as the sole basis for treatment or other patient management decisions. Negative results must be combined with clinical observations, patient history, and epidemiological information. The expected result is Negative. Fact Sheet for Patients: SugarRoll.be Fact Sheet for Healthcare Providers: https://www.woods-mathews.com/ This test is not yet approved or cleared by the [place] FDA and  has been authorized for detection and/or diagnosis of SARS-CoV-2 by FDA under an Emergency Use Authorization (EUA). This EUA will remain  in effect (meaning this test can be used) for the duration of the COVID-19 declaration under Section 56 4(b)(1)  of the Act, 21 U.S.C. section 360bbb-3(b)(1), unless the authorization is terminated or revoked sooner. Performed at [facility], [place] [address], [place], [place] [address]     Procedures and diagnostic studies:  Mr Angio Head Wo Contrast  Result Date: 12/23/2018 CLINICAL DATA:  TIA EXAM: MRI HEAD WITHOUT CONTRAST MRA HEAD WITHOUT CONTRAST TECHNIQUE: Multiplanar, multiecho pulse sequences of the brain and surrounding structures were obtained without intravenous contrast. Angiographic images of the head were obtained using MRA technique without contrast. COMPARISON:  CT head 09/18/2018 FINDINGS: MRI HEAD FINDINGS Brain: Negative for acute infarct Chronic right posterior cerebral artery infarct involving the inferior right occipital lobe. Chronic ischemic change in the white matter. Scattered areas of chronic microhemorrhage in the left cerebral hemisphere. Vascular: Normal arterial flow  voids. Skull and upper cervical spine: No focal bone lesion. Anterolisthesis C3-4 with spurring and spinal stenosis. Cord flattening. Sinuses/Orbits: Negative Other: None MRA HEAD FINDINGS Both vertebral arteries patent to the basilar. PICA patent bilaterally with scattered atherosclerotic disease. Mild to moderate stenosis in the proximal and mid basilar. Superior cerebellar and posterior cerebral arteries patent bilaterally. Multiple areas of moderate stenosis in the distal posterior cerebral artery on the right. Extensive atherosclerotic irregularity and moderate stenosis throughout the cavernous carotid bilaterally. Moderate stenosis in the A2 on the right. Mild stenosis left A2. Middle cerebral arteries patent without significant stenosis or occlusion. Negative for cerebral aneurysm. IMPRESSION: Negative for acute infarct Chronic right PCA infarct.  Chronic ischemia in the white matter Extensive intracranial atherosclerotic disease. Electronically Signed   By: Franchot Gallo M.D.   On: 12/23/2018 17:46   Mr Brain Wo Contrast  Result Date: 12/23/2018 CLINICAL DATA:  TIA EXAM: MRI HEAD WITHOUT CONTRAST MRA HEAD WITHOUT CONTRAST TECHNIQUE: Multiplanar, multiecho pulse sequences of the brain and surrounding structures were obtained without intravenous contrast. Angiographic images of the head were obtained using MRA technique without contrast. COMPARISON:  CT head 09/18/2018 FINDINGS: MRI HEAD FINDINGS Brain: Negative for acute infarct Chronic right posterior cerebral artery infarct involving the inferior right occipital lobe. Chronic ischemic change in the white matter. Scattered areas of chronic microhemorrhage in the left cerebral hemisphere. Vascular: Normal arterial flow voids. Skull and upper cervical spine: No focal bone lesion. Anterolisthesis C3-4 with spurring and spinal stenosis. Cord flattening. Sinuses/Orbits: Negative Other: None MRA HEAD FINDINGS Both vertebral arteries patent to the basilar.  PICA patent bilaterally with scattered atherosclerotic disease. Mild to moderate stenosis in the proximal and mid basilar. Superior cerebellar and posterior cerebral arteries patent bilaterally. Multiple areas of moderate stenosis in the distal posterior cerebral artery on the right. Extensive atherosclerotic irregularity and moderate stenosis throughout the cavernous carotid bilaterally. Moderate stenosis in the A2 on the right. Mild stenosis left A2. Middle cerebral arteries patent without significant stenosis or occlusion. Negative for cerebral aneurysm. IMPRESSION: Negative for acute infarct Chronic right PCA infarct.  Chronic ischemia in the white matter Extensive intracranial atherosclerotic disease. Electronically Signed   By: Franchot Gallo M.D.   On: 12/23/2018 17:46    Medications:   .  stroke: mapping our early stages of recovery book   Does not apply Once  . ALPRAZolam  0.5 mg Oral Daily  . ALPRAZolam  1 mg Oral QHS  . apixaban  5 mg Oral BID  . aspirin  150 mg Rectal Daily   Or  . aspirin  81 mg Oral Daily  . buPROPion  150 mg Oral Daily  . clopidogrel  75 mg Oral QHS  .  DULoxetine  30 mg Oral Daily  . gabapentin  300 mg Oral QHS  . gabapentin  600 mg Oral Daily  . insulin aspart  0-15 Units Subcutaneous TID WC  . insulin aspart  0-5 Units Subcutaneous QHS  . insulin glargine  38 Units Subcutaneous QHS  . mometasone-formoterol  2 puff Inhalation BID  . pantoprazole  40 mg Oral Daily  . ranolazine  500 mg Oral BID  . rosuvastatin  40 mg Oral QHS  . zolpidem  5 mg Oral QHS   Continuous Infusions: . sodium chloride 50 mL/hr at 12/24/18 0300     LOS: 0 days   Geradine Girt  [facility]   How to contact the [facility] Attending or Consulting provider [place] or covering provider during after hours [place], for this patient?  1. Check the care team in [facility] and look for a) attending/consulting [facility] provider listed and b) the [facility] team listed 2. Log into [email or web address] and use  [place]'s universal password to access. If you do not have the password, please contact the hospital operator. 3. Locate the [facility] provider you are looking for under [facility] and page to a number that you can be directly reached. 4. If you still have difficulty reaching the provider, please page the [facility] (Director on Call) for the Hospitalists listed on amion for assistance.  12/24/2018, 1:47 PM

## 2018-12-24 NOTE — Procedures (Signed)
ELECTROENCEPHALOGRAM REPORT   Patient: [name] [name]       Room #: 2E26S EEG No. ID: [id number] Age: 73 y.o.        Sex: female Referring Physician: Eliseo Squires Report Date:  12/24/2018        Interpreting Physician: Alexis Goodell  History: [name] [name] is an 73 y.o. female with starring spells  Medications:  Xanax, Eliquis, ASA, Wellbutrin, Plavix, Cymbalta. Neurontin, Insulin, Dulera, Ranexa, Crestor, Ambien  Conditions of Recording:  This is a 21 channel routine scalp EEG performed with bipolar and monopolar montages arranged in accordance to the international 10/20 system of electrode placement. One channel was dedicated to EKG recording.  The patient is in the awake, drowsy and asleep states.  Description:  The waking background activity is only achieved briefly.  It consists of a low voltage, symmetrical, fairly well organized, 9 Hz alpha activity, seen from the parieto-occipital and posterior temporal regions.  Low voltage fast activity, poorly organized, is seen anteriorly and is at times superimposed on more posterior regions.  A mixture of theta and alpha rhythms are seen from the central and temporal regions. The patient drowses  And sleeps for the majority of the recording.  With drowse there is slowing to irregular, low voltage theta and beta activity.   The patient goes in to a light sleep with symmetrical sleep spindles, vertex central sharp transients and irregular slow activity.  No epileptiform activity is noted.  No starring spells were captured Hyperventilation and intermittent photic stimulation were not performed.   IMPRESSION: Normal electroencephalogram, awake and asleep. There are no focal lateralizing or epileptiform features.   Alexis Goodell, MD Neurology [phone] 12/24/2018, 1:47 PM

## 2018-12-25 ENCOUNTER — Inpatient Hospital Stay (HOSPITAL_COMMUNITY): Payer: Medicare Other

## 2018-12-25 ENCOUNTER — Encounter (HOSPITAL_COMMUNITY): Payer: Medicare Other

## 2018-12-25 DIAGNOSIS — I480 Paroxysmal atrial fibrillation: Secondary | ICD-10-CM | POA: Diagnosis present

## 2018-12-25 DIAGNOSIS — I5042 Chronic combined systolic (congestive) and diastolic (congestive) heart failure: Secondary | ICD-10-CM | POA: Diagnosis present

## 2018-12-25 DIAGNOSIS — N1832 Chronic kidney disease, stage 3b: Secondary | ICD-10-CM | POA: Diagnosis present

## 2018-12-25 DIAGNOSIS — R29818 Other symptoms and signs involving the nervous system: Secondary | ICD-10-CM | POA: Diagnosis present

## 2018-12-25 DIAGNOSIS — R270 Ataxia, unspecified: Secondary | ICD-10-CM | POA: Diagnosis present

## 2018-12-25 DIAGNOSIS — R29700 NIHSS score 0: Secondary | ICD-10-CM | POA: Diagnosis present

## 2018-12-25 DIAGNOSIS — R441 Visual hallucinations: Secondary | ICD-10-CM | POA: Diagnosis present

## 2018-12-25 DIAGNOSIS — R404 Transient alteration of awareness: Secondary | ICD-10-CM | POA: Diagnosis not present

## 2018-12-25 DIAGNOSIS — I130 Hypertensive heart and chronic kidney disease with heart failure and stage 1 through stage 4 chronic kidney disease, or unspecified chronic kidney disease: Secondary | ICD-10-CM | POA: Diagnosis present

## 2018-12-25 DIAGNOSIS — I447 Left bundle-branch block, unspecified: Secondary | ICD-10-CM | POA: Diagnosis present

## 2018-12-25 DIAGNOSIS — Z20828 Contact with and (suspected) exposure to other viral communicable diseases: Secondary | ICD-10-CM | POA: Diagnosis present

## 2018-12-25 DIAGNOSIS — R4781 Slurred speech: Secondary | ICD-10-CM | POA: Diagnosis present

## 2018-12-25 DIAGNOSIS — I10 Essential (primary) hypertension: Secondary | ICD-10-CM | POA: Diagnosis not present

## 2018-12-25 DIAGNOSIS — G459 Transient cerebral ischemic attack, unspecified: Secondary | ICD-10-CM | POA: Diagnosis present

## 2018-12-25 DIAGNOSIS — R443 Hallucinations, unspecified: Secondary | ICD-10-CM | POA: Diagnosis present

## 2018-12-25 DIAGNOSIS — E669 Obesity, unspecified: Secondary | ICD-10-CM | POA: Diagnosis present

## 2018-12-25 DIAGNOSIS — Z6841 Body Mass Index (BMI) 40.0 and over, adult: Secondary | ICD-10-CM | POA: Diagnosis not present

## 2018-12-25 DIAGNOSIS — E1122 Type 2 diabetes mellitus with diabetic chronic kidney disease: Secondary | ICD-10-CM | POA: Diagnosis present

## 2018-12-25 DIAGNOSIS — E785 Hyperlipidemia, unspecified: Secondary | ICD-10-CM | POA: Diagnosis present

## 2018-12-25 DIAGNOSIS — J9611 Chronic respiratory failure with hypoxia: Secondary | ICD-10-CM | POA: Diagnosis present

## 2018-12-25 DIAGNOSIS — I672 Cerebral atherosclerosis: Secondary | ICD-10-CM | POA: Diagnosis present

## 2018-12-25 DIAGNOSIS — Z9981 Dependence on supplemental oxygen: Secondary | ICD-10-CM | POA: Diagnosis not present

## 2018-12-25 DIAGNOSIS — Z7901 Long term (current) use of anticoagulants: Secondary | ICD-10-CM | POA: Diagnosis not present

## 2018-12-25 DIAGNOSIS — G4733 Obstructive sleep apnea (adult) (pediatric): Secondary | ICD-10-CM | POA: Diagnosis present

## 2018-12-25 DIAGNOSIS — R29810 Facial weakness: Secondary | ICD-10-CM | POA: Diagnosis present

## 2018-12-25 DIAGNOSIS — J449 Chronic obstructive pulmonary disease, unspecified: Secondary | ICD-10-CM | POA: Diagnosis present

## 2018-12-25 DIAGNOSIS — E1151 Type 2 diabetes mellitus with diabetic peripheral angiopathy without gangrene: Secondary | ICD-10-CM | POA: Diagnosis present

## 2018-12-25 LAB — GLUCOSE, CAPILLARY
Glucose-Capillary: 118 mg/dL — ABNORMAL HIGH (ref 70–99)
Glucose-Capillary: 175 mg/dL — ABNORMAL HIGH (ref 70–99)
Glucose-Capillary: 185 mg/dL — ABNORMAL HIGH (ref 70–99)
Glucose-Capillary: 195 mg/dL — ABNORMAL HIGH (ref 70–99)

## 2018-12-25 MED ORDER — METOPROLOL SUCCINATE ER 25 MG PO TB24
50.0000 mg | ORAL_TABLET | Freq: Every day | ORAL | Status: DC
  Administered 2018-12-25 – 2018-12-26 (×2): 50 mg via ORAL
  Filled 2018-12-25 (×2): qty 2

## 2018-12-25 MED ORDER — GLUCERNA SHAKE PO LIQD
237.0000 mL | Freq: Two times a day (BID) | ORAL | Status: DC
  Administered 2018-12-25 – 2018-12-26 (×3): 237 mL via ORAL

## 2018-12-25 MED ORDER — TORSEMIDE 20 MG PO TABS
40.0000 mg | ORAL_TABLET | Freq: Every day | ORAL | Status: DC
  Administered 2018-12-26: 40 mg via ORAL
  Filled 2018-12-25: qty 2

## 2018-12-25 MED ORDER — SPIRONOLACTONE 25 MG PO TABS
25.0000 mg | ORAL_TABLET | Freq: Every day | ORAL | Status: DC
  Administered 2018-12-26: 25 mg via ORAL
  Filled 2018-12-25: qty 1

## 2018-12-25 NOTE — Progress Notes (Signed)
LTM set up. Educated patient and nurse on event button; advised nurse that patient will need bedside commode.

## 2018-12-25 NOTE — Care Management Obs Status (Signed)
[facility] NOTIFICATION   Patient Details  Name: IYSIS GERMAIN [name]: [id number] Date of Birth: Dec 11, 1945   Medicare Observation Status Notification Given:       Pollie Friar, RN 12/25/2018, 11:24 AM

## 2018-12-25 NOTE — Progress Notes (Addendum)
Progress Note    [name] [name]  GHW:[id number] DOB: Aug 17, 1945  DOA: 12/23/2018 PCP: Derinda Late, MD    Brief Narrative:     Medical records reviewed and are as summarized below:  [name] [name] is an 73 y.o. female with medical history significant of CVA; DM; PVD; OSA not on CPAP; obesity; dementia; HTN; HLD;  Chronic low back pain; CKD; home O2; and chronic combined CHF presenting with "TIAs".  The patient's daughter provides most of the history.  She reports that the patient normally has about 10 "ghost TIAs" per day, where she has a dead stare and zones out.  She also has intermittent TIAs about once a month, but these have been occurring faster and stronger than usual.  She has had about 9 episodes in the last 10 days.  She has had excessive slurred speech, difficulty finishing her sentences, audio and visual hallucinations, n/v, gasping respirations, difficulty with memory, L face and eye drooping, LE tingling, ataxia, and weakness with episodes.  Her left "carotid was distended" last Thursday.  She also had trouble seeing one day.     Assessment/Plan:   Principal Problem:   TIA (transient ischemic attack) Active Problems:   Diabetes mellitus type 2, controlled, with complications ([place])   Hyperlipemia   Essential hypertension   Atrial fibrillation (HCC)   Chronic combined systolic (congestive) and diastolic (congestive) heart failure (HCC)   CKD (chronic kidney disease), stage III   Neurologic abnormality   Neurologic event: TIA vs seizures -Patient with known severe intracranial stenosis presenting with symptoms atypical for TIA -She has known carotid stenosis with h/o L stent placement in 2015 -She had recent carotid US on 10/12 with mild R ICA stenosis and no restenosis on the L; it seems unlikely that stenosis would have developed in such a short period of time, but neurology has recommended repeat carotid US -Telemetry monitoring -MRI/MRA: no acute  infarct -Echo:  -Continue Plavix, Eliquis -Neurology consult appreciated-- plan for overnight EEG -short EEG normal -PT home health/RN  HTN -resume home meds  HLD -Continue Crestor 40 mg daily -Hold Zetia due to limited inpatient utility   DM -Recent A1c shows reasonably good control -Continue Lantus -Will order moderate-scale SSI  parox Afib on Eliquis -resume toprol -Continue Eliquis  Chronic combined CHF -Appears to be compensated at this time -resume Demadex and Aldactone in AM  Stage 3b CKD -Appears to be stable at this time -Repeat BMP in AM  CAD -No c/o angina pain at this time -Continue Ranexa  COPD -Continue home meds including prn O2, Duoneb, Advair, Pulmicort  obesity Body mass index is 41.81 kg/m.   Family Communication/Anticipated D/C date and plan/Code Status   DVT prophylaxis: eliquis Code Status: Full Code.  Family Communication:  Disposition Plan: overnight EEG   Medical Consultants:    Neuro  Subjective:   Feels well this AM  Objective:    Vitals:   12/24/18 2340 12/25/18 0309 12/25/18 0818 12/25/18 0850  BP: (!) 127/40 (!) 130/48  (!) 153/61  Pulse: (!) 58 (!) 59  62  Resp: 18 (!) 24  16  Temp: 98 F (36.7 C) (!) 97.4 F (36.3 C)  97.8 F (36.6 C)  TempSrc: Oral Oral  Oral  SpO2: 94% 93% 100% 100%  Weight:      Height:        Intake/Output Summary (Last 24 hours) at 12/25/2018 1027 Last data filed at 12/25/2018 0900 Gross per 24 hour  Intake  927.48 ml  Output -  Net 927.48 ml   Filed Weights   12/23/18 2025  Weight: 103.7 kg    Exam: In chair, eating omlet rrr No increased work of breathing Alert and oriented Pleasant and cooperative  Data Reviewed:   I have personally reviewed following labs and imaging studies:  Labs: Labs show the following:   Basic Metabolic Panel: Recent Labs  Lab 12/23/18 1339  NA 138  K 3.8  CL 98  CO2 31  GLUCOSE 207*  BUN 19  CREATININE 1.47*   CALCIUM 9.3   GFR Estimated Creatinine Clearance: 39.6 mL/min (A) (by C-G formula based on SCr of 1.47 mg/dL (H)). Liver Function Tests: Recent Labs  Lab 12/23/18 1339  AST 23  ALT 17  ALKPHOS 84  BILITOT 0.5  PROT 6.7  ALBUMIN 3.5   No results for input(s): LIPASE, AMYLASE in the last 168 hours. No results for input(s): AMMONIA in the last 168 hours. Coagulation profile Recent Labs  Lab 12/23/18 1339  INR 1.6*    CBC: Recent Labs  Lab 12/23/18 1339  WBC 9.2  NEUTROABS 6.8  HGB 10.1*  HCT 34.7*  MCV 83.8  PLT 203   Cardiac Enzymes: No results for input(s): CKTOTAL, CKMB, CKMBINDEX, TROPONINI in the last 168 hours. BNP (last 3 results) No results for input(s): PROBNP in the last 8760 hours. CBG: Recent Labs  Lab 12/24/18 0641 12/24/18 1149 12/24/18 1608 12/24/18 2144 12/25/18 0629  GLUCAP 135* 157* 182* 198* 118*   D-Dimer: No results for input(s): DDIMER in the last 72 hours. Hgb A1c: No results for input(s): HGBA1C in the last 72 hours. Lipid Profile: Recent Labs    12/24/18 0336  CHOL 90  HDL 34*  LDLCALC 38  TRIG 90  CHOLHDL 2.6   Thyroid function studies: No results for input(s): TSH, T4TOTAL, T3FREE, THYROIDAB in the last 72 hours.  Invalid input(s): FREET3 Anemia work up: No results for input(s): VITAMINB12, FOLATE, FERRITIN, TIBC, IRON, RETICCTPCT in the last 72 hours. Sepsis Labs: Recent Labs  Lab 12/23/18 1339  WBC 9.2    Microbiology Recent Results (from the past 240 hour(s))  SARS CORONAVIRUS 2 (TAT 6-24 HRS) Nasopharyngeal Nasopharyngeal Swab     Status: None   Collection Time: 12/23/18  9:21 PM   Specimen: Nasopharyngeal Swab  Result Value Ref Range Status   SARS Coronavirus 2 NEGATIVE NEGATIVE Final    Comment: (NOTE) SARS-CoV-2 target nucleic acids are NOT DETECTED. The SARS-CoV-2 RNA is generally detectable in upper and lower respiratory specimens during the acute phase of infection. Negative results do not  preclude SARS-CoV-2 infection, do not rule out co-infections with other pathogens, and should not be used as the sole basis for treatment or other patient management decisions. Negative results must be combined with clinical observations, patient history, and epidemiological information. The expected result is Negative. Fact Sheet for Patients: SugarRoll.be Fact Sheet for Healthcare Providers: https://www.woods-mathews.com/ This test is not yet approved or cleared by the [place] FDA and  has been authorized for detection and/or diagnosis of SARS-CoV-2 by FDA under an Emergency Use Authorization (EUA). This EUA will remain  in effect (meaning this test can be used) for the duration of the COVID-19 declaration under Section 56 4(b)(1) of the Act, 21 U.S.C. section 360bbb-3(b)(1), unless the authorization is terminated or revoked sooner. Performed at [facility], [place] [address] [place], [place] [address]     Procedures and diagnostic studies:  Mr Angio Head Wo  Contrast  Result Date: 12/23/2018 CLINICAL DATA:  TIA EXAM: MRI HEAD WITHOUT CONTRAST MRA HEAD WITHOUT CONTRAST TECHNIQUE: Multiplanar, multiecho pulse sequences of the brain and surrounding structures were obtained without intravenous contrast. Angiographic images of the head were obtained using MRA technique without contrast. COMPARISON:  CT head 09/18/2018 FINDINGS: MRI HEAD FINDINGS Brain: Negative for acute infarct Chronic right posterior cerebral artery infarct involving the inferior right occipital lobe. Chronic ischemic change in the white matter. Scattered areas of chronic microhemorrhage in the left cerebral hemisphere. Vascular: Normal arterial flow voids. Skull and upper cervical spine: No focal bone lesion. Anterolisthesis C3-4 with spurring and spinal stenosis. Cord flattening. Sinuses/Orbits: Negative Other: None MRA HEAD FINDINGS Both vertebral arteries patent to the  basilar. PICA patent bilaterally with scattered atherosclerotic disease. Mild to moderate stenosis in the proximal and mid basilar. Superior cerebellar and posterior cerebral arteries patent bilaterally. Multiple areas of moderate stenosis in the distal posterior cerebral artery on the right. Extensive atherosclerotic irregularity and moderate stenosis throughout the cavernous carotid bilaterally. Moderate stenosis in the A2 on the right. Mild stenosis left A2. Middle cerebral arteries patent without significant stenosis or occlusion. Negative for cerebral aneurysm. IMPRESSION: Negative for acute infarct Chronic right PCA infarct.  Chronic ischemia in the white matter Extensive intracranial atherosclerotic disease. Electronically Signed   By: Franchot Gallo M.D.   On: 12/23/2018 17:46   Mr Brain Wo Contrast  Result Date: 12/23/2018 CLINICAL DATA:  TIA EXAM: MRI HEAD WITHOUT CONTRAST MRA HEAD WITHOUT CONTRAST TECHNIQUE: Multiplanar, multiecho pulse sequences of the brain and surrounding structures were obtained without intravenous contrast. Angiographic images of the head were obtained using MRA technique without contrast. COMPARISON:  CT head 09/18/2018 FINDINGS: MRI HEAD FINDINGS Brain: Negative for acute infarct Chronic right posterior cerebral artery infarct involving the inferior right occipital lobe. Chronic ischemic change in the white matter. Scattered areas of chronic microhemorrhage in the left cerebral hemisphere. Vascular: Normal arterial flow voids. Skull and upper cervical spine: No focal bone lesion. Anterolisthesis C3-4 with spurring and spinal stenosis. Cord flattening. Sinuses/Orbits: Negative Other: None MRA HEAD FINDINGS Both vertebral arteries patent to the basilar. PICA patent bilaterally with scattered atherosclerotic disease. Mild to moderate stenosis in the proximal and mid basilar. Superior cerebellar and posterior cerebral arteries patent bilaterally. Multiple areas of moderate  stenosis in the distal posterior cerebral artery on the right. Extensive atherosclerotic irregularity and moderate stenosis throughout the cavernous carotid bilaterally. Moderate stenosis in the A2 on the right. Mild stenosis left A2. Middle cerebral arteries patent without significant stenosis or occlusion. Negative for cerebral aneurysm. IMPRESSION: Negative for acute infarct Chronic right PCA infarct.  Chronic ischemia in the white matter Extensive intracranial atherosclerotic disease. Electronically Signed   By: Franchot Gallo M.D.   On: 12/23/2018 17:46    Medications:   .  stroke: mapping our early stages of recovery book   Does not apply Once  . ALPRAZolam  0.5 mg Oral Daily  . ALPRAZolam  1 mg Oral QHS  . apixaban  5 mg Oral BID  . aspirin  150 mg Rectal Daily   Or  . aspirin  81 mg Oral Daily  . buPROPion  150 mg Oral Daily  . clopidogrel  75 mg Oral QHS  . DULoxetine  30 mg Oral Daily  . gabapentin  300 mg Oral QHS  . gabapentin  600 mg Oral Daily  . insulin aspart  0-15 Units Subcutaneous TID WC  . insulin aspart  0-5 Units Subcutaneous  QHS  . insulin glargine  38 Units Subcutaneous QHS  . mometasone-formoterol  2 puff Inhalation BID  . pantoprazole  40 mg Oral Daily  . ranolazine  500 mg Oral BID  . rosuvastatin  40 mg Oral QHS  . zolpidem  5 mg Oral QHS   Continuous Infusions:    LOS: 0 days   Geradine Girt  [facility]   How to contact the [facility] Attending or Consulting provider [place] or covering provider during after hours [place], for this patient?  1. Check the care team in [facility] and look for a) attending/consulting [facility] provider listed and b) the [facility] team listed 2. Log into [email or web address] and use 's universal password to access. If you do not have the password, please contact the hospital operator. 3. Locate the [facility] provider you are looking for under [facility] and page to a number that you can be directly reached. 4. If you still have  difficulty reaching the provider, please page the [facility] (Director on Call) for the Hospitalists listed on amion for assistance.  12/25/2018, 10:27 AM

## 2018-12-25 NOTE — Discharge Instructions (Addendum)

## 2018-12-25 NOTE — Progress Notes (Addendum)
Initial Nutrition Assessment  DOCUMENTATION CODES:   Morbid obesity  INTERVENTION:  Glucerna BID (Each supplement provides 220 calories and 10 grams of protein)  NUTRITION DIAGNOSIS:   Inadequate oral intake related to poor appetite as evidenced by per patient/family report.  GOAL:   Patient will meet greater than or equal to 90% of their needs  MONITOR:   PO intake, Supplement acceptance, Labs, Weight trends, Skin, I & O's  REASON FOR ASSESSMENT:   Consult Assessment of nutrition requirement/status  ASSESSMENT:   73 y.o. female with medical history significant of CVA; DM; PVD; GERD; obesity; dementia; HTN; HLD; CKD. Admitted for TIA.  Pt States that she does not feel very hungry, but was able to consume over 50% of her meal tray this morning. Endorses feeling better after having a BM this morning. Reports having a good appetite 70% of the time PTA.  Typical meal pattern: Breakfast- eggs and toast; Lunch-typically skips lunch; Dinner- protein and vegetables. Does not typically snack throughout the day and drinks water or diet sprite as it has the least amount of sodium of all the soda she looked at. Pt understands the importance of maintaining good calorie and protein intake and was educated on supplements that are appropriate for DM. Per documented weight history, weights have been trending downward from 10/17/18. Pt is -5% body weight in 2 months which is not significant for time frame. Labs and Medications reviewed.   NUTRITION - FOCUSED PHYSICAL EXAM:    Most Recent Value  Orbital Region  No depletion  Upper Arm Region  No depletion  Thoracic and Lumbar Region  No depletion  Buccal Region  No depletion  Temple Region  No depletion  Clavicle Bone Region  No depletion  Clavicle and Acromion Bone Region  No depletion  Scapular Bone Region  No depletion  Dorsal Hand  Mild depletion  Patellar Region  No depletion  Anterior Thigh Region  No depletion  Posterior Calf  Region  No depletion  Edema (RD Assessment)  None  Hair  Reviewed  Eyes  Reviewed  Mouth  Reviewed  Skin  Reviewed  Nails  Reviewed       Diet Order:   Diet Order            Diet heart healthy/carb modified Fluid consistency: Thin  Diet effective ____              EDUCATION NEEDS:   Education needs have been addressed  Skin:  Skin Assessment: Reviewed RN Assessment  Last BM:  11/23 per pt.  Height:   Ht Readings from Last 1 Encounters:  12/23/18 5\' 2"  (1.575 m)    Weight:   Wt Readings from Last 1 Encounters:  12/23/18 103.7 kg    Ideal Body Weight:  50 kg  BMI:  Body mass index is 41.81 kg/m.  Estimated Nutritional Needs:   Kcal:  1700-1900  Protein:  85-95 grams  Fluid:  >/= 1.7 L  Meda Klinefelter, Dietetic Intern

## 2018-12-25 NOTE — Evaluation (Signed)
Speech Language Pathology Evaluation Patient Details Name: [name] [name] [name]: [id number] DOB: 06/15/1945 Today's Date: 12/25/2018 Time: 7026-3785 SLP Time Calculation (min) (ACUTE ONLY): 19 min  Problem List:  Patient Active Problem List   Diagnosis Date Noted  . Neurologic abnormality 12/25/2018  . Debility 09/21/2018  . Hypotension 09/18/2018  . Bradycardia 09/18/2018  . AKI (acute kidney injury) ([place]) 09/18/2018  . Pressure injury of skin 09/18/2018  . Ischemic cerebrovascular accident (CVA) of frontal lobe ([place]) 09/13/2018  . Fall 09/09/2018  . Stroke ([place]) 09/09/2018  . Type II diabetes mellitus with renal manifestations ([place]) 09/09/2018  . CKD (chronic kidney disease), stage III 09/09/2018  . Sepsis ([place]) 09/09/2018  . Depression with anxiety 09/09/2018  . Slurred speech 09/09/2018  . Ankle fracture, left 05/16/2018  . Amiodarone pulmonary toxicity   . Physical deconditioning   . 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 Acute on chronic congestive heart failure ([place])   . Chronic combined systolic (congestive) and diastolic (congestive) heart failure ([place]) 01/16/2018  . Acute on chronic diastolic CHF (congestive heart failure) ([place]) 01/16/2018  . Atrial fibrillation ([place]) 10/21/2017  . CAD (coronary artery disease) of bypass graft 10/20/2017  . Coronary artery disease of bypass graft of native heart with stable angina pectoris ([place])   . Chronic low back pain 08/24/2016  . Pain of left thumb 08/05/2016  . Nocturnal hypoxia 06/02/2016  . Hoarseness 04/05/2016  . Laryngopharyngeal reflux (LPR) 04/05/2016  . Pharyngoesophageal dysphagia 04/05/2016  . Angina decubitus ([place]) 05/22/2015  . Chronic insomnia 05/06/2015  . Common migraine 05/14/2014  . Abnormality of gait 05/14/2014  . Memory change 05/14/2014  . Aneurysm, cerebral, nonruptured 05/14/2014  . Cramp of limb-Left neck 05/10/2014  . Hematemesis with nausea   . Vomiting blood   . Dizziness and giddiness 09/21/2013  .  Atypical chest pain 07/18/2013  . Unstable angina ([place]) 04/09/2013  . Carotid artery stenosis, symptomatic 03/20/2013  . Cerebrovascular disease 07/10/2012  . Cerebral artery occlusion with cerebral infarction ([place]) 07/10/2012  . Mitral regurgitation 04/15/2012  . TIA (transient ischemic attack) 04/15/2012  . Occlusion and stenosis of carotid artery without mention of cerebral infarction 08/18/2011  . Bariatric surgery status 06/17/2011  . Speech abnormality 03/22/2011  . Dyspnea 02/24/2011  . PAPILLARY MUSCLE DYSFUNCTION, NON-RHEUMATIC 10/09/2008  . UNSPECIFIED VITAMIN D DEFICIENCY 10/24/2007  . MYOCARDIAL INFARCTION, HX OF 10/24/2007  . PERSISTENT VOMITING 10/24/2007  . OSTEOARTHRITIS 10/24/2007  . MIGRAINES, HX OF 10/24/2007  . Diabetes mellitus type 2, controlled, with complications ([place]) 88/50/2774  . Hyperlipemia 01/16/2007  . Obesity-post failed open gastroplasty 1984  01/16/2007  . OBSTRUCTIVE SLEEP APNEA 01/16/2007  . Essential hypertension 01/16/2007  . Coronary atherosclerosis 01/16/2007  . Asthma 01/16/2007  . GERD 01/16/2007  . VENTRAL HERNIA 01/16/2007   Past Medical History:  Past Medical History:  Diagnosis Date  . Anemia    hx  . Anxiety   . Asthma   . Basal cell carcinoma 05/2014   "left shoulder"  . Bundle branch block, left    chronic/notes 07/18/2013  . CHF (congestive heart failure) ([place])   . Chronic insomnia 05/06/2015  . Chronic kidney disease    frequency, sees dr Jamal Maes every 4 to 6 months (01/16/2018)  . Chronic lower back pain   . Claustrophobia   . Common migraine 05/14/2014  . Coronary artery disease    MI in 2001, 2002, 2006, 2011, 2014  . Depression   . GERD (gastroesophageal reflux disease)   .  H/O hiatal hernia   . Headache    "at least 2/month" (01/16/2018)  . Heart murmur   . Hyperlipidemia   . Hypertension   . Memory change 05/14/2014  . Migraine    "5-6/year"  (01/16/2018)  . Obesity 01-2010  . Obstructive sleep  apnea    "can't wear machine; I have claustrophobia" (01/16/2018), states she had a 2nd sleep study and does not have sleep apnea, her O2 decreases and now is on 2 L of O2 at night.  . On home oxygen therapy    "2L at night and prn during daytime" (01/16/2018)  . Osteoarthritis    "knees and hands" (01/16/2018)  . Peripheral vascular disease (HCC)    ? numbness, tingling arms and legs  . PONV (postoperative nausea and vomiting)   . Stroke [facility])    2014, 2015, 2016  . Type II diabetes mellitus ([place])   . Ventral hernia    hx of   Past Surgical History:  Past Surgical History:  Procedure Laterality Date  . ANKLE FRACTURE SURGERY Left 1970's  . APPENDECTOMY  1970's   w/hysterectomy  . BASAL CELL CARCINOMA EXCISION Left 05/2014   "shoulder" (01/16/2018)  . CARDIAC CATHETERIZATION  10/10/2012   Dr [name].  Marland Kitchen CARDIAC CATHETERIZATION N/A 05/29/2015   Procedure: Right/Left Heart Cath and Coronary/Graft Angiography;  Surgeon: Larey Dresser, MD;  Location: [facility] CV LAB;  Service: Cardiovascular;  Laterality: N/A;  . CAROTID ENDARTERECTOMY Left 03/2013  . CAROTID STENT INSERTION Left 03/20/2013   Procedure: CAROTID STENT INSERTION;  Surgeon: [name], MD;  Location: [facility] CATH LAB;  Service: Cardiovascular;  Laterality: Left;  internal carotid  . CEREBRAL ANGIOGRAM N/A 04/05/2011   Procedure: CEREBRAL ANGIOGRAM;  Surgeon: Angelia Mould, MD;  Location: [facility] CATH LAB;  Service: Cardiovascular;  Laterality: N/A;  . CHOLECYSTECTOMY OPEN  2004  . CORONARY ANGIOPLASTY WITH STENT PLACEMENT  01,02,05,06,07,08,11; 04/24/2013   "I've probably got ~ 10 stents by now" (04/24/2013)  . CORONARY ANGIOPLASTY WITH STENT PLACEMENT  06/13/2013   "got 4 stents today" (06/13/2013)  . CORONARY ARTERY BYPASS GRAFT  1220/11   "CABG X5"  . CORONARY STENT INTERVENTION N/A 10/20/2017   Procedure: CORONARY STENT INTERVENTION;  Surgeon: Troy Sine, MD;  Location: [facility] CV LAB;  Service:  Cardiovascular;  Laterality: N/A;  . ESOPHAGOGASTRODUODENOSCOPY  08/03/2011   Procedure: ESOPHAGOGASTRODUODENOSCOPY (EGD);  Surgeon: Shann Medal, MD;  Location: [facility];  Service: General;  Laterality: N/A;  . ESOPHAGOGASTRODUODENOSCOPY (EGD) WITH PROPOFOL N/A 03/11/2014   Procedure: ESOPHAGOGASTRODUODENOSCOPY (EGD) WITH PROPOFOL;  Surgeon: Lafayette Dragon, MD;  Location: [facility];  Service: Endoscopy;  Laterality: N/A;  . FRACTURE SURGERY    . gall stone removal  05/2003  . GASTRIC RESTRICTION SURGERY  1984   "stapeling"  . HERNIA REPAIR  2004   "in my stomach; had OR on it twice", wire mesh on 1 hernia  . LEFT HEART CATH AND CORS/GRAFTS ANGIOGRAPHY N/A 07/09/2016   Procedure: LEFT HEART CATH AND CORS/GRAFTS ANGIOGRAPHY;  Surgeon: Larey Dresser, MD;  Location: [facility] CV LAB;  Service: Cardiovascular;  Laterality: N/A;  . ORIF ANKLE FRACTURE Left 05/16/2018  . ORIF ANKLE FRACTURE Left 05/16/2018   Procedure: OPEN REDUCTION INTERNAL FIXATION (ORIF) Left ankle with possible syndesmosis fixation;  Surgeon: Nicholes Stairs, MD;  Location: [facility];  Service: Orthopedics;  Laterality: Left;  129min  . OVARY SURGERY  1970's   "tumor removed"  . PERCUTANEOUS CORONARY STENT  INTERVENTION (PCI-S) N/A 06/13/2013   Procedure: PERCUTANEOUS CORONARY STENT INTERVENTION (PCI-S);  Surgeon: [name], MD;  Location: [facility] CATH LAB;  Service: Cardiovascular;  Laterality: N/A;  . PERCUTANEOUS STENT INTERVENTION N/A 04/24/2013   Procedure: PERCUTANEOUS STENT INTERVENTION;  Surgeon: [name], MD;  Location: [facility] CATH LAB;  Service: Cardiovascular;  Laterality: N/A;  . RIGHT/LEFT HEART CATH AND CORONARY/GRAFT ANGIOGRAPHY N/A 10/20/2017   Procedure: RIGHT/LEFT HEART CATH AND CORONARY/GRAFT ANGIOGRAPHY;  Surgeon: Larey Dresser, MD;  Location: [place] CV LAB;  Service: Cardiovascular;  Laterality: N/A;  . ROOT CANAL  10/2000  . TIBIA FRACTURE SURGERY Right 1970's   rods and pins  .  TOOTH EXTRACTION     "1 on the upper; wisdom tooth on the lower" (01/16/2018)  . TOTAL ABDOMINAL HYSTERECTOMY  1970's   w/ appendectomy   HPI:  AYIANNA DARNOLD is a 73 y.o. female with a PMHx significant for DM2, PVD, CVA (2014, 2015, 2016), migraine, HTN, HLD, BCC (2016), CHF, L ankle ORIF (05/2018) and CAD who presented to the [facility] ED with c/o unsteady gait and slurred speech. Family and patient then described more complex events labeled by them as TIA's, stating that she had approximately 9 such TIAs in the past 10 days. The events have stereotyped features, but vary significantly in duration and are associated with complex behaviors. MRI negative for acute infarct; showing chronic right PCA infarct.  Pt with baseline cognitive deficits, particularly in STM evidenced via SLE on 09/11/2018.      Assessment / Plan / Recommendation Clinical Impression  Pt completed a cognitive-linguistic evaluation in the setting of a possible TIA.  Pt reported that she lives with her husband and that between him and her daughter, she has someone with her 24/7 each day for assistance with ADLs and IADLs.  She completed the [facility] Cognitive Assessment [facility]) on 09/11/2018 which yielded a score of 23/30 indicating cognitive deficits.  Pt completed the [place] again today for a comparison and she scored 27/30 overall which is WNL (norm is >/= 26/30).  Pt showed particular improvement in short-term memory.  She reported intermittent, mild dysarthria; however, speech was >95% intelligible during this evaluation.  Pt exhibited mild difficulty with thought organization and she reported occasional, minimal anomia; however, no expressive or receptive language deficits were observed in this evaluation.  No further skilled ST is warranted at this time.  SLP educated pt regarding results/recommendations and she verbalized understanding.  Please re-consult if additional needs arise.    SLP Assessment  SLP Recommendation/Assessment:  Patient does not need any further Speech Lanaguage Pathology Services SLP Visit Diagnosis: Cognitive communication deficit (R41.841)    Follow Up Recommendations  None    Frequency and Duration           SLP Evaluation Cognition  Overall Cognitive Status: Within Functional Limits for tasks assessed Arousal/Alertness: Awake/alert Orientation Level: Oriented X4 Attention: Sustained;Selective Sustained Attention: Appears intact Selective Attention: Appears intact Memory: Appears intact Awareness: Appears intact Problem Solving: Appears intact Executive Function: Sequencing;Organizing;Reasoning;Decision Making Reasoning: Appears intact Sequencing: Appears intact Organizing: Appears intact Decision Making: Appears intact Safety/Judgment: Appears intact       Comprehension  Auditory Comprehension Overall Auditory Comprehension: Appears within functional limits for tasks assessed    Expression Expression Primary Mode of Expression: Verbal Verbal Expression Overall Verbal Expression: Appears within functional limits for tasks assessed Written Expression Dominant Hand: Right   Oral / Motor  Oral Motor/Sensory Function Overall Oral Motor/Sensory Function: Within functional  limits Motor Speech Overall Motor Speech: Appears within functional limits for tasks assessed   GO                   Colin Mulders., M.S., [place] Office: [phone]  [place] 12/25/2018, 10:55 AM

## 2018-12-25 NOTE — Progress Notes (Signed)
Subjective: She reports 2 staring spells since admission. No hallucinations.   Exam: Vitals:   12/25/18 0818 12/25/18 0850  BP:  (!) 153/61  Pulse:  62  Resp:  16  Temp:  97.8 F (36.6 C)  SpO2: 100% 100%   Gen: In bed, NAD Resp: non-labored breathing, no acute distress  Neuro: MS: awake and alert, interactive and appropriate.   Pertinent Labs: No hypoglycemia.  UDS positive for benzodiazepines.   Impression: 73 year old female being evaluated for intermittent episodes of staring spells as well as visual hallucinations.  Given the frequency of the staring spells and the fact that they are continuing, I do think continuous EEG to further elucidate an etiology would be prudent.  Visual hallucinations are somewhat of a puzzle, I do wonder if she may be having a waxing/waning delirium related to medication use.  She is on multiple psychoactive medications including gabapentin, benzodiazepines, Wellbutrin, duloxetine.  With resolution of those symptoms, I think I would favor investigating her staring spells first.  Recommendations: 1) overnight EEG 2) neurology will follow  Roland Rack, MD [facility] [phone]  If 7pm- 7am, please page neurology on call as listed in [place].

## 2018-12-25 NOTE — TOC Initial Note (Signed)
Transition of Care [facility]) - Initial/Assessment Note    Patient Details  Name: [name] [name] [name]: [id number] Date of Birth: [birth year]  [facility]) CM/SW Contact:    Pollie Friar, RN Phone Number: 12/25/2018, 4:52 PM  Clinical Narrative:                 Pt with orders for [facility] services. Pt was active with [facility] services prior to admission and wants to continue with their services. Britney with [facility] updated.  No DME needs.  Pt has transportation home when medically ready.  Expected Discharge Plan: [place] Barriers to Discharge: Continued Medical Work up   Patient Goals and CMS Choice   CMS Medicare.gov Compare Post Acute Care list provided to:: Patient Choice offered to / list presented to : Patient  Expected Discharge Plan and Services Expected Discharge Plan: [place]   Discharge Planning Services: CM Consult Post Acute Care Choice: [place] arrangements for the past 2 months: Single Family Home                           HH Arranged: PT, RN [facility] Agency: [facility] Date [place]: 12/25/18   Representative spoke with at [employer]: [place]  Prior Living Arrangements/Services Living arrangements for the past 2 months: [place] Lives with:: Spouse Patient language and need for interpreter reviewed:: Yes Do you feel safe going back to the place where you live?: Yes      Need for Family Participation in Patient Care: Yes (Comment)(intermittent supervision) Care giver support system in place?: Yes (comment)(spouse) Current home services: DME(scale, BP machine and pulse ox, walker, shower seat, shower bars) Criminal Activity/Legal Involvement Pertinent to Current Situation/Hospitalization: No - Comment as needed  Activities of Daily Living      Permission Sought/Granted                  Emotional Assessment Appearance:: Appears stated  age Attitude/Demeanor/Rapport: Engaged Affect (typically observed): Accepting, Pleasant Orientation: : Oriented to Self, Oriented to Place, Oriented to  Time, Oriented to Situation   Psych Involvement: No (comment)  Admission diagnosis:  TIA (transient ischemic attack) [G45.9] Patient Active Problem List   Diagnosis Date Noted  . Neurologic abnormality 12/25/2018  . Debility 09/21/2018  . Hypotension 09/18/2018  . Bradycardia 09/18/2018  . AKI (acute kidney injury) ([place]) 09/18/2018  . Pressure injury of skin 09/18/2018  . Ischemic cerebrovascular accident (CVA) of frontal lobe ([place]) 09/13/2018  . Fall 09/09/2018  . Stroke ([place]) 09/09/2018  . Type II diabetes mellitus with renal manifestations ([place]) 09/09/2018  . CKD (chronic kidney disease), stage III 09/09/2018  . Sepsis ([place]) 09/09/2018  . Depression with anxiety 09/09/2018  . Slurred speech 09/09/2018  . Ankle fracture, left 05/16/2018  . Amiodarone pulmonary toxicity   . Physical deconditioning   . 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 Acute on chronic congestive heart failure ([place])   . Chronic combined systolic (congestive) and diastolic (congestive) heart failure ([place]) 01/16/2018  . Acute on chronic diastolic CHF (congestive heart failure) ([place]) 01/16/2018  . Atrial fibrillation ([place]) 10/21/2017  . CAD (coronary artery disease) of bypass graft 10/20/2017  . Coronary artery disease of bypass graft of native heart with stable angina pectoris ([place])   . Chronic low back pain 08/24/2016  . Pain of left thumb 08/05/2016  . Nocturnal hypoxia  06/02/2016  . Hoarseness 04/05/2016  . Laryngopharyngeal reflux (LPR) 04/05/2016  . Pharyngoesophageal dysphagia 04/05/2016  . Angina decubitus ([place]) 05/22/2015  . Chronic insomnia 05/06/2015  . Common migraine 05/14/2014  . Abnormality of gait 05/14/2014  . Memory change 05/14/2014  . Aneurysm, cerebral, nonruptured 05/14/2014  . Cramp of limb-Left neck 05/10/2014  . Hematemesis with  nausea   . Vomiting blood   . Dizziness and giddiness 09/21/2013  . Atypical chest pain 07/18/2013  . Unstable angina ([place]) 04/09/2013  . Carotid artery stenosis, symptomatic 03/20/2013  . Cerebrovascular disease 07/10/2012  . Cerebral artery occlusion with cerebral infarction ([place]) 07/10/2012  . Mitral regurgitation 04/15/2012  . TIA (transient ischemic attack) 04/15/2012  . Occlusion and stenosis of carotid artery without mention of cerebral infarction 08/18/2011  . Bariatric surgery status 06/17/2011  . Speech abnormality 03/22/2011  . Dyspnea 02/24/2011  . PAPILLARY MUSCLE DYSFUNCTION, NON-RHEUMATIC 10/09/2008  . UNSPECIFIED VITAMIN D DEFICIENCY 10/24/2007  . MYOCARDIAL INFARCTION, HX OF 10/24/2007  . PERSISTENT VOMITING 10/24/2007  . OSTEOARTHRITIS 10/24/2007  . MIGRAINES, HX OF 10/24/2007  . Diabetes mellitus type 2, controlled, with complications ([place]) 51/76/1607  . Hyperlipemia 01/16/2007  . Obesity-post failed open gastroplasty 1984  01/16/2007  . OBSTRUCTIVE SLEEP APNEA 01/16/2007  . Essential hypertension 01/16/2007  . Coronary atherosclerosis 01/16/2007  . Asthma 01/16/2007  . GERD 01/16/2007  . VENTRAL HERNIA 01/16/2007   PCP:  Derinda Late, MD Pharmacy:   [facility], [place] - [address] [place] [address] Phone: [phone] Fax: [phone]  [facility], [address] [place] [address] [place] [address] Phone: [phone] Fax: [phone]     Social Determinants of Health (SDOH) Interventions    Readmission Risk Interventions Readmission Risk Prevention Plan 05/17/2018  Transportation Screening Complete  PCP or Specialist Appt within 3-5 Days Complete  HRI or [place] Complete  Social Work Consult for [place] Planning/Counseling Complete  Palliative Care Screening Not Applicable  Medication Review Press photographer) Complete  Some recent data  might be hidden

## 2018-12-25 NOTE — Plan of Care (Signed)
Patient reports a 0 pain level on a pain scale of 0 to 10 this morning during assessment.

## 2018-12-26 DIAGNOSIS — R404 Transient alteration of awareness: Secondary | ICD-10-CM

## 2018-12-26 LAB — BASIC METABOLIC PANEL
Anion gap: 9 (ref 5–15)
BUN: 18 mg/dL (ref 8–23)
CO2: 26 mmol/L (ref 22–32)
Calcium: 9.1 mg/dL (ref 8.9–10.3)
Chloride: 105 mmol/L (ref 98–111)
Creatinine, Ser: 1.01 mg/dL — ABNORMAL HIGH (ref 0.44–1.00)
GFR calc Af Amer: >60 mL/min (ref >60)
GFR calc non Af Amer: 56 mL/min — ABNORMAL LOW (ref >60)
Glucose, Bld: 147 mg/dL — ABNORMAL HIGH (ref 70–99)
Potassium: 4.2 mmol/L (ref 3.5–5.1)
Sodium: 140 mmol/L (ref 135–145)

## 2018-12-26 LAB — GLUCOSE, CAPILLARY
Glucose-Capillary: 131 mg/dL — ABNORMAL HIGH (ref 70–99)
Glucose-Capillary: 168 mg/dL — ABNORMAL HIGH (ref 70–99)

## 2018-12-26 LAB — CBC
HCT: 31 % — ABNORMAL LOW (ref 36.0–46.0)
Hemoglobin: 9.3 g/dL — ABNORMAL LOW (ref 12.0–15.0)
MCH: 24.9 pg — ABNORMAL LOW (ref 26.0–34.0)
MCHC: 30 g/dL (ref 30.0–36.0)
MCV: 82.9 fL (ref 80.0–100.0)
Platelets: 167 10*3/uL (ref 150–400)
RBC: 3.74 MIL/uL — ABNORMAL LOW (ref 3.87–5.11)
RDW: 17.7 % — ABNORMAL HIGH (ref 11.5–15.5)
WBC: 11.4 10*3/uL — ABNORMAL HIGH (ref 4.0–10.5)
nRBC: 0 % (ref 0.0–0.2)

## 2018-12-26 MED ORDER — METOLAZONE 2.5 MG PO TABS: 2.5000 mg | ORAL_TABLET | Freq: Every day | ORAL | Status: DC | PRN

## 2018-12-26 MED ORDER — ZOLPIDEM TARTRATE 10 MG PO TABS: 5.0000 mg | ORAL_TABLET | Freq: Every day | ORAL | 0 refills | Status: DC

## 2018-12-26 NOTE — Progress Notes (Signed)
Subjective: No further spells.   Exam: Vitals:   12/26/18 0835 12/26/18 0901  BP:  140/66  Pulse:  (!) 58  Resp:  11  Temp:  97.6 F (36.4 C)  SpO2: 99% 98%   Gen: In bed, NAD Resp: non-labored breathing, no acute distress  Neuro: MS: awake and alert, interactive and appropriate.  No drift.   Pertinent Labs: No hypoglycemia.  UDS positive for benzodiazepines.   Impression: 73 year old female being evaluated for intermittent episodes of staring spells as well as visual hallucinations.  Given the frequency of the staring spells and the fact that they are continuing, I do think continuous EEG to further elucidate an etiology would be prudent.  Visual hallucinations are somewhat of a puzzle, I do wonder if she may be having a waxing/waning delirium related to medication use.  She is on multiple psychoactive medications including gabapentin, benzodiazepines, Wellbutrin, duloxetine.  At this point, I think there is a possibility of seizures, but she is already on an anti-epileptic as well as a seizure threshold lowerg medication(wellbutrin). With no further spells, I think that the first step would be stopping wellbutrin to see how she does.   If the spells, continue, may need to consider ambulatory EEG. I do not think that cerebrovascular disease is playing a role here currently, and with recent dopplers, no need to repeat.   Recommendations: 1) D/C wellbutrin.  2) no further recommendations at this time, please call with questions or concerns.   Roland Rack, MD [facility] [phone]  If 7pm- 7am, please page neurology on call as listed in [place].

## 2018-12-26 NOTE — Discharge Summary (Signed)
Physician Discharge Summary  [name] [name] PVV:[id number] DOB: 08/28/1945 DOA: 12/23/2018  PCP: Derinda Late, MD  Admit date: 12/23/2018 Discharge date: 12/26/2018  Admitted From: home Discharge disposition: home   Recommendations for Outpatient Follow-Up:   1. wellbutrin stopped 2. Home health 3. Outpatient neurology follow up-- ? Ambulatory EEG   Discharge Diagnosis:   Principal Problem:   TIA (transient ischemic attack) Active Problems:   Diabetes mellitus type 2, controlled, with complications ([place])   Hyperlipemia   Essential hypertension   Atrial fibrillation (HCC)   Chronic combined systolic (congestive) and diastolic (congestive) heart failure (HCC)   CKD (chronic kidney disease), stage III   Neurologic abnormality    Discharge Condition: Improved.  Diet recommendation: Low sodium, heart healthy.  Carbohydrate-modified  Wound care: None.  Code status: Full.   History of Present Illness:   [name] [name] is a 73 y.o. female with medical history significant of CVA; DM; PVD; OSA not on CPAP; obesity; dementia; HTN; HLD;  Chronic low back pain; CKD; home O2; and chronic combined CHF presenting with "TIAs".  The patient's daughter provides most of the history.  She reports that the patient normally has about 10 "ghost TIAs" per day, where she has a dead stare and zones out.  She also has intermittent TIAs about once a month, but these have been occurring faster and stronger than usual.  She has had about 9 episodes in the last 10 days.  She has had excessive slurred speech, difficulty finishing her sentences, audio and visual hallucinations, n/v, gasping respirations, difficulty with memory, L face and eye drooping, LE tingling, ataxia, and weakness with episodes.  Her left "carotid was distended" last Thursday.  She also had trouble seeing one day.     Hospital Course by Problem:   Neurologic event: TIA vs seizures -Patient with known severe  intracranial stenosis presenting with symptoms atypical for TIA -She has known carotid stenosis with h/o L stent placement in 2015 -She had recent carotid US on 10/12 with mild R ICA stenosis and no restenosis on the L; it seems unlikely that stenosis would have developed in such a short period of time, but neurology has recommended repeat carotid US -MRI/MRA: no acute infarct -Echo: Left ventricular ejection fraction, by visual estimation, is 45 to 50%. The left ventricle has mildly decreased function. There is no left ventricular hypertrophy. -Continue Plavix, Eliquis -Neurology consult appreciated--overnight EEG-- no episode of seizures seen-- d/c Wellbutrin, on neurontin-- outpatient follow up -short EEG normal -PT home health/RN  HTN -resume home meds  HLD -resume home meds   DM -Recent A1c shows reasonably good control -resume home regimen  parox Afib on Eliquis -resume toprol -Continue Eliquis  Chronic combined CHF -Appears to be compensated at this time -resume Demadex and Aldactone  Stage 3b CKD -Appears to be stable at this time -Repeat BMP in AM  CAD -No c/o angina pain at this time -Continue Ranexa  COPD -Continue home meds including prn O2, Duoneb, Advair, Pulmicort  obesity Body mass index is 41.81 kg/m.    Medical Consultants:    neurology  Discharge Exam:   Vitals:   12/26/18 0901 12/26/18 1209  BP: 140/66 (!) 132/56  Pulse: (!) 58 (!) 57  Resp: 11 19  Temp: 97.6 F (36.4 C) 98 F (36.7 C)  SpO2: 98% 98%   Vitals:   12/26/18 0314 12/26/18 0835 12/26/18 0901 12/26/18 1209  BP: (!) 136/52  140/66 (!) 132/56  Pulse: Marland Kitchen)  55  (!) 58 (!) 57  Resp: (!) 26  11 19   Temp: 97.8 F (36.6 C)  97.6 F (36.4 C) 98 F (36.7 C)  TempSrc: Oral  Oral Oral  SpO2: 96% 99% 98% 98%  Weight:      Height:        General exam: Appears calm and comfortable.   The results of significant diagnostics from this hospitalization (including  imaging, microbiology, ancillary and laboratory) are listed below for reference.     Procedures and Diagnostic Studies:   Mr Angio Head Wo Contrast  Result Date: 12/23/2018 CLINICAL DATA:  TIA EXAM: MRI HEAD WITHOUT CONTRAST MRA HEAD WITHOUT CONTRAST TECHNIQUE: Multiplanar, multiecho pulse sequences of the brain and surrounding structures were obtained without intravenous contrast. Angiographic images of the head were obtained using MRA technique without contrast. COMPARISON:  CT head 09/18/2018 FINDINGS: MRI HEAD FINDINGS Brain: Negative for acute infarct Chronic right posterior cerebral artery infarct involving the inferior right occipital lobe. Chronic ischemic change in the white matter. Scattered areas of chronic microhemorrhage in the left cerebral hemisphere. Vascular: Normal arterial flow voids. Skull and upper cervical spine: No focal bone lesion. Anterolisthesis C3-4 with spurring and spinal stenosis. Cord flattening. Sinuses/Orbits: Negative Other: None MRA HEAD FINDINGS Both vertebral arteries patent to the basilar. PICA patent bilaterally with scattered atherosclerotic disease. Mild to moderate stenosis in the proximal and mid basilar. Superior cerebellar and posterior cerebral arteries patent bilaterally. Multiple areas of moderate stenosis in the distal posterior cerebral artery on the right. Extensive atherosclerotic irregularity and moderate stenosis throughout the cavernous carotid bilaterally. Moderate stenosis in the A2 on the right. Mild stenosis left A2. Middle cerebral arteries patent without significant stenosis or occlusion. Negative for cerebral aneurysm. IMPRESSION: Negative for acute infarct Chronic right PCA infarct.  Chronic ischemia in the white matter Extensive intracranial atherosclerotic disease. Electronically Signed   By: Franchot Gallo M.D.   On: 12/23/2018 17:46   Mr Brain Wo Contrast  Result Date: 12/23/2018 CLINICAL DATA:  TIA EXAM: MRI HEAD WITHOUT CONTRAST MRA  HEAD WITHOUT CONTRAST TECHNIQUE: Multiplanar, multiecho pulse sequences of the brain and surrounding structures were obtained without intravenous contrast. Angiographic images of the head were obtained using MRA technique without contrast. COMPARISON:  CT head 09/18/2018 FINDINGS: MRI HEAD FINDINGS Brain: Negative for acute infarct Chronic right posterior cerebral artery infarct involving the inferior right occipital lobe. Chronic ischemic change in the white matter. Scattered areas of chronic microhemorrhage in the left cerebral hemisphere. Vascular: Normal arterial flow voids. Skull and upper cervical spine: No focal bone lesion. Anterolisthesis C3-4 with spurring and spinal stenosis. Cord flattening. Sinuses/Orbits: Negative Other: None MRA HEAD FINDINGS Both vertebral arteries patent to the basilar. PICA patent bilaterally with scattered atherosclerotic disease. Mild to moderate stenosis in the proximal and mid basilar. Superior cerebellar and posterior cerebral arteries patent bilaterally. Multiple areas of moderate stenosis in the distal posterior cerebral artery on the right. Extensive atherosclerotic irregularity and moderate stenosis throughout the cavernous carotid bilaterally. Moderate stenosis in the A2 on the right. Mild stenosis left A2. Middle cerebral arteries patent without significant stenosis or occlusion. Negative for cerebral aneurysm. IMPRESSION: Negative for acute infarct Chronic right PCA infarct.  Chronic ischemia in the white matter Extensive intracranial atherosclerotic disease. Electronically Signed   By: Franchot Gallo M.D.   On: 12/23/2018 17:46     Labs:   Basic Metabolic Panel: Recent Labs  Lab 12/23/18 1339 12/26/18 0316  NA 138 140  K 3.8 4.2  CL 98 105  CO2 31 26  GLUCOSE 207* 147*  BUN 19 18  CREATININE 1.47* 1.01*  CALCIUM 9.3 9.1   GFR Estimated Creatinine Clearance: 57.7 mL/min (A) (by C-G formula based on SCr of 1.01 mg/dL (H)). Liver Function Tests:  Recent Labs  Lab 12/23/18 1339  AST 23  ALT 17  ALKPHOS 84  BILITOT 0.5  PROT 6.7  ALBUMIN 3.5   No results for input(s): LIPASE, AMYLASE in the last 168 hours. No results for input(s): AMMONIA in the last 168 hours. Coagulation profile Recent Labs  Lab 12/23/18 1339  INR 1.6*    CBC: Recent Labs  Lab 12/23/18 1339 12/26/18 0316  WBC 9.2 11.4*  NEUTROABS 6.8  --   HGB 10.1* 9.3*  HCT 34.7* 31.0*  MCV 83.8 82.9  PLT 203 167   Cardiac Enzymes: No results for input(s): CKTOTAL, CKMB, CKMBINDEX, TROPONINI in the last 168 hours. BNP: Invalid input(s): POCBNP CBG: Recent Labs  Lab 12/25/18 1103 12/25/18 1706 12/25/18 2109 12/26/18 0624 12/26/18 1210  GLUCAP 175* 185* 195* 131* 168*   D-Dimer No results for input(s): DDIMER in the last 72 hours. Hgb A1c No results for input(s): HGBA1C in the last 72 hours. Lipid Profile Recent Labs    12/24/18 0336  CHOL 90  HDL 34*  LDLCALC 38  TRIG 90  CHOLHDL 2.6   Thyroid function studies No results for input(s): TSH, T4TOTAL, T3FREE, THYROIDAB in the last 72 hours.  Invalid input(s): FREET3 Anemia work up No results for input(s): VITAMINB12, FOLATE, FERRITIN, TIBC, IRON, RETICCTPCT in the last 72 hours. Microbiology Recent Results (from the past 240 hour(s))  SARS CORONAVIRUS 2 (TAT 6-24 HRS) Nasopharyngeal Nasopharyngeal Swab     Status: None   Collection Time: 12/23/18  9:21 PM   Specimen: Nasopharyngeal Swab  Result Value Ref Range Status   SARS Coronavirus 2 NEGATIVE NEGATIVE Final    Comment: (NOTE) SARS-CoV-2 target nucleic acids are NOT DETECTED. The SARS-CoV-2 RNA is generally detectable in upper and lower respiratory specimens during the acute phase of infection. Negative results do not preclude SARS-CoV-2 infection, do not rule out co-infections with other pathogens, and should not be used as the sole basis for treatment or other patient management decisions. Negative results must be combined  with clinical observations, patient history, and epidemiological information. The expected result is Negative. Fact Sheet for Patients: SugarRoll.be Fact Sheet for Healthcare Providers: https://www.woods-mathews.com/ This test is not yet approved or cleared by the [place] FDA and  has been authorized for detection and/or diagnosis of SARS-CoV-2 by FDA under an Emergency Use Authorization (EUA). This EUA will remain  in effect (meaning this test can be used) for the duration of the COVID-19 declaration under Section 56 4(b)(1) of the Act, 21 U.S.C. section 360bbb-3(b)(1), unless the authorization is terminated or revoked sooner. Performed at [facility], [place] [address] [place], [place] [address]      Discharge Instructions:   Discharge Instructions    Ambulatory referral to Neurology   Complete by: As directed    An appointment is requested in approximately: 1 week   Diet - low sodium heart healthy   Complete by: As directed    Diet Carb Modified   Complete by: As directed    Discharge instructions   Complete by: As directed    Home health   Increase activity slowly   Complete by: As directed      Allergies as of 12/26/2018  Reactions   Amoxicillin Shortness Of Breath, Rash   Brilinta [ticagrelor] Shortness Of Breath   Erythromycin Shortness Of Breath, Other (See Comments), Hives   Trouble swallowing   Flagyl [metronidazole] Shortness Of Breath, Palpitations   Penicillins Hives, Shortness Of Breath, Rash, Other (See Comments)   Has patient had a PCN reaction causing immediate rash, facial/tongue/throat swelling, SOB or lightheadedness with hypotension: Yes Has patient had a PCN reaction causing severe rash involving mucus membranes or skin necrosis: No Has patient had a PCN reaction that required hospitalization: Yes Has patient had a PCN reaction occurring within the last 10 years: No If all of the above  answers are "NO", then may proceed with Cephalosporin use.   Isosorbide Mononitrate [isosorbide Nitrate] Other (See Comments)   Joint aches, muscles hurt, difficult to walk   Jardiance [empagliflozin] Other (See Comments)   Nausea, joint aches, muscles aches   Metformin And Related Other (See Comments)   Stomach pain, cold sweats, joint pain, burred vision, dizziness   Tape Other (See Comments)   Skin pulls off with certain types Plastic tape causes skin to rip if left on for long periods of time   Erythromycin Base Rash      Medication List    STOP taking these medications   buPROPion 150 MG 24 hr tablet Commonly known as: WELLBUTRIN XL   dextromethorphan-guaiFENesin 30-600 MG 12hr tablet Commonly known as: MUCINEX DM     TAKE these medications   acetaminophen 500 MG tablet Commonly known as: TYLENOL Take 1 tablet (500 mg total) by mouth every 6 (six) hours as needed for moderate pain or fever. What changed:   how much to take  reasons to take this   albuterol 108 (90 Base) MCG/ACT inhaler Commonly known as: VENTOLIN HFA Inhale 2 puffs into the lungs every 4 (four) hours as needed for wheezing or shortness of breath.   ALPRAZolam 0.5 MG tablet Commonly known as: XANAX Take 1 tablet (0.5 mg total) by mouth 2 (two) times daily as needed for anxiety. What changed:   how much to take  when to take this  additional instructions   amLODipine 5 MG tablet Commonly known as: NORVASC Take 1 tablet (5 mg total) by mouth daily. What changed: when to take this   apixaban 5 MG Tabs tablet Commonly known as: ELIQUIS Take 1 tablet (5 mg total) by mouth 2 (two) times daily.   bisacodyl 5 MG EC tablet Commonly known as: Dulcolax Take 1 tablet (5 mg total) by mouth daily as needed for moderate constipation.   budesonide 0.25 MG/2ML nebulizer solution Commonly known as: PULMICORT Take 2 mLs (0.25 mg total) by nebulization 2 (two) times daily. What changed:   when to  take this  reasons to take this   clopidogrel 75 MG tablet Commonly known as: PLAVIX Take 1 tablet (75 mg total) by mouth at bedtime.   DULoxetine 30 MG capsule Commonly known as: CYMBALTA Take 1 capsule (30 mg total) by mouth daily.   ezetimibe 10 MG tablet Commonly known as: ZETIA Take 1 tablet (10 mg total) by mouth at bedtime.   Fluticasone-Salmeterol 250-50 MCG/DOSE Aepb Commonly known as: ADVAIR Inhale 1 puff into the lungs daily.   gabapentin 300 MG capsule Commonly known as: NEURONTIN Take 1 capsule (300 mg total) by mouth daily. What changed:   how much to take  when to take this  additional instructions   HumaLOG KwikPen 100 UNIT/ML KwikPen Generic drug: insulin lispro Inject 14-18  Units into the skin See admin instructions. Inject 14 units subcutaneously prior to breakfast and supper; add 4 units for CBG >200   HYDROcodone-acetaminophen 5-325 MG tablet Commonly known as: NORCO/VICODIN Take 1 tablet by mouth at bedtime as needed for moderate pain.   ipratropium-albuterol 0.5-2.5 (3) MG/3ML Soln Commonly known as: DUONEB Take 3 mLs by nebulization 2 (two) times daily. What changed:   when to take this  reasons to take this   Lantus SoloStar 100 UNIT/ML Solostar Pen Generic drug: Insulin Glargine Inject 38-44 Units into the skin See admin instructions. Inject 38 units subcutaneously daily at bedtime, increase to 44 units for CBG >200   metolazone 2.5 MG tablet Commonly known as: ZAROXOLYN Take 1 tablet (2.5 mg total) by mouth daily as needed (As directed by heart failure clinic. Call heart failure office for direction when experiencing overnight weight gain).   metoprolol succinate 50 MG 24 hr tablet Commonly known as: TOPROL-XL Take 1 tablet (50 mg total) by mouth daily. Take with or immediately following a meal.   multivitamin with minerals Tabs tablet Take 1 tablet by mouth every morning. Centrum - Women over 55   nitroGLYCERIN 0.3 MG SL  tablet Commonly known as: NITROSTAT Place 1 tablet (0.3 mg total) under the tongue every 5 (five) minutes x 3 doses as needed for chest pain.   Ocuvite Eye Health Formula Caps Take 1 capsule by mouth every morning.   ondansetron 4 MG disintegrating tablet Commonly known as: ZOFRAN-ODT Take 4 mg by mouth 2 (two) times daily as needed for nausea or vomiting.   OXYGEN Inhale 2 L into the lungs at bedtime as needed (shortness of breath).   pantoprazole 40 MG tablet Commonly known as: PROTONIX Take 1 tablet (40 mg total) by mouth daily.   potassium chloride SA 20 MEQ tablet Commonly known as: KLOR-CON TAKE ONE (1) TABLET BY MOUTH TWO (2) TIMES DAILY What changed: See the new instructions.   Praluent 150 MG/ML Soaj Generic drug: Alirocumab Inject 1 pen into the skin every 14 (fourteen) days. What changed: how much to take   Prolia 60 MG/ML Sosy injection Generic drug: denosumab Inject 60 mg into the skin every 6 (six) months.   ranolazine 500 MG 12 hr tablet Commonly known as: RANEXA Take 1 tablet (500 mg total) by mouth 2 (two) times daily.   REFRESH OP Place 1 drop into both eyes daily as needed (redness/ dry eyes).   rosuvastatin 40 MG tablet Commonly known as: CRESTOR Take 1 tablet (40 mg total) by mouth at bedtime.   spironolactone 25 MG tablet Commonly known as: ALDACTONE Take 1 tablet (25 mg total) by mouth daily.   torsemide 20 MG tablet Commonly known as: DEMADEX Take 40-80 mg by mouth See admin instructions. Take two tablets (40 mg) by mouth every morning, take four tablets (80 mg) for increased swelling of legs and arms - max 4 tablets daily What changed: Another medication with the same name was removed. Continue taking this medication, and follow the directions you see here.   triamcinolone cream 0.1 % Commonly known as: KENALOG Apply 1 application topically daily as needed (crusty spots on arms).   vitamin B-12 1000 MCG tablet Commonly known as:  CYANOCOBALAMIN Take 1,000 mcg by mouth every morning.   VITAMIN B-6 PO Take 1 tablet by mouth every morning.   zolpidem 10 MG tablet Commonly known as: AMBIEN Take 0.5 tablets (5 mg total) by mouth at bedtime. What changed: how much to  take         Time coordinating discharge: 35 min  Signed:  Geradine Girt DO  [facility] 12/26/2018, 12:16 PM

## 2018-12-26 NOTE — TOC Transition Note (Signed)
Transition of Care [facility]) - CM/SW Discharge Note   Patient Details  Name: [name] [name] [name]: [id number] Date of Birth: 04-13-1945  Transition of [facility]) CM/SW Contact:  Pollie Friar, RN Phone Number: 12/26/2018, 1:07 PM   Clinical Narrative:    Pt discharging home with [facility] services through [facility]. Pt has supervision at home and transportation home.    Final next level of care: Home w Home Health Services Barriers to Discharge: No Barriers Identified   Patient Goals and CMS Choice   CMS Medicare.gov Compare Post Acute Care list provided to:: Patient Choice offered to / list presented to : Patient  Discharge Placement                       Discharge Plan and Services   Discharge Planning Services: CM Consult Post Acute Care Choice: Home Health                    HH Arranged: PT, RN [facility] Agency: [facility] Date [place]: 12/25/18   Representative spoke with at [place]: [place] ([place]) Interventions     Readmission Risk Interventions Readmission Risk Prevention Plan 05/17/2018  Transportation Screening Complete  PCP or Specialist Appt within 3-5 Days Complete  HRI or [place] Complete  Social Work Consult for [place] Planning/Counseling Complete  Palliative Care Screening Not Applicable  Medication Review Press photographer) Complete  Some recent data might be hidden

## 2018-12-26 NOTE — Progress Notes (Signed)
Physical Therapy Treatment Patient Details Name: ISIS COSTANZA [name]: [id number] DOB: [birth year] Today's Date: 12/26/2018    History of Present Illness [name] [name] is a 73 y.o. female with a PMHx significant for DM2, PVD, CVA (2014, 2015, 2016), migraine, HTN, HLD, BCC (2016), CHF, L ankle ORIF (05/2018) and CAD who presented to the [facility] ED with c/o unsteady gait and slurred speech. Family and patient then described more complex events labeled by them as TIA's, stating that she had approximately 9 such TIAs in the past 10 days. The events have stereotyped features, but vary significantly in duration and are associated with complex behaviors. MRI negative for acute infarct; showing chronic right PCA infarct.    PT Comments    Pt continues to progress slowly and limited due to [facility] with exertion.  She recovers well with seated rest periods.  Husband at bedside and patient is eager to return home.  Pt perform progression to stair training with good tolerance.  HHPT remains appropriate at d/c   Pt has all necessary equipment.     Follow Up Recommendations  Home health PT     Equipment Recommendations  None recommended by PT    Recommendations for Other Services       Precautions / Restrictions Precautions Precautions: Fall Restrictions Weight Bearing Restrictions: No    Mobility  Bed Mobility               General bed mobility comments: in chair on arrival.  Transfers Overall transfer level: Needs assistance Equipment used: Rolling walker (2 wheeled) Transfers: Sit to/from Stand Sit to Stand: Supervision         General transfer comment: for safety  Ambulation/Gait Ambulation/Gait assistance: Supervision;Min guard Gait Distance (Feet): 85 Feet(x2) Assistive device: Rolling walker (2 wheeled) Gait Pattern/deviations: Step-through pattern;Decreased stride length;Decreased step length - left Gait velocity: decreased   General Gait Details: Slow and steady pace with  bilateral foot external rotation (likely baseline); slightly flexed trunk posture, seated rest break between trials.   Stairs Stairs: Yes           Wheelchair Mobility    Modified Rankin (Stroke Patients Only) Modified Rankin (Stroke Patients Only) Pre-Morbid Rankin Score: Moderate disability Modified Rankin: Moderately severe disability     Balance Overall balance assessment: Needs assistance Sitting-balance support: Feet supported Sitting balance-Leahy Scale: Good     Standing balance support: No upper extremity supported;During functional activity Standing balance-Leahy Scale: Fair                              Cognition Arousal/Alertness: Awake/alert Behavior During Therapy: WFL for tasks assessed/performed Overall Cognitive Status: Within Functional Limits for tasks assessed                                 General Comments: following all commands      Exercises      General Comments        Pertinent Vitals/Pain Pain Assessment: No/denies pain Faces Pain Scale: Hurts little more Pain Location: R flank from fall out of bed Pain Descriptors / Indicators: Grimacing;Guarding Pain Intervention(s): Monitored during session;Repositioned    Home Living                      Prior Function            PT Goals (current goals  can now be found in the care plan section) Acute Rehab PT Goals Patient Stated Goal: "walk longer distances." Potential to Achieve Goals: Good Progress towards PT goals: Progressing toward goals    Frequency    Min 3X/week      PT Plan Current plan remains appropriate    Co-evaluation              AM-PAC PT "6 Clicks" Mobility   Outcome Measure  Help needed turning from your back to your side while in a flat bed without using bedrails?: None Help needed moving from lying on your back to sitting on the side of a flat bed without using bedrails?: None Help needed moving to and from a  bed to a chair (including a wheelchair)?: None Help needed standing up from a chair using your arms (e.g., wheelchair or bedside chair)?: None Help needed to walk in hospital room?: A Little Help needed climbing 3-5 steps with a railing? : A Little 6 Click Score: 22    End of Session Equipment Utilized During Treatment: Gait belt Activity Tolerance: Patient tolerated treatment well Patient left: in chair;with call bell/phone within reach;with chair alarm set Nurse Communication: Mobility status PT Visit Diagnosis: Unsteadiness on feet (R26.81);History of falling (Z91.81);Difficulty in walking, not elsewhere classified (R26.2)     Time: 4970-2637 PT Time Calculation (min) (ACUTE ONLY): 19 min  Charges:  $Gait Training: 8-22 mins                     Erasmo Leventhal , PTA Acute Rehabilitation Services Pager [phone] Office [phone]     Madalena Kesecker Eli Hose 12/26/2018, 1:55 PM

## 2018-12-26 NOTE — Procedures (Signed)
Patient Name: [name] [name]  [name]: [id number]  Epilepsy Attending: Lora Havens  Referring Physician/Provider: Dr Roland Rack Duration: 12/25/2018 1236 to 12/26/2018 1020  Patient history: [name] [name] is an 73 y.o. female with staring spells. EEG to evaluate for seizures  Level of alertness: awake, asleep  AEDs during EEG study: None  Technical aspects: This EEG study was done with scalp electrodes positioned according to the 10-20 International system of electrode placement. Electrical activity was acquired at a sampling rate of 500Hz  and reviewed with a high frequency filter of 70Hz  and a low frequency filter of 1Hz . EEG data were recorded continuously and digitally stored.   DESCRIPTION: During awake state, the posterior dominant rhythm consists of 8-9 Hz activity of moderate voltage (25-35 uV) seen predominantly in posterior head regions, symmetric and reactive to eye opening and eye closing. Sleep was characterized by vertex waves, sleep spindles (12-14Hz ), maximum frontocentral region. Physiologic photic driving was seen during photic stimulation. No EEG change was seen during hyperventilation.    IMPRESSION: This study is within normal limits. No seizures or epileptiform discharges were seen throughout the recording.  Zakyah Yanes Barbra Sarks

## 2018-12-26 NOTE — Progress Notes (Signed)
LTM discontinued; did photic and HV prior to disconnect; no skin breakdown was seen.

## 2018-12-26 NOTE — Plan of Care (Signed)
Patient calm and cooperative with care. No signs of increased anxiety noted at this time.

## 2019-01-04 ENCOUNTER — Ambulatory Visit: Payer: Medicare Other | Admitting: Neurology

## 2019-01-08 ENCOUNTER — Other Ambulatory Visit: Payer: Self-pay

## 2019-01-08 ENCOUNTER — Ambulatory Visit (INDEPENDENT_AMBULATORY_CARE_PROVIDER_SITE_OTHER): Payer: Medicare Other | Admitting: Pulmonary Disease

## 2019-01-08 ENCOUNTER — Encounter: Payer: Self-pay | Admitting: Pulmonary Disease

## 2019-01-08 DIAGNOSIS — J4530 Mild persistent asthma, uncomplicated: Secondary | ICD-10-CM

## 2019-01-08 DIAGNOSIS — I6523 Occlusion and stenosis of bilateral carotid arteries: Secondary | ICD-10-CM

## 2019-01-08 DIAGNOSIS — G4734 Idiopathic sleep related nonobstructive alveolar hypoventilation: Secondary | ICD-10-CM

## 2019-01-08 NOTE — Assessment & Plan Note (Signed)
Continue Advair Albuterol as needed only Seems to be well controlled Dyspnea issues in the past seem to be related to fluid overload from heart failure

## 2019-01-08 NOTE — Assessment & Plan Note (Addendum)
Continue 2 L of oxygen during sleep Flu shot when she goes in for her cardiology appointment

## 2019-01-08 NOTE — Progress Notes (Signed)
I connected with  [name] [name] on 01/08/19 by phon and verified that I am speaking with the correct person using two identifiers.   I discussed the limitations of evaluation and management by telemedicine. The patient expressed understanding and agreed to proceed.   73 yo never smoker followed for asthma and nocturnal hypoxemia on oxygen  Past medical history significant for ischemic cardiomyopathy and chronic diastolic heart failure,CVA CAD s/p CABG , IDDM   Last office visit 03/2018-20 pound weight gain, asked to increase diuretics She had an eventful year -chronological events as per CHF note 'She was admitted 01/2018 for cardiomems implant and RHC. She was diuresed with IV lasix and transitioned to torsemide 100 mg BID + metolazone once/week.   She had a prolonged hospitalization in 03/2018 with acute respiratory failure and fever to 103. She was thought to have PNA and treated with cefepime. She was intubated. Due to concern for amiodarone pulmonary toxicity with ESR 113. Amiodarone was stopped and she was put on prednisone. Echo in 2/20 showed EF down to 25-30% with mid-apical LV severe hypokinesis. She had AKI. Possible ITP triggered by infection, HIT negative. ITP was treated with Solumedrol. She was discharged to SNF.  She was sent home from SNF but fractured her ankle when she fell walking from car to house (mechanical). S/p ORIF left ankle 05/16/18.  She had  a prolonged hospitalization with AMS and low grade temp 09/2018. CT of head negative. Later transferred to CIR. Unfortunately she was readmitted to acute due to hypotension and bradycardia. Briefly placed on dopamine and HTN meds stopped. Once stabilized she returned to CIR. HTN meds gradually restarted.' '  She has since been home but was hospitalized 12/2018 for TIA . She  ambulates with a walker and has slurred speech Through all this her breathing has been stable.  She is back on Advair twice daily, only needs nebs as  needed and has not taken Pulmicort She denies wheezing or nocturnal symptoms   Significant tests/ events reviewed  NPSG 2006: AHI 19/hr >> CPAP intolerant PSG 05/28/16 no OSA>> on O2 2 L  Echo 12/2018 EF 45% , septal and lateral hypokinesis, moderate MR, RVSP 51 Right heart cath 05/2015 PCWP 9  Spirometry 06/2016 moderate restriction, ratio 83, FEV1 53%, FVC49%    Exam unable   Total encounter time x 3 minutes

## 2019-01-09 ENCOUNTER — Encounter (HOSPITAL_COMMUNITY): Payer: Medicare Other

## 2019-01-11 ENCOUNTER — Encounter (HOSPITAL_COMMUNITY): Payer: Self-pay | Admitting: Cardiology

## 2019-01-11 ENCOUNTER — Other Ambulatory Visit: Payer: Self-pay

## 2019-01-11 ENCOUNTER — Other Ambulatory Visit (HOSPITAL_COMMUNITY): Payer: Medicare Other

## 2019-01-11 ENCOUNTER — Ambulatory Visit (HOSPITAL_COMMUNITY)
Admission: RE | Admit: 2019-01-11 | Discharge: 2019-01-11 | Disposition: A | Discharge status: Home / Self Care | Payer: Medicare Other | Source: Ambulatory Visit | Attending: Cardiology | Admitting: Cardiology

## 2019-01-11 VITALS — BP 153/69 | HR 62 | Wt 225.6 lb

## 2019-01-11 DIAGNOSIS — G4733 Obstructive sleep apnea (adult) (pediatric): Secondary | ICD-10-CM | POA: Diagnosis not present

## 2019-01-11 DIAGNOSIS — Z6841 Body Mass Index (BMI) 40.0 and over, adult: Secondary | ICD-10-CM | POA: Insufficient documentation

## 2019-01-11 DIAGNOSIS — E785 Hyperlipidemia, unspecified: Secondary | ICD-10-CM | POA: Diagnosis not present

## 2019-01-11 DIAGNOSIS — I447 Left bundle-branch block, unspecified: Secondary | ICD-10-CM | POA: Diagnosis not present

## 2019-01-11 DIAGNOSIS — Z9981 Dependence on supplemental oxygen: Secondary | ICD-10-CM | POA: Diagnosis not present

## 2019-01-11 DIAGNOSIS — N183 Chronic kidney disease, stage 3 unspecified: Secondary | ICD-10-CM | POA: Diagnosis not present

## 2019-01-11 DIAGNOSIS — I25119 Atherosclerotic heart disease of native coronary artery with unspecified angina pectoris: Secondary | ICD-10-CM

## 2019-01-11 DIAGNOSIS — Z888 Allergy status to other drugs, medicaments and biological substances status: Secondary | ICD-10-CM | POA: Insufficient documentation

## 2019-01-11 DIAGNOSIS — I2510 Atherosclerotic heart disease of native coronary artery without angina pectoris: Secondary | ICD-10-CM | POA: Insufficient documentation

## 2019-01-11 DIAGNOSIS — Z794 Long term (current) use of insulin: Secondary | ICD-10-CM | POA: Insufficient documentation

## 2019-01-11 DIAGNOSIS — F419 Anxiety disorder, unspecified: Secondary | ICD-10-CM | POA: Insufficient documentation

## 2019-01-11 DIAGNOSIS — Z7901 Long term (current) use of anticoagulants: Secondary | ICD-10-CM | POA: Diagnosis not present

## 2019-01-11 DIAGNOSIS — Z7951 Long term (current) use of inhaled steroids: Secondary | ICD-10-CM | POA: Insufficient documentation

## 2019-01-11 DIAGNOSIS — Z955 Presence of coronary angioplasty implant and graft: Secondary | ICD-10-CM | POA: Insufficient documentation

## 2019-01-11 DIAGNOSIS — Z79899 Other long term (current) drug therapy: Secondary | ICD-10-CM | POA: Insufficient documentation

## 2019-01-11 DIAGNOSIS — I480 Paroxysmal atrial fibrillation: Secondary | ICD-10-CM | POA: Diagnosis not present

## 2019-01-11 DIAGNOSIS — Z880 Allergy status to penicillin: Secondary | ICD-10-CM | POA: Insufficient documentation

## 2019-01-11 DIAGNOSIS — N189 Chronic kidney disease, unspecified: Secondary | ICD-10-CM | POA: Diagnosis not present

## 2019-01-11 DIAGNOSIS — Z7902 Long term (current) use of antithrombotics/antiplatelets: Secondary | ICD-10-CM | POA: Diagnosis not present

## 2019-01-11 DIAGNOSIS — Z951 Presence of aortocoronary bypass graft: Secondary | ICD-10-CM | POA: Diagnosis not present

## 2019-01-11 DIAGNOSIS — I130 Hypertensive heart and chronic kidney disease with heart failure and stage 1 through stage 4 chronic kidney disease, or unspecified chronic kidney disease: Secondary | ICD-10-CM | POA: Diagnosis not present

## 2019-01-11 DIAGNOSIS — Z8249 Family history of ischemic heart disease and other diseases of the circulatory system: Secondary | ICD-10-CM | POA: Insufficient documentation

## 2019-01-11 DIAGNOSIS — I5042 Chronic combined systolic (congestive) and diastolic (congestive) heart failure: Secondary | ICD-10-CM | POA: Insufficient documentation

## 2019-01-11 DIAGNOSIS — Z8673 Personal history of transient ischemic attack (TIA), and cerebral infarction without residual deficits: Secondary | ICD-10-CM | POA: Diagnosis not present

## 2019-01-11 DIAGNOSIS — E1122 Type 2 diabetes mellitus with diabetic chronic kidney disease: Secondary | ICD-10-CM | POA: Insufficient documentation

## 2019-01-11 DIAGNOSIS — K219 Gastro-esophageal reflux disease without esophagitis: Secondary | ICD-10-CM | POA: Diagnosis not present

## 2019-01-11 DIAGNOSIS — Z85820 Personal history of malignant melanoma of skin: Secondary | ICD-10-CM | POA: Diagnosis not present

## 2019-01-11 DIAGNOSIS — Z881 Allergy status to other antibiotic agents status: Secondary | ICD-10-CM | POA: Insufficient documentation

## 2019-01-11 LAB — BASIC METABOLIC PANEL
Anion gap: 15 (ref 5–15)
BUN: 19 mg/dL (ref 8–23)
CO2: 30 mmol/L (ref 22–32)
Calcium: 9.6 mg/dL (ref 8.9–10.3)
Chloride: 95 mmol/L — ABNORMAL LOW (ref 98–111)
Creatinine, Ser: 1.46 mg/dL — ABNORMAL HIGH (ref 0.44–1.00)
GFR calc Af Amer: 42 mL/min — ABNORMAL LOW (ref >60)
GFR calc non Af Amer: 36 mL/min — ABNORMAL LOW (ref >60)
Glucose, Bld: 125 mg/dL — ABNORMAL HIGH (ref 70–99)
Potassium: 3.5 mmol/L (ref 3.5–5.1)
Sodium: 140 mmol/L (ref 135–145)

## 2019-01-11 LAB — BRAIN NATRIURETIC PEPTIDE: B Natriuretic Peptide: 473.7 pg/mL — ABNORMAL HIGH (ref 0.0–100.0)

## 2019-01-11 MED ORDER — TORSEMIDE 20 MG PO TABS: ORAL_TABLET | ORAL | 2 refills | Status: DC

## 2019-01-11 MED ORDER — POTASSIUM CHLORIDE CRYS ER 20 MEQ PO TBCR: EXTENDED_RELEASE_TABLET | ORAL | 2 refills | Status: DC

## 2019-01-11 MED ORDER — LOSARTAN POTASSIUM 25 MG PO TABS: 12.5000 mg | ORAL_TABLET | Freq: Every day | ORAL | 2 refills | Status: DC

## 2019-01-11 NOTE — Patient Instructions (Signed)
Labs done today. We will contact you only if your labs are abnormal.  DISCONTINUE Plavix  INCREASE Torsemide 80mg (4 tabs) by mouth every morning and 40mg (2 tabs) every evening.  INCREASE Potassium 40mg (2 tabs) by mouth every morning and 20mg (1 tab) by mouth every evening.   START Losartan 12.5mg (1/2 tab) by mouth daily  Your physician recommends that you schedule a follow-up appointment in: 10 days for a lab only appointment and in 3 weeks Dr. [name]   Please remember to send in cardiomems readings to the office  Your physician has requested that you have a TEE. During a TEE, sound waves are used to create images of your heart. It provides your doctor with information about the size and shape of your heart and how well your heart's chambers and valves are working. In this test, a transducer is attached to the end of a flexible tube that's guided down your throat and into your esophagus (the tube leading from you mouth to your stomach) to get a more detailed image of your heart. You are not awake for the procedure. Please see the instruction sheet given to you today. For further information please visit [email or web address].  At the [facility], you and your health needs are our priority. As part of our continuing mission to provide you with exceptional heart care, we have created designated Provider Care Teams. These Care Teams include your primary Cardiologist (physician) and Advanced Practice Providers (APPs- Physician Assistants and Nurse Practitioners) who all work together to provide you with the care you need, when you need it.   You may see any of the following providers on your designated Care Team at your next follow up: Marland Kitchen Dr Glori Bickers . Dr Loralie Champagne . Darrick Grinder, NP . Lyda Jester, PA . Audry Riles, PharmD   Please be sure to bring in all your medications bottles to every appointment.           PATIENT INSTRUCTIONS         TRANSESOPHAGEAL  ECHO (TEE)     [facility]- MAIN ENTRANCE "A"     [place]     Patient Name: [name] [name]                         Diagnosis:    ICD-10-CM   1. Chronic combined systolic (congestive) and diastolic (congestive) heart failure (HCC)  B16.60 Basic Metabolic Panel (BMET)    B Nat Peptide    Basic Metabolic Panel (BMET)       DATE AND TIME OF PROCEDURE: 01/18/2019 12:30pm     YOU ARE REQUIRED TO HAVE A PRE PROCEDURE COVID-19 TEST ON 01/15/2019 AT 11:50AM. YOU MUST SELF QUARANTINE UNTIL YOUR PROCEDURE ON THE 17TH.    Please register at the Admitting Department at 11am    .   DO NOT EAT OR DRINK ANYTHING after midnight prior to your procedure.     You may take your medications with a sip of water. PLEASE HOLD TORSEMIDE AND SPIRONOLACTONE THE MORNING OF YOUR TEE.    You WILL need someone with you to drive you home after the procedure.   If you have any questions, please call our office at [phone].           Transesophageal Echocardiogram Transesophageal echocardiogram (TEE) is a test that uses sound waves to take pictures of your heart. TEE is  done by passing a flexible tube down the esophagus. The esophagus is the tube that carries food from the throat to the stomach. The pictures give detailed images of your heart. This can help your doctor see if there are problems with your heart. What happens before the procedure? Staying hydrated Follow instructions from your doctor about hydration, which may include:  Up to 3 hours before the procedure - you may continue to drink clear liquids, such as: ? Water. ? Clear fruit juice. ? Black coffee. ? Plain tea.  Eating and drinking Follow instructions from your doctor about eating and drinking, which may include:  8 hours before the procedure - stop eating heavy meals or foods such as meat, fried foods, or fatty foods.  6 hours before the procedure - stop eating light meals or  foods, such as toast or cereal.  6 hours before the procedure - stop drinking milk or drinks that contain milk.  3 hours before the procedure - stop drinking clear liquids. General instructions  You will need to take out any dentures or retainers.  Plan to have someone take you home from the hospital or clinic.  If you will be going home right after the procedure, plan to have someone with you for 24 hours.  Ask your doctor about: ? Changing or stopping your normal medicines. This is important if you take diabetes medicines or blood thinners. ? Taking over-the-counter medicines, vitamins, herbs, and supplements. ? Taking medicines such as aspirin and ibuprofen. These medicines can thin your blood. Do not take these medicines unless your doctor tells you to take them. What happens during the procedure?  To lower your risk of infection, your doctors will wash or clean their hands.  An IV will be put into one of your veins.  You will be given a medicine to help you relax (sedative).  A medicine may be sprayed or gargled. This numbs the back of your throat.  Your blood pressure, heart rate, and breathing will be watched.  You may be asked to lay on your left side.  A bite block will be placed in your mouth. This keeps you from biting the tube.  The tip of the TEE probe will be placed into the back of your mouth.  You will be asked to swallow.  Your doctor will take pictures of your heart.  The probe and bite block will be taken out. The procedure may vary among doctors and hospitals. What happens after the procedure?   Your blood pressure, heart rate, breathing rate, and blood oxygen level will be watched until the medicines you were given have worn off.  When you first wake up, your throat may feel sore and numb. This will get better over time. You will not be allowed to eat or drink until the numbness has gone away.  Do not drive for 24 hours if you were given a medicine  to help you relax. Summary  TEE is a test that uses sound waves to take pictures of your heart.  You will be given a medicine to help you relax.  Do not drive for 24 hours if you were given a medicine to help you relax. This information is not intended to replace advice given to you by your health care provider. Make sure you discuss any questions you have with your health care provider. Document Released: 11/15/2008 Document Revised: 10/07/2017 Document Reviewed: 04/21/2016 Elsevier Patient Education  2020 [employer].

## 2019-01-12 ENCOUNTER — Ambulatory Visit (INDEPENDENT_AMBULATORY_CARE_PROVIDER_SITE_OTHER): Payer: Medicare Other | Admitting: Neurology

## 2019-01-12 ENCOUNTER — Encounter: Payer: Self-pay | Admitting: Neurology

## 2019-01-12 ENCOUNTER — Other Ambulatory Visit (HOSPITAL_COMMUNITY): Payer: Self-pay

## 2019-01-12 VITALS — BP 124/58 | HR 58 | Temp 98.0°F | Ht 62.0 in | Wt 227.4 lb

## 2019-01-12 DIAGNOSIS — R269 Unspecified abnormalities of gait and mobility: Secondary | ICD-10-CM

## 2019-01-12 DIAGNOSIS — R4789 Other speech disturbances: Secondary | ICD-10-CM

## 2019-01-12 DIAGNOSIS — I6523 Occlusion and stenosis of bilateral carotid arteries: Secondary | ICD-10-CM | POA: Diagnosis not present

## 2019-01-12 DIAGNOSIS — F5104 Psychophysiologic insomnia: Secondary | ICD-10-CM

## 2019-01-12 DIAGNOSIS — I679 Cerebrovascular disease, unspecified: Secondary | ICD-10-CM

## 2019-01-12 DIAGNOSIS — E1142 Type 2 diabetes mellitus with diabetic polyneuropathy: Secondary | ICD-10-CM

## 2019-01-12 DIAGNOSIS — I340 Nonrheumatic mitral (valve) insufficiency: Secondary | ICD-10-CM

## 2019-01-12 HISTORY — DX: Type 2 diabetes mellitus with diabetic polyneuropathy: E11.42

## 2019-01-12 MED ORDER — TRAZODONE HCL 50 MG PO TABS: 50.0000 mg | ORAL_TABLET | Freq: Every day | ORAL | 1 refills | Status: DC

## 2019-01-12 NOTE — Patient Instructions (Signed)
Try Trazodone 50 mg at night for sleep.

## 2019-01-12 NOTE — Progress Notes (Signed)
Reason for visit: Episodes of slurred speech, zoning out, hallucinations  Referring physician: [facility]  [name] [name] is a 73 y.o. female  History of present illness:  [name] [name] is a 73 year old right-handed white female with a history of multiple medical issues.  The patient has diabetes, atrial fibrillation, obesity, and a significant peripheral neuropathy associated with a gait disorder and multiple falls.  She reports some chronic issues with memory.  The patient has been in the hospital on several occasions, she was admitted on 09 September 2018 with problems with slurred speech and dizziness.  The patient was hypotensive requiring dobutamine.  The patient required rehab following this.  She has had frequent episodes since the summer 2020 of problems with staring off, slurring her speech, and at times some left-sided numbness and left facial droop.  The patient has had multiple cerebrovascular work-ups, the last such evaluation occurred in the hospital on 23 December 2018.  MRI of the brain on several occasions has been without acute changes, the patient is undergone MRA of the head, this shows extensive atherosclerosis.  Carotid Doppler studies and a 2D echocardiogram has been done.  The patient had an EEG study and an overnight EEG evaluation that were both normal.  The patient is on Plavix and Eliquis.  She is being evaluated for severe mitral valve calcification and valve leaflet thickening.  The patient reports chronic insomnia, she sleeps on average about 4 hours a night, she takes Ambien and Xanax at night and then takes Xanax in the morning.  The patient will nap on and off during the day.  At night she uses a oxygen concentrator, she does not use oxygen during the day.  She may nap during the day without oxygen.  The family has noted that normally her oxygen saturation is between 91-95, during episodes of confusion her oxygen saturations may go down to 88-85.  The patient reports that  she now uses a walker for ambulation, she will still fall on occasion.  She notes significant shortness of breath and fatigue with short distances, she can only walk about 20 feet.  She may get dizzy with standing.  She denies any blackouts.  She reports some intermittent numbness of the left arm and left leg and face.  She denies any new visual field changes or headache.  She complains of ongoing problems with memory.  Since coming out of the hospital in November, she has begun having problems with stuttering which was not there previously.  She is sent to this office for an evaluation.  Past Medical History:  Diagnosis Date  . Anemia    hx  . Anxiety   . Asthma   . Basal cell carcinoma 05/2014   "left shoulder"  . Bundle branch block, left    chronic/notes 07/18/2013  . CHF (congestive heart failure) ([place])   . Chronic insomnia 05/06/2015  . Chronic kidney disease    frequency, sees dr Jamal Maes every 4 to 6 months (01/16/2018)  . Chronic lower back pain   . Claustrophobia   . Common migraine 05/14/2014  . Coronary artery disease    MI in 2001, 2002, 2006, 2011, 2014  . Depression   . GERD (gastroesophageal reflux disease)   . H/O hiatal hernia   . Headache    "at least 2/month" (01/16/2018)  . Heart murmur   . Hyperlipidemia   . Hypertension   . Memory change 05/14/2014  . Migraine    "5-6/year"  (  01/16/2018)  . Obesity 01-2010  . Obstructive sleep apnea    "can't wear machine; I have claustrophobia" (01/16/2018), states she had a 2nd sleep study and does not have sleep apnea, her O2 decreases and now is on 2 L of O2 at night.  . On home oxygen therapy    "2L at night and prn during daytime" (01/16/2018)  . Osteoarthritis    "knees and hands" (01/16/2018)  . Peripheral vascular disease (HCC)    ? numbness, tingling arms and legs  . PONV (postoperative nausea and vomiting)   . Stroke [facility])    2014, 2015, 2016  . Type II diabetes mellitus ([place])   . Ventral hernia    hx  of    Past Surgical History:  Procedure Laterality Date  . ANKLE FRACTURE SURGERY Left 1970's  . APPENDECTOMY  1970's   w/hysterectomy  . BASAL CELL CARCINOMA EXCISION Left 05/2014   "shoulder" (01/16/2018)  . CARDIAC CATHETERIZATION  10/10/2012   Dr [name].  Marland Kitchen CARDIAC CATHETERIZATION N/A 05/29/2015   Procedure: Right/Left Heart Cath and Coronary/Graft Angiography;  Surgeon: Larey Dresser, MD;  Location: [place] [facility];  Service: Cardiovascular;  Laterality: N/A;  . CAROTID ENDARTERECTOMY Left 03/2013  . CAROTID STENT INSERTION Left 03/20/2013   Procedure: CAROTID STENT INSERTION;  Surgeon: [name], MD;  Location: [facility] CATH LAB;  Service: Cardiovascular;  Laterality: Left;  internal carotid  . CEREBRAL ANGIOGRAM N/A 04/05/2011   Procedure: CEREBRAL ANGIOGRAM;  Surgeon: Angelia Mould, MD;  Location: [facility] CATH LAB;  Service: Cardiovascular;  Laterality: N/A;  . CHOLECYSTECTOMY OPEN  2004  . CORONARY ANGIOPLASTY WITH STENT PLACEMENT  01,02,05,06,07,08,11; 04/24/2013   "I've probably got ~ 10 stents by now" (04/24/2013)  . CORONARY ANGIOPLASTY WITH STENT PLACEMENT  06/13/2013   "got 4 stents today" (06/13/2013)  . CORONARY ARTERY BYPASS GRAFT  1220/11   "CABG X5"  . CORONARY STENT INTERVENTION N/A 10/20/2017   Procedure: CORONARY STENT INTERVENTION;  Surgeon: Troy Sine, MD;  Location: [place] [facility];  Service: Cardiovascular;  Laterality: N/A;  . ESOPHAGOGASTRODUODENOSCOPY  08/03/2011   Procedure: ESOPHAGOGASTRODUODENOSCOPY (EGD);  Surgeon: Shann Medal, MD;  Location: [facility];  Service: General;  Laterality: N/A;  . ESOPHAGOGASTRODUODENOSCOPY (EGD) WITH PROPOFOL N/A 03/11/2014   Procedure: ESOPHAGOGASTRODUODENOSCOPY (EGD) WITH PROPOFOL;  Surgeon: Lafayette Dragon, MD;  Location: [facility];  Service: Endoscopy;  Laterality: N/A;  . FRACTURE SURGERY    . gall stone removal  05/2003  . GASTRIC RESTRICTION SURGERY  1984   "stapeling"  . HERNIA REPAIR  2004   "in my  stomach; had OR on it twice", wire mesh on 1 hernia  . LEFT HEART CATH AND CORS/GRAFTS ANGIOGRAPHY N/A 07/09/2016   Procedure: LEFT HEART CATH AND CORS/GRAFTS ANGIOGRAPHY;  Surgeon: Larey Dresser, MD;  Location: [place] [facility];  Service: Cardiovascular;  Laterality: N/A;  . ORIF ANKLE FRACTURE Left 05/16/2018  . ORIF ANKLE FRACTURE Left 05/16/2018   Procedure: OPEN REDUCTION INTERNAL FIXATION (ORIF) Left ankle with possible syndesmosis fixation;  Surgeon: Nicholes Stairs, MD;  Location: [place];  Service: Orthopedics;  Laterality: Left;  124min  . OVARY SURGERY  1970's   "tumor removed"  . PERCUTANEOUS CORONARY STENT INTERVENTION (PCI-S) N/A 06/13/2013   Procedure: PERCUTANEOUS CORONARY STENT INTERVENTION (PCI-S);  Surgeon: [name], MD;  Location: [facility] CATH LAB;  Service: Cardiovascular;  Laterality: N/A;  . PERCUTANEOUS STENT INTERVENTION N/A 04/24/2013   Procedure: PERCUTANEOUS STENT INTERVENTION;  Surgeon: [name], MD;  Location: [facility] CATH LAB;  Service: Cardiovascular;  Laterality: N/A;  . RIGHT/LEFT HEART CATH AND CORONARY/GRAFT ANGIOGRAPHY N/A 10/20/2017   Procedure: RIGHT/LEFT HEART CATH AND CORONARY/GRAFT ANGIOGRAPHY;  Surgeon: Larey Dresser, MD;  Location: [place] [facility];  Service: Cardiovascular;  Laterality: N/A;  . ROOT CANAL  10/2000  . TIBIA FRACTURE SURGERY Right 1970's   rods and pins  . TOOTH EXTRACTION     "1 on the upper; wisdom tooth on the lower" (01/16/2018)  . TOTAL ABDOMINAL HYSTERECTOMY  1970's   w/ appendectomy    Family History  Problem Relation Age of Onset  . Heart disease Father        Heart Disease before age 68  . Diabetes Father   . Hyperlipidemia Father   . Hypertension Father   . Heart attack Father   . Deep vein thrombosis Father   . AAA (abdominal aortic aneurysm) Father   . Hypertension Mother   . Deep vein thrombosis Mother   . AAA (abdominal aortic aneurysm) Mother   . Cancer Sister        Ovarian  .  Hypertension Sister   . Diabetes Paternal Grandmother   . Heart disease Paternal Uncle   . Diabetes Paternal Uncle   . Heart attack Paternal Grandfather 73       died of MI at 36  . Diabetes Paternal Aunt   . Diabetes Paternal Uncle   . Colon cancer Neg Hx     Social history:  reports that she has never smoked. She has never used smokeless tobacco. She reports that she does not drink alcohol or use drugs.  Medications:  Prior to Admission medications   Medication Sig Start Date End Date Taking? Authorizing Provider  acetaminophen (TYLENOL) 500 MG tablet Take 1 tablet (500 mg total) by mouth every 6 (six) hours as needed for moderate pain or fever. Patient taking differently: Take 1,000 mg by mouth every 6 (six) hours as needed for moderate pain, fever or headache.  09/28/18   Angiulli, Lavon Paganini, PA-C  albuterol (VENTOLIN HFA) 108 (90 Base) MCG/ACT inhaler Inhale 2 puffs into the lungs every 4 (four) hours as needed for wheezing or shortness of breath.     [provider]  Alirocumab (PRALUENT) 150 MG/ML SOAJ Inject 1 pen into the skin every 14 (fourteen) days. Patient taking differently: Inject 150 mg into the skin every 14 (fourteen) days.  08/18/18   Larey Dresser, MD  ALPRAZolam Duanne Moron) 0.5 MG tablet Take 1 tablet (0.5 mg total) by mouth 2 (two) times daily as needed for anxiety. Patient taking differently: Take 0.5-1 mg by mouth See admin instructions. Take one tablet (0.5 mg) by mouth every morning and two tablets (1 mg) at night, may also take 1 tablet (0.5 mg) midday as needed for anxiety 09/28/18   Angiulli, Lavon Paganini, PA-C  amLODipine (NORVASC) 5 MG tablet Take 1 tablet (5 mg total) by mouth daily. Patient taking differently: Take 5 mg by mouth at bedtime.  10/06/18 01/11/19  Larey Dresser, MD  apixaban (ELIQUIS) 5 MG TABS tablet Take 1 tablet (5 mg total) by mouth 2 (two) times daily. 09/28/18   Angiulli, Lavon Paganini, PA-C  bisacodyl (DULCOLAX) 5 MG EC tablet Take 1 tablet (5  mg total) by mouth daily as needed for moderate constipation. 05/19/18 05/19/19  Nicholes Stairs, MD  denosumab (PROLIA) 60 MG/ML SOSY injection Inject 60 mg into the skin every 6 (  six) months.     [provider]  DULoxetine (CYMBALTA) 30 MG capsule Take 1 capsule (30 mg total) by mouth daily. 09/28/18   Angiulli, Lavon Paganini, PA-C  ezetimibe (ZETIA) 10 MG tablet Take 1 tablet (10 mg total) by mouth at bedtime. 04/10/18   Cherene Altes, MD  Fluticasone-Salmeterol (ADVAIR) 250-50 MCG/DOSE AEPB Inhale 1 puff into the lungs daily.    [provider]  gabapentin (NEURONTIN) 300 MG capsule Take 1 capsule (300 mg total) by mouth daily. Patient taking differently: Take 300-600 mg by mouth See admin instructions. Take two capsules (600 mg) by mouth every morning and one capsule (300 mg) at night; may also take one capsule (300 mg) midday as needed for nerve pain 09/28/18   Angiulli, Lavon Paganini, PA-C  HYDROcodone-acetaminophen (NORCO/VICODIN) 5-325 MG tablet Take 1 tablet by mouth at bedtime as needed for moderate pain.    [provider]  Insulin Glargine (LANTUS SOLOSTAR) 100 UNIT/ML Solostar Pen Inject 38-44 Units into the skin See admin instructions. Inject 38 units subcutaneously daily at bedtime, increase to 44 units for CBG >200    [provider]  insulin lispro (HUMALOG KWIKPEN) 100 UNIT/ML KwikPen Inject 14-18 Units into the skin See admin instructions. Inject 14 units subcutaneously prior to breakfast and supper; add 4 units for CBG >200    [provider]  ipratropium-albuterol (DUONEB) 0.5-2.5 (3) MG/3ML SOLN Take 3 mLs by nebulization 2 (two) times daily. Patient taking differently: Take 3 mLs by nebulization 2 (two) times daily as needed (shortness of breath).  04/10/18   Cherene Altes, MD  losartan (COZAAR) 25 MG tablet Take 0.5 tablets (12.5 mg total) by mouth daily. 01/11/19   Larey Dresser, MD  metolazone (ZAROXOLYN) 2.5 MG tablet Take 1  tablet (2.5 mg total) by mouth daily as needed (As directed by heart failure clinic. Call heart failure office for direction when experiencing overnight weight gain). 12/26/18   Geradine Girt, DO  metoprolol succinate (TOPROL-XL) 50 MG 24 hr tablet Take 1 tablet (50 mg total) by mouth daily. Take with or immediately following a meal. 09/28/18   Angiulli, Lavon Paganini, PA-C  Multiple Vitamin (MULTIVITAMIN WITH MINERALS) TABS tablet Take 1 tablet by mouth every morning. Centrum - Women over 57    [provider]  Multiple Vitamins-Minerals ([place]) CAPS Take 1 capsule by mouth every morning.    [provider]  nitroGLYCERIN (NITROSTAT) 0.3 MG SL tablet Place 1 tablet (0.3 mg total) under the tongue every 5 (five) minutes x 3 doses as needed for chest pain. 09/15/17   Georgiana Shore, NP  ondansetron (ZOFRAN-ODT) 4 MG disintegrating tablet Take 4 mg by mouth 2 (two) times daily as needed for nausea or vomiting.    [provider]  OXYGEN Inhale 2 L into the lungs at bedtime as needed (shortness of breath).     [provider]  pantoprazole (PROTONIX) 40 MG tablet Take 1 tablet (40 mg total) by mouth daily. 09/28/18   Angiulli, Lavon Paganini, PA-C  Polyvinyl Alcohol-Povidone (REFRESH OP) Place 1 drop into both eyes daily as needed (redness/ dry eyes).    [provider]  potassium chloride SA (KLOR-CON) 20 MEQ tablet Take 2 tablets by mouth every morning and 1 tablet by mouth every evening 01/11/19   Larey Dresser, MD  Pyridoxine HCl (VITAMIN B-6 PO) Take 1 tablet by mouth every morning.    [provider]  ranolazine (RANEXA)  500 MG 12 hr tablet Take 1 tablet (500 mg total) by mouth 2 (two) times daily. 09/28/18   Angiulli, Lavon Paganini, PA-C  rosuvastatin (CRESTOR) 40 MG tablet Take 1 tablet (40 mg total) by mouth at bedtime. 09/28/18   Angiulli, Lavon Paganini, PA-C  spironolactone (ALDACTONE) 25 MG tablet Take 1 tablet (25 mg total) by mouth daily.  11/13/18   Larey Dresser, MD  torsemide (DEMADEX) 20 MG tablet Take 4 tablets (80 mg) by mouth every morning, take 2 tablets (40 mg) every evening 01/11/19   Larey Dresser, MD  triamcinolone cream (KENALOG) 0.1 % Apply 1 application topically daily as needed (crusty spots on arms).    [provider]  vitamin B-12 (CYANOCOBALAMIN) 1000 MCG tablet Take 1,000 mcg by mouth every morning.     [provider]  zolpidem (AMBIEN) 10 MG tablet Take 0.5 tablets (5 mg total) by mouth at bedtime. 12/26/18   Geradine Girt, DO      Allergies  Allergen Reactions  . Amoxicillin Shortness Of Breath and Rash  . Brilinta [Ticagrelor] Shortness Of Breath  . Erythromycin Shortness Of Breath, Other (See Comments) and Hives    Trouble swallowing  . Flagyl [Metronidazole] Shortness Of Breath and Palpitations  . Penicillins Hives, Shortness Of Breath, Rash and Other (See Comments)    Has patient had a PCN reaction causing immediate rash, facial/tongue/throat swelling, SOB or lightheadedness with hypotension: Yes Has patient had a PCN reaction causing severe rash involving mucus membranes or skin necrosis: No Has patient had a PCN reaction that required hospitalization: Yes Has patient had a PCN reaction occurring within the last 10 years: No If all of the above answers are "NO", then may proceed with Cephalosporin use.   . Isosorbide Mononitrate [Isosorbide Nitrate] Other (See Comments)    Joint aches, muscles hurt, difficult to walk   . Jardiance [Empagliflozin] Other (See Comments)    Nausea, joint aches, muscles aches  . Metformin And Related Other (See Comments)    Stomach pain, cold sweats, joint pain, burred vision, dizziness  . Tape Other (See Comments)    Skin pulls off with certain types Plastic tape causes skin to rip if left on for long periods of time  . Erythromycin Base Rash    ROS:  Out of a complete 14 system review of symptoms, the patient complains only of the  following symptoms, and all other reviewed systems are negative.  Fatigue Insomnia Shortness of breath Dizziness Memory disturbance  Blood pressure (!) 124/58, pulse (!) 58, temperature 98 F (36.7 C), height 5\' 2"  (1.575 m), weight 227 lb 6.4 oz (103.1 kg), SpO2 94 %.   Blood pressure, right arm, standing is 124/64.  Physical Exam  General: The patient is alert and cooperative at the time of the examination.  The patient is markedly obese.  Eyes: Pupils are equal, round, and reactive to light. Discs are flat bilaterally.  Neck: The neck is supple, no carotid bruits are noted on the left, a carotid bruit is noted on the right.  Respiratory: The respiratory examination is clear.  Cardiovascular: The cardiovascular examination reveals a regular rate and rhythm, no obvious murmurs or rubs are noted.  Skin: Extremities are with 2+ edema below the knees bilaterally.  Neurologic Exam  Mental status: The patient is alert and oriented x 3 at the time of the examination. The patient has apparent normal recent and remote memory, with an apparently normal attention span and concentration ability.  Cranial nerves: Facial symmetry is present. There is decreased sensation to pinprick on the left face as compared to the right, she does not split midline with vibration sensation. The strength of the facial muscles and the muscles to head turning and shoulder shrug are normal bilaterally. Speech is well enunciated, no aphasia or dysarthria is noted, but intermittently the patient may stutter. Extraocular movements are full. Visual fields are full. The tongue is midline, and the patient has symmetric elevation of the soft palate. No obvious hearing deficits are noted.  Motor: The motor testing reveals 5 over 5 strength of all 4 extremities. Good symmetric motor tone is noted throughout.  Sensory: Sensory testing is notable for some decrease in pinprick sensation on the left arm and leg as compared  to the right, the patient has fairly symmetric vibration sensation in both hands, she has marked impairment of position sense and vibration sensation in both feet, and she has a stocking pattern pinprick sensory deficit up to the knees bilaterally.  Position sensation in both arms are normal.  Coordination: Cerebellar testing reveals good finger-nose-finger and heel-to-shin bilaterally.  Gait and station: Gait is wide-based, the patient will walk with assistance.  Tandem gait was not attempted.  Romberg is positive, the patient has a tendency to fall backwards.  Reflexes: Deep tendon reflexes are symmetric, but are decreased bilaterally. Toes are downgoing bilaterally.   MRI brain/ MRA head 12/23/18:  IMPRESSION: Negative for acute infarct  Chronic right PCA infarct.  Chronic ischemia in the white matter  Extensive intracranial atherosclerotic disease.  * MRI scan images were reviewed online. I agree with the written report.   2D echo 12/24/18:  IMPRESSIONS    1. Left ventricular ejection fraction, by visual estimation, is 45 to 50%. The left ventricle has mildly decreased function. There is no left ventricular hypertrophy.  2. Left ventricular diastolic parameters are indeterminate.  3. Septal and posterior lateral hypokinesis.  4. Global right ventricle has moderately reduced systolic function.The right ventricular size is moderately enlarged. No increase in right ventricular wall thickness.  5. Left atrial size was severely dilated.  6. Right atrial size was severely dilated.  7. Severe calcification of the mitral valve leaflet(s).  8. Severe mitral annular calcification.  9. Severe thickening of the mitral valve leaflet(s). 10. The mitral valve is degenerative. Moderate to severe mitral valve regurgitation. No evidence of mitral stenosis. 11. The tricuspid valve is normal in structure. Tricuspid valve regurgitation moderate. 12. The aortic valve is tricuspid. Aortic valve  regurgitation is not visualized. No evidence of aortic valve sclerosis or stenosis. 13. There is Moderate thickening of the aortic valve. 14. There is Sclerosis without any evidence of stenosis of the aortic valve. 15. The pulmonic valve was grossly normal. Pulmonic valve regurgitation is moderate. 16. Moderately elevated pulmonary artery systolic pressure. 17. The inferior vena cava is normal in size with greater than 50% respiratory variability, suggesting right atrial pressure of 3 mmHg.   Carotid doppler 11/13/18:  Summary: Right Carotid: Velocities in the right ICA are consistent with a 1-39% stenosis.                The ECA appears >50% stenosed.  Left Carotid: Patent stent with no visualized stenosis. The ECA appears >50%               stenosed.  Vertebrals:  Bilateral vertebral arteries demonstrate antegrade flow. Subclavians: Normal flow hemodynamics were seen in bilateral subclavian  arteries.   EEG 12/26/18:  IMPRESSION: This study is within normal limits. No seizures or epileptiform discharges were seen throughout the recording.   Assessment/Plan:  1.  History of cerebrovascular disease  2.  Diabetes with severe diabetic peripheral neuropathy  3.  Gait disorder  4.  Chronic shortness of breath, episodic desaturations  5.  Stuttering speech, likely nonorganic  6.  Episodes of altered mental status, slurred speech  7.  Chronic fatigue, chronic insomnia  The episodes described with zoning out, staring off, slurring speech appeared to at times be associated with oxygen desaturation into the mid 80s.  The episodes likely are not related to TIA events.  The patient also has chronic insomnia with chronic fatigue excessive daytime drowsiness.  This may also be a factor in generation of the above events.  The patient is having a stuttering speech pattern off and on which likely is nonorganic.  The patient has a severe peripheral neuropathy associated with  a gait disorder.  The patient may be able to benefit from oxygen off and on during the day.  She is quite limited in her ability to ambulate in part because of shortness of breath.  The patient will be given trazodone 50 mg at night to improve sleep.  She will follow-up here in 4 months.  She does not appear to be orthostatic today.  Overall, the patient has very poor general health.  The episodes of confusion are a manifestation of this.  Jill Alexanders MD 01/12/2019 10:49 AM  [facility] [address] [place], [place] [address]  Phone [phone] Fax [phone]

## 2019-01-13 NOTE — Progress Notes (Addendum)
Date:  01/13/2019   ID:  [name] [name], DOB 1945/10/20, [name] [id number]   Provider location: [facility] Type of Visit: Established patient   PCP:  Derinda Late, MD  Cardiologist:  Dr. [name]  Chief Complaint: Exertional shortness of breath   History of Present Illness: [name] [name] is a 73 y.o. female who has a history of CAD s/p CABG, diastolic CHF, and cerebrovascular disease with history of CVA presents for followup of CHF and diastolic CHF. She had CABG x 5 in 12/11.  Prior to the CABG she had multiple PCIs.  She had a CVA in 2/14 that presented as visual loss.  She had a left carotid stent in 2/15.   Left heart cath done in Sept. 2014. This showed patent SVG-D, LIMA-LAD, and sequential SVG-OM branches but SVG-RCA and the native RCA were both totally occluded. It was felt aht her increased symptoms coincided with occlusion of SVG-RCA.  She was started on Imdur to see if this would help with the chest pain and dyspnea.  However, she feels like Imdur causes leg cramps and does not think that she can take it.  So ranolazine 500 mg bid started and titrated this up to 1000 mg bid.  This helped some but not markedly.  Therefore, sent to  Dr. Irish Lack to address opening RCA CTO. He was able to do this in 5/15 with 4 overlapping Xience DES.  This led to resolution of exertional chest pain.    [name] [name] was started on Brilinta after PCI.  She became much more short of breath after starting on Brilinta. Per Dr Delano Metz stopped and replaced with Plavix.  Dyspnea improved off Brilinta. Given increasing exertional dyspnea and chest pain,  RHC/LHC was done in 4/17.  This showed stable CAD with no interventional target.  Left and right heart filling pressures were not significantly increased.  Medical management.   She had an MRI/MRA in May 2018 that showed moderately severe stenosis of the supraclinoid segment of the right internal carotid artery, progressed  since the 2016 MR angiogram and severe basilar artery stenosis.   She developed recurrent exertional chest pain and had LHC in 6/18, showing 4/5 grafts patent with patent native RCA (similar to prior cath, no changes).    Echo 1/19 with EF 55-60%, moderate diastolic dysfunction, PASP 50 mmHg, mildly dilated RV, mild AS, mild-moderate MR.   She reported increased dyspnea and chest pain and had RHC/LHC in 9/19.  She had DES to sequential SVG-OM1/PLOM.  While hospitalized, she was noted to have runs of atrial fibrillation.  She was very symptomatic with the atrial fibrillation.  Eliquis and amiodarone were started.   She was admitted 12/16-12/19/19 for cardiomems implant and RHC. She was diuresed with IV lasix and transitioned to torsemide 100 mg BID + metolazone once/week. DC weight: 295 lbs.  She had a prolonged hospitalization in 2/20 with acute respiratory failure and fever to 103.  She was thought to have PNA and treated with cefepime.  She was intubated.  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  Amiodarone was stopped and she was put on prednisone. Echo in 2/20 showed EF down to 25-30% with mid-apical LV severe hypokinesis. She had AKI. Possible ITP triggered by infection, HIT negative.  ITP was treated with Solumedrol. She was discharged to SNF.    ORIF left ankle 4/20.   11/20 admission initially with concern for TIAs (staring spells),  ended up thinking possible partial seizures.  Head MRI did not show CVA.  Echo in 11/20 showed EF 45-50%, moderately decreased RV systolic function, moderate RV enlargement, moderate-severe MR, moderate TR.   She returns today for followup of CHF.  She is now back at home from rehab.  She is short of breath with exertion, dyspnea walking down the hall in her house.  She uses a walker.  She has orthopnea, props up in bed.  Occasional atypical chest pain that is fleeting. No BRBPR/melena.  She is taking torsemide 80 mg daily.    ECG (personally reviewed): NSR, LBBB 178 msec  Labs (6/14): K 3.8, creatinine 0.71 Labs (8/14): BNP 249, LDL 166 Labs (9/14): K 4.7, creatinine 0.7 Labs (9/14): K 4.6, creatinine 0.9, BNP 109 Labs (1/15): K 4.5, creatinine 0.73, LDL 212, HCT 43.4, TSH normal, BNP 138 Labs (6/15): K 4.7, creatinine 1.17, LDL 100, HDL 37, TGs 363, BNP 39 Labs (10/15): K 4.6, creatinine 1.2 Labs (1/16): LDL 157, HDL 43, K 5.2, creatinine 0.95 Labs (2/16): K 4.5, creatinine 1.08, BNP 113 Labs (4/16): LDL 50, HDL 56, TGs 179 Labs (9/16): K 5.3, creatinine 1.09 Labs (10/16): LDL 75, HDL 56 Labs (2/17): K 5.2, creatinine 1.07 Labs (4/17): K 5.1, creatinine 1.07 Labs (5/17): K 4.5, creatinine 1.01, LDL 96, HDL 56, BNP 70 Labs (03/2016) K 4.9, creatinine 1.07 Labs (7/18): K 4.9, creatinine 1.15 Labs (11/18): K 5.1, creatinine 1.11, LDL 36 Labs (1/19): K 4.9, creatinine 1.22 Labs (9/19): LDL 108 Labs (10/19): hgb 14.5, K 4.3, creatinine 1.03 Labs 12/06/2017: K 4.7 Creatinine 1.7 Labs (3/20): K 4.8, creatinine 1.39, hgb 12.4 plts 226 Labs (11/20): K 4.2, creatinine 1.01, LDL 38  PMH: 1. Diabetic gastroparesis 2. Type II diabetes 3. HTN 4. Morbid obesity 5. CAD: s/p CABG in 12/11 after prior PCIs.  LIMA-LAD, SVG-D, seq SVG-OM1 and OM2, SVG-PDA. Adenosine Cardiolite (8/14) with EF 53% and a small reversible apical defect with a medium, partially reversible inferior defect.  LHC (9/14) with patent SVG-D, LIMA-LAD (50% distal LAD), and sequential SVG-OM branches; the SVG-RCA and the native RCA were occluded.  This was managed medically initially, but with ongoing exertional chest pain, it was decided to open CTO.  Patient had CTO opening with 4 overlapping Xience DES in the RCA in 5/15.   - LHC (4/17): SVG-D patent, LIMA-LAD patent, distal LAD with several 40-50% stenoses, sequential SVG-OM1 and PLOM patent with 50% proximal stenosis (not flow limiting), SVG-RCA TO with patent RCA stents.  - LHC (6/18): 4/5  grafts patent (SVG-RCA TO, same as past), RCA stents patent => no change.  - LHC (9/19): Sequential SVG-PLOM/OM1 with 80% proximal stenosis, s/p DES.  6. Atypical migraines 7. OHS/OSA: Intolerant of CPAP. Uses oxygen with exertion and at night.  8. GERD with hiatal hernia 9. OA 10. Chronic systolic CHF: Echo (17/61) with EF 50-55%, grade II diastolic dysfunction, mild-moderate MR.  Echo (8/14) with EF 55-60%, grade II diastolic dysfunction, mildly increased aortic valve gradient (mean 12 mmHg) but valve opens well, mild MR and mild RV dilation.  Echo (9/15) with EF 60-65%, grade II diastolic dysfunction, mild aortic stenosis, mild mitral stenosis, mild to moderate mitral regurgitation, mildly dilated RV with normal systolic function, PA systolic pressure 46 mmHg.  - RHC (4/17): mean RA 9, PA 38/15 mean 27, mean PCWP 9, CI 2.5.  - Echo (5/17): EF 55-60%, mild LVH, mildly dilated RV with low normal systolic function, moderate TR, PASP 61 mmHg - Echo (  1/19): EF 55-60%, moderate diastolic dysfunction, PASP 50 mmHg, mildly dilated RV, mild AS, mild-moderate MR. - RHC (9/19): mean RA 11, PA 34/12, mean PCWP 15, CI 2.85 - Echo (3/20): EF 25-30% with mid-apical severe hypokinesis.  - Echo (11/20): EF 45-50%, moderately dilated RV with moderately decreased systolic function, moderate-severe MR, moderate TR.  11. CKD 12. Chronic LBBB 13. Anxiety 14. Carotid stenosis: Followed by [facility], >76% LICA stenosis 19/50.  She had left carotid stent in 2/15. Carotid dopplers 8/15 with no significant disease. Carotid dopplers (4/16): < 40% RICA stenosis.  15. Cerebrovascular disease: CVA 2/14 with right posterior cerebral artery territory ischemic infarction. Cerebral angiogram in 6/14 showed 70% right vertebral ostial stenosis, 93-26% LICA stenosis, > 71% proximal left posterior cerebral artery stenosis, posterior communicating artery aneurysm.  Patient has had episodes of transient expressive aphasia.  Carotid dopplers  (24/58) showed >09% LICA stenosis. She had a left carotid stent 2/15.  Possible CVA in 7/15. -  MRI/MRA in May 2018 that showed moderately severe stenosis of the supraclinoid segment of the right internal carotid artery, progressed since the 2016 MR angiogram and severe basilar artery stenosis.  16. Positional vertigo (suspected) 17. Palpitations: Holter (6/15) with rare PVCs/PACs.  - Event monitor (2/18): NSR, occasional PVCs 18. Dyspnea with Brilinta. 19. Melanoma: On face, s/p excision.   20. Aortic stenosis: Mild.  21. Holter (8/16) with no significant arrhythmias.  22. Lower extremity arterial dopplers (9/16) were normal.  23. Sleep study (4/18): No significant OSA.  24. Atrial fibrillation: Paroxysmal - Amiodarone stopped, ?lung toxicity.  25. H/o ITP in 3/20.  16. Partial seizures   Current Outpatient Medications  Medication Sig Dispense Refill  . acetaminophen (TYLENOL) 500 MG tablet Take 1 tablet (500 mg total) by mouth every 6 (six) hours as needed for moderate pain or fever. (Patient taking differently: Take 1,000 mg by mouth every 6 (six) hours as needed for moderate pain, fever or headache. ) 30 tablet 0  . albuterol (VENTOLIN HFA) 108 (90 Base) MCG/ACT inhaler Inhale 2 puffs into the lungs every 4 (four) hours as needed for wheezing or shortness of breath.     . Alirocumab (PRALUENT) 150 MG/ML SOAJ Inject 1 pen into the skin every 14 (fourteen) days. (Patient taking differently: Inject 150 mg into the skin every 14 (fourteen) days. ) 2 pen 11  . ALPRAZolam (XANAX) 0.5 MG tablet Take 1 tablet (0.5 mg total) by mouth 2 (two) times daily as needed for anxiety. (Patient taking differently: Take 0.5-1 mg by mouth See admin instructions. Take one tablet (0.5 mg) by mouth every morning and two tablets (1 mg) at night, may also take 1 tablet (0.5 mg) midday as needed for anxiety) 20 tablet 0  . apixaban (ELIQUIS) 5 MG TABS tablet Take 1 tablet (5 mg total) by mouth 2 (two) times daily.  60 tablet 6  . bisacodyl (DULCOLAX) 5 MG EC tablet Take 1 tablet (5 mg total) by mouth daily as needed for moderate constipation. 30 tablet 1  . denosumab (PROLIA) 60 MG/ML SOSY injection Inject 60 mg into the skin every 6 (six) months.     . DULoxetine (CYMBALTA) 30 MG capsule Take 1 capsule (30 mg total) by mouth daily. 30 capsule 0  . ezetimibe (ZETIA) 10 MG tablet Take 1 tablet (10 mg total) by mouth at bedtime.    . Fluticasone-Salmeterol (ADVAIR) 250-50 MCG/DOSE AEPB Inhale 1 puff into the lungs daily.    Marland Kitchen gabapentin (NEURONTIN) 300 MG capsule Take  1 capsule (300 mg total) by mouth daily. (Patient taking differently: Take 300-600 mg by mouth See admin instructions. Take two capsules (600 mg) by mouth every morning and one capsule (300 mg) at night; may also take one capsule (300 mg) midday as needed for nerve pain) 30 capsule 1  . HYDROcodone-acetaminophen (NORCO/VICODIN) 5-325 MG tablet Take 1 tablet by mouth at bedtime as needed for moderate pain.    . Insulin Glargine (LANTUS SOLOSTAR) 100 UNIT/ML Solostar Pen Inject 38-44 Units into the skin See admin instructions. Inject 38 units subcutaneously daily at bedtime, increase to 44 units for CBG >200    . insulin lispro (HUMALOG KWIKPEN) 100 UNIT/ML KwikPen Inject 14-18 Units into the skin See admin instructions. Inject 14 units subcutaneously prior to breakfast and supper; add 4 units for CBG >200    . ipratropium-albuterol (DUONEB) 0.5-2.5 (3) MG/3ML SOLN Take 3 mLs by nebulization 2 (two) times daily. (Patient taking differently: Take 3 mLs by nebulization 2 (two) times daily as needed (shortness of breath). ) 360 mL   . metolazone (ZAROXOLYN) 2.5 MG tablet Take 1 tablet (2.5 mg total) by mouth daily as needed (As directed by heart failure clinic. Call heart failure office for direction when experiencing overnight weight gain).    . metoprolol succinate (TOPROL-XL) 50 MG 24 hr tablet Take 1 tablet (50 mg total) by mouth daily. Take with or  immediately following a meal. 30 tablet 1  . Multiple Vitamin (MULTIVITAMIN WITH MINERALS) TABS tablet Take 1 tablet by mouth every morning. Centrum - Women over 14    . Multiple Vitamins-Minerals (OCUVITE EYE HEALTH FORMULA) CAPS Take 1 capsule by mouth every morning.    . nitroGLYCERIN (NITROSTAT) 0.3 MG SL tablet Place 1 tablet (0.3 mg total) under the tongue every 5 (five) minutes x 3 doses as needed for chest pain. 9 tablet 1  . ondansetron (ZOFRAN-ODT) 4 MG disintegrating tablet Take 4 mg by mouth 2 (two) times daily as needed for nausea or vomiting.    . OXYGEN Inhale 2 L into the lungs at bedtime as needed (shortness of breath).     . pantoprazole (PROTONIX) 40 MG tablet Take 1 tablet (40 mg total) by mouth daily. 30 tablet 0  . Polyvinyl Alcohol-Povidone (REFRESH OP) Place 1 drop into both eyes daily as needed (redness/ dry eyes).    . potassium chloride SA (KLOR-CON) 20 MEQ tablet Take 2 tablets by mouth every morning and 1 tablet by mouth every evening (Patient taking differently: Take 1 tablets by mouth every morning and 1 tablet by mouth every evening) 90 tablet 2  . Pyridoxine HCl (VITAMIN B-6 PO) Take 1 tablet by mouth every morning.    . ranolazine (RANEXA) 500 MG 12 hr tablet Take 1 tablet (500 mg total) by mouth 2 (two) times daily. 60 tablet 1  . rosuvastatin (CRESTOR) 40 MG tablet Take 1 tablet (40 mg total) by mouth at bedtime. 30 tablet 0  . spironolactone (ALDACTONE) 25 MG tablet Take 1 tablet (25 mg total) by mouth daily. 30 tablet 3  . torsemide (DEMADEX) 20 MG tablet Take 4 tablets (80 mg) by mouth every morning, take 2 tablets (40 mg) every evening 180 tablet 2  . triamcinolone cream (KENALOG) 0.1 % Apply 1 application topically daily as needed (crusty spots on arms).    . vitamin B-12 (CYANOCOBALAMIN) 1000 MCG tablet Take 1,000 mcg by mouth every morning.     . zolpidem (AMBIEN) 10 MG tablet Take 0.5 tablets (5  mg total) by mouth at bedtime. 30 tablet 0  . losartan  (COZAAR) 25 MG tablet Take 0.5 tablets (12.5 mg total) by mouth daily. 30 tablet 2  . traZODone (DESYREL) 50 MG tablet Take 1 tablet (50 mg total) by mouth at bedtime. 30 tablet 1   No current facility-administered medications for this encounter.    Allergies:   Amoxicillin, Brilinta [ticagrelor], Erythromycin, Flagyl [metronidazole], Penicillins, Isosorbide mononitrate [isosorbide nitrate], Jardiance [empagliflozin], Metformin and related, Tape, and Erythromycin base   Social History:  The patient  reports that she has never smoked. She has never used smokeless tobacco. She reports that she does not drink alcohol or use drugs.   Family History:  The patient's family history includes AAA (abdominal aortic aneurysm) in her father and mother; Cancer in her sister; Deep vein thrombosis in her father and mother; Diabetes in her father, paternal aunt, paternal grandmother, paternal uncle, and paternal uncle; Heart attack in her father; Heart attack (age of onset: 86) in her paternal grandfather; Heart disease in her father and paternal uncle; Hyperlipidemia in her father; Hypertension in her father, mother, and sister.   ROS:  Please see the history of present illness.   All other systems are personally reviewed and negative.   Exam:   BP (!) 153/69   Pulse 62   Wt 102.3 kg (225 lb 9.6 oz)   SpO2 100%   BMI 41.26 kg/m  General: NAD Neck: JVP 8-9 cm with HJR, no thyromegaly or thyroid nodule.  Lungs: Clear to auscultation bilaterally with normal respiratory effort. CV: Nondisplaced PMI.  Heart regular S1/S2 with paradoxically split S2, no S3/S4, 2/6 HSM apex. Trace ankle edema.  No carotid bruit.  Normal pedal pulses.  Abdomen: Soft, nontender, no hepatosplenomegaly, no distention.  Skin: Intact without lesions or rashes.  Neurologic: Alert and oriented x 3.  Psych: Normal affect. Extremities: No clubbing or cyanosis.  HEENT: Normal.    Recent Labs: 09/18/2018: TSH 1.571 09/20/2018:  Magnesium 2.0 12/23/2018: ALT 17 12/26/2018: Hemoglobin 9.3; Platelets 167 01/11/2019: B Natriuretic Peptide 473.7; BUN 19; Creatinine, Ser 1.46; Potassium 3.5; Sodium 140  Personally reviewed   Wt Readings from Last 3 Encounters:  01/12/19 103.1 kg (227 lb 6.4 oz)  01/11/19 102.3 kg (225 lb 9.6 oz)  12/23/18 103.7 kg (228 lb 9.9 oz)     ASSESSMENT AND PLAN:  1. CAD: Occluded native RCA and SVG-RCA. Status post opening of CTO RCA with 4 overlapping Xience DES in 5/15. DES to sequential SVG-OM1/PLOM in 9/19.  She has been on Plavix and Eliquis.  Occasional atypical chest pain.  - She can stop Plavix at this point, has been > 1 year since PCI.  - She has not tolerated Imdur in the past. Continue Toprol XL and ranolazine.   2. Chronic systolic CHF: Echo in 3/29 with EF 55-60%, moderate diastolic dysfunction. Cardiomems placed 01/16/18.  Echo in 3/20 in setting of severe PNA with intubation showed EF 25-30%, mid-apical LV severe hypokinesis.  Possible stress (Takotsubo-type) cardiomyopathy related to severe medical illness versus progression of CAD.  She has baseline chronic LBBB which could play a role in cardiomyopathy as well (LBBB cardiomyopathy).  Repeat echo in 11/20 with EF up to 45-50%, moderately dysfunctional RV.  NYHA class III symptoms and volume overloaded on exam.  - She will start sending Cardiomems transmissions again.   - Increase torsemide to 80 qam/40 qpm, increase KCl to 40 qam/20 qpm.  BMET today and again in 10 days.  -  Continue spironolactone 25 mg daily and Toprol XL 50 mg daily.  - Restart losartan 12.5 mg daily.  3. Hyperlipidemia: She remains on Zetia, Crestor and Praluent. Lipids at goal in 11/20.  4. Hypertension: BP elevated, restarting losartan.  5. Cerebrovascular disease: She has history of CVA and had left carotid stent in 2/15. Followed by [facility].  Most recent MRA head showed severe stenosis of supraclinoid RICA and severe basilar artery stenosis. No change. 6.  OHS/OSA: Cannot tolerate CPAP.  Uses oxygen with exertion and at night. No change. 7. Atrial fibrillation: Paroxysmal, no recent palpitations. 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  She is in NSR today.  - She has completed course of prednisone for amiodarone lung toxicity.    - Continue apixaban 5 mg bid.  8. Obesity: Weight has trended down over the last year.  9. Mitral regurgitation: Moderate to severe MR on last echo in 11/20.   - We discussed TEE to assess mitral regurgitation.  If it looks severe, she may be a Mitraclip candidate.  We discussed risks/benefits and she agrees to procedure.   Followup in 3 wks.   Signed, Loralie Champagne, MD  01/13/2019  [facility] [address] [facility] [place] [address] [phone] (office) [phone] (fax)

## 2019-01-13 NOTE — Addendum Note (Signed)
Encounter addended by: Larey Dresser, MD on: 01/13/2019 9:36 PM  Actions taken: Clinical Note Signed

## 2019-01-15 ENCOUNTER — Other Ambulatory Visit (HOSPITAL_COMMUNITY)
Admission: RE | Admit: 2019-01-15 | Discharge: 2019-01-15 | Disposition: A | Discharge status: Home / Self Care | Payer: Medicare Other | Source: Ambulatory Visit | Attending: Cardiology | Admitting: Cardiology

## 2019-01-15 DIAGNOSIS — R001 Bradycardia, unspecified: Secondary | ICD-10-CM | POA: Diagnosis not present

## 2019-01-16 ENCOUNTER — Telehealth: Payer: Self-pay | Admitting: Physician Assistant

## 2019-01-16 ENCOUNTER — Emergency Department (HOSPITAL_COMMUNITY): Payer: Medicare Other

## 2019-01-16 ENCOUNTER — Inpatient Hospital Stay (HOSPITAL_COMMUNITY)
Admission: EM | Admit: 2019-01-16 | Discharge: 2019-01-19 | DRG: 308 | Disposition: A | Discharge status: Home Health | Payer: Medicare Other | Attending: Internal Medicine | Admitting: Internal Medicine

## 2019-01-16 ENCOUNTER — Other Ambulatory Visit: Payer: Self-pay

## 2019-01-16 ENCOUNTER — Encounter (HOSPITAL_COMMUNITY): Payer: Self-pay

## 2019-01-16 DIAGNOSIS — I4891 Unspecified atrial fibrillation: Secondary | ICD-10-CM

## 2019-01-16 DIAGNOSIS — L89322 Pressure ulcer of left buttock, stage 2: Secondary | ICD-10-CM | POA: Diagnosis present

## 2019-01-16 DIAGNOSIS — D638 Anemia in other chronic diseases classified elsewhere: Secondary | ICD-10-CM | POA: Diagnosis present

## 2019-01-16 DIAGNOSIS — K219 Gastro-esophageal reflux disease without esophagitis: Secondary | ICD-10-CM | POA: Diagnosis present

## 2019-01-16 DIAGNOSIS — E875 Hyperkalemia: Secondary | ICD-10-CM | POA: Diagnosis present

## 2019-01-16 DIAGNOSIS — G4733 Obstructive sleep apnea (adult) (pediatric): Secondary | ICD-10-CM | POA: Diagnosis present

## 2019-01-16 DIAGNOSIS — I130 Hypertensive heart and chronic kidney disease with heart failure and stage 1 through stage 4 chronic kidney disease, or unspecified chronic kidney disease: Secondary | ICD-10-CM | POA: Diagnosis present

## 2019-01-16 DIAGNOSIS — K449 Diaphragmatic hernia without obstruction or gangrene: Secondary | ICD-10-CM | POA: Diagnosis present

## 2019-01-16 DIAGNOSIS — I5022 Chronic systolic (congestive) heart failure: Secondary | ICD-10-CM | POA: Diagnosis not present

## 2019-01-16 DIAGNOSIS — D649 Anemia, unspecified: Secondary | ICD-10-CM | POA: Diagnosis present

## 2019-01-16 DIAGNOSIS — E1142 Type 2 diabetes mellitus with diabetic polyneuropathy: Secondary | ICD-10-CM | POA: Diagnosis present

## 2019-01-16 DIAGNOSIS — N179 Acute kidney failure, unspecified: Secondary | ICD-10-CM | POA: Diagnosis not present

## 2019-01-16 DIAGNOSIS — E785 Hyperlipidemia, unspecified: Secondary | ICD-10-CM | POA: Diagnosis present

## 2019-01-16 DIAGNOSIS — Z833 Family history of diabetes mellitus: Secondary | ICD-10-CM

## 2019-01-16 DIAGNOSIS — I447 Left bundle-branch block, unspecified: Secondary | ICD-10-CM | POA: Diagnosis present

## 2019-01-16 DIAGNOSIS — F329 Major depressive disorder, single episode, unspecified: Secondary | ICD-10-CM | POA: Diagnosis present

## 2019-01-16 DIAGNOSIS — Z8673 Personal history of transient ischemic attack (TIA), and cerebral infarction without residual deficits: Secondary | ICD-10-CM

## 2019-01-16 DIAGNOSIS — R531 Weakness: Secondary | ICD-10-CM

## 2019-01-16 DIAGNOSIS — E1151 Type 2 diabetes mellitus with diabetic peripheral angiopathy without gangrene: Secondary | ICD-10-CM | POA: Diagnosis present

## 2019-01-16 DIAGNOSIS — I639 Cerebral infarction, unspecified: Secondary | ICD-10-CM | POA: Diagnosis present

## 2019-01-16 DIAGNOSIS — Z20828 Contact with and (suspected) exposure to other viral communicable diseases: Secondary | ICD-10-CM | POA: Diagnosis present

## 2019-01-16 DIAGNOSIS — Z85828 Personal history of other malignant neoplasm of skin: Secondary | ICD-10-CM

## 2019-01-16 DIAGNOSIS — T502X5A Adverse effect of carbonic-anhydrase inhibitors, benzothiadiazides and other diuretics, initial encounter: Secondary | ICD-10-CM | POA: Diagnosis present

## 2019-01-16 DIAGNOSIS — E1165 Type 2 diabetes mellitus with hyperglycemia: Secondary | ICD-10-CM | POA: Diagnosis not present

## 2019-01-16 DIAGNOSIS — G8929 Other chronic pain: Secondary | ICD-10-CM | POA: Diagnosis present

## 2019-01-16 DIAGNOSIS — N183 Chronic kidney disease, stage 3 unspecified: Secondary | ICD-10-CM | POA: Diagnosis present

## 2019-01-16 DIAGNOSIS — M545 Low back pain: Secondary | ICD-10-CM | POA: Diagnosis present

## 2019-01-16 DIAGNOSIS — Z794 Long term (current) use of insulin: Secondary | ICD-10-CM

## 2019-01-16 DIAGNOSIS — R001 Bradycardia, unspecified: Principal | ICD-10-CM | POA: Diagnosis present

## 2019-01-16 DIAGNOSIS — Z8041 Family history of malignant neoplasm of ovary: Secondary | ICD-10-CM

## 2019-01-16 DIAGNOSIS — I252 Old myocardial infarction: Secondary | ICD-10-CM

## 2019-01-16 DIAGNOSIS — M1990 Unspecified osteoarthritis, unspecified site: Secondary | ICD-10-CM | POA: Diagnosis present

## 2019-01-16 DIAGNOSIS — I5043 Acute on chronic combined systolic (congestive) and diastolic (congestive) heart failure: Secondary | ICD-10-CM | POA: Diagnosis present

## 2019-01-16 DIAGNOSIS — E1122 Type 2 diabetes mellitus with diabetic chronic kidney disease: Secondary | ICD-10-CM | POA: Diagnosis present

## 2019-01-16 DIAGNOSIS — I081 Rheumatic disorders of both mitral and tricuspid valves: Secondary | ICD-10-CM | POA: Diagnosis present

## 2019-01-16 DIAGNOSIS — E876 Hypokalemia: Secondary | ICD-10-CM | POA: Diagnosis present

## 2019-01-16 DIAGNOSIS — J9611 Chronic respiratory failure with hypoxia: Secondary | ICD-10-CM | POA: Diagnosis present

## 2019-01-16 DIAGNOSIS — F5104 Psychophysiologic insomnia: Secondary | ICD-10-CM | POA: Diagnosis present

## 2019-01-16 DIAGNOSIS — Z7951 Long term (current) use of inhaled steroids: Secondary | ICD-10-CM

## 2019-01-16 DIAGNOSIS — Z7901 Long term (current) use of anticoagulants: Secondary | ICD-10-CM

## 2019-01-16 DIAGNOSIS — Z8249 Family history of ischemic heart disease and other diseases of the circulatory system: Secondary | ICD-10-CM

## 2019-01-16 DIAGNOSIS — I340 Nonrheumatic mitral (valve) insufficiency: Secondary | ICD-10-CM

## 2019-01-16 DIAGNOSIS — I959 Hypotension, unspecified: Secondary | ICD-10-CM | POA: Diagnosis present

## 2019-01-16 DIAGNOSIS — I672 Cerebral atherosclerosis: Secondary | ICD-10-CM | POA: Diagnosis present

## 2019-01-16 DIAGNOSIS — I25708 Atherosclerosis of coronary artery bypass graft(s), unspecified, with other forms of angina pectoris: Secondary | ICD-10-CM | POA: Diagnosis present

## 2019-01-16 DIAGNOSIS — E1129 Type 2 diabetes mellitus with other diabetic kidney complication: Secondary | ICD-10-CM | POA: Diagnosis present

## 2019-01-16 DIAGNOSIS — Z955 Presence of coronary angioplasty implant and graft: Secondary | ICD-10-CM

## 2019-01-16 DIAGNOSIS — Z8349 Family history of other endocrine, nutritional and metabolic diseases: Secondary | ICD-10-CM

## 2019-01-16 DIAGNOSIS — I25118 Atherosclerotic heart disease of native coronary artery with other forms of angina pectoris: Secondary | ICD-10-CM | POA: Diagnosis present

## 2019-01-16 DIAGNOSIS — I480 Paroxysmal atrial fibrillation: Secondary | ICD-10-CM | POA: Diagnosis present

## 2019-01-16 DIAGNOSIS — Z9981 Dependence on supplemental oxygen: Secondary | ICD-10-CM

## 2019-01-16 LAB — BASIC METABOLIC PANEL
Anion gap: 15 (ref 5–15)
BUN: 40 mg/dL — ABNORMAL HIGH (ref 8–23)
CO2: 24 mmol/L (ref 22–32)
Calcium: 9 mg/dL (ref 8.9–10.3)
Chloride: 97 mmol/L — ABNORMAL LOW (ref 98–111)
Creatinine, Ser: 2.28 mg/dL — ABNORMAL HIGH (ref 0.44–1.00)
GFR calc Af Amer: 24 mL/min — ABNORMAL LOW (ref >60)
GFR calc non Af Amer: 21 mL/min — ABNORMAL LOW (ref >60)
Glucose, Bld: 141 mg/dL — ABNORMAL HIGH (ref 70–99)
Potassium: 5.2 mmol/L — ABNORMAL HIGH (ref 3.5–5.1)
Sodium: 136 mmol/L (ref 135–145)

## 2019-01-16 LAB — NOVEL CORONAVIRUS, NAA (HOSP ORDER, SEND-OUT TO REF LAB; TAT 18-24 HRS): SARS-CoV-2, NAA: NOT DETECTED

## 2019-01-16 LAB — CBC
HCT: 34.5 % — ABNORMAL LOW (ref 36.0–46.0)
Hemoglobin: 9.9 g/dL — ABNORMAL LOW (ref 12.0–15.0)
MCH: 24.5 pg — ABNORMAL LOW (ref 26.0–34.0)
MCHC: 28.7 g/dL — ABNORMAL LOW (ref 30.0–36.0)
MCV: 85.4 fL (ref 80.0–100.0)
Platelets: 217 10*3/uL (ref 150–400)
RBC: 4.04 MIL/uL (ref 3.87–5.11)
RDW: 18.9 % — ABNORMAL HIGH (ref 11.5–15.5)
WBC: 10.8 10*3/uL — ABNORMAL HIGH (ref 4.0–10.5)
nRBC: 0 % (ref 0.0–0.2)

## 2019-01-16 LAB — TROPONIN I (HIGH SENSITIVITY)
Troponin I (High Sensitivity): 25 ng/L — ABNORMAL HIGH (ref <18)
Troponin I (High Sensitivity): 25 ng/L — ABNORMAL HIGH (ref <18)

## 2019-01-16 MED ORDER — ROSUVASTATIN CALCIUM 20 MG PO TABS
40.0000 mg | ORAL_TABLET | Freq: Every day | ORAL | Status: DC
  Administered 2019-01-17 – 2019-01-18 (×2): 40 mg via ORAL
  Filled 2019-01-16 (×2): qty 2

## 2019-01-16 MED ORDER — SODIUM CHLORIDE 0.9 % IV SOLN
1000.0000 mL | INTRAVENOUS | Status: DC
  Administered 2019-01-16 (×2): 1000 mL via INTRAVENOUS

## 2019-01-16 MED ORDER — SODIUM CHLORIDE 0.9% FLUSH
3.0000 mL | Freq: Two times a day (BID) | INTRAVENOUS | Status: DC
  Administered 2019-01-17 – 2019-01-19 (×5): 3 mL via INTRAVENOUS

## 2019-01-16 MED ORDER — ONDANSETRON HCL 4 MG/2ML IJ SOLN
4.0000 mg | Freq: Four times a day (QID) | INTRAMUSCULAR | Status: DC | PRN
  Administered 2019-01-18 – 2019-01-19 (×3): 4 mg via INTRAVENOUS
  Filled 2019-01-16 (×3): qty 2

## 2019-01-16 MED ORDER — SODIUM CHLORIDE 0.9% FLUSH: 3.0000 mL | INTRAVENOUS | Status: DC | PRN

## 2019-01-16 MED ORDER — ACETAMINOPHEN 325 MG PO TABS: 650.0000 mg | ORAL_TABLET | Freq: Four times a day (QID) | ORAL | Status: DC | PRN

## 2019-01-16 MED ORDER — APIXABAN 5 MG PO TABS
5.0000 mg | ORAL_TABLET | Freq: Two times a day (BID) | ORAL | Status: DC
  Administered 2019-01-17 – 2019-01-19 (×6): 5 mg via ORAL
  Filled 2019-01-16 (×6): qty 1

## 2019-01-16 MED ORDER — INSULIN ASPART 100 UNIT/ML ~~LOC~~ SOLN
0.0000 [IU] | Freq: Every day | SUBCUTANEOUS | Status: DC
  Administered 2019-01-17: 3 [IU] via SUBCUTANEOUS
  Administered 2019-01-18: 2 [IU] via SUBCUTANEOUS

## 2019-01-16 MED ORDER — ACETAMINOPHEN 650 MG RE SUPP: 650.0000 mg | Freq: Four times a day (QID) | RECTAL | Status: DC | PRN

## 2019-01-16 MED ORDER — INSULIN GLARGINE 100 UNIT/ML ~~LOC~~ SOLN
15.0000 [IU] | Freq: Every day | SUBCUTANEOUS | Status: DC
  Administered 2019-01-17 – 2019-01-18 (×3): 15 [IU] via SUBCUTANEOUS
  Filled 2019-01-16 (×4): qty 0.15

## 2019-01-16 MED ORDER — SODIUM CHLORIDE 0.9 % IV SOLN: 250.0000 mL | INTRAVENOUS | Status: DC | PRN

## 2019-01-16 MED ORDER — SODIUM CHLORIDE 0.9 % IV BOLUS (SEPSIS)
500.0000 mL | Freq: Once | INTRAVENOUS | Status: AC
  Administered 2019-01-16: 500 mL via INTRAVENOUS

## 2019-01-16 MED ORDER — ONDANSETRON HCL 4 MG PO TABS: 4.0000 mg | ORAL_TABLET | Freq: Four times a day (QID) | ORAL | Status: DC | PRN

## 2019-01-16 MED ORDER — INSULIN ASPART 100 UNIT/ML ~~LOC~~ SOLN
0.0000 [IU] | Freq: Three times a day (TID) | SUBCUTANEOUS | Status: DC
  Administered 2019-01-17: 3 [IU] via SUBCUTANEOUS
  Administered 2019-01-18: 2 [IU] via SUBCUTANEOUS
  Administered 2019-01-18 – 2019-01-19 (×2): 1 [IU] via SUBCUTANEOUS
  Administered 2019-01-19: 3 [IU] via SUBCUTANEOUS

## 2019-01-16 MED ORDER — ATROPINE SULFATE 1 MG/10ML IJ SOSY
0.5000 mg | PREFILLED_SYRINGE | Freq: Once | INTRAMUSCULAR | Status: AC
  Administered 2019-01-16: 0.5 mg via INTRAVENOUS
  Filled 2019-01-16: qty 10

## 2019-01-16 MED ORDER — SODIUM CHLORIDE 0.9% FLUSH
3.0000 mL | Freq: Two times a day (BID) | INTRAVENOUS | Status: DC
  Administered 2019-01-17 (×2): 3 mL via INTRAVENOUS

## 2019-01-16 NOTE — Telephone Encounter (Signed)
Ms. Darcella Cheshire called because Ms. Gillyard blood pressure had been in the 25G systolic today.  She was extremely weak.  They had taken her to the emergency room.    She wanted to be sure Dr. [name] was aware.  She had seen Dr. [name] on 12/10 and was started on losartan 12.5 mg daily in addition to increasing her torsemide and potassium.  I advised that we had not yet been contacted to see Ms. Brothers, the best thing is likely to let the ER finish their evaluation and then call us if we are needed.  Rosaria Ferries, PA-C 01/16/2019 6:11 PM Bee[facility] [phone]

## 2019-01-16 NOTE — ED Provider Notes (Signed)
[facility] EMERGENCY DEPARTMENT Provider Note   CSN: [id number] Arrival date & time: 01/16/19  1554     History Chief Complaint  Patient presents with   Fatigue   Bradycardia    [name] [name] is a 73 y.o. female.  HPI   Patient has a history of multiple medical problems including diabetes, atrial fibrillation, peripheral neuropathy, and gait disorder.  Patient has been having issues with episodes of slurred speech, zoning out and hallucinations.  She has been admitted to the hospital previously for this.  Last on November 21.  Patient subsequently has been seen by neurology and cardiology.  Patient was seen by Dr. Jannifer Franklin on November 11.  Since November the patient has been having episodes of stuttering.  Patient states she is also having episodes of weakness.  Today she was at home using her walker trying to go to the bathroom where she had significant difficulty.  Patient was feeling weak and very fatigued.  She was having difficulty moving her legs and family members had to assist her by having something behind her to sit down on.  Patient's blood pressure was checked at home and it was low in the 70s.  Patient also reported that her heart rate was low in the 30s.  He came to the ED for evaluation.  Patient currently denies any trouble with chest pain or fever.  She has noticed that her start is worse and is having generalized weakness in her legs.  No headache.  No facial droop.  No unilateral numbness or weakness.  Past Medical History:  Diagnosis Date   Anemia    hx   Anxiety    Asthma    Basal cell carcinoma 05/2014   "left shoulder"   Bundle branch block, left    chronic/notes 07/18/2013   CHF (congestive heart failure) ([place])    Chronic insomnia 05/06/2015   Chronic kidney disease    frequency, sees dr Jamal Maes every 4 to 6 months (01/16/2018)   Chronic lower back pain    Claustrophobia    Common migraine 05/14/2014   Coronary artery  disease    MI in 2001, 2002, 2006, 2011, 2014   Depression    Diabetic peripheral neuropathy ([place]) 01/12/2019   GERD (gastroesophageal reflux disease)    H/O hiatal hernia    Headache    "at least 2/month" (01/16/2018)   Heart murmur    Hyperlipidemia    Hypertension    Memory change 05/14/2014   Migraine    "5-6/year"  (01/16/2018)   Obesity 01-2010   Obstructive sleep apnea    "can't wear machine; I have claustrophobia" (01/16/2018), states she had a 2nd sleep study and does not have sleep apnea, her O2 decreases and now is on 2 L of O2 at night.   On home oxygen therapy    "2L at night and prn during daytime" (01/16/2018)   Osteoarthritis    "knees and hands" (01/16/2018)   Peripheral vascular disease ([place])    ? numbness, tingling arms and legs   PONV (postoperative nausea and vomiting)    Stroke ([place])    2014, 2015, 2016   Type II diabetes mellitus ([place])    Ventral hernia    hx of    Patient Active Problem List   Diagnosis Date Noted   Diabetic peripheral neuropathy ([place]) 01/12/2019   Neurologic abnormality 12/25/2018   Debility 09/21/2018   Hypotension 09/18/2018   Bradycardia 09/18/2018   AKI (acute kidney  injury) ([place]) 09/18/2018   Pressure injury of skin 09/18/2018   Ischemic cerebrovascular accident (CVA) of frontal lobe ([place]) 09/13/2018   Fall 09/09/2018   Stroke ([place]) 09/09/2018   Type II diabetes mellitus with renal manifestations ([place]) 09/09/2018   CKD (chronic kidney disease), stage III 09/09/2018   Sepsis ([place]) 09/09/2018   Depression with anxiety 09/09/2018   Slurred speech 09/09/2018   Ankle fracture, left 05/16/2018   Amiodarone pulmonary toxicity    Physical deconditioning    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    Acute on chronic congestive heart failure ([place])    Chronic combined systolic (congestive) and diastolic (congestive) heart failure ([place]) 01/16/2018   Acute on chronic diastolic CHF (congestive heart  failure) ([place]) 01/16/2018   Atrial fibrillation ([place]) 10/21/2017   CAD (coronary artery disease) of bypass graft 10/20/2017   Coronary artery disease of bypass graft of native heart with stable angina pectoris (HCC)    Chronic low back pain 08/24/2016   Pain of left thumb 08/05/2016   Nocturnal hypoxia 06/02/2016   Hoarseness 04/05/2016   Laryngopharyngeal reflux (LPR) 04/05/2016   Pharyngoesophageal dysphagia 04/05/2016   Angina decubitus ([place]) 05/22/2015   Chronic insomnia 05/06/2015   Common migraine 05/14/2014   Abnormality of gait 05/14/2014   Memory change 05/14/2014   Aneurysm, cerebral, nonruptured 05/14/2014   Cramp of limb-Left neck 05/10/2014   Hematemesis with nausea    Vomiting blood    Dizziness and giddiness 09/21/2013   Atypical chest pain 07/18/2013   Unstable angina (HCC) 04/09/2013   Carotid artery stenosis, symptomatic 03/20/2013   Cerebrovascular disease 07/10/2012   Cerebral artery occlusion with cerebral infarction ([place]) 07/10/2012   Mitral regurgitation 04/15/2012   TIA (transient ischemic attack) 04/15/2012   Occlusion and stenosis of carotid artery without mention of cerebral infarction 08/18/2011   Bariatric surgery status 06/17/2011   Speech abnormality 03/22/2011   Dyspnea 02/24/2011   PAPILLARY MUSCLE DYSFUNCTION, NON-RHEUMATIC 10/09/2008   UNSPECIFIED VITAMIN D DEFICIENCY 10/24/2007   MYOCARDIAL INFARCTION, HX OF 10/24/2007   PERSISTENT VOMITING 10/24/2007   OSTEOARTHRITIS 10/24/2007   MIGRAINES, HX OF 10/24/2007   Diabetes mellitus type 2, controlled, with complications ([place]) 42/35/3614   Hyperlipemia 01/16/2007   Obesity-post failed open gastroplasty 1984  01/16/2007   OBSTRUCTIVE SLEEP APNEA 01/16/2007   Essential hypertension 01/16/2007   Coronary atherosclerosis 01/16/2007   Asthma 01/16/2007   GERD 01/16/2007   VENTRAL HERNIA 01/16/2007    Past Surgical History:  Procedure Laterality  Date   ANKLE FRACTURE SURGERY Left 1970's   APPENDECTOMY  1970's   w/hysterectomy   BASAL CELL CARCINOMA EXCISION Left 05/2014   "shoulder" (01/16/2018)   CARDIAC CATHETERIZATION  10/10/2012   Dr [name].   CARDIAC CATHETERIZATION N/A 05/29/2015   Procedure: Right/Left Heart Cath and Coronary/Graft Angiography;  Surgeon: Larey Dresser, MD;  Location: [place] [facility];  Service: Cardiovascular;  Laterality: N/A;   CAROTID ENDARTERECTOMY Left 03/2013   CAROTID STENT INSERTION Left 03/20/2013   Procedure: CAROTID STENT INSERTION;  Surgeon: [name], MD;  Location: [facility] CATH LAB;  Service: Cardiovascular;  Laterality: Left;  internal carotid   CEREBRAL ANGIOGRAM N/A 04/05/2011   Procedure: CEREBRAL ANGIOGRAM;  Surgeon: Angelia Mould, MD;  Location: [facility] CATH LAB;  Service: Cardiovascular;  Laterality: N/A;   CHOLECYSTECTOMY OPEN  2004   CORONARY ANGIOPLASTY WITH STENT PLACEMENT  01,02,05,06,07,08,11; 04/24/2013   "I've probably got ~ 10 stents by now" (04/24/2013)   CORONARY ANGIOPLASTY WITH STENT PLACEMENT  06/13/2013   "  got 4 stents today" (06/13/2013)   CORONARY ARTERY BYPASS GRAFT  1220/11   "CABG X5"   CORONARY STENT INTERVENTION N/A 10/20/2017   Procedure: CORONARY STENT INTERVENTION;  Surgeon: Troy Sine, MD;  Location: [place] CV LAB;  Service: Cardiovascular;  Laterality: N/A;   ESOPHAGOGASTRODUODENOSCOPY  08/03/2011   Procedure: ESOPHAGOGASTRODUODENOSCOPY (EGD);  Surgeon: Shann Medal, MD;  Location: [facility];  Service: General;  Laterality: N/A;   ESOPHAGOGASTRODUODENOSCOPY (EGD) WITH PROPOFOL N/A 03/11/2014   Procedure: ESOPHAGOGASTRODUODENOSCOPY (EGD) WITH PROPOFOL;  Surgeon: Lafayette Dragon, MD;  Location: [facility] ENDOSCOPY;  Service: Endoscopy;  Laterality: N/A;   FRACTURE SURGERY     gall stone removal  05/2003   GASTRIC RESTRICTION SURGERY  1984   "stapeling"   HERNIA REPAIR  2004   "in my stomach; had OR on it twice", wire mesh on 1 hernia    LEFT HEART CATH AND CORS/GRAFTS ANGIOGRAPHY N/A 07/09/2016   Procedure: LEFT HEART CATH AND CORS/GRAFTS ANGIOGRAPHY;  Surgeon: Larey Dresser, MD;  Location: [place] CV LAB;  Service: Cardiovascular;  Laterality: N/A;   ORIF ANKLE FRACTURE Left 05/16/2018   ORIF ANKLE FRACTURE Left 05/16/2018   Procedure: OPEN REDUCTION INTERNAL FIXATION (ORIF) Left ankle with possible syndesmosis fixation;  Surgeon: Nicholes Stairs, MD;  Location: [place];  Service: Orthopedics;  Laterality: Left;  118min   OVARY SURGERY  1970's   "tumor removed"   PERCUTANEOUS CORONARY STENT INTERVENTION (PCI-S) N/A 06/13/2013   Procedure: PERCUTANEOUS CORONARY STENT INTERVENTION (PCI-S);  Surgeon: [name], MD;  Location[facility] C[facility] LAB;  Service: Cardiovascular;  Laterality: N/A;   PERCUTANEOUS STENT INTERVENTION N/A 04/24/2013   Procedure: PERCUTANEOUS STENT INTERVENTION;  Surgeon: [name], MD;  Location[facility] C[facility] LAB;  Service: Cardiovascular;  Laterality: N/A;   RIGHT/LEFT HEART CATH AND CORONARY/GRAFT ANGIOGRAPHY N/A 10/20/2017   Procedure: RIGHT/LEFT HEART CATH AND CORONARY/GRAFT ANGIOGRAPHY;  Surgeon: Larey Dresser, MD;  Location: [place] CV LAB;  Service: Cardiovascular;  Laterality: N/A;   ROOT CANAL  10/2000   TIBIA FRACTURE SURGERY Right 1970's   rods and pins   TOOTH EXTRACTION     "1 on the upper; wisdom tooth on the lower" (01/16/2018)   TOTAL ABDOMINAL HYSTERECTOMY  1970's   w/ appendectomy     OB History   No obstetric history on file.     Family History  Problem Relation Age of Onset   Heart disease Father        Heart Disease before age 55   Diabetes Father    Hyperlipidemia Father    Hypertension Father    Heart attack Father    Deep vein thrombosis Father    AAA (abdominal aortic aneurysm) Father    Hypertension Mother    Deep vein thrombosis Mother    AAA (abdominal aortic aneurysm) Mother    Cancer Sister        Ovarian    Hypertension Sister    Diabetes Paternal Grandmother    Heart disease Paternal Uncle    Diabetes Paternal Uncle    Heart attack Paternal Grandfather 43       died of MI at 73   Diabetes Paternal Aunt    Diabetes Paternal Uncle    Colon cancer Neg Hx     Social History   Tobacco Use   Smoking status: Never Smoker   Smokeless tobacco: Never Used  Substance Use Topics   Alcohol use: Never   Drug use: Never  Home Medications Prior to Admission medications   Medication Sig Start Date End Date Taking? Authorizing Provider  acetaminophen (TYLENOL) 500 MG tablet Take 1 tablet (500 mg total) by mouth every 6 (six) hours as needed for moderate pain or fever. Patient taking differently: Take 1,000 mg by mouth every 6 (six) hours as needed for moderate pain, fever or headache.  09/28/18   Angiulli, Lavon Paganini, PA-C  albuterol (VENTOLIN HFA) 108 (90 Base) MCG/ACT inhaler Inhale 2 puffs into the lungs every 4 (four) hours as needed for wheezing or shortness of breath.     [provider]  Alirocumab (PRALUENT) 150 MG/ML SOAJ Inject 1 pen into the skin every 14 (fourteen) days. Patient taking differently: Inject 150 mg into the skin every 14 (fourteen) days.  08/18/18   Larey Dresser, MD  ALPRAZolam Duanne Moron) 0.5 MG tablet Take 1 tablet (0.5 mg total) by mouth 2 (two) times daily as needed for anxiety. Patient taking differently: Take 0.5-1 mg by mouth See admin instructions. Take one tablet (0.5 mg) by mouth every morning and two tablets (1 mg) at night, may also take 1 tablet (0.5 mg) midday as needed for anxiety 09/28/18   Angiulli, Lavon Paganini, PA-C  apixaban (ELIQUIS) 5 MG TABS tablet Take 1 tablet (5 mg total) by mouth 2 (two) times daily. 09/28/18   Angiulli, Lavon Paganini, PA-C  bisacodyl (DULCOLAX) 5 MG EC tablet Take 1 tablet (5 mg total) by mouth daily as needed for moderate constipation. 05/19/18 05/19/19  Nicholes Stairs, MD  denosumab (PROLIA) 60 MG/ML SOSY injection  Inject 60 mg into the skin every 6 (six) months.     [provider]  DULoxetine (CYMBALTA) 30 MG capsule Take 1 capsule (30 mg total) by mouth daily. 09/28/18   Angiulli, Lavon Paganini, PA-C  ezetimibe (ZETIA) 10 MG tablet Take 1 tablet (10 mg total) by mouth at bedtime. 04/10/18   Cherene Altes, MD  Fluticasone-Salmeterol (ADVAIR) 250-50 MCG/DOSE AEPB Inhale 1 puff into the lungs daily.    [provider]  gabapentin (NEURONTIN) 300 MG capsule Take 1 capsule (300 mg total) by mouth daily. Patient taking differently: Take 300-600 mg by mouth See admin instructions. Take two capsules (600 mg) by mouth every morning and one capsule (300 mg) at night; may also take one capsule (300 mg) midday as needed for nerve pain 09/28/18   Angiulli, Lavon Paganini, PA-C  HYDROcodone-acetaminophen (NORCO/VICODIN) 5-325 MG tablet Take 1 tablet by mouth at bedtime as needed for moderate pain.    [provider]  Insulin Glargine (LANTUS SOLOSTAR) 100 UNIT/ML Solostar Pen Inject 38-44 Units into the skin See admin instructions. Inject 38 units subcutaneously daily at bedtime, increase to 44 units for CBG >200    [provider]  insulin lispro (HUMALOG KWIKPEN) 100 UNIT/ML KwikPen Inject 14-18 Units into the skin See admin instructions. Inject 14 units subcutaneously prior to breakfast and supper; add 4 units for CBG >200    [provider]  ipratropium-albuterol (DUONEB) 0.5-2.5 (3) MG/3ML SOLN Take 3 mLs by nebulization 2 (two) times daily. Patient taking differently: Take 3 mLs by nebulization 2 (two) times daily as needed (shortness of breath).  04/10/18   Cherene Altes, MD  losartan (COZAAR) 25 MG tablet Take 0.5 tablets (12.5 mg total) by mouth daily. 01/11/19   Larey Dresser, MD  metolazone (ZAROXOLYN) 2.5 MG tablet Take 1 tablet (2.5 mg total) by mouth daily as needed (As directed by heart failure clinic.  Call heart failure office for direction when experiencing overnight  weight gain). 12/26/18   Geradine Girt, DO  metoprolol succinate (TOPROL-XL) 50 MG 24 hr tablet Take 1 tablet (50 mg total) by mouth daily. Take with or immediately following a meal. 09/28/18   Angiulli, Lavon Paganini, PA-C  Multiple Vitamin (MULTIVITAMIN WITH MINERALS) TABS tablet Take 1 tablet by mouth every morning. Centrum - Women over 93    [provider]  Multiple Vitamins-Minerals ([place]) CAPS Take 1 capsule by mouth every morning.    [provider]  nitroGLYCERIN (NITROSTAT) 0.3 MG SL tablet Place 1 tablet (0.3 mg total) under the tongue every 5 (five) minutes x 3 doses as needed for chest pain. 09/15/17   Georgiana Shore, NP  ondansetron (ZOFRAN-ODT) 4 MG disintegrating tablet Take 4 mg by mouth 2 (two) times daily as needed for nausea or vomiting.    [provider]  OXYGEN Inhale 2 L into the lungs at bedtime as needed (shortness of breath).     [provider]  pantoprazole (PROTONIX) 40 MG tablet Take 1 tablet (40 mg total) by mouth daily. 09/28/18   Angiulli, Lavon Paganini, PA-C  Polyvinyl Alcohol-Povidone (REFRESH OP) Place 1 drop into both eyes daily as needed (redness/ dry eyes).    [provider]  potassium chloride SA (KLOR-CON) 20 MEQ tablet Take 2 tablets by mouth every morning and 1 tablet by mouth every evening Patient taking differently: Take 1 tablets by mouth every morning and 1 tablet by mouth every evening 01/11/19   Larey Dresser, MD  Pyridoxine HCl (VITAMIN B-6 PO) Take 1 tablet by mouth every morning.    [provider]  ranolazine (RANEXA) 500 MG 12 hr tablet Take 1 tablet (500 mg total) by mouth 2 (two) times daily. 09/28/18   Angiulli, Lavon Paganini, PA-C  rosuvastatin (CRESTOR) 40 MG tablet Take 1 tablet (40 mg total) by mouth at bedtime. 09/28/18   Angiulli, Lavon Paganini, PA-C  spironolactone (ALDACTONE) 25 MG tablet Take 1 tablet (25 mg total) by mouth daily. 11/13/18   Larey Dresser, MD  torsemide  (DEMADEX) 20 MG tablet Take 4 tablets (80 mg) by mouth every morning, take 2 tablets (40 mg) every evening 01/11/19   Larey Dresser, MD  traZODone (DESYREL) 50 MG tablet Take 1 tablet (50 mg total) by mouth at bedtime. 01/12/19   Kathrynn Ducking, MD  triamcinolone cream (KENALOG) 0.1 % Apply 1 application topically daily as needed (crusty spots on arms).    [provider]  vitamin B-12 (CYANOCOBALAMIN) 1000 MCG tablet Take 1,000 mcg by mouth every morning.     [provider]  zolpidem (AMBIEN) 10 MG tablet Take 0.5 tablets (5 mg total) by mouth at bedtime. 12/26/18   Geradine Girt, DO    Allergies    Amoxicillin, Brilinta [ticagrelor], Erythromycin, Flagyl [metronidazole], Penicillins, Isosorbide mononitrate [isosorbide nitrate], Jardiance [empagliflozin], Metformin and related, Tape, and Erythromycin base  Review of Systems   Review of Systems  Constitutional: Negative for fever.  Respiratory: Positive for shortness of breath.        Chronic for patient  Gastrointestinal: Negative for abdominal pain.  Genitourinary: Negative for dysuria.  Neurological: Positive for dizziness, speech difficulty and light-headedness. Negative for headaches.  All other systems reviewed and are negative.   Physical Exam Updated Vital Signs BP (!) 104/50    Pulse (!) 33    Temp 97.7 F (36.5 C) (Oral)  Resp (!) 25    SpO2 96%   Physical Exam Vitals and nursing note reviewed.  Constitutional:      General: She is not in acute distress.    Appearance: She is well-developed.  HENT:     Head: Normocephalic and atraumatic.     Right Ear: External ear normal.     Left Ear: External ear normal.  Eyes:     General: No scleral icterus.       Right eye: No discharge.        Left eye: No discharge.     Conjunctiva/sclera: Conjunctivae normal.  Neck:     Trachea: No tracheal deviation.  Cardiovascular:     Rate and Rhythm: Normal rate and regular rhythm.  Pulmonary:      Effort: Pulmonary effort is normal. No respiratory distress.     Breath sounds: Normal breath sounds. No stridor. No wheezing or rales.  Abdominal:     General: Bowel sounds are normal. There is no distension.     Palpations: Abdomen is soft.     Tenderness: There is no abdominal tenderness. There is no guarding or rebound.  Musculoskeletal:        General: No tenderness.     Cervical back: Neck supple.  Skin:    General: Skin is warm and dry.     Findings: No rash.  Neurological:     Mental Status: She is alert and oriented to person, place, and time.     Cranial Nerves: No cranial nerve deficit (No facial droop, extraocular movements intact, tongue midline, speech clear but intermittent stutter ).     Sensory: No sensory deficit.     Motor: No abnormal muscle tone or seizure activity.     Coordination: Coordination normal.     Comments: No pronator drift bilateral upper extrem, unable to hold both legs off bed for 5 seconds, sensation intact in all extremities, no visual field cuts, no left or right sided neglect, normal finger-nose exam bilaterally, no nystagmus noted Answers year and age correctly NIHSS 2 (bilateral lower leg drift)     ED Results / Procedures / Treatments   Labs (all labs ordered are listed, but only abnormal results are displayed) Labs Reviewed  CBC - Abnormal; Notable for the following components:      Result Value   WBC 10.8 (*)    Hemoglobin 9.9 (*)    HCT 34.5 (*)    MCH 24.5 (*)    MCHC 28.7 (*)    RDW 18.9 (*)    All other components within normal limits  BASIC METABOLIC PANEL - Abnormal; Notable for the following components:   Potassium 5.2 (*)    Chloride 97 (*)    Glucose, Bld 141 (*)    BUN 40 (*)    Creatinine, Ser 2.28 (*)    GFR calc non Af Amer 21 (*)    GFR calc Af Amer 24 (*)    All other components within normal limits  TROPONIN I (HIGH SENSITIVITY) - Abnormal; Notable for the following components:   Troponin I (High Sensitivity)  25 (*)    All other components within normal limits  TROPONIN I (HIGH SENSITIVITY)    EKG EKG Interpretation  Date/Time:  Tuesday January 16 2019 16:23:48 EST Ventricular Rate:  70 PR Interval:    QRS Duration: 200 QT Interval:  474 QTC Calculation: 486 R Axis:   31 Text Interpretation: Sinus rhythm Atrial premature complexes Sinus pause IVCD, consider atypical  RBBB LVH with secondary repolarization abnormality ST depr, consider ischemia, inferior leads, new since last tracing Borderline prolonged QT interval Confirmed by Dorie Rank [phone]) on 01/16/2019 4:44:51 PM   Radiology DG Chest Portable 1 View  Result Date: 01/16/2019 CLINICAL DATA:  Weakness. EXAM: PORTABLE CHEST 1 VIEW COMPARISON:  09/18/2018 FINDINGS: Stable enlargement of the cardiopericardial silhouette. Stable changes from prior CABG surgery. No mediastinal or hilar masses. Clear lungs.  No pleural effusion or pneumothorax. Skeletal structures are grossly intact. IMPRESSION: 1. No acute cardiopulmonary disease. 2. Stable cardiomegaly. Electronically Signed   By: Lajean Manes M.D.   On: 01/16/2019 17:19    Procedures .Critical Care Performed by: Dorie Rank, MD Authorized by: Dorie Rank, MD   Critical care provider statement:    Critical care time (minutes):  45   Critical care was time spent personally by me on the following activities:  Discussions with consultants, evaluation of patient's response to treatment, examination of patient, ordering and performing treatments and interventions, ordering and review of laboratory studies, ordering and review of radiographic studies, pulse oximetry, re-evaluation of patient's condition, obtaining history from patient or surrogate and review of old charts   (including critical care time)  Medications Ordered in ED Medications  sodium chloride 0.9 % bolus 500 mL (0 mLs Intravenous Stopped 01/16/19 1753)    Followed by  0.9 %  sodium chloride infusion (1,000 mLs  Intravenous New Bag/Given 01/16/19 1705)  atropine 1 MG/10ML injection 0.5 mg (0.5 mg Intravenous Given 01/16/19 1800)    ED Course  I have reviewed the triage vital signs and the nursing notes.  Pertinent labs & imaging results that were available during my care of the patient were reviewed by me and considered in my medical decision making (see chart for details).  Clinical Course as of Jan 15 1900  Tue Jan 16, 2019  1750 Heart rate was in the 50s on my initial eval.  Back in the 30s now.   Will give small dose atropine   [JK]  1752 Hgb is stable   [JK]  1814 Labs notable for a bump in BUN and creatinine.  High-sensitivity troponin slightly elevated.   [TS]  1779 Patient has had persistent bradycardia.  Her blood pressures have been borderline.  I will consult with cardiology.   [JK]  1857 HR improved after dose of atropine.  At the bedside heart rate in the 50s on the monitor   [JK]    Clinical Course User Index [JK] Dorie Rank, MD   MDM Rules/Calculators/A&P                      Patient presented to ED with complaints of weakness.  Patient has had atrial fibrillation but was notably bradycardic and blood pressure was borderline.  Patient is on metoprolol.  No signs of acute infection.  No signs of GI bleed.  Bradycardia may be related to her medications.  I will consult with cardiology in anticipation of the admission for observation.  No acute indications for pacing at this time. Final Clinical Impression(s) / ED Diagnoses Final diagnoses:  Bradycardia  Weakness      Dorie Rank, MD 01/16/19 1903

## 2019-01-16 NOTE — H&P (Signed)
History and Physical    [name] [name] KGM:[id number] DOB: 09/12/1945 DOA: 01/16/2019  PCP: Derinda Late, MD   Patient coming from: Home   Chief Complaint: Fatigue, low HR and low BP   HPI: [name] [name] is a 73 y.o. female with medical history significant for coronary artery disease, chronic kidney disease stage III, atrial fibrillation on Eliquis, depression, anxiety, insulin-dependent diabetes mellitus, and chronic systolic CHF, now presenting to the emergency department for evaluation of fatigue, near syncope, and abnormal vitals at home with heart rate in the 30s and SBP 70s. Patient reports some vague chest pressure over the past 1 to 2 months but is unable to identify any alleviating or exacerbating factors for this. She reports that her legs are usually swollen, but this has improved since recent increase in her diuretics. She has not been particularly short of breath and denies any recent fevers or chills. She feels generally weak and fatigued, developing insidiously, and has had some near syncope with falls, but no loss of consciousness. She denies any focal numbness or weakness. She found her heart rate to be in the 64Q and systolic blood pressure in the 70s today, noting that the heart rate is usually in the 50s.  ED Course: Upon arrival to the ED, patient is found to be afebrile, saturating mid 90s on room air, heart rate dipping into the mid 30s, and with blood pressure 98/48. EKG features a sinus rhythm with PACs, sinus pause, IVCD, and LVH with repolarization abnormality. Chest x-ray is negative for acute cardiopulmonary disease. Chemistry panel notable for creatinine of 2.28, up from 1.46 a few days earlier. CBC is features a stable normocytic anemia. High-sensitivity troponin is 25. Patient was given 0.5 mg of atropine in the ED and 500 cc normal saline. Cardiology was consulted by the ED physician and medical admission was recommended.  Review of Systems:  All other systems  reviewed and apart from HPI, are negative.  Past Medical History:  Diagnosis Date  . Anemia    hx  . Anxiety   . Asthma   . Basal cell carcinoma 05/2014   "left shoulder"  . Bundle branch block, left    chronic/notes 07/18/2013  . CHF (congestive heart failure) ([place])   . Chronic insomnia 05/06/2015  . Chronic kidney disease    frequency, sees dr Jamal Maes every 4 to 6 months (01/16/2018)  . Chronic lower back pain   . Claustrophobia   . Common migraine 05/14/2014  . Coronary artery disease    MI in 2001, 2002, 2006, 2011, 2014  . Depression   . Diabetic peripheral neuropathy ([place]) 01/12/2019  . GERD (gastroesophageal reflux disease)   . H/O hiatal hernia   . Headache    "at least 2/month" (01/16/2018)  . Heart murmur   . Hyperlipidemia   . Hypertension   . Memory change 05/14/2014  . Migraine    "5-6/year"  (01/16/2018)  . Obesity 01-2010  . Obstructive sleep apnea    "can't wear machine; I have claustrophobia" (01/16/2018), states she had a 2nd sleep study and does not have sleep apnea, her O2 decreases and now is on 2 L of O2 at night.  . On home oxygen therapy    "2L at night and prn during daytime" (01/16/2018)  . Osteoarthritis    "knees and hands" (01/16/2018)  . Peripheral vascular disease (HCC)    ? numbness, tingling arms and legs  . PONV (postoperative nausea and vomiting)   . Stroke (  [place])    2014, 2015, 2016  . Type II diabetes mellitus ([place])   . Ventral hernia    hx of    Past Surgical History:  Procedure Laterality Date  . ANKLE FRACTURE SURGERY Left 1970's  . APPENDECTOMY  1970's   w/hysterectomy  . BASAL CELL CARCINOMA EXCISION Left 05/2014   "shoulder" (01/16/2018)  . CARDIAC CATHETERIZATION  10/10/2012   Dr [name].  Marland Kitchen CARDIAC CATHETERIZATION N/A 05/29/2015   Procedure: Right/Left Heart Cath and Coronary/Graft Angiography;  Surgeon: Larey Dresser, MD;  Location: [place] [facility];  Service: Cardiovascular;  Laterality: N/A;  . CAROTID  ENDARTERECTOMY Left 03/2013  . CAROTID STENT INSERTION Left 03/20/2013   Procedure: CAROTID STENT INSERTION;  Surgeon: [name], MD;  Location: [facility] CATH LAB;  Service: Cardiovascular;  Laterality: Left;  internal carotid  . CEREBRAL ANGIOGRAM N/A 04/05/2011   Procedure: CEREBRAL ANGIOGRAM;  Surgeon: Angelia Mould, MD;  Location: [facility] CATH LAB;  Service: Cardiovascular;  Laterality: N/A;  . CHOLECYSTECTOMY OPEN  2004  . CORONARY ANGIOPLASTY WITH STENT PLACEMENT  01,02,05,06,07,08,11; 04/24/2013   "I've probably got ~ 10 stents by now" (04/24/2013)  . CORONARY ANGIOPLASTY WITH STENT PLACEMENT  06/13/2013   "got 4 stents today" (06/13/2013)  . CORONARY ARTERY BYPASS GRAFT  1220/11   "CABG X5"  . CORONARY STENT INTERVENTION N/A 10/20/2017   Procedure: CORONARY STENT INTERVENTION;  Surgeon: Troy Sine, MD;  Location: [place] [facility];  Service: Cardiovascular;  Laterality: N/A;  . ESOPHAGOGASTRODUODENOSCOPY  08/03/2011   Procedure: ESOPHAGOGASTRODUODENOSCOPY (EGD);  Surgeon: Shann Medal, MD;  Location: [facility];  Service: General;  Laterality: N/A;  . ESOPHAGOGASTRODUODENOSCOPY (EGD) WITH PROPOFOL N/A 03/11/2014   Procedure: ESOPHAGOGASTRODUODENOSCOPY (EGD) WITH PROPOFOL;  Surgeon: Lafayette Dragon, MD;  Location: [facility];  Service: Endoscopy;  Laterality: N/A;  . FRACTURE SURGERY    . gall stone removal  05/2003  . GASTRIC RESTRICTION SURGERY  1984   "stapeling"  . HERNIA REPAIR  2004   "in my stomach; had OR on it twice", wire mesh on 1 hernia  . LEFT HEART CATH AND CORS/GRAFTS ANGIOGRAPHY N/A 07/09/2016   Procedure: LEFT HEART CATH AND CORS/GRAFTS ANGIOGRAPHY;  Surgeon: Larey Dresser, MD;  Location: [place] [facility];  Service: Cardiovascular;  Laterality: N/A;  . ORIF ANKLE FRACTURE Left 05/16/2018  . ORIF ANKLE FRACTURE Left 05/16/2018   Procedure: OPEN REDUCTION INTERNAL FIXATION (ORIF) Left ankle with possible syndesmosis fixation;  Surgeon: Nicholes Stairs, MD;   Location: [place];  Service: Orthopedics;  Laterality: Left;  148min  . OVARY SURGERY  1970's   "tumor removed"  . PERCUTANEOUS CORONARY STENT INTERVENTION (PCI-S) N/A 06/13/2013   Procedure: PERCUTANEOUS CORONARY STENT INTERVENTION (PCI-S);  Surgeon: [name], MD;  Location: [facility] CATH LAB;  Service: Cardiovascular;  Laterality: N/A;  . PERCUTANEOUS STENT INTERVENTION N/A 04/24/2013   Procedure: PERCUTANEOUS STENT INTERVENTION;  Surgeon: [name], MD;  Location: [facility] CATH LAB;  Service: Cardiovascular;  Laterality: N/A;  . RIGHT/LEFT HEART CATH AND CORONARY/GRAFT ANGIOGRAPHY N/A 10/20/2017   Procedure: RIGHT/LEFT HEART CATH AND CORONARY/GRAFT ANGIOGRAPHY;  Surgeon: Larey Dresser, MD;  Location: [place] [facility];  Service: Cardiovascular;  Laterality: N/A;  . ROOT CANAL  10/2000  . TIBIA FRACTURE SURGERY Right 1970's   rods and pins  . TOOTH EXTRACTION     "1 on the upper; wisdom tooth on the lower" (01/16/2018)  . TOTAL ABDOMINAL HYSTERECTOMY  1970's   w/  appendectomy     reports that she has never smoked. She has never used smokeless tobacco. She reports that she does not drink alcohol or use drugs.  Allergies  Allergen Reactions  . Amoxicillin Shortness Of Breath and Rash  . Brilinta [Ticagrelor] Shortness Of Breath  . Erythromycin Shortness Of Breath, Other (See Comments) and Hives    Trouble swallowing  . Flagyl [Metronidazole] Shortness Of Breath and Palpitations  . Penicillins Hives, Shortness Of Breath, Rash and Other (See Comments)    Has patient had a PCN reaction causing immediate rash, facial/tongue/throat swelling, SOB or lightheadedness with hypotension: Yes Has patient had a PCN reaction causing severe rash involving mucus membranes or skin necrosis: No Has patient had a PCN reaction that required hospitalization: Yes Has patient had a PCN reaction occurring within the last 10 years: No If all of the above answers are "NO", then may proceed with  Cephalosporin use.   . Isosorbide Mononitrate [Isosorbide Nitrate] Other (See Comments)    Joint aches, muscles hurt, difficult to walk   . Jardiance [Empagliflozin] Other (See Comments)    Nausea, joint aches, muscles aches  . Metformin And Related Other (See Comments)    Stomach pain, cold sweats, joint pain, burred vision, dizziness  . Tape Other (See Comments)    Skin pulls off with certain types Plastic tape causes skin to rip if left on for long periods of time  . Erythromycin Base Rash    Family History  Problem Relation Age of Onset  . Heart disease Father        Heart Disease before age 66  . Diabetes Father   . Hyperlipidemia Father   . Hypertension Father   . Heart attack Father   . Deep vein thrombosis Father   . AAA (abdominal aortic aneurysm) Father   . Hypertension Mother   . Deep vein thrombosis Mother   . AAA (abdominal aortic aneurysm) Mother   . Cancer Sister        Ovarian  . Hypertension Sister   . Diabetes Paternal Grandmother   . Heart disease Paternal Uncle   . Diabetes Paternal Uncle   . Heart attack Paternal Grandfather 92       died of MI at 108  . Diabetes Paternal Aunt   . Diabetes Paternal Uncle   . Colon cancer Neg Hx      Prior to Admission medications   Medication Sig Start Date End Date Taking? Authorizing Provider  acetaminophen (TYLENOL) 500 MG tablet Take 1 tablet (500 mg total) by mouth every 6 (six) hours as needed for moderate pain or fever. Patient taking differently: Take 1,000 mg by mouth every 6 (six) hours as needed for moderate pain, fever or headache.  09/28/18   Angiulli, Lavon Paganini, PA-C  albuterol (VENTOLIN HFA) 108 (90 Base) MCG/ACT inhaler Inhale 2 puffs into the lungs every 4 (four) hours as needed for wheezing or shortness of breath.     [provider]  Alirocumab (PRALUENT) 150 MG/ML SOAJ Inject 1 pen into the skin every 14 (fourteen) days. Patient taking differently: Inject 150 mg into the skin every 14  (fourteen) days.  08/18/18   Larey Dresser, MD  ALPRAZolam Duanne Moron) 0.5 MG tablet Take 1 tablet (0.5 mg total) by mouth 2 (two) times daily as needed for anxiety. Patient taking differently: Take 0.5-1 mg by mouth See admin instructions. Take one tablet (0.5 mg) by mouth every morning and two tablets (1 mg) at night, may  also take 1 tablet (0.5 mg) midday as needed for anxiety 09/28/18   Angiulli, Lavon Paganini, PA-C  apixaban (ELIQUIS) 5 MG TABS tablet Take 1 tablet (5 mg total) by mouth 2 (two) times daily. 09/28/18   Angiulli, Lavon Paganini, PA-C  bisacodyl (DULCOLAX) 5 MG EC tablet Take 1 tablet (5 mg total) by mouth daily as needed for moderate constipation. 05/19/18 05/19/19  Nicholes Stairs, MD  denosumab (PROLIA) 60 MG/ML SOSY injection Inject 60 mg into the skin every 6 (six) months.     [provider]  DULoxetine (CYMBALTA) 30 MG capsule Take 1 capsule (30 mg total) by mouth daily. 09/28/18   Angiulli, Lavon Paganini, PA-C  ezetimibe (ZETIA) 10 MG tablet Take 1 tablet (10 mg total) by mouth at bedtime. 04/10/18   Cherene Altes, MD  Fluticasone-Salmeterol (ADVAIR) 250-50 MCG/DOSE AEPB Inhale 1 puff into the lungs daily.    [provider]  gabapentin (NEURONTIN) 300 MG capsule Take 1 capsule (300 mg total) by mouth daily. Patient taking differently: Take 300-600 mg by mouth See admin instructions. Take two capsules (600 mg) by mouth every morning and one capsule (300 mg) at night; may also take one capsule (300 mg) midday as needed for nerve pain 09/28/18   Angiulli, Lavon Paganini, PA-C  HYDROcodone-acetaminophen (NORCO/VICODIN) 5-325 MG tablet Take 1 tablet by mouth at bedtime as needed for moderate pain.    [provider]  Insulin Glargine (LANTUS SOLOSTAR) 100 UNIT/ML Solostar Pen Inject 38-44 Units into the skin See admin instructions. Inject 38 units subcutaneously daily at bedtime, increase to 44 units for CBG >200    [provider]  insulin lispro (HUMALOG KWIKPEN)  100 UNIT/ML KwikPen Inject 14-18 Units into the skin See admin instructions. Inject 14 units subcutaneously prior to breakfast and supper; add 4 units for CBG >200    [provider]  ipratropium-albuterol (DUONEB) 0.5-2.5 (3) MG/3ML SOLN Take 3 mLs by nebulization 2 (two) times daily. Patient taking differently: Take 3 mLs by nebulization 2 (two) times daily as needed (shortness of breath).  04/10/18   Cherene Altes, MD  losartan (COZAAR) 25 MG tablet Take 0.5 tablets (12.5 mg total) by mouth daily. 01/11/19   Larey Dresser, MD  metolazone (ZAROXOLYN) 2.5 MG tablet Take 1 tablet (2.5 mg total) by mouth daily as needed (As directed by heart failure clinic. Call heart failure office for direction when experiencing overnight weight gain). 12/26/18   Geradine Girt, DO  metoprolol succinate (TOPROL-XL) 50 MG 24 hr tablet Take 1 tablet (50 mg total) by mouth daily. Take with or immediately following a meal. 09/28/18   Angiulli, Lavon Paganini, PA-C  Multiple Vitamin (MULTIVITAMIN WITH MINERALS) TABS tablet Take 1 tablet by mouth every morning. Centrum - Women over 9    [provider]  Multiple Vitamins-Minerals ([place]) CAPS Take 1 capsule by mouth every morning.    [provider]  nitroGLYCERIN (NITROSTAT) 0.3 MG SL tablet Place 1 tablet (0.3 mg total) under the tongue every 5 (five) minutes x 3 doses as needed for chest pain. 09/15/17   Georgiana Shore, NP  ondansetron (ZOFRAN-ODT) 4 MG disintegrating tablet Take 4 mg by mouth 2 (two) times daily as needed for nausea or vomiting.    [provider]  OXYGEN Inhale 2 L into the lungs at bedtime as needed (shortness of breath).     [provider]  pantoprazole (PROTONIX) 40 MG tablet Take 1 tablet (  40 mg total) by mouth daily. 09/28/18   Angiulli, Lavon Paganini, PA-C  Polyvinyl Alcohol-Povidone (REFRESH OP) Place 1 drop into both eyes daily as needed (redness/ dry eyes).    [provider]   potassium chloride SA (KLOR-CON) 20 MEQ tablet Take 2 tablets by mouth every morning and 1 tablet by mouth every evening Patient taking differently: Take 1 tablets by mouth every morning and 1 tablet by mouth every evening 01/11/19   Larey Dresser, MD  Pyridoxine HCl (VITAMIN B-6 PO) Take 1 tablet by mouth every morning.    [provider]  ranolazine (RANEXA) 500 MG 12 hr tablet Take 1 tablet (500 mg total) by mouth 2 (two) times daily. 09/28/18   Angiulli, Lavon Paganini, PA-C  rosuvastatin (CRESTOR) 40 MG tablet Take 1 tablet (40 mg total) by mouth at bedtime. 09/28/18   Angiulli, Lavon Paganini, PA-C  spironolactone (ALDACTONE) 25 MG tablet Take 1 tablet (25 mg total) by mouth daily. 11/13/18   Larey Dresser, MD  torsemide (DEMADEX) 20 MG tablet Take 4 tablets (80 mg) by mouth every morning, take 2 tablets (40 mg) every evening 01/11/19   Larey Dresser, MD  traZODone (DESYREL) 50 MG tablet Take 1 tablet (50 mg total) by mouth at bedtime. 01/12/19   Kathrynn Ducking, MD  triamcinolone cream (KENALOG) 0.1 % Apply 1 application topically daily as needed (crusty spots on arms).    [provider]  vitamin B-12 (CYANOCOBALAMIN) 1000 MCG tablet Take 1,000 mcg by mouth every morning.     [provider]  zolpidem (AMBIEN) 10 MG tablet Take 0.5 tablets (5 mg total) by mouth at bedtime. 12/26/18   Geradine Girt, DO    Physical Exam: Vitals:   01/16/19 1800 01/16/19 1815 01/16/19 1830 01/16/19 1845  BP: (!) 104/53 (!) 106/44 (!) 104/50 125/87  Pulse:  (!) 40 (!) 33 (!) 37  Resp:  16 (!) 25 19  Temp:      TempSrc:      SpO2:  94% 96% 95%    Constitutional: NAD, calm  Eyes: PERTLA, lids and conjunctivae normal ENMT: Mucous membranes are moist. Posterior pharynx clear of any exudate or lesions.   Neck: normal, supple, no masses, no thyromegaly Respiratory: no wheezing, no crackles. Normal respiratory effort. No accessory muscle use.  Cardiovascular: Rate ~45 and  regular. No extremity edema.   Abdomen: No distension, no tenderness, soft. Bowel sounds active.  Musculoskeletal: no clubbing / cyanosis. No joint deformity upper and lower extremities.  Skin: no significant rashes, lesions, ulcers. Warm, dry, well-perfused. Neurologic: No facial asymmetry. Sensation intact. Moving all extremities.  Psychiatric: Alert and oriented to person, place, and situation. Very pleasant, cooperative.     Labs on Admission: I have personally reviewed following labs and imaging studies  CBC: Recent Labs  Lab 01/16/19 1645  WBC 10.8*  HGB 9.9*  HCT 34.5*  MCV 85.4  PLT 161   Basic Metabolic Panel: Recent Labs  Lab 01/11/19 1625 01/16/19 1645  NA 140 136  K 3.5 5.2*  CL 95* 97*  CO2 30 24  GLUCOSE 125* 141*  BUN 19 40*  CREATININE 1.46* 2.28*  CALCIUM 9.6 9.0   GFR: Estimated Creatinine Clearance: 25.5 mL/min (A) (by C-G formula based on SCr of 2.28 mg/dL (H)). Liver Function Tests: No results for input(s): AST, ALT, ALKPHOS, BILITOT, PROT, ALBUMIN in the last 168 hours. No results for input(s): LIPASE, AMYLASE in the last 168 hours. No results for  input(s): AMMONIA in the last 168 hours. Coagulation Profile: No results for input(s): INR, PROTIME in the last 168 hours. Cardiac Enzymes: No results for input(s): CKTOTAL, CKMB, CKMBINDEX, TROPONINI in the last 168 hours. BNP (last 3 results) No results for input(s): PROBNP in the last 8760 hours. HbA1C: No results for input(s): HGBA1C in the last 72 hours. CBG: No results for input(s): GLUCAP in the last 168 hours. Lipid Profile: No results for input(s): CHOL, HDL, LDLCALC, TRIG, CHOLHDL, LDLDIRECT in the last 72 hours. Thyroid Function Tests: No results for input(s): TSH, T4TOTAL, FREET4, T3FREE, THYROIDAB in the last 72 hours. Anemia Panel: No results for input(s): VITAMINB12, FOLATE, FERRITIN, TIBC, IRON, RETICCTPCT in the last 72 hours. Urine analysis:    Component Value Date/Time    COLORURINE AMBER (A) 09/18/2018 1309   APPEARANCEUR HAZY (A) 09/18/2018 1309   LABSPEC 1.018 09/18/2018 1309   PHURINE 5.0 09/18/2018 1309   GLUCOSEU NEGATIVE 09/18/2018 1309   HGBUR NEGATIVE 09/18/2018 1309   BILIRUBINUR NEGATIVE 09/18/2018 1309   KETONESUR NEGATIVE 09/18/2018 1309   PROTEINUR 100 (A) 09/18/2018 1309   UROBILINOGEN 1.0 04/16/2011 0954   NITRITE NEGATIVE 09/18/2018 1309   LEUKOCYTESUR NEGATIVE 09/18/2018 1309   Sepsis Labs: @LABRCNTIP (procalcitonin:4,lacticidven:4) ) Recent Results (from the past 240 hour(s))  Novel Coronavirus, NAA (Hosp order, Send-out to [facility]; TAT 18-24 hrs     Status: None   Collection Time: 01/15/19  1:00 PM   Specimen: Nasopharyngeal Swab; Respiratory  Result Value Ref Range Status   SARS-CoV-2, NAA NOT DETECTED NOT DETECTED Final    Comment: (NOTE) This nucleic acid amplification test was developed and its performance characteristics determined by [employer]. Nucleic acid amplification tests include PCR and TMA. This test has not been FDA cleared or approved. This test has been authorized by FDA under an Emergency Use Authorization (EUA). This test is only authorized for the duration of time the declaration that circumstances exist justifying the authorization of the emergency use of in vitro diagnostic tests for detection of SARS-CoV-2 virus and/or diagnosis of COVID-19 infection under section 564(b)(1) of the Act, 21 U.S.C. 397QBH-4(L) (1), unless the authorization is terminated or revoked sooner. When diagnostic testing is negative, the possibility of a false negative result should be considered in the context of a patient's recent exposures and the presence of clinical signs and symptoms consistent with COVID-19. An individual without symptoms of COVID- 19 and who is not shedding SARS-CoV-2 vi rus would expect to have a negative (not detected) result in this assay. Performed At: [facility] [address]  [place], [place] [id number] Rush Farmer MD BD:[id number]    [place]  Final    Comment: Performed at [facility], [place] [address] [place], [place] [address]     Radiological Exams on Admission: DG Chest Portable 1 View  Result Date: 01/16/2019 CLINICAL DATA:  Weakness. EXAM: PORTABLE CHEST 1 VIEW COMPARISON:  09/18/2018 FINDINGS: Stable enlargement of the cardiopericardial silhouette. Stable changes from prior CABG surgery. No mediastinal or hilar masses. Clear lungs.  No pleural effusion or pneumothorax. Skeletal structures are grossly intact. IMPRESSION: 1. No acute cardiopulmonary disease. 2. Stable cardiomegaly. Electronically Signed   By: Lajean Manes M.D.   On: 01/16/2019 17:19    EKG: Independently reviewed. Sinus rhythm, PAC's, sinus pause, IVCD, LVH with repolarization abnormality.   Assessment/Plan   1. Symptomatic bradycardia  - Presents with fatigue, near-syncope, and HR 30's with SBP 70's at home  - She received a  dose of atropine in ED  - Cardiology is consulting and much appreciated  - Follow-up cardiology recommendations, continue cardiac monitoring and hold beta-blocker for now    2. Acute kidney injury superimposed on CKD III  - SCr is 2.28 on admission, up from 1.46 a few days earlier  - Likely prerenal azotemia in setting of increased diuretics and hypotension, also on ARB  - She was given ~1 liter of IVF in ED  - Plan to [facility] for now, hold diuretics and ARB, check FEUrea, renally-dose medications, repeat chem panel in am   3. Atrial fibrillation  - In sinus rhythm on admission  - CHADS-VASc is 26 (age, CVA x2, gender, DM, CAD)  - Continue Eliquis   4. CAD - She reports some vague chest pressure over the past 1-2 months without alleviating or exacerbating factors  - Initial HS troponin is 25 and EKG with sinus brady, IVCD, LVH with repolarization abnormality - Check delta troponin, continue statin, hold ARB in light of AKI,  hold beta-blocker as above   5. Chronic systolic CHF  - Appears compensated  - Echo from November 2020 with EF 45-50%, severe LAE and RAE, moderate-sev MR, moderate TR  - Hold diuretic and ARB initially in light of AKI, and hold beta-blocker in setting of symptomatic bradycardia   6. Depression, anxiety  - Plan to continue home medications pending pharmacy med-rec    7. Insulin-dependent DM  - A1c was 7.0% in August 2020  - Check CBG's, continue Lantus and correctional insulin    8. Normocytic anemia  - Counts appear stable, patient denies bleeding    DVT prophylaxis: Eliquis Code Status: Full  Family Communication: Husband updated at bedside Consults called: Cardiology  Admission status: Observation     Vianne Bulls, MD [facility] Pager [phone]  If 7PM-7AM, please contact night-coverage [email or web address] Password [facility]  01/16/2019, 8:57 PM

## 2019-01-16 NOTE — ED Triage Notes (Signed)
Pt reports she checked her BP at home and it was low (41H systolic). Pt c.o feeling very fatigued. Pt also bradycardic (30s). Pt a.o at this time, skin cool.

## 2019-01-16 NOTE — Consult Note (Signed)
Cardiology Consultation:   Patient ID: [name] [name]; [id number]; 11/03/1945   Admit date: 01/16/2019 Date of Consult: 01/16/2019  Primary Care Provider: Derinda Late, MD Primary Cardiologist: Larae Grooms, MD  Primary Electrophysiologist:  None  Advanced Heart Failure:  [name] Champagne, MD 01/11/2019   Patient Profile:   [name] [name] is a 73 y.o. female with a hx of [name] [name] is a 73 y.o. female who has a history of CAD s/p CABG, diastolic CHF, and cerebrovascular disease with history of CVA presents for followup of CHF and diastolic CHF. She had CABG x 5 in 12/11. Prior to the CABG she had multiple PCIs. She had a CVA in 2/14 that presented as visual loss. She had a left carotid stent in 2/15.  who is being seen today for the evaluation of hypotension and elevated Cr, at the request of Dr Tomi Bamberger - ED.  History of Present Illness:   [name] [name] had a Left heart cath done in Sept. 2014. This showed patent SVG-D, LIMA-LAD, and sequential SVG-OM branches but SVG-RCA and the native RCA were both totally occluded. It was felt that her increased symptoms coincided with occlusion of SVG-RCA. She was started on Imdur to see if this would help with the chest pain and dyspnea. However, she feels like Imdur causes leg cramps and does not think that she can take it. So ranolazine 500 mg bid started and titrated this up to 1000 mg bid. This helped some but not markedly. Therefore, sent to Dr. Irish Lack to address opening RCA CTO. He was able to do this in 5/15 with 4 overlapping Xience DES. This led to resolution of exertional chest pain.   [name] [name] was started on Brilinta after PCI. She became much more short of breath after starting on Brilinta. Per Dr Delano Metz stopped and replaced with Plavix. Dyspnea improved off Brilinta. Given increasing exertional dyspnea and chest pain, RHC/LHC was done in 4/17. This showed stable CAD with no interventional target. Left and right  heart filling pressures were not significantly increased. Medical management.   She had an MRI/MRA in May 2018 that showed moderately severe stenosis of the supraclinoid segment of the right internal carotid artery, progressed since the 2016 MR angiogram and severe basilar artery stenosis.   She developed recurrent exertional chest pain and had LHC in 6/18, showing 4/5 grafts patent with patent native RCA (similar to prior cath, no changes).   Echo 1/19 with EF 55-60%, moderate diastolic dysfunction, PASP 50 mmHg, mildly dilated RV, mild AS, mild-moderate MR.   She reported increased dyspnea and chest pain and had RHC/LHC in 9/19. She had DES to sequential SVG-OM1/PLOM. While hospitalized, she was noted to have runs of atrial fibrillation. She was very symptomatic with the atrial fibrillation. Eliquis and amiodarone were started.   She was admitted 12/16-12/19/19 for cardiomems implant and RHC. She was diuresed with IV lasix and transitioned to torsemide 100 mg BID + metolazone once/week. DC weight: 295 lbs.  She had a prolonged hospitalization in 2/20 with acute respiratory failure and fever to 103.  She was thought to have PNA and treated with cefepime.  She was intubated.  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  Amiodarone was stopped and she was put on prednisone. Echo in 2/20 showed EF down to 25-30% with mid-apical LV severe hypokinesis. She had AKI. Possible ITP triggered by infection, HIT negative.  ITP was treated with Solumedrol. She was discharged to SNF.  ORIF left ankle 4/20.   11/20 admission initially with concern for TIAs (staring spells), ended up thinking possible partial seizures.  Head MRI did not show CVA.  Echo in 11/20 showed EF 45-50%, moderately decreased RV systolic function, moderate RV enlargement, moderate-severe MR, moderate TR.   When seen on 12/10, she was back at home from rehab.  She is short of breath with exertion,  dyspnea walking down the hall in her house.  She uses a walker.  She has orthopnea, props up in bed.  Occasional atypical chest pain that is fleeting. No BRBPR/melena.  She was taking torsemide 80 mg daily.   At her 12/10 visit, the Plavix was stopped (>1 yr since PCI).  She was asked to start sending cardio mems transmissions again.  Her torsemide was increased from 80 mg daily to 80 mg a.m. and 40 mg p.m.  Her potassium was increased from 40 mEq a.m. to 40 mEq a.m. and 20 mEq p.m.  She was to get a BMET in 10 days.  Her spironolactone 25 mg daily and Toprol-XL 50 mg daily were continued but she was to restart losartan 12.5 mg daily.  No other medication changes were made.  She was to continue oxygen nightly as she does not tolerate CPAP.  She was in sinus rhythm, no longer on amiodarone due to suspected amiodarone lung toxicity.  She had completed steroids.  A TEE was discussed to assess for mitral regurgitation, as she might be a candidate for MitraClip.  She was agreeable to the procedure.  On 12/15, her family called the office because her systolic blood pressure was in the 70s.  She was very weak and having presyncope because of this.  They brought her to the ER and cardiology was asked to evaluate her. She was at home trying to ambulate to the bathroom when she felt very weak. She denies current chest pain or SOB.    Past Medical History:  Diagnosis Date  . Anemia    hx  . Anxiety   . Asthma   . Basal cell carcinoma 05/2014   "left shoulder"  . Bundle branch block, left    chronic/notes 07/18/2013  . CHF (congestive heart failure) ([place])   . Chronic insomnia 05/06/2015  . Chronic kidney disease    frequency, sees dr Jamal Maes every 4 to 6 months (01/16/2018)  . Chronic lower back pain   . Claustrophobia   . Common migraine 05/14/2014  . Coronary artery disease    MI in 2001, 2002, 2006, 2011, 2014  . Depression   . Diabetic peripheral neuropathy ([place]) 01/12/2019  . GERD  (gastroesophageal reflux disease)   . H/O hiatal hernia   . Headache    "at least 2/month" (01/16/2018)  . Heart murmur   . Hyperlipidemia   . Hypertension   . Memory change 05/14/2014  . Migraine    "5-6/year"  (01/16/2018)  . Obesity 01-2010  . Obstructive sleep apnea    "can't wear machine; I have claustrophobia" (01/16/2018), states she had a 2nd sleep study and does not have sleep apnea, her O2 decreases and now is on 2 L of O2 at night.  . On home oxygen therapy    "2L at night and prn during daytime" (01/16/2018)  . Osteoarthritis    "knees and hands" (01/16/2018)  . Peripheral vascular disease (HCC)    ? numbness, tingling arms and legs  . PONV (postoperative nausea and vomiting)   . Stroke [facility])  2014, 2015, 2016  . Type II diabetes mellitus ([place])   . Ventral hernia    hx of    Past Surgical History:  Procedure Laterality Date  . ANKLE FRACTURE SURGERY Left 1970's  . APPENDECTOMY  1970's   w/hysterectomy  . BASAL CELL CARCINOMA EXCISION Left 05/2014   "shoulder" (01/16/2018)  . CARDIAC CATHETERIZATION  10/10/2012   Dr [name].  Marland Kitchen CARDIAC CATHETERIZATION N/A 05/29/2015   Procedure: Right/Left Heart Cath and Coronary/Graft Angiography;  Surgeon: Larey Dresser, MD;  Location: [place] CV LAB;  Service: Cardiovascular;  Laterality: N/A;  . CAROTID ENDARTERECTOMY Left 03/2013  . CAROTID STENT INSERTION Left 03/20/2013   Procedure: CAROTID STENT INSERTION;  Surgeon: [name], MD;  Location: [facility] CATH LAB;  Service: Cardiovascular;  Laterality: Left;  internal carotid  . CEREBRAL ANGIOGRAM N/A 04/05/2011   Procedure: CEREBRAL ANGIOGRAM;  Surgeon: Angelia Mould, MD;  Location: [facility] CATH LAB;  Service: Cardiovascular;  Laterality: N/A;  . CHOLECYSTECTOMY OPEN  2004  . CORONARY ANGIOPLASTY WITH STENT PLACEMENT  01,02,05,06,07,08,11; 04/24/2013   "I've probably got ~ 10 stents by now" (04/24/2013)  . CORONARY ANGIOPLASTY WITH STENT PLACEMENT  06/13/2013   "got 4  stents today" (06/13/2013)  . CORONARY ARTERY BYPASS GRAFT  1220/11   "CABG X5"  . CORONARY STENT INTERVENTION N/A 10/20/2017   Procedure: CORONARY STENT INTERVENTION;  Surgeon: Troy Sine, MD;  Location: [place] CV LAB;  Service: Cardiovascular;  Laterality: N/A;  . ESOPHAGOGASTRODUODENOSCOPY  08/03/2011   Procedure: ESOPHAGOGASTRODUODENOSCOPY (EGD);  Surgeon: Shann Medal, MD;  Location: [facility];  Service: General;  Laterality: N/A;  . ESOPHAGOGASTRODUODENOSCOPY (EGD) WITH PROPOFOL N/A 03/11/2014   Procedure: ESOPHAGOGASTRODUODENOSCOPY (EGD) WITH PROPOFOL;  Surgeon: Lafayette Dragon, MD;  Location: [facility] ENDOSCOPY;  Service: Endoscopy;  Laterality: N/A;  . FRACTURE SURGERY    . gall stone removal  05/2003  . GASTRIC RESTRICTION SURGERY  1984   "stapeling"  . HERNIA REPAIR  2004   "in my stomach; had OR on it twice", wire mesh on 1 hernia  . LEFT HEART CATH AND CORS/GRAFTS ANGIOGRAPHY N/A 07/09/2016   Procedure: LEFT HEART CATH AND CORS/GRAFTS ANGIOGRAPHY;  Surgeon: Larey Dresser, MD;  Location: [place] CV LAB;  Service: Cardiovascular;  Laterality: N/A;  . ORIF ANKLE FRACTURE Left 05/16/2018  . ORIF ANKLE FRACTURE Left 05/16/2018   Procedure: OPEN REDUCTION INTERNAL FIXATION (ORIF) Left ankle with possible syndesmosis fixation;  Surgeon: Nicholes Stairs, MD;  Location: [place];  Service: Orthopedics;  Laterality: Left;  18mn  . OVARY SURGERY  1970's   "tumor removed"  . PERCUTANEOUS CORONARY STENT INTERVENTION (PCI-S) N/A 06/13/2013   Procedure: PERCUTANEOUS CORONARY STENT INTERVENTION (PCI-S);  Surgeon: JJettie Booze MD;  [facility];  Service: Cardiovascular;  Laterality: N/A;  . PERCUTANEOUS STENT INTERVENTION N/A 04/24/2013   Procedure: PERCUTANEOUS STENT INTERVENTION;  Surgeon: JJettie Booze MD;  [facility];  Service: Cardiovascular;  Laterality: N/A;  . RIGHT/LEFT HEART CATH AND CORONARY/GRAFT ANGIOGRAPHY N/A 10/20/2017   Procedure:  RIGHT/LEFT HEART CATH AND CORONARY/GRAFT ANGIOGRAPHY;  Surgeon: MLarey Dresser MD;  [facility];  Service: Cardiovascular;  Laterality: N/A;  . ROOT CANAL  10/2000  . TIBIA FRACTURE SURGERY Right 1970's   rods and pins  . TOOTH EXTRACTION     "1 on the upper; wisdom tooth on the lower" (01/16/2018)  . TOTAL ABDOMINAL HYSTERECTOMY  1970's   w/ appendectomy  Prior to Admission medications   Medication Sig Start Date End Date Taking? Authorizing Provider  acetaminophen (TYLENOL) 500 MG tablet Take 1 tablet (500 mg total) by mouth every 6 (six) hours as needed for moderate pain or fever. Patient taking differently: Take 1,000 mg by mouth every 6 (six) hours as needed for moderate pain, fever or headache.  09/28/18   Angiulli, Lavon Paganini, PA-C  albuterol (VENTOLIN HFA) 108 (90 Base) MCG/ACT inhaler Inhale 2 puffs into the lungs every 4 (four) hours as needed for wheezing or shortness of breath.     [provider]  Alirocumab (PRALUENT) 150 MG/ML SOAJ Inject 1 pen into the skin every 14 (fourteen) days. Patient taking differently: Inject 150 mg into the skin every 14 (fourteen) days.  08/18/18   Larey Dresser, MD  ALPRAZolam Duanne Moron) 0.5 MG tablet Take 1 tablet (0.5 mg total) by mouth 2 (two) times daily as needed for anxiety. Patient taking differently: Take 0.5-1 mg by mouth See admin instructions. Take one tablet (0.5 mg) by mouth every morning and two tablets (1 mg) at night, may also take 1 tablet (0.5 mg) midday as needed for anxiety 09/28/18   Angiulli, Lavon Paganini, PA-C  apixaban (ELIQUIS) 5 MG TABS tablet Take 1 tablet (5 mg total) by mouth 2 (two) times daily. 09/28/18   Angiulli, Lavon Paganini, PA-C  bisacodyl (DULCOLAX) 5 MG EC tablet Take 1 tablet (5 mg total) by mouth daily as needed for moderate constipation. 05/19/18 05/19/19  Nicholes Stairs, MD  denosumab (PROLIA) 60 MG/ML SOSY injection Inject 60 mg into the skin every 6 (six) months.     [provider]  DULoxetine (CYMBALTA) 30 MG capsule Take 1 capsule (30 mg total) by mouth daily. 09/28/18   Angiulli, Lavon Paganini, PA-C  ezetimibe (ZETIA) 10 MG tablet Take 1 tablet (10 mg total) by mouth at bedtime. 04/10/18   Cherene Altes, MD  Fluticasone-Salmeterol (ADVAIR) 250-50 MCG/DOSE AEPB Inhale 1 puff into the lungs daily.    [provider]  gabapentin (NEURONTIN) 300 MG capsule Take 1 capsule (300 mg total) by mouth daily. Patient taking differently: Take 300-600 mg by mouth See admin instructions. Take two capsules (600 mg) by mouth every morning and one capsule (300 mg) at night; may also take one capsule (300 mg) midday as needed for nerve pain 09/28/18   Angiulli, Lavon Paganini, PA-C  HYDROcodone-acetaminophen (NORCO/VICODIN) 5-325 MG tablet Take 1 tablet by mouth at bedtime as needed for moderate pain.    [provider]  Insulin Glargine (LANTUS SOLOSTAR) 100 UNIT/ML Solostar Pen Inject 38-44 Units into the skin See admin instructions. Inject 38 units subcutaneously daily at bedtime, increase to 44 units for CBG >200    [provider]  insulin lispro (HUMALOG KWIKPEN) 100 UNIT/ML KwikPen Inject 14-18 Units into the skin See admin instructions. Inject 14 units subcutaneously prior to breakfast and supper; add 4 units for CBG >200    [provider]  ipratropium-albuterol (DUONEB) 0.5-2.5 (3) MG/3ML SOLN Take 3 mLs by nebulization 2 (two) times daily. Patient taking differently: Take 3 mLs by nebulization 2 (two) times daily as needed (shortness of breath).  04/10/18   Cherene Altes, MD  losartan (COZAAR) 25 MG tablet Take 0.5 tablets (12.5 mg total) by mouth daily. 01/11/19   Larey Dresser, MD  metolazone (ZAROXOLYN) 2.5 MG tablet Take 1 tablet (2.5 mg total) by mouth daily as needed (As directed by heart failure clinic. Call heart  failure office for direction when experiencing overnight weight gain). 12/26/18   Geradine Girt, DO  metoprolol  succinate (TOPROL-XL) 50 MG 24 hr tablet Take 1 tablet (50 mg total) by mouth daily. Take with or immediately following a meal. 09/28/18   Angiulli, Lavon Paganini, PA-C  Multiple Vitamin (MULTIVITAMIN WITH MINERALS) TABS tablet Take 1 tablet by mouth every morning. Centrum - Women over 82    [provider]  Multiple Vitamins-Minerals ([place]) CAPS Take 1 capsule by mouth every morning.    [provider]  nitroGLYCERIN (NITROSTAT) 0.3 MG SL tablet Place 1 tablet (0.3 mg total) under the tongue every 5 (five) minutes x 3 doses as needed for chest pain. 09/15/17   Georgiana Shore, NP  ondansetron (ZOFRAN-ODT) 4 MG disintegrating tablet Take 4 mg by mouth 2 (two) times daily as needed for nausea or vomiting.    [provider]  OXYGEN Inhale 2 L into the lungs at bedtime as needed (shortness of breath).     [provider]  pantoprazole (PROTONIX) 40 MG tablet Take 1 tablet (40 mg total) by mouth daily. 09/28/18   Angiulli, Lavon Paganini, PA-C  Polyvinyl Alcohol-Povidone (REFRESH OP) Place 1 drop into both eyes daily as needed (redness/ dry eyes).    [provider]  potassium chloride SA (KLOR-CON) 20 MEQ tablet Take 2 tablets by mouth every morning and 1 tablet by mouth every evening Patient taking differently: Take 1 tablets by mouth every morning and 1 tablet by mouth every evening 01/11/19   Larey Dresser, MD  Pyridoxine HCl (VITAMIN B-6 PO) Take 1 tablet by mouth every morning.    [provider]  ranolazine (RANEXA) 500 MG 12 hr tablet Take 1 tablet (500 mg total) by mouth 2 (two) times daily. 09/28/18   Angiulli, Lavon Paganini, PA-C  rosuvastatin (CRESTOR) 40 MG tablet Take 1 tablet (40 mg total) by mouth at bedtime. 09/28/18   Angiulli, Lavon Paganini, PA-C  spironolactone (ALDACTONE) 25 MG tablet Take 1 tablet (25 mg total) by mouth daily. 11/13/18   Larey Dresser, MD  torsemide (DEMADEX) 20 MG tablet Take 4 tablets (80 mg) by mouth every  morning, take 2 tablets (40 mg) every evening 01/11/19   Larey Dresser, MD  traZODone (DESYREL) 50 MG tablet Take 1 tablet (50 mg total) by mouth at bedtime. 01/12/19   Kathrynn Ducking, MD  triamcinolone cream (KENALOG) 0.1 % Apply 1 application topically daily as needed (crusty spots on arms).    [provider]  vitamin B-12 (CYANOCOBALAMIN) 1000 MCG tablet Take 1,000 mcg by mouth every morning.     [provider]  zolpidem (AMBIEN) 10 MG tablet Take 0.5 tablets (5 mg total) by mouth at bedtime. 12/26/18   Geradine Girt, DO    Inpatient Medications: Scheduled Meds: . apixaban  5 mg Oral BID  . insulin aspart  0-5 Units Subcutaneous QHS  . [START ON 01/17/2019] insulin aspart  0-9 Units Subcutaneous TID WC  . insulin glargine  15 Units Subcutaneous QHS  . [START ON 01/17/2019] rosuvastatin  40 mg Oral q1800  . sodium chloride flush  3 mL Intravenous Q12H  . sodium chloride flush  3 mL Intravenous Q12H   Continuous Infusions: . sodium chloride     PRN Meds:   Allergies:    Allergies  Allergen Reactions  . Amoxicillin Shortness Of Breath and Rash  . Brilinta [Ticagrelor] Shortness Of Breath  . Erythromycin  Shortness Of Breath, Other (See Comments) and Hives    Trouble swallowing  . Flagyl [Metronidazole] Shortness Of Breath and Palpitations  . Penicillins Hives, Shortness Of Breath, Rash and Other (See Comments)    Has patient had a PCN reaction causing immediate rash, facial/tongue/throat swelling, SOB or lightheadedness with hypotension: Yes Has patient had a PCN reaction causing severe rash involving mucus membranes or skin necrosis: No Has patient had a PCN reaction that required hospitalization: Yes Has patient had a PCN reaction occurring within the last 10 years: No If all of the above answers are "NO", then may proceed with Cephalosporin use.   . Isosorbide Mononitrate [Isosorbide Nitrate] Other (See Comments)    Joint aches, muscles hurt,  difficult to walk   . Jardiance [Empagliflozin] Other (See Comments)    Nausea, joint aches, muscles aches  . Metformin And Related Other (See Comments)    Stomach pain, cold sweats, joint pain, burred vision, dizziness  . Tape Other (See Comments)    Skin pulls off with certain types Plastic tape causes skin to rip if left on for long periods of time  . Erythromycin Base Rash    Social History:   Social History   Socioeconomic History  . Marital status: Married    Spouse name: [name]   . Number of children: 1  . Years of education: 12+  . Highest education level: Not on file  Occupational History  . Occupation: retired  Tobacco Use  . Smoking status: Never Smoker  . Smokeless tobacco: Never Used  Substance and Sexual Activity  . Alcohol use: Never  . Drug use: Never  . Sexual activity: Not Currently    Birth control/protection: Surgical    Comment: hysterectomy  Other Topics Concern  . Not on file  Social History Narrative   Patient lives at home with husband [name].    Patient has 1 child.    Patient is not currently working.    Patient has 2 years of college.    Patient is right handed.   Patient drinks caffeine occasionally.   Social Determinants of Health   Financial Resource Strain:   . Difficulty of Paying Living Expenses: Not on file  Food Insecurity:   . Worried About Charity fundraiser in the Last Year: Not on file  . Ran Out of Food in the Last Year: Not on file  Transportation Needs:   . Lack of Transportation (Medical): Not on file  . Lack of Transportation (Non-Medical): Not on file  Physical Activity:   . Days of Exercise per Week: Not on file  . Minutes of Exercise per Session: Not on file  Stress:   . Feeling of Stress : Not on file  Social Connections:   . Frequency of Communication with Friends and Family: Not on file  . Frequency of Social Gatherings with Friends and Family: Not on file  . Attends Religious Services: Not on file  .  Active Member of Clubs or Organizations: Not on file  . Attends Archivist Meetings: Not on file  . Marital Status: Not on file  Intimate Partner Violence:   . Fear of Current or Ex-Partner: Not on file  . Emotionally Abused: Not on file  . Physically Abused: Not on file  . Sexually Abused: Not on file    Family History:   Family History  Problem Relation Age of Onset  . Heart disease Father        Heart Disease before  age 21  . Diabetes Father   . Hyperlipidemia Father   . Hypertension Father   . Heart attack Father   . Deep vein thrombosis Father   . AAA (abdominal aortic aneurysm) Father   . Hypertension Mother   . Deep vein thrombosis Mother   . AAA (abdominal aortic aneurysm) Mother   . Cancer Sister        Ovarian  . Hypertension Sister   . Diabetes Paternal Grandmother   . Heart disease Paternal Uncle   . Diabetes Paternal Uncle   . Heart attack Paternal Grandfather 6       died of MI at 33  . Diabetes Paternal Aunt   . Diabetes Paternal Uncle   . Colon cancer Neg Hx    Family Status:  Family Status  Relation Name Status  . Father  Deceased at age 83       Massive Heart Attack  . Mother  Alive  . Sister  Alive  . PGM  Deceased  . Annamarie Major  (Not Specified)  . PGF  (Not Specified)  . Ethlyn Daniels  (Not Specified)  . Annamarie Major  (Not Specified)  . Neg Hx  (Not Specified)    ROS:  Please see the history of present illness.  All other ROS reviewed and negative.     Physical Exam/Data:   Vitals:   01/16/19 1830 01/16/19 1845 01/16/19 2045 01/16/19 2223  BP: (!) 104/50 125/87 (!) 121/53 (!) 118/58  Pulse: (!) 33 (!) 37 (!) 41 (!) 40  Resp: (!) '25 19 20 18  ' Temp:      TempSrc:      SpO2: 96% 95% 95% 99%    Intake/Output Summary (Last 24 hours) at 01/16/2019 2325 Last data filed at 01/16/2019 1753 Gross per 24 hour  Intake 500 ml  Output --  Net 500 ml   There were no vitals filed for this visit. There is no height or weight on file to  calculate BMI.  General:  Well nourished, well developed, female who appears fatigued  HEENT: normal Neck: JVP - at 45 degrees no significant JVD Vascular: No carotid bruits; 4/4 extremity pulses 2+  Cardiac: regular bradycardic rhythm; 2/6 holosystolic murmur heard at the apex Lungs:  clear bilaterally, no wheezing, rhonchi or rales  Abd: soft, nontender, no hepatomegaly  Ext: no edema Musculoskeletal:  No deformities, BUE and BLE strength normal and equal Skin: warm and dry  Neuro:  CNs 2-12 intact, no focal abnormalities noted Psych:  Normal affect   EKG:  The EKG was personally reviewed and demonstrates: Sinus bradycardia with a baseline heart rate of 40 and interpolated PVCs, right bundle branch block is new, previously she had a left bundle branch block  Telemetry:  Telemetry was personally reviewed and demonstrates: Sinus bradycardia with a heart rate in the 30-40s   CV studies:   ECHO: 12/24/2018  1. Left ventricular ejection fraction, by visual estimation, is 45 to 50%. The left ventricle has mildly decreased function. There is no left ventricular hypertrophy.  2. Left ventricular diastolic parameters are indeterminate.  3. Septal and posterior lateral hypokinesis.  4. Global right ventricle has moderately reduced systolic function.The right ventricular size is moderately enlarged. No increase in right ventricular wall thickness.  5. Left atrial size was severely dilated.  6. Right atrial size was severely dilated.  7. Severe calcification of the mitral valve leaflet(s).  8. Severe mitral annular calcification.  9. Severe thickening of the mitral  valve leaflet(s). 10. The mitral valve is degenerative. Moderate to severe mitral valve regurgitation. No evidence of mitral stenosis. 11. The tricuspid valve is normal in structure. Tricuspid valve regurgitation moderate. 12. The aortic valve is tricuspid. Aortic valve regurgitation is not visualized. No evidence of aortic valve  sclerosis or stenosis. 13. There is Moderate thickening of the aortic valve. 14. There is Sclerosis without any evidence of stenosis of the aortic valve. 15. The pulmonic valve was grossly normal. Pulmonic valve regurgitation is moderate. 16. Moderately elevated pulmonary artery systolic pressure. 17. The inferior vena cava is normal in size with greater than 50% respiratory variability, suggesting right atrial pressure of 3 mmHg.   PCI: 10/20/2017 Successful direct stenting of the proximal 70% smooth  eccentric saphenous vein graft stenosis sequentially supplying to distal circumflex marginal vessels with insertion of a 3.5 x 18 mm Xience Sierra DES stent postdilated to 3.8 mm with the stenosis being reduced to 0%.  There was brisk TIMI-3 flow and no evidence for dissection.  RECOMMENDATION: Medical therapy for the patient's concomitant CAD and chronic diastolic heart failure. Recommend uninterrupted dual antiplatelet therapy with Aspirin 82m daily and Clopidogrel 754mdaily for a minimum of 12 months.    CATH: 10/20/2017 75% proximal stenosis in sequential SVG-OM1 and PLOM is the likely culprit for her angina.  She has had this lesion in past, likely there is a venous valve there as well.  It appears to have worsened somewhat.  Discussed with Dr. KeClaiborne Billingswill plan intervention.  Right Heart Pressures RHC Procedural Findings: Hemodynamics (mmHg) RA mean 11 RV 36/10 PA 34/12, mean 23 PCWP mean 15 AO 116/57  Oxygen saturations: PA 74% AO 97%  Cardiac Output (Fick) 6.61  Cardiac Index (Fick) 2.85   Left Main  Mild disease.  Left Anterior Descending  Subtotal occlusion of the proximal LAD. Patent LIMA-LAD. There were serial 60% stenoses in the mid to distal LAD after LIMA touchdown. The SVG-D was patent.  Left Circumflex  50% proximal and mid LCx stenosis. OM1 with severe diffuse disease proximally. PLOM with diffuse disease. Sequential SVG-OM1 and PLOM was patent with a proximal  75% stenosis (likely also at the site of a venous valve).  Right Coronary Artery  Stented segment in the proximal to mid RCA is patent with 40% in-stent restenosis at the RCA ostium. 50% ostial PDA stenosis. The SVG-RCA is known to be occluded and was not injected.       Laboratory Data:   Chemistry Recent Labs  Lab 01/11/19 1625 01/16/19 1645  NA 140 136  K 3.5 5.2*  CL 95* 97*  CO2 30 24  GLUCOSE 125* 141*  BUN 19 40*  CREATININE 1.46* 2.28*  CALCIUM 9.6 9.0  GFRNONAA 36* 21*  GFRAA 42* 24*  ANIONGAP 15 15    Lab Results  Component Value Date   ALT 17 12/23/2018   AST 23 12/23/2018   ALKPHOS 84 12/23/2018   BILITOT 0.5 12/23/2018   Hematology Recent Labs  Lab 01/16/19 1645  WBC 10.8*  RBC 4.04  HGB 9.9*  HCT 34.5*  MCV 85.4  MCH 24.5*  MCHC 28.7*  RDW 18.9*  PLT 217   Cardiac Enzymes High Sensitivity Troponin:   Recent Labs  Lab 01/16/19 1645 01/16/19 2035  TROPONINIHS 25* 25*      BNP Recent Labs  Lab 01/11/19 1600  BNP 473.7*    DDimer No results for input(s): DDIMER in the last 168 hours. TSH:  Lab Results  Component  Value Date   TSH 1.571 09/18/2018   Lipids: Lab Results  Component Value Date   CHOL 90 12/24/2018   HDL 34 (L) 12/24/2018   LDLCALC 38 12/24/2018   LDLDIRECT 156.7 02/06/2014   TRIG 90 12/24/2018   CHOLHDL 2.6 12/24/2018   HgbA1c: Lab Results  Component Value Date   HGBA1C 7.0 (H) 09/10/2018   Magnesium:  Magnesium  Date Value Ref Range Status  09/20/2018 2.0 1.7 - 2.4 mg/dL Final    Comment:    Performed at [facility], [place] [address] [place], [place] [address]     Radiology/Studies:  DG Chest Portable 1 View  Result Date: 01/16/2019 CLINICAL DATA:  Weakness. EXAM: PORTABLE CHEST 1 VIEW COMPARISON:  09/18/2018 FINDINGS: Stable enlargement of the cardiopericardial silhouette. Stable changes from prior CABG surgery. No mediastinal or hilar masses. Clear lungs.  No pleural effusion or  pneumothorax. Skeletal structures are grossly intact. IMPRESSION: 1. No acute cardiopulmonary disease. 2. Stable cardiomegaly. Electronically Signed   By: Lajean Manes M.D.   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 Does not appear to be AFIB SVR. Will hold metoprolol and evaluate overnight. Pacing pads should be readily available. Will likely need EP consultation in the morning.   AKI - will hold torsemide, spironolactone, losartan at this time and reevaluate symptoms. May be helpful to involve advanced heart failure team given close contact as an outpatient.   Hyperkalemia - hold potassium and reevaluate in AM.   CAD - continue plavix and crestor  afib - continue eliquis  Severe MR - could consider obtaining TEE while inpatient, stabilize HR first.     For questions or updates, please contact [place] Please consult [email or web address] for contact info under Cardiology/STEMI.   Signed, Elouise Munroe, MD  01/16/2019 11:25 PM

## 2019-01-17 DIAGNOSIS — I447 Left bundle-branch block, unspecified: Secondary | ICD-10-CM | POA: Diagnosis present

## 2019-01-17 DIAGNOSIS — Z20828 Contact with and (suspected) exposure to other viral communicable diseases: Secondary | ICD-10-CM | POA: Diagnosis present

## 2019-01-17 DIAGNOSIS — I5043 Acute on chronic combined systolic (congestive) and diastolic (congestive) heart failure: Secondary | ICD-10-CM

## 2019-01-17 DIAGNOSIS — E1122 Type 2 diabetes mellitus with diabetic chronic kidney disease: Secondary | ICD-10-CM | POA: Diagnosis present

## 2019-01-17 DIAGNOSIS — N170 Acute kidney failure with tubular necrosis: Secondary | ICD-10-CM | POA: Diagnosis not present

## 2019-01-17 DIAGNOSIS — M545 Low back pain: Secondary | ICD-10-CM | POA: Diagnosis present

## 2019-01-17 DIAGNOSIS — I480 Paroxysmal atrial fibrillation: Secondary | ICD-10-CM | POA: Diagnosis present

## 2019-01-17 DIAGNOSIS — E785 Hyperlipidemia, unspecified: Secondary | ICD-10-CM | POA: Diagnosis present

## 2019-01-17 DIAGNOSIS — N1832 Chronic kidney disease, stage 3b: Secondary | ICD-10-CM

## 2019-01-17 DIAGNOSIS — I25708 Atherosclerosis of coronary artery bypass graft(s), unspecified, with other forms of angina pectoris: Secondary | ICD-10-CM | POA: Diagnosis not present

## 2019-01-17 DIAGNOSIS — I081 Rheumatic disorders of both mitral and tricuspid valves: Secondary | ICD-10-CM | POA: Diagnosis present

## 2019-01-17 DIAGNOSIS — K219 Gastro-esophageal reflux disease without esophagitis: Secondary | ICD-10-CM | POA: Diagnosis present

## 2019-01-17 DIAGNOSIS — E1151 Type 2 diabetes mellitus with diabetic peripheral angiopathy without gangrene: Secondary | ICD-10-CM | POA: Diagnosis present

## 2019-01-17 DIAGNOSIS — N183 Chronic kidney disease, stage 3 unspecified: Secondary | ICD-10-CM | POA: Diagnosis present

## 2019-01-17 DIAGNOSIS — Z85828 Personal history of other malignant neoplasm of skin: Secondary | ICD-10-CM | POA: Diagnosis not present

## 2019-01-17 DIAGNOSIS — K449 Diaphragmatic hernia without obstruction or gangrene: Secondary | ICD-10-CM | POA: Diagnosis present

## 2019-01-17 DIAGNOSIS — J9611 Chronic respiratory failure with hypoxia: Secondary | ICD-10-CM | POA: Diagnosis present

## 2019-01-17 DIAGNOSIS — I5042 Chronic combined systolic (congestive) and diastolic (congestive) heart failure: Secondary | ICD-10-CM

## 2019-01-17 DIAGNOSIS — G8929 Other chronic pain: Secondary | ICD-10-CM | POA: Diagnosis present

## 2019-01-17 DIAGNOSIS — I340 Nonrheumatic mitral (valve) insufficiency: Secondary | ICD-10-CM | POA: Diagnosis not present

## 2019-01-17 DIAGNOSIS — R001 Bradycardia, unspecified: Secondary | ICD-10-CM | POA: Diagnosis present

## 2019-01-17 DIAGNOSIS — N179 Acute kidney failure, unspecified: Secondary | ICD-10-CM | POA: Diagnosis present

## 2019-01-17 DIAGNOSIS — Z794 Long term (current) use of insulin: Secondary | ICD-10-CM

## 2019-01-17 DIAGNOSIS — Z9981 Dependence on supplemental oxygen: Secondary | ICD-10-CM | POA: Diagnosis not present

## 2019-01-17 DIAGNOSIS — I130 Hypertensive heart and chronic kidney disease with heart failure and stage 1 through stage 4 chronic kidney disease, or unspecified chronic kidney disease: Secondary | ICD-10-CM | POA: Diagnosis present

## 2019-01-17 DIAGNOSIS — Z8249 Family history of ischemic heart disease and other diseases of the circulatory system: Secondary | ICD-10-CM | POA: Diagnosis not present

## 2019-01-17 DIAGNOSIS — E1129 Type 2 diabetes mellitus with other diabetic kidney complication: Secondary | ICD-10-CM | POA: Diagnosis not present

## 2019-01-17 DIAGNOSIS — I252 Old myocardial infarction: Secondary | ICD-10-CM | POA: Diagnosis not present

## 2019-01-17 DIAGNOSIS — E1142 Type 2 diabetes mellitus with diabetic polyneuropathy: Secondary | ICD-10-CM | POA: Diagnosis present

## 2019-01-17 DIAGNOSIS — I639 Cerebral infarction, unspecified: Secondary | ICD-10-CM | POA: Diagnosis present

## 2019-01-17 DIAGNOSIS — F5104 Psychophysiologic insomnia: Secondary | ICD-10-CM | POA: Diagnosis present

## 2019-01-17 LAB — HEMOGLOBIN A1C
Hgb A1c MFr Bld: 7.2 % — ABNORMAL HIGH (ref 4.8–5.6)
Mean Plasma Glucose: 159.94 mg/dL

## 2019-01-17 LAB — BASIC METABOLIC PANEL
Anion gap: 12 (ref 5–15)
BUN: 37 mg/dL — ABNORMAL HIGH (ref 8–23)
CO2: 26 mmol/L (ref 22–32)
Calcium: 9 mg/dL (ref 8.9–10.3)
Chloride: 101 mmol/L (ref 98–111)
Creatinine, Ser: 1.97 mg/dL — ABNORMAL HIGH (ref 0.44–1.00)
GFR calc Af Amer: 29 mL/min — ABNORMAL LOW (ref >60)
GFR calc non Af Amer: 25 mL/min — ABNORMAL LOW (ref >60)
Glucose, Bld: 184 mg/dL — ABNORMAL HIGH (ref 70–99)
Potassium: 4.3 mmol/L (ref 3.5–5.1)
Sodium: 139 mmol/L (ref 135–145)

## 2019-01-17 LAB — URINALYSIS, COMPLETE (UACMP) WITH MICROSCOPIC
Bilirubin Urine: NEGATIVE
Glucose, UA: NEGATIVE mg/dL
Hgb urine dipstick: NEGATIVE
Ketones, ur: NEGATIVE mg/dL
Nitrite: POSITIVE — AB
Protein, ur: NEGATIVE mg/dL
Specific Gravity, Urine: 1.008 (ref 1.005–1.030)
pH: 5 (ref 5.0–8.0)

## 2019-01-17 LAB — CBC
HCT: 31.4 % — ABNORMAL LOW (ref 36.0–46.0)
Hemoglobin: 9.4 g/dL — ABNORMAL LOW (ref 12.0–15.0)
MCH: 24.6 pg — ABNORMAL LOW (ref 26.0–34.0)
MCHC: 29.9 g/dL — ABNORMAL LOW (ref 30.0–36.0)
MCV: 82.2 fL (ref 80.0–100.0)
Platelets: 187 10*3/uL (ref 150–400)
RBC: 3.82 MIL/uL — ABNORMAL LOW (ref 3.87–5.11)
RDW: 18.7 % — ABNORMAL HIGH (ref 11.5–15.5)
WBC: 8.6 10*3/uL (ref 4.0–10.5)
nRBC: 0 % (ref 0.0–0.2)

## 2019-01-17 LAB — GLUCOSE, CAPILLARY
Glucose-Capillary: 189 mg/dL — ABNORMAL HIGH (ref 70–99)
Glucose-Capillary: 149 mg/dL — ABNORMAL HIGH (ref 70–99)
Glucose-Capillary: 208 mg/dL — ABNORMAL HIGH (ref 70–99)
Glucose-Capillary: 267 mg/dL — ABNORMAL HIGH (ref 70–99)

## 2019-01-17 LAB — SODIUM, URINE, RANDOM: Sodium, Ur: 35 mmol/L

## 2019-01-17 LAB — MRSA PCR SCREENING: MRSA by PCR: POSITIVE — AB

## 2019-01-17 LAB — SARS CORONAVIRUS 2 (TAT 6-24 HRS): SARS Coronavirus 2: NEGATIVE

## 2019-01-17 LAB — CBG MONITORING, ED: Glucose-Capillary: 120 mg/dL — ABNORMAL HIGH (ref 70–99)

## 2019-01-17 LAB — TSH: TSH: 0.982 u[IU]/mL (ref 0.350–4.500)

## 2019-01-17 LAB — TROPONIN I (HIGH SENSITIVITY): Troponin I (High Sensitivity): 33 ng/L — ABNORMAL HIGH (ref <18)

## 2019-01-17 LAB — CREATININE, URINE, RANDOM: Creatinine, Urine: 31.96 mg/dL

## 2019-01-17 MED ORDER — INFLUENZA VAC A&B SA ADJ QUAD 0.5 ML IM PRSY
0.5000 mL | PREFILLED_SYRINGE | INTRAMUSCULAR | Status: DC
  Filled 2019-01-17: qty 0.5

## 2019-01-17 MED ORDER — CLOPIDOGREL BISULFATE 75 MG PO TABS
75.0000 mg | ORAL_TABLET | Freq: Every day | ORAL | Status: DC
  Administered 2019-01-17 – 2019-01-19 (×3): 75 mg via ORAL
  Filled 2019-01-17 (×3): qty 1

## 2019-01-17 MED ORDER — SODIUM CHLORIDE 0.9 % IV SOLN: INTRAVENOUS | Status: DC

## 2019-01-17 MED ORDER — FUROSEMIDE 10 MG/ML IJ SOLN
60.0000 mg | Freq: Once | INTRAMUSCULAR | Status: AC
  Administered 2019-01-17: 60 mg via INTRAVENOUS
  Filled 2019-01-17: qty 6

## 2019-01-17 MED ORDER — CHLORHEXIDINE GLUCONATE CLOTH 2 % EX PADS
6.0000 | MEDICATED_PAD | Freq: Every day | CUTANEOUS | Status: DC
  Administered 2019-01-18 – 2019-01-19 (×2): 6 via TOPICAL

## 2019-01-17 MED ORDER — MUPIROCIN 2 % EX OINT
1.0000 "application " | TOPICAL_OINTMENT | Freq: Two times a day (BID) | CUTANEOUS | Status: DC
  Administered 2019-01-17 – 2019-01-19 (×5): 1 via NASAL
  Filled 2019-01-17: qty 22

## 2019-01-17 MED ORDER — PNEUMOCOCCAL VAC POLYVALENT 25 MCG/0.5ML IJ INJ: 0.5000 mL | INJECTION | INTRAMUSCULAR | Status: DC

## 2019-01-17 NOTE — Consult Note (Signed)
WOC Nurse Consult Note: Reason for Consult: Partial thickness wound to left buttock, not pressure. Wound type: unknown etiology, trauma (excoriation) vs infectious Pressure Injury POA: Yes/No/NA Measurement:1cm x 2cm x 0.1cm Wound VZC:HYIF with a small white pustule in the center Drainage (amount, consistency, odor) scant serous Periwound:intact Dressing procedure/placement/frequency:  Patient has no recollection of injury. Lesion is not consistent with herpetic etiology. Treatment will be aimed at resolution of the pustule and regeneration of the epithelial tissue.  I have provided Nursing with guidance via the Orders for a daily application of a size-appropriate piece of xeroform gauze topped with a gauze 2x2 and covered with our house silicone foam. Patient has a large amount of urinary incontinence and is using the PurWick urinary incontinence device.   [place] nursing team will not follow, but will remain available to this patient, the nursing and medical teams.  Please re-consult if needed. Thanks, Maudie Flakes, MSN, RN, [place], Arther Abbott  Pager# [phone]

## 2019-01-17 NOTE — Progress Notes (Signed)
Patient ID: [name] [name], female   DOB: May 27, 1945, 73 y.o.   [name]: [id number]  PROGRESS NOTE    [name] [name]  EUM:[id number] DOB: [birth year] DOA: 01/16/2019 PCP: Derinda Late, MD   Brief Narrative:  73 year old female with history of coronary artery disease, chronic kidney disease stage III, paroxysmal A. fib on Eliquis, depression, anxiety, insulin-dependent diabetes mellitus and chronic systolic and diastolic heart failure presented with fatigue, low heart rate and low blood pressure.  On presentation, his heart rate was dipping into the mid 30s.  Cardiology was consulted.  Creatinine was 2.28, up from 1.46 a few days earlier.  She was started on IV fluids.  High-sensitivity troponin was 25.  Assessment & Plan:   Symptomatic bradycardia along with hypotension -Presented with fatigue, near syncope and heart rate in the 30s with SBP 70s at home -Received a dose of atropine in the ED -Cardiology following.  Heart rate still in the 40s to 50s. -Continue telemetry monitoring.  Follow further cardiology recommendations  Acute kidney injury on chronic kidney disease stage III -Creatinine 2.28 on admission, up from 1.46 a few days earlier. -Creatinine 1.97 today.   -Probably from prerenal azotemia in the setting of increased diuretics and hypotension, also on ARB -Was given IV fluids in the ED.  Will put her on normal saline at 75 cc an hour.  Repeat a.m. creatinine.  Watch out for signs of fluid overload.  Paroxysmal A. fib on Eliquis -Continue Eliquis.  Beta-blocker on hold.  Cardiology following  Chronic systolic and diastolic heart failure -Appears compensated.  Echo from November 2020 showed EF of 45 to 30%  -Strict input and output.  Daily weights. -Cardiology following.  Diuretic and ARB along with beta-blocker on hold.  Depression, anxiety -Continue home regimen  Diabetes mellitus type 2 uncontrolled with hyperglycemia -Hemoglobin A1c 7.2 -Continue CBGs with SSI along with  Lantus  Anemia of chronic disease -Probably from chronic any disease.  Hemoglobin stable.  Generalized deconditioning -PT eval   Stage II pressure injury on buttocks: Present on admission -Wound care consult.   DVT prophylaxis: Eliquis Code Status: Full Family Communication: Spoke to David/husband on 01/17/2019 on phone Disposition Plan: Home once cleared by cardiology  Consultants: Cardiology  Procedures: None  Antimicrobials: None   Subjective: Patient seen and examined at bedside.  She denies any current chest pain, worsening shortness of breath, fever, nausea or vomiting.  Objective: Vitals:   01/17/19 0555 01/17/19 0600 01/17/19 0754 01/17/19 0900  BP:  (!) 126/50 123/61 (!) 127/46  Pulse:  (!) 46 (!) 50 (!) 50  Resp: 16 15 16 15   Temp:  (!) 97.2 F (36.2 C) (!) 97.4 F (36.3 C)   TempSrc:  Oral Oral   SpO2:  100% 99% 98%  Weight:      Height:        Intake/Output Summary (Last 24 hours) at 01/17/2019 1007 Last data filed at 01/17/2019 0800 Gross per 24 hour  Intake 1500 ml  Output 1000 ml  Net 500 ml   Filed Weights   01/17/19 0351  Weight: 106.9 kg    Examination:  General exam: Appears calm and comfortable; no acute distress. Respiratory system: Bilateral decreased breath sounds at bases Cardiovascular system: S1 & S2 heard, bradycardic Gastrointestinal system: Abdomen is nondistended, soft and nontender. Normal bowel sounds heard. Extremities: No cyanosis, clubbing, edema  Central nervous system: Alert and oriented. No focal neurological deficits. Moving extremities Skin: No rashes, lesions or ulcers Psychiatry: Flat affect  Data Reviewed: I have personally reviewed following labs and imaging studies  CBC: Recent Labs  Lab 01/16/19 1645 01/17/19 0535  WBC 10.8* 8.6  HGB 9.9* 9.4*  HCT 34.5* 31.4*  MCV 85.4 82.2  PLT 217 673   Basic Metabolic Panel: Recent Labs  Lab 01/11/19 1625 01/16/19 1645 01/17/19 0535  NA 140 136  139  K 3.5 5.2* 4.3  CL 95* 97* 101  CO2 30 24 26   GLUCOSE 125* 141* 184*  BUN 19 40* 37*  CREATININE 1.46* 2.28* 1.97*  CALCIUM 9.6 9.0 9.0   GFR: Estimated Creatinine Clearance: 30.1 mL/min (A) (by C-G formula based on SCr of 1.97 mg/dL (H)). Liver Function Tests: No results for input(s): AST, ALT, ALKPHOS, BILITOT, PROT, ALBUMIN in the last 168 hours. No results for input(s): LIPASE, AMYLASE in the last 168 hours. No results for input(s): AMMONIA in the last 168 hours. Coagulation Profile: No results for input(s): INR, PROTIME in the last 168 hours. Cardiac Enzymes: No results for input(s): CKTOTAL, CKMB, CKMBINDEX, TROPONINI in the last 168 hours. BNP (last 3 results) No results for input(s): PROBNP in the last 8760 hours. HbA1C: Recent Labs    01/17/19 0535  HGBA1C 7.2*   CBG: Recent Labs  Lab 01/17/19 0026 01/17/19 0617  GLUCAP 120* 189*   Lipid Profile: No results for input(s): CHOL, HDL, LDLCALC, TRIG, CHOLHDL, LDLDIRECT in the last 72 hours. Thyroid Function Tests: No results for input(s): TSH, T4TOTAL, FREET4, T3FREE, THYROIDAB in the last 72 hours. Anemia Panel: No results for input(s): VITAMINB12, FOLATE, FERRITIN, TIBC, IRON, RETICCTPCT in the last 72 hours. Sepsis Labs: No results for input(s): PROCALCITON, LATICACIDVEN in the last 168 hours.  Recent Results (from the past 240 hour(s))  Novel Coronavirus, NAA (Hosp order, Send-out to Ref Lab; TAT 18-24 hrs     Status: None   Collection Time: 01/15/19  1:00 PM   Specimen: Nasopharyngeal Swab; Respiratory  Result Value Ref Range Status   SARS-CoV-2, NAA NOT DETECTED NOT DETECTED Final    Comment: (NOTE) This nucleic acid amplification test was developed and its performance characteristics determined by [employer]. Nucleic acid amplification tests include PCR and TMA. This test has not been FDA cleared or approved. This test has been authorized by FDA under an Emergency Use Authorization  (EUA). This test is only authorized for the duration of time the declaration that circumstances exist justifying the authorization of the emergency use of in vitro diagnostic tests for detection of SARS-CoV-2 virus and/or diagnosis of COVID-19 infection under section 564(b)(1) of the Act, 21 U.S.C. 419FXT-0(W) (1), unless the authorization is terminated or revoked sooner. When diagnostic testing is negative, the possibility of a false negative result should be considered in the context of a patient's recent exposures and the presence of clinical signs and symptoms consistent with COVID-19. An individual without symptoms of COVID- 19 and who is not shedding SARS-CoV-2 vi rus would expect to have a negative (not detected) result in this assay. Performed At: [facility] [address] [place], [place] [id number] Rush Farmer MD JM:[id number]    [place]  Final    Comment: Performed at [facility], [place] [address], [place], [place] [address]  SARS CORONAVIRUS 2 (TAT 6-24 HRS) Nasopharyngeal Nasopharyngeal Swab     Status: None   Collection Time: 01/16/19  7:32 PM   Specimen: Nasopharyngeal Swab  Result Value Ref Range Status   SARS Coronavirus 2 NEGATIVE NEGATIVE Final    Comment: (NOTE)  SARS-CoV-2 target nucleic acids are NOT DETECTED. The SARS-CoV-2 RNA is generally detectable in upper and lower respiratory specimens during the acute phase of infection. Negative results do not preclude SARS-CoV-2 infection, do not rule out co-infections with other pathogens, and should not be used as the sole basis for treatment or other patient management decisions. Negative results must be combined with clinical observations, patient history, and epidemiological information. The expected result is Negative. Fact Sheet for Patients: SugarRoll.be Fact Sheet for Healthcare  Providers: https://www.woods-mathews.com/ This test is not yet approved or cleared by the [place] FDA and  has been authorized for detection and/or diagnosis of SARS-CoV-2 by FDA under an Emergency Use Authorization (EUA). This EUA will remain  in effect (meaning this test can be used) for the duration of the COVID-19 declaration under Section 56 4(b)(1) of the Act, 21 U.S.C. section 360bbb-3(b)(1), unless the authorization is terminated or revoked sooner. Performed at [facility], [place] [address], [place], [place] [address]   MRSA PCR Screening     Status: Abnormal   Collection Time: 01/17/19  4:07 AM   Specimen: Nasopharyngeal  Result Value Ref Range Status   MRSA by PCR POSITIVE (A) NEGATIVE Final    Comment:        The GeneXpert MRSA Assay (FDA approved for NASAL specimens only), is one component of a comprehensive MRSA colonization surveillance program. It is not intended to diagnose MRSA infection nor to guide or monitor treatment for MRSA infections. RESULT CALLED TO, READ BACK BY AND VERIFIED WITH: HAZERMAN,K RN 01/17/2019 AT 2725 SKEEN,P Performed at [facility], [place] [address] [place], [place] [address]          Radiology Studies: DG Chest Portable 1 View  Result Date: 01/16/2019 CLINICAL DATA:  Weakness. EXAM: PORTABLE CHEST 1 VIEW COMPARISON:  09/18/2018 FINDINGS: Stable enlargement of the cardiopericardial silhouette. Stable changes from prior CABG surgery. No mediastinal or hilar masses. Clear lungs.  No pleural effusion or pneumothorax. Skeletal structures are grossly intact. IMPRESSION: 1. No acute cardiopulmonary disease. 2. Stable cardiomegaly. Electronically Signed   By: Lajean Manes M.D.   On: 01/16/2019 17:19        Scheduled Meds: . apixaban  5 mg Oral BID  . [START ON 01/18/2019] Chlorhexidine Gluconate Cloth  6 each Topical Q0600  . [START ON 01/18/2019] influenza vaccine adjuvanted  0.5 mL Intramuscular  Tomorrow-1000  . insulin aspart  0-5 Units Subcutaneous QHS  . insulin aspart  0-9 Units Subcutaneous TID WC  . insulin glargine  15 Units Subcutaneous QHS  . mupirocin ointment  1 application Nasal BID  . [START ON 01/18/2019] pneumococcal 23 valent vaccine  0.5 mL Intramuscular Tomorrow-1000  . rosuvastatin  40 mg Oral q1800  . sodium chloride flush  3 mL Intravenous Q12H  . sodium chloride flush  3 mL Intravenous Q12H   Continuous Infusions: . sodium chloride            [name] August, MD [facility] 01/17/2019, 10:07 AM

## 2019-01-17 NOTE — Consult Note (Addendum)
Advanced Heart Failure Team Consult Note   Primary Physician: Derinda Late, MD PCP-Cardiologist:  Larae Grooms, MD  HF: Dr. [name]   Reason for Consultation: Combined Systolic and Diastolic Heart Failure, Hypotension and Bradycardia  HPI:    [name] [name] is seen today for evaluation of hypotension and bradycardia in the setting of chronic systolic heart failure and mitral regurgitation at the request of Dr. Margaretann Loveless, General Cardiology.   [name] [name] is a 73 y.o. female who has a history of CAD s/p CABG, diastolic CHF, and cerebrovascular disease with history of CVA presents for followup of CHF and diastolic CHF. She had CABG x 5 in 12/11. Prior to the CABG she had multiple PCIs. She had a CVA in 2/14 that presented as visual loss. She had a left carotid stent in 2/15.   Left heart cath done in Sept. 2014. This showed patent SVG-D, LIMA-LAD, and sequential SVG-OM branches but SVG-RCA and the native RCA were both totally occluded. It was felt aht her increased symptoms coincided with occlusion of SVG-RCA. She was started on Imdur to see if this would help with the chest pain and dyspnea but did not tolerate Imdur due to HAs. She was started on Ranexa and dose was titrated to 1000 mg bid. She had some improvement, but not markedly. Therefore, sent to Dr. Irish Lack to address opening RCA CTO. He was able to do this in 5/15 with 4 overlapping Xience DES. This led to resolution of exertional chest pain. she was started on Brilinta after PCI but discontinued due to dyspnea. Changed to Plavix and symptoms resolved. She developed recurrent increasing exertional dyspnea and chest pain and had repeat RHC/LHC was done in 4/17. This showed stable CAD with no interventional target. Left and right heart filling pressures were not significantly increased. Medical management recommended.   She had an MRI/MRA in May 2018 that showed moderately severe stenosis of the supraclinoid  segment of the right internal carotid artery, progressed since the 2016 MR angiogram and severe basilar artery stenosis.   She developed recurrent exertional chest pain and had LHC in 6/18, showing 4/5 grafts patent with patent native RCA (similar to prior cath, no changes).   Echo 1/19 with EF 55-60%, moderate diastolic dysfunction, PASP 50 mmHg, mildly dilated RV, mild AS, mild-moderate MR.   She reported increased dyspnea and chest pain and had RHC/LHC in 9/19. She had DES to sequential SVG-OM1/PLOM. While hospitalized, she was noted to have runs of atrial fibrillation. She was very symptomatic with the atrial fibrillation. Eliquis and amiodarone were started.   She was admitted 12/16-12/19/19 for cardiomems implant and RHC. She was diuresed with IV lasix and transitioned to torsemide 100 mg BID + metolazone once/week. DC weight: 295 lbs.  She had a prolonged hospitalization in 2/20 with acute respiratory failure and fever to 103.  She was thought to have PNA and treated with cefepime.  She was intubated.  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  Amiodarone was stopped and she was put on prednisone. Echo in 2/20 showed EF down to 25-30% with mid-apical LV severe hypokinesis. She had AKI. Possible ITP triggered by infection, HIT negative.  ITP was treated with Solumedrol. She was discharged to SNF.    ORIF left ankle 4/20.   11/20 admission initially with concern for TIAs (staring spells), ended up thinking possible partial seizures.  Head MRI did not show CVA.  Echo in 11/20 showed EF 45-50%, moderately decreased  RV systolic function, moderate RV enlargement, moderate-severe MR, moderate TR.   Had recent clinic f/u w/ Dr. [name] 12/10 and was volume overloaded w/ NYHA Class II symptoms. EKG showed NSR. Torsemide was increased to 80 qam/40 qpm, along w/ increase of KCl to 40 qam/20 qpm. Losartan 12.5 mg was also added. There was concern that her MR was  likely contributing to her CHF and repeat TEE was recommended to reassess valvular function/ severity of regurgitation but study not yet completed.   She presented to [facility] ED yesterday w/ complaints of fatigue and low BP and pulse rate at home, SBPs in the 70s and pulse rate as low as the 30s per pt report. She reported near syncope but no frank syncope. She did fall due to weakness but denies LOC. She has had recent CP that feels like her usual angina. Her exertional dyspnea however had improved w/ diuretic increase.  In the ED, HR was down into the 30s w/ BP of 98/48. O2 sats in the 90s on RA. EKG showed sinus bradycardia 50 bmp w/ LBBB. Per ED note, she did require doses of atropine. Labs notable for hyperkalemia at 5.2. Also w/ AKI w/ SCr at 2.28 (baseline SCr ~1-1.4). WBC 10. Hgb 9.9 c/w baseline. Hs troponin flat, 25>>25.  BNP 473. COVID negative. Home meds held on admit. Was on  blocker therapy, Toprol XL 50 mg PTA. Losartan, Arlyce Harman and torsemide also held. She was also given IVFs for hydration. SCr improving, down from 2.28>>1.97. K normal at 4.3 today. WBC 8.6. She is feeling better today. Out of bed and in chair. Still w/ sinus bradycardia in the low 50s. Her last dose of metoprolol was yesterday morning. Hypotension resolved. Currently asymptomatic.    Echo 2D 12/2018   1. Left ventricular ejection fraction, by visual estimation, is 45 to 50%. The left ventricle has mildly decreased function. There is no left ventricular hypertrophy. 2. Left ventricular diastolic parameters are indeterminate. 3. Septal and posterior lateral hypokinesis. 4. Global right ventricle has moderately reduced systolic function.The right ventricular size is moderately enlarged. No increase in right ventricular wall thickness. 5. Left atrial size was severely dilated. 6. Right atrial size was severely dilated. 7. Severe calcification of the mitral valve leaflet(s). 8. Severe mitral annular calcification. 9.  Severe thickening of the mitral valve leaflet(s). 10. The mitral valve is degenerative. Moderate to severe mitral valve regurgitation. No evidence of mitral stenosis. 11. The tricuspid valve is normal in structure. Tricuspid valve regurgitation moderate. 12. The aortic valve is tricuspid. Aortic valve regurgitation is not visualized. No evidence of aortic valve sclerosis or stenosis. 13. There is Moderate thickening of the aortic valve. 14. There is Sclerosis without any evidence of stenosis of the aortic valve. 15. The pulmonic valve was grossly normal. Pulmonic valve regurgitation is moderate. 16. Moderately elevated pulmonary artery systolic pressure. 17. The inferior vena cava is normal in size with greater than 50% respiratory variability, suggesting right atrial pressure of 3 mmHg.  Review of Systems: [y] = yes, _0  = no   . General: Weight gain _1 ; Weight loss _2 ; Anorexia _3 ; Fatigue _4 ; Fever _5 ; Chills _6 ; Weakness _7   . Cardiac: Chest pain/pressure _8 ; Resting SOB _9 ; Exertional SOB _10 ; Orthopnea _11 ; Pedal Edema _12 ; Palpitations _13 ; Syncope _14 ; Presyncope _15 ; Paroxysmal nocturnal dyspnea_16   . Pulmonary: Cough _17 ; Wheezing_18 ; Hemoptysis_19 ; Sputum _20 ; Snoring _21   .  GI: Vomiting_0 ; Dysphagia_1 ; Melena_2 ; Hematochezia _3 ; Heartburn_4 ; Abdominal pain _5 ; Constipation _6 ; Diarrhea _7 ; BRBPR _8   . GU: Hematuria_9 ; Dysuria _10 ; Nocturia_11   . Vascular: Pain in legs with walking _12 ; Pain in feet with lying flat _13 ; Non-healing sores _14 ; Stroke _15 ; TIA _16 ; Slurred speech _17 ;  . Neuro: Headaches_18 ; Vertigo_19 ; Seizures_20 ; Paresthesias_21 ;Blurred vision _22 ; Diplopia _23 ; Vision changes _24   . Ortho/Skin: Arthritis _25 ; Joint pain _26 ; Muscle pain _27 ; Joint swelling _28 ; Back Pain _29 ; Rash _30   . Psych: Depression_31 ; Anxiety_32   . Heme: Bleeding problems _33 ; Clotting disorders _34 ; Anemia _35   . Endocrine: Diabetes _36 ; Thyroid dysfunction_37   Home  Medications Prior to Admission medications   Medication Sig Start Date End Date Taking? Authorizing Provider  acetaminophen (TYLENOL) 500 MG tablet Take 1 tablet (500 mg total) by mouth every 6 (six) hours as needed for moderate pain or fever. Patient taking differently: Take 1,000 mg by mouth every 6 (six) hours as needed for moderate pain, fever or headache.  09/28/18  Yes Angiulli, Lavon Paganini, PA-C  albuterol (VENTOLIN HFA) 108 (90 Base) MCG/ACT inhaler Inhale 2 puffs into the lungs every 4 (four) hours as needed for wheezing or shortness of breath.    Yes [provider]  Alirocumab (PRALUENT) 150 MG/ML SOAJ Inject 1 pen into the skin every 14 (fourteen) days. Patient taking differently: Inject 150 mg into the skin every 14 (fourteen) days.  08/18/18  Yes Larey Dresser, MD  ALPRAZolam Duanne Moron) 0.5 MG tablet Take 1 tablet (0.5 mg total) by mouth 2 (two) times daily as needed for anxiety. Patient taking differently: Take 0.5-1 mg by mouth See admin instructions. Take one tablet (0.5 mg) by mouth every morning and two tablets (1 mg) at night, may also take 1 tablet (0.5 mg) midday as needed for anxiety 09/28/18  Yes Angiulli, Lavon Paganini, PA-C  apixaban (ELIQUIS) 5 MG TABS tablet Take 1 tablet (5 mg total) by mouth 2 (two) times daily. 09/28/18  Yes Angiulli, Lavon Paganini, PA-C  bisacodyl (DULCOLAX) 5 MG EC tablet Take 1 tablet (5 mg total) by mouth daily as needed for moderate constipation. 05/19/18 05/19/19 Yes Nicholes Stairs, MD  clopidogrel (PLAVIX) 75 MG tablet Take 75 mg by mouth at bedtime.   Yes [provider]  denosumab (PROLIA) 60 MG/ML SOSY injection Inject 60 mg into the skin every 6 (six) months.    Yes [provider]  DULoxetine (CYMBALTA) 30 MG capsule Take 1 capsule (30 mg total) by mouth daily. 09/28/18  Yes Angiulli, Lavon Paganini, PA-C  ezetimibe (ZETIA) 10 MG tablet Take 1 tablet (10 mg total) by mouth at bedtime. 04/10/18  Yes Cherene Altes, MD    Fluticasone-Salmeterol (ADVAIR) 250-50 MCG/DOSE AEPB Inhale 1 puff into the lungs daily.   Yes [provider]  gabapentin (NEURONTIN) 300 MG capsule Take 1 capsule (300 mg total) by mouth daily. Patient taking differently: Take 300-600 mg by mouth See admin instructions. 375m in the mornings and 6025mat bedtime 09/28/18  Yes Angiulli, DaLavon PaganiniPA-C  HYDROcodone-acetaminophen (NORCO/VICODIN) 5-325 MG tablet Take 1 tablet by mouth at bedtime as needed for moderate pain.   Yes [provider]  Insulin Glargine (LANTUS SOLOSTAR) 100 UNIT/ML Solostar Pen Inject 38-44 Units into the skin See admin instructions.  Inject 38 units subcutaneously daily at bedtime, increase to 44 units for CBG >200   Yes [provider]  insulin lispro (HUMALOG KWIKPEN) 100 UNIT/ML KwikPen Inject 14-18 Units into the skin See admin instructions. Inject 14 units subcutaneously prior to breakfast and supper; add 4 units for CBG >200   Yes [provider]  ipratropium-albuterol (DUONEB) 0.5-2.5 (3) MG/3ML SOLN Take 3 mLs by nebulization 2 (two) times daily. Patient taking differently: Take 3 mLs by nebulization 2 (two) times daily as needed (asthma).  04/10/18  Yes Cherene Altes, MD  losartan (COZAAR) 25 MG tablet Take 0.5 tablets (12.5 mg total) by mouth daily. Patient taking differently: Take 12.5 mg by mouth at bedtime.  01/11/19  Yes Larey Dresser, MD  metolazone (ZAROXOLYN) 2.5 MG tablet Take 1 tablet (2.5 mg total) by mouth daily as needed (As directed by heart failure clinic. Call heart failure office for direction when experiencing overnight weight gain). 12/26/18  Yes Eulogio Bear U, DO  metoprolol succinate (TOPROL-XL) 50 MG 24 hr tablet Take 1 tablet (50 mg total) by mouth daily. Take with or immediately following a meal. 09/28/18  Yes Angiulli, Lavon Paganini, PA-C  Multiple Vitamin (MULTIVITAMIN WITH MINERALS) TABS tablet Take 1 tablet by mouth every morning. Centrum - Women over 58    Yes [provider]  Multiple Vitamins-Minerals (Hazen) CAPS Take 1 capsule by mouth every morning.   Yes [provider]  nitroGLYCERIN (NITROSTAT) 0.3 MG SL tablet Place 1 tablet (0.3 mg total) under the tongue every 5 (five) minutes x 3 doses as needed for chest pain. 09/15/17  Yes Georgiana Shore, NP  ondansetron (ZOFRAN-ODT) 4 MG disintegrating tablet Take 4 mg by mouth 2 (two) times daily as needed for nausea or vomiting.   Yes [provider]  OXYGEN Inhale 2 L into the lungs at bedtime as needed (shortness of breath).    Yes [provider]  pantoprazole (PROTONIX) 40 MG tablet Take 1 tablet (40 mg total) by mouth daily. 09/28/18  Yes Angiulli, Lavon Paganini, PA-C  Polyvinyl Alcohol-Povidone (REFRESH OP) Place 1 drop into both eyes daily as needed (redness/ dry eyes).   Yes [provider]  potassium chloride SA (KLOR-CON) 20 MEQ tablet Take 2 tablets by mouth every morning and 1 tablet by mouth every evening Patient taking differently: Take 20 mEq by mouth 2 (two) times daily.  01/11/19  Yes Larey Dresser, MD  Pyridoxine HCl (VITAMIN B-6 PO) Take 1 tablet by mouth every morning.   Yes [provider]  ranolazine (RANEXA) 500 MG 12 hr tablet Take 1 tablet (500 mg total) by mouth 2 (two) times daily. 09/28/18  Yes Angiulli, Lavon Paganini, PA-C  rosuvastatin (CRESTOR) 40 MG tablet Take 1 tablet (40 mg total) by mouth at bedtime. 09/28/18  Yes Angiulli, Lavon Paganini, PA-C  spironolactone (ALDACTONE) 25 MG tablet Take 1 tablet (25 mg total) by mouth daily. Patient taking differently: Take 25 mg by mouth at bedtime.  11/13/18  Yes Larey Dresser, MD  torsemide (DEMADEX) 20 MG tablet Take 4 tablets (80 mg) by mouth every morning, take 2 tablets (40 mg) every evening 01/11/19  Yes Larey Dresser, MD  traZODone (DESYREL) 50 MG tablet Take 1 tablet (50 mg total) by mouth at bedtime. 01/12/19  Yes Kathrynn Ducking, MD  triamcinolone  cream (KENALOG) 0.1 % Apply 1 application topically daily as needed (crusty spots on arms).   Yes [provider]  vitamin B-12 (CYANOCOBALAMIN) 1000 MCG tablet Take 1,000 mcg by mouth every morning.    Yes [provider]  zolpidem (AMBIEN) 10 MG tablet Take 0.5 tablets (5 mg total) by mouth at bedtime. 12/26/18  Yes Geradine Girt, DO    Past Medical History: Past Medical History:  Diagnosis Date  . Anemia    hx  . Anxiety   . Asthma   . Basal cell carcinoma 05/2014   "left shoulder"  . Bundle branch block, left    chronic/notes 07/18/2013  . CHF (congestive heart failure) ([place])   . Chronic insomnia 05/06/2015  . Chronic kidney disease    frequency, sees dr Jamal Maes every 4 to 6 months (01/16/2018)  . Chronic lower back pain   . Claustrophobia   . Common migraine 05/14/2014  . Coronary artery disease    [name] in 2001, 2002, 2006, 2011, 2014  . Depression   . Diabetic peripheral neuropathy ([place]) 01/12/2019  . GERD (gastroesophageal reflux disease)   . H/O hiatal hernia   . Headache    "at least 2/month" (01/16/2018)  . Heart murmur   . Hyperlipidemia   . Hypertension   . Memory change 05/14/2014  . Migraine    "5-6/year"  (01/16/2018)  . Obesity 01-2010  . Obstructive sleep apnea    "can't wear machine; I have claustrophobia" (01/16/2018), states she had a 2nd sleep study and does not have sleep apnea, her O2 decreases and now is on 2 L of O2 at night.  . On home oxygen therapy    "2L at night and prn during daytime" (01/16/2018)  . Osteoarthritis    "knees and hands" (01/16/2018)  . Peripheral vascular disease (HCC)    ? numbness, tingling arms and legs  . PONV (postoperative nausea and vomiting)   . Stroke [facility])    2014, 2015, 2016  . Type II diabetes mellitus ([place])   . Ventral hernia    hx of    Past Surgical History: Past Surgical History:  Procedure Laterality Date  . ANKLE FRACTURE SURGERY Left 1970's  . APPENDECTOMY  1970's    w/hysterectomy  . BASAL CELL CARCINOMA EXCISION Left 05/2014   "shoulder" (01/16/2018)  . CARDIAC CATHETERIZATION  10/10/2012   Dr [name].  Marland Kitchen CARDIAC CATHETERIZATION N/A 05/29/2015   Procedure: Right/Left Heart Cath and Coronary/Graft Angiography;  Surgeon: Larey Dresser, MD;  Location: [facility];  Service: Cardiovascular;  Laterality: N/A;  . CAROTID ENDARTERECTOMY Left 03/2013  . CAROTID STENT INSERTION Left 03/20/2013   Procedure: CAROTID STENT INSERTION;  Surgeon: [name], MD;  Location: [facility] CATH LAB;  Service: Cardiovascular;  Laterality: Left;  internal carotid  . CEREBRAL ANGIOGRAM N/A 04/05/2011   Procedure: CEREBRAL ANGIOGRAM;  Surgeon: Angelia Mould, MD;  Location: [facility] CATH LAB;  Service: Cardiovascular;  Laterality: N/A;  . CHOLECYSTECTOMY OPEN  2004  . CORONARY ANGIOPLASTY WITH STENT PLACEMENT  01,02,05,06,07,08,11; 04/24/2013   "I've probably got ~ 10 stents by now" (04/24/2013)  . CORONARY ANGIOPLASTY WITH STENT PLACEMENT  06/13/2013   "got 4 stents today" (06/13/2013)  . CORONARY ARTERY BYPASS GRAFT  1220/11   "CABG X5"  . CORONARY STENT INTERVENTION N/A 10/20/2017   Procedure: CORONARY STENT INTERVENTION;  Surgeon: Troy Sine, MD;  Location: [place] [facility];  Service: Cardiovascular;  Laterality: N/A;  . ESOPHAGOGASTRODUODENOSCOPY  08/03/2011   Procedure: ESOPHAGOGASTRODUODENOSCOPY (EGD);  Surgeon: Shann Medal, MD;  Location: [facility];  Service: General;  Laterality: N/A;  . ESOPHAGOGASTRODUODENOSCOPY (EGD) WITH PROPOFOL N/A 03/11/2014   Procedure: ESOPHAGOGASTRODUODENOSCOPY (EGD) WITH PROPOFOL;  Surgeon: Lafayette Dragon, MD;  Location: [facility] ENDOSCOPY;  Service: Endoscopy;  Laterality: N/A;  . FRACTURE SURGERY    . gall stone removal  05/2003  . GASTRIC RESTRICTION SURGERY  1984   "stapeling"  . HERNIA REPAIR  2004   "in my stomach; had OR on it twice", wire mesh on 1 hernia  . LEFT HEART CATH AND CORS/GRAFTS ANGIOGRAPHY N/A 07/09/2016   Procedure:  LEFT HEART CATH AND CORS/GRAFTS ANGIOGRAPHY;  Surgeon: Larey Dresser, MD;  Location: [place] [facility];  Service: Cardiovascular;  Laterality: N/A;  . ORIF ANKLE FRACTURE Left 05/16/2018  . ORIF ANKLE FRACTURE Left 05/16/2018   Procedure: OPEN REDUCTION INTERNAL FIXATION (ORIF) Left ankle with possible syndesmosis fixation;  Surgeon: Nicholes Stairs, MD;  Location: [facility];  Service: Orthopedics;  Laterality: Left;  157mn  . OVARY SURGERY  1970's   "tumor removed"  . PERCUTANEOUS CORONARY STENT INTERVENTION (PCI-S) N/A 06/13/2013   Procedure: PERCUTANEOUS CORONARY STENT INTERVENTION (PCI-S);  Surgeon: JJettie Booze MD;  [facility];  Service: Cardiovascular;  Laterality: N/A;  . PERCUTANEOUS STENT INTERVENTION N/A 04/24/2013   Procedure: PERCUTANEOUS STENT INTERVENTION;  Surgeon: JJettie Booze MD;  [facility];  Service: Cardiovascular;  Laterality: N/A;  . RIGHT/LEFT HEART CATH AND CORONARY/GRAFT ANGIOGRAPHY N/A 10/20/2017   Procedure: RIGHT/LEFT HEART CATH AND CORONARY/GRAFT ANGIOGRAPHY;  Surgeon: MLarey Dresser MD;  [facility];  Service: Cardiovascular;  Laterality: N/A;  . ROOT CANAL  10/2000  . TIBIA FRACTURE SURGERY Right 1970's   rods and pins  . TOOTH EXTRACTION     "1 on the upper; wisdom tooth on the lower" (01/16/2018)  . TOTAL ABDOMINAL HYSTERECTOMY  1970's   w/ appendectomy    Family History: Family History  Problem Relation Age of Onset  . Heart disease Father        Heart Disease before age 306 . Diabetes Father   . Hyperlipidemia Father   . Hypertension Father   . Heart attack Father   . Deep vein thrombosis Father   . AAA (abdominal aortic aneurysm) Father   . Hypertension Mother   . Deep vein thrombosis Mother   . AAA (abdominal aortic aneurysm) Mother   . Cancer Sister        Ovarian  . Hypertension Sister   . Diabetes Paternal Grandmother   . Heart disease Paternal Uncle   . Diabetes Paternal Uncle    . Heart attack Paternal Grandfather 587      died of [name] at 58 . Diabetes Paternal Aunt   . Diabetes Paternal Uncle   . Colon cancer Neg Hx     Social History: Social History   Socioeconomic History  . Marital status: Married    Spouse name: DShanon Brow  . Number of children: 1  . Years of education: 12+  . Highest education level: Not on file  Occupational History  . Occupation: retired  Tobacco Use  . Smoking status: Never Smoker  . Smokeless tobacco: Never Used  Substance and Sexual Activity  . Alcohol use: Never  . Drug use: Never  . Sexual activity: Not Currently    Birth control/protection: Surgical    Comment: hysterectomy  Other Topics Concern  . Not on file  Social History Narrative   Patient lives at home with husband DShanon Brow  Patient has 1 child.    Patient is not currently working.    Patient has 2 years of college.    Patient is right handed.   Patient drinks caffeine occasionally.   Social Determinants of Health   Financial Resource Strain:   . Difficulty of Paying Living Expenses: Not on file  Food Insecurity:   . Worried About Charity fundraiser in the Last Year: Not on file  . Ran Out of Food in the Last Year: Not on file  Transportation Needs:   . Lack of Transportation (Medical): Not on file  . Lack of Transportation (Non-Medical): Not on file  Physical Activity:   . Days of Exercise per Week: Not on file  . Minutes of Exercise per Session: Not on file  Stress:   . Feeling of Stress : Not on file  Social Connections:   . Frequency of Communication with Friends and Family: Not on file  . Frequency of Social Gatherings with Friends and Family: Not on file  . Attends Religious Services: Not on file  . Active Member of Clubs or Organizations: Not on file  . Attends Archivist Meetings: Not on file  . Marital Status: Not on file    Allergies:  Allergies  Allergen Reactions  . Amoxicillin Shortness Of Breath and Rash  . Brilinta  [Ticagrelor] Shortness Of Breath  . Erythromycin Shortness Of Breath, Other (See Comments) and Hives    Trouble swallowing  . Flagyl [Metronidazole] Shortness Of Breath and Palpitations  . Penicillins Hives, Shortness Of Breath, Rash and Other (See Comments)    Has patient had a PCN reaction causing immediate rash, facial/tongue/throat swelling, SOB or lightheadedness with hypotension: Yes Has patient had a PCN reaction causing severe rash involving mucus membranes or skin necrosis: No Has patient had a PCN reaction that required hospitalization: Yes Has patient had a PCN reaction occurring within the last 10 years: No If all of the above answers are "NO", then may proceed with Cephalosporin use.   . Isosorbide Mononitrate [Isosorbide Nitrate] Other (See Comments)    Joint aches, muscles hurt, difficult to walk   . Jardiance [Empagliflozin] Other (See Comments)    Nausea, joint aches, muscles aches  . Metformin And Related Other (See Comments)    Stomach pain, cold sweats, joint pain, burred vision, dizziness  . Tape Other (See Comments)    Skin pulls off with certain types Plastic tape causes skin to rip if left on for long periods of time  . Erythromycin Base Rash    Objective:    Vital Signs:   Temp:  [96.6 F (35.9 C)-97.8 F (36.6 C)] 97.8 F (36.6 C) (12/16 1033) Pulse Rate:  [33-51] 51 (12/16 1033) Resp:  [14-25] 18 (12/16 1033) BP: (98-137)/(44-99) 137/44 (12/16 1033) SpO2:  [92 %-100 %] 99 % (12/16 1033) Weight:  [106.9 kg] 106.9 kg (12/16 0351) Last BM Date: 01/16/19  Weight change: Filed Weights   01/17/19 0351  Weight: 106.9 kg    Intake/Output:   Intake/Output Summary (Last 24 hours) at 01/17/2019 1211 Last data filed at 01/17/2019 1009 Gross per 24 hour  Intake 1503 ml  Output 1000 ml  Net 503 ml      Physical Exam    General:  Well appearing WF. No resp difficulty HEENT: normal Neck: supple. Elevated JVP to ear . Carotids 2+ bilat; no bruits.  No lymphadenopathy or thyromegaly appreciated. Cor: PMI nondisplaced. Regular rhythm, slow rate. 3/6 MR murmur best heard  at the apex Lungs: clear Abdomen: soft, nontender, nondistended. No hepatosplenomegaly. No bruits or masses. Good bowel sounds. Extremities: no cyanosis, clubbing, rash, edema Neuro: alert & orientedx3, cranial nerves grossly intact. moves all 4 extremities w/o difficulty. Affect pleasant   Telemetry   Sinus bradycardia low 50s. No high grade AV block.   EKG    Sinus bradycardia 50 bpm w/ LBBB  Labs   Basic Metabolic Panel: Recent Labs  Lab 01/11/19 1625 01/16/19 1645 01/17/19 0535  NA 140 136 139  K 3.5 5.2* 4.3  CL 95* 97* 101  CO2 _0 GLUCOSE 125* 141* 184*  BUN 19 40* 37*  CREATININE 1.46* 2.28* 1.97*  CALCIUM 9.6 9.0 9.0    Liver Function Tests: No results for input(s): AST, ALT, ALKPHOS, BILITOT, PROT, ALBUMIN in the last 168 hours. No results for input(s): LIPASE, AMYLASE in the last 168 hours. No results for input(s): AMMONIA in the last 168 hours.  CBC: Recent Labs  Lab 01/16/19 1645 01/17/19 0535  WBC 10.8* 8.6  HGB 9.9* 9.4*  HCT 34.5* 31.4*  MCV 85.4 82.2  PLT 217 187    Cardiac Enzymes: No results for input(s): CKTOTAL, CKMB, CKMBINDEX, TROPONINI in the last 168 hours.  BNP: BNP (last 3 results) Recent Labs    09/10/18 0925 09/18/18 0627 01/11/19 1600  BNP >4,500.0* 226.7* 473.7*    ProBNP (last 3 results) No results for input(s): PROBNP in the last 8760 hours.   CBG: Recent Labs  Lab 01/17/19 0026 01/17/19 0617 01/17/19 1035  GLUCAP 120* 189* 149*    Coagulation Studies: No results for input(s): LABPROT, INR in the last 72 hours.   Imaging   DG Chest Portable 1 View  Result Date: 01/16/2019 CLINICAL DATA:  Weakness. EXAM: PORTABLE CHEST 1 VIEW COMPARISON:  09/18/2018 FINDINGS: Stable enlargement of the cardiopericardial silhouette. Stable changes from prior CABG surgery. No mediastinal or  hilar masses. Clear lungs.  No pleural effusion or pneumothorax. Skeletal structures are grossly intact. IMPRESSION: 1. No acute cardiopulmonary disease. 2. Stable cardiomegaly. Electronically Signed   By: Lajean Manes M.D.   On: 01/16/2019 17:19      Medications:     Current Medications: . apixaban  5 mg Oral BID  . [START ON 01/18/2019] Chlorhexidine Gluconate Cloth  6 each Topical Q0600  . clopidogrel  75 mg Oral Daily  . furosemide  60 mg Intravenous Once  . [START ON 01/18/2019] influenza vaccine adjuvanted  0.5 mL Intramuscular Tomorrow-1000  . insulin aspart  0-5 Units Subcutaneous QHS  . insulin aspart  0-9 Units Subcutaneous TID WC  . insulin glargine  15 Units Subcutaneous QHS  . mupirocin ointment  1 application Nasal BID  . [START ON 01/18/2019] pneumococcal 23 valent vaccine  0.5 mL Intramuscular Tomorrow-1000  . rosuvastatin  40 mg Oral q1800  . sodium chloride flush  3 mL Intravenous Q12H  . sodium chloride flush  3 mL Intravenous Q12H     Infusions: . sodium chloride         Patient Profile   [name] [name] is a 73 y.o. female who has a history of CAD s/p CABG and multiple PCIs, chronic combined systolic and diastolic CHF (last EF 03-70%), mitral regurgitation and cerebrovascular disease with history of CVA and left carotid stent, admitted for hypotension, bradycardia and AKI.    Assessment/Plan   1. Bradycardia: bradycardic on admit w/ HR in the 30s in the ED w/ associated hypotension w/ SBP in the  70s, requiring atropine. EKG shows sinus brady 50 bpm w/ LBBB (chronic). No high grade arrhthymias detected on tele. Was on  blocker therapy w/ Toprol XL 50 mg daily PTA (last dose 12/15 AM). Held on admit. K 5.2 in the setting of AKI but improved today at 4.3. Remains sinus brady in the low 50s - continue  blocker washout - will check TSH - She has known CAD w/ h/o CABG and previous RCA PCI and reports recent CP c/w prior anginal pain but Hs troponin is flat,  25>>25. Given no high grade AV block, doubt coronary ischemia to be primary cause of bradycardia. Will continue to assess w/  blocker washout.  - no indication for PPM at this time. Can consider 30 day outpatient monitor at d/c  2. Hypotension: resolved. Suspect likely 2/2 over diuresis after recent diuretic dose adjustment + addition of Losartan. SCr 2.28 and BUN 40 on admit. Home diuretics/ antihypertensives held on admit  and IVF given w/ improvement in renal indices and BP.  - now volume up. Will give IV Lasix 60 mg x 1.  - Monitor volume status closely and will need to gradually add back HF meds as renal function allows  3. Acute on Chronic Combined Systolic and Diastolic CHF: Echo in 1/28 with EF 55-60%, moderate diastolic dysfunction.Cardiomems placed 01/16/18.  Echo in 3/20 in setting of severe PNA with intubation showed EF 25-30%, mid-apical LV severe hypokinesis.  Possible stress (Takotsubo-type) cardiomyopathy related to severe medical illness versus progression of CAD.  She has baseline chronic LBBB which could play a role in cardiomyopathy as well (LBBB cardiomyopathy).  Repeat echo in 11/20 with EF up to 45-50%, moderately dysfunctional RV. - diuretics, losartan and spiro held for hypotension and AKI.  blocker on hold for bradycardia - received IVFs for hypotension and AKI. No volume up. Will give IV Lasix 60 mg x 1.  - she has cardiomems implant. Will see if we can use to help evaluate fluid status during admission - plan to resume HF meds once renal function improves - monitor volume status closely. Strict I/Os and daily wts  4. Mitral Regurgitation: Moderate to severe MR noted on recent TTE 11/20. With multiple exacerbations and readmits for CHF, will need TEE to better assess severity and consideration of MV clip if severe. Plan TEE tomorrow.   5. AKI: Suspect likely 2/2 over diuresis after recent diuretic dose adjustment + recent addition of Losartan. SCr 2.28 and BUN 40 on  admit. Home diuretics/ antihypertensives remain on hold and IVF given w/ improvement in renal indices. Now volume up. Will give IV Lasix 60 mg x 1.  - continue to monitor  6. Hypokalemia: 5.2 on admit. Resolved. 4.3 today - Plan to resume spiro and losartan if renal function normalizes. Will need to reduce dose of KCl on d/c  7. CAD: She has known CAD w/ h/o CABG and multiple PCIs. H/o chronic stable angina. Hs troponin trend flat, 25>>25. Currently CP free.  - continue Plavix + statin - continue to hold  blocker   Length of Stay: 0  Lyda Jester, PA-C  01/17/2019, 12:11 PM  Advanced Heart Failure Team Pager [phone] (M-F; 7a - 4p)  Please contact [facility] for night-coverage after hours (4p -7a ) and weekends on [email or web address]  Patient seen with PA, agree with the above note.    She was admitted with hypotension (SBP 70s) and bradycardia, appears to be sinus brady in 30s at admission with PACs. She had AKI  with hyperkalemia. She did not have worrisome chest pain and troponin was not significantly elevated.   Toprol XL, losartan, spironolactone, and torsemide were held. She was given IV fluid.  Today, creatinine down to 1.97 and K is normal.  She is now in sinus brady in 50s with IVCD.  BP now stable.   General: NAD Neck: JVP 12 cm, no thyromegaly or thyroid nodule.  Lungs: Clear to auscultation bilaterally with normal respiratory effort. CV: Nondisplaced PMI.  Heart regular S1/S2, no S3/S4, 2/6 HSM apex.  Trace ankle edema.  No carotid bruit.  Normal pedal pulses.  Abdomen: Soft, nontender, no hepatosplenomegaly, no distention.  Skin: Intact without lesions or rashes.  Neurologic: Alert and oriented x 3.  Psych: Normal affect. Extremities: No clubbing or cyanosis.  HEENT: Normal.   Sinus bradycardia improving off Toprol XL, continue to hold.  HR in 50s with baseline IVCD.  Do not think we need to involve EP yet, continue to monitor off beta blocker.   AKI resolving with  hydration and hold torsemide, losartan, spironolactone.  Continue to hold losartan and spironolactone.   She is now volume overloaded on exam.  Will give Lasix 60 mg IV x 1 and follow response.  She will likely need further IV Lasix.   Planned for TEE as outpatient tomorrow to assess mitral regurgitation, will plan to do it as inpatient.  NPO at midnight.   She will continue on Plavix in addition to Eliquis, Dr. Jannifer Franklin wants her to continue for cerebrovascular disease.   Loralie Champagne 01/17/2019 3:33 PM

## 2019-01-17 NOTE — Evaluation (Signed)
Physical Therapy Evaluation Patient Details Name: [name] [name] [name]: [id number] DOB: 1945/03/10 Today's Date: 01/17/2019   History of Present Illness  Pt is 73 y.o. female with PMH significant for coronary artery disease, chronic kidney disease stage III, atrial fibrillation on Eliquis, depression, anxiety, insulin-dependent DM, and CHF (see H&P for full PMH), who presented to ED for evaluation of fatigue, near syncope, and abnormal vitals at home with heart rate in the 30s and SBP 70s.  Clinical Impression  Pt admitted with above diagnosis. During PT HR remained in the 50's bpm and no orthostatic hypotension.  Pt was able to transfer and walk 3' with min guard assist.  Limited distance due to bradycardia and first time OOB.  Pt currently with functional limitations due to the deficits listed below (see PT Problem List). Pt will benefit from skilled PT to increase their independence and safety with mobility to allow discharge to the venue listed below.       Follow Up Recommendations Home health PT    Equipment Recommendations  None recommended by PT    Recommendations for Other Services       Precautions / Restrictions Precautions Precautions: Fall Precaution Comments: monitor vitals      Mobility  Bed Mobility Overal bed mobility: Needs Assistance Bed Mobility: Supine to Sit     Supine to sit: Min guard        Transfers Overall transfer level: Needs assistance Equipment used: Rolling walker (2 wheeled) Transfers: Sit to/from Stand Sit to Stand: Min guard         General transfer comment: cues for hand placement  Ambulation/Gait Ambulation/Gait assistance: Min guard Gait Distance (Feet): 3 Feet Assistive device: Rolling walker (2 wheeled) Gait Pattern/deviations: Decreased stride length Gait velocity: decreased   General Gait Details: limited distance due to lines/leads and pt still with bradycardia  Stairs            Wheelchair Mobility     Modified Rankin (Stroke Patients Only)       Balance Overall balance assessment: Needs assistance Sitting-balance support: Feet supported;No upper extremity supported Sitting balance-Leahy Scale: Good     Standing balance support: Bilateral upper extremity supported;During functional activity Standing balance-Leahy Scale: Fair                               Pertinent Vitals/Pain Pain Assessment: No/denies pain    Home Living Family/patient expects to be discharged to:: Private residence Living Arrangements: Spouse/significant other Available Help at Discharge: Family;Available 24 hours/day Type of Home: House Home Access: Stairs to enter Entrance Stairs-Rails: None Entrance Stairs-Number of Steps: 1 Home Layout: Bed/bath upstairs;1/2 bath on main level(has stair lift) Home Equipment: Walker - 2 wheels;Shower seat;Other (comment);Grab bars - toilet;Grab bars - tub/shower;Wheelchair - manual(stair lift)      Prior Function Level of Independence: Needs assistance   Gait / Transfers Assistance Needed: Uses a RW for household ambulation; reports a significant decline in mobility since admission in November and steady decline since February.  Reports several falls due to "legs giving away"  ADL's / Homemaking Assistance Needed: Pt reports needing minimal assistance for showers but able to complete toielting; reports uses shower chair  Comments: Had HH PT     Hand Dominance        Extremity/Trunk Assessment   Upper Extremity Assessment Upper Extremity Assessment: RUE deficits/detail;LUE deficits/detail RUE Deficits / Details: ROM WFL; MMT 4/5 LUE Deficits / Details: ROM  WFL; MMT 4/5    Lower Extremity Assessment Lower Extremity Assessment: LLE deficits/detail;RLE deficits/detail RLE Deficits / Details: ROM WFL; MMT 4+/5 LLE Deficits / Details: ROM WFL; MMT 4+/5    Cervical / Trunk Assessment Cervical / Trunk Assessment: Normal  Communication    Communication: No difficulties(Pt with stuttering that she reports is new since Novemeber)  Cognition Arousal/Alertness: Awake/alert Behavior During Therapy: WFL for tasks assessed/performed Overall Cognitive Status: Within Functional Limits for tasks assessed                                        General Comments General comments (skin integrity, edema, etc.):   Vitals:   O2 sats 97% on RA HR in the 50's bpm throughout treatment Orthostatic BPs  Supine 128/40  Sitting 115/88     Standing 149/47  After tx to chair 151/48      Exercises     Assessment/Plan    PT Assessment Patient needs continued PT services  PT Problem List Decreased strength;Decreased activity tolerance;Decreased balance;Decreased mobility;Cardiopulmonary status limiting activity;Decreased knowledge of use of DME       PT Treatment Interventions DME instruction;Gait training;Stair training;Therapeutic exercise;Functional mobility training;Therapeutic activities;Balance training;Patient/family education    PT Goals (Current goals can be found in the Care Plan section)  Acute Rehab PT Goals Patient Stated Goal: return home PT Goal Formulation: With patient Time For Goal Achievement: 01/31/19 Potential to Achieve Goals: Good    Frequency Min 3X/week   Barriers to discharge        Co-evaluation               AM-PAC PT "6 Clicks" Mobility  Outcome Measure Help needed turning from your back to your side while in a flat bed without using bedrails?: None Help needed moving from lying on your back to sitting on the side of a flat bed without using bedrails?: None Help needed moving to and from a bed to a chair (including a wheelchair)?: None Help needed standing up from a chair using your arms (e.g., wheelchair or bedside chair)?: None Help needed to walk in hospital room?: A Little Help needed climbing 3-5 steps with a railing? : A Little 6 Click Score: 22    End of Session  Equipment Utilized During Treatment: Gait belt Activity Tolerance: Patient tolerated treatment well Patient left: in chair;with call bell/phone within reach;with chair alarm set Nurse Communication: Mobility status PT Visit Diagnosis: Unsteadiness on feet (R26.81);History of falling (Z91.81);Difficulty in walking, not elsewhere classified (R26.2)    Time: 6144-3154 PT Time Calculation (min) (ACUTE ONLY): 32 min   Charges:   PT Evaluation $PT Eval Moderate Complexity: 1 Mod          Maggie Font, PT Acute Rehab Services Pager [phone] [facility] [phone] [facility] [phone]   Karlton Lemon 01/17/2019, 1:53 PM

## 2019-01-17 NOTE — Discharge Instructions (Signed)

## 2019-01-17 NOTE — H&P (View-Only) (Signed)
Advanced Heart Failure Team Consult Note   Primary Physician: Derinda Late, MD PCP-Cardiologist:  Larae Grooms, MD  HF: Dr. [name]   Reason for Consultation: Combined Systolic and Diastolic Heart Failure, Hypotension and Bradycardia  HPI:    [name] [name] is seen today for evaluation of hypotension and bradycardia in the setting of chronic systolic heart failure and mitral regurgitation at the request of Dr. Margaretann Loveless, General Cardiology.   [name] [name] is a 73 y.o. female who has a history of CAD s/p CABG, diastolic CHF, and cerebrovascular disease with history of CVA presents for followup of CHF and diastolic CHF. She had CABG x 5 in 12/11. Prior to the CABG she had multiple PCIs. She had a CVA in 2/14 that presented as visual loss. She had a left carotid stent in 2/15.   Left heart cath done in Sept. 2014. This showed patent SVG-D, LIMA-LAD, and sequential SVG-OM branches but SVG-RCA and the native RCA were both totally occluded. It was felt aht her increased symptoms coincided with occlusion of SVG-RCA. She was started on Imdur to see if this would help with the chest pain and dyspnea but did not tolerate Imdur due to HAs. She was started on Ranexa and dose was titrated to 1000 mg bid. She had some improvement, but not markedly. Therefore, sent to Dr. Irish Lack to address opening RCA CTO. He was able to do this in 5/15 with 4 overlapping Xience DES. This led to resolution of exertional chest pain. she was started on Brilinta after PCI but discontinued due to dyspnea. Changed to Plavix and symptoms resolved. She developed recurrent increasing exertional dyspnea and chest pain and had repeat RHC/LHC was done in 4/17. This showed stable CAD with no interventional target. Left and right heart filling pressures were not significantly increased. Medical management recommended.   She had an MRI/MRA in May 2018 that showed moderately severe stenosis of the supraclinoid  segment of the right internal carotid artery, progressed since the 2016 MR angiogram and severe basilar artery stenosis.   She developed recurrent exertional chest pain and had LHC in 6/18, showing 4/5 grafts patent with patent native RCA (similar to prior cath, no changes).   Echo 1/19 with EF 55-60%, moderate diastolic dysfunction, PASP 50 mmHg, mildly dilated RV, mild AS, mild-moderate MR.   She reported increased dyspnea and chest pain and had RHC/LHC in 9/19. She had DES to sequential SVG-OM1/PLOM. While hospitalized, she was noted to have runs of atrial fibrillation. She was very symptomatic with the atrial fibrillation. Eliquis and amiodarone were started.   She was admitted 12/16-12/19/19 for cardiomems implant and RHC. She was diuresed with IV lasix and transitioned to torsemide 100 mg BID + metolazone once/week. DC weight: 295 lbs.  She had a prolonged hospitalization in 2/20 with acute respiratory failure and fever to 103.  She was thought to have PNA and treated with cefepime.  She was intubated.  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  Amiodarone was stopped and she was put on prednisone. Echo in 2/20 showed EF down to 25-30% with mid-apical LV severe hypokinesis. She had AKI. Possible ITP triggered by infection, HIT negative.  ITP was treated with Solumedrol. She was discharged to SNF.    ORIF left ankle 4/20.   11/20 admission initially with concern for TIAs (staring spells), ended up thinking possible partial seizures.  Head MRI did not show CVA.  Echo in 11/20 showed EF 45-50%, moderately decreased  RV systolic function, moderate RV enlargement, moderate-severe MR, moderate TR.   Had recent clinic f/u w/ Dr. [name] 12/10 and was volume overloaded w/ NYHA Class II symptoms. EKG showed NSR. Torsemide was increased to 80 qam/40 qpm, along w/ increase of KCl to 40 qam/20 qpm. Losartan 12.5 mg was also added. There was concern that her MR was  likely contributing to her CHF and repeat TEE was recommended to reassess valvular function/ severity of regurgitation but study not yet completed.   She presented to [facility] ED yesterday w/ complaints of fatigue and low BP and pulse rate at home, SBPs in the 70s and pulse rate as low as the 30s per pt report. She reported near syncope but no frank syncope. She did fall due to weakness but denies LOC. She has had recent CP that feels like her usual angina. Her exertional dyspnea however had improved w/ diuretic increase.  In the ED, HR was down into the 30s w/ BP of 98/48. O2 sats in the 90s on RA. EKG showed sinus bradycardia 50 bmp w/ LBBB. Per ED note, she did require doses of atropine. Labs notable for hyperkalemia at 5.2. Also w/ AKI w/ SCr at 2.28 (baseline SCr ~1-1.4). WBC 10. Hgb 9.9 c/w baseline. Hs troponin flat, 25>>25.  BNP 473. COVID negative. Home meds held on admit. Was on  blocker therapy, Toprol XL 50 mg PTA. Losartan, Arlyce Harman and torsemide also held. She was also given IVFs for hydration. SCr improving, down from 2.28>>1.97. K normal at 4.3 today. WBC 8.6. She is feeling better today. Out of bed and in chair. Still w/ sinus bradycardia in the low 50s. Her last dose of metoprolol was yesterday morning. Hypotension resolved. Currently asymptomatic.    Echo 2D 12/2018   1. Left ventricular ejection fraction, by visual estimation, is 45 to 50%. The left ventricle has mildly decreased function. There is no left ventricular hypertrophy. 2. Left ventricular diastolic parameters are indeterminate. 3. Septal and posterior lateral hypokinesis. 4. Global right ventricle has moderately reduced systolic function.The right ventricular size is moderately enlarged. No increase in right ventricular wall thickness. 5. Left atrial size was severely dilated. 6. Right atrial size was severely dilated. 7. Severe calcification of the mitral valve leaflet(s). 8. Severe mitral annular calcification. 9.  Severe thickening of the mitral valve leaflet(s). 10. The mitral valve is degenerative. Moderate to severe mitral valve regurgitation. No evidence of mitral stenosis. 11. The tricuspid valve is normal in structure. Tricuspid valve regurgitation moderate. 12. The aortic valve is tricuspid. Aortic valve regurgitation is not visualized. No evidence of aortic valve sclerosis or stenosis. 13. There is Moderate thickening of the aortic valve. 14. There is Sclerosis without any evidence of stenosis of the aortic valve. 15. The pulmonic valve was grossly normal. Pulmonic valve regurgitation is moderate. 16. Moderately elevated pulmonary artery systolic pressure. 17. The inferior vena cava is normal in size with greater than 50% respiratory variability, suggesting right atrial pressure of 3 mmHg.  Review of Systems: [y] = yes, _0  = no   . General: Weight gain _1 ; Weight loss _2 ; Anorexia _3 ; Fatigue _4 ; Fever _5 ; Chills _6 ; Weakness _7   . Cardiac: Chest pain/pressure _8 ; Resting SOB _9 ; Exertional SOB _10 ; Orthopnea _11 ; Pedal Edema _12 ; Palpitations _13 ; Syncope _14 ; Presyncope _15 ; Paroxysmal nocturnal dyspnea_16   . Pulmonary: Cough _17 ; Wheezing_18 ; Hemoptysis_19 ; Sputum _20 ; Snoring _21   .  GI: Vomiting_0 ; Dysphagia_1 ; Melena_2 ; Hematochezia _3 ; Heartburn_4 ; Abdominal pain _5 ; Constipation _6 ; Diarrhea _7 ; BRBPR _8   . GU: Hematuria_9 ; Dysuria _10 ; Nocturia_11   . Vascular: Pain in legs with walking _12 ; Pain in feet with lying flat _13 ; Non-healing sores _14 ; Stroke _15 ; TIA _16 ; Slurred speech _17 ;  . Neuro: Headaches_18 ; Vertigo_19 ; Seizures_20 ; Paresthesias_21 ;Blurred vision _22 ; Diplopia _23 ; Vision changes _24   . Ortho/Skin: Arthritis _25 ; Joint pain _26 ; Muscle pain _27 ; Joint swelling _28 ; Back Pain _29 ; Rash _30   . Psych: Depression_31 ; Anxiety_32   . Heme: Bleeding problems _33 ; Clotting disorders _34 ; Anemia _35   . Endocrine: Diabetes _36 ; Thyroid dysfunction_37   Home  Medications Prior to Admission medications   Medication Sig Start Date End Date Taking? Authorizing Provider  acetaminophen (TYLENOL) 500 MG tablet Take 1 tablet (500 mg total) by mouth every 6 (six) hours as needed for moderate pain or fever. Patient taking differently: Take 1,000 mg by mouth every 6 (six) hours as needed for moderate pain, fever or headache.  09/28/18  Yes Angiulli, Lavon Paganini, PA-C  albuterol (VENTOLIN HFA) 108 (90 Base) MCG/ACT inhaler Inhale 2 puffs into the lungs every 4 (four) hours as needed for wheezing or shortness of breath.    Yes [provider]  Alirocumab (PRALUENT) 150 MG/ML SOAJ Inject 1 pen into the skin every 14 (fourteen) days. Patient taking differently: Inject 150 mg into the skin every 14 (fourteen) days.  08/18/18  Yes Larey Dresser, MD  ALPRAZolam Duanne Moron) 0.5 MG tablet Take 1 tablet (0.5 mg total) by mouth 2 (two) times daily as needed for anxiety. Patient taking differently: Take 0.5-1 mg by mouth See admin instructions. Take one tablet (0.5 mg) by mouth every morning and two tablets (1 mg) at night, may also take 1 tablet (0.5 mg) midday as needed for anxiety 09/28/18  Yes Angiulli, Lavon Paganini, PA-C  apixaban (ELIQUIS) 5 MG TABS tablet Take 1 tablet (5 mg total) by mouth 2 (two) times daily. 09/28/18  Yes Angiulli, Lavon Paganini, PA-C  bisacodyl (DULCOLAX) 5 MG EC tablet Take 1 tablet (5 mg total) by mouth daily as needed for moderate constipation. 05/19/18 05/19/19 Yes Nicholes Stairs, MD  clopidogrel (PLAVIX) 75 MG tablet Take 75 mg by mouth at bedtime.   Yes [provider]  denosumab (PROLIA) 60 MG/ML SOSY injection Inject 60 mg into the skin every 6 (six) months.    Yes [provider]  DULoxetine (CYMBALTA) 30 MG capsule Take 1 capsule (30 mg total) by mouth daily. 09/28/18  Yes Angiulli, Lavon Paganini, PA-C  ezetimibe (ZETIA) 10 MG tablet Take 1 tablet (10 mg total) by mouth at bedtime. 04/10/18  Yes Cherene Altes, MD    Fluticasone-Salmeterol (ADVAIR) 250-50 MCG/DOSE AEPB Inhale 1 puff into the lungs daily.   Yes [provider]  gabapentin (NEURONTIN) 300 MG capsule Take 1 capsule (300 mg total) by mouth daily. Patient taking differently: Take 300-600 mg by mouth See admin instructions. 375m in the mornings and 6025mat bedtime 09/28/18  Yes Angiulli, DaLavon PaganiniPA-C  HYDROcodone-acetaminophen (NORCO/VICODIN) 5-325 MG tablet Take 1 tablet by mouth at bedtime as needed for moderate pain.   Yes [provider]  Insulin Glargine (LANTUS SOLOSTAR) 100 UNIT/ML Solostar Pen Inject 38-44 Units into the skin See admin instructions.  Inject 38 units subcutaneously daily at bedtime, increase to 44 units for CBG >200   Yes [provider]  insulin lispro (HUMALOG KWIKPEN) 100 UNIT/ML KwikPen Inject 14-18 Units into the skin See admin instructions. Inject 14 units subcutaneously prior to breakfast and supper; add 4 units for CBG >200   Yes [provider]  ipratropium-albuterol (DUONEB) 0.5-2.5 (3) MG/3ML SOLN Take 3 mLs by nebulization 2 (two) times daily. Patient taking differently: Take 3 mLs by nebulization 2 (two) times daily as needed (asthma).  04/10/18  Yes Cherene Altes, MD  losartan (COZAAR) 25 MG tablet Take 0.5 tablets (12.5 mg total) by mouth daily. Patient taking differently: Take 12.5 mg by mouth at bedtime.  01/11/19  Yes Larey Dresser, MD  metolazone (ZAROXOLYN) 2.5 MG tablet Take 1 tablet (2.5 mg total) by mouth daily as needed (As directed by heart failure clinic. Call heart failure office for direction when experiencing overnight weight gain). 12/26/18  Yes Eulogio Bear U, DO  metoprolol succinate (TOPROL-XL) 50 MG 24 hr tablet Take 1 tablet (50 mg total) by mouth daily. Take with or immediately following a meal. 09/28/18  Yes Angiulli, Lavon Paganini, PA-C  Multiple Vitamin (MULTIVITAMIN WITH MINERALS) TABS tablet Take 1 tablet by mouth every morning. Centrum - Women over 58    Yes [provider]  Multiple Vitamins-Minerals ([place]azen) CAPS Take 1 capsule by mouth every morning.   Yes [provider]  nitroGLYCERIN (NITROSTAT) 0.3 MG SL tablet Place 1 tablet (0.3 mg total) under the tongue every 5 (five) minutes x 3 doses as needed for chest pain. 09/15/17  Yes Georgiana Shore, NP  ondansetron (ZOFRAN-ODT) 4 MG disintegrating tablet Take 4 mg by mouth 2 (two) times daily as needed for nausea or vomiting.   Yes [provider]  OXYGEN Inhale 2 L into the lungs at bedtime as needed (shortness of breath).    Yes [provider]  pantoprazole (PROTONIX) 40 MG tablet Take 1 tablet (40 mg total) by mouth daily. 09/28/18  Yes Angiulli, Lavon Paganini, PA-C  Polyvinyl Alcohol-Povidone (REFRESH OP) Place 1 drop into both eyes daily as needed (redness/ dry eyes).   Yes [provider]  potassium chloride SA (KLOR-CON) 20 MEQ tablet Take 2 tablets by mouth every morning and 1 tablet by mouth every evening Patient taking differently: Take 20 mEq by mouth 2 (two) times daily.  01/11/19  Yes Larey Dresser, MD  Pyridoxine HCl (VITAMIN B-6 PO) Take 1 tablet by mouth every morning.   Yes [provider]  ranolazine (RANEXA) 500 MG 12 hr tablet Take 1 tablet (500 mg total) by mouth 2 (two) times daily. 09/28/18  Yes Angiulli, Lavon Paganini, PA-C  rosuvastatin (CRESTOR) 40 MG tablet Take 1 tablet (40 mg total) by mouth at bedtime. 09/28/18  Yes Angiulli, Lavon Paganini, PA-C  spironolactone (ALDACTONE) 25 MG tablet Take 1 tablet (25 mg total) by mouth daily. Patient taking differently: Take 25 mg by mouth at bedtime.  11/13/18  Yes Larey Dresser, MD  torsemide (DEMADEX) 20 MG tablet Take 4 tablets (80 mg) by mouth every morning, take 2 tablets (40 mg) every evening 01/11/19  Yes Larey Dresser, MD  traZODone (DESYREL) 50 MG tablet Take 1 tablet (50 mg total) by mouth at bedtime. 01/12/19  Yes Kathrynn Ducking, MD  triamcinolone  cream (KENALOG) 0.1 % Apply 1 application topically daily as needed (crusty spots on arms).   Yes [provider]  vitamin B-12 (CYANOCOBALAMIN) 1000 MCG tablet Take 1,000 mcg by mouth every morning.    Yes [provider]  zolpidem (AMBIEN) 10 MG tablet Take 0.5 tablets (5 mg total) by mouth at bedtime. 12/26/18  Yes Geradine Girt, DO    Past Medical History: Past Medical History:  Diagnosis Date  . Anemia    hx  . Anxiety   . Asthma   . Basal cell carcinoma 05/2014   "left shoulder"  . Bundle branch block, left    chronic/notes 07/18/2013  . CHF (congestive heart failure) ([place])   . Chronic insomnia 05/06/2015  . Chronic kidney disease    frequency, sees dr Jamal Maes every 4 to 6 months (01/16/2018)  . Chronic lower back pain   . Claustrophobia   . Common migraine 05/14/2014  . Coronary artery disease    [name] in 2001, 2002, 2006, 2011, 2014  . Depression   . Diabetic peripheral neuropathy ([place]) 01/12/2019  . GERD (gastroesophageal reflux disease)   . H/O hiatal hernia   . Headache    "at least 2/month" (01/16/2018)  . Heart murmur   . Hyperlipidemia   . Hypertension   . Memory change 05/14/2014  . Migraine    "5-6/year"  (01/16/2018)  . Obesity 01-2010  . Obstructive sleep apnea    "can't wear machine; I have claustrophobia" (01/16/2018), states she had a 2nd sleep study and does not have sleep apnea, her O2 decreases and now is on 2 L of O2 at night.  . On home oxygen therapy    "2L at night and prn during daytime" (01/16/2018)  . Osteoarthritis    "knees and hands" (01/16/2018)  . Peripheral vascular disease (HCC)    ? numbness, tingling arms and legs  . PONV (postoperative nausea and vomiting)   . Stroke [facility])    2014, 2015, 2016  . Type II diabetes mellitus ([place])   . Ventral hernia    hx of    Past Surgical History: Past Surgical History:  Procedure Laterality Date  . ANKLE FRACTURE SURGERY Left 1970's  . APPENDECTOMY  1970's    w/hysterectomy  . BASAL CELL CARCINOMA EXCISION Left 05/2014   "shoulder" (01/16/2018)  . CARDIAC CATHETERIZATION  10/10/2012   Dr [name].  Marland Kitchen CARDIAC CATHETERIZATION N/A 05/29/2015   Procedure: Right/Left Heart Cath and Coronary/Graft Angiography;  Surgeon: Larey Dresser, MD;  Location: [facility];  Service: Cardiovascular;  Laterality: N/A;  . CAROTID ENDARTERECTOMY Left 03/2013  . CAROTID STENT INSERTION Left 03/20/2013   Procedure: CAROTID STENT INSERTION;  Surgeon: [name], MD;  Location: [facility] CATH LAB;  Service: Cardiovascular;  Laterality: Left;  internal carotid  . CEREBRAL ANGIOGRAM N/A 04/05/2011   Procedure: CEREBRAL ANGIOGRAM;  Surgeon: Angelia Mould, MD;  Location: [facility] CATH LAB;  Service: Cardiovascular;  Laterality: N/A;  . CHOLECYSTECTOMY OPEN  2004  . CORONARY ANGIOPLASTY WITH STENT PLACEMENT  01,02,05,06,07,08,11; 04/24/2013   "I've probably got ~ 10 stents by now" (04/24/2013)  . CORONARY ANGIOPLASTY WITH STENT PLACEMENT  06/13/2013   "got 4 stents today" (06/13/2013)  . CORONARY ARTERY BYPASS GRAFT  1220/11   "CABG X5"  . CORONARY STENT INTERVENTION N/A 10/20/2017   Procedure: CORONARY STENT INTERVENTION;  Surgeon: Troy Sine, MD;  Location: [place] [facility];  Service: Cardiovascular;  Laterality: N/A;  . ESOPHAGOGASTRODUODENOSCOPY  08/03/2011   Procedure: ESOPHAGOGASTRODUODENOSCOPY (EGD);  Surgeon: Shann Medal, MD;  Location: [facility];  Service: General;  Laterality: N/A;  . ESOPHAGOGASTRODUODENOSCOPY (EGD) WITH PROPOFOL N/A 03/11/2014   Procedure: ESOPHAGOGASTRODUODENOSCOPY (EGD) WITH PROPOFOL;  Surgeon: Lafayette Dragon, MD;  Location: [facility] ENDOS[facility];  Service: Endoscopy;  Laterality: N/A;  . FRACTURE SURGERY    . gall stone removal  05/2003  . GASTRIC RESTRICTION SURGERY  1984   "stapeling"  . HERNIA REPAIR  2004   "in my stomach; had OR on it twice", wire mesh on 1 hernia  . LEFT HEART CATH AND CORS/GRAFTS ANGIOGRAPHY N/A 07/09/2016   Procedure:  LEFT HEART CATH AND CORS/GRAFTS ANGIOGRAPHY;  Surgeon: Larey Dresser, MD;  Location: [place] [facility];  Service: Cardiovascular;  Laterality: N/A;  . ORIF ANKLE FRACTURE Left 05/16/2018  . ORIF ANKLE FRACTURE Left 05/16/2018   Procedure: OPEN REDUCTION INTERNAL FIXATION (ORIF) Left ankle with possible syndesmosis fixation;  Surgeon: Nicholes Stairs, MD;  Location: [facility];  Service: Orthopedics;  Laterality: Left;  157mn  . OVARY SURGERY  1970's   "tumor removed"  . PERCUTANEOUS CORONARY STENT INTERVENTION (PCI-S) N/A 06/13/2013   Procedure: PERCUTANEOUS CORONARY STENT INTERVENTION (PCI-S);  Surgeon: JJettie Booze MD;  [facility];  Service: Cardiovascular;  Laterality: N/A;  . PERCUTANEOUS STENT INTERVENTION N/A 04/24/2013   Procedure: PERCUTANEOUS STENT INTERVENTION;  Surgeon: JJettie Booze MD;  [facility];  Service: Cardiovascular;  Laterality: N/A;  . RIGHT/LEFT HEART CATH AND CORONARY/GRAFT ANGIOGRAPHY N/A 10/20/2017   Procedure: RIGHT/LEFT HEART CATH AND CORONARY/GRAFT ANGIOGRAPHY;  Surgeon: MLarey Dresser MD;  [facility];  Service: Cardiovascular;  Laterality: N/A;  . ROOT CANAL  10/2000  . TIBIA FRACTURE SURGERY Right 1970's   rods and pins  . TOOTH EXTRACTION     "1 on the upper; wisdom tooth on the lower" (01/16/2018)  . TOTAL ABDOMINAL HYSTERECTOMY  1970's   w/ appendectomy    Family History: Family History  Problem Relation Age of Onset  . Heart disease Father        Heart Disease before age 306 . Diabetes Father   . Hyperlipidemia Father   . Hypertension Father   . Heart attack Father   . Deep vein thrombosis Father   . AAA (abdominal aortic aneurysm) Father   . Hypertension Mother   . Deep vein thrombosis Mother   . AAA (abdominal aortic aneurysm) Mother   . Cancer Sister        Ovarian  . Hypertension Sister   . Diabetes Paternal Grandmother   . Heart disease Paternal Uncle   . Diabetes Paternal Uncle    . Heart attack Paternal Grandfather 587      died of [name] at 58 . Diabetes Paternal Aunt   . Diabetes Paternal Uncle   . Colon cancer Neg Hx     Social History: Social History   Socioeconomic History  . Marital status: Married    Spouse name: DShanon Brow  . Number of children: 1  . Years of education: 12+  . Highest education level: Not on file  Occupational History  . Occupation: retired  Tobacco Use  . Smoking status: Never Smoker  . Smokeless tobacco: Never Used  Substance and Sexual Activity  . Alcohol use: Never  . Drug use: Never  . Sexual activity: Not Currently    Birth control/protection: Surgical    Comment: hysterectomy  Other Topics Concern  . Not on file  Social History Narrative   Patient lives at home with husband DShanon Brow  Patient has 1 child.    Patient is not currently working.    Patient has 2 years of college.    Patient is right handed.   Patient drinks caffeine occasionally.   Social Determinants of Health   Financial Resource Strain:   . Difficulty of Paying Living Expenses: Not on file  Food Insecurity:   . Worried About Charity fundraiser in the Last Year: Not on file  . Ran Out of Food in the Last Year: Not on file  Transportation Needs:   . Lack of Transportation (Medical): Not on file  . Lack of Transportation (Non-Medical): Not on file  Physical Activity:   . Days of Exercise per Week: Not on file  . Minutes of Exercise per Session: Not on file  Stress:   . Feeling of Stress : Not on file  Social Connections:   . Frequency of Communication with Friends and Family: Not on file  . Frequency of Social Gatherings with Friends and Family: Not on file  . Attends Religious Services: Not on file  . Active Member of Clubs or Organizations: Not on file  . Attends Archivist Meetings: Not on file  . Marital Status: Not on file    Allergies:  Allergies  Allergen Reactions  . Amoxicillin Shortness Of Breath and Rash  . Brilinta  [Ticagrelor] Shortness Of Breath  . Erythromycin Shortness Of Breath, Other (See Comments) and Hives    Trouble swallowing  . Flagyl [Metronidazole] Shortness Of Breath and Palpitations  . Penicillins Hives, Shortness Of Breath, Rash and Other (See Comments)    Has patient had a PCN reaction causing immediate rash, facial/tongue/throat swelling, SOB or lightheadedness with hypotension: Yes Has patient had a PCN reaction causing severe rash involving mucus membranes or skin necrosis: No Has patient had a PCN reaction that required hospitalization: Yes Has patient had a PCN reaction occurring within the last 10 years: No If all of the above answers are "NO", then may proceed with Cephalosporin use.   . Isosorbide Mononitrate [Isosorbide Nitrate] Other (See Comments)    Joint aches, muscles hurt, difficult to walk   . Jardiance [Empagliflozin] Other (See Comments)    Nausea, joint aches, muscles aches  . Metformin And Related Other (See Comments)    Stomach pain, cold sweats, joint pain, burred vision, dizziness  . Tape Other (See Comments)    Skin pulls off with certain types Plastic tape causes skin to rip if left on for long periods of time  . Erythromycin Base Rash    Objective:    Vital Signs:   Temp:  [96.6 F (35.9 C)-97.8 F (36.6 C)] 97.8 F (36.6 C) (12/16 1033) Pulse Rate:  [33-51] 51 (12/16 1033) Resp:  [14-25] 18 (12/16 1033) BP: (98-137)/(44-99) 137/44 (12/16 1033) SpO2:  [92 %-100 %] 99 % (12/16 1033) Weight:  [106.9 kg] 106.9 kg (12/16 0351) Last BM Date: 01/16/19  Weight change: Filed Weights   01/17/19 0351  Weight: 106.9 kg    Intake/Output:   Intake/Output Summary (Last 24 hours) at 01/17/2019 1211 Last data filed at 01/17/2019 1009 Gross per 24 hour  Intake 1503 ml  Output 1000 ml  Net 503 ml      Physical Exam    General:  Well appearing WF. No resp difficulty HEENT: normal Neck: supple. Elevated JVP to ear . Carotids 2+ bilat; no bruits.  No lymphadenopathy or thyromegaly appreciated. Cor: PMI nondisplaced. Regular rhythm, slow rate. 3/6 MR murmur best heard  at the apex Lungs: clear Abdomen: soft, nontender, nondistended. No hepatosplenomegaly. No bruits or masses. Good bowel sounds. Extremities: no cyanosis, clubbing, rash, edema Neuro: alert & orientedx3, cranial nerves grossly intact. moves all 4 extremities w/o difficulty. Affect pleasant   Telemetry   Sinus bradycardia low 50s. No high grade AV block.   EKG    Sinus bradycardia 50 bpm w/ LBBB  Labs   Basic Metabolic Panel: Recent Labs  Lab 01/11/19 1625 01/16/19 1645 01/17/19 0535  NA 140 136 139  K 3.5 5.2* 4.3  CL 95* 97* 101  CO2 _0 GLUCOSE 125* 141* 184*  BUN 19 40* 37*  CREATININE 1.46* 2.28* 1.97*  CALCIUM 9.6 9.0 9.0    Liver Function Tests: No results for input(s): AST, ALT, ALKPHOS, BILITOT, PROT, ALBUMIN in the last 168 hours. No results for input(s): LIPASE, AMYLASE in the last 168 hours. No results for input(s): AMMONIA in the last 168 hours.  CBC: Recent Labs  Lab 01/16/19 1645 01/17/19 0535  WBC 10.8* 8.6  HGB 9.9* 9.4*  HCT 34.5* 31.4*  MCV 85.4 82.2  PLT 217 187    Cardiac Enzymes: No results for input(s): CKTOTAL, CKMB, CKMBINDEX, TROPONINI in the last 168 hours.  BNP: BNP (last 3 results) Recent Labs    09/10/18 0925 09/18/18 0627 01/11/19 1600  BNP >4,500.0* 226.7* 473.7*    ProBNP (last 3 results) No results for input(s): PROBNP in the last 8760 hours.   CBG: Recent Labs  Lab 01/17/19 0026 01/17/19 0617 01/17/19 1035  GLUCAP 120* 189* 149*    Coagulation Studies: No results for input(s): LABPROT, INR in the last 72 hours.   Imaging   DG Chest Portable 1 View  Result Date: 01/16/2019 CLINICAL DATA:  Weakness. EXAM: PORTABLE CHEST 1 VIEW COMPARISON:  09/18/2018 FINDINGS: Stable enlargement of the cardiopericardial silhouette. Stable changes from prior CABG surgery. No mediastinal or  hilar masses. Clear lungs.  No pleural effusion or pneumothorax. Skeletal structures are grossly intact. IMPRESSION: 1. No acute cardiopulmonary disease. 2. Stable cardiomegaly. Electronically Signed   By: Lajean Manes M.D.   On: 01/16/2019 17:19      Medications:     Current Medications: . apixaban  5 mg Oral BID  . [START ON 01/18/2019] Chlorhexidine Gluconate Cloth  6 each Topical Q0600  . clopidogrel  75 mg Oral Daily  . furosemide  60 mg Intravenous Once  . [START ON 01/18/2019] influenza vaccine adjuvanted  0.5 mL Intramuscular Tomorrow-1000  . insulin aspart  0-5 Units Subcutaneous QHS  . insulin aspart  0-9 Units Subcutaneous TID WC  . insulin glargine  15 Units Subcutaneous QHS  . mupirocin ointment  1 application Nasal BID  . [START ON 01/18/2019] pneumococcal 23 valent vaccine  0.5 mL Intramuscular Tomorrow-1000  . rosuvastatin  40 mg Oral q1800  . sodium chloride flush  3 mL Intravenous Q12H  . sodium chloride flush  3 mL Intravenous Q12H     Infusions: . sodium chloride         Patient Profile   [name] [name] is a 73 y.o. female who has a history of CAD s/p CABG and multiple PCIs, chronic combined systolic and diastolic CHF (last EF 03-70%), mitral regurgitation and cerebrovascular disease with history of CVA and left carotid stent, admitted for hypotension, bradycardia and AKI.    Assessment/Plan   1. Bradycardia: bradycardic on admit w/ HR in the 30s in the ED w/ associated hypotension w/ SBP in the  70s, requiring atropine. EKG shows sinus brady 50 bpm w/ LBBB (chronic). No high grade arrhthymias detected on tele. Was on  blocker therapy w/ Toprol XL 50 mg daily PTA (last dose 12/15 AM). Held on admit. K 5.2 in the setting of AKI but improved today at 4.3. Remains sinus brady in the low 50s - continue  blocker washout - will check TSH - She has known CAD w/ h/o CABG and previous RCA PCI and reports recent CP c/w prior anginal pain but Hs troponin is flat,  25>>25. Given no high grade AV block, doubt coronary ischemia to be primary cause of bradycardia. Will continue to assess w/  blocker washout.  - no indication for PPM at this time. Can consider 30 day outpatient monitor at d/c  2. Hypotension: resolved. Suspect likely 2/2 over diuresis after recent diuretic dose adjustment + addition of Losartan. SCr 2.28 and BUN 40 on admit. Home diuretics/ antihypertensives held on admit  and IVF given w/ improvement in renal indices and BP.  - now volume up. Will give IV Lasix 60 mg x 1.  - Monitor volume status closely and will need to gradually add back HF meds as renal function allows  3. Acute on Chronic Combined Systolic and Diastolic CHF: Echo in 1/28 with EF 55-60%, moderate diastolic dysfunction.Cardiomems placed 01/16/18.  Echo in 3/20 in setting of severe PNA with intubation showed EF 25-30%, mid-apical LV severe hypokinesis.  Possible stress (Takotsubo-type) cardiomyopathy related to severe medical illness versus progression of CAD.  She has baseline chronic LBBB which could play a role in cardiomyopathy as well (LBBB cardiomyopathy).  Repeat echo in 11/20 with EF up to 45-50%, moderately dysfunctional RV. - diuretics, losartan and spiro held for hypotension and AKI.  blocker on hold for bradycardia - received IVFs for hypotension and AKI. No volume up. Will give IV Lasix 60 mg x 1.  - she has cardiomems implant. Will see if we can use to help evaluate fluid status during admission - plan to resume HF meds once renal function improves - monitor volume status closely. Strict I/Os and daily wts  4. Mitral Regurgitation: Moderate to severe MR noted on recent TTE 11/20. With multiple exacerbations and readmits for CHF, will need TEE to better assess severity and consideration of MV clip if severe. Plan TEE tomorrow.   5. AKI: Suspect likely 2/2 over diuresis after recent diuretic dose adjustment + recent addition of Losartan. SCr 2.28 and BUN 40 on  admit. Home diuretics/ antihypertensives remain on hold and IVF given w/ improvement in renal indices. Now volume up. Will give IV Lasix 60 mg x 1.  - continue to monitor  6. Hypokalemia: 5.2 on admit. Resolved. 4.3 today - Plan to resume spiro and losartan if renal function normalizes. Will need to reduce dose of KCl on d/c  7. CAD: She has known CAD w/ h/o CABG and multiple PCIs. H/o chronic stable angina. Hs troponin trend flat, 25>>25. Currently CP free.  - continue Plavix + statin - continue to hold  blocker   Length of Stay: 0  Lyda Jester, PA-C  01/17/2019, 12:11 PM  Advanced Heart Failure Team Pager [phone] (M-F; 7a - 4p)  Please contact [facility] for night-coverage after hours (4p -7a ) and weekends on [email or web address]  Patient seen with PA, agree with the above note.    She was admitted with hypotension (SBP 70s) and bradycardia, appears to be sinus brady in 30s at admission with PACs. She had AKI  with hyperkalemia. She did not have worrisome chest pain and troponin was not significantly elevated.   Toprol XL, losartan, spironolactone, and torsemide were held. She was given IV fluid.  Today, creatinine down to 1.97 and K is normal.  She is now in sinus brady in 50s with IVCD.  BP now stable.   General: NAD Neck: JVP 12 cm, no thyromegaly or thyroid nodule.  Lungs: Clear to auscultation bilaterally with normal respiratory effort. CV: Nondisplaced PMI.  Heart regular S1/S2, no S3/S4, 2/6 HSM apex.  Trace ankle edema.  No carotid bruit.  Normal pedal pulses.  Abdomen: Soft, nontender, no hepatosplenomegaly, no distention.  Skin: Intact without lesions or rashes.  Neurologic: Alert and oriented x 3.  Psych: Normal affect. Extremities: No clubbing or cyanosis.  HEENT: Normal.   Sinus bradycardia improving off Toprol XL, continue to hold.  HR in 50s with baseline IVCD.  Do not think we need to involve EP yet, continue to monitor off beta blocker.   AKI resolving with  hydration and hold torsemide, losartan, spironolactone.  Continue to hold losartan and spironolactone.   She is now volume overloaded on exam.  Will give Lasix 60 mg IV x 1 and follow response.  She will likely need further IV Lasix.   Planned for TEE as outpatient tomorrow to assess mitral regurgitation, will plan to do it as inpatient.  NPO at midnight.   She will continue on Plavix in addition to Eliquis, Dr. Jannifer Franklin wants her to continue for cerebrovascular disease.   Loralie Champagne 01/17/2019 3:33 PM

## 2019-01-17 NOTE — TOC Initial Note (Signed)
Transition of Care [facility]) - Initial/Assessment Note    Patient Details  Name: [name] [name] [name]: [id number] Date of Birth: 26-Aug-1945  [facility] CM/SW Contact:    Zenon Mayo, RN Phone Number: 01/17/2019, 4:05 PM  Clinical Narrative:                 Patient from home with spouse,  NCM offered choice ,she is active with [facility] for [facility] and HHPT and states she would like to continue with them.  NCM notified [place] with [employer].  Will need Orders with face to face prior to dc. She states she has home oxygen with [employer] ,2 liters.   Expected Discharge Plan: [place] Barriers to Discharge: No Barriers Identified   Patient Goals and CMS Choice Patient states their goals for this hospitalization and ongoing recovery are:: stay home and out of the hospital CMS Medicare.gov Compare Post Acute Care list provided to:: Patient Choice offered to / list presented to : Patient  Expected Discharge Plan and Services Expected Discharge Plan: [place] In-house Referral: NA Discharge Planning Services: CM Consult Post Acute Care Choice: Home Health, Resumption of Svcs/PTA Provider Living arrangements for the past 2 months: Single Family Home                 DME Arranged: (NA)         HH Arranged: RN, PT [place] Agency: [facility] [place] [place]        Prior Living Arrangements/Services Living arrangements for the past 2 months: Single Family Home Lives with:: Spouse Patient language and need for interpreter reviewed:: Yes Do you feel safe going back to the place where you live?: Yes      Need for Family Participation in Patient Care: Yes (Comment) Care giver support system in place?: Yes (comment) Current home services: Home PT, Home RN, DME(has home oxygen with [employer] 2 liters.) Criminal Activity/Legal Involvement Pertinent to Current Situation/Hospitalization: No - Comment as needed  Activities of Daily Living Home Assistive  Devices/Equipment: Walker (specify type) ADL Screening (condition at time of admission) Patient's cognitive ability adequate to safely complete daily activities?: Yes Is the patient deaf or have difficulty hearing?: No Does the patient have difficulty seeing, even when wearing glasses/contacts?: No Does the patient have difficulty concentrating, remembering, or making decisions?: No Patient able to express need for assistance with ADLs?: Yes Does the patient have difficulty dressing or bathing?: No Independently performs ADLs?: Yes (appropriate for developmental age) Does the patient have difficulty walking or climbing stairs?: Yes Weakness of Legs: Both Weakness of Arms/Hands: None  Permission Sought/Granted                  Emotional Assessment Appearance:: Appears stated age Attitude/Demeanor/Rapport: Engaged Affect (typically observed): Appropriate     Psych Involvement: No (comment)  Admission diagnosis:  Bradycardia [R00.1] Weakness [R53.1] Symptomatic bradycardia [R00.1] Patient Active Problem List   Diagnosis Date Noted  . Symptomatic bradycardia 01/16/2019  . Normocytic anemia 01/16/2019  . Diabetic peripheral neuropathy ([place]) 01/12/2019  . Neurologic abnormality 12/25/2018  . Debility 09/21/2018  . Hypotension 09/18/2018  . Bradycardia 09/18/2018  . Acute renal failure superimposed on stage 3 chronic kidney disease ([place]) 09/18/2018  . Pressure injury of skin 09/18/2018  . Ischemic cerebrovascular accident (CVA) of frontal lobe ([place]) 09/13/2018  . Fall 09/09/2018  . History of CVA (cerebrovascular accident) 09/09/2018  . Type II diabetes mellitus with renal manifestations ([place]) 09/09/2018  .  CKD (chronic kidney disease), stage III 09/09/2018  . Sepsis ([place]) 09/09/2018  . Depression with anxiety 09/09/2018  . Slurred speech 09/09/2018  . Ankle fracture, left 05/16/2018  . Amiodarone pulmonary toxicity   . Physical deconditioning   . 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 Acute on chronic congestive heart failure ([place])   . Chronic combined systolic (congestive) and diastolic (congestive) heart failure ([place]) 01/16/2018  . Chronic systolic CHF (congestive heart failure) ([place]) 01/16/2018  . Atrial fibrillation ([place]) 10/21/2017  . CAD (coronary artery disease) of bypass graft 10/20/2017  . Coronary artery disease of bypass graft of native heart with stable angina pectoris ([place])   . Chronic low back pain 08/24/2016  . Pain of left thumb 08/05/2016  . Nocturnal hypoxia 06/02/2016  . Hoarseness 04/05/2016  . Laryngopharyngeal reflux (LPR) 04/05/2016  . Pharyngoesophageal dysphagia 04/05/2016  . Angina decubitus ([place]) 05/22/2015  . Chronic insomnia 05/06/2015  . Common migraine 05/14/2014  . Abnormality of gait 05/14/2014  . Memory change 05/14/2014  . Aneurysm, cerebral, nonruptured 05/14/2014  . Cramp of limb-Left neck 05/10/2014  . Hematemesis with nausea   . Vomiting blood   . Dizziness and giddiness 09/21/2013  . Atypical chest pain 07/18/2013  . Unstable angina ([place]) 04/09/2013  . Carotid artery stenosis, symptomatic 03/20/2013  . Cerebrovascular disease 07/10/2012  . Cerebral artery occlusion with cerebral infarction ([place]) 07/10/2012  . Mitral regurgitation 04/15/2012  . TIA (transient ischemic attack) 04/15/2012  . Occlusion and stenosis of carotid artery without mention of cerebral infarction 08/18/2011  . Bariatric surgery status 06/17/2011  . Speech abnormality 03/22/2011  . Dyspnea 02/24/2011  . PAPILLARY MUSCLE DYSFUNCTION, NON-RHEUMATIC 10/09/2008  . UNSPECIFIED VITAMIN D DEFICIENCY 10/24/2007  . MYOCARDIAL INFARCTION, HX OF 10/24/2007  . PERSISTENT VOMITING 10/24/2007  . OSTEOARTHRITIS 10/24/2007  . MIGRAINES, HX OF 10/24/2007  . Diabetes mellitus type 2, controlled, with complications ([place]) 28/00/3491  . Hyperlipemia 01/16/2007  . Obesity-post failed open gastroplasty 1984  01/16/2007  . OBSTRUCTIVE SLEEP APNEA  01/16/2007  . Essential hypertension 01/16/2007  . Coronary atherosclerosis 01/16/2007  . Asthma 01/16/2007  . GERD 01/16/2007  . VENTRAL HERNIA 01/16/2007   PCP:  Derinda Late, MD Pharmacy:   [place], [place] - [address] [address] Phone: [phone] Fax: [phone]  [place], [address] [place] [address] Phone: [phone] Fax: [phone]     Social Determinants of Health (SDOH) Interventions    Readmission Risk Interventions Readmission Risk Prevention Plan 01/17/2019 05/17/2018  Transportation Screening Complete Complete  PCP or Specialist Appt within 3-5 Days - Complete  HRI or [place] - Complete  Social Work Consult for [place] Planning/Counseling - Complete  Palliative Care Screening - Not Applicable  Medication Review Press photographer) Complete Complete  PCP or Specialist appointment within 3-5 days of discharge Complete -  [place] or Home Care Consult Complete -  SW Recovery Care/Counseling Consult Complete -  Palliative Care Screening Not Applicable -  [place] Not Applicable -  Some recent data might be hidden

## 2019-01-18 ENCOUNTER — Encounter (HOSPITAL_COMMUNITY)
Admission: EM | Disposition: A | Discharge status: Home Health | Payer: Self-pay | Source: Home / Self Care | Attending: Internal Medicine

## 2019-01-18 ENCOUNTER — Encounter (HOSPITAL_COMMUNITY): Payer: Self-pay | Admitting: Internal Medicine

## 2019-01-18 ENCOUNTER — Inpatient Hospital Stay (HOSPITAL_COMMUNITY)
Admit: 2019-01-18 | Discharge: 2019-01-18 | Disposition: A | Discharge status: Home / Self Care | Payer: Medicare Other | Attending: Cardiology | Admitting: Cardiology

## 2019-01-18 ENCOUNTER — Ambulatory Visit (HOSPITAL_COMMUNITY): Admission: RE | Admit: 2019-01-18 | Payer: Medicare Other | Source: Ambulatory Visit | Admitting: Cardiology

## 2019-01-18 DIAGNOSIS — I340 Nonrheumatic mitral (valve) insufficiency: Secondary | ICD-10-CM

## 2019-01-18 DIAGNOSIS — N179 Acute kidney failure, unspecified: Secondary | ICD-10-CM

## 2019-01-18 DIAGNOSIS — I25708 Atherosclerosis of coronary artery bypass graft(s), unspecified, with other forms of angina pectoris: Secondary | ICD-10-CM

## 2019-01-18 HISTORY — PX: TEE WITHOUT CARDIOVERSION: SHX5443

## 2019-01-18 LAB — CBC WITH DIFFERENTIAL/PLATELET
Abs Immature Granulocytes: 0.03 10*3/uL (ref 0.00–0.07)
Basophils Absolute: 0.1 10*3/uL (ref 0.0–0.1)
Basophils Relative: 1 %
Eosinophils Absolute: 0.5 10*3/uL (ref 0.0–0.5)
Eosinophils Relative: 4 %
HCT: 32.3 % — ABNORMAL LOW (ref 36.0–46.0)
Hemoglobin: 9.7 g/dL — ABNORMAL LOW (ref 12.0–15.0)
Immature Granulocytes: 0 %
Lymphocytes Relative: 13 %
Lymphs Abs: 1.5 10*3/uL (ref 0.7–4.0)
MCH: 24.3 pg — ABNORMAL LOW (ref 26.0–34.0)
MCHC: 30 g/dL (ref 30.0–36.0)
MCV: 80.8 fL (ref 80.0–100.0)
Monocytes Absolute: 1 10*3/uL (ref 0.1–1.0)
Monocytes Relative: 9 %
Neutro Abs: 8.6 10*3/uL — ABNORMAL HIGH (ref 1.7–7.7)
Neutrophils Relative %: 73 %
Platelets: 188 10*3/uL (ref 150–400)
RBC: 4 MIL/uL (ref 3.87–5.11)
RDW: 18.7 % — ABNORMAL HIGH (ref 11.5–15.5)
WBC: 11.6 10*3/uL — ABNORMAL HIGH (ref 4.0–10.5)
nRBC: 0 % (ref 0.0–0.2)

## 2019-01-18 LAB — GLUCOSE, CAPILLARY
Glucose-Capillary: 169 mg/dL — ABNORMAL HIGH (ref 70–99)
Glucose-Capillary: 168 mg/dL — ABNORMAL HIGH (ref 70–99)
Glucose-Capillary: 165 mg/dL — ABNORMAL HIGH (ref 70–99)
Glucose-Capillary: 221 mg/dL — ABNORMAL HIGH (ref 70–99)

## 2019-01-18 LAB — BASIC METABOLIC PANEL
Anion gap: 9 (ref 5–15)
BUN: 30 mg/dL — ABNORMAL HIGH (ref 8–23)
CO2: 28 mmol/L (ref 22–32)
Calcium: 9 mg/dL (ref 8.9–10.3)
Chloride: 101 mmol/L (ref 98–111)
Creatinine, Ser: 1.46 mg/dL — ABNORMAL HIGH (ref 0.44–1.00)
GFR calc Af Amer: 42 mL/min — ABNORMAL LOW (ref >60)
GFR calc non Af Amer: 36 mL/min — ABNORMAL LOW (ref >60)
Glucose, Bld: 152 mg/dL — ABNORMAL HIGH (ref 70–99)
Potassium: 4.2 mmol/L (ref 3.5–5.1)
Sodium: 138 mmol/L (ref 135–145)

## 2019-01-18 LAB — UREA NITROGEN, URINE: Urea Nitrogen, Ur: 242 mg/dL

## 2019-01-18 LAB — MAGNESIUM: Magnesium: 2.2 mg/dL (ref 1.7–2.4)

## 2019-01-18 SURGERY — ECHOCARDIOGRAM, TRANSESOPHAGEAL
Anesthesia: Moderate Sedation

## 2019-01-18 MED ORDER — TORSEMIDE 20 MG PO TABS
80.0000 mg | ORAL_TABLET | Freq: Every day | ORAL | Status: DC
  Administered 2019-01-18 – 2019-01-19 (×2): 80 mg via ORAL
  Filled 2019-01-18 (×2): qty 4

## 2019-01-18 MED ORDER — BUTAMBEN-TETRACAINE-BENZOCAINE 2-2-14 % EX AERO
INHALATION_SPRAY | CUTANEOUS | Status: DC | PRN
  Administered 2019-01-18: 13:00:00 2 via TOPICAL

## 2019-01-18 MED ORDER — LOSARTAN POTASSIUM 25 MG PO TABS
12.5000 mg | ORAL_TABLET | Freq: Every day | ORAL | Status: DC
  Administered 2019-01-18 – 2019-01-19 (×2): 12.5 mg via ORAL
  Filled 2019-01-18 (×2): qty 1

## 2019-01-18 MED ORDER — DIPHENHYDRAMINE HCL 50 MG/ML IJ SOLN
INTRAMUSCULAR | Status: AC
  Filled 2019-01-18: qty 1

## 2019-01-18 MED ORDER — FUROSEMIDE 10 MG/ML IJ SOLN
60.0000 mg | Freq: Once | INTRAMUSCULAR | Status: AC
  Administered 2019-01-18: 60 mg via INTRAVENOUS
  Filled 2019-01-18: qty 6

## 2019-01-18 MED ORDER — DIPHENHYDRAMINE HCL 50 MG/ML IJ SOLN
12.5000 mg | Freq: Once | INTRAMUSCULAR | Status: AC
  Administered 2019-01-18: 12.5 mg via INTRAVENOUS
  Filled 2019-01-18: qty 1

## 2019-01-18 MED ORDER — DIPHENHYDRAMINE HCL 50 MG/ML IJ SOLN
INTRAMUSCULAR | Status: DC | PRN
  Administered 2019-01-18: 25 mg via INTRAVENOUS

## 2019-01-18 MED ORDER — MIDAZOLAM HCL (PF) 10 MG/2ML IJ SOLN
INTRAMUSCULAR | Status: DC | PRN
  Administered 2019-01-18 (×3): 2 mg via INTRAVENOUS
  Administered 2019-01-18: 1 mg via INTRAVENOUS
  Administered 2019-01-18: 2 mg via INTRAVENOUS

## 2019-01-18 MED ORDER — MIDAZOLAM HCL (PF) 5 MG/ML IJ SOLN
INTRAMUSCULAR | Status: AC
  Filled 2019-01-18: qty 2

## 2019-01-18 MED ORDER — SODIUM CHLORIDE 0.9 % IV SOLN: INTRAVENOUS | Status: DC

## 2019-01-18 MED ORDER — TORSEMIDE 20 MG PO TABS: 40.0000 mg | ORAL_TABLET | Freq: Every evening | ORAL | Status: DC

## 2019-01-18 MED ORDER — FENTANYL CITRATE (PF) 100 MCG/2ML IJ SOLN
INTRAMUSCULAR | Status: AC
  Filled 2019-01-18: qty 2

## 2019-01-18 MED ORDER — FENTANYL CITRATE (PF) 100 MCG/2ML IJ SOLN
INTRAMUSCULAR | Status: DC | PRN
  Administered 2019-01-18 (×4): 25 ug via INTRAVENOUS

## 2019-01-18 NOTE — Progress Notes (Signed)
*  PRELIMINARY RESULTS* Echocardiogram Echocardiogram Transesophageal has been performed.  Leavy Cella 01/18/2019, 1:43 PM

## 2019-01-18 NOTE — Progress Notes (Addendum)
Advanced Heart Failure Rounding Note  PCP-Cardiologist: Larae Grooms, MD   Subjective:   Yesterday diuresed with 60 mg IV lasix. Negative > 2 liters. Creatinine back down to 1.46. Hypertensive.   Feeling better. Denies SOB.   Objective:   Weight Range: 104.8 kg Body mass index is 42.26 kg/m.   Vital Signs:   Temp:  [97.8 F (36.6 C)-98 F (36.7 C)] 98 F (36.7 C) (12/17 0751) Pulse Rate:  [50-70] 70 (12/17 0751) Resp:  [14-20] 17 (12/17 0751) BP: (127-171)/(44-67) 171/66 (12/17 0751) SpO2:  [92 %-100 %] 98 % (12/17 0751) Weight:  [104.8 kg] 104.8 kg (12/17 0128) Last BM Date: 01/16/19  Weight change: Filed Weights   01/17/19 0351 01/18/19 0128  Weight: 106.9 kg 104.8 kg    Intake/Output:   Intake/Output Summary (Last 24 hours) at 01/18/2019 0811 Last data filed at 01/18/2019 0516 Gross per 24 hour  Intake 303 ml  Output 3000 ml  Net -2697 ml      Physical Exam    General:  Sitting in the chair. No resp difficulty HEENT: Normal Neck: Supple. JVP 11-12 . Carotids 2+ bilat; no bruits. No lymphadenopathy or thyromegaly appreciated. Cor: PMI nondisplaced. Regular rate & rhythm. No rubs, gallops or murmurs. Lungs: Clear Abdomen: obese, soft, nontender, nondistended. No hepatosplenomegaly. No bruits or masses. Good bowel sounds. Extremities: No cyanosis, clubbing, rash, edema Neuro: Alert & orientedx3, cranial nerves grossly intact. moves all 4 extremities w/o difficulty. Affect pleasant   Telemetry  NSR 60s personally reviewed.    EKG    N/A   Labs    CBC Recent Labs    01/17/19 0535 01/18/19 0245  WBC 8.6 11.6*  NEUTROABS  --  8.6*  HGB 9.4* 9.7*  HCT 31.4* 32.3*  MCV 82.2 80.8  PLT 187 093   Basic Metabolic Panel Recent Labs    01/17/19 0535 01/18/19 0245  NA 139 138  K 4.3 4.2  CL 101 101  CO2 26 28  GLUCOSE 184* 152*  BUN 37* 30*  CREATININE 1.97* 1.46*  CALCIUM 9.0 9.0  MG  --  2.2   Liver Function Tests No  results for input(s): AST, ALT, ALKPHOS, BILITOT, PROT, ALBUMIN in the last 72 hours. No results for input(s): LIPASE, AMYLASE in the last 72 hours. Cardiac Enzymes No results for input(s): CKTOTAL, CKMB, CKMBINDEX, TROPONINI in the last 72 hours.  BNP: BNP (last 3 results) Recent Labs    09/10/18 0925 09/18/18 0627 01/11/19 1600  BNP >4,500.0* 226.7* 473.7*    ProBNP (last 3 results) No results for input(s): PROBNP in the last 8760 hours.   D-Dimer No results for input(s): DDIMER in the last 72 hours. Hemoglobin A1C Recent Labs    01/17/19 0535  HGBA1C 7.2*   Fasting Lipid Panel No results for input(s): CHOL, HDL, LDLCALC, TRIG, CHOLHDL, LDLDIRECT in the last 72 hours. Thyroid Function Tests Recent Labs    01/17/19 1351  TSH 0.982    Other results:   Imaging     No results found.   Medications:     Scheduled Medications: . apixaban  5 mg Oral BID  . Chlorhexidine Gluconate Cloth  6 each Topical Q0600  . clopidogrel  75 mg Oral Daily  . influenza vaccine adjuvanted  0.5 mL Intramuscular Tomorrow-1000  . insulin aspart  0-5 Units Subcutaneous QHS  . insulin aspart  0-9 Units Subcutaneous TID WC  . insulin glargine  15 Units Subcutaneous QHS  . mupirocin ointment  1 application Nasal BID  . pneumococcal 23 valent vaccine  0.5 mL Intramuscular Tomorrow-1000  . rosuvastatin  40 mg Oral q1800  . sodium chloride flush  3 mL Intravenous Q12H  . sodium chloride flush  3 mL Intravenous Q12H     Infusions: . sodium chloride       PRN Medications:  sodium chloride, acetaminophen **OR** acetaminophen, ondansetron **OR** ondansetron (ZOFRAN) IV, sodium chloride flush    Assessment/Plan   1. Bradycardia: bradycardic on admit w/ HR in the 30s in the ED w/ associated hypotension w/ SBP in the 70s, requiring atropine. EKG shows sinus brady 50 bpm w/ LBBB (chronic). No high grade arrhthymias detected on tele. Was on ? blocker therapy w/ Toprol XL 50 mg  daily PTA (last dose 12/15 AM). Held on admit. K 5.2 in the setting of AKI but improved today at 4.3. Remains sinus brady in the low 50s - continue ? blocker washout -TSH 0.98 Check free t3/free t4 - She has known CAD w/ h/o CABG and previous RCA PCI and reports recent CP c/w prior anginal pain but Hs troponin is flat, 25>>25. Given no high grade AV block, doubt coronary ischemia to be primary cause of bradycardia. Will continue to assess w/ ? blocker washout. Keep off bb for now.  - no indication for PPM at this time. Can consider 30 day outpatient monitor at d/c  2. Hypotension: resolved.  -Suspect likely 2/2 over diuresis after recent diuretic dose adjustment + addition of Losartan.   3. Acute on Chronic Combined Systolic and Diastolic CHF: Echo in 1/61 with EF 55-60%, moderate diastolic dysfunction.Cardiomems placed 01/16/18. Echo in 3/20 in setting of severe PNA with intubation showed EF 25-30%, mid-apical LV severe hypokinesis. Possible stress (Takotsubo-type) cardiomyopathy related to severe medical illness versus progression of CAD. She has baseline chronic LBBB which could play a role in cardiomyopathy as well (LBBB cardiomyopathy). Repeat echo in 11/20 with EF up to 45-50%, moderately dysfunctional RV. Good response to IV lasix. Restart torsemide 80 mg in am and 40 mg in pm.  Add back 12.5 mg losartan.   4. Mitral Regurgitation: Moderate to severe MR noted on recent TTE 11/20. With multiple exacerbations and readmits for CHF, will need TEE to better assess severity and consideration of MV clip if severe.  TEE today.   5. AKI: Suspect likely 2/2 over diuresis after recent diuretic dose adjustment + recent addition of Losartan. SCr 2.28 and BUN 40 on admit. Creatinine trending down to 1.46 . Stable.   6. Hyperkalemia: 5.2 on admit. Resolved.  - 7. CAD: She has known CAD w/ h/o CABG and multiple PCIs. H/o chronic stable angina. Hs troponin trend flat, 25>>25. Currently CP free.    - continue Plavix + statin - continue to hold ? blocker  8. Hypertensive.  Restart losartan 12.5 mg daily   Length of Stay: 1  Amy Clegg, NP  01/18/2019, 8:11 AM  Advanced Heart Failure Team Pager [phone] (M-F; 7a - 4p)  Please contact [facility] for night-coverage after hours (4p -7a ) and weekends on [email or web address]  Patient seen with NP, agree with the above note.   She diuresed yesterday, weight down.  Creatinine trending down.  Still short of breath with exertion.  No further bradycardia off beta blocker, BP elevated.   TEE was done today, normal LV size with mild LV hypertrophy and EF 50-55%, moderately dilated/mildly dysfunctional RV, moderate MR.   General: NAD Neck: JVP 12+ cm, no thyromegaly or thyroid  nodule.  Lungs: Clear to auscultation bilaterally with normal respiratory effort. CV: Nondisplaced PMI.  Heart regular S1/S2, no S3/S4, 2/6 HSM apex.  Trace ankle edema.  N Abdomen: Soft, nontender, no hepatosplenomegaly, no distention.  Skin: Intact without lesions or rashes.  Neurologic: Alert and oriented x 3.  Psych: Normal affect. Extremities: No clubbing or cyanosis.  HEENT: Normal.   On exam, she is still volume overloaded. Suspect primarily RV failure.  Will hold evening torsemide and give her a dose of Lasix 60 mg IV x 1, can resume po torsemide tomorrow.    Creatinine trending down, BP up. Ok to try her on losartan 12.5 daily.  Follow creatinine carefully in am.   No further bradycardia off beta blocker.  Continue to avoid nodal blockade.  Has baseline LBBB.   Mitral regurgitation is only moderate, continue to follow.   Loralie Champagne 01/18/2019 1:13 PM

## 2019-01-18 NOTE — Interval H&P Note (Signed)
History and Physical Interval Note:  01/18/2019 12:36 PM  [name] [name]  has presented today for surgery, with the diagnosis of MITRAL REGURGITATION.  The various methods of treatment have been discussed with the patient and family. After consideration of risks, benefits and other options for treatment, the patient has consented to  Procedure(s): TRANSESOPHAGEAL ECHOCARDIOGRAM (TEE) (N/A) as a surgical intervention.  The patient's history has been reviewed, patient examined, no change in status, stable for surgery.  I have reviewed the patient's chart and labs.  Questions were answered to the patient's satisfaction.     Uel Davidow [employer]

## 2019-01-18 NOTE — CV Procedure (Signed)
Procedure: TEE  Sedation: Versed 9 mg IV, 25 mg, Fentanyl 100 mcg IV  Indication: Mitral regurgitation  Findings: Please see echo section for full report.  Normal LV size with mild LV hypertrophy.  EF 50-55%, septal-lateral dyssynchrony consistent with LBBB.  Moderately dilated RV with mildly decreased systolic function.  Mild right atrial enlargement, no PFO/ASD by color doppler.  Mild left atrial enlargement.  No LA appendage thrombus.  Mild tricuspid regurgitation, peak RV-RA gradient 51 mmHg. There was moderate mitral regurgitation, PISA ERO 0.18 cm^2.  There was no pulmonary vein doppler flow reversal.  There was restriction of the posterior mitral leaflet.  Trileaflet aortic valve with mild calcification.  No stenosis, trivial regurgitation.  Normal caliber thoracic aorta with mild plaque.   Impression: MR is only moderate.   [name] [name] 01/18/2019 1:09 PM

## 2019-01-18 NOTE — Progress Notes (Signed)
Patient ID: [name] [name], female   DOB: [birth year], 73 y.o.   [name]: [id number]  PROGRESS NOTE    [name] [name]  BTD:[id number] DOB: [birth year] DOA: 01/16/2019 PCP: Derinda Late, MD   Brief Narrative:  73 year old female with history of coronary artery disease, chronic kidney disease stage III, paroxysmal A. fib on Eliquis, depression, anxiety, insulin-dependent diabetes mellitus and chronic systolic and diastolic heart failure presented with fatigue, low heart rate and low blood pressure.  On presentation, his heart rate was dipping into the mid 30s.  Cardiology was consulted.  Creatinine was 2.28, up from 1.46 a few days earlier.  She was started on IV fluids.  High-sensitivity troponin was 25.  Assessment & Plan:   Symptomatic bradycardia along with hypotension -Presented with fatigue, near syncope and heart rate in the 30s with SBP 70s at home -Received a dose of atropine in the ED -Cardiology following.  Heart rate much improved off Toprol.  Continue to hold Toprol. -Continue telemetry monitoring.  Follow further cardiology recommendations  Acute kidney injury on chronic kidney disease stage III -Creatinine 2.28 on admission, up from 1.46 a few days earlier. -Improving.  Creatinine 1.46 today.   -Probably from prerenal azotemia in the setting of increased diuretics and hypotension, also on ARB -Was given IV fluids in the ED. subsequently IV fluids have been discontinued.    Paroxysmal A. fib on Eliquis -Continue Eliquis.  Beta-blocker on hold.  Cardiology following  Acute on chronic systolic and diastolic heart failure -Echo from November 2020 showed EF of 45 to 30%  -Strict input and output.  Daily weights.  Fluid restriction.  Negative balance of 2197 cc since admission. -Cardiology following.  Diuretic management as per cardiology/heart failure team.  Patient was given 1 dose of intravenous Lasix on 01/17/2019.  Beta-blocker, spironolactone and losartan on hold.  Severe  mitral regurgitation -For possible TEE today.  Depression, anxiety -Continue home regimen  Diabetes mellitus type 2 uncontrolled with hyperglycemia -Hemoglobin A1c 7.2 -Continue CBGs with SSI along with Lantus  Anemia of chronic disease -Probably from chronic kidney disease.  Hemoglobin stable.  Generalized deconditioning -PT recommends home health PT.  Stage II pressure injury on left buttock: Present on admission -Follow wound care consult's recommendations.   DVT prophylaxis: Eliquis Code Status: Full Family Communication: Spoke to David/husband on 01/17/2019 on phone Disposition Plan: Home once cleared by cardiology  Consultants: Cardiology  Procedures: None  Antimicrobials: None   Subjective: Patient seen and examined at bedside.  Feels slightly nauseous and dizzy this morning.  Denies overnight fever, vomiting, chest pain or worsening shortness of breath.   Objective: Vitals:   01/18/19 0100 01/18/19 0128 01/18/19 0200 01/18/19 0419  BP:    (!) 157/67  Pulse: 67  63 69  Resp: 20  17 20   Temp:    98 F (36.7 C)  TempSrc:    Oral  SpO2: 97%  98% 97%  Weight:  104.8 kg    Height:        Intake/Output Summary (Last 24 hours) at 01/18/2019 0738 Last data filed at 01/18/2019 0516 Gross per 24 hour  Intake 303 ml  Output 3000 ml  Net -2697 ml   Filed Weights   01/17/19 0351 01/18/19 0128  Weight: 106.9 kg 104.8 kg    Examination:  General exam: No distress.   Respiratory system: Bilateral decreased breath sounds at bases with some scattered crackles Cardiovascular system: S1 & S2 heard, currently rate controlled Gastrointestinal system: Abdomen is nondistended,  soft and nontender. Normal bowel sounds heard. Extremities: No cyanosis; trace lower extremity edema    Data Reviewed: I have personally reviewed following labs and imaging studies  CBC: Recent Labs  Lab 01/16/19 1645 01/17/19 0535 01/18/19 0245  WBC 10.8* 8.6 11.6*  NEUTROABS  --    --  8.6*  HGB 9.9* 9.4* 9.7*  HCT 34.5* 31.4* 32.3*  MCV 85.4 82.2 80.8  PLT 217 187 094   Basic Metabolic Panel: Recent Labs  Lab 01/11/19 1625 01/16/19 1645 01/17/19 0535 01/18/19 0245  NA 140 136 139 138  K 3.5 5.2* 4.3 4.2  CL 95* 97* 101 101  CO2 30 24 26 28   GLUCOSE 125* 141* 184* 152*  BUN 19 40* 37* 30*  CREATININE 1.46* 2.28* 1.97* 1.46*  CALCIUM 9.6 9.0 9.0 9.0  MG  --   --   --  2.2   GFR: Estimated Creatinine Clearance: 40.2 mL/min (A) (by C-G formula based on SCr of 1.46 mg/dL (H)). Liver Function Tests: No results for input(s): AST, ALT, ALKPHOS, BILITOT, PROT, ALBUMIN in the last 168 hours. No results for input(s): LIPASE, AMYLASE in the last 168 hours. No results for input(s): AMMONIA in the last 168 hours. Coagulation Profile: No results for input(s): INR, PROTIME in the last 168 hours. Cardiac Enzymes: No results for input(s): CKTOTAL, CKMB, CKMBINDEX, TROPONINI in the last 168 hours. BNP (last 3 results) No results for input(s): PROBNP in the last 8760 hours. HbA1C: Recent Labs    01/17/19 0535  HGBA1C 7.2*   CBG: Recent Labs  Lab 01/17/19 0617 01/17/19 1035 01/17/19 1620 01/17/19 2112 01/18/19 0604  GLUCAP 189* 149* 208* 267* 169*   Lipid Profile: No results for input(s): CHOL, HDL, LDLCALC, TRIG, CHOLHDL, LDLDIRECT in the last 72 hours. Thyroid Function Tests: Recent Labs    01/17/19 1351  TSH 0.982   Anemia Panel: No results for input(s): VITAMINB12, FOLATE, FERRITIN, TIBC, IRON, RETICCTPCT in the last 72 hours. Sepsis Labs: No results for input(s): PROCALCITON, LATICACIDVEN in the last 168 hours.  Recent Results (from the past 240 hour(s))  Novel Coronavirus, NAA (Hosp order, Send-out to Ref Lab; TAT 18-24 hrs     Status: None   Collection Time: 01/15/19  1:00 PM   Specimen: Nasopharyngeal Swab; Respiratory  Result Value Ref Range Status   SARS-CoV-2, NAA NOT DETECTED NOT DETECTED Final    Comment: (NOTE) This nucleic acid  amplification test was developed and its performance characteristics determined by [employer]. Nucleic acid amplification tests include PCR and TMA. This test has not been FDA cleared or approved. This test has been authorized by FDA under an Emergency Use Authorization (EUA). This test is only authorized for the duration of time the declaration that circumstances exist justifying the authorization of the emergency use of in vitro diagnostic tests for detection of SARS-CoV-2 virus and/or diagnosis of COVID-19 infection under section 564(b)(1) of the Act, 21 U.S.C. 709GGE-3(M) (1), unless the authorization is terminated or revoked sooner. When diagnostic testing is negative, the possibility of a false negative result should be considered in the context of a patient's recent exposures and the presence of clinical signs and symptoms consistent with COVID-19. An individual without symptoms of COVID- 19 and who is not shedding SARS-CoV-2 vi rus would expect to have a negative (not detected) result in this assay. Performed At: [facility] [address] [place], [place] [id number] Rush Farmer MD TK:[id number]    [place]  Final  Comment: Performed at [facility], [place] [address], [place], [place] [address]  SARS CORONAVIRUS 2 (TAT 6-24 HRS) Nasopharyngeal Nasopharyngeal Swab     Status: None   Collection Time: 01/16/19  7:32 PM   Specimen: Nasopharyngeal Swab  Result Value Ref Range Status   SARS Coronavirus 2 NEGATIVE NEGATIVE Final    Comment: (NOTE) SARS-CoV-2 target nucleic acids are NOT DETECTED. The SARS-CoV-2 RNA is generally detectable in upper and lower respiratory specimens during the acute phase of infection. Negative results do not preclude SARS-CoV-2 infection, do not rule out co-infections with other pathogens, and should not be used as the sole basis for treatment or other patient management decisions. Negative results  must be combined with clinical observations, patient history, and epidemiological information. The expected result is Negative. Fact Sheet for Patients: SugarRoll.be Fact Sheet for Healthcare Providers: https://www.woods-mathews.com/ This test is not yet approved or cleared by the [place] FDA and  has been authorized for detection and/or diagnosis of SARS-CoV-2 by FDA under an Emergency Use Authorization (EUA). This EUA will remain  in effect (meaning this test can be used) for the duration of the COVID-19 declaration under Section 56 4(b)(1) of the Act, 21 U.S.C. section 360bbb-3(b)(1), unless the authorization is terminated or revoked sooner. Performed at [facility], [place] [address] [place], [place] [address]   MRSA PCR Screening     Status: Abnormal   Collection Time: 01/17/19  4:07 AM   Specimen: Nasopharyngeal  Result Value Ref Range Status   MRSA by PCR POSITIVE (A) NEGATIVE Final    Comment:        The GeneXpert MRSA Assay (FDA approved for NASAL specimens only), is one component of a comprehensive MRSA colonization surveillance program. It is not intended to diagnose MRSA infection nor to guide or monitor treatment for MRSA infections. RESULT CALLED TO, READ BACK BY AND VERIFIED WITH: HAZERMAN,K RN 01/17/2019 AT 5956 SKEEN,P Performed at [facility], [place] [address], [place], [place] [address]          Radiology Studies: DG Chest Portable 1 View  Result Date: 01/16/2019 CLINICAL DATA:  Weakness. EXAM: PORTABLE CHEST 1 VIEW COMPARISON:  09/18/2018 FINDINGS: Stable enlargement of the cardiopericardial silhouette. Stable changes from prior CABG surgery. No mediastinal or hilar masses. Clear lungs.  No pleural effusion or pneumothorax. Skeletal structures are grossly intact. IMPRESSION: 1. No acute cardiopulmonary disease. 2. Stable cardiomegaly. Electronically Signed   By: Lajean Manes M.D.   On:  01/16/2019 17:19        Scheduled Meds: . apixaban  5 mg Oral BID  . Chlorhexidine Gluconate Cloth  6 each Topical Q0600  . clopidogrel  75 mg Oral Daily  . influenza vaccine adjuvanted  0.5 mL Intramuscular Tomorrow-1000  . insulin aspart  0-5 Units Subcutaneous QHS  . insulin aspart  0-9 Units Subcutaneous TID WC  . insulin glargine  15 Units Subcutaneous QHS  . mupirocin ointment  1 application Nasal BID  . pneumococcal 23 valent vaccine  0.5 mL Intramuscular Tomorrow-1000  . rosuvastatin  40 mg Oral q1800  . sodium chloride flush  3 mL Intravenous Q12H  . sodium chloride flush  3 mL Intravenous Q12H   Continuous Infusions: . sodium chloride            Aline August, MD [facility] 01/18/2019, 7:38 AM

## 2019-01-19 ENCOUNTER — Telehealth: Payer: Self-pay | Admitting: Diagnostic Neuroimaging

## 2019-01-19 ENCOUNTER — Inpatient Hospital Stay (HOSPITAL_COMMUNITY)
Admit: 2019-01-19 | Discharge: 2019-01-19 | Disposition: A | Discharge status: Home / Self Care | Payer: Medicare Other | Attending: Cardiology | Admitting: Cardiology

## 2019-01-19 ENCOUNTER — Other Ambulatory Visit (HOSPITAL_COMMUNITY): Payer: Self-pay | Admitting: *Deleted

## 2019-01-19 DIAGNOSIS — I493 Ventricular premature depolarization: Secondary | ICD-10-CM

## 2019-01-19 LAB — BASIC METABOLIC PANEL
Anion gap: 13 (ref 5–15)
BUN: 19 mg/dL (ref 8–23)
CO2: 30 mmol/L (ref 22–32)
Calcium: 8.9 mg/dL (ref 8.9–10.3)
Chloride: 97 mmol/L — ABNORMAL LOW (ref 98–111)
Creatinine, Ser: 1.34 mg/dL — ABNORMAL HIGH (ref 0.44–1.00)
GFR calc Af Amer: 46 mL/min — ABNORMAL LOW (ref >60)
GFR calc non Af Amer: 40 mL/min — ABNORMAL LOW (ref >60)
Glucose, Bld: 272 mg/dL — ABNORMAL HIGH (ref 70–99)
Potassium: 3.5 mmol/L (ref 3.5–5.1)
Sodium: 140 mmol/L (ref 135–145)

## 2019-01-19 LAB — GLUCOSE, CAPILLARY
Glucose-Capillary: 137 mg/dL — ABNORMAL HIGH (ref 70–99)
Glucose-Capillary: 206 mg/dL — ABNORMAL HIGH (ref 70–99)

## 2019-01-19 LAB — MAGNESIUM: Magnesium: 1.9 mg/dL (ref 1.7–2.4)

## 2019-01-19 MED ORDER — IPRATROPIUM-ALBUTEROL 0.5-2.5 (3) MG/3ML IN SOLN: 3.0000 mL | Freq: Two times a day (BID) | RESPIRATORY_TRACT | Status: DC | PRN

## 2019-01-19 MED ORDER — GABAPENTIN 300 MG PO CAPS: 300.0000 mg | ORAL_CAPSULE | ORAL | Status: DC

## 2019-01-19 MED ORDER — SPIRONOLACTONE 12.5 MG HALF TABLET
12.5000 mg | ORAL_TABLET | Freq: Every day | ORAL | Status: DC
  Administered 2019-01-19: 12.5 mg via ORAL
  Filled 2019-01-19: qty 1

## 2019-01-19 MED ORDER — ALPRAZOLAM 0.5 MG PO TABS: 0.5000 mg | ORAL_TABLET | ORAL | Status: DC

## 2019-01-19 MED ORDER — TORSEMIDE 20 MG PO TABS: 40.0000 mg | ORAL_TABLET | Freq: Every evening | ORAL | Status: DC

## 2019-01-19 MED ORDER — SPIRONOLACTONE 25 MG PO TABS: 12.5000 mg | ORAL_TABLET | Freq: Every day | ORAL | Status: DC

## 2019-01-19 NOTE — Discharge Summary (Signed)
Physician Discharge Summary  [name] [name] XLK:[id number] DOB: 28-Dec-1945 DOA: 01/16/2019  PCP: Derinda Late, MD  Admit date: 01/16/2019 Discharge date: 01/19/2019  Admitted From: Home Disposition: Home  Recommendations for Outpatient Follow-up:  1. Follow up with PCP in 1 week with repeat CBC/BMP 2. Outpatient follow-up with cardiology/heart failure 3. Follow up in ED if symptoms worsen or new appear   Home Health: PT/RN Equipment/Devices: Continue oxygen via nasal cannula at 2 L/min.  Patient was on the same amount of oxygen prior to presentation.  Discharge Condition: Stable CODE STATUS: Full Diet recommendation: Heart healthy/carb modified/fluid restriction  Brief/Interim Summary: 73-year-old female with history of coronary artery disease, chronic kidney disease stage III, paroxysmal A. fib on Eliquis, depression, anxiety, insulin-dependent diabetes mellitus and chronic systolic and diastolic heart failure presented with fatigue, low heart rate and low blood pressure.  On presentation, his heart rate was dipping into the mid 30s.  Cardiology was consulted.  Creatinine was 2.28, up from 1.46 a few days earlier.  She was started on IV fluids.  High-sensitivity troponin was 25.  During the hospitalization, her heart rate improved after holding beta-blockers.  She was given intermittent intravenous Lasix.  He underwent TEE which showed only moderate MR.  Cardiology/heart failure team has cleared the patient for discharge with outpatient follow-up with heart failure team.  Discharge Diagnoses:   Symptomatic bradycardia along with hypotension -Presented with fatigue, near syncope and heart rate in the 30s with SBP 70s at home -Received a dose of atropine in the ED -Cardiology following.  Heart rate much improved off Toprol.  Continue to hold Toprol. -Cardiology has cleared the patient for discharge.  Acute kidney injury on chronic kidney disease stage III -Creatinine 2.28 on  admission, up from 1.46 a few days earlier. -Improving.  Creatinine 1.34 today.   -Probably from prerenal azotemia in the setting of increased diuretics and hypotension, also on ARB -Was given IV fluids in the ED. subsequently IV fluids have been discontinued.    Outpatient follow-up of creatinine.  Paroxysmal A. fib on Eliquis -Continue Eliquis.  Beta-blocker will remain on hold on discharge.    Acute on chronic systolic and diastolic heart failure -Echo from November 2020 showed EF of 45 to 30%  - Negative balance of 4303.3 cc since admission. -Cardiology/heart failure team following.    Patient received intermittent intravenous Lasix.  Heart failure team has cleared the patient for discharge.  Beta-blocker will remain on hold on discharge.  Arterial team recommends spironolactone 12.5 mg daily, torsemide 80 mg every morning/40 mg every evening, losartan 12.5 mg daily along with oral potassium supplementation.  Moderate mitral regurgitation -TEE showed moderate mitral regurgitation.  Outpatient follow-up with cardiology.  Depression, anxiety -Continue home regimen  Diabetes mellitus type 2 uncontrolled with hyperglycemia -Hemoglobin A1c 7.2 -Continue home regimen.  Carb modified diet.  Anemia of chronic disease -Probably from chronic kidney disease.  Hemoglobin stable.  Generalized deconditioning -PT recommends home health PT.  Stage II pressure injury on left buttock: Present on admission -Follow wound care consult's recommendations.  Chronic hypoxic respiratory failure -Continue home oxygen at 2 L/min  Discharge Instructions  Discharge Instructions    Diet - low sodium heart healthy   Complete by: As directed    Diet Carb Modified   Complete by: As directed    Increase activity slowly   Complete by: As directed      Allergies as of 01/19/2019      Reactions   Amoxicillin Shortness Of  Breath, Rash   Brilinta [ticagrelor] Shortness Of Breath   Erythromycin  Shortness Of Breath, Other (See Comments), Hives   Trouble swallowing   Flagyl [metronidazole] Shortness Of Breath, Palpitations   Penicillins Hives, Shortness Of Breath, Rash, Other (See Comments)   Has patient had a PCN reaction causing immediate rash, facial/tongue/throat swelling, SOB or lightheadedness with hypotension: Yes Has patient had a PCN reaction causing severe rash involving mucus membranes or skin necrosis: No Has patient had a PCN reaction that required hospitalization: Yes Has patient had a PCN reaction occurring within the last 10 years: No If all of the above answers are "NO", then may proceed with Cephalosporin use.   Isosorbide Mononitrate [isosorbide Nitrate] Other (See Comments)   Joint aches, muscles hurt, difficult to walk   Jardiance [empagliflozin] Other (See Comments)   Nausea, joint aches, muscles aches   Metformin And Related Other (See Comments)   Stomach pain, cold sweats, joint pain, burred vision, dizziness   Tape Other (See Comments)   Skin pulls off with certain types Plastic tape causes skin to rip if left on for long periods of time   Erythromycin Base Rash      Medication List    STOP taking these medications   metoprolol succinate 50 MG 24 hr tablet Commonly known as: TOPROL-XL     TAKE these medications   acetaminophen 500 MG tablet Commonly known as: TYLENOL Take 1 tablet (500 mg total) by mouth every 6 (six) hours as needed for moderate pain or fever. What changed:   how much to take  reasons to take this   albuterol 108 (90 Base) MCG/ACT inhaler Commonly known as: VENTOLIN HFA Inhale 2 puffs into the lungs every 4 (four) hours as needed for wheezing or shortness of breath.   ALPRAZolam 0.5 MG tablet Commonly known as: XANAX Take 1-2 tablets (0.5-1 mg total) by mouth See admin instructions. Take one tablet (0.5 mg) by mouth every morning and two tablets (1 mg) at night, may also take 1 tablet (0.5 mg) midday as needed for  anxiety   apixaban 5 MG Tabs tablet Commonly known as: ELIQUIS Take 1 tablet (5 mg total) by mouth 2 (two) times daily.   bisacodyl 5 MG EC tablet Commonly known as: Dulcolax Take 1 tablet (5 mg total) by mouth daily as needed for moderate constipation.   clopidogrel 75 MG tablet Commonly known as: PLAVIX Take 75 mg by mouth at bedtime.   DULoxetine 30 MG capsule Commonly known as: CYMBALTA Take 1 capsule (30 mg total) by mouth daily.   ezetimibe 10 MG tablet Commonly known as: ZETIA Take 1 tablet (10 mg total) by mouth at bedtime.   Fluticasone-Salmeterol 250-50 MCG/DOSE Aepb Commonly known as: ADVAIR Inhale 1 puff into the lungs daily.   gabapentin 300 MG capsule Commonly known as: NEURONTIN Take 1-2 capsules (300-600 mg total) by mouth See admin instructions. 300mg  in the mornings and 600mg  at bedtime   HumaLOG KwikPen 100 UNIT/ML KwikPen Generic drug: insulin lispro Inject 14-18 Units into the skin See admin instructions. Inject 14 units subcutaneously prior to breakfast and supper; add 4 units for CBG >200   HYDROcodone-acetaminophen 5-325 MG tablet Commonly known as: NORCO/VICODIN Take 1 tablet by mouth at bedtime as needed for moderate pain.   ipratropium-albuterol 0.5-2.5 (3) MG/3ML Soln Commonly known as: DUONEB Take 3 mLs by nebulization 2 (two) times daily as needed (asthma).   Lantus SoloStar 100 UNIT/ML Solostar Pen Generic drug: Insulin Glargine Inject 38-44  Units into the skin See admin instructions. Inject 38 units subcutaneously daily at bedtime, increase to 44 units for CBG >200   losartan 25 MG tablet Commonly known as: COZAAR Take 0.5 tablets (12.5 mg total) by mouth daily. What changed: when to take this   metolazone 2.5 MG tablet Commonly known as: ZAROXOLYN Take 1 tablet (2.5 mg total) by mouth daily as needed (As directed by heart failure clinic. Call heart failure office for direction when experiencing overnight weight gain).    multivitamin with minerals Tabs tablet Take 1 tablet by mouth every morning. Centrum - Women over 55   nitroGLYCERIN 0.3 MG SL tablet Commonly known as: NITROSTAT Place 1 tablet (0.3 mg total) under the tongue every 5 (five) minutes x 3 doses as needed for chest pain.   Ocuvite Eye Health Formula Caps Take 1 capsule by mouth every morning.   ondansetron 4 MG disintegrating tablet Commonly known as: ZOFRAN-ODT Take 4 mg by mouth 2 (two) times daily as needed for nausea or vomiting.   OXYGEN Inhale 2 L into the lungs at bedtime as needed (shortness of breath).   pantoprazole 40 MG tablet Commonly known as: PROTONIX Take 1 tablet (40 mg total) by mouth daily.   potassium chloride SA 20 MEQ tablet Commonly known as: KLOR-CON Take 2 tablets by mouth every morning and 1 tablet by mouth every evening What changed:   how much to take  how to take this  when to take this  additional instructions   Praluent 150 MG/ML Soaj Generic drug: Alirocumab Inject 1 pen into the skin every 14 (fourteen) days. What changed: how much to take   Prolia 60 MG/ML Sosy injection Generic drug: denosumab Inject 60 mg into the skin every 6 (six) months.   ranolazine 500 MG 12 hr tablet Commonly known as: RANEXA Take 1 tablet (500 mg total) by mouth 2 (two) times daily.   REFRESH OP Place 1 drop into both eyes daily as needed (redness/ dry eyes).   rosuvastatin 40 MG tablet Commonly known as: CRESTOR Take 1 tablet (40 mg total) by mouth at bedtime.   spironolactone 25 MG tablet Commonly known as: ALDACTONE Take 0.5 tablets (12.5 mg total) by mouth daily. What changed: how much to take   torsemide 20 MG tablet Commonly known as: DEMADEX Take 4 tablets (80 mg) by mouth every morning, take 2 tablets (40 mg) every evening   traZODone 50 MG tablet Commonly known as: DESYREL Take 1 tablet (50 mg total) by mouth at bedtime.   triamcinolone cream 0.1 % Commonly known as: KENALOG Apply  1 application topically daily as needed (crusty spots on arms).   vitamin B-12 1000 MCG tablet Commonly known as: CYANOCOBALAMIN Take 1,000 mcg by mouth every morning.   VITAMIN B-6 PO Take 1 tablet by mouth every morning.   zolpidem 10 MG tablet Commonly known as: AMBIEN Take 0.5 tablets (5 mg total) by mouth at bedtime.      [place] [facility] Follow up.   Specialty: Home Health Services Why: [facility], [facility] Contact information: [place] [address] [phone]        Larey Dresser, MD Follow up on 01/29/2019.   Specialty: Cardiology Why: at 3:00 [place] information: [place] [address] [phone]        Derinda Late, MD. Schedule an appointment as soon as possible for a visit in  1 week(s).   Specialty: Family Medicine Why: With repeat CBC/BMP Contact information: [place] [address] [phone]        [name], MD .   Specialties: Cardiology, Radiology, Interventional Cardiology Contact information: [address] [place] [address] [phone]          Allergies  Allergen Reactions  . Amoxicillin Shortness Of Breath and Rash  . Brilinta [Ticagrelor] Shortness Of Breath  . Erythromycin Shortness Of Breath, Other (See Comments) and Hives    Trouble swallowing  . Flagyl [Metronidazole] Shortness Of Breath and Palpitations  . Penicillins Hives, Shortness Of Breath, Rash and Other (See Comments)    Has patient had a PCN reaction causing immediate rash, facial/tongue/throat swelling, SOB or lightheadedness with hypotension: Yes Has patient had a PCN reaction causing severe rash involving mucus membranes or skin necrosis: No Has patient had a PCN reaction that required hospitalization: Yes Has patient had a PCN reaction occurring within the last 10 years: No If all of the above answers are  "NO", then may proceed with Cephalosporin use.   . Isosorbide Mononitrate [Isosorbide Nitrate] Other (See Comments)    Joint aches, muscles hurt, difficult to walk   . Jardiance [Empagliflozin] Other (See Comments)    Nausea, joint aches, muscles aches  . Metformin And Related Other (See Comments)    Stomach pain, cold sweats, joint pain, burred vision, dizziness  . Tape Other (See Comments)    Skin pulls off with certain types Plastic tape causes skin to rip if left on for long periods of time  . Erythromycin Base Rash    Consultations:  Cardiology/heart failure team   Procedures/Studies: EEG  Result Date: 12/24/2018 Alexis Goodell, MD     12/24/2018  1:51 PM ELECTROENCEPHALOGRAM REPORT Patient: [name] [name]       Room #: 2I09B EEG No. ID: [id number] Age: 73 y.o.        Sex: female Referring Physician: Eliseo Squires Report Date:  12/24/2018       Interpreting Physician: Alexis Goodell History: [name] [name] is an 74 y.o. female with starring spells Medications: Xanax, Eliquis, ASA, Wellbutrin, Plavix, Cymbalta. Neurontin, Insulin, Dulera, Ranexa, Crestor, Ambien Conditions of Recording:  This is a 21 channel routine scalp EEG performed with bipolar and monopolar montages arranged in accordance to the international 10/20 system of electrode placement. One channel was dedicated to EKG recording. The patient is in the awake, drowsy and asleep states. Description:  The waking background activity is only achieved briefly.  It consists of a low voltage, symmetrical, fairly well organized, 9 Hz alpha activity, seen from the parieto-occipital and posterior temporal regions.  Low voltage fast activity, poorly organized, is seen anteriorly and is at times superimposed on more posterior regions.  A mixture of theta and alpha rhythms are seen from the central and temporal regions. The patient drowses  And sleeps for the majority of the recording.  With drowse there is slowing to irregular, low voltage theta  and beta activity.  The patient goes in to a light sleep with symmetrical sleep spindles, vertex central sharp transients and irregular slow activity. No epileptiform activity is noted.  No starring spells were captured Hyperventilation and intermittent photic stimulation were not performed. IMPRESSION: Normal electroencephalogram, awake and asleep. There are no focal lateralizing or epileptiform features. Alexis Goodell, MD Neurology [phone] 12/24/2018, 1:47 PM   MR ANGIO HEAD WO CONTRAST  Result Date: 12/23/2018 CLINICAL DATA:  TIA EXAM:  MRI HEAD WITHOUT CONTRAST MRA HEAD WITHOUT CONTRAST TECHNIQUE: Multiplanar, multiecho pulse sequences of the brain and surrounding structures were obtained without intravenous contrast. Angiographic images of the head were obtained using MRA technique without contrast. COMPARISON:  CT head 09/18/2018 FINDINGS: MRI HEAD FINDINGS Brain: Negative for acute infarct Chronic right posterior cerebral artery infarct involving the inferior right occipital lobe. Chronic ischemic change in the white matter. Scattered areas of chronic microhemorrhage in the left cerebral hemisphere. Vascular: Normal arterial flow voids. Skull and upper cervical spine: No focal bone lesion. Anterolisthesis C3-4 with spurring and spinal stenosis. Cord flattening. Sinuses/Orbits: Negative Other: None MRA HEAD FINDINGS Both vertebral arteries patent to the basilar. PICA patent bilaterally with scattered atherosclerotic disease. Mild to moderate stenosis in the proximal and mid basilar. Superior cerebellar and posterior cerebral arteries patent bilaterally. Multiple areas of moderate stenosis in the distal posterior cerebral artery on the right. Extensive atherosclerotic irregularity and moderate stenosis throughout the cavernous carotid bilaterally. Moderate stenosis in the A2 on the right. Mild stenosis left A2. Middle cerebral arteries patent without significant stenosis or occlusion. Negative for  cerebral aneurysm. IMPRESSION: Negative for acute infarct Chronic right PCA infarct.  Chronic ischemia in the white matter Extensive intracranial atherosclerotic disease. Electronically Signed   By: Franchot Gallo M.D.   On: 12/23/2018 17:46   MR BRAIN WO CONTRAST  Result Date: 12/23/2018 CLINICAL DATA:  TIA EXAM: MRI HEAD WITHOUT CONTRAST MRA HEAD WITHOUT CONTRAST TECHNIQUE: Multiplanar, multiecho pulse sequences of the brain and surrounding structures were obtained without intravenous contrast. Angiographic images of the head were obtained using MRA technique without contrast. COMPARISON:  CT head 09/18/2018 FINDINGS: MRI HEAD FINDINGS Brain: Negative for acute infarct Chronic right posterior cerebral artery infarct involving the inferior right occipital lobe. Chronic ischemic change in the white matter. Scattered areas of chronic microhemorrhage in the left cerebral hemisphere. Vascular: Normal arterial flow voids. Skull and upper cervical spine: No focal bone lesion. Anterolisthesis C3-4 with spurring and spinal stenosis. Cord flattening. Sinuses/Orbits: Negative Other: None MRA HEAD FINDINGS Both vertebral arteries patent to the basilar. PICA patent bilaterally with scattered atherosclerotic disease. Mild to moderate stenosis in the proximal and mid basilar. Superior cerebellar and posterior cerebral arteries patent bilaterally. Multiple areas of moderate stenosis in the distal posterior cerebral artery on the right. Extensive atherosclerotic irregularity and moderate stenosis throughout the cavernous carotid bilaterally. Moderate stenosis in the A2 on the right. Mild stenosis left A2. Middle cerebral arteries patent without significant stenosis or occlusion. Negative for cerebral aneurysm. IMPRESSION: Negative for acute infarct Chronic right PCA infarct.  Chronic ischemia in the white matter Extensive intracranial atherosclerotic disease. Electronically Signed   By: Franchot Gallo M.D.   On: 12/23/2018  17:46   DG Chest Portable 1 View  Result Date: 01/16/2019 CLINICAL DATA:  Weakness. EXAM: PORTABLE CHEST 1 VIEW COMPARISON:  09/18/2018 FINDINGS: Stable enlargement of the cardiopericardial silhouette. Stable changes from prior CABG surgery. No mediastinal or hilar masses. Clear lungs.  No pleural effusion or pneumothorax. Skeletal structures are grossly intact. IMPRESSION: 1. No acute cardiopulmonary disease. 2. Stable cardiomegaly. Electronically Signed   By: Lajean Manes M.D.   On: 01/16/2019 17:19   Overnight EEG with video  Result Date: 12/26/2018 Lora Havens, MD     12/26/2018 12:56 PM Patient Name: [name] [name] [name]: [id number] Epilepsy Attending: Lora Havens Referring Physician/Provider: Dr Roland Rack Duration: 12/25/2018 1236 to 12/26/2018 1020 Patient history: ESLI CLEMENTS is an 73 y.o. female with staring  spells. EEG to evaluate for seizures Level of alertness: awake, asleep AEDs during EEG study: None Technical aspects: This EEG study was done with scalp electrodes positioned according to the 10-20 International system of electrode placement. Electrical activity was acquired at a sampling rate of 500Hz  and reviewed with a high frequency filter of 70Hz  and a low frequency filter of 1Hz . EEG data were recorded continuously and digitally stored. DESCRIPTION: During awake state, the posterior dominant rhythm consists of 8-9 Hz activity of moderate voltage (25-35 uV) seen predominantly in posterior head regions, symmetric and reactive to eye opening and eye closing. Sleep was characterized by vertex waves, sleep spindles (12-14Hz ), maximum frontocentral region. Physiologic photic driving was seen during photic stimulation. No EEG change was seen during hyperventilation. IMPRESSION: This study is within normal limits. No seizures or epileptiform discharges were seen throughout the recording. Lora Havens   ECHOCARDIOGRAM COMPLETE  Result Date: 12/24/2018    ECHOCARDIOGRAM REPORT   Patient Name:   [name] [name] Date of Exam: 12/24/2018 Medical Rec #:  [id number]      Height:       62.0 in Accession #:    [id number]     Weight:       228.6 lb Date of Birth:  [birth year]      BSA:          2.02 m Patient Age:    61 years       BP:           124/40 mmHg Patient Gender: F              HR:           54 bpm. Exam Location:  Inpatient Procedure: 2D Echo and Saline Contrast Bubble Study Indications:    TIA 435.9  History:        Patient has prior history of Echocardiogram examinations, most                 recent 09/17/2018. CAD, chronic kidney disease, Arrythmias:Atrial                 Fibrillation; Risk Factors:Sleep Apnea, Hypertension,                 Dyslipidemia and Diabetes.  Sonographer:    Johny Chess Referring Phys: [place]  1. Left ventricular ejection fraction, by visual estimation, is 45 to 50%. The left ventricle has mildly decreased function. There is no left ventricular hypertrophy.  2. Left ventricular diastolic parameters are indeterminate.  3. Septal and posterior lateral hypokinesis.  4. Global right ventricle has moderately reduced systolic function.The right ventricular size is moderately enlarged. No increase in right ventricular wall thickness.  5. Left atrial size was severely dilated.  6. Right atrial size was severely dilated.  7. Severe calcification of the mitral valve leaflet(s).  8. Severe mitral annular calcification.  9. Severe thickening of the mitral valve leaflet(s). 10. The mitral valve is degenerative. Moderate to severe mitral valve regurgitation. No evidence of mitral stenosis. 11. The tricuspid valve is normal in structure. Tricuspid valve regurgitation moderate. 12. The aortic valve is tricuspid. Aortic valve regurgitation is not visualized. No evidence of aortic valve sclerosis or stenosis. 13. There is Moderate thickening of the aortic valve. 14. There is Sclerosis without any evidence of stenosis of the  aortic valve. 15. The pulmonic valve was grossly normal. Pulmonic valve regurgitation is moderate. 16. Moderately elevated pulmonary artery systolic pressure. 17. The inferior  vena cava is normal in size with greater than 50% respiratory variability, suggesting right atrial pressure of 3 mmHg. FINDINGS  Left Ventricle: Left ventricular ejection fraction, by visual estimation, is 45 to 50%. The left ventricle has mildly decreased function. There is no left ventricular hypertrophy. Left ventricular diastolic parameters are indeterminate. Normal left atrial pressure. Septal and posterior lateral hypokinesis. Right Ventricle: The right ventricular size is moderately enlarged. No increase in right ventricular wall thickness. Global RV systolic function is has moderately reduced systolic function. The tricuspid regurgitant velocity is 3.22 m/s, and with an assumed right atrial pressure of 10 mmHg, the estimated right ventricular systolic pressure is moderately elevated at 51.5 mmHg. Left Atrium: Left atrial size was severely dilated. Right Atrium: Right atrial size was severely dilated Pericardium: There is no evidence of pericardial effusion. Mitral Valve: The mitral valve is degenerative in appearance. There is severe thickening of the mitral valve leaflet(s). There is severe calcification of the mitral valve leaflet(s). Severe mitral annular calcification. No evidence of mitral valve stenosis by observation. Moderate to severe mitral valve regurgitation. Tricuspid Valve: The tricuspid valve is normal in structure. Tricuspid valve regurgitation moderate. Aortic Valve: The aortic valve is tricuspid. . There is Moderate thickening and Sclerosis without any evidence of stenosis of the aortic valve. Aortic valve regurgitation is not visualized. The aortic valve is structurally normal, with no evidence of sclerosis or stenosis. There is Moderate thickening of the aortic valve. Sclerosis without any evidence of stenosis.  Pulmonic Valve: The pulmonic valve was grossly normal. Pulmonic valve regurgitation is moderate. Aorta: The aortic root, ascending aorta and aortic arch are all structurally normal, with no evidence of dilitation or obstruction. Venous: The inferior vena cava is normal in size with greater than 50% respiratory variability, suggesting right atrial pressure of 3 mmHg. IAS/Shunts: No atrial level shunt detected by color flow Doppler. Agitated saline contrast was given intravenously to evaluate for intracardiac shunting. No ventricular septal defect is seen or detected. There is no evidence of an atrial septal defect.  LEFT VENTRICLE PLAX 2D LVIDd:         5.40 cm  Diastology LVIDs:         4.40 cm  LV e' lateral:   12.20 cm/s LV PW:         1.00 cm  LV E/e' lateral: 11.6 LV IVS:        1.20 cm  LV e' medial:    6.42 cm/s LVOT diam:     1.70 cm  LV E/e' medial:  22.0 LV SV:         54 ml LV SV Index:   24.51 LVOT Area:     2.27 cm  RIGHT VENTRICLE RV S prime:     5.66 cm/s TAPSE (M-mode): 1.1 cm LEFT ATRIUM              Index LA diam:        5.20 cm  2.57 cm/m LA Vol (A2C):   113.0 ml 55.84 ml/m LA Vol (A4C):   106.0 ml 52.38 ml/m LA Biplane Vol: 113.0 ml 55.84 ml/m  AORTIC VALVE LVOT Vmax:   103.00 cm/s LVOT Vmean:  72.900 cm/s LVOT VTI:    0.244 m  AORTA Ao Root diam: 2.70 cm MITRAL VALVE                         TRICUSPID VALVE MV Area (PHT): 3.34 cm  TR Peak grad:   41.5 mmHg MV PHT:        65.83 msec            TR Vmax:        322.00 cm/s MV Decel Time: 227 msec MR Peak grad: 97.2 mmHg              SHUNTS MR Mean grad: 62.0 mmHg              Systemic VTI:  0.24 m MR Vmax:      493.00 cm/s            Systemic Diam: 1.70 cm MR Vmean:     372.0 cm/s MV E velocity: 141.00 cm/s 103 cm/s MV A velocity: 77.10 cm/s  70.3 cm/s MV E/A ratio:  1.83        1.5  Jenkins Rouge MD Electronically signed by Jenkins Rouge MD Signature Date/Time: 12/24/2018/10:38:31 AM    Final      TEE: MR was only  moderate  Subjective: Patient seen and examined at bedside.  Feels weak but states that she is okay going home today.  Discharge Exam: Vitals:   01/19/19 0716 01/19/19 1119  BP: (!) 163/59 (!) 172/64  Pulse: 75 84  Resp: 16 12  Temp: 97.6 F (36.4 C) 98 F (36.7 C)  SpO2: 100% 98%    General: Pt is alert, awake, not in acute distress Cardiovascular: rate controlled, S1/S2 + Respiratory: bilateral decreased breath sounds at bases with basilar crackles Abdominal: Soft, NT, ND, bowel sounds + Extremities: trace lower extremity edema, no cyanosis    The results of significant diagnostics from this hospitalization (including imaging, microbiology, ancillary and laboratory) are listed below for reference.     Microbiology: Recent Results (from the past 240 hour(s))  Novel Coronavirus, NAA (Hosp order, Send-out to Ref Lab; TAT 18-24 hrs     Status: None   Collection Time: 01/15/19  1:00 PM   Specimen: Nasopharyngeal Swab; Respiratory  Result Value Ref Range Status   SARS-CoV-2, NAA NOT DETECTED NOT DETECTED Final    Comment: (NOTE) This nucleic acid amplification test was developed and its performance characteristics determined by [employer]. Nucleic acid amplification tests include PCR and TMA. This test has not been FDA cleared or approved. This test has been authorized by FDA under an Emergency Use Authorization (EUA). This test is only authorized for the duration of time the declaration that circumstances exist justifying the authorization of the emergency use of in vitro diagnostic tests for detection of SARS-CoV-2 virus and/or diagnosis of COVID-19 infection under section 564(b)(1) of the Act, 21 U.S.C. 938BOF-7(P) (1), unless the authorization is terminated or revoked sooner. When diagnostic testing is negative, the possibility of a false negative result should be considered in the context of a patient's recent exposures and the presence of clinical signs  and symptoms consistent with COVID-19. An individual without symptoms of COVID- 19 and who is not shedding SARS-CoV-2 vi rus would expect to have a negative (not detected) result in this assay. Performed At: [facility] [address] [place], [place] [id number] Rush Farmer MD OE:[id number]    [place]  Final    Comment: Performed at [facility], [place] [address], [place], [place] [address]  SARS CORONAVIRUS 2 (TAT 6-24 HRS) Nasopharyngeal Nasopharyngeal Swab     Status: None   Collection Time: 01/16/19  7:32 PM   Specimen: Nasopharyngeal Swab  Result Value Ref Range Status  SARS Coronavirus 2 NEGATIVE NEGATIVE Final    Comment: (NOTE) SARS-CoV-2 target nucleic acids are NOT DETECTED. The SARS-CoV-2 RNA is generally detectable in upper and lower respiratory specimens during the acute phase of infection. Negative results do not preclude SARS-CoV-2 infection, do not rule out co-infections with other pathogens, and should not be used as the sole basis for treatment or other patient management decisions. Negative results must be combined with clinical observations, patient history, and epidemiological information. The expected result is Negative. Fact Sheet for Patients: SugarRoll.be Fact Sheet for Healthcare Providers: https://www.woods-mathews.com/ This test is not yet approved or cleared by the [place] FDA and  has been authorized for detection and/or diagnosis of SARS-CoV-2 by FDA under an Emergency Use Authorization (EUA). This EUA will remain  in effect (meaning this test can be used) for the duration of the COVID-19 declaration under Section 56 4(b)(1) of the Act, 21 U.S.C. section 360bbb-3(b)(1), unless the authorization is terminated or revoked sooner. Performed at [facility], [place] [address], [place], [place] [address]   MRSA PCR Screening     Status: Abnormal   Collection  Time: 01/17/19  4:07 AM   Specimen: Nasopharyngeal  Result Value Ref Range Status   MRSA by PCR POSITIVE (A) NEGATIVE Final    Comment:        The GeneXpert MRSA Assay (FDA approved for NASAL specimens only), is one component of a comprehensive MRSA colonization surveillance program. It is not intended to diagnose MRSA infection nor to guide or monitor treatment for MRSA infections. RESULT CALLED TO, READ BACK BY AND VERIFIED WITH: HAZERMAN,K RN 01/17/2019 AT 9924 SKEEN,P Performed at [facility], [place] [address] [place], [place] [address]      Labs: BNP (last 3 results) Recent Labs    09/10/18 0925 09/18/18 0627 01/11/19 1600  BNP >4,500.0* 226.7* 196.2*   Basic Metabolic Panel: Recent Labs  Lab 01/16/19 1645 01/17/19 0535 01/18/19 0245 01/19/19 0824  NA 136 139 138 140  K 5.2* 4.3 4.2 3.5  CL 97* 101 101 97*  CO2 24 26 28 30   GLUCOSE 141* 184* 152* 272*  BUN 40* 37* 30* 19  CREATININE 2.28* 1.97* 1.46* 1.34*  CALCIUM 9.0 9.0 9.0 8.9  MG  --   --  2.2 1.9   Liver Function Tests: No results for input(s): AST, ALT, ALKPHOS, BILITOT, PROT, ALBUMIN in the last 168 hours. No results for input(s): LIPASE, AMYLASE in the last 168 hours. No results for input(s): AMMONIA in the last 168 hours. CBC: Recent Labs  Lab 01/16/19 1645 01/17/19 0535 01/18/19 0245  WBC 10.8* 8.6 11.6*  NEUTROABS  --   --  8.6*  HGB 9.9* 9.4* 9.7*  HCT 34.5* 31.4* 32.3*  MCV 85.4 82.2 80.8  PLT 217 187 188   Cardiac Enzymes: No results for input(s): CKTOTAL, CKMB, CKMBINDEX, TROPONINI in the last 168 hours. BNP: Invalid input(s): POCBNP CBG: Recent Labs  Lab 01/18/19 1136 01/18/19 1540 01/18/19 2117 01/19/19 0607 01/19/19 1124  GLUCAP 168* 165* 221* 137* 206*   D-Dimer No results for input(s): DDIMER in the last 72 hours. Hgb A1c Recent Labs    01/17/19 0535  HGBA1C 7.2*   Lipid Profile No results for input(s): CHOL, HDL, LDLCALC, TRIG, CHOLHDL, LDLDIRECT in  the last 72 hours. Thyroid function studies Recent Labs    01/17/19 1351  TSH 0.982   Anemia work up No results for input(s): VITAMINB12, FOLATE, FERRITIN, TIBC, IRON, RETICCTPCT in the  last 72 hours. Urinalysis    Component Value Date/Time   COLORURINE YELLOW 01/16/2019 2221   APPEARANCEUR CLEAR 01/16/2019 2221   LABSPEC 1.008 01/16/2019 2221   PHURINE 5.0 01/16/2019 [place] 01/16/2019 2221   HGBUR NEGATIVE 01/16/2019 [place] 01/16/2019 [place] 01/16/2019 [place] 01/16/2019 2221   UROBILINOGEN 1.0 04/16/2011 0954   NITRITE POSITIVE (A) 01/16/2019 2221   LEUKOCYTESUR LARGE (A) 01/16/2019 2221   Sepsis Labs Invalid input(s): PROCALCITONIN,  WBC,  LACTICIDVEN Microbiology Recent Results (from the past 240 hour(s))  Novel Coronavirus, NAA (Hosp order, Send-out to Ref Lab; TAT 18-24 hrs     Status: None   Collection Time: 01/15/19  1:00 PM   Specimen: Nasopharyngeal Swab; Respiratory  Result Value Ref Range Status   SARS-CoV-2, NAA NOT DETECTED NOT DETECTED Final    Comment: (NOTE) This nucleic acid amplification test was developed and its performance characteristics determined by [employer]. Nucleic acid amplification tests include PCR and TMA. This test has not been FDA cleared or approved. This test has been authorized by FDA under an Emergency Use Authorization (EUA). This test is only authorized for the duration of time the declaration that circumstances exist justifying the authorization of the emergency use of in vitro diagnostic tests for detection of SARS-CoV-2 virus and/or diagnosis of COVID-19 infection under section 564(b)(1) of the Act, 21 U.S.C. 381WEX-9(B) (1), unless the authorization is terminated or revoked sooner. When diagnostic testing is negative, the possibility of a false negative result should be considered in the context of a patient's recent exposures and the  presence of clinical signs and symptoms consistent with COVID-19. An individual without symptoms of COVID- 19 and who is not shedding SARS-CoV-2 vi rus would expect to have a negative (not detected) result in this assay. Performed At: [facility] [address] [place], [place] [id number] Rush Farmer MD YB:[id number]    [place]  Final    Comment: Performed at [facility] [place] [facility], [place] [address], [place], [place] [address]  SARS CORONAVIRUS 2 (TAT 6-24 HRS) Nasopharyngeal Nasopharyngeal Swab     Status: None   Collection Time: 01/16/19  7:32 PM   Specimen: Nasopharyngeal Swab  Result Value Ref Range Status   SARS Coronavirus 2 NEGATIVE NEGATIVE Final    Comment: (NOTE) SARS-CoV-2 target nucleic acids are NOT DETECTED. The SARS-CoV-2 RNA is generally detectable in upper and lower respiratory specimens during the acute phase of infection. Negative results do not preclude SARS-CoV-2 infection, do not rule out co-infections with other pathogens, and should not be used as the sole basis for treatment or other patient management decisions. Negative results must be combined with clinical observations, patient history, and epidemiological information. The expected result is Negative. Fact Sheet for Patients: SugarRoll.be Fact Sheet for Healthcare Providers: https://www.woods-mathews.com/ This test is not yet approved or cleared by the [place] FDA and  has been authorized for detection and/or diagnosis of SARS-CoV-2 by FDA under an Emergency Use Authorization (EUA). This EUA will remain  in effect (meaning this test can be used) for the duration of the COVID-19 declaration under Section 56 4(b)(1) of the Act, 21 U.S.C. section 360bbb-3(b)(1), unless the authorization is terminated or revoked sooner. Performed at [facility], [place] [address], [place], [place] [address]   MRSA PCR Screening     Status:  Abnormal   Collection Time: 01/17/19  4:07 AM   Specimen: Nasopharyngeal  Result Value Ref Range Status   MRSA by PCR POSITIVE (A) NEGATIVE Final    Comment:        The GeneXpert MRSA Assay (FDA approved for NASAL specimens only), is one component of a comprehensive MRSA colonization surveillance program. It is not intended to diagnose MRSA infection nor to guide or monitor treatment for MRSA infections. RESULT CALLED TO, READ BACK BY AND VERIFIED WITH: HAZERMAN,K RN 01/17/2019 AT 5501 SKEEN,P Performed at [facility], [place] [address] [place], [place] [address]      Time coordinating discharge: 35 minutes  SIGNED:   Aline August, MD  [facility] 01/19/2019, 11:40 AM

## 2019-01-19 NOTE — Progress Notes (Signed)
Patient ID: [name] [name], female   DOB: January 26, 1946, 73 y.o.   [name]: [id number]     Advanced Heart Failure Rounding Note  PCP-Cardiologist: Larae Grooms, MD   Subjective:    IV Lasix again yesterday, I/Os negative net 2.1 L and weight down.  Breathing better.  Telemetry with NSR 90s, no bradyarrhythmias.  She remains hypertensive.   TEE: EF 50-55%, septal-lateral dyssynchrony, moderate RV dilation with mildly decreased systolic function, moderate MR.   Objective:   Weight Range: 99.9 kg Body mass index is 40.28 kg/m.   Vital Signs:   Temp:  [97.6 F (36.4 C)-98.3 F (36.8 C)] 97.6 F (36.4 C) (12/18 0716) Pulse Rate:  [64-77] 75 (12/18 0716) Resp:  [15-23] 16 (12/18 0716) BP: (128-184)/(37-83) 163/59 (12/18 0716) SpO2:  [91 %-100 %] 100 % (12/18 0716) Weight:  [99.9 kg] 99.9 kg (12/18 0600) Last BM Date: 01/18/19  Weight change: Filed Weights   01/17/19 0351 01/18/19 0128 01/19/19 0600  Weight: 106.9 kg 104.8 kg 99.9 kg    Intake/Output:   Intake/Output Summary (Last 24 hours) at 01/19/2019 1116 Last data filed at 01/19/2019 1005 Gross per 24 hour  Intake 113.67 ml  Output 2750 ml  Net -2636.33 ml      Physical Exam    General: NAD Neck: JVP 9-10 cm, no thyromegaly or thyroid nodule.  Lungs: Clear to auscultation bilaterally with normal respiratory effort. CV: Nondisplaced PMI.  Heart regular S1/S2, no S3/S4, no murmur.  No peripheral edema.  Abdomen: Soft, nontender, no hepatosplenomegaly, no distention.  Skin: Intact without lesions or rashes.  Neurologic: Alert and oriented x 3.  Psych: Normal affect. Extremities: No clubbing or cyanosis.  HEENT: Normal.    Telemetry   NSR 90s personally reviewed.    EKG    N/A   Labs    CBC Recent Labs    01/17/19 0535 01/18/19 0245  WBC 8.6 11.6*  NEUTROABS  --  8.6*  HGB 9.4* 9.7*  HCT 31.4* 32.3*  MCV 82.2 80.8  PLT 187 921   Basic Metabolic Panel Recent Labs    01/18/19 0245  01/19/19 0824  NA 138 140  K 4.2 3.5  CL 101 97*  CO2 28 30  GLUCOSE 152* 272*  BUN 30* 19  CREATININE 1.46* 1.34*  CALCIUM 9.0 8.9  MG 2.2 1.9   Liver Function Tests No results for input(s): AST, ALT, ALKPHOS, BILITOT, PROT, ALBUMIN in the last 72 hours. No results for input(s): LIPASE, AMYLASE in the last 72 hours. Cardiac Enzymes No results for input(s): CKTOTAL, CKMB, CKMBINDEX, TROPONINI in the last 72 hours.  BNP: BNP (last 3 results) Recent Labs    09/10/18 0925 09/18/18 0627 01/11/19 1600  BNP >4,500.0* 226.7* 473.7*    ProBNP (last 3 results) No results for input(s): PROBNP in the last 8760 hours.   D-Dimer No results for input(s): DDIMER in the last 72 hours. Hemoglobin A1C Recent Labs    01/17/19 0535  HGBA1C 7.2*   Fasting Lipid Panel No results for input(s): CHOL, HDL, LDLCALC, TRIG, CHOLHDL, LDLDIRECT in the last 72 hours. Thyroid Function Tests Recent Labs    01/17/19 1351  TSH 0.982    Other results:   Imaging    No results found.   Medications:     Scheduled Medications: . apixaban  5 mg Oral BID  . Chlorhexidine Gluconate Cloth  6 each Topical Q0600  . clopidogrel  75 mg Oral Daily  . influenza vaccine adjuvanted  0.5  mL Intramuscular Tomorrow-1000  . insulin aspart  0-5 Units Subcutaneous QHS  . insulin aspart  0-9 Units Subcutaneous TID WC  . insulin glargine  15 Units Subcutaneous QHS  . losartan  12.5 mg Oral Daily  . mupirocin ointment  1 application Nasal BID  . pneumococcal 23 valent vaccine  0.5 mL Intramuscular Tomorrow-1000  . rosuvastatin  40 mg Oral q1800  . sodium chloride flush  3 mL Intravenous Q12H  . sodium chloride flush  3 mL Intravenous Q12H  . spironolactone  12.5 mg Oral Daily  . torsemide  40 mg Oral QPM  . torsemide  80 mg Oral Daily    Infusions: . sodium chloride      PRN Medications: sodium chloride, acetaminophen **OR** acetaminophen, ondansetron **OR** ondansetron (ZOFRAN) IV, sodium  chloride flush    Assessment/Plan   1. Bradycardia: Sinus bradycardia on admit w/ HR in the 30s in the ED w/ associated hypotension w/ SBP in the 70s, requiring atropine. EKG showed sinus brady 50 bpm w/ LBBB (chronic) after atropine. Beta blocker stopped, and now her HR is in the 90s sinus. TSH normal.  - She will stay off beta blocker.  - She has known CAD w/ h/o CABG and previous RCA PCI but no CP and troponin is flat, 25>>25. Given no high grade AV block, doubt coronary ischemia to be primary cause of bradycardia.  - She will get a 2 week Zio patch at discharge.  2. Hypotension: Suspect related to bradycardia.  3. Acute on Chronic Combined Systolic and Diastolic CHF: Echo in 0/16 with EF 55-60%, moderate diastolic dysfunction.Cardiomems placed 01/16/18. Echo in 3/20 in setting of severe PNA with intubation showed EF 25-30%, mid-apical LV severe hypokinesis. Possible stress (Takotsubo-type) cardiomyopathy related to severe medical illness versus progression of CAD. She has baseline chronic LBBB which could play a role in cardiomyopathy as well (LBBB cardiomyopathy). Repeat echo in 11/20 with EF up to 45-50%, moderately dysfunctional RV. TEE this admission with EF 50-55%, septal-lateral dyssynchrony, moderate RV dilation/mild dysfunction, moderate MR.  She was volume overloaded and has been diuresed with weight loss, still mild volume overload.  - Restart torsemide 80 qam/40 qpm, we had recently increased this at outpatient appt. - Continue losartan 12.5 daily and add back spironolactone 12.5 daily.  4. Mitral Regurgitation: Moderate to severe MR noted on recent TTE 11/20. With multiple exacerbations and readmits for CHF, will need TEE to better assess severity and consideration of MV clip if severe => TEE yesterday showed only moderate MR.  5. AKI: In setting of bradycardia/hypotension.  Creatinine now back to baseline, 1.34 today. She has tolerated restarting losartan. 6. CAD: She has known  CAD w/ h/o CABG and multiple PCIs. H/o chronic stable angina. Hs troponin trend flat, 25>>25. No chest pain.  Initial rhythm was sinus brady and not high grade heart block.  Doubt ACS.  - continue Plavix + statin - continue to hold ? blocker 7. HTN: Continue losartan, add back spironolactone 12.5 mg daily.  8. Atrial fibrillation: She is in NSR here.   - Continue apixaban.  9. Cerebrovascular disease: Severe intracranial atherosclerotic disease.  Neuro wants her on Plavix even with needing Eliquis as well.   Disposition: I think she can go home today.  I will arrange CHF clinic followup.  She will need BMET 1 week.  She will need Zio patch for 2 wks for cardiac monitoring.  Cardiac meds for home: Plavix 75 daily, Eliquis 5 mg bid, losartan 12.5 daily,  spironolactone 12.5 daily, torsemide 80 qam/40 qpm, Crestor 40 daily, Praluent, Zetia 10 daily, ranolazine 500 mg bid, KCl 40 qam/20 qpm.   Loralie Champagne 01/19/2019 11:16 AM

## 2019-01-19 NOTE — Telephone Encounter (Signed)
Patient daughter called about recent hospital admission (discharged today; bradycardia, fatigue, renal dz). Pt continues with confusion spells as previously, with stuttering as well. Dr. Jannifer Franklin thought these may be stress / non-organic spells vs related to her underlying insomnia, kidney dz, heart dz issues. Patient is stable at this time. Will refer to dr. Jannifer Franklin to follow up with patient in next 1-2 weeks for a call / visit.   Penni Bombard, MD 91/44/4584, 8:35 PM Certified in Neurology, Neurophysiology and Neuroimaging  [facility] [address], [place], [place] [address] [phone]

## 2019-01-19 NOTE — Plan of Care (Signed)

## 2019-01-22 ENCOUNTER — Ambulatory Visit (HOSPITAL_COMMUNITY)
Admission: RE | Admit: 2019-01-22 | Discharge: 2019-01-22 | Disposition: A | Discharge status: Home / Self Care | Payer: Medicare Other | Source: Ambulatory Visit | Attending: Internal Medicine | Admitting: Internal Medicine

## 2019-01-22 ENCOUNTER — Other Ambulatory Visit: Payer: Self-pay

## 2019-01-22 DIAGNOSIS — I5042 Chronic combined systolic (congestive) and diastolic (congestive) heart failure: Secondary | ICD-10-CM | POA: Diagnosis not present

## 2019-01-22 LAB — BASIC METABOLIC PANEL
Anion gap: 11 (ref 5–15)
BUN: 15 mg/dL (ref 8–23)
CO2: 31 mmol/L (ref 22–32)
Calcium: 9.2 mg/dL (ref 8.9–10.3)
Chloride: 95 mmol/L — ABNORMAL LOW (ref 98–111)
Creatinine, Ser: 1.84 mg/dL — ABNORMAL HIGH (ref 0.44–1.00)
GFR calc Af Amer: 31 mL/min — ABNORMAL LOW (ref >60)
GFR calc non Af Amer: 27 mL/min — ABNORMAL LOW (ref >60)
Glucose, Bld: 190 mg/dL — ABNORMAL HIGH (ref 70–99)
Potassium: 3.5 mmol/L (ref 3.5–5.1)
Sodium: 137 mmol/L (ref 135–145)

## 2019-01-23 ENCOUNTER — Telehealth (HOSPITAL_COMMUNITY): Payer: Self-pay

## 2019-01-23 NOTE — Telephone Encounter (Signed)
Pt aware of results. Verbalized understanding. Pt has an appt on 12/28.  Pt reports she will make appt for repeat labs when she comes to clinic.

## 2019-01-23 NOTE — Telephone Encounter (Signed)
-----   Message from Larey Dresser, MD sent at 01/22/2019  9:43 PM EST ----- No changes for now. BMET again in 2 wks.

## 2019-01-28 DIAGNOSIS — I130 Hypertensive heart and chronic kidney disease with heart failure and stage 1 through stage 4 chronic kidney disease, or unspecified chronic kidney disease: Secondary | ICD-10-CM | POA: Diagnosis not present

## 2019-01-29 ENCOUNTER — Other Ambulatory Visit: Payer: Self-pay

## 2019-01-29 ENCOUNTER — Encounter (HOSPITAL_COMMUNITY): Payer: Self-pay | Admitting: Cardiology

## 2019-01-29 ENCOUNTER — Ambulatory Visit (HOSPITAL_COMMUNITY)
Admission: RE | Admit: 2019-01-29 | Discharge: 2019-01-29 | Disposition: A | Discharge status: Home / Self Care | Payer: Medicare Other | Source: Ambulatory Visit | Attending: Cardiology | Admitting: Cardiology

## 2019-01-29 VITALS — BP 112/65 | HR 63 | Wt 228.4 lb

## 2019-01-29 DIAGNOSIS — I480 Paroxysmal atrial fibrillation: Secondary | ICD-10-CM | POA: Diagnosis not present

## 2019-01-29 DIAGNOSIS — Z955 Presence of coronary angioplasty implant and graft: Secondary | ICD-10-CM | POA: Diagnosis not present

## 2019-01-29 DIAGNOSIS — I5042 Chronic combined systolic (congestive) and diastolic (congestive) heart failure: Secondary | ICD-10-CM | POA: Diagnosis not present

## 2019-01-29 DIAGNOSIS — Z85820 Personal history of malignant melanoma of skin: Secondary | ICD-10-CM | POA: Diagnosis not present

## 2019-01-29 DIAGNOSIS — D693 Immune thrombocytopenic purpura: Secondary | ICD-10-CM | POA: Insufficient documentation

## 2019-01-29 DIAGNOSIS — G4733 Obstructive sleep apnea (adult) (pediatric): Secondary | ICD-10-CM | POA: Diagnosis not present

## 2019-01-29 DIAGNOSIS — M1990 Unspecified osteoarthritis, unspecified site: Secondary | ICD-10-CM | POA: Diagnosis not present

## 2019-01-29 DIAGNOSIS — I5022 Chronic systolic (congestive) heart failure: Secondary | ICD-10-CM | POA: Diagnosis not present

## 2019-01-29 DIAGNOSIS — K219 Gastro-esophageal reflux disease without esophagitis: Secondary | ICD-10-CM | POA: Diagnosis not present

## 2019-01-29 DIAGNOSIS — I130 Hypertensive heart and chronic kidney disease with heart failure and stage 1 through stage 4 chronic kidney disease, or unspecified chronic kidney disease: Secondary | ICD-10-CM | POA: Diagnosis not present

## 2019-01-29 DIAGNOSIS — E1143 Type 2 diabetes mellitus with diabetic autonomic (poly)neuropathy: Secondary | ICD-10-CM | POA: Insufficient documentation

## 2019-01-29 DIAGNOSIS — Z951 Presence of aortocoronary bypass graft: Secondary | ICD-10-CM | POA: Insufficient documentation

## 2019-01-29 DIAGNOSIS — R079 Chest pain, unspecified: Secondary | ICD-10-CM | POA: Diagnosis not present

## 2019-01-29 DIAGNOSIS — E785 Hyperlipidemia, unspecified: Secondary | ICD-10-CM | POA: Insufficient documentation

## 2019-01-29 DIAGNOSIS — Z79899 Other long term (current) drug therapy: Secondary | ICD-10-CM | POA: Diagnosis not present

## 2019-01-29 DIAGNOSIS — Z794 Long term (current) use of insulin: Secondary | ICD-10-CM | POA: Insufficient documentation

## 2019-01-29 DIAGNOSIS — E1122 Type 2 diabetes mellitus with diabetic chronic kidney disease: Secondary | ICD-10-CM | POA: Diagnosis not present

## 2019-01-29 DIAGNOSIS — I2510 Atherosclerotic heart disease of native coronary artery without angina pectoris: Secondary | ICD-10-CM | POA: Diagnosis not present

## 2019-01-29 DIAGNOSIS — N189 Chronic kidney disease, unspecified: Secondary | ICD-10-CM | POA: Diagnosis not present

## 2019-01-29 DIAGNOSIS — I447 Left bundle-branch block, unspecified: Secondary | ICD-10-CM | POA: Diagnosis not present

## 2019-01-29 DIAGNOSIS — Z8673 Personal history of transient ischemic attack (TIA), and cerebral infarction without residual deficits: Secondary | ICD-10-CM | POA: Diagnosis not present

## 2019-01-29 DIAGNOSIS — Z7951 Long term (current) use of inhaled steroids: Secondary | ICD-10-CM | POA: Insufficient documentation

## 2019-01-29 DIAGNOSIS — I080 Rheumatic disorders of both mitral and aortic valves: Secondary | ICD-10-CM | POA: Insufficient documentation

## 2019-01-29 DIAGNOSIS — F419 Anxiety disorder, unspecified: Secondary | ICD-10-CM | POA: Diagnosis not present

## 2019-01-29 DIAGNOSIS — Z7902 Long term (current) use of antithrombotics/antiplatelets: Secondary | ICD-10-CM | POA: Insufficient documentation

## 2019-01-29 DIAGNOSIS — I25119 Atherosclerotic heart disease of native coronary artery with unspecified angina pectoris: Secondary | ICD-10-CM

## 2019-01-29 DIAGNOSIS — Z7901 Long term (current) use of anticoagulants: Secondary | ICD-10-CM | POA: Diagnosis not present

## 2019-01-29 DIAGNOSIS — I208 Other forms of angina pectoris: Secondary | ICD-10-CM

## 2019-01-29 DIAGNOSIS — K3184 Gastroparesis: Secondary | ICD-10-CM | POA: Insufficient documentation

## 2019-01-29 DIAGNOSIS — Z8249 Family history of ischemic heart disease and other diseases of the circulatory system: Secondary | ICD-10-CM | POA: Insufficient documentation

## 2019-01-29 LAB — BASIC METABOLIC PANEL
Anion gap: 11 (ref 5–15)
BUN: 17 mg/dL (ref 8–23)
CO2: 32 mmol/L (ref 22–32)
Calcium: 9.3 mg/dL (ref 8.9–10.3)
Chloride: 94 mmol/L — ABNORMAL LOW (ref 98–111)
Creatinine, Ser: 1.73 mg/dL — ABNORMAL HIGH (ref 0.44–1.00)
GFR calc Af Amer: 34 mL/min — ABNORMAL LOW (ref >60)
GFR calc non Af Amer: 29 mL/min — ABNORMAL LOW (ref >60)
Glucose, Bld: 123 mg/dL — ABNORMAL HIGH (ref 70–99)
Potassium: 4.2 mmol/L (ref 3.5–5.1)
Sodium: 137 mmol/L (ref 135–145)

## 2019-01-29 MED ORDER — RANOLAZINE ER 1000 MG PO TB12: 1000.0000 mg | ORAL_TABLET | Freq: Two times a day (BID) | ORAL | 1 refills | Status: DC

## 2019-01-29 NOTE — Patient Instructions (Signed)
INCREASE Ranexa to 1000mg  (1 tab) twice a day   Labs today We will only contact you if something comes back abnormal or we need to make some changes. Otherwise no news is good news!  You have been ordered for a stress test to assess your chest pain.  You will get a call to schedule this appointment. Your physician has requested that you have a lexiscan myoview. For further information please visit [email or web address]. Please follow instruction sheet, as given.  You have been referred to outpatient physical therapy in [place].  They will call you to schedule this appointment. Please call our office if you do not hear from them by next week.   Your physician recommends that you schedule a follow-up appointment in: 3 weeks with the Nurse Practitioner/ Physician Assistant  At the [facility], you and your health needs are our priority. As part of our continuing mission to provide you with exceptional heart care, we have created designated Provider Care Teams. These Care Teams include your primary Cardiologist (physician) and Advanced Practice Providers (APPs- Physician Assistants and Nurse Practitioners) who all work together to provide you with the care you need, when you need it.   You may see any of the following providers on your designated Care Team at your next follow up: Marland Kitchen Dr Glori Bickers . Dr Loralie Champagne . Darrick Grinder, NP . Lyda Jester, PA . Audry Riles, PharmD   Please be sure to bring in all your medications bottles to every appointment.

## 2019-01-30 ENCOUNTER — Emergency Department (HOSPITAL_COMMUNITY)
Admission: EM | Admit: 2019-01-30 | Discharge: 2019-01-31 | Disposition: A | Discharge status: Home / Self Care | Payer: Medicare Other | Attending: Emergency Medicine | Admitting: Emergency Medicine

## 2019-01-30 ENCOUNTER — Emergency Department (HOSPITAL_COMMUNITY): Payer: Medicare Other

## 2019-01-30 ENCOUNTER — Other Ambulatory Visit: Payer: Self-pay

## 2019-01-30 ENCOUNTER — Encounter (HOSPITAL_COMMUNITY): Payer: Self-pay | Admitting: Emergency Medicine

## 2019-01-30 ENCOUNTER — Telehealth (HOSPITAL_COMMUNITY): Payer: Self-pay | Admitting: *Deleted

## 2019-01-30 DIAGNOSIS — Z951 Presence of aortocoronary bypass graft: Secondary | ICD-10-CM | POA: Insufficient documentation

## 2019-01-30 DIAGNOSIS — R0789 Other chest pain: Secondary | ICD-10-CM | POA: Insufficient documentation

## 2019-01-30 DIAGNOSIS — I130 Hypertensive heart and chronic kidney disease with heart failure and stage 1 through stage 4 chronic kidney disease, or unspecified chronic kidney disease: Secondary | ICD-10-CM | POA: Diagnosis not present

## 2019-01-30 DIAGNOSIS — Z7901 Long term (current) use of anticoagulants: Secondary | ICD-10-CM | POA: Insufficient documentation

## 2019-01-30 DIAGNOSIS — Z79899 Other long term (current) drug therapy: Secondary | ICD-10-CM | POA: Diagnosis not present

## 2019-01-30 DIAGNOSIS — I5042 Chronic combined systolic (congestive) and diastolic (congestive) heart failure: Secondary | ICD-10-CM | POA: Insufficient documentation

## 2019-01-30 DIAGNOSIS — E1122 Type 2 diabetes mellitus with diabetic chronic kidney disease: Secondary | ICD-10-CM | POA: Diagnosis not present

## 2019-01-30 DIAGNOSIS — Z794 Long term (current) use of insulin: Secondary | ICD-10-CM | POA: Insufficient documentation

## 2019-01-30 DIAGNOSIS — I2510 Atherosclerotic heart disease of native coronary artery without angina pectoris: Secondary | ICD-10-CM | POA: Insufficient documentation

## 2019-01-30 DIAGNOSIS — N183 Chronic kidney disease, stage 3 unspecified: Secondary | ICD-10-CM | POA: Diagnosis not present

## 2019-01-30 LAB — TROPONIN I (HIGH SENSITIVITY)
Troponin I (High Sensitivity): 13 ng/L (ref <18)
Troponin I (High Sensitivity): 16 ng/L (ref <18)

## 2019-01-30 LAB — BASIC METABOLIC PANEL
Anion gap: 11 (ref 5–15)
BUN: 18 mg/dL (ref 8–23)
CO2: 32 mmol/L (ref 22–32)
Calcium: 9.2 mg/dL (ref 8.9–10.3)
Chloride: 94 mmol/L — ABNORMAL LOW (ref 98–111)
Creatinine, Ser: 1.64 mg/dL — ABNORMAL HIGH (ref 0.44–1.00)
GFR calc Af Amer: 36 mL/min — ABNORMAL LOW (ref >60)
GFR calc non Af Amer: 31 mL/min — ABNORMAL LOW (ref >60)
Glucose, Bld: 142 mg/dL — ABNORMAL HIGH (ref 70–99)
Potassium: 4 mmol/L (ref 3.5–5.1)
Sodium: 137 mmol/L (ref 135–145)

## 2019-01-30 LAB — CBC
HCT: 34.3 % — ABNORMAL LOW (ref 36.0–46.0)
Hemoglobin: 10.1 g/dL — ABNORMAL LOW (ref 12.0–15.0)
MCH: 24.3 pg — ABNORMAL LOW (ref 26.0–34.0)
MCHC: 29.4 g/dL — ABNORMAL LOW (ref 30.0–36.0)
MCV: 82.5 fL (ref 80.0–100.0)
Platelets: 203 10*3/uL (ref 150–400)
RBC: 4.16 MIL/uL (ref 3.87–5.11)
RDW: 19.1 % — ABNORMAL HIGH (ref 11.5–15.5)
WBC: 8 10*3/uL (ref 4.0–10.5)
nRBC: 0 % (ref 0.0–0.2)

## 2019-01-30 LAB — PROTIME-INR
INR: 1.5 — ABNORMAL HIGH (ref 0.8–1.2)
Prothrombin Time: 17.8 seconds — ABNORMAL HIGH (ref 11.4–15.2)

## 2019-01-30 MED ORDER — SODIUM CHLORIDE 0.9% FLUSH: 3.0000 mL | Freq: Once | INTRAVENOUS | Status: DC

## 2019-01-30 NOTE — ED Triage Notes (Signed)
C/o tightness to center of chest for 2-3 weeks.  Reports blood pressure fluctuating over the past few weeks.  Reports SOB with exertion.

## 2019-01-30 NOTE — Telephone Encounter (Signed)
[name] [name] with [facility] called to report pts daughter called her to inform her that pt had a near syncopal episode today. She was sitting down and then stood up to go to the rest room and almost made it to the bathroom but her legs got weak . They were able to prevent patient from falling by sitting her in her wheel chair. Her daughter checked her bp after the episode and it was 115/49 (heart rate 69).  They checked her bp again 57min later and it was 119/69.  Routed to [place]

## 2019-01-30 NOTE — Progress Notes (Signed)
Date:  01/30/2019   ID:  JAMESIA LINNEN, DOB 12-16-1945, [name] [id number]   Provider location: [facility] Type of Visit: Established patient   PCP:  Derinda Late, MD  Cardiologist:  Dr. [name]  Chief Complaint: Exertional shortness of breath   History of Present Illness: [name] [name] is a 73 y.o. female who has a history of CAD s/p CABG, diastolic CHF, and cerebrovascular disease with history of CVA presents for followup of CHF and diastolic CHF. She had CABG x 5 in 12/11.  Prior to the CABG she had multiple PCIs.  She had a CVA in 2/14 that presented as visual loss.  She had a left carotid stent in 2/15.   Left heart cath done in Sept. 2014. This showed patent SVG-D, LIMA-LAD, and sequential SVG-OM branches but SVG-RCA and the native RCA were both totally occluded. It was felt aht her increased symptoms coincided with occlusion of SVG-RCA.  She was started on Imdur to see if this would help with the chest pain and dyspnea.  However, she feels like Imdur causes leg cramps and does not think that she can take it.  So ranolazine 500 mg bid started and titrated this up to 1000 mg bid.  This helped some but not markedly.  Therefore, sent to  Dr. Irish Lack to address opening RCA CTO. He was able to do this in 5/15 with 4 overlapping Xience DES.  This led to resolution of exertional chest pain.    Mrs Babineau was started on Brilinta after PCI.  She became much more short of breath after starting on Brilinta. Per Dr Delano Metz stopped and replaced with Plavix.  Dyspnea improved off Brilinta. Given increasing exertional dyspnea and chest pain,  RHC/LHC was done in 4/17.  This showed stable CAD with no interventional target.  Left and right heart filling pressures were not significantly increased.  Medical management.   She had an MRI/MRA in May 2018 that showed moderately severe stenosis of the supraclinoid segment of the right internal carotid artery, progressed  since the 2016 MR angiogram and severe basilar artery stenosis.   She developed recurrent exertional chest pain and had LHC in 6/18, showing 4/5 grafts patent with patent native RCA (similar to prior cath, no changes).    Echo 1/19 with EF 55-60%, moderate diastolic dysfunction, PASP 50 mmHg, mildly dilated RV, mild AS, mild-moderate MR.   She reported increased dyspnea and chest pain and had RHC/LHC in 9/19.  She had DES to sequential SVG-OM1/PLOM.  While hospitalized, she was noted to have runs of atrial fibrillation.  She was very symptomatic with the atrial fibrillation.  Eliquis and amiodarone were started.   She was admitted 12/16-12/19/19 for cardiomems implant and RHC. She was diuresed with IV lasix and transitioned to torsemide 100 mg BID + metolazone once/week. DC weight: 295 lbs.  She had a prolonged hospitalization in 2/20 with acute respiratory failure and fever to 103.  She was thought to have PNA and treated with cefepime.  She was intubated.  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  Amiodarone was stopped and she was put on prednisone. Echo in 2/20 showed EF down to 25-30% with mid-apical LV severe hypokinesis. She had AKI. Possible ITP triggered by infection, HIT negative.  ITP was treated with Solumedrol. She was discharged to SNF.    ORIF left ankle 4/20.   11/20 admission initially with concern for TIAs (staring spells),  ended up thinking possible partial seizures.  Head MRI did not show CVA.  Echo in 11/20 showed EF 45-50%, moderately decreased RV systolic function, moderate RV enlargement, moderate-severe MR, moderate TR.   Patient was admitted again in 12/20 with AKI and hypotension in the setting of sinus bradycardia (HR 30s).  She was volume overloaded on exam.  Toprol XL was stopped and HR increased to 60s.  She was diuresed with IV Lasix.  TEE in 12/20 showed EF 50-55%, septal-lateral dyssynchrony, mildly decreased RV function, moderate  MR.   She returns today for followup of CHF.  She is back at home.  Does not have adequate Wifi for Cardiomems (working on this).  No lightheadedness or syncope since stopping Toprol XL, but having more chest pain now.  She notes central chest tightness daily, gets it when she bends over or walks a longer distance, never at rest.  She wore a 2 wk Zio monitor after discharge from hospital, awaiting results.  Breathing is stable, short of breath walking across her house.  Generalized fatigue.  Weight stable at home.    ECG (personally reviewed): NSR, LBBB 188 msec  Labs (6/14): K 3.8, creatinine 0.71 Labs (8/14): BNP 249, LDL 166 Labs (9/14): K 4.7, creatinine 0.7 Labs (9/14): K 4.6, creatinine 0.9, BNP 109 Labs (1/15): K 4.5, creatinine 0.73, LDL 212, HCT 43.4, TSH normal, BNP 138 Labs (6/15): K 4.7, creatinine 1.17, LDL 100, HDL 37, TGs 363, BNP 39 Labs (10/15): K 4.6, creatinine 1.2 Labs (1/16): LDL 157, HDL 43, K 5.2, creatinine 0.95 Labs (2/16): K 4.5, creatinine 1.08, BNP 113 Labs (4/16): LDL 50, HDL 56, TGs 179 Labs (9/16): K 5.3, creatinine 1.09 Labs (10/16): LDL 75, HDL 56 Labs (2/17): K 5.2, creatinine 1.07 Labs (4/17): K 5.1, creatinine 1.07 Labs (5/17): K 4.5, creatinine 1.01, LDL 96, HDL 56, BNP 70 Labs (03/2016) K 4.9, creatinine 1.07 Labs (7/18): K 4.9, creatinine 1.15 Labs (11/18): K 5.1, creatinine 1.11, LDL 36 Labs (1/19): K 4.9, creatinine 1.22 Labs (9/19): LDL 108 Labs (10/19): hgb 14.5, K 4.3, creatinine 1.03 Labs 12/06/2017: K 4.7 Creatinine 1.7 Labs (3/20): K 4.8, creatinine 1.39, hgb 12.4 plts 226 Labs (11/20): K 4.2, creatinine 1.01, LDL 38 Labs (12/20): K 3.5, creatinine 1.8  PMH: 1. Diabetic gastroparesis 2. Type II diabetes 3. HTN 4. Morbid obesity 5. CAD: s/p CABG in 12/11 after prior PCIs.  LIMA-LAD, SVG-D, seq SVG-OM1 and OM2, SVG-PDA. Adenosine Cardiolite (8/14) with EF 53% and a small reversible apical defect with a medium, partially reversible  inferior defect.  LHC (9/14) with patent SVG-D, LIMA-LAD (50% distal LAD), and sequential SVG-OM branches; the SVG-RCA and the native RCA were occluded.  This was managed medically initially, but with ongoing exertional chest pain, it was decided to open CTO.  Patient had CTO opening with 4 overlapping Xience DES in the RCA in 5/15.   - LHC (4/17): SVG-D patent, LIMA-LAD patent, distal LAD with several 40-50% stenoses, sequential SVG-OM1 and PLOM patent with 50% proximal stenosis (not flow limiting), SVG-RCA TO with patent RCA stents.  - LHC (6/18): 4/5 grafts patent (SVG-RCA TO, same as past), RCA stents patent => no change.  - LHC (9/19): Sequential SVG-PLOM/OM1 with 80% proximal stenosis, s/p DES.  6. Atypical migraines 7. OHS/OSA: Intolerant of CPAP. Uses oxygen with exertion and at night.  8. GERD with hiatal hernia 9. OA 10. Chronic systolic CHF: Echo (96/78) with EF 50-55%, grade II diastolic dysfunction, mild-moderate MR.  Echo (8/14) with  EF 55-60%, grade II diastolic dysfunction, mildly increased aortic valve gradient (mean 12 mmHg) but valve opens well, mild MR and mild RV dilation.  Echo (9/15) with EF 60-65%, grade II diastolic dysfunction, mild aortic stenosis, mild mitral stenosis, mild to moderate mitral regurgitation, mildly dilated RV with normal systolic function, PA systolic pressure 46 mmHg.  - RHC (4/17): mean RA 9, PA 38/15 mean 27, mean PCWP 9, CI 2.5.  - Echo (5/17): EF 55-60%, mild LVH, mildly dilated RV with low normal systolic function, moderate TR, PASP 61 mmHg - Echo (1/19): EF 55-60%, moderate diastolic dysfunction, PASP 50 mmHg, mildly dilated RV, mild AS, mild-moderate MR. - RHC (9/19): mean RA 11, PA 34/12, mean PCWP 15, CI 2.85 - Echo (3/20): EF 25-30% with mid-apical severe hypokinesis.  - Echo (11/20): EF 45-50%, moderately dilated RV with moderately decreased systolic function, moderate-severe MR, moderate TR.  - TEE (12/20): EF 50-55%, septal-lateral  dyssynchrony, moderate RV dilation/mildly decreased function, peak RV-RA gradient 51 mmHg, moderate MR.  11. CKD 12. Chronic LBBB 13. Anxiety 14. Carotid stenosis: Followed by [facility], >01% LICA stenosis 09/32.  She had left carotid stent in 2/15. Carotid dopplers 8/15 with no significant disease. Carotid dopplers (4/16): < 40% RICA stenosis.  15. Cerebrovascular disease: CVA 2/14 with right posterior cerebral artery territory ischemic infarction. Cerebral angiogram in 6/14 showed 70% right vertebral ostial stenosis, 35-57% LICA stenosis, > 32% proximal left posterior cerebral artery stenosis, posterior communicating artery aneurysm.  Patient has had episodes of transient expressive aphasia.  Carotid dopplers (20/25) showed >42% LICA stenosis. She had a left carotid stent 2/15.  Possible CVA in 7/15. -  MRI/MRA in May 2018 that showed moderately severe stenosis of the supraclinoid segment of the right internal carotid artery, progressed since the 2016 MR angiogram and severe basilar artery stenosis.  16. Positional vertigo (suspected) 17. Palpitations: Holter (6/15) with rare PVCs/PACs.  - Event monitor (2/18): NSR, occasional PVCs 18. Dyspnea with Brilinta. 19. Melanoma: On face, s/p excision.   20. Aortic stenosis: Mild.  21. Holter (8/16) with no significant arrhythmias.  22. Lower extremity arterial dopplers (9/16) were normal.  23. Sleep study (4/18): No significant OSA.  24. Atrial fibrillation: Paroxysmal - Amiodarone stopped, ?lung toxicity.  25. H/o ITP in 3/20.  16. Partial seizures 17. Mitral regurgitation: Moderate on TEE in 12/20.    Current Outpatient Medications  Medication Sig Dispense Refill  . acetaminophen (TYLENOL) 500 MG tablet Take 500 mg by mouth every 6 (six) hours as needed for mild pain, moderate pain, fever or headache.    . albuterol (VENTOLIN HFA) 108 (90 Base) MCG/ACT inhaler Inhale 2 puffs into the lungs every 4 (four) hours as needed for wheezing or shortness of  breath.     . Alirocumab (PRALUENT) 150 MG/ML SOAJ Inject 150 mg/mL into the skin every 14 (fourteen) days.    . ALPRAZolam (XANAX) 0.5 MG tablet Take 1-2 tablets (0.5-1 mg total) by mouth See admin instructions. Take one tablet (0.5 mg) by mouth every morning and two tablets (1 mg) at night, may also take 1 tablet (0.5 mg) midday as needed for anxiety    . amLODipine (NORVASC) 5 MG tablet Take 10 mg by mouth daily.    Marland Kitchen apixaban (ELIQUIS) 5 MG TABS tablet Take 1 tablet (5 mg total) by mouth 2 (two) times daily. 60 tablet 6  . bisacodyl (DULCOLAX) 5 MG EC tablet Take 1 tablet (5 mg total) by mouth daily as needed for moderate constipation.  30 tablet 1  . buPROPion (WELLBUTRIN XL) 150 MG 24 hr tablet Take 150 mg by mouth every morning.    . clopidogrel (PLAVIX) 75 MG tablet Take 75 mg by mouth at bedtime.    Marland Kitchen denosumab (PROLIA) 60 MG/ML SOSY injection Inject 60 mg into the skin every 6 (six) months.     . DULoxetine (CYMBALTA) 30 MG capsule Take 1 capsule (30 mg total) by mouth daily. 30 capsule 0  . ezetimibe (ZETIA) 10 MG tablet Take 1 tablet (10 mg total) by mouth at bedtime.    . Fluticasone-Salmeterol (ADVAIR) 250-50 MCG/DOSE AEPB Inhale 1 puff into the lungs daily.    Marland Kitchen gabapentin (NEURONTIN) 300 MG capsule Take 1-2 capsules (300-600 mg total) by mouth See admin instructions. 38m in the mornings and 6046mat bedtime    . HYDROcodone-acetaminophen (NORCO/VICODIN) 5-325 MG tablet Take 1 tablet by mouth at bedtime as needed for moderate pain.    . Insulin Glargine (LANTUS SOLOSTAR) 100 UNIT/ML Solostar Pen Inject 38-44 Units into the skin See admin instructions. Inject 38 units subcutaneously daily at bedtime, increase to 44 units for CBG >200    . insulin lispro (HUMALOG KWIKPEN) 100 UNIT/ML KwikPen Inject 14-18 Units into the skin See admin instructions. Inject 14 units subcutaneously prior to breakfast and supper; add 4 units for CBG >200    . ipratropium-albuterol (DUONEB) 0.5-2.5 (3) MG/3ML  SOLN Take 3 mLs by nebulization 2 (two) times daily as needed (asthma).    . losartan (COZAAR) 25 MG tablet Take 12.5 mg by mouth daily.    . metolazone (ZAROXOLYN) 2.5 MG tablet Take 1 tablet (2.5 mg total) by mouth daily as needed (As directed by heart failure clinic. Call heart failure office for direction when experiencing overnight weight gain).    . Multiple Vitamin (MULTIVITAMIN WITH MINERALS) TABS tablet Take 1 tablet by mouth every morning. Centrum - Women over 5513  . Multiple Vitamins-Minerals (OCUVITE EYE HEALTH FORMULA) CAPS Take 1 capsule by mouth every morning.    . nitroGLYCERIN (NITROSTAT) 0.3 MG SL tablet Place 1 tablet (0.3 mg total) under the tongue every 5 (five) minutes x 3 doses as needed for chest pain. 9 tablet 1  . ondansetron (ZOFRAN-ODT) 4 MG disintegrating tablet Take 4 mg by mouth 2 (two) times daily as needed for nausea or vomiting.    . OXYGEN Inhale 2 L into the lungs at bedtime as needed (shortness of breath).     . pantoprazole (PROTONIX) 40 MG tablet Take 1 tablet (40 mg total) by mouth daily. 30 tablet 0  . Polyvinyl Alcohol-Povidone (REFRESH OP) Place 1 drop into both eyes daily as needed (redness/ dry eyes).    . potassium chloride SA (KLOR-CON) 20 MEQ tablet Take 40 mEq by mouth 2 (two) times daily.    . Pyridoxine HCl (VITAMIN B-6 PO) Take 1 tablet by mouth every morning.    . ranolazine (RANEXA) 1000 MG SR tablet Take 1 tablet (1,000 mg total) by mouth 2 (two) times daily. 180 tablet 1  . rosuvastatin (CRESTOR) 40 MG tablet Take 1 tablet (40 mg total) by mouth at bedtime. 30 tablet 0  . spironolactone (ALDACTONE) 25 MG tablet Take 0.5 tablets (12.5 mg total) by mouth daily.    . Marland Kitchenorsemide (DEMADEX) 20 MG tablet Take 4 tablets (80 mg) by mouth every morning, take 2 tablets (40 mg) every evening 180 tablet 2  . traZODone (DESYREL) 50 MG tablet Take 1 tablet (50 mg total) by  mouth at bedtime. 30 tablet 1  . triamcinolone cream (KENALOG) 0.1 % Apply 1  application topically daily as needed (crusty spots on arms).    . vitamin B-12 (CYANOCOBALAMIN) 1000 MCG tablet Take 1,000 mcg by mouth every morning.     . zolpidem (AMBIEN) 10 MG tablet Take 0.5 tablets (5 mg total) by mouth at bedtime. 30 tablet 0   No current facility-administered medications for this encounter.    Allergies:   Amoxicillin, Brilinta [ticagrelor], Erythromycin, Flagyl [metronidazole], Penicillins, Isosorbide mononitrate [isosorbide nitrate], Jardiance [empagliflozin], Metformin and related, Tape, and Erythromycin base   Social History:  The patient  reports that she has never smoked. She has never used smokeless tobacco. She reports that she does not drink alcohol or use drugs.   Family History:  The patient's family history includes AAA (abdominal aortic aneurysm) in her father and mother; Cancer in her sister; Deep vein thrombosis in her father and mother; Diabetes in her father, paternal aunt, paternal grandmother, paternal uncle, and paternal uncle; Heart attack in her father; Heart attack (age of onset: 60) in her paternal grandfather; Heart disease in her father and paternal uncle; Hyperlipidemia in her father; Hypertension in her father, mother, and sister.   ROS:  Please see the history of present illness.   All other systems are personally reviewed and negative.   Exam:   BP 112/65   Pulse 63   Wt 103.6 kg (228 lb 6.4 oz)   SpO2 98%   BMI 41.77 kg/m  General: NAD Neck: No JVD, no thyromegaly or thyroid nodule.  Lungs: Clear to auscultation bilaterally with normal respiratory effort. CV: Nondisplaced PMI.  Heart regular S1/S2 with paradoxically split S2, no S3/S4, no murmur.  No peripheral edema.  No carotid bruit.  Normal pedal pulses.  Abdomen: Soft, nontender, no hepatosplenomegaly, no distention.  Skin: Intact without lesions or rashes.  Neurologic: Alert and oriented x 3.  Psych: Normal affect. Extremities: No clubbing or cyanosis.  HEENT: Normal.    Recent Labs: 12/23/2018: ALT 17 01/11/2019: B Natriuretic Peptide 473.7 01/17/2019: TSH 0.982 01/18/2019: Hemoglobin 9.7; Platelets 188 01/19/2019: Magnesium 1.9 01/29/2019: BUN 17; Creatinine, Ser 1.73; Potassium 4.2; Sodium 137  Personally reviewed   Wt Readings from Last 3 Encounters:  01/29/19 103.6 kg (228 lb 6.4 oz)  01/19/19 99.9 kg (220 lb 3.8 oz)  01/12/19 103.1 kg (227 lb 6.4 oz)     ASSESSMENT AND PLAN:  1. CAD: Occluded native RCA and SVG-RCA. Status post opening of CTO RCA with 4 overlapping Xience DES in 5/15. DES to sequential SVG-OM1/PLOM in 9/19.  She has been on Plavix and Eliquis.  Having increased anginal-type chest pain since stopping Toprol XL.  - Continuing on Plavix (neuro has wanted her to stay on this for cerebrovascular disease).   - She has not tolerated Imdur in the past. Off Toprol XL with bradycardia.  I will have her increase ranolazine to 1000 mg bid.  - I will arrange for Lexiscan Cardiolite to assess for ischemia. If there is significant ischemia, reasonable to cath (will need to be careful with recent AKI).  2. Chronic systolic CHF: Echo in 6/30 with EF 55-60%, moderate diastolic dysfunction. Cardiomems placed 01/16/18.  Echo in 3/20 in setting of severe PNA with intubation showed EF 25-30%, mid-apical LV severe hypokinesis.  Possible stress (Takotsubo-type) cardiomyopathy related to severe medical illness versus progression of CAD.  She has baseline chronic LBBB which could play a role in cardiomyopathy as well (LBBB cardiomyopathy).  Repeat echo in 11/20 with EF up to 45-50%, moderately dysfunctional RV.  TEE in 12/20 with EF 50-55% with septal-lateral dyssynchrony and mildly dysfunctional RV.  NYHA class III symptoms chronically but not volume overloaded on exam.  - She will start sending Cardiomems transmissions again once her Wifi is upgraded.   - Continue torsemide 80 qam/40 qpm with KCl 40 qam/20 qpm.  BMET today.   - Continue spironolactone  12.5 mg daily.  - Continue losartan 12.5 mg daily.  3. Hyperlipidemia: She remains on Zetia, Crestor and Praluent. Lipids at goal in 11/20.  4. Hypertension: BP controlled.  5. Cerebrovascular disease: She has history of CVA and had left carotid stent in 2/15. Followed by [facility].  Most recent MRA head showed severe stenosis of supraclinoid RICA and severe basilar artery stenosis.  - Neurology would like her to continue Plavix for this despite concomitant apixaban use.  6. OHS/OSA: Cannot tolerate CPAP.  Uses oxygen with exertion and at night. No change. 7. Atrial fibrillation: Paroxysmal, no recent palpitations. 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  She is in NSR today.  - She has completed course of prednisone for amiodarone lung toxicity.    - Continue apixaban 5 mg bid.  8. Obesity: Weight has trended down over the last year.  9. Mitral regurgitation: Moderate to severe MR on last echo in 11/20 but moderate on 12/20 TEE.  No significant enough for Mitraclip.  10. Bradycardia: Now off Toprol XL.   - Awaiting result from 2 wk Zio patch.   Needs PT, I will refer.  Followup with NP/PA in 3 wks.   Signed, Loralie Champagne, MD  01/30/2019  [place] [address] [facility] [place] [address] [phone] (office) [phone] (fax)

## 2019-01-30 NOTE — Telephone Encounter (Signed)
BP/pulse sound like they were ok.  Will need to look at her 2 wk monitor, waiting for it to be processed.  Call if she has additional episodes.

## 2019-01-30 NOTE — ED Notes (Signed)
[phone] [name] [name] pts husband would like an update on pt, aware she is in waiting. Husband is has been in the parking lot waiting

## 2019-01-31 NOTE — ED Notes (Signed)
Patient verbalizes understanding of discharge instructions. Opportunity for questioning and answers were provided. Armband removed by staff, pt discharged from ED.  

## 2019-01-31 NOTE — ED Provider Notes (Addendum)
[facility] EMERGENCY DEPARTMENT Provider Note   CSN: [id number] Arrival date & time: 01/30/19  1554     History Chief Complaint  Patient presents with  . Chest Pain    [name] [name] is a 73 y.o. female.  HPI      [name] [name] is a 73 y.o. female, with a history of LBBB, CHF, CKD, heart murmur, HTN, hyperlipidemia, CAD, stroke, DM, bradycardia, CABG, presenting to the ED with concern for bradycardia noted by home health nurse yesterday. They noted pulse rate into the 40s.  She states this has been noted in the past, she has just finished wearing a heart monitor for 2 weeks, but has not yet been given the results for this.  To this point, she was removed from Toprol-XL due to instances of bradycardia.  She states the only reason she came to the ED was because she was urged to do so by the home health nurse.  She has not felt any different from her baseline. She has had chest pressure intermittent for the past several months.  Currently rates this 3/10, located in the central left chest, nonradiating from these locations.  She has followed closely with her cardiologist on this matter.  It has been classified as anginal discomfort.  She saw her cardiologist, Dr. [name], Dec 28.  She has a stress test scheduled for January 5 to evaluate the need for cardiac cath.  Denies fever/chills, cough, shortness of breath, abdominal pain, dizziness, syncope, extremity numbness/weakness, acute lower extremity edema, N/V/D, or any other complaints.  Past Medical History:  Diagnosis Date  . Anemia    hx  . Anxiety   . Asthma   . Basal cell carcinoma 05/2014   "left shoulder"  . Bundle branch block, left    chronic/notes 07/18/2013  . CHF (congestive heart failure) ([place])   . Chronic insomnia 05/06/2015  . Chronic kidney disease    frequency, sees dr Jamal Maes every 4 to 6 months (01/16/2018)  . Chronic lower back pain   . Claustrophobia   . Common migraine 05/14/2014  .  Coronary artery disease    MI in 2001, 2002, 2006, 2011, 2014  . Depression   . Diabetic peripheral neuropathy ([place]) 01/12/2019  . GERD (gastroesophageal reflux disease)   . H/O hiatal hernia   . Headache    "at least 2/month" (01/16/2018)  . Heart murmur   . Hyperlipidemia   . Hypertension   . Memory change 05/14/2014  . Migraine    "5-6/year"  (01/16/2018)  . Obesity 01-2010  . Obstructive sleep apnea    "can't wear machine; I have claustrophobia" (01/16/2018), states she had a 2nd sleep study and does not have sleep apnea, her O2 decreases and now is on 2 L of O2 at night.  . On home oxygen therapy    "2L at night and prn during daytime" (01/16/2018)  . Osteoarthritis    "knees and hands" (01/16/2018)  . Peripheral vascular disease (HCC)    ? numbness, tingling arms and legs  . PONV (postoperative nausea and vomiting)   . Stroke [facility])    2014, 2015, 2016  . Type II diabetes mellitus ([place])   . Ventral hernia    hx of    Patient Active Problem List   Diagnosis Date Noted  . Symptomatic bradycardia 01/16/2019  . Normocytic anemia 01/16/2019  . Diabetic peripheral neuropathy ([place]) 01/12/2019  . Neurologic abnormality 12/25/2018  . Debility 09/21/2018  . Hypotension  09/18/2018  . Bradycardia 09/18/2018  . AKI (acute kidney injury) ([place]) 09/18/2018  . Pressure injury of skin 09/18/2018  . Ischemic cerebrovascular accident (CVA) of frontal lobe ([place]) 09/13/2018  . Fall 09/09/2018  . History of CVA (cerebrovascular accident) 09/09/2018  . Type II diabetes mellitus with renal manifestations ([place]) 09/09/2018  . CKD (chronic kidney disease), stage III 09/09/2018  . Sepsis ([place]) 09/09/2018  . Depression with anxiety 09/09/2018  . Slurred speech 09/09/2018  . Ankle fracture, left 05/16/2018  . Amiodarone pulmonary toxicity   . Physical deconditioning   . 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 Acute on chronic congestive heart failure ([place])   . Chronic combined systolic  (congestive) and diastolic (congestive) heart failure ([place]) 01/16/2018  . Chronic systolic CHF (congestive heart failure) ([place]) 01/16/2018  . Atrial fibrillation ([place]) 10/21/2017  . CAD (coronary artery disease) of bypass graft 10/20/2017  . Coronary artery disease of bypass graft of native heart with stable angina pectoris ([place])   . Chronic low back pain 08/24/2016  . Pain of left thumb 08/05/2016  . Nocturnal hypoxia 06/02/2016  . Hoarseness 04/05/2016  . Laryngopharyngeal reflux (LPR) 04/05/2016  . Pharyngoesophageal dysphagia 04/05/2016  . Angina decubitus ([place]) 05/22/2015  . Chronic insomnia 05/06/2015  . Common migraine 05/14/2014  . Abnormality of gait 05/14/2014  . Memory change 05/14/2014  . Aneurysm, cerebral, nonruptured 05/14/2014  . Cramp of limb-Left neck 05/10/2014  . Hematemesis with nausea   . Vomiting blood   . Dizziness and giddiness 09/21/2013  . Atypical chest pain 07/18/2013  . Unstable angina ([place]) 04/09/2013  . Carotid artery stenosis, symptomatic 03/20/2013  . Cerebrovascular disease 07/10/2012  . Cerebral artery occlusion with cerebral infarction ([place]) 07/10/2012  . Mitral regurgitation 04/15/2012  . TIA (transient ischemic attack) 04/15/2012  . Occlusion and stenosis of carotid artery without mention of cerebral infarction 08/18/2011  . Bariatric surgery status 06/17/2011  . Speech abnormality 03/22/2011  . Dyspnea 02/24/2011  . PAPILLARY MUSCLE DYSFUNCTION, NON-RHEUMATIC 10/09/2008  . UNSPECIFIED VITAMIN D DEFICIENCY 10/24/2007  . MYOCARDIAL INFARCTION, HX OF 10/24/2007  . PERSISTENT VOMITING 10/24/2007  . OSTEOARTHRITIS 10/24/2007  . MIGRAINES, HX OF 10/24/2007  . Diabetes mellitus type 2, controlled, with complications ([place]) 01/65/5374  . Hyperlipemia 01/16/2007  . Obesity-post failed open gastroplasty 1984  01/16/2007  . OBSTRUCTIVE SLEEP APNEA 01/16/2007  . Essential hypertension 01/16/2007  . Coronary atherosclerosis 01/16/2007  . Asthma  01/16/2007  . GERD 01/16/2007  . VENTRAL HERNIA 01/16/2007    Past Surgical History:  Procedure Laterality Date  . ANKLE FRACTURE SURGERY Left 1970's  . APPENDECTOMY  1970's   w/hysterectomy  . BASAL CELL CARCINOMA EXCISION Left 05/2014   "shoulder" (01/16/2018)  . CARDIAC CATHETERIZATION  10/10/2012   Dr [name].  Marland Kitchen CARDIAC CATHETERIZATION N/A 05/29/2015   Procedure: Right/Left Heart Cath and Coronary/Graft Angiography;  Surgeon: Larey Dresser, MD;  Location: [facility] CV LAB;  Service: Cardiovascular;  Laterality: N/A;  . CAROTID ENDARTERECTOMY Left 03/2013  . CAROTID STENT INSERTION Left 03/20/2013   Procedure: CAROTID STENT INSERTION;  Surgeon: [name], MD;  Location: [facility] CATH LAB;  Service: Cardiovascular;  Laterality: Left;  internal carotid  . CEREBRAL ANGIOGRAM N/A 04/05/2011   Procedure: CEREBRAL ANGIOGRAM;  Surgeon: Angelia Mould, MD;  Location: [facility] CATH LAB;  Service: Cardiovascular;  Laterality: N/A;  . CHOLECYSTECTOMY OPEN  2004  . CORONARY ANGIOPLASTY WITH STENT PLACEMENT  01,02,05,06,07,08,11; 04/24/2013   "I've probably got ~ 10 stents by now" (04/24/2013)  .  CORONARY ANGIOPLASTY WITH STENT PLACEMENT  06/13/2013   "got 4 stents today" (06/13/2013)  . CORONARY ARTERY BYPASS GRAFT  1220/11   "CABG X5"  . CORONARY STENT INTERVENTION N/A 10/20/2017   Procedure: CORONARY STENT INTERVENTION;  Surgeon: Troy Sine, MD;  Location: [facility] CV LAB;  Service: Cardiovascular;  Laterality: N/A;  . ESOPHAGOGASTRODUODENOSCOPY  08/03/2011   Procedure: ESOPHAGOGASTRODUODENOSCOPY (EGD);  Surgeon: Shann Medal, MD;  Location: [facility];  Service: General;  Laterality: N/A;  . ESOPHAGOGASTRODUODENOSCOPY (EGD) WITH PROPOFOL N/A 03/11/2014   Procedure: ESOPHAGOGASTRODUODENOSCOPY (EGD) WITH PROPOFOL;  Surgeon: Lafayette Dragon, MD;  Location: [facility] ENDOSCOPY;  Service: Endoscopy;  Laterality: N/A;  . FRACTURE SURGERY    . gall stone removal  05/2003  . GASTRIC RESTRICTION SURGERY   1984   "stapeling"  . HERNIA REPAIR  2004   "in my stomach; had OR on it twice", wire mesh on 1 hernia  . LEFT HEART CATH AND CORS/GRAFTS ANGIOGRAPHY N/A 07/09/2016   Procedure: LEFT HEART CATH AND CORS/GRAFTS ANGIOGRAPHY;  Surgeon: Larey Dresser, MD;  Location: [facility] CV LAB;  Service: Cardiovascular;  Laterality: N/A;  . ORIF ANKLE FRACTURE Left 05/16/2018  . ORIF ANKLE FRACTURE Left 05/16/2018   Procedure: OPEN REDUCTION INTERNAL FIXATION (ORIF) Left ankle with possible syndesmosis fixation;  Surgeon: Nicholes Stairs, MD;  Location: [facility];  Service: Orthopedics;  Laterality: Left;  144min  . OVARY SURGERY  1970's   "tumor removed"  . PERCUTANEOUS CORONARY STENT INTERVENTION (PCI-S) N/A 06/13/2013   Procedure: PERCUTANEOUS CORONARY STENT INTERVENTION (PCI-S);  Surgeon: [name], MD;  Location: [facility] CATH LAB;  Service: Cardiovascular;  Laterality: N/A;  . PERCUTANEOUS STENT INTERVENTION N/A 04/24/2013   Procedure: PERCUTANEOUS STENT INTERVENTION;  Surgeon: [name], MD;  Location: [facility] CATH LAB;  Service: Cardiovascular;  Laterality: N/A;  . RIGHT/LEFT HEART CATH AND CORONARY/GRAFT ANGIOGRAPHY N/A 10/20/2017   Procedure: RIGHT/LEFT HEART CATH AND CORONARY/GRAFT ANGIOGRAPHY;  Surgeon: Larey Dresser, MD;  Location: [facility] CV LAB;  Service: Cardiovascular;  Laterality: N/A;  . ROOT CANAL  10/2000  . TEE WITHOUT CARDIOVERSION N/A 01/18/2019   Procedure: TRANSESOPHAGEAL ECHOCARDIOGRAM (TEE);  Surgeon: Larey Dresser, MD;  Location: [facility] ENDOSCOPY;  Service: Cardiovascular;  Laterality: N/A;  . TIBIA FRACTURE SURGERY Right 1970's   rods and pins  . TOOTH EXTRACTION     "1 on the upper; wisdom tooth on the lower" (01/16/2018)  . TOTAL ABDOMINAL HYSTERECTOMY  1970's   w/ appendectomy     OB History   No obstetric history on file.     Family History  Problem Relation Age of Onset  . Heart disease Father        Heart Disease before age 24  . Diabetes  Father   . Hyperlipidemia Father   . Hypertension Father   . Heart attack Father   . Deep vein thrombosis Father   . AAA (abdominal aortic aneurysm) Father   . Hypertension Mother   . Deep vein thrombosis Mother   . AAA (abdominal aortic aneurysm) Mother   . Cancer Sister        Ovarian  . Hypertension Sister   . Diabetes Paternal Grandmother   . Heart disease Paternal Uncle   . Diabetes Paternal Uncle   . Heart attack Paternal Grandfather 74       died of MI at 59  . Diabetes Paternal Aunt   . Diabetes Paternal Uncle   . Colon cancer Neg Hx  Social History   Tobacco Use  . Smoking status: Never Smoker  . Smokeless tobacco: Never Used  Substance Use Topics  . Alcohol use: Never  . Drug use: Never    Home Medications Prior to Admission medications   Medication Sig Start Date End Date Taking? Authorizing Provider  acetaminophen (TYLENOL) 500 MG tablet Take 500 mg by mouth every 6 (six) hours as needed for mild pain, moderate pain, fever or headache.    [provider]  albuterol (VENTOLIN HFA) 108 (90 Base) MCG/ACT inhaler Inhale 2 puffs into the lungs every 4 (four) hours as needed for wheezing or shortness of breath.     [provider]  Alirocumab (PRALUENT) 150 MG/ML SOAJ Inject 150 mg/mL into the skin every 14 (fourteen) days.    [provider]  ALPRAZolam Duanne Moron) 0.5 MG tablet Take 1-2 tablets (0.5-1 mg total) by mouth See admin instructions. Take one tablet (0.5 mg) by mouth every morning and two tablets (1 mg) at night, may also take 1 tablet (0.5 mg) midday as needed for anxiety 01/19/19   Aline August, MD  amLODipine (NORVASC) 5 MG tablet Take 10 mg by mouth daily. 01/25/19   [provider]  apixaban (ELIQUIS) 5 MG TABS tablet Take 1 tablet (5 mg total) by mouth 2 (two) times daily. 09/28/18   Angiulli, Lavon Paganini, PA-C  bisacodyl (DULCOLAX) 5 MG EC tablet Take 1 tablet (5 mg total) by mouth daily as needed for moderate  constipation. 05/19/18 05/19/19  Nicholes Stairs, MD  buPROPion (WELLBUTRIN XL) 150 MG 24 hr tablet Take 150 mg by mouth every morning. 01/25/19   [provider]  clopidogrel (PLAVIX) 75 MG tablet Take 75 mg by mouth at bedtime.    [provider]  denosumab (PROLIA) 60 MG/ML SOSY injection Inject 60 mg into the skin every 6 (six) months.     [provider]  DULoxetine (CYMBALTA) 30 MG capsule Take 1 capsule (30 mg total) by mouth daily. 09/28/18   Angiulli, Lavon Paganini, PA-C  ezetimibe (ZETIA) 10 MG tablet Take 1 tablet (10 mg total) by mouth at bedtime. 04/10/18   Cherene Altes, MD  Fluticasone-Salmeterol (ADVAIR) 250-50 MCG/DOSE AEPB Inhale 1 puff into the lungs daily.    [provider]  gabapentin (NEURONTIN) 300 MG capsule Take 1-2 capsules (300-600 mg total) by mouth See admin instructions. 300mg  in the mornings and 600mg  at bedtime 01/19/19   Aline August, MD  HYDROcodone-acetaminophen (NORCO/VICODIN) 5-325 MG tablet Take 1 tablet by mouth at bedtime as needed for moderate pain.    [provider]  Insulin Glargine (LANTUS SOLOSTAR) 100 UNIT/ML Solostar Pen Inject 38-44 Units into the skin See admin instructions. Inject 38 units subcutaneously daily at bedtime, increase to 44 units for CBG >200    [provider]  insulin lispro (HUMALOG KWIKPEN) 100 UNIT/ML KwikPen Inject 14-18 Units into the skin See admin instructions. Inject 14 units subcutaneously prior to breakfast and supper; add 4 units for CBG >200    [provider]  ipratropium-albuterol (DUONEB) 0.5-2.5 (3) MG/3ML SOLN Take 3 mLs by nebulization 2 (two) times daily as needed (asthma). 01/19/19   Aline August, MD  losartan (COZAAR) 25 MG tablet Take 12.5 mg by mouth daily.    [provider]  metolazone (ZAROXOLYN) 2.5 MG tablet Take 1 tablet (2.5 mg total) by mouth daily as needed (As directed by heart failure clinic. Call heart failure office for  direction when experiencing overnight weight  gain). 12/26/18   Geradine Girt, DO  Multiple Vitamin (MULTIVITAMIN WITH MINERALS) TABS tablet Take 1 tablet by mouth every morning. Centrum - Women over 69    [provider]  Multiple Vitamins-Minerals ([place]) CAPS Take 1 capsule by mouth every morning.    [provider]  nitroGLYCERIN (NITROSTAT) 0.3 MG SL tablet Place 1 tablet (0.3 mg total) under the tongue every 5 (five) minutes x 3 doses as needed for chest pain. 09/15/17   Georgiana Shore, NP  ondansetron (ZOFRAN-ODT) 4 MG disintegrating tablet Take 4 mg by mouth 2 (two) times daily as needed for nausea or vomiting.    [provider]  OXYGEN Inhale 2 L into the lungs at bedtime as needed (shortness of breath).     [provider]  pantoprazole (PROTONIX) 40 MG tablet Take 1 tablet (40 mg total) by mouth daily. 09/28/18   Angiulli, Lavon Paganini, PA-C  Polyvinyl Alcohol-Povidone (REFRESH OP) Place 1 drop into both eyes daily as needed (redness/ dry eyes).    [provider]  potassium chloride SA (KLOR-CON) 20 MEQ tablet Take 40 mEq by mouth 2 (two) times daily.    [provider]  Pyridoxine HCl (VITAMIN B-6 PO) Take 1 tablet by mouth every morning.    [provider]  ranolazine (RANEXA) 1000 MG SR tablet Take 1 tablet (1,000 mg total) by mouth 2 (two) times daily. 01/29/19   Larey Dresser, MD  rosuvastatin (CRESTOR) 40 MG tablet Take 1 tablet (40 mg total) by mouth at bedtime. 09/28/18   Angiulli, Lavon Paganini, PA-C  spironolactone (ALDACTONE) 25 MG tablet Take 0.5 tablets (12.5 mg total) by mouth daily. 01/19/19   Aline August, MD  torsemide (DEMADEX) 20 MG tablet Take 4 tablets (80 mg) by mouth every morning, take 2 tablets (40 mg) every evening 01/11/19   Larey Dresser, MD  traZODone (DESYREL) 50 MG tablet Take 1 tablet (50 mg total) by mouth at bedtime. 01/12/19   Kathrynn Ducking, MD  triamcinolone cream  (KENALOG) 0.1 % Apply 1 application topically daily as needed (crusty spots on arms).    [provider]  vitamin B-12 (CYANOCOBALAMIN) 1000 MCG tablet Take 1,000 mcg by mouth every morning.     [provider]  zolpidem (AMBIEN) 10 MG tablet Take 0.5 tablets (5 mg total) by mouth at bedtime. 12/26/18   Geradine Girt, DO    Allergies    Amoxicillin, Brilinta [ticagrelor], Erythromycin, Flagyl [metronidazole], Penicillins, Isosorbide mononitrate [isosorbide nitrate], Jardiance [empagliflozin], Metformin and related, Tape, and Erythromycin base  Review of Systems   Review of Systems  Constitutional: Negative for chills and fever.  Eyes: Negative for visual disturbance.  Respiratory: Negative for cough and shortness of breath.   Cardiovascular: Negative for leg swelling.       Chest pressure  Gastrointestinal: Negative for abdominal pain, diarrhea, nausea and vomiting.  Musculoskeletal: Negative for back pain.  Neurological: Negative for dizziness, syncope, weakness, light-headedness and numbness.  All other systems reviewed and are negative.   Physical Exam Updated Vital Signs BP (!) 168/55   Pulse 73   Temp 98.3 F (36.8 C) (Oral)   Resp 16   Ht 5\' 2"  (1.575 m)   Wt 103.6 kg   SpO2 100%   BMI 41.78 kg/m   Physical Exam Vitals and nursing note reviewed.  Constitutional:      General: She is not in acute distress.    Appearance: She is  well-developed. She is not diaphoretic.  HENT:     Head: Normocephalic and atraumatic.     Mouth/Throat:     Mouth: Mucous membranes are moist.     Pharynx: Oropharynx is clear.  Eyes:     Conjunctiva/sclera: Conjunctivae normal.  Cardiovascular:     Rate and Rhythm: Normal rate and regular rhythm.     Pulses: Normal pulses.          Radial pulses are 2+ on the right side and 2+ on the left side.       Posterior tibial pulses are 2+ on the right side and 2+ on the left side.     Heart sounds: Normal heart sounds.      Comments: Tactile temperature in the extremities appropriate and equal bilaterally. Pulmonary:     Effort: Pulmonary effort is normal. No respiratory distress.     Breath sounds: Normal breath sounds.  Abdominal:     Palpations: Abdomen is soft.     Tenderness: There is no abdominal tenderness. There is no guarding.  Musculoskeletal:     Cervical back: Neck supple.     Right lower leg: 1+ Pitting Edema present.     Left lower leg: 1+ Pitting Edema present.  Lymphadenopathy:     Cervical: No cervical adenopathy.  Skin:    General: Skin is warm and dry.  Neurological:     Mental Status: She is alert.  Psychiatric:        Mood and Affect: Mood and affect normal.        Speech: Speech normal.        Behavior: Behavior normal.     ED Results / Procedures / Treatments   Labs (all labs ordered are listed, but only abnormal results are displayed) Labs Reviewed  BASIC METABOLIC PANEL - Abnormal; Notable for the following components:      Result Value   Chloride 94 (*)    Glucose, Bld 142 (*)    Creatinine, Ser 1.64 (*)    GFR calc non Af Amer 31 (*)    GFR calc Af Amer 36 (*)    All other components within normal limits  CBC - Abnormal; Notable for the following components:   Hemoglobin 10.1 (*)    HCT 34.3 (*)    MCH 24.3 (*)    MCHC 29.4 (*)    RDW 19.1 (*)    All other components within normal limits  PROTIME-INR - Abnormal; Notable for the following components:   Prothrombin Time 17.8 (*)    INR 1.5 (*)    All other components within normal limits  TROPONIN I (HIGH SENSITIVITY)  TROPONIN I (HIGH SENSITIVITY)    Hemoglobin  Date Value Ref Range Status  01/30/2019 10.1 (L) 12.0 - 15.0 g/dL Final  01/18/2019 9.7 (L) 12.0 - 15.0 g/dL Final  01/17/2019 9.4 (L) 12.0 - 15.0 g/dL Final  01/16/2019 9.9 (L) 12.0 - 15.0 g/dL Final    BUN  Date Value Ref Range Status  01/30/2019 18 8 - 23 mg/dL Final  01/29/2019 17 8 - 23 mg/dL Final  01/22/2019 15 8 - 23 mg/dL Final    01/19/2019 19 8 - 23 mg/dL Final   Creat  Date Value Ref Range Status  05/28/2015 1.07 (H) 0.50 - 0.99 mg/dL Final  05/21/2015 1.09 (H) 0.50 - 0.99 mg/dL Final  03/11/2015 1.07 (H) 0.50 - 0.99 mg/dL Final  02/14/2015 1.04 (H) 0.50 - 0.99 mg/dL Final   Creatinine, Ser  Date  Value Ref Range Status  01/30/2019 1.64 (H) 0.44 - 1.00 mg/dL Final  01/29/2019 1.73 (H) 0.44 - 1.00 mg/dL Final  01/22/2019 1.84 (H) 0.44 - 1.00 mg/dL Final  01/19/2019 1.34 (H) 0.44 - 1.00 mg/dL Final     EKG EKG Interpretation  Date/Time:  Tuesday January 30 2019 16:42:06 EST Ventricular Rate:  63 PR Interval:  132 QRS Duration: 140 QT Interval:  526 QTC Calculation: 538 R Axis:   138 Text Interpretation: Normal sinus rhythm Right axis deviation Left ventricular hypertrophy with QRS widening and repolarization abnormality ( Cornell product , Romhilt-Estes ) Abnormal ECG Interpretation limited secondary to artifact Confirmed by Ripley Fraise [phone]) on 01/31/2019 6:35:35 AM   Radiology DG Chest 2 View  Result Date: 01/30/2019 CLINICAL DATA:  Chest pain EXAM: CHEST - 2 VIEW COMPARISON:  01/16/2019 FINDINGS: No new consolidation or edema. No pleural effusion or pneumothorax. Mild cardiomegaly with evidence of prior cardiac surgery and coronary stenting. No acute osseous abnormality. Cholecystectomy clips. IMPRESSION: No acute process in the chest.  Mild cardiomegaly. Electronically Signed   By: Macy Mis M.D.   On: 01/30/2019 16:46    Procedures Procedures (including critical care time)  Medications Ordered in ED Medications  sodium chloride flush (NS) 0.9 % injection 3 mL (has no administration in time range)    ED Course  I have reviewed the triage vital signs and the nursing notes.  Pertinent labs & imaging results that were available during my care of the patient were reviewed by me and considered in my medical decision making (see chart for details).    MDM Rules/Calculators/A&P                       Patient presents following concern for bradycardia noted by home health nurse. Patient is nontoxic appearing, afebrile, not tachypneic, not hypotensive, maintains excellent SPO2 on room air, and is in no apparent distress.  No instances of bradycardia noted since ED arrival.  She has had no worsening of her baseline symptoms. She has appropriate assessment and follow-up already scheduled. Return precautions discussed.  Patient voices understanding of these instructions, accepts the plan, and is comfortable with discharge.   Findings and plan of care discussed with Shirlyn Goltz, MD. Dr. Darl Householder personally evaluated and examined this patient.   Vitals:   01/31/19 0636 01/31/19 0636 01/31/19 0645 01/31/19 0715  BP:   (!) 168/55 (!) 145/62  Pulse: 73   80  Resp: 18  16 17   Temp:      TempSrc:      SpO2: 100%   96%  Weight:  103.6 kg    Height:  5\' 2"  (1.575 m)       Final Clinical Impression(s) / ED Diagnoses Final diagnoses:  Chest pressure    Rx / DC Orders ED Discharge Orders    None       Layla Maw 01/31/19 1601    Lorayne Bender, PA-C 01/31/19 0932    Drenda Freeze, MD 02/01/19 [phone]

## 2019-01-31 NOTE — Discharge Instructions (Signed)
Your work-up today was reassuring.  Follow-up with cardiology, as planned.  Return to the emergency department for any worsening symptoms.

## 2019-02-01 ENCOUNTER — Telehealth (HOSPITAL_COMMUNITY): Payer: Self-pay

## 2019-02-01 NOTE — Telephone Encounter (Signed)
Spoke with the patient, instructions given. She stated that she would be here for her test. Asked to call back with any questions. S.Ermelinda Eckert EMTP 

## 2019-02-06 ENCOUNTER — Other Ambulatory Visit: Payer: Self-pay

## 2019-02-06 ENCOUNTER — Ambulatory Visit (HOSPITAL_COMMUNITY): Payer: Medicare Other | Attending: Internal Medicine

## 2019-02-06 DIAGNOSIS — R079 Chest pain, unspecified: Secondary | ICD-10-CM | POA: Insufficient documentation

## 2019-02-06 LAB — MYOCARDIAL PERFUSION IMAGING
LV dias vol: 186 mL (ref 46–106)
LV sys vol: 81 mL
Peak HR: 67 {beats}/min
Rest HR: 63 {beats}/min
SRS: 4
SSS: 8
TID: 1

## 2019-02-06 MED ORDER — TECHNETIUM TC 99M TETROFOSMIN IV KIT
32.8000 | PACK | Freq: Once | INTRAVENOUS | Status: DC | PRN
  Filled 2019-02-06: qty 33

## 2019-02-06 MED ORDER — REGADENOSON 0.4 MG/5ML IV SOLN
0.4000 mg | Freq: Once | INTRAVENOUS | Status: AC
  Administered 2019-02-06: 0.4 mg via INTRAVENOUS

## 2019-02-06 MED ORDER — TECHNETIUM TC 99M TETROFOSMIN IV KIT
10.6000 | PACK | Freq: Once | INTRAVENOUS | Status: AC | PRN
  Administered 2019-02-06: 10.6 via INTRAVENOUS
  Filled 2019-02-06: qty 11

## 2019-02-06 MED ORDER — TECHNETIUM TC 99M TETROFOSMIN IV KIT
32.1000 | PACK | Freq: Once | INTRAVENOUS | Status: DC | PRN
  Filled 2019-02-06: qty 33

## 2019-02-06 MED ORDER — TECHNETIUM TC 99M TETROFOSMIN IV KIT
31.3000 | PACK | Freq: Once | INTRAVENOUS | Status: AC | PRN
  Administered 2019-02-06: 31.3 via INTRAVENOUS
  Filled 2019-02-06: qty 32

## 2019-02-14 ENCOUNTER — Other Ambulatory Visit (HOSPITAL_COMMUNITY): Payer: Self-pay

## 2019-02-14 MED ORDER — SPIRONOLACTONE 25 MG PO TABS: 12.5000 mg | ORAL_TABLET | Freq: Every day | ORAL | 3 refills | Status: DC

## 2019-02-19 ENCOUNTER — Ambulatory Visit (HOSPITAL_COMMUNITY)
Admission: RE | Admit: 2019-02-19 | Discharge: 2019-02-19 | Disposition: A | Discharge status: Home / Self Care | Payer: Medicare Other | Source: Ambulatory Visit | Attending: Cardiology | Admitting: Cardiology

## 2019-02-19 ENCOUNTER — Encounter (HOSPITAL_COMMUNITY): Payer: Self-pay

## 2019-02-19 ENCOUNTER — Other Ambulatory Visit: Payer: Self-pay

## 2019-02-19 VITALS — BP 146/78 | HR 67 | Wt 225.2 lb

## 2019-02-19 DIAGNOSIS — N189 Chronic kidney disease, unspecified: Secondary | ICD-10-CM | POA: Insufficient documentation

## 2019-02-19 DIAGNOSIS — Z888 Allergy status to other drugs, medicaments and biological substances status: Secondary | ICD-10-CM | POA: Insufficient documentation

## 2019-02-19 DIAGNOSIS — Z955 Presence of coronary angioplasty implant and graft: Secondary | ICD-10-CM | POA: Insufficient documentation

## 2019-02-19 DIAGNOSIS — R001 Bradycardia, unspecified: Secondary | ICD-10-CM | POA: Insufficient documentation

## 2019-02-19 DIAGNOSIS — Z79899 Other long term (current) drug therapy: Secondary | ICD-10-CM | POA: Diagnosis not present

## 2019-02-19 DIAGNOSIS — Z8349 Family history of other endocrine, nutritional and metabolic diseases: Secondary | ICD-10-CM | POA: Insufficient documentation

## 2019-02-19 DIAGNOSIS — Z794 Long term (current) use of insulin: Secondary | ICD-10-CM | POA: Insufficient documentation

## 2019-02-19 DIAGNOSIS — Z6841 Body Mass Index (BMI) 40.0 and over, adult: Secondary | ICD-10-CM | POA: Diagnosis not present

## 2019-02-19 DIAGNOSIS — I5022 Chronic systolic (congestive) heart failure: Secondary | ICD-10-CM | POA: Insufficient documentation

## 2019-02-19 DIAGNOSIS — E1122 Type 2 diabetes mellitus with diabetic chronic kidney disease: Secondary | ICD-10-CM | POA: Insufficient documentation

## 2019-02-19 DIAGNOSIS — Z880 Allergy status to penicillin: Secondary | ICD-10-CM | POA: Diagnosis not present

## 2019-02-19 DIAGNOSIS — I2510 Atherosclerotic heart disease of native coronary artery without angina pectoris: Secondary | ICD-10-CM | POA: Insufficient documentation

## 2019-02-19 DIAGNOSIS — Z8673 Personal history of transient ischemic attack (TIA), and cerebral infarction without residual deficits: Secondary | ICD-10-CM | POA: Insufficient documentation

## 2019-02-19 DIAGNOSIS — I480 Paroxysmal atrial fibrillation: Secondary | ICD-10-CM | POA: Diagnosis not present

## 2019-02-19 DIAGNOSIS — Z8249 Family history of ischemic heart disease and other diseases of the circulatory system: Secondary | ICD-10-CM | POA: Diagnosis not present

## 2019-02-19 DIAGNOSIS — I130 Hypertensive heart and chronic kidney disease with heart failure and stage 1 through stage 4 chronic kidney disease, or unspecified chronic kidney disease: Secondary | ICD-10-CM | POA: Insufficient documentation

## 2019-02-19 DIAGNOSIS — E662 Morbid (severe) obesity with alveolar hypoventilation: Secondary | ICD-10-CM | POA: Insufficient documentation

## 2019-02-19 DIAGNOSIS — Z7901 Long term (current) use of anticoagulants: Secondary | ICD-10-CM | POA: Diagnosis not present

## 2019-02-19 DIAGNOSIS — Z7902 Long term (current) use of antithrombotics/antiplatelets: Secondary | ICD-10-CM | POA: Insufficient documentation

## 2019-02-19 DIAGNOSIS — E785 Hyperlipidemia, unspecified: Secondary | ICD-10-CM | POA: Diagnosis not present

## 2019-02-19 DIAGNOSIS — Z85820 Personal history of malignant melanoma of skin: Secondary | ICD-10-CM | POA: Insufficient documentation

## 2019-02-19 DIAGNOSIS — F419 Anxiety disorder, unspecified: Secondary | ICD-10-CM | POA: Insufficient documentation

## 2019-02-19 DIAGNOSIS — Z881 Allergy status to other antibiotic agents status: Secondary | ICD-10-CM | POA: Insufficient documentation

## 2019-02-19 DIAGNOSIS — Z7951 Long term (current) use of inhaled steroids: Secondary | ICD-10-CM | POA: Insufficient documentation

## 2019-02-19 DIAGNOSIS — I5042 Chronic combined systolic (congestive) and diastolic (congestive) heart failure: Secondary | ICD-10-CM | POA: Diagnosis not present

## 2019-02-19 DIAGNOSIS — I080 Rheumatic disorders of both mitral and aortic valves: Secondary | ICD-10-CM | POA: Diagnosis not present

## 2019-02-19 DIAGNOSIS — I447 Left bundle-branch block, unspecified: Secondary | ICD-10-CM | POA: Insufficient documentation

## 2019-02-19 DIAGNOSIS — Z833 Family history of diabetes mellitus: Secondary | ICD-10-CM | POA: Diagnosis not present

## 2019-02-19 DIAGNOSIS — K219 Gastro-esophageal reflux disease without esophagitis: Secondary | ICD-10-CM | POA: Insufficient documentation

## 2019-02-19 DIAGNOSIS — K3184 Gastroparesis: Secondary | ICD-10-CM | POA: Insufficient documentation

## 2019-02-19 DIAGNOSIS — E1143 Type 2 diabetes mellitus with diabetic autonomic (poly)neuropathy: Secondary | ICD-10-CM | POA: Insufficient documentation

## 2019-02-19 DIAGNOSIS — Z951 Presence of aortocoronary bypass graft: Secondary | ICD-10-CM | POA: Insufficient documentation

## 2019-02-19 LAB — CBC
HCT: 36.5 % (ref 36.0–46.0)
Hemoglobin: 10.5 g/dL — ABNORMAL LOW (ref 12.0–15.0)
MCH: 24.4 pg — ABNORMAL LOW (ref 26.0–34.0)
MCHC: 28.8 g/dL — ABNORMAL LOW (ref 30.0–36.0)
MCV: 84.7 fL (ref 80.0–100.0)
Platelets: 202 10*3/uL (ref 150–400)
RBC: 4.31 MIL/uL (ref 3.87–5.11)
RDW: 20.1 % — ABNORMAL HIGH (ref 11.5–15.5)
WBC: 8.1 10*3/uL (ref 4.0–10.5)
nRBC: 0 % (ref 0.0–0.2)

## 2019-02-19 LAB — BASIC METABOLIC PANEL
Anion gap: 13 (ref 5–15)
BUN: 20 mg/dL (ref 8–23)
CO2: 30 mmol/L (ref 22–32)
Calcium: 9.4 mg/dL (ref 8.9–10.3)
Chloride: 97 mmol/L — ABNORMAL LOW (ref 98–111)
Creatinine, Ser: 1.45 mg/dL — ABNORMAL HIGH (ref 0.44–1.00)
GFR calc Af Amer: 42 mL/min — ABNORMAL LOW (ref >60)
GFR calc non Af Amer: 36 mL/min — ABNORMAL LOW (ref >60)
Glucose, Bld: 175 mg/dL — ABNORMAL HIGH (ref 70–99)
Potassium: 3.8 mmol/L (ref 3.5–5.1)
Sodium: 140 mmol/L (ref 135–145)

## 2019-02-19 LAB — BRAIN NATRIURETIC PEPTIDE: B Natriuretic Peptide: 181.1 pg/mL — ABNORMAL HIGH (ref 0.0–100.0)

## 2019-02-19 NOTE — Addendum Note (Signed)
Encounter addended by: Micki Riley, RN on: 02/19/2019 3:52 PM  Actions taken: Imaging Exam ended

## 2019-02-19 NOTE — Progress Notes (Signed)
Date:  02/19/2019   ID:  [name] [name], DOB 23-Sep-1945, [name] [id number]   Provider location: [facility] Type of Visit: Established patient   PCP:  Derinda Late, MD  Cardiologist:  Dr. [name]  Chief Complaint: F/u for Combined Systolic and Diastolic CHF   History of Present Illness: [name] [name] is a 74 y.o. female who has a history of CAD s/p CABG, diastolic CHF, and cerebrovascular disease with history of CVA presents for followup of CHF and diastolic CHF. She had CABG x 5 in 12/11.  Prior to the CABG she had multiple PCIs.  She had a CVA in 2/14 that presented as visual loss.  She had a left carotid stent in 2/15.   Left heart cath done in Sept. 2014. This showed patent SVG-D, LIMA-LAD, and sequential SVG-OM branches but SVG-RCA and the native RCA were both totally occluded. It was felt aht her increased symptoms coincided with occlusion of SVG-RCA.  She was started on Imdur to see if this would help with the chest pain and dyspnea.  However, she feels like Imdur causes leg cramps and does not think that she can take it.  So ranolazine 500 mg bid started and titrated this up to 1000 mg bid.  This helped some but not markedly.  Therefore, sent to  Dr. Irish Lack to address opening RCA CTO. He was able to do this in 5/15 with 4 overlapping Xience DES.  This led to resolution of exertional chest pain.    [name] [name] was started on Brilinta after PCI.  She became much more short of breath after starting on Brilinta. Per Dr Delano Metz stopped and replaced with Plavix.  Dyspnea improved off Brilinta. Given increasing exertional dyspnea and chest pain,  RHC/LHC was done in 4/17.  This showed stable CAD with no interventional target.  Left and right heart filling pressures were not significantly increased.  Medical management.   She had an MRI/MRA in May 2018 that showed moderately severe stenosis of the supraclinoid segment of the right internal carotid artery,  progressed since the 2016 MR angiogram and severe basilar artery stenosis.   She developed recurrent exertional chest pain and had LHC in 6/18, showing 4/5 grafts patent with patent native RCA (similar to prior cath, no changes).    Echo 1/19 with EF 55-60%, moderate diastolic dysfunction, PASP 50 mmHg, mildly dilated RV, mild AS, mild-moderate MR.   She reported increased dyspnea and chest pain and had RHC/LHC in 9/19.  She had DES to sequential SVG-OM1/PLOM.  While hospitalized, she was noted to have runs of atrial fibrillation.  She was very symptomatic with the atrial fibrillation.  Eliquis and amiodarone were started.   She was admitted 12/16-12/19/19 for cardiomems implant and RHC. She was diuresed with IV lasix and transitioned to torsemide 100 mg BID + metolazone once/week. DC weight: 295 lbs.  She had a prolonged hospitalization in 2/20 with acute respiratory failure and fever to 103.  She was thought to have PNA and treated with cefepime.  She was intubated.  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  Amiodarone was stopped and she was put on prednisone. Echo in 2/20 showed EF down to 25-30% with mid-apical LV severe hypokinesis. She had AKI. Possible ITP triggered by infection, HIT negative.  ITP was treated with Solumedrol. She was discharged to SNF.    ORIF left ankle 4/20.   11/20 admission initially with concern for  TIAs (staring spells), ended up thinking possible partial seizures.  Head MRI did not show CVA.  Echo in 11/20 showed EF 45-50%, moderately decreased RV systolic function, moderate RV enlargement, moderate-severe MR, moderate TR.   Patient was admitted again in 12/20 with AKI and hypotension in the setting of sinus bradycardia (HR 30s).  She was volume overloaded on exam.  Toprol XL was stopped and HR increased to 60s.  She was diuresed with IV Lasix.  TEE in 12/20 showed EF 50-55%, septal-lateral dyssynchrony, mildly decreased RV function,  moderate MR. She was discharged w/ plans for outpatient Zio patch for further monitoring. Results are pending. Ranexa was increased at last OV for CP, to 1000 mg bid. NST was arranged and negative for ischemia. She has cardiomems but difficulty sending transmissions due to poor WiFi access.   She presents to clinic today for f/u. Continues to endorse chronic CP. No significant improvement w/ ranexa increase.   I have downloaded Zio monitor results which demonstrate mostly NSR. Min HR was 56 bpm, max 124 and avg 79 bpm w/ occasional PVCs. Bigeminy and trigeminy seen. Overall, < 1% PVC burden.    She still notes chronic exertional dyspnea but no change from baseline. Denies resting symptoms. No LEE.  BP at home has been labile. BP ranges from low 51Z systolic to 001V. BP today in clinic 146/78. Pulse rate 67 bpm. Her weight has remained stable at 226 lb. She reports a [employer] officer will come to her home tomorrow to upgrade her Internet. Hopes to be able to send daily cardiomems transmissions.   Since her last visit, she has had worsening of her stuttering but no other neurological deficits. She plans to f/u w/ neurology in the near future. Reports compliance w/ Plavix.   ECG (personally reviewed): NSR, LBBB chronic   Labs (6/14): K 3.8, creatinine 0.71 Labs (8/14): BNP 249, LDL 166 Labs (9/14): K 4.7, creatinine 0.7 Labs (9/14): K 4.6, creatinine 0.9, BNP 109 Labs (1/15): K 4.5, creatinine 0.73, LDL 212, HCT 43.4, TSH normal, BNP 138 Labs (6/15): K 4.7, creatinine 1.17, LDL 100, HDL 37, TGs 363, BNP 39 Labs (10/15): K 4.6, creatinine 1.2 Labs (1/16): LDL 157, HDL 43, K 5.2, creatinine 0.95 Labs (2/16): K 4.5, creatinine 1.08, BNP 113 Labs (4/16): LDL 50, HDL 56, TGs 179 Labs (9/16): K 5.3, creatinine 1.09 Labs (10/16): LDL 75, HDL 56 Labs (2/17): K 5.2, creatinine 1.07 Labs (4/17): K 5.1, creatinine 1.07 Labs (5/17): K 4.5, creatinine 1.01, LDL 96, HDL 56, BNP 70 Labs (03/2016) K 4.9, creatinine  1.07 Labs (7/18): K 4.9, creatinine 1.15 Labs (11/18): K 5.1, creatinine 1.11, LDL 36 Labs (1/19): K 4.9, creatinine 1.22 Labs (9/19): LDL 108 Labs (10/19): hgb 14.5, K 4.3, creatinine 1.03 Labs 12/06/2017: K 4.7 Creatinine 1.7 Labs (3/20): K 4.8, creatinine 1.39, hgb 12.4 plts 226 Labs (11/20): K 4.2, creatinine 1.01, LDL 38 Labs (12/20): K 3.5, creatinine 1.8  PMH: 1. Diabetic gastroparesis 2. Type II diabetes 3. HTN 4. Morbid obesity 5. CAD: s/p CABG in 12/11 after prior PCIs.  LIMA-LAD, SVG-D, seq SVG-OM1 and OM2, SVG-PDA. Adenosine Cardiolite (8/14) with EF 53% and a small reversible apical defect with a medium, partially reversible inferior defect.  LHC (9/14) with patent SVG-D, LIMA-LAD (50% distal LAD), and sequential SVG-OM branches; the SVG-RCA and the native RCA were occluded.  This was managed medically initially, but with ongoing exertional chest pain, it was decided to open CTO.  Patient had CTO opening with 4  overlapping Xience DES in the RCA in 5/15.   - LHC (4/17): SVG-D patent, LIMA-LAD patent, distal LAD with several 40-50% stenoses, sequential SVG-OM1 and PLOM patent with 50% proximal stenosis (not flow limiting), SVG-RCA TO with patent RCA stents.  - LHC (6/18): 4/5 grafts patent (SVG-RCA TO, same as past), RCA stents patent => no change.  - LHC (9/19): Sequential SVG-PLOM/OM1 with 80% proximal stenosis, s/p DES.  6. Atypical migraines 7. OHS/OSA: Intolerant of CPAP. Uses oxygen with exertion and at night.  8. GERD with hiatal hernia 9. OA 10. Chronic systolic CHF: Echo (44/81) with EF 50-55%, grade II diastolic dysfunction, mild-moderate MR.  Echo (8/14) with EF 55-60%, grade II diastolic dysfunction, mildly increased aortic valve gradient (mean 12 mmHg) but valve opens well, mild MR and mild RV dilation.  Echo (9/15) with EF 60-65%, grade II diastolic dysfunction, mild aortic stenosis, mild mitral stenosis, mild to moderate mitral regurgitation, mildly dilated RV with  normal systolic function, PA systolic pressure 46 mmHg.  - RHC (4/17): mean RA 9, PA 38/15 mean 27, mean PCWP 9, CI 2.5.  - Echo (5/17): EF 55-60%, mild LVH, mildly dilated RV with low normal systolic function, moderate TR, PASP 61 mmHg - Echo (1/19): EF 55-60%, moderate diastolic dysfunction, PASP 50 mmHg, mildly dilated RV, mild AS, mild-moderate MR. - RHC (9/19): mean RA 11, PA 34/12, mean PCWP 15, CI 2.85 - Echo (3/20): EF 25-30% with mid-apical severe hypokinesis.  - Echo (11/20): EF 45-50%, moderately dilated RV with moderately decreased systolic function, moderate-severe MR, moderate TR.  - TEE (12/20): EF 50-55%, septal-lateral dyssynchrony, moderate RV dilation/mildly decreased function, peak RV-RA gradient 51 mmHg, moderate MR.  11. CKD 12. Chronic LBBB 13. Anxiety 14. Carotid stenosis: Followed by [facility], >85% LICA stenosis 63/14.  She had left carotid stent in 2/15. Carotid dopplers 8/15 with no significant disease. Carotid dopplers (4/16): < 40% RICA stenosis.  15. Cerebrovascular disease: CVA 2/14 with right posterior cerebral artery territory ischemic infarction. Cerebral angiogram in 6/14 showed 70% right vertebral ostial stenosis, 97-02% LICA stenosis, > 63% proximal left posterior cerebral artery stenosis, posterior communicating artery aneurysm.  Patient has had episodes of transient expressive aphasia.  Carotid dopplers (78/58) showed >85% LICA stenosis. She had a left carotid stent 2/15.  Possible CVA in 7/15. -  MRI/MRA in May 2018 that showed moderately severe stenosis of the supraclinoid segment of the right internal carotid artery, progressed since the 2016 MR angiogram and severe basilar artery stenosis.  16. Positional vertigo (suspected) 17. Palpitations: Holter (6/15) with rare PVCs/PACs.  - Event monitor (2/18): NSR, occasional PVCs 18. Dyspnea with Brilinta. 19. Melanoma: On face, s/p excision.   20. Aortic stenosis: Mild.  21. Holter (8/16) with no significant  arrhythmias.  22. Lower extremity arterial dopplers (9/16) were normal.  23. Sleep study (4/18): No significant OSA.  24. Atrial fibrillation: Paroxysmal - Amiodarone stopped, ?lung toxicity.  25. H/o ITP in 3/20.  16. Partial seizures 17. Mitral regurgitation: Moderate on TEE in 12/20.    Current Outpatient Medications  Medication Sig Dispense Refill  . acetaminophen (TYLENOL) 500 MG tablet Take 500 mg by mouth every 6 (six) hours as needed for mild pain, moderate pain, fever or headache.    . albuterol (VENTOLIN HFA) 108 (90 Base) MCG/ACT inhaler Inhale 2 puffs into the lungs every 4 (four) hours as needed for wheezing or shortness of breath.     . Alirocumab (PRALUENT) 150 MG/ML SOAJ Inject 150 mg/mL into the skin  every 14 (fourteen) days.    . ALPRAZolam (XANAX) 0.5 MG tablet Take 1-2 tablets (0.5-1 mg total) by mouth See admin instructions. Take one tablet (0.5 mg) by mouth every morning and two tablets (1 mg) at night, may also take 1 tablet (0.5 mg) midday as needed for anxiety    . amLODipine (NORVASC) 5 MG tablet Take 10 mg by mouth daily.    Marland Kitchen apixaban (ELIQUIS) 5 MG TABS tablet Take 1 tablet (5 mg total) by mouth 2 (two) times daily. 60 tablet 6  . bisacodyl (DULCOLAX) 5 MG EC tablet Take 1 tablet (5 mg total) by mouth daily as needed for moderate constipation. 30 tablet 1  . buPROPion (WELLBUTRIN XL) 150 MG 24 hr tablet Take 150 mg by mouth every morning.    . clopidogrel (PLAVIX) 75 MG tablet Take 75 mg by mouth at bedtime.    Marland Kitchen denosumab (PROLIA) 60 MG/ML SOSY injection Inject 60 mg into the skin every 6 (six) months.     . DULoxetine (CYMBALTA) 30 MG capsule Take 1 capsule (30 mg total) by mouth daily. 30 capsule 0  . ezetimibe (ZETIA) 10 MG tablet Take 1 tablet (10 mg total) by mouth at bedtime.    . Fluticasone-Salmeterol (ADVAIR) 250-50 MCG/DOSE AEPB Inhale 1 puff into the lungs daily.    Marland Kitchen gabapentin (NEURONTIN) 300 MG capsule Take 1-2 capsules (300-600 mg total) by mouth  See admin instructions. 383m in the mornings and 6024mat bedtime    . HYDROcodone-acetaminophen (NORCO/VICODIN) 5-325 MG tablet Take 1 tablet by mouth at bedtime as needed for moderate pain.    . Insulin Glargine (LANTUS SOLOSTAR) 100 UNIT/ML Solostar Pen Inject 38-44 Units into the skin See admin instructions. Inject 38 units subcutaneously daily at bedtime, increase to 44 units for CBG >200    . insulin lispro (HUMALOG KWIKPEN) 100 UNIT/ML KwikPen Inject 14-18 Units into the skin See admin instructions. Inject 14 units subcutaneously prior to breakfast and supper; add 4 units for CBG >200    . ipratropium-albuterol (DUONEB) 0.5-2.5 (3) MG/3ML SOLN Take 3 mLs by nebulization 2 (two) times daily as needed (asthma).    . losartan (COZAAR) 25 MG tablet Take 12.5 mg by mouth daily.    . metolazone (ZAROXOLYN) 2.5 MG tablet Take 1 tablet (2.5 mg total) by mouth daily as needed (As directed by heart failure clinic. Call heart failure office for direction when experiencing overnight weight gain).    . Multiple Vitamin (MULTIVITAMIN WITH MINERALS) TABS tablet Take 1 tablet by mouth every morning. Centrum - Women over 552  . Multiple Vitamins-Minerals (OCUVITE EYE HEALTH FORMULA) CAPS Take 1 capsule by mouth every morning.    . nitroGLYCERIN (NITROSTAT) 0.3 MG SL tablet Place 1 tablet (0.3 mg total) under the tongue every 5 (five) minutes x 3 doses as needed for chest pain. 9 tablet 1  . ondansetron (ZOFRAN-ODT) 4 MG disintegrating tablet Take 4 mg by mouth 2 (two) times daily as needed for nausea or vomiting.    . OXYGEN Inhale 2 L into the lungs at bedtime as needed (shortness of breath).     . pantoprazole (PROTONIX) 40 MG tablet Take 1 tablet (40 mg total) by mouth daily. 30 tablet 0  . Polyvinyl Alcohol-Povidone (REFRESH OP) Place 1 drop into both eyes daily as needed (redness/ dry eyes).    . potassium chloride SA (KLOR-CON) 20 MEQ tablet Take 40 mEq by mouth 2 (two) times daily.    . Pyridoxine  HCl  (VITAMIN B-6 PO) Take 1 tablet by mouth every morning.    . ranolazine (RANEXA) 1000 MG SR tablet Take 1 tablet (1,000 mg total) by mouth 2 (two) times daily. 180 tablet 1  . rosuvastatin (CRESTOR) 40 MG tablet Take 1 tablet (40 mg total) by mouth at bedtime. 30 tablet 0  . spironolactone (ALDACTONE) 25 MG tablet Take 0.5 tablets (12.5 mg total) by mouth daily. 45 tablet 3  . torsemide (DEMADEX) 20 MG tablet Take 4 tablets (80 mg) by mouth every morning, take 2 tablets (40 mg) every evening 180 tablet 2  . traZODone (DESYREL) 50 MG tablet Take 1 tablet (50 mg total) by mouth at bedtime. 30 tablet 1  . triamcinolone cream (KENALOG) 0.1 % Apply 1 application topically daily as needed (crusty spots on arms).    . vitamin B-12 (CYANOCOBALAMIN) 1000 MCG tablet Take 1,000 mcg by mouth every morning.     . zolpidem (AMBIEN) 10 MG tablet Take 0.5 tablets (5 mg total) by mouth at bedtime. 30 tablet 0   No current facility-administered medications for this encounter.    Allergies:   Amoxicillin, Brilinta [ticagrelor], Erythromycin, Flagyl [metronidazole], Penicillins, Isosorbide mononitrate [isosorbide nitrate], Jardiance [empagliflozin], Metformin and related, Tape, and Erythromycin base   Social History:  The patient  reports that she has never smoked. She has never used smokeless tobacco. She reports that she does not drink alcohol or use drugs.   Family History:  The patient's family history includes AAA (abdominal aortic aneurysm) in her father and mother; Cancer in her sister; Deep vein thrombosis in her father and mother; Diabetes in her father, paternal aunt, paternal grandmother, paternal uncle, and paternal uncle; Heart attack in her father; Heart attack (age of onset: 51) in her paternal grandfather; Heart disease in her father and paternal uncle; Hyperlipidemia in her father; Hypertension in her father, mother, and sister.   ROS:  Please see the history of present illness.   All other systems  are personally reviewed and negative.   Exam:   BP (!) 146/78   Pulse 67   Wt 102.2 kg (225 lb 3.2 oz)   SpO2 96%   BMI 41.19 kg/m  PHYSICAL EXAM: General:  Well appearing elderly WF, obese. No respiratory difficulty HEENT: normal Neck: supple. no JVD. Carotids 2+ bilat; no bruits. No lymphadenopathy or thyromegaly appreciated. Cor: PMI nondisplaced. Regular rate & rhythm. No rubs, gallops or murmurs. Lungs: clear Abdomen: soft, nontender, nondistended. No hepatosplenomegaly. No bruits or masses. Good bowel sounds. Extremities: no cyanosis, clubbing, rash, edema Neuro: alert & oriented x 3, cranial nerves grossly intact. moves all 4 extremities w/o difficulty. Affect pleasant.  Recent Labs: 12/23/2018: ALT 17 01/11/2019: B Natriuretic Peptide 473.7 01/17/2019: TSH 0.982 01/19/2019: Magnesium 1.9 01/30/2019: BUN 18; Creatinine, Ser 1.64; Potassium 4.0; Sodium 137 02/19/2019: Hemoglobin 10.5; Platelets 202  Personally reviewed   Wt Readings from Last 3 Encounters:  02/19/19 102.2 kg (225 lb 3.2 oz)  02/06/19 103.4 kg (228 lb)  01/31/19 103.6 kg (228 lb 6.4 oz)     ASSESSMENT AND PLAN:  1. CAD: Occluded native RCA and SVG-RCA. Status post opening of CTO RCA with 4 overlapping Xience DES in 5/15. DES to sequential SVG-OM1/PLOM in 9/19.  She has been on Plavix and Eliquis.  Has chronic CP but recent NST negative for ischemia. ? Small vessel disease. Unfortunately limited options. Intolerant of Imdur due to HAs. Off  blocker due to bradycardia + hypotension - Continue Ranexa 100 mg bid  -  Continuing on Plavix (neuro has wanted her to stay on this for cerebrovascular disease).  No ASA due to Eliquis.  2. Chronic systolic CHF: Echo in 4/25 with EF 55-60%, moderate diastolic dysfunction. Cardiomems placed 01/16/18.  Echo in 3/20 in setting of severe PNA with intubation showed EF 25-30%, mid-apical LV severe hypokinesis.  Possible stress (Takotsubo-type) cardiomyopathy related to  severe medical illness versus progression of CAD.  She has baseline chronic LBBB which could play a role in cardiomyopathy as well (LBBB cardiomyopathy).  Repeat echo in 11/20 with EF up to 45-50%, moderately dysfunctional RV.  TEE in 12/20 with EF 50-55% with septal-lateral dyssynchrony and mildly dysfunctional RV.  NYHA class III symptoms chronically but not volume overloaded on exam.  - She will start sending Cardiomems transmissions again once her Wifi is upgraded. Hopes to have Internet upgrade done tomorrow. She was reminded to send transmission daily.    - Continue torsemide 80 qam/40 qpm with KCl 40 qam/20 qpm.  Check BMET today.   - Continue spironolactone 12.5 mg daily.  - Continue losartan 12.5 mg daily.  - Home BPs have been labile and doubt she will tolerate increase of spiro and losartan. No changes made today. Continue current doses.  3. Hyperlipidemia: She remains on Zetia, Crestor and Praluent. Lipids at goal in 11/20.  4. Hypertension: BP controlled. Continue current regimen.  5. Cerebrovascular disease: She has history of CVA and had left carotid stent in 2/15. Followed by [facility].  Most recent MRA head showed severe stenosis of supraclinoid RICA and severe basilar artery stenosis.  - Neurology would like her to continue Plavix for this despite concomitant apixaban use.  - now with worsening of her stuttering. No other deficits. She plans to f/u with neurology in the near future.  - Check CBC today given Plavix + Eliquis.  6. OHS/OSA: Cannot tolerate CPAP.  Uses oxygen with exertion and at night. No change. 7. Atrial fibrillation: Paroxysmal, no recent palpitations. 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  She is in NSR today.  - She has completed course of prednisone for amiodarone lung toxicity.    - Continue apixaban 5 mg bid.  8. Obesity: Weight has trended down over the last year.  9. Mitral regurgitation: Moderate to severe MR on last echo in 11/20  but moderate on 12/20 TEE.  Not significant enough for Mitraclip.  10. Bradycardia: Now off Toprol XL.   -Zio patch with no significant recurrent bradycardia. Lowest HR 56 bpm.   F/u in 4-6 weeks   Signed, Lyda Jester, PA-C  02/19/2019  [place] [address] [facility] [place] [address] [phone] (office) [phone] (fax)

## 2019-02-19 NOTE — Patient Instructions (Signed)
Lab work done today. We will notify you of any abnormal lab work. No news is good news!  No medication changes today.  Please follow up with the [facility] in 4-6 weeks.  At the [facility], you and your health needs are our priority. As part of our continuing mission to provide you with exceptional heart care, we have created designated Provider Care Teams. These Care Teams include your primary Cardiologist (physician) and Advanced Practice Providers (APPs- Physician Assistants and Nurse Practitioners) who all work together to provide you with the care you need, when you need it.   You may see any of the following providers on your designated Care Team at your next follow up: Marland Kitchen Dr Glori Bickers . Dr Loralie Champagne . Darrick Grinder, NP . Lyda Jester, PA . Audry Riles, PharmD   Please be sure to bring in all your medications bottles to every appointment.

## 2019-02-20 ENCOUNTER — Telehealth (HOSPITAL_COMMUNITY): Payer: Self-pay

## 2019-02-20 MED ORDER — SPIRONOLACTONE 25 MG PO TABS: 25.0000 mg | ORAL_TABLET | Freq: Every day | ORAL | 3 refills | Status: DC

## 2019-02-20 NOTE — Telephone Encounter (Signed)
Spoke with pt to review lab work. Pt agreeable to med changes and to follow up appointment. Pt states that she is up 4 lbs from yesterday. Advised her to start with the med changes today and to follow up with up with HF clinic if not resolved/improved.

## 2019-02-20 NOTE — Telephone Encounter (Signed)
-----   Message from Consuelo Pandy, [place] sent at 02/19/2019  4:07 PM EST ----- Fluid marker is up slightly. SCr and K stable. Increase Spiro to 25 mg daily. Repeat BMP and BNP in 1 week

## 2019-02-26 ENCOUNTER — Telehealth (HOSPITAL_COMMUNITY): Payer: Self-pay

## 2019-02-26 NOTE — Telephone Encounter (Signed)
HH orders signed and faxed to [facility]. Confirmation received.

## 2019-02-28 ENCOUNTER — Other Ambulatory Visit (HOSPITAL_COMMUNITY): Payer: Self-pay

## 2019-02-28 ENCOUNTER — Other Ambulatory Visit: Payer: Self-pay

## 2019-02-28 ENCOUNTER — Other Ambulatory Visit: Payer: Self-pay | Admitting: Neurology

## 2019-02-28 ENCOUNTER — Ambulatory Visit (HOSPITAL_COMMUNITY)
Admission: RE | Admit: 2019-02-28 | Discharge: 2019-02-28 | Disposition: A | Discharge status: Home / Self Care | Payer: Medicare Other | Source: Ambulatory Visit | Attending: Internal Medicine | Admitting: Internal Medicine

## 2019-02-28 DIAGNOSIS — I5042 Chronic combined systolic (congestive) and diastolic (congestive) heart failure: Secondary | ICD-10-CM | POA: Insufficient documentation

## 2019-02-28 LAB — BASIC METABOLIC PANEL
Anion gap: 10 (ref 5–15)
BUN: 20 mg/dL (ref 8–23)
CO2: 31 mmol/L (ref 22–32)
Calcium: 9.3 mg/dL (ref 8.9–10.3)
Chloride: 97 mmol/L — ABNORMAL LOW (ref 98–111)
Creatinine, Ser: 1.28 mg/dL — ABNORMAL HIGH (ref 0.44–1.00)
GFR calc Af Amer: 49 mL/min — ABNORMAL LOW (ref >60)
GFR calc non Af Amer: 42 mL/min — ABNORMAL LOW (ref >60)
Glucose, Bld: 150 mg/dL — ABNORMAL HIGH (ref 70–99)
Potassium: 3.8 mmol/L (ref 3.5–5.1)
Sodium: 138 mmol/L (ref 135–145)

## 2019-03-12 ENCOUNTER — Telehealth (HOSPITAL_COMMUNITY): Payer: Self-pay

## 2019-03-12 NOTE — Telephone Encounter (Signed)
HH orders signed and faxed. Confirmation received.

## 2019-03-22 ENCOUNTER — Other Ambulatory Visit (HOSPITAL_COMMUNITY): Payer: Self-pay

## 2019-03-22 DIAGNOSIS — R531 Weakness: Secondary | ICD-10-CM

## 2019-03-22 NOTE — Progress Notes (Signed)
New referral placed for PT as initial referral closed without an appointment arranged.

## 2019-03-28 ENCOUNTER — Ambulatory Visit: Payer: Medicare Other | Admitting: Physical Therapy

## 2019-04-02 ENCOUNTER — Ambulatory Visit (HOSPITAL_COMMUNITY)
Admission: RE | Admit: 2019-04-02 | Discharge: 2019-04-02 | Disposition: A | Discharge status: Home / Self Care | Payer: Medicare Other | Source: Ambulatory Visit | Attending: Cardiology | Admitting: Cardiology

## 2019-04-02 ENCOUNTER — Other Ambulatory Visit: Payer: Self-pay

## 2019-04-02 ENCOUNTER — Encounter (HOSPITAL_COMMUNITY): Payer: Self-pay

## 2019-04-02 VITALS — BP 130/84 | HR 63 | Wt 225.5 lb

## 2019-04-02 DIAGNOSIS — Z888 Allergy status to other drugs, medicaments and biological substances status: Secondary | ICD-10-CM | POA: Insufficient documentation

## 2019-04-02 DIAGNOSIS — Z881 Allergy status to other antibiotic agents status: Secondary | ICD-10-CM | POA: Diagnosis not present

## 2019-04-02 DIAGNOSIS — Z7951 Long term (current) use of inhaled steroids: Secondary | ICD-10-CM | POA: Insufficient documentation

## 2019-04-02 DIAGNOSIS — Z955 Presence of coronary angioplasty implant and graft: Secondary | ICD-10-CM | POA: Insufficient documentation

## 2019-04-02 DIAGNOSIS — Z951 Presence of aortocoronary bypass graft: Secondary | ICD-10-CM | POA: Insufficient documentation

## 2019-04-02 DIAGNOSIS — Z8249 Family history of ischemic heart disease and other diseases of the circulatory system: Secondary | ICD-10-CM | POA: Insufficient documentation

## 2019-04-02 DIAGNOSIS — I447 Left bundle-branch block, unspecified: Secondary | ICD-10-CM | POA: Insufficient documentation

## 2019-04-02 DIAGNOSIS — Z6841 Body Mass Index (BMI) 40.0 and over, adult: Secondary | ICD-10-CM | POA: Insufficient documentation

## 2019-04-02 DIAGNOSIS — K219 Gastro-esophageal reflux disease without esophagitis: Secondary | ICD-10-CM | POA: Insufficient documentation

## 2019-04-02 DIAGNOSIS — E1122 Type 2 diabetes mellitus with diabetic chronic kidney disease: Secondary | ICD-10-CM | POA: Insufficient documentation

## 2019-04-02 DIAGNOSIS — Z9981 Dependence on supplemental oxygen: Secondary | ICD-10-CM | POA: Insufficient documentation

## 2019-04-02 DIAGNOSIS — Z85820 Personal history of malignant melanoma of skin: Secondary | ICD-10-CM | POA: Insufficient documentation

## 2019-04-02 DIAGNOSIS — I080 Rheumatic disorders of both mitral and aortic valves: Secondary | ICD-10-CM | POA: Diagnosis not present

## 2019-04-02 DIAGNOSIS — Z79899 Other long term (current) drug therapy: Secondary | ICD-10-CM | POA: Insufficient documentation

## 2019-04-02 DIAGNOSIS — F419 Anxiety disorder, unspecified: Secondary | ICD-10-CM | POA: Insufficient documentation

## 2019-04-02 DIAGNOSIS — E785 Hyperlipidemia, unspecified: Secondary | ICD-10-CM | POA: Diagnosis not present

## 2019-04-02 DIAGNOSIS — Z7902 Long term (current) use of antithrombotics/antiplatelets: Secondary | ICD-10-CM | POA: Insufficient documentation

## 2019-04-02 DIAGNOSIS — I2510 Atherosclerotic heart disease of native coronary artery without angina pectoris: Secondary | ICD-10-CM | POA: Diagnosis not present

## 2019-04-02 DIAGNOSIS — N189 Chronic kidney disease, unspecified: Secondary | ICD-10-CM | POA: Insufficient documentation

## 2019-04-02 DIAGNOSIS — I5022 Chronic systolic (congestive) heart failure: Secondary | ICD-10-CM | POA: Diagnosis not present

## 2019-04-02 DIAGNOSIS — I480 Paroxysmal atrial fibrillation: Secondary | ICD-10-CM | POA: Diagnosis not present

## 2019-04-02 DIAGNOSIS — R001 Bradycardia, unspecified: Secondary | ICD-10-CM | POA: Diagnosis not present

## 2019-04-02 DIAGNOSIS — Z794 Long term (current) use of insulin: Secondary | ICD-10-CM | POA: Diagnosis not present

## 2019-04-02 DIAGNOSIS — E662 Morbid (severe) obesity with alveolar hypoventilation: Secondary | ICD-10-CM | POA: Diagnosis not present

## 2019-04-02 DIAGNOSIS — Z7901 Long term (current) use of anticoagulants: Secondary | ICD-10-CM | POA: Diagnosis not present

## 2019-04-02 DIAGNOSIS — Z880 Allergy status to penicillin: Secondary | ICD-10-CM | POA: Insufficient documentation

## 2019-04-02 DIAGNOSIS — E1143 Type 2 diabetes mellitus with diabetic autonomic (poly)neuropathy: Secondary | ICD-10-CM | POA: Insufficient documentation

## 2019-04-02 DIAGNOSIS — I69323 Fluency disorder following cerebral infarction: Secondary | ICD-10-CM | POA: Diagnosis not present

## 2019-04-02 DIAGNOSIS — I130 Hypertensive heart and chronic kidney disease with heart failure and stage 1 through stage 4 chronic kidney disease, or unspecified chronic kidney disease: Secondary | ICD-10-CM | POA: Diagnosis not present

## 2019-04-02 DIAGNOSIS — Z8349 Family history of other endocrine, nutritional and metabolic diseases: Secondary | ICD-10-CM | POA: Insufficient documentation

## 2019-04-02 DIAGNOSIS — Z833 Family history of diabetes mellitus: Secondary | ICD-10-CM | POA: Insufficient documentation

## 2019-04-02 LAB — BASIC METABOLIC PANEL
Anion gap: 11 (ref 5–15)
BUN: 26 mg/dL — ABNORMAL HIGH (ref 8–23)
CO2: 30 mmol/L (ref 22–32)
Calcium: 9.2 mg/dL (ref 8.9–10.3)
Chloride: 96 mmol/L — ABNORMAL LOW (ref 98–111)
Creatinine, Ser: 1.55 mg/dL — ABNORMAL HIGH (ref 0.44–1.00)
GFR calc Af Amer: 39 mL/min — ABNORMAL LOW (ref >60)
GFR calc non Af Amer: 33 mL/min — ABNORMAL LOW (ref >60)
Glucose, Bld: 81 mg/dL (ref 70–99)
Potassium: 4.3 mmol/L (ref 3.5–5.1)
Sodium: 137 mmol/L (ref 135–145)

## 2019-04-02 LAB — CBC
HCT: 34.8 % — ABNORMAL LOW (ref 36.0–46.0)
Hemoglobin: 10.5 g/dL — ABNORMAL LOW (ref 12.0–15.0)
MCH: 25.2 pg — ABNORMAL LOW (ref 26.0–34.0)
MCHC: 30.2 g/dL (ref 30.0–36.0)
MCV: 83.5 fL (ref 80.0–100.0)
Platelets: 206 10*3/uL (ref 150–400)
RBC: 4.17 MIL/uL (ref 3.87–5.11)
RDW: 19.6 % — ABNORMAL HIGH (ref 11.5–15.5)
WBC: 8.9 10*3/uL (ref 4.0–10.5)
nRBC: 0 % (ref 0.0–0.2)

## 2019-04-02 NOTE — Patient Instructions (Addendum)
Routine lab work today. Will notify you of abnormal results  Follow up for bp check and repeat labs in 7-10 days

## 2019-04-02 NOTE — Progress Notes (Signed)
Date:  04/02/2019   ID:  [name] [name], DOB [birth year], [name] [id number]   Provider location: [facility] Type of Visit: Established patient   PCP:  Derinda Late, MD  Cardiologist:  Dr. [name]  Chief Complaint: F/u for Combined Systolic and Diastolic CHF   History of Present Illness: [name] [name] is a 74 y.o. female who has a history of CAD s/p CABG, diastolic CHF, and cerebrovascular disease with history of CVA presents for followup of CHF and diastolic CHF. She had CABG x 5 in 12/11.  Prior to the CABG she had multiple PCIs.  She had a CVA in 2/14 that presented as visual loss.  She had a left carotid stent in 2/15.   Left heart cath done in Sept. 2014. This showed patent SVG-D, LIMA-LAD, and sequential SVG-OM branches but SVG-RCA and the native RCA were both totally occluded. It was felt aht her increased symptoms coincided with occlusion of SVG-RCA.  She was started on Imdur to see if this would help with the chest pain and dyspnea.  However, she feels like Imdur causes leg cramps and does not think that she can take it.  So ranolazine 500 mg bid started and titrated this up to 1000 mg bid.  This helped some but not markedly.  Therefore, sent to  Dr. Irish Lack to address opening RCA CTO. He was able to do this in 5/15 with 4 overlapping Xience DES.  This led to resolution of exertional chest pain.    [name] [name] was started on Brilinta after PCI.  She became much more short of breath after starting on Brilinta. Per Dr Delano Metz stopped and replaced with Plavix.  Dyspnea improved off Brilinta. Given increasing exertional dyspnea and chest pain,  RHC/LHC was done in 4/17.  This showed stable CAD with no interventional target.  Left and right heart filling pressures were not significantly increased.  Medical management.   She had an MRI/MRA in May 2018 that showed moderately severe stenosis of the supraclinoid segment of the right internal carotid artery,  progressed since the 2016 MR angiogram and severe basilar artery stenosis.   She developed recurrent exertional chest pain and had LHC in 6/18, showing 4/5 grafts patent with patent native RCA (similar to prior cath, no changes).    Echo 1/19 with EF 55-60%, moderate diastolic dysfunction, PASP 50 mmHg, mildly dilated RV, mild AS, mild-moderate MR.   She reported increased dyspnea and chest pain and had RHC/LHC in 9/19.  She had DES to sequential SVG-OM1/PLOM.  While hospitalized, she was noted to have runs of atrial fibrillation.  She was very symptomatic with the atrial fibrillation.  Eliquis and amiodarone were started.   She was admitted 12/16-12/19/19 for cardiomems implant and RHC. She was diuresed with IV lasix and transitioned to torsemide 100 mg BID + metolazone once/week. DC weight: 295 lbs.  She had a prolonged hospitalization in 2/20 with acute respiratory failure and fever to 103.  She was thought to have PNA and treated with cefepime.  She was intubated.  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  Amiodarone was stopped and she was put on prednisone. Echo in 2/20 showed EF down to 25-30% with mid-apical LV severe hypokinesis. She had AKI. Possible ITP triggered by infection, HIT negative.  ITP was treated with Solumedrol. She was discharged to SNF.    ORIF left ankle 4/20.   11/20 admission initially with concern for  TIAs (staring spells), ended up thinking possible partial seizures.  Head MRI did not show CVA.  Echo in 11/20 showed EF 45-50%, moderately decreased RV systolic function, moderate RV enlargement, moderate-severe MR, moderate TR.   Patient was admitted again in 12/20 with AKI and hypotension in the setting of sinus bradycardia (HR 30s).  She was volume overloaded on exam.  Toprol XL was stopped and HR increased to 60s.  She was diuresed with IV Lasix.  TEE in 12/20 showed EF 50-55%, septal-lateral dyssynchrony, mildly decreased RV function,  moderate MR. She was discharged w/ plans for outpatient Zio which has been completed and showed no worrisome arrhythmias. Ranexa was increased for CP, to 1000 mg bid. NST was arranged and negative for ischemia. She has cardiomems but difficulty sending transmissions due to poor WiFi access.    She presents back to clinic for f/u for her HF. Feels that she is retaining fluid. Abdomen feels bigger. More swollen. Also notes mild LEE and recent dry cough. No fever or chills. She still gets SOB walking around her house (chronic). No resting dyspnea. Complains of orthopnea, sleeps w/ head of bed elevated (also chronic). Notes some fatigue. Having more issues w/ constipation. Her BP has also been running higher at home, in the 332R-518A systolic. Denies any recurrent hypotension and no orthostatic symptoms. She has been checking wt daily at home. Wt has been stable between 220-225 lb. She is not fully compliant w/ cardiomems. Has been having issues w/ her WiFi and having difficulties getting device to work and unable to send daily transmissions. Last transmission sent was 4/16 and her diastolic PA pressure was at goal of < 29 at 26 mmHg.    ____________________________ [name] Patch 12/20: Mostly NSR. Min HR was 56 bpm, max 124 and avg 79 bpm w/ occasional PVCs. Bigeminy and trigeminy seen. Overall, < 1% PVC burden.    Labs (6/14): K 3.8, creatinine 0.71 Labs (8/14): BNP 249, LDL 166 Labs (9/14): K 4.7, creatinine 0.7 Labs (9/14): K 4.6, creatinine 0.9, BNP 109 Labs (1/15): K 4.5, creatinine 0.73, LDL 212, HCT 43.4, TSH normal, BNP 138 Labs (6/15): K 4.7, creatinine 1.17, LDL 100, HDL 37, TGs 363, BNP 39 Labs (10/15): K 4.6, creatinine 1.2 Labs (1/16): LDL 157, HDL 43, K 5.2, creatinine 0.95 Labs (2/16): K 4.5, creatinine 1.08, BNP 113 Labs (4/16): LDL 50, HDL 56, TGs 179 Labs (9/16): K 5.3, creatinine 1.09 Labs (10/16): LDL 75, HDL 56 Labs (2/17): K 5.2, creatinine 1.07 Labs (4/17): K 5.1, creatinine  1.07 Labs (5/17): K 4.5, creatinine 1.01, LDL 96, HDL 56, BNP 70 Labs (03/2016) K 4.9, creatinine 1.07 Labs (7/18): K 4.9, creatinine 1.15 Labs (11/18): K 5.1, creatinine 1.11, LDL 36 Labs (1/19): K 4.9, creatinine 1.22 Labs (9/19): LDL 108 Labs (10/19): hgb 14.5, K 4.3, creatinine 1.03 Labs 12/06/2017: K 4.7 Creatinine 1.7 Labs (3/20): K 4.8, creatinine 1.39, hgb 12.4 plts 226 Labs (11/20): K 4.2, creatinine 1.01, LDL 38 Labs (12/20): K 3.5, creatinine 1.8  PMH: 1. Diabetic gastroparesis 2. Type II diabetes 3. HTN 4. Morbid obesity 5. CAD: s/p CABG in 12/11 after prior PCIs.  LIMA-LAD, SVG-D, seq SVG-OM1 and OM2, SVG-PDA. Adenosine Cardiolite (8/14) with EF 53% and a small reversible apical defect with a medium, partially reversible inferior defect.  LHC (9/14) with patent SVG-D, LIMA-LAD (50% distal LAD), and sequential SVG-OM branches; the SVG-RCA and the native RCA were occluded.  This was managed medically initially, but with ongoing exertional chest pain, it  was decided to open CTO.  Patient had CTO opening with 4 overlapping Xience DES in the RCA in 5/15.   - LHC (4/17): SVG-D patent, LIMA-LAD patent, distal LAD with several 40-50% stenoses, sequential SVG-OM1 and PLOM patent with 50% proximal stenosis (not flow limiting), SVG-RCA TO with patent RCA stents.  - LHC (6/18): 4/5 grafts patent (SVG-RCA TO, same as past), RCA stents patent => no change.  - LHC (9/19): Sequential SVG-PLOM/OM1 with 80% proximal stenosis, s/p DES.  6. Atypical migraines 7. OHS/OSA: Intolerant of CPAP. Uses oxygen with exertion and at night.  8. GERD with hiatal hernia 9. OA 10. Chronic systolic CHF: Echo (39/76) with EF 50-55%, grade II diastolic dysfunction, mild-moderate MR.  Echo (8/14) with EF 55-60%, grade II diastolic dysfunction, mildly increased aortic valve gradient (mean 12 mmHg) but valve opens well, mild MR and mild RV dilation.  Echo (9/15) with EF 60-65%, grade II diastolic dysfunction, mild  aortic stenosis, mild mitral stenosis, mild to moderate mitral regurgitation, mildly dilated RV with normal systolic function, PA systolic pressure 46 mmHg.  - RHC (4/17): mean RA 9, PA 38/15 mean 27, mean PCWP 9, CI 2.5.  - Echo (5/17): EF 55-60%, mild LVH, mildly dilated RV with low normal systolic function, moderate TR, PASP 61 mmHg - Echo (1/19): EF 55-60%, moderate diastolic dysfunction, PASP 50 mmHg, mildly dilated RV, mild AS, mild-moderate MR. - RHC (9/19): mean RA 11, PA 34/12, mean PCWP 15, CI 2.85 - Echo (3/20): EF 25-30% with mid-apical severe hypokinesis.  - Echo (11/20): EF 45-50%, moderately dilated RV with moderately decreased systolic function, moderate-severe MR, moderate TR.  - TEE (12/20): EF 50-55%, septal-lateral dyssynchrony, moderate RV dilation/mildly decreased function, peak RV-RA gradient 51 mmHg, moderate MR.  11. CKD 12. Chronic LBBB 13. Anxiety 14. Carotid stenosis: Followed by [facility], >73% LICA stenosis 41/93.  She had left carotid stent in 2/15. Carotid dopplers 8/15 with no significant disease. Carotid dopplers (4/16): < 40% RICA stenosis.  15. Cerebrovascular disease: CVA 2/14 with right posterior cerebral artery territory ischemic infarction. Cerebral angiogram in 6/14 showed 70% right vertebral ostial stenosis, 79-02% LICA stenosis, > 40% proximal left posterior cerebral artery stenosis, posterior communicating artery aneurysm.  Patient has had episodes of transient expressive aphasia.  Carotid dopplers (97/35) showed >32% LICA stenosis. She had a left carotid stent 2/15.  Possible CVA in 7/15. -  MRI/MRA in May 2018 that showed moderately severe stenosis of the supraclinoid segment of the right internal carotid artery, progressed since the 2016 MR angiogram and severe basilar artery stenosis.  16. Positional vertigo (suspected) 17. Palpitations: Holter (6/15) with rare PVCs/PACs.  - Event monitor (2/18): NSR, occasional PVCs 18. Dyspnea with Brilinta. 19. Melanoma:  On face, s/p excision.   20. Aortic stenosis: Mild.  21. Holter (8/16) with no significant arrhythmias.  22. Lower extremity arterial dopplers (9/16) were normal.  23. Sleep study (4/18): No significant OSA.  24. Atrial fibrillation: Paroxysmal - Amiodarone stopped, ?lung toxicity.  25. H/o ITP in 3/20.  16. Partial seizures 17. Mitral regurgitation: Moderate on TEE in 12/20.    Current Outpatient Medications  Medication Sig Dispense Refill  . acetaminophen (TYLENOL) 500 MG tablet Take 500 mg by mouth every 6 (six) hours as needed for mild pain, moderate pain, fever or headache.    . albuterol (VENTOLIN HFA) 108 (90 Base) MCG/ACT inhaler Inhale 2 puffs into the lungs every 4 (four) hours as needed for wheezing or shortness of breath.     Marland Kitchen  Alirocumab (PRALUENT) 150 MG/ML SOAJ Inject 150 mg/mL into the skin every 14 (fourteen) days.    . ALPRAZolam (XANAX) 0.5 MG tablet Take 1-2 tablets (0.5-1 mg total) by mouth See admin instructions. Take one tablet (0.5 mg) by mouth every morning and two tablets (1 mg) at night, may also take 1 tablet (0.5 mg) midday as needed for anxiety    . amLODipine (NORVASC) 5 MG tablet Take 10 mg by mouth daily.    Marland Kitchen apixaban (ELIQUIS) 5 MG TABS tablet Take 1 tablet (5 mg total) by mouth 2 (two) times daily. 60 tablet 6  . bisacodyl (DULCOLAX) 5 MG EC tablet Take 1 tablet (5 mg total) by mouth daily as needed for moderate constipation. 30 tablet 1  . buPROPion (WELLBUTRIN XL) 150 MG 24 hr tablet Take 150 mg by mouth every morning.    . clopidogrel (PLAVIX) 75 MG tablet Take 75 mg by mouth at bedtime.    Marland Kitchen denosumab (PROLIA) 60 MG/ML SOSY injection Inject 60 mg into the skin every 6 (six) months.     . DULoxetine (CYMBALTA) 30 MG capsule Take 1 capsule (30 mg total) by mouth daily. 30 capsule 0  . Fluticasone-Salmeterol (ADVAIR) 250-50 MCG/DOSE AEPB Inhale 1 puff into the lungs daily.    Marland Kitchen gabapentin (NEURONTIN) 300 MG capsule Take 1-2 capsules (300-600 mg total) by  mouth See admin instructions. 3103m in the mornings and 6046mat bedtime    . HYDROcodone-acetaminophen (NORCO/VICODIN) 5-325 MG tablet Take 1 tablet by mouth at bedtime as needed for moderate pain.    . Insulin Glargine (LANTUS SOLOSTAR) 100 UNIT/ML Solostar Pen Inject 38-44 Units into the skin See admin instructions. Inject 38 units subcutaneously daily at bedtime, increase to 44 units for CBG >200    . insulin lispro (HUMALOG KWIKPEN) 100 UNIT/ML KwikPen Inject 14-18 Units into the skin See admin instructions. Inject 14 units subcutaneously prior to breakfast and supper; add 4 units for CBG >200    . ipratropium-albuterol (DUONEB) 0.5-2.5 (3) MG/3ML SOLN Take 3 mLs by nebulization 2 (two) times daily as needed (asthma).    . losartan (COZAAR) 25 MG tablet Take 12.5 mg by mouth daily.    . metolazone (ZAROXOLYN) 2.5 MG tablet Take 1 tablet (2.5 mg total) by mouth daily as needed (As directed by heart failure clinic. Call heart failure office for direction when experiencing overnight weight gain).    . Multiple Vitamin (MULTIVITAMIN WITH MINERALS) TABS tablet Take 1 tablet by mouth every morning. Centrum - Women over 5565  . Multiple Vitamins-Minerals (OCUVITE EYE HEALTH FORMULA) CAPS Take 1 capsule by mouth every morning.    . nitroGLYCERIN (NITROSTAT) 0.3 MG SL tablet Place 1 tablet (0.3 mg total) under the tongue every 5 (five) minutes x 3 doses as needed for chest pain. 9 tablet 1  . ondansetron (ZOFRAN-ODT) 4 MG disintegrating tablet Take 4 mg by mouth 2 (two) times daily as needed for nausea or vomiting.    . OXYGEN Inhale 2 L into the lungs at bedtime as needed (shortness of breath).     . pantoprazole (PROTONIX) 40 MG tablet Take 1 tablet (40 mg total) by mouth daily. 30 tablet 0  . Polyvinyl Alcohol-Povidone (REFRESH OP) Place 1 drop into both eyes daily as needed (redness/ dry eyes).    . potassium chloride SA (KLOR-CON) 20 MEQ tablet Take 40 mEq by mouth 2 (two) times daily.    .  Pyridoxine HCl (VITAMIN B-6 PO) Take 1 tablet  by mouth every morning.    . ranolazine (RANEXA) 1000 MG SR tablet Take 1 tablet (1,000 mg total) by mouth 2 (two) times daily. 180 tablet 1  . rosuvastatin (CRESTOR) 40 MG tablet Take 1 tablet (40 mg total) by mouth at bedtime. 30 tablet 0  . spironolactone (ALDACTONE) 25 MG tablet Take 1 tablet (25 mg total) by mouth daily. 45 tablet 3  . torsemide (DEMADEX) 20 MG tablet Take 4 tablets (80 mg) by mouth every morning, take 2 tablets (40 mg) every evening 180 tablet 2  . traZODone (DESYREL) 50 MG tablet TAKE 1 TABLET BY MOUTH AT BEDTIME 30 tablet 1  . triamcinolone cream (KENALOG) 0.1 % Apply 1 application topically daily as needed (crusty spots on arms).    . vitamin B-12 (CYANOCOBALAMIN) 1000 MCG tablet Take 1,000 mcg by mouth every morning.     . zolpidem (AMBIEN) 10 MG tablet Take 0.5 tablets (5 mg total) by mouth at bedtime. 30 tablet 0  . ezetimibe (ZETIA) 10 MG tablet Take 1 tablet (10 mg total) by mouth at bedtime.     No current facility-administered medications for this encounter.    Allergies:   Amoxicillin, Brilinta [ticagrelor], Erythromycin, Flagyl [metronidazole], Penicillins, Isosorbide mononitrate [isosorbide nitrate], Jardiance [empagliflozin], Metformin and related, Tape, and Erythromycin base   Social History:  The patient  reports that she has never smoked. She has never used smokeless tobacco. She reports that she does not drink alcohol or use drugs.   Family History:  The patient's family history includes AAA (abdominal aortic aneurysm) in her father and mother; Cancer in her sister; Deep vein thrombosis in her father and mother; Diabetes in her father, paternal aunt, paternal grandmother, paternal uncle, and paternal uncle; Heart attack in her father; Heart attack (age of onset: 75) in her paternal grandfather; Heart disease in her father and paternal uncle; Hyperlipidemia in her father; Hypertension in her father, mother, and  sister.   ROS:  Please see the history of present illness.   All other systems are personally reviewed and negative.   Exam:   BP 130/84   Pulse 63   Wt 102.3 kg (225 lb 8 oz)   SpO2 98%   BMI 41.24 kg/m  General:  Well appearing, moderately obese WF. No respiratory difficulty HEENT: mild stutter but otherwise normal Neck: supple. no JVD. Carotids 2+ bilat; no bruits. No lymphadenopathy or thyromegaly appreciated. Cor: PMI nondisplaced. Regular rate & rhythm. 2/6 MR murmur at apex Lungs: clear Abdomen: obese, soft, nontender, nondistended. No hepatosplenomegaly. No bruits or masses. Good bowel sounds. Extremities: no cyanosis, clubbing, rash, edema Neuro: alert & oriented x 3, cranial nerves grossly intact. moves all 4 extremities w/o difficulty. Affect pleasant.   Recent Labs: 12/23/2018: ALT 17 01/17/2019: TSH 0.982 01/19/2019: Magnesium 1.9 02/19/2019: B Natriuretic Peptide 181.1; Hemoglobin 10.5; Platelets 202 02/28/2019: BUN 20; Creatinine, Ser 1.28; Potassium 3.8; Sodium 138  Personally reviewed   Wt Readings from Last 3 Encounters:  04/02/19 102.3 kg (225 lb 8 oz)  02/19/19 102.2 kg (225 lb 3.2 oz)  02/06/19 103.4 kg (228 lb)     ASSESSMENT AND PLAN:  1. CAD: Occluded native RCA and SVG-RCA. Status post opening of CTO RCA with 4 overlapping Xience DES in 5/15. DES to sequential SVG-OM1/PLOM in 9/19.  She has been on Plavix and Eliquis.  Has chronic CP but recent NST negative for ischemia. ? Small vessel disease. Unfortunately limited options. Intolerant of Imdur due to HAs. Off  blocker due  to bradycardia + hypotension - Continue Ranexa 100 mg bid  - Continuing on Plavix (neuro has wanted her to stay on this for cerebrovascular disease).  No ASA due to Eliquis.   - Continue Crestor + Praluent for HLD 2. Chronic systolic CHF: Echo in 1/70 with EF 55-60%, moderate diastolic dysfunction. Cardiomems placed 01/16/18.  Echo in 3/20 in setting of severe PNA with intubation  showed EF 25-30%, mid-apical LV severe hypokinesis.  Possible stress (Takotsubo-type) cardiomyopathy related to severe medical illness versus progression of CAD.  She has baseline chronic LBBB which could play a role in cardiomyopathy as well (LBBB cardiomyopathy).  Repeat echo in 11/20 with EF up to 45-50%, moderately dysfunctional RV.  TEE in 12/20 with EF 50-55% with septal-lateral dyssynchrony and mildly dysfunctional RV.  NYHA class III symptoms chronically but not significantly volume overloaded on exam. Her home wts have been stable ~220-225 lb - She will call [employer] tech support to help trouble shoot cardiomems issue.  She was reminded to send transmission daily.    - Continue torsemide 80 qam/40 qpm with KCl 40 qam/20 qpm.   - Increase spironolactone to 25 mg daily   - Continue losartan 12.5 mg daily.  - Check BMP today and again in 7 days   3. Hyperlipidemia: She remains on Zetia, Crestor and Praluent. Lipids at goal in 11/20.  4. Hypertension: BP has been mildly elevated at home (checks daily), SBPs in the 140s-150s - Increase Spiro to 25 mg daily.  - Plan to titrate losartan at next visit, if needed, pending renal function  5. Cerebrovascular disease: She has history of CVA and had left carotid stent in 2/15. Followed by [facility].  Most recent MRA head showed severe stenosis of supraclinoid RICA and severe basilar artery stenosis.  - Neurology would like her to continue Plavix for this despite concomitant apixaban use.  - now with worsening of her stuttering. No other deficits. She plans to f/u with neurology in the near future.  - Check CBC today given Plavix + Eliquis.  6. OHS/OSA: Cannot tolerate CPAP.  Uses oxygen with exertion and at night. No change. 7. Atrial fibrillation: Paroxysmal, no recent palpitations. 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  RRR on exam today. HR well controlled.   - She has completed course of prednisone for amiodarone lung  toxicity.    - Continue apixaban 5 mg bid. Check CBC and BMP today 8. Obesity: Body mass index is 41.24 kg/m. Weight has trended down over the last year.  9. Mitral regurgitation: Moderate to severe MR on last echo in 11/20 but moderate on 12/20 TEE.  Not significant enough for Mitraclip.  10. Bradycardia: Now off Toprol XL.   -Zio patch with no significant recurrent bradycardia. Lowest HR 56 bpm. Pulse rate today 63 bpm.    F/u in 7-10 days for repeat BMP + RN visit for BP check. F/u with Dr. [name] in 8 weeks.   Signed, Lyda Jester, PA-C  04/02/2019  [facility] [address] [facility] [place] [address] [phone] (office) [phone] (fax)

## 2019-04-04 ENCOUNTER — Ambulatory Visit: Payer: Medicare Other | Attending: Cardiology | Admitting: Physical Therapy

## 2019-04-04 ENCOUNTER — Encounter: Payer: Self-pay | Admitting: Physical Therapy

## 2019-04-04 ENCOUNTER — Other Ambulatory Visit: Payer: Self-pay

## 2019-04-04 VITALS — BP 122/62 | HR 65

## 2019-04-04 DIAGNOSIS — R2681 Unsteadiness on feet: Secondary | ICD-10-CM | POA: Insufficient documentation

## 2019-04-04 DIAGNOSIS — R2689 Other abnormalities of gait and mobility: Secondary | ICD-10-CM | POA: Diagnosis present

## 2019-04-04 DIAGNOSIS — R262 Difficulty in walking, not elsewhere classified: Secondary | ICD-10-CM | POA: Insufficient documentation

## 2019-04-04 DIAGNOSIS — M6281 Muscle weakness (generalized): Secondary | ICD-10-CM | POA: Insufficient documentation

## 2019-04-04 NOTE — Therapy (Signed)
[facility] [address]  [place], [place], [address] Phone: [phone]   Fax:  [phone]  Physical Therapy Evaluation  Patient Details  Name: [name] [name] [name]: [id number] Date of Birth: 01-16-1946 Referring Provider (PT): Loralie Champagne, MD   Encounter Date: 04/04/2019  PT End of Session - 04/04/19 1539    Visit Number  1    Number of Visits  17    Date for PT Re-Evaluation  05/30/19    Authorization Type  Medicare & AARP    PT Start Time  7782    PT Stop Time  1532    PT Time Calculation (min)  41 min    Equipment Utilized During Treatment  Gait belt    Activity Tolerance  Patient tolerated treatment well;Patient limited by fatigue    Behavior During Therapy  [facility] for tasks assessed/performed       Past Medical History:  Diagnosis Date  . Anemia    hx  . Anxiety   . Asthma   . Basal cell carcinoma 05/2014   "left shoulder"  . Bundle branch block, left    chronic/notes 07/18/2013  . CHF (congestive heart failure) ([place])   . Chronic insomnia 05/06/2015  . Chronic kidney disease    frequency, sees dr Jamal Maes every 4 to 6 months (01/16/2018)  . Chronic lower back pain   . Claustrophobia   . Common migraine 05/14/2014  . Coronary artery disease    MI in 2001, 2002, 2006, 2011, 2014  . Depression   . Diabetic peripheral neuropathy ([place]) 01/12/2019  . GERD (gastroesophageal reflux disease)   . H/O hiatal hernia   . Headache    "at least 2/month" (01/16/2018)  . Heart murmur   . Hyperlipidemia   . Hypertension   . Memory change 05/14/2014  . Migraine    "5-6/year"  (01/16/2018)  . Obesity 01-2010  . Obstructive sleep apnea    "can't wear machine; I have claustrophobia" (01/16/2018), states she had a 2nd sleep study and does not have sleep apnea, her O2 decreases and now is on 2 L of O2 at night.  . On home oxygen therapy    "2L at night and prn during daytime" (01/16/2018)  . Osteoarthritis     "knees and hands" (01/16/2018)  . Peripheral vascular disease (HCC)    ? numbness, tingling arms and legs  . PONV (postoperative nausea and vomiting)   . Stroke [facility])    2014, 2015, 2016  . Type II diabetes mellitus ([place])   . Ventral hernia    hx of    Past Surgical History:  Procedure Laterality Date  . ANKLE FRACTURE SURGERY Left 1970's  . APPENDECTOMY  1970's   w/hysterectomy  . BASAL CELL CARCINOMA EXCISION Left 05/2014   "shoulder" (01/16/2018)  . CARDIAC CATHETERIZATION  10/10/2012   Dr [name].  Marland Kitchen CARDIAC CATHETERIZATION N/A 05/29/2015   Procedure: Right/Left Heart Cath and Coronary/Graft Angiography;  Surgeon: Larey Dresser, MD;  Location: [place] CV LAB;  Service: Cardiovascular;  Laterality: N/A;  . CAROTID ENDARTERECTOMY Left 03/2013  . CAROTID STENT INSERTION Left 03/20/2013   Procedure: CAROTID STENT INSERTION;  Surgeon: [name], MD;  Location: [facility] CATH LAB;  Service: Cardiovascular;  Laterality: Left;  internal carotid  . CEREBRAL ANGIOGRAM N/A 04/05/2011   Procedure: CEREBRAL ANGIOGRAM;  Surgeon: Angelia Mould, MD;  Location: [facility] CATH LAB;  Service: Cardiovascular;  Laterality: N/A;  . CHOLECYSTECTOMY  OPEN  2004  . CORONARY ANGIOPLASTY WITH STENT PLACEMENT  01,02,05,06,07,08,11; 04/24/2013   "I've probably got ~ 10 stents by now" (04/24/2013)  . CORONARY ANGIOPLASTY WITH STENT PLACEMENT  06/13/2013   "got 4 stents today" (06/13/2013)  . CORONARY ARTERY BYPASS GRAFT  1220/11   "CABG X5"  . CORONARY STENT INTERVENTION N/A 10/20/2017   Procedure: CORONARY STENT INTERVENTION;  Surgeon: Troy Sine, MD;  Location: [place] [facility] LAB;  Service: Cardiovascular;  Laterality: N/A;  . ESOPHAGOGASTRODUODENOSCOPY  08/03/2011   Procedure: ESOPHAGOGASTRODUODENOSCOPY (EGD);  Surgeon: Shann Medal, MD;  Location: [facility];  Service: General;  Laterality: N/A;  . ESOPHAGOGASTRODUODENOSCOPY (EGD) WITH PROPOFOL N/A 03/11/2014   Procedure: ESOPHAGOGASTRODUODENOSCOPY  (EGD) WITH PROPOFOL;  Surgeon: Lafayette Dragon, MD;  Location: [facility] ENDOSCOPY;  Service: Endoscopy;  Laterality: N/A;  . FRACTURE SURGERY    . gall stone removal  05/2003  . GASTRIC RESTRICTION SURGERY  1984   "stapeling"  . HERNIA REPAIR  2004   "in my stomach; had OR on it twice", wire mesh on 1 hernia  . LEFT HEART CATH AND CORS/GRAFTS ANGIOGRAPHY N/A 07/09/2016   Procedure: LEFT HEART CATH AND CORS/GRAFTS ANGIOGRAPHY;  Surgeon: Larey Dresser, MD;  Location: [place] CV LAB;  Service: Cardiovascular;  Laterality: N/A;  . ORIF ANKLE FRACTURE Left 05/16/2018  . ORIF ANKLE FRACTURE Left 05/16/2018   Procedure: OPEN REDUCTION INTERNAL FIXATION (ORIF) Left ankle with possible syndesmosis fixation;  Surgeon: Nicholes Stairs, MD;  Location: [place] [facility];  Service: Orthopedics;  Laterality: Left;  174min  . OVARY SURGERY  1970's   "tumor removed"  . PERCUTANEOUS CORONARY STENT INTERVENTION (PCI-S) N/A 06/13/2013   Procedure: PERCUTANEOUS CORONARY STENT INTERVENTION (PCI-S);  Surgeon: [name], MD;  Location: [facility] CATH LAB;  Service: Cardiovascular;  Laterality: N/A;  . PERCUTANEOUS STENT INTERVENTION N/A 04/24/2013   Procedure: PERCUTANEOUS STENT INTERVENTION;  Surgeon: [name], MD;  Location: [facility] CATH LAB;  Service: Cardiovascular;  Laterality: N/A;  . RIGHT/LEFT HEART CATH AND CORONARY/GRAFT ANGIOGRAPHY N/A 10/20/2017   Procedure: RIGHT/LEFT HEART CATH AND CORONARY/GRAFT ANGIOGRAPHY;  Surgeon: Larey Dresser, MD;  Location: [place] CV LAB;  Service: Cardiovascular;  Laterality: N/A;  . ROOT CANAL  10/2000  . TEE WITHOUT CARDIOVERSION N/A 01/18/2019   Procedure: TRANSESOPHAGEAL ECHOCARDIOGRAM (TEE);  Surgeon: Larey Dresser, MD;  Location: [facility] ENDOSCOPY;  Service: Cardiovascular;  Laterality: N/A;  . TIBIA FRACTURE SURGERY Right 1970's   rods and pins  . TOOTH EXTRACTION     "1 on the upper; wisdom tooth on the lower" (01/16/2018)  . TOTAL ABDOMINAL HYSTERECTOMY  1970's    w/ appendectomy    Vitals:   04/04/19 1454  BP: 122/62  Pulse: 65  SpO2: 94%     Subjective Assessment - 04/04/19 1457    Subjective  Patient reports that in February 2020 she was hospitalized for pneumonia and CHF for 18 days, during which she was intubated. Released to a SNF for 8-12 weeks, then released home. Before getting into the house, she fell and broke her L ankle and was treated with ORIF and had to recover from that. Most recent stroke was in November 2020. Now dealing with fatigue and weakness, most evident on L side. Does have some pain in her LB with walking. Has had frequent falls at home as well as during her rehab progress d/t knee buckling. Walks with a 2WW at home and in the community. Owns a 4WW that is too  big to go through doors.    Pertinent History  DM II, stroke 2014, 2015, 2016, PVD, supplemental O2 use, migraine, memory change, HTN, HLD, HA, hiatal hernia, GERD, peripheral neuropathy, depression, CAD, claustrophobia, CHF, L BBB, asthma, anxiety, anemia, L ankle ORIF 05/2018, CABG x5 2011    Limitations  House hold activities;Walking;Standing;Lifting;Sitting    How long can you sit comfortably?  unlimited    How long can you stand comfortably?  5-10 min d/t fatigue    How long can you walk comfortably?  5 min d/t fatigue    Diagnostic tests  none recent    Patient Stated Goals  "get to walking again and get control of my fatigue"    Currently in Pain?  Yes    Pain Score  3     Pain Location  Back    Pain Orientation  Lower;Right;Left    Pain Descriptors / Indicators  Aching    Pain Type  Chronic pain         [facility] PT Assessment - 04/04/19 1506      Assessment   Medical Diagnosis  Generalized Weakness    Referring Provider (PT)  Loralie Champagne, MD    Onset Date/Surgical Date  03/04/18    Hand Dominance  Right    Next MD Visit  06/06/19    Prior Therapy  yes      Precautions   Precautions  Fall   frequent knee buckling and L sided weakness      Balance Screen   Has the patient fallen in the past 6 months  Yes    How many times?  8    Has the patient had a decrease in activity level because of a fear of falling?   Yes    Is the patient reluctant to leave their home because of a fear of falling?   Yes      [place]  Private residence    Living Arrangements  Spouse/significant other    Available Help at Discharge  Family    Type of [place] to enter    Entrance Stairs-Number of Steps  1    Entrance Stairs-Rails  None    Home Layout  Two level    Alternate Level Stairs-Number of Steps  17   has an Energy manager - 2 wheels;Walker - 4 wheels;Shower seat;Grab bars - tub/shower;Grab bars - toilet;Wheelchair - manual      Prior Function   Level of Independence  Independent with household mobility with device;Needs assistance with ADLs    Vocation  Retired    Leisure  walking      Cognition   Overall Cognitive Status  Within Functional Limits for tasks assessed      Sensation   Light Touch  Impaired by gross assessment   neuropathy in B feet causing N/T     Coordination   Gross Motor Movements are Fluid and Coordinated  Yes      Posture/Postural Control   Posture/Postural Control  Postural limitations    Postural Limitations  Rounded Shoulders;Forward head      ROM / Strength   AROM / PROM / Strength  Strength      Strength   Strength Assessment Site  Hip;Knee;Ankle    Right/Left Hip  Right;Left    Right Hip Flexion  4/5    Right Hip ABduction  4/5  Right Hip ADduction  4/5    Left Hip Flexion  4-/5    Left Hip ABduction  4/5    Left Hip ADduction  4/5    Right/Left Knee  Right;Left    Right Knee Flexion  4/5    Right Knee Extension  4+/5    Left Knee Flexion  3+/5    Left Knee Extension  4/5    Right/Left Ankle  Right;Left    Right Ankle Dorsiflexion  4+/5    Right Ankle Plantar Flexion  4+/5    Left Ankle Dorsiflexion  4-/5     Left Ankle Plantar Flexion  4+/5      Ambulation/Gait   Assistive device  Rolling walker    Gait Pattern  Step-to pattern;Decreased hip/knee flexion - left;Decreased stance time - left;Decreased step length - left;Decreased weight shift to left;Decreased dorsiflexion - left;Left foot flat;Left flexed knee in stance;Trunk flexed    Ambulation Surface  Level;Indoor    Gait velocity  decreased      Standardized Balance Assessment   Standardized Balance Assessment  Timed Up and Go Test;Five Times Sit to Stand    Five times sit to stand comments   28.29   pushing off from B UEs     Timed Up and Go Test   Normal TUG (seconds)  38.34   with 2WW               Objective measurements completed on examination: See above findings.              PT Education - 04/04/19 1539    Education Details  prognosis, POC, HEP; edu on safety with HEP    Person(s) Educated  Patient    Methods  Explanation;Demonstration;Tactile cues;Verbal cues;Handout    Comprehension  Verbalized understanding;Returned demonstration       PT Short Term Goals - 04/04/19 1547      PT SHORT TERM GOAL #1   Title  Patient to be independent with initial HEP.    Time  3    Period  Weeks    Status  New    Target Date  04/25/19        PT Long Term Goals - 04/04/19 1547      PT LONG TERM GOAL #1   Title  Patient to be independent with advanced HEP.    Time  8    Period  Weeks    Status  New    Target Date  05/30/19      PT LONG TERM GOAL #2   Title  Patient to demonstrate B LE strength >/=4+/5.    Time  8    Period  Weeks    Status  New    Target Date  05/30/19      PT LONG TERM GOAL #3   Title  Patient to score improve TUG score by 10 sec in order to decrease risk of falls.    Baseline  38.34 sec with RW    Time  8    Period  Weeks    Status  New    Target Date  05/16/19      PT LONG TERM GOAL #4   Title  Patient to score improve 5xSTS score by 10 sec without useing UEs in order to  decrease risk of falls.    Baseline  28.29 sec pushing off from B armrests    Time  8    Period  Weeks  Status  New    Target Date  05/30/19      PT LONG TERM GOAL #5   Title  Patient to report tolerance for 15 min of walking with LRAD without fatigue limiting.    Time  8    Period  Weeks    Status  New    Target Date  05/30/19             Plan - 04/04/19 1540    Clinical Impression Statement  Patient is a 74y/o F presenting to OPPT with c/o frequent falls and generalized weakness since hospitalization in February 2020 when she was intubated d/t CHF and pneumonia. Subsequently sustained L ankle fx after a fall, requiring ORIF. Patient has also had several strokes, with the last one being in November 2020, resulting in L sided weakness. Patient now with c/o fatigue and weakness with standing and walking. Currently ambulates with 2WW in the home and community. Patient today presenting with decreased LE strength, L>R, rounded and forward head posture, and gait deviations. Patient demonstrated increased time to complete TUG and 5xSTS, indicating increased risk of falls. Educated patient on LE strengthening HEP to be performed at counter top for safety- patient reported understanding. Would benefit from skilled PT services 2x/week for 8 weeks to address aforementioned impairments.    Personal Factors and Comorbidities  Age;Comorbidity 3+;Fitness;Past/Current Experience;Time since onset of injury/illness/exacerbation    Comorbidities  DM II, stroke 2014, 2015, 2016, PVD, supplemental O2 use, migraine, memory change, HTN, HLD, HA, hiatal hernia, GERD, peripheral neuropathy, depression, CAD, claustrophobia, CHF, L BBB, asthma, anxiety, anemia, L ankle ORIF 05/2018, CABG x5 2011    Examination-Activity Limitations  Bathing;Bend;Squat;Stairs;Carry;Stand;Toileting;Dressing;Transfers;Hygiene/Grooming;Lift;Locomotion Level;Reach Overhead    Examination-Participation Restrictions   Church;Shop;Cleaning;Community Activity;Yard Work;Interpersonal Relationship;Laundry;Meal Prep    Stability/Clinical Decision Making  Stable/Uncomplicated    Clinical Decision Making  Low    Rehab Potential  Good    PT Frequency  2x / week    PT Duration  8 weeks    PT Treatment/Interventions  ADLs/Self Care Home Management;Cryotherapy;Electrical Stimulation;Moist Heat;Balance training;Therapeutic exercise;Therapeutic activities;Functional mobility training;Stair training;Gait training;Ultrasound;Neuromuscular re-education;Patient/family education;Manual techniques;Taping;Energy conservation;Dry needling;Passive range of motion    PT Next Visit Plan  reassess HEP    Consulted and Agree with Plan of Care  Patient       Patient will benefit from skilled therapeutic intervention in order to improve the following deficits and impairments:  Abnormal gait, Decreased endurance, Cardiopulmonary status limiting activity, Decreased activity tolerance, Decreased strength, Pain, Decreased balance, Decreased mobility, Difficulty walking, Improper body mechanics, Decreased range of motion, Impaired flexibility, Postural dysfunction  Visit Diagnosis: Muscle weakness (generalized)  Unsteadiness on feet  Other abnormalities of gait and mobility  Difficulty in walking, not elsewhere classified     Problem List Patient Active Problem List   Diagnosis Date Noted  . Symptomatic bradycardia 01/16/2019  . Normocytic anemia 01/16/2019  . Diabetic peripheral neuropathy ([place]) 01/12/2019  . Neurologic abnormality 12/25/2018  . Debility 09/21/2018  . Hypotension 09/18/2018  . Bradycardia 09/18/2018  . AKI (acute kidney injury) ([place]) 09/18/2018  . Pressure injury of skin 09/18/2018  . Ischemic cerebrovascular accident (CVA) of frontal lobe ([place]) 09/13/2018  . Fall 09/09/2018  . History of CVA (cerebrovascular accident) 09/09/2018  . Type II diabetes mellitus with renal manifestations ([place]) 09/09/2018  .  CKD (chronic kidney disease), stage III 09/09/2018  . Sepsis ([place]) 09/09/2018  . Depression with anxiety 09/09/2018  . Slurred speech 09/09/2018  . Ankle fracture, left 05/16/2018  .  Amiodarone pulmonary toxicity   . Physical deconditioning   . 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 Acute on chronic congestive heart failure ([place])   . Chronic combined systolic (congestive) and diastolic (congestive) heart failure ([place]) 01/16/2018  . Chronic systolic CHF (congestive heart failure) ([place]) 01/16/2018  . Atrial fibrillation ([place]) 10/21/2017  . CAD (coronary artery disease) of bypass graft 10/20/2017  . Coronary artery disease of bypass graft of native heart with stable angina pectoris ([place])   . Chronic low back pain 08/24/2016  . Pain of left thumb 08/05/2016  . Nocturnal hypoxia 06/02/2016  . Hoarseness 04/05/2016  . Laryngopharyngeal reflux (LPR) 04/05/2016  . Pharyngoesophageal dysphagia 04/05/2016  . Angina decubitus ([place]) 05/22/2015  . Chronic insomnia 05/06/2015  . Common migraine 05/14/2014  . Abnormality of gait 05/14/2014  . Memory change 05/14/2014  . Aneurysm, cerebral, nonruptured 05/14/2014  . Cramp of limb-Left neck 05/10/2014  . Hematemesis with nausea   . Vomiting blood   . Dizziness and giddiness 09/21/2013  . Atypical chest pain 07/18/2013  . Unstable angina ([place]) 04/09/2013  . Carotid artery stenosis, symptomatic 03/20/2013  . Cerebrovascular disease 07/10/2012  . Cerebral artery occlusion with cerebral infarction ([place]) 07/10/2012  . Mitral regurgitation 04/15/2012  . TIA (transient ischemic attack) 04/15/2012  . Occlusion and stenosis of carotid artery without mention of cerebral infarction 08/18/2011  . Bariatric surgery status 06/17/2011  . Speech abnormality 03/22/2011  . Dyspnea 02/24/2011  . PAPILLARY MUSCLE DYSFUNCTION, NON-RHEUMATIC 10/09/2008  . UNSPECIFIED VITAMIN D DEFICIENCY 10/24/2007  . MYOCARDIAL INFARCTION, HX OF 10/24/2007  . PERSISTENT VOMITING  10/24/2007  . OSTEOARTHRITIS 10/24/2007  . MIGRAINES, HX OF 10/24/2007  . Diabetes mellitus type 2, controlled, with complications ([place]) 37/11/6267  . Hyperlipemia 01/16/2007  . Obesity-post failed open gastroplasty 1984  01/16/2007  . OBSTRUCTIVE SLEEP APNEA 01/16/2007  . Essential hypertension 01/16/2007  . Coronary atherosclerosis 01/16/2007  . Asthma 01/16/2007  . GERD 01/16/2007  . VENTRAL HERNIA 01/16/2007     Janene Harvey, PT, DPT 04/04/19 3:51 PM   [facility] [address]  [place], [place], [address] Phone: [phone]   Fax:  [phone]  Name: [name] [name] [name]: [id number] Date of Birth: 09-05-1945

## 2019-04-09 ENCOUNTER — Other Ambulatory Visit: Payer: Self-pay

## 2019-04-09 ENCOUNTER — Ambulatory Visit: Payer: Medicare Other

## 2019-04-09 VITALS — BP 110/68 | HR 66

## 2019-04-09 DIAGNOSIS — R262 Difficulty in walking, not elsewhere classified: Secondary | ICD-10-CM

## 2019-04-09 DIAGNOSIS — R2689 Other abnormalities of gait and mobility: Secondary | ICD-10-CM

## 2019-04-09 DIAGNOSIS — M6281 Muscle weakness (generalized): Secondary | ICD-10-CM

## 2019-04-09 DIAGNOSIS — R2681 Unsteadiness on feet: Secondary | ICD-10-CM

## 2019-04-09 NOTE — Therapy (Signed)
[facility] [address]  [place], [place], [address] Phone: [phone]   Fax:  [phone]  Physical Therapy Treatment  Patient Details  Name: [name] [name] [name]: [id number] Date of Birth: 1945/08/12 Referring Provider (PT): Loralie Champagne, MD   Encounter Date: 04/09/2019  PT End of Session - 04/09/19 1504    Visit Number  2    Number of Visits  17    Date for PT Re-Evaluation  05/30/19    Authorization Type  Medicare & AARP    PT Start Time  1287    PT Stop Time  1532    PT Time Calculation (min)  38 min    Equipment Utilized During Treatment  Gait belt    Activity Tolerance  Patient tolerated treatment well;Patient limited by fatigue    Behavior During Therapy  [facility] for tasks assessed/performed       Past Medical History:  Diagnosis Date  . Anemia    hx  . Anxiety   . Asthma   . Basal cell carcinoma 05/2014   "left shoulder"  . Bundle branch block, left    chronic/notes 07/18/2013  . CHF (congestive heart failure) ([place])   . Chronic insomnia 05/06/2015  . Chronic kidney disease    frequency, sees dr Jamal Maes every 4 to 6 months (01/16/2018)  . Chronic lower back pain   . Claustrophobia   . Common migraine 05/14/2014  . Coronary artery disease    MI in 2001, 2002, 2006, 2011, 2014  . Depression   . Diabetic peripheral neuropathy ([place]) 01/12/2019  . GERD (gastroesophageal reflux disease)   . H/O hiatal hernia   . Headache    "at least 2/month" (01/16/2018)  . Heart murmur   . Hyperlipidemia   . Hypertension   . Memory change 05/14/2014  . Migraine    "5-6/year"  (01/16/2018)  . Obesity 01-2010  . Obstructive sleep apnea    "can't wear machine; I have claustrophobia" (01/16/2018), states she had a 2nd sleep study and does not have sleep apnea, her O2 decreases and now is on 2 L of O2 at night.  . On home oxygen therapy    "2L at night and prn during daytime" (01/16/2018)  . Osteoarthritis     "knees and hands" (01/16/2018)  . Peripheral vascular disease (HCC)    ? numbness, tingling arms and legs  . PONV (postoperative nausea and vomiting)   . Stroke [facility])    2014, 2015, 2016  . Type II diabetes mellitus ([place])   . Ventral hernia    hx of    Past Surgical History:  Procedure Laterality Date  . ANKLE FRACTURE SURGERY Left 1970's  . APPENDECTOMY  1970's   w/hysterectomy  . BASAL CELL CARCINOMA EXCISION Left 05/2014   "shoulder" (01/16/2018)  . CARDIAC CATHETERIZATION  10/10/2012   Dr [name].  Marland Kitchen CARDIAC CATHETERIZATION N/A 05/29/2015   Procedure: Right/Left Heart Cath and Coronary/Graft Angiography;  Surgeon: Larey Dresser, MD;  Location: [place] CV LAB;  Service: Cardiovascular;  Laterality: N/A;  . CAROTID ENDARTERECTOMY Left 03/2013  . CAROTID STENT INSERTION Left 03/20/2013   Procedure: CAROTID STENT INSERTION;  Surgeon: [name], MD;  Location[facility] CATH LAB;  Service: Cardiovascular;  Laterality: Left;  internal carotid  . CEREBRAL ANGIOGRAM N/A 04/05/2011   Procedure: CEREBRAL ANGIOGRAM;  Surgeon: Angelia Mould, MD;  Location: [facility] CATH LAB;  Service: Cardiovascular;  Laterality: N/A;  . CHOLECYSTECTOMY  OPEN  2004  . CORONARY ANGIOPLASTY WITH STENT PLACEMENT  01,02,05,06,07,08,11; 04/24/2013   "I've probably got ~ 10 stents by now" (04/24/2013)  . CORONARY ANGIOPLASTY WITH STENT PLACEMENT  06/13/2013   "got 4 stents today" (06/13/2013)  . CORONARY ARTERY BYPASS GRAFT  1220/11   "CABG X5"  . CORONARY STENT INTERVENTION N/A 10/20/2017   Procedure: CORONARY STENT INTERVENTION;  Surgeon: Troy Sine, MD;  Location: [place] CV [facility];  Service: Cardiovascular;  Laterality: N/A;  . ESOPHAGOGASTRODUODENOSCOPY  08/03/2011   Procedure: ESOPHAGOGASTRODUODENOSCOPY (EGD);  Surgeon: Shann Medal, MD;  Location: [facility];  Service: General;  Laterality: N/A;  . ESOPHAGOGASTRODUODENOSCOPY (EGD) WITH PROPOFOL N/A 03/11/2014   Procedure: ESOPHAGOGASTRODUODENOSCOPY  (EGD) WITH PROPOFOL;  Surgeon: Lafayette Dragon, MD;  Location: [facility] ENDOSCOPY;  Service: Endoscopy;  Laterality: N/A;  . FRACTURE SURGERY    . gall stone removal  05/2003  . GASTRIC RESTRICTION SURGERY  1984   "stapeling"  . HERNIA REPAIR  2004   "in my stomach; had OR on it twice", wire mesh on 1 hernia  . LEFT HEART CATH AND CORS/GRAFTS ANGIOGRAPHY N/A 07/09/2016   Procedure: LEFT HEART CATH AND CORS/GRAFTS ANGIOGRAPHY;  Surgeon: Larey Dresser, MD;  Location: [place] [facility];  Service: Cardiovascular;  Laterality: N/A;  . ORIF ANKLE FRACTURE Left 05/16/2018  . ORIF ANKLE FRACTURE Left 05/16/2018   Procedure: OPEN REDUCTION INTERNAL FIXATION (ORIF) Left ankle with possible syndesmosis fixation;  Surgeon: Nicholes Stairs, MD;  Location: [facility] [place];  Service: Orthopedics;  Laterality: Left;  120min  . OVARY SURGERY  1970's   "tumor removed"  . PERCUTANEOUS CORONARY STENT INTERVENTION (PCI-S) N/A 06/13/2013   Procedure: PERCUTANEOUS CORONARY STENT INTERVENTION (PCI-S);  Surgeon: [name], MD;  Location: [facility] CATH LAB;  Service: Cardiovascular;  Laterality: N/A;  . PERCUTANEOUS STENT INTERVENTION N/A 04/24/2013   Procedure: PERCUTANEOUS STENT INTERVENTION;  Surgeon: [name], MD;  Location: [facility] CATH LAB;  Service: Cardiovascular;  Laterality: N/A;  . RIGHT/LEFT HEART CATH AND CORONARY/GRAFT ANGIOGRAPHY N/A 10/20/2017   Procedure: RIGHT/LEFT HEART CATH AND CORONARY/GRAFT ANGIOGRAPHY;  Surgeon: Larey Dresser, MD;  Location: [place] [facility];  Service: Cardiovascular;  Laterality: N/A;  . ROOT CANAL  10/2000  . TEE WITHOUT CARDIOVERSION N/A 01/18/2019   Procedure: TRANSESOPHAGEAL ECHOCARDIOGRAM (TEE);  Surgeon: Larey Dresser, MD;  Location: [facility] ENDOSCOPY;  Service: Cardiovascular;  Laterality: N/A;  . TIBIA FRACTURE SURGERY Right 1970's   rods and pins  . TOOTH EXTRACTION     "1 on the upper; wisdom tooth on the lower" (01/16/2018)  . TOTAL ABDOMINAL HYSTERECTOMY  1970's    w/ appendectomy    Vitals:   04/09/19 1503  BP: 110/68  Pulse: 66  SpO2: 97%    Subjective Assessment - 04/09/19 1503    Subjective  Pt. reporting she had some L leg "buckling" yesterday while walking with RW however this did not result in a fall.    Pertinent History  DM II, stroke 2014, 2015, 2016, PVD, supplemental O2 use, migraine, memory change, HTN, HLD, HA, hiatal hernia, GERD, peripheral neuropathy, depression, CAD, claustrophobia, CHF, L BBB, asthma, anxiety, anemia, L ankle ORIF 05/2018, CABG x5 2011    Diagnostic tests  none recent    Patient Stated Goals  "get to walking again and get control of my fatigue"    Currently in Pain?  Yes    Pain Score  5     Pain Location  Back  Pain Orientation  Lower;Right;Left    Pain Descriptors / Indicators  Aching    Pain Type  Chronic pain                       [facility] Adult PT Treatment/Exercise - 04/09/19 0001      Ambulation/Gait   Ambulation/Gait  Yes    Ambulation/Gait Assistance  5: Supervision    Ambulation/Gait Assistance Details  cues required for pt. to maintain B LE in RW with step-to/step-through pattern for upright posture and improved UE stability in case of LE "buckling"     Ambulation Distance (Feet)  20 Feet    Assistive device  Rolling walker    Gait Pattern  Step-to pattern;Decreased hip/knee flexion - left;Decreased stance time - left;Decreased step length - left;Decreased weight shift to left;Decreased dorsiflexion - left;Left foot flat;Left flexed knee in stance;Trunk flexed    Ambulation Surface  Level;Indoor      Knee/Hip Exercises: Aerobic   Nustep  Lvl 2, 5 min (LE/UE)      Knee/Hip Exercises: Standing   Heel Raises  Both;10 reps;2 sets    Heel Raises Limitations  Heel/toe raise holding chair     Knee Flexion  Right;Left;10 reps;Strengthening    Knee Flexion Limitations  in RW    Hip Flexion  Right;Left;10 reps;Knee bent;Stengthening;2 sets    Hip Flexion Limitations   in RW        Knee/Hip Exercises: Seated   Long Arc Quad  Right;Left;10 reps;Strengthening    Long Arc Quad Limitations  focusing on TKE      Knee/Hip Exercises: Supine   Bridges  2 sets;5 reps;Strengthening               PT Short Term Goals - 04/09/19 1504      PT SHORT TERM GOAL #1   Title  Patient to be independent with initial HEP.    Time  3    Period  Weeks    Status  On-going    Target Date  04/25/19        PT Long Term Goals - 04/09/19 1504      PT LONG TERM GOAL #1   Title  Patient to be independent with advanced HEP.    Time  8    Period  Weeks    Status  On-going      PT LONG TERM GOAL #2   Title  Patient to demonstrate B LE strength >/=4+/5.    Time  8    Period  Weeks    Status  On-going      PT LONG TERM GOAL #3   Title  Patient to score improve TUG score by 10 sec in order to decrease risk of falls.    Baseline  38.34 sec with RW    Time  8    Period  Weeks    Status  On-going      PT LONG TERM GOAL #4   Title  Patient to score improve 5xSTS score by 10 sec without useing UEs in order to decrease risk of falls.    Baseline  28.29 sec pushing off from B armrests    Time  8    Period  Weeks    Status  On-going      PT LONG TERM GOAL #5   Title  Patient to report tolerance for 15 min of walking with LRAD without fatigue limiting.    Time  8  Period  Weeks    Status  On-going            Plan - 04/09/19 1505    Clinical Impression Statement  [name] [name] doing well today.  Vitals remained WFL in session today.  Pt. did report some LE "buckling" yesterday which did not result in fall.  Also reporting her BP was "190 over something" yesterday and encouraged pt. to contact MD if this happens again.  BP WFL today in session.  Tolerated all LE strengthening and gait training for improved RW safety well today.  Ended session with pt. verbalizing LE fatigue.    Comorbidities  DM II, stroke 2014, 2015, 2016, PVD, supplemental O2 use, migraine, memory change,  HTN, HLD, HA, hiatal hernia, GERD, peripheral neuropathy, depression, CAD, claustrophobia, CHF, L BBB, asthma, anxiety, anemia, L ankle ORIF 05/2018, CABG x5 2011    Rehab Potential  Good    PT Treatment/Interventions  ADLs/Self Care Home Management;Cryotherapy;Electrical Stimulation;Moist Heat;Balance training;Therapeutic exercise;Therapeutic activities;Functional mobility training;Stair training;Gait training;Ultrasound;Neuromuscular re-education;Patient/family education;Manual techniques;Taping;Energy conservation;Dry needling;Passive range of motion    PT Next Visit Plan  LE strengthening; gait training for improved RW safety    Consulted and Agree with Plan of Care  Patient       Patient will benefit from skilled therapeutic intervention in order to improve the following deficits and impairments:  Abnormal gait, Decreased endurance, Cardiopulmonary status limiting activity, Decreased activity tolerance, Decreased strength, Pain, Decreased balance, Decreased mobility, Difficulty walking, Improper body mechanics, Decreased range of motion, Impaired flexibility, Postural dysfunction  Visit Diagnosis: Muscle weakness (generalized)  Unsteadiness on feet  Other abnormalities of gait and mobility  Difficulty in walking, not elsewhere classified     Problem List Patient Active Problem List   Diagnosis Date Noted  . Symptomatic bradycardia 01/16/2019  . Normocytic anemia 01/16/2019  . Diabetic peripheral neuropathy ([place]) 01/12/2019  . Neurologic abnormality 12/25/2018  . Debility 09/21/2018  . Hypotension 09/18/2018  . Bradycardia 09/18/2018  . AKI (acute kidney injury) ([place]) 09/18/2018  . Pressure injury of skin 09/18/2018  . Ischemic cerebrovascular accident (CVA) of frontal lobe ([place]) 09/13/2018  . Fall 09/09/2018  . History of CVA (cerebrovascular accident) 09/09/2018  . Type II diabetes mellitus with renal manifestations ([place]) 09/09/2018  . CKD (chronic kidney disease), stage  III 09/09/2018  . Sepsis ([place]) 09/09/2018  . Depression with anxiety 09/09/2018  . Slurred speech 09/09/2018  . Ankle fracture, left 05/16/2018  . Amiodarone pulmonary toxicity   . Physical deconditioning   . 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 Acute on chronic congestive heart failure ([place])   . Chronic combined systolic (congestive) and diastolic (congestive) heart failure ([place]) 01/16/2018  . Chronic systolic CHF (congestive heart failure) ([place]) 01/16/2018  . Atrial fibrillation ([place]) 10/21/2017  . CAD (coronary artery disease) of bypass graft 10/20/2017  . Coronary artery disease of bypass graft of native heart with stable angina pectoris ([place])   . Chronic low back pain 08/24/2016  . Pain of left thumb 08/05/2016  . Nocturnal hypoxia 06/02/2016  . Hoarseness 04/05/2016  . Laryngopharyngeal reflux (LPR) 04/05/2016  . Pharyngoesophageal dysphagia 04/05/2016  . Angina decubitus ([place]) 05/22/2015  . Chronic insomnia 05/06/2015  . Common migraine 05/14/2014  . Abnormality of gait 05/14/2014  . Memory change 05/14/2014  . Aneurysm, cerebral, nonruptured 05/14/2014  . Cramp of limb-Left neck 05/10/2014  . Hematemesis with nausea   . Vomiting blood   . Dizziness and giddiness 09/21/2013  . Atypical chest pain 07/18/2013  .  Unstable angina ([place]) 04/09/2013  . Carotid artery stenosis, symptomatic 03/20/2013  . Cerebrovascular disease 07/10/2012  . Cerebral artery occlusion with cerebral infarction ([place]) 07/10/2012  . Mitral regurgitation 04/15/2012  . TIA (transient ischemic attack) 04/15/2012  . Occlusion and stenosis of carotid artery without mention of cerebral infarction 08/18/2011  . Bariatric surgery status 06/17/2011  . Speech abnormality 03/22/2011  . Dyspnea 02/24/2011  . PAPILLARY MUSCLE DYSFUNCTION, NON-RHEUMATIC 10/09/2008  . UNSPECIFIED VITAMIN D DEFICIENCY 10/24/2007  . MYOCARDIAL INFARCTION, HX OF 10/24/2007  . PERSISTENT VOMITING 10/24/2007  . OSTEOARTHRITIS 10/24/2007   . MIGRAINES, HX OF 10/24/2007  . Diabetes mellitus type 2, controlled, with complications ([place]) 68/12/5724  . Hyperlipemia 01/16/2007  . Obesity-post failed open gastroplasty 1984  01/16/2007  . OBSTRUCTIVE SLEEP APNEA 01/16/2007  . Essential hypertension 01/16/2007  . Coronary atherosclerosis 01/16/2007  . Asthma 01/16/2007  . GERD 01/16/2007  . VENTRAL HERNIA 01/16/2007    Bess Harvest, PTA 04/09/19 4:58 PM   [facility] [address]  [place], [place], [address] Phone: [phone]   Fax:  [phone]  Name: [name] [name] [name]: [id number] Date of Birth: 1945/02/14

## 2019-04-11 ENCOUNTER — Encounter (HOSPITAL_COMMUNITY): Payer: Medicare Other

## 2019-04-12 ENCOUNTER — Other Ambulatory Visit: Payer: Self-pay

## 2019-04-12 ENCOUNTER — Ambulatory Visit: Payer: Medicare Other

## 2019-04-12 VITALS — BP 154/78 | HR 72

## 2019-04-12 DIAGNOSIS — R262 Difficulty in walking, not elsewhere classified: Secondary | ICD-10-CM

## 2019-04-12 DIAGNOSIS — M6281 Muscle weakness (generalized): Secondary | ICD-10-CM | POA: Diagnosis not present

## 2019-04-12 DIAGNOSIS — R2689 Other abnormalities of gait and mobility: Secondary | ICD-10-CM

## 2019-04-12 DIAGNOSIS — R2681 Unsteadiness on feet: Secondary | ICD-10-CM

## 2019-04-12 NOTE — Therapy (Signed)
[facility] [address]  [place], [place], [address] Phone: [phone]   Fax:  [phone]  Physical Therapy Treatment  Patient Details  Name: [name] [name] [name]: [id number] Date of Birth: 20-Nov-1945 Referring Provider (PT): Loralie Champagne, MD   Encounter Date: 04/12/2019  PT End of Session - 04/12/19 1424    Visit Number  3    Number of Visits  17    Date for PT Re-Evaluation  05/30/19    Authorization Type  Medicare & AARP    PT Start Time  1401    PT Stop Time  1440    PT Time Calculation (min)  39 min    Equipment Utilized During Treatment  Gait belt    Activity Tolerance  Patient tolerated treatment well;Patient limited by fatigue    Behavior During Therapy  [facility] for tasks assessed/performed       Past Medical History:  Diagnosis Date  . Anemia    hx  . Anxiety   . Asthma   . Basal cell carcinoma 05/2014   "left shoulder"  . Bundle branch block, left    chronic/notes 07/18/2013  . CHF (congestive heart failure) ([place])   . Chronic insomnia 05/06/2015  . Chronic kidney disease    frequency, sees dr Jamal Maes every 4 to 6 months (01/16/2018)  . Chronic lower back pain   . Claustrophobia   . Common migraine 05/14/2014  . Coronary artery disease    MI in 2001, 2002, 2006, 2011, 2014  . Depression   . Diabetic peripheral neuropathy ([place]) 01/12/2019  . GERD (gastroesophageal reflux disease)   . H/O hiatal hernia   . Headache    "at least 2/month" (01/16/2018)  . Heart murmur   . Hyperlipidemia   . Hypertension   . Memory change 05/14/2014  . Migraine    "5-6/year"  (01/16/2018)  . Obesity 01-2010  . Obstructive sleep apnea    "can't wear machine; I have claustrophobia" (01/16/2018), states she had a 2nd sleep study and does not have sleep apnea, her O2 decreases and now is on 2 L of O2 at night.  . On home oxygen therapy    "2L at night and prn during daytime" (01/16/2018)  . Osteoarthritis     "knees and hands" (01/16/2018)  . Peripheral vascular disease (HCC)    ? numbness, tingling arms and legs  . PONV (postoperative nausea and vomiting)   . Stroke [facility])    2014, 2015, 2016  . Type II diabetes mellitus ([place])   . Ventral hernia    hx of    Past Surgical History:  Procedure Laterality Date  . ANKLE FRACTURE SURGERY Left 1970's  . APPENDECTOMY  1970's   w/hysterectomy  . BASAL CELL CARCINOMA EXCISION Left 05/2014   "shoulder" (01/16/2018)  . CARDIAC CATHETERIZATION  10/10/2012   Dr [name].  Marland Kitchen CARDIAC CATHETERIZATION N/A 05/29/2015   Procedure: Right/Left Heart Cath and Coronary/Graft Angiography;  Surgeon: Larey Dresser, MD;  Location: [place] CV LAB;  Service: Cardiovascular;  Laterality: N/A;  . CAROTID ENDARTERECTOMY Left 03/2013  . CAROTID STENT INSERTION Left 03/20/2013   Procedure: CAROTID STENT INSERTION;  Surgeon: [name], MD;  Location: [facility] CATH LAB;  Service: Cardiovascular;  Laterality: Left;  internal carotid  . CEREBRAL ANGIOGRAM N/A 04/05/2011   Procedure: CEREBRAL ANGIOGRAM;  Surgeon: Angelia Mould, MD;  Location: [facility] CATH LAB;  Service: Cardiovascular;  Laterality: N/A;  . CHOLECYSTECTOMY  OPEN  2004  . CORONARY ANGIOPLASTY WITH STENT PLACEMENT  01,02,05,06,07,08,11; 04/24/2013   "I've probably got ~ 10 stents by now" (04/24/2013)  . CORONARY ANGIOPLASTY WITH STENT PLACEMENT  06/13/2013   "got 4 stents today" (06/13/2013)  . CORONARY ARTERY BYPASS GRAFT  1220/11   "CABG X5"  . CORONARY STENT INTERVENTION N/A 10/20/2017   Procedure: CORONARY STENT INTERVENTION;  Surgeon: [place] Sine, MD;  Location: [facility];  Service: Cardiovascular;  Laterality: N/A;  . ESOPHAGOGASTRODUODENOSCOPY  08/03/2011   Procedure: ESOPHAGOGASTRODUODENOSCOPY (EGD);  Surgeon: Shann Medal, MD;  Location: [facility];  Service: General;  Laterality: N/A;  . ESOPHAGOGASTRODUODENOSCOPY (EGD) WITH PROPOFOL N/A 03/11/2014   Procedure: ESOPHAGOGASTRODUODENOSCOPY  (EGD) WITH PROPOFOL;  Surgeon: Lafayette Dragon, MD;  Location: [facility] ENDOSCOPY;  Service: Endoscopy;  Laterality: N/A;  . FRACTURE SURGERY    . gall stone removal  05/2003  . GASTRIC RESTRICTION SURGERY  1984   "stapeling"  . HERNIA REPAIR  2004   "in my stomach; had OR on it twice", wire mesh on 1 hernia  . LEFT HEART CATH AND CORS/GRAFTS ANGIOGRAPHY N/A 07/09/2016   Procedure: LEFT HEART CATH AND CORS/GRAFTS ANGIOGRAPHY;  Surgeon: Larey Dresser, MD;  Location: [place] [facility];  Service: Cardiovascular;  Laterality: N/A;  . ORIF ANKLE FRACTURE Left 05/16/2018  . ORIF ANKLE FRACTURE Left 05/16/2018   Procedure: OPEN REDUCTION INTERNAL FIXATION (ORIF) Left ankle with possible syndesmosis fixation;  Surgeon: Nicholes Stairs, MD;  Location: [facility];  Service: Orthopedics;  Laterality: Left;  153min  . OVARY SURGERY  1970's   "tumor removed"  . PERCUTANEOUS CORONARY STENT INTERVENTION (PCI-S) N/A 06/13/2013   Procedure: PERCUTANEOUS CORONARY STENT INTERVENTION (PCI-S);  Surgeon: [name], MD;  Location: [facility] CATH LAB;  Service: Cardiovascular;  Laterality: N/A;  . PERCUTANEOUS STENT INTERVENTION N/A 04/24/2013   Procedure: PERCUTANEOUS STENT INTERVENTION;  Surgeon: [name], MD;  Location: [facility] CATH LAB;  Service: Cardiovascular;  Laterality: N/A;  . RIGHT/LEFT HEART CATH AND CORONARY/GRAFT ANGIOGRAPHY N/A 10/20/2017   Procedure: RIGHT/LEFT HEART CATH AND CORONARY/GRAFT ANGIOGRAPHY;  Surgeon: Larey Dresser, MD;  Location: [place] CV LAB;  Service: Cardiovascular;  Laterality: N/A;  . ROOT CANAL  10/2000  . TEE WITHOUT CARDIOVERSION N/A 01/18/2019   Procedure: TRANSESOPHAGEAL ECHOCARDIOGRAM (TEE);  Surgeon: Larey Dresser, MD;  Location: [facility] ENDOSCOPY;  Service: Cardiovascular;  Laterality: N/A;  . TIBIA FRACTURE SURGERY Right 1970's   rods and pins  . TOOTH EXTRACTION     "1 on the upper; wisdom tooth on the lower" (01/16/2018)  . TOTAL ABDOMINAL HYSTERECTOMY  1970's    w/ appendectomy    Vitals:   04/12/19 1423 04/12/19 1435  BP: (!) 150/80 (!) 154/78  Pulse: 74 72  SpO2: 94% 97%    Subjective Assessment - 04/12/19 1423    Subjective  Was tired after last session however reports she got to HEP that night and was not sore.    Pertinent History  DM II, stroke 2014, 2015, 2016, PVD, supplemental O2 use, migraine, memory change, HTN, HLD, HA, hiatal hernia, GERD, peripheral neuropathy, depression, CAD, claustrophobia, CHF, L BBB, asthma, anxiety, anemia, L ankle ORIF 05/2018, CABG x5 2011    Diagnostic tests  none recent    Patient Stated Goals  "get to walking again and get control of my fatigue"    Currently in Pain?  Yes    Pain Score  7     Pain Location  Back    Pain Orientation  Lower;Right;Left    Pain Descriptors / Indicators  Aching    Pain Type  Chronic pain    Multiple Pain Sites  No                       [facility] Adult PT Treatment/Exercise - 04/12/19 0001      Transfers   Transfers  Sit to Stand    Sit to Stand  5: Supervision    Sit to Stand Details (indicate cue type and reason)  cues required to avoid "pulling up on RW"      Ambulation/Gait   Ambulation/Gait  Yes    Ambulation/Gait Assistance  5: Supervision    Ambulation/Gait Assistance Details  cues required for increased L step-length and upright posture     Ambulation Distance (Feet)  90 Feet    Assistive device  Rolling walker    Gait Pattern  Step-to pattern;Decreased hip/knee flexion - left;Decreased stance time - left;Decreased step length - left;Decreased weight shift to left;Decreased dorsiflexion - left;Left foot flat;Left flexed knee in stance;Trunk flexed    Ambulation Surface  Level;Indoor      Knee/Hip Exercises: Aerobic   Nustep  Lvl 2, 6 min (LE/UE)      Knee/Hip Exercises: Standing   Heel Raises  Both;10 reps;2 sets    Heel Raises Limitations  Heel/toe raise holding chair     Hip Flexion  Right;Left;10 reps;Knee bent;Stengthening;2 sets     Hip Flexion Limitations   in RW     Hip Abduction  Right;Left;10 reps;Knee straight;Stengthening    Abduction Limitations  standing chair support    Hip Extension  Right;Left;10 reps;Knee straight;Stengthening    Extension Limitations  in RW      Knee/Hip Exercises: Seated   Long Arc Quad  Right;Left;10 reps;Strengthening    Long Arc Quad Weight  1 lbs.    Long [employer] Limitations  focusing on TKE               PT Short Term Goals - 04/09/19 1504      PT SHORT TERM GOAL #1   Title  Patient to be independent with initial HEP.    Time  3    Period  Weeks    Status  On-going    Target Date  04/25/19        PT Long Term Goals - 04/09/19 1504      PT LONG TERM GOAL #1   Title  Patient to be independent with advanced HEP.    Time  8    Period  Weeks    Status  On-going      PT LONG TERM GOAL #2   Title  Patient to demonstrate B LE strength >/=4+/5.    Time  8    Period  Weeks    Status  On-going      PT LONG TERM GOAL #3   Title  Patient to score improve TUG score by 10 sec in order to decrease risk of falls.    Baseline  38.34 sec with RW    Time  8    Period  Weeks    Status  On-going      PT LONG TERM GOAL #4   Title  Patient to score improve 5xSTS score by 10 sec without useing UEs in order to decrease risk of falls.    Baseline  28.29 sec pushing off from B armrests  Time  8    Period  Weeks    Status  On-going      PT LONG TERM GOAL #5   Title  Patient to report tolerance for 15 min of walking with LRAD without fatigue limiting.    Time  8    Period  Weeks    Status  On-going            Plan - 04/12/19 1437    Clinical Impression Statement  Jalynn denies soreness after PT sessions however does note LE fatigue.  Reports daily adherence to HEP.  Progressed standing LE strengthening today with intermittent need for sitting rest breaks to recover from shortness of breath.  Pt. vitals remained WFL in session today however initial BP reading  somewhat elevated.  Pt. reports she monitors BP at home multiples times per day and reports frequent fluctuation in BP which MD is aware of.  Ended session with LE fatigue.     Comorbidities  DM II, stroke 2014, 2015, 2016, PVD, supplemental O2 use, migraine, memory change, HTN, HLD, HA, hiatal hernia, GERD, peripheral neuropathy, depression, CAD, claustrophobia, CHF, L BBB, asthma, anxiety, anemia, L ankle ORIF 05/2018, CABG x5 2011    Rehab Potential  Good    PT Treatment/Interventions  ADLs/Self Care Home Management;Cryotherapy;Electrical Stimulation;Moist Heat;Balance training;Therapeutic exercise;Therapeutic activities;Functional mobility training;Stair training;Gait training;Ultrasound;Neuromuscular re-education;Patient/family education;Manual techniques;Taping;Energy conservation;Dry needling;Passive range of motion    PT Next Visit Plan  LE strengthening; gait training for improved RW safety    Consulted and Agree with Plan of Care  Patient       Patient will benefit from skilled therapeutic intervention in order to improve the following deficits and impairments:  Abnormal gait, Decreased endurance, Cardiopulmonary status limiting activity, Decreased activity tolerance, Decreased strength, Pain, Decreased balance, Decreased mobility, Difficulty walking, Improper body mechanics, Decreased range of motion, Impaired flexibility, Postural dysfunction  Visit Diagnosis: Muscle weakness (generalized)  Unsteadiness on feet  Other abnormalities of gait and mobility  Difficulty in walking, not elsewhere classified     Problem List Patient Active Problem List   Diagnosis Date Noted  . Symptomatic bradycardia 01/16/2019  . Normocytic anemia 01/16/2019  . Diabetic peripheral neuropathy ([place]) 01/12/2019  . Neurologic abnormality 12/25/2018  . Debility 09/21/2018  . Hypotension 09/18/2018  . Bradycardia 09/18/2018  . AKI (acute kidney injury) ([place]) 09/18/2018  . Pressure injury of skin  09/18/2018  . Ischemic cerebrovascular accident (CVA) of frontal lobe ([place]) 09/13/2018  . Fall 09/09/2018  . History of CVA (cerebrovascular accident) 09/09/2018  . Type II diabetes mellitus with renal manifestations ([place]) 09/09/2018  . CKD (chronic kidney disease), stage III 09/09/2018  . Sepsis ([place]) 09/09/2018  . Depression with anxiety 09/09/2018  . Slurred speech 09/09/2018  . Ankle fracture, left 05/16/2018  . Amiodarone pulmonary toxicity   . Physical deconditioning   . 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 Acute on chronic congestive heart failure ([place])   . Chronic combined systolic (congestive) and diastolic (congestive) heart failure ([place]) 01/16/2018  . Chronic systolic CHF (congestive heart failure) ([place]) 01/16/2018  . Atrial fibrillation ([place]) 10/21/2017  . CAD (coronary artery disease) of bypass graft 10/20/2017  . Coronary artery disease of bypass graft of native heart with stable angina pectoris ([place])   . Chronic low back pain 08/24/2016  . Pain of left thumb 08/05/2016  . Nocturnal hypoxia 06/02/2016  . Hoarseness 04/05/2016  . Laryngopharyngeal reflux (LPR) 04/05/2016  . Pharyngoesophageal dysphagia 04/05/2016  . Angina decubitus ([place]) 05/22/2015  .  Chronic insomnia 05/06/2015  . Common migraine 05/14/2014  . Abnormality of gait 05/14/2014  . Memory change 05/14/2014  . Aneurysm, cerebral, nonruptured 05/14/2014  . Cramp of limb-Left neck 05/10/2014  . Hematemesis with nausea   . Vomiting blood   . Dizziness and giddiness 09/21/2013  . Atypical chest pain 07/18/2013  . Unstable angina ([place]) 04/09/2013  . Carotid artery stenosis, symptomatic 03/20/2013  . Cerebrovascular disease 07/10/2012  . Cerebral artery occlusion with cerebral infarction ([place]) 07/10/2012  . Mitral regurgitation 04/15/2012  . TIA (transient ischemic attack) 04/15/2012  . Occlusion and stenosis of carotid artery without mention of cerebral infarction 08/18/2011  . Bariatric surgery status  06/17/2011  . Speech abnormality 03/22/2011  . Dyspnea 02/24/2011  . PAPILLARY MUSCLE DYSFUNCTION, NON-RHEUMATIC 10/09/2008  . UNSPECIFIED VITAMIN D DEFICIENCY 10/24/2007  . MYOCARDIAL INFARCTION, HX OF 10/24/2007  . PERSISTENT VOMITING 10/24/2007  . OSTEOARTHRITIS 10/24/2007  . MIGRAINES, HX OF 10/24/2007  . Diabetes mellitus type 2, controlled, with complications ([place]) 37/10/6436  . Hyperlipemia 01/16/2007  . Obesity-post failed open gastroplasty 1984  01/16/2007  . OBSTRUCTIVE SLEEP APNEA 01/16/2007  . Essential hypertension 01/16/2007  . Coronary atherosclerosis 01/16/2007  . Asthma 01/16/2007  . GERD 01/16/2007  . VENTRAL HERNIA 01/16/2007    Bess Harvest, PTA 04/12/19 5:13 PM   [facility] [address]  [place], [place], [address] Phone: [phone]   Fax:  [phone]  Name: RYANNA TESCHNER [name]: [id number] Date of Birth: [birth year]

## 2019-04-13 ENCOUNTER — Encounter (HOSPITAL_COMMUNITY): Payer: Self-pay

## 2019-04-13 ENCOUNTER — Ambulatory Visit (HOSPITAL_COMMUNITY)
Admission: RE | Admit: 2019-04-13 | Discharge: 2019-04-13 | Disposition: A | Discharge status: Home / Self Care | Payer: Medicare Other | Source: Ambulatory Visit | Attending: Cardiology | Admitting: Cardiology

## 2019-04-13 VITALS — BP 132/82 | HR 62 | Wt 224.2 lb

## 2019-04-13 DIAGNOSIS — I5022 Chronic systolic (congestive) heart failure: Secondary | ICD-10-CM | POA: Diagnosis present

## 2019-04-13 LAB — BASIC METABOLIC PANEL
Anion gap: 10 (ref 5–15)
BUN: 24 mg/dL — ABNORMAL HIGH (ref 8–23)
CO2: 30 mmol/L (ref 22–32)
Calcium: 9 mg/dL (ref 8.9–10.3)
Chloride: 97 mmol/L — ABNORMAL LOW (ref 98–111)
Creatinine, Ser: 1.51 mg/dL — ABNORMAL HIGH (ref 0.44–1.00)
GFR calc Af Amer: 40 mL/min — ABNORMAL LOW (ref >60)
GFR calc non Af Amer: 34 mL/min — ABNORMAL LOW (ref >60)
Glucose, Bld: 134 mg/dL — ABNORMAL HIGH (ref 70–99)
Potassium: 5.2 mmol/L — ABNORMAL HIGH (ref 3.5–5.1)
Sodium: 137 mmol/L (ref 135–145)

## 2019-04-13 NOTE — Patient Instructions (Signed)
Lab work was done today. We will notify you of any abnormal lab work. No news is good news!  Your blood pressure looks great!  Please keep follow up with the [facility] in May 2021.  At the [facility], you and your health needs are our priority. As part of our continuing mission to provide you with exceptional heart care, we have created designated Provider Care Teams. These Care Teams include your primary Cardiologist (physician) and Advanced Practice Providers (APPs- Physician Assistants and Nurse Practitioners) who all work together to provide you with the care you need, when you need it.   You may see any of the following providers on your designated Care Team at your next follow up: Marland Kitchen Dr Glori Bickers . Dr Loralie Champagne . Darrick Grinder, NP . Lyda Jester, PA . Audry Riles, PharmD   Please be sure to bring in all your medications bottles to every appointment.

## 2019-04-13 NOTE — Addendum Note (Signed)
Encounter addended by: Marlise Eves, RN on: 04/13/2019 2:23 PM  Actions taken: Clinical Note Signed

## 2019-04-13 NOTE — Progress Notes (Signed)
Pt arrived for bp check/labs. Pt states that she feels better. Pt states that her pharmacy didn't have a script for her spiro. Called pharmacy and they are getting her order ready for her to pick up today.   bp 132/82 Spo2 97% RA HR  62 Wt: 224.2lbs  BMET drawn.

## 2019-04-16 ENCOUNTER — Other Ambulatory Visit: Payer: Self-pay

## 2019-04-16 ENCOUNTER — Ambulatory Visit: Payer: Medicare Other

## 2019-04-16 DIAGNOSIS — M6281 Muscle weakness (generalized): Secondary | ICD-10-CM

## 2019-04-16 DIAGNOSIS — R2681 Unsteadiness on feet: Secondary | ICD-10-CM

## 2019-04-16 DIAGNOSIS — R262 Difficulty in walking, not elsewhere classified: Secondary | ICD-10-CM

## 2019-04-16 DIAGNOSIS — R2689 Other abnormalities of gait and mobility: Secondary | ICD-10-CM

## 2019-04-16 NOTE — Therapy (Signed)
[facility] [address]  [place], [place], [address] Phone: [phone]   Fax:  [phone]  Physical Therapy Treatment  Patient Details  Name: [name] [name] [name]: [id number] Date of Birth: [birth year] Referring Provider (PT): Loralie Champagne, MD   Encounter Date: 04/16/2019  PT End of Session - 04/16/19 1413    Visit Number  4    Number of Visits  17    Date for PT Re-Evaluation  05/30/19    Authorization Type  Medicare & AARP    PT Start Time  9983    PT Stop Time  1440    PT Time Calculation (min)  44 min    Equipment Utilized During Treatment  Gait belt    Activity Tolerance  Patient tolerated treatment well;Patient limited by fatigue    Behavior During Therapy  [facility] for tasks assessed/performed       Past Medical History:  Diagnosis Date  . Anemia    hx  . Anxiety   . Asthma   . Basal cell carcinoma 05/2014   "left shoulder"  . Bundle branch block, left    chronic/notes 07/18/2013  . CHF (congestive heart failure) ([place])   . Chronic insomnia 05/06/2015  . Chronic kidney disease    frequency, sees dr Jamal Maes every 4 to 6 months (01/16/2018)  . Chronic lower back pain   . Claustrophobia   . Common migraine 05/14/2014  . Coronary artery disease    MI in 2001, 2002, 2006, 2011, 2014  . Depression   . Diabetic peripheral neuropathy ([place]) 01/12/2019  . GERD (gastroesophageal reflux disease)   . H/O hiatal hernia   . Headache    "at least 2/month" (01/16/2018)  . Heart murmur   . Hyperlipidemia   . Hypertension   . Memory change 05/14/2014  . Migraine    "5-6/year"  (01/16/2018)  . Obesity 01-2010  . Obstructive sleep apnea    "can't wear machine; I have claustrophobia" (01/16/2018), states she had a 2nd sleep study and does not have sleep apnea, her O2 decreases and now is on 2 L of O2 at night.  . On home oxygen therapy    "2L at night and prn during daytime" (01/16/2018)  . Osteoarthritis     "knees and hands" (01/16/2018)  . Peripheral vascular disease (HCC)    ? numbness, tingling arms and legs  . PONV (postoperative nausea and vomiting)   . Stroke [facility])    2014, 2015, 2016  . Type II diabetes mellitus ([place])   . Ventral hernia    hx of    Past Surgical History:  Procedure Laterality Date  . ANKLE FRACTURE SURGERY Left 1970's  . APPENDECTOMY  1970's   w/hysterectomy  . BASAL CELL CARCINOMA EXCISION Left 05/2014   "shoulder" (01/16/2018)  . CARDIAC CATHETERIZATION  10/10/2012   Dr [name].  Marland Kitchen CARDIAC CATHETERIZATION N/A 05/29/2015   Procedure: Right/Left Heart Cath and Coronary/Graft Angiography;  Surgeon: Larey Dresser, MD;  Location: [place] CV LAB;  Service: Cardiovascular;  Laterality: N/A;  . CAROTID ENDARTERECTOMY Left 03/2013  . CAROTID STENT INSERTION Left 03/20/2013   Procedure: CAROTID STENT INSERTION;  Surgeon: [name], MD;  Location: [facility] CATH LAB;  Service: Cardiovascular;  Laterality: Left;  internal carotid  . CEREBRAL ANGIOGRAM N/A 04/05/2011   Procedure: CEREBRAL ANGIOGRAM;  Surgeon: Angelia Mould, MD;  Location: [facility] CATH LAB;  Service: Cardiovascular;  Laterality: N/A;  . CHOLECYSTECTOMY  OPEN  2004  . CORONARY ANGIOPLASTY WITH STENT PLACEMENT  01,02,05,06,07,08,11; 04/24/2013   "I've probably got ~ 10 stents by now" (04/24/2013)  . CORONARY ANGIOPLASTY WITH STENT PLACEMENT  06/13/2013   "got 4 stents today" (06/13/2013)  . CORONARY ARTERY BYPASS GRAFT  1220/11   "CABG X5"  . CORONARY STENT INTERVENTION N/A 10/20/2017   Procedure: CORONARY STENT INTERVENTION;  Surgeon: Troy Sine, MD;  Location: [place] CV LAB;  Service: Cardiovascular;  Laterality: N/A;  . ESOPHAGOGASTRODUODENOSCOPY  08/03/2011   Procedure: ESOPHAGOGASTRODUODENOSCOPY (EGD);  Surgeon: Shann Medal, MD;  Location: [facility];  Service: General;  Laterality: N/A;  . ESOPHAGOGASTRODUODENOSCOPY (EGD) WITH PROPOFOL N/A 03/11/2014   Procedure: ESOPHAGOGASTRODUODENOSCOPY  (EGD) WITH PROPOFOL;  Surgeon: Lafayette Dragon, MD;  Location: [facility] ENDOSCOPY;  Service: Endoscopy;  Laterality: N/A;  . FRACTURE SURGERY    . gall stone removal  05/2003  . GASTRIC RESTRICTION SURGERY  1984   "stapeling"  . HERNIA REPAIR  2004   "in my stomach; had OR on it twice", wire mesh on 1 hernia  . LEFT HEART CATH AND CORS/GRAFTS ANGIOGRAPHY N/A 07/09/2016   Procedure: LEFT HEART CATH AND CORS/GRAFTS ANGIOGRAPHY;  Surgeon: Larey Dresser, MD;  Location: [place] CV LAB;  Service: Cardiovascular;  Laterality: N/A;  . ORIF ANKLE FRACTURE Left 05/16/2018  . ORIF ANKLE FRACTURE Left 05/16/2018   Procedure: OPEN REDUCTION INTERNAL FIXATION (ORIF) Left ankle with possible syndesmosis fixation;  Surgeon: Nicholes Stairs, MD;  Location: [place];  Service: Orthopedics;  Laterality: Left;  169min  . OVARY SURGERY  1970's   "tumor removed"  . PERCUTANEOUS CORONARY STENT INTERVENTION (PCI-S) N/A 06/13/2013   Procedure: PERCUTANEOUS CORONARY STENT INTERVENTION (PCI-S);  Surgeon: [name], MD;  Location: [facility] CATH LAB;  Service: Cardiovascular;  Laterality: N/A;  . PERCUTANEOUS STENT INTERVENTION N/A 04/24/2013   Procedure: PERCUTANEOUS STENT INTERVENTION;  Surgeon: [name], MD;  Location: [facility] CATH LAB;  Service: Cardiovascular;  Laterality: N/A;  . RIGHT/LEFT HEART CATH AND CORONARY/GRAFT ANGIOGRAPHY N/A 10/20/2017   Procedure: RIGHT/LEFT HEART CATH AND CORONARY/GRAFT ANGIOGRAPHY;  Surgeon: Larey Dresser, MD;  Location: [place] CV LAB;  Service: Cardiovascular;  Laterality: N/A;  . ROOT CANAL  10/2000  . TEE WITHOUT CARDIOVERSION N/A 01/18/2019   Procedure: TRANSESOPHAGEAL ECHOCARDIOGRAM (TEE);  Surgeon: Larey Dresser, MD;  Location: [facility] ENDOSCOPY;  Service: Cardiovascular;  Laterality: N/A;  . TIBIA FRACTURE SURGERY Right 1970's   rods and pins  . TOOTH EXTRACTION     "1 on the upper; wisdom tooth on the lower" (01/16/2018)  . TOTAL ABDOMINAL HYSTERECTOMY  1970's    w/ appendectomy    There were no vitals filed for this visit.  Subjective Assessment - 04/16/19 1416    Subjective  Pt. reporting some back pain has been bothering her recently.    Pertinent History  DM II, stroke 2014, 2015, 2016, PVD, supplemental O2 use, migraine, memory change, HTN, HLD, HA, hiatal hernia, GERD, peripheral neuropathy, depression, CAD, claustrophobia, CHF, L BBB, asthma, anxiety, anemia, L ankle ORIF 05/2018, CABG x5 2011    Currently in Pain?  Yes    Pain Score  4     Pain Location  Back    Pain Orientation  Lower;Right;Left    Pain Descriptors / Indicators  Aching    Pain Type  Chronic pain    Multiple Pain Sites  No  [place] Adult PT Treatment/Exercise - 04/16/19 0001      Neuro Re-ed    Neuro Re-ed Details   Tandem forwards walking x 2 laps at counter; backwards walking x 2 laps at counter       Knee/Hip Exercises: Aerobic   Nustep  Lvl 2, 6 min (LE/UE)      Knee/Hip Exercises: Standing   Heel Raises  Both;1 set;15 reps    Heel Raises Limitations  Heel/toe raise holding chair     Knee Flexion  Right;Left;10 reps;Strengthening    Knee Flexion Limitations  1#; in RW    Hip Flexion  Right;Left;10 reps;Knee bent;Stengthening;2 sets    Hip Flexion Limitations  2#; in RW     Hip Abduction  Right;Left;10 reps;Knee straight;Stengthening    Abduction Limitations  side stepping at mat table with therapist 1# at ankles x 2 laps (2 x 10 ft)    Functional Squat  10 reps;3 seconds    Functional Squat Limitations  counter - to chair with airex pad       Knee/Hip Exercises: Seated   Long Arc Quad  Right;Left;10 reps;Strengthening;2 sets    Long Arc Quad Weight  2 lbs.    Long [employer] Limitations  focusing on TKE    Sit to [employer]  10 reps;with UE support;without UE support   intermittent pushoff from knees      Knee/Hip Exercises: Supine   Bridges  Both;10 reps             PT Education - 04/16/19 1511    Education  Details  HEP update;  standing hip abd in RW, standing HS curl in RW, hooklying bridge    Person(s) Educated  Patient    Methods  Explanation;Demonstration;Verbal cues;Handout    Comprehension  Verbalized understanding;Returned demonstration;Verbal cues required       PT Short Term Goals - 04/09/19 1504      PT SHORT TERM GOAL #1   Title  Patient to be independent with initial HEP.    Time  3    Period  Weeks    Status  On-going    Target Date  04/25/19        PT Long Term Goals - 04/09/19 1504      PT LONG TERM GOAL #1   Title  Patient to be independent with advanced HEP.    Time  8    Period  Weeks    Status  On-going      PT LONG TERM GOAL #2   Title  Patient to demonstrate B LE strength >/=4+/5.    Time  8    Period  Weeks    Status  On-going      PT LONG TERM GOAL #3   Title  Patient to score improve TUG score by 10 sec in order to decrease risk of falls.    Baseline  38.34 sec with RW    Time  8    Period  Weeks    Status  On-going      PT LONG TERM GOAL #4   Title  Patient to score improve 5xSTS score by 10 sec without useing UEs in order to decrease risk of falls.    Baseline  28.29 sec pushing off from B armrests    Time  8    Period  Weeks    Status  On-going      PT LONG TERM GOAL #5   Title  Patient to report  tolerance for 15 min of walking with LRAD without fatigue limiting.    Time  8    Period  Weeks    Status  On-going            Plan - 04/16/19 1415    Clinical Impression Statement  Ritika denies soreness after last session.  Did have increased LBP yesterday which has carried over to today after she admits to riding her seated stepper machine at home for one hour.  Discussed with pt. approaching this activity at home with a shorter duration and improved lumbar support (as she sits in [facility] for this activity) as to avoid excessive lumbar strain.  Feels she is tolerating standing HEP well.  Session focused on progression of standing LE  strengthening activities with update per pt.  updated HEP handout issued to pt. with pt. verbalizing understanding.  Pt. with improved gait mechanics today with RW with increased stride length and RW spacing reporting she's been making this adjustment due to previous instruction in PT.  Ended visit with pt. noting LE fatigue and reduction in LBP.    Comorbidities  DM II, stroke 2014, 2015, 2016, PVD, supplemental O2 use, migraine, memory change, HTN, HLD, HA, hiatal hernia, GERD, peripheral neuropathy, depression, CAD, claustrophobia, CHF, L BBB, asthma, anxiety, anemia, L ankle ORIF 05/2018, CABG x5 2011    Rehab Potential  Good    PT Treatment/Interventions  ADLs/Self Care Home Management;Cryotherapy;Electrical Stimulation;Moist Heat;Balance training;Therapeutic exercise;Therapeutic activities;Functional mobility training;Stair training;Gait training;Ultrasound;Neuromuscular re-education;Patient/family education;Manual techniques;Taping;Energy conservation;Dry needling;Passive range of motion    PT Next Visit Plan  LE strengthening; gait training for improved RW safety    Consulted and Agree with Plan of Care  Patient       Patient will benefit from skilled therapeutic intervention in order to improve the following deficits and impairments:  Abnormal gait, Decreased endurance, Cardiopulmonary status limiting activity, Decreased activity tolerance, Decreased strength, Pain, Decreased balance, Decreased mobility, Difficulty walking, Improper body mechanics, Decreased range of motion, Impaired flexibility, Postural dysfunction  Visit Diagnosis: Muscle weakness (generalized)  Unsteadiness on feet  Other abnormalities of gait and mobility  Difficulty in walking, not elsewhere classified     Problem List Patient Active Problem List   Diagnosis Date Noted  . Symptomatic bradycardia 01/16/2019  . Normocytic anemia 01/16/2019  . Diabetic peripheral neuropathy ([place]) 01/12/2019  . Neurologic  abnormality 12/25/2018  . Debility 09/21/2018  . Hypotension 09/18/2018  . Bradycardia 09/18/2018  . AKI (acute kidney injury) ([place]) 09/18/2018  . Pressure injury of skin 09/18/2018  . Ischemic cerebrovascular accident (CVA) of frontal lobe ([place]) 09/13/2018  . Fall 09/09/2018  . History of CVA (cerebrovascular accident) 09/09/2018  . Type II diabetes mellitus with renal manifestations ([place]) 09/09/2018  . CKD (chronic kidney disease), stage III 09/09/2018  . Sepsis ([place]) 09/09/2018  . Depression with anxiety 09/09/2018  . Slurred speech 09/09/2018  . Ankle fracture, left 05/16/2018  . Amiodarone pulmonary toxicity   . Physical deconditioning   . 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 Acute on chronic congestive heart failure ([place])   . Chronic combined systolic (congestive) and diastolic (congestive) heart failure ([place]) 01/16/2018  . Chronic systolic CHF (congestive heart failure) ([place]) 01/16/2018  . Atrial fibrillation ([place]) 10/21/2017  . CAD (coronary artery disease) of bypass graft 10/20/2017  . Coronary artery disease of bypass graft of native heart with stable angina pectoris ([place])   . Chronic low back pain 08/24/2016  . Pain of left thumb 08/05/2016  .  Nocturnal hypoxia 06/02/2016  . Hoarseness 04/05/2016  . Laryngopharyngeal reflux (LPR) 04/05/2016  . Pharyngoesophageal dysphagia 04/05/2016  . Angina decubitus ([place]) 05/22/2015  . Chronic insomnia 05/06/2015  . Common migraine 05/14/2014  . Abnormality of gait 05/14/2014  . Memory change 05/14/2014  . Aneurysm, cerebral, nonruptured 05/14/2014  . Cramp of limb-Left neck 05/10/2014  . Hematemesis with nausea   . Vomiting blood   . Dizziness and giddiness 09/21/2013  . Atypical chest pain 07/18/2013  . Unstable angina ([place]) 04/09/2013  . Carotid artery stenosis, symptomatic 03/20/2013  . Cerebrovascular disease 07/10/2012  . Cerebral artery occlusion with cerebral infarction ([place]) 07/10/2012  . Mitral regurgitation 04/15/2012   . TIA (transient ischemic attack) 04/15/2012  . Occlusion and stenosis of carotid artery without mention of cerebral infarction 08/18/2011  . Bariatric surgery status 06/17/2011  . Speech abnormality 03/22/2011  . Dyspnea 02/24/2011  . PAPILLARY MUSCLE DYSFUNCTION, NON-RHEUMATIC 10/09/2008  . UNSPECIFIED VITAMIN D DEFICIENCY 10/24/2007  . MYOCARDIAL INFARCTION, HX OF 10/24/2007  . PERSISTENT VOMITING 10/24/2007  . OSTEOARTHRITIS 10/24/2007  . MIGRAINES, HX OF 10/24/2007  . Diabetes mellitus type 2, controlled, with complications ([place]) 99/83/3825  . Hyperlipemia 01/16/2007  . Obesity-post failed open gastroplasty 1984  01/16/2007  . OBSTRUCTIVE SLEEP APNEA 01/16/2007  . Essential hypertension 01/16/2007  . Coronary atherosclerosis 01/16/2007  . Asthma 01/16/2007  . GERD 01/16/2007  . VENTRAL HERNIA 01/16/2007    Bess Harvest, PTA 04/16/19 3:26 PM   [facility] [address]  [place], [place], [address] Phone: [phone]   Fax:  [phone]  Name: [name] [name] [name]: [id number] Date of Birth: 1945-10-11

## 2019-04-17 ENCOUNTER — Telehealth (HOSPITAL_COMMUNITY): Payer: Self-pay | Admitting: *Deleted

## 2019-04-17 DIAGNOSIS — I5022 Chronic systolic (congestive) heart failure: Secondary | ICD-10-CM

## 2019-04-17 NOTE — Telephone Encounter (Signed)
-----   Message from Larey Dresser, MD sent at 04/13/2019  5:23 PM EST ----- Hemolyzed, need to repeat.

## 2019-04-17 NOTE — Telephone Encounter (Signed)
[name] [name], [place]  04/17/2019 10:13 AM EDT    Pt called back she is aware and lab appt scheduled.    Shonna Chock, [place]  0/97/0449 2:17 PM EDT    Left message to return call    Larey Dresser, MD  04/13/2019 5:23 PM EST    Hemolyzed, need to repeat.

## 2019-04-18 ENCOUNTER — Other Ambulatory Visit (HOSPITAL_COMMUNITY): Payer: Medicare Other

## 2019-04-19 ENCOUNTER — Other Ambulatory Visit: Payer: Self-pay

## 2019-04-19 ENCOUNTER — Ambulatory Visit: Payer: Medicare Other

## 2019-04-19 ENCOUNTER — Ambulatory Visit (HOSPITAL_COMMUNITY)
Admission: RE | Admit: 2019-04-19 | Discharge: 2019-04-19 | Disposition: A | Discharge status: Home / Self Care | Payer: Medicare Other | Source: Ambulatory Visit | Attending: Internal Medicine | Admitting: Internal Medicine

## 2019-04-19 VITALS — BP 152/70 | HR 77

## 2019-04-19 DIAGNOSIS — I5022 Chronic systolic (congestive) heart failure: Secondary | ICD-10-CM | POA: Diagnosis not present

## 2019-04-19 DIAGNOSIS — E113293 Type 2 diabetes mellitus with mild nonproliferative diabetic retinopathy without macular edema, bilateral: Secondary | ICD-10-CM | POA: Insufficient documentation

## 2019-04-19 DIAGNOSIS — M6281 Muscle weakness (generalized): Secondary | ICD-10-CM

## 2019-04-19 DIAGNOSIS — R262 Difficulty in walking, not elsewhere classified: Secondary | ICD-10-CM

## 2019-04-19 DIAGNOSIS — R2681 Unsteadiness on feet: Secondary | ICD-10-CM

## 2019-04-19 DIAGNOSIS — R2689 Other abnormalities of gait and mobility: Secondary | ICD-10-CM

## 2019-04-19 LAB — BASIC METABOLIC PANEL
Anion gap: 11 (ref 5–15)
BUN: 17 mg/dL (ref 8–23)
CO2: 30 mmol/L (ref 22–32)
Calcium: 9.2 mg/dL (ref 8.9–10.3)
Chloride: 98 mmol/L (ref 98–111)
Creatinine, Ser: 1.5 mg/dL — ABNORMAL HIGH (ref 0.44–1.00)
GFR calc Af Amer: 40 mL/min — ABNORMAL LOW (ref >60)
GFR calc non Af Amer: 35 mL/min — ABNORMAL LOW (ref >60)
Glucose, Bld: 153 mg/dL — ABNORMAL HIGH (ref 70–99)
Potassium: 4.5 mmol/L (ref 3.5–5.1)
Sodium: 139 mmol/L (ref 135–145)

## 2019-04-19 NOTE — Therapy (Signed)
[facility] [address]  [place], [place], [address] Phone: [phone]   Fax:  [phone]  Physical Therapy Treatment  Patient Details  Name: [name] [name] [name]: [id number] Date of Birth: Oct 26, 1945 Referring Provider (PT): Loralie Champagne, MD   Encounter Date: 04/19/2019  PT End of Session - 04/19/19 1424    Visit Number  5    Number of Visits  17    Date for PT Re-Evaluation  05/30/19    Authorization Type  Medicare & AARP    PT Start Time  1405    PT Stop Time  1445    PT Time Calculation (min)  40 min    Equipment Utilized During Treatment  --    Activity Tolerance  Patient tolerated treatment well;Patient limited by fatigue    Behavior During Therapy  [facility] for tasks assessed/performed       Past Medical History:  Diagnosis Date  . Anemia    hx  . Anxiety   . Asthma   . Basal cell carcinoma 05/2014   "left shoulder"  . Bundle branch block, left    chronic/notes 07/18/2013  . CHF (congestive heart failure) ([place])   . Chronic insomnia 05/06/2015  . Chronic kidney disease    frequency, sees dr Jamal Maes every 4 to 6 months (01/16/2018)  . Chronic lower back pain   . Claustrophobia   . Common migraine 05/14/2014  . Coronary artery disease    MI in 2001, 2002, 2006, 2011, 2014  . Depression   . Diabetic peripheral neuropathy ([place]) 01/12/2019  . GERD (gastroesophageal reflux disease)   . H/O hiatal hernia   . Headache    "at least 2/month" (01/16/2018)  . Heart murmur   . Hyperlipidemia   . Hypertension   . Memory change 05/14/2014  . Migraine    "5-6/year"  (01/16/2018)  . Obesity 01-2010  . Obstructive sleep apnea    "can't wear machine; I have claustrophobia" (01/16/2018), states she had a 2nd sleep study and does not have sleep apnea, her O2 decreases and now is on 2 L of O2 at night.  . On home oxygen therapy    "2L at night and prn during daytime" (01/16/2018)  . Osteoarthritis    "knees and hands" (01/16/2018)  . Peripheral vascular disease (HCC)    ? numbness, tingling arms and legs  . PONV (postoperative nausea and vomiting)   . Stroke [facility])    2014, 2015, 2016  . Type II diabetes mellitus ([place])   . Ventral hernia    hx of    Past Surgical History:  Procedure Laterality Date  . ANKLE FRACTURE SURGERY Left 1970's  . APPENDECTOMY  1970's   w/hysterectomy  . BASAL CELL CARCINOMA EXCISION Left 05/2014   "shoulder" (01/16/2018)  . CARDIAC CATHETERIZATION  10/10/2012   Dr [name].  Marland Kitchen CARDIAC CATHETERIZATION N/A 05/29/2015   Procedure: Right/Left Heart Cath and Coronary/Graft Angiography;  Surgeon: Larey Dresser, MD;  Location: [place] CV LAB;  Service: Cardiovascular;  Laterality: N/A;  . CAROTID ENDARTERECTOMY Left 03/2013  . CAROTID STENT INSERTION Left 03/20/2013   Procedure: CAROTID STENT INSERTION;  Surgeon: [name], MD;  Location: [facility] CATH LAB;  Service: Cardiovascular;  Laterality: Left;  internal carotid  . CEREBRAL ANGIOGRAM N/A 04/05/2011   Procedure: CEREBRAL ANGIOGRAM;  Surgeon: Angelia Mould, MD;  Location: [facility] CATH LAB;  Service: Cardiovascular;  Laterality: N/A;  . CHOLECYSTECTOMY OPEN  2004  . CORONARY ANGIOPLASTY WITH STENT PLACEMENT  01,02,05,06,07,08,11; 04/24/2013   "I've probably got ~ 10 stents by now" (04/24/2013)  . CORONARY ANGIOPLASTY WITH STENT PLACEMENT  06/13/2013   "got 4 stents today" (06/13/2013)  . CORONARY ARTERY BYPASS GRAFT  1220/11   "CABG X5"  . CORONARY STENT INTERVENTION N/A 10/20/2017   Procedure: CORONARY STENT INTERVENTION;  Surgeon: Troy Sine, MD;  Location: [place] CV LAB;  Service: Cardiovascular;  Laterality: N/A;  . ESOPHAGOGASTRODUODENOSCOPY  08/03/2011   Procedure: ESOPHAGOGASTRODUODENOSCOPY (EGD);  Surgeon: Shann Medal, MD;  Location: [facility];  Service: General;  Laterality: N/A;  . ESOPHAGOGASTRODUODENOSCOPY (EGD) WITH PROPOFOL N/A 03/11/2014   Procedure: ESOPHAGOGASTRODUODENOSCOPY  (EGD) WITH PROPOFOL;  Surgeon: Lafayette Dragon, MD;  Location: [facility] ENDOSCOPY;  Service: Endoscopy;  Laterality: N/A;  . FRACTURE SURGERY    . gall stone removal  05/2003  . GASTRIC RESTRICTION SURGERY  1984   "stapeling"  . HERNIA REPAIR  2004   "in my stomach; had OR on it twice", wire mesh on 1 hernia  . LEFT HEART CATH AND CORS/GRAFTS ANGIOGRAPHY N/A 07/09/2016   Procedure: LEFT HEART CATH AND CORS/GRAFTS ANGIOGRAPHY;  Surgeon: Larey Dresser, MD;  Location: [place] CV LAB;  Service: Cardiovascular;  Laterality: N/A;  . ORIF ANKLE FRACTURE Left 05/16/2018  . ORIF ANKLE FRACTURE Left 05/16/2018   Procedure: OPEN REDUCTION INTERNAL FIXATION (ORIF) Left ankle with possible syndesmosis fixation;  Surgeon: Nicholes Stairs, MD;  Location: [place];  Service: Orthopedics;  Laterality: Left;  162min  . OVARY SURGERY  1970's   "tumor removed"  . PERCUTANEOUS CORONARY STENT INTERVENTION (PCI-S) N/A 06/13/2013   Procedure: PERCUTANEOUS CORONARY STENT INTERVENTION (PCI-S);  Surgeon: [name], MD;  Location: [facility] CATH LAB;  Service: Cardiovascular;  Laterality: N/A;  . PERCUTANEOUS STENT INTERVENTION N/A 04/24/2013   Procedure: PERCUTANEOUS STENT INTERVENTION;  Surgeon: [name], MD;  Location: [facility] CATH LAB;  Service: Cardiovascular;  Laterality: N/A;  . RIGHT/LEFT HEART CATH AND CORONARY/GRAFT ANGIOGRAPHY N/A 10/20/2017   Procedure: RIGHT/LEFT HEART CATH AND CORONARY/GRAFT ANGIOGRAPHY;  Surgeon: Larey Dresser, MD;  Location: [place] CV LAB;  Service: Cardiovascular;  Laterality: N/A;  . ROOT CANAL  10/2000  . TEE WITHOUT CARDIOVERSION N/A 01/18/2019   Procedure: TRANSESOPHAGEAL ECHOCARDIOGRAM (TEE);  Surgeon: Larey Dresser, MD;  Location: [facility] ENDOSCOPY;  Service: Cardiovascular;  Laterality: N/A;  . TIBIA FRACTURE SURGERY Right 1970's   rods and pins  . TOOTH EXTRACTION     "1 on the upper; wisdom tooth on the lower" (01/16/2018)  . TOTAL ABDOMINAL HYSTERECTOMY  1970's    w/ appendectomy    Vitals:   04/19/19 1422  BP: (!) 152/70  Pulse: 77  SpO2: 92%    Subjective Assessment - 04/19/19 1422    Subjective  Pt. noting increased LBP since being in doctors office and eye doctor for three hours earlier today.    Pertinent History  DM II, stroke 2014, 2015, 2016, PVD, supplemental O2 use, migraine, memory change, HTN, HLD, HA, hiatal hernia, GERD, peripheral neuropathy, depression, CAD, claustrophobia, CHF, L BBB, asthma, anxiety, anemia, L ankle ORIF 05/2018, CABG x5 2011    Diagnostic tests  none recent    Patient Stated Goals  "get to walking again and get control of my fatigue"    Currently in Pain?  Yes    Pain Score  7     Pain Location  Back    Pain Orientation  Lower;Right;Left  Pain Descriptors / Indicators  Aching    Pain Type  Chronic pain    Multiple Pain Sites  No                       [facility] Adult PT Treatment/Exercise - 04/19/19 0001      Transfers   Transfers  Sit to Stand    Sit to Stand  5: Supervision    Sit to Stand Details (indicate cue type and reason)  cues required to avoid pulling up on RW however pt. verbalizing understanding qiuckly reporting she has been "trying to remember at home"      Knee/Hip Exercises: Aerobic   Nustep  Lvl 2, 6 min (LE/UE)      Knee/Hip Exercises: Standing   Heel Raises  Both;1 set;15 reps    Heel Raises Limitations  heel raise     Knee Flexion  Right;Left;10 reps;Strengthening    Knee Flexion Limitations  1#; in RW    Hip Flexion  Right;Left;10 reps;Knee bent;Stengthening;1 set    Hip Flexion Limitations  1#; in RW     Functional Squat  10 reps;3 seconds    Functional Squat Limitations  counter - to chair       Knee/Hip Exercises: Supine   Bridges  Both;15 reps;Strengthening    Bridges Limitations  cues 3" hold time         Manual therapy:  STM/strumming to B lumbar paraspinals, QL, superior buttocks in sitting - pt. Noting good relief from pain        PT Short  Term Goals - 04/19/19 1425      PT SHORT TERM GOAL #1   Title  Patient to be independent with initial HEP.    Time  3    Period  Weeks    Status  Achieved    Target Date  04/25/19        PT Long Term Goals - 04/09/19 1504      PT LONG TERM GOAL #1   Title  Patient to be independent with advanced HEP.    Time  8    Period  Weeks    Status  On-going      PT LONG TERM GOAL #2   Title  Patient to demonstrate B LE strength >/=4+/5.    Time  8    Period  Weeks    Status  On-going      PT LONG TERM GOAL #3   Title  Patient to score improve TUG score by 10 sec in order to decrease risk of falls.    Baseline  38.34 sec with RW    Time  8    Period  Weeks    Status  On-going      PT LONG TERM GOAL #4   Title  Patient to score improve 5xSTS score by 10 sec without useing UEs in order to decrease risk of falls.    Baseline  28.29 sec pushing off from B armrests    Time  8    Period  Weeks    Status  On-going      PT LONG TERM GOAL #5   Title  Patient to report tolerance for 15 min of walking with LRAD without fatigue limiting.    Time  8    Period  Weeks    Status  On-going            Plan - 04/19/19 1425  Clinical Impression Statement  [name] [name] reporting increased LBP after being in eye doctor's office for three hours earlier today.  Was able to provide initial relief from LBP in session today with gentle STM/strumming to B lumbar paraspinals/superior buttocks and gentle lumbar stretching with seated green p-ball rollouts.  Duration of session focused on LE strengthening in standing to pt. tolerance.  Ended session with pt. noting reduction in LBP.     Comorbidities  DM II, stroke 2014, 2015, 2016, PVD, supplemental O2 use, migraine, memory change, HTN, HLD, HA, hiatal hernia, GERD, peripheral neuropathy, depression, CAD, claustrophobia, CHF, L BBB, asthma, anxiety, anemia, L ankle ORIF 05/2018, CABG x5 2011    Rehab Potential  Good    PT Treatment/Interventions   ADLs/Self Care Home Management;Cryotherapy;Electrical Stimulation;Moist Heat;Balance training;Therapeutic exercise;Therapeutic activities;Functional mobility training;Stair training;Gait training;Ultrasound;Neuromuscular re-education;Patient/family education;Manual techniques;Taping;Energy conservation;Dry needling;Passive range of motion    PT Next Visit Plan  LE strengthening; gait training for improved RW safety    Consulted and Agree with Plan of Care  Patient       Patient will benefit from skilled therapeutic intervention in order to improve the following deficits and impairments:  Abnormal gait, Decreased endurance, Cardiopulmonary status limiting activity, Decreased activity tolerance, Decreased strength, Pain, Decreased balance, Decreased mobility, Difficulty walking, Improper body mechanics, Decreased range of motion, Impaired flexibility, Postural dysfunction  Visit Diagnosis: Muscle weakness (generalized)  Unsteadiness on feet  Other abnormalities of gait and mobility  Difficulty in walking, not elsewhere classified     Problem List Patient Active Problem List   Diagnosis Date Noted  . Symptomatic bradycardia 01/16/2019  . Normocytic anemia 01/16/2019  . Diabetic peripheral neuropathy ([place]) 01/12/2019  . Neurologic abnormality 12/25/2018  . Debility 09/21/2018  . Hypotension 09/18/2018  . Bradycardia 09/18/2018  . AKI (acute kidney injury) ([place]) 09/18/2018  . Pressure injury of skin 09/18/2018  . Ischemic cerebrovascular accident (CVA) of frontal lobe ([place]) 09/13/2018  . Fall 09/09/2018  . History of CVA (cerebrovascular accident) 09/09/2018  . Type II diabetes mellitus with renal manifestations ([place]) 09/09/2018  . CKD (chronic kidney disease), stage III 09/09/2018  . Sepsis ([place]) 09/09/2018  . Depression with anxiety 09/09/2018  . Slurred speech 09/09/2018  . Ankle fracture, left 05/16/2018  . Amiodarone pulmonary toxicity   . Physical deconditioning   . ITP  secondary to infection   . Acute on chronic congestive heart failure ([place])   . Chronic combined systolic (congestive) and diastolic (congestive) heart failure ([place]) 01/16/2018  . Chronic systolic CHF (congestive heart failure) ([place]) 01/16/2018  . Atrial fibrillation ([place]) 10/21/2017  . CAD (coronary artery disease) of bypass graft 10/20/2017  . Coronary artery disease of bypass graft of native heart with stable angina pectoris ([place])   . Chronic low back pain 08/24/2016  . Pain of left thumb 08/05/2016  . Nocturnal hypoxia 06/02/2016  . Hoarseness 04/05/2016  . Laryngopharyngeal reflux (LPR) 04/05/2016  . Pharyngoesophageal dysphagia 04/05/2016  . Angina decubitus ([place]) 05/22/2015  . Chronic insomnia 05/06/2015  . Common migraine 05/14/2014  . Abnormality of gait 05/14/2014  . Memory change 05/14/2014  . Aneurysm, cerebral, nonruptured 05/14/2014  . Cramp of limb-Left neck 05/10/2014  . Hematemesis with nausea   . Vomiting blood   . Dizziness and giddiness 09/21/2013  . Atypical chest pain 07/18/2013  . Unstable angina ([place]) 04/09/2013  . Carotid artery stenosis, symptomatic 03/20/2013  . Cerebrovascular disease 07/10/2012  . Cerebral artery occlusion with cerebral infarction ([place]) 07/10/2012  . Mitral regurgitation 04/15/2012  .  TIA (transient ischemic attack) 04/15/2012  . Occlusion and stenosis of carotid artery without mention of cerebral infarction 08/18/2011  . Bariatric surgery status 06/17/2011  . Speech abnormality 03/22/2011  . Dyspnea 02/24/2011  . PAPILLARY MUSCLE DYSFUNCTION, NON-RHEUMATIC 10/09/2008  . UNSPECIFIED VITAMIN D DEFICIENCY 10/24/2007  . MYOCARDIAL INFARCTION, HX OF 10/24/2007  . PERSISTENT VOMITING 10/24/2007  . OSTEOARTHRITIS 10/24/2007  . MIGRAINES, HX OF 10/24/2007  . Diabetes mellitus type 2, controlled, with complications ([place]) 03/54/6568  . Hyperlipemia 01/16/2007  . Obesity-post failed open gastroplasty 1984  01/16/2007  . OBSTRUCTIVE SLEEP  APNEA 01/16/2007  . Essential hypertension 01/16/2007  . Coronary atherosclerosis 01/16/2007  . Asthma 01/16/2007  . GERD 01/16/2007  . VENTRAL HERNIA 01/16/2007    Bess Harvest, PTA 04/19/19 5:36 PM   [facility] [address]  [place], [place], [address] Phone: [phone]   Fax:  [phone]  Name: [name] [name] [name]: [id number] Date of Birth: September 12, 1945

## 2019-04-23 ENCOUNTER — Ambulatory Visit: Payer: Medicare Other

## 2019-04-23 ENCOUNTER — Other Ambulatory Visit: Payer: Self-pay

## 2019-04-23 VITALS — BP 122/80 | HR 88

## 2019-04-23 DIAGNOSIS — R2681 Unsteadiness on feet: Secondary | ICD-10-CM

## 2019-04-23 DIAGNOSIS — M6281 Muscle weakness (generalized): Secondary | ICD-10-CM

## 2019-04-23 DIAGNOSIS — R2689 Other abnormalities of gait and mobility: Secondary | ICD-10-CM

## 2019-04-23 DIAGNOSIS — R262 Difficulty in walking, not elsewhere classified: Secondary | ICD-10-CM

## 2019-04-23 NOTE — Therapy (Signed)
[facility] [address]  [place], [place], [address] Phone: [phone]   Fax:  [phone]  Physical Therapy Treatment  Patient Details  Name: [name] [name] [name]: [id number] Date of Birth: [birth year] Referring Provider (PT): Loralie Champagne, MD   Encounter Date: 04/23/2019  PT End of Session - 04/23/19 1419    Visit Number  6    Number of Visits  17    Date for PT Re-Evaluation  05/30/19    Authorization Type  Medicare & AARP    PT Start Time  1405    PT Stop Time  1447    PT Time Calculation (min)  42 min    Activity Tolerance  Patient tolerated treatment well;Patient limited by fatigue    Behavior During Therapy  [facility] for tasks assessed/performed       Past Medical History:  Diagnosis Date  . Anemia    hx  . Anxiety   . Asthma   . Basal cell carcinoma 05/2014   "left shoulder"  . Bundle branch block, left    chronic/notes 07/18/2013  . CHF (congestive heart failure) ([place])   . Chronic insomnia 05/06/2015  . Chronic kidney disease    frequency, sees dr Jamal Maes every 4 to 6 months (01/16/2018)  . Chronic lower back pain   . Claustrophobia   . Common migraine 05/14/2014  . Coronary artery disease    MI in 2001, 2002, 2006, 2011, 2014  . Depression   . Diabetic peripheral neuropathy ([place]) 01/12/2019  . GERD (gastroesophageal reflux disease)   . H/O hiatal hernia   . Headache    "at least 2/month" (01/16/2018)  . Heart murmur   . Hyperlipidemia   . Hypertension   . Memory change 05/14/2014  . Migraine    "5-6/year"  (01/16/2018)  . Obesity 01-2010  . Obstructive sleep apnea    "can't wear machine; I have claustrophobia" (01/16/2018), states she had a 2nd sleep study and does not have sleep apnea, her O2 decreases and now is on 2 L of O2 at night.  . On home oxygen therapy    "2L at night and prn during daytime" (01/16/2018)  . Osteoarthritis    "knees and hands" (01/16/2018)  . Peripheral  vascular disease (HCC)    ? numbness, tingling arms and legs  . PONV (postoperative nausea and vomiting)   . Stroke [facility])    2014, 2015, 2016  . Type II diabetes mellitus ([place])   . Ventral hernia    hx of    Past Surgical History:  Procedure Laterality Date  . ANKLE FRACTURE SURGERY Left 1970's  . APPENDECTOMY  1970's   w/hysterectomy  . BASAL CELL CARCINOMA EXCISION Left 05/2014   "shoulder" (01/16/2018)  . CARDIAC CATHETERIZATION  10/10/2012   Dr [name].  Marland Kitchen CARDIAC CATHETERIZATION N/A 05/29/2015   Procedure: Right/Left Heart Cath and Coronary/Graft Angiography;  Surgeon: Larey Dresser, MD;  Location: [facility] CV [facility];  Service: Cardiovascular;  Laterality: N/A;  . CAROTID ENDARTERECTOMY Left 03/2013  . CAROTID STENT INSERTION Left 03/20/2013   Procedure: CAROTID STENT INSERTION;  Surgeon: [name], MD;  Location: [facility] CATH LAB;  Service: Cardiovascular;  Laterality: Left;  internal carotid  . CEREBRAL ANGIOGRAM N/A 04/05/2011   Procedure: CEREBRAL ANGIOGRAM;  Surgeon: Angelia Mould, MD;  Location: [facility] CATH LAB;  Service: Cardiovascular;  Laterality: N/A;  . CHOLECYSTECTOMY OPEN  2004  . CORONARY ANGIOPLASTY WITH STENT PLACEMENT  01,02,05,06,07,08,11; 04/24/2013   "I've probably got ~ 10 stents by now" (04/24/2013)  . CORONARY ANGIOPLASTY WITH STENT PLACEMENT  06/13/2013   "got 4 stents today" (06/13/2013)  . CORONARY ARTERY BYPASS GRAFT  1220/11   "CABG X5"  . CORONARY STENT INTERVENTION N/A 10/20/2017   Procedure: CORONARY STENT INTERVENTION;  Surgeon: Troy Sine, MD;  Location: [place] CV LAB;  Service: Cardiovascular;  Laterality: N/A;  . ESOPHAGOGASTRODUODENOSCOPY  08/03/2011   Procedure: ESOPHAGOGASTRODUODENOSCOPY (EGD);  Surgeon: Shann Medal, MD;  Location: [facility];  Service: General;  Laterality: N/A;  . ESOPHAGOGASTRODUODENOSCOPY (EGD) WITH PROPOFOL N/A 03/11/2014   Procedure: ESOPHAGOGASTRODUODENOSCOPY (EGD) WITH PROPOFOL;  Surgeon: Lafayette Dragon, MD;   Location: [facility] ENDOSCOPY;  Service: Endoscopy;  Laterality: N/A;  . FRACTURE SURGERY    . gall stone removal  05/2003  . GASTRIC RESTRICTION SURGERY  1984   "stapeling"  . HERNIA REPAIR  2004   "in my stomach; had OR on it twice", wire mesh on 1 hernia  . LEFT HEART CATH AND CORS/GRAFTS ANGIOGRAPHY N/A 07/09/2016   Procedure: LEFT HEART CATH AND CORS/GRAFTS ANGIOGRAPHY;  Surgeon: Larey Dresser, MD;  Location: [place] [facility];  Service: Cardiovascular;  Laterality: N/A;  . ORIF ANKLE FRACTURE Left 05/16/2018  . ORIF ANKLE FRACTURE Left 05/16/2018   Procedure: OPEN REDUCTION INTERNAL FIXATION (ORIF) Left ankle with possible syndesmosis fixation;  Surgeon: Nicholes Stairs, MD;  Location: [facility];  Service: Orthopedics;  Laterality: Left;  161mn  . OVARY SURGERY  1970's   "tumor removed"  . PERCUTANEOUS CORONARY STENT INTERVENTION (PCI-S) N/A 06/13/2013   Procedure: PERCUTANEOUS CORONARY STENT INTERVENTION (PCI-S);  Surgeon: JJettie Booze MD;  [facility];  Service: Cardiovascular;  Laterality: N/A;  . PERCUTANEOUS STENT INTERVENTION N/A 04/24/2013   Procedure: PERCUTANEOUS STENT INTERVENTION;  Surgeon: JJettie Booze MD;  [facility];  Service: Cardiovascular;  Laterality: N/A;  . RIGHT/LEFT HEART CATH AND CORONARY/GRAFT ANGIOGRAPHY N/A 10/20/2017   Procedure: RIGHT/LEFT HEART CATH AND CORONARY/GRAFT ANGIOGRAPHY;  Surgeon: MLarey Dresser MD;  [facility];  Service: Cardiovascular;  Laterality: N/A;  . ROOT CANAL  10/2000  . TEE WITHOUT CARDIOVERSION N/A 01/18/2019   Procedure: TRANSESOPHAGEAL ECHOCARDIOGRAM (TEE);  Surgeon: MLarey Dresser MD;  [facility];  Service: Cardiovascular;  Laterality: N/A;  . TIBIA FRACTURE SURGERY Right 1970's   rods and pins  . TOOTH EXTRACTION     "1 on the upper; wisdom tooth on the lower" (01/16/2018)  . TOTAL ABDOMINAL HYSTERECTOMY  1970's   w/ appendectomy    Vitals:   04/23/19 1418   BP: 122/80  Pulse: 88  SpO2: 97%    Subjective Assessment - 04/23/19 1418    Subjective  Pt. with complaint of LBP however admits to overdoing it on home bike again over weekend.    Pertinent History  DM II, stroke 2014, 2015, 2016, PVD, supplemental O2 use, migraine, memory change, HTN, HLD, HA, hiatal hernia, GERD, peripheral neuropathy, depression, CAD, claustrophobia, CHF, L BBB, asthma, anxiety, anemia, L ankle ORIF 05/2018, CABG x5 2011    Patient Stated Goals  "get to walking again and get control of my fatigue"    Currently in Pain?  Yes    Pain Score  5     Pain Location  Back    Pain Orientation  Lower;Right;Left    Pain Descriptors / Indicators  Aching    Pain Type  Chronic pain  Multiple Pain Sites  No         [facility] PT Assessment - 04/23/19 0001      Timed Up and Go Test   Normal TUG (seconds)  18.53                   [facility] Adult PT Treatment/Exercise - 04/23/19 0001      Knee/Hip Exercises: Aerobic   Nustep  Lvl 2, 6 min (LE/UE)      Knee/Hip Exercises: Standing   Heel Raises  Both;1 set;15 reps    Heel Raises Limitations  heel raise     Knee Flexion  Right;Left;10 reps;Strengthening    Knee Flexion Limitations  1#; in RW    Hip Flexion  Right;Left;10 reps;Knee bent;Stengthening;1 set    Hip Flexion Limitations  1#; in RW     Hip Abduction  Right;Left;10 reps;Knee straight;Stengthening    Abduction Limitations  at counter     Forward Step Up  Right;Left;10 reps;Step Height: 4";Hand Hold: 2    Forward Step Up Limitations  at counter and RW hand hold       Knee/Hip Exercises: Seated   Long Arc Quad  Right;Left;Strengthening;1 set   x 12 reps    Long Arc Quad Weight  1 lbs.    Long [employer] Limitations  focusing on TKE    Sit to [employer]  2 sets;5 reps   no UE pushoff               PT Short Term Goals - 04/19/19 1425      PT SHORT TERM GOAL #1   Title  Patient to be independent with initial HEP.    Time  3    Period  Weeks     Status  Achieved    Target Date  04/25/19        PT Long Term Goals - 04/23/19 1823      PT LONG TERM GOAL #1   Title  Patient to be independent with advanced HEP.    Time  8    Period  Weeks    Status  On-going      PT LONG TERM GOAL #2   Title  Patient to demonstrate B LE strength >/=4+/5.    Time  8    Period  Weeks    Status  On-going      PT LONG TERM GOAL #3   Title  Patient to score improve TUG score by 10 sec in order to decrease risk of falls.    Baseline  38.34 sec with RW    Time  8    Period  Weeks    Status  Achieved      PT LONG TERM GOAL #4   Title  Patient to score improve 5xSTS score by 10 sec without useing UEs in order to decrease risk of falls.    Baseline  28.29 sec pushing off from B armrests    Time  8    Period  Weeks    Status  On-going      PT LONG TERM GOAL #5   Title  Patient to report tolerance for 15 min of walking with LRAD without fatigue limiting.    Time  8    Period  Weeks    Status  On-going            Plan - 04/23/19 1422    Clinical Impression Statement  [name] [name] reporting typical 1 day of  muscular soreness after last session.  Also admits to "overdoing it on leg bike" at home.  Was able to demo >10sec improvement in TUG (time up and go) time with RW demonstrating improved functional mobility with RW.  Progressing with LE strengthening well and vitals remained [facility] for today's session.  LTG #3 met.    Comorbidities  DM II, stroke 2014, 2015, 2016, PVD, supplemental O2 use, migraine, memory change, HTN, HLD, HA, hiatal hernia, GERD, peripheral neuropathy, depression, CAD, claustrophobia, CHF, L BBB, asthma, anxiety, anemia, L ankle ORIF 05/2018, CABG x5 2011    Rehab Potential  Good    PT Treatment/Interventions  ADLs/Self Care Home Management;Cryotherapy;Electrical Stimulation;Moist Heat;Balance training;Therapeutic exercise;Therapeutic activities;Functional mobility training;Stair training;Gait training;Ultrasound;Neuromuscular  re-education;Patient/family education;Manual techniques;Taping;Energy conservation;Dry needling;Passive range of motion    PT Next Visit Plan  LE strengthening; gait training for improved RW safety    Consulted and Agree with Plan of Care  Patient       Patient will benefit from skilled therapeutic intervention in order to improve the following deficits and impairments:  Abnormal gait, Decreased endurance, Cardiopulmonary status limiting activity, Decreased activity tolerance, Decreased strength, Pain, Decreased balance, Decreased mobility, Difficulty walking, Improper body mechanics, Decreased range of motion, Impaired flexibility, Postural dysfunction  Visit Diagnosis: Muscle weakness (generalized)  Unsteadiness on feet  Other abnormalities of gait and mobility  Difficulty in walking, not elsewhere classified     Problem List Patient Active Problem List   Diagnosis Date Noted  . Symptomatic bradycardia 01/16/2019  . Normocytic anemia 01/16/2019  . Diabetic peripheral neuropathy ([place]) 01/12/2019  . Neurologic abnormality 12/25/2018  . Debility 09/21/2018  . Hypotension 09/18/2018  . Bradycardia 09/18/2018  . AKI (acute kidney injury) ([place]) 09/18/2018  . Pressure injury of skin 09/18/2018  . Ischemic cerebrovascular accident (CVA) of frontal lobe ([place]) 09/13/2018  . Fall 09/09/2018  . History of CVA (cerebrovascular accident) 09/09/2018  . Type II diabetes mellitus with renal manifestations ([place]) 09/09/2018  . CKD (chronic kidney disease), stage III 09/09/2018  . Sepsis ([place]) 09/09/2018  . Depression with anxiety 09/09/2018  . Slurred speech 09/09/2018  . Ankle fracture, left 05/16/2018  . Amiodarone pulmonary toxicity   . Physical deconditioning   . 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 Acute on chronic congestive heart failure ([place])   . Chronic combined systolic (congestive) and diastolic (congestive) heart failure ([place]) 01/16/2018  . Chronic systolic CHF (congestive heart  failure) ([place]) 01/16/2018  . Atrial fibrillation ([place]) 10/21/2017  . CAD (coronary artery disease) of bypass graft 10/20/2017  . Coronary artery disease of bypass graft of native heart with stable angina pectoris ([place])   . Chronic low back pain 08/24/2016  . Pain of left thumb 08/05/2016  . Nocturnal hypoxia 06/02/2016  . Hoarseness 04/05/2016  . Laryngopharyngeal reflux (LPR) 04/05/2016  . Pharyngoesophageal dysphagia 04/05/2016  . Angina decubitus ([place]) 05/22/2015  . Chronic insomnia 05/06/2015  . Common migraine 05/14/2014  . Abnormality of gait 05/14/2014  . Memory change 05/14/2014  . Aneurysm, cerebral, nonruptured 05/14/2014  . Cramp of limb-Left neck 05/10/2014  . Hematemesis with nausea   . Vomiting blood   . Dizziness and giddiness 09/21/2013  . Atypical chest pain 07/18/2013  . Unstable angina ([place]) 04/09/2013  . Carotid artery stenosis, symptomatic 03/20/2013  . Cerebrovascular disease 07/10/2012  . Cerebral artery occlusion with cerebral infarction ([place]) 07/10/2012  . Mitral regurgitation 04/15/2012  . TIA (transient ischemic attack) 04/15/2012  . Occlusion and stenosis of carotid artery without mention of  cerebral infarction 08/18/2011  . Bariatric surgery status 06/17/2011  . Speech abnormality 03/22/2011  . Dyspnea 02/24/2011  . PAPILLARY MUSCLE DYSFUNCTION, NON-RHEUMATIC 10/09/2008  . UNSPECIFIED VITAMIN D DEFICIENCY 10/24/2007  . MYOCARDIAL INFARCTION, HX OF 10/24/2007  . PERSISTENT VOMITING 10/24/2007  . OSTEOARTHRITIS 10/24/2007  . MIGRAINES, HX OF 10/24/2007  . Diabetes mellitus type 2, controlled, with complications ([place]) 58/59/2924  . Hyperlipemia 01/16/2007  . Obesity-post failed open gastroplasty 1984  01/16/2007  . OBSTRUCTIVE SLEEP APNEA 01/16/2007  . Essential hypertension 01/16/2007  . Coronary atherosclerosis 01/16/2007  . Asthma 01/16/2007  . GERD 01/16/2007  . VENTRAL HERNIA 01/16/2007    Bess Harvest, PTA 04/23/19 6:25 PM   [facility] [address]  [place], [place], [address] Phone: [phone]   Fax:  [phone]  Name: [name] [name] [name]: [id number] Date of Birth: 02-Mar-1945

## 2019-04-26 ENCOUNTER — Encounter: Payer: Self-pay | Admitting: Physical Therapy

## 2019-04-26 ENCOUNTER — Ambulatory Visit: Payer: Medicare Other | Admitting: Physical Therapy

## 2019-04-26 ENCOUNTER — Other Ambulatory Visit: Payer: Self-pay

## 2019-04-26 VITALS — BP 110/63 | HR 71

## 2019-04-26 DIAGNOSIS — M6281 Muscle weakness (generalized): Secondary | ICD-10-CM

## 2019-04-26 DIAGNOSIS — R262 Difficulty in walking, not elsewhere classified: Secondary | ICD-10-CM

## 2019-04-26 DIAGNOSIS — R2689 Other abnormalities of gait and mobility: Secondary | ICD-10-CM

## 2019-04-26 DIAGNOSIS — R2681 Unsteadiness on feet: Secondary | ICD-10-CM

## 2019-04-26 NOTE — Therapy (Signed)
[facility] [address]  [place], [place], [address] Phone: [phone]   Fax:  [phone]  Physical Therapy Treatment  Patient Details  Name: [name] [name] [name]: [id number] Date of Birth: 10/10/1945 Referring Provider (PT): Loralie Champagne, MD   Encounter Date: 04/26/2019  PT End of Session - 04/26/19 1714    Visit Number  7    Number of Visits  17    Date for PT Re-Evaluation  05/30/19    Authorization Type  Medicare & AARP    PT Start Time  1401    PT Stop Time  1443    PT Time Calculation (min)  42 min    Activity Tolerance  Patient tolerated treatment well;Patient limited by fatigue    Behavior During Therapy  [facility] for tasks assessed/performed       Past Medical History:  Diagnosis Date  . Anemia    hx  . Anxiety   . Asthma   . Basal cell carcinoma 05/2014   "left shoulder"  . Bundle branch block, left    chronic/notes 07/18/2013  . CHF (congestive heart failure) ([place])   . Chronic insomnia 05/06/2015  . Chronic kidney disease    frequency, sees dr Jamal Maes every 4 to 6 months (01/16/2018)  . Chronic lower back pain   . Claustrophobia   . Common migraine 05/14/2014  . Coronary artery disease    MI in 2001, 2002, 2006, 2011, 2014  . Depression   . Diabetic peripheral neuropathy ([place]) 01/12/2019  . GERD (gastroesophageal reflux disease)   . H/O hiatal hernia   . Headache    "at least 2/month" (01/16/2018)  . Heart murmur   . Hyperlipidemia   . Hypertension   . Memory change 05/14/2014  . Migraine    "5-6/year"  (01/16/2018)  . Obesity 01-2010  . Obstructive sleep apnea    "can't wear machine; I have claustrophobia" (01/16/2018), states she had a 2nd sleep study and does not have sleep apnea, her O2 decreases and now is on 2 L of O2 at night.  . On home oxygen therapy    "2L at night and prn during daytime" (01/16/2018)  . Osteoarthritis    "knees and hands" (01/16/2018)  . Peripheral  vascular disease (HCC)    ? numbness, tingling arms and legs  . PONV (postoperative nausea and vomiting)   . Stroke [facility])    2014, 2015, 2016  . Type II diabetes mellitus ([place])   . Ventral hernia    hx of    Past Surgical History:  Procedure Laterality Date  . ANKLE FRACTURE SURGERY Left 1970's  . APPENDECTOMY  1970's   w/hysterectomy  . BASAL CELL CARCINOMA EXCISION Left 05/2014   "shoulder" (01/16/2018)  . CARDIAC CATHETERIZATION  10/10/2012   Dr [name].  Marland Kitchen CARDIAC CATHETERIZATION N/A 05/29/2015   Procedure: Right/Left Heart Cath and Coronary/Graft Angiography;  Surgeon: Larey Dresser, MD;  Location: [place] CV LAB;  Service: Cardiovascular;  Laterality: N/A;  . CAROTID ENDARTERECTOMY Left 03/2013  . CAROTID STENT INSERTION Left 03/20/2013   Procedure: CAROTID STENT INSERTION;  Surgeon: [name], MD;  Location: [facility] CATH LAB;  Service: Cardiovascular;  Laterality: Left;  internal carotid  . CEREBRAL ANGIOGRAM N/A 04/05/2011   Procedure: CEREBRAL ANGIOGRAM;  Surgeon: Angelia Mould, MD;  Location: [facility] CATH LAB;  Service: Cardiovascular;  Laterality: N/A;  . CHOLECYSTECTOMY OPEN  2004  . CORONARY ANGIOPLASTY WITH STENT PLACEMENT  01,02,05,06,07,08,11; 04/24/2013   "I've probably got ~ 10 stents by now" (04/24/2013)  . CORONARY ANGIOPLASTY WITH STENT PLACEMENT  06/13/2013   "got 4 stents today" (06/13/2013)  . CORONARY ARTERY BYPASS GRAFT  1220/11   "CABG X5"  . CORONARY STENT INTERVENTION N/A 10/20/2017   Procedure: CORONARY STENT INTERVENTION;  Surgeon: Troy Sine, MD;  Location: [place] [facility];  Service: Cardiovascular;  Laterality: N/A;  . ESOPHAGOGASTRODUODENOSCOPY  08/03/2011   Procedure: ESOPHAGOGASTRODUODENOSCOPY (EGD);  Surgeon: Shann Medal, MD;  Location: [facility];  Service: General;  Laterality: N/A;  . ESOPHAGOGASTRODUODENOSCOPY (EGD) WITH PROPOFOL N/A 03/11/2014   Procedure: ESOPHAGOGASTRODUODENOSCOPY (EGD) WITH PROPOFOL;  Surgeon: Lafayette Dragon, MD;   Location: [facility] ENDOSCOPY;  Service: Endoscopy;  Laterality: N/A;  . FRACTURE SURGERY    . gall stone removal  05/2003  . GASTRIC RESTRICTION SURGERY  1984   "stapeling"  . HERNIA REPAIR  2004   "in my stomach; had OR on it twice", wire mesh on 1 hernia  . LEFT HEART CATH AND CORS/GRAFTS ANGIOGRAPHY N/A 07/09/2016   Procedure: LEFT HEART CATH AND CORS/GRAFTS ANGIOGRAPHY;  Surgeon: Larey Dresser, MD;  Location: [place] [facility];  Service: Cardiovascular;  Laterality: N/A;  . ORIF ANKLE FRACTURE Left 05/16/2018  . ORIF ANKLE FRACTURE Left 05/16/2018   Procedure: OPEN REDUCTION INTERNAL FIXATION (ORIF) Left ankle with possible syndesmosis fixation;  Surgeon: Nicholes Stairs, MD;  Location: [place];  Service: Orthopedics;  Laterality: Left;  126min  . OVARY SURGERY  1970's   "tumor removed"  . PERCUTANEOUS CORONARY STENT INTERVENTION (PCI-S) N/A 06/13/2013   Procedure: PERCUTANEOUS CORONARY STENT INTERVENTION (PCI-S);  Surgeon: [name], MD;  Location: [facility] CATH LAB;  Service: Cardiovascular;  Laterality: N/A;  . PERCUTANEOUS STENT INTERVENTION N/A 04/24/2013   Procedure: PERCUTANEOUS STENT INTERVENTION;  Surgeon: [name], MD;  Location: [facility] CATH LAB;  Service: Cardiovascular;  Laterality: N/A;  . RIGHT/LEFT HEART CATH AND CORONARY/GRAFT ANGIOGRAPHY N/A 10/20/2017   Procedure: RIGHT/LEFT HEART CATH AND CORONARY/GRAFT ANGIOGRAPHY;  Surgeon: Larey Dresser, MD;  Location: [place] CV LAB;  Service: Cardiovascular;  Laterality: N/A;  . ROOT CANAL  10/2000  . TEE WITHOUT CARDIOVERSION N/A 01/18/2019   Procedure: TRANSESOPHAGEAL ECHOCARDIOGRAM (TEE);  Surgeon: Larey Dresser, MD;  Location: [facility] ENDOSCOPY;  Service: Cardiovascular;  Laterality: N/A;  . TIBIA FRACTURE SURGERY Right 1970's   rods and pins  . TOOTH EXTRACTION     "1 on the upper; wisdom tooth on the lower" (01/16/2018)  . TOTAL ABDOMINAL HYSTERECTOMY  1970's   w/ appendectomy    Vitals:   04/26/19 1405  04/26/19 1423  BP: 110/63   Pulse: 63 71  SpO2: 96% 94%    Subjective Assessment - 04/26/19 1402    Subjective  Not getting quite as winded when walking anymore. HEP is going well- only one she has trouble with is the bridge. Denies falls since last session.    Pertinent History  DM II, stroke 2014, 2015, 2016, PVD, supplemental O2 use, migraine, memory change, HTN, HLD, HA, hiatal hernia, GERD, peripheral neuropathy, depression, CAD, claustrophobia, CHF, L BBB, asthma, anxiety, anemia, L ankle ORIF 05/2018, CABG x5 2011    Diagnostic tests  none recent    Patient Stated Goals  "get to walking again and get control of my fatigue"    Currently in Pain?  Yes    Pain Score  4     Pain Location  Back  Pain Orientation  Right;Left;Lower    Pain Descriptors / Indicators  Aching   "catch"   Pain Type  Chronic pain                       [facility] Adult PT Treatment/Exercise - 04/26/19 0001      Neuro Re-ed    Neuro Re-ed Details   lateral stepping over line on floor x10 B UE support on counter, x10 1 UE support   cues for wider step and upright trunk     Knee/Hip Exercises: Aerobic   Nustep  Lvl 2, 6 min (LE/UE)      Knee/Hip Exercises: Standing   Knee Flexion  Right;Left;10 reps;Strengthening    Knee Flexion Limitations  yellow loop around ankles    Hip Flexion  Right;Left;10 reps;Knee bent;Stengthening;2 sets    Hip Flexion Limitations  yellow TB at counter    Terminal Knee Extension  Strengthening;Right;Left;1 set;10 reps    Terminal Knee Extension Limitations  R/L 10x3" into ball w/ walker support    Hip Extension  Right;Left;10 reps;Knee straight;Stengthening    Extension Limitations  at counter top             PT Education - 04/26/19 1714    Education Details  update to HEP    Person(s) Educated  Patient    Methods  Explanation;Demonstration;Tactile cues;Verbal cues;Handout    Comprehension  Verbalized understanding;Returned demonstration       PT  Short Term Goals - 04/19/19 1425      PT SHORT TERM GOAL #1   Title  Patient to be independent with initial HEP.    Time  3    Period  Weeks    Status  Achieved    Target Date  04/25/19        PT Long Term Goals - 04/23/19 1823      PT LONG TERM GOAL #1   Title  Patient to be independent with advanced HEP.    Time  8    Period  Weeks    Status  On-going      PT LONG TERM GOAL #2   Title  Patient to demonstrate B LE strength >/=4+/5.    Time  8    Period  Weeks    Status  On-going      PT LONG TERM GOAL #3   Title  Patient to score improve TUG score by 10 sec in order to decrease risk of falls.    Baseline  38.34 sec with RW    Time  8    Period  Weeks    Status  Achieved      PT LONG TERM GOAL #4   Title  Patient to score improve 5xSTS score by 10 sec without useing UEs in order to decrease risk of falls.    Baseline  28.29 sec pushing off from B armrests    Time  8    Period  Weeks    Status  On-going      PT LONG TERM GOAL #5   Title  Patient to report tolerance for 15 min of walking with LRAD without fatigue limiting.    Time  8    Period  Weeks    Status  On-going            Plan - 04/26/19 1715    Clinical Impression Statement  Patient without new complaints today. Upon questioning about possible causes of patient's previous  falls, patient described dizziness when changing positions quickly, thus educated patient on use of ankle pumps before transfers as patient may be orthostatic. Patient reported understanding. Increased resistance with standing march to address hip flexor strength. Patient required sitting rest breaks d/t fatigue between ther-ex. Vitals maintained stable. D/t c/o LBP with bridging, substituted this exercise with standing hip extension which was better tolerated. Worked on quad strength with standing TKE with walker support, which patient towered well. Patient demonstrated good focus and effort throughout today's session. Progressing well  towards goals.    Comorbidities  DM II, stroke 2014, 2015, 2016, PVD, supplemental O2 use, migraine, memory change, HTN, HLD, HA, hiatal hernia, GERD, peripheral neuropathy, depression, CAD, claustrophobia, CHF, L BBB, asthma, anxiety, anemia, L ankle ORIF 05/2018, CABG x5 2011    Rehab Potential  Good    PT Treatment/Interventions  ADLs/Self Care Home Management;Cryotherapy;Electrical Stimulation;Moist Heat;Balance training;Therapeutic exercise;Therapeutic activities;Functional mobility training;Stair training;Gait training;Ultrasound;Neuromuscular re-education;Patient/family education;Manual techniques;Taping;Energy conservation;Dry needling;Passive range of motion    PT Next Visit Plan  LE strengthening; gait training for improved RW safety    Consulted and Agree with Plan of Care  Patient       Patient will benefit from skilled therapeutic intervention in order to improve the following deficits and impairments:  Abnormal gait, Decreased endurance, Cardiopulmonary status limiting activity, Decreased activity tolerance, Decreased strength, Pain, Decreased balance, Decreased mobility, Difficulty walking, Improper body mechanics, Decreased range of motion, Impaired flexibility, Postural dysfunction  Visit Diagnosis: Muscle weakness (generalized)  Unsteadiness on feet  Other abnormalities of gait and mobility  Difficulty in walking, not elsewhere classified     Problem List Patient Active Problem List   Diagnosis Date Noted  . Symptomatic bradycardia 01/16/2019  . Normocytic anemia 01/16/2019  . Diabetic peripheral neuropathy ([place]) 01/12/2019  . Neurologic abnormality 12/25/2018  . Debility 09/21/2018  . Hypotension 09/18/2018  . Bradycardia 09/18/2018  . AKI (acute kidney injury) ([place]) 09/18/2018  . Pressure injury of skin 09/18/2018  . Ischemic cerebrovascular accident (CVA) of frontal lobe ([place]) 09/13/2018  . Fall 09/09/2018  . History of CVA (cerebrovascular accident)  09/09/2018  . Type II diabetes mellitus with renal manifestations ([place]) 09/09/2018  . CKD (chronic kidney disease), stage III 09/09/2018  . Sepsis ([place]) 09/09/2018  . Depression with anxiety 09/09/2018  . Slurred speech 09/09/2018  . Ankle fracture, left 05/16/2018  . Amiodarone pulmonary toxicity   . Physical deconditioning   . 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 Acute on chronic congestive heart failure ([place])   . Chronic combined systolic (congestive) and diastolic (congestive) heart failure ([place]) 01/16/2018  . Chronic systolic CHF (congestive heart failure) ([place]) 01/16/2018  . Atrial fibrillation ([place]) 10/21/2017  . CAD (coronary artery disease) of bypass graft 10/20/2017  . Coronary artery disease of bypass graft of native heart with stable angina pectoris ([place])   . Chronic low back pain 08/24/2016  . Pain of left thumb 08/05/2016  . Nocturnal hypoxia 06/02/2016  . Hoarseness 04/05/2016  . Laryngopharyngeal reflux (LPR) 04/05/2016  . Pharyngoesophageal dysphagia 04/05/2016  . Angina decubitus ([place]) 05/22/2015  . Chronic insomnia 05/06/2015  . Common migraine 05/14/2014  . Abnormality of gait 05/14/2014  . Memory change 05/14/2014  . Aneurysm, cerebral, nonruptured 05/14/2014  . Cramp of limb-Left neck 05/10/2014  . Hematemesis with nausea   . Vomiting blood   . Dizziness and giddiness 09/21/2013  . Atypical chest pain 07/18/2013  . Unstable angina ([place]) 04/09/2013  . Carotid artery stenosis, symptomatic 03/20/2013  .  Cerebrovascular disease 07/10/2012  . Cerebral artery occlusion with cerebral infarction ([place]) 07/10/2012  . Mitral regurgitation 04/15/2012  . TIA (transient ischemic attack) 04/15/2012  . Occlusion and stenosis of carotid artery without mention of cerebral infarction 08/18/2011  . Bariatric surgery status 06/17/2011  . Speech abnormality 03/22/2011  . Dyspnea 02/24/2011  . PAPILLARY MUSCLE DYSFUNCTION, NON-RHEUMATIC 10/09/2008  . UNSPECIFIED VITAMIN D  DEFICIENCY 10/24/2007  . MYOCARDIAL INFARCTION, HX OF 10/24/2007  . PERSISTENT VOMITING 10/24/2007  . OSTEOARTHRITIS 10/24/2007  . MIGRAINES, HX OF 10/24/2007  . Diabetes mellitus type 2, controlled, with complications ([place]) 12/81/1886  . Hyperlipemia 01/16/2007  . Obesity-post failed open gastroplasty 1984  01/16/2007  . OBSTRUCTIVE SLEEP APNEA 01/16/2007  . Essential hypertension 01/16/2007  . Coronary atherosclerosis 01/16/2007  . Asthma 01/16/2007  . GERD 01/16/2007  . VENTRAL HERNIA 01/16/2007     Janene Harvey, PT, DPT 04/26/19 5:18 PM   [facility] [address]  [place], [place], [address] Phone: [phone]   Fax:  [phone]  Name: [name] [name] [name]: [id number] Date of Birth: [birth year]

## 2019-04-30 ENCOUNTER — Other Ambulatory Visit: Payer: Self-pay

## 2019-04-30 ENCOUNTER — Telehealth: Payer: Self-pay | Admitting: Physical Therapy

## 2019-04-30 ENCOUNTER — Ambulatory Visit: Payer: Medicare Other

## 2019-04-30 VITALS — BP 142/64 | HR 64

## 2019-04-30 DIAGNOSIS — R2689 Other abnormalities of gait and mobility: Secondary | ICD-10-CM

## 2019-04-30 DIAGNOSIS — R2681 Unsteadiness on feet: Secondary | ICD-10-CM

## 2019-04-30 DIAGNOSIS — R262 Difficulty in walking, not elsewhere classified: Secondary | ICD-10-CM

## 2019-04-30 DIAGNOSIS — M6281 Muscle weakness (generalized): Secondary | ICD-10-CM | POA: Diagnosis not present

## 2019-04-30 NOTE — Therapy (Signed)
[facility] [address]  [place], [place], [address] Phone: [phone]   Fax:  [phone]  Physical Therapy Treatment  Patient Details  Name: [name] [name] [name]: [id number] Date of Birth: [birth year] Referring Provider (PT): Loralie Champagne, MD   Encounter Date: 04/30/2019  PT End of Session - 04/30/19 1500    Visit Number  8    Number of Visits  17    Date for PT Re-Evaluation  05/30/19    Authorization Type  Medicare & AARP    PT Start Time  1740    PT Stop Time  1530    PT Time Calculation (min)  38 min    Activity Tolerance  Patient tolerated treatment well;Patient limited by fatigue    Behavior During Therapy  [facility] for tasks assessed/performed       Past Medical History:  Diagnosis Date  . Anemia    hx  . Anxiety   . Asthma   . Basal cell carcinoma 05/2014   "left shoulder"  . Bundle branch block, left    chronic/notes 07/18/2013  . CHF (congestive heart failure) ([place])   . Chronic insomnia 05/06/2015  . Chronic kidney disease    frequency, sees dr Jamal Maes every 4 to 6 months (01/16/2018)  . Chronic lower back pain   . Claustrophobia   . Common migraine 05/14/2014  . Coronary artery disease    MI in 2001, 2002, 2006, 2011, 2014  . Depression   . Diabetic peripheral neuropathy ([place]) 01/12/2019  . GERD (gastroesophageal reflux disease)   . H/O hiatal hernia   . Headache    "at least 2/month" (01/16/2018)  . Heart murmur   . Hyperlipidemia   . Hypertension   . Memory change 05/14/2014  . Migraine    "5-6/year"  (01/16/2018)  . Obesity 01-2010  . Obstructive sleep apnea    "can't wear machine; I have claustrophobia" (01/16/2018), states she had a 2nd sleep study and does not have sleep apnea, her O2 decreases and now is on 2 L of O2 at night.  . On home oxygen therapy    "2L at night and prn during daytime" (01/16/2018)  . Osteoarthritis    "knees and hands" (01/16/2018)  . Peripheral  vascular disease (HCC)    ? numbness, tingling arms and legs  . PONV (postoperative nausea and vomiting)   . Stroke [facility])    2014, 2015, 2016  . Type II diabetes mellitus ([place])   . Ventral hernia    hx of    Past Surgical History:  Procedure Laterality Date  . ANKLE FRACTURE SURGERY Left 1970's  . APPENDECTOMY  1970's   w/hysterectomy  . BASAL CELL CARCINOMA EXCISION Left 05/2014   "shoulder" (01/16/2018)  . CARDIAC CATHETERIZATION  10/10/2012   Dr [name].  Marland Kitchen CARDIAC CATHETERIZATION N/A 05/29/2015   Procedure: Right/Left Heart Cath and Coronary/Graft Angiography;  Surgeon: Larey Dresser, MD;  Location: [place] CV LAB;  Service: Cardiovascular;  Laterality: N/A;  . CAROTID ENDARTERECTOMY Left 03/2013  . CAROTID STENT INSERTION Left 03/20/2013   Procedure: CAROTID STENT INSERTION;  Surgeon: [name], MD;  Location: [facility] CATH LAB;  Service: Cardiovascular;  Laterality: Left;  internal carotid  . CEREBRAL ANGIOGRAM N/A 04/05/2011   Procedure: CEREBRAL ANGIOGRAM;  Surgeon: Angelia Mould, MD;  Location: [facility] CATH LAB;  Service: Cardiovascular;  Laterality: N/A;  . CHOLECYSTECTOMY OPEN  2004  . CORONARY ANGIOPLASTY WITH STENT PLACEMENT  01,02,05,06,07,08,11; 04/24/2013   "I've probably got ~ 10 stents by now" (04/24/2013)  . CORONARY ANGIOPLASTY WITH STENT PLACEMENT  06/13/2013   "got 4 stents today" (06/13/2013)  . CORONARY ARTERY BYPASS GRAFT  1220/11   "CABG X5"  . CORONARY STENT INTERVENTION N/A 10/20/2017   Procedure: CORONARY STENT INTERVENTION;  Surgeon: Troy Sine, MD;  Location: [place] [facility] LAB;  Service: Cardiovascular;  Laterality: N/A;  . ESOPHAGOGASTRODUODENOSCOPY  08/03/2011   Procedure: ESOPHAGOGASTRODUODENOSCOPY (EGD);  Surgeon: Shann Medal, MD;  Location: [facility];  Service: General;  Laterality: N/A;  . ESOPHAGOGASTRODUODENOSCOPY (EGD) WITH PROPOFOL N/A 03/11/2014   Procedure: ESOPHAGOGASTRODUODENOSCOPY (EGD) WITH PROPOFOL;  Surgeon: Lafayette Dragon, MD;   Location: [facility] ENDOSCOPY;  Service: Endoscopy;  Laterality: N/A;  . FRACTURE SURGERY    . gall stone removal  05/2003  . GASTRIC RESTRICTION SURGERY  1984   "stapeling"  . HERNIA REPAIR  2004   "in my stomach; had OR on it twice", wire mesh on 1 hernia  . LEFT HEART CATH AND CORS/GRAFTS ANGIOGRAPHY N/A 07/09/2016   Procedure: LEFT HEART CATH AND CORS/GRAFTS ANGIOGRAPHY;  Surgeon: Larey Dresser, MD;  Location: [place] [facility] LAB;  Service: Cardiovascular;  Laterality: N/A;  . ORIF ANKLE FRACTURE Left 05/16/2018  . ORIF ANKLE FRACTURE Left 05/16/2018   Procedure: OPEN REDUCTION INTERNAL FIXATION (ORIF) Left ankle with possible syndesmosis fixation;  Surgeon: Nicholes Stairs, MD;  Location: [facility];  Service: Orthopedics;  Laterality: Left;  162min  . OVARY SURGERY  1970's   "tumor removed"  . PERCUTANEOUS CORONARY STENT INTERVENTION (PCI-S) N/A 06/13/2013   Procedure: PERCUTANEOUS CORONARY STENT INTERVENTION (PCI-S);  Surgeon: [name], MD;  Location: [facility] CATH LAB;  Service: Cardiovascular;  Laterality: N/A;  . PERCUTANEOUS STENT INTERVENTION N/A 04/24/2013   Procedure: PERCUTANEOUS STENT INTERVENTION;  Surgeon: [name], MD;  Location: [facility] CATH LAB;  Service: Cardiovascular;  Laterality: N/A;  . RIGHT/LEFT HEART CATH AND CORONARY/GRAFT ANGIOGRAPHY N/A 10/20/2017   Procedure: RIGHT/LEFT HEART CATH AND CORONARY/GRAFT ANGIOGRAPHY;  Surgeon: Larey Dresser, MD;  Location: [facility] [facility] LAB;  Service: Cardiovascular;  Laterality: N/A;  . ROOT CANAL  10/2000  . TEE WITHOUT CARDIOVERSION N/A 01/18/2019   Procedure: TRANSESOPHAGEAL ECHOCARDIOGRAM (TEE);  Surgeon: Larey Dresser, MD;  Location: [facility] ENDOSCOPY;  Service: Cardiovascular;  Laterality: N/A;  . TIBIA FRACTURE SURGERY Right 1970's   rods and pins  . TOOTH EXTRACTION     "1 on the upper; wisdom tooth on the lower" (01/16/2018)  . TOTAL ABDOMINAL HYSTERECTOMY  1970's   w/ appendectomy    Vitals:   04/30/19 1457   BP: (!) 142/64  Pulse: 64  SpO2: 95%    Subjective Assessment - 04/30/19 1457    Subjective  Pt. reporting she had a fall yesterday on hard floor while trying to put on pants and in RW.  Landed on R LE/UE and impacted head.  Husband had to help pt. up from floor in bathroom.    Pertinent History  DM II, stroke 2014, 2015, 2016, PVD, supplemental O2 use, migraine, memory change, HTN, HLD, HA, hiatal hernia, GERD, peripheral neuropathy, depression, CAD, claustrophobia, CHF, L BBB, asthma, anxiety, anemia, L ankle ORIF 05/2018, CABG x5 2011    Diagnostic tests  none recent    Patient Stated Goals  "get to walking again and get control of my fatigue"    Currently in Pain?  Yes    Pain Score  6  Pain Location  Back    Pain Orientation  Right;Left;Lower    Pain Descriptors / Indicators  Aching    Pain Type  Chronic pain    Multiple Pain Sites  Yes    Pain Score  5    Pain Location  Shoulder    Pain Orientation  Right    Pain Descriptors / Indicators  Aching    Pain Type  Acute pain    Pain Radiating Towards  radiating into R upper shoulder and head    Pain Onset  Yesterday    Aggravating Factors   Falling into bathroom yesterday                       [facility] Adult PT Treatment/Exercise - 04/30/19 0001      Transfers   Transfers  Sit to Stand;Stand to Sit    Sit to Stand  5: Supervision    Stand to Sit  5: Supervision    Stand to Sit Details (indicate cue type and reason)  Tactile cues for sequencing    Stand to Sit Details  cues for proper hand placement and RW from BOS with stand to sit       Self-Care   Self-Care  Other Self-Care Comments    Other Self-Care Comments   Instructed pt. in need for being seated for dressing to reduce chance of falls and need for having B UE support on RW when standing for reduced change of falls       Knee/Hip Exercises: Aerobic   Nustep  Lvl 2, 6 min (LE/UE); break taken at 3 min for HR check - WFL      Knee/Hip Exercises:  Seated   Long Arc Quad  Right;Left;10 reps;Strengthening    Long Arc Quad Weight  --   reduce resistance due to recent fall with R LE irritation    Long Arc Quad Limitations  focusing on TKE    Other Seated Knee/Hip Exercises  seated toe raise x 20 reps     Other Seated Knee/Hip Exercises  seated heel raise x 20 reps     Sit to Sand  1 set;5 reps   focusing on proper hand placement to RW              PT Short Term Goals - 04/19/19 1425      PT SHORT TERM GOAL #1   Title  Patient to be independent with initial HEP.    Time  3    Period  Weeks    Status  Achieved    Target Date  04/25/19        PT Long Term Goals - 04/23/19 1823      PT LONG TERM GOAL #1   Title  Patient to be independent with advanced HEP.    Time  8    Period  Weeks    Status  On-going      PT LONG TERM GOAL #2   Title  Patient to demonstrate B LE strength >/=4+/5.    Time  8    Period  Weeks    Status  On-going      PT LONG TERM GOAL #3   Title  Patient to score improve TUG score by 10 sec in order to decrease risk of falls.    Baseline  38.34 sec with RW    Time  8    Period  Weeks    Status  Achieved  PT LONG TERM GOAL #4   Title  Patient to score improve 5xSTS score by 10 sec without useing UEs in order to decrease risk of falls.    Baseline  28.29 sec pushing off from B armrests    Time  8    Period  Weeks    Status  On-going      PT LONG TERM GOAL #5   Title  Patient to report tolerance for 15 min of walking with LRAD without fatigue limiting.    Time  8    Period  Weeks    Status  On-going            Plan - 04/30/19 1510    Clinical Impression Statement Pt. reporting she had a fall while trying to stand and put on her pants yesterday with one hand on the RW.  She fell and landed on her R UE/LE/shoulder and hit the side of her head on the bathroom floor.  Pt. reporting husband had to help her up.  Notes R-sided bruising which extends up R side into R shoulder and  neck/head.  Denies HA, photosensitivity, difficulty concentrating, nausea/vomiting. Supervising PT with visual inspection of pt. R-sided head with no visible bruising.  Supervising PT reached out to MD office while pt. here today to inform of pt. fall.  Pt. encouraged to reach out to MD if pain not improving over next few days.  Session focused on gentle ROM and LE strengthening to tolerance without increased pain.  Vitals remained WFL throughout session.     Comorbidities  DM II, stroke 2014, 2015, 2016, PVD, supplemental O2 use, migraine, memory change, HTN, HLD, HA, hiatal hernia, GERD, peripheral neuropathy, depression, CAD, claustrophobia, CHF, L BBB, asthma, anxiety, anemia, L ankle ORIF 05/2018, CABG x5 2011    Rehab Potential  Good    PT Treatment/Interventions  ADLs/Self Care Home Management;Cryotherapy;Electrical Stimulation;Moist Heat;Balance training;Therapeutic exercise;Therapeutic activities;Functional mobility training;Stair training;Gait training;Ultrasound;Neuromuscular re-education;Patient/family education;Manual techniques;Taping;Energy conservation;Dry needling;Passive range of motion    PT Next Visit Plan  LE strengthening; gait training for improved RW safety    Consulted and Agree with Plan of Care  Patient       Patient will benefit from skilled therapeutic intervention in order to improve the following deficits and impairments:  Abnormal gait, Decreased endurance, Cardiopulmonary status limiting activity, Decreased activity tolerance, Decreased strength, Pain, Decreased balance, Decreased mobility, Difficulty walking, Improper body mechanics, Decreased range of motion, Impaired flexibility, Postural dysfunction  Visit Diagnosis: Muscle weakness (generalized)  Unsteadiness on feet  Other abnormalities of gait and mobility  Difficulty in walking, not elsewhere classified     Problem List Patient Active Problem List   Diagnosis Date Noted  . Symptomatic bradycardia  01/16/2019  . Normocytic anemia 01/16/2019  . Diabetic peripheral neuropathy ([place]) 01/12/2019  . Neurologic abnormality 12/25/2018  . Debility 09/21/2018  . Hypotension 09/18/2018  . Bradycardia 09/18/2018  . AKI (acute kidney injury) ([place]) 09/18/2018  . Pressure injury of skin 09/18/2018  . Ischemic cerebrovascular accident (CVA) of frontal lobe ([place]) 09/13/2018  . Fall 09/09/2018  . History of CVA (cerebrovascular accident) 09/09/2018  . Type II diabetes mellitus with renal manifestations ([place]) 09/09/2018  . CKD (chronic kidney disease), stage III 09/09/2018  . Sepsis ([place]) 09/09/2018  . Depression with anxiety 09/09/2018  . Slurred speech 09/09/2018  . Ankle fracture, left 05/16/2018  . Amiodarone pulmonary toxicity   . Physical deconditioning   . 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 Acute on chronic  congestive heart failure ([place])   . Chronic combined systolic (congestive) and diastolic (congestive) heart failure ([place]) 01/16/2018  . Chronic systolic CHF (congestive heart failure) ([place]) 01/16/2018  . Atrial fibrillation ([place]) 10/21/2017  . CAD (coronary artery disease) of bypass graft 10/20/2017  . Coronary artery disease of bypass graft of native heart with stable angina pectoris ([place])   . Chronic low back pain 08/24/2016  . Pain of left thumb 08/05/2016  . Nocturnal hypoxia 06/02/2016  . Hoarseness 04/05/2016  . Laryngopharyngeal reflux (LPR) 04/05/2016  . Pharyngoesophageal dysphagia 04/05/2016  . Angina decubitus ([place]) 05/22/2015  . Chronic insomnia 05/06/2015  . Common migraine 05/14/2014  . Abnormality of gait 05/14/2014  . Memory change 05/14/2014  . Aneurysm, cerebral, nonruptured 05/14/2014  . Cramp of limb-Left neck 05/10/2014  . Hematemesis with nausea   . Vomiting blood   . Dizziness and giddiness 09/21/2013  . Atypical chest pain 07/18/2013  . Unstable angina ([place]) 04/09/2013  . Carotid artery stenosis, symptomatic 03/20/2013  . Cerebrovascular disease  07/10/2012  . Cerebral artery occlusion with cerebral infarction ([place]) 07/10/2012  . Mitral regurgitation 04/15/2012  . TIA (transient ischemic attack) 04/15/2012  . Occlusion and stenosis of carotid artery without mention of cerebral infarction 08/18/2011  . Bariatric surgery status 06/17/2011  . Speech abnormality 03/22/2011  . Dyspnea 02/24/2011  . PAPILLARY MUSCLE DYSFUNCTION, NON-RHEUMATIC 10/09/2008  . UNSPECIFIED VITAMIN D DEFICIENCY 10/24/2007  . MYOCARDIAL INFARCTION, HX OF 10/24/2007  . PERSISTENT VOMITING 10/24/2007  . OSTEOARTHRITIS 10/24/2007  . MIGRAINES, HX OF 10/24/2007  . Diabetes mellitus type 2, controlled, with complications ([place]) [id number]/7373  . Hyperlipemia 01/16/2007  . Obesity-post failed open gastroplasty 1984  01/16/2007  . OBSTRUCTIVE SLEEP APNEA 01/16/2007  . Essential hypertension 01/16/2007  . Coronary atherosclerosis 01/16/2007  . Asthma 01/16/2007  . GERD 01/16/2007  . VENTRAL HERNIA 01/16/2007    Bess Harvest, PTA 04/30/19 6:24 PM   [facility] [address]  [place], [place], [address] Phone: [phone]   Fax:  [phone]  Name: [name] [name] [name]: [id number] Date of Birth: 06-06-1945

## 2019-04-30 NOTE — Telephone Encounter (Signed)
Contacted [name] [name]'s office- patient's PCP at 3:20 pm as patient today reporting that she sustained a fall hitting the R side of her head and body. Patient with HA but no visible bruising over R basilar skull- asked if further workup was necessary.

## 2019-05-03 ENCOUNTER — Other Ambulatory Visit: Payer: Self-pay | Admitting: Neurology

## 2019-05-03 ENCOUNTER — Encounter: Payer: Self-pay | Admitting: Physical Therapy

## 2019-05-03 ENCOUNTER — Other Ambulatory Visit (HOSPITAL_COMMUNITY): Payer: Self-pay | Admitting: Cardiology

## 2019-05-03 ENCOUNTER — Ambulatory Visit: Payer: Medicare Other | Attending: Cardiology | Admitting: Physical Therapy

## 2019-05-03 ENCOUNTER — Other Ambulatory Visit: Payer: Self-pay

## 2019-05-03 VITALS — BP 125/68 | HR 65

## 2019-05-03 DIAGNOSIS — R2681 Unsteadiness on feet: Secondary | ICD-10-CM | POA: Insufficient documentation

## 2019-05-03 DIAGNOSIS — R2689 Other abnormalities of gait and mobility: Secondary | ICD-10-CM | POA: Diagnosis present

## 2019-05-03 DIAGNOSIS — M6281 Muscle weakness (generalized): Secondary | ICD-10-CM | POA: Insufficient documentation

## 2019-05-03 DIAGNOSIS — R262 Difficulty in walking, not elsewhere classified: Secondary | ICD-10-CM | POA: Insufficient documentation

## 2019-05-03 NOTE — Therapy (Signed)
[facility] [place] [address]  [address] [place], [place], [address] Phone: [phone]   Fax:  [phone]  Physical Therapy Progress Note  Patient Details  Name: [name] [name] [name]: [id number] Date of Birth: 02/13/1945 Referring Provider (PT): Dalton McLean, MD   Progress Note Reporting Period 04/04/19 to 05/03/19  See note below for Objective Data and Assessment of Progress/Goals.     Encounter Date: 05/03/2019  PT End of Session - 05/03/19 1608    Visit Number  9    Number of Visits  17    Date for PT Re-Evaluation  05/30/19    Authorization Type  Medicare & AARP    PT Start Time  1402    PT Stop Time  1444    PT Time Calculation (min)  42 min    Activity Tolerance  Patient tolerated treatment well;Patient limited by fatigue    Behavior During Therapy  WFL for tasks assessed/performed       Past Medical History:  Diagnosis Date  . Anemia    hx  . Anxiety   . Asthma   . Basal cell carcinoma 05/2014   "left shoulder"  . Bundle branch block, left    chronic/notes 07/18/2013  . CHF (congestive heart failure) (HCC)   . Chronic insomnia 05/06/2015  . Chronic kidney disease    frequency, sees dr cynthia dunham every 4 to 6 months (01/16/2018)  . Chronic lower back pain   . Claustrophobia   . Common migraine 05/14/2014  . Coronary artery disease    MI in 2001, 2002, 2006, 2011, 2014  . Depression   . Diabetic peripheral neuropathy (HCC) 01/12/2019  . GERD (gastroesophageal reflux disease)   . H/O hiatal hernia   . Headache    "at least 2/month" (01/16/2018)  . Heart murmur   . Hyperlipidemia   . Hypertension   . Memory change 05/14/2014  . Migraine    "5-6/year"  (01/16/2018)  . Obesity 01-2010  . Obstructive sleep apnea    "can't wear machine; I have claustrophobia" (01/16/2018), states she had a 2nd sleep study and does not have sleep apnea, her O2 decreases and now is on 2 L of O2 at night.  . On home  oxygen therapy    "2L at night and prn during daytime" (01/16/2018)  . Osteoarthritis    "knees and hands" (01/16/2018)  . Peripheral vascular disease (HCC)    ? numbness, tingling arms and legs  . PONV (postoperative nausea and vomiting)   . Stroke (HCC)    2014, 2015, 2016  . Type II diabetes mellitus (HCC)   . Ventral hernia    hx of    Past Surgical History:  Procedure Laterality Date  . ANKLE FRACTURE SURGERY Left 1970's  . APPENDECTOMY  1970's   w/hysterectomy  . BASAL CELL CARCINOMA EXCISION Left 05/2014   "shoulder" (01/16/2018)  . CARDIAC CATHETERIZATION  10/10/2012   Dr McLean.  . CARDIAC CATHETERIZATION N/A 05/29/2015   Procedure: Right/Left Heart Cath and Coronary/Graft Angiography;  Surgeon: Dalton S McLean, MD;  Location: [facility];  Service: Cardiovascular;  Laterality: N/A;  . CAROTID ENDARTERECTOMY Left 03/2013  . CAROTID STENT INSERTION Left 03/20/2013   Procedure: CAROTID STENT INSERTION;  Surgeon: Vance W Brabham, MD;  Location: [facility];  Service: Cardiovascular;  Laterality: Left;  internal carotid  . CEREBRAL ANGIOGRAM N/A 04/05/2011   Procedure: CEREBRAL ANGIOGRAM;  Surgeon: Christopher S Dickson, MD;    Location: [facility];  Service: Cardiovascular;  Laterality: N/A;  . CHOLECYSTECTOMY OPEN  2004  . CORONARY ANGIOPLASTY WITH STENT PLACEMENT  01,02,05,06,07,08,11; 04/24/2013   "I've probably got ~ 10 stents by now" (04/24/2013)  . CORONARY ANGIOPLASTY WITH STENT PLACEMENT  06/13/2013   "got 4 stents today" (06/13/2013)  . CORONARY ARTERY BYPASS GRAFT  1220/11   "CABG X5"  . CORONARY STENT INTERVENTION N/A 10/20/2017   Procedure: CORONARY STENT INTERVENTION;  Surgeon: Kelly, Thomas A, MD;  [facility]: [facility];  Service: Cardiovascular;  Laterality: N/A;  . ESOPHAGOGASTRODUODENOSCOPY  08/03/2011   Procedure: ESOPHAGOGASTRODUODENOSCOPY (EGD);  Surgeon: David H Newman, MD;  Location: [facility];  Service: General;  Laterality: N/A;  .  ESOPHAGOGASTRODUODENOSCOPY (EGD) WITH PROPOFOL N/A 03/11/2014   Procedure: ESOPHAGOGASTRODUODENOSCOPY (EGD) WITH PROPOFOL;  Surgeon: Dora M Brodie, MD;  Location: [facility];  Service: Endoscopy;  Laterality: N/A;  . FRACTURE SURGERY    . gall stone removal  05/2003  . GASTRIC RESTRICTION SURGERY  1984   "stapeling"  . HERNIA REPAIR  2004   "in my stomach; had OR on it twice", wire mesh on 1 hernia  . LEFT HEART CATH AND CORS/GRAFTS ANGIOGRAPHY N/A 07/09/2016   Procedure: LEFT HEART CATH AND CORS/GRAFTS ANGIOGRAPHY;  Surgeon: McLean, Dalton S, MD;  [facility];  Service: Cardiovascular;  Laterality: N/A;  . ORIF ANKLE FRACTURE Left 05/16/2018  . ORIF ANKLE FRACTURE Left 05/16/2018   Procedure: OPEN REDUCTION INTERNAL FIXATION (ORIF) Left ankle with possible syndesmosis fixation;  Surgeon: Rogers, Jason Patrick, MD;  Location: [facility];  Service: Orthopedics;  Laterality: Left;  120min  . OVARY SURGERY  1970's   "tumor removed"  . PERCUTANEOUS CORONARY STENT INTERVENTION (PCI-S) N/A 06/13/2013   Procedure: PERCUTANEOUS CORONARY STENT INTERVENTION (PCI-S);  Surgeon: Jayadeep S Varanasi, MD;  [facility]: [facility];  Service: Cardiovascular;  Laterality: N/A;  . PERCUTANEOUS STENT INTERVENTION N/A 04/24/2013   Procedure: PERCUTANEOUS STENT INTERVENTION;  Surgeon: Jayadeep S Varanasi, MD;  [facility]: [facility];  Service: Cardiovascular;  Laterality: N/A;  . RIGHT/LEFT HEART CATH AND CORONARY/GRAFT ANGIOGRAPHY N/A 10/20/2017   Procedure: RIGHT/LEFT HEART CATH AND CORONARY/GRAFT ANGIOGRAPHY;  Surgeon: McLean, Dalton S, MD;  [facility];  Service: Cardiovascular;  Laterality: N/A;  . ROOT CANAL  10/2000  . TEE WITHOUT CARDIOVERSION N/A 01/18/2019   Procedure: TRANSESOPHAGEAL ECHOCARDIOGRAM (TEE);  Surgeon: McLean, Dalton S, MD;  Location: [facility];  Service: Cardiovascular;  Laterality: N/A;  . TIBIA FRACTURE SURGERY Right 1970's   rods and pins  . TOOTH EXTRACTION      "1 on the upper; wisdom tooth on the lower" (01/16/2018)  . TOTAL ABDOMINAL HYSTERECTOMY  1970's   w/ appendectomy    Vitals:   05/03/19 1403  BP: 125/68  Pulse: 65  SpO2: 90%    Subjective Assessment - 05/03/19 1407    Subjective  Patient reports that she was contacted by her PCP's office about her fall. No CT scan recommended d/t no worsening of her symptoms. Having some achiness in her body since her fall. Had a few more bruises appear since her fall. Has been slightly more dizzy and off balance since her fall but this is getting better.    Pertinent History  DM II, stroke 2014, 2015, 2016, PVD, supplemental O2 use, migraine, memory change, HTN, HLD, HA, hiatal hernia, GERD, peripheral neuropathy, depression, CAD, claustrophobia, CHF, L BBB, asthma, anxiety, anemia, L ankle ORIF 05/2018, CABG x5 2011      Diagnostic tests  none recent    Patient Stated Goals  "get to walking again and get control of my fatigue"    Currently in Pain?  Yes    Pain Score  5     Pain Location  Back    Pain Orientation  Right;Left;Lower    Pain Descriptors / Indicators  Aching    Pain Type  Chronic pain    Pain Radiating Towards  down the R LE         [facility] PT Assessment - 05/03/19 0001      Strength   Right Hip Flexion  4/5    Right Hip ABduction  4+/5    Right Hip ADduction  4+/5    Left Hip Flexion  4/5    Left Hip ABduction  4+/5    Left Hip ADduction  4+/5    Right Knee Flexion  4/5    Right Knee Extension  4+/5    Left Knee Flexion  4+/5    Left Knee Extension  4/5    Right Ankle Dorsiflexion  4+/5    Right Ankle Plantar Flexion  4+/5    Left Ankle Dorsiflexion  4+/5    Left Ankle Plantar Flexion  4+/5      Standardized Balance Assessment   Five times sit to stand comments   17.44 w/ B armrests   14.29 pushing off from B LEs     Timed Up and Go Test   Normal TUG (seconds)  23.08                   [facility] Adult PT Treatment/Exercise - 05/03/19 0001      Knee/Hip  Exercises: Aerobic   Nustep  Lvl 3, 6 min (LE/UE)      Knee/Hip Exercises: Seated   Long Arc Quad  Right;Left;10 reps;Strengthening    Long Arc Quad Limitations  red loop around ankles    Marching  Strengthening;1 set;Both;20 reps    Marching Limitations  with yellow TB around toes    Hamstring Curl  Strengthening;Right;Left;1 set;10 reps    Hamstring Limitations  green TB             PT Education - 05/03/19 1607    Education Details  update to HEP; discussion on objective progress and remaining impairments    Person(s) Educated  Patient    Methods  Explanation;Demonstration;Verbal cues;Tactile cues;Handout    Comprehension  Verbalized understanding;Returned demonstration       PT Short Term Goals - 05/03/19 1409      PT SHORT TERM GOAL #1   Title  Patient to be independent with initial HEP.    Time  3    Period  Weeks    Status  Achieved    Target Date  04/25/19        PT Long Term Goals - 05/03/19 1409      PT LONG TERM GOAL #1   Title  Patient to be independent with advanced HEP.    Time  8    Period  Weeks    Status  Partially Met   recently limited by dizziness     PT LONG TERM GOAL #2   Title  Patient to demonstrate B LE strength >/=4+/5.    Time  8    Period  Weeks    Status  Partially Met   improvement in B hip abduction and adduction, L hip flexion, L knee flexion, and L ankle dorsiflexion strength       PT LONG TERM GOAL #3   Title  Patient to score <14 sec on TUG testing in order to decrease risk of falls.    Baseline  38.34 sec with RW    Time  8    Period  Weeks    Status  Partially Met   05/03/19: 23.08 sec     PT LONG TERM GOAL #4   Title  Patient to score <12 sec on 5xSTS in order to decrease risk of falls.    Baseline  28.29 sec pushing off from B armrests    Time  8    Period  Weeks    Status  Partially Met   05/03/19: 14.29 sec pushing off from LEs     PT LONG TERM GOAL #5   Title  Patient to report tolerance for 15 min of  walking with LRAD without fatigue limiting.    Time  8    Period  Weeks    Status  On-going   reports limited to 5-6 minutes d/t SOB and LE buckling           Plan - 05/03/19 1608    Clinical Impression Statement  Patient reports that she was contacted by her PCP's office about her fall and imaging was not necessary. Does report increased dizziness and imbalance since her fall, but this is improving. Denies photo/phonophobia. Patient has demonstrated improvement in B hip abduction and adduction, L hip flexion, L knee flexion, and L ankle dorsiflexion strength. TUG score has improved since initial eval, but slightly worse from last measured- patient notes that she is slightly more fatigued today. Scored 14.29 seconds with 5xSTS today which is much improved since last measurement. Notes that she is still limited in her walking tolerance d/t fatigue. Worked on seated LE strengthening to continue addressing patient's strength limitations. Updated HEP with exercises that were well-tolerated today. Patient reported understanding. Patient is demonstrating good progress with therapy, with recent fall giving her a very slight set back. Would continue to benefit form skilled PT services to address remaining goals.    Comorbidities  DM II, stroke 2014, 2015, 2016, PVD, supplemental O2 use, migraine, memory change, HTN, HLD, HA, hiatal hernia, GERD, peripheral neuropathy, depression, CAD, claustrophobia, CHF, L BBB, asthma, anxiety, anemia, L ankle ORIF 05/2018, CABG x5 2011    Rehab Potential  Good    PT Treatment/Interventions  ADLs/Self Care Home Management;Cryotherapy;Electrical Stimulation;Moist Heat;Balance training;Therapeutic exercise;Therapeutic activities;Functional mobility training;Stair training;Gait training;Ultrasound;Neuromuscular re-education;Patient/family education;Manual techniques;Taping;Energy conservation;Dry needling;Passive range of motion    PT Next Visit Plan  LE strengthening; gait  training for improved RW safety    Consulted and Agree with Plan of Care  Patient       Patient will benefit from skilled therapeutic intervention in order to improve the following deficits and impairments:  Abnormal gait, Decreased endurance, Cardiopulmonary status limiting activity, Decreased activity tolerance, Decreased strength, Pain, Decreased balance, Decreased mobility, Difficulty walking, Improper body mechanics, Decreased range of motion, Impaired flexibility, Postural dysfunction  Visit Diagnosis: Muscle weakness (generalized)  Unsteadiness on feet  Other abnormalities of gait and mobility  Difficulty in walking, not elsewhere classified     Problem List Patient Active Problem List   Diagnosis Date Noted  . Symptomatic bradycardia 01/16/2019  . Normocytic anemia 01/16/2019  . Diabetic peripheral neuropathy ([place]) 01/12/2019  . Neurologic abnormality 12/25/2018  . Debility 09/21/2018  . Hypotension 09/18/2018  . Bradycardia 09/18/2018  . AKI (acute kidney injury) ([place]) 09/18/2018  . Pressure injury of  skin 09/18/2018  . Ischemic cerebrovascular accident (CVA) of frontal lobe (HCC) 09/13/2018  . Fall 09/09/2018  . History of CVA (cerebrovascular accident) 09/09/2018  . Type II diabetes mellitus with renal manifestations (HCC) 09/09/2018  . CKD (chronic kidney disease), stage III 09/09/2018  . Sepsis (HCC) 09/09/2018  . Depression with anxiety 09/09/2018  . Slurred speech 09/09/2018  . Ankle fracture, left 05/16/2018  . Amiodarone pulmonary toxicity   . Physical deconditioning   . 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 Acute on chronic congestive heart failure (HCC)   . Chronic combined systolic (congestive) and diastolic (congestive) heart failure (HCC) 01/16/2018  . Chronic systolic CHF (congestive heart failure) (HCC) 01/16/2018  . Atrial fibrillation (HCC) 10/21/2017  . CAD (coronary artery disease) of bypass graft 10/20/2017  . Coronary artery disease of bypass  graft of native heart with stable angina pectoris (HCC)   . Chronic low back pain 08/24/2016  . Pain of left thumb 08/05/2016  . Nocturnal hypoxia 06/02/2016  . Hoarseness 04/05/2016  . Laryngopharyngeal reflux (LPR) 04/05/2016  . Pharyngoesophageal dysphagia 04/05/2016  . Angina decubitus (HCC) 05/22/2015  . Chronic insomnia 05/06/2015  . Common migraine 05/14/2014  . Abnormality of gait 05/14/2014  . Memory change 05/14/2014  . Aneurysm, cerebral, nonruptured 05/14/2014  . Cramp of limb-Left neck 05/10/2014  . Hematemesis with nausea   . Vomiting blood   . Dizziness and giddiness 09/21/2013  . Atypical chest pain 07/18/2013  . Unstable angina (HCC) 04/09/2013  . Carotid artery stenosis, symptomatic 03/20/2013  . Cerebrovascular disease 07/10/2012  . Cerebral artery occlusion with cerebral infarction (HCC) 07/10/2012  . Mitral regurgitation 04/15/2012  . TIA (transient ischemic attack) 04/15/2012  . Occlusion and stenosis of carotid artery without mention of cerebral infarction 08/18/2011  . Bariatric surgery status 06/17/2011  . Speech abnormality 03/22/2011  . Dyspnea 02/24/2011  . PAPILLARY MUSCLE DYSFUNCTION, NON-RHEUMATIC 10/09/2008  . UNSPECIFIED VITAMIN D DEFICIENCY 10/24/2007  . MYOCARDIAL INFARCTION, HX OF 10/24/2007  . PERSISTENT VOMITING 10/24/2007  . OSTEOARTHRITIS 10/24/2007  . MIGRAINES, HX OF 10/24/2007  . Diabetes mellitus type 2, controlled, with complications (HCC) 01/16/2007  . Hyperlipemia 01/16/2007  . Obesity-post failed open gastroplasty 1984  01/16/2007  . OBSTRUCTIVE SLEEP APNEA 01/16/2007  . Essential hypertension 01/16/2007  . Coronary atherosclerosis 01/16/2007  . Asthma 01/16/2007  . GERD 01/16/2007  . VENTRAL HERNIA 01/16/2007      , PT, DPT 05/03/19 4:14 PM   [facility] [place] [address]  [address] [place], [place], [address] Phone: [phone]   Fax:   [phone]  Name: Emmamarie J Parlato [name]: [id number] Date of Birth: 02/09/1945   

## 2019-05-04 ENCOUNTER — Telehealth (HOSPITAL_COMMUNITY): Payer: Self-pay | Admitting: Cardiology

## 2019-05-04 NOTE — Telephone Encounter (Signed)
Patient called to report 10 lb weight gain-reports weight today is 231 lb Normally 224lb Mild SOB and cough denies increased swelling Patient has not been able to send cardiomems transmission as Internet is not set up    Advised would forward to provider as sliding scale is not documented/ordered

## 2019-05-07 ENCOUNTER — Ambulatory Visit (HOSPITAL_COMMUNITY): Admission: RE | Admit: 2019-05-07 | Payer: Medicare Other | Source: Ambulatory Visit

## 2019-05-07 ENCOUNTER — Other Ambulatory Visit: Payer: Self-pay

## 2019-05-07 ENCOUNTER — Ambulatory Visit: Payer: Medicare Other

## 2019-05-10 ENCOUNTER — Ambulatory Visit: Payer: Medicare Other | Admitting: Physical Therapy

## 2019-05-10 ENCOUNTER — Other Ambulatory Visit: Payer: Self-pay

## 2019-05-10 ENCOUNTER — Encounter: Payer: Self-pay | Admitting: Physical Therapy

## 2019-05-10 VITALS — BP 140/62 | HR 70

## 2019-05-10 DIAGNOSIS — M6281 Muscle weakness (generalized): Secondary | ICD-10-CM

## 2019-05-10 DIAGNOSIS — R2681 Unsteadiness on feet: Secondary | ICD-10-CM

## 2019-05-10 DIAGNOSIS — R262 Difficulty in walking, not elsewhere classified: Secondary | ICD-10-CM

## 2019-05-10 DIAGNOSIS — R2689 Other abnormalities of gait and mobility: Secondary | ICD-10-CM

## 2019-05-10 NOTE — Therapy (Signed)
[facility] [place] [address]  [place], [place], [address] Phone: [phone]   Fax:  [phone]  Physical Therapy Treatment  Patient Details  Name: [name] [name] [name]: [id number] Date of Birth: 09-13-1945 Referring Provider (PT): Loralie Champagne, MD   Encounter Date: 05/10/2019  PT End of Session - 05/10/19 1812    Visit Number  10    Number of Visits  17    Date for PT Re-Evaluation  05/30/19    Authorization Type  Medicare & [employer]    PT Start Time  1408   pt late   PT Stop Time  1446    PT Time Calculation (min)  38 min    Activity Tolerance  Patient tolerated treatment well;Patient limited by fatigue    Behavior During Therapy  [facility] for tasks assessed/performed       Past Medical History:  Diagnosis Date  . Anemia    hx  . Anxiety   . Asthma   . Basal cell carcinoma 05/2014   "left shoulder"  . Bundle branch block, left    chronic/notes 07/18/2013  . CHF (congestive heart failure) ([place])   . Chronic insomnia 05/06/2015  . Chronic kidney disease    frequency, sees dr Jamal Maes every 4 to 6 months (01/16/2018)  . Chronic lower back pain   . Claustrophobia   . Common migraine 05/14/2014  . Coronary artery disease    MI in 2001, 2002, 2006, 2011, 2014  . Depression   . Diabetic peripheral neuropathy ([place]) 01/12/2019  . GERD (gastroesophageal reflux disease)   . H/O hiatal hernia   . Headache    "at least 2/month" (01/16/2018)  . Heart murmur   . Hyperlipidemia   . Hypertension   . Memory change 05/14/2014  . Migraine    "5-6/year"  (01/16/2018)  . Obesity 01-2010  . Obstructive sleep apnea    "can't wear machine; I have claustrophobia" (01/16/2018), states she had a 2nd sleep study and does not have sleep apnea, her O2 decreases and now is on 2 L of O2 at night.  . On home oxygen therapy    "2L at night and prn during daytime" (01/16/2018)  . Osteoarthritis    "knees and hands" (01/16/2018)  .  Peripheral vascular disease (HCC)    ? numbness, tingling arms and legs  . PONV (postoperative nausea and vomiting)   . Stroke [facility] [place])    2014, 2015, 2016  . Type II diabetes mellitus ([place])   . Ventral hernia    hx of    Past Surgical History:  Procedure Laterality Date  . ANKLE FRACTURE SURGERY Left 1970's  . APPENDECTOMY  1970's   w/hysterectomy  . BASAL CELL CARCINOMA EXCISION Left 05/2014   "shoulder" (01/16/2018)  . CARDIAC CATHETERIZATION  10/10/2012   Dr [name].  Marland Kitchen CARDIAC CATHETERIZATION N/A 05/29/2015   Procedure: Right/Left Heart Cath and Coronary/Graft Angiography;  Surgeon: Larey Dresser, MD;  Location: [facility] CV LAB;  Service: Cardiovascular;  Laterality: N/A;  . CAROTID ENDARTERECTOMY Left 03/2013  . CAROTID STENT INSERTION Left 03/20/2013   Procedure: CAROTID STENT INSERTION;  Surgeon: [name], MD;  Location: [facility] CATH LAB;  Service: Cardiovascular;  Laterality: Left;  internal carotid  . CEREBRAL ANGIOGRAM N/A 04/05/2011   Procedure: CEREBRAL ANGIOGRAM;  Surgeon: Angelia Mould, MD;  Location: [facility] CATH LAB;  Service: Cardiovascular;  Laterality: N/A;  . CHOLECYSTECTOMY OPEN  2004  . CORONARY ANGIOPLASTY  WITH STENT PLACEMENT  01,02,05,06,07,08,11; 04/24/2013   "I've probably got ~ 10 stents by now" (04/24/2013)  . CORONARY ANGIOPLASTY WITH STENT PLACEMENT  06/13/2013   "got 4 stents today" (06/13/2013)  . CORONARY ARTERY BYPASS GRAFT  1220/11   "CABG X5"  . CORONARY STENT INTERVENTION N/A 10/20/2017   Procedure: CORONARY STENT INTERVENTION;  Surgeon: Kelly, Thomas A, MD;  Location: [facility] INVASIVE CV LAB;  Service: Cardiovascular;  Laterality: N/A;  . ESOPHAGOGASTRODUODENOSCOPY  08/03/2011   Procedure: ESOPHAGOGASTRODUODENOSCOPY (EGD);  Surgeon: David H Newman, MD;  Location: [facility] ENDOSCOPY;  Service: General;  Laterality: N/A;  . ESOPHAGOGASTRODUODENOSCOPY (EGD) WITH PROPOFOL N/A 03/11/2014   Procedure: ESOPHAGOGASTRODUODENOSCOPY (EGD) WITH PROPOFOL;  Surgeon: Dora M  Brodie, MD;  Location: [facility] ENDOSCOPY;  Service: Endoscopy;  Laterality: N/A;  . FRACTURE SURGERY    . gall stone removal  05/2003  . GASTRIC RESTRICTION SURGERY  1984   "stapeling"  . HERNIA REPAIR  2004   "in my stomach; had OR on it twice", wire mesh on 1 hernia  . LEFT HEART CATH AND CORS/GRAFTS ANGIOGRAPHY N/A 07/09/2016   Procedure: LEFT HEART CATH AND CORS/GRAFTS ANGIOGRAPHY;  Surgeon: McLean, Dalton S, MD;  Location: [facility];  Service: Cardiovascular;  Laterality: N/A;  . ORIF ANKLE FRACTURE Left 05/16/2018  . ORIF ANKLE FRACTURE Left 05/16/2018   Procedure: OPEN REDUCTION INTERNAL FIXATION (ORIF) Left ankle with possible syndesmosis fixation;  Surgeon: Rogers, Jason Patrick, MD;  Location: [facility] OR;  Service: Orthopedics;  Laterality: Left;  120min  . OVARY SURGERY  1970's   "tumor removed"  . PERCUTANEOUS CORONARY STENT INTERVENTION (PCI-S) N/A 06/13/2013   Procedure: PERCUTANEOUS CORONARY STENT INTERVENTION (PCI-S);  Surgeon: Jayadeep S Varanasi, MD;  Location: [facility] CATH LAB;  Service: Cardiovascular;  Laterality: N/A;  . PERCUTANEOUS STENT INTERVENTION N/A 04/24/2013   Procedure: PERCUTANEOUS STENT INTERVENTION;  Surgeon: Jayadeep S Varanasi, MD;  Location: [facility] CATH LAB;  Service: Cardiovascular;  Laterality: N/A;  . RIGHT/LEFT HEART CATH AND CORONARY/GRAFT ANGIOGRAPHY N/A 10/20/2017   Procedure: RIGHT/LEFT HEART CATH AND CORONARY/GRAFT ANGIOGRAPHY;  Surgeon: McLean, Dalton S, MD;  Location: [facility];  Service: Cardiovascular;  Laterality: N/A;  . ROOT CANAL  10/2000  . TEE WITHOUT CARDIOVERSION N/A 01/18/2019   Procedure: TRANSESOPHAGEAL ECHOCARDIOGRAM (TEE);  Surgeon: McLean, Dalton S, MD;  Location: [facility] ENDOSCOPY;  Service: Cardiovascular;  Laterality: N/A;  . TIBIA FRACTURE SURGERY Right 1970's   rods and pins  . TOOTH EXTRACTION     "1 on the upper; wisdom tooth on the lower" (01/16/2018)  . TOTAL ABDOMINAL HYSTERECTOMY  1970's   w/ appendectomy    Vitals:    05/10/19 1409  BP: 140/62  Pulse: 70  SpO2: 93%    Subjective Assessment - 05/10/19 1409    Subjective  Had a migraine on Monday- was vomitting and had photo/phonophobia. Has had a HA all week. Stuttering has been worse this week as well. Has tried to get in contact with her PCP's office but has not been able to get an appointment. Noticing that she has been retaining more fluis through her midsection.Blood glucose was normal this AM.    Pertinent History  DM II, stroke 2014, 2015, 2016, PVD, supplemental O2 use, migraine, memory change, HTN, HLD, HA, hiatal hernia, GERD, peripheral neuropathy, depression, CAD, claustrophobia, CHF, L BBB, asthma, anxiety, anemia, L ankle ORIF 05/2018, CABG x5 2011    Diagnostic tests  none recent    Patient Stated Goals  "get to walking again   and get control of my fatigue"    Currently in Pain?  Yes    Pain Score  3     Pain Location  Head    Pain Orientation  Right;Left;Anterior;Posterior    Pain Descriptors / Indicators  Aching    Pain Type  Acute pain                       [facility] Adult PT Treatment/Exercise - 05/10/19 0001      Exercises   Exercises  Knee/Hip;Lumbar      Lumbar Exercises: Seated   Other Seated Lumbar Exercises  isometric ab set with beachball in lap 10x5"   cues for rhythmic breathing    Other Seated Lumbar Exercises  row with red TB x10    cues for scap retraction and depression     Knee/Hip Exercises: Seated   Long Arc Quad  Right;Left;10 reps;Strengthening    Long Arc Quad Limitations  red loop around ankles    Other Seated Knee/Hip Exercises  sitting paloff press with red TB x10 each side     Hamstring Curl  Strengthening;Right;Left;1 set;10 reps    Hamstring Limitations  green TB             PT Education - 05/10/19 1810    Education Details  discussion on patient's recent symptoms; advised patient to get in touch with PCP today to address fluid retention and HA concerns    Person(s) Educated   Patient    Methods  Explanation;Demonstration;Tactile cues;Verbal cues;Handout    Comprehension  Verbalized understanding;Returned demonstration       PT Short Term Goals - 05/03/19 1409      PT SHORT TERM GOAL #1   Title  Patient to be independent with initial HEP.    Time  3    Period  Weeks    Status  Achieved    Target Date  04/25/19        PT Long Term Goals - 05/03/19 1409      PT LONG TERM GOAL #1   Title  Patient to be independent with advanced HEP.    Time  8    Period  Weeks    Status  Partially Met   recently limited by dizziness     PT LONG TERM GOAL #2   Title  Patient to demonstrate B LE strength >/=4+/5.    Time  8    Period  Weeks    Status  Partially Met   improvement in B hip abduction and adduction, L hip flexion, L knee flexion, and L ankle dorsiflexion strength     PT LONG TERM GOAL #3   Title  Patient to score <14 sec on TUG testing in order to decrease risk of falls.    Baseline  38.34 sec with RW    Time  8    Period  Weeks    Status  Partially Met   05/03/19: 23.08 sec     PT LONG TERM GOAL #4   Title  Patient to score <12 sec on 5xSTS in order to decrease risk of falls.    Baseline  28.29 sec pushing off from B armrests    Time  8    Period  Weeks    Status  Partially Met   05/03/19: 14.29 sec pushing off from LEs     PT LONG TERM GOAL #5   Title  Patient to report tolerance for 15 min  of walking with LRAD without fatigue limiting.    Time  8    Period  Weeks    Status  On-going   reports limited to 5-6 minutes d/t SOB and LE buckling           Plan - 05/10/19 1812    Clinical Impression Statement  Patient reporting having a Migraine on Monday followed by vomiting and photo/phonophobia. Having continued headache throughout the week and also noting increased fluid retention in her abdomen. Notes that she was unable to get in with her Cardiologist, thus advised patient to f/u with PCP after her session d/t her concerns,  especially d/t having a fall and hitting her head last weekend. D/t the fact that patient has hx of migraines and that vitals were stable, proceeded with session while monitoring for symptoms. Worked on lower-intensity LE and core strengthening exercises in sitting with patient demonstrating good attention to form throughout. Did require cues to promote scapular retraction and depression with rows, which patient made effort to current. Updated HEP with exercises that were well-tolerated today. Patient asymptomatic upon leaving session today.    Comorbidities  DM II, stroke 2014, 2015, 2016, PVD, supplemental O2 use, migraine, memory change, HTN, HLD, HA, hiatal hernia, GERD, peripheral neuropathy, depression, CAD, claustrophobia, CHF, L BBB, asthma, anxiety, anemia, L ankle ORIF 05/2018, CABG x5 2011    Rehab Potential  Good    PT Treatment/Interventions  ADLs/Self Care Home Management;Cryotherapy;Electrical Stimulation;Moist Heat;Balance training;Therapeutic exercise;Therapeutic activities;Functional mobility training;Stair training;Gait training;Ultrasound;Neuromuscular re-education;Patient/family education;Manual techniques;Taping;Energy conservation;Dry needling;Passive range of motion    PT Next Visit Plan  LE strengthening; gait training for improved RW safety    Consulted and Agree with Plan of Care  Patient       Patient will benefit from skilled therapeutic intervention in order to improve the following deficits and impairments:  Abnormal gait, Decreased endurance, Cardiopulmonary status limiting activity, Decreased activity tolerance, Decreased strength, Pain, Decreased balance, Decreased mobility, Difficulty walking, Improper body mechanics, Decreased range of motion, Impaired flexibility, Postural dysfunction  Visit Diagnosis: Muscle weakness (generalized)  Unsteadiness on feet  Other abnormalities of gait and mobility  Difficulty in walking, not elsewhere classified     Problem  List Patient Active Problem List   Diagnosis Date Noted  . Symptomatic bradycardia 01/16/2019  . Normocytic anemia 01/16/2019  . Diabetic peripheral neuropathy (HCC) 01/12/2019  . Neurologic abnormality 12/25/2018  . Debility 09/21/2018  . Hypotension 09/18/2018  . Bradycardia 09/18/2018  . AKI (acute kidney injury) (HCC) 09/18/2018  . Pressure injury of skin 09/18/2018  . Ischemic cerebrovascular accident (CVA) of frontal lobe (HCC) 09/13/2018  . Fall 09/09/2018  . History of CVA (cerebrovascular accident) 09/09/2018  . Type II diabetes mellitus with renal manifestations (HCC) 09/09/2018  . CKD (chronic kidney disease), stage III 09/09/2018  . Sepsis (HCC) 09/09/2018  . Depression with anxiety 09/09/2018  . Slurred speech 09/09/2018  . Ankle fracture, left 05/16/2018  . Amiodarone pulmonary toxicity   . Physical deconditioning   . 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 Acute on chronic congestive heart failure (HCC)   . Chronic combined systolic (congestive) and diastolic (congestive) heart failure (HCC) 01/16/2018  . Chronic systolic CHF (congestive heart failure) (HCC) 01/16/2018  . Atrial fibrillation (HCC) 10/21/2017  . CAD (coronary artery disease) of bypass graft 10/20/2017  . Coronary artery disease of bypass graft of native heart with stable angina pectoris (HCC)   . Chronic low back pain 08/24/2016  . Pain of left thumb   08/05/2016  . Nocturnal hypoxia 06/02/2016  . Hoarseness 04/05/2016  . Laryngopharyngeal reflux (LPR) 04/05/2016  . Pharyngoesophageal dysphagia 04/05/2016  . Angina decubitus (HCC) 05/22/2015  . Chronic insomnia 05/06/2015  . Common migraine 05/14/2014  . Abnormality of gait 05/14/2014  . Memory change 05/14/2014  . Aneurysm, cerebral, nonruptured 05/14/2014  . Cramp of limb-Left neck 05/10/2014  . Hematemesis with nausea   . Vomiting blood   . Dizziness and giddiness 09/21/2013  . Atypical chest pain 07/18/2013  . Unstable angina (HCC) 04/09/2013  .  Carotid artery stenosis, symptomatic 03/20/2013  . Cerebrovascular disease 07/10/2012  . Cerebral artery occlusion with cerebral infarction (HCC) 07/10/2012  . Mitral regurgitation 04/15/2012  . TIA (transient ischemic attack) 04/15/2012  . Occlusion and stenosis of carotid artery without mention of cerebral infarction 08/18/2011  . Bariatric surgery status 06/17/2011  . Speech abnormality 03/22/2011  . Dyspnea 02/24/2011  . PAPILLARY MUSCLE DYSFUNCTION, NON-RHEUMATIC 10/09/2008  . UNSPECIFIED VITAMIN D DEFICIENCY 10/24/2007  . MYOCARDIAL INFARCTION, HX OF 10/24/2007  . PERSISTENT VOMITING 10/24/2007  . OSTEOARTHRITIS 10/24/2007  . MIGRAINES, HX OF 10/24/2007  . Diabetes mellitus type 2, controlled, with complications (HCC) 01/16/2007  . Hyperlipemia 01/16/2007  . Obesity-post failed open gastroplasty 1984  01/16/2007  . OBSTRUCTIVE SLEEP APNEA 01/16/2007  . Essential hypertension 01/16/2007  . Coronary atherosclerosis 01/16/2007  . Asthma 01/16/2007  . GERD 01/16/2007  . VENTRAL HERNIA 01/16/2007      , PT, DPT 05/10/19 6:19 PM   [facility] [place] [facility] [address]  [address] [place], [place], [address] Phone: [phone]   Fax:  [phone]  Name: [name] [name] [name]: [id number] Date of Birth: 09/15/1945   

## 2019-05-14 ENCOUNTER — Other Ambulatory Visit: Payer: Self-pay

## 2019-05-14 ENCOUNTER — Ambulatory Visit: Payer: Medicare Other

## 2019-05-14 VITALS — BP 132/62 | HR 78

## 2019-05-14 DIAGNOSIS — M6281 Muscle weakness (generalized): Secondary | ICD-10-CM

## 2019-05-14 DIAGNOSIS — R2689 Other abnormalities of gait and mobility: Secondary | ICD-10-CM

## 2019-05-14 DIAGNOSIS — R2681 Unsteadiness on feet: Secondary | ICD-10-CM

## 2019-05-14 DIAGNOSIS — R262 Difficulty in walking, not elsewhere classified: Secondary | ICD-10-CM

## 2019-05-14 NOTE — Therapy (Signed)
[facility] [address]  [place], [place], [address] Phone: [phone]   Fax:  [phone]  Physical Therapy Treatment  Patient Details  Name: [name] [name] [name]: [id number] Date of Birth: [birth year] Referring Provider (PT): Loralie Champagne, MD   Encounter Date: 05/14/2019  PT End of Session - 05/14/19 1411    Visit Number  11    Number of Visits  17    Date for PT Re-Evaluation  05/30/19    Authorization Type  Medicare & AARP    PT Start Time  1403    PT Stop Time  1441    PT Time Calculation (min)  38 min    Activity Tolerance  Patient tolerated treatment well;Patient limited by fatigue    Behavior During Therapy  [facility] for tasks assessed/performed       Past Medical History:  Diagnosis Date  . Anemia    hx  . Anxiety   . Asthma   . Basal cell carcinoma 05/2014   "left shoulder"  . Bundle branch block, left    chronic/notes 07/18/2013  . CHF (congestive heart failure) ([place])   . Chronic insomnia 05/06/2015  . Chronic kidney disease    frequency, sees dr Jamal Maes every 4 to 6 months (01/16/2018)  . Chronic lower back pain   . Claustrophobia   . Common migraine 05/14/2014  . Coronary artery disease    MI in 2001, 2002, 2006, 2011, 2014  . Depression   . Diabetic peripheral neuropathy ([place]) 01/12/2019  . GERD (gastroesophageal reflux disease)   . H/O hiatal hernia   . Headache    "at least 2/month" (01/16/2018)  . Heart murmur   . Hyperlipidemia   . Hypertension   . Memory change 05/14/2014  . Migraine    "5-6/year"  (01/16/2018)  . Obesity 01-2010  . Obstructive sleep apnea    "can't wear machine; I have claustrophobia" (01/16/2018), states she had a 2nd sleep study and does not have sleep apnea, her O2 decreases and now is on 2 L of O2 at night.  . On home oxygen therapy    "2L at night and prn during daytime" (01/16/2018)  . Osteoarthritis    "knees and hands" (01/16/2018)  . Peripheral  vascular disease (HCC)    ? numbness, tingling arms and legs  . PONV (postoperative nausea and vomiting)   . Stroke [facility])    2014, 2015, 2016  . Type II diabetes mellitus ([place])   . Ventral hernia    hx of    Past Surgical History:  Procedure Laterality Date  . ANKLE FRACTURE SURGERY Left 1970's  . APPENDECTOMY  1970's   w/hysterectomy  . BASAL CELL CARCINOMA EXCISION Left 05/2014   "shoulder" (01/16/2018)  . CARDIAC CATHETERIZATION  10/10/2012   Dr [name].  Marland Kitchen CARDIAC CATHETERIZATION N/A 05/29/2015   Procedure: Right/Left Heart Cath and Coronary/Graft Angiography;  Surgeon: Larey Dresser, MD;  Location: [place] [facility];  Service: Cardiovascular;  Laterality: N/A;  . CAROTID ENDARTERECTOMY Left 03/2013  . CAROTID STENT INSERTION Left 03/20/2013   Procedure: CAROTID STENT INSERTION;  Surgeon: [name], MD;  Location: [facility] CATH LAB;  Service: Cardiovascular;  Laterality: Left;  internal carotid  . CEREBRAL ANGIOGRAM N/A 04/05/2011   Procedure: CEREBRAL ANGIOGRAM;  Surgeon: Angelia Mould, MD;  Location: [facility] CATH LAB;  Service: Cardiovascular;  Laterality: N/A;  . CHOLECYSTECTOMY OPEN  2004  . CORONARY ANGIOPLASTY WITH STENT PLACEMENT  01,02,05,06,07,08,11; 04/24/2013   "I've probably got ~ 10 stents by now" (04/24/2013)  . CORONARY ANGIOPLASTY WITH STENT PLACEMENT  06/13/2013   "got 4 stents today" (06/13/2013)  . CORONARY ARTERY BYPASS GRAFT  1220/11   "CABG X5"  . CORONARY STENT INTERVENTION N/A 10/20/2017   Procedure: CORONARY STENT INTERVENTION;  Surgeon: Troy Sine, MD;  Location: [place] [facility];  Service: Cardiovascular;  Laterality: N/A;  . ESOPHAGOGASTRODUODENOSCOPY  08/03/2011   Procedure: ESOPHAGOGASTRODUODENOSCOPY (EGD);  Surgeon: Shann Medal, MD;  Location: [facility];  Service: General;  Laterality: N/A;  . ESOPHAGOGASTRODUODENOSCOPY (EGD) WITH PROPOFOL N/A 03/11/2014   Procedure: ESOPHAGOGASTRODUODENOSCOPY (EGD) WITH PROPOFOL;  Surgeon: Lafayette Dragon, MD;   Location: [facility] ENDOSCOPY;  Service: Endoscopy;  Laterality: N/A;  . FRACTURE SURGERY    . gall stone removal  05/2003  . GASTRIC RESTRICTION SURGERY  1984   "stapeling"  . HERNIA REPAIR  2004   "in my stomach; had OR on it twice", wire mesh on 1 hernia  . LEFT HEART CATH AND CORS/GRAFTS ANGIOGRAPHY N/A 07/09/2016   Procedure: LEFT HEART CATH AND CORS/GRAFTS ANGIOGRAPHY;  Surgeon: Larey Dresser, MD;  Location: [place] [facility];  Service: Cardiovascular;  Laterality: N/A;  . ORIF ANKLE FRACTURE Left 05/16/2018  . ORIF ANKLE FRACTURE Left 05/16/2018   Procedure: OPEN REDUCTION INTERNAL FIXATION (ORIF) Left ankle with possible syndesmosis fixation;  Surgeon: Nicholes Stairs, MD;  Location: [facility];  Service: Orthopedics;  Laterality: Left;  142mn  . OVARY SURGERY  1970's   "tumor removed"  . PERCUTANEOUS CORONARY STENT INTERVENTION (PCI-S) N/A 06/13/2013   Procedure: PERCUTANEOUS CORONARY STENT INTERVENTION (PCI-S);  Surgeon: JJettie Booze MD;  [facility];  Service: Cardiovascular;  Laterality: N/A;  . PERCUTANEOUS STENT INTERVENTION N/A 04/24/2013   Procedure: PERCUTANEOUS STENT INTERVENTION;  Surgeon: JJettie Booze MD;  [facility];  Service: Cardiovascular;  Laterality: N/A;  . RIGHT/LEFT HEART CATH AND CORONARY/GRAFT ANGIOGRAPHY N/A 10/20/2017   Procedure: RIGHT/LEFT HEART CATH AND CORONARY/GRAFT ANGIOGRAPHY;  Surgeon: MLarey Dresser MD;  [facility];  Service: Cardiovascular;  Laterality: N/A;  . ROOT CANAL  10/2000  . TEE WITHOUT CARDIOVERSION N/A 01/18/2019   Procedure: TRANSESOPHAGEAL ECHOCARDIOGRAM (TEE);  Surgeon: MLarey Dresser MD;  [facility];  Service: Cardiovascular;  Laterality: N/A;  . TIBIA FRACTURE SURGERY Right 1970's   rods and pins  . TOOTH EXTRACTION     "1 on the upper; wisdom tooth on the lower" (01/16/2018)  . TOTAL ABDOMINAL HYSTERECTOMY  1970's   w/ appendectomy    Vitals:   05/14/19 1407   BP: 132/62  Pulse: 78  SpO2: 98%    Subjective Assessment - 05/14/19 1407    Subjective  Pt. noting HAs have improved over the past two days.  Notes she got second Covid-19 vaccine injection on Thurday of last week.    Pertinent History  DM II, stroke 2014, 2015, 2016, PVD, supplemental O2 use, migraine, memory change, HTN, HLD, HA, hiatal hernia, GERD, peripheral neuropathy, depression, CAD, claustrophobia, CHF, L BBB, asthma, anxiety, anemia, L ankle ORIF 05/2018, CABG x5 2011    Diagnostic tests  none recent    Patient Stated Goals  "get to walking again and get control of my fatigue"    Currently in Pain?  Yes    Pain Score  2     Pain Location  Head    Pain Orientation  Right;Left;Anterior    Pain  Descriptors / Indicators  Aching    Pain Type  Acute pain    Multiple Pain Sites  Yes    Pain Score  4    Pain Location  Back    Pain Orientation  Right    Pain Descriptors / Indicators  Constant    Pain Type  Acute pain    Pain Score  4    Pain Location  Ankle    Pain Orientation  Left;Medial    Pain Descriptors / Indicators  Jabbing    Pain Type  Acute pain    Pain Frequency  Intermittent    Aggravating Factors   Unsure         [facility] PT Assessment - 05/14/19 0001      Assessment   Medical Diagnosis  Generalized Weakness    Referring Provider (PT)  Loralie Champagne, MD    Onset Date/Surgical Date  03/04/18    Hand Dominance  Right    Next MD Visit  06/06/19                   [facility] Adult PT Treatment/Exercise - 05/14/19 0001      Lumbar Exercises: Seated   Other Seated Lumbar Exercises  isometric ab set with UE resistance on LE isometric hip flexion + abdom. bracing 5" x 10 reps      Knee/Hip Exercises: Aerobic   Nustep  Lvl 3, 6 min (LE only)      Knee/Hip Exercises: Standing   Heel Raises  15 reps;Both    Heel Raises Limitations  toe raises    Knee Flexion  Right;Left;10 reps    Knee Flexion Limitations  in RW    Hip Flexion  Right;Left;10 reps;Knee  bent;Stengthening;1 set    Hip Flexion Limitations  in RW    Hip Abduction  Right;Left;10 reps;Knee straight;Stengthening    Abduction Limitations  at counter     Functional Squat  10 reps;3 seconds    Functional Squat Limitations  RW               PT Short Term Goals - 05/03/19 1409      PT SHORT TERM GOAL #1   Title  Patient to be independent with initial HEP.    Time  3    Period  Weeks    Status  Achieved    Target Date  04/25/19        PT Long Term Goals - 05/03/19 1409      PT LONG TERM GOAL #1   Title  Patient to be independent with advanced HEP.    Time  8    Period  Weeks    Status  Partially Met   recently limited by dizziness     PT LONG TERM GOAL #2   Title  Patient to demonstrate B LE strength >/=4+/5.    Time  8    Period  Weeks    Status  Partially Met   improvement in B hip abduction and adduction, L hip flexion, L knee flexion, and L ankle dorsiflexion strength     PT LONG TERM GOAL #3   Title  Patient to score <14 sec on TUG testing in order to decrease risk of falls.    Baseline  38.34 sec with RW    Time  8    Period  Weeks    Status  Partially Met   05/03/19: 23.08 sec     PT LONG TERM GOAL #4  Title  Patient to score <12 sec on 5xSTS in order to decrease risk of falls.    Baseline  28.29 sec pushing off from B armrests    Time  8    Period  Weeks    Status  Partially Met   05/03/19: 14.29 sec pushing off from LEs     PT LONG TERM GOAL #5   Title  Patient to report tolerance for 15 min of walking with LRAD without fatigue limiting.    Time  8    Period  Weeks    Status  On-going   reports limited to 5-6 minutes d/t SOB and LE buckling           Plan - 05/14/19 1423    Clinical Impression Statement  Mora reporting she had second Covid-19 vaccine injection on Thursday of last week and has felt somewhat low on energy since this.  Session focused on gentle LE strengthening to pt. tolerance along with increased duration  with sitting rest breaks for recovery of SOB.  Ended visit with pt. noting LE fatigue and denying recent falls.    Comorbidities  DM II, stroke 2014, 2015, 2016, PVD, supplemental O2 use, migraine, memory change, HTN, HLD, HA, hiatal hernia, GERD, peripheral neuropathy, depression, CAD, claustrophobia, CHF, L BBB, asthma, anxiety, anemia, L ankle ORIF 05/2018, CABG x5 2011    Rehab Potential  Good    PT Treatment/Interventions  ADLs/Self Care Home Management;Cryotherapy;Electrical Stimulation;Moist Heat;Balance training;Therapeutic exercise;Therapeutic activities;Functional mobility training;Stair training;Gait training;Ultrasound;Neuromuscular re-education;Patient/family education;Manual techniques;Taping;Energy conservation;Dry needling;Passive range of motion    PT Next Visit Plan  LE strengthening; gait training for improved RW safety    Consulted and Agree with Plan of Care  Patient       Patient will benefit from skilled therapeutic intervention in order to improve the following deficits and impairments:  Abnormal gait, Decreased endurance, Cardiopulmonary status limiting activity, Decreased activity tolerance, Decreased strength, Pain, Decreased balance, Decreased mobility, Difficulty walking, Improper body mechanics, Decreased range of motion, Impaired flexibility, Postural dysfunction  Visit Diagnosis: Muscle weakness (generalized)  Unsteadiness on feet  Other abnormalities of gait and mobility  Difficulty in walking, not elsewhere classified     Problem List Patient Active Problem List   Diagnosis Date Noted  . Symptomatic bradycardia 01/16/2019  . Normocytic anemia 01/16/2019  . Diabetic peripheral neuropathy ([place]) 01/12/2019  . Neurologic abnormality 12/25/2018  . Debility 09/21/2018  . Hypotension 09/18/2018  . Bradycardia 09/18/2018  . AKI (acute kidney injury) ([place]) 09/18/2018  . Pressure injury of skin 09/18/2018  . Ischemic cerebrovascular accident (CVA) of frontal  lobe ([place]) 09/13/2018  . Fall 09/09/2018  . History of CVA (cerebrovascular accident) 09/09/2018  . Type II diabetes mellitus with renal manifestations ([place]) 09/09/2018  . CKD (chronic kidney disease), stage III 09/09/2018  . Sepsis ([place]) 09/09/2018  . Depression with anxiety 09/09/2018  . Slurred speech 09/09/2018  . Ankle fracture, left 05/16/2018  . Amiodarone pulmonary toxicity   . Physical deconditioning   . 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 Acute on chronic congestive heart failure ([place])   . Chronic combined systolic (congestive) and diastolic (congestive) heart failure ([place]) 01/16/2018  . Chronic systolic CHF (congestive heart failure) ([place]) 01/16/2018  . Atrial fibrillation ([place]) 10/21/2017  . CAD (coronary artery disease) of bypass graft 10/20/2017  . Coronary artery disease of bypass graft of native heart with stable angina pectoris ([place])   . Chronic low back pain 08/24/2016  . Pain of left thumb  08/05/2016  . Nocturnal hypoxia 06/02/2016  . Hoarseness 04/05/2016  . Laryngopharyngeal reflux (LPR) 04/05/2016  . Pharyngoesophageal dysphagia 04/05/2016  . Angina decubitus ([place]) 05/22/2015  . Chronic insomnia 05/06/2015  . Common migraine 05/14/2014  . Abnormality of gait 05/14/2014  . Memory change 05/14/2014  . Aneurysm, cerebral, nonruptured 05/14/2014  . Cramp of limb-Left neck 05/10/2014  . Hematemesis with nausea   . Vomiting blood   . Dizziness and giddiness 09/21/2013  . Atypical chest pain 07/18/2013  . Unstable angina ([place]) 04/09/2013  . Carotid artery stenosis, symptomatic 03/20/2013  . Cerebrovascular disease 07/10/2012  . Cerebral artery occlusion with cerebral infarction ([place]) 07/10/2012  . Mitral regurgitation 04/15/2012  . TIA (transient ischemic attack) 04/15/2012  . Occlusion and stenosis of carotid artery without mention of cerebral infarction 08/18/2011  . Bariatric surgery status 06/17/2011  . Speech abnormality 03/22/2011  . Dyspnea 02/24/2011  .  PAPILLARY MUSCLE DYSFUNCTION, NON-RHEUMATIC 10/09/2008  . UNSPECIFIED VITAMIN D DEFICIENCY 10/24/2007  . MYOCARDIAL INFARCTION, HX OF 10/24/2007  . PERSISTENT VOMITING 10/24/2007  . OSTEOARTHRITIS 10/24/2007  . MIGRAINES, HX OF 10/24/2007  . Diabetes mellitus type 2, controlled, with complications ([place]) 67/22/7737  . Hyperlipemia 01/16/2007  . Obesity-post failed open gastroplasty 1984  01/16/2007  . OBSTRUCTIVE SLEEP APNEA 01/16/2007  . Essential hypertension 01/16/2007  . Coronary atherosclerosis 01/16/2007  . Asthma 01/16/2007  . GERD 01/16/2007  . VENTRAL HERNIA 01/16/2007    Bess Harvest, PTA 05/14/19 6:33 PM    [facility] [address]  [place], [place], [address] Phone: [phone]   Fax:  [phone]  Name: BURGUNDY MATUSZAK [name]: [id number] Date of Birth: Apr 06, 1945

## 2019-05-16 ENCOUNTER — Ambulatory Visit: Payer: Medicare Other | Admitting: Physical Therapy

## 2019-05-16 NOTE — Telephone Encounter (Signed)
Please call increase torsemide 80 mg twice a day for 2 days the back to 80 mg/40 mg.   Set up for follow up.   Tiaja Hagan 4:15 PM

## 2019-05-16 NOTE — Telephone Encounter (Signed)
CardioMems readings reviewed by Amy Clegg,NP Patient will need to follow up with [facility]- readings are bad (poor wavelengths)   Patient aware she should contact patient care/technical support [phone]  During call patient again reports increase in weight Weight up ~10 lbs in the past 2 week +SOB +edema Weight today 224.2 Weight 4/12 228.8  Patient did not increase diuretics or take a metolazone  Will forward to provider

## 2019-05-17 ENCOUNTER — Ambulatory Visit (INDEPENDENT_AMBULATORY_CARE_PROVIDER_SITE_OTHER): Payer: Medicare Other | Admitting: Neurology

## 2019-05-17 ENCOUNTER — Other Ambulatory Visit: Payer: Self-pay

## 2019-05-17 ENCOUNTER — Ambulatory Visit: Payer: Medicare Other

## 2019-05-17 ENCOUNTER — Encounter: Payer: Self-pay | Admitting: Neurology

## 2019-05-17 ENCOUNTER — Other Ambulatory Visit: Payer: Self-pay | Admitting: Family Medicine

## 2019-05-17 ENCOUNTER — Ambulatory Visit
Admission: RE | Admit: 2019-05-17 | Discharge: 2019-05-17 | Disposition: A | Discharge status: Home / Self Care | Payer: Medicare Other | Source: Ambulatory Visit | Attending: Family Medicine | Admitting: Family Medicine

## 2019-05-17 VITALS — BP 90/57 | HR 71 | Temp 97.2°F | Ht 62.0 in | Wt 224.0 lb

## 2019-05-17 DIAGNOSIS — R05 Cough: Secondary | ICD-10-CM

## 2019-05-17 DIAGNOSIS — R413 Other amnesia: Secondary | ICD-10-CM

## 2019-05-17 DIAGNOSIS — R059 Cough, unspecified: Secondary | ICD-10-CM

## 2019-05-17 DIAGNOSIS — G43009 Migraine without aura, not intractable, without status migrainosus: Secondary | ICD-10-CM

## 2019-05-17 DIAGNOSIS — R4789 Other speech disturbances: Secondary | ICD-10-CM

## 2019-05-17 DIAGNOSIS — R269 Unspecified abnormalities of gait and mobility: Secondary | ICD-10-CM

## 2019-05-17 NOTE — Progress Notes (Signed)
Reason for visit: Peripheral neuropathy, gait disorder, cerebrovascular disease  [name] [name] is an 74 y.o. female  History of present illness:  [name] [name] is a 74 year old right-handed white female with a history of obesity, chronic shortness of breath, diabetic peripheral neuropathy, gait disorder, and a history of cerebrovascular disease.  The patient had a stuttering speech pattern when last seen, it was felt to be nonorganic.  She is still having some issues with this but it is not as prominent.  The patient currently is in physical therapy, she is trying to increase her stamina with walking, she is still limited by her shortness of breath.  She did have 1 fall 3 weeks ago when she was trying to put on her pants by balancing on 1 leg while standing.  She fell over with minor bruising.  The patient has gained good benefit with her ability to rest at night using trazodone.  At times, she will wake up in the middle the night and see people walking about the house coming out of sleep, she does not have hallucinations during the day.  The patient claims that this issue predated the use of the trazodone.  The patient returns for further evaluation.  Past Medical History:  Diagnosis Date  . Anemia    hx  . Anxiety   . Asthma   . Basal cell carcinoma 05/2014   "left shoulder"  . Bundle branch block, left    chronic/notes 07/18/2013  . CHF (congestive heart failure) ([place])   . Chronic insomnia 05/06/2015  . Chronic kidney disease    frequency, sees dr Jamal Maes every 4 to 6 months (01/16/2018)  . Chronic lower back pain   . Claustrophobia   . Common migraine 05/14/2014  . Coronary artery disease    MI in 2001, 2002, 2006, 2011, 2014  . Depression   . Diabetic peripheral neuropathy ([place]) 01/12/2019  . GERD (gastroesophageal reflux disease)   . H/O hiatal hernia   . Headache    "at least 2/month" (01/16/2018)  . Heart murmur   . Hyperlipidemia   . Hypertension   . Memory change  05/14/2014  . Migraine    "5-6/year"  (01/16/2018)  . Obesity 01-2010  . Obstructive sleep apnea    "can't wear machine; I have claustrophobia" (01/16/2018), states she had a 2nd sleep study and does not have sleep apnea, her O2 decreases and now is on 2 L of O2 at night.  . On home oxygen therapy    "2L at night and prn during daytime" (01/16/2018)  . Osteoarthritis    "knees and hands" (01/16/2018)  . Peripheral vascular disease (HCC)    ? numbness, tingling arms and legs  . PONV (postoperative nausea and vomiting)   . Stroke [facility])    2014, 2015, 2016  . Type II diabetes mellitus ([place])   . Ventral hernia    hx of    Past Surgical History:  Procedure Laterality Date  . ANKLE FRACTURE SURGERY Left 1970's  . APPENDECTOMY  1970's   w/hysterectomy  . BASAL CELL CARCINOMA EXCISION Left 05/2014   "shoulder" (01/16/2018)  . CARDIAC CATHETERIZATION  10/10/2012   Dr [name].  Marland Kitchen CARDIAC CATHETERIZATION N/A 05/29/2015   Procedure: Right/Left Heart Cath and Coronary/Graft Angiography;  Surgeon: Larey Dresser, MD;  Location: [place] [facility];  Service: Cardiovascular;  Laterality: N/A;  . CAROTID ENDARTERECTOMY Left 03/2013  . CAROTID STENT INSERTION Left 03/20/2013   Procedure: CAROTID STENT INSERTION;  Surgeon: [name], MD;  Location: [facility] CATH LAB;  Service: Cardiovascular;  Laterality: Left;  internal carotid  . CEREBRAL ANGIOGRAM N/A 04/05/2011   Procedure: CEREBRAL ANGIOGRAM;  Surgeon: Angelia Mould, MD;  Location: [facility] CATH LAB;  Service: Cardiovascular;  Laterality: N/A;  . CHOLECYSTECTOMY OPEN  2004  . CORONARY ANGIOPLASTY WITH STENT PLACEMENT  01,02,05,06,07,08,11; 04/24/2013   "I've probably got ~ 10 stents by now" (04/24/2013)  . CORONARY ANGIOPLASTY WITH STENT PLACEMENT  06/13/2013   "got 4 stents today" (06/13/2013)  . CORONARY ARTERY BYPASS GRAFT  1220/11   "CABG X5"  . CORONARY STENT INTERVENTION N/A 10/20/2017   Procedure: CORONARY STENT INTERVENTION;  Surgeon:  Troy Sine, MD;  Location: [place] [facility];  Service: Cardiovascular;  Laterality: N/A;  . ESOPHAGOGASTRODUODENOSCOPY  08/03/2011   Procedure: ESOPHAGOGASTRODUODENOSCOPY (EGD);  Surgeon: Shann Medal, MD;  Location[facility];  Service: General;  Laterality: N/A;  . ESOPHAGOGASTRODUODENOSCOPY (EGD) WITH PROPOFOL N/A 03/11/2014   Procedure: ESOPHAGOGASTRODUODENOSCOPY (EGD) WITH PROPOFOL;  Surgeon: Lafayette Dragon, MD;  Location: [facility];  Service: Endoscopy;  Laterality: N/A;  . FRACTURE SURGERY    . gall stone removal  05/2003  . GASTRIC RESTRICTION SURGERY  1984   "stapeling"  . HERNIA REPAIR  2004   "in my stomach; had OR on it twice", wire mesh on 1 hernia  . LEFT HEART CATH AND CORS/GRAFTS ANGIOGRAPHY N/A 07/09/2016   Procedure: LEFT HEART CATH AND CORS/GRAFTS ANGIOGRAPHY;  Surgeon: Larey Dresser, MD;  Location: [place] [facility];  Service: Cardiovascular;  Laterality: N/A;  . ORIF ANKLE FRACTURE Left 05/16/2018  . ORIF ANKLE FRACTURE Left 05/16/2018   Procedure: OPEN REDUCTION INTERNAL FIXATION (ORIF) Left ankle with possible syndesmosis fixation;  Surgeon: Nicholes Stairs, MD;  Location: [place];  Service: Orthopedics;  Laterality: Left;  163min  . OVARY SURGERY  1970's   "tumor removed"  . PERCUTANEOUS CORONARY STENT INTERVENTION (PCI-S) N/A 06/13/2013   Procedure: PERCUTANEOUS CORONARY STENT INTERVENTION (PCI-S);  Surgeon: [name], MD;  Location: [facility] CATH LAB;  Service: Cardiovascular;  Laterality: N/A;  . PERCUTANEOUS STENT INTERVENTION N/A 04/24/2013   Procedure: PERCUTANEOUS STENT INTERVENTION;  Surgeon: [name], MD;  Location: [facility] CATH LAB;  Service: Cardiovascular;  Laterality: N/A;  . RIGHT/LEFT HEART CATH AND CORONARY/GRAFT ANGIOGRAPHY N/A 10/20/2017   Procedure: RIGHT/LEFT HEART CATH AND CORONARY/GRAFT ANGIOGRAPHY;  Surgeon: Larey Dresser, MD;  Location: [place] [facility];  Service: Cardiovascular;  Laterality: N/A;  . ROOT CANAL  10/2000    . TEE WITHOUT CARDIOVERSION N/A 01/18/2019   Procedure: TRANSESOPHAGEAL ECHOCARDIOGRAM (TEE);  Surgeon: Larey Dresser, MD;  Location: [facility] ENDOSCOPY;  Service: Cardiovascular;  Laterality: N/A;  . TIBIA FRACTURE SURGERY Right 1970's   rods and pins  . TOOTH EXTRACTION     "1 on the upper; wisdom tooth on the lower" (01/16/2018)  . TOTAL ABDOMINAL HYSTERECTOMY  1970's   w/ appendectomy    Family History  Problem Relation Age of Onset  . Heart disease Father        Heart Disease before age 66  . Diabetes Father   . Hyperlipidemia Father   . Hypertension Father   . Heart attack Father   . Deep vein thrombosis Father   . AAA (abdominal aortic aneurysm) Father   . Hypertension Mother   . Deep vein thrombosis Mother   . AAA (abdominal aortic aneurysm) Mother   . Cancer Sister  Ovarian  . Hypertension Sister   . Diabetes Paternal Grandmother   . Heart disease Paternal Uncle   . Diabetes Paternal Uncle   . Heart attack Paternal Grandfather 47       died of MI at 60  . Diabetes Paternal Aunt   . Diabetes Paternal Uncle   . Colon cancer Neg Hx     Social history:  reports that she has never smoked. She has never used smokeless tobacco. She reports that she does not drink alcohol or use drugs.    Allergies  Allergen Reactions  . Amoxicillin Shortness Of Breath and Rash  . Brilinta [Ticagrelor] Shortness Of Breath  . Erythromycin Shortness Of Breath, Other (See Comments) and Hives    Trouble swallowing  . Flagyl [Metronidazole] Shortness Of Breath and Palpitations  . Penicillins Hives, Shortness Of Breath, Rash and Other (See Comments)    Has patient had a PCN reaction causing immediate rash, facial/tongue/throat swelling, SOB or lightheadedness with hypotension: Yes Has patient had a PCN reaction causing severe rash involving mucus membranes or skin necrosis: No Has patient had a PCN reaction that required hospitalization: Yes Has patient had a PCN reaction occurring  within the last 10 years: No If all of the above answers are "NO", then may proceed with Cephalosporin use.   . Isosorbide Mononitrate [Isosorbide Nitrate] Other (See Comments)    Joint aches, muscles hurt, difficult to walk   . Jardiance [Empagliflozin] Other (See Comments)    Nausea, joint aches, muscles aches  . Metformin And Related Other (See Comments)    Stomach pain, cold sweats, joint pain, burred vision, dizziness  . Tape Other (See Comments)    Skin pulls off with certain types Plastic tape causes skin to rip if left on for long periods of time  . Erythromycin Base Rash    Medications:  Prior to Admission medications   Medication Sig Start Date End Date Taking? Authorizing Provider  acetaminophen (TYLENOL) 500 MG tablet Take 500 mg by mouth every 6 (six) hours as needed for mild pain, moderate pain, fever or headache.   Yes [provider]  albuterol (VENTOLIN HFA) 108 (90 Base) MCG/ACT inhaler Inhale 2 puffs into the lungs every 4 (four) hours as needed for wheezing or shortness of breath.    Yes [provider]  Alirocumab (PRALUENT) 150 MG/ML SOAJ Inject 150 mg/mL into the skin every 14 (fourteen) days.   Yes [provider]  ALPRAZolam Duanne Moron) 0.5 MG tablet Take 1-2 tablets (0.5-1 mg total) by mouth See admin instructions. Take one tablet (0.5 mg) by mouth every morning and two tablets (1 mg) at night, may also take 1 tablet (0.5 mg) midday as needed for anxiety 01/19/19  Yes Aline August, MD  amLODipine (NORVASC) 5 MG tablet TAKE ONE (1) TABLET BY MOUTH EVERY DAY 05/03/19  Yes Larey Dresser, MD  apixaban (ELIQUIS) 5 MG TABS tablet Take 1 tablet (5 mg total) by mouth 2 (two) times daily. 09/28/18  Yes Angiulli, Lavon Paganini, PA-C  bisacodyl (DULCOLAX) 5 MG EC tablet Take 1 tablet (5 mg total) by mouth daily as needed for moderate constipation. 05/19/18 05/19/19 Yes Nicholes Stairs, MD  buPROPion (WELLBUTRIN XL) 150 MG 24 hr tablet Take 150 mg by  mouth every morning. 01/25/19  Yes [provider]  clopidogrel (PLAVIX) 75 MG tablet Take 75 mg by mouth at bedtime.   Yes [provider]  denosumab (PROLIA) 60 MG/ML SOSY injection Inject 60 mg into  the skin every 6 (six) months.    Yes [provider]  DULoxetine (CYMBALTA) 30 MG capsule Take 1 capsule (30 mg total) by mouth daily. 09/28/18  Yes Angiulli, Lavon Paganini, PA-C  ezetimibe (ZETIA) 10 MG tablet Take 1 tablet (10 mg total) by mouth at bedtime. 04/10/18  Yes Cherene Altes, MD  Fluticasone-Salmeterol (ADVAIR) 250-50 MCG/DOSE AEPB Inhale 1 puff into the lungs daily.   Yes [provider]  gabapentin (NEURONTIN) 300 MG capsule Take 1-2 capsules (300-600 mg total) by mouth See admin instructions. 300mg  in the mornings and 600mg  at bedtime 01/19/19  Yes Aline August, MD  HYDROcodone-acetaminophen (NORCO/VICODIN) 5-325 MG tablet Take 1 tablet by mouth at bedtime as needed for moderate pain.   Yes [provider]  Insulin Glargine (LANTUS SOLOSTAR) 100 UNIT/ML Solostar Pen Inject 38-44 Units into the skin See admin instructions. Inject 38 units subcutaneously daily at bedtime, increase to 44 units for CBG >200   Yes [provider]  insulin lispro (HUMALOG KWIKPEN) 100 UNIT/ML KwikPen Inject 14-18 Units into the skin See admin instructions. Inject 14 units subcutaneously prior to breakfast and supper; add 4 units for CBG >200   Yes [provider]  ipratropium-albuterol (DUONEB) 0.5-2.5 (3) MG/3ML SOLN Take 3 mLs by nebulization 2 (two) times daily as needed (asthma). 01/19/19  Yes Aline August, MD  losartan (COZAAR) 25 MG tablet Take 12.5 mg by mouth daily.   Yes [provider]  metolazone (ZAROXOLYN) 2.5 MG tablet Take 1 tablet (2.5 mg total) by mouth daily as needed (As directed by heart failure clinic. Call heart failure office for direction when experiencing overnight weight gain). 12/26/18  Yes Geradine Girt, DO    Multiple Vitamin (MULTIVITAMIN WITH MINERALS) TABS tablet Take 1 tablet by mouth every morning. Centrum - Women over 68   Yes [provider]  Multiple Vitamins-Minerals ([place]) CAPS Take 1 capsule by mouth every morning.   Yes [provider]  nitroGLYCERIN (NITROSTAT) 0.3 MG SL tablet Place 1 tablet (0.3 mg total) under the tongue every 5 (five) minutes x 3 doses as needed for chest pain. 09/15/17  Yes Georgiana Shore, NP  ondansetron (ZOFRAN-ODT) 4 MG disintegrating tablet Take 4 mg by mouth 2 (two) times daily as needed for nausea or vomiting.   Yes [provider]  OXYGEN Inhale 2 L into the lungs at bedtime as needed (shortness of breath).    Yes [provider]  pantoprazole (PROTONIX) 40 MG tablet Take 1 tablet (40 mg total) by mouth daily. 09/28/18  Yes Angiulli, Lavon Paganini, PA-C  Polyvinyl Alcohol-Povidone (REFRESH OP) Place 1 drop into both eyes daily as needed (redness/ dry eyes).   Yes [provider]  potassium chloride SA (KLOR-CON) 20 MEQ tablet Take 40 mEq by mouth 2 (two) times daily.   Yes [provider]  Pyridoxine HCl (VITAMIN B-6 PO) Take 1 tablet by mouth every morning.   Yes [provider]  ranolazine (RANEXA) 1000 MG SR tablet Take 1 tablet (1,000 mg total) by mouth 2 (two) times daily. 01/29/19  Yes Larey Dresser, MD  rosuvastatin (CRESTOR) 40 MG tablet Take 1 tablet (40 mg total) by mouth at bedtime. 09/28/18  Yes Angiulli, Lavon Paganini, PA-C  spironolactone (ALDACTONE) 25 MG tablet Take 1 tablet (25 mg total) by mouth daily. 02/20/19  Yes Rosita Fire, Brittainy M, PA-C  torsemide (DEMADEX) 20 MG tablet Take 4 tablets (80 mg) by mouth every morning, take  2 tablets (40 mg) every evening 01/11/19  Yes Larey Dresser, MD  traZODone (DESYREL) 50 MG tablet TAKE 1 TABLET BY MOUTH AT BEDTIME 05/03/19  Yes Kathrynn Ducking, MD  triamcinolone cream (KENALOG) 0.1 % Apply 1 application topically daily as  needed (crusty spots on arms).   Yes [provider]  vitamin B-12 (CYANOCOBALAMIN) 1000 MCG tablet Take 1,000 mcg by mouth every morning.    Yes [provider]  zolpidem (AMBIEN) 10 MG tablet Take 0.5 tablets (5 mg total) by mouth at bedtime. 12/26/18  Yes Vann, Jessica U, DO    ROS:  Out of a complete 14 system review of symptoms, the patient complains only of the following symptoms, and all other reviewed systems are negative.  Walking difficulty Shortness of breath Left foot pain  Blood pressure (!) 90/57, pulse 71, temperature (!) 97.2 F (36.2 C), height 5\' 2"  (1.575 m), weight 224 lb (101.6 kg).  Physical Exam  General: The patient is alert and cooperative at the time of the examination.  The patient is markedly obese.  Skin: 1-2+ edema below the knees is seen bilaterally.   Neurologic Exam  Mental status: The patient is alert and oriented x 3 at the time of the examination. The patient has apparent normal recent and remote memory, with an apparently normal attention span and concentration ability.   Cranial nerves: Facial symmetry is present. Speech is normal, no aphasia or dysarthria is noted. Extraocular movements are full. Visual fields are full.  Overall, speech is normal, occasionally, she will get into a short duration of stuttering only to return to completely normal speech.  Motor: The patient has good strength in all 4 extremities.  Sensory examination: Soft touch sensation is symmetric on the face, arms, and legs.  Soft touch sensation in the lower extremities is decreased bilaterally.  Coordination: The patient has good finger-nose-finger and heel-to-shin bilaterally.  Gait and station: The patient is able to walk with the examiner the few steps, she has a wide-based gait that is also associated with a limping quality on the left leg.  Tandem gait was not attempted.  Romberg is negative but is unsteady.  Reflexes: Deep tendon reflexes are  symmetric, but are depressed.   Assessment/Plan:  1.  Diabetic peripheral neuropathy  2.  Gait disorder  3.  Stuttering speech, nonorganic  4.  History of cerebrovascular disease  The patient is having occasional episodes of hallucinations coming out of sleep, I would not treat this issue.  The stuttering speech pattern clearly is improved from last evaluation and likely is nonorganic.  The patient does have a significant gait disorder, her most recent fall was associated with trying to balance on one leg, the patient needs to utilize all safety measures to prevent falls.  She is getting physical therapy currently.  She will follow-up here in 6 months.  She will continue on trazodone for sleep.  Greater than 50% of the visit was spent in counseling and coordination of care.  Face-to-face time with the patient was 20 minutes.   Jill Alexanders MD 05/17/2019 12:29 PM  [facility] [address] [place], [place] [address]  Phone [phone] Fax [phone]

## 2019-05-18 NOTE — Telephone Encounter (Signed)
Patient aware and voiced understanding  Patient reports she saw PCP  virtually and is treated for pneumonia will have in person  recheck 4/16  Advised to be sure to keep follow up with PCP, take increased dose of torsemide as ordered and return call on Monday with update if needed

## 2019-05-21 ENCOUNTER — Ambulatory Visit: Payer: Medicare Other

## 2019-05-24 ENCOUNTER — Encounter: Payer: Medicare Other | Admitting: Physical Therapy

## 2019-06-05 ENCOUNTER — Ambulatory Visit: Payer: Medicare Other | Attending: Cardiology | Admitting: Physical Therapy

## 2019-06-05 ENCOUNTER — Encounter: Payer: Self-pay | Admitting: Physical Therapy

## 2019-06-05 ENCOUNTER — Other Ambulatory Visit: Payer: Self-pay

## 2019-06-05 VITALS — BP 122/62 | HR 68

## 2019-06-05 DIAGNOSIS — M6281 Muscle weakness (generalized): Secondary | ICD-10-CM | POA: Diagnosis not present

## 2019-06-05 DIAGNOSIS — R2689 Other abnormalities of gait and mobility: Secondary | ICD-10-CM | POA: Diagnosis present

## 2019-06-05 DIAGNOSIS — R262 Difficulty in walking, not elsewhere classified: Secondary | ICD-10-CM | POA: Diagnosis present

## 2019-06-05 DIAGNOSIS — R2681 Unsteadiness on feet: Secondary | ICD-10-CM | POA: Diagnosis present

## 2019-06-05 NOTE — Therapy (Signed)
[facility] [address]  [place], [place], [address] Phone: [phone]   Fax:  [phone]  Physical Therapy Treatment  Patient Details  Name: [name] [name] [name]: [id number] Date of Birth: 10-01-1945 Referring Provider (PT): Loralie Champagne, MD   Encounter Date: 06/05/2019  PT End of Session - 06/05/19 1810    Visit Number  12    Number of Visits  24    Date for PT Re-Evaluation  07/17/19    Authorization Type  Medicare & AARP    PT Start Time  3790    PT Stop Time  1444    PT Time Calculation (min)  46 min    Equipment Utilized During Treatment  Gait belt    Activity Tolerance  Patient tolerated treatment well;Patient limited by fatigue    Behavior During Therapy  [facility] for tasks assessed/performed       Past Medical History:  Diagnosis Date  . Anemia    hx  . Anxiety   . Asthma   . Basal cell carcinoma 05/2014   "left shoulder"  . Bundle branch block, left    chronic/notes 07/18/2013  . CHF (congestive heart failure) ([place])   . Chronic insomnia 05/06/2015  . Chronic kidney disease    frequency, sees dr Jamal Maes every 4 to 6 months (01/16/2018)  . Chronic lower back pain   . Claustrophobia   . Common migraine 05/14/2014  . Coronary artery disease    MI in 2001, 2002, 2006, 2011, 2014  . Depression   . Diabetic peripheral neuropathy ([place]) 01/12/2019  . GERD (gastroesophageal reflux disease)   . H/O hiatal hernia   . Headache    "at least 2/month" (01/16/2018)  . Heart murmur   . Hyperlipidemia   . Hypertension   . Memory change 05/14/2014  . Migraine    "5-6/year"  (01/16/2018)  . Obesity 01-2010  . Obstructive sleep apnea    "can't wear machine; I have claustrophobia" (01/16/2018), states she had a 2nd sleep study and does not have sleep apnea, her O2 decreases and now is on 2 L of O2 at night.  . On home oxygen therapy    "2L at night and prn during daytime" (01/16/2018)  . Osteoarthritis     "knees and hands" (01/16/2018)  . Peripheral vascular disease (HCC)    ? numbness, tingling arms and legs  . PONV (postoperative nausea and vomiting)   . Stroke [facility])    2014, 2015, 2016  . Type II diabetes mellitus ([place])   . Ventral hernia    hx of    Past Surgical History:  Procedure Laterality Date  . ANKLE FRACTURE SURGERY Left 1970's  . APPENDECTOMY  1970's   w/hysterectomy  . BASAL CELL CARCINOMA EXCISION Left 05/2014   "shoulder" (01/16/2018)  . CARDIAC CATHETERIZATION  10/10/2012   Dr [name].  Marland Kitchen CARDIAC CATHETERIZATION N/A 05/29/2015   Procedure: Right/Left Heart Cath and Coronary/Graft Angiography;  Surgeon: Larey Dresser, MD;  Location: [place] CV LAB;  Service: Cardiovascular;  Laterality: N/A;  . CAROTID ENDARTERECTOMY Left 03/2013  . CAROTID STENT INSERTION Left 03/20/2013   Procedure: CAROTID STENT INSERTION;  Surgeon: [name], MD;  Location[facility] CATH LAB;  Service: Cardiovascular;  Laterality: Left;  internal carotid  . CEREBRAL ANGIOGRAM N/A 04/05/2011   Procedure: CEREBRAL ANGIOGRAM;  Surgeon: Angelia Mould, MD;  Location: [facility] CATH LAB;  Service: Cardiovascular;  Laterality: N/A;  . CHOLECYSTECTOMY  OPEN  2004  . CORONARY ANGIOPLASTY WITH STENT PLACEMENT  01,02,05,06,07,08,11; 04/24/2013   "I've probably got ~ 10 stents by now" (04/24/2013)  . CORONARY ANGIOPLASTY WITH STENT PLACEMENT  06/13/2013   "got 4 stents today" (06/13/2013)  . CORONARY ARTERY BYPASS GRAFT  1220/11   "CABG X5"  . CORONARY STENT INTERVENTION N/A 10/20/2017   Procedure: CORONARY STENT INTERVENTION;  Surgeon: Troy Sine, MD;  Location: [place] CV LAB;  Service: Cardiovascular;  Laterality: N/A;  . ESOPHAGOGASTRODUODENOSCOPY  08/03/2011   Procedure: ESOPHAGOGASTRODUODENOSCOPY (EGD);  Surgeon: Shann Medal, MD;  Location: [facility];  Service: General;  Laterality: N/A;  . ESOPHAGOGASTRODUODENOSCOPY (EGD) WITH PROPOFOL N/A 03/11/2014   Procedure: ESOPHAGOGASTRODUODENOSCOPY  (EGD) WITH PROPOFOL;  Surgeon: Lafayette Dragon, MD;  Location: [facility] ENDOSCOPY;  Service: Endoscopy;  Laterality: N/A;  . FRACTURE SURGERY    . gall stone removal  05/2003  . GASTRIC RESTRICTION SURGERY  1984   "stapeling"  . HERNIA REPAIR  2004   "in my stomach; had OR on it twice", wire mesh on 1 hernia  . LEFT HEART CATH AND CORS/GRAFTS ANGIOGRAPHY N/A 07/09/2016   Procedure: LEFT HEART CATH AND CORS/GRAFTS ANGIOGRAPHY;  Surgeon: Larey Dresser, MD;  Location: [place] CV LAB;  Service: Cardiovascular;  Laterality: N/A;  . ORIF ANKLE FRACTURE Left 05/16/2018  . ORIF ANKLE FRACTURE Left 05/16/2018   Procedure: OPEN REDUCTION INTERNAL FIXATION (ORIF) Left ankle with possible syndesmosis fixation;  Surgeon: Nicholes Stairs, MD;  Location: [place];  Service: Orthopedics;  Laterality: Left;  168mn  . OVARY SURGERY  1970's   "tumor removed"  . PERCUTANEOUS CORONARY STENT INTERVENTION (PCI-S) N/A 06/13/2013   Procedure: PERCUTANEOUS CORONARY STENT INTERVENTION (PCI-S);  Surgeon: JJettie Booze MD;  [facility];  Service: Cardiovascular;  Laterality: N/A;  . PERCUTANEOUS STENT INTERVENTION N/A 04/24/2013   Procedure: PERCUTANEOUS STENT INTERVENTION;  Surgeon: JJettie Booze MD;  [facility];  Service: Cardiovascular;  Laterality: N/A;  . RIGHT/LEFT HEART CATH AND CORONARY/GRAFT ANGIOGRAPHY N/A 10/20/2017   Procedure: RIGHT/LEFT HEART CATH AND CORONARY/GRAFT ANGIOGRAPHY;  Surgeon: MLarey Dresser MD;  [facility];  Service: Cardiovascular;  Laterality: N/A;  . ROOT CANAL  10/2000  . TEE WITHOUT CARDIOVERSION N/A 01/18/2019   Procedure: TRANSESOPHAGEAL ECHOCARDIOGRAM (TEE);  Surgeon: MLarey Dresser MD;  [facility];  Service: Cardiovascular;  Laterality: N/A;  . TIBIA FRACTURE SURGERY Right 1970's   rods and pins  . TOOTH EXTRACTION     "1 on the upper; wisdom tooth on the lower" (01/16/2018)  . TOTAL ABDOMINAL HYSTERECTOMY  1970's    w/ appendectomy    Vitals:   06/05/19 1406  BP: 122/62  Pulse: 68  SpO2: 93%    Subjective Assessment - 06/05/19 1400    Subjective  Feels like she has taken a step back since getting Pneumonia. Has a repeat chest xray later this week or early next week. Denies recent falls but did have to lower herself onto the floor d/t dizziness last Monday, Denies injuries.    Pertinent History  DM II, stroke 2014, 2015, 2016, PVD, supplemental O2 use, migraine, memory change, HTN, HLD, HA, hiatal hernia, GERD, peripheral neuropathy, depression, CAD, claustrophobia, CHF, L BBB, asthma, anxiety, anemia, L ankle ORIF 05/2018, CABG x5 2011    Diagnostic tests  none recent    Patient Stated Goals  "get to walking again and get control of my fatigue"    Currently in  Pain?  No/denies         [facility] PT Assessment - 06/05/19 0001      Assessment   Medical Diagnosis  Generalized Weakness    Referring Provider (PT)  Loralie Champagne, MD    Onset Date/Surgical Date  03/04/18      Strength   Right Hip Flexion  4/5    Right Hip ABduction  4/5    Right Hip ADduction  4+/5    Left Hip Flexion  4+/5    Left Hip ABduction  4/5    Left Hip ADduction  4+/5    Right Knee Flexion  4/5    Right Knee Extension  4+/5    Left Knee Flexion  4/5    Left Knee Extension  4+/5    Right Ankle Dorsiflexion  4/5    Right Ankle Plantar Flexion  4/5    Left Ankle Dorsiflexion  4+/5    Left Ankle Plantar Flexion  4+/5      Standardized Balance Assessment   Five times sit to stand comments   21.5 sec pushing off B knees, with last rep pushing off armrests      Timed Up and Go Test   Normal TUG (seconds)  26.94                   [facility] Adult PT Treatment/Exercise - 06/05/19 0001      Lumbar Exercises: Seated   Other Seated Lumbar Exercises  row with red TB x10   good form; quick to fatigue   Other Seated Lumbar Exercises  sitting diapragmatic breathing x6, 3 sec inhale, 6 sec exhale      Knee/Hip  Exercises: Seated   Clamshell with TheraBand  Green   x10   Hamstring Curl  Strengthening;Right;Left;1 set;10 reps    Hamstring Limitations  red TB             PT Education - 06/05/19 1810    Education Details  prognosis, POC, update to HEP    Person(s) Educated  Patient    Methods  Explanation;Demonstration;Tactile cues;Verbal cues;Handout    Comprehension  Verbalized understanding;Returned demonstration       PT Short Term Goals - 06/05/19 1403      PT SHORT TERM GOAL #1   Title  Patient to be independent with initial HEP.    Time  3    Period  Weeks    Status  Achieved    Target Date  04/25/19        PT Long Term Goals - 06/05/19 1403      PT LONG TERM GOAL #1   Title  Patient to be independent with advanced HEP.    Time  6    Period  Weeks    Status  Partially Met   recently limited by feeling unwell   Target Date  07/17/19      PT LONG TERM GOAL #2   Title  Patient to demonstrate B LE strength >/=4+/5.    Time  6    Period  Weeks    Status  Partially Met   slight decline in B hip abduction, R hip flexion, L knee flexion, and R ankle DF/PF   Target Date  07/17/19      PT LONG TERM GOAL #3   Title  Patient to score <14 sec on TUG testing in order to decrease risk of falls.    Baseline  38.34 sec with RW  Time  6    Period  Weeks    Status  Partially Met   06/05/19 26.94 sec with RW   Target Date  07/17/19      PT LONG TERM GOAL #4   Title  Patient to score <12 sec on 5xSTS in order to decrease risk of falls.    Baseline  28.29 sec pushing off from B armrests    Time  6    Period  Weeks    Status  Partially Met   06/05/19 21.5 sec pushing off from B knees with last rep requiring push off from armrests   Target Date  07/17/19      PT LONG TERM GOAL #5   Title  Patient to report tolerance for 15 min of walking with LRAD without fatigue limiting.    Time  6    Period  Weeks    Status  On-going   reports limited to 3-4 minutes before fatigue    Target Date  07/17/19            Plan - 06/05/19 1811    Clinical Impression Statement  Patient returns to therapy after a hiatus from therapy d/t pneumonia. Denies recent falls but did have to lower herself onto the floor d/t dizziness last Monday. Denies injuries from this episode. Notes that she has not been able to perform HEP during this hiatus from therapy d/t not feeling well. Vitals were normal at beginning of session and were monitored throughout. Patient notes that she is now limited to 3-4 minutes of walking before onset of fatigue. Demonstrated a slight increase in time with TUG and 5xSTS, however today's score still much improved from patient's baseline measurements. Worked on sitting LE strengthening exercises as well as diaphragmatic breathing to address patient's remaining impairments. Updated exercises that were well-tolerated today. Patient reported understanding and without complaints at end of session. Patient would benefit from additional skilled PT services 2x/week for 6 weeks to address remaining goals.    Comorbidities  DM II, stroke 2014, 2015, 2016, PVD, supplemental O2 use, migraine, memory change, HTN, HLD, HA, hiatal hernia, GERD, peripheral neuropathy, depression, CAD, claustrophobia, CHF, L BBB, asthma, anxiety, anemia, L ankle ORIF 05/2018, CABG x5 2011    Rehab Potential  Good    PT Frequency  2x / week    PT Duration  6 weeks    PT Treatment/Interventions  ADLs/Self Care Home Management;Cryotherapy;Electrical Stimulation;Moist Heat;Balance training;Therapeutic exercise;Therapeutic activities;Functional mobility training;Stair training;Gait training;Ultrasound;Neuromuscular re-education;Patient/family education;Manual techniques;Taping;Energy conservation;Dry needling;Passive range of motion    PT Next Visit Plan  LE strengthening; gait training for improved RW safety    Consulted and Agree with Plan of Care  Patient       Patient will benefit from skilled  therapeutic intervention in order to improve the following deficits and impairments:  Abnormal gait, Decreased endurance, Cardiopulmonary status limiting activity, Decreased activity tolerance, Decreased strength, Pain, Decreased balance, Decreased mobility, Difficulty walking, Improper body mechanics, Decreased range of motion, Impaired flexibility, Postural dysfunction  Visit Diagnosis: Muscle weakness (generalized)  Unsteadiness on feet  Other abnormalities of gait and mobility  Difficulty in walking, not elsewhere classified     Problem List Patient Active Problem List   Diagnosis Date Noted  . Symptomatic bradycardia 01/16/2019  . Normocytic anemia 01/16/2019  . Diabetic peripheral neuropathy ([place]) 01/12/2019  . Neurologic abnormality 12/25/2018  . Debility 09/21/2018  . Hypotension 09/18/2018  . Bradycardia 09/18/2018  . AKI (acute kidney injury) ([place]) 09/18/2018  .  Pressure injury of skin 09/18/2018  . Ischemic cerebrovascular accident (CVA) of frontal lobe ([place]) 09/13/2018  . Fall 09/09/2018  . History of CVA (cerebrovascular accident) 09/09/2018  . Type II diabetes mellitus with renal manifestations ([place]) 09/09/2018  . CKD (chronic kidney disease), stage III 09/09/2018  . Sepsis ([place]) 09/09/2018  . Depression with anxiety 09/09/2018  . Slurred speech 09/09/2018  . Ankle fracture, left 05/16/2018  . Amiodarone pulmonary toxicity   . Physical deconditioning   . 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 Acute on chronic congestive heart failure ([place])   . Chronic combined systolic (congestive) and diastolic (congestive) heart failure ([place]) 01/16/2018  . Chronic systolic CHF (congestive heart failure) ([place]) 01/16/2018  . Atrial fibrillation ([place]) 10/21/2017  . CAD (coronary artery disease) of bypass graft 10/20/2017  . Coronary artery disease of bypass graft of native heart with stable angina pectoris ([place])   . Chronic low back pain 08/24/2016  . Pain of left thumb 08/05/2016   . Nocturnal hypoxia 06/02/2016  . Hoarseness 04/05/2016  . Laryngopharyngeal reflux (LPR) 04/05/2016  . Pharyngoesophageal dysphagia 04/05/2016  . Angina decubitus ([place]) 05/22/2015  . Chronic insomnia 05/06/2015  . Common migraine 05/14/2014  . Abnormality of gait 05/14/2014  . Memory change 05/14/2014  . Aneurysm, cerebral, nonruptured 05/14/2014  . Cramp of limb-Left neck 05/10/2014  . Hematemesis with nausea   . Vomiting blood   . Dizziness and giddiness 09/21/2013  . Atypical chest pain 07/18/2013  . Unstable angina ([place]) 04/09/2013  . Carotid artery stenosis, symptomatic 03/20/2013  . Cerebrovascular disease 07/10/2012  . Cerebral artery occlusion with cerebral infarction ([place]) 07/10/2012  . Mitral regurgitation 04/15/2012  . TIA (transient ischemic attack) 04/15/2012  . Occlusion and stenosis of carotid artery without mention of cerebral infarction 08/18/2011  . Bariatric surgery status 06/17/2011  . Speech abnormality 03/22/2011  . Dyspnea 02/24/2011  . PAPILLARY MUSCLE DYSFUNCTION, NON-RHEUMATIC 10/09/2008  . UNSPECIFIED VITAMIN D DEFICIENCY 10/24/2007  . MYOCARDIAL INFARCTION, HX OF 10/24/2007  . PERSISTENT VOMITING 10/24/2007  . OSTEOARTHRITIS 10/24/2007  . MIGRAINES, HX OF 10/24/2007  . Diabetes mellitus type 2, controlled, with complications ([place]) 92/02/69  . Hyperlipemia 01/16/2007  . Obesity-post failed open gastroplasty 1984  01/16/2007  . OBSTRUCTIVE SLEEP APNEA 01/16/2007  . Essential hypertension 01/16/2007  . Coronary atherosclerosis 01/16/2007  . Asthma 01/16/2007  . GERD 01/16/2007  . VENTRAL HERNIA 01/16/2007     Janene Harvey, PT, DPT 06/05/19 6:18 PM   [facility] [address]  [place], [place], [address] Phone: [phone]   Fax:  [phone]  Name: [name] [name] [name]: [id number] Date of Birth: [birth year]

## 2019-06-06 ENCOUNTER — Encounter (HOSPITAL_COMMUNITY): Payer: Self-pay | Admitting: Cardiology

## 2019-06-06 ENCOUNTER — Ambulatory Visit (HOSPITAL_COMMUNITY)
Admission: RE | Admit: 2019-06-06 | Discharge: 2019-06-06 | Disposition: A | Discharge status: Home / Self Care | Payer: Medicare Other | Source: Ambulatory Visit | Attending: Cardiology | Admitting: Cardiology

## 2019-06-06 ENCOUNTER — Ambulatory Visit
Admission: RE | Admit: 2019-06-06 | Discharge: 2019-06-06 | Disposition: A | Discharge status: Home / Self Care | Payer: Medicare Other | Source: Ambulatory Visit | Attending: Family Medicine | Admitting: Family Medicine

## 2019-06-06 ENCOUNTER — Other Ambulatory Visit: Payer: Self-pay | Admitting: Family Medicine

## 2019-06-06 VITALS — BP 147/45 | HR 68 | Wt 221.2 lb

## 2019-06-06 DIAGNOSIS — N189 Chronic kidney disease, unspecified: Secondary | ICD-10-CM | POA: Diagnosis not present

## 2019-06-06 DIAGNOSIS — Z881 Allergy status to other antibiotic agents status: Secondary | ICD-10-CM | POA: Insufficient documentation

## 2019-06-06 DIAGNOSIS — I480 Paroxysmal atrial fibrillation: Secondary | ICD-10-CM | POA: Insufficient documentation

## 2019-06-06 DIAGNOSIS — F419 Anxiety disorder, unspecified: Secondary | ICD-10-CM | POA: Insufficient documentation

## 2019-06-06 DIAGNOSIS — I2510 Atherosclerotic heart disease of native coronary artery without angina pectoris: Secondary | ICD-10-CM | POA: Insufficient documentation

## 2019-06-06 DIAGNOSIS — I130 Hypertensive heart and chronic kidney disease with heart failure and stage 1 through stage 4 chronic kidney disease, or unspecified chronic kidney disease: Secondary | ICD-10-CM | POA: Diagnosis not present

## 2019-06-06 DIAGNOSIS — R05 Cough: Secondary | ICD-10-CM

## 2019-06-06 DIAGNOSIS — Z7951 Long term (current) use of inhaled steroids: Secondary | ICD-10-CM | POA: Insufficient documentation

## 2019-06-06 DIAGNOSIS — I25119 Atherosclerotic heart disease of native coronary artery with unspecified angina pectoris: Secondary | ICD-10-CM

## 2019-06-06 DIAGNOSIS — I5042 Chronic combined systolic (congestive) and diastolic (congestive) heart failure: Secondary | ICD-10-CM

## 2019-06-06 DIAGNOSIS — Z955 Presence of coronary angioplasty implant and graft: Secondary | ICD-10-CM | POA: Diagnosis not present

## 2019-06-06 DIAGNOSIS — Z09 Encounter for follow-up examination after completed treatment for conditions other than malignant neoplasm: Secondary | ICD-10-CM

## 2019-06-06 DIAGNOSIS — Z833 Family history of diabetes mellitus: Secondary | ICD-10-CM | POA: Insufficient documentation

## 2019-06-06 DIAGNOSIS — K219 Gastro-esophageal reflux disease without esophagitis: Secondary | ICD-10-CM | POA: Insufficient documentation

## 2019-06-06 DIAGNOSIS — Z85820 Personal history of malignant melanoma of skin: Secondary | ICD-10-CM | POA: Insufficient documentation

## 2019-06-06 DIAGNOSIS — Z8673 Personal history of transient ischemic attack (TIA), and cerebral infarction without residual deficits: Secondary | ICD-10-CM | POA: Diagnosis not present

## 2019-06-06 DIAGNOSIS — I340 Nonrheumatic mitral (valve) insufficiency: Secondary | ICD-10-CM | POA: Diagnosis not present

## 2019-06-06 DIAGNOSIS — Z7901 Long term (current) use of anticoagulants: Secondary | ICD-10-CM | POA: Diagnosis not present

## 2019-06-06 DIAGNOSIS — K3184 Gastroparesis: Secondary | ICD-10-CM | POA: Insufficient documentation

## 2019-06-06 DIAGNOSIS — Z79899 Other long term (current) drug therapy: Secondary | ICD-10-CM | POA: Insufficient documentation

## 2019-06-06 DIAGNOSIS — E1122 Type 2 diabetes mellitus with diabetic chronic kidney disease: Secondary | ICD-10-CM | POA: Diagnosis not present

## 2019-06-06 DIAGNOSIS — M1990 Unspecified osteoarthritis, unspecified site: Secondary | ICD-10-CM | POA: Insufficient documentation

## 2019-06-06 DIAGNOSIS — Z794 Long term (current) use of insulin: Secondary | ICD-10-CM | POA: Insufficient documentation

## 2019-06-06 DIAGNOSIS — Z7902 Long term (current) use of antithrombotics/antiplatelets: Secondary | ICD-10-CM | POA: Insufficient documentation

## 2019-06-06 DIAGNOSIS — Z880 Allergy status to penicillin: Secondary | ICD-10-CM | POA: Insufficient documentation

## 2019-06-06 DIAGNOSIS — E1143 Type 2 diabetes mellitus with diabetic autonomic (poly)neuropathy: Secondary | ICD-10-CM | POA: Diagnosis not present

## 2019-06-06 DIAGNOSIS — Z6841 Body Mass Index (BMI) 40.0 and over, adult: Secondary | ICD-10-CM | POA: Diagnosis not present

## 2019-06-06 DIAGNOSIS — E785 Hyperlipidemia, unspecified: Secondary | ICD-10-CM | POA: Insufficient documentation

## 2019-06-06 DIAGNOSIS — R059 Cough, unspecified: Secondary | ICD-10-CM

## 2019-06-06 DIAGNOSIS — E662 Morbid (severe) obesity with alveolar hypoventilation: Secondary | ICD-10-CM | POA: Diagnosis not present

## 2019-06-06 DIAGNOSIS — R001 Bradycardia, unspecified: Secondary | ICD-10-CM | POA: Insufficient documentation

## 2019-06-06 DIAGNOSIS — Z8349 Family history of other endocrine, nutritional and metabolic diseases: Secondary | ICD-10-CM | POA: Insufficient documentation

## 2019-06-06 DIAGNOSIS — I5022 Chronic systolic (congestive) heart failure: Secondary | ICD-10-CM | POA: Diagnosis not present

## 2019-06-06 DIAGNOSIS — Z951 Presence of aortocoronary bypass graft: Secondary | ICD-10-CM | POA: Insufficient documentation

## 2019-06-06 DIAGNOSIS — Z888 Allergy status to other drugs, medicaments and biological substances status: Secondary | ICD-10-CM | POA: Insufficient documentation

## 2019-06-06 DIAGNOSIS — I447 Left bundle-branch block, unspecified: Secondary | ICD-10-CM | POA: Diagnosis not present

## 2019-06-06 DIAGNOSIS — Z8249 Family history of ischemic heart disease and other diseases of the circulatory system: Secondary | ICD-10-CM | POA: Insufficient documentation

## 2019-06-06 LAB — LIPID PANEL
Cholesterol: 131 mg/dL (ref 0–200)
HDL: 57 mg/dL (ref >40)
LDL Cholesterol: 57 mg/dL (ref 0–99)
Total CHOL/HDL Ratio: 2.3 RATIO
Triglycerides: 83 mg/dL (ref <150)
VLDL: 17 mg/dL (ref 0–40)

## 2019-06-06 LAB — CBC
HCT: 37.9 % (ref 36.0–46.0)
Hemoglobin: 11.5 g/dL — ABNORMAL LOW (ref 12.0–15.0)
MCH: 26.6 pg (ref 26.0–34.0)
MCHC: 30.3 g/dL (ref 30.0–36.0)
MCV: 87.7 fL (ref 80.0–100.0)
Platelets: 239 10*3/uL (ref 150–400)
RBC: 4.32 MIL/uL (ref 3.87–5.11)
RDW: 17 % — ABNORMAL HIGH (ref 11.5–15.5)
WBC: 8 10*3/uL (ref 4.0–10.5)
nRBC: 0 % (ref 0.0–0.2)

## 2019-06-06 LAB — BASIC METABOLIC PANEL
Anion gap: 11 (ref 5–15)
BUN: 13 mg/dL (ref 8–23)
CO2: 26 mmol/L (ref 22–32)
Calcium: 9.4 mg/dL (ref 8.9–10.3)
Chloride: 101 mmol/L (ref 98–111)
Creatinine, Ser: 1.1 mg/dL — ABNORMAL HIGH (ref 0.44–1.00)
GFR calc Af Amer: 58 mL/min — ABNORMAL LOW (ref >60)
GFR calc non Af Amer: 50 mL/min — ABNORMAL LOW (ref >60)
Glucose, Bld: 159 mg/dL — ABNORMAL HIGH (ref 70–99)
Potassium: 4.2 mmol/L (ref 3.5–5.1)
Sodium: 138 mmol/L (ref 135–145)

## 2019-06-06 MED ORDER — AMLODIPINE BESYLATE 10 MG PO TABS: 10.0000 mg | ORAL_TABLET | Freq: Every day | ORAL | 5 refills | Status: DC

## 2019-06-06 NOTE — Patient Instructions (Signed)
INCREASE Amlodipine to 10mg  (1 tab)  Labs today We will only contact you if something comes back abnormal or we need to make some changes. Otherwise no news is good news!  Your physician recommends that you schedule a follow-up appointment in: 3 months with Dr [name]  Please call office at [phone] option 2 if you have any questions or concerns.   At the [facility], you and your health needs are our priority. As part of our continuing mission to provide you with exceptional heart care, we have created designated Provider Care Teams. These Care Teams include your primary Cardiologist (physician) and Advanced Practice Providers (APPs- Physician Assistants and Nurse Practitioners) who all work together to provide you with the care you need, when you need it.   You may see any of the following providers on your designated Care Team at your next follow up: Marland Kitchen Dr Glori Bickers . Dr Loralie Champagne . Darrick Grinder, NP . Lyda Jester, PA . Audry Riles, PharmD   Please be sure to bring in all your medications bottles to every appointment.

## 2019-06-07 NOTE — Progress Notes (Signed)
Date:  06/07/2019   ID:  [name] [name], DOB 1945/02/14, [name] [id number]   Provider location: [facility] Type of Visit: Established patient   PCP:  [name] Late, MD  Cardiologist:  Dr. Aundra [name]  Chief Complaint: Exertional shortness of breath   History of Present Illness: [name] [name] is a 74 y.o. female who has a history of CAD s/p CABG, diastolic CHF, and cerebrovascular disease with history of CVA presents for followup of CHF and diastolic CHF. She had CABG x 5 in 12/11.  Prior to the CABG she had multiple PCIs.  She had a CVA in 2/14 that presented as visual loss.  She had a left carotid stent in 2/15.   Left heart cath done in Sept. 2014. This showed patent SVG-D, LIMA-LAD, and sequential SVG-OM branches but SVG-RCA and the native RCA were both totally occluded. It was felt aht her increased symptoms coincided with occlusion of SVG-RCA.  She was started on Imdur to see if this would help with the chest pain and dyspnea.  However, she feels like Imdur causes leg cramps and does not think that she can take it.  So ranolazine 500 mg bid started and titrated this up to 1000 mg bid.  This helped some but not markedly.  Therefore, sent to  Dr. Irish [name] to address opening RCA CTO. He was able to do this in 5/15 with 4 overlapping Xience DES.  This led to resolution of exertional chest pain.    [name] [name] was started on Brilinta after PCI.  She became much more short of breath after starting on Brilinta. Per Dr [name] [name] stopped and replaced with Plavix.  Dyspnea improved off Brilinta. Given increasing exertional dyspnea and chest pain,  RHC/LHC was done in 4/17.  This showed stable CAD with no interventional target.  Left and right heart filling pressures were not significantly increased.  Medical management.   She had an MRI/MRA in May 2018 that showed moderately severe stenosis of the supraclinoid segment of the right internal carotid artery, progressed since  the 2016 MR angiogram and severe basilar artery stenosis.   She developed recurrent exertional chest pain and had LHC in 6/18, showing 4/5 grafts patent with patent native RCA (similar to prior cath, no changes).    Echo 1/19 with EF 55-60%, moderate diastolic dysfunction, PASP 50 mmHg, mildly dilated RV, mild AS, mild-moderate MR.   She reported increased dyspnea and chest pain and had RHC/LHC in 9/19.  She had DES to sequential SVG-OM1/PLOM.  While hospitalized, she was noted to have runs of atrial fibrillation.  She was very symptomatic with the atrial fibrillation.  Eliquis and amiodarone were started.   She was admitted 12/16-12/19/19 for cardiomems implant and RHC. She was diuresed with IV lasix and transitioned to torsemide 100 mg BID + metolazone once/week. DC weight: 295 lbs.  She had a prolonged hospitalization in 2/20 with acute respiratory failure and fever to 103.  She was thought to have PNA and treated with cefepime.  She was intubated.  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  Amiodarone was stopped and she was put on prednisone. Echo in 2/20 showed EF down to 25-30% with mid-apical LV severe hypokinesis. She had AKI. Possible ITP triggered by infection, HIT negative.  ITP was treated with Solumedrol. She was discharged to SNF.    ORIF left ankle 4/20.   11/20 admission initially with concern for TIAs (staring spells),  ended up thinking possible partial seizures.  Head MRI did not show CVA.  Echo in 11/20 showed EF 45-50%, moderately decreased RV systolic function, moderate RV enlargement, moderate-severe MR, moderate TR.   Patient was admitted again in 12/20 with AKI and hypotension in the setting of sinus bradycardia (HR 30s).  She was volume overloaded on exam.  Toprol XL was stopped and HR increased to 60s.  She was diuresed with IV Lasix.  TEE in 12/20 showed EF 50-55%, septal-lateral dyssynchrony, mildly decreased RV function, moderate MR.    Atypical chest pain in 1/21, Cardiolite showed EF 57%, fixed inferior defect (likely artifact), no ischemia.   She returns today for followup of CHF and CAD.  She had pneumonia about 3 wks ago, treated at home with antibiotics.  She continues to lose weight (down 4 lbs since last appointment).  She walks around her house with her walker, she does get mildly short of breath walking around the house.  No BRBPR/melena.  She has on and off chest heaviness, can last all day.  No trigger.  Poor energy in general.  Sleeps with head inclined.  Doing PT twice a week. BP has been high.   Cardiomems: PADP 15  ECG (personally reviewed): NSR, LBBB 176 msec  Labs (6/14): K 3.8, creatinine 0.71 Labs (8/14): BNP 249, LDL 166 Labs (9/14): K 4.7, creatinine 0.7 Labs (9/14): K 4.6, creatinine 0.9, BNP 109 Labs (1/15): K 4.5, creatinine 0.73, LDL 212, HCT 43.4, TSH normal, BNP 138 Labs (6/15): K 4.7, creatinine 1.17, LDL 100, HDL 37, TGs 363, BNP 39 Labs (10/15): K 4.6, creatinine 1.2 Labs (1/16): LDL 157, HDL 43, K 5.2, creatinine 0.95 Labs (2/16): K 4.5, creatinine 1.08, BNP 113 Labs (4/16): LDL 50, HDL 56, TGs 179 Labs (9/16): K 5.3, creatinine 1.09 Labs (10/16): LDL 75, HDL 56 Labs (2/17): K 5.2, creatinine 1.07 Labs (4/17): K 5.1, creatinine 1.07 Labs (5/17): K 4.5, creatinine 1.01, LDL 96, HDL 56, BNP 70 Labs (03/2016) K 4.9, creatinine 1.07 Labs (7/18): K 4.9, creatinine 1.15 Labs (11/18): K 5.1, creatinine 1.11, LDL 36 Labs (1/19): K 4.9, creatinine 1.22 Labs (9/19): LDL 108 Labs (10/19): hgb 14.5, K 4.3, creatinine 1.03 Labs 12/06/2017: K 4.7 Creatinine 1.7 Labs (3/20): K 4.8, creatinine 1.39, hgb 12.4 plts 226 Labs (11/20): K 4.2, creatinine 1.01, LDL 38 Labs (12/20): K 3.5, creatinine 1.8 Labs (3/21): K 4.5, creatinine 1.5  PMH: 1. Diabetic gastroparesis 2. Type II diabetes 3. HTN 4. Morbid obesity 5. CAD: s/p CABG in 12/11 after prior PCIs.  LIMA-LAD, SVG-D, seq SVG-OM1 and OM2,  SVG-PDA. Adenosine Cardiolite (8/14) with EF 53% and a small reversible apical defect with a medium, partially reversible inferior defect.  LHC (9/14) with patent SVG-D, LIMA-LAD (50% distal LAD), and sequential SVG-OM branches; the SVG-RCA and the native RCA were occluded.  This was managed medically initially, but with ongoing exertional chest pain, it was decided to open CTO.  Patient had CTO opening with 4 overlapping Xience DES in the RCA in 5/15.   - LHC (4/17): SVG-D patent, LIMA-LAD patent, distal LAD with several 40-50% stenoses, sequential SVG-OM1 and PLOM patent with 50% proximal stenosis (not flow limiting), SVG-RCA TO with patent RCA stents.  - LHC (6/18): 4/5 grafts patent (SVG-RCA TO, same as past), RCA stents patent => no change.  - LHC (9/19): Sequential SVG-PLOM/OM1 with 80% proximal stenosis, s/p DES.  - Cardiolite (1/21): EF 57%, fixed inferior defect (likely artifact), no ischemia.  6. Atypical migraines  7. OHS/OSA: Intolerant of CPAP. Uses oxygen with exertion and at night.  8. GERD with hiatal hernia 9. OA 10. Chronic systolic CHF: Echo (40/08) with EF 50-55%, grade II diastolic dysfunction, mild-moderate MR.  Echo (8/14) with EF 55-60%, grade II diastolic dysfunction, mildly increased aortic valve gradient (mean 12 mmHg) but valve opens well, mild MR and mild RV dilation.  Echo (9/15) with EF 60-65%, grade II diastolic dysfunction, mild aortic stenosis, mild mitral stenosis, mild to moderate mitral regurgitation, mildly dilated RV with normal systolic function, PA systolic pressure 46 mmHg.  - RHC (4/17): mean RA 9, PA 38/15 mean 27, mean PCWP 9, CI 2.5.  - Echo (5/17): EF 55-60%, mild LVH, mildly dilated RV with low normal systolic function, moderate TR, PASP 61 mmHg - Echo (1/19): EF 55-60%, moderate diastolic dysfunction, PASP 50 mmHg, mildly dilated RV, mild AS, mild-moderate MR. - RHC (9/19): mean RA 11, PA 34/12, mean PCWP 15, CI 2.85 - Echo (3/20): EF 25-30% with  mid-apical severe hypokinesis.  - Echo (11/20): EF 45-50%, moderately dilated RV with moderately decreased systolic function, moderate-severe MR, moderate TR.  - TEE (12/20): EF 50-55%, septal-lateral dyssynchrony, moderate RV dilation/mildly decreased function, peak RV-RA gradient 51 mmHg, moderate MR.  11. CKD 12. Chronic LBBB 13. Anxiety 14. Carotid stenosis: Followed by [facility], >67% LICA stenosis 61/95.  She had left carotid stent in 2/15. Carotid dopplers 8/15 with no significant disease. Carotid dopplers (4/16): < 40% RICA stenosis.  15. Cerebrovascular disease: CVA 2/14 with right posterior cerebral artery territory ischemic infarction. Cerebral angiogram in 6/14 showed 70% right vertebral ostial stenosis, 09-32% LICA stenosis, > 67% proximal left posterior cerebral artery stenosis, posterior communicating artery aneurysm.  Patient has had episodes of transient expressive aphasia.  Carotid dopplers (12/45) showed >80% LICA stenosis. She had a left carotid stent 2/15.  Possible CVA in 7/15. -  MRI/MRA in May 2018 that showed moderately severe stenosis of the supraclinoid segment of the right internal carotid artery, progressed since the 2016 MR angiogram and severe basilar artery stenosis.  16. Positional vertigo (suspected) 17. Palpitations: Holter (6/15) with rare PVCs/PACs.  - Event monitor (2/18): NSR, occasional PVCs 18. Dyspnea with Brilinta. 19. Melanoma: On face, s/p excision.   20. Aortic stenosis: Mild.  21. Holter (8/16) with no significant arrhythmias.  22. Lower extremity arterial dopplers (9/16) were normal.  23. Sleep study (4/18): No significant OSA.  24. Atrial fibrillation: Paroxysmal - Amiodarone stopped, ?lung toxicity.  25. H/o ITP in 3/20.  16. Partial seizures 17. Mitral regurgitation: Moderate on TEE in 12/20.    Current Outpatient Medications  Medication Sig Dispense Refill  . acetaminophen (TYLENOL) 500 MG tablet Take 500 mg by mouth every 6 (six) hours as  needed for mild pain, moderate pain, fever or headache.    . albuterol (VENTOLIN HFA) 108 (90 Base) MCG/ACT inhaler Inhale 2 puffs into the lungs every 4 (four) hours as needed for wheezing or shortness of breath.     . Alirocumab (PRALUENT) 150 MG/ML SOAJ Inject 150 mg/mL into the skin every 14 (fourteen) days.    . ALPRAZolam (XANAX) 0.5 MG tablet Take 1-2 tablets (0.5-1 mg total) by mouth See admin instructions. Take one tablet (0.5 mg) by mouth every morning and two tablets (1 mg) at night, may also take 1 tablet (0.5 mg) midday as needed for anxiety    . amLODipine (NORVASC) 10 MG tablet Take 1 tablet (10 mg total) by mouth daily. 30 tablet 5  .  apixaban (ELIQUIS) 5 MG TABS tablet Take 1 tablet (5 mg total) by mouth 2 (two) times daily. 60 tablet 6  . bisacodyl (DULCOLAX) 5 MG EC tablet Take 5 mg by mouth daily as needed for moderate constipation.    Marland Kitchen buPROPion (WELLBUTRIN XL) 150 MG 24 hr tablet Take 150 mg by mouth every morning.    . clopidogrel (PLAVIX) 75 MG tablet Take 75 mg by mouth at bedtime.    Marland Kitchen denosumab (PROLIA) 60 MG/ML SOSY injection Inject 60 mg into the skin every 6 (six) months.     . DULoxetine (CYMBALTA) 30 MG capsule Take 1 capsule (30 mg total) by mouth daily. 30 capsule 0  . ezetimibe (ZETIA) 10 MG tablet Take 1 tablet (10 mg total) by mouth at bedtime.    . Fluticasone-Salmeterol (ADVAIR) 250-50 MCG/DOSE AEPB Inhale 1 puff into the lungs daily.    Marland Kitchen gabapentin (NEURONTIN) 300 MG capsule Take 1-2 capsules (300-600 mg total) by mouth See admin instructions. 34m in the mornings and 6080mat bedtime    . HYDROcodone-acetaminophen (NORCO/VICODIN) 5-325 MG tablet Take 1 tablet by mouth at bedtime as needed for moderate pain.    . Insulin Glargine (LANTUS SOLOSTAR) 100 UNIT/ML Solostar Pen Inject 38-44 Units into the skin See admin instructions. Inject 38 units subcutaneously daily at bedtime, increase to 44 units for CBG >200    . insulin lispro (HUMALOG KWIKPEN) 100 UNIT/ML  KwikPen Inject 14-18 Units into the skin See admin instructions. Inject 14 units subcutaneously prior to breakfast and supper; add 4 units for CBG >200    . ipratropium-albuterol (DUONEB) 0.5-2.5 (3) MG/3ML SOLN Take 3 mLs by nebulization 2 (two) times daily as needed (asthma).    . losartan (COZAAR) 25 MG tablet Take 12.5 mg by mouth daily.    . metolazone (ZAROXOLYN) 2.5 MG tablet Take 1 tablet (2.5 mg total) by mouth daily as needed (As directed by heart failure clinic. Call heart failure office for direction when experiencing overnight weight gain).    . Multiple Vitamin (MULTIVITAMIN WITH MINERALS) TABS tablet Take 1 tablet by mouth every morning. Centrum - Women over 5529  . Multiple Vitamins-Minerals (OCUVITE EYE HEALTH FORMULA) CAPS Take 1 capsule by mouth every morning.    . nitroGLYCERIN (NITROSTAT) 0.3 MG SL tablet Place 1 tablet (0.3 mg total) under the tongue every 5 (five) minutes x 3 doses as needed for chest pain. 9 tablet 1  . ondansetron (ZOFRAN-ODT) 4 MG disintegrating tablet Take 4 mg by mouth 2 (two) times daily as needed for nausea or vomiting.    . OXYGEN Inhale 2 L into the lungs at bedtime as needed (shortness of breath).     . pantoprazole (PROTONIX) 40 MG tablet Take 1 tablet (40 mg total) by mouth daily. 30 tablet 0  . Polyvinyl Alcohol-Povidone (REFRESH OP) Place 1 drop into both eyes daily as needed (redness/ dry eyes).    . potassium chloride SA (KLOR-CON) 20 MEQ tablet Take 40 mEq by mouth 2 (two) times daily.    . Pyridoxine HCl (VITAMIN B-6 PO) Take 1 tablet by mouth every morning.    . ranolazine (RANEXA) 1000 MG SR tablet Take 1 tablet (1,000 mg total) by mouth 2 (two) times daily. 180 tablet 1  . rosuvastatin (CRESTOR) 40 MG tablet Take 1 tablet (40 mg total) by mouth at bedtime. 30 tablet 0  . spironolactone (ALDACTONE) 25 MG tablet Take 1 tablet (25 mg total) by mouth daily. 45 tablet 3  .  torsemide (DEMADEX) 20 MG tablet Take 4 tablets (80 mg) by mouth every  morning, take 2 tablets (40 mg) every evening 180 tablet 2  . traZODone (DESYREL) 50 MG tablet TAKE 1 TABLET BY MOUTH AT BEDTIME 90 tablet 0  . triamcinolone cream (KENALOG) 0.1 % Apply 1 application topically daily as needed (crusty spots on arms).    . vitamin B-12 (CYANOCOBALAMIN) 1000 MCG tablet Take 1,000 mcg by mouth every morning.     . zolpidem (AMBIEN) 10 MG tablet Take 0.5 tablets (5 mg total) by mouth at bedtime. 30 tablet 0  . methocarbamol (ROBAXIN) 500 MG tablet Take 500 mg by mouth 3 (three) times daily as needed.     No current facility-administered medications for this encounter.    Allergies:   Amoxicillin, Brilinta [ticagrelor], Erythromycin, Flagyl [metronidazole], Penicillins, Isosorbide mononitrate [isosorbide nitrate], Jardiance [empagliflozin], Metformin and related, Tape, and Erythromycin base   Social History:  The patient  reports that she has never smoked. She has never used smokeless tobacco. She reports that she does not drink alcohol or use drugs.   Family History:  The patient's family history includes AAA (abdominal aortic aneurysm) in her father and mother; Cancer in her sister; Deep vein thrombosis in her father and mother; Diabetes in her father, paternal aunt, paternal grandmother, paternal uncle, and paternal uncle; Heart attack in her father; Heart attack (age of onset: 46) in her paternal grandfather; Heart disease in her father and paternal uncle; Hyperlipidemia in her father; Hypertension in her father, mother, and sister.   ROS:  Please see the history of present illness.   All other systems are personally reviewed and negative.   Exam:   BP (!) 147/45   Pulse 68   Wt 100.3 kg (221 lb 3.2 oz)   SpO2 94%   BMI 40.46 kg/m  General: NAD, obese Neck: No JVD, no thyromegaly or thyroid nodule.  Lungs: Clear to auscultation bilaterally with normal respiratory effort. CV: Nondisplaced PMI.  Heart regular S1/S2, no S3/S4, 2/6 early SEM RUSB with clear S2.   No peripheral edema.  No carotid bruit.  Normal pedal pulses.  Abdomen: Soft, nontender, no hepatosplenomegaly, no distention.  Skin: Intact without lesions or rashes.  Neurologic: Alert and oriented x 3.  Psych: Normal affect. Extremities: No clubbing or cyanosis.  HEENT: Normal.   Recent Labs: 12/23/2018: ALT 17 01/17/2019: TSH 0.982 01/19/2019: Magnesium 1.9 02/19/2019: B Natriuretic Peptide 181.1 06/06/2019: BUN 13; Creatinine, Ser 1.10; Hemoglobin 11.5; Platelets 239; Potassium 4.2; Sodium 138  Personally reviewed   Wt Readings from Last 3 Encounters:  06/06/19 100.3 kg (221 lb 3.2 oz)  05/17/19 101.6 kg (224 lb)  04/13/19 101.7 kg (224 lb 3.2 oz)     ASSESSMENT AND PLAN:  1. CAD: Occluded native RCA and SVG-RCA. Status post opening of CTO RCA with 4 overlapping Xience DES in 5/15. DES to sequential SVG-OM1/PLOM in 9/19.  She has been on Plavix and Eliquis.  She has atypical chest pain.  No ischemia on 1/21 Cardiolite.  - Continuing on Plavix (neuro has wanted her to stay on this for cerebrovascular disease).   - She has not tolerated Imdur in the past. Off Toprol XL with bradycardia.  Continue ranolazine 1000 mg bid.  2. Chronic systolic CHF: Echo in 3/61 with EF 55-60%, moderate diastolic dysfunction. Cardiomems placed 01/16/18.  Echo in 3/20 in setting of severe PNA with intubation showed EF 25-30%, mid-apical LV severe hypokinesis.  Possible stress (Takotsubo-type) cardiomyopathy related to severe  medical illness versus progression of CAD.  She has baseline chronic LBBB which could play a role in cardiomyopathy as well (LBBB cardiomyopathy).  Repeat echo in 11/20 with EF up to 45-50%, moderately dysfunctional RV.  TEE in 12/20 with EF 50-55% with septal-lateral dyssynchrony and mildly dysfunctional RV.  NYHA class III symptoms chronically but not volume overloaded on exam or by Cardiomems.  I suspect her symptoms may be more related to deconditioning.  - Follow Cardiomems  regularly.    - Continue torsemide 80 qam/40 qpm with KCl 40 bid.  BMET today.   - Continue spironolactone 25 mg daily.  - Continue losartan 12.5 mg daily.  3. Hyperlipidemia: She remains on Zetia, Crestor and Praluent. Check lipids today.  4. Hypertension: BP high, increase amlodipine to 10 mg daily.  5. Cerebrovascular disease: She has history of CVA and had left carotid stent in 2/15. Followed by [facility].  Most recent MRA head showed severe stenosis of supraclinoid RICA and severe basilar artery stenosis.  - Neurology would like her to continue Plavix for this despite concomitant apixaban use.  6. OHS/OSA: Cannot tolerate CPAP.  Uses oxygen with exertion and at night. No change. 7. Atrial fibrillation: Paroxysmal, no recent palpitations. 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  She is in NSR today.  - She has completed course of prednisone for amiodarone lung toxicity.    - Continue apixaban 5 mg bid.  8. Obesity: Weight has trended down over the last year.  9. Mitral regurgitation: Moderate to severe MR on last echo in 11/20 but moderate on 12/20 TEE.  No significant enough for Mitraclip.  10. Bradycardia: Now off Toprol XL.    Followup in 3 months.   Signed, Loralie Champagne, MD  06/07/2019  [facility] [address] [facility] [place] [address] [phone] (office) [phone] (fax)

## 2019-06-11 ENCOUNTER — Ambulatory Visit: Payer: Medicare Other

## 2019-06-14 ENCOUNTER — Other Ambulatory Visit: Payer: Self-pay

## 2019-06-14 ENCOUNTER — Encounter: Payer: Self-pay | Admitting: Physical Therapy

## 2019-06-14 ENCOUNTER — Ambulatory Visit: Payer: Medicare Other | Admitting: Physical Therapy

## 2019-06-14 VITALS — BP 132/62 | HR 65

## 2019-06-14 DIAGNOSIS — R262 Difficulty in walking, not elsewhere classified: Secondary | ICD-10-CM

## 2019-06-14 DIAGNOSIS — R2689 Other abnormalities of gait and mobility: Secondary | ICD-10-CM

## 2019-06-14 DIAGNOSIS — M6281 Muscle weakness (generalized): Secondary | ICD-10-CM

## 2019-06-14 DIAGNOSIS — R2681 Unsteadiness on feet: Secondary | ICD-10-CM

## 2019-06-14 NOTE — Therapy (Signed)
[facility] [address]  [place], [place], [address] Phone: [phone]   Fax:  [phone]  Physical Therapy Treatment  Patient Details  Name: [name] [name] [name]: [id number] Date of Birth: 03-Oct-1945 Referring Provider (PT): Loralie Champagne, MD   Encounter Date: 06/14/2019  PT End of Session - 06/14/19 1452    Visit Number  13    Number of Visits  24    Date for PT Re-Evaluation  07/17/19    Authorization Type  Medicare & AARP    PT Start Time  1401    PT Stop Time  1445    PT Time Calculation (min)  44 min    Equipment Utilized During Treatment  Gait belt    Activity Tolerance  Patient tolerated treatment well;Patient limited by fatigue    Behavior During Therapy  [facility] for tasks assessed/performed       Past Medical History:  Diagnosis Date  . Anemia    hx  . Anxiety   . Asthma   . Basal cell carcinoma 05/2014   "left shoulder"  . Bundle branch block, left    chronic/notes 07/18/2013  . CHF (congestive heart failure) ([place])   . Chronic insomnia 05/06/2015  . Chronic kidney disease    frequency, sees dr Jamal Maes every 4 to 6 months (01/16/2018)  . Chronic lower back pain   . Claustrophobia   . Common migraine 05/14/2014  . Coronary artery disease    MI in 2001, 2002, 2006, 2011, 2014  . Depression   . Diabetic peripheral neuropathy ([place]) 01/12/2019  . GERD (gastroesophageal reflux disease)   . H/O hiatal hernia   . Headache    "at least 2/month" (01/16/2018)  . Heart murmur   . Hyperlipidemia   . Hypertension   . Memory change 05/14/2014  . Migraine    "5-6/year"  (01/16/2018)  . Obesity 01-2010  . Obstructive sleep apnea    "can't wear machine; I have claustrophobia" (01/16/2018), states she had a 2nd sleep study and does not have sleep apnea, her O2 decreases and now is on 2 L of O2 at night.  . On home oxygen therapy    "2L at night and prn during daytime" (01/16/2018)  . Osteoarthritis     "knees and hands" (01/16/2018)  . Peripheral vascular disease (HCC)    ? numbness, tingling arms and legs  . PONV (postoperative nausea and vomiting)   . Stroke [facility])    2014, 2015, 2016  . Type II diabetes mellitus ([place])   . Ventral hernia    hx of    Past Surgical History:  Procedure Laterality Date  . ANKLE FRACTURE SURGERY Left 1970's  . APPENDECTOMY  1970's   w/hysterectomy  . BASAL CELL CARCINOMA EXCISION Left 05/2014   "shoulder" (01/16/2018)  . CARDIAC CATHETERIZATION  10/10/2012   Dr [name].  Marland Kitchen CARDIAC CATHETERIZATION N/A 05/29/2015   Procedure: Right/Left Heart Cath and Coronary/Graft Angiography;  Surgeon: Larey Dresser, MD;  Location: [place] CV LAB;  Service: Cardiovascular;  Laterality: N/A;  . CAROTID ENDARTERECTOMY Left 03/2013  . CAROTID STENT INSERTION Left 03/20/2013   Procedure: CAROTID STENT INSERTION;  Surgeon: [name], MD;  [facility] C[facility] LAB;  Service: Cardiovascular;  Laterality: Left;  internal carotid  . CEREBRAL ANGIOGRAM N/A 04/05/2011   Procedure: CEREBRAL ANGIOGRAM;  Surgeon: Angelia Mould, MD;  Location: [facility] CATH LAB;  Service: Cardiovascular;  Laterality: N/A;  . CHOLECYSTECTOMY  OPEN  2004  . CORONARY ANGIOPLASTY WITH STENT PLACEMENT  01,02,05,06,07,08,11; 04/24/2013   "I've probably got ~ 10 stents by now" (04/24/2013)  . CORONARY ANGIOPLASTY WITH STENT PLACEMENT  06/13/2013   "got 4 stents today" (06/13/2013)  . CORONARY ARTERY BYPASS GRAFT  1220/11   "CABG X5"  . CORONARY STENT INTERVENTION N/A 10/20/2017   Procedure: CORONARY STENT INTERVENTION;  Surgeon: Troy Sine, MD;  Location: [place] CV LAB;  Service: Cardiovascular;  Laterality: N/A;  . ESOPHAGOGASTRODUODENOSCOPY  08/03/2011   Procedure: ESOPHAGOGASTRODUODENOSCOPY (EGD);  Surgeon: Shann Medal, MD;  Location: [facility];  Service: General;  Laterality: N/A;  . ESOPHAGOGASTRODUODENOSCOPY (EGD) WITH PROPOFOL N/A 03/11/2014   Procedure: ESOPHAGOGASTRODUODENOSCOPY  (EGD) WITH PROPOFOL;  Surgeon: Lafayette Dragon, MD;  Location: [facility] ENDOSCOPY;  Service: Endoscopy;  Laterality: N/A;  . FRACTURE SURGERY    . gall stone removal  05/2003  . GASTRIC RESTRICTION SURGERY  1984   "stapeling"  . HERNIA REPAIR  2004   "in my stomach; had OR on it twice", wire mesh on 1 hernia  . LEFT HEART CATH AND CORS/GRAFTS ANGIOGRAPHY N/A 07/09/2016   Procedure: LEFT HEART CATH AND CORS/GRAFTS ANGIOGRAPHY;  Surgeon: Larey Dresser, MD;  Location: [place] [facility] LAB;  Service: Cardiovascular;  Laterality: N/A;  . ORIF ANKLE FRACTURE Left 05/16/2018  . ORIF ANKLE FRACTURE Left 05/16/2018   Procedure: OPEN REDUCTION INTERNAL FIXATION (ORIF) Left ankle with possible syndesmosis fixation;  Surgeon: Nicholes Stairs, MD;  Location: [place];  Service: Orthopedics;  Laterality: Left;  159mn  . OVARY SURGERY  1970's   "tumor removed"  . PERCUTANEOUS CORONARY STENT INTERVENTION (PCI-S) N/A 06/13/2013   Procedure: PERCUTANEOUS CORONARY STENT INTERVENTION (PCI-S);  Surgeon: JJettie Booze MD;  [facility];  Service: Cardiovascular;  Laterality: N/A;  . PERCUTANEOUS STENT INTERVENTION N/A 04/24/2013   Procedure: PERCUTANEOUS STENT INTERVENTION;  Surgeon: JJettie Booze MD;  [facility];  Service: Cardiovascular;  Laterality: N/A;  . RIGHT/LEFT HEART CATH AND CORONARY/GRAFT ANGIOGRAPHY N/A 10/20/2017   Procedure: RIGHT/LEFT HEART CATH AND CORONARY/GRAFT ANGIOGRAPHY;  Surgeon: MLarey Dresser MD;  [facility];  Service: Cardiovascular;  Laterality: N/A;  . ROOT CANAL  10/2000  . TEE WITHOUT CARDIOVERSION N/A 01/18/2019   Procedure: TRANSESOPHAGEAL ECHOCARDIOGRAM (TEE);  Surgeon: MLarey Dresser MD;  [facility];  Service: Cardiovascular;  Laterality: N/A;  . TIBIA FRACTURE SURGERY Right 1970's   rods and pins  . TOOTH EXTRACTION     "1 on the upper; wisdom tooth on the lower" (01/16/2018)  . TOTAL ABDOMINAL HYSTERECTOMY  1970's    w/ appendectomy    Vitals:   06/14/19 1402  BP: 132/62  Pulse: 65  SpO2: 94%    Subjective Assessment - 06/14/19 1404    Subjective  Reports that her pneumonia has not gotten better as determined by recent chest-xray. Was put on Prednisone and another course of antibiotics on Friday and is feeling a bit better and is not SOB with short periods of activity any longer.    Pertinent History  DM II, stroke 2014, 2015, 2016, PVD, supplemental O2 use, migraine, memory change, HTN, HLD, HA, hiatal hernia, GERD, peripheral neuropathy, depression, CAD, claustrophobia, CHF, L BBB, asthma, anxiety, anemia, L ankle ORIF 05/2018, CABG x5 2011    Diagnostic tests  none recent    Patient Stated Goals  "get to walking again and get control of my fatigue"    Currently in  Pain?  Yes    Pain Score  7     Pain Location  Back    Pain Orientation  Lower;Right;Left    Pain Descriptors / Indicators  Aching    Pain Type  Chronic pain    Pain Score  4    Pain Location  Back   over L lung field   Pain Orientation  Left;Upper    Pain Descriptors / Indicators  Sore    Pain Type  Acute pain                        [facility] Adult PT Treatment/Exercise - 06/14/19 0001      Self-Care   Self-Care  Other Self-Care Comments    Other Self-Care Comments   sitting diaphragmatic breathing with/without self resistance at ribs during inhales 3 sec, and exhale 6 sec x10 cycles   cueing to avoid accessory muscle use     Lumbar Exercises: Seated   Other Seated Lumbar Exercises  isometric ab set with beach ball in lap 2 sets 5x5"    Other Seated Lumbar Exercises  R/L sidebody stretch with rhythmic breathing 5x3" stretch each side      Knee/Hip Exercises: Aerobic   Nustep  Lvl 1, 6 min (UEs/LEs)      Knee/Hip Exercises: Seated   Clamshell with TheraBand  Green   x15   Other Seated Knee/Hip Exercises  lumbar prayer stretch with green pball 5x3" to tolerance    Hamstring Curl  Strengthening;Right;Left;1  set;10 reps    Hamstring Limitations  red TB   good control     Manual Therapy   Manual therapy comments  manual resistance at anterior ribs with diaphragmatic inhale 3 sec followed by 6 sec exhale- 5 cycles             PT Education - 06/14/19 1451    Education Details  update to HEP    Person(s) Educated  Patient    Methods  Explanation;Demonstration;Tactile cues;Verbal cues;Handout    Comprehension  Verbalized understanding;Returned demonstration       PT Short Term Goals - 06/05/19 1403      PT SHORT TERM GOAL #1   Title  Patient to be independent with initial HEP.    Time  3    Period  Weeks    Status  Achieved    Target Date  04/25/19        PT Long Term Goals - 06/05/19 1403      PT LONG TERM GOAL #1   Title  Patient to be independent with advanced HEP.    Time  6    Period  Weeks    Status  Partially Met   recently limited by feeling unwell   Target Date  07/17/19      PT LONG TERM GOAL #2   Title  Patient to demonstrate B LE strength >/=4+/5.    Time  6    Period  Weeks    Status  Partially Met   slight decline in B hip abduction, R hip flexion, L knee flexion, and R ankle DF/PF   Target Date  07/17/19      PT LONG TERM GOAL #3   Title  Patient to score <14 sec on TUG testing in order to decrease risk of falls.    Baseline  38.34 sec with RW    Time  6    Period  Weeks    Status  Partially Met   06/05/19 26.94 sec with RW   Target Date  07/17/19      PT LONG TERM GOAL #4   Title  Patient to score <12 sec on 5xSTS in order to decrease risk of falls.    Baseline  28.29 sec pushing off from B armrests    Time  6    Period  Weeks    Status  Partially Met   06/05/19 21.5 sec pushing off from B knees with last rep requiring push off from armrests   Target Date  07/17/19      PT LONG TERM GOAL #5   Title  Patient to report tolerance for 15 min of walking with LRAD without fatigue limiting.    Time  6    Period  Weeks    Status  On-going    reports limited to 3-4 minutes before fatigue   Target Date  07/17/19            Plan - 06/14/19 1452    Clinical Impression Statement  Patient reporting that she had a repeat chest x-ray on 06/06/19 which still showed evidence of pneumonia. Patient however feeling better since being placed on Prednisone and antibiotics. SpO2 was maintained at >90% with monitoring throughout session. Modifications made to exercises to avoid excessive fatigue. Reviewed diaphragmatic breathing with patient, as she reported some confusion. Patient initially using accessory muscles heavily and using paradoxical breathing pattern. Applied resistance at the ribs during inhalation, which allowed patient to better utilize diaphragm for breathing. Worked on sitting LE strengthening ther-ex and gentle stretching for improvement in patient's report of LBP. Patient reported relief at end of session. Patient demonstrating good focus and effort with therapy despite current pneumonia diagnosis.    Comorbidities  DM II, stroke 2014, 2015, 2016, PVD, supplemental O2 use, migraine, memory change, HTN, HLD, HA, hiatal hernia, GERD, peripheral neuropathy, depression, CAD, claustrophobia, CHF, L BBB, asthma, anxiety, anemia, L ankle ORIF 05/2018, CABG x5 2011    Rehab Potential  Good    PT Frequency  2x / week    PT Duration  6 weeks    PT Treatment/Interventions  ADLs/Self Care Home Management;Cryotherapy;Electrical Stimulation;Moist Heat;Balance training;Therapeutic exercise;Therapeutic activities;Functional mobility training;Stair training;Gait training;Ultrasound;Neuromuscular re-education;Patient/family education;Manual techniques;Taping;Energy conservation;Dry needling;Passive range of motion    PT Next Visit Plan  LE strengthening; gait training for improved RW safety    Consulted and Agree with Plan of Care  Patient       Patient will benefit from skilled therapeutic intervention in order to improve the following deficits  and impairments:  Abnormal gait, Decreased endurance, Cardiopulmonary status limiting activity, Decreased activity tolerance, Decreased strength, Pain, Decreased balance, Decreased mobility, Difficulty walking, Improper body mechanics, Decreased range of motion, Impaired flexibility, Postural dysfunction  Visit Diagnosis: Muscle weakness (generalized)  Unsteadiness on feet  Other abnormalities of gait and mobility  Difficulty in walking, not elsewhere classified     Problem List Patient Active Problem List   Diagnosis Date Noted  . Symptomatic bradycardia 01/16/2019  . Normocytic anemia 01/16/2019  . Diabetic peripheral neuropathy ([place]) 01/12/2019  . Neurologic abnormality 12/25/2018  . Debility 09/21/2018  . Hypotension 09/18/2018  . Bradycardia 09/18/2018  . AKI (acute kidney injury) ([place]) 09/18/2018  . Pressure injury of skin 09/18/2018  . Ischemic cerebrovascular accident (CVA) of frontal lobe ([place]) 09/13/2018  . Fall 09/09/2018  . History of CVA (cerebrovascular accident) 09/09/2018  . Type II diabetes mellitus with renal manifestations ([place]) 09/09/2018  . CKD (  chronic kidney disease), stage III 09/09/2018  . Sepsis ([place]) 09/09/2018  . Depression with anxiety 09/09/2018  . Slurred speech 09/09/2018  . Ankle fracture, left 05/16/2018  . Amiodarone pulmonary toxicity   . Physical deconditioning   . 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 Acute on chronic congestive heart failure ([place])   . Chronic combined systolic (congestive) and diastolic (congestive) heart failure ([place]) 01/16/2018  . Chronic systolic CHF (congestive heart failure) ([place]) 01/16/2018  . Atrial fibrillation ([place]) 10/21/2017  . CAD (coronary artery disease) of bypass graft 10/20/2017  . Coronary artery disease of bypass graft of native heart with stable angina pectoris ([place])   . Chronic low back pain 08/24/2016  . Pain of left thumb 08/05/2016  . Nocturnal hypoxia 06/02/2016  . Hoarseness 04/05/2016  .  Laryngopharyngeal reflux (LPR) 04/05/2016  . Pharyngoesophageal dysphagia 04/05/2016  . Angina decubitus ([place]) 05/22/2015  . Chronic insomnia 05/06/2015  . Common migraine 05/14/2014  . Abnormality of gait 05/14/2014  . Memory change 05/14/2014  . Aneurysm, cerebral, nonruptured 05/14/2014  . Cramp of limb-Left neck 05/10/2014  . Hematemesis with nausea   . Vomiting blood   . Dizziness and giddiness 09/21/2013  . Atypical chest pain 07/18/2013  . Unstable angina ([place]) 04/09/2013  . Carotid artery stenosis, symptomatic 03/20/2013  . Cerebrovascular disease 07/10/2012  . Cerebral artery occlusion with cerebral infarction ([place]) 07/10/2012  . Mitral regurgitation 04/15/2012  . TIA (transient ischemic attack) 04/15/2012  . Occlusion and stenosis of carotid artery without mention of cerebral infarction 08/18/2011  . Bariatric surgery status 06/17/2011  . Speech abnormality 03/22/2011  . Dyspnea 02/24/2011  . PAPILLARY MUSCLE DYSFUNCTION, NON-RHEUMATIC 10/09/2008  . UNSPECIFIED VITAMIN D DEFICIENCY 10/24/2007  . MYOCARDIAL INFARCTION, HX OF 10/24/2007  . PERSISTENT VOMITING 10/24/2007  . OSTEOARTHRITIS 10/24/2007  . MIGRAINES, HX OF 10/24/2007  . Diabetes mellitus type 2, controlled, with complications ([place]) 65/68/1275  . Hyperlipemia 01/16/2007  . Obesity-post failed open gastroplasty 1984  01/16/2007  . OBSTRUCTIVE SLEEP APNEA 01/16/2007  . Essential hypertension 01/16/2007  . Coronary atherosclerosis 01/16/2007  . Asthma 01/16/2007  . GERD 01/16/2007  . VENTRAL HERNIA 01/16/2007     Janene Harvey, PT, DPT 06/14/19 3:37 PM   [facility] [address]  [place], [place], [address] Phone: [phone]   Fax:  [phone]  Name: [name] [name] [name]: [id number] Date of Birth: Jun 04, 1945

## 2019-06-18 ENCOUNTER — Other Ambulatory Visit: Payer: Self-pay

## 2019-06-18 ENCOUNTER — Ambulatory Visit: Payer: Medicare Other

## 2019-06-18 VITALS — BP 152/60 | HR 71

## 2019-06-18 DIAGNOSIS — R262 Difficulty in walking, not elsewhere classified: Secondary | ICD-10-CM

## 2019-06-18 DIAGNOSIS — R2689 Other abnormalities of gait and mobility: Secondary | ICD-10-CM

## 2019-06-18 DIAGNOSIS — M6281 Muscle weakness (generalized): Secondary | ICD-10-CM

## 2019-06-18 DIAGNOSIS — R2681 Unsteadiness on feet: Secondary | ICD-10-CM

## 2019-06-18 NOTE — Therapy (Signed)
[facility] [address]  [place], [place], [address] Phone: [phone]   Fax:  [phone]  Physical Therapy Treatment  Patient Details  Name: [name] [name] [name]: [id number] Date of Birth: [birth year] Referring Provider (PT): Loralie Champagne, MD   Encounter Date: 06/18/2019  PT End of Session - 06/18/19 1415    Visit Number  14    Number of Visits  24    Date for PT Re-Evaluation  07/17/19    Authorization Type  Medicare & AARP    PT Start Time  1403    PT Stop Time  1452    PT Time Calculation (min)  49 min    Equipment Utilized During Treatment  --    Activity Tolerance  Patient tolerated treatment well;Patient limited by fatigue    Behavior During Therapy  [facility] for tasks assessed/performed       Past Medical History:  Diagnosis Date  . Anemia    hx  . Anxiety   . Asthma   . Basal cell carcinoma 05/2014   "left shoulder"  . Bundle branch block, left    chronic/notes 07/18/2013  . CHF (congestive heart failure) ([place])   . Chronic insomnia 05/06/2015  . Chronic kidney disease    frequency, sees dr Jamal Maes every 4 to 6 months (01/16/2018)  . Chronic lower back pain   . Claustrophobia   . Common migraine 05/14/2014  . Coronary artery disease    MI in 2001, 2002, 2006, 2011, 2014  . Depression   . Diabetic peripheral neuropathy ([place]) 01/12/2019  . GERD (gastroesophageal reflux disease)   . H/O hiatal hernia   . Headache    "at least 2/month" (01/16/2018)  . Heart murmur   . Hyperlipidemia   . Hypertension   . Memory change 05/14/2014  . Migraine    "5-6/year"  (01/16/2018)  . Obesity 01-2010  . Obstructive sleep apnea    "can't wear machine; I have claustrophobia" (01/16/2018), states she had a 2nd sleep study and does not have sleep apnea, her O2 decreases and now is on 2 L of O2 at night.  . On home oxygen therapy    "2L at night and prn during daytime" (01/16/2018)  . Osteoarthritis     "knees and hands" (01/16/2018)  . Peripheral vascular disease (HCC)    ? numbness, tingling arms and legs  . PONV (postoperative nausea and vomiting)   . Stroke [facility])    2014, 2015, 2016  . Type II diabetes mellitus ([place])   . Ventral hernia    hx of    Past Surgical History:  Procedure Laterality Date  . ANKLE FRACTURE SURGERY Left 1970's  . APPENDECTOMY  1970's   w/hysterectomy  . BASAL CELL CARCINOMA EXCISION Left 05/2014   "shoulder" (01/16/2018)  . CARDIAC CATHETERIZATION  10/10/2012   Dr [name].  Marland Kitchen CARDIAC CATHETERIZATION N/A 05/29/2015   Procedure: Right/Left Heart Cath and Coronary/Graft Angiography;  Surgeon: Larey Dresser, MD;  Location: [place] CV [facility];  Service: Cardiovascular;  Laterality: N/A;  . CAROTID ENDARTERECTOMY Left 03/2013  . CAROTID STENT INSERTION Left 03/20/2013   Procedure: CAROTID STENT INSERTION;  Surgeon: [name], MD;  Location: [facility] CATH LAB;  Service: Cardiovascular;  Laterality: Left;  internal carotid  . CEREBRAL ANGIOGRAM N/A 04/05/2011   Procedure: CEREBRAL ANGIOGRAM;  Surgeon: Angelia Mould, MD;  Location: [facility] CATH LAB;  Service: Cardiovascular;  Laterality: N/A;  . CHOLECYSTECTOMY OPEN  2004  . CORONARY ANGIOPLASTY WITH STENT PLACEMENT  01,02,05,06,07,08,11; 04/24/2013   "I've probably got ~ 10 stents by now" (04/24/2013)  . CORONARY ANGIOPLASTY WITH STENT PLACEMENT  06/13/2013   "got 4 stents today" (06/13/2013)  . CORONARY ARTERY BYPASS GRAFT  1220/11   "CABG X5"  . CORONARY STENT INTERVENTION N/A 10/20/2017   Procedure: CORONARY STENT INTERVENTION;  Surgeon: [place] Sine, MD;  Location: [place] CV LAB;  Service: Cardiovascular;  Laterality: N/A;  . ESOPHAGOGASTRODUODENOSCOPY  08/03/2011   Procedure: ESOPHAGOGASTRODUODENOSCOPY (EGD);  Surgeon: Shann Medal, MD;  Location: [facility];  Service: General;  Laterality: N/A;  . ESOPHAGOGASTRODUODENOSCOPY (EGD) WITH PROPOFOL N/A 03/11/2014   Procedure: ESOPHAGOGASTRODUODENOSCOPY  (EGD) WITH PROPOFOL;  Surgeon: Lafayette Dragon, MD;  Location: [facility] ENDOSCOPY;  Service: Endoscopy;  Laterality: N/A;  . FRACTURE SURGERY    . gall stone removal  05/2003  . GASTRIC RESTRICTION SURGERY  1984   "stapeling"  . HERNIA REPAIR  2004   "in my stomach; had OR on it twice", wire mesh on 1 hernia  . LEFT HEART CATH AND CORS/GRAFTS ANGIOGRAPHY N/A 07/09/2016   Procedure: LEFT HEART CATH AND CORS/GRAFTS ANGIOGRAPHY;  Surgeon: Larey Dresser, MD;  Location: [place] [facility];  Service: Cardiovascular;  Laterality: N/A;  . ORIF ANKLE FRACTURE Left 05/16/2018  . ORIF ANKLE FRACTURE Left 05/16/2018   Procedure: OPEN REDUCTION INTERNAL FIXATION (ORIF) Left ankle with possible syndesmosis fixation;  Surgeon: Nicholes Stairs, MD;  Location: [place];  Service: Orthopedics;  Laterality: Left;  140mn  . OVARY SURGERY  1970's   "tumor removed"  . PERCUTANEOUS CORONARY STENT INTERVENTION (PCI-S) N/A 06/13/2013   Procedure: PERCUTANEOUS CORONARY STENT INTERVENTION (PCI-S);  Surgeon: JJettie Booze MD;  [facility];  Service: Cardiovascular;  Laterality: N/A;  . PERCUTANEOUS STENT INTERVENTION N/A 04/24/2013   Procedure: PERCUTANEOUS STENT INTERVENTION;  Surgeon: JJettie Booze MD;  [facility];  Service: Cardiovascular;  Laterality: N/A;  . RIGHT/LEFT HEART CATH AND CORONARY/GRAFT ANGIOGRAPHY N/A 10/20/2017   Procedure: RIGHT/LEFT HEART CATH AND CORONARY/GRAFT ANGIOGRAPHY;  Surgeon: MLarey Dresser MD;  [facility];  Service: Cardiovascular;  Laterality: N/A;  . ROOT CANAL  10/2000  . TEE WITHOUT CARDIOVERSION N/A 01/18/2019   Procedure: TRANSESOPHAGEAL ECHOCARDIOGRAM (TEE);  Surgeon: MLarey Dresser MD;  [facility];  Service: Cardiovascular;  Laterality: N/A;  . TIBIA FRACTURE SURGERY Right 1970's   rods and pins  . TOOTH EXTRACTION     "1 on the upper; wisdom tooth on the lower" (01/16/2018)  . TOTAL ABDOMINAL HYSTERECTOMY  1970's    w/ appendectomy    Vitals:   06/18/19 1423  BP: (!) 152/60  Pulse: 71  SpO2: 94%    Subjective Assessment - 06/18/19 1443    Subjective  Having increased LBP today.    Pertinent History  DM II, stroke 2014, 2015, 2016, PVD, supplemental O2 use, migraine, memory change, HTN, HLD, HA, hiatal hernia, GERD, peripheral neuropathy, depression, CAD, claustrophobia, CHF, L BBB, asthma, anxiety, anemia, L ankle ORIF 05/2018, CABG x5 2011    Diagnostic tests  none recent    Patient Stated Goals  "get to walking again and get control of my fatigue"    Currently in Pain?  Yes    Pain Score  9     Pain Location  Back    Pain Orientation  Lower;Right;Left  [place] Adult PT Treatment/Exercise - 06/18/19 0001      Lumbar Exercises: Seated   Long Arc Quad on Chair  Right;Left;10 reps    LAQ on Chair Limitations  red TB       Knee/Hip Exercises: Aerobic   Nustep  Lvl 3, 6 min (UEs/LEs)      Knee/Hip Exercises: Standing   Heel Raises  15 reps;Both    Heel Raises Limitations  heel/toe raises    Knee Flexion  Right;Left;10 reps    Knee Flexion Limitations  red looped band; in RW    Hip Flexion  Right;Left;Knee bent;Stengthening;1 set;5 reps    Hip Flexion Limitations  in RW    Functional Squat  10 reps;3 seconds    Functional Squat Limitations  RW      Manual Therapy   Manual Therapy  Soft tissue mobilization    Manual therapy comments  sitting     Soft tissue mobilization  STM/DTM to B lumbar paraspinals, superior buttocks, thoracic paraspinals                PT Short Term Goals - 06/05/19 1403      PT SHORT TERM GOAL #1   Title  Patient to be independent with initial HEP.    Time  3    Period  Weeks    Status  Achieved    Target Date  04/25/19        PT Long Term Goals - 06/05/19 1403      PT LONG TERM GOAL #1   Title  Patient to be independent with advanced HEP.    Time  6    Period  Weeks    Status  Partially Met   recently  limited by feeling unwell   Target Date  07/17/19      PT LONG TERM GOAL #2   Title  Patient to demonstrate B LE strength >/=4+/5.    Time  6    Period  Weeks    Status  Partially Met   slight decline in B hip abduction, R hip flexion, L knee flexion, and R ankle DF/PF   Target Date  07/17/19      PT LONG TERM GOAL #3   Title  Patient to score <14 sec on TUG testing in order to decrease risk of falls.    Baseline  38.34 sec with RW    Time  6    Period  Weeks    Status  Partially Met   06/05/19 26.94 sec with RW   Target Date  07/17/19      PT LONG TERM GOAL #4   Title  Patient to score <12 sec on 5xSTS in order to decrease risk of falls.    Baseline  28.29 sec pushing off from B armrests    Time  6    Period  Weeks    Status  Partially Met   06/05/19 21.5 sec pushing off from B knees with last rep requiring push off from armrests   Target Date  07/17/19      PT LONG TERM GOAL #5   Title  Patient to report tolerance for 15 min of walking with LRAD without fatigue limiting.    Time  6    Period  Weeks    Status  On-going   reports limited to 3-4 minutes before fatigue   Target Date  07/17/19            Plan -  06/18/19 1415    Clinical Impression Statement  [place] doing well to start session however with complaint of high subjective 9/10 LBP.  MT addressing lumbar/thoracic paraspinals with good report of relief noted.  LE strengthening performed to pt. tolerance today with intermittent rest breaks taken due to shortness of breath.  vitals remained WFL throughout session.    Comorbidities  DM II, stroke 2014, 2015, 2016, PVD, supplemental O2 use, migraine, memory change, HTN, HLD, HA, hiatal hernia, GERD, peripheral neuropathy, depression, CAD, claustrophobia, CHF, L BBB, asthma, anxiety, anemia, L ankle ORIF 05/2018, CABG x5 2011    Rehab Potential  Good    PT Frequency  2x / week    PT Treatment/Interventions  ADLs/Self Care Home Management;Cryotherapy;Electrical  Stimulation;Moist Heat;Balance training;Therapeutic exercise;Therapeutic activities;Functional mobility training;Stair training;Gait training;Ultrasound;Neuromuscular re-education;Patient/family education;Manual techniques;Taping;Energy conservation;Dry needling;Passive range of motion    PT Next Visit Plan  LE strengthening; gait training for improved RW safety    Consulted and Agree with Plan of Care  Patient       Patient will benefit from skilled therapeutic intervention in order to improve the following deficits and impairments:  Abnormal gait, Decreased endurance, Cardiopulmonary status limiting activity, Decreased activity tolerance, Decreased strength, Pain, Decreased balance, Decreased mobility, Difficulty walking, Improper body mechanics, Decreased range of motion, Impaired flexibility, Postural dysfunction  Visit Diagnosis: Muscle weakness (generalized)  Unsteadiness on feet  Other abnormalities of gait and mobility  Difficulty in walking, not elsewhere classified     Problem List Patient Active Problem List   Diagnosis Date Noted  . Symptomatic bradycardia 01/16/2019  . Normocytic anemia 01/16/2019  . Diabetic peripheral neuropathy ([place]) 01/12/2019  . Neurologic abnormality 12/25/2018  . Debility 09/21/2018  . Hypotension 09/18/2018  . Bradycardia 09/18/2018  . AKI (acute kidney injury) ([place]) 09/18/2018  . Pressure injury of skin 09/18/2018  . Ischemic cerebrovascular accident (CVA) of frontal lobe ([place]) 09/13/2018  . Fall 09/09/2018  . History of CVA (cerebrovascular accident) 09/09/2018  . Type II diabetes mellitus with renal manifestations ([place]) 09/09/2018  . CKD (chronic kidney disease), stage III 09/09/2018  . Sepsis ([place]) 09/09/2018  . Depression with anxiety 09/09/2018  . Slurred speech 09/09/2018  . Ankle fracture, left 05/16/2018  . Amiodarone pulmonary toxicity   . Physical deconditioning   . 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 Acute on chronic congestive  heart failure ([place])   . Chronic combined systolic (congestive) and diastolic (congestive) heart failure ([place]) 01/16/2018  . Chronic systolic CHF (congestive heart failure) ([place]) 01/16/2018  . Atrial fibrillation ([place]) 10/21/2017  . CAD (coronary artery disease) of bypass graft 10/20/2017  . Coronary artery disease of bypass graft of native heart with stable angina pectoris ([place])   . Chronic low back pain 08/24/2016  . Pain of left thumb 08/05/2016  . Nocturnal hypoxia 06/02/2016  . Hoarseness 04/05/2016  . Laryngopharyngeal reflux (LPR) 04/05/2016  . Pharyngoesophageal dysphagia 04/05/2016  . Angina decubitus ([place]) 05/22/2015  . Chronic insomnia 05/06/2015  . Common migraine 05/14/2014  . Abnormality of gait 05/14/2014  . Memory change 05/14/2014  . Aneurysm, cerebral, nonruptured 05/14/2014  . Cramp of limb-Left neck 05/10/2014  . Hematemesis with nausea   . Vomiting blood   . Dizziness and giddiness 09/21/2013  . Atypical chest pain 07/18/2013  . Unstable angina ([place]) 04/09/2013  . Carotid artery stenosis, symptomatic 03/20/2013  . Cerebrovascular disease 07/10/2012  . Cerebral artery occlusion with cerebral infarction ([place]) 07/10/2012  . Mitral regurgitation 04/15/2012  . TIA (transient ischemic attack)  04/15/2012  . Occlusion and stenosis of carotid artery without mention of cerebral infarction 08/18/2011  . Bariatric surgery status 06/17/2011  . Speech abnormality 03/22/2011  . Dyspnea 02/24/2011  . PAPILLARY MUSCLE DYSFUNCTION, NON-RHEUMATIC 10/09/2008  . UNSPECIFIED VITAMIN D DEFICIENCY 10/24/2007  . MYOCARDIAL INFARCTION, HX OF 10/24/2007  . PERSISTENT VOMITING 10/24/2007  . OSTEOARTHRITIS 10/24/2007  . MIGRAINES, HX OF 10/24/2007  . Diabetes mellitus type 2, controlled, with complications ([place]) 45/91/3685  . Hyperlipemia 01/16/2007  . Obesity-post failed open gastroplasty 1984  01/16/2007  . OBSTRUCTIVE SLEEP APNEA 01/16/2007  . Essential hypertension 01/16/2007   . Coronary atherosclerosis 01/16/2007  . Asthma 01/16/2007  . GERD 01/16/2007  . VENTRAL HERNIA 01/16/2007    Bess Harvest, PTA 06/18/19 6:11 PM   [facility] [address]  [place], [place], [address] Phone: [phone]   Fax:  [phone]  Name: [name] [name] [name]: [id number] Date of Birth: Mar 29, 1945

## 2019-06-21 ENCOUNTER — Other Ambulatory Visit: Payer: Self-pay

## 2019-06-21 ENCOUNTER — Ambulatory Visit: Payer: Medicare Other

## 2019-06-21 VITALS — BP 144/62 | HR 83

## 2019-06-21 DIAGNOSIS — R262 Difficulty in walking, not elsewhere classified: Secondary | ICD-10-CM

## 2019-06-21 DIAGNOSIS — R2681 Unsteadiness on feet: Secondary | ICD-10-CM

## 2019-06-21 DIAGNOSIS — R2689 Other abnormalities of gait and mobility: Secondary | ICD-10-CM

## 2019-06-21 DIAGNOSIS — M6281 Muscle weakness (generalized): Secondary | ICD-10-CM

## 2019-06-21 NOTE — Therapy (Signed)
[facility] [address]  [place], [place], [address] Phone: [phone]   Fax:  [phone]  Physical Therapy Treatment  Patient Details  Name: [name] [name] [name]: [id number] Date of Birth: 12/17/1945 Referring Provider (PT): Loralie Champagne, MD   Encounter Date: 06/21/2019  PT End of Session - 06/21/19 1417    Visit Number  15    Number of Visits  24    Date for PT Re-Evaluation  07/17/19    Authorization Type  Medicare & AARP    PT Start Time  5427   Pt. arrived late   PT Stop Time  1458   Ended visit with 10 min moist heat   PT Time Calculation (min)  50 min    Activity Tolerance  Patient tolerated treatment well;Patient limited by fatigue    Behavior During Therapy  [facility] for tasks assessed/performed       Past Medical History:  Diagnosis Date  . Anemia    hx  . Anxiety   . Asthma   . Basal cell carcinoma 05/2014   "left shoulder"  . Bundle branch block, left    chronic/notes 07/18/2013  . CHF (congestive heart failure) ([place])   . Chronic insomnia 05/06/2015  . Chronic kidney disease    frequency, sees dr Jamal Maes every 4 to 6 months (01/16/2018)  . Chronic lower back pain   . Claustrophobia   . Common migraine 05/14/2014  . Coronary artery disease    MI in 2001, 2002, 2006, 2011, 2014  . Depression   . Diabetic peripheral neuropathy ([place]) 01/12/2019  . GERD (gastroesophageal reflux disease)   . H/O hiatal hernia   . Headache    "at least 2/month" (01/16/2018)  . Heart murmur   . Hyperlipidemia   . Hypertension   . Memory change 05/14/2014  . Migraine    "5-6/year"  (01/16/2018)  . Obesity 01-2010  . Obstructive sleep apnea    "can't wear machine; I have claustrophobia" (01/16/2018), states she had a 2nd sleep study and does not have sleep apnea, her O2 decreases and now is on 2 L of O2 at night.  . On home oxygen therapy    "2L at night and prn during daytime" (01/16/2018)  .  Osteoarthritis    "knees and hands" (01/16/2018)  . Peripheral vascular disease (HCC)    ? numbness, tingling arms and legs  . PONV (postoperative nausea and vomiting)   . Stroke [facility])    2014, 2015, 2016  . Type II diabetes mellitus ([place])   . Ventral hernia    hx of    Past Surgical History:  Procedure Laterality Date  . ANKLE FRACTURE SURGERY Left 1970's  . APPENDECTOMY  1970's   w/hysterectomy  . BASAL CELL CARCINOMA EXCISION Left 05/2014   "shoulder" (01/16/2018)  . CARDIAC CATHETERIZATION  10/10/2012   Dr [name].  Marland Kitchen CARDIAC CATHETERIZATION N/A 05/29/2015   Procedure: Right/Left Heart Cath and Coronary/Graft Angiography;  Surgeon: Larey Dresser, MD;  Location: [facility] CV LAB;  Service: Cardiovascular;  Laterality: N/A;  . CAROTID ENDARTERECTOMY Left 03/2013  . CAROTID STENT INSERTION Left 03/20/2013   Procedure: CAROTID STENT INSERTION;  Surgeon: [name], MD;  Location: [facility] CATH LAB;  Service: Cardiovascular;  Laterality: Left;  internal carotid  . CEREBRAL ANGIOGRAM N/A 04/05/2011   Procedure: CEREBRAL ANGIOGRAM;  Surgeon: Angelia Mould, MD;  Location: [facility] CATH LAB;  Service: Cardiovascular;  Laterality: N/A;  .  CHOLECYSTECTOMY OPEN  2004  . CORONARY ANGIOPLASTY WITH STENT PLACEMENT  01,02,05,06,07,08,11; 04/24/2013   "I've probably got ~ 10 stents by now" (04/24/2013)  . CORONARY ANGIOPLASTY WITH STENT PLACEMENT  06/13/2013   "got 4 stents today" (06/13/2013)  . CORONARY ARTERY BYPASS GRAFT  1220/11   "CABG X5"  . CORONARY STENT INTERVENTION N/A 10/20/2017   Procedure: CORONARY STENT INTERVENTION;  Surgeon: Troy Sine, MD;  Location: [facility] CV LAB;  Service: Cardiovascular;  Laterality: N/A;  . ESOPHAGOGASTRODUODENOSCOPY  08/03/2011   Procedure: ESOPHAGOGASTRODUODENOSCOPY (EGD);  Surgeon: Shann Medal, MD;  Location: [facility];  Service: General;  Laterality: N/A;  . ESOPHAGOGASTRODUODENOSCOPY (EGD) WITH PROPOFOL N/A 03/11/2014   Procedure:  ESOPHAGOGASTRODUODENOSCOPY (EGD) WITH PROPOFOL;  Surgeon: Lafayette Dragon, MD;  Location: [facility] ENDOSCOPY;  Service: Endoscopy;  Laterality: N/A;  . FRACTURE SURGERY    . gall stone removal  05/2003  . GASTRIC RESTRICTION SURGERY  1984   "stapeling"  . HERNIA REPAIR  2004   "in my stomach; had OR on it twice", wire mesh on 1 hernia  . LEFT HEART CATH AND CORS/GRAFTS ANGIOGRAPHY N/A 07/09/2016   Procedure: LEFT HEART CATH AND CORS/GRAFTS ANGIOGRAPHY;  Surgeon: Larey Dresser, MD;  Location: [facility];  Service: Cardiovascular;  Laterality: N/A;  . ORIF ANKLE FRACTURE Left 05/16/2018  . ORIF ANKLE FRACTURE Left 05/16/2018   Procedure: OPEN REDUCTION INTERNAL FIXATION (ORIF) Left ankle with possible syndesmosis fixation;  Surgeon: Nicholes Stairs, MD;  Location: [facility];  Service: Orthopedics;  Laterality: Left;  122mn  . OVARY SURGERY  1970's   "tumor removed"  . PERCUTANEOUS CORONARY STENT INTERVENTION (PCI-S) N/A 06/13/2013   Procedure: PERCUTANEOUS CORONARY STENT INTERVENTION (PCI-S);  Surgeon: JJettie Booze MD;  [facility];  Service: Cardiovascular;  Laterality: N/A;  . PERCUTANEOUS STENT INTERVENTION N/A 04/24/2013   Procedure: PERCUTANEOUS STENT INTERVENTION;  Surgeon: JJettie Booze MD;  [facility];  Service: Cardiovascular;  Laterality: N/A;  . RIGHT/LEFT HEART CATH AND CORONARY/GRAFT ANGIOGRAPHY N/A 10/20/2017   Procedure: RIGHT/LEFT HEART CATH AND CORONARY/GRAFT ANGIOGRAPHY;  Surgeon: MLarey Dresser MD;  [facility];  Service: Cardiovascular;  Laterality: N/A;  . ROOT CANAL  10/2000  . TEE WITHOUT CARDIOVERSION N/A 01/18/2019   Procedure: TRANSESOPHAGEAL ECHOCARDIOGRAM (TEE);  Surgeon: MLarey Dresser MD;  [facility];  Service: Cardiovascular;  Laterality: N/A;  . TIBIA FRACTURE SURGERY Right 1970's   rods and pins  . TOOTH EXTRACTION     "1 on the upper; wisdom tooth on the lower" (01/16/2018)  . TOTAL  ABDOMINAL HYSTERECTOMY  1970's   w/ appendectomy    Vitals:   06/21/19 1415  BP: (!) 144/62  Pulse: 83  SpO2: 93%    Subjective Assessment - 06/21/19 1415    Subjective  Pt. reporting three days relief after MT last visit.    Pertinent History  DM II, stroke 2014, 2015, 2016, PVD, supplemental O2 use, migraine, memory change, HTN, HLD, HA, hiatal hernia, GERD, peripheral neuropathy, depression, CAD, claustrophobia, CHF, L BBB, asthma, anxiety, anemia, L ankle ORIF 05/2018, CABG x5 2011    Diagnostic tests  none recent    Patient Stated Goals  "get to walking again and get control of my fatigue"    Currently in Pain?  Yes    Pain Score  9     Pain Location  Back    Pain Orientation  Lower;Right;Left    Pain Descriptors /  Indicators  Aching    Pain Type  Chronic pain    Multiple Pain Sites  No                        [facility] Adult PT Treatment/Exercise - 06/21/19 0001      Knee/Hip Exercises: Standing   Heel Raises  15 reps;Both    Heel Raises Limitations  heel/toe raises    Knee Flexion  Right;Left;10 reps    Knee Flexion Limitations  2#    Hip Flexion  Right;Left;10 reps;Knee bent;Stengthening    Hip Flexion Limitations  2# ankles; RW      Knee/Hip Exercises: Seated   Sit to Sand  10 reps;without UE support   from chair in RW     Modalities   Modalities  Moist Heat      Moist Heat Therapy   Number Minutes Moist Heat  10 Minutes    Moist Heat Location  Lumbar Spine   sitting in chair with 4" step     Manual Therapy   Manual Therapy  Soft tissue mobilization    Manual therapy comments  sidelying with LE resting on bolster    Soft tissue mobilization  STM/DTM to B lumbar paraspinals, superior buttocks, thoracic paraspinals, B rhoimboids, B UT,LS, B posterior shoulders    Pt. noting significant relief from STM/DTM to these areas               PT Short Term Goals - 06/05/19 1403      PT SHORT TERM GOAL #1   Title  Patient to be independent  with initial HEP.    Time  3    Period  Weeks    Status  Achieved    Target Date  04/25/19        PT Long Term Goals - 06/05/19 1403      PT LONG TERM GOAL #1   Title  Patient to be independent with advanced HEP.    Time  6    Period  Weeks    Status  Partially Met   recently limited by feeling unwell   Target Date  07/17/19      PT LONG TERM GOAL #2   Title  Patient to demonstrate B LE strength >/=4+/5.    Time  6    Period  Weeks    Status  Partially Met   slight decline in B hip abduction, R hip flexion, L knee flexion, and R ankle DF/PF   Target Date  07/17/19      PT LONG TERM GOAL #3   Title  Patient to score <14 sec on TUG testing in order to decrease risk of falls.    Baseline  38.34 sec with RW    Time  6    Period  Weeks    Status  Partially Met   06/05/19 26.94 sec with RW   Target Date  07/17/19      PT LONG TERM GOAL #4   Title  Patient to score <12 sec on 5xSTS in order to decrease risk of falls.    Baseline  28.29 sec pushing off from B armrests    Time  6    Period  Weeks    Status  Partially Met   06/05/19 21.5 sec pushing off from B knees with last rep requiring push off from armrests   Target Date  07/17/19      PT LONG TERM GOAL #  5   Title  Patient to report tolerance for 15 min of walking with LRAD without fatigue limiting.    Time  6    Period  Weeks    Status  On-going   reports limited to 3-4 minutes before fatigue   Target Date  07/17/19            Plan - 06/21/19 1455    Clinical Impression Statement  Continued MT focus on today's session for pain relief as pt. with initial subjective report of 9/10 LBP and noted good relief for 3 days after last session's MT.  Noting good relief from 9/10>4/10 after MT thus proceeded with standing LE strengthening as pt. tolerated.  Pt. verbalizing good reduction in overall pain levels today and vitals remained WFL.    Comorbidities  DM II, stroke 2014, 2015, 2016, PVD, supplemental O2 use,  migraine, memory change, HTN, HLD, HA, hiatal hernia, GERD, peripheral neuropathy, depression, CAD, claustrophobia, CHF, L BBB, asthma, anxiety, anemia, L ankle ORIF 05/2018, CABG x5 2011    Rehab Potential  Good    PT Frequency  2x / week    PT Treatment/Interventions  ADLs/Self Care Home Management;Cryotherapy;Electrical Stimulation;Moist Heat;Balance training;Therapeutic exercise;Therapeutic activities;Functional mobility training;Stair training;Gait training;Ultrasound;Neuromuscular re-education;Patient/family education;Manual techniques;Taping;Energy conservation;Dry needling;Passive range of motion    PT Next Visit Plan  LE strengthening; gait training for improved RW safety    Consulted and Agree with Plan of Care  Patient       Patient will benefit from skilled therapeutic intervention in order to improve the following deficits and impairments:  Abnormal gait, Decreased endurance, Cardiopulmonary status limiting activity, Decreased activity tolerance, Decreased strength, Pain, Decreased balance, Decreased mobility, Difficulty walking, Improper body mechanics, Decreased range of motion, Impaired flexibility, Postural dysfunction  Visit Diagnosis: Muscle weakness (generalized)  Unsteadiness on feet  Other abnormalities of gait and mobility  Difficulty in walking, not elsewhere classified     Problem List Patient Active Problem List   Diagnosis Date Noted  . Symptomatic bradycardia 01/16/2019  . Normocytic anemia 01/16/2019  . Diabetic peripheral neuropathy ([place]) 01/12/2019  . Neurologic abnormality 12/25/2018  . Debility 09/21/2018  . Hypotension 09/18/2018  . Bradycardia 09/18/2018  . AKI (acute kidney injury) ([place]) 09/18/2018  . Pressure injury of skin 09/18/2018  . Ischemic cerebrovascular accident (CVA) of frontal lobe ([place]) 09/13/2018  . Fall 09/09/2018  . History of CVA (cerebrovascular accident) 09/09/2018  . Type II diabetes mellitus with renal manifestations ([place])  09/09/2018  . CKD (chronic kidney disease), stage III 09/09/2018  . Sepsis ([place]) 09/09/2018  . Depression with anxiety 09/09/2018  . Slurred speech 09/09/2018  . Ankle fracture, left 05/16/2018  . Amiodarone pulmonary toxicity   . Physical deconditioning   . 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 Acute on chronic congestive heart failure ([place])   . Chronic combined systolic (congestive) and diastolic (congestive) heart failure ([place]) 01/16/2018  . Chronic systolic CHF (congestive heart failure) ([place]) 01/16/2018  . Atrial fibrillation ([place]) 10/21/2017  . CAD (coronary artery disease) of bypass graft 10/20/2017  . Coronary artery disease of bypass graft of native heart with stable angina pectoris ([place])   . Chronic low back pain 08/24/2016  . Pain of left thumb 08/05/2016  . Nocturnal hypoxia 06/02/2016  . Hoarseness 04/05/2016  . Laryngopharyngeal reflux (LPR) 04/05/2016  . Pharyngoesophageal dysphagia 04/05/2016  . Angina decubitus ([place]) 05/22/2015  . Chronic insomnia 05/06/2015  . Common migraine 05/14/2014  . Abnormality of gait 05/14/2014  . Memory change  05/14/2014  . Aneurysm, cerebral, nonruptured 05/14/2014  . Cramp of limb-Left neck 05/10/2014  . Hematemesis with nausea   . Vomiting blood   . Dizziness and giddiness 09/21/2013  . Atypical chest pain 07/18/2013  . Unstable angina ([place]) 04/09/2013  . Carotid artery stenosis, symptomatic 03/20/2013  . Cerebrovascular disease 07/10/2012  . Cerebral artery occlusion with cerebral infarction ([place]) 07/10/2012  . Mitral regurgitation 04/15/2012  . TIA (transient ischemic attack) 04/15/2012  . Occlusion and stenosis of carotid artery without mention of cerebral infarction 08/18/2011  . Bariatric surgery status 06/17/2011  . Speech abnormality 03/22/2011  . Dyspnea 02/24/2011  . PAPILLARY MUSCLE DYSFUNCTION, NON-RHEUMATIC 10/09/2008  . UNSPECIFIED VITAMIN D DEFICIENCY 10/24/2007  . MYOCARDIAL INFARCTION, HX OF 10/24/2007  . PERSISTENT  VOMITING 10/24/2007  . OSTEOARTHRITIS 10/24/2007  . MIGRAINES, HX OF 10/24/2007  . Diabetes mellitus type 2, controlled, with complications ([place]) 61/60/7371  . Hyperlipemia 01/16/2007  . Obesity-post failed open gastroplasty 1984  01/16/2007  . OBSTRUCTIVE SLEEP APNEA 01/16/2007  . Essential hypertension 01/16/2007  . Coronary atherosclerosis 01/16/2007  . Asthma 01/16/2007  . GERD 01/16/2007  . VENTRAL HERNIA 01/16/2007    Bess Harvest, PTA 06/21/19 4:55 PM   [facility] [address]  [place], [place], [address] Phone: [phone]   Fax:  [phone]  Name: JALIYAH FOTHERINGHAM [name]: [id number] Date of Birth: December 29, 1945

## 2019-06-25 ENCOUNTER — Other Ambulatory Visit: Payer: Self-pay

## 2019-06-25 ENCOUNTER — Ambulatory Visit: Payer: Medicare Other

## 2019-06-25 VITALS — BP 148/70 | HR 72

## 2019-06-25 DIAGNOSIS — M6281 Muscle weakness (generalized): Secondary | ICD-10-CM

## 2019-06-25 DIAGNOSIS — R2681 Unsteadiness on feet: Secondary | ICD-10-CM

## 2019-06-25 DIAGNOSIS — R262 Difficulty in walking, not elsewhere classified: Secondary | ICD-10-CM

## 2019-06-25 DIAGNOSIS — R2689 Other abnormalities of gait and mobility: Secondary | ICD-10-CM

## 2019-06-25 NOTE — Therapy (Signed)
[facility] [address]  [place], [place], [address] Phone: [phone]   Fax:  [phone]  Physical Therapy Treatment  Patient Details  Name: [name] [name] [name]: [id number] Date of Birth: 1945/11/05 Referring Provider (PT): Loralie Champagne, MD   Encounter Date: 06/25/2019  PT End of Session - 06/25/19 1420    Visit Number  16    Number of Visits  24    Date for PT Re-Evaluation  07/17/19    Authorization Type  Medicare & AARP    PT Start Time  1405    PT Stop Time  1445    PT Time Calculation (min)  40 min    Activity Tolerance  Patient tolerated treatment well;Patient limited by fatigue    Behavior During Therapy  [facility] for tasks assessed/performed       Past Medical History:  Diagnosis Date  . Anemia    hx  . Anxiety   . Asthma   . Basal cell carcinoma 05/2014   "left shoulder"  . Bundle branch block, left    chronic/notes 07/18/2013  . CHF (congestive heart failure) ([place])   . Chronic insomnia 05/06/2015  . Chronic kidney disease    frequency, sees dr Jamal Maes every 4 to 6 months (01/16/2018)  . Chronic lower back pain   . Claustrophobia   . Common migraine 05/14/2014  . Coronary artery disease    MI in 2001, 2002, 2006, 2011, 2014  . Depression   . Diabetic peripheral neuropathy ([place]) 01/12/2019  . GERD (gastroesophageal reflux disease)   . H/O hiatal hernia   . Headache    "at least 2/month" (01/16/2018)  . Heart murmur   . Hyperlipidemia   . Hypertension   . Memory change 05/14/2014  . Migraine    "5-6/year"  (01/16/2018)  . Obesity 01-2010  . Obstructive sleep apnea    "can't wear machine; I have claustrophobia" (01/16/2018), states she had a 2nd sleep study and does not have sleep apnea, her O2 decreases and now is on 2 L of O2 at night.  . On home oxygen therapy    "2L at night and prn during daytime" (01/16/2018)  . Osteoarthritis    "knees and hands" (01/16/2018)  . Peripheral  vascular disease (HCC)    ? numbness, tingling arms and legs  . PONV (postoperative nausea and vomiting)   . Stroke [facility])    2014, 2015, 2016  . Type II diabetes mellitus ([place])   . Ventral hernia    hx of    Past Surgical History:  Procedure Laterality Date  . ANKLE FRACTURE SURGERY Left 1970's  . APPENDECTOMY  1970's   w/hysterectomy  . BASAL CELL CARCINOMA EXCISION Left 05/2014   "shoulder" (01/16/2018)  . CARDIAC CATHETERIZATION  10/10/2012   Dr [name].  Marland Kitchen CARDIAC CATHETERIZATION N/A 05/29/2015   Procedure: Right/Left Heart Cath and Coronary/Graft Angiography;  Surgeon: Larey Dresser, MD;  Location: [place] CV [facility];  Service: Cardiovascular;  Laterality: N/A;  . CAROTID ENDARTERECTOMY Left 03/2013  . CAROTID STENT INSERTION Left 03/20/2013   Procedure: CAROTID STENT INSERTION;  Surgeon: [name], MD;  Location: [facility] CATH LAB;  Service: Cardiovascular;  Laterality: Left;  internal carotid  . CEREBRAL ANGIOGRAM N/A 04/05/2011   Procedure: CEREBRAL ANGIOGRAM;  Surgeon: Angelia Mould, MD;  Location: [facility] CATH LAB;  Service: Cardiovascular;  Laterality: N/A;  . CHOLECYSTECTOMY OPEN  2004  . CORONARY ANGIOPLASTY WITH STENT PLACEMENT  01,02,05,06,07,08,11; 04/24/2013   "I've probably got ~ 10 stents by now" (04/24/2013)  . CORONARY ANGIOPLASTY WITH STENT PLACEMENT  06/13/2013   "got 4 stents today" (06/13/2013)  . CORONARY ARTERY BYPASS GRAFT  1220/11   "CABG X5"  . CORONARY STENT INTERVENTION N/A 10/20/2017   Procedure: CORONARY STENT INTERVENTION;  Surgeon: [place] Sine, MD;  Location: [place] CV LAB;  Service: Cardiovascular;  Laterality: N/A;  . ESOPHAGOGASTRODUODENOSCOPY  08/03/2011   Procedure: ESOPHAGOGASTRODUODENOSCOPY (EGD);  Surgeon: Shann Medal, MD;  Location: [facility];  Service: General;  Laterality: N/A;  . ESOPHAGOGASTRODUODENOSCOPY (EGD) WITH PROPOFOL N/A 03/11/2014   Procedure: ESOPHAGOGASTRODUODENOSCOPY (EGD) WITH PROPOFOL;  Surgeon: Lafayette Dragon, MD;   Location: [facility]OSCOPY;  Service: Endoscopy;  Laterality: N/A;  . FRACTURE SURGERY    . gall stone removal  05/2003  . GASTRIC RESTRICTION SURGERY  1984   "stapeling"  . HERNIA REPAIR  2004   "in my stomach; had OR on it twice", wire mesh on 1 hernia  . LEFT HEART CATH AND CORS/GRAFTS ANGIOGRAPHY N/A 07/09/2016   Procedure: LEFT HEART CATH AND CORS/GRAFTS ANGIOGRAPHY;  Surgeon: Larey Dresser, MD;  Location: [place] CV LAB;  Service: Cardiovascular;  Laterality: N/A;  . ORIF ANKLE FRACTURE Left 05/16/2018  . ORIF ANKLE FRACTURE Left 05/16/2018   Procedure: OPEN REDUCTION INTERNAL FIXATION (ORIF) Left ankle with possible syndesmosis fixation;  Surgeon: Nicholes Stairs, MD;  Location: [place];  Service: Orthopedics;  Laterality: Left;  187mn  . OVARY SURGERY  1970's   "tumor removed"  . PERCUTANEOUS CORONARY STENT INTERVENTION (PCI-S) N/A 06/13/2013   Procedure: PERCUTANEOUS CORONARY STENT INTERVENTION (PCI-S);  Surgeon: JJettie Booze MD;  [facility];  Service: Cardiovascular;  Laterality: N/A;  . PERCUTANEOUS STENT INTERVENTION N/A 04/24/2013   Procedure: PERCUTANEOUS STENT INTERVENTION;  Surgeon: JJettie Booze MD;  [facility];  Service: Cardiovascular;  Laterality: N/A;  . RIGHT/LEFT HEART CATH AND CORONARY/GRAFT ANGIOGRAPHY N/A 10/20/2017   Procedure: RIGHT/LEFT HEART CATH AND CORONARY/GRAFT ANGIOGRAPHY;  Surgeon: MLarey Dresser MD;  [facility];  Service: Cardiovascular;  Laterality: N/A;  . ROOT CANAL  10/2000  . TEE WITHOUT CARDIOVERSION N/A 01/18/2019   Procedure: TRANSESOPHAGEAL ECHOCARDIOGRAM (TEE);  Surgeon: MLarey Dresser MD;  [facility];  Service: Cardiovascular;  Laterality: N/A;  . TIBIA FRACTURE SURGERY Right 1970's   rods and pins  . TOOTH EXTRACTION     "1 on the upper; wisdom tooth on the lower" (01/16/2018)  . TOTAL ABDOMINAL HYSTERECTOMY  1970's   w/ appendectomy    Vitals:   06/25/19 1411   BP: (!) 148/70  Pulse: 72  SpO2: 93%    Subjective Assessment - 06/25/19 1411    Subjective  Pt. reports massage and home exercise stretch updated last visit has helped to reduce pain.    Pertinent History  DM II, stroke 2014, 2015, 2016, PVD, supplemental O2 use, migraine, memory change, HTN, HLD, HA, hiatal hernia, GERD, peripheral neuropathy, depression, CAD, claustrophobia, CHF, L BBB, asthma, anxiety, anemia, L ankle ORIF 05/2018, CABG x5 2011    Diagnostic tests  none recent    Patient Stated Goals  "get to walking again and get control of my fatigue"    Currently in Pain?  Yes    Pain Score  6     Pain Location  Back    Pain Orientation  Lower;Right;Left    Pain Descriptors / Indicators  Aching  Pain Type  Chronic pain    Multiple Pain Sites  No                        [facility] Adult PT Treatment/Exercise - 06/25/19 0001      Lumbar Exercises: Stretches   Lower Trunk Rotation Limitations  5" x 10 reps     Lumbar Stabilization Level 1  5 reps;10 seconds    Lumbar Stabilization Level 1 Limitations  green p-ball rollouts from seated on table       Lumbar Exercises: Supine   Bridge  15 reps;3 seconds      Knee/Hip Exercises: Aerobic   Nustep  Lvl 3, 6 min (UEs/LEs)      Knee/Hip Exercises: Standing   Hip Abduction  Left;Right;10 reps;Knee straight;Stengthening    Abduction Limitations  yellow looped TB in RW    Hip Extension  Right;Left;10 reps;Knee straight;Stengthening    Extension Limitations  yellow looped TB at ankles     Forward Step Up  Right;Left;5 reps;Step Height: 4";Hand Hold: 2    Forward Step Up Limitations  at TM rail and RW support       Knee/Hip Exercises: Seated   Long Arc Quad  Right;Left;10 reps;1 set    [employer] Limitations  with yellow looped TB sitting              PT Education - 06/25/19 1425    Education Details  HEP update;  lumbar stretch    Person(s) Educated  Patient    Methods   Explanation;Demonstration;Verbal cues;Handout    Comprehension  Verbalized understanding;Returned demonstration;Verbal cues required       PT Short Term Goals - 06/05/19 1403      PT SHORT TERM GOAL #1   Title  Patient to be independent with initial HEP.    Time  3    Period  Weeks    Status  Achieved    Target Date  04/25/19        PT Long Term Goals - 06/05/19 1403      PT LONG TERM GOAL #1   Title  Patient to be independent with advanced HEP.    Time  6    Period  Weeks    Status  Partially Met   recently limited by feeling unwell   Target Date  07/17/19      PT LONG TERM GOAL #2   Title  Patient to demonstrate B LE strength >/=4+/5.    Time  6    Period  Weeks    Status  Partially Met   slight decline in B hip abduction, R hip flexion, L knee flexion, and R ankle DF/PF   Target Date  07/17/19      PT LONG TERM GOAL #3   Title  Patient to score <14 sec on TUG testing in order to decrease risk of falls.    Baseline  38.34 sec with RW    Time  6    Period  Weeks    Status  Partially Met   06/05/19 26.94 sec with RW   Target Date  07/17/19      PT LONG TERM GOAL #4   Title  Patient to score <12 sec on 5xSTS in order to decrease risk of falls.    Baseline  28.29 sec pushing off from B armrests    Time  6    Period  Weeks    Status  Partially Met   06/05/19 21.5 sec pushing off from B knees with last rep requiring push off from armrests   Target Date  07/17/19      PT LONG TERM GOAL #5   Title  Patient to report tolerance for 15 min of walking with LRAD without fatigue limiting.    Time  6    Period  Weeks    Status  On-going   reports limited to 3-4 minutes before fatigue   Target Date  07/17/19            Plan - 06/25/19 1421    Clinical Impression Statement  [name] [name] noting some overall improvement in LBP with decreased initial subjective report to start session.  Focused on gentle lumbopelvic stretching and standing LE strengthening to [name] [name]'s  tolerance today.  Vitals remained WFL throughout session.  Pt. seems to be improving LE endurance with slight reduction in shortness of breath with standing therex requiring decreased sitting rest break times for recovery.    Comorbidities  DM II, stroke 2014, 2015, 2016, PVD, supplemental O2 use, migraine, memory change, HTN, HLD, HA, hiatal hernia, GERD, peripheral neuropathy, depression, CAD, claustrophobia, CHF, L BBB, asthma, anxiety, anemia, L ankle ORIF 05/2018, CABG x5 2011    Rehab Potential  Good    PT Frequency  2x / week    PT Treatment/Interventions  ADLs/Self Care Home Management;Cryotherapy;Electrical Stimulation;Moist Heat;Balance training;Therapeutic exercise;Therapeutic activities;Functional mobility training;Stair training;Gait training;Ultrasound;Neuromuscular re-education;Patient/family education;Manual techniques;Taping;Energy conservation;Dry needling;Passive range of motion    PT Next Visit Plan  LE strengthening; gait training for improved RW safety    Consulted and Agree with Plan of Care  Patient       Patient will benefit from skilled therapeutic intervention in order to improve the following deficits and impairments:  Abnormal gait, Decreased endurance, Cardiopulmonary status limiting activity, Decreased activity tolerance, Decreased strength, Pain, Decreased balance, Decreased mobility, Difficulty walking, Improper body mechanics, Decreased range of motion, Impaired flexibility, Postural dysfunction  Visit Diagnosis: Muscle weakness (generalized)  Unsteadiness on feet  Other abnormalities of gait and mobility  Difficulty in walking, not elsewhere classified     Problem List Patient Active Problem List   Diagnosis Date Noted  . Symptomatic bradycardia 01/16/2019  . Normocytic anemia 01/16/2019  . Diabetic peripheral neuropathy ([place]) 01/12/2019  . Neurologic abnormality 12/25/2018  . Debility 09/21/2018  . Hypotension 09/18/2018  . Bradycardia 09/18/2018   . AKI (acute kidney injury) ([place]) 09/18/2018  . Pressure injury of skin 09/18/2018  . Ischemic cerebrovascular accident (CVA) of frontal lobe ([place]) 09/13/2018  . Fall 09/09/2018  . History of CVA (cerebrovascular accident) 09/09/2018  . Type II diabetes mellitus with renal manifestations ([place]) 09/09/2018  . CKD (chronic kidney disease), stage III 09/09/2018  . Sepsis ([place]) 09/09/2018  . Depression with anxiety 09/09/2018  . Slurred speech 09/09/2018  . Ankle fracture, left 05/16/2018  . Amiodarone pulmonary toxicity   . Physical deconditioning   . 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 Acute on chronic congestive heart failure ([place])   . Chronic combined systolic (congestive) and diastolic (congestive) heart failure ([place]) 01/16/2018  . Chronic systolic CHF (congestive heart failure) ([place]) 01/16/2018  . Atrial fibrillation ([place]) 10/21/2017  . CAD (coronary artery disease) of bypass graft 10/20/2017  . Coronary artery disease of bypass graft of native heart with stable angina pectoris ([place])   . Chronic low back pain 08/24/2016  . Pain of left thumb 08/05/2016  . Nocturnal hypoxia 06/02/2016  . Hoarseness 04/05/2016  .  Laryngopharyngeal reflux (LPR) 04/05/2016  . Pharyngoesophageal dysphagia 04/05/2016  . Angina decubitus ([place]) 05/22/2015  . Chronic insomnia 05/06/2015  . Common migraine 05/14/2014  . Abnormality of gait 05/14/2014  . Memory change 05/14/2014  . Aneurysm, cerebral, nonruptured 05/14/2014  . Cramp of limb-Left neck 05/10/2014  . Hematemesis with nausea   . Vomiting blood   . Dizziness and giddiness 09/21/2013  . Atypical chest pain 07/18/2013  . Unstable angina ([place]) 04/09/2013  . Carotid artery stenosis, symptomatic 03/20/2013  . Cerebrovascular disease 07/10/2012  . Cerebral artery occlusion with cerebral infarction ([place]) 07/10/2012  . Mitral regurgitation 04/15/2012  . TIA (transient ischemic attack) 04/15/2012  . Occlusion and stenosis of carotid artery without  mention of cerebral infarction 08/18/2011  . Bariatric surgery status 06/17/2011  . Speech abnormality 03/22/2011  . Dyspnea 02/24/2011  . PAPILLARY MUSCLE DYSFUNCTION, NON-RHEUMATIC 10/09/2008  . UNSPECIFIED VITAMIN D DEFICIENCY 10/24/2007  . MYOCARDIAL INFARCTION, HX OF 10/24/2007  . PERSISTENT VOMITING 10/24/2007  . OSTEOARTHRITIS 10/24/2007  . MIGRAINES, HX OF 10/24/2007  . Diabetes mellitus type 2, controlled, with complications ([place]) 83/16/7425  . Hyperlipemia 01/16/2007  . Obesity-post failed open gastroplasty 1984  01/16/2007  . OBSTRUCTIVE SLEEP APNEA 01/16/2007  . Essential hypertension 01/16/2007  . Coronary atherosclerosis 01/16/2007  . Asthma 01/16/2007  . GERD 01/16/2007  . VENTRAL HERNIA 01/16/2007    [name] [name], PTA 06/25/19 6:28 PM   [facility] [address]  [place], [place], [address] Phone: [phone]   Fax:  [phone]  Name: [name] [name] [name]: [id number] Date of Birth: 05-16-1945

## 2019-06-26 ENCOUNTER — Telehealth (HOSPITAL_COMMUNITY): Payer: Self-pay

## 2019-06-26 NOTE — Telephone Encounter (Signed)
HH orders signed and faxed back to [facility].

## 2019-06-28 ENCOUNTER — Ambulatory Visit: Payer: Medicare Other

## 2019-06-28 ENCOUNTER — Other Ambulatory Visit: Payer: Self-pay

## 2019-06-28 VITALS — BP 160/78 | HR 78

## 2019-06-28 DIAGNOSIS — M6281 Muscle weakness (generalized): Secondary | ICD-10-CM

## 2019-06-28 DIAGNOSIS — R2689 Other abnormalities of gait and mobility: Secondary | ICD-10-CM

## 2019-06-28 DIAGNOSIS — R2681 Unsteadiness on feet: Secondary | ICD-10-CM

## 2019-06-28 DIAGNOSIS — R262 Difficulty in walking, not elsewhere classified: Secondary | ICD-10-CM

## 2019-06-28 NOTE — Therapy (Signed)
[facility] [address]  [place], [place], [address] Phone: [phone]   Fax:  [phone]  Physical Therapy Treatment  Patient Details  Name: [name] [name] [name]: [id number] Date of Birth: May 26, 1945 Referring Provider (PT): Loralie Champagne, MD   Encounter Date: 06/28/2019  PT End of Session - 06/28/19 1406    Visit Number  17    Number of Visits  24    Date for PT Re-Evaluation  07/17/19    Authorization Type  Medicare & AARP    PT Start Time  0109    PT Stop Time  1444    PT Time Calculation (min)  46 min    Activity Tolerance  Patient tolerated treatment well;Patient limited by fatigue    Behavior During Therapy  [facility] for tasks assessed/performed       Past Medical History:  Diagnosis Date  . Anemia    hx  . Anxiety   . Asthma   . Basal cell carcinoma 05/2014   "left shoulder"  . Bundle branch block, left    chronic/notes 07/18/2013  . CHF (congestive heart failure) ([place])   . Chronic insomnia 05/06/2015  . Chronic kidney disease    frequency, sees dr Jamal Maes every 4 to 6 months (01/16/2018)  . Chronic lower back pain   . Claustrophobia   . Common migraine 05/14/2014  . Coronary artery disease    MI in 2001, 2002, 2006, 2011, 2014  . Depression   . Diabetic peripheral neuropathy ([place]) 01/12/2019  . GERD (gastroesophageal reflux disease)   . H/O hiatal hernia   . Headache    "at least 2/month" (01/16/2018)  . Heart murmur   . Hyperlipidemia   . Hypertension   . Memory change 05/14/2014  . Migraine    "5-6/year"  (01/16/2018)  . Obesity 01-2010  . Obstructive sleep apnea    "can't wear machine; I have claustrophobia" (01/16/2018), states she had a 2nd sleep study and does not have sleep apnea, her O2 decreases and now is on 2 L of O2 at night.  . On home oxygen therapy    "2L at night and prn during daytime" (01/16/2018)  . Osteoarthritis    "knees and hands" (01/16/2018)  . Peripheral  vascular disease (HCC)    ? numbness, tingling arms and legs  . PONV (postoperative nausea and vomiting)   . Stroke [facility])    2014, 2015, 2016  . Type II diabetes mellitus ([place])   . Ventral hernia    hx of    Past Surgical History:  Procedure Laterality Date  . ANKLE FRACTURE SURGERY Left 1970's  . APPENDECTOMY  1970's   w/hysterectomy  . BASAL CELL CARCINOMA EXCISION Left 05/2014   "shoulder" (01/16/2018)  . CARDIAC CATHETERIZATION  10/10/2012   Dr [name].  Marland Kitchen CARDIAC CATHETERIZATION N/A 05/29/2015   Procedure: Right/Left Heart Cath and Coronary/Graft Angiography;  Surgeon: Larey Dresser, MD;  Location: [place] CV LAB;  Service: Cardiovascular;  Laterality: N/A;  . CAROTID ENDARTERECTOMY Left 03/2013  . CAROTID STENT INSERTION Left 03/20/2013   Procedure: CAROTID STENT INSERTION;  Surgeon: [name], MD;  Location: [facility] CATH LAB;  Service: Cardiovascular;  Laterality: Left;  internal carotid  . CEREBRAL ANGIOGRAM N/A 04/05/2011   Procedure: CEREBRAL ANGIOGRAM;  Surgeon: Angelia Mould, MD;  Location: [facility] CATH LAB;  Service: Cardiovascular;  Laterality: N/A;  . CHOLECYSTECTOMY OPEN  2004  . CORONARY ANGIOPLASTY WITH STENT PLACEMENT  01,02,05,06,07,08,11; 04/24/2013   "I've probably got ~ 10 stents by now" (04/24/2013)  . CORONARY ANGIOPLASTY WITH STENT PLACEMENT  06/13/2013   "got 4 stents today" (06/13/2013)  . CORONARY ARTERY BYPASS GRAFT  1220/11   "CABG X5"  . CORONARY STENT INTERVENTION N/A 10/20/2017   Procedure: CORONARY STENT INTERVENTION;  Surgeon: Troy Sine, MD;  Location: [place] CV LAB;  Service: Cardiovascular;  Laterality: N/A;  . ESOPHAGOGASTRODUODENOSCOPY  08/03/2011   Procedure: ESOPHAGOGASTRODUODENOSCOPY (EGD);  Surgeon: Shann Medal, MD;  Location: [facility];  Service: General;  Laterality: N/A;  . ESOPHAGOGASTRODUODENOSCOPY (EGD) WITH PROPOFOL N/A 03/11/2014   Procedure: ESOPHAGOGASTRODUODENOSCOPY (EGD) WITH PROPOFOL;  Surgeon: Lafayette Dragon, MD;   Location: [facility] ENDOSCOPY;  Service: Endoscopy;  Laterality: N/A;  . FRACTURE SURGERY    . gall stone removal  05/2003  . GASTRIC RESTRICTION SURGERY  1984   "stapeling"  . HERNIA REPAIR  2004   "in my stomach; had OR on it twice", wire mesh on 1 hernia  . LEFT HEART CATH AND CORS/GRAFTS ANGIOGRAPHY N/A 07/09/2016   Procedure: LEFT HEART CATH AND CORS/GRAFTS ANGIOGRAPHY;  Surgeon: Larey Dresser, MD;  Location: [place] CV LAB;  Service: Cardiovascular;  Laterality: N/A;  . ORIF ANKLE FRACTURE Left 05/16/2018  . ORIF ANKLE FRACTURE Left 05/16/2018   Procedure: OPEN REDUCTION INTERNAL FIXATION (ORIF) Left ankle with possible syndesmosis fixation;  Surgeon: Nicholes Stairs, MD;  Location: [place];  Service: Orthopedics;  Laterality: Left;  160mn  . OVARY SURGERY  1970's   "tumor removed"  . PERCUTANEOUS CORONARY STENT INTERVENTION (PCI-S) N/A 06/13/2013   Procedure: PERCUTANEOUS CORONARY STENT INTERVENTION (PCI-S);  Surgeon: JJettie Booze MD;  [facility];  Service: Cardiovascular;  Laterality: N/A;  . PERCUTANEOUS STENT INTERVENTION N/A 04/24/2013   Procedure: PERCUTANEOUS STENT INTERVENTION;  Surgeon: JJettie Booze MD;  [facility];  Service: Cardiovascular;  Laterality: N/A;  . RIGHT/LEFT HEART CATH AND CORONARY/GRAFT ANGIOGRAPHY N/A 10/20/2017   Procedure: RIGHT/LEFT HEART CATH AND CORONARY/GRAFT ANGIOGRAPHY;  Surgeon: MLarey Dresser MD;  [facility];  Service: Cardiovascular;  Laterality: N/A;  . ROOT CANAL  10/2000  . TEE WITHOUT CARDIOVERSION N/A 01/18/2019   Procedure: TRANSESOPHAGEAL ECHOCARDIOGRAM (TEE);  Surgeon: MLarey Dresser MD;  [facility];  Service: Cardiovascular;  Laterality: N/A;  . TIBIA FRACTURE SURGERY Right 1970's   rods and pins  . TOOTH EXTRACTION     "1 on the upper; wisdom tooth on the lower" (01/16/2018)  . TOTAL ABDOMINAL HYSTERECTOMY  1970's   w/ appendectomy    Vitals:   06/28/19 1405   BP: (!) 160/78  Pulse: 78  SpO2: 97%    Subjective Assessment - 06/28/19 1405    Subjective  Pt. noting she has been depressed the last few days.  Patient feels she needs to help her husband with household chores such as vacuuming and cleaning sinks/kitchen however does not feel able to safely due to her balance.    Pertinent History  DM II, stroke 2014, 2015, 2016, PVD, supplemental O2 use, migraine, memory change, HTN, HLD, HA, hiatal hernia, GERD, peripheral neuropathy, depression, CAD, claustrophobia, CHF, L BBB, asthma, anxiety, anemia, L ankle ORIF 05/2018, CABG x5 2011    Diagnostic tests  none recent    Patient Stated Goals  "get to walking again and get control of my fatigue"    Currently in Pain?  Yes    Pain Score  5  Pain Location  Back    Pain Orientation  Lower;Right;Left    Pain Descriptors / Indicators  Aching    Pain Type  Chronic pain    Multiple Pain Sites  No                        [facility] Adult PT Treatment/Exercise - 06/28/19 0001      Ambulation/Gait   Ambulation/Gait  Yes    Ambulation/Gait Assistance  5: Supervision    Ambulation/Gait Assistance Details  cues to stay close to rollator and lock brace during standing rest break     Ambulation Distance (Feet)  90 Feet    Assistive device  Rollator    Gait Pattern  Step-through pattern;Decreased step length - right;Decreased step length - left;Trunk flexed      Self-Care   Other Self-Care Comments   reviewed activity pacing and strategies with rollator and RW to perform housework tasks such vacuuming, sweeping, loading/unloading dishwasher       Knee/Hip Exercises: Standing   Heel Raises  Both;20 reps   in Rollator    Heel Raises Limitations  heel/toe raises    Knee Flexion  Right;Left;10 reps    Knee Flexion Limitations  in rollator     Hip Flexion  Right;Left;10 reps;Knee bent;Stengthening    Hip Flexion Limitations  2# ankles; RW               PT Short Term Goals -  06/05/19 1403      PT SHORT TERM GOAL #1   Title  Patient to be independent with initial HEP.    Time  3    Period  Weeks    Status  Achieved    Target Date  04/25/19        PT Long Term Goals - 06/05/19 1403      PT LONG TERM GOAL #1   Title  Patient to be independent with advanced HEP.    Time  6    Period  Weeks    Status  Partially Met   recently limited by feeling unwell   Target Date  07/17/19      PT LONG TERM GOAL #2   Title  Patient to demonstrate B LE strength >/=4+/5.    Time  6    Period  Weeks    Status  Partially Met   slight decline in B hip abduction, R hip flexion, L knee flexion, and R ankle DF/PF   Target Date  07/17/19      PT LONG TERM GOAL #3   Title  Patient to score <14 sec on TUG testing in order to decrease risk of falls.    Baseline  38.34 sec with RW    Time  6    Period  Weeks    Status  Partially Met   06/05/19 26.94 sec with RW   Target Date  07/17/19      PT LONG TERM GOAL #4   Title  Patient to score <12 sec on 5xSTS in order to decrease risk of falls.    Baseline  28.29 sec pushing off from B armrests    Time  6    Period  Weeks    Status  Partially Met   06/05/19 21.5 sec pushing off from B knees with last rep requiring push off from armrests   Target Date  07/17/19      PT LONG TERM GOAL #5  Title  Patient to report tolerance for 15 min of walking with LRAD without fatigue limiting.    Time  6    Period  Weeks    Status  On-going   reports limited to 3-4 minutes before fatigue   Target Date  07/17/19            Plan - 06/28/19 1406    Clinical Impression Statement  Patient admitting that she has been depressed over the last few days, "I've just cried", as she feels her ability to perform household tasks (vacuuming, sweeping, meal prep) while her husband was away from the home was limited.  Pt. expressing that she wishes to be more able to help her husband with household tasks however feels limited by her endurance  and balance with RW.  Did discuss energy conservation strategies and use of rollator to help transport plates of food/drinks to and from kitchen table.  Pt. reporting he large rollator cannot fit through some of her narrow doorways in her home thus she does not use this device much.  Discussed purchase of more appropriately sized rollator to assist patient in transport of small items in her home as pt. has primarily been using RW.  Discussed strategies for safe preparation of food in kitchen and strategies for vacuuming to improve patient's independence with pt. verbalizing understanding.  Gait training today in clinic with rollator focused on patient awareness of locking brakes during transfers, safe spacing from rollator during ambulation with good carryover.  Duration of session focused on LE strengthening for improved muscular endurance and LE stability.  Pt. leaving session with visible improvement in mood and verbalizing plans to pursue purchase of standard rollator for improved home mobility.  .    Comorbidities  DM II, stroke 2014, 2015, 2016, PVD, supplemental O2 use, migraine, memory change, HTN, HLD, HA, hiatal hernia, GERD, peripheral neuropathy, depression, CAD, claustrophobia, CHF, L BBB, asthma, anxiety, anemia, L ankle ORIF 05/2018, CABG x5 2011    Rehab Potential  Good    PT Frequency  2x / week    PT Treatment/Interventions  ADLs/Self Care Home Management;Cryotherapy;Electrical Stimulation;Moist Heat;Balance training;Therapeutic exercise;Therapeutic activities;Functional mobility training;Stair training;Gait training;Ultrasound;Neuromuscular re-education;Patient/family education;Manual techniques;Taping;Energy conservation;Dry needling;Passive range of motion    PT Next Visit Plan  LE strengthening; gait training for improved RW safety    Consulted and Agree with Plan of Care  Patient       Patient will benefit from skilled therapeutic intervention in order to improve the following  deficits and impairments:  Abnormal gait, Decreased endurance, Cardiopulmonary status limiting activity, Decreased activity tolerance, Decreased strength, Pain, Decreased balance, Decreased mobility, Difficulty walking, Improper body mechanics, Decreased range of motion, Impaired flexibility, Postural dysfunction  Visit Diagnosis: Muscle weakness (generalized)  Unsteadiness on feet  Other abnormalities of gait and mobility  Difficulty in walking, not elsewhere classified     Problem List Patient Active Problem List   Diagnosis Date Noted  . Symptomatic bradycardia 01/16/2019  . Normocytic anemia 01/16/2019  . Diabetic peripheral neuropathy ([place]) 01/12/2019  . Neurologic abnormality 12/25/2018  . Debility 09/21/2018  . Hypotension 09/18/2018  . Bradycardia 09/18/2018  . AKI (acute kidney injury) ([place]) 09/18/2018  . Pressure injury of skin 09/18/2018  . Ischemic cerebrovascular accident (CVA) of frontal lobe ([place]) 09/13/2018  . Fall 09/09/2018  . History of CVA (cerebrovascular accident) 09/09/2018  . Type II diabetes mellitus with renal manifestations ([place]) 09/09/2018  . CKD (chronic kidney disease), stage III 09/09/2018  . Sepsis ([place]) 09/09/2018  .  Depression with anxiety 09/09/2018  . Slurred speech 09/09/2018  . Ankle fracture, left 05/16/2018  . Amiodarone pulmonary toxicity   . Physical deconditioning   . 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 Acute on chronic congestive heart failure ([place])   . Chronic combined systolic (congestive) and diastolic (congestive) heart failure ([place]) 01/16/2018  . Chronic systolic CHF (congestive heart failure) ([place]) 01/16/2018  . Atrial fibrillation ([place]) 10/21/2017  . CAD (coronary artery disease) of bypass graft 10/20/2017  . Coronary artery disease of bypass graft of native heart with stable angina pectoris ([place])   . Chronic low back pain 08/24/2016  . Pain of left thumb 08/05/2016  . Nocturnal hypoxia 06/02/2016  . Hoarseness 04/05/2016   . Laryngopharyngeal reflux (LPR) 04/05/2016  . Pharyngoesophageal dysphagia 04/05/2016  . Angina decubitus ([place]) 05/22/2015  . Chronic insomnia 05/06/2015  . Common migraine 05/14/2014  . Abnormality of gait 05/14/2014  . Memory change 05/14/2014  . Aneurysm, cerebral, nonruptured 05/14/2014  . Cramp of limb-Left neck 05/10/2014  . Hematemesis with nausea   . Vomiting blood   . Dizziness and giddiness 09/21/2013  . Atypical chest pain 07/18/2013  . Unstable angina ([place]) 04/09/2013  . Carotid artery stenosis, symptomatic 03/20/2013  . Cerebrovascular disease 07/10/2012  . Cerebral artery occlusion with cerebral infarction ([place]) 07/10/2012  . Mitral regurgitation 04/15/2012  . TIA (transient ischemic attack) 04/15/2012  . Occlusion and stenosis of carotid artery without mention of cerebral infarction 08/18/2011  . Bariatric surgery status 06/17/2011  . Speech abnormality 03/22/2011  . Dyspnea 02/24/2011  . PAPILLARY MUSCLE DYSFUNCTION, NON-RHEUMATIC 10/09/2008  . UNSPECIFIED VITAMIN D DEFICIENCY 10/24/2007  . MYOCARDIAL INFARCTION, HX OF 10/24/2007  . PERSISTENT VOMITING 10/24/2007  . OSTEOARTHRITIS 10/24/2007  . MIGRAINES, HX OF 10/24/2007  . Diabetes mellitus type 2, controlled, with complications ([place]) 39/76/7341  . Hyperlipemia 01/16/2007  . Obesity-post failed open gastroplasty 1984  01/16/2007  . OBSTRUCTIVE SLEEP APNEA 01/16/2007  . Essential hypertension 01/16/2007  . Coronary atherosclerosis 01/16/2007  . Asthma 01/16/2007  . GERD 01/16/2007  . VENTRAL HERNIA 01/16/2007    Bess Harvest, PTA 06/28/19 6:27 PM   [facility] [address]  [place], [place], [address] Phone: [phone]   Fax:  [phone]  Name: [name] [name] [name]: [id number] Date of Birth: Nov 30, 1945

## 2019-07-03 ENCOUNTER — Ambulatory Visit: Payer: Medicare Other | Attending: Cardiology

## 2019-07-03 ENCOUNTER — Other Ambulatory Visit: Payer: Self-pay

## 2019-07-03 VITALS — BP 130/70 | HR 73

## 2019-07-03 DIAGNOSIS — R5381 Other malaise: Secondary | ICD-10-CM | POA: Diagnosis present

## 2019-07-03 DIAGNOSIS — I679 Cerebrovascular disease, unspecified: Secondary | ICD-10-CM | POA: Diagnosis present

## 2019-07-03 DIAGNOSIS — R2689 Other abnormalities of gait and mobility: Secondary | ICD-10-CM | POA: Insufficient documentation

## 2019-07-03 DIAGNOSIS — R262 Difficulty in walking, not elsewhere classified: Secondary | ICD-10-CM | POA: Diagnosis present

## 2019-07-03 DIAGNOSIS — I952 Hypotension due to drugs: Secondary | ICD-10-CM | POA: Insufficient documentation

## 2019-07-03 DIAGNOSIS — M6281 Muscle weakness (generalized): Secondary | ICD-10-CM | POA: Diagnosis not present

## 2019-07-03 DIAGNOSIS — R2681 Unsteadiness on feet: Secondary | ICD-10-CM | POA: Diagnosis present

## 2019-07-03 NOTE — Therapy (Signed)
[facility] [address]  [place], [place], [address] Phone: [phone]   Fax:  [phone]  Physical Therapy Treatment  Patient Details  Name: [name] [name] [name]: [id number] Date of Birth: 12/17/1945 Referring Provider (PT): Loralie Champagne, MD   Encounter Date: 07/03/2019  PT End of Session - 07/03/19 1425    Visit Number  18    Number of Visits  24    Date for PT Re-Evaluation  07/17/19    Authorization Type  Medicare & AARP    PT Start Time  1400    PT Stop Time  1438    PT Time Calculation (min)  38 min    Activity Tolerance  Patient tolerated treatment well;Patient limited by fatigue    Behavior During Therapy  [facility] for tasks assessed/performed       Past Medical History:  Diagnosis Date  . Anemia    hx  . Anxiety   . Asthma   . Basal cell carcinoma 05/2014   "left shoulder"  . Bundle branch block, left    chronic/notes 07/18/2013  . CHF (congestive heart failure) ([place])   . Chronic insomnia 05/06/2015  . Chronic kidney disease    frequency, sees dr Jamal Maes every 4 to 6 months (01/16/2018)  . Chronic lower back pain   . Claustrophobia   . Common migraine 05/14/2014  . Coronary artery disease    MI in 2001, 2002, 2006, 2011, 2014  . Depression   . Diabetic peripheral neuropathy ([place]) 01/12/2019  . GERD (gastroesophageal reflux disease)   . H/O hiatal hernia   . Headache    "at least 2/month" (01/16/2018)  . Heart murmur   . Hyperlipidemia   . Hypertension   . Memory change 05/14/2014  . Migraine    "5-6/year"  (01/16/2018)  . Obesity 01-2010  . Obstructive sleep apnea    "can't wear machine; I have claustrophobia" (01/16/2018), states she had a 2nd sleep study and does not have sleep apnea, her O2 decreases and now is on 2 L of O2 at night.  . On home oxygen therapy    "2L at night and prn during daytime" (01/16/2018)  . Osteoarthritis    "knees and hands" (01/16/2018)  . Peripheral  vascular disease (HCC)    ? numbness, tingling arms and legs  . PONV (postoperative nausea and vomiting)   . Stroke [facility])    2014, 2015, 2016  . Type II diabetes mellitus ([place])   . Ventral hernia    hx of    Past Surgical History:  Procedure Laterality Date  . ANKLE FRACTURE SURGERY Left 1970's  . APPENDECTOMY  1970's   w/hysterectomy  . BASAL CELL CARCINOMA EXCISION Left 05/2014   "shoulder" (01/16/2018)  . CARDIAC CATHETERIZATION  10/10/2012   Dr [name].  Marland Kitchen CARDIAC CATHETERIZATION N/A 05/29/2015   Procedure: Right/Left Heart Cath and Coronary/Graft Angiography;  Surgeon: Larey Dresser, MD;  Location: [facility];  Service: Cardiovascular;  Laterality: N/A;  . CAROTID ENDARTERECTOMY Left 03/2013  . CAROTID STENT INSERTION Left 03/20/2013   Procedure: CAROTID STENT INSERTION;  Surgeon: [name], MD;  Location: [facility] CATH LAB;  Service: Cardiovascular;  Laterality: Left;  internal carotid  . CEREBRAL ANGIOGRAM N/A 04/05/2011   Procedure: CEREBRAL ANGIOGRAM;  Surgeon: Angelia Mould, MD;  Location: [facility] CATH LAB;  Service: Cardiovascular;  Laterality: N/A;  . CHOLECYSTECTOMY OPEN  2004  . CORONARY ANGIOPLASTY WITH STENT PLACEMENT  01,02,05,06,07,08,11; 04/24/2013   "I've probably got ~ 10 stents by now" (04/24/2013)  . CORONARY ANGIOPLASTY WITH STENT PLACEMENT  06/13/2013   "got 4 stents today" (06/13/2013)  . CORONARY ARTERY BYPASS GRAFT  1220/11   "CABG X5"  . CORONARY STENT INTERVENTION N/A 10/20/2017   Procedure: CORONARY STENT INTERVENTION;  Surgeon: Troy Sine, MD;  Location: [facility];  Service: Cardiovascular;  Laterality: N/A;  . ESOPHAGOGASTRODUODENOSCOPY  08/03/2011   Procedure: ESOPHAGOGASTRODUODENOSCOPY (EGD);  Surgeon: Shann Medal, MD;  Location: [facility];  Service: General;  Laterality: N/A;  . ESOPHAGOGASTRODUODENOSCOPY (EGD) WITH PROPOFOL N/A 03/11/2014   Procedure: ESOPHAGOGASTRODUODENOSCOPY (EGD) WITH PROPOFOL;  Surgeon: Lafayette Dragon, MD;   Location: [facility] ENDOSCOPY;  Service: Endoscopy;  Laterality: N/A;  . FRACTURE SURGERY    . gall stone removal  05/2003  . GASTRIC RESTRICTION SURGERY  1984   "stapeling"  . HERNIA REPAIR  2004   "in my stomach; had OR on it twice", wire mesh on 1 hernia  . LEFT HEART CATH AND CORS/GRAFTS ANGIOGRAPHY N/A 07/09/2016   Procedure: LEFT HEART CATH AND CORS/GRAFTS ANGIOGRAPHY;  Surgeon: Larey Dresser, MD;  Location: [facility];  Service: Cardiovascular;  Laterality: N/A;  . ORIF ANKLE FRACTURE Left 05/16/2018  . ORIF ANKLE FRACTURE Left 05/16/2018   Procedure: OPEN REDUCTION INTERNAL FIXATION (ORIF) Left ankle with possible syndesmosis fixation;  Surgeon: Nicholes Stairs, MD;  Location: [facility];  Service: Orthopedics;  Laterality: Left;  149mn  . OVARY SURGERY  1970's   "tumor removed"  . PERCUTANEOUS CORONARY STENT INTERVENTION (PCI-S) N/A 06/13/2013   Procedure: PERCUTANEOUS CORONARY STENT INTERVENTION (PCI-S);  Surgeon: JJettie Booze MD;  [facility];  Service: Cardiovascular;  Laterality: N/A;  . PERCUTANEOUS STENT INTERVENTION N/A 04/24/2013   Procedure: PERCUTANEOUS STENT INTERVENTION;  Surgeon: JJettie Booze MD;  [facility];  Service: Cardiovascular;  Laterality: N/A;  . RIGHT/LEFT HEART CATH AND CORONARY/GRAFT ANGIOGRAPHY N/A 10/20/2017   Procedure: RIGHT/LEFT HEART CATH AND CORONARY/GRAFT ANGIOGRAPHY;  Surgeon: MLarey Dresser MD;  [facility];  Service: Cardiovascular;  Laterality: N/A;  . ROOT CANAL  10/2000  . TEE WITHOUT CARDIOVERSION N/A 01/18/2019   Procedure: TRANSESOPHAGEAL ECHOCARDIOGRAM (TEE);  Surgeon: MLarey Dresser MD;  [facility];  Service: Cardiovascular;  Laterality: N/A;  . TIBIA FRACTURE SURGERY Right 1970's   rods and pins  . TOOTH EXTRACTION     "1 on the upper; wisdom tooth on the lower" (01/16/2018)  . TOTAL ABDOMINAL HYSTERECTOMY  1970's   w/ appendectomy    Vitals:   07/03/19 1413   BP: 130/70  Pulse: 73  SpO2: 94%    Subjective Assessment - 07/03/19 1413    Subjective  Doing well today.    Pertinent History  DM II, stroke 2014, 2015, 2016, PVD, supplemental O2 use, migraine, memory change, HTN, HLD, HA, hiatal hernia, GERD, peripheral neuropathy, depression, CAD, claustrophobia, CHF, L BBB, asthma, anxiety, anemia, L ankle ORIF 05/2018, CABG x5 2011    Diagnostic tests  none recent    Patient Stated Goals  "get to walking again and get control of my fatigue"    Currently in Pain?  Yes    Pain Score  5     Pain Location  Back    Pain Orientation  Lower;Right;Left    Pain Descriptors / Indicators  Aching    Pain Type  Chronic pain    Multiple Pain Sites  No                        [facility] Adult PT Treatment/Exercise - 07/03/19 0001      Transfers   Transfers  Sit to Stand;Stand to Sit    Sit to Stand  5: Supervision    Sit to Stand Details  Tactile cues for sequencing;Visual cues for safe use of DME/AE    Sit to Stand Details (indicate cue type and reason)  cues for proper brakes locked and hand placement with transfer from rollator to mat table     Stand to Sit  5: Supervision    Stand to Sit Details (indicate cue type and reason)  Tactile cues for sequencing    Stand to Sit Details  cues for brakes to be locked       Ambulation/Gait   Ambulation/Gait  Yes    Ambulation/Gait Assistance  5: Supervision    Ambulation/Gait Assistance Details  cues for upright posture in rollator - improved after rollator height adjustment; cued patient for increased stride length which easily translated to improved gait speed - pt. verbalizing she now liked using rollator and felt more stable     Ambulation Distance (Feet)  300 Feet    Assistive device  Rollator    Gait Pattern  Step-through pattern;Decreased step length - right;Decreased step length - left;Trunk flexed    Curb  5: Supervision    Curb Details (indicate cue type and reason)  4" rise on partial  curb to simulate rise in patient's home - cued pt. to lock brakes for improved safety with navigating this rise outdoors     Gait Comments  Navigated up/down ramp outdoors with pt. cued to stay tall in rollator for improved safety      Knee/Hip Exercises: Aerobic   Nustep  Lvl 3, 6 min (UEs/LEs)      Knee/Hip Exercises: Standing   Heel Raises  Both;20 reps   in rollator    Heel Raises Limitations  heel/toe raises    Functional Squat  10 reps;3 seconds    Functional Squat Limitations  at TM rail                PT Short Term Goals - 06/05/19 1403      PT SHORT TERM GOAL #1   Title  Patient to be independent with initial HEP.    Time  3    Period  Weeks    Status  Achieved    Target Date  04/25/19        PT Long Term Goals - 06/05/19 1403      PT LONG TERM GOAL #1   Title  Patient to be independent with advanced HEP.    Time  6    Period  Weeks    Status  Partially Met   recently limited by feeling unwell   Target Date  07/17/19      PT LONG TERM GOAL #2   Title  Patient to demonstrate B LE strength >/=4+/5.    Time  6    Period  Weeks    Status  Partially Met   slight decline in B hip abduction, R hip flexion, L knee flexion, and R ankle DF/PF   Target Date  07/17/19      PT LONG TERM GOAL #3   Title  Patient to score <14 sec on TUG testing in order to decrease risk of falls.  Baseline  38.34 sec with RW    Time  6    Period  Weeks    Status  Partially Met   06/05/19 26.94 sec with RW   Target Date  07/17/19      PT LONG TERM GOAL #4   Title  Patient to score <12 sec on 5xSTS in order to decrease risk of falls.    Baseline  28.29 sec pushing off from B armrests    Time  6    Period  Weeks    Status  Partially Met   06/05/19 21.5 sec pushing off from B knees with last rep requiring push off from armrests   Target Date  07/17/19      PT LONG TERM GOAL #5   Title  Patient to report tolerance for 15 min of walking with LRAD without fatigue limiting.     Time  6    Period  Weeks    Status  On-going   reports limited to 3-4 minutes before fatigue   Target Date  07/17/19            Plan - 07/03/19 1426    Clinical Impression Statement  Keairra doing well today noting much improved mood after she was able to get down in kitchen floor to rearrange kitchen cabinets with husband's assistance.  Does report that her husband is shopping for a rollator for purchase as she liked trial with rollator in last PT session.  Transfer/gait training focusing on rollator safety and brake awareness with transfers along with navigating outside up/down ramps and short rises to simulate small rise in patient's home to check for safe navigation with rollator.  Pt. verbalizing good understanding of rollator safety awareness by end of training today and duration of session focused on LE strengthening to pt. tolerance.  Pt. able to ambulate increased distance with rollator noting reduce UE reliance and decreased shortness of breath.  Pt. able to maintain O2 saturation > 90% today throughout session with minimal sitting rest breaks.    Comorbidities  DM II, stroke 2014, 2015, 2016, PVD, supplemental O2 use, migraine, memory change, HTN, HLD, HA, hiatal hernia, GERD, peripheral neuropathy, depression, CAD, claustrophobia, CHF, L BBB, asthma, anxiety, anemia, L ankle ORIF 05/2018, CABG x5 2011    Rehab Potential  Good    PT Frequency  2x / week    PT Treatment/Interventions  ADLs/Self Care Home Management;Cryotherapy;Electrical Stimulation;Moist Heat;Balance training;Therapeutic exercise;Therapeutic activities;Functional mobility training;Stair training;Gait training;Ultrasound;Neuromuscular re-education;Patient/family education;Manual techniques;Taping;Energy conservation;Dry needling;Passive range of motion    PT Next Visit Plan  LE strengthening; gait training for improved rollator/RW safety    Consulted and Agree with Plan of Care  Patient       Patient will benefit  from skilled therapeutic intervention in order to improve the following deficits and impairments:  Abnormal gait, Decreased endurance, Cardiopulmonary status limiting activity, Decreased activity tolerance, Decreased strength, Pain, Decreased balance, Decreased mobility, Difficulty walking, Improper body mechanics, Decreased range of motion, Impaired flexibility, Postural dysfunction  Visit Diagnosis: Muscle weakness (generalized)  Unsteadiness on feet  Other abnormalities of gait and mobility  Difficulty in walking, not elsewhere classified     Problem List Patient Active Problem List   Diagnosis Date Noted  . Symptomatic bradycardia 01/16/2019  . Normocytic anemia 01/16/2019  . Diabetic peripheral neuropathy ([place]) 01/12/2019  . Neurologic abnormality 12/25/2018  . Debility 09/21/2018  . Hypotension 09/18/2018  . Bradycardia 09/18/2018  . AKI (acute kidney injury) ([place]) 09/18/2018  . Pressure  injury of skin 09/18/2018  . Ischemic cerebrovascular accident (CVA) of frontal lobe ([place]) 09/13/2018  . Fall 09/09/2018  . History of CVA (cerebrovascular accident) 09/09/2018  . Type II diabetes mellitus with renal manifestations ([place]) 09/09/2018  . CKD (chronic kidney disease), stage III 09/09/2018  . Sepsis ([place]) 09/09/2018  . Depression with anxiety 09/09/2018  . Slurred speech 09/09/2018  . Ankle fracture, left 05/16/2018  . Amiodarone pulmonary toxicity   . Physical deconditioning   . 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 Acute on chronic congestive heart failure ([place])   . Chronic combined systolic (congestive) and diastolic (congestive) heart failure ([place]) 01/16/2018  . Chronic systolic CHF (congestive heart failure) ([place]) 01/16/2018  . Atrial fibrillation ([place]) 10/21/2017  . CAD (coronary artery disease) of bypass graft 10/20/2017  . Coronary artery disease of bypass graft of native heart with stable angina pectoris ([place])   . Chronic low back pain 08/24/2016  . Pain of left thumb  08/05/2016  . Nocturnal hypoxia 06/02/2016  . Hoarseness 04/05/2016  . Laryngopharyngeal reflux (LPR) 04/05/2016  . Pharyngoesophageal dysphagia 04/05/2016  . Angina decubitus ([place]) 05/22/2015  . Chronic insomnia 05/06/2015  . Common migraine 05/14/2014  . Abnormality of gait 05/14/2014  . Memory change 05/14/2014  . Aneurysm, cerebral, nonruptured 05/14/2014  . Cramp of limb-Left neck 05/10/2014  . Hematemesis with nausea   . Vomiting blood   . Dizziness and giddiness 09/21/2013  . Atypical chest pain 07/18/2013  . Unstable angina ([place]) 04/09/2013  . Carotid artery stenosis, symptomatic 03/20/2013  . Cerebrovascular disease 07/10/2012  . Cerebral artery occlusion with cerebral infarction ([place]) 07/10/2012  . Mitral regurgitation 04/15/2012  . TIA (transient ischemic attack) 04/15/2012  . Occlusion and stenosis of carotid artery without mention of cerebral infarction 08/18/2011  . Bariatric surgery status 06/17/2011  . Speech abnormality 03/22/2011  . Dyspnea 02/24/2011  . PAPILLARY MUSCLE DYSFUNCTION, NON-RHEUMATIC 10/09/2008  . UNSPECIFIED VITAMIN D DEFICIENCY 10/24/2007  . MYOCARDIAL INFARCTION, HX OF 10/24/2007  . PERSISTENT VOMITING 10/24/2007  . OSTEOARTHRITIS 10/24/2007  . MIGRAINES, HX OF 10/24/2007  . Diabetes mellitus type 2, controlled, with complications ([place]) 04/59/9774  . Hyperlipemia 01/16/2007  . Obesity-post failed open gastroplasty 1984  01/16/2007  . OBSTRUCTIVE SLEEP APNEA 01/16/2007  . Essential hypertension 01/16/2007  . Coronary atherosclerosis 01/16/2007  . Asthma 01/16/2007  . GERD 01/16/2007  . VENTRAL HERNIA 01/16/2007    Bess Harvest, PTA 07/03/19 5:52 PM   [facility] [address]  [place], [place], [address] Phone: [phone]   Fax:  [phone]  Name: YULIETH CARRENDER [name]: [id number] Date of Birth: 1945/11/10

## 2019-07-04 ENCOUNTER — Other Ambulatory Visit (HOSPITAL_COMMUNITY): Payer: Self-pay | Admitting: Cardiology

## 2019-07-05 ENCOUNTER — Ambulatory Visit: Payer: Medicare Other | Admitting: Physical Therapy

## 2019-07-05 ENCOUNTER — Other Ambulatory Visit: Payer: Self-pay

## 2019-07-05 ENCOUNTER — Encounter: Payer: Self-pay | Admitting: Physical Therapy

## 2019-07-05 VITALS — BP 144/70 | HR 74

## 2019-07-05 DIAGNOSIS — M6281 Muscle weakness (generalized): Secondary | ICD-10-CM | POA: Diagnosis not present

## 2019-07-05 DIAGNOSIS — R2689 Other abnormalities of gait and mobility: Secondary | ICD-10-CM

## 2019-07-05 DIAGNOSIS — R2681 Unsteadiness on feet: Secondary | ICD-10-CM

## 2019-07-05 DIAGNOSIS — R262 Difficulty in walking, not elsewhere classified: Secondary | ICD-10-CM

## 2019-07-05 NOTE — Therapy (Addendum)
[facility] [address]  [place], [place], [address] Phone: [phone]   Fax:  [phone]  Physical Therapy Progress Note  Patient Details  Name: [name] [name] [name]: [id number] Date of Birth: 1945/08/27 Referring Provider (PT): Loralie Champagne, MD   Progress Note Reporting Period 05/10/19 to 07/05/19  See note below for Objective Data and Assessment of Progress/Goals.     Encounter Date: 07/05/2019  PT End of Session - 07/05/19 1805    Visit Number  19    Number of Visits  28    Date for PT Re-Evaluation  08/16/19    Authorization Type  Medicare & AARP    PT Start Time  1400    PT Stop Time  1454    PT Time Calculation (min)  54 min    Activity Tolerance  Patient tolerated treatment well    Behavior During Therapy  WFL for tasks assessed/performed       Past Medical History:  Diagnosis Date  . Anemia    hx  . Anxiety   . Asthma   . Basal cell carcinoma 05/2014   "left shoulder"  . Bundle branch block, left    chronic/notes 07/18/2013  . CHF (congestive heart failure) ([place])   . Chronic insomnia 05/06/2015  . Chronic kidney disease    frequency, sees dr Jamal Maes every 4 to 6 months (01/16/2018)  . Chronic lower back pain   . Claustrophobia   . Common migraine 05/14/2014  . Coronary artery disease    MI in 2001, 2002, 2006, 2011, 2014  . Depression   . Diabetic peripheral neuropathy ([place]) 01/12/2019  . GERD (gastroesophageal reflux disease)   . H/O hiatal hernia   . Headache    "at least 2/month" (01/16/2018)  . Heart murmur   . Hyperlipidemia   . Hypertension   . Memory change 05/14/2014  . Migraine    "5-6/year"  (01/16/2018)  . Obesity 01-2010  . Obstructive sleep apnea    "can't wear machine; I have claustrophobia" (01/16/2018), states she had a 2nd sleep study and does not have sleep apnea, her O2 decreases and now is on 2 L of O2 at night.  . On home oxygen therapy    "2L at night  and prn during daytime" (01/16/2018)  . Osteoarthritis    "knees and hands" (01/16/2018)  . Peripheral vascular disease (HCC)    ? numbness, tingling arms and legs  . PONV (postoperative nausea and vomiting)   . Stroke [facility])    2014, 2015, 2016  . Type II diabetes mellitus ([place])   . Ventral hernia    hx of    Past Surgical History:  Procedure Laterality Date  . ANKLE FRACTURE SURGERY Left 1970's  . APPENDECTOMY  1970's   w/hysterectomy  . BASAL CELL CARCINOMA EXCISION Left 05/2014   "shoulder" (01/16/2018)  . CARDIAC CATHETERIZATION  10/10/2012   Dr [name].  Marland Kitchen CARDIAC CATHETERIZATION N/A 05/29/2015   Procedure: Right/Left Heart Cath and Coronary/Graft Angiography;  Surgeon: Larey Dresser, MD;  Location: [facility] CV LAB;  Service: Cardiovascular;  Laterality: N/A;  . CAROTID ENDARTERECTOMY Left 03/2013  . CAROTID STENT INSERTION Left 03/20/2013   Procedure: CAROTID STENT INSERTION;  Surgeon: [name], MD;  Location: [facility] CATH LAB;  Service: Cardiovascular;  Laterality: Left;  internal carotid  . CEREBRAL ANGIOGRAM N/A 04/05/2011   Procedure: CEREBRAL ANGIOGRAM;  Surgeon: Angelia Mould, MD;  Location: [facility] CATH  LAB;  Service: Cardiovascular;  Laterality: N/A;  . CHOLECYSTECTOMY OPEN  2004  . CORONARY ANGIOPLASTY WITH STENT PLACEMENT  01,02,05,06,07,08,11; 04/24/2013   "I've probably got ~ 10 stents by now" (04/24/2013)  . CORONARY ANGIOPLASTY WITH STENT PLACEMENT  06/13/2013   "got 4 stents today" (06/13/2013)  . CORONARY ARTERY BYPASS GRAFT  1220/11   "CABG X5"  . CORONARY STENT INTERVENTION N/A 10/20/2017   Procedure: CORONARY STENT INTERVENTION;  Surgeon: Troy Sine, MD;  Location: [facility] CV LAB;  Service: Cardiovascular;  Laterality: N/A;  . ESOPHAGOGASTRODUODENOSCOPY  08/03/2011   Procedure: ESOPHAGOGASTRODUODENOSCOPY (EGD);  Surgeon: Shann Medal, MD;  Location: [facility];  Service: General;  Laterality: N/A;  . ESOPHAGOGASTRODUODENOSCOPY (EGD) WITH  PROPOFOL N/A 03/11/2014   Procedure: ESOPHAGOGASTRODUODENOSCOPY (EGD) WITH PROPOFOL;  Surgeon: Lafayette Dragon, MD;  Location: [facility] ENDOSCOPY;  Service: Endoscopy;  Laterality: N/A;  . FRACTURE SURGERY    . gall stone removal  05/2003  . GASTRIC RESTRICTION SURGERY  1984   "stapeling"  . HERNIA REPAIR  2004   "in my stomach; had OR on it twice", wire mesh on 1 hernia  . LEFT HEART CATH AND CORS/GRAFTS ANGIOGRAPHY N/A 07/09/2016   Procedure: LEFT HEART CATH AND CORS/GRAFTS ANGIOGRAPHY;  Surgeon: Larey Dresser, MD;  Location: [facility] CV LAB;  Service: Cardiovascular;  Laterality: N/A;  . ORIF ANKLE FRACTURE Left 05/16/2018  . ORIF ANKLE FRACTURE Left 05/16/2018   Procedure: OPEN REDUCTION INTERNAL FIXATION (ORIF) Left ankle with possible syndesmosis fixation;  Surgeon: Nicholes Stairs, MD;  Location: [facility];  Service: Orthopedics;  Laterality: Left;  18mn  . OVARY SURGERY  1970's   "tumor removed"  . PERCUTANEOUS CORONARY STENT INTERVENTION (PCI-S) N/A 06/13/2013   Procedure: PERCUTANEOUS CORONARY STENT INTERVENTION (PCI-S);  Surgeon: JJettie Booze MD;  [facility];  Service: Cardiovascular;  Laterality: N/A;  . PERCUTANEOUS STENT INTERVENTION N/A 04/24/2013   Procedure: PERCUTANEOUS STENT INTERVENTION;  Surgeon: JJettie Booze MD;  [facility];  Service: Cardiovascular;  Laterality: N/A;  . RIGHT/LEFT HEART CATH AND CORONARY/GRAFT ANGIOGRAPHY N/A 10/20/2017   Procedure: RIGHT/LEFT HEART CATH AND CORONARY/GRAFT ANGIOGRAPHY;  Surgeon: MLarey Dresser MD;  [facility];  Service: Cardiovascular;  Laterality: N/A;  . ROOT CANAL  10/2000  . TEE WITHOUT CARDIOVERSION N/A 01/18/2019   Procedure: TRANSESOPHAGEAL ECHOCARDIOGRAM (TEE);  Surgeon: MLarey Dresser MD;  [facility];  Service: Cardiovascular;  Laterality: N/A;  . TIBIA FRACTURE SURGERY Right 1970's   rods and pins  . TOOTH EXTRACTION     "1 on the upper; wisdom tooth on the  lower" (01/16/2018)  . TOTAL ABDOMINAL HYSTERECTOMY  1970's   w/ appendectomy    Vitals:   07/05/19 1401  BP: (!) 144/70  Pulse: 74  SpO2: 94%    Subjective Assessment - 07/05/19 1404    Subjective  Got her new rollator which still needs to be adjusted to her height. Still getting used to it. Reporting 50% improvement since her relapse from pneumonia.    Pertinent History  DM II, stroke 2014, 2015, 2016, PVD, supplemental O2 use, migraine, memory change, HTN, HLD, HA, hiatal hernia, GERD, peripheral neuropathy, depression, CAD, claustrophobia, CHF, L BBB, asthma, anxiety, anemia, L ankle ORIF 05/2018, CABG x5 2011    Diagnostic tests  none recent    Patient Stated Goals  "get to walking again and get control of my fatigue"    Currently in Pain?  Yes  Pain Score  5     Pain Location  Back    Pain Orientation  Right;Left;Lower    Pain Descriptors / Indicators  Aching    Pain Type  Chronic pain         [facility] PT Assessment - 07/05/19 0001      Assessment   Medical Diagnosis  Generalized Weakness    Referring Provider (PT)  Loralie Champagne, MD    Onset Date/Surgical Date  03/04/18      Strength   Right Hip Flexion  4+/5    Right Hip ABduction  4+/5    Right Hip ADduction  4+/5    Left Hip Flexion  4+/5    Left Hip ABduction  4+/5    Left Hip ADduction  4+/5    Right Knee Flexion  4+/5    Right Knee Extension  5/5    Left Knee Flexion  4+/5    Left Knee Extension  4+/5    Right Ankle Dorsiflexion  4+/5    Right Ankle Plantar Flexion  4+/5    Left Ankle Dorsiflexion  4+/5    Left Ankle Plantar Flexion  4+/5      Standardized Balance Assessment   Five times sit to stand comments   19.08 sec without UE support      Timed Up and Go Test   Normal TUG (seconds)  --   26.02 sec with 4WW, 21.25 sec with 4FS                   [facility] Adult PT Treatment/Exercise - 07/05/19 0001      Ambulation/Gait   Ambulation/Gait  Yes    Ambulation/Gait Assistance  6:  Modified independent (Device/Increase time)    Ambulation Distance (Feet)  200 Feet    Assistive device  Rollator    Gait Comments  walking around obstacles with 4WW with cueing for hand positioning on walker handles, avoiding pushing to walker too far forward; pt brushing heels together d/t excessive ER of B hips      Neuro Re-ed    Neuro Re-ed Details   romberg stance 30" EO, 30" EC, with EO R/L head turns x10      Lumbar Exercises: Standing   Heel Raises  10 reps    Heel Raises Limitations  B heel/toe raise at counter      Knee/Hip Exercises: Aerobic   Nustep  Lvl 3, 6 min (UEs/LEs)      Knee/Hip Exercises: Standing   Knee Flexion  Right;Left;10 reps    Knee Flexion Limitations  with yellow TB around ankles; at counter top             PT Education - 07/05/19 1803    Education Details  update to HEP; discussion on objective progress and remaining impairments; encouraged patient to slowly increase walking time/distance inside the home with 4WW to increase endurance    Person(s) Educated  Patient    Methods  Explanation;Demonstration;Tactile cues;Verbal cues;Handout    Comprehension  Verbalized understanding;Returned demonstration       PT Short Term Goals - 07/05/19 1411      PT SHORT TERM GOAL #1   Title  Patient to be independent with initial HEP.    Time  3    Period  Weeks    Status  Achieved    Target Date  04/25/19        PT Long Term Goals - 07/05/19 1412  PT LONG TERM GOAL #1   Title  Patient to be independent with advanced HEP.    Time  6    Period  Weeks    Status  Partially Met   met for current   Target Date  08/16/19      PT LONG TERM GOAL #2   Title  Patient to demonstrate B LE strength >/=4+/5.    Time  6    Period  Weeks    Status  Achieved      PT LONG TERM GOAL #3   Title  Patient to score <14 sec on TUG testing in order to decrease risk of falls.    Baseline  38.34 sec with RW    Time  6    Period  Weeks    Status  Partially  Met   07/05/19 21.25 sec with 4WW   Target Date  08/16/19      PT LONG TERM GOAL #4   Title  Patient to score <12 sec on 5xSTS in order to decrease risk of falls.    Baseline  28.29 sec pushing off from B armrests    Time  6    Period  Weeks    Status  Partially Met   07/05/19 19.08 sec with 4WW   Target Date  08/16/19      PT LONG TERM GOAL #5   Title  Patient to report tolerance for 15 min of walking with LRAD without fatigue limiting.    Time  6    Period  Weeks    Status  On-going   reports limited to 5 minutes before fatigue   Target Date  08/16/19            Plan - 07/05/19 1806    Clinical Impression Statement  Patient arrived to session with new rollator, noting that she is still getting used to it and requesting height adjustment. Overall, reports 50% improvement since her relapse from pneumonia. Patient was able to meet strength goal today. Functional activity tolerance still limited however, as patient noting that she is limited to 5 minutes of walking before fatigue. Patient was able to improve her scores on TUG and 5xSTS today. Worked on gait training with patient's [phone] with cueing for hand positioning on walker handles and avoiding pushing walker too far forward. Patient intermittently brushing heels together d/t excessive ER of B hips during ambulation. Overall patient with good stability and safety with use of 4WW. Worked on standing LE strengthening and balance exercises as patient noting that her sitting HEP is less challenging for her now. Updated HEP with exercises that were well-tolerated today. Patient without complaints at end of session. Vitals were monitored and were [facility] throughout session. Would benefit from continued skilled PT services 2x/week for 3 weeks and additional 1x/week for 3 weeks to address remaining goals.    Comorbidities  DM II, stroke 2014, 2015, 2016, PVD, supplemental O2 use, migraine, memory change, HTN, HLD, HA, hiatal hernia, GERD,  peripheral neuropathy, depression, CAD, claustrophobia, CHF, L BBB, asthma, anxiety, anemia, L ankle ORIF 05/2018, CABG x5 2011    Rehab Potential  Good    PT Frequency  Other (comment)   2x/week for 3 weeks and additional 1x/week for 3 weeks   PT Treatment/Interventions  ADLs/Self Care Home Management;Cryotherapy;Electrical Stimulation;Moist Heat;Balance training;Therapeutic exercise;Therapeutic activities;Functional mobility training;Stair training;Gait training;Ultrasound;Neuromuscular re-education;Patient/family education;Manual techniques;Taping;Energy conservation;Dry needling;Passive range of motion    PT Next Visit Plan  standing LE strengthening and static/dynamic  balance at counter top; gait training for improved rollator/RW safety    Consulted and Agree with Plan of Care  Patient       Patient will benefit from skilled therapeutic intervention in order to improve the following deficits and impairments:  Abnormal gait, Decreased endurance, Cardiopulmonary status limiting activity, Decreased activity tolerance, Decreased strength, Pain, Decreased balance, Decreased mobility, Difficulty walking, Improper body mechanics, Decreased range of motion, Impaired flexibility, Postural dysfunction  Visit Diagnosis: Muscle weakness (generalized) - Plan: PT plan of care cert/re-cert  Unsteadiness on feet - Plan: PT plan of care cert/re-cert  Other abnormalities of gait and mobility - Plan: PT plan of care cert/re-cert  Difficulty in walking, not elsewhere classified - Plan: PT plan of care cert/re-cert     Problem List Patient Active Problem List   Diagnosis Date Noted  . Symptomatic bradycardia 01/16/2019  . Normocytic anemia 01/16/2019  . Diabetic peripheral neuropathy ([place]) 01/12/2019  . Neurologic abnormality 12/25/2018  . Debility 09/21/2018  . Hypotension 09/18/2018  . Bradycardia 09/18/2018  . AKI (acute kidney injury) ([place]) 09/18/2018  . Pressure injury of skin 09/18/2018  .  Ischemic cerebrovascular accident (CVA) of frontal lobe ([place]) 09/13/2018  . Fall 09/09/2018  . History of CVA (cerebrovascular accident) 09/09/2018  . Type II diabetes mellitus with renal manifestations ([place]) 09/09/2018  . CKD (chronic kidney disease), stage III 09/09/2018  . Sepsis ([place]) 09/09/2018  . Depression with anxiety 09/09/2018  . Slurred speech 09/09/2018  . Ankle fracture, left 05/16/2018  . Amiodarone pulmonary toxicity   . Physical deconditioning   . 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 Acute on chronic congestive heart failure ([place])   . Chronic combined systolic (congestive) and diastolic (congestive) heart failure ([place]) 01/16/2018  . Chronic systolic CHF (congestive heart failure) ([place]) 01/16/2018  . Atrial fibrillation ([place]) 10/21/2017  . CAD (coronary artery disease) of bypass graft 10/20/2017  . Coronary artery disease of bypass graft of native heart with stable angina pectoris ([place])   . Chronic low back pain 08/24/2016  . Pain of left thumb 08/05/2016  . Nocturnal hypoxia 06/02/2016  . Hoarseness 04/05/2016  . Laryngopharyngeal reflux (LPR) 04/05/2016  . Pharyngoesophageal dysphagia 04/05/2016  . Angina decubitus ([place]) 05/22/2015  . Chronic insomnia 05/06/2015  . Common migraine 05/14/2014  . Abnormality of gait 05/14/2014  . Memory change 05/14/2014  . Aneurysm, cerebral, nonruptured 05/14/2014  . Cramp of limb-Left neck 05/10/2014  . Hematemesis with nausea   . Vomiting blood   . Dizziness and giddiness 09/21/2013  . Atypical chest pain 07/18/2013  . Unstable angina ([place]) 04/09/2013  . Carotid artery stenosis, symptomatic 03/20/2013  . Cerebrovascular disease 07/10/2012  . Cerebral artery occlusion with cerebral infarction ([place]) 07/10/2012  . Mitral regurgitation 04/15/2012  . TIA (transient ischemic attack) 04/15/2012  . Occlusion and stenosis of carotid artery without mention of cerebral infarction 08/18/2011  . Bariatric surgery status 06/17/2011  . Speech  abnormality 03/22/2011  . Dyspnea 02/24/2011  . PAPILLARY MUSCLE DYSFUNCTION, NON-RHEUMATIC 10/09/2008  . UNSPECIFIED VITAMIN D DEFICIENCY 10/24/2007  . MYOCARDIAL INFARCTION, HX OF 10/24/2007  . PERSISTENT VOMITING 10/24/2007  . OSTEOARTHRITIS 10/24/2007  . MIGRAINES, HX OF 10/24/2007  . Diabetes mellitus type 2, controlled, with complications ([place]) 32/35/5732  . Hyperlipemia 01/16/2007  . Obesity-post failed open gastroplasty 1984  01/16/2007  . OBSTRUCTIVE SLEEP APNEA 01/16/2007  . Essential hypertension 01/16/2007  . Coronary atherosclerosis 01/16/2007  . Asthma 01/16/2007  . GERD 01/16/2007  . VENTRAL HERNIA 01/16/2007  Janene Harvey, PT, DPT 07/05/19 6:17 PM   [facility] [address]  [place], [place], [address] Phone: [phone]   Fax:  [phone]  Name: SANDRA BRENTS [name]: [id number] Date of Birth: 1945-07-14

## 2019-07-09 ENCOUNTER — Ambulatory Visit: Payer: Medicare Other

## 2019-07-09 ENCOUNTER — Other Ambulatory Visit: Payer: Self-pay

## 2019-07-09 VITALS — BP 138/60 | HR 83

## 2019-07-09 DIAGNOSIS — R2689 Other abnormalities of gait and mobility: Secondary | ICD-10-CM

## 2019-07-09 DIAGNOSIS — M6281 Muscle weakness (generalized): Secondary | ICD-10-CM | POA: Diagnosis not present

## 2019-07-09 DIAGNOSIS — R262 Difficulty in walking, not elsewhere classified: Secondary | ICD-10-CM

## 2019-07-09 DIAGNOSIS — R2681 Unsteadiness on feet: Secondary | ICD-10-CM

## 2019-07-09 NOTE — Therapy (Signed)
[facility] [address]  [place], [place], [address] Phone: [phone]   Fax:  [phone]  Physical Therapy Treatment  Patient Details  Name: [name] [name] [name]: [id number] Date of Birth: Jun 29, 1945 Referring Provider (PT): Loralie Champagne, MD   Encounter Date: 07/09/2019  PT End of Session - 07/09/19 1418    Visit Number  20    Number of Visits  28    Date for PT Re-Evaluation  08/16/19    Authorization Type  Medicare & AARP    PT Start Time  1400    PT Stop Time  1438    PT Time Calculation (min)  38 min    Activity Tolerance  Patient tolerated treatment well    Behavior During Therapy  [facility] for tasks assessed/performed       Past Medical History:  Diagnosis Date  . Anemia    hx  . Anxiety   . Asthma   . Basal cell carcinoma 05/2014   "left shoulder"  . Bundle branch block, left    chronic/notes 07/18/2013  . CHF (congestive heart failure) ([place])   . Chronic insomnia 05/06/2015  . Chronic kidney disease    frequency, sees dr Jamal Maes every 4 to 6 months (01/16/2018)  . Chronic lower back pain   . Claustrophobia   . Common migraine 05/14/2014  . Coronary artery disease    MI in 2001, 2002, 2006, 2011, 2014  . Depression   . Diabetic peripheral neuropathy ([place]) 01/12/2019  . GERD (gastroesophageal reflux disease)   . H/O hiatal hernia   . Headache    "at least 2/month" (01/16/2018)  . Heart murmur   . Hyperlipidemia   . Hypertension   . Memory change 05/14/2014  . Migraine    "5-6/year"  (01/16/2018)  . Obesity 01-2010  . Obstructive sleep apnea    "can't wear machine; I have claustrophobia" (01/16/2018), states she had a 2nd sleep study and does not have sleep apnea, her O2 decreases and now is on 2 L of O2 at night.  . On home oxygen therapy    "2L at night and prn during daytime" (01/16/2018)  . Osteoarthritis    "knees and hands" (01/16/2018)  . Peripheral vascular disease (HCC)    ?  numbness, tingling arms and legs  . PONV (postoperative nausea and vomiting)   . Stroke [facility])    2014, 2015, 2016  . Type II diabetes mellitus ([place])   . Ventral hernia    hx of    Past Surgical History:  Procedure Laterality Date  . ANKLE FRACTURE SURGERY Left 1970's  . APPENDECTOMY  1970's   w/hysterectomy  . BASAL CELL CARCINOMA EXCISION Left 05/2014   "shoulder" (01/16/2018)  . CARDIAC CATHETERIZATION  10/10/2012   Dr [name].  Marland Kitchen CARDIAC CATHETERIZATION N/A 05/29/2015   Procedure: Right/Left Heart Cath and Coronary/Graft Angiography;  Surgeon: Larey Dresser, MD;  Location: [facility] CV LAB;  Service: Cardiovascular;  Laterality: N/A;  . CAROTID ENDARTERECTOMY Left 03/2013  . CAROTID STENT INSERTION Left 03/20/2013   Procedure: CAROTID STENT INSERTION;  Surgeon: [name], MD;  Location: [facility] CATH LAB;  Service: Cardiovascular;  Laterality: Left;  internal carotid  . CEREBRAL ANGIOGRAM N/A 04/05/2011   Procedure: CEREBRAL ANGIOGRAM;  Surgeon: Angelia Mould, MD;  Location: [facility] CATH LAB;  Service: Cardiovascular;  Laterality: N/A;  . CHOLECYSTECTOMY OPEN  2004  . CORONARY ANGIOPLASTY WITH STENT PLACEMENT  01,02,05,06,07,08,11; 04/24/2013   "  I've probably got ~ 10 stents by now" (04/24/2013)  . CORONARY ANGIOPLASTY WITH STENT PLACEMENT  06/13/2013   "got 4 stents today" (06/13/2013)  . CORONARY ARTERY BYPASS GRAFT  1220/11   "CABG X5"  . CORONARY STENT INTERVENTION N/A 10/20/2017   Procedure: CORONARY STENT INTERVENTION;  Surgeon: Troy Sine, MD;  Location: [facility] CV LAB;  Service: Cardiovascular;  Laterality: N/A;  . ESOPHAGOGASTRODUODENOSCOPY  08/03/2011   Procedure: ESOPHAGOGASTRODUODENOSCOPY (EGD);  Surgeon: Shann Medal, MD;  Location: [facility];  Service: General;  Laterality: N/A;  . ESOPHAGOGASTRODUODENOSCOPY (EGD) WITH PROPOFOL N/A 03/11/2014   Procedure: ESOPHAGOGASTRODUODENOSCOPY (EGD) WITH PROPOFOL;  Surgeon: Lafayette Dragon, MD;  Location: [facility] ENDOSCOPY;   Service: Endoscopy;  Laterality: N/A;  . FRACTURE SURGERY    . gall stone removal  05/2003  . GASTRIC RESTRICTION SURGERY  1984   "stapeling"  . HERNIA REPAIR  2004   "in my stomach; had OR on it twice", wire mesh on 1 hernia  . LEFT HEART CATH AND CORS/GRAFTS ANGIOGRAPHY N/A 07/09/2016   Procedure: LEFT HEART CATH AND CORS/GRAFTS ANGIOGRAPHY;  Surgeon: Larey Dresser, MD;  Location: [place] [facility] CV LAB;  Service: Cardiovascular;  Laterality: N/A;  . ORIF ANKLE FRACTURE Left 05/16/2018  . ORIF ANKLE FRACTURE Left 05/16/2018   Procedure: OPEN REDUCTION INTERNAL FIXATION (ORIF) Left ankle with possible syndesmosis fixation;  Surgeon: Nicholes Stairs, MD;  Location: [place] [facility];  Service: Orthopedics;  Laterality: Left;  139mn  . OVARY SURGERY  1970's   "tumor removed"  . PERCUTANEOUS CORONARY STENT INTERVENTION (PCI-S) N/A 06/13/2013   Procedure: PERCUTANEOUS CORONARY STENT INTERVENTION (PCI-S);  Surgeon: JJettie Booze MD;  [facility];  Service: Cardiovascular;  Laterality: N/A;  . PERCUTANEOUS STENT INTERVENTION N/A 04/24/2013   Procedure: PERCUTANEOUS STENT INTERVENTION;  Surgeon: JJettie Booze MD;  [facility];  Service: Cardiovascular;  Laterality: N/A;  . RIGHT/LEFT HEART CATH AND CORONARY/GRAFT ANGIOGRAPHY N/A 10/20/2017   Procedure: RIGHT/LEFT HEART CATH AND CORONARY/GRAFT ANGIOGRAPHY;  Surgeon: MLarey Dresser MD;  [facility];  Service: Cardiovascular;  Laterality: N/A;  . ROOT CANAL  10/2000  . TEE WITHOUT CARDIOVERSION N/A 01/18/2019   Procedure: TRANSESOPHAGEAL ECHOCARDIOGRAM (TEE);  Surgeon: MLarey Dresser MD;  [facility];  Service: Cardiovascular;  Laterality: N/A;  . TIBIA FRACTURE SURGERY Right 1970's   rods and pins  . TOOTH EXTRACTION     "1 on the upper; wisdom tooth on the lower" (01/16/2018)  . TOTAL ABDOMINAL HYSTERECTOMY  1970's   w/ appendectomy    Vitals:   07/09/19 1403  BP: 138/60  Pulse: 83   SpO2: 94%    Subjective Assessment - 07/09/19 1403    Subjective  Pt. reports she fell out of bed last night onto carpet at 12:30am and had to lay on floor until 6am this morning.  Was only able to get up from floor on her own once she woke up in the morning.  Pt. noting her husband was gone out of state for business nad daughter was not imformed of her fall until later today.    Pertinent History  DM II, stroke 2014, 2015, 2016, PVD, supplemental O2 use, migraine, memory change, HTN, HLD, HA, hiatal hernia, GERD, peripheral neuropathy, depression, CAD, claustrophobia, CHF, L BBB, asthma, anxiety, anemia, L ankle ORIF 05/2018, CABG x5 2011    Diagnostic tests  none recent    Patient Stated Goals  "get to walking again and get  control of my fatigue"    Currently in Pain?  Yes    Pain Score  7     Pain Location  Back    Pain Orientation  Right;Left;Lower    Pain Descriptors / Indicators  Sore    Pain Type  Chronic pain    Pain Score  5    Pain Location  Hip    Pain Orientation  Right;Upper    Pain Descriptors / Indicators  Sore    Pain Type  Acute pain    Aggravating Factors   falling out of bed landing on R hip last night                        [facility] Adult PT Treatment/Exercise - 07/09/19 0001      Self-Care   Self-Care  Other Self-Care Comments    Other Self-Care Comments   Discussion of pt. fall out of bed (rolled out of bed) last night and strategies to prevent this in future       Neuro Re-ed    Neuro Re-ed Details   Romberg stance 30" EO, 30" EC, with EO R/L head turns x 10; tandem walk forwards/backwards at counter x 2 laps; side stepping at counter x 1 lap       Lumbar Exercises: Standing   Heel Raises  15 reps    Heel Raises Limitations  B heel/toe raise at counter    Functional Squats  3 seconds   x 12 reps    Functional Squats Limitations  chair behind at counter       Knee/Hip Exercises: Aerobic   Nustep  Lvl 4, 6 min (UEs/LEs)                PT Short Term Goals - 07/05/19 1411      PT SHORT TERM GOAL #1   Title  Patient to be independent with initial HEP.    Time  3    Period  Weeks    Status  Achieved    Target Date  04/25/19        PT Long Term Goals - 07/05/19 1412      PT LONG TERM GOAL #1   Title  Patient to be independent with advanced HEP.    Time  6    Period  Weeks    Status  Partially Met   met for current   Target Date  08/16/19      PT LONG TERM GOAL #2   Title  Patient to demonstrate B LE strength >/=4+/5.    Time  6    Period  Weeks    Status  Achieved      PT LONG TERM GOAL #3   Title  Patient to score <14 sec on TUG testing in order to decrease risk of falls.    Baseline  38.34 sec with RW    Time  6    Period  Weeks    Status  Partially Met   07/05/19 21.25 sec with 4WW   Target Date  08/16/19      PT LONG TERM GOAL #4   Title  Patient to score <12 sec on 5xSTS in order to decrease risk of falls.    Baseline  28.29 sec pushing off from B armrests    Time  6    Period  Weeks    Status  Partially Met   07/05/19 19.08 sec with 8TL  Target Date  08/16/19      PT LONG TERM GOAL #5   Title  Patient to report tolerance for 15 min of walking with LRAD without fatigue limiting.    Time  6    Period  Weeks    Status  On-going   reports limited to 5 minutes before fatigue   Target Date  08/16/19            Plan - 07/09/19 1422    Clinical Impression Statement  Pt. admitting to falling out of her bed last night.  She landed on carpeted floor in bedroom after turning over in the night and was unable to get up as she was alone, and husband was out of town on business.  Pt. reports sleeping rest of night on floor beside her bed until 6am when she had the strength to use chair to stand up.  Pt. with complaint of R hip bruising however no other complaints and denies hitting her head during fall.  Pt. vitals remained WFL to start session and throughout therex today thus  proceeded with gentle LE strengthening and balance activities per pt. tolerance.  Ended session with complaint of baseline R hip soreness however no increased pain.  Discussed strategies to prevent further falls in future with pt. verbalizing understanding.    Comorbidities  DM II, stroke 2014, 2015, 2016, PVD, supplemental O2 use, migraine, memory change, HTN, HLD, HA, hiatal hernia, GERD, peripheral neuropathy, depression, CAD, claustrophobia, CHF, L BBB, asthma, anxiety, anemia, L ankle ORIF 05/2018, CABG x5 2011    Rehab Potential  Good    PT Treatment/Interventions  ADLs/Self Care Home Management;Cryotherapy;Electrical Stimulation;Moist Heat;Balance training;Therapeutic exercise;Therapeutic activities;Functional mobility training;Stair training;Gait training;Ultrasound;Neuromuscular re-education;Patient/family education;Manual techniques;Taping;Energy conservation;Dry needling;Passive range of motion    PT Next Visit Plan  standing LE strengthening and static/dynamic balance at counter top; gait training for improved rollator/RW safety    Consulted and Agree with Plan of Care  Patient       Patient will benefit from skilled therapeutic intervention in order to improve the following deficits and impairments:  Abnormal gait, Decreased endurance, Cardiopulmonary status limiting activity, Decreased activity tolerance, Decreased strength, Pain, Decreased balance, Decreased mobility, Difficulty walking, Improper body mechanics, Decreased range of motion, Impaired flexibility, Postural dysfunction  Visit Diagnosis: Muscle weakness (generalized)  Unsteadiness on feet  Other abnormalities of gait and mobility  Difficulty in walking, not elsewhere classified     Problem List Patient Active Problem List   Diagnosis Date Noted  . Symptomatic bradycardia 01/16/2019  . Normocytic anemia 01/16/2019  . Diabetic peripheral neuropathy ([place]) 01/12/2019  . Neurologic abnormality 12/25/2018  . Debility  09/21/2018  . Hypotension 09/18/2018  . Bradycardia 09/18/2018  . AKI (acute kidney injury) ([place]) 09/18/2018  . Pressure injury of skin 09/18/2018  . Ischemic cerebrovascular accident (CVA) of frontal lobe ([place]) 09/13/2018  . Fall 09/09/2018  . History of CVA (cerebrovascular accident) 09/09/2018  . Type II diabetes mellitus with renal manifestations ([place]) 09/09/2018  . CKD (chronic kidney disease), stage III 09/09/2018  . Sepsis ([place]) 09/09/2018  . Depression with anxiety 09/09/2018  . Slurred speech 09/09/2018  . Ankle fracture, left 05/16/2018  . Amiodarone pulmonary toxicity   . Physical deconditioning   . 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 Acute on chronic congestive heart failure ([place])   . Chronic combined systolic (congestive) and diastolic (congestive) heart failure ([place]) 01/16/2018  . Chronic systolic CHF (congestive heart failure) ([place]) 01/16/2018  . Atrial fibrillation ([place])  10/21/2017  . CAD (coronary artery disease) of bypass graft 10/20/2017  . Coronary artery disease of bypass graft of native heart with stable angina pectoris ([place])   . Chronic low back pain 08/24/2016  . Pain of left thumb 08/05/2016  . Nocturnal hypoxia 06/02/2016  . Hoarseness 04/05/2016  . Laryngopharyngeal reflux (LPR) 04/05/2016  . Pharyngoesophageal dysphagia 04/05/2016  . Angina decubitus ([place]) 05/22/2015  . Chronic insomnia 05/06/2015  . Common migraine 05/14/2014  . Abnormality of gait 05/14/2014  . Memory change 05/14/2014  . Aneurysm, cerebral, nonruptured 05/14/2014  . Cramp of limb-Left neck 05/10/2014  . Hematemesis with nausea   . Vomiting blood   . Dizziness and giddiness 09/21/2013  . Atypical chest pain 07/18/2013  . Unstable angina ([place]) 04/09/2013  . Carotid artery stenosis, symptomatic 03/20/2013  . Cerebrovascular disease 07/10/2012  . Cerebral artery occlusion with cerebral infarction ([place]) 07/10/2012  . Mitral regurgitation 04/15/2012  . TIA (transient ischemic attack)  04/15/2012  . Occlusion and stenosis of carotid artery without mention of cerebral infarction 08/18/2011  . Bariatric surgery status 06/17/2011  . Speech abnormality 03/22/2011  . Dyspnea 02/24/2011  . PAPILLARY MUSCLE DYSFUNCTION, NON-RHEUMATIC 10/09/2008  . UNSPECIFIED VITAMIN D DEFICIENCY 10/24/2007  . MYOCARDIAL INFARCTION, HX OF 10/24/2007  . PERSISTENT VOMITING 10/24/2007  . OSTEOARTHRITIS 10/24/2007  . MIGRAINES, HX OF 10/24/2007  . Diabetes mellitus type 2, controlled, with complications ([place]) 59/40/9050  . Hyperlipemia 01/16/2007  . Obesity-post failed open gastroplasty 1984  01/16/2007  . OBSTRUCTIVE SLEEP APNEA 01/16/2007  . Essential hypertension 01/16/2007  . Coronary atherosclerosis 01/16/2007  . Asthma 01/16/2007  . GERD 01/16/2007  . VENTRAL HERNIA 01/16/2007    Bess Harvest, PTA 07/09/19 6:23 PM   [facility] [address]  [place], [place], [address] Phone: [phone]   Fax:  [phone]  Name: CHARNEL GILES [name]: [id number] Date of Birth: 1945-06-17

## 2019-07-12 ENCOUNTER — Ambulatory Visit: Payer: Medicare Other

## 2019-07-12 ENCOUNTER — Other Ambulatory Visit: Payer: Self-pay

## 2019-07-12 ENCOUNTER — Other Ambulatory Visit: Payer: Self-pay | Admitting: Family Medicine

## 2019-07-12 ENCOUNTER — Ambulatory Visit
Admission: RE | Admit: 2019-07-12 | Discharge: 2019-07-12 | Disposition: A | Discharge status: Home / Self Care | Payer: Medicare Other | Source: Ambulatory Visit | Attending: Family Medicine | Admitting: Family Medicine

## 2019-07-12 VITALS — BP 158/80

## 2019-07-12 DIAGNOSIS — J189 Pneumonia, unspecified organism: Secondary | ICD-10-CM

## 2019-07-12 DIAGNOSIS — R262 Difficulty in walking, not elsewhere classified: Secondary | ICD-10-CM

## 2019-07-12 DIAGNOSIS — R2689 Other abnormalities of gait and mobility: Secondary | ICD-10-CM

## 2019-07-12 DIAGNOSIS — M6281 Muscle weakness (generalized): Secondary | ICD-10-CM

## 2019-07-12 DIAGNOSIS — I952 Hypotension due to drugs: Secondary | ICD-10-CM

## 2019-07-12 DIAGNOSIS — R2681 Unsteadiness on feet: Secondary | ICD-10-CM

## 2019-07-12 DIAGNOSIS — R5381 Other malaise: Secondary | ICD-10-CM

## 2019-07-12 DIAGNOSIS — I679 Cerebrovascular disease, unspecified: Secondary | ICD-10-CM

## 2019-07-12 NOTE — Therapy (Signed)
[facility] [address]  [place], [place], [address] Phone: [phone]   Fax:  [phone]  Physical Therapy Treatment  Patient Details  Name: [name] [name] [name]: [id number] Date of Birth: [birth year] Referring Provider (PT): Loralie Champagne, MD   Encounter Date: 07/12/2019   PT End of Session - 07/12/19 1404    Visit Number 21    Number of Visits 28    Date for PT Re-Evaluation 08/16/19    Authorization Type Medicare & AARP    PT Start Time 3664    PT Stop Time 1445    PT Time Calculation (min) 43 min    Activity Tolerance Patient tolerated treatment well    Behavior During Therapy [facility] for tasks assessed/performed           Past Medical History:  Diagnosis Date  . Anemia    hx  . Anxiety   . Asthma   . Basal cell carcinoma 05/2014   "left shoulder"  . Bundle branch block, left    chronic/notes 07/18/2013  . CHF (congestive heart failure) ([place])   . Chronic insomnia 05/06/2015  . Chronic kidney disease    frequency, sees dr Jamal Maes every 4 to 6 months (01/16/2018)  . Chronic lower back pain   . Claustrophobia   . Common migraine 05/14/2014  . Coronary artery disease    MI in 2001, 2002, 2006, 2011, 2014  . Depression   . Diabetic peripheral neuropathy ([place]) 01/12/2019  . GERD (gastroesophageal reflux disease)   . H/O hiatal hernia   . Headache    "at least 2/month" (01/16/2018)  . Heart murmur   . Hyperlipidemia   . Hypertension   . Memory change 05/14/2014  . Migraine    "5-6/year"  (01/16/2018)  . Obesity 01-2010  . Obstructive sleep apnea    "can't wear machine; I have claustrophobia" (01/16/2018), states she had a 2nd sleep study and does not have sleep apnea, her O2 decreases and now is on 2 L of O2 at night.  . On home oxygen therapy    "2L at night and prn during daytime" (01/16/2018)  . Osteoarthritis    "knees and hands" (01/16/2018)  . Peripheral vascular disease (HCC)    ?  numbness, tingling arms and legs  . PONV (postoperative nausea and vomiting)   . Stroke [facility])    2014, 2015, 2016  . Type II diabetes mellitus ([place])   . Ventral hernia    hx of    Past Surgical History:  Procedure Laterality Date  . ANKLE FRACTURE SURGERY Left 1970's  . APPENDECTOMY  1970's   w/hysterectomy  . BASAL CELL CARCINOMA EXCISION Left 05/2014   "shoulder" (01/16/2018)  . CARDIAC CATHETERIZATION  10/10/2012   Dr [name].  Marland Kitchen CARDIAC CATHETERIZATION N/A 05/29/2015   Procedure: Right/Left Heart Cath and Coronary/Graft Angiography;  Surgeon: Larey Dresser, MD;  Location: [place] CV LAB;  Service: Cardiovascular;  Laterality: N/A;  . CAROTID ENDARTERECTOMY Left 03/2013  . CAROTID STENT INSERTION Left 03/20/2013   Procedure: CAROTID STENT INSERTION;  Surgeon: [name], MD;  Location: [facility] CATH LAB;  Service: Cardiovascular;  Laterality: Left;  internal carotid  . CEREBRAL ANGIOGRAM N/A 04/05/2011   Procedure: CEREBRAL ANGIOGRAM;  Surgeon: Angelia Mould, MD;  Location: [facility] CATH LAB;  Service: Cardiovascular;  Laterality: N/A;  . CHOLECYSTECTOMY OPEN  2004  . CORONARY ANGIOPLASTY WITH STENT PLACEMENT  01,02,05,06,07,08,11; 04/24/2013   "I've probably  got ~ 10 stents by now" (04/24/2013)  . CORONARY ANGIOPLASTY WITH STENT PLACEMENT  06/13/2013   "got 4 stents today" (06/13/2013)  . CORONARY ARTERY BYPASS GRAFT  1220/11   "CABG X5"  . CORONARY STENT INTERVENTION N/A 10/20/2017   Procedure: CORONARY STENT INTERVENTION;  Surgeon: Troy Sine, MD;  Location: [place] CV LAB;  Service: Cardiovascular;  Laterality: N/A;  . ESOPHAGOGASTRODUODENOSCOPY  08/03/2011   Procedure: ESOPHAGOGASTRODUODENOSCOPY (EGD);  Surgeon: Shann Medal, MD;  Location: [facility];  Service: General;  Laterality: N/A;  . ESOPHAGOGASTRODUODENOSCOPY (EGD) WITH PROPOFOL N/A 03/11/2014   Procedure: ESOPHAGOGASTRODUODENOSCOPY (EGD) WITH PROPOFOL;  Surgeon: Lafayette Dragon, MD;  Location: [facility] ENDOSCOPY;   Service: Endoscopy;  Laterality: N/A;  . FRACTURE SURGERY    . gall stone removal  05/2003  . GASTRIC RESTRICTION SURGERY  1984   "stapeling"  . HERNIA REPAIR  2004   "in my stomach; had OR on it twice", wire mesh on 1 hernia  . LEFT HEART CATH AND CORS/GRAFTS ANGIOGRAPHY N/A 07/09/2016   Procedure: LEFT HEART CATH AND CORS/GRAFTS ANGIOGRAPHY;  Surgeon: Larey Dresser, MD;  Location: [place] CV LAB;  Service: Cardiovascular;  Laterality: N/A;  . ORIF ANKLE FRACTURE Left 05/16/2018  . ORIF ANKLE FRACTURE Left 05/16/2018   Procedure: OPEN REDUCTION INTERNAL FIXATION (ORIF) Left ankle with possible syndesmosis fixation;  Surgeon: Nicholes Stairs, MD;  Location: [facility];  Service: Orthopedics;  Laterality: Left;  115mn  . OVARY SURGERY  1970's   "tumor removed"  . PERCUTANEOUS CORONARY STENT INTERVENTION (PCI-S) N/A 06/13/2013   Procedure: PERCUTANEOUS CORONARY STENT INTERVENTION (PCI-S);  Surgeon: JJettie Booze MD;  [facility];  Service: Cardiovascular;  Laterality: N/A;  . PERCUTANEOUS STENT INTERVENTION N/A 04/24/2013   Procedure: PERCUTANEOUS STENT INTERVENTION;  Surgeon: JJettie Booze MD;  [facility];  Service: Cardiovascular;  Laterality: N/A;  . RIGHT/LEFT HEART CATH AND CORONARY/GRAFT ANGIOGRAPHY N/A 10/20/2017   Procedure: RIGHT/LEFT HEART CATH AND CORONARY/GRAFT ANGIOGRAPHY;  Surgeon: MLarey Dresser MD;  [facility];  Service: Cardiovascular;  Laterality: N/A;  . ROOT CANAL  10/2000  . TEE WITHOUT CARDIOVERSION N/A 01/18/2019   Procedure: TRANSESOPHAGEAL ECHOCARDIOGRAM (TEE);  Surgeon: MLarey Dresser MD;  [facility];  Service: Cardiovascular;  Laterality: N/A;  . TIBIA FRACTURE SURGERY Right 1970's   rods and pins  . TOOTH EXTRACTION     "1 on the upper; wisdom tooth on the lower" (01/16/2018)  . TOTAL ABDOMINAL HYSTERECTOMY  1970's   w/ appendectomy    Vitals:   07/12/19 1405  BP: (!) 158/80      Subjective Assessment - 07/12/19 1405    Subjective Pt reports her daughter has been staying with her since her fall OOB. She hasn't had any instances since then. Her hip is still sore, but feels like a bruise.    Pertinent History DM II, stroke 2014, 2015, 2016, PVD, supplemental O2 use, migraine, memory change, HTN, HLD, HA, hiatal hernia, GERD, peripheral neuropathy, depression, CAD, claustrophobia, CHF, L BBB, asthma, anxiety, anemia, L ankle ORIF 05/2018, CABG x5 2011    Diagnostic tests none recent    Patient Stated Goals "get to walking again and get control of my fatigue"    Currently in Pain? Yes    Pain Score 4     Pain Location Hip    Pain Orientation Right    Pain Descriptors / Indicators Other (Comment)   like a bruise  Pain Type Acute pain                             [facility] Adult PT Treatment/Exercise - 07/12/19 0001      Neuro Re-ed    Neuro Re-ed Details  Romberg stance 30" EO, 30" EC, with EO R/L and up/down head turns x 10; marchingin hallway at rail 1 lap, big side steps at rail in hallway 1 lap      Lumbar Exercises: Standing   Heel Raises 15 reps    Heel Raises Limitations 2 sets B heel/toe raise at counter    Functional Squats 15 reps;3 seconds   1st set 12 reps   Functional Squats Limitations chair behind at counter       Knee/Hip Exercises: Aerobic   Nustep Lvl 4, 6 min (UEs/LEs)                    PT Short Term Goals - 07/05/19 1411      PT SHORT TERM GOAL #1   Title Patient to be independent with initial HEP.    Time 3    Period Weeks    Status Achieved    Target Date 04/25/19             PT Long Term Goals - 07/05/19 1412      PT LONG TERM GOAL #1   Title Patient to be independent with advanced HEP.    Time 6    Period Weeks    Status Partially Met   met for current   Target Date 08/16/19      PT LONG TERM GOAL #2   Title Patient to demonstrate B LE strength >/=4+/5.    Time 6    Period Weeks    Status  Achieved      PT LONG TERM GOAL #3   Title Patient to score <14 sec on TUG testing in order to decrease risk of falls.    Baseline 38.34 sec with RW    Time 6    Period Weeks    Status Partially Met   07/05/19 21.25 sec with 4WW   Target Date 08/16/19      PT LONG TERM GOAL #4   Title Patient to score <12 sec on 5xSTS in order to decrease risk of falls.    Baseline 28.29 sec pushing off from B armrests    Time 6    Period Weeks    Status Partially Met   07/05/19 19.08 sec with 4WW   Target Date 08/16/19      PT LONG TERM GOAL #5   Title Patient to report tolerance for 15 min of walking with LRAD without fatigue limiting.    Time 6    Period Weeks    Status On-going   reports limited to 5 minutes before fatigue   Target Date 08/16/19                 Plan - 07/12/19 1404    Clinical Impression Statement Pt presents with continued mild R hip soreness s/p fall OOB prior to last session. Vitals were WNL prior to session and pulse/SPO2 following exercise. She tolerate tx well with mild SHOB during exercise and balance activities. Pt had no overt LOB during static/dynamic balance with gait belt donned throughout with SBA/CGA provided. She was advised in seated TrA activation with erect posture and deep breaths to combat back pain,  as well as not looking down when ambulating due to incr risk of LOB.    Comorbidities DM II, stroke 2014, 2015, 2016, PVD, supplemental O2 use, migraine, memory change, HTN, HLD, HA, hiatal hernia, GERD, peripheral neuropathy, depression, CAD, claustrophobia, CHF, L BBB, asthma, anxiety, anemia, L ankle ORIF 05/2018, CABG x5 2011    Rehab Potential Good    PT Treatment/Interventions ADLs/Self Care Home Management;Cryotherapy;Electrical Stimulation;Moist Heat;Balance training;Therapeutic exercise;Therapeutic activities;Functional mobility training;Stair training;Gait training;Ultrasound;Neuromuscular re-education;Patient/family education;Manual  techniques;Taping;Energy conservation;Dry needling;Passive range of motion    PT Next Visit Plan standing LE strengthening and static/dynamic balance at counter top; gait training for improved rollator/RW safety    Consulted and Agree with Plan of Care Patient           Patient will benefit from skilled therapeutic intervention in order to improve the following deficits and impairments:  Abnormal gait, Decreased endurance, Cardiopulmonary status limiting activity, Decreased activity tolerance, Decreased strength, Pain, Decreased balance, Decreased mobility, Difficulty walking, Improper body mechanics, Decreased range of motion, Impaired flexibility, Postural dysfunction  Visit Diagnosis: Muscle weakness (generalized)  Debility  Unsteadiness on feet  Other abnormalities of gait and mobility  Difficulty in walking, not elsewhere classified  Hypotension due to drugs  Cerebrovascular disease     Problem List Patient Active Problem List   Diagnosis Date Noted  . Symptomatic bradycardia 01/16/2019  . Normocytic anemia 01/16/2019  . Diabetic peripheral neuropathy ([place]) 01/12/2019  . Neurologic abnormality 12/25/2018  . Debility 09/21/2018  . Hypotension 09/18/2018  . Bradycardia 09/18/2018  . AKI (acute kidney injury) ([place]) 09/18/2018  . Pressure injury of skin 09/18/2018  . Ischemic cerebrovascular accident (CVA) of frontal lobe ([place]) 09/13/2018  . Fall 09/09/2018  . History of CVA (cerebrovascular accident) 09/09/2018  . Type II diabetes mellitus with renal manifestations ([place]) 09/09/2018  . CKD (chronic kidney disease), stage III 09/09/2018  . Sepsis ([place]) 09/09/2018  . Depression with anxiety 09/09/2018  . Slurred speech 09/09/2018  . Ankle fracture, left 05/16/2018  . Amiodarone pulmonary toxicity   . Physical deconditioning   . 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 Acute on chronic congestive heart failure ([place])   . Chronic combined systolic (congestive) and diastolic  (congestive) heart failure ([place]) 01/16/2018  . Chronic systolic CHF (congestive heart failure) ([place]) 01/16/2018  . Atrial fibrillation ([place]) 10/21/2017  . CAD (coronary artery disease) of bypass graft 10/20/2017  . Coronary artery disease of bypass graft of native heart with stable angina pectoris ([place])   . Chronic low back pain 08/24/2016  . Pain of left thumb 08/05/2016  . Nocturnal hypoxia 06/02/2016  . Hoarseness 04/05/2016  . Laryngopharyngeal reflux (LPR) 04/05/2016  . Pharyngoesophageal dysphagia 04/05/2016  . Angina decubitus ([place]) 05/22/2015  . Chronic insomnia 05/06/2015  . Common migraine 05/14/2014  . Abnormality of gait 05/14/2014  . Memory change 05/14/2014  . Aneurysm, cerebral, nonruptured 05/14/2014  . Cramp of limb-Left neck 05/10/2014  . Hematemesis with nausea   . Vomiting blood   . Dizziness and giddiness 09/21/2013  . Atypical chest pain 07/18/2013  . Unstable angina ([place]) 04/09/2013  . Carotid artery stenosis, symptomatic 03/20/2013  . Cerebrovascular disease 07/10/2012  . Cerebral artery occlusion with cerebral infarction ([place]) 07/10/2012  . Mitral regurgitation 04/15/2012  . TIA (transient ischemic attack) 04/15/2012  . Occlusion and stenosis of carotid artery without mention of cerebral infarction 08/18/2011  . Bariatric surgery status 06/17/2011  . Speech abnormality 03/22/2011  . Dyspnea 02/24/2011  . PAPILLARY MUSCLE DYSFUNCTION, NON-RHEUMATIC 10/09/2008  .  UNSPECIFIED VITAMIN D DEFICIENCY 10/24/2007  . MYOCARDIAL INFARCTION, HX OF 10/24/2007  . PERSISTENT VOMITING 10/24/2007  . OSTEOARTHRITIS 10/24/2007  . MIGRAINES, HX OF 10/24/2007  . Diabetes mellitus type 2, controlled, with complications ([place]) 36/54/2715  . Hyperlipemia 01/16/2007  . Obesity-post failed open gastroplasty 1984  01/16/2007  . OBSTRUCTIVE SLEEP APNEA 01/16/2007  . Essential hypertension 01/16/2007  . Coronary atherosclerosis 01/16/2007  . Asthma 01/16/2007  . GERD  01/16/2007  . VENTRAL HERNIA 01/16/2007    Izell Kirby, PT, DPT 07/12/2019, 2:52 PM  [facility] [address]  [place], [place], [address] Phone: [phone]   Fax:  [phone]  Name: [name] [name] [name]: [id number] Date of Birth: 14-May-1945

## 2019-07-16 ENCOUNTER — Encounter: Payer: Self-pay | Admitting: Physical Therapy

## 2019-07-16 ENCOUNTER — Ambulatory Visit: Payer: Medicare Other | Admitting: Physical Therapy

## 2019-07-16 ENCOUNTER — Other Ambulatory Visit: Payer: Self-pay

## 2019-07-16 VITALS — BP 142/70 | HR 73

## 2019-07-16 DIAGNOSIS — R2689 Other abnormalities of gait and mobility: Secondary | ICD-10-CM

## 2019-07-16 DIAGNOSIS — M6281 Muscle weakness (generalized): Secondary | ICD-10-CM | POA: Diagnosis not present

## 2019-07-16 DIAGNOSIS — R262 Difficulty in walking, not elsewhere classified: Secondary | ICD-10-CM

## 2019-07-16 DIAGNOSIS — R2681 Unsteadiness on feet: Secondary | ICD-10-CM

## 2019-07-16 NOTE — Therapy (Signed)
[facility] [address]  [place], [place], [address] Phone: [phone]   Fax:  [phone]  Physical Therapy Treatment  Patient Details  Name: [name] [name] [name]: [id number] Date of Birth: 06/17/1945 Referring Provider (PT): Loralie Champagne, MD   Encounter Date: 07/16/2019   PT End of Session - 07/16/19 1535    Visit Number 22    Number of Visits 28    Date for PT Re-Evaluation 08/16/19    Authorization Type Medicare & AARP    PT Start Time 6962    PT Stop Time 1533    PT Time Calculation (min) 47 min    Activity Tolerance Patient tolerated treatment well;Patient limited by pain    Behavior During Therapy [facility] for tasks assessed/performed           Past Medical History:  Diagnosis Date  . Anemia    hx  . Anxiety   . Asthma   . Basal cell carcinoma 05/2014   "left shoulder"  . Bundle branch block, left    chronic/notes 07/18/2013  . CHF (congestive heart failure) ([place])   . Chronic insomnia 05/06/2015  . Chronic kidney disease    frequency, sees dr Jamal Maes every 4 to 6 months (01/16/2018)  . Chronic lower back pain   . Claustrophobia   . Common migraine 05/14/2014  . Coronary artery disease    MI in 2001, 2002, 2006, 2011, 2014  . Depression   . Diabetic peripheral neuropathy ([place]) 01/12/2019  . GERD (gastroesophageal reflux disease)   . H/O hiatal hernia   . Headache    "at least 2/month" (01/16/2018)  . Heart murmur   . Hyperlipidemia   . Hypertension   . Memory change 05/14/2014  . Migraine    "5-6/year"  (01/16/2018)  . Obesity 01-2010  . Obstructive sleep apnea    "can't wear machine; I have claustrophobia" (01/16/2018), states she had a 2nd sleep study and does not have sleep apnea, her O2 decreases and now is on 2 L of O2 at night.  . On home oxygen therapy    "2L at night and prn during daytime" (01/16/2018)  . Osteoarthritis    "knees and hands" (01/16/2018)  . Peripheral  vascular disease (HCC)    ? numbness, tingling arms and legs  . PONV (postoperative nausea and vomiting)   . Stroke [facility])    2014, 2015, 2016  . Type II diabetes mellitus ([place])   . Ventral hernia    hx of    Past Surgical History:  Procedure Laterality Date  . ANKLE FRACTURE SURGERY Left 1970's  . APPENDECTOMY  1970's   w/hysterectomy  . BASAL CELL CARCINOMA EXCISION Left 05/2014   "shoulder" (01/16/2018)  . CARDIAC CATHETERIZATION  10/10/2012   Dr [name].  Marland Kitchen CARDIAC CATHETERIZATION N/A 05/29/2015   Procedure: Right/Left Heart Cath and Coronary/Graft Angiography;  Surgeon: Larey Dresser, MD;  Location: [place] CV LAB;  Service: Cardiovascular;  Laterality: N/A;  . CAROTID ENDARTERECTOMY Left 03/2013  . CAROTID STENT INSERTION Left 03/20/2013   Procedure: CAROTID STENT INSERTION;  Surgeon: [name], MD;  Location: [facility] CATH LAB;  Service: Cardiovascular;  Laterality: Left;  internal carotid  . CEREBRAL ANGIOGRAM N/A 04/05/2011   Procedure: CEREBRAL ANGIOGRAM;  Surgeon: Angelia Mould, MD;  Location: [facility] CATH LAB;  Service: Cardiovascular;  Laterality: N/A;  . CHOLECYSTECTOMY OPEN  2004  . CORONARY ANGIOPLASTY WITH STENT PLACEMENT  01,02,05,06,07,08,11; 04/24/2013   "  I've probably got ~ 10 stents by now" (04/24/2013)  . CORONARY ANGIOPLASTY WITH STENT PLACEMENT  06/13/2013   "got 4 stents today" (06/13/2013)  . CORONARY ARTERY BYPASS GRAFT  1220/11   "CABG X5"  . CORONARY STENT INTERVENTION N/A 10/20/2017   Procedure: CORONARY STENT INTERVENTION;  Surgeon: Troy Sine, MD;  Location: [place] CV LAB;  Service: Cardiovascular;  Laterality: N/A;  . ESOPHAGOGASTRODUODENOSCOPY  08/03/2011   Procedure: ESOPHAGOGASTRODUODENOSCOPY (EGD);  Surgeon: Shann Medal, MD;  Location: [facility];  Service: General;  Laterality: N/A;  . ESOPHAGOGASTRODUODENOSCOPY (EGD) WITH PROPOFOL N/A 03/11/2014   Procedure: ESOPHAGOGASTRODUODENOSCOPY (EGD) WITH PROPOFOL;  Surgeon: Lafayette Dragon, MD;   Location: [facility] ENDOSCOPY;  Service: Endoscopy;  Laterality: N/A;  . FRACTURE SURGERY    . gall stone removal  05/2003  . GASTRIC RESTRICTION SURGERY  1984   "stapeling"  . HERNIA REPAIR  2004   "in my stomach; had OR on it twice", wire mesh on 1 hernia  . LEFT HEART CATH AND CORS/GRAFTS ANGIOGRAPHY N/A 07/09/2016   Procedure: LEFT HEART CATH AND CORS/GRAFTS ANGIOGRAPHY;  Surgeon: Larey Dresser, MD;  Location: [place] CV LAB;  Service: Cardiovascular;  Laterality: N/A;  . ORIF ANKLE FRACTURE Left 05/16/2018  . ORIF ANKLE FRACTURE Left 05/16/2018   Procedure: OPEN REDUCTION INTERNAL FIXATION (ORIF) Left ankle with possible syndesmosis fixation;  Surgeon: Nicholes Stairs, MD;  Location: [place];  Service: Orthopedics;  Laterality: Left;  168mn  . OVARY SURGERY  1970's   "tumor removed"  . PERCUTANEOUS CORONARY STENT INTERVENTION (PCI-S) N/A 06/13/2013   Procedure: PERCUTANEOUS CORONARY STENT INTERVENTION (PCI-S);  Surgeon: JJettie Booze MD;  [facility];  Service: Cardiovascular;  Laterality: N/A;  . PERCUTANEOUS STENT INTERVENTION N/A 04/24/2013   Procedure: PERCUTANEOUS STENT INTERVENTION;  Surgeon: JJettie Booze MD;  [facility];  Service: Cardiovascular;  Laterality: N/A;  . RIGHT/LEFT HEART CATH AND CORONARY/GRAFT ANGIOGRAPHY N/A 10/20/2017   Procedure: RIGHT/LEFT HEART CATH AND CORONARY/GRAFT ANGIOGRAPHY;  Surgeon: MLarey Dresser MD;  [place] [facility];  Service: Cardiovascular;  Laterality: N/A;  . ROOT CANAL  10/2000  . TEE WITHOUT CARDIOVERSION N/A 01/18/2019   Procedure: TRANSESOPHAGEAL ECHOCARDIOGRAM (TEE);  Surgeon: MLarey Dresser MD;  [facility];  Service: Cardiovascular;  Laterality: N/A;  . TIBIA FRACTURE SURGERY Right 1970's   rods and pins  . TOOTH EXTRACTION     "1 on the upper; wisdom tooth on the lower" (01/16/2018)  . TOTAL ABDOMINAL HYSTERECTOMY  1970's   w/ appendectomy    Vitals:   07/16/19 1447  07/16/19 1507  BP: (!) 142/70   Pulse: 66 73  SpO2: 96% 94%     Subjective Assessment - 07/16/19 1450    Subjective Doing well. LB has been "giving me a fit" for the past couple of days.    Pertinent History DM II, stroke 2014, 2015, 2016, PVD, supplemental O2 use, migraine, memory change, HTN, HLD, HA, hiatal hernia, GERD, peripheral neuropathy, depression, CAD, claustrophobia, CHF, L BBB, asthma, anxiety, anemia, L ankle ORIF 05/2018, CABG x5 2011    Diagnostic tests none recent    Patient Stated Goals "get to walking again and get control of my fatigue"    Currently in Pain? Yes    Pain Score 5     Pain Location Back    Pain Orientation Right;Left;Lower    Pain Descriptors / Indicators Dull    Pain Type Chronic pain  [facility] Adult PT Treatment/Exercise - 07/16/19 0001      Neuro Re-ed    Neuro Re-ed Details  lateral stepping over beanbags with B UE support on counter 4x length of counter; romberg + VOR 2x10; 1/2 tandem 30" each foot; full tandem 30" each foot; anterior/posterior and R/L wt shifts on foam with 2 fingers support on counter x10 each; marching on foam x20;       Lumbar Exercises: Stretches   Other Lumbar Stretch Exercise prayer stretch with green pball 5x5" forward, R, L to tolerance      Lumbar Exercises: Seated   Other Seated Lumbar Exercises pelvic tilts x10    cues to perform to comfortable end range     Knee/Hip Exercises: Aerobic   Nustep Lvl 4, 6 min (UEs/LEs)                  PT Education - 07/16/19 1534    Education Details update to HEP- advised to perform HEP with family present    Person(s) Educated Patient    Methods Explanation;Demonstration;Tactile cues;Verbal cues;Handout    Comprehension Verbalized understanding;Returned demonstration            PT Short Term Goals - 07/05/19 1411      PT SHORT TERM GOAL #1   Title Patient to be independent with initial HEP.    Time 3    Period Weeks     Status Achieved    Target Date 04/25/19             PT Long Term Goals - 07/05/19 1412      PT LONG TERM GOAL #1   Title Patient to be independent with advanced HEP.    Time 6    Period Weeks    Status Partially Met   met for current   Target Date 08/16/19      PT LONG TERM GOAL #2   Title Patient to demonstrate B LE strength >/=4+/5.    Time 6    Period Weeks    Status Achieved      PT LONG TERM GOAL #3   Title Patient to score <14 sec on TUG testing in order to decrease risk of falls.    Baseline 38.34 sec with RW    Time 6    Period Weeks    Status Partially Met   07/05/19 21.25 sec with 4WW   Target Date 08/16/19      PT LONG TERM GOAL #4   Title Patient to score <12 sec on 5xSTS in order to decrease risk of falls.    Baseline 28.29 sec pushing off from B armrests    Time 6    Period Weeks    Status Partially Met   07/05/19 19.08 sec with 4WW   Target Date 08/16/19      PT LONG TERM GOAL #5   Title Patient to report tolerance for 15 min of walking with LRAD without fatigue limiting.    Time 6    Period Weeks    Status On-going   reports limited to 5 minutes before fatigue   Target Date 08/16/19                 Plan - 07/16/19 1616    Clinical Impression Statement Patient reporting continued LBP, worse for the past couple of days without known cause. Worked on standing balance exercises with intermittent UE support to maintain balance. Patient with mild difficulty maintaining balance and confidence with  VOR activities. Required heavy UE support with lateral stepping over obstacles. Initiated standing dynamic balance training on compliant surface, with visible hesitancy to perform high amplitude movements. Updated HEP with side stepping at counter top- with specific instruction to perform only when family member is present. Patient reported understanding. Ended session with sitting lumbopelvic stretching for relief of LBP. Patient without complaints at  end of session. Patient tolerated session well today with main limitation being LBP.    Comorbidities DM II, stroke 2014, 2015, 2016, PVD, supplemental O2 use, migraine, memory change, HTN, HLD, HA, hiatal hernia, GERD, peripheral neuropathy, depression, CAD, claustrophobia, CHF, L BBB, asthma, anxiety, anemia, L ankle ORIF 05/2018, CABG x5 2011    Rehab Potential Good    PT Treatment/Interventions ADLs/Self Care Home Management;Cryotherapy;Electrical Stimulation;Moist Heat;Balance training;Therapeutic exercise;Therapeutic activities;Functional mobility training;Stair training;Gait training;Ultrasound;Neuromuscular re-education;Patient/family education;Manual techniques;Taping;Energy conservation;Dry needling;Passive range of motion    PT Next Visit Plan standing LE strengthening and static/dynamic balance at counter top; gait training for improved rollator/RW safety    Consulted and Agree with Plan of Care Patient           Patient will benefit from skilled therapeutic intervention in order to improve the following deficits and impairments:  Abnormal gait, Decreased endurance, Cardiopulmonary status limiting activity, Decreased activity tolerance, Decreased strength, Pain, Decreased balance, Decreased mobility, Difficulty walking, Improper body mechanics, Decreased range of motion, Impaired flexibility, Postural dysfunction  Visit Diagnosis: Muscle weakness (generalized)  Unsteadiness on feet  Other abnormalities of gait and mobility  Difficulty in walking, not elsewhere classified     Problem List Patient Active Problem List   Diagnosis Date Noted  . Symptomatic bradycardia 01/16/2019  . Normocytic anemia 01/16/2019  . Diabetic peripheral neuropathy ([place]) 01/12/2019  . Neurologic abnormality 12/25/2018  . Debility 09/21/2018  . Hypotension 09/18/2018  . Bradycardia 09/18/2018  . AKI (acute kidney injury) ([place]) 09/18/2018  . Pressure injury of skin 09/18/2018  . Ischemic  cerebrovascular accident (CVA) of frontal lobe ([place]) 09/13/2018  . Fall 09/09/2018  . History of CVA (cerebrovascular accident) 09/09/2018  . Type II diabetes mellitus with renal manifestations ([place]) 09/09/2018  . CKD (chronic kidney disease), stage III 09/09/2018  . Sepsis ([place]) 09/09/2018  . Depression with anxiety 09/09/2018  . Slurred speech 09/09/2018  . Ankle fracture, left 05/16/2018  . Amiodarone pulmonary toxicity   . Physical deconditioning   . 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 Acute on chronic congestive heart failure ([place])   . Chronic combined systolic (congestive) and diastolic (congestive) heart failure ([place]) 01/16/2018  . Chronic systolic CHF (congestive heart failure) ([place]) 01/16/2018  . Atrial fibrillation ([place]) 10/21/2017  . CAD (coronary artery disease) of bypass graft 10/20/2017  . Coronary artery disease of bypass graft of native heart with stable angina pectoris ([place])   . Chronic low back pain 08/24/2016  . Pain of left thumb 08/05/2016  . Nocturnal hypoxia 06/02/2016  . Hoarseness 04/05/2016  . Laryngopharyngeal reflux (LPR) 04/05/2016  . Pharyngoesophageal dysphagia 04/05/2016  . Angina decubitus ([place]) 05/22/2015  . Chronic insomnia 05/06/2015  . Common migraine 05/14/2014  . Abnormality of gait 05/14/2014  . Memory change 05/14/2014  . Aneurysm, cerebral, nonruptured 05/14/2014  . Cramp of limb-Left neck 05/10/2014  . Hematemesis with nausea   . Vomiting blood   . Dizziness and giddiness 09/21/2013  . Atypical chest pain 07/18/2013  . Unstable angina ([place]) 04/09/2013  . Carotid artery stenosis, symptomatic 03/20/2013  . Cerebrovascular disease 07/10/2012  . Cerebral artery occlusion with  cerebral infarction ([place]) 07/10/2012  . Mitral regurgitation 04/15/2012  . TIA (transient ischemic attack) 04/15/2012  . Occlusion and stenosis of carotid artery without mention of cerebral infarction 08/18/2011  . Bariatric surgery status 06/17/2011  . Speech  abnormality 03/22/2011  . Dyspnea 02/24/2011  . PAPILLARY MUSCLE DYSFUNCTION, NON-RHEUMATIC 10/09/2008  . UNSPECIFIED VITAMIN D DEFICIENCY 10/24/2007  . MYOCARDIAL INFARCTION, HX OF 10/24/2007  . PERSISTENT VOMITING 10/24/2007  . OSTEOARTHRITIS 10/24/2007  . MIGRAINES, HX OF 10/24/2007  . Diabetes mellitus type 2, controlled, with complications ([place]) 41/75/3010  . Hyperlipemia 01/16/2007  . Obesity-post failed open gastroplasty 1984  01/16/2007  . OBSTRUCTIVE SLEEP APNEA 01/16/2007  . Essential hypertension 01/16/2007  . Coronary atherosclerosis 01/16/2007  . Asthma 01/16/2007  . GERD 01/16/2007  . VENTRAL HERNIA 01/16/2007     Janene Harvey, PT, DPT 07/16/19 4:19 PM   [facility] [address]  [place], [place], [address] Phone: [phone]   Fax:  [phone]  Name: [name] [name] [name]: [id number] Date of Birth: May 14, 1945

## 2019-07-19 ENCOUNTER — Other Ambulatory Visit: Payer: Self-pay

## 2019-07-19 ENCOUNTER — Encounter: Payer: Self-pay | Admitting: Physical Therapy

## 2019-07-19 ENCOUNTER — Ambulatory Visit: Payer: Medicare Other | Admitting: Physical Therapy

## 2019-07-19 VITALS — BP 143/68 | HR 64

## 2019-07-19 DIAGNOSIS — M6281 Muscle weakness (generalized): Secondary | ICD-10-CM | POA: Diagnosis not present

## 2019-07-19 DIAGNOSIS — R2689 Other abnormalities of gait and mobility: Secondary | ICD-10-CM

## 2019-07-19 DIAGNOSIS — R262 Difficulty in walking, not elsewhere classified: Secondary | ICD-10-CM

## 2019-07-19 DIAGNOSIS — R2681 Unsteadiness on feet: Secondary | ICD-10-CM

## 2019-07-19 NOTE — Therapy (Addendum)
[facility] [address]  [place], [place], [address] Phone: [phone]   Fax:  [phone]  Physical Therapy Treatment  Patient Details  Name: [name] [name] [name]: [id number] Date of Birth: 03/17/1945 Referring Provider (PT): Loralie Champagne, MD   Progress Note Reporting Period 07/09/19 to 07/19/19  See note below for Objective Data and Assessment of Progress/Goals.      Encounter Date: 07/19/2019   PT End of Session - 07/19/19 1446    Visit Number 23    Number of Visits 28    Date for PT Re-Evaluation 08/16/19    Authorization Type Medicare & AARP    PT Start Time 1941    PT Stop Time 1440    PT Time Calculation (min) 46 min    Activity Tolerance Patient tolerated treatment well    Behavior During Therapy WFL for tasks assessed/performed           Past Medical History:  Diagnosis Date  . Anemia    hx  . Anxiety   . Asthma   . Basal cell carcinoma 05/2014   "left shoulder"  . Bundle branch block, left    chronic/notes 07/18/2013  . CHF (congestive heart failure) ([place])   . Chronic insomnia 05/06/2015  . Chronic kidney disease    frequency, sees dr Jamal Maes every 4 to 6 months (01/16/2018)  . Chronic lower back pain   . Claustrophobia   . Common migraine 05/14/2014  . Coronary artery disease    MI in 2001, 2002, 2006, 2011, 2014  . Depression   . Diabetic peripheral neuropathy ([place]) 01/12/2019  . GERD (gastroesophageal reflux disease)   . H/O hiatal hernia   . Headache    "at least 2/month" (01/16/2018)  . Heart murmur   . Hyperlipidemia   . Hypertension   . Memory change 05/14/2014  . Migraine    "5-6/year"  (01/16/2018)  . Obesity 01-2010  . Obstructive sleep apnea    "can't wear machine; I have claustrophobia" (01/16/2018), states she had a 2nd sleep study and does not have sleep apnea, her O2 decreases and now is on 2 L of O2 at night.  . On home oxygen therapy    "2L at night  and prn during daytime" (01/16/2018)  . Osteoarthritis    "knees and hands" (01/16/2018)  . Peripheral vascular disease (HCC)    ? numbness, tingling arms and legs  . PONV (postoperative nausea and vomiting)   . Stroke [facility])    2014, 2015, 2016  . Type II diabetes mellitus ([place])   . Ventral hernia    hx of    Past Surgical History:  Procedure Laterality Date  . ANKLE FRACTURE SURGERY Left 1970's  . APPENDECTOMY  1970's   w/hysterectomy  . BASAL CELL CARCINOMA EXCISION Left 05/2014   "shoulder" (01/16/2018)  . CARDIAC CATHETERIZATION  10/10/2012   Dr [name].  Marland Kitchen CARDIAC CATHETERIZATION N/A 05/29/2015   Procedure: Right/Left Heart Cath and Coronary/Graft Angiography;  Surgeon: Larey Dresser, MD;  Location: [place] CV LAB;  Service: Cardiovascular;  Laterality: N/A;  . CAROTID ENDARTERECTOMY Left 03/2013  . CAROTID STENT INSERTION Left 03/20/2013   Procedure: CAROTID STENT INSERTION;  Surgeon: [name], MD;  Location: [facility] CATH LAB;  Service: Cardiovascular;  Laterality: Left;  internal carotid  . CEREBRAL ANGIOGRAM N/A 04/05/2011   Procedure: CEREBRAL ANGIOGRAM;  Surgeon: Angelia Mould, MD;  Location: [facility] CATH LAB;  Service: Cardiovascular;  Laterality: N/A;  . CHOLECYSTECTOMY OPEN  2004  . CORONARY ANGIOPLASTY WITH STENT PLACEMENT  01,02,05,06,07,08,11; 04/24/2013   "I've probably got ~ 10 stents by now" (04/24/2013)  . CORONARY ANGIOPLASTY WITH STENT PLACEMENT  06/13/2013   "got 4 stents today" (06/13/2013)  . CORONARY ARTERY BYPASS GRAFT  1220/11   "CABG X5"  . CORONARY STENT INTERVENTION N/A 10/20/2017   Procedure: CORONARY STENT INTERVENTION;  Surgeon: Troy Sine, MD;  Location: [place] CV LAB;  Service: Cardiovascular;  Laterality: N/A;  . ESOPHAGOGASTRODUODENOSCOPY  08/03/2011   Procedure: ESOPHAGOGASTRODUODENOSCOPY (EGD);  Surgeon: Shann Medal, MD;  Location: [facility];  Service: General;  Laterality: N/A;  . ESOPHAGOGASTRODUODENOSCOPY (EGD) WITH  PROPOFOL N/A 03/11/2014   Procedure: ESOPHAGOGASTRODUODENOSCOPY (EGD) WITH PROPOFOL;  Surgeon: Lafayette Dragon, MD;  Location: [facility] ENDOSCOPY;  Service: Endoscopy;  Laterality: N/A;  . FRACTURE SURGERY    . gall stone removal  05/2003  . GASTRIC RESTRICTION SURGERY  1984   "stapeling"  . HERNIA REPAIR  2004   "in my stomach; had OR on it twice", wire mesh on 1 hernia  . LEFT HEART CATH AND CORS/GRAFTS ANGIOGRAPHY N/A 07/09/2016   Procedure: LEFT HEART CATH AND CORS/GRAFTS ANGIOGRAPHY;  Surgeon: Larey Dresser, MD;  Location: [place] CV LAB;  Service: Cardiovascular;  Laterality: N/A;  . ORIF ANKLE FRACTURE Left 05/16/2018  . ORIF ANKLE FRACTURE Left 05/16/2018   Procedure: OPEN REDUCTION INTERNAL FIXATION (ORIF) Left ankle with possible syndesmosis fixation;  Surgeon: Nicholes Stairs, MD;  Location: [facility];  Service: Orthopedics;  Laterality: Left;  178mn  . OVARY SURGERY  1970's   "tumor removed"  . PERCUTANEOUS CORONARY STENT INTERVENTION (PCI-S) N/A 06/13/2013   Procedure: PERCUTANEOUS CORONARY STENT INTERVENTION (PCI-S);  Surgeon: JJettie Booze MD;  [facility];  Service: Cardiovascular;  Laterality: N/A;  . PERCUTANEOUS STENT INTERVENTION N/A 04/24/2013   Procedure: PERCUTANEOUS STENT INTERVENTION;  Surgeon: JJettie Booze MD;  [facility];  Service: Cardiovascular;  Laterality: N/A;  . RIGHT/LEFT HEART CATH AND CORONARY/GRAFT ANGIOGRAPHY N/A 10/20/2017   Procedure: RIGHT/LEFT HEART CATH AND CORONARY/GRAFT ANGIOGRAPHY;  Surgeon: MLarey Dresser MD;  [facility];  Service: Cardiovascular;  Laterality: N/A;  . ROOT CANAL  10/2000  . TEE WITHOUT CARDIOVERSION N/A 01/18/2019   Procedure: TRANSESOPHAGEAL ECHOCARDIOGRAM (TEE);  Surgeon: MLarey Dresser MD;  [facility];  Service: Cardiovascular;  Laterality: N/A;  . TIBIA FRACTURE SURGERY Right 1970's   rods and pins  . TOOTH EXTRACTION     "1 on the upper; wisdom tooth on the  lower" (01/16/2018)  . TOTAL ABDOMINAL HYSTERECTOMY  1970's   w/ appendectomy    Vitals:   07/19/19 1356  BP: (!) 143/68  Pulse: 64  SpO2: 93%     Subjective Assessment - 07/19/19 1404    Subjective I've had a terrible back ache all week." Has been using her seated ellictical machine for up to 30 minutes. Feels that although she is improving, she would like to conserve her PT visits in case she needs therapy later in the year.    Pertinent History DM II, stroke 2014, 2015, 2016, PVD, supplemental O2 use, migraine, memory change, HTN, HLD, HA, hiatal hernia, GERD, peripheral neuropathy, depression, CAD, claustrophobia, CHF, L BBB, asthma, anxiety, anemia, L ankle ORIF 05/2018, CABG x5 2011    Diagnostic tests none recent    Patient Stated Goals "get to walking again and get control of my fatigue"  Currently in Pain? Yes    Pain Score 6     Pain Location Back    Pain Orientation Right;Left    Pain Descriptors / Indicators Aching;Sharp    Pain Type Chronic pain    Pain Radiating Towards R lateral hip              [facility] PT Assessment - 07/19/19 0001      Standardized Balance Assessment   Five times sit to stand comments  13.27 sec without UEs      Timed Up and Go Test   Normal TUG (seconds) --   16.98 with 4WW, 15.89 with 4WW                        [facility] Adult PT Treatment/Exercise - 07/19/19 0001      Knee/Hip Exercises: Aerobic   Nustep Lvl 4, 6 min (UEs/LEs)           Vestibular Treatment/Exercise - 07/19/19 0001      Vestibular Treatment/Exercise   Vestibular Treatment Provided Gaze    Gaze Exercises X1 Viewing Horizontal;X1 Viewing Vertical      X1 Viewing Horizontal   Foot Position sitting    Reps --   2x10   Comments cues for moderate pace      X1 Viewing Vertical   Foot Position sitting    Reps --   x10   Comments cues for moderate pace                 PT Education - 07/19/19 1446    Education Details update/review to HEP;  edu on continued fitness and energy conservation techniques    Person(s) Educated Patient    Methods Explanation;Demonstration;Tactile cues;Verbal cues;Handout    Comprehension Verbalized understanding;Returned demonstration            PT Short Term Goals - 07/19/19 1410      PT SHORT TERM GOAL #1   Title Patient to be independent with initial HEP.    Time 3    Period Weeks    Status Achieved    Target Date 04/25/19             PT Long Term Goals - 07/19/19 1410      PT LONG TERM GOAL #1   Title Patient to be independent with advanced HEP.    Time 6    Period Weeks    Status Achieved      PT LONG TERM GOAL #2   Title Patient to demonstrate B LE strength >/=4+/5.    Time 6    Period Weeks    Status Achieved      PT LONG TERM GOAL #3   Title Patient to score <14 sec on TUG testing in order to decrease risk of falls.    Baseline 38.34 sec with RW    Time 6    Period Weeks    Status Partially Met   07/19/19 15.89 sec with 4WW     PT LONG TERM GOAL #4   Title Patient to score <12 sec on 5xSTS in order to decrease risk of falls.    Baseline 28.29 sec pushing off from B armrests    Time 6    Period Weeks    Status Partially Met   07/19/19 13.27 sec with 4WW     PT LONG TERM GOAL #5   Title Patient to report tolerance for 15 min of walking with LRAD without  fatigue limiting.    Time 6    Period Weeks    Status On-going   reports 5-8 minutes before onset of fatigue                Plan - 07/19/19 1450    Clinical Impression Statement Patient reporting increase in LBP for the past week. Notes that she has been working on more standing exercises lately and feels that this could be contributing. Patient notes that she feels that she has improved considerably with therapy and would like to wrap up at this time in order to conserve her visits. Patient has met or partially met most goals, with exception of walking tolerance goal. Patient notes that she is able to  tolerate 5-8 minutes of walking before onset of fatigue. Patient has demonstrated good improvement in TUG and 5xSTS scores, now only about 1 seconds short of her goal for each test. HEP was reviewed for max benefit and understanding. Patient also described difficulty focusing with quick head turns, thus worked on sitting VOR exercises with c/o mild dizziness. Updated HEP with VOR exercises in sitting for safety. Counseled patient on continuing a daily fitness program of some sort as well as energy conservation techniques for improved independent at home. Patient reported understanding and without complaints at end of session. Patient has demonstrated good progress with therapy and is on 30 day hold at this time.    Comorbidities DM II, stroke 2014, 2015, 2016, PVD, supplemental O2 use, migraine, memory change, HTN, HLD, HA, hiatal hernia, GERD, peripheral neuropathy, depression, CAD, claustrophobia, CHF, L BBB, asthma, anxiety, anemia, L ankle ORIF 05/2018, CABG x5 2011    Rehab Potential Good    PT Treatment/Interventions ADLs/Self Care Home Management;Cryotherapy;Electrical Stimulation;Moist Heat;Balance training;Therapeutic exercise;Therapeutic activities;Functional mobility training;Stair training;Gait training;Ultrasound;Neuromuscular re-education;Patient/family education;Manual techniques;Taping;Energy conservation;Dry needling;Passive range of motion    PT Next Visit Plan 30 day hold at this time    Consulted and Agree with Plan of Care Patient           Patient will benefit from skilled therapeutic intervention in order to improve the following deficits and impairments:  Abnormal gait, Decreased endurance, Cardiopulmonary status limiting activity, Decreased activity tolerance, Decreased strength, Pain, Decreased balance, Decreased mobility, Difficulty walking, Improper body mechanics, Decreased range of motion, Impaired flexibility, Postural dysfunction  Visit Diagnosis: Muscle weakness  (generalized)  Unsteadiness on feet  Other abnormalities of gait and mobility  Difficulty in walking, not elsewhere classified     Problem List Patient Active Problem List   Diagnosis Date Noted  . Symptomatic bradycardia 01/16/2019  . Normocytic anemia 01/16/2019  . Diabetic peripheral neuropathy ([place]) 01/12/2019  . Neurologic abnormality 12/25/2018  . Debility 09/21/2018  . Hypotension 09/18/2018  . Bradycardia 09/18/2018  . AKI (acute kidney injury) ([place]) 09/18/2018  . Pressure injury of skin 09/18/2018  . Ischemic cerebrovascular accident (CVA) of frontal lobe ([place]) 09/13/2018  . Fall 09/09/2018  . History of CVA (cerebrovascular accident) 09/09/2018  . Type II diabetes mellitus with renal manifestations ([place]) 09/09/2018  . CKD (chronic kidney disease), stage III 09/09/2018  . Sepsis ([place]) 09/09/2018  . Depression with anxiety 09/09/2018  . Slurred speech 09/09/2018  . Ankle fracture, left 05/16/2018  . Amiodarone pulmonary toxicity   . Physical deconditioning   . 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 Acute on chronic congestive heart failure ([place])   . Chronic combined systolic (congestive) and diastolic (congestive) heart failure ([place]) 01/16/2018  . Chronic systolic CHF (congestive heart failure) (  [place]) 01/16/2018  . Atrial fibrillation ([place]) 10/21/2017  . CAD (coronary artery disease) of bypass graft 10/20/2017  . Coronary artery disease of bypass graft of native heart with stable angina pectoris ([place])   . Chronic low back pain 08/24/2016  . Pain of left thumb 08/05/2016  . Nocturnal hypoxia 06/02/2016  . Hoarseness 04/05/2016  . Laryngopharyngeal reflux (LPR) 04/05/2016  . Pharyngoesophageal dysphagia 04/05/2016  . Angina decubitus ([place]) 05/22/2015  . Chronic insomnia 05/06/2015  . Common migraine 05/14/2014  . Abnormality of gait 05/14/2014  . Memory change 05/14/2014  . Aneurysm, cerebral, nonruptured 05/14/2014  . Cramp of limb-Left neck 05/10/2014  .  Hematemesis with nausea   . Vomiting blood   . Dizziness and giddiness 09/21/2013  . Atypical chest pain 07/18/2013  . Unstable angina ([place]) 04/09/2013  . Carotid artery stenosis, symptomatic 03/20/2013  . Cerebrovascular disease 07/10/2012  . Cerebral artery occlusion with cerebral infarction ([place]) 07/10/2012  . Mitral regurgitation 04/15/2012  . TIA (transient ischemic attack) 04/15/2012  . Occlusion and stenosis of carotid artery without mention of cerebral infarction 08/18/2011  . Bariatric surgery status 06/17/2011  . Speech abnormality 03/22/2011  . Dyspnea 02/24/2011  . PAPILLARY MUSCLE DYSFUNCTION, NON-RHEUMATIC 10/09/2008  . UNSPECIFIED VITAMIN D DEFICIENCY 10/24/2007  . MYOCARDIAL INFARCTION, HX OF 10/24/2007  . PERSISTENT VOMITING 10/24/2007  . OSTEOARTHRITIS 10/24/2007  . MIGRAINES, HX OF 10/24/2007  . Diabetes mellitus type 2, controlled, with complications ([place]) 14/64/3142  . Hyperlipemia 01/16/2007  . Obesity-post failed open gastroplasty 1984  01/16/2007  . OBSTRUCTIVE SLEEP APNEA 01/16/2007  . Essential hypertension 01/16/2007  . Coronary atherosclerosis 01/16/2007  . Asthma 01/16/2007  . GERD 01/16/2007  . VENTRAL HERNIA 01/16/2007      Janene Harvey, PT, DPT 07/19/19 6:21 PM    [facility] [address]  [place], [place], [address] Phone: [phone]   Fax:  [phone]  Name: ZAKARIA FROMER [name]: [id number] Date of Birth: 1945-04-28  PHYSICAL THERAPY DISCHARGE SUMMARY  Visits from Start of Care: 23  Current functional level related to goals / functional outcomes: See above clinical impression; patient did not return during 30 day hold period   Remaining deficits: Decreased gait and transfer speed, decreased endurance   Education / Equipment: HEP  Plan: Patient agrees to discharge.  Patient goals were partially met. Patient is being discharged due to being pleased with the  current functional level.  ?????     Janene Harvey, PT, DPT 09/06/19 2:40 PM

## 2019-08-08 ENCOUNTER — Other Ambulatory Visit (HOSPITAL_COMMUNITY): Payer: Self-pay | Admitting: Cardiology

## 2019-08-08 ENCOUNTER — Other Ambulatory Visit: Payer: Self-pay | Admitting: Neurology

## 2019-09-06 ENCOUNTER — Other Ambulatory Visit: Payer: Self-pay | Admitting: Neurology

## 2019-09-11 ENCOUNTER — Ambulatory Visit (HOSPITAL_COMMUNITY)
Admission: RE | Admit: 2019-09-11 | Discharge: 2019-09-11 | Disposition: A | Discharge status: Home / Self Care | Payer: Medicare Other | Source: Ambulatory Visit | Attending: Cardiology | Admitting: Cardiology

## 2019-09-11 ENCOUNTER — Other Ambulatory Visit: Payer: Self-pay

## 2019-09-11 ENCOUNTER — Encounter (HOSPITAL_COMMUNITY): Payer: Self-pay | Admitting: Cardiology

## 2019-09-11 VITALS — BP 140/62 | HR 51 | Wt 225.6 lb

## 2019-09-11 DIAGNOSIS — Z8673 Personal history of transient ischemic attack (TIA), and cerebral infarction without residual deficits: Secondary | ICD-10-CM | POA: Insufficient documentation

## 2019-09-11 DIAGNOSIS — I447 Left bundle-branch block, unspecified: Secondary | ICD-10-CM | POA: Insufficient documentation

## 2019-09-11 DIAGNOSIS — E785 Hyperlipidemia, unspecified: Secondary | ICD-10-CM | POA: Insufficient documentation

## 2019-09-11 DIAGNOSIS — I5022 Chronic systolic (congestive) heart failure: Secondary | ICD-10-CM

## 2019-09-11 DIAGNOSIS — Z79899 Other long term (current) drug therapy: Secondary | ICD-10-CM | POA: Insufficient documentation

## 2019-09-11 DIAGNOSIS — K219 Gastro-esophageal reflux disease without esophagitis: Secondary | ICD-10-CM | POA: Diagnosis not present

## 2019-09-11 DIAGNOSIS — N189 Chronic kidney disease, unspecified: Secondary | ICD-10-CM | POA: Diagnosis not present

## 2019-09-11 DIAGNOSIS — Z833 Family history of diabetes mellitus: Secondary | ICD-10-CM | POA: Insufficient documentation

## 2019-09-11 DIAGNOSIS — I480 Paroxysmal atrial fibrillation: Secondary | ICD-10-CM | POA: Insufficient documentation

## 2019-09-11 DIAGNOSIS — I130 Hypertensive heart and chronic kidney disease with heart failure and stage 1 through stage 4 chronic kidney disease, or unspecified chronic kidney disease: Secondary | ICD-10-CM | POA: Insufficient documentation

## 2019-09-11 DIAGNOSIS — I080 Rheumatic disorders of both mitral and aortic valves: Secondary | ICD-10-CM | POA: Insufficient documentation

## 2019-09-11 DIAGNOSIS — Z6841 Body Mass Index (BMI) 40.0 and over, adult: Secondary | ICD-10-CM | POA: Diagnosis not present

## 2019-09-11 DIAGNOSIS — E1122 Type 2 diabetes mellitus with diabetic chronic kidney disease: Secondary | ICD-10-CM | POA: Insufficient documentation

## 2019-09-11 DIAGNOSIS — R001 Bradycardia, unspecified: Secondary | ICD-10-CM | POA: Insufficient documentation

## 2019-09-11 DIAGNOSIS — Z794 Long term (current) use of insulin: Secondary | ICD-10-CM | POA: Diagnosis not present

## 2019-09-11 DIAGNOSIS — Z888 Allergy status to other drugs, medicaments and biological substances status: Secondary | ICD-10-CM | POA: Insufficient documentation

## 2019-09-11 DIAGNOSIS — Z7951 Long term (current) use of inhaled steroids: Secondary | ICD-10-CM | POA: Diagnosis not present

## 2019-09-11 DIAGNOSIS — Z7901 Long term (current) use of anticoagulants: Secondary | ICD-10-CM | POA: Insufficient documentation

## 2019-09-11 DIAGNOSIS — I2510 Atherosclerotic heart disease of native coronary artery without angina pectoris: Secondary | ICD-10-CM | POA: Diagnosis not present

## 2019-09-11 DIAGNOSIS — G4733 Obstructive sleep apnea (adult) (pediatric): Secondary | ICD-10-CM | POA: Insufficient documentation

## 2019-09-11 DIAGNOSIS — Z85820 Personal history of malignant melanoma of skin: Secondary | ICD-10-CM | POA: Insufficient documentation

## 2019-09-11 DIAGNOSIS — E1143 Type 2 diabetes mellitus with diabetic autonomic (poly)neuropathy: Secondary | ICD-10-CM | POA: Diagnosis not present

## 2019-09-11 DIAGNOSIS — I5042 Chronic combined systolic (congestive) and diastolic (congestive) heart failure: Secondary | ICD-10-CM | POA: Diagnosis present

## 2019-09-11 DIAGNOSIS — I6529 Occlusion and stenosis of unspecified carotid artery: Secondary | ICD-10-CM | POA: Diagnosis not present

## 2019-09-11 DIAGNOSIS — Z7902 Long term (current) use of antithrombotics/antiplatelets: Secondary | ICD-10-CM | POA: Insufficient documentation

## 2019-09-11 DIAGNOSIS — Z881 Allergy status to other antibiotic agents status: Secondary | ICD-10-CM | POA: Diagnosis not present

## 2019-09-11 DIAGNOSIS — F419 Anxiety disorder, unspecified: Secondary | ICD-10-CM | POA: Insufficient documentation

## 2019-09-11 DIAGNOSIS — Z880 Allergy status to penicillin: Secondary | ICD-10-CM | POA: Insufficient documentation

## 2019-09-11 DIAGNOSIS — Z8249 Family history of ischemic heart disease and other diseases of the circulatory system: Secondary | ICD-10-CM | POA: Insufficient documentation

## 2019-09-11 DIAGNOSIS — Z951 Presence of aortocoronary bypass graft: Secondary | ICD-10-CM | POA: Insufficient documentation

## 2019-09-11 DIAGNOSIS — Z955 Presence of coronary angioplasty implant and graft: Secondary | ICD-10-CM | POA: Insufficient documentation

## 2019-09-11 DIAGNOSIS — Z8349 Family history of other endocrine, nutritional and metabolic diseases: Secondary | ICD-10-CM | POA: Insufficient documentation

## 2019-09-11 LAB — CBC
HCT: 39.8 % (ref 36.0–46.0)
Hemoglobin: 12.5 g/dL (ref 12.0–15.0)
MCH: 29.3 pg (ref 26.0–34.0)
MCHC: 31.4 g/dL (ref 30.0–36.0)
MCV: 93.4 fL (ref 80.0–100.0)
Platelets: 184 10*3/uL (ref 150–400)
RBC: 4.26 MIL/uL (ref 3.87–5.11)
RDW: 17.2 % — ABNORMAL HIGH (ref 11.5–15.5)
WBC: 7.9 10*3/uL (ref 4.0–10.5)
nRBC: 0 % (ref 0.0–0.2)

## 2019-09-11 LAB — BASIC METABOLIC PANEL
Anion gap: 12 (ref 5–15)
BUN: 21 mg/dL (ref 8–23)
CO2: 27 mmol/L (ref 22–32)
Calcium: 9.3 mg/dL (ref 8.9–10.3)
Chloride: 99 mmol/L (ref 98–111)
Creatinine, Ser: 1.27 mg/dL — ABNORMAL HIGH (ref 0.44–1.00)
GFR calc Af Amer: 49 mL/min — ABNORMAL LOW (ref >60)
GFR calc non Af Amer: 42 mL/min — ABNORMAL LOW (ref >60)
Glucose, Bld: 104 mg/dL — ABNORMAL HIGH (ref 70–99)
Potassium: 4.1 mmol/L (ref 3.5–5.1)
Sodium: 138 mmol/L (ref 135–145)

## 2019-09-11 LAB — BRAIN NATRIURETIC PEPTIDE: B Natriuretic Peptide: 125.4 pg/mL — ABNORMAL HIGH (ref 0.0–100.0)

## 2019-09-11 MED ORDER — APIXABAN 5 MG PO TABS: 5.0000 mg | ORAL_TABLET | Freq: Two times a day (BID) | ORAL | 3 refills | Status: DC

## 2019-09-11 MED ORDER — TORSEMIDE 20 MG PO TABS: 80.0000 mg | ORAL_TABLET | Freq: Two times a day (BID) | ORAL | 2 refills | Status: DC

## 2019-09-11 NOTE — Patient Instructions (Signed)
Increase Torsemide to 80 mg (4 tabs) Twice daily   Labs done today, your results will be available in MyChart, we will contact you for abnormal readings.  Your physician has requested that you have a carotid duplex. This test is an ultrasound of the carotid arteries in your neck. It looks at blood flow through these arteries that supply the brain with blood. Allow one hour for this exam. There are no restrictions or special instructions.  Your physician recommends that you schedule a follow-up appointment in: 4-6 weeks  If you have any questions or concerns before your next appointment please send [place] a message through [place] or call our office at [phone].    TO LEAVE A MESSAGE FOR THE NURSE SELECT OPTION 2, PLEASE LEAVE A MESSAGE INCLUDING: . YOUR NAME . DATE OF BIRTH . CALL BACK NUMBER . REASON FOR CALL**this is important as we prioritize the call backs  [place] AS LONG AS YOU CALL BEFORE 4:00 PM  At the [facility], you and your health needs are our priority. As part of our continuing mission to provide you with exceptional heart care, we have created designated Provider Care Teams. These Care Teams include your primary Cardiologist (physician) and Advanced Practice Providers (APPs- Physician Assistants and Nurse Practitioners) who all work together to provide you with the care you need, when you need it.   You may see any of the following providers on your designated Care Team at your next follow up: Marland Kitchen Dr Glori Bickers . Dr Loralie Champagne . Darrick Grinder, NP . Lyda Jester, PA . Audry Riles, PharmD   Please be sure to bring in all your medications bottles to every appointment.

## 2019-09-11 NOTE — Progress Notes (Signed)
Date:  09/11/2019   ID:  [name] [name], DOB 01-21-1946, [name] [id number]   Provider location: [facility] Type of Visit: Established patient   PCP:  Derinda Late, MD  Cardiologist:  Dr. [name]  Chief Complaint: f/u for chronic combined systolic and diastolic heart failure   History of Present Illness: [name] [name] is a 74 y.o. female who has a history of CAD s/p CABG, diastolic CHF, and cerebrovascular disease with history of CVA presents for followup of CHF and diastolic CHF. She had CABG x 5 in 12/11.  Prior to the CABG she had multiple PCIs.  She had a CVA in 2/14 that presented as visual loss.  She had a left carotid stent in 2/15.   Left heart cath done in Sept. 2014. This showed patent SVG-D, LIMA-LAD, and sequential SVG-OM branches but SVG-RCA and the native RCA were both totally occluded. It was felt that her increased symptoms coincided with occlusion of SVG-RCA.  She was started on Imdur to see if this would help with the chest pain and dyspnea.  However, she feels like Imdur causes leg cramps and does not think that she can take it.  So ranolazine 500 mg bid started and titrated this up to 1000 mg bid.  This helped some but not markedly.  Therefore, sent to  Dr. Irish Lack to address opening RCA CTO. He was able to do this in 5/15 with 4 overlapping Xience DES.  This led to resolution of exertional chest pain.    [name] was started on Brilinta after PCI.  She became much more short of breath after starting on Brilinta. Per Dr Delano Metz stopped and replaced with Plavix.  Dyspnea improved off Brilinta. Given increasing exertional dyspnea and chest pain,  RHC/LHC was done in 4/17.  This showed stable CAD with no interventional target.  Left and right heart filling pressures were not significantly increased.  Medical management.   She had an MRI/MRA in May 2018 that showed moderately severe stenosis of the supraclinoid segment of the right internal  carotid artery, progressed since the 2016 MR angiogram and severe basilar artery stenosis.   She developed recurrent exertional chest pain and had LHC in 6/18, showing 4/5 grafts patent with patent native RCA (similar to prior cath, no changes).    Echo 1/19 with EF 55-60%, moderate diastolic dysfunction, PASP 50 mmHg, mildly dilated RV, mild AS, mild-moderate MR.   She reported increased dyspnea and chest pain and had RHC/LHC in 9/19.  She had DES to sequential SVG-OM1/PLOM.  While hospitalized, she was noted to have runs of atrial fibrillation.  She was very symptomatic with the atrial fibrillation.  Eliquis and amiodarone were started.   She was admitted 12/16-12/19/19 for cardiomems implant and RHC. She was diuresed with IV lasix and transitioned to torsemide 100 mg BID + metolazone once/week. DC weight: 295 lbs.  She had a prolonged hospitalization in 2/20 with acute respiratory failure and fever to 103.  She was thought to have PNA and treated with cefepime.  She was intubated.  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  Amiodarone was stopped and she was put on prednisone. Echo in 2/20 showed EF down to 25-30% with mid-apical LV severe hypokinesis. She had AKI. Possible ITP triggered by infection, HIT negative.  ITP was treated with Solumedrol. She was discharged to SNF.    ORIF left ankle 4/20.   11/20 admission initially with  concern for TIAs (staring spells), ended up thinking possible partial seizures.  Head MRI did not show CVA.  Echo in 11/20 showed EF 45-50%, moderately decreased RV systolic function, moderate RV enlargement, moderate-severe MR, moderate TR.   Patient was admitted again in 12/20 with AKI and hypotension in the setting of sinus bradycardia (HR 30s).  She was volume overloaded on exam.  Toprol XL was stopped and HR increased to 60s.  She was diuresed with IV Lasix.  TEE in 12/20 showed EF 50-55%, septal-lateral dyssynchrony, mildly  decreased RV function, moderate MR.   Atypical chest pain in 1/21, Cardiolite showed EF 57%, fixed inferior defect (likely artifact), no ischemia.   She presents to clinic today for routine f/u. Feeing more SOB. Started feeling more SOB 4 weeks ago and has progressively worsened over the last 2 weeks. SOB mainly w/ exertion. Improves w/ rest. Also notes bendopnea. No orthopnea/ PND. Denies CP. Wt up 4 lb from her last visit in May, from 221>>225 lb. Reports daily compliance w/ daily Cardiomems transmissions. Unfortunately, unable to access report today from web portal. ReDs Clip elevated at 46%. Reports full compliance w/ meds but notes that UOP w/ torsemide has decreased. Denies dietary indiscretion w/ sodium.   Cardiomems:  Repots full compliance w/ daily transmission. Unable to download report from web portal   ECG (personally reviewed): sinus bradycardia 50 bpm, chronic   Labs (6/14): K 3.8, creatinine 0.71 Labs (8/14): BNP 249, LDL 166 Labs (9/14): K 4.7, creatinine 0.7 Labs (9/14): K 4.6, creatinine 0.9, BNP 109 Labs (1/15): K 4.5, creatinine 0.73, LDL 212, HCT 43.4, TSH normal, BNP 138 Labs (6/15): K 4.7, creatinine 1.17, LDL 100, HDL 37, TGs 363, BNP 39 Labs (10/15): K 4.6, creatinine 1.2 Labs (1/16): LDL 157, HDL 43, K 5.2, creatinine 0.95 Labs (2/16): K 4.5, creatinine 1.08, BNP 113 Labs (4/16): LDL 50, HDL 56, TGs 179 Labs (9/16): K 5.3, creatinine 1.09 Labs (10/16): LDL 75, HDL 56 Labs (2/17): K 5.2, creatinine 1.07 Labs (4/17): K 5.1, creatinine 1.07 Labs (5/17): K 4.5, creatinine 1.01, LDL 96, HDL 56, BNP 70 Labs (03/2016) K 4.9, creatinine 1.07 Labs (7/18): K 4.9, creatinine 1.15 Labs (11/18): K 5.1, creatinine 1.11, LDL 36 Labs (1/19): K 4.9, creatinine 1.22 Labs (9/19): LDL 108 Labs (10/19): hgb 14.5, K 4.3, creatinine 1.03 Labs 12/06/2017: K 4.7 Creatinine 1.7 Labs (3/20): K 4.8, creatinine 1.39, hgb 12.4 plts 226 Labs (11/20): K 4.2, creatinine 1.01, LDL 38 Labs  (12/20): K 3.5, creatinine 1.8 Labs (3/21): K 4.5, creatinine 1.5 Labs (5/21): K 4.2, creatinine 1.10, LDL 57, hgb 11.5   PMH: 1. Diabetic gastroparesis 2. Type II diabetes 3. HTN 4. Morbid obesity 5. CAD: s/p CABG in 12/11 after prior PCIs.  LIMA-LAD, SVG-D, seq SVG-OM1 and OM2, SVG-PDA. Adenosine Cardiolite (8/14) with EF 53% and a small reversible apical defect with a medium, partially reversible inferior defect.  LHC (9/14) with patent SVG-D, LIMA-LAD (50% distal LAD), and sequential SVG-OM branches; the SVG-RCA and the native RCA were occluded.  This was managed medically initially, but with ongoing exertional chest pain, it was decided to open CTO.  Patient had CTO opening with 4 overlapping Xience DES in the RCA in 5/15.   - LHC (4/17): SVG-D patent, LIMA-LAD patent, distal LAD with several 40-50% stenoses, sequential SVG-OM1 and PLOM patent with 50% proximal stenosis (not flow limiting), SVG-RCA TO with patent RCA stents.  - LHC (6/18): 4/5 grafts patent (SVG-RCA TO, same as past), RCA stents  patent => no change.  - LHC (9/19): Sequential SVG-PLOM/OM1 with 80% proximal stenosis, s/p DES.  - Cardiolite (1/21): EF 57%, fixed inferior defect (likely artifact), no ischemia.  6. Atypical migraines 7. OHS/OSA: Intolerant of CPAP. Uses oxygen with exertion and at night.  8. GERD with hiatal hernia 9. OA 10. Chronic systolic CHF: Echo (78/93) with EF 50-55%, grade II diastolic dysfunction, mild-moderate MR.  Echo (8/14) with EF 55-60%, grade II diastolic dysfunction, mildly increased aortic valve gradient (mean 12 mmHg) but valve opens well, mild MR and mild RV dilation.  Echo (9/15) with EF 60-65%, grade II diastolic dysfunction, mild aortic stenosis, mild mitral stenosis, mild to moderate mitral regurgitation, mildly dilated RV with normal systolic function, PA systolic pressure 46 mmHg.  - RHC (4/17): mean RA 9, PA 38/15 mean 27, mean PCWP 9, CI 2.5.  - Echo (5/17): EF 55-60%, mild LVH,  mildly dilated RV with low normal systolic function, moderate TR, PASP 61 mmHg - Echo (1/19): EF 55-60%, moderate diastolic dysfunction, PASP 50 mmHg, mildly dilated RV, mild AS, mild-moderate MR. - RHC (9/19): mean RA 11, PA 34/12, mean PCWP 15, CI 2.85 - Echo (3/20): EF 25-30% with mid-apical severe hypokinesis.  - Echo (11/20): EF 45-50%, moderately dilated RV with moderately decreased systolic function, moderate-severe MR, moderate TR.  - TEE (12/20): EF 50-55%, septal-lateral dyssynchrony, moderate RV dilation/mildly decreased function, peak RV-RA gradient 51 mmHg, moderate MR.  11. CKD 12. Chronic LBBB 13. Anxiety 14. Carotid stenosis: Followed by [facility], >81% LICA stenosis 01/75.  She had left carotid stent in 2/15. Carotid dopplers 8/15 with no significant disease. Carotid dopplers (4/16): < 40% RICA stenosis.  15. Cerebrovascular disease: CVA 2/14 with right posterior cerebral artery territory ischemic infarction. Cerebral angiogram in 6/14 showed 70% right vertebral ostial stenosis, 10-25% LICA stenosis, > 85% proximal left posterior cerebral artery stenosis, posterior communicating artery aneurysm.  Patient has had episodes of transient expressive aphasia.  Carotid dopplers (27/78) showed >24% LICA stenosis. She had a left carotid stent 2/15.  Possible CVA in 7/15. -  MRI/MRA in May 2018 that showed moderately severe stenosis of the supraclinoid segment of the right internal carotid artery, progressed since the 2016 MR angiogram and severe basilar artery stenosis.  16. Positional vertigo (suspected) 17. Palpitations: Holter (6/15) with rare PVCs/PACs.  - Event monitor (2/18): NSR, occasional PVCs 18. Dyspnea with Brilinta. 19. Melanoma: On face, s/p excision.   20. Aortic stenosis: Mild.  21. Holter (8/16) with no significant arrhythmias.  22. Lower extremity arterial dopplers (9/16) were normal.  23. Sleep study (4/18): No significant OSA.  24. Atrial fibrillation: Paroxysmal -  Amiodarone stopped, ?lung toxicity.  25. H/o ITP in 3/20.  16. Partial seizures 17. Mitral regurgitation: Moderate on TEE in 12/20.    Current Outpatient Medications  Medication Sig Dispense Refill  . acetaminophen (TYLENOL) 500 MG tablet Take 500 mg by mouth every 6 (six) hours as needed for mild pain, moderate pain, fever or headache.    . albuterol (VENTOLIN HFA) 108 (90 Base) MCG/ACT inhaler Inhale 2 puffs into the lungs every 4 (four) hours as needed for wheezing or shortness of breath.     . Alirocumab (PRALUENT) 150 MG/ML SOAJ Inject 150 mg/mL into the skin every 14 (fourteen) days.    . ALPRAZolam (XANAX) 0.5 MG tablet Take 1-2 tablets (0.5-1 mg total) by mouth See admin instructions. Take one tablet (0.5 mg) by mouth every morning and two tablets (1 mg) at night, may also  take 1 tablet (0.5 mg) midday as needed for anxiety    . amLODipine (NORVASC) 10 MG tablet Take 1 tablet (10 mg total) by mouth daily. 30 tablet 5  . apixaban (ELIQUIS) 5 MG TABS tablet Take 1 tablet (5 mg total) by mouth 2 (two) times daily. 60 tablet 6  . bisacodyl (DULCOLAX) 5 MG EC tablet Take 5 mg by mouth daily as needed for moderate constipation.    Marland Kitchen buPROPion (WELLBUTRIN XL) 150 MG 24 hr tablet Take 150 mg by mouth every morning.    . clopidogrel (PLAVIX) 75 MG tablet Take 75 mg by mouth at bedtime.    Marland Kitchen denosumab (PROLIA) 60 MG/ML SOSY injection Inject 60 mg into the skin every 6 (six) months.     . DULoxetine (CYMBALTA) 30 MG capsule Take 1 capsule (30 mg total) by mouth daily. 30 capsule 0  . ezetimibe (ZETIA) 10 MG tablet Take 1 tablet (10 mg total) by mouth at bedtime.    . Fluticasone-Salmeterol (ADVAIR) 250-50 MCG/DOSE AEPB Inhale 1 puff into the lungs daily.    Marland Kitchen gabapentin (NEURONTIN) 600 MG tablet Take 600 mg by mouth 4 (four) times daily as needed.    Marland Kitchen HYDROcodone-acetaminophen (NORCO/VICODIN) 5-325 MG tablet Take 1 tablet by mouth at bedtime as needed for moderate pain.    . Insulin Glargine  (LANTUS SOLOSTAR) 100 UNIT/ML Solostar Pen Inject 38-44 Units into the skin See admin instructions. Inject 44 units subcutaneously daily at bedtime, increase to 44 units for CBG >200    . insulin lispro (HUMALOG KWIKPEN) 100 UNIT/ML KwikPen Inject 14-20 Units into the skin See admin instructions. Inject 14 units subcutaneously prior to breakfast and supper; add 4 units for CBG >200     . ipratropium-albuterol (DUONEB) 0.5-2.5 (3) MG/3ML SOLN Take 3 mLs by nebulization 2 (two) times daily as needed (asthma).    . losartan (COZAAR) 25 MG tablet TAKE 1/2 TABLET BY MOUTH DAILY 15 tablet 3  . methocarbamol (ROBAXIN) 500 MG tablet Take 500 mg by mouth 3 (three) times daily as needed.    . Multiple Vitamin (MULTIVITAMIN WITH MINERALS) TABS tablet Take 1 tablet by mouth every morning. Centrum - Women over 109    . Multiple Vitamins-Minerals (OCUVITE EYE HEALTH FORMULA) CAPS Take 1 capsule by mouth every morning.    . nitroGLYCERIN (NITROSTAT) 0.3 MG SL tablet Place 1 tablet (0.3 mg total) under the tongue every 5 (five) minutes x 3 doses as needed for chest pain. 9 tablet 1  . ondansetron (ZOFRAN-ODT) 4 MG disintegrating tablet Take 4 mg by mouth 2 (two) times daily as needed for nausea or vomiting.    . OXYGEN Inhale 2 L into the lungs at bedtime as needed (shortness of breath).     . pantoprazole (PROTONIX) 40 MG tablet Take 1 tablet (40 mg total) by mouth daily. 30 tablet 0  . Polyvinyl Alcohol-Povidone (REFRESH OP) Place 1 drop into both eyes daily as needed (redness/ dry eyes).    . potassium chloride SA (KLOR-CON) 20 MEQ tablet Take 40 mEq by mouth 2 (two) times daily.    . Pyridoxine HCl (VITAMIN B-6 PO) Take 1 tablet by mouth every morning.    . ranolazine (RANEXA) 1000 MG SR tablet Take 1 tablet (1,000 mg total) by mouth 2 (two) times daily. 180 tablet 1  . rosuvastatin (CRESTOR) 40 MG tablet Take 1 tablet (40 mg total) by mouth at bedtime. 30 tablet 0  . spironolactone (ALDACTONE) 25 MG tablet TAKE  1 TABLET BY MOUTH DAILY 30 tablet 6  . torsemide (DEMADEX) 20 MG tablet Take 4 tablets (80 mg) by mouth every morning, take 2 tablets (40 mg) every evening 180 tablet 2  . traZODone (DESYREL) 50 MG tablet TAKE 1 TABLET BY MOUTH AT BEDTIME 90 tablet 0  . triamcinolone cream (KENALOG) 0.1 % Apply 1 application topically daily as needed (crusty spots on arms).    . vitamin B-12 (CYANOCOBALAMIN) 1000 MCG tablet Take 1,000 mcg by mouth every morning.     . zolpidem (AMBIEN) 10 MG tablet Take 0.5 tablets (5 mg total) by mouth at bedtime. 30 tablet 0   No current facility-administered medications for this encounter.    Allergies:   Amoxicillin, Brilinta [ticagrelor], Erythromycin, Flagyl [metronidazole], Penicillins, Isosorbide mononitrate [isosorbide nitrate], Jardiance [empagliflozin], Metformin and related, Tape, and Erythromycin base   Social History:  The patient  reports that she has never smoked. She has never used smokeless tobacco. She reports that she does not drink alcohol and does not use drugs.   Family History:  The patient's family history includes AAA (abdominal aortic aneurysm) in her father and mother; Cancer in her sister; Deep vein thrombosis in her father and mother; Diabetes in her father, paternal aunt, paternal grandmother, paternal uncle, and paternal uncle; Heart attack in her father; Heart attack (age of onset: 60) in her paternal grandfather; Heart disease in her father and paternal uncle; Hyperlipidemia in her father; Hypertension in her father, mother, and sister.   ROS:  Please see the history of present illness.   All other systems are personally reviewed and negative.   Exam:   BP 140/62   Pulse (!) 51   Wt 102.3 kg (225 lb 9.6 oz)   SpO2 96%   BMI 41.26 kg/m    General:  Well appearing, morbidly obese WF. No respiratory difficulty HEENT: normal Neck: supple. JVD elevated ~10 cm. Carotids 2+ bilat; no bruits. No lymphadenopathy or thyromegaly appreciated. Cor:  PMI nondisplaced. Regular rhythm, slow rate. 3/6 MR murmur  Lungs: decreased BS at the bases, no wheezing  Abdomen: obese, soft, nontender, nondistended. No hepatosplenomegaly. No bruits or masses. Good bowel sounds. Extremities: no cyanosis, clubbing, rash, trace- 1+ bilateral LE edema Neuro: alert & oriented x 3, cranial nerves grossly intact. moves all 4 extremities w/o difficulty. Affect pleasant. Has stutter     Recent Labs: 12/23/2018: ALT 17 01/17/2019: TSH 0.982 01/19/2019: Magnesium 1.9 02/19/2019: B Natriuretic Peptide 181.1 06/06/2019: BUN 13; Creatinine, Ser 1.10; Hemoglobin 11.5; Platelets 239; Potassium 4.2; Sodium 138  Personally reviewed   Wt Readings from Last 3 Encounters:  09/11/19 102.3 kg (225 lb 9.6 oz)  06/06/19 100.3 kg (221 lb 3.2 oz)  05/17/19 101.6 kg (224 lb)     ASSESSMENT AND PLAN:  1. CAD: Occluded native RCA and SVG-RCA. Status post opening of CTO RCA with 4 overlapping Xience DES in 5/15. DES to sequential SVG-OM1/PLOM in 9/19.  She has been on Plavix and Eliquis.  She has atypical chest pain.  No ischemia on 1/21 Cardiolite.  - stable w/o CP  - Continuing on Plavix (neuro has wanted her to stay on this for cerebrovascular disease).  No ASA given chronic Eliquis for PAF - She has not tolerated Imdur in the past. Off Toprol XL with bradycardia.  Continue ranolazine 1000 mg bid.  2. Chronic systolic CHF: Echo in 8/08 with EF 55-60%, moderate diastolic dysfunction. Cardiomems placed 01/16/18.  Echo in 3/20 in setting of severe PNA with intubation  showed EF 25-30%, mid-apical LV severe hypokinesis.  Possible stress (Takotsubo-type) cardiomyopathy related to severe medical illness versus progression of CAD.  She has baseline chronic LBBB which could play a role in cardiomyopathy as well (LBBB cardiomyopathy).  Repeat echo in 11/20 with EF up to 45-50%, moderately dysfunctional RV.  TEE in 12/20 with EF 50-55% with septal-lateral dyssynchrony and mildly  dysfunctional RV.   - NYHA class III- IIIb w/ volume overload by exam and by ReDs clip, 46%.  - Increase Torsemide to 80 mg bid and increase KCl to 60 mEq bid - Will follow Cardiomems trend over the next week to assess response to diuretic increase  - Continue spironolactone 25 mg daily.  - Continue losartan 12.5 mg daily. - Check BMP today and again in 7 days   3. Hyperlipidemia: She remains on Zetia, Crestor and Praluent.  - Lipids at goal 5/21 (LDL 57, TG 83, HDL 57, total 131)  - Continue current regimen  4. Hypertension: controlled on current regimen  - check BMP today  5. Cerebrovascular disease: She has history of CVA and had left carotid stent in 2/15. Followed by [facility].  Most recent MRA head showed severe stenosis of supraclinoid RICA and severe basilar artery stenosis.  - Neurology would like her to continue Plavix for this despite concomitant apixaban use.  6. OHS/OSA: Cannot tolerate CPAP.  Uses oxygen with exertion and at night. No change. 7. Atrial fibrillation: Paroxysmal, no recent palpitations. 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  She is in NSR/sinus brady today.  - She has completed course of prednisone for amiodarone lung toxicity.   - Continue apixaban 5 mg bid. Check CBC today  8. Obesity: Body mass index is 41.26 kg/m.  - fairly sedentary lifestyle. Getting outpatient PT   9. Mitral regurgitation: Moderate to severe MR on last echo in 11/20 but moderate on 12/20 TEE.  Not significant enough for Mitraclip.  10. Bradycardia: Now off Toprol XL.   - HR 50 but not symptomatic  11. Cardotid Artery Disease - s/p remote LICA stent - dopplers 10/20 showed patent left ICA stent w/ > 50% ECA stenosis, 1-39% RICA stenosis and > 50% ECA stenosis - repeat doppler study 10/21 - continue statin therapy + Plavix   Followup in 4-6 weeks w/ APP    Signed, Lyda Jester, PA-C  09/11/2019  [place] [address] [phone] (office) [phone] (fax)

## 2019-10-01 ENCOUNTER — Other Ambulatory Visit: Payer: Self-pay

## 2019-10-01 ENCOUNTER — Ambulatory Visit (HOSPITAL_COMMUNITY)
Admission: RE | Admit: 2019-10-01 | Discharge: 2019-10-01 | Disposition: A | Discharge status: Home / Self Care | Payer: Medicare Other | Source: Ambulatory Visit | Attending: Cardiology | Admitting: Cardiology

## 2019-10-01 ENCOUNTER — Other Ambulatory Visit (HOSPITAL_COMMUNITY): Payer: Self-pay | Admitting: Cardiology

## 2019-10-01 DIAGNOSIS — I6522 Occlusion and stenosis of left carotid artery: Secondary | ICD-10-CM

## 2019-10-01 DIAGNOSIS — I6529 Occlusion and stenosis of unspecified carotid artery: Secondary | ICD-10-CM | POA: Diagnosis not present

## 2019-10-06 ENCOUNTER — Other Ambulatory Visit (HOSPITAL_COMMUNITY): Payer: Self-pay | Admitting: Cardiology

## 2019-10-16 ENCOUNTER — Encounter (HOSPITAL_COMMUNITY): Payer: Medicare Other

## 2019-10-22 ENCOUNTER — Other Ambulatory Visit (HOSPITAL_COMMUNITY): Payer: Self-pay

## 2019-10-22 MED ORDER — CLOPIDOGREL BISULFATE 75 MG PO TABS
75.0000 mg | ORAL_TABLET | Freq: Every day | ORAL | 3 refills | Status: DC
Start: 2019-10-22 — End: 2020-03-21

## 2019-11-04 NOTE — Progress Notes (Signed)
Date:  11/05/2019   ID:  [name] [name], DOB 09/20/1945, [name] [id number]   Provider location: [facility] Type of Visit: Established patient   PCP:  Derinda Late, MD  Cardiologist:  Dr. [name]  Chief Complaint: f/u for chronic combined systolic and diastolic heart failure   History of Present Illness: [name] [name] is a 74 y.o. female who has a history of CAD s/p CABG, diastolic CHF, and cerebrovascular disease with history of CVA presents for followup of CHF and diastolic CHF. She had CABG x 5 in 12/11.  Prior to the CABG she had multiple PCIs.  She had a CVA in 2/14 that presented as visual loss.  She had a left carotid stent in 2/15.   Left heart cath done in Sept. 2014. This showed patent SVG-D, LIMA-LAD, and sequential SVG-OM branches but SVG-RCA and the native RCA were both totally occluded. It was felt that her increased symptoms coincided with occlusion of SVG-RCA.  She was started on Imdur to see if this would help with the chest pain and dyspnea.  However, she feels like Imdur causes leg cramps and does not think that she can take it.  So ranolazine 500 mg bid started and titrated this up to 1000 mg bid.  This helped some but not markedly.  Therefore, sent to  Dr. Irish Lack to address opening RCA CTO. He was able to do this in 5/15 with 4 overlapping Xience DES.  This led to resolution of exertional chest pain.    [name] [name] was started on Brilinta after PCI.  She became much more short of breath after starting on Brilinta. Per Dr Delano Metz stopped and replaced with Plavix.  Dyspnea improved off Brilinta. Given increasing exertional dyspnea and chest pain,  RHC/LHC was done in 4/17.  This showed stable CAD with no interventional target.  Left and right heart filling pressures were not significantly increased.  Medical management.   She had an MRI/MRA in May 2018 that showed moderately severe stenosis of the supraclinoid segment of the right internal  carotid artery, progressed since the 2016 MR angiogram and severe basilar artery stenosis.   She developed recurrent exertional chest pain and had LHC in 6/18, showing 4/5 grafts patent with patent native RCA (similar to prior cath, no changes).    Echo 1/19 with EF 55-60%, moderate diastolic dysfunction, PASP 50 mmHg, mildly dilated RV, mild AS, mild-moderate MR.   She reported increased dyspnea and chest pain and had RHC/LHC in 9/19.  She had DES to sequential SVG-OM1/PLOM.  While hospitalized, she was noted to have runs of atrial fibrillation.  She was very symptomatic with the atrial fibrillation.  Eliquis and amiodarone were started.   She was admitted 12/16-12/19/19 for cardiomems implant and RHC. She was diuresed with IV lasix and transitioned to torsemide 100 mg BID + metolazone once/week. DC weight: 295 lbs.  She had a prolonged hospitalization in 2/20 with acute respiratory failure and fever to 103.  She was thought to have PNA and treated with cefepime.  She was intubated.  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  Amiodarone was stopped and she was put on prednisone. Echo in 2/20 showed EF down to 25-30% with mid-apical LV severe hypokinesis. She had AKI. Possible ITP triggered by infection, HIT negative.  ITP was treated with Solumedrol. She was discharged to SNF.    ORIF left ankle 4/20.   11/20 admission initially with  concern for TIAs (staring spells), ended up thinking possible partial seizures.  Head MRI did not show CVA.  Echo in 11/20 showed EF 45-50%, moderately decreased RV systolic function, moderate RV enlargement, moderate-severe MR, moderate TR.   Patient was admitted again in 12/20 with AKI and hypotension in the setting of sinus bradycardia (HR 30s).  She was volume overloaded on exam.  Toprol XL was stopped and HR increased to 60s.  She was diuresed with IV Lasix.  TEE in 12/20 showed EF 50-55%, septal-lateral dyssynchrony, mildly  decreased RV function, moderate MR.   Atypical chest pain in 1/21, Cardiolite showed EF 57%, fixed inferior defect (likely artifact), no ischemia.   Today she returns for HF follow up. Overall feeling fair. Having ongoing problems with her memory and stuttering.  Says her weight went up from 215-->225 pounds. She has been taking extra torsemide everyday and now having dizziness when standing. Using rolling walker in her house. Denies PND/Orthopnea. Appetite ok. No fever or chills. No bleeding issues. Taking all medications.  Cardiomems:  No downloads in > 30 days . Says she is unable to use the equipment. Not working.   ECG (personally reviewed): sinus bradycardia 50 bpm, chronic   Labs (6/14): K 3.8, creatinine 0.71 Labs (8/14): BNP 249, LDL 166 Labs (9/14): K 4.7, creatinine 0.7 Labs (9/14): K 4.6, creatinine 0.9, BNP 109 Labs (1/15): K 4.5, creatinine 0.73, LDL 212, HCT 43.4, TSH normal, BNP 138 Labs (6/15): K 4.7, creatinine 1.17, LDL 100, HDL 37, TGs 363, BNP 39 Labs (10/15): K 4.6, creatinine 1.2 Labs (1/16): LDL 157, HDL 43, K 5.2, creatinine 0.95 Labs (2/16): K 4.5, creatinine 1.08, BNP 113 Labs (4/16): LDL 50, HDL 56, TGs 179 Labs (9/16): K 5.3, creatinine 1.09 Labs (10/16): LDL 75, HDL 56 Labs (2/17): K 5.2, creatinine 1.07 Labs (4/17): K 5.1, creatinine 1.07 Labs (5/17): K 4.5, creatinine 1.01, LDL 96, HDL 56, BNP 70 Labs (03/2016) K 4.9, creatinine 1.07 Labs (7/18): K 4.9, creatinine 1.15 Labs (11/18): K 5.1, creatinine 1.11, LDL 36 Labs (1/19): K 4.9, creatinine 1.22 Labs (9/19): LDL 108 Labs (10/19): hgb 14.5, K 4.3, creatinine 1.03 Labs 12/06/2017: K 4.7 Creatinine 1.7 Labs (3/20): K 4.8, creatinine 1.39, hgb 12.4 plts 226 Labs (11/20): K 4.2, creatinine 1.01, LDL 38 Labs (12/20): K 3.5, creatinine 1.8 Labs (3/21): K 4.5, creatinine 1.5 Labs (5/21): K 4.2, creatinine 1.10, LDL 57, hgb 11.5   PMH: 1. Diabetic gastroparesis 2. Type II diabetes 3. HTN 4. Morbid  obesity 5. CAD: s/p CABG in 12/11 after prior PCIs.  LIMA-LAD, SVG-D, seq SVG-OM1 and OM2, SVG-PDA. Adenosine Cardiolite (8/14) with EF 53% and a small reversible apical defect with a medium, partially reversible inferior defect.  LHC (9/14) with patent SVG-D, LIMA-LAD (50% distal LAD), and sequential SVG-OM branches; the SVG-RCA and the native RCA were occluded.  This was managed medically initially, but with ongoing exertional chest pain, it was decided to open CTO.  Patient had CTO opening with 4 overlapping Xience DES in the RCA in 5/15.   - LHC (4/17): SVG-D patent, LIMA-LAD patent, distal LAD with several 40-50% stenoses, sequential SVG-OM1 and PLOM patent with 50% proximal stenosis (not flow limiting), SVG-RCA TO with patent RCA stents.  - LHC (6/18): 4/5 grafts patent (SVG-RCA TO, same as past), RCA stents patent => no change.  - LHC (9/19): Sequential SVG-PLOM/OM1 with 80% proximal stenosis, s/p DES.  - Cardiolite (1/21): EF 57%, fixed inferior defect (likely artifact), no ischemia.  6.  Atypical migraines 7. OHS/OSA: Intolerant of CPAP. Uses oxygen with exertion and at night.  8. GERD with hiatal hernia 9. OA 10. Chronic systolic CHF: Echo (06/26) with EF 50-55%, grade II diastolic dysfunction, mild-moderate MR.  Echo (8/14) with EF 55-60%, grade II diastolic dysfunction, mildly increased aortic valve gradient (mean 12 mmHg) but valve opens well, mild MR and mild RV dilation.  Echo (9/15) with EF 60-65%, grade II diastolic dysfunction, mild aortic stenosis, mild mitral stenosis, mild to moderate mitral regurgitation, mildly dilated RV with normal systolic function, PA systolic pressure 46 mmHg.  - RHC (4/17): mean RA 9, PA 38/15 mean 27, mean PCWP 9, CI 2.5.  - Echo (5/17): EF 55-60%, mild LVH, mildly dilated RV with low normal systolic function, moderate TR, PASP 61 mmHg - Echo (1/19): EF 55-60%, moderate diastolic dysfunction, PASP 50 mmHg, mildly dilated RV, mild AS, mild-moderate MR. - RHC  (9/19): mean RA 11, PA 34/12, mean PCWP 15, CI 2.85 - Echo (3/20): EF 25-30% with mid-apical severe hypokinesis.  - Echo (11/20): EF 45-50%, moderately dilated RV with moderately decreased systolic function, moderate-severe MR, moderate TR.  - TEE (12/20): EF 50-55%, septal-lateral dyssynchrony, moderate RV dilation/mildly decreased function, peak RV-RA gradient 51 mmHg, moderate MR.  11. CKD 12. Chronic LBBB 13. Anxiety 14. Carotid stenosis: Followed by [facility], >94% LICA stenosis 85/46.  She had left carotid stent in 2/15. Carotid dopplers 8/15 with no significant disease. Carotid dopplers (4/16): < 40% RICA stenosis.  15. Cerebrovascular disease: CVA 2/14 with right posterior cerebral artery territory ischemic infarction. Cerebral angiogram in 6/14 showed 70% right vertebral ostial stenosis, 27-03% LICA stenosis, > 50% proximal left posterior cerebral artery stenosis, posterior communicating artery aneurysm.  Patient has had episodes of transient expressive aphasia.  Carotid dopplers (09/38) showed >18% LICA stenosis. She had a left carotid stent 2/15.  Possible CVA in 7/15. -  MRI/MRA in May 2018 that showed moderately severe stenosis of the supraclinoid segment of the right internal carotid artery, progressed since the 2016 MR angiogram and severe basilar artery stenosis.  16. Positional vertigo (suspected) 17. Palpitations: Holter (6/15) with rare PVCs/PACs.  - Event monitor (2/18): NSR, occasional PVCs 18. Dyspnea with Brilinta. 19. Melanoma: On face, s/p excision.   20. Aortic stenosis: Mild.  21. Holter (8/16) with no significant arrhythmias.  22. Lower extremity arterial dopplers (9/16) were normal.  23. Sleep study (4/18): No significant OSA.  24. Atrial fibrillation: Paroxysmal - Amiodarone stopped, ?lung toxicity.  25. H/o ITP in 3/20.  16. Partial seizures 17. Mitral regurgitation: Moderate on TEE in 12/20.    Current Outpatient Medications  Medication Sig Dispense Refill  .  acetaminophen (TYLENOL) 500 MG tablet Take 500 mg by mouth every 6 (six) hours as needed for mild pain, moderate pain, fever or headache.    . albuterol (VENTOLIN HFA) 108 (90 Base) MCG/ACT inhaler Inhale 2 puffs into the lungs every 4 (four) hours as needed for wheezing or shortness of breath.     . Alirocumab (PRALUENT) 150 MG/ML SOAJ Inject 150 mg/mL into the skin every 14 (fourteen) days.    . ALPRAZolam (XANAX) 0.5 MG tablet Take 1-2 tablets (0.5-1 mg total) by mouth See admin instructions. Take one tablet (0.5 mg) by mouth every morning and two tablets (1 mg) at night, may also take 1 tablet (0.5 mg) midday as needed for anxiety    . amLODipine (NORVASC) 10 MG tablet Take 1 tablet (10 mg total) by mouth daily. 30 tablet 5  .  apixaban (ELIQUIS) 5 MG TABS tablet Take 1 tablet (5 mg total) by mouth 2 (two) times daily. 180 tablet 3  . bisacodyl (DULCOLAX) 5 MG EC tablet Take 5 mg by mouth daily as needed for moderate constipation.    Marland Kitchen buPROPion (WELLBUTRIN XL) 150 MG 24 hr tablet Take 150 mg by mouth every morning.    . cholecalciferol (VITAMIN D3) 25 MCG (1000 UNIT) tablet Take 1,000 Units by mouth daily.    . clopidogrel (PLAVIX) 75 MG tablet Take 1 tablet (75 mg total) by mouth at bedtime. 30 tablet 3  . denosumab (PROLIA) 60 MG/ML SOSY injection Inject 60 mg into the skin every 6 (six) months.     . DULoxetine (CYMBALTA) 30 MG capsule Take 1 capsule (30 mg total) by mouth daily. 30 capsule 0  . ezetimibe (ZETIA) 10 MG tablet Take 1 tablet (10 mg total) by mouth at bedtime.    . Fluticasone-Salmeterol (ADVAIR) 250-50 MCG/DOSE AEPB Inhale 1 puff into the lungs daily.    Marland Kitchen gabapentin (NEURONTIN) 600 MG tablet Take 600 mg by mouth 4 (four) times daily as needed.    Marland Kitchen HYDROcodone-acetaminophen (NORCO/VICODIN) 5-325 MG tablet Take 1 tablet by mouth at bedtime as needed for moderate pain.    . Insulin Glargine (LANTUS SOLOSTAR) 100 UNIT/ML Solostar Pen Inject 38-44 Units into the skin See admin  instructions. Inject 44 units subcutaneously daily at bedtime, increase to 44 units for CBG >200    . insulin lispro (HUMALOG KWIKPEN) 100 UNIT/ML KwikPen Inject 14-20 Units into the skin See admin instructions. Inject 14 units subcutaneously prior to breakfast and supper; add 4 units for CBG >200     . ipratropium-albuterol (DUONEB) 0.5-2.5 (3) MG/3ML SOLN Take 3 mLs by nebulization 2 (two) times daily as needed (asthma).    . losartan (COZAAR) 25 MG tablet TAKE 1/2 TABLET BY MOUTH DAILY 15 tablet 3  . methocarbamol (ROBAXIN) 500 MG tablet Take 500 mg by mouth 3 (three) times daily as needed.    . Multiple Vitamin (MULTIVITAMIN WITH MINERALS) TABS tablet Take 1 tablet by mouth every morning. Centrum - Women over 42    . Multiple Vitamins-Minerals (OCUVITE EYE HEALTH FORMULA) CAPS Take 1 capsule by mouth every morning.    . nitroGLYCERIN (NITROSTAT) 0.3 MG SL tablet Place 1 tablet (0.3 mg total) under the tongue every 5 (five) minutes x 3 doses as needed for chest pain. 9 tablet 1  . ondansetron (ZOFRAN-ODT) 4 MG disintegrating tablet Take 4 mg by mouth 2 (two) times daily as needed for nausea or vomiting.    . OXYGEN Inhale 2 L into the lungs at bedtime as needed (shortness of breath).     . pantoprazole (PROTONIX) 40 MG tablet Take 1 tablet (40 mg total) by mouth daily. 30 tablet 0  . Polyvinyl Alcohol-Povidone (REFRESH OP) Place 1 drop into both eyes daily as needed (redness/ dry eyes).    . potassium chloride SA (KLOR-CON) 20 MEQ tablet Take 40 mEq by mouth 2 (two) times daily.    . Pyridoxine HCl (VITAMIN B-6 PO) Take 1 tablet by mouth every morning.    . ranolazine (RANEXA) 1000 MG SR tablet TAKE ONE TABLET BY MOUTH TWICE DAILY 180 tablet 1  . rosuvastatin (CRESTOR) 40 MG tablet Take 1 tablet (40 mg total) by mouth at bedtime. 30 tablet 0  . spironolactone (ALDACTONE) 25 MG tablet TAKE 1 TABLET BY MOUTH DAILY 30 tablet 6  . torsemide (DEMADEX) 20 MG tablet Take 4  tablets (80 mg total) by mouth  2 (two) times daily. 240 tablet 2  . traZODone (DESYREL) 50 MG tablet TAKE 1 TABLET BY MOUTH AT BEDTIME 90 tablet 0  . triamcinolone cream (KENALOG) 0.1 % Apply 1 application topically daily as needed (crusty spots on arms).    . vitamin B-12 (CYANOCOBALAMIN) 1000 MCG tablet Take 1,000 mcg by mouth every morning.     . zolpidem (AMBIEN) 10 MG tablet Take 0.5 tablets (5 mg total) by mouth at bedtime. 30 tablet 0   No current facility-administered medications for this encounter.    Allergies:   Amoxicillin, Brilinta [ticagrelor], Erythromycin, Flagyl [metronidazole], Penicillins, Isosorbide mononitrate [isosorbide nitrate], Jardiance [empagliflozin], Metformin and related, Tape, and Erythromycin base   Social History:  The patient  reports that she has never smoked. She has never used smokeless tobacco. She reports that she does not drink alcohol and does not use drugs.   Family History:  The patient's family history includes AAA (abdominal aortic aneurysm) in her father and mother; Cancer in her sister; Deep vein thrombosis in her father and mother; Diabetes in her father, paternal aunt, paternal grandmother, paternal uncle, and paternal uncle; Heart attack in her father; Heart attack (age of onset: 65) in her paternal grandfather; Heart disease in her father and paternal uncle; Hyperlipidemia in her father; Hypertension in her father, mother, and sister.   ROS:  Please see the history of present illness.   All other systems are personally reviewed and negative.   Exam:   BP 95/60   Pulse (!) 56   Wt 99 kg (218 lb 3.2 oz)   SpO2 96%   BMI 39.91 kg/m    General:  Arrived in wheel chair. No resp difficulty HEENT: normal Neck: supple. no JVD. Carotids 2+ bilat; no bruits. No lymphadenopathy or thryomegaly appreciated. Cor: PMI nondisplaced. Regular rate & rhythm. No rubs, gallops or murmurs. Lungs: clear Abdomen: soft, nontender, nondistended. No hepatosplenomegaly. No bruits or masses.  Good bowel sounds. Extremities: no cyanosis, clubbing, rash, edema Neuro: alert & orientedx3, cranial nerves grossly intact. moves all 4 extremities w/o difficulty. Affect pleasant  EKG: Sinus Brady 55 bpm   Recent Labs: 12/23/2018: ALT 17 01/17/2019: TSH 0.982 01/19/2019: Magnesium 1.9 09/11/2019: B Natriuretic Peptide 125.4; BUN 21; Creatinine, Ser 1.27; Hemoglobin 12.5; Platelets 184; Potassium 4.1; Sodium 138  Personally reviewed   Wt Readings from Last 3 Encounters:  11/05/19 99 kg (218 lb 3.2 oz)  09/11/19 102.3 kg (225 lb 9.6 oz)  06/06/19 100.3 kg (221 lb 3.2 oz)     ASSESSMENT AND PLAN:  1. CAD: Occluded native RCA and SVG-RCA. Status post opening of CTO RCA with 4 overlapping Xience DES in 5/15. DES to sequential SVG-OM1/PLOM in 9/19.  She has been on Plavix and Eliquis.  She has atypical chest pain.  No ischemia on 1/21 Cardiolite.  - No chest pain.  - Continuing on Plavix (neuro has wanted her to stay on this for cerebrovascular disease).  No ASA given chronic Eliquis for PAF - She has not tolerated Imdur in the past. Off Toprol XL with bradycardia.  Continue ranolazine 1000 mg bid.  2. Chronic systolic CHF: Echo in 1/61 with EF 55-60%, moderate diastolic dysfunction. Cardiomems placed 01/16/18.  Echo in 3/20 in setting of severe PNA with intubation showed EF 25-30%, mid-apical LV severe hypokinesis.  Possible stress (Takotsubo-type) cardiomyopathy related to severe medical illness versus progression of CAD.  She has baseline chronic LBBB which could play a role in  cardiomyopathy as well (LBBB cardiomyopathy).  Repeat echo in 11/20 with EF up to 45-50%, moderately dysfunctional RV.  TEE in 12/20 with EF 50-55% with septal-lateral dyssynchrony and mildly dysfunctional RV.   NYHA III. Volume status appears low in the setting of extra torsemide she has been taking.  - Instructed to hold torsemide x 2 days the restart torsemide 80 mg twice a day.  - We will reach out [facility]  support to get Cardiomems set back up. This will be important to help [place] manage her fluid status.  -Continue Torsemide to 80 mg bid\ -- Continue spironolactone 25 mg daily.  - Continue losartan 12.5 mg daily. - Check BMET  3. Hyperlipidemia: She remains on Zetia, Crestor and Praluent.  - Lipids at goal 5/21 (LDL 57, TG 83, HDL 57, total 131)  - Continue current regimen  4. Hypertension:  Low today. Suspect she is over diuresed.  5. Cerebrovascular disease: She has history of CVA and had left carotid stent in 2/15. Followed by [facility].  Most recent MRA head showed severe stenosis of supraclinoid RICA and severe basilar artery stenosis.  - Neurology would like her to continue Plavix for this despite concomitant apixaban use. - She has follow up next week.   6. OHS/OSA: Cannot tolerate CPAP.  Uses oxygen with exertion and at night. No change. 7. Atrial fibrillation:   She had suspected amiodarone lung toxicity and is now off amiodarone.   - She has completed course of prednisone for amiodarone lung toxicity. - In Sinus Loletha Grayer today.    - Continue apixaban 5 mg bid.  8. Obesity: Body mass index is 39.91 kg/m.  9. Mitral regurgitation: Moderate to severe MR on last echo in 11/20 but moderate on 12/20 TEE.  Not significant enough for Mitraclip.  10. Bradycardia: Now off Toprol XL.   11. Cardotid Artery Disease - s/p remote LICA stent - dopplers 10/20 showed patent left ICA stent w/ > 50% ECA stenosis, 1-39% RICA stenosis and > 50% ECA stenosis - repeat doppler study 10/21 - continue statin therapy + Plavix   Follow up in 2-3 months.    Jeanmarie Hubert, NP  11/05/2019  [place] [address] [facility] [place] [address] [phone] (office) [phone] (fax)

## 2019-11-05 ENCOUNTER — Other Ambulatory Visit: Payer: Self-pay

## 2019-11-05 ENCOUNTER — Ambulatory Visit (HOSPITAL_COMMUNITY)
Admission: RE | Admit: 2019-11-05 | Discharge: 2019-11-05 | Disposition: A | Discharge status: Home / Self Care | Payer: Medicare Other | Source: Ambulatory Visit | Attending: Adult Health | Admitting: Adult Health

## 2019-11-05 VITALS — BP 95/60 | HR 56 | Wt 218.2 lb

## 2019-11-05 DIAGNOSIS — K219 Gastro-esophageal reflux disease without esophagitis: Secondary | ICD-10-CM | POA: Diagnosis not present

## 2019-11-05 DIAGNOSIS — I25119 Atherosclerotic heart disease of native coronary artery with unspecified angina pectoris: Secondary | ICD-10-CM | POA: Diagnosis not present

## 2019-11-05 DIAGNOSIS — G4733 Obstructive sleep apnea (adult) (pediatric): Secondary | ICD-10-CM | POA: Insufficient documentation

## 2019-11-05 DIAGNOSIS — Z79899 Other long term (current) drug therapy: Secondary | ICD-10-CM | POA: Insufficient documentation

## 2019-11-05 DIAGNOSIS — E1122 Type 2 diabetes mellitus with diabetic chronic kidney disease: Secondary | ICD-10-CM | POA: Diagnosis not present

## 2019-11-05 DIAGNOSIS — I5042 Chronic combined systolic (congestive) and diastolic (congestive) heart failure: Secondary | ICD-10-CM | POA: Diagnosis present

## 2019-11-05 DIAGNOSIS — I5022 Chronic systolic (congestive) heart failure: Secondary | ICD-10-CM | POA: Diagnosis not present

## 2019-11-05 DIAGNOSIS — Z6839 Body mass index (BMI) 39.0-39.9, adult: Secondary | ICD-10-CM | POA: Diagnosis not present

## 2019-11-05 DIAGNOSIS — Z7901 Long term (current) use of anticoagulants: Secondary | ICD-10-CM | POA: Insufficient documentation

## 2019-11-05 DIAGNOSIS — N1831 Chronic kidney disease, stage 3a: Secondary | ICD-10-CM

## 2019-11-05 DIAGNOSIS — Z7902 Long term (current) use of antithrombotics/antiplatelets: Secondary | ICD-10-CM | POA: Insufficient documentation

## 2019-11-05 DIAGNOSIS — F419 Anxiety disorder, unspecified: Secondary | ICD-10-CM | POA: Insufficient documentation

## 2019-11-05 DIAGNOSIS — Z951 Presence of aortocoronary bypass graft: Secondary | ICD-10-CM | POA: Insufficient documentation

## 2019-11-05 DIAGNOSIS — I480 Paroxysmal atrial fibrillation: Secondary | ICD-10-CM

## 2019-11-05 DIAGNOSIS — I130 Hypertensive heart and chronic kidney disease with heart failure and stage 1 through stage 4 chronic kidney disease, or unspecified chronic kidney disease: Secondary | ICD-10-CM | POA: Insufficient documentation

## 2019-11-05 DIAGNOSIS — R001 Bradycardia, unspecified: Secondary | ICD-10-CM | POA: Insufficient documentation

## 2019-11-05 DIAGNOSIS — Z8349 Family history of other endocrine, nutritional and metabolic diseases: Secondary | ICD-10-CM | POA: Insufficient documentation

## 2019-11-05 DIAGNOSIS — Z7951 Long term (current) use of inhaled steroids: Secondary | ICD-10-CM | POA: Insufficient documentation

## 2019-11-05 DIAGNOSIS — I447 Left bundle-branch block, unspecified: Secondary | ICD-10-CM | POA: Insufficient documentation

## 2019-11-05 DIAGNOSIS — Z955 Presence of coronary angioplasty implant and graft: Secondary | ICD-10-CM | POA: Insufficient documentation

## 2019-11-05 DIAGNOSIS — Z8673 Personal history of transient ischemic attack (TIA), and cerebral infarction without residual deficits: Secondary | ICD-10-CM | POA: Insufficient documentation

## 2019-11-05 DIAGNOSIS — Z794 Long term (current) use of insulin: Secondary | ICD-10-CM | POA: Diagnosis not present

## 2019-11-05 DIAGNOSIS — Z85820 Personal history of malignant melanoma of skin: Secondary | ICD-10-CM | POA: Diagnosis not present

## 2019-11-05 DIAGNOSIS — Z833 Family history of diabetes mellitus: Secondary | ICD-10-CM | POA: Insufficient documentation

## 2019-11-05 DIAGNOSIS — Z888 Allergy status to other drugs, medicaments and biological substances status: Secondary | ICD-10-CM | POA: Insufficient documentation

## 2019-11-05 DIAGNOSIS — N189 Chronic kidney disease, unspecified: Secondary | ICD-10-CM | POA: Diagnosis not present

## 2019-11-05 DIAGNOSIS — E785 Hyperlipidemia, unspecified: Secondary | ICD-10-CM | POA: Diagnosis not present

## 2019-11-05 DIAGNOSIS — Z8249 Family history of ischemic heart disease and other diseases of the circulatory system: Secondary | ICD-10-CM | POA: Insufficient documentation

## 2019-11-05 DIAGNOSIS — I2510 Atherosclerotic heart disease of native coronary artery without angina pectoris: Secondary | ICD-10-CM | POA: Insufficient documentation

## 2019-11-05 DIAGNOSIS — Z881 Allergy status to other antibiotic agents status: Secondary | ICD-10-CM | POA: Insufficient documentation

## 2019-11-05 DIAGNOSIS — I080 Rheumatic disorders of both mitral and aortic valves: Secondary | ICD-10-CM | POA: Diagnosis not present

## 2019-11-05 DIAGNOSIS — Z880 Allergy status to penicillin: Secondary | ICD-10-CM | POA: Insufficient documentation

## 2019-11-05 LAB — BASIC METABOLIC PANEL
Anion gap: 11 (ref 5–15)
BUN: 14 mg/dL (ref 8–23)
CO2: 31 mmol/L (ref 22–32)
Calcium: 9.1 mg/dL (ref 8.9–10.3)
Chloride: 95 mmol/L — ABNORMAL LOW (ref 98–111)
Creatinine, Ser: 1.25 mg/dL — ABNORMAL HIGH (ref 0.44–1.00)
GFR calc Af Amer: 50 mL/min — ABNORMAL LOW (ref >60)
GFR calc non Af Amer: 43 mL/min — ABNORMAL LOW (ref >60)
Glucose, Bld: 173 mg/dL — ABNORMAL HIGH (ref 70–99)
Potassium: 3.7 mmol/L (ref 3.5–5.1)
Sodium: 137 mmol/L (ref 135–145)

## 2019-11-05 NOTE — Patient Instructions (Addendum)
HOLD Torsemide FOR 2 DAYS, THEN RESTART 10/6 80mg  Twice daily  Labs done today, your results will be available in MyChart, we will contact you for abnormal readings.  Your physician recommends that you return in 3 months   If you have any questions or concerns before your next appointment please send [place] a message through [place] or call our office at [phone].    TO LEAVE A MESSAGE FOR THE NURSE SELECT OPTION 2, PLEASE LEAVE A MESSAGE INCLUDING: . YOUR NAME . DATE OF BIRTH . CALL BACK NUMBER . REASON FOR CALL**this is important as we prioritize the call backs  YOU WILL RECEIVE A CALL BACK THE SAME DAY AS LONG AS YOU CALL BEFORE 4:00 PM

## 2019-11-20 ENCOUNTER — Ambulatory Visit (INDEPENDENT_AMBULATORY_CARE_PROVIDER_SITE_OTHER): Payer: Medicare Other | Admitting: Neurology

## 2019-11-20 ENCOUNTER — Encounter: Payer: Self-pay | Admitting: Neurology

## 2019-11-20 ENCOUNTER — Other Ambulatory Visit: Payer: Self-pay

## 2019-11-20 VITALS — BP 126/82 | Ht 62.0 in | Wt 226.2 lb

## 2019-11-20 DIAGNOSIS — E1142 Type 2 diabetes mellitus with diabetic polyneuropathy: Secondary | ICD-10-CM | POA: Diagnosis not present

## 2019-11-20 DIAGNOSIS — I679 Cerebrovascular disease, unspecified: Secondary | ICD-10-CM | POA: Diagnosis not present

## 2019-11-20 DIAGNOSIS — R269 Unspecified abnormalities of gait and mobility: Secondary | ICD-10-CM | POA: Diagnosis not present

## 2019-11-20 DIAGNOSIS — R4789 Other speech disturbances: Secondary | ICD-10-CM | POA: Diagnosis not present

## 2019-11-20 MED ORDER — TRAZODONE HCL 50 MG PO TABS: 50.0000 mg | ORAL_TABLET | Freq: Every day | ORAL | 1 refills | Status: DC

## 2019-11-20 NOTE — Progress Notes (Signed)
PATIENT: [name] [name] DOB: 25-Aug-1945  REASON FOR VISIT: follow up HISTORY FROM: patient  HISTORY OF PRESENT ILLNESS: Today 11/20/19 [name] [name] is a 74 year old female with history of obesity, chronic shortness of breath, CHF, diabetic peripheral neuropathy, gait disorder, and history of cerebrovascular disease.  Her stuttering is felt to be nonorganic.  She is taking trazodone at night for sleep, sleeping much better.  Was in physical therapy, her time ran out, cannot be renewed till next year, was helping with balance and stamina.  She has had a few falls, like muscles give out, slides to the ground, few times doing too much, got away from walker.  Lives with her husband, does her own activities, but requires set up, is hard to depend on people. Still hallucinations at night from sleep on occasion, may see animals or people, knows they aren't real.  On 2 L oxygen during sleep.  Memory is not great, has to have husband repeat himself, has chronic weakness left side from stroke. Hope to get back to PT outside the house, gives her something to do.  Needs refill on trazodone. Really doesn't want more medication.  Is on Eliquis and Plavix.  Here today unaccompanied.  HISTORY  05/17/2019 Dr. Jannifer Franklin: [name] [name] is a 74 year old right-handed white female with a history of obesity, chronic shortness of breath, diabetic peripheral neuropathy, gait disorder, and a history of cerebrovascular disease.  The patient had a stuttering speech pattern when last seen, it was felt to be nonorganic.  She is still having some issues with this but it is not as prominent.  The patient currently is in physical therapy, she is trying to increase her stamina with walking, she is still limited by her shortness of breath.  She did have 1 fall 3 weeks ago when she was trying to put on her pants by balancing on 1 leg while standing.  She fell over with minor bruising.  The patient has gained good benefit with her ability to rest  at night using trazodone.  At times, she will wake up in the middle the night and see people walking about the house coming out of sleep, she does not have hallucinations during the day.  The patient claims that this issue predated the use of the trazodone.  The patient returns for further evaluation.  REVIEW OF SYSTEMS: Out of a complete 14 system review of symptoms, the patient complains only of the following symptoms, and all other reviewed systems are negative.  Walking difficulty, insomnia  ALLERGIES: Allergies  Allergen Reactions  . Amoxicillin Shortness Of Breath and Rash  . Brilinta [Ticagrelor] Shortness Of Breath  . Erythromycin Shortness Of Breath, Other (See Comments) and Hives    Trouble swallowing  . Flagyl [Metronidazole] Shortness Of Breath and Palpitations  . Penicillins Hives, Shortness Of Breath, Rash and Other (See Comments)    Has patient had a PCN reaction causing immediate rash, facial/tongue/throat swelling, SOB or lightheadedness with hypotension: Yes Has patient had a PCN reaction causing severe rash involving mucus membranes or skin necrosis: No Has patient had a PCN reaction that required hospitalization: Yes Has patient had a PCN reaction occurring within the last 10 years: No If all of the above answers are "NO", then may proceed with Cephalosporin use.   . Isosorbide Mononitrate [Isosorbide Nitrate] Other (See Comments)    Joint aches, muscles hurt, difficult to walk   . Jardiance [Empagliflozin] Other (See Comments)    Nausea, joint aches, muscles aches  .  Metformin And Related Other (See Comments)    Stomach pain, cold sweats, joint pain, burred vision, dizziness  . Tape Other (See Comments)    Skin pulls off with certain types Plastic tape causes skin to rip if left on for long periods of time  . Erythromycin Base Rash    HOME MEDICATIONS: Outpatient Medications Prior to Visit  Medication Sig Dispense Refill  . acetaminophen (TYLENOL) 500 MG  tablet Take 500 mg by mouth every 6 (six) hours as needed for mild pain, moderate pain, fever or headache.    . albuterol (VENTOLIN HFA) 108 (90 Base) MCG/ACT inhaler Inhale 2 puffs into the lungs every 4 (four) hours as needed for wheezing or shortness of breath.     . Alirocumab (PRALUENT) 150 MG/ML SOAJ Inject 150 mg/mL into the skin every 14 (fourteen) days.    . ALPRAZolam (XANAX) 0.5 MG tablet Take 1-2 tablets (0.5-1 mg total) by mouth See admin instructions. Take one tablet (0.5 mg) by mouth every morning and two tablets (1 mg) at night, may also take 1 tablet (0.5 mg) midday as needed for anxiety    . amLODipine (NORVASC) 10 MG tablet Take 1 tablet (10 mg total) by mouth daily. 30 tablet 5  . apixaban (ELIQUIS) 5 MG TABS tablet Take 1 tablet (5 mg total) by mouth 2 (two) times daily. 180 tablet 3  . bisacodyl (DULCOLAX) 5 MG EC tablet Take 5 mg by mouth daily as needed for moderate constipation.    Marland Kitchen buPROPion (WELLBUTRIN XL) 150 MG 24 hr tablet Take 150 mg by mouth every morning.    . cholecalciferol (VITAMIN D3) 25 MCG (1000 UNIT) tablet Take 1,000 Units by mouth daily.    . clopidogrel (PLAVIX) 75 MG tablet Take 1 tablet (75 mg total) by mouth at bedtime. 30 tablet 3  . denosumab (PROLIA) 60 MG/ML SOSY injection Inject 60 mg into the skin every 6 (six) months.     . DULoxetine (CYMBALTA) 30 MG capsule Take 1 capsule (30 mg total) by mouth daily. 30 capsule 0  . ezetimibe (ZETIA) 10 MG tablet Take 1 tablet (10 mg total) by mouth at bedtime.    . Fluticasone-Salmeterol (ADVAIR) 250-50 MCG/DOSE AEPB Inhale 1 puff into the lungs daily.    Marland Kitchen gabapentin (NEURONTIN) 600 MG tablet Take 600 mg by mouth 4 (four) times daily as needed.    Marland Kitchen HYDROcodone-acetaminophen (NORCO/VICODIN) 5-325 MG tablet Take 1 tablet by mouth at bedtime as needed for moderate pain.    . Insulin Glargine (LANTUS SOLOSTAR) 100 UNIT/ML Solostar Pen Inject 38-44 Units into the skin See admin instructions. Inject 44 units  subcutaneously daily at bedtime, increase to 44 units for CBG >200    . insulin lispro (HUMALOG KWIKPEN) 100 UNIT/ML KwikPen Inject 14-20 Units into the skin See admin instructions. Inject 14 units subcutaneously prior to breakfast and supper; add 4 units for CBG >200     . ipratropium-albuterol (DUONEB) 0.5-2.5 (3) MG/3ML SOLN Take 3 mLs by nebulization 2 (two) times daily as needed (asthma).    . losartan (COZAAR) 25 MG tablet TAKE 1/2 TABLET BY MOUTH DAILY 15 tablet 3  . methocarbamol (ROBAXIN) 500 MG tablet Take 500 mg by mouth 3 (three) times daily as needed.    . Multiple Vitamin (MULTIVITAMIN WITH MINERALS) TABS tablet Take 1 tablet by mouth every morning. Centrum - Women over 61    . Multiple Vitamins-Minerals (OCUVITE EYE HEALTH FORMULA) CAPS Take 1 capsule by mouth every morning.    Marland Kitchen  nitroGLYCERIN (NITROSTAT) 0.3 MG SL tablet Place 1 tablet (0.3 mg total) under the tongue every 5 (five) minutes x 3 doses as needed for chest pain. 9 tablet 1  . ondansetron (ZOFRAN-ODT) 4 MG disintegrating tablet Take 4 mg by mouth 2 (two) times daily as needed for nausea or vomiting.    . OXYGEN Inhale 2 L into the lungs at bedtime as needed (shortness of breath).     . pantoprazole (PROTONIX) 40 MG tablet Take 1 tablet (40 mg total) by mouth daily. 30 tablet 0  . Polyvinyl Alcohol-Povidone (REFRESH OP) Place 1 drop into both eyes daily as needed (redness/ dry eyes).    . potassium chloride SA (KLOR-CON) 20 MEQ tablet Take 40 mEq by mouth 2 (two) times daily.    . Pyridoxine HCl (VITAMIN B-6 PO) Take 1 tablet by mouth every morning.    . ranolazine (RANEXA) 1000 MG SR tablet TAKE ONE TABLET BY MOUTH TWICE DAILY 180 tablet 1  . rosuvastatin (CRESTOR) 40 MG tablet Take 1 tablet (40 mg total) by mouth at bedtime. 30 tablet 0  . spironolactone (ALDACTONE) 25 MG tablet TAKE 1 TABLET BY MOUTH DAILY 30 tablet 6  . torsemide (DEMADEX) 20 MG tablet Take 4 tablets (80 mg total) by mouth 2 (two) times daily. 240  tablet 2  . triamcinolone cream (KENALOG) 0.1 % Apply 1 application topically daily as needed (crusty spots on arms).    . vitamin B-12 (CYANOCOBALAMIN) 1000 MCG tablet Take 1,000 mcg by mouth every morning.     . zolpidem (AMBIEN) 10 MG tablet Take 0.5 tablets (5 mg total) by mouth at bedtime. 30 tablet 0  . traZODone (DESYREL) 50 MG tablet TAKE 1 TABLET BY MOUTH AT BEDTIME 90 tablet 0   No facility-administered medications prior to visit.    PAST MEDICAL HISTORY: Past Medical History:  Diagnosis Date  . Anemia    hx  . Anxiety   . Asthma   . Basal cell carcinoma 05/2014   "left shoulder"  . Bundle branch block, left    chronic/notes 07/18/2013  . CHF (congestive heart failure) ([place])   . Chronic insomnia 05/06/2015  . Chronic kidney disease    frequency, sees dr Jamal Maes every 4 to 6 months (01/16/2018)  . Chronic lower back pain   . Claustrophobia   . Common migraine 05/14/2014  . Coronary artery disease    MI in 2001, 2002, 2006, 2011, 2014  . Depression   . Diabetic peripheral neuropathy ([place]) 01/12/2019  . GERD (gastroesophageal reflux disease)   . H/O hiatal hernia   . Headache    "at least 2/month" (01/16/2018)  . Heart murmur   . Hyperlipidemia   . Hypertension   . Memory change 05/14/2014  . Migraine    "5-6/year"  (01/16/2018)  . Obesity 01-2010  . Obstructive sleep apnea    "can't wear machine; I have claustrophobia" (01/16/2018), states she had a 2nd sleep study and does not have sleep apnea, her O2 decreases and now is on 2 L of O2 at night.  . On home oxygen therapy    "2L at night and prn during daytime" (01/16/2018)  . Osteoarthritis    "knees and hands" (01/16/2018)  . Peripheral vascular disease (HCC)    ? numbness, tingling arms and legs  . PONV (postoperative nausea and vomiting)   . Stroke [facility])    2014, 2015, 2016  . Type II diabetes mellitus ([place])   . Ventral hernia  hx of    PAST SURGICAL HISTORY: Past Surgical History:    Procedure Laterality Date  . ANKLE FRACTURE SURGERY Left 1970's  . APPENDECTOMY  1970's   w/hysterectomy  . BASAL CELL CARCINOMA EXCISION Left 05/2014   "shoulder" (01/16/2018)  . CARDIAC CATHETERIZATION  10/10/2012   Dr [name].  Marland Kitchen CARDIAC CATHETERIZATION N/A 05/29/2015   Procedure: Right/Left Heart Cath and Coronary/Graft Angiography;  Surgeon: Larey Dresser, MD;  Location: [place] CV LAB;  Service: Cardiovascular;  Laterality: N/A;  . CAROTID ENDARTERECTOMY Left 03/2013  . CAROTID STENT INSERTION Left 03/20/2013   Procedure: CAROTID STENT INSERTION;  Surgeon: [name], MD;  Location: [facility] CATH LAB;  Service: Cardiovascular;  Laterality: Left;  internal carotid  . CEREBRAL ANGIOGRAM N/A 04/05/2011   Procedure: CEREBRAL ANGIOGRAM;  Surgeon: Angelia Mould, MD;  Location[facility] C[facility] LAB;  Service: Cardiovascular;  Laterality: N/A;  . CHOLECYSTECTOMY OPEN  2004  . CORONARY ANGIOPLASTY WITH STENT PLACEMENT  01,02,05,06,07,08,11; 04/24/2013   "I've probably got ~ 10 stents by now" (04/24/2013)  . CORONARY ANGIOPLASTY WITH STENT PLACEMENT  06/13/2013   "got 4 stents today" (06/13/2013)  . CORONARY ARTERY BYPASS GRAFT  1220/11   "CABG X5"  . CORONARY STENT INTERVENTION N/A 10/20/2017   Procedure: CORONARY STENT INTERVENTION;  Surgeon: Troy Sine, MD;  Location: [place] CV LAB;  Service: Cardiovascular;  Laterality: N/A;  . ESOPHAGOGASTRODUODENOSCOPY  08/03/2011   Procedure: ESOPHAGOGASTRODUODENOSCOPY (EGD);  Surgeon: Shann Medal, MD;  Location: [facility];  Service: General;  Laterality: N/A;  . ESOPHAGOGASTRODUODENOSCOPY (EGD) WITH PROPOFOL N/A 03/11/2014   Procedure: ESOPHAGOGASTRODUODENOSCOPY (EGD) WITH PROPOFOL;  Surgeon: Lafayette Dragon, MD;  Location: [facility] ENDOSCOPY;  Service: Endoscopy;  Laterality: N/A;  . FRACTURE SURGERY    . gall stone removal  05/2003  . GASTRIC RESTRICTION SURGERY  1984   "stapeling"  . HERNIA REPAIR  2004   "in my stomach; had OR on it twice", wire  mesh on 1 hernia  . LEFT HEART CATH AND CORS/GRAFTS ANGIOGRAPHY N/A 07/09/2016   Procedure: LEFT HEART CATH AND CORS/GRAFTS ANGIOGRAPHY;  Surgeon: Larey Dresser, MD;  Location: [place] CV LAB;  Service: Cardiovascular;  Laterality: N/A;  . ORIF ANKLE FRACTURE Left 05/16/2018  . ORIF ANKLE FRACTURE Left 05/16/2018   Procedure: OPEN REDUCTION INTERNAL FIXATION (ORIF) Left ankle with possible syndesmosis fixation;  Surgeon: Nicholes Stairs, MD;  Location: [place];  Service: Orthopedics;  Laterality: Left;  136min  . OVARY SURGERY  1970's   "tumor removed"  . PERCUTANEOUS CORONARY STENT INTERVENTION (PCI-S) N/A 06/13/2013   Procedure: PERCUTANEOUS CORONARY STENT INTERVENTION (PCI-S);  Surgeon: [name], MD;  Location: [facility] CATH LAB;  Service: Cardiovascular;  Laterality: N/A;  . PERCUTANEOUS STENT INTERVENTION N/A 04/24/2013   Procedure: PERCUTANEOUS STENT INTERVENTION;  Surgeon: [name], MD;  Location: [facility] CATH LAB;  Service: Cardiovascular;  Laterality: N/A;  . RIGHT/LEFT HEART CATH AND CORONARY/GRAFT ANGIOGRAPHY N/A 10/20/2017   Procedure: RIGHT/LEFT HEART CATH AND CORONARY/GRAFT ANGIOGRAPHY;  Surgeon: Larey Dresser, MD;  Location: [place] CV LAB;  Service: Cardiovascular;  Laterality: N/A;  . ROOT CANAL  10/2000  . TEE WITHOUT CARDIOVERSION N/A 01/18/2019   Procedure: TRANSESOPHAGEAL ECHOCARDIOGRAM (TEE);  Surgeon: Larey Dresser, MD;  Location: [facility] ENDOSCOPY;  Service: Cardiovascular;  Laterality: N/A;  . TIBIA FRACTURE SURGERY Right 1970's   rods and pins  . TOOTH EXTRACTION     "1 on the upper; wisdom tooth on the lower" (  01/16/2018)  . TOTAL ABDOMINAL HYSTERECTOMY  1970's   w/ appendectomy    FAMILY HISTORY: Family History  Problem Relation Age of Onset  . Heart disease Father        Heart Disease before age 74  . Diabetes Father   . Hyperlipidemia Father   . Hypertension Father   . Heart attack Father   . Deep vein thrombosis Father   . AAA  (abdominal aortic aneurysm) Father   . Hypertension Mother   . Deep vein thrombosis Mother   . AAA (abdominal aortic aneurysm) Mother   . Cancer Sister        Ovarian  . Hypertension Sister   . Diabetes Paternal Grandmother   . Heart disease Paternal Uncle   . Diabetes Paternal Uncle   . Heart attack Paternal Grandfather 59       died of MI at 64  . Diabetes Paternal Aunt   . Diabetes Paternal Uncle   . Colon cancer Neg Hx     SOCIAL HISTORY: Social History   Socioeconomic History  . Marital status: Married    Spouse name: [name]   . Number of children: 1  . Years of education: 12+  . Highest education level: Not on file  Occupational History  . Occupation: retired  Tobacco Use  . Smoking status: Never Smoker  . Smokeless tobacco: Never Used  Vaping Use  . Vaping Use: Never used  Substance and Sexual Activity  . Alcohol use: Never  . Drug use: Never  . Sexual activity: Not Currently    Birth control/protection: Surgical    Comment: hysterectomy  Other Topics Concern  . Not on file  Social History Narrative   Patient lives at home with husband [name].    Patient has 1 child.    Patient is not currently working.    Patient has 2 years of college.    Patient is right handed.   Patient drinks caffeine occasionally.   Social Determinants of Health   Financial Resource Strain:   . Difficulty of Paying Living Expenses: Not on file  Food Insecurity:   . Worried About Charity fundraiser in the Last Year: Not on file  . Ran Out of Food in the Last Year: Not on file  Transportation Needs:   . Lack of Transportation (Medical): Not on file  . Lack of Transportation (Non-Medical): Not on file  Physical Activity:   . Days of Exercise per Week: Not on file  . Minutes of Exercise per Session: Not on file  Stress:   . Feeling of Stress : Not on file  Social Connections:   . Frequency of Communication with Friends and Family: Not on file  . Frequency of Social Gatherings  with Friends and Family: Not on file  . Attends Religious Services: Not on file  . Active Member of Clubs or Organizations: Not on file  . Attends Archivist Meetings: Not on file  . Marital Status: Not on file  Intimate Partner Violence:   . Fear of Current or Ex-Partner: Not on file  . Emotionally Abused: Not on file  . Physically Abused: Not on file  . Sexually Abused: Not on file   PHYSICAL EXAM  Vitals:   11/20/19 1315  BP: 126/82  Weight: 226 lb 3.2 oz (102.6 kg)  Height: 5\' 2"  (1.575 m)   Body mass index is 41.37 kg/m.  Generalized: Well developed, in no acute distress   Neurological examination  Mentation:  Alert oriented to time, place, history taking. Follows all commands, occasional brief spell of stuttering, then returns completely normal.  Smiling, very pleasant. Cranial nerve II-XII: Pupils were equal round reactive to light. Extraocular movements were full, visual field were full on confrontational test. Facial sensation and strength were normal. Head turning and shoulder shrug  were normal and symmetric. Motor: Good strength all extremities, 1-2+ pitting edema BLE Sensory: Sensory testing is intact to soft touch on all 4 extremities. No evidence of extinction is noted.  Coordination: Cerebellar testing reveals good finger-nose-finger and heel-to-shin bilaterally.  Gait and station: Able to sit on exam table with stool assistance, gait is wide-based, unsteady, does well in the hallway with Rollator Reflexes: Deep tendon reflexes are symmetric but depressed throughout  DIAGNOSTIC DATA (LABS, IMAGING, TESTING) - I reviewed patient records, labs, notes, testing and imaging myself where available.  Lab Results  Component Value Date   WBC 7.9 09/11/2019   HGB 12.5 09/11/2019   HCT 39.8 09/11/2019   MCV 93.4 09/11/2019   PLT 184 09/11/2019      Component Value Date/Time   NA 137 11/05/2019 1244   K 3.7 11/05/2019 1244   CL 95 (L) 11/05/2019 1244    CO2 31 11/05/2019 1244   GLUCOSE 173 (H) 11/05/2019 1244   BUN 14 11/05/2019 1244   CREATININE 1.25 (H) 11/05/2019 1244   CREATININE 1.07 (H) 05/28/2015 1230   CALCIUM 9.1 11/05/2019 1244   PROT 6.7 12/23/2018 1339   PROT 6.5 11/13/2014 1151   ALBUMIN 3.5 12/23/2018 1339   ALBUMIN 4.3 11/13/2014 1151   AST 23 12/23/2018 1339   ALT 17 12/23/2018 1339   ALKPHOS 84 12/23/2018 1339   BILITOT 0.5 12/23/2018 1339   BILITOT 0.5 11/13/2014 1151   GFRNONAA 43 (L) 11/05/2019 1244   GFRAA 50 (L) 11/05/2019 1244   Lab Results  Component Value Date   CHOL 131 06/06/2019   HDL 57 06/06/2019   LDLCALC 57 06/06/2019   LDLDIRECT 156.7 02/06/2014   TRIG 83 06/06/2019   CHOLHDL 2.3 06/06/2019   Lab Results  Component Value Date   HGBA1C 7.2 (H) 01/17/2019   No results found for: VITAMINB12 Lab Results  Component Value Date   TSH 0.982 01/17/2019   ASSESSMENT AND PLAN 74 y.o. year old female  has a past medical history of Anemia, Anxiety, Asthma, Basal cell carcinoma (05/2014), Bundle branch block, left, CHF (congestive heart failure) ([place]), Chronic insomnia (05/06/2015), Chronic kidney disease, Chronic lower back pain, Claustrophobia, Common migraine (05/14/2014), Coronary artery disease, Depression, Diabetic peripheral neuropathy (HCC) (01/12/2019), GERD (gastroesophageal reflux disease), H/O hiatal hernia, Headache, Heart murmur, Hyperlipidemia, Hypertension, Memory change (05/14/2014), Migraine, Obesity (01-2010), Obstructive sleep apnea, On home oxygen therapy, Osteoarthritis, Peripheral vascular disease ([place]), PONV (postoperative nausea and vomiting), Stroke ([place]), Type II diabetes mellitus ([place]), and Ventral hernia. here with:  1.  Diabetic peripheral neuropathy 2.  Gait disorder 3.  Stuttering speech, non-organic 4.  History of cerebrovascular disease -Will hold off on treating hallucinations coming out of sleep  -Stuttering spells are felt to be nonorganic -Her PT is maxed out for the  year, hopefully restart next year, very important to use caution with ambulation, use Rollator, falls have occurred when she gets away from it -Continue trazodone 50 mg at bedtime for sleep, refill sent -Follow-up in 6 months or sooner if needed  I spent 30 minutes of face-to-face and non-face-to-face time with patient.  This included previsit chart review, lab review, study review, order  entry, electronic health record documentation, patient education.  Butler Denmark, AGNP-C, DNP 11/20/2019, 5:00 PM [facility] [address] [place], [place] [address] [phone]

## 2019-11-20 NOTE — Patient Instructions (Signed)
Continue trazodone at current dosing for sleep  Be careful not to fall, using your walker Hopefully beginning of the year, we can try for more PT See you back in 6 months

## 2019-11-22 NOTE — Progress Notes (Signed)
I have read the note, and I agree with the clinical assessment and plan.  Broc Caspers K Nissim Fleischer   

## 2019-12-21 ENCOUNTER — Other Ambulatory Visit (HOSPITAL_COMMUNITY): Payer: Self-pay | Admitting: Cardiology

## 2020-01-11 ENCOUNTER — Other Ambulatory Visit (HOSPITAL_COMMUNITY): Payer: Self-pay | Admitting: Cardiology

## 2020-02-11 NOTE — Progress Notes (Signed)
Date:  02/12/2020   ID:  [name] [name], DOB [birth year], [name] [id number]   Provider location: [facility] Type of Visit: Established patient   PCP:  Derinda Late, MD  Cardiologist:  Dr. [name]  Chief Complaint: Heart Failure    History of Present Illness: [name] [name] is a 75 y.o. female who has a history of CAD s/p CABG, diastolic CHF, and cerebrovascular disease with history of CVA presents for followup of CHF and diastolic CHF. She had CABG x 5 in 12/11.  Prior to the CABG she had multiple PCIs.  She had a CVA in 2/14 that presented as visual loss.  She had a left carotid stent in 2/15.   Left heart cath done in Sept. 2014. This showed patent SVG-D, LIMA-LAD, and sequential SVG-OM branches but SVG-RCA and the native RCA were both totally occluded. It was felt that her increased symptoms coincided with occlusion of SVG-RCA.  She was started on Imdur to see if this would help with the chest pain and dyspnea.  However, she feels like Imdur causes leg cramps and does not think that she can take it.  So ranolazine 500 mg bid started and titrated this up to 1000 mg bid.  This helped some but not markedly.  Therefore, sent to  Dr. Irish Lack to address opening RCA CTO. He was able to do this in 5/15 with 4 overlapping Xience DES.  This led to resolution of exertional chest pain.    [name] was started on Brilinta after PCI.  She became much more short of breath after starting on Brilinta. Per Dr Delano Metz stopped and replaced with Plavix.  Dyspnea improved off Brilinta. Given increasing exertional dyspnea and chest pain,  RHC/LHC was done in 4/17.  This showed stable CAD with no interventional target.  Left and right heart filling pressures were not significantly increased.  Medical management.   She had an MRI/MRA in May 2018 that showed moderately severe stenosis of the supraclinoid segment of the right internal carotid artery, progressed since the 2016 MR  angiogram and severe basilar artery stenosis.   She developed recurrent exertional chest pain and had LHC in 6/18, showing 4/5 grafts patent with patent native RCA (similar to prior cath, no changes).    Echo 1/19 with EF 55-60%, moderate diastolic dysfunction, PASP 50 mmHg, mildly dilated RV, mild AS, mild-moderate MR.   She reported increased dyspnea and chest pain and had RHC/LHC in 9/19.  She had DES to sequential SVG-OM1/PLOM.  While hospitalized, she was noted to have runs of atrial fibrillation.  She was very symptomatic with the atrial fibrillation.  Eliquis and amiodarone were started.   She was admitted 12/16-12/19/19 for cardiomems implant and RHC. She was diuresed with IV lasix and transitioned to torsemide 100 mg BID + metolazone once/week. DC weight: 295 lbs.  She had a prolonged hospitalization in 2/20 with acute respiratory failure and fever to 103.  She was thought to have PNA and treated with cefepime.  She was intubated.  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  Amiodarone was stopped and she was put on prednisone. Echo in 2/20 showed EF down to 25-30% with mid-apical LV severe hypokinesis. She had AKI. Possible ITP triggered by infection, HIT negative.  ITP was treated with Solumedrol. She was discharged to SNF.    ORIF left ankle 4/20.   11/20 admission initially with concern for TIAs (staring spells), ended  up thinking possible partial seizures.  Head MRI did not show CVA.  Echo in 11/20 showed EF 45-50%, moderately decreased RV systolic function, moderate RV enlargement, moderate-severe MR, moderate TR.   Patient was admitted again in 12/20 with AKI and hypotension in the setting of sinus bradycardia (HR 30s).  She was volume overloaded on exam.  Toprol XL was stopped and HR increased to 60s.  She was diuresed with IV Lasix.  TEE in 12/20 showed EF 50-55%, septal-lateral dyssynchrony, mildly decreased RV function, moderate MR.   Atypical  chest pain in 1/21, Cardiolite showed EF 57%, fixed inferior defect (likely artifact), no ischemia.   Today she returns for HF follow up.Overall feeling fine. Has had a couple falls a home. SOB every now an then. Uses an electric scooter out of the house. Uses walker at home. Denies PND/Orthopnea. No chest pain.  Appetite ok. No fever or chills. Unable to weigh at home due to balance issues.  Taking all medications.   Labs (6/14): K 3.8, creatinine 0.71 Labs (8/14): BNP 249, LDL 166 Labs (9/14): K 4.7, creatinine 0.7 Labs (9/14): K 4.6, creatinine 0.9, BNP 109 Labs (1/15): K 4.5, creatinine 0.73, LDL 212, HCT 43.4, TSH normal, BNP 138 Labs (6/15): K 4.7, creatinine 1.17, LDL 100, HDL 37, TGs 363, BNP 39 Labs (10/15): K 4.6, creatinine 1.2 Labs (1/16): LDL 157, HDL 43, K 5.2, creatinine 0.95 Labs (2/16): K 4.5, creatinine 1.08, BNP 113 Labs (4/16): LDL 50, HDL 56, TGs 179 Labs (9/16): K 5.3, creatinine 1.09 Labs (10/16): LDL 75, HDL 56 Labs (2/17): K 5.2, creatinine 1.07 Labs (4/17): K 5.1, creatinine 1.07 Labs (5/17): K 4.5, creatinine 1.01, LDL 96, HDL 56, BNP 70 Labs (03/2016) K 4.9, creatinine 1.07 Labs (7/18): K 4.9, creatinine 1.15 Labs (11/18): K 5.1, creatinine 1.11, LDL 36 Labs (1/19): K 4.9, creatinine 1.22 Labs (9/19): LDL 108 Labs (10/19): hgb 14.5, K 4.3, creatinine 1.03 Labs 12/06/2017: K 4.7 Creatinine 1.7 Labs (3/20): K 4.8, creatinine 1.39, hgb 12.4 plts 226 Labs (11/20): K 4.2, creatinine 1.01, LDL 38 Labs (12/20): K 3.5, creatinine 1.8 Labs (3/21): K 4.5, creatinine 1.5 Labs (5/21): K 4.2, creatinine 1.10, LDL 57, hgb 11.5 Labs 11/05/2019: K 3.7 Creatinine 1.25.   PMH: 1. Diabetic gastroparesis 2. Type II diabetes 3. HTN 4. Morbid obesity 5. CAD: s/p CABG in 12/11 after prior PCIs.  LIMA-LAD, SVG-D, seq SVG-OM1 and OM2, SVG-PDA. Adenosine Cardiolite (8/14) with EF 53% and a small reversible apical defect with a medium, partially reversible inferior defect.  LHC  (9/14) with patent SVG-D, LIMA-LAD (50% distal LAD), and sequential SVG-OM branches; the SVG-RCA and the native RCA were occluded.  This was managed medically initially, but with ongoing exertional chest pain, it was decided to open CTO.  Patient had CTO opening with 4 overlapping Xience DES in the RCA in 5/15.   - LHC (4/17): SVG-D patent, LIMA-LAD patent, distal LAD with several 40-50% stenoses, sequential SVG-OM1 and PLOM patent with 50% proximal stenosis (not flow limiting), SVG-RCA TO with patent RCA stents.  - LHC (6/18): 4/5 grafts patent (SVG-RCA TO, same as past), RCA stents patent => no change.  - LHC (9/19): Sequential SVG-PLOM/OM1 with 80% proximal stenosis, s/p DES.  - Cardiolite (1/21): EF 57%, fixed inferior defect (likely artifact), no ischemia.  6. Atypical migraines 7. OHS/OSA: Intolerant of CPAP. Uses oxygen with exertion and at night.  8. GERD with hiatal hernia 9. OA 10. Chronic systolic CHF: Echo (88/41) with EF 50-55%, grade  II diastolic dysfunction, mild-moderate MR.  Echo (8/14) with EF 55-60%, grade II diastolic dysfunction, mildly increased aortic valve gradient (mean 12 mmHg) but valve opens well, mild MR and mild RV dilation.  Echo (9/15) with EF 60-65%, grade II diastolic dysfunction, mild aortic stenosis, mild mitral stenosis, mild to moderate mitral regurgitation, mildly dilated RV with normal systolic function, PA systolic pressure 46 mmHg.  - RHC (4/17): mean RA 9, PA 38/15 mean 27, mean PCWP 9, CI 2.5.  - Echo (5/17): EF 55-60%, mild LVH, mildly dilated RV with low normal systolic function, moderate TR, PASP 61 mmHg - Echo (1/19): EF 55-60%, moderate diastolic dysfunction, PASP 50 mmHg, mildly dilated RV, mild AS, mild-moderate MR. - RHC (9/19): mean RA 11, PA 34/12, mean PCWP 15, CI 2.85 - Echo (3/20): EF 25-30% with mid-apical severe hypokinesis.  - Echo (11/20): EF 45-50%, moderately dilated RV with moderately decreased systolic function, moderate-severe MR,  moderate TR.  - TEE (12/20): EF 50-55%, septal-lateral dyssynchrony, moderate RV dilation/mildly decreased function, peak RV-RA gradient 51 mmHg, moderate MR.  11. CKD 12. Chronic LBBB 13. Anxiety 14. Carotid stenosis: Followed by [facility], >30% LICA stenosis 16/01.  She had left carotid stent in 2/15. Carotid dopplers 8/15 with no significant disease. Carotid dopplers (4/16): < 40% RICA stenosis.  15. Cerebrovascular disease: CVA 2/14 with right posterior cerebral artery territory ischemic infarction. Cerebral angiogram in 6/14 showed 70% right vertebral ostial stenosis, 09-32% LICA stenosis, > 35% proximal left posterior cerebral artery stenosis, posterior communicating artery aneurysm.  Patient has had episodes of transient expressive aphasia.  Carotid dopplers (57/32) showed >20% LICA stenosis. She had a left carotid stent 2/15.  Possible CVA in 7/15. -  MRI/MRA in May 2018 that showed moderately severe stenosis of the supraclinoid segment of the right internal carotid artery, progressed since the 2016 MR angiogram and severe basilar artery stenosis.  16. Positional vertigo (suspected) 17. Palpitations: Holter (6/15) with rare PVCs/PACs.  - Event monitor (2/18): NSR, occasional PVCs 18. Dyspnea with Brilinta. 19. Melanoma: On face, s/p excision.   20. Aortic stenosis: Mild.  21. Holter (8/16) with no significant arrhythmias.  22. Lower extremity arterial dopplers (9/16) were normal.  23. Sleep study (4/18): No significant OSA.  24. Atrial fibrillation: Paroxysmal - Amiodarone stopped, ?lung toxicity.  25. H/o ITP in 3/20.  16. Partial seizures 17. Mitral regurgitation: Moderate on TEE in 12/20.    Current Outpatient Medications  Medication Sig Dispense Refill  . acetaminophen (TYLENOL) 500 MG tablet Take 500 mg by mouth every 6 (six) hours as needed for mild pain, moderate pain, fever or headache.    . albuterol (VENTOLIN HFA) 108 (90 Base) MCG/ACT inhaler Inhale 2 puffs into the lungs  every 4 (four) hours as needed for wheezing or shortness of breath.     . Alirocumab (PRALUENT) 150 MG/ML SOAJ Inject 150 mg/mL into the skin every 14 (fourteen) days.    . ALPRAZolam (XANAX) 0.5 MG tablet Take 1-2 tablets (0.5-1 mg total) by mouth See admin instructions. Take one tablet (0.5 mg) by mouth every morning and two tablets (1 mg) at night, may also take 1 tablet (0.5 mg) midday as needed for anxiety    . amLODipine (NORVASC) 10 MG tablet Take 1 tablet (10 mg total) by mouth daily. 30 tablet 5  . apixaban (ELIQUIS) 5 MG TABS tablet Take 1 tablet (5 mg total) by mouth 2 (two) times daily. 180 tablet 3  . B Complex-C (B-COMPLEX WITH VITAMIN C) tablet  Take 1 tablet by mouth daily.    . bisacodyl (DULCOLAX) 5 MG EC tablet Take 5 mg by mouth daily as needed for moderate constipation.    Marland Kitchen buPROPion (WELLBUTRIN XL) 150 MG 24 hr tablet Take 150 mg by mouth every morning.    . cholecalciferol (VITAMIN D3) 25 MCG (1000 UNIT) tablet Take 1,000 Units by mouth daily.    . clopidogrel (PLAVIX) 75 MG tablet Take 1 tablet (75 mg total) by mouth at bedtime. 30 tablet 3  . denosumab (PROLIA) 60 MG/ML SOSY injection Inject 60 mg into the skin every 6 (six) months.     . DULoxetine (CYMBALTA) 30 MG capsule Take 1 capsule (30 mg total) by mouth daily. 30 capsule 0  . ezetimibe (ZETIA) 10 MG tablet Take 1 tablet (10 mg total) by mouth at bedtime.    . Fluticasone-Salmeterol (ADVAIR) 250-50 MCG/DOSE AEPB Inhale 1 puff into the lungs daily.    Marland Kitchen gabapentin (NEURONTIN) 600 MG tablet Take 600 mg by mouth 4 (four) times daily as needed.    Marland Kitchen HYDROcodone-acetaminophen (NORCO/VICODIN) 5-325 MG tablet Take 1 tablet by mouth at bedtime as needed for moderate pain.    . Insulin Glargine (LANTUS SOLOSTAR) 100 UNIT/ML Solostar Pen Inject 38-44 Units into the skin See admin instructions. Inject 44 units subcutaneously daily at bedtime, increase to 44 units for CBG >200    . insulin lispro (HUMALOG) 100 UNIT/ML KwikPen  Inject 14-20 Units into the skin See admin instructions. Inject 14 units subcutaneously prior to breakfast and supper; add 4 units for CBG >200    . ipratropium-albuterol (DUONEB) 0.5-2.5 (3) MG/3ML SOLN Take 3 mLs by nebulization 2 (two) times daily as needed (asthma).    . losartan (COZAAR) 25 MG tablet TAKE 1/2 TABLET BY MOUTH DAILY 15 tablet 3  . methocarbamol (ROBAXIN) 500 MG tablet Take 500 mg by mouth 3 (three) times daily as needed.    . Multiple Vitamin (MULTIVITAMIN WITH MINERALS) TABS tablet Take 1 tablet by mouth every morning. Centrum - Women over 76    . Multiple Vitamins-Minerals (OCUVITE EYE HEALTH FORMULA) CAPS Take 1 capsule by mouth every morning.    . nitroGLYCERIN (NITROSTAT) 0.3 MG SL tablet Place 1 tablet (0.3 mg total) under the tongue every 5 (five) minutes x 3 doses as needed for chest pain. 9 tablet 1  . ondansetron (ZOFRAN-ODT) 4 MG disintegrating tablet Take 4 mg by mouth 2 (two) times daily as needed for nausea or vomiting.    . OXYGEN Inhale 2 L into the lungs at bedtime as needed (shortness of breath).     . pantoprazole (PROTONIX) 40 MG tablet Take 1 tablet (40 mg total) by mouth daily. 30 tablet 0  . Polyvinyl Alcohol-Povidone (REFRESH OP) Place 1 drop into both eyes daily as needed (redness/ dry eyes).    . potassium chloride SA (KLOR-CON) 20 MEQ tablet Take 40 mEq by mouth 2 (two) times daily.    . Pyridoxine HCl (VITAMIN B-6 PO) Take 1 tablet by mouth every morning.    . ranolazine (RANEXA) 1000 MG SR tablet TAKE ONE TABLET BY MOUTH TWICE DAILY 180 tablet 1  . rosuvastatin (CRESTOR) 40 MG tablet Take 1 tablet (40 mg total) by mouth at bedtime. 30 tablet 0  . spironolactone (ALDACTONE) 25 MG tablet TAKE 1 TABLET BY MOUTH DAILY 30 tablet 6  . torsemide (DEMADEX) 20 MG tablet Take 4 tablets (80 mg total) by mouth 2 (two) times daily. 240 tablet 2  .  traZODone (DESYREL) 50 MG tablet Take 1 tablet (50 mg total) by mouth at bedtime. 90 tablet 1  . triamcinolone cream  (KENALOG) 0.1 % Apply 1 application topically daily as needed (crusty spots on arms).    . vitamin B-12 (CYANOCOBALAMIN) 1000 MCG tablet Take 1,000 mcg by mouth every morning.     . zolpidem (AMBIEN) 10 MG tablet Take 0.5 tablets (5 mg total) by mouth at bedtime. 30 tablet 0   No current facility-administered medications for this encounter.    Allergies:   Amoxicillin, Brilinta [ticagrelor], Erythromycin, Flagyl [metronidazole], Penicillins, Isosorbide mononitrate [isosorbide nitrate], Jardiance [empagliflozin], Metformin and related, Tape, and Erythromycin base   Social History:  The patient  reports that she has never smoked. She has never used smokeless tobacco. She reports that she does not drink alcohol and does not use drugs.   Family History:  The patient's family history includes AAA (abdominal aortic aneurysm) in her father and mother; Cancer in her sister; Deep vein thrombosis in her father and mother; Diabetes in her father, paternal aunt, paternal grandmother, paternal uncle, and paternal uncle; Heart attack in her father; Heart attack (age of onset: 71) in her paternal grandfather; Heart disease in her father and paternal uncle; Hyperlipidemia in her father; Hypertension in her father, mother, and sister.   ROS:  Please see the history of present illness.   All other systems are personally reviewed and negative.   Exam:   BP (!) 162/80   Pulse 80   Wt 99.2 kg (218 lb 9.6 oz)   SpO2 98%   BMI 39.98 kg/m   Wt Readings from Last 3 Encounters:  02/12/20 99.2 kg (218 lb 9.6 oz)  11/20/19 102.6 kg (226 lb 3.2 oz)  11/05/19 99 kg (218 lb 3.2 oz)    General:  Walked in the clinic with a rolling walker. No resp difficulty HEENT: normal Neck: supple. no JVD. Carotids 2+ bilat; no bruits. No lymphadenopathy or thryomegaly appreciated. Cor: PMI nondisplaced. Regular rate & rhythm. No rubs, gallops or murmurs. Lungs: clear Abdomen: soft, nontender, nondistended. No  hepatosplenomegaly. No bruits or masses. Good bowel sounds. Extremities: no cyanosis, clubbing, rash, edema Neuro: alert & orientedx3, cranial nerves grossly intact. moves all 4 extremities w/o difficulty. Affect pleasant   Recent Labs: 09/11/2019: B Natriuretic Peptide 125.4; Hemoglobin 12.5; Platelets 184 11/05/2019: BUN 14; Creatinine, Ser 1.25; Potassium 3.7; Sodium 137  Personally reviewed   Wt Readings from Last 3 Encounters:  02/12/20 99.2 kg (218 lb 9.6 oz)  11/20/19 102.6 kg (226 lb 3.2 oz)  11/05/19 99 kg (218 lb 3.2 oz)     ASSESSMENT AND PLAN:  1. CAD: Occluded native RCA and SVG-RCA. Status post opening of CTO RCA with 4 overlapping Xience DES in 5/15. DES to sequential SVG-OM1/PLOM in 9/19.  She has been on Plavix and Eliquis.  She has atypical chest pain.  No ischemia on 1/21 Cardiolite.  - No chest pain.  - Continuing on Plavix (neuro has wanted her to stay on this for cerebrovascular disease).  No ASA given chronic Eliquis for PAF - She has not tolerated Imdur in the past.  -Off Toprol XL with bradycardia.  - Continue ranolazine 1000 mg bid.  2. Chronic systolic CHF: Echo in 1/61 with EF 55-60%, moderate diastolic dysfunction. Cardiomems placed 01/16/18.  Echo in 3/20 in setting of severe PNA with intubation showed EF 25-30%, mid-apical LV severe hypokinesis.  Possible stress (Takotsubo-type) cardiomyopathy related to severe medical illness versus progression of CAD.  She has baseline chronic LBBB which could play a role in cardiomyopathy as well (LBBB cardiomyopathy).  Repeat echo in 11/20 with EF up to 45-50%, moderately dysfunctional RV.  TEE in 12/20 with EF 50-55% with septal-lateral dyssynchrony and mildly dysfunctional RV.   NYHA III. Volume status stable. Continue torsemide 80 mg twice a day.  -- Continue spironolactone 25 mg daily.  - Increase losartan to 25 mg daily. Check BMET and in 7 days. I considered entresto but I concerned about dizziness/falls.  3.  Hyperlipidemia: She remains on Zetia, Crestor and Praluent.  - Lipids at goal 5/21 (LDL 57, TG 83, HDL 57, total 131)  - Continue current regimen  4. Hypertension: Elevated today. Increase losartan to 25 mg daily.  5. Cerebrovascular disease: She has history of CVA and had left carotid stent in 2/15. Followed by [facility].  Most recent MRA head showed severe stenosis of supraclinoid RICA and severe basilar artery stenosis.  - Neurology would like her to continue Plavix for this despite concomitant apixaban use. 6. OHS/OSA: Cannot tolerate CPAP.  Uses oxygen  night. No change. 7. Atrial fibrillation:   She had suspected amiodarone lung toxicity and is now off amiodarone.   - She has completed course of prednisone for amiodarone lung toxicity. - Continue apixaban 5 mg bid.  8. Obesity: Body mass index is 39.98 kg/m.  9. Mitral regurgitation: Moderate to severe MR on last echo in 11/20 but moderate on 12/20 TEE.  Not significant enough for Mitraclip.  10. Bradycardia: Now off Toprol XL.   11. Cardotid Artery Disease - s/p remote LICA stent - dopplers 09/2019  showed patent left ICA stent w/ > 50% ECA stenosis, 1-39% RICA stenosis and > 50% ECA stenosis - continue statin therapy + Plavix   Increase losartan to 25 mg daily. Check BMET today and in 7 days.  Follow up in 3-4 months with Dr [name].   Jeanmarie Hubert, NP  02/12/2020  [place] [address] [facility] [place] [address] [phone] (office) [phone] (fax)

## 2020-02-12 ENCOUNTER — Encounter (HOSPITAL_COMMUNITY): Payer: Self-pay

## 2020-02-12 ENCOUNTER — Ambulatory Visit (HOSPITAL_COMMUNITY)
Admission: RE | Admit: 2020-02-12 | Discharge: 2020-02-12 | Disposition: A | Discharge status: Home / Self Care | Payer: Medicare Other | Source: Ambulatory Visit | Attending: Adult Health | Admitting: Adult Health

## 2020-02-12 ENCOUNTER — Other Ambulatory Visit: Payer: Self-pay

## 2020-02-12 VITALS — BP 162/80 | HR 80 | Wt 218.6 lb

## 2020-02-12 DIAGNOSIS — I447 Left bundle-branch block, unspecified: Secondary | ICD-10-CM | POA: Diagnosis not present

## 2020-02-12 DIAGNOSIS — Z8673 Personal history of transient ischemic attack (TIA), and cerebral infarction without residual deficits: Secondary | ICD-10-CM | POA: Diagnosis not present

## 2020-02-12 DIAGNOSIS — I10 Essential (primary) hypertension: Secondary | ICD-10-CM | POA: Diagnosis not present

## 2020-02-12 DIAGNOSIS — Z881 Allergy status to other antibiotic agents status: Secondary | ICD-10-CM | POA: Insufficient documentation

## 2020-02-12 DIAGNOSIS — G4733 Obstructive sleep apnea (adult) (pediatric): Secondary | ICD-10-CM | POA: Insufficient documentation

## 2020-02-12 DIAGNOSIS — Z79899 Other long term (current) drug therapy: Secondary | ICD-10-CM | POA: Diagnosis not present

## 2020-02-12 DIAGNOSIS — Z7951 Long term (current) use of inhaled steroids: Secondary | ICD-10-CM | POA: Diagnosis not present

## 2020-02-12 DIAGNOSIS — Z8249 Family history of ischemic heart disease and other diseases of the circulatory system: Secondary | ICD-10-CM | POA: Insufficient documentation

## 2020-02-12 DIAGNOSIS — E1122 Type 2 diabetes mellitus with diabetic chronic kidney disease: Secondary | ICD-10-CM | POA: Diagnosis not present

## 2020-02-12 DIAGNOSIS — Z955 Presence of coronary angioplasty implant and graft: Secondary | ICD-10-CM | POA: Diagnosis not present

## 2020-02-12 DIAGNOSIS — K3184 Gastroparesis: Secondary | ICD-10-CM | POA: Diagnosis not present

## 2020-02-12 DIAGNOSIS — I080 Rheumatic disorders of both mitral and aortic valves: Secondary | ICD-10-CM | POA: Insufficient documentation

## 2020-02-12 DIAGNOSIS — I25119 Atherosclerotic heart disease of native coronary artery with unspecified angina pectoris: Secondary | ICD-10-CM

## 2020-02-12 DIAGNOSIS — K219 Gastro-esophageal reflux disease without esophagitis: Secondary | ICD-10-CM | POA: Diagnosis not present

## 2020-02-12 DIAGNOSIS — Z794 Long term (current) use of insulin: Secondary | ICD-10-CM | POA: Insufficient documentation

## 2020-02-12 DIAGNOSIS — I480 Paroxysmal atrial fibrillation: Secondary | ICD-10-CM | POA: Diagnosis not present

## 2020-02-12 DIAGNOSIS — Z91048 Other nonmedicinal substance allergy status: Secondary | ICD-10-CM | POA: Insufficient documentation

## 2020-02-12 DIAGNOSIS — Z6839 Body mass index (BMI) 39.0-39.9, adult: Secondary | ICD-10-CM | POA: Diagnosis not present

## 2020-02-12 DIAGNOSIS — Z833 Family history of diabetes mellitus: Secondary | ICD-10-CM | POA: Insufficient documentation

## 2020-02-12 DIAGNOSIS — I130 Hypertensive heart and chronic kidney disease with heart failure and stage 1 through stage 4 chronic kidney disease, or unspecified chronic kidney disease: Secondary | ICD-10-CM | POA: Insufficient documentation

## 2020-02-12 DIAGNOSIS — Z7901 Long term (current) use of anticoagulants: Secondary | ICD-10-CM | POA: Insufficient documentation

## 2020-02-12 DIAGNOSIS — E1143 Type 2 diabetes mellitus with diabetic autonomic (poly)neuropathy: Secondary | ICD-10-CM | POA: Insufficient documentation

## 2020-02-12 DIAGNOSIS — Z880 Allergy status to penicillin: Secondary | ICD-10-CM | POA: Insufficient documentation

## 2020-02-12 DIAGNOSIS — N189 Chronic kidney disease, unspecified: Secondary | ICD-10-CM | POA: Diagnosis not present

## 2020-02-12 DIAGNOSIS — G4089 Other seizures: Secondary | ICD-10-CM | POA: Diagnosis not present

## 2020-02-12 DIAGNOSIS — I2510 Atherosclerotic heart disease of native coronary artery without angina pectoris: Secondary | ICD-10-CM | POA: Insufficient documentation

## 2020-02-12 DIAGNOSIS — Z809 Family history of malignant neoplasm, unspecified: Secondary | ICD-10-CM | POA: Insufficient documentation

## 2020-02-12 DIAGNOSIS — Z888 Allergy status to other drugs, medicaments and biological substances status: Secondary | ICD-10-CM | POA: Insufficient documentation

## 2020-02-12 DIAGNOSIS — Z7902 Long term (current) use of antithrombotics/antiplatelets: Secondary | ICD-10-CM | POA: Insufficient documentation

## 2020-02-12 DIAGNOSIS — I5022 Chronic systolic (congestive) heart failure: Secondary | ICD-10-CM | POA: Diagnosis not present

## 2020-02-12 DIAGNOSIS — Z951 Presence of aortocoronary bypass graft: Secondary | ICD-10-CM | POA: Diagnosis not present

## 2020-02-12 DIAGNOSIS — Z8349 Family history of other endocrine, nutritional and metabolic diseases: Secondary | ICD-10-CM | POA: Insufficient documentation

## 2020-02-12 LAB — BASIC METABOLIC PANEL
Anion gap: 10 (ref 5–15)
BUN: 12 mg/dL (ref 8–23)
CO2: 25 mmol/L (ref 22–32)
Calcium: 9 mg/dL (ref 8.9–10.3)
Chloride: 102 mmol/L (ref 98–111)
Creatinine, Ser: 1.01 mg/dL — ABNORMAL HIGH (ref 0.44–1.00)
GFR, Estimated: 59 mL/min — ABNORMAL LOW (ref >60)
Glucose, Bld: 154 mg/dL — ABNORMAL HIGH (ref 70–99)
Potassium: 3.6 mmol/L (ref 3.5–5.1)
Sodium: 137 mmol/L (ref 135–145)

## 2020-02-12 MED ORDER — LOSARTAN POTASSIUM 25 MG PO TABS: 25.0000 mg | ORAL_TABLET | Freq: Every day | ORAL | 3 refills | Status: DC

## 2020-02-12 NOTE — Patient Instructions (Signed)
INCREASE Losartan to 25 mg ,one tab daily  Labs today We will only contact you if something comes back abnormal or we need to make some changes. Otherwise no news is good news!  Labs needed in 7-10 days  Your physician recommends that you schedule a follow-up appointment in: 3 months with Dr [name]   Do the following things EVERYDAY: 1) Weigh yourself in the morning before breakfast. Write it down and keep it in a log. 2) Take your medicines as prescribed 3) Eat low salt foods-Limit salt (sodium) to 2000 mg per day.  4) Stay as active as you can everyday 5) Limit all fluids for the day to less than 2 liters

## 2020-02-21 ENCOUNTER — Other Ambulatory Visit (HOSPITAL_COMMUNITY): Payer: Self-pay | Admitting: Cardiology

## 2020-02-25 ENCOUNTER — Other Ambulatory Visit (HOSPITAL_COMMUNITY): Payer: Medicare Other

## 2020-02-27 ENCOUNTER — Other Ambulatory Visit: Payer: Self-pay | Admitting: Family Medicine

## 2020-02-28 ENCOUNTER — Other Ambulatory Visit (HOSPITAL_COMMUNITY): Payer: Medicare Other

## 2020-02-28 LAB — SARS CORONAVIRUS 2 (TAT 6-24 HRS): SARS Coronavirus 2: POSITIVE — AB

## 2020-02-29 ENCOUNTER — Ambulatory Visit (HOSPITAL_COMMUNITY)
Admission: RE | Admit: 2020-02-29 | Discharge: 2020-02-29 | Disposition: A | Discharge status: Home / Self Care | Payer: Medicare Other | Source: Ambulatory Visit | Attending: Pulmonary Disease | Admitting: Pulmonary Disease

## 2020-02-29 ENCOUNTER — Other Ambulatory Visit: Payer: Self-pay | Admitting: Physician Assistant

## 2020-02-29 DIAGNOSIS — Z8673 Personal history of transient ischemic attack (TIA), and cerebral infarction without residual deficits: Secondary | ICD-10-CM | POA: Insufficient documentation

## 2020-02-29 DIAGNOSIS — U071 COVID-19: Secondary | ICD-10-CM | POA: Insufficient documentation

## 2020-02-29 DIAGNOSIS — I5022 Chronic systolic (congestive) heart failure: Secondary | ICD-10-CM | POA: Diagnosis not present

## 2020-02-29 DIAGNOSIS — Z794 Long term (current) use of insulin: Secondary | ICD-10-CM | POA: Insufficient documentation

## 2020-02-29 DIAGNOSIS — I25708 Atherosclerosis of coronary artery bypass graft(s), unspecified, with other forms of angina pectoris: Secondary | ICD-10-CM | POA: Insufficient documentation

## 2020-02-29 DIAGNOSIS — E1129 Type 2 diabetes mellitus with other diabetic kidney complication: Secondary | ICD-10-CM | POA: Insufficient documentation

## 2020-02-29 DIAGNOSIS — R54 Age-related physical debility: Secondary | ICD-10-CM | POA: Diagnosis not present

## 2020-02-29 DIAGNOSIS — I480 Paroxysmal atrial fibrillation: Secondary | ICD-10-CM | POA: Diagnosis not present

## 2020-02-29 MED ORDER — DIPHENHYDRAMINE HCL 50 MG/ML IJ SOLN: 50.0000 mg | Freq: Once | INTRAMUSCULAR | Status: DC | PRN

## 2020-02-29 MED ORDER — EPINEPHRINE 0.3 MG/0.3ML IJ SOAJ: 0.3000 mg | Freq: Once | INTRAMUSCULAR | Status: DC | PRN

## 2020-02-29 MED ORDER — FAMOTIDINE IN NACL 20-0.9 MG/50ML-% IV SOLN: 20.0000 mg | Freq: Once | INTRAVENOUS | Status: DC | PRN

## 2020-02-29 MED ORDER — SOTROVIMAB 500 MG/8ML IV SOLN
500.0000 mg | Freq: Once | INTRAVENOUS | Status: AC
  Administered 2020-02-29: 500 mg via INTRAVENOUS

## 2020-02-29 MED ORDER — SODIUM CHLORIDE 0.9 % IV SOLN: Freq: Once | INTRAVENOUS | Status: AC

## 2020-02-29 MED ORDER — ALBUTEROL SULFATE HFA 108 (90 BASE) MCG/ACT IN AERS: 2.0000 | INHALATION_SPRAY | Freq: Once | RESPIRATORY_TRACT | Status: DC | PRN

## 2020-02-29 MED ORDER — SODIUM CHLORIDE 0.9 % IV SOLN: INTRAVENOUS | Status: DC | PRN

## 2020-02-29 MED ORDER — METHYLPREDNISOLONE SODIUM SUCC 125 MG IJ SOLR: 125.0000 mg | Freq: Once | INTRAMUSCULAR | Status: DC | PRN

## 2020-02-29 NOTE — Progress Notes (Signed)
Diagnosis: COVID-19  Physician: Dr. Patrick Wright  Procedure: [facility]fusion [facility] Med: Sotrovimab infusion - Provided patient with sotrovimab fact sheet for patients, parents, and caregivers prior to infusion.   Complications: No immediate complications noted  Discharge: Discharged home    

## 2020-02-29 NOTE — Progress Notes (Signed)
I connected by phone with [name] on 02/29/2020 at 9:55 AM to discuss the potential use of a new treatment for mild to moderate COVID-19 viral infection in non-hospitalized patients.  This patient is a 75 y.o. female that meets the FDA criteria for Emergency Use Authorization of COVID monoclonal antibody sotrovimab.  Has a (+) direct SARS-CoV-2 viral test result  Has mild or moderate COVID-19   Is NOT hospitalized due to COVID-19  Is within 10 days of symptom onset  Has at least one of the high risk factor(s) for progression to severe COVID-19 and/or hospitalization as defined in EUA.  Specific high risk criteria : Older age (>/= 73 yo), BMI > 25, Diabetes and Cardiovascular disease or hypertension   I have spoken and communicated the following to the patient or parent/caregiver regarding COVID monoclonal antibody treatment:  1. FDA has authorized the emergency use for the treatment of mild to moderate COVID-19 in adults and pediatric patients with positive results of direct SARS-CoV-2 viral testing who are 94 years of age and older weighing at least 40 kg, and who are at high risk for progressing to severe COVID-19 and/or hospitalization.  2. The significant known and potential risks and benefits of COVID monoclonal antibody, and the extent to which such potential risks and benefits are unknown.  3. Information on available alternative treatments and the risks and benefits of those alternatives, including clinical trials.  4. Patients treated with COVID monoclonal antibody should continue to self-isolate and use infection control measures (e.g., wear mask, isolate, social distance, avoid sharing personal items, clean and disinfect "high touch" surfaces, and frequent handwashing) according to CDC guidelines.   5. The patient or parent/caregiver has the option to accept or refuse COVID monoclonal antibody treatment.  After reviewing this information with the patient, the patient has  agreed to receive one of the available covid 19 monoclonal antibodies and will be provided an appropriate fact sheet prior to infusion.   Sx onset 1/22. Set up for infusion today. Vaccinated but very recently boosted and infected shortly after.   Angelena Form, PA-C 02/29/2020 9:55 AM

## 2020-02-29 NOTE — Discharge Instructions (Signed)
10 Things You Can Do to Manage Your COVID-19 Symptoms at Home °If you have possible or confirmed COVID-19: °1. Stay home except to get medical care. °2. Monitor your symptoms carefully. If your symptoms get worse, call your healthcare provider immediately. °3. Get rest and stay hydrated. °4. If you have a medical appointment, call the healthcare provider ahead of time and tell them that you have or may have COVID-19. °5. For medical emergencies, call 911 and notify the dispatch personnel that you have or may have COVID-19. °6. Cover your cough and sneezes with a tissue or use the inside of your elbow. °7. Wash your hands often with soap and water for at least 20 seconds or clean your hands with an alcohol-based hand sanitizer that contains at least 60% alcohol. °8. As much as possible, stay in a specific room and away from other people in your home. Also, you should use a separate bathroom, if available. If you need to be around other people in or outside of the home, wear a mask. °9. Avoid sharing personal items with other people in your household, like dishes, towels, and bedding. °10. Clean all surfaces that are touched often, like counters, tabletops, and doorknobs. Use household cleaning sprays or wipes according to the label instructions. °cdc.gov/coronavirus °08/17/2019 °This information is not intended to replace advice given to you by your health care provider. Make sure you discuss any questions you have with your health care provider. °Document Revised: 12/03/2019 Document Reviewed: 12/03/2019 °Elsevier Patient Education © 2021 Elsevier Inc. ° ° ° °What types of side effects do monoclonal antibody drugs cause?  °Common side effects ° °In general, the more common side effects caused by monoclonal antibody drugs include: °• Allergic reactions, such as hives or itching °• Flu-like signs and symptoms, including chills, fatigue, fever, and muscle aches and pains °• Nausea, vomiting °• Diarrhea °• Skin  rashes °• Low blood pressure ° ° °The CDC is recommending patients who receive monoclonal antibody treatments wait at least 90 days before being vaccinated. ° °Currently, there are no data on the safety and efficacy of mRNA COVID-19 vaccines in persons who received monoclonal antibodies or convalescent plasma as part of COVID-19 treatment. Based on the estimated half-life of such therapies as well as evidence suggesting that reinfection is uncommon in the 90 days after initial infection, vaccination should be deferred for at least 90 days, as a precautionary measure until additional information becomes available, to avoid interference of the antibody treatment with vaccine-induced immune responses. ° ° ° °If you have any questions or concerns after the infusion please call the Advanced Practice Provider on call at [phone]. This number is ONLY intended for your use regarding questions or concerns about the infusion post-treatment side-effects.  Please do not provide this number to others for use. For return to work notes please contact your primary care provider.  ° °If someone you know is interested in receiving treatment please have them contact their MD for a referral or visit [email or web address] ° ° °

## 2020-02-29 NOTE — Progress Notes (Signed)
Patient reviewed Fact Sheet for Patients, Parents, and Caregivers for Emergency Use Authorization (EUA) of sotrovimab for the Treatment of Coronavirus. Patient also reviewed and is agreeable to the estimated cost of treatment. Patient is agreeable to proceed.   

## 2020-03-03 ENCOUNTER — Other Ambulatory Visit (HOSPITAL_COMMUNITY): Payer: Medicare Other

## 2020-03-14 ENCOUNTER — Other Ambulatory Visit: Payer: Self-pay

## 2020-03-14 ENCOUNTER — Ambulatory Visit (HOSPITAL_COMMUNITY)
Admission: RE | Admit: 2020-03-14 | Discharge: 2020-03-14 | Disposition: A | Discharge status: Home / Self Care | Payer: Medicare Other | Source: Ambulatory Visit | Attending: Cardiology | Admitting: Cardiology

## 2020-03-14 DIAGNOSIS — I5022 Chronic systolic (congestive) heart failure: Secondary | ICD-10-CM | POA: Diagnosis present

## 2020-03-14 LAB — BASIC METABOLIC PANEL
Anion gap: 11 (ref 5–15)
BUN: 11 mg/dL (ref 8–23)
CO2: 27 mmol/L (ref 22–32)
Calcium: 9.2 mg/dL (ref 8.9–10.3)
Chloride: 100 mmol/L (ref 98–111)
Creatinine, Ser: 1.13 mg/dL — ABNORMAL HIGH (ref 0.44–1.00)
GFR, Estimated: 52 mL/min — ABNORMAL LOW (ref >60)
Glucose, Bld: 215 mg/dL — ABNORMAL HIGH (ref 70–99)
Potassium: 4.2 mmol/L (ref 3.5–5.1)
Sodium: 138 mmol/L (ref 135–145)

## 2020-03-21 ENCOUNTER — Other Ambulatory Visit (HOSPITAL_COMMUNITY): Payer: Self-pay | Admitting: Cardiology

## 2020-05-13 ENCOUNTER — Encounter (HOSPITAL_COMMUNITY): Payer: Self-pay | Admitting: Cardiology

## 2020-05-13 ENCOUNTER — Ambulatory Visit (HOSPITAL_COMMUNITY)
Admission: RE | Admit: 2020-05-13 | Discharge: 2020-05-13 | Disposition: A | Discharge status: Home / Self Care | Payer: Medicare Other | Source: Ambulatory Visit | Attending: Cardiology | Admitting: Cardiology

## 2020-05-13 ENCOUNTER — Other Ambulatory Visit (HOSPITAL_COMMUNITY): Payer: Self-pay

## 2020-05-13 ENCOUNTER — Other Ambulatory Visit: Payer: Self-pay

## 2020-05-13 VITALS — BP 144/78 | HR 61 | Wt 217.6 lb

## 2020-05-13 DIAGNOSIS — Z7902 Long term (current) use of antithrombotics/antiplatelets: Secondary | ICD-10-CM | POA: Diagnosis not present

## 2020-05-13 DIAGNOSIS — I5042 Chronic combined systolic (congestive) and diastolic (congestive) heart failure: Secondary | ICD-10-CM | POA: Diagnosis present

## 2020-05-13 DIAGNOSIS — Z7951 Long term (current) use of inhaled steroids: Secondary | ICD-10-CM | POA: Insufficient documentation

## 2020-05-13 DIAGNOSIS — Z7901 Long term (current) use of anticoagulants: Secondary | ICD-10-CM | POA: Insufficient documentation

## 2020-05-13 DIAGNOSIS — E785 Hyperlipidemia, unspecified: Secondary | ICD-10-CM

## 2020-05-13 DIAGNOSIS — I2510 Atherosclerotic heart disease of native coronary artery without angina pectoris: Secondary | ICD-10-CM | POA: Diagnosis not present

## 2020-05-13 DIAGNOSIS — Z79899 Other long term (current) drug therapy: Secondary | ICD-10-CM | POA: Diagnosis not present

## 2020-05-13 DIAGNOSIS — E1122 Type 2 diabetes mellitus with diabetic chronic kidney disease: Secondary | ICD-10-CM | POA: Diagnosis not present

## 2020-05-13 DIAGNOSIS — Z8673 Personal history of transient ischemic attack (TIA), and cerebral infarction without residual deficits: Secondary | ICD-10-CM | POA: Diagnosis not present

## 2020-05-13 DIAGNOSIS — I130 Hypertensive heart and chronic kidney disease with heart failure and stage 1 through stage 4 chronic kidney disease, or unspecified chronic kidney disease: Secondary | ICD-10-CM | POA: Diagnosis not present

## 2020-05-13 DIAGNOSIS — G4733 Obstructive sleep apnea (adult) (pediatric): Secondary | ICD-10-CM | POA: Diagnosis not present

## 2020-05-13 DIAGNOSIS — Z955 Presence of coronary angioplasty implant and graft: Secondary | ICD-10-CM | POA: Diagnosis not present

## 2020-05-13 DIAGNOSIS — I447 Left bundle-branch block, unspecified: Secondary | ICD-10-CM | POA: Diagnosis not present

## 2020-05-13 DIAGNOSIS — I081 Rheumatic disorders of both mitral and tricuspid valves: Secondary | ICD-10-CM | POA: Diagnosis not present

## 2020-05-13 DIAGNOSIS — Z794 Long term (current) use of insulin: Secondary | ICD-10-CM | POA: Diagnosis not present

## 2020-05-13 DIAGNOSIS — Z8249 Family history of ischemic heart disease and other diseases of the circulatory system: Secondary | ICD-10-CM | POA: Diagnosis not present

## 2020-05-13 DIAGNOSIS — I480 Paroxysmal atrial fibrillation: Secondary | ICD-10-CM | POA: Insufficient documentation

## 2020-05-13 DIAGNOSIS — N189 Chronic kidney disease, unspecified: Secondary | ICD-10-CM | POA: Insufficient documentation

## 2020-05-13 DIAGNOSIS — Z951 Presence of aortocoronary bypass graft: Secondary | ICD-10-CM | POA: Insufficient documentation

## 2020-05-13 DIAGNOSIS — Z6839 Body mass index (BMI) 39.0-39.9, adult: Secondary | ICD-10-CM | POA: Diagnosis not present

## 2020-05-13 LAB — CBC
HCT: 42.3 % (ref 36.0–46.0)
Hemoglobin: 13.6 g/dL (ref 12.0–15.0)
MCH: 31.3 pg (ref 26.0–34.0)
MCHC: 32.2 g/dL (ref 30.0–36.0)
MCV: 97.5 fL (ref 80.0–100.0)
Platelets: 199 10*3/uL (ref 150–400)
RBC: 4.34 MIL/uL (ref 3.87–5.11)
RDW: 14.2 % (ref 11.5–15.5)
WBC: 9 10*3/uL (ref 4.0–10.5)
nRBC: 0 % (ref 0.0–0.2)

## 2020-05-13 LAB — BASIC METABOLIC PANEL
Anion gap: 8 (ref 5–15)
BUN: 18 mg/dL (ref 8–23)
CO2: 30 mmol/L (ref 22–32)
Calcium: 8.8 mg/dL — ABNORMAL LOW (ref 8.9–10.3)
Chloride: 100 mmol/L (ref 98–111)
Creatinine, Ser: 1.01 mg/dL — ABNORMAL HIGH (ref 0.44–1.00)
GFR, Estimated: 59 mL/min — ABNORMAL LOW (ref >60)
Glucose, Bld: 140 mg/dL — ABNORMAL HIGH (ref 70–99)
Potassium: 4.2 mmol/L (ref 3.5–5.1)
Sodium: 138 mmol/L (ref 135–145)

## 2020-05-13 LAB — LIPID PANEL
Cholesterol: 121 mg/dL (ref 0–200)
HDL: 51 mg/dL (ref >40)
LDL Cholesterol: 58 mg/dL (ref 0–99)
Total CHOL/HDL Ratio: 2.4 RATIO
Triglycerides: 58 mg/dL (ref <150)
VLDL: 12 mg/dL (ref 0–40)

## 2020-05-13 MED ORDER — NITROGLYCERIN 0.3 MG SL SUBL: 0.3000 mg | SUBLINGUAL_TABLET | SUBLINGUAL | 1 refills | Status: DC | PRN

## 2020-05-13 MED ORDER — ENTRESTO 24-26 MG PO TABS: 1.0000 | ORAL_TABLET | Freq: Two times a day (BID) | ORAL | 3 refills | Status: DC

## 2020-05-13 NOTE — Patient Instructions (Addendum)
Stop Losartan  Start Entresto 24/26 mg Twice daily   **Please call the [place] at [phone] for help using your device**  Labs done today, your results will be available in MyChart, we will contact you for abnormal readings.  Your physician has requested that you have an echocardiogram. Echocardiography is a painless test that uses sound waves to create images of your heart. It provides your doctor with information about the size and shape of your heart and how well your heart's chambers and valves are working. This procedure takes approximately one hour. There are no restrictions for this procedure.  Please follow up with our heart failure pharmacist in 4 weeks  Your physician recommends that you schedule a follow-up appointment in: 3 months  If you have any questions or concerns before your next appointment please send [place] a message through [place] or call our office at [phone].    TO LEAVE A MESSAGE FOR THE NURSE SELECT OPTION 2, PLEASE LEAVE A MESSAGE INCLUDING: . YOUR NAME . DATE OF BIRTH . CALL BACK NUMBER . REASON FOR CALL**this is important as we prioritize the call backs  [place] AS LONG AS YOU CALL BEFORE 4:00 PM  At the [facility], you and your health needs are our priority. As part of our continuing mission to provide you with exceptional heart care, we have created designated Provider Care Teams. These Care Teams include your primary Cardiologist (physician) and Advanced Practice Providers (APPs- Physician Assistants and Nurse Practitioners) who all work together to provide you with the care you need, when you need it.   You may see any of the following providers on your designated Care Team at your next follow up: Marland Kitchen Dr Glori Bickers . Dr Loralie Champagne . Dr Vickki Muff . Darrick Grinder, NP . Lyda Jester, [place] . Audry Riles, PharmD   Please be sure to bring in all your  medications bottles to every appointment.

## 2020-05-13 NOTE — Progress Notes (Signed)
Date:  05/13/2020   ID:  [name] [name], DOB 25-Feb-1945, [name] [id number]   Provider location: [facility] Type of Visit: Established patient   PCP:  [name] Late, MD  Cardiologist:  Dr. Aundra [name]  Chief Complaint: Exertional shortness of breath   History of Present Illness: [name] [name] is a 75 y.o. female who has a history of CAD s/p CABG, diastolic CHF, and cerebrovascular disease with history of CVA presents for followup of CHF and diastolic CHF. She had CABG x 5 in 12/11.  Prior to the CABG she had multiple PCIs.  She had a CVA in 2/14 that presented as visual loss.  She had a left carotid stent in 2/15.   Left heart cath done in Sept. 2014. This showed patent SVG-D, LIMA-LAD, and sequential SVG-OM branches but SVG-RCA and the native RCA were both totally occluded. It was felt aht her increased symptoms coincided with occlusion of SVG-RCA.  She was started on Imdur to see if this would help with the chest pain and dyspnea.  However, she feels like Imdur causes leg cramps and does not think that she can take it.  So ranolazine 500 mg bid started and titrated this up to 1000 mg bid.  This helped some but not markedly.  Therefore, sent to  Dr. Irish [name] to address opening RCA CTO. He was able to do this in 5/15 with 4 overlapping Xience DES.  This led to resolution of exertional chest pain.    [name] [name] was started on Brilinta after PCI.  She became much more short of breath after starting on Brilinta. Per Dr [name] [name] stopped and replaced with Plavix.  Dyspnea improved off Brilinta. Given increasing exertional dyspnea and chest pain,  RHC/LHC was done in 4/17.  This showed stable CAD with no interventional target.  Left and right heart filling pressures were not significantly increased.  Medical management.   She had an MRI/MRA in May 2018 that showed moderately severe stenosis of the supraclinoid segment of the right internal carotid artery, progressed since  the 2016 MR angiogram and severe basilar artery stenosis.   She developed recurrent exertional chest pain and had LHC in 6/18, showing 4/5 grafts patent with patent native RCA (similar to prior cath, no changes).    Echo 1/19 with EF 55-60%, moderate diastolic dysfunction, PASP 50 mmHg, mildly dilated RV, mild AS, mild-moderate MR.   She reported increased dyspnea and chest pain and had RHC/LHC in 9/19.  She had DES to sequential SVG-OM1/PLOM.  While hospitalized, she was noted to have runs of atrial fibrillation.  She was very symptomatic with the atrial fibrillation.  Eliquis and amiodarone were started.   She was admitted 12/16-12/19/19 for cardiomems implant and RHC. She was diuresed with IV lasix and transitioned to torsemide 100 mg BID + metolazone once/week. DC weight: 295 lbs.  She had a prolonged hospitalization in 2/20 with acute respiratory failure and fever to 103.  She was thought to have PNA and treated with cefepime.  She was intubated.  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  Amiodarone was stopped and she was put on prednisone. Echo in 2/20 showed EF down to 25-30% with mid-apical LV severe hypokinesis. She had AKI. Possible ITP triggered by infection, HIT negative.  ITP was treated with Solumedrol. She was discharged to SNF.    ORIF left ankle 4/20.   11/20 admission initially with concern for TIAs (staring spells),  ended up thinking possible partial seizures.  Head MRI did not show CVA.  Echo in 11/20 showed EF 45-50%, moderately decreased RV systolic function, moderate RV enlargement, moderate-severe MR, moderate TR.   Patient was admitted again in 12/20 with AKI and hypotension in the setting of sinus bradycardia (HR 30s).  She was volume overloaded on exam.  Toprol XL was stopped and HR increased to 60s.  She was diuresed with IV Lasix.  TEE in 12/20 showed EF 50-55%, septal-lateral dyssynchrony, mildly decreased RV function, moderate MR.    Atypical chest pain in 1/21, Cardiolite showed EF 57%, fixed inferior defect (likely artifact), no ischemia.   She returns today for followup of CHF and CAD.  Still not using Cardiomems.  Weight is stable.  Uses rolling walker for balance. Short of breath with longer walks and stairs.  Occasional palpitations.  She will get random, nonexertional chest pain several times/week.  Pain will last 2-10 minutes.  This has been going on for years.  No lightheadedness. No falls.  No orthopnea/PND.   ECG (personally reviewed): NSR, LBBB 174 msec  Labs (6/14): K 3.8, creatinine 0.71 Labs (8/14): BNP 249, LDL 166 Labs (9/14): K 4.7, creatinine 0.7 Labs (9/14): K 4.6, creatinine 0.9, BNP 109 Labs (1/15): K 4.5, creatinine 0.73, LDL 212, HCT 43.4, TSH normal, BNP 138 Labs (6/15): K 4.7, creatinine 1.17, LDL 100, HDL 37, TGs 363, BNP 39 Labs (10/15): K 4.6, creatinine 1.2 Labs (1/16): LDL 157, HDL 43, K 5.2, creatinine 0.95 Labs (2/16): K 4.5, creatinine 1.08, BNP 113 Labs (4/16): LDL 50, HDL 56, TGs 179 Labs (9/16): K 5.3, creatinine 1.09 Labs (10/16): LDL 75, HDL 56 Labs (2/17): K 5.2, creatinine 1.07 Labs (4/17): K 5.1, creatinine 1.07 Labs (5/17): K 4.5, creatinine 1.01, LDL 96, HDL 56, BNP 70 Labs (03/2016) K 4.9, creatinine 1.07 Labs (7/18): K 4.9, creatinine 1.15 Labs (11/18): K 5.1, creatinine 1.11, LDL 36 Labs (1/19): K 4.9, creatinine 1.22 Labs (9/19): LDL 108 Labs (10/19): hgb 14.5, K 4.3, creatinine 1.03 Labs 12/06/2017: K 4.7 Creatinine 1.7 Labs (3/20): K 4.8, creatinine 1.39, hgb 12.4 plts 226 Labs (11/20): K 4.2, creatinine 1.01, LDL 38 Labs (12/20): K 3.5, creatinine 1.8 Labs (3/21): K 4.5, creatinine 1.5 Labs (2/22): K 4.2, creatinine 1.13  PMH: 1. Diabetic gastroparesis 2. Type II diabetes 3. HTN 4. Morbid obesity 5. CAD: s/p CABG in 12/11 after prior PCIs.  LIMA-LAD, SVG-D, seq SVG-OM1 and OM2, SVG-PDA. Adenosine Cardiolite (8/14) with EF 53% and a small reversible  apical defect with a medium, partially reversible inferior defect.  LHC (9/14) with patent SVG-D, LIMA-LAD (50% distal LAD), and sequential SVG-OM branches; the SVG-RCA and the native RCA were occluded.  This was managed medically initially, but with ongoing exertional chest pain, it was decided to open CTO.  Patient had CTO opening with 4 overlapping Xience DES in the RCA in 5/15.   - LHC (4/17): SVG-D patent, LIMA-LAD patent, distal LAD with several 40-50% stenoses, sequential SVG-OM1 and PLOM patent with 50% proximal stenosis (not flow limiting), SVG-RCA TO with patent RCA stents.  - LHC (6/18): 4/5 grafts patent (SVG-RCA TO, same as past), RCA stents patent => no change.  - LHC (9/19): Sequential SVG-PLOM/OM1 with 80% proximal stenosis, s/p DES.  - Cardiolite (1/21): EF 57%, fixed inferior defect (likely artifact), no ischemia.  6. Atypical migraines 7. OHS/OSA: Intolerant of CPAP. Uses oxygen with exertion and at night.  8. GERD with hiatal hernia 9. OA 10. Chronic systolic  CHF: Echo (11/13) with EF 50-55%, grade II diastolic dysfunction, mild-moderate MR.  Echo (8/14) with EF 55-60%, grade II diastolic dysfunction, mildly increased aortic valve gradient (mean 12 mmHg) but valve opens well, mild MR and mild RV dilation.  Echo (9/15) with EF 60-65%, grade II diastolic dysfunction, mild aortic stenosis, mild mitral stenosis, mild to moderate mitral regurgitation, mildly dilated RV with normal systolic function, PA systolic pressure 46 mmHg.  - RHC (4/17): mean RA 9, PA 38/15 mean 27, mean PCWP 9, CI 2.5.  - Echo (5/17): EF 55-60%, mild LVH, mildly dilated RV with low normal systolic function, moderate TR, PASP 61 mmHg - Echo (1/19): EF 55-60%, moderate diastolic dysfunction, PASP 50 mmHg, mildly dilated RV, mild AS, mild-moderate MR. - RHC (9/19): mean RA 11, PA 34/12, mean PCWP 15, CI 2.85 - Echo (3/20): EF 25-30% with mid-apical severe hypokinesis.  - Echo (11/20): EF 45-50%, moderately dilated  RV with moderately decreased systolic function, moderate-severe MR, moderate TR.  - TEE (12/20): EF 50-55%, septal-lateral dyssynchrony, moderate RV dilation/mildly decreased function, peak RV-RA gradient 51 mmHg, moderate MR.  11. CKD 12. Chronic LBBB 13. Anxiety 14. Carotid stenosis: Followed by [facility], >96% LICA stenosis 75/91.  She had left carotid stent in 2/15. Carotid dopplers 8/15 with no significant disease. Carotid dopplers (4/16): < 40% RICA stenosis.  15. Cerebrovascular disease: CVA 2/14 with right posterior cerebral artery territory ischemic infarction. Cerebral angiogram in 6/14 showed 70% right vertebral ostial stenosis, 63-84% LICA stenosis, > 66% proximal left posterior cerebral artery stenosis, posterior communicating artery aneurysm.  Patient has had episodes of transient expressive aphasia.  Carotid dopplers (59/93) showed >57% LICA stenosis. She had a left carotid stent 2/15.  Possible CVA in 7/15. -  MRI/MRA in May 2018 that showed moderately severe stenosis of the supraclinoid segment of the right internal carotid artery, progressed since the 2016 MR angiogram and severe basilar artery stenosis.  16. Positional vertigo (suspected) 17. Palpitations: Holter (6/15) with rare PVCs/PACs.  - Event monitor (2/18): NSR, occasional PVCs 18. Dyspnea with Brilinta. 19. Melanoma: On face, s/p excision.   20. Aortic stenosis: Mild.  21. Holter (8/16) with no significant arrhythmias.  22. Lower extremity arterial dopplers (9/16) were normal.  23. Sleep study (4/18): No significant OSA.  24. Atrial fibrillation: Paroxysmal - Amiodarone stopped, ?lung toxicity.  25. H/o ITP in 3/20.  16. Partial seizures 17. Mitral regurgitation: Moderate on TEE in 12/20.    Current Outpatient Medications  Medication Sig Dispense Refill  . acetaminophen (TYLENOL) 500 MG tablet Take 500 mg by mouth every 6 (six) hours as needed for mild pain, moderate pain, fever or headache.    . albuterol (VENTOLIN  HFA) 108 (90 Base) MCG/ACT inhaler Inhale 2 puffs into the lungs every 4 (four) hours as needed for wheezing or shortness of breath.     . Alirocumab (PRALUENT) 150 MG/ML SOAJ Inject 150 mg/mL into the skin every 14 (fourteen) days.    . ALPRAZolam (XANAX) 0.5 MG tablet Take 1-2 tablets (0.5-1 mg total) by mouth See admin instructions. Take one tablet (0.5 mg) by mouth every morning and two tablets (1 mg) at night, may also take 1 tablet (0.5 mg) midday as needed for anxiety    . amLODipine (NORVASC) 10 MG tablet TAKE ONE (1) TABLET BY MOUTH EACH DAY 30 tablet 5  . apixaban (ELIQUIS) 5 MG TABS tablet Take 1 tablet (5 mg total) by mouth 2 (two) times daily. 180 tablet 3  . B  Complex-C (B-COMPLEX WITH VITAMIN C) tablet Take 1 tablet by mouth daily.    . bisacodyl (DULCOLAX) 5 MG EC tablet Take 5 mg by mouth daily as needed for moderate constipation.    Marland Kitchen buPROPion (WELLBUTRIN XL) 150 MG 24 hr tablet Take 150 mg by mouth every morning.    . cholecalciferol (VITAMIN D3) 25 MCG (1000 UNIT) tablet Take 1,000 Units by mouth daily.    . clopidogrel (PLAVIX) 75 MG tablet TAKE ONE TABLET BY MOUTH AT BEDTIME 30 tablet 3  . denosumab (PROLIA) 60 MG/ML SOSY injection Inject 60 mg into the skin every 6 (six) months.     . DULoxetine (CYMBALTA) 30 MG capsule Take 1 capsule (30 mg total) by mouth daily. 30 capsule 0  . ezetimibe (ZETIA) 10 MG tablet Take 1 tablet (10 mg total) by mouth at bedtime.    . Fluticasone-Salmeterol (ADVAIR) 250-50 MCG/DOSE AEPB Inhale 1 puff into the lungs daily.    Marland Kitchen gabapentin (NEURONTIN) 600 MG tablet Take 600 mg by mouth 4 (four) times daily as needed.    . Insulin Glargine (LANTUS SOLOSTAR) 100 UNIT/ML Solostar Pen Inject 38-44 Units into the skin See admin instructions. Inject 44 units subcutaneously daily at bedtime, increase to 44 units for CBG >200    . insulin lispro (HUMALOG) 100 UNIT/ML KwikPen Inject 14-20 Units into the skin See admin instructions. Inject 14 units  subcutaneously prior to breakfast and supper; add 4 units for CBG >200    . ipratropium-albuterol (DUONEB) 0.5-2.5 (3) MG/3ML SOLN Take 3 mLs by nebulization 2 (two) times daily as needed (asthma).    . methocarbamol (ROBAXIN) 500 MG tablet Take 500 mg by mouth 3 (three) times daily as needed.    . Multiple Vitamin (MULTIVITAMIN WITH MINERALS) TABS tablet Take 1 tablet by mouth every morning. Centrum - Women over 44    . Multiple Vitamins-Minerals (OCUVITE EYE HEALTH FORMULA) CAPS Take 1 capsule by mouth every morning.    . ondansetron (ZOFRAN-ODT) 4 MG disintegrating tablet Take 4 mg by mouth 2 (two) times daily as needed for nausea or vomiting.    . OXYGEN Inhale 2 L into the lungs at bedtime as needed (shortness of breath).     . pantoprazole (PROTONIX) 40 MG tablet Take 1 tablet (40 mg total) by mouth daily. 30 tablet 0  . Polyvinyl Alcohol-Povidone (REFRESH OP) Place 1 drop into both eyes daily as needed (redness/ dry eyes).    . potassium chloride SA (KLOR-CON) 20 MEQ tablet TAKE 2 TABLETS BY MOUTH EVERY MORNING AND 1 TABLET EVERY EVENING 90 tablet 3  . Pyridoxine HCl (VITAMIN B-6 PO) Take 1 tablet by mouth every morning.    . ranolazine (RANEXA) 1000 MG SR tablet TAKE ONE TABLET BY MOUTH TWICE DAILY 180 tablet 1  . rosuvastatin (CRESTOR) 40 MG tablet Take 1 tablet (40 mg total) by mouth at bedtime. 30 tablet 0  . sacubitril-valsartan (ENTRESTO) 24-26 MG Take 1 tablet by mouth 2 (two) times daily. 60 tablet 3  . spironolactone (ALDACTONE) 25 MG tablet TAKE 1 TABLET BY MOUTH DAILY 30 tablet 6  . torsemide (DEMADEX) 20 MG tablet Take 4 tablets (80 mg total) by mouth 2 (two) times daily. 240 tablet 2  . traZODone (DESYREL) 50 MG tablet Take 1 tablet (50 mg total) by mouth at bedtime. 90 tablet 1  . triamcinolone cream (KENALOG) 0.1 % Apply 1 application topically daily as needed (crusty spots on arms).    . vitamin B-12 (CYANOCOBALAMIN) 1000  MCG tablet Take 1,000 mcg by mouth every morning.      . zolpidem (AMBIEN) 10 MG tablet Take 0.5 tablets (5 mg total) by mouth at bedtime. 30 tablet 0  . nitroGLYCERIN (NITROSTAT) 0.3 MG SL tablet Place 1 tablet (0.3 mg total) under the tongue every 5 (five) minutes x 3 doses as needed for chest pain. 25 tablet 1   No current facility-administered medications for this encounter.    Allergies:   Amoxicillin, Brilinta [ticagrelor], Erythromycin, Flagyl [metronidazole], Penicillins, Isosorbide mononitrate [isosorbide nitrate], Jardiance [empagliflozin], Metformin and related, Tape, and Erythromycin base   Social History:  The patient  reports that she has never smoked. She has never used smokeless tobacco. She reports that she does not drink alcohol and does not use drugs.   Family History:  The patient's family history includes AAA (abdominal aortic aneurysm) in her father and mother; Cancer in her sister; Deep vein thrombosis in her father and mother; Diabetes in her father, paternal aunt, paternal grandmother, paternal uncle, and paternal uncle; Heart attack in her father; Heart attack (age of onset: 35) in her paternal grandfather; Heart disease in her father and paternal uncle; Hyperlipidemia in her father; Hypertension in her father, mother, and sister.   ROS:  Please see the history of present illness.   All other systems are personally reviewed and negative.   Exam:   BP (!) 144/78   Pulse 61   Wt 98.7 kg (217 lb 9.6 oz)   SpO2 96%   BMI 39.80 kg/m  General: NAD Neck: JVP 8 cm with HJR, no thyromegaly or thyroid nodule.  Lungs: Clear to auscultation bilaterally with normal respiratory effort. CV: Nondisplaced PMI.  Heart regular S1/S2, no S3/S4, 2/6 HSM LLSB.  Trace ankle edema.  No carotid bruit.  Normal pedal pulses.  Abdomen: Soft, nontender, no hepatosplenomegaly, no distention.  Skin: Intact without lesions or rashes.  Neurologic: Alert and oriented x 3.  Psych: Normal affect. Extremities: No clubbing or cyanosis.  HEENT:  Normal.   Recent Labs: 09/11/2019: B Natriuretic Peptide 125.4 05/13/2020: BUN 18; Creatinine, Ser 1.01; Hemoglobin 13.6; Platelets 199; Potassium 4.2; Sodium 138  Personally reviewed   Wt Readings from Last 3 Encounters:  05/13/20 98.7 kg (217 lb 9.6 oz)  02/12/20 99.2 kg (218 lb 9.6 oz)  11/20/19 102.6 kg (226 lb 3.2 oz)     ASSESSMENT AND PLAN:  1. CAD: Occluded native RCA and SVG-RCA. Status post opening of CTO RCA with 4 overlapping Xience DES in 5/15. DES to sequential SVG-OM1/PLOM in 9/19.  She has been on Plavix and Eliquis.  She has atypical chest pain, no change to pattern.  No ischemia on 1/21 Cardiolite.  - Continuing on Plavix (neuro has wanted her to stay on this for cerebrovascular disease).   - She has not tolerated Imdur in the past. Off Toprol XL with bradycardia.  Continue ranolazine 1000 mg bid.  2. Chronic systolic => diastolic CHF: Echo in 8/54 with EF 55-60%, moderate diastolic dysfunction. Cardiomems placed 01/16/18.  Echo in 3/20 in setting of severe PNA with intubation showed EF 25-30%, mid-apical LV severe hypokinesis.  Possible stress (Takotsubo-type) cardiomyopathy related to severe medical illness versus progression of CAD.  She has baseline chronic LBBB which could play a role in cardiomyopathy as well (LBBB cardiomyopathy).  Repeat echo in 11/20 with EF up to 45-50%, moderately dysfunctional RV.  TEE in 12/20 with EF 50-55% with septal-lateral dyssynchrony and mildly dysfunctional RV.  NYHA class II-III symptoms.  Suspect mild volume overload.   - Discussed how she should use Cardiomems regularly.    - Continue torsemide 80 mg bid, BMET today.   - Continue spironolactone 25 mg daily.  - Stop losartan, start Entresto 24/26 bid.  This should allow some additional diuresis.  BMET 10 days.   3. Hyperlipidemia: She remains on Zetia, Crestor and Praluent. Check lipids today.  4. Hypertension: BP high, transitioning to [place] as above.  5. Cerebrovascular disease:  She has history of CVA and had left carotid stent in 2/15. Followed by [facility].  Most recent MRA head showed severe stenosis of supraclinoid RICA and severe basilar artery stenosis.  - Neurology would like her to continue Plavix for this despite concomitant apixaban use.  6. OHS/OSA: Cannot tolerate CPAP.  Uses oxygen with exertion and at night. No change. 7. Atrial fibrillation: Paroxysmal, no recent palpitations. 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  She is in NSR today.  - She has completed course of prednisone for amiodarone lung toxicity.    - Continue apixaban 5 mg bid.  8. Obesity: Weight has trended down over the last year.  9. Mitral regurgitation: Moderate to severe MR on last echo in 11/20 but moderate on 12/20 TEE.  No significant enough for Mitraclip.  10. Bradycardia: Now off Toprol XL.    Followup in 3 wks with pharmacist for titration of BP meds, followup 3 months NP/PA.   Signed, Loralie Champagne, MD  05/13/2020  [facility] [address] [facility] [place] [address] [phone] (office) [phone] (fax)

## 2020-05-14 ENCOUNTER — Telehealth (HOSPITAL_COMMUNITY): Payer: Self-pay | Admitting: Pharmacy Technician

## 2020-05-14 NOTE — Telephone Encounter (Signed)
Sent in [employer] application via fax.  Will follow up.  

## 2020-05-22 ENCOUNTER — Telehealth (HOSPITAL_COMMUNITY): Payer: Self-pay | Admitting: Pharmacy Technician

## 2020-05-22 NOTE — Telephone Encounter (Signed)
Advanced Heart Failure Patient Advocate Encounter   Patient was approved to receive Entresto from [employer]  Patient ID: [id number] Effective dates: 05/19/20 through 01/31/21  Called and spoke with the patient.   Charlann Boxer, CPhT

## 2020-05-23 ENCOUNTER — Ambulatory Visit (HOSPITAL_COMMUNITY): Admission: RE | Admit: 2020-05-23 | Payer: Medicare Other | Source: Ambulatory Visit

## 2020-05-23 ENCOUNTER — Encounter (HOSPITAL_COMMUNITY): Payer: Self-pay

## 2020-05-23 ENCOUNTER — Other Ambulatory Visit: Payer: Self-pay

## 2020-05-27 ENCOUNTER — Other Ambulatory Visit: Payer: Self-pay

## 2020-05-27 ENCOUNTER — Ambulatory Visit (INDEPENDENT_AMBULATORY_CARE_PROVIDER_SITE_OTHER): Payer: Medicare Other | Admitting: Neurology

## 2020-05-27 ENCOUNTER — Encounter: Payer: Self-pay | Admitting: Neurology

## 2020-05-27 VITALS — BP 194/83 | HR 69 | Ht 62.0 in | Wt 212.0 lb

## 2020-05-27 DIAGNOSIS — R269 Unspecified abnormalities of gait and mobility: Secondary | ICD-10-CM

## 2020-05-27 DIAGNOSIS — I25708 Atherosclerosis of coronary artery bypass graft(s), unspecified, with other forms of angina pectoris: Secondary | ICD-10-CM

## 2020-05-27 DIAGNOSIS — I679 Cerebrovascular disease, unspecified: Secondary | ICD-10-CM

## 2020-05-27 DIAGNOSIS — R413 Other amnesia: Secondary | ICD-10-CM

## 2020-05-27 MED ORDER — DONEPEZIL HCL 5 MG PO TABS: 5.0000 mg | ORAL_TABLET | Freq: Every day | ORAL | 0 refills | Status: DC

## 2020-05-27 NOTE — Progress Notes (Signed)
Reason for visit: Peripheral neuropathy, gait disturbance, cerebrovascular disease, memory disorder  [name] [name] is an 75 y.o. female  History of present illness:  [name] is a 75 year old right-handed white female with a history of diabetes associated with diabetic peripheral neuropathy.  The patient has history of cerebrovascular disease, she sustained a right posterior cerebral artery distribution stroke in the past.  She does not operate a motor vehicle at this time.  She reports some ongoing decline in memory with short-term memory issues.  She has some dizziness oftentimes with standing.  She uses a walker for ambulation, she has not had any recent falls.  She continues to have some stuttering speech off and on.  She returns for further evaluation.  She did have a carotid Doppler study in August 2021 that was unremarkable.  Past Medical History:  Diagnosis Date  . Anemia    hx  . Anxiety   . Asthma   . Basal cell carcinoma 05/2014   "left shoulder"  . Bundle branch block, left    chronic/notes 07/18/2013  . CHF (congestive heart failure) ([place])   . Chronic insomnia 05/06/2015  . Chronic kidney disease    frequency, sees dr Jamal Maes every 4 to 6 months (01/16/2018)  . Chronic lower back pain   . Claustrophobia   . Common migraine 05/14/2014  . Coronary artery disease    MI in 2001, 2002, 2006, 2011, 2014  . Depression   . Diabetic peripheral neuropathy ([place]) 01/12/2019  . GERD (gastroesophageal reflux disease)   . H/O hiatal hernia   . Headache    "at least 2/month" (01/16/2018)  . Heart murmur   . Hyperlipidemia   . Hypertension   . Memory change 05/14/2014  . Migraine    "5-6/year"  (01/16/2018)  . Obesity 01-2010  . Obstructive sleep apnea    "can't wear machine; I have claustrophobia" (01/16/2018), states she had a 2nd sleep study and does not have sleep apnea, her O2 decreases and now is on 2 L of O2 at night.  . On home oxygen therapy    "2L at night  and prn during daytime" (01/16/2018)  . Osteoarthritis    "knees and hands" (01/16/2018)  . Peripheral vascular disease (HCC)    ? numbness, tingling arms and legs  . PONV (postoperative nausea and vomiting)   . Stroke [facility])    2014, 2015, 2016  . Type II diabetes mellitus ([place])   . Ventral hernia    hx of    Past Surgical History:  Procedure Laterality Date  . ANKLE FRACTURE SURGERY Left 1970's  . APPENDECTOMY  1970's   w/hysterectomy  . BASAL CELL CARCINOMA EXCISION Left 05/2014   "shoulder" (01/16/2018)  . CARDIAC CATHETERIZATION  10/10/2012   Dr [name].  Marland Kitchen CARDIAC CATHETERIZATION N/A 05/29/2015   Procedure: Right/Left Heart Cath and Coronary/Graft Angiography;  Surgeon: Larey Dresser, MD;  Location: [place] [facility];  Service: Cardiovascular;  Laterality: N/A;  . CAROTID ENDARTERECTOMY Left 03/2013  . CAROTID STENT INSERTION Left 03/20/2013   Procedure: CAROTID STENT INSERTION;  Surgeon: [name], MD;  Location: [facility] CATH LAB;  Service: Cardiovascular;  Laterality: Left;  internal carotid  . CEREBRAL ANGIOGRAM N/A 04/05/2011   Procedure: CEREBRAL ANGIOGRAM;  Surgeon: Angelia Mould, MD;  Location: [facility] CATH LAB;  Service: Cardiovascular;  Laterality: N/A;  . CHOLECYSTECTOMY OPEN  2004  . CORONARY ANGIOPLASTY WITH STENT PLACEMENT  01,02,05,06,07,08,11; 04/24/2013   "I've probably got ~  10 stents by now" (04/24/2013)  . CORONARY ANGIOPLASTY WITH STENT PLACEMENT  06/13/2013   "got 4 stents today" (06/13/2013)  . CORONARY ARTERY BYPASS GRAFT  1220/11   "CABG X5"  . CORONARY STENT INTERVENTION N/A 10/20/2017   Procedure: CORONARY STENT INTERVENTION;  Surgeon: Troy Sine, MD;  Location: [place] [facility];  Service: Cardiovascular;  Laterality: N/A;  . ESOPHAGOGASTRODUODENOSCOPY  08/03/2011   Procedure: ESOPHAGOGASTRODUODENOSCOPY (EGD);  Surgeon: Shann Medal, MD;  Location: [facility];  Service: General;  Laterality: N/A;  . ESOPHAGOGASTRODUODENOSCOPY (EGD) WITH  PROPOFOL N/A 03/11/2014   Procedure: ESOPHAGOGASTRODUODENOSCOPY (EGD) WITH PROPOFOL;  Surgeon: Lafayette Dragon, MD;  Location: [facility]OS[facility];  Service: Endoscopy;  Laterality: N/A;  . FRACTURE SURGERY    . gall stone removal  05/2003  . GASTRIC RESTRICTION SURGERY  1984   "stapeling"  . HERNIA REPAIR  2004   "in my stomach; had OR on it twice", wire mesh on 1 hernia  . LEFT HEART CATH AND CORS/GRAFTS ANGIOGRAPHY N/A 07/09/2016   Procedure: LEFT HEART CATH AND CORS/GRAFTS ANGIOGRAPHY;  Surgeon: Larey Dresser, MD;  Location: [place] [facility];  Service: Cardiovascular;  Laterality: N/A;  . ORIF ANKLE FRACTURE Left 05/16/2018  . ORIF ANKLE FRACTURE Left 05/16/2018   Procedure: OPEN REDUCTION INTERNAL FIXATION (ORIF) Left ankle with possible syndesmosis fixation;  Surgeon: Nicholes Stairs, MD;  Location: [place];  Service: Orthopedics;  Laterality: Left;  121min  . OVARY SURGERY  1970's   "tumor removed"  . PERCUTANEOUS CORONARY STENT INTERVENTION (PCI-S) N/A 06/13/2013   Procedure: PERCUTANEOUS CORONARY STENT INTERVENTION (PCI-S);  Surgeon: [name], MD;  Location: [facility] CATH LAB;  Service: Cardiovascular;  Laterality: N/A;  . PERCUTANEOUS STENT INTERVENTION N/A 04/24/2013   Procedure: PERCUTANEOUS STENT INTERVENTION;  Surgeon: [name], MD;  Location: [facility] CATH LAB;  Service: Cardiovascular;  Laterality: N/A;  . RIGHT/LEFT HEART CATH AND CORONARY/GRAFT ANGIOGRAPHY N/A 10/20/2017   Procedure: RIGHT/LEFT HEART CATH AND CORONARY/GRAFT ANGIOGRAPHY;  Surgeon: Larey Dresser, MD;  Location: [place] [facility];  Service: Cardiovascular;  Laterality: N/A;  . ROOT CANAL  10/2000  . TEE WITHOUT CARDIOVERSION N/A 01/18/2019   Procedure: TRANSESOPHAGEAL ECHOCARDIOGRAM (TEE);  Surgeon: Larey Dresser, MD;  Location: [facility] ENDOSCOPY;  Service: Cardiovascular;  Laterality: N/A;  . TIBIA FRACTURE SURGERY Right 1970's   rods and pins  . TOOTH EXTRACTION     "1 on the upper; wisdom tooth on the  lower" (01/16/2018)  . TOTAL ABDOMINAL HYSTERECTOMY  1970's   w/ appendectomy    Family History  Problem Relation Age of Onset  . Heart disease Father        Heart Disease before age 57  . Diabetes Father   . Hyperlipidemia Father   . Hypertension Father   . Heart attack Father   . Deep vein thrombosis Father   . AAA (abdominal aortic aneurysm) Father   . Hypertension Mother   . Deep vein thrombosis Mother   . AAA (abdominal aortic aneurysm) Mother   . Cancer Sister        Ovarian  . Hypertension Sister   . Diabetes Paternal Grandmother   . Heart disease Paternal Uncle   . Diabetes Paternal Uncle   . Heart attack Paternal Grandfather 49       died of MI at 73  . Diabetes Paternal Aunt   . Diabetes Paternal Uncle   . Colon cancer Neg Hx     Social history:  reports that she has never smoked. She has never used smokeless tobacco. She reports that she does not drink alcohol and does not use drugs.    Allergies  Allergen Reactions  . Amoxicillin Shortness Of Breath and Rash  . Brilinta [Ticagrelor] Shortness Of Breath  . Erythromycin Shortness Of Breath, Other (See Comments) and Hives    Trouble swallowing  . Flagyl [Metronidazole] Shortness Of Breath and Palpitations  . Penicillins Hives, Shortness Of Breath, Rash and Other (See Comments)    Has patient had a PCN reaction causing immediate rash, facial/tongue/throat swelling, SOB or lightheadedness with hypotension: Yes Has patient had a PCN reaction causing severe rash involving mucus membranes or skin necrosis: No Has patient had a PCN reaction that required hospitalization: Yes Has patient had a PCN reaction occurring within the last 10 years: No If all of the above answers are "NO", then may proceed with Cephalosporin use.   . Isosorbide Mononitrate [Isosorbide Nitrate] Other (See Comments)    Joint aches, muscles hurt, difficult to walk   . Jardiance [Empagliflozin] Other (See Comments)    Nausea, joint aches,  muscles aches  . Metformin And Related Other (See Comments)    Stomach pain, cold sweats, joint pain, burred vision, dizziness  . Tape Other (See Comments)    Skin pulls off with certain types Plastic tape causes skin to rip if left on for long periods of time  . Erythromycin Base Rash    Medications:  Prior to Admission medications   Medication Sig Start Date End Date Taking? Authorizing Provider  acetaminophen (TYLENOL) 500 MG tablet Take 500 mg by mouth every 6 (six) hours as needed for mild pain, moderate pain, fever or headache.   Yes [provider]  albuterol (VENTOLIN HFA) 108 (90 Base) MCG/ACT inhaler Inhale 2 puffs into the lungs every 4 (four) hours as needed for wheezing or shortness of breath.    Yes [provider]  Alirocumab (PRALUENT) 150 MG/ML SOAJ Inject 150 mg/mL into the skin every 14 (fourteen) days.   Yes [provider]  ALPRAZolam Duanne Moron) 0.5 MG tablet Take 1-2 tablets (0.5-1 mg total) by mouth See admin instructions. Take one tablet (0.5 mg) by mouth every morning and two tablets (1 mg) at night, may also take 1 tablet (0.5 mg) midday as needed for anxiety 01/19/19  Yes Aline August, MD  amLODipine (NORVASC) 10 MG tablet TAKE ONE (1) TABLET BY MOUTH EACH DAY 02/21/20  Yes Larey Dresser, MD  apixaban (ELIQUIS) 5 MG TABS tablet Take 1 tablet (5 mg total) by mouth 2 (two) times daily. 09/11/19  Yes Larey Dresser, MD  B Complex-C (B-COMPLEX WITH VITAMIN C) tablet Take 1 tablet by mouth daily.   Yes [provider]  bisacodyl (DULCOLAX) 5 MG EC tablet Take 5 mg by mouth daily as needed for moderate constipation.   Yes [provider]  buPROPion (WELLBUTRIN XL) 150 MG 24 hr tablet Take 150 mg by mouth every morning. 01/25/19  Yes [provider]  cholecalciferol (VITAMIN D3) 25 MCG (1000 UNIT) tablet Take 1,000 Units by mouth daily.   Yes [provider]  clopidogrel (PLAVIX) 75 MG tablet TAKE ONE TABLET BY  MOUTH AT BEDTIME 03/21/20  Yes Larey Dresser, MD  denosumab (PROLIA) 60 MG/ML SOSY injection Inject 60 mg into the skin every 6 (six) months.    Yes [provider]  DULoxetine (CYMBALTA) 30 MG capsule Take 1 capsule (30 mg total) by mouth daily.  09/28/18  Yes Angiulli, Lavon Paganini, PA-C  ezetimibe (ZETIA) 10 MG tablet Take 1 tablet (10 mg total) by mouth at bedtime. 04/10/18  Yes Cherene Altes, MD  Fluticasone-Salmeterol (ADVAIR) 250-50 MCG/DOSE AEPB Inhale 1 puff into the lungs daily.   Yes [provider]  gabapentin (NEURONTIN) 600 MG tablet Take 600 mg by mouth 4 (four) times daily as needed. 07/04/19  Yes [provider]  Insulin Glargine (LANTUS SOLOSTAR) 100 UNIT/ML Solostar Pen Inject 38-44 Units into the skin See admin instructions. Inject 44 units subcutaneously daily at bedtime, increase to 44 units for CBG >200   Yes [provider]  insulin lispro (HUMALOG) 100 UNIT/ML KwikPen Inject 14-20 Units into the skin See admin instructions. Inject 14 units subcutaneously prior to breakfast and supper; add 4 units for CBG >200   Yes [provider]  ipratropium-albuterol (DUONEB) 0.5-2.5 (3) MG/3ML SOLN Take 3 mLs by nebulization 2 (two) times daily as needed (asthma). 01/19/19  Yes Aline August, MD  methocarbamol (ROBAXIN) 500 MG tablet Take 500 mg by mouth 3 (three) times daily as needed. 05/25/19  Yes [provider]  Multiple Vitamin (MULTIVITAMIN WITH MINERALS) TABS tablet Take 1 tablet by mouth every morning. Centrum - Women over 15   Yes [provider]  Multiple Vitamins-Minerals ([place]) CAPS Take 1 capsule by mouth every morning.   Yes [provider]  nitroGLYCERIN (NITROSTAT) 0.3 MG SL tablet Place 1 tablet (0.3 mg total) under the tongue every 5 (five) minutes x 3 doses as needed for chest pain. 05/13/20  Yes Larey Dresser, MD  ondansetron (ZOFRAN-ODT) 4 MG disintegrating tablet Take 4 mg by  mouth 2 (two) times daily as needed for nausea or vomiting.   Yes [provider]  OXYGEN Inhale 2 L into the lungs at bedtime as needed (shortness of breath).    Yes [provider]  pantoprazole (PROTONIX) 40 MG tablet Take 1 tablet (40 mg total) by mouth daily. 09/28/18  Yes Angiulli, Lavon Paganini, PA-C  Polyvinyl Alcohol-Povidone (REFRESH OP) Place 1 drop into both eyes daily as needed (redness/ dry eyes).   Yes [provider]  potassium chloride SA (KLOR-CON) 20 MEQ tablet TAKE 2 TABLETS BY MOUTH EVERY MORNING AND 1 TABLET EVERY EVENING 03/21/20  Yes Larey Dresser, MD  Pyridoxine HCl (VITAMIN B-6 PO) Take 1 tablet by mouth every morning.   Yes [provider]  ranolazine (RANEXA) 1000 MG SR tablet TAKE ONE TABLET BY MOUTH TWICE DAILY 01/11/20  Yes Larey Dresser, MD  rosuvastatin (CRESTOR) 40 MG tablet Take 1 tablet (40 mg total) by mouth at bedtime. 09/28/18  Yes Angiulli, Lavon Paganini, PA-C  sacubitril-valsartan (ENTRESTO) 24-26 MG Take 1 tablet by mouth 2 (two) times daily. 05/13/20  Yes Larey Dresser, MD  spironolactone (ALDACTONE) 25 MG tablet TAKE 1 TABLET BY MOUTH DAILY 02/21/20  Yes Larey Dresser, MD  torsemide (DEMADEX) 20 MG tablet Take 4 tablets (80 mg total) by mouth 2 (two) times daily. 09/11/19  Yes Larey Dresser, MD  traZODone (DESYREL) 50 MG tablet Take 1 tablet (50 mg total) by mouth at bedtime. 11/20/19  Yes Suzzanne Cloud, NP  triamcinolone cream (KENALOG) 0.1 % Apply 1 application topically daily as needed (crusty spots on arms).   Yes [provider]  vitamin B-12 (CYANOCOBALAMIN) 1000 MCG tablet Take 1,000 mcg by mouth every morning.    Yes [provider]  zolpidem (AMBIEN) 10 MG  tablet Take 0.5 tablets (5 mg total) by mouth at bedtime. 12/26/18  Yes Vann, Jessica U, DO    ROS:  Out of a complete 14 system review of symptoms, the patient complains only of the following symptoms, and all other reviewed systems are  negative.  Walking difficulty Memory problems  Blood pressure (!) 194/83, pulse 69, height 5\' 2"  (1.575 m), weight 212 lb (96.2 kg).  Physical Exam  General: The patient is alert and cooperative at the time of the examination.  The patient is markedly obese.  Skin: No significant peripheral edema is noted.   Neurologic Exam  Mental status: The patient is alert and oriented x 3 at the time of the examination. The patient has apparent normal recent and remote memory, with an apparently normal attention span and concentration ability.   Cranial nerves: Facial symmetry is present. Speech is normal, no aphasia or dysarthria is noted. Extraocular movements are full. Visual fields are full.  Motor: The patient has good strength in all 4 extremities.  Sensory examination: Soft touch sensation is symmetric on the face, arms, and legs.  Coordination: The patient has good finger-nose-finger and heel-to-shin bilaterally.  Gait and station: The patient is able to walk slowly with a walker, slow with turns.  Romberg is negative.  Tandem gait was not attempted.  Reflexes: Deep tendon reflexes are symmetric.   Assessment/Plan:  1.  Cerebrovascular disease  2.  Stuttering speech pattern, likely nonorganic  3.  Gait disorder  4.  Diabetic peripheral neuropathy  5.  Reported memory disturbance   The patient will be started on low-dose Aricept at this time, we will work-up with 10 mg dose.  She will follow up here in about 6 months, will follow the memory issues over time.  The patient will call for any dose adjustments.   Jill Alexanders MD 05/27/2020 12:27 PM  [facility] [address] [place], [place] [address]  Phone [phone] Fax [phone]

## 2020-05-27 NOTE — Patient Instructions (Signed)
WE will start aricept for the memory.  Begin Aricept (donepezil) at 5 mg at night for one month. If this medication is well-tolerated, please call our office and we will call in a prescription for the 10 mg tablets. Look out for side effects that may include nausea, diarrhea, weight loss, or stomach cramps. This medication will also cause a runny nose, therefore there is no need for allergy medications for this purpose.

## 2020-05-30 ENCOUNTER — Other Ambulatory Visit (HOSPITAL_COMMUNITY): Payer: Self-pay | Admitting: Cardiology

## 2020-06-19 ENCOUNTER — Ambulatory Visit (HOSPITAL_COMMUNITY)
Admission: RE | Admit: 2020-06-19 | Discharge: 2020-06-19 | Disposition: A | Discharge status: Home / Self Care | Payer: Medicare Other | Source: Ambulatory Visit | Attending: Cardiology | Admitting: Cardiology

## 2020-06-19 ENCOUNTER — Encounter (HOSPITAL_COMMUNITY): Payer: Self-pay

## 2020-06-19 ENCOUNTER — Other Ambulatory Visit: Payer: Self-pay

## 2020-06-19 ENCOUNTER — Ambulatory Visit (HOSPITAL_COMMUNITY)
Admission: RE | Admit: 2020-06-19 | Discharge: 2020-06-19 | Disposition: A | Discharge status: Home / Self Care | Payer: Medicare Other | Source: Ambulatory Visit

## 2020-06-19 VITALS — BP 152/82 | HR 54 | Wt 215.0 lb

## 2020-06-19 DIAGNOSIS — Z951 Presence of aortocoronary bypass graft: Secondary | ICD-10-CM | POA: Diagnosis not present

## 2020-06-19 DIAGNOSIS — Z7901 Long term (current) use of anticoagulants: Secondary | ICD-10-CM | POA: Insufficient documentation

## 2020-06-19 DIAGNOSIS — I2510 Atherosclerotic heart disease of native coronary artery without angina pectoris: Secondary | ICD-10-CM | POA: Diagnosis not present

## 2020-06-19 DIAGNOSIS — R001 Bradycardia, unspecified: Secondary | ICD-10-CM | POA: Diagnosis not present

## 2020-06-19 DIAGNOSIS — I5042 Chronic combined systolic (congestive) and diastolic (congestive) heart failure: Secondary | ICD-10-CM | POA: Diagnosis present

## 2020-06-19 DIAGNOSIS — I110 Hypertensive heart disease with heart failure: Secondary | ICD-10-CM | POA: Diagnosis not present

## 2020-06-19 DIAGNOSIS — Z79899 Other long term (current) drug therapy: Secondary | ICD-10-CM | POA: Diagnosis not present

## 2020-06-19 DIAGNOSIS — I447 Left bundle-branch block, unspecified: Secondary | ICD-10-CM | POA: Diagnosis not present

## 2020-06-19 DIAGNOSIS — Z8673 Personal history of transient ischemic attack (TIA), and cerebral infarction without residual deficits: Secondary | ICD-10-CM | POA: Diagnosis not present

## 2020-06-19 DIAGNOSIS — I080 Rheumatic disorders of both mitral and aortic valves: Secondary | ICD-10-CM | POA: Insufficient documentation

## 2020-06-19 DIAGNOSIS — I480 Paroxysmal atrial fibrillation: Secondary | ICD-10-CM | POA: Insufficient documentation

## 2020-06-19 DIAGNOSIS — Z955 Presence of coronary angioplasty implant and graft: Secondary | ICD-10-CM | POA: Insufficient documentation

## 2020-06-19 DIAGNOSIS — E785 Hyperlipidemia, unspecified: Secondary | ICD-10-CM | POA: Diagnosis not present

## 2020-06-19 DIAGNOSIS — E669 Obesity, unspecified: Secondary | ICD-10-CM | POA: Insufficient documentation

## 2020-06-19 DIAGNOSIS — I5022 Chronic systolic (congestive) heart failure: Secondary | ICD-10-CM

## 2020-06-19 LAB — BASIC METABOLIC PANEL
Anion gap: 7 (ref 5–15)
BUN: 17 mg/dL (ref 8–23)
CO2: 28 mmol/L (ref 22–32)
Calcium: 9 mg/dL (ref 8.9–10.3)
Chloride: 105 mmol/L (ref 98–111)
Creatinine, Ser: 1.02 mg/dL — ABNORMAL HIGH (ref 0.44–1.00)
GFR, Estimated: 58 mL/min — ABNORMAL LOW (ref >60)
Glucose, Bld: 136 mg/dL — ABNORMAL HIGH (ref 70–99)
Potassium: 4.2 mmol/L (ref 3.5–5.1)
Sodium: 140 mmol/L (ref 135–145)

## 2020-06-19 MED ORDER — ENTRESTO 49-51 MG PO TABS: 1.0000 | ORAL_TABLET | Freq: Two times a day (BID) | ORAL | 3 refills | Status: DC

## 2020-06-19 NOTE — Progress Notes (Signed)
PCP:  Derinda Late, MD    Cardiologist:  Dr. [name]  HPI:  [name] [name] is a 75 y.o. female who has a history of CAD s/p CABG, diastolic CHF, and cerebrovascular disease with history of CVA presents for followup of CHF and diastolic CHF. She had CABG x 5 in 12/11. Prior to the CABG she had multiple PCIs. She had a CVA in 2/14 that presented as visual loss. She had a left carotid stent in 2/15.   Left heart cath done in Sept. 2014. This showed patent SVG-D, LIMA-LAD, and sequential SVG-OM branches but SVG-RCA and the native RCA were both totally occluded. It was felt aht her increased symptoms coincided with occlusion of SVG-RCA. She was started on Imdur to see if this would help with the chest pain and dyspnea. However, she feels like Imdur causes leg cramps and does not think that she can take it. So ranolazine 500 mg bid started and titrated this up to 1000 mg bid. This helped some but not markedly. Therefore, sent to Dr. Irish Lack to address opening RCA CTO. He was able to do this in 5/15 with 4 overlapping Xience DES. This led to resolution of exertional chest pain.   [name] [name] was started on Brilinta after PCI. She became much more short of breath after starting on Brilinta. Per Dr Delano Metz stopped and replaced with Plavix. Dyspnea improved off Brilinta. Given increasing exertional dyspnea and chest pain, RHC/LHC was done in 4/17. This showed stable CAD with no interventional target. Left and right heart filling pressures were not significantly increased. Medical management.   She had an MRI/MRA in May 2018 that showed moderately severe stenosis of the supraclinoid segment of the right internal carotid artery, progressed since the 2016 MR angiogram and severe basilar artery stenosis.   She developed recurrent exertional chest pain and had LHC in 6/18, showing 4/5 grafts patent with patent native RCA (similar to prior cath, no changes).   Echo 1/19 with EF  55-60%, moderate diastolic dysfunction, PASP 50 mmHg, mildly dilated RV, mild AS, mild-moderate MR.   She reported increased dyspnea and chest pain and had RHC/LHC in 9/19. She had DES to sequential SVG-OM1/PLOM. While hospitalized, she was noted to have runs of atrial fibrillation. She was very symptomatic with the atrial fibrillation. Eliquis and amiodarone were started.   She was admitted 12/16-12/19/19 for cardiomems implant and RHC. She was diuresed with IV lasix and transitioned to torsemide 100 mg BID + metolazone once/week. DC weight: 295 lbs.  She had a prolonged hospitalization in 2/20 with acute respiratory failure and fever to 103.  She was thought to have PNA and treated with cefepime.  She was intubated.  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  Amiodarone was stopped and she was put on prednisone. Echo in 2/20 showed EF down to 25-30% with mid-apical LV severe hypokinesis. She had AKI. Possible ITP triggered by infection, HIT negative.  ITP was treated with Solumedrol. She was discharged to SNF.    ORIF left ankle 4/20.   11/20 admission initially with concern for TIAs (staring spells), ended up thinking possible partial seizures.  Head MRI did not show CVA.  Echo in 11/20 showed EF 45-50%, moderately decreased RV systolic function, moderate RV enlargement, moderate-severe MR, moderate TR.   Patient was admitted again in 12/20 with AKI and hypotension in the setting of sinus bradycardia (HR 30s).  She was volume overloaded on exam.  Toprol XL was stopped  and HR increased to 60s.  She was diuresed with IV Lasix.  TEE in 12/20 showed EF 50-55%, septal-lateral dyssynchrony, mildly decreased RV function, moderate MR.   Atypical chest pain in 1/21, Cardiolite showed EF 57%, fixed inferior defect (likely artifact), no ischemia.   She returned to CHF clinic with Dr. [name] on 4/12 for followup of CHF and CAD.  Still not using Cardiomems.  Weight was  stable.  Uses rolling walker for balance. Short of breath with longer walks and stairs.  Occasional palpitations.  She would get random, nonexertional chest pain several times/week.  Pain will last 2-10 minutes.  This has been going on for years.  No lightheadedness. No falls.  No orthopnea/PND.   Today she returns to HF clinic for pharmacist medication titration. At last visit with MD, losartan was stopped and was started on Entresto 24/26 mg BID. Repeat ECHO from this afternoon is completed, pending MD review. Overall, she has not been feeling well. She says she has been more fatigued and short of breath when talking a lot. She has reduced her exercise from 5-7 days per week (1 hour sessions), down to 2-3 times per week (30 min sessions). She only gets dizzy or lightheaded when standing or turning quickly. She had an episode of chest pain about 3 weeks ago that required nitro SL tabs but this resolved shortly after taking the nitro. She states her HR is usually in the 50-60s but recently has been up to the 62s and says she can feel like her heart is racing when this happens. Her weight at home has been around 210-218 lbs. She takes torsemide 80 qAM, 40 qPM. Last took 80 in PM about 3 weeks ago when she was up to 218 lbs. This AM was 215 lbs at home, 215 lbs in clinic. She eats about 2 meals per day and states she is experiencing early satiety since she has not been feeling well. Only mild LE edema on exam. Lungs clear. ReDS 30%.   HF Medications: Torsemide 80 mg every morning, 40 mg every evening Entresto 24/26 mg BID Spironolactone 25 mg daily Amlodipine 10 mg daily  Has the patient been experiencing any side effects to the medications prescribed?  no  Does the patient have any problems obtaining medications due to transportation or finances?   Yes - receives Entresto through [employer] of regimen: excellent Understanding of indications: excellent Potential of compliance:  excellent Patient understands to avoid NSAIDs. Patient understands to avoid decongestants.    Pertinent Lab Values: . Serum creatinine 1.02, BUN 17, Potassium 4.2, Sodium 140  Vital Signs: . Weight: 215 lbs (last clinic weight: 212 lbs) . Blood pressure: 152/82  . Heart rate: 54   Assessment/Plan: 1. CAD: Occluded native RCA and SVG-RCA. Status post opening of CTO RCA with 4 overlapping Xience DES in 5/15. DES to sequential SVG-OM1/PLOM in 9/19. She has been on Plavix and Eliquis.  She has atypical chest pain, no change to pattern.  No ischemia on 1/21 Cardiolite.  - Continuing on Plavix (neuro has wanted her to stay on this for cerebrovascular disease).   - She has not tolerated Imdur in the past. Off Toprol XL with bradycardia.  Continue ranolazine 1000 mg bid.  2. Chronic systolic => diastolic CHF: Echo in 7/41 with EF 55-60%, moderate diastolic dysfunction.Cardiomems placed 01/16/18.  Echo in 3/20 in setting of severe PNA with intubation showed EF 25-30%, mid-apical LV severe hypokinesis.  Possible stress (Takotsubo-type) cardiomyopathy related to severe  medical illness versus progression of CAD.  She has baseline chronic LBBB which could play a role in cardiomyopathy as well (LBBB cardiomyopathy).  Repeat echo in 11/20 with EF up to 45-50%, moderately dysfunctional RV.  TEE in 12/20 with EF 50-55% with septal-lateral dyssynchrony and mildly dysfunctional RV. Repeat ECHO from today is pending. NYHA class II-III symptoms.  Mild volume overload although lungs clear and ReDS normal at 30%. Question if some of her respiratory symptoms could be related to virus / allergies.    - Continue torsemide 80 mg qAM, 40 mg qPM, BMET today.   - Bradycardia does not allow for beta blocker at this time. - Increase Entresto to 49/51 mg BID.  This should allow some additional diuresis. Prescription sent to [facility] for [employer] patient assistance. She was instructed to call tomorrow to request expedited  shipment.  - Continue spironolactone 25 mg daily.  3. Hyperlipidemia: She remains on Zetia, Crestor and Praluent. 4. Hypertension:BP high, increasing Entresto as above.  5. Cerebrovascular disease: She has history of CVA and had left carotid stent in 2/15. Followed by [facility]. Most recent MRA head showed severe stenosis of supraclinoid RICA and severe basilar artery stenosis.  - Neurology would like her to continue Plavix for this despite concomitant apixaban use.  6. OHS/OSA: Cannot tolerate CPAP. Uses oxygen with exertion and at night. No change. 7. Atrial fibrillation: Paroxysmal, no recent palpitations. 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  - She has completed course of prednisone for amiodarone lung toxicity.   - Continue apixaban 5 mg bid.  8. Obesity: Weight has trended down over the last year.  9. Mitral regurgitation: Moderate to severe MR on last echo in 11/20 but moderate on 12/20 TEE.  No significant enough for Mitraclip. Repeat ECHO today is pending. 10. Bradycardia: Now off Toprol XL.    Follow up with HF clinic NP/PA on July 12th. Instructed her to call for repeat visit if her symptoms do not improve over the next few weeks.  Kerby [name], PharmD, BCPS Heart Failure Clinic Pharmacist [phone]

## 2020-06-19 NOTE — Patient Instructions (Signed)
It was a pleasure seeing you today!  MEDICATIONS: -We are changing your medications today -Increase Entresto to 1 tablet (49/51 mg) by mouth twice daily. You can take 2 tablets of 24/26 mg twice daily until you receive the new prescription in the mail.  -Call if you have questions about your medications.  LABS: -We will call you if your labs need attention.  NEXT APPOINTMENT: Return to clinic in July with HF NP/PA.  In general, to take care of your heart failure: -Limit your fluid intake to 2 Liters (half-gallon) per day.   -Limit your salt intake to ideally 2-3 grams (2000-3000 mg) per day. -Weigh yourself daily and record, and bring that "weight diary" to your next appointment.  (Weight gain of 2-3 pounds in 1 day typically means fluid weight.) -The medications for your heart are to help your heart and help you live longer.   -Please contact us before stopping any of your heart medications.  Call the clinic at [phone] with questions or to reschedule future appointments.

## 2020-06-19 NOTE — Progress Notes (Signed)
  Echocardiogram 2D Echocardiogram has been performed.  Matilde Bash 06/19/2020, 1:43 PM

## 2020-06-21 LAB — ECHOCARDIOGRAM COMPLETE
AR max vel: 1.92 cm2
AV Area VTI: 1.83 cm2
AV Area mean vel: 1.75 cm2
AV Mean grad: 12 mmHg
AV Peak grad: 18.7 mmHg
Ao pk vel: 2.17 m/s
Area-P 1/2: 2.91 cm2
S' Lateral: 4.3 cm

## 2020-07-21 DIAGNOSIS — H524 Presbyopia: Secondary | ICD-10-CM | POA: Insufficient documentation

## 2020-08-02 ENCOUNTER — Other Ambulatory Visit (HOSPITAL_COMMUNITY): Payer: Self-pay | Admitting: Cardiology

## 2020-08-10 ENCOUNTER — Encounter (HOSPITAL_BASED_OUTPATIENT_CLINIC_OR_DEPARTMENT_OTHER): Payer: Self-pay | Admitting: Emergency Medicine

## 2020-08-10 ENCOUNTER — Emergency Department (HOSPITAL_BASED_OUTPATIENT_CLINIC_OR_DEPARTMENT_OTHER): Payer: Medicare Other

## 2020-08-10 ENCOUNTER — Other Ambulatory Visit: Payer: Self-pay

## 2020-08-10 ENCOUNTER — Emergency Department (HOSPITAL_BASED_OUTPATIENT_CLINIC_OR_DEPARTMENT_OTHER)
Admission: EM | Admit: 2020-08-10 | Discharge: 2020-08-10 | Disposition: A | Discharge status: Home / Self Care | Payer: Medicare Other | Attending: Emergency Medicine | Admitting: Emergency Medicine

## 2020-08-10 DIAGNOSIS — Z20822 Contact with and (suspected) exposure to covid-19: Secondary | ICD-10-CM | POA: Diagnosis not present

## 2020-08-10 DIAGNOSIS — J45909 Unspecified asthma, uncomplicated: Secondary | ICD-10-CM | POA: Insufficient documentation

## 2020-08-10 DIAGNOSIS — I2510 Atherosclerotic heart disease of native coronary artery without angina pectoris: Secondary | ICD-10-CM | POA: Insufficient documentation

## 2020-08-10 DIAGNOSIS — R0602 Shortness of breath: Secondary | ICD-10-CM | POA: Diagnosis not present

## 2020-08-10 DIAGNOSIS — R059 Cough, unspecified: Secondary | ICD-10-CM | POA: Insufficient documentation

## 2020-08-10 DIAGNOSIS — N183 Chronic kidney disease, stage 3 unspecified: Secondary | ICD-10-CM | POA: Insufficient documentation

## 2020-08-10 DIAGNOSIS — I5022 Chronic systolic (congestive) heart failure: Secondary | ICD-10-CM | POA: Insufficient documentation

## 2020-08-10 DIAGNOSIS — Z7902 Long term (current) use of antithrombotics/antiplatelets: Secondary | ICD-10-CM | POA: Insufficient documentation

## 2020-08-10 DIAGNOSIS — Z7901 Long term (current) use of anticoagulants: Secondary | ICD-10-CM | POA: Insufficient documentation

## 2020-08-10 DIAGNOSIS — E1122 Type 2 diabetes mellitus with diabetic chronic kidney disease: Secondary | ICD-10-CM | POA: Diagnosis not present

## 2020-08-10 DIAGNOSIS — I130 Hypertensive heart and chronic kidney disease with heart failure and stage 1 through stage 4 chronic kidney disease, or unspecified chronic kidney disease: Secondary | ICD-10-CM | POA: Insufficient documentation

## 2020-08-10 DIAGNOSIS — Z79899 Other long term (current) drug therapy: Secondary | ICD-10-CM | POA: Insufficient documentation

## 2020-08-10 DIAGNOSIS — Z794 Long term (current) use of insulin: Secondary | ICD-10-CM | POA: Diagnosis not present

## 2020-08-10 DIAGNOSIS — M546 Pain in thoracic spine: Secondary | ICD-10-CM | POA: Insufficient documentation

## 2020-08-10 DIAGNOSIS — Z951 Presence of aortocoronary bypass graft: Secondary | ICD-10-CM | POA: Diagnosis not present

## 2020-08-10 LAB — CBC
HCT: 41.4 % (ref 36.0–46.0)
Hemoglobin: 14 g/dL (ref 12.0–15.0)
MCH: 31.2 pg (ref 26.0–34.0)
MCHC: 33.8 g/dL (ref 30.0–36.0)
MCV: 92.2 fL (ref 80.0–100.0)
Platelets: 209 10*3/uL (ref 150–400)
RBC: 4.49 MIL/uL (ref 3.87–5.11)
RDW: 14 % (ref 11.5–15.5)
WBC: 16.7 10*3/uL — ABNORMAL HIGH (ref 4.0–10.5)
nRBC: 0 % (ref 0.0–0.2)

## 2020-08-10 LAB — TROPONIN I (HIGH SENSITIVITY)
Troponin I (High Sensitivity): 24 ng/L — ABNORMAL HIGH (ref <18)
Troponin I (High Sensitivity): 24 ng/L — ABNORMAL HIGH (ref <18)

## 2020-08-10 LAB — D-DIMER, QUANTITATIVE: D-Dimer, Quant: 1.1 ug/mL-FEU — ABNORMAL HIGH (ref 0.00–0.50)

## 2020-08-10 LAB — BASIC METABOLIC PANEL
Anion gap: 11 (ref 5–15)
BUN: 11 mg/dL (ref 8–23)
CO2: 24 mmol/L (ref 22–32)
Calcium: 8.7 mg/dL — ABNORMAL LOW (ref 8.9–10.3)
Chloride: 101 mmol/L (ref 98–111)
Creatinine, Ser: 0.87 mg/dL (ref 0.44–1.00)
GFR, Estimated: >60 mL/min (ref >60)
Glucose, Bld: 232 mg/dL — ABNORMAL HIGH (ref 70–99)
Potassium: 3.9 mmol/L (ref 3.5–5.1)
Sodium: 136 mmol/L (ref 135–145)

## 2020-08-10 LAB — RESP PANEL BY RT-PCR (FLU A&B, COVID) ARPGX2
Influenza A by PCR: NEGATIVE
Influenza B by PCR: NEGATIVE
SARS Coronavirus 2 by RT PCR: NEGATIVE

## 2020-08-10 LAB — BRAIN NATRIURETIC PEPTIDE: B Natriuretic Peptide: 469.3 pg/mL — ABNORMAL HIGH (ref 0.0–100.0)

## 2020-08-10 MED ORDER — FUROSEMIDE 10 MG/ML IJ SOLN
80.0000 mg | Freq: Once | INTRAMUSCULAR | Status: AC
  Administered 2020-08-10: 80 mg via INTRAVENOUS
  Filled 2020-08-10: qty 8

## 2020-08-10 MED ORDER — IOHEXOL 350 MG/ML SOLN
100.0000 mL | Freq: Once | INTRAVENOUS | Status: AC | PRN
  Administered 2020-08-10: 85 mL via INTRAVENOUS

## 2020-08-10 MED ORDER — HYDROCODONE-ACETAMINOPHEN 5-325 MG PO TABS
1.0000 | ORAL_TABLET | Freq: Once | ORAL | Status: AC
  Administered 2020-08-10: 1 via ORAL
  Filled 2020-08-10: qty 1

## 2020-08-10 MED ORDER — DOXYCYCLINE HYCLATE 100 MG PO CAPS: 100.0000 mg | ORAL_CAPSULE | Freq: Two times a day (BID) | ORAL | 0 refills | Status: DC

## 2020-08-10 NOTE — ED Triage Notes (Signed)
Pt arrives po v with c/o shob x 5 days, endorses needing 2L o2 at home more freq., using inhaler with no relief. Endorses back pain

## 2020-08-10 NOTE — ED Notes (Signed)
BSC set up for pt; pt prefers not to use Purewick because it has not been effective in the past.

## 2020-08-10 NOTE — ED Notes (Signed)
Patient assessed upon arrival to room. BBS clear, SAT 94-95% on RA. Stated she wears o2 PRN at home and felt like she needed it all night last night. RT to monitor as needed

## 2020-08-10 NOTE — ED Provider Notes (Signed)
[facility] EMERGENCY DEPARTMENT Provider Note   CSN: [id number] Arrival date & time: 08/10/20  5852     History Chief Complaint  Patient presents with   Shortness of Breath    [name] [name] is a 75 y.o. female.  HPI 75 year old female presents with shortness of breath and back pain.  This has been ongoing for several days.  She has a nonproductive cough, shortness of breath, and the left thoracic back pain.  Her chest hurts only when she takes a deep breath.  She has tried albuterol every 4 hours with no relief.  She is been having to use her as needed oxygen at night.  She is tried Tylenol for the back pain and its not helping.  She has had similar symptoms with pneumonia before.  No fevers or leg swelling.  Past Medical History:  Diagnosis Date   Anemia    hx   Anxiety    Asthma    Basal cell carcinoma 05/2014   "left shoulder"   Bundle branch block, left    chronic/notes 07/18/2013   CHF (congestive heart failure) ([place])    Chronic insomnia 05/06/2015   Chronic kidney disease    frequency, sees dr Jamal Maes every 4 to 6 months (01/16/2018)   Chronic lower back pain    Claustrophobia    Common migraine 05/14/2014   Coronary artery disease    MI in 2001, 2002, 2006, 2011, 2014   Depression    Diabetic peripheral neuropathy ([place]) 01/12/2019   GERD (gastroesophageal reflux disease)    H/O hiatal hernia    Headache    "at least 2/month" (01/16/2018)   Heart murmur    Hyperlipidemia    Hypertension    Memory change 05/14/2014   Migraine    "5-6/year"  (01/16/2018)   Obesity 01-2010   Obstructive sleep apnea    "can't wear machine; I have claustrophobia" (01/16/2018), states she had a 2nd sleep study and does not have sleep apnea, her O2 decreases and now is on 2 L of O2 at night.   On home oxygen therapy    "2L at night and prn during daytime" (01/16/2018)   Osteoarthritis    "knees and hands" (01/16/2018)   Peripheral vascular disease ([place])    ?  numbness, tingling arms and legs   PONV (postoperative nausea and vomiting)    Stroke ([place])    2014, 2015, 2016   Type II diabetes mellitus ([place])    Ventral hernia    hx of    Patient Active Problem List   Diagnosis Date Noted   Symptomatic bradycardia 01/16/2019   Normocytic anemia 01/16/2019   Diabetic peripheral neuropathy ([place]) 01/12/2019   Neurologic abnormality 12/25/2018   Debility 09/21/2018   Hypotension 09/18/2018   Bradycardia 09/18/2018   AKI (acute kidney injury) ([place]) 09/18/2018   Pressure injury of skin 09/18/2018   Ischemic cerebrovascular accident (CVA) of frontal lobe ([place]) 09/13/2018   Fall 09/09/2018   History of CVA (cerebrovascular accident) 09/09/2018   Type II diabetes mellitus with renal manifestations ([place]) 09/09/2018   CKD (chronic kidney disease), stage III 09/09/2018   Sepsis ([place]) 09/09/2018   Depression with anxiety 09/09/2018   Slurred speech 09/09/2018   Ankle fracture, left 05/16/2018   Amiodarone pulmonary toxicity    Physical deconditioning    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    Acute on chronic congestive heart failure ([place])    Chronic combined systolic (congestive) and diastolic (congestive) heart failure ([place]) 01/16/2018  Chronic systolic CHF (congestive heart failure) ([place]) 01/16/2018   Atrial fibrillation ([place]) 10/21/2017   CAD (coronary artery disease) of bypass graft 10/20/2017   Coronary artery disease of bypass graft of native heart with stable angina pectoris (HCC)    Chronic low back pain 08/24/2016   Pain of left thumb 08/05/2016   Nocturnal hypoxia 06/02/2016   Hoarseness 04/05/2016   Laryngopharyngeal reflux (LPR) 04/05/2016   Pharyngoesophageal dysphagia 04/05/2016   Angina decubitus ([place]) 05/22/2015   Chronic insomnia 05/06/2015   Common migraine 05/14/2014   Abnormality of gait 05/14/2014   Memory change 05/14/2014   Aneurysm, cerebral, nonruptured 05/14/2014   Cramp of limb-Left neck 05/10/2014   Hematemesis with  nausea    Vomiting blood    Dizziness and giddiness 09/21/2013   Atypical chest pain 07/18/2013   Unstable angina (HCC) 04/09/2013   Carotid artery stenosis, symptomatic 03/20/2013   Cerebrovascular disease 07/10/2012   Cerebral artery occlusion with cerebral infarction ([place]) 07/10/2012   Mitral regurgitation 04/15/2012   TIA (transient ischemic attack) 04/15/2012   Occlusion and stenosis of carotid artery without mention of cerebral infarction 08/18/2011   Bariatric surgery status 06/17/2011   Speech abnormality 03/22/2011   Dyspnea 02/24/2011   PAPILLARY MUSCLE DYSFUNCTION, NON-RHEUMATIC 10/09/2008   UNSPECIFIED VITAMIN D DEFICIENCY 10/24/2007   MYOCARDIAL INFARCTION, HX OF 10/24/2007   PERSISTENT VOMITING 10/24/2007   OSTEOARTHRITIS 10/24/2007   MIGRAINES, HX OF 10/24/2007   Diabetes mellitus type 2, controlled, with complications ([place]) 91/50/5697   Hyperlipemia 01/16/2007   Obesity-post failed open gastroplasty 1984  01/16/2007   OBSTRUCTIVE SLEEP APNEA 01/16/2007   Essential hypertension 01/16/2007   Coronary atherosclerosis 01/16/2007   Asthma 01/16/2007   GERD 01/16/2007   VENTRAL HERNIA 01/16/2007    Past Surgical History:  Procedure Laterality Date   ANKLE FRACTURE SURGERY Left 1970's   APPENDECTOMY  1970's   w/hysterectomy   BASAL CELL CARCINOMA EXCISION Left 05/2014   "shoulder" (01/16/2018)   CARDIAC CATHETERIZATION  10/10/2012   Dr [name].   CARDIAC CATHETERIZATION N/A 05/29/2015   Procedure: Right/Left Heart Cath and Coronary/Graft Angiography;  Surgeon: Larey Dresser, MD;  Location: [facility] CV LAB;  Service: Cardiovascular;  Laterality: N/A;   CAROTID ENDARTERECTOMY Left 03/2013   CAROTID STENT INSERTION Left 03/20/2013   Procedure: CAROTID STENT INSERTION;  Surgeon: [name], MD;  Location: [facility] CATH LAB;  Service: Cardiovascular;  Laterality: Left;  internal carotid   CEREBRAL ANGIOGRAM N/A 04/05/2011   Procedure: CEREBRAL ANGIOGRAM;  Surgeon:  Angelia Mould, MD;  Location[facility] C[facility] LAB;  Service: Cardiovascular;  Laterality: N/A;   CHOLECYSTECTOMY OPEN  2004   CORONARY ANGIOPLASTY WITH STENT PLACEMENT  01,02,05,06,07,08,11; 04/24/2013   "I've probably got ~ 10 stents by now" (04/24/2013)   CORONARY ANGIOPLASTY WITH STENT PLACEMENT  06/13/2013   "got 4 stents today" (06/13/2013)   CORONARY ARTERY BYPASS GRAFT  1220/11   "CABG X5"   CORONARY STENT INTERVENTION N/A 10/20/2017   Procedure: CORONARY STENT INTERVENTION;  Surgeon: Troy Sine, MD;  Location: [facility] CV LAB;  Service: Cardiovascular;  Laterality: N/A;   ESOPHAGOGASTRODUODENOSCOPY  08/03/2011   Procedure: ESOPHAGOGASTRODUODENOSCOPY (EGD);  Surgeon: Shann Medal, MD;  Location: [facility];  Service: General;  Laterality: N/A;   ESOPHAGOGASTRODUODENOSCOPY (EGD) WITH PROPOFOL N/A 03/11/2014   Procedure: ESOPHAGOGASTRODUODENOSCOPY (EGD) WITH PROPOFOL;  Surgeon: Lafayette Dragon, MD;  Location: [facility] ENDOSCOPY;  Service: Endoscopy;  Laterality: N/A;   FRACTURE SURGERY     gall stone removal  05/2003   GASTRIC RESTRICTION SURGERY  1984   "stapeling"   HERNIA REPAIR  2004   "in my stomach; had OR on it twice", wire mesh on 1 hernia   LEFT HEART CATH AND CORS/GRAFTS ANGIOGRAPHY N/A 07/09/2016   Procedure: LEFT HEART CATH AND CORS/GRAFTS ANGIOGRAPHY;  Surgeon: Larey Dresser, MD;  Location: [facility] LAB;  Service: Cardiovascular;  Laterality: N/A;   ORIF ANKLE FRACTURE Left 05/16/2018   ORIF ANKLE FRACTURE Left 05/16/2018   Procedure: OPEN REDUCTION INTERNAL FIXATION (ORIF) Left ankle with possible syndesmosis fixation;  Surgeon: Nicholes Stairs, MD;  Location: [facility];  Service: Orthopedics;  Laterality: Left;  144min   OVARY SURGERY  1970's   "tumor removed"   PERCUTANEOUS CORONARY STENT INTERVENTION (PCI-S) N/A 06/13/2013   Procedure: PERCUTANEOUS CORONARY STENT INTERVENTION (PCI-S);  Surgeon: [name], MD;  Location: [facility] CATH LAB;  Service:  Cardiovascular;  Laterality: N/A;   PERCUTANEOUS STENT INTERVENTION N/A 04/24/2013   Procedure: PERCUTANEOUS STENT INTERVENTION;  Surgeon: [name], MD;  Location: [facility] CATH LAB;  Service: Cardiovascular;  Laterality: N/A;   RIGHT/LEFT HEART CATH AND CORONARY/GRAFT ANGIOGRAPHY N/A 10/20/2017   Procedure: RIGHT/LEFT HEART CATH AND CORONARY/GRAFT ANGIOGRAPHY;  Surgeon: Larey Dresser, MD;  Location: [facility] CV LAB;  Service: Cardiovascular;  Laterality: N/A;   ROOT CANAL  10/2000   TEE WITHOUT CARDIOVERSION N/A 01/18/2019   Procedure: TRANSESOPHAGEAL ECHOCARDIOGRAM (TEE);  Surgeon: Larey Dresser, MD;  Location: [facility] ENDOSCOPY;  Service: Cardiovascular;  Laterality: N/A;   TIBIA FRACTURE SURGERY Right 1970's   rods and pins   TOOTH EXTRACTION     "1 on the upper; wisdom tooth on the lower" (01/16/2018)   TOTAL ABDOMINAL HYSTERECTOMY  1970's   w/ appendectomy     OB History   No obstetric history on file.     Family History  Problem Relation Age of Onset   Heart disease Father        Heart Disease before age 43   Diabetes Father    Hyperlipidemia Father    Hypertension Father    Heart attack Father    Deep vein thrombosis Father    AAA (abdominal aortic aneurysm) Father    Hypertension Mother    Deep vein thrombosis Mother    AAA (abdominal aortic aneurysm) Mother    Cancer Sister        Ovarian   Hypertension Sister    Diabetes Paternal Grandmother    Heart disease Paternal Uncle    Diabetes Paternal Uncle    Heart attack Paternal Grandfather 18       died of MI at 17   Diabetes Paternal Aunt    Diabetes Paternal Uncle    Colon cancer Neg Hx     Social History   Tobacco Use   Smoking status: Never   Smokeless tobacco: Never  Vaping Use   Vaping Use: Never used  Substance Use Topics   Alcohol use: Never   Drug use: Never    Home Medications Prior to Admission medications   Medication Sig Start Date End Date Taking? Authorizing Provider   doxycycline (VIBRAMYCIN) 100 MG capsule Take 1 capsule (100 mg total) by mouth 2 (two) times daily. One po bid x 7 days 08/10/20  Yes Sherwood Gambler, MD  acetaminophen (TYLENOL) 500 MG tablet Take 500 mg by mouth every 6 (six) hours as needed for mild pain, moderate pain, fever or headache.    [provider]  albuterol (  VENTOLIN HFA) 108 (90 Base) MCG/ACT inhaler Inhale 2 puffs into the lungs every 4 (four) hours as needed for wheezing or shortness of breath.     [provider]  Alirocumab (PRALUENT) 150 MG/ML SOAJ Inject 150 mg/mL into the skin every 14 (fourteen) days.    [provider]  ALPRAZolam Duanne Moron) 0.5 MG tablet Take 1-2 tablets (0.5-1 mg total) by mouth See admin instructions. Take one tablet (0.5 mg) by mouth every morning and two tablets (1 mg) at night, may also take 1 tablet (0.5 mg) midday as needed for anxiety 01/19/19   Aline August, MD  amLODipine (NORVASC) 10 MG tablet TAKE ONE (1) TABLET BY MOUTH EACH DAY 08/06/20   Larey Dresser, MD  B Complex-C (B-COMPLEX WITH VITAMIN C) tablet Take 1 tablet by mouth daily.    [provider]  bisacodyl (DULCOLAX) 5 MG EC tablet Take 5 mg by mouth daily as needed for moderate constipation.    [provider]  buPROPion (WELLBUTRIN XL) 150 MG 24 hr tablet Take 150 mg by mouth every morning. 01/25/19   [provider]  cholecalciferol (VITAMIN D3) 25 MCG (1000 UNIT) tablet Take 1,000 Units by mouth daily.    [provider]  clopidogrel (PLAVIX) 75 MG tablet TAKE ONE TABLET BY MOUTH AT BEDTIME 08/06/20   Larey Dresser, MD  denosumab (PROLIA) 60 MG/ML SOSY injection Inject 60 mg into the skin every 6 (six) months.     [provider]  donepezil (ARICEPT) 5 MG tablet Take 1 tablet (5 mg total) by mouth at bedtime. 05/27/20   Kathrynn Ducking, MD  DULoxetine (CYMBALTA) 30 MG capsule Take 1 capsule (30 mg total) by mouth daily. 09/28/18   Angiulli, Lavon Paganini, PA-C  ELIQUIS 5  MG TABS tablet TAKE ONE TABLET BY MOUTH TWICE DAILY 08/06/20   Larey Dresser, MD  ezetimibe (ZETIA) 10 MG tablet Take 1 tablet (10 mg total) by mouth at bedtime. 04/10/18   Cherene Altes, MD  Fluticasone-Salmeterol (ADVAIR) 250-50 MCG/DOSE AEPB Inhale 1 puff into the lungs daily.    [provider]  gabapentin (NEURONTIN) 600 MG tablet Take 600 mg by mouth 4 (four) times daily as needed. 07/04/19   [provider]  Insulin Glargine (LANTUS SOLOSTAR) 100 UNIT/ML Solostar Pen Inject 38-44 Units into the skin See admin instructions. Inject 44 units subcutaneously daily at bedtime, increase to 44 units for CBG >200    [provider]  insulin lispro (HUMALOG) 100 UNIT/ML KwikPen Inject 14-20 Units into the skin See admin instructions. Inject 14 units subcutaneously prior to breakfast and supper; add 4 units for CBG >200    [provider]  ipratropium-albuterol (DUONEB) 0.5-2.5 (3) MG/3ML SOLN Take 3 mLs by nebulization 2 (two) times daily as needed (asthma). 01/19/19   Aline August, MD  methocarbamol (ROBAXIN) 500 MG tablet Take 500 mg by mouth 3 (three) times daily as needed. 05/25/19   [provider]  Multiple Vitamin (MULTIVITAMIN WITH MINERALS) TABS tablet Take 1 tablet by mouth every morning. Centrum - Women over 61    [provider]  Multiple Vitamins-Minerals ([place]) CAPS Take 1 capsule by mouth every morning.    [provider]  nitroGLYCERIN (NITROSTAT) 0.3 MG SL tablet Place 1 tablet (0.3 mg total) under the tongue every 5 (five) minutes x 3 doses as needed for chest pain. 05/13/20   Larey Dresser, MD  ondansetron (ZOFRAN-ODT) 4 MG disintegrating tablet  Take 4 mg by mouth 2 (two) times daily as needed for nausea or vomiting.    [provider]  OXYGEN Inhale 2 L into the lungs at bedtime as needed (shortness of breath).     [provider]  pantoprazole (PROTONIX) 40 MG tablet Take 1  tablet (40 mg total) by mouth daily. 09/28/18   Angiulli, Lavon Paganini, PA-C  Polyvinyl Alcohol-Povidone (REFRESH OP) Place 1 drop into both eyes daily as needed (redness/ dry eyes).    [provider]  potassium chloride SA (KLOR-CON) 20 MEQ tablet TAKE 2 TABLETS BY MOUTH EVERY MORNING AND 1 TABLET EVERY EVENING 03/21/20   Larey Dresser, MD  Pyridoxine HCl (VITAMIN B-6 PO) Take 1 tablet by mouth every morning.    [provider]  ranolazine (RANEXA) 1000 MG SR tablet TAKE ONE TABLET BY MOUTH TWICE DAILY 08/06/20   Larey Dresser, MD  rosuvastatin (CRESTOR) 40 MG tablet Take 1 tablet (40 mg total) by mouth at bedtime. 09/28/18   Angiulli, Lavon Paganini, PA-C  sacubitril-valsartan (ENTRESTO) 49-51 MG Take 1 tablet by mouth 2 (two) times daily. 06/19/20   Larey Dresser, MD  spironolactone (ALDACTONE) 25 MG tablet TAKE 1 TABLET BY MOUTH DAILY 02/21/20   Larey Dresser, MD  torsemide (DEMADEX) 20 MG tablet Take 4 tablets (80 mg total) by mouth 2 (two) times daily. 06/03/20   Larey Dresser, MD  traZODone (DESYREL) 50 MG tablet Take 1 tablet (50 mg total) by mouth at bedtime. 11/20/19   Suzzanne Cloud, NP  triamcinolone cream (KENALOG) 0.1 % Apply 1 application topically daily as needed (crusty spots on arms).    [provider]  vitamin B-12 (CYANOCOBALAMIN) 1000 MCG tablet Take 1,000 mcg by mouth every morning.     [provider]  zolpidem (AMBIEN) 10 MG tablet Take 0.5 tablets (5 mg total) by mouth at bedtime. 12/26/18   Geradine Girt, DO    Allergies    Amoxicillin, Brilinta [ticagrelor], Erythromycin, Flagyl [metronidazole], Penicillins, Isosorbide mononitrate [isosorbide nitrate], Jardiance [empagliflozin], Metformin and related, Tape, and Erythromycin base  Review of Systems   Review of Systems  Constitutional:  Negative for fever.  Respiratory:  Positive for cough and shortness of breath.   Cardiovascular:  Positive for chest pain. Negative for leg swelling.   Gastrointestinal:  Negative for abdominal pain.  Musculoskeletal:  Positive for back pain.  All other systems reviewed and are negative.  Physical Exam Updated Vital Signs BP (!) 139/49   Pulse 64   Temp 99.2 F (37.3 C)   Resp 18   Ht 5\' 2"  (1.575 m)   Wt 97.5 kg   SpO2 95%   BMI 39.32 kg/m   Physical Exam Vitals and nursing note reviewed.  Constitutional:      Appearance: She is well-developed. She is not ill-appearing or diaphoretic.  HENT:     Head: Normocephalic and atraumatic.     Right Ear: External ear normal.     Left Ear: External ear normal.     Nose: Nose normal.  Eyes:     General:        Right eye: No discharge.        Left eye: No discharge.  Cardiovascular:     Rate and Rhythm: Normal rate and regular rhythm.     Heart sounds: Normal heart sounds.  Pulmonary:     Effort: Pulmonary effort is normal. No accessory muscle usage or respiratory distress.  Breath sounds: Normal breath sounds.  Abdominal:     Palpations: Abdomen is soft.     Tenderness: There is no abdominal tenderness.  Musculoskeletal:     Thoracic back: Tenderness present.       Back:     Right lower leg: No edema.     Left lower leg: No edema.  Skin:    General: Skin is warm and dry.  Neurological:     Mental Status: She is alert.  Psychiatric:        Mood and Affect: Mood is not anxious.    ED Results / Procedures / Treatments   Labs (all labs ordered are listed, but only abnormal results are displayed) Labs Reviewed  BASIC METABOLIC PANEL - Abnormal; Notable for the following components:      Result Value   Glucose, Bld 232 (*)    Calcium 8.7 (*)    All other components within normal limits  CBC - Abnormal; Notable for the following components:   WBC 16.7 (*)    All other components within normal limits  D-DIMER, QUANTITATIVE - Abnormal; Notable for the following components:   D-Dimer, Quant 1.10 (*)    All other components within normal limits  BRAIN NATRIURETIC  PEPTIDE - Abnormal; Notable for the following components:   B Natriuretic Peptide 469.3 (*)    All other components within normal limits  TROPONIN I (HIGH SENSITIVITY) - Abnormal; Notable for the following components:   Troponin I (High Sensitivity) 24 (*)    All other components within normal limits  TROPONIN I (HIGH SENSITIVITY) - Abnormal; Notable for the following components:   Troponin I (High Sensitivity) 24 (*)    All other components within normal limits  RESP PANEL BY RT-PCR (FLU A&B, COVID) [id number]    EKG EKG Interpretation  Date/Time:  Sunday August 10 2020 10:12:45 EDT Ventricular Rate:  78 PR Interval:  151 QRS Duration: 174 QT Interval:  452 QTC Calculation: 515 R Axis:   149 Text Interpretation: Sinus rhythm Probable left atrial enlargement IVCD, consider atypical LBBB Confirmed by Sherwood Gambler [id number]) on 08/10/2020 10:32:35 AM  Radiology CT Angio Chest PE W and/or Wo Contrast  Result Date: 08/10/2020 CLINICAL DATA:  Shortness of breath for 5 days. EXAM: CT ANGIOGRAPHY CHEST WITH CONTRAST TECHNIQUE: Multidetector CT imaging of the chest was performed using the standard protocol during bolus administration of intravenous contrast. Multiplanar CT image reconstructions and MIPs were obtained to evaluate the vascular anatomy. CONTRAST:  85 mL OMNIPAQUE IOHEXOL 350 MG/ML SOLN COMPARISON:  Single-view of the chest today.  CT chest 04/03/2018. FINDINGS: Cardiovascular: The pulmonary arterial tree is well opacified. No pulmonary embolus is seen. There is cardiomegaly. The main pulmonary artery measures 3.6 cm, unchanged. The patient is status post CABG. Extensive calcific atherosclerosis. No aortic aneurysm. Mediastinum/Nodes: No lymphadenopathy. The thyroid gland is heterogeneous without focal lesion. Esophagus is negative. Lungs/Pleura: Small bilateral pleural effusions. Patchy bilateral ground-glass attenuation is seen. No consolidative process, nodule mass. Upper Abdomen: Status  post cholecystectomy.  Otherwise negative. Musculoskeletal: No acute or focal abnormality. Review of the MIP images confirms the above findings. IMPRESSION: Negative for pulmonary embolus. Patchy bilateral ground-glass attenuation is likely due to interstitial pulmonary edema in this patient with cardiomegaly and small pleural effusions. Dilated main pulmonary artery compatible with pulmonary arterial hypertension, unchanged. Aortic Atherosclerosis (ICD10-I70.0). Electronically Signed   By: Inge Rise M.D.   On: 08/10/2020 12:33   DG Chest Portable 1 View  Result Date: 08/10/2020 CLINICAL  DATA:  Shortness of breath 5 days.  Oxygen dependent. EXAM: PORTABLE CHEST 1 VIEW COMPARISON:  07/12/2019 FINDINGS: Median sternotomy wires unchanged. Lungs are adequately inflated without lobar consolidation or effusion. Cardiomediastinal silhouette and remainder of the exam is unchanged. IMPRESSION: No acute cardiopulmonary disease. Electronically Signed   By: Marin Olp M.D.   On: 08/10/2020 11:25    Procedures Procedures   Medications Ordered in ED Medications  HYDROcodone-acetaminophen (NORCO/VICODIN) 5-325 MG per tablet 1 tablet (1 tablet Oral Given 08/10/20 1056)  iohexol (OMNIPAQUE) 350 MG/ML injection 100 mL (85 mLs Intravenous Contrast Given 08/10/20 1151)  furosemide (LASIX) injection 80 mg (80 mg Intravenous Given 08/10/20 1340)    ED Course  I have reviewed the triage vital signs and the nursing notes.  Pertinent labs & imaging results that were available during my care of the patient were reviewed by me and considered in my medical decision making (see chart for details).    MDM Rules/Calculators/A&P                          No wheezing noted on exam.  She is not hypoxic in the emergency department.  She has mildly elevated troponins but these are flat and consistent with priors.  CHF versus atypical pneumonia.  Her history would indicate it might be more of a CHF given her prior  exacerbations of this but her elevated WBC is concerned this might be an atypical pneumonia.  Thus we will give antibiotics but also increase her diuretics for a couple days.  She otherwise appears stable for discharge home.  CT scan shows no PE.  Discussed return precautions. Final Clinical Impression(s) / ED Diagnoses Final diagnoses:  Shortness of breath    Rx / DC Orders ED Discharge Orders          Ordered    doxycycline (VIBRAMYCIN) 100 MG capsule  2 times daily        08/10/20 1443             Sherwood Gambler, MD 08/10/20 1459

## 2020-08-10 NOTE — Discharge Instructions (Addendum)
Your scan could be pneumonia versus fluid in the lungs.  You are being prescribed antibiotics and you need to increase your torsemide from twice a day to 3 times a day for the next 3 days only.  Then go back to normal.  If at any point you develop new or worsening shortness of breath, develop coughing up blood, chest pain, or any other new/concerning symptoms then return to the ER for evaluation.

## 2020-08-11 NOTE — Progress Notes (Signed)
  ADVANCED HEART FAILURE CLINIC NOTE   Date:  08/12/2020   ID:  [name] [name], DOB 08/20/1945, [name] [id number]   Provider location: [facility] Type of Visit: Established patient   PCP:  Blomgren, Peter, MD  Cardiologist:  Dr. McLean  Chief Complaint: Follow up for HF & exertional shortness of breath   History of Present Illness: [name] [name] is a 74 y.o. female who has a history of CAD s/p CABG, diastolic CHF, and cerebrovascular disease with history of CVA presents for followup of CHF and diastolic CHF. She had CABG x 5 in 12/11.  Prior to the CABG she had multiple PCIs.  She had a CVA in 2/14 that presented as visual loss.  She had a left carotid stent in 2/15.    Left heart cath done in Sept. 2014. This showed patent SVG-D, LIMA-LAD, and sequential SVG-OM branches but SVG-RCA and the native RCA were both totally occluded. It was felt aht her increased symptoms coincided with occlusion of SVG-RCA.  She was started on Imdur to see if this would help with the chest pain and dyspnea.  However, she feels like Imdur causes leg cramps and does not think that she can take it.  So ranolazine 500 mg bid started and titrated this up to 1000 mg bid.  This helped some but not markedly.  Therefore, sent to  Dr. Varanasi to address opening RCA CTO. He was able to do this in 5/15 with 4 overlapping Xience DES.  This led to resolution of exertional chest pain.     [name] [name] was started on Brilinta after PCI.  She became much more short of breath after starting on Brilinta. Per Dr McLean Brilinta stopped and replaced with Plavix.  Dyspnea improved off Brilinta. Given increasing exertional dyspnea and chest pain,  RHC/LHC was done in 4/17.  This showed stable CAD with no interventional target.  Left and right heart filling pressures were not significantly increased.  Medical management.    She had an MRI/MRA in May 2018 that showed moderately severe stenosis of the supraclinoid segment  of the right internal carotid artery, progressed since the 2016 MR angiogram and severe basilar artery stenosis.    She developed recurrent exertional chest pain and had LHC in 6/18, showing 4/5 grafts patent with patent native RCA (similar to prior cath, no changes).     Echo 1/19 with EF 55-60%, moderate diastolic dysfunction, PASP 50 mmHg, mildly dilated RV, mild AS, mild-moderate MR.    She reported increased dyspnea and chest pain and had RHC/LHC in 9/19.  She had DES to sequential SVG-OM1/PLOM.  While hospitalized, she was noted to have runs of atrial fibrillation.  She was very symptomatic with the atrial fibrillation.  Eliquis and amiodarone were started.    She was admitted 12/16-12/19/19 for cardiomems implant and RHC. She was diuresed with IV lasix and transitioned to torsemide 100 mg BID + metolazone once/week. DC weight: 295 lbs.   She had a prolonged hospitalization in 2/20 with acute respiratory failure and fever to 103.  She was thought to have PNA and treated with cefepime.  She was intubated.  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  Amiodarone was stopped and she was put on prednisone. Echo in 2/20 showed EF down to 25-30% with mid-apical LV severe hypokinesis. She had AKI. Possible ITP triggered by infection, HIT negative.  ITP was treated with Solumedrol. She was discharged to SNF.      ORIF left ankle 4/20.   11/20 admission initially with concern for TIAs (staring spells), ended up thinking possible partial seizures.  Head MRI did not show CVA.  Echo in 11/20 showed EF 45-50%, moderately decreased RV systolic function, moderate RV enlargement, moderate-severe MR, moderate TR.   Patient was admitted again in 12/20 with AKI and hypotension in the setting of sinus bradycardia (HR 30s).  She was volume overloaded on exam.  Toprol XL was stopped and HR increased to 60s.  She was diuresed with IV Lasix.  TEE in 12/20 showed EF 50-55%, septal-lateral  dyssynchrony, mildly decreased RV function, moderate MR.   Atypical chest pain in 1/21, Cardiolite showed EF 57%, fixed inferior defect (likely artifact), no ischemia.   She returned 4/22 for followup of CHF and CAD.  Still not using Cardiomems.  Weight is stable.  Uses rolling walker for balance. Short of breath with longer walks and stairs.  Occasional palpitations.  She will get random, nonexertional chest pain several times/week.  Pain will last 2-10 minutes.  This has been going on for years.  No lightheadedness. No falls.  No orthopnea/PND.   Today she returns for HF follow up. Has not used Cardiomems since May. More SOB and struggling with back pain for past 7 days. Went to ED a couple days ago. There was concern for pneumonia vs. CHF (worsening SOB and elevated WBC count), and was given prescription for doxycycline w/ instructions to increase diuretics for a couple of days. Still feeling poor. Using albuterol inhaler every 4 hours with little relief. Dizziness with head movements, however she says she recently had cataract surgery and is not sure if her symptoms are from that.. Denies increasing CP, edema, or PND/Orthopnea. No fever or chills. Weight at home 210-214 pounds. Taking all medications. Wearing 2L of oxygen at night for past week.   Cardiomems (personally reviewed): no transmission since May 2022.  ECG (personally reviewed): NSR, LBBB 168 msec.  Labs (6/14): K 3.8, creatinine 0.71 Labs (8/14): BNP 249, LDL 166 Labs (9/14): K 4.7, creatinine 0.7 Labs (9/14): K 4.6, creatinine 0.9, BNP 109 Labs (1/15): K 4.5, creatinine 0.73, LDL 212, HCT 43.4, TSH normal, BNP 138 Labs (6/15): K 4.7, creatinine 1.17, LDL 100, HDL 37, TGs 363, BNP 39 Labs (10/15): K 4.6, creatinine 1.2 Labs (1/16): LDL 157, HDL 43, K 5.2, creatinine 0.95 Labs (2/16): K 4.5, creatinine 1.08, BNP 113 Labs (4/16): LDL 50, HDL 56, TGs 179 Labs (9/16): K 5.3, creatinine 1.09 Labs (10/16): LDL 75, HDL 56 Labs  (2/17): K 5.2, creatinine 1.07 Labs (4/17): K 5.1, creatinine 1.07 Labs (5/17): K 4.5, creatinine 1.01, LDL 96, HDL 56, BNP 70 Labs (03/2016) K 4.9, creatinine 1.07 Labs (7/18): K 4.9, creatinine 1.15 Labs (11/18): K 5.1, creatinine 1.11, LDL 36 Labs (1/19): K 4.9, creatinine 1.22 Labs (9/19): LDL 108 Labs (10/19): hgb 14.5, K 4.3, creatinine 1.03 Labs 12/06/2017: K 4.7 Creatinine 1.7 Labs (3/20): K 4.8, creatinine 1.39, hgb 12.4 plts 226 Labs (11/20): K 4.2, creatinine 1.01, LDL 38 Labs (12/20): K 3.5, creatinine 1.8 Labs (3/21): K 4.5, creatinine 1.5 Labs (2/22): K 4.2, creatinine 1.13 Labs (4/22): K 4.2, creatinine 1.01, LDL 58   PMH: 1. Diabetic gastroparesis 2. Type II diabetes 3. HTN 4. Morbid obesity 5. CAD: s/p CABG in 12/11 after prior PCIs.  LIMA-LAD, SVG-D, seq SVG-OM1 and OM2, SVG-PDA. Adenosine Cardiolite (8/14) with EF 53% and a small reversible apical defect with a medium, partially reversible inferior defect.  LHC (  9/14) with patent SVG-D, LIMA-LAD (50% distal LAD), and sequential SVG-OM branches; the SVG-RCA and the native RCA were occluded.  This was managed medically initially, but with ongoing exertional chest pain, it was decided to open CTO.  Patient had CTO opening with 4 overlapping Xience DES in the RCA in 5/15.   - LHC (4/17): SVG-D patent, LIMA-LAD patent, distal LAD with several 40-50% stenoses, sequential SVG-OM1 and PLOM patent with 50% proximal stenosis (not flow limiting), SVG-RCA TO with patent RCA stents.  - LHC (6/18): 4/5 grafts patent (SVG-RCA TO, same as past), RCA stents patent => no change.  - LHC (9/19): Sequential SVG-PLOM/OM1 with 80% proximal stenosis, s/p DES.  - Cardiolite (1/21): EF 57%, fixed inferior defect (likely artifact), no ischemia.  6. Atypical migraines 7. OHS/OSA: Intolerant of CPAP. Uses oxygen with exertion and at night.  8. GERD with hiatal hernia 9. OA 10. Chronic systolic CHF: Echo (11/13) with EF 50-55%, grade II diastolic  dysfunction, mild-moderate MR.  Echo (8/14) with EF 55-60%, grade II diastolic dysfunction, mildly increased aortic valve gradient (mean 12 mmHg) but valve opens well, mild MR and mild RV dilation.  Echo (9/15) with EF 60-65%, grade II diastolic dysfunction, mild aortic stenosis, mild mitral stenosis, mild to moderate mitral regurgitation, mildly dilated RV with normal systolic function, PA systolic pressure 46 mmHg.  - RHC (4/17): mean RA 9, PA 38/15 mean 27, mean PCWP 9, CI 2.5.  - Echo (5/17): EF 55-60%, mild LVH, mildly dilated RV with low normal systolic function, moderate TR, PASP 61 mmHg - Echo (1/19): EF 55-60%, moderate diastolic dysfunction, PASP 50 mmHg, mildly dilated RV, mild AS, mild-moderate MR. - RHC (9/19): mean RA 11, PA 34/12, mean PCWP 15, CI 2.85 - Echo (3/20): EF 25-30% with mid-apical severe hypokinesis.  - Echo (11/20): EF 45-50%, moderately dilated RV with moderately decreased systolic function, moderate-severe MR, moderate TR.  - TEE (12/20): EF 50-55%, septal-lateral dyssynchrony, moderate RV dilation/mildly decreased function, peak RV-RA gradient 51 mmHg, moderate MR.  11. CKD 12. Chronic LBBB 13. Anxiety 14. Carotid stenosis: Followed by [facility], >80% LICA stenosis 12/14.  She had left carotid stent in 2/15. Carotid dopplers 8/15 with no significant disease. Carotid dopplers (4/16): < 40% RICA stenosis.  15. Cerebrovascular disease: CVA 2/14 with right posterior cerebral artery territory ischemic infarction. Cerebral angiogram in 6/14 showed 70% right vertebral ostial stenosis, 60-65% LICA stenosis, > 70% proximal left posterior cerebral artery stenosis, posterior communicating artery aneurysm.  Patient has had episodes of transient expressive aphasia.  Carotid dopplers (12/14) showed >80% LICA stenosis. She had a left carotid stent 2/15.  Possible CVA in 7/15. -  MRI/MRA in May 2018 that showed moderately severe stenosis of the supraclinoid segment of the right internal carotid  artery, progressed since the 2016 MR angiogram and severe basilar artery stenosis.  16. Positional vertigo (suspected) 17. Palpitations: Holter (6/15) with rare PVCs/PACs.  - Event monitor (2/18): NSR, occasional PVCs 18. Dyspnea with Brilinta. 19. Melanoma: On face, s/p excision.   20. Aortic stenosis: Mild.  21. Holter (8/16) with no significant arrhythmias.  22. Lower extremity arterial dopplers (9/16) were normal.  23. Sleep study (4/18): No significant OSA.  24. Atrial fibrillation: Paroxysmal - Amiodarone stopped, ?lung toxicity.  25. H/o ITP in 3/20.  16. Partial seizures 17. Mitral regurgitation: Moderate on TEE in 12/20.    Current Outpatient Medications  Medication Sig Dispense Refill   acetaminophen (TYLENOL) 500 MG tablet Take 500 mg by mouth every 6 (  six) hours as needed for mild pain, moderate pain, fever or headache.     albuterol (VENTOLIN HFA) 108 (90 Base) MCG/ACT inhaler Inhale 2 puffs into the lungs every 4 (four) hours as needed for wheezing or shortness of breath.      Alirocumab (PRALUENT) 150 MG/ML SOAJ Inject 150 mg/mL into the skin every 14 (fourteen) days.     ALPRAZolam (XANAX) 0.5 MG tablet Take 1-2 tablets (0.5-1 mg total) by mouth See admin instructions. Take one tablet (0.5 mg) by mouth every morning and two tablets (1 mg) at night, may also take 1 tablet (0.5 mg) midday as needed for anxiety     amLODipine (NORVASC) 10 MG tablet TAKE ONE (1) TABLET BY MOUTH EACH DAY 90 tablet 3   B Complex-C (B-COMPLEX WITH VITAMIN C) tablet Take 1 tablet by mouth daily.     bisacodyl (DULCOLAX) 5 MG EC tablet Take 5 mg by mouth daily as needed for moderate constipation.     buPROPion (WELLBUTRIN XL) 150 MG 24 hr tablet Take 150 mg by mouth every morning.     cholecalciferol (VITAMIN D3) 25 MCG (1000 UNIT) tablet Take 1,000 Units by mouth daily.     clopidogrel (PLAVIX) 75 MG tablet TAKE ONE TABLET BY MOUTH AT BEDTIME 90 tablet 3   denosumab (PROLIA) 60 MG/ML SOSY  injection Inject 60 mg into the skin every 6 (six) months.      donepezil (ARICEPT) 5 MG tablet Take 1 tablet (5 mg total) by mouth at bedtime. 30 tablet 0   doxycycline (VIBRAMYCIN) 100 MG capsule Take 1 capsule (100 mg total) by mouth 2 (two) times daily. One po bid x 7 days 14 capsule 0   DULoxetine (CYMBALTA) 30 MG capsule Take 1 capsule (30 mg total) by mouth daily. 30 capsule 0   ELIQUIS 5 MG TABS tablet TAKE ONE TABLET BY MOUTH TWICE DAILY 180 tablet 3   ezetimibe (ZETIA) 10 MG tablet Take 1 tablet (10 mg total) by mouth at bedtime.     Fluticasone-Salmeterol (ADVAIR) 250-50 MCG/DOSE AEPB Inhale 1 puff into the lungs daily.     gabapentin (NEURONTIN) 600 MG tablet Take 600 mg by mouth 4 (four) times daily as needed.     Insulin Glargine (LANTUS SOLOSTAR) 100 UNIT/ML Solostar Pen Inject 38-44 Units into the skin See admin instructions. Inject 44 units subcutaneously daily at bedtime, increase to 44 units for CBG >200     insulin lispro (HUMALOG) 100 UNIT/ML KwikPen Inject 14-20 Units into the skin See admin instructions. Inject 14 units subcutaneously prior to breakfast and supper; add 4 units for CBG >200     ipratropium-albuterol (DUONEB) 0.5-2.5 (3) MG/3ML SOLN Take 3 mLs by nebulization 2 (two) times daily as needed (asthma).     methocarbamol (ROBAXIN) 500 MG tablet Take 500 mg by mouth 3 (three) times daily as needed.     Multiple Vitamin (MULTIVITAMIN WITH MINERALS) TABS tablet Take 1 tablet by mouth every morning. Centrum - Women over 55     Multiple Vitamins-Minerals (OCUVITE EYE HEALTH FORMULA) CAPS Take 1 capsule by mouth every morning.     nitroGLYCERIN (NITROSTAT) 0.3 MG SL tablet Place 1 tablet (0.3 mg total) under the tongue every 5 (five) minutes x 3 doses as needed for chest pain. 25 tablet 1   ondansetron (ZOFRAN-ODT) 4 MG disintegrating tablet Take 4 mg by mouth 2 (two) times daily as needed for nausea or vomiting.     OXYGEN Inhale 2 L into the   lungs at bedtime as needed  (shortness of breath).      pantoprazole (PROTONIX) 40 MG tablet Take 1 tablet (40 mg total) by mouth daily. 30 tablet 0   Polyvinyl Alcohol-Povidone (REFRESH OP) Place 1 drop into both eyes daily as needed (redness/ dry eyes).     potassium chloride SA (KLOR-CON) 20 MEQ tablet TAKE 2 TABLETS BY MOUTH EVERY MORNING AND 1 TABLET EVERY EVENING 90 tablet 3   Pyridoxine HCl (VITAMIN B-6 PO) Take 1 tablet by mouth every morning.     ranolazine (RANEXA) 1000 MG SR tablet TAKE ONE TABLET BY MOUTH TWICE DAILY 180 tablet 3   rosuvastatin (CRESTOR) 40 MG tablet Take 1 tablet (40 mg total) by mouth at bedtime. 30 tablet 0   sacubitril-valsartan (ENTRESTO) 49-51 MG Take 1 tablet by mouth 2 (two) times daily. 180 tablet 3   spironolactone (ALDACTONE) 25 MG tablet TAKE 1 TABLET BY MOUTH DAILY 30 tablet 6   torsemide (DEMADEX) 20 MG tablet Take 4 tablets (80 mg total) by mouth 2 (two) times daily. 240 tablet 11   traZODone (DESYREL) 50 MG tablet Take 1 tablet (50 mg total) by mouth at bedtime. 90 tablet 1   triamcinolone cream (KENALOG) 0.1 % Apply 1 application topically daily as needed (crusty spots on arms).     vitamin B-12 (CYANOCOBALAMIN) 1000 MCG tablet Take 1,000 mcg by mouth every morning.      zolpidem (AMBIEN) 10 MG tablet Take 10 mg by mouth at bedtime as needed for sleep.     No current facility-administered medications for this encounter.    Allergies:   Amoxicillin, Brilinta [ticagrelor], Erythromycin, Flagyl [metronidazole], Penicillins, Isosorbide mononitrate [isosorbide nitrate], Jardiance [empagliflozin], Metformin and related, Tape, and Erythromycin base   Social History:  The patient  reports that she has never smoked. She has never used smokeless tobacco. She reports that she does not drink alcohol and does not use drugs.   Family History:  The patient's family history includes AAA (abdominal aortic aneurysm) in her father and mother; Cancer in her sister; Deep vein thrombosis in her  father and mother; Diabetes in her father, paternal aunt, paternal grandmother, paternal uncle, and paternal uncle; Heart attack in her father; Heart attack (age of onset: 73) in her paternal grandfather; Heart disease in her father and paternal uncle; Hyperlipidemia in her father; Hypertension in her father, mother, and sister.   ROS:  Please see the history of present illness.   All other systems are personally reviewed and negative.   Exam:   BP (!) 160/97   Pulse 76   Wt 94.3 kg (208 lb)   SpO2 93%   BMI 38.04 kg/m   General:  Appears mildly distressed. Occasional dyspnea w/ sentences, using rolling walker. HEENT: Normal Neck: Supple. No JVD. Carotids 2+ bilat; no bruits. No lymphadenopathy or thryomegaly appreciated. Cor: PMI nondisplaced. Regular rate & rhythm. No rubs, gallops, II/VI HSM LLSB Lungs: Wheezes upper lobes. Abdomen: Obese, nontender, nondistended. No hepatosplenomegaly. No bruits or masses. Good bowel sounds. Extremities: No cyanosis, clubbing, rash, edema Neuro: Alert & oriented x 3, cranial nerves grossly intact. Moves all 4 extremities w/o difficulty. Moderately anxious.  Recent Labs: 08/10/2020: B Natriuretic Peptide 469.3; BUN 11; Creatinine, Ser 0.87; Hemoglobin 14.0; Platelets 209; Potassium 3.9; Sodium 136  Personally reviewed   Wt Readings from Last 3 Encounters:  08/12/20 94.3 kg (208 lb)  08/10/20 97.5 kg (215 lb)  06/19/20 97.5 kg (215 lb)    ASSESSMENT AND PLAN:  1. CAD: Occluded native RCA and SVG-RCA.  Status post opening of CTO RCA with 4 overlapping Xience DES in 5/15.  DES to sequential SVG-OM1/PLOM in 9/19.  She has been on Plavix and Eliquis.  She has atypical chest pain, no change to pattern.  No ischemia on 1/21 Cardiolite.  - Continue on Plavix (neuro has wanted her to stay on this for cerebrovascular disease).   - She has not tolerated Imdur in the past. Off Toprol XL with bradycardia.  Continue ranolazine 1000 mg bid.  2. Chronic systolic  => diastolic CHF: Echo in 1/19 with EF 55-60%, moderate diastolic dysfunction. Cardiomems placed 01/16/18.  Echo in 3/20 in setting of severe PNA with intubation showed EF 25-30%, mid-apical LV severe hypokinesis.  Possible stress (Takotsubo-type) cardiomyopathy related to severe medical illness versus progression of CAD.  She has baseline chronic LBBB which could play a role in cardiomyopathy as well (LBBB cardiomyopathy).  Repeat echo in 11/20 with EF up to 45-50%, moderately dysfunctional RV.  TEE in 12/20 with EF 50-55% with septal-lateral dyssynchrony and mildly dysfunctional RV.  Echo 5/22 with EF 60-65%, calcified mass adjacent to mitral valve. Now, worsening NYHA class III-early IIIb symptoms, however ? If worsening dyspnea related to atypical pneumonia vs. uncontrolled asthma.  She is not volume overloaded on exam.   - Discussed how she should use Cardiomems regularly. She has contacted the company to help her troubleshoot connectivity issues.   - Increase Entresto to 97/103 mb bid. BMET today, repeat 10 days. - Continue current dose of torsemide 80 mg bid x 5 days, then decrease to 60 mg bid. - Continue spironolactone 25 mg daily.  - Did not tolerate Jardiance (arthralgias). 3. Hyperlipidemia: She remains on Zetia, Crestor and Praluent.  Lipids (4/22) ok. 4. Hypertension: Elevated today. Increase Entresto as above. 5. Cerebrovascular disease: She has history of CVA and had left carotid stent in 2/15. Followed by [facility].  Most recent MRA head showed severe stenosis of supraclinoid RICA and severe basilar artery stenosis.  - Neurology would like her to continue Plavix for this, despite concomitant apixaban use.  6. OHS/OSA: Cannot tolerate CPAP.  Uses oxygen with exertion and at night. No change. 7. Atrial fibrillation: Paroxysmal, no recent palpitations. 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  She is in NSR today.  - She has completed course of prednisone for  amiodarone lung toxicity.    - Continue apixaban 5 mg bid.  8. Obesity: Weight has trended down over the last year.  9. Mitral regurgitation: Moderate to severe MR on last echo in 11/20 but moderate on 12/20 TEE.  No significant enough for Mitraclip.  - Repeat echo 5/22 with normal EF. Calcified mass adjacent to mitral valve is more circumscribed and prominent. Repeat 11/22 to ensure no progression. 10. Bradycardia: Now off Toprol XL.   11. Dyspnea: Evaluated in ED 08/10/20. CXR consistent with CHF vs atypical PNA. CT negative for PE. Rx for doxycycline given.  - Now w/ wheezing on exam, dyspnea at rest. No fevers or chills. She is able to slow her breathing when asked to focus on taking slow deep breaths and relax. ? Role of anxiety in presentation coupled with increased albuterol use. Oxygen sat 97% today. - Repeat CXR, may need short course of steroids.  Followup in 6 weeks with APP to reassess dyspnea, 3 months with Dr. McLean  Signed, Crawford Tamura M Jessilynn Taft, FNP  08/12/2020  [facility] [address] [facility] [place] [address] [phone] (office) [phone] (fax) 

## 2020-08-12 ENCOUNTER — Other Ambulatory Visit: Payer: Self-pay

## 2020-08-12 ENCOUNTER — Ambulatory Visit (HOSPITAL_COMMUNITY)
Admission: RE | Admit: 2020-08-12 | Discharge: 2020-08-12 | Disposition: A | Discharge status: Home / Self Care | Payer: Medicare Other | Source: Ambulatory Visit | Attending: Family Medicine | Admitting: Family Medicine

## 2020-08-12 ENCOUNTER — Encounter (HOSPITAL_COMMUNITY): Payer: Self-pay

## 2020-08-12 VITALS — BP 160/97 | HR 76 | Wt 208.0 lb

## 2020-08-12 DIAGNOSIS — R42 Dizziness and giddiness: Secondary | ICD-10-CM | POA: Insufficient documentation

## 2020-08-12 DIAGNOSIS — Z79899 Other long term (current) drug therapy: Secondary | ICD-10-CM | POA: Diagnosis not present

## 2020-08-12 DIAGNOSIS — R0602 Shortness of breath: Secondary | ICD-10-CM

## 2020-08-12 DIAGNOSIS — Z955 Presence of coronary angioplasty implant and graft: Secondary | ICD-10-CM | POA: Diagnosis not present

## 2020-08-12 DIAGNOSIS — I130 Hypertensive heart and chronic kidney disease with heart failure and stage 1 through stage 4 chronic kidney disease, or unspecified chronic kidney disease: Secondary | ICD-10-CM | POA: Diagnosis not present

## 2020-08-12 DIAGNOSIS — E785 Hyperlipidemia, unspecified: Secondary | ICD-10-CM

## 2020-08-12 DIAGNOSIS — R0789 Other chest pain: Secondary | ICD-10-CM | POA: Diagnosis not present

## 2020-08-12 DIAGNOSIS — I10 Essential (primary) hypertension: Secondary | ICD-10-CM

## 2020-08-12 DIAGNOSIS — Z794 Long term (current) use of insulin: Secondary | ICD-10-CM | POA: Insufficient documentation

## 2020-08-12 DIAGNOSIS — I480 Paroxysmal atrial fibrillation: Secondary | ICD-10-CM

## 2020-08-12 DIAGNOSIS — Z7901 Long term (current) use of anticoagulants: Secondary | ICD-10-CM | POA: Diagnosis not present

## 2020-08-12 DIAGNOSIS — I080 Rheumatic disorders of both mitral and aortic valves: Secondary | ICD-10-CM | POA: Insufficient documentation

## 2020-08-12 DIAGNOSIS — Z8673 Personal history of transient ischemic attack (TIA), and cerebral infarction without residual deficits: Secondary | ICD-10-CM

## 2020-08-12 DIAGNOSIS — I5022 Chronic systolic (congestive) heart failure: Secondary | ICD-10-CM

## 2020-08-12 DIAGNOSIS — Z951 Presence of aortocoronary bypass graft: Secondary | ICD-10-CM | POA: Diagnosis not present

## 2020-08-12 DIAGNOSIS — R001 Bradycardia, unspecified: Secondary | ICD-10-CM

## 2020-08-12 DIAGNOSIS — Z6838 Body mass index (BMI) 38.0-38.9, adult: Secondary | ICD-10-CM | POA: Diagnosis not present

## 2020-08-12 DIAGNOSIS — I5042 Chronic combined systolic (congestive) and diastolic (congestive) heart failure: Secondary | ICD-10-CM | POA: Diagnosis not present

## 2020-08-12 DIAGNOSIS — R0600 Dyspnea, unspecified: Secondary | ICD-10-CM

## 2020-08-12 DIAGNOSIS — I2510 Atherosclerotic heart disease of native coronary artery without angina pectoris: Secondary | ICD-10-CM

## 2020-08-12 DIAGNOSIS — N189 Chronic kidney disease, unspecified: Secondary | ICD-10-CM | POA: Insufficient documentation

## 2020-08-12 DIAGNOSIS — M549 Dorsalgia, unspecified: Secondary | ICD-10-CM | POA: Diagnosis not present

## 2020-08-12 DIAGNOSIS — Z7902 Long term (current) use of antithrombotics/antiplatelets: Secondary | ICD-10-CM | POA: Insufficient documentation

## 2020-08-12 DIAGNOSIS — R062 Wheezing: Secondary | ICD-10-CM | POA: Insufficient documentation

## 2020-08-12 DIAGNOSIS — E1122 Type 2 diabetes mellitus with diabetic chronic kidney disease: Secondary | ICD-10-CM | POA: Diagnosis not present

## 2020-08-12 DIAGNOSIS — G4733 Obstructive sleep apnea (adult) (pediatric): Secondary | ICD-10-CM

## 2020-08-12 DIAGNOSIS — I340 Nonrheumatic mitral (valve) insufficiency: Secondary | ICD-10-CM

## 2020-08-12 DIAGNOSIS — Z888 Allergy status to other drugs, medicaments and biological substances status: Secondary | ICD-10-CM | POA: Diagnosis not present

## 2020-08-12 DIAGNOSIS — Z8249 Family history of ischemic heart disease and other diseases of the circulatory system: Secondary | ICD-10-CM | POA: Insufficient documentation

## 2020-08-12 LAB — BASIC METABOLIC PANEL
Anion gap: 10 (ref 5–15)
BUN: 14 mg/dL (ref 8–23)
CO2: 26 mmol/L (ref 22–32)
Calcium: 9.1 mg/dL (ref 8.9–10.3)
Chloride: 100 mmol/L (ref 98–111)
Creatinine, Ser: 1.02 mg/dL — ABNORMAL HIGH (ref 0.44–1.00)
GFR, Estimated: 58 mL/min — ABNORMAL LOW (ref >60)
Glucose, Bld: 201 mg/dL — ABNORMAL HIGH (ref 70–99)
Potassium: 3.7 mmol/L (ref 3.5–5.1)
Sodium: 136 mmol/L (ref 135–145)

## 2020-08-12 MED ORDER — ENTRESTO 97-103 MG PO TABS: 1.0000 | ORAL_TABLET | Freq: Two times a day (BID) | ORAL | 11 refills | Status: DC

## 2020-08-12 MED ORDER — TORSEMIDE 20 MG PO TABS: ORAL_TABLET | ORAL | 11 refills | Status: DC

## 2020-08-12 NOTE — Patient Instructions (Signed)
CONTINUE Torsemide 80 mg twice a day for 5 days, then decrease to 60 mg twice a day thereafter INCREASE Entresto to 97/103 mg, one tab twice a day  A chest x-ray takes a picture of the organs and structures inside the chest, including the heart, lungs, and blood vessels. This test can show several things, including, whether the heart is enlarges; whether fluid is building up in the lungs; and whether pacemaker / defibrillator leads are still in place.  Labs today We will only contact you if something comes back abnormal or we need to make some changes. Otherwise no news is good news!  Labs needed in 7-10  days  Your physician recommends that you schedule a follow-up appointment in: 6 weeks  in the [facility]  and in 3 months with Dr [name]  Do the following things EVERYDAY: Weigh yourself in the morning before breakfast. Write it down and keep it in a log. Take your medicines as prescribed Eat low salt foods--Limit salt (sodium) to 2000 mg per day.  Stay as active as you can everyday Limit all fluids for the day to less than 2 liters  At the [facility], you and your health needs are our priority. As part of our continuing mission to provide you with exceptional heart care, we have created designated Provider Care Teams. These Care Teams include your primary Cardiologist (physician) and Advanced Practice Providers (APPs- Physician Assistants and Nurse Practitioners) who all work together to provide you with the care you need, when you need it.   You may see any of the following providers on your designated Care Team at your next follow up: Dr Glori Bickers Dr Loralie Champagne Dr Patrice Paradise, NP Lyda Jester, [place] Ginnie Smart Audry Riles, PharmD   Please be sure to bring in all your medications bottles to every appointment.

## 2020-08-13 ENCOUNTER — Telehealth (HOSPITAL_COMMUNITY): Payer: Self-pay | Admitting: Family Medicine

## 2020-08-13 NOTE — Telephone Encounter (Signed)
Left message to call back. Her CXR has not been read yet but will follow up with her when I see results. Also calling to check on her breathing.  Allena Katz, FNP-BC

## 2020-08-22 ENCOUNTER — Ambulatory Visit (HOSPITAL_COMMUNITY)
Admission: RE | Admit: 2020-08-22 | Discharge: 2020-08-22 | Disposition: A | Discharge status: Home / Self Care | Payer: Medicare Other | Source: Ambulatory Visit | Attending: Internal Medicine | Admitting: Internal Medicine

## 2020-08-22 ENCOUNTER — Other Ambulatory Visit: Payer: Self-pay

## 2020-08-22 DIAGNOSIS — I5022 Chronic systolic (congestive) heart failure: Secondary | ICD-10-CM | POA: Insufficient documentation

## 2020-08-22 LAB — BASIC METABOLIC PANEL
Anion gap: 9 (ref 5–15)
BUN: 13 mg/dL (ref 8–23)
CO2: 28 mmol/L (ref 22–32)
Calcium: 8.7 mg/dL — ABNORMAL LOW (ref 8.9–10.3)
Chloride: 99 mmol/L (ref 98–111)
Creatinine, Ser: 1.05 mg/dL — ABNORMAL HIGH (ref 0.44–1.00)
GFR, Estimated: 56 mL/min — ABNORMAL LOW (ref >60)
Glucose, Bld: 121 mg/dL — ABNORMAL HIGH (ref 70–99)
Potassium: 3.9 mmol/L (ref 3.5–5.1)
Sodium: 136 mmol/L (ref 135–145)

## 2020-09-22 NOTE — Progress Notes (Signed)
ADVANCED HEART FAILURE CLINIC NOTE   Date:  09/23/2020   ID:  [name] [name], DOB 1945-03-21, [name] [id number]   Provider location: [facility] Type of Visit: Established patient   PCP:  Derinda Late, MD  Cardiologist:  Dr. [name]  Chief Complaint: Follow up for HF & exertional shortness of breath   History of Present Illness: [name] [name] is a 75 y.o. female who has a history of CAD s/p CABG, diastolic CHF, and cerebrovascular disease with history of CVA presents for followup of CHF and diastolic CHF. She had CABG x 5 in 12/11.  Prior to the CABG she had multiple PCIs.  She had a CVA in 2/14 that presented as visual loss.  She had a left carotid stent in 2/15.    Left heart cath done in Sept. 2014. This showed patent SVG-D, LIMA-LAD, and sequential SVG-OM branches but SVG-RCA and the native RCA were both totally occluded. It was felt aht her increased symptoms coincided with occlusion of SVG-RCA.  She was started on Imdur to see if this would help with the chest pain and dyspnea.  However, she feels like Imdur causes leg cramps and does not think that she can take it.  So ranolazine 500 mg bid started and titrated this up to 1000 mg bid.  This helped some but not markedly.  Therefore, sent to  Dr. Irish Lack to address opening RCA CTO. He was able to do this in 5/15 with 4 overlapping Xience DES.  This led to resolution of exertional chest pain.     Mrs Ende was started on Brilinta after PCI.  She became much more short of breath after starting on Brilinta. Per Dr Delano Metz stopped and replaced with Plavix.  Dyspnea improved off Brilinta. Given increasing exertional dyspnea and chest pain,  RHC/LHC was done in 4/17.  This showed stable CAD with no interventional target.  Left and right heart filling pressures were not significantly increased.  Medical management.    She had an MRI/MRA in May 2018 that showed moderately severe stenosis of the supraclinoid segment  of the right internal carotid artery, progressed since the 2016 MR angiogram and severe basilar artery stenosis.    She developed recurrent exertional chest pain and had LHC in 6/18, showing 4/5 grafts patent with patent native RCA (similar to prior cath, no changes).     Echo 1/19 with EF 55-60%, moderate diastolic dysfunction, PASP 50 mmHg, mildly dilated RV, mild AS, mild-moderate MR.    She reported increased dyspnea and chest pain and had RHC/LHC in 9/19.  She had DES to sequential SVG-OM1/PLOM.  While hospitalized, she was noted to have runs of atrial fibrillation.  She was very symptomatic with the atrial fibrillation.  Eliquis and amiodarone were started.    She was admitted 12/16-12/19/19 for cardiomems implant and RHC. She was diuresed with IV lasix and transitioned to torsemide 100 mg BID + metolazone once/week. DC weight: 295 lbs.   She had a prolonged hospitalization in 2/20 with acute respiratory failure and fever to 103.  She was thought to have PNA and treated with cefepime.  She was intubated.  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  Amiodarone was stopped and she was put on prednisone. Echo in 2/20 showed EF down to 25-30% with mid-apical LV severe hypokinesis. She had AKI. Possible ITP triggered by infection, HIT negative.  ITP was treated with Solumedrol. She was discharged to SNF.  ORIF left ankle 4/20.   11/20 admission initially with concern for TIAs (staring spells), ended up thinking possible partial seizures.  Head MRI did not show CVA.  Echo in 11/20 showed EF 45-50%, moderately decreased RV systolic function, moderate RV enlargement, moderate-severe MR, moderate TR.   Patient was admitted again in 12/20 with AKI and hypotension in the setting of sinus bradycardia (HR 30s).  She was volume overloaded on exam.  Toprol XL was stopped and HR increased to 60s.  She was diuresed with IV Lasix.  TEE in 12/20 showed EF 50-55%, septal-lateral  dyssynchrony, mildly decreased RV function, moderate MR.   Atypical chest pain in 1/21, Cardiolite showed EF 57%, fixed inferior defect (likely artifact), no ischemia.   She returned 4/22 for followup of CHF and CAD.  Still not using Cardiomems.  Weight is stable.  Uses rolling walker for balance. Short of breath with longer walks and stairs.  Occasional palpitations.  She will get random, nonexertional chest pain several times/week.  Pain will last 2-10 minutes.  This has been going on for years.  No lightheadedness. No falls.  No orthopnea/PND.   Today she returns for HF follow up. Still coughing some but better. SOB with increased physical activity.  Dizziness with head movements. She has fallen 3x in last 3 weeks, legs gave out from underneath her; she did not hit her head. Still with atypical CP, resolves spontaneously. However she did take nitro SL 1 day last week. Feels like heart is out of rhythm when this happens. Denies edema or PND/Orthopnea. Appetite ok. No fever or chills. Taking all medications.   Cardiomems (personally reviewed): no transmission since May 2022. She has contacted customer support and we will reactivate her account.  ECG (personally reviewed): SB LBBB, no significant changes from previous tracings.  Labs (6/14): K 3.8, creatinine 0.71 Labs (8/14): BNP 249, LDL 166 Labs (9/14): K 4.7, creatinine 0.7 Labs (9/14): K 4.6, creatinine 0.9, BNP 109 Labs (1/15): K 4.5, creatinine 0.73, LDL 212, HCT 43.4, TSH normal, BNP 138 Labs (6/15): K 4.7, creatinine 1.17, LDL 100, HDL 37, TGs 363, BNP 39 Labs (10/15): K 4.6, creatinine 1.2 Labs (1/16): LDL 157, HDL 43, K 5.2, creatinine 0.95 Labs (2/16): K 4.5, creatinine 1.08, BNP 113 Labs (4/16): LDL 50, HDL 56, TGs 179 Labs (9/16): K 5.3, creatinine 1.09 Labs (10/16): LDL 75, HDL 56 Labs (2/17): K 5.2, creatinine 1.07 Labs (4/17): K 5.1, creatinine 1.07 Labs (5/17): K 4.5, creatinine 1.01, LDL 96, HDL 56, BNP 70 Labs (03/2016)  K 4.9, creatinine 1.07 Labs (7/18): K 4.9, creatinine 1.15 Labs (11/18): K 5.1, creatinine 1.11, LDL 36 Labs (1/19): K 4.9, creatinine 1.22 Labs (9/19): LDL 108 Labs (10/19): hgb 14.5, K 4.3, creatinine 1.03 Labs 12/06/2017: K 4.7 Creatinine 1.7 Labs (3/20): K 4.8, creatinine 1.39, hgb 12.4 plts 226 Labs (11/20): K 4.2, creatinine 1.01, LDL 38 Labs (12/20): K 3.5, creatinine 1.8 Labs (3/21): K 4.5, creatinine 1.5 Labs (2/22): K 4.2, creatinine 1.13 Labs (4/22): K 4.2, creatinine 1.01, LDL 58 Labs (7/22): K 3.9, creatinine 1.05   PMH: 1. Diabetic gastroparesis 2. Type II diabetes 3. HTN 4. Morbid obesity 5. CAD: s/p CABG in 12/11 after prior PCIs.  LIMA-LAD, SVG-D, seq SVG-OM1 and OM2, SVG-PDA. Adenosine Cardiolite (8/14) with EF 53% and a small reversible apical defect with a medium, partially reversible inferior defect.  LHC (9/14) with patent SVG-D, LIMA-LAD (50% distal LAD), and sequential SVG-OM branches; the SVG-RCA and the native RCA were  occluded.  This was managed medically initially, but with ongoing exertional chest pain, it was decided to open CTO.  Patient had CTO opening with 4 overlapping Xience DES in the RCA in 5/15.   - LHC (4/17): SVG-D patent, LIMA-LAD patent, distal LAD with several 40-50% stenoses, sequential SVG-OM1 and PLOM patent with 50% proximal stenosis (not flow limiting), SVG-RCA TO with patent RCA stents.  - LHC (6/18): 4/5 grafts patent (SVG-RCA TO, same as past), RCA stents patent => no change.  - LHC (9/19): Sequential SVG-PLOM/OM1 with 80% proximal stenosis, s/p DES.  - Cardiolite (1/21): EF 57%, fixed inferior defect (likely artifact), no ischemia.  6. Atypical migraines 7. OHS/OSA: Intolerant of CPAP. Uses oxygen with exertion and at night.  8. GERD with hiatal hernia 9. OA 10. Chronic systolic CHF: Echo (76/16) with EF 50-55%, grade II diastolic dysfunction, mild-moderate MR.  Echo (8/14) with EF 55-60%, grade II diastolic dysfunction, mildly increased  aortic valve gradient (mean 12 mmHg) but valve opens well, mild MR and mild RV dilation.  Echo (9/15) with EF 60-65%, grade II diastolic dysfunction, mild aortic stenosis, mild mitral stenosis, mild to moderate mitral regurgitation, mildly dilated RV with normal systolic function, PA systolic pressure 46 mmHg.  - RHC (4/17): mean RA 9, PA 38/15 mean 27, mean PCWP 9, CI 2.5.  - Echo (5/17): EF 55-60%, mild LVH, mildly dilated RV with low normal systolic function, moderate TR, PASP 61 mmHg - Echo (1/19): EF 55-60%, moderate diastolic dysfunction, PASP 50 mmHg, mildly dilated RV, mild AS, mild-moderate MR. - RHC (9/19): mean RA 11, PA 34/12, mean PCWP 15, CI 2.85 - Echo (3/20): EF 25-30% with mid-apical severe hypokinesis.  - Echo (11/20): EF 45-50%, moderately dilated RV with moderately decreased systolic function, moderate-severe MR, moderate TR.  - TEE (12/20): EF 50-55%, septal-lateral dyssynchrony, moderate RV dilation/mildly decreased function, peak RV-RA gradient 51 mmHg, moderate MR.  11. CKD 12. Chronic LBBB 13. Anxiety 14. Carotid stenosis: Followed by [facility], >07% LICA stenosis 37/10.  She had left carotid stent in 2/15. Carotid dopplers 8/15 with no significant disease. Carotid dopplers (4/16): < 40% RICA stenosis.  15. Cerebrovascular disease: CVA 2/14 with right posterior cerebral artery territory ischemic infarction. Cerebral angiogram in 6/14 showed 70% right vertebral ostial stenosis, 62-69% LICA stenosis, > 48% proximal left posterior cerebral artery stenosis, posterior communicating artery aneurysm.  Patient has had episodes of transient expressive aphasia.  Carotid dopplers (54/62) showed >70% LICA stenosis. She had a left carotid stent 2/15.  Possible CVA in 7/15. -  MRI/MRA in May 2018 that showed moderately severe stenosis of the supraclinoid segment of the right internal carotid artery, progressed since the 2016 MR angiogram and severe basilar artery stenosis.  16. Positional vertigo  (suspected) 17. Palpitations: Holter (6/15) with rare PVCs/PACs.  - Event monitor (2/18): NSR, occasional PVCs. 18. Dyspnea with Brilinta. 19. Melanoma: On face, s/p excision.   20. Aortic stenosis: Mild.  21. Holter (8/16) with no significant arrhythmias.  22. Lower extremity arterial dopplers (9/16) were normal.  23. Sleep study (4/18): No significant OSA.  24. Atrial fibrillation: Paroxysmal - Amiodarone stopped, ?lung toxicity.  25. H/o ITP in 3/20.  16. Partial seizures. 17. Mitral regurgitation: Moderate on TEE in 12/20.    Current Outpatient Medications  Medication Sig Dispense Refill   acetaminophen (TYLENOL) 500 MG tablet Take 500 mg by mouth every 6 (six) hours as needed for mild pain, moderate pain, fever or headache.     albuterol (VENTOLIN HFA)  108 (90 Base) MCG/ACT inhaler Inhale 2 puffs into the lungs every 4 (four) hours as needed for wheezing or shortness of breath.      Alirocumab (PRALUENT) 150 MG/ML SOAJ Inject 150 mg/mL into the skin every 14 (fourteen) days.     ALPRAZolam (XANAX) 0.5 MG tablet Take 1-2 tablets (0.5-1 mg total) by mouth See admin instructions. Take one tablet (0.5 mg) by mouth every morning and two tablets (1 mg) at night, may also take 1 tablet (0.5 mg) midday as needed for anxiety     amLODipine (NORVASC) 10 MG tablet TAKE ONE (1) TABLET BY MOUTH EACH DAY 90 tablet 3   B Complex-C (B-COMPLEX WITH VITAMIN C) tablet Take 1 tablet by mouth daily.     bisacodyl (DULCOLAX) 5 MG EC tablet Take 5 mg by mouth daily as needed for moderate constipation.     buPROPion (WELLBUTRIN XL) 150 MG 24 hr tablet Take 150 mg by mouth every morning.     cholecalciferol (VITAMIN D3) 25 MCG (1000 UNIT) tablet Take 1,000 Units by mouth daily.     clopidogrel (PLAVIX) 75 MG tablet TAKE ONE TABLET BY MOUTH AT BEDTIME 90 tablet 3   denosumab (PROLIA) 60 MG/ML SOSY injection Inject 60 mg into the skin every 6 (six) months.      donepezil (ARICEPT) 5 MG tablet Take 1 tablet (5  mg total) by mouth at bedtime. 30 tablet 0   doxycycline (VIBRAMYCIN) 100 MG capsule Take 1 capsule (100 mg total) by mouth 2 (two) times daily. One po bid x 7 days 14 capsule 0   DULoxetine (CYMBALTA) 30 MG capsule Take 1 capsule (30 mg total) by mouth daily. 30 capsule 0   ELIQUIS 5 MG TABS tablet TAKE ONE TABLET BY MOUTH TWICE DAILY 180 tablet 3   ezetimibe (ZETIA) 10 MG tablet Take 1 tablet (10 mg total) by mouth at bedtime.     Fluticasone-Salmeterol (ADVAIR) 250-50 MCG/DOSE AEPB Inhale 1 puff into the lungs daily.     gabapentin (NEURONTIN) 600 MG tablet Take 600 mg by mouth 4 (four) times daily as needed.     Insulin Glargine (LANTUS SOLOSTAR) 100 UNIT/ML Solostar Pen Inject 38-44 Units into the skin See admin instructions. Inject 44 units subcutaneously daily at bedtime, increase to 44 units for CBG >200     insulin lispro (HUMALOG) 100 UNIT/ML KwikPen Inject 14-20 Units into the skin See admin instructions. Inject 14 units subcutaneously prior to breakfast and supper; add 4 units for CBG >200     ipratropium-albuterol (DUONEB) 0.5-2.5 (3) MG/3ML SOLN Take 3 mLs by nebulization 2 (two) times daily as needed (asthma).     methocarbamol (ROBAXIN) 500 MG tablet Take 500 mg by mouth 3 (three) times daily as needed.     Multiple Vitamin (MULTIVITAMIN WITH MINERALS) TABS tablet Take 1 tablet by mouth every morning. Centrum - Women over 55     Multiple Vitamins-Minerals (OCUVITE EYE HEALTH FORMULA) CAPS Take 1 capsule by mouth every morning.     nitroGLYCERIN (NITROSTAT) 0.3 MG SL tablet Place 1 tablet (0.3 mg total) under the tongue every 5 (five) minutes x 3 doses as needed for chest pain. 25 tablet 1   ondansetron (ZOFRAN-ODT) 4 MG disintegrating tablet Take 4 mg by mouth 2 (two) times daily as needed for nausea or vomiting.     OXYGEN Inhale 2 L into the lungs at bedtime as needed (shortness of breath).      pantoprazole (PROTONIX) 40 MG tablet Take  1 tablet (40 mg total) by mouth daily. 30  tablet 0   Polyvinyl Alcohol-Povidone (REFRESH OP) Place 1 drop into both eyes daily as needed (redness/ dry eyes).     potassium chloride SA (KLOR-CON) 20 MEQ tablet TAKE 2 TABLETS BY MOUTH EVERY MORNING AND 1 TABLET EVERY EVENING 90 tablet 3   Pyridoxine HCl (VITAMIN B-6 PO) Take 1 tablet by mouth every morning.     ranolazine (RANEXA) 1000 MG SR tablet TAKE ONE TABLET BY MOUTH TWICE DAILY 180 tablet 3   rosuvastatin (CRESTOR) 40 MG tablet Take 1 tablet (40 mg total) by mouth at bedtime. 30 tablet 0   sacubitril-valsartan (ENTRESTO) 97-103 MG Take 1 tablet by mouth 2 (two) times daily. 60 tablet 11   spironolactone (ALDACTONE) 25 MG tablet TAKE 1 TABLET BY MOUTH DAILY 30 tablet 6   torsemide (DEMADEX) 20 MG tablet Take 20 mg by mouth 2 (two) times daily.     traZODone (DESYREL) 50 MG tablet Take 1 tablet (50 mg total) by mouth at bedtime. 90 tablet 1   triamcinolone cream (KENALOG) 0.1 % Apply 1 application topically daily as needed (crusty spots on arms).     vitamin B-12 (CYANOCOBALAMIN) 1000 MCG tablet Take 1,000 mcg by mouth every morning.      zolpidem (AMBIEN) 10 MG tablet Take 10 mg by mouth at bedtime as needed for sleep.     No current facility-administered medications for this encounter.    Allergies:   Amoxicillin, Brilinta [ticagrelor], Erythromycin, Flagyl [metronidazole], Penicillins, Isosorbide mononitrate [isosorbide nitrate], Jardiance [empagliflozin], Metformin and related, Tape, and Erythromycin base   Social History:  The patient  reports that she has never smoked. She has never used smokeless tobacco. She reports that she does not drink alcohol and does not use drugs.   Family History:  The patient's family history includes AAA (abdominal aortic aneurysm) in her father and mother; Cancer in her sister; Deep vein thrombosis in her father and mother; Diabetes in her father, paternal aunt, paternal grandmother, paternal uncle, and paternal uncle; Heart attack in her father;  Heart attack (age of onset: 56) in her paternal grandfather; Heart disease in her father and paternal uncle; Hyperlipidemia in her father; Hypertension in her father, mother, and sister.   ROS:  Please see the history of present illness.   All other systems are personally reviewed and negative.   Exam:   BP 110/68   Pulse 75   Wt 94.7 kg (208 lb 12.8 oz)   SpO2 96%   BMI 38.19 kg/m   General:  NAD. No resp difficulty, walked into clinic with RW HEENT: Normal Neck: Supple. No JVD. Carotids 2+ bilat; no bruits. No lymphadenopathy or thryomegaly appreciated. Cor: PMI nondisplaced. Regular rate & rhythm. No rubs, gallops, II/VI HSM LLSB Lungs: Clear Abdomen: Obese, nontender, nondistended. No hepatosplenomegaly. No bruits or masses. Good bowel sounds. Extremities: No cyanosis, clubbing, rash, edema Neuro: Alert & oriented x 3, cranial nerves grossly intact. Moves all 4 extremities w/o difficulty. Affect pleasant.  Recent Labs: 08/10/2020: B Natriuretic Peptide 469.3; Hemoglobin 14.0; Platelets 209 08/22/2020: BUN 13; Creatinine, Ser 1.05; Potassium 3.9; Sodium 136  Personally reviewed   Wt Readings from Last 3 Encounters:  09/23/20 94.7 kg (208 lb 12.8 oz)  08/12/20 94.3 kg (208 lb)  08/10/20 97.5 kg (215 lb)    ASSESSMENT AND PLAN:  1. CAD: Occluded native RCA and SVG-RCA.  Status post opening of CTO RCA with 4 overlapping Xience DES in 5/15.  DES to sequential SVG-OM1/PLOM in 9/19.  She has been on Plavix and Eliquis.  She has atypical chest pain, sounds like there has been no real change to this pattern.  No ischemia on 1/21 Cardiolite.  - We discussed today repeating a stress test, but she says she will hold off unless pain gets worse or increases in frequency. Also offered to place another Zio ,as she feels her chest pain is associated with feeling like her heart is out of rhythm. She declined this. - Continue on Plavix (neuro has wanted her to stay on this for cerebrovascular  disease).   - She has not tolerated Imdur in the past. Off Toprol XL with bradycardia.  Continue ranolazine 1000 mg bid.  2. Chronic systolic => diastolic CHF: Echo in 0/09 with EF 55-60%, moderate diastolic dysfunction. Cardiomems placed 01/16/18.  Echo in 3/20 in setting of severe PNA with intubation showed EF 25-30%, mid-apical LV severe hypokinesis.  Possible stress (Takotsubo-type) cardiomyopathy related to severe medical illness versus progression of CAD.  She has baseline chronic LBBB which could play a role in cardiomyopathy as well (LBBB cardiomyopathy).  Repeat echo in 11/20 with EF up to 45-50%, moderately dysfunctional RV.  TEE in 12/20 with EF 50-55% with septal-lateral dyssynchrony and mildly dysfunctional RV.  Echo 5/22 with EF 60-65%, calcified mass adjacent to mitral valve. Stable NYHA class III-early IIIb symptoms. She is not volume overloaded on exam.   - She is now sending Cardiomems readings. Will re-active her profile. - Continue Entresto 97/103 mg bid. BMET today. - Continue torsemide 20 mg bid. - Continue spironolactone 25 mg daily.  - Did not tolerate Jardiance (arthralgias). 3. Hyperlipidemia: She remains on Zetia, Crestor and Praluent.  Lipids (4/22) ok. 4. Hypertension: Controlled. Continue current medications. 5. Cerebrovascular disease: She has history of CVA and had left carotid stent in 2/15. Followed by [facility].  Most recent MRA head showed severe stenosis of supraclinoid RICA and severe basilar artery stenosis. She has repeat carotid dopplers scheduled next month. - Neurology would like her to continue Plavix for this, despite concomitant apixaban use.  6. OHS/OSA: Cannot tolerate CPAP.  Uses oxygen with exertion and at night. No change. 7. Atrial fibrillation: Paroxysmal, no recent palpitations. 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  She is in NSR today.  - She has completed course of prednisone for amiodarone lung toxicity.    - Continue  apixaban 5 mg bid.  8. Obesity: Weight has trended down over the last year. Body mass index is 38.19 kg/m. 9. Mitral regurgitation: Moderate to severe MR on last echo in 11/20 but moderate on 12/20 TEE.  No significant enough for Mitraclip.  - Repeat echo 5/22 with normal EF. Calcified mass adjacent to mitral valve is more circumscribed and prominent. Repeat 11/22 to ensure no progression. 10. Bradycardia: Now off Toprol XL.  11. Physical Deconditioning: With her recent falls after her legs have given out, will see if we can arrange for cardiac rehab. She has done this in the past and has found it helpful.  Followup in 2 months with Dr. [name] as scheduled.  Signed, Rafael Bihari, FNP  09/23/2020  [facility] [address] [facility] [place] [address] [phone] (office) [phone] (fax)

## 2020-09-23 ENCOUNTER — Other Ambulatory Visit: Payer: Self-pay

## 2020-09-23 ENCOUNTER — Ambulatory Visit (HOSPITAL_COMMUNITY)
Admission: RE | Admit: 2020-09-23 | Discharge: 2020-09-23 | Disposition: A | Discharge status: Home / Self Care | Payer: Medicare Other | Source: Ambulatory Visit | Attending: Family Medicine | Admitting: Family Medicine

## 2020-09-23 ENCOUNTER — Encounter (HOSPITAL_COMMUNITY): Payer: Self-pay

## 2020-09-23 VITALS — BP 110/68 | HR 75 | Wt 208.8 lb

## 2020-09-23 DIAGNOSIS — Z880 Allergy status to penicillin: Secondary | ICD-10-CM | POA: Diagnosis not present

## 2020-09-23 DIAGNOSIS — Z6838 Body mass index (BMI) 38.0-38.9, adult: Secondary | ICD-10-CM | POA: Insufficient documentation

## 2020-09-23 DIAGNOSIS — I480 Paroxysmal atrial fibrillation: Secondary | ICD-10-CM | POA: Insufficient documentation

## 2020-09-23 DIAGNOSIS — I2510 Atherosclerotic heart disease of native coronary artery without angina pectoris: Secondary | ICD-10-CM | POA: Diagnosis not present

## 2020-09-23 DIAGNOSIS — I340 Nonrheumatic mitral (valve) insufficiency: Secondary | ICD-10-CM

## 2020-09-23 DIAGNOSIS — N189 Chronic kidney disease, unspecified: Secondary | ICD-10-CM | POA: Insufficient documentation

## 2020-09-23 DIAGNOSIS — I25119 Atherosclerotic heart disease of native coronary artery with unspecified angina pectoris: Secondary | ICD-10-CM

## 2020-09-23 DIAGNOSIS — E785 Hyperlipidemia, unspecified: Secondary | ICD-10-CM | POA: Diagnosis not present

## 2020-09-23 DIAGNOSIS — Z7902 Long term (current) use of antithrombotics/antiplatelets: Secondary | ICD-10-CM | POA: Insufficient documentation

## 2020-09-23 DIAGNOSIS — Z79899 Other long term (current) drug therapy: Secondary | ICD-10-CM | POA: Insufficient documentation

## 2020-09-23 DIAGNOSIS — Z955 Presence of coronary angioplasty implant and graft: Secondary | ICD-10-CM | POA: Diagnosis not present

## 2020-09-23 DIAGNOSIS — R001 Bradycardia, unspecified: Secondary | ICD-10-CM | POA: Insufficient documentation

## 2020-09-23 DIAGNOSIS — Z881 Allergy status to other antibiotic agents status: Secondary | ICD-10-CM | POA: Diagnosis not present

## 2020-09-23 DIAGNOSIS — I5042 Chronic combined systolic (congestive) and diastolic (congestive) heart failure: Secondary | ICD-10-CM | POA: Insufficient documentation

## 2020-09-23 DIAGNOSIS — I080 Rheumatic disorders of both mitral and aortic valves: Secondary | ICD-10-CM | POA: Insufficient documentation

## 2020-09-23 DIAGNOSIS — R42 Dizziness and giddiness: Secondary | ICD-10-CM | POA: Diagnosis not present

## 2020-09-23 DIAGNOSIS — I447 Left bundle-branch block, unspecified: Secondary | ICD-10-CM | POA: Diagnosis not present

## 2020-09-23 DIAGNOSIS — Z951 Presence of aortocoronary bypass graft: Secondary | ICD-10-CM | POA: Diagnosis not present

## 2020-09-23 DIAGNOSIS — Z8673 Personal history of transient ischemic attack (TIA), and cerebral infarction without residual deficits: Secondary | ICD-10-CM | POA: Diagnosis not present

## 2020-09-23 DIAGNOSIS — R0789 Other chest pain: Secondary | ICD-10-CM | POA: Insufficient documentation

## 2020-09-23 DIAGNOSIS — Z794 Long term (current) use of insulin: Secondary | ICD-10-CM | POA: Insufficient documentation

## 2020-09-23 DIAGNOSIS — Z888 Allergy status to other drugs, medicaments and biological substances status: Secondary | ICD-10-CM | POA: Insufficient documentation

## 2020-09-23 DIAGNOSIS — Z8249 Family history of ischemic heart disease and other diseases of the circulatory system: Secondary | ICD-10-CM | POA: Diagnosis not present

## 2020-09-23 DIAGNOSIS — I10 Essential (primary) hypertension: Secondary | ICD-10-CM | POA: Diagnosis not present

## 2020-09-23 DIAGNOSIS — Z7901 Long term (current) use of anticoagulants: Secondary | ICD-10-CM | POA: Insufficient documentation

## 2020-09-23 DIAGNOSIS — R059 Cough, unspecified: Secondary | ICD-10-CM | POA: Insufficient documentation

## 2020-09-23 DIAGNOSIS — G4733 Obstructive sleep apnea (adult) (pediatric): Secondary | ICD-10-CM | POA: Diagnosis not present

## 2020-09-23 DIAGNOSIS — I130 Hypertensive heart and chronic kidney disease with heart failure and stage 1 through stage 4 chronic kidney disease, or unspecified chronic kidney disease: Secondary | ICD-10-CM | POA: Diagnosis not present

## 2020-09-23 DIAGNOSIS — R5381 Other malaise: Secondary | ICD-10-CM

## 2020-09-23 LAB — BASIC METABOLIC PANEL
Anion gap: 9 (ref 5–15)
BUN: 27 mg/dL — ABNORMAL HIGH (ref 8–23)
CO2: 28 mmol/L (ref 22–32)
Calcium: 9.2 mg/dL (ref 8.9–10.3)
Chloride: 101 mmol/L (ref 98–111)
Creatinine, Ser: 1.59 mg/dL — ABNORMAL HIGH (ref 0.44–1.00)
GFR, Estimated: 34 mL/min — ABNORMAL LOW (ref >60)
Glucose, Bld: 149 mg/dL — ABNORMAL HIGH (ref 70–99)
Potassium: 4.2 mmol/L (ref 3.5–5.1)
Sodium: 138 mmol/L (ref 135–145)

## 2020-09-23 NOTE — Patient Instructions (Signed)
It was great to see you today! No medication changes are needed at this time.  Labs today We will only contact you if something comes back abnormal or we need to make some changes. Otherwise no news is good news!  You have been referred to [facility] -they will be in contact with an appointment  Keep follow up with Dr [name] as scheduled  Do the following things EVERYDAY: Weigh yourself in the morning before breakfast. Write it down and keep it in a log. Take your medicines as prescribed Eat low salt foods--Limit salt (sodium) to 2000 mg per day.  Stay as active as you can everyday Limit all fluids for the day to less than 2 liters  At the [facility], you and your health needs are our priority. As part of our continuing mission to provide you with exceptional heart care, we have created designated Provider Care Teams. These Care Teams include your primary Cardiologist (physician) and Advanced Practice Providers (APPs- Physician Assistants and Nurse Practitioners) who all work together to provide you with the care you need, when you need it.   You may see any of the following providers on your designated Care Team at your next follow up: Dr Glori Bickers Dr Loralie Champagne Dr Patrice Paradise, NP Lyda Jester, [place] Ginnie Smart Audry Riles, PharmD   Please be sure to bring in all your medications bottles to every appointment.

## 2020-09-24 ENCOUNTER — Other Ambulatory Visit (HOSPITAL_COMMUNITY): Payer: Self-pay | Admitting: Cardiology

## 2020-09-24 ENCOUNTER — Telehealth (HOSPITAL_COMMUNITY): Payer: Self-pay | Admitting: *Deleted

## 2020-09-24 DIAGNOSIS — Z9181 History of falling: Secondary | ICD-10-CM

## 2020-09-24 DIAGNOSIS — I5042 Chronic combined systolic (congestive) and diastolic (congestive) heart failure: Secondary | ICD-10-CM

## 2020-09-24 NOTE — Progress Notes (Signed)
Patient unable to complete cardiac rehab at this time. Per intake will need gait/balance/strengthen/vestibular training at this time.  Rehab at [facility] order placed

## 2020-09-24 NOTE — Telephone Encounter (Signed)
Received referral from Dr. [name] for this pt to participate in Cardiac rehab.  [name] [name] is well known to the cardiac rehab staff.  Reviewed office follow up note for 8/23. Noted that pt had fallen 3 x times in 3 weeks.  Called and [name] to let her know that she will need to complete BOOST program prior to cardiac rehab.  Pretty agreed and felt she was not ready for cardiac rehab which she is well aware of what is involved. Provider notified and agreed, will place referral for her to do BOOST first then Cardiac rehab.  Also gave her the number for [facility] for those times her husband could not bring her for appointments. Cherre Huger, BSN Cardiac and Training and development officer

## 2020-09-25 ENCOUNTER — Other Ambulatory Visit (HOSPITAL_COMMUNITY): Payer: Self-pay | Admitting: Cardiology

## 2020-09-25 DIAGNOSIS — I6529 Occlusion and stenosis of unspecified carotid artery: Secondary | ICD-10-CM

## 2020-09-26 ENCOUNTER — Telehealth (HOSPITAL_COMMUNITY): Payer: Self-pay | Admitting: Cardiology

## 2020-09-26 DIAGNOSIS — I5042 Chronic combined systolic (congestive) and diastolic (congestive) heart failure: Secondary | ICD-10-CM

## 2020-09-26 NOTE — Telephone Encounter (Signed)
-----   Message from Rafael Bihari, [place] sent at 09/23/2020  8:01 PM EDT ----- Kidney function elevated, please decrease torsemide to 20 mg daily. Will need repeat BMET in 10 days please.

## 2020-09-26 NOTE — Telephone Encounter (Signed)
Patient called.  Patient aware and voiced understanding  ?

## 2020-09-29 ENCOUNTER — Encounter: Payer: Self-pay | Admitting: Rehabilitative and Restorative Service Providers"

## 2020-09-29 ENCOUNTER — Other Ambulatory Visit: Payer: Self-pay

## 2020-09-29 ENCOUNTER — Ambulatory Visit: Payer: Medicare Other | Attending: Cardiology | Admitting: Rehabilitative and Restorative Service Providers"

## 2020-09-29 DIAGNOSIS — M6281 Muscle weakness (generalized): Secondary | ICD-10-CM | POA: Insufficient documentation

## 2020-09-29 DIAGNOSIS — R2681 Unsteadiness on feet: Secondary | ICD-10-CM | POA: Insufficient documentation

## 2020-09-29 DIAGNOSIS — R2689 Other abnormalities of gait and mobility: Secondary | ICD-10-CM | POA: Diagnosis present

## 2020-09-29 DIAGNOSIS — R262 Difficulty in walking, not elsewhere classified: Secondary | ICD-10-CM | POA: Diagnosis present

## 2020-09-29 NOTE — Patient Instructions (Signed)
Access Code: [email or web address] URL: [email or web address] Date: 09/29/2020 Prepared by: Juel Burrow  Exercises Seated Long Arc Quad - 2 x daily - 7 x weekly - 2 sets - 10 reps Seated March - 2 x daily - 7 x weekly - 2 sets - 10 reps

## 2020-09-29 NOTE — Therapy (Signed)
[facility] [place], [place], [address] Phone: [phone]   Fax:  [phone]  Physical Therapy Evaluation  Patient Details  Name: [name] [name] [name]: [id number] Date of Birth: 04-14-1945 Referring Provider (PT): Dr Loralie Champagne   Encounter Date: 09/29/2020   PT End of Session - 09/29/20 1135     Visit Number 1    Date for PT Re-Evaluation 12/19/20    PT Start Time 1045    PT Stop Time 1130    PT Time Calculation (min) 45 min    Activity Tolerance Patient tolerated treatment well    Behavior During Therapy [facility] for tasks assessed/performed             Past Medical History:  Diagnosis Date   Anemia    hx   Anxiety    Asthma    Basal cell carcinoma 05/2014   "left shoulder"   Bundle branch block, left    chronic/notes 07/18/2013   CHF (congestive heart failure) ([place])    Chronic insomnia 05/06/2015   Chronic kidney disease    frequency, sees dr Jamal Maes every 4 to 6 months (01/16/2018)   Chronic lower back pain    Claustrophobia    Common migraine 05/14/2014   Coronary artery disease    MI in 2001, 2002, 2006, 2011, 2014   Depression    Diabetic peripheral neuropathy ([place]) 01/12/2019   GERD (gastroesophageal reflux disease)    H/O hiatal hernia    Headache    "at least 2/month" (01/16/2018)   Heart murmur    Hyperlipidemia    Hypertension    Memory change 05/14/2014   Migraine    "5-6/year"  (01/16/2018)   Obesity 01-2010   Obstructive sleep apnea    "can't wear machine; I have claustrophobia" (01/16/2018), states she had a 2nd sleep study and does not have sleep apnea, her O2 decreases and now is on 2 L of O2 at night.   On home oxygen therapy    "2L at night and prn during daytime" (01/16/2018)   Osteoarthritis    "knees and hands" (01/16/2018)   Peripheral vascular disease ([place])    ? numbness, tingling arms and legs   PONV (postoperative nausea and vomiting)    Stroke ([employer])     2014, 2015, 2016   Type II diabetes mellitus ([place])    Ventral hernia    hx of    Past Surgical History:  Procedure Laterality Date   ANKLE FRACTURE SURGERY Left 1970's   APPENDECTOMY  1970's   w/hysterectomy   BASAL CELL CARCINOMA EXCISION Left 05/2014   "shoulder" (01/16/2018)   CARDIAC CATHETERIZATION  10/10/2012   Dr [name].   CARDIAC CATHETERIZATION N/A 05/29/2015   Procedure: Right/Left Heart Cath and Coronary/Graft Angiography;  Surgeon: Larey Dresser, MD;  Location: [facility];  Service: Cardiovascular;  Laterality: N/A;   CAROTID ENDARTERECTOMY Left 03/2013   CAROTID STENT INSERTION Left 03/20/2013   Procedure: CAROTID STENT INSERTION;  Surgeon: [name], MD;  Location: [facility] CATH LAB;  Service: Cardiovascular;  Laterality: Left;  internal carotid   CEREBRAL ANGIOGRAM N/A 04/05/2011   Procedure: CEREBRAL ANGIOGRAM;  Surgeon: Angelia Mould, MD;  Location: [facility] CATH LAB;  Service: Cardiovascular;  Laterality: N/A;   CHOLECYSTECTOMY OPEN  2004   CORONARY ANGIOPLASTY WITH STENT PLACEMENT  01,02,05,06,07,08,11; 04/24/2013   "I've probably got ~ 10 stents by now" (04/24/2013)   CORONARY ANGIOPLASTY WITH STENT PLACEMENT  06/13/2013   "got 4 stents today" (06/13/2013)   CORONARY ARTERY BYPASS GRAFT  1220/11   "CABG X5"   CORONARY STENT INTERVENTION N/A 10/20/2017   Procedure: CORONARY STENT INTERVENTION;  Surgeon: Troy Sine, MD;  Location: [place] [facility];  Service: Cardiovascular;  Laterality: N/A;   ESOPHAGOGASTRODUODENOSCOPY  08/03/2011   Procedure: ESOPHAGOGASTRODUODENOSCOPY (EGD);  Surgeon: Shann Medal, MD;  Location: [facility];  Service: General;  Laterality: N/A;   ESOPHAGOGASTRODUODENOSCOPY (EGD) WITH PROPOFOL N/A 03/11/2014   Procedure: ESOPHAGOGASTRODUODENOSCOPY (EGD) WITH PROPOFOL;  Surgeon: Lafayette Dragon, MD;  Location: [facility];  Service: Endoscopy;  Laterality: N/A;   FRACTURE SURGERY     gall stone removal  05/2003   GASTRIC RESTRICTION  SURGERY  1984   "stapeling"   HERNIA REPAIR  2004   "in my stomach; had OR on it twice", wire mesh on 1 hernia   LEFT HEART CATH AND CORS/GRAFTS ANGIOGRAPHY N/A 07/09/2016   Procedure: LEFT HEART CATH AND CORS/GRAFTS ANGIOGRAPHY;  Surgeon: Larey Dresser, MD;  Location: [facility];  Service: Cardiovascular;  Laterality: N/A;   ORIF ANKLE FRACTURE Left 05/16/2018   ORIF ANKLE FRACTURE Left 05/16/2018   Procedure: OPEN REDUCTION INTERNAL FIXATION (ORIF) Left ankle with possible syndesmosis fixation;  Surgeon: Nicholes Stairs, MD;  Location: [facility];  Service: Orthopedics;  Laterality: Left;  125min   OVARY SURGERY  1970's   "tumor removed"   PERCUTANEOUS CORONARY STENT INTERVENTION (PCI-S) N/A 06/13/2013   Procedure: PERCUTANEOUS CORONARY STENT INTERVENTION (PCI-S);  Surgeon: [name], MD;  Location: [facility] CATH LAB;  Service: Cardiovascular;  Laterality: N/A;   PERCUTANEOUS STENT INTERVENTION N/A 04/24/2013   Procedure: PERCUTANEOUS STENT INTERVENTION;  Surgeon: [name], MD;  Location: [facility] CATH LAB;  Service: Cardiovascular;  Laterality: N/A;   RIGHT/LEFT HEART CATH AND CORONARY/GRAFT ANGIOGRAPHY N/A 10/20/2017   Procedure: RIGHT/LEFT HEART CATH AND CORONARY/GRAFT ANGIOGRAPHY;  Surgeon: Larey Dresser, MD;  Location: [place] [facility];  Service: Cardiovascular;  Laterality: N/A;   ROOT CANAL  10/2000   TEE WITHOUT CARDIOVERSION N/A 01/18/2019   Procedure: TRANSESOPHAGEAL ECHOCARDIOGRAM (TEE);  Surgeon: Larey Dresser, MD;  Location: [facility] ENDOSCOPY;  Service: Cardiovascular;  Laterality: N/A;   TIBIA FRACTURE SURGERY Right 1970's   rods and pins   TOOTH EXTRACTION     "1 on the upper; wisdom tooth on the lower" (01/16/2018)   TOTAL ABDOMINAL HYSTERECTOMY  1970's   w/ appendectomy    There were no vitals filed for this visit.    Subjective Assessment - 09/29/20 1052     Subjective Pt reports that in 2020 she had a bought of Pneumonia with her underlying CHF  she ended up in ICU.  She was in the hospital for a prolonged time and underwent therapy following.  She had home health PT, then transitioned to outpatient PT. She went to cardiac rehab in the past, but does not feel that she is able to return to that yet until she gets stronger.    Pertinent History CHF, Pneumonia    Limitations Walking    Patient Stated Goals I want to be able to stand up without falling over.    Currently in Pain? Yes    Pain Score 8     Pain Location Back    Pain Orientation Lower    Pain Descriptors / Indicators Aching;Jabbing    Pain Type Chronic pain    Pain Onset More than a month ago  Pain Frequency Intermittent                [facility] PT Assessment - 09/29/20 0001       Assessment   Medical Diagnosis Falls, CHF    Referring Provider (PT) Dr Loralie Champagne    Hand Dominance Right    Next MD Visit 11/17/2020    Prior Therapy yes      Precautions   Precautions Fall      Restrictions   Weight Bearing Restrictions No      Balance Screen   Has the patient fallen in the past 6 months Yes    How many times? >20    Has the patient had a decrease in activity level because of a fear of falling?  Yes    Is the patient reluctant to leave their home because of a fear of falling?  Yes      [place] Private residence    Living Arrangements Spouse/significant other    Type of [place] to enter    Entrance Stairs-Number of Steps [place] Two level;Bed/bath upstairs    [place] - 4 wheels;Shower seat;Grab bars - toilet;Grab bars - tub/shower;Other (comment)   electric stair lift     Prior Function   Level of Independence Independent    Vocation Part time employment    Database administrator, and operational decisions    Leisure reading, walking outside      Cognition   Overall Cognitive Status Within Functional Limits for tasks assessed      ROM / Strength   AROM /  PROM / Strength Strength      Strength   Overall Strength Comments B shoulder strength of 4/5.  RLE strength of 4/5, LLE strength of 4-/5      Transfers   Five time sit to stand comments  32.2 sec   from arm chair, difficulty with completing full upright stand without maintaining contact with chair     Standardized Balance Assessment   Standardized Balance Assessment Timed Up and Go Test      Timed Up and Go Test   TUG Normal TUG    Normal TUG (seconds) 26.7                        Objective measurements completed on examination: See above findings.       [facility] Adult PT Treatment/Exercise - 09/29/20 0001       Exercises   Exercises Knee/Hip      Knee/Hip Exercises: Seated   Long Arc Quad Strengthening;Both;2 sets;10 reps    Long Arc Quad Weight 2 lbs.    Marching Strengthening;Both;2 sets;10 reps    Marching Weights 2 lbs.                    PT Education - 09/29/20 1135     Education Details Pt provided with HEP    Person(s) Educated Patient    Methods Explanation;Demonstration;Verbal cues    Comprehension Verbalized understanding;Returned demonstration              PT Short Term Goals - 09/29/20 1143       PT SHORT TERM GOAL #1   Title Patient to be independent with initial HEP.    Time 2    Period Weeks    Status New  PT Long Term Goals - 09/29/20 1143       PT LONG TERM GOAL #1   Title Patient to be independent with advanced HEP.    Time 12    Period Weeks    Status New      PT LONG TERM GOAL #2   Title Patient to demonstrate B LE strength >/=4+/5.    Time 12    Period Weeks    Status New      PT LONG TERM GOAL #3   Title Patient to score <14 sec on TUG testing in order to decrease risk of falls.    Time 12    Period Weeks    Status New      PT LONG TERM GOAL #4   Title Patient to score <13 sec on 5xSTS in order to decrease risk of falls.    Time 12    Status New      PT LONG TERM GOAL #5    Title Patient to report tolerance for 15 min of walking with LRAD without fatigue limiting.    Time 12    Period Weeks    Status New                    Plan - 09/29/20 1137     Clinical Impression Statement [name] [name] is a 75 yo female who presents with a referral from Dr Loralie Champagne secondary to falls and CHF.  She admits to having more than 20 falls in past 6 months, with 2 falls over the weekend.  She uses a 4WRW at all times and states that she often loses her balance backwards.  She is hoping to be able to get back into Cardiopulmonary rehab, but needs to get stronger first.  She presents with BUE/LE weakness, decreased balance, difficulty walking, and frequenct falls.  She would benefit from skilled PT to address her functional impariments to allow her to be safer and lower her risk of falling.    Personal Factors and Comorbidities Age;Comorbidity 2    Comorbidities CHF, Pneumonia    Examination-Activity Limitations Locomotion Level;Stairs    Examination-Participation Restrictions Occupation;Community Activity    Stability/Clinical Decision Making Evolving/Moderate complexity    Clinical Decision Making Moderate    Rehab Potential Good    PT Frequency 2x / week    PT Duration 12 weeks    PT Treatment/Interventions ADLs/Self Care Home Management;Cryotherapy;Electrical Stimulation;Iontophoresis 4mg /ml Dexamethasone;Moist Heat;Gait training;Stair training;Functional mobility training;Therapeutic activities;Therapeutic exercise;Balance training;Neuromuscular re-education;Patient/family education;Manual techniques;Passive range of motion;Dry needling;Taping;Vasopneumatic Device;Joint Manipulations;Spinal Manipulations    PT Next Visit Plan progress HEP, ambulation, strengthening, balance    PT Home Exercise Plan Access Code [email or web address]    Consulted and Agree with Plan of Care Patient             Patient will benefit from skilled therapeutic intervention in order to improve  the following deficits and impairments:  Decreased balance, Difficulty walking, Decreased activity tolerance, Decreased strength, Pain  Visit Diagnosis: Unsteadiness on feet - Plan: PT plan of care cert/re-cert  Muscle weakness (generalized) - Plan: PT plan of care cert/re-cert  Other abnormalities of gait and mobility - Plan: PT plan of care cert/re-cert  Difficulty in walking, not elsewhere classified - Plan: PT plan of care cert/re-cert     Problem List Patient Active Problem List   Diagnosis Date Noted   Symptomatic bradycardia 01/16/2019   Normocytic anemia 01/16/2019   Diabetic peripheral neuropathy ([place]) 01/12/2019   Neurologic abnormality  12/25/2018   Debility 09/21/2018   Hypotension 09/18/2018   Bradycardia 09/18/2018   AKI (acute kidney injury) ([place]) 09/18/2018   Pressure injury of skin 09/18/2018   Ischemic cerebrovascular accident (CVA) of frontal lobe ([place]) 09/13/2018   Fall 09/09/2018   History of CVA (cerebrovascular accident) 09/09/2018   Type II diabetes mellitus with renal manifestations ([place]) 09/09/2018   CKD (chronic kidney disease), stage III 09/09/2018   Sepsis ([place]) 09/09/2018   Depression with anxiety 09/09/2018   Slurred speech 09/09/2018   Ankle fracture, left 05/16/2018   Amiodarone pulmonary toxicity    Physical deconditioning    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    Acute on chronic congestive heart failure ([place])    Chronic combined systolic (congestive) and diastolic (congestive) heart failure ([place]) 85/03/7739   Chronic systolic CHF (congestive heart failure) ([place]) 01/16/2018   Atrial fibrillation ([place]) 10/21/2017   CAD (coronary artery disease) of bypass graft 10/20/2017   Coronary artery disease of bypass graft of native heart with stable angina pectoris (HCC)    Chronic low back pain 08/24/2016   Pain of left thumb 08/05/2016   Nocturnal hypoxia 06/02/2016   Hoarseness 04/05/2016   Laryngopharyngeal reflux (LPR) 04/05/2016    Pharyngoesophageal dysphagia 04/05/2016   Angina decubitus ([place]) 05/22/2015   Chronic insomnia 05/06/2015   Common migraine 05/14/2014   Abnormality of gait 05/14/2014   Memory change 05/14/2014   Aneurysm, cerebral, nonruptured 05/14/2014   Cramp of limb-Left neck 05/10/2014   Hematemesis with nausea    Vomiting blood    Dizziness and giddiness 09/21/2013   Atypical chest pain 07/18/2013   Unstable angina (HCC) 04/09/2013   Carotid artery stenosis, symptomatic 03/20/2013   Cerebrovascular disease 07/10/2012   Cerebral artery occlusion with cerebral infarction ([place]) 07/10/2012   Mitral regurgitation 04/15/2012   TIA (transient ischemic attack) 04/15/2012   Occlusion and stenosis of carotid artery without mention of cerebral infarction 08/18/2011   Bariatric surgery status 06/17/2011   Speech abnormality 03/22/2011   Dyspnea 02/24/2011   PAPILLARY MUSCLE DYSFUNCTION, NON-RHEUMATIC 10/09/2008   UNSPECIFIED VITAMIN D DEFICIENCY 10/24/2007   MYOCARDIAL INFARCTION, HX OF 10/24/2007   PERSISTENT VOMITING 10/24/2007   OSTEOARTHRITIS 10/24/2007   MIGRAINES, HX OF 10/24/2007   Diabetes mellitus type 2, controlled, with complications ([place]) 28/78/6767   Hyperlipemia 01/16/2007   Obesity-post failed open gastroplasty 1984  01/16/2007   OBSTRUCTIVE SLEEP APNEA 01/16/2007   Essential hypertension 01/16/2007   Coronary atherosclerosis 01/16/2007   Asthma 01/16/2007   GERD 01/16/2007   VENTRAL HERNIA 01/16/2007    Juel Burrow, PT, DPT 09/29/2020, 12:37 PM  [facility] [place], [place], [address] Phone: [phone]   Fax:  [phone]  Name: [name] [name] [name]: [id number] Date of Birth: 05-Jul-1945

## 2020-09-30 ENCOUNTER — Encounter (HOSPITAL_COMMUNITY): Payer: Medicare Other

## 2020-09-30 MED ORDER — TORSEMIDE 20 MG PO TABS: 20.0000 mg | ORAL_TABLET | Freq: Every day | ORAL | 6 refills | Status: DC

## 2020-10-07 ENCOUNTER — Other Ambulatory Visit (HOSPITAL_COMMUNITY): Payer: Self-pay | Admitting: Cardiology

## 2020-10-07 ENCOUNTER — Other Ambulatory Visit: Payer: Self-pay

## 2020-10-07 ENCOUNTER — Ambulatory Visit (HOSPITAL_COMMUNITY)
Admission: RE | Admit: 2020-10-07 | Discharge: 2020-10-07 | Disposition: A | Discharge status: Home / Self Care | Payer: Medicare Other | Source: Ambulatory Visit | Attending: Cardiovascular Disease | Admitting: Cardiovascular Disease

## 2020-10-07 ENCOUNTER — Ambulatory Visit: Payer: Medicare Other | Attending: Cardiology | Admitting: Physical Therapy

## 2020-10-07 DIAGNOSIS — I6523 Occlusion and stenosis of bilateral carotid arteries: Secondary | ICD-10-CM

## 2020-10-07 DIAGNOSIS — I6529 Occlusion and stenosis of unspecified carotid artery: Secondary | ICD-10-CM | POA: Diagnosis not present

## 2020-10-07 DIAGNOSIS — I5042 Chronic combined systolic (congestive) and diastolic (congestive) heart failure: Secondary | ICD-10-CM

## 2020-10-07 DIAGNOSIS — M6281 Muscle weakness (generalized): Secondary | ICD-10-CM | POA: Insufficient documentation

## 2020-10-07 DIAGNOSIS — Z9181 History of falling: Secondary | ICD-10-CM | POA: Insufficient documentation

## 2020-10-07 DIAGNOSIS — R2689 Other abnormalities of gait and mobility: Secondary | ICD-10-CM | POA: Insufficient documentation

## 2020-10-07 DIAGNOSIS — R5381 Other malaise: Secondary | ICD-10-CM | POA: Insufficient documentation

## 2020-10-07 DIAGNOSIS — R2681 Unsteadiness on feet: Secondary | ICD-10-CM | POA: Diagnosis not present

## 2020-10-07 DIAGNOSIS — R262 Difficulty in walking, not elsewhere classified: Secondary | ICD-10-CM | POA: Insufficient documentation

## 2020-10-07 LAB — BASIC METABOLIC PANEL
Anion gap: 6 (ref 5–15)
BUN: 24 mg/dL — ABNORMAL HIGH (ref 8–23)
CO2: 26 mmol/L (ref 22–32)
Calcium: 9.3 mg/dL (ref 8.9–10.3)
Chloride: 106 mmol/L (ref 98–111)
Creatinine, Ser: 1.27 mg/dL — ABNORMAL HIGH (ref 0.44–1.00)
GFR, Estimated: 45 mL/min — ABNORMAL LOW (ref >60)
Glucose, Bld: 191 mg/dL — ABNORMAL HIGH (ref 70–99)
Potassium: 4.3 mmol/L (ref 3.5–5.1)
Sodium: 138 mmol/L (ref 135–145)

## 2020-10-07 NOTE — Therapy (Signed)
[facility] [place], [place], [address] Phone: [phone]   Fax:  [phone]  Physical Therapy Treatment  Patient Details  Name: [name] [name] [name]: [id number] Date of Birth: Mar 10, 1945 Referring Provider (PT): Dr Loralie Champagne   Encounter Date: 10/07/2020   PT End of Session - 10/07/20 0913     Visit Number 2    Date for PT Re-Evaluation 12/19/20    PT Start Time 0840    PT Stop Time 0922    PT Time Calculation (min) 42 min             Past Medical History:  Diagnosis Date   Anemia    hx   Anxiety    Asthma    Basal cell carcinoma 05/2014   "left shoulder"   Bundle branch block, left    chronic/notes 07/18/2013   CHF (congestive heart failure) ([place])    Chronic insomnia 05/06/2015   Chronic kidney disease    frequency, sees dr Jamal Maes every 4 to 6 months (01/16/2018)   Chronic lower back pain    Claustrophobia    Common migraine 05/14/2014   Coronary artery disease    MI in 2001, 2002, 2006, 2011, 2014   Depression    Diabetic peripheral neuropathy ([place]) 01/12/2019   GERD (gastroesophageal reflux disease)    H/O hiatal hernia    Headache    "at least 2/month" (01/16/2018)   Heart murmur    Hyperlipidemia    Hypertension    Memory change 05/14/2014   Migraine    "5-6/year"  (01/16/2018)   Obesity 01-2010   Obstructive sleep apnea    "can't wear machine; I have claustrophobia" (01/16/2018), states she had a 2nd sleep study and does not have sleep apnea, her O2 decreases and now is on 2 L of O2 at night.   On home oxygen therapy    "2L at night and prn during daytime" (01/16/2018)   Osteoarthritis    "knees and hands" (01/16/2018)   Peripheral vascular disease ([place])    ? numbness, tingling arms and legs   PONV (postoperative nausea and vomiting)    Stroke ([place])    2014, 2015, 2016   Type II diabetes mellitus ([place])    Ventral hernia    hx of    Past Surgical History:   Procedure Laterality Date   ANKLE FRACTURE SURGERY Left 1970's   APPENDECTOMY  1970's   w/hysterectomy   BASAL CELL CARCINOMA EXCISION Left 05/2014   "shoulder" (01/16/2018)   CARDIAC CATHETERIZATION  10/10/2012   Dr [name].   CARDIAC CATHETERIZATION N/A 05/29/2015   Procedure: Right/Left Heart Cath and Coronary/Graft Angiography;  Surgeon: Larey Dresser, MD;  Location: [facility];  Service: Cardiovascular;  Laterality: N/A;   CAROTID ENDARTERECTOMY Left 03/2013   CAROTID STENT INSERTION Left 03/20/2013   Procedure: CAROTID STENT INSERTION;  Surgeon: [name], MD;  Location: [facility] CATH LAB;  Service: Cardiovascular;  Laterality: Left;  internal carotid   CEREBRAL ANGIOGRAM N/A 04/05/2011   Procedure: CEREBRAL ANGIOGRAM;  Surgeon: Angelia Mould, MD;  Location: [facility] CATH LAB;  Service: Cardiovascular;  Laterality: N/A;   CHOLECYSTECTOMY OPEN  2004   CORONARY ANGIOPLASTY WITH STENT PLACEMENT  01,02,05,06,07,08,11; 04/24/2013   "I've probably got ~ 10 stents by now" (04/24/2013)   CORONARY ANGIOPLASTY WITH STENT PLACEMENT  06/13/2013   "got 4 stents today" (06/13/2013)   CORONARY ARTERY BYPASS GRAFT  1220/11   "CABG  X5"   CORONARY STENT INTERVENTION N/A 10/20/2017   Procedure: CORONARY STENT INTERVENTION;  Surgeon: Troy Sine, MD;  Location: [facility];  Service: Cardiovascular;  Laterality: N/A;   ESOPHAGOGASTRODUODENOSCOPY  08/03/2011   Procedure: ESOPHAGOGASTRODUODENOSCOPY (EGD);  Surgeon: Shann Medal, MD;  Location: [facility];  Service: General;  Laterality: N/A;   ESOPHAGOGASTRODUODENOSCOPY (EGD) WITH PROPOFOL N/A 03/11/2014   Procedure: ESOPHAGOGASTRODUODENOSCOPY (EGD) WITH PROPOFOL;  Surgeon: Lafayette Dragon, MD;  Location: [facility];  Service: Endoscopy;  Laterality: N/A;   FRACTURE SURGERY     gall stone removal  05/2003   GASTRIC RESTRICTION SURGERY  1984   "stapeling"   HERNIA REPAIR  2004   "in my stomach; had OR on it twice", wire mesh on 1 hernia    LEFT HEART CATH AND CORS/GRAFTS ANGIOGRAPHY N/A 07/09/2016   Procedure: LEFT HEART CATH AND CORS/GRAFTS ANGIOGRAPHY;  Surgeon: Larey Dresser, MD;  Location: [facility];  Service: Cardiovascular;  Laterality: N/A;   ORIF ANKLE FRACTURE Left 05/16/2018   ORIF ANKLE FRACTURE Left 05/16/2018   Procedure: OPEN REDUCTION INTERNAL FIXATION (ORIF) Left ankle with possible syndesmosis fixation;  Surgeon: Nicholes Stairs, MD;  Location: [facility];  Service: Orthopedics;  Laterality: Left;  157mn   OVARY SURGERY  1970's   "tumor removed"   PERCUTANEOUS CORONARY STENT INTERVENTION (PCI-S) N/A 06/13/2013   Procedure: PERCUTANEOUS CORONARY STENT INTERVENTION (PCI-S);  Surgeon: JJettie Booze MD;  [facility];  Service: Cardiovascular;  Laterality: N/A;   PERCUTANEOUS STENT INTERVENTION N/A 04/24/2013   Procedure: PERCUTANEOUS STENT INTERVENTION;  Surgeon: JJettie Booze MD;  [facility];  Service: Cardiovascular;  Laterality: N/A;   RIGHT/LEFT HEART CATH AND CORONARY/GRAFT ANGIOGRAPHY N/A 10/20/2017   Procedure: RIGHT/LEFT HEART CATH AND CORONARY/GRAFT ANGIOGRAPHY;  Surgeon: MLarey Dresser MD;  [facility];  Service: Cardiovascular;  Laterality: N/A;   ROOT CANAL  10/2000   TEE WITHOUT CARDIOVERSION N/A 01/18/2019   Procedure: TRANSESOPHAGEAL ECHOCARDIOGRAM (TEE);  Surgeon: MLarey Dresser MD;  [facility];  Service: Cardiovascular;  Laterality: N/A;   TIBIA FRACTURE SURGERY Right 1970's   rods and pins   TOOTH EXTRACTION     "1 on the upper; wisdom tooth on the lower" (01/16/2018)   TOTAL ABDOMINAL HYSTERECTOMY  1970's   w/ appendectomy    There were no vitals filed for this visit.   Subjective Assessment - 10/07/20 0843     Subjective did HEP and did well. normal back pain. 5 falls in 3.5 weeks    Currently in Pain? Yes    Pain Score 5     Pain Location Back    Pain Orientation Lower                                [facility] Adult PT Treatment/Exercise - 10/07/20 0001       Knee/Hip Exercises: Aerobic   Nustep L 3 5 min   no rest needed but slow pace   Other Aerobic UBE L 1 2 min fwd/2 min backward   rest btwn fwd/back     Knee/Hip Exercises: Standing   Hip Flexion Stengthening;Both;10 reps   3# with RW   Hip Abduction Stengthening;Both;15 reps;Knee bent;Knee straight   3# with RW   Hip Extension Stengthening;Both;10 reps;Knee straight   3# with RW   Other Standing Knee Exercises HS curl 3# with RW 10x  each    Other Standing Knee Exercises red tband shld ext and row 2 sets 10      Knee/Hip Exercises: Seated   Long Arc Quad Strengthening;Both;1 set;10 reps    Long Arc Quad Weight 3 lbs.    Other Seated Knee/Hip Exercises wt ball chest press and OH press 10 x each    Other Seated Knee/Hip Exercises trunk flex and ext green tband 2 sets 10   isometric abs with ball 2 set s10   Marching Strengthening;Both;10 reps;Weights    Marching Weights 3 lbs.    Abduction/Adduction  Strengthening;Both;10 reps;Weights    Abd/Adduction Weights 3 lbs.    Sit to Sand 3 sets;5 reps;without UE support                      PT Short Term Goals - 10/07/20 0909       PT SHORT TERM GOAL #1   Title Patient to be independent with initial HEP.    Status Achieved               PT Long Term Goals - 09/29/20 1143       PT LONG TERM GOAL #1   Title Patient to be independent with advanced HEP.    Time 12    Period Weeks    Status New      PT LONG TERM GOAL #2   Title Patient to demonstrate B LE strength >/=4+/5.    Time 12    Period Weeks    Status New      PT LONG TERM GOAL #3   Title Patient to score <14 sec on TUG testing in order to decrease risk of falls.    Time 12    Period Weeks    Status New      PT LONG TERM GOAL #4   Title Patient to score <13 sec on 5xSTS in order to decrease risk of falls.    Time 12    Status New      PT LONG TERM  GOAL #5   Title Patient to report tolerance for 15 min of walking with LRAD without fatigue limiting.    Time 12    Period Weeks    Status New                   Plan - 10/07/20 0909     Clinical Impression Statement pt verb doing HEP and demo in clinic with wts-STG met. Focus session on ther ex for UE,LE and core as well instaidng balance. SBA/CGA needed with postural and core actviation cuing needed. pt tolerated initial ther ex progression well. Pt did require rest breaks for fatigue and mild SOB.    PT Next Visit Plan progress HEP, ambulation, strengthening, balance             Patient will benefit from skilled therapeutic intervention in order to improve the following deficits and impairments:  Decreased balance, Difficulty walking, Decreased activity tolerance, Decreased strength, Pain  Visit Diagnosis: Muscle weakness (generalized)  Unsteadiness on feet  Difficulty in walking, not elsewhere classified     Problem List Patient Active Problem List   Diagnosis Date Noted   Symptomatic bradycardia 01/16/2019   Normocytic anemia 01/16/2019   Diabetic peripheral neuropathy ([place]) 01/12/2019   Neurologic abnormality 12/25/2018   Debility 09/21/2018   Hypotension 09/18/2018   Bradycardia 09/18/2018   AKI (acute kidney injury) ([place]) 09/18/2018   Pressure injury of skin  09/18/2018   Ischemic cerebrovascular accident (CVA) of frontal lobe ([place]) 09/13/2018   Fall 09/09/2018   History of CVA (cerebrovascular accident) 09/09/2018   Type II diabetes mellitus with renal manifestations ([place]) 09/09/2018   CKD (chronic kidney disease), stage III 09/09/2018   Sepsis ([place]) 09/09/2018   Depression with anxiety 09/09/2018   Slurred speech 09/09/2018   Ankle fracture, left 05/16/2018   Amiodarone pulmonary toxicity    Physical deconditioning    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    Acute on chronic congestive heart failure ([place])    Chronic combined systolic (congestive) and  diastolic (congestive) heart failure ([place]) 00/71/2197   Chronic systolic CHF (congestive heart failure) ([place]) 01/16/2018   Atrial fibrillation ([place]) 10/21/2017   CAD (coronary artery disease) of bypass graft 10/20/2017   Coronary artery disease of bypass graft of native heart with stable angina pectoris (HCC)    Chronic low back pain 08/24/2016   Pain of left thumb 08/05/2016   Nocturnal hypoxia 06/02/2016   Hoarseness 04/05/2016   Laryngopharyngeal reflux (LPR) 04/05/2016   Pharyngoesophageal dysphagia 04/05/2016   Angina decubitus ([place]) 05/22/2015   Chronic insomnia 05/06/2015   Common migraine 05/14/2014   Abnormality of gait 05/14/2014   Memory change 05/14/2014   Aneurysm, cerebral, nonruptured 05/14/2014   Cramp of limb-Left neck 05/10/2014   Hematemesis with nausea    Vomiting blood    Dizziness and giddiness 09/21/2013   Atypical chest pain 07/18/2013   Unstable angina (HCC) 04/09/2013   Carotid artery stenosis, symptomatic 03/20/2013   Cerebrovascular disease 07/10/2012   Cerebral artery occlusion with cerebral infarction ([place]) 07/10/2012   Mitral regurgitation 04/15/2012   TIA (transient ischemic attack) 04/15/2012   Occlusion and stenosis of carotid artery without mention of cerebral infarction 08/18/2011   Bariatric surgery status 06/17/2011   Speech abnormality 03/22/2011   Dyspnea 02/24/2011   PAPILLARY MUSCLE DYSFUNCTION, NON-RHEUMATIC 10/09/2008   UNSPECIFIED VITAMIN D DEFICIENCY 10/24/2007   MYOCARDIAL INFARCTION, HX OF 10/24/2007   PERSISTENT VOMITING 10/24/2007   OSTEOARTHRITIS 10/24/2007   MIGRAINES, HX OF 10/24/2007   Diabetes mellitus type 2, controlled, with complications ([place]) 58/83/2549   Hyperlipemia 01/16/2007   Obesity-post failed open gastroplasty 1984  01/16/2007   OBSTRUCTIVE SLEEP APNEA 01/16/2007   Essential hypertension 01/16/2007   Coronary atherosclerosis 01/16/2007   Asthma 01/16/2007   GERD 01/16/2007   VENTRAL HERNIA 01/16/2007     Rufina Kimery,ANGIE PTA 10/07/2020, 9:14 AM  [facility]. [place], [place], [address] Phone: [phone]   Fax:  [phone]  Name: PHYLICIA MCGAUGH [name]: [id number] Date of Birth: October 28, 1945

## 2020-10-08 ENCOUNTER — Encounter (HOSPITAL_COMMUNITY): Payer: Medicare Other

## 2020-10-09 ENCOUNTER — Other Ambulatory Visit: Payer: Self-pay

## 2020-10-09 ENCOUNTER — Ambulatory Visit: Payer: Medicare Other | Admitting: Physical Therapy

## 2020-10-09 DIAGNOSIS — R2681 Unsteadiness on feet: Secondary | ICD-10-CM | POA: Diagnosis present

## 2020-10-09 DIAGNOSIS — R262 Difficulty in walking, not elsewhere classified: Secondary | ICD-10-CM | POA: Diagnosis present

## 2020-10-09 DIAGNOSIS — M6281 Muscle weakness (generalized): Secondary | ICD-10-CM | POA: Diagnosis present

## 2020-10-09 DIAGNOSIS — R5381 Other malaise: Secondary | ICD-10-CM

## 2020-10-09 DIAGNOSIS — R2689 Other abnormalities of gait and mobility: Secondary | ICD-10-CM | POA: Diagnosis present

## 2020-10-09 NOTE — Therapy (Signed)
[facility]. [place], [place], [address] Phone: [phone]   Fax:  [phone]  Physical Therapy Treatment  Patient Details  Name: [name] [name] [name]: [id number] Date of Birth: July 11, 1945 Referring Provider (PT): Dr Loralie Champagne   Encounter Date: 10/09/2020   PT End of Session - 10/09/20 1207     Visit Number 3    Date for PT Re-Evaluation 12/19/20    PT Start Time 1130    PT Stop Time 1217    PT Time Calculation (min) 47 min             Past Medical History:  Diagnosis Date   Anemia    hx   Anxiety    Asthma    Basal cell carcinoma 05/2014   "left shoulder"   Bundle branch block, left    chronic/notes 07/18/2013   CHF (congestive heart failure) ([place])    Chronic insomnia 05/06/2015   Chronic kidney disease    frequency, sees dr Jamal Maes every 4 to 6 months (01/16/2018)   Chronic lower back pain    Claustrophobia    Common migraine 05/14/2014   Coronary artery disease    MI in 2001, 2002, 2006, 2011, 2014   Depression    Diabetic peripheral neuropathy ([place]) 01/12/2019   GERD (gastroesophageal reflux disease)    H/O hiatal hernia    Headache    "at least 2/month" (01/16/2018)   Heart murmur    Hyperlipidemia    Hypertension    Memory change 05/14/2014   Migraine    "5-6/year"  (01/16/2018)   Obesity 01-2010   Obstructive sleep apnea    "can't wear machine; I have claustrophobia" (01/16/2018), states she had a 2nd sleep study and does not have sleep apnea, her O2 decreases and now is on 2 L of O2 at night.   On home oxygen therapy    "2L at night and prn during daytime" (01/16/2018)   Osteoarthritis    "knees and hands" (01/16/2018)   Peripheral vascular disease ([place])    ? numbness, tingling arms and legs   PONV (postoperative nausea and vomiting)    Stroke ([place])    2014, 2015, 2016   Type II diabetes mellitus ([place])    Ventral hernia    hx of    Past Surgical History:   Procedure Laterality Date   ANKLE FRACTURE SURGERY Left 1970's   APPENDECTOMY  1970's   w/hysterectomy   BASAL CELL CARCINOMA EXCISION Left 05/2014   "shoulder" (01/16/2018)   CARDIAC CATHETERIZATION  10/10/2012   Dr [name].   CARDIAC CATHETERIZATION N/A 05/29/2015   Procedure: Right/Left Heart Cath and Coronary/Graft Angiography;  Surgeon: Larey Dresser, MD;  Location: [facility];  Service: Cardiovascular;  Laterality: N/A;   CAROTID ENDARTERECTOMY Left 03/2013   CAROTID STENT INSERTION Left 03/20/2013   Procedure: CAROTID STENT INSERTION;  Surgeon: [name], MD;  Location: [facility] CATH LAB;  Service: Cardiovascular;  Laterality: Left;  internal carotid   CEREBRAL ANGIOGRAM N/A 04/05/2011   Procedure: CEREBRAL ANGIOGRAM;  Surgeon: Angelia Mould, MD;  Location: [facility] CATH LAB;  Service: Cardiovascular;  Laterality: N/A;   CHOLECYSTECTOMY OPEN  2004   CORONARY ANGIOPLASTY WITH STENT PLACEMENT  01,02,05,06,07,08,11; 04/24/2013   "I've probably got ~ 10 stents by now" (04/24/2013)   CORONARY ANGIOPLASTY WITH STENT PLACEMENT  06/13/2013   "got 4 stents today" (06/13/2013)   CORONARY ARTERY BYPASS GRAFT  1220/11   "CABG  X5"   CORONARY STENT INTERVENTION N/A 10/20/2017   Procedure: CORONARY STENT INTERVENTION;  Surgeon: Troy Sine, MD;  Location: [facility];  Service: Cardiovascular;  Laterality: N/A;   ESOPHAGOGASTRODUODENOSCOPY  08/03/2011   Procedure: ESOPHAGOGASTRODUODENOSCOPY (EGD);  Surgeon: Shann Medal, MD;  Location: [facility];  Service: General;  Laterality: N/A;   ESOPHAGOGASTRODUODENOSCOPY (EGD) WITH PROPOFOL N/A 03/11/2014   Procedure: ESOPHAGOGASTRODUODENOSCOPY (EGD) WITH PROPOFOL;  Surgeon: Lafayette Dragon, MD;  [facility];  Service: Endoscopy;  Laterality: N/A;   FRACTURE SURGERY     gall stone removal  05/2003   GASTRIC RESTRICTION SURGERY  1984   "stapeling"   HERNIA REPAIR  2004   "in my stomach; had OR on it twice", wire mesh on 1 hernia    LEFT HEART CATH AND CORS/GRAFTS ANGIOGRAPHY N/A 07/09/2016   Procedure: LEFT HEART CATH AND CORS/GRAFTS ANGIOGRAPHY;  Surgeon: Larey Dresser, MD;  Location: [facility];  Service: Cardiovascular;  Laterality: N/A;   ORIF ANKLE FRACTURE Left 05/16/2018   ORIF ANKLE FRACTURE Left 05/16/2018   Procedure: OPEN REDUCTION INTERNAL FIXATION (ORIF) Left ankle with possible syndesmosis fixation;  Surgeon: Nicholes Stairs, MD;  Location: [facility];  Service: Orthopedics;  Laterality: Left;  182min   OVARY SURGERY  1970's   "tumor removed"   PERCUTANEOUS CORONARY STENT INTERVENTION (PCI-S) N/A 06/13/2013   Procedure: PERCUTANEOUS CORONARY STENT INTERVENTION (PCI-S);  Surgeon: [name], MD;  Location: [facility] CATH LAB;  Service: Cardiovascular;  Laterality: N/A;   PERCUTANEOUS STENT INTERVENTION N/A 04/24/2013   Procedure: PERCUTANEOUS STENT INTERVENTION;  Surgeon: [name], MD;  Location: [facility] CATH LAB;  Service: Cardiovascular;  Laterality: N/A;   RIGHT/LEFT HEART CATH AND CORONARY/GRAFT ANGIOGRAPHY N/A 10/20/2017   Procedure: RIGHT/LEFT HEART CATH AND CORONARY/GRAFT ANGIOGRAPHY;  Surgeon: Larey Dresser, MD;  Location: [facility] LAB;  Service: Cardiovascular;  Laterality: N/A;   ROOT CANAL  10/2000   TEE WITHOUT CARDIOVERSION N/A 01/18/2019   Procedure: TRANSESOPHAGEAL ECHOCARDIOGRAM (TEE);  Surgeon: Larey Dresser, MD;  Location: [facility] ENDOSCOPY;  Service: Cardiovascular;  Laterality: N/A;   TIBIA FRACTURE SURGERY Right 1970's   rods and pins   TOOTH EXTRACTION     "1 on the upper; wisdom tooth on the lower" (01/16/2018)   TOTAL ABDOMINAL HYSTERECTOMY  1970's   w/ appendectomy    There were no vitals filed for this visit.   Subjective Assessment - 10/09/20 1136     Subjective back always hurts but no increased pain from ex juts muscle soreness to be expected    Currently in Pain? Yes    Pain Score 5     Pain Location Back                                [facility] Adult PT Treatment/Exercise - 10/09/20 0001       Knee/Hip Exercises: Aerobic   Nustep L 3 24min    Other Aerobic L 2 3 min fwd/3 min backward      Knee/Hip Exercises: Standing   Other Standing Knee Exercises red tband shld ext and row 2 sets 10      Knee/Hip Exercises: Seated   Long Arc Quad Strengthening;Both;10 reps;2 sets    [employer] Weight 3 lbs.    Ball Squeeze 20x    Other Seated Knee/Hip Exercises trunk flex and ext green tband 2 sets 10  Marching Strengthening;Both;10 reps;Weights;2 sets    [employer] 3 lbs.    Hamstring Curl Strengthening;Both;2 sets;10 reps   green tband   Abduction/Adduction  Strengthening;Both;2 sets;10 reps   onto 4 inch box   Abd/Adduction Weights 3 lbs.    Sit to Sand 3 sets;5 reps;without UE support   wt ball chest press                      PT Short Term Goals - 10/07/20 0909       PT SHORT TERM GOAL #1   Title Patient to be independent with initial HEP.    Status Achieved               PT Long Term Goals - 09/29/20 1143       PT LONG TERM GOAL #1   Title Patient to be independent with advanced HEP.    Time 12    Period Weeks    Status New      PT LONG TERM GOAL #2   Title Patient to demonstrate B LE strength >/=4+/5.    Time 12    Period Weeks    Status New      PT LONG TERM GOAL #3   Title Patient to score <14 sec on TUG testing in order to decrease risk of falls.    Time 12    Period Weeks    Status New      PT LONG TERM GOAL #4   Title Patient to score <13 sec on 5xSTS in order to decrease risk of falls.    Time 12    Status New      PT LONG TERM GOAL #5   Title Patient to report tolerance for 15 min of walking with LRAD without fatigue limiting.    Time 12    Period Weeks    Status New                   Plan - 10/09/20 1208     Clinical Impression Statement pt verb just muscles soreness after 1st session of ex,states LB  always hurts but no more from ex. cued through all ex engage core throughout. pt tolerated well but did need rest for fatigue and SOB    PT Treatment/Interventions ADLs/Self Care Home Management;Cryotherapy;Electrical Stimulation;Iontophoresis 4mg /ml Dexamethasone;Moist Heat;Gait training;Stair training;Functional mobility training;Therapeutic activities;Therapeutic exercise;Balance training;Neuromuscular re-education;Patient/family education;Manual techniques;Passive range of motion;Dry needling;Taping;Vasopneumatic Device;Joint Manipulations;Spinal Manipulations    PT Next Visit Plan progress HEP, ambulation, strengthening, balance             Patient will benefit from skilled therapeutic intervention in order to improve the following deficits and impairments:  Decreased balance, Difficulty walking, Decreased activity tolerance, Decreased strength, Pain  Visit Diagnosis: Muscle weakness (generalized)  Unsteadiness on feet  Debility     Problem List Patient Active Problem List   Diagnosis Date Noted   Symptomatic bradycardia 01/16/2019   Normocytic anemia 01/16/2019   Diabetic peripheral neuropathy ([place]) 01/12/2019   Neurologic abnormality 12/25/2018   Debility 09/21/2018   Hypotension 09/18/2018   Bradycardia 09/18/2018   AKI (acute kidney injury) ([place]) 09/18/2018   Pressure injury of skin 09/18/2018   Ischemic cerebrovascular accident (CVA) of frontal lobe ([place]) 09/13/2018   Fall 09/09/2018   History of CVA (cerebrovascular accident) 09/09/2018   Type II diabetes mellitus with renal manifestations ([place]) 09/09/2018   CKD (chronic kidney disease), stage III 09/09/2018   Sepsis ([place]) 09/09/2018  Depression with anxiety 09/09/2018   Slurred speech 09/09/2018   Ankle fracture, left 05/16/2018   Amiodarone pulmonary toxicity    Physical deconditioning    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    Acute on chronic congestive heart failure (HCC)    Chronic combined systolic (congestive)  and diastolic (congestive) heart failure ([place]) 79/15/0569   Chronic systolic CHF (congestive heart failure) ([place]) 01/16/2018   Atrial fibrillation ([place]) 10/21/2017   CAD (coronary artery disease) of bypass graft 10/20/2017   Coronary artery disease of bypass graft of native heart with stable angina pectoris (HCC)    Chronic low back pain 08/24/2016   Pain of left thumb 08/05/2016   Nocturnal hypoxia 06/02/2016   Hoarseness 04/05/2016   Laryngopharyngeal reflux (LPR) 04/05/2016   Pharyngoesophageal dysphagia 04/05/2016   Angina decubitus ([place]) 05/22/2015   Chronic insomnia 05/06/2015   Common migraine 05/14/2014   Abnormality of gait 05/14/2014   Memory change 05/14/2014   Aneurysm, cerebral, nonruptured 05/14/2014   Cramp of limb-Left neck 05/10/2014   Hematemesis with nausea    Vomiting blood    Dizziness and giddiness 09/21/2013   Atypical chest pain 07/18/2013   Unstable angina ([place]) 04/09/2013   Carotid artery stenosis, symptomatic 03/20/2013   Cerebrovascular disease 07/10/2012   Cerebral artery occlusion with cerebral infarction ([place]) 07/10/2012   Mitral regurgitation 04/15/2012   TIA (transient ischemic attack) 04/15/2012   Occlusion and stenosis of carotid artery without mention of cerebral infarction 08/18/2011   Bariatric surgery status 06/17/2011   Speech abnormality 03/22/2011   Dyspnea 02/24/2011   PAPILLARY MUSCLE DYSFUNCTION, NON-RHEUMATIC 10/09/2008   UNSPECIFIED VITAMIN D DEFICIENCY 10/24/2007   MYOCARDIAL INFARCTION, HX OF 10/24/2007   PERSISTENT VOMITING 10/24/2007   OSTEOARTHRITIS 10/24/2007   MIGRAINES, HX OF 10/24/2007   Diabetes mellitus type 2, controlled, with complications ([place]) 79/48/0165   Hyperlipemia 01/16/2007   Obesity-post failed open gastroplasty 1984  01/16/2007   OBSTRUCTIVE SLEEP APNEA 01/16/2007   Essential hypertension 01/16/2007   Coronary atherosclerosis 01/16/2007   Asthma 01/16/2007   GERD 01/16/2007   VENTRAL HERNIA 01/16/2007     Tramain Gershman,ANGIE, PTA 10/09/2020, 12:11 PM  [facility]. [place], [place], [address] Phone: [phone]   Fax:  [phone]  Name: NELANI SCHMELZLE [name]: [id number] Date of Birth: Feb 07, 1945

## 2020-10-14 ENCOUNTER — Ambulatory Visit: Payer: Medicare Other | Admitting: Physical Therapy

## 2020-10-14 ENCOUNTER — Other Ambulatory Visit: Payer: Self-pay

## 2020-10-14 DIAGNOSIS — R5381 Other malaise: Secondary | ICD-10-CM

## 2020-10-14 DIAGNOSIS — R262 Difficulty in walking, not elsewhere classified: Secondary | ICD-10-CM

## 2020-10-14 DIAGNOSIS — M6281 Muscle weakness (generalized): Secondary | ICD-10-CM

## 2020-10-14 DIAGNOSIS — R2681 Unsteadiness on feet: Secondary | ICD-10-CM

## 2020-10-14 NOTE — Therapy (Signed)
[place] [place], [place], [address] Phone: [phone]   Fax:  [phone]  Physical Therapy Treatment  Patient Details  Name: [name] [name] [name]: [id number] Date of Birth: 01/29/1946 Referring Provider (PT): Dr Loralie Champagne   Encounter Date: 10/14/2020   PT End of Session - 10/14/20 1127     Visit Number 4    Date for PT Re-Evaluation 12/19/20    PT Start Time 1048    PT Stop Time 1132    PT Time Calculation (min) 44 min             Past Medical History:  Diagnosis Date   Anemia    hx   Anxiety    Asthma    Basal cell carcinoma 05/2014   "left shoulder"   Bundle branch block, left    chronic/notes 07/18/2013   CHF (congestive heart failure) ([place])    Chronic insomnia 05/06/2015   Chronic kidney disease    frequency, sees dr Jamal Maes every 4 to 6 months (01/16/2018)   Chronic lower back pain    Claustrophobia    Common migraine 05/14/2014   Coronary artery disease    MI in 2001, 2002, 2006, 2011, 2014   Depression    Diabetic peripheral neuropathy ([place]) 01/12/2019   GERD (gastroesophageal reflux disease)    H/O hiatal hernia    Headache    "at least 2/month" (01/16/2018)   Heart murmur    Hyperlipidemia    Hypertension    Memory change 05/14/2014   Migraine    "5-6/year"  (01/16/2018)   Obesity 01-2010   Obstructive sleep apnea    "can't wear machine; I have claustrophobia" (01/16/2018), states she had a 2nd sleep study and does not have sleep apnea, her O2 decreases and now is on 2 L of O2 at night.   On home oxygen therapy    "2L at night and prn during daytime" (01/16/2018)   Osteoarthritis    "knees and hands" (01/16/2018)   Peripheral vascular disease ([place])    ? numbness, tingling arms and legs   PONV (postoperative nausea and vomiting)    Stroke ([place])    2014, 2015, 2016   Type II diabetes mellitus ([place])    Ventral hernia    hx of    Past Surgical History:   Procedure Laterality Date   ANKLE FRACTURE SURGERY Left 1970's   APPENDECTOMY  1970's   w/hysterectomy   BASAL CELL CARCINOMA EXCISION Left 05/2014   "shoulder" (01/16/2018)   CARDIAC CATHETERIZATION  10/10/2012   Dr [name].   CARDIAC CATHETERIZATION N/A 05/29/2015   Procedure: Right/Left Heart Cath and Coronary/Graft Angiography;  Surgeon: Larey Dresser, MD;  Location: [place];  Service: Cardiovascular;  Laterality: N/A;   CAROTID ENDARTERECTOMY Left 03/2013   CAROTID STENT INSERTION Left 03/20/2013   Procedure: CAROTID STENT INSERTION;  Surgeon: [name], MD;  Location: [facility] CATH LAB;  Service: Cardiovascular;  Laterality: Left;  internal carotid   CEREBRAL ANGIOGRAM N/A 04/05/2011   Procedure: CEREBRAL ANGIOGRAM;  Surgeon: Angelia Mould, MD;  Location: [facility] CATH LAB;  Service: Cardiovascular;  Laterality: N/A;   CHOLECYSTECTOMY OPEN  2004   CORONARY ANGIOPLASTY WITH STENT PLACEMENT  01,02,05,06,07,08,11; 04/24/2013   "I've probably got ~ 10 stents by now" (04/24/2013)   CORONARY ANGIOPLASTY WITH STENT PLACEMENT  06/13/2013   "got 4 stents today" (06/13/2013)   CORONARY ARTERY BYPASS GRAFT  1220/11   "CABG  X5"   CORONARY STENT INTERVENTION N/A 10/20/2017   Procedure: CORONARY STENT INTERVENTION;  Surgeon: Troy Sine, MD;  Location: [place] [facility] LAB;  Service: Cardiovascular;  Laterality: N/A;   ESOPHAGOGASTRODUODENOSCOPY  08/03/2011   Procedure: ESOPHAGOGASTRODUODENOSCOPY (EGD);  Surgeon: Shann Medal, MD;  Location: [facility];  Service: General;  Laterality: N/A;   ESOPHAGOGASTRODUODENOSCOPY (EGD) WITH PROPOFOL N/A 03/11/2014   Procedure: ESOPHAGOGASTRODUODENOSCOPY (EGD) WITH PROPOFOL;  Surgeon: Lafayette Dragon, MD;  Location: [facility]OSCOPY;  Service: Endoscopy;  Laterality: N/A;   FRACTURE SURGERY     gall stone removal  05/2003   GASTRIC RESTRICTION SURGERY  1984   "stapeling"   HERNIA REPAIR  2004   "in my stomach; had OR on it twice", wire mesh on 1 hernia    LEFT HEART CATH AND CORS/GRAFTS ANGIOGRAPHY N/A 07/09/2016   Procedure: LEFT HEART CATH AND CORS/GRAFTS ANGIOGRAPHY;  Surgeon: Larey Dresser, MD;  Location: [place] [facility];  Service: Cardiovascular;  Laterality: N/A;   ORIF ANKLE FRACTURE Left 05/16/2018   ORIF ANKLE FRACTURE Left 05/16/2018   Procedure: OPEN REDUCTION INTERNAL FIXATION (ORIF) Left ankle with possible syndesmosis fixation;  Surgeon: Nicholes Stairs, MD;  Location: [place];  Service: Orthopedics;  Laterality: Left;  133mn   OVARY SURGERY  1970's   "tumor removed"   PERCUTANEOUS CORONARY STENT INTERVENTION (PCI-S) N/A 06/13/2013   Procedure: PERCUTANEOUS CORONARY STENT INTERVENTION (PCI-S);  Surgeon: JJettie Booze MD;  [facility];  Service: Cardiovascular;  Laterality: N/A;   PERCUTANEOUS STENT INTERVENTION N/A 04/24/2013   Procedure: PERCUTANEOUS STENT INTERVENTION;  Surgeon: JJettie Booze MD;  [facility];  Service: Cardiovascular;  Laterality: N/A;   RIGHT/LEFT HEART CATH AND CORONARY/GRAFT ANGIOGRAPHY N/A 10/20/2017   Procedure: RIGHT/LEFT HEART CATH AND CORONARY/GRAFT ANGIOGRAPHY;  Surgeon: MLarey Dresser MD;  [facility];  Service: Cardiovascular;  Laterality: N/A;   ROOT CANAL  10/2000   TEE WITHOUT CARDIOVERSION N/A 01/18/2019   Procedure: TRANSESOPHAGEAL ECHOCARDIOGRAM (TEE);  Surgeon: MLarey Dresser MD;  [facility];  Service: Cardiovascular;  Laterality: N/A;   TIBIA FRACTURE SURGERY Right 1970's   rods and pins   TOOTH EXTRACTION     "1 on the upper; wisdom tooth on the lower" (01/16/2018)   TOTAL ABDOMINAL HYSTERECTOMY  1970's   w/ appendectomy    There were no vitals filed for this visit.   Subjective Assessment - 10/14/20 1050     Subjective back is really hurting today- something I have to live with    Currently in Pain? Yes    Pain Score 7     Pain Location Back                               [facility] Adult PT  Treatment/Exercise - 10/14/20 0001       Exercises   Exercises Lumbar      Lumbar Exercises: Machines for Strengthening   Cybex Lumbar Extension black tband 2 sets 10    Other Lumbar Machine Exercise 20# seated row and lat 2 sets 10      Knee/Hip Exercises: Aerobic   Nustep L 4 7 min    Other Aerobic L 2 2 min fwd/2 min backward      Knee/Hip Exercises: Standing   Knee Flexion Strengthening;Both;10 reps;2 sets    Knee Flexion Limitations 3# with RW    Hip Flexion Stengthening;Both;20 reps;Knee  bent;Knee straight   3# with RW alt LE   Hip Abduction Stengthening;Both;15 reps;Knee straight   3# with RW   Hip Extension Stengthening;Both;Knee straight;15 reps   with RW 3 #     Knee/Hip Exercises: Seated   Long Arc Quad Strengthening;Both;10 reps;2 sets    [employer] Weight 3 lbs.    Ball Squeeze 20x    Marching Strengthening;Both;10 reps;Weights;2 sets    [employer] 3 lbs.    Abduction/Adduction  Strengthening;Both;2 sets;10 reps    Abd/Adduction Weights 3 lbs.                       PT Short Term Goals - 10/07/20 0909       PT SHORT TERM GOAL #1   Title Patient to be independent with initial HEP.    Status Achieved               PT Long Term Goals - 10/14/20 1127       PT LONG TERM GOAL #1   Title Patient to be independent with advanced HEP.    Status On-going      PT LONG TERM GOAL #2   Title Patient to demonstrate B LE strength >/=4+/5.    Status Partially Met      PT LONG TERM GOAL #3   Title Patient to score <14 sec on TUG testing in order to decrease risk of falls.    Baseline 18.71  sec with RW    Status Partially Met                   Plan - 10/14/20 1131     Clinical Impression Statement progressed ther ex with machine interventions and she did well with cuing. sat in chair with back support for seaetd leg ex to help with LBP. progressing with TUG goal, almost halfed time and progressing with other goals. cuing with  ex for control of mvmt and activation of correct muscle.    PT Treatment/Interventions ADLs/Self Care Home Management;Cryotherapy;Electrical Stimulation;Iontophoresis 39m/ml Dexamethasone;Moist Heat;Gait training;Stair training;Functional mobility training;Therapeutic activities;Therapeutic exercise;Balance training;Neuromuscular re-education;Patient/family education;Manual techniques;Passive range of motion;Dry needling;Taping;Vasopneumatic Device;Joint Manipulations;Spinal Manipulations    PT Next Visit Plan progress HEP, ambulation, strengthening, balance             Patient will benefit from skilled therapeutic intervention in order to improve the following deficits and impairments:     Visit Diagnosis: Muscle weakness (generalized)  Unsteadiness on feet  Debility  Difficulty in walking, not elsewhere classified     Problem List Patient Active Problem List   Diagnosis Date Noted   Symptomatic bradycardia 01/16/2019   Normocytic anemia 01/16/2019   Diabetic peripheral neuropathy ([place] 01/12/2019   Neurologic abnormality 12/25/2018   Debility 09/21/2018   Hypotension 09/18/2018   Bradycardia 09/18/2018   AKI (acute kidney injury) ([place] 09/18/2018   Pressure injury of skin 09/18/2018   Ischemic cerebrovascular accident (CVA) of frontal lobe ([place] 09/13/2018   Fall 09/09/2018   History of CVA (cerebrovascular accident) 09/09/2018   Type II diabetes mellitus with renal manifestations ([place] 09/09/2018   CKD (chronic kidney disease), stage III 09/09/2018   Sepsis ([place] 09/09/2018   Depression with anxiety 09/09/2018   Slurred speech 09/09/2018   Ankle fracture, left 05/16/2018   Amiodarone pulmonary toxicity    Physical deconditioning    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    Acute on chronic congestive heart failure (HCC)    Chronic combined systolic (congestive) and  diastolic (congestive) heart failure (HCC) 72/27/7375   Chronic systolic CHF (congestive heart failure) ([place])  01/16/2018   Atrial fibrillation ([place]) 10/21/2017   CAD (coronary artery disease) of bypass graft 10/20/2017   Coronary artery disease of bypass graft of native heart with stable angina pectoris (HCC)    Chronic low back pain 08/24/2016   Pain of left thumb 08/05/2016   Nocturnal hypoxia 06/02/2016   Hoarseness 04/05/2016   Laryngopharyngeal reflux (LPR) 04/05/2016   Pharyngoesophageal dysphagia 04/05/2016   Angina decubitus ([place]) 05/22/2015   Chronic insomnia 05/06/2015   Common migraine 05/14/2014   Abnormality of gait 05/14/2014   Memory change 05/14/2014   Aneurysm, cerebral, nonruptured 05/14/2014   Cramp of limb-Left neck 05/10/2014   Hematemesis with nausea    Vomiting blood    Dizziness and giddiness 09/21/2013   Atypical chest pain 07/18/2013   Unstable angina ([place]) 04/09/2013   Carotid artery stenosis, symptomatic 03/20/2013   Cerebrovascular disease 07/10/2012   Cerebral artery occlusion with cerebral infarction ([place]) 07/10/2012   Mitral regurgitation 04/15/2012   TIA (transient ischemic attack) 04/15/2012   Occlusion and stenosis of carotid artery without mention of cerebral infarction 08/18/2011   Bariatric surgery status 06/17/2011   Speech abnormality 03/22/2011   Dyspnea 02/24/2011   PAPILLARY MUSCLE DYSFUNCTION, NON-RHEUMATIC 10/09/2008   UNSPECIFIED VITAMIN D DEFICIENCY 10/24/2007   MYOCARDIAL INFARCTION, HX OF 10/24/2007   PERSISTENT VOMITING 10/24/2007   OSTEOARTHRITIS 10/24/2007   MIGRAINES, HX OF 10/24/2007   Diabetes mellitus type 2, controlled, with complications ([place]) 06/11/7123   Hyperlipemia 01/16/2007   Obesity-post failed open gastroplasty 1984  01/16/2007   OBSTRUCTIVE SLEEP APNEA 01/16/2007   Essential hypertension 01/16/2007   Coronary atherosclerosis 01/16/2007   Asthma 01/16/2007   GERD 01/16/2007   VENTRAL HERNIA 01/16/2007    Laneshia Pina,ANGIE, PTA 10/14/2020, 11:35 AM  [place] [place], [place], [address] Phone: [phone]   Fax:  [phone]  Name: [name] [name] [name]: [id number] Date of Birth: Jul 31, 1945

## 2020-10-16 ENCOUNTER — Ambulatory Visit: Payer: Medicare Other | Admitting: Physical Therapy

## 2020-10-16 ENCOUNTER — Other Ambulatory Visit: Payer: Self-pay

## 2020-10-16 ENCOUNTER — Encounter: Payer: Self-pay | Admitting: Physical Therapy

## 2020-10-16 VITALS — BP 151/94 | HR 52

## 2020-10-16 DIAGNOSIS — R2681 Unsteadiness on feet: Secondary | ICD-10-CM

## 2020-10-16 DIAGNOSIS — R262 Difficulty in walking, not elsewhere classified: Secondary | ICD-10-CM

## 2020-10-16 DIAGNOSIS — M6281 Muscle weakness (generalized): Secondary | ICD-10-CM | POA: Diagnosis not present

## 2020-10-16 DIAGNOSIS — R2689 Other abnormalities of gait and mobility: Secondary | ICD-10-CM

## 2020-10-16 NOTE — Therapy (Signed)
[place] [place], [place], [address] Phone: [phone]   Fax:  [phone]  Physical Therapy Treatment  Patient Details  Name: [name] [name] [name]: [id number] Date of Birth: 29-Sep-1945 Referring Provider (PT): Dr Loralie Champagne   Encounter Date: 10/16/2020   PT End of Session - 10/16/20 1142     Visit Number 5    Date for PT Re-Evaluation 12/19/20    PT Start Time 1102    PT Stop Time 3762    PT Time Calculation (min) 41 min    Activity Tolerance Patient tolerated treatment well;Other (comment)   nausea   Behavior During Therapy [facility] for tasks assessed/performed             Past Medical History:  Diagnosis Date   Anemia    hx   Anxiety    Asthma    Basal cell carcinoma 05/2014   "left shoulder"   Bundle branch block, left    chronic/notes 07/18/2013   CHF (congestive heart failure) ([place])    Chronic insomnia 05/06/2015   Chronic kidney disease    frequency, sees dr Jamal Maes every 4 to 6 months (01/16/2018)   Chronic lower back pain    Claustrophobia    Common migraine 05/14/2014   Coronary artery disease    MI in 2001, 2002, 2006, 2011, 2014   Depression    Diabetic peripheral neuropathy ([place]) 01/12/2019   GERD (gastroesophageal reflux disease)    H/O hiatal hernia    Headache    "at least 2/month" (01/16/2018)   Heart murmur    Hyperlipidemia    Hypertension    Memory change 05/14/2014   Migraine    "5-6/year"  (01/16/2018)   Obesity 01-2010   Obstructive sleep apnea    "can't wear machine; I have claustrophobia" (01/16/2018), states she had a 2nd sleep study and does not have sleep apnea, her O2 decreases and now is on 2 L of O2 at night.   On home oxygen therapy    "2L at night and prn during daytime" (01/16/2018)   Osteoarthritis    "knees and hands" (01/16/2018)   Peripheral vascular disease ([place])    ? numbness, tingling arms and legs   PONV (postoperative nausea and vomiting)     Stroke ([place])    2014, 2015, 2016   Type II diabetes mellitus ([place])    Ventral hernia    hx of    Past Surgical History:  Procedure Laterality Date   ANKLE FRACTURE SURGERY Left 1970's   APPENDECTOMY  1970's   w/hysterectomy   BASAL CELL CARCINOMA EXCISION Left 05/2014   "shoulder" (01/16/2018)   CARDIAC CATHETERIZATION  10/10/2012   Dr [name].   CARDIAC CATHETERIZATION N/A 05/29/2015   Procedure: Right/Left Heart Cath and Coronary/Graft Angiography;  Surgeon: Larey Dresser, MD;  Location: [facility];  Service: Cardiovascular;  Laterality: N/A;   CAROTID ENDARTERECTOMY Left 03/2013   CAROTID STENT INSERTION Left 03/20/2013   Procedure: CAROTID STENT INSERTION;  Surgeon: [name], MD;  Location: [facility] LAB;  Service: Cardiovascular;  Laterality: Left;  internal carotid   CEREBRAL ANGIOGRAM N/A 04/05/2011   Procedure: CEREBRAL ANGIOGRAM;  Surgeon: Angelia Mould, MD;  Location: [facility] CATH LAB;  Service: Cardiovascular;  Laterality: N/A;   CHOLECYSTECTOMY OPEN  2004   CORONARY ANGIOPLASTY WITH STENT PLACEMENT  01,02,05,06,07,08,11; 04/24/2013   "I've probably got ~ 10 stents by now" (04/24/2013)   CORONARY ANGIOPLASTY WITH  STENT PLACEMENT  06/13/2013   "got 4 stents today" (06/13/2013)   CORONARY ARTERY BYPASS GRAFT  1220/11   "CABG X5"   CORONARY STENT INTERVENTION N/A 10/20/2017   Procedure: CORONARY STENT INTERVENTION;  Surgeon: Troy Sine, MD;  Location: [facility];  Service: Cardiovascular;  Laterality: N/A;   ESOPHAGOGASTRODUODENOSCOPY  08/03/2011   Procedure: ESOPHAGOGASTRODUODENOSCOPY (EGD);  Surgeon: Shann Medal, MD;  Location: [facility];  Service: General;  Laterality: N/A;   ESOPHAGOGASTRODUODENOSCOPY (EGD) WITH PROPOFOL N/A 03/11/2014   Procedure: ESOPHAGOGASTRODUODENOSCOPY (EGD) WITH PROPOFOL;  Surgeon: Lafayette Dragon, MD;  Location: [facility]OS[facility];  Service: Endoscopy;  Laterality: N/A;   FRACTURE SURGERY     gall stone removal  05/2003    GASTRIC RESTRICTION SURGERY  1984   "stapeling"   HERNIA REPAIR  2004   "in my stomach; had OR on it twice", wire mesh on 1 hernia   LEFT HEART CATH AND CORS/GRAFTS ANGIOGRAPHY N/A 07/09/2016   Procedure: LEFT HEART CATH AND CORS/GRAFTS ANGIOGRAPHY;  Surgeon: Larey Dresser, MD;  Location: [facility];  Service: Cardiovascular;  Laterality: N/A;   ORIF ANKLE FRACTURE Left 05/16/2018   ORIF ANKLE FRACTURE Left 05/16/2018   Procedure: OPEN REDUCTION INTERNAL FIXATION (ORIF) Left ankle with possible syndesmosis fixation;  Surgeon: Nicholes Stairs, MD;  Location: [facility];  Service: Orthopedics;  Laterality: Left;  151mn   OVARY SURGERY  1970's   "tumor removed"   PERCUTANEOUS CORONARY STENT INTERVENTION (PCI-S) N/A 06/13/2013   Procedure: PERCUTANEOUS CORONARY STENT INTERVENTION (PCI-S);  Surgeon: JJettie Booze MD;  [facility];  Service: Cardiovascular;  Laterality: N/A;   PERCUTANEOUS STENT INTERVENTION N/A 04/24/2013   Procedure: PERCUTANEOUS STENT INTERVENTION;  Surgeon: JJettie Booze MD;  [facility];  Service: Cardiovascular;  Laterality: N/A;   RIGHT/LEFT HEART CATH AND CORONARY/GRAFT ANGIOGRAPHY N/A 10/20/2017   Procedure: RIGHT/LEFT HEART CATH AND CORONARY/GRAFT ANGIOGRAPHY;  Surgeon: MLarey Dresser MD;  [facility];  Service: Cardiovascular;  Laterality: N/A;   ROOT CANAL  10/2000   TEE WITHOUT CARDIOVERSION N/A 01/18/2019   Procedure: TRANSESOPHAGEAL ECHOCARDIOGRAM (TEE);  Surgeon: MLarey Dresser MD;  [facility];  Service: Cardiovascular;  Laterality: N/A;   TIBIA FRACTURE SURGERY Right 1970's   rods and pins   TOOTH EXTRACTION     "1 on the upper; wisdom tooth on the lower" (01/16/2018)   TOTAL ABDOMINAL HYSTERECTOMY  1970's   w/ appendectomy    Vitals:   10/16/20 1106  BP: (!) 151/94  Pulse: (!) 52  SpO2: 98%     Subjective Assessment - 10/16/20 1103     Subjective Had some muscle soreness after  doing her new HEP. Back pain is still there. Notes that she did some things around the house that she probably shouldn't have done but her husband was out of town.  Denies falls recently.    Pertinent History CHF, Pneumonia    Patient Stated Goals I want to be able to stand up without falling over.    Currently in Pain? Yes    Pain Score 6     Pain Location Back    Pain Orientation Lower    Pain Descriptors / Indicators Aching;Jabbing    Pain Type Chronic pain                               [facility] Adult PT Treatment/Exercise -  10/16/20 0001       Knee/Hip Exercises: Stretches   Other Knee/Hip Stretches prayer stretch 10x5"      Knee/Hip Exercises: Aerobic   Nustep L 4x 6 min      Knee/Hip Exercises: Standing   Heel Raises Both;1 set;15 reps    Heel Raises Limitations in 4WW    Knee Flexion Strengthening;Both;10 reps;2 sets    Knee Flexion Limitations 3# with 1OX    Hip Flexion Stengthening;Both;1 set;20 reps;Knee bent    Hip Flexion Limitations resisted match at 4WW 3#      Knee/Hip Exercises: Seated   Long Arc Quad Strengthening;Both;10 reps;2 sets    [employer] Weight 3 lbs.    Long [employer] Limitations cues for slow descent    Marching Strengthening;Both;Weights;2 sets;20 reps    [employer] 3 lbs.    Hamstring Curl Strengthening;Both;2 sets;15 reps   green TB   Sit to Sand 3 sets;without UE support   last 2 sets with blue TB at anterior pelvis                      PT Short Term Goals - 10/07/20 0909       PT SHORT TERM GOAL #1   Title Patient to be independent with initial HEP.    Status Achieved               PT Long Term Goals - 10/14/20 1127       PT LONG TERM GOAL #1   Title Patient to be independent with advanced HEP.    Status On-going      PT LONG TERM GOAL #2   Title Patient to demonstrate B LE strength >/=4+/5.    Status Partially Met      PT LONG TERM GOAL #3   Title Patient to score <14 sec on TUG  testing in order to decrease risk of falls.    Baseline 18.71  sec with RW    Status Partially Met                   Plan - 10/16/20 1145     Clinical Impression Statement Patient arrived to session with report of continued LBP. Noted some muscle soreness from HEP but otherwise okay. Denies falls since last session. BP elevated at start of session, however patient asymptomatic. Proceeded with session while monitoring for symptoms. Performed sitting ther-ex well and with good muscle control. After several sitting exercises, patient reported nausea and reported that she felt that she was going to become emetic. Able to ambulate to the bathroom without assistance. Patient did not become emetic and able to resume ther-ex after short break. Worked on short duration of standing ther-ex with cueing to maintain upright trunk. Patient without complaints at end of session.    Comorbidities CHF, Pneumonia    Examination-Participation Restrictions Occupation;Community Activity    PT Treatment/Interventions ADLs/Self Care Home Management;Cryotherapy;Electrical Stimulation;Iontophoresis 31m/ml Dexamethasone;Moist Heat;Gait training;Stair training;Functional mobility training;Therapeutic activities;Therapeutic exercise;Balance training;Neuromuscular re-education;Patient/family education;Manual techniques;Passive range of motion;Dry needling;Taping;Vasopneumatic Device;Joint Manipulations;Spinal Manipulations    PT Next Visit Plan progress HEP, ambulation, strengthening, balance    Consulted and Agree with Plan of Care Patient             Patient will benefit from skilled therapeutic intervention in order to improve the following deficits and impairments:     Visit Diagnosis: Unsteadiness on feet  Muscle weakness (generalized)  Other abnormalities of gait and mobility  Difficulty  in walking, not elsewhere classified     Problem List Patient Active Problem List   Diagnosis Date Noted    Symptomatic bradycardia 01/16/2019   Normocytic anemia 01/16/2019   Diabetic peripheral neuropathy ([place]) 01/12/2019   Neurologic abnormality 12/25/2018   Debility 09/21/2018   Hypotension 09/18/2018   Bradycardia 09/18/2018   AKI (acute kidney injury) ([place]) 09/18/2018   Pressure injury of skin 09/18/2018   Ischemic cerebrovascular accident (CVA) of frontal lobe ([place]) 09/13/2018   Fall 09/09/2018   History of CVA (cerebrovascular accident) 09/09/2018   Type II diabetes mellitus with renal manifestations ([place]) 09/09/2018   CKD (chronic kidney disease), stage III 09/09/2018   Sepsis ([place]) 09/09/2018   Depression with anxiety 09/09/2018   Slurred speech 09/09/2018   Ankle fracture, left 05/16/2018   Amiodarone pulmonary toxicity    Physical deconditioning    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    Acute on chronic congestive heart failure ([place])    Chronic combined systolic (congestive) and diastolic (congestive) heart failure ([place]) 70/26/3785   Chronic systolic CHF (congestive heart failure) ([place]) 01/16/2018   Atrial fibrillation ([place]) 10/21/2017   CAD (coronary artery disease) of bypass graft 10/20/2017   Coronary artery disease of bypass graft of native heart with stable angina pectoris (HCC)    Chronic low back pain 08/24/2016   Pain of left thumb 08/05/2016   Nocturnal hypoxia 06/02/2016   Hoarseness 04/05/2016   Laryngopharyngeal reflux (LPR) 04/05/2016   Pharyngoesophageal dysphagia 04/05/2016   Angina decubitus ([place]) 05/22/2015   Chronic insomnia 05/06/2015   Common migraine 05/14/2014   Abnormality of gait 05/14/2014   Memory change 05/14/2014   Aneurysm, cerebral, nonruptured 05/14/2014   Cramp of limb-Left neck 05/10/2014   Hematemesis with nausea    Vomiting blood    Dizziness and giddiness 09/21/2013   Atypical chest pain 07/18/2013   Unstable angina (HCC) 04/09/2013   Carotid artery stenosis, symptomatic 03/20/2013   Cerebrovascular disease 07/10/2012   Cerebral artery  occlusion with cerebral infarction ([place]) 07/10/2012   Mitral regurgitation 04/15/2012   TIA (transient ischemic attack) 04/15/2012   Occlusion and stenosis of carotid artery without mention of cerebral infarction 08/18/2011   Bariatric surgery status 06/17/2011   Speech abnormality 03/22/2011   Dyspnea 02/24/2011   PAPILLARY MUSCLE DYSFUNCTION, NON-RHEUMATIC 10/09/2008   UNSPECIFIED VITAMIN D DEFICIENCY 10/24/2007   MYOCARDIAL INFARCTION, HX OF 10/24/2007   PERSISTENT VOMITING 10/24/2007   OSTEOARTHRITIS 10/24/2007   MIGRAINES, HX OF 10/24/2007   Diabetes mellitus type 2, controlled, with complications ([place]) 88/50/2774   Hyperlipemia 01/16/2007   Obesity-post failed open gastroplasty 1984  01/16/2007   OBSTRUCTIVE SLEEP APNEA 01/16/2007   Essential hypertension 01/16/2007   Coronary atherosclerosis 01/16/2007   Asthma 01/16/2007   GERD 01/16/2007   VENTRAL HERNIA 01/16/2007    Janene Harvey, PT, DPT 10/16/20 11:53 AM    [address] [place], [place], [address] Phone: [phone]   Fax:  [phone]  Name: RAFAELA DINIUS [name]: [id number] Date of Birth: 1945/10/22

## 2020-10-21 ENCOUNTER — Ambulatory Visit: Payer: Medicare Other | Admitting: Physical Therapy

## 2020-10-21 ENCOUNTER — Other Ambulatory Visit: Payer: Self-pay

## 2020-10-21 DIAGNOSIS — M6281 Muscle weakness (generalized): Secondary | ICD-10-CM | POA: Diagnosis not present

## 2020-10-21 DIAGNOSIS — R262 Difficulty in walking, not elsewhere classified: Secondary | ICD-10-CM

## 2020-10-21 DIAGNOSIS — R2681 Unsteadiness on feet: Secondary | ICD-10-CM

## 2020-10-21 NOTE — Therapy (Signed)
[address] [place], [place], [address] Phone: [phone]   Fax:  [phone]  Physical Therapy Treatment  Patient Details  Name: [name] [name] [name]: [id number] Date of Birth: 1945/09/18 Referring Provider (PT): Dr Loralie Champagne   Encounter Date: 10/21/2020   PT End of Session - 10/21/20 1137     Visit Number 6    Date for PT Re-Evaluation 12/19/20    PT Start Time 1056    PT Stop Time 1140    PT Time Calculation (min) 44 min             Past Medical History:  Diagnosis Date   Anemia    hx   Anxiety    Asthma    Basal cell carcinoma 05/2014   "left shoulder"   Bundle branch block, left    chronic/notes 07/18/2013   CHF (congestive heart failure) ([place])    Chronic insomnia 05/06/2015   Chronic kidney disease    frequency, sees dr Jamal Maes every 4 to 6 months (01/16/2018)   Chronic lower back pain    Claustrophobia    Common migraine 05/14/2014   Coronary artery disease    MI in 2001, 2002, 2006, 2011, 2014   Depression    Diabetic peripheral neuropathy ([place]) 01/12/2019   GERD (gastroesophageal reflux disease)    H/O hiatal hernia    Headache    "at least 2/month" (01/16/2018)   Heart murmur    Hyperlipidemia    Hypertension    Memory change 05/14/2014   Migraine    "5-6/year"  (01/16/2018)   Obesity 01-2010   Obstructive sleep apnea    "can't wear machine; I have claustrophobia" (01/16/2018), states she had a 2nd sleep study and does not have sleep apnea, her O2 decreases and now is on 2 L of O2 at night.   On home oxygen therapy    "2L at night and prn during daytime" (01/16/2018)   Osteoarthritis    "knees and hands" (01/16/2018)   Peripheral vascular disease ([place])    ? numbness, tingling arms and legs   PONV (postoperative nausea and vomiting)    Stroke ([place])    2014, 2015, 2016   Type II diabetes mellitus ([place])    Ventral hernia    hx of    Past Surgical History:   Procedure Laterality Date   ANKLE FRACTURE SURGERY Left 1970's   APPENDECTOMY  1970's   w/hysterectomy   BASAL CELL CARCINOMA EXCISION Left 05/2014   "shoulder" (01/16/2018)   CARDIAC CATHETERIZATION  10/10/2012   Dr [name].   CARDIAC CATHETERIZATION N/A 05/29/2015   Procedure: Right/Left Heart Cath and Coronary/Graft Angiography;  Surgeon: Larey Dresser, MD;  Location: [facility];  Service: Cardiovascular;  Laterality: N/A;   CAROTID ENDARTERECTOMY Left 03/2013   CAROTID STENT INSERTION Left 03/20/2013   Procedure: CAROTID STENT INSERTION;  Surgeon: [name], MD;  Location: [facility] CATH LAB;  Service: Cardiovascular;  Laterality: Left;  internal carotid   CEREBRAL ANGIOGRAM N/A 04/05/2011   Procedure: CEREBRAL ANGIOGRAM;  Surgeon: Angelia Mould, MD;  Location: [facility] CATH LAB;  Service: Cardiovascular;  Laterality: N/A;   CHOLECYSTECTOMY OPEN  2004   CORONARY ANGIOPLASTY WITH STENT PLACEMENT  01,02,05,06,07,08,11; 04/24/2013   "I've probably got ~ 10 stents by now" (04/24/2013)   CORONARY ANGIOPLASTY WITH STENT PLACEMENT  06/13/2013   "got 4 stents today" (06/13/2013)   CORONARY ARTERY BYPASS GRAFT  1220/11   "CABG  X5"   CORONARY STENT INTERVENTION N/A 10/20/2017   Procedure: CORONARY STENT INTERVENTION;  Surgeon: Troy Sine, MD;  Location: [facility];  Service: Cardiovascular;  Laterality: N/A;   ESOPHAGOGASTRODUODENOSCOPY  08/03/2011   Procedure: ESOPHAGOGASTRODUODENOSCOPY (EGD);  Surgeon: Shann Medal, MD;  Location: [facility];  Service: General;  Laterality: N/A;   ESOPHAGOGASTRODUODENOSCOPY (EGD) WITH PROPOFOL N/A 03/11/2014   Procedure: ESOPHAGOGASTRODUODENOSCOPY (EGD) WITH PROPOFOL;  Surgeon: Lafayette Dragon, MD;  Location: [facility];  Service: Endoscopy;  Laterality: N/A;   FRACTURE SURGERY     gall stone removal  05/2003   GASTRIC RESTRICTION SURGERY  1984   "stapeling"   HERNIA REPAIR  2004   "in my stomach; had OR on it twice", wire mesh on 1 hernia    LEFT HEART CATH AND CORS/GRAFTS ANGIOGRAPHY N/A 07/09/2016   Procedure: LEFT HEART CATH AND CORS/GRAFTS ANGIOGRAPHY;  Surgeon: Larey Dresser, MD;  Location: [place] [facility];  Service: Cardiovascular;  Laterality: N/A;   ORIF ANKLE FRACTURE Left 05/16/2018   ORIF ANKLE FRACTURE Left 05/16/2018   Procedure: OPEN REDUCTION INTERNAL FIXATION (ORIF) Left ankle with possible syndesmosis fixation;  Surgeon: Nicholes Stairs, MD;  Location: [facility];  Service: Orthopedics;  Laterality: Left;  1105mn   OVARY SURGERY  1970's   "tumor removed"   PERCUTANEOUS CORONARY STENT INTERVENTION (PCI-S) N/A 06/13/2013   Procedure: PERCUTANEOUS CORONARY STENT INTERVENTION (PCI-S);  Surgeon: JJettie Booze MD;  [facility];  Service: Cardiovascular;  Laterality: N/A;   PERCUTANEOUS STENT INTERVENTION N/A 04/24/2013   Procedure: PERCUTANEOUS STENT INTERVENTION;  Surgeon: JJettie Booze MD;  [facility];  Service: Cardiovascular;  Laterality: N/A;   RIGHT/LEFT HEART CATH AND CORONARY/GRAFT ANGIOGRAPHY N/A 10/20/2017   Procedure: RIGHT/LEFT HEART CATH AND CORONARY/GRAFT ANGIOGRAPHY;  Surgeon: MLarey Dresser MD;  [facility];  Service: Cardiovascular;  Laterality: N/A;   ROOT CANAL  10/2000   TEE WITHOUT CARDIOVERSION N/A 01/18/2019   Procedure: TRANSESOPHAGEAL ECHOCARDIOGRAM (TEE);  Surgeon: MLarey Dresser MD;  [facility];  Service: Cardiovascular;  Laterality: N/A;   TIBIA FRACTURE SURGERY Right 1970's   rods and pins   TOOTH EXTRACTION     "1 on the upper; wisdom tooth on the lower" (01/16/2018)   TOTAL ABDOMINAL HYSTERECTOMY  1970's   w/ appendectomy    There were no vitals filed for this visit.   Subjective Assessment - 10/21/20 1058     Subjective normal aches and pains in back. dropped yeti cup on big toe so it hurts                               [facility] Adult PT Treatment/Exercise - 10/21/20 0001       Transfers    Five time sit to stand comments  28.6 sec from arm chair using arms but able to come to full standing and release hands form arm chair      Lumbar Exercises: Machines for Strengthening   Cybex Lumbar Extension black tband 2 sets 10   flex and ext     Lumbar Exercises: Standing   Other Standing Lumbar Exercises red tband shld ext,abd,ER and row 10x      Lumbar Exercises: Seated   Other Seated Lumbar Exercises iso core work      Knee/Hip Exercises: Aerobic   Nustep L 5 8 min      Knee/Hip Exercises:  Machines for Strengthening   Cybex Knee Extension 5# 2 sets 10    Cybex Knee Flexion 20# 2 sets 10      Knee/Hip Exercises: Seated   Ball Squeeze 20x    Clamshell with TheraBand Blue   20x   Marching Strengthening;Both;20 reps;Weights    Marching Weights 3 lbs.                       PT Short Term Goals - 10/07/20 0909       PT SHORT TERM GOAL #1   Title Patient to be independent with initial HEP.    Status Achieved               PT Long Term Goals - 10/21/20 1131       PT LONG TERM GOAL #1   Title Patient to be independent with advanced HEP.    Status Partially Met      PT LONG TERM GOAL #2   Title Patient to demonstrate B LE strength >/=4+/5.    Status Partially Met      PT LONG TERM GOAL #3   Title Patient to score <14 sec on TUG testing in order to decrease risk of falls.    Baseline 14.13  sec with RW    Status Partially Met      PT LONG TERM GOAL #4   Title Patient to score <13 sec on 5xSTS in order to decrease risk of falls.    Status Partially Met      PT LONG TERM GOAL #5   Title Patient to report tolerance for 15 min of walking with LRAD without fatigue limiting.    Status On-going                   Plan - 10/21/20 1137     Clinical Impression Statement pt is progressing with goals. STS and TUG times have improved. pt states doin gmore at home but still limited in standing to 2-3 min at home. progressed ther ex with time,  machine interventions and core ex - cuing needed    PT Treatment/Interventions ADLs/Self Care Home Management;Cryotherapy;Electrical Stimulation;Iontophoresis 48m/ml Dexamethasone;Moist Heat;Gait training;Stair training;Functional mobility training;Therapeutic activities;Therapeutic exercise;Balance training;Neuromuscular re-education;Patient/family education;Manual techniques;Passive range of motion;Dry needling;Taping;Vasopneumatic Device;Joint Manipulations;Spinal Manipulations    PT Next Visit Plan progress strength ( hips and core ) esp in standing             Patient will benefit from skilled therapeutic intervention in order to improve the following deficits and impairments:  Decreased balance, Difficulty walking, Decreased activity tolerance, Decreased strength, Pain  Visit Diagnosis: Unsteadiness on feet  Muscle weakness (generalized)  Difficulty in walking, not elsewhere classified     Problem List Patient Active Problem List   Diagnosis Date Noted   Symptomatic bradycardia 01/16/2019   Normocytic anemia 01/16/2019   Diabetic peripheral neuropathy ([place] 01/12/2019   Neurologic abnormality 12/25/2018   Debility 09/21/2018   Hypotension 09/18/2018   Bradycardia 09/18/2018   AKI (acute kidney injury) ([place] 09/18/2018   Pressure injury of skin 09/18/2018   Ischemic cerebrovascular accident (CVA) of frontal lobe ([place] 09/13/2018   Fall 09/09/2018   History of CVA (cerebrovascular accident) 09/09/2018   Type II diabetes mellitus with renal manifestations ([place] 09/09/2018   CKD (chronic kidney disease), stage III 09/09/2018   Sepsis ([place] 09/09/2018   Depression with anxiety 09/09/2018   Slurred speech 09/09/2018   Ankle fracture, left 05/16/2018   Amiodarone pulmonary  toxicity    Physical deconditioning    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    Acute on chronic congestive heart failure (HCC)    Chronic combined systolic (congestive) and diastolic (congestive) heart failure  ([place]) 25/06/3974   Chronic systolic CHF (congestive heart failure) ([place]) 01/16/2018   Atrial fibrillation ([place]) 10/21/2017   CAD (coronary artery disease) of bypass graft 10/20/2017   Coronary artery disease of bypass graft of native heart with stable angina pectoris (HCC)    Chronic low back pain 08/24/2016   Pain of left thumb 08/05/2016   Nocturnal hypoxia 06/02/2016   Hoarseness 04/05/2016   Laryngopharyngeal reflux (LPR) 04/05/2016   Pharyngoesophageal dysphagia 04/05/2016   Angina decubitus ([place]) 05/22/2015   Chronic insomnia 05/06/2015   Common migraine 05/14/2014   Abnormality of gait 05/14/2014   Memory change 05/14/2014   Aneurysm, cerebral, nonruptured 05/14/2014   Cramp of limb-Left neck 05/10/2014   Hematemesis with nausea    Vomiting blood    Dizziness and giddiness 09/21/2013   Atypical chest pain 07/18/2013   Unstable angina ([place]) 04/09/2013   Carotid artery stenosis, symptomatic 03/20/2013   Cerebrovascular disease 07/10/2012   Cerebral artery occlusion with cerebral infarction ([place]) 07/10/2012   Mitral regurgitation 04/15/2012   TIA (transient ischemic attack) 04/15/2012   Occlusion and stenosis of carotid artery without mention of cerebral infarction 08/18/2011   Bariatric surgery status 06/17/2011   Speech abnormality 03/22/2011   Dyspnea 02/24/2011   PAPILLARY MUSCLE DYSFUNCTION, NON-RHEUMATIC 10/09/2008   UNSPECIFIED VITAMIN D DEFICIENCY 10/24/2007   MYOCARDIAL INFARCTION, HX OF 10/24/2007   PERSISTENT VOMITING 10/24/2007   OSTEOARTHRITIS 10/24/2007   MIGRAINES, HX OF 10/24/2007   Diabetes mellitus type 2, controlled, with complications ([place]) 73/41/9379   Hyperlipemia 01/16/2007   Obesity-post failed open gastroplasty 1984  01/16/2007   OBSTRUCTIVE SLEEP APNEA 01/16/2007   Essential hypertension 01/16/2007   Coronary atherosclerosis 01/16/2007   Asthma 01/16/2007   GERD 01/16/2007   VENTRAL HERNIA 01/16/2007    Thurmon Mizell,ANGIE, PTA 10/21/2020,  11:40 AM  [address] [place], [place], [address] Phone: [phone]   Fax:  [phone]  Name: [name] [name] [name]: [id number] Date of Birth: 1945-05-17

## 2020-10-23 ENCOUNTER — Ambulatory Visit: Payer: Medicare Other | Admitting: Physical Therapy

## 2020-10-23 ENCOUNTER — Other Ambulatory Visit: Payer: Self-pay

## 2020-10-23 DIAGNOSIS — M6281 Muscle weakness (generalized): Secondary | ICD-10-CM

## 2020-10-23 DIAGNOSIS — R262 Difficulty in walking, not elsewhere classified: Secondary | ICD-10-CM

## 2020-10-23 NOTE — Therapy (Signed)
[address] [place], [place], [address] Phone: [phone]   Fax:  [phone]  Physical Therapy Treatment  Patient Details  Name: [name] [name] [name]: [id number] Date of Birth: 01/01/1946 Referring Provider (PT): Dr Loralie Champagne   Encounter Date: 10/23/2020   PT End of Session - 10/23/20 1130     Visit Number 7    Date for PT Re-Evaluation 12/19/20    PT Start Time 1053    PT Stop Time 1138    PT Time Calculation (min) 45 min             Past Medical History:  Diagnosis Date   Anemia    hx   Anxiety    Asthma    Basal cell carcinoma 05/2014   "left shoulder"   Bundle branch block, left    chronic/notes 07/18/2013   CHF (congestive heart failure) ([place])    Chronic insomnia 05/06/2015   Chronic kidney disease    frequency, sees dr Jamal Maes every 4 to 6 months (01/16/2018)   Chronic lower back pain    Claustrophobia    Common migraine 05/14/2014   Coronary artery disease    MI in 2001, 2002, 2006, 2011, 2014   Depression    Diabetic peripheral neuropathy ([place]) 01/12/2019   GERD (gastroesophageal reflux disease)    H/O hiatal hernia    Headache    "at least 2/month" (01/16/2018)   Heart murmur    Hyperlipidemia    Hypertension    Memory change 05/14/2014   Migraine    "5-6/year"  (01/16/2018)   Obesity 01-2010   Obstructive sleep apnea    "can't wear machine; I have claustrophobia" (01/16/2018), states she had a 2nd sleep study and does not have sleep apnea, her O2 decreases and now is on 2 L of O2 at night.   On home oxygen therapy    "2L at night and prn during daytime" (01/16/2018)   Osteoarthritis    "knees and hands" (01/16/2018)   Peripheral vascular disease ([place])    ? numbness, tingling arms and legs   PONV (postoperative nausea and vomiting)    Stroke ([place])    2014, 2015, 2016   Type II diabetes mellitus ([place])    Ventral hernia    hx of    Past Surgical History:   Procedure Laterality Date   ANKLE FRACTURE SURGERY Left 1970's   APPENDECTOMY  1970's   w/hysterectomy   BASAL CELL CARCINOMA EXCISION Left 05/2014   "shoulder" (01/16/2018)   CARDIAC CATHETERIZATION  10/10/2012   Dr [name].   CARDIAC CATHETERIZATION N/A 05/29/2015   Procedure: Right/Left Heart Cath and Coronary/Graft Angiography;  Surgeon: Larey Dresser, MD;  Location: [facility];  Service: Cardiovascular;  Laterality: N/A;   CAROTID ENDARTERECTOMY Left 03/2013   CAROTID STENT INSERTION Left 03/20/2013   Procedure: CAROTID STENT INSERTION;  Surgeon: [name], MD;  [facility] C[facility] LAB;  Service: Cardiovascular;  Laterality: Left;  internal carotid   CEREBRAL ANGIOGRAM N/A 04/05/2011   Procedure: CEREBRAL ANGIOGRAM;  Surgeon: Angelia Mould, MD;  Location: [facility] CATH LAB;  Service: Cardiovascular;  Laterality: N/A;   CHOLECYSTECTOMY OPEN  2004   CORONARY ANGIOPLASTY WITH STENT PLACEMENT  01,02,05,06,07,08,11; 04/24/2013   "I've probably got ~ 10 stents by now" (04/24/2013)   CORONARY ANGIOPLASTY WITH STENT PLACEMENT  06/13/2013   "got 4 stents today" (06/13/2013)   CORONARY ARTERY BYPASS GRAFT  1220/11   "CABG  X5"   CORONARY STENT INTERVENTION N/A 10/20/2017   Procedure: CORONARY STENT INTERVENTION;  Surgeon: Troy Sine, MD;  Location: [place] [facility];  Service: Cardiovascular;  Laterality: N/A;   ESOPHAGOGASTRODUODENOSCOPY  08/03/2011   Procedure: ESOPHAGOGASTRODUODENOSCOPY (EGD);  Surgeon: Shann Medal, MD;  [facility];  Service: General;  Laterality: N/A;   ESOPHAGOGASTRODUODENOSCOPY (EGD) WITH PROPOFOL N/A 03/11/2014   Procedure: ESOPHAGOGASTRODUODENOSCOPY (EGD) WITH PROPOFOL;  Surgeon: Lafayette Dragon, MD;  [facility];  Service: Endoscopy;  Laterality: N/A;   FRACTURE SURGERY     gall stone removal  05/2003   GASTRIC RESTRICTION SURGERY  1984   "stapeling"   HERNIA REPAIR  2004   "in my stomach; had OR on it twice", wire mesh on 1 hernia    LEFT HEART CATH AND CORS/GRAFTS ANGIOGRAPHY N/A 07/09/2016   Procedure: LEFT HEART CATH AND CORS/GRAFTS ANGIOGRAPHY;  Surgeon: Larey Dresser, MD;  Location: [place] [facility];  Service: Cardiovascular;  Laterality: N/A;   ORIF ANKLE FRACTURE Left 05/16/2018   ORIF ANKLE FRACTURE Left 05/16/2018   Procedure: OPEN REDUCTION INTERNAL FIXATION (ORIF) Left ankle with possible syndesmosis fixation;  Surgeon: Nicholes Stairs, MD;  Location: [facility];  Service: Orthopedics;  Laterality: Left;  179mn   OVARY SURGERY  1970's   "tumor removed"   PERCUTANEOUS CORONARY STENT INTERVENTION (PCI-S) N/A 06/13/2013   Procedure: PERCUTANEOUS CORONARY STENT INTERVENTION (PCI-S);  Surgeon: JJettie Booze MD;  [facility];  Service: Cardiovascular;  Laterality: N/A;   PERCUTANEOUS STENT INTERVENTION N/A 04/24/2013   Procedure: PERCUTANEOUS STENT INTERVENTION;  Surgeon: JJettie Booze MD;  [facility];  Service: Cardiovascular;  Laterality: N/A;   RIGHT/LEFT HEART CATH AND CORONARY/GRAFT ANGIOGRAPHY N/A 10/20/2017   Procedure: RIGHT/LEFT HEART CATH AND CORONARY/GRAFT ANGIOGRAPHY;  Surgeon: MLarey Dresser MD;  [facility];  Service: Cardiovascular;  Laterality: N/A;   ROOT CANAL  10/2000   TEE WITHOUT CARDIOVERSION N/A 01/18/2019   Procedure: TRANSESOPHAGEAL ECHOCARDIOGRAM (TEE);  Surgeon: MLarey Dresser MD;  [facility];  Service: Cardiovascular;  Laterality: N/A;   TIBIA FRACTURE SURGERY Right 1970's   rods and pins   TOOTH EXTRACTION     "1 on the upper; wisdom tooth on the lower" (01/16/2018)   TOTAL ABDOMINAL HYSTERECTOMY  1970's   w/ appendectomy    There were no vitals filed for this visit.   Subjective Assessment - 10/23/20 1055     Subjective back is usual pain. "legs were muscle sore from last session- but that is good"    Currently in Pain? Yes    Pain Score 6     Pain Location Back                                [facility] Adult PT Treatment/Exercise - 10/23/20 0001       Lumbar Exercises: Aerobic   UBE (Upper Arm Bike) L 2 3 min fwd/3 min back      Lumbar Exercises: Machines for Strengthening   Cybex Lumbar Extension black tband 2 sets 10   flex and ext     Lumbar Exercises: Seated   Other Seated Lumbar Exercises iso core work    Other Seated Lumbar Exercises shld ext and row green tband 2 sets 10      Knee/Hip Exercises: Aerobic   Nustep L 5 8 min      Knee/Hip  Exercises: Machines for Strengthening   Cybex Knee Extension 5# 2 sets 10    Cybex Knee Flexion 20# 2 sets 10      Knee/Hip Exercises: Standing   Walking with Sports Cord 20# 5  fwd/back CG-MIn   RT leg gave out with fatigue. pt verb usually left   Other Standing Knee Exercises hip flex,ext and abd 10 x BIL with RW   red tband                      PT Short Term Goals - 10/07/20 0909       PT SHORT TERM GOAL #1   Title Patient to be independent with initial HEP.    Status Achieved               PT Long Term Goals - 10/21/20 1131       PT LONG TERM GOAL #1   Title Patient to be independent with advanced HEP.    Status Partially Met      PT LONG TERM GOAL #2   Title Patient to demonstrate B LE strength >/=4+/5.    Status Partially Met      PT LONG TERM GOAL #3   Title Patient to score <14 sec on TUG testing in order to decrease risk of falls.    Baseline 14.13  sec with RW    Status Partially Met      PT LONG TERM GOAL #4   Title Patient to score <13 sec on 5xSTS in order to decrease risk of falls.    Status Partially Met      PT LONG TERM GOAL #5   Title Patient to report tolerance for 15 min of walking with LRAD without fatigue limiting.    Status On-going                   Plan - 10/23/20 1130     Clinical Impression Statement cues with ex to engage core. progressing well with strengthening and working on stamina.    PT  Treatment/Interventions ADLs/Self Care Home Management;Cryotherapy;Electrical Stimulation;Iontophoresis 49m/ml Dexamethasone;Moist Heat;Gait training;Stair training;Functional mobility training;Therapeutic activities;Therapeutic exercise;Balance training;Neuromuscular re-education;Patient/family education;Manual techniques;Passive range of motion;Dry needling;Taping;Vasopneumatic Device;Joint Manipulations;Spinal Manipulations    PT Next Visit Plan progress strength ( hips and core ) esp in standing             Patient will benefit from skilled therapeutic intervention in order to improve the following deficits and impairments:  Decreased balance, Difficulty walking, Decreased activity tolerance, Decreased strength, Pain  Visit Diagnosis: Muscle weakness (generalized)  Difficulty in walking, not elsewhere classified     Problem List Patient Active Problem List   Diagnosis Date Noted   Symptomatic bradycardia 01/16/2019   Normocytic anemia 01/16/2019   Diabetic peripheral neuropathy ([place] 01/12/2019   Neurologic abnormality 12/25/2018   Debility 09/21/2018   Hypotension 09/18/2018   Bradycardia 09/18/2018   AKI (acute kidney injury) ([place] 09/18/2018   Pressure injury of skin 09/18/2018   Ischemic cerebrovascular accident (CVA) of frontal lobe ([place] 09/13/2018   Fall 09/09/2018   History of CVA (cerebrovascular accident) 09/09/2018   Type II diabetes mellitus with renal manifestations ([place] 09/09/2018   CKD (chronic kidney disease), stage III 09/09/2018   Sepsis ([place] 09/09/2018   Depression with anxiety 09/09/2018   Slurred speech 09/09/2018   Ankle fracture, left 05/16/2018   Amiodarone pulmonary toxicity    Physical deconditioning    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  Acute on chronic congestive heart failure (HCC)    Chronic combined systolic (congestive) and diastolic (congestive) heart failure ([place]) 03/14/1733   Chronic systolic CHF (congestive heart failure) ([place])  01/16/2018   Atrial fibrillation ([place]) 10/21/2017   CAD (coronary artery disease) of bypass graft 10/20/2017   Coronary artery disease of bypass graft of native heart with stable angina pectoris (HCC)    Chronic low back pain 08/24/2016   Pain of left thumb 08/05/2016   Nocturnal hypoxia 06/02/2016   Hoarseness 04/05/2016   Laryngopharyngeal reflux (LPR) 04/05/2016   Pharyngoesophageal dysphagia 04/05/2016   Angina decubitus ([place]) 05/22/2015   Chronic insomnia 05/06/2015   Common migraine 05/14/2014   Abnormality of gait 05/14/2014   Memory change 05/14/2014   Aneurysm, cerebral, nonruptured 05/14/2014   Cramp of limb-Left neck 05/10/2014   Hematemesis with nausea    Vomiting blood    Dizziness and giddiness 09/21/2013   Atypical chest pain 07/18/2013   Unstable angina ([place]) 04/09/2013   Carotid artery stenosis, symptomatic 03/20/2013   Cerebrovascular disease 07/10/2012   Cerebral artery occlusion with cerebral infarction ([place]) 07/10/2012   Mitral regurgitation 04/15/2012   TIA (transient ischemic attack) 04/15/2012   Occlusion and stenosis of carotid artery without mention of cerebral infarction 08/18/2011   Bariatric surgery status 06/17/2011   Speech abnormality 03/22/2011   Dyspnea 02/24/2011   PAPILLARY MUSCLE DYSFUNCTION, NON-RHEUMATIC 10/09/2008   UNSPECIFIED VITAMIN D DEFICIENCY 10/24/2007   MYOCARDIAL INFARCTION, HX OF 10/24/2007   PERSISTENT VOMITING 10/24/2007   OSTEOARTHRITIS 10/24/2007   MIGRAINES, HX OF 10/24/2007   Diabetes mellitus type 2, controlled, with complications ([place]) 67/02/4101   Hyperlipemia 01/16/2007   Obesity-post failed open gastroplasty 1984  01/16/2007   OBSTRUCTIVE SLEEP APNEA 01/16/2007   Essential hypertension 01/16/2007   Coronary atherosclerosis 01/16/2007   Asthma 01/16/2007   GERD 01/16/2007   VENTRAL HERNIA 01/16/2007    Eliannah Hinde,ANGIE, PTA 10/23/2020, 11:33 AM  [address]. [place], [place], [address] Phone: [phone]   Fax:  [phone]  Name: KALLYN DEMARCUS [name]: [id number] Date of Birth: [birth year]

## 2020-10-28 ENCOUNTER — Ambulatory Visit: Payer: Medicare Other | Admitting: Physical Therapy

## 2020-10-28 ENCOUNTER — Other Ambulatory Visit: Payer: Self-pay

## 2020-10-28 DIAGNOSIS — R2681 Unsteadiness on feet: Secondary | ICD-10-CM

## 2020-10-28 DIAGNOSIS — R262 Difficulty in walking, not elsewhere classified: Secondary | ICD-10-CM

## 2020-10-28 DIAGNOSIS — M6281 Muscle weakness (generalized): Secondary | ICD-10-CM | POA: Diagnosis not present

## 2020-10-28 NOTE — Therapy (Signed)
[facility] [place], [place], [address] Phone: [phone]   Fax:  [phone]  Physical Therapy Treatment  Patient Details  Name: [name] [name] [name]: [id number] Date of Birth: 22-Jul-1945 Referring Provider (PT): Dr Loralie Champagne   Encounter Date: 10/28/2020   PT End of Session - 10/28/20 1431     Visit Number 8    Date for PT Re-Evaluation 12/19/20    PT Start Time 1345    PT Stop Time 1430    PT Time Calculation (min) 45 min             Past Medical History:  Diagnosis Date   Anemia    hx   Anxiety    Asthma    Basal cell carcinoma 05/2014   "left shoulder"   Bundle branch block, left    chronic/notes 07/18/2013   CHF (congestive heart failure) ([place])    Chronic insomnia 05/06/2015   Chronic kidney disease    frequency, sees dr Jamal Maes every 4 to 6 months (01/16/2018)   Chronic lower back pain    Claustrophobia    Common migraine 05/14/2014   Coronary artery disease    MI in 2001, 2002, 2006, 2011, 2014   Depression    Diabetic peripheral neuropathy ([place]) 01/12/2019   GERD (gastroesophageal reflux disease)    H/O hiatal hernia    Headache    "at least 2/month" (01/16/2018)   Heart murmur    Hyperlipidemia    Hypertension    Memory change 05/14/2014   Migraine    "5-6/year"  (01/16/2018)   Obesity 01-2010   Obstructive sleep apnea    "can't wear machine; I have claustrophobia" (01/16/2018), states she had a 2nd sleep study and does not have sleep apnea, her O2 decreases and now is on 2 L of O2 at night.   On home oxygen therapy    "2L at night and prn during daytime" (01/16/2018)   Osteoarthritis    "knees and hands" (01/16/2018)   Peripheral vascular disease ([place])    ? numbness, tingling arms and legs   PONV (postoperative nausea and vomiting)    Stroke ([place])    2014, 2015, 2016   Type II diabetes mellitus ([place])    Ventral hernia    hx of    Past Surgical History:   Procedure Laterality Date   ANKLE FRACTURE SURGERY Left 1970's   APPENDECTOMY  1970's   w/hysterectomy   BASAL CELL CARCINOMA EXCISION Left 05/2014   "shoulder" (01/16/2018)   CARDIAC CATHETERIZATION  10/10/2012   Dr [name].   CARDIAC CATHETERIZATION N/A 05/29/2015   Procedure: Right/Left Heart Cath and Coronary/Graft Angiography;  Surgeon: Larey Dresser, MD;  Location: [facility] LAB;  Service: Cardiovascular;  Laterality: N/A;   CAROTID ENDARTERECTOMY Left 03/2013   CAROTID STENT INSERTION Left 03/20/2013   Procedure: CAROTID STENT INSERTION;  Surgeon: [name], MD;  Location: [facility] CATH LAB;  Service: Cardiovascular;  Laterality: Left;  internal carotid   CEREBRAL ANGIOGRAM N/A 04/05/2011   Procedure: CEREBRAL ANGIOGRAM;  Surgeon: Angelia Mould, MD;  Location: [facility] CATH LAB;  Service: Cardiovascular;  Laterality: N/A;   CHOLECYSTECTOMY OPEN  2004   CORONARY ANGIOPLASTY WITH STENT PLACEMENT  01,02,05,06,07,08,11; 04/24/2013   "I've probably got ~ 10 stents by now" (04/24/2013)   CORONARY ANGIOPLASTY WITH STENT PLACEMENT  06/13/2013   "got 4 stents today" (06/13/2013)   CORONARY ARTERY BYPASS GRAFT  1220/11   "CABG  X5"   CORONARY STENT INTERVENTION N/A 10/20/2017   Procedure: CORONARY STENT INTERVENTION;  Surgeon: Troy Sine, MD;  Location: [place] CV LAB;  Service: Cardiovascular;  Laterality: N/A;   ESOPHAGOGASTRODUODENOSCOPY  08/03/2011   Procedure: ESOPHAGOGASTRODUODENOSCOPY (EGD);  Surgeon: Shann Medal, MD;  Location[facility];  Service: General;  Laterality: N/A;   ESOPHAGOGASTRODUODENOSCOPY (EGD) WITH PROPOFOL N/A 03/11/2014   Procedure: ESOPHAGOGASTRODUODENOSCOPY (EGD) WITH PROPOFOL;  Surgeon: Lafayette Dragon, MD;  [facility]: [facility];  Service: Endoscopy;  Laterality: N/A;   FRACTURE SURGERY     gall stone removal  05/2003   GASTRIC RESTRICTION SURGERY  1984   "stapeling"   HERNIA REPAIR  2004   "in my stomach; had OR on it twice", wire mesh on 1 hernia    LEFT HEART CATH AND CORS/GRAFTS ANGIOGRAPHY N/A 07/09/2016   Procedure: LEFT HEART CATH AND CORS/GRAFTS ANGIOGRAPHY;  Surgeon: Larey Dresser, MD;  Location: [place] CV LAB;  Service: Cardiovascular;  Laterality: N/A;   ORIF ANKLE FRACTURE Left 05/16/2018   ORIF ANKLE FRACTURE Left 05/16/2018   Procedure: OPEN REDUCTION INTERNAL FIXATION (ORIF) Left ankle with possible syndesmosis fixation;  Surgeon: Nicholes Stairs, MD;  Location: [facility];  Service: Orthopedics;  Laterality: Left;  188mn   OVARY SURGERY  1970's   "tumor removed"   PERCUTANEOUS CORONARY STENT INTERVENTION (PCI-S) N/A 06/13/2013   Procedure: PERCUTANEOUS CORONARY STENT INTERVENTION (PCI-S);  Surgeon: JJettie Booze MD;  [facility];  Service: Cardiovascular;  Laterality: N/A;   PERCUTANEOUS STENT INTERVENTION N/A 04/24/2013   Procedure: PERCUTANEOUS STENT INTERVENTION;  Surgeon: JJettie Booze MD;  [facility];  Service: Cardiovascular;  Laterality: N/A;   RIGHT/LEFT HEART CATH AND CORONARY/GRAFT ANGIOGRAPHY N/A 10/20/2017   Procedure: RIGHT/LEFT HEART CATH AND CORONARY/GRAFT ANGIOGRAPHY;  Surgeon: MLarey Dresser MD;  [facility];  Service: Cardiovascular;  Laterality: N/A;   ROOT CANAL  10/2000   TEE WITHOUT CARDIOVERSION N/A 01/18/2019   Procedure: TRANSESOPHAGEAL ECHOCARDIOGRAM (TEE);  Surgeon: MLarey Dresser MD;  [facility];  Service: Cardiovascular;  Laterality: N/A;   TIBIA FRACTURE SURGERY Right 1970's   rods and pins   TOOTH EXTRACTION     "1 on the upper; wisdom tooth on the lower" (01/16/2018)   TOTAL ABDOMINAL HYSTERECTOMY  1970's   w/ appendectomy    There were no vitals filed for this visit.   Subjective Assessment - 10/28/20 1350     Subjective took a fall in bathroom yesterday, left knee gave away-fell backwards, hit back and shlds    Currently in Pain? Yes    Pain Score 6     Pain Location Back                                [facility] Adult PT Treatment/Exercise - 10/28/20 0001       Lumbar Exercises: Seated   Sit to Stand 5 reps   from standard chair without UE   Other Seated Lumbar Exercises iso core work   plyo ball,wt ball and tband     Knee/Hip Exercises: Aerobic   Nustep L 5 8 min      Knee/Hip Exercises: Seated   Long Arc Quad Strengthening;Both;2 sets;15 reps;Weights    Long Arc Quad Weight 3 lbs.    Ball Squeeze 20x    Clamshell with TheraBand Blue   20   Marching Strengthening;Both;2 sets;15 reps  Marching Weights 3 lbs.    Hamstring Curl Strengthening;Both;2 sets;10 reps   green tband   Abduction/Adduction  Strengthening;Both;2 sets;15 reps    Abd/Adduction Weights 3 lbs.                       PT Short Term Goals - 10/07/20 0909       PT SHORT TERM GOAL #1   Title Patient to be independent with initial HEP.    Status Achieved               PT Long Term Goals - 10/21/20 1131       PT LONG TERM GOAL #1   Title Patient to be independent with advanced HEP.    Status Partially Met      PT LONG TERM GOAL #2   Title Patient to demonstrate B LE strength >/=4+/5.    Status Partially Met      PT LONG TERM GOAL #3   Title Patient to score <14 sec on TUG testing in order to decrease risk of falls.    Baseline 14.13  sec with RW    Status Partially Met      PT LONG TERM GOAL #4   Title Patient to score <13 sec on 5xSTS in order to decrease risk of falls.    Status Partially Met      PT LONG TERM GOAL #5   Title Patient to report tolerance for 15 min of walking with LRAD without fatigue limiting.    Status On-going                   Plan - 10/28/20 1432     Clinical Impression Statement adjusted tx to more sitting ex secondary to fall yesterday. some increased catching with ex in left knee. added MH to LB with seated ex.    PT Treatment/Interventions ADLs/Self Care Home Management;Cryotherapy;Electrical  Stimulation;Iontophoresis 68m/ml Dexamethasone;Moist Heat;Gait training;Stair training;Functional mobility training;Therapeutic activities;Therapeutic exercise;Balance training;Neuromuscular re-education;Patient/family education;Manual techniques;Passive range of motion;Dry needling;Taping;Vasopneumatic Device;Joint Manipulations;Spinal Manipulations    PT Next Visit Plan assess and progress strength ( hips and core ) esp in standing             Patient will benefit from skilled therapeutic intervention in order to improve the following deficits and impairments:  Decreased balance, Difficulty walking, Decreased activity tolerance, Decreased strength, Pain  Visit Diagnosis: Muscle weakness (generalized)  Difficulty in walking, not elsewhere classified  Unsteadiness on feet     Problem List Patient Active Problem List   Diagnosis Date Noted   Symptomatic bradycardia 01/16/2019   Normocytic anemia 01/16/2019   Diabetic peripheral neuropathy ([place] 01/12/2019   Neurologic abnormality 12/25/2018   Debility 09/21/2018   Hypotension 09/18/2018   Bradycardia 09/18/2018   AKI (acute kidney injury) ([place] 09/18/2018   Pressure injury of skin 09/18/2018   Ischemic cerebrovascular accident (CVA) of frontal lobe ([place] 09/13/2018   Fall 09/09/2018   History of CVA (cerebrovascular accident) 09/09/2018   Type II diabetes mellitus with renal manifestations ([place] 09/09/2018   CKD (chronic kidney disease), stage III 09/09/2018   Sepsis ([place] 09/09/2018   Depression with anxiety 09/09/2018   Slurred speech 09/09/2018   Ankle fracture, left 05/16/2018   Amiodarone pulmonary toxicity    Physical deconditioning    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    Acute on chronic congestive heart failure ([place]    Chronic combined systolic (congestive) and diastolic (congestive) heart failure ([place] 01/16/2018   Chronic  systolic CHF (congestive heart failure) ([place]) 01/16/2018   Atrial fibrillation ([place]) 10/21/2017    CAD (coronary artery disease) of bypass graft 10/20/2017   Coronary artery disease of bypass graft of native heart with stable angina pectoris (HCC)    Chronic low back pain 08/24/2016   Pain of left thumb 08/05/2016   Nocturnal hypoxia 06/02/2016   Hoarseness 04/05/2016   Laryngopharyngeal reflux (LPR) 04/05/2016   Pharyngoesophageal dysphagia 04/05/2016   Angina decubitus ([place]) 05/22/2015   Chronic insomnia 05/06/2015   Common migraine 05/14/2014   Abnormality of gait 05/14/2014   Memory change 05/14/2014   Aneurysm, cerebral, nonruptured 05/14/2014   Cramp of limb-Left neck 05/10/2014   Hematemesis with nausea    Vomiting blood    Dizziness and giddiness 09/21/2013   Atypical chest pain 07/18/2013   Unstable angina ([place]) 04/09/2013   Carotid artery stenosis, symptomatic 03/20/2013   Cerebrovascular disease 07/10/2012   Cerebral artery occlusion with cerebral infarction ([place]) 07/10/2012   Mitral regurgitation 04/15/2012   TIA (transient ischemic attack) 04/15/2012   Occlusion and stenosis of carotid artery without mention of cerebral infarction 08/18/2011   Bariatric surgery status 06/17/2011   Speech abnormality 03/22/2011   Dyspnea 02/24/2011   PAPILLARY MUSCLE DYSFUNCTION, NON-RHEUMATIC 10/09/2008   UNSPECIFIED VITAMIN D DEFICIENCY 10/24/2007   MYOCARDIAL INFARCTION, HX OF 10/24/2007   PERSISTENT VOMITING 10/24/2007   OSTEOARTHRITIS 10/24/2007   MIGRAINES, HX OF 10/24/2007   Diabetes mellitus type 2, controlled, with complications ([place]) 95/63/8756   Hyperlipemia 01/16/2007   Obesity-post failed open gastroplasty 1984  01/16/2007   OBSTRUCTIVE SLEEP APNEA 01/16/2007   Essential hypertension 01/16/2007   Coronary atherosclerosis 01/16/2007   Asthma 01/16/2007   GERD 01/16/2007   VENTRAL HERNIA 01/16/2007    Kadeshia Kasparian,ANGIE, PTA 10/28/2020, 2:37 PM  [place]. [place], [place], [address] Phone:  [phone]   Fax:  [phone]  Name: [name] [name] [name]: [id number] Date of Birth: Jul 02, 1945

## 2020-10-30 ENCOUNTER — Other Ambulatory Visit (HOSPITAL_BASED_OUTPATIENT_CLINIC_OR_DEPARTMENT_OTHER): Payer: Self-pay | Admitting: Family Medicine

## 2020-10-30 ENCOUNTER — Telehealth (HOSPITAL_BASED_OUTPATIENT_CLINIC_OR_DEPARTMENT_OTHER): Payer: Self-pay

## 2020-10-30 ENCOUNTER — Ambulatory Visit: Payer: Medicare Other | Admitting: Physical Therapy

## 2020-10-30 DIAGNOSIS — Z1231 Encounter for screening mammogram for malignant neoplasm of breast: Secondary | ICD-10-CM

## 2020-11-04 ENCOUNTER — Ambulatory Visit: Payer: Medicare Other | Attending: Cardiology | Admitting: Physical Therapy

## 2020-11-04 ENCOUNTER — Other Ambulatory Visit: Payer: Self-pay

## 2020-11-04 DIAGNOSIS — M6281 Muscle weakness (generalized): Secondary | ICD-10-CM

## 2020-11-04 DIAGNOSIS — R262 Difficulty in walking, not elsewhere classified: Secondary | ICD-10-CM

## 2020-11-04 DIAGNOSIS — R2689 Other abnormalities of gait and mobility: Secondary | ICD-10-CM | POA: Diagnosis present

## 2020-11-04 DIAGNOSIS — R2681 Unsteadiness on feet: Secondary | ICD-10-CM

## 2020-11-04 DIAGNOSIS — R5381 Other malaise: Secondary | ICD-10-CM | POA: Diagnosis present

## 2020-11-04 NOTE — Therapy (Signed)
[facility]. [place], [place], [address] Phone: [phone]   Fax:  [phone]  Physical Therapy Treatment  Patient Details  Name: [name] [name] [name]: [id number] Date of Birth: 1945/02/06 Referring Provider (PT): Dr Loralie Champagne   Encounter Date: 11/04/2020   PT End of Session - 11/04/20 1129     Visit Number 9    Date for PT Re-Evaluation 12/19/20    PT Start Time 1055    PT Stop Time 2993    PT Time Calculation (min) 50 min             Past Medical History:  Diagnosis Date   Anemia    hx   Anxiety    Asthma    Basal cell carcinoma 05/2014   "left shoulder"   Bundle branch block, left    chronic/notes 07/18/2013   CHF (congestive heart failure) ([place])    Chronic insomnia 05/06/2015   Chronic kidney disease    frequency, sees dr Jamal Maes every 4 to 6 months (01/16/2018)   Chronic lower back pain    Claustrophobia    Common migraine 05/14/2014   Coronary artery disease    MI in 2001, 2002, 2006, 2011, 2014   Depression    Diabetic peripheral neuropathy ([place]) 01/12/2019   GERD (gastroesophageal reflux disease)    H/O hiatal hernia    Headache    "at least 2/month" (01/16/2018)   Heart murmur    Hyperlipidemia    Hypertension    Memory change 05/14/2014   Migraine    "5-6/year"  (01/16/2018)   Obesity 01-2010   Obstructive sleep apnea    "can't wear machine; I have claustrophobia" (01/16/2018), states she had a 2nd sleep study and does not have sleep apnea, her O2 decreases and now is on 2 L of O2 at night.   On home oxygen therapy    "2L at night and prn during daytime" (01/16/2018)   Osteoarthritis    "knees and hands" (01/16/2018)   Peripheral vascular disease ([place])    ? numbness, tingling arms and legs   PONV (postoperative nausea and vomiting)    Stroke ([place])    2014, 2015, 2016   Type II diabetes mellitus ([place])    Ventral hernia    hx of    Past Surgical History:   Procedure Laterality Date   ANKLE FRACTURE SURGERY Left 1970's   APPENDECTOMY  1970's   w/hysterectomy   BASAL CELL CARCINOMA EXCISION Left 05/2014   "shoulder" (01/16/2018)   CARDIAC CATHETERIZATION  10/10/2012   Dr [name].   CARDIAC CATHETERIZATION N/A 05/29/2015   Procedure: Right/Left Heart Cath and Coronary/Graft Angiography;  Surgeon: Larey Dresser, MD;  Location: [facility] LAB;  Service: Cardiovascular;  Laterality: N/A;   CAROTID ENDARTERECTOMY Left 03/2013   CAROTID STENT INSERTION Left 03/20/2013   Procedure: CAROTID STENT INSERTION;  Surgeon: [name], MD;  [facility];  Service: Cardiovascular;  Laterality: Left;  internal carotid   CEREBRAL ANGIOGRAM N/A 04/05/2011   Procedure: CEREBRAL ANGIOGRAM;  Surgeon: Angelia Mould, MD;  Location: [facility] CATH LAB;  Service: Cardiovascular;  Laterality: N/A;   CHOLECYSTECTOMY OPEN  2004   CORONARY ANGIOPLASTY WITH STENT PLACEMENT  01,02,05,06,07,08,11; 04/24/2013   "I've probably got ~ 10 stents by now" (04/24/2013)   CORONARY ANGIOPLASTY WITH STENT PLACEMENT  06/13/2013   "got 4 stents today" (06/13/2013)   CORONARY ARTERY BYPASS GRAFT  1220/11   "CABG  X5"   CORONARY STENT INTERVENTION N/A 10/20/2017   Procedure: CORONARY STENT INTERVENTION;  Surgeon: Troy Sine, MD;  Location: [place] [facility] CV LAB;  Service: Cardiovascular;  Laterality: N/A;   ESOPHAGOGASTRODUODENOSCOPY  08/03/2011   Procedure: ESOPHAGOGASTRODUODENOSCOPY (EGD);  Surgeon: Shann Medal, MD;  [facility];  Service: General;  Laterality: N/A;   ESOPHAGOGASTRODUODENOSCOPY (EGD) WITH PROPOFOL N/A 03/11/2014   Procedure: ESOPHAGOGASTRODUODENOSCOPY (EGD) WITH PROPOFOL;  Surgeon: Lafayette Dragon, MD;  [facility];  Service: Endoscopy;  Laterality: N/A;   FRACTURE SURGERY     gall stone removal  05/2003   GASTRIC RESTRICTION SURGERY  1984   "stapeling"   HERNIA REPAIR  2004   "in my stomach; had OR on it twice", wire mesh on 1 hernia    LEFT HEART CATH AND CORS/GRAFTS ANGIOGRAPHY N/A 07/09/2016   Procedure: LEFT HEART CATH AND CORS/GRAFTS ANGIOGRAPHY;  Surgeon: Larey Dresser, MD;  Location: [facility] LAB;  Service: Cardiovascular;  Laterality: N/A;   ORIF ANKLE FRACTURE Left 05/16/2018   ORIF ANKLE FRACTURE Left 05/16/2018   Procedure: OPEN REDUCTION INTERNAL FIXATION (ORIF) Left ankle with possible syndesmosis fixation;  Surgeon: Nicholes Stairs, MD;  Location: [facility];  Service: Orthopedics;  Laterality: Left;  120mn   OVARY SURGERY  1970's   "tumor removed"   PERCUTANEOUS CORONARY STENT INTERVENTION (PCI-S) N/A 06/13/2013   Procedure: PERCUTANEOUS CORONARY STENT INTERVENTION (PCI-S);  Surgeon: JJettie Booze MD;  [facility];  Service: Cardiovascular;  Laterality: N/A;   PERCUTANEOUS STENT INTERVENTION N/A 04/24/2013   Procedure: PERCUTANEOUS STENT INTERVENTION;  Surgeon: JJettie Booze MD;  [facility];  Service: Cardiovascular;  Laterality: N/A;   RIGHT/LEFT HEART CATH AND CORONARY/GRAFT ANGIOGRAPHY N/A 10/20/2017   Procedure: RIGHT/LEFT HEART CATH AND CORONARY/GRAFT ANGIOGRAPHY;  Surgeon: MLarey Dresser MD;  [facility];  Service: Cardiovascular;  Laterality: N/A;   ROOT CANAL  10/2000   TEE WITHOUT CARDIOVERSION N/A 01/18/2019   Procedure: TRANSESOPHAGEAL ECHOCARDIOGRAM (TEE);  Surgeon: MLarey Dresser MD;  [facility];  Service: Cardiovascular;  Laterality: N/A;   TIBIA FRACTURE SURGERY Right 1970's   rods and pins   TOOTH EXTRACTION     "1 on the upper; wisdom tooth on the lower" (01/16/2018)   TOTAL ABDOMINAL HYSTERECTOMY  1970's   w/ appendectomy    There were no vitals filed for this visit.   Subjective Assessment - 11/04/20 1104     Subjective sorry i canceled last time, I had food poisening. pt reports another fall in closet when left knee gave way. normal aches and pains    Currently in Pain? Yes                                [facility] Adult PT Treatment/Exercise - 11/04/20 0001       Lumbar Exercises: Aerobic   UBE (Upper Arm Bike) L 2 3 min fwd/3 min back      Lumbar Exercises: Seated   Other Seated Lumbar Exercises trunk flex and ext 20 x with black tbnad      Knee/Hip Exercises: Aerobic   Nustep L 5 7 min      Knee/Hip Exercises: Seated   Long Arc Quad Strengthening;Both;2 sets;10 reps;Weights   3   Long Arc Quad Weight 3 lbs.    Ball Squeeze 20x    Clamshell with TheraBand Blue   20x  Marching Strengthening;Both;20 reps   blue tband   Hamstring Curl Strengthening;Both;2 sets;10 reps   green tband                      PT Short Term Goals - 10/07/20 0909       PT SHORT TERM GOAL #1   Title Patient to be independent with initial HEP.    Status Achieved               PT Long Term Goals - 11/04/20 1128       PT LONG TERM GOAL #1   Title Patient to be independent with advanced HEP.    Status Partially Met      PT LONG TERM GOAL #2   Title Patient to demonstrate B LE strength >/=4+/5.    Status Partially Met      PT LONG TERM GOAL #3   Title Patient to score <14 sec on TUG testing in order to decrease risk of falls.    Baseline 15 sec with RW    Status Partially Met                   Plan - 11/04/20 1129     Clinical Impression Statement TUG slightly longer than last week. LE weakness hips and knees Left knee greater than RT . Left knee PTA knees to block with standing ex on RT as it wants to buckle - this has caused last 2 falls in 10-14 days    PT Treatment/Interventions ADLs/Self Care Home Management;Cryotherapy;Electrical Stimulation;Iontophoresis 30m/ml Dexamethasone;Moist Heat;Gait training;Stair training;Functional mobility training;Therapeutic activities;Therapeutic exercise;Balance training;Neuromuscular re-education;Patient/family education;Manual techniques;Passive range of motion;Dry needling;Taping;Vasopneumatic  Device;Joint Manipulations;Spinal Manipulations    PT Next Visit Plan progress strength ( hips and core ) esp in standing. 10th visit             Patient will benefit from skilled therapeutic intervention in order to improve the following deficits and impairments:  Decreased balance, Difficulty walking, Decreased activity tolerance, Decreased strength, Pain  Visit Diagnosis: Muscle weakness (generalized)  Difficulty in walking, not elsewhere classified  Unsteadiness on feet     Problem List Patient Active Problem List   Diagnosis Date Noted   Symptomatic bradycardia 01/16/2019   Normocytic anemia 01/16/2019   Diabetic peripheral neuropathy ([place] 01/12/2019   Neurologic abnormality 12/25/2018   Debility 09/21/2018   Hypotension 09/18/2018   Bradycardia 09/18/2018   AKI (acute kidney injury) (HT[place] 09/18/2018   Pressure injury of skin 09/18/2018   Ischemic cerebrovascular accident (CVA) of frontal lobe ([place] 09/13/2018   Fall 09/09/2018   History of CVA (cerebrovascular accident) 09/09/2018   Type II diabetes mellitus with renal manifestations (HA[place] 09/09/2018   CKD (chronic kidney disease), stage III 09/09/2018   Sepsis (H[place] 09/09/2018   Depression with anxiety 09/09/2018   Slurred speech 09/09/2018   Ankle fracture, left 05/16/2018   Amiodarone pulmonary toxicity    Physical deconditioning    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HAr[place]    Acute on chronic congestive heart failure (HCC)    Chronic combined systolic (congestive) and diastolic (congestive) heart failure (HGrand View Estates 136/64/4034  Chronic systolic CHF (congestive heart failure) (HE[place] 01/16/2018   Atrial fibrillation (HC[place] 10/21/2017   CAD (coronary artery disease) of bypass graft 10/20/2017   Coronary artery disease of bypass graft of native heart with stable angina pectoris (HFarnhamville    Chronic low back pain 08/24/2016   Pain of left thumb 08/05/2016   Nocturnal hypoxia 06/02/2016  Hoarseness 04/05/2016    Laryngopharyngeal reflux (LPR) 04/05/2016   Pharyngoesophageal dysphagia 04/05/2016   Angina decubitus ([place]) 05/22/2015   Chronic insomnia 05/06/2015   Common migraine 05/14/2014   Abnormality of gait 05/14/2014   Memory change 05/14/2014   Aneurysm, cerebral, nonruptured 05/14/2014   Cramp of limb-Left neck 05/10/2014   Hematemesis with nausea    Vomiting blood    Dizziness and giddiness 09/21/2013   Atypical chest pain 07/18/2013   Unstable angina ([place]) 04/09/2013   Carotid artery stenosis, symptomatic 03/20/2013   Cerebrovascular disease 07/10/2012   Cerebral artery occlusion with cerebral infarction ([place]) 07/10/2012   Mitral regurgitation 04/15/2012   TIA (transient ischemic attack) 04/15/2012   Occlusion and stenosis of carotid artery without mention of cerebral infarction 08/18/2011   Bariatric surgery status 06/17/2011   Speech abnormality 03/22/2011   Dyspnea 02/24/2011   PAPILLARY MUSCLE DYSFUNCTION, NON-RHEUMATIC 10/09/2008   UNSPECIFIED VITAMIN D DEFICIENCY 10/24/2007   MYOCARDIAL INFARCTION, HX OF 10/24/2007   PERSISTENT VOMITING 10/24/2007   OSTEOARTHRITIS 10/24/2007   MIGRAINES, HX OF 10/24/2007   Diabetes mellitus type 2, controlled, with complications ([place]) 57/90/3833   Hyperlipemia 01/16/2007   Obesity-post failed open gastroplasty 1984  01/16/2007   OBSTRUCTIVE SLEEP APNEA 01/16/2007   Essential hypertension 01/16/2007   Coronary atherosclerosis 01/16/2007   Asthma 01/16/2007   GERD 01/16/2007   VENTRAL HERNIA 01/16/2007    Farin Buhman,ANGIE, PTA 11/04/2020, 11:31 AM  [place]. [place], [place], [address] Phone: [phone]   Fax:  [phone]  Name: [name] [name] [name]: [id number] Date of Birth: 06-14-1945

## 2020-11-06 ENCOUNTER — Encounter: Payer: Self-pay | Admitting: Physical Therapy

## 2020-11-06 ENCOUNTER — Ambulatory Visit: Payer: Medicare Other | Admitting: Physical Therapy

## 2020-11-06 ENCOUNTER — Other Ambulatory Visit: Payer: Self-pay

## 2020-11-06 DIAGNOSIS — R262 Difficulty in walking, not elsewhere classified: Secondary | ICD-10-CM

## 2020-11-06 DIAGNOSIS — R2689 Other abnormalities of gait and mobility: Secondary | ICD-10-CM

## 2020-11-06 DIAGNOSIS — M6281 Muscle weakness (generalized): Secondary | ICD-10-CM | POA: Diagnosis not present

## 2020-11-06 DIAGNOSIS — R2681 Unsteadiness on feet: Secondary | ICD-10-CM

## 2020-11-06 NOTE — Therapy (Signed)
[place]. [place], [place], [address] Phone: [phone]   Fax:  [phone]  Physical Therapy Progress Note  Patient Details  Name: [name] [name] [name]: [id number] Date of Birth: Oct 21, 1945 Referring Provider (PT): Dr Loralie Champagne  Progress Note Reporting Period 09/29/20 to 11/06/20  See note below for Objective Data and Assessment of Progress/Goals.    Encounter Date: 11/06/2020   PT End of Session - 11/06/20 1147     Visit Number 10    Date for PT Re-Evaluation 12/19/20    PT Start Time 1102    PT Stop Time 1145    PT Time Calculation (min) 43 min    Activity Tolerance Patient tolerated treatment well;Patient limited by fatigue    Behavior During Therapy [facility] for tasks assessed/performed             Past Medical History:  Diagnosis Date   Anemia    hx   Anxiety    Asthma    Basal cell carcinoma 05/2014   "left shoulder"   Bundle branch block, left    chronic/notes 07/18/2013   CHF (congestive heart failure) ([place])    Chronic insomnia 05/06/2015   Chronic kidney disease    frequency, sees dr Jamal Maes every 4 to 6 months (01/16/2018)   Chronic lower back pain    Claustrophobia    Common migraine 05/14/2014   Coronary artery disease    MI in 2001, 2002, 2006, 2011, 2014   Depression    Diabetic peripheral neuropathy ([place]) 01/12/2019   GERD (gastroesophageal reflux disease)    H/O hiatal hernia    Headache    "at least 2/month" (01/16/2018)   Heart murmur    Hyperlipidemia    Hypertension    Memory change 05/14/2014   Migraine    "5-6/year"  (01/16/2018)   Obesity 01-2010   Obstructive sleep apnea    "can't wear machine; I have claustrophobia" (01/16/2018), states she had a 2nd sleep study and does not have sleep apnea, her O2 decreases and now is on 2 L of O2 at night.   On home oxygen therapy    "2L at night and prn during daytime" (01/16/2018)   Osteoarthritis    "knees and  hands" (01/16/2018)   Peripheral vascular disease ([place])    ? numbness, tingling arms and legs   PONV (postoperative nausea and vomiting)    Stroke ([place])    2014, 2015, 2016   Type II diabetes mellitus ([place])    Ventral hernia    hx of    Past Surgical History:  Procedure Laterality Date   ANKLE FRACTURE SURGERY Left 1970's   APPENDECTOMY  1970's   w/hysterectomy   BASAL CELL CARCINOMA EXCISION Left 05/2014   "shoulder" (01/16/2018)   CARDIAC CATHETERIZATION  10/10/2012   Dr [name].   CARDIAC CATHETERIZATION N/A 05/29/2015   Procedure: Right/Left Heart Cath and Coronary/Graft Angiography;  Surgeon: Larey Dresser, MD;  Location: [place] CV LAB;  Service: Cardiovascular;  Laterality: N/A;   CAROTID ENDARTERECTOMY Left 03/2013   CAROTID STENT INSERTION Left 03/20/2013   Procedure: CAROTID STENT INSERTION;  Surgeon: [name], MD;  [facility] CATH LAB;  Service: Cardiovascular;  Laterality: Left;  internal carotid   CEREBRAL ANGIOGRAM N/A 04/05/2011   Procedure: CEREBRAL ANGIOGRAM;  Surgeon: Angelia Mould, MD;  Location: [facility] CATH LAB;  Service: Cardiovascular;  Laterality: N/A;   CHOLECYSTECTOMY OPEN  2004   CORONARY ANGIOPLASTY WITH  STENT PLACEMENT  01,02,05,06,07,08,11; 04/24/2013   "I've probably got ~ 10 stents by now" (04/24/2013)   CORONARY ANGIOPLASTY WITH STENT PLACEMENT  06/13/2013   "got 4 stents today" (06/13/2013)   CORONARY ARTERY BYPASS GRAFT  1220/11   "CABG X5"   CORONARY STENT INTERVENTION N/A 10/20/2017   Procedure: CORONARY STENT INTERVENTION;  Surgeon: Troy Sine, MD;  Location: [place] CV LAB;  Service: Cardiovascular;  Laterality: N/A;   ESOPHAGOGASTRODUODENOSCOPY  08/03/2011   Procedure: ESOPHAGOGASTRODUODENOSCOPY (EGD);  Surgeon: Shann Medal, MD;  Location: [facility];  Service: General;  Laterality: N/A;   ESOPHAGOGASTRODUODENOSCOPY (EGD) WITH PROPOFOL N/A 03/11/2014   Procedure: ESOPHAGOGASTRODUODENOSCOPY (EGD) WITH PROPOFOL;  Surgeon:  Lafayette Dragon, MD;  Location: [facility] ENDOSCOPY;  Service: Endoscopy;  Laterality: N/A;   FRACTURE SURGERY     gall stone removal  05/2003   GASTRIC RESTRICTION SURGERY  1984   "stapeling"   HERNIA REPAIR  2004   "in my stomach; had OR on it twice", wire mesh on 1 hernia   LEFT HEART CATH AND CORS/GRAFTS ANGIOGRAPHY N/A 07/09/2016   Procedure: LEFT HEART CATH AND CORS/GRAFTS ANGIOGRAPHY;  Surgeon: Larey Dresser, MD;  Location: [place] CV LAB;  Service: Cardiovascular;  Laterality: N/A;   ORIF ANKLE FRACTURE Left 05/16/2018   ORIF ANKLE FRACTURE Left 05/16/2018   Procedure: OPEN REDUCTION INTERNAL FIXATION (ORIF) Left ankle with possible syndesmosis fixation;  Surgeon: Nicholes Stairs, MD;  Location: [place];  Service: Orthopedics;  Laterality: Left;  161mn   OVARY SURGERY  1970's   "tumor removed"   PERCUTANEOUS CORONARY STENT INTERVENTION (PCI-S) N/A 06/13/2013   Procedure: PERCUTANEOUS CORONARY STENT INTERVENTION (PCI-S);  Surgeon: JJettie Booze MD;  [facility];  Service: Cardiovascular;  Laterality: N/A;   PERCUTANEOUS STENT INTERVENTION N/A 04/24/2013   Procedure: PERCUTANEOUS STENT INTERVENTION;  Surgeon: JJettie Booze MD;  [facility];  Service: Cardiovascular;  Laterality: N/A;   RIGHT/LEFT HEART CATH AND CORONARY/GRAFT ANGIOGRAPHY N/A 10/20/2017   Procedure: RIGHT/LEFT HEART CATH AND CORONARY/GRAFT ANGIOGRAPHY;  Surgeon: MLarey Dresser MD;  [facility];  Service: Cardiovascular;  Laterality: N/A;   ROOT CANAL  10/2000   TEE WITHOUT CARDIOVERSION N/A 01/18/2019   Procedure: TRANSESOPHAGEAL ECHOCARDIOGRAM (TEE);  Surgeon: MLarey Dresser MD;  [facility];  Service: Cardiovascular;  Laterality: N/A;   TIBIA FRACTURE SURGERY Right 1970's   rods and pins   TOOTH EXTRACTION     "1 on the upper; wisdom tooth on the lower" (01/16/2018)   TOTAL ABDOMINAL HYSTERECTOMY  1970's   w/ appendectomy    There were no vitals  filed for this visit.   Subjective Assessment - 11/06/20 1103     Subjective Has not fallen since last session. Feels like her falls are d/t the L knee buckling. Feels scared to get the injection in her back. Reports improved UE strength and improved tolerance for using 4WW. Also reports improved postural awareness but notes remaining "wobbliness" in LE and imbalance.    Pertinent History CHF, Pneumonia    Patient Stated Goals I want to be able to stand up without falling over.    Currently in Pain? Yes    Pain Score 4     Pain Location Back    Pain Orientation Lower    Pain Descriptors / Indicators Aching;Jabbing    Pain Type Chronic pain                [facility]  PT Assessment - 11/06/20 0001       Assessment   Medical Diagnosis Falls, CHF    Referring Provider (PT) Dr Loralie Champagne    Onset Date/Surgical Date --   chronic     Strength   Strength Assessment Site Hip;Knee;Ankle    Right/Left Hip Right;Left    Right Hip Flexion 4/5    Right Hip ABduction 4+/5    Right Hip ADduction 4-/5    Left Hip Flexion 4/5    Left Hip ABduction 4+/5    Left Hip ADduction 4/5    Right/Left Knee Right;Left    Right Knee Flexion 4+/5    Right Knee Extension 4+/5    Left Knee Flexion 4+/5    Left Knee Extension 4/5    Right/Left Ankle Left;Right    Right Ankle Dorsiflexion 4/5    Right Ankle Plantar Flexion 4/5    Left Ankle Dorsiflexion 4-/5    Left Ankle Plantar Flexion 4-/5      Standardized Balance Assessment   Standardized Balance Assessment Five Times Sit to Stand    Five times sit to stand comments  23.30   with heavy B UE assist     Timed Up and Go Test   Normal TUG (seconds) 18.18   with 4WW                          [facility] Adult PT Treatment/Exercise - 11/06/20 0001       Neuro Re-ed    Neuro Re-ed Details  alt toe tap on cone with B 2 finger support in II bars 10x   PT holding cone still     Knee/Hip Exercises: Aerobic   Nustep L 5x6  min       Knee/Hip Exercises: Standing   Heel Raises Both;1 set;10 reps    Heel Raises Limitations in II bars    Hip Flexion Stengthening;Both;1 set;20 reps    Hip Flexion Limitations march in II bars    Hip Abduction Stengthening;Both;15 reps;Knee straight    Abduction Limitations in II bars                     PT Education - 11/06/20 1147     Education Details update to HEP- edu on safe set up at home    Person(s) Educated Patient    Methods Explanation;Demonstration;Tactile cues;Verbal cues;Handout    Comprehension Verbalized understanding;Returned demonstration              PT Short Term Goals - 11/06/20 1153       PT SHORT TERM GOAL #1   Title Patient to be independent with initial HEP.    Status Achieved               PT Long Term Goals - 11/06/20 1154       PT LONG TERM GOAL #1   Title Patient to be independent with advanced HEP.    Status Partially Met      PT LONG TERM GOAL #2   Title Patient to demonstrate B LE strength >/=4+/5.    Status Partially Met      PT LONG TERM GOAL #3   Title Patient to score <14 sec on TUG testing in order to decrease risk of falls.    Baseline 15 sec with RW    Status Partially Met   18 sec with 4WW     PT LONG TERM GOAL #4  Title Patient to score <13 sec on 5xSTS in order to decrease risk of falls.    Baseline 28.29 sec pushing off from B armrests    Period Weeks    Status Partially Met   23.30 sec with B UE use     PT LONG TERM GOAL #5   Title Patient to report tolerance for 15 min of walking with LRAD without fatigue limiting.    Baseline patient guarded today and needed to hold wall due to dizziness.    Status On-going   reports tolerance for 40-28f of walking before onset of LE "wobbliness"                  Plan - 11/06/20 1148     Clinical Impression Statement Patient arrived to session with report of some improvement in UE strength, postural awareness, and improved tolerance for using 4WW.  Still notes remaining "wobbliness" in LE and imbalance. Denies falls since last session. LE strength testing revealed marked improvement in L LE, however remaining weakness exists. TUG time has improved considerably, but still reveals increased risk of falls. 5xSTS also demonstrates excessive UE use and increased time to complete. Reports tolerance for walking 40-546fof walking with 4WW before she is limited by her legs and shoulders giving out and feeling weak. Worked on standing LE strengthening and balance exercises in parallel bars with focus on minial UE support d/t patient's tendency to use UEs excessively. Patient tolerated these exercises with intermittent sitting rest breaks and performed safely. Updated these exercises into HEP- patient reported understanding and without complaints at end of session. Patient is demonstrating slow but steady progress towards goals.    Comorbidities CHF, Pneumonia    PT Treatment/Interventions ADLs/Self Care Home Management;Cryotherapy;Electrical Stimulation;Iontophoresis 24m26ml Dexamethasone;Moist Heat;Gait training;Stair training;Functional mobility training;Therapeutic activities;Therapeutic exercise;Balance training;Neuromuscular re-education;Patient/family education;Manual techniques;Passive range of motion;Dry needling;Taping;Vasopneumatic Device;Joint Manipulations;Spinal Manipulations    PT Next Visit Plan standing LE strengthening and balance exercise with focus on decreasing UE use    Consulted and Agree with Plan of Care Patient             Patient will benefit from skilled therapeutic intervention in order to improve the following deficits and impairments:  Decreased balance, Difficulty walking, Decreased activity tolerance, Decreased strength, Pain  Visit Diagnosis: Muscle weakness (generalized)  Difficulty in walking, not elsewhere classified  Unsteadiness on feet  Other abnormalities of gait and mobility     Problem List Patient  Active Problem List   Diagnosis Date Noted   Symptomatic bradycardia 01/16/2019   Normocytic anemia 01/16/2019   Diabetic peripheral neuropathy (HCCCross Mountain2/12/2018   Neurologic abnormality 12/25/2018   Debility 09/21/2018   Hypotension 09/18/2018   Bradycardia 09/18/2018   AKI (acute kidney injury) (HCCRiverside8/17/2020   Pressure injury of skin 09/18/2018   Ischemic cerebrovascular accident (CVA) of frontal lobe (HCCSuwanee8/01/2019   Fall 09/09/2018   History of CVA (cerebrovascular accident) 09/09/2018   Type II diabetes mellitus with renal manifestations (HCCTaylor8/09/2018   CKD (chronic kidney disease), stage III 09/09/2018   Sepsis (HCCRio Grande8/09/2018   Depression with anxiety 09/09/2018   Slurred speech 09/09/2018   Ankle fracture, left 05/16/2018   Amiodarone pulmonary toxicity    Physical deconditioning    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HCCKankakee  Acute on chronic congestive heart failure (HCCInkom  Chronic combined systolic (congestive) and diastolic (congestive) heart failure (HCCThree Rivers2/63/14/9702Chronic systolic CHF (congestive heart failure) (HCCHalfway2/16/2019   Atrial fibrillation (  [place]) 10/21/2017   CAD (coronary artery disease) of bypass graft 10/20/2017   Coronary artery disease of bypass graft of native heart with stable angina pectoris (HCC)    Chronic low back pain 08/24/2016   Pain of left thumb 08/05/2016   Nocturnal hypoxia 06/02/2016   Hoarseness 04/05/2016   Laryngopharyngeal reflux (LPR) 04/05/2016   Pharyngoesophageal dysphagia 04/05/2016   Angina decubitus ([place]) 05/22/2015   Chronic insomnia 05/06/2015   Common migraine 05/14/2014   Abnormality of gait 05/14/2014   Memory change 05/14/2014   Aneurysm, cerebral, nonruptured 05/14/2014   Cramp of limb-Left neck 05/10/2014   Hematemesis with nausea    Vomiting blood    Dizziness and giddiness 09/21/2013   Atypical chest pain 07/18/2013   Unstable angina ([place]) 04/09/2013   Carotid artery stenosis, symptomatic 03/20/2013    Cerebrovascular disease 07/10/2012   Cerebral artery occlusion with cerebral infarction ([place]) 07/10/2012   Mitral regurgitation 04/15/2012   TIA (transient ischemic attack) 04/15/2012   Occlusion and stenosis of carotid artery without mention of cerebral infarction 08/18/2011   Bariatric surgery status 06/17/2011   Speech abnormality 03/22/2011   Dyspnea 02/24/2011   PAPILLARY MUSCLE DYSFUNCTION, NON-RHEUMATIC 10/09/2008   UNSPECIFIED VITAMIN D DEFICIENCY 10/24/2007   MYOCARDIAL INFARCTION, HX OF 10/24/2007   PERSISTENT VOMITING 10/24/2007   OSTEOARTHRITIS 10/24/2007   MIGRAINES, HX OF 10/24/2007   Diabetes mellitus type 2, controlled, with complications ([place]) 27/04/5007   Hyperlipemia 01/16/2007   Obesity-post failed open gastroplasty 1984  01/16/2007   OBSTRUCTIVE SLEEP APNEA 01/16/2007   Essential hypertension 01/16/2007   Coronary atherosclerosis 01/16/2007   Asthma 01/16/2007   GERD 01/16/2007   VENTRAL HERNIA 01/16/2007   Janene Harvey, PT, DPT 11/06/20 11:58 AM   [place]. [place], [place], [address] Phone: [phone]   Fax:  [phone]  Name: BERNARD DONAHOO [name]: [id number] Date of Birth: Mar 30, 1945

## 2020-11-11 ENCOUNTER — Ambulatory Visit: Payer: Medicare Other | Admitting: Physical Therapy

## 2020-11-11 ENCOUNTER — Other Ambulatory Visit: Payer: Self-pay

## 2020-11-11 DIAGNOSIS — R262 Difficulty in walking, not elsewhere classified: Secondary | ICD-10-CM

## 2020-11-11 DIAGNOSIS — M6281 Muscle weakness (generalized): Secondary | ICD-10-CM | POA: Diagnosis not present

## 2020-11-11 DIAGNOSIS — R2681 Unsteadiness on feet: Secondary | ICD-10-CM

## 2020-11-11 NOTE — Therapy (Signed)
[place]. [place], [place], [address] Phone: [phone]   Fax:  [phone]  Physical Therapy Treatment  Patient Details  Name: [name] [name] [name]: [id number] Date of Birth: [birth year] Referring Provider (PT): Dr Loralie Champagne   Encounter Date: 11/11/2020   PT End of Session - 11/11/20 1141     Visit Number 11    Date for PT Re-Evaluation 12/19/20    PT Start Time 1058    PT Stop Time 1657    PT Time Calculation (min) 47 min             Past Medical History:  Diagnosis Date   Anemia    hx   Anxiety    Asthma    Basal cell carcinoma 05/2014   "left shoulder"   Bundle branch block, left    chronic/notes 07/18/2013   CHF (congestive heart failure) ([place])    Chronic insomnia 05/06/2015   Chronic kidney disease    frequency, sees dr Jamal Maes every 4 to 6 months (01/16/2018)   Chronic lower back pain    Claustrophobia    Common migraine 05/14/2014   Coronary artery disease    MI in 2001, 2002, 2006, 2011, 2014   Depression    Diabetic peripheral neuropathy ([place]) 01/12/2019   GERD (gastroesophageal reflux disease)    H/O hiatal hernia    Headache    "at least 2/month" (01/16/2018)   Heart murmur    Hyperlipidemia    Hypertension    Memory change 05/14/2014   Migraine    "5-6/year"  (01/16/2018)   Obesity 01-2010   Obstructive sleep apnea    "can't wear machine; I have claustrophobia" (01/16/2018), states she had a 2nd sleep study and does not have sleep apnea, her O2 decreases and now is on 2 L of O2 at night.   On home oxygen therapy    "2L at night and prn during daytime" (01/16/2018)   Osteoarthritis    "knees and hands" (01/16/2018)   Peripheral vascular disease ([place])    ? numbness, tingling arms and legs   PONV (postoperative nausea and vomiting)    Stroke ([place])    2014, 2015, 2016   Type II diabetes mellitus ([place])    Ventral hernia    hx of    Past Surgical History:   Procedure Laterality Date   ANKLE FRACTURE SURGERY Left 1970's   APPENDECTOMY  1970's   w/hysterectomy   BASAL CELL CARCINOMA EXCISION Left 05/2014   "shoulder" (01/16/2018)   CARDIAC CATHETERIZATION  10/10/2012   Dr [name].   CARDIAC CATHETERIZATION N/A 05/29/2015   Procedure: Right/Left Heart Cath and Coronary/Graft Angiography;  Surgeon: Larey Dresser, MD;  Location: [facility];  Service: Cardiovascular;  Laterality: N/A;   CAROTID ENDARTERECTOMY Left 03/2013   CAROTID STENT INSERTION Left 03/20/2013   Procedure: CAROTID STENT INSERTION;  Surgeon: [name], MD;  Location: [facility] CATH LAB;  Service: Cardiovascular;  Laterality: Left;  internal carotid   CEREBRAL ANGIOGRAM N/A 04/05/2011   Procedure: CEREBRAL ANGIOGRAM;  Surgeon: Angelia Mould, MD;  Location: [facility] CATH LAB;  Service: Cardiovascular;  Laterality: N/A;   CHOLECYSTECTOMY OPEN  2004   CORONARY ANGIOPLASTY WITH STENT PLACEMENT  01,02,05,06,07,08,11; 04/24/2013   "I've probably got ~ 10 stents by now" (04/24/2013)   CORONARY ANGIOPLASTY WITH STENT PLACEMENT  06/13/2013   "got 4 stents today" (06/13/2013)   CORONARY ARTERY BYPASS GRAFT  1220/11   "CABG  X5"   CORONARY STENT INTERVENTION N/A 10/20/2017   Procedure: CORONARY STENT INTERVENTION;  Surgeon: Troy Sine, MD;  Location: [facility];  Service: Cardiovascular;  Laterality: N/A;   ESOPHAGOGASTRODUODENOSCOPY  08/03/2011   Procedure: ESOPHAGOGASTRODUODENOSCOPY (EGD);  Surgeon: Shann Medal, MD;  Location: [facility];  Service: General;  Laterality: N/A;   ESOPHAGOGASTRODUODENOSCOPY (EGD) WITH PROPOFOL N/A 03/11/2014   Procedure: ESOPHAGOGASTRODUODENOSCOPY (EGD) WITH PROPOFOL;  Surgeon: Lafayette Dragon, MD;  Location: [facility];  Service: Endoscopy;  Laterality: N/A;   FRACTURE SURGERY     gall stone removal  05/2003   GASTRIC RESTRICTION SURGERY  1984   "stapeling"   HERNIA REPAIR  2004   "in my stomach; had OR on it twice", wire mesh on 1 hernia    LEFT HEART CATH AND CORS/GRAFTS ANGIOGRAPHY N/A 07/09/2016   Procedure: LEFT HEART CATH AND CORS/GRAFTS ANGIOGRAPHY;  Surgeon: Larey Dresser, MD;  Location: [facility];  Service: Cardiovascular;  Laterality: N/A;   ORIF ANKLE FRACTURE Left 05/16/2018   ORIF ANKLE FRACTURE Left 05/16/2018   Procedure: OPEN REDUCTION INTERNAL FIXATION (ORIF) Left ankle with possible syndesmosis fixation;  Surgeon: Nicholes Stairs, MD;  Location: [facility];  Service: Orthopedics;  Laterality: Left;  180mn   OVARY SURGERY  1970's   "tumor removed"   PERCUTANEOUS CORONARY STENT INTERVENTION (PCI-S) N/A 06/13/2013   Procedure: PERCUTANEOUS CORONARY STENT INTERVENTION (PCI-S);  Surgeon: JJettie Booze MD;  [facility];  Service: Cardiovascular;  Laterality: N/A;   PERCUTANEOUS STENT INTERVENTION N/A 04/24/2013   Procedure: PERCUTANEOUS STENT INTERVENTION;  Surgeon: JJettie Booze MD;  [facility];  Service: Cardiovascular;  Laterality: N/A;   RIGHT/LEFT HEART CATH AND CORONARY/GRAFT ANGIOGRAPHY N/A 10/20/2017   Procedure: RIGHT/LEFT HEART CATH AND CORONARY/GRAFT ANGIOGRAPHY;  Surgeon: MLarey Dresser MD;  [facility];  Service: Cardiovascular;  Laterality: N/A;   ROOT CANAL  10/2000   TEE WITHOUT CARDIOVERSION N/A 01/18/2019   Procedure: TRANSESOPHAGEAL ECHOCARDIOGRAM (TEE);  Surgeon: MLarey Dresser MD;  [facility];  Service: Cardiovascular;  Laterality: N/A;   TIBIA FRACTURE SURGERY Right 1970's   rods and pins   TOOTH EXTRACTION     "1 on the upper; wisdom tooth on the lower" (01/16/2018)   TOTAL ABDOMINAL HYSTERECTOMY  1970's   w/ appendectomy    There were no vitals filed for this visit.   Subjective Assessment - 11/11/20 1103     Subjective no falls. same old aches and pains. therapy is helping get me stronger esp my legs- using rollator makes arms give out. pt verb doing ex at home    Currently in Pain? Yes    Pain Score 4     Pain  Location Back                               [facility] Adult PT Treatment/Exercise - 11/11/20 0001       Lumbar Exercises: Aerobic   UBE (Upper Arm Bike) L 2 3 min fwd/3 min back      Lumbar Exercises: Machines for Strengthening   Cybex Lumbar Extension black tband 2 sets 10   flex and ext   Other Lumbar Machine Exercise 20# seated row and lat 2 sets 10      Knee/Hip Exercises: Aerobic   Nustep L 5 8 min      Knee/Hip Exercises: Machines for Strengthening  Cybex Knee Extension 10# 2 sets 8    Cybex Knee Flexion 20# 2 sets 12      Knee/Hip Exercises: Standing   Other Standing Knee Exercises hip flex,ext and abd 15 x BIL with RW red tband   PTA to block left knee to prevent buckling with RT LE mvmt     Knee/Hip Exercises: Seated   Sit to Sand 3 sets;5 reps;without UE support   wt ball chest press                      PT Short Term Goals - 11/06/20 1153       PT SHORT TERM GOAL #1   Title Patient to be independent with initial HEP.    Status Achieved               PT Long Term Goals - 11/06/20 1154       PT LONG TERM GOAL #1   Title Patient to be independent with advanced HEP.    Status Partially Met      PT LONG TERM GOAL #2   Title Patient to demonstrate B LE strength >/=4+/5.    Status Partially Met      PT LONG TERM GOAL #3   Title Patient to score <14 sec on TUG testing in order to decrease risk of falls.    Baseline 15 sec with RW    Status Partially Met   18 sec with 4WW     PT LONG TERM GOAL #4   Title Patient to score <13 sec on 5xSTS in order to decrease risk of falls.    Baseline 28.29 sec pushing off from B armrests    Period Weeks    Status Partially Met   23.30 sec with B UE use     PT LONG TERM GOAL #5   Title Patient to report tolerance for 15 min of walking with LRAD without fatigue limiting.    Baseline patient guarded today and needed to hold wall due to dizziness.    Status On-going   reports tolerance  for 40-72f of walking before onset of LE "wobbliness"                  Plan - 11/11/20 1105     Clinical Impression Statement pt states she sees cardio Monday and Neuro 10/31. " I think I had a small stroke again stuttering more, and some facial droop" progressed LE/UE strength. PTA to support LLE to prevent buckling withRT leg mvmt.    PT Treatment/Interventions ADLs/Self Care Home Management;Cryotherapy;Electrical Stimulation;Iontophoresis 437mml Dexamethasone;Moist Heat;Gait training;Stair training;Functional mobility training;Therapeutic activities;Therapeutic exercise;Balance training;Neuromuscular re-education;Patient/family education;Manual techniques;Passive range of motion;Dry needling;Taping;Vasopneumatic Device;Joint Manipulations;Spinal Manipulations    PT Next Visit Plan standing LE strengthening and balance exercise with focus on decreasing UE use             Patient will benefit from skilled therapeutic intervention in order to improve the following deficits and impairments:  Decreased balance, Difficulty walking, Decreased activity tolerance, Decreased strength, Pain  Visit Diagnosis: Muscle weakness (generalized)  Difficulty in walking, not elsewhere classified  Unsteadiness on feet     Problem List Patient Active Problem List   Diagnosis Date Noted   Symptomatic bradycardia 01/16/2019   Normocytic anemia 01/16/2019   Diabetic peripheral neuropathy ([place]12/12/2018   Neurologic abnormality 12/25/2018   Debility 09/21/2018   Hypotension 09/18/2018   Bradycardia 09/18/2018   AKI (acute kidney injury) (HCJim Wells08/17/2020  Pressure injury of skin 09/18/2018   Ischemic cerebrovascular accident (CVA) of frontal lobe ([place]) 09/13/2018   Fall 09/09/2018   History of CVA (cerebrovascular accident) 09/09/2018   Type II diabetes mellitus with renal manifestations ([place]) 09/09/2018   CKD (chronic kidney disease), stage III 09/09/2018   Sepsis ([place]) 09/09/2018    Depression with anxiety 09/09/2018   Slurred speech 09/09/2018   Ankle fracture, left 05/16/2018   Amiodarone pulmonary toxicity    Physical deconditioning    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[place])    Acute on chronic congestive heart failure (HCC)    Chronic combined systolic (congestive) and diastolic (congestive) heart failure ([place]) 73/71/0626   Chronic systolic CHF (congestive heart failure) ([place]) 01/16/2018   Atrial fibrillation ([place]) 10/21/2017   CAD (coronary artery disease) of bypass graft 10/20/2017   Coronary artery disease of bypass graft of native heart with stable angina pectoris (HCC)    Chronic low back pain 08/24/2016   Pain of left thumb 08/05/2016   Nocturnal hypoxia 06/02/2016   Hoarseness 04/05/2016   Laryngopharyngeal reflux (LPR) 04/05/2016   Pharyngoesophageal dysphagia 04/05/2016   Angina decubitus ([place]) 05/22/2015   Chronic insomnia 05/06/2015   Common migraine 05/14/2014   Abnormality of gait 05/14/2014   Memory change 05/14/2014   Aneurysm, cerebral, nonruptured 05/14/2014   Cramp of limb-Left neck 05/10/2014   Hematemesis with nausea    Vomiting blood    Dizziness and giddiness 09/21/2013   Atypical chest pain 07/18/2013   Unstable angina ([place]) 04/09/2013   Carotid artery stenosis, symptomatic 03/20/2013   Cerebrovascular disease 07/10/2012   Cerebral artery occlusion with cerebral infarction ([place]) 07/10/2012   Mitral regurgitation 04/15/2012   TIA (transient ischemic attack) 04/15/2012   Occlusion and stenosis of carotid artery without mention of cerebral infarction 08/18/2011   Bariatric surgery status 06/17/2011   Speech abnormality 03/22/2011   Dyspnea 02/24/2011   PAPILLARY MUSCLE DYSFUNCTION, NON-RHEUMATIC 10/09/2008   UNSPECIFIED VITAMIN D DEFICIENCY 10/24/2007   MYOCARDIAL INFARCTION, HX OF 10/24/2007   PERSISTENT VOMITING 10/24/2007   OSTEOARTHRITIS 10/24/2007   MIGRAINES, HX OF 10/24/2007   Diabetes mellitus type 2, controlled, with  complications ([place]) 94/85/4627   Hyperlipemia 01/16/2007   Obesity-post failed open gastroplasty 1984  01/16/2007   OBSTRUCTIVE SLEEP APNEA 01/16/2007   Essential hypertension 01/16/2007   Coronary atherosclerosis 01/16/2007   Asthma 01/16/2007   GERD 01/16/2007   VENTRAL HERNIA 01/16/2007    Madolyn Ackroyd,ANGIE, PTA 11/11/2020, 11:45 AM  [place] [place], [place], [address] Phone: [phone]   Fax:  [phone]  Name: [name] [name] [name]: [id number] Date of Birth: January 21, 1946

## 2020-11-13 ENCOUNTER — Ambulatory Visit: Payer: Medicare Other | Admitting: Physical Therapy

## 2020-11-13 ENCOUNTER — Telehealth (HOSPITAL_BASED_OUTPATIENT_CLINIC_OR_DEPARTMENT_OTHER): Payer: Self-pay

## 2020-11-13 ENCOUNTER — Encounter: Payer: Self-pay | Admitting: Physical Therapy

## 2020-11-13 ENCOUNTER — Other Ambulatory Visit: Payer: Self-pay

## 2020-11-13 DIAGNOSIS — R2681 Unsteadiness on feet: Secondary | ICD-10-CM

## 2020-11-13 DIAGNOSIS — M6281 Muscle weakness (generalized): Secondary | ICD-10-CM

## 2020-11-13 DIAGNOSIS — R262 Difficulty in walking, not elsewhere classified: Secondary | ICD-10-CM

## 2020-11-13 DIAGNOSIS — R2689 Other abnormalities of gait and mobility: Secondary | ICD-10-CM

## 2020-11-13 NOTE — Therapy (Signed)
[place]. [place], [place], [address] Phone: [phone]   Fax:  [phone]  Physical Therapy Treatment  Patient Details  Name: [name] [name] [name]: [id number] Date of Birth: [birth year] Referring Provider (PT): Dr Loralie Champagne   Encounter Date: 11/13/2020   PT End of Session - 11/13/20 1144     Visit Number 12    Date for PT Re-Evaluation 12/19/20    PT Start Time 43    PT Stop Time 0352    PT Time Calculation (min) 45 min    Activity Tolerance Patient tolerated treatment well    Behavior During Therapy [facility] for tasks assessed/performed             Past Medical History:  Diagnosis Date   Anemia    hx   Anxiety    Asthma    Basal cell carcinoma 05/2014   "left shoulder"   Bundle branch block, left    chronic/notes 07/18/2013   CHF (congestive heart failure) ([place])    Chronic insomnia 05/06/2015   Chronic kidney disease    frequency, sees dr Jamal Maes every 4 to 6 months (01/16/2018)   Chronic lower back pain    Claustrophobia    Common migraine 05/14/2014   Coronary artery disease    MI in 2001, 2002, 2006, 2011, 2014   Depression    Diabetic peripheral neuropathy ([place]) 01/12/2019   GERD (gastroesophageal reflux disease)    H/O hiatal hernia    Headache    "at least 2/month" (01/16/2018)   Heart murmur    Hyperlipidemia    Hypertension    Memory change 05/14/2014   Migraine    "5-6/year"  (01/16/2018)   Obesity 01-2010   Obstructive sleep apnea    "can't wear machine; I have claustrophobia" (01/16/2018), states she had a 2nd sleep study and does not have sleep apnea, her O2 decreases and now is on 2 L of O2 at night.   On home oxygen therapy    "2L at night and prn during daytime" (01/16/2018)   Osteoarthritis    "knees and hands" (01/16/2018)   Peripheral vascular disease ([place])    ? numbness, tingling arms and legs   PONV (postoperative nausea and vomiting)    Stroke ([place])     2014, 2015, 2016   Type II diabetes mellitus ([place])    Ventral hernia    hx of    Past Surgical History:  Procedure Laterality Date   ANKLE FRACTURE SURGERY Left 1970's   APPENDECTOMY  1970's   w/hysterectomy   BASAL CELL CARCINOMA EXCISION Left 05/2014   "shoulder" (01/16/2018)   CARDIAC CATHETERIZATION  10/10/2012   Dr [name].   CARDIAC CATHETERIZATION N/A 05/29/2015   Procedure: Right/Left Heart Cath and Coronary/Graft Angiography;  Surgeon: Larey Dresser, MD;  Location: [place] [facility];  Service: Cardiovascular;  Laterality: N/A;   CAROTID ENDARTERECTOMY Left 03/2013   CAROTID STENT INSERTION Left 03/20/2013   Procedure: CAROTID STENT INSERTION;  Surgeon: [name], MD;  Location: [facility] C[facility] LAB;  Service: Cardiovascular;  Laterality: Left;  internal carotid   CEREBRAL ANGIOGRAM N/A 04/05/2011   Procedure: CEREBRAL ANGIOGRAM;  Surgeon: Angelia Mould, MD;  Location: [facility] CATH LAB;  Service: Cardiovascular;  Laterality: N/A;   CHOLECYSTECTOMY OPEN  2004   CORONARY ANGIOPLASTY WITH STENT PLACEMENT  01,02,05,06,07,08,11; 04/24/2013   "I've probably got ~ 10 stents by now" (04/24/2013)   CORONARY ANGIOPLASTY WITH STENT PLACEMENT  06/13/2013   "got 4 stents today" (06/13/2013)   CORONARY ARTERY BYPASS GRAFT  1220/11   "CABG X5"   CORONARY STENT INTERVENTION N/A 10/20/2017   Procedure: CORONARY STENT INTERVENTION;  Surgeon: Troy Sine, MD;  Location: [place] [facility];  Service: Cardiovascular;  Laterality: N/A;   ESOPHAGOGASTRODUODENOSCOPY  08/03/2011   Procedure: ESOPHAGOGASTRODUODENOSCOPY (EGD);  Surgeon: Shann Medal, MD;  Location: [facility];  Service: General;  Laterality: N/A;   ESOPHAGOGASTRODUODENOSCOPY (EGD) WITH PROPOFOL N/A 03/11/2014   Procedure: ESOPHAGOGASTRODUODENOSCOPY (EGD) WITH PROPOFOL;  Surgeon: Lafayette Dragon, MD;  Location: [facility] ENDOSCOPY;  Service: Endoscopy;  Laterality: N/A;   FRACTURE SURGERY     gall stone removal  05/2003   GASTRIC RESTRICTION  SURGERY  1984   "stapeling"   HERNIA REPAIR  2004   "in my stomach; had OR on it twice", wire mesh on 1 hernia   LEFT HEART CATH AND CORS/GRAFTS ANGIOGRAPHY N/A 07/09/2016   Procedure: LEFT HEART CATH AND CORS/GRAFTS ANGIOGRAPHY;  Surgeon: Larey Dresser, MD;  Location: [place] [facility];  Service: Cardiovascular;  Laterality: N/A;   ORIF ANKLE FRACTURE Left 05/16/2018   ORIF ANKLE FRACTURE Left 05/16/2018   Procedure: OPEN REDUCTION INTERNAL FIXATION (ORIF) Left ankle with possible syndesmosis fixation;  Surgeon: Nicholes Stairs, MD;  Location: [place];  Service: Orthopedics;  Laterality: Left;  164mn   OVARY SURGERY  1970's   "tumor removed"   PERCUTANEOUS CORONARY STENT INTERVENTION (PCI-S) N/A 06/13/2013   Procedure: PERCUTANEOUS CORONARY STENT INTERVENTION (PCI-S);  Surgeon: JJettie Booze MD;  [facility];  Service: Cardiovascular;  Laterality: N/A;   PERCUTANEOUS STENT INTERVENTION N/A 04/24/2013   Procedure: PERCUTANEOUS STENT INTERVENTION;  Surgeon: JJettie Booze MD;  [facility];  Service: Cardiovascular;  Laterality: N/A;   RIGHT/LEFT HEART CATH AND CORONARY/GRAFT ANGIOGRAPHY N/A 10/20/2017   Procedure: RIGHT/LEFT HEART CATH AND CORONARY/GRAFT ANGIOGRAPHY;  Surgeon: MLarey Dresser MD;  [facility];  Service: Cardiovascular;  Laterality: N/A;   ROOT CANAL  10/2000   TEE WITHOUT CARDIOVERSION N/A 01/18/2019   Procedure: TRANSESOPHAGEAL ECHOCARDIOGRAM (TEE);  Surgeon: MLarey Dresser MD;  [facility];  Service: Cardiovascular;  Laterality: N/A;   TIBIA FRACTURE SURGERY Right 1970's   rods and pins   TOOTH EXTRACTION     "1 on the upper; wisdom tooth on the lower" (01/16/2018)   TOTAL ABDOMINAL HYSTERECTOMY  1970's   w/ appendectomy    There were no vitals filed for this visit.   Subjective Assessment - 11/13/20 1104     Subjective L leg is dragging a little bit today    Currently in Pain? Yes    Pain Score 4      Pain Location Back                               [facility] Adult PT Treatment/Exercise - 11/13/20 0001       Knee/Hip Exercises: Aerobic   Nustep L 5 7 min      Knee/Hip Exercises: Standing   Other Standing Knee Exercises Standing merch with rollator 2x5      Knee/Hip Exercises: Seated   Long Arc Quad Strengthening;Both;2 sets;10 reps;Weights    Long Arc Quad Weight 3 lbs.    Knee/Hip Flexion Fitter press two blue 2x15    Marching Strengthening;Both;2 sets;10 reps    Marching Weights 3 lbs.    Hamstring  Curl Both;2 sets;10 reps    Hamstring Limitations gren tband    Sit to Sand 2 sets;10 reps;without UE support   holding yellow ball                      PT Short Term Goals - 11/06/20 1153       PT SHORT TERM GOAL #1   Title Patient to be independent with initial HEP.    Status Achieved               PT Long Term Goals - 11/06/20 1154       PT LONG TERM GOAL #1   Title Patient to be independent with advanced HEP.    Status Partially Met      PT LONG TERM GOAL #2   Title Patient to demonstrate B LE strength >/=4+/5.    Status Partially Met      PT LONG TERM GOAL #3   Title Patient to score <14 sec on TUG testing in order to decrease risk of falls.    Baseline 15 sec with RW    Status Partially Met   18 sec with 4WW     PT LONG TERM GOAL #4   Title Patient to score <13 sec on 5xSTS in order to decrease risk of falls.    Baseline 28.29 sec pushing off from B armrests    Period Weeks    Status Partially Met   23.30 sec with B UE use     PT LONG TERM GOAL #5   Title Patient to report tolerance for 15 min of walking with LRAD without fatigue limiting.    Baseline patient guarded today and needed to hold wall due to dizziness.    Status On-going   reports tolerance for 40-52f of walking before onset of LE "wobbliness"                  Plan - 11/13/20 1144     Clinical Impression Statement Pt reports no falls in a  week. Pt did well overall today. Pt reported that's he fitter press really worked her LLE. Therapist had to assist in blocking LLE while ding standing marches, LLE did give some. Strong focus on good quad contraction with LAQ. Fatigue reported post treatment.    Personal Factors and Comorbidities Age;Comorbidity 2    Comorbidities CHF, Pneumonia    Examination-Activity Limitations Locomotion Level;Stairs    Examination-Participation Restrictions Occupation;Community Activity    Stability/Clinical Decision Making Evolving/Moderate complexity    Rehab Potential Good    PT Frequency 2x / week    PT Duration 12 weeks    PT Treatment/Interventions ADLs/Self Care Home Management;Cryotherapy;Electrical Stimulation;Iontophoresis 481mml Dexamethasone;Moist Heat;Gait training;Stair training;Functional mobility training;Therapeutic activities;Therapeutic exercise;Balance training;Neuromuscular re-education;Patient/family education;Manual techniques;Passive range of motion;Dry needling;Taping;Vasopneumatic Device;Joint Manipulations;Spinal Manipulations    PT Next Visit Plan standing LE strengthening and balance exercise with focus on decreasing UE use             Patient will benefit from skilled therapeutic intervention in order to improve the following deficits and impairments:  Decreased balance, Difficulty walking, Decreased activity tolerance, Decreased strength, Pain  Visit Diagnosis: Difficulty in walking, not elsewhere classified  Unsteadiness on feet  Muscle weakness (generalized)  Other abnormalities of gait and mobility     Problem List Patient Active Problem List   Diagnosis Date Noted   Symptomatic bradycardia 01/16/2019   Normocytic anemia 01/16/2019   Di[place] peripheral [place]/12/2018  Neurologic abnormality 12/25/2018   Debility 09/21/2018   Hypotension 09/18/2018   Bradycardia 09/18/2018   AKI (acute kidney injury) ([place]) 09/18/2018   Pressure injury of  skin 09/18/2018   Ischemic cerebrovascular accident (CVA) of frontal lobe ([place]) 09/13/2018   Fall 09/09/2018   History of CVA (cerebrovascular accident) 09/09/2018   Type II diabetes mellitus with renal manifestations ([place]) 09/09/2018   CKD (chronic kidney disease), stage III 09/09/2018   Sepsis ([place]) 09/09/2018   Depression with anxiety 09/09/2018   Slurred speech 09/09/2018   Ankle fracture, left 05/16/2018   Amiodarone pulmonary toxicity    Physical deconditioning    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[place])    Acute on chronic congestive heart failure (HCC)    Chronic combined systolic (congestive) and diastolic (congestive) heart failure ([place]) 71/07/2692   Chronic systolic CHF (congestive heart failure) ([place]) 01/16/2018   Atrial fibrillation ([place]) 10/21/2017   CAD (coronary artery disease) of bypass graft 10/20/2017   Coronary artery disease of bypass graft of native heart with stable angina pectoris (HCC)    Chronic low back pain 08/24/2016   Pain of left thumb 08/05/2016   Nocturnal hypoxia 06/02/2016   Hoarseness 04/05/2016   Laryngopharyngeal reflux (LPR) 04/05/2016   Pharyngoesophageal dysphagia 04/05/2016   Angina decubitus ([place]) 05/22/2015   Chronic insomnia 05/06/2015   Common migraine 05/14/2014   Abnormality of gait 05/14/2014   Memory change 05/14/2014   Aneurysm, cerebral, nonruptured 05/14/2014   Cramp of limb-Left neck 05/10/2014   Hematemesis with nausea    Vomiting blood    Dizziness and giddiness 09/21/2013   Atypical chest pain 07/18/2013   Unstable angina (HCC) 04/09/2013   Carotid artery stenosis, symptomatic 03/20/2013   Cerebrovascular disease 07/10/2012   Cerebral artery occlusion with cerebral infarction ([employer]) 07/10/2012   Mitral regurgitation 04/15/2012   TIA (transient ischemic attack) 04/15/2012   Occlusion and stenosis of carotid artery without mention of cerebral infarction 08/18/2011   Bariatric surgery status 06/17/2011   Speech abnormality  03/22/2011   Dyspnea 02/24/2011   PAPILLARY MUSCLE DYSFUNCTION, NON-RHEUMATIC 10/09/2008   UNSPECIFIED VITAMIN D DEFICIENCY 10/24/2007   MYOCARDIAL INFARCTION, HX OF 10/24/2007   PERSISTENT VOMITING 10/24/2007   OSTEOARTHRITIS 10/24/2007   MIGRAINES, HX OF 10/24/2007   Diabetes mellitus type 2, controlled, with complications ([place]) 85/46/2703   Hyperlipemia 01/16/2007   Obesity-post failed open gastroplasty 1984  01/16/2007   OBSTRUCTIVE SLEEP APNEA 01/16/2007   Essential hypertension 01/16/2007   Coronary atherosclerosis 01/16/2007   Asthma 01/16/2007   GERD 01/16/2007   VENTRAL HERNIA 01/16/2007    Scot Jun, PTA 11/13/2020, 11:49 AM  [place]. [place], [place], [address] Phone: [phone]   Fax:  [phone]  Name: [name] [name] [name]: [id number] Date of Birth: [birth year]

## 2020-11-17 ENCOUNTER — Other Ambulatory Visit: Payer: Self-pay

## 2020-11-17 ENCOUNTER — Ambulatory Visit (HOSPITAL_COMMUNITY)
Admission: RE | Admit: 2020-11-17 | Discharge: 2020-11-17 | Disposition: A | Discharge status: Home / Self Care | Payer: Medicare Other | Source: Ambulatory Visit | Attending: Cardiology | Admitting: Cardiology

## 2020-11-17 ENCOUNTER — Encounter (HOSPITAL_COMMUNITY): Payer: Self-pay | Admitting: Cardiology

## 2020-11-17 VITALS — BP 118/68 | HR 55 | Wt 213.0 lb

## 2020-11-17 DIAGNOSIS — Z8673 Personal history of transient ischemic attack (TIA), and cerebral infarction without residual deficits: Secondary | ICD-10-CM | POA: Diagnosis not present

## 2020-11-17 DIAGNOSIS — R001 Bradycardia, unspecified: Secondary | ICD-10-CM | POA: Insufficient documentation

## 2020-11-17 DIAGNOSIS — E1122 Type 2 diabetes mellitus with diabetic chronic kidney disease: Secondary | ICD-10-CM | POA: Diagnosis not present

## 2020-11-17 DIAGNOSIS — Z951 Presence of aortocoronary bypass graft: Secondary | ICD-10-CM | POA: Insufficient documentation

## 2020-11-17 DIAGNOSIS — I2510 Atherosclerotic heart disease of native coronary artery without angina pectoris: Secondary | ICD-10-CM | POA: Insufficient documentation

## 2020-11-17 DIAGNOSIS — Z79899 Other long term (current) drug therapy: Secondary | ICD-10-CM | POA: Insufficient documentation

## 2020-11-17 DIAGNOSIS — Z794 Long term (current) use of insulin: Secondary | ICD-10-CM | POA: Diagnosis not present

## 2020-11-17 DIAGNOSIS — Z880 Allergy status to penicillin: Secondary | ICD-10-CM | POA: Diagnosis not present

## 2020-11-17 DIAGNOSIS — I25119 Atherosclerotic heart disease of native coronary artery with unspecified angina pectoris: Secondary | ICD-10-CM

## 2020-11-17 DIAGNOSIS — I130 Hypertensive heart and chronic kidney disease with heart failure and stage 1 through stage 4 chronic kidney disease, or unspecified chronic kidney disease: Secondary | ICD-10-CM | POA: Diagnosis not present

## 2020-11-17 DIAGNOSIS — I080 Rheumatic disorders of both mitral and aortic valves: Secondary | ICD-10-CM | POA: Diagnosis not present

## 2020-11-17 DIAGNOSIS — I480 Paroxysmal atrial fibrillation: Secondary | ICD-10-CM

## 2020-11-17 DIAGNOSIS — G4733 Obstructive sleep apnea (adult) (pediatric): Secondary | ICD-10-CM | POA: Diagnosis not present

## 2020-11-17 DIAGNOSIS — I5022 Chronic systolic (congestive) heart failure: Secondary | ICD-10-CM | POA: Diagnosis not present

## 2020-11-17 DIAGNOSIS — I5042 Chronic combined systolic (congestive) and diastolic (congestive) heart failure: Secondary | ICD-10-CM | POA: Diagnosis present

## 2020-11-17 DIAGNOSIS — Z9981 Dependence on supplemental oxygen: Secondary | ICD-10-CM | POA: Insufficient documentation

## 2020-11-17 DIAGNOSIS — Z7901 Long term (current) use of anticoagulants: Secondary | ICD-10-CM | POA: Insufficient documentation

## 2020-11-17 DIAGNOSIS — Z8249 Family history of ischemic heart disease and other diseases of the circulatory system: Secondary | ICD-10-CM | POA: Diagnosis not present

## 2020-11-17 DIAGNOSIS — Z7951 Long term (current) use of inhaled steroids: Secondary | ICD-10-CM | POA: Diagnosis not present

## 2020-11-17 DIAGNOSIS — I447 Left bundle-branch block, unspecified: Secondary | ICD-10-CM | POA: Insufficient documentation

## 2020-11-17 DIAGNOSIS — Z888 Allergy status to other drugs, medicaments and biological substances status: Secondary | ICD-10-CM | POA: Diagnosis not present

## 2020-11-17 DIAGNOSIS — Z881 Allergy status to other antibiotic agents status: Secondary | ICD-10-CM | POA: Insufficient documentation

## 2020-11-17 DIAGNOSIS — E785 Hyperlipidemia, unspecified: Secondary | ICD-10-CM | POA: Insufficient documentation

## 2020-11-17 DIAGNOSIS — Z955 Presence of coronary angioplasty implant and graft: Secondary | ICD-10-CM | POA: Diagnosis not present

## 2020-11-17 DIAGNOSIS — Z833 Family history of diabetes mellitus: Secondary | ICD-10-CM | POA: Insufficient documentation

## 2020-11-17 DIAGNOSIS — Z6838 Body mass index (BMI) 38.0-38.9, adult: Secondary | ICD-10-CM | POA: Insufficient documentation

## 2020-11-17 DIAGNOSIS — N189 Chronic kidney disease, unspecified: Secondary | ICD-10-CM | POA: Insufficient documentation

## 2020-11-17 DIAGNOSIS — Z7902 Long term (current) use of antithrombotics/antiplatelets: Secondary | ICD-10-CM | POA: Insufficient documentation

## 2020-11-17 LAB — BASIC METABOLIC PANEL
Anion gap: 9 (ref 5–15)
BUN: 30 mg/dL — ABNORMAL HIGH (ref 8–23)
CO2: 28 mmol/L (ref 22–32)
Calcium: 9.2 mg/dL (ref 8.9–10.3)
Chloride: 103 mmol/L (ref 98–111)
Creatinine, Ser: 1.38 mg/dL — ABNORMAL HIGH (ref 0.44–1.00)
GFR, Estimated: 41 mL/min — ABNORMAL LOW (ref >60)
Glucose, Bld: 158 mg/dL — ABNORMAL HIGH (ref 70–99)
Potassium: 4.6 mmol/L (ref 3.5–5.1)
Sodium: 140 mmol/L (ref 135–145)

## 2020-11-17 LAB — BRAIN NATRIURETIC PEPTIDE: B Natriuretic Peptide: 86.6 pg/mL (ref 0.0–100.0)

## 2020-11-17 MED ORDER — TORSEMIDE 10 MG PO TABS
10.0000 mg | ORAL_TABLET | Freq: Every day | ORAL | 3 refills | Status: DC
Start: 2020-11-17 — End: 2020-12-29

## 2020-11-17 MED ORDER — AMLODIPINE BESYLATE 5 MG PO TABS: ORAL_TABLET | ORAL | 3 refills | Status: DC

## 2020-11-17 NOTE — Patient Instructions (Signed)
EKG done today.  Labs done today. We will contact you only if your labs are abnormal.  DECREASE Amlodipine to 5mg  (1/2 tablet) by mouth daily.   DECREASE Torsemide to 10mg  (1 tablet) by mouth daily.   No other medication changes were made. Please continue all current medications as prescribed.  Your physician recommends that you schedule a follow-up appointment soon for an echo and in 1 month with our NP/[facility] here in our office.   If you have any questions or concerns before your next appointment please send [place] a message through [place] or call our office at [phone].    TO LEAVE A MESSAGE FOR THE NURSE SELECT OPTION 2, PLEASE LEAVE A MESSAGE INCLUDING: YOUR NAME DATE OF BIRTH CALL BACK NUMBER REASON FOR CALL**this is important as we prioritize the call backs  YOU WILL RECEIVE A CALL BACK THE SAME DAY AS LONG AS YOU CALL BEFORE 4:00 PM   Do the following things EVERYDAY: Weigh yourself in the morning before breakfast. Write it down and keep it in a log. Take your medicines as prescribed Eat low salt foods--Limit salt (sodium) to 2000 mg per day.  Stay as active as you can everyday Limit all fluids for the day to less than 2 liters   At the [facility], you and your health needs are our priority. As part of our continuing mission to provide you with exceptional heart care, we have created designated Provider Care Teams. These Care Teams include your primary Cardiologist (physician) and Advanced Practice Providers (APPs- Physician Assistants and Nurse Practitioners) who all work together to provide you with the care you need, when you need it.   You may see any of the following providers on your designated Care Team at your next follow up: Dr Glori Bickers Dr Haynes Kerns, NP Lyda Jester, [place] Audry Riles, PharmD   Please be sure to bring in all your medications bottles to every appointment.

## 2020-11-18 ENCOUNTER — Ambulatory Visit: Payer: Medicare Other | Admitting: Physical Therapy

## 2020-11-18 NOTE — Progress Notes (Signed)
Date:  11/18/2020   ID:  [name], DOB 12-18-1945, [name] [id number]   Provider location: [facility] Type of Visit: Established patient   PCP:  Derinda Late, MD  Cardiologist:  Dr. [name]  Chief Complaint: Exertional shortness of breath   History of Present Illness: [name] [name] is a 75 y.o. female who has a history of CAD s/p CABG, diastolic CHF, and cerebrovascular disease with history of CVA presents for followup of CHF and diastolic CHF. She had CABG x 5 in 12/11.  Prior to the CABG she had multiple PCIs.  She had a CVA in 2/14 that presented as visual loss.  She had a left carotid stent in 2/15.    Left heart cath done in Sept. 2014. This showed patent SVG-D, LIMA-LAD, and sequential SVG-OM branches but SVG-RCA and the native RCA were both totally occluded. It was felt aht her increased symptoms coincided with occlusion of SVG-RCA.  She was started on Imdur to see if this would help with the chest pain and dyspnea.  However, she feels like Imdur causes leg cramps and does not think that she can take it.  So ranolazine 500 mg bid started and titrated this up to 1000 mg bid.  This helped some but not markedly.  Therefore, sent to  Dr. Irish Lack to address opening RCA CTO. He was able to do this in 5/15 with 4 overlapping Xience DES.  This led to resolution of exertional chest pain.     Mrs Neuzil was started on Brilinta after PCI.  She became much more short of breath after starting on Brilinta. Per Dr Delano Metz stopped and replaced with Plavix.  Dyspnea improved off Brilinta. Given increasing exertional dyspnea and chest pain,  RHC/LHC was done in 4/17.  This showed stable CAD with no interventional target.  Left and right heart filling pressures were not significantly increased.  Medical management.    She had an MRI/MRA in May 2018 that showed moderately severe stenosis of the supraclinoid segment of the right internal carotid artery, progressed  since the 2016 MR angiogram and severe basilar artery stenosis.    She developed recurrent exertional chest pain and had LHC in 6/18, showing 4/5 grafts patent with patent native RCA (similar to prior cath, no changes).     Echo 1/19 with EF 55-60%, moderate diastolic dysfunction, PASP 50 mmHg, mildly dilated RV, mild AS, mild-moderate MR.    She reported increased dyspnea and chest pain and had RHC/LHC in 9/19.  She had DES to sequential SVG-OM1/PLOM.  While hospitalized, she was noted to have runs of atrial fibrillation.  She was very symptomatic with the atrial fibrillation.  Eliquis and amiodarone were started.    She was admitted 12/16-12/19/19 for cardiomems implant and RHC. She was diuresed with IV lasix and transitioned to torsemide 100 mg BID + metolazone once/week. DC weight: 295 lbs.   She had a prolonged hospitalization in 2/20 with acute respiratory failure and fever to 103.  She was thought to have PNA and treated with cefepime.  She was intubated.  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  Amiodarone was stopped and she was put on prednisone. Echo in 2/20 showed EF down to 25-30% with mid-apical LV severe hypokinesis. She had AKI. Possible ITP triggered by infection, HIT negative.  ITP was treated with Solumedrol. She was discharged to SNF.    ORIF left ankle 4/20.   11/20  admission initially with concern for TIAs (staring spells), ended up thinking possible partial seizures.  Head MRI did not show CVA.  Echo in 11/20 showed EF 45-50%, moderately decreased RV systolic function, moderate RV enlargement, moderate-severe MR, moderate TR.   Patient was admitted again in 12/20 with AKI and hypotension in the setting of sinus bradycardia (HR 30s).  She was volume overloaded on exam.  Toprol XL was stopped and HR increased to 60s.  She was diuresed with IV Lasix.  TEE in 12/20 showed EF 50-55%, septal-lateral dyssynchrony, mildly decreased RV function, moderate  MR.   Atypical chest pain in 1/21, Cardiolite showed EF 57%, fixed inferior defect (likely artifact), no ischemia.   Echo in 5/22 showed EF 60-65%, moderate LVH, normal RV, PASP 37 mmHg, 2.5 x 2.1 cm calcified mass posterior mitral annulus with mild-moderate MR => ?severe MAC but more circumscribed and prominent than in the past, mild AS mean gradient 12 mmHg and AVA 1.83 cm^2.   She returns today for followup of CHF and CAD.  Cardiomems has been stable and below goal, PADP 6 today. She is in NSR, no palpitations.  She has chronic dyspnea walking about 50-100 feet.  She continues to use a walker for stability.  Rare atypical chest pain.  Weight is up about 5 lbs.  She has some lightheadedness when she stands.  Legs feel weak and she is fatigued in general.   ECG (personally reviewed): NSR, LBBB 172 msec  Labs (6/14): K 3.8, creatinine 0.71 Labs (8/14): BNP 249, LDL 166 Labs (9/14): K 4.7, creatinine 0.7 Labs (9/14): K 4.6, creatinine 0.9, BNP 109 Labs (1/15): K 4.5, creatinine 0.73, LDL 212, HCT 43.4, TSH normal, BNP 138 Labs (6/15): K 4.7, creatinine 1.17, LDL 100, HDL 37, TGs 363, BNP 39 Labs (10/15): K 4.6, creatinine 1.2 Labs (1/16): LDL 157, HDL 43, K 5.2, creatinine 0.95 Labs (2/16): K 4.5, creatinine 1.08, BNP 113 Labs (4/16): LDL 50, HDL 56, TGs 179 Labs (9/16): K 5.3, creatinine 1.09 Labs (10/16): LDL 75, HDL 56 Labs (2/17): K 5.2, creatinine 1.07 Labs (4/17): K 5.1, creatinine 1.07 Labs (5/17): K 4.5, creatinine 1.01, LDL 96, HDL 56, BNP 70 Labs (03/2016) K 4.9, creatinine 1.07 Labs (7/18): K 4.9, creatinine 1.15 Labs (11/18): K 5.1, creatinine 1.11, LDL 36 Labs (1/19): K 4.9, creatinine 1.22 Labs (9/19): LDL 108 Labs (10/19): hgb 14.5, K 4.3, creatinine 1.03 Labs 12/06/2017: K 4.7 Creatinine 1.7 Labs (3/20): K 4.8, creatinine 1.39, hgb 12.4 plts 226 Labs (11/20): K 4.2, creatinine 1.01, LDL 38 Labs (12/20): K 3.5, creatinine 1.8 Labs (3/21): K 4.5, creatinine 1.5 Labs  (2/22): K 4.2, creatinine 1.13 Labs (4/22): LDL 58 Labs (9/22): K 4.3, creatinine 1.27   PMH: 1. Diabetic gastroparesis 2. Type II diabetes 3. HTN 4. Morbid obesity 5. CAD: s/p CABG in 12/11 after prior PCIs.  LIMA-LAD, SVG-D, seq SVG-OM1 and OM2, SVG-PDA. Adenosine Cardiolite (8/14) with EF 53% and a small reversible apical defect with a medium, partially reversible inferior defect.  LHC (9/14) with patent SVG-D, LIMA-LAD (50% distal LAD), and sequential SVG-OM branches; the SVG-RCA and the native RCA were occluded.  This was managed medically initially, but with ongoing exertional chest pain, it was decided to open CTO.  Patient had CTO opening with 4 overlapping Xience DES in the RCA in 5/15.   - LHC (4/17): SVG-D patent, LIMA-LAD patent, distal LAD with several 40-50% stenoses, sequential SVG-OM1 and PLOM patent with 50% proximal stenosis (not flow limiting), SVG-RCA  TO with patent RCA stents.  - LHC (6/18): 4/5 grafts patent (SVG-RCA TO, same as past), RCA stents patent => no change.  - LHC (9/19): Sequential SVG-PLOM/OM1 with 80% proximal stenosis, s/p DES.  - Cardiolite (1/21): EF 57%, fixed inferior defect (likely artifact), no ischemia.  6. Atypical migraines 7. OHS/OSA: Intolerant of CPAP. Uses oxygen with exertion and at night.  8. GERD with hiatal hernia 9. OA 10. Chronic systolic CHF: Echo (95/28) with EF 50-55%, grade II diastolic dysfunction, mild-moderate MR.  Echo (8/14) with EF 55-60%, grade II diastolic dysfunction, mildly increased aortic valve gradient (mean 12 mmHg) but valve opens well, mild MR and mild RV dilation.  Echo (9/15) with EF 60-65%, grade II diastolic dysfunction, mild aortic stenosis, mild mitral stenosis, mild to moderate mitral regurgitation, mildly dilated RV with normal systolic function, PA systolic pressure 46 mmHg.  - RHC (4/17): mean RA 9, PA 38/15 mean 27, mean PCWP 9, CI 2.5.  - Echo (5/17): EF 55-60%, mild LVH, mildly dilated RV with low normal  systolic function, moderate TR, PASP 61 mmHg - Echo (1/19): EF 55-60%, moderate diastolic dysfunction, PASP 50 mmHg, mildly dilated RV, mild AS, mild-moderate MR. - RHC (9/19): mean RA 11, PA 34/12, mean PCWP 15, CI 2.85 - Echo (3/20): EF 25-30% with mid-apical severe hypokinesis.  - Echo (11/20): EF 45-50%, moderately dilated RV with moderately decreased systolic function, moderate-severe MR, moderate TR.  - TEE (12/20): EF 50-55%, septal-lateral dyssynchrony, moderate RV dilation/mildly decreased function, peak RV-RA gradient 51 mmHg, moderate MR.  - Echo (5/22): EF 60-65%, moderate LVH, normal RV, PASP 37 mmHg, 2.5 x 2.1 cm calcified mass posterior mitral annulus with mild-moderate MR => ?severe MAC but more circumscribed and prominent than in the past, mild AS mean gradient 12 mmHg and AVA 1.83 cm^2.  11. CKD 12. Chronic LBBB 13. Anxiety 14. Carotid stenosis: Followed by [facility], >41% LICA stenosis 32/44.  She had left carotid stent in 2/15. Carotid dopplers 8/15 with no significant disease. Carotid dopplers (4/16): < 40% RICA stenosis.  - Carotid dopplers (9/22): 1-39% BICA stenosis.  15. Cerebrovascular disease: CVA 2/14 with right posterior cerebral artery territory ischemic infarction. Cerebral angiogram in 6/14 showed 70% right vertebral ostial stenosis, 01-02% LICA stenosis, > 72% proximal left posterior cerebral artery stenosis, posterior communicating artery aneurysm.  Patient has had episodes of transient expressive aphasia.  Carotid dopplers (53/66) showed >44% LICA stenosis. She had a left carotid stent 2/15.  Possible CVA in 7/15. -  MRI/MRA in May 2018 that showed moderately severe stenosis of the supraclinoid segment of the right internal carotid artery, progressed since the 2016 MR angiogram and severe basilar artery stenosis.  16. Positional vertigo (suspected) 17. Palpitations: Holter (6/15) with rare PVCs/PACs.  - Event monitor (2/18): NSR, occasional PVCs 18. Dyspnea with  Brilinta. 19. Melanoma: On face, s/p excision.   20. Aortic stenosis: Mild on 5/22 echo.  21. Holter (8/16) with no significant arrhythmias.  22. Lower extremity arterial dopplers (9/16) were normal.  23. Sleep study (4/18): No significant OSA.  24. Atrial fibrillation: Paroxysmal - Amiodarone stopped, ?lung toxicity.  25. H/o ITP in 3/20.  16. Partial seizures 17. Mitral regurgitation: Moderate on TEE in 12/20.  - Mild to moderate on 5/22 echo.    Current Outpatient Medications  Medication Sig Dispense Refill   acetaminophen (TYLENOL) 500 MG tablet Take 500 mg by mouth every 6 (six) hours as needed for mild pain, moderate pain, fever or headache.  albuterol (VENTOLIN HFA) 108 (90 Base) MCG/ACT inhaler Inhale 2 puffs into the lungs every 4 (four) hours as needed for wheezing or shortness of breath.      Alirocumab (PRALUENT) 150 MG/ML SOAJ Inject 150 mg/mL into the skin every 14 (fourteen) days.     ALPRAZolam (XANAX) 0.5 MG tablet Take 1-2 tablets (0.5-1 mg total) by mouth See admin instructions. Take one tablet (0.5 mg) by mouth every morning and two tablets (1 mg) at night, may also take 1 tablet (0.5 mg) midday as needed for anxiety     B Complex-C (B-COMPLEX WITH VITAMIN C) tablet Take 1 tablet by mouth daily.     bisacodyl (DULCOLAX) 5 MG EC tablet Take 5 mg by mouth daily as needed for moderate constipation.     buPROPion (WELLBUTRIN XL) 150 MG 24 hr tablet Take 150 mg by mouth every morning.     cholecalciferol (VITAMIN D3) 25 MCG (1000 UNIT) tablet Take 1,000 Units by mouth daily.     clopidogrel (PLAVIX) 75 MG tablet TAKE ONE TABLET BY MOUTH AT BEDTIME 90 tablet 3   denosumab (PROLIA) 60 MG/ML SOSY injection Inject 60 mg into the skin every 6 (six) months.      donepezil (ARICEPT) 5 MG tablet Take 1 tablet (5 mg total) by mouth at bedtime. 30 tablet 0   DULoxetine (CYMBALTA) 30 MG capsule Take 1 capsule (30 mg total) by mouth daily. 30 capsule 0   ELIQUIS 5 MG TABS tablet  TAKE ONE TABLET BY MOUTH TWICE DAILY 180 tablet 3   ezetimibe (ZETIA) 10 MG tablet Take 1 tablet (10 mg total) by mouth at bedtime.     Fluticasone-Salmeterol (ADVAIR) 250-50 MCG/DOSE AEPB Inhale 1 puff into the lungs daily.     gabapentin (NEURONTIN) 600 MG tablet Take 600 mg by mouth 4 (four) times daily as needed.     Insulin Glargine (LANTUS SOLOSTAR) 100 UNIT/ML Solostar Pen Inject 38-44 Units into the skin See admin instructions. Inject 44 units subcutaneously daily at bedtime, increase to 44 units for CBG >200     insulin lispro (HUMALOG) 100 UNIT/ML KwikPen Inject 14-20 Units into the skin See admin instructions. Inject 14 units subcutaneously prior to breakfast and supper; add 4 units for CBG >200     ipratropium-albuterol (DUONEB) 0.5-2.5 (3) MG/3ML SOLN Take 3 mLs by nebulization 2 (two) times daily as needed (asthma).     methocarbamol (ROBAXIN) 500 MG tablet Take 500 mg by mouth 3 (three) times daily as needed.     Multiple Vitamin (MULTIVITAMIN WITH MINERALS) TABS tablet Take 1 tablet by mouth every morning. Centrum - Women over 55     Multiple Vitamins-Minerals (OCUVITE EYE HEALTH FORMULA) CAPS Take 1 capsule by mouth every morning.     nitroGLYCERIN (NITROSTAT) 0.3 MG SL tablet Place 1 tablet (0.3 mg total) under the tongue every 5 (five) minutes x 3 doses as needed for chest pain. 25 tablet 1   ondansetron (ZOFRAN-ODT) 4 MG disintegrating tablet Take 4 mg by mouth 2 (two) times daily as needed for nausea or vomiting.     OXYGEN Inhale 2 L into the lungs at bedtime as needed (shortness of breath).      pantoprazole (PROTONIX) 40 MG tablet Take 1 tablet (40 mg total) by mouth daily. 30 tablet 0   Polyvinyl Alcohol-Povidone (REFRESH OP) Place 1 drop into both eyes daily as needed (redness/ dry eyes).     potassium chloride SA (KLOR-CON) 20 MEQ tablet TAKE 2 TABLETS  BY MOUTH EVERY MORNING AND 1 TABLET EVERY EVENING 90 tablet 3   Pyridoxine HCl (VITAMIN B-6 PO) Take 1 tablet by mouth  every morning.     ranolazine (RANEXA) 1000 MG SR tablet TAKE ONE TABLET BY MOUTH TWICE DAILY 180 tablet 3   rosuvastatin (CRESTOR) 40 MG tablet Take 1 tablet (40 mg total) by mouth at bedtime. 30 tablet 0   sacubitril-valsartan (ENTRESTO) 97-103 MG Take 1 tablet by mouth 2 (two) times daily. 60 tablet 11   spironolactone (ALDACTONE) 25 MG tablet TAKE 1 TABLET BY MOUTH DAILY 30 tablet 6   traZODone (DESYREL) 50 MG tablet Take 1 tablet (50 mg total) by mouth at bedtime. 90 tablet 1   vitamin B-12 (CYANOCOBALAMIN) 1000 MCG tablet Take 1,000 mcg by mouth every morning.      zolpidem (AMBIEN) 10 MG tablet Take 10 mg by mouth at bedtime as needed for sleep.     amLODipine (NORVASC) 5 MG tablet TAKE ONE (1) TABLET BY MOUTH EACH DAY 90 tablet 3   torsemide (DEMADEX) 10 MG tablet Take 1 tablet (10 mg total) by mouth daily. 90 tablet 3   No current facility-administered medications for this encounter.    Allergies:   Amoxicillin, Brilinta [ticagrelor], Erythromycin, Flagyl [metronidazole], Penicillins, Isosorbide mononitrate [isosorbide nitrate], Jardiance [empagliflozin], Metformin and related, Tape, and Erythromycin base   Social History:  The patient  reports that she has never smoked. She has never used smokeless tobacco. She reports that she does not drink alcohol and does not use drugs.   Family History:  The patient's family history includes AAA (abdominal aortic aneurysm) in her father and mother; Cancer in her sister; Deep vein thrombosis in her father and mother; Diabetes in her father, paternal aunt, paternal grandmother, paternal uncle, and paternal uncle; Heart attack in her father; Heart attack (age of onset: 48) in her paternal grandfather; Heart disease in her father and paternal uncle; Hyperlipidemia in her father; Hypertension in her father, mother, and sister.   ROS:  Please see the history of present illness.   All other systems are personally reviewed and negative.   Exam:   BP  118/68   Pulse (!) 55   Wt 96.6 kg (213 lb)   SpO2 96%   BMI 38.96 kg/m  General: NAD Neck: No JVD, no thyromegaly or thyroid nodule.  Lungs: Clear to auscultation bilaterally with normal respiratory effort. CV: Nondisplaced PMI.  Heart regular S1/S2, no S3/S4, 2/6 SEM RUSB with clear S2.  No peripheral edema.  No carotid bruit.  Normal pedal pulses.  Abdomen: Soft, nontender, no hepatosplenomegaly, no distention.  Skin: Intact without lesions or rashes.  Neurologic: Alert and oriented x 3.  Psych: Normal affect. Extremities: No clubbing or cyanosis.  HEENT: Normal.   Recent Labs: 08/10/2020: Hemoglobin 14.0; Platelets 209 11/17/2020: B Natriuretic Peptide 86.6; BUN 30; Creatinine, Ser 1.38; Potassium 4.6; Sodium 140  Personally reviewed   Wt Readings from Last 3 Encounters:  11/17/20 96.6 kg (213 lb)  09/23/20 94.7 kg (208 lb 12.8 oz)  08/12/20 94.3 kg (208 lb)     ASSESSMENT AND PLAN:  1. CAD: Occluded native RCA and SVG-RCA.  Status post opening of CTO RCA with 4 overlapping Xience DES in 5/15.  DES to sequential SVG-OM1/PLOM in 9/19.  She has been on Plavix and Eliquis.  She has atypical chest pain, no change to pattern.  No ischemia on 1/21 Cardiolite.  - Continuing on Plavix (neuro has wanted her to stay on  this for cerebrovascular disease).   - She has not tolerated Imdur in the past. Off Toprol XL with bradycardia.  Continue ranolazine 1000 mg bid.  2. Chronic systolic => diastolic CHF: Echo in 3/84 with EF 55-60%, moderate diastolic dysfunction. Cardiomems placed 01/16/18.  Echo in 3/20 in setting of severe PNA with intubation showed EF 25-30%, mid-apical LV severe hypokinesis.  Possible stress (Takotsubo-type) cardiomyopathy related to severe medical illness versus progression of CAD.  She has baseline chronic LBBB which could play a role in cardiomyopathy as well (LBBB cardiomyopathy).  Repeat echo in 11/20 with EF up to 45-50%, moderately dysfunctional RV.  TEE in 12/20  with EF 50-55% with septal-lateral dyssynchrony and mildly dysfunctional RV.  Most recent echo in 5/22 with EF up to 60-65% with moderate LVH and normal RV.  NYHA class II-III symptoms, this seems more due to fatigue than dyspnea.  She is not volume overloaded by exam or Cardiomems. She has orthostatic symptoms, I wonder if her symptoms are not due to overdiuresis and low BP.    - Decrease torsemide to 10 mg daily.  BMET/BNP today.  - Continue spironolactone 25 mg daily.  - Continue Entresto 97/103 bid.  - Decrease amlodipine to 5 mg daily with orthostatic-type symptoms.    3. Hyperlipidemia: She remains on Zetia, Crestor and Praluent. Good lipids in 4/22.   4. Hypertension: Orthostatic symptoms, decreasing amlodipine as above.  5. Cerebrovascular disease: She has history of CVA and had left carotid stent in 2/15. Followed by [facility].  Most recent MRA head showed severe stenosis of supraclinoid RICA and severe basilar artery stenosis.  - Neurology would like her to continue Plavix for this despite concomitant apixaban use.  6. OHS/OSA: Cannot tolerate CPAP.  Uses oxygen at night.  7. Atrial fibrillation: Paroxysmal, no recent palpitations. 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  She is in NSR today.  - Continue apixaban 5 mg bid.  8. Obesity: Weight has trended down over the last year.  9. Mitral regurgitation: Mild-moderate on 5/22 echo. .  10. Bradycardia: Now off Toprol XL.   11. Aortic stenosis: Mild on 5/22 echo.  12. Mitral annular mass: This is a well-circumscribed calcified posterior mitral annulus mass noted on 5/22 echo.  This may be MAC but was significantly more prominent than in the past.   - Repeat echo in 11/22 to follow.  If no change, would continue to follow.   Followup in 1 month with APP.   Signed, Loralie Champagne, MD  11/18/2020  [place] [address] [facility] [place]  [phone] (office) [phone] (fax)

## 2020-11-20 ENCOUNTER — Ambulatory Visit: Payer: Medicare Other | Admitting: Physical Therapy

## 2020-11-20 ENCOUNTER — Encounter: Payer: Self-pay | Admitting: Physical Therapy

## 2020-11-20 ENCOUNTER — Other Ambulatory Visit: Payer: Self-pay

## 2020-11-20 DIAGNOSIS — M6281 Muscle weakness (generalized): Secondary | ICD-10-CM

## 2020-11-20 DIAGNOSIS — R2681 Unsteadiness on feet: Secondary | ICD-10-CM

## 2020-11-20 DIAGNOSIS — R5381 Other malaise: Secondary | ICD-10-CM

## 2020-11-20 DIAGNOSIS — R262 Difficulty in walking, not elsewhere classified: Secondary | ICD-10-CM

## 2020-11-20 NOTE — Therapy (Signed)
[address] [place], [place], [address] Phone: [phone]   Fax:  [phone]  Physical Therapy Treatment  Patient Details  Name: [name] [name] [name]: [id number] Date of Birth: 04-02-1945 Referring Provider (PT): Dr Loralie Champagne   Encounter Date: 11/20/2020   PT End of Session - 11/20/20 1139     Visit Number 13    Date for PT Re-Evaluation 12/19/20    PT Start Time 29    PT Stop Time 1143    PT Time Calculation (min) 43 min    Activity Tolerance Patient tolerated treatment well    Behavior During Therapy [facility] for tasks assessed/performed             Past Medical History:  Diagnosis Date   Anemia    hx   Anxiety    Asthma    Basal cell carcinoma 05/2014   "left shoulder"   Bundle branch block, left    chronic/notes 07/18/2013   CHF (congestive heart failure) ([place])    Chronic insomnia 05/06/2015   Chronic kidney disease    frequency, sees dr Jamal Maes every 4 to 6 months (01/16/2018)   Chronic lower back pain    Claustrophobia    Common migraine 05/14/2014   Coronary artery disease    MI in 2001, 2002, 2006, 2011, 2014   Depression    Diabetic peripheral neuropathy ([place]) 01/12/2019   GERD (gastroesophageal reflux disease)    H/O hiatal hernia    Headache    "at least 2/month" (01/16/2018)   Heart murmur    Hyperlipidemia    Hypertension    Memory change 05/14/2014   Migraine    "5-6/year"  (01/16/2018)   Obesity 01-2010   Obstructive sleep apnea    "can't wear machine; I have claustrophobia" (01/16/2018), states she had a 2nd sleep study and does not have sleep apnea, her O2 decreases and now is on 2 L of O2 at night.   On home oxygen therapy    "2L at night and prn during daytime" (01/16/2018)   Osteoarthritis    "knees and hands" (01/16/2018)   Peripheral vascular disease ([place])    ? numbness, tingling arms and legs   PONV (postoperative nausea and vomiting)    Stroke ([place])     2014, 2015, 2016   Type II diabetes mellitus ([place])    Ventral hernia    hx of    Past Surgical History:  Procedure Laterality Date   ANKLE FRACTURE SURGERY Left 1970's   APPENDECTOMY  1970's   w/hysterectomy   BASAL CELL CARCINOMA EXCISION Left 05/2014   "shoulder" (01/16/2018)   CARDIAC CATHETERIZATION  10/10/2012   Dr [name].   CARDIAC CATHETERIZATION N/A 05/29/2015   Procedure: Right/Left Heart Cath and Coronary/Graft Angiography;  Surgeon: Larey Dresser, MD;  Location: [place] CV LAB;  Service: Cardiovascular;  Laterality: N/A;   CAROTID ENDARTERECTOMY Left 03/2013   CAROTID STENT INSERTION Left 03/20/2013   Procedure: CAROTID STENT INSERTION;  Surgeon: [name], MD;  Location: [facility] CATH LAB;  Service: Cardiovascular;  Laterality: Left;  internal carotid   CEREBRAL ANGIOGRAM N/A 04/05/2011   Procedure: CEREBRAL ANGIOGRAM;  Surgeon: Angelia Mould, MD;  Location: [facility] CATH LAB;  Service: Cardiovascular;  Laterality: N/A;   CHOLECYSTECTOMY OPEN  2004   CORONARY ANGIOPLASTY WITH STENT PLACEMENT  01,02,05,06,07,08,11; 04/24/2013   "I've probably got ~ 10 stents by now" (04/24/2013)   CORONARY ANGIOPLASTY WITH STENT PLACEMENT  06/13/2013   "got 4 stents today" (06/13/2013)   CORONARY ARTERY BYPASS GRAFT  1220/11   "CABG X5"   CORONARY STENT INTERVENTION N/A 10/20/2017   Procedure: CORONARY STENT INTERVENTION;  Surgeon: Troy Sine, MD;  Location: [place] CV LAB;  Service: Cardiovascular;  Laterality: N/A;   ESOPHAGOGASTRODUODENOSCOPY  08/03/2011   Procedure: ESOPHAGOGASTRODUODENOSCOPY (EGD);  Surgeon: Shann Medal, MD;  [facility];  Service: General;  Laterality: N/A;   ESOPHAGOGASTRODUODENOSCOPY (EGD) WITH PROPOFOL N/A 03/11/2014   Procedure: ESOPHAGOGASTRODUODENOSCOPY (EGD) WITH PROPOFOL;  Surgeon: Lafayette Dragon, MD;  [facility]: [facility]OSCOPY;  Service: Endoscopy;  Laterality: N/A;   FRACTURE SURGERY     gall stone removal  05/2003   GASTRIC RESTRICTION  SURGERY  1984   "stapeling"   HERNIA REPAIR  2004   "in my stomach; had OR on it twice", wire mesh on 1 hernia   LEFT HEART CATH AND CORS/GRAFTS ANGIOGRAPHY N/A 07/09/2016   Procedure: LEFT HEART CATH AND CORS/GRAFTS ANGIOGRAPHY;  Surgeon: Larey Dresser, MD;  Location: [place] CV LAB;  Service: Cardiovascular;  Laterality: N/A;   ORIF ANKLE FRACTURE Left 05/16/2018   ORIF ANKLE FRACTURE Left 05/16/2018   Procedure: OPEN REDUCTION INTERNAL FIXATION (ORIF) Left ankle with possible syndesmosis fixation;  Surgeon: Nicholes Stairs, MD;  Location: [place];  Service: Orthopedics;  Laterality: Left;  170mn   OVARY SURGERY  1970's   "tumor removed"   PERCUTANEOUS CORONARY STENT INTERVENTION (PCI-S) N/A 06/13/2013   Procedure: PERCUTANEOUS CORONARY STENT INTERVENTION (PCI-S);  Surgeon: JJettie Booze MD;  [facility];  Service: Cardiovascular;  Laterality: N/A;   PERCUTANEOUS STENT INTERVENTION N/A 04/24/2013   Procedure: PERCUTANEOUS STENT INTERVENTION;  Surgeon: JJettie Booze MD;  [facility];  Service: Cardiovascular;  Laterality: N/A;   RIGHT/LEFT HEART CATH AND CORONARY/GRAFT ANGIOGRAPHY N/A 10/20/2017   Procedure: RIGHT/LEFT HEART CATH AND CORONARY/GRAFT ANGIOGRAPHY;  Surgeon: MLarey Dresser MD;  [facility];  Service: Cardiovascular;  Laterality: N/A;   ROOT CANAL  10/2000   TEE WITHOUT CARDIOVERSION N/A 01/18/2019   Procedure: TRANSESOPHAGEAL ECHOCARDIOGRAM (TEE);  Surgeon: MLarey Dresser MD;  [facility];  Service: Cardiovascular;  Laterality: N/A;   TIBIA FRACTURE SURGERY Right 1970's   rods and pins   TOOTH EXTRACTION     "1 on the upper; wisdom tooth on the lower" (01/16/2018)   TOTAL ABDOMINAL HYSTERECTOMY  1970's   w/ appendectomy    There were no vitals filed for this visit.   Subjective Assessment - 11/20/20 1103     Subjective Same old aches and pains, mo falls but had to catch herself once    Currently in  Pain? Yes    Pain Score 5     Pain Location Back                               [facility] Adult PT Treatment/Exercise - 11/20/20 0001       Knee/Hip Exercises: Aerobic   Nustep L3 x7 min LE only      Knee/Hip Exercises: Machines for Strengthening   Cybex Knee Extension 5# 2 sets 10    Cybex Knee Flexion 20# 2 sets 12      Knee/Hip Exercises: Standing   Forward Step Up Both;2 sets;5 sets;Step Height: 4"   w/ rollator   Other Standing Knee Exercises Standing march with rollator 2x5  Knee/Hip Exercises: Seated   Long Arc Quad Left;2 sets;15 reps    Long Arc Quad Weight 3 lbs.    Sit to Sand 2 sets;10 reps;without UE support   yellow ball                      PT Short Term Goals - 11/06/20 1153       PT SHORT TERM GOAL #1   Title Patient to be independent with initial HEP.    Status Achieved               PT Long Term Goals - 11/20/20 1142       PT LONG TERM GOAL #1   Title Patient to be independent with advanced HEP.    Status Partially Met      PT LONG TERM GOAL #2   Title Patient to demonstrate B LE strength >/=4+/5.    Status Partially Met      PT LONG TERM GOAL #3   Title Patient to score <14 sec on TUG testing in order to decrease risk of falls.    Status Partially Met                   Plan - 11/20/20 1139     Clinical Impression Statement Pt did well completing today's activities but did have increase fatigue. Added step up interventions with use of Rolator, this was very taxing on Pt. Therapist  aided in blocking L knee with standing marches to help with pt comfort level. Fatigue made sit to stands mote difficult with the later reps.    Personal Factors and Comorbidities Age;Comorbidity 2    Comorbidities CHF, Pneumonia    Examination-Activity Limitations Locomotion Level;Stairs    Examination-Participation Restrictions Occupation;Community Activity    Stability/Clinical Decision Making Evolving/Moderate  complexity    Rehab Potential Good    PT Frequency 2x / week    PT Duration 12 weeks    PT Treatment/Interventions ADLs/Self Care Home Management;Cryotherapy;Electrical Stimulation;Iontophoresis 56m/ml Dexamethasone;Moist Heat;Gait training;Stair training;Functional mobility training;Therapeutic activities;Therapeutic exercise;Balance training;Neuromuscular re-education;Patient/family education;Manual techniques;Passive range of motion;Dry needling;Taping;Vasopneumatic Device;Joint Manipulations;Spinal Manipulations    PT Next Visit Plan standing LE strengthening and balance exercise with focus on decreasing UE use             Patient will benefit from skilled therapeutic intervention in order to improve the following deficits and impairments:  Decreased balance, Difficulty walking, Decreased activity tolerance, Decreased strength, Pain  Visit Diagnosis: Muscle weakness (generalized)  Difficulty in walking, not elsewhere classified  Unsteadiness on feet  Debility     Problem List Patient Active Problem List   Diagnosis Date Noted   Symptomatic bradycardia 01/16/2019   Normocytic anemia 01/16/2019   Diabetic peripheral neuropathy ([place] 01/12/2019   Neurologic abnormality 12/25/2018   Debility 09/21/2018   Hypotension 09/18/2018   Bradycardia 09/18/2018   AKI (acute kidney injury) ([place] 09/18/2018   Pressure injury of skin 09/18/2018   Ischemic cerebrovascular accident (CVA) of frontal lobe ([place] 09/13/2018   Fall 09/09/2018   History of CVA (cerebrovascular accident) 09/09/2018   Type II diabetes mellitus with renal manifestations ([place]ri[place] 09/09/2018   CKD (chronic kidney disease), stage III 09/09/2018   Sepsis ([place] 09/09/2018   Depression with anxiety 09/09/2018   Slurred speech 09/09/2018   Ankle fracture, left 05/16/2018   Amiodarone pulmonary toxicity    Physical deconditioning    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[place]    Acute on chronic congestive heart failure (  [place])     Chronic combined systolic (congestive) and diastolic (congestive) heart failure (HCC) 38/17/7116   Chronic systolic CHF (congestive heart failure) ([place]) 01/16/2018   Atrial fibrillation ([place]) 10/21/2017   CAD (coronary artery disease) of bypass graft 10/20/2017   Coronary artery disease of bypass graft of native heart with stable angina pectoris (HCC)    Chronic low back pain 08/24/2016   Pain of left thumb 08/05/2016   Nocturnal hypoxia 06/02/2016   Hoarseness 04/05/2016   Laryngopharyngeal reflux (LPR) 04/05/2016   Pharyngoesophageal dysphagia 04/05/2016   Angina decubitus ([place]) 05/22/2015   Chronic insomnia 05/06/2015   Common migraine 05/14/2014   Abnormality of gait 05/14/2014   Memory change 05/14/2014   Aneurysm, cerebral, nonruptured 05/14/2014   Cramp of limb-Left neck 05/10/2014   Hematemesis with nausea    Vomiting blood    Dizziness and giddiness 09/21/2013   Atypical chest pain 07/18/2013   Unstable angina ([place]) 04/09/2013   Carotid artery stenosis, symptomatic 03/20/2013   Cerebrovascular disease 07/10/2012   Cerebral artery occlusion with cerebral infarction ([place]) 07/10/2012   Mitral regurgitation 04/15/2012   TIA (transient ischemic attack) 04/15/2012   Occlusion and stenosis of carotid artery without mention of cerebral infarction 08/18/2011   Bariatric surgery status 06/17/2011   Speech abnormality 03/22/2011   Dyspnea 02/24/2011   PAPILLARY MUSCLE DYSFUNCTION, NON-RHEUMATIC 10/09/2008   UNSPECIFIED VITAMIN D DEFICIENCY 10/24/2007   MYOCARDIAL INFARCTION, HX OF 10/24/2007   PERSISTENT VOMITING 10/24/2007   OSTEOARTHRITIS 10/24/2007   MIGRAINES, HX OF 10/24/2007   Diabetes mellitus type 2, controlled, with complications ([place]) 57/90/3833   Hyperlipemia 01/16/2007   Obesity-post failed open gastroplasty 1984  01/16/2007   OBSTRUCTIVE SLEEP APNEA 01/16/2007   Essential hypertension 01/16/2007   Coronary atherosclerosis 01/16/2007   Asthma 01/16/2007    GERD 01/16/2007   VENTRAL HERNIA 01/16/2007    Scot Jun, PTA 11/20/2020, 11:43 AM  [address] [place], [place], [address] Phone: [phone]   Fax:  [phone]  Name: [name] [name] [name]: [id number] Date of Birth: 1945-08-13

## 2020-11-21 ENCOUNTER — Encounter: Payer: Self-pay | Admitting: Physical Therapy

## 2020-11-21 ENCOUNTER — Ambulatory Visit: Payer: Medicare Other | Admitting: Physical Therapy

## 2020-11-21 DIAGNOSIS — R262 Difficulty in walking, not elsewhere classified: Secondary | ICD-10-CM

## 2020-11-21 DIAGNOSIS — M6281 Muscle weakness (generalized): Secondary | ICD-10-CM | POA: Diagnosis not present

## 2020-11-21 DIAGNOSIS — R2681 Unsteadiness on feet: Secondary | ICD-10-CM

## 2020-11-21 DIAGNOSIS — R5381 Other malaise: Secondary | ICD-10-CM

## 2020-11-21 NOTE — Therapy (Signed)
[address] [place], [place], [address] Phone: [phone]   Fax:  [phone]  Physical Therapy Treatment  Patient Details  Name: [name] [name] [name]: [id number] Date of Birth: [birth year] Referring Provider (PT): Dr Loralie Champagne   Encounter Date: 11/21/2020   PT End of Session - 11/21/20 0928     Visit Number 14    Date for PT Re-Evaluation 12/19/20    PT Start Time 0851    PT Stop Time 0926    PT Time Calculation (min) 35 min    Activity Tolerance Patient tolerated treatment well    Behavior During Therapy [facility] for tasks assessed/performed             Past Medical History:  Diagnosis Date   Anemia    hx   Anxiety    Asthma    Basal cell carcinoma 05/2014   "left shoulder"   Bundle branch block, left    chronic/notes 07/18/2013   CHF (congestive heart failure) ([place])    Chronic insomnia 05/06/2015   Chronic kidney disease    frequency, sees dr Jamal Maes every 4 to 6 months (01/16/2018)   Chronic lower back pain    Claustrophobia    Common migraine 05/14/2014   Coronary artery disease    MI in 2001, 2002, 2006, 2011, 2014   Depression    Diabetic peripheral neuropathy ([place]) 01/12/2019   GERD (gastroesophageal reflux disease)    H/O hiatal hernia    Headache    "at least 2/month" (01/16/2018)   Heart murmur    Hyperlipidemia    Hypertension    Memory change 05/14/2014   Migraine    "5-6/year"  (01/16/2018)   Obesity 01-2010   Obstructive sleep apnea    "can't wear machine; I have claustrophobia" (01/16/2018), states she had a 2nd sleep study and does not have sleep apnea, her O2 decreases and now is on 2 L of O2 at night.   On home oxygen therapy    "2L at night and prn during daytime" (01/16/2018)   Osteoarthritis    "knees and hands" (01/16/2018)   Peripheral vascular disease ([place])    ? numbness, tingling arms and legs   PONV (postoperative nausea and vomiting)    Stroke ([place])     2014, 2015, 2016   Type II diabetes mellitus ([place])    Ventral hernia    hx of    Past Surgical History:  Procedure Laterality Date   ANKLE FRACTURE SURGERY Left 1970's   APPENDECTOMY  1970's   w/hysterectomy   BASAL CELL CARCINOMA EXCISION Left 05/2014   "shoulder" (01/16/2018)   CARDIAC CATHETERIZATION  10/10/2012   Dr [name].   CARDIAC CATHETERIZATION N/A 05/29/2015   Procedure: Right/Left Heart Cath and Coronary/Graft Angiography;  Surgeon: Larey Dresser, MD;  Location: [place] CV LAB;  Service: Cardiovascular;  Laterality: N/A;   CAROTID ENDARTERECTOMY Left 03/2013   CAROTID STENT INSERTION Left 03/20/2013   Procedure: CAROTID STENT INSERTION;  Surgeon: [name], MD;  Location: [facility] CATH LAB;  Service: Cardiovascular;  Laterality: Left;  internal carotid   CEREBRAL ANGIOGRAM N/A 04/05/2011   Procedure: CEREBRAL ANGIOGRAM;  Surgeon: Angelia Mould, MD;  Location: [facility] CATH LAB;  Service: Cardiovascular;  Laterality: N/A;   CHOLECYSTECTOMY OPEN  2004   CORONARY ANGIOPLASTY WITH STENT PLACEMENT  01,02,05,06,07,08,11; 04/24/2013   "I've probably got ~ 10 stents by now" (04/24/2013)   CORONARY ANGIOPLASTY WITH STENT PLACEMENT  06/13/2013   "got 4 stents today" (06/13/2013)   CORONARY ARTERY BYPASS GRAFT  1220/11   "CABG X5"   CORONARY STENT INTERVENTION N/A 10/20/2017   Procedure: CORONARY STENT INTERVENTION;  Surgeon: Troy Sine, MD;  Location: [place] CV LAB;  Service: Cardiovascular;  Laterality: N/A;   ESOPHAGOGASTRODUODENOSCOPY  08/03/2011   Procedure: ESOPHAGOGASTRODUODENOSCOPY (EGD);  Surgeon: Shann Medal, MD;  [facility];  Service: General;  Laterality: N/A;   ESOPHAGOGASTRODUODENOSCOPY (EGD) WITH PROPOFOL N/A 03/11/2014   Procedure: ESOPHAGOGASTRODUODENOSCOPY (EGD) WITH PROPOFOL;  Surgeon: Lafayette Dragon, MD;  [facility];  Service: Endoscopy;  Laterality: N/A;   FRACTURE SURGERY     gall stone removal  05/2003   GASTRIC RESTRICTION  SURGERY  1984   "stapeling"   HERNIA REPAIR  2004   "in my stomach; had OR on it twice", wire mesh on 1 hernia   LEFT HEART CATH AND CORS/GRAFTS ANGIOGRAPHY N/A 07/09/2016   Procedure: LEFT HEART CATH AND CORS/GRAFTS ANGIOGRAPHY;  Surgeon: Larey Dresser, MD;  Location: [place] CV LAB;  Service: Cardiovascular;  Laterality: N/A;   ORIF ANKLE FRACTURE Left 05/16/2018   ORIF ANKLE FRACTURE Left 05/16/2018   Procedure: OPEN REDUCTION INTERNAL FIXATION (ORIF) Left ankle with possible syndesmosis fixation;  Surgeon: Nicholes Stairs, MD;  Location: [place];  Service: Orthopedics;  Laterality: Left;  159mn   OVARY SURGERY  1970's   "tumor removed"   PERCUTANEOUS CORONARY STENT INTERVENTION (PCI-S) N/A 06/13/2013   Procedure: PERCUTANEOUS CORONARY STENT INTERVENTION (PCI-S);  Surgeon: JJettie Booze MD;  [facility];  Service: Cardiovascular;  Laterality: N/A;   PERCUTANEOUS STENT INTERVENTION N/A 04/24/2013   Procedure: PERCUTANEOUS STENT INTERVENTION;  Surgeon: JJettie Booze MD;  [facility];  Service: Cardiovascular;  Laterality: N/A;   RIGHT/LEFT HEART CATH AND CORONARY/GRAFT ANGIOGRAPHY N/A 10/20/2017   Procedure: RIGHT/LEFT HEART CATH AND CORONARY/GRAFT ANGIOGRAPHY;  Surgeon: MLarey Dresser MD;  [place] [place];  Service: Cardiovascular;  Laterality: N/A;   ROOT CANAL  10/2000   TEE WITHOUT CARDIOVERSION N/A 01/18/2019   Procedure: TRANSESOPHAGEAL ECHOCARDIOGRAM (TEE);  Surgeon: MLarey Dresser MD;  [facility];  Service: Cardiovascular;  Laterality: N/A;   TIBIA FRACTURE SURGERY Right 1970's   rods and pins   TOOTH EXTRACTION     "1 on the upper; wisdom tooth on the lower" (01/16/2018)   TOTAL ABDOMINAL HYSTERECTOMY  1970's   w/ appendectomy    There were no vitals filed for this visit.   Subjective Assessment - 11/21/20 0852     Subjective "Im sore from yesterday"    Currently in Pain? Yes    Pain Score 6     Pain  Location Back                               [facility] Adult PT Treatment/Exercise - 11/21/20 0001       Knee/Hip Exercises: Aerobic   Nustep L5 x7 min      Knee/Hip Exercises: Machines for Strengthening   Cybex Knee Extension 10# 2 sets 10    Cybex Knee Flexion 25# 2 sets 10      Knee/Hip Exercises: Seated   Long Arc Quad Left;2 sets;10 reps    Long Arc Quad Weight 5 lbs.    Hamstring Curl Strengthening;Left;2 sets;15 reps    Hamstring Limitations gren tband    Sit to Sand 2 sets;10  reps;without UE support   holding yellow ball                      PT Short Term Goals - 11/06/20 1153       PT SHORT TERM GOAL #1   Title Patient to be independent with initial HEP.    Status Achieved               PT Long Term Goals - 11/20/20 1142       PT LONG TERM GOAL #1   Title Patient to be independent with advanced HEP.    Status Partially Met      PT LONG TERM GOAL #2   Title Patient to demonstrate B LE strength >/=4+/5.    Status Partially Met      PT LONG TERM GOAL #3   Title Patient to score <14 sec on TUG testing in order to decrease risk of falls.    Status Partially Met                   Plan - 11/21/20 0929     Clinical Impression Statement Pt enters clinic reporting some soreness from yesterdays therapy session. Standing interventions kept to a minium. Increase resistance tolerated with both leg curls and extensions. Fatigue noted during the second set of sit to stands, pt reports that her L knee felt like it was going to go out requiring rest. No reports of pain only fatigue.    Personal Factors and Comorbidities Age;Comorbidity 2    Comorbidities CHF, Pneumonia    Examination-Activity Limitations Locomotion Level;Stairs    Examination-Participation Restrictions Occupation;Community Activity    Stability/Clinical Decision Making Evolving/Moderate complexity    Rehab Potential Good    PT Frequency 2x / week    PT  Duration 12 weeks    PT Treatment/Interventions ADLs/Self Care Home Management;Cryotherapy;Electrical Stimulation;Iontophoresis 77m/ml Dexamethasone;Moist Heat;Gait training;Stair training;Functional mobility training;Therapeutic activities;Therapeutic exercise;Balance training;Neuromuscular re-education;Patient/family education;Manual techniques;Passive range of motion;Dry needling;Taping;Vasopneumatic Device;Joint Manipulations;Spinal Manipulations    PT Next Visit Plan standing LE strengthening and balance exercise with focus on decreasing UE use             Patient will benefit from skilled therapeutic intervention in order to improve the following deficits and impairments:  Decreased balance, Difficulty walking, Decreased activity tolerance, Decreased strength, Pain  Visit Diagnosis: Difficulty in walking, not elsewhere classified  Unsteadiness on feet  Debility  Muscle weakness (generalized)     Problem List Patient Active Problem List   Diagnosis Date Noted   Symptomatic bradycardia 01/16/2019   Normocytic anemia 01/16/2019   Diabetic peripheral neuropathy (HFife 01/12/2019   Neurologic abnormality 12/25/2018   Debility 09/21/2018   Hypotension 09/18/2018   Bradycardia 09/18/2018   AKI (acute kidney injury) ([place] 09/18/2018   Pressure injury of skin 09/18/2018   Ischemic cerebrovascular accident (CVA) of frontal lobe (HL[place] 09/13/2018   Fall 09/09/2018   History of CVA (cerebrovascular accident) 09/09/2018   Type II diabetes mellitus with renal manifestations (HH[place] 09/09/2018   CKD (chronic kidney disease), stage III 09/09/2018   Sepsis (HMar[place] 09/09/2018   Depression with anxiety 09/09/2018   Slurred speech 09/09/2018   Ankle fracture, left 05/16/2018   Amiodarone pulmonary toxicity    Physical deconditioning    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HSouth [place]    Acute on chronic congestive heart failure (HOld [place]    Chronic combined systolic (congestive) and diastolic (congestive)  heart failure (HSumm[place] 103/00/9233  Chronic systolic CHF (congestive  heart failure) ([place]) 01/16/2018   Atrial fibrillation ([place]) 10/21/2017   CAD (coronary artery disease) of bypass graft 10/20/2017   Coronary artery disease of bypass graft of native heart with stable angina pectoris (HCC)    Chronic low back pain 08/24/2016   Pain of left thumb 08/05/2016   Nocturnal hypoxia 06/02/2016   Hoarseness 04/05/2016   Laryngopharyngeal reflux (LPR) 04/05/2016   Pharyngoesophageal dysphagia 04/05/2016   Angina decubitus ([place]) 05/22/2015   Chronic insomnia 05/06/2015   Common migraine 05/14/2014   Abnormality of gait 05/14/2014   Memory change 05/14/2014   Aneurysm, cerebral, nonruptured 05/14/2014   Cramp of limb-Left neck 05/10/2014   Hematemesis with nausea    Vomiting blood    Dizziness and giddiness 09/21/2013   Atypical chest pain 07/18/2013   Unstable angina ([place]) 04/09/2013   Carotid artery stenosis, symptomatic 03/20/2013   Cerebrovascular disease 07/10/2012   Cerebral artery occlusion with cerebral infarction ([place]) 07/10/2012   Mitral regurgitation 04/15/2012   TIA (transient ischemic attack) 04/15/2012   Occlusion and stenosis of carotid artery without mention of cerebral infarction 08/18/2011   Bariatric surgery status 06/17/2011   Speech abnormality 03/22/2011   Dyspnea 02/24/2011   PAPILLARY MUSCLE DYSFUNCTION, NON-RHEUMATIC 10/09/2008   UNSPECIFIED VITAMIN D DEFICIENCY 10/24/2007   MYOCARDIAL INFARCTION, HX OF 10/24/2007   PERSISTENT VOMITING 10/24/2007   OSTEOARTHRITIS 10/24/2007   MIGRAINES, HX OF 10/24/2007   Diabetes mellitus type 2, controlled, with complications ([place]) 62/83/6629   Hyperlipemia 01/16/2007   Obesity-post failed open gastroplasty 1984  01/16/2007   OBSTRUCTIVE SLEEP APNEA 01/16/2007   Essential hypertension 01/16/2007   Coronary atherosclerosis 01/16/2007   Asthma 01/16/2007   GERD 01/16/2007   VENTRAL HERNIA 01/16/2007    Scot Jun,  PTA 11/21/2020, 9:36 AM  [address] [place], [place], [address] Phone: [phone]   Fax:  [phone]  Name: [name] [name] [name]: [id number] Date of Birth: 1945/04/06

## 2020-11-25 ENCOUNTER — Other Ambulatory Visit: Payer: Self-pay

## 2020-11-25 ENCOUNTER — Ambulatory Visit: Payer: Medicare Other | Admitting: Physical Therapy

## 2020-11-25 DIAGNOSIS — M6281 Muscle weakness (generalized): Secondary | ICD-10-CM | POA: Diagnosis not present

## 2020-11-25 DIAGNOSIS — R262 Difficulty in walking, not elsewhere classified: Secondary | ICD-10-CM

## 2020-11-25 DIAGNOSIS — R5381 Other malaise: Secondary | ICD-10-CM

## 2020-11-25 DIAGNOSIS — R2681 Unsteadiness on feet: Secondary | ICD-10-CM

## 2020-11-25 NOTE — Therapy (Signed)
[place] [place], [place], [address] Phone: [phone]   Fax:  [phone]  Physical Therapy Treatment  Patient Details  Name: [name] [name] [name]: [id number] Date of Birth: 09-06-1945 Referring Provider (PT): Dr Loralie Champagne   Encounter Date: 11/25/2020   PT End of Session - 11/25/20 1139     Visit Number 15    Date for PT Re-Evaluation 12/19/20    PT Start Time 1050    PT Stop Time 6962    PT Time Calculation (min) 55 min             Past Medical History:  Diagnosis Date   Anemia    hx   Anxiety    Asthma    Basal cell carcinoma 05/2014   "left shoulder"   Bundle branch block, left    chronic/notes 07/18/2013   CHF (congestive heart failure) ([place])    Chronic insomnia 05/06/2015   Chronic kidney disease    frequency, sees dr Jamal Maes every 4 to 6 months (01/16/2018)   Chronic lower back pain    Claustrophobia    Common migraine 05/14/2014   Coronary artery disease    MI in 2001, 2002, 2006, 2011, 2014   Depression    Diabetic peripheral neuropathy ([place]) 01/12/2019   GERD (gastroesophageal reflux disease)    H/O hiatal hernia    Headache    "at least 2/month" (01/16/2018)   Heart murmur    Hyperlipidemia    Hypertension    Memory change 05/14/2014   Migraine    "5-6/year"  (01/16/2018)   Obesity 01-2010   Obstructive sleep apnea    "can't wear machine; I have claustrophobia" (01/16/2018), states she had a 2nd sleep study and does not have sleep apnea, her O2 decreases and now is on 2 L of O2 at night.   On home oxygen therapy    "2L at night and prn during daytime" (01/16/2018)   Osteoarthritis    "knees and hands" (01/16/2018)   Peripheral vascular disease ([place])    ? numbness, tingling arms and legs   PONV (postoperative nausea and vomiting)    Stroke ([place])    2014, 2015, 2016   Type II diabetes mellitus ([place])    Ventral hernia    hx of    Past Surgical History:   Procedure Laterality Date   ANKLE FRACTURE SURGERY Left 1970's   APPENDECTOMY  1970's   w/hysterectomy   BASAL CELL CARCINOMA EXCISION Left 05/2014   "shoulder" (01/16/2018)   CARDIAC CATHETERIZATION  10/10/2012   Dr [name].   CARDIAC CATHETERIZATION N/A 05/29/2015   Procedure: Right/Left Heart Cath and Coronary/Graft Angiography;  Surgeon: Larey Dresser, MD;  Location: [place] [facility];  Service: Cardiovascular;  Laterality: N/A;   CAROTID ENDARTERECTOMY Left 03/2013   CAROTID STENT INSERTION Left 03/20/2013   Procedure: CAROTID STENT INSERTION;  Surgeon: [name], MD;  Location[facility];  Service: Cardiovascular;  Laterality: Left;  internal carotid   CEREBRAL ANGIOGRAM N/A 04/05/2011   Procedure: CEREBRAL ANGIOGRAM;  Surgeon: Angelia Mould, MD;  Location: [facility] CATH LAB;  Service: Cardiovascular;  Laterality: N/A;   CHOLECYSTECTOMY OPEN  2004   CORONARY ANGIOPLASTY WITH STENT PLACEMENT  01,02,05,06,07,08,11; 04/24/2013   "I've probably got ~ 10 stents by now" (04/24/2013)   CORONARY ANGIOPLASTY WITH STENT PLACEMENT  06/13/2013   "got 4 stents today" (06/13/2013)   CORONARY ARTERY BYPASS GRAFT  1220/11   "CABG  X5"   CORONARY STENT INTERVENTION N/A 10/20/2017   Procedure: CORONARY STENT INTERVENTION;  Surgeon: Troy Sine, MD;  Location: [place] CV LAB;  Service: Cardiovascular;  Laterality: N/A;   ESOPHAGOGASTRODUODENOSCOPY  08/03/2011   Procedure: ESOPHAGOGASTRODUODENOSCOPY (EGD);  Surgeon: Shann Medal, MD;  [facility];  Service: General;  Laterality: N/A;   ESOPHAGOGASTRODUODENOSCOPY (EGD) WITH PROPOFOL N/A 03/11/2014   Procedure: ESOPHAGOGASTRODUODENOSCOPY (EGD) WITH PROPOFOL;  Surgeon: Lafayette Dragon, MD;  [facility]: [facility];  Service: Endoscopy;  Laterality: N/A;   FRACTURE SURGERY     gall stone removal  05/2003   GASTRIC RESTRICTION SURGERY  1984   "stapeling"   HERNIA REPAIR  2004   "in my stomach; had OR on it twice", wire mesh on 1 hernia    LEFT HEART CATH AND CORS/GRAFTS ANGIOGRAPHY N/A 07/09/2016   Procedure: LEFT HEART CATH AND CORS/GRAFTS ANGIOGRAPHY;  Surgeon: Larey Dresser, MD;  Location: [place] CV LAB;  Service: Cardiovascular;  Laterality: N/A;   ORIF ANKLE FRACTURE Left 05/16/2018   ORIF ANKLE FRACTURE Left 05/16/2018   Procedure: OPEN REDUCTION INTERNAL FIXATION (ORIF) Left ankle with possible syndesmosis fixation;  Surgeon: Nicholes Stairs, MD;  Location: [place];  Service: Orthopedics;  Laterality: Left;  148mn   OVARY SURGERY  1970's   "tumor removed"   PERCUTANEOUS CORONARY STENT INTERVENTION (PCI-S) N/A 06/13/2013   Procedure: PERCUTANEOUS CORONARY STENT INTERVENTION (PCI-S);  Surgeon: JJettie Booze MD;  [facility];  Service: Cardiovascular;  Laterality: N/A;   PERCUTANEOUS STENT INTERVENTION N/A 04/24/2013   Procedure: PERCUTANEOUS STENT INTERVENTION;  Surgeon: JJettie Booze MD;  [facility];  Service: Cardiovascular;  Laterality: N/A;   RIGHT/LEFT HEART CATH AND CORONARY/GRAFT ANGIOGRAPHY N/A 10/20/2017   Procedure: RIGHT/LEFT HEART CATH AND CORONARY/GRAFT ANGIOGRAPHY;  Surgeon: MLarey Dresser MD;  [facility];  Service: Cardiovascular;  Laterality: N/A;   ROOT CANAL  10/2000   TEE WITHOUT CARDIOVERSION N/A 01/18/2019   Procedure: TRANSESOPHAGEAL ECHOCARDIOGRAM (TEE);  Surgeon: MLarey Dresser MD;  [facility];  Service: Cardiovascular;  Laterality: N/A;   TIBIA FRACTURE SURGERY Right 1970's   rods and pins   TOOTH EXTRACTION     "1 on the upper; wisdom tooth on the lower" (01/16/2018)   TOTAL ABDOMINAL HYSTERECTOMY  1970's   w/ appendectomy    There were no vitals filed for this visit.   Subjective Assessment - 11/25/20 1057     Subjective just not feeling great - no energy. pt moving very slow. pt states 1 fall since last visit - fell in bathroom getting out of shower, hit RT hip. saw cardio MD and sending me for more test- worried  about a heart value    Currently in Pain? Yes    Pain Score 6     Pain Location Back                               [facility] Adult PT Treatment/Exercise - 11/25/20 0001       Lumbar Exercises: Machines for Strengthening   Cybex Lumbar Extension black tband 2 sets 10    Other Lumbar Machine Exercise 20# seated row and lat 2 sets 10      Knee/Hip Exercises: Aerobic   Nustep L5 x7 min      Knee/Hip Exercises: Machines for Strengthening   Cybex Knee Extension 10# 2 sets 10  Cybex Knee Flexion 25# 2 sets 10      Knee/Hip Exercises: Seated   Long Arc Quad Strengthening;Both;2 sets;10 reps;Weights    Long Arc Quad Weight 5 lbs.    Ball Squeeze 20    Clamshell with TheraBand Blue   10 x , SL 10 each   Marching Strengthening;Both;2 sets;10 reps    Marching Weights 5 lbs.    Hamstring Curl Strengthening;Both;2 sets;10 reps    Hamstring Limitations blue tband    Abduction/Adduction  Strengthening;Both;2 sets;10 reps    Abd/Adduction Weights 5 lbs.                       PT Short Term Goals - 11/06/20 1153       PT SHORT TERM GOAL #1   Title Patient to be independent with initial HEP.    Status Achieved               PT Long Term Goals - 11/20/20 1142       PT LONG TERM GOAL #1   Title Patient to be independent with advanced HEP.    Status Partially Met      PT LONG TERM GOAL #2   Title Patient to demonstrate B LE strength >/=4+/5.    Status Partially Met      PT LONG TERM GOAL #3   Title Patient to score <14 sec on TUG testing in order to decrease risk of falls.    Status Partially Met                   Plan - 11/25/20 1139     Clinical Impression Statement pt enters clinic states not feeling great- no energy but wanting to try. pt tolerated ther ex well but slowly with rest as needed. no ex donw in standing today.pt has had another fall, 1 since last session    PT Treatment/Interventions ADLs/Self Care Home  Management;Cryotherapy;Electrical Stimulation;Iontophoresis 77m/ml Dexamethasone;Moist Heat;Gait training;Stair training;Functional mobility training;Therapeutic activities;Therapeutic exercise;Balance training;Neuromuscular re-education;Patient/family education;Manual techniques;Passive range of motion;Dry needling;Taping;Vasopneumatic Device;Joint Manipulations;Spinal Manipulations    PT Next Visit Plan check goals and progress as tolerated             Patient will benefit from skilled therapeutic intervention in order to improve the following deficits and impairments:  Decreased balance, Difficulty walking, Decreased activity tolerance, Decreased strength, Pain  Visit Diagnosis: Difficulty in walking, not elsewhere classified  Unsteadiness on feet  Debility  Muscle weakness (generalized)     Problem List Patient Active Problem List   Diagnosis Date Noted   Symptomatic bradycardia 01/16/2019   Normocytic anemia 01/16/2019   Diabetic peripheral neuropathy ([place] 01/12/2019   Neurologic abnormality 12/25/2018   Debility 09/21/2018   Hypotension 09/18/2018   Bradycardia 09/18/2018   AKI (acute kidney injury) ([place] 09/18/2018   Pressure injury of skin 09/18/2018   Ischemic cerebrovascular accident (CVA) of frontal lobe ([place] 09/13/2018   Fall 09/09/2018   History of CVA (cerebrovascular accident) 09/09/2018   Type II diabetes mellitus with renal manifestations ([place] 09/09/2018   CKD (chronic kidney disease), stage III 09/09/2018   Sepsis ([place] 09/09/2018   Depression with anxiety 09/09/2018   Slurred speech 09/09/2018   Ankle fracture, left 05/16/2018   Amiodarone pulmonary toxicity    Physical deconditioning    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[place]    Acute on chronic congestive heart failure ([place]    Chronic combined systolic (congestive) and diastolic (congestive) heart failure ([place] 01/16/2018  Chronic systolic CHF (congestive heart failure) ([place]) 01/16/2018   Atrial  fibrillation ([place]) 10/21/2017   CAD (coronary artery disease) of bypass graft 10/20/2017   Coronary artery disease of bypass graft of native heart with stable angina pectoris (HCC)    Chronic low back pain 08/24/2016   Pain of left thumb 08/05/2016   Nocturnal hypoxia 06/02/2016   Hoarseness 04/05/2016   Laryngopharyngeal reflux (LPR) 04/05/2016   Pharyngoesophageal dysphagia 04/05/2016   Angina decubitus ([place]) 05/22/2015   Chronic insomnia 05/06/2015   Common migraine 05/14/2014   Abnormality of gait 05/14/2014   Memory change 05/14/2014   Aneurysm, cerebral, nonruptured 05/14/2014   Cramp of limb-Left neck 05/10/2014   Hematemesis with nausea    Vomiting blood    Dizziness and giddiness 09/21/2013   Atypical chest pain 07/18/2013   Unstable angina ([place]) 04/09/2013   Carotid artery stenosis, symptomatic 03/20/2013   Cerebrovascular disease 07/10/2012   Cerebral artery occlusion with cerebral infarction ([place]) 07/10/2012   Mitral regurgitation 04/15/2012   TIA (transient ischemic attack) 04/15/2012   Occlusion and stenosis of carotid artery without mention of cerebral infarction 08/18/2011   Bariatric surgery status 06/17/2011   Speech abnormality 03/22/2011   Dyspnea 02/24/2011   PAPILLARY MUSCLE DYSFUNCTION, NON-RHEUMATIC 10/09/2008   UNSPECIFIED VITAMIN D DEFICIENCY 10/24/2007   MYOCARDIAL INFARCTION, HX OF 10/24/2007   PERSISTENT VOMITING 10/24/2007   OSTEOARTHRITIS 10/24/2007   MIGRAINES, HX OF 10/24/2007   Diabetes mellitus type 2, controlled, with complications ([place]) 72/55/0016   Hyperlipemia 01/16/2007   Obesity-post failed open gastroplasty 1984  01/16/2007   OBSTRUCTIVE SLEEP APNEA 01/16/2007   Essential hypertension 01/16/2007   Coronary atherosclerosis 01/16/2007   Asthma 01/16/2007   GERD 01/16/2007   VENTRAL HERNIA 01/16/2007    Nikitha Mode,ANGIE, PTA 11/25/2020, 11:41 AM  [place] [place], [place], [address] Phone: [phone]   Fax:  [phone]  Name: [name] [name] [name]: [id number] Date of Birth: 08-Sep-1945

## 2020-11-27 ENCOUNTER — Ambulatory Visit: Payer: Medicare Other | Admitting: Physical Therapy

## 2020-12-01 ENCOUNTER — Ambulatory Visit: Payer: Medicare Other | Admitting: Adult Health

## 2020-12-02 ENCOUNTER — Ambulatory Visit: Payer: Medicare Other | Admitting: Physical Therapy

## 2020-12-04 ENCOUNTER — Ambulatory Visit: Payer: Medicare Other | Admitting: Physical Therapy

## 2020-12-09 ENCOUNTER — Other Ambulatory Visit: Payer: Self-pay

## 2020-12-09 ENCOUNTER — Encounter: Payer: Self-pay | Admitting: Physical Therapy

## 2020-12-09 ENCOUNTER — Ambulatory Visit: Payer: Medicare Other | Attending: Cardiology | Admitting: Physical Therapy

## 2020-12-09 DIAGNOSIS — R5381 Other malaise: Secondary | ICD-10-CM | POA: Insufficient documentation

## 2020-12-09 DIAGNOSIS — R2681 Unsteadiness on feet: Secondary | ICD-10-CM | POA: Insufficient documentation

## 2020-12-09 DIAGNOSIS — M6281 Muscle weakness (generalized): Secondary | ICD-10-CM | POA: Diagnosis present

## 2020-12-09 DIAGNOSIS — R262 Difficulty in walking, not elsewhere classified: Secondary | ICD-10-CM | POA: Diagnosis present

## 2020-12-09 NOTE — Therapy (Signed)
[place] [place], [place], [address] Phone: [phone]   Fax:  [phone]  Physical Therapy Treatment  Patient Details  Name: [name] [name] [name]: [id number] Date of Birth: 1945/11/03 Referring Provider (PT): Dr Loralie Champagne   Encounter Date: 12/09/2020   PT End of Session - 12/09/20 1341     Visit Number 16    Date for PT Re-Evaluation 12/19/20    PT Start Time 1259    PT Stop Time 1341    PT Time Calculation (min) 42 min    Activity Tolerance Patient tolerated treatment well    Behavior During Therapy [facility] for tasks assessed/performed             Past Medical History:  Diagnosis Date   Anemia    hx   Anxiety    Asthma    Basal cell carcinoma 05/2014   "left shoulder"   Bundle branch block, left    chronic/notes 07/18/2013   CHF (congestive heart failure) ([place])    Chronic insomnia 05/06/2015   Chronic kidney disease    frequency, sees dr Jamal Maes every 4 to 6 months (01/16/2018)   Chronic lower back pain    Claustrophobia    Common migraine 05/14/2014   Coronary artery disease    MI in 2001, 2002, 2006, 2011, 2014   Depression    Diabetic peripheral neuropathy ([place]) 01/12/2019   GERD (gastroesophageal reflux disease)    H/O hiatal hernia    Headache    "at least 2/month" (01/16/2018)   Heart murmur    Hyperlipidemia    Hypertension    Memory change 05/14/2014   Migraine    "5-6/year"  (01/16/2018)   Obesity 01-2010   Obstructive sleep apnea    "can't wear machine; I have claustrophobia" (01/16/2018), states she had a 2nd sleep study and does not have sleep apnea, her O2 decreases and now is on 2 L of O2 at night.   On home oxygen therapy    "2L at night and prn during daytime" (01/16/2018)   Osteoarthritis    "knees and hands" (01/16/2018)   Peripheral vascular disease ([place])    ? numbness, tingling arms and legs   PONV (postoperative nausea and vomiting)    Stroke ([place])     2014, 2015, 2016   Type II diabetes mellitus ([place])    Ventral hernia    hx of    Past Surgical History:  Procedure Laterality Date   ANKLE FRACTURE SURGERY Left 1970's   APPENDECTOMY  1970's   w/hysterectomy   BASAL CELL CARCINOMA EXCISION Left 05/2014   "shoulder" (01/16/2018)   CARDIAC CATHETERIZATION  10/10/2012   Dr [name].   CARDIAC CATHETERIZATION N/A 05/29/2015   Procedure: Right/Left Heart Cath and Coronary/Graft Angiography;  Surgeon: Larey Dresser, MD;  Location: [place] [facility] LAB;  Service: Cardiovascular;  Laterality: N/A;   CAROTID ENDARTERECTOMY Left 03/2013   CAROTID STENT INSERTION Left 03/20/2013   Procedure: CAROTID STENT INSERTION;  Surgeon: [name], MD;  [facility] C[facility] LAB;  Service: Cardiovascular;  Laterality: Left;  internal carotid   CEREBRAL ANGIOGRAM N/A 04/05/2011   Procedure: CEREBRAL ANGIOGRAM;  Surgeon: Angelia Mould, MD;  Location: [facility] CATH LAB;  Service: Cardiovascular;  Laterality: N/A;   CHOLECYSTECTOMY OPEN  2004   CORONARY ANGIOPLASTY WITH STENT PLACEMENT  01,02,05,06,07,08,11; 04/24/2013   "I've probably got ~ 10 stents by now" (04/24/2013)   CORONARY ANGIOPLASTY WITH STENT PLACEMENT  06/13/2013   "got 4 stents today" (06/13/2013)   CORONARY ARTERY BYPASS GRAFT  1220/11   "CABG X5"   CORONARY STENT INTERVENTION N/A 10/20/2017   Procedure: CORONARY STENT INTERVENTION;  Surgeon: Troy Sine, MD;  Location: [place] CV LAB;  Service: Cardiovascular;  Laterality: N/A;   ESOPHAGOGASTRODUODENOSCOPY  08/03/2011   Procedure: ESOPHAGOGASTRODUODENOSCOPY (EGD);  Surgeon: Shann Medal, MD;  [facility];  Service: General;  Laterality: N/A;   ESOPHAGOGASTRODUODENOSCOPY (EGD) WITH PROPOFOL N/A 03/11/2014   Procedure: ESOPHAGOGASTRODUODENOSCOPY (EGD) WITH PROPOFOL;  Surgeon: Lafayette Dragon, MD;  [facility];  Service: Endoscopy;  Laterality: N/A;   FRACTURE SURGERY     gall stone removal  05/2003   GASTRIC RESTRICTION  SURGERY  1984   "stapeling"   HERNIA REPAIR  2004   "in my stomach; had OR on it twice", wire mesh on 1 hernia   LEFT HEART CATH AND CORS/GRAFTS ANGIOGRAPHY N/A 07/09/2016   Procedure: LEFT HEART CATH AND CORS/GRAFTS ANGIOGRAPHY;  Surgeon: Larey Dresser, MD;  Location: [place] [facility] LAB;  Service: Cardiovascular;  Laterality: N/A;   ORIF ANKLE FRACTURE Left 05/16/2018   ORIF ANKLE FRACTURE Left 05/16/2018   Procedure: OPEN REDUCTION INTERNAL FIXATION (ORIF) Left ankle with possible syndesmosis fixation;  Surgeon: Nicholes Stairs, MD;  Location: [place];  Service: Orthopedics;  Laterality: Left;  140mn   OVARY SURGERY  1970's   "tumor removed"   PERCUTANEOUS CORONARY STENT INTERVENTION (PCI-S) N/A 06/13/2013   Procedure: PERCUTANEOUS CORONARY STENT INTERVENTION (PCI-S);  Surgeon: JJettie Booze MD;  [facility];  Service: Cardiovascular;  Laterality: N/A;   PERCUTANEOUS STENT INTERVENTION N/A 04/24/2013   Procedure: PERCUTANEOUS STENT INTERVENTION;  Surgeon: JJettie Booze MD;  [facility];  Service: Cardiovascular;  Laterality: N/A;   RIGHT/LEFT HEART CATH AND CORONARY/GRAFT ANGIOGRAPHY N/A 10/20/2017   Procedure: RIGHT/LEFT HEART CATH AND CORONARY/GRAFT ANGIOGRAPHY;  Surgeon: MLarey Dresser MD;  [facility];  Service: Cardiovascular;  Laterality: N/A;   ROOT CANAL  10/2000   TEE WITHOUT CARDIOVERSION N/A 01/18/2019   Procedure: TRANSESOPHAGEAL ECHOCARDIOGRAM (TEE);  Surgeon: MLarey Dresser MD;  [facility];  Service: Cardiovascular;  Laterality: N/A;   TIBIA FRACTURE SURGERY Right 1970's   rods and pins   TOOTH EXTRACTION     "1 on the upper; wisdom tooth on the lower" (01/16/2018)   TOTAL ABDOMINAL HYSTERECTOMY  1970's   w/ appendectomy    There were no vitals filed for this visit.   Subjective Assessment - 12/09/20 1259     Subjective A little better, I have cough but no fever or anything, tested negative for  covid. Had a fall this morning when turning to sit, instead of going back to chair she went forward and went down. no injuries.    Currently in Pain? Yes    Pain Score 5     Pain Location Back                               [facility] Adult PT Treatment/Exercise - 12/09/20 0001       High Level Balance   High Level Balance Activities Side stepping      Lumbar Exercises: Standing   Shoulder Extension Strengthening;Both;20 reps;Theraband    Theraband Level (Shoulder Extension) Level 3 (Green)      Lumbar Exercises: Seated   Other Seated Lumbar Exercises Rows green 2x10  Knee/Hip Exercises: Aerobic   Nustep L5 x7 min      Knee/Hip Exercises: Machines for Strengthening   Cybex Knee Extension 10# 2 sets 10    Cybex Knee Flexion 25# 2 sets 10      Knee/Hip Exercises: Seated   Long Arc Quad Strengthening;Both;2 sets;10 reps;Weights    Long Arc Quad Weight 3 lbs.    Ball Squeeze 20    Marching Strengthening;Both;2 sets;10 reps    [employer] 5 lbs.    Abd/Adduction Weights 3 lbs.    Sit to Sand 2 sets;10 reps                       PT Short Term Goals - 11/06/20 1153       PT SHORT TERM GOAL #1   Title Patient to be independent with initial HEP.    Status Achieved               PT Long Term Goals - 12/09/20 1347       PT LONG TERM GOAL #1   Title Patient to be independent with advanced HEP.    Status Achieved      PT LONG TERM GOAL #2   Title Patient to demonstrate B LE strength >/=4+/5.    Status Partially Met      PT LONG TERM GOAL #3   Title Patient to score <14 sec on TUG testing in order to decrease risk of falls.    Status Partially Met      PT LONG TERM GOAL #4   Title Patient to score <13 sec on 5xSTS in order to decrease risk of falls.    Status On-going                   Plan - 12/09/20 1342     Clinical Impression Statement pt enters clinic state that she feel a little better but did have a fall  this morning. no injuries reported. Increase fatigue noted throughout session. L knee did buckle once with side steps. Added some postural strengthening requiring cue for core engagement. No reports of pain. Increase time given to rest between activities.    Personal Factors and Comorbidities Age;Comorbidity 2    Comorbidities CHF, Pneumonia    Examination-Activity Limitations Locomotion Level;Stairs    Examination-Participation Restrictions Occupation;Community Activity    Stability/Clinical Decision Making Evolving/Moderate complexity    Rehab Potential Good    PT Frequency 2x / week    PT Duration 12 weeks    PT Treatment/Interventions ADLs/Self Care Home Management;Cryotherapy;Electrical Stimulation;Iontophoresis 8m/ml Dexamethasone;Moist Heat;Gait training;Stair training;Functional mobility training;Therapeutic activities;Therapeutic exercise;Balance training;Neuromuscular re-education;Patient/family education;Manual techniques;Passive range of motion;Dry needling;Taping;Vasopneumatic Device;Joint Manipulations;Spinal Manipulations    PT Next Visit Plan progress as tolerated             Patient will benefit from skilled therapeutic intervention in order to improve the following deficits and impairments:  Decreased balance, Difficulty walking, Decreased activity tolerance, Decreased strength, Pain  Visit Diagnosis: Unsteadiness on feet  Debility  Difficulty in walking, not elsewhere classified     Problem List Patient Active Problem List   Diagnosis Date Noted   Symptomatic bradycardia 01/16/2019   Normocytic anemia 01/16/2019   Diabetic peripheral neuropathy ([place] 01/12/2019   Neurologic abnormality 12/25/2018   Debility 09/21/2018   Hypotension 09/18/2018   Bradycardia 09/18/2018   AKI (acute kidney injury) ([place] 09/18/2018   Pressure injury of skin 09/18/2018   Ischemic cerebrovascular accident (CVA) of frontal  lobe ([place]) 09/13/2018   Fall 09/09/2018   History of  CVA (cerebrovascular accident) 09/09/2018   Type II diabetes mellitus with renal manifestations ([place]) 09/09/2018   CKD (chronic kidney disease), stage III 09/09/2018   Sepsis ([place]) 09/09/2018   Depression with anxiety 09/09/2018   Slurred speech 09/09/2018   Ankle fracture, left 05/16/2018   Amiodarone pulmonary toxicity    Physical deconditioning    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[place])    Acute on chronic congestive heart failure (HCC)    Chronic combined systolic (congestive) and diastolic (congestive) heart failure ([place]) 16/11/9602   Chronic systolic CHF (congestive heart failure) ([place]) 01/16/2018   Atrial fibrillation ([place]) 10/21/2017   CAD (coronary artery disease) of bypass graft 10/20/2017   Coronary artery disease of bypass graft of native heart with stable angina pectoris (HCC)    Chronic low back pain 08/24/2016   Pain of left thumb 08/05/2016   Nocturnal hypoxia 06/02/2016   Hoarseness 04/05/2016   Laryngopharyngeal reflux (LPR) 04/05/2016   Pharyngoesophageal dysphagia 04/05/2016   Angina decubitus ([place]) 05/22/2015   Chronic insomnia 05/06/2015   Common migraine 05/14/2014   Abnormality of gait 05/14/2014   Memory change 05/14/2014   Aneurysm, cerebral, nonruptured 05/14/2014   Cramp of limb-Left neck 05/10/2014   Hematemesis with nausea    Vomiting blood    Dizziness and giddiness 09/21/2013   Atypical chest pain 07/18/2013   Unstable angina ([place]) 04/09/2013   Carotid artery stenosis, symptomatic 03/20/2013   Cerebrovascular disease 07/10/2012   Cerebral artery occlusion with cerebral infarction ([place]) 07/10/2012   Mitral regurgitation 04/15/2012   TIA (transient ischemic attack) 04/15/2012   Occlusion and stenosis of carotid artery without mention of cerebral infarction 08/18/2011   Bariatric surgery status 06/17/2011   Speech abnormality 03/22/2011   Dyspnea 02/24/2011   PAPILLARY MUSCLE DYSFUNCTION, NON-RHEUMATIC 10/09/2008   UNSPECIFIED VITAMIN D DEFICIENCY  10/24/2007   MYOCARDIAL INFARCTION, HX OF 10/24/2007   PERSISTENT VOMITING 10/24/2007   OSTEOARTHRITIS 10/24/2007   MIGRAINES, HX OF 10/24/2007   Diabetes mellitus type 2, controlled, with complications ([place]) 54/10/8117   Hyperlipemia 01/16/2007   Obesity-post failed open gastroplasty 1984  01/16/2007   OBSTRUCTIVE SLEEP APNEA 01/16/2007   Essential hypertension 01/16/2007   Coronary atherosclerosis 01/16/2007   Asthma 01/16/2007   GERD 01/16/2007   VENTRAL HERNIA 01/16/2007    Scot Jun, PTA 12/09/2020, 1:47 PM  [place]. [place], [place], [address] Phone: [phone]   Fax:  [phone]  Name: [name] [name] [name]: [id number] Date of Birth: 08-03-1945

## 2020-12-11 ENCOUNTER — Encounter: Payer: Self-pay | Admitting: Physical Therapy

## 2020-12-11 ENCOUNTER — Other Ambulatory Visit: Payer: Self-pay

## 2020-12-11 ENCOUNTER — Ambulatory Visit: Payer: Medicare Other | Admitting: Physical Therapy

## 2020-12-11 DIAGNOSIS — R5381 Other malaise: Secondary | ICD-10-CM

## 2020-12-11 DIAGNOSIS — R2681 Unsteadiness on feet: Secondary | ICD-10-CM | POA: Diagnosis not present

## 2020-12-11 DIAGNOSIS — M6281 Muscle weakness (generalized): Secondary | ICD-10-CM

## 2020-12-11 DIAGNOSIS — R262 Difficulty in walking, not elsewhere classified: Secondary | ICD-10-CM

## 2020-12-11 NOTE — Therapy (Signed)
[place] [place], [place], [address] Phone: [phone]   Fax:  [phone]  Physical Therapy Treatment  Patient Details  Name: [name] [name] [name]: [id number] Date of Birth: 1945/08/06 Referring Provider (PT): Dr Loralie Champagne   Encounter Date: 12/11/2020   PT End of Session - 12/11/20 1136     Visit Number 17    Date for PT Re-Evaluation 12/19/20    PT Start Time 1059    PT Stop Time 1144    PT Time Calculation (min) 45 min    Activity Tolerance Patient tolerated treatment well    Behavior During Therapy [facility] for tasks assessed/performed             Past Medical History:  Diagnosis Date   Anemia    hx   Anxiety    Asthma    Basal cell carcinoma 05/2014   "left shoulder"   Bundle branch block, left    chronic/notes 07/18/2013   CHF (congestive heart failure) ([place])    Chronic insomnia 05/06/2015   Chronic kidney disease    frequency, sees dr Jamal Maes every 4 to 6 months (01/16/2018)   Chronic lower back pain    Claustrophobia    Common migraine 05/14/2014   Coronary artery disease    MI in 2001, 2002, 2006, 2011, 2014   Depression    Diabetic peripheral neuropathy ([place]) 01/12/2019   GERD (gastroesophageal reflux disease)    H/O hiatal hernia    Headache    "at least 2/month" (01/16/2018)   Heart murmur    Hyperlipidemia    Hypertension    Memory change 05/14/2014   Migraine    "5-6/year"  (01/16/2018)   Obesity 01-2010   Obstructive sleep apnea    "can't wear machine; I have claustrophobia" (01/16/2018), states she had a 2nd sleep study and does not have sleep apnea, her O2 decreases and now is on 2 L of O2 at night.   On home oxygen therapy    "2L at night and prn during daytime" (01/16/2018)   Osteoarthritis    "knees and hands" (01/16/2018)   Peripheral vascular disease ([place])    ? numbness, tingling arms and legs   PONV (postoperative nausea and vomiting)    Stroke ([place])     2014, 2015, 2016   Type II diabetes mellitus ([place])    Ventral hernia    hx of    Past Surgical History:  Procedure Laterality Date   ANKLE FRACTURE SURGERY Left 1970's   APPENDECTOMY  1970's   w/hysterectomy   BASAL CELL CARCINOMA EXCISION Left 05/2014   "shoulder" (01/16/2018)   CARDIAC CATHETERIZATION  10/10/2012   Dr [name].   CARDIAC CATHETERIZATION N/A 05/29/2015   Procedure: Right/Left Heart Cath and Coronary/Graft Angiography;  Surgeon: Larey Dresser, MD;  Location: [facility];  Service: Cardiovascular;  Laterality: N/A;   CAROTID ENDARTERECTOMY Left 03/2013   CAROTID STENT INSERTION Left 03/20/2013   Procedure: CAROTID STENT INSERTION;  Surgeon: [name], MD;  Location: [facility] CATH LAB;  Service: Cardiovascular;  Laterality: Left;  internal carotid   CEREBRAL ANGIOGRAM N/A 04/05/2011   Procedure: CEREBRAL ANGIOGRAM;  Surgeon: Angelia Mould, MD;  Location: [facility] CATH LAB;  Service: Cardiovascular;  Laterality: N/A;   CHOLECYSTECTOMY OPEN  2004   CORONARY ANGIOPLASTY WITH STENT PLACEMENT  01,02,05,06,07,08,11; 04/24/2013   "I've probably got ~ 10 stents by now" (04/24/2013)   CORONARY ANGIOPLASTY WITH STENT PLACEMENT  06/13/2013   "got 4 stents today" (06/13/2013)   CORONARY ARTERY BYPASS GRAFT  1220/11   "CABG X5"   CORONARY STENT INTERVENTION N/A 10/20/2017   Procedure: CORONARY STENT INTERVENTION;  Surgeon: Troy Sine, MD;  Location: [place] [facility];  Service: Cardiovascular;  Laterality: N/A;   ESOPHAGOGASTRODUODENOSCOPY  08/03/2011   Procedure: ESOPHAGOGASTRODUODENOSCOPY (EGD);  Surgeon: Shann Medal, MD;  Location: [facility];  Service: General;  Laterality: N/A;   ESOPHAGOGASTRODUODENOSCOPY (EGD) WITH PROPOFOL N/A 03/11/2014   Procedure: ESOPHAGOGASTRODUODENOSCOPY (EGD) WITH PROPOFOL;  Surgeon: Lafayette Dragon, MD;  [facility]: [facility];  Service: Endoscopy;  Laterality: N/A;   FRACTURE SURGERY     gall stone removal  05/2003   GASTRIC RESTRICTION  SURGERY  1984   "stapeling"   HERNIA REPAIR  2004   "in my stomach; had OR on it twice", wire mesh on 1 hernia   LEFT HEART CATH AND CORS/GRAFTS ANGIOGRAPHY N/A 07/09/2016   Procedure: LEFT HEART CATH AND CORS/GRAFTS ANGIOGRAPHY;  Surgeon: Larey Dresser, MD;  Location: [place] [facility];  Service: Cardiovascular;  Laterality: N/A;   ORIF ANKLE FRACTURE Left 05/16/2018   ORIF ANKLE FRACTURE Left 05/16/2018   Procedure: OPEN REDUCTION INTERNAL FIXATION (ORIF) Left ankle with possible syndesmosis fixation;  Surgeon: Nicholes Stairs, MD;  Location: [facility];  Service: Orthopedics;  Laterality: Left;  137min   OVARY SURGERY  1970's   "tumor removed"   PERCUTANEOUS CORONARY STENT INTERVENTION (PCI-S) N/A 06/13/2013   Procedure: PERCUTANEOUS CORONARY STENT INTERVENTION (PCI-S);  Surgeon: [name], MD;  Location: [facility] CATH LAB;  Service: Cardiovascular;  Laterality: N/A;   PERCUTANEOUS STENT INTERVENTION N/A 04/24/2013   Procedure: PERCUTANEOUS STENT INTERVENTION;  Surgeon: [name], MD;  Location: [facility] CATH LAB;  Service: Cardiovascular;  Laterality: N/A;   RIGHT/LEFT HEART CATH AND CORONARY/GRAFT ANGIOGRAPHY N/A 10/20/2017   Procedure: RIGHT/LEFT HEART CATH AND CORONARY/GRAFT ANGIOGRAPHY;  Surgeon: Larey Dresser, MD;  Location: [facility];  Service: Cardiovascular;  Laterality: N/A;   ROOT CANAL  10/2000   TEE WITHOUT CARDIOVERSION N/A 01/18/2019   Procedure: TRANSESOPHAGEAL ECHOCARDIOGRAM (TEE);  Surgeon: Larey Dresser, MD;  Location: [facility] ENDOSCOPY;  Service: Cardiovascular;  Laterality: N/A;   TIBIA FRACTURE SURGERY Right 1970's   rods and pins   TOOTH EXTRACTION     "1 on the upper; wisdom tooth on the lower" (01/16/2018)   TOTAL ABDOMINAL HYSTERECTOMY  1970's   w/ appendectomy    There were no vitals filed for this visit.   Subjective Assessment - 12/11/20 1059     Subjective "I am feeling ok, same old back crap, other than that Im alright" "Im normal"     Currently in Pain? Yes    Pain Score 5     Pain Location Back                               [facility] Adult PT Treatment/Exercise - 12/11/20 0001       High Level Balance   High Level Balance Activities Side stepping    High Level Balance Comments length of mat table x3      Lumbar Exercises: Standing   Row Strengthening;Both;20 reps;Theraband    Theraband Level (Row) Level 3 (Green)      Knee/Hip Exercises: Aerobic   Nustep L5 x7min      Knee/Hip Exercises: Machines for Strengthening   Cybex Knee Extension 10# 2  sets 12    Cybex Knee Flexion 25# 2 sets 12      Knee/Hip Exercises: Standing   Other Standing Knee Exercises Standing march with rollator 2x10   On airex     Knee/Hip Exercises: Seated   Sit to Sand 2 sets;10 reps   on Airex, some UE use                      PT Short Term Goals - 11/06/20 1153       PT SHORT TERM GOAL #1   Title Patient to be independent with initial HEP.    Status Achieved               PT Long Term Goals - 12/11/20 1136       PT LONG TERM GOAL #1   Title Patient to be independent with advanced HEP.    Status Achieved                   Plan - 12/11/20 1137     Clinical Impression Statement pt did well overall today in therapy with no reports of increase pain. Session put more focus on standing interventions. Sit to stand on airex did make it more challenging for patient. Some LLE shaking during the SLS phase of marching during the latter reps. Cues needed to put less weight on Rolator with marches. Postural cues required with both standing rows and extensions.No lob with side step but pt did have some increase fatigue. Increase reps performs with seated le curls and extensions.    Personal Factors and Comorbidities Age;Comorbidity 2    Comorbidities CHF, Pneumonia    Examination-Activity Limitations Locomotion Level;Stairs    Examination-Participation Restrictions Occupation;Community  Activity    Stability/Clinical Decision Making Evolving/Moderate complexity    Rehab Potential Good    PT Frequency 2x / week    PT Duration 12 weeks    PT Treatment/Interventions ADLs/Self Care Home Management;Cryotherapy;Electrical Stimulation;Iontophoresis 4mg /ml Dexamethasone;Moist Heat;Gait training;Stair training;Functional mobility training;Therapeutic activities;Therapeutic exercise;Balance training;Neuromuscular re-education;Patient/family education;Manual techniques;Passive range of motion;Dry needling;Taping;Vasopneumatic Device;Joint Manipulations;Spinal Manipulations    PT Next Visit Plan progress as tolerated             Patient will benefit from skilled therapeutic intervention in order to improve the following deficits and impairments:  Decreased balance, Difficulty walking, Decreased activity tolerance, Decreased strength, Pain  Visit Diagnosis: Unsteadiness on feet  Debility  Muscle weakness (generalized)  Difficulty in walking, not elsewhere classified     Problem List Patient Active Problem List   Diagnosis Date Noted   Symptomatic bradycardia 01/16/2019   Normocytic anemia 01/16/2019   Diabetic peripheral neuropathy ([place]) 01/12/2019   Neurologic abnormality 12/25/2018   Debility 09/21/2018   Hypotension 09/18/2018   Bradycardia 09/18/2018   AKI (acute kidney injury) ([place]) 09/18/2018   Pressure injury of skin 09/18/2018   Ischemic cerebrovascular accident (CVA) of frontal lobe ([place]) 09/13/2018   Fall 09/09/2018   History of CVA (cerebrovascular accident) 09/09/2018   Type II diabetes mellitus with renal manifestations ([place]) 09/09/2018   CKD (chronic kidney disease), stage III 09/09/2018   Sepsis ([place]) 09/09/2018   Depression with anxiety 09/09/2018   Slurred speech 09/09/2018   Ankle fracture, left 05/16/2018   Amiodarone pulmonary toxicity    Physical deconditioning    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[place])    Acute on chronic congestive heart  failure ([place])    Chronic combined systolic (congestive) and diastolic (congestive) heart failure ([place]) 01/16/2018  Chronic systolic CHF (congestive heart failure) ([place]) 01/16/2018   Atrial fibrillation ([place]) 10/21/2017   CAD (coronary artery disease) of bypass graft 10/20/2017   Coronary artery disease of bypass graft of native heart with stable angina pectoris (HCC)    Chronic low back pain 08/24/2016   Pain of left thumb 08/05/2016   Nocturnal hypoxia 06/02/2016   Hoarseness 04/05/2016   Laryngopharyngeal reflux (LPR) 04/05/2016   Pharyngoesophageal dysphagia 04/05/2016   Angina decubitus ([place]) 05/22/2015   Chronic insomnia 05/06/2015   Common migraine 05/14/2014   Abnormality of gait 05/14/2014   Memory change 05/14/2014   Aneurysm, cerebral, nonruptured 05/14/2014   Cramp of limb-Left neck 05/10/2014   Hematemesis with nausea    Vomiting blood    Dizziness and giddiness 09/21/2013   Atypical chest pain 07/18/2013   Unstable angina ([place]) 04/09/2013   Carotid artery stenosis, symptomatic 03/20/2013   Cerebrovascular disease 07/10/2012   Cerebral artery occlusion with cerebral infarction ([place]) 07/10/2012   Mitral regurgitation 04/15/2012   TIA (transient ischemic attack) 04/15/2012   Occlusion and stenosis of carotid artery without mention of cerebral infarction 08/18/2011   Bariatric surgery status 06/17/2011   Speech abnormality 03/22/2011   Dyspnea 02/24/2011   PAPILLARY MUSCLE DYSFUNCTION, NON-RHEUMATIC 10/09/2008   UNSPECIFIED VITAMIN D DEFICIENCY 10/24/2007   MYOCARDIAL INFARCTION, HX OF 10/24/2007   PERSISTENT VOMITING 10/24/2007   OSTEOARTHRITIS 10/24/2007   MIGRAINES, HX OF 10/24/2007   Diabetes mellitus type 2, controlled, with complications ([place]) 88/28/0034   Hyperlipemia 01/16/2007   Obesity-post failed open gastroplasty 1984  01/16/2007   OBSTRUCTIVE SLEEP APNEA 01/16/2007   Essential hypertension 01/16/2007   Coronary atherosclerosis 01/16/2007   Asthma  01/16/2007   GERD 01/16/2007   VENTRAL HERNIA 01/16/2007    Scot Jun, PTA 12/11/2020, 11:41 AM  [place] [place], [place], [address] Phone: [phone]   Fax:  [phone]  Name: [name] [name] [name]: [id number] Date of Birth: [birth year]

## 2020-12-16 ENCOUNTER — Ambulatory Visit: Payer: Medicare Other | Admitting: Physical Therapy

## 2020-12-18 ENCOUNTER — Other Ambulatory Visit: Payer: Self-pay

## 2020-12-18 ENCOUNTER — Ambulatory Visit: Payer: Medicare Other | Admitting: Physical Therapy

## 2020-12-18 DIAGNOSIS — R2681 Unsteadiness on feet: Secondary | ICD-10-CM

## 2020-12-18 DIAGNOSIS — M6281 Muscle weakness (generalized): Secondary | ICD-10-CM

## 2020-12-18 DIAGNOSIS — R5381 Other malaise: Secondary | ICD-10-CM

## 2020-12-18 NOTE — Therapy (Signed)
[place] [place], [place], [address] Phone: [phone]   Fax:  [phone]  Physical Therapy Treatment  Patient Details  Name: [name] [name] [name]: [id number] Date of Birth: November 20, 1945 Referring Provider (PT): Dr Loralie Champagne   Encounter Date: 12/18/2020   PT End of Session - 12/18/20 1144     Visit Number 18    Date for PT Re-Evaluation 12/19/20    PT Start Time 35    PT Stop Time 6144    PT Time Calculation (min) 45 min             Past Medical History:  Diagnosis Date   Anemia    hx   Anxiety    Asthma    Basal cell carcinoma 05/2014   "left shoulder"   Bundle branch block, left    chronic/notes 07/18/2013   CHF (congestive heart failure) ([place])    Chronic insomnia 05/06/2015   Chronic kidney disease    frequency, sees dr Jamal Maes every 4 to 6 months (01/16/2018)   Chronic lower back pain    Claustrophobia    Common migraine 05/14/2014   Coronary artery disease    MI in 2001, 2002, 2006, 2011, 2014   Depression    Diabetic peripheral neuropathy ([place]) 01/12/2019   GERD (gastroesophageal reflux disease)    H/O hiatal hernia    Headache    "at least 2/month" (01/16/2018)   Heart murmur    Hyperlipidemia    Hypertension    Memory change 05/14/2014   Migraine    "5-6/year"  (01/16/2018)   Obesity 01-2010   Obstructive sleep apnea    "can't wear machine; I have claustrophobia" (01/16/2018), states she had a 2nd sleep study and does not have sleep apnea, her O2 decreases and now is on 2 L of O2 at night.   On home oxygen therapy    "2L at night and prn during daytime" (01/16/2018)   Osteoarthritis    "knees and hands" (01/16/2018)   Peripheral vascular disease ([place])    ? numbness, tingling arms and legs   PONV (postoperative nausea and vomiting)    Stroke ([place])    2014, 2015, 2016   Type II diabetes mellitus ([place])    Ventral hernia    hx of    Past Surgical History:   Procedure Laterality Date   ANKLE FRACTURE SURGERY Left 1970's   APPENDECTOMY  1970's   w/hysterectomy   BASAL CELL CARCINOMA EXCISION Left 05/2014   "shoulder" (01/16/2018)   CARDIAC CATHETERIZATION  10/10/2012   Dr [name].   CARDIAC CATHETERIZATION N/A 05/29/2015   Procedure: Right/Left Heart Cath and Coronary/Graft Angiography;  Surgeon: Larey Dresser, MD;  Location: [facility];  Service: Cardiovascular;  Laterality: N/A;   CAROTID ENDARTERECTOMY Left 03/2013   CAROTID STENT INSERTION Left 03/20/2013   Procedure: CAROTID STENT INSERTION;  Surgeon: [name], MD;  Location: [facility] CATH LAB;  Service: Cardiovascular;  Laterality: Left;  internal carotid   CEREBRAL ANGIOGRAM N/A 04/05/2011   Procedure: CEREBRAL ANGIOGRAM;  Surgeon: Angelia Mould, MD;  Location: [facility] CATH LAB;  Service: Cardiovascular;  Laterality: N/A;   CHOLECYSTECTOMY OPEN  2004   CORONARY ANGIOPLASTY WITH STENT PLACEMENT  01,02,05,06,07,08,11; 04/24/2013   "I've probably got ~ 10 stents by now" (04/24/2013)   CORONARY ANGIOPLASTY WITH STENT PLACEMENT  06/13/2013   "got 4 stents today" (06/13/2013)   CORONARY ARTERY BYPASS GRAFT  1220/11   "CABG  X5"   CORONARY STENT INTERVENTION N/A 10/20/2017   Procedure: CORONARY STENT INTERVENTION;  Surgeon: Troy Sine, MD;  Location: [facility];  Service: Cardiovascular;  Laterality: N/A;   ESOPHAGOGASTRODUODENOSCOPY  08/03/2011   Procedure: ESOPHAGOGASTRODUODENOSCOPY (EGD);  Surgeon: Shann Medal, MD;  [facility];  Service: General;  Laterality: N/A;   ESOPHAGOGASTRODUODENOSCOPY (EGD) WITH PROPOFOL N/A 03/11/2014   Procedure: ESOPHAGOGASTRODUODENOSCOPY (EGD) WITH PROPOFOL;  Surgeon: Lafayette Dragon, MD;  [facility];  Service: Endoscopy;  Laterality: N/A;   FRACTURE SURGERY     gall stone removal  05/2003   GASTRIC RESTRICTION SURGERY  1984   "stapeling"   HERNIA REPAIR  2004   "in my stomach; had OR on it twice", wire mesh on 1 hernia    LEFT HEART CATH AND CORS/GRAFTS ANGIOGRAPHY N/A 07/09/2016   Procedure: LEFT HEART CATH AND CORS/GRAFTS ANGIOGRAPHY;  Surgeon: Larey Dresser, MD;  Location: [facility];  Service: Cardiovascular;  Laterality: N/A;   ORIF ANKLE FRACTURE Left 05/16/2018   ORIF ANKLE FRACTURE Left 05/16/2018   Procedure: OPEN REDUCTION INTERNAL FIXATION (ORIF) Left ankle with possible syndesmosis fixation;  Surgeon: Nicholes Stairs, MD;  Location: [facility];  Service: Orthopedics;  Laterality: Left;  173mn   OVARY SURGERY  1970's   "tumor removed"   PERCUTANEOUS CORONARY STENT INTERVENTION (PCI-S) N/A 06/13/2013   Procedure: PERCUTANEOUS CORONARY STENT INTERVENTION (PCI-S);  Surgeon: JJettie Booze MD;  [facility];  Service: Cardiovascular;  Laterality: N/A;   PERCUTANEOUS STENT INTERVENTION N/A 04/24/2013   Procedure: PERCUTANEOUS STENT INTERVENTION;  Surgeon: JJettie Booze MD;  [facility];  Service: Cardiovascular;  Laterality: N/A;   RIGHT/LEFT HEART CATH AND CORONARY/GRAFT ANGIOGRAPHY N/A 10/20/2017   Procedure: RIGHT/LEFT HEART CATH AND CORONARY/GRAFT ANGIOGRAPHY;  Surgeon: MLarey Dresser MD;  [facility];  Service: Cardiovascular;  Laterality: N/A;   ROOT CANAL  10/2000   TEE WITHOUT CARDIOVERSION N/A 01/18/2019   Procedure: TRANSESOPHAGEAL ECHOCARDIOGRAM (TEE);  Surgeon: MLarey Dresser MD;  [facility];  Service: Cardiovascular;  Laterality: N/A;   TIBIA FRACTURE SURGERY Right 1970's   rods and pins   TOOTH EXTRACTION     "1 on the upper; wisdom tooth on the lower" (01/16/2018)   TOTAL ABDOMINAL HYSTERECTOMY  1970's   w/ appendectomy    There were no vitals filed for this visit.   Subjective Assessment - 12/18/20 1100     Subjective sorry I canceled Tuesday- just did not feel good. no falls since last reported fall.    Currently in Pain? Yes    Pain Score 5     Pain Location Back    Pain Orientation Lower                                [facility] Adult PT Treatment/Exercise - 12/18/20 0001       Lumbar Exercises: Aerobic   UBE (Upper Arm Bike) L 2 2 min fwd/2 min back    Nustep L 5 10 min      Lumbar Exercises: Machines for Strengthening   Cybex Lumbar Extension black tband 2 sets 10      Lumbar Exercises: Standing   Row Strengthening;Both;20 reps;Theraband    Theraband Level (Row) Level 3 (Green)    Shoulder Extension Strengthening;Both;20 reps;Theraband    Theraband Level (Shoulder Extension) Level 3 (Green)  Lumbar Exercises: Seated   Sit to Stand 10 reps   from mat without UE     Knee/Hip Exercises: Aerobic   Nustep L5 x47mn      Knee/Hip Exercises: Machines for Strengthening   Cybex Knee Extension 10# 2 sets 12    Cybex Knee Flexion 25# 2 sets 12      Knee/Hip Exercises: Standing   Walking with Sports Cord 20# 5  fwd/back CGA   with RW                      PT Short Term Goals - 11/06/20 1153       PT SHORT TERM GOAL #1   Title Patient to be independent with initial HEP.    Status Achieved               PT Long Term Goals - 12/18/20 1123       PT LONG TERM GOAL #1   Title Patient to be independent with advanced HEP.    Status Achieved      PT LONG TERM GOAL #2   Title Patient to demonstrate B LE strength >/=4+/5.    Baseline grossly 4/5 .pt also fatigues quickly    Status Partially Met      PT LONG TERM GOAL #3   Title Patient to score <14 sec on TUG testing in order to decrease risk of falls.    Baseline 16  sec with RW    Status Partially Met      PT LONG TERM GOAL #4   Title Patient to score <13 sec on 5xSTS in order to decrease risk of falls.    Baseline from mat without UE 34 sec, left knee gave out 1 x    Status Partially Met      PT LONG TERM GOAL #5   Title Patient to report tolerance for 15 min of walking with LRAD without fatigue limiting.    Status Partially Met                   Plan - 12/18/20 1144      Clinical Impression Statement progressing with goals. STS knee buckled and she was very ftaigued at end of session and had to amb out very slowly.    PT Treatment/Interventions ADLs/Self Care Home Management;Cryotherapy;Electrical Stimulation;Iontophoresis 455mml Dexamethasone;Moist Heat;Gait training;Stair training;Functional mobility training;Therapeutic activities;Therapeutic exercise;Balance training;Neuromuscular re-education;Patient/family education;Manual techniques;Passive range of motion;Dry needling;Taping;Vasopneumatic Device;Joint Manipulations;Spinal Manipulations    PT Next Visit Plan pt would like to continue PT but starting mid Dec will have limited transportation and will be on vacation. will od renewal next sesson             Patient will benefit from skilled therapeutic intervention in order to improve the following deficits and impairments:  Decreased balance, Difficulty walking, Decreased activity tolerance, Decreased strength, Pain  Visit Diagnosis: Unsteadiness on feet  Debility  Muscle weakness (generalized)     Problem List Patient Active Problem List   Diagnosis Date Noted   Symptomatic bradycardia 01/16/2019   Normocytic anemia 01/16/2019   Diabetic peripheral neuropathy (H[place]ite12/12/2018   Neurologic abnormality 12/25/2018   Debility 09/21/2018   Hypotension 09/18/2018   Bradycardia 09/18/2018   AKI (acute kidney injury) ([place]/17/2020   Pressure injury of skin 09/18/2018   Ischemic cerebrovascular accident (CVA) of frontal lobe ([place]08/01/2019   Fall 09/09/2018   History of CVA (cerebrovascular accident) 09/09/2018   Type II diabetes mellitus with  renal manifestations ([place]) 09/09/2018   CKD (chronic kidney disease), stage III 09/09/2018   Sepsis ([place]) 09/09/2018   Depression with anxiety 09/09/2018   Slurred speech 09/09/2018   Ankle fracture, left 05/16/2018   Amiodarone pulmonary toxicity    Physical deconditioning    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 ([place])    Acute on chronic congestive heart failure (HCC)    Chronic combined systolic (congestive) and diastolic (congestive) heart failure ([place]) 83/25/4982   Chronic systolic CHF (congestive heart failure) ([place]) 01/16/2018   Atrial fibrillation ([place]) 10/21/2017   CAD (coronary artery disease) of bypass graft 10/20/2017   Coronary artery disease of bypass graft of native heart with stable angina pectoris (HCC)    Chronic low back pain 08/24/2016   Pain of left thumb 08/05/2016   Nocturnal hypoxia 06/02/2016   Hoarseness 04/05/2016   Laryngopharyngeal reflux (LPR) 04/05/2016   Pharyngoesophageal dysphagia 04/05/2016   Angina decubitus ([place]) 05/22/2015   Chronic insomnia 05/06/2015   Common migraine 05/14/2014   Abnormality of gait 05/14/2014   Memory change 05/14/2014   Aneurysm, cerebral, nonruptured 05/14/2014   Cramp of limb-Left neck 05/10/2014   Hematemesis with nausea    Vomiting blood    Dizziness and giddiness 09/21/2013   Atypical chest pain 07/18/2013   Unstable angina ([place]) 04/09/2013   Carotid artery stenosis, symptomatic 03/20/2013   Cerebrovascular disease 07/10/2012   Cerebral artery occlusion with cerebral infarction ([place]) 07/10/2012   Mitral regurgitation 04/15/2012   TIA (transient ischemic attack) 04/15/2012   Occlusion and stenosis of carotid artery without mention of cerebral infarction 08/18/2011   Bariatric surgery status 06/17/2011   Speech abnormality 03/22/2011   Dyspnea 02/24/2011   PAPILLARY MUSCLE DYSFUNCTION, NON-RHEUMATIC 10/09/2008   UNSPECIFIED VITAMIN D DEFICIENCY 10/24/2007   MYOCARDIAL INFARCTION, HX OF 10/24/2007   PERSISTENT VOMITING 10/24/2007   OSTEOARTHRITIS 10/24/2007   MIGRAINES, HX OF 10/24/2007   Diabetes mellitus type 2, controlled, with complications ([place]) 64/15/8309   Hyperlipemia 01/16/2007   Obesity-post failed open gastroplasty 1984  01/16/2007   OBSTRUCTIVE SLEEP APNEA 01/16/2007   Essential hypertension  01/16/2007   Coronary atherosclerosis 01/16/2007   Asthma 01/16/2007   GERD 01/16/2007   VENTRAL HERNIA 01/16/2007    Slayden Mennenga,ANGIE, PTA 12/18/2020, 11:46 AM  [place] [place], [place], [address] Phone: [phone]   Fax:  [phone]  Name: [name] [name] [name]: [id number] Date of Birth: 02/11/1945

## 2020-12-23 ENCOUNTER — Other Ambulatory Visit: Payer: Self-pay

## 2020-12-23 ENCOUNTER — Ambulatory Visit: Payer: Medicare Other | Admitting: Physical Therapy

## 2020-12-23 DIAGNOSIS — R2681 Unsteadiness on feet: Secondary | ICD-10-CM

## 2020-12-23 DIAGNOSIS — R262 Difficulty in walking, not elsewhere classified: Secondary | ICD-10-CM

## 2020-12-23 DIAGNOSIS — R5381 Other malaise: Secondary | ICD-10-CM

## 2020-12-23 DIAGNOSIS — M6281 Muscle weakness (generalized): Secondary | ICD-10-CM

## 2020-12-23 NOTE — Progress Notes (Signed)
Date:  12/29/2020   ID:  CASANDRA DALLAIRE, DOB August 26, 1945, [name] [id number]   Provider location: [facility] Type of Visit: Established patient   PCP:  [name] Late, MD  Cardiologist:  Dr. Aundra [name]  Chief Complaint: Exertional shortness of breath   History of Present Illness: [name] [name] is a 75 y.o. female who has a history of CAD s/p CABG, diastolic CHF, and cerebrovascular disease with history of CVA presents for followup of CHF and diastolic CHF. She had CABG x 5 in 12/11.  Prior to the CABG she had multiple PCIs.  She had a CVA in 2/14 that presented as visual loss.  She had a left carotid stent in 2/15.    Left heart cath done in Sept. 2014. This showed patent SVG-D, LIMA-LAD, and sequential SVG-OM branches but SVG-RCA and the native RCA were both totally occluded. It was felt aht her increased symptoms coincided with occlusion of SVG-RCA.  She was started on Imdur to see if this would help with the chest pain and dyspnea.  However, she feels like Imdur causes leg cramps and does not think that she can take it.  So ranolazine 500 mg bid started and titrated this up to 1000 mg bid.  This helped some but not markedly.  Therefore, sent to  Dr. Irish [name] to address opening RCA CTO. He was able to do this in 5/15 with 4 overlapping Xience DES.  This led to resolution of exertional chest pain.     [name] [name] was started on Brilinta after PCI.  She became much more short of breath after starting on Brilinta. Per Dr [name] [name] stopped and replaced with Plavix.  Dyspnea improved off Brilinta. Given increasing exertional dyspnea and chest pain,  RHC/LHC was done in 4/17.  This showed stable CAD with no interventional target.  Left and right heart filling pressures were not significantly increased.  Medical management.    She had an MRI/MRA in May 2018 that showed moderately severe stenosis of the supraclinoid segment of the right internal carotid artery, progressed  since the 2016 MR angiogram and severe basilar artery stenosis.    She developed recurrent exertional chest pain and had LHC in 6/18, showing 4/5 grafts patent with patent native RCA (similar to prior cath, no changes).     Echo 1/19 with EF 55-60%, moderate diastolic dysfunction, PASP 50 mmHg, mildly dilated RV, mild AS, mild-moderate MR.    She reported increased dyspnea and chest pain and had RHC/LHC in 9/19.  She had DES to sequential SVG-OM1/PLOM.  While hospitalized, she was noted to have runs of atrial fibrillation.  She was very symptomatic with the atrial fibrillation.  Eliquis and amiodarone were started.    She was admitted 12/16-12/19/19 for cardiomems implant and RHC. She was diuresed with IV lasix and transitioned to torsemide 100 mg BID + metolazone once/week. DC weight: 295 lbs.   She had a prolonged hospitalization in 2/20 with acute respiratory failure and fever to 103.  She was thought to have PNA and treated with cefepime.  She was intubated.  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  Amiodarone was stopped and she was put on prednisone. Echo in 2/20 showed EF down to 25-30% with mid-apical LV severe hypokinesis. She had AKI. Possible ITP triggered by infection, HIT negative.  ITP was treated with Solumedrol. She was discharged to SNF.    ORIF left ankle 4/20.   11/20  admission initially with concern for TIAs (staring spells), ended up thinking possible partial seizures.  Head MRI did not show CVA.  Echo in 11/20 showed EF 45-50%, moderately decreased RV systolic function, moderate RV enlargement, moderate-severe MR, moderate TR.   Patient was admitted again in 12/20 with AKI and hypotension in the setting of sinus bradycardia (HR 30s).  She was volume overloaded on exam.  Toprol XL was stopped and HR increased to 60s.  She was diuresed with IV Lasix.  TEE in 12/20 showed EF 50-55%, septal-lateral dyssynchrony, mildly decreased RV function, moderate  MR.   Atypical chest pain in 1/21, Cardiolite showed EF 57%, fixed inferior defect (likely artifact), no ischemia.   Echo in 5/22 showed EF 60-65%, moderate LVH, normal RV, PASP 37 mmHg, 2.5 x 2.1 cm calcified mass posterior mitral annulus with mild-moderate MR => ?severe MAC but more circumscribed and prominent than in the past, mild AS mean gradient 12 mmHg and AVA 1.83 cm^2.   Today she returns for HF follow up. She continues to do outpatient rehab 2x/week and doing well with this. She is gaining more strength in her legs. She continues to struggle with her balance and she has falls almost weekly. She has follow up with Neurology for this soon. Feels breathing is a little more shallow lately. She continues with occasional atypical chest pain. Denies palpitations, edema, abnormal bleeding. She chronically sleeps reclined.  Appetite ok. No fever or chills. Weight at home 214 pounds. Taking all medications. BP at home was 220s/105 a couple weeks ago, stayed elevated for a couple days. She has not missed any cardiac medications. Checks BP in the AM and at night before bed. Now using oxygen 3-4x/week at night.   Cardiomems goal 10, today's dPAP 8  ECG (personally reviewed): SB 54, LBBB   Labs (6/14): K 3.8, creatinine 0.71 Labs (8/14): BNP 249, LDL 166 Labs (9/14): K 4.7, creatinine 0.7 Labs (9/14): K 4.6, creatinine 0.9, BNP 109 Labs (1/15): K 4.5, creatinine 0.73, LDL 212, HCT 43.4, TSH normal, BNP 138 Labs (6/15): K 4.7, creatinine 1.17, LDL 100, HDL 37, TGs 363, BNP 39 Labs (10/15): K 4.6, creatinine 1.2 Labs (1/16): LDL 157, HDL 43, K 5.2, creatinine 0.95 Labs (2/16): K 4.5, creatinine 1.08, BNP 113 Labs (4/16): LDL 50, HDL 56, TGs 179 Labs (9/16): K 5.3, creatinine 1.09 Labs (10/16): LDL 75, HDL 56 Labs (2/17): K 5.2, creatinine 1.07 Labs (4/17): K 5.1, creatinine 1.07 Labs (5/17): K 4.5, creatinine 1.01, LDL 96, HDL 56, BNP 70 Labs (03/2016) K 4.9, creatinine 1.07 Labs (7/18): K 4.9,  creatinine 1.15 Labs (11/18): K 5.1, creatinine 1.11, LDL 36 Labs (1/19): K 4.9, creatinine 1.22 Labs (9/19): LDL 108 Labs (10/19): hgb 14.5, K 4.3, creatinine 1.03 Labs 12/06/2017: K 4.7 Creatinine 1.7 Labs (3/20): K 4.8, creatinine 1.39, hgb 12.4 plts 226 Labs (11/20): K 4.2, creatinine 1.01, LDL 38 Labs (12/20): K 3.5, creatinine 1.8 Labs (3/21): K 4.5, creatinine 1.5 Labs (2/22): K 4.2, creatinine 1.13 Labs (4/22): LDL 58 Labs (9/22): K 4.3, creatinine 1.27 Labs (10/22): K 4.6, creatinine 1.38   PMH: 1. Diabetic gastroparesis 2. Type II diabetes 3. HTN 4. Morbid obesity 5. CAD: s/p CABG in 12/11 after prior PCIs.  LIMA-LAD, SVG-D, seq SVG-OM1 and OM2, SVG-PDA. Adenosine Cardiolite (8/14) with EF 53% and a small reversible apical defect with a medium, partially reversible inferior defect.  LHC (9/14) with patent SVG-D, LIMA-LAD (50% distal LAD), and sequential SVG-OM branches; the SVG-RCA and the  native RCA were occluded.  This was managed medically initially, but with ongoing exertional chest pain, it was decided to open CTO.  Patient had CTO opening with 4 overlapping Xience DES in the RCA in 5/15.   - LHC (4/17): SVG-D patent, LIMA-LAD patent, distal LAD with several 40-50% stenoses, sequential SVG-OM1 and PLOM patent with 50% proximal stenosis (not flow limiting), SVG-RCA TO with patent RCA stents.  - LHC (6/18): 4/5 grafts patent (SVG-RCA TO, same as past), RCA stents patent => no change.  - LHC (9/19): Sequential SVG-PLOM/OM1 with 80% proximal stenosis, s/p DES.  - Cardiolite (1/21): EF 57%, fixed inferior defect (likely artifact), no ischemia.  6. Atypical migraines 7. OHS/OSA: Intolerant of CPAP. Uses oxygen with exertion and at night.  8. GERD with hiatal hernia 9. OA 10. Chronic systolic CHF: Echo (08/14) with EF 50-55%, grade II diastolic dysfunction, mild-moderate MR.  Echo (8/14) with EF 55-60%, grade II diastolic dysfunction, mildly increased aortic valve gradient (mean  12 mmHg) but valve opens well, mild MR and mild RV dilation.  Echo (9/15) with EF 60-65%, grade II diastolic dysfunction, mild aortic stenosis, mild mitral stenosis, mild to moderate mitral regurgitation, mildly dilated RV with normal systolic function, PA systolic pressure 46 mmHg.  - RHC (4/17): mean RA 9, PA 38/15 mean 27, mean PCWP 9, CI 2.5.  - Echo (5/17): EF 55-60%, mild LVH, mildly dilated RV with low normal systolic function, moderate TR, PASP 61 mmHg - Echo (1/19): EF 55-60%, moderate diastolic dysfunction, PASP 50 mmHg, mildly dilated RV, mild AS, mild-moderate MR. - RHC (9/19): mean RA 11, PA 34/12, mean PCWP 15, CI 2.85 - Echo (3/20): EF 25-30% with mid-apical severe hypokinesis.  - Echo (11/20): EF 45-50%, moderately dilated RV with moderately decreased systolic function, moderate-severe MR, moderate TR.  - TEE (12/20): EF 50-55%, septal-lateral dyssynchrony, moderate RV dilation/mildly decreased function, peak RV-RA gradient 51 mmHg, moderate MR.  - Echo (5/22): EF 60-65%, moderate LVH, normal RV, PASP 37 mmHg, 2.5 x 2.1 cm calcified mass posterior mitral annulus with mild-moderate MR => ?severe MAC but more circumscribed and prominent than in the past, mild AS mean gradient 12 mmHg and AVA 1.83 cm^2.  11. CKD 12. Chronic LBBB 13. Anxiety 14. Carotid stenosis: Followed by [facility], >48% LICA stenosis 18/56.  She had left carotid stent in 2/15. Carotid dopplers 8/15 with no significant disease. Carotid dopplers (4/16): < 40% RICA stenosis.  - Carotid dopplers (9/22): 1-39% BICA stenosis.  15. Cerebrovascular disease: CVA 2/14 with right posterior cerebral artery territory ischemic infarction. Cerebral angiogram in 6/14 showed 70% right vertebral ostial stenosis, 31-49% LICA stenosis, > 70% proximal left posterior cerebral artery stenosis, posterior communicating artery aneurysm.  Patient has had episodes of transient expressive aphasia.  Carotid dopplers (26/37) showed >85% LICA stenosis. She  had a left carotid stent 2/15.  Possible CVA in 7/15. -  MRI/MRA in May 2018 that showed moderately severe stenosis of the supraclinoid segment of the right internal carotid artery, progressed since the 2016 MR angiogram and severe basilar artery stenosis.  16. Positional vertigo (suspected) 17. Palpitations: Holter (6/15) with rare PVCs/PACs.  - Event monitor (2/18): NSR, occasional PVCs 18. Dyspnea with Brilinta. 19. Melanoma: On face, s/p excision.   20. Aortic stenosis: Mild on 5/22 echo.  21. Holter (8/16) with no significant arrhythmias.  22. Lower extremity arterial dopplers (9/16) were normal.  23. Sleep study (4/18): No significant OSA.  24. Atrial fibrillation: Paroxysmal - Amiodarone stopped, ?lung toxicity.  25. H/o ITP in 3/20.  16. Partial seizures 17. Mitral regurgitation: Moderate on TEE in 12/20.  - Mild to moderate on 5/22 echo.    Current Outpatient Medications  Medication Sig Dispense Refill   acetaminophen (TYLENOL) 500 MG tablet Take 500 mg by mouth every 6 (six) hours as needed for mild pain, moderate pain, fever or headache.     albuterol (VENTOLIN HFA) 108 (90 Base) MCG/ACT inhaler Inhale 2 puffs into the lungs every 4 (four) hours as needed for wheezing or shortness of breath.      Alirocumab (PRALUENT) 150 MG/ML SOAJ Inject 150 mg/mL into the skin every 14 (fourteen) days.     ALPRAZolam (XANAX) 0.5 MG tablet Take 1-2 tablets (0.5-1 mg total) by mouth See admin instructions. Take one tablet (0.5 mg) by mouth every morning and two tablets (1 mg) at night, may also take 1 tablet (0.5 mg) midday as needed for anxiety     amLODipine (NORVASC) 5 MG tablet TAKE ONE (1) TABLET BY MOUTH EACH DAY 90 tablet 3   B Complex-C (B-COMPLEX WITH VITAMIN C) tablet Take 1 tablet by mouth daily.     bisacodyl (DULCOLAX) 5 MG EC tablet Take 5 mg by mouth daily as needed for moderate constipation.     buPROPion (WELLBUTRIN XL) 150 MG 24 hr tablet Take 150 mg by mouth every morning.      cholecalciferol (VITAMIN D3) 25 MCG (1000 UNIT) tablet Take 1,000 Units by mouth daily.     clopidogrel (PLAVIX) 75 MG tablet TAKE ONE TABLET BY MOUTH AT BEDTIME 90 tablet 3   donepezil (ARICEPT) 5 MG tablet Take 1 tablet (5 mg total) by mouth at bedtime. 30 tablet 0   DULoxetine (CYMBALTA) 30 MG capsule Take 1 capsule (30 mg total) by mouth daily. 30 capsule 0   ELIQUIS 5 MG TABS tablet TAKE ONE TABLET BY MOUTH TWICE DAILY 180 tablet 3   ezetimibe (ZETIA) 10 MG tablet Take 1 tablet (10 mg total) by mouth at bedtime.     Fluticasone-Salmeterol (ADVAIR) 250-50 MCG/DOSE AEPB Inhale 1 puff into the lungs daily.     gabapentin (NEURONTIN) 600 MG tablet Take 600 mg by mouth 4 (four) times daily as needed.     Insulin Glargine (LANTUS SOLOSTAR) 100 UNIT/ML Solostar Pen Inject 38-44 Units into the skin See admin instructions. Inject 44 units subcutaneously daily at bedtime, increase to 44 units for CBG >200     insulin lispro (HUMALOG) 100 UNIT/ML KwikPen Inject 14-20 Units into the skin See admin instructions. Inject 14 units subcutaneously prior to breakfast and supper; add 4 units for CBG >200     ipratropium-albuterol (DUONEB) 0.5-2.5 (3) MG/3ML SOLN Take 3 mLs by nebulization 2 (two) times daily as needed (asthma).     methocarbamol (ROBAXIN) 500 MG tablet Take 500 mg by mouth 3 (three) times daily as needed.     Multiple Vitamin (MULTIVITAMIN WITH MINERALS) TABS tablet Take 1 tablet by mouth every morning. Centrum - Women over 55     Multiple Vitamins-Minerals (OCUVITE EYE HEALTH FORMULA) CAPS Take 1 capsule by mouth every morning.     nitroGLYCERIN (NITROSTAT) 0.3 MG SL tablet Place 1 tablet (0.3 mg total) under the tongue every 5 (five) minutes x 3 doses as needed for chest pain. 25 tablet 1   ondansetron (ZOFRAN-ODT) 4 MG disintegrating tablet Take 4 mg by mouth 2 (two) times daily as needed for nausea or vomiting.     OXYGEN Inhale 2 L into  the lungs at bedtime as needed (shortness of breath).       pantoprazole (PROTONIX) 40 MG tablet Take 1 tablet (40 mg total) by mouth daily. 30 tablet 0   Polyvinyl Alcohol-Povidone (REFRESH OP) Place 1 drop into both eyes daily as needed (redness/ dry eyes).     potassium chloride SA (KLOR-CON) 20 MEQ tablet TAKE 2 TABLETS BY MOUTH EVERY MORNING AND 1 TABLET EVERY EVENING 90 tablet 3   Pyridoxine HCl (VITAMIN B-6 PO) Take 1 tablet by mouth every morning.     ranolazine (RANEXA) 1000 MG SR tablet TAKE ONE TABLET BY MOUTH TWICE DAILY 180 tablet 3   rosuvastatin (CRESTOR) 40 MG tablet Take 1 tablet (40 mg total) by mouth at bedtime. 30 tablet 0   sacubitril-valsartan (ENTRESTO) 97-103 MG Take 1 tablet by mouth 2 (two) times daily. 60 tablet 11   spironolactone (ALDACTONE) 25 MG tablet TAKE 1 TABLET BY MOUTH DAILY 30 tablet 6   torsemide (DEMADEX) 10 MG tablet Take 1 tablet (10 mg total) by mouth daily. 90 tablet 3   traZODone (DESYREL) 50 MG tablet Take 1 tablet (50 mg total) by mouth at bedtime. 90 tablet 1   vitamin B-12 (CYANOCOBALAMIN) 1000 MCG tablet Take 1,000 mcg by mouth every morning.      zolpidem (AMBIEN) 10 MG tablet Take 10 mg by mouth at bedtime as needed for sleep.     denosumab (PROLIA) 60 MG/ML SOSY injection Inject 60 mg into the skin every 6 (six) months.  (Patient not taking: Reported on 12/29/2020)     No current facility-administered medications for this encounter.    Allergies:   Amoxicillin, Brilinta [ticagrelor], Erythromycin, Flagyl [metronidazole], Penicillins, Isosorbide mononitrate [isosorbide nitrate], Jardiance [empagliflozin], Metformin and related, Tape, and Erythromycin base   Social History:  The patient  reports that she has never smoked. She has never used smokeless tobacco. She reports that she does not drink alcohol and does not use drugs.   Family History:  The patient's family history includes AAA (abdominal aortic aneurysm) in her father and mother; Cancer in her sister; Deep vein thrombosis in her father and  mother; Diabetes in her father, paternal aunt, paternal grandmother, paternal uncle, and paternal uncle; Heart attack in her father; Heart attack (age of onset: 12) in her paternal grandfather; Heart disease in her father and paternal uncle; Hyperlipidemia in her father; Hypertension in her father, mother, and sister.   ROS:  Please see the history of present illness.   All other systems are personally reviewed and negative.   Exam:   BP (!) 100/57   Pulse (!) 50   Wt 100.2 kg (220 lb 12.8 oz)   SpO2 96%   BMI 40.38 kg/m  General:  NAD. No resp difficulty, walked into clinic with rolling walker HEENT: Normal Neck: Supple. No JVD. Carotids 2+ bilat; no bruits. No lymphadenopathy or thryomegaly appreciated. Cor: PMI nondisplaced. Regular rate & rhythm. No rubs, gallops, 2/6 SEM RUSB, audible S2 Lungs: Clear Abdomen: Obese, nontender, nondistended. No hepatosplenomegaly. No bruits or masses. Good bowel sounds. Extremities: No cyanosis, clubbing, rash, edema Neuro: Alert & oriented x 3, cranial nerves grossly intact. Moves all 4 extremities w/o difficulty. Affect pleasant.  Recent Labs: 08/10/2020: Hemoglobin 14.0; Platelets 209 11/17/2020: B Natriuretic Peptide 86.6; BUN 30; Creatinine, Ser 1.38; Potassium 4.6; Sodium 140  Personally reviewed   Wt Readings from Last 3 Encounters:  12/29/20 100.2 kg (220 lb 12.8 oz)  11/17/20 96.6 kg (213 lb)  09/23/20 94.7  kg (208 lb 12.8 oz)    ASSESSMENT AND PLAN:  1. CAD: Occluded native RCA and SVG-RCA.  Status post opening of CTO RCA with 4 overlapping Xience DES in 5/15.  DES to sequential SVG-OM1/PLOM in 9/19.  She has been on Plavix and Eliquis.  She has atypical chest pain, no change to pattern.  No ischemia on 1/21 Cardiolite.  - Continuing on Plavix (neuro has wanted her to stay on this for cerebrovascular disease).   - She has not tolerated Imdur in the past. Off Toprol XL with bradycardia.  Continue ranolazine 1000 mg bid.  2. Chronic  systolic => diastolic CHF: Echo in 7/82 with EF 55-60%, moderate diastolic dysfunction. Cardiomems placed 01/16/18.  Echo in 3/20 in setting of severe PNA with intubation showed EF 25-30%, mid-apical LV severe hypokinesis.  Possible stress (Takotsubo-type) cardiomyopathy related to severe medical illness versus progression of CAD.  She has baseline chronic LBBB which could play a role in cardiomyopathy as well (LBBB cardiomyopathy).  Repeat echo in 11/20 with EF up to 45-50%, moderately dysfunctional RV.  TEE in 12/20 with EF 50-55% with septal-lateral dyssynchrony and mildly dysfunctional RV.  Most recent echo in 5/22 with EF up to 60-65% with moderate LVH and normal RV.  NYHA class II-III symptoms. She is not volume overloaded by exam or Cardiomems. She has had orthostatic symptoms in the past, and amlodipine was recently decreased. These seem to have resolved and now appears limited mostly by weakness. - Continue torsemide 10 mg daily.  BMET/BNP today.  - Continue spironolactone 25 mg daily.  - Continue Entresto 97/103 bid. Consider decreasing dose if dizziness/falls continue. - Continue amlodipine 5 mg daily (recently decreased due to orthostatic-type symptoms.)    3. Hyperlipidemia: She remains on Zetia, Crestor and Praluent. Good lipids in 4/22.   4. Hypertension: BP low/normal today. I am hesitant to stop her amlodipine with her having recent sBP readings in 200s. - Instructed to continue to check BP at home. Notify clinic if sBP>180. Discussed return to ED precautions. 5. Cerebrovascular disease: She has history of CVA and had left carotid stent in 2/15. Followed by [facility].  Most recent MRA head showed severe stenosis of supraclinoid RICA and severe basilar artery stenosis.  - Neurology would like her to continue Plavix for this despite concomitant apixaban use.  - She has Neuro follow up soon. ? If her recent falls and dizziness are due to TIA or worsening ICA stenosis.  - Of note, she takes  alprazolam, zolpidem, methocarbamol and trazadone to sleep nightly, has been on this combination for years. Could be contributory to symptoms. We discussed this today and she will reach out to her PCP to discuss further. 6. OHS/OSA: Cannot tolerate CPAP. Uses oxygen at night. Encouraged compliance. 7. Atrial fibrillation: Paroxysmal, no recent palpitations. 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  She is in SB today.  - Continue apixaban 5 mg bid.  8. Obesity: Weight has trended down over the last year.  9. Mitral regurgitation: Mild-moderate on 5/22 echo.  10. Bradycardia: Now off Toprol XL.  HR 54 today.  11. Aortic stenosis: Mild on 5/22 echo. Today's echo results pending. 12. Mitral annular mass: This is a well-circumscribed calcified posterior mitral annulus mass noted on 5/22 echo.  This may be MAC but was significantly more prominent than in the past.   - Repeat echo today, results pending.  If no change, would continue to follow.   Followup in 3  months with Dr. Aundra [name].   Signed, Rafael Bihari, FNP  12/29/2020  [facility] [address] [facility] [place] [address] [phone] (office) [phone] (fax)

## 2020-12-23 NOTE — Addendum Note (Signed)
Addended by: Sumner Boast on: 12/23/2020 10:11 AM   Modules accepted: Orders

## 2020-12-23 NOTE — Therapy (Addendum)
[facility] [place], [place], [address] Phone: [phone]   Fax:  [phone]  Physical Therapy Treatment  Patient Details  Name: [name] [name] [name]: [id number] Date of Birth: 10/14/1945 Referring Provider (PT): Dr Loralie Champagne   Encounter Date: 12/23/2020   PT End of Session - 12/23/20 0942     Visit Number 19    Date for PT Re-Evaluation 01/22/21    PT Start Time 0934    PT Stop Time 7048    PT Time Calculation (min) 41 min             Past Medical History:  Diagnosis Date   Anemia    hx   Anxiety    Asthma    Basal cell carcinoma 05/2014   "left shoulder"   Bundle branch block, left    chronic/notes 07/18/2013   CHF (congestive heart failure) ([place])    Chronic insomnia 05/06/2015   Chronic kidney disease    frequency, sees dr Jamal Maes every 4 to 6 months (01/16/2018)   Chronic lower back pain    Claustrophobia    Common migraine 05/14/2014   Coronary artery disease    MI in 2001, 2002, 2006, 2011, 2014   Depression    Diabetic peripheral neuropathy ([place]) 01/12/2019   GERD (gastroesophageal reflux disease)    H/O hiatal hernia    Headache    "at least 2/month" (01/16/2018)   Heart murmur    Hyperlipidemia    Hypertension    Memory change 05/14/2014   Migraine    "5-6/year"  (01/16/2018)   Obesity 01-2010   Obstructive sleep apnea    "can't wear machine; I have claustrophobia" (01/16/2018), states she had a 2nd sleep study and does not have sleep apnea, her O2 decreases and now is on 2 L of O2 at night.   On home oxygen therapy    "2L at night and prn during daytime" (01/16/2018)   Osteoarthritis    "knees and hands" (01/16/2018)   Peripheral vascular disease ([place])    ? numbness, tingling arms and legs   PONV (postoperative nausea and vomiting)    Stroke ([place])    2014, 2015, 2016   Type II diabetes mellitus ([place])    Ventral hernia    hx of    Past Surgical History:   Procedure Laterality Date   ANKLE FRACTURE SURGERY Left 1970's   APPENDECTOMY  1970's   w/hysterectomy   BASAL CELL CARCINOMA EXCISION Left 05/2014   "shoulder" (01/16/2018)   CARDIAC CATHETERIZATION  10/10/2012   Dr [name].   CARDIAC CATHETERIZATION N/A 05/29/2015   Procedure: Right/Left Heart Cath and Coronary/Graft Angiography;  Surgeon: Larey Dresser, MD;  Location: [facility];  Service: Cardiovascular;  Laterality: N/A;   CAROTID ENDARTERECTOMY Left 03/2013   CAROTID STENT INSERTION Left 03/20/2013   Procedure: CAROTID STENT INSERTION;  Surgeon: [name], MD;  Location: [facility] CATH LAB;  Service: Cardiovascular;  Laterality: Left;  internal carotid   CEREBRAL ANGIOGRAM N/A 04/05/2011   Procedure: CEREBRAL ANGIOGRAM;  Surgeon: Angelia Mould, MD;  Location: [facility] CATH LAB;  Service: Cardiovascular;  Laterality: N/A;   CHOLECYSTECTOMY OPEN  2004   CORONARY ANGIOPLASTY WITH STENT PLACEMENT  01,02,05,06,07,08,11; 04/24/2013   "I've probably got ~ 10 stents by now" (04/24/2013)   CORONARY ANGIOPLASTY WITH STENT PLACEMENT  06/13/2013   "got 4 stents today" (06/13/2013)   CORONARY ARTERY BYPASS GRAFT  1220/11   "CABG  X5"   CORONARY STENT INTERVENTION N/A 10/20/2017   Procedure: CORONARY STENT INTERVENTION;  Surgeon: Troy Sine, MD;  Location: [facility];  Service: Cardiovascular;  Laterality: N/A;   ESOPHAGOGASTRODUODENOSCOPY  08/03/2011   Procedure: ESOPHAGOGASTRODUODENOSCOPY (EGD);  Surgeon: Shann Medal, MD;  Location: [facility];  Service: General;  Laterality: N/A;   ESOPHAGOGASTRODUODENOSCOPY (EGD) WITH PROPOFOL N/A 03/11/2014   Procedure: ESOPHAGOGASTRODUODENOSCOPY (EGD) WITH PROPOFOL;  Surgeon: Lafayette Dragon, MD;  Location: [facility];  Service: Endoscopy;  Laterality: N/A;   FRACTURE SURGERY     gall stone removal  05/2003   GASTRIC RESTRICTION SURGERY  1984   "stapeling"   HERNIA REPAIR  2004   "in my stomach; had OR on it twice", wire mesh on 1 hernia    LEFT HEART CATH AND CORS/GRAFTS ANGIOGRAPHY N/A 07/09/2016   Procedure: LEFT HEART CATH AND CORS/GRAFTS ANGIOGRAPHY;  Surgeon: Larey Dresser, MD;  Location: [facility];  Service: Cardiovascular;  Laterality: N/A;   ORIF ANKLE FRACTURE Left 05/16/2018   ORIF ANKLE FRACTURE Left 05/16/2018   Procedure: OPEN REDUCTION INTERNAL FIXATION (ORIF) Left ankle with possible syndesmosis fixation;  Surgeon: Nicholes Stairs, MD;  Location: [facility];  Service: Orthopedics;  Laterality: Left;  173mn   OVARY SURGERY  1970's   "tumor removed"   PERCUTANEOUS CORONARY STENT INTERVENTION (PCI-S) N/A 06/13/2013   Procedure: PERCUTANEOUS CORONARY STENT INTERVENTION (PCI-S);  Surgeon: JJettie Booze MD;  [facility];  Service: Cardiovascular;  Laterality: N/A;   PERCUTANEOUS STENT INTERVENTION N/A 04/24/2013   Procedure: PERCUTANEOUS STENT INTERVENTION;  Surgeon: JJettie Booze MD;  [facility];  Service: Cardiovascular;  Laterality: N/A;   RIGHT/LEFT HEART CATH AND CORONARY/GRAFT ANGIOGRAPHY N/A 10/20/2017   Procedure: RIGHT/LEFT HEART CATH AND CORONARY/GRAFT ANGIOGRAPHY;  Surgeon: MLarey Dresser MD;  [facility];  Service: Cardiovascular;  Laterality: N/A;   ROOT CANAL  10/2000   TEE WITHOUT CARDIOVERSION N/A 01/18/2019   Procedure: TRANSESOPHAGEAL ECHOCARDIOGRAM (TEE);  Surgeon: MLarey Dresser MD;  [facility];  Service: Cardiovascular;  Laterality: N/A;   TIBIA FRACTURE SURGERY Right 1970's   rods and pins   TOOTH EXTRACTION     "1 on the upper; wisdom tooth on the lower" (01/16/2018)   TOTAL ABDOMINAL HYSTERECTOMY  1970's   w/ appendectomy    There were no vitals filed for this visit.   Subjective Assessment - 12/23/20 0937     Subjective dizzy and tired thai morning. back is really hurting and shlds hurt too I think from leaning on rollator. denies any falls recent    Currently in Pain? Yes    Pain Score 6     Pain Location Back                 [facility] - 12/23/20 0001       Assessment   Medical Diagnosis Falls, CHF    Referring Provider (PT) Dr DLoralie Champagne                          [facility] - 12/23/20 0001       Lumbar Exercises: Aerobic   UBE (Upper Arm Bike) L 2 2 min fwd/2 min back    Nustep L 5 7 min      Lumbar Exercises: Machines for Strengthening   Cybex Lumbar Extension black tband 2 sets 10  Other Lumbar Machine Exercise 20# seated row and lat 2 sets 10      Lumbar Exercises: Seated   Sit to Stand 10 reps   from mat without UE     Knee/Hip Exercises: Machines for Strengthening   Cybex Knee Extension 10# 2 sets 12    Cybex Knee Flexion 25# 2 sets 12                       PT Short Term Goals - 11/06/20 1153       PT SHORT TERM GOAL #1   Title Patient to be independent with initial HEP.    Status Achieved               PT Long Term Goals - 12/23/20 0939       PT LONG TERM GOAL #1   Title Patient to be independent with advanced HEP.    Status Achieved      PT LONG TERM GOAL #2   Title Patient to demonstrate B LE strength >/=4+/5.    Baseline grossly 4/5 .pt also fatigues quickly    Status Partially Met      PT LONG TERM GOAL #3   Title Patient to score <14 sec on TUG testing in order to decrease risk of falls.    Status Partially Met      PT LONG TERM GOAL #4   Title Patient to score <13 sec on 5xSTS in order to decrease risk of falls.    Baseline from mat without UE 34 sec, left knee gave out 1 x    Status Partially Met      PT LONG TERM GOAL #5   Title Patient to report tolerance for 15 min of walking with LRAD without fatigue limiting.    Baseline 3-4 min per pt report    Status Partially Met                   Plan - 12/23/20 0940     Clinical Impression Statement progressing with goals and renewal done. pt has had mutli falls since [facility] but denies any recent. fatigue  ,dizziness and back pain today so adjusted ther ex as needed. TUG, STS and strength goals progressing. gait is very limited 3-4 min and LEft knee buckles at times. pt would benefit from continued PT to maximize function ( strength,gait and stamina)    PT Treatment/Interventions ADLs/Self Care Home Management;Cryotherapy;Electrical Stimulation;Iontophoresis 7m/ml Dexamethasone;Moist Heat;Gait training;Stair training;Functional mobility training;Therapeutic activities;Therapeutic exercise;Balance training;Neuromuscular re-education;Patient/family education;Manual techniques;Passive range of motion;Dry needling;Taping;Vasopneumatic Device;Joint Manipulations;Spinal Manipulations    PT Next Visit Plan renewal done. 2 x a week until mid december and then transportation will be an issue so may need to hold and resart in Jan             Patient will benefit from skilled therapeutic intervention in order to improve the following deficits and impairments:  Decreased balance, Difficulty walking, Decreased activity tolerance, Decreased strength, Pain  Visit Diagnosis: Unsteadiness on feet  Debility  Muscle weakness (generalized)  Difficulty in walking, not elsewhere classified     Problem List Patient Active Problem List   Diagnosis Date Noted   Symptomatic bradycardia 01/16/2019   Normocytic anemia 01/16/2019   Diabetic peripheral neuropathy ([place] 01/12/2019   Neurologic abnormality 12/25/2018   Debility 09/21/2018   Hypotension 09/18/2018   Bradycardia 09/18/2018   AKI (acute kidney injury) ([place] 09/18/2018   Pressure injury of skin 09/18/2018  Ischemic cerebrovascular accident (CVA) of frontal lobe ([place]) 09/13/2018   Fall 09/09/2018   History of CVA (cerebrovascular accident) 09/09/2018   Type II diabetes mellitus with renal manifestations ([place]) 09/09/2018   CKD (chronic kidney disease), stage III 09/09/2018   Sepsis ([place]) 09/09/2018   Depression with anxiety 09/09/2018   Slurred  speech 09/09/2018   Ankle fracture, left 05/16/2018   Amiodarone pulmonary toxicity    Physical deconditioning    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[place])    Acute on chronic congestive heart failure (HCC)    Chronic combined systolic (congestive) and diastolic (congestive) heart failure ([place]) 29/47/6546   Chronic systolic CHF (congestive heart failure) ([place]) 01/16/2018   Atrial fibrillation ([place]) 10/21/2017   CAD (coronary artery disease) of bypass graft 10/20/2017   Coronary artery disease of bypass graft of native heart with stable angina pectoris (HCC)    Chronic low back pain 08/24/2016   Pain of left thumb 08/05/2016   Nocturnal hypoxia 06/02/2016   Hoarseness 04/05/2016   Laryngopharyngeal reflux (LPR) 04/05/2016   Pharyngoesophageal dysphagia 04/05/2016   Angina decubitus ([place]) 05/22/2015   Chronic insomnia 05/06/2015   Common migraine 05/14/2014   Abnormality of gait 05/14/2014   Memory change 05/14/2014   Aneurysm, cerebral, nonruptured 05/14/2014   Cramp of limb-Left neck 05/10/2014   Hematemesis with nausea    Vomiting blood    Dizziness and giddiness 09/21/2013   Atypical chest pain 07/18/2013   Unstable angina ([name]) 04/09/2013   Carotid artery stenosis, symptomatic 03/20/2013   Cerebrovascular disease 07/10/2012   Cerebral artery occlusion with cerebral infarction ([place]) 07/10/2012   Mitral regurgitation 04/15/2012   TIA (transient ischemic attack) 04/15/2012   Occlusion and stenosis of carotid artery without mention of cerebral infarction 08/18/2011   Bariatric surgery status 06/17/2011   Speech abnormality 03/22/2011   Dyspnea 02/24/2011   PAPILLARY MUSCLE DYSFUNCTION, NON-RHEUMATIC 10/09/2008   UNSPECIFIED VITAMIN D DEFICIENCY 10/24/2007   MYOCARDIAL INFARCTION, HX OF 10/24/2007   PERSISTENT VOMITING 10/24/2007   OSTEOARTHRITIS 10/24/2007   MIGRAINES, HX OF 10/24/2007   Diabetes mellitus type 2, controlled, with complications ([place]) 50/35/4656   Hyperlipemia  01/16/2007   Obesity-post failed open gastroplasty 1984  01/16/2007   OBSTRUCTIVE SLEEP APNEA 01/16/2007   Essential hypertension 01/16/2007   Coronary atherosclerosis 01/16/2007   Asthma 01/16/2007   GERD 01/16/2007   VENTRAL HERNIA 01/16/2007    Sumner Boast, PT 12/23/2020, 10:10 AM  [place]. [place], [place], [address] Phone: [phone]   Fax:  [phone]  Name: [name] [name] [name]: [id number] Date of Birth: 03-Oct-1945

## 2020-12-29 ENCOUNTER — Other Ambulatory Visit (HOSPITAL_COMMUNITY): Payer: Self-pay | Admitting: Family Medicine

## 2020-12-29 ENCOUNTER — Ambulatory Visit (HOSPITAL_BASED_OUTPATIENT_CLINIC_OR_DEPARTMENT_OTHER)
Admission: RE | Admit: 2020-12-29 | Discharge: 2020-12-29 | Disposition: A | Discharge status: Home / Self Care | Payer: Medicare Other | Source: Ambulatory Visit | Attending: Family Medicine | Admitting: Family Medicine

## 2020-12-29 ENCOUNTER — Encounter (HOSPITAL_COMMUNITY): Payer: Self-pay

## 2020-12-29 ENCOUNTER — Ambulatory Visit (HOSPITAL_COMMUNITY)
Admission: RE | Admit: 2020-12-29 | Discharge: 2020-12-29 | Disposition: A | Discharge status: Home / Self Care | Payer: Medicare Other | Source: Ambulatory Visit | Attending: Family Medicine | Admitting: Family Medicine

## 2020-12-29 ENCOUNTER — Other Ambulatory Visit: Payer: Self-pay

## 2020-12-29 VITALS — BP 100/57 | HR 50 | Wt 220.8 lb

## 2020-12-29 DIAGNOSIS — I340 Nonrheumatic mitral (valve) insufficiency: Secondary | ICD-10-CM

## 2020-12-29 DIAGNOSIS — Z79899 Other long term (current) drug therapy: Secondary | ICD-10-CM | POA: Insufficient documentation

## 2020-12-29 DIAGNOSIS — N189 Chronic kidney disease, unspecified: Secondary | ICD-10-CM | POA: Insufficient documentation

## 2020-12-29 DIAGNOSIS — E785 Hyperlipidemia, unspecified: Secondary | ICD-10-CM | POA: Insufficient documentation

## 2020-12-29 DIAGNOSIS — R0789 Other chest pain: Secondary | ICD-10-CM | POA: Diagnosis not present

## 2020-12-29 DIAGNOSIS — I480 Paroxysmal atrial fibrillation: Secondary | ICD-10-CM | POA: Insufficient documentation

## 2020-12-29 DIAGNOSIS — I10 Essential (primary) hypertension: Secondary | ICD-10-CM

## 2020-12-29 DIAGNOSIS — Z7902 Long term (current) use of antithrombotics/antiplatelets: Secondary | ICD-10-CM | POA: Insufficient documentation

## 2020-12-29 DIAGNOSIS — I25119 Atherosclerotic heart disease of native coronary artery with unspecified angina pectoris: Secondary | ICD-10-CM | POA: Diagnosis not present

## 2020-12-29 DIAGNOSIS — I130 Hypertensive heart and chronic kidney disease with heart failure and stage 1 through stage 4 chronic kidney disease, or unspecified chronic kidney disease: Secondary | ICD-10-CM | POA: Diagnosis not present

## 2020-12-29 DIAGNOSIS — I447 Left bundle-branch block, unspecified: Secondary | ICD-10-CM | POA: Diagnosis not present

## 2020-12-29 DIAGNOSIS — I5042 Chronic combined systolic (congestive) and diastolic (congestive) heart failure: Secondary | ICD-10-CM

## 2020-12-29 DIAGNOSIS — E1122 Type 2 diabetes mellitus with diabetic chronic kidney disease: Secondary | ICD-10-CM | POA: Insufficient documentation

## 2020-12-29 DIAGNOSIS — G4733 Obstructive sleep apnea (adult) (pediatric): Secondary | ICD-10-CM

## 2020-12-29 DIAGNOSIS — Z7901 Long term (current) use of anticoagulants: Secondary | ICD-10-CM | POA: Diagnosis not present

## 2020-12-29 DIAGNOSIS — Z9981 Dependence on supplemental oxygen: Secondary | ICD-10-CM | POA: Insufficient documentation

## 2020-12-29 DIAGNOSIS — Z09 Encounter for follow-up examination after completed treatment for conditions other than malignant neoplasm: Secondary | ICD-10-CM | POA: Insufficient documentation

## 2020-12-29 DIAGNOSIS — I2510 Atherosclerotic heart disease of native coronary artery without angina pectoris: Secondary | ICD-10-CM | POA: Diagnosis not present

## 2020-12-29 DIAGNOSIS — I080 Rheumatic disorders of both mitral and aortic valves: Secondary | ICD-10-CM | POA: Insufficient documentation

## 2020-12-29 DIAGNOSIS — R001 Bradycardia, unspecified: Secondary | ICD-10-CM

## 2020-12-29 DIAGNOSIS — R0602 Shortness of breath: Secondary | ICD-10-CM | POA: Insufficient documentation

## 2020-12-29 DIAGNOSIS — Z8673 Personal history of transient ischemic attack (TIA), and cerebral infarction without residual deficits: Secondary | ICD-10-CM | POA: Diagnosis not present

## 2020-12-29 DIAGNOSIS — I6529 Occlusion and stenosis of unspecified carotid artery: Secondary | ICD-10-CM

## 2020-12-29 DIAGNOSIS — R5381 Other malaise: Secondary | ICD-10-CM

## 2020-12-29 DIAGNOSIS — I350 Nonrheumatic aortic (valve) stenosis: Secondary | ICD-10-CM

## 2020-12-29 DIAGNOSIS — Z951 Presence of aortocoronary bypass graft: Secondary | ICD-10-CM | POA: Insufficient documentation

## 2020-12-29 DIAGNOSIS — Z6841 Body Mass Index (BMI) 40.0 and over, adult: Secondary | ICD-10-CM | POA: Diagnosis not present

## 2020-12-29 LAB — BASIC METABOLIC PANEL
Anion gap: 7 (ref 5–15)
BUN: 31 mg/dL — ABNORMAL HIGH (ref 8–23)
CO2: 28 mmol/L (ref 22–32)
Calcium: 8.6 mg/dL — ABNORMAL LOW (ref 8.9–10.3)
Chloride: 104 mmol/L (ref 98–111)
Creatinine, Ser: 2.03 mg/dL — ABNORMAL HIGH (ref 0.44–1.00)
GFR, Estimated: 25 mL/min — ABNORMAL LOW (ref >60)
Glucose, Bld: 220 mg/dL — ABNORMAL HIGH (ref 70–99)
Potassium: 5.4 mmol/L — ABNORMAL HIGH (ref 3.5–5.1)
Sodium: 139 mmol/L (ref 135–145)

## 2020-12-29 LAB — ECHOCARDIOGRAM COMPLETE
AR max vel: 1.67 cm2
AV Area VTI: 1.6 cm2
AV Area mean vel: 1.63 cm2
AV Mean grad: 10 mmHg
AV Peak grad: 19.7 mmHg
Ao pk vel: 2.22 m/s
Area-P 1/2: 2.62 cm2
MV M vel: 4.76 m/s
MV Peak grad: 90.4 mmHg
Radius: 0.7 cm
S' Lateral: 3.7 cm

## 2020-12-29 MED ORDER — POTASSIUM CHLORIDE CRYS ER 20 MEQ PO TBCR: 20.0000 meq | EXTENDED_RELEASE_TABLET | Freq: Every day | ORAL | 3 refills | Status: DC

## 2020-12-29 MED ORDER — APIXABAN 5 MG PO TABS: 5.0000 mg | ORAL_TABLET | Freq: Two times a day (BID) | ORAL | 1 refills | Status: DC

## 2020-12-29 NOTE — Patient Instructions (Addendum)
Thank you for coming in today  Labs were done today, if any labs are abnormal the clinic will call you  PLEASE notify the clinic if your Blood pressure systolic (top) number if over 180  [phone]  Your physician recommends that you schedule a follow-up appointment in: 3 months with Dr. [name]  Office fax number [phone]  At the [facility], you and your health needs are our priority. As part of our continuing mission to provide you with exceptional heart care, we have created designated Provider Care Teams. These Care Teams include your primary Cardiologist (physician) and Advanced Practice Providers (APPs- Physician Assistants and Nurse Practitioners) who all work together to provide you with the care you need, when you need it.   You may see any of the following providers on your designated Care Team at your next follow up: Dr Glori Bickers Dr Haynes Kerns, NP Lyda Jester, [place] [facility] [employer], [place] Audry Riles, PharmD   Please be sure to bring in all your medications bottles to every appointment.   If you have any questions or concerns before your next appointment please send [place] a message through [place] or call our office at [phone].    TO LEAVE A MESSAGE FOR THE NURSE SELECT OPTION 2, PLEASE LEAVE A MESSAGE INCLUDING: YOUR NAME DATE OF BIRTH CALL BACK NUMBER REASON FOR CALL**this is important as we prioritize the call backs  YOU WILL RECEIVE A CALL BACK THE SAME DAY AS LONG AS YOU CALL BEFORE 4:00 PM

## 2020-12-29 NOTE — Progress Notes (Signed)
Echocardiogram 2D Echocardiogram has been performed.  One[place] Deputy Deshara Rossi RDCS 12/29/2020, 2:49 PM

## 2020-12-30 ENCOUNTER — Ambulatory Visit: Payer: Medicare Other | Admitting: Physical Therapy

## 2020-12-30 ENCOUNTER — Encounter: Payer: Self-pay | Admitting: Physical Therapy

## 2020-12-30 DIAGNOSIS — R2681 Unsteadiness on feet: Secondary | ICD-10-CM

## 2020-12-30 DIAGNOSIS — R262 Difficulty in walking, not elsewhere classified: Secondary | ICD-10-CM

## 2020-12-30 DIAGNOSIS — R5381 Other malaise: Secondary | ICD-10-CM

## 2020-12-30 DIAGNOSIS — M6281 Muscle weakness (generalized): Secondary | ICD-10-CM

## 2020-12-30 NOTE — Therapy (Signed)
[place] [place], [place], [address] Phone: [phone]   Fax:  [phone]  Physical Therapy Treatment  Patient Details  Name: [name] [name] [name]: [id number] Date of Birth: 1946-01-21 Referring Provider (PT): Dr Loralie Champagne   Encounter Date: 12/30/2020   PT End of Session - 12/30/20 1138     Visit Number 20    Date for PT Re-Evaluation 01/22/21    PT Start Time 1058    PT Stop Time 1140    PT Time Calculation (min) 42 min    Activity Tolerance Patient tolerated treatment well    Behavior During Therapy [facility] for tasks assessed/performed             Past Medical History:  Diagnosis Date   Anemia    hx   Anxiety    Asthma    Basal cell carcinoma 05/2014   "left shoulder"   Bundle branch block, left    chronic/notes 07/18/2013   CHF (congestive heart failure) ([place])    Chronic insomnia 05/06/2015   Chronic kidney disease    frequency, sees dr Jamal Maes every 4 to 6 months (01/16/2018)   Chronic lower back pain    Claustrophobia    Common migraine 05/14/2014   Coronary artery disease    MI in 2001, 2002, 2006, 2011, 2014   Depression    Diabetic peripheral neuropathy ([place]) 01/12/2019   GERD (gastroesophageal reflux disease)    H/O hiatal hernia    Headache    "at least 2/month" (01/16/2018)   Heart murmur    Hyperlipidemia    Hypertension    Memory change 05/14/2014   Migraine    "5-6/year"  (01/16/2018)   Obesity 01-2010   Obstructive sleep apnea    "can't wear machine; I have claustrophobia" (01/16/2018), states she had a 2nd sleep study and does not have sleep apnea, her O2 decreases and now is on 2 L of O2 at night.   On home oxygen therapy    "2L at night and prn during daytime" (01/16/2018)   Osteoarthritis    "knees and hands" (01/16/2018)   Peripheral vascular disease ([place])    ? numbness, tingling arms and legs   PONV (postoperative nausea and vomiting)    Stroke ([place])     2014, 2015, 2016   Type II diabetes mellitus ([place])    Ventral hernia    hx of    Past Surgical History:  Procedure Laterality Date   ANKLE FRACTURE SURGERY Left 1970's   APPENDECTOMY  1970's   w/hysterectomy   BASAL CELL CARCINOMA EXCISION Left 05/2014   "shoulder" (01/16/2018)   CARDIAC CATHETERIZATION  10/10/2012   Dr [name].   CARDIAC CATHETERIZATION N/A 05/29/2015   Procedure: Right/Left Heart Cath and Coronary/Graft Angiography;  Surgeon: Larey Dresser, MD;  Location: [place] CV LAB;  Service: Cardiovascular;  Laterality: N/A;   CAROTID ENDARTERECTOMY Left 03/2013   CAROTID STENT INSERTION Left 03/20/2013   Procedure: CAROTID STENT INSERTION;  Surgeon: [name], MD;  Location: [facility] CATH LAB;  Service: Cardiovascular;  Laterality: Left;  internal carotid   CEREBRAL ANGIOGRAM N/A 04/05/2011   Procedure: CEREBRAL ANGIOGRAM;  Surgeon: Angelia Mould, MD;  Location: [facility] CATH LAB;  Service: Cardiovascular;  Laterality: N/A;   CHOLECYSTECTOMY OPEN  2004   CORONARY ANGIOPLASTY WITH STENT PLACEMENT  01,02,05,06,07,08,11; 04/24/2013   "I've probably got ~ 10 stents by now" (04/24/2013)   CORONARY ANGIOPLASTY WITH STENT PLACEMENT  06/13/2013   "got 4 stents today" (06/13/2013)   CORONARY ARTERY BYPASS GRAFT  1220/11   "CABG X5"   CORONARY STENT INTERVENTION N/A 10/20/2017   Procedure: CORONARY STENT INTERVENTION;  Surgeon: Troy Sine, MD;  Location: [place] CV LAB;  Service: Cardiovascular;  Laterality: N/A;   ESOPHAGOGASTRODUODENOSCOPY  08/03/2011   Procedure: ESOPHAGOGASTRODUODENOSCOPY (EGD);  Surgeon: Shann Medal, MD;  Location[facility];  Service: General;  Laterality: N/A;   ESOPHAGOGASTRODUODENOSCOPY (EGD) WITH PROPOFOL N/A 03/11/2014   Procedure: ESOPHAGOGASTRODUODENOSCOPY (EGD) WITH PROPOFOL;  Surgeon: Lafayette Dragon, MD;  [facility]OS[facility];  Service: Endoscopy;  Laterality: N/A;   FRACTURE SURGERY     gall stone removal  05/2003   GASTRIC RESTRICTION  SURGERY  1984   "stapeling"   HERNIA REPAIR  2004   "in my stomach; had OR on it twice", wire mesh on 1 hernia   LEFT HEART CATH AND CORS/GRAFTS ANGIOGRAPHY N/A 07/09/2016   Procedure: LEFT HEART CATH AND CORS/GRAFTS ANGIOGRAPHY;  Surgeon: Larey Dresser, MD;  Location: [place] CV LAB;  Service: Cardiovascular;  Laterality: N/A;   ORIF ANKLE FRACTURE Left 05/16/2018   ORIF ANKLE FRACTURE Left 05/16/2018   Procedure: OPEN REDUCTION INTERNAL FIXATION (ORIF) Left ankle with possible syndesmosis fixation;  Surgeon: Nicholes Stairs, MD;  Location: [place];  Service: Orthopedics;  Laterality: Left;  139mn   OVARY SURGERY  1970's   "tumor removed"   PERCUTANEOUS CORONARY STENT INTERVENTION (PCI-S) N/A 06/13/2013   Procedure: PERCUTANEOUS CORONARY STENT INTERVENTION (PCI-S);  Surgeon: JJettie Booze MD;  [facility];  Service: Cardiovascular;  Laterality: N/A;   PERCUTANEOUS STENT INTERVENTION N/A 04/24/2013   Procedure: PERCUTANEOUS STENT INTERVENTION;  Surgeon: JJettie Booze MD;  [facility];  Service: Cardiovascular;  Laterality: N/A;   RIGHT/LEFT HEART CATH AND CORONARY/GRAFT ANGIOGRAPHY N/A 10/20/2017   Procedure: RIGHT/LEFT HEART CATH AND CORONARY/GRAFT ANGIOGRAPHY;  Surgeon: MLarey Dresser MD;  [facility];  Service: Cardiovascular;  Laterality: N/A;   ROOT CANAL  10/2000   TEE WITHOUT CARDIOVERSION N/A 01/18/2019   Procedure: TRANSESOPHAGEAL ECHOCARDIOGRAM (TEE);  Surgeon: MLarey Dresser MD;  [facility];  Service: Cardiovascular;  Laterality: N/A;   TIBIA FRACTURE SURGERY Right 1970's   rods and pins   TOOTH EXTRACTION     "1 on the upper; wisdom tooth on the lower" (01/16/2018)   TOTAL ABDOMINAL HYSTERECTOMY  1970's   w/ appendectomy    There were no vitals filed for this visit.   Subjective Assessment - 12/30/20 1058     Subjective Having some pain today in her back shoulder and arms. Think it is due to the rain  coming in    Currently in Pain? Yes    Pain Score 7     Pain Location Back                               [facility] Adult PT Treatment/Exercise - 12/30/20 0001       Lumbar Exercises: Aerobic   UBE (Upper Arm Bike) L 2 2 min fwd/2 min back    Nustep L 5 7 min      Lumbar Exercises: Machines for Strengthening   Cybex Lumbar Extension black tband 2 sets 10    Other Lumbar Machine Exercise 20# seated row and lat 2 sets 12      Lumbar Exercises: Seated   Sit to  Stand 20 reps   no UE     Knee/Hip Exercises: Machines for Strengthening   Cybex Knee Extension 10# 2 sets 15    Cybex Knee Flexion 25# 2 sets 15      Knee/Hip Exercises: Standing   Other Standing Knee Exercises marches 2x10 total   HHA x2                      PT Short Term Goals - 11/06/20 1153       PT SHORT TERM GOAL #1   Title Patient to be independent with initial HEP.    Status Achieved               PT Long Term Goals - 12/30/20 1139       PT LONG TERM GOAL #1   Title Patient to be independent with advanced HEP.    Status Achieved      PT LONG TERM GOAL #2   Title Patient to demonstrate B LE strength >/=4+/5.    Status Partially Met      PT LONG TERM GOAL #3   Title Patient to score <14 sec on TUG testing in order to decrease risk of falls.    Status Partially Met      PT LONG TERM GOAL #4   Title Patient to score <13 sec on 5xSTS in order to decrease risk of falls.    Baseline from mat without UE 31 sec,    Status Partially Met                   Plan - 12/30/20 1141     Clinical Impression Statement Pt continues to progress overall. Slight decrease in TUG score. Pt reports pain in multiple ares but able to complete interventions well. Increase reps tolerated with all machine level interventions. Some LE shaking with the latter reps of sit to stands in each set. Cue to lessen UE use needed with standing marches.    Personal Factors and Comorbidities  Age;Comorbidity 2    Examination-Activity Limitations Locomotion Level;Stairs    Examination-Participation Restrictions Occupation;Community Activity    Stability/Clinical Decision Making Evolving/Moderate complexity    Rehab Potential Good    PT Frequency 2x / week    PT Treatment/Interventions ADLs/Self Care Home Management;Cryotherapy;Electrical Stimulation;Iontophoresis 51m/ml Dexamethasone;Moist Heat;Gait training;Stair training;Functional mobility training;Therapeutic activities;Therapeutic exercise;Balance training;Neuromuscular re-education;Patient/family education;Manual techniques;Passive range of motion;Dry needling;Taping;Vasopneumatic Device;Joint Manipulations;Spinal Manipulations    PT Next Visit Plan 2 x a week until mid december and then transportation will be an issue so may need to hold and resart in Jan             Patient will benefit from skilled therapeutic intervention in order to improve the following deficits and impairments:  Decreased balance, Difficulty walking, Decreased activity tolerance, Decreased strength, Pain  Visit Diagnosis: Unsteadiness on feet  Muscle weakness (generalized)  Difficulty in walking, not elsewhere classified  Debility     Problem List Patient Active Problem List   Diagnosis Date Noted   Symptomatic bradycardia 01/16/2019   Normocytic anemia 01/16/2019   Diabetic peripheral neuropathy ([place] 01/12/2019   Neurologic abnormality 12/25/2018   Debility 09/21/2018   Hypotension 09/18/2018   Bradycardia 09/18/2018   AKI (acute kidney injury) ([place] 09/18/2018   Pressure injury of skin 09/18/2018   Ischemic cerebrovascular accident (CVA) of frontal lobe ([place] 09/13/2018   Fall 09/09/2018   History of CVA (cerebrovascular accident) 09/09/2018   Type II diabetes mellitus with  renal manifestations ([employer]) 09/09/2018   CKD (chronic kidney disease), stage III 09/09/2018   Sepsis ([place]) 09/09/2018   Depression with anxiety 09/09/2018    Slurred speech 09/09/2018   Ankle fracture, left 05/16/2018   Amiodarone pulmonary toxicity    Physical deconditioning    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[place])    Acute on chronic congestive heart failure (HCC)    Chronic combined systolic (congestive) and diastolic (congestive) heart failure ([place]) 94/32/7614   Chronic systolic CHF (congestive heart failure) ([place]) 01/16/2018   Atrial fibrillation ([place]) 10/21/2017   CAD (coronary artery disease) of bypass graft 10/20/2017   Coronary artery disease of bypass graft of native heart with stable angina pectoris (HCC)    Chronic low back pain 08/24/2016   Pain of left thumb 08/05/2016   Nocturnal hypoxia 06/02/2016   Hoarseness 04/05/2016   Laryngopharyngeal reflux (LPR) 04/05/2016   Pharyngoesophageal dysphagia 04/05/2016   Angina decubitus ([place]) 05/22/2015   Chronic insomnia 05/06/2015   Common migraine 05/14/2014   Abnormality of gait 05/14/2014   Memory change 05/14/2014   Aneurysm, cerebral, nonruptured 05/14/2014   Cramp of limb-Left neck 05/10/2014   Hematemesis with nausea    Vomiting blood    Dizziness and giddiness 09/21/2013   Atypical chest pain 07/18/2013   Unstable angina ([place]) 04/09/2013   Carotid artery stenosis, symptomatic 03/20/2013   Cerebrovascular disease 07/10/2012   Cerebral artery occlusion with cerebral infarction ([place]) 07/10/2012   Mitral regurgitation 04/15/2012   TIA (transient ischemic attack) 04/15/2012   Occlusion and stenosis of carotid artery without mention of cerebral infarction 08/18/2011   Bariatric surgery status 06/17/2011   Speech abnormality 03/22/2011   Dyspnea 02/24/2011   PAPILLARY MUSCLE DYSFUNCTION, NON-RHEUMATIC 10/09/2008   UNSPECIFIED VITAMIN D DEFICIENCY 10/24/2007   MYOCARDIAL INFARCTION, HX OF 10/24/2007   PERSISTENT VOMITING 10/24/2007   OSTEOARTHRITIS 10/24/2007   MIGRAINES, HX OF 10/24/2007   Diabetes mellitus type 2, controlled, with complications ([place]) 70/92/9574    Hyperlipemia 01/16/2007   Obesity-post failed open gastroplasty 1984  01/16/2007   OBSTRUCTIVE SLEEP APNEA 01/16/2007   Essential hypertension 01/16/2007   Coronary atherosclerosis 01/16/2007   Asthma 01/16/2007   GERD 01/16/2007   VENTRAL HERNIA 01/16/2007    Scot Jun, PTA 12/30/2020, 11:44 AM  [place]. [place], [place], [address] Phone: [phone]   Fax:  [phone]  Name: [name] [name] [name]: [id number] Date of Birth: 1945/11/20

## 2021-01-01 ENCOUNTER — Ambulatory Visit: Payer: Medicare Other | Attending: Cardiology | Admitting: Physical Therapy

## 2021-01-01 ENCOUNTER — Other Ambulatory Visit: Payer: Self-pay

## 2021-01-01 DIAGNOSIS — M6281 Muscle weakness (generalized): Secondary | ICD-10-CM | POA: Diagnosis present

## 2021-01-01 DIAGNOSIS — R5381 Other malaise: Secondary | ICD-10-CM | POA: Diagnosis present

## 2021-01-01 DIAGNOSIS — R262 Difficulty in walking, not elsewhere classified: Secondary | ICD-10-CM | POA: Diagnosis present

## 2021-01-01 NOTE — Therapy (Signed)
[place]. [place], [place], [address] Phone: [phone]   Fax:  [phone]  Physical Therapy Treatment  Patient Details  Name: [name] [name] [name]: [id number] Date of Birth: 05-18-1945 Referring Provider (PT): Dr Loralie Champagne   Encounter Date: 01/01/2021   PT End of Session - 01/01/21 1205     Visit Number 21    Date for PT Re-Evaluation 01/22/21    PT Start Time 1050    PT Stop Time 1761    PT Time Calculation (min) 45 min             Past Medical History:  Diagnosis Date   Anemia    hx   Anxiety    Asthma    Basal cell carcinoma 05/2014   "left shoulder"   Bundle branch block, left    chronic/notes 07/18/2013   CHF (congestive heart failure) ([place])    Chronic insomnia 05/06/2015   Chronic kidney disease    frequency, sees dr Jamal Maes every 4 to 6 months (01/16/2018)   Chronic lower back pain    Claustrophobia    Common migraine 05/14/2014   Coronary artery disease    MI in 2001, 2002, 2006, 2011, 2014   Depression    Diabetic peripheral neuropathy ([place]) 01/12/2019   GERD (gastroesophageal reflux disease)    H/O hiatal hernia    Headache    "at least 2/month" (01/16/2018)   Heart murmur    Hyperlipidemia    Hypertension    Memory change 05/14/2014   Migraine    "5-6/year"  (01/16/2018)   Obesity 01-2010   Obstructive sleep apnea    "can't wear machine; I have claustrophobia" (01/16/2018), states she had a 2nd sleep study and does not have sleep apnea, her O2 decreases and now is on 2 L of O2 at night.   On home oxygen therapy    "2L at night and prn during daytime" (01/16/2018)   Osteoarthritis    "knees and hands" (01/16/2018)   Peripheral vascular disease ([place])    ? numbness, tingling arms and legs   PONV (postoperative nausea and vomiting)    Stroke ([place])    2014, 2015, 2016   Type II diabetes mellitus ([place])    Ventral hernia    hx of    Past Surgical History:   Procedure Laterality Date   ANKLE FRACTURE SURGERY Left 1970's   APPENDECTOMY  1970's   w/hysterectomy   BASAL CELL CARCINOMA EXCISION Left 05/2014   "shoulder" (01/16/2018)   CARDIAC CATHETERIZATION  10/10/2012   Dr [name].   CARDIAC CATHETERIZATION N/A 05/29/2015   Procedure: Right/Left Heart Cath and Coronary/Graft Angiography;  Surgeon: Larey Dresser, MD;  Location: [place] CV LAB;  Service: Cardiovascular;  Laterality: N/A;   CAROTID ENDARTERECTOMY Left 03/2013   CAROTID STENT INSERTION Left 03/20/2013   Procedure: CAROTID STENT INSERTION;  Surgeon: [name], MD;  Location: [facility] CATH LAB;  Service: Cardiovascular;  Laterality: Left;  internal carotid   CEREBRAL ANGIOGRAM N/A 04/05/2011   Procedure: CEREBRAL ANGIOGRAM;  Surgeon: Angelia Mould, MD;  [facility];  Service: Cardiovascular;  Laterality: N/A;   CHOLECYSTECTOMY OPEN  2004   CORONARY ANGIOPLASTY WITH STENT PLACEMENT  01,02,05,06,07,08,11; 04/24/2013   "I've probably got ~ 10 stents by now" (04/24/2013)   CORONARY ANGIOPLASTY WITH STENT PLACEMENT  06/13/2013   "got 4 stents today" (06/13/2013)   CORONARY ARTERY BYPASS GRAFT  1220/11   "CABG  X5"   CORONARY STENT INTERVENTION N/A 10/20/2017   Procedure: CORONARY STENT INTERVENTION;  Surgeon: Troy Sine, MD;  Location: [place] CV LAB;  Service: Cardiovascular;  Laterality: N/A;   ESOPHAGOGASTRODUODENOSCOPY  08/03/2011   Procedure: ESOPHAGOGASTRODUODENOSCOPY (EGD);  Surgeon: Shann Medal, MD;  [facility];  Service: General;  Laterality: N/A;   ESOPHAGOGASTRODUODENOSCOPY (EGD) WITH PROPOFOL N/A 03/11/2014   Procedure: ESOPHAGOGASTRODUODENOSCOPY (EGD) WITH PROPOFOL;  Surgeon: Lafayette Dragon, MD;  [facility];  Service: Endoscopy;  Laterality: N/A;   FRACTURE SURGERY     gall stone removal  05/2003   GASTRIC RESTRICTION SURGERY  1984   "stapeling"   HERNIA REPAIR  2004   "in my stomach; had OR on it twice", wire mesh on 1 hernia    LEFT HEART CATH AND CORS/GRAFTS ANGIOGRAPHY N/A 07/09/2016   Procedure: LEFT HEART CATH AND CORS/GRAFTS ANGIOGRAPHY;  Surgeon: Larey Dresser, MD;  Location: [place] CV LAB;  Service: Cardiovascular;  Laterality: N/A;   ORIF ANKLE FRACTURE Left 05/16/2018   ORIF ANKLE FRACTURE Left 05/16/2018   Procedure: OPEN REDUCTION INTERNAL FIXATION (ORIF) Left ankle with possible syndesmosis fixation;  Surgeon: Nicholes Stairs, MD;  Location: [place];  Service: Orthopedics;  Laterality: Left;  141min   OVARY SURGERY  1970's   "tumor removed"   PERCUTANEOUS CORONARY STENT INTERVENTION (PCI-S) N/A 06/13/2013   Procedure: PERCUTANEOUS CORONARY STENT INTERVENTION (PCI-S);  Surgeon: [name], MD;  Location: [facility] CATH LAB;  Service: Cardiovascular;  Laterality: N/A;   PERCUTANEOUS STENT INTERVENTION N/A 04/24/2013   Procedure: PERCUTANEOUS STENT INTERVENTION;  Surgeon: [name], MD;  Location: [facility] CATH LAB;  Service: Cardiovascular;  Laterality: N/A;   RIGHT/LEFT HEART CATH AND CORONARY/GRAFT ANGIOGRAPHY N/A 10/20/2017   Procedure: RIGHT/LEFT HEART CATH AND CORONARY/GRAFT ANGIOGRAPHY;  Surgeon: Larey Dresser, MD;  Location: [place] CV LAB;  Service: Cardiovascular;  Laterality: N/A;   ROOT CANAL  10/2000   TEE WITHOUT CARDIOVERSION N/A 01/18/2019   Procedure: TRANSESOPHAGEAL ECHOCARDIOGRAM (TEE);  Surgeon: Larey Dresser, MD;  Location: [facility] ENDOSCOPY;  Service: Cardiovascular;  Laterality: N/A;   TIBIA FRACTURE SURGERY Right 1970's   rods and pins   TOOTH EXTRACTION     "1 on the upper; wisdom tooth on the lower" (01/16/2018)   TOTAL ABDOMINAL HYSTERECTOMY  1970's   w/ appendectomy    There were no vitals filed for this visit.   Subjective Assessment - 01/01/21 1059     Subjective alittle dizzy this morning. stumbles but caught herself. still pain shld and back. saw MD and he wants me to continue PT    Currently in Pain? Yes    Pain Score 7     Pain Location Back                                [facility] Adult PT Treatment/Exercise - 01/01/21 0001       Transfers   Five time sit to stand comments  26.61 sec  from standard chair without using arms   2 sets , 2 nd set much harder d/t fatigue     Lumbar Exercises: Aerobic   UBE (Upper Arm Bike) L 3 3 min fwd/3 min back    Nustep L 5 8 min      Lumbar Exercises: Machines for Strengthening   Cybex Lumbar Extension black tband 2 sets 10   flex and ext   Other  Lumbar Machine Exercise 25# seated row and lat 2 sets 10      Lumbar Exercises: Standing   Other Standing Lumbar Exercises wt ball chets press and obl CGA 10 each      Knee/Hip Exercises: Standing   Other Standing Knee Exercises hip flex,ext and abd 10 x BIL with RW green tband                       PT Short Term Goals - 11/06/20 1153       PT SHORT TERM GOAL #1   Title Patient to be independent with initial HEP.    Status Achieved               PT Long Term Goals - 12/30/20 1139       PT LONG TERM GOAL #1   Title Patient to be independent with advanced HEP.    Status Achieved      PT LONG TERM GOAL #2   Title Patient to demonstrate B LE strength >/=4+/5.    Status Partially Met      PT LONG TERM GOAL #3   Title Patient to score <14 sec on TUG testing in order to decrease risk of falls.    Status Partially Met      PT LONG TERM GOAL #4   Title Patient to score <13 sec on 5xSTS in order to decrease risk of falls.    Baseline from mat without UE 31 sec,    Status Partially Met                   Plan - 01/01/21 1205     Clinical Impression Statement pt arrived with c/o dizziness but tolerated session  well with extra time making transitions CGA. pt did well with standing ex without knee support or knee buckling. contiue to progress overalll strength and stamina    PT Treatment/Interventions ADLs/Self Care Home Management;Cryotherapy;Electrical Stimulation;Iontophoresis 59m/ml  Dexamethasone;Moist Heat;Gait training;Stair training;Functional mobility training;Therapeutic activities;Therapeutic exercise;Balance training;Neuromuscular re-education;Patient/family education;Manual techniques;Passive range of motion;Dry needling;Taping;Vasopneumatic Device;Joint Manipulations;Spinal Manipulations    PT Next Visit Plan pt reports seeing MD this week and he wants her to continue therapy. after next week but will not have transportation until Jan. Told her next week mak eappts for Jan and we will do recert when she comes back. assure she has good HEP for 3 week gap             Patient will benefit from skilled therapeutic intervention in order to improve the following deficits and impairments:  Decreased balance, Difficulty walking, Decreased activity tolerance, Decreased strength, Pain  Visit Diagnosis: Muscle weakness (generalized)  Difficulty in walking, not elsewhere classified  Debility     Problem List Patient Active Problem List   Diagnosis Date Noted   Symptomatic bradycardia 01/16/2019   Normocytic anemia 01/16/2019   Diabetic peripheral neuropathy ([place] 01/12/2019   Neurologic abnormality 12/25/2018   Debility 09/21/2018   Hypotension 09/18/2018   Bradycardia 09/18/2018   AKI (acute kidney injury) ([place] 09/18/2018   Pressure injury of skin 09/18/2018   Ischemic cerebrovascular accident (CVA) of frontal lobe ([place] 09/13/2018   Fall 09/09/2018   History of CVA (cerebrovascular accident) 09/09/2018   Type II diabetes mellitus with renal manifestations ([place] 09/09/2018   CKD (chronic kidney disease), stage III 09/09/2018   Sepsis ([place] 09/09/2018   Depression with anxiety 09/09/2018   Slurred speech 09/09/2018   Ankle fracture, left 05/16/2018  Amiodarone pulmonary toxicity    Physical deconditioning    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HCC)    Acute on chronic congestive heart failure (HCC)    Chronic combined systolic (congestive) and diastolic  (congestive) heart failure ([place]) 44/31/5400   Chronic systolic CHF (congestive heart failure) ([place]) 01/16/2018   Atrial fibrillation ([place]) 10/21/2017   CAD (coronary artery disease) of bypass graft 10/20/2017   Coronary artery disease of bypass graft of native heart with stable angina pectoris (HCC)    Chronic low back pain 08/24/2016   Pain of left thumb 08/05/2016   Nocturnal hypoxia 06/02/2016   Hoarseness 04/05/2016   Laryngopharyngeal reflux (LPR) 04/05/2016   Pharyngoesophageal dysphagia 04/05/2016   Angina decubitus ([place]) 05/22/2015   Chronic insomnia 05/06/2015   Common migraine 05/14/2014   Abnormality of gait 05/14/2014   Memory change 05/14/2014   Aneurysm, cerebral, nonruptured 05/14/2014   Cramp of limb-Left neck 05/10/2014   Hematemesis with nausea    Vomiting blood    Dizziness and giddiness 09/21/2013   Atypical chest pain 07/18/2013   Unstable angina ([place]) 04/09/2013   Carotid artery stenosis, symptomatic 03/20/2013   Cerebrovascular disease 07/10/2012   Cerebral artery occlusion with cerebral infarction ([place]) 07/10/2012   Mitral regurgitation 04/15/2012   TIA (transient ischemic attack) 04/15/2012   Occlusion and stenosis of carotid artery without mention of cerebral infarction 08/18/2011   Bariatric surgery status 06/17/2011   Speech abnormality 03/22/2011   Dyspnea 02/24/2011   PAPILLARY MUSCLE DYSFUNCTION, NON-RHEUMATIC 10/09/2008   UNSPECIFIED VITAMIN D DEFICIENCY 10/24/2007   MYOCARDIAL INFARCTION, HX OF 10/24/2007   PERSISTENT VOMITING 10/24/2007   OSTEOARTHRITIS 10/24/2007   MIGRAINES, HX OF 10/24/2007   Diabetes mellitus type 2, controlled, with complications ([place]) 86/76/1950   Hyperlipemia 01/16/2007   Obesity-post failed open gastroplasty 1984  01/16/2007   OBSTRUCTIVE SLEEP APNEA 01/16/2007   Essential hypertension 01/16/2007   Coronary atherosclerosis 01/16/2007   Asthma 01/16/2007   GERD 01/16/2007   VENTRAL HERNIA 01/16/2007     Helon Wisinski,ANGIE, PTA 01/01/2021, 12:09 PM  [place]. [place], [place], [address] Phone: [phone]   Fax:  [phone]  Name: [name] [name] [name]: [id number] Date of Birth: 1945-10-23

## 2021-01-05 ENCOUNTER — Other Ambulatory Visit (HOSPITAL_COMMUNITY): Payer: Medicare Other

## 2021-01-06 ENCOUNTER — Encounter: Payer: Self-pay | Admitting: Physical Therapy

## 2021-01-06 ENCOUNTER — Ambulatory Visit (HOSPITAL_COMMUNITY)
Admission: RE | Admit: 2021-01-06 | Discharge: 2021-01-06 | Disposition: A | Discharge status: Home / Self Care | Payer: Medicare Other | Source: Ambulatory Visit | Attending: Cardiology | Admitting: Cardiology

## 2021-01-06 ENCOUNTER — Ambulatory Visit: Payer: Medicare Other | Admitting: Physical Therapy

## 2021-01-06 ENCOUNTER — Other Ambulatory Visit: Payer: Self-pay

## 2021-01-06 ENCOUNTER — Ambulatory Visit (HOSPITAL_BASED_OUTPATIENT_CLINIC_OR_DEPARTMENT_OTHER)
Admission: RE | Admit: 2021-01-06 | Discharge: 2021-01-06 | Disposition: A | Discharge status: Home / Self Care | Payer: Medicare Other | Source: Ambulatory Visit | Attending: Family Medicine | Admitting: Family Medicine

## 2021-01-06 ENCOUNTER — Encounter (HOSPITAL_BASED_OUTPATIENT_CLINIC_OR_DEPARTMENT_OTHER): Payer: Self-pay

## 2021-01-06 ENCOUNTER — Other Ambulatory Visit (HOSPITAL_COMMUNITY): Payer: Self-pay | Admitting: *Deleted

## 2021-01-06 DIAGNOSIS — I5042 Chronic combined systolic (congestive) and diastolic (congestive) heart failure: Secondary | ICD-10-CM

## 2021-01-06 DIAGNOSIS — Z1231 Encounter for screening mammogram for malignant neoplasm of breast: Secondary | ICD-10-CM | POA: Insufficient documentation

## 2021-01-06 DIAGNOSIS — R262 Difficulty in walking, not elsewhere classified: Secondary | ICD-10-CM | POA: Diagnosis not present

## 2021-01-06 DIAGNOSIS — M6281 Muscle weakness (generalized): Secondary | ICD-10-CM | POA: Insufficient documentation

## 2021-01-06 DIAGNOSIS — R2681 Unsteadiness on feet: Secondary | ICD-10-CM | POA: Diagnosis not present

## 2021-01-06 DIAGNOSIS — R5381 Other malaise: Secondary | ICD-10-CM

## 2021-01-06 LAB — BASIC METABOLIC PANEL
Anion gap: 10 (ref 5–15)
BUN: 21 mg/dL (ref 8–23)
CO2: 24 mmol/L (ref 22–32)
Calcium: 9.3 mg/dL (ref 8.9–10.3)
Chloride: 104 mmol/L (ref 98–111)
Creatinine, Ser: 1.13 mg/dL — ABNORMAL HIGH (ref 0.44–1.00)
GFR, Estimated: 51 mL/min — ABNORMAL LOW (ref >60)
Glucose, Bld: 200 mg/dL — ABNORMAL HIGH (ref 70–99)
Potassium: 4.4 mmol/L (ref 3.5–5.1)
Sodium: 138 mmol/L (ref 135–145)

## 2021-01-06 NOTE — Therapy (Signed)
[facility] [address] [place], [place], [address] Phone: [phone]   Fax:  [phone]  Physical Therapy Treatment  Patient Details  Name: [name] [name] [name]: [id number] Date of Birth: 05/24/1945 Referring Provider (PT): Dr Dalton McLean   Encounter Date: 01/06/2021   PT End of Session - 01/06/21 1228     Visit Number 22    Date for PT Re-Evaluation 01/22/21    PT Start Time 1145    PT Stop Time 1228    PT Time Calculation (min) 43 min    Activity Tolerance Patient tolerated treatment well    Behavior During Therapy WFL for tasks assessed/performed             Past Medical History:  Diagnosis Date   Anemia    hx   Anxiety    Asthma    Basal cell carcinoma 05/2014   "left shoulder"   Bundle branch block, left    chronic/notes 07/18/2013   CHF (congestive heart failure) (HCC)    Chronic insomnia 05/06/2015   Chronic kidney disease    frequency, sees dr cynthia dunham every 4 to 6 months (01/16/2018)   Chronic lower back pain    Claustrophobia    Common migraine 05/14/2014   Coronary artery disease    MI in 2001, 2002, 2006, 2011, 2014   Depression    Diabetic peripheral neuropathy (HCC) 01/12/2019   GERD (gastroesophageal reflux disease)    H/O hiatal hernia    Headache    "at least 2/month" (01/16/2018)   Heart murmur    Hyperlipidemia    Hypertension    Memory change 05/14/2014   Migraine    "5-6/year"  (01/16/2018)   Obesity 01-2010   Obstructive sleep apnea    "can't wear machine; I have claustrophobia" (01/16/2018), states she had a 2nd sleep study and does not have sleep apnea, her O2 decreases and now is on 2 L of O2 at night.   On home oxygen therapy    "2L at night and prn during daytime" (01/16/2018)   Osteoarthritis    "knees and hands" (01/16/2018)   Peripheral vascular disease (HCC)    ? numbness, tingling arms and legs   PONV (postoperative nausea and vomiting)    Stroke (HCC)     2014, 2015, 2016   Type II diabetes mellitus (HCC)    Ventral hernia    hx of    Past Surgical History:  Procedure Laterality Date   ANKLE FRACTURE SURGERY Left 1970's   APPENDECTOMY  1970's   w/hysterectomy   BASAL CELL CARCINOMA EXCISION Left 05/2014   "shoulder" (01/16/2018)   CARDIAC CATHETERIZATION  10/10/2012   Dr McLean.   CARDIAC CATHETERIZATION N/A 05/29/2015   Procedure: Right/Left Heart Cath and Coronary/Graft Angiography;  Surgeon: Dalton S McLean, MD;  [facility];  Service: Cardiovascular;  Laterality: N/A;   CAROTID ENDARTERECTOMY Left 03/2013   CAROTID STENT INSERTION Left 03/20/2013   Procedure: CAROTID STENT INSERTION;  Surgeon: Vance W Brabham, MD;  Location: [facility] CATH [facility];  Service: Cardiovascular;  Laterality: Left;  internal carotid   CEREBRAL ANGIOGRAM N/A 04/05/2011   Procedure: CEREBRAL ANGIOGRAM;  Surgeon: Christopher S Dickson, MD;  [facility]: [facility] CATH [facility];  Service: Cardiovascular;  Laterality: N/A;   CHOLECYSTECTOMY OPEN  2004   CORONARY ANGIOPLASTY WITH STENT PLACEMENT  01,02,05,06,07,08,11; 04/24/2013   "I've probably got ~ 10 stents by now" (04/24/2013)   CORONARY ANGIOPLASTY WITH STENT PLACEMENT    06/13/2013   "got 4 stents today" (06/13/2013)   CORONARY ARTERY BYPASS GRAFT  1220/11   "CABG X5"   CORONARY STENT INTERVENTION N/A 10/20/2017   Procedure: CORONARY STENT INTERVENTION;  Surgeon: Troy Sine, MD;  Location: [facility];  Service: Cardiovascular;  Laterality: N/A;   ESOPHAGOGASTRODUODENOSCOPY  08/03/2011   Procedure: ESOPHAGOGASTRODUODENOSCOPY (EGD);  Surgeon: Shann Medal, MD;  Location: [facility];  Service: General;  Laterality: N/A;   ESOPHAGOGASTRODUODENOSCOPY (EGD) WITH PROPOFOL N/A 03/11/2014   Procedure: ESOPHAGOGASTRODUODENOSCOPY (EGD) WITH PROPOFOL;  Surgeon: Lafayette Dragon, MD;  [facility]: [facility];  Service: Endoscopy;  Laterality: N/A;   FRACTURE SURGERY     gall stone removal  05/2003   GASTRIC RESTRICTION  SURGERY  1984   "stapeling"   HERNIA REPAIR  2004   "in my stomach; had OR on it twice", wire mesh on 1 hernia   LEFT HEART CATH AND CORS/GRAFTS ANGIOGRAPHY N/A 07/09/2016   Procedure: LEFT HEART CATH AND CORS/GRAFTS ANGIOGRAPHY;  Surgeon: Larey Dresser, MD;  Location: [facility] CV LAB;  Service: Cardiovascular;  Laterality: N/A;   ORIF ANKLE FRACTURE Left 05/16/2018   ORIF ANKLE FRACTURE Left 05/16/2018   Procedure: OPEN REDUCTION INTERNAL FIXATION (ORIF) Left ankle with possible syndesmosis fixation;  Surgeon: Nicholes Stairs, MD;  Location: [facility];  Service: Orthopedics;  Laterality: Left;  113mn   OVARY SURGERY  1970's   "tumor removed"   PERCUTANEOUS CORONARY STENT INTERVENTION (PCI-S) N/A 06/13/2013   Procedure: PERCUTANEOUS CORONARY STENT INTERVENTION (PCI-S);  Surgeon: JJettie Booze MD;  [facility];  Service: Cardiovascular;  Laterality: N/A;   PERCUTANEOUS STENT INTERVENTION N/A 04/24/2013   Procedure: PERCUTANEOUS STENT INTERVENTION;  Surgeon: JJettie Booze MD;  [facility];  Service: Cardiovascular;  Laterality: N/A;   RIGHT/LEFT HEART CATH AND CORONARY/GRAFT ANGIOGRAPHY N/A 10/20/2017   Procedure: RIGHT/LEFT HEART CATH AND CORONARY/GRAFT ANGIOGRAPHY;  Surgeon: MLarey Dresser MD;  [facility];  Service: Cardiovascular;  Laterality: N/A;   ROOT CANAL  10/2000   TEE WITHOUT CARDIOVERSION N/A 01/18/2019   Procedure: TRANSESOPHAGEAL ECHOCARDIOGRAM (TEE);  Surgeon: MLarey Dresser MD;  [facility];  Service: Cardiovascular;  Laterality: N/A;   TIBIA FRACTURE SURGERY Right 1970's   rods and pins   TOOTH EXTRACTION     "1 on the upper; wisdom tooth on the lower" (01/16/2018)   TOTAL ABDOMINAL HYSTERECTOMY  1970's   w/ appendectomy    There were no vitals filed for this visit.   Subjective Assessment - 01/06/21 1145     Subjective "Im good" stumbles but no falls    Currently in Pain? Yes    Pain Score 8      Pain Location Back    Pain Orientation Lower                               [facility] Adult PT Treatment/Exercise - 01/06/21 0001       Lumbar Exercises: Aerobic   Nustep L 5 8 min      Lumbar Exercises: Machines for Strengthening   Cybex Lumbar Extension black tband 2 sets 12    Other Lumbar Machine Exercise 25# seated row and lat 2 sets 12      Knee/Hip Exercises: Machines for Strengthening   Cybex Knee Extension 10# 2 sets 15    Cybex Knee Flexion 25# 2 sets 15  Knee/Hip Exercises: Seated   Sit to Sand 2 sets;10 reps                       PT Short Term Goals - 01/06/21 1228       PT SHORT TERM GOAL #1   Title Patient to be independent with initial HEP.    Status Achieved               PT Long Term Goals - 01/06/21 1229       PT LONG TERM GOAL #1   Title Patient to be independent with advanced HEP.    Status Achieved      PT LONG TERM GOAL #2   Title Patient to demonstrate B LE strength >/=4+/5.    Status Partially Met      PT LONG TERM GOAL #3   Title Patient to score <14 sec on TUG testing in order to decrease risk of falls.    Status Partially Met                   Plan - 01/06/21 1229     Clinical Impression Statement Pt enters clinic reporting increase low back pain. She stated that the weather does not help. L knee shaking noted with sit to stands. Tactile cue needed to L knee during SLS phase of standing ing marches for security. increase fatigue noted with activity.    Personal Factors and Comorbidities Age;Comorbidity 2    Comorbidities CHF, Pneumonia    Examination-Activity Limitations Locomotion Level;Stairs    Examination-Participation Restrictions Occupation;Community Activity    PT Next Visit Plan pt reports seeing MD this week and he wants her to continue therapy. after next week but will not have transportation until Jan. Told her next week mak eappts for Jan and we will do recert when she comes  back.             Patient will benefit from skilled therapeutic intervention in order to improve the following deficits and impairments:  Decreased balance, Difficulty walking, Decreased activity tolerance, Decreased strength, Pain  Visit Diagnosis: Muscle weakness (generalized)  Difficulty in walking, not elsewhere classified  Debility     Problem List Patient Active Problem List   Diagnosis Date Noted   Symptomatic bradycardia 01/16/2019   Normocytic anemia 01/16/2019   Diabetic peripheral neuropathy ([place]) 01/12/2019   Neurologic abnormality 12/25/2018   Debility 09/21/2018   Hypotension 09/18/2018   Bradycardia 09/18/2018   AKI (acute kidney injury) ([place]) 09/18/2018   Pressure injury of skin 09/18/2018   Ischemic cerebrovascular accident (CVA) of frontal lobe ([place]) 09/13/2018   Fall 09/09/2018   History of CVA (cerebrovascular accident) 09/09/2018   Type II diabetes mellitus with renal manifestations ([place]) 09/09/2018   CKD (chronic kidney disease), stage III 09/09/2018   Sepsis ([place]) 09/09/2018   Depression with anxiety 09/09/2018   Slurred speech 09/09/2018   Ankle fracture, left 05/16/2018   Amiodarone pulmonary toxicity    Physical deconditioning    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[place])    Acute on chronic congestive heart failure (HCC)    Chronic combined systolic (congestive) and diastolic (congestive) heart failure ([place]) 40/34/7425   Chronic systolic CHF (congestive heart failure) ([place]) 01/16/2018   Atrial fibrillation ([place]) 10/21/2017   CAD (coronary artery disease) of bypass graft 10/20/2017   Coronary artery disease of bypass graft of native heart with stable angina pectoris ([place])    Chronic low back pain 08/24/2016  Pain of left thumb 08/05/2016   Nocturnal hypoxia 06/02/2016   Hoarseness 04/05/2016   Laryngopharyngeal reflux (LPR) 04/05/2016   Pharyngoesophageal dysphagia 04/05/2016   Angina decubitus (HCC) 05/22/2015   Chronic insomnia 05/06/2015    Common migraine 05/14/2014   Abnormality of gait 05/14/2014   Memory change 05/14/2014   Aneurysm, cerebral, nonruptured 05/14/2014   Cramp of limb-Left neck 05/10/2014   Hematemesis with nausea    Vomiting blood    Dizziness and giddiness 09/21/2013   Atypical chest pain 07/18/2013   Unstable angina (HCC) 04/09/2013   Carotid artery stenosis, symptomatic 03/20/2013   Cerebrovascular disease 07/10/2012   Cerebral artery occlusion with cerebral infarction (HCC) 07/10/2012   Mitral regurgitation 04/15/2012   TIA (transient ischemic attack) 04/15/2012   Occlusion and stenosis of carotid artery without mention of cerebral infarction 08/18/2011   Bariatric surgery status 06/17/2011   Speech abnormality 03/22/2011   Dyspnea 02/24/2011   PAPILLARY MUSCLE DYSFUNCTION, NON-RHEUMATIC 10/09/2008   UNSPECIFIED VITAMIN D DEFICIENCY 10/24/2007   MYOCARDIAL INFARCTION, HX OF 10/24/2007   PERSISTENT VOMITING 10/24/2007   OSTEOARTHRITIS 10/24/2007   MIGRAINES, HX OF 10/24/2007   Diabetes mellitus type 2, controlled, with complications (HCC) 01/16/2007   Hyperlipemia 01/16/2007   Obesity-post failed open gastroplasty 1984  01/16/2007   OBSTRUCTIVE SLEEP APNEA 01/16/2007   Essential hypertension 01/16/2007   Coronary atherosclerosis 01/16/2007   Asthma 01/16/2007   GERD 01/16/2007   VENTRAL HERNIA 01/16/2007     G , PTA 01/06/2021, 12:32 PM  [facility] [address] [place], [place], [address] Phone: [phone]   Fax:  [phone]  Name: [name] [name] [name]: [id number] Date of Birth: 06/04/1945    

## 2021-01-08 ENCOUNTER — Ambulatory Visit: Payer: Medicare Other | Admitting: Physical Therapy

## 2021-01-09 ENCOUNTER — Other Ambulatory Visit (HOSPITAL_COMMUNITY): Payer: Self-pay | Admitting: *Deleted

## 2021-01-09 MED ORDER — ENTRESTO 97-103 MG PO TABS: 1.0000 | ORAL_TABLET | Freq: Two times a day (BID) | ORAL | 3 refills | Status: DC

## 2021-01-17 ENCOUNTER — Other Ambulatory Visit (HOSPITAL_COMMUNITY): Payer: Self-pay | Admitting: Cardiology

## 2021-01-20 ENCOUNTER — Telehealth (HOSPITAL_COMMUNITY): Payer: Self-pay | Admitting: Pharmacy Technician

## 2021-01-20 NOTE — Telephone Encounter (Signed)
Advanced Heart Failure Patient Advocate Encounter  Sent in provider's portion of [employer] assistance application via fax. Patient reportedly sent her's in already.   Will follow up.

## 2021-02-03 ENCOUNTER — Ambulatory Visit: Payer: Medicare Other | Attending: Cardiology | Admitting: Physical Therapy

## 2021-02-03 ENCOUNTER — Other Ambulatory Visit: Payer: Self-pay

## 2021-02-03 DIAGNOSIS — R5381 Other malaise: Secondary | ICD-10-CM | POA: Diagnosis present

## 2021-02-03 DIAGNOSIS — R2681 Unsteadiness on feet: Secondary | ICD-10-CM | POA: Insufficient documentation

## 2021-02-03 DIAGNOSIS — M6281 Muscle weakness (generalized): Secondary | ICD-10-CM | POA: Diagnosis present

## 2021-02-03 DIAGNOSIS — R262 Difficulty in walking, not elsewhere classified: Secondary | ICD-10-CM | POA: Diagnosis present

## 2021-02-03 NOTE — Therapy (Signed)
[facility] [place], [place], [address] Phone: [phone]   Fax:  [phone]  Physical Therapy Treatment  Patient Details  Name: [name] [name] [name]: [id number] Date of Birth: Aug 17, 1945 Referring Provider (PT): Dr Loralie Champagne   Encounter Date: 02/03/2021   PT End of Session - 02/03/21 1200     Visit Number 23    Date for PT Re-Evaluation 03/06/21    PT Start Time 1145    PT Stop Time 7062    PT Time Calculation (min) 50 min             Past Medical History:  Diagnosis Date   Anemia    hx   Anxiety    Asthma    Basal cell carcinoma 05/2014   "left shoulder"   Bundle branch block, left    chronic/notes 07/18/2013   CHF (congestive heart failure) ([place])    Chronic insomnia 05/06/2015   Chronic kidney disease    frequency, sees dr Jamal Maes every 4 to 6 months (01/16/2018)   Chronic lower back pain    Claustrophobia    Common migraine 05/14/2014   Coronary artery disease    MI in 2001, 2002, 2006, 2011, 2014   Depression    Diabetic peripheral neuropathy ([place]) 01/12/2019   GERD (gastroesophageal reflux disease)    H/O hiatal hernia    Headache    "at least 2/month" (01/16/2018)   Heart murmur    Hyperlipidemia    Hypertension    Memory change 05/14/2014   Migraine    "5-6/year"  (01/16/2018)   Obesity 01-2010   Obstructive sleep apnea    "can't wear machine; I have claustrophobia" (01/16/2018), states she had a 2nd sleep study and does not have sleep apnea, her O2 decreases and now is on 2 L of O2 at night.   On home oxygen therapy    "2L at night and prn during daytime" (01/16/2018)   Osteoarthritis    "knees and hands" (01/16/2018)   Peripheral vascular disease ([place])    ? numbness, tingling arms and legs   PONV (postoperative nausea and vomiting)    Stroke ([place])    2014, 2015, 2016   Type II diabetes mellitus ([place])    Ventral hernia    hx of    Past Surgical History:   Procedure Laterality Date   ANKLE FRACTURE SURGERY Left 1970's   APPENDECTOMY  1970's   w/hysterectomy   BASAL CELL CARCINOMA EXCISION Left 05/2014   "shoulder" (01/16/2018)   CARDIAC CATHETERIZATION  10/10/2012   Dr [name].   CARDIAC CATHETERIZATION N/A 05/29/2015   Procedure: Right/Left Heart Cath and Coronary/Graft Angiography;  Surgeon: Larey Dresser, MD;  Location: [facility] CV LAB;  Service: Cardiovascular;  Laterality: N/A;   CAROTID ENDARTERECTOMY Left 03/2013   CAROTID STENT INSERTION Left 03/20/2013   Procedure: CAROTID STENT INSERTION;  Surgeon: [name], MD;  Location: [facility] CATH LAB;  Service: Cardiovascular;  Laterality: Left;  internal carotid   CEREBRAL ANGIOGRAM N/A 04/05/2011   Procedure: CEREBRAL ANGIOGRAM;  Surgeon: Angelia Mould, MD;  Location: [facility] CATH LAB;  Service: Cardiovascular;  Laterality: N/A;   CHOLECYSTECTOMY OPEN  2004   CORONARY ANGIOPLASTY WITH STENT PLACEMENT  01,02,05,06,07,08,11; 04/24/2013   "I've probably got ~ 10 stents by now" (04/24/2013)   CORONARY ANGIOPLASTY WITH STENT PLACEMENT  06/13/2013   "got 4 stents today" (06/13/2013)   CORONARY ARTERY BYPASS GRAFT  1220/11   "CABG  X5"   CORONARY STENT INTERVENTION N/A 10/20/2017   Procedure: CORONARY STENT INTERVENTION;  Surgeon: Troy Sine, MD;  Location: [facility] CV LAB;  Service: Cardiovascular;  Laterality: N/A;   ESOPHAGOGASTRODUODENOSCOPY  08/03/2011   Procedure: ESOPHAGOGASTRODUODENOSCOPY (EGD);  Surgeon: Shann Medal, MD;  Location: [facility];  Service: General;  Laterality: N/A;   ESOPHAGOGASTRODUODENOSCOPY (EGD) WITH PROPOFOL N/A 03/11/2014   Procedure: ESOPHAGOGASTRODUODENOSCOPY (EGD) WITH PROPOFOL;  Surgeon: Lafayette Dragon, MD;  [facility];  Service: Endoscopy;  Laterality: N/A;   FRACTURE SURGERY     gall stone removal  05/2003   GASTRIC RESTRICTION SURGERY  1984   "stapeling"   HERNIA REPAIR  2004   "in my stomach; had OR on it twice", wire mesh on 1 hernia    LEFT HEART CATH AND CORS/GRAFTS ANGIOGRAPHY N/A 07/09/2016   Procedure: LEFT HEART CATH AND CORS/GRAFTS ANGIOGRAPHY;  Surgeon: Larey Dresser, MD;  Location: [facility] CV LAB;  Service: Cardiovascular;  Laterality: N/A;   ORIF ANKLE FRACTURE Left 05/16/2018   ORIF ANKLE FRACTURE Left 05/16/2018   Procedure: OPEN REDUCTION INTERNAL FIXATION (ORIF) Left ankle with possible syndesmosis fixation;  Surgeon: Nicholes Stairs, MD;  Location: [facility];  Service: Orthopedics;  Laterality: Left;  115min   OVARY SURGERY  1970's   "tumor removed"   PERCUTANEOUS CORONARY STENT INTERVENTION (PCI-S) N/A 06/13/2013   Procedure: PERCUTANEOUS CORONARY STENT INTERVENTION (PCI-S);  Surgeon: [name], MD;  Location: [facility] CATH LAB;  Service: Cardiovascular;  Laterality: N/A;   PERCUTANEOUS STENT INTERVENTION N/A 04/24/2013   Procedure: PERCUTANEOUS STENT INTERVENTION;  Surgeon: [name], MD;  Location[facility] CATH LAB;  Service: Cardiovascular;  Laterality: N/A;   RIGHT/LEFT HEART CATH AND CORONARY/GRAFT ANGIOGRAPHY N/A 10/20/2017   Procedure: RIGHT/LEFT HEART CATH AND CORONARY/GRAFT ANGIOGRAPHY;  Surgeon: Larey Dresser, MD;  Location: [facility] CV LAB;  Service: Cardiovascular;  Laterality: N/A;   ROOT CANAL  10/2000   TEE WITHOUT CARDIOVERSION N/A 01/18/2019   Procedure: TRANSESOPHAGEAL ECHOCARDIOGRAM (TEE);  Surgeon: Larey Dresser, MD;  Location: [facility] ENDOSCOPY;  Service: Cardiovascular;  Laterality: N/A;   TIBIA FRACTURE SURGERY Right 1970's   rods and pins   TOOTH EXTRACTION     "1 on the upper; wisdom tooth on the lower" (01/16/2018)   TOTAL ABDOMINAL HYSTERECTOMY  1970's   w/ appendectomy    There were no vitals filed for this visit.   Subjective Assessment - 02/03/21 1150     Subjective lack of tranportation and OOT so have not been here. ready to get back to work. denies any falls. Worked on my ex while gone    Currently in Pain? Yes    Pain Score 5     Pain Location Back                                [facility] Adult PT Treatment/Exercise - 02/03/21 0001       Lumbar Exercises: Aerobic   UBE (Upper Arm Bike) L 3 2 min fwd/74min back    Nustep L 5 7 min      Lumbar Exercises: Machines for Strengthening   Cybex Lumbar Extension black tband 2 sets 10    Other Lumbar Machine Exercise 25# seated row and lat 2 sets 10      Knee/Hip Exercises: Machines for Strengthening   Cybex Knee Extension 10# 2 sets 10    Cybex Knee Flexion 25#  2 sets 10      Knee/Hip Exercises: Seated   Sit to Sand 1 set;10 reps;without UE support   wt ball chest press                      PT Short Term Goals - 01/06/21 1228       PT SHORT TERM GOAL #1   Title Patient to be independent with initial HEP.    Status Achieved               PT Long Term Goals - 02/03/21 1153       PT LONG TERM GOAL #1   Title Patient to be independent with advanced HEP.    Status Achieved      PT LONG TERM GOAL #2   Title Patient to demonstrate B LE strength >/=4+/5.    Baseline grossly 4/5 .pt also fatigues quickly    Status Partially Met      PT LONG TERM GOAL #3   Title Patient to score <14 sec on TUG testing in order to decrease risk of falls.    Baseline 15.25 sec with RW    Status Partially Met      PT LONG TERM GOAL #4   Title Patient to score <13 sec on 5xSTS in order to decrease risk of falls.    Baseline from mat without UE 19.41 sec with a couple LOB    Status Partially Met      PT LONG TERM GOAL #5   Title Patient to report tolerance for 15 min of walking with LRAD without fatigue limiting.    Status Partially Met                   Plan - 02/03/21 1200     Clinical Impression Statement pt resumes PT after 1 month as she was OOT for holidays and had transportation issues. pt states she has been walking and doing HEP. Denies any falls Pt has not met all goals but is progressing adn minimal regression in 1 month - mostly just SOB with  ex interventons adn decreased stability. eased back into ex as it had been a month    PT Frequency 2x / week    PT Duration 4 weeks    PT Treatment/Interventions ADLs/Self Care Home Management;Cryotherapy;Electrical Stimulation;Iontophoresis 6m/ml Dexamethasone;Moist Heat;Gait training;Stair training;Functional mobility training;Therapeutic activities;Therapeutic exercise;Balance training;Neuromuscular re-education;Patient/family education;Manual techniques;Passive range of motion;Dry needling;Taping;Vasopneumatic Device;Joint Manipulations;Spinal Manipulations    PT Next Visit Plan Renewal Done             Patient will benefit from skilled therapeutic intervention in order to improve the following deficits and impairments:  Decreased balance, Difficulty walking, Decreased activity tolerance, Decreased strength, Pain  Visit Diagnosis: Muscle weakness (generalized)  Difficulty in walking, not elsewhere classified  Debility  Unsteadiness on feet     Problem List Patient Active Problem List   Diagnosis Date Noted   Symptomatic bradycardia 01/16/2019   Normocytic anemia 01/16/2019   Diabetic peripheral neuropathy ([place] 01/12/2019   Neurologic abnormality 12/25/2018   Debility 09/21/2018   Hypotension 09/18/2018   Bradycardia 09/18/2018   AKI (acute kidney injury) ([place] 09/18/2018   Pressure injury of skin 09/18/2018   Ischemic cerebrovascular accident (CVA) of frontal lobe ([place] 09/13/2018   Fall 09/09/2018   History of CVA (cerebrovascular accident) 09/09/2018   Type II diabetes mellitus with renal manifestations ([place] 09/09/2018   CKD (chronic kidney disease), stage III 09/09/2018  Sepsis ([place]) 09/09/2018   Depression with anxiety 09/09/2018   Slurred speech 09/09/2018   Ankle fracture, left 05/16/2018   Amiodarone pulmonary toxicity    Physical deconditioning    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[place])    Acute on chronic congestive heart failure (HCC)    Chronic combined  systolic (congestive) and diastolic (congestive) heart failure ([place]) 67/89/3810   Chronic systolic CHF (congestive heart failure) ([place]) 01/16/2018   Atrial fibrillation ([place]) 10/21/2017   CAD (coronary artery disease) of bypass graft 10/20/2017   Coronary artery disease of bypass graft of native heart with stable angina pectoris (HCC)    Chronic low back pain 08/24/2016   Pain of left thumb 08/05/2016   Nocturnal hypoxia 06/02/2016   Hoarseness 04/05/2016   Laryngopharyngeal reflux (LPR) 04/05/2016   Pharyngoesophageal dysphagia 04/05/2016   Angina decubitus ([place]) 05/22/2015   Chronic insomnia 05/06/2015   Common migraine 05/14/2014   Abnormality of gait 05/14/2014   Memory change 05/14/2014   Aneurysm, cerebral, nonruptured 05/14/2014   Cramp of limb-Left neck 05/10/2014   Hematemesis with nausea    Vomiting blood    Dizziness and giddiness 09/21/2013   Atypical chest pain 07/18/2013   Unstable angina ([place]) 04/09/2013   Carotid artery stenosis, symptomatic 03/20/2013   Cerebrovascular disease 07/10/2012   Cerebral artery occlusion with cerebral infarction ([place]) 07/10/2012   Mitral regurgitation 04/15/2012   TIA (transient ischemic attack) 04/15/2012   Occlusion and stenosis of carotid artery without mention of cerebral infarction 08/18/2011   Bariatric surgery status 06/17/2011   Speech abnormality 03/22/2011   Dyspnea 02/24/2011   PAPILLARY MUSCLE DYSFUNCTION, NON-RHEUMATIC 10/09/2008   UNSPECIFIED VITAMIN D DEFICIENCY 10/24/2007   MYOCARDIAL INFARCTION, HX OF 10/24/2007   PERSISTENT VOMITING 10/24/2007   OSTEOARTHRITIS 10/24/2007   MIGRAINES, HX OF 10/24/2007   Diabetes mellitus type 2, controlled, with complications ([place]) 17/51/0258   Hyperlipemia 01/16/2007   Obesity-post failed open gastroplasty 1984  01/16/2007   OBSTRUCTIVE SLEEP APNEA 01/16/2007   Essential hypertension 01/16/2007   Coronary atherosclerosis 01/16/2007   Asthma 01/16/2007   GERD 01/16/2007    VENTRAL HERNIA 01/16/2007    Hunt Oris, PT 02/03/2021, 12:59 PM  [facility]. [place], [place], [address] Phone: [phone]   Fax:  [phone]  Name: IMONIE TUCH [name]: [id number] Date of Birth: 07-03-1945

## 2021-02-04 ENCOUNTER — Ambulatory Visit (HOSPITAL_COMMUNITY): Admission: RE | Admit: 2021-02-04 | Payer: Medicare Other | Source: Ambulatory Visit

## 2021-02-05 ENCOUNTER — Other Ambulatory Visit: Payer: Self-pay

## 2021-02-05 ENCOUNTER — Ambulatory Visit: Payer: Medicare Other | Admitting: Physical Therapy

## 2021-02-05 DIAGNOSIS — M6281 Muscle weakness (generalized): Secondary | ICD-10-CM | POA: Diagnosis not present

## 2021-02-05 DIAGNOSIS — R2681 Unsteadiness on feet: Secondary | ICD-10-CM

## 2021-02-05 DIAGNOSIS — R262 Difficulty in walking, not elsewhere classified: Secondary | ICD-10-CM

## 2021-02-05 NOTE — Therapy (Signed)
[place] [place]. [place], [place], [address] Phone: [phone]   Fax:  [phone]  Physical Therapy Treatment  Patient Details  Name: [name] [name] [name]: [id number] Date of Birth: Nov 12, 1945 Referring Provider (PT): Dr Loralie Champagne   Encounter Date: 02/05/2021   PT End of Session - 02/05/21 1224     Visit Number 24    Date for PT Re-Evaluation 03/06/21    PT Start Time 1145    PT Stop Time 1228    PT Time Calculation (min) 43 min             Past Medical History:  Diagnosis Date   Anemia    hx   Anxiety    Asthma    Basal cell carcinoma 05/2014   "left shoulder"   Bundle branch block, left    chronic/notes 07/18/2013   CHF (congestive heart failure) ([place])    Chronic insomnia 05/06/2015   Chronic kidney disease    frequency, sees dr Jamal Maes every 4 to 6 months (01/16/2018)   Chronic lower back pain    Claustrophobia    Common migraine 05/14/2014   Coronary artery disease    MI in 2001, 2002, 2006, 2011, 2014   Depression    Diabetic peripheral neuropathy ([place]) 01/12/2019   GERD (gastroesophageal reflux disease)    H/O hiatal hernia    Headache    "at least 2/month" (01/16/2018)   Heart murmur    Hyperlipidemia    Hypertension    Memory change 05/14/2014   Migraine    "5-6/year"  (01/16/2018)   Obesity 01-2010   Obstructive sleep apnea    "can't wear machine; I have claustrophobia" (01/16/2018), states she had a 2nd sleep study and does not have sleep apnea, her O2 decreases and now is on 2 L of O2 at night.   On home oxygen therapy    "2L at night and prn during daytime" (01/16/2018)   Osteoarthritis    "knees and hands" (01/16/2018)   Peripheral vascular disease ([name])    ? numbness, tingling arms and legs   PONV (postoperative nausea and vomiting)    Stroke ([place])    2014, 2015, 2016   Type II diabetes mellitus ([place])    Ventral hernia    hx of    Past Surgical History:   Procedure Laterality Date   ANKLE FRACTURE SURGERY Left 1970's   APPENDECTOMY  1970's   w/hysterectomy   BASAL CELL CARCINOMA EXCISION Left 05/2014   "shoulder" (01/16/2018)   CARDIAC CATHETERIZATION  10/10/2012   Dr [name].   CARDIAC CATHETERIZATION N/A 05/29/2015   Procedure: Right/Left Heart Cath and Coronary/Graft Angiography;  Surgeon: Larey Dresser, MD;  Location: [facility] LAB;  Service: Cardiovascular;  Laterality: N/A;   CAROTID ENDARTERECTOMY Left 03/2013   CAROTID STENT INSERTION Left 03/20/2013   Procedure: CAROTID STENT INSERTION;  Surgeon: [name], MD;  Location: [facility] CATH LAB;  Service: Cardiovascular;  Laterality: Left;  internal carotid   CEREBRAL ANGIOGRAM N/A 04/05/2011   Procedure: CEREBRAL ANGIOGRAM;  Surgeon: Angelia Mould, MD;  Location: [facility] CATH LAB;  Service: Cardiovascular;  Laterality: N/A;   CHOLECYSTECTOMY OPEN  2004   CORONARY ANGIOPLASTY WITH STENT PLACEMENT  01,02,05,06,07,08,11; 04/24/2013   "I've probably got ~ 10 stents by now" (04/24/2013)   CORONARY ANGIOPLASTY WITH STENT PLACEMENT  06/13/2013   "got 4 stents today" (06/13/2013)   CORONARY ARTERY BYPASS GRAFT  1220/11   "CABG  X5"   CORONARY STENT INTERVENTION N/A 10/20/2017   Procedure: CORONARY STENT INTERVENTION;  Surgeon: Troy Sine, MD;  Location: [place] [facility] CV LAB;  Service: Cardiovascular;  Laterality: N/A;   ESOPHAGOGASTRODUODENOSCOPY  08/03/2011   Procedure: ESOPHAGOGASTRODUODENOSCOPY (EGD);  Surgeon: Shann Medal, MD;  [facility];  Service: General;  Laterality: N/A;   ESOPHAGOGASTRODUODENOSCOPY (EGD) WITH PROPOFOL N/A 03/11/2014   Procedure: ESOPHAGOGASTRODUODENOSCOPY (EGD) WITH PROPOFOL;  Surgeon: Lafayette Dragon, MD;  [facility]: [facility];  Service: Endoscopy;  Laterality: N/A;   FRACTURE SURGERY     gall stone removal  05/2003   GASTRIC RESTRICTION SURGERY  1984   "stapeling"   HERNIA REPAIR  2004   "in my stomach; had OR on it twice", wire mesh on 1 hernia    LEFT HEART CATH AND CORS/GRAFTS ANGIOGRAPHY N/A 07/09/2016   Procedure: LEFT HEART CATH AND CORS/GRAFTS ANGIOGRAPHY;  Surgeon: Larey Dresser, MD;  Location: [place] CV LAB;  Service: Cardiovascular;  Laterality: N/A;   ORIF ANKLE FRACTURE Left 05/16/2018   ORIF ANKLE FRACTURE Left 05/16/2018   Procedure: OPEN REDUCTION INTERNAL FIXATION (ORIF) Left ankle with possible syndesmosis fixation;  Surgeon: Nicholes Stairs, MD;  Location: [facility];  Service: Orthopedics;  Laterality: Left;  126mn   OVARY SURGERY  1970's   "tumor removed"   PERCUTANEOUS CORONARY STENT INTERVENTION (PCI-S) N/A 06/13/2013   Procedure: PERCUTANEOUS CORONARY STENT INTERVENTION (PCI-S);  Surgeon: JJettie Booze MD;  [facility];  Service: Cardiovascular;  Laterality: N/A;   PERCUTANEOUS STENT INTERVENTION N/A 04/24/2013   Procedure: PERCUTANEOUS STENT INTERVENTION;  Surgeon: JJettie Booze MD;  [facility];  Service: Cardiovascular;  Laterality: N/A;   RIGHT/LEFT HEART CATH AND CORONARY/GRAFT ANGIOGRAPHY N/A 10/20/2017   Procedure: RIGHT/LEFT HEART CATH AND CORONARY/GRAFT ANGIOGRAPHY;  Surgeon: MLarey Dresser MD;  [facility];  Service: Cardiovascular;  Laterality: N/A;   ROOT CANAL  10/2000   TEE WITHOUT CARDIOVERSION N/A 01/18/2019   Procedure: TRANSESOPHAGEAL ECHOCARDIOGRAM (TEE);  Surgeon: MLarey Dresser MD;  [facility];  Service: Cardiovascular;  Laterality: N/A;   TIBIA FRACTURE SURGERY Right 1970's   rods and pins   TOOTH EXTRACTION     "1 on the upper; wisdom tooth on the lower" (01/16/2018)   TOTAL ABDOMINAL HYSTERECTOMY  1970's   w/ appendectomy    There were no vitals filed for this visit.   Subjective Assessment - 02/05/21 1148     Subjective alittle sore after last session. back( 8) and shlds(4) bothering me this morning. very SOB- MRI 1/17    Currently in Pain? Yes    Pain Score 8     Pain Location Back                                [facility] Adult PT Treatment/Exercise - 02/05/21 0001       Lumbar Exercises: Aerobic   Nustep L 6 7 min   decreased to level 5 after 4 min     Lumbar Exercises: Standing   Row Strengthening;Both;10 reps;Theraband    Theraband Level (Row) Level 3 (Green)    Shoulder Extension Strengthening;Both;10 reps;Theraband    Theraband Level (Shoulder Extension) Level 3 (Green)      Lumbar Exercises: Seated   Sit to Stand 10 reps   with wt ball press   Other Seated Lumbar Exercises trunk flex and ext 15 x black tbnad  Knee/Hip Exercises: Standing   Hip Flexion Stengthening;Both;10 reps;Knee bent;Knee straight   red tband   Hip Flexion Limitations PTA to support LEft knee as it wants to buckle    Hip Abduction Stengthening;Both;10 reps;Knee straight   red tband   Abduction Limitations PTA to support L knee    Hip Extension Stengthening;Right;10 reps;Knee straight   red tband   Extension Limitations PTA to support L knee    Step Down Both;10 reps;Hand Hold: 2;Step Height: 4"      Knee/Hip Exercises: Seated   Long Arc Quad Strengthening;Both;10 reps;2 sets   red tband                      PT Short Term Goals - 01/06/21 1228       PT SHORT TERM GOAL #1   Title Patient to be independent with initial HEP.    Status Achieved               PT Long Term Goals - 02/03/21 1153       PT LONG TERM GOAL #1   Title Patient to be independent with advanced HEP.    Status Achieved      PT LONG TERM GOAL #2   Title Patient to demonstrate B LE strength >/=4+/5.    Baseline grossly 4/5 .pt also fatigues quickly    Status Partially Met      PT LONG TERM GOAL #3   Title Patient to score <14 sec on TUG testing in order to decrease risk of falls.    Baseline 15.25 sec with RW    Status Partially Met      PT LONG TERM GOAL #4   Title Patient to score <13 sec on 5xSTS in order to decrease risk of falls.    Baseline from mat without UE  19.41 sec with a couple LOB    Status Partially Met      PT LONG TERM GOAL #5   Title Patient to report tolerance for 15 min of walking with LRAD without fatigue limiting.    Status Partially Met                   Plan - 02/05/21 1224     Clinical Impression Statement progressed strength and func with addition of staidng ex for balance and PTA had to block LLE with SLS as it buckles.    PT Next Visit Plan progress strength and func             Patient will benefit from skilled therapeutic intervention in order to improve the following deficits and impairments:  Decreased balance, Difficulty walking, Decreased activity tolerance, Decreased strength, Pain  Visit Diagnosis: Muscle weakness (generalized)  Difficulty in walking, not elsewhere classified  Unsteadiness on feet     Problem List Patient Active Problem List   Diagnosis Date Noted   Symptomatic bradycardia 01/16/2019   Normocytic anemia 01/16/2019   Diabetic peripheral neuropathy ([place]) 01/12/2019   Neurologic abnormality 12/25/2018   Debility 09/21/2018   Hypotension 09/18/2018   Bradycardia 09/18/2018   AKI (acute kidney injury) ([place]) 09/18/2018   Pressure injury of skin 09/18/2018   Ischemic cerebrovascular accident (CVA) of frontal lobe ([place]) 09/13/2018   Fall 09/09/2018   History of CVA (cerebrovascular accident) 09/09/2018   Type II diabetes mellitus with renal manifestations ([place]) 09/09/2018   CKD (chronic kidney disease), stage III 09/09/2018   Sepsis ([place]) 09/09/2018   Depression with anxiety 09/09/2018  Slurred speech 09/09/2018   Ankle fracture, left 05/16/2018   Amiodarone pulmonary toxicity    Physical deconditioning    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[place])    Acute on chronic congestive heart failure (HCC)    Chronic combined systolic (congestive) and diastolic (congestive) heart failure ([place]) 09/73/5329   Chronic systolic CHF (congestive heart failure) ([place]) 01/16/2018   Atrial  fibrillation ([place]) 10/21/2017   CAD (coronary artery disease) of bypass graft 10/20/2017   Coronary artery disease of bypass graft of native heart with stable angina pectoris (HCC)    Chronic low back pain 08/24/2016   Pain of left thumb 08/05/2016   Nocturnal hypoxia 06/02/2016   Hoarseness 04/05/2016   Laryngopharyngeal reflux (LPR) 04/05/2016   Pharyngoesophageal dysphagia 04/05/2016   Angina decubitus ([place]) 05/22/2015   Chronic insomnia 05/06/2015   Common migraine 05/14/2014   Abnormality of gait 05/14/2014   Memory change 05/14/2014   Aneurysm, cerebral, nonruptured 05/14/2014   Cramp of limb-Left neck 05/10/2014   Hematemesis with nausea    Vomiting blood    Dizziness and giddiness 09/21/2013   Atypical chest pain 07/18/2013   Unstable angina ([place]) 04/09/2013   Carotid artery stenosis, symptomatic 03/20/2013   Cerebrovascular disease 07/10/2012   Cerebral artery occlusion with cerebral infarction ([place]) 07/10/2012   Mitral regurgitation 04/15/2012   TIA (transient ischemic attack) 04/15/2012   Occlusion and stenosis of carotid artery without mention of cerebral infarction 08/18/2011   Bariatric surgery status 06/17/2011   Speech abnormality 03/22/2011   Dyspnea 02/24/2011   PAPILLARY MUSCLE DYSFUNCTION, NON-RHEUMATIC 10/09/2008   UNSPECIFIED VITAMIN D DEFICIENCY 10/24/2007   MYOCARDIAL INFARCTION, HX OF 10/24/2007   PERSISTENT VOMITING 10/24/2007   OSTEOARTHRITIS 10/24/2007   MIGRAINES, HX OF 10/24/2007   Diabetes mellitus type 2, controlled, with complications ([place]) 92/42/6834   Hyperlipemia 01/16/2007   Obesity-post failed open gastroplasty 1984  01/16/2007   OBSTRUCTIVE SLEEP APNEA 01/16/2007   Essential hypertension 01/16/2007   Coronary atherosclerosis 01/16/2007   Asthma 01/16/2007   GERD 01/16/2007   VENTRAL HERNIA 01/16/2007    Katisha Shimizu,ANGIE, PTA 02/05/2021, 12:26 PM  [place]. [place], [place], [address] Phone: [phone]   Fax:  [phone]  Name: ALISE CALAIS [name]: [id number] Date of Birth: [birth year]

## 2021-02-10 ENCOUNTER — Ambulatory Visit: Payer: Medicare Other | Admitting: Physical Therapy

## 2021-02-10 ENCOUNTER — Other Ambulatory Visit: Payer: Self-pay

## 2021-02-10 ENCOUNTER — Encounter: Payer: Self-pay | Admitting: Physical Therapy

## 2021-02-10 DIAGNOSIS — R2681 Unsteadiness on feet: Secondary | ICD-10-CM

## 2021-02-10 DIAGNOSIS — M6281 Muscle weakness (generalized): Secondary | ICD-10-CM

## 2021-02-10 DIAGNOSIS — R262 Difficulty in walking, not elsewhere classified: Secondary | ICD-10-CM

## 2021-02-10 NOTE — Therapy (Signed)
[place]. [place], [place], [address] Phone: [phone]   Fax:  [phone]  Physical Therapy Treatment  Patient Details  Name: [name] [name] [name]: [id number] Date of Birth: Aug 07, 1945 Referring Provider (PT): Dr Loralie Champagne   Encounter Date: 02/10/2021   PT End of Session - 02/10/21 1226     Visit Number 25    Date for PT Re-Evaluation 03/06/21    PT Start Time 1145    PT Stop Time 1228    PT Time Calculation (min) 43 min    Activity Tolerance Patient tolerated treatment well    Behavior During Therapy [facility] for tasks assessed/performed             Past Medical History:  Diagnosis Date   Anemia    hx   Anxiety    Asthma    Basal cell carcinoma 05/2014   "left shoulder"   Bundle branch block, left    chronic/notes 07/18/2013   CHF (congestive heart failure) ([place])    Chronic insomnia 05/06/2015   Chronic kidney disease    frequency, sees dr Jamal Maes every 4 to 6 months (01/16/2018)   Chronic lower back pain    Claustrophobia    Common migraine 05/14/2014   Coronary artery disease    MI in 2001, 2002, 2006, 2011, 2014   Depression    Diabetic peripheral neuropathy ([place]) 01/12/2019   GERD (gastroesophageal reflux disease)    H/O hiatal hernia    Headache    "at least 2/month" (01/16/2018)   Heart murmur    Hyperlipidemia    Hypertension    Memory change 05/14/2014   Migraine    "5-6/year"  (01/16/2018)   Obesity 01-2010   Obstructive sleep apnea    "can't wear machine; I have claustrophobia" (01/16/2018), states she had a 2nd sleep study and does not have sleep apnea, her O2 decreases and now is on 2 L of O2 at night.   On home oxygen therapy    "2L at night and prn during daytime" (01/16/2018)   Osteoarthritis    "knees and hands" (01/16/2018)   Peripheral vascular disease ([place])    ? numbness, tingling arms and legs   PONV (postoperative nausea and vomiting)    Stroke ([place])     2014, 2015, 2016   Type II diabetes mellitus ([place])    Ventral hernia    hx of    Past Surgical History:  Procedure Laterality Date   ANKLE FRACTURE SURGERY Left 1970's   APPENDECTOMY  1970's   w/hysterectomy   BASAL CELL CARCINOMA EXCISION Left 05/2014   "shoulder" (01/16/2018)   CARDIAC CATHETERIZATION  10/10/2012   Dr [name].   CARDIAC CATHETERIZATION N/A 05/29/2015   Procedure: Right/Left Heart Cath and Coronary/Graft Angiography;  Surgeon: Larey Dresser, MD;  Location: [place] [facility] LAB;  Service: Cardiovascular;  Laterality: N/A;   CAROTID ENDARTERECTOMY Left 03/2013   CAROTID STENT INSERTION Left 03/20/2013   Procedure: CAROTID STENT INSERTION;  Surgeon: [name], MD;  [facility] CATH LAB;  Service: Cardiovascular;  Laterality: Left;  internal carotid   CEREBRAL ANGIOGRAM N/A 04/05/2011   Procedure: CEREBRAL ANGIOGRAM;  Surgeon: Angelia Mould, MD;  Location[facility] CATH LAB;  Service: Cardiovascular;  Laterality: N/A;   CHOLECYSTECTOMY OPEN  2004   CORONARY ANGIOPLASTY WITH STENT PLACEMENT  01,02,05,06,07,08,11; 04/24/2013   "I've probably got ~ 10 stents by now" (04/24/2013)   CORONARY ANGIOPLASTY WITH STENT PLACEMENT  06/13/2013   "got 4 stents today" (06/13/2013)   CORONARY ARTERY BYPASS GRAFT  1220/11   "CABG X5"   CORONARY STENT INTERVENTION N/A 10/20/2017   Procedure: CORONARY STENT INTERVENTION;  Surgeon: Troy Sine, MD;  Location: [facility] CV LAB;  Service: Cardiovascular;  Laterality: N/A;   ESOPHAGOGASTRODUODENOSCOPY  08/03/2011   Procedure: ESOPHAGOGASTRODUODENOSCOPY (EGD);  Surgeon: Shann Medal, MD;  [facility];  Service: General;  Laterality: N/A;   ESOPHAGOGASTRODUODENOSCOPY (EGD) WITH PROPOFOL N/A 03/11/2014   Procedure: ESOPHAGOGASTRODUODENOSCOPY (EGD) WITH PROPOFOL;  Surgeon: Lafayette Dragon, MD;  [facility]OS[facility];  Service: Endoscopy;  Laterality: N/A;   FRACTURE SURGERY     gall stone removal  05/2003   GASTRIC RESTRICTION  SURGERY  1984   "stapeling"   HERNIA REPAIR  2004   "in my stomach; had OR on it twice", wire mesh on 1 hernia   LEFT HEART CATH AND CORS/GRAFTS ANGIOGRAPHY N/A 07/09/2016   Procedure: LEFT HEART CATH AND CORS/GRAFTS ANGIOGRAPHY;  Surgeon: Larey Dresser, MD;  Location: [facility] CV LAB;  Service: Cardiovascular;  Laterality: N/A;   ORIF ANKLE FRACTURE Left 05/16/2018   ORIF ANKLE FRACTURE Left 05/16/2018   Procedure: OPEN REDUCTION INTERNAL FIXATION (ORIF) Left ankle with possible syndesmosis fixation;  Surgeon: Nicholes Stairs, MD;  Location: [facility];  Service: Orthopedics;  Laterality: Left;  167mn   OVARY SURGERY  1970's   "tumor removed"   PERCUTANEOUS CORONARY STENT INTERVENTION (PCI-S) N/A 06/13/2013   Procedure: PERCUTANEOUS CORONARY STENT INTERVENTION (PCI-S);  Surgeon: JJettie Booze MD;  [facility];  Service: Cardiovascular;  Laterality: N/A;   PERCUTANEOUS STENT INTERVENTION N/A 04/24/2013   Procedure: PERCUTANEOUS STENT INTERVENTION;  Surgeon: JJettie Booze MD;  [facility];  Service: Cardiovascular;  Laterality: N/A;   RIGHT/LEFT HEART CATH AND CORONARY/GRAFT ANGIOGRAPHY N/A 10/20/2017   Procedure: RIGHT/LEFT HEART CATH AND CORONARY/GRAFT ANGIOGRAPHY;  Surgeon: MLarey Dresser MD;  [facility];  Service: Cardiovascular;  Laterality: N/A;   ROOT CANAL  10/2000   TEE WITHOUT CARDIOVERSION N/A 01/18/2019   Procedure: TRANSESOPHAGEAL ECHOCARDIOGRAM (TEE);  Surgeon: MLarey Dresser MD;  [facility];  Service: Cardiovascular;  Laterality: N/A;   TIBIA FRACTURE SURGERY Right 1970's   rods and pins   TOOTH EXTRACTION     "1 on the upper; wisdom tooth on the lower" (01/16/2018)   TOTAL ABDOMINAL HYSTERECTOMY  1970's   w/ appendectomy    There were no vitals filed for this visit.   Subjective Assessment - 02/10/21 1147     Subjective "half today" "just don't have much energy"    Currently in Pain? Yes    Pain  Score 5     Pain Location Back    Pain Orientation Lower                               [facility] Adult PT Treatment/Exercise - 02/10/21 0001       Lumbar Exercises: Aerobic   Nustep L 5 7 min      Lumbar Exercises: Standing   Row Strengthening;Both;Theraband;20 reps    Theraband Level (Row) Level 3 (Green)    Shoulder Extension Strengthening;Both;Theraband;20 reps    Theraband Level (Shoulder Extension) Level 3 (Green)      Lumbar Exercises: Seated   Sit to Stand 10 reps;5 reps   wt ball press   Other Seated Lumbar Exercises Horiz  Abd 2x10      Knee/Hip Exercises: Standing   Hip Abduction Stengthening;Both;2 sets;10 reps;Knee straight   Rolator   Abduction Limitations 2    Hip Extension Both;2 sets;10 reps;Knee straight   rolator   Extension Limitations 2      Knee/Hip Exercises: Seated   Long Arc Quad Strengthening;Both;2 sets;15 reps    Long Arc Quad Weight 2 lbs.    Marching Strengthening;Both;2 sets;10 reps    Marching Weights 2 lbs.                       PT Short Term Goals - 01/06/21 1228       PT SHORT TERM GOAL #1   Title Patient to be independent with initial HEP.    Status Achieved               PT Long Term Goals - 02/03/21 1153       PT LONG TERM GOAL #1   Title Patient to be independent with advanced HEP.    Status Achieved      PT LONG TERM GOAL #2   Title Patient to demonstrate B LE strength >/=4+/5.    Baseline grossly 4/5 .pt also fatigues quickly    Status Partially Met      PT LONG TERM GOAL #3   Title Patient to score <14 sec on TUG testing in order to decrease risk of falls.    Baseline 15.25 sec with RW    Status Partially Met      PT LONG TERM GOAL #4   Title Patient to score <13 sec on 5xSTS in order to decrease risk of falls.    Baseline from mat without UE 19.41 sec with a couple LOB    Status Partially Met      PT LONG TERM GOAL #5   Title Patient to report tolerance for 15 min of walking  with LRAD without fatigue limiting.    Status Partially Met                   Plan - 02/10/21 1227     Clinical Impression Statement Pt get very short of breath with activity requiring frequent rest. Pt did well progressing with strength and function. Postural cues needed with standing hip interventions to prevent leaning    Personal Factors and Comorbidities Age;Comorbidity 2    Comorbidities CHF, Pneumonia    Examination-Activity Limitations Locomotion Level;Stairs    Examination-Participation Restrictions Occupation;Community Activity    Stability/Clinical Decision Making Evolving/Moderate complexity    Rehab Potential Good    PT Duration 4 weeks    PT Treatment/Interventions ADLs/Self Care Home Management;Cryotherapy;Electrical Stimulation;Iontophoresis 58m/ml Dexamethasone;Moist Heat;Gait training;Stair training;Functional mobility training;Therapeutic activities;Therapeutic exercise;Balance training;Neuromuscular re-education;Patient/family education;Manual techniques;Passive range of motion;Dry needling;Taping;Vasopneumatic Device;Joint Manipulations;Spinal Manipulations    PT Next Visit Plan progress strength and func             Patient will benefit from skilled therapeutic intervention in order to improve the following deficits and impairments:  Decreased balance, Difficulty walking, Decreased activity tolerance, Decreased strength, Pain  Visit Diagnosis: Muscle weakness (generalized)  Unsteadiness on feet  Difficulty in walking, not elsewhere classified     Problem List Patient Active Problem List   Diagnosis Date Noted   Symptomatic bradycardia 01/16/2019   Normocytic anemia 01/16/2019   Diabetic peripheral neuropathy ([place] 01/12/2019   Neurologic abnormality 12/25/2018   Debility 09/21/2018   Hypotension 09/18/2018   Bradycardia 09/18/2018   AKI (  acute kidney injury) ([place]) 09/18/2018   Pressure injury of skin 09/18/2018   Ischemic cerebrovascular  accident (CVA) of frontal lobe ([place]) 09/13/2018   Fall 09/09/2018   History of CVA (cerebrovascular accident) 09/09/2018   Type II diabetes mellitus with renal manifestations ([place]) 09/09/2018   CKD (chronic kidney disease), stage III 09/09/2018   Sepsis ([place]) 09/09/2018   Depression with anxiety 09/09/2018   Slurred speech 09/09/2018   Ankle fracture, left 05/16/2018   Amiodarone pulmonary toxicity    Physical deconditioning    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[place])    Acute on chronic congestive heart failure (HCC)    Chronic combined systolic (congestive) and diastolic (congestive) heart failure ([place]) 27/61/4709   Chronic systolic CHF (congestive heart failure) ([place]) 01/16/2018   Atrial fibrillation ([place]) 10/21/2017   CAD (coronary artery disease) of bypass graft 10/20/2017   Coronary artery disease of bypass graft of native heart with stable angina pectoris (HCC)    Chronic low back pain 08/24/2016   Pain of left thumb 08/05/2016   Nocturnal hypoxia 06/02/2016   Hoarseness 04/05/2016   Laryngopharyngeal reflux (LPR) 04/05/2016   Pharyngoesophageal dysphagia 04/05/2016   Angina decubitus ([place]) 05/22/2015   Chronic insomnia 05/06/2015   Common migraine 05/14/2014   Abnormality of gait 05/14/2014   Memory change 05/14/2014   Aneurysm, cerebral, nonruptured 05/14/2014   Cramp of limb-Left neck 05/10/2014   Hematemesis with nausea    Vomiting blood    Dizziness and giddiness 09/21/2013   Atypical chest pain 07/18/2013   Unstable angina (HCC) 04/09/2013   Carotid artery stenosis, symptomatic 03/20/2013   Cerebrovascular disease 07/10/2012   Cerebral artery occlusion with cerebral infarction ([place]) 07/10/2012   Mitral regurgitation 04/15/2012   TIA (transient ischemic attack) 04/15/2012   Occlusion and stenosis of carotid artery without mention of cerebral infarction 08/18/2011   Bariatric surgery status 06/17/2011   Speech abnormality 03/22/2011   Dyspnea 02/24/2011   PAPILLARY  MUSCLE DYSFUNCTION, NON-RHEUMATIC 10/09/2008   UNSPECIFIED VITAMIN D DEFICIENCY 10/24/2007   MYOCARDIAL INFARCTION, HX OF 10/24/2007   PERSISTENT VOMITING 10/24/2007   OSTEOARTHRITIS 10/24/2007   MIGRAINES, HX OF 10/24/2007   Diabetes mellitus type 2, controlled, with complications ([place]) 29/57/4734   Hyperlipemia 01/16/2007   Obesity-post failed open gastroplasty 1984  01/16/2007   OBSTRUCTIVE SLEEP APNEA 01/16/2007   Essential hypertension 01/16/2007   Coronary atherosclerosis 01/16/2007   Asthma 01/16/2007   GERD 01/16/2007   VENTRAL HERNIA 01/16/2007    Scot Jun, PTA 02/10/2021, 12:29 PM  [place]. [place], [place], [address] Phone: [phone]   Fax:  [phone]  Name: [name] [name] [name]: [id number] Date of Birth: 08-19-1945

## 2021-02-12 ENCOUNTER — Other Ambulatory Visit: Payer: Self-pay

## 2021-02-12 ENCOUNTER — Ambulatory Visit: Payer: Medicare Other | Admitting: Physical Therapy

## 2021-02-12 DIAGNOSIS — M6281 Muscle weakness (generalized): Secondary | ICD-10-CM

## 2021-02-12 DIAGNOSIS — R5381 Other malaise: Secondary | ICD-10-CM

## 2021-02-12 DIAGNOSIS — R262 Difficulty in walking, not elsewhere classified: Secondary | ICD-10-CM

## 2021-02-12 DIAGNOSIS — R2681 Unsteadiness on feet: Secondary | ICD-10-CM

## 2021-02-12 NOTE — Therapy (Signed)
[facility] [place], [place], [address] Phone: [phone]   Fax:  [phone]  Physical Therapy Treatment  Patient Details  Name: [name] [name] [name]: [id number] Date of Birth: 09/06/1945 Referring Provider (PT): Dr Loralie Champagne   Encounter Date: 02/12/2021   PT End of Session - 02/12/21 1200     Visit Number 26    Date for PT Re-Evaluation 03/06/21    PT Start Time 1133    PT Stop Time 1216    PT Time Calculation (min) 43 min             Past Medical History:  Diagnosis Date   Anemia    hx   Anxiety    Asthma    Basal cell carcinoma 05/2014   "left shoulder"   Bundle branch block, left    chronic/notes 07/18/2013   CHF (congestive heart failure) ([place])    Chronic insomnia 05/06/2015   Chronic kidney disease    frequency, sees dr Jamal Maes every 4 to 6 months (01/16/2018)   Chronic lower back pain    Claustrophobia    Common migraine 05/14/2014   Coronary artery disease    MI in 2001, 2002, 2006, 2011, 2014   Depression    Diabetic peripheral neuropathy ([place]) 01/12/2019   GERD (gastroesophageal reflux disease)    H/O hiatal hernia    Headache    "at least 2/month" (01/16/2018)   Heart murmur    Hyperlipidemia    Hypertension    Memory change 05/14/2014   Migraine    "5-6/year"  (01/16/2018)   Obesity 01-2010   Obstructive sleep apnea    "can't wear machine; I have claustrophobia" (01/16/2018), states she had a 2nd sleep study and does not have sleep apnea, her O2 decreases and now is on 2 L of O2 at night.   On home oxygen therapy    "2L at night and prn during daytime" (01/16/2018)   Osteoarthritis    "knees and hands" (01/16/2018)   Peripheral vascular disease ([place])    ? numbness, tingling arms and legs   PONV (postoperative nausea and vomiting)    Stroke ([place])    2014, 2015, 2016   Type II diabetes mellitus ([place])    Ventral hernia    hx of    Past Surgical History:   Procedure Laterality Date   ANKLE FRACTURE SURGERY Left 1970's   APPENDECTOMY  1970's   w/hysterectomy   BASAL CELL CARCINOMA EXCISION Left 05/2014   "shoulder" (01/16/2018)   CARDIAC CATHETERIZATION  10/10/2012   Dr [name].   CARDIAC CATHETERIZATION N/A 05/29/2015   Procedure: Right/Left Heart Cath and Coronary/Graft Angiography;  Surgeon: Larey Dresser, MD;  Location: [facility];  Service: Cardiovascular;  Laterality: N/A;   CAROTID ENDARTERECTOMY Left 03/2013   CAROTID STENT INSERTION Left 03/20/2013   Procedure: CAROTID STENT INSERTION;  Surgeon: [name], MD;  Location: [facility] CATH LAB;  Service: Cardiovascular;  Laterality: Left;  internal carotid   CEREBRAL ANGIOGRAM N/A 04/05/2011   Procedure: CEREBRAL ANGIOGRAM;  Surgeon: Angelia Mould, MD;  Location: [facility] CATH LAB;  Service: Cardiovascular;  Laterality: N/A;   CHOLECYSTECTOMY OPEN  2004   CORONARY ANGIOPLASTY WITH STENT PLACEMENT  01,02,05,06,07,08,11; 04/24/2013   "I've probably got ~ 10 stents by now" (04/24/2013)   CORONARY ANGIOPLASTY WITH STENT PLACEMENT  06/13/2013   "got 4 stents today" (06/13/2013)   CORONARY ARTERY BYPASS GRAFT  1220/11   "CABG  X5"   CORONARY STENT INTERVENTION N/A 10/20/2017   Procedure: CORONARY STENT INTERVENTION;  Surgeon: Troy Sine, MD;  Location: [facility];  Service: Cardiovascular;  Laterality: N/A;   ESOPHAGOGASTRODUODENOSCOPY  08/03/2011   Procedure: ESOPHAGOGASTRODUODENOSCOPY (EGD);  Surgeon: Shann Medal, MD;  Location: [facility];  Service: General;  Laterality: N/A;   ESOPHAGOGASTRODUODENOSCOPY (EGD) WITH PROPOFOL N/A 03/11/2014   Procedure: ESOPHAGOGASTRODUODENOSCOPY (EGD) WITH PROPOFOL;  Surgeon: Lafayette Dragon, MD;  Location: [facility]OSCOPY;  Service: Endoscopy;  Laterality: N/A;   FRACTURE SURGERY     gall stone removal  05/2003   GASTRIC RESTRICTION SURGERY  1984   "stapeling"   HERNIA REPAIR  2004   "in my stomach; had OR on it twice", wire mesh on 1 hernia    LEFT HEART CATH AND CORS/GRAFTS ANGIOGRAPHY N/A 07/09/2016   Procedure: LEFT HEART CATH AND CORS/GRAFTS ANGIOGRAPHY;  Surgeon: Larey Dresser, MD;  Location: [facility];  Service: Cardiovascular;  Laterality: N/A;   ORIF ANKLE FRACTURE Left 05/16/2018   ORIF ANKLE FRACTURE Left 05/16/2018   Procedure: OPEN REDUCTION INTERNAL FIXATION (ORIF) Left ankle with possible syndesmosis fixation;  Surgeon: Nicholes Stairs, MD;  Location: [facility];  Service: Orthopedics;  Laterality: Left;  163mn   OVARY SURGERY  1970's   "tumor removed"   PERCUTANEOUS CORONARY STENT INTERVENTION (PCI-S) N/A 06/13/2013   Procedure: PERCUTANEOUS CORONARY STENT INTERVENTION (PCI-S);  Surgeon: JJettie Booze MD;  [facility];  Service: Cardiovascular;  Laterality: N/A;   PERCUTANEOUS STENT INTERVENTION N/A 04/24/2013   Procedure: PERCUTANEOUS STENT INTERVENTION;  Surgeon: JJettie Booze MD;  [facility];  Service: Cardiovascular;  Laterality: N/A;   RIGHT/LEFT HEART CATH AND CORONARY/GRAFT ANGIOGRAPHY N/A 10/20/2017   Procedure: RIGHT/LEFT HEART CATH AND CORONARY/GRAFT ANGIOGRAPHY;  Surgeon: MLarey Dresser MD;  [facility];  Service: Cardiovascular;  Laterality: N/A;   ROOT CANAL  10/2000   TEE WITHOUT CARDIOVERSION N/A 01/18/2019   Procedure: TRANSESOPHAGEAL ECHOCARDIOGRAM (TEE);  Surgeon: MLarey Dresser MD;  [facility];  Service: Cardiovascular;  Laterality: N/A;   TIBIA FRACTURE SURGERY Right 1970's   rods and pins   TOOTH EXTRACTION     "1 on the upper; wisdom tooth on the lower" (01/16/2018)   TOTAL ABDOMINAL HYSTERECTOMY  1970's   w/ appendectomy    There were no vitals filed for this visit.   Subjective Assessment - 02/12/21 1135     Subjective alittle tired today. heart MRI tuesday    Currently in Pain? Yes    Pain Score 5     Pain Location Back    Pain Orientation Lower                               [facility]  Adult PT Treatment/Exercise - 02/12/21 0001       Lumbar Exercises: Aerobic   Nustep L 5 7 min      Lumbar Exercises: Machines for Strengthening   Cybex Lumbar Extension black tband 2 sets 12    Other Lumbar Machine Exercise 25# seated row and lat 2 sets 12      Lumbar Exercises: Seated   Sit to Stand 10 reps   without UE wt ball press     Knee/Hip Exercises: Machines for Strengthening   Cybex Knee Extension 10# 2 sets 12    Cybex Knee Flexion 25# 2 sets 12  Knee/Hip Exercises: Standing   Hip Flexion Stengthening;Both;2 sets;10 reps;Knee bent;Knee straight   PTA assist to stab LEft knee with SLS as it buckles   Hip Flexion Limitations 3#    Hip Abduction Stengthening;Both;2 sets;10 reps;Knee straight   PTA to stab Left knee   Abduction Limitations 3#    Hip Extension Both;2 sets;10 reps;Knee straight   PTA to stab left knee   Extension Limitations 3#                       PT Short Term Goals - 01/06/21 1228       PT SHORT TERM GOAL #1   Title Patient to be independent with initial HEP.    Status Achieved               PT Long Term Goals - 02/12/21 1147       PT LONG TERM GOAL #1   Title Patient to be independent with advanced HEP.    Status Achieved      PT LONG TERM GOAL #2   Title Patient to demonstrate B LE strength >/=4+/5.    Baseline grossly 4/5 .pt also fatigues quickly    Status Partially Met      PT LONG TERM GOAL #3   Title Patient to score <14 sec on TUG testing in order to decrease risk of falls.    Baseline 12.44 sec with RW with SOB    Status Achieved      PT LONG TERM GOAL #4   Title Patient to score <13 sec on 5xSTS in order to decrease risk of falls.    Baseline from mat without UE 22 sec with a 1 LOB    Status Partially Met      PT LONG TERM GOAL #5   Title Patient to report tolerance for 15 min of walking with LRAD without fatigue limiting.    Baseline 5 min per pt report, back pain and SOB    Status Partially Met                    Plan - 02/12/21 1200     Clinical Impression Statement TUG goal met. STS goal still difficult and limited gait distance and ability for prolonged standing. denies fall but left knee instability noted. progressing with goals and sees benefits of skilled PT. progressing with goals    PT Treatment/Interventions ADLs/Self Care Home Management;Cryotherapy;Electrical Stimulation;Iontophoresis 1m/ml Dexamethasone;Moist Heat;Gait training;Stair training;Functional mobility training;Therapeutic activities;Therapeutic exercise;Balance training;Neuromuscular re-education;Patient/family education;Manual techniques;Passive range of motion;Dry needling;Taping;Vasopneumatic Device;Joint Manipulations;Spinal Manipulations    PT Next Visit Plan progress strength and func             Patient will benefit from skilled therapeutic intervention in order to improve the following deficits and impairments:  Decreased balance, Difficulty walking, Decreased activity tolerance, Decreased strength, Pain  Visit Diagnosis: Muscle weakness (generalized)  Unsteadiness on feet  Difficulty in walking, not elsewhere classified  Debility     Problem List Patient Active Problem List   Diagnosis Date Noted   Symptomatic bradycardia 01/16/2019   Normocytic anemia 01/16/2019   Diabetic peripheral neuropathy ([place] 01/12/2019   Neurologic abnormality 12/25/2018   Debility 09/21/2018   Hypotension 09/18/2018   Bradycardia 09/18/2018   AKI (acute kidney injury) ([place] 09/18/2018   Pressure injury of skin 09/18/2018   Ischemic cerebrovascular accident (CVA) of frontal lobe ([place] 09/13/2018   Fall 09/09/2018   History of CVA (cerebrovascular accident) 09/09/2018  Type II diabetes mellitus with renal manifestations ([place]) 09/09/2018   CKD (chronic kidney disease), stage III 09/09/2018   Sepsis ([place]) 09/09/2018   Depression with anxiety 09/09/2018   Slurred speech 09/09/2018   Ankle  fracture, left 05/16/2018   Amiodarone pulmonary toxicity    Physical deconditioning    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[place])    Acute on chronic congestive heart failure (HCC)    Chronic combined systolic (congestive) and diastolic (congestive) heart failure ([place]) 47/08/6149   Chronic systolic CHF (congestive heart failure) ([place]) 01/16/2018   Atrial fibrillation ([place]) 10/21/2017   CAD (coronary artery disease) of bypass graft 10/20/2017   Coronary artery disease of bypass graft of native heart with stable angina pectoris (HCC)    Chronic low back pain 08/24/2016   Pain of left thumb 08/05/2016   Nocturnal hypoxia 06/02/2016   Hoarseness 04/05/2016   Laryngopharyngeal reflux (LPR) 04/05/2016   Pharyngoesophageal dysphagia 04/05/2016   Angina decubitus ([place]) 05/22/2015   Chronic insomnia 05/06/2015   Common migraine 05/14/2014   Abnormality of gait 05/14/2014   Memory change 05/14/2014   Aneurysm, cerebral, nonruptured 05/14/2014   Cramp of limb-Left neck 05/10/2014   Hematemesis with nausea    Vomiting blood    Dizziness and giddiness 09/21/2013   Atypical chest pain 07/18/2013   Unstable angina ([place]) 04/09/2013   Carotid artery stenosis, symptomatic 03/20/2013   Cerebrovascular disease 07/10/2012   Cerebral artery occlusion with cerebral infarction ([place]) 07/10/2012   Mitral regurgitation 04/15/2012   TIA (transient ischemic attack) 04/15/2012   Occlusion and stenosis of carotid artery without mention of cerebral infarction 08/18/2011   Bariatric surgery status 06/17/2011   Speech abnormality 03/22/2011   Dyspnea 02/24/2011   PAPILLARY MUSCLE DYSFUNCTION, NON-RHEUMATIC 10/09/2008   UNSPECIFIED VITAMIN D DEFICIENCY 10/24/2007   MYOCARDIAL INFARCTION, HX OF 10/24/2007   PERSISTENT VOMITING 10/24/2007   OSTEOARTHRITIS 10/24/2007   MIGRAINES, HX OF 10/24/2007   Diabetes mellitus type 2, controlled, with complications ([place]) 83/43/7357   Hyperlipemia 01/16/2007   Obesity-post failed  open gastroplasty 1984  01/16/2007   OBSTRUCTIVE SLEEP APNEA 01/16/2007   Essential hypertension 01/16/2007   Coronary atherosclerosis 01/16/2007   Asthma 01/16/2007   GERD 01/16/2007   VENTRAL HERNIA 01/16/2007    Mat Stuard,ANGIE, PTA 02/12/2021, 12:08 PM  [facility]. [place], [place], [address] Phone: [phone]   Fax:  [phone]  Name: [name] [name] [name]: [id number] Date of Birth: 1945/10/08

## 2021-02-13 ENCOUNTER — Telehealth (HOSPITAL_COMMUNITY): Payer: Self-pay | Admitting: *Deleted

## 2021-02-13 NOTE — Telephone Encounter (Signed)
Attempted to call patient regarding upcoming cardiac MRI appointment. Left message on voicemail with name and callback number  Nicolis Boody RN Navigator Cardiac Imaging [facility] [phone] Office [phone] Cell  

## 2021-02-16 ENCOUNTER — Telehealth (HOSPITAL_COMMUNITY): Payer: Self-pay | Admitting: *Deleted

## 2021-02-16 NOTE — Telephone Encounter (Signed)
Reaching out to patient to offer assistance regarding upcoming cardiac imaging study; pt verbalizes understanding of appt date/time, parking situation and where to check in, and verified current allergies; name and call back number provided for further questions should they arise  Gordy Clement RN Navigator Cardiac [facility] [phone] office [phone] cell . Patient reports rods in legs but has had MRIs before in the past. She reports claustrophobia but has PRN Xanax to take. She states her daughter will be taking her to this appointment.

## 2021-02-17 ENCOUNTER — Encounter: Payer: Self-pay | Admitting: Physical Therapy

## 2021-02-17 ENCOUNTER — Ambulatory Visit: Payer: Medicare Other | Admitting: Physical Therapy

## 2021-02-17 ENCOUNTER — Ambulatory Visit (HOSPITAL_COMMUNITY)
Admission: RE | Admit: 2021-02-17 | Discharge: 2021-02-17 | Disposition: A | Discharge status: Home / Self Care | Payer: Medicare Other | Source: Ambulatory Visit | Attending: Cardiology | Admitting: Cardiology

## 2021-02-17 ENCOUNTER — Other Ambulatory Visit: Payer: Self-pay

## 2021-02-17 DIAGNOSIS — I5042 Chronic combined systolic (congestive) and diastolic (congestive) heart failure: Secondary | ICD-10-CM | POA: Insufficient documentation

## 2021-02-17 DIAGNOSIS — M6281 Muscle weakness (generalized): Secondary | ICD-10-CM

## 2021-02-17 DIAGNOSIS — R5381 Other malaise: Secondary | ICD-10-CM

## 2021-02-17 DIAGNOSIS — R2681 Unsteadiness on feet: Secondary | ICD-10-CM | POA: Insufficient documentation

## 2021-02-17 DIAGNOSIS — R262 Difficulty in walking, not elsewhere classified: Secondary | ICD-10-CM

## 2021-02-17 MED ORDER — GADOBUTROL 1 MMOL/ML IV SOLN
10.0000 mL | Freq: Once | INTRAVENOUS | Status: AC | PRN
  Administered 2021-02-17: 10 mL via INTRAVENOUS

## 2021-02-17 NOTE — Therapy (Signed)
[place]. [place], [place], [address] Phone: [phone]   Fax:  [phone]  Physical Therapy Treatment  Patient Details  Name: [name] [name] [name]: [id number] Date of Birth: 08-Mar-1945 Referring Provider (PT): Dr Loralie Champagne   Encounter Date: 02/17/2021   PT End of Session - 02/17/21 1226     Visit Number 27    Date for PT Re-Evaluation 03/06/21    PT Start Time 1145    PT Stop Time 1230    PT Time Calculation (min) 45 min             Past Medical History:  Diagnosis Date   Anemia    hx   Anxiety    Asthma    Basal cell carcinoma 05/2014   "left shoulder"   Bundle branch block, left    chronic/notes 07/18/2013   CHF (congestive heart failure) ([place])    Chronic insomnia 05/06/2015   Chronic kidney disease    frequency, sees dr Jamal Maes every 4 to 6 months (01/16/2018)   Chronic lower back pain    Claustrophobia    Common migraine 05/14/2014   Coronary artery disease    MI in 2001, 2002, 2006, 2011, 2014   Depression    Diabetic peripheral neuropathy ([place]) 01/12/2019   GERD (gastroesophageal reflux disease)    H/O hiatal hernia    Headache    "at least 2/month" (01/16/2018)   Heart murmur    Hyperlipidemia    Hypertension    Memory change 05/14/2014   Migraine    "5-6/year"  (01/16/2018)   Obesity 01-2010   Obstructive sleep apnea    "can't wear machine; I have claustrophobia" (01/16/2018), states she had a 2nd sleep study and does not have sleep apnea, her O2 decreases and now is on 2 L of O2 at night.   On home oxygen therapy    "2L at night and prn during daytime" (01/16/2018)   Osteoarthritis    "knees and hands" (01/16/2018)   Peripheral vascular disease ([place])    ? numbness, tingling arms and legs   PONV (postoperative nausea and vomiting)    Stroke ([place])    2014, 2015, 2016   Type II diabetes mellitus ([place])    Ventral hernia    hx of    Past Surgical History:   Procedure Laterality Date   ANKLE FRACTURE SURGERY Left 1970's   APPENDECTOMY  1970's   w/hysterectomy   BASAL CELL CARCINOMA EXCISION Left 05/2014   "shoulder" (01/16/2018)   CARDIAC CATHETERIZATION  10/10/2012   Dr [name].   CARDIAC CATHETERIZATION N/A 05/29/2015   Procedure: Right/Left Heart Cath and Coronary/Graft Angiography;  Surgeon: Larey Dresser, MD;  Location: [facility];  Service: Cardiovascular;  Laterality: N/A;   CAROTID ENDARTERECTOMY Left 03/2013   CAROTID STENT INSERTION Left 03/20/2013   Procedure: CAROTID STENT INSERTION;  Surgeon: [name], MD;  Location: [facility] CATH LAB;  Service: Cardiovascular;  Laterality: Left;  internal carotid   CEREBRAL ANGIOGRAM N/A 04/05/2011   Procedure: CEREBRAL ANGIOGRAM;  Surgeon: Angelia Mould, MD;  Location: [facility] CATH LAB;  Service: Cardiovascular;  Laterality: N/A;   CHOLECYSTECTOMY OPEN  2004   CORONARY ANGIOPLASTY WITH STENT PLACEMENT  01,02,05,06,07,08,11; 04/24/2013   "I've probably got ~ 10 stents by now" (04/24/2013)   CORONARY ANGIOPLASTY WITH STENT PLACEMENT  06/13/2013   "got 4 stents today" (06/13/2013)   CORONARY ARTERY BYPASS GRAFT  1220/11   "CABG  X5"   CORONARY STENT INTERVENTION N/A 10/20/2017   Procedure: CORONARY STENT INTERVENTION;  Surgeon: Troy Sine, MD;  Location: [place] [facility] LAB;  Service: Cardiovascular;  Laterality: N/A;   ESOPHAGOGASTRODUODENOSCOPY  08/03/2011   Procedure: ESOPHAGOGASTRODUODENOSCOPY (EGD);  Surgeon: Shann Medal, MD;  Location: [facility];  Service: General;  Laterality: N/A;   ESOPHAGOGASTRODUODENOSCOPY (EGD) WITH PROPOFOL N/A 03/11/2014   Procedure: ESOPHAGOGASTRODUODENOSCOPY (EGD) WITH PROPOFOL;  Surgeon: Lafayette Dragon, MD;  Location: [facility]OSCOPY;  Service: Endoscopy;  Laterality: N/A;   FRACTURE SURGERY     gall stone removal  05/2003   GASTRIC RESTRICTION SURGERY  1984   "stapeling"   HERNIA REPAIR  2004   "in my stomach; had OR on it twice", wire mesh on 1 hernia    LEFT HEART CATH AND CORS/GRAFTS ANGIOGRAPHY N/A 07/09/2016   Procedure: LEFT HEART CATH AND CORS/GRAFTS ANGIOGRAPHY;  Surgeon: Larey Dresser, MD;  Location: [place] CV LAB;  Service: Cardiovascular;  Laterality: N/A;   ORIF ANKLE FRACTURE Left 05/16/2018   ORIF ANKLE FRACTURE Left 05/16/2018   Procedure: OPEN REDUCTION INTERNAL FIXATION (ORIF) Left ankle with possible syndesmosis fixation;  Surgeon: Nicholes Stairs, MD;  Location: [facility];  Service: Orthopedics;  Laterality: Left;  12mn   OVARY SURGERY  1970's   "tumor removed"   PERCUTANEOUS CORONARY STENT INTERVENTION (PCI-S) N/A 06/13/2013   Procedure: PERCUTANEOUS CORONARY STENT INTERVENTION (PCI-S);  Surgeon: JJettie Booze MD;  [facility];  Service: Cardiovascular;  Laterality: N/A;   PERCUTANEOUS STENT INTERVENTION N/A 04/24/2013   Procedure: PERCUTANEOUS STENT INTERVENTION;  Surgeon: JJettie Booze MD;  [facility];  Service: Cardiovascular;  Laterality: N/A;   RIGHT/LEFT HEART CATH AND CORONARY/GRAFT ANGIOGRAPHY N/A 10/20/2017   Procedure: RIGHT/LEFT HEART CATH AND CORONARY/GRAFT ANGIOGRAPHY;  Surgeon: MLarey Dresser MD;  [facility];  Service: Cardiovascular;  Laterality: N/A;   ROOT CANAL  10/2000   TEE WITHOUT CARDIOVERSION N/A 01/18/2019   Procedure: TRANSESOPHAGEAL ECHOCARDIOGRAM (TEE);  Surgeon: MLarey Dresser MD;  [facility];  Service: Cardiovascular;  Laterality: N/A;   TIBIA FRACTURE SURGERY Right 1970's   rods and pins   TOOTH EXTRACTION     "1 on the upper; wisdom tooth on the lower" (01/16/2018)   TOTAL ABDOMINAL HYSTERECTOMY  1970's   w/ appendectomy    There were no vitals filed for this visit.   Subjective Assessment - 02/17/21 1146     Subjective caught herself three times today form falling. LLE giving out    Currently in Pain? Yes    Pain Score 6     Pain Location Back                               [facility] Adult PT  Treatment/Exercise - 02/17/21 0001       Lumbar Exercises: Aerobic   Nustep L 5 7 min      Lumbar Exercises: Machines for Strengthening   Cybex Lumbar Extension black tband 2 sets 15    Other Lumbar Machine Exercise 25# seated row and lat 2 sets 15      Knee/Hip Exercises: Standing   Other Standing Knee Exercises Standing march rolator 2x10      Knee/Hip Exercises: Seated   Long Arc Quad Strengthening;Both;2 sets;15 reps    Long Arc Quad Weight 3 lbs.    Other Seated Knee/Hip Exercises Fitter press LLE onw  black 2x15    Sit to Sand 2 sets;10 reps;without UE support   holding yellow ball                      PT Short Term Goals - 01/06/21 1228       PT SHORT TERM GOAL #1   Title Patient to be independent with initial HEP.    Status Achieved               PT Long Term Goals - 02/12/21 1147       PT LONG TERM GOAL #1   Title Patient to be independent with advanced HEP.    Status Achieved      PT LONG TERM GOAL #2   Title Patient to demonstrate B LE strength >/=4+/5.    Baseline grossly 4/5 .pt also fatigues quickly    Status Partially Met      PT LONG TERM GOAL #3   Title Patient to score <14 sec on TUG testing in order to decrease risk of falls.    Baseline 12.44 sec with RW with SOB    Status Achieved      PT LONG TERM GOAL #4   Title Patient to score <13 sec on 5xSTS in order to decrease risk of falls.    Baseline from mat without UE 22 sec with a 1 LOB    Status Partially Met      PT LONG TERM GOAL #5   Title Patient to report tolerance for 15 min of walking with LRAD without fatigue limiting.    Baseline 5 min per pt report, back pain and SOB    Status Partially Met                   Plan - 02/17/21 1227     Clinical Impression Statement Pt reports some giving away of her L knee today but able to catch herself before falling. He stated she has A MRI scheduled today of her heart. Increase fatigue note during today session with  activities. Assist given with standing marches to block L knee during SLS phase.    Personal Factors and Comorbidities Age;Comorbidity 2    Comorbidities CHF, Pneumonia    Examination-Activity Limitations Locomotion Level;Stairs    Examination-Participation Restrictions Occupation;Community Activity    Rehab Potential Good    PT Frequency 2x / week    PT Treatment/Interventions ADLs/Self Care Home Management;Cryotherapy;Electrical Stimulation;Iontophoresis 49m/ml Dexamethasone;Moist Heat;Gait training;Stair training;Functional mobility training;Therapeutic activities;Therapeutic exercise;Balance training;Neuromuscular re-education;Patient/family education;Manual techniques;Passive range of motion;Dry needling;Taping;Vasopneumatic Device;Joint Manipulations;Spinal Manipulations    PT Next Visit Plan progress strength and func             Patient will benefit from skilled therapeutic intervention in order to improve the following deficits and impairments:  Decreased balance, Difficulty walking, Decreased activity tolerance, Decreased strength, Pain  Visit Diagnosis: Unsteadiness on feet  Difficulty in walking, not elsewhere classified  Muscle weakness (generalized)  Debility     Problem List Patient Active Problem List   Diagnosis Date Noted   Symptomatic bradycardia 01/16/2019   Normocytic anemia 01/16/2019   Diabetic peripheral neuropathy ([place] 01/12/2019   Neurologic abnormality 12/25/2018   Debility 09/21/2018   Hypotension 09/18/2018   Bradycardia 09/18/2018   AKI (acute kidney injury) ([place] 09/18/2018   Pressure injury of skin 09/18/2018   Ischemic cerebrovascular accident (CVA) of frontal lobe ([place] 09/13/2018   Fall 09/09/2018   History of CVA (cerebrovascular accident) 09/09/2018  Type II diabetes mellitus with renal manifestations ([place]) 09/09/2018   CKD (chronic kidney disease), stage III 09/09/2018   Sepsis ([place]) 09/09/2018   Depression with anxiety  09/09/2018   Slurred speech 09/09/2018   Ankle fracture, left 05/16/2018   Amiodarone pulmonary toxicity    Physical deconditioning    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[place])    Acute on chronic congestive heart failure (HCC)    Chronic combined systolic (congestive) and diastolic (congestive) heart failure ([place]) 67/34/1937   Chronic systolic CHF (congestive heart failure) ([place]) 01/16/2018   Atrial fibrillation ([place]) 10/21/2017   CAD (coronary artery disease) of bypass graft 10/20/2017   Coronary artery disease of bypass graft of native heart with stable angina pectoris (HCC)    Chronic low back pain 08/24/2016   Pain of left thumb 08/05/2016   Nocturnal hypoxia 06/02/2016   Hoarseness 04/05/2016   Laryngopharyngeal reflux (LPR) 04/05/2016   Pharyngoesophageal dysphagia 04/05/2016   Angina decubitus ([place]) 05/22/2015   Chronic insomnia 05/06/2015   Common migraine 05/14/2014   Abnormality of gait 05/14/2014   Memory change 05/14/2014   Aneurysm, cerebral, nonruptured 05/14/2014   Cramp of limb-Left neck 05/10/2014   Hematemesis with nausea    Vomiting blood    Dizziness and giddiness 09/21/2013   Atypical chest pain 07/18/2013   Unstable angina ([place]) 04/09/2013   Carotid artery stenosis, symptomatic 03/20/2013   Cerebrovascular disease 07/10/2012   Cerebral artery occlusion with cerebral infarction ([place]) 07/10/2012   Mitral regurgitation 04/15/2012   TIA (transient ischemic attack) 04/15/2012   Occlusion and stenosis of carotid artery without mention of cerebral infarction 08/18/2011   Bariatric surgery status 06/17/2011   Speech abnormality 03/22/2011   Dyspnea 02/24/2011   PAPILLARY MUSCLE DYSFUNCTION, NON-RHEUMATIC 10/09/2008   UNSPECIFIED VITAMIN D DEFICIENCY 10/24/2007   MYOCARDIAL INFARCTION, HX OF 10/24/2007   PERSISTENT VOMITING 10/24/2007   OSTEOARTHRITIS 10/24/2007   MIGRAINES, HX OF 10/24/2007   Diabetes mellitus type 2, controlled, with complications ([place]) 90/24/0973    Hyperlipemia 01/16/2007   Obesity-post failed open gastroplasty 1984  01/16/2007   OBSTRUCTIVE SLEEP APNEA 01/16/2007   Essential hypertension 01/16/2007   Coronary atherosclerosis 01/16/2007   Asthma 01/16/2007   GERD 01/16/2007   VENTRAL HERNIA 01/16/2007    Scot Jun, PTA 02/17/2021, 12:31 PM  [place]. [place], [place], [address] Phone: [phone]   Fax:  [phone]  Name: CHRISTIANN HAGERTY [name]: [id number] Date of Birth: 05/04/1945

## 2021-02-19 ENCOUNTER — Ambulatory Visit: Payer: Medicare Other | Admitting: Physical Therapy

## 2021-02-19 ENCOUNTER — Other Ambulatory Visit: Payer: Self-pay

## 2021-02-19 DIAGNOSIS — M6281 Muscle weakness (generalized): Secondary | ICD-10-CM | POA: Diagnosis not present

## 2021-02-19 DIAGNOSIS — R5381 Other malaise: Secondary | ICD-10-CM

## 2021-02-19 DIAGNOSIS — R262 Difficulty in walking, not elsewhere classified: Secondary | ICD-10-CM

## 2021-02-19 DIAGNOSIS — R2681 Unsteadiness on feet: Secondary | ICD-10-CM

## 2021-02-19 NOTE — Therapy (Signed)
[place]. [place], [place], [address] Phone: [phone]   Fax:  [phone]  Physical Therapy Treatment  Patient Details  Name: [name] [name] [name]: [id number] Date of Birth: January 16, 1946 Referring Provider (PT): Dr Loralie Champagne   Encounter Date: 02/19/2021   PT End of Session - 02/19/21 1208     Visit Number 28    Date for PT Re-Evaluation 03/06/21    PT Start Time 1130    PT Stop Time 1215    PT Time Calculation (min) 45 min             Past Medical History:  Diagnosis Date   Anemia    hx   Anxiety    Asthma    Basal cell carcinoma 05/2014   "left shoulder"   Bundle branch block, left    chronic/notes 07/18/2013   CHF (congestive heart failure) ([place])    Chronic insomnia 05/06/2015   Chronic kidney disease    frequency, sees dr Jamal Maes every 4 to 6 months (01/16/2018)   Chronic lower back pain    Claustrophobia    Common migraine 05/14/2014   Coronary artery disease    MI in 2001, 2002, 2006, 2011, 2014   Depression    Diabetic peripheral neuropathy ([place]) 01/12/2019   GERD (gastroesophageal reflux disease)    H/O hiatal hernia    Headache    "at least 2/month" (01/16/2018)   Heart murmur    Hyperlipidemia    Hypertension    Memory change 05/14/2014   Migraine    "5-6/year"  (01/16/2018)   Obesity 01-2010   Obstructive sleep apnea    "can't wear machine; I have claustrophobia" (01/16/2018), states she had a 2nd sleep study and does not have sleep apnea, her O2 decreases and now is on 2 L of O2 at night.   On home oxygen therapy    "2L at night and prn during daytime" (01/16/2018)   Osteoarthritis    "knees and hands" (01/16/2018)   Peripheral vascular disease ([place])    ? numbness, tingling arms and legs   PONV (postoperative nausea and vomiting)    Stroke ([place])    2014, 2015, 2016   Type II diabetes mellitus ([place])    Ventral hernia    hx of    Past Surgical History:   Procedure Laterality Date   ANKLE FRACTURE SURGERY Left 1970's   APPENDECTOMY  1970's   w/hysterectomy   BASAL CELL CARCINOMA EXCISION Left 05/2014   "shoulder" (01/16/2018)   CARDIAC CATHETERIZATION  10/10/2012   Dr [name].   CARDIAC CATHETERIZATION N/A 05/29/2015   Procedure: Right/Left Heart Cath and Coronary/Graft Angiography;  Surgeon: Larey Dresser, MD;  Location: [facility] CV LAB;  Service: Cardiovascular;  Laterality: N/A;   CAROTID ENDARTERECTOMY Left 03/2013   CAROTID STENT INSERTION Left 03/20/2013   Procedure: CAROTID STENT INSERTION;  Surgeon: [name], MD;  [facility] CATH LAB;  Service: Cardiovascular;  Laterality: Left;  internal carotid   CEREBRAL ANGIOGRAM N/A 04/05/2011   Procedure: CEREBRAL ANGIOGRAM;  Surgeon: Angelia Mould, MD;  Location: [facility] CATH LAB;  Service: Cardiovascular;  Laterality: N/A;   CHOLECYSTECTOMY OPEN  2004   CORONARY ANGIOPLASTY WITH STENT PLACEMENT  01,02,05,06,07,08,11; 04/24/2013   "I've probably got ~ 10 stents by now" (04/24/2013)   CORONARY ANGIOPLASTY WITH STENT PLACEMENT  06/13/2013   "got 4 stents today" (06/13/2013)   CORONARY ARTERY BYPASS GRAFT  1220/11   "CABG  X5"   CORONARY STENT INTERVENTION N/A 10/20/2017   Procedure: CORONARY STENT INTERVENTION;  Surgeon: Troy Sine, MD;  Location: [place] [facility] LAB;  Service: Cardiovascular;  Laterality: N/A;   ESOPHAGOGASTRODUODENOSCOPY  08/03/2011   Procedure: ESOPHAGOGASTRODUODENOSCOPY (EGD);  Surgeon: Shann Medal, MD;  [facility];  Service: General;  Laterality: N/A;   ESOPHAGOGASTRODUODENOSCOPY (EGD) WITH PROPOFOL N/A 03/11/2014   Procedure: ESOPHAGOGASTRODUODENOSCOPY (EGD) WITH PROPOFOL;  Surgeon: Lafayette Dragon, MD;  Location: [facility]OS[facility];  Service: Endoscopy;  Laterality: N/A;   FRACTURE SURGERY     gall stone removal  05/2003   GASTRIC RESTRICTION SURGERY  1984   "stapeling"   HERNIA REPAIR  2004   "in my stomach; had OR on it twice", wire mesh on 1 hernia    LEFT HEART CATH AND CORS/GRAFTS ANGIOGRAPHY N/A 07/09/2016   Procedure: LEFT HEART CATH AND CORS/GRAFTS ANGIOGRAPHY;  Surgeon: Larey Dresser, MD;  Location: [place] CV LAB;  Service: Cardiovascular;  Laterality: N/A;   ORIF ANKLE FRACTURE Left 05/16/2018   ORIF ANKLE FRACTURE Left 05/16/2018   Procedure: OPEN REDUCTION INTERNAL FIXATION (ORIF) Left ankle with possible syndesmosis fixation;  Surgeon: Nicholes Stairs, MD;  Location: [facility];  Service: Orthopedics;  Laterality: Left;  185mn   OVARY SURGERY  1970's   "tumor removed"   PERCUTANEOUS CORONARY STENT INTERVENTION (PCI-S) N/A 06/13/2013   Procedure: PERCUTANEOUS CORONARY STENT INTERVENTION (PCI-S);  Surgeon: JJettie Booze MD;  [facility];  Service: Cardiovascular;  Laterality: N/A;   PERCUTANEOUS STENT INTERVENTION N/A 04/24/2013   Procedure: PERCUTANEOUS STENT INTERVENTION;  Surgeon: JJettie Booze MD;  [facility];  Service: Cardiovascular;  Laterality: N/A;   RIGHT/LEFT HEART CATH AND CORONARY/GRAFT ANGIOGRAPHY N/A 10/20/2017   Procedure: RIGHT/LEFT HEART CATH AND CORONARY/GRAFT ANGIOGRAPHY;  Surgeon: MLarey Dresser MD;  [facility];  Service: Cardiovascular;  Laterality: N/A;   ROOT CANAL  10/2000   TEE WITHOUT CARDIOVERSION N/A 01/18/2019   Procedure: TRANSESOPHAGEAL ECHOCARDIOGRAM (TEE);  Surgeon: MLarey Dresser MD;  [facility];  Service: Cardiovascular;  Laterality: N/A;   TIBIA FRACTURE SURGERY Right 1970's   rods and pins   TOOTH EXTRACTION     "1 on the upper; wisdom tooth on the lower" (01/16/2018)   TOTAL ABDOMINAL HYSTERECTOMY  1970's   w/ appendectomy    There were no vitals filed for this visit.   Subjective Assessment - 02/19/21 1148     Subjective alittle dopey today, MRI the other day was rough - hd to lay for 2.5 hours    Currently in Pain? Yes    Pain Score 6     Pain Location Back                                [facility] Adult PT Treatment/Exercise - 02/19/21 0001       Lumbar Exercises: Machines for Strengthening   Cybex Lumbar Extension black tband 2 sets 15      Lumbar Exercises: Standing   Row Strengthening;Both;Theraband;20 reps    Theraband Level (Row) Level 3 (Green)    Row Limitations wInformation systems managerTheraband;20 reps    Theraband Level (Shoulder Extension) Level 3 (Green)    Shoulder Extension Limitations favors more wt on RT LE      Lumbar Exercises: Seated   Sit to Stand 10 reps   with wt ball  Knee/Hip Exercises: Seated   Long Arc Quad Strengthening;Both;2 sets;10 reps    Long Arc Quad Weight 5 lbs.    Marching Strengthening;Both;2 sets;10 reps    Marching Weights 5 lbs.    Abduction/Adduction  Strengthening;Both;2 sets;10 reps    Abd/Adduction Weights 5 lbs.                       PT Short Term Goals - 01/06/21 1228       PT SHORT TERM GOAL #1   Title Patient to be independent with initial HEP.    Status Achieved               PT Long Term Goals - 02/19/21 1203       PT LONG TERM GOAL #1   Title Patient to be independent with advanced HEP.    Status Achieved      PT LONG TERM GOAL #2   Baseline grossly 4/5 .pt also fatigues quickly    Status Partially Met      PT LONG TERM GOAL #3   Title Patient to score <14 sec on TUG testing in order to decrease risk of falls.    Baseline 17 sec with RW with SOB at end of session, LLE buckling    Status Partially Met      PT LONG TERM GOAL #4   Title Patient to score <13 sec on 5xSTS in order to decrease risk of falls.    Baseline from mat without UE 21.6 sec CGA without LOB at end of session    Status Partially Met      PT LONG TERM GOAL #5   Title Patient to report tolerance for 15 min of walking with LRAD without fatigue limiting.    Baseline 5 min per pt report, back pain and SOB    Status Partially Met                   Plan - 02/19/21 1211      Clinical Impression Statement checked goals at end of session  and TUG was much longer as well as noted left LE drag and buckling. wobbly and whoozy with standing ex. progressing slowly with goals    PT Treatment/Interventions ADLs/Self Care Home Management;Cryotherapy;Electrical Stimulation;Iontophoresis 28m/ml Dexamethasone;Moist Heat;Gait training;Stair training;Functional mobility training;Therapeutic activities;Therapeutic exercise;Balance training;Neuromuscular re-education;Patient/family education;Manual techniques;Passive range of motion;Dry needling;Taping;Vasopneumatic Device;Joint Manipulations;Spinal Manipulations    PT Next Visit Plan progress strength and func             Patient will benefit from skilled therapeutic intervention in order to improve the following deficits and impairments:  Decreased balance, Difficulty walking, Decreased activity tolerance, Decreased strength, Pain  Visit Diagnosis: Unsteadiness on feet  Difficulty in walking, not elsewhere classified  Muscle weakness (generalized)  Debility     Problem List Patient Active Problem List   Diagnosis Date Noted   Symptomatic bradycardia 01/16/2019   Normocytic anemia 01/16/2019   Diabetic peripheral neuropathy ([place] 01/12/2019   Neurologic abnormality 12/25/2018   Debility 09/21/2018   Hypotension 09/18/2018   Bradycardia 09/18/2018   AKI (acute kidney injury) ([place] 09/18/2018   Pressure injury of skin 09/18/2018   Ischemic cerebrovascular accident (CVA) of frontal lobe ([place] 09/13/2018   Fall 09/09/2018   History of CVA (cerebrovascular accident) 09/09/2018   Type II diabetes mellitus with renal manifestations ([place] 09/09/2018   CKD (chronic kidney disease), stage III 09/09/2018   Sepsis ([place] 09/09/2018   Depression with anxiety 09/09/2018  Slurred speech 09/09/2018   Ankle fracture, left 05/16/2018   Amiodarone pulmonary toxicity    Physical deconditioning    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  ([place])    Acute on chronic congestive heart failure (HCC)    Chronic combined systolic (congestive) and diastolic (congestive) heart failure ([place]) 20/94/7096   Chronic systolic CHF (congestive heart failure) ([place]) 01/16/2018   Atrial fibrillation ([place]) 10/21/2017   CAD (coronary artery disease) of bypass graft 10/20/2017   Coronary artery disease of bypass graft of native heart with stable angina pectoris (HCC)    Chronic low back pain 08/24/2016   Pain of left thumb 08/05/2016   Nocturnal hypoxia 06/02/2016   Hoarseness 04/05/2016   Laryngopharyngeal reflux (LPR) 04/05/2016   Pharyngoesophageal dysphagia 04/05/2016   Angina decubitus ([place]) 05/22/2015   Chronic insomnia 05/06/2015   Common migraine 05/14/2014   Abnormality of gait 05/14/2014   Memory change 05/14/2014   Aneurysm, cerebral, nonruptured 05/14/2014   Cramp of limb-Left neck 05/10/2014   Hematemesis with nausea    Vomiting blood    Dizziness and giddiness 09/21/2013   Atypical chest pain 07/18/2013   Unstable angina ([place]) 04/09/2013   Carotid artery stenosis, symptomatic 03/20/2013   Cerebrovascular disease 07/10/2012   Cerebral artery occlusion with cerebral infarction ([place]) 07/10/2012   Mitral regurgitation 04/15/2012   TIA (transient ischemic attack) 04/15/2012   Occlusion and stenosis of carotid artery without mention of cerebral infarction 08/18/2011   Bariatric surgery status 06/17/2011   Speech abnormality 03/22/2011   Dyspnea 02/24/2011   PAPILLARY MUSCLE DYSFUNCTION, NON-RHEUMATIC 10/09/2008   UNSPECIFIED VITAMIN D DEFICIENCY 10/24/2007   MYOCARDIAL INFARCTION, HX OF 10/24/2007   PERSISTENT VOMITING 10/24/2007   OSTEOARTHRITIS 10/24/2007   MIGRAINES, HX OF 10/24/2007   Diabetes mellitus type 2, controlled, with complications ([place]) 28/36/6294   Hyperlipemia 01/16/2007   Obesity-post failed open gastroplasty 1984  01/16/2007   OBSTRUCTIVE SLEEP APNEA 01/16/2007   Essential hypertension 01/16/2007    Coronary atherosclerosis 01/16/2007   Asthma 01/16/2007   GERD 01/16/2007   VENTRAL HERNIA 01/16/2007    Raissa Dam,ANGIE, PTA 02/19/2021, 12:17 PM  [place] [place], [place], [address] Phone: [phone]   Fax:  [phone]  Name: [name] [name] [name]: [id number] Date of Birth: 1945-07-09

## 2021-02-24 ENCOUNTER — Ambulatory Visit: Payer: Medicare Other | Admitting: Physical Therapy

## 2021-02-26 ENCOUNTER — Ambulatory Visit: Payer: Medicare Other | Admitting: Physical Therapy

## 2021-02-26 ENCOUNTER — Ambulatory Visit (INDEPENDENT_AMBULATORY_CARE_PROVIDER_SITE_OTHER): Payer: Medicare Other | Admitting: Adult Health

## 2021-02-26 ENCOUNTER — Encounter: Payer: Self-pay | Admitting: Adult Health

## 2021-02-26 ENCOUNTER — Other Ambulatory Visit: Payer: Self-pay

## 2021-02-26 ENCOUNTER — Telehealth: Payer: Self-pay | Admitting: Adult Health

## 2021-02-26 VITALS — BP 136/58 | HR 52 | Ht 62.0 in | Wt 217.0 lb

## 2021-02-26 DIAGNOSIS — R269 Unspecified abnormalities of gait and mobility: Secondary | ICD-10-CM | POA: Diagnosis not present

## 2021-02-26 DIAGNOSIS — R262 Difficulty in walking, not elsewhere classified: Secondary | ICD-10-CM

## 2021-02-26 DIAGNOSIS — R2990 Unspecified symptoms and signs involving the nervous system: Secondary | ICD-10-CM | POA: Diagnosis not present

## 2021-02-26 DIAGNOSIS — I679 Cerebrovascular disease, unspecified: Secondary | ICD-10-CM | POA: Diagnosis not present

## 2021-02-26 DIAGNOSIS — M6281 Muscle weakness (generalized): Secondary | ICD-10-CM

## 2021-02-26 DIAGNOSIS — R5381 Other malaise: Secondary | ICD-10-CM

## 2021-02-26 DIAGNOSIS — R2681 Unsteadiness on feet: Secondary | ICD-10-CM

## 2021-02-26 NOTE — Progress Notes (Addendum)
PATIENT: [name] [name] DOB: 1945-03-31  REASON FOR VISIT: follow up HISTORY FROM: patient PRIMARY NEUROLOGIST: Dr. Jannifer Franklin  HISTORY OF PRESENT ILLNESS: Today 02/26/21:  [name] [name] is a 76 year old female with a history of diabetes associated with diabetic peripheral neuropathy, history of cerebrovascular disease with right posterior cerebral artery distribution stroke in the past.  She returns today for follow-up.  She states that she feels that she has had TIA events since the last visit.  She states that she feels as if she blacks out and does not remember what just happened.  She states that in the last 4 weeks she has noticed increased numbness and tingling on the left side of the body.  Reports that she had a fall Saturday bruising her left arm.  Did not go to the PCP or ED.  She is participating in physical therapy for balance and gait.  Continues to have stuttering speech off and on.  Remains on Eliquis.  Follows with cardiology and her PCP regularly.    HISTORY [name] [name] is a 76 year old right-handed white female with a history of diabetes associated with diabetic peripheral neuropathy.  The patient has history of cerebrovascular disease, she sustained a right posterior cerebral artery distribution stroke in the past.  She does not operate a motor vehicle at this time.  She reports some ongoing decline in memory with short-term memory issues.  She has some dizziness oftentimes with standing.  She uses a walker for ambulation, she has not had any recent falls.  She continues to have some stuttering speech off and on.  She returns for further evaluation.  She did have a carotid Doppler study in August 2021 that was unremarkable  REVIEW OF SYSTEMS: Out of a complete 14 system review of symptoms, the patient complains only of the following symptoms, and all other reviewed systems are negative.  ALLERGIES: Allergies  Allergen Reactions   Amoxicillin Shortness Of Breath and Rash    Brilinta [Ticagrelor] Shortness Of Breath   Erythromycin Shortness Of Breath, Other (See Comments) and Hives    Trouble swallowing   Flagyl [Metronidazole] Shortness Of Breath and Palpitations   Penicillins Hives, Shortness Of Breath, Rash and Other (See Comments)    Has patient had a PCN reaction causing immediate rash, facial/tongue/throat swelling, SOB or lightheadedness with hypotension: Yes Has patient had a PCN reaction causing severe rash involving mucus membranes or skin necrosis: No Has patient had a PCN reaction that required hospitalization: Yes Has patient had a PCN reaction occurring within the last 10 years: No If all of the above answers are "NO", then may proceed with Cephalosporin use.    Isosorbide Mononitrate [Isosorbide Nitrate] Other (See Comments)    Joint aches, muscles hurt, difficult to walk    Jardiance [Empagliflozin] Other (See Comments)    Nausea, joint aches, muscles aches   Metformin And Related Other (See Comments)    Stomach pain, cold sweats, joint pain, burred vision, dizziness   Tape Other (See Comments)    Skin pulls off with certain types Plastic tape causes skin to rip if left on for long periods of time   Erythromycin Base Rash    HOME MEDICATIONS: Outpatient Medications Prior to Visit  Medication Sig Dispense Refill   acetaminophen (TYLENOL) 500 MG tablet Take 500 mg by mouth every 6 (six) hours as needed for mild pain, moderate pain, fever or headache.     albuterol (VENTOLIN HFA) 108 (90 Base) MCG/ACT inhaler Inhale 2 puffs into  the lungs every 4 (four) hours as needed for wheezing or shortness of breath.      Alirocumab (PRALUENT) 150 MG/ML SOAJ Inject 150 mg/mL into the skin every 14 (fourteen) days.     ALPRAZolam (XANAX) 0.5 MG tablet Take 1-2 tablets (0.5-1 mg total) by mouth See admin instructions. Take one tablet (0.5 mg) by mouth every morning and two tablets (1 mg) at night, may also take 1 tablet (0.5 mg) midday as needed for anxiety      apixaban (ELIQUIS) 5 MG TABS tablet Take 1 tablet (5 mg total) by mouth 2 (two) times daily. 180 tablet 1   B Complex-C (B-COMPLEX WITH VITAMIN C) tablet Take 1 tablet by mouth daily.     bisacodyl (DULCOLAX) 5 MG EC tablet Take 5 mg by mouth daily as needed for moderate constipation.     buPROPion (WELLBUTRIN XL) 150 MG 24 hr tablet Take 150 mg by mouth every morning.     cholecalciferol (VITAMIN D3) 25 MCG (1000 UNIT) tablet Take 1,000 Units by mouth daily.     clopidogrel (PLAVIX) 75 MG tablet TAKE ONE TABLET BY MOUTH AT BEDTIME 90 tablet 3   denosumab (PROLIA) 60 MG/ML SOSY injection Inject 60 mg into the skin every 6 (six) months.     DULoxetine (CYMBALTA) 30 MG capsule Take 1 capsule (30 mg total) by mouth daily. 30 capsule 0   ezetimibe (ZETIA) 10 MG tablet Take 1 tablet (10 mg total) by mouth at bedtime.     Fluticasone-Salmeterol (ADVAIR) 250-50 MCG/DOSE AEPB Inhale 1 puff into the lungs daily.     gabapentin (NEURONTIN) 600 MG tablet Take 600 mg by mouth 4 (four) times daily as needed.     Insulin Glargine (LANTUS SOLOSTAR) 100 UNIT/ML Solostar Pen Inject 38-44 Units into the skin See admin instructions. Inject 44 units subcutaneously daily at bedtime, increase to 44 units for CBG >200     insulin lispro (HUMALOG) 100 UNIT/ML KwikPen Inject 14-20 Units into the skin See admin instructions. Inject 14 units subcutaneously prior to breakfast and supper; add 4 units for CBG >200     ipratropium-albuterol (DUONEB) 0.5-2.5 (3) MG/3ML SOLN Take 3 mLs by nebulization 2 (two) times daily as needed (asthma).     methocarbamol (ROBAXIN) 500 MG tablet Take 500 mg by mouth 3 (three) times daily as needed.     Multiple Vitamin (MULTIVITAMIN WITH MINERALS) TABS tablet Take 1 tablet by mouth every morning. Centrum - Women over 55     Multiple Vitamins-Minerals (OCUVITE EYE HEALTH FORMULA) CAPS Take 1 capsule by mouth every morning.     nitroGLYCERIN (NITROSTAT) 0.3 MG SL tablet Place 1 tablet (0.3 mg  total) under the tongue every 5 (five) minutes x 3 doses as needed for chest pain. 25 tablet 1   ondansetron (ZOFRAN-ODT) 4 MG disintegrating tablet Take 4 mg by mouth 2 (two) times daily as needed for nausea or vomiting.     OXYGEN Inhale 2 L into the lungs at bedtime as needed (shortness of breath).      pantoprazole (PROTONIX) 40 MG tablet Take 1 tablet (40 mg total) by mouth daily. 30 tablet 0   Polyvinyl Alcohol-Povidone (REFRESH OP) Place 1 drop into both eyes daily as needed (redness/ dry eyes).     potassium chloride SA (KLOR-CON) 20 MEQ tablet Take 1 tablet (20 mEq total) by mouth daily. 90 tablet 3   Pyridoxine HCl (VITAMIN B-6 PO) Take 1 tablet by mouth every morning.  ranolazine (RANEXA) 1000 MG SR tablet TAKE ONE TABLET BY MOUTH TWICE DAILY 180 tablet 3   rosuvastatin (CRESTOR) 40 MG tablet Take 1 tablet (40 mg total) by mouth at bedtime. 30 tablet 0   sacubitril-valsartan (ENTRESTO) 97-103 MG Take 1 tablet by mouth 2 (two) times daily. 180 tablet 3   spironolactone (ALDACTONE) 25 MG tablet TAKE 1 TABLET BY MOUTH DAILY 90 tablet 3   traZODone (DESYREL) 50 MG tablet Take 1 tablet (50 mg total) by mouth at bedtime. 90 tablet 1   vitamin B-12 (CYANOCOBALAMIN) 1000 MCG tablet Take 1,000 mcg by mouth every morning.      zolpidem (AMBIEN) 10 MG tablet Take 10 mg by mouth at bedtime as needed for sleep.     amLODipine (NORVASC) 5 MG tablet TAKE ONE (1) TABLET BY MOUTH EACH DAY 90 tablet 3   donepezil (ARICEPT) 5 MG tablet Take 1 tablet (5 mg total) by mouth at bedtime. 30 tablet 0   No facility-administered medications prior to visit.    PAST MEDICAL HISTORY: Past Medical History:  Diagnosis Date   Anemia    hx   Anxiety    Asthma    Basal cell carcinoma 05/2014   "left shoulder"   Bundle branch block, left    chronic/notes 07/18/2013   CHF (congestive heart failure) (HCC)    Chronic insomnia 05/06/2015   Chronic kidney disease    frequency, sees dr Jamal Maes every 4 to 6  months (01/16/2018)   Chronic lower back pain    Claustrophobia    Common migraine 05/14/2014   Coronary artery disease    MI in 2001, 2002, 2006, 2011, 2014   Depression    Diabetic peripheral neuropathy ([place]) 01/12/2019   GERD (gastroesophageal reflux disease)    H/O hiatal hernia    Headache    "at least 2/month" (01/16/2018)   Heart murmur    Hyperlipidemia    Hypertension    Memory change 05/14/2014   Migraine    "5-6/year"  (01/16/2018)   Obesity 01-2010   Obstructive sleep apnea    "can't wear machine; I have claustrophobia" (01/16/2018), states she had a 2nd sleep study and does not have sleep apnea, her O2 decreases and now is on 2 L of O2 at night.   On home oxygen therapy    "2L at night and prn during daytime" (01/16/2018)   Osteoarthritis    "knees and hands" (01/16/2018)   Peripheral vascular disease ([place])    ? numbness, tingling arms and legs   PONV (postoperative nausea and vomiting)    Stroke ([place])    2014, 2015, 2016   Type II diabetes mellitus ([place])    Ventral hernia    hx of    PAST SURGICAL HISTORY: Past Surgical History:  Procedure Laterality Date   ANKLE FRACTURE SURGERY Left 1970's   APPENDECTOMY  1970's   w/hysterectomy   BASAL CELL CARCINOMA EXCISION Left 05/2014   "shoulder" (01/16/2018)   CARDIAC CATHETERIZATION  10/10/2012   Dr [name].   CARDIAC CATHETERIZATION N/A 05/29/2015   Procedure: Right/Left Heart Cath and Coronary/Graft Angiography;  Surgeon: Larey Dresser, MD;  Location: [facility];  Service: Cardiovascular;  Laterality: N/A;   CAROTID ENDARTERECTOMY Left 03/2013   CAROTID STENT INSERTION Left 03/20/2013   Procedure: CAROTID STENT INSERTION;  Surgeon: [name], MD;  Location: [facility] CATH LAB;  Service: Cardiovascular;  Laterality: Left;  internal carotid   CEREBRAL ANGIOGRAM N/A 04/05/2011   Procedure:  CEREBRAL ANGIOGRAM;  Surgeon: Angelia Mould, MD;  Location: [facility] CATH LAB;  Service: Cardiovascular;  Laterality:  N/A;   CHOLECYSTECTOMY OPEN  2004   CORONARY ANGIOPLASTY WITH STENT PLACEMENT  01,02,05,06,07,08,11; 04/24/2013   "I've probably got ~ 10 stents by now" (04/24/2013)   CORONARY ANGIOPLASTY WITH STENT PLACEMENT  06/13/2013   "got 4 stents today" (06/13/2013)   CORONARY ARTERY BYPASS GRAFT  1220/11   "CABG X5"   CORONARY STENT INTERVENTION N/A 10/20/2017   Procedure: CORONARY STENT INTERVENTION;  Surgeon: Troy Sine, MD;  Location: [facility];  Service: Cardiovascular;  Laterality: N/A;   ESOPHAGOGASTRODUODENOSCOPY  08/03/2011   Procedure: ESOPHAGOGASTRODUODENOSCOPY (EGD);  Surgeon: Shann Medal, MD;  Location: [facility];  Service: General;  Laterality: N/A;   ESOPHAGOGASTRODUODENOSCOPY (EGD) WITH PROPOFOL N/A 03/11/2014   Procedure: ESOPHAGOGASTRODUODENOSCOPY (EGD) WITH PROPOFOL;  Surgeon: Lafayette Dragon, MD;  Location: [facility];  Service: Endoscopy;  Laterality: N/A;   FRACTURE SURGERY     gall stone removal  05/2003   GASTRIC RESTRICTION SURGERY  1984   "stapeling"   HERNIA REPAIR  2004   "in my stomach; had OR on it twice", wire mesh on 1 hernia   LEFT HEART CATH AND CORS/GRAFTS ANGIOGRAPHY N/A 07/09/2016   Procedure: LEFT HEART CATH AND CORS/GRAFTS ANGIOGRAPHY;  Surgeon: Larey Dresser, MD;  Location: [facility];  Service: Cardiovascular;  Laterality: N/A;   ORIF ANKLE FRACTURE Left 05/16/2018   ORIF ANKLE FRACTURE Left 05/16/2018   Procedure: OPEN REDUCTION INTERNAL FIXATION (ORIF) Left ankle with possible syndesmosis fixation;  Surgeon: Nicholes Stairs, MD;  Location: [facility];  Service: Orthopedics;  Laterality: Left;  193min   OVARY SURGERY  1970's   "tumor removed"   PERCUTANEOUS CORONARY STENT INTERVENTION (PCI-S) N/A 06/13/2013   Procedure: PERCUTANEOUS CORONARY STENT INTERVENTION (PCI-S);  Surgeon: [name], MD;  Location: [facility] CATH LAB;  Service: Cardiovascular;  Laterality: N/A;   PERCUTANEOUS STENT INTERVENTION N/A 04/24/2013   Procedure:  PERCUTANEOUS STENT INTERVENTION;  Surgeon: [name], MD;  Location: [facility] CATH LAB;  Service: Cardiovascular;  Laterality: N/A;   RIGHT/LEFT HEART CATH AND CORONARY/GRAFT ANGIOGRAPHY N/A 10/20/2017   Procedure: RIGHT/LEFT HEART CATH AND CORONARY/GRAFT ANGIOGRAPHY;  Surgeon: Larey Dresser, MD;  Location: [place];  Service: Cardiovascular;  Laterality: N/A;   ROOT CANAL  10/2000   TEE WITHOUT CARDIOVERSION N/A 01/18/2019   Procedure: TRANSESOPHAGEAL ECHOCARDIOGRAM (TEE);  Surgeon: Larey Dresser, MD;  Location: [facility] ENDOSCOPY;  Service: Cardiovascular;  Laterality: N/A;   TIBIA FRACTURE SURGERY Right 1970's   rods and pins   TOOTH EXTRACTION     "1 on the upper; wisdom tooth on the lower" (01/16/2018)   TOTAL ABDOMINAL HYSTERECTOMY  1970's   w/ appendectomy    FAMILY HISTORY: Family History  Problem Relation Age of Onset   Hypertension Mother    Deep vein thrombosis Mother    AAA (abdominal aortic aneurysm) Mother    Dementia Mother    Heart disease Father        Heart Disease before age 63   Diabetes Father    Hyperlipidemia Father    Hypertension Father    Heart attack Father    Deep vein thrombosis Father    AAA (abdominal aortic aneurysm) Father    Cancer Sister        Ovarian   Hypertension Sister    Diabetes Paternal Aunt    Heart disease Paternal Uncle  Diabetes Paternal Uncle    Diabetes Paternal Uncle    Dementia Maternal Grandmother    Diabetes Paternal Grandmother    Heart attack Paternal Grandfather 40       died of MI at 9   Colon cancer Neg Hx     SOCIAL HISTORY: Social History   Socioeconomic History   Marital status: Married    Spouse name: [name]    Number of children: 1   Years of education: 12+   Highest education level: Not on file  Occupational History   Occupation: retired  Tobacco Use   Smoking status: Never   Smokeless tobacco: Never  Vaping Use   Vaping Use: Never used  Substance and Sexual Activity   Alcohol  use: Never   Drug use: Never   Sexual activity: Not Currently    Birth control/protection: Surgical    Comment: hysterectomy  Other Topics Concern   Not on file  Social History Narrative   Patient lives at home with husband [name].    Patient is right handed.   Patient drinks caffeine occasionally.   Social Determinants of Health   Financial Resource Strain: Not on file  Food Insecurity: Not on file  Transportation Needs: Not on file  Physical Activity: Not on file  Stress: Not on file  Social Connections: Not on file  Intimate Partner Violence: Not on file      PHYSICAL EXAM  Vitals:   02/26/21 1102  BP: (!) 136/58  Pulse: (!) 52  Weight: 217 lb (98.4 kg)  Height: 5\' 2"  (1.575 m)   Body mass index is 39.69 kg/m.  MMSE - Mini Mental State Exam 02/26/2021  Orientation to time 3  Orientation to Place 4  Registration 3  Attention/ Calculation 2  Recall 3  Language- name 2 objects 2  Language- repeat 1  Language- follow 3 step command 3  Language- read & follow direction 1  Write a sentence 1  Copy design 0  Total score 23     Generalized: Well developed, in no acute distress   Neurological examination  Mentation: Alert oriented to time, place, history taking. Follows all commands speech and language fluent Cranial nerve II-XII: Pupils were equal round reactive to light. Extraocular movements were full, visual field were full on confrontational test. Facial sensation and strength were normal. Uvula tongue midline. Head turning and shoulder shrug  were normal and symmetric. Motor: The motor testing reveals 5 over 5 strength in the right upper and lower extremity.  3/5 strength in the left upper extremity-weakness also may be contributed to recent fall with bruising on the left arm.  4 out of 5 strength in the left lower extremity. Sensory: Sensory testing is intact to soft touch on all 4 extremities. No evidence of extinction is noted.  Coordination: Cerebellar  testing reveals good finger-nose-finger on the right.  Dysmetria noted on the left.  Good heel-to-shin bilaterally.  Gait and station: Uses a Rollator when ambulating.  Tandem gait not attempted.   DIAGNOSTIC DATA (LABS, IMAGING, TESTING) - I reviewed patient records, labs, notes, testing and imaging myself where available.  Lab Results  Component Value Date   WBC 16.7 (H) 08/10/2020   HGB 14.0 08/10/2020   HCT 41.4 08/10/2020   MCV 92.2 08/10/2020   PLT 209 08/10/2020      Component Value Date/Time   NA 138 01/06/2021 1445   K 4.4 01/06/2021 1445   CL 104 01/06/2021 1445   CO2 24 01/06/2021 1445  GLUCOSE 200 (H) 01/06/2021 1445   BUN 21 01/06/2021 1445   CREATININE 1.13 (H) 01/06/2021 1445   CREATININE 1.07 (H) 05/28/2015 1230   CALCIUM 9.3 01/06/2021 1445   PROT 6.7 12/23/2018 1339   PROT 6.5 11/13/2014 1151   ALBUMIN 3.5 12/23/2018 1339   ALBUMIN 4.3 11/13/2014 1151   AST 23 12/23/2018 1339   ALT 17 12/23/2018 1339   ALKPHOS 84 12/23/2018 1339   BILITOT 0.5 12/23/2018 1339   BILITOT 0.5 11/13/2014 1151   GFRNONAA 51 (L) 01/06/2021 1445   GFRAA 50 (L) 11/05/2019 1244   Lab Results  Component Value Date   CHOL 121 05/13/2020   HDL 51 05/13/2020   LDLCALC 58 05/13/2020   LDLDIRECT 156.7 02/06/2014   TRIG 58 05/13/2020   CHOLHDL 2.4 05/13/2020   Lab Results  Component Value Date   HGBA1C 7.2 (H) 01/17/2019   No results found for: VITAMINB12 Lab Results  Component Value Date   TSH 0.982 01/17/2019      ASSESSMENT AND PLAN 76 y.o. year old female  has a past medical history of Anemia, Anxiety, Asthma, Basal cell carcinoma (05/2014), Bundle branch block, left, CHF (congestive heart failure) ([place]), Chronic insomnia (05/06/2015), Chronic kidney disease, Chronic lower back pain, Claustrophobia, Common migraine (05/14/2014), Coronary artery disease, Depression, Diabetic peripheral neuropathy ([place]) (01/12/2019), GERD (gastroesophageal reflux disease), H/O hiatal  hernia, Headache, Heart murmur, Hyperlipidemia, Hypertension, Memory change (05/14/2014), Migraine, Obesity (01-2010), Obstructive sleep apnea, On home oxygen therapy, Osteoarthritis, Peripheral vascular disease ([place]), PONV (postoperative nausea and vomiting), Stroke ([place]), Type II diabetes mellitus ([place]), and Ventral hernia. here with:  1.  History of stroke 2.  Strokelike symptoms-blackout episodes, left-sided numbness and tingling  Repeat MRI of the brain without contrast -elevated creatinine EEG ordered due to blackout episodes rule out possible seizure events Continue to manage stroke risk factors with cardiologist and PCP.  Advised to keep blood pressure goal less than 130/90, cholesterol LDL less than 70 and hemoglobin A1c less than 6.5%. Currently on Eliquis  3.  Memory disturbance  Cannot tolerate Aricept MMSE today 23 out of 30 Will monitor for now may consider adding on Namenda pending results of MRI and EEG   Patient was established with Dr. Jannifer Franklin.  He has since retired.  She will get established with Dr. [name] as her new primary neurologist  [name] Givens, MSN, NP-C 02/26/2021, 11:15 AM [facility] [address], [place], [place] [address] [phone]     Reason for visit: Peripheral neuropathy, gait disturbance, cerebrovascular disease, memory disorder  JADON RESSLER is an 76 y.o. female  History of present illness:  [name] [name] is a 76 year old right-handed white female with a history of diabetes associated with diabetic peripheral neuropathy.  The patient has history of cerebrovascular disease, she sustained a right posterior cerebral artery distribution stroke in the past.  She does not operate a motor vehicle at this time.  She reports some ongoing decline in memory with short-term memory issues.  She has some dizziness oftentimes with standing.  She uses a walker for ambulation, she has not had any recent falls.  She continues to have  some stuttering speech off and on.  She returns for further evaluation.  She did have a carotid Doppler study in August 2021 that was unremarkable.  Past Medical History:  Diagnosis Date   Anemia    hx   Anxiety    Asthma    Basal cell carcinoma 05/2014   "left shoulder"  Bundle branch block, left    chronic/notes 07/18/2013   CHF (congestive heart failure) (HCC)    Chronic insomnia 05/06/2015   Chronic kidney disease    frequency, sees dr Jamal Maes every 4 to 6 months (01/16/2018)   Chronic lower back pain    Claustrophobia    Common migraine 05/14/2014   Coronary artery disease    MI in 2001, 2002, 2006, 2011, 2014   Depression    Diabetic peripheral neuropathy ([place]) 01/12/2019   GERD (gastroesophageal reflux disease)    H/O hiatal hernia    Headache    "at least 2/month" (01/16/2018)   Heart murmur    Hyperlipidemia    Hypertension    Memory change 05/14/2014   Migraine    "5-6/year"  (01/16/2018)   Obesity 01-2010   Obstructive sleep apnea    "can't wear machine; I have claustrophobia" (01/16/2018), states she had a 2nd sleep study and does not have sleep apnea, her O2 decreases and now is on 2 L of O2 at night.   On home oxygen therapy    "2L at night and prn during daytime" (01/16/2018)   Osteoarthritis    "knees and hands" (01/16/2018)   Peripheral vascular disease ([place])    ? numbness, tingling arms and legs   PONV (postoperative nausea and vomiting)    Stroke ([place])    2014, 2015, 2016   Type II diabetes mellitus ([place])    Ventral hernia    hx of    Past Surgical History:  Procedure Laterality Date   ANKLE FRACTURE SURGERY Left 1970's   APPENDECTOMY  1970's   w/hysterectomy   BASAL CELL CARCINOMA EXCISION Left 05/2014   "shoulder" (01/16/2018)   CARDIAC CATHETERIZATION  10/10/2012   Dr [name].   CARDIAC CATHETERIZATION N/A 05/29/2015   Procedure: Right/Left Heart Cath and Coronary/Graft Angiography;  Surgeon: Larey Dresser, MD;  Location: [facility]  CV LAB;  Service: Cardiovascular;  Laterality: N/A;   CAROTID ENDARTERECTOMY Left 03/2013   CAROTID STENT INSERTION Left 03/20/2013   Procedure: CAROTID STENT INSERTION;  Surgeon: [name], MD;  Location: [facility] CATH LAB;  Service: Cardiovascular;  Laterality: Left;  internal carotid   CEREBRAL ANGIOGRAM N/A 04/05/2011   Procedure: CEREBRAL ANGIOGRAM;  Surgeon: Angelia Mould, MD;  Location: [facility] CATH LAB;  Service: Cardiovascular;  Laterality: N/A;   CHOLECYSTECTOMY OPEN  2004   CORONARY ANGIOPLASTY WITH STENT PLACEMENT  01,02,05,06,07,08,11; 04/24/2013   "I've probably got ~ 10 stents by now" (04/24/2013)   CORONARY ANGIOPLASTY WITH STENT PLACEMENT  06/13/2013   "got 4 stents today" (06/13/2013)   CORONARY ARTERY BYPASS GRAFT  1220/11   "CABG X5"   CORONARY STENT INTERVENTION N/A 10/20/2017   Procedure: CORONARY STENT INTERVENTION;  Surgeon: Troy Sine, MD;  Location: [facility];  Service: Cardiovascular;  Laterality: N/A;   ESOPHAGOGASTRODUODENOSCOPY  08/03/2011   Procedure: ESOPHAGOGASTRODUODENOSCOPY (EGD);  Surgeon: Shann Medal, MD;  Location[facility];  Service: General;  Laterality: N/A;   ESOPHAGOGASTRODUODENOSCOPY (EGD) WITH PROPOFOL N/A 03/11/2014   Procedure: ESOPHAGOGASTRODUODENOSCOPY (EGD) WITH PROPOFOL;  Surgeon: Lafayette Dragon, MD;  Location: [facility] ENDOSCOPY;  Service: Endoscopy;  Laterality: N/A;   FRACTURE SURGERY     gall stone removal  05/2003   GASTRIC RESTRICTION SURGERY  1984   "stapeling"   HERNIA REPAIR  2004   "in my stomach; had OR on it twice", wire mesh on 1 hernia   LEFT HEART CATH AND CORS/GRAFTS ANGIOGRAPHY N/A 07/09/2016  Procedure: LEFT HEART CATH AND CORS/GRAFTS ANGIOGRAPHY;  Surgeon: Larey Dresser, MD;  Location: [facility] [facility];  Service: Cardiovascular;  Laterality: N/A;   ORIF ANKLE FRACTURE Left 05/16/2018   ORIF ANKLE FRACTURE Left 05/16/2018   Procedure: OPEN REDUCTION INTERNAL FIXATION (ORIF) Left ankle with possible syndesmosis  fixation;  Surgeon: Nicholes Stairs, MD;  Location: [facility];  Service: Orthopedics;  Laterality: Left;  168min   OVARY SURGERY  1970's   "tumor removed"   PERCUTANEOUS CORONARY STENT INTERVENTION (PCI-S) N/A 06/13/2013   Procedure: PERCUTANEOUS CORONARY STENT INTERVENTION (PCI-S);  Surgeon: [name], MD;  Location: [facility] CATH LAB;  Service: Cardiovascular;  Laterality: N/A;   PERCUTANEOUS STENT INTERVENTION N/A 04/24/2013   Procedure: PERCUTANEOUS STENT INTERVENTION;  Surgeon: [name], MD;  Location: [facility] CATH LAB;  Service: Cardiovascular;  Laterality: N/A;   RIGHT/LEFT HEART CATH AND CORONARY/GRAFT ANGIOGRAPHY N/A 10/20/2017   Procedure: RIGHT/LEFT HEART CATH AND CORONARY/GRAFT ANGIOGRAPHY;  Surgeon: Larey Dresser, MD;  Location: [facility];  Service: Cardiovascular;  Laterality: N/A;   ROOT CANAL  10/2000   TEE WITHOUT CARDIOVERSION N/A 01/18/2019   Procedure: TRANSESOPHAGEAL ECHOCARDIOGRAM (TEE);  Surgeon: Larey Dresser, MD;  Location: [facility] ENDOS[facility];  Service: Cardiovascular;  Laterality: N/A;   TIBIA FRACTURE SURGERY Right 1970's   rods and pins   TOOTH EXTRACTION     "1 on the upper; wisdom tooth on the lower" (01/16/2018)   TOTAL ABDOMINAL HYSTERECTOMY  1970's   w/ appendectomy    Family History  Problem Relation Age of Onset   Hypertension Mother    Deep vein thrombosis Mother    AAA (abdominal aortic aneurysm) Mother    Dementia Mother    Heart disease Father        Heart Disease before age 63   Diabetes Father    Hyperlipidemia Father    Hypertension Father    Heart attack Father    Deep vein thrombosis Father    AAA (abdominal aortic aneurysm) Father    Cancer Sister        Ovarian   Hypertension Sister    Diabetes Paternal Aunt    Heart disease Paternal Uncle    Diabetes Paternal Uncle    Diabetes Paternal Uncle    Dementia Maternal Grandmother    Diabetes Paternal Grandmother    Heart attack Paternal Grandfather 24       died of  MI at 11   Colon cancer Neg Hx     Social history:  reports that she has never smoked. She has never used smokeless tobacco. She reports that she does not drink alcohol and does not use drugs.    Allergies  Allergen Reactions   Amoxicillin Shortness Of Breath and Rash   Brilinta [Ticagrelor] Shortness Of Breath   Erythromycin Shortness Of Breath, Other (See Comments) and Hives    Trouble swallowing   Flagyl [Metronidazole] Shortness Of Breath and Palpitations   Penicillins Hives, Shortness Of Breath, Rash and Other (See Comments)    Has patient had a PCN reaction causing immediate rash, facial/tongue/throat swelling, SOB or lightheadedness with hypotension: Yes Has patient had a PCN reaction causing severe rash involving mucus membranes or skin necrosis: No Has patient had a PCN reaction that required hospitalization: Yes Has patient had a PCN reaction occurring within the last 10 years: No If all of the above answers are "NO", then may proceed with Cephalosporin use.    Isosorbide Mononitrate [Isosorbide Nitrate] Other (See  Comments)    Joint aches, muscles hurt, difficult to walk    Jardiance [Empagliflozin] Other (See Comments)    Nausea, joint aches, muscles aches   Metformin And Related Other (See Comments)    Stomach pain, cold sweats, joint pain, burred vision, dizziness   Tape Other (See Comments)    Skin pulls off with certain types Plastic tape causes skin to rip if left on for long periods of time   Erythromycin Base Rash    Medications:  Prior to Admission medications   Medication Sig Start Date End Date Taking? Authorizing Provider  acetaminophen (TYLENOL) 500 MG tablet Take 500 mg by mouth every 6 (six) hours as needed for mild pain, moderate pain, fever or headache.   Yes [provider]  albuterol (VENTOLIN HFA) 108 (90 Base) MCG/ACT inhaler Inhale 2 puffs into the lungs every 4 (four) hours as needed for wheezing or shortness of breath.    Yes [provider]  Alirocumab (PRALUENT) 150 MG/ML SOAJ Inject 150 mg/mL into the skin every 14 (fourteen) days.   Yes [provider]  ALPRAZolam Duanne Moron) 0.5 MG tablet Take 1-2 tablets (0.5-1 mg total) by mouth See admin instructions. Take one tablet (0.5 mg) by mouth every morning and two tablets (1 mg) at night, may also take 1 tablet (0.5 mg) midday as needed for anxiety 01/19/19  Yes Aline August, MD  amLODipine (NORVASC) 10 MG tablet TAKE ONE (1) TABLET BY MOUTH EACH DAY 02/21/20  Yes Larey Dresser, MD  apixaban (ELIQUIS) 5 MG TABS tablet Take 1 tablet (5 mg total) by mouth 2 (two) times daily. 09/11/19  Yes Larey Dresser, MD  B Complex-C (B-COMPLEX WITH VITAMIN C) tablet Take 1 tablet by mouth daily.   Yes [provider]  bisacodyl (DULCOLAX) 5 MG EC tablet Take 5 mg by mouth daily as needed for moderate constipation.   Yes [provider]  buPROPion (WELLBUTRIN XL) 150 MG 24 hr tablet Take 150 mg by mouth every morning. 01/25/19  Yes [provider]  cholecalciferol (VITAMIN D3) 25 MCG (1000 UNIT) tablet Take 1,000 Units by mouth daily.   Yes [provider]  clopidogrel (PLAVIX) 75 MG tablet TAKE ONE TABLET BY MOUTH AT BEDTIME 03/21/20  Yes Larey Dresser, MD  denosumab (PROLIA) 60 MG/ML SOSY injection Inject 60 mg into the skin every 6 (six) months.    Yes [provider]  DULoxetine (CYMBALTA) 30 MG capsule Take 1 capsule (30 mg total) by mouth daily. 09/28/18  Yes Angiulli, Lavon Paganini, PA-C  ezetimibe (ZETIA) 10 MG tablet Take 1 tablet (10 mg total) by mouth at bedtime. 04/10/18  Yes Cherene Altes, MD  Fluticasone-Salmeterol (ADVAIR) 250-50 MCG/DOSE AEPB Inhale 1 puff into the lungs daily.   Yes [provider]  gabapentin (NEURONTIN) 600 MG tablet Take 600 mg by mouth 4 (four) times daily as needed. 07/04/19  Yes [provider]  Insulin Glargine (LANTUS SOLOSTAR) 100 UNIT/ML Solostar Pen Inject 38-44 Units into  the skin See admin instructions. Inject 44 units subcutaneously daily at bedtime, increase to 44 units for CBG >200   Yes [provider]  insulin lispro (HUMALOG) 100 UNIT/ML KwikPen Inject 14-20 Units into the skin See admin instructions. Inject 14 units subcutaneously prior to breakfast and supper; add 4 units for CBG >200   Yes [provider]  ipratropium-albuterol (DUONEB) 0.5-2.5 (3) MG/3ML SOLN Take 3 mLs by nebulization 2 (two) times daily as needed (asthma).  01/19/19  Yes Aline August, MD  methocarbamol (ROBAXIN) 500 MG tablet Take 500 mg by mouth 3 (three) times daily as needed. 05/25/19  Yes [provider]  Multiple Vitamin (MULTIVITAMIN WITH MINERALS) TABS tablet Take 1 tablet by mouth every morning. Centrum - Women over 47   Yes [provider]  Multiple Vitamins-Minerals ([place]) CAPS Take 1 capsule by mouth every morning.   Yes [provider]  nitroGLYCERIN (NITROSTAT) 0.3 MG SL tablet Place 1 tablet (0.3 mg total) under the tongue every 5 (five) minutes x 3 doses as needed for chest pain. 05/13/20  Yes Larey Dresser, MD  ondansetron (ZOFRAN-ODT) 4 MG disintegrating tablet Take 4 mg by mouth 2 (two) times daily as needed for nausea or vomiting.   Yes [provider]  OXYGEN Inhale 2 L into the lungs at bedtime as needed (shortness of breath).    Yes [provider]  pantoprazole (PROTONIX) 40 MG tablet Take 1 tablet (40 mg total) by mouth daily. 09/28/18  Yes Angiulli, Lavon Paganini, PA-C  Polyvinyl Alcohol-Povidone (REFRESH OP) Place 1 drop into both eyes daily as needed (redness/ dry eyes).   Yes [provider]  potassium chloride SA (KLOR-CON) 20 MEQ tablet TAKE 2 TABLETS BY MOUTH EVERY MORNING AND 1 TABLET EVERY EVENING 03/21/20  Yes Larey Dresser, MD  Pyridoxine HCl (VITAMIN B-6 PO) Take 1 tablet by mouth every morning.   Yes [provider]  ranolazine (RANEXA) 1000 MG SR tablet  TAKE ONE TABLET BY MOUTH TWICE DAILY 01/11/20  Yes Larey Dresser, MD  rosuvastatin (CRESTOR) 40 MG tablet Take 1 tablet (40 mg total) by mouth at bedtime. 09/28/18  Yes Angiulli, Lavon Paganini, PA-C  sacubitril-valsartan (ENTRESTO) 24-26 MG Take 1 tablet by mouth 2 (two) times daily. 05/13/20  Yes Larey Dresser, MD  spironolactone (ALDACTONE) 25 MG tablet TAKE 1 TABLET BY MOUTH DAILY 02/21/20  Yes Larey Dresser, MD  torsemide (DEMADEX) 20 MG tablet Take 4 tablets (80 mg total) by mouth 2 (two) times daily. 09/11/19  Yes Larey Dresser, MD  traZODone (DESYREL) 50 MG tablet Take 1 tablet (50 mg total) by mouth at bedtime. 11/20/19  Yes Suzzanne Cloud, NP  triamcinolone cream (KENALOG) 0.1 % Apply 1 application topically daily as needed (crusty spots on arms).   Yes [provider]  vitamin B-12 (CYANOCOBALAMIN) 1000 MCG tablet Take 1,000 mcg by mouth every morning.    Yes [provider]  zolpidem (AMBIEN) 10 MG tablet Take 0.5 tablets (5 mg total) by mouth at bedtime. 12/26/18  Yes Vann, Jessica U, DO    ROS:  Out of a complete 14 system review of symptoms, the patient complains only of the following symptoms, and all other reviewed systems are negative.  Walking difficulty Memory problems  Blood pressure (!) 136/58, pulse (!) 52, height 5\' 2"  (1.575 m), weight 217 lb (98.4 kg).  Physical Exam  General: The patient is alert and cooperative at the time of the examination.  The patient is markedly obese.  Skin: No significant peripheral edema is noted.   Neurologic Exam  Mental status: The patient is alert and oriented x 3 at the time of the examination. The patient has apparent normal recent and remote memory, with an apparently normal attention span and concentration ability.   Cranial nerves: Facial symmetry is present. Speech is normal, no aphasia or dysarthria is noted. Extraocular movements are full. Visual fields are full.  Motor: The patient has good  strength in all 4 extremities.  Sensory examination: Soft touch sensation is symmetric on the face, arms, and legs.  Coordination: The patient has good finger-nose-finger and heel-to-shin bilaterally.  Gait and station: The patient is able to walk slowly with a walker, slow with turns.  Romberg is negative.  Tandem gait was not attempted.  Reflexes: Deep tendon reflexes are symmetric.   Assessment/Plan:  1.  Cerebrovascular disease  2.  Stuttering speech pattern, likely nonorganic  3. Stroke-like symptoms  4. Falls  - Repeat MRI Brain to r/o infarct - EEG to r/o seizure events - Currently on Eliquis - Stroke risk factors managed by PCP/Cardiology - advised that if she has any additional Stroke like symptoms to go to the ED or all [place] NP-C 02/26/2021 11:16 AM  [facility] [address] [place], [place] [address]  Phone [phone] Fax [phone]

## 2021-02-26 NOTE — Therapy (Signed)
[place] [place], [place], [address] Phone: [phone]   Fax:  [phone]  Physical Therapy Treatment  Patient Details  Name: [name] [name] [name]: [id number] Date of Birth: 05-10-1945 Referring Provider (PT): Dr Loralie Champagne   Encounter Date: 02/26/2021   PT End of Session - 02/26/21 1429     Visit Number 29    Date for PT Re-Evaluation 03/06/21    PT Start Time 1345    PT Stop Time 1430    PT Time Calculation (min) 45 min             Past Medical History:  Diagnosis Date   Anemia    hx   Anxiety    Asthma    Basal cell carcinoma 05/2014   "left shoulder"   Bundle branch block, left    chronic/notes 07/18/2013   CHF (congestive heart failure) ([place])    Chronic insomnia 05/06/2015   Chronic kidney disease    frequency, sees dr Jamal Maes every 4 to 6 months (01/16/2018)   Chronic lower back pain    Claustrophobia    Common migraine 05/14/2014   Coronary artery disease    MI in 2001, 2002, 2006, 2011, 2014   Depression    Diabetic peripheral neuropathy ([place]) 01/12/2019   GERD (gastroesophageal reflux disease)    H/O hiatal hernia    Headache    "at least 2/month" (01/16/2018)   Heart murmur    Hyperlipidemia    Hypertension    Memory change 05/14/2014   Migraine    "5-6/year"  (01/16/2018)   Obesity 01-2010   Obstructive sleep apnea    "can't wear machine; I have claustrophobia" (01/16/2018), states she had a 2nd sleep study and does not have sleep apnea, her O2 decreases and now is on 2 L of O2 at night.   On home oxygen therapy    "2L at night and prn during daytime" (01/16/2018)   Osteoarthritis    "knees and hands" (01/16/2018)   Peripheral vascular disease ([place])    ? numbness, tingling arms and legs   PONV (postoperative nausea and vomiting)    Stroke ([place])    2014, 2015, 2016   Type II diabetes mellitus ([place])    Ventral hernia    hx of    Past Surgical History:   Procedure Laterality Date   ANKLE FRACTURE SURGERY Left 1970's   APPENDECTOMY  1970's   w/hysterectomy   BASAL CELL CARCINOMA EXCISION Left 05/2014   "shoulder" (01/16/2018)   CARDIAC CATHETERIZATION  10/10/2012   Dr [name].   CARDIAC CATHETERIZATION N/A 05/29/2015   Procedure: Right/Left Heart Cath and Coronary/Graft Angiography;  Surgeon: Larey Dresser, MD;  Location: [facility];  Service: Cardiovascular;  Laterality: N/A;   CAROTID ENDARTERECTOMY Left 03/2013   CAROTID STENT INSERTION Left 03/20/2013   Procedure: CAROTID STENT INSERTION;  Surgeon: [name], MD;  Location: [facility] CATH LAB;  Service: Cardiovascular;  Laterality: Left;  internal carotid   CEREBRAL ANGIOGRAM N/A 04/05/2011   Procedure: CEREBRAL ANGIOGRAM;  Surgeon: Angelia Mould, MD;  Location: [facility] CATH LAB;  Service: Cardiovascular;  Laterality: N/A;   CHOLECYSTECTOMY OPEN  2004   CORONARY ANGIOPLASTY WITH STENT PLACEMENT  01,02,05,06,07,08,11; 04/24/2013   "I've probably got ~ 10 stents by now" (04/24/2013)   CORONARY ANGIOPLASTY WITH STENT PLACEMENT  06/13/2013   "got 4 stents today" (06/13/2013)   CORONARY ARTERY BYPASS GRAFT  1220/11   "CABG  X5"   CORONARY STENT INTERVENTION N/A 10/20/2017   Procedure: CORONARY STENT INTERVENTION;  Surgeon: Troy Sine, MD;  Location: [facility];  Service: Cardiovascular;  Laterality: N/A;   ESOPHAGOGASTRODUODENOSCOPY  08/03/2011   Procedure: ESOPHAGOGASTRODUODENOSCOPY (EGD);  Surgeon: Shann Medal, MD;  Location: [facility]OS[facility];  Service: General;  Laterality: N/A;   ESOPHAGOGASTRODUODENOSCOPY (EGD) WITH PROPOFOL N/A 03/11/2014   Procedure: ESOPHAGOGASTRODUODENOSCOPY (EGD) WITH PROPOFOL;  Surgeon: Lafayette Dragon, MD;  Location: [facility] ENDOSCOPY;  Service: Endoscopy;  Laterality: N/A;   FRACTURE SURGERY     gall stone removal  05/2003   GASTRIC RESTRICTION SURGERY  1984   "stapeling"   HERNIA REPAIR  2004   "in my stomach; had OR on it twice", wire mesh on 1 hernia    LEFT HEART CATH AND CORS/GRAFTS ANGIOGRAPHY N/A 07/09/2016   Procedure: LEFT HEART CATH AND CORS/GRAFTS ANGIOGRAPHY;  Surgeon: Larey Dresser, MD;  Location: [facility];  Service: Cardiovascular;  Laterality: N/A;   ORIF ANKLE FRACTURE Left 05/16/2018   ORIF ANKLE FRACTURE Left 05/16/2018   Procedure: OPEN REDUCTION INTERNAL FIXATION (ORIF) Left ankle with possible syndesmosis fixation;  Surgeon: Nicholes Stairs, MD;  Location: [facility];  Service: Orthopedics;  Laterality: Left;  121mn   OVARY SURGERY  1970's   "tumor removed"   PERCUTANEOUS CORONARY STENT INTERVENTION (PCI-S) N/A 06/13/2013   Procedure: PERCUTANEOUS CORONARY STENT INTERVENTION (PCI-S);  Surgeon: JJettie Booze MD;  [facility];  Service: Cardiovascular;  Laterality: N/A;   PERCUTANEOUS STENT INTERVENTION N/A 04/24/2013   Procedure: PERCUTANEOUS STENT INTERVENTION;  Surgeon: JJettie Booze MD;  [facility];  Service: Cardiovascular;  Laterality: N/A;   RIGHT/LEFT HEART CATH AND CORONARY/GRAFT ANGIOGRAPHY N/A 10/20/2017   Procedure: RIGHT/LEFT HEART CATH AND CORONARY/GRAFT ANGIOGRAPHY;  Surgeon: MLarey Dresser MD;  [facility];  Service: Cardiovascular;  Laterality: N/A;   ROOT CANAL  10/2000   TEE WITHOUT CARDIOVERSION N/A 01/18/2019   Procedure: TRANSESOPHAGEAL ECHOCARDIOGRAM (TEE);  Surgeon: MLarey Dresser MD;  [facility];  Service: Cardiovascular;  Laterality: N/A;   TIBIA FRACTURE SURGERY Right 1970's   rods and pins   TOOTH EXTRACTION     "1 on the upper; wisdom tooth on the lower" (01/16/2018)   TOTAL ABDOMINAL HYSTERECTOMY  1970's   w/ appendectomy    There were no vitals filed for this visit.   Subjective Assessment - 02/26/21 1353     Subjective awful fall Saturday. not even sure what happen , in kitchen twisted around and tried to grab dishwasher and instead fell face first and on left side and boob, maybe broke rib. still no results on  heart scan. MRI and EEG on head to see if any recent stroke    Currently in Pain? Yes    Pain Score 7                                [facility] Adult PT Treatment/Exercise - 02/26/21 0001       Lumbar Exercises: Aerobic   Nustep L 5 7 min      Knee/Hip Exercises: Machines for Strengthening   Cybex Knee Extension 10# 2 sets 12    Cybex Knee Flexion 25# 2 sets 12      Knee/Hip Exercises: Standing   Other Standing Knee Exercises with RW marching , hip flex/ext and abd   PTA to stab  left knee as it was very unstable     Knee/Hip Exercises: Seated   Clamshell with TheraBand Green    Marching Strengthening;Both;20 reps   green tband   Sit to Sand 2 sets;with UE support;5 reps   1 hand CGA                      PT Short Term Goals - 01/06/21 1228       PT SHORT TERM GOAL #1   Title Patient to be independent with initial HEP.    Status Achieved               PT Long Term Goals - 02/26/21 1430       PT LONG TERM GOAL #2   Title Patient to demonstrate B LE strength >/=4+/5.    Status Partially Met      PT LONG TERM GOAL #5   Title Patient to report tolerance for 15 min of walking with LRAD without fatigue limiting.                   Plan - 02/26/21 1431     Clinical Impression Statement pt had a bad fall Saturday, hit left side ( knee, boob/ribs,shld and head). pt cancellled first appt this week but wanted to try today. modified session and limited UE use. STS needed UE and with standing ex left instabiity and buckled - PTA assisted to stab.    PT Treatment/Interventions ADLs/Self Care Home Management;Cryotherapy;Electrical Stimulation;Iontophoresis 87m/ml Dexamethasone;Moist Heat;Gait training;Stair training;Functional mobility training;Therapeutic activities;Therapeutic exercise;Balance training;Neuromuscular re-education;Patient/family education;Manual techniques;Passive range of motion;Dry needling;Taping;Vasopneumatic Device;Joint  Manipulations;Spinal Manipulations    PT Next Visit Plan progress strength and func- renewal next week as pt had another fall and alot of pending tests             Patient will benefit from skilled therapeutic intervention in order to improve the following deficits and impairments:  Decreased balance, Difficulty walking, Decreased activity tolerance, Decreased strength, Pain  Visit Diagnosis: Unsteadiness on feet  Difficulty in walking, not elsewhere classified  Muscle weakness (generalized)  Debility     Problem List Patient Active Problem List   Diagnosis Date Noted   Symptomatic bradycardia 01/16/2019   Normocytic anemia 01/16/2019   Diabetic peripheral neuropathy ([place] 01/12/2019   Neurologic abnormality 12/25/2018   Debility 09/21/2018   Hypotension 09/18/2018   Bradycardia 09/18/2018   AKI (acute kidney injury) ([facility] [place] 09/18/2018   Pressure injury of skin 09/18/2018   Ischemic cerebrovascular accident (CVA) of frontal lobe ([facility] 09/13/2018   Fall 09/09/2018   History of CVA (cerebrovascular accident) 09/09/2018   Type II diabetes mellitus with renal manifestations ([place] 09/09/2018   CKD (chronic kidney disease), stage III 09/09/2018   Sepsis ([facility] 09/09/2018   Depression with anxiety 09/09/2018   Slurred speech 09/09/2018   Ankle fracture, left 05/16/2018   Amiodarone pulmonary toxicity    Physical deconditioning    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[facility]    Acute on chronic congestive heart failure (HCC)    Chronic combined systolic (congestive) and diastolic (congestive) heart failure ([facility] 191/63/8466  Chronic systolic CHF (congestive heart failure) ([place] 01/16/2018   Atrial fibrillation ([place] 10/21/2017   CAD (coronary artery disease) of bypass graft 10/20/2017   Coronary artery disease of bypass graft of native heart with stable angina pectoris ([facility]    Chronic low back pain 08/24/2016   Pain of left thumb 08/05/2016   Nocturnal hypoxia 06/02/2016    Hoarseness  04/05/2016   Laryngopharyngeal reflux (LPR) 04/05/2016   Pharyngoesophageal dysphagia 04/05/2016   Angina decubitus ([place]) 05/22/2015   Chronic insomnia 05/06/2015   Common migraine 05/14/2014   Abnormality of gait 05/14/2014   Memory change 05/14/2014   Aneurysm, cerebral, nonruptured 05/14/2014   Cramp of limb-Left neck 05/10/2014   Hematemesis with nausea    Vomiting blood    Dizziness and giddiness 09/21/2013   Atypical chest pain 07/18/2013   Unstable angina ([place]) 04/09/2013   Carotid artery stenosis, symptomatic 03/20/2013   Cerebrovascular disease 07/10/2012   Cerebral artery occlusion with cerebral infarction ([place]) 07/10/2012   Mitral regurgitation 04/15/2012   TIA (transient ischemic attack) 04/15/2012   Occlusion and stenosis of carotid artery without mention of cerebral infarction 08/18/2011   Bariatric surgery status 06/17/2011   Speech abnormality 03/22/2011   Dyspnea 02/24/2011   PAPILLARY MUSCLE DYSFUNCTION, NON-RHEUMATIC 10/09/2008   UNSPECIFIED VITAMIN D DEFICIENCY 10/24/2007   MYOCARDIAL INFARCTION, HX OF 10/24/2007   PERSISTENT VOMITING 10/24/2007   OSTEOARTHRITIS 10/24/2007   MIGRAINES, HX OF 10/24/2007   Diabetes mellitus type 2, controlled, with complications ([place]) 09/32/6712   Hyperlipemia 01/16/2007   Obesity-post failed open gastroplasty 1984  01/16/2007   OBSTRUCTIVE SLEEP APNEA 01/16/2007   Essential hypertension 01/16/2007   Coronary atherosclerosis 01/16/2007   Asthma 01/16/2007   GERD 01/16/2007   VENTRAL HERNIA 01/16/2007    Yarelie Hams,ANGIE, PTA 02/26/2021, 2:34 PM  [address] [place], [place], [address] Phone: [phone]   Fax:  [phone]  Name: BRITTANI PURDUM [name]: [id number] Date of Birth: December 16, 1945

## 2021-02-26 NOTE — Patient Instructions (Signed)
Your Plan:  MRI brain  EEG ordered Manage stroke risk factors with PCP- BP, cholesterol and diabetes If your symptoms worsen or you develop new symptoms please let us know.    Thank you for coming to see [place] at [facility]. I hope we have been able to provide you high quality care today.  You may receive a patient satisfaction survey over the next few weeks. We would appreciate your feedback and comments so that we may continue to improve ourselves and the health of our patients.

## 2021-02-26 NOTE — Telephone Encounter (Signed)
Medicare/aarp order sent to GI, [facility] they will reach out to the patient to schedule.  °

## 2021-03-03 ENCOUNTER — Encounter: Payer: Self-pay | Admitting: Physical Therapy

## 2021-03-03 ENCOUNTER — Other Ambulatory Visit: Payer: Self-pay

## 2021-03-03 ENCOUNTER — Ambulatory Visit: Payer: Medicare Other | Admitting: Physical Therapy

## 2021-03-03 DIAGNOSIS — R262 Difficulty in walking, not elsewhere classified: Secondary | ICD-10-CM

## 2021-03-03 DIAGNOSIS — M6281 Muscle weakness (generalized): Secondary | ICD-10-CM | POA: Diagnosis not present

## 2021-03-03 DIAGNOSIS — R5381 Other malaise: Secondary | ICD-10-CM

## 2021-03-03 DIAGNOSIS — R2681 Unsteadiness on feet: Secondary | ICD-10-CM

## 2021-03-03 NOTE — Therapy (Signed)
[facility]. [place], [place], [address] Phone: [phone]   Fax:  [phone] Progress Note Reporting Period 01/01/21 to 03/03/21 for visits 21-30  See note below for Objective Data and Assessment of Progress/Goals.     Physical Therapy Treatment  Patient Details  Name: [name] [name] [name]: [id number] Date of Birth: 02/18/1945 Referring Provider (PT): Dr Loralie Champagne   Encounter Date: 03/03/2021   PT End of Session - 03/03/21 1224     Visit Number 30    Date for PT Re-Evaluation 03/06/21    PT Start Time 1141    PT Stop Time 1225    PT Time Calculation (min) 44 min    Activity Tolerance Patient tolerated treatment well    Behavior During Therapy [facility] for tasks assessed/performed             Past Medical History:  Diagnosis Date   Anemia    hx   Anxiety    Asthma    Basal cell carcinoma 05/2014   "left shoulder"   Bundle branch block, left    chronic/notes 07/18/2013   CHF (congestive heart failure) ([place])    Chronic insomnia 05/06/2015   Chronic kidney disease    frequency, sees dr Jamal Maes every 4 to 6 months (01/16/2018)   Chronic lower back pain    Claustrophobia    Common migraine 05/14/2014   Coronary artery disease    MI in 2001, 2002, 2006, 2011, 2014   Depression    Diabetic peripheral neuropathy ([place]) 01/12/2019   GERD (gastroesophageal reflux disease)    H/O hiatal hernia    Headache    "at least 2/month" (01/16/2018)   Heart murmur    Hyperlipidemia    Hypertension    Memory change 05/14/2014   Migraine    "5-6/year"  (01/16/2018)   Obesity 01-2010   Obstructive sleep apnea    "can't wear machine; I have claustrophobia" (01/16/2018), states she had a 2nd sleep study and does not have sleep apnea, her O2 decreases and now is on 2 L of O2 at night.   On home oxygen therapy    "2L at night and prn during daytime" (01/16/2018)   Osteoarthritis    "knees and hands"  (01/16/2018)   Peripheral vascular disease ([place])    ? numbness, tingling arms and legs   PONV (postoperative nausea and vomiting)    Stroke ([place])    2014, 2015, 2016   Type II diabetes mellitus ([place])    Ventral hernia    hx of    Past Surgical History:  Procedure Laterality Date   ANKLE FRACTURE SURGERY Left 1970's   APPENDECTOMY  1970's   w/hysterectomy   BASAL CELL CARCINOMA EXCISION Left 05/2014   "shoulder" (01/16/2018)   CARDIAC CATHETERIZATION  10/10/2012   Dr [name].   CARDIAC CATHETERIZATION N/A 05/29/2015   Procedure: Right/Left Heart Cath and Coronary/Graft Angiography;  Surgeon: Larey Dresser, MD;  Location: [facility];  Service: Cardiovascular;  Laterality: N/A;   CAROTID ENDARTERECTOMY Left 03/2013   CAROTID STENT INSERTION Left 03/20/2013   Procedure: CAROTID STENT INSERTION;  Surgeon: [name], MD;  Location: [facility] CATH LAB;  Service: Cardiovascular;  Laterality: Left;  internal carotid   CEREBRAL ANGIOGRAM N/A 04/05/2011   Procedure: CEREBRAL ANGIOGRAM;  Surgeon: Angelia Mould, MD;  Location: [facility] CATH LAB;  Service: Cardiovascular;  Laterality: N/A;   CHOLECYSTECTOMY OPEN  2004   CORONARY ANGIOPLASTY WITH  STENT PLACEMENT  01,02,05,06,07,08,11; 04/24/2013   "I've probably got ~ 10 stents by now" (04/24/2013)   CORONARY ANGIOPLASTY WITH STENT PLACEMENT  06/13/2013   "got 4 stents today" (06/13/2013)   CORONARY ARTERY BYPASS GRAFT  1220/11   "CABG X5"   CORONARY STENT INTERVENTION N/A 10/20/2017   Procedure: CORONARY STENT INTERVENTION;  Surgeon: Troy Sine, MD;  Location: [place] [facility];  Service: Cardiovascular;  Laterality: N/A;   ESOPHAGOGASTRODUODENOSCOPY  08/03/2011   Procedure: ESOPHAGOGASTRODUODENOSCOPY (EGD);  Surgeon: Shann Medal, MD;  Location: [facility];  Service: General;  Laterality: N/A;   ESOPHAGOGASTRODUODENOSCOPY (EGD) WITH PROPOFOL N/A 03/11/2014   Procedure: ESOPHAGOGASTRODUODENOSCOPY (EGD) WITH PROPOFOL;  Surgeon: Lafayette Dragon, MD;  Location: [facility];  Service: Endoscopy;  Laterality: N/A;   FRACTURE SURGERY     gall stone removal  05/2003   GASTRIC RESTRICTION SURGERY  1984   "stapeling"   HERNIA REPAIR  2004   "in my stomach; had OR on it twice", wire mesh on 1 hernia   LEFT HEART CATH AND CORS/GRAFTS ANGIOGRAPHY N/A 07/09/2016   Procedure: LEFT HEART CATH AND CORS/GRAFTS ANGIOGRAPHY;  Surgeon: Larey Dresser, MD;  Location: [facility] [facility];  Service: Cardiovascular;  Laterality: N/A;   ORIF ANKLE FRACTURE Left 05/16/2018   ORIF ANKLE FRACTURE Left 05/16/2018   Procedure: OPEN REDUCTION INTERNAL FIXATION (ORIF) Left ankle with possible syndesmosis fixation;  Surgeon: Nicholes Stairs, MD;  Location: [facility];  Service: Orthopedics;  Laterality: Left;  138mn   OVARY SURGERY  1970's   "tumor removed"   PERCUTANEOUS CORONARY STENT INTERVENTION (PCI-S) N/A 06/13/2013   Procedure: PERCUTANEOUS CORONARY STENT INTERVENTION (PCI-S);  Surgeon: JJettie Booze MD;  [facility];  Service: Cardiovascular;  Laterality: N/A;   PERCUTANEOUS STENT INTERVENTION N/A 04/24/2013   Procedure: PERCUTANEOUS STENT INTERVENTION;  Surgeon: JJettie Booze MD;  [facility];  Service: Cardiovascular;  Laterality: N/A;   RIGHT/LEFT HEART CATH AND CORONARY/GRAFT ANGIOGRAPHY N/A 10/20/2017   Procedure: RIGHT/LEFT HEART CATH AND CORONARY/GRAFT ANGIOGRAPHY;  Surgeon: MLarey Dresser MD;  [facility];  Service: Cardiovascular;  Laterality: N/A;   ROOT CANAL  10/2000   TEE WITHOUT CARDIOVERSION N/A 01/18/2019   Procedure: TRANSESOPHAGEAL ECHOCARDIOGRAM (TEE);  Surgeon: MLarey Dresser MD;  [facility];  Service: Cardiovascular;  Laterality: N/A;   TIBIA FRACTURE SURGERY Right 1970's   rods and pins   TOOTH EXTRACTION     "1 on the upper; wisdom tooth on the lower" (01/16/2018)   TOTAL ABDOMINAL HYSTERECTOMY  1970's   w/ appendectomy    There were no vitals filed for  this visit.   Subjective Assessment - 03/03/21 1148     Subjective Some pain from her fall but it has improved.    Currently in Pain? Yes    Pain Score 5     Pain Location Chest    Pain Orientation Left                               [facility] Adult PT Treatment/Exercise - 03/03/21 0001       Lumbar Exercises: Aerobic   UBE (Upper Arm Bike) L 3 6 min    Nustep L 5 7 min      Lumbar Exercises: Seated   Other Seated Lumbar Exercises Rows red 2x10      Knee/Hip Exercises: Standing   Other Standing Knee  Exercises with RW marching , hip flex/ext and abd      Knee/Hip Exercises: Seated   Long Arc Quad Strengthening;Both;2 sets;10 reps    Long Arc Quad Weight 5 lbs.    Hamstring Curl Strengthening;Both;2 sets;10 reps    Hamstring Limitations green    Sit to Sand without UE support;5 reps   4 sets                      PT Short Term Goals - 01/06/21 1228       PT SHORT TERM GOAL #1   Title Patient to be independent with initial HEP.    Status Achieved               PT Long Term Goals - 03/03/21 1225       PT LONG TERM GOAL #1   Title Patient to be independent with advanced HEP.      PT LONG TERM GOAL #2   Title Patient to demonstrate B LE strength >/=4+/5.    Status Partially Met      PT LONG TERM GOAL #3   Title Patient to score <14 sec on TUG testing in order to decrease risk of falls.    Status Partially Met      PT LONG TERM GOAL #4   Title Patient to score <13 sec on 5xSTS in order to decrease risk of falls.    Baseline from mat without UE 21.1 sec CGA without LOB at end of session    Status Partially Met      PT LONG TERM GOAL #5   Title Patient to report tolerance for 15 min of walking with LRAD without fatigue limiting.    Status Partially Met                   Plan - 03/03/21 1225     Clinical Impression Statement Pt enters clinic continuing to have pain from fall in L boob/rib area. Pt required PTA to  help stabilize L knee with standing hip activities to prevent L knee form buckling. L knee would continuously give way. Extra rest needed to complete sit to stands. No progress made due to recent fall and symptoms form fall.    Personal Factors and Comorbidities Age;Comorbidity 2    Comorbidities CHF, Pneumonia    Examination-Activity Limitations Locomotion Level;Stairs    Examination-Participation Restrictions Occupation;Community Activity    Stability/Clinical Decision Making Evolving/Moderate complexity    Rehab Potential Good    PT Frequency 1x / week    PT Duration 4 weeks    PT Treatment/Interventions ADLs/Self Care Home Management;Cryotherapy;Electrical Stimulation;Iontophoresis 68m/ml Dexamethasone;Moist Heat;Gait training;Stair training;Functional mobility training;Therapeutic activities;Therapeutic exercise;Balance training;Neuromuscular re-education;Patient/family education;Manual techniques;Passive range of motion;Dry needling;Taping;Vasopneumatic Device;Joint Manipulations;Spinal Manipulations    PT Next Visit Plan progress strength and func- renewal next week as pt had another fall and alot of pending tests             Patient will benefit from skilled therapeutic intervention in order to improve the following deficits and impairments:  Decreased balance, Difficulty walking, Decreased activity tolerance, Decreased strength, Pain  Visit Diagnosis: Muscle weakness (generalized)  Debility  Unsteadiness on feet  Difficulty in walking, not elsewhere classified     Problem List Patient Active Problem List   Diagnosis Date Noted   Symptomatic bradycardia 01/16/2019   Normocytic anemia 01/16/2019   Diabetic peripheral neuropathy ([place] 01/12/2019   Neurologic abnormality 12/25/2018   Debility 09/21/2018  Hypotension 09/18/2018   Bradycardia 09/18/2018   AKI (acute kidney injury) ([place]) 09/18/2018   Pressure injury of skin 09/18/2018   Ischemic cerebrovascular accident  (CVA) of frontal lobe ([place] ) 09/13/2018   Fall 09/09/2018   History of CVA (cerebrovascular accident) 09/09/2018   Type II diabetes mellitus with renal manifestations ([place]) 09/09/2018   CKD (chronic kidney disease), stage III 09/09/2018   Sepsis ([place]) 09/09/2018   Depression with anxiety 09/09/2018   Slurred speech 09/09/2018   Ankle fracture, left 05/16/2018   Amiodarone pulmonary toxicity    Physical deconditioning    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[place])    Acute on chronic congestive heart failure (HCC)    Chronic combined systolic (congestive) and diastolic (congestive) heart failure ([place]) 83/72/9021   Chronic systolic CHF (congestive heart failure) ([place]) 01/16/2018   Atrial fibrillation ([place]) 10/21/2017   CAD (coronary artery disease) of bypass graft 10/20/2017   Coronary artery disease of bypass graft of native heart with stable angina pectoris (HCC)    Chronic low back pain 08/24/2016   Pain of left thumb 08/05/2016   Nocturnal hypoxia 06/02/2016   Hoarseness 04/05/2016   Laryngopharyngeal reflux (LPR) 04/05/2016   Pharyngoesophageal dysphagia 04/05/2016   Angina decubitus ([place]) 05/22/2015   Chronic insomnia 05/06/2015   Common migraine 05/14/2014   Abnormality of gait 05/14/2014   Memory change 05/14/2014   Aneurysm, cerebral, nonruptured 05/14/2014   Cramp of limb-Left neck 05/10/2014   Hematemesis with nausea    Vomiting blood    Dizziness and giddiness 09/21/2013   Atypical chest pain 07/18/2013   Unstable angina (HCC) 04/09/2013   Carotid artery stenosis, symptomatic 03/20/2013   Cerebrovascular disease 07/10/2012   Cerebral artery occlusion with cerebral infarction ([place]) 07/10/2012   Mitral regurgitation 04/15/2012   TIA (transient ischemic attack) 04/15/2012   Occlusion and stenosis of carotid artery without mention of cerebral infarction 08/18/2011   Bariatric surgery status 06/17/2011   Speech abnormality 03/22/2011   Dyspnea 02/24/2011   PAPILLARY MUSCLE  DYSFUNCTION, NON-RHEUMATIC 10/09/2008   UNSPECIFIED VITAMIN D DEFICIENCY 10/24/2007   MYOCARDIAL INFARCTION, HX OF 10/24/2007   PERSISTENT VOMITING 10/24/2007   OSTEOARTHRITIS 10/24/2007   MIGRAINES, HX OF 10/24/2007   Diabetes mellitus type 2, controlled, with complications ([place]) 11/55/2080   Hyperlipemia 01/16/2007   Obesity-post failed open gastroplasty 1984  01/16/2007   OBSTRUCTIVE SLEEP APNEA 01/16/2007   Essential hypertension 01/16/2007   Coronary atherosclerosis 01/16/2007   Asthma 01/16/2007   GERD 01/16/2007   VENTRAL HERNIA 01/16/2007    Scot Jun, PTA 03/03/2021, 12:32 PM  [address] [place], [place], [address] Phone: [phone]   Fax:  [phone]  Name: [name] [name] [name]: [id number] Date of Birth: 01/19/1946

## 2021-03-04 NOTE — Progress Notes (Signed)
I agree with the above plan 

## 2021-03-05 ENCOUNTER — Ambulatory Visit: Payer: Medicare Other | Attending: Cardiology | Admitting: Physical Therapy

## 2021-03-05 ENCOUNTER — Other Ambulatory Visit: Payer: Self-pay

## 2021-03-05 DIAGNOSIS — R2681 Unsteadiness on feet: Secondary | ICD-10-CM | POA: Diagnosis present

## 2021-03-05 DIAGNOSIS — R5381 Other malaise: Secondary | ICD-10-CM | POA: Diagnosis present

## 2021-03-05 DIAGNOSIS — M6281 Muscle weakness (generalized): Secondary | ICD-10-CM | POA: Insufficient documentation

## 2021-03-05 DIAGNOSIS — R262 Difficulty in walking, not elsewhere classified: Secondary | ICD-10-CM | POA: Diagnosis present

## 2021-03-05 NOTE — Therapy (Signed)
[facility]. [name], [place], [address] Phone: [phone]   Fax:  [phone]  Physical Therapy Treatment  Patient Details  Name: [name] [name] [name]: [id number] Date of Birth: Oct 26, 1945 Referring Provider (PT): Dr Loralie Champagne   Encounter Date: 03/05/2021   PT End of Session - 03/05/21 1214     Visit Number 31    Date for PT Re-Evaluation 03/06/21    PT Start Time 1136    PT Stop Time 1225    PT Time Calculation (min) 49 min             Past Medical History:  Diagnosis Date   Anemia    hx   Anxiety    Asthma    Basal cell carcinoma 05/2014   "left shoulder"   Bundle branch block, left    chronic/notes 07/18/2013   CHF (congestive heart failure) ([place])    Chronic insomnia 05/06/2015   Chronic kidney disease    frequency, sees dr Jamal Maes every 4 to 6 months (01/16/2018)   Chronic lower back pain    Claustrophobia    Common migraine 05/14/2014   Coronary artery disease    MI in 2001, 2002, 2006, 2011, 2014   Depression    Diabetic peripheral neuropathy ([place]) 01/12/2019   GERD (gastroesophageal reflux disease)    H/O hiatal hernia    Headache    "at least 2/month" (01/16/2018)   Heart murmur    Hyperlipidemia    Hypertension    Memory change 05/14/2014   Migraine    "5-6/year"  (01/16/2018)   Obesity 01-2010   Obstructive sleep apnea    "can't wear machine; I have claustrophobia" (01/16/2018), states she had a 2nd sleep study and does not have sleep apnea, her O2 decreases and now is on 2 L of O2 at night.   On home oxygen therapy    "2L at night and prn during daytime" (01/16/2018)   Osteoarthritis    "knees and hands" (01/16/2018)   Peripheral vascular disease ([place])    ? numbness, tingling arms and legs   PONV (postoperative nausea and vomiting)    Stroke ([place])    2014, 2015, 2016   Type II diabetes mellitus ([place])    Ventral hernia    hx of    Past Surgical History:   Procedure Laterality Date   ANKLE FRACTURE SURGERY Left 1970's   APPENDECTOMY  1970's   w/hysterectomy   BASAL CELL CARCINOMA EXCISION Left 05/2014   "shoulder" (01/16/2018)   CARDIAC CATHETERIZATION  10/10/2012   Dr [name].   CARDIAC CATHETERIZATION N/A 05/29/2015   Procedure: Right/Left Heart Cath and Coronary/Graft Angiography;  Surgeon: Larey Dresser, MD;  Location: [place] [facility];  Service: Cardiovascular;  Laterality: N/A;   CAROTID ENDARTERECTOMY Left 03/2013   CAROTID STENT INSERTION Left 03/20/2013   Procedure: CAROTID STENT INSERTION;  Surgeon: [name], MD;  Location: [facility] CATH LAB;  Service: Cardiovascular;  Laterality: Left;  internal carotid   CEREBRAL ANGIOGRAM N/A 04/05/2011   Procedure: CEREBRAL ANGIOGRAM;  Surgeon: Angelia Mould, MD;  Location: [facility] CATH LAB;  Service: Cardiovascular;  Laterality: N/A;   CHOLECYSTECTOMY OPEN  2004   CORONARY ANGIOPLASTY WITH STENT PLACEMENT  01,02,05,06,07,08,11; 04/24/2013   "I've probably got ~ 10 stents by now" (04/24/2013)   CORONARY ANGIOPLASTY WITH STENT PLACEMENT  06/13/2013   "got 4 stents today" (06/13/2013)   CORONARY ARTERY BYPASS GRAFT  1220/11   "CABG  X5"   CORONARY STENT INTERVENTION N/A 10/20/2017   Procedure: CORONARY STENT INTERVENTION;  Surgeon: Troy Sine, MD;  Location: [place] CV LAB;  Service: Cardiovascular;  Laterality: N/A;   ESOPHAGOGASTRODUODENOSCOPY  08/03/2011   Procedure: ESOPHAGOGASTRODUODENOSCOPY (EGD);  Surgeon: Shann Medal, MD;  [facility];  Service: General;  Laterality: N/A;   ESOPHAGOGASTRODUODENOSCOPY (EGD) WITH PROPOFOL N/A 03/11/2014   Procedure: ESOPHAGOGASTRODUODENOSCOPY (EGD) WITH PROPOFOL;  Surgeon: Lafayette Dragon, MD;  [facility]: [facility];  Service: Endoscopy;  Laterality: N/A;   FRACTURE SURGERY     gall stone removal  05/2003   GASTRIC RESTRICTION SURGERY  1984   "stapeling"   HERNIA REPAIR  2004   "in my stomach; had OR on it twice", wire mesh on 1 hernia    LEFT HEART CATH AND CORS/GRAFTS ANGIOGRAPHY N/A 07/09/2016   Procedure: LEFT HEART CATH AND CORS/GRAFTS ANGIOGRAPHY;  Surgeon: Larey Dresser, MD;  Location: [place] CV LAB;  Service: Cardiovascular;  Laterality: N/A;   ORIF ANKLE FRACTURE Left 05/16/2018   ORIF ANKLE FRACTURE Left 05/16/2018   Procedure: OPEN REDUCTION INTERNAL FIXATION (ORIF) Left ankle with possible syndesmosis fixation;  Surgeon: Nicholes Stairs, MD;  Location: [place];  Service: Orthopedics;  Laterality: Left;  169mn   OVARY SURGERY  1970's   "tumor removed"   PERCUTANEOUS CORONARY STENT INTERVENTION (PCI-S) N/A 06/13/2013   Procedure: PERCUTANEOUS CORONARY STENT INTERVENTION (PCI-S);  Surgeon: JJettie Booze MD;  [facility];  Service: Cardiovascular;  Laterality: N/A;   PERCUTANEOUS STENT INTERVENTION N/A 04/24/2013   Procedure: PERCUTANEOUS STENT INTERVENTION;  Surgeon: JJettie Booze MD;  [facility];  Service: Cardiovascular;  Laterality: N/A;   RIGHT/LEFT HEART CATH AND CORONARY/GRAFT ANGIOGRAPHY N/A 10/20/2017   Procedure: RIGHT/LEFT HEART CATH AND CORONARY/GRAFT ANGIOGRAPHY;  Surgeon: MLarey Dresser MD;  [facility];  Service: Cardiovascular;  Laterality: N/A;   ROOT CANAL  10/2000   TEE WITHOUT CARDIOVERSION N/A 01/18/2019   Procedure: TRANSESOPHAGEAL ECHOCARDIOGRAM (TEE);  Surgeon: MLarey Dresser MD;  [facility];  Service: Cardiovascular;  Laterality: N/A;   TIBIA FRACTURE SURGERY Right 1970's   rods and pins   TOOTH EXTRACTION     "1 on the upper; wisdom tooth on the lower" (01/16/2018)   TOTAL ABDOMINAL HYSTERECTOMY  1970's   w/ appendectomy    There were no vitals filed for this visit.   Subjective Assessment - 03/05/21 1138     Subjective still sore from fall, left knee definately unstable but no falls    Currently in Pain? Yes    Pain Score 4     Pain Location Chest                               [facility]Ex[facility] - 03/05/21 0001       Ambulation/Gait   Gait Comments 4 laps with RW about 400 feet with increased LBP 7/10 and PRE 8/10 with chest pain form fall      Lumbar Exercises: Aerobic   UBE (Upper Arm Bike) L 3 6 min ( 3 fwd/3 back)    Nustep L 5 7 min      Lumbar Exercises: Standing   Other Standing Lumbar Exercises 4 # shld chest press, shld flex,abd and bicep curl 10 x each      Lumbar Exercises: Seated   Sit to Stand 5 reps  2 sets     Knee/Hip Exercises: Machines for Strengthening   Cybex Knee Extension 10# 3 sets 10    Cybex Knee Flexion 25# 3 sets 10                       PT Short Term Goals - 01/06/21 1228       PT SHORT TERM GOAL #1   Title Patient to be independent with initial HEP.    Status Achieved               PT Long Term Goals - 03/05/21 1151       PT LONG TERM GOAL #1   Title Patient to be independent with advanced HEP.    Status Achieved      PT LONG TERM GOAL #2   Title Patient to demonstrate B LE strength >/=4+/5.    Baseline grossly 4/5 .pt also fatigues quickly    Status Partially Met      PT LONG TERM GOAL #3   Title Patient to score <14 sec on TUG testing in order to decrease risk of falls.    Baseline with RW 15 sec  ( did at start of session agter 13 min cardio)    Status Partially Met      PT LONG TERM GOAL #4   Title Patient to score <13 sec on 5xSTS in order to decrease risk of falls.    Baseline from mat without UE 5 x no LOB 23 sec.  nd time cued to increase speed 14 sec without LOB    Status Partially Met      PT LONG TERM GOAL #5   Title Patient to report tolerance for 15 min of walking with LRAD without fatigue limiting.    Baseline 6-8 min per pt report, back pain and SOB    Status Partially Met                   Plan - 03/05/21 1207     Clinical Impression Statement amb with RW 400 laps about 400 feet in 3 min 5 sec with increased LBP 7/10 and PRE 8/10. slow steady goal  progress. SOB and fatigue are an issue requiring short recovery breaks.    PT Treatment/Interventions ADLs/Self Care Home Management;Cryotherapy;Electrical Stimulation;Iontophoresis 47m/ml Dexamethasone;Moist Heat;Gait training;Stair training;Functional mobility training;Therapeutic activities;Therapeutic exercise;Balance training;Neuromuscular re-education;Patient/family education;Manual techniques;Passive range of motion;Dry needling;Taping;Vasopneumatic Device;Joint Manipulations;Spinal Manipulations    PT Next Visit Plan renewal next session. MD f/u 2/28 re; test results             Patient will benefit from skilled therapeutic intervention in order to improve the following deficits and impairments:  Decreased balance, Difficulty walking, Decreased activity tolerance, Decreased strength, Pain  Visit Diagnosis: Muscle weakness (generalized)  Debility  Unsteadiness on feet  Difficulty in walking, not elsewhere classified     Problem List Patient Active Problem List   Diagnosis Date Noted   Symptomatic bradycardia 01/16/2019   Normocytic anemia 01/16/2019   Diabetic peripheral neuropathy ([place] 01/12/2019   Neurologic abnormality 12/25/2018   Debility 09/21/2018   Hypotension 09/18/2018   Bradycardia 09/18/2018   AKI (acute kidney injury) ([place] 09/18/2018   Pressure injury of skin 09/18/2018   Ischemic cerebrovascular accident (CVA) of frontal lobe ([place] 09/13/2018   Fall 09/09/2018   History of CVA (cerebrovascular accident) 09/09/2018   Type II diabetes mellitus with renal manifestations ([place] 09/09/2018   CKD (chronic kidney disease), stage III 09/09/2018  Sepsis ([place]) 09/09/2018   Depression with anxiety 09/09/2018   Slurred speech 09/09/2018   Ankle fracture, left 05/16/2018   Amiodarone pulmonary toxicity    Physical deconditioning    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 [facility])    Acute on chronic congestive heart failure (HCC)    Chronic combined systolic (congestive)  and diastolic (congestive) heart failure ([place]) 25/06/3974   Chronic systolic CHF (congestive heart failure) ([place]) 01/16/2018   Atrial fibrillation ([place]) 10/21/2017   CAD (coronary artery disease) of bypass graft 10/20/2017   Coronary artery disease of bypass graft of native heart with stable angina pectoris (HCC)    Chronic low back pain 08/24/2016   Pain of left thumb 08/05/2016   Nocturnal hypoxia 06/02/2016   Hoarseness 04/05/2016   Laryngopharyngeal reflux (LPR) 04/05/2016   Pharyngoesophageal dysphagia 04/05/2016   Angina decubitus ([place]) 05/22/2015   Chronic insomnia 05/06/2015   Common migraine 05/14/2014   Abnormality of gait 05/14/2014   Memory change 05/14/2014   Aneurysm, cerebral, nonruptured 05/14/2014   Cramp of limb-Left neck 05/10/2014   Hematemesis with nausea    Vomiting blood    Dizziness and giddiness 09/21/2013   Atypical chest pain 07/18/2013   Unstable angina ([place]) 04/09/2013   Carotid artery stenosis, symptomatic 03/20/2013   Cerebrovascular disease 07/10/2012   Cerebral artery occlusion with cerebral infarction ([place]) 07/10/2012   Mitral regurgitation 04/15/2012   TIA (transient ischemic attack) 04/15/2012   Occlusion and stenosis of carotid artery without mention of cerebral infarction 08/18/2011   Bariatric surgery status 06/17/2011   Speech abnormality 03/22/2011   Dyspnea 02/24/2011   PAPILLARY MUSCLE DYSFUNCTION, NON-RHEUMATIC 10/09/2008   UNSPECIFIED VITAMIN D DEFICIENCY 10/24/2007   MYOCARDIAL INFARCTION, HX OF 10/24/2007   PERSISTENT VOMITING 10/24/2007   OSTEOARTHRITIS 10/24/2007   MIGRAINES, HX OF 10/24/2007   Diabetes mellitus type 2, controlled, with complications ([place]) 73/41/9379   Hyperlipemia 01/16/2007   Obesity-post failed open gastroplasty 1984  01/16/2007   OBSTRUCTIVE SLEEP APNEA 01/16/2007   Essential hypertension 01/16/2007   Coronary atherosclerosis 01/16/2007   Asthma 01/16/2007   GERD 01/16/2007   VENTRAL HERNIA 01/16/2007     Alyiah Ulloa,ANGIE, PTA 03/05/2021, 12:17 PM  [place]. [place], [place], [address] Phone: [phone]   Fax:  [phone]  Name: MECCA GUITRON [name]: [id number] Date of Birth: 05/27/1945

## 2021-03-10 ENCOUNTER — Encounter: Payer: Self-pay | Admitting: Physical Therapy

## 2021-03-10 ENCOUNTER — Ambulatory Visit: Payer: Medicare Other | Admitting: Physical Therapy

## 2021-03-10 ENCOUNTER — Other Ambulatory Visit: Payer: Self-pay

## 2021-03-10 DIAGNOSIS — M6281 Muscle weakness (generalized): Secondary | ICD-10-CM

## 2021-03-10 DIAGNOSIS — R2681 Unsteadiness on feet: Secondary | ICD-10-CM

## 2021-03-10 DIAGNOSIS — R5381 Other malaise: Secondary | ICD-10-CM

## 2021-03-10 DIAGNOSIS — R262 Difficulty in walking, not elsewhere classified: Secondary | ICD-10-CM

## 2021-03-10 NOTE — Therapy (Signed)
[facility] [place]. [place], [place], [address] Phone: [phone]   Fax:  [phone]  Physical Therapy Treatment  Patient Details  Name: [name] [name] [name]: [id number] Date of Birth: 1945-10-07 Referring Provider (PT): Dr Loralie Champagne   Encounter Date: 03/10/2021   PT End of Session - 03/10/21 1631     Visit Number 32    Date for PT Re-Evaluation 03/06/21    PT Start Time 1550    PT Stop Time 1632    PT Time Calculation (min) 42 min    Activity Tolerance Patient tolerated treatment well    Behavior During Therapy [facility] for tasks assessed/performed             Past Medical History:  Diagnosis Date   Anemia    hx   Anxiety    Asthma    Basal cell carcinoma 05/2014   "left shoulder"   Bundle branch block, left    chronic/notes 07/18/2013   CHF (congestive heart failure) ([place])    Chronic insomnia 05/06/2015   Chronic kidney disease    frequency, sees dr Jamal Maes every 4 to 6 months (01/16/2018)   Chronic lower back pain    Claustrophobia    Common migraine 05/14/2014   Coronary artery disease    MI in 2001, 2002, 2006, 2011, 2014   Depression    Diabetic peripheral neuropathy ([place]) 01/12/2019   GERD (gastroesophageal reflux disease)    H/O hiatal hernia    Headache    "at least 2/month" (01/16/2018)   Heart murmur    Hyperlipidemia    Hypertension    Memory change 05/14/2014   Migraine    "5-6/year"  (01/16/2018)   Obesity 01-2010   Obstructive sleep apnea    "can't wear machine; I have claustrophobia" (01/16/2018), states she had a 2nd sleep study and does not have sleep apnea, her O2 decreases and now is on 2 L of O2 at night.   On home oxygen therapy    "2L at night and prn during daytime" (01/16/2018)   Osteoarthritis    "knees and hands" (01/16/2018)   Peripheral vascular disease ([place])    ? numbness, tingling arms and legs   PONV (postoperative nausea and vomiting)    Stroke ([place])     2014, 2015, 2016   Type II diabetes mellitus ([place])    Ventral hernia    hx of    Past Surgical History:  Procedure Laterality Date   ANKLE FRACTURE SURGERY Left 1970's   APPENDECTOMY  1970's   w/hysterectomy   BASAL CELL CARCINOMA EXCISION Left 05/2014   "shoulder" (01/16/2018)   CARDIAC CATHETERIZATION  10/10/2012   Dr [name].   CARDIAC CATHETERIZATION N/A 05/29/2015   Procedure: Right/Left Heart Cath and Coronary/Graft Angiography;  Surgeon: Larey Dresser, MD;  Location: [place] CV LAB;  Service: Cardiovascular;  Laterality: N/A;   CAROTID ENDARTERECTOMY Left 03/2013   CAROTID STENT INSERTION Left 03/20/2013   Procedure: CAROTID STENT INSERTION;  Surgeon: [name], MD;  [facility] CATH LAB;  Service: Cardiovascular;  Laterality: Left;  internal carotid   CEREBRAL ANGIOGRAM N/A 04/05/2011   Procedure: CEREBRAL ANGIOGRAM;  Surgeon: Angelia Mould, MD;  Location: [facility] CATH LAB;  Service: Cardiovascular;  Laterality: N/A;   CHOLECYSTECTOMY OPEN  2004   CORONARY ANGIOPLASTY WITH STENT PLACEMENT  01,02,05,06,07,08,11; 04/24/2013   "I've probably got ~ 10 stents by now" (04/24/2013)   CORONARY ANGIOPLASTY WITH STENT PLACEMENT  06/13/2013   "got 4 stents today" (06/13/2013)   CORONARY ARTERY BYPASS GRAFT  1220/11   "CABG X5"   CORONARY STENT INTERVENTION N/A 10/20/2017   Procedure: CORONARY STENT INTERVENTION;  Surgeon: Troy Sine, MD;  Location: [place] CV LAB;  Service: Cardiovascular;  Laterality: N/A;   ESOPHAGOGASTRODUODENOSCOPY  08/03/2011   Procedure: ESOPHAGOGASTRODUODENOSCOPY (EGD);  Surgeon: Shann Medal, MD;  [facility];  Service: General;  Laterality: N/A;   ESOPHAGOGASTRODUODENOSCOPY (EGD) WITH PROPOFOL N/A 03/11/2014   Procedure: ESOPHAGOGASTRODUODENOSCOPY (EGD) WITH PROPOFOL;  Surgeon: Lafayette Dragon, MD;  [facility];  Service: Endoscopy;  Laterality: N/A;   FRACTURE SURGERY     gall stone removal  05/2003   GASTRIC RESTRICTION  SURGERY  1984   "stapeling"   HERNIA REPAIR  2004   "in my stomach; had OR on it twice", wire mesh on 1 hernia   LEFT HEART CATH AND CORS/GRAFTS ANGIOGRAPHY N/A 07/09/2016   Procedure: LEFT HEART CATH AND CORS/GRAFTS ANGIOGRAPHY;  Surgeon: Larey Dresser, MD;  Location: [place] [facility] LAB;  Service: Cardiovascular;  Laterality: N/A;   ORIF ANKLE FRACTURE Left 05/16/2018   ORIF ANKLE FRACTURE Left 05/16/2018   Procedure: OPEN REDUCTION INTERNAL FIXATION (ORIF) Left ankle with possible syndesmosis fixation;  Surgeon: Nicholes Stairs, MD;  Location: [place];  Service: Orthopedics;  Laterality: Left;  125mn   OVARY SURGERY  1970's   "tumor removed"   PERCUTANEOUS CORONARY STENT INTERVENTION (PCI-S) N/A 06/13/2013   Procedure: PERCUTANEOUS CORONARY STENT INTERVENTION (PCI-S);  Surgeon: JJettie Booze MD;  [facility];  Service: Cardiovascular;  Laterality: N/A;   PERCUTANEOUS STENT INTERVENTION N/A 04/24/2013   Procedure: PERCUTANEOUS STENT INTERVENTION;  Surgeon: JJettie Booze MD;  [facility];  Service: Cardiovascular;  Laterality: N/A;   RIGHT/LEFT HEART CATH AND CORONARY/GRAFT ANGIOGRAPHY N/A 10/20/2017   Procedure: RIGHT/LEFT HEART CATH AND CORONARY/GRAFT ANGIOGRAPHY;  Surgeon: MLarey Dresser MD;  [facility];  Service: Cardiovascular;  Laterality: N/A;   ROOT CANAL  10/2000   TEE WITHOUT CARDIOVERSION N/A 01/18/2019   Procedure: TRANSESOPHAGEAL ECHOCARDIOGRAM (TEE);  Surgeon: MLarey Dresser MD;  [facility];  Service: Cardiovascular;  Laterality: N/A;   TIBIA FRACTURE SURGERY Right 1970's   rods and pins   TOOTH EXTRACTION     "1 on the upper; wisdom tooth on the lower" (01/16/2018)   TOTAL ABDOMINAL HYSTERECTOMY  1970's   w/ appendectomy    There were no vitals filed for this visit.   Subjective Assessment - 03/10/21 1548     Subjective "I am still hurting from my fall, and my L leg gas been dragging" Have a EEG  tomorrow    Currently in Pain? Yes   Chest  3/10 back 6/10   Pain Score 4     Pain Location Arm    Pain Orientation Left                [facility] PT Assessment - 03/10/21 0001       Strength   Right Hip Flexion 4+/5    Right Hip ADduction 4+/5    Left Hip Flexion 4/5    Left Hip ADduction 4+/5    Left Knee Extension 4+/5    Right Ankle Dorsiflexion 5/5    Right Ankle Plantar Flexion 5/5    Left Ankle Dorsiflexion 4/5    Left Ankle Plantar Flexion 4+/5      Transfers   Five time sit  to stand comments  14.60 from mat table      Timed Up and Go Test   Normal TUG (seconds) 16.14   Average of three with 4WW                          [facility] Adult PT Treatment/Exercise - 03/10/21 0001       Lumbar Exercises: Aerobic   Nustep L 5 8 min      Lumbar Exercises: Standing   Shoulder Extension Strengthening;Both;Theraband;20 reps    Theraband Level (Shoulder Extension) Level 3 (Green)      Lumbar Exercises: Seated   Sit to Stand 10 reps    Other Seated Lumbar Exercises Rows green 2x15      Knee/Hip Exercises: Machines for Strengthening   Cybex Knee Extension 10# 2 sets 12    Cybex Knee Flexion 25# 2 sets 12                       PT Short Term Goals - 01/06/21 1228       PT SHORT TERM GOAL #1   Title Patient to be independent with initial HEP.    Status Achieved               PT Long Term Goals - 03/10/21 1609       PT LONG TERM GOAL #3   Title Patient to score <14 sec on TUG testing in order to decrease risk of falls.    Status Partially Met   16.41 average of three trials 4WW                  Plan - 03/10/21 1634     Clinical Impression Statement Pt has made a slight progression towards  goals. She has increased her strength in both LE's. She stated that her recent fall has caused a setback with her functional mobility. Her L arm remains busied from the fall. L knee did give away during the first trial of TUG. Some  fatigue with five time sit to stands. Pt would benefit form PT to improve her functional mobility.    Personal Factors and Comorbidities Age;Comorbidity 2    Comorbidities CHF, Pneumonia    Examination-Activity Limitations Locomotion Level;Stairs    Examination-Participation Restrictions Occupation;Community Activity    Stability/Clinical Decision Making Evolving/Moderate complexity    Rehab Potential Good    PT Frequency 1x / week    PT Treatment/Interventions ADLs/Self Care Home Management;Cryotherapy;Electrical Stimulation;Iontophoresis 4m/ml Dexamethasone;Moist Heat;Gait training;Stair training;Functional mobility training;Therapeutic activities;Therapeutic exercise;Balance training;Neuromuscular re-education;Patient/family education;Manual techniques;Passive range of motion;Dry needling;Taping;Vasopneumatic Device;Joint Manipulations;Spinal Manipulations             Patient will benefit from skilled therapeutic intervention in order to improve the following deficits and impairments:  Decreased balance, Difficulty walking, Decreased activity tolerance, Decreased strength, Pain  Visit Diagnosis: Muscle weakness (generalized)  Debility  Unsteadiness on feet  Difficulty in walking, not elsewhere classified     Problem List Patient Active Problem List   Diagnosis Date Noted   Symptomatic bradycardia 01/16/2019   Normocytic anemia 01/16/2019   Diabetic peripheral neuropathy ([place] 01/12/2019   Neurologic abnormality 12/25/2018   Debility 09/21/2018   Hypotension 09/18/2018   Bradycardia 09/18/2018   AKI (acute kidney injury) ([place] 09/18/2018   Pressure injury of skin 09/18/2018   Ischemic cerebrovascular accident (CVA) of frontal lobe ([place] 09/13/2018   Fall 09/09/2018   History of CVA (cerebrovascular accident) 09/09/2018  Type II diabetes mellitus with renal manifestations ([place]) 09/09/2018   CKD (chronic kidney disease), stage III 09/09/2018   Sepsis ([place]) 09/09/2018    Depression with anxiety 09/09/2018   Slurred speech 09/09/2018   Ankle fracture, left 05/16/2018   Amiodarone pulmonary toxicity    Physical deconditioning    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[place])    Acute on chronic congestive heart failure (HCC)    Chronic combined systolic (congestive) and diastolic (congestive) heart failure ([place]) 81/59/4707   Chronic systolic CHF (congestive heart failure) ([place]) 01/16/2018   Atrial fibrillation ([place]) 10/21/2017   CAD (coronary artery disease) of bypass graft 10/20/2017   Coronary artery disease of bypass graft of native heart with stable angina pectoris (HCC)    Chronic low back pain 08/24/2016   Pain of left thumb 08/05/2016   Nocturnal hypoxia 06/02/2016   Hoarseness 04/05/2016   Laryngopharyngeal reflux (LPR) 04/05/2016   Pharyngoesophageal dysphagia 04/05/2016   Angina decubitus ([place]) 05/22/2015   Chronic insomnia 05/06/2015   Common migraine 05/14/2014   Abnormality of gait 05/14/2014   Memory change 05/14/2014   Aneurysm, cerebral, nonruptured 05/14/2014   Cramp of limb-Left neck 05/10/2014   Hematemesis with nausea    Vomiting blood    Dizziness and giddiness 09/21/2013   Atypical chest pain 07/18/2013   Unstable angina ([place]) 04/09/2013   Carotid artery stenosis, symptomatic 03/20/2013   Cerebrovascular disease 07/10/2012   Cerebral artery occlusion with cerebral infarction ([place]) 07/10/2012   Mitral regurgitation 04/15/2012   TIA (transient ischemic attack) 04/15/2012   Occlusion and stenosis of carotid artery without mention of cerebral infarction 08/18/2011   Bariatric surgery status 06/17/2011   Speech abnormality 03/22/2011   Dyspnea 02/24/2011   PAPILLARY MUSCLE DYSFUNCTION, NON-RHEUMATIC 10/09/2008   UNSPECIFIED VITAMIN D DEFICIENCY 10/24/2007   MYOCARDIAL INFARCTION, HX OF 10/24/2007   PERSISTENT VOMITING 10/24/2007   OSTEOARTHRITIS 10/24/2007   MIGRAINES, HX OF 10/24/2007   Diabetes mellitus type 2, controlled, with  complications ([place]) 61/51/8343   Hyperlipemia 01/16/2007   Obesity-post failed open gastroplasty 1984  01/16/2007   OBSTRUCTIVE SLEEP APNEA 01/16/2007   Essential hypertension 01/16/2007   Coronary atherosclerosis 01/16/2007   Asthma 01/16/2007   GERD 01/16/2007   VENTRAL HERNIA 01/16/2007    Scot Jun, PTA 03/10/2021, 4:48 PM  [place]. [place], [place], [address] Phone: [phone]   Fax:  [phone]  Name: [name] [name] [name]: [id number] Date of Birth: Dec 16, 1945

## 2021-03-11 ENCOUNTER — Ambulatory Visit (INDEPENDENT_AMBULATORY_CARE_PROVIDER_SITE_OTHER): Payer: Medicare Other | Admitting: Neurology

## 2021-03-11 DIAGNOSIS — R2990 Unspecified symptoms and signs involving the nervous system: Secondary | ICD-10-CM

## 2021-03-11 DIAGNOSIS — R55 Syncope and collapse: Secondary | ICD-10-CM | POA: Diagnosis not present

## 2021-03-12 ENCOUNTER — Other Ambulatory Visit: Payer: Self-pay

## 2021-03-12 ENCOUNTER — Ambulatory Visit
Admission: RE | Admit: 2021-03-12 | Discharge: 2021-03-12 | Disposition: A | Discharge status: Home / Self Care | Payer: Medicare Other | Source: Ambulatory Visit | Attending: Adult Health | Admitting: Adult Health

## 2021-03-12 ENCOUNTER — Ambulatory Visit: Payer: Medicare Other | Admitting: Physical Therapy

## 2021-03-12 DIAGNOSIS — M6281 Muscle weakness (generalized): Secondary | ICD-10-CM | POA: Diagnosis not present

## 2021-03-12 DIAGNOSIS — R2990 Unspecified symptoms and signs involving the nervous system: Secondary | ICD-10-CM | POA: Diagnosis not present

## 2021-03-12 DIAGNOSIS — R5381 Other malaise: Secondary | ICD-10-CM

## 2021-03-12 NOTE — Therapy (Signed)
[place] [place], [place], [address] Phone: [phone]   Fax:  [phone]  Physical Therapy Treatment  Patient Details  Name: [name] [name] [name]: [id number] Date of Birth: 04-14-1945 Referring Provider (PT): Dr Loralie Champagne   Encounter Date: 03/12/2021   PT End of Session - 03/12/21 1506     Visit Number 54    Date for PT Re-Evaluation 04/07/21    PT Start Time 1430    PT Stop Time 1520    PT Time Calculation (min) 50 min             Past Medical History:  Diagnosis Date   Anemia    hx   Anxiety    Asthma    Basal cell carcinoma 05/2014   "left shoulder"   Bundle branch block, left    chronic/notes 07/18/2013   CHF (congestive heart failure) ([place])    Chronic insomnia 05/06/2015   Chronic kidney disease    frequency, sees dr Jamal Maes every 4 to 6 months (01/16/2018)   Chronic lower back pain    Claustrophobia    Common migraine 05/14/2014   Coronary artery disease    MI in 2001, 2002, 2006, 2011, 2014   Depression    Diabetic peripheral neuropathy ([place]) 01/12/2019   GERD (gastroesophageal reflux disease)    H/O hiatal hernia    Headache    "at least 2/month" (01/16/2018)   Heart murmur    Hyperlipidemia    Hypertension    Memory change 05/14/2014   Migraine    "5-6/year"  (01/16/2018)   Obesity 01-2010   Obstructive sleep apnea    "can't wear machine; I have claustrophobia" (01/16/2018), states she had a 2nd sleep study and does not have sleep apnea, her O2 decreases and now is on 2 L of O2 at night.   On home oxygen therapy    "2L at night and prn during daytime" (01/16/2018)   Osteoarthritis    "knees and hands" (01/16/2018)   Peripheral vascular disease ([place])    ? numbness, tingling arms and legs   PONV (postoperative nausea and vomiting)    Stroke ([place])    2014, 2015, 2016   Type II diabetes mellitus ([place])    Ventral hernia    hx of    Past Surgical History:   Procedure Laterality Date   ANKLE FRACTURE SURGERY Left 1970's   APPENDECTOMY  1970's   w/hysterectomy   BASAL CELL CARCINOMA EXCISION Left 05/2014   "shoulder" (01/16/2018)   CARDIAC CATHETERIZATION  10/10/2012   Dr [name].   CARDIAC CATHETERIZATION N/A 05/29/2015   Procedure: Right/Left Heart Cath and Coronary/Graft Angiography;  Surgeon: Larey Dresser, MD;  Location: [facility];  Service: Cardiovascular;  Laterality: N/A;   CAROTID ENDARTERECTOMY Left 03/2013   CAROTID STENT INSERTION Left 03/20/2013   Procedure: CAROTID STENT INSERTION;  Surgeon: [name], MD;  Location: [facility] CATH LAB;  Service: Cardiovascular;  Laterality: Left;  internal carotid   CEREBRAL ANGIOGRAM N/A 04/05/2011   Procedure: CEREBRAL ANGIOGRAM;  Surgeon: Angelia Mould, MD;  Location: [facility] CATH LAB;  Service: Cardiovascular;  Laterality: N/A;   CHOLECYSTECTOMY OPEN  2004   CORONARY ANGIOPLASTY WITH STENT PLACEMENT  01,02,05,06,07,08,11; 04/24/2013   "I've probably got ~ 10 stents by now" (04/24/2013)   CORONARY ANGIOPLASTY WITH STENT PLACEMENT  06/13/2013   "got 4 stents today" (06/13/2013)   CORONARY ARTERY BYPASS GRAFT  1220/11   "CABG  X5"   CORONARY STENT INTERVENTION N/A 10/20/2017   Procedure: CORONARY STENT INTERVENTION;  Surgeon: Troy Sine, MD;  Location: [place] [facility] LAB;  Service: Cardiovascular;  Laterality: N/A;   ESOPHAGOGASTRODUODENOSCOPY  08/03/2011   Procedure: ESOPHAGOGASTRODUODENOSCOPY (EGD);  Surgeon: Shann Medal, MD;  Location[facility];  Service: General;  Laterality: N/A;   ESOPHAGOGASTRODUODENOSCOPY (EGD) WITH PROPOFOL N/A 03/11/2014   Procedure: ESOPHAGOGASTRODUODENOSCOPY (EGD) WITH PROPOFOL;  Surgeon: Lafayette Dragon, MD;  Location: [facility];  Service: Endoscopy;  Laterality: N/A;   FRACTURE SURGERY     gall stone removal  05/2003   GASTRIC RESTRICTION SURGERY  1984   "stapeling"   HERNIA REPAIR  2004   "in my stomach; had OR on it twice", wire mesh on 1 hernia    LEFT HEART CATH AND CORS/GRAFTS ANGIOGRAPHY N/A 07/09/2016   Procedure: LEFT HEART CATH AND CORS/GRAFTS ANGIOGRAPHY;  Surgeon: Larey Dresser, MD;  Location: [place] [facility];  Service: Cardiovascular;  Laterality: N/A;   ORIF ANKLE FRACTURE Left 05/16/2018   ORIF ANKLE FRACTURE Left 05/16/2018   Procedure: OPEN REDUCTION INTERNAL FIXATION (ORIF) Left ankle with possible syndesmosis fixation;  Surgeon: Nicholes Stairs, MD;  Location: [facility];  Service: Orthopedics;  Laterality: Left;  127mn   OVARY SURGERY  1970's   "tumor removed"   PERCUTANEOUS CORONARY STENT INTERVENTION (PCI-S) N/A 06/13/2013   Procedure: PERCUTANEOUS CORONARY STENT INTERVENTION (PCI-S);  Surgeon: JJettie Booze MD;  [facility];  Service: Cardiovascular;  Laterality: N/A;   PERCUTANEOUS STENT INTERVENTION N/A 04/24/2013   Procedure: PERCUTANEOUS STENT INTERVENTION;  Surgeon: JJettie Booze MD;  [facility];  Service: Cardiovascular;  Laterality: N/A;   RIGHT/LEFT HEART CATH AND CORONARY/GRAFT ANGIOGRAPHY N/A 10/20/2017   Procedure: RIGHT/LEFT HEART CATH AND CORONARY/GRAFT ANGIOGRAPHY;  Surgeon: MLarey Dresser MD;  [facility];  Service: Cardiovascular;  Laterality: N/A;   ROOT CANAL  10/2000   TEE WITHOUT CARDIOVERSION N/A 01/18/2019   Procedure: TRANSESOPHAGEAL ECHOCARDIOGRAM (TEE);  Surgeon: MLarey Dresser MD;  [facility];  Service: Cardiovascular;  Laterality: N/A;   TIBIA FRACTURE SURGERY Right 1970's   rods and pins   TOOTH EXTRACTION     "1 on the upper; wisdom tooth on the lower" (01/16/2018)   TOTAL ABDOMINAL HYSTERECTOMY  1970's   w/ appendectomy    There were no vitals filed for this visit.   Subjective Assessment - 03/12/21 1431     Subjective EEG yesterday MRI of brain today. MD 2/28. increased LBP after lying in MRI machine    Currently in Pain? Yes    Pain Score 7     Pain Location Back                                [facility] Adult PT Treatment/Exercise - 03/12/21 0001       Ambulation/Gait   Gait Comments 4 laps with RW about 400 feet with increased LBP 9/10 and PRE 10/10   4 min 20 sec     Lumbar Exercises: Aerobic   UBE (Upper Arm Bike) L 3 6 min ( 3 fwd/3 back)    Nustep L 5 8 min      Lumbar Exercises: Machines for Strengthening   Cybex Lumbar Extension black tband 2 sets 15   trunk flex an dext   Other Lumbar Machine Exercise 25# seated row and lat 2 sets 15  Lumbar Exercises: Seated   Long Arc Quad on Chair Strengthening;Both;2 sets;10 reps;Weights    LAQ on Chair Weights (lbs) 3    Other Seated Lumbar Exercises green tband knee flexion 2 sets 10                       PT Short Term Goals - 01/06/21 1228       PT SHORT TERM GOAL #1   Title Patient to be independent with initial HEP.    Status Achieved               PT Long Term Goals - 03/10/21 1609       PT LONG TERM GOAL #3   Title Patient to score <14 sec on TUG testing in order to decrease risk of falls.    Status Partially Met   16.41 average of three trials 4WW                  Plan - 03/12/21 1506     Clinical Impression Statement pt tolerated ther ex well despite some increased pain and fatigue from test and getting up early. walking 4 laps about 400 feet with PRE 10 and LBP 9    PT Treatment/Interventions ADLs/Self Care Home Management;Cryotherapy;Electrical Stimulation;Iontophoresis 69m/ml Dexamethasone;Moist Heat;Gait training;Stair training;Functional mobility training;Therapeutic activities;Therapeutic exercise;Balance training;Neuromuscular re-education;Patient/family education;Manual techniques;Passive range of motion;Dry needling;Taping;Vasopneumatic Device;Joint Manipulations;Spinal Manipulations    PT Next Visit Plan progress func             Patient will benefit from skilled therapeutic intervention in order to improve the following  deficits and impairments:  Decreased balance, Difficulty walking, Decreased activity tolerance, Decreased strength, Pain  Visit Diagnosis: Muscle weakness (generalized)  Debility     Problem List Patient Active Problem List   Diagnosis Date Noted   Symptomatic bradycardia 01/16/2019   Normocytic anemia 01/16/2019   Diabetic peripheral neuropathy ([place] 01/12/2019   Neurologic abnormality 12/25/2018   Debility 09/21/2018   Hypotension 09/18/2018   Bradycardia 09/18/2018   AKI (acute kidney injury) ([place] 09/18/2018   Pressure injury of skin 09/18/2018   Ischemic cerebrovascular accident (CVA) of frontal lobe ([place] 09/13/2018   Fall 09/09/2018   History of CVA (cerebrovascular accident) 09/09/2018   Type II diabetes mellitus with renal manifestations ([place] 09/09/2018   CKD (chronic kidney disease), stage III 09/09/2018   Sepsis ([place] 09/09/2018   Depression with anxiety 09/09/2018   Slurred speech 09/09/2018   Ankle fracture, left 05/16/2018   Amiodarone pulmonary toxicity    Physical deconditioning    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[place]ott[place]    Acute on chronic congestive heart failure (HCC)    Chronic combined systolic (congestive) and diastolic (congestive) heart failure (HVir[place] 127/78/2423  Chronic systolic CHF (congestive heart failure) (HCat[place] 01/16/2018   Atrial fibrillation ([place] 10/21/2017   CAD (coronary artery disease) of bypass graft 10/20/2017   Coronary artery disease of bypass graft of native heart with stable angina pectoris (HCC)    Chronic low back pain 08/24/2016   Pain of left thumb 08/05/2016   Nocturnal hypoxia 06/02/2016   Hoarseness 04/05/2016   Laryngopharyngeal reflux (LPR) 04/05/2016   Pharyngoesophageal dysphagia 04/05/2016   Angina decubitus ([place] 05/22/2015   Chronic insomnia 05/06/2015   Common migraine 05/14/2014   Abnormality of gait 05/14/2014   Memory change 05/14/2014   Aneurysm, cerebral, nonruptured 05/14/2014   Cramp of limb-Left neck  05/10/2014   Hematemesis with nausea    Vomiting blood  Dizziness and giddiness 09/21/2013   Atypical chest pain 07/18/2013   Unstable angina ([place]) 04/09/2013   Carotid artery stenosis, symptomatic 03/20/2013   Cerebrovascular disease 07/10/2012   Cerebral artery occlusion with cerebral infarction ([place]) 07/10/2012   Mitral regurgitation 04/15/2012   TIA (transient ischemic attack) 04/15/2012   Occlusion and stenosis of carotid artery without mention of cerebral infarction 08/18/2011   Bariatric surgery status 06/17/2011   Speech abnormality 03/22/2011   Dyspnea 02/24/2011   PAPILLARY MUSCLE DYSFUNCTION, NON-RHEUMATIC 10/09/2008   UNSPECIFIED VITAMIN D DEFICIENCY 10/24/2007   MYOCARDIAL INFARCTION, HX OF 10/24/2007   PERSISTENT VOMITING 10/24/2007   OSTEOARTHRITIS 10/24/2007   MIGRAINES, HX OF 10/24/2007   Diabetes mellitus type 2, controlled, with complications ([place]) 68/34/1962   Hyperlipemia 01/16/2007   Obesity-post failed open gastroplasty 1984  01/16/2007   OBSTRUCTIVE SLEEP APNEA 01/16/2007   Essential hypertension 01/16/2007   Coronary atherosclerosis 01/16/2007   Asthma 01/16/2007   GERD 01/16/2007   VENTRAL HERNIA 01/16/2007    Brentt Fread,ANGIE, PTA 03/12/2021, 3:11 PM  [place]. [place], [place], [address] Phone: [phone]   Fax:  [phone]  Name: [name] [name] [name]: [id number] Date of Birth: May 31, 1945

## 2021-03-17 ENCOUNTER — Ambulatory Visit: Payer: Medicare Other | Admitting: Physical Therapy

## 2021-03-19 ENCOUNTER — Other Ambulatory Visit (HOSPITAL_COMMUNITY): Payer: Self-pay

## 2021-03-19 ENCOUNTER — Other Ambulatory Visit: Payer: Self-pay

## 2021-03-19 ENCOUNTER — Ambulatory Visit: Payer: Medicare Other | Admitting: Physical Therapy

## 2021-03-19 DIAGNOSIS — M6281 Muscle weakness (generalized): Secondary | ICD-10-CM | POA: Diagnosis not present

## 2021-03-19 DIAGNOSIS — R5381 Other malaise: Secondary | ICD-10-CM

## 2021-03-19 DIAGNOSIS — R262 Difficulty in walking, not elsewhere classified: Secondary | ICD-10-CM

## 2021-03-19 DIAGNOSIS — R2681 Unsteadiness on feet: Secondary | ICD-10-CM

## 2021-03-19 MED ORDER — ENTRESTO 97-103 MG PO TABS: 1.0000 | ORAL_TABLET | Freq: Two times a day (BID) | ORAL | 1 refills | Status: DC

## 2021-03-19 NOTE — Therapy (Signed)
[place]. [place], [place], [address] Phone: [phone]   Fax:  [phone]  Physical Therapy Treatment  Patient Details  Name: [name] [name] [name]: [id number] Date of Birth: Nov 25, 1945 Referring Provider (PT): Dr Loralie Champagne   Encounter Date: 03/19/2021   PT End of Session - 03/19/21 1112     Visit Number 64    Date for PT Re-Evaluation 04/07/21    PT Start Time 1055    PT Stop Time 1135    PT Time Calculation (min) 40 min             Past Medical History:  Diagnosis Date   Anemia    hx   Anxiety    Asthma    Basal cell carcinoma 05/2014   "left shoulder"   Bundle branch block, left    chronic/notes 07/18/2013   CHF (congestive heart failure) ([place])    Chronic insomnia 05/06/2015   Chronic kidney disease    frequency, sees dr Jamal Maes every 4 to 6 months (01/16/2018)   Chronic lower back pain    Claustrophobia    Common migraine 05/14/2014   Coronary artery disease    MI in 2001, 2002, 2006, 2011, 2014   Depression    Diabetic peripheral neuropathy ([place]) 01/12/2019   GERD (gastroesophageal reflux disease)    H/O hiatal hernia    Headache    "at least 2/month" (01/16/2018)   Heart murmur    Hyperlipidemia    Hypertension    Memory change 05/14/2014   Migraine    "5-6/year"  (01/16/2018)   Obesity 01-2010   Obstructive sleep apnea    "can't wear machine; I have claustrophobia" (01/16/2018), states she had a 2nd sleep study and does not have sleep apnea, her O2 decreases and now is on 2 L of O2 at night.   On home oxygen therapy    "2L at night and prn during daytime" (01/16/2018)   Osteoarthritis    "knees and hands" (01/16/2018)   Peripheral vascular disease ([place])    ? numbness, tingling arms and legs   PONV (postoperative nausea and vomiting)    Stroke ([place])    2014, 2015, 2016   Type II diabetes mellitus ([place])    Ventral hernia    hx of    Past Surgical History:   Procedure Laterality Date   ANKLE FRACTURE SURGERY Left 1970's   APPENDECTOMY  1970's   w/hysterectomy   BASAL CELL CARCINOMA EXCISION Left 05/2014   "shoulder" (01/16/2018)   CARDIAC CATHETERIZATION  10/10/2012   Dr [name].   CARDIAC CATHETERIZATION N/A 05/29/2015   Procedure: Right/Left Heart Cath and Coronary/Graft Angiography;  Surgeon: Larey Dresser, MD;  Location: [facility];  Service: Cardiovascular;  Laterality: N/A;   CAROTID ENDARTERECTOMY Left 03/2013   CAROTID STENT INSERTION Left 03/20/2013   Procedure: CAROTID STENT INSERTION;  Surgeon: [name], MD;  Location: [facility] CATH LAB;  Service: Cardiovascular;  Laterality: Left;  internal carotid   CEREBRAL ANGIOGRAM N/A 04/05/2011   Procedure: CEREBRAL ANGIOGRAM;  Surgeon: Angelia Mould, MD;  Location: [facility] CATH LAB;  Service: Cardiovascular;  Laterality: N/A;   CHOLECYSTECTOMY OPEN  2004   CORONARY ANGIOPLASTY WITH STENT PLACEMENT  01,02,05,06,07,08,11; 04/24/2013   "I've probably got ~ 10 stents by now" (04/24/2013)   CORONARY ANGIOPLASTY WITH STENT PLACEMENT  06/13/2013   "got 4 stents today" (06/13/2013)   CORONARY ARTERY BYPASS GRAFT  1220/11   "CABG  X5"   CORONARY STENT INTERVENTION N/A 10/20/2017   Procedure: CORONARY STENT INTERVENTION;  Surgeon: Troy Sine, MD;  Location: [place] [facility];  Service: Cardiovascular;  Laterality: N/A;   ESOPHAGOGASTRODUODENOSCOPY  08/03/2011   Procedure: ESOPHAGOGASTRODUODENOSCOPY (EGD);  Surgeon: Shann Medal, MD;  [facility];  Service: General;  Laterality: N/A;   ESOPHAGOGASTRODUODENOSCOPY (EGD) WITH PROPOFOL N/A 03/11/2014   Procedure: ESOPHAGOGASTRODUODENOSCOPY (EGD) WITH PROPOFOL;  Surgeon: Lafayette Dragon, MD;  [facility];  Service: Endoscopy;  Laterality: N/A;   FRACTURE SURGERY     gall stone removal  05/2003   GASTRIC RESTRICTION SURGERY  1984   "stapeling"   HERNIA REPAIR  2004   "in my stomach; had OR on it twice", wire mesh on 1 hernia    LEFT HEART CATH AND CORS/GRAFTS ANGIOGRAPHY N/A 07/09/2016   Procedure: LEFT HEART CATH AND CORS/GRAFTS ANGIOGRAPHY;  Surgeon: Larey Dresser, MD;  Location: [place] [facility];  Service: Cardiovascular;  Laterality: N/A;   ORIF ANKLE FRACTURE Left 05/16/2018   ORIF ANKLE FRACTURE Left 05/16/2018   Procedure: OPEN REDUCTION INTERNAL FIXATION (ORIF) Left ankle with possible syndesmosis fixation;  Surgeon: Nicholes Stairs, MD;  Location: [place];  Service: Orthopedics;  Laterality: Left;  180mn   OVARY SURGERY  1970's   "tumor removed"   PERCUTANEOUS CORONARY STENT INTERVENTION (PCI-S) N/A 06/13/2013   Procedure: PERCUTANEOUS CORONARY STENT INTERVENTION (PCI-S);  Surgeon: JJettie Booze MD;  [facility];  Service: Cardiovascular;  Laterality: N/A;   PERCUTANEOUS STENT INTERVENTION N/A 04/24/2013   Procedure: PERCUTANEOUS STENT INTERVENTION;  Surgeon: JJettie Booze MD;  [facility];  Service: Cardiovascular;  Laterality: N/A;   RIGHT/LEFT HEART CATH AND CORONARY/GRAFT ANGIOGRAPHY N/A 10/20/2017   Procedure: RIGHT/LEFT HEART CATH AND CORONARY/GRAFT ANGIOGRAPHY;  Surgeon: MLarey Dresser MD;  [facility];  Service: Cardiovascular;  Laterality: N/A;   ROOT CANAL  10/2000   TEE WITHOUT CARDIOVERSION N/A 01/18/2019   Procedure: TRANSESOPHAGEAL ECHOCARDIOGRAM (TEE);  Surgeon: MLarey Dresser MD;  [facility];  Service: Cardiovascular;  Laterality: N/A;   TIBIA FRACTURE SURGERY Right 1970's   rods and pins   TOOTH EXTRACTION     "1 on the upper; wisdom tooth on the lower" (01/16/2018)   TOTAL ABDOMINAL HYSTERECTOMY  1970's   w/ appendectomy    There were no vitals filed for this visit.   Subjective Assessment - 03/19/21 1101     Subjective had to cancel Tuesday , back is really hurting me and limiting all actvity    Currently in Pain? Yes    Pain Score 10-Worst pain ever    Pain Location Back                                [facility] Adult PT Treatment/Exercise - 03/19/21 0001       Modalities   Modalities Electrical Stimulation;Moist Heat      Moist Heat Therapy   Number Minutes Moist Heat 15 Minutes    Moist Heat Location Lumbar Spine      Electrical Stimulation   Electrical Stimulation Location LB    Electrical Stimulation Action IFC    Electrical Stimulation Parameters sitting    Electrical Stimulation Goals Pain      Manual Therapy   Manual Therapy Soft tissue mobilization    Soft tissue mobilization to LB in sitting  Trigger Point Dry Needling - 03/19/21 0001     Consent Given? Yes    Education Handout Provided Yes    Muscles Treated Back/Hip Lumbar multifidi    Lumbar multifidi Response Twitch response elicited;Palpable increased muscle length                     PT Short Term Goals - 01/06/21 1228       PT SHORT TERM GOAL #1   Title Patient to be independent with initial HEP.    Status Achieved               PT Long Term Goals - 03/10/21 1609       PT LONG TERM GOAL #3   Title Patient to score <14 sec on TUG testing in order to decrease risk of falls.    Status Partially Met   16.41 average of three trials 4WW                  Plan - 03/19/21 1112     Clinical Impression Statement no goals met this week d/t missing first visit d/t back pain and not feeling well and today arrived with continuing LBP limiting all func so focus on pain reilef with DN, STW and MH/Estim    PT Treatment/Interventions ADLs/Self Care Home Management;Cryotherapy;Electrical Stimulation;Iontophoresis 61m/ml Dexamethasone;Moist Heat;Gait training;Stair training;Functional mobility training;Therapeutic activities;Therapeutic exercise;Balance training;Neuromuscular re-education;Patient/family education;Manual techniques;Passive range of motion;Dry needling;Taping;Vasopneumatic Device;Joint Manipulations;Spinal Manipulations    PT  Next Visit Plan assess and progress             Patient will benefit from skilled therapeutic intervention in order to improve the following deficits and impairments:  Decreased balance, Difficulty walking, Decreased activity tolerance, Decreased strength, Pain  Visit Diagnosis: Muscle weakness (generalized)  Debility  Unsteadiness on feet  Difficulty in walking, not elsewhere classified     Problem List Patient Active Problem List   Diagnosis Date Noted   Symptomatic bradycardia 01/16/2019   Normocytic anemia 01/16/2019   Diabetic peripheral neuropathy ([place] 01/12/2019   Neurologic abnormality 12/25/2018   Debility 09/21/2018   Hypotension 09/18/2018   Bradycardia 09/18/2018   AKI (acute kidney injury) (HMontpel[place] 09/18/2018   Pressure injury of skin 09/18/2018   Ischemic cerebrovascular accident (CVA) of frontal lobe (HChurch[place] 09/13/2018   Fall 09/09/2018   History of CVA (cerebrovascular accident) 09/09/2018   Type II diabetes mellitus with renal manifestations (HBrain[place] 09/09/2018   CKD (chronic kidney disease), stage III 09/09/2018   Sepsis (HC[place] 09/09/2018   Depression with anxiety 09/09/2018   Slurred speech 09/09/2018   Ankle fracture, left 05/16/2018   Amiodarone pulmonary toxicity    Physical deconditioning    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HGen[place]    Acute on chronic congestive heart failure (HCC)    Chronic combined systolic (congestive) and diastolic (congestive) heart failure (HBowleg[place] 134/19/3790  Chronic systolic CHF (congestive heart failure) ([place] 01/16/2018   Atrial fibrillation (HM[place] 10/21/2017   CAD (coronary artery disease) of bypass graft 10/20/2017   Coronary artery disease of bypass graft of native heart with stable angina pectoris (HCC)    Chronic low back pain 08/24/2016   Pain of left thumb 08/05/2016   Nocturnal hypoxia 06/02/2016   Hoarseness 04/05/2016   Laryngopharyngeal reflux (LPR) 04/05/2016   Pharyngoesophageal dysphagia 04/05/2016   Angina  decubitus (H[place] Center 05/22/2015   Chronic insomnia 05/06/2015   Common migraine 05/14/2014   Abnormality of gait 05/14/2014   Memory change 05/14/2014   Aneurysm, cerebral,  nonruptured 05/14/2014   Cramp of limb-Left neck 05/10/2014   Hematemesis with nausea    Vomiting blood    Dizziness and giddiness 09/21/2013   Atypical chest pain 07/18/2013   Unstable angina ([place]) 04/09/2013   Carotid artery stenosis, symptomatic 03/20/2013   Cerebrovascular disease 07/10/2012   Cerebral artery occlusion with cerebral infarction ([place]) 07/10/2012   Mitral regurgitation 04/15/2012   TIA (transient ischemic attack) 04/15/2012   Occlusion and stenosis of carotid artery without mention of cerebral infarction 08/18/2011   Bariatric surgery status 06/17/2011   Speech abnormality 03/22/2011   Dyspnea 02/24/2011   PAPILLARY MUSCLE DYSFUNCTION, NON-RHEUMATIC 10/09/2008   UNSPECIFIED VITAMIN D DEFICIENCY 10/24/2007   MYOCARDIAL INFARCTION, HX OF 10/24/2007   PERSISTENT VOMITING 10/24/2007   OSTEOARTHRITIS 10/24/2007   MIGRAINES, HX OF 10/24/2007   Diabetes mellitus type 2, controlled, with complications ([place]) 15/52/0802   Hyperlipemia 01/16/2007   Obesity-post failed open gastroplasty 1984  01/16/2007   OBSTRUCTIVE SLEEP APNEA 01/16/2007   Essential hypertension 01/16/2007   Coronary atherosclerosis 01/16/2007   Asthma 01/16/2007   GERD 01/16/2007   VENTRAL HERNIA 01/16/2007    Sumner Boast, PT 03/19/2021, 11:49 AM  [place]. [place], [place], [address] Phone: [phone]   Fax:  [phone]  Name: KHAMRYN CALDERONE [name]: [id number] Date of Birth: 04/12/1945

## 2021-03-24 ENCOUNTER — Other Ambulatory Visit: Payer: Self-pay

## 2021-03-24 ENCOUNTER — Ambulatory Visit: Payer: Medicare Other | Admitting: Physical Therapy

## 2021-03-24 DIAGNOSIS — M6281 Muscle weakness (generalized): Secondary | ICD-10-CM | POA: Diagnosis not present

## 2021-03-24 DIAGNOSIS — R5381 Other malaise: Secondary | ICD-10-CM

## 2021-03-24 DIAGNOSIS — R2681 Unsteadiness on feet: Secondary | ICD-10-CM

## 2021-03-24 NOTE — Therapy (Signed)
[facility]. [place], [place], [address] Phone: [phone]   Fax:  [phone]  Physical Therapy Treatment  Patient Details  Name: [name] [name] [name]: [id number] Date of Birth: November 04, 1945 Referring Provider (PT): Dr Loralie Champagne   Encounter Date: 03/24/2021   PT End of Session - 03/24/21 1222     Visit Number 35    Date for PT Re-Evaluation 04/07/21    PT Start Time 1135    PT Stop Time 1225    PT Time Calculation (min) 50 min             Past Medical History:  Diagnosis Date   Anemia    hx   Anxiety    Asthma    Basal cell carcinoma 05/2014   "left shoulder"   Bundle branch block, left    chronic/notes 07/18/2013   CHF (congestive heart failure) ([place])    Chronic insomnia 05/06/2015   Chronic kidney disease    frequency, sees dr Jamal Maes every 4 to 6 months (01/16/2018)   Chronic lower back pain    Claustrophobia    Common migraine 05/14/2014   Coronary artery disease    MI in 2001, 2002, 2006, 2011, 2014   Depression    Diabetic peripheral neuropathy ([place]) 01/12/2019   GERD (gastroesophageal reflux disease)    H/O hiatal hernia    Headache    "at least 2/month" (01/16/2018)   Heart murmur    Hyperlipidemia    Hypertension    Memory change 05/14/2014   Migraine    "5-6/year"  (01/16/2018)   Obesity 01-2010   Obstructive sleep apnea    "can't wear machine; I have claustrophobia" (01/16/2018), states she had a 2nd sleep study and does not have sleep apnea, her O2 decreases and now is on 2 L of O2 at night.   On home oxygen therapy    "2L at night and prn during daytime" (01/16/2018)   Osteoarthritis    "knees and hands" (01/16/2018)   Peripheral vascular disease ([place])    ? numbness, tingling arms and legs   PONV (postoperative nausea and vomiting)    Stroke ([place])    2014, 2015, 2016   Type II diabetes mellitus ([place])    Ventral hernia    hx of    Past Surgical History:   Procedure Laterality Date   ANKLE FRACTURE SURGERY Left 1970's   APPENDECTOMY  1970's   w/hysterectomy   BASAL CELL CARCINOMA EXCISION Left 05/2014   "shoulder" (01/16/2018)   CARDIAC CATHETERIZATION  10/10/2012   Dr [name].   CARDIAC CATHETERIZATION N/A 05/29/2015   Procedure: Right/Left Heart Cath and Coronary/Graft Angiography;  Surgeon: Larey Dresser, MD;  Location: [facility] LAB;  Service: Cardiovascular;  Laterality: N/A;   CAROTID ENDARTERECTOMY Left 03/2013   CAROTID STENT INSERTION Left 03/20/2013   Procedure: CAROTID STENT INSERTION;  Surgeon: [name], MD;  Location: [facility] CATH LAB;  Service: Cardiovascular;  Laterality: Left;  internal carotid   CEREBRAL ANGIOGRAM N/A 04/05/2011   Procedure: CEREBRAL ANGIOGRAM;  Surgeon: Angelia Mould, MD;  Location: [facility] CATH LAB;  Service: Cardiovascular;  Laterality: N/A;   CHOLECYSTECTOMY OPEN  2004   CORONARY ANGIOPLASTY WITH STENT PLACEMENT  01,02,05,06,07,08,11; 04/24/2013   "I've probably got ~ 10 stents by now" (04/24/2013)   CORONARY ANGIOPLASTY WITH STENT PLACEMENT  06/13/2013   "got 4 stents today" (06/13/2013)   CORONARY ARTERY BYPASS GRAFT  1220/11   "CABG  X5"   CORONARY STENT INTERVENTION N/A 10/20/2017   Procedure: CORONARY STENT INTERVENTION;  Surgeon: Troy Sine, MD;  Location: [facility] CV LAB;  Service: Cardiovascular;  Laterality: N/A;   ESOPHAGOGASTRODUODENOSCOPY  08/03/2011   Procedure: ESOPHAGOGASTRODUODENOSCOPY (EGD);  Surgeon: Shann Medal, MD;  Location: [facility];  Service: General;  Laterality: N/A;   ESOPHAGOGASTRODUODENOSCOPY (EGD) WITH PROPOFOL N/A 03/11/2014   Procedure: ESOPHAGOGASTRODUODENOSCOPY (EGD) WITH PROPOFOL;  Surgeon: Lafayette Dragon, MD;  Location: [facility]OS[facility];  Service: Endoscopy;  Laterality: N/A;   FRACTURE SURGERY     gall stone removal  05/2003   GASTRIC RESTRICTION SURGERY  1984   "stapeling"   HERNIA REPAIR  2004   "in my stomach; had OR on it twice", wire mesh on 1 hernia    LEFT HEART CATH AND CORS/GRAFTS ANGIOGRAPHY N/A 07/09/2016   Procedure: LEFT HEART CATH AND CORS/GRAFTS ANGIOGRAPHY;  Surgeon: Larey Dresser, MD;  Location: [facility];  Service: Cardiovascular;  Laterality: N/A;   ORIF ANKLE FRACTURE Left 05/16/2018   ORIF ANKLE FRACTURE Left 05/16/2018   Procedure: OPEN REDUCTION INTERNAL FIXATION (ORIF) Left ankle with possible syndesmosis fixation;  Surgeon: Nicholes Stairs, MD;  Location: [facility];  Service: Orthopedics;  Laterality: Left;  159mn   OVARY SURGERY  1970's   "tumor removed"   PERCUTANEOUS CORONARY STENT INTERVENTION (PCI-S) N/A 06/13/2013   Procedure: PERCUTANEOUS CORONARY STENT INTERVENTION (PCI-S);  Surgeon: JJettie Booze MD;  [facility];  Service: Cardiovascular;  Laterality: N/A;   PERCUTANEOUS STENT INTERVENTION N/A 04/24/2013   Procedure: PERCUTANEOUS STENT INTERVENTION;  Surgeon: JJettie Booze MD;  [facility];  Service: Cardiovascular;  Laterality: N/A;   RIGHT/LEFT HEART CATH AND CORONARY/GRAFT ANGIOGRAPHY N/A 10/20/2017   Procedure: RIGHT/LEFT HEART CATH AND CORONARY/GRAFT ANGIOGRAPHY;  Surgeon: MLarey Dresser MD;  [facility];  Service: Cardiovascular;  Laterality: N/A;   ROOT CANAL  10/2000   TEE WITHOUT CARDIOVERSION N/A 01/18/2019   Procedure: TRANSESOPHAGEAL ECHOCARDIOGRAM (TEE);  Surgeon: MLarey Dresser MD;  [facility];  Service: Cardiovascular;  Laterality: N/A;   TIBIA FRACTURE SURGERY Right 1970's   rods and pins   TOOTH EXTRACTION     "1 on the upper; wisdom tooth on the lower" (01/16/2018)   TOTAL ABDOMINAL HYSTERECTOMY  1970's   w/ appendectomy    There were no vitals filed for this visit.   Subjective Assessment - 03/24/21 1143     Subjective last tx definately helped, 50% better than I was. partail fall Sunday when left knee buckles    Currently in Pain? Yes    Pain Score 5     Pain Location Back                                [facility] Adult PT Treatment/Exercise - 03/24/21 0001       Ambulation/Gait   Gait Comments amb outside with RW 400 feet with PRE 4-5/10 and LBP 5/10      Lumbar Exercises: Aerobic   Nustep L 5 8 min   with MH     Lumbar Exercises: Machines for Strengthening   Cybex Lumbar Extension black tband 2 sets 10   trunk flex and ext   Other Lumbar Machine Exercise 25# seated row and lat 2 sets 10      Knee/Hip Exercises: Seated   Long Arc Quad Strengthening;Both;2 sets;10 reps;Weights  Long Arc Quad Weight 5 lbs.    Ball Squeeze 20    Clamshell with [employer] Strengthening;Both;2 sets;10 reps   blue tband   Hamstring Curl Both;2 sets;5 reps    Hamstring Limitations green tband    Sit to Sand 10 reps;without UE support   from chair                      PT Short Term Goals - 01/06/21 1228       PT SHORT TERM GOAL #1   Title Patient to be independent with initial HEP.    Status Achieved               PT Long Term Goals - 03/24/21 1225       PT LONG TERM GOAL #1   Title Patient to be independent with advanced HEP.    Status Achieved      PT LONG TERM GOAL #2   Title Patient to demonstrate B LE strength >/=4+/5.    Baseline grossly 4/5 .pt also fatigues quickly    Status Partially Met      PT LONG TERM GOAL #3   Title Patient to score <14 sec on TUG testing in order to decrease risk of falls.    Status Partially Met      PT LONG TERM GOAL #5   Title Patient to report tolerance for 15 min of walking with LRAD without fatigue limiting.    Baseline 5 min today    Status Partially Met                   Plan - 03/24/21 1223     Clinical Impression Statement arrived with less pain than last session, reports a fall SUnday whne LEft knee gave out. Amb outisde with RW 400 feet with PRE 4-5/10 and pain 5/10 ( last time we walked ratings 2 x that) pt sees MD 2/28 for test results whcih will help up  determine more PT or not? Pt aware of visit limitations.    PT Treatment/Interventions ADLs/Self Care Home Management;Cryotherapy;Electrical Stimulation;Iontophoresis 16m/ml Dexamethasone;Moist Heat;Gait training;Stair training;Functional mobility training;Therapeutic activities;Therapeutic exercise;Balance training;Neuromuscular re-education;Patient/family education;Manual techniques;Passive range of motion;Dry needling;Taping;Vasopneumatic Device;Joint Manipulations;Spinal Manipulations    PT Next Visit Plan ? DN for LB. progress strength             Patient will benefit from skilled therapeutic intervention in order to improve the following deficits and impairments:  Decreased balance, Difficulty walking, Decreased activity tolerance, Decreased strength, Pain  Visit Diagnosis: Muscle weakness (generalized)  Debility  Unsteadiness on feet     Problem List Patient Active Problem List   Diagnosis Date Noted   Symptomatic bradycardia 01/16/2019   Normocytic anemia 01/16/2019   Diabetic peripheral neuropathy ([place] 01/12/2019   Neurologic abnormality 12/25/2018   Debility 09/21/2018   Hypotension 09/18/2018   Bradycardia 09/18/2018   AKI (acute kidney injury) ([place]ila Do[place] 09/18/2018   Pressure injury of skin 09/18/2018   Ischemic cerebrovascular accident (CVA) of frontal lobe ([place] 09/13/2018   Fall 09/09/2018   History of CVA (cerebrovascular accident) 09/09/2018   Type II diabetes mellitus with renal manifestations ([place] 09/09/2018   CKD (chronic kidney disease), stage III 09/09/2018   Sepsis ([place] 09/09/2018   Depression with anxiety 09/09/2018   Slurred speech 09/09/2018   Ankle fracture, left 05/16/2018   Amiodarone pulmonary toxicity    Physical deconditioning    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[place]  Acute on chronic congestive heart failure (HCC)    Chronic combined systolic (congestive) and diastolic (congestive) heart failure ([place]) 06/00/4599   Chronic systolic CHF  (congestive heart failure) ([place]) 01/16/2018   Atrial fibrillation ([place]) 10/21/2017   CAD (coronary artery disease) of bypass graft 10/20/2017   Coronary artery disease of bypass graft of native heart with stable angina pectoris (HCC)    Chronic low back pain 08/24/2016   Pain of left thumb 08/05/2016   Nocturnal hypoxia 06/02/2016   Hoarseness 04/05/2016   Laryngopharyngeal reflux (LPR) 04/05/2016   Pharyngoesophageal dysphagia 04/05/2016   Angina decubitus ([place]) 05/22/2015   Chronic insomnia 05/06/2015   Common migraine 05/14/2014   Abnormality of gait 05/14/2014   Memory change 05/14/2014   Aneurysm, cerebral, nonruptured 05/14/2014   Cramp of limb-Left neck 05/10/2014   Hematemesis with nausea    Vomiting blood    Dizziness and giddiness 09/21/2013   Atypical chest pain 07/18/2013   Unstable angina ([place]) 04/09/2013   Carotid artery stenosis, symptomatic 03/20/2013   Cerebrovascular disease 07/10/2012   Cerebral artery occlusion with cerebral infarction ([place]) 07/10/2012   Mitral regurgitation 04/15/2012   TIA (transient ischemic attack) 04/15/2012   Occlusion and stenosis of carotid artery without mention of cerebral infarction 08/18/2011   Bariatric surgery status 06/17/2011   Speech abnormality 03/22/2011   Dyspnea 02/24/2011   PAPILLARY MUSCLE DYSFUNCTION, NON-RHEUMATIC 10/09/2008   UNSPECIFIED VITAMIN D DEFICIENCY 10/24/2007   MYOCARDIAL INFARCTION, HX OF 10/24/2007   PERSISTENT VOMITING 10/24/2007   OSTEOARTHRITIS 10/24/2007   MIGRAINES, HX OF 10/24/2007   Diabetes mellitus type 2, controlled, with complications ([place]) 77/41/4239   Hyperlipemia 01/16/2007   Obesity-post failed open gastroplasty 1984  01/16/2007   OBSTRUCTIVE SLEEP APNEA 01/16/2007   Essential hypertension 01/16/2007   Coronary atherosclerosis 01/16/2007   Asthma 01/16/2007   GERD 01/16/2007   VENTRAL HERNIA 01/16/2007    Charliee Krenz,ANGIE, PTA 03/24/2021, 12:27 PM  [facility]. [place], [place], [address] Phone: [phone]   Fax:  [phone]  Name: [name] [name] [name]: [id number] Date of Birth: 1945/05/20

## 2021-03-26 ENCOUNTER — Encounter: Payer: Self-pay | Admitting: Physical Therapy

## 2021-03-26 ENCOUNTER — Other Ambulatory Visit: Payer: Self-pay

## 2021-03-26 ENCOUNTER — Ambulatory Visit: Payer: Medicare Other | Admitting: Physical Therapy

## 2021-03-26 DIAGNOSIS — M6281 Muscle weakness (generalized): Secondary | ICD-10-CM

## 2021-03-26 DIAGNOSIS — R2681 Unsteadiness on feet: Secondary | ICD-10-CM

## 2021-03-26 DIAGNOSIS — R262 Difficulty in walking, not elsewhere classified: Secondary | ICD-10-CM

## 2021-03-26 DIAGNOSIS — R5381 Other malaise: Secondary | ICD-10-CM

## 2021-03-26 NOTE — Therapy (Signed)
[facility]. [place], [place], [address] Phone: [phone]   Fax:  [phone]  Physical Therapy Treatment  Patient Details  Name: [name] [name] [name]: [id number] Date of Birth: Jul 24, 1945 Referring Provider (PT): Dr Loralie Champagne   Encounter Date: 03/26/2021   PT End of Session - 03/26/21 1057     Visit Number 32    Date for PT Re-Evaluation 04/07/21    PT Start Time 1015    PT Stop Time 1059    PT Time Calculation (min) 44 min    Activity Tolerance Patient tolerated treatment well    Behavior During Therapy [facility] for tasks assessed/performed             Past Medical History:  Diagnosis Date   Anemia    hx   Anxiety    Asthma    Basal cell carcinoma 05/2014   "left shoulder"   Bundle branch block, left    chronic/notes 07/18/2013   CHF (congestive heart failure) ([place])    Chronic insomnia 05/06/2015   Chronic kidney disease    frequency, sees dr Jamal Maes every 4 to 6 months (01/16/2018)   Chronic lower back pain    Claustrophobia    Common migraine 05/14/2014   Coronary artery disease    MI in 2001, 2002, 2006, 2011, 2014   Depression    Diabetic peripheral neuropathy ([place]) 01/12/2019   GERD (gastroesophageal reflux disease)    H/O hiatal hernia    Headache    "at least 2/month" (01/16/2018)   Heart murmur    Hyperlipidemia    Hypertension    Memory change 05/14/2014   Migraine    "5-6/year"  (01/16/2018)   Obesity 01-2010   Obstructive sleep apnea    "can't wear machine; I have claustrophobia" (01/16/2018), states she had a 2nd sleep study and does not have sleep apnea, her O2 decreases and now is on 2 L of O2 at night.   On home oxygen therapy    "2L at night and prn during daytime" (01/16/2018)   Osteoarthritis    "knees and hands" (01/16/2018)   Peripheral vascular disease ([place])    ? numbness, tingling arms and legs   PONV (postoperative nausea and vomiting)    Stroke ([place])     2014, 2015, 2016   Type II diabetes mellitus ([place])    Ventral hernia    hx of    Past Surgical History:  Procedure Laterality Date   ANKLE FRACTURE SURGERY Left 1970's   APPENDECTOMY  1970's   w/hysterectomy   BASAL CELL CARCINOMA EXCISION Left 05/2014   "shoulder" (01/16/2018)   CARDIAC CATHETERIZATION  10/10/2012   Dr [name].   CARDIAC CATHETERIZATION N/A 05/29/2015   Procedure: Right/Left Heart Cath and Coronary/Graft Angiography;  Surgeon: Larey Dresser, MD;  Location: [facility] CV LAB;  Service: Cardiovascular;  Laterality: N/A;   CAROTID ENDARTERECTOMY Left 03/2013   CAROTID STENT INSERTION Left 03/20/2013   Procedure: CAROTID STENT INSERTION;  Surgeon: [name], MD;  Location: [facility] CATH LAB;  Service: Cardiovascular;  Laterality: Left;  internal carotid   CEREBRAL ANGIOGRAM N/A 04/05/2011   Procedure: CEREBRAL ANGIOGRAM;  Surgeon: Angelia Mould, MD;  Location: [facility] CATH LAB;  Service: Cardiovascular;  Laterality: N/A;   CHOLECYSTECTOMY OPEN  2004   CORONARY ANGIOPLASTY WITH STENT PLACEMENT  01,02,05,06,07,08,11; 04/24/2013   "I've probably got ~ 10 stents by now" (04/24/2013)   CORONARY ANGIOPLASTY WITH STENT PLACEMENT  06/13/2013   "got 4 stents today" (06/13/2013)   CORONARY ARTERY BYPASS GRAFT  1220/11   "CABG X5"   CORONARY STENT INTERVENTION N/A 10/20/2017   Procedure: CORONARY STENT INTERVENTION;  Surgeon: Troy Sine, MD;  Location: [facility] CV LAB;  Service: Cardiovascular;  Laterality: N/A;   ESOPHAGOGASTRODUODENOSCOPY  08/03/2011   Procedure: ESOPHAGOGASTRODUODENOSCOPY (EGD);  Surgeon: Shann Medal, MD;  [facility];  Service: General;  Laterality: N/A;   ESOPHAGOGASTRODUODENOSCOPY (EGD) WITH PROPOFOL N/A 03/11/2014   Procedure: ESOPHAGOGASTRODUODENOSCOPY (EGD) WITH PROPOFOL;  Surgeon: Lafayette Dragon, MD;  [facility];  Service: Endoscopy;  Laterality: N/A;   FRACTURE SURGERY     gall stone removal  05/2003   GASTRIC RESTRICTION  SURGERY  1984   "stapeling"   HERNIA REPAIR  2004   "in my stomach; had OR on it twice", wire mesh on 1 hernia   LEFT HEART CATH AND CORS/GRAFTS ANGIOGRAPHY N/A 07/09/2016   Procedure: LEFT HEART CATH AND CORS/GRAFTS ANGIOGRAPHY;  Surgeon: Larey Dresser, MD;  Location: [facility] LAB;  Service: Cardiovascular;  Laterality: N/A;   ORIF ANKLE FRACTURE Left 05/16/2018   ORIF ANKLE FRACTURE Left 05/16/2018   Procedure: OPEN REDUCTION INTERNAL FIXATION (ORIF) Left ankle with possible syndesmosis fixation;  Surgeon: Nicholes Stairs, MD;  Location: [facility];  Service: Orthopedics;  Laterality: Left;  123mn   OVARY SURGERY  1970's   "tumor removed"   PERCUTANEOUS CORONARY STENT INTERVENTION (PCI-S) N/A 06/13/2013   Procedure: PERCUTANEOUS CORONARY STENT INTERVENTION (PCI-S);  Surgeon: JJettie Booze MD;  [facility];  Service: Cardiovascular;  Laterality: N/A;   PERCUTANEOUS STENT INTERVENTION N/A 04/24/2013   Procedure: PERCUTANEOUS STENT INTERVENTION;  Surgeon: JJettie Booze MD;  [facility];  Service: Cardiovascular;  Laterality: N/A;   RIGHT/LEFT HEART CATH AND CORONARY/GRAFT ANGIOGRAPHY N/A 10/20/2017   Procedure: RIGHT/LEFT HEART CATH AND CORONARY/GRAFT ANGIOGRAPHY;  Surgeon: MLarey Dresser MD;  [facility];  Service: Cardiovascular;  Laterality: N/A;   ROOT CANAL  10/2000   TEE WITHOUT CARDIOVERSION N/A 01/18/2019   Procedure: TRANSESOPHAGEAL ECHOCARDIOGRAM (TEE);  Surgeon: MLarey Dresser MD;  [facility];  Service: Cardiovascular;  Laterality: N/A;   TIBIA FRACTURE SURGERY Right 1970's   rods and pins   TOOTH EXTRACTION     "1 on the upper; wisdom tooth on the lower" (01/16/2018)   TOTAL ABDOMINAL HYSTERECTOMY  1970's   w/ appendectomy    There were no vitals filed for this visit.   Subjective Assessment - 03/26/21 1015     Subjective Doing pretty good today    Currently in Pain? Yes    Pain Score 5     Pain  Location Back                               [facility] Adult PT Treatment/Exercise - 03/26/21 0001       Lumbar Exercises: Aerobic   UBE (Upper Arm Bike) L 3 6 min ( 3 fwd/3 back)      Lumbar Exercises: Standing   Row Strengthening;Both;Theraband;15 reps   x2   Theraband Level (Row) Level 4 (Blue)    Shoulder Extension Strengthening;Both;Theraband;20 reps    Theraband Level (Shoulder Extension) Level 3 (Green)    Other Standing Lumbar Exercises OHP yellow ball 2x12    Other Standing Lumbar Exercises Ab sets Pball 2x10      Lumbar  Exercises: Seated   Other Seated Lumbar Exercises March 5lb 2x10    Other Seated Lumbar Exercises yellow ball slams 2x10      Knee/Hip Exercises: Seated   Long Arc Quad Strengthening;Both;2 sets;10 reps;Weights    Long Arc Quad Weight 5 lbs.    Hamstring Curl Both;2 sets;10 reps    Hamstring Limitations blue Band    Sit to Sand 2 sets;without UE support;15 reps;10 reps                       PT Short Term Goals - 01/06/21 1228       PT SHORT TERM GOAL #1   Title Patient to be independent with initial HEP.    Status Achieved               PT Long Term Goals - 03/24/21 1225       PT LONG TERM GOAL #1   Title Patient to be independent with advanced HEP.    Status Achieved      PT LONG TERM GOAL #2   Title Patient to demonstrate B LE strength >/=4+/5.    Baseline grossly 4/5 .pt also fatigues quickly    Status Partially Met      PT LONG TERM GOAL #3   Title Patient to score <14 sec on TUG testing in order to decrease risk of falls.    Status Partially Met      PT LONG TERM GOAL #5   Title Patient to report tolerance for 15 min of walking with LRAD without fatigue limiting.    Baseline 5 min today    Status Partially Met                   Plan - 03/26/21 1057     Clinical Impression Statement Pt did well today with the addition to more core strength. Ab sets, overhead ball slams, and OHP  interventions performed well to pre fatigue core. Pt stated she could feel her core working more throughout session after postural correction were made. No reports of pain. Cues to increase speed with sit to stands.    Personal Factors and Comorbidities Age;Comorbidity 2    Comorbidities CHF, Pneumonia    Examination-Activity Limitations Locomotion Level;Stairs    Examination-Participation Restrictions Occupation;Community Activity    Stability/Clinical Decision Making Evolving/Moderate complexity    Rehab Potential Good    PT Frequency 1x / week    PT Duration 4 weeks    PT Treatment/Interventions ADLs/Self Care Home Management;Cryotherapy;Electrical Stimulation;Iontophoresis 12m/ml Dexamethasone;Moist Heat;Gait training;Stair training;Functional mobility training;Therapeutic activities;Therapeutic exercise;Balance training;Neuromuscular re-education;Patient/family education;Manual techniques;Passive range of motion;Dry needling;Taping;Vasopneumatic Device;Joint Manipulations;Spinal Manipulations    PT Next Visit Plan ? DN for LB. progress strength             Patient will benefit from skilled therapeutic intervention in order to improve the following deficits and impairments:  Decreased balance, Difficulty walking, Decreased activity tolerance, Decreased strength, Pain  Visit Diagnosis: Muscle weakness (generalized)  Unsteadiness on feet  Difficulty in walking, not elsewhere classified  Debility     Problem List Patient Active Problem List   Diagnosis Date Noted   Symptomatic bradycardia 01/16/2019   Normocytic anemia 01/16/2019   Diabetic peripheral neuropathy ([place] 01/12/2019   Neurologic abnormality 12/25/2018   Debility 09/21/2018   Hypotension 09/18/2018   Bradycardia 09/18/2018   AKI (acute kidney injury) ([place]Com[place] 09/18/2018   Pressure injury of skin 09/18/2018   Ischemic cerebrovascular accident (CVA) of frontal  lobe ([place]) 09/13/2018   Fall 09/09/2018   History of  CVA (cerebrovascular accident) 09/09/2018   Type II diabetes mellitus with renal manifestations ([place]) 09/09/2018   CKD (chronic kidney disease), stage III 09/09/2018   Sepsis ([place]) 09/09/2018   Depression with anxiety 09/09/2018   Slurred speech 09/09/2018   Ankle fracture, left 05/16/2018   Amiodarone pulmonary toxicity    Physical deconditioning    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[place])    Acute on chronic congestive heart failure (HCC)    Chronic combined systolic (congestive) and diastolic (congestive) heart failure ([place]) 08/12/1973   Chronic systolic CHF (congestive heart failure) ([place]) 01/16/2018   Atrial fibrillation ([place]) 10/21/2017   CAD (coronary artery disease) of bypass graft 10/20/2017   Coronary artery disease of bypass graft of native heart with stable angina pectoris (HCC)    Chronic low back pain 08/24/2016   Pain of left thumb 08/05/2016   Nocturnal hypoxia 06/02/2016   Hoarseness 04/05/2016   Laryngopharyngeal reflux (LPR) 04/05/2016   Pharyngoesophageal dysphagia 04/05/2016   Angina decubitus ([place]) 05/22/2015   Chronic insomnia 05/06/2015   Common migraine 05/14/2014   Abnormality of gait 05/14/2014   Memory change 05/14/2014   Aneurysm, cerebral, nonruptured 05/14/2014   Cramp of limb-Left neck 05/10/2014   Hematemesis with nausea    Vomiting blood    Dizziness and giddiness 09/21/2013   Atypical chest pain 07/18/2013   Unstable angina ([place]) 04/09/2013   Carotid artery stenosis, symptomatic 03/20/2013   Cerebrovascular disease 07/10/2012   Cerebral artery occlusion with cerebral infarction ([place]) 07/10/2012   Mitral regurgitation 04/15/2012   TIA (transient ischemic attack) 04/15/2012   Occlusion and stenosis of carotid artery without mention of cerebral infarction 08/18/2011   Bariatric surgery status 06/17/2011   Speech abnormality 03/22/2011   Dyspnea 02/24/2011   PAPILLARY MUSCLE DYSFUNCTION, NON-RHEUMATIC 10/09/2008   UNSPECIFIED VITAMIN D DEFICIENCY  10/24/2007   MYOCARDIAL INFARCTION, HX OF 10/24/2007   PERSISTENT VOMITING 10/24/2007   OSTEOARTHRITIS 10/24/2007   MIGRAINES, HX OF 10/24/2007   Diabetes mellitus type 2, controlled, with complications ([place]) 88/32/5498   Hyperlipemia 01/16/2007   Obesity-post failed open gastroplasty 1984  01/16/2007   OBSTRUCTIVE SLEEP APNEA 01/16/2007   Essential hypertension 01/16/2007   Coronary atherosclerosis 01/16/2007   Asthma 01/16/2007   GERD 01/16/2007   VENTRAL HERNIA 01/16/2007    Scot Jun, PTA 03/26/2021, 11:00 AM  [place]. [place], [place], [address] Phone: [phone]   Fax:  [phone]  Name: MARIKO NOWAKOWSKI [name]: [id number] Date of Birth: [birth year]

## 2021-03-27 NOTE — Telephone Encounter (Signed)
Called [employer] to check the status of the patient's application. Representative stated that they only received the doctors portion of the application.   In lieu of continuing to wait for assistance, the patient was approved for a [employer] that will cover the cost of Entresto. Total amount awarded, $10,000.  ID [id number] BIN [id number] PCN [id number] [id number] 02/25/21-02/24/22

## 2021-03-30 ENCOUNTER — Other Ambulatory Visit (HOSPITAL_COMMUNITY): Payer: Self-pay

## 2021-03-30 MED ORDER — ENTRESTO 97-103 MG PO TABS: 1.0000 | ORAL_TABLET | Freq: Two times a day (BID) | ORAL | 3 refills | Status: DC

## 2021-03-30 NOTE — Telephone Encounter (Signed)
Advanced Heart Failure Patient Advocate Encounter  Called and spoke with the patient. Sent her a copy of the [place] information. Sent 90 day RX request to [facility] (EN) to send to [facility] with the attached [place] information.   Charlann Boxer, CPhT

## 2021-03-31 ENCOUNTER — Other Ambulatory Visit (HOSPITAL_COMMUNITY): Payer: Self-pay

## 2021-03-31 ENCOUNTER — Ambulatory Visit (HOSPITAL_COMMUNITY)
Admission: RE | Admit: 2021-03-31 | Discharge: 2021-03-31 | Disposition: A | Discharge status: Home / Self Care | Payer: Medicare Other | Source: Ambulatory Visit | Attending: Cardiology | Admitting: Cardiology

## 2021-03-31 ENCOUNTER — Encounter (HOSPITAL_COMMUNITY): Payer: Self-pay | Admitting: Cardiology

## 2021-03-31 ENCOUNTER — Other Ambulatory Visit: Payer: Self-pay

## 2021-03-31 VITALS — BP 130/78 | HR 59 | Wt 218.8 lb

## 2021-03-31 DIAGNOSIS — I208 Other forms of angina pectoris: Secondary | ICD-10-CM | POA: Diagnosis not present

## 2021-03-31 DIAGNOSIS — E785 Hyperlipidemia, unspecified: Secondary | ICD-10-CM | POA: Diagnosis not present

## 2021-03-31 DIAGNOSIS — I493 Ventricular premature depolarization: Secondary | ICD-10-CM | POA: Insufficient documentation

## 2021-03-31 DIAGNOSIS — I5032 Chronic diastolic (congestive) heart failure: Secondary | ICD-10-CM

## 2021-03-31 DIAGNOSIS — I447 Left bundle-branch block, unspecified: Secondary | ICD-10-CM | POA: Diagnosis not present

## 2021-03-31 DIAGNOSIS — I5022 Chronic systolic (congestive) heart failure: Secondary | ICD-10-CM | POA: Insufficient documentation

## 2021-03-31 LAB — CBC
HCT: 47.2 % — ABNORMAL HIGH (ref 36.0–46.0)
Hemoglobin: 15.7 g/dL — ABNORMAL HIGH (ref 12.0–15.0)
MCH: 32 pg (ref 26.0–34.0)
MCHC: 33.3 g/dL (ref 30.0–36.0)
MCV: 96.3 fL (ref 80.0–100.0)
Platelets: 153 10*3/uL (ref 150–400)
RBC: 4.9 MIL/uL (ref 3.87–5.11)
RDW: 12.9 % (ref 11.5–15.5)
WBC: 8.1 10*3/uL (ref 4.0–10.5)
nRBC: 0 % (ref 0.0–0.2)

## 2021-03-31 LAB — LIPID PANEL
Cholesterol: 173 mg/dL (ref 0–200)
HDL: 58 mg/dL (ref >40)
LDL Cholesterol: 102 mg/dL — ABNORMAL HIGH (ref 0–99)
Total CHOL/HDL Ratio: 3 RATIO
Triglycerides: 65 mg/dL (ref <150)
VLDL: 13 mg/dL (ref 0–40)

## 2021-03-31 LAB — BASIC METABOLIC PANEL
Anion gap: 8 (ref 5–15)
BUN: 17 mg/dL (ref 8–23)
CO2: 25 mmol/L (ref 22–32)
Calcium: 9.4 mg/dL (ref 8.9–10.3)
Chloride: 107 mmol/L (ref 98–111)
Creatinine, Ser: 0.89 mg/dL (ref 0.44–1.00)
GFR, Estimated: >60 mL/min (ref >60)
Glucose, Bld: 128 mg/dL — ABNORMAL HIGH (ref 70–99)
Potassium: 4.2 mmol/L (ref 3.5–5.1)
Sodium: 140 mmol/L (ref 135–145)

## 2021-03-31 LAB — BRAIN NATRIURETIC PEPTIDE: B Natriuretic Peptide: 339.3 pg/mL — ABNORMAL HIGH (ref 0.0–100.0)

## 2021-03-31 MED ORDER — TORSEMIDE 20 MG PO TABS: 10.0000 mg | ORAL_TABLET | ORAL | 1 refills | Status: DC

## 2021-03-31 NOTE — Patient Instructions (Signed)
Thank you for your visit today.  Restart Torsemide 10 mg every other day   Labs done today, your results will be available in MyChart, we will contact you for abnormal readings.  You are scheduled for a Cardiac Catheterization on Monday, March 20 with Dr. Loralie Champagne.  1. Please arrive at the [facility] (Main Entrance A) at [facility]: [address] [place], [place] [address] at 7:30 AM (This time is two hours before your procedure to ensure your preparation). Free valet parking service is available.   Special note: Every effort is made to have your procedure done on time. Please understand that emergencies sometimes delay scheduled procedures.  2. Diet: Do not eat solid foods after midnight.  The patient may have clear liquids until 5am upon the day of the procedure.  3. Labs: You will need to have blood drawn on morning of the procedure  4. Medication instructions in preparation for your procedure:   Contrast Allergy: No  Stop taking Eliquis (Apixiban) on Sunday, March 19.  Stop taking, lasix & Spironolactone  Monday, March 20,  Take only half a dose of your insulin the night before your procedure and hold it the morning of your procedure  On the morning of your procedure, take any morning medicines NOT listed above.  You may use sips of water.  5. Plan for one night stay--bring personal belongings. 6. Bring a current list of your medications and current insurance cards. 7. You MUST have a responsible person to drive you home. 8. Someone MUST be with you the first 24 hours after you arrive home or your discharge will be delayed. 9. Please wear clothes that are easy to get on and off and wear slip-on shoes.  Your physician recommends that you schedule a follow-up appointment in: 2 weeks after your Cath  If you have any questions or concerns before your next appointment please send [place] a message through [place] or call our office at [phone].    TO LEAVE A  MESSAGE FOR THE NURSE SELECT OPTION 2, PLEASE LEAVE A MESSAGE INCLUDING: YOUR NAME DATE OF BIRTH CALL BACK NUMBER REASON FOR CALL**this is important as we prioritize the call backs  YOU WILL RECEIVE A CALL BACK THE SAME DAY AS LONG AS YOU CALL BEFORE 4:00 PM  At the [facility], you and your health needs are our priority. As part of our continuing mission to provide you with exceptional heart care, we have created designated Provider Care Teams. These Care Teams include your primary Cardiologist (physician) and Advanced Practice Providers (APPs- Physician Assistants and Nurse Practitioners) who all work together to provide you with the care you need, when you need it.   You may see any of the following providers on your designated Care Team at your next follow up: Dr Glori Bickers Dr Haynes Kerns, NP Lyda Jester, [place] [facility] [place], [place] Audry Riles, PharmD   Please be sure to bring in all your medications bottles to every appointment.

## 2021-04-01 ENCOUNTER — Ambulatory Visit: Payer: Medicare Other | Attending: Cardiology | Admitting: Physical Therapy

## 2021-04-01 ENCOUNTER — Encounter: Payer: Self-pay | Admitting: Physical Therapy

## 2021-04-01 DIAGNOSIS — R262 Difficulty in walking, not elsewhere classified: Secondary | ICD-10-CM

## 2021-04-01 DIAGNOSIS — M6281 Muscle weakness (generalized): Secondary | ICD-10-CM

## 2021-04-01 DIAGNOSIS — R2681 Unsteadiness on feet: Secondary | ICD-10-CM | POA: Diagnosis present

## 2021-04-01 DIAGNOSIS — R5381 Other malaise: Secondary | ICD-10-CM

## 2021-04-01 NOTE — Therapy (Signed)
[place]. [place], [place], [address] Phone: [phone]   Fax:  [phone]  Physical Therapy Treatment  Patient Details  Name: [name] [name] [name]: [id number] Date of Birth: 1946/01/03 Referring Provider (PT): Dr Loralie Champagne   Encounter Date: 04/01/2021   PT End of Session - 04/01/21 1223     Visit Number 69    Date for PT Re-Evaluation 04/07/21    PT Start Time 1145    PT Stop Time 1226    PT Time Calculation (min) 41 min    Activity Tolerance Patient tolerated treatment well    Behavior During Therapy [facility] for tasks assessed/performed             Past Medical History:  Diagnosis Date   Anemia    hx   Anxiety    Asthma    Basal cell carcinoma 05/2014   "left shoulder"   Bundle branch block, left    chronic/notes 07/18/2013   CHF (congestive heart failure) ([place])    Chronic insomnia 05/06/2015   Chronic kidney disease    frequency, sees dr Jamal Maes every 4 to 6 months (01/16/2018)   Chronic lower back pain    Claustrophobia    Common migraine 05/14/2014   Coronary artery disease    MI in 2001, 2002, 2006, 2011, 2014   Depression    Diabetic peripheral neuropathy ([place]) 01/12/2019   GERD (gastroesophageal reflux disease)    H/O hiatal hernia    Headache    "at least 2/month" (01/16/2018)   Heart murmur    Hyperlipidemia    Hypertension    Memory change 05/14/2014   Migraine    "5-6/year"  (01/16/2018)   Obesity 01-2010   Obstructive sleep apnea    "can't wear machine; I have claustrophobia" (01/16/2018), states she had a 2nd sleep study and does not have sleep apnea, her O2 decreases and now is on 2 L of O2 at night.   On home oxygen therapy    "2L at night and prn during daytime" (01/16/2018)   Osteoarthritis    "knees and hands" (01/16/2018)   Peripheral vascular disease ([place])    ? numbness, tingling arms and legs   PONV (postoperative nausea and vomiting)    Stroke ([place])     2014, 2015, 2016   Type II diabetes mellitus ([place])    Ventral hernia    hx of    Past Surgical History:  Procedure Laterality Date   ANKLE FRACTURE SURGERY Left 1970's   APPENDECTOMY  1970's   w/hysterectomy   BASAL CELL CARCINOMA EXCISION Left 05/2014   "shoulder" (01/16/2018)   CARDIAC CATHETERIZATION  10/10/2012   Dr [name].   CARDIAC CATHETERIZATION N/A 05/29/2015   Procedure: Right/Left Heart Cath and Coronary/Graft Angiography;  Surgeon: Larey Dresser, MD;  Location: [place] CV LAB;  Service: Cardiovascular;  Laterality: N/A;   CAROTID ENDARTERECTOMY Left 03/2013   CAROTID STENT INSERTION Left 03/20/2013   Procedure: CAROTID STENT INSERTION;  Surgeon: [name], MD;  Location: [facility] CATH LAB;  Service: Cardiovascular;  Laterality: Left;  internal carotid   CEREBRAL ANGIOGRAM N/A 04/05/2011   Procedure: CEREBRAL ANGIOGRAM;  Surgeon: Angelia Mould, MD;  Location: [facility] CATH LAB;  Service: Cardiovascular;  Laterality: N/A;   CHOLECYSTECTOMY OPEN  2004   CORONARY ANGIOPLASTY WITH STENT PLACEMENT  01,02,05,06,07,08,11; 04/24/2013   "I've probably got ~ 10 stents by now" (04/24/2013)   CORONARY ANGIOPLASTY WITH STENT PLACEMENT  06/13/2013   "got 4 stents today" (06/13/2013)   CORONARY ARTERY BYPASS GRAFT  1220/11   "CABG X5"   CORONARY STENT INTERVENTION N/A 10/20/2017   Procedure: CORONARY STENT INTERVENTION;  Surgeon: Troy Sine, MD;  Location: [place] CV LAB;  Service: Cardiovascular;  Laterality: N/A;   ESOPHAGOGASTRODUODENOSCOPY  08/03/2011   Procedure: ESOPHAGOGASTRODUODENOSCOPY (EGD);  Surgeon: Shann Medal, MD;  Location[facility];  Service: General;  Laterality: N/A;   ESOPHAGOGASTRODUODENOSCOPY (EGD) WITH PROPOFOL N/A 03/11/2014   Procedure: ESOPHAGOGASTRODUODENOSCOPY (EGD) WITH PROPOFOL;  Surgeon: Lafayette Dragon, MD;  Location: [facility] ENDOSCOPY;  Service: Endoscopy;  Laterality: N/A;   FRACTURE SURGERY     gall stone removal  05/2003   GASTRIC RESTRICTION  SURGERY  1984   "stapeling"   HERNIA REPAIR  2004   "in my stomach; had OR on it twice", wire mesh on 1 hernia   LEFT HEART CATH AND CORS/GRAFTS ANGIOGRAPHY N/A 07/09/2016   Procedure: LEFT HEART CATH AND CORS/GRAFTS ANGIOGRAPHY;  Surgeon: Larey Dresser, MD;  Location: [place] CV LAB;  Service: Cardiovascular;  Laterality: N/A;   ORIF ANKLE FRACTURE Left 05/16/2018   ORIF ANKLE FRACTURE Left 05/16/2018   Procedure: OPEN REDUCTION INTERNAL FIXATION (ORIF) Left ankle with possible syndesmosis fixation;  Surgeon: Nicholes Stairs, MD;  Location: [place];  Service: Orthopedics;  Laterality: Left;  135mn   OVARY SURGERY  1970's   "tumor removed"   PERCUTANEOUS CORONARY STENT INTERVENTION (PCI-S) N/A 06/13/2013   Procedure: PERCUTANEOUS CORONARY STENT INTERVENTION (PCI-S);  Surgeon: JJettie Booze MD;  [facility];  Service: Cardiovascular;  Laterality: N/A;   PERCUTANEOUS STENT INTERVENTION N/A 04/24/2013   Procedure: PERCUTANEOUS STENT INTERVENTION;  Surgeon: JJettie Booze MD;  [facility];  Service: Cardiovascular;  Laterality: N/A;   RIGHT/LEFT HEART CATH AND CORONARY/GRAFT ANGIOGRAPHY N/A 10/20/2017   Procedure: RIGHT/LEFT HEART CATH AND CORONARY/GRAFT ANGIOGRAPHY;  Surgeon: MLarey Dresser MD;  [facility];  Service: Cardiovascular;  Laterality: N/A;   ROOT CANAL  10/2000   TEE WITHOUT CARDIOVERSION N/A 01/18/2019   Procedure: TRANSESOPHAGEAL ECHOCARDIOGRAM (TEE);  Surgeon: MLarey Dresser MD;  [facility];  Service: Cardiovascular;  Laterality: N/A;   TIBIA FRACTURE SURGERY Right 1970's   rods and pins   TOOTH EXTRACTION     "1 on the upper; wisdom tooth on the lower" (01/16/2018)   TOTAL ABDOMINAL HYSTERECTOMY  1970's   w/ appendectomy    There were no vitals filed for this visit.   Subjective Assessment - 04/01/21 1144     Subjective No falls, having a heart catheter march 20 th    Currently in Pain? Yes    Pain  Score 4     Pain Location Back                               [facility] Adult PT Treatment/Exercise - 04/01/21 0001       Lumbar Exercises: Aerobic   Nustep L 5 8 min      Lumbar Exercises: Standing   Row Strengthening;Both;Theraband;15 reps   x2   Theraband Level (Row) Level 4 (Blue)    Shoulder Extension Strengthening;Both;Theraband;20 reps    Theraband Level (Shoulder Extension) Level 3 (Green)      Lumbar Exercises: Seated   Long Arc Quad on Chair Strengthening;Both;2 sets;10 reps;Weights    LAQ on Chair Weights (lbs) 5  Sit to Stand 10 reps   x2   Other Seated Lumbar Exercises OHP yellow ball 2x10    Other Seated Lumbar Exercises yellow ball slams 2x10, Ab set Pball 2x10      Knee/Hip Exercises: Seated   Marching Strengthening;Both;2 sets;10 reps    Marching Weights 5 lbs.                       PT Short Term Goals - 01/06/21 1228       PT SHORT TERM GOAL #1   Title Patient to be independent with initial HEP.    Status Achieved               PT Long Term Goals - 04/01/21 1224       PT LONG TERM GOAL #2   Title Patient to demonstrate B LE strength >/=4+/5.    Status Partially Met      PT LONG TERM GOAL #3   Title Patient to score <14 sec on TUG testing in order to decrease risk of falls.    Status Partially Met      PT LONG TERM GOAL #4   Title Patient to score <13 sec on 5xSTS in order to decrease risk of falls.    Status Partially Met                   Plan - 04/01/21 1224     Clinical Impression Statement pt reports that she well be having an upcoming procedure on her heart. Time given between each interventions to rest. no reports of increase pain. L knee did buckle once with sit to stands. Cues for core engagement needed with seated OHP and standing rows. Cues to hold quad contraction with LAQ.    Personal Factors and Comorbidities Age;Comorbidity 2    Examination-Activity Limitations Locomotion  Level;Stairs    Examination-Participation Restrictions Occupation;Community Activity    Stability/Clinical Decision Making Evolving/Moderate complexity    PT Frequency 1x / week    PT Duration 4 weeks    PT Treatment/Interventions ADLs/Self Care Home Management;Cryotherapy;Electrical Stimulation;Iontophoresis 73m/ml Dexamethasone;Moist Heat;Gait training;Stair training;Functional mobility training;Therapeutic activities;Therapeutic exercise;Balance training;Neuromuscular re-education;Patient/family education;Manual techniques;Passive range of motion;Dry needling;Taping;Vasopneumatic Device;Joint Manipulations;Spinal Manipulations    PT Next Visit Plan progress strength, Recert?             Patient will benefit from skilled therapeutic intervention in order to improve the following deficits and impairments:  Decreased balance, Difficulty walking, Decreased activity tolerance, Decreased strength, Pain  Visit Diagnosis: Muscle weakness (generalized)  Unsteadiness on feet  Debility  Difficulty in walking, not elsewhere classified     Problem List Patient Active Problem List   Diagnosis Date Noted   Symptomatic bradycardia 01/16/2019   Normocytic anemia 01/16/2019   Diabetic peripheral neuropathy ([place] 01/12/2019   Neurologic abnormality 12/25/2018   Debility 09/21/2018   Hypotension 09/18/2018   Bradycardia 09/18/2018   AKI (acute kidney injury) ([place] 09/18/2018   Pressure injury of skin 09/18/2018   Ischemic cerebrovascular accident (CVA) of frontal lobe ([place] 09/13/2018   Fall 09/09/2018   History of CVA (cerebrovascular accident) 09/09/2018   Type II diabetes mellitus with renal manifestations ([place] 09/09/2018   CKD (chronic kidney disease), stage III 09/09/2018   Sepsis ([place] 09/09/2018   Depression with anxiety 09/09/2018   Slurred speech 09/09/2018   Ankle fracture, left 05/16/2018   Amiodarone pulmonary toxicity    Physical deconditioning    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 ([place]  Acute on chronic congestive heart failure (HCC)    Chronic combined systolic (congestive) and diastolic (congestive) heart failure ([place]) 01/65/8006   Chronic systolic CHF (congestive heart failure) ([place]) 01/16/2018   Atrial fibrillation ([place]) 10/21/2017   CAD (coronary artery disease) of bypass graft 10/20/2017   Coronary artery disease of bypass graft of native heart with stable angina pectoris (HCC)    Chronic low back pain 08/24/2016   Pain of left thumb 08/05/2016   Nocturnal hypoxia 06/02/2016   Hoarseness 04/05/2016   Laryngopharyngeal reflux (LPR) 04/05/2016   Pharyngoesophageal dysphagia 04/05/2016   Angina decubitus ([place]) 05/22/2015   Chronic insomnia 05/06/2015   Common migraine 05/14/2014   Abnormality of gait 05/14/2014   Memory change 05/14/2014   Aneurysm, cerebral, nonruptured 05/14/2014   Cramp of limb-Left neck 05/10/2014   Hematemesis with nausea    Vomiting blood    Dizziness and giddiness 09/21/2013   Atypical chest pain 07/18/2013   Unstable angina ([place]) 04/09/2013   Carotid artery stenosis, symptomatic 03/20/2013   Cerebrovascular disease 07/10/2012   Cerebral artery occlusion with cerebral infarction ([place]) 07/10/2012   Mitral regurgitation 04/15/2012   TIA (transient ischemic attack) 04/15/2012   Occlusion and stenosis of carotid artery without mention of cerebral infarction 08/18/2011   Bariatric surgery status 06/17/2011   Speech abnormality 03/22/2011   Dyspnea 02/24/2011   PAPILLARY MUSCLE DYSFUNCTION, NON-RHEUMATIC 10/09/2008   UNSPECIFIED VITAMIN D DEFICIENCY 10/24/2007   MYOCARDIAL INFARCTION, HX OF 10/24/2007   PERSISTENT VOMITING 10/24/2007   OSTEOARTHRITIS 10/24/2007   MIGRAINES, HX OF 10/24/2007   Diabetes mellitus type 2, controlled, with complications ([place]) 34/94/9447   Hyperlipemia 01/16/2007   Obesity-post failed open gastroplasty 1984  01/16/2007   OBSTRUCTIVE SLEEP APNEA 01/16/2007   Essential hypertension  01/16/2007   Coronary atherosclerosis 01/16/2007   Asthma 01/16/2007   GERD 01/16/2007   VENTRAL HERNIA 01/16/2007    Scot Jun, PTA 04/01/2021, 12:28 PM  [place]. [place], [place], [address] Phone: [phone]   Fax:  [phone]  Name: [name] [name] [name]: [id number] Date of Birth: [birth year]

## 2021-04-01 NOTE — Progress Notes (Signed)
Date:  04/01/2021   ID:  [name] [name], DOB 04/30/1945, [name] [id number]   Provider location:  Advanced Heart Failure Type of Visit: Established patient   PCP:  Derinda Late, MD  Cardiologist:  Dr. [name]  Chief Complaint: Exertional shortness of breath   History of Present Illness: [name] [name] is a 76 y.o. female who has a history of CAD s/p CABG, diastolic CHF, and cerebrovascular disease with history of CVA presents for followup of CHF and diastolic CHF. She had CABG x 5 in 12/11.  Prior to the CABG she had multiple PCIs.  She had a CVA in 2/14 that presented as visual loss.  She had a left carotid stent in 2/15.    Left heart cath done in Sept. 2014. This showed patent SVG-D, LIMA-LAD, and sequential SVG-OM branches but SVG-RCA and the native RCA were both totally occluded. It was felt aht her increased symptoms coincided with occlusion of SVG-RCA.  She was started on Imdur to see if this would help with the chest pain and dyspnea.  However, she feels like Imdur causes leg cramps and does not think that she can take it.  So ranolazine 500 mg bid started and titrated this up to 1000 mg bid.  This helped some but not markedly.  Therefore, sent to  Dr. Irish Lack to address opening RCA CTO. He was able to do this in 5/15 with 4 overlapping Xience DES.  This led to resolution of exertional chest pain.     [name] [name] was started on Brilinta after PCI.  She became much more short of breath after starting on Brilinta. Per Dr Delano Metz stopped and replaced with Plavix.  Dyspnea improved off Brilinta. Given increasing exertional dyspnea and chest pain,  RHC/LHC was done in 4/17.  This showed stable CAD with no interventional target.  Left and right heart filling pressures were not significantly increased.  Medical management.    She had an MRI/MRA in May 2018 that showed moderately severe stenosis of the supraclinoid segment of the right internal carotid artery, progressed since  the 2016 MR angiogram and severe basilar artery stenosis.    She developed recurrent exertional chest pain and had LHC in 6/18, showing 4/5 grafts patent with patent native RCA (similar to prior cath, no changes).     Echo 1/19 with EF 55-60%, moderate diastolic dysfunction, PASP 50 mmHg, mildly dilated RV, mild AS, mild-moderate MR.    She reported increased dyspnea and chest pain and had RHC/LHC in 9/19.  She had DES to sequential SVG-OM1/PLOM.  While hospitalized, she was noted to have runs of atrial fibrillation.  She was very symptomatic with the atrial fibrillation.  Eliquis and amiodarone were started.    She was admitted 12/16-12/19/19 for cardiomems implant and RHC. She was diuresed with IV lasix and transitioned to torsemide 100 mg BID + metolazone once/week. DC weight: 295 lbs.   She had a prolonged hospitalization in 2/20 with acute respiratory failure and fever to 103.  She was thought to have PNA and treated with cefepime.  She was intubated.  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  Amiodarone was stopped and she was put on prednisone. Echo in 2/20 showed EF down to 25-30% with mid-apical LV severe hypokinesis. She had AKI. Possible ITP triggered by infection, HIT negative.  ITP was treated with Solumedrol. She was discharged to SNF.    ORIF left ankle 4/20.   11/20  admission initially with concern for TIAs (staring spells), ended up thinking possible partial seizures.  Head MRI did not show CVA.  Echo in 11/20 showed EF 45-50%, moderately decreased RV systolic function, moderate RV enlargement, moderate-severe MR, moderate TR.   Patient was admitted again in 12/20 with AKI and hypotension in the setting of sinus bradycardia (HR 30s).  She was volume overloaded on exam.  Toprol XL was stopped and HR increased to 60s.  She was diuresed with IV Lasix.  TEE in 12/20 showed EF 50-55%, septal-lateral dyssynchrony, mildly decreased RV function, moderate MR.    Atypical chest pain in 1/21, Cardiolite showed EF 57%, fixed inferior defect (likely artifact), no ischemia.   Echo in 5/22 showed EF 60-65%, moderate LVH, normal RV, PASP 37 mmHg, 2.5 x 2.1 cm calcified mass posterior mitral annulus with mild-moderate MR => ?severe MAC but more circumscribed and prominent than in the past, mild AS mean gradient 12 mmHg and AVA 1.83 cm^2.  Echo in 11/22 showed EF 55%, normal RV, mass posterior mitral annulus, moderate MR, mild AS, IVC normal.  Cardiac MRI was done in 1/23 to investigate posterior mitral annulus mass; study showed LV EF 48%, mild LV dilation, normal RV with EF 53%, extensive MAC is likely the source of the posterior mitral annulus mass, basal-mid inferior >50% subendocardial LGE suggestive of prior inferior MI.   She returns today for followup of CHF and CAD.  Weight is stable. She has been having central chest tightness with ambulation for about 4 weeks.  This tends to happen daily and resolves with rest.  No chest pain at rest.  She also gets short of breath with exertion, more than in the past.  She will note the chest pain and dyspnea after walking about 100 feet. This has been concerning her, feels like prior ischemic symptoms.  She has been off torsemide, notes more leg swelling.  SBP running 130-140 at home.   ECG (personally reviewed): NSR, LBBB 170 msec, PVC  Cardiomems: PADP 12 (goal 10)  Labs (6/14): K 3.8, creatinine 0.71 Labs (8/14): BNP 249, LDL 166 Labs (9/14): K 4.7, creatinine 0.7 Labs (9/14): K 4.6, creatinine 0.9, BNP 109 Labs (1/15): K 4.5, creatinine 0.73, LDL 212, HCT 43.4, TSH normal, BNP 138 Labs (6/15): K 4.7, creatinine 1.17, LDL 100, HDL 37, TGs 363, BNP 39 Labs (10/15): K 4.6, creatinine 1.2 Labs (1/16): LDL 157, HDL 43, K 5.2, creatinine 0.95 Labs (2/16): K 4.5, creatinine 1.08, BNP 113 Labs (4/16): LDL 50, HDL 56, TGs 179 Labs (9/16): K 5.3, creatinine 1.09 Labs (10/16): LDL 75, HDL 56 Labs (2/17): K 5.2,  creatinine 1.07 Labs (4/17): K 5.1, creatinine 1.07 Labs (5/17): K 4.5, creatinine 1.01, LDL 96, HDL 56, BNP 70 Labs (03/2016) K 4.9, creatinine 1.07 Labs (7/18): K 4.9, creatinine 1.15 Labs (11/18): K 5.1, creatinine 1.11, LDL 36 Labs (1/19): K 4.9, creatinine 1.22 Labs (9/19): LDL 108 Labs (10/19): hgb 14.5, K 4.3, creatinine 1.03 Labs 12/06/2017: K 4.7 Creatinine 1.7 Labs (3/20): K 4.8, creatinine 1.39, hgb 12.4 plts 226 Labs (11/20): K 4.2, creatinine 1.01, LDL 38 Labs (12/20): K 3.5, creatinine 1.8 Labs (3/21): K 4.5, creatinine 1.5 Labs (2/22): K 4.2, creatinine 1.13 Labs (4/22): LDL 58 Labs (9/22): K 4.3, creatinine 1.27 Labs (12/22): K 4.4, creatinine 1.13   PMH: 1. Diabetic gastroparesis 2. Type II diabetes 3. HTN 4. Morbid obesity 5. CAD: s/p CABG in 12/11 after prior PCIs.  LIMA-LAD, SVG-D, seq SVG-OM1 and OM2, SVG-PDA.  Adenosine Cardiolite (8/14) with EF 53% and a small reversible apical defect with a medium, partially reversible inferior defect.  LHC (9/14) with patent SVG-D, LIMA-LAD (50% distal LAD), and sequential SVG-OM branches; the SVG-RCA and the native RCA were occluded.  This was managed medically initially, but with ongoing exertional chest pain, it was decided to open CTO.  Patient had CTO opening with 4 overlapping Xience DES in the RCA in 5/15.   - LHC (4/17): SVG-D patent, LIMA-LAD patent, distal LAD with several 40-50% stenoses, sequential SVG-OM1 and PLOM patent with 50% proximal stenosis (not flow limiting), SVG-RCA TO with patent RCA stents.  - LHC (6/18): 4/5 grafts patent (SVG-RCA TO, same as past), RCA stents patent => no change.  - LHC (9/19): Sequential SVG-PLOM/OM1 with 80% proximal stenosis, s/p DES.  - Cardiolite (1/21): EF 57%, fixed inferior defect (likely artifact), no ischemia.  6. Atypical migraines 7. OHS/OSA: Intolerant of CPAP. Uses oxygen with exertion and at night.  8. GERD with hiatal hernia 9. OA 10. HFPEF: She has a Cardiomems device.   Echo (11/13) with EF 50-55%, grade II diastolic dysfunction, mild-moderate MR.  Echo (8/14) with EF 55-60%, grade II diastolic dysfunction, mildly increased aortic valve gradient (mean 12 mmHg) but valve opens well, mild MR and mild RV dilation.  Echo (9/15) with EF 60-65%, grade II diastolic dysfunction, mild aortic stenosis, mild mitral stenosis, mild to moderate mitral regurgitation, mildly dilated RV with normal systolic function, PA systolic pressure 46 mmHg.  - RHC (4/17): mean RA 9, PA 38/15 mean 27, mean PCWP 9, CI 2.5.  - Echo (5/17): EF 55-60%, mild LVH, mildly dilated RV with low normal systolic function, moderate TR, PASP 61 mmHg - Echo (1/19): EF 55-60%, moderate diastolic dysfunction, PASP 50 mmHg, mildly dilated RV, mild AS, mild-moderate MR. - RHC (9/19): mean RA 11, PA 34/12, mean PCWP 15, CI 2.85 - Echo (3/20): EF 25-30% with mid-apical severe hypokinesis.  - Echo (11/20): EF 45-50%, moderately dilated RV with moderately decreased systolic function, moderate-severe MR, moderate TR.  - TEE (12/20): EF 50-55%, septal-lateral dyssynchrony, moderate RV dilation/mildly decreased function, peak RV-RA gradient 51 mmHg, moderate MR.  - Echo (5/22): EF 60-65%, moderate LVH, normal RV, PASP 37 mmHg, 2.5 x 2.1 cm calcified mass posterior mitral annulus with mild-moderate MR => ?severe MAC but more circumscribed and prominent than in the past, mild AS mean gradient 12 mmHg and AVA 1.83 cm^2.  - Echo (11/22): EF 55%, normal RV, mass posterior mitral annulus, moderate MR, mild AS, IVC normal. - Cardiac MRI in 1/23 showed LV EF 48%, mild LV dilation, normal RV with EF 53%, extensive MAC is likely the source of the posterior mitral annulus mass, basal-mid inferior >50% subendocardial LGE suggestive of prior inferior MI.  11. CKD 12. Chronic LBBB 13. Anxiety 14. Carotid stenosis: Followed by [facility], >13% LICA stenosis 08/65.  She had left carotid stent in 2/15. Carotid dopplers 8/15 with no significant  disease. Carotid dopplers (4/16): < 40% RICA stenosis.  - Carotid dopplers (9/22): 1-39% BICA stenosis.  15. Cerebrovascular disease: CVA 2/14 with right posterior cerebral artery territory ischemic infarction. Cerebral angiogram in 6/14 showed 70% right vertebral ostial stenosis, 78-46% LICA stenosis, > 96% proximal left posterior cerebral artery stenosis, posterior communicating artery aneurysm.  Patient has had episodes of transient expressive aphasia.  Carotid dopplers (29/52) showed >84% LICA stenosis. She had a left carotid stent 2/15.  Possible CVA in 7/15. -  MRI/MRA in May 2018 that showed moderately  severe stenosis of the supraclinoid segment of the right internal carotid artery, progressed since the 2016 MR angiogram and severe basilar artery stenosis.  16. Positional vertigo (suspected) 17. Palpitations: Holter (6/15) with rare PVCs/PACs.  - Event monitor (2/18): NSR, occasional PVCs 18. Dyspnea with Brilinta. 19. Melanoma: On face, s/p excision.   20. Aortic stenosis: Mild on 11/22 echo.  21. Holter (8/16) with no significant arrhythmias.  22. Lower extremity arterial dopplers (9/16) were normal.  23. Sleep study (4/18): No significant OSA.  24. Atrial fibrillation: Paroxysmal - Amiodarone stopped, ?lung toxicity.  25. H/o ITP in 3/20.  16. Partial seizures 17. Mitral regurgitation: Moderate on TEE in 12/20.  - Mild to moderate on 5/22 echo.  - Moderate on 11/22 echo 18. Posterior mitral annular mass: Cardiac MRI was done in 1/23 to investigate posterior mitral annulus mass; study showed LV EF 48%, mild LV dilation, normal RV with EF 53%, extensive MAC is likely the source of the posterior mitral annulus mass, basal-mid inferior >50% subendocardial LGE suggestive of prior inferior MI.   Current Outpatient Medications  Medication Sig Dispense Refill   acetaminophen (TYLENOL) 500 MG tablet Take 500 mg by mouth every 6 (six) hours as needed for mild pain, moderate pain, fever or  headache.     albuterol (VENTOLIN HFA) 108 (90 Base) MCG/ACT inhaler Inhale 2 puffs into the lungs every 4 (four) hours as needed for wheezing or shortness of breath.      Alirocumab (PRALUENT) 150 MG/ML SOAJ Inject 150 mg/mL into the skin every 14 (fourteen) days.     ALPRAZolam (XANAX) 0.5 MG tablet Take 1-2 tablets (0.5-1 mg total) by mouth See admin instructions. Take one tablet (0.5 mg) by mouth every morning and two tablets (1 mg) at night, may also take 1 tablet (0.5 mg) midday as needed for anxiety     amLODipine (NORVASC) 5 MG tablet TAKE ONE (1) TABLET BY MOUTH EACH DAY 90 tablet 3   apixaban (ELIQUIS) 5 MG TABS tablet Take 1 tablet (5 mg total) by mouth 2 (two) times daily. 180 tablet 1   B Complex-C (B-COMPLEX WITH VITAMIN C) tablet Take 1 tablet by mouth daily.     bisacodyl (DULCOLAX) 5 MG EC tablet Take 5 mg by mouth daily as needed for moderate constipation.     buPROPion (WELLBUTRIN XL) 150 MG 24 hr tablet Take 150 mg by mouth every morning.     cholecalciferol (VITAMIN D3) 25 MCG (1000 UNIT) tablet Take 1,000 Units by mouth daily.     clopidogrel (PLAVIX) 75 MG tablet TAKE ONE TABLET BY MOUTH AT BEDTIME 90 tablet 3   denosumab (PROLIA) 60 MG/ML SOSY injection Inject 60 mg into the skin every 6 (six) months.     DULoxetine (CYMBALTA) 30 MG capsule Take 1 capsule (30 mg total) by mouth daily. 30 capsule 0   ezetimibe (ZETIA) 10 MG tablet Take 1 tablet (10 mg total) by mouth at bedtime.     Fluticasone-Salmeterol (ADVAIR) 250-50 MCG/DOSE AEPB Inhale 1 puff into the lungs daily.     gabapentin (NEURONTIN) 600 MG tablet Take 600 mg by mouth 4 (four) times daily as needed.     Insulin Glargine (LANTUS SOLOSTAR) 100 UNIT/ML Solostar Pen Inject 38-44 Units into the skin See admin instructions. Inject 44 units subcutaneously daily at bedtime, increase to 44 units for CBG >200     insulin lispro (HUMALOG) 100 UNIT/ML KwikPen Inject 14-20 Units into the skin See admin instructions. Inject 14  units subcutaneously prior to breakfast and supper; add 4 units for CBG >200     ipratropium-albuterol (DUONEB) 0.5-2.5 (3) MG/3ML SOLN Take 3 mLs by nebulization 2 (two) times daily as needed (asthma).     methocarbamol (ROBAXIN) 500 MG tablet Take 500 mg by mouth 3 (three) times daily as needed.     Multiple Vitamin (MULTIVITAMIN WITH MINERALS) TABS tablet Take 1 tablet by mouth every morning. Centrum - Women over 55     Multiple Vitamins-Minerals (OCUVITE EYE HEALTH FORMULA) CAPS Take 1 capsule by mouth every morning.     nitroGLYCERIN (NITROSTAT) 0.3 MG SL tablet Place 1 tablet (0.3 mg total) under the tongue every 5 (five) minutes x 3 doses as needed for chest pain. 25 tablet 1   ondansetron (ZOFRAN-ODT) 4 MG disintegrating tablet Take 4 mg by mouth 2 (two) times daily as needed for nausea or vomiting.     OXYGEN Inhale 2 L into the lungs at bedtime as needed (shortness of breath).      pantoprazole (PROTONIX) 40 MG tablet Take 1 tablet (40 mg total) by mouth daily. 30 tablet 0   Polyvinyl Alcohol-Povidone (REFRESH OP) Place 1 drop into both eyes daily as needed (redness/ dry eyes).     potassium chloride SA (KLOR-CON) 20 MEQ tablet Take 1 tablet (20 mEq total) by mouth daily. 90 tablet 3   Pyridoxine HCl (VITAMIN B-6 PO) Take 1 tablet by mouth every morning.     ranolazine (RANEXA) 1000 MG SR tablet TAKE ONE TABLET BY MOUTH TWICE DAILY 180 tablet 3   rosuvastatin (CRESTOR) 40 MG tablet Take 1 tablet (40 mg total) by mouth at bedtime. 30 tablet 0   sacubitril-valsartan (ENTRESTO) 97-103 MG Take 1 tablet by mouth 2 (two) times daily. 180 tablet 3   spironolactone (ALDACTONE) 25 MG tablet TAKE 1 TABLET BY MOUTH DAILY 90 tablet 3   torsemide (DEMADEX) 20 MG tablet Take 0.5 tablets (10 mg total) by mouth every other day. 45 tablet 1   traZODone (DESYREL) 50 MG tablet Take 1 tablet (50 mg total) by mouth at bedtime. 90 tablet 1   vitamin B-12 (CYANOCOBALAMIN) 1000 MCG tablet Take 1,000 mcg by mouth  every morning.      zolpidem (AMBIEN) 10 MG tablet Take 10 mg by mouth at bedtime as needed for sleep.     No current facility-administered medications for this encounter.    Allergies:   Amoxicillin, Brilinta [ticagrelor], Erythromycin, Flagyl [metronidazole], Penicillins, Isosorbide mononitrate [isosorbide nitrate], Jardiance [empagliflozin], Metformin and related, Tape, and Erythromycin base   Social History:  The patient  reports that she has never smoked. She has never used smokeless tobacco. She reports that she does not drink alcohol and does not use drugs.   Family History:  The patient's family history includes AAA (abdominal aortic aneurysm) in her father and mother; Cancer in her sister; Deep vein thrombosis in her father and mother; Dementia in her maternal grandmother and mother; Diabetes in her father, paternal aunt, paternal grandmother, paternal uncle, and paternal uncle; Heart attack in her father; Heart attack (age of onset: 33) in her paternal grandfather; Heart disease in her father and paternal uncle; Hyperlipidemia in her father; Hypertension in her father, mother, and sister.   ROS:  Please see the history of present illness.   All other systems are personally reviewed and negative.   Exam:   BP 130/78    Pulse (!) 59    Wt 99.2 kg (218 lb 12.8 oz)  SpO2 98%    BMI 40.02 kg/m  General: NAD Neck: JVP 7-8 cm, no thyromegaly or thyroid nodule.  Lungs: Clear to auscultation bilaterally with normal respiratory effort. CV: Nondisplaced PMI.  Heart regular S1/S2 with paradoxically split S2, no S3/S4, 2/6 early SEM RUSB.  No peripheral edema.  No carotid bruit.  Normal pedal pulses.  Abdomen: Soft, nontender, no hepatosplenomegaly, no distention.  Skin: Intact without lesions or rashes.  Neurologic: Alert and oriented x 3.  Psych: Normal affect. Extremities: No clubbing or cyanosis.  HEENT: Normal.   Recent Labs: 03/31/2021: B Natriuretic Peptide 339.3; BUN 17;  Creatinine, Ser 0.89; Hemoglobin 15.7; Platelets 153; Potassium 4.2; Sodium 140  Personally reviewed   Wt Readings from Last 3 Encounters:  03/31/21 99.2 kg (218 lb 12.8 oz)  02/26/21 98.4 kg (217 lb)  12/29/20 100.2 kg (220 lb 12.8 oz)     ASSESSMENT AND PLAN:  1. CAD: Occluded native RCA and SVG-RCA.  Status post opening of CTO RCA with 4 overlapping Xience DES in 5/15.  DES to sequential SVG-OM1/PLOM in 9/19.  She has been on Plavix and Eliquis.  No ischemia on 1/21 Cardiolite.  For the last month, patient has been having typical angina after walking about 100 feet that resolves with rest, no CP at rest.  - Continuing on Plavix (neuro has wanted her to stay on this for cerebrovascular disease).   - She has not tolerated Imdur in the past due to headache. Off Toprol XL with bradycardia.  Continue ranolazine 1000 mg bid and amlodipine 5 mg daily (decreased due to orthostatic symptoms) for angina.  - We do not have good options for pushing her anti-anginal therapy.  Given new typical angina with mild to moderate exertion, I think she merits coronary angiography.  I will arrange.  I discussed risks/benefits with her and she agrees to procedure.  Hold Eliquis the day before and day of.  2. Chronic systolic => diastolic CHF: Echo in 5/40 with EF 55-60%, moderate diastolic dysfunction. Cardiomems placed 01/16/18.  Echo in 3/20 in setting of severe PNA with intubation showed EF 25-30%, mid-apical LV severe hypokinesis.  Possible stress (Takotsubo-type) cardiomyopathy related to severe medical illness versus progression of CAD.  She has baseline chronic LBBB which could play a role in cardiomyopathy as well (LBBB cardiomyopathy).  Repeat echo in 11/20 with EF up to 45-50%, moderately dysfunctional RV.  TEE in 12/20 with EF 50-55% with septal-lateral dyssynchrony and mildly dysfunctional RV.  Most recent echo in 11/22 with EF 55% with normal RV.  cMRI in 1/23 with EF 48% and evidence for prior inferior MI.   NYHA class III symptoms, worse recently.  Probably very mild volume overload by exam and by Cardiomems (just above goal PADP).  - Restart torsemide at 10 mg qod.  BMET/BNP today and again in 10 days.  - With prominent dyspnea, will perform RHC along with coronary angiography.  Discussed risks/benefits with patient and she agrees to procedure.  - Continue spironolactone 25 mg daily.  - Continue Entresto 97/103 bid.  - Would add SGLT2 inhibitor eventually.    3. Hyperlipidemia: She remains on Zetia, Crestor and Praluent. Check lipids today.  4. Hypertension: BP controlled.  5. Cerebrovascular disease: She has history of CVA and had left carotid stent in 2/15. Followed by [facility].  Most recent MRA head showed severe stenosis of supraclinoid RICA and severe basilar artery stenosis.  - Neurology would like her to continue Plavix for this despite concomitant apixaban use.  6. OHS/OSA: Cannot tolerate CPAP.  Uses oxygen at night.  7. Atrial fibrillation: Paroxysmal, no recent palpitations. 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  She is in NSR today.  - Continue apixaban 5 mg bid.  8. Obesity: Weight has trended down over the last year.  9. Mitral regurgitation: Moderate on 11/22 echo. .  10. Bradycardia: Now off Toprol XL.   11. Aortic stenosis: Mild on 11/22 echo.  12. Mitral annular mass: This is a well-circumscribed calcified posterior mitral annulus mass noted on 5/22 and 11/22 echoes.  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   Followup in 1 month with APP.   Signed, Loralie Champagne, MD  04/01/2021  [place] [address] [facility] [place] [address] [phone] (office) [phone] (fax)

## 2021-04-07 ENCOUNTER — Other Ambulatory Visit: Payer: Self-pay

## 2021-04-07 ENCOUNTER — Ambulatory Visit: Payer: Medicare Other | Admitting: Physical Therapy

## 2021-04-07 DIAGNOSIS — R262 Difficulty in walking, not elsewhere classified: Secondary | ICD-10-CM

## 2021-04-07 DIAGNOSIS — M6281 Muscle weakness (generalized): Secondary | ICD-10-CM

## 2021-04-07 DIAGNOSIS — R5381 Other malaise: Secondary | ICD-10-CM

## 2021-04-07 DIAGNOSIS — R2681 Unsteadiness on feet: Secondary | ICD-10-CM

## 2021-04-07 NOTE — Therapy (Signed)
[place] [place], [place], [address] Phone: [phone]   Fax:  [phone]  Physical Therapy Treatment  Patient Details  Name: [name] [name] [name]: [id number] Date of Birth: May 03, 1945 Referring Provider (PT): Dr Loralie Champagne   Encounter Date: 04/07/2021   PT End of Session - 04/07/21 1245     Visit Number 4    Date for PT Re-Evaluation 04/07/21    PT Start Time 1226    PT Stop Time 1315    PT Time Calculation (min) 49 min             Past Medical History:  Diagnosis Date   Anemia    hx   Anxiety    Asthma    Basal cell carcinoma 05/2014   "left shoulder"   Bundle branch block, left    chronic/notes 07/18/2013   CHF (congestive heart failure) ([place])    Chronic insomnia 05/06/2015   Chronic kidney disease    frequency, sees dr Jamal Maes every 4 to 6 months (01/16/2018)   Chronic lower back pain    Claustrophobia    Common migraine 05/14/2014   Coronary artery disease    MI in 2001, 2002, 2006, 2011, 2014   Depression    Diabetic peripheral neuropathy ([place]) 01/12/2019   GERD (gastroesophageal reflux disease)    H/O hiatal hernia    Headache    "at least 2/month" (01/16/2018)   Heart murmur    Hyperlipidemia    Hypertension    Memory change 05/14/2014   Migraine    "5-6/year"  (01/16/2018)   Obesity 01-2010   Obstructive sleep apnea    "can't wear machine; I have claustrophobia" (01/16/2018), states she had a 2nd sleep study and does not have sleep apnea, her O2 decreases and now is on 2 L of O2 at night.   On home oxygen therapy    "2L at night and prn during daytime" (01/16/2018)   Osteoarthritis    "knees and hands" (01/16/2018)   Peripheral vascular disease ([place])    ? numbness, tingling arms and legs   PONV (postoperative nausea and vomiting)    Stroke ([place])    2014, 2015, 2016   Type II diabetes mellitus ([place])    Ventral hernia    hx of    Past Surgical History:   Procedure Laterality Date   ANKLE FRACTURE SURGERY Left 1970's   APPENDECTOMY  1970's   w/hysterectomy   BASAL CELL CARCINOMA EXCISION Left 05/2014   "shoulder" (01/16/2018)   CARDIAC CATHETERIZATION  10/10/2012   Dr [name].   CARDIAC CATHETERIZATION N/A 05/29/2015   Procedure: Right/Left Heart Cath and Coronary/Graft Angiography;  Surgeon: Larey Dresser, MD;  Location: [facility];  Service: Cardiovascular;  Laterality: N/A;   CAROTID ENDARTERECTOMY Left 03/2013   CAROTID STENT INSERTION Left 03/20/2013   Procedure: CAROTID STENT INSERTION;  Surgeon: [name], MD;  Location: [facility] CATH LAB;  Service: Cardiovascular;  Laterality: Left;  internal carotid   CEREBRAL ANGIOGRAM N/A 04/05/2011   Procedure: CEREBRAL ANGIOGRAM;  Surgeon: Angelia Mould, MD;  Location: [facility] CATH LAB;  Service: Cardiovascular;  Laterality: N/A;   CHOLECYSTECTOMY OPEN  2004   CORONARY ANGIOPLASTY WITH STENT PLACEMENT  01,02,05,06,07,08,11; 04/24/2013   "I've probably got ~ 10 stents by now" (04/24/2013)   CORONARY ANGIOPLASTY WITH STENT PLACEMENT  06/13/2013   "got 4 stents today" (06/13/2013)   CORONARY ARTERY BYPASS GRAFT  1220/11   "CABG  X5"   CORONARY STENT INTERVENTION N/A 10/20/2017   Procedure: CORONARY STENT INTERVENTION;  Surgeon: Troy Sine, MD;  Location: [facility];  Service: Cardiovascular;  Laterality: N/A;   ESOPHAGOGASTRODUODENOSCOPY  08/03/2011   Procedure: ESOPHAGOGASTRODUODENOSCOPY (EGD);  Surgeon: Shann Medal, MD;  Location: [facility];  Service: General;  Laterality: N/A;   ESOPHAGOGASTRODUODENOSCOPY (EGD) WITH PROPOFOL N/A 03/11/2014   Procedure: ESOPHAGOGASTRODUODENOSCOPY (EGD) WITH PROPOFOL;  Surgeon: Lafayette Dragon, MD;  Location: [facility];  Service: Endoscopy;  Laterality: N/A;   FRACTURE SURGERY     gall stone removal  05/2003   GASTRIC RESTRICTION SURGERY  1984   "stapeling"   HERNIA REPAIR  2004   "in my stomach; had OR on it twice", wire mesh on 1 hernia    LEFT HEART CATH AND CORS/GRAFTS ANGIOGRAPHY N/A 07/09/2016   Procedure: LEFT HEART CATH AND CORS/GRAFTS ANGIOGRAPHY;  Surgeon: Larey Dresser, MD;  Location: [facility];  Service: Cardiovascular;  Laterality: N/A;   ORIF ANKLE FRACTURE Left 05/16/2018   ORIF ANKLE FRACTURE Left 05/16/2018   Procedure: OPEN REDUCTION INTERNAL FIXATION (ORIF) Left ankle with possible syndesmosis fixation;  Surgeon: Nicholes Stairs, MD;  Location: [facility];  Service: Orthopedics;  Laterality: Left;  139mn   OVARY SURGERY  1970's   "tumor removed"   PERCUTANEOUS CORONARY STENT INTERVENTION (PCI-S) N/A 06/13/2013   Procedure: PERCUTANEOUS CORONARY STENT INTERVENTION (PCI-S);  Surgeon: JJettie Booze MD;  [facility];  Service: Cardiovascular;  Laterality: N/A;   PERCUTANEOUS STENT INTERVENTION N/A 04/24/2013   Procedure: PERCUTANEOUS STENT INTERVENTION;  Surgeon: JJettie Booze MD;  [facility];  Service: Cardiovascular;  Laterality: N/A;   RIGHT/LEFT HEART CATH AND CORONARY/GRAFT ANGIOGRAPHY N/A 10/20/2017   Procedure: RIGHT/LEFT HEART CATH AND CORONARY/GRAFT ANGIOGRAPHY;  Surgeon: MLarey Dresser MD;  [facility];  Service: Cardiovascular;  Laterality: N/A;   ROOT CANAL  10/2000   TEE WITHOUT CARDIOVERSION N/A 01/18/2019   Procedure: TRANSESOPHAGEAL ECHOCARDIOGRAM (TEE);  Surgeon: MLarey Dresser MD;  [facility];  Service: Cardiovascular;  Laterality: N/A;   TIBIA FRACTURE SURGERY Right 1970's   rods and pins   TOOTH EXTRACTION     "1 on the upper; wisdom tooth on the lower" (01/16/2018)   TOTAL ABDOMINAL HYSTERECTOMY  1970's   w/ appendectomy    There were no vitals filed for this visit.   Subjective Assessment - 04/07/21 1232     Subjective between heart and back not great. heart cath 3/20- not sure what they will find. tests also showed small stoke/TIA    Currently in Pain? Yes    Pain Score 5     Pain Location Back                                [facility] Adult PT Treatment/Exercise - 04/07/21 0001       Lumbar Exercises: Aerobic   Nustep L 5 8 min      Lumbar Exercises: Seated   Other Seated Lumbar Exercises trunk flex and ext 15 x tband    Other Seated Lumbar Exercises row and shld ext 2 sets 10 green tband      Knee/Hip Exercises: Machines for Strengthening   Cybex Knee Extension 10# 2 sets 12    Cybex Knee Flexion 25# 2 sets 12  PT Short Term Goals - 01/06/21 1228       PT SHORT TERM GOAL #1   Title Patient to be independent with initial HEP.    Status Achieved               PT Long Term Goals - 04/07/21 1241       PT LONG TERM GOAL #1   Title Patient to be independent with advanced HEP.    Status Achieved      PT LONG TERM GOAL #2   Title Patient to demonstrate B LE strength >/=4+/5.    Baseline grossly 4/5 .pt also fatigues quickly    Status Partially Met      PT LONG TERM GOAL #3   Title Patient to score <14 sec on TUG testing in order to decrease risk of falls.    Baseline with RW 12.74    Status Achieved      PT LONG TERM GOAL #4   Title Patient to score <13 sec on 5xSTS in order to decrease risk of falls.    Baseline from mat without UE 15 sec    Status Achieved      PT LONG TERM GOAL #5   Title Patient to report tolerance for 15 min of walking with LRAD without fatigue limiting.    Baseline 5-6 min per pt report - SOB and LBP    Status Partially Met                   Plan - 04/07/21 1238     Clinical Impression Statement pt arrived with back pain and increased SOB- heart cath scheduled 3/20 with multi issues. LTGs partially met- discussed need to D/C today vs renew and get through procedures and then get new script for PT    PT Next Visit Plan D/C- rec new script once cleared from heart procedure             Patient will benefit from skilled therapeutic intervention in order to improve the following  deficits and impairments:     Visit Diagnosis: Muscle weakness (generalized)  Unsteadiness on feet  Debility  Difficulty in walking, not elsewhere classified     Problem List Patient Active Problem List   Diagnosis Date Noted   Symptomatic bradycardia 01/16/2019   Normocytic anemia 01/16/2019   Diabetic peripheral neuropathy ([place]) 01/12/2019   Neurologic abnormality 12/25/2018   Debility 09/21/2018   Hypotension 09/18/2018   Bradycardia 09/18/2018   AKI (acute kidney injury) ([place]) 09/18/2018   Pressure injury of skin 09/18/2018   Ischemic cerebrovascular accident (CVA) of frontal lobe ([place]) 09/13/2018   Fall 09/09/2018   History of CVA (cerebrovascular accident) 09/09/2018   Type II diabetes mellitus with renal manifestations ([place]) 09/09/2018   CKD (chronic kidney disease), stage III 09/09/2018   Sepsis ([place]) 09/09/2018   Depression with anxiety 09/09/2018   Slurred speech 09/09/2018   Ankle fracture, left 05/16/2018   Amiodarone pulmonary toxicity    Physical deconditioning    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[place])    Acute on chronic congestive heart failure ([facility])    Chronic combined systolic (congestive) and diastolic (congestive) heart failure ([place]) 60/63/0160   Chronic systolic CHF (congestive heart failure) ([place]) 01/16/2018   Atrial fibrillation ([place]) 10/21/2017   CAD (coronary artery disease) of bypass graft 10/20/2017   Coronary artery disease of bypass graft of native heart with stable angina pectoris ([place])    Chronic low back pain 08/24/2016   Pain  of left thumb 08/05/2016   Nocturnal hypoxia 06/02/2016   Hoarseness 04/05/2016   Laryngopharyngeal reflux (LPR) 04/05/2016   Pharyngoesophageal dysphagia 04/05/2016   Angina decubitus ([place]) 05/22/2015   Chronic insomnia 05/06/2015   Common migraine 05/14/2014   Abnormality of gait 05/14/2014   Memory change 05/14/2014   Aneurysm, cerebral, nonruptured 05/14/2014   Cramp of limb-Left neck 05/10/2014    Hematemesis with nausea    Vomiting blood    Dizziness and giddiness 09/21/2013   Atypical chest pain 07/18/2013   Unstable angina ([place]) 04/09/2013   Carotid artery stenosis, symptomatic 03/20/2013   Cerebrovascular disease 07/10/2012   Cerebral artery occlusion with cerebral infarction ([place]) 07/10/2012   Mitral regurgitation 04/15/2012   TIA (transient ischemic attack) 04/15/2012   Occlusion and stenosis of carotid artery without mention of cerebral infarction 08/18/2011   Bariatric surgery status 06/17/2011   Speech abnormality 03/22/2011   Dyspnea 02/24/2011   PAPILLARY MUSCLE DYSFUNCTION, NON-RHEUMATIC 10/09/2008   UNSPECIFIED VITAMIN D DEFICIENCY 10/24/2007   MYOCARDIAL INFARCTION, HX OF 10/24/2007   PERSISTENT VOMITING 10/24/2007   OSTEOARTHRITIS 10/24/2007   MIGRAINES, HX OF 10/24/2007   Diabetes mellitus type 2, controlled, with complications ([place]) 12/75/1700   Hyperlipemia 01/16/2007   Obesity-post failed open gastroplasty 1984  01/16/2007   OBSTRUCTIVE SLEEP APNEA 01/16/2007   Essential hypertension 01/16/2007   Coronary atherosclerosis 01/16/2007   Asthma 01/16/2007   GERD 01/16/2007   VENTRAL HERNIA 01/16/2007   PHYSICAL THERAPY DISCHARGE SUMMARY   Patient agrees to discharge. Patient goals were partially met. Patient is being discharged due to a change in medical status.  Cerenity Goshorn,ANGIE, PTA 04/07/2021, 1:03 PM  [place] [place], [place], [address] Phone: [phone]   Fax:  [phone]  Name: CAIYA BETTES [name]: [id number] Date of Birth: 1945/05/21

## 2021-04-14 ENCOUNTER — Other Ambulatory Visit: Payer: Self-pay

## 2021-04-14 ENCOUNTER — Ambulatory Visit (HOSPITAL_COMMUNITY)
Admission: RE | Admit: 2021-04-14 | Discharge: 2021-04-14 | Disposition: A | Discharge status: Home / Self Care | Payer: Medicare Other | Source: Ambulatory Visit | Attending: Internal Medicine | Admitting: Internal Medicine

## 2021-04-14 DIAGNOSIS — I5022 Chronic systolic (congestive) heart failure: Secondary | ICD-10-CM

## 2021-04-14 LAB — BASIC METABOLIC PANEL
Anion gap: 5 (ref 5–15)
BUN: 18 mg/dL (ref 8–23)
CO2: 27 mmol/L (ref 22–32)
Calcium: 8.8 mg/dL — ABNORMAL LOW (ref 8.9–10.3)
Chloride: 108 mmol/L (ref 98–111)
Creatinine, Ser: 0.98 mg/dL (ref 0.44–1.00)
GFR, Estimated: >60 mL/min (ref >60)
Glucose, Bld: 142 mg/dL — ABNORMAL HIGH (ref 70–99)
Potassium: 4.7 mmol/L (ref 3.5–5.1)
Sodium: 140 mmol/L (ref 135–145)

## 2021-04-20 ENCOUNTER — Observation Stay (HOSPITAL_COMMUNITY)
Admission: RE | Admit: 2021-04-20 | Discharge: 2021-04-21 | Disposition: A | Discharge status: Home / Self Care | Payer: Medicare Other | Attending: Cardiology | Admitting: Cardiology

## 2021-04-20 ENCOUNTER — Ambulatory Visit (HOSPITAL_COMMUNITY)
Admission: RE | Disposition: A | Discharge status: Home / Self Care | Payer: Self-pay | Source: Home / Self Care | Attending: Cardiology

## 2021-04-20 ENCOUNTER — Telehealth: Payer: Self-pay | Admitting: Internal Medicine

## 2021-04-20 ENCOUNTER — Other Ambulatory Visit: Payer: Self-pay

## 2021-04-20 DIAGNOSIS — I130 Hypertensive heart and chronic kidney disease with heart failure and stage 1 through stage 4 chronic kidney disease, or unspecified chronic kidney disease: Secondary | ICD-10-CM | POA: Diagnosis not present

## 2021-04-20 DIAGNOSIS — I480 Paroxysmal atrial fibrillation: Secondary | ICD-10-CM | POA: Insufficient documentation

## 2021-04-20 DIAGNOSIS — I080 Rheumatic disorders of both mitral and aortic valves: Secondary | ICD-10-CM | POA: Diagnosis not present

## 2021-04-20 DIAGNOSIS — I25118 Atherosclerotic heart disease of native coronary artery with other forms of angina pectoris: Secondary | ICD-10-CM | POA: Diagnosis not present

## 2021-04-20 DIAGNOSIS — Z7901 Long term (current) use of anticoagulants: Secondary | ICD-10-CM | POA: Diagnosis not present

## 2021-04-20 DIAGNOSIS — I25110 Atherosclerotic heart disease of native coronary artery with unstable angina pectoris: Principal | ICD-10-CM | POA: Insufficient documentation

## 2021-04-20 DIAGNOSIS — I209 Angina pectoris, unspecified: Secondary | ICD-10-CM | POA: Diagnosis present

## 2021-04-20 DIAGNOSIS — I5042 Chronic combined systolic (congestive) and diastolic (congestive) heart failure: Secondary | ICD-10-CM | POA: Insufficient documentation

## 2021-04-20 DIAGNOSIS — Z951 Presence of aortocoronary bypass graft: Secondary | ICD-10-CM | POA: Diagnosis not present

## 2021-04-20 DIAGNOSIS — J45909 Unspecified asthma, uncomplicated: Secondary | ICD-10-CM | POA: Diagnosis not present

## 2021-04-20 DIAGNOSIS — Z794 Long term (current) use of insulin: Secondary | ICD-10-CM | POA: Diagnosis not present

## 2021-04-20 DIAGNOSIS — Z9861 Coronary angioplasty status: Secondary | ICD-10-CM

## 2021-04-20 DIAGNOSIS — I25119 Atherosclerotic heart disease of native coronary artery with unspecified angina pectoris: Secondary | ICD-10-CM | POA: Diagnosis present

## 2021-04-20 DIAGNOSIS — Z79899 Other long term (current) drug therapy: Secondary | ICD-10-CM | POA: Insufficient documentation

## 2021-04-20 DIAGNOSIS — Z955 Presence of coronary angioplasty implant and graft: Secondary | ICD-10-CM | POA: Insufficient documentation

## 2021-04-20 DIAGNOSIS — N189 Chronic kidney disease, unspecified: Secondary | ICD-10-CM | POA: Insufficient documentation

## 2021-04-20 DIAGNOSIS — I5022 Chronic systolic (congestive) heart failure: Secondary | ICD-10-CM

## 2021-04-20 DIAGNOSIS — Z85828 Personal history of other malignant neoplasm of skin: Secondary | ICD-10-CM | POA: Diagnosis not present

## 2021-04-20 DIAGNOSIS — I200 Unstable angina: Secondary | ICD-10-CM | POA: Diagnosis not present

## 2021-04-20 DIAGNOSIS — I2510 Atherosclerotic heart disease of native coronary artery without angina pectoris: Secondary | ICD-10-CM | POA: Diagnosis present

## 2021-04-20 DIAGNOSIS — Z7902 Long term (current) use of antithrombotics/antiplatelets: Secondary | ICD-10-CM | POA: Diagnosis not present

## 2021-04-20 DIAGNOSIS — Z8673 Personal history of transient ischemic attack (TIA), and cerebral infarction without residual deficits: Secondary | ICD-10-CM | POA: Insufficient documentation

## 2021-04-20 HISTORY — PX: CORONARY STENT INTERVENTION: CATH118234

## 2021-04-20 HISTORY — PX: RIGHT/LEFT HEART CATH AND CORONARY/GRAFT ANGIOGRAPHY: CATH118267

## 2021-04-20 HISTORY — PX: CORONARY BALLOON ANGIOPLASTY: CATH118233

## 2021-04-20 LAB — POCT I-STAT EG7
Acid-Base Excess: 1 mmol/L (ref 0.0–2.0)
Acid-Base Excess: 1 mmol/L (ref 0.0–2.0)
Bicarbonate: 27.2 mmol/L (ref 20.0–28.0)
Bicarbonate: 27.5 mmol/L (ref 20.0–28.0)
Calcium, Ion: 1.24 mmol/L (ref 1.15–1.40)
Calcium, Ion: 1.31 mmol/L (ref 1.15–1.40)
HCT: 36 % (ref 36.0–46.0)
HCT: 37 % (ref 36.0–46.0)
Hemoglobin: 12.2 g/dL (ref 12.0–15.0)
Hemoglobin: 12.6 g/dL (ref 12.0–15.0)
O2 Saturation: 79 %
O2 Saturation: 80 %
Potassium: 4.1 mmol/L (ref 3.5–5.1)
Potassium: 4.3 mmol/L (ref 3.5–5.1)
Sodium: 142 mmol/L (ref 135–145)
Sodium: 142 mmol/L (ref 135–145)
TCO2: 29 mmol/L (ref 22–32)
TCO2: 29 mmol/L (ref 22–32)
pCO2, Ven: 50.8 mmHg (ref 44–60)
pCO2, Ven: 51.9 mmHg (ref 44–60)
pH, Ven: 7.332 (ref 7.25–7.43)
pH, Ven: 7.337 (ref 7.25–7.43)
pO2, Ven: 47 mmHg — ABNORMAL HIGH (ref 32–45)
pO2, Ven: 48 mmHg — ABNORMAL HIGH (ref 32–45)

## 2021-04-20 LAB — BASIC METABOLIC PANEL
Anion gap: 11 (ref 5–15)
BUN: 29 mg/dL — ABNORMAL HIGH (ref 8–23)
CO2: 26 mmol/L (ref 22–32)
Calcium: 9.9 mg/dL (ref 8.9–10.3)
Chloride: 105 mmol/L (ref 98–111)
Creatinine, Ser: 1.19 mg/dL — ABNORMAL HIGH (ref 0.44–1.00)
GFR, Estimated: 48 mL/min — ABNORMAL LOW (ref >60)
Glucose, Bld: 142 mg/dL — ABNORMAL HIGH (ref 70–99)
Potassium: 4.4 mmol/L (ref 3.5–5.1)
Sodium: 142 mmol/L (ref 135–145)

## 2021-04-20 LAB — POCT ACTIVATED CLOTTING TIME
Activated Clotting Time: 323 seconds
Activated Clotting Time: 245 seconds
Activated Clotting Time: 209 seconds
Activated Clotting Time: 191 seconds
Activated Clotting Time: 161 seconds

## 2021-04-20 LAB — GLUCOSE, CAPILLARY
Glucose-Capillary: 133 mg/dL — ABNORMAL HIGH (ref 70–99)
Glucose-Capillary: 135 mg/dL — ABNORMAL HIGH (ref 70–99)
Glucose-Capillary: 112 mg/dL — ABNORMAL HIGH (ref 70–99)

## 2021-04-20 SURGERY — CORONARY BALLOON ANGIOPLASTY
Anesthesia: LOCAL

## 2021-04-20 MED ORDER — LIDOCAINE HCL (PF) 1 % IJ SOLN
INTRAMUSCULAR | Status: AC
  Filled 2021-04-20: qty 30

## 2021-04-20 MED ORDER — HEPARIN (PORCINE) IN NACL 1000-0.9 UT/500ML-% IV SOLN
INTRAVENOUS | Status: AC
  Filled 2021-04-20: qty 1000

## 2021-04-20 MED ORDER — SODIUM CHLORIDE 0.9 % IV SOLN: 250.0000 mL | INTRAVENOUS | Status: DC | PRN

## 2021-04-20 MED ORDER — PANTOPRAZOLE SODIUM 40 MG PO TBEC
40.0000 mg | DELAYED_RELEASE_TABLET | Freq: Every day | ORAL | Status: DC
  Administered 2021-04-21: 40 mg via ORAL
  Filled 2021-04-20 (×2): qty 1

## 2021-04-20 MED ORDER — CLOPIDOGREL BISULFATE 75 MG PO TABS
75.0000 mg | ORAL_TABLET | Freq: Once | ORAL | Status: AC
  Administered 2021-04-20: 75 mg via ORAL

## 2021-04-20 MED ORDER — HYDRALAZINE HCL 20 MG/ML IJ SOLN: 10.0000 mg | INTRAMUSCULAR | Status: AC | PRN

## 2021-04-20 MED ORDER — CLOPIDOGREL BISULFATE 75 MG PO TABS
ORAL_TABLET | ORAL | Status: AC
  Filled 2021-04-20: qty 1

## 2021-04-20 MED ORDER — INSULIN LISPRO (1 UNIT DIAL) 100 UNIT/ML (KWIKPEN)
14.0000 [IU] | PEN_INJECTOR | SUBCUTANEOUS | Status: DC
  Filled 2021-04-20: qty 3

## 2021-04-20 MED ORDER — NITROGLYCERIN 1 MG/10 ML FOR IR/CATH LAB
INTRA_ARTERIAL | Status: AC
  Filled 2021-04-20: qty 10

## 2021-04-20 MED ORDER — MIDAZOLAM HCL 2 MG/2ML IJ SOLN
INTRAMUSCULAR | Status: DC | PRN
  Administered 2021-04-20: 1 mg via INTRAVENOUS

## 2021-04-20 MED ORDER — VERAPAMIL HCL 2.5 MG/ML IV SOLN
INTRAVENOUS | Status: AC
  Filled 2021-04-20: qty 2

## 2021-04-20 MED ORDER — ASPIRIN 81 MG PO CHEW
81.0000 mg | CHEWABLE_TABLET | Freq: Every day | ORAL | Status: DC
  Administered 2021-04-21: 81 mg via ORAL
  Filled 2021-04-20: qty 1

## 2021-04-20 MED ORDER — APIXABAN 5 MG PO TABS
5.0000 mg | ORAL_TABLET | Freq: Two times a day (BID) | ORAL | Status: DC
  Administered 2021-04-21: 5 mg via ORAL
  Filled 2021-04-20: qty 1

## 2021-04-20 MED ORDER — SODIUM CHLORIDE 0.9% FLUSH
3.0000 mL | Freq: Two times a day (BID) | INTRAVENOUS | Status: DC
  Administered 2021-04-21: 3 mL via INTRAVENOUS

## 2021-04-20 MED ORDER — MIDAZOLAM HCL 2 MG/2ML IJ SOLN
INTRAMUSCULAR | Status: AC
  Filled 2021-04-20: qty 2

## 2021-04-20 MED ORDER — METHOCARBAMOL 500 MG PO TABS
500.0000 mg | ORAL_TABLET | Freq: Three times a day (TID) | ORAL | Status: DC | PRN
  Administered 2021-04-20: 500 mg via ORAL
  Filled 2021-04-20: qty 1

## 2021-04-20 MED ORDER — NITROGLYCERIN 0.4 MG SL SUBL
SUBLINGUAL_TABLET | SUBLINGUAL | Status: DC | PRN
  Administered 2021-04-20: .4 mg via SUBLINGUAL

## 2021-04-20 MED ORDER — HEPARIN SODIUM (PORCINE) 1000 UNIT/ML IJ SOLN
INTRAMUSCULAR | Status: DC | PRN
  Administered 2021-04-20: 10000 [IU] via INTRAVENOUS

## 2021-04-20 MED ORDER — IOHEXOL 350 MG/ML SOLN
INTRAVENOUS | Status: DC | PRN
  Administered 2021-04-20: 120 mL

## 2021-04-20 MED ORDER — INSULIN LISPRO (1 UNIT DIAL) 100 UNIT/ML (KWIKPEN): 14.0000 [IU] | PEN_INJECTOR | SUBCUTANEOUS | Status: DC

## 2021-04-20 MED ORDER — ONDANSETRON HCL 4 MG/2ML IJ SOLN
4.0000 mg | Freq: Four times a day (QID) | INTRAMUSCULAR | Status: DC | PRN
  Administered 2021-04-21: 4 mg via INTRAVENOUS
  Filled 2021-04-20: qty 2

## 2021-04-20 MED ORDER — LABETALOL HCL 5 MG/ML IV SOLN: 10.0000 mg | INTRAVENOUS | Status: AC | PRN

## 2021-04-20 MED ORDER — DULOXETINE HCL 30 MG PO CPEP
30.0000 mg | ORAL_CAPSULE | Freq: Every day | ORAL | Status: DC
  Administered 2021-04-21: 30 mg via ORAL
  Filled 2021-04-20 (×2): qty 1

## 2021-04-20 MED ORDER — SODIUM CHLORIDE 0.9% FLUSH: 3.0000 mL | Freq: Two times a day (BID) | INTRAVENOUS | Status: DC

## 2021-04-20 MED ORDER — HEPARIN SODIUM (PORCINE) 1000 UNIT/ML IJ SOLN
INTRAMUSCULAR | Status: AC
  Filled 2021-04-20: qty 10

## 2021-04-20 MED ORDER — SODIUM CHLORIDE 0.9% FLUSH: 3.0000 mL | INTRAVENOUS | Status: DC | PRN

## 2021-04-20 MED ORDER — FENTANYL CITRATE (PF) 100 MCG/2ML IJ SOLN
INTRAMUSCULAR | Status: AC
  Filled 2021-04-20: qty 2

## 2021-04-20 MED ORDER — FENTANYL CITRATE (PF) 100 MCG/2ML IJ SOLN
INTRAMUSCULAR | Status: DC | PRN
  Administered 2021-04-20 (×2): 25 ug via INTRAVENOUS

## 2021-04-20 MED ORDER — NITROGLYCERIN 0.4 MG SL SUBL
SUBLINGUAL_TABLET | SUBLINGUAL | Status: AC
  Filled 2021-04-20: qty 1

## 2021-04-20 MED ORDER — EZETIMIBE 10 MG PO TABS
10.0000 mg | ORAL_TABLET | Freq: Every day | ORAL | Status: DC
  Administered 2021-04-20: 10 mg via ORAL
  Filled 2021-04-20: qty 1

## 2021-04-20 MED ORDER — LIDOCAINE HCL (PF) 1 % IJ SOLN
INTRAMUSCULAR | Status: DC | PRN
  Administered 2021-04-20: 2 mL
  Administered 2021-04-20: 15 mL

## 2021-04-20 MED ORDER — ALBUTEROL SULFATE (2.5 MG/3ML) 0.083% IN NEBU: 2.5000 mg | INHALATION_SOLUTION | RESPIRATORY_TRACT | Status: DC | PRN

## 2021-04-20 MED ORDER — ASPIRIN 81 MG PO CHEW: 81.0000 mg | CHEWABLE_TABLET | Freq: Once | ORAL | Status: DC

## 2021-04-20 MED ORDER — AMLODIPINE BESYLATE 10 MG PO TABS
10.0000 mg | ORAL_TABLET | Freq: Every day | ORAL | Status: DC
  Administered 2021-04-20: 10 mg via ORAL
  Filled 2021-04-20 (×2): qty 1

## 2021-04-20 MED ORDER — ASPIRIN EC 81 MG PO TBEC: 81.0000 mg | DELAYED_RELEASE_TABLET | Freq: Every day | ORAL | Status: DC

## 2021-04-20 MED ORDER — ALBUTEROL SULFATE HFA 108 (90 BASE) MCG/ACT IN AERS: 2.0000 | INHALATION_SPRAY | RESPIRATORY_TRACT | Status: DC | PRN

## 2021-04-20 MED ORDER — SACUBITRIL-VALSARTAN 97-103 MG PO TABS
1.0000 | ORAL_TABLET | Freq: Two times a day (BID) | ORAL | Status: DC
  Administered 2021-04-20 – 2021-04-21 (×2): 1 via ORAL
  Filled 2021-04-20 (×3): qty 1

## 2021-04-20 MED ORDER — INSULIN ASPART 100 UNIT/ML IJ SOLN: 4.0000 [IU] | Freq: Three times a day (TID) | INTRAMUSCULAR | Status: DC | PRN

## 2021-04-20 MED ORDER — ASPIRIN 81 MG PO CHEW
CHEWABLE_TABLET | ORAL | Status: AC
  Administered 2021-04-20: 81 mg
  Filled 2021-04-20: qty 1

## 2021-04-20 MED ORDER — RANOLAZINE ER 500 MG PO TB12
1000.0000 mg | ORAL_TABLET | Freq: Two times a day (BID) | ORAL | Status: DC
  Administered 2021-04-20 – 2021-04-21 (×2): 1000 mg via ORAL
  Filled 2021-04-20 (×2): qty 2

## 2021-04-20 MED ORDER — TORSEMIDE 20 MG PO TABS
10.0000 mg | ORAL_TABLET | ORAL | Status: DC
  Filled 2021-04-20 (×2): qty 1

## 2021-04-20 MED ORDER — MORPHINE SULFATE (PF) 2 MG/ML IV SOLN: 2.0000 mg | INTRAVENOUS | Status: DC | PRN

## 2021-04-20 MED ORDER — SODIUM CHLORIDE 0.9 % IV SOLN: INTRAVENOUS | Status: AC

## 2021-04-20 MED ORDER — ROSUVASTATIN CALCIUM 20 MG PO TABS
40.0000 mg | ORAL_TABLET | Freq: Every day | ORAL | Status: DC
  Administered 2021-04-20: 40 mg via ORAL
  Filled 2021-04-20: qty 2

## 2021-04-20 MED ORDER — SODIUM CHLORIDE 0.9 % IV SOLN: INTRAVENOUS | Status: DC

## 2021-04-20 MED ORDER — INSULIN GLARGINE 100 UNIT/ML SOLOSTAR PEN: 28.0000 [IU] | PEN_INJECTOR | SUBCUTANEOUS | Status: DC

## 2021-04-20 MED ORDER — FENTANYL CITRATE (PF) 100 MCG/2ML IJ SOLN
INTRAMUSCULAR | Status: DC | PRN
Start: 2021-04-20 — End: 2021-04-20
  Administered 2021-04-20: 25 ug via INTRAVENOUS

## 2021-04-20 MED ORDER — ACETAMINOPHEN 325 MG PO TABS: 650.0000 mg | ORAL_TABLET | ORAL | Status: DC | PRN

## 2021-04-20 MED ORDER — ZOLPIDEM TARTRATE 5 MG PO TABS: 5.0000 mg | ORAL_TABLET | Freq: Every evening | ORAL | Status: DC | PRN

## 2021-04-20 MED ORDER — HEPARIN (PORCINE) IN NACL 1000-0.9 UT/500ML-% IV SOLN
INTRAVENOUS | Status: DC | PRN
  Administered 2021-04-20 (×2): 500 mL

## 2021-04-20 MED ORDER — CLOPIDOGREL BISULFATE 75 MG PO TABS
75.0000 mg | ORAL_TABLET | Freq: Every day | ORAL | Status: DC
  Administered 2021-04-21: 75 mg via ORAL
  Filled 2021-04-20 (×2): qty 1

## 2021-04-20 MED ORDER — INSULIN GLARGINE-YFGN 100 UNIT/ML ~~LOC~~ SOLN
24.0000 [IU] | Freq: Every day | SUBCUTANEOUS | Status: DC
  Filled 2021-04-20 (×2): qty 0.24

## 2021-04-20 SURGICAL SUPPLY — 20 items
BALLN SAPPHIRE 2.5X15 (BALLOONS) ×2
BALLN SCOREFLEX 3.50X15 (BALLOONS) ×2
BALLOON SAPPHIRE 2.5X15 (BALLOONS) IMPLANT
BALLOON SCOREFLEX 3.50X15 (BALLOONS) IMPLANT
CATH BALLN WEDGE 5F 110CM (CATHETERS) ×1 IMPLANT
CATH INFINITI 5 FR 3DRC (CATHETERS) ×1 IMPLANT
CATH INFINITI 5FR MULTPACK ANG (CATHETERS) ×1 IMPLANT
CATH VISTA GUIDE 6FR JR4 (CATHETERS) ×1 IMPLANT
KIT ENCORE 26 ADVANTAGE (KITS) ×1 IMPLANT
KIT HEART LEFT (KITS) ×2 IMPLANT
PACK CARDIAC CATHETERIZATION (CUSTOM PROCEDURE TRAY) ×2 IMPLANT
SHEATH GLIDE SLENDER 4/5FR (SHEATH) ×1 IMPLANT
SHEATH PINNACLE 5F 10CM (SHEATH) ×1 IMPLANT
SHEATH PINNACLE 6F 10CM (SHEATH) ×1 IMPLANT
SHEATH PROBE COVER 6X72 (BAG) ×1 IMPLANT
TRANSDUCER W/STOPCOCK (MISCELLANEOUS) ×2 IMPLANT
TUBING CIL FLEX 10 FLL-RA (TUBING) ×1 IMPLANT
WIRE ASAHI PROWATER 180CM (WIRE) ×1 IMPLANT
WIRE EMERALD 3MM-J .035X150CM (WIRE) ×1 IMPLANT
WIRE HI TORQ VERSACORE-J 145CM (WIRE) ×1 IMPLANT

## 2021-04-20 NOTE — Progress Notes (Signed)
Pt is currently on Lantus 24 units/qHS. ? ?Onnie Boer, PharmD, BCIDP, AAHIVP, CPP ?Infectious Disease Pharmacist ?04/20/2021 5:14 PM ? ? ?

## 2021-04-20 NOTE — Progress Notes (Signed)
Site area: rt groin arterial sheath ?Site Prior to Removal:  Level 0 ?Pressure Applied For: 20 minutes ?Manual:   yes ?Patient Status During Pull:  stable ?Post Pull Site:  Level 0 ?Post Pull Instructions Given:  yes ?Post Pull Pulses Present: rt pt palpable ?Dressing Applied:  gauze and tegaderm ?Bedrest begins @ 1620 ?Comments: ?  ?

## 2021-04-20 NOTE — Progress Notes (Signed)
Heart Failure Navigator Progress Note ? ?Assessed for Heart & Vascular TOC clinic readiness.  ?Patient does not meet criteria due to  ?AHF rounding team following.  ? ?Navigator available for educational resources.  ? ?Pricilla Holm, MSN, RN ?Heart Failure Nurse Navigator ?[phone] ? ? ?

## 2021-04-20 NOTE — H&P (Addendum)
? ?Advanced Heart Failure Team History and Physical Note ?  ?PCP:  [name] Late, MD  ?PCP-Cardiology: [name] Grooms, MD    ? ?Reason for Admission: CAD s/p PCI  ? ? ?HPI:   ? ?[name] [name] is a 76 y.o. female who has a history of CAD s/p CABG, diastolic CHF, and cerebrovascular disease with history of CVA presents for followup of CHF and diastolic CHF. She had CABG x 5 in 12/11.  Prior to the CABG she had multiple PCIs.  She had a CVA in 2/14 that presented as visual loss.  She had a left carotid stent in 2/15.  ?  ?Left heart cath done in Sept. 2014. This showed patent SVG-D, LIMA-LAD, and sequential SVG-OM branches but SVG-RCA and the native RCA were both totally occluded. It was felt aht her increased symptoms coincided with occlusion of SVG-RCA.  She was started on Imdur to see if this would help with the chest pain and dyspnea.  However, she feels like Imdur causes leg cramps and does not think that she can take it.  So ranolazine 500 mg bid started and titrated this up to 1000 mg bid.  This helped some but not markedly.  Therefore, sent to  [name] [name] to address opening RCA CTO. He was able to do this in 5/15 with 4 overlapping Xience DES.  This led to resolution of exertional chest pain.   ?  ?[name] [name] was started on Brilinta after PCI.  She became much more short of breath after starting on Brilinta. Per [name] [name] stopped and replaced with Plavix.  Dyspnea improved off Brilinta. Given increasing exertional dyspnea and chest pain,  RHC/LHC was done in 4/17.  This showed stable CAD with no interventional target.  Left and right heart filling pressures were not significantly increased.  Medical management.  ?  ?She had an MRI/MRA in May 2018 that showed moderately severe stenosis of the supraclinoid segment of the right internal carotid artery, progressed since the 2016 MR angiogram and severe basilar artery stenosis.  ?  ?She developed recurrent exertional chest pain and had LHC in 6/18,  showing 4/5 grafts patent with patent native RCA (similar to prior cath, no changes).   ?  ?Echo 1/19 with EF 55-60%, moderate diastolic dysfunction, PASP 50 mmHg, mildly dilated RV, mild AS, mild-moderate MR.  ?  ?She reported increased dyspnea and chest pain and had RHC/LHC in 9/19.  She had DES to sequential SVG-OM1/PLOM.  While hospitalized, she was noted to have runs of atrial fibrillation.  She was very symptomatic with the atrial fibrillation.  Eliquis and amiodarone were started.  ?  ?She was admitted 12/16-12/19/19 for cardiomems implant and RHC. She was diuresed with IV lasix and transitioned to torsemide 100 mg BID + metolazone once/week. DC weight: 295 lbs. ?  ?She had a prolonged hospitalization in 2/20 with acute respiratory failure and fever to 103.  She was thought to have PNA and treated with cefepime.  She was intubated.  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  Amiodarone was stopped and she was put on prednisone. Echo in 2/20 showed EF down to 25-30% with mid-apical LV severe hypokinesis. She had AKI. Possible ITP triggered by infection, HIT negative.  ITP was treated with Solumedrol. She was discharged to SNF.   ?  ?ORIF left ankle 4/20.  ?  ?11/20 admission initially with concern for TIAs (staring spells), ended up thinking possible partial seizures.  Head MRI  did not show CVA.  Echo in 11/20 showed EF 45-50%, moderately decreased RV systolic function, moderate RV enlargement, moderate-severe MR, moderate TR.  ?  ?Patient was admitted again in 12/20 with AKI and hypotension in the setting of sinus bradycardia (HR 30s).  She was volume overloaded on exam.  Toprol XL was stopped and HR increased to 60s.  She was diuresed with IV Lasix.  TEE in 12/20 showed EF 50-55%, septal-lateral dyssynchrony, mildly decreased RV function, moderate MR.  ?  ?Atypical chest pain in 1/21, Cardiolite showed EF 57%, fixed inferior defect (likely artifact), no ischemia.  ?  ?Echo in  5/22 showed EF 60-65%, moderate LVH, normal RV, PASP 37 mmHg, 2.5 x 2.1 cm calcified mass posterior mitral annulus with mild-moderate MR => ?severe MAC but more circumscribed and prominent than in the past, mild AS mean gradient 12 mmHg and AVA 1.83 cm^2.  Echo in 11/22 showed EF 55%, normal RV, mass posterior mitral annulus, moderate MR, mild AS, IVC normal.  Cardiac MRI was done in 1/23 to investigate posterior mitral annulus mass; study showed LV EF 48%, mild LV dilation, normal RV with EF 53%, extensive MAC is likely the source of the posterior mitral annulus mass, basal-mid inferior >50% subendocardial LGE suggestive of prior inferior MI.  ?  ?Recent clinic f/u, endorsed exertional chest tightness and dyspnea, c/w prior ischemic symptoms. Subsequently referred for [facility] which was done today and demonstrated severe native coronary disease with 90% in-stent restenosis in the proximal RCA (patient has 2 layers of stent at this site). This is felt to be culprit for her recent symptoms. The sequential SVG-OM1/PLOM is patent, SVG-D is patent, and LIMA-LAD is patent. RHC showed normal PCWP and RA pressure (elevated LVEDP but unsure if accurate as well out of proportion) and mild pulmonary HTN.  ? ?She underwent successful PCI to in-stent restenosis in the proximal RCA. She is being admitted for overnight observation post PCI.   ? ? ?R/LHC 04/20/21 ?  Prox RCA lesion is 90% stenosed. ?  Prox RCA to Mid RCA lesion is 70% stenosed. ?  Mid RCA to Dist RCA lesion is 40% stenosed. ?  Origin lesion is 100% stenosed. ?  Ost Cx to Prox Cx lesion is 70% stenosed. ?  1st Mrg lesion is 100% stenosed. ?  3rd Mrg lesion is 100% stenosed. ?  Ost LAD to Prox LAD lesion is 90% stenosed. ?  Prox LAD lesion is 100% stenosed. ? ? ?Right Heart Pressures RHC Procedural Findings: ?Hemodynamics (mmHg) ?RA mean 2 ?RV 36/14 ?PA 36/8, mean 16 ?PCWP mean 14 ?LV 130/?22 ?AO 132/46 ? ?Oxygen saturations: ?PA 80% ?AO 99% ? ?Cardiac Output (Fick)  6.51  ?Cardiac Index (Fick) 3.28 ?PVR < 1 WU  ? ? ?  ?1. Normal PCWP and RA pressure (elevated LVEDP but unsure if accurate as well out of proportion).  ?2. Mild pulmonary venous hypertension. ?3. Preserved cardiac output.  ?4. Severe native coronary disease with 90% in-stent restenosis in the proximal RCA (patient has 2 layers of stent at this site).  This is likely the source of her symptoms given occluded SVG-RCA.  The sequential SVG-OM1/PLOM is patent, SVG-D is patent, and LIMA-LAD is patent.  ? ? ?Review of Systems: [y] = yes, _0  = no  ? ?General: Weight gain _1 ; Weight loss _2 ; Anorexia _3 ; Fatigue _4 ; Fever _5 ; Chills _6 ; Weakness _7   ?Cardiac: Chest pain/pressure [ Y]; Resting SOB _8 ; Exertional  SOB [Y]; Orthopnea _0 ; Pedal Edema _1 ; Palpitations _2 ; Syncope _3 ; Presyncope _4 ; Paroxysmal nocturnal dyspnea_5   ?Pulmonary: Cough _6 ; Wheezing_7 ; Hemoptysis_8 ; Sputum _9 ; Snoring _10   ?GI: Vomiting_11 ; Dysphagia_12 ; Melena_13 ; Hematochezia _14 ; Heartburn_15 ; Abdominal pain _16 ; Constipation _17 ; Diarrhea _18 ; BRBPR _19   ?GU: Hematuria_20 ; Dysuria _21 ; Nocturia_22   ?Vascular: Pain in legs with walking _23 ; Pain in feet with lying flat _24 ; Non-healing sores _25 ; Stroke _26 ; TIA _27 ; Slurred speech _28 ;  ?Neuro: Headaches_29 ; Vertigo_30 ; Seizures_31 ; Paresthesias_32 ;Blurred vision _33 ; Diplopia _34 ; Vision changes _35   ?Ortho/Skin: Arthritis _36 ; Joint pain _37 ; Muscle pain _38 ; Joint swelling _39 ; Back Pain _40 ; Rash _41   ?Psych: Depression_42 ; Anxiety_43   ?Heme: Bleeding problems _44 ; Clotting disorders _45 ; Anemia _46   ?Endocrine: Diabetes _47 ; Thyroid dysfunction_48  ? ? ?Home Medications ?Prior to Admission medications   ?Medication Sig Start Date End Date Taking? Authorizing Provider  ?acetaminophen (TYLENOL) 500 MG tablet Take 500 mg by mouth every 6 (six) hours as needed for mild pain, moderate pain, fever or headache.   Yes [provider]  ?albuterol (VENTOLIN HFA) 108 (90 Base)  MCG/ACT inhaler Inhale 2 puffs into the lungs every 4 (four) hours as needed for wheezing or shortness of breath.    Yes [provider]  ?Alirocumab (PRALUENT) 150 MG/ML SOAJ Inject 150 mg/mL int

## 2021-04-20 NOTE — Interval H&P Note (Signed)
History and Physical Interval Note: ? ?04/20/2021 ?9:09 AM ? ?[name] [name]  has presented today for surgery, with the diagnosis of heart failure/angina.  The various methods of treatment have been discussed with the patient and family. After consideration of risks, benefits and other options for treatment, the patient has consented to  Procedure(s): ?RIGHT/LEFT HEART CATH AND CORONARY/GRAFT ANGIOGRAPHY (N/A) as a surgical intervention.  The patient's history has been reviewed, patient examined, no change in status, stable for surgery.  I have reviewed the patient's chart and labs.  Questions were answered to the patient's satisfaction.   ? ? ?[name] [name] ? ? ?

## 2021-04-21 ENCOUNTER — Other Ambulatory Visit (HOSPITAL_COMMUNITY): Payer: Self-pay

## 2021-04-21 ENCOUNTER — Encounter (HOSPITAL_COMMUNITY): Payer: Self-pay | Admitting: Cardiovascular Disease

## 2021-04-21 DIAGNOSIS — I25110 Atherosclerotic heart disease of native coronary artery with unstable angina pectoris: Secondary | ICD-10-CM | POA: Diagnosis not present

## 2021-04-21 DIAGNOSIS — I208 Other forms of angina pectoris: Secondary | ICD-10-CM | POA: Diagnosis not present

## 2021-04-21 DIAGNOSIS — Z951 Presence of aortocoronary bypass graft: Secondary | ICD-10-CM | POA: Diagnosis not present

## 2021-04-21 DIAGNOSIS — I480 Paroxysmal atrial fibrillation: Secondary | ICD-10-CM | POA: Diagnosis not present

## 2021-04-21 DIAGNOSIS — Z8673 Personal history of transient ischemic attack (TIA), and cerebral infarction without residual deficits: Secondary | ICD-10-CM | POA: Diagnosis not present

## 2021-04-21 LAB — CBC
HCT: 39 % (ref 36.0–46.0)
Hemoglobin: 13.3 g/dL (ref 12.0–15.0)
MCH: 32.4 pg (ref 26.0–34.0)
MCHC: 34.1 g/dL (ref 30.0–36.0)
MCV: 95.1 fL (ref 80.0–100.0)
Platelets: 133 10*3/uL — ABNORMAL LOW (ref 150–400)
RBC: 4.1 MIL/uL (ref 3.87–5.11)
RDW: 12.8 % (ref 11.5–15.5)
WBC: 8.4 10*3/uL (ref 4.0–10.5)
nRBC: 0 % (ref 0.0–0.2)

## 2021-04-21 LAB — BASIC METABOLIC PANEL
Anion gap: 9 (ref 5–15)
BUN: 22 mg/dL (ref 8–23)
CO2: 22 mmol/L (ref 22–32)
Calcium: 8.7 mg/dL — ABNORMAL LOW (ref 8.9–10.3)
Chloride: 108 mmol/L (ref 98–111)
Creatinine, Ser: 1.02 mg/dL — ABNORMAL HIGH (ref 0.44–1.00)
GFR, Estimated: 58 mL/min — ABNORMAL LOW (ref >60)
Glucose, Bld: 134 mg/dL — ABNORMAL HIGH (ref 70–99)
Potassium: 4.3 mmol/L (ref 3.5–5.1)
Sodium: 139 mmol/L (ref 135–145)

## 2021-04-21 MED ORDER — ASPIRIN EC 81 MG PO TBEC: 81.0000 mg | DELAYED_RELEASE_TABLET | Freq: Every day | ORAL | 0 refills | Status: DC

## 2021-04-21 MED ORDER — CLOPIDOGREL BISULFATE 75 MG PO TABS
75.0000 mg | ORAL_TABLET | Freq: Every day | ORAL | 3 refills | Status: DC
Start: 2021-04-21 — End: 2021-05-05

## 2021-04-21 NOTE — TOC Initial Note (Signed)
Transition of Care (TOC) - Initial/Assessment Note  ? ? ?Patient Details  ?Name: [name] [name] ?[name]: [id number] ?Date of Birth: 1945/09/30 ? ?Transition of Care [facility]) CM/SW Contact:    ?Marcheta Grammes Rexene Alberts, RN ?Phone Number: [phone] ?04/21/2021, 2:36 PM ? ?Clinical Narrative:                 ?HF TOC CM spoke to pt and no needs identified. Husband at home to assist pt as needed.  ? ?Expected Discharge Plan: Home/Self Care ?Barriers to Discharge: No Barriers Identified ? ? ?Patient Goals and CMS Choice ?Patient states their goals for this hospitalization and ongoing recovery are:: remain independent ?  ?  ? ?Expected Discharge Plan and Services ?Expected Discharge Plan: Home/Self Care ?  ?Discharge Planning Services: CM Consult ?  ?Living arrangements for the past 2 months: [place] ?Expected Discharge Date: 04/21/21               ?  ?  ?  ?  ?  ?  ?  ?  ?  ?  ? ?Prior Living Arrangements/Services ?Living arrangements for the past 2 months: [place] ?Lives with:: Spouse ?Patient language and need for interpreter reviewed:: Yes ?Do you feel safe going back to the place where you live?: Yes      ?Need for Family Participation in Patient Care: No (Comment) ?Care giver support system in place?: No (comment) ?Current home services: DME (rollator) ?Criminal Activity/Legal Involvement Pertinent to Current Situation/Hospitalization: No - Comment as needed ? ?Activities of Daily Living ?Home Assistive Devices/Equipment: CBG Meter, Built-in shower seat, Blood pressure cuff, Bedside commode/3-in-1, Eyeglasses, Grab bars in shower, Grab bars around toilet, Hand-held shower hose, Long-handled shoehorn, Long-handled sponge, Nebulizer, Oxygen, Raised toilet seat with rails, Reacher, Sock aid, Walker (specify type) ?ADL Screening (condition at time of admission) ?Patient's cognitive ability adequate to safely complete daily activities?: Yes ?Is the patient deaf or have difficulty hearing?: No ?Does the patient  have difficulty seeing, even when wearing glasses/contacts?: No ?Does the patient have difficulty concentrating, remembering, or making decisions?: No ?Patient able to express need for assistance with ADLs?: Yes ?Does the patient have difficulty dressing or bathing?: No ?Independently performs ADLs?: Yes (appropriate for developmental age) ?Does the patient have difficulty walking or climbing stairs?: Yes ?Weakness of Legs: Both ?Weakness of Arms/Hands: Right ? ?Permission Sought/Granted ?Permission sought to share information with : Case Manager, Family Supports, PCP ?  ?   ?   ?   ?   ? ?Emotional Assessment ?Appearance:: Appears stated age ?Attitude/Demeanor/Rapport: Gracious ?Affect (typically observed): Accepting ?Orientation: : Oriented to Self, Oriented to Place, Oriented to  Time, Oriented to Situation ?  ?Psych Involvement: No (comment) ? ?Admission diagnosis:  CAD (coronary artery disease) [I25.10] ?Unstable angina (HCC) [I20.0] ?Patient Active Problem List  ? Diagnosis Date Noted  ? CAD (coronary artery disease) 04/20/2021  ? Symptomatic bradycardia 01/16/2019  ? Normocytic anemia 01/16/2019  ? Diabetic peripheral neuropathy ([place]) 01/12/2019  ? Neurologic abnormality 12/25/2018  ? Debility 09/21/2018  ? Hypotension 09/18/2018  ? Bradycardia 09/18/2018  ? AKI (acute kidney injury) ([place]) 09/18/2018  ? Pressure injury of skin 09/18/2018  ? Ischemic cerebrovascular accident (CVA) of frontal lobe ([place]) 09/13/2018  ? Fall 09/09/2018  ? History of CVA (cerebrovascular accident) 09/09/2018  ? Type II diabetes mellitus with renal manifestations ([place]) 09/09/2018  ? CKD (chronic kidney disease), stage III 09/09/2018  ? Sepsis ([place]) 09/09/2018  ? Depression with anxiety 09/09/2018  ?  Slurred speech 09/09/2018  ? Ankle fracture, left 05/16/2018  ? Amiodarone pulmonary toxicity   ? Physical deconditioning   ? 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 [facility])   ? Acute on chronic congestive heart failure ([place])   ? Chronic combined  systolic (congestive) and diastolic (congestive) heart failure ([place]) 01/16/2018  ? Chronic systolic CHF (congestive heart failure) ([place]) 01/16/2018  ? Atrial fibrillation ([place]) 10/21/2017  ? CAD (coronary artery disease) of bypass graft 10/20/2017  ? Coronary artery disease of bypass graft of native heart with stable angina pectoris ([place])   ? Chronic low back pain 08/24/2016  ? Pain of left thumb 08/05/2016  ? Nocturnal hypoxia 06/02/2016  ? Hoarseness 04/05/2016  ? Laryngopharyngeal reflux (LPR) 04/05/2016  ? Pharyngoesophageal dysphagia 04/05/2016  ? Angina decubitus ([place]) 05/22/2015  ? Chronic insomnia 05/06/2015  ? Common migraine 05/14/2014  ? Abnormality of gait 05/14/2014  ? Memory change 05/14/2014  ? Aneurysm, cerebral, nonruptured 05/14/2014  ? Cramp of limb-Left neck 05/10/2014  ? Hematemesis with nausea   ? Vomiting blood   ? Dizziness and giddiness 09/21/2013  ? Atypical chest pain 07/18/2013  ? Unstable angina ([place]) 04/09/2013  ? Carotid artery stenosis, symptomatic 03/20/2013  ? Cerebrovascular disease 07/10/2012  ? Cerebral artery occlusion with cerebral infarction ([place]) 07/10/2012  ? Mitral regurgitation 04/15/2012  ? TIA (transient ischemic attack) 04/15/2012  ? Occlusion and stenosis of carotid artery without mention of cerebral infarction 08/18/2011  ? Bariatric surgery status 06/17/2011  ? Speech abnormality 03/22/2011  ? Dyspnea 02/24/2011  ? PAPILLARY MUSCLE DYSFUNCTION, NON-RHEUMATIC 10/09/2008  ? UNSPECIFIED VITAMIN D DEFICIENCY 10/24/2007  ? MYOCARDIAL INFARCTION, HX OF 10/24/2007  ? PERSISTENT VOMITING 10/24/2007  ? OSTEOARTHRITIS 10/24/2007  ? MIGRAINES, HX OF 10/24/2007  ? Diabetes mellitus type 2, controlled, with complications ([place]) 95/62/1308  ? Hyperlipemia 01/16/2007  ? Obesity-post failed open gastroplasty 1984  01/16/2007  ? OBSTRUCTIVE SLEEP APNEA 01/16/2007  ? Essential hypertension 01/16/2007  ? Coronary atherosclerosis 01/16/2007  ? Asthma 01/16/2007  ? GERD 01/16/2007  ?  VENTRAL HERNIA 01/16/2007  ? ?PCP:  Derinda Late, MD ?Pharmacy:   ?[facility] [place], [place] - [address] ?[address] ?[place] [address] ?Phone: [phone] Fax: [phone] ? ? ? ? ?Social Determinants of Health (SDOH) Interventions ?  ? ?Readmission Risk Interventions ?Readmission Risk Prevention Plan 01/17/2019  ?Transportation Screening Complete  ?Medication Review Press photographer) Complete  ?PCP or Specialist appointment within 3-5 days of discharge Complete  ?[place] or Home Care Consult Complete  ?SW Recovery Care/Counseling Consult Complete  ?Palliative Care Screening Not Applicable  ?[place] Not Applicable  ?Some recent data might be hidden  ? ? ? ?

## 2021-04-21 NOTE — Progress Notes (Addendum)
? ? Advanced Heart Failure Rounding Note ? ?PCP-Cardiologist: Larae Grooms, MD  ?[facility]: Dr. [name]  ? ?Patient Profile  ?  ?76 y/o female w/ known CAD s/p prior PCIs and CABG, chronic systolic heart failure, HTN, HLD, Afib and prior CVA, referred for outpatient R/LHC in the setting of new exertional CP and dyspnea, found to have in-stent restenosis in the proximal RCA, treated w/ PCI. Admitted for overnight observation.  ? ?Subjective:   ? ?Feels well this morning. No chest pain. No resting dyspnea. Has not yet ambulated the halls but no dyspnea walking around room. Groin sore but stable.  ? ?VSS ? ?SCr stable, 1.02  ? ? ?Objective:   ?Weight Range: ?99.2 kg ?Body mass index is 40.02 kg/m?.  ? ?Vital Signs:   ?Temp:  [97.6 ?F (36.4 ?C)-98.4 ?F (36.9 ?C)] 98 ?F (36.7 ?C) (03/21 2706) ?Pulse Rate:  [0-79] 51 (03/21 2376) ?Resp:  [10-24] 15 (03/21 2831) ?BP: (104-152)/(30-90) 138/56 (03/21 5176) ?SpO2:  [95 %-100 %] 97 % (03/21 1607) ?Weight:  [98.9 kg-99.2 kg] 99.2 kg (03/21 3710) ?Last BM Date : 04/20/21 ? ?Weight change: ?Filed Weights  ? 04/20/21 0757 04/21/21 6269  ?Weight: 98.9 kg 99.2 kg  ? ? ?Intake/Output:  ? ?Intake/Output Summary (Last 24 hours) at 04/21/2021 0746 ?Last data filed at 04/20/2021 2221 ?Gross per 24 hour  ?Intake 846.25 ml  ?Output 400 ml  ?Net 446.25 ml  ?  ? ? ?Physical Exam  ?  ?General:  Well appearing, moderate obese. No resp difficulty ?HEENT: Normal ?Neck: Supple.  No JVP . Carotids 2+ bilat; no bruits. No lymphadenopathy or thyromegaly appreciated. ?Cor: PMI nondisplaced. Regular rhythm, slow rate. No rubs, gallops or murmurs. ?Lungs: Clear ?Abdomen: Soft, nontender, nondistended. No hepatosplenomegaly. No bruits or masses. Good bowel sounds. ?Extremities: No cyanosis, clubbing, rash, edema Rt groin soft, no hematoma  ?Neuro: Alert & orientedx3, cranial nerves grossly intact. moves all 4 extremities w/o difficulty. Affect pleasant ? ? ?Telemetry  ? ?Sinus brady, upper 50s ? ?EKG  ?   ?SB 53 bpm w/ LBBB, personally reviewed  ? ?Labs  ?  ?CBC ?Recent Labs  ?  04/20/21 ?1105 04/21/21 ?0241  ?WBC  --  8.4  ?HGB 12.6  12.2 13.3  ?HCT 37.0  36.0 39.0  ?MCV  --  95.1  ?PLT  --  133*  ? ?Basic Metabolic Panel ?Recent Labs  ?  04/20/21 ?0822 04/20/21 ?1105 04/21/21 ?0241  ?NA 142 142  142 139  ?K 4.4 4.3  4.1 4.3  ?CL 105  --  108  ?CO2 26  --  22  ?GLUCOSE 142*  --  134*  ?BUN 29*  --  22  ?CREATININE 1.19*  --  1.02*  ?CALCIUM 9.9  --  8.7*  ? ?Liver Function Tests ?No results for input(s): AST, ALT, ALKPHOS, BILITOT, PROT, ALBUMIN in the last 72 hours. ?No results for input(s): LIPASE, AMYLASE in the last 72 hours. ?Cardiac Enzymes ?No results for input(s): CKTOTAL, CKMB, CKMBINDEX, TROPONINI in the last 72 hours. ? ?BNP: ?BNP (last 3 results) ?Recent Labs  ?  08/10/20 ?1014 11/17/20 ?1450 03/31/21 ?1303  ?BNP 469.3* 86.6 339.3*  ? ? ?ProBNP (last 3 results) ?No results for input(s): PROBNP in the last 8760 hours. ? ? ?D-Dimer ?No results for input(s): DDIMER in the last 72 hours. ?Hemoglobin A1C ?No results for input(s): HGBA1C in the last 72 hours. ?Fasting Lipid Panel ?No results for input(s): CHOL, HDL, LDLCALC, TRIG, CHOLHDL, LDLDIRECT in the  last 72 hours. ?Thyroid Function Tests ?No results for input(s): TSH, T4TOTAL, T3FREE, THYROIDAB in the last 72 hours. ? ?Invalid input(s): FREET3 ? ?Other results: ? ? ?Imaging  ? ? ?CARDIAC CATHETERIZATION ? ?Result Date: 04/20/2021 ?Images from the original result were not included.   Prox RCA lesion is 90% stenosed.   Prox RCA to Mid RCA lesion is 70% stenosed.   Mid RCA to Dist RCA lesion is 40% stenosed.   Origin lesion is 100% stenosed.   Ost Cx to Prox Cx lesion is 70% stenosed.   1st Mrg lesion is 100% stenosed.   3rd Mrg lesion is 100% stenosed.   Ost LAD to Prox LAD lesion is 90% stenosed.   Prox LAD lesion is 100% stenosed.   Scoring balloon angioplasty was performed.   Post intervention, there is a 0% residual stenosis. 1. Normal PCWP and RA  pressure (elevated LVEDP but unsure if accurate as well out of proportion). 2. Mild pulmonary venous hypertension. 3. Preserved cardiac output. 4. Severe native coronary disease with 90% in-stent restenosis in the proximal RCA (patient has 2 layers of stent at this site).  This is likely the source of her symptoms given occluded SVG-RCA.  The sequential SVG-OM1/PLOM is patent, SVG-D is patent, and LIMA-LAD is patent. Discussed with Dr. Gwenlyn Found, will plan PCI to in-stent restenosis in the proximal RCA as this is the likely cause of her intractable symptoms.  She will need ASA 81 + Plavix + Eliquis x 1 month then Plavix + Eliquis after that. TAI SYFERT is a 76 y.o. female  [id number] LOCATION:  FACILITY: [facility] PHYSICIAN: Quay Burow, M.D. 1945/09/30 DATE OF PROCEDURE:  04/20/2021 DATE OF DISCHARGE: CARDIAC CATHETERIZATION /PVI RCA History obtained from chart review.  Ms. Tye is a 76 year old female patient of Dr. Claris Gladden with history of CABG in 2011.  She had ischemic cardiomyopathy which improved to normal EF with guideline directed optimal medical therapy.  She had a CTO intervention by Dr. Irish Lack in the past and has 2 layers of stent in her proximal RCA.  She underwent right and left heart cath by Dr. [name] today revealing an occluded RCA vein graft and 99% "in-stent restenosis within the proximal RCA stented segment which had overlapping drug-eluting stents.  She presents now for PCI of the proximal RCA. PROCEDURE DESCRIPTION: The patient was brought to the second floor [facility] cath lab in the postabsorptive state.  She was premedicated with IV Versed and fentanyl.  Her right groin was prepped and shaved in usual sterile fashion. Xylocaine 1% was used for local anesthesia. A 6 French sheath was inserted into the right common femoral artery using standard Seldinger technique. The patient received 10,000 units  of heparin intravenously.  A 6 [place] JR4 catheter along with a 0.14 [place] guidewire  and a 2.5 x 15 mm long compliant balloon was used to angioplasty the high-grade "in-stent restenosis.  Isovue dye is used for the entirety of the case (120 cc of contrast total to patient.  Retrograde aortic pressures monitored during the case.  The ACT was 323 prior to intervention.  The patient was on uninterrupted clopidogrel in addition to Eliquis because of PAF. I crossed the lesion with little difficulty.  I predilated with a 2.5 mm x 15 mm long balloon and performed Cutting Balloon angioplasty with a 3.5 mm x 15 mm long score flex at 16 atm (3.6 mm) resulting in reduction of a 99% "in-stent restenosis to 0% residual with TIMI-3 flow.  There was 50 to 60% ostial RCA stenosis.  The patient tolerated procedure well.  ? ?Successful score flex Cutting Balloon angioplasty of high-grade "in-stent restenosis within the proximal RCA stented segment which had overlapping stents.  Dr. [name] did the diagnostic case as well as the note for that.  The sheath will be pulled once ACT falls below 170 and pressure held.  The patient be gently hydrated.  She did receive aspirin this morning and will be on "triple therapy" for 1 month with Eliquis, clopidogrel and aspirin after which the aspirin can be discontinued.  She left the lab in stable condition. Quay Burow. MD, [facility] 04/20/2021 12:23 PM    ? ? ?Medications:   ? ? ?Scheduled Medications: ? amLODipine  10 mg Oral Daily  ? apixaban  5 mg Oral BID  ? aspirin  81 mg Oral Daily  ? clopidogrel  75 mg Oral QHS  ? DULoxetine  30 mg Oral Daily  ? ezetimibe  10 mg Oral QHS  ? insulin glargine-yfgn  24 Units Subcutaneous QHS  ? insulin lispro  14-20 Units Subcutaneous See admin instructions  ? pantoprazole  40 mg Oral Daily  ? ranolazine  1,000 mg Oral BID  ? rosuvastatin  40 mg Oral QHS  ? sacubitril-valsartan  1 tablet Oral BID  ? sodium chloride flush  3 mL Intravenous Q12H  ? sodium chloride flush  3 mL Intravenous Q12H  ? torsemide  10 mg Oral QODAY  ? ? ?Infusions: ?  sodium chloride    ? sodium chloride    ? ? ?PRN Medications: ?sodium chloride, sodium chloride, acetaminophen, albuterol, insulin aspart, methocarbamol, morphine injection, ondansetron (ZOFRAN) IV, sodium ch

## 2021-04-21 NOTE — Progress Notes (Signed)
CARDIAC REHAB PHASE I  ? ?PRE:  Rate/Rhythm: 6 SB ? ?BP:  Sitting: 118/49     ? ?SaO2: 97 RA ? ?MODE:  Ambulation: 70 ft  ? ?POST:  Rate/Rhythm: 83 SR ? ?BP:  Sitting: 153/67 ? ?  SaO2: 98 RA ? ? ?Pt ambulated 32f in hallway assist of one with front wheel walker. Pt denies CP, or dizziness. States some mild SOB, but much improved since procedure. Pt educated on importance of ASA and Plavix. Pt given heart healthy and diabetic diets. Reviewed site care, restrictions, and exercise guidelines. Will refer to CRP II [place] with knowledge pt needs to complete outpatient physical therapy first. ? ?[phone]?TRufina Falco RN BSN ?04/21/2021 ?9:05 AM ? ?

## 2021-04-21 NOTE — Care Management Obs Status (Signed)
MEDICARE OBSERVATION STATUS NOTIFICATION ? ? ?Patient Details  ?Name: [name] [name] ?[name]: [id number] ?Date of Birth: [birth year] ? ? ?Medicare Observation Status Notification Given:  Yes ? ? ? ?Erenest Rasher, RN ?04/21/2021, 11:07 AM ?

## 2021-04-21 NOTE — Discharge Summary (Signed)
Advanced Heart Failure Team ? ?Discharge Summary  ? ?Patient ID: [name] [name] ?[name]: [id number], DOB/AGE: 1945/08/25 76 y.o. Admit date: 04/20/2021 ?D/C date:     04/21/2021  ? ?Primary Discharge Diagnoses:  ?Unstable Angina ?CAD s/p PCI  ? ?Secondary Discharge Diagnoses:  ?Hyperlipidemia ?Hypertension  ?Chronic Systolic==>Diastolic Heart Failure ?PAF ?Cerebrovascular Disease  ?Mitral Regurgitation ?Aortic Stenosis  ?Bradycardia ?OHS/OSA ?Obesity  ? ?Hospital Course:  ? ?[name] [name] is a 76 y.o. female who has a history of CAD s/p CABG, diastolic CHF, and cerebrovascular disease with history of CVA presents for followup of CHF and diastolic CHF. She had CABG x 5 in 12/11.  Prior to the CABG she had multiple PCIs.  She had a CVA in 2/14 that presented as visual loss.  She had a left carotid stent in 2/15.  ?  ?Left heart cath done in Sept. 2014. This showed patent SVG-D, LIMA-LAD, and sequential SVG-OM branches but SVG-RCA and the native RCA were both totally occluded. It was felt aht her increased symptoms coincided with occlusion of SVG-RCA.  She was started on Imdur to see if this would help with the chest pain and dyspnea.  However, she feels like Imdur causes leg cramps and does not think that she can take it.  So ranolazine 500 mg bid started and titrated this up to 1000 mg bid.  This helped some but not markedly.  Therefore, sent to  Dr. Irish Lack to address opening RCA CTO. He was able to do this in 5/15 with 4 overlapping Xience DES.  This led to resolution of exertional chest pain.   ?  ?[name] [name] was started on Brilinta after PCI.  She became much more short of breath after starting on Brilinta. Per Dr Delano Metz stopped and replaced with Plavix.  Dyspnea improved off Brilinta. Given increasing exertional dyspnea and chest pain,  RHC/LHC was done in 4/17.  This showed stable CAD with no interventional target.  Left and right heart filling pressures were not significantly increased.  Medical management.   ?  ?She had an MRI/MRA in May 2018 that showed moderately severe stenosis of the supraclinoid segment of the right internal carotid artery, progressed since the 2016 MR angiogram and severe basilar artery stenosis.  ?  ?She developed recurrent exertional chest pain and had LHC in 6/18, showing 4/5 grafts patent with patent native RCA (similar to prior cath, no changes).   ?  ?Echo 1/19 with EF 55-60%, moderate diastolic dysfunction, PASP 50 mmHg, mildly dilated RV, mild AS, mild-moderate MR.  ?  ?She reported increased dyspnea and chest pain and had RHC/LHC in 9/19.  She had DES to sequential SVG-OM1/PLOM.  While hospitalized, she was noted to have runs of atrial fibrillation.  She was very symptomatic with the atrial fibrillation.  Eliquis and amiodarone were started.  ?  ?She was admitted 12/16-12/19/19 for cardiomems implant and RHC. She was diuresed with IV lasix and transitioned to torsemide 100 mg BID + metolazone once/week. DC weight: 295 lbs. ?  ?She had a prolonged hospitalization in 2/20 with acute respiratory failure and fever to 103.  She was thought to have PNA and treated with cefepime.  She was intubated.  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 Amiodarone was stopped and she was put on prednisone. Echo in 2/20 showed EF down to 25-30% with mid-apical LV severe hypokinesis. She had AKI. Possible ITP triggered by infection, HIT negative.  ITP was treated with Solumedrol. She  was discharged to SNF.   ?  ?ORIF left ankle 4/20.  ?  ?11/20 admission initially with concern for TIAs (staring spells), ended up thinking possible partial seizures.  Head MRI did not show CVA.  Echo in 11/20 showed EF 45-50%, moderately decreased RV systolic function, moderate RV enlargement, moderate-severe MR, moderate TR.  ?  ?Patient was admitted again in 12/20 with AKI and hypotension in the setting of sinus bradycardia (HR 30s).  She was volume overloaded on exam.  Toprol XL was stopped and  HR increased to 60s.  She was diuresed with IV Lasix.  TEE in 12/20 showed EF 50-55%, septal-lateral dyssynchrony, mildly decreased RV function, moderate MR.  ?  ?Atypical chest pain in 1/21, Cardiolite showed EF 57%, fixed inferior defect (likely artifact), no ischemia.  ?  ?Echo in 5/22 showed EF 60-65%, moderate LVH, normal RV, PASP 37 mmHg, 2.5 x 2.1 cm calcified mass posterior mitral annulus with mild-moderate MR => ?severe MAC but more circumscribed and prominent than in the past, mild AS mean gradient 12 mmHg and AVA 1.83 cm^2.  Echo in 11/22 showed EF 55%, normal RV, mass posterior mitral annulus, moderate MR, mild AS, IVC normal.  Cardiac MRI was done in 1/23 to investigate posterior mitral annulus mass; study showed LV EF 48%, mild LV dilation, normal RV with EF 53%, extensive MAC is likely the source of the posterior mitral annulus mass, basal-mid inferior >50% subendocardial LGE suggestive of prior inferior MI.  ?  ?Recent clinic f/u, endorsed exertional chest tightness and dyspnea, c/w prior ischemic symptoms. Subsequently referred for [facility] which demonstrated severe native coronary disease with 90% in-stent restenosis in the proximal RCA (patient has 2 layers of stent at this site). This is felt to be culprit for her recent symptoms. The sequential SVG-OM1/PLOM is patent, SVG-D is patent, and LIMA-LAD is patent. RHC showed normal PCWP and RA pressure (elevated LVEDP but unsure if accurate as well out of proportion) and mild pulmonary HTN.  ? ?She underwent successful PCI to in-stent restenosis in the proximal RCA.  She was direct admitted from cath lab for overnight observation post PCI.  She had no post cath complications. Femoral access site stable w/o hematoma. Vital signs and renal function stable. No further CP or dyspnea.  ? ?Plan will be tripple therapy w/ ASA, Plavix and Eliquis x 30 days. After 1 month, will d/c ASA. She will continue on statin/zetia/PCSK9i. Not on ? blocker given bradycardia.   ? ?On 3/21, she was last seen and examined by Dr. [name] and felt stable for d/c home. Post hospital f/u arranged in the [facility] on 05/05/21.  ? ?See Detailed Hospital Problem List Below  ? ?1. CAD: Occluded native RCA and SVG-RCA.  Status post opening of CTO RCA with 4 overlapping Xience DES in 5/15.  DES to sequential SVG-OM1/PLOM in 9/19.  She has been on Plavix and Eliquis.  No ischemia on 1/21 Cardiolite.  For the last month, patient has been having typical angina after walking about 100 feet that resolves with rest, no CP at rest. LHC done yesterday showed severe native coronary disease with 90% in-stent restenosis in the proximal RCA (patient has 2 layers of stent at this site). This is felt to be culprit for her recent symptoms. The sequential SVG-OM1/PLOM is patent, SVG-D is patent, and LIMA-LAD is patent. She underwent PCI of pRCA. Chest pain free. No dyspnea.  ?- Plan DAPT w/ ASA + Plavix (has been on chronic Plavix, neuro has wanted  her to stay on this for cerebrovascular disease).   ?- Restart Eliquis today, Plan triple therapy x 30 days, then can drop ASA  ?- She has not tolerated Imdur in the past due to headache. Off Toprol XL with bradycardia.  Continue ranolazine 1000 mg bid and amlodipine 5 mg daily (decreased due to orthostatic symptoms) for angina.  ?2. Chronic systolic => diastolic CHF: Echo in 3/00 with EF 55-60%, moderate diastolic dysfunction. Cardiomems placed 01/16/18.  Echo in 3/20 in setting of severe PNA with intubation showed EF 25-30%, mid-apical LV severe hypokinesis.  Possible stress (Takotsubo-type) cardiomyopathy related to severe medical illness versus progression of CAD.  She has baseline chronic LBBB which could play a role in cardiomyopathy as well (LBBB cardiomyopathy).  Repeat echo in 11/20 with EF up to 45-50%, moderately dysfunctional RV.  TEE in 12/20 with EF 50-55% with septal-lateral dyssynchrony and mildly dysfunctional RV.  Most recent echo in 11/22 with EF 55% with  normal RV.  cMRI in 1/23 with EF 48% and evidence for prior inferior MI.  NYHA class III symptoms, worse recently but suspect likely due to high grade RCA disease as found on cath, now s/p RCA PCI. RHC sh

## 2021-04-21 NOTE — Discharge Instructions (Addendum)
Information about your medication: Plavix (anti-platelet agent) ? ?Generic Name (Brand): clopidogrel (Plavix), once daily medication ? ?PURPOSE: You are taking this medication along with aspirin to lower your chance of having a heart attack, stroke, or blood clots in your heart stent. These can be fatal. Plavix and aspirin help prevent platelets from sticking together and forming a clot that can block an artery or your stent.  ? ?Common SIDE EFFECTS you may experience include: bruising or bleeding more easily, shortness of breath ? ?Do not stop taking PLAVIX without talking to the doctor who prescribes it for you. People who are treated with a stent and stop taking Plavix too soon, have a higher risk of getting a blood clot in the stent, having a heart attack, or dying. If you stop Plavix because of bleeding, or for other reasons, your risk of a heart attack or stroke may increase.  ? ?Avoid taking NSAID agents or anti-inflammatory medications such as ibuprofen, naproxen given increased bleed risk with plavix - can use acetaminophen (Tylenol) if needed for pain. ? ?Avoid taking over the counter stomach medications omeprazole (Prilosec) or esomeprazole (Nexium) since these do interact and make plavix less effective - ask your pharmacist or doctor for alterative agents if needed for heartburn or GERD.  ? ?Tell all of your doctors and dentists that you are taking Plavix. They should talk to the doctor who prescribed Plavix for you before you have any surgery or invasive procedure.  ? ?Contact your health care provider if you experience: severe or uncontrollable bleeding, pink/red/brown urine, vomiting blood or vomit that looks like "coffee grounds", red or black stools (looks like tar), coughing up blood or blood clots ?----------------------------------------------------------------------------------------------------------------------  ? ? ? ?When you get home today take the following medications, then resume you  nightly medication. Remember to switch Plavix (clopidogrel ) and aspirin to the morning. ?Alprazolam (xanax),   Bupropion (Wellbutrin),    Neurontin (gabapentin),   Insulin lispro qwickpen Humalog with evening meal,  Multivitamin, Ocuvite vitamin, VitaminB12 and VitaminB6.    ?

## 2021-04-21 NOTE — TOC Benefit Eligibility Note (Signed)
Patient Advocate Encounter ? ?Insurance verification completed.   ? ?The patient is currently admitted and upon discharge could be taking Farxiga 10 mg. ? ?The current 30 day co-pay is, $47.00.  ? ?The patient is currently admitted and upon discharge could be taking Jardiance 10 mg. ? ?The current 30 day co-pay is, $47.00.  ? ?The patient is insured through [employer] Part D  ? ? ? ?Lyndel Safe, CPhT ?Pharmacy Patient Advocate Specialist ?[place] Patient Advocate Team ?Direct Number: [phone]  Fax: [phone] ? ? ? ? ? ?  ?

## 2021-04-22 MED FILL — Nitroglycerin IV Soln 100 MCG/ML in D5W: INTRA_ARTERIAL | Qty: 10 | Status: AC

## 2021-04-28 ENCOUNTER — Telehealth (HOSPITAL_COMMUNITY): Payer: Self-pay

## 2021-04-28 NOTE — Telephone Encounter (Signed)
Per phase I cardiac rehab, fax cardiac rehab referral to [place] [facility]. 

## 2021-05-03 NOTE — Telephone Encounter (Signed)
error 

## 2021-05-04 NOTE — Progress Notes (Signed)
?  ? ? ?Date:  05/05/2021  ? ?ID:  [name] [name], DOB 11-08-1945, [name] [id number]   ?Provider location:  Advanced Heart Failure ?Type of Visit: Established patient  ? ?PCP:  [name] Late, MD  ?HF Cardiologist:  Dr. [name] ? ?Chief Complaint: Follow up for CAD and CHF. ?  ?History of Present Illness: ?[name] [name] is a 76 y.o. female who has a history of CAD s/p CABG, diastolic CHF, and cerebrovascular disease with history of CVA presents for followup of CHF and diastolic CHF. She had CABG x 5 in 12/11.  Prior to the CABG she had multiple PCIs.  She had a CVA in 2/14 that presented as visual loss.  She had a left carotid stent in 2/15.  ?  ?Left heart cath done in Sept. 2014. This showed patent SVG-D, LIMA-LAD, and sequential SVG-OM branches but SVG-RCA and the native RCA were both totally occluded. It was felt aht her increased symptoms coincided with occlusion of SVG-RCA.  She was started on Imdur to see if this would help with the chest pain and dyspnea.  However, she feels like Imdur causes leg cramps and does not think that she can take it.  So ranolazine 500 mg bid started and titrated this up to 1000 mg bid.  This helped some but not markedly.  Therefore, sent to  Dr. Irish Lack to address opening RCA CTO. He was able to do this in 5/15 with 4 overlapping Xience DES.  This led to resolution of exertional chest pain.   ?  ?[name] [name] was started on Brilinta after PCI.  She became much more short of breath after starting on Brilinta. Per Dr Delano Metz stopped and replaced with Plavix.  Dyspnea improved off Brilinta. Given increasing exertional dyspnea and chest pain,  RHC/LHC was done in 4/17.  This showed stable CAD with no interventional target.  Left and right heart filling pressures were not significantly increased.  Medical management.  ?  ?She had an MRI/MRA in May 2018 that showed moderately severe stenosis of the supraclinoid segment of the right internal carotid artery, progressed since  the 2016 MR angiogram and severe basilar artery stenosis.  ?  ?She developed recurrent exertional chest pain and had LHC in 6/18, showing 4/5 grafts patent with patent native RCA (similar to prior cath, no changes).   ?  ?Echo 1/19 with EF 55-60%, moderate diastolic dysfunction, PASP 50 mmHg, mildly dilated RV, mild AS, mild-moderate MR.  ?  ?She reported increased dyspnea and chest pain and had RHC/LHC in 9/19.  She had DES to sequential SVG-OM1/PLOM.  While hospitalized, she was noted to have runs of atrial fibrillation.  She was very symptomatic with the atrial fibrillation.  Eliquis and amiodarone were started.  ?  ?She was admitted 12/16-12/19/19 for cardiomems implant and RHC. She was diuresed with IV lasix and transitioned to torsemide 100 mg BID + metolazone once/week. DC weight: 295 lbs. ?  ?She had a prolonged hospitalization in 2/20 with acute respiratory failure and fever to 103.  She was thought to have PNA and treated with cefepime.  She was intubated.  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  Amiodarone was stopped and she was put on prednisone. Echo in 2/20 showed EF down to 25-30% with mid-apical LV severe hypokinesis. She had AKI. Possible ITP triggered by infection, HIT negative.  ITP was treated with Solumedrol. She was discharged to SNF.   ? ?ORIF left ankle 4/20.  ? ?  11/20 admission initially with concern for TIAs (staring spells), ended up thinking possible partial seizures.  Head MRI did not show CVA.  Echo in 11/20 showed EF 45-50%, moderately decreased RV systolic function, moderate RV enlargement, moderate-severe MR, moderate TR.  ? ?Patient was admitted again in 12/20 with AKI and hypotension in the setting of sinus bradycardia (HR 30s).  She was volume overloaded on exam.  Toprol XL was stopped and HR increased to 60s.  She was diuresed with IV Lasix.  TEE in 12/20 showed EF 50-55%, septal-lateral dyssynchrony, mildly decreased RV function, moderate MR.   ? ?Atypical chest pain in 1/21, Cardiolite showed EF 57%, fixed inferior defect (likely artifact), no ischemia.  ? ?Echo in 5/22 showed EF 60-65%, moderate LVH, normal RV, PASP 37 mmHg, 2.5 x 2.1 cm calcified mass posterior mitral annulus with mild-moderate MR => ?severe MAC but more circumscribed and prominent than in the past, mild AS mean gradient 12 mmHg and AVA 1.83 cm^2.  Echo in 11/22 showed EF 55%, normal RV, mass posterior mitral annulus, moderate MR, mild AS, IVC normal.  Cardiac MRI was done in 1/23 to investigate posterior mitral annulus mass; study showed LV EF 48%, mild LV dilation, normal RV with EF 53%, extensive MAC is likely the source of the posterior mitral annulus mass, basal-mid inferior >50% subendocardial LGE suggestive of prior inferior MI.  ? ?Follow up 2/23 she had chest tightness x 4 weeks. Arranged for [facility] which showed in-stent restenosis of pRCA, felt source of her symptoms given occluded SVG-RCA. Underwent PCI to pRCA. RHC with normal PCWP and RA pressure, mild pulmonary hypertension, preserved CO. Will be on ASA + Plavix + Eliquis x 1 month (05/21/21), then Plavix + Eliquis.  ? ?Today she returns for post catheterization HF follow up. She continues to have dull ache in chest and is coughing more. She took a dose of SL nitro last week.  Remains dizzy. She has dyspnea walking on flat ground for longer distances. She chronically sleeps reclined. Continues to feel palpitations. Denies abnormal bleeding, or edema. Appetite ok. No fever or chills. Weight at home 217 pounds. Taking all medications. Had some high BPs this past week, 180-190s/80-90s. Husband had TKR and she has been living alone for past 10 weeks. She has been taking torsemide 20 mg qod. ? ?ECG (personally reviewed): SB, LBBB QRS 174 msec ? ?Cardiomems: PADP 5 (goal 10) ? ?Labs (6/14): K 3.8, creatinine 0.71 ?Labs (8/14): BNP 249, LDL 166 ?Labs (9/14): K 4.7, creatinine 0.7 ?Labs (9/14): K 4.6, creatinine 0.9, BNP 109 ?Labs  (1/15): K 4.5, creatinine 0.73, LDL 212, HCT 43.4, TSH normal, BNP 138 ?Labs (6/15): K 4.7, creatinine 1.17, LDL 100, HDL 37, TGs 363, BNP 39 ?Labs (10/15): K 4.6, creatinine 1.2 ?Labs (1/16): LDL 157, HDL 43, K 5.2, creatinine 0.95 ?Labs (2/16): K 4.5, creatinine 1.08, BNP 113 ?Labs (4/16): LDL 50, HDL 56, TGs 179 ?Labs (9/16): K 5.3, creatinine 1.09 ?Labs (10/16): LDL 75, HDL 56 ?Labs (2/17): K 5.2, creatinine 1.07 ?Labs (4/17): K 5.1, creatinine 1.07 ?Labs (5/17): K 4.5, creatinine 1.01, LDL 96, HDL 56, BNP 70 ?Labs (03/2016) K 4.9, creatinine 1.07 ?Labs (7/18): K 4.9, creatinine 1.15 ?Labs (11/18): K 5.1, creatinine 1.11, LDL 36 ?Labs (1/19): K 4.9, creatinine 1.22 ?Labs (9/19): LDL 108 ?Labs (10/19): hgb 14.5, K 4.3, creatinine 1.03 ?Labs 12/06/2017: K 4.7 Creatinine 1.7 ?Labs (3/20): K 4.8, creatinine 1.39, hgb 12.4 plts 226 ?Labs (11/20): K 4.2, creatinine 1.01, LDL 38 ?Labs (12/20):  K 3.5, creatinine 1.8 ?Labs (3/21): K 4.5, creatinine 1.5 ?Labs (2/22): K 4.2, creatinine 1.13 ?Labs (4/22): LDL 58 ?Labs (9/22): K 4.3, creatinine 1.27 ?Labs (12/22): K 4.4, creatinine 1.13 ?Labs (3/23): K 4.3, creatinine 1.02 ?  ?PMH: ?1. Diabetic gastroparesis ?2. Type II diabetes ?3. HTN ?4. Morbid obesity ?5. CAD: s/p CABG in 12/11 after prior PCIs.  LIMA-LAD, SVG-D, seq SVG-OM1 and OM2, SVG-PDA. Adenosine Cardiolite (8/14) with EF 53% and a small reversible apical defect with a medium, partially reversible inferior defect.  LHC (9/14) with patent SVG-D, LIMA-LAD (50% distal LAD), and sequential SVG-OM branches; the SVG-RCA and the native RCA were occluded.  This was managed medically initially, but with ongoing exertional chest pain, it was decided to open CTO.  Patient had CTO opening with 4 overlapping Xience DES in the RCA in 5/15.   ?- LHC (4/17): SVG-D patent, LIMA-LAD patent, distal LAD with several 40-50% stenoses, sequential SVG-OM1 and PLOM patent with 50% proximal stenosis (not flow limiting), SVG-RCA TO with patent  RCA stents.  ?- LHC (6/18): 4/5 grafts patent (SVG-RCA TO, same as past), RCA stents patent => no change.  ?- LHC (9/19): Sequential SVG-PLOM/OM1 with 80% proximal stenosis, s/p DES.  ?- Cardiolite (1/21

## 2021-05-05 ENCOUNTER — Ambulatory Visit (HOSPITAL_COMMUNITY)
Admission: RE | Admit: 2021-05-05 | Discharge: 2021-05-05 | Disposition: A | Discharge status: Home / Self Care | Payer: Medicare Other | Source: Ambulatory Visit | Attending: Family Medicine | Admitting: Family Medicine

## 2021-05-05 ENCOUNTER — Encounter (HOSPITAL_COMMUNITY): Payer: Self-pay

## 2021-05-05 VITALS — BP 142/62 | HR 51 | Wt 220.4 lb

## 2021-05-05 DIAGNOSIS — I252 Old myocardial infarction: Secondary | ICD-10-CM | POA: Insufficient documentation

## 2021-05-05 DIAGNOSIS — I480 Paroxysmal atrial fibrillation: Secondary | ICD-10-CM | POA: Diagnosis not present

## 2021-05-05 DIAGNOSIS — Z7901 Long term (current) use of anticoagulants: Secondary | ICD-10-CM | POA: Insufficient documentation

## 2021-05-05 DIAGNOSIS — I130 Hypertensive heart and chronic kidney disease with heart failure and stage 1 through stage 4 chronic kidney disease, or unspecified chronic kidney disease: Secondary | ICD-10-CM | POA: Insufficient documentation

## 2021-05-05 DIAGNOSIS — I10 Essential (primary) hypertension: Secondary | ICD-10-CM

## 2021-05-05 DIAGNOSIS — Z79899 Other long term (current) drug therapy: Secondary | ICD-10-CM | POA: Insufficient documentation

## 2021-05-05 DIAGNOSIS — Z8673 Personal history of transient ischemic attack (TIA), and cerebral infarction without residual deficits: Secondary | ICD-10-CM

## 2021-05-05 DIAGNOSIS — Z955 Presence of coronary angioplasty implant and graft: Secondary | ICD-10-CM | POA: Insufficient documentation

## 2021-05-05 DIAGNOSIS — I5022 Chronic systolic (congestive) heart failure: Secondary | ICD-10-CM | POA: Diagnosis not present

## 2021-05-05 DIAGNOSIS — Z09 Encounter for follow-up examination after completed treatment for conditions other than malignant neoplasm: Secondary | ICD-10-CM | POA: Insufficient documentation

## 2021-05-05 DIAGNOSIS — I25118 Atherosclerotic heart disease of native coronary artery with other forms of angina pectoris: Secondary | ICD-10-CM | POA: Insufficient documentation

## 2021-05-05 DIAGNOSIS — R002 Palpitations: Secondary | ICD-10-CM | POA: Diagnosis not present

## 2021-05-05 DIAGNOSIS — I5042 Chronic combined systolic (congestive) and diastolic (congestive) heart failure: Secondary | ICD-10-CM | POA: Insufficient documentation

## 2021-05-05 DIAGNOSIS — I447 Left bundle-branch block, unspecified: Secondary | ICD-10-CM | POA: Insufficient documentation

## 2021-05-05 DIAGNOSIS — G4733 Obstructive sleep apnea (adult) (pediatric): Secondary | ICD-10-CM | POA: Diagnosis not present

## 2021-05-05 DIAGNOSIS — I272 Pulmonary hypertension, unspecified: Secondary | ICD-10-CM | POA: Diagnosis not present

## 2021-05-05 DIAGNOSIS — E785 Hyperlipidemia, unspecified: Secondary | ICD-10-CM | POA: Diagnosis not present

## 2021-05-05 DIAGNOSIS — R001 Bradycardia, unspecified: Secondary | ICD-10-CM | POA: Diagnosis not present

## 2021-05-05 DIAGNOSIS — N189 Chronic kidney disease, unspecified: Secondary | ICD-10-CM | POA: Insufficient documentation

## 2021-05-05 DIAGNOSIS — Z951 Presence of aortocoronary bypass graft: Secondary | ICD-10-CM | POA: Insufficient documentation

## 2021-05-05 DIAGNOSIS — Z9889 Other specified postprocedural states: Secondary | ICD-10-CM | POA: Diagnosis not present

## 2021-05-05 DIAGNOSIS — R059 Cough, unspecified: Secondary | ICD-10-CM | POA: Diagnosis not present

## 2021-05-05 DIAGNOSIS — R0789 Other chest pain: Secondary | ICD-10-CM | POA: Diagnosis present

## 2021-05-05 DIAGNOSIS — E1122 Type 2 diabetes mellitus with diabetic chronic kidney disease: Secondary | ICD-10-CM | POA: Insufficient documentation

## 2021-05-05 DIAGNOSIS — I080 Rheumatic disorders of both mitral and aortic valves: Secondary | ICD-10-CM | POA: Insufficient documentation

## 2021-05-05 DIAGNOSIS — I2582 Chronic total occlusion of coronary artery: Secondary | ICD-10-CM | POA: Insufficient documentation

## 2021-05-05 DIAGNOSIS — R42 Dizziness and giddiness: Secondary | ICD-10-CM | POA: Insufficient documentation

## 2021-05-05 DIAGNOSIS — I350 Nonrheumatic aortic (valve) stenosis: Secondary | ICD-10-CM

## 2021-05-05 DIAGNOSIS — Z9981 Dependence on supplemental oxygen: Secondary | ICD-10-CM | POA: Diagnosis not present

## 2021-05-05 DIAGNOSIS — Z7902 Long term (current) use of antithrombotics/antiplatelets: Secondary | ICD-10-CM | POA: Insufficient documentation

## 2021-05-05 DIAGNOSIS — I25119 Atherosclerotic heart disease of native coronary artery with unspecified angina pectoris: Secondary | ICD-10-CM

## 2021-05-05 DIAGNOSIS — I340 Nonrheumatic mitral (valve) insufficiency: Secondary | ICD-10-CM

## 2021-05-05 DIAGNOSIS — Z6841 Body Mass Index (BMI) 40.0 and over, adult: Secondary | ICD-10-CM | POA: Insufficient documentation

## 2021-05-05 DIAGNOSIS — Z7982 Long term (current) use of aspirin: Secondary | ICD-10-CM | POA: Insufficient documentation

## 2021-05-05 LAB — CBC
HCT: 42.7 % (ref 36.0–46.0)
Hemoglobin: 14.7 g/dL (ref 12.0–15.0)
MCH: 32.7 pg (ref 26.0–34.0)
MCHC: 34.4 g/dL (ref 30.0–36.0)
MCV: 95.1 fL (ref 80.0–100.0)
Platelets: 164 10*3/uL (ref 150–400)
RBC: 4.49 MIL/uL (ref 3.87–5.11)
RDW: 12.9 % (ref 11.5–15.5)
WBC: 8 10*3/uL (ref 4.0–10.5)
nRBC: 0 % (ref 0.0–0.2)

## 2021-05-05 LAB — BASIC METABOLIC PANEL
Anion gap: 11 (ref 5–15)
BUN: 25 mg/dL — ABNORMAL HIGH (ref 8–23)
CO2: 23 mmol/L (ref 22–32)
Calcium: 9.3 mg/dL (ref 8.9–10.3)
Chloride: 108 mmol/L (ref 98–111)
Creatinine, Ser: 1.05 mg/dL — ABNORMAL HIGH (ref 0.44–1.00)
GFR, Estimated: 56 mL/min — ABNORMAL LOW (ref >60)
Glucose, Bld: 149 mg/dL — ABNORMAL HIGH (ref 70–99)
Potassium: 4.9 mmol/L (ref 3.5–5.1)
Sodium: 142 mmol/L (ref 135–145)

## 2021-05-05 MED ORDER — NITROGLYCERIN 0.3 MG SL SUBL
0.3000 mg | SUBLINGUAL_TABLET | SUBLINGUAL | 1 refills | Status: DC | PRN
Start: 2021-05-05 — End: 2021-10-27

## 2021-05-05 MED ORDER — ASPIRIN EC 81 MG PO TBEC: 81.0000 mg | DELAYED_RELEASE_TABLET | Freq: Every day | ORAL | 0 refills | Status: AC

## 2021-05-05 MED ORDER — ISOSORBIDE MONONITRATE ER 30 MG PO TB24: 15.0000 mg | ORAL_TABLET | Freq: Every day | ORAL | 11 refills | Status: DC

## 2021-05-05 MED ORDER — TORSEMIDE 20 MG PO TABS: 10.0000 mg | ORAL_TABLET | ORAL | 2 refills | Status: DC

## 2021-05-05 NOTE — Patient Instructions (Addendum)
EKG done today. ? ?Labs done today. We will contact you only if your labs are abnormal. ? ?DECREASE Torsemide to '10mg'$  (1/2 tablet) by mouth every other day. ? ?START Imdur '15mg'$  (1/2 tablet) by mouth daily. ?  ?STOP Aspirin on 05/21/21. ? ?No other medication changes were made. Please continue all current medications as prescribed. ? ?You have been referred to Cardiac Rehab. They will contact you to schedule an appointment.  ? ?Your physician recommends that you schedule a follow-up appointment in: 2 months with our NP/PA Clinic here in our office and in 4 months with Dr. [name]. Please contact our office in June to schedule a August appointment.  ? ?If you have any questions or concerns before your next appointment please send [place] a message through [place] or call our office at [phone].   ? ?TO LEAVE A MESSAGE FOR THE NURSE SELECT OPTION 2, PLEASE LEAVE A MESSAGE INCLUDING: ?YOUR NAME ?DATE OF BIRTH ?CALL BACK NUMBER ?REASON FOR CALL**this is important as we prioritize the call backs ? ?YOU WILL RECEIVE A CALL BACK THE SAME DAY AS LONG AS YOU CALL BEFORE 4:00 PM ? ? ?Do the following things EVERYDAY: ?Weigh yourself in the morning before breakfast. Write it down and keep it in a log. ?Take your medicines as prescribed ?Eat low salt foods--Limit salt (sodium) to 2000 mg per day.  ?Stay as active as you can everyday ?Limit all fluids for the day to less than 2 liters ? ? ?At the [facility], you and your health needs are our priority. As part of our continuing mission to provide you with exceptional heart care, we have created designated Provider Care Teams. These Care Teams include your primary Cardiologist (physician) and Advanced Practice Providers (APPs- Physician Assistants and Nurse Practitioners) who all work together to provide you with the care you need, when you need it.  ? ?You may see any of the following providers on your designated Care Team at your next follow up: ?Dr Glori Bickers ?Dr Loralie Champagne ?Darrick Grinder, NP ?Lyda Jester, PA ?Audry Riles, PharmD ? ? ?Please be sure to bring in all your medications bottles to every appointment.  ? ?

## 2021-06-01 DIAGNOSIS — I509 Heart failure, unspecified: Secondary | ICD-10-CM | POA: Diagnosis not present

## 2021-06-01 DIAGNOSIS — Z79899 Other long term (current) drug therapy: Secondary | ICD-10-CM | POA: Insufficient documentation

## 2021-06-01 DIAGNOSIS — I130 Hypertensive heart and chronic kidney disease with heart failure and stage 1 through stage 4 chronic kidney disease, or unspecified chronic kidney disease: Secondary | ICD-10-CM | POA: Insufficient documentation

## 2021-06-01 DIAGNOSIS — Z794 Long term (current) use of insulin: Secondary | ICD-10-CM | POA: Diagnosis not present

## 2021-06-01 DIAGNOSIS — Z7901 Long term (current) use of anticoagulants: Secondary | ICD-10-CM | POA: Insufficient documentation

## 2021-06-01 DIAGNOSIS — T383X1A Poisoning by insulin and oral hypoglycemic [antidiabetic] drugs, accidental (unintentional), initial encounter: Secondary | ICD-10-CM | POA: Insufficient documentation

## 2021-06-01 DIAGNOSIS — E162 Hypoglycemia, unspecified: Secondary | ICD-10-CM | POA: Diagnosis present

## 2021-06-01 DIAGNOSIS — E1122 Type 2 diabetes mellitus with diabetic chronic kidney disease: Secondary | ICD-10-CM | POA: Diagnosis not present

## 2021-06-01 DIAGNOSIS — N189 Chronic kidney disease, unspecified: Secondary | ICD-10-CM | POA: Diagnosis not present

## 2021-06-01 DIAGNOSIS — E11649 Type 2 diabetes mellitus with hypoglycemia without coma: Secondary | ICD-10-CM | POA: Diagnosis not present

## 2021-06-01 LAB — CBG MONITORING, ED: Glucose-Capillary: 54 mg/dL — ABNORMAL LOW (ref 70–99)

## 2021-06-01 MED ORDER — GLUCOSE 40 % PO GEL
1.0000 | Freq: Once | ORAL | Status: AC
  Administered 2021-06-01: 31 g via ORAL

## 2021-06-01 MED ORDER — GLUCOSE 40 % PO GEL
ORAL | Status: AC
  Administered 2021-06-01: 31 g
  Filled 2021-06-01: qty 1

## 2021-06-01 MED ORDER — GLUCOSE 40 % PO GEL
1.0000 | Freq: Once | ORAL | Status: AC
Start: 2021-06-02 — End: 2021-06-01

## 2021-06-01 MED ORDER — GLUCOSE 40 % PO GEL
ORAL | Status: AC
  Filled 2021-06-01: qty 1

## 2021-06-01 NOTE — ED Notes (Signed)
Pt took 26units of Humolog sq thinking that it was novolog.  Pt ate some peanut butter and came here.  I went to help her from the car in a wheelchair and found that she was weak and shaky and sweaty.  Brought to triage where I checked her CBG and it was 42, gave her 2 packs of glucose and some orange juice.   ?

## 2021-06-01 NOTE — ED Notes (Signed)
Attempt iv placement x2 without success after 2 oral glucose were given ?

## 2021-06-02 ENCOUNTER — Encounter (HOSPITAL_BASED_OUTPATIENT_CLINIC_OR_DEPARTMENT_OTHER): Payer: Self-pay | Admitting: *Deleted

## 2021-06-02 ENCOUNTER — Other Ambulatory Visit: Payer: Self-pay

## 2021-06-02 ENCOUNTER — Emergency Department (HOSPITAL_BASED_OUTPATIENT_CLINIC_OR_DEPARTMENT_OTHER)
Admission: EM | Admit: 2021-06-02 | Discharge: 2021-06-02 | Disposition: A | Discharge status: Home / Self Care | Payer: Medicare Other | Attending: Emergency Medicine | Admitting: Emergency Medicine

## 2021-06-02 DIAGNOSIS — N289 Disorder of kidney and ureter, unspecified: Secondary | ICD-10-CM

## 2021-06-02 DIAGNOSIS — Z7901 Long term (current) use of anticoagulants: Secondary | ICD-10-CM

## 2021-06-02 DIAGNOSIS — T383X1A Poisoning by insulin and oral hypoglycemic [antidiabetic] drugs, accidental (unintentional), initial encounter: Secondary | ICD-10-CM

## 2021-06-02 DIAGNOSIS — E162 Hypoglycemia, unspecified: Secondary | ICD-10-CM

## 2021-06-02 LAB — CBC WITH DIFFERENTIAL/PLATELET
Abs Immature Granulocytes: 0.02 10*3/uL (ref 0.00–0.07)
Basophils Absolute: 0.1 10*3/uL (ref 0.0–0.1)
Basophils Relative: 1 %
Eosinophils Absolute: 0.5 10*3/uL (ref 0.0–0.5)
Eosinophils Relative: 5 %
HCT: 44.1 % (ref 36.0–46.0)
Hemoglobin: 14.8 g/dL (ref 12.0–15.0)
Immature Granulocytes: 0 %
Lymphocytes Relative: 29 %
Lymphs Abs: 2.8 10*3/uL (ref 0.7–4.0)
MCH: 32.4 pg (ref 26.0–34.0)
MCHC: 33.6 g/dL (ref 30.0–36.0)
MCV: 96.5 fL (ref 80.0–100.0)
Monocytes Absolute: 0.8 10*3/uL (ref 0.1–1.0)
Monocytes Relative: 8 %
Neutro Abs: 5.6 10*3/uL (ref 1.7–7.7)
Neutrophils Relative %: 57 %
Platelets: 173 10*3/uL (ref 150–400)
RBC: 4.57 MIL/uL (ref 3.87–5.11)
RDW: 12.8 % (ref 11.5–15.5)
WBC: 9.8 10*3/uL (ref 4.0–10.5)
nRBC: 0 % (ref 0.0–0.2)

## 2021-06-02 LAB — CBG MONITORING, ED
Glucose-Capillary: 114 mg/dL — ABNORMAL HIGH (ref 70–99)
Glucose-Capillary: 117 mg/dL — ABNORMAL HIGH (ref 70–99)
Glucose-Capillary: 133 mg/dL — ABNORMAL HIGH (ref 70–99)
Glucose-Capillary: 171 mg/dL — ABNORMAL HIGH (ref 70–99)
Glucose-Capillary: 196 mg/dL — ABNORMAL HIGH (ref 70–99)
Glucose-Capillary: 263 mg/dL — ABNORMAL HIGH (ref 70–99)
Glucose-Capillary: 42 mg/dL — CL (ref 70–99)

## 2021-06-02 LAB — BASIC METABOLIC PANEL
Anion gap: 6 (ref 5–15)
BUN: 33 mg/dL — ABNORMAL HIGH (ref 8–23)
CO2: 25 mmol/L (ref 22–32)
Calcium: 9 mg/dL (ref 8.9–10.3)
Chloride: 109 mmol/L (ref 98–111)
Creatinine, Ser: 1.33 mg/dL — ABNORMAL HIGH (ref 0.44–1.00)
GFR, Estimated: 42 mL/min — ABNORMAL LOW (ref >60)
Glucose, Bld: 54 mg/dL — ABNORMAL LOW (ref 70–99)
Potassium: 4.3 mmol/L (ref 3.5–5.1)
Sodium: 140 mmol/L (ref 135–145)

## 2021-06-02 MED ORDER — DEXTROSE 50 % IV SOLN
1.0000 | Freq: Once | INTRAVENOUS | Status: AC
  Administered 2021-06-02: 50 mL via INTRAVENOUS

## 2021-06-02 NOTE — ED Notes (Signed)
Pt agreeable with d/c plan as discussed by provider - this nurse has verbally reinforced d/c instructions and provided pt with written copy- pt acknowledges verbal understanding and denies any additional questions, concerns, needs- escorted to car via w/c (spouse is driver)   ?

## 2021-06-02 NOTE — Discharge Instructions (Signed)
Make sure that you are taking the appropriate dose of each insulin for each dose that you take. ? ?Your creatinine (blood test of your kidney) is slightly worse than it had been recently.  Please follow-up with your primary care provider to repeat the test to make sure your kidneys are not getting worse. ?

## 2021-06-02 NOTE — ED Notes (Signed)
Pt reports she is feeling more shaky again ?

## 2021-06-02 NOTE — ED Notes (Signed)
Blood sent to lab

## 2021-06-02 NOTE — ED Notes (Addendum)
Dr Roxanne Mins updated on most recent CBG 117 - decreased from 171 CBG taken 47mnutes prior - Pt remains awake and alert in bed-- now eating more snacks (cookers/graham crackers) provided upon arrival to room  ?

## 2021-06-02 NOTE — ED Provider Notes (Signed)
?[facility] ?Provider Note ? ? ?CSN: [id number] ?Arrival date & time: 06/01/21  2336 ? ?  ? ?History ? ?Chief Complaint  ?Patient presents with  ? Hypoglycemia  ? ? ?[name] [name] is a 76 y.o. female. ? ?The history is provided by the patient.  ?Hypoglycemia ?She has history of hypertension, diabetes, hyperlipidemia, chronic kidney disease, stroke, heart failure, atrial fibrillation anticoagulated on apixaban and comes in after an accidental overdose of Humalog.  She states that about 1030, she mistakenly took 20 units of Humalog instead of 10 units which she is supposed to take.  The 20 units is the normal dose for her Lantus insulin.  She did start to get sweaty and weak and came to the emergency department.  At triage, she was noted to be pale and diaphoretic and was immediately given oral glucose. ?  ?Home Medications ?Prior to Admission medications   ?Medication Sig Start Date End Date Taking? Authorizing Provider  ?acetaminophen (TYLENOL) 500 MG tablet Take 500 mg by mouth every 6 (six) hours as needed for mild pain, moderate pain, fever or headache.    [provider]  ?albuterol (VENTOLIN HFA) 108 (90 Base) MCG/ACT inhaler Inhale 2 puffs into the lungs every 4 (four) hours as needed for wheezing or shortness of breath.     [provider]  ?Alirocumab (PRALUENT) 150 MG/ML SOAJ Inject 150 mg/mL into the skin every 14 (fourteen) days.    [provider]  ?ALPRAZolam Duanne Moron) 0.5 MG tablet Take 1-2 tablets (0.5-1 mg total) by mouth See admin instructions. Take one tablet (0.5 mg) by mouth every morning and two tablets (1 mg) at night, may also take 1 tablet (0.5 mg) midday as needed for anxiety 01/19/19   Aline August, MD  ?amLODipine (NORVASC) 10 MG tablet Take 10 mg by mouth daily.    [provider]  ?apixaban (ELIQUIS) 5 MG TABS tablet Take 1 tablet (5 mg total) by mouth 2 (two) times daily. 12/29/20   Rafael Bihari, FNP  ?B Complex-C  (B-COMPLEX WITH VITAMIN C) tablet Take 1 tablet by mouth daily.    [provider]  ?bisacodyl (DULCOLAX) 5 MG EC tablet Take 5 mg by mouth daily as needed for moderate constipation.    [provider]  ?buPROPion (WELLBUTRIN XL) 150 MG 24 hr tablet Take 150 mg by mouth every morning. 01/25/19   [provider]  ?cholecalciferol (VITAMIN D3) 25 MCG (1000 UNIT) tablet Take 1,000 Units by mouth daily.    [provider]  ?clopidogrel (PLAVIX) 75 MG tablet Take 75 mg by mouth every morning.    [provider]  ?denosumab (PROLIA) 60 MG/ML SOSY injection Inject 60 mg into the skin every 6 (six) months.    [provider]  ?DULoxetine (CYMBALTA) 30 MG capsule Take 1 capsule (30 mg total) by mouth daily. 09/28/18   Angiulli, Lavon Paganini, PA-C  ?ezetimibe (ZETIA) 10 MG tablet Take 1 tablet (10 mg total) by mouth at bedtime. 04/10/18   Cherene Altes, MD  ?fluocinonide cream (LIDEX) 1.49 % Apply 1 application. topically 2 (two) times daily as needed (irritation). 01/17/21   [provider]  ?Fluticasone-Salmeterol (ADVAIR) 250-50 MCG/DOSE AEPB Inhale 1 puff into the lungs daily.    [provider]  ?gabapentin (NEURONTIN) 600 MG tablet Take 600-1,200 mg by mouth See admin instructions. 600 mg in the morning, 1200 mg at bedtime, and may take an additional dose, 600 mg, throughout the day  if needed 07/04/19   [provider]  ?Insulin Glargine (LANTUS SOLOSTAR) 100 UNIT/ML Solostar Pen Inject 28-44 Units into the skin See admin instructions. Per sliding scale    [provider]  ?insulin lispro (HUMALOG) 100 UNIT/ML KwikPen Inject 14-20 Units into the skin See admin instructions. Inject 14 units subcutaneously prior to breakfast and supper; add 4 units for CBG >200    [provider]  ?ipratropium-albuterol (DUONEB) 0.5-2.5 (3) MG/3ML SOLN Take 3 mLs by nebulization 2 (two) times daily as needed (asthma). 01/19/19   Aline August,  MD  ?isosorbide mononitrate (IMDUR) 30 MG 24 hr tablet Take 0.5 tablets (15 mg total) by mouth daily. 05/05/21 05/05/22  Rafael Bihari, FNP  ?loperamide (IMODIUM A-D) 2 MG tablet Take 2 mg by mouth 4 (four) times daily as needed for diarrhea or loose stools.    [provider]  ?methocarbamol (ROBAXIN) 500 MG tablet Take 500 mg by mouth 3 (three) times daily as needed for muscle spasms. 05/25/19   [provider]  ?Multiple Vitamin (MULTIVITAMIN WITH MINERALS) TABS tablet Take 1 tablet by mouth every morning. Centrum - Women over 29    [provider]  ?Multiple Vitamins-Minerals (OCUVITE EYE HEALTH FORMULA) CAPS Take 1 capsule by mouth every morning.    [provider]  ?nitroGLYCERIN (NITROSTAT) 0.3 MG SL tablet Place 1 tablet (0.3 mg total) under the tongue every 5 (five) minutes x 3 doses as needed for chest pain. 05/05/21   Rafael Bihari, FNP  ?ondansetron (ZOFRAN-ODT) 4 MG disintegrating tablet Take 4 mg by mouth 2 (two) times daily as needed for nausea or vomiting.    [provider]  ?OXYGEN Inhale 2 L into the lungs at bedtime as needed (shortness of breath).     [provider]  ?pantoprazole (PROTONIX) 40 MG tablet Take 1 tablet (40 mg total) by mouth daily. 09/28/18   Angiulli, Lavon Paganini, PA-C  ?Polyvinyl Alcohol-Povidone (REFRESH OP) Place 1 drop into both eyes daily as needed (redness/ dry eyes).    [provider]  ?potassium chloride SA (KLOR-CON) 20 MEQ tablet Take 1 tablet (20 mEq total) by mouth daily. 12/29/20   Rafael Bihari, FNP  ?Pyridoxine HCl (VITAMIN B-6 PO) Take 1 tablet by mouth every morning.    [provider]  ?ranolazine (RANEXA) 1000 MG SR tablet TAKE ONE TABLET BY MOUTH TWICE DAILY 08/06/20   Larey Dresser, MD  ?rosuvastatin (CRESTOR) 40 MG tablet Take 1 tablet (40 mg total) by mouth at bedtime. 09/28/18   Angiulli, Lavon Paganini, PA-C  ?sacubitril-valsartan (ENTRESTO) 97-103 MG Take 1 tablet by mouth 2 (two)  times daily. 03/30/21   Larey Dresser, MD  ?torsemide (DEMADEX) 20 MG tablet Take 0.5 tablets (10 mg total) by mouth every other day. 05/05/21   Rafael Bihari, FNP  ?traZODone (DESYREL) 50 MG tablet Take 1 tablet (50 mg total) by mouth at bedtime. 11/20/19   Suzzanne Cloud, NP  ?vitamin B-12 (CYANOCOBALAMIN) 1000 MCG tablet Take 1,000 mcg by mouth every morning.     [provider]  ?zolpidem (AMBIEN) 10 MG tablet Take 10 mg by mouth at bedtime as needed for sleep.    [provider]  ?   ? ?Allergies    ?Amoxicillin, Brilinta [ticagrelor], Erythromycin, Flagyl [metronidazole], Penicillins, Isosorbide mononitrate [isosorbide nitrate], Jardiance [empagliflozin], Metformin and related, and Tape   ? ?Review of Systems   ?Review of Systems  ?All other systems reviewed and are  negative. ? ?Physical Exam ?Updated Vital Signs ?BP (!) 152/52   Pulse (!) 49   Temp (!) 97.5 ?F (36.4 ?C) (Oral)   Resp 14   Wt 99.8 kg   SpO2 97%   BMI 40.24 kg/m?  ?Physical Exam ?Vitals and nursing note reviewed.  ?76 year old female, resting comfortably and in no acute distress. Vital signs are significant for slow heart rate and elevated blood pressure. Oxygen saturation is 97%, which is normal. ?Head is normocephalic and atraumatic. PERRLA, EOMI. Oropharynx is clear. ?Neck is nontender and supple without adenopathy or JVD. ?Back is nontender and there is no CVA tenderness. ?Lungs are clear without rales, wheezes, or rhonchi. ?Chest is nontender. ?Heart has regular rate and rhythm without murmur. ?Abdomen is soft, flat, nontender without masses or hepatosplenomegaly and peristalsis is normoactive. ?Extremities have trace edema, full range of motion is present. ?Skin is warm and dry without rash. ?Neurologic: Mental status is normal, cranial nerves are intact, moves all extremities equally. ? ?ED Results / Procedures / Treatments   ?Labs ?(all labs ordered are listed, but only abnormal results are displayed) ?Labs  Reviewed  ?BASIC METABOLIC PANEL - Abnormal; Notable for the following components:  ?    Result Value  ? Glucose, Bld 54 (*)   ? BUN 33 (*)   ? Creatinine, Ser 1.33 (*)   ? GFR, Estimated 42 (*)   ? All other

## 2021-06-02 NOTE — ED Notes (Signed)
Pt CBG was 201 when she accidentally took the wrong dose of insulin, it was 42 on arrival to ED and 54 after 2 packs of dextrose (see MAR).  Pt then received one amp of d50% when she had an IV and after receiving that she reported that she was feeling better and she appears less sweaty.  EDP to bedside.  ?

## 2021-06-02 NOTE — ED Notes (Signed)
Patient is resting comfortably.  Pt and family inquired as to how much longer would it be before d/c home;  per Dr Roxanne Mins pt will be monitored for another hour and may be d/c'd home if CBG remains stable; pt and family updated and are agreeable with updated plan   ?

## 2021-06-02 NOTE — ED Notes (Addendum)
CBG 42 ?

## 2021-06-02 NOTE — ED Notes (Signed)
Pt assisted to hall bathroom at this time via w/c -- no acute changes or distress noted.   ?

## 2021-06-02 NOTE — ED Notes (Signed)
Food given 

## 2021-06-19 IMAGING — CR LUMBAR SPINE - COMPLETE 4+ VIEW
5 series · 5 of 5 positions shown · non-contrast
Comparison: None.

CLINICAL DATA: Fall 3 weeks ago.  Low back pain.

EXAM:
LUMBAR SPINE - COMPLETE 4+ VIEW

[t lumbar spine ap]
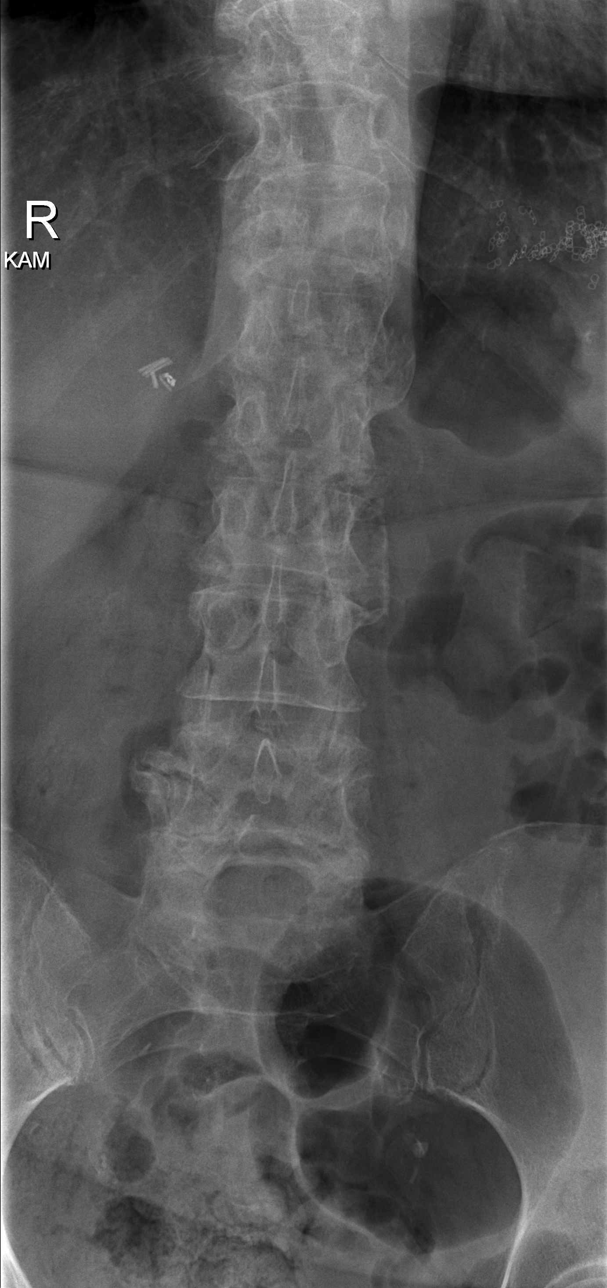

[t lumbar spine obl (1 of 2)]
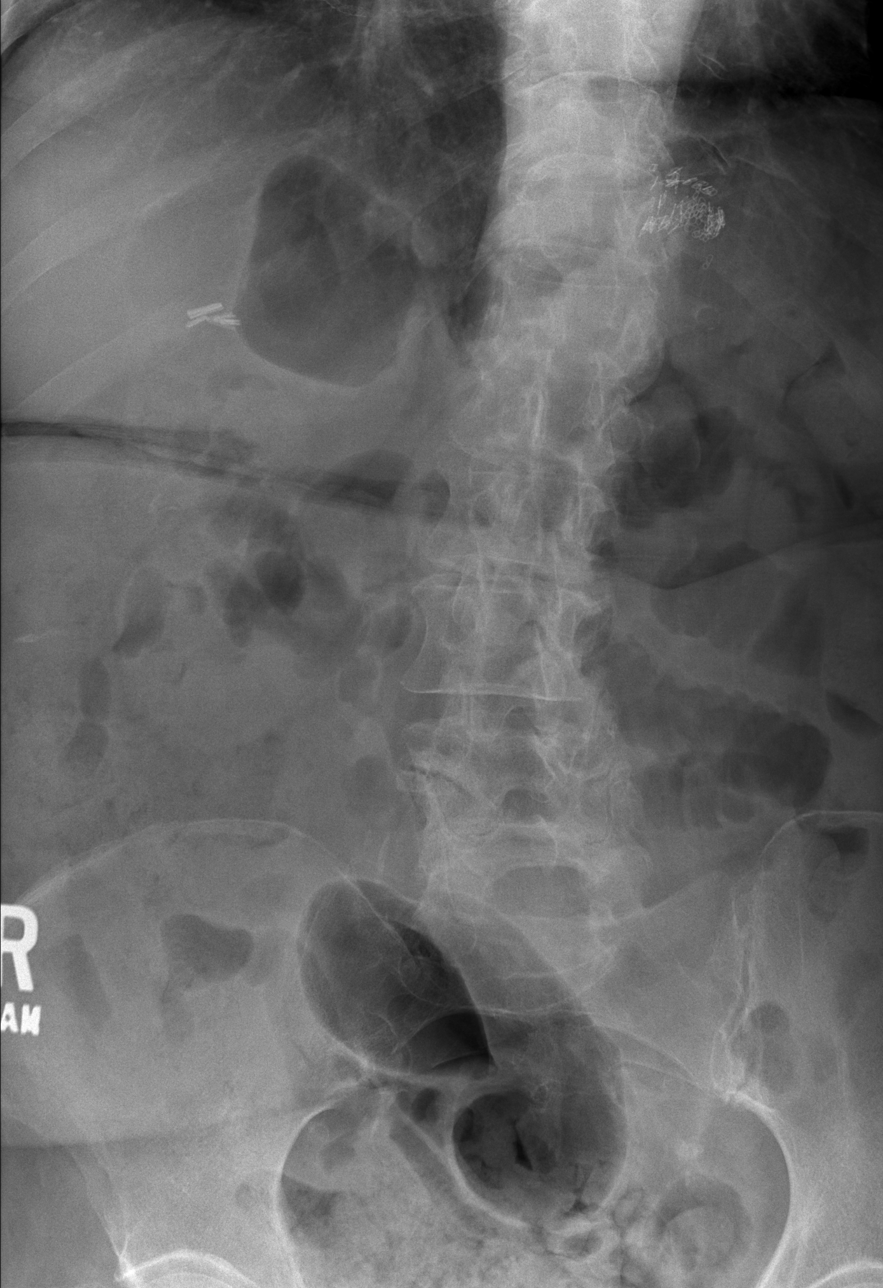

[t lumbar spine obl (2 of 2)]
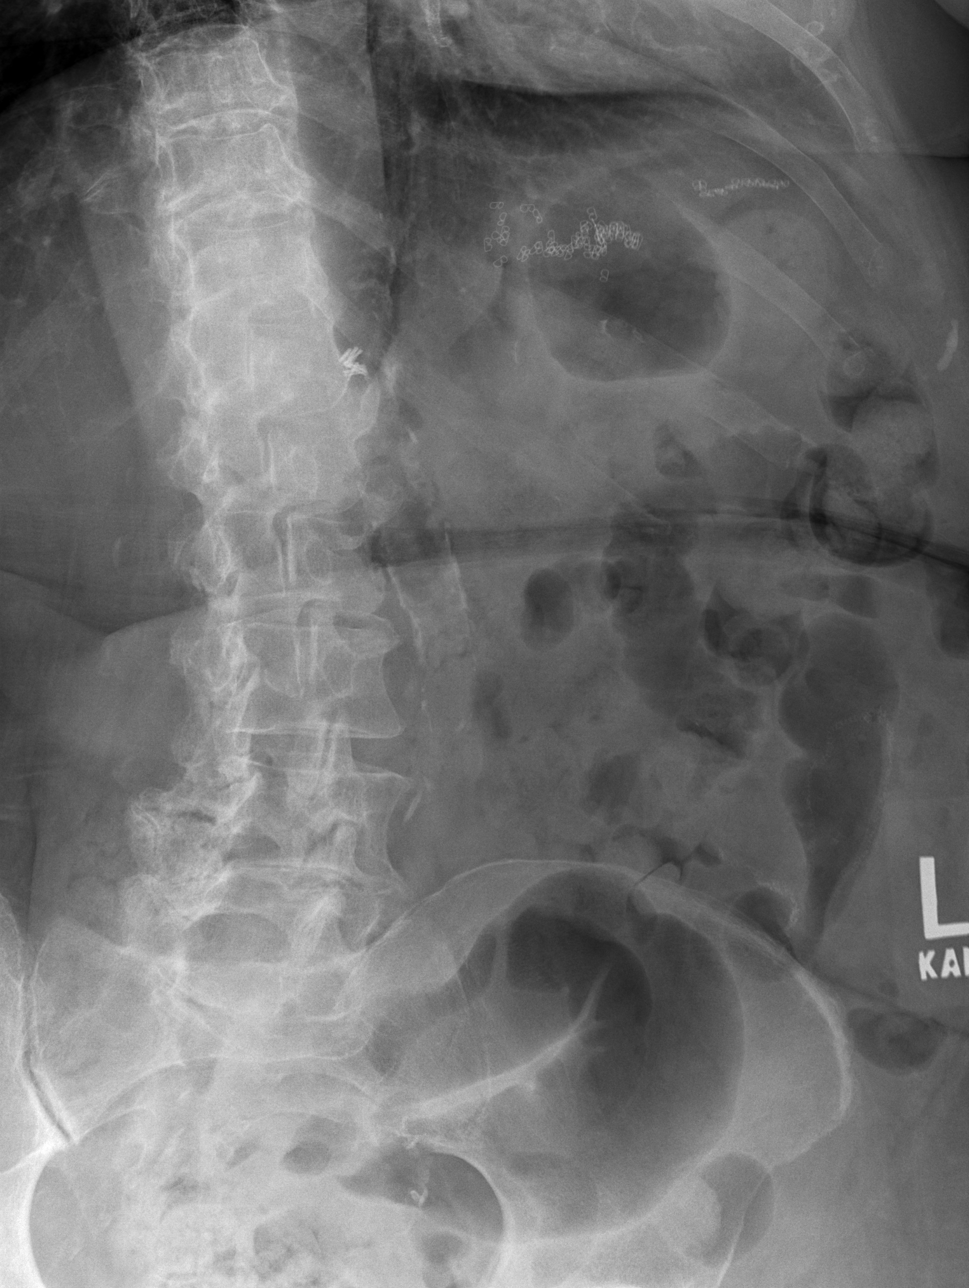

[t lumbar spine lat]
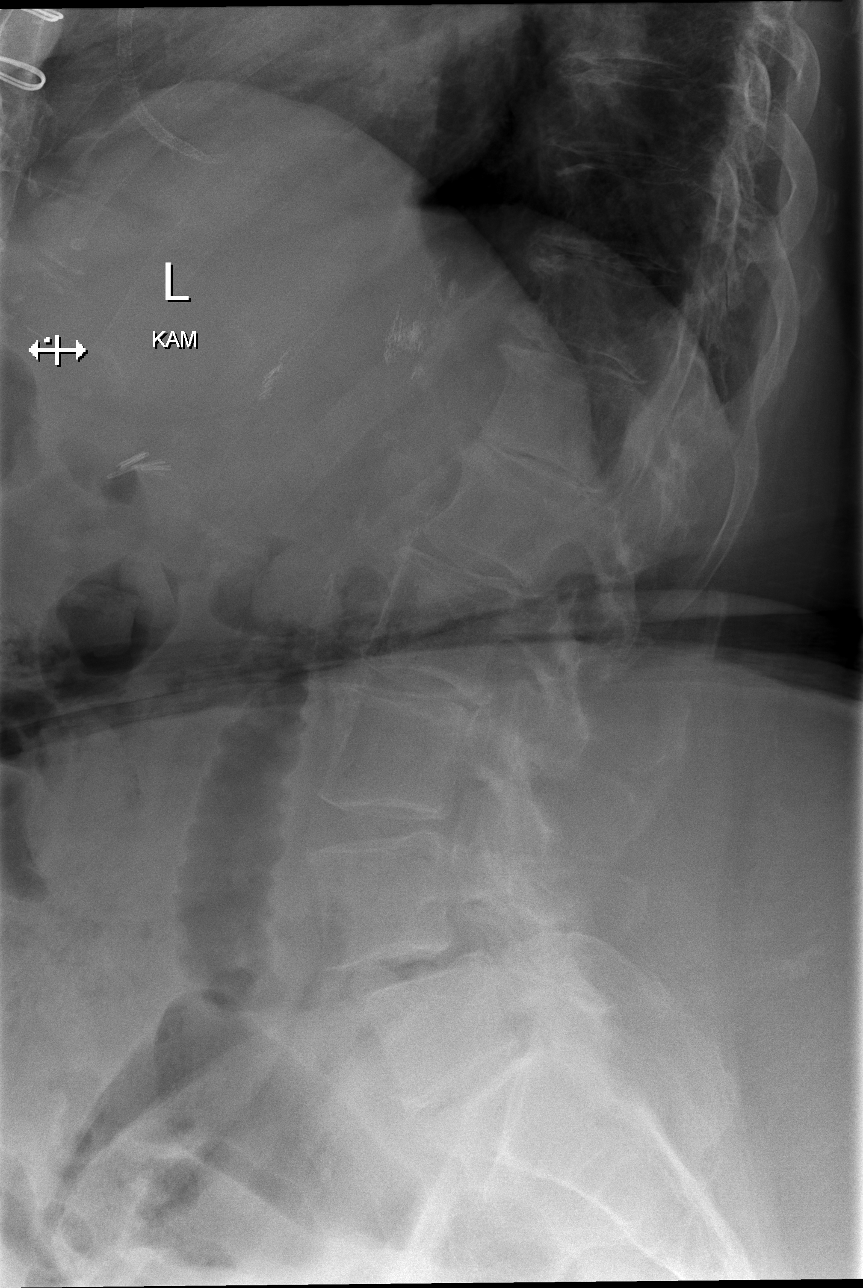

[t lumbar l-5 s-1 spot]
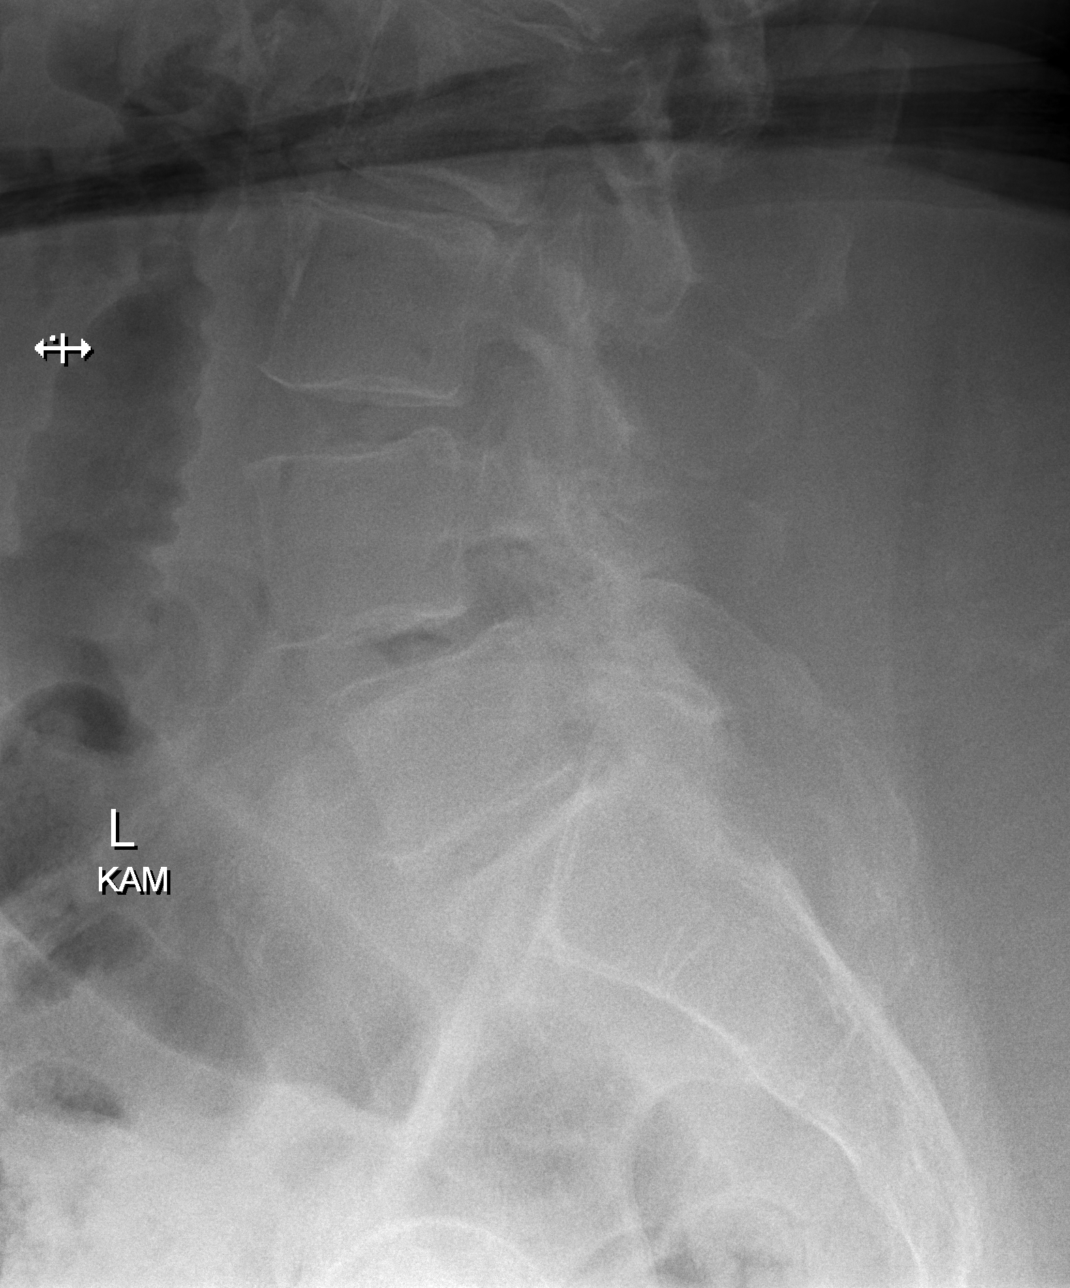

[5 of 5 positions shown; findings below may reference images not displayed]

FINDINGS: Degenerative disc and facet disease throughout the lumbar spine. 9
mm of anterolisthesis of L4 on L5. No fracture. SI joints symmetric
and unremarkable.
IMPRESSION: Degenerative disc and facet disease throughout the lumbar spine with
grade 1 anterolisthesis at L4-5. No acute bony abnormality.

## 2021-07-06 NOTE — Progress Notes (Signed)
Date:  07/07/2021   ID:  [name] [name], DOB [birth year], [name] [id number]   Provider location: [facility] Type of Visit: Established patient   PCP:  Derinda Late, MD  HF Cardiologist:  Dr. [name]  Chief Complaint: Follow up for CAD and CHF.   History of Present Illness: DOTTI [name] is a 76 y.o. female who has a history of CAD s/p CABG, diastolic CHF, and cerebrovascular disease with history of CVA presents for followup of CHF and diastolic CHF. She had CABG x 5 in 12/11.  Prior to the CABG she had multiple PCIs.  She had a CVA in 2/14 that presented as visual loss.  She had a left carotid stent in 2/15.    Left heart cath done in Sept. 2014. This showed patent SVG-D, LIMA-LAD, and sequential SVG-OM branches but SVG-RCA and the native RCA were both totally occluded. It was felt aht her increased symptoms coincided with occlusion of SVG-RCA.  She was started on Imdur to see if this would help with the chest pain and dyspnea.  However, she feels like Imdur causes leg cramps and does not think that she can take it.  So ranolazine 500 mg bid started and titrated this up to 1000 mg bid.  This helped some but not markedly.  Therefore, sent to  Dr. Irish Lack to address opening RCA CTO. He was able to do this in 5/15 with 4 overlapping Xience DES.  This led to resolution of exertional chest pain.     [name] [name] was started on Brilinta after PCI.  She became much more short of breath after starting on Brilinta. Per Dr Delano Metz stopped and replaced with Plavix.  Dyspnea improved off Brilinta. Given increasing exertional dyspnea and chest pain,  RHC/LHC was done in 4/17.  This showed stable CAD with no interventional target.  Left and right heart filling pressures were not significantly increased.  Medical management.    She had an MRI/MRA in May 2018 that showed moderately severe stenosis of the supraclinoid segment of the right internal carotid artery, progressed since  the 2016 MR angiogram and severe basilar artery stenosis.    She developed recurrent exertional chest pain and had LHC in 6/18, showing 4/5 grafts patent with patent native RCA (similar to prior cath, no changes).     Echo 1/19 with EF 55-60%, moderate diastolic dysfunction, PASP 50 mmHg, mildly dilated RV, mild AS, mild-moderate MR.    She reported increased dyspnea and chest pain and had RHC/LHC in 9/19.  She had DES to sequential SVG-OM1/PLOM.  While hospitalized, she was noted to have runs of atrial fibrillation.  She was very symptomatic with the atrial fibrillation.  Eliquis and amiodarone were started.    She was admitted 12/16-12/19/19 for cardiomems implant and RHC. She was diuresed with IV lasix and transitioned to torsemide 100 mg BID + metolazone once/week. DC weight: 295 lbs.   She had a prolonged hospitalization in 2/20 with acute respiratory failure and fever to 103.  She was thought to have PNA and treated with cefepime.  She was intubated.  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  Amiodarone was stopped and she was put on prednisone. Echo in 2/20 showed EF down to 25-30% with mid-apical LV severe hypokinesis. She had AKI. Possible ITP triggered by infection, HIT negative.  ITP was treated with Solumedrol. She was discharged to SNF.    ORIF left ankle 4/20.  11/20 admission initially with concern for TIAs (staring spells), ended up thinking possible partial seizures.  Head MRI did not show CVA.  Echo in 11/20 showed EF 45-50%, moderately decreased RV systolic function, moderate RV enlargement, moderate-severe MR, moderate TR.   Patient was admitted again in 12/20 with AKI and hypotension in the setting of sinus bradycardia (HR 30s).  She was volume overloaded on exam.  Toprol XL was stopped and HR increased to 60s.  She was diuresed with IV Lasix.  TEE in 12/20 showed EF 50-55%, septal-lateral dyssynchrony, mildly decreased RV function, moderate MR.    Atypical chest pain in 1/21, Cardiolite showed EF 57%, fixed inferior defect (likely artifact), no ischemia.   Echo in 5/22 showed EF 60-65%, moderate LVH, normal RV, PASP 37 mmHg, 2.5 x 2.1 cm calcified mass posterior mitral annulus with mild-moderate MR => ?severe MAC but more circumscribed and prominent than in the past, mild AS mean gradient 12 mmHg and AVA 1.83 cm^2.  Echo in 11/22 showed EF 55%, normal RV, mass posterior mitral annulus, moderate MR, mild AS, IVC normal.  Cardiac MRI was done in 1/23 to investigate posterior mitral annulus mass; study showed LV EF 48%, mild LV dilation, normal RV with EF 53%, extensive MAC is likely the source of the posterior mitral annulus mass, basal-mid inferior >50% subendocardial LGE suggestive of prior inferior MI.   Follow up 2/23 she had chest tightness x 4 weeks. Arranged for [facility] which showed in-stent restenosis of pRCA, felt source of her symptoms given occluded SVG-RCA. Underwent PCI to pRCA. RHC with normal PCWP and RA pressure, mild pulmonary hypertension, preserved CO. On ASA + Plavix + Eliquis x 1 month (until 05/21/21), then Plavix + Eliquis.   Today she returns for HF follow up. Seen in the ED last month for hypoglycemia after accidentally giving herself extra insulin. Overall feeling fatigued, struggles getting out of bed sometimes. She remains dizzy, this is chronic. She has dyspnea walking on flat ground with her walker for longer distances. Unable to tolerate re-challenge of Imdur last visit due to headache. COntinues with occasional atypical chest pain. Denies palpitations or abnormal bleeding.  he chronically sleeps reclined. Appetite ok. No fever or chills. Weight at home 224 pounds.  Not regularly using Cardiomems. Continues to have falls every other week or so.  REDs: 34%  ECG (personally reviewed): none ordered today.  Cardiomems: no recent readings.  Labs (6/14): K 3.8, creatinine 0.71 Labs (8/14): BNP 249, LDL 166 Labs (9/14):  K 4.7, creatinine 0.7 Labs (9/14): K 4.6, creatinine 0.9, BNP 109 Labs (1/15): K 4.5, creatinine 0.73, LDL 212, HCT 43.4, TSH normal, BNP 138 Labs (6/15): K 4.7, creatinine 1.17, LDL 100, HDL 37, TGs 363, BNP 39 Labs (10/15): K 4.6, creatinine 1.2 Labs (1/16): LDL 157, HDL 43, K 5.2, creatinine 0.95 Labs (2/16): K 4.5, creatinine 1.08, BNP 113 Labs (4/16): LDL 50, HDL 56, TGs 179 Labs (9/16): K 5.3, creatinine 1.09 Labs (10/16): LDL 75, HDL 56 Labs (2/17): K 5.2, creatinine 1.07 Labs (4/17): K 5.1, creatinine 1.07 Labs (5/17): K 4.5, creatinine 1.01, LDL 96, HDL 56, BNP 70 Labs (03/2016) K 4.9, creatinine 1.07 Labs (7/18): K 4.9, creatinine 1.15 Labs (11/18): K 5.1, creatinine 1.11, LDL 36 Labs (1/19): K 4.9, creatinine 1.22 Labs (9/19): LDL 108 Labs (10/19): hgb 14.5, K 4.3, creatinine 1.03 Labs 12/06/2017: K 4.7 Creatinine 1.7 Labs (3/20): K 4.8, creatinine 1.39, hgb 12.4 plts 226 Labs (11/20): K 4.2, creatinine 1.01, LDL 38 Labs (  12/20): K 3.5, creatinine 1.8 Labs (3/21): K 4.5, creatinine 1.5 Labs (2/22): K 4.2, creatinine 1.13 Labs (4/22): LDL 58 Labs (9/22): K 4.3, creatinine 1.27 Labs (12/22): K 4.4, creatinine 1.13 Labs (3/23): K 4.3, creatinine 1.02 Labs (5/23): K 4.3, creatinine 1.33   PMH: 1. Diabetic gastroparesis 2. Type II diabetes 3. HTN 4. Morbid obesity 5. CAD: s/p CABG in 12/11 after prior PCIs.  LIMA-LAD, SVG-D, seq SVG-OM1 and OM2, SVG-PDA. Adenosine Cardiolite (8/14) with EF 53% and a small reversible apical defect with a medium, partially reversible inferior defect.  LHC (9/14) with patent SVG-D, LIMA-LAD (50% distal LAD), and sequential SVG-OM branches; the SVG-RCA and the native RCA were occluded.  This was managed medically initially, but with ongoing exertional chest pain, it was decided to open CTO.  Patient had CTO opening with 4 overlapping Xience DES in the RCA in 5/15.   - LHC (4/17): SVG-D patent, LIMA-LAD patent, distal LAD with several 40-50%  stenoses, sequential SVG-OM1 and PLOM patent with 50% proximal stenosis (not flow limiting), SVG-RCA TO with patent RCA stents.  - LHC (6/18): 4/5 grafts patent (SVG-RCA TO, same as past), RCA stents patent => no change.  - LHC (9/19): Sequential SVG-PLOM/OM1 with 80% proximal stenosis, s/p DES.  - Cardiolite (1/21): EF 57%, fixed inferior defect (likely artifact), no ischemia.  - LHC (3/23): severe CAD with 90% in-stent restenosis of pRCA, s/p PCI to in-stent restenosis of pRCA 6. Atypical migraines 7. OHS/OSA: Intolerant of CPAP. Uses oxygen with exertion and at night.  8. GERD with hiatal hernia 9. OA 10. HFPEF: She has a Cardiomems device.  Echo (11/13) with EF 50-55%, grade II diastolic dysfunction, mild-moderate MR.  Echo (8/14) with EF 55-60%, grade II diastolic dysfunction, mildly increased aortic valve gradient (mean 12 mmHg) but valve opens well, mild MR and mild RV dilation.  Echo (9/15) with EF 60-65%, grade II diastolic dysfunction, mild aortic stenosis, mild mitral stenosis, mild to moderate mitral regurgitation, mildly dilated RV with normal systolic function, PA systolic pressure 46 mmHg.  - RHC (4/17): mean RA 9, PA 38/15 mean 27, mean PCWP 9, CI 2.5.  - Echo (5/17): EF 55-60%, mild LVH, mildly dilated RV with low normal systolic function, moderate TR, PASP 61 mmHg - Echo (1/19): EF 55-60%, moderate diastolic dysfunction, PASP 50 mmHg, mildly dilated RV, mild AS, mild-moderate MR. - RHC (9/19): mean RA 11, PA 34/12, mean PCWP 15, CI 2.85 - Echo (3/20): EF 25-30% with mid-apical severe hypokinesis.  - Echo (11/20): EF 45-50%, moderately dilated RV with moderately decreased systolic function, moderate-severe MR, moderate TR.  - TEE (12/20): EF 50-55%, septal-lateral dyssynchrony, moderate RV dilation/mildly decreased function, peak RV-RA gradient 51 mmHg, moderate MR.  - Echo (5/22): EF 60-65%, moderate LVH, normal RV, PASP 37 mmHg, 2.5 x 2.1 cm calcified mass posterior mitral annulus  with mild-moderate MR => ?severe MAC but more circumscribed and prominent than in the past, mild AS mean gradient 12 mmHg and AVA 1.83 cm^2.  - Echo (11/22): EF 55%, normal RV, mass posterior mitral annulus, moderate MR, mild AS, IVC normal. - Cardiac MRI in 1/23 showed LV EF 48%, mild LV dilation, normal RV with EF 53%, extensive MAC is likely the source of the posterior mitral annulus mass, basal-mid inferior >50% subendocardial LGE suggestive of prior inferior MI.  - RHC (3/23): normal PCWP (14) and RA pressure (2) , mild pulmonary hypertension, preserved CO/CI (6.51/3.28). 11. CKD 12. Chronic LBBB 13. Anxiety 14. Carotid stenosis: Followed by  [facility], >70% LICA stenosis 96/28.  She had left carotid stent in 2/15. Carotid dopplers 8/15 with no significant disease. Carotid dopplers (4/16): < 40% RICA stenosis.  - Carotid dopplers (9/22): 1-39% BICA stenosis.  15. Cerebrovascular disease: CVA 2/14 with right posterior cerebral artery territory ischemic infarction. Cerebral angiogram in 6/14 showed 70% right vertebral ostial stenosis, 36-62% LICA stenosis, > 94% proximal left posterior cerebral artery stenosis, posterior communicating artery aneurysm.  Patient has had episodes of transient expressive aphasia.  Carotid dopplers (76/54) showed >65% LICA stenosis. She had a left carotid stent 2/15.  Possible CVA in 7/15. -  MRI/MRA in May 2018 that showed moderately severe stenosis of the supraclinoid segment of the right internal carotid artery, progressed since the 2016 MR angiogram and severe basilar artery stenosis.  16. Positional vertigo (suspected) 17. Palpitations: Holter (6/15) with rare PVCs/PACs.  - Event monitor (2/18): NSR, occasional PVCs 18. Dyspnea with Brilinta. 19. Melanoma: On face, s/p excision.   20. Aortic stenosis: Mild on 11/22 echo.  21. Holter (8/16) with no significant arrhythmias.  22. Lower extremity arterial dopplers (9/16) were normal.  23. Sleep study (4/18): No  significant OSA.  24. Atrial fibrillation: Paroxysmal - Amiodarone stopped, ?lung toxicity.  25. H/o ITP in 3/20.  16. Partial seizures 17. Mitral regurgitation: Moderate on TEE in 12/20.  - Mild to moderate on 5/22 echo.  - Moderate on 11/22 echo 18. Posterior mitral annular mass: Cardiac MRI was done in 1/23 to investigate posterior mitral annulus mass; study showed LV EF 48%, mild LV dilation, normal RV with EF 53%, extensive MAC is likely the source of the posterior mitral annulus mass, basal-mid inferior >50% subendocardial LGE suggestive of prior inferior MI.   Current Outpatient Medications  Medication Sig Dispense Refill   acetaminophen (TYLENOL) 500 MG tablet Take 500 mg by mouth every 6 (six) hours as needed for mild pain, moderate pain, fever or headache.     albuterol (VENTOLIN HFA) 108 (90 Base) MCG/ACT inhaler Inhale 2 puffs into the lungs every 4 (four) hours as needed for wheezing or shortness of breath.      Alirocumab (PRALUENT) 150 MG/ML SOAJ Inject 150 mg/mL into the skin every 14 (fourteen) days.     ALPRAZolam (XANAX) 0.5 MG tablet Take 1-2 tablets (0.5-1 mg total) by mouth See admin instructions. Take one tablet (0.5 mg) by mouth every morning and two tablets (1 mg) at night, may also take 1 tablet (0.5 mg) midday as needed for anxiety     amLODipine (NORVASC) 10 MG tablet Take 10 mg by mouth daily.     apixaban (ELIQUIS) 5 MG TABS tablet Take 1 tablet (5 mg total) by mouth 2 (two) times daily. 180 tablet 1   B Complex-C (B-COMPLEX WITH VITAMIN C) tablet Take 1 tablet by mouth daily.     bisacodyl (DULCOLAX) 5 MG EC tablet Take 5 mg by mouth daily as needed for moderate constipation.     buPROPion (WELLBUTRIN XL) 150 MG 24 hr tablet Take 150 mg by mouth every morning.     cholecalciferol (VITAMIN D3) 25 MCG (1000 UNIT) tablet Take 1,000 Units by mouth daily.     clopidogrel (PLAVIX) 75 MG tablet Take 75 mg by mouth every morning.     denosumab (PROLIA) 60 MG/ML SOSY  injection Inject 60 mg into the skin every 6 (six) months.     DULoxetine (CYMBALTA) 30 MG capsule Take 1 capsule (30 mg total) by mouth daily. 30 capsule 0  ezetimibe (ZETIA) 10 MG tablet Take 1 tablet (10 mg total) by mouth at bedtime.     fluocinonide cream (LIDEX) 3.29 % Apply 1 application. topically 2 (two) times daily as needed (irritation).     Fluticasone-Salmeterol (ADVAIR) 250-50 MCG/DOSE AEPB Inhale 1 puff into the lungs daily.     gabapentin (NEURONTIN) 600 MG tablet Take 600-1,200 mg by mouth See admin instructions. 600 mg in the morning, 1200 mg at bedtime, and may take an additional dose, 600 mg, throughout the day if needed     Insulin Glargine (LANTUS SOLOSTAR) 100 UNIT/ML Solostar Pen Inject 28-44 Units into the skin See admin instructions. Per sliding scale     insulin lispro (HUMALOG) 100 UNIT/ML KwikPen Inject 14-20 Units into the skin See admin instructions. Inject 14 units subcutaneously prior to breakfast and supper; add 4 units for CBG >200     ipratropium-albuterol (DUONEB) 0.5-2.5 (3) MG/3ML SOLN Take 3 mLs by nebulization 2 (two) times daily as needed (asthma).     loperamide (IMODIUM A-D) 2 MG tablet Take 2 mg by mouth 4 (four) times daily as needed for diarrhea or loose stools.     methocarbamol (ROBAXIN) 500 MG tablet Take 500 mg by mouth 3 (three) times daily as needed for muscle spasms.     Multiple Vitamin (MULTIVITAMIN WITH MINERALS) TABS tablet Take 1 tablet by mouth every morning. Centrum - Women over 55     Multiple Vitamins-Minerals (OCUVITE EYE HEALTH FORMULA) CAPS Take 1 capsule by mouth every morning.     nitroGLYCERIN (NITROSTAT) 0.3 MG SL tablet Place 1 tablet (0.3 mg total) under the tongue every 5 (five) minutes x 3 doses as needed for chest pain. 25 tablet 1   ondansetron (ZOFRAN-ODT) 4 MG disintegrating tablet Take 4 mg by mouth 2 (two) times daily as needed for nausea or vomiting.     OXYGEN Inhale 2 L into the lungs at bedtime as needed (shortness of  breath).      pantoprazole (PROTONIX) 40 MG tablet Take 1 tablet (40 mg total) by mouth daily. 30 tablet 0   Polyvinyl Alcohol-Povidone (REFRESH OP) Place 1 drop into both eyes daily as needed (redness/ dry eyes).     potassium chloride SA (KLOR-CON) 20 MEQ tablet Take 1 tablet (20 mEq total) by mouth daily. 90 tablet 3   Pyridoxine HCl (VITAMIN B-6 PO) Take 1 tablet by mouth every morning.     ranolazine (RANEXA) 1000 MG SR tablet TAKE ONE TABLET BY MOUTH TWICE DAILY 180 tablet 3   rosuvastatin (CRESTOR) 40 MG tablet Take 1 tablet (40 mg total) by mouth at bedtime. 30 tablet 0   sacubitril-valsartan (ENTRESTO) 97-103 MG Take 1 tablet by mouth 2 (two) times daily. 180 tablet 3   torsemide (DEMADEX) 20 MG tablet Take 0.5 tablets (10 mg total) by mouth every other day. 15 tablet 2   traZODone (DESYREL) 50 MG tablet Take 1 tablet (50 mg total) by mouth at bedtime. 90 tablet 1   vitamin B-12 (CYANOCOBALAMIN) 1000 MCG tablet Take 1,000 mcg by mouth every morning.      zolpidem (AMBIEN) 10 MG tablet Take 10 mg by mouth at bedtime as needed for sleep.     No current facility-administered medications for this encounter.    Allergies:   Amoxicillin, Brilinta [ticagrelor], Erythromycin, Flagyl [metronidazole], Penicillins, Isosorbide mononitrate [isosorbide nitrate], Jardiance [empagliflozin], Metformin and related, and Tape   Social History:  The patient  reports that she has never smoked. She has never  used smokeless tobacco. She reports that she does not drink alcohol and does not use drugs.   Family History:  The patient's family history includes AAA (abdominal aortic aneurysm) in her father and mother; Cancer in her sister; Deep vein thrombosis in her father and mother; Dementia in her maternal grandmother and mother; Diabetes in her father, paternal aunt, paternal grandmother, paternal uncle, and paternal uncle; Heart attack in her father; Heart attack (age of onset: 38) in her paternal grandfather;  Heart disease in her father and paternal uncle; Hyperlipidemia in her father; Hypertension in her father, mother, and sister.   ROS:  Please see the history of present illness.   All other systems are personally reviewed and negative.   Recent Labs: 03/31/2021: B Natriuretic Peptide 339.3 06/02/2021: BUN 33; Creatinine, Ser 1.33; Hemoglobin 14.8; Platelets 173; Potassium 4.3; Sodium 140  Personally reviewed   Wt Readings from Last 3 Encounters:  07/07/21 101.9 kg  06/02/21 99.8 kg  05/05/21 100 kg    BP (!) 144/78   Pulse 60   Wt 101.9 kg (224 lb 9.6 oz)   SpO2 96%   BMI 41.08 kg/m   Physical Exam:   General:  NAD. No resp difficulty, walked into clinic with rolling walker. HEENT: Normal Neck: Supple. No JVD. Carotids 2+ bilat; no bruits. No lymphadenopathy or thryomegaly appreciated. Cor: PMI nondisplaced. Regular rate & rhythm. No rubs, gallops, 2/6 SEM RUSB Lungs: Clear Abdomen: Obese, soft, nontender, nondistended. No hepatosplenomegaly. No bruits or masses. Good bowel sounds. Extremities: No cyanosis, clubbing, rash, edema Neuro: Alert & oriented x 3, cranial nerves grossly intact. Moves all 4 extremities w/o difficulty. Affect pleasant.  Assessment & Plan: 1. CAD: Occluded native RCA and SVG-RCA.  Status post opening of CTO RCA with 4 overlapping Xience DES in 5/15.  DES to sequential SVG-OM1/PLOM in 9/19.  She has been on Plavix and Eliquis.  No ischemia on 1/21 Cardiolite.  For the last month, patient has been having typical angina after walking about 100 feet that resolves with rest, no CP at rest. LHC done yesterday showed severe native coronary disease with 90% in-stent restenosis in the proximal RCA (patient has 2 layers of stent at this site). This is felt to be culprit for her recent symptoms. The sequential SVG-OM1/PLOM is patent, SVG-D is patent, and LIMA-LAD is patent. She underwent PTCA of pRCA.  - Plan DAPT w/ ASA + Plavix (has been on chronic Plavix, neuro has wanted  her to stay on this for cerebrovascular disease).   - Continue Eliquis. No bleeding issues. - Off Imdur with headache. - Off Toprol XL with bradycardia.   - Continue ranolazine 1000 mg bid and amlodipine 10 mg daily for angina.  - Ar[facility] for [facility] at [facility]. 2. Chronic systolic => diastolic CHF: Echo in 6/64 with EF 55-60%, moderate diastolic dysfunction. Cardiomems placed 01/16/18.  Echo in 3/20 in setting of severe PNA with intubation showed EF 25-30%, mid-apical LV severe hypokinesis.  Possible stress (Takotsubo-type) cardiomyopathy related to severe medical illness versus progression of CAD.  She has baseline chronic LBBB which could play a role in cardiomyopathy as well (LBBB cardiomyopathy).  Repeat echo in 11/20 with EF up to 45-50%, moderately dysfunctional RV.  TEE in 12/20 with EF 50-55% with septal-lateral dyssynchrony and mildly dysfunctional RV.  Most recent echo in 11/22 with EF 55% with normal RV.  cMRI in 1/23 with EF 48% and evidence for prior inferior MI.  NYHA class III symptoms, worse recently but suspect  likely due to high grade RCA disease as found on cath, now s/p RCA PCI. RHC showed normal PCWP and RA pressure. She does not appear volume overloaded on exam, but ReDs borderline at 34%. - Increase torsemide to 10 mg daily. BMET/BNP today, repeat BMET in 2 weeks. I asked her to call Cardiomems customer support to see if they can help troubleshoot her connectivity issues with her pillow. We were able to optimize her fluid levels well when she was sending regular readings. - Continue spironolactone 25 mg daily.  - Continue Entresto 97/103 mg bid.  - Cannot tolerate SGLT2i. 3. Hyperlipidemia: She remains on Zetia, Crestor and Praluent. Despite reported full compliance, recent lipid panel 2/23 showed elevated LDL at 102 mg/dL (goal < 70). She has been referred back to [facility]  4. Hypertension: Elevated today, but generally well controlled at home. No medication changes today. 5.  Cerebrovascular disease: She has history of CVA and had left carotid stent in 2/15. Followed by [facility].  Most recent MRA head showed severe stenosis of supraclinoid RICA and severe basilar artery stenosis.  - Neurology would like her to continue Plavix for this despite concomitant apixaban use.  - ? If CVA and/or ICA stenosis contributing to her chronic dizziness. She has follow up with Dr. [name] soon. 6. OHS/OSA: Cannot tolerate CPAP.  Uses oxygen at night.  7. Atrial fibrillation: Paroxysmal, no recent palpitations. 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 She is regular on exam today. - Continue apixaban 5 mg bid.  8. Obesity: Weight has trended down over the last year. Body mass index is 41.08 kg/m. 9. Mitral regurgitation: Moderate on 11/22 echo.  10. Bradycardia: Now off Toprol XL.   11. Aortic stenosis: Mild on 11/22 echo.  12. Mitral annular mass: This is a well-circumscribed calcified posterior mitral annulus mass noted on 5/22 and 11/22 echoes.   - Based on 1/23 cMRI, likely exuberant MAC. 13. Frequent falls: Unclear if these are related to her dizziness or generalized weakness. We discussed fall precautions. She wants to go ahead with cardiac rehab in effort to preserve mobility.   Follow up in 2 months with Dr. [name], as scheduled.  Signed, Rafael Bihari, FNP  07/07/2021  [facility] [place] [address] [facility] [place] [address] [phone] (office) [phone] (fax)

## 2021-07-07 ENCOUNTER — Ambulatory Visit (HOSPITAL_COMMUNITY)
Admission: RE | Admit: 2021-07-07 | Discharge: 2021-07-07 | Disposition: A | Discharge status: Home / Self Care | Payer: Medicare Other | Source: Ambulatory Visit | Attending: Family Medicine | Admitting: Family Medicine

## 2021-07-07 ENCOUNTER — Encounter (HOSPITAL_COMMUNITY): Payer: Self-pay

## 2021-07-07 VITALS — BP 144/78 | HR 60 | Wt 224.6 lb

## 2021-07-07 DIAGNOSIS — Z6841 Body Mass Index (BMI) 40.0 and over, adult: Secondary | ICD-10-CM | POA: Insufficient documentation

## 2021-07-07 DIAGNOSIS — I25119 Atherosclerotic heart disease of native coronary artery with unspecified angina pectoris: Secondary | ICD-10-CM

## 2021-07-07 DIAGNOSIS — I5042 Chronic combined systolic (congestive) and diastolic (congestive) heart failure: Secondary | ICD-10-CM | POA: Diagnosis not present

## 2021-07-07 DIAGNOSIS — Z833 Family history of diabetes mellitus: Secondary | ICD-10-CM | POA: Insufficient documentation

## 2021-07-07 DIAGNOSIS — I5022 Chronic systolic (congestive) heart failure: Secondary | ICD-10-CM | POA: Diagnosis not present

## 2021-07-07 DIAGNOSIS — E785 Hyperlipidemia, unspecified: Secondary | ICD-10-CM | POA: Diagnosis not present

## 2021-07-07 DIAGNOSIS — F419 Anxiety disorder, unspecified: Secondary | ICD-10-CM | POA: Insufficient documentation

## 2021-07-07 DIAGNOSIS — R001 Bradycardia, unspecified: Secondary | ICD-10-CM

## 2021-07-07 DIAGNOSIS — I080 Rheumatic disorders of both mitral and aortic valves: Secondary | ICD-10-CM | POA: Insufficient documentation

## 2021-07-07 DIAGNOSIS — Z8249 Family history of ischemic heart disease and other diseases of the circulatory system: Secondary | ICD-10-CM | POA: Insufficient documentation

## 2021-07-07 DIAGNOSIS — G4733 Obstructive sleep apnea (adult) (pediatric): Secondary | ICD-10-CM | POA: Diagnosis not present

## 2021-07-07 DIAGNOSIS — Z794 Long term (current) use of insulin: Secondary | ICD-10-CM | POA: Diagnosis not present

## 2021-07-07 DIAGNOSIS — E1143 Type 2 diabetes mellitus with diabetic autonomic (poly)neuropathy: Secondary | ICD-10-CM | POA: Insufficient documentation

## 2021-07-07 DIAGNOSIS — I272 Pulmonary hypertension, unspecified: Secondary | ICD-10-CM | POA: Insufficient documentation

## 2021-07-07 DIAGNOSIS — I130 Hypertensive heart and chronic kidney disease with heart failure and stage 1 through stage 4 chronic kidney disease, or unspecified chronic kidney disease: Secondary | ICD-10-CM | POA: Diagnosis not present

## 2021-07-07 DIAGNOSIS — Z955 Presence of coronary angioplasty implant and graft: Secondary | ICD-10-CM | POA: Diagnosis not present

## 2021-07-07 DIAGNOSIS — R296 Repeated falls: Secondary | ICD-10-CM

## 2021-07-07 DIAGNOSIS — Z7902 Long term (current) use of antithrombotics/antiplatelets: Secondary | ICD-10-CM | POA: Insufficient documentation

## 2021-07-07 DIAGNOSIS — Z880 Allergy status to penicillin: Secondary | ICD-10-CM | POA: Diagnosis not present

## 2021-07-07 DIAGNOSIS — Z79899 Other long term (current) drug therapy: Secondary | ICD-10-CM | POA: Insufficient documentation

## 2021-07-07 DIAGNOSIS — Z9981 Dependence on supplemental oxygen: Secondary | ICD-10-CM | POA: Insufficient documentation

## 2021-07-07 DIAGNOSIS — I2510 Atherosclerotic heart disease of native coronary artery without angina pectoris: Secondary | ICD-10-CM | POA: Insufficient documentation

## 2021-07-07 DIAGNOSIS — Z951 Presence of aortocoronary bypass graft: Secondary | ICD-10-CM | POA: Diagnosis not present

## 2021-07-07 DIAGNOSIS — I340 Nonrheumatic mitral (valve) insufficiency: Secondary | ICD-10-CM

## 2021-07-07 DIAGNOSIS — I447 Left bundle-branch block, unspecified: Secondary | ICD-10-CM | POA: Insufficient documentation

## 2021-07-07 DIAGNOSIS — Z7901 Long term (current) use of anticoagulants: Secondary | ICD-10-CM | POA: Diagnosis not present

## 2021-07-07 DIAGNOSIS — I2582 Chronic total occlusion of coronary artery: Secondary | ICD-10-CM | POA: Insufficient documentation

## 2021-07-07 DIAGNOSIS — I480 Paroxysmal atrial fibrillation: Secondary | ICD-10-CM

## 2021-07-07 DIAGNOSIS — Z881 Allergy status to other antibiotic agents status: Secondary | ICD-10-CM | POA: Diagnosis not present

## 2021-07-07 DIAGNOSIS — Z7951 Long term (current) use of inhaled steroids: Secondary | ICD-10-CM | POA: Diagnosis not present

## 2021-07-07 DIAGNOSIS — E1122 Type 2 diabetes mellitus with diabetic chronic kidney disease: Secondary | ICD-10-CM | POA: Diagnosis not present

## 2021-07-07 DIAGNOSIS — Z888 Allergy status to other drugs, medicaments and biological substances status: Secondary | ICD-10-CM | POA: Diagnosis not present

## 2021-07-07 DIAGNOSIS — K449 Diaphragmatic hernia without obstruction or gangrene: Secondary | ICD-10-CM | POA: Insufficient documentation

## 2021-07-07 DIAGNOSIS — E1129 Type 2 diabetes mellitus with other diabetic kidney complication: Secondary | ICD-10-CM

## 2021-07-07 DIAGNOSIS — I10 Essential (primary) hypertension: Secondary | ICD-10-CM

## 2021-07-07 DIAGNOSIS — N189 Chronic kidney disease, unspecified: Secondary | ICD-10-CM | POA: Diagnosis not present

## 2021-07-07 DIAGNOSIS — D693 Immune thrombocytopenic purpura: Secondary | ICD-10-CM | POA: Insufficient documentation

## 2021-07-07 DIAGNOSIS — K219 Gastro-esophageal reflux disease without esophagitis: Secondary | ICD-10-CM | POA: Insufficient documentation

## 2021-07-07 DIAGNOSIS — I350 Nonrheumatic aortic (valve) stenosis: Secondary | ICD-10-CM

## 2021-07-07 DIAGNOSIS — Z8673 Personal history of transient ischemic attack (TIA), and cerebral infarction without residual deficits: Secondary | ICD-10-CM

## 2021-07-07 LAB — BASIC METABOLIC PANEL
Anion gap: 7 (ref 5–15)
BUN: 23 mg/dL (ref 8–23)
CO2: 24 mmol/L (ref 22–32)
Calcium: 9 mg/dL (ref 8.9–10.3)
Chloride: 110 mmol/L (ref 98–111)
Creatinine, Ser: 0.98 mg/dL (ref 0.44–1.00)
GFR, Estimated: >60 mL/min (ref >60)
Glucose, Bld: 136 mg/dL — ABNORMAL HIGH (ref 70–99)
Potassium: 4.5 mmol/L (ref 3.5–5.1)
Sodium: 141 mmol/L (ref 135–145)

## 2021-07-07 LAB — BRAIN NATRIURETIC PEPTIDE: B Natriuretic Peptide: 140.4 pg/mL — ABNORMAL HIGH (ref 0.0–100.0)

## 2021-07-07 MED ORDER — TORSEMIDE 20 MG PO TABS: 10.0000 mg | ORAL_TABLET | ORAL | 2 refills | Status: DC

## 2021-07-07 NOTE — Progress Notes (Signed)
ReDS Vest / Clip - 07/07/21 1100       ReDS Vest / Clip   Station Marker A    Ruler Value 27    ReDS Value Range Low volume    ReDS Actual Value 34

## 2021-07-07 NOTE — Patient Instructions (Addendum)
Thank you for coming in today  Labs were done today, if any labs are abnormal the clinic will call you No news is good news  PLEASE USE CARDIOMEMS   CHANGE Torsemide 10 mg daily 1/2 tablet   Your physician recommends that you schedule a follow-up appointment in:  2-3 months with Dr. [name]  At the [facility], you and your health needs are our priority. As part of our continuing mission to provide you with exceptional heart care, we have created designated Provider Care Teams. These Care Teams include your primary Cardiologist (physician) and Advanced Practice Providers (APPs- Physician Assistants and Nurse Practitioners) who all work together to provide you with the care you need, when you need it.   You may see any of the following providers on your designated Care Team at your next follow up: Dr Glori Bickers Dr Haynes Kerns, NP Lyda Jester, [place] [facility] [place], [place] Audry Riles, PharmD   Please be sure to bring in all your medications bottles to every appointment.   If you have any questions or concerns before your next appointment please send [place] a message through [place] or call our office at [phone].    TO LEAVE A MESSAGE FOR THE NURSE SELECT OPTION 2, PLEASE LEAVE A MESSAGE INCLUDING: YOUR NAME DATE OF BIRTH CALL BACK NUMBER REASON FOR CALL**this is important as we prioritize the call backs  YOU WILL RECEIVE A CALL BACK THE SAME DAY AS LONG AS YOU CALL BEFORE 4:00 PM

## 2021-08-24 ENCOUNTER — Encounter: Payer: Self-pay | Admitting: Neurology

## 2021-08-24 ENCOUNTER — Ambulatory Visit (INDEPENDENT_AMBULATORY_CARE_PROVIDER_SITE_OTHER): Payer: Medicare Other | Admitting: Neurology

## 2021-08-24 VITALS — BP 168/78 | HR 53 | Ht 62.6 in | Wt 228.4 lb

## 2021-08-24 DIAGNOSIS — L814 Other melanin hyperpigmentation: Secondary | ICD-10-CM | POA: Insufficient documentation

## 2021-08-24 DIAGNOSIS — Z794 Long term (current) use of insulin: Secondary | ICD-10-CM | POA: Insufficient documentation

## 2021-08-24 DIAGNOSIS — I208 Other forms of angina pectoris: Secondary | ICD-10-CM | POA: Diagnosis not present

## 2021-08-24 DIAGNOSIS — E1165 Type 2 diabetes mellitus with hyperglycemia: Secondary | ICD-10-CM | POA: Insufficient documentation

## 2021-08-24 DIAGNOSIS — R260 Ataxic gait: Secondary | ICD-10-CM

## 2021-08-24 DIAGNOSIS — L821 Other seborrheic keratosis: Secondary | ICD-10-CM | POA: Insufficient documentation

## 2021-08-24 DIAGNOSIS — Z8673 Personal history of transient ischemic attack (TIA), and cerebral infarction without residual deficits: Secondary | ICD-10-CM

## 2021-08-24 DIAGNOSIS — K3184 Gastroparesis: Secondary | ICD-10-CM | POA: Insufficient documentation

## 2021-08-24 DIAGNOSIS — Z86018 Personal history of other benign neoplasm: Secondary | ICD-10-CM | POA: Insufficient documentation

## 2021-08-24 DIAGNOSIS — D1800 Hemangioma unspecified site: Secondary | ICD-10-CM | POA: Insufficient documentation

## 2021-08-24 DIAGNOSIS — D0362 Melanoma in situ of left upper limb, including shoulder: Secondary | ICD-10-CM | POA: Insufficient documentation

## 2021-08-24 NOTE — Progress Notes (Signed)
PATIENT: [name] [name] DOB: Aug 06, 1945  REASON FOR VISIT: follow up HISTORY FROM: patient PRIMARY NEUROLOGIST: Dr. Jannifer Franklin  HISTORY OF PRESENT ILLNESS: Update 08/24/21: She returns for follow-up after last visit 6 months ago.  States she is doing better.  She still has some intermittent stuttering but she has learned to concentrate and overcome it.  She is still has some headaches mostly on the left side as well as some vision difficulties and dizziness but these seem to be improving as well.  She was seen in the ED on 06/02/2021 with an episode of hypoglycemia which was due to accidental increased dose of insulin that she had taken.  She states she mixed up her short acting and long-acting insulin and use around the amount by mistake.  She has done well since then and has had no further mixup or hypoglycemic episodes.  She was also admitted in March 2023 with unstable angina.  She had a cardiac cath in April and switch to Eliquis and Plavix which is tolerating well without major bleeding but does bruise easily.  Blood pressure she states is usually well controlled but today it is elevated in office at 168/78.  She is tolerating Praluent injections well and last lipid profile checked by primary care physician and March shows satisfactory.  She uses a rollator to walk and does well usually though she has had a few occasional falls when she is bending down.  She states her balance is poor.  She has no new complaints.  She had a EEG done on 03/26/2021 which was normal.  MRI scan of the brain on 03/12/2021 showed no acute abnormality.  Old right PCA infarct and bilateral subcortical lacunar infarcts of remote age.  Prior visit 02/26/2021  [name], NP): [name] [name] is a 76 year old female with a history of diabetes associated with diabetic peripheral neuropathy, history of cerebrovascular disease with right posterior cerebral artery distribution stroke in the past.  She returns today for follow-up.  She states  that she feels that she has had TIA events since the last visit.  She states that she feels as if she blacks out and does not remember what just happened.  She states that in the last 4 weeks she has noticed increased numbness and tingling on the left side of the body.  Reports that she had a fall Saturday bruising her left arm.  Did not go to the PCP or ED.  She is participating in physical therapy for balance and gait.  Continues to have stuttering speech off and on.  Remains on Eliquis.  Follows with cardiology and her PCP regularly.    HISTORY [name] [name] is a 76 year old right-handed white female with a history of diabetes associated with diabetic peripheral neuropathy.  The patient has history of cerebrovascular disease, she sustained a right posterior cerebral artery distribution stroke in the past.  She does not operate a motor vehicle at this time.  She reports some ongoing decline in memory with short-term memory issues.  She has some dizziness oftentimes with standing.  She uses a walker for ambulation, she has not had any recent falls.  She continues to have some stuttering speech off and on.  She returns for further evaluation.  She did have a carotid Doppler study in August 2021 that was unremarkable  REVIEW OF SYSTEMS: Out of a complete 14 system review of symptoms, the patient complains only of the following symptoms, and all other reviewed systems are negative.  ALLERGIES:  Allergies  Allergen Reactions   Amoxicillin Shortness Of Breath and Rash   Brilinta [Ticagrelor] Shortness Of Breath   Erythromycin Shortness Of Breath, Other (See Comments) and Hives    Trouble swallowing   Flagyl [Metronidazole] Shortness Of Breath and Palpitations   Penicillins Hives, Shortness Of Breath, Rash and Other (See Comments)    Has patient had a PCN reaction causing immediate rash, facial/tongue/throat swelling, SOB or lightheadedness with hypotension: Yes Has patient had a PCN reaction causing severe  rash involving mucus membranes or skin necrosis: No Has patient had a PCN reaction that required hospitalization: Yes Has patient had a PCN reaction occurring within the last 10 years: No If all of the above answers are "NO", then may proceed with Cephalosporin use.    Isosorbide Mononitrate [Isosorbide Nitrate] Other (See Comments)    Joint aches, muscles hurt, difficult to walk    Jardiance [Empagliflozin] Other (See Comments)    Nausea, joint aches, muscles aches   Metformin And Related Other (See Comments)    Stomach pain, cold sweats, joint pain, burred vision, dizziness   Tape Other (See Comments)    Skin pulls off with certain types Plastic tape causes skin to rip if left on for long periods of time    HOME MEDICATIONS: Outpatient Medications Prior to Visit  Medication Sig Dispense Refill   acetaminophen (TYLENOL) 500 MG tablet Take 500 mg by mouth every 6 (six) hours as needed for mild pain, moderate pain, fever or headache.     albuterol (VENTOLIN HFA) 108 (90 Base) MCG/ACT inhaler Inhale 2 puffs into the lungs every 4 (four) hours as needed for wheezing or shortness of breath.      Alirocumab (PRALUENT) 150 MG/ML SOAJ Inject 150 mg/mL into the skin every 14 (fourteen) days.     ALPRAZolam (XANAX) 0.5 MG tablet Take 1-2 tablets (0.5-1 mg total) by mouth See admin instructions. Take one tablet (0.5 mg) by mouth every morning and two tablets (1 mg) at night, may also take 1 tablet (0.5 mg) midday as needed for anxiety     amLODipine (NORVASC) 10 MG tablet Take 10 mg by mouth daily.     apixaban (ELIQUIS) 5 MG TABS tablet Take 1 tablet (5 mg total) by mouth 2 (two) times daily. 180 tablet 1   B Complex-C (B-COMPLEX WITH VITAMIN C) tablet Take 1 tablet by mouth daily.     bisacodyl (DULCOLAX) 5 MG EC tablet Take 5 mg by mouth daily as needed for moderate constipation.     buPROPion (WELLBUTRIN XL) 150 MG 24 hr tablet Take 150 mg by mouth every morning.     cholecalciferol (VITAMIN  D3) 25 MCG (1000 UNIT) tablet Take 1,000 Units by mouth daily.     clopidogrel (PLAVIX) 75 MG tablet Take 75 mg by mouth every morning.     denosumab (PROLIA) 60 MG/ML SOSY injection Inject 60 mg into the skin every 6 (six) months.     DULoxetine (CYMBALTA) 30 MG capsule Take 1 capsule (30 mg total) by mouth daily. 30 capsule 0   ezetimibe (ZETIA) 10 MG tablet Take 1 tablet (10 mg total) by mouth at bedtime.     fluocinonide cream (LIDEX) 5.42 % Apply 1 application. topically 2 (two) times daily as needed (irritation).     Fluticasone-Salmeterol (ADVAIR) 250-50 MCG/DOSE AEPB Inhale 1 puff into the lungs daily.     gabapentin (NEURONTIN) 600 MG tablet Take 600-1,200 mg by mouth See admin instructions. 600 mg in the morning,  1200 mg at bedtime, and may take an additional dose, 600 mg, throughout the day if needed     Insulin Glargine (LANTUS SOLOSTAR) 100 UNIT/ML Solostar Pen Inject 28-44 Units into the skin See admin instructions. Per sliding scale     insulin lispro (HUMALOG) 100 UNIT/ML KwikPen Inject 14-20 Units into the skin See admin instructions. Inject 14 units subcutaneously prior to breakfast and supper; add 4 units for CBG >200     ipratropium-albuterol (DUONEB) 0.5-2.5 (3) MG/3ML SOLN Take 3 mLs by nebulization 2 (two) times daily as needed (asthma).     loperamide (IMODIUM A-D) 2 MG tablet Take 2 mg by mouth 4 (four) times daily as needed for diarrhea or loose stools.     methocarbamol (ROBAXIN) 500 MG tablet Take 500 mg by mouth 3 (three) times daily as needed for muscle spasms.     Multiple Vitamin (MULTIVITAMIN WITH MINERALS) TABS tablet Take 1 tablet by mouth every morning. Centrum - Women over 55     Multiple Vitamins-Minerals (OCUVITE EYE HEALTH FORMULA) CAPS Take 1 capsule by mouth every morning.     nitroGLYCERIN (NITROSTAT) 0.3 MG SL tablet Place 1 tablet (0.3 mg total) under the tongue every 5 (five) minutes x 3 doses as needed for chest pain. 25 tablet 1   ondansetron  (ZOFRAN-ODT) 4 MG disintegrating tablet Take 4 mg by mouth 2 (two) times daily as needed for nausea or vomiting.     OXYGEN Inhale 2 L into the lungs at bedtime as needed (shortness of breath).      pantoprazole (PROTONIX) 40 MG tablet Take 1 tablet (40 mg total) by mouth daily. 30 tablet 0   Polyvinyl Alcohol-Povidone (REFRESH OP) Place 1 drop into both eyes daily as needed (redness/ dry eyes).     potassium chloride SA (KLOR-CON) 20 MEQ tablet Take 1 tablet (20 mEq total) by mouth daily. 90 tablet 3   Pyridoxine HCl (VITAMIN B-6 PO) Take 1 tablet by mouth every morning.     ranolazine (RANEXA) 1000 MG SR tablet TAKE ONE TABLET BY MOUTH TWICE DAILY 180 tablet 3   rosuvastatin (CRESTOR) 40 MG tablet Take 1 tablet (40 mg total) by mouth at bedtime. 30 tablet 0   sacubitril-valsartan (ENTRESTO) 97-103 MG Take 1 tablet by mouth 2 (two) times daily. 180 tablet 3   torsemide (DEMADEX) 20 MG tablet Take 0.5 tablets (10 mg total) by mouth every other day. 15 tablet 2   traZODone (DESYREL) 50 MG tablet Take 1 tablet (50 mg total) by mouth at bedtime. 90 tablet 1   vitamin B-12 (CYANOCOBALAMIN) 1000 MCG tablet Take 1,000 mcg by mouth every morning.      zolpidem (AMBIEN) 10 MG tablet Take 10 mg by mouth at bedtime as needed for sleep.     No facility-administered medications prior to visit.    PAST MEDICAL HISTORY: Past Medical History:  Diagnosis Date   Anemia    hx   Anxiety    Asthma    Basal cell carcinoma 05/2014   "left shoulder"   Bundle branch block, left    chronic/notes 07/18/2013   CHF (congestive heart failure) (HCC)    Chronic insomnia 05/06/2015   Chronic kidney disease    frequency, sees dr Jamal Maes every 4 to 6 months (01/16/2018)   Chronic lower back pain    Claustrophobia    Common migraine 05/14/2014   Coronary artery disease    MI in 2001, 2002, 2006, 2011, 2014   Depression  Diabetic peripheral neuropathy ([place]) 01/12/2019   GERD (gastroesophageal reflux disease)     H/O hiatal hernia    Headache    "at least 2/month" (01/16/2018)   Heart murmur    Hyperlipidemia    Hypertension    Memory change 05/14/2014   Migraine    "5-6/year"  (01/16/2018)   Obesity 01-2010   Obstructive sleep apnea    "can't wear machine; I have claustrophobia" (01/16/2018), states she had a 2nd sleep study and does not have sleep apnea, her O2 decreases and now is on 2 L of O2 at night.   On home oxygen therapy    "2L at night and prn during daytime" (01/16/2018)   Osteoarthritis    "knees and hands" (01/16/2018)   Peripheral vascular disease ([place])    ? numbness, tingling arms and legs   PONV (postoperative nausea and vomiting)    Stroke ([place])    2014, 2015, 2016   Type II diabetes mellitus ([place])    Ventral hernia    hx of    PAST SURGICAL HISTORY: Past Surgical History:  Procedure Laterality Date   ANKLE FRACTURE SURGERY Left 1970's   APPENDECTOMY  1970's   w/hysterectomy   BASAL CELL CARCINOMA EXCISION Left 05/2014   "shoulder" (01/16/2018)   CARDIAC CATHETERIZATION  10/10/2012   Dr [name].   CARDIAC CATHETERIZATION N/A 05/29/2015   Procedure: Right/Left Heart Cath and Coronary/Graft Angiography;  Surgeon: Larey Dresser, MD;  Location: [facility];  Service: Cardiovascular;  Laterality: N/A;   CAROTID ENDARTERECTOMY Left 03/2013   CAROTID STENT INSERTION Left 03/20/2013   Procedure: CAROTID STENT INSERTION;  Surgeon: [name], MD;  Location: [facility] C[facility] LAB;  Service: Cardiovascular;  Laterality: Left;  internal carotid   CEREBRAL ANGIOGRAM N/A 04/05/2011   Procedure: CEREBRAL ANGIOGRAM;  Surgeon: Angelia Mould, MD;  Location: [facility] CATH LAB;  Service: Cardiovascular;  Laterality: N/A;   CHOLECYSTECTOMY OPEN  2004   CORONARY ANGIOPLASTY WITH STENT PLACEMENT  01,02,05,06,07,08,11; 04/24/2013   "I've probably got ~ 10 stents by now" (04/24/2013)   CORONARY ANGIOPLASTY WITH STENT PLACEMENT  06/13/2013   "got 4 stents today" (06/13/2013)   CORONARY  ARTERY BYPASS GRAFT  1220/11   "CABG X5"   CORONARY BALLOON ANGIOPLASTY N/A 04/20/2021   Procedure: CORONARY BALLOON ANGIOPLASTY;  Surgeon: Lorretta Harp, MD;  Location: [place] [facility];  Service: Cardiovascular;  Laterality: N/A;   CORONARY STENT INTERVENTION N/A 10/20/2017   Procedure: CORONARY STENT INTERVENTION;  Surgeon: Troy Sine, MD;  Location: [facility] [facility];  Service: Cardiovascular;  Laterality: N/A;   CORONARY STENT INTERVENTION N/A 04/20/2021   Procedure: CORONARY STENT INTERVENTION;  Surgeon: Lorretta Harp, MD;  Location: [place] [facility];  Service: Cardiovascular;  Laterality: N/A;   ESOPHAGOGASTRODUODENOSCOPY  08/03/2011   Procedure: ESOPHAGOGASTRODUODENOSCOPY (EGD);  Surgeon: Shann Medal, MD;  Location: [facility];  Service: General;  Laterality: N/A;   ESOPHAGOGASTRODUODENOSCOPY (EGD) WITH PROPOFOL N/A 03/11/2014   Procedure: ESOPHAGOGASTRODUODENOSCOPY (EGD) WITH PROPOFOL;  Surgeon: Lafayette Dragon, MD;  Location: [facility];  Service: Endoscopy;  Laterality: N/A;   FRACTURE SURGERY     gall stone removal  05/2003   GASTRIC RESTRICTION SURGERY  1984   "stapeling"   HERNIA REPAIR  2004   "in my stomach; had OR on it twice", wire mesh on 1 hernia   LEFT HEART CATH AND CORS/GRAFTS ANGIOGRAPHY N/A 07/09/2016   Procedure: LEFT HEART CATH AND CORS/GRAFTS ANGIOGRAPHY;  Surgeon: [name],  Elby Showers, MD;  Location: [place] [facility];  Service: Cardiovascular;  Laterality: N/A;   ORIF ANKLE FRACTURE Left 05/16/2018   ORIF ANKLE FRACTURE Left 05/16/2018   Procedure: OPEN REDUCTION INTERNAL FIXATION (ORIF) Left ankle with possible syndesmosis fixation;  Surgeon: Nicholes Stairs, MD;  Location: [place];  Service: Orthopedics;  Laterality: Left;  159mn   OVARY SURGERY  1970's   "tumor removed"   PERCUTANEOUS CORONARY STENT INTERVENTION (PCI-S) N/A 06/13/2013   Procedure: PERCUTANEOUS CORONARY STENT INTERVENTION (PCI-S);  Surgeon: JJettie Booze MD;  [facility];  Service: Cardiovascular;  Laterality: N/A;   PERCUTANEOUS STENT INTERVENTION N/A 04/24/2013   Procedure: PERCUTANEOUS STENT INTERVENTION;  Surgeon: JJettie Booze MD;  [facility];  Service: Cardiovascular;  Laterality: N/A;   RIGHT/LEFT HEART CATH AND CORONARY/GRAFT ANGIOGRAPHY N/A 10/20/2017   Procedure: RIGHT/LEFT HEART CATH AND CORONARY/GRAFT ANGIOGRAPHY;  Surgeon: MLarey Dresser MD;  [facility];  Service: Cardiovascular;  Laterality: N/A;   RIGHT/LEFT HEART CATH AND CORONARY/GRAFT ANGIOGRAPHY N/A 04/20/2021   Procedure: RIGHT/LEFT HEART CATH AND CORONARY/GRAFT ANGIOGRAPHY;  Surgeon: BLorretta Harp MD;  [facility];  Service: Cardiovascular;  Laterality: N/A;   ROOT CANAL  10/2000   TEE WITHOUT CARDIOVERSION N/A 01/18/2019   Procedure: TRANSESOPHAGEAL ECHOCARDIOGRAM (TEE);  Surgeon: MLarey Dresser MD;  [facility];  Service: Cardiovascular;  Laterality: N/A;   TIBIA FRACTURE SURGERY Right 1970's   rods and pins   TOOTH EXTRACTION     "1 on the upper; wisdom tooth on the lower" (01/16/2018)   TOTAL ABDOMINAL HYSTERECTOMY  1970's   w/ appendectomy    FAMILY HISTORY: Family History  Problem Relation Age of Onset   Hypertension Mother    Deep vein thrombosis Mother    AAA (abdominal aortic aneurysm) Mother    Dementia Mother    Heart disease Father        Heart Disease before age 74  Diabetes Father    Hyperlipidemia Father    Hypertension Father    Heart attack Father    Deep vein thrombosis Father    AAA (abdominal aortic aneurysm) Father    Cancer Sister        Ovarian   Hypertension Sister    Diabetes Paternal Aunt    Heart disease Paternal Uncle    Diabetes Paternal Uncle    Diabetes Paternal Uncle    Dementia Maternal Grandmother    Diabetes Paternal Grandmother    Heart attack Paternal Grandfather 583      died of MI at 552  Colon cancer Neg Hx     SOCIAL HISTORY: Social History    Socioeconomic History   Marital status: Married    Spouse name: DShanon Brow   Number of children: 1   Years of education: 12+   Highest education level: Not on file  Occupational History   Occupation: retired  Tobacco Use   Smoking status: Never   Smokeless tobacco: Never  Vaping Use   Vaping Use: Never used  Substance and Sexual Activity   Alcohol use: Never   Drug use: Never   Sexual activity: Not Currently    Birth control/protection: Surgical    Comment: hysterectomy  Other Topics Concern   Not on file  Social History Narrative   Patient lives at home with husband DShanon Brow    Patient is right handed.   Patient drinks caffeine occasionally.   Social Determinants  of Health   Financial Resource Strain: Not on file  Food Insecurity: Not on file  Transportation Needs: Not on file  Physical Activity: Not on file  Stress: Not on file  Social Connections: Not on file  Intimate Partner Violence: Not on file      PHYSICAL EXAM Pleasant elderly Caucasian lady not in distress. . Afebrile. Head is nontraumatic. Neck is supple without bruit.    Cardiac exam no murmur or gallop. Lungs are clear to auscultation. Distal pulses are well felt.  Multiple bruises on the skins of both forearms Vitals:   08/24/21 1253  BP: (!) 168/78  Pulse: (!) 53  Weight: 228 lb 6.4 oz (103.6 kg)  Height: 5' 2.6" (1.59 m)   Body mass index is 40.98 kg/m.     02/26/2021   11:07 AM  MMSE - Mini Mental State Exam  Orientation to time 3  Orientation to Place 4  Registration 3  Attention/ Calculation 2  Recall 3  Language- name 2 objects 2  Language- repeat 1  Language- follow 3 step command 3  Language- read & follow direction 1  Write a sentence 1  Copy design 0  Total score 23     Generalized: Well developed, in no acute distress   Neurological examination  Mentation: Alert oriented to time, place, history taking. Follows all commands speech and language fluent Cranial nerve II-XII:  Pupils were equal round reactive to light. Extraocular movements were full, visual field were full on confrontational test. Facial sensation and strength were normal. Uvula tongue midline. Head turning and shoulder shrug  were normal and symmetric. Motor: The motor testing reveals 5 over 5 strength in the right upper and lower extremity.  3/5 strength in the left upper extremity-weakness also may be contributed to recent fall with bruising on the left arm.  4 out of 5 strength in the left lower extremity. Sensory: Sensory testing is intact to soft touch on all 4 extremities. No evidence of extinction is noted.  Coordination: Cerebellar testing reveals good finger-nose-finger on the right.  Dysmetria noted on the left.  Good heel-to-shin bilaterally.  Gait and station: Uses a Rollator when ambulating.  Broad-based slightly ataxic gait.  Tandem gait not attempted.   DIAGNOSTIC DATA (LABS, IMAGING, TESTING) - I reviewed patient records, labs, notes, testing and imaging myself where available.  Lab Results  Component Value Date   WBC 9.8 06/02/2021   HGB 14.8 06/02/2021   HCT 44.1 06/02/2021   MCV 96.5 06/02/2021   PLT 173 06/02/2021      Component Value Date/Time   NA 141 07/07/2021 1220   K 4.5 07/07/2021 1220   CL 110 07/07/2021 1220   CO2 24 07/07/2021 1220   GLUCOSE 136 (H) 07/07/2021 1220   BUN 23 07/07/2021 1220   CREATININE 0.98 07/07/2021 1220   CREATININE 1.07 (H) 05/28/2015 1230   CALCIUM 9.0 07/07/2021 1220   PROT 6.7 12/23/2018 1339   PROT 6.5 11/13/2014 1151   ALBUMIN 3.5 12/23/2018 1339   ALBUMIN 4.3 11/13/2014 1151   AST 23 12/23/2018 1339   ALT 17 12/23/2018 1339   ALKPHOS 84 12/23/2018 1339   BILITOT 0.5 12/23/2018 1339   BILITOT 0.5 11/13/2014 1151   GFRNONAA >60 07/07/2021 1220   GFRAA 50 (L) 11/05/2019 1244   Lab Results  Component Value Date   CHOL 173 03/31/2021   HDL 58 03/31/2021   LDLCALC 102 (H) 03/31/2021   LDLDIRECT 156.7 02/06/2014   TRIG 65  03/31/2021   CHOLHDL 3.0 03/31/2021   Lab Results  Component Value Date   HGBA1C 7.2 (H) 01/17/2019   No results found for: "VITAMINB12" Lab Results  Component Value Date   TSH 0.982 01/17/2019      ASSESSMENT AND PLAN 76 y.o. year old female  has a past medical history of Anemia, Anxiety, Asthma, Basal cell carcinoma (05/2014), Bundle branch block, left, CHF (congestive heart failure) ([place]), Chronic insomnia (05/06/2015), Chronic kidney disease, Chronic lower back pain, Claustrophobia, Common migraine (05/14/2014), Coronary artery disease, Depression, Diabetic peripheral neuropathy ([place]) (01/12/2019), GERD (gastroesophageal reflux disease), H/O hiatal hernia, Headache, Heart murmur, Hyperlipidemia, Hypertension, Memory change (05/14/2014), Migraine, Obesity (01-2010), Obstructive sleep apnea, On home oxygen therapy, Osteoarthritis, Peripheral vascular disease ([place]), PONV (postoperative nausea and vomiting), Stroke ([employer]), Type II diabetes mellitus ([place]), and Ventral hernia.   I had a long discussion with the patient regarding her remote stroke and longstanding gait and balance difficulties and answered questions.  Continue Eliquis for stroke prevention and Plavix for her coronary artery disease and maintain aggressive risk factor modification strict control of hypertension blood pressure goal below 130/90, lipids with LDL cholesterol goal below 70 mg percent and diabetes with hemoglobin A1c goal with a 6.5%.  She was encouraged to use her rollator at all times and we discussed fall and safety precautions.  She will return for follow-up in the future in 6 months with Vaughan Browner nurse practitioner or  call earlier if necessary.  Greater than 50% time during this 30-minute visit was spent in counseling and coordination of care about her remote stroke and gait and balance difficulties and answering questions.    Antony Contras, MD 08/24/2021, 1:22 PM [facility] [address]  [place], [place] [address] [phone]

## 2021-08-24 NOTE — Patient Instructions (Signed)
I had a long discussion with the patient regarding her remote stroke and longstanding gait and balance difficulties and answered questions.  Continue Eliquis for stroke prevention and Plavix for her coronary artery disease and maintain aggressive risk factor modification strict control of hypertension blood pressure goal below 130/90, lipids with LDL cholesterol goal below 70 mg percent and diabetes with hemoglobin A1c goal with a 6.5%.  She was encouraged to use her rollator at all times and we discussed fall and safety precautions.  She will return for follow-up in the future in 6 months with Vaughan Browner nurse practitioner or  call earlier if necessary. Fall Prevention in the Home, Adult Falls can cause injuries and can happen to people of all ages. There are many things you can do to make your home safe and to help prevent falls. Ask for help when making these changes. What actions can I take to prevent falls? General Instructions Use good lighting in all rooms. Replace any light bulbs that burn out. Turn on the lights in dark areas. Use night-lights. Keep items that you use often in easy-to-reach places. Lower the shelves around your home if needed. Set up your furniture so you have a clear path. Avoid moving your furniture around. Do not have throw rugs or other things on the floor that can make you trip. Avoid walking on wet floors. If any of your floors are uneven, fix them. Add color or contrast paint or tape to clearly mark and help you see: Grab bars or handrails. First and last steps of staircases. Where the edge of each step is. If you use a stepladder: Make sure that it is fully opened. Do not climb a closed stepladder. Make sure the sides of the stepladder are locked in place. Ask someone to hold the stepladder while you use it. Know where your pets are when moving through your home. What can I do in the bathroom?     Keep the floor dry. Clean up any water on the floor right  away. Remove soap buildup in the tub or shower. Use nonskid mats or decals on the floor of the tub or shower. Attach bath mats securely with double-sided, nonslip rug tape. If you need to sit down in the shower, use a plastic, nonslip stool. Install grab bars by the toilet and in the tub and shower. Do not use towel bars as grab bars. What can I do in the bedroom? Make sure that you have a light by your bed that is easy to reach. Do not use any sheets or blankets for your bed that hang to the floor. Have a firm chair with side arms that you can use for support when you get dressed. What can I do in the kitchen? Clean up any spills right away. If you need to reach something above you, use a step stool with a grab bar. Keep electrical cords out of the way. Do not use floor polish or wax that makes floors slippery. What can I do with my stairs? Do not leave any items on the stairs. Make sure that you have a light switch at the top and the bottom of the stairs. Make sure that there are handrails on both sides of the stairs. Fix handrails that are broken or loose. Install nonslip stair treads on all your stairs. Avoid having throw rugs at the top or bottom of the stairs. Choose a carpet that does not hide the edge of the steps on the stairs.  Check carpeting to make sure that it is firmly attached to the stairs. Fix carpet that is loose or worn. What can I do on the outside of my home? Use bright outdoor lighting. Fix the edges of walkways and driveways and fix any cracks. Remove anything that might make you trip as you walk through a door, such as a raised step or threshold. Trim any bushes or trees on paths to your home. Check to see if handrails are loose or broken and that both sides of all steps have handrails. Install guardrails along the edges of any raised decks and porches. Clear paths of anything that can make you trip, such as tools or rocks. Have leaves, snow, or ice cleared  regularly. Use sand or salt on paths during winter. Clean up any spills in your garage right away. This includes grease or oil spills. What other actions can I take? Wear shoes that: Have a low heel. Do not wear high heels. Have rubber bottoms. Feel good on your feet and fit well. Are closed at the toe. Do not wear open-toe sandals. Use tools that help you move around if needed. These include: Canes. Walkers. Scooters. Crutches. Review your medicines with your doctor. Some medicines can make you feel dizzy. This can increase your chance of falling. Ask your doctor what else you can do to help prevent falls. Where to find more information Centers for Disease Control and Prevention, [employer]: http://www.wolf.info/ National Institute on Aging: http://kim-miller.com/ Contact a doctor if: You are afraid of falling at home. You feel weak, drowsy, or dizzy at home. You fall at home. Summary There are many simple things that you can do to make your home safe and to help prevent falls. Ways to make your home safe include removing things that can make you trip and installing grab bars in the bathroom. Ask for help when making these changes in your home. This information is not intended to replace advice given to you by your health care provider. Make sure you discuss any questions you have with your health care provider. Document Revised: 10/20/2020 Document Reviewed: 08/22/2019 Elsevier Patient Education  [place].

## 2021-09-15 ENCOUNTER — Encounter (HOSPITAL_COMMUNITY): Payer: Self-pay | Admitting: Cardiology

## 2021-09-15 ENCOUNTER — Ambulatory Visit (HOSPITAL_COMMUNITY)
Admission: RE | Admit: 2021-09-15 | Discharge: 2021-09-15 | Disposition: A | Discharge status: Home / Self Care | Payer: Medicare Other | Source: Ambulatory Visit | Attending: Cardiology | Admitting: Cardiology

## 2021-09-15 ENCOUNTER — Other Ambulatory Visit (HOSPITAL_COMMUNITY): Payer: Self-pay | Admitting: Cardiology

## 2021-09-15 ENCOUNTER — Telehealth: Payer: Self-pay | Admitting: Pharmacist

## 2021-09-15 VITALS — BP 130/80 | HR 68 | Wt 230.4 lb

## 2021-09-15 DIAGNOSIS — I130 Hypertensive heart and chronic kidney disease with heart failure and stage 1 through stage 4 chronic kidney disease, or unspecified chronic kidney disease: Secondary | ICD-10-CM | POA: Insufficient documentation

## 2021-09-15 DIAGNOSIS — Z8249 Family history of ischemic heart disease and other diseases of the circulatory system: Secondary | ICD-10-CM | POA: Insufficient documentation

## 2021-09-15 DIAGNOSIS — Z8673 Personal history of transient ischemic attack (TIA), and cerebral infarction without residual deficits: Secondary | ICD-10-CM | POA: Diagnosis not present

## 2021-09-15 DIAGNOSIS — Z9981 Dependence on supplemental oxygen: Secondary | ICD-10-CM | POA: Insufficient documentation

## 2021-09-15 DIAGNOSIS — R053 Chronic cough: Secondary | ICD-10-CM | POA: Insufficient documentation

## 2021-09-15 DIAGNOSIS — Z794 Long term (current) use of insulin: Secondary | ICD-10-CM | POA: Diagnosis not present

## 2021-09-15 DIAGNOSIS — E1122 Type 2 diabetes mellitus with diabetic chronic kidney disease: Secondary | ICD-10-CM | POA: Insufficient documentation

## 2021-09-15 DIAGNOSIS — N189 Chronic kidney disease, unspecified: Secondary | ICD-10-CM | POA: Insufficient documentation

## 2021-09-15 DIAGNOSIS — I252 Old myocardial infarction: Secondary | ICD-10-CM | POA: Diagnosis not present

## 2021-09-15 DIAGNOSIS — Z7951 Long term (current) use of inhaled steroids: Secondary | ICD-10-CM | POA: Insufficient documentation

## 2021-09-15 DIAGNOSIS — F419 Anxiety disorder, unspecified: Secondary | ICD-10-CM | POA: Diagnosis not present

## 2021-09-15 DIAGNOSIS — Z7901 Long term (current) use of anticoagulants: Secondary | ICD-10-CM | POA: Insufficient documentation

## 2021-09-15 DIAGNOSIS — K219 Gastro-esophageal reflux disease without esophagitis: Secondary | ICD-10-CM | POA: Insufficient documentation

## 2021-09-15 DIAGNOSIS — I5032 Chronic diastolic (congestive) heart failure: Secondary | ICD-10-CM | POA: Diagnosis not present

## 2021-09-15 DIAGNOSIS — R002 Palpitations: Secondary | ICD-10-CM

## 2021-09-15 DIAGNOSIS — Z79899 Other long term (current) drug therapy: Secondary | ICD-10-CM | POA: Insufficient documentation

## 2021-09-15 DIAGNOSIS — Z951 Presence of aortocoronary bypass graft: Secondary | ICD-10-CM | POA: Insufficient documentation

## 2021-09-15 DIAGNOSIS — Z7902 Long term (current) use of antithrombotics/antiplatelets: Secondary | ICD-10-CM | POA: Insufficient documentation

## 2021-09-15 DIAGNOSIS — Z6841 Body Mass Index (BMI) 40.0 and over, adult: Secondary | ICD-10-CM | POA: Diagnosis not present

## 2021-09-15 DIAGNOSIS — Z881 Allergy status to other antibiotic agents status: Secondary | ICD-10-CM | POA: Insufficient documentation

## 2021-09-15 DIAGNOSIS — I5042 Chronic combined systolic (congestive) and diastolic (congestive) heart failure: Secondary | ICD-10-CM | POA: Insufficient documentation

## 2021-09-15 DIAGNOSIS — I447 Left bundle-branch block, unspecified: Secondary | ICD-10-CM | POA: Diagnosis not present

## 2021-09-15 DIAGNOSIS — I080 Rheumatic disorders of both mitral and aortic valves: Secondary | ICD-10-CM | POA: Diagnosis not present

## 2021-09-15 DIAGNOSIS — Z955 Presence of coronary angioplasty implant and graft: Secondary | ICD-10-CM | POA: Insufficient documentation

## 2021-09-15 DIAGNOSIS — E785 Hyperlipidemia, unspecified: Secondary | ICD-10-CM | POA: Diagnosis not present

## 2021-09-15 DIAGNOSIS — G4733 Obstructive sleep apnea (adult) (pediatric): Secondary | ICD-10-CM | POA: Diagnosis not present

## 2021-09-15 DIAGNOSIS — Z833 Family history of diabetes mellitus: Secondary | ICD-10-CM | POA: Insufficient documentation

## 2021-09-15 DIAGNOSIS — Z880 Allergy status to penicillin: Secondary | ICD-10-CM | POA: Insufficient documentation

## 2021-09-15 DIAGNOSIS — Z888 Allergy status to other drugs, medicaments and biological substances status: Secondary | ICD-10-CM | POA: Insufficient documentation

## 2021-09-15 DIAGNOSIS — I25119 Atherosclerotic heart disease of native coronary artery with unspecified angina pectoris: Secondary | ICD-10-CM | POA: Insufficient documentation

## 2021-09-15 DIAGNOSIS — I480 Paroxysmal atrial fibrillation: Secondary | ICD-10-CM | POA: Insufficient documentation

## 2021-09-15 LAB — BASIC METABOLIC PANEL
Anion gap: 9 (ref 5–15)
BUN: 20 mg/dL (ref 8–23)
CO2: 22 mmol/L (ref 22–32)
Calcium: 9.4 mg/dL (ref 8.9–10.3)
Chloride: 110 mmol/L (ref 98–111)
Creatinine, Ser: 0.84 mg/dL (ref 0.44–1.00)
GFR, Estimated: >60 mL/min (ref >60)
Glucose, Bld: 147 mg/dL — ABNORMAL HIGH (ref 70–99)
Potassium: 4.3 mmol/L (ref 3.5–5.1)
Sodium: 141 mmol/L (ref 135–145)

## 2021-09-15 LAB — BRAIN NATRIURETIC PEPTIDE: B Natriuretic Peptide: 195.9 pg/mL — ABNORMAL HIGH (ref 0.0–100.0)

## 2021-09-15 MED ORDER — ROSUVASTATIN CALCIUM 40 MG PO TABS: 40.0000 mg | ORAL_TABLET | Freq: Every day | ORAL | 3 refills | Status: DC

## 2021-09-15 MED ORDER — TORSEMIDE 20 MG PO TABS: 10.0000 mg | ORAL_TABLET | Freq: Every day | ORAL | 2 refills | Status: DC

## 2021-09-15 NOTE — Progress Notes (Signed)
ReDS Vest / Clip - 09/15/21 1200       ReDS Vest / Clip   Station Marker A    Ruler Value 29    ReDS Value Range Low volume    ReDS Actual Value 35

## 2021-09-15 NOTE — Telephone Encounter (Addendum)
Received referral from CHF clinic for [place] therapy for pt. She has Medicare insurance so they will not cover weight loss medications. She does have DM so Ozempic or Mounjaro could be options. A1c was well controlled at 6.3% on 03/30/21 at [facility]. Taking insulin for her DM.  Recent lipids drawn 03/2021 also mention pt was supposed to be referred back to PharmD for lipid management but do not see that referral was placed. At that time, her LDL had increased from the 30s-50s up to 102. Pt reported adherence to Crestor '40mg'$ , Praluent, and Zetia, although dispense report does not show her filling Crestor or Praluent in the past 6 months and nonadherence to these meds would explain increase in her LDL.  Called pt's pharmacy to confirm med adherence as well as pricing - since she takes multiple other branded meds, concerned that cost may be a barrier to [place].  Pt is currently filling her Zetia, however has not picked up Crestor since December 2022 and has not picked up Praluent at all. She is also in the donut hole - 3 month supply of her Delene Loll cost $535 in May and Eliquis cost $450.  Left message for pt to discuss. GLP1RA would be at least $150 a month with donut hole pricing. She also needs to restart her Crestor, a refill has been sent in for this. Praluent cost prohibitive to resume at this time.

## 2021-09-15 NOTE — Patient Instructions (Signed)
Labs done today, your results will be available in MyChart, we will contact you for abnormal readings.  Your provider has recommended that  you wear a Zio Patch for 14 days.  This monitor will record your heart rhythm for our review.  IF you have any symptoms while wearing the monitor please press the button.  If you have any issues with the patch or you notice a red or orange light on it please call the company at [phone].  Once you remove the patch please mail it back to the company as soon as possible so we can get the results.  You have been referred to the [place] for [facility]. They will call you to arrange your appointment.  You have been referred to cardiac Rehab. They will call you to arrange your appointment.  Your physician recommends that you schedule a follow-up appointment in: 6 weeks  If you have any questions or concerns before your next appointment please send [place] a message through [place] or call our office at [phone].    TO LEAVE A MESSAGE FOR THE NURSE SELECT OPTION 2, PLEASE LEAVE A MESSAGE INCLUDING: YOUR NAME DATE OF BIRTH CALL BACK NUMBER REASON FOR CALL**this is important as we prioritize the call backs  YOU WILL RECEIVE A CALL BACK THE SAME DAY AS LONG AS YOU CALL BEFORE 4:00 PM  At the [facility], you and your health needs are our priority. As part of our continuing mission to provide you with exceptional heart care, we have created designated Provider Care Teams. These Care Teams include your primary Cardiologist (physician) and Advanced Practice Providers (APPs- Physician Assistants and Nurse Practitioners) who all work together to provide you with the care you need, when you need it.   You may see any of the following providers on your designated Care Team at your next follow up: Dr Glori Bickers Dr Haynes Kerns, NP Lyda Jester, [place] [facility] [place], [place] Audry Riles,  PharmD   Please be sure to bring in all your medications bottles to every appointment.

## 2021-09-15 NOTE — Progress Notes (Signed)
Date:  09/15/2021   ID:  [name] [name], DOB 1945-08-07, [name] [id number]   Provider location: [facility] Type of Visit: Established patient   PCP:  [name] Late, MD  HF Cardiologist:  [name]. Aundra [name]  Chief Complaint: Follow up for CAD and CHF.   History of Present Illness: [name] [name] is a 76 y.o. female who has a history of CAD s/p CABG, diastolic CHF, and cerebrovascular disease with history of CVA presents for followup of CHF and diastolic CHF. She had CABG x 5 in 12/11.  Prior to the CABG she had multiple PCIs.  She had a CVA in 2/14 that presented as visual loss.  She had a left carotid stent in 2/15.    Left heart cath done in Sept. 2014. This showed patent SVG-D, LIMA-LAD, and sequential SVG-OM branches but SVG-RCA and the native RCA were both totally occluded. It was felt aht her increased symptoms coincided with occlusion of SVG-RCA.  She was started on Imdur to see if this would help with the chest pain and dyspnea.  However, she feels like Imdur causes leg cramps and does not think that she can take it.  So ranolazine 500 mg bid started and titrated this up to 1000 mg bid.  This helped some but not markedly.  Therefore, sent to  [name] [name] to address opening RCA CTO. He was able to do this in 5/15 with 4 overlapping Xience DES.  This led to resolution of exertional chest pain.     [name] [name] was started on Brilinta after PCI.  She became much more short of breath after starting on Brilinta. Per [name] [name] stopped and replaced with Plavix.  Dyspnea improved off Brilinta. Given increasing exertional dyspnea and chest pain,  RHC/LHC was done in 4/17.  This showed stable CAD with no interventional target.  Left and right heart filling pressures were not significantly increased.  Medical management.    She had an MRI/MRA in May 2018 that showed moderately severe stenosis of the supraclinoid segment of the right internal carotid artery, progressed since  the 2016 MR angiogram and severe basilar artery stenosis.    She developed recurrent exertional chest pain and had LHC in 6/18, showing 4/5 grafts patent with patent native RCA (similar to prior cath, no changes).     Echo 1/19 with EF 55-60%, moderate diastolic dysfunction, PASP 50 mmHg, mildly dilated RV, mild AS, mild-moderate MR.    She reported increased dyspnea and chest pain and had RHC/LHC in 9/19.  She had DES to sequential SVG-OM1/PLOM.  While hospitalized, she was noted to have runs of atrial fibrillation.  She was very symptomatic with the atrial fibrillation.  Eliquis and amiodarone were started.    She was admitted 12/16-12/19/19 for cardiomems implant and RHC. She was diuresed with IV lasix and transitioned to torsemide 100 mg BID + metolazone once/week. DC weight: 295 lbs.   She had a prolonged hospitalization in 2/20 with acute respiratory failure and fever to 103.  She was thought to have PNA and treated with cefepime.  She was intubated.  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  Amiodarone was stopped and she was put on prednisone. Echo in 2/20 showed EF down to 25-30% with mid-apical LV severe hypokinesis. She had AKI. Possible ITP triggered by infection, HIT negative.  ITP was treated with Solumedrol. She was discharged to SNF.    ORIF left ankle 4/20.  11/20 admission initially with concern for TIAs (staring spells), ended up thinking possible partial seizures.  Head MRI did not show CVA.  Echo in 11/20 showed EF 45-50%, moderately decreased RV systolic function, moderate RV enlargement, moderate-severe MR, moderate TR.   Patient was admitted again in 12/20 with AKI and hypotension in the setting of sinus bradycardia (HR 30s).  She was volume overloaded on exam.  Toprol XL was stopped and HR increased to 60s.  She was diuresed with IV Lasix.  TEE in 12/20 showed EF 50-55%, septal-lateral dyssynchrony, mildly decreased RV function, moderate MR.    Atypical chest pain in 1/21, Cardiolite showed EF 57%, fixed inferior defect (likely artifact), no ischemia.   Echo in 5/22 showed EF 60-65%, moderate LVH, normal RV, PASP 37 mmHg, 2.5 x 2.1 cm calcified mass posterior mitral annulus with mild-moderate MR => ?severe MAC but more circumscribed and prominent than in the past, mild AS mean gradient 12 mmHg and AVA 1.83 cm^2.  Echo in 11/22 showed EF 55%, normal RV, mass posterior mitral annulus, moderate MR, mild AS, IVC normal.  Cardiac MRI was done in 1/23 to investigate posterior mitral annulus mass; study showed LV EF 48%, mild LV dilation, normal RV with EF 53%, extensive MAC is likely the source of the posterior mitral annulus mass, basal-mid inferior >50% subendocardial LGE suggestive of prior inferior MI.   Follow up 2/23 she had chest tightness x 4 weeks. Arranged for [facility] which showed in-stent restenosis of pRCA, felt source of her symptoms given occluded SVG-RCA. Underwent PTCA to pRCA. RHC with normal PCWP and RA pressure, mild pulmonary hypertension, preserved CO. On ASA + Plavix + Eliquis x 1 month (until 05/21/21), then Plavix + Eliquis.   Today she returns for HF follow up. She continues to have chronic exertional symptoms, PCI in 3/23 did not really help this.  She remains very fatigued in general. She has a chronic dry cough, Protonix did not help this.  She has palpitations that are bothersome but do not cause lightheadedness.  Occasional atypical chest pain.  Still with dyspnea walking 100-200 feet.  Weight is up 6 lbs.   Cardiomems: PADP 7 on 8/1, most recent transmission attempts have not gone through.   REDs clip: 35%  ECG (personally reviewed): NSR, LBBB  Labs (6/14): K 3.8, creatinine 0.71 Labs (8/14): BNP 249, LDL 166 Labs (9/14): K 4.7, creatinine 0.7 Labs (9/14): K 4.6, creatinine 0.9, BNP 109 Labs (1/15): K 4.5, creatinine 0.73, LDL 212, HCT 43.4, TSH normal, BNP 138 Labs (6/15): K 4.7, creatinine 1.17, LDL 100, HDL  37, TGs 363, BNP 39 Labs (10/15): K 4.6, creatinine 1.2 Labs (1/16): LDL 157, HDL 43, K 5.2, creatinine 0.95 Labs (2/16): K 4.5, creatinine 1.08, BNP 113 Labs (4/16): LDL 50, HDL 56, TGs 179 Labs (9/16): K 5.3, creatinine 1.09 Labs (10/16): LDL 75, HDL 56 Labs (2/17): K 5.2, creatinine 1.07 Labs (4/17): K 5.1, creatinine 1.07 Labs (5/17): K 4.5, creatinine 1.01, LDL 96, HDL 56, BNP 70 Labs (03/2016) K 4.9, creatinine 1.07 Labs (7/18): K 4.9, creatinine 1.15 Labs (11/18): K 5.1, creatinine 1.11, LDL 36 Labs (1/19): K 4.9, creatinine 1.22 Labs (9/19): LDL 108 Labs (10/19): hgb 14.5, K 4.3, creatinine 1.03 Labs 12/06/2017: K 4.7 Creatinine 1.7 Labs (3/20): K 4.8, creatinine 1.39, hgb 12.4 plts 226 Labs (11/20): K 4.2, creatinine 1.01, LDL 38 Labs (12/20): K 3.5, creatinine 1.8 Labs (3/21): K 4.5, creatinine 1.5 Labs (2/22): K 4.2, creatinine 1.13 Labs (4/22): LDL  58 Labs (9/22): K 4.3, creatinine 1.27 Labs (12/22): K 4.4, creatinine 1.13 Labs (3/23): K 4.3, creatinine 1.02 Labs (5/23): K 4.3, creatinine 1.33 Labs (6/23): BNP 140, K 4.5, creatinine 0.98   PMH: 1. Diabetic gastroparesis 2. Type II diabetes 3. HTN 4. Morbid obesity 5. CAD: s/p CABG in 12/11 after prior PCIs.  LIMA-LAD, SVG-D, seq SVG-OM1 and OM2, SVG-PDA. Adenosine Cardiolite (8/14) with EF 53% and a small reversible apical defect with a medium, partially reversible inferior defect.  LHC (9/14) with patent SVG-D, LIMA-LAD (50% distal LAD), and sequential SVG-OM branches; the SVG-RCA and the native RCA were occluded.  This was managed medically initially, but with ongoing exertional chest pain, it was decided to open CTO.  Patient had CTO opening with 4 overlapping Xience DES in the RCA in 5/15.   - LHC (4/17): SVG-D patent, LIMA-LAD patent, distal LAD with several 40-50% stenoses, sequential SVG-OM1 and PLOM patent with 50% proximal stenosis (not flow limiting), SVG-RCA TO with patent RCA stents.  - LHC (6/18): 4/5 grafts  patent (SVG-RCA TO, same as past), RCA stents patent => no change.  - LHC (9/19): Sequential SVG-PLOM/OM1 with 80% proximal stenosis, s/p DES.  - Cardiolite (1/21): EF 57%, fixed inferior defect (likely artifact), no ischemia.  - LHC (3/23): severe CAD with 90% in-stent restenosis of pRCA, s/p PTCA to in-stent restenosis of pRCA 6. Atypical migraines 7. OHS/OSA: Intolerant of CPAP. Uses oxygen with exertion and at night.  8. GERD with hiatal hernia 9. OA 10. HFPEF: She has a Cardiomems device.  Echo (11/13) with EF 50-55%, grade II diastolic dysfunction, mild-moderate MR.  Echo (8/14) with EF 55-60%, grade II diastolic dysfunction, mildly increased aortic valve gradient (mean 12 mmHg) but valve opens well, mild MR and mild RV dilation.  Echo (9/15) with EF 60-65%, grade II diastolic dysfunction, mild aortic stenosis, mild mitral stenosis, mild to moderate mitral regurgitation, mildly dilated RV with normal systolic function, PA systolic pressure 46 mmHg.  - RHC (4/17): mean RA 9, PA 38/15 mean 27, mean PCWP 9, CI 2.5.  - Echo (5/17): EF 55-60%, mild LVH, mildly dilated RV with low normal systolic function, moderate TR, PASP 61 mmHg - Echo (1/19): EF 55-60%, moderate diastolic dysfunction, PASP 50 mmHg, mildly dilated RV, mild AS, mild-moderate MR. - RHC (9/19): mean RA 11, PA 34/12, mean PCWP 15, CI 2.85 - Echo (3/20): EF 25-30% with mid-apical severe hypokinesis.  - Echo (11/20): EF 45-50%, moderately dilated RV with moderately decreased systolic function, moderate-severe MR, moderate TR.  - TEE (12/20): EF 50-55%, septal-lateral dyssynchrony, moderate RV dilation/mildly decreased function, peak RV-RA gradient 51 mmHg, moderate MR.  - Echo (5/22): EF 60-65%, moderate LVH, normal RV, PASP 37 mmHg, 2.5 x 2.1 cm calcified mass posterior mitral annulus with mild-moderate MR => ?severe MAC but more circumscribed and prominent than in the past, mild AS mean gradient 12 mmHg and AVA 1.83 cm^2.  - Echo  (11/22): EF 55%, normal RV, mass posterior mitral annulus, moderate MR, mild AS, IVC normal. - Cardiac MRI in 1/23 showed LV EF 48%, mild LV dilation, normal RV with EF 53%, extensive MAC is likely the source of the posterior mitral annulus mass, basal-mid inferior >50% subendocardial LGE suggestive of prior inferior MI.  - RHC (3/23): normal PCWP (14) and RA pressure (2) , mild pulmonary hypertension, preserved CO/CI (6.51/3.28). 11. CKD 12. Chronic LBBB 13. Anxiety 14. Carotid stenosis: Followed by [facility], >15% LICA stenosis 17/61.  She had left carotid stent in  2/15. Carotid dopplers 8/15 with no significant disease. Carotid dopplers (4/16): < 40% RICA stenosis.  - Carotid dopplers (9/22): 1-39% BICA stenosis.  15. Cerebrovascular disease: CVA 2/14 with right posterior cerebral artery territory ischemic infarction. Cerebral angiogram in 6/14 showed 70% right vertebral ostial stenosis, 93-81% LICA stenosis, > 01% proximal left posterior cerebral artery stenosis, posterior communicating artery aneurysm.  Patient has had episodes of transient expressive aphasia.  Carotid dopplers (75/10) showed >25% LICA stenosis. She had a left carotid stent 2/15.  Possible CVA in 7/15. -  MRI/MRA in May 2018 that showed moderately severe stenosis of the supraclinoid segment of the right internal carotid artery, progressed since the 2016 MR angiogram and severe basilar artery stenosis.  16. Positional vertigo (suspected) 17. Palpitations: Holter (6/15) with rare PVCs/PACs.  - Event monitor (2/18): NSR, occasional PVCs 18. Dyspnea with Brilinta. 19. Melanoma: On face, s/p excision.   20. Aortic stenosis: Mild on 11/22 echo.  21. Holter (8/16) with no significant arrhythmias.  22. Lower extremity arterial dopplers (9/16) were normal.  23. Sleep study (4/18): No significant OSA.  24. Atrial fibrillation: Paroxysmal - Amiodarone stopped, ?lung toxicity.  25. H/o ITP in 3/20.  16. Partial seizures 17. Mitral  regurgitation: Moderate on TEE in 12/20.  - Mild to moderate on 5/22 echo.  - Moderate on 11/22 echo 18. Posterior mitral annular mass: Cardiac MRI was done in 1/23 to investigate posterior mitral annulus mass; study showed LV EF 48%, mild LV dilation, normal RV with EF 53%, extensive MAC is likely the source of the posterior mitral annulus mass, basal-mid inferior >50% subendocardial LGE suggestive of prior inferior MI.   Current Outpatient Medications  Medication Sig Dispense Refill   acetaminophen (TYLENOL) 500 MG tablet Take 500 mg by mouth every 6 (six) hours as needed for mild pain, moderate pain, fever or headache.     albuterol (VENTOLIN HFA) 108 (90 Base) MCG/ACT inhaler Inhale 2 puffs into the lungs every 4 (four) hours as needed for wheezing or shortness of breath.      Alirocumab (PRALUENT) 150 MG/ML SOAJ Inject 150 mg/mL into the skin every 14 (fourteen) days.     ALPRAZolam (XANAX) 0.5 MG tablet Take 1-2 tablets (0.5-1 mg total) by mouth See admin instructions. Take one tablet (0.5 mg) by mouth every morning and two tablets (1 mg) at night, may also take 1 tablet (0.5 mg) midday as needed for anxiety     amLODipine (NORVASC) 10 MG tablet Take 10 mg by mouth daily.     apixaban (ELIQUIS) 5 MG TABS tablet Take 1 tablet (5 mg total) by mouth 2 (two) times daily. 180 tablet 1   B Complex-C (B-COMPLEX WITH VITAMIN C) tablet Take 1 tablet by mouth daily.     bisacodyl (DULCOLAX) 5 MG EC tablet Take 5 mg by mouth daily as needed for moderate constipation.     buPROPion (WELLBUTRIN XL) 150 MG 24 hr tablet Take 150 mg by mouth every morning.     carboxymethylcellulose (REFRESH PLUS) 0.5 % SOLN 1 drop in the morning, at noon, and at bedtime.     cholecalciferol (VITAMIN D3) 25 MCG (1000 UNIT) tablet Take 1,000 Units by mouth daily.     clopidogrel (PLAVIX) 75 MG tablet Take 75 mg by mouth every morning.     denosumab (PROLIA) 60 MG/ML SOSY injection Inject 60 mg into the skin every 6 (six)  months.     DULoxetine (CYMBALTA) 30 MG capsule Take 1 capsule (30 mg  total) by mouth daily. 30 capsule 0   ezetimibe (ZETIA) 10 MG tablet Take 1 tablet (10 mg total) by mouth at bedtime.     fluocinonide cream (LIDEX) 4.16 % Apply 1 application. topically 2 (two) times daily as needed (irritation).     Fluticasone-Salmeterol (ADVAIR) 250-50 MCG/DOSE AEPB Inhale 1 puff into the lungs daily.     gabapentin (NEURONTIN) 600 MG tablet Take 600-1,200 mg by mouth See admin instructions. 600 mg in the morning, 1200 mg at bedtime, and may take an additional dose, 600 mg, throughout the day if needed     Insulin Glargine (LANTUS SOLOSTAR) 100 UNIT/ML Solostar Pen Inject 28-44 Units into the skin See admin instructions. Per sliding scale     insulin lispro (HUMALOG) 100 UNIT/ML KwikPen Inject 14-20 Units into the skin See admin instructions. Inject 14 units subcutaneously prior to breakfast and supper; add 4 units for CBG >200     ipratropium-albuterol (DUONEB) 0.5-2.5 (3) MG/3ML SOLN Take 3 mLs by nebulization 2 (two) times daily as needed (asthma).     loperamide (IMODIUM A-D) 2 MG tablet Take 2 mg by mouth 4 (four) times daily as needed for diarrhea or loose stools.     methocarbamol (ROBAXIN) 500 MG tablet Take 500 mg by mouth 3 (three) times daily as needed for muscle spasms.     Multiple Vitamin (MULTIVITAMIN WITH MINERALS) TABS tablet Take 1 tablet by mouth every morning. Centrum - Women over 55     Multiple Vitamins-Minerals (OCUVITE EYE HEALTH FORMULA) CAPS Take 1 capsule by mouth every morning.     nitroGLYCERIN (NITROSTAT) 0.3 MG SL tablet Place 1 tablet (0.3 mg total) under the tongue every 5 (five) minutes x 3 doses as needed for chest pain. 25 tablet 1   ondansetron (ZOFRAN-ODT) 4 MG disintegrating tablet Take 4 mg by mouth 2 (two) times daily as needed for nausea or vomiting.     OXYGEN Inhale 2 L into the lungs at bedtime as needed (shortness of breath).      pantoprazole (PROTONIX) 40 MG  tablet Take 1 tablet (40 mg total) by mouth daily. 30 tablet 0   potassium chloride SA (KLOR-CON) 20 MEQ tablet Take 1 tablet (20 mEq total) by mouth daily. 90 tablet 3   Pyridoxine HCl (VITAMIN B-6 PO) Take 1 tablet by mouth every morning.     ranolazine (RANEXA) 1000 MG SR tablet TAKE ONE TABLET BY MOUTH TWICE DAILY 180 tablet 3   sacubitril-valsartan (ENTRESTO) 97-103 MG Take 1 tablet by mouth 2 (two) times daily. 180 tablet 3   traZODone (DESYREL) 50 MG tablet Take 1 tablet (50 mg total) by mouth at bedtime. 90 tablet 1   vitamin B-12 (CYANOCOBALAMIN) 1000 MCG tablet Take 1,000 mcg by mouth every morning.      zolpidem (AMBIEN) 10 MG tablet Take 10 mg by mouth at bedtime as needed for sleep.     rosuvastatin (CRESTOR) 40 MG tablet Take 1 tablet (40 mg total) by mouth daily. 90 tablet 3   torsemide (DEMADEX) 20 MG tablet Take 0.5 tablets (10 mg total) by mouth daily. 15 tablet 2   No current facility-administered medications for this encounter.    Allergies:   Amoxicillin, Brilinta [ticagrelor], Erythromycin, Flagyl [metronidazole], Penicillins, Isosorbide mononitrate [isosorbide nitrate], Jardiance [empagliflozin], Metformin and related, and Tape   Social History:  The patient  reports that she has never smoked. She has never used smokeless tobacco. She reports that she does not drink alcohol and does not  use drugs.   Family History:  The patient's family history includes AAA (abdominal aortic aneurysm) in her father and mother; Cancer in her sister; Deep vein thrombosis in her father and mother; Dementia in her maternal grandmother and mother; Diabetes in her father, paternal aunt, paternal grandmother, paternal uncle, and paternal uncle; Heart attack in her father; Heart attack (age of onset: 21) in her paternal grandfather; Heart disease in her father and paternal uncle; Hyperlipidemia in her father; Hypertension in her father, mother, and sister.   ROS:  Please see the history of present  illness.   All other systems are personally reviewed and negative.   Recent Labs: 06/02/2021: Hemoglobin 14.8; Platelets 173 09/15/2021: B Natriuretic Peptide 195.9; BUN 20; Creatinine, Ser 0.84; Potassium 4.3; Sodium 141  Personally reviewed   Wt Readings from Last 3 Encounters:  09/15/21 104.5 kg (230 lb 6.4 oz)  08/24/21 103.6 kg (228 lb 6.4 oz)  07/07/21 101.9 kg (224 lb 9.6 oz)    BP 130/80   Pulse 68   Wt 104.5 kg (230 lb 6.4 oz)   SpO2 96%   BMI 41.34 kg/m   Physical Exam:   General: NAD Neck: No JVD, no thyromegaly or thyroid nodule.  Lungs: Clear to auscultation bilaterally with normal respiratory effort. CV: Nondisplaced PMI.  Heart regular S1/S2, no S3/S4, no murmur.  No peripheral edema.  No carotid bruit.  Normal pedal pulses.  Abdomen: Soft, nontender, no hepatosplenomegaly, no distention.  Skin: Intact without lesions or rashes.  Neurologic: Alert and oriented x 3.  Psych: Normal affect. Extremities: No clubbing or cyanosis.  HEENT: Normal.   Assessment & Plan: 1. CAD: Occluded native RCA and SVG-RCA.  Status post opening of CTO RCA with 4 overlapping Xience DES in 5/15.  DES to sequential SVG-OM1/PLOM in 9/19. LHC in 3/23 showed severe native coronary disease with 90% in-stent restenosis in the proximal RCA (patient has 2 layers of stent at this site). The sequential SVG-OM1/PLOM was patent, SVG-D was patent, and LIMA-LAD was patent. She underwent PTCA of pRCA. She continues to have occasional atypical chest pain.  - Continue apixaban and Plavix (has been on chronic Plavix, neuro has wanted her to stay on this for cerebrovascular disease).   - Off Imdur with headache. - Off Toprol XL with bradycardia.   - Continue ranolazine 1000 mg bid and amlodipine 10 mg daily for angina.  - She is now interested in doing cardiac rehab.  I will try to arrange.  2. Chronic systolic => diastolic CHF: Echo in 4/40 with EF 55-60%, moderate diastolic dysfunction. Cardiomems placed  01/16/18.  Echo in 3/20 in setting of severe PNA with intubation showed EF 25-30%, mid-apical LV severe hypokinesis.  Possible stress (Takotsubo-type) cardiomyopathy related to severe medical illness versus progression of CAD.  She has baseline chronic LBBB which could play a role in cardiomyopathy as well (LBBB cardiomyopathy).  Repeat echo in 11/20 with EF up to 45-50%, moderately dysfunctional RV.  TEE in 12/20 with EF 50-55% with septal-lateral dyssynchrony and mildly dysfunctional RV.  Most recent echo in 11/22 with EF 55% with normal RV.  cMRI in 1/23 with EF 48% and evidence for prior inferior MI.  NYHA class III symptoms, she did not improve post-PTCA RCA.   She does not appear volume overloaded on exam, but REDS clip borderline elevated at 35%.  She has been having trouble sending Cardiomems transmissions. - Increase torsemide back to 10 mg daily. BMET/BNP today, repeat BMET in 10 days.  -  I asked her to call Cardiomems customer support to see if they can help troubleshoot her connectivity issues with her pillow. We were able to optimize her fluid levels well when she was sending regular readings. - Continue spironolactone 25 mg daily.  - Continue Entresto 97/103 mg bid.  - Cannot tolerate SGLT2i. 3. Hyperlipidemia: She remains on Zetia, Crestor and Praluent. Followed in lipid clinic.  - Check lipids today.  4. Hypertension: BP controlled.  5. Cerebrovascular disease: She has history of CVA and had left carotid stent in 2/15. Followed by [facility].  Most recent MRA head showed severe stenosis of supraclinoid RICA and severe basilar artery stenosis.  - Neurology would like her to continue Plavix for this despite concomitant apixaban use.  6. OHS/OSA: Cannot tolerate CPAP.  Uses oxygen at night.  7. Atrial fibrillation: Paroxysmal. 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 NSR today.  She has episodes of palpitations that are bothersome but do not cause lightheadedness.  -  Continue apixaban 5 mg bid.  - I will arrange for 14 day Zio monitor to assess for atrial fibrillation or other trigger of palpitations.  8. Obesity: Body mass index is 41.34 kg/m. - I will refer to pharmacy clinic for semaglutide.  9. Mitral regurgitation: Moderate on 11/22 echo.  10. Bradycardia: Now off Toprol XL.   11. Aortic stenosis: Mild on 11/22 echo.  12. Mitral annular mass: This is a well-circumscribed calcified posterior mitral annulus mass noted on 5/22 and 11/22 echoes.  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 13. Chronic cough: Dry cough.  Protonix has not helped.  ?Related to volume overload.  - Increasing torsemide as above.  - CXR today.   Followup 6 wks with APP  Signed, Loralie Champagne, MD  09/15/2021  [facility] [address] [facility] [place] [address] [phone] (office) [phone] (fax)

## 2021-09-15 NOTE — Progress Notes (Signed)
Zio patch placed onto patient.  All instructions and information reviewed with patient, they verbalize understanding with no questions. 

## 2021-09-16 MED ORDER — PRALUENT 150 MG/ML ~~LOC~~ SOAJ: 150.0000 mg | SUBCUTANEOUS | 3 refills | Status: DC

## 2021-09-16 MED ORDER — SEMAGLUTIDE(0.25 OR 0.5MG/DOS) 2 MG/3ML ~~LOC~~ SOPN: PEN_INJECTOR | SUBCUTANEOUS | 1 refills | Status: DC

## 2021-09-16 MED ORDER — EZETIMIBE 10 MG PO TABS: 10.0000 mg | ORAL_TABLET | Freq: Every day | ORAL | 3 refills | Status: DC

## 2021-09-16 NOTE — Telephone Encounter (Signed)
Spoke with pt. She is interested in trying Ozempic. Her DM is followed by Dr Buddy Duty at [facility] but she wishes for our office to manage Ozempic. Thinks she may be through the donut hole soon and into catastrophic coverage. Will try sending in rx to confirm price.  Reports she's in a [place] for Entresto so hasn't had to pay for that, but her Eliquis is expensive. Reports she had extra rosuvastatin at home from when she was hospitalized. Does need a refill on Praluent.  Rx sent to pharmacy and will follow up with pt once copay info is available.

## 2021-09-16 NOTE — Telephone Encounter (Signed)
Called pharmacy, Ozempic is [phone] for a 1 month supply, Praluent is $78.80 for a 3 month supply. Pt is ok with these copays, reports on refill she should be in catastrophic coverage and cost should decrease. Scheduled appt with PharmD tomorrow for Ozempic start and training.

## 2021-09-17 ENCOUNTER — Ambulatory Visit (INDEPENDENT_AMBULATORY_CARE_PROVIDER_SITE_OTHER): Payer: Medicare Other | Admitting: Pharmacist

## 2021-09-17 VITALS — Wt 231.0 lb

## 2021-09-17 DIAGNOSIS — Z794 Long term (current) use of insulin: Secondary | ICD-10-CM | POA: Diagnosis not present

## 2021-09-17 DIAGNOSIS — I208 Other forms of angina pectoris: Secondary | ICD-10-CM | POA: Diagnosis not present

## 2021-09-17 DIAGNOSIS — E118 Type 2 diabetes mellitus with unspecified complications: Secondary | ICD-10-CM | POA: Diagnosis not present

## 2021-09-17 DIAGNOSIS — E782 Mixed hyperlipidemia: Secondary | ICD-10-CM | POA: Diagnosis not present

## 2021-09-17 NOTE — Patient Instructions (Addendum)
Ozempic Counseling Points This medication reduces your appetite and may make you feel fuller longer.  Stop eating when your body tells you that you are full. This will likely happen sooner than you are used to. Store your medication in the fridge until you are ready to use it. Inject your medication in the fatty tissue of your lower abdominal area (2 inches away from belly button) or upper outer thigh. Rotate injection sites. Each pen will last you about 1 month (the first month it will last a few weeks longer). Use a different needle with each weekly injection. Common side effects include: nausea, diarrhea/constipation, and heartburn, and are more likely to occur if you overeat.  Dosing schedule: - Month 1: Inject Ozempic 0.'25mg'$  subcutaneously once weekly for 4 weeks - Month 2: Inject Ozepmic 0.'5mg'$  subcutaneously once weekly for 6 weeks - Month 3: Inject Ozempic '1mg'$  subcutaneously once weekly for 4 weeks - Month 4: Inject Ozempic '2mg'$  subcutaneously once weekly  Call clinic with any concerns [phone]  Tips for living a healthier life     Building a Healthy and Balanced Diet Make most of your meal vegetables and fruits -  of your plate. Aim for color and variety, and remember that potatoes don't count as vegetables on the Healthy Eating Plate because of their negative impact on blood sugar.  Go for whole grains -  of your plate. Whole and intact grains--whole wheat, barley, wheat berries, quinoa, oats, brown rice, and foods made with them, such as whole wheat pasta--have a milder effect on blood sugar and insulin than white bread, white rice, and other refined grains.  Protein power -  of your plate. Fish, poultry, beans, and nuts are all healthy, versatile protein sources--they can be mixed into salads, and pair well with vegetables on a plate. Limit red meat, and avoid processed meats such as bacon and sausage.  Healthy plant oils - in moderation. Choose healthy vegetable  oils like olive, canola, soy, corn, sunflower, peanut, and others, and avoid partially hydrogenated oils, which contain unhealthy trans fats. Remember that low-fat does not mean "healthy."  Drink water, coffee, or tea. Skip sugary drinks, limit milk and dairy products to one to two servings per day, and limit juice to a small glass per day.  Stay active. The red figure running across the [place] is a reminder that staying active is also important in weight control.  The main message of the Healthy Eating Plate is to focus on diet quality:  The type of carbohydrate in the diet is more important than the amount of carbohydrate in the diet, because some sources of carbohydrate--like vegetables (other than potatoes), fruits, whole grains, and beans--are healthier than others. The Healthy Eating Plate also advises consumers to avoid sugary beverages, a major source of calories--usually with little nutritional value--in the American diet. The Healthy Eating Plate encourages consumers to use healthy oils, and it does not set a maximum on the percentage of calories people should get each day from healthy sources of fat. In this way, the Healthy Eating Plate recommends the opposite of the low-fat message promoted for decades by the USDA.  DeskDistributor.no  SUGAR  Sugar is a huge problem in the modern day diet. Sugar is a big contributor to heart disease, diabetes, high triglyceride levels, fatty liver disease and obesity. Sugar is hidden in almost all packaged foods/beverages. Added sugar is extra sugar that is added beyond what is naturally found and has no nutritional benefit for  your body. The American Heart Association recommends limiting added sugars to no more than 25g for women and 36 grams for men per day. There are many names for sugar including maltose, sucrose (names ending in "ose"), high fructose corn syrup, molasses,  cane sugar, corn sweetener, raw sugar, syrup, honey or fruit juice concentrate.   One of the best ways to limit your added sugars is to stop drinking sweetened beverages such as soda, sweet tea, and fruit juice.  There is 65g of added sugars in one 20oz bottle of Coke! That is equal to 7.5 donuts.   Pay attention and read all nutrition facts labels. Below is an examples of a nutrition facts label. The #1 is showing you the total sugars where the # 2 is showing you the added sugars. This one serving has almost the max amount of added sugars per day!     20 oz Soda 65g Sugar = 7.5 Glazed Donuts  16oz Energy  Drink 54g Sugar = 6.5 Glazed Donuts  Large Sweet  Tea 38g Sugar = 4 Glazed Donuts  20oz Sports  Drink 34g Sugar = 3.5 Glazed Donuts  8oz Chocolate Milk 24g Sugar =2.5 Glazed Donuts  8oz Orange  Juice 21g Sugar = 2 Glazed Donuts  1 Juice Box 14g Sugar = 1.5 Glazed Donuts  16oz Water= NO SUGAR!!  EXERCISE  Exercise is good. We've all heard that. In an ideal world, we would all have time and resources to get plenty of it. When you are active, your heart pumps more efficiently and you will feel better.  Multiple studies show that even walking regularly has benefits that include living a longer life. The American Heart Association recommends 150 minutes per week of exercise (30 minutes per day most days of the week). You can do this in any increment you wish. Nine or more 10-minute walks count. So does an hour-long exercise class. Break the time apart into what will work in your life. Some of the best things you can do include walking briskly, jogging, cycling or swimming laps. Not everyone is ready to "exercise." Sometimes we need to start with just getting active. Here are some easy ways to be more active throughout the day:  Take the stairs instead of the elevator  Go for a 10-15 minute walk during your lunch break (find a friend to make it more enjoyable)  When shopping,  park at the back of the parking lot  If you take public transportation, get off one stop early and walk the extra distance  Pace around while making phone calls  Check with your doctor if you aren't sure what your limitations may be. Always remember to drink plenty of water when doing any type of exercise. Don't feel like a failure if you're not getting the 90-150 minutes per week. If you started by being a couch potato, then just a 10-minute walk each day is a huge improvement. Start with little victories and work your way up.   HEALTHY EATING TIPS  When looking to improve your eating habits, whether to lose weight, lower blood pressure or just be healthier, it helps to know what a serving size is.   Grains 1 slice of bread,  bagel,  cup pasta or rice  Vegetables 1 cup fresh or raw vegetables,  cup cooked or canned Fruits 1 piece of medium sized fruit,  cup canned,   Meats/Proteins  cup dried       1 oz meat, 1 egg,  cup cooked beans, nuts or seeds  Dairy        Fats Individual yogurt container, 1 cup (8oz)    1 teaspoon margarine/butter or vegetable  milk or milk alternative, 1 slice of cheese          oil; 1 tablespoon mayonnaise or salad dressing                  Plan ahead: make a menu of the meals for a week then create a grocery list to go with that menu. Consider meals that easily stretch into a night of leftovers, such as stews or casseroles. Or consider making two of your favorite meal and put one in the freezer for another night. Try a night or two each week that is "meatless" or "no cook" such as salads. When you get home from the grocery store wash and prepare your vegetables and fruits. Then when you need them they are ready to go.   Tips for going to the grocery store:  [place] store or generic brands  Check the weekly ad from your store on-line or in their in-store flyer  Look at the unit price on the shelf tag to compare/contrast the costs of different items  Buy  fruits/vegetables in season  Carrots, bananas and apples are low-cost, naturally healthy items  If meats or frozen vegetables are on sale, buy some extras and put in your freezer  Limit buying prepared or "ready to eat" items, even if they are pre-made salads or fruit snacks  Do not shop when you're hungry  Foods at eye level tend to be more expensive. Look on the high and low shelves for deals.  Consider shopping at the farmer's market for fresh foods in season.  Avoid the cookie and chip aisles (these are expensive, high in calories and low in nutritional value). Shop on the outside of the grocery store.  Healthy food preparations:  If you can't get lean hamburger, be sure to drain the fat when cooking  Steam, saut (in olive oil), grill or bake foods  Experiment with different seasonings to avoid adding salt to your foods. Kosher salt, sea salt and Himalayan salt are all still salt and should be avoided. Try seasoning food with onion, garlic, thyme, rosemary, basil ect. Onion powder or garlic powder is ok. Avoid if it says salt (ie garlic salt).

## 2021-09-17 NOTE — Progress Notes (Signed)
Patient ID: [name] [name]                 DOB: 11-Apr-1945                    [name]: [id number]     HPI: [name] [name] is a 76 y.o. female patient referred to pharmacy clinic by Dr [name] to initiate weight loss therapy with GLP1-RA. PMH is significant for extensive CAD s/p CABG and multiple PCIs, diastolic CHF, CVA, afib, W0JW, obesity, OSA, and GERD. Most recent BMI 41. Saw CHF clinic on 8/15. Reported chronic exertional symptoms, fatigued in general, and dyspnea walking 100-200 feet. Weight was up 6 lbs at that visit.   I spoke with pt on 8/15 after referral to discuss [place]. Her formulary covers [place] but she is in the donut hole from taking multiple other branded medications (Praluent, Entresto, Eliquis, etc). Pt reported receiving patient assistance for her Entresto, although Eliquis was almost $500 for a 3 month supply. Thinks she is almost through the donut hole. Also needed a refill on her Praluent. Checked with her pharmacy, Praluent was $79 for a 3 month supply and Ozempic was $178 for a 1 month supply which pt was ok with. Dr Buddy Duty at [facility] follows her DM (A1c well controlled at 6.3% on just insulin), she prefers for [place] to initiate and manage her GLP.  Pt presents for follow up today. Picked up Ozempic from the pharmacy yesterday and brings in her pen today. Sees Dr Buddy Duty for DM follow up next week. Fasting glucose have been 140-160 in the AM and 130-180 in the PM. Has sliding scale for both her Lantus and Humalog.   Had been out of her Praluent when her labs were checked in February which explains increase in her LDL from 50s to 102.  Diet:  Eats twice a day -Brunch: egg, piece of toast or banana sandwich, fruit -Dinner: grilled chicken breast, hamburger steak, vegetables -Snacks: none -Drinks: water, diet soda  Exercise: minimal due to CHF symptoms  Family History: AAA (abdominal aortic aneurysm) in her father and mother; Cancer in her sister; Deep vein thrombosis in her father and  mother; Dementia in her maternal grandmother and mother; Diabetes in her father, paternal aunt, paternal grandmother, paternal uncle, and paternal uncle; Heart attack in her father; Heart attack (age of onset: 54) in her paternal grandfather; Heart disease in her father and paternal uncle; Hyperlipidemia in her father; Hypertension in her father, mother, and sister.   Social History: The patient  reports that she has never smoked. She has never used smokeless tobacco. She reports that she does not drink alcohol and does not use drugs.   Labs: Lab Results  Component Value Date   HGBA1C 7.2 (H) 01/17/2019    Wt Readings from Last 1 Encounters:  09/15/21 230 lb 6.4 oz (104.5 kg)    BP Readings from Last 1 Encounters:  09/15/21 130/80   Pulse Readings from Last 1 Encounters:  09/15/21 68       Component Value Date/Time   CHOL 173 03/31/2021 1303   CHOL 164 11/13/2014 1151   TRIG 65 03/31/2021 1303   HDL 58 03/31/2021 1303   HDL 56 11/13/2014 1151   CHOLHDL 3.0 03/31/2021 1303   VLDL 13 03/31/2021 1303   LDLCALC 102 (H) 03/31/2021 1303   LDLCALC 75 11/13/2014 1151   LDLDIRECT 156.7 02/06/2014 1014    Past Medical History:  Diagnosis Date   Anemia  hx   Anxiety    Asthma    Basal cell carcinoma 05/2014   "left shoulder"   Bundle branch block, left    chronic/notes 07/18/2013   CHF (congestive heart failure) (HCC)    Chronic insomnia 05/06/2015   Chronic kidney disease    frequency, sees dr Jamal Maes every 4 to 6 months (01/16/2018)   Chronic lower back pain    Claustrophobia    Common migraine 05/14/2014   Coronary artery disease    MI in 2001, 2002, 2006, 2011, 2014   Depression    Diabetic peripheral neuropathy ([place]) 01/12/2019   GERD (gastroesophageal reflux disease)    H/O hiatal hernia    Headache    "at least 2/month" (01/16/2018)   Heart murmur    Hyperlipidemia    Hypertension    Memory change 05/14/2014   Migraine    "5-6/year"  (01/16/2018)    Obesity 01-2010   Obstructive sleep apnea    "can't wear machine; I have claustrophobia" (01/16/2018), states she had a 2nd sleep study and does not have sleep apnea, her O2 decreases and now is on 2 L of O2 at night.   On home oxygen therapy    "2L at night and prn during daytime" (01/16/2018)   Osteoarthritis    "knees and hands" (01/16/2018)   Peripheral vascular disease ([place])    ? numbness, tingling arms and legs   PONV (postoperative nausea and vomiting)    Stroke ([place])    2014, 2015, 2016   Type II diabetes mellitus ([place])    Ventral hernia    hx of    Current Outpatient Medications on File Prior to Visit  Medication Sig Dispense Refill   acetaminophen (TYLENOL) 500 MG tablet Take 500 mg by mouth every 6 (six) hours as needed for mild pain, moderate pain, fever or headache.     albuterol (VENTOLIN HFA) 108 (90 Base) MCG/ACT inhaler Inhale 2 puffs into the lungs every 4 (four) hours as needed for wheezing or shortness of breath.      Alirocumab (PRALUENT) 150 MG/ML SOAJ Inject 150 mg into the skin every 14 (fourteen) days. 6 mL 3   ALPRAZolam (XANAX) 0.5 MG tablet Take 1-2 tablets (0.5-1 mg total) by mouth See admin instructions. Take one tablet (0.5 mg) by mouth every morning and two tablets (1 mg) at night, may also take 1 tablet (0.5 mg) midday as needed for anxiety     amLODipine (NORVASC) 10 MG tablet Take 10 mg by mouth daily.     apixaban (ELIQUIS) 5 MG TABS tablet Take 1 tablet (5 mg total) by mouth 2 (two) times daily. 180 tablet 1   B Complex-C (B-COMPLEX WITH VITAMIN C) tablet Take 1 tablet by mouth daily.     bisacodyl (DULCOLAX) 5 MG EC tablet Take 5 mg by mouth daily as needed for moderate constipation.     buPROPion (WELLBUTRIN XL) 150 MG 24 hr tablet Take 150 mg by mouth every morning.     carboxymethylcellulose (REFRESH PLUS) 0.5 % SOLN 1 drop in the morning, at noon, and at bedtime.     cholecalciferol (VITAMIN D3) 25 MCG (1000 UNIT) tablet Take 1,000 Units by mouth  daily.     clopidogrel (PLAVIX) 75 MG tablet Take 75 mg by mouth every morning.     denosumab (PROLIA) 60 MG/ML SOSY injection Inject 60 mg into the skin every 6 (six) months.     DULoxetine (CYMBALTA) 30 MG capsule Take 1 capsule (  30 mg total) by mouth daily. 30 capsule 0   ezetimibe (ZETIA) 10 MG tablet Take 1 tablet (10 mg total) by mouth at bedtime. 90 tablet 3   fluocinonide cream (LIDEX) 1.49 % Apply 1 application. topically 2 (two) times daily as needed (irritation).     Fluticasone-Salmeterol (ADVAIR) 250-50 MCG/DOSE AEPB Inhale 1 puff into the lungs daily.     gabapentin (NEURONTIN) 600 MG tablet Take 600-1,200 mg by mouth See admin instructions. 600 mg in the morning, 1200 mg at bedtime, and may take an additional dose, 600 mg, throughout the day if needed     Insulin Glargine (LANTUS SOLOSTAR) 100 UNIT/ML Solostar Pen Inject 28-44 Units into the skin See admin instructions. Per sliding scale     insulin lispro (HUMALOG) 100 UNIT/ML KwikPen Inject 14-20 Units into the skin See admin instructions. Inject 14 units subcutaneously prior to breakfast and supper; add 4 units for CBG >200     ipratropium-albuterol (DUONEB) 0.5-2.5 (3) MG/3ML SOLN Take 3 mLs by nebulization 2 (two) times daily as needed (asthma).     loperamide (IMODIUM A-D) 2 MG tablet Take 2 mg by mouth 4 (four) times daily as needed for diarrhea or loose stools.     methocarbamol (ROBAXIN) 500 MG tablet Take 500 mg by mouth 3 (three) times daily as needed for muscle spasms.     Multiple Vitamin (MULTIVITAMIN WITH MINERALS) TABS tablet Take 1 tablet by mouth every morning. Centrum - Women over 55     Multiple Vitamins-Minerals (OCUVITE EYE HEALTH FORMULA) CAPS Take 1 capsule by mouth every morning.     nitroGLYCERIN (NITROSTAT) 0.3 MG SL tablet Place 1 tablet (0.3 mg total) under the tongue every 5 (five) minutes x 3 doses as needed for chest pain. 25 tablet 1   ondansetron (ZOFRAN-ODT) 4 MG disintegrating tablet Take 4 mg by  mouth 2 (two) times daily as needed for nausea or vomiting.     OXYGEN Inhale 2 L into the lungs at bedtime as needed (shortness of breath).      pantoprazole (PROTONIX) 40 MG tablet Take 1 tablet (40 mg total) by mouth daily. 30 tablet 0   potassium chloride SA (KLOR-CON) 20 MEQ tablet Take 1 tablet (20 mEq total) by mouth daily. 90 tablet 3   Pyridoxine HCl (VITAMIN B-6 PO) Take 1 tablet by mouth every morning.     ranolazine (RANEXA) 1000 MG SR tablet TAKE ONE TABLET BY MOUTH TWICE DAILY 180 tablet 3   rosuvastatin (CRESTOR) 40 MG tablet Take 1 tablet (40 mg total) by mouth daily. 90 tablet 3   sacubitril-valsartan (ENTRESTO) 97-103 MG Take 1 tablet by mouth 2 (two) times daily. 180 tablet 3   Semaglutide,0.25 or 0.'5MG'$ /DOS, 2 MG/3ML SOPN Inject 0.'25mg'$  SQ once weekly x 4 weeks, then increase to 0.'5mg'$  SQ once weekly 3 mL 1   torsemide (DEMADEX) 20 MG tablet Take 0.5 tablets (10 mg total) by mouth daily. 15 tablet 2   traZODone (DESYREL) 50 MG tablet Take 1 tablet (50 mg total) by mouth at bedtime. 90 tablet 1   vitamin B-12 (CYANOCOBALAMIN) 1000 MCG tablet Take 1,000 mcg by mouth every morning.      zolpidem (AMBIEN) 10 MG tablet Take 10 mg by mouth at bedtime as needed for sleep.     No current facility-administered medications on file prior to visit.    Allergies  Allergen Reactions   Amoxicillin Shortness Of Breath and Rash   Brilinta [Ticagrelor] Shortness Of Breath  Erythromycin Shortness Of Breath, Other (See Comments) and Hives    Trouble swallowing   Flagyl [Metronidazole] Shortness Of Breath and Palpitations   Penicillins Hives, Shortness Of Breath, Rash and Other (See Comments)    Has patient had a PCN reaction causing immediate rash, facial/tongue/throat swelling, SOB or lightheadedness with hypotension: Yes Has patient had a PCN reaction causing severe rash involving mucus membranes or skin necrosis: No Has patient had a PCN reaction that required hospitalization: Yes Has  patient had a PCN reaction occurring within the last 10 years: No If all of the above answers are "NO", then may proceed with Cephalosporin use.    Isosorbide Mononitrate [Isosorbide Nitrate] Other (See Comments)    Joint aches, muscles hurt, difficult to walk    Jardiance [Empagliflozin] Other (See Comments)    Nausea, joint aches, muscles aches   Metformin And Related Other (See Comments)    Stomach pain, cold sweats, joint pain, burred vision, dizziness   Tape Other (See Comments)    Skin pulls off with certain types Plastic tape causes skin to rip if left on for long periods of time     Assessment/Plan:  1. Weight loss/DM - Pt with T2DM on insulin and A1c well controlled at 6.3%. Discussed additional cardiovascular and weight loss benefits with initiating GLP1RA therapy. Pt agreeable to start Ozempic 0.'25mg'$  SQ weekly. She sees her endocrinologist next week and will review sliding scale insulin with him, but current glucose readings do not warrant pre-emptive change in insulin with Ozempic initation. Pt aware to keep close eye on glucose at home.   Advised patient on common side effects including nausea, diarrhea, dyspepsia, decreased appetite, and fatigue. Counseled patient on reducing meal size and how to titrate medication to minimize side effects. Counseled patient to call if intolerable side effects or if experiencing dehydration, abdominal pain, or dizziness. Patient will adhere to dietary modifications and will target at least 150 minutes of moderate intensity exercise weekly.   Injection technique reviewed at today's visit and patient successfully self-administered first dose of Ozempic into the fatty tissue of the abdomen.  Titration Plan:  Will plan to follow the titration plan as below, pending patient is tolerating each dose before increasing to the next. Can slow titration if needed for tolerability.    - Month 1: Inject Ozempic 0.'25mg'$  subcutaneously once weekly for 4  weeks - Month 2: Inject Ozepmic 0.'5mg'$  subcutaneously once weekly for 6 weeks - Month 3: Inject Ozempic '1mg'$  subcutaneously once weekly for 4 weeks - Month 4: Inject Ozempic '2mg'$  subcutaneously once weekly  Follow up in 1 month by phone for dose titration.  2. Hyperlipidemia - Pt resuming Praluent '150mg'$  Q2W today. Continues on rosuvastatin '40mg'$  daily and ezetimibe '10mg'$  daily. LDL previously at goal < 55 on triple therapy, had increased to 102 when she was off of Praluent.  Willeen Novak E. Makeda Peeks, PharmD, BCACP, [facility] [address], [place], [place] [address] Phone: [phone]; Fax: [phone] 09/17/2021 3:07 PM

## 2021-09-25 ENCOUNTER — Ambulatory Visit (HOSPITAL_COMMUNITY)
Admission: RE | Admit: 2021-09-25 | Discharge: 2021-09-25 | Disposition: A | Discharge status: Home / Self Care | Payer: Medicare Other | Source: Ambulatory Visit | Attending: Cardiology | Admitting: Cardiology

## 2021-09-25 DIAGNOSIS — I5042 Chronic combined systolic (congestive) and diastolic (congestive) heart failure: Secondary | ICD-10-CM | POA: Diagnosis present

## 2021-09-25 LAB — BASIC METABOLIC PANEL
Anion gap: 6 (ref 5–15)
BUN: 18 mg/dL (ref 8–23)
CO2: 26 mmol/L (ref 22–32)
Calcium: 9.2 mg/dL (ref 8.9–10.3)
Chloride: 108 mmol/L (ref 98–111)
Creatinine, Ser: 0.99 mg/dL (ref 0.44–1.00)
GFR, Estimated: >60 mL/min (ref >60)
Glucose, Bld: 164 mg/dL — ABNORMAL HIGH (ref 70–99)
Potassium: 4.6 mmol/L (ref 3.5–5.1)
Sodium: 140 mmol/L (ref 135–145)

## 2021-09-28 ENCOUNTER — Other Ambulatory Visit (HOSPITAL_COMMUNITY): Payer: Medicare Other

## 2021-10-07 ENCOUNTER — Ambulatory Visit (HOSPITAL_COMMUNITY)
Admission: RE | Admit: 2021-10-07 | Discharge: 2021-10-07 | Disposition: A | Discharge status: Home / Self Care | Payer: Medicare Other | Source: Ambulatory Visit | Attending: Cardiology | Admitting: Cardiology

## 2021-10-07 DIAGNOSIS — I6529 Occlusion and stenosis of unspecified carotid artery: Secondary | ICD-10-CM | POA: Insufficient documentation

## 2021-10-07 DIAGNOSIS — I6521 Occlusion and stenosis of right carotid artery: Secondary | ICD-10-CM

## 2021-10-08 ENCOUNTER — Telehealth (HOSPITAL_COMMUNITY): Payer: Self-pay

## 2021-10-08 NOTE — Telephone Encounter (Signed)
Insurance    Pt insurance is active and benefits verified through Medicare a/b Co-pay 0, DED $226/$226 met, out of pocket 0/0 met, co-insurance 20%. no pre-authorization required. Passport, 10/08/2021_0 :27am, REF# [id number]   How many CR sessions are covered? (72 sessions for ICR)72 Is this a lifetime maximum or an annual maximum? annual Has the member used any of these services to date? no Is there a time limit (weeks/months) on start of program and/or program completion? No   2ndary insurance is active and benefits verified through [place]. Co-pay 0, DED 0/0 met, out of pocket 0/0 met, co-insurance 0%. No pre-authorization required.

## 2021-10-14 ENCOUNTER — Telehealth (HOSPITAL_COMMUNITY): Payer: Self-pay | Admitting: *Deleted

## 2021-10-14 ENCOUNTER — Telehealth: Payer: Self-pay | Admitting: Pharmacist

## 2021-10-14 NOTE — Telephone Encounter (Signed)
-----   Message from Larey Dresser, MD sent at 10/14/2021  2:46 PM EDT ----- Regarding: RE: oxygen use at cardiac rehab. yes ----- Message ----- From: Magda Kiel, RN Sent: 10/14/2021   2:45 PM EDT To: Larey Dresser, MD Subject: oxygen use at cardiac rehab.                   Good afternoon Dr [name],  Parrish is coming to orientation for cardiac rehab tomorrow. May we use 2l/min of oxygen to keep her oxygen saturations greater than 90% if needed for exercise?  Thanks for your assistance,  Sincerely, Barnet Pall RN Cardiac Rehab

## 2021-10-14 NOTE — Telephone Encounter (Signed)
Patient called to say she has finished the 4 week of Ozempic 0.'25mg'$  and should she go up to 0.'5mg'$ . Felt nauseous/little diarrhea for 2 days for the first 2 weeks but now feels good. Advised to increase to 0.'5mg'$  for the next 6 weeks. Patient will call again when she finishes the 0.'5mg'$ .

## 2021-10-15 ENCOUNTER — Telehealth (HOSPITAL_COMMUNITY): Payer: Self-pay | Admitting: Physician Assistant

## 2021-10-15 ENCOUNTER — Encounter (HOSPITAL_COMMUNITY)
Admission: RE | Admit: 2021-10-15 | Discharge: 2021-10-15 | Disposition: A | Discharge status: Home / Self Care | Payer: Medicare Other | Source: Ambulatory Visit | Attending: Cardiology | Admitting: Cardiology

## 2021-10-15 VITALS — BP 149/80 | HR 60 | Ht 61.75 in | Wt 230.9 lb

## 2021-10-15 DIAGNOSIS — Z48812 Encounter for surgical aftercare following surgery on the circulatory system: Secondary | ICD-10-CM | POA: Diagnosis not present

## 2021-10-15 DIAGNOSIS — Z9861 Coronary angioplasty status: Secondary | ICD-10-CM | POA: Insufficient documentation

## 2021-10-15 NOTE — Progress Notes (Signed)
Cardiac Rehab Medication Review by a Nurse  Does the patient  feel that his/her medications are working for him/her?  YES   Has the patient been experiencing any side effects to the medications prescribed?  YES  Does the patient measure his/her own blood pressure or blood glucose at home?  YES  Does the patient have any problems obtaining medications due to transportation or finances?   NO  Understanding of regimen: good Understanding of indications: good Potential of compliance: good    Nurse comments: Skylarr is taking her medications as prescribed. Lamara is increasing her ozempic dose to 0.5 mg once a week. Octavia says she sometimes experiences nausea and diarrhea from taking ozempic this usually goes away in a few weeks. Aradhana checks her blood pressure twice a day and her CBG 3 times a day.   Christa See Bonnye Halle RN 10/15/2021 3:26 PM

## 2021-10-15 NOTE — Progress Notes (Addendum)
Patient here for cardiac rehab orientation completed 6 minute nustep test. Did not preform 6 minute walk test as the patient is deconditioned. Blood pressure 149/80. Oxygen saturation 95% on room air. [name] [name] stopped at 3:00-3:49 due to fatigue in which she said she had chest pressure rated a 2/10. Blood pressure 170/74 heart rate 58. Sinus brady with a bundle branch block noted. Chest pressure resolved after rest. Heart rate ranged from 48-58. [name] [name] [name] [name] paged and notified about symptoms. [name] [name] talked with [name] [name] over the phone.Faxed exercise flow sheets to Dr. Claris Gladden office for review.Harrell Gave RN BSN

## 2021-10-15 NOTE — Telephone Encounter (Addendum)
Patient had episode of chest pain during cardiac rehab today. I spoke with her over the phone at that time. She was exercising on Nustep. She was hypertensive. Pain resolved with rest. Reports pain is similar to what she has been experiencing on a chronic basis. No relief with PCI earlier this year.  Home blood pressures reviewed. Typically 120s-130s/70s in am, has some systolic readings recently up to 829F systolic in evening.  Will add spiro 25 mg daily. Need BMET 1 week after starting.  I called the patient this evening and left a message for her to call back to review recommendations.

## 2021-10-15 NOTE — Progress Notes (Addendum)
Cardiac Individual Treatment Plan  Patient Details  Name: [name] [name] [name]: [id number] Date of Birth: Jun 09, 1945 Referring Provider:   Flowsheet Row INTENSIVE CARDIAC REHAB ORIENT from 10/15/2021 in [place]  Referring Provider Loralie Champagne, MD       Initial Encounter Date:  [place] from 10/15/2021 in [place]  Date 10/15/21       Visit Diagnosis: 04/20/21 S/P PTCA p RCA for ISR  Patient's Home Medications on Admission:  Current Outpatient Medications:    acetaminophen (TYLENOL) 500 MG tablet, Take 500 mg by mouth every 6 (six) hours as needed for mild pain, moderate pain, fever or headache., Disp: , Rfl:    albuterol (VENTOLIN HFA) 108 (90 Base) MCG/ACT inhaler, Inhale 2 puffs into the lungs every 4 (four) hours as needed for wheezing or shortness of breath. , Disp: , Rfl:    Alirocumab (PRALUENT) 150 MG/ML SOAJ, Inject 150 mg into the skin every 14 (fourteen) days., Disp: 6 mL, Rfl: 3   ALPRAZolam (XANAX) 0.5 MG tablet, Take 1-2 tablets (0.5-1 mg total) by mouth See admin instructions. Take one tablet (0.5 mg) by mouth every morning and two tablets (1 mg) at night, may also take 1 tablet (0.5 mg) midday as needed for anxiety, Disp: , Rfl:    amLODipine (NORVASC) 10 MG tablet, Take 10 mg by mouth at bedtime., Disp: , Rfl:    apixaban (ELIQUIS) 5 MG TABS tablet, Take 1 tablet (5 mg total) by mouth 2 (two) times daily., Disp: 180 tablet, Rfl: 1   B Complex-C (B-COMPLEX WITH VITAMIN C) tablet, Take 1 tablet by mouth in the morning., Disp: , Rfl:    bisacodyl (DULCOLAX) 5 MG EC tablet, Take 5 mg by mouth daily as needed for moderate constipation., Disp: , Rfl:    buPROPion (WELLBUTRIN XL) 150 MG 24 hr tablet, Take 150 mg by mouth every morning., Disp: , Rfl:    carboxymethylcellulose (REFRESH PLUS) 0.5 % SOLN, 1 drop in the morning, at noon, and at bedtime., Disp: , Rfl:     cholecalciferol (VITAMIN D3) 25 MCG (1000 UNIT) tablet, Take 1,000 Units by mouth in the morning., Disp: , Rfl:    clopidogrel (PLAVIX) 75 MG tablet, Take 75 mg by mouth every morning., Disp: , Rfl:    denosumab (PROLIA) 60 MG/ML SOSY injection, Inject 60 mg into the skin every 6 (six) months., Disp: , Rfl:    DULoxetine (CYMBALTA) 30 MG capsule, Take 1 capsule (30 mg total) by mouth daily., Disp: 30 capsule, Rfl: 0   ezetimibe (ZETIA) 10 MG tablet, Take 1 tablet (10 mg total) by mouth at bedtime., Disp: 90 tablet, Rfl: 3   fluocinonide cream (LIDEX) 0.93 %, Apply 1 application. topically 2 (two) times daily as needed (irritation)., Disp: , Rfl:    Fluticasone-Salmeterol (ADVAIR) 250-50 MCG/DOSE AEPB, Inhale 1 puff into the lungs daily., Disp: , Rfl:    gabapentin (NEURONTIN) 600 MG tablet, Take 600-1,200 mg by mouth See admin instructions. Take 1 tablet (600 mg) by mouth in the morning & take 2 tablets  (1200 mg) by mouth at bedtime, Disp: , Rfl:    Insulin Glargine (LANTUS SOLOSTAR) 100 UNIT/ML Solostar Pen, Inject 28-44 Units into the skin at bedtime. Per sliding scale, Disp: , Rfl:    insulin lispro (HUMALOG) 100 UNIT/ML KwikPen, Inject 14-20 Units into the skin See admin instructions. Inject 14 units subcutaneously prior to breakfast and supper; add  4 units for CBG >200, Disp: , Rfl:    ipratropium-albuterol (DUONEB) 0.5-2.5 (3) MG/3ML SOLN, Take 3 mLs by nebulization 2 (two) times daily as needed (asthma)., Disp: , Rfl:    loperamide (IMODIUM A-D) 2 MG tablet, Take 2 mg by mouth 4 (four) times daily as needed for diarrhea or loose stools., Disp: , Rfl:    methocarbamol (ROBAXIN) 500 MG tablet, Take 500 mg by mouth See admin instructions. Take 1 tablet (500 mg) by mouth scheduled at bedtime & may take 2 additional doses during the day if needed for back pain., Disp: , Rfl:    Multiple Vitamin (MULTIVITAMIN WITH MINERALS) TABS tablet, Take 1 tablet by mouth every morning. Centrum - Women over 27,  Disp: , Rfl:    Multiple Vitamins-Minerals (Ravalli) CAPS, Take 1 capsule by mouth every morning., Disp: , Rfl:    nitroGLYCERIN (NITROSTAT) 0.3 MG SL tablet, Place 1 tablet (0.3 mg total) under the tongue every 5 (five) minutes x 3 doses as needed for chest pain., Disp: 25 tablet, Rfl: 1   ondansetron (ZOFRAN-ODT) 4 MG disintegrating tablet, Take 4 mg by mouth 2 (two) times daily as needed for nausea or vomiting., Disp: , Rfl:    pantoprazole (PROTONIX) 40 MG tablet, Take 1 tablet (40 mg total) by mouth daily., Disp: 30 tablet, Rfl: 0   potassium chloride SA (KLOR-CON) 20 MEQ tablet, Take 1 tablet (20 mEq total) by mouth daily., Disp: 90 tablet, Rfl: 3   Pyridoxine HCl (VITAMIN B-6 PO), Take 1 tablet by mouth every morning., Disp: , Rfl:    ranolazine (RANEXA) 1000 MG SR tablet, TAKE ONE TABLET BY MOUTH TWICE DAILY, Disp: 180 tablet, Rfl: 3   rosuvastatin (CRESTOR) 40 MG tablet, Take 1 tablet (40 mg total) by mouth daily. (Patient taking differently: Take 40 mg by mouth at bedtime.), Disp: 90 tablet, Rfl: 3   sacubitril-valsartan (ENTRESTO) 97-103 MG, Take 1 tablet by mouth 2 (two) times daily., Disp: 180 tablet, Rfl: 3   Semaglutide,0.25 or 0.'5MG'$ /DOS, 2 MG/3ML SOPN, Inject 0.'25mg'$  SQ once weekly x 4 weeks, then increase to 0.'5mg'$  SQ once weekly (Patient taking differently: every Wednesday. Inject 0.'25mg'$  SQ once weekly x 4 weeks, then increase to 0.'5mg'$  SQ once weekly), Disp: 3 mL, Rfl: 1   torsemide (DEMADEX) 20 MG tablet, Take 0.5 tablets (10 mg total) by mouth daily., Disp: 15 tablet, Rfl: 2   traZODone (DESYREL) 50 MG tablet, Take 1 tablet (50 mg total) by mouth at bedtime., Disp: 90 tablet, Rfl: 1   vitamin B-12 (CYANOCOBALAMIN) 1000 MCG tablet, Take 1,000 mcg by mouth every morning. , Disp: , Rfl:    zolpidem (AMBIEN) 10 MG tablet, Take 10 mg by mouth at bedtime., Disp: , Rfl:    OXYGEN, Inhale 2 L into the lungs at bedtime as needed (shortness of breath). , Disp: , Rfl:    Past Medical History: Past Medical History:  Diagnosis Date   Anemia    hx   Anxiety    Asthma    Basal cell carcinoma 05/2014   "left shoulder"   Bundle branch block, left    chronic/notes 07/18/2013   CHF (congestive heart failure) (HCC)    Chronic insomnia 05/06/2015   Chronic kidney disease    frequency, sees dr Jamal Maes every 4 to 6 months (01/16/2018)   Chronic lower back pain    Claustrophobia    Common migraine 05/14/2014   Coronary artery disease    MI in 2001,  2002, 2006, 2011, 2014   Depression    Diabetic peripheral neuropathy ([place]) 01/12/2019   GERD (gastroesophageal reflux disease)    H/O hiatal hernia    Headache    "at least 2/month" (01/16/2018)   Heart murmur    Hyperlipidemia    Hypertension    Memory change 05/14/2014   Migraine    "5-6/year"  (01/16/2018)   Obesity 01-2010   Obstructive sleep apnea    "can't wear machine; I have claustrophobia" (01/16/2018), states she had a 2nd sleep study and does not have sleep apnea, her O2 decreases and now is on 2 L of O2 at night.   On home oxygen therapy    "2L at night and prn during daytime" (01/16/2018)   Osteoarthritis    "knees and hands" (01/16/2018)   Peripheral vascular disease (HCC)    ? numbness, tingling arms and legs   PONV (postoperative nausea and vomiting)    Stroke ([place])    2014, 2015, 2016   Type II diabetes mellitus ([place])    Ventral hernia    hx of    Tobacco Use: Social History   Tobacco Use  Smoking Status Never  Smokeless Tobacco Never    Labs: Review Flowsheet  More data exists      Latest Ref Rng & Units 01/17/2019 06/06/2019 05/13/2020 03/31/2021 04/20/2021  Labs for ITP Cardiac and Pulmonary Rehab  Cholestrol 0 - 200 mg/dL - 131  121  173  -  LDL (calc) 0 - 99 mg/dL - 57  58  102  -  HDL-C >40 mg/dL - 57  51  58  -  Trlycerides <150 mg/dL - 83  58  65  -  Hemoglobin A1c 4.8 - 5.6 % 7.2  - - - -  Bicarbonate 20.0 - 28.0 mmol/L 20.0 - 28.0 mmol/L - - - - 27.2   27.5   TCO2 22 - 32 mmol/L 22 - 32 mmol/L - - - - 29  29   O2 Saturation % % - - - - 79  80     Capillary Blood Glucose: Lab Results  Component Value Date   GLUCAP 196 (H) 06/02/2021   GLUCAP 133 (H) 06/02/2021   GLUCAP 114 (H) 06/02/2021   GLUCAP 117 (H) 06/02/2021   GLUCAP 171 (H) 06/02/2021     Exercise Target Goals: Exercise Program Goal: Individual exercise prescription set using results from initial 6 min walk test and THRR while considering  patient's activity barriers and safety.   Exercise Prescription Goal: Initial exercise prescription builds to 30-45 minutes a day of aerobic activity, 2-3 days per week.  Home exercise guidelines will be given to patient during program as part of exercise prescription that the participant will acknowledge.  Activity Barriers & Risk Stratification:  Activity Barriers & Cardiac Risk Stratification - 10/15/21 1439       Activity Barriers & Cardiac Risk Stratification   Activity Barriers Arthritis;Back Problems;Neck/Spine Problems;Joint Problems;Deconditioning;Muscular Weakness;Shortness of Breath;Decreased Ventricular Function;Chest Pain/Angina;Balance Concerns;History of Falls;Assistive Device    Cardiac Risk Stratification High             6 Minute Walk:  6 Minute Walk     Row Name 10/15/21 1413         6 Minute Walk   Phase Initial  Perfomed on a Nustep     Distance 1115 feet     Walk Time 6 minutes     # of Rest Breaks 1  From 3:00 to 3:49  MPH 2.11     METS 1.3     RPE 11     Perceived Dyspnea  2     VO2 Peak 4.61     Symptoms Yes (comment)     Comments left knee pain 2/10, low back ppain 4/10, angina(chest pressure) 2/10     Resting HR 58 bpm     Resting BP 149/80     Resting Oxygen Saturation  96 %     Exercise Oxygen Saturation  during 6 min walk 95 %     Max Ex. HR 57 bpm     Max Ex. BP 170/74     2 Minute Post BP 154/80  Exit BP was 118/80       Interval Oxygen   Interval Oxygen? Yes      Baseline Oxygen Saturation % 96 %     1 Minute Oxygen Saturation % 96 %     1 Minute Liters of Oxygen 0 L     2 Minute Oxygen Saturation % 97 %     2 Minute Liters of Oxygen 0 L     3 Minute Oxygen Saturation % 96 %     3 Minute Liters of Oxygen 0 L     4 Minute Oxygen Saturation % 98 %     4 Minute Liters of Oxygen 0 L     5 Minute Oxygen Saturation % 97 %     5 Minute Liters of Oxygen 0 L     6 Minute Oxygen Saturation % 97 %     6 Minute Liters of Oxygen 0 L     2 Minute Post Oxygen Saturation % 96 %     2 Minute Post Liters of Oxygen 0 L              Oxygen Initial Assessment:   Oxygen Re-Evaluation:   Oxygen Discharge (Final Oxygen Re-Evaluation):   Initial Exercise Prescription:  Initial Exercise Prescription - 10/15/21 1400       Date of Initial Exercise RX and Referring Provider   Date 10/15/21    Referring Provider Loralie Champagne, MD    Expected Discharge Date 12/11/21      NuStep   Level 1    SPM 70    Minutes 20    METs 1.3      Prescription Details   Frequency (times per week) 2    Duration Progress to 30 minutes of continuous aerobic without signs/symptoms of physical distress      Intensity   THRR 40-80% of Max Heartrate 59-118    Ratings of Perceived Exertion 11-13    Perceived Dyspnea 0-4      Progression   Progression Continue progressive overload as per policy without signs/symptoms or physical distress.      Resistance Training   Training Prescription Yes    Weight 1 lb    Reps 10-15             Perform Capillary Blood Glucose checks as needed.  Exercise Prescription Changes:   Exercise Comments:   Exercise Goals and Review:   Exercise Goals     Row Name 10/15/21 1434             Exercise Goals   Increase Physical Activity Yes       Intervention Provide advice, education, support and counseling about physical activity/exercise needs.;Develop an individualized exercise prescription for aerobic and resistive  training based on initial evaluation findings, risk stratification, comorbidities  and participant's personal goals.       Expected Outcomes Short Term: Attend rehab on a regular basis to increase amount of physical activity.;Long Term: Add in home exercise to make exercise part of routine and to increase amount of physical activity.;Long Term: Exercising regularly at least 3-5 days a week.       Increase Strength and Stamina Yes       Intervention Develop an individualized exercise prescription for aerobic and resistive training based on initial evaluation findings, risk stratification, comorbidities and participant's personal goals.;Provide advice, education, support and counseling about physical activity/exercise needs.       Expected Outcomes Short Term: Increase workloads from initial exercise prescription for resistance, speed, and METs.;Short Term: Perform resistance training exercises routinely during rehab and add in resistance training at home;Long Term: Improve cardiorespiratory fitness, muscular endurance and strength as measured by increased METs and functional capacity (6MWT)       Able to understand and use rate of perceived exertion (RPE) scale Yes       Intervention Provide education and explanation on how to use RPE scale       Expected Outcomes Short Term: Able to use RPE daily in rehab to express subjective intensity level;Long Term:  Able to use RPE to guide intensity level when exercising independently       Knowledge and understanding of Target Heart Rate Range (THRR) Yes       Intervention Provide education and explanation of THRR including how the numbers were predicted and where they are located for reference       Expected Outcomes Short Term: Able to state/look up THRR;Short Term: Able to use daily as guideline for intensity in rehab;Long Term: Able to use THRR to govern intensity when exercising independently       Understanding of Exercise Prescription Yes       Intervention  Provide education, explanation, and written materials on patient's individual exercise prescription       Expected Outcomes Short Term: Able to explain program exercise prescription;Long Term: Able to explain home exercise prescription to exercise independently                Exercise Goals Re-Evaluation :   Discharge Exercise Prescription (Final Exercise Prescription Changes):   Nutrition:  Target Goals: Understanding of nutrition guidelines, daily intake of sodium '1500mg'$ , cholesterol '200mg'$ , calories 30% from fat and 7% or less from saturated fats, daily to have 5 or more servings of fruits and vegetables.  Biometrics:  Pre Biometrics - 10/15/21 1105       Pre Biometrics   Waist Circumference 46 inches    Hip Circumference 58.5 inches    Waist to Hip Ratio 0.79 %    Triceps Skinfold 45 mm    % Body Fat 54.5 %    Grip Strength 18 kg    Flexibility --   Not performed due to low back poroblems   Single Leg Stand --   Uses walker, h/o of falls; not performed             Nutrition Therapy Plan and Nutrition Goals:   Nutrition Assessments:  MEDIFICTS Score Key: ?70 Need to make dietary changes  40-70 Heart Healthy Diet ? 40 Therapeutic Level Cholesterol Diet    Picture Your Plate Scores: <46 Unhealthy dietary pattern with much room for improvement. 41-50 Dietary pattern unlikely to meet recommendations for good health and room for improvement. 51-60 More healthful dietary pattern, with some room for improvement.  >  60 Healthy dietary pattern, although there may be some specific behaviors that could be improved.    Nutrition Goals Re-Evaluation:   Nutrition Goals Re-Evaluation:   Nutrition Goals Discharge (Final Nutrition Goals Re-Evaluation):   Psychosocial: Target Goals: Acknowledge presence or absence of significant depression and/or stress, maximize coping skills, provide positive support system. Participant is able to verbalize types and ability to  use techniques and skills needed for reducing stress and depression.  Initial Review & Psychosocial Screening:  Initial Psych Review & Screening - 10/15/21 1531       Initial Review   Current issues with Current Stress Concerns;History of Depression    Source of Stress Concerns Chronic Illness;Unable to participate in former interests or hobbies;Unable to perform yard/household activities;Family    Comments History of Depression, Mother is under hospice care. Daughter has lupus.      Family Dynamics   Good Support System? Yes   Lorri has her huband, daughter and sister for support.     Barriers   Psychosocial barriers to participate in program The patient should benefit from training in stress management and relaxation.      Screening Interventions   Interventions Encouraged to exercise;To provide support and resources with identified psychosocial needs;Provide feedback about the scores to participant    Expected Outcomes Long Term Goal: Stressors or current issues are controlled or eliminated.;Short Term goal: Identification and review with participant of any Quality of Life or Depression concerns found by scoring the questionnaire.;Long Term goal: The participant improves quality of Life and PHQ9 Scores as seen by post scores and/or verbalization of changes             Quality of Life Scores:  Quality of Life - 10/15/21 1408       Quality of Life   Select Quality of Life      Quality of Life Scores   Health/Function Pre 7.2 %    Socioeconomic Pre 24.79 %    Psych/Spiritual Pre 9.86 %    Family Pre 26.4 %    GLOBAL Pre 14.19 %            Scores of 19 and below usually indicate a poorer quality of life in these areas.  A difference of  2-3 points is a clinically meaningful difference.  A difference of 2-3 points in the total score of the Quality of Life Index has been associated with significant improvement in overall quality of life, self-image, physical symptoms, and  general health in studies assessing change in quality of life.  PHQ-9: Review Flowsheet  More data exists      10/15/2021 10/12/2018 10/28/2016 09/29/2016 06/23/2016  Depression screen PHQ 2/9  Decreased Interest 1 2 0 0 0  Down, Depressed, Hopeless 0 1 - 0 1  PHQ - 2 Score 1 3 0 0 1  Altered sleeping - 1 - - -  Tired, decreased energy - 1 - - -  Change in appetite - 0 - - -  Feeling bad or failure about yourself  - 1 - - -  Trouble concentrating - 1 - - -  Moving slowly or fidgety/restless - 0 - - -  Suicidal thoughts - 0 - - -  PHQ-9 Score - 7 - - -  Difficult doing work/chores - Somewhat difficult - - -   Interpretation of Total Score  Total Score Depression Severity:  1-4 = Minimal depression, 5-9 = Mild depression, 10-14 = Moderate depression, 15-19 = Moderately severe depression, 20-27 =  Severe depression   Psychosocial Evaluation and Intervention:   Psychosocial Re-Evaluation:   Psychosocial Discharge (Final Psychosocial Re-Evaluation):   Vocational Rehabilitation: Provide vocational rehab assistance to qualifying candidates.   Vocational Rehab Evaluation & Intervention:  Vocational Rehab - 10/15/21 1537       Initial Vocational Rehab Evaluation & Intervention   Assessment shows need for Vocational Rehabilitation No   Zuleyka is retired and does not need vocational rehab at this time            Education: Education Goals: Education classes will be provided on a weekly basis, covering required topics. Participant will state understanding/return demonstration of topics presented.     Core Videos: Exercise    Move It!  Clinical staff conducted group or individual video education with verbal and written material and guidebook.  Patient learns the recommended Pritikin exercise program. Exercise with the goal of living a long, healthy life. Some of the health benefits of exercise include controlled diabetes, healthier blood pressure levels, improved cholesterol  levels, improved heart and lung capacity, improved sleep, and better body composition. Everyone should speak with their doctor before starting or changing an exercise routine.  Biomechanical Limitations Clinical staff conducted group or individual video education with verbal and written material and guidebook.  Patient learns how biomechanical limitations can impact exercise and how we can mitigate and possibly overcome limitations to have an impactful and balanced exercise routine.  Body Composition Clinical staff conducted group or individual video education with verbal and written material and guidebook.  Patient learns that body composition (ratio of muscle mass to fat mass) is a key component to assessing overall fitness, rather than body weight alone. Increased fat mass, especially visceral belly fat, can put [place] at increased risk for metabolic syndrome, type 2 diabetes, heart disease, and even death. It is recommended to combine diet and exercise (cardiovascular and resistance training) to improve your body composition. Seek guidance from your physician and exercise physiologist before implementing an exercise routine.  Exercise Action Plan Clinical staff conducted group or individual video education with verbal and written material and guidebook.  Patient learns the recommended strategies to achieve and enjoy long-term exercise adherence, including variety, self-motivation, self-efficacy, and positive decision making. Benefits of exercise include fitness, good health, weight management, more energy, better sleep, less stress, and overall well-being.  Medical   Heart Disease Risk Reduction Clinical staff conducted group or individual video education with verbal and written material and guidebook.  Patient learns our heart is our most vital organ as it circulates oxygen, nutrients, white blood cells, and hormones throughout the entire body, and carries waste away. Data supports a plant-based  eating plan like the Pritikin Program for its effectiveness in slowing progression of and reversing heart disease. The video provides a number of recommendations to address heart disease.   Metabolic Syndrome and Belly Fat  Clinical staff conducted group or individual video education with verbal and written material and guidebook.  Patient learns what metabolic syndrome is, how it leads to heart disease, and how one can reverse it and keep it from coming back. You have metabolic syndrome if you have 3 of the following 5 criteria: abdominal obesity, high blood pressure, high triglycerides, low HDL cholesterol, and high blood sugar.  Hypertension and Heart Disease Clinical staff conducted group or individual video education with verbal and written material and guidebook.  Patient learns that high blood pressure, or hypertension, is very common in the [place]. Hypertension is largely due to  excessive salt intake, but other important risk factors include being overweight, physical inactivity, drinking too much alcohol, smoking, and not eating enough potassium from fruits and vegetables. High blood pressure is a leading risk factor for heart attack, stroke, congestive heart failure, dementia, kidney failure, and premature death. Long-term effects of excessive salt intake include stiffening of the arteries and thickening of heart muscle and organ damage. Recommendations include ways to reduce hypertension and the risk of heart disease.  Diseases of Our Time - Focusing on Diabetes Clinical staff conducted group or individual video education with verbal and written material and guidebook.  Patient learns why the best way to stop diseases of our time is prevention, through food and other lifestyle changes. Medicine (such as prescription pills and surgeries) is often only a Band-Aid on the problem, not a long-term solution. Most common diseases of our time include obesity, type 2 diabetes, hypertension,  heart disease, and cancer. The Pritikin Program is recommended and has been proven to help reduce, reverse, and/or prevent the damaging effects of metabolic syndrome.  Nutrition   Overview of the Pritikin Eating Plan  Clinical staff conducted group or individual video education with verbal and written material and guidebook.  Patient learns about the [place] for disease risk reduction. The [place] emphasizes a wide variety of unrefined, minimally-processed carbohydrates, like fruits, vegetables, whole grains, and legumes. Go, Caution, and Stop food choices are explained. Plant-based and lean animal proteins are emphasized. Rationale provided for low sodium intake for blood pressure control, low added sugars for blood sugar stabilization, and low added fats and oils for coronary artery disease risk reduction and weight management.  Calorie Density  Clinical staff conducted group or individual video education with verbal and written material and guidebook.  Patient learns about calorie density and how it impacts the Pritikin Eating Plan. Knowing the characteristics of the food you choose will help you decide whether those foods will lead to weight gain or weight loss, and whether you want to consume more or less of them. Weight loss is usually a side effect of the Pritikin Eating Plan because of its focus on low calorie-dense foods.  Label Reading  Clinical staff conducted group or individual video education with verbal and written material and guidebook.  Patient learns about the Pritikin recommended label reading guidelines and corresponding recommendations regarding calorie density, added sugars, sodium content, and whole grains.  Dining Out - Part 1  Clinical staff conducted group or individual video education with verbal and written material and guidebook.  Patient learns that restaurant meals can be sabotaging because they can be so high in calories, fat, sodium, and/or  sugar. Patient learns recommended strategies on how to positively address this and avoid unhealthy pitfalls.  Facts on Fats  Clinical staff conducted group or individual video education with verbal and written material and guidebook.  Patient learns that lifestyle modifications can be just as effective, if not more so, as many medications for lowering your risk of heart disease. A Pritikin lifestyle can help to reduce your risk of inflammation and atherosclerosis (cholesterol build-up, or plaque, in the artery walls). Lifestyle interventions such as dietary choices and physical activity address the cause of atherosclerosis. A review of the types of fats and their impact on blood cholesterol levels, along with dietary recommendations to reduce fat intake is also included.  Nutrition Action Plan  Clinical staff conducted group or individual video education with verbal and written material and guidebook.  Patient learns  how to incorporate Pritikin recommendations into their lifestyle. Recommendations include planning and keeping personal health goals in mind as an important part of their success.  Healthy Mind-Set    Healthy Minds, Bodies, Hearts  Clinical staff conducted group or individual video education with verbal and written material and guidebook.  Patient learns how to identify when they are stressed. Video will discuss the impact of that stress, as well as the many benefits of stress management. Patient will also be introduced to stress management techniques. The way we think, act, and feel has an impact on our hearts.  How Our Thoughts Can Heal Our Hearts  Clinical staff conducted group or individual video education with verbal and written material and guidebook.  Patient learns that negative thoughts can cause depression and anxiety. This can result in negative lifestyle behavior and serious health problems. Cognitive behavioral therapy is an effective method to help control our thoughts in  order to change and improve our emotional outlook.  Additional Videos:  Exercise    Improving Performance  Clinical staff conducted group or individual video education with verbal and written material and guidebook.  Patient learns to use a non-linear approach by alternating intensity levels and lengths of time spent exercising to help burn more calories and lose more body fat. Cardiovascular exercise helps improve heart health, metabolism, hormonal balance, blood sugar control, and recovery from fatigue. Resistance training improves strength, endurance, balance, coordination, reaction time, metabolism, and muscle mass. Flexibility exercise improves circulation, posture, and balance. Seek guidance from your physician and exercise physiologist before implementing an exercise routine and learn your capabilities and proper form for all exercise.  Introduction to Yoga  Clinical staff conducted group or individual video education with verbal and written material and guidebook.  Patient learns about yoga, a discipline of the coming together of mind, breath, and body. The benefits of yoga include improved flexibility, improved range of motion, better posture and core strength, increased lung function, weight loss, and positive self-image. Yoga's heart health benefits include lowered blood pressure, healthier heart rate, decreased cholesterol and triglyceride levels, improved immune function, and reduced stress. Seek guidance from your physician and exercise physiologist before implementing an exercise routine and learn your capabilities and proper form for all exercise.  Medical   Aging: Enhancing Your Quality of Life  Clinical staff conducted group or individual video education with verbal and written material and guidebook.  Patient learns key strategies and recommendations to stay in good physical health and enhance quality of life, such as prevention strategies, having an advocate, securing a [place], and keeping a list of medications and system for tracking them. It also discusses how to avoid risk for bone loss.  Biology of Weight Control  Clinical staff conducted group or individual video education with verbal and written material and guidebook.  Patient learns that weight gain occurs because we consume more calories than we burn (eating more, moving less). Even if your body weight is normal, you may have higher ratios of fat compared to muscle mass. Too much body fat puts you at increased risk for cardiovascular disease, heart attack, stroke, type 2 diabetes, and obesity-related cancers. In addition to exercise, following the [place] can help reduce your risk.  Decoding Lab Results  Clinical staff conducted group or individual video education with verbal and written material and guidebook.  Patient learns that lab test reflects one measurement whose values change over time and are influenced by many  factors, including medication, stress, sleep, exercise, food, hydration, pre-existing medical conditions, and more. It is recommended to use the knowledge from this video to become more involved with your lab results and evaluate your numbers to speak with your doctor.   Diseases of Our Time - Overview  Clinical staff conducted group or individual video education with verbal and written material and guidebook.  Patient learns that according to the CDC, 50% to 70% of chronic diseases (such as obesity, type 2 diabetes, elevated lipids, hypertension, and heart disease) are avoidable through lifestyle improvements including healthier food choices, listening to satiety cues, and increased physical activity.  Sleep Disorders Clinical staff conducted group or individual video education with verbal and written material and guidebook.  Patient learns how good quality and duration of sleep are important to overall health and well-being. Patient also learns  about sleep disorders and how they impact health along with recommendations to address them, including discussing with a physician.  Nutrition  Dining Out - Part 2 Clinical staff conducted group or individual video education with verbal and written material and guidebook.  Patient learns how to plan ahead and communicate in order to maximize their dining experience in a healthy and nutritious manner. Included are recommended food choices based on the type of restaurant the patient is visiting.   Fueling a Best boy conducted group or individual video education with verbal and written material and guidebook.  There is a strong connection between our food choices and our health. Diseases like obesity and type 2 diabetes are very prevalent and are in large-part due to lifestyle choices. The Pritikin Eating Plan provides plenty of food and hunger-curbing satisfaction. It is easy to follow, affordable, and helps reduce health risks.  Menu Workshop  Clinical staff conducted group or individual video education with verbal and written material and guidebook.  Patient learns that restaurant meals can sabotage health goals because they are often packed with calories, fat, sodium, and sugar. Recommendations include strategies to plan ahead and to communicate with the manager, chef, or server to help order a healthier meal.  Planning Your Eating Strategy  Clinical staff conducted group or individual video education with verbal and written material and guidebook.  Patient learns about the [place] and its benefit of reducing the risk of disease. The [place] does not focus on calories. Instead, it emphasizes high-quality, nutrient-rich foods. By knowing the characteristics of the foods, we choose, we can determine their calorie density and make informed decisions.  Targeting Your Nutrition Priorities  Clinical staff conducted group or individual video education with  verbal and written material and guidebook.  Patient learns that lifestyle habits have a tremendous impact on disease risk and progression. This video provides eating and physical activity recommendations based on your personal health goals, such as reducing LDL cholesterol, losing weight, preventing or controlling type 2 diabetes, and reducing high blood pressure.  Vitamins and Minerals  Clinical staff conducted group or individual video education with verbal and written material and guidebook.  Patient learns different ways to obtain key vitamins and minerals, including through a recommended healthy diet. It is important to discuss all supplements you take with your doctor.   Healthy Mind-Set    Smoking Cessation  Clinical staff conducted group or individual video education with verbal and written material and guidebook.  Patient learns that cigarette smoking and tobacco addiction pose a serious health risk which affects millions of people. Stopping smoking will significantly reduce  the risk of heart disease, lung disease, and many forms of cancer. Recommended strategies for quitting are covered, including working with your doctor to develop a successful plan.  Culinary   Becoming a Financial trader conducted group or individual video education with verbal and written material and guidebook.  Patient learns that cooking at home can be healthy, cost-effective, quick, and puts them in control. Keys to cooking healthy recipes will include looking at your recipe, assessing your equipment needs, planning ahead, making it simple, choosing cost-effective seasonal ingredients, and limiting the use of added fats, salts, and sugars.  Cooking - Breakfast and Snacks  Clinical staff conducted group or individual video education with verbal and written material and guidebook.  Patient learns how important breakfast is to satiety and nutrition through the entire day. Recommendations include key  foods to eat during breakfast to help stabilize blood sugar levels and to prevent overeating at meals later in the day. Planning ahead is also a key component.  Cooking - Human resources officer conducted group or individual video education with verbal and written material and guidebook.  Patient learns eating strategies to improve overall health, including an approach to cook more at home. Recommendations include thinking of animal protein as a side on your plate rather than center stage and focusing instead on lower calorie dense options like vegetables, fruits, whole grains, and plant-based proteins, such as beans. Making sauces in large quantities to freeze for later and leaving the skin on your vegetables are also recommended to maximize your experience.  Cooking - Healthy Salads and Dressing Clinical staff conducted group or individual video education with verbal and written material and guidebook.  Patient learns that vegetables, fruits, whole grains, and legumes are the foundations of the [place]. Recommendations include how to incorporate each of these in flavorful and healthy salads, and how to create homemade salad dressings. Proper handling of ingredients is also covered. Cooking - Soups and [employer] - Soups and Desserts Clinical staff conducted group or individual video education with verbal and written material and guidebook.  Patient learns that Pritikin soups and desserts make for easy, nutritious, and delicious snacks and meal components that are low in sodium, fat, sugar, and calorie density, while high in vitamins, minerals, and filling fiber. Recommendations include simple and healthy ideas for soups and desserts.   Overview     The Pritikin Solution Program Overview Clinical staff conducted group or individual video education with verbal and written material and guidebook.  Patient learns that the results of the [place] Program have been  documented in more than 100 articles published in peer-reviewed journals, and the benefits include reducing risk factors for (and, in some cases, even reversing) high cholesterol, high blood pressure, type 2 diabetes, obesity, and more! An overview of the three key pillars of the Pritikin Program will be covered: eating well, doing regular exercise, and having a healthy mind-set.  WORKSHOPS  Exercise: Exercise Basics: Building Your Action Plan Clinical staff led group instruction and group discussion with PowerPoint presentation and patient guidebook. To enhance the learning environment the use of posters, models and videos may be added. At the conclusion of this workshop, patients will comprehend the difference between physical activity and exercise, as well as the benefits of incorporating both, into their routine. Patients will understand the FITT (Frequency, Intensity, Time, and Type) principle and how to use it to build an exercise action plan. In addition, safety concerns and  other considerations for exercise and cardiac rehab will be addressed by the presenter. The purpose of this lesson is to promote a comprehensive and effective weekly exercise routine in order to improve patients' overall level of fitness.   Managing Heart Disease: Your Path to a Healthier Heart Clinical staff led group instruction and group discussion with PowerPoint presentation and patient guidebook. To enhance the learning environment the use of posters, models and videos may be added.At the conclusion of this workshop, patients will understand the anatomy and physiology of the heart. Additionally, they will understand how Pritikin's three pillars impact the risk factors, the progression, and the management of heart disease.  The purpose of this lesson is to provide a high-level overview of the heart, heart disease, and how the Pritikin lifestyle positively impacts risk factors.  Exercise Biomechanics Clinical  staff led group instruction and group discussion with PowerPoint presentation and patient guidebook. To enhance the learning environment the use of posters, models and videos may be added. Patients will learn how the structural parts of their bodies function and how these functions impact their daily activities, movement, and exercise. Patients will learn how to promote a neutral spine, learn how to manage pain, and identify ways to improve their physical movement in order to promote healthy living. The purpose of this lesson is to expose patients to common physical limitations that impact physical activity. Participants will learn practical ways to adapt and manage aches and pains, and to minimize their effect on regular exercise. Patients will learn how to maintain good posture while sitting, walking, and lifting.  Balance Training and Fall Prevention  Clinical staff led group instruction and group discussion with PowerPoint presentation and patient guidebook. To enhance the learning environment the use of posters, models and videos may be added. At the conclusion of this workshop, patients will understand the importance of their sensorimotor skills (vision, proprioception, and the vestibular system) in maintaining their ability to balance as they age. Patients will apply a variety of balancing exercises that are appropriate for their current level of function. Patients will understand the common causes for poor balance, possible solutions to these problems, and ways to modify their physical environment in order to minimize their fall risk. The purpose of this lesson is to teach patients about the importance of maintaining balance as they age and ways to minimize their risk of falling.  WORKSHOPS   Nutrition:  Fueling a Scientist, research (physical sciences) led group instruction and group discussion with PowerPoint presentation and patient guidebook. To enhance the learning environment the use of  posters, models and videos may be added. Patients will review the foundational principles of the [place] and understand what constitutes a serving size in each of the food groups. Patients will also learn Pritikin-friendly foods that are better choices when away from home and review make-ahead meal and snack options. Calorie density will be reviewed and applied to three nutrition priorities: weight maintenance, weight loss, and weight gain. The purpose of this lesson is to reinforce (in a group setting) the key concepts around what patients are recommended to eat and how to apply these guidelines when away from home by planning and selecting Pritikin-friendly options. Patients will understand how calorie density may be adjusted for different weight management goals.  Mindful Eating  Clinical staff led group instruction and group discussion with PowerPoint presentation and patient guidebook. To enhance the learning environment the use of posters, models and videos may be added. Patients will briefly review  the concepts of the [place] and the importance of low-calorie dense foods. The concept of mindful eating will be introduced as well as the importance of paying attention to internal hunger signals. Triggers for non-hunger eating and techniques for dealing with triggers will be explored. The purpose of this lesson is to provide patients with the opportunity to review the basic principles of the [place], discuss the value of eating mindfully and how to measure internal cues of hunger and fullness using the Hunger Scale. Patients will also discuss reasons for non-hunger eating and learn strategies to use for controlling emotional eating.  Targeting Your Nutrition Priorities Clinical staff led group instruction and group discussion with PowerPoint presentation and patient guidebook. To enhance the learning environment the use of posters, models and videos may be added.  Patients will learn how to determine their genetic susceptibility to disease by reviewing their family history. Patients will gain insight into the importance of diet as part of an overall healthy lifestyle in mitigating the impact of genetics and other environmental insults. The purpose of this lesson is to provide patients with the opportunity to assess their personal nutrition priorities by looking at their family history, their own health history and current risk factors. Patients will also be able to discuss ways of prioritizing and modifying the [place] for their highest risk areas  Menu  Clinical staff led group instruction and group discussion with PowerPoint presentation and patient guidebook. To enhance the learning environment the use of posters, models and videos may be added. Using menus brought in from [employer], or printed from [employer], patients will apply the [place] dining out guidelines that were presented in the [employer] video. Patients will also be able to practice these guidelines in a variety of provided scenarios. The purpose of this lesson is to provide patients with the opportunity to practice hands-on learning of the [place] with actual menus and practice scenarios.  Label Reading Clinical staff led group instruction and group discussion with PowerPoint presentation and patient guidebook. To enhance the learning environment the use of posters, models and videos may be added. Patients will review and discuss the Pritikin label reading guidelines presented in Pritikin's Label Reading Educational series video. Using fool labels brought in from local grocery stores and markets, patients will apply the label reading guidelines and determine if the packaged food meet the Pritikin guidelines. The purpose of this lesson is to provide patients with the opportunity to review, discuss, and practice hands-on learning of the  Pritikin Label Reading guidelines with actual packaged food labels. [place] Workshops are designed to teach patients ways to prepare quick, simple, and affordable recipes at home. The importance of nutrition's role in chronic disease risk reduction is reflected in its emphasis in the overall Pritikin program. By learning how to prepare essential core Pritikin Eating Plan recipes, patients will increase control over what they eat; be able to customize the flavor of foods without the use of added salt, sugar, or fat; and improve the quality of the food they consume. By learning a set of core recipes which are easily assembled, quickly prepared, and affordable, patients are more likely to prepare more healthy foods at home. These workshops focus on convenient breakfasts, simple entres, side dishes, and desserts which can be prepared with minimal effort and are consistent with nutrition recommendations for cardiovascular risk reduction. [place] are taught by a Biomedical scientist  or registered dietitian (RD) who has been trained by the [employer] team. The chef or RD has a clear understanding of the importance of minimizing - if not completely eliminating - added fat, sugar, and sodium in recipes. Throughout the series of [place] Workshop sessions, patients will learn about healthy ingredients and efficient methods of cooking to build confidence in their capability to prepare    Cooking School weekly topics:  Adding Flavor- Sodium-Free  Fast and Healthy Breakfasts  Powerhouse Plant-Based Proteins  Satisfying Salads and Dressings  Simple Sides and Sauces  International Cuisine-Spotlight on the [place] Zones  Delicious Desserts  Savory Soups  Efficiency Cooking - Meals in a Snap  Tasty Appetizers and Snacks  Comforting Weekend Breakfasts  One-Pot Wonders   Fast Evening Meals  Easy [place] (Psychosocial): New Thoughts, New Behaviors Clinical staff led group instruction and group discussion with PowerPoint presentation and patient guidebook. To enhance the learning environment the use of posters, models and videos may be added. Patients will learn and practice techniques for developing effective health and lifestyle goals. Patients will be able to effectively apply the goal setting process learned to develop at least one new personal goal.  The purpose of this lesson is to expose patients to a new skill set of behavior modification techniques such as techniques setting SMART goals, overcoming barriers, and achieving new thoughts and new behaviors.  Managing Moods and Relationships Clinical staff led group instruction and group discussion with PowerPoint presentation and patient guidebook. To enhance the learning environment the use of posters, models and videos may be added. Patients will learn how emotional and chronic stress factors can impact their health and relationships. They will learn healthy ways to manage their moods and utilize positive coping mechanisms. In addition, ICR patients will learn ways to improve communication skills. The purpose of this lesson is to expose patients to ways of understanding how one's mood and health are intimately connected. Developing a healthy outlook can help build positive relationships and connections with others. Patients will understand the importance of utilizing effective communication skills that include actively listening and being heard. They will learn and understand the importance of the "4 Cs" and especially Connections in fostering of a Healthy Mind-Set.  Healthy Sleep for a Healthy Heart Clinical staff led group instruction and group discussion with PowerPoint presentation and patient guidebook. To enhance the learning environment the use of posters, models and videos may be added. At the conclusion of this workshop,  patients will be able to demonstrate knowledge of the importance of sleep to overall health, well-being, and quality of life. They will understand the symptoms of, and treatments for, common sleep disorders. Patients will also be able to identify daytime and nighttime behaviors which impact sleep, and they will be able to apply these tools to help manage sleep-related challenges. The purpose of this lesson is to provide patients with a general overview of sleep and outline the importance of quality sleep. Patients will learn about a few of the most common sleep disorders. Patients will also be introduced to the concept of "sleep hygiene," and discover ways to self-manage certain sleeping problems through simple daily behavior changes. Finally, the workshop will motivate patients by clarifying the links between quality sleep and their goals of heart-healthy living.   Recognizing and Reducing Stress Clinical staff led group instruction and group discussion with PowerPoint presentation and patient guidebook. To enhance the learning  environment the use of posters, models and videos may be added. At the conclusion of this workshop, patients will be able to understand the types of stress reactions, differentiate between acute and chronic stress, and recognize the impact that chronic stress has on their health. They will also be able to apply different coping mechanisms, such as reframing negative self-talk. Patients will have the opportunity to practice a variety of stress management techniques, such as deep abdominal breathing, progressive muscle relaxation, and/or guided imagery.  The purpose of this lesson is to educate patients on the role of stress in their lives and to provide healthy techniques for coping with it.  Learning Barriers/Preferences:  Learning Barriers/Preferences - 10/15/21 1412       Learning Barriers/Preferences   Learning Barriers Sight    Learning Preferences Skilled Demonstration              Education Topics:  Knowledge Questionnaire Score:  Knowledge Questionnaire Score - 10/15/21 1409       Knowledge Questionnaire Score   Pre Score 22/24             Core Components/Risk Factors/Patient Goals at Admission:  Personal Goals and Risk Factors at Admission - 10/15/21 1410       Core Components/Risk Factors/Patient Goals on Admission    Weight Management Yes;Obesity;Weight Loss    Intervention Weight Management: Develop a combined nutrition and exercise program designed to reach desired caloric intake, while maintaining appropriate intake of nutrient and fiber, sodium and fats, and appropriate energy expenditure required for the weight goal.;Weight Management: Provide education and appropriate resources to help participant work on and attain dietary goals.;Weight Management/Obesity: Establish reasonable short term and long term weight goals.;Obesity: Provide education and appropriate resources to help participant work on and attain dietary goals.    Admit Weight 230 lb 13.2 oz (104.7 kg)    Expected Outcomes Long Term: Adherence to nutrition and physical activity/exercise program aimed toward attainment of established weight goal;Short Term: Continue to assess and modify interventions until short term weight is achieved;Weight Maintenance: Understanding of the daily nutrition guidelines, which includes 25-35% calories from fat, 7% or less cal from saturated fats, less than '200mg'$  cholesterol, less than 1.5gm of sodium, & 5 or more servings of fruits and vegetables daily;Weight Loss: Understanding of general recommendations for a balanced deficit meal plan, which promotes 1-2 lb weight loss per week and includes a negative energy balance of [phone] kcal/d;Understanding recommendations for meals to include 15-35% energy as protein, 25-35% energy from fat, 35-60% energy from carbohydrates, less than '200mg'$  of dietary cholesterol, 20-35 gm of total fiber daily;Understanding of  distribution of calorie intake throughout the day with the consumption of 4-5 meals/snacks    Improve shortness of breath with ADL's Yes    Intervention Provide education, individualized exercise plan and daily activity instruction to help decrease symptoms of SOB with activities of daily living.    Expected Outcomes Short Term: Improve cardiorespiratory fitness to achieve a reduction of symptoms when performing ADLs;Long Term: Be able to perform more ADLs without symptoms or delay the onset of symptoms    Diabetes Yes    Intervention Provide education about signs/symptoms and action to take for hypo/hyperglycemia.;Provide education about proper nutrition, including hydration, and aerobic/resistive exercise prescription along with prescribed medications to achieve blood glucose in normal ranges: Fasting glucose 65-99 mg/dL    Expected Outcomes Short Term: Participant verbalizes understanding of the signs/symptoms and immediate care of hyper/hypoglycemia, proper foot care and importance of medication, aerobic/resistive  exercise and nutrition plan for blood glucose control.;Long Term: Attainment of HbA1C < 7%.    Heart Failure Yes    Intervention Provide a combined exercise and nutrition program that is supplemented with education, support and counseling about heart failure. Directed toward relieving symptoms such as shortness of breath, decreased exercise tolerance, and extremity edema.    Expected Outcomes Improve functional capacity of life;Short term: Attendance in program 2-3 days a week with increased exercise capacity. Reported lower sodium intake. Reported increased fruit and vegetable intake. Reports medication compliance.;Short term: Daily weights obtained and reported for increase. Utilizing diuretic protocols set by physician.;Long term: Adoption of self-care skills and reduction of barriers for early signs and symptoms recognition and intervention leading to self-care maintenance.     Hypertension Yes    Intervention Provide education on lifestyle modifcations including regular physical activity/exercise, weight management, moderate sodium restriction and increased consumption of fresh fruit, vegetables, and low fat dairy, alcohol moderation, and smoking cessation.;Monitor prescription use compliance.    Expected Outcomes Short Term: Continued assessment and intervention until BP is < 140/30m HG in hypertensive participants. < 130/834mHG in hypertensive participants with diabetes, heart failure or chronic kidney disease.;Long Term: Maintenance of blood pressure at goal levels.    Lipids Yes    Intervention Provide education and support for participant on nutrition & aerobic/resistive exercise along with prescribed medications to achieve LDL '70mg'$ , HDL >'40mg'$ .    Expected Outcomes Short Term: Participant states understanding of desired cholesterol values and is compliant with medications prescribed. Participant is following exercise prescription and nutrition guidelines.;Long Term: Cholesterol controlled with medications as prescribed, with individualized exercise RX and with personalized nutrition plan. Value goals: LDL < '70mg'$ , HDL > 40 mg.    Stress Yes    Intervention Offer individual and/or small group education and counseling on adjustment to heart disease, stress management and health-related lifestyle change. Teach and support self-help strategies.;Refer participants experiencing significant psychosocial distress to appropriate mental health specialists for further evaluation and treatment. When possible, include family members and significant others in education/counseling sessions.    Expected Outcomes Short Term: Participant demonstrates changes in health-related behavior, relaxation and other stress management skills, ability to obtain effective social support, and compliance with psychotropic medications if prescribed.;Long Term: Emotional wellbeing is indicated by absence of  clinically significant psychosocial distress or social isolation.             Core Components/Risk Factors/Patient Goals Review:    Core Components/Risk Factors/Patient Goals at Discharge (Final Review):    ITP Comments:  ITP Comments     Row Name 10/15/21 1519           ITP Comments Dr TrFransico Himedical Director, Introduction to [facility]/ Intensive Cardiac Rehab. Initial Orientation packet reviewed with the patient.                Comments: Participant attended orientation for the cardiac rehabilitation program on  10/15/2021  to perform initial intake and exercise walk test. [place] and [place] packet was reviewed. Completed 6-minute nustep test,due to deconditioning measurements, initial ITP, and exercise prescription. Vital signs 14465-035ystolic/ 7446-56iastolic. Telemetry-Sinus Brady with a bundle branch block occasional PVC symptomatic. See previous documentation. Patient uses a rollator for stability as she is very deconditioned.MaHarrell GaveN BSN   Service time was from 1050 to 1312.

## 2021-10-16 NOTE — Telephone Encounter (Signed)
Pt returned call and aware of recommendations Meds sent to pharmacy Patient requested to have labs done at upcoming appt 9/21

## 2021-10-19 ENCOUNTER — Encounter (HOSPITAL_COMMUNITY)
Admission: RE | Admit: 2021-10-19 | Discharge: 2021-10-19 | Disposition: A | Discharge status: Home / Self Care | Payer: Medicare Other | Source: Ambulatory Visit | Attending: Cardiology | Admitting: Cardiology

## 2021-10-19 DIAGNOSIS — Z9861 Coronary angioplasty status: Secondary | ICD-10-CM

## 2021-10-19 LAB — GLUCOSE, CAPILLARY
Glucose-Capillary: 144 mg/dL — ABNORMAL HIGH (ref 70–99)
Glucose-Capillary: 161 mg/dL — ABNORMAL HIGH (ref 70–99)

## 2021-10-19 NOTE — Progress Notes (Signed)
Daily Session Note  Patient Details  Name: [name] [name] [name]: [id number] Date of Birth: 05-29-1945 Referring Provider:   Flowsheet Row INTENSIVE CARDIAC REHAB ORIENT from 10/15/2021 in [place]  Referring Provider [name] Champagne, MD       Encounter Date: 10/19/2021  Check In:  Session Check In - 10/19/21 1248       Check-In   Supervising physician immediately available to respond to emergencies [facility] - Physician supervision    Physician(s) Dr. Sallyanne Kuster    Location [facility]    Staff Present Lesly Rubenstein, MS, ACSM-CEP, CCRP, Exercise Physiologist;Zyad Boomer, RN, BSN;Johnny Starleen Blue, MS, Exercise Physiologist;Jetta Gilford Rile BS, ACSM-CEP, Exercise Physiologist;Bailey Pearline Cables, MS, Exercise Physiologist    Virtual Visit No    Medication changes reported     No    Fall or balance concerns reported    Yes   patient uses a rollator cannot walk long distances   Tobacco Cessation No Change    Current number of cigarettes/nicotine per day     0    Warm-up and Cool-down Performed as group-led instruction    Resistance Training Performed Yes    VAD Patient? No    PAD/SET Patient? No      Pain Assessment   Currently in Pain? No/denies    Pain Score 0-No pain    Multiple Pain Sites No             Capillary Blood Glucose: Results for orders placed or performed during the hospital encounter of 10/19/21 (from the past 24 hour(s))  Glucose, capillary     Status: Abnormal   Collection Time: 10/19/21  1:26 PM  Result Value Ref Range   Glucose-Capillary 161 (H) 70 - 99 mg/dL   *Note: Due to a large number of results and/or encounters for the requested time period, some results have not been displayed. A complete set of results can be found in Results Review.      Social History   Tobacco Use  Smoking Status Never  Smokeless Tobacco Never    Goals Met:  Exercise tolerated well No report of concerns or symptoms today Strength  training completed today  Goals Unmet:  Not Applicable  Comments: Pt started cardiac rehab today.  Pt tolerated light exercise without difficulty. VSS, telemetry-Sinus brady, Bundle branch blockasymptomatic.  Medication list reconciled. Pt denies barriers to medicaiton compliance. [name] [name] is very  deconditioned and used a rollator. Encouraged [name] [name] to take several rest breaks. [name] [name] exercise on 2l/min of oxygen and did her stretches in the chair. PSYCHOSOCIAL ASSESSMENT:  PHQ-2.   QUALITY OF LIFE SCORE REVIEW  Pt completed Quality of Life survey as a participant in Cardiac Rehab.  Scores 21.0 or below are considered low.  Pt score very low in several areas Overall 14.19, Health and Function 7.20, socioeconomic 24.79, physiological and spiritual 9.86, family 26.40. Patient quality of life slightly altered by physical constraints which limits ability to perform as prior to recent cardiac illness.[name] [name] admits to being currently depressed and is taking an antidepressant currently .  Offered emotional support and reassurance. [name] [name] is dissatisfied with her health and is worried about her mother who is 36 years old and her daughter who has lupus. [name] [name] says she is not opposed to receiving counseling. Will forward quality of life questionnaire to her primary care provider Dr Sandi Mariscal for review.  Will continue to monitor and intervene as necessary.    Pt oriented to exercise equipment and routine.  Understanding verbalized. Harrell Gave RN BSN    Dr. Fransico Him is Medical Director for Cardiac Rehab at [facility].

## 2021-10-20 NOTE — Progress Notes (Signed)
Cardiac Individual Treatment Plan  Patient Details  Name: [name] [name] [name]: [id number] Date of Birth: Jun 09, 1945 Referring Provider:   Flowsheet Row [name] ORIENT from 10/15/2021 in [place]  Referring Provider Loralie Champagne, MD       Initial Encounter Date:  [place] from 10/15/2021 in [place]  Date 10/15/21       Visit Diagnosis: 04/20/21 S/P PTCA p RCA for ISR  Patient's Home Medications on Admission:  Current Outpatient Medications:    acetaminophen (TYLENOL) 500 MG tablet, Take 500 mg by mouth every 6 (six) hours as needed for mild pain, moderate pain, fever or headache., Disp: , Rfl:    albuterol (VENTOLIN HFA) 108 (90 Base) MCG/ACT inhaler, Inhale 2 puffs into the lungs every 4 (four) hours as needed for wheezing or shortness of breath. , Disp: , Rfl:    Alirocumab (PRALUENT) 150 MG/ML SOAJ, Inject 150 mg into the skin every 14 (fourteen) days., Disp: 6 mL, Rfl: 3   ALPRAZolam (XANAX) 0.5 MG tablet, Take 1-2 tablets (0.5-1 mg total) by mouth See admin instructions. Take one tablet (0.5 mg) by mouth every morning and two tablets (1 mg) at night, may also take 1 tablet (0.5 mg) midday as needed for anxiety, Disp: , Rfl:    amLODipine (NORVASC) 10 MG tablet, Take 10 mg by mouth at bedtime., Disp: , Rfl:    apixaban (ELIQUIS) 5 MG TABS tablet, Take 1 tablet (5 mg total) by mouth 2 (two) times daily., Disp: 180 tablet, Rfl: 1   B Complex-C (B-COMPLEX WITH VITAMIN C) tablet, Take 1 tablet by mouth in the morning., Disp: , Rfl:    bisacodyl (DULCOLAX) 5 MG EC tablet, Take 5 mg by mouth daily as needed for moderate constipation., Disp: , Rfl:    buPROPion (WELLBUTRIN XL) 150 MG 24 hr tablet, Take 150 mg by mouth every morning., Disp: , Rfl:    carboxymethylcellulose (REFRESH PLUS) 0.5 % SOLN, 1 drop in the morning, at noon, and at bedtime., Disp: , Rfl:     cholecalciferol (VITAMIN D3) 25 MCG (1000 UNIT) tablet, Take 1,000 Units by mouth in the morning., Disp: , Rfl:    clopidogrel (PLAVIX) 75 MG tablet, Take 75 mg by mouth every morning., Disp: , Rfl:    denosumab (PROLIA) 60 MG/ML SOSY injection, Inject 60 mg into the skin every 6 (six) months., Disp: , Rfl:    DULoxetine (CYMBALTA) 30 MG capsule, Take 1 capsule (30 mg total) by mouth daily., Disp: 30 capsule, Rfl: 0   ezetimibe (ZETIA) 10 MG tablet, Take 1 tablet (10 mg total) by mouth at bedtime., Disp: 90 tablet, Rfl: 3   fluocinonide cream (LIDEX) 0.93 %, Apply 1 application. topically 2 (two) times daily as needed (irritation)., Disp: , Rfl:    Fluticasone-Salmeterol (ADVAIR) 250-50 MCG/DOSE AEPB, Inhale 1 puff into the lungs daily., Disp: , Rfl:    gabapentin (NEURONTIN) 600 MG tablet, Take 600-1,200 mg by mouth See admin instructions. Take 1 tablet (600 mg) by mouth in the morning & take 2 tablets  (1200 mg) by mouth at bedtime, Disp: , Rfl:    Insulin Glargine (LANTUS SOLOSTAR) 100 UNIT/ML Solostar Pen, Inject 28-44 Units into the skin at bedtime. Per sliding scale, Disp: , Rfl:    insulin lispro (HUMALOG) 100 UNIT/ML KwikPen, Inject 14-20 Units into the skin See admin instructions. Inject 14 units subcutaneously prior to breakfast and supper; add  4 units for CBG >200, Disp: , Rfl:    ipratropium-albuterol (DUONEB) 0.5-2.5 (3) MG/3ML SOLN, Take 3 mLs by nebulization 2 (two) times daily as needed (asthma)., Disp: , Rfl:    loperamide (IMODIUM A-D) 2 MG tablet, Take 2 mg by mouth 4 (four) times daily as needed for diarrhea or loose stools., Disp: , Rfl:    methocarbamol (ROBAXIN) 500 MG tablet, Take 500 mg by mouth See admin instructions. Take 1 tablet (500 mg) by mouth scheduled at bedtime & may take 2 additional doses during the day if needed for back pain., Disp: , Rfl:    Multiple Vitamin (MULTIVITAMIN WITH MINERALS) TABS tablet, Take 1 tablet by mouth every morning. Centrum - Women over 44,  Disp: , Rfl:    Multiple Vitamins-Minerals ([place]) CAPS, Take 1 capsule by mouth every morning., Disp: , Rfl:    nitroGLYCERIN (NITROSTAT) 0.3 MG SL tablet, Place 1 tablet (0.3 mg total) under the tongue every 5 (five) minutes x 3 doses as needed for chest pain., Disp: 25 tablet, Rfl: 1   ondansetron (ZOFRAN-ODT) 4 MG disintegrating tablet, Take 4 mg by mouth 2 (two) times daily as needed for nausea or vomiting., Disp: , Rfl:    OXYGEN, Inhale 2 L into the lungs at bedtime as needed (shortness of breath). , Disp: , Rfl:    pantoprazole (PROTONIX) 40 MG tablet, Take 1 tablet (40 mg total) by mouth daily., Disp: 30 tablet, Rfl: 0   potassium chloride SA (KLOR-CON) 20 MEQ tablet, Take 1 tablet (20 mEq total) by mouth daily., Disp: 90 tablet, Rfl: 3   Pyridoxine HCl (VITAMIN B-6 PO), Take 1 tablet by mouth every morning., Disp: , Rfl:    ranolazine (RANEXA) 1000 MG SR tablet, TAKE ONE TABLET BY MOUTH TWICE DAILY, Disp: 180 tablet, Rfl: 3   rosuvastatin (CRESTOR) 40 MG tablet, Take 1 tablet (40 mg total) by mouth daily. (Patient taking differently: Take 40 mg by mouth at bedtime.), Disp: 90 tablet, Rfl: 3   sacubitril-valsartan (ENTRESTO) 97-103 MG, Take 1 tablet by mouth 2 (two) times daily., Disp: 180 tablet, Rfl: 3   Semaglutide,0.25 or 0.'5MG'$ /DOS, 2 MG/3ML SOPN, Inject 0.'25mg'$  SQ once weekly x 4 weeks, then increase to 0.'5mg'$  SQ once weekly (Patient taking differently: every Wednesday. Inject 0.'25mg'$  SQ once weekly x 4 weeks, then increase to 0.'5mg'$  SQ once weekly), Disp: 3 mL, Rfl: 1   torsemide (DEMADEX) 20 MG tablet, Take 0.5 tablets (10 mg total) by mouth daily., Disp: 15 tablet, Rfl: 2   traZODone (DESYREL) 50 MG tablet, Take 1 tablet (50 mg total) by mouth at bedtime., Disp: 90 tablet, Rfl: 1   vitamin B-12 (CYANOCOBALAMIN) 1000 MCG tablet, Take 1,000 mcg by mouth every morning. , Disp: , Rfl:    zolpidem (AMBIEN) 10 MG tablet, Take 10 mg by mouth at bedtime., Disp: , Rfl:    Past Medical History: Past Medical History:  Diagnosis Date   Anemia    hx   Anxiety    Asthma    Basal cell carcinoma 05/2014   "left shoulder"   Bundle branch block, left    chronic/notes 07/18/2013   CHF (congestive heart failure) (HCC)    Chronic insomnia 05/06/2015   Chronic kidney disease    frequency, sees dr Jamal Maes every 4 to 6 months (01/16/2018)   Chronic lower back pain    Claustrophobia    Common migraine 05/14/2014   Coronary artery disease    MI in 2001,  2002, 2006, 2011, 2014   Depression    Diabetic peripheral neuropathy ([place]) 01/12/2019   GERD (gastroesophageal reflux disease)    H/O hiatal hernia    Headache    "at least 2/month" (01/16/2018)   Heart murmur    Hyperlipidemia    Hypertension    Memory change 05/14/2014   Migraine    "5-6/year"  (01/16/2018)   Obesity 01-2010   Obstructive sleep apnea    "can't wear machine; I have claustrophobia" (01/16/2018), states she had a 2nd sleep study and does not have sleep apnea, her O2 decreases and now is on 2 L of O2 at night.   On home oxygen therapy    "2L at night and prn during daytime" (01/16/2018)   Osteoarthritis    "knees and hands" (01/16/2018)   Peripheral vascular disease (HCC)    ? numbness, tingling arms and legs   PONV (postoperative nausea and vomiting)    Stroke ([place])    2014, 2015, 2016   Type II diabetes mellitus ([place])    Ventral hernia    hx of    Tobacco Use: Social History   Tobacco Use  Smoking Status Never  Smokeless Tobacco Never    Labs: Review Flowsheet  More data exists      Latest Ref Rng & Units 01/17/2019 06/06/2019 05/13/2020 03/31/2021 04/20/2021  Labs for ITP Cardiac and Pulmonary Rehab  Cholestrol 0 - 200 mg/dL - 131  121  173  -  LDL (calc) 0 - 99 mg/dL - 57  58  102  -  HDL-C >40 mg/dL - 57  51  58  -  Trlycerides <150 mg/dL - 83  58  65  -  Hemoglobin A1c 4.8 - 5.6 % 7.2  - - - -  Bicarbonate 20.0 - 28.0 mmol/L 20.0 - 28.0 mmol/L - - - - 27.2   27.5   TCO2 22 - 32 mmol/L 22 - 32 mmol/L - - - - 29  29   O2 Saturation % % - - - - 79  80     Capillary Blood Glucose: Lab Results  Component Value Date   GLUCAP 161 (H) 10/19/2021   GLUCAP 144 (H) 10/19/2021   GLUCAP 196 (H) 06/02/2021   GLUCAP 133 (H) 06/02/2021   GLUCAP 114 (H) 06/02/2021     Exercise Target Goals: Exercise Program Goal: Individual exercise prescription set using results from initial 6 min walk test and THRR while considering  patient's activity barriers and safety.   Exercise Prescription Goal: Initial exercise prescription builds to 30-45 minutes a day of aerobic activity, 2-3 days per week.  Home exercise guidelines will be given to patient during program as part of exercise prescription that the participant will acknowledge.  Activity Barriers & Risk Stratification:  Activity Barriers & Cardiac Risk Stratification - 10/15/21 1439       Activity Barriers & Cardiac Risk Stratification   Activity Barriers Arthritis;Back Problems;Neck/Spine Problems;Joint Problems;Deconditioning;Muscular Weakness;Shortness of Breath;Decreased Ventricular Function;Chest Pain/Angina;Balance Concerns;History of Falls;Assistive Device    Cardiac Risk Stratification High             6 Minute Walk:  6 Minute Walk     Row Name 10/15/21 1413         6 Minute Walk   Phase Initial  Perfomed on a Nustep     Distance 1115 feet     Walk Time 6 minutes     # of Rest Breaks 1  From 3:00 to 3:49  MPH 2.11     METS 1.3     RPE 11     Perceived Dyspnea  2     VO2 Peak 4.61     Symptoms Yes (comment)     Comments left knee pain 2/10, low back ppain 4/10, angina(chest pressure) 2/10     Resting HR 58 bpm     Resting BP 149/80     Resting Oxygen Saturation  96 %     Exercise Oxygen Saturation  during 6 min walk 95 %     Max Ex. HR 57 bpm     Max Ex. BP 170/74     2 Minute Post BP 154/80  Exit BP was 118/80       Interval Oxygen   Interval Oxygen? Yes      Baseline Oxygen Saturation % 96 %     1 Minute Oxygen Saturation % 96 %     1 Minute Liters of Oxygen 0 L     2 Minute Oxygen Saturation % 97 %     2 Minute Liters of Oxygen 0 L     3 Minute Oxygen Saturation % 96 %     3 Minute Liters of Oxygen 0 L     4 Minute Oxygen Saturation % 98 %     4 Minute Liters of Oxygen 0 L     5 Minute Oxygen Saturation % 97 %     5 Minute Liters of Oxygen 0 L     6 Minute Oxygen Saturation % 97 %     6 Minute Liters of Oxygen 0 L     2 Minute Post Oxygen Saturation % 96 %     2 Minute Post Liters of Oxygen 0 L              Oxygen Initial Assessment:   Oxygen Re-Evaluation:   Oxygen Discharge (Final Oxygen Re-Evaluation):   Initial Exercise Prescription:  Initial Exercise Prescription - 10/15/21 1400       Date of Initial Exercise RX and Referring Provider   Date 10/15/21    Referring Provider Loralie Champagne, MD    Expected Discharge Date 12/11/21      NuStep   Level 1    SPM 70    Minutes 20    METs 1.3      Prescription Details   Frequency (times per week) 2    Duration Progress to 30 minutes of continuous aerobic without signs/symptoms of physical distress      Intensity   THRR 40-80% of Max Heartrate 59-118    Ratings of Perceived Exertion 11-13    Perceived Dyspnea 0-4      Progression   Progression Continue progressive overload as per policy without signs/symptoms or physical distress.      Resistance Training   Training Prescription Yes    Weight 1 lb    Reps 10-15             Perform Capillary Blood Glucose checks as needed.  Exercise Prescription Changes:   Exercise Prescription Changes     Row Name 10/19/21 1400             Response to Exercise   Blood Pressure (Admit) 138/60       Blood Pressure (Exercise) 142/78       Blood Pressure (Exit) 106/58       Heart Rate (Admit) 74 bpm       Heart Rate (Exercise) 78 bpm  Heart Rate (Exit) 59 bpm       Oxygen Saturation (Exercise) 95 %  O2 -  2 L/min via n/c       Oxygen Saturation (Exit) 96 %       Rating of Perceived Exertion (Exercise) 11       Perceived Dyspnea (Exercise) 1       Symptoms Nausea during warm-up       Comments Pt's first day in the [facility]       Duration Progress to 30 minutes of  aerobic without signs/symptoms of physical distress       Intensity THRR unchanged         Progression   Progression Continue to progress workloads to maintain intensity without signs/symptoms of physical distress.       Average METs 1.6         Resistance Training   Training Prescription Yes       Weight 1 lb       Reps 10-15       Time 10 Minutes         Interval Training   Interval Training No         Oxygen   Oxygen Continuous       Liters 2         NuStep   Level 1       Minutes 20       METs 1.6                Exercise Comments:   Exercise Comments     Row Name 10/19/21 1437           Exercise Comments Pt's first day in the [facility] program. Patient uses rolling walker. The patient is deconditioned and did warm up, weights and cool down from chair. Pt reports some nausea during the warm-up. Pt took a break and it subsided. Pt placed on O2 via n/c @ 2 L/min.  Pt was able to complete 20 minutes on the Nustep by taking breaks every 3-4 minutes.  Patient had one-to-one instruction on warm-up, cool-down, and weights. Pt will require slow progression.                Exercise Goals and Review:   Exercise Goals     Row Name 10/15/21 1434             Exercise Goals   Increase Physical Activity Yes       Intervention Provide advice, education, support and counseling about physical activity/exercise needs.;Develop an individualized exercise prescription for aerobic and resistive training based on initial evaluation findings, risk stratification, comorbidities and participant's personal goals.       Expected Outcomes Short Term: Attend rehab on a regular basis to increase amount of physical activity.;Long  Term: Add in home exercise to make exercise part of routine and to increase amount of physical activity.;Long Term: Exercising regularly at least 3-5 days a week.       Increase Strength and Stamina Yes       Intervention Develop an individualized exercise prescription for aerobic and resistive training based on initial evaluation findings, risk stratification, comorbidities and participant's personal goals.;Provide advice, education, support and counseling about physical activity/exercise needs.       Expected Outcomes Short Term: Increase workloads from initial exercise prescription for resistance, speed, and METs.;Short Term: Perform resistance training exercises routinely during rehab and add in resistance training at home;Long Term: Improve cardiorespiratory fitness, muscular endurance and strength  as measured by increased METs and functional capacity (6MWT)       Able to understand and use rate of perceived exertion (RPE) scale Yes       Intervention Provide education and explanation on how to use RPE scale       Expected Outcomes Short Term: Able to use RPE daily in rehab to express subjective intensity level;Long Term:  Able to use RPE to guide intensity level when exercising independently       Knowledge and understanding of Target Heart Rate Range (THRR) Yes       Intervention Provide education and explanation of THRR including how the numbers were predicted and where they are located for reference       Expected Outcomes Short Term: Able to state/look up THRR;Short Term: Able to use daily as guideline for intensity in rehab;Long Term: Able to use THRR to govern intensity when exercising independently       Understanding of Exercise Prescription Yes       Intervention Provide education, explanation, and written materials on patient's individual exercise prescription       Expected Outcomes Short Term: Able to explain program exercise prescription;Long Term: Able to explain home exercise  prescription to exercise independently                Exercise Goals Re-Evaluation :  Exercise Goals Re-Evaluation     Row Name 10/19/21 1434 10/19/21 1435           Exercise Goal Re-Evaluation   Exercise Goals Review Increase Physical Activity;Increase Strength and Stamina;Able to understand and use rate of perceived exertion (RPE) scale;Knowledge and understanding of Target Heart Rate Range (THRR);Understanding of Exercise Prescription (P)  Increase Physical Activity;Increase Strength and Stamina;Able to understand and use rate of perceived exertion (RPE) scale;Knowledge and understanding of Target Heart Rate Range (THRR);Understanding of Exercise Prescription      Comments Pt (P)  Pt's first day in the [facility] program. Pt understands the exercise Rx, THRR, and RPE scale.      Expected Outcomes -- Will continue to monitor patient and progress exercise workloads as tolerated.               Discharge Exercise Prescription (Final Exercise Prescription Changes):  Exercise Prescription Changes - 10/19/21 1400       Response to Exercise   Blood Pressure (Admit) 138/60    Blood Pressure (Exercise) 142/78    Blood Pressure (Exit) 106/58    Heart Rate (Admit) 74 bpm    Heart Rate (Exercise) 78 bpm    Heart Rate (Exit) 59 bpm    Oxygen Saturation (Exercise) 95 %   O2 - 2 L/min via n/c   Oxygen Saturation (Exit) 96 %    Rating of Perceived Exertion (Exercise) 11    Perceived Dyspnea (Exercise) 1    Symptoms Nausea during warm-up    Comments Pt's first day in the [facility]    Duration Progress to 30 minutes of  aerobic without signs/symptoms of physical distress    Intensity THRR unchanged      Progression   Progression Continue to progress workloads to maintain intensity without signs/symptoms of physical distress.    Average METs 1.6      Resistance Training   Training Prescription Yes    Weight 1 lb    Reps 10-15    Time 10 Minutes      Interval Training   Interval Training  No      Oxygen  Oxygen Continuous    Liters 2      NuStep   Level 1    Minutes 20    METs 1.6             Nutrition:  Target Goals: Understanding of nutrition guidelines, daily intake of sodium '1500mg'$ , cholesterol '200mg'$ , calories 30% from fat and 7% or less from saturated fats, daily to have 5 or more servings of fruits and vegetables.  Biometrics:  Pre Biometrics - 10/15/21 1105       Pre Biometrics   Waist Circumference 46 inches    Hip Circumference 58.5 inches    Waist to Hip Ratio 0.79 %    Triceps Skinfold 45 mm    % Body Fat 54.5 %    Grip Strength 18 kg    Flexibility --   Not performed due to low back poroblems   Single Leg Stand --   Uses walker, h/o of falls; not performed             Nutrition Therapy Plan and Nutrition Goals:  Nutrition Therapy & Goals - 10/19/21 1415       Nutrition Therapy   Diet Heart Healthy Diet    Drug/Food Interactions Statins/Certain Fruits      Personal Nutrition Goals   Nutrition Goal Patient to identify strategies for managing cardiovascular risk by attending the weekly [facility] education and nutrition courses.    Personal Goal #2 Patient to identify strategies for weight loss of 0.5-2.0# per week of weight loss    Personal Goal #3 Patient to limit to '1500mg'$  of sodium daily    Personal Goal #4 Patient to identify food sources and limit daily intake of saturated fat, trans fat, sodium, and refined carbohydrates.    Comments Patient reports that she has completed cardiac rehab previously. She reports highest adult weight is 330#; she is prescribed Ozempic for weight loss. Her husband does the grocery shopping and cooking; he is supporitve of making lifestyle/dietary changes.      Intervention Plan   Intervention Prescribe, educate and counsel regarding individualized specific dietary modifications aiming towards targeted core components such as weight, hypertension, lipid management, diabetes, heart failure and other  comorbidities.;Nutrition handout(s) given to patient.    Expected Outcomes Short Term Goal: Understand basic principles of dietary content, such as calories, fat, sodium, cholesterol and nutrients.;Long Term Goal: Adherence to prescribed nutrition plan.             Nutrition Assessments:  Nutrition Assessments - 10/19/21 1423       Rate Your Plate Scores   Pre Score 78            MEDIFICTS Score Key: ?70 Need to make dietary changes  40-70 Heart Healthy Diet ? 40 Therapeutic Level Cholesterol Diet   Flowsheet Row [name] REHAB from 10/19/2021 in [place]  Picture Your Plate Total Score on Admission 78      Picture Your Plate Scores: <37 Unhealthy dietary pattern with much room for improvement. 41-50 Dietary pattern unlikely to meet recommendations for good health and room for improvement. 51-60 More healthful dietary pattern, with some room for improvement.  >60 Healthy dietary pattern, although there may be some specific behaviors that could be improved.    Nutrition Goals Re-Evaluation:  Nutrition Goals Re-Evaluation     [place] Name 10/19/21 1415             Goals   Current Weight 232 lb 2.3 oz (105.3  kg)       Comment LDL 102, A1c 6.3       Expected Outcome Patient reports that she has completed cardiac rehab previously. She reports highest adult weight is 330#; she is prescribed Ozempic for weight loss. Her husband does the grocery shopping and cooking; he is supporitve of making lifestyle/dietary changes. She does report meal skipping and denies snacking.                Nutrition Goals Re-Evaluation:  Nutrition Goals Re-Evaluation     [place] Name 10/19/21 1415             Goals   Current Weight 232 lb 2.3 oz (105.3 kg)       Comment LDL 102, A1c 6.3       Expected Outcome Patient reports that she has completed cardiac rehab previously. She reports highest adult weight is 330#; she is prescribed Ozempic for  weight loss. Her husband does the grocery shopping and cooking; he is supporitve of making lifestyle/dietary changes. She does report meal skipping and denies snacking.                Nutrition Goals Discharge (Final Nutrition Goals Re-Evaluation):  Nutrition Goals Re-Evaluation - 10/19/21 1415       Goals   Current Weight 232 lb 2.3 oz (105.3 kg)    Comment LDL 102, A1c 6.3    Expected Outcome Patient reports that she has completed cardiac rehab previously. She reports highest adult weight is 330#; she is prescribed Ozempic for weight loss. Her husband does the grocery shopping and cooking; he is supporitve of making lifestyle/dietary changes. She does report meal skipping and denies snacking.             Psychosocial: Target Goals: Acknowledge presence or absence of significant depression and/or stress, maximize coping skills, provide positive support system. Participant is able to verbalize types and ability to use techniques and skills needed for reducing stress and depression.  Initial Review & Psychosocial Screening:  Initial Psych Review & Screening - 10/15/21 1531       Initial Review   Current issues with Current Stress Concerns;History of Depression    Source of Stress Concerns Chronic Illness;Unable to participate in former interests or hobbies;Unable to perform yard/household activities;Family    Comments History of Depression, Mother is under hospice care. Daughter has lupus.      Family Dynamics   Good Support System? Yes   [name] [name] has her huband, daughter and sister for support.     Barriers   Psychosocial barriers to participate in program The patient should benefit from training in stress management and relaxation.      Screening Interventions   Interventions Encouraged to exercise;To provide support and resources with identified psychosocial needs;Provide feedback about the scores to participant    Expected Outcomes Long Term Goal: Stressors or current  issues are controlled or eliminated.;Short Term goal: Identification and review with participant of any Quality of Life or Depression concerns found by scoring the questionnaire.;Long Term goal: The participant improves quality of Life and PHQ9 Scores as seen by post scores and/or verbalization of changes             Quality of Life Scores:  Quality of Life - 10/15/21 1408       Quality of Life   Select Quality of Life      Quality of Life Scores   Health/Function Pre 7.2 %    Socioeconomic Pre 24.79 %  Psych/Spiritual Pre 9.86 %    Family Pre 26.4 %    GLOBAL Pre 14.19 %            Scores of 19 and below usually indicate a poorer quality of life in these areas.  A difference of  2-3 points is a clinically meaningful difference.  A difference of 2-3 points in the total score of the Quality of Life Index has been associated with significant improvement in overall quality of life, self-image, physical symptoms, and general health in studies assessing change in quality of life.  PHQ-9: Review Flowsheet  More data exists      10/15/2021 10/12/2018 10/28/2016 09/29/2016 06/23/2016  Depression screen PHQ 2/9  Decreased Interest 1 2 0 0 0  Down, Depressed, Hopeless 0 1 - 0 1  PHQ - 2 Score 1 3 0 0 1  Altered sleeping - 1 - - -  Tired, decreased energy - 1 - - -  Change in appetite - 0 - - -  Feeling bad or failure about yourself  - 1 - - -  Trouble concentrating - 1 - - -  Moving slowly or fidgety/restless - 0 - - -  Suicidal thoughts - 0 - - -  PHQ-9 Score - 7 - - -  Difficult doing work/chores - Somewhat difficult - - -   Interpretation of Total Score  Total Score Depression Severity:  1-4 = Minimal depression, 5-9 = Mild depression, 10-14 = Moderate depression, 15-19 = Moderately severe depression, 20-27 = Severe depression   Psychosocial Evaluation and Intervention:   Psychosocial Re-Evaluation:  Psychosocial Re-Evaluation     Row Name 10/20/21 0920              Psychosocial Re-Evaluation   Current issues with Current Stress Concerns;Current Depression;History of Depression       Comments Reviewed quality of life admits to being depressed will forward to Dr Sandi Mariscal       Expected Outcomes [name] [name] will have decreased or controlled depression upon completion of [name] cardiac rehab       Interventions Physician referral;Encouraged to attend Cardiac Rehabilitation for the exercise;Stress management education       Continue Psychosocial Services  Follow up required by staff         Initial Review   Source of Stress Concerns Chronic Illness;Poor Coping Skills;Unable to perform yard/household activities;Unable to participate in former interests or hobbies;Family       Comments Will continue to monitor and offer support as needed.                Psychosocial Discharge (Final Psychosocial Re-Evaluation):  Psychosocial Re-Evaluation - 10/20/21 0920       Psychosocial Re-Evaluation   Current issues with Current Stress Concerns;Current Depression;History of Depression    Comments Reviewed quality of life admits to being depressed will forward to Dr Sandi Mariscal    Expected Outcomes [name] [name] will have decreased or controlled depression upon completion of [name] cardiac rehab    Interventions Physician referral;Encouraged to attend Cardiac Rehabilitation for the exercise;Stress management education    Continue Psychosocial Services  Follow up required by staff      Initial Review   Source of Stress Concerns Chronic Illness;Poor Coping Skills;Unable to perform yard/household activities;Unable to participate in former interests or hobbies;Family    Comments Will continue to monitor and offer support as needed.             Vocational Rehabilitation: Provide vocational rehab assistance to qualifying candidates.  Vocational Rehab Evaluation & Intervention:  Vocational Rehab - 10/15/21 1537       Initial Vocational Rehab Evaluation &  Intervention   Assessment shows need for Vocational Rehabilitation No   [name] [name] is retired and does not need vocational rehab at this time            Education: Education Goals: Education classes will be provided on a weekly basis, covering required topics. Participant will state understanding/return demonstration of topics presented.    Education     Row Name 10/19/21 1500     Education   Cardiac Education Topics Pritikin   Select Workshops     Workshops   Educator Exercise Physiologist   Select Exercise   Exercise Workshop Exercise Basics: Building Your Action Plan   Instruction Review Code 1- Verbalizes Understanding   Class Start Time 1411   Class Stop Time 1451   Class Time Calculation (min) 40 min            Core Videos: Exercise    Move It!  Clinical staff conducted group or individual video education with verbal and written material and guidebook.  Patient learns the recommended Pritikin exercise program. Exercise with the goal of living a long, healthy life. Some of the health benefits of exercise include controlled diabetes, healthier blood pressure levels, improved cholesterol levels, improved heart and lung capacity, improved sleep, and better body composition. Everyone should speak with their doctor before starting or changing an exercise routine.  Biomechanical Limitations Clinical staff conducted group or individual video education with verbal and written material and guidebook.  Patient learns how biomechanical limitations can impact exercise and how we can mitigate and possibly overcome limitations to have an impactful and balanced exercise routine.  Body Composition Clinical staff conducted group or individual video education with verbal and written material and guidebook.  Patient learns that body composition (ratio of muscle mass to fat mass) is a key component to assessing overall fitness, rather than body weight alone. Increased fat mass, especially  visceral belly fat, can put [place] at increased risk for metabolic syndrome, type 2 diabetes, heart disease, and even death. It is recommended to combine diet and exercise (cardiovascular and resistance training) to improve your body composition. Seek guidance from your physician and exercise physiologist before implementing an exercise routine.  Exercise Action Plan Clinical staff conducted group or individual video education with verbal and written material and guidebook.  Patient learns the recommended strategies to achieve and enjoy long-term exercise adherence, including variety, self-motivation, self-efficacy, and positive decision making. Benefits of exercise include fitness, good health, weight management, more energy, better sleep, less stress, and overall [name]-being.  Medical   Heart Disease Risk Reduction Clinical staff conducted group or individual video education with verbal and written material and guidebook.  Patient learns our heart is our most vital organ as it circulates oxygen, nutrients, white blood cells, and hormones throughout the entire body, and carries waste away. Data supports a plant-based eating plan like the Pritikin Program for its effectiveness in slowing progression of and reversing heart disease. The video provides a number of recommendations to address heart disease.   Metabolic Syndrome and Belly Fat  Clinical staff conducted group or individual video education with verbal and written material and guidebook.  Patient learns what metabolic syndrome is, how it leads to heart disease, and how one can reverse it and keep it from coming back. You have metabolic syndrome if you have 3 of the following 5 criteria: abdominal  obesity, high blood pressure, high triglycerides, low HDL cholesterol, and high blood sugar.  Hypertension and Heart Disease Clinical staff conducted group or individual video education with verbal and written material and guidebook.  Patient learns that  high blood pressure, or hypertension, is very common in the [place]. Hypertension is largely due to excessive salt intake, but other important risk factors include being overweight, physical inactivity, drinking too much alcohol, smoking, and not eating enough potassium from fruits and vegetables. High blood pressure is a leading risk factor for heart attack, stroke, congestive heart failure, dementia, kidney failure, and premature death. Long-term effects of excessive salt intake include stiffening of the arteries and thickening of heart muscle and organ damage. Recommendations include ways to reduce hypertension and the risk of heart disease.  Diseases of Our Time - Focusing on Diabetes Clinical staff conducted group or individual video education with verbal and written material and guidebook.  Patient learns why the best way to stop diseases of our time is prevention, through food and other lifestyle changes. Medicine (such as prescription pills and surgeries) is often only a Band-Aid on the problem, not a long-term solution. Most common diseases of our time include obesity, type 2 diabetes, hypertension, heart disease, and cancer. The Pritikin Program is recommended and has been proven to help reduce, reverse, and/or prevent the damaging effects of metabolic syndrome.  Nutrition   Overview of the Pritikin Eating Plan  Clinical staff conducted group or individual video education with verbal and written material and guidebook.  Patient learns about the [place] for disease risk reduction. The [place] emphasizes a wide variety of unrefined, minimally-processed carbohydrates, like fruits, vegetables, whole grains, and legumes. Go, Caution, and Stop food choices are explained. Plant-based and lean animal proteins are emphasized. Rationale provided for low sodium intake for blood pressure control, low added sugars for blood sugar stabilization, and low added fats and oils for  coronary artery disease risk reduction and weight management.  Calorie Density  Clinical staff conducted group or individual video education with verbal and written material and guidebook.  Patient learns about calorie density and how it impacts the Pritikin Eating Plan. Knowing the characteristics of the food you choose will help you decide whether those foods will lead to weight gain or weight loss, and whether you want to consume more or less of them. Weight loss is usually a side effect of the Pritikin Eating Plan because of its focus on low calorie-dense foods.  Label Reading  Clinical staff conducted group or individual video education with verbal and written material and guidebook.  Patient learns about the Pritikin recommended label reading guidelines and corresponding recommendations regarding calorie density, added sugars, sodium content, and whole grains.  Dining Out - Part 1  Clinical staff conducted group or individual video education with verbal and written material and guidebook.  Patient learns that restaurant meals can be sabotaging because they can be so high in calories, fat, sodium, and/or sugar. Patient learns recommended strategies on how to positively address this and avoid unhealthy pitfalls.  Facts on Fats  Clinical staff conducted group or individual video education with verbal and written material and guidebook.  Patient learns that lifestyle modifications can be just as effective, if not more so, as many medications for lowering your risk of heart disease. A Pritikin lifestyle can help to reduce your risk of inflammation and atherosclerosis (cholesterol build-up, or plaque, in the artery walls). Lifestyle interventions such as dietary choices and physical  activity address the cause of atherosclerosis. A review of the types of fats and their impact on blood cholesterol levels, along with dietary recommendations to reduce fat intake is also included.  Nutrition Action Plan   Clinical staff conducted group or individual video education with verbal and written material and guidebook.  Patient learns how to incorporate Pritikin recommendations into their lifestyle. Recommendations include planning and keeping personal health goals in mind as an important part of their success.  Healthy Mind-Set    Healthy Minds, Bodies, Hearts  Clinical staff conducted group or individual video education with verbal and written material and guidebook.  Patient learns how to identify when they are stressed. Video will discuss the impact of that stress, as [name] as the many benefits of stress management. Patient will also be introduced to stress management techniques. The way we think, act, and feel has an impact on our hearts.  How Our Thoughts Can Heal Our Hearts  Clinical staff conducted group or individual video education with verbal and written material and guidebook.  Patient learns that negative thoughts can cause depression and anxiety. This can result in negative lifestyle behavior and serious health problems. Cognitive behavioral therapy is an effective method to help control our thoughts in order to change and improve our emotional outlook.  Additional Videos:  Exercise    Improving Performance  Clinical staff conducted group or individual video education with verbal and written material and guidebook.  Patient learns to use a non-linear approach by alternating intensity levels and lengths of time spent exercising to help burn more calories and lose more body fat. Cardiovascular exercise helps improve heart health, metabolism, hormonal balance, blood sugar control, and recovery from fatigue. Resistance training improves strength, endurance, balance, coordination, reaction time, metabolism, and muscle mass. Flexibility exercise improves circulation, posture, and balance. Seek guidance from your physician and exercise physiologist before implementing an exercise routine and learn  your capabilities and proper form for all exercise.  Introduction to Yoga  Clinical staff conducted group or individual video education with verbal and written material and guidebook.  Patient learns about yoga, a discipline of the coming together of mind, breath, and body. The benefits of yoga include improved flexibility, improved range of motion, better posture and core strength, increased lung function, weight loss, and positive self-image. Yoga's heart health benefits include lowered blood pressure, healthier heart rate, decreased cholesterol and triglyceride levels, improved immune function, and reduced stress. Seek guidance from your physician and exercise physiologist before implementing an exercise routine and learn your capabilities and proper form for all exercise.  Medical   Aging: Enhancing Your Quality of Life  Clinical staff conducted group or individual video education with verbal and written material and guidebook.  Patient learns key strategies and recommendations to stay in good physical health and enhance quality of life, such as prevention strategies, having an advocate, securing a [place], and keeping a list of medications and system for tracking them. It also discusses how to avoid risk for bone loss.  Biology of Weight Control  Clinical staff conducted group or individual video education with verbal and written material and guidebook.  Patient learns that weight gain occurs because we consume more calories than we burn (eating more, moving less). Even if your body weight is normal, you may have higher ratios of fat compared to muscle mass. Too much body fat puts you at increased risk for cardiovascular disease, heart attack, stroke, type 2 diabetes, and obesity-related  cancers. In addition to exercise, following the [place] can help reduce your risk.  Decoding Lab Results  Clinical staff conducted group or individual video education  with verbal and written material and guidebook.  Patient learns that lab test reflects one measurement whose values change over time and are influenced by many factors, including medication, stress, sleep, exercise, food, hydration, pre-existing medical conditions, and more. It is recommended to use the knowledge from this video to become more involved with your lab results and evaluate your numbers to speak with your doctor.   Diseases of Our Time - Overview  Clinical staff conducted group or individual video education with verbal and written material and guidebook.  Patient learns that according to the CDC, 50% to 70% of chronic diseases (such as obesity, type 2 diabetes, elevated lipids, hypertension, and heart disease) are avoidable through lifestyle improvements including healthier food choices, listening to satiety cues, and increased physical activity.  Sleep Disorders Clinical staff conducted group or individual video education with verbal and written material and guidebook.  Patient learns how good quality and duration of sleep are important to overall health and [name]-being. Patient also learns about sleep disorders and how they impact health along with recommendations to address them, including discussing with a physician.  Nutrition  Dining Out - Part 2 Clinical staff conducted group or individual video education with verbal and written material and guidebook.  Patient learns how to plan ahead and communicate in order to maximize their dining experience in a healthy and nutritious manner. Included are recommended food choices based on the type of restaurant the patient is visiting.   Fueling a Best boy conducted group or individual video education with verbal and written material and guidebook.  There is a strong connection between our food choices and our health. Diseases like obesity and type 2 diabetes are very prevalent and are in large-part due to lifestyle  choices. The Pritikin Eating Plan provides plenty of food and hunger-curbing satisfaction. It is easy to follow, affordable, and helps reduce health risks.  Menu Workshop  Clinical staff conducted group or individual video education with verbal and written material and guidebook.  Patient learns that restaurant meals can sabotage health goals because they are often packed with calories, fat, sodium, and sugar. Recommendations include strategies to plan ahead and to communicate with the manager, chef, or server to help order a healthier meal.  Planning Your Eating Strategy  Clinical staff conducted group or individual video education with verbal and written material and guidebook.  Patient learns about the [place] and its benefit of reducing the risk of disease. The [place] does not focus on calories. Instead, it emphasizes high-quality, nutrient-rich foods. By knowing the characteristics of the foods, we choose, we can determine their calorie density and make informed decisions.  Targeting Your Nutrition Priorities  Clinical staff conducted group or individual video education with verbal and written material and guidebook.  Patient learns that lifestyle habits have a tremendous impact on disease risk and progression. This video provides eating and physical activity recommendations based on your personal health goals, such as reducing LDL cholesterol, losing weight, preventing or controlling type 2 diabetes, and reducing high blood pressure.  Vitamins and Minerals  Clinical staff conducted group or individual video education with verbal and written material and guidebook.  Patient learns different ways to obtain key vitamins and minerals, including through a recommended healthy diet. It is important to discuss all supplements  you take with your doctor.   Healthy Mind-Set    Smoking Cessation  Clinical staff conducted group or individual video education with verbal and  written material and guidebook.  Patient learns that cigarette smoking and tobacco addiction pose a serious health risk which affects millions of people. Stopping smoking will significantly reduce the risk of heart disease, lung disease, and many forms of cancer. Recommended strategies for quitting are covered, including working with your doctor to develop a successful plan.  Culinary   Becoming a Financial trader conducted group or individual video education with verbal and written material and guidebook.  Patient learns that cooking at home can be healthy, cost-effective, quick, and puts them in control. Keys to cooking healthy recipes will include looking at your recipe, assessing your equipment needs, planning ahead, making it simple, choosing cost-effective seasonal ingredients, and limiting the use of added fats, salts, and sugars.  Cooking - Breakfast and Snacks  Clinical staff conducted group or individual video education with verbal and written material and guidebook.  Patient learns how important breakfast is to satiety and nutrition through the entire day. Recommendations include key foods to eat during breakfast to help stabilize blood sugar levels and to prevent overeating at meals later in the day. Planning ahead is also a key component.  Cooking - Human resources officer conducted group or individual video education with verbal and written material and guidebook.  Patient learns eating strategies to improve overall health, including an approach to cook more at home. Recommendations include thinking of animal protein as a side on your plate rather than center stage and focusing instead on lower calorie dense options like vegetables, fruits, whole grains, and plant-based proteins, such as beans. Making sauces in large quantities to freeze for later and leaving the skin on your vegetables are also recommended to maximize your experience.  Cooking - Healthy Salads and  Dressing Clinical staff conducted group or individual video education with verbal and written material and guidebook.  Patient learns that vegetables, fruits, whole grains, and legumes are the foundations of the [place]. Recommendations include how to incorporate each of these in flavorful and healthy salads, and how to create homemade salad dressings. Proper handling of ingredients is also covered. Cooking - Soups and [employer] - Soups and Desserts Clinical staff conducted group or individual video education with verbal and written material and guidebook.  Patient learns that Pritikin soups and desserts make for easy, nutritious, and delicious snacks and meal components that are low in sodium, fat, sugar, and calorie density, while high in vitamins, minerals, and filling fiber. Recommendations include simple and healthy ideas for soups and desserts.   Overview     The Pritikin Solution Program Overview Clinical staff conducted group or individual video education with verbal and written material and guidebook.  Patient learns that the results of the [place] Program have been documented in more than 100 articles published in peer-reviewed journals, and the benefits include reducing risk factors for (and, in some cases, even reversing) high cholesterol, high blood pressure, type 2 diabetes, obesity, and more! An overview of the three key pillars of the Pritikin Program will be covered: eating [name], doing regular exercise, and having a healthy mind-set.  WORKSHOPS  Exercise: Exercise Basics: Building Your Action Plan Clinical staff led group instruction and group discussion with PowerPoint presentation and patient guidebook. To enhance the learning environment the use of posters, models and videos may be added.  At the conclusion of this workshop, patients will comprehend the difference between physical activity and exercise, as [name] as the benefits of incorporating both, into  their routine. Patients will understand the FITT (Frequency, Intensity, Time, and Type) principle and how to use it to build an exercise action plan. In addition, safety concerns and other considerations for exercise and cardiac rehab will be addressed by the presenter. The purpose of this lesson is to promote a comprehensive and effective weekly exercise routine in order to improve patients' overall level of fitness.   Managing Heart Disease: Your Path to a Healthier Heart Clinical staff led group instruction and group discussion with PowerPoint presentation and patient guidebook. To enhance the learning environment the use of posters, models and videos may be added.At the conclusion of this workshop, patients will understand the anatomy and physiology of the heart. Additionally, they will understand how Pritikin's three pillars impact the risk factors, the progression, and the management of heart disease.  The purpose of this lesson is to provide a high-level overview of the heart, heart disease, and how the Pritikin lifestyle positively impacts risk factors.  Exercise Biomechanics Clinical staff led group instruction and group discussion with PowerPoint presentation and patient guidebook. To enhance the learning environment the use of posters, models and videos may be added. Patients will learn how the structural parts of their bodies function and how these functions impact their daily activities, movement, and exercise. Patients will learn how to promote a neutral spine, learn how to manage pain, and identify ways to improve their physical movement in order to promote healthy living. The purpose of this lesson is to expose patients to common physical limitations that impact physical activity. Participants will learn practical ways to adapt and manage aches and pains, and to minimize their effect on regular exercise. Patients will learn how to maintain good posture while sitting, walking, and  lifting.  Balance Training and Fall Prevention  Clinical staff led group instruction and group discussion with PowerPoint presentation and patient guidebook. To enhance the learning environment the use of posters, models and videos may be added. At the conclusion of this workshop, patients will understand the importance of their sensorimotor skills (vision, proprioception, and the vestibular system) in maintaining their ability to balance as they age. Patients will apply a variety of balancing exercises that are appropriate for their current level of function. Patients will understand the common causes for poor balance, possible solutions to these problems, and ways to modify their physical environment in order to minimize their fall risk. The purpose of this lesson is to teach patients about the importance of maintaining balance as they age and ways to minimize their risk of falling.  WORKSHOPS   Nutrition:  Fueling a Scientist, research (physical sciences) led group instruction and group discussion with PowerPoint presentation and patient guidebook. To enhance the learning environment the use of posters, models and videos may be added. Patients will review the foundational principles of the [place] and understand what constitutes a serving size in each of the food groups. Patients will also learn Pritikin-friendly foods that are better choices when away from home and review make-ahead meal and snack options. Calorie density will be reviewed and applied to three nutrition priorities: weight maintenance, weight loss, and weight gain. The purpose of this lesson is to reinforce (in a group setting) the key concepts around what patients are recommended to eat and how to apply these guidelines when away from home by planning and  selecting Pritikin-friendly options. Patients will understand how calorie density may be adjusted for different weight management goals.  Mindful Eating  Clinical staff led  group instruction and group discussion with PowerPoint presentation and patient guidebook. To enhance the learning environment the use of posters, models and videos may be added. Patients will briefly review the concepts of the [place] and the importance of low-calorie dense foods. The concept of mindful eating will be introduced as [name] as the importance of paying attention to internal hunger signals. Triggers for non-hunger eating and techniques for dealing with triggers will be explored. The purpose of this lesson is to provide patients with the opportunity to review the basic principles of the [place], discuss the value of eating mindfully and how to measure internal cues of hunger and fullness using the Hunger Scale. Patients will also discuss reasons for non-hunger eating and learn strategies to use for controlling emotional eating.  Targeting Your Nutrition Priorities Clinical staff led group instruction and group discussion with PowerPoint presentation and patient guidebook. To enhance the learning environment the use of posters, models and videos may be added. Patients will learn how to determine their genetic susceptibility to disease by reviewing their family history. Patients will gain insight into the importance of diet as part of an overall healthy lifestyle in mitigating the impact of genetics and other environmental insults. The purpose of this lesson is to provide patients with the opportunity to assess their personal nutrition priorities by looking at their family history, their own health history and current risk factors. Patients will also be able to discuss ways of prioritizing and modifying the [place] for their highest risk areas  Menu  Clinical staff led group instruction and group discussion with PowerPoint presentation and patient guidebook. To enhance the learning environment the use of posters, models and videos may be added. Using menus  brought in from [employer], or printed from [employer], patients will apply the [employer] dining out guidelines that were presented in the [employer] video. Patients will also be able to practice these guidelines in a variety of provided scenarios. The purpose of this lesson is to provide patients with the opportunity to practice hands-on learning of the [place] with actual menus and practice scenarios.  Label Reading Clinical staff led group instruction and group discussion with PowerPoint presentation and patient guidebook. To enhance the learning environment the use of posters, models and videos may be added. Patients will review and discuss the Pritikin label reading guidelines presented in [employer]'s Label Reading Educational series video. Using fool labels brought in from local grocery stores and markets, patients will apply the label reading guidelines and determine if the packaged food meet the Pritikin guidelines. The purpose of this lesson is to provide patients with the opportunity to review, discuss, and practice hands-on learning of the Pritikin Label Reading guidelines with actual packaged food labels. [place] Workshops are designed to teach patients ways to prepare quick, simple, and affordable recipes at home. The importance of nutrition's role in chronic disease risk reduction is reflected in its emphasis in the overall [facility]ritikin program. By learning how to prepare essential core Pritikin Eating Plan recipes, patients will increase control over what they eat; be able to customize the flavor of foods without the use of added salt, sugar, or fat; and improve the quality of the food they consume. By learning a set of core recipes which are  easily assembled, quickly prepared, and affordable, patients are more likely to prepare more healthy foods at home. These workshops focus on convenient breakfasts, simple  entres, side dishes, and desserts which can be prepared with minimal effort and are consistent with nutrition recommendations for cardiovascular risk reduction. Cooking [employer] are taught by a Engineer, materials (RD) who has been trained by the [employer]. The chef or RD has a clear understanding of the importance of minimizing - if not completely eliminating - added fat, sugar, and sodium in recipes. Throughout the series of [place] Workshop sessions, patients will learn about healthy ingredients and efficient methods of cooking to build confidence in their capability to prepare    Cooking School weekly topics:  Adding Flavor- Sodium-Free  Fast and Healthy Breakfasts  Powerhouse Plant-Based Proteins  Satisfying Salads and Dressings  Simple Sides and Sauces  International Cuisine-Spotlight on the [place] Zones  Delicious Desserts  Savory Soups  Efficiency Cooking - Meals in a Snap  Tasty Appetizers and Snacks  Comforting Weekend Breakfasts  One-Pot Wonders   Fast Evening Meals  Easy [place] (Psychosocial): New Thoughts, New Behaviors Clinical staff led group instruction and group discussion with PowerPoint presentation and patient guidebook. To enhance the learning environment the use of posters, models and videos may be added. Patients will learn and practice techniques for developing effective health and lifestyle goals. Patients will be able to effectively apply the goal setting process learned to develop at least one new personal goal.  The purpose of this lesson is to expose patients to a new skill set of behavior modification techniques such as techniques setting SMART goals, overcoming barriers, and achieving new thoughts and new behaviors.  Managing Moods and Relationships Clinical staff led group instruction and group discussion with PowerPoint presentation and patient  guidebook. To enhance the learning environment the use of posters, models and videos may be added. Patients will learn how emotional and chronic stress factors can impact their health and relationships. They will learn healthy ways to manage their moods and utilize positive coping mechanisms. In addition, ICR patients will learn ways to improve communication skills. The purpose of this lesson is to expose patients to ways of understanding how one's mood and health are intimately connected. Developing a healthy outlook can help build positive relationships and connections with others. Patients will understand the importance of utilizing effective communication skills that include actively listening and being heard. They will learn and understand the importance of the "4 Cs" and especially Connections in fostering of a Healthy Mind-Set.  Healthy Sleep for a Healthy Heart Clinical staff led group instruction and group discussion with PowerPoint presentation and patient guidebook. To enhance the learning environment the use of posters, models and videos may be added. At the conclusion of this workshop, patients will be able to demonstrate knowledge of the importance of sleep to overall health, [name]-being, and quality of life. They will understand the symptoms of, and treatments for, common sleep disorders. Patients will also be able to identify daytime and nighttime behaviors which impact sleep, and they will be able to apply these tools to help manage sleep-related challenges. The purpose of this lesson is to provide patients with a general overview of sleep and outline the importance of quality sleep. Patients will learn about a few of the most common sleep disorders. Patients will also be introduced to the concept of "sleep hygiene," and discover  ways to self-manage certain sleeping problems through simple daily behavior changes. Finally, the workshop will motivate patients by clarifying the links between quality  sleep and their goals of heart-healthy living.   Recognizing and Reducing Stress Clinical staff led group instruction and group discussion with PowerPoint presentation and patient guidebook. To enhance the learning environment the use of posters, models and videos may be added. At the conclusion of this workshop, patients will be able to understand the types of stress reactions, differentiate between acute and chronic stress, and recognize the impact that chronic stress has on their health. They will also be able to apply different coping mechanisms, such as reframing negative self-talk. Patients will have the opportunity to practice a variety of stress management techniques, such as deep abdominal breathing, progressive muscle relaxation, and/or guided imagery.  The purpose of this lesson is to educate patients on the role of stress in their lives and to provide healthy techniques for coping with it.  Learning Barriers/Preferences:  Learning Barriers/Preferences - 10/15/21 1412       Learning Barriers/Preferences   Learning Barriers Sight    Learning Preferences Skilled Demonstration             Education Topics:  Knowledge Questionnaire Score:  Knowledge Questionnaire Score - 10/15/21 1409       Knowledge Questionnaire Score   Pre Score 22/24             Core Components/Risk Factors/Patient Goals at Admission:  Personal Goals and Risk Factors at Admission - 10/15/21 1410       Core Components/Risk Factors/Patient Goals on Admission    Weight Management Yes;Obesity;Weight Loss    Intervention Weight Management: Develop a combined nutrition and exercise program designed to reach desired caloric intake, while maintaining appropriate intake of nutrient and fiber, sodium and fats, and appropriate energy expenditure required for the weight goal.;Weight Management: Provide education and appropriate resources to help participant work on and attain dietary goals.;Weight  Management/Obesity: Establish reasonable short term and long term weight goals.;Obesity: Provide education and appropriate resources to help participant work on and attain dietary goals.    Admit Weight 230 lb 13.2 oz (104.7 kg)    Expected Outcomes Long Term: Adherence to nutrition and physical activity/exercise program aimed toward attainment of established weight goal;Short Term: Continue to assess and modify interventions until short term weight is achieved;Weight Maintenance: Understanding of the daily nutrition guidelines, which includes 25-35% calories from fat, 7% or less cal from saturated fats, less than '200mg'$  cholesterol, less than 1.5gm of sodium, & 5 or more servings of fruits and vegetables daily;Weight Loss: Understanding of general recommendations for a balanced deficit meal plan, which promotes 1-2 lb weight loss per week and includes a negative energy balance of [id number] kcal/d;Understanding recommendations for meals to include 15-35% energy as protein, 25-35% energy from fat, 35-60% energy from carbohydrates, less than '200mg'$  of dietary cholesterol, 20-35 gm of total fiber daily;Understanding of distribution of calorie intake throughout the day with the consumption of 4-5 meals/snacks    Improve shortness of breath with ADL's Yes    Intervention Provide education, individualized exercise plan and daily activity instruction to help decrease symptoms of SOB with activities of daily living.    Expected Outcomes Short Term: Improve cardiorespiratory fitness to achieve a reduction of symptoms when performing ADLs;Long Term: Be able to perform more ADLs without symptoms or delay the onset of symptoms    Diabetes Yes    Intervention Provide education about signs/symptoms and action to  take for hypo/hyperglycemia.;Provide education about proper nutrition, including hydration, and aerobic/resistive exercise prescription along with prescribed medications to achieve blood glucose in normal ranges:  Fasting glucose 65-99 mg/dL    Expected Outcomes Short Term: Participant verbalizes understanding of the signs/symptoms and immediate care of hyper/hypoglycemia, proper foot care and importance of medication, aerobic/resistive exercise and nutrition plan for blood glucose control.;Long Term: Attainment of HbA1C < 7%.    Heart Failure Yes    Intervention Provide a combined exercise and nutrition program that is supplemented with education, support and counseling about heart failure. Directed toward relieving symptoms such as shortness of breath, decreased exercise tolerance, and extremity edema.    Expected Outcomes Improve functional capacity of life;Short term: Attendance in program 2-3 days a week with increased exercise capacity. Reported lower sodium intake. Reported increased fruit and vegetable intake. Reports medication compliance.;Short term: Daily weights obtained and reported for increase. Utilizing diuretic protocols set by physician.;Long term: Adoption of self-care skills and reduction of barriers for early signs and symptoms recognition and intervention leading to self-care maintenance.    Hypertension Yes    Intervention Provide education on lifestyle modifcations including regular physical activity/exercise, weight management, moderate sodium restriction and increased consumption of fresh fruit, vegetables, and low fat dairy, alcohol moderation, and smoking cessation.;Monitor prescription use compliance.    Expected Outcomes Short Term: Continued assessment and intervention until BP is < 140/58m HG in hypertensive participants. < 130/852mHG in hypertensive participants with diabetes, heart failure or chronic kidney disease.;Long Term: Maintenance of blood pressure at goal levels.    Lipids Yes    Intervention Provide education and support for participant on nutrition & aerobic/resistive exercise along with prescribed medications to achieve LDL '70mg'$ , HDL >'40mg'$ .    Expected Outcomes Short  Term: Participant states understanding of desired cholesterol values and is compliant with medications prescribed. Participant is following exercise prescription and nutrition guidelines.;Long Term: Cholesterol controlled with medications as prescribed, with individualized exercise RX and with personalized nutrition plan. Value goals: LDL < '70mg'$ , HDL > 40 mg.    Stress Yes    Intervention Offer individual and/or small group education and counseling on adjustment to heart disease, stress management and health-related lifestyle change. Teach and support self-help strategies.;Refer participants experiencing significant psychosocial distress to appropriate mental health specialists for further evaluation and treatment. When possible, include family members and significant others in education/counseling sessions.    Expected Outcomes Short Term: Participant demonstrates changes in health-related behavior, relaxation and other stress management skills, ability to obtain effective social support, and compliance with psychotropic medications if prescribed.;Long Term: Emotional wellbeing is indicated by absence of clinically significant psychosocial distress or social isolation.             Core Components/Risk Factors/Patient Goals Review:   Goals and Risk Factor Review     Row Name 10/20/21 0923             Core Components/Risk Factors/Patient Goals Review   Personal Goals Review Weight Management/Obesity;Lipids;Stress;Diabetes;Improve shortness of breath with ADL's;Hypertension       Review [name] on 10/19/21. [name] [name] with exercise. Vital signs and CBG's were stable. New medication addtion noted. Kella exercise with 2l/min of oxygen did not report chest pain.       Expected Outcomes [name] [name] continue to participate in [facility] for exercise, nutrition and lifestyle modifications                Core Components/Risk Factors/Patient Goals  at Discharge (Final Review):   Goals and Risk Factor Review - 10/20/21 0923       Core Components/Risk Factors/Patient Goals Review   Personal Goals Review Weight Management/Obesity;Lipids;Stress;Diabetes;Improve shortness of breath with ADL's;Hypertension    Review [name] started [name] on 10/19/21. [name] [name] did [name] with exercise. Vital signs and CBG's were stable. New medication addtion noted. [name] [name] exercise with 2l/min of oxygen did not report chest pain.    Expected Outcomes [name] [name] will continue to participate in [facility] cardiac rehab for exercise, nutrition and lifestyle modifications             ITP Comments:  ITP Comments     Row Name 10/15/21 1519 10/20/21 0909         ITP Comments Dr Fransico Him Medical Director, Introduction to [facility] Education Program/ [name]. Initial Orientation packet reviewed with the patient. 30 Day ITP Review. [name] started [name] on 10/19/21. [name] [name] did fair with exercise as she is deconditoned. [name] [name] exercsed on 2l/min of oxygen and took frequent rest breaks               Comments: See ITP comments. Sheryl did report some nausea initially due her ozempic dose being recently increased. CBG's were stable. [name] Gave RN BSN

## 2021-10-21 ENCOUNTER — Encounter (HOSPITAL_COMMUNITY)
Admission: RE | Admit: 2021-10-21 | Discharge: 2021-10-21 | Disposition: A | Discharge status: Home / Self Care | Payer: Medicare Other | Source: Ambulatory Visit | Attending: Cardiology | Admitting: Cardiology

## 2021-10-21 DIAGNOSIS — Z9861 Coronary angioplasty status: Secondary | ICD-10-CM | POA: Diagnosis not present

## 2021-10-21 LAB — GLUCOSE, CAPILLARY
Glucose-Capillary: 120 mg/dL — ABNORMAL HIGH (ref 70–99)
Glucose-Capillary: 121 mg/dL — ABNORMAL HIGH (ref 70–99)

## 2021-10-23 ENCOUNTER — Encounter (HOSPITAL_COMMUNITY): Payer: Medicare Other

## 2021-10-26 ENCOUNTER — Encounter (HOSPITAL_COMMUNITY)
Admission: RE | Admit: 2021-10-26 | Discharge: 2021-10-26 | Disposition: A | Discharge status: Home / Self Care | Payer: Medicare Other | Source: Ambulatory Visit | Attending: Cardiology | Admitting: Cardiology

## 2021-10-26 DIAGNOSIS — Z9861 Coronary angioplasty status: Secondary | ICD-10-CM | POA: Diagnosis not present

## 2021-10-27 ENCOUNTER — Ambulatory Visit (HOSPITAL_COMMUNITY)
Admission: RE | Admit: 2021-10-27 | Discharge: 2021-10-27 | Disposition: A | Discharge status: Home / Self Care | Payer: Medicare Other | Source: Ambulatory Visit | Attending: Family Medicine | Admitting: Family Medicine

## 2021-10-27 ENCOUNTER — Encounter (HOSPITAL_COMMUNITY): Payer: Self-pay

## 2021-10-27 VITALS — BP 142/70 | HR 56 | Wt 229.2 lb

## 2021-10-27 DIAGNOSIS — I5022 Chronic systolic (congestive) heart failure: Secondary | ICD-10-CM | POA: Insufficient documentation

## 2021-10-27 DIAGNOSIS — G4733 Obstructive sleep apnea (adult) (pediatric): Secondary | ICD-10-CM | POA: Insufficient documentation

## 2021-10-27 DIAGNOSIS — R002 Palpitations: Secondary | ICD-10-CM | POA: Insufficient documentation

## 2021-10-27 DIAGNOSIS — R053 Chronic cough: Secondary | ICD-10-CM | POA: Insufficient documentation

## 2021-10-27 DIAGNOSIS — R0789 Other chest pain: Secondary | ICD-10-CM | POA: Diagnosis not present

## 2021-10-27 DIAGNOSIS — Z6841 Body Mass Index (BMI) 40.0 and over, adult: Secondary | ICD-10-CM | POA: Insufficient documentation

## 2021-10-27 DIAGNOSIS — I130 Hypertensive heart and chronic kidney disease with heart failure and stage 1 through stage 4 chronic kidney disease, or unspecified chronic kidney disease: Secondary | ICD-10-CM | POA: Diagnosis not present

## 2021-10-27 DIAGNOSIS — E782 Mixed hyperlipidemia: Secondary | ICD-10-CM | POA: Insufficient documentation

## 2021-10-27 DIAGNOSIS — R001 Bradycardia, unspecified: Secondary | ICD-10-CM

## 2021-10-27 DIAGNOSIS — I25119 Atherosclerotic heart disease of native coronary artery with unspecified angina pectoris: Secondary | ICD-10-CM | POA: Insufficient documentation

## 2021-10-27 DIAGNOSIS — Z8673 Personal history of transient ischemic attack (TIA), and cerebral infarction without residual deficits: Secondary | ICD-10-CM | POA: Insufficient documentation

## 2021-10-27 DIAGNOSIS — Z794 Long term (current) use of insulin: Secondary | ICD-10-CM | POA: Diagnosis not present

## 2021-10-27 DIAGNOSIS — I340 Nonrheumatic mitral (valve) insufficiency: Secondary | ICD-10-CM | POA: Diagnosis not present

## 2021-10-27 DIAGNOSIS — Z79899 Other long term (current) drug therapy: Secondary | ICD-10-CM | POA: Diagnosis not present

## 2021-10-27 DIAGNOSIS — R0600 Dyspnea, unspecified: Secondary | ICD-10-CM | POA: Insufficient documentation

## 2021-10-27 DIAGNOSIS — I480 Paroxysmal atrial fibrillation: Secondary | ICD-10-CM | POA: Insufficient documentation

## 2021-10-27 DIAGNOSIS — Z7901 Long term (current) use of anticoagulants: Secondary | ICD-10-CM | POA: Insufficient documentation

## 2021-10-27 DIAGNOSIS — I350 Nonrheumatic aortic (valve) stenosis: Secondary | ICD-10-CM | POA: Diagnosis not present

## 2021-10-27 DIAGNOSIS — Z7902 Long term (current) use of antithrombotics/antiplatelets: Secondary | ICD-10-CM | POA: Diagnosis not present

## 2021-10-27 DIAGNOSIS — Z951 Presence of aortocoronary bypass graft: Secondary | ICD-10-CM | POA: Diagnosis not present

## 2021-10-27 DIAGNOSIS — I10 Essential (primary) hypertension: Secondary | ICD-10-CM | POA: Diagnosis not present

## 2021-10-27 DIAGNOSIS — N189 Chronic kidney disease, unspecified: Secondary | ICD-10-CM | POA: Insufficient documentation

## 2021-10-27 DIAGNOSIS — E1122 Type 2 diabetes mellitus with diabetic chronic kidney disease: Secondary | ICD-10-CM | POA: Diagnosis not present

## 2021-10-27 LAB — BASIC METABOLIC PANEL
Anion gap: 8 (ref 5–15)
BUN: 18 mg/dL (ref 8–23)
CO2: 24 mmol/L (ref 22–32)
Calcium: 9.2 mg/dL (ref 8.9–10.3)
Chloride: 109 mmol/L (ref 98–111)
Creatinine, Ser: 0.79 mg/dL (ref 0.44–1.00)
GFR, Estimated: >60 mL/min (ref >60)
Glucose, Bld: 131 mg/dL — ABNORMAL HIGH (ref 70–99)
Potassium: 4.3 mmol/L (ref 3.5–5.1)
Sodium: 141 mmol/L (ref 135–145)

## 2021-10-27 MED ORDER — NITROGLYCERIN 0.3 MG SL SUBL: 0.3000 mg | SUBLINGUAL_TABLET | SUBLINGUAL | 1 refills | Status: DC | PRN

## 2021-10-27 NOTE — Progress Notes (Addendum)
Date:  10/27/2021   ID:  [name] [name], DOB Jan 22, 1946, [name] [id number]   Provider location: [facility] Type of Visit: Established patient   PCP:  Derinda Late, MD  HF Cardiologist:  Dr. [name]  Chief Complaint: Follow up for CAD and CHF.   History of Present Illness: [name] [name] is a 76 y.o. female who has a history of CAD s/p CABG, diastolic CHF, and cerebrovascular disease with history of CVA presents for followup of CHF and diastolic CHF. She had CABG x 5 in 12/11.  Prior to the CABG she had multiple PCIs.  She had a CVA in 2/14 that presented as visual loss.  She had a left carotid stent in 2/15.    Left heart cath done in Sept. 2014. This showed patent SVG-D, LIMA-LAD, and sequential SVG-OM branches but SVG-RCA and the native RCA were both totally occluded. It was felt aht her increased symptoms coincided with occlusion of SVG-RCA.  She was started on Imdur to see if this would help with the chest pain and dyspnea.  However, she feels like Imdur causes leg cramps and does not think that she can take it.  So ranolazine 500 mg bid started and titrated this up to 1000 mg bid.  This helped some but not markedly. Therefore, sent to  Dr. Irish Lack to address opening RCA CTO. He was able to do this in 5/15 with 4 overlapping Xience DES.  This led to resolution of exertional chest pain.     [name] [name] was started on Brilinta after PCI.  She became much more short of breath after starting on Brilinta. Per Dr Delano Metz stopped and replaced with Plavix.  Dyspnea improved off Brilinta. Given increasing exertional dyspnea and chest pain,  RHC/LHC was done in 4/17.  This showed stable CAD with no interventional target.  Left and right heart filling pressures were not significantly increased.  Medical management.    She had an MRI/MRA in May 2018 that showed moderately severe stenosis of the supraclinoid segment of the right internal carotid artery, progressed since  the 2016 MR angiogram and severe basilar artery stenosis.    She developed recurrent exertional chest pain and had LHC in 6/18, showing 4/5 grafts patent with patent native RCA (similar to prior cath, no changes).     Echo 1/19 with EF 55-60%, moderate diastolic dysfunction, PASP 50 mmHg, mildly dilated RV, mild AS, mild-moderate MR.    She reported increased dyspnea and chest pain and had RHC/LHC in 9/19.  She had DES to sequential SVG-OM1/PLOM.  While hospitalized, she was noted to have runs of atrial fibrillation.  She was very symptomatic with the atrial fibrillation.  Eliquis and amiodarone were started.    She was admitted 12/16-12/19/19 for cardiomems implant and RHC. She was diuresed with IV lasix and transitioned to torsemide 100 mg BID + metolazone once/week. DC weight: 295 lbs.   She had a prolonged hospitalization in 2/20 with acute respiratory failure and fever to 103.  She was thought to have PNA and treated with cefepime.  She was intubated.  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  Amiodarone was stopped and she was put on prednisone. Echo in 2/20 showed EF down to 25-30% with mid-apical LV severe hypokinesis. She had AKI. Possible ITP triggered by infection, HIT negative.  ITP was treated with Solumedrol. She was discharged to SNF.    ORIF left ankle 4/20.  11/20 admission initially with concern for TIAs (staring spells), ended up thinking possible partial seizures.  Head MRI did not show CVA.  Echo in 11/20 showed EF 45-50%, moderately decreased RV systolic function, moderate RV enlargement, moderate-severe MR, moderate TR.   Patient was admitted again in 12/20 with AKI and hypotension in the setting of sinus bradycardia (HR 30s).  She was volume overloaded on exam.  Toprol XL was stopped and HR increased to 60s.  She was diuresed with IV Lasix.  TEE in 12/20 showed EF 50-55%, septal-lateral dyssynchrony, mildly decreased RV function, moderate MR.    Atypical chest pain in 1/21, Cardiolite showed EF 57%, fixed inferior defect (likely artifact), no ischemia.   Echo in 5/22 showed EF 60-65%, moderate LVH, normal RV, PASP 37 mmHg, 2.5 x 2.1 cm calcified mass posterior mitral annulus with mild-moderate MR => ?severe MAC but more circumscribed and prominent than in the past, mild AS mean gradient 12 mmHg and AVA 1.83 cm^2.  Echo in 11/22 showed EF 55%, normal RV, mass posterior mitral annulus, moderate MR, mild AS, IVC normal.  Cardiac MRI was done in 1/23 to investigate posterior mitral annulus mass; study showed LV EF 48%, mild LV dilation, normal RV with EF 53%, extensive MAC is likely the source of the posterior mitral annulus mass, basal-mid inferior >50% subendocardial LGE suggestive of prior inferior MI.   Follow up 2/23 she had chest tightness x 4 weeks. Arranged for [facility] which showed in-stent restenosis of pRCA, felt source of her symptoms given occluded SVG-RCA. Underwent PTCA to pRCA. RHC with normal PCWP and RA pressure, mild pulmonary hypertension, preserved CO. On ASA + Plavix + Eliquis x 1 month (until 05/21/21), then Plavix + Eliquis.   Follow up 8/23, continued with palpitations and 14 day Zio placed, showing no atrial fibrillation or worrisome arrhythmias.  CXR arranged due to chronic cough, showing no active cardiopulmonary disease.  Today she returns for HF follow up. Overall feeling fair. She has chronic exertional dyspnea. She is going to cardiac rehab twice a week. She continues to feel palpitations occasionally and occasional atypical chest pain. Denies abnormal bleeding, dizziness, edema, or PND/Orthopnea. Appetite ok. No fever or chills. Weight at home 228 pounds. Taking all medications. Has not started Ozempic. Has not been able to talk to CardioMems support about her pillow. Continues with chronic cough.   Cardiomems: PADP 7 on 09/01/21, most recent transmission attempts have not gone through.   REDs clip: 30%  ECG  (personally reviewed): NSR, LBBB 67 bpm  Labs (6/14): K 3.8, creatinine 0.71 Labs (8/14): BNP 249, LDL 166 Labs (9/14): K 4.7, creatinine 0.7 Labs (9/14): K 4.6, creatinine 0.9, BNP 109 Labs (1/15): K 4.5, creatinine 0.73, LDL 212, HCT 43.4, TSH normal, BNP 138 Labs (6/15): K 4.7, creatinine 1.17, LDL 100, HDL 37, TGs 363, BNP 39 Labs (10/15): K 4.6, creatinine 1.2 Labs (1/16): LDL 157, HDL 43, K 5.2, creatinine 0.95 Labs (2/16): K 4.5, creatinine 1.08, BNP 113 Labs (4/16): LDL 50, HDL 56, TGs 179 Labs (9/16): K 5.3, creatinine 1.09 Labs (10/16): LDL 75, HDL 56 Labs (2/17): K 5.2, creatinine 1.07 Labs (4/17): K 5.1, creatinine 1.07 Labs (5/17): K 4.5, creatinine 1.01, LDL 96, HDL 56, BNP 70 Labs (03/2016) K 4.9, creatinine 1.07 Labs (7/18): K 4.9, creatinine 1.15 Labs (11/18): K 5.1, creatinine 1.11, LDL 36 Labs (1/19): K 4.9, creatinine 1.22 Labs (9/19): LDL 108 Labs (10/19): hgb 14.5, K 4.3, creatinine 1.03 Labs 12/06/2017: K 4.7 Creatinine 1.7  Labs (3/20): K 4.8, creatinine 1.39, hgb 12.4 plts 226 Labs (11/20): K 4.2, creatinine 1.01, LDL 38 Labs (12/20): K 3.5, creatinine 1.8 Labs (3/21): K 4.5, creatinine 1.5 Labs (2/22): K 4.2, creatinine 1.13 Labs (4/22): LDL 58 Labs (9/22): K 4.3, creatinine 1.27 Labs (12/22): K 4.4, creatinine 1.13 Labs (3/23): K 4.3, creatinine 1.02 Labs (5/23): K 4.3, creatinine 1.33 Labs (6/23): BNP 140, K 4.5, creatinine 0.98   PMH: 1. Diabetic gastroparesis 2. Type II diabetes 3. HTN 4. Morbid obesity 5. CAD: s/p CABG in 12/11 after prior PCIs.  LIMA-LAD, SVG-D, seq SVG-OM1 and OM2, SVG-PDA. Adenosine Cardiolite (8/14) with EF 53% and a small reversible apical defect with a medium, partially reversible inferior defect.  LHC (9/14) with patent SVG-D, LIMA-LAD (50% distal LAD), and sequential SVG-OM branches; the SVG-RCA and the native RCA were occluded.  This was managed medically initially, but with ongoing exertional chest pain, it was decided to  open CTO.  Patient had CTO opening with 4 overlapping Xience DES in the RCA in 5/15.   - LHC (4/17): SVG-D patent, LIMA-LAD patent, distal LAD with several 40-50% stenoses, sequential SVG-OM1 and PLOM patent with 50% proximal stenosis (not flow limiting), SVG-RCA TO with patent RCA stents.  - LHC (6/18): 4/5 grafts patent (SVG-RCA TO, same as past), RCA stents patent => no change.  - LHC (9/19): Sequential SVG-PLOM/OM1 with 80% proximal stenosis, s/p DES.  - Cardiolite (1/21): EF 57%, fixed inferior defect (likely artifact), no ischemia.  - LHC (3/23): severe CAD with 90% in-stent restenosis of pRCA, s/p PTCA to in-stent restenosis of pRCA 6. Atypical migraines 7. OHS/OSA: Intolerant of CPAP. Uses oxygen with exertion and at night.  8. GERD with hiatal hernia 9. OA 10. HFPEF: She has a Cardiomems device.  Echo (11/13) with EF 50-55%, grade II diastolic dysfunction, mild-moderate MR.  Echo (8/14) with EF 55-60%, grade II diastolic dysfunction, mildly increased aortic valve gradient (mean 12 mmHg) but valve opens well, mild MR and mild RV dilation.  Echo (9/15) with EF 60-65%, grade II diastolic dysfunction, mild aortic stenosis, mild mitral stenosis, mild to moderate mitral regurgitation, mildly dilated RV with normal systolic function, PA systolic pressure 46 mmHg.  - RHC (4/17): mean RA 9, PA 38/15 mean 27, mean PCWP 9, CI 2.5.  - Echo (5/17): EF 55-60%, mild LVH, mildly dilated RV with low normal systolic function, moderate TR, PASP 61 mmHg - Echo (1/19): EF 55-60%, moderate diastolic dysfunction, PASP 50 mmHg, mildly dilated RV, mild AS, mild-moderate MR. - RHC (9/19): mean RA 11, PA 34/12, mean PCWP 15, CI 2.85 - Echo (3/20): EF 25-30% with mid-apical severe hypokinesis.  - Echo (11/20): EF 45-50%, moderately dilated RV with moderately decreased systolic function, moderate-severe MR, moderate TR.  - TEE (12/20): EF 50-55%, septal-lateral dyssynchrony, moderate RV dilation/mildly decreased  function, peak RV-RA gradient 51 mmHg, moderate MR.  - Echo (5/22): EF 60-65%, moderate LVH, normal RV, PASP 37 mmHg, 2.5 x 2.1 cm calcified mass posterior mitral annulus with mild-moderate MR => ?severe MAC but more circumscribed and prominent than in the past, mild AS mean gradient 12 mmHg and AVA 1.83 cm^2.  - Echo (11/22): EF 55%, normal RV, mass posterior mitral annulus, moderate MR, mild AS, IVC normal. - Cardiac MRI in 1/23 showed LV EF 48%, mild LV dilation, normal RV with EF 53%, extensive MAC is likely the source of the posterior mitral annulus mass, basal-mid inferior >50% subendocardial LGE suggestive of prior inferior MI.  - RHC (  3/23): normal PCWP (14) and RA pressure (2) , mild pulmonary hypertension, preserved CO/CI (6.51/3.28). 11. CKD 12. Chronic LBBB 13. Anxiety 14. Carotid stenosis: Followed by [facility], >17% LICA stenosis 49/44.  She had left carotid stent in 2/15. Carotid dopplers 8/15 with no significant disease. Carotid dopplers (4/16): < 40% RICA stenosis.  - Carotid dopplers (9/22): 1-39% BICA stenosis.  15. Cerebrovascular disease: CVA 2/14 with right posterior cerebral artery territory ischemic infarction. Cerebral angiogram in 6/14 showed 70% right vertebral ostial stenosis, 96-75% LICA stenosis, > 91% proximal left posterior cerebral artery stenosis, posterior communicating artery aneurysm.  Patient has had episodes of transient expressive aphasia.  Carotid dopplers (63/84) showed >66% LICA stenosis. She had a left carotid stent 2/15.  Possible CVA in 7/15. -  MRI/MRA in May 2018 that showed moderately severe stenosis of the supraclinoid segment of the right internal carotid artery, progressed since the 2016 MR angiogram and severe basilar artery stenosis.  16. Positional vertigo (suspected) 17. Palpitations: Holter (6/15) with rare PVCs/PACs.  - Event monitor (2/18): NSR, occasional PVCs - 14 day Zio (9/23) mostly NSR, rare PVCs and PACs, few short runs of SVT, no atrial  fibrillation or worrisome arrhythmias. 18. Dyspnea with Brilinta. 19. Melanoma: On face, s/p excision.   20. Aortic stenosis: Mild on 11/22 echo.  21. Holter (8/16) with no significant arrhythmias.  22. Lower extremity arterial dopplers (9/16) were normal.  23. Sleep study (4/18): No significant OSA.  24. Atrial fibrillation: Paroxysmal - Amiodarone stopped, ?lung toxicity.   25. H/o ITP in 3/20.  16. Partial seizures 17. Mitral regurgitation: Moderate on TEE in 12/20.  - Mild to moderate on 5/22 echo.  - Moderate on 11/22 echo 18. Posterior mitral annular mass: Cardiac MRI was done in 1/23 to investigate posterior mitral annulus mass; study showed LV EF 48%, mild LV dilation, normal RV with EF 53%, extensive MAC is likely the source of the posterior mitral annulus mass, basal-mid inferior >50% subendocardial LGE suggestive of prior inferior MI.   Current Outpatient Medications  Medication Sig Dispense Refill   acetaminophen (TYLENOL) 500 MG tablet Take 500 mg by mouth every 6 (six) hours as needed for mild pain, moderate pain, fever or headache.     albuterol (VENTOLIN HFA) 108 (90 Base) MCG/ACT inhaler Inhale 2 puffs into the lungs every 4 (four) hours as needed for wheezing or shortness of breath.      Alirocumab (PRALUENT) 150 MG/ML SOAJ Inject 150 mg into the skin every 14 (fourteen) days. 6 mL 3   ALPRAZolam (XANAX) 0.5 MG tablet Take 1-2 tablets (0.5-1 mg total) by mouth See admin instructions. Take one tablet (0.5 mg) by mouth every morning and two tablets (1 mg) at night, may also take 1 tablet (0.5 mg) midday as needed for anxiety     amLODipine (NORVASC) 10 MG tablet Take 10 mg by mouth at bedtime.     apixaban (ELIQUIS) 5 MG TABS tablet Take 1 tablet (5 mg total) by mouth 2 (two) times daily. 180 tablet 1   B Complex-C (B-COMPLEX WITH VITAMIN C) tablet Take 1 tablet by mouth in the morning.     bisacodyl (DULCOLAX) 5 MG EC tablet Take 5 mg by mouth daily as needed for moderate  constipation.     buPROPion (WELLBUTRIN XL) 150 MG 24 hr tablet Take 150 mg by mouth every morning.     carboxymethylcellulose (REFRESH PLUS) 0.5 % SOLN 1 drop in the morning, at noon, and at bedtime.  cholecalciferol (VITAMIN D3) 25 MCG (1000 UNIT) tablet Take 1,000 Units by mouth in the morning.     clopidogrel (PLAVIX) 75 MG tablet Take 75 mg by mouth every morning.     denosumab (PROLIA) 60 MG/ML SOSY injection Inject 60 mg into the skin every 6 (six) months.     DULoxetine (CYMBALTA) 30 MG capsule Take 1 capsule (30 mg total) by mouth daily. 30 capsule 0   ezetimibe (ZETIA) 10 MG tablet Take 1 tablet (10 mg total) by mouth at bedtime. 90 tablet 3   fluocinonide cream (LIDEX) 5.73 % Apply 1 application. topically 2 (two) times daily as needed (irritation).     Fluticasone-Salmeterol (ADVAIR) 250-50 MCG/DOSE AEPB Inhale 1 puff into the lungs daily.     gabapentin (NEURONTIN) 600 MG tablet Take 600-1,200 mg by mouth See admin instructions. Take 1 tablet (600 mg) by mouth in the morning & take 2 tablets  (1200 mg) by mouth at bedtime     Insulin Glargine (LANTUS SOLOSTAR) 100 UNIT/ML Solostar Pen Inject 28-44 Units into the skin at bedtime. Per sliding scale     insulin lispro (HUMALOG) 100 UNIT/ML KwikPen Inject 14-20 Units into the skin See admin instructions. Inject 14 units subcutaneously prior to breakfast and supper; add 4 units for CBG >200     ipratropium-albuterol (DUONEB) 0.5-2.5 (3) MG/3ML SOLN Take 3 mLs by nebulization 2 (two) times daily as needed (asthma).     loperamide (IMODIUM A-D) 2 MG tablet Take 2 mg by mouth 4 (four) times daily as needed for diarrhea or loose stools.     methocarbamol (ROBAXIN) 500 MG tablet Take 500 mg by mouth See admin instructions. Take 1 tablet (500 mg) by mouth scheduled at bedtime & may take 2 additional doses during the day if needed for back pain.     Multiple Vitamin (MULTIVITAMIN WITH MINERALS) TABS tablet Take 1 tablet by mouth every morning.  Centrum - Women over 55     Multiple Vitamins-Minerals (OCUVITE EYE HEALTH FORMULA) CAPS Take 1 capsule by mouth every morning.     nitroGLYCERIN (NITROSTAT) 0.3 MG SL tablet Place 1 tablet (0.3 mg total) under the tongue every 5 (five) minutes x 3 doses as needed for chest pain. 25 tablet 1   ondansetron (ZOFRAN-ODT) 4 MG disintegrating tablet Take 4 mg by mouth 2 (two) times daily as needed for nausea or vomiting.     OXYGEN Inhale 2 L into the lungs at bedtime as needed (shortness of breath).      pantoprazole (PROTONIX) 40 MG tablet Take 1 tablet (40 mg total) by mouth daily. 30 tablet 0   potassium chloride SA (KLOR-CON) 20 MEQ tablet Take 1 tablet (20 mEq total) by mouth daily. 90 tablet 3   Pyridoxine HCl (VITAMIN B-6 PO) Take 1 tablet by mouth every morning.     ranolazine (RANEXA) 1000 MG SR tablet TAKE ONE TABLET BY MOUTH TWICE DAILY 180 tablet 3   rosuvastatin (CRESTOR) 40 MG tablet Take 1 tablet (40 mg total) by mouth daily. (Patient taking differently: Take 40 mg by mouth at bedtime.) 90 tablet 3   sacubitril-valsartan (ENTRESTO) 97-103 MG Take 1 tablet by mouth 2 (two) times daily. 180 tablet 3   Semaglutide,0.25 or 0.5MG/DOS, 2 MG/3ML SOPN Inject 0.75m SQ once weekly x 4 weeks, then increase to 0.559mSQ once weekly (Patient taking differently: every Wednesday. Inject 0.2567mQ once weekly x 4 weeks, then increase to 0.5mg25m once weekly) 3 mL 1  torsemide (DEMADEX) 20 MG tablet Take 0.5 tablets (10 mg total) by mouth daily. 15 tablet 2   traZODone (DESYREL) 50 MG tablet Take 1 tablet (50 mg total) by mouth at bedtime. 90 tablet 1   vitamin B-12 (CYANOCOBALAMIN) 1000 MCG tablet Take 1,000 mcg by mouth every morning.      zolpidem (AMBIEN) 10 MG tablet Take 10 mg by mouth at bedtime.     No current facility-administered medications for this encounter.    Allergies:   Amoxicillin, Brilinta [ticagrelor], Erythromycin, Flagyl [metronidazole], Penicillins, Isosorbide mononitrate  [isosorbide nitrate], Jardiance [empagliflozin], Metformin and related, and Tape   Social History:  The patient  reports that she has never smoked. She has never used smokeless tobacco. She reports that she does not drink alcohol and does not use drugs.   Family History:  The patient's family history includes AAA (abdominal aortic aneurysm) in her father and mother; Cancer in her sister; Deep vein thrombosis in her father and mother; Dementia in her maternal grandmother and mother; Diabetes in her father, paternal aunt, paternal grandmother, paternal uncle, and paternal uncle; Heart attack in her father; Heart attack (age of onset: 16) in her paternal grandfather; Heart disease in her father and paternal uncle; Hyperlipidemia in her father; Hypertension in her father, mother, and sister.   ROS:  Please see the history of present illness.   All other systems are personally reviewed and negative.   Recent Labs: 06/02/2021: Hemoglobin 14.8; Platelets 173 09/15/2021: B Natriuretic Peptide 195.9 09/25/2021: BUN 18; Creatinine, Ser 0.99; Potassium 4.6; Sodium 140  Personally reviewed   Wt Readings from Last 3 Encounters:  10/27/21 104 kg (229 lb 3.2 oz)  10/15/21 104.7 kg (230 lb 14.4 oz)  09/17/21 104.8 kg (231 lb)    BP (!) 142/70   Pulse (!) 56   Wt 104 kg (229 lb 3.2 oz)   SpO2 96%   BMI 42.26 kg/m   Physical Exam:   General:  NAD. No resp difficulty, walked into clinic with RW HEENT: Normal Neck: Supple. No JVD. Carotids 2+ bilat; no bruits. No lymphadenopathy or thryomegaly appreciated. Cor: PMI nondisplaced. Regular rate & rhythm. No rubs, gallops or murmurs. Lungs: Clear Abdomen: Obse, soft, nontender, nondistended. No hepatosplenomegaly. No bruits or masses. Good bowel sounds. Extremities: No cyanosis, clubbing, rash, edema Neuro: Alert & oriented x 3, cranial nerves grossly intact. Moves all 4 extremities w/o difficulty. Affect pleasant.  Assessment & Plan: 1. CAD: Occluded  native RCA and SVG-RCA.  Status post opening of CTO RCA with 4 overlapping Xience DES in 5/15.  DES to sequential SVG-OM1/PLOM in 9/19. LHC in 3/23 showed severe native coronary disease with 90% in-stent restenosis in the proximal RCA (patient has 2 layers of stent at this site). The sequential SVG-OM1/PLOM was patent, SVG-D was patent, and LIMA-LAD was patent. She underwent PTCA of pRCA. She continues to have occasional atypical chest pain.  - Continue apixaban and Plavix (has been on chronic Plavix, neuro has wanted her to stay on this for cerebrovascular disease).   - Off Imdur with headache. - Off Toprol XL with bradycardia.   - Continue ranolazine 1000 mg bid and amlodipine 10 mg daily for angina.  - Continue Cardiac Rehab.  2. Chronic systolic => diastolic CHF: Echo in 7/65 with EF 55-60%, moderate diastolic dysfunction. Cardiomems placed 01/16/18.  Echo in 3/20 in setting of severe PNA with intubation showed EF 25-30%, mid-apical LV severe hypokinesis.  Possible stress (Takotsubo-type) cardiomyopathy related to severe medical illness  versus progression of CAD.  She has baseline chronic LBBB which could play a role in cardiomyopathy as well (LBBB cardiomyopathy).  Repeat echo in 11/20 with EF up to 45-50%, moderately dysfunctional RV.  TEE in 12/20 with EF 50-55% with septal-lateral dyssynchrony and mildly dysfunctional RV.  Most recent echo in 11/22 with EF 55% with normal RV.  cMRI in 1/23 with EF 48% and evidence for prior inferior MI.  NYHA class III symptoms, she did not improve post-PTCA RCA.   She does not appear volume overloaded on exam, or by REDS 30%.  She has been having trouble sending Cardiomems transmissions. - Stop KCL. - Continue spiro 25 mg daily. BMET today, repeat in 4 weeks. - Continue torsemide 10 mg daily. - I asked her to call Cardiomems customer support to see if they can help troubleshoot her connectivity issues with her pillow. We were able to optimize her fluid levels well  when she was sending regular readings. I gave her the # to customer support today. - Continue Entresto 97/103 mg bid.  - Cannot tolerate SGLT2i. 3. Hyperlipidemia: She remains on Zetia, Crestor and Praluent. Followed in lipid clinic.  4. Hypertension: BP controlled.  5. Cerebrovascular disease: She has history of CVA and had left carotid stent in 2/15. Followed by [facility].  Most recent MRA head showed severe stenosis of supraclinoid RICA and severe basilar artery stenosis.  - Neurology would like her to continue Plavix for this despite concomitant apixaban use.  6. OHS/OSA: Cannot tolerate CPAP.  Uses oxygen at night.  7. Atrial fibrillation: Paroxysmal. 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 She has episodes of palpitations that are bothersome but do not cause lightheadedness.  - Continue apixaban 5 mg bid. No bleeding issues. - 14 day Zio (9/23) showed no atrial fibrillation or worrisome arrhythmias. 8. Obesity: Body mass index is 42.26 kg/m. - She is now on Ozempic. 9. Mitral regurgitation: Moderate on 11/22 echo.  10. Bradycardia: Now off Toprol XL.   11. Aortic stenosis: Mild on 11/22 echo.  12. Mitral annular mass: This is a well-circumscribed calcified posterior mitral annulus mass noted on 5/22 and 11/22 echoes.  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 13. Chronic cough: Dry cough.  Protonix has not helped. Does not seem to be related to volume. Recent CXR stable. - She has follow up with PCP to address  Follow up in 4 months with Dr. [name].   Signed, Rafael Bihari, FNP  10/27/2021  [facility] [address] [facility] [place] [address] [phone] (office) [phone] (fax)

## 2021-10-27 NOTE — Addendum Note (Signed)
Encounter addended by: Rafael Bihari, FNP on: 10/27/2021 4:52 PM  Actions taken: Clinical Note Signed

## 2021-10-27 NOTE — Patient Instructions (Addendum)
STOP Potassium  Labs today We will only contact you if something comes back abnormal or we need to make some changes. Otherwise no news is good news!  Labs needed in one month  Please be sure to contact [place] team [phone]  Your physician recommends that you schedule a follow-up appointment in: 3-4 months with Dr [name]  Do the following things EVERYDAY: Weigh yourself in the morning before breakfast. Write it down and keep it in a log. Take your medicines as prescribed Eat low salt foods--Limit salt (sodium) to 2000 mg per day.  Stay as active as you can everyday Limit all fluids for the day to less than 2 liters   At the [facility], you and your health needs are our priority. As part of our continuing mission to provide you with exceptional heart care, we have created designated Provider Care Teams. These Care Teams include your primary Cardiologist (physician) and Advanced Practice Providers (APPs- Physician Assistants and Nurse Practitioners) who all work together to provide you with the care you need, when you need it.   You may see any of the following providers on your designated Care Team at your next follow up: Dr Glori Bickers Dr Loralie Champagne Dr. Roxana Hires, NP Lyda Jester, [place] [facility] [place], [place] Forestine Na, NP Audry Riles, PharmD   Please be sure to bring in all your medications bottles to every appointment.   If you have any questions or concerns before your next appointment please send [place] a message through [place] or call our office at [phone].    TO LEAVE A MESSAGE FOR THE NURSE SELECT OPTION 2, PLEASE LEAVE A MESSAGE INCLUDING: YOUR NAME DATE OF BIRTH CALL BACK NUMBER REASON FOR CALL**this is important as we prioritize the call backs  YOU WILL RECEIVE A CALL BACK THE SAME DAY AS LONG AS YOU CALL BEFORE 4:00 PM

## 2021-10-27 NOTE — Progress Notes (Signed)
ReDS Vest / Clip - 10/27/21 1100       ReDS Vest / Clip   Station Marker A    Ruler Value 31    ReDS Value Range Low volume    ReDS Actual Value 30    Anatomical Comments sitting

## 2021-10-27 NOTE — Addendum Note (Signed)
Encounter addended by: Rafael Bihari, FNP on: 10/27/2021 4:53 PM  Actions taken: Clinical Note Signed

## 2021-10-28 ENCOUNTER — Encounter (HOSPITAL_COMMUNITY)
Admission: RE | Admit: 2021-10-28 | Discharge: 2021-10-28 | Disposition: A | Discharge status: Home / Self Care | Payer: Medicare Other | Source: Ambulatory Visit | Attending: Cardiology | Admitting: Cardiology

## 2021-10-28 DIAGNOSIS — Z9861 Coronary angioplasty status: Secondary | ICD-10-CM

## 2021-10-30 ENCOUNTER — Encounter (HOSPITAL_COMMUNITY): Payer: Medicare Other

## 2021-11-02 ENCOUNTER — Encounter (HOSPITAL_COMMUNITY)
Admission: RE | Admit: 2021-11-02 | Discharge: 2021-11-02 | Disposition: A | Discharge status: Home / Self Care | Payer: Medicare Other | Source: Ambulatory Visit | Attending: Cardiology | Admitting: Cardiology

## 2021-11-02 DIAGNOSIS — Z9861 Coronary angioplasty status: Secondary | ICD-10-CM | POA: Diagnosis present

## 2021-11-04 ENCOUNTER — Encounter (HOSPITAL_COMMUNITY)
Admission: RE | Admit: 2021-11-04 | Discharge: 2021-11-04 | Disposition: A | Discharge status: Home / Self Care | Payer: Medicare Other | Source: Ambulatory Visit | Attending: Cardiology | Admitting: Cardiology

## 2021-11-04 DIAGNOSIS — Z9861 Coronary angioplasty status: Secondary | ICD-10-CM | POA: Diagnosis not present

## 2021-11-06 ENCOUNTER — Encounter (HOSPITAL_COMMUNITY): Payer: Medicare Other

## 2021-11-09 ENCOUNTER — Encounter (HOSPITAL_COMMUNITY): Payer: Medicare Other

## 2021-11-09 ENCOUNTER — Telehealth (HOSPITAL_COMMUNITY): Payer: Self-pay | Admitting: *Deleted

## 2021-11-09 ENCOUNTER — Telehealth (HOSPITAL_COMMUNITY): Payer: Self-pay | Admitting: Family Medicine

## 2021-11-09 ENCOUNTER — Other Ambulatory Visit (HOSPITAL_BASED_OUTPATIENT_CLINIC_OR_DEPARTMENT_OTHER): Payer: Self-pay | Admitting: Family Medicine

## 2021-11-09 DIAGNOSIS — W01111A Fall on same level from slipping, tripping and stumbling with subsequent striking against power tool or machine, initial encounter: Secondary | ICD-10-CM

## 2021-11-09 NOTE — Telephone Encounter (Signed)
Sterling said that she took a bad fall on Saturday. I asked Markesia to call and follow up with her primary care provider Dr Sandi Mariscal.Barnet Pall, RN,BSN 11/09/2021 1:18 PM

## 2021-11-10 ENCOUNTER — Ambulatory Visit (HOSPITAL_BASED_OUTPATIENT_CLINIC_OR_DEPARTMENT_OTHER)
Admission: RE | Admit: 2021-11-10 | Discharge: 2021-11-10 | Disposition: A | Discharge status: Home / Self Care | Payer: Medicare Other | Source: Ambulatory Visit | Attending: Family Medicine | Admitting: Family Medicine

## 2021-11-10 ENCOUNTER — Telehealth (HOSPITAL_COMMUNITY): Payer: Self-pay | Admitting: *Deleted

## 2021-11-10 DIAGNOSIS — S098XXA Other specified injuries of head, initial encounter: Secondary | ICD-10-CM | POA: Diagnosis not present

## 2021-11-10 DIAGNOSIS — W01111A Fall on same level from slipping, tripping and stumbling with subsequent striking against power tool or machine, initial encounter: Secondary | ICD-10-CM | POA: Diagnosis not present

## 2021-11-10 DIAGNOSIS — R519 Headache, unspecified: Secondary | ICD-10-CM | POA: Insufficient documentation

## 2021-11-10 DIAGNOSIS — T1490XA Injury, unspecified, initial encounter: Secondary | ICD-10-CM | POA: Diagnosis present

## 2021-11-10 NOTE — Progress Notes (Signed)
Cardiac Individual Treatment Plan  Patient Details  Name: [name] [name] [name]: [id number] Date of Birth: Jun 09, 1945 Referring Provider:   Flowsheet Row INTENSIVE CARDIAC REHAB ORIENT from 10/15/2021 in [place]  Referring Provider Loralie Champagne, MD       Initial Encounter Date:  [place] from 10/15/2021 in [facility]  Date 10/15/21       Visit Diagnosis: 04/20/21 S/P PTCA p RCA for ISR  Patient's Home Medications on Admission:  Current Outpatient Medications:    acetaminophen (TYLENOL) 500 MG tablet, Take 500 mg by mouth every 6 (six) hours as needed for mild pain, moderate pain, fever or headache., Disp: , Rfl:    albuterol (VENTOLIN HFA) 108 (90 Base) MCG/ACT inhaler, Inhale 2 puffs into the lungs every 4 (four) hours as needed for wheezing or shortness of breath. , Disp: , Rfl:    Alirocumab (PRALUENT) 150 MG/ML SOAJ, Inject 150 mg into the skin every 14 (fourteen) days., Disp: 6 mL, Rfl: 3   ALPRAZolam (XANAX) 0.5 MG tablet, Take 1-2 tablets (0.5-1 mg total) by mouth See admin instructions. Take one tablet (0.5 mg) by mouth every morning and two tablets (1 mg) at night, may also take 1 tablet (0.5 mg) midday as needed for anxiety, Disp: , Rfl:    amLODipine (NORVASC) 10 MG tablet, Take 10 mg by mouth at bedtime., Disp: , Rfl:    apixaban (ELIQUIS) 5 MG TABS tablet, Take 1 tablet (5 mg total) by mouth 2 (two) times daily., Disp: 180 tablet, Rfl: 1   B Complex-C (B-COMPLEX WITH VITAMIN C) tablet, Take 1 tablet by mouth in the morning., Disp: , Rfl:    bisacodyl (DULCOLAX) 5 MG EC tablet, Take 5 mg by mouth daily as needed for moderate constipation., Disp: , Rfl:    buPROPion (WELLBUTRIN XL) 150 MG 24 hr tablet, Take 150 mg by mouth every morning., Disp: , Rfl:    carboxymethylcellulose (REFRESH PLUS) 0.5 % SOLN, 1 drop in the morning, at noon, and at bedtime., Disp: , Rfl:     cholecalciferol (VITAMIN D3) 25 MCG (1000 UNIT) tablet, Take 1,000 Units by mouth in the morning., Disp: , Rfl:    clopidogrel (PLAVIX) 75 MG tablet, Take 75 mg by mouth every morning., Disp: , Rfl:    denosumab (PROLIA) 60 MG/ML SOSY injection, Inject 60 mg into the skin every 6 (six) months., Disp: , Rfl:    DULoxetine (CYMBALTA) 30 MG capsule, Take 1 capsule (30 mg total) by mouth daily., Disp: 30 capsule, Rfl: 0   ezetimibe (ZETIA) 10 MG tablet, Take 1 tablet (10 mg total) by mouth at bedtime., Disp: 90 tablet, Rfl: 3   fluocinonide cream (LIDEX) 0.93 %, Apply 1 application. topically 2 (two) times daily as needed (irritation)., Disp: , Rfl:    Fluticasone-Salmeterol (ADVAIR) 250-50 MCG/DOSE AEPB, Inhale 1 puff into the lungs daily., Disp: , Rfl:    gabapentin (NEURONTIN) 600 MG tablet, Take 600-1,200 mg by mouth See admin instructions. Take 1 tablet (600 mg) by mouth in the morning & take 2 tablets  (1200 mg) by mouth at bedtime, Disp: , Rfl:    Insulin Glargine (LANTUS SOLOSTAR) 100 UNIT/ML Solostar Pen, Inject 28-44 Units into the skin at bedtime. Per sliding scale, Disp: , Rfl:    insulin lispro (HUMALOG) 100 UNIT/ML KwikPen, Inject 14-20 Units into the skin See admin instructions. Inject 14 units subcutaneously prior to breakfast and supper; add  4 units for CBG >200, Disp: , Rfl:    ipratropium-albuterol (DUONEB) 0.5-2.5 (3) MG/3ML SOLN, Take 3 mLs by nebulization 2 (two) times daily as needed (asthma)., Disp: , Rfl:    loperamide (IMODIUM A-D) 2 MG tablet, Take 2 mg by mouth 4 (four) times daily as needed for diarrhea or loose stools., Disp: , Rfl:    methocarbamol (ROBAXIN) 500 MG tablet, Take 500 mg by mouth See admin instructions. Take 1 tablet (500 mg) by mouth scheduled at bedtime & may take 2 additional doses during the day if needed for back pain., Disp: , Rfl:    Multiple Vitamin (MULTIVITAMIN WITH MINERALS) TABS tablet, Take 1 tablet by mouth every morning. Centrum - Women over 9,  Disp: , Rfl:    Multiple Vitamins-Minerals ([place]) CAPS, Take 1 capsule by mouth every morning., Disp: , Rfl:    nitroGLYCERIN (NITROSTAT) 0.3 MG SL tablet, Place 1 tablet (0.3 mg total) under the tongue every 5 (five) minutes x 3 doses as needed for chest pain., Disp: 25 tablet, Rfl: 1   ondansetron (ZOFRAN-ODT) 4 MG disintegrating tablet, Take 4 mg by mouth 2 (two) times daily as needed for nausea or vomiting., Disp: , Rfl:    OXYGEN, Inhale 2 L into the lungs at bedtime as needed (shortness of breath). , Disp: , Rfl:    pantoprazole (PROTONIX) 40 MG tablet, Take 1 tablet (40 mg total) by mouth daily., Disp: 30 tablet, Rfl: 0   Pyridoxine HCl (VITAMIN B-6 PO), Take 1 tablet by mouth every morning., Disp: , Rfl:    ranolazine (RANEXA) 1000 MG SR tablet, TAKE ONE TABLET BY MOUTH TWICE DAILY, Disp: 180 tablet, Rfl: 3   rosuvastatin (CRESTOR) 40 MG tablet, Take 1 tablet (40 mg total) by mouth daily. (Patient taking differently: Take 40 mg by mouth at bedtime.), Disp: 90 tablet, Rfl: 3   sacubitril-valsartan (ENTRESTO) 97-103 MG, Take 1 tablet by mouth 2 (two) times daily., Disp: 180 tablet, Rfl: 3   Semaglutide,0.25 or 0.'5MG'$ /DOS, 2 MG/3ML SOPN, Inject 0.'25mg'$  SQ once weekly x 4 weeks, then increase to 0.'5mg'$  SQ once weekly (Patient taking differently: every Wednesday. Inject 0.'25mg'$  SQ once weekly x 4 weeks, then increase to 0.'5mg'$  SQ once weekly), Disp: 3 mL, Rfl: 1   spironolactone (ALDACTONE) 25 MG tablet, Take 25 mg by mouth daily., Disp: , Rfl:    torsemide (DEMADEX) 20 MG tablet, Take 0.5 tablets (10 mg total) by mouth daily., Disp: 15 tablet, Rfl: 2   traZODone (DESYREL) 50 MG tablet, Take 1 tablet (50 mg total) by mouth at bedtime., Disp: 90 tablet, Rfl: 1   vitamin B-12 (CYANOCOBALAMIN) 1000 MCG tablet, Take 1,000 mcg by mouth every morning. , Disp: , Rfl:    zolpidem (AMBIEN) 10 MG tablet, Take 10 mg by mouth at bedtime., Disp: , Rfl:   Past Medical History: Past Medical  History:  Diagnosis Date   Anemia    hx   Anxiety    Asthma    Basal cell carcinoma 05/2014   "left shoulder"   Bundle branch block, left    chronic/notes 07/18/2013   CHF (congestive heart failure) (HCC)    Chronic insomnia 05/06/2015   Chronic kidney disease    frequency, sees dr Jamal Maes every 4 to 6 months (01/16/2018)   Chronic lower back pain    Claustrophobia    Common migraine 05/14/2014   Coronary artery disease    MI in 2001, 2002, 2006, 2011, 2014  Depression    Diabetic peripheral neuropathy ([place]) 01/12/2019   GERD (gastroesophageal reflux disease)    H/O hiatal hernia    Headache    "at least 2/month" (01/16/2018)   Heart murmur    Hyperlipidemia    Hypertension    Memory change 05/14/2014   Migraine    "5-6/year"  (01/16/2018)   Obesity 01-2010   Obstructive sleep apnea    "can't wear machine; I have claustrophobia" (01/16/2018), states she had a 2nd sleep study and does not have sleep apnea, her O2 decreases and now is on 2 L of O2 at night.   On home oxygen therapy    "2L at night and prn during daytime" (01/16/2018)   Osteoarthritis    "knees and hands" (01/16/2018)   Peripheral vascular disease (HCC)    ? numbness, tingling arms and legs   PONV (postoperative nausea and vomiting)    Stroke ([place])    2014, 2015, 2016   Type II diabetes mellitus ([place])    Ventral hernia    hx of    Tobacco Use: Social History   Tobacco Use  Smoking Status Never  Smokeless Tobacco Never    Labs: Review Flowsheet  More data exists      Latest Ref Rng & Units 01/17/2019 06/06/2019 05/13/2020 03/31/2021 04/20/2021  Labs for ITP Cardiac and Pulmonary Rehab  Cholestrol 0 - 200 mg/dL - 131  121  173  -  LDL (calc) 0 - 99 mg/dL - 57  58  102  -  HDL-C >40 mg/dL - 57  51  58  -  Trlycerides <150 mg/dL - 83  58  65  -  Hemoglobin A1c 4.8 - 5.6 % 7.2  - - - -  Bicarbonate 20.0 - 28.0 mmol/L 20.0 - 28.0 mmol/L - - - - 27.2  27.5   TCO2 22 - 32 mmol/L 22 - 32  mmol/L - - - - 29  29   O2 Saturation % % - - - - 79  80     Capillary Blood Glucose: Lab Results  Component Value Date   GLUCAP 121 (H) 10/21/2021   GLUCAP 120 (H) 10/21/2021   GLUCAP 161 (H) 10/19/2021   GLUCAP 144 (H) 10/19/2021   GLUCAP 196 (H) 06/02/2021     Exercise Target Goals: Exercise Program Goal: Individual exercise prescription set using results from initial 6 min walk test and THRR while considering  patient's activity barriers and safety.   Exercise Prescription Goal: Initial exercise prescription builds to 30-45 minutes a day of aerobic activity, 2-3 days per week.  Home exercise guidelines will be given to patient during program as part of exercise prescription that the participant will acknowledge.  Activity Barriers & Risk Stratification:  Activity Barriers & Cardiac Risk Stratification - 10/15/21 1439       Activity Barriers & Cardiac Risk Stratification   Activity Barriers Arthritis;Back Problems;Neck/Spine Problems;Joint Problems;Deconditioning;Muscular Weakness;Shortness of Breath;Decreased Ventricular Function;Chest Pain/Angina;Balance Concerns;History of Falls;Assistive Device    Cardiac Risk Stratification High             6 Minute Walk:  6 Minute Walk     Row Name 10/15/21 1413         6 Minute Walk   Phase Initial  Perfomed on a Nustep     Distance 1115 feet     Walk Time 6 minutes     # of Rest Breaks 1  From 3:00 to 3:49     MPH 2.11  METS 1.3     RPE 11     Perceived Dyspnea  2     VO2 Peak 4.61     Symptoms Yes (comment)     Comments left knee pain 2/10, low back ppain 4/10, angina(chest pressure) 2/10     Resting HR 58 bpm     Resting BP 149/80     Resting Oxygen Saturation  96 %     Exercise Oxygen Saturation  during 6 min walk 95 %     Max Ex. HR 57 bpm     Max Ex. BP 170/74     2 Minute Post BP 154/80  Exit BP was 118/80       Interval Oxygen   Interval Oxygen? Yes     Baseline Oxygen Saturation % 96 %     1  Minute Oxygen Saturation % 96 %     1 Minute Liters of Oxygen 0 L     2 Minute Oxygen Saturation % 97 %     2 Minute Liters of Oxygen 0 L     3 Minute Oxygen Saturation % 96 %     3 Minute Liters of Oxygen 0 L     4 Minute Oxygen Saturation % 98 %     4 Minute Liters of Oxygen 0 L     5 Minute Oxygen Saturation % 97 %     5 Minute Liters of Oxygen 0 L     6 Minute Oxygen Saturation % 97 %     6 Minute Liters of Oxygen 0 L     2 Minute Post Oxygen Saturation % 96 %     2 Minute Post Liters of Oxygen 0 L              Oxygen Initial Assessment:   Oxygen Re-Evaluation:   Oxygen Discharge (Final Oxygen Re-Evaluation):   Initial Exercise Prescription:  Initial Exercise Prescription - 10/15/21 1400       Date of Initial Exercise RX and Referring Provider   Date 10/15/21    Referring Provider Loralie Champagne, MD    Expected Discharge Date 12/11/21      NuStep   Level 1    SPM 70    Minutes 20    METs 1.3      Prescription Details   Frequency (times per week) 2    Duration Progress to 30 minutes of continuous aerobic without signs/symptoms of physical distress      Intensity   THRR 40-80% of Max Heartrate 59-118    Ratings of Perceived Exertion 11-13    Perceived Dyspnea 0-4      Progression   Progression Continue progressive overload as per policy without signs/symptoms or physical distress.      Resistance Training   Training Prescription Yes    Weight 1 lb    Reps 10-15             Perform Capillary Blood Glucose checks as needed.  Exercise Prescription Changes:   Exercise Prescription Changes     Row Name 10/19/21 1400 11/04/21 1430           Response to Exercise   Blood Pressure (Admit) 138/60 112/70      Blood Pressure (Exercise) 142/78 118/72      Blood Pressure (Exit) 106/58 108/76      Heart Rate (Admit) 74 bpm 58 bpm      Heart Rate (Exercise) 78 bpm 73 bpm  Heart Rate (Exit) 59 bpm 58 bpm      Oxygen Saturation (Exercise) 95 %   O2 - 2 L/min via n/c 97 %      Oxygen Saturation (Exit) 96 % 98 %      Rating of Perceived Exertion (Exercise) 11 97      Perceived Dyspnea (Exercise) 1 1      Symptoms Nausea during warm-up Right hip pain 6/10      Comments Pt's first day in the [facility] Reviewed METs      Duration Progress to 30 minutes of  aerobic without signs/symptoms of physical distress Continue with 30 min of aerobic exercise without signs/symptoms of physical distress.      Intensity THRR unchanged THRR unchanged        Progression   Progression Continue to progress workloads to maintain intensity without signs/symptoms of physical distress. Continue to progress workloads to maintain intensity without signs/symptoms of physical distress.      Average METs 1.6 2        Resistance Training   Training Prescription Yes No      Weight 1 lb No weights on Wednesdays      Reps 10-15 --      Time 10 Minutes --        Interval Training   Interval Training No --        Oxygen   Oxygen Continuous Continuous      Liters 2 2        NuStep   Level 1 1      Minutes 20 30      METs 1.6 2        Oxygen   Maintain Oxygen Saturation -- 88% or higher               Exercise Comments:   Exercise Comments     Row Name 10/19/21 1437 11/05/21 0948         Exercise Comments Pt's first day in the [facility] program. Patient uses rolling walker. The patient is deconditioned and did warm up, weights and cool down from chair. Pt reports some nausea during the warm-up. Pt took a break and it subsided. Pt placed on O2 via n/c @ 2 L/min.  Pt was able to complete 20 minutes on the Nustep by taking breaks every 3-4 minutes.  Patient had one-to-one instruction on warm-up, cool-down, and weights. Pt will require slow progression. Reviewed METs. Pt is progressing and is now able to complete 30 minutes on the nustep. Pt is no longer taking breaks during her exercise, but is encouraged to do so if needed.               Exercise Goals  and Review:   Exercise Goals     Row Name 10/15/21 1434             Exercise Goals   Increase Physical Activity Yes       Intervention Provide advice, education, support and counseling about physical activity/exercise needs.;Develop an individualized exercise prescription for aerobic and resistive training based on initial evaluation findings, risk stratification, comorbidities and participant's personal goals.       Expected Outcomes Short Term: Attend rehab on a regular basis to increase amount of physical activity.;Long Term: Add in home exercise to make exercise part of routine and to increase amount of physical activity.;Long Term: Exercising regularly at least 3-5 days a week.       Increase Strength and Stamina Yes  Intervention Develop an individualized exercise prescription for aerobic and resistive training based on initial evaluation findings, risk stratification, comorbidities and participant's personal goals.;Provide advice, education, support and counseling about physical activity/exercise needs.       Expected Outcomes Short Term: Increase workloads from initial exercise prescription for resistance, speed, and METs.;Short Term: Perform resistance training exercises routinely during rehab and add in resistance training at home;Long Term: Improve cardiorespiratory fitness, muscular endurance and strength as measured by increased METs and functional capacity (6MWT)       Able to understand and use rate of perceived exertion (RPE) scale Yes       Intervention Provide education and explanation on how to use RPE scale       Expected Outcomes Short Term: Able to use RPE daily in rehab to express subjective intensity level;Long Term:  Able to use RPE to guide intensity level when exercising independently       Knowledge and understanding of Target Heart Rate Range (THRR) Yes       Intervention Provide education and explanation of THRR including how the numbers were predicted and  where they are located for reference       Expected Outcomes Short Term: Able to state/look up THRR;Short Term: Able to use daily as guideline for intensity in rehab;Long Term: Able to use THRR to govern intensity when exercising independently       Understanding of Exercise Prescription Yes       Intervention Provide education, explanation, and written materials on patient's individual exercise prescription       Expected Outcomes Short Term: Able to explain program exercise prescription;Long Term: Able to explain home exercise prescription to exercise independently                Exercise Goals Re-Evaluation :  Exercise Goals Re-Evaluation     Row Name 10/19/21 1434 10/19/21 1435           Exercise Goal Re-Evaluation   Exercise Goals Review Increase Physical Activity;Increase Strength and Stamina;Able to understand and use rate of perceived exertion (RPE) scale;Knowledge and understanding of Target Heart Rate Range (THRR);Understanding of Exercise Prescription (P)  Increase Physical Activity;Increase Strength and Stamina;Able to understand and use rate of perceived exertion (RPE) scale;Knowledge and understanding of Target Heart Rate Range (THRR);Understanding of Exercise Prescription      Comments Pt (P)  Pt's first day in the [facility] program. Pt understands the exercise Rx, THRR, and RPE scale.      Expected Outcomes -- Will continue to monitor patient and progress exercise workloads as tolerated.               Discharge Exercise Prescription (Final Exercise Prescription Changes):  Exercise Prescription Changes - 11/04/21 1430       Response to Exercise   Blood Pressure (Admit) 112/70    Blood Pressure (Exercise) 118/72    Blood Pressure (Exit) 108/76    Heart Rate (Admit) 58 bpm    Heart Rate (Exercise) 73 bpm    Heart Rate (Exit) 58 bpm    Oxygen Saturation (Exercise) 97 %    Oxygen Saturation (Exit) 98 %    Rating of Perceived Exertion (Exercise) 97    Perceived  Dyspnea (Exercise) 1    Symptoms Right hip pain 6/10    Comments Reviewed METs    Duration Continue with 30 min of aerobic exercise without signs/symptoms of physical distress.    Intensity THRR unchanged      Progression   Progression  Continue to progress workloads to maintain intensity without signs/symptoms of physical distress.    Average METs 2      Resistance Training   Training Prescription No    Weight No weights on Wednesdays      Oxygen   Oxygen Continuous    Liters 2      NuStep   Level 1    Minutes 30    METs 2      Oxygen   Maintain Oxygen Saturation 88% or higher             Nutrition:  Target Goals: Understanding of nutrition guidelines, daily intake of sodium '1500mg'$ , cholesterol '200mg'$ , calories 30% from fat and 7% or less from saturated fats, daily to have 5 or more servings of fruits and vegetables.  Biometrics:  Pre Biometrics - 10/15/21 1105       Pre Biometrics   Waist Circumference 46 inches    Hip Circumference 58.5 inches    Waist to Hip Ratio 0.79 %    Triceps Skinfold 45 mm    % Body Fat 54.5 %    Grip Strength 18 kg    Flexibility --   Not performed due to low back poroblems   Single Leg Stand --   Uses walker, h/o of falls; not performed             Nutrition Therapy Plan and Nutrition Goals:  Nutrition Therapy & Goals - 10/19/21 1415       Nutrition Therapy   Diet Heart Healthy Diet    Drug/Food Interactions Statins/Certain Fruits      Personal Nutrition Goals   Nutrition Goal Patient to identify strategies for managing cardiovascular risk by attending the weekly [facility] education and nutrition courses.    Personal Goal #2 Patient to identify strategies for weight loss of 0.5-2.0# per week of weight loss    Personal Goal #3 Patient to limit to '1500mg'$  of sodium daily    Personal Goal #4 Patient to identify food sources and limit daily intake of saturated fat, trans fat, sodium, and refined carbohydrates.    Comments  Patient reports that she has completed cardiac rehab previously. She reports highest adult weight is 330#; she is prescribed Ozempic for weight loss. Her husband does the grocery shopping and cooking; he is supporitve of making lifestyle/dietary changes.      Intervention Plan   Intervention Prescribe, educate and counsel regarding individualized specific dietary modifications aiming towards targeted core components such as weight, hypertension, lipid management, diabetes, heart failure and other comorbidities.;Nutrition handout(s) given to patient.    Expected Outcomes Short Term Goal: Understand basic principles of dietary content, such as calories, fat, sodium, cholesterol and nutrients.;Long Term Goal: Adherence to prescribed nutrition plan.             Nutrition Assessments:  Nutrition Assessments - 10/19/21 1423       Rate Your Plate Scores   Pre Score 78            MEDIFICTS Score Key: ?70 Need to make dietary changes  40-70 Heart Healthy Diet ? 40 Therapeutic Level Cholesterol Diet   Flowsheet Row INTENSIVE CARDIAC REHAB from 10/19/2021 in [facility]  Picture Your Plate Total Score on Admission 78      Picture Your Plate Scores: <06 Unhealthy dietary pattern with much room for improvement. 41-50 Dietary pattern unlikely to meet recommendations for good health and room for improvement. 51-60 More healthful dietary pattern, with  some room for improvement.  >60 Healthy dietary pattern, although there may be some specific behaviors that could be improved.    Nutrition Goals Re-Evaluation:  Nutrition Goals Re-Evaluation     [place] Name 10/19/21 1415             Goals   Current Weight 232 lb 2.3 oz (105.3 kg)       Comment LDL 102, A1c 6.3       Expected Outcome Patient reports that she has completed cardiac rehab previously. She reports highest adult weight is 330#; she is prescribed Ozempic for weight loss. Her husband does the  grocery shopping and cooking; he is supporitve of making lifestyle/dietary changes. She does report meal skipping and denies snacking.                Nutrition Goals Re-Evaluation:  Nutrition Goals Re-Evaluation     [place] Name 10/19/21 1415             Goals   Current Weight 232 lb 2.3 oz (105.3 kg)       Comment LDL 102, A1c 6.3       Expected Outcome Patient reports that she has completed cardiac rehab previously. She reports highest adult weight is 330#; she is prescribed Ozempic for weight loss. Her husband does the grocery shopping and cooking; he is supporitve of making lifestyle/dietary changes. She does report meal skipping and denies snacking.                Nutrition Goals Discharge (Final Nutrition Goals Re-Evaluation):  Nutrition Goals Re-Evaluation - 10/19/21 1415       Goals   Current Weight 232 lb 2.3 oz (105.3 kg)    Comment LDL 102, A1c 6.3    Expected Outcome Patient reports that she has completed cardiac rehab previously. She reports highest adult weight is 330#; she is prescribed Ozempic for weight loss. Her husband does the grocery shopping and cooking; he is supporitve of making lifestyle/dietary changes. She does report meal skipping and denies snacking.             Psychosocial: Target Goals: Acknowledge presence or absence of significant depression and/or stress, maximize coping skills, provide positive support system. Participant is able to verbalize types and ability to use techniques and skills needed for reducing stress and depression.  Initial Review & Psychosocial Screening:  Initial Psych Review & Screening - 10/15/21 1531       Initial Review   Current issues with Current Stress Concerns;History of Depression    Source of Stress Concerns Chronic Illness;Unable to participate in former interests or hobbies;Unable to perform yard/household activities;Family    Comments History of Depression, Mother is under hospice care. Daughter has  lupus.      Family Dynamics   Good Support System? Yes   Clotilda has her huband, daughter and sister for support.     Barriers   Psychosocial barriers to participate in program The patient should benefit from training in stress management and relaxation.      Screening Interventions   Interventions Encouraged to exercise;To provide support and resources with identified psychosocial needs;Provide feedback about the scores to participant    Expected Outcomes Long Term Goal: Stressors or current issues are controlled or eliminated.;Short Term goal: Identification and review with participant of any Quality of Life or Depression concerns found by scoring the questionnaire.;Long Term goal: The participant improves quality of Life and PHQ9 Scores as seen by post scores and/or verbalization of changes  Quality of Life Scores:  Quality of Life - 10/15/21 1408       Quality of Life   Select Quality of Life      Quality of Life Scores   Health/Function Pre 7.2 %    Socioeconomic Pre 24.79 %    Psych/Spiritual Pre 9.86 %    Family Pre 26.4 %    GLOBAL Pre 14.19 %            Scores of 19 and below usually indicate a poorer quality of life in these areas.  A difference of  2-3 points is a clinically meaningful difference.  A difference of 2-3 points in the total score of the Quality of Life Index has been associated with significant improvement in overall quality of life, self-image, physical symptoms, and general health in studies assessing change in quality of life.  PHQ-9: Review Flowsheet  More data exists      10/26/2021 10/15/2021 10/12/2018 10/28/2016 09/29/2016  Depression screen PHQ 2/9  Decreased Interest '3 1 2 '$ 0 0  Down, Depressed, Hopeless 3 0 1 - 0  PHQ - 2 Score '6 1 3 '$ 0 0  Altered sleeping 1 - 1 - -  Tired, decreased energy 3 - 1 - -  Change in appetite 0 - 0 - -  Feeling bad or failure about yourself  3 - 1 - -  Trouble concentrating 2 - 1 - -  Moving slowly  or fidgety/restless 1 - 0 - -  Suicidal thoughts 0 - 0 - -  PHQ-9 Score 16 - 7 - -  Difficult doing work/chores Very difficult - Somewhat difficult - -   Interpretation of Total Score  Total Score Depression Severity:  1-4 = Minimal depression, 5-9 = Mild depression, 10-14 = Moderate depression, 15-19 = Moderately severe depression, 20-27 = Severe depression   Psychosocial Evaluation and Intervention:   Psychosocial Re-Evaluation:  Psychosocial Re-Evaluation     [place] Name 10/20/21 0920 11/10/21 1342           Psychosocial Re-Evaluation   Current issues with Current Stress Concerns;Current Depression;History of Depression Current Stress Concerns;Current Depression;History of Depression      Comments Reviewed quality of life admits to being depressed will forward to Dr Serita Grammes has not voiced an increase in depression. [place] is to discuss counseling with Dr Sandi Mariscal at her upcoming follow up appointment wiht him.      Expected Outcomes Lynnett will have decreased or controlled depression upon completion of intensive cardiac rehab [place]ia will have decreased or controlled depression upon completion of intensive cardiac rehab      Interventions Physician referral;Encouraged to attend Cardiac Rehabilitation for the exercise;Stress management education Physician referral;Encouraged to attend Cardiac Rehabilitation for the exercise;Stress management education      Continue Psychosocial Services  Follow up required by staff Follow up required by staff        Initial Review   Source of Stress Concerns Chronic Illness;Poor Coping Skills;Unable to perform yard/household activities;Unable to participate in former interests or hobbies;Family Chronic Illness;Poor Coping Skills;Unable to perform yard/household activities;Unable to participate in former interests or hobbies;Family      Comments Will continue to monitor and offer support as needed. Will continue to monitor and offer support as  needed.               Psychosocial Discharge (Final Psychosocial Re-Evaluation):  Psychosocial Re-Evaluation - 11/10/21 1342       Psychosocial Re-Evaluation   Current issues with Current  Stress Concerns;Current Depression;History of Depression    Comments Jerene has not voiced an increase in depression. Zylpha is to discuss counseling with Dr Sandi Mariscal at her upcoming follow up appointment wiht him.    Expected Outcomes Oluwadamilola will have decreased or controlled depression upon completion of intensive cardiac rehab    Interventions Physician referral;Encouraged to attend Cardiac Rehabilitation for the exercise;Stress management education    Continue Psychosocial Services  Follow up required by staff      Initial Review   Source of Stress Concerns Chronic Illness;Poor Coping Skills;Unable to perform yard/household activities;Unable to participate in former interests or hobbies;Family    Comments Will continue to monitor and offer support as needed.             Vocational Rehabilitation: Provide vocational rehab assistance to qualifying candidates.   Vocational Rehab Evaluation & Intervention:  Vocational Rehab - 10/15/21 1537       Initial Vocational Rehab Evaluation & Intervention   Assessment shows need for Vocational Rehabilitation No   Joory is retired and does not need vocational rehab at this time            Education: Education Goals: Education classes will be provided on a weekly basis, covering required topics. Participant will state understanding/return demonstration of topics presented.    Education     [place] Name 10/19/21 1500     Education   Cardiac Education Topics Pritikin   Environmental consultant Exercise   Exercise Workshop Exercise Basics: Building Your Action Plan   Instruction Review Code 1- Verbalizes Understanding   Class Start Time 1411   Class Stop Time 1451   Class Time Calculation  (min) 40 min    [place] Name 10/26/21 1500     Education   Cardiac Education Topics Pritikin   Lexicographer Nutrition   Nutrition Nutrition Action Plan   Instruction Review Code 1- Verbalizes Understanding   Class Start Time 1400   Class Stop Time 1450   Class Time Calculation (min) 50 min    [place] Name 10/28/21 1500     Education   Cardiac Education Topics Pritikin   Financial trader   Weekly Topic Simple Sides and Sauces   Instruction Review Code 1- Verbalizes Understanding   Class Start Time 1359   Class Stop Time 1440   Class Time Calculation (min) 41 min    [place] Name 11/02/21 1500     Education   Cardiac Education Topics Pritikin   Lexicographer Nutrition   Nutrition Dining Out - Part 1   Instruction Review Code 1- Verbalizes Understanding   Class Start Time 1358   Class Stop Time 1445   Class Time Calculation (min) 47 min    [place] Name 11/04/21 1500     Education   Cardiac Education Topics [place]   Educator Dietitian   Weekly Topic One-Pot Wonders   Instruction Review Code 1- Verbalizes Understanding   Class Start Time 1347   Class Stop Time 1440   Class Time Calculation (min) 53 min            Core Videos: Exercise    Move  It!  Clinical staff conducted group or individual video education with verbal and written material and guidebook.  Patient learns the recommended Pritikin exercise program. Exercise with the goal of living a long, healthy life. Some of the health benefits of exercise include controlled diabetes, healthier blood pressure levels, improved cholesterol levels, improved heart and lung capacity, improved sleep, and better body composition. Everyone should speak with their doctor before starting or changing an exercise routine.  Biomechanical  Limitations Clinical staff conducted group or individual video education with verbal and written material and guidebook.  Patient learns how biomechanical limitations can impact exercise and how we can mitigate and possibly overcome limitations to have an impactful and balanced exercise routine.  Body Composition Clinical staff conducted group or individual video education with verbal and written material and guidebook.  Patient learns that body composition (ratio of muscle mass to fat mass) is a key component to assessing overall fitness, rather than body weight alone. Increased fat mass, especially visceral belly fat, can put [place] at increased risk for metabolic syndrome, type 2 diabetes, heart disease, and even death. It is recommended to combine diet and exercise (cardiovascular and resistance training) to improve your body composition. Seek guidance from your physician and exercise physiologist before implementing an exercise routine.  Exercise Action Plan Clinical staff conducted group or individual video education with verbal and written material and guidebook.  Patient learns the recommended strategies to achieve and enjoy long-term exercise adherence, including variety, self-motivation, self-efficacy, and positive decision making. Benefits of exercise include fitness, good health, weight management, more energy, better sleep, less stress, and overall well-being.  Medical   Heart Disease Risk Reduction Clinical staff conducted group or individual video education with verbal and written material and guidebook.  Patient learns our heart is our most vital organ as it circulates oxygen, nutrients, white blood cells, and hormones throughout the entire body, and carries waste away. Data supports a plant-based eating plan like the Pritikin Program for its effectiveness in slowing progression of and reversing heart disease. The video provides a number of recommendations to address heart  disease.   Metabolic Syndrome and Belly Fat  Clinical staff conducted group or individual video education with verbal and written material and guidebook.  Patient learns what metabolic syndrome is, how it leads to heart disease, and how one can reverse it and keep it from coming back. You have metabolic syndrome if you have 3 of the following 5 criteria: abdominal obesity, high blood pressure, high triglycerides, low HDL cholesterol, and high blood sugar.  Hypertension and Heart Disease Clinical staff conducted group or individual video education with verbal and written material and guidebook.  Patient learns that high blood pressure, or hypertension, is very common in the [place]. Hypertension is largely due to excessive salt intake, but other important risk factors include being overweight, physical inactivity, drinking too much alcohol, smoking, and not eating enough potassium from fruits and vegetables. High blood pressure is a leading risk factor for heart attack, stroke, congestive heart failure, dementia, kidney failure, and premature death. Long-term effects of excessive salt intake include stiffening of the arteries and thickening of heart muscle and organ damage. Recommendations include ways to reduce hypertension and the risk of heart disease.  Diseases of Our Time - Focusing on Diabetes Clinical staff conducted group or individual video education with verbal and written material and guidebook.  Patient learns why the best way to stop diseases of our time is prevention, through food and other lifestyle  changes. Medicine (such as prescription pills and surgeries) is often only a Band-Aid on the problem, not a long-term solution. Most common diseases of our time include obesity, type 2 diabetes, hypertension, heart disease, and cancer. The Pritikin Program is recommended and has been proven to help reduce, reverse, and/or prevent the damaging effects of metabolic syndrome.  Nutrition    Overview of the Pritikin Eating Plan  Clinical staff conducted group or individual video education with verbal and written material and guidebook.  Patient learns about the [place] for disease risk reduction. The [place] emphasizes a wide variety of unrefined, minimally-processed carbohydrates, like fruits, vegetables, whole grains, and legumes. Go, Caution, and Stop food choices are explained. Plant-based and lean animal proteins are emphasized. Rationale provided for low sodium intake for blood pressure control, low added sugars for blood sugar stabilization, and low added fats and oils for coronary artery disease risk reduction and weight management.  Calorie Density  Clinical staff conducted group or individual video education with verbal and written material and guidebook.  Patient learns about calorie density and how it impacts the Pritikin Eating Plan. Knowing the characteristics of the food you choose will help you decide whether those foods will lead to weight gain or weight loss, and whether you want to consume more or less of them. Weight loss is usually a side effect of the Pritikin Eating Plan because of its focus on low calorie-dense foods.  Label Reading  Clinical staff conducted group or individual video education with verbal and written material and guidebook.  Patient learns about the Pritikin recommended label reading guidelines and corresponding recommendations regarding calorie density, added sugars, sodium content, and whole grains.  Dining Out - Part 1  Clinical staff conducted group or individual video education with verbal and written material and guidebook.  Patient learns that restaurant meals can be sabotaging because they can be so high in calories, fat, sodium, and/or sugar. Patient learns recommended strategies on how to positively address this and avoid unhealthy pitfalls.  Facts on Fats  Clinical staff conducted group or individual video  education with verbal and written material and guidebook.  Patient learns that lifestyle modifications can be just as effective, if not more so, as many medications for lowering your risk of heart disease. A Pritikin lifestyle can help to reduce your risk of inflammation and atherosclerosis (cholesterol build-up, or plaque, in the artery walls). Lifestyle interventions such as dietary choices and physical activity address the cause of atherosclerosis. A review of the types of fats and their impact on blood cholesterol levels, along with dietary recommendations to reduce fat intake is also included.  Nutrition Action Plan  Clinical staff conducted group or individual video education with verbal and written material and guidebook.  Patient learns how to incorporate Pritikin recommendations into their lifestyle. Recommendations include planning and keeping personal health goals in mind as an important part of their success.  Healthy Mind-Set    Healthy Minds, Bodies, Hearts  Clinical staff conducted group or individual video education with verbal and written material and guidebook.  Patient learns how to identify when they are stressed. Video will discuss the impact of that stress, as well as the many benefits of stress management. Patient will also be introduced to stress management techniques. The way we think, act, and feel has an impact on our hearts.  How Our Thoughts Can Heal Our Hearts  Clinical staff conducted group or individual video education with verbal and written material and  guidebook.  Patient learns that negative thoughts can cause depression and anxiety. This can result in negative lifestyle behavior and serious health problems. Cognitive behavioral therapy is an effective method to help control our thoughts in order to change and improve our emotional outlook.  Additional Videos:  Exercise    Improving Performance  Clinical staff conducted group or individual video education with  verbal and written material and guidebook.  Patient learns to use a non-linear approach by alternating intensity levels and lengths of time spent exercising to help burn more calories and lose more body fat. Cardiovascular exercise helps improve heart health, metabolism, hormonal balance, blood sugar control, and recovery from fatigue. Resistance training improves strength, endurance, balance, coordination, reaction time, metabolism, and muscle mass. Flexibility exercise improves circulation, posture, and balance. Seek guidance from your physician and exercise physiologist before implementing an exercise routine and learn your capabilities and proper form for all exercise.  Introduction to Yoga  Clinical staff conducted group or individual video education with verbal and written material and guidebook.  Patient learns about yoga, a discipline of the coming together of mind, breath, and body. The benefits of yoga include improved flexibility, improved range of motion, better posture and core strength, increased lung function, weight loss, and positive self-image. Yoga's heart health benefits include lowered blood pressure, healthier heart rate, decreased cholesterol and triglyceride levels, improved immune function, and reduced stress. Seek guidance from your physician and exercise physiologist before implementing an exercise routine and learn your capabilities and proper form for all exercise.  Medical   Aging: Enhancing Your Quality of Life  Clinical staff conducted group or individual video education with verbal and written material and guidebook.  Patient learns key strategies and recommendations to stay in good physical health and enhance quality of life, such as prevention strategies, having an advocate, securing a [place], and keeping a list of medications and system for tracking them. It also discusses how to avoid risk for bone loss.  Biology of Weight Control   Clinical staff conducted group or individual video education with verbal and written material and guidebook.  Patient learns that weight gain occurs because we consume more calories than we burn (eating more, moving less). Even if your body weight is normal, you may have higher ratios of fat compared to muscle mass. Too much body fat puts you at increased risk for cardiovascular disease, heart attack, stroke, type 2 diabetes, and obesity-related cancers. In addition to exercise, following the [place] can help reduce your risk.  Decoding Lab Results  Clinical staff conducted group or individual video education with verbal and written material and guidebook.  Patient learns that lab test reflects one measurement whose values change over time and are influenced by many factors, including medication, stress, sleep, exercise, food, hydration, pre-existing medical conditions, and more. It is recommended to use the knowledge from this video to become more involved with your lab results and evaluate your numbers to speak with your doctor.   Diseases of Our Time - Overview  Clinical staff conducted group or individual video education with verbal and written material and guidebook.  Patient learns that according to the CDC, 50% to 70% of chronic diseases (such as obesity, type 2 diabetes, elevated lipids, hypertension, and heart disease) are avoidable through lifestyle improvements including healthier food choices, listening to satiety cues, and increased physical activity.  Sleep Disorders Clinical staff conducted group or individual video education with verbal and written material  and guidebook.  Patient learns how good quality and duration of sleep are important to overall health and well-being. Patient also learns about sleep disorders and how they impact health along with recommendations to address them, including discussing with a physician.  Nutrition  Dining Out - Part 2 Clinical staff  conducted group or individual video education with verbal and written material and guidebook.  Patient learns how to plan ahead and communicate in order to maximize their dining experience in a healthy and nutritious manner. Included are recommended food choices based on the type of restaurant the patient is visiting.   Fueling a Best boy conducted group or individual video education with verbal and written material and guidebook.  There is a strong connection between our food choices and our health. Diseases like obesity and type 2 diabetes are very prevalent and are in large-part due to lifestyle choices. The Pritikin Eating Plan provides plenty of food and hunger-curbing satisfaction. It is easy to follow, affordable, and helps reduce health risks.  Menu Workshop  Clinical staff conducted group or individual video education with verbal and written material and guidebook.  Patient learns that restaurant meals can sabotage health goals because they are often packed with calories, fat, sodium, and sugar. Recommendations include strategies to plan ahead and to communicate with the manager, chef, or server to help order a healthier meal.  Planning Your Eating Strategy  Clinical staff conducted group or individual video education with verbal and written material and guidebook.  Patient learns about the [place] and its benefit of reducing the risk of disease. The [place] does not focus on calories. Instead, it emphasizes high-quality, nutrient-rich foods. By knowing the characteristics of the foods, we choose, we can determine their calorie density and make informed decisions.  Targeting Your Nutrition Priorities  Clinical staff conducted group or individual video education with verbal and written material and guidebook.  Patient learns that lifestyle habits have a tremendous impact on disease risk and progression. This video provides eating and physical  activity recommendations based on your personal health goals, such as reducing LDL cholesterol, losing weight, preventing or controlling type 2 diabetes, and reducing high blood pressure.  Vitamins and Minerals  Clinical staff conducted group or individual video education with verbal and written material and guidebook.  Patient learns different ways to obtain key vitamins and minerals, including through a recommended healthy diet. It is important to discuss all supplements you take with your doctor.   Healthy Mind-Set    Smoking Cessation  Clinical staff conducted group or individual video education with verbal and written material and guidebook.  Patient learns that cigarette smoking and tobacco addiction pose a serious health risk which affects millions of people. Stopping smoking will significantly reduce the risk of heart disease, lung disease, and many forms of cancer. Recommended strategies for quitting are covered, including working with your doctor to develop a successful plan.  Culinary   Becoming a Financial trader conducted group or individual video education with verbal and written material and guidebook.  Patient learns that cooking at home can be healthy, cost-effective, quick, and puts them in control. Keys to cooking healthy recipes will include looking at your recipe, assessing your equipment needs, planning ahead, making it simple, choosing cost-effective seasonal ingredients, and limiting the use of added fats, salts, and sugars.  Cooking - Breakfast and Snacks  Clinical staff conducted group or individual video education with verbal and written material  and guidebook.  Patient learns how important breakfast is to satiety and nutrition through the entire day. Recommendations include key foods to eat during breakfast to help stabilize blood sugar levels and to prevent overeating at meals later in the day. Planning ahead is also a key component.  Cooking - Retail buyer conducted group or individual video education with verbal and written material and guidebook.  Patient learns eating strategies to improve overall health, including an approach to cook more at home. Recommendations include thinking of animal protein as a side on your plate rather than center stage and focusing instead on lower calorie dense options like vegetables, fruits, whole grains, and plant-based proteins, such as beans. Making sauces in large quantities to freeze for later and leaving the skin on your vegetables are also recommended to maximize your experience.  Cooking - Healthy Salads and Dressing Clinical staff conducted group or individual video education with verbal and written material and guidebook.  Patient learns that vegetables, fruits, whole grains, and legumes are the foundations of the [place]. Recommendations include how to incorporate each of these in flavorful and healthy salads, and how to create homemade salad dressings. Proper handling of ingredients is also covered. Cooking - Soups and [employer] - Soups and Desserts Clinical staff conducted group or individual video education with verbal and written material and guidebook.  Patient learns that Pritikin soups and desserts make for easy, nutritious, and delicious snacks and meal components that are low in sodium, fat, sugar, and calorie density, while high in vitamins, minerals, and filling fiber. Recommendations include simple and healthy ideas for soups and desserts.   Overview     The Pritikin Solution Program Overview Clinical staff conducted group or individual video education with verbal and written material and guidebook.  Patient learns that the results of the [place] Program have been documented in more than 100 articles published in peer-reviewed journals, and the benefits include reducing risk factors for (and, in some cases, even reversing) high cholesterol, high  blood pressure, type 2 diabetes, obesity, and more! An overview of the three key pillars of the Pritikin Program will be covered: eating well, doing regular exercise, and having a healthy mind-set.  WORKSHOPS  Exercise: Exercise Basics: Building Your Action Plan Clinical staff led group instruction and group discussion with PowerPoint presentation and patient guidebook. To enhance the learning environment the use of posters, models and videos may be added. At the conclusion of this workshop, patients will comprehend the difference between physical activity and exercise, as well as the benefits of incorporating both, into their routine. Patients will understand the FITT (Frequency, Intensity, Time, and Type) principle and how to use it to build an exercise action plan. In addition, safety concerns and other considerations for exercise and cardiac rehab will be addressed by the presenter. The purpose of this lesson is to promote a comprehensive and effective weekly exercise routine in order to improve patients' overall level of fitness.   Managing Heart Disease: Your Path to a Healthier Heart Clinical staff led group instruction and group discussion with PowerPoint presentation and patient guidebook. To enhance the learning environment the use of posters, models and videos may be added.At the conclusion of this workshop, patients will understand the anatomy and physiology of the heart. Additionally, they will understand how Pritikin's three pillars impact the risk factors, the progression, and the management of heart disease.  The purpose of this lesson is to provide a high-level  overview of the heart, heart disease, and how the Pritikin lifestyle positively impacts risk factors.  Exercise Biomechanics Clinical staff led group instruction and group discussion with PowerPoint presentation and patient guidebook. To enhance the learning environment the use of posters, models and videos may be added.  Patients will learn how the structural parts of their bodies function and how these functions impact their daily activities, movement, and exercise. Patients will learn how to promote a neutral spine, learn how to manage pain, and identify ways to improve their physical movement in order to promote healthy living. The purpose of this lesson is to expose patients to common physical limitations that impact physical activity. Participants will learn practical ways to adapt and manage aches and pains, and to minimize their effect on regular exercise. Patients will learn how to maintain good posture while sitting, walking, and lifting.  Balance Training and Fall Prevention  Clinical staff led group instruction and group discussion with PowerPoint presentation and patient guidebook. To enhance the learning environment the use of posters, models and videos may be added. At the conclusion of this workshop, patients will understand the importance of their sensorimotor skills (vision, proprioception, and the vestibular system) in maintaining their ability to balance as they age. Patients will apply a variety of balancing exercises that are appropriate for their current level of function. Patients will understand the common causes for poor balance, possible solutions to these problems, and ways to modify their physical environment in order to minimize their fall risk. The purpose of this lesson is to teach patients about the importance of maintaining balance as they age and ways to minimize their risk of falling.  WORKSHOPS   Nutrition:  Fueling a Scientist, research (physical sciences) led group instruction and group discussion with PowerPoint presentation and patient guidebook. To enhance the learning environment the use of posters, models and videos may be added. Patients will review the foundational principles of the [place] and understand what constitutes a serving size in each of the food groups.  Patients will also learn Pritikin-friendly foods that are better choices when away from home and review make-ahead meal and snack options. Calorie density will be reviewed and applied to three nutrition priorities: weight maintenance, weight loss, and weight gain. The purpose of this lesson is to reinforce (in a group setting) the key concepts around what patients are recommended to eat and how to apply these guidelines when away from home by planning and selecting Pritikin-friendly options. Patients will understand how calorie density may be adjusted for different weight management goals.  Mindful Eating  Clinical staff led group instruction and group discussion with PowerPoint presentation and patient guidebook. To enhance the learning environment the use of posters, models and videos may be added. Patients will briefly review the concepts of the [place] and the importance of low-calorie dense foods. The concept of mindful eating will be introduced as well as the importance of paying attention to internal hunger signals. Triggers for non-hunger eating and techniques for dealing with triggers will be explored. The purpose of this lesson is to provide patients with the opportunity to review the basic principles of the [place], discuss the value of eating mindfully and how to measure internal cues of hunger and fullness using the Hunger Scale. Patients will also discuss reasons for non-hunger eating and learn strategies to use for controlling emotional eating.  Targeting Your Nutrition Priorities Clinical staff led group instruction and group discussion with PowerPoint presentation and  patient guidebook. To enhance the learning environment the use of posters, models and videos may be added. Patients will learn how to determine their genetic susceptibility to disease by reviewing their family history. Patients will gain insight into the importance of diet as part of an overall healthy  lifestyle in mitigating the impact of genetics and other environmental insults. The purpose of this lesson is to provide patients with the opportunity to assess their personal nutrition priorities by looking at their family history, their own health history and current risk factors. Patients will also be able to discuss ways of prioritizing and modifying the [place] for their highest risk areas  Menu  Clinical staff led group instruction and group discussion with PowerPoint presentation and patient guidebook. To enhance the learning environment the use of posters, models and videos may be added. Using menus brought in from [employer], or printed from [employer], patients will apply the [place] dining out guidelines that were presented in the [employer] video. Patients will also be able to practice these guidelines in a variety of provided scenarios. The purpose of this lesson is to provide patients with the opportunity to practice hands-on learning of the [place] with actual menus and practice scenarios.  Label Reading Clinical staff led group instruction and group discussion with PowerPoint presentation and patient guidebook. To enhance the learning environment the use of posters, models and videos may be added. Patients will review and discuss the Pritikin label reading guidelines presented in Pritikin's Label Reading Educational series video. Using fool labels brought in from local grocery stores and markets, patients will apply the label reading guidelines and determine if the packaged food meet the Pritikin guidelines. The purpose of this lesson is to provide patients with the opportunity to review, discuss, and practice hands-on learning of the Pritikin Label Reading guidelines with actual packaged food labels. [place] Workshops are designed to teach patients ways to prepare quick, simple, and  affordable recipes at home. The importance of nutrition's role in chronic disease risk reduction is reflected in its emphasis in the overall Pritikin program. By learning how to prepare essential core Pritikin Eating Plan recipes, patients will increase control over what they eat; be able to customize the flavor of foods without the use of added salt, sugar, or fat; and improve the quality of the food they consume. By learning a set of core recipes which are easily assembled, quickly prepared, and affordable, patients are more likely to prepare more healthy foods at home. These workshops focus on convenient breakfasts, simple entres, side dishes, and desserts which can be prepared with minimal effort and are consistent with nutrition recommendations for cardiovascular risk reduction. Cooking [employer] are taught by a Engineer, materials (RD) who has been trained by the [employer]. The chef or RD has a clear understanding of the importance of minimizing - if not completely eliminating - added fat, sugar, and sodium in recipes. Throughout the series of [place] Workshop sessions, patients will learn about healthy ingredients and efficient methods of cooking to build confidence in their capability to prepare    Cooking School weekly topics:  Adding Flavor- Sodium-Free  Fast and Healthy Breakfasts  Powerhouse Plant-Based Proteins  Satisfying Salads and Dressings  Simple Sides and Sauces  International Cuisine-Spotlight on the [place] Zones  Delicious Desserts  Savory Soups  [employer] - Meals in a Snap  Tasty Appetizers and Snacks  Comforting Weekend Breakfasts  One-Pot Wonders   Fast [employer] Your Pritikin Plate  WORKSHOPS   Healthy Mindset (Psychosocial): New Thoughts, New Behaviors Clinical staff led group instruction and group discussion with PowerPoint presentation and patient guidebook. To enhance the  learning environment the use of posters, models and videos may be added. Patients will learn and practice techniques for developing effective health and lifestyle goals. Patients will be able to effectively apply the goal setting process learned to develop at least one new personal goal.  The purpose of this lesson is to expose patients to a new skill set of behavior modification techniques such as techniques setting SMART goals, overcoming barriers, and achieving new thoughts and new behaviors.  Managing Moods and Relationships Clinical staff led group instruction and group discussion with PowerPoint presentation and patient guidebook. To enhance the learning environment the use of posters, models and videos may be added. Patients will learn how emotional and chronic stress factors can impact their health and relationships. They will learn healthy ways to manage their moods and utilize positive coping mechanisms. In addition, ICR patients will learn ways to improve communication skills. The purpose of this lesson is to expose patients to ways of understanding how one's mood and health are intimately connected. Developing a healthy outlook can help build positive relationships and connections with others. Patients will understand the importance of utilizing effective communication skills that include actively listening and being heard. They will learn and understand the importance of the "4 Cs" and especially Connections in fostering of a Healthy Mind-Set.  Healthy Sleep for a Healthy Heart Clinical staff led group instruction and group discussion with PowerPoint presentation and patient guidebook. To enhance the learning environment the use of posters, models and videos may be added. At the conclusion of this workshop, patients will be able to demonstrate knowledge of the importance of sleep to overall health, well-being, and quality of life. They will understand the symptoms of, and treatments for, common  sleep disorders. Patients will also be able to identify daytime and nighttime behaviors which impact sleep, and they will be able to apply these tools to help manage sleep-related challenges. The purpose of this lesson is to provide patients with a general overview of sleep and outline the importance of quality sleep. Patients will learn about a few of the most common sleep disorders. Patients will also be introduced to the concept of "sleep hygiene," and discover ways to self-manage certain sleeping problems through simple daily behavior changes. Finally, the workshop will motivate patients by clarifying the links between quality sleep and their goals of heart-healthy living.   Recognizing and Reducing Stress Clinical staff led group instruction and group discussion with PowerPoint presentation and patient guidebook. To enhance the learning environment the use of posters, models and videos may be added. At the conclusion of this workshop, patients will be able to understand the types of stress reactions, differentiate between acute and chronic stress, and recognize the impact that chronic stress has on their health. They will also be able to apply different coping mechanisms, such as reframing negative self-talk. Patients will have the opportunity to practice a variety of stress management techniques, such as deep abdominal breathing, progressive muscle relaxation, and/or guided imagery.  The purpose of this lesson is to educate patients on the role of stress in their lives and to provide healthy techniques for coping with it.  Learning Barriers/Preferences:  Learning Barriers/Preferences - 10/15/21 1412  Learning Barriers/Preferences   Learning Barriers Sight    Learning Preferences Skilled Demonstration             Education Topics:  Knowledge Questionnaire Score:  Knowledge Questionnaire Score - 10/15/21 1409       Knowledge Questionnaire Score   Pre Score 22/24              Core Components/Risk Factors/Patient Goals at Admission:  Personal Goals and Risk Factors at Admission - 10/15/21 1410       Core Components/Risk Factors/Patient Goals on Admission    Weight Management Yes;Obesity;Weight Loss    Intervention Weight Management: Develop a combined nutrition and exercise program designed to reach desired caloric intake, while maintaining appropriate intake of nutrient and fiber, sodium and fats, and appropriate energy expenditure required for the weight goal.;Weight Management: Provide education and appropriate resources to help participant work on and attain dietary goals.;Weight Management/Obesity: Establish reasonable short term and long term weight goals.;Obesity: Provide education and appropriate resources to help participant work on and attain dietary goals.    Admit Weight 230 lb 13.2 oz (104.7 kg)    Expected Outcomes Long Term: Adherence to nutrition and physical activity/exercise program aimed toward attainment of established weight goal;Short Term: Continue to assess and modify interventions until short term weight is achieved;Weight Maintenance: Understanding of the daily nutrition guidelines, which includes 25-35% calories from fat, 7% or less cal from saturated fats, less than '200mg'$  cholesterol, less than 1.5gm of sodium, & 5 or more servings of fruits and vegetables daily;Weight Loss: Understanding of general recommendations for a balanced deficit meal plan, which promotes 1-2 lb weight loss per week and includes a negative energy balance of [phone] kcal/d;Understanding recommendations for meals to include 15-35% energy as protein, 25-35% energy from fat, 35-60% energy from carbohydrates, less than '200mg'$  of dietary cholesterol, 20-35 gm of total fiber daily;Understanding of distribution of calorie intake throughout the day with the consumption of 4-5 meals/snacks    Improve shortness of breath with ADL's Yes    Intervention Provide education,  individualized exercise plan and daily activity instruction to help decrease symptoms of SOB with activities of daily living.    Expected Outcomes Short Term: Improve cardiorespiratory fitness to achieve a reduction of symptoms when performing ADLs;Long Term: Be able to perform more ADLs without symptoms or delay the onset of symptoms    Diabetes Yes    Intervention Provide education about signs/symptoms and action to take for hypo/hyperglycemia.;Provide education about proper nutrition, including hydration, and aerobic/resistive exercise prescription along with prescribed medications to achieve blood glucose in normal ranges: Fasting glucose 65-99 mg/dL    Expected Outcomes Short Term: Participant verbalizes understanding of the signs/symptoms and immediate care of hyper/hypoglycemia, proper foot care and importance of medication, aerobic/resistive exercise and nutrition plan for blood glucose control.;Long Term: Attainment of HbA1C < 7%.    Heart Failure Yes    Intervention Provide a combined exercise and nutrition program that is supplemented with education, support and counseling about heart failure. Directed toward relieving symptoms such as shortness of breath, decreased exercise tolerance, and extremity edema.    Expected Outcomes Improve functional capacity of life;Short term: Attendance in program 2-3 days a week with increased exercise capacity. Reported lower sodium intake. Reported increased fruit and vegetable intake. Reports medication compliance.;Short term: Daily weights obtained and reported for increase. Utilizing diuretic protocols set by physician.;Long term: Adoption of self-care skills and reduction of barriers for early signs and symptoms recognition and intervention leading to self-care  maintenance.    Hypertension Yes    Intervention Provide education on lifestyle modifcations including regular physical activity/exercise, weight management, moderate sodium restriction and increased  consumption of fresh fruit, vegetables, and low fat dairy, alcohol moderation, and smoking cessation.;Monitor prescription use compliance.    Expected Outcomes Short Term: Continued assessment and intervention until BP is < 140/62m HG in hypertensive participants. < 130/829mHG in hypertensive participants with diabetes, heart failure or chronic kidney disease.;Long Term: Maintenance of blood pressure at goal levels.    Lipids Yes    Intervention Provide education and support for participant on nutrition & aerobic/resistive exercise along with prescribed medications to achieve LDL '70mg'$ , HDL >'40mg'$ .    Expected Outcomes Short Term: Participant states understanding of desired cholesterol values and is compliant with medications prescribed. Participant is following exercise prescription and nutrition guidelines.;Long Term: Cholesterol controlled with medications as prescribed, with individualized exercise RX and with personalized nutrition plan. Value goals: LDL < '70mg'$ , HDL > 40 mg.    Stress Yes    Intervention Offer individual and/or small group education and counseling on adjustment to heart disease, stress management and health-related lifestyle change. Teach and support self-help strategies.;Refer participants experiencing significant psychosocial distress to appropriate mental health specialists for further evaluation and treatment. When possible, include family members and significant others in education/counseling sessions.    Expected Outcomes Short Term: Participant demonstrates changes in health-related behavior, relaxation and other stress management skills, ability to obtain effective social support, and compliance with psychotropic medications if prescribed.;Long Term: Emotional wellbeing is indicated by absence of clinically significant psychosocial distress or social isolation.             Core Components/Risk Factors/Patient Goals Review:   Goals and Risk Factor Review     Row Name  10/20/21 0923 11/10/21 1343           Core Components/Risk Factors/Patient Goals Review   Personal Goals Review Weight Management/Obesity;Lipids;Stress;Diabetes;Improve shortness of breath with ADL's;Hypertension Weight Management/Obesity;Lipids;Stress;Diabetes;Improve shortness of breath with ADL's;Hypertension      Review AnEllenoratarted intensive cardiac rehab on 10/19/21. AnConsuelaid well with exercise. Vital signs and CBG's were stable. New medication addtion noted. Nalee exercise with 2l/min of oxygen did not report chest pain. AnLatoyas been doing well with exercise for her fitness level. Vital signs, CBG's and oxygen saturationa have been stable as AnAsyahas been exercising on 2l/min of oxygen. Blood pressure has been better controlled. AnEleashaas not reported any chest pain. Weight unchanged.      Expected Outcomes AnRonellill continue to participate in intensve cardiac rehab for exercise, nutrition and lifestyle modifications AnLorileeill continue to participate in intensve cardiac rehab for exercise, nutrition and lifestyle modifications               Core Components/Risk Factors/Patient Goals at Discharge (Final Review):   Goals and Risk Factor Review - 11/10/21 1343       Core Components/Risk Factors/Patient Goals Review   Personal Goals Review Weight Management/Obesity;Lipids;Stress;Diabetes;Improve shortness of breath with ADL's;Hypertension    Review AnLaxmias been doing well with exercise for her fitness level. Vital signs, CBG's and oxygen saturationa have been stable as AnNailaas been exercising on 2l/min of oxygen. Blood pressure has been better controlled. AnAnastaciaas not reported any chest pain. Weight unchanged.    Expected Outcomes AnIrishaill continue to participate in intensve cardiac rehab for exercise, nutrition and lifestyle modifications             ITP  Comments:  ITP Comments     Row Name 10/15/21 1519 10/20/21 0909 11/10/21 1340       ITP Comments  Dr Fransico Him Medical Director, Introduction to Pritikin Education Program/ Intensive Cardiac Rehab. Initial Orientation packet reviewed with the patient. 30 Day ITP Review. [place] started intensive cardiac rehab on 10/19/21. Emiya did fair with exercise as she is deconditoned. Akelia exercsed on 2l/min of oxygen and took frequent rest breaks 30 Day ITP Review. Jeanae has good participation when in attendance at intensive cardiac rehab. Saudia has been stable with exercise for her fitness level.              Comments: See ITP comments.

## 2021-11-10 NOTE — Telephone Encounter (Signed)
Spoke with Zakaiya she talked with Dr Sandi Mariscal and had a CT scan today. Will reach out to Dr [facility] office to find out if [name] is cleared to return to exercise.Barnet Pall, RN,BSN 11/10/2021 1:36 PM

## 2021-11-11 ENCOUNTER — Encounter (HOSPITAL_COMMUNITY): Payer: Medicare Other

## 2021-11-13 ENCOUNTER — Encounter (HOSPITAL_COMMUNITY): Payer: Medicare Other

## 2021-11-16 ENCOUNTER — Telehealth (HOSPITAL_COMMUNITY): Payer: Self-pay | Admitting: *Deleted

## 2021-11-16 ENCOUNTER — Encounter (HOSPITAL_COMMUNITY)
Admission: RE | Admit: 2021-11-16 | Discharge: 2021-11-16 | Disposition: A | Discharge status: Home / Self Care | Payer: Medicare Other | Source: Ambulatory Visit | Attending: Cardiology | Admitting: Cardiology

## 2021-11-16 DIAGNOSIS — Z9861 Coronary angioplasty status: Secondary | ICD-10-CM

## 2021-11-16 NOTE — Telephone Encounter (Signed)
Spoke with Sabriyah she plans to return to exercise today as Dr Lyndon Code her primary care has cleared her to return to exercise.Barnet Pall, RN,BSN 11/16/2021 9:23 AM

## 2021-11-18 ENCOUNTER — Encounter (HOSPITAL_COMMUNITY)
Admission: RE | Admit: 2021-11-18 | Discharge: 2021-11-18 | Disposition: A | Discharge status: Home / Self Care | Payer: Medicare Other | Source: Ambulatory Visit | Attending: Cardiology | Admitting: Cardiology

## 2021-11-18 DIAGNOSIS — Z9861 Coronary angioplasty status: Secondary | ICD-10-CM | POA: Diagnosis not present

## 2021-11-20 ENCOUNTER — Encounter (HOSPITAL_COMMUNITY): Payer: Medicare Other

## 2021-11-23 ENCOUNTER — Encounter (HOSPITAL_COMMUNITY)
Admission: RE | Admit: 2021-11-23 | Discharge: 2021-11-23 | Disposition: A | Discharge status: Home / Self Care | Payer: Medicare Other | Source: Ambulatory Visit | Attending: Cardiology | Admitting: Cardiology

## 2021-11-23 DIAGNOSIS — Z9861 Coronary angioplasty status: Secondary | ICD-10-CM

## 2021-11-24 ENCOUNTER — Telehealth: Payer: Self-pay | Admitting: Pharmacist

## 2021-11-24 MED ORDER — SEMAGLUTIDE (1 MG/DOSE) 4 MG/3ML ~~LOC~~ SOPN
1.0000 mg | PEN_INJECTOR | SUBCUTANEOUS | 1 refills | Status: DC
Start: 2021-11-24 — End: 2022-01-13

## 2021-11-24 NOTE — Telephone Encounter (Signed)
Spoke with patient. She had called to request refil on Ozmepic. Ready to increase to '1mg'$ . She states that on the first day or two her stomach is a little upset but overall doing well.   Has decreased insulin- spoke with Dr. Buddy Duty about decreasing insulin Decrease insulin by 10 units Will send in Rx for '1mg'$ . Pt will call when she is finished with the '1mg'$ .

## 2021-11-25 ENCOUNTER — Encounter (HOSPITAL_COMMUNITY)
Admission: RE | Admit: 2021-11-25 | Discharge: 2021-11-25 | Disposition: A | Discharge status: Home / Self Care | Payer: Medicare Other | Source: Ambulatory Visit | Attending: Cardiology | Admitting: Cardiology

## 2021-11-25 ENCOUNTER — Ambulatory Visit (HOSPITAL_COMMUNITY)
Admission: RE | Admit: 2021-11-25 | Discharge: 2021-11-25 | Disposition: A | Discharge status: Home / Self Care | Payer: Medicare Other | Source: Ambulatory Visit | Attending: Cardiology | Admitting: Cardiology

## 2021-11-25 DIAGNOSIS — I5022 Chronic systolic (congestive) heart failure: Secondary | ICD-10-CM | POA: Diagnosis present

## 2021-11-25 DIAGNOSIS — Z9861 Coronary angioplasty status: Secondary | ICD-10-CM

## 2021-11-25 LAB — BASIC METABOLIC PANEL
Anion gap: 13 (ref 5–15)
BUN: 14 mg/dL (ref 8–23)
CO2: 22 mmol/L (ref 22–32)
Calcium: 9.6 mg/dL (ref 8.9–10.3)
Chloride: 106 mmol/L (ref 98–111)
Creatinine, Ser: 0.86 mg/dL (ref 0.44–1.00)
GFR, Estimated: >60 mL/min (ref >60)
Glucose, Bld: 142 mg/dL — ABNORMAL HIGH (ref 70–99)
Potassium: 4.2 mmol/L (ref 3.5–5.1)
Sodium: 141 mmol/L (ref 135–145)

## 2021-11-26 ENCOUNTER — Other Ambulatory Visit (HOSPITAL_BASED_OUTPATIENT_CLINIC_OR_DEPARTMENT_OTHER): Payer: Self-pay | Admitting: Family Medicine

## 2021-11-26 ENCOUNTER — Telehealth (HOSPITAL_BASED_OUTPATIENT_CLINIC_OR_DEPARTMENT_OTHER): Payer: Self-pay

## 2021-11-26 DIAGNOSIS — R1030 Lower abdominal pain, unspecified: Secondary | ICD-10-CM

## 2021-11-26 DIAGNOSIS — Z1231 Encounter for screening mammogram for malignant neoplasm of breast: Secondary | ICD-10-CM

## 2021-11-26 DIAGNOSIS — M810 Age-related osteoporosis without current pathological fracture: Secondary | ICD-10-CM

## 2021-11-27 ENCOUNTER — Encounter (HOSPITAL_COMMUNITY): Payer: Medicare Other

## 2021-11-30 ENCOUNTER — Encounter (HOSPITAL_COMMUNITY): Payer: Medicare Other

## 2021-12-02 ENCOUNTER — Encounter (HOSPITAL_COMMUNITY)
Admission: RE | Admit: 2021-12-02 | Discharge: 2021-12-02 | Disposition: A | Discharge status: Home / Self Care | Payer: Medicare Other | Source: Ambulatory Visit | Attending: Cardiology | Admitting: Cardiology

## 2021-12-02 DIAGNOSIS — Z9861 Coronary angioplasty status: Secondary | ICD-10-CM | POA: Insufficient documentation

## 2021-12-04 ENCOUNTER — Encounter (HOSPITAL_COMMUNITY): Payer: Medicare Other

## 2021-12-07 ENCOUNTER — Encounter (HOSPITAL_COMMUNITY)
Admission: RE | Admit: 2021-12-07 | Discharge: 2021-12-07 | Disposition: A | Discharge status: Home / Self Care | Payer: Medicare Other | Source: Ambulatory Visit | Attending: Cardiology | Admitting: Cardiology

## 2021-12-07 DIAGNOSIS — Z9861 Coronary angioplasty status: Secondary | ICD-10-CM | POA: Diagnosis not present

## 2021-12-08 NOTE — Progress Notes (Signed)
Cardiac Individual Treatment Plan  Patient Details  Name: [name] [name] [name]: [id number] Date of Birth: Jun 09, 1945 Referring Provider:   Flowsheet Row INTENSIVE CARDIAC REHAB ORIENT from 10/15/2021 in [place]  Referring Provider Loralie Champagne, MD       Initial Encounter Date:  [place] from 10/15/2021 in [place]  Date 10/15/21       Visit Diagnosis: 04/20/21 S/P PTCA p RCA for ISR  Patient's Home Medications on Admission:  Current Outpatient Medications:    acetaminophen (TYLENOL) 500 MG tablet, Take 500 mg by mouth every 6 (six) hours as needed for mild pain, moderate pain, fever or headache., Disp: , Rfl:    albuterol (VENTOLIN HFA) 108 (90 Base) MCG/ACT inhaler, Inhale 2 puffs into the lungs every 4 (four) hours as needed for wheezing or shortness of breath. , Disp: , Rfl:    Alirocumab (PRALUENT) 150 MG/ML SOAJ, Inject 150 mg into the skin every 14 (fourteen) days., Disp: 6 mL, Rfl: 3   ALPRAZolam (XANAX) 0.5 MG tablet, Take 1-2 tablets (0.5-1 mg total) by mouth See admin instructions. Take one tablet (0.5 mg) by mouth every morning and two tablets (1 mg) at night, may also take 1 tablet (0.5 mg) midday as needed for anxiety, Disp: , Rfl:    amLODipine (NORVASC) 10 MG tablet, Take 10 mg by mouth at bedtime., Disp: , Rfl:    apixaban (ELIQUIS) 5 MG TABS tablet, Take 1 tablet (5 mg total) by mouth 2 (two) times daily., Disp: 180 tablet, Rfl: 1   B Complex-C (B-COMPLEX WITH VITAMIN C) tablet, Take 1 tablet by mouth in the morning., Disp: , Rfl:    bisacodyl (DULCOLAX) 5 MG EC tablet, Take 5 mg by mouth daily as needed for moderate constipation., Disp: , Rfl:    buPROPion (WELLBUTRIN XL) 150 MG 24 hr tablet, Take 150 mg by mouth every morning., Disp: , Rfl:    carboxymethylcellulose (REFRESH PLUS) 0.5 % SOLN, 1 drop in the morning, at noon, and at bedtime., Disp: , Rfl:     cholecalciferol (VITAMIN D3) 25 MCG (1000 UNIT) tablet, Take 1,000 Units by mouth in the morning., Disp: , Rfl:    clopidogrel (PLAVIX) 75 MG tablet, Take 75 mg by mouth every morning., Disp: , Rfl:    denosumab (PROLIA) 60 MG/ML SOSY injection, Inject 60 mg into the skin every 6 (six) months., Disp: , Rfl:    DULoxetine (CYMBALTA) 30 MG capsule, Take 1 capsule (30 mg total) by mouth daily., Disp: 30 capsule, Rfl: 0   ezetimibe (ZETIA) 10 MG tablet, Take 1 tablet (10 mg total) by mouth at bedtime., Disp: 90 tablet, Rfl: 3   fluocinonide cream (LIDEX) 0.93 %, Apply 1 application. topically 2 (two) times daily as needed (irritation)., Disp: , Rfl:    Fluticasone-Salmeterol (ADVAIR) 250-50 MCG/DOSE AEPB, Inhale 1 puff into the lungs daily., Disp: , Rfl:    gabapentin (NEURONTIN) 600 MG tablet, Take 600-1,200 mg by mouth See admin instructions. Take 1 tablet (600 mg) by mouth in the morning & take 2 tablets  (1200 mg) by mouth at bedtime, Disp: , Rfl:    Insulin Glargine (LANTUS SOLOSTAR) 100 UNIT/ML Solostar Pen, Inject 28-44 Units into the skin at bedtime. Per sliding scale, Disp: , Rfl:    insulin lispro (HUMALOG) 100 UNIT/ML KwikPen, Inject 14-20 Units into the skin See admin instructions. Inject 14 units subcutaneously prior to breakfast and supper; add  4 units for CBG >200, Disp: , Rfl:    ipratropium-albuterol (DUONEB) 0.5-2.5 (3) MG/3ML SOLN, Take 3 mLs by nebulization 2 (two) times daily as needed (asthma)., Disp: , Rfl:    loperamide (IMODIUM A-D) 2 MG tablet, Take 2 mg by mouth 4 (four) times daily as needed for diarrhea or loose stools., Disp: , Rfl:    methocarbamol (ROBAXIN) 500 MG tablet, Take 500 mg by mouth See admin instructions. Take 1 tablet (500 mg) by mouth scheduled at bedtime & may take 2 additional doses during the day if needed for back pain., Disp: , Rfl:    Multiple Vitamin (MULTIVITAMIN WITH MINERALS) TABS tablet, Take 1 tablet by mouth every morning. Centrum - Women over 28,  Disp: , Rfl:    Multiple Vitamins-Minerals ([place]) CAPS, Take 1 capsule by mouth every morning., Disp: , Rfl:    nitroGLYCERIN (NITROSTAT) 0.3 MG SL tablet, Place 1 tablet (0.3 mg total) under the tongue every 5 (five) minutes x 3 doses as needed for chest pain., Disp: 25 tablet, Rfl: 1   ondansetron (ZOFRAN-ODT) 4 MG disintegrating tablet, Take 4 mg by mouth 2 (two) times daily as needed for nausea or vomiting., Disp: , Rfl:    OXYGEN, Inhale 2 L into the lungs at bedtime as needed (shortness of breath). , Disp: , Rfl:    pantoprazole (PROTONIX) 40 MG tablet, Take 1 tablet (40 mg total) by mouth daily., Disp: 30 tablet, Rfl: 0   Pyridoxine HCl (VITAMIN B-6 PO), Take 1 tablet by mouth every morning., Disp: , Rfl:    ranolazine (RANEXA) 1000 MG SR tablet, TAKE ONE TABLET BY MOUTH TWICE DAILY, Disp: 180 tablet, Rfl: 3   rosuvastatin (CRESTOR) 40 MG tablet, Take 1 tablet (40 mg total) by mouth daily. (Patient taking differently: Take 40 mg by mouth at bedtime.), Disp: 90 tablet, Rfl: 3   sacubitril-valsartan (ENTRESTO) 97-103 MG, Take 1 tablet by mouth 2 (two) times daily., Disp: 180 tablet, Rfl: 3   Semaglutide, 1 MG/DOSE, 4 MG/3ML SOPN, Inject 1 mg into the skin once a week., Disp: 3 mL, Rfl: 1   spironolactone (ALDACTONE) 25 MG tablet, Take 25 mg by mouth daily., Disp: , Rfl:    torsemide (DEMADEX) 20 MG tablet, Take 0.5 tablets (10 mg total) by mouth daily., Disp: 15 tablet, Rfl: 2   traZODone (DESYREL) 50 MG tablet, Take 1 tablet (50 mg total) by mouth at bedtime., Disp: 90 tablet, Rfl: 1   vitamin B-12 (CYANOCOBALAMIN) 1000 MCG tablet, Take 1,000 mcg by mouth every morning. , Disp: , Rfl:    zolpidem (AMBIEN) 10 MG tablet, Take 10 mg by mouth at bedtime., Disp: , Rfl:   Past Medical History: Past Medical History:  Diagnosis Date   Anemia    hx   Anxiety    Asthma    Basal cell carcinoma 05/2014   "left shoulder"   Bundle branch block, left    chronic/notes 07/18/2013    CHF (congestive heart failure) (HCC)    Chronic insomnia 05/06/2015   Chronic kidney disease    frequency, sees dr Jamal Maes every 4 to 6 months (01/16/2018)   Chronic lower back pain    Claustrophobia    Common migraine 05/14/2014   Coronary artery disease    MI in 2001, 2002, 2006, 2011, 2014   Depression    Diabetic peripheral neuropathy ([place]) 01/12/2019   GERD (gastroesophageal reflux disease)    H/O hiatal hernia    Headache    "  at least 2/month" (01/16/2018)   Heart murmur    Hyperlipidemia    Hypertension    Memory change 05/14/2014   Migraine    "5-6/year"  (01/16/2018)   Obesity 01-2010   Obstructive sleep apnea    "can't wear machine; I have claustrophobia" (01/16/2018), states she had a 2nd sleep study and does not have sleep apnea, her O2 decreases and now is on 2 L of O2 at night.   On home oxygen therapy    "2L at night and prn during daytime" (01/16/2018)   Osteoarthritis    "knees and hands" (01/16/2018)   Peripheral vascular disease (HCC)    ? numbness, tingling arms and legs   PONV (postoperative nausea and vomiting)    Stroke ([place])    2014, 2015, 2016   Type II diabetes mellitus ([place])    Ventral hernia    hx of    Tobacco Use: Social History   Tobacco Use  Smoking Status Never  Smokeless Tobacco Never    Labs: Review Flowsheet  More data exists      Latest Ref Rng & Units 01/17/2019 06/06/2019 05/13/2020 03/31/2021 04/20/2021  Labs for ITP Cardiac and Pulmonary Rehab  Cholestrol 0 - 200 mg/dL - 131  121  173  -  LDL (calc) 0 - 99 mg/dL - 57  58  102  -  HDL-C >40 mg/dL - 57  51  58  -  Trlycerides <150 mg/dL - 83  58  65  -  Hemoglobin A1c 4.8 - 5.6 % 7.2  - - - -  Bicarbonate 20.0 - 28.0 mmol/L 20.0 - 28.0 mmol/L - - - - 27.2  27.5   TCO2 22 - 32 mmol/L 22 - 32 mmol/L - - - - 29  29   O2 Saturation % % - - - - 79  80     Capillary Blood Glucose: Lab Results  Component Value Date   GLUCAP 121 (H) 10/21/2021   GLUCAP 120 (H)  10/21/2021   GLUCAP 161 (H) 10/19/2021   GLUCAP 144 (H) 10/19/2021   GLUCAP 196 (H) 06/02/2021     Exercise Target Goals: Exercise Program Goal: Individual exercise prescription set using results from initial 6 min walk test and THRR while considering  patient's activity barriers and safety.   Exercise Prescription Goal: Initial exercise prescription builds to 30-45 minutes a day of aerobic activity, 2-3 days per week.  Home exercise guidelines will be given to patient during program as part of exercise prescription that the participant will acknowledge.  Activity Barriers & Risk Stratification:  Activity Barriers & Cardiac Risk Stratification - 10/15/21 1439       Activity Barriers & Cardiac Risk Stratification   Activity Barriers Arthritis;Back Problems;Neck/Spine Problems;Joint Problems;Deconditioning;Muscular Weakness;Shortness of Breath;Decreased Ventricular Function;Chest Pain/Angina;Balance Concerns;History of Falls;Assistive Device    Cardiac Risk Stratification High             6 Minute Walk:  6 Minute Walk     Row Name 10/15/21 1413         6 Minute Walk   Phase Initial  Perfomed on a Nustep     Distance 1115 feet     Walk Time 6 minutes     # of Rest Breaks 1  From 3:00 to 3:49     MPH 2.11     METS 1.3     RPE 11     Perceived Dyspnea  2     VO2 Peak 4.61  Symptoms Yes (comment)     Comments left knee pain 2/10, low back ppain 4/10, angina(chest pressure) 2/10     Resting HR 58 bpm     Resting BP 149/80     Resting Oxygen Saturation  96 %     Exercise Oxygen Saturation  during 6 min walk 95 %     Max Ex. HR 57 bpm     Max Ex. BP 170/74     2 Minute Post BP 154/80  Exit BP was 118/80       Interval Oxygen   Interval Oxygen? Yes     Baseline Oxygen Saturation % 96 %     1 Minute Oxygen Saturation % 96 %     1 Minute Liters of Oxygen 0 L     2 Minute Oxygen Saturation % 97 %     2 Minute Liters of Oxygen 0 L     3 Minute Oxygen Saturation % 96  %     3 Minute Liters of Oxygen 0 L     4 Minute Oxygen Saturation % 98 %     4 Minute Liters of Oxygen 0 L     5 Minute Oxygen Saturation % 97 %     5 Minute Liters of Oxygen 0 L     6 Minute Oxygen Saturation % 97 %     6 Minute Liters of Oxygen 0 L     2 Minute Post Oxygen Saturation % 96 %     2 Minute Post Liters of Oxygen 0 L              Oxygen Initial Assessment:   Oxygen Re-Evaluation:   Oxygen Discharge (Final Oxygen Re-Evaluation):   Initial Exercise Prescription:  Initial Exercise Prescription - 10/15/21 1400       Date of Initial Exercise RX and Referring Provider   Date 10/15/21    Referring Provider Loralie Champagne, MD    Expected Discharge Date 12/11/21      NuStep   Level 1    SPM 70    Minutes 20    METs 1.3      Prescription Details   Frequency (times per week) 2    Duration Progress to 30 minutes of continuous aerobic without signs/symptoms of physical distress      Intensity   THRR 40-80% of Max Heartrate 59-118    Ratings of Perceived Exertion 11-13    Perceived Dyspnea 0-4      Progression   Progression Continue progressive overload as per policy without signs/symptoms or physical distress.      Resistance Training   Training Prescription Yes    Weight 1 lb    Reps 10-15             Perform Capillary Blood Glucose checks as needed.  Exercise Prescription Changes:   Exercise Prescription Changes     Row Name 10/19/21 1400 11/04/21 1430 11/18/21 1300 12/07/21 1500       Response to Exercise   Blood Pressure (Admit) 138/60 112/70 120/78 144/72    Blood Pressure (Exercise) 142/78 118/72 122/72 130/80    Blood Pressure (Exit) 106/58 108/76 118/76 124/70    Heart Rate (Admit) 74 bpm 58 bpm 60 bpm 63 bpm    Heart Rate (Exercise) 78 bpm 73 bpm 70 bpm 81 bpm    Heart Rate (Exit) 59 bpm 58 bpm 54 bpm 51 bpm    Oxygen Saturation (Admit) -- -- -- 96 %  Oxygen Saturation (Exercise) 95 %  O2 - 2 L/min via n/c 97 % -- 96 %     Oxygen Saturation (Exit) 96 % 98 % 99 % 99 %    Rating of Perceived Exertion (Exercise) 11 97 98 11    Perceived Dyspnea (Exercise) 1 1 0 1    Symptoms Nausea during warm-up Right hip pain 6/10 Right hip pain 4/10 SOB RPD = 1    Comments Pt's first day in the [facility] Reviewed METs Reviewed METs and goals Reviewed goals, mets, home exercise Rx    Duration Progress to 30 minutes of  aerobic without signs/symptoms of physical distress Continue with 30 min of aerobic exercise without signs/symptoms of physical distress. Continue with 30 min of aerobic exercise without signs/symptoms of physical distress. Continue with 30 min of aerobic exercise without signs/symptoms of physical distress.    Intensity THRR unchanged THRR unchanged THRR unchanged THRR unchanged      Progression   Progression Continue to progress workloads to maintain intensity without signs/symptoms of physical distress. Continue to progress workloads to maintain intensity without signs/symptoms of physical distress. Continue to progress workloads to maintain intensity without signs/symptoms of physical distress. Continue to progress workloads to maintain intensity without signs/symptoms of physical distress.    Average METs 1.6 2 1.9 2.3      Resistance Training   Training Prescription Yes No No Yes    Weight 1 lb No weights on Wednesdays No weights on Wednesdays 1 lb    Reps 10-15 -- -- 10-15    Time 10 Minutes -- -- 10 Minutes      Interval Training   Interval Training No -- -- No      Oxygen   Oxygen Continuous Continuous Continuous Continuous    Liters '2 2 2 2      '$ NuStep   Level '1 1 1 1    '$ Minutes 20 30 35 30    METs 1.6 2 1.9 2.3      Home Exercise Plan   Plans to continue exercise at -- -- -- Home (comment)    Frequency -- -- -- Add 4 additional days to program exercise sessions.    Initial Home Exercises Provided -- -- -- 12/07/21      Oxygen   Maintain Oxygen Saturation -- 88% or higher 88% or higher 88% or  higher             Exercise Comments:   Exercise Comments     Row Name 10/19/21 1437 11/05/21 0948 11/18/21 1406 12/07/21 1601     Exercise Comments Pt's first day in the CRP2 program. Patient uses rolling walker. The patient is deconditioned and did warm up, weights and cool down from chair. Pt reports some nausea during the warm-up. Pt took a break and it subsided. Pt placed on O2 via n/c @ 2 L/min.  Pt was able to complete 20 minutes on the Nustep by taking breaks every 3-4 minutes.  Patient had one-to-one instruction on warm-up, cool-down, and weights. Pt will require slow progression. Reviewed METs. Pt is progressing and is now able to complete 30 minutes on the nustep. Pt is no longer taking breaks during her exercise, but is encouraged to do so if needed. Reviewed METs and goals. Pt completed 35 minutes on nustep today. Pt continues with chronic right hip pain. Reviewed METs, goals and home exercise RX. Pt voices that she has been using a stepper at home for 20-30 minutes as well as  using a 2 lb bar for upperbody strengthing exercises. Pt will continue her exercise at home on her off days from the program. Pt verbalized understanding of the home exercise Rx and was provided a copy.             Exercise Goals and Review:   Exercise Goals     Row Name 10/15/21 1434             Exercise Goals   Increase Physical Activity Yes       Intervention Provide advice, education, support and counseling about physical activity/exercise needs.;Develop an individualized exercise prescription for aerobic and resistive training based on initial evaluation findings, risk stratification, comorbidities and participant's personal goals.       Expected Outcomes Short Term: Attend rehab on a regular basis to increase amount of physical activity.;Long Term: Add in home exercise to make exercise part of routine and to increase amount of physical activity.;Long Term: Exercising regularly at least 3-5  days a week.       Increase Strength and Stamina Yes       Intervention Develop an individualized exercise prescription for aerobic and resistive training based on initial evaluation findings, risk stratification, comorbidities and participant's personal goals.;Provide advice, education, support and counseling about physical activity/exercise needs.       Expected Outcomes Short Term: Increase workloads from initial exercise prescription for resistance, speed, and METs.;Short Term: Perform resistance training exercises routinely during rehab and add in resistance training at home;Long Term: Improve cardiorespiratory fitness, muscular endurance and strength as measured by increased METs and functional capacity (6MWT)       Able to understand and use rate of perceived exertion (RPE) scale Yes       Intervention Provide education and explanation on how to use RPE scale       Expected Outcomes Short Term: Able to use RPE daily in rehab to express subjective intensity level;Long Term:  Able to use RPE to guide intensity level when exercising independently       Knowledge and understanding of Target Heart Rate Range (THRR) Yes       Intervention Provide education and explanation of THRR including how the numbers were predicted and where they are located for reference       Expected Outcomes Short Term: Able to state/look up THRR;Short Term: Able to use daily as guideline for intensity in rehab;Long Term: Able to use THRR to govern intensity when exercising independently       Understanding of Exercise Prescription Yes       Intervention Provide education, explanation, and written materials on patient's individual exercise prescription       Expected Outcomes Short Term: Able to explain program exercise prescription;Long Term: Able to explain home exercise prescription to exercise independently                Exercise Goals Re-Evaluation :  Exercise Goals Re-Evaluation     Row Name 10/19/21 1434  10/19/21 1435 11/18/21 1400 12/07/21 1559       Exercise Goal Re-Evaluation   Exercise Goals Review Increase Physical Activity;Increase Strength and Stamina;Able to understand and use rate of perceived exertion (RPE) scale;Knowledge and understanding of Target Heart Rate Range (THRR);Understanding of Exercise Prescription (P)  Increase Physical Activity;Increase Strength and Stamina;Able to understand and use rate of perceived exertion (RPE) scale;Knowledge and understanding of Target Heart Rate Range (THRR);Understanding of Exercise Prescription Increase Physical Activity;Increase Strength and Stamina;Able to understand and use rate of perceived exertion (  RPE) scale;Knowledge and understanding of Target Heart Rate Range (THRR);Understanding of Exercise Prescription Increase Physical Activity;Increase Strength and Stamina;Able to understand and use rate of perceived exertion (RPE) scale;Knowledge and understanding of Target Heart Rate Range (THRR);Understanding of Exercise Prescription    Comments Pt (P)  Pt's first day in the [facility]2 program. Pt understands the exercise Rx, THRR, and RPE scale. Reviewed METs and goals. Pt had peak METs of 1.9 today. Pt is cominmg back after a fall at home and is making progress to recapture her gains. Pt voices that her stregth and stamina have not improved and has been set back a bit due to her fall. Reviewed METs, goals and home exercise RX. Pt voices that she has made some gains in strength and stamina but she feels the fall at home has set her back a bit. Peak METs 2.3.    Expected Outcomes -- Will continue to monitor patient and progress exercise workloads as tolerated. Will continue to monitor patient and progress exercise workloads as tolerated. Will continue to monitor patient and progress exercise workloads as tolerated.             Discharge Exercise Prescription (Final Exercise Prescription Changes):  Exercise Prescription Changes - 12/07/21 1500        Response to Exercise   Blood Pressure (Admit) 144/72    Blood Pressure (Exercise) 130/80    Blood Pressure (Exit) 124/70    Heart Rate (Admit) 63 bpm    Heart Rate (Exercise) 81 bpm    Heart Rate (Exit) 51 bpm    Oxygen Saturation (Admit) 96 %    Oxygen Saturation (Exercise) 96 %    Oxygen Saturation (Exit) 99 %    Rating of Perceived Exertion (Exercise) 11    Perceived Dyspnea (Exercise) 1    Symptoms SOB RPD = 1    Comments Reviewed goals, mets, home exercise Rx    Duration Continue with 30 min of aerobic exercise without signs/symptoms of physical distress.    Intensity THRR unchanged      Progression   Progression Continue to progress workloads to maintain intensity without signs/symptoms of physical distress.    Average METs 2.3      Resistance Training   Training Prescription Yes    Weight 1 lb    Reps 10-15    Time 10 Minutes      Interval Training   Interval Training No      Oxygen   Oxygen Continuous    Liters 2      NuStep   Level 1    Minutes 30    METs 2.3      Home Exercise Plan   Plans to continue exercise at Home (comment)    Frequency Add 4 additional days to program exercise sessions.    Initial Home Exercises Provided 12/07/21      Oxygen   Maintain Oxygen Saturation 88% or higher             Nutrition:  Target Goals: Understanding of nutrition guidelines, daily intake of sodium '1500mg'$ , cholesterol '200mg'$ , calories 30% from fat and 7% or less from saturated fats, daily to have 5 or more servings of fruits and vegetables.  Biometrics:  Pre Biometrics - 10/15/21 1105       Pre Biometrics   Waist Circumference 46 inches    Hip Circumference 58.5 inches    Waist to Hip Ratio 0.79 %    Triceps Skinfold 45 mm    % Body Fat  54.5 %    Grip Strength 18 kg    Flexibility --   Not performed due to low back poroblems   Single Leg Stand --   Uses walker, h/o of falls; not performed             Nutrition Therapy Plan and Nutrition  Goals:  Nutrition Therapy & Goals - 11/16/21 1611       Nutrition Therapy   Diet Heart Healthy Diet    Drug/Food Interactions Statins/Certain Fruits      Personal Nutrition Goals   Nutrition Goal Patient to identify strategies for managing cardiovascular risk by attending the weekly [facility] education and nutrition courses.    Personal Goal #2 Patient to identify strategies for weight loss of 0.5-2.0# per week of weight loss    Personal Goal #3 Patient to limit to '1500mg'$  of sodium daily    Personal Goal #4 Patient to identify food sources and limit daily intake of saturated fat, trans fat, sodium, and refined carbohydrates.    Comments Goals in progress. [name] [name] reports focusing on increasing fruit and vegetable intake. She is down 4.4# since starting with our program. She does reports issues with early satiety and nausea; she does continue Ozempic but also reports lengthy GI history including gastroparesis, acid reflux, bowel resection, etc. Will encourage follow-up with GI due to ongoing symptoms.      Intervention Plan   Intervention Prescribe, educate and counsel regarding individualized specific dietary modifications aiming towards targeted core components such as weight, hypertension, lipid management, diabetes, heart failure and other comorbidities.;Nutrition handout(s) given to patient.    Expected Outcomes Short Term Goal: Understand basic principles of dietary content, such as calories, fat, sodium, cholesterol and nutrients.;Long Term Goal: Adherence to prescribed nutrition plan.             Nutrition Assessments:  Nutrition Assessments - 10/19/21 1423       Rate Your Plate Scores   Pre Score 78            MEDIFICTS Score Key: ?70 Need to make dietary changes  40-70 Heart Healthy Diet ? 40 Therapeutic Level Cholesterol Diet   Flowsheet Row INTENSIVE CARDIAC REHAB from 10/19/2021 in [facility]  Picture Your Plate Total Score on  Admission 78      Picture Your Plate Scores: <10 Unhealthy dietary pattern with much room for improvement. 41-50 Dietary pattern unlikely to meet recommendations for good health and room for improvement. 51-60 More healthful dietary pattern, with some room for improvement.  >60 Healthy dietary pattern, although there may be some specific behaviors that could be improved.    Nutrition Goals Re-Evaluation:  Nutrition Goals Re-Evaluation     [place] Name 10/19/21 1415 11/16/21 1611           Goals   Current Weight 232 lb 2.3 oz (105.3 kg) 226 lb 6.6 oz (102.7 kg)      Comment LDL 102, A1c 6.3 No new labs, most recent labs show LDL 102, A1c 6.3      Expected Outcome Patient reports that she has completed cardiac rehab previously. She reports highest adult weight is 330#; she is prescribed Ozempic for weight loss. Her husband does the grocery shopping and cooking; he is supporitve of making lifestyle/dietary changes. She does report meal skipping and denies snacking. Goals in progress. She continues to attend the [facility] education series. [name] [name] reports focusing on increasing fruit and vegetable intake. She is down 4.4# since starting with  our program. She does reports issues with early satiety and nausea; she does continue Ozempic but also reports lengthy GI history including gastroparesis, acid reflux, bowel resection, etc. Will encourage follow-up with GI due to ongoing symptoms.               Nutrition Goals Re-Evaluation:  Nutrition Goals Re-Evaluation     [place] Name 10/19/21 1415 11/16/21 1611           Goals   Current Weight 232 lb 2.3 oz (105.3 kg) 226 lb 6.6 oz (102.7 kg)      Comment LDL 102, A1c 6.3 No new labs, most recent labs show LDL 102, A1c 6.3      Expected Outcome Patient reports that she has completed cardiac rehab previously. She reports highest adult weight is 330#; she is prescribed Ozempic for weight loss. Her husband does the grocery shopping and cooking; he is  supporitve of making lifestyle/dietary changes. She does report meal skipping and denies snacking. Goals in progress. She continues to attend the Pritikin education series. Veora reports focusing on increasing fruit and vegetable intake. She is down 4.4# since starting with our program. She does reports issues with early satiety and nausea; she does continue Ozempic but also reports lengthy GI history including gastroparesis, acid reflux, bowel resection, etc. Will encourage follow-up with GI due to ongoing symptoms.               Nutrition Goals Discharge (Final Nutrition Goals Re-Evaluation):  Nutrition Goals Re-Evaluation - 11/16/21 1611       Goals   Current Weight 226 lb 6.6 oz (102.7 kg)    Comment No new labs, most recent labs show LDL 102, A1c 6.3    Expected Outcome Goals in progress. She continues to attend the Pritikin education series. Shanyiah reports focusing on increasing fruit and vegetable intake. She is down 4.4# since starting with our program. She does reports issues with early satiety and nausea; she does continue Ozempic but also reports lengthy GI history including gastroparesis, acid reflux, bowel resection, etc. Will encourage follow-up with GI due to ongoing symptoms.             Psychosocial: Target Goals: Acknowledge presence or absence of significant depression and/or stress, maximize coping skills, provide positive support system. Participant is able to verbalize types and ability to use techniques and skills needed for reducing stress and depression.  Initial Review & Psychosocial Screening:  Initial Psych Review & Screening - 10/15/21 1531       Initial Review   Current issues with Current Stress Concerns;History of Depression    Source of Stress Concerns Chronic Illness;Unable to participate in former interests or hobbies;Unable to perform yard/household activities;Family    Comments History of Depression, Mother is under hospice care. Daughter has  lupus.      Family Dynamics   Good Support System? Yes   Bobbye has her huband, daughter and sister for support.     Barriers   Psychosocial barriers to participate in program The patient should benefit from training in stress management and relaxation.      Screening Interventions   Interventions Encouraged to exercise;To provide support and resources with identified psychosocial needs;Provide feedback about the scores to participant    Expected Outcomes Long Term Goal: Stressors or current issues are controlled or eliminated.;Short Term goal: Identification and review with participant of any Quality of Life or Depression concerns found by scoring the questionnaire.;Long Term goal: The participant improves quality of  Life and PHQ9 Scores as seen by post scores and/or verbalization of changes             Quality of Life Scores:  Quality of Life - 10/15/21 1408       Quality of Life   Select Quality of Life      Quality of Life Scores   Health/Function Pre 7.2 %    Socioeconomic Pre 24.79 %    Psych/Spiritual Pre 9.86 %    Family Pre 26.4 %    GLOBAL Pre 14.19 %            Scores of 19 and below usually indicate a poorer quality of life in these areas.  A difference of  2-3 points is a clinically meaningful difference.  A difference of 2-3 points in the total score of the Quality of Life Index has been associated with significant improvement in overall quality of life, self-image, physical symptoms, and general health in studies assessing change in quality of life.  PHQ-9: Review Flowsheet  More data exists      10/26/2021 10/15/2021 10/12/2018 10/28/2016 09/29/2016  Depression screen PHQ 2/9  Decreased Interest '3 1 2 '$ 0 0  Down, Depressed, Hopeless 3 0 1 - 0  PHQ - 2 Score '6 1 3 '$ 0 0  Altered sleeping 1 - 1 - -  Tired, decreased energy 3 - 1 - -  Change in appetite 0 - 0 - -  Feeling bad or failure about yourself  3 - 1 - -  Trouble concentrating 2 - 1 - -  Moving slowly  or fidgety/restless 1 - 0 - -  Suicidal thoughts 0 - 0 - -  PHQ-9 Score 16 - 7 - -  Difficult doing work/chores Very difficult - Somewhat difficult - -   Interpretation of Total Score  Total Score Depression Severity:  1-4 = Minimal depression, 5-9 = Mild depression, 10-14 = Moderate depression, 15-19 = Moderately severe depression, 20-27 = Severe depression   Psychosocial Evaluation and Intervention:   Psychosocial Re-Evaluation:  Psychosocial Re-Evaluation     [place] Name 10/20/21 0920 11/10/21 1342 12/08/21 1811         Psychosocial Re-Evaluation   Current issues with Current Stress Concerns;Current Depression;History of Depression Current Stress Concerns;Current Depression;History of Depression Current Stress Concerns;Current Depression;History of Depression     Comments Reviewed quality of life admits to being depressed will forward to Dr Serita Grammes has not voiced an increase in depression. Nailea is to discuss counseling with Dr Sandi Mariscal at her upcoming follow up appointment wiht him. Chasady has not voiced an increase in depression. Discussed with dr Sandi Mariscal at office visit     Expected Outcomes [name] [name] will have decreased or controlled depression upon completion of intensive cardiac rehab [name] [name] will have decreased or controlled depression upon completion of intensive cardiac rehab [name] [name] will have decreased or controlled depression upon completion of intensive cardiac rehab     Interventions Physician referral;Encouraged to attend Cardiac Rehabilitation for the exercise;Stress management education Physician referral;Encouraged to attend Cardiac Rehabilitation for the exercise;Stress management education Physician referral;Encouraged to attend Cardiac Rehabilitation for the exercise;Stress management education     Continue Psychosocial Services  Follow up required by staff Follow up required by staff Follow up required by staff       Initial Review   Source of Stress Concerns  Chronic Illness;Poor Coping Skills;Unable to perform yard/household activities;Unable to participate in former interests or hobbies;Family Chronic Illness;Poor Coping Skills;Unable to perform yard/household activities;Unable  to participate in former interests or hobbies;Family Chronic Illness;Poor Coping Skills;Unable to perform yard/household activities;Unable to participate in former interests or hobbies;Family     Comments Will continue to monitor and offer support as needed. Will continue to monitor and offer support as needed. Will continue to monitor and offer support as needed.              Psychosocial Discharge (Final Psychosocial Re-Evaluation):  Psychosocial Re-Evaluation - 12/08/21 1811       Psychosocial Re-Evaluation   Current issues with Current Stress Concerns;Current Depression;History of Depression    Comments [name] [name] has not voiced an increase in depression. Discussed with dr Sandi Mariscal at office visit    Expected Outcomes [name] [name] will have decreased or controlled depression upon completion of intensive cardiac rehab    Interventions Physician referral;Encouraged to attend Cardiac Rehabilitation for the exercise;Stress management education    Continue Psychosocial Services  Follow up required by staff      Initial Review   Source of Stress Concerns Chronic Illness;Poor Coping Skills;Unable to perform yard/household activities;Unable to participate in former interests or hobbies;Family    Comments Will continue to monitor and offer support as needed.             Vocational Rehabilitation: Provide vocational rehab assistance to qualifying candidates.   Vocational Rehab Evaluation & Intervention:  Vocational Rehab - 10/15/21 1537       Initial Vocational Rehab Evaluation & Intervention   Assessment shows need for Vocational Rehabilitation No   Taitum is retired and does not need vocational rehab at this time            Education: Education Goals: Education  classes will be provided on a weekly basis, covering required topics. Participant will state understanding/return demonstration of topics presented.    Education     [place] Name 10/19/21 1500     Education   Cardiac Education Topics Pritikin   Environmental consultant Exercise   Exercise Workshop Exercise Basics: Building Your Action Plan   Instruction Review Code 1- Verbalizes Understanding   Class Start Time 1411   Class Stop Time 1451   Class Time Calculation (min) 40 min    [place] Name 10/26/21 1500     Education   Cardiac Education Topics Pritikin   Lexicographer Nutrition   Nutrition Nutrition Action Plan   Instruction Review Code 1- Verbalizes Understanding   Class Start Time 1400   Class Stop Time 1450   Class Time Calculation (min) 50 min    [place] Name 10/28/21 1500     Education   Cardiac Education Topics Pritikin   Financial trader   Weekly Topic Simple Sides and Sauces   Instruction Review Code 1- Verbalizes Understanding   Class Start Time 1359   Class Stop Time 1440   Class Time Calculation (min) 41 min    [place] Name 11/02/21 1500     Education   Cardiac Education Topics Pritikin   Lexicographer Nutrition   Nutrition Dining Out - Part 1   Instruction Review Code 1- Verbalizes Understanding   Class Start Time 1358   Class Stop Time 1445   Class Time Calculation (min) 47 min  [place] Name 11/04/21 1500     Education   Cardiac Education Topics [employer]   Educator Dietitian   Weekly Topic One-Pot Wonders   Instruction Review Code 1- Verbalizes Understanding   Class Start Time 2694   Class Stop Time 1440   Class Time Calculation (min) 53 min    [place] Name 11/16/21 1500     Education   Cardiac Education Topics  Pritikin   IT sales professional Nutrition   Nutrition Workshop Fueling a Designer, multimedia   Instruction Review Code 1- Information systems manager   Class Start Time 1355   Class Stop Time 1450   Class Time Calculation (min) 55 min    [place] Name 11/18/21 1500     Education   Cardiac Education Topics Pritikin   Financial trader   Weekly Topic Adding Flavor - Sodium-Free   Instruction Review Code 1- Verbalizes Understanding   Class Start Time 1355   Class Stop Time 1440   Class Time Calculation (min) 45 min    [place] Name 11/23/21 1500     Education   Cardiac Education Topics Pritikin   [employer] Workshops     Workshops   Educator Exercise Physiologist   Select Psychosocial   Psychosocial Workshop Recognizing and Reducing Stress   Instruction Review Code 1- Verbalizes Understanding   Class Start Time 1350   Class Stop Time 1448   Class Time Calculation (min) 58 min    [place] Name 11/25/21 1500     Education   Cardiac Education Topics Pritikin   Financial trader   Weekly Topic Fast and Healthy Breakfasts   Instruction Review Code 1- Verbalizes Understanding   Class Start Time 1350   Class Stop Time 1434   Class Time Calculation (min) 44 min    [place] Name 12/02/21 1500     Education   Cardiac Education Topics Pritikin   Financial trader   Weekly Topic Personalizing Your Pritikin Plate   Instruction Review Code 1- Verbalizes Understanding   Class Start Time 8546   Class Stop Time 1435   Class Time Calculation (min) 40 min    [place] Name 12/07/21 1500     Education   Cardiac Education Topics Pritikin   IT sales professional Nutrition   Nutrition Workshop Label Reading   Instruction Review Code 1- Information systems manager   Class Start Time 1355   Class  Stop Time 1445   Class Time Calculation (min) 50 min            Core Videos: Exercise    Move It!  Clinical staff conducted group or individual video education with verbal and written material and guidebook.  Patient learns the recommended Pritikin exercise program. Exercise with the goal of living a long, healthy life. Some of the health benefits of exercise include controlled diabetes, healthier blood pressure levels, improved cholesterol levels, improved heart and lung capacity, improved sleep, and better body composition. Everyone should speak with their doctor before starting or changing an exercise routine.  Biomechanical Limitations Clinical staff conducted group or individual video education with verbal and written material and guidebook.  Patient learns  how biomechanical limitations can impact exercise and how we can mitigate and possibly overcome limitations to have an impactful and balanced exercise routine.  Body Composition Clinical staff conducted group or individual video education with verbal and written material and guidebook.  Patient learns that body composition (ratio of muscle mass to fat mass) is a key component to assessing overall fitness, rather than body weight alone. Increased fat mass, especially visceral belly fat, can put [place] at increased risk for metabolic syndrome, type 2 diabetes, heart disease, and even death. It is recommended to combine diet and exercise (cardiovascular and resistance training) to improve your body composition. Seek guidance from your physician and exercise physiologist before implementing an exercise routine.  Exercise Action Plan Clinical staff conducted group or individual video education with verbal and written material and guidebook.  Patient learns the recommended strategies to achieve and enjoy long-term exercise adherence, including variety, self-motivation, self-efficacy, and positive decision making. Benefits of exercise include  fitness, good health, weight management, more energy, better sleep, less stress, and overall well-being.  Medical   Heart Disease Risk Reduction Clinical staff conducted group or individual video education with verbal and written material and guidebook.  Patient learns our heart is our most vital organ as it circulates oxygen, nutrients, white blood cells, and hormones throughout the entire body, and carries waste away. Data supports a plant-based eating plan like the Pritikin Program for its effectiveness in slowing progression of and reversing heart disease. The video provides a number of recommendations to address heart disease.   Metabolic Syndrome and Belly Fat  Clinical staff conducted group or individual video education with verbal and written material and guidebook.  Patient learns what metabolic syndrome is, how it leads to heart disease, and how one can reverse it and keep it from coming back. You have metabolic syndrome if you have 3 of the following 5 criteria: abdominal obesity, high blood pressure, high triglycerides, low HDL cholesterol, and high blood sugar.  Hypertension and Heart Disease Clinical staff conducted group or individual video education with verbal and written material and guidebook.  Patient learns that high blood pressure, or hypertension, is very common in the [place]. Hypertension is largely due to excessive salt intake, but other important risk factors include being overweight, physical inactivity, drinking too much alcohol, smoking, and not eating enough potassium from fruits and vegetables. High blood pressure is a leading risk factor for heart attack, stroke, congestive heart failure, dementia, kidney failure, and premature death. Long-term effects of excessive salt intake include stiffening of the arteries and thickening of heart muscle and organ damage. Recommendations include ways to reduce hypertension and the risk of heart disease.  Diseases of Our Time  - Focusing on Diabetes Clinical staff conducted group or individual video education with verbal and written material and guidebook.  Patient learns why the best way to stop diseases of our time is prevention, through food and other lifestyle changes. Medicine (such as prescription pills and surgeries) is often only a Band-Aid on the problem, not a long-term solution. Most common diseases of our time include obesity, type 2 diabetes, hypertension, heart disease, and cancer. The Pritikin Program is recommended and has been proven to help reduce, reverse, and/or prevent the damaging effects of metabolic syndrome.  Nutrition   Overview of the Pritikin Eating Plan  Clinical staff conducted group or individual video education with verbal and written material and guidebook.  Patient learns about the [place] for disease risk reduction. The Pritikin  Eating Plan emphasizes a wide variety of unrefined, minimally-processed carbohydrates, like fruits, vegetables, whole grains, and legumes. Go, Caution, and Stop food choices are explained. Plant-based and lean animal proteins are emphasized. Rationale provided for low sodium intake for blood pressure control, low added sugars for blood sugar stabilization, and low added fats and oils for coronary artery disease risk reduction and weight management.  Calorie Density  Clinical staff conducted group or individual video education with verbal and written material and guidebook.  Patient learns about calorie density and how it impacts the Pritikin Eating Plan. Knowing the characteristics of the food you choose will help you decide whether those foods will lead to weight gain or weight loss, and whether you want to consume more or less of them. Weight loss is usually a side effect of the Pritikin Eating Plan because of its focus on low calorie-dense foods.  Label Reading  Clinical staff conducted group or individual video education with verbal and written  material and guidebook.  Patient learns about the Pritikin recommended label reading guidelines and corresponding recommendations regarding calorie density, added sugars, sodium content, and whole grains.  Dining Out - Part 1  Clinical staff conducted group or individual video education with verbal and written material and guidebook.  Patient learns that restaurant meals can be sabotaging because they can be so high in calories, fat, sodium, and/or sugar. Patient learns recommended strategies on how to positively address this and avoid unhealthy pitfalls.  Facts on Fats  Clinical staff conducted group or individual video education with verbal and written material and guidebook.  Patient learns that lifestyle modifications can be just as effective, if not more so, as many medications for lowering your risk of heart disease. A Pritikin lifestyle can help to reduce your risk of inflammation and atherosclerosis (cholesterol build-up, or plaque, in the artery walls). Lifestyle interventions such as dietary choices and physical activity address the cause of atherosclerosis. A review of the types of fats and their impact on blood cholesterol levels, along with dietary recommendations to reduce fat intake is also included.  Nutrition Action Plan  Clinical staff conducted group or individual video education with verbal and written material and guidebook.  Patient learns how to incorporate Pritikin recommendations into their lifestyle. Recommendations include planning and keeping personal health goals in mind as an important part of their success.  Healthy Mind-Set    Healthy Minds, Bodies, Hearts  Clinical staff conducted group or individual video education with verbal and written material and guidebook.  Patient learns how to identify when they are stressed. Video will discuss the impact of that stress, as well as the many benefits of stress management. Patient will also be introduced to stress management  techniques. The way we think, act, and feel has an impact on our hearts.  How Our Thoughts Can Heal Our Hearts  Clinical staff conducted group or individual video education with verbal and written material and guidebook.  Patient learns that negative thoughts can cause depression and anxiety. This can result in negative lifestyle behavior and serious health problems. Cognitive behavioral therapy is an effective method to help control our thoughts in order to change and improve our emotional outlook.  Additional Videos:  Exercise    Improving Performance  Clinical staff conducted group or individual video education with verbal and written material and guidebook.  Patient learns to use a non-linear approach by alternating intensity levels and lengths of time spent exercising to help burn more calories and lose more body  fat. Cardiovascular exercise helps improve heart health, metabolism, hormonal balance, blood sugar control, and recovery from fatigue. Resistance training improves strength, endurance, balance, coordination, reaction time, metabolism, and muscle mass. Flexibility exercise improves circulation, posture, and balance. Seek guidance from your physician and exercise physiologist before implementing an exercise routine and learn your capabilities and proper form for all exercise.  Introduction to Yoga  Clinical staff conducted group or individual video education with verbal and written material and guidebook.  Patient learns about yoga, a discipline of the coming together of mind, breath, and body. The benefits of yoga include improved flexibility, improved range of motion, better posture and core strength, increased lung function, weight loss, and positive self-image. Yoga's heart health benefits include lowered blood pressure, healthier heart rate, decreased cholesterol and triglyceride levels, improved immune function, and reduced stress. Seek guidance from your physician and exercise  physiologist before implementing an exercise routine and learn your capabilities and proper form for all exercise.  Medical   Aging: Enhancing Your Quality of Life  Clinical staff conducted group or individual video education with verbal and written material and guidebook.  Patient learns key strategies and recommendations to stay in good physical health and enhance quality of life, such as prevention strategies, having an advocate, securing a [place], and keeping a list of medications and system for tracking them. It also discusses how to avoid risk for bone loss.  Biology of Weight Control  Clinical staff conducted group or individual video education with verbal and written material and guidebook.  Patient learns that weight gain occurs because we consume more calories than we burn (eating more, moving less). Even if your body weight is normal, you may have higher ratios of fat compared to muscle mass. Too much body fat puts you at increased risk for cardiovascular disease, heart attack, stroke, type 2 diabetes, and obesity-related cancers. In addition to exercise, following the [place] can help reduce your risk.  Decoding Lab Results  Clinical staff conducted group or individual video education with verbal and written material and guidebook.  Patient learns that lab test reflects one measurement whose values change over time and are influenced by many factors, including medication, stress, sleep, exercise, food, hydration, pre-existing medical conditions, and more. It is recommended to use the knowledge from this video to become more involved with your lab results and evaluate your numbers to speak with your doctor.   Diseases of Our Time - Overview  Clinical staff conducted group or individual video education with verbal and written material and guidebook.  Patient learns that according to the CDC, 50% to 70% of chronic diseases (such as obesity,  type 2 diabetes, elevated lipids, hypertension, and heart disease) are avoidable through lifestyle improvements including healthier food choices, listening to satiety cues, and increased physical activity.  Sleep Disorders Clinical staff conducted group or individual video education with verbal and written material and guidebook.  Patient learns how good quality and duration of sleep are important to overall health and well-being. Patient also learns about sleep disorders and how they impact health along with recommendations to address them, including discussing with a physician.  Nutrition  Dining Out - Part 2 Clinical staff conducted group or individual video education with verbal and written material and guidebook.  Patient learns how to plan ahead and communicate in order to maximize their dining experience in a healthy and nutritious manner. Included are recommended food choices based on the type of restaurant  the patient is visiting.   Fueling a Best boy conducted group or individual video education with verbal and written material and guidebook.  There is a strong connection between our food choices and our health. Diseases like obesity and type 2 diabetes are very prevalent and are in large-part due to lifestyle choices. The Pritikin Eating Plan provides plenty of food and hunger-curbing satisfaction. It is easy to follow, affordable, and helps reduce health risks.  Menu Workshop  Clinical staff conducted group or individual video education with verbal and written material and guidebook.  Patient learns that restaurant meals can sabotage health goals because they are often packed with calories, fat, sodium, and sugar. Recommendations include strategies to plan ahead and to communicate with the manager, chef, or server to help order a healthier meal.  Planning Your Eating Strategy  Clinical staff conducted group or individual video education with verbal and written  material and guidebook.  Patient learns about the [place] and its benefit of reducing the risk of disease. The [place] does not focus on calories. Instead, it emphasizes high-quality, nutrient-rich foods. By knowing the characteristics of the foods, we choose, we can determine their calorie density and make informed decisions.  Targeting Your Nutrition Priorities  Clinical staff conducted group or individual video education with verbal and written material and guidebook.  Patient learns that lifestyle habits have a tremendous impact on disease risk and progression. This video provides eating and physical activity recommendations based on your personal health goals, such as reducing LDL cholesterol, losing weight, preventing or controlling type 2 diabetes, and reducing high blood pressure.  Vitamins and Minerals  Clinical staff conducted group or individual video education with verbal and written material and guidebook.  Patient learns different ways to obtain key vitamins and minerals, including through a recommended healthy diet. It is important to discuss all supplements you take with your doctor.   Healthy Mind-Set    Smoking Cessation  Clinical staff conducted group or individual video education with verbal and written material and guidebook.  Patient learns that cigarette smoking and tobacco addiction pose a serious health risk which affects millions of people. Stopping smoking will significantly reduce the risk of heart disease, lung disease, and many forms of cancer. Recommended strategies for quitting are covered, including working with your doctor to develop a successful plan.  Culinary   Becoming a Financial trader conducted group or individual video education with verbal and written material and guidebook.  Patient learns that cooking at home can be healthy, cost-effective, quick, and puts them in control. Keys to cooking healthy recipes will  include looking at your recipe, assessing your equipment needs, planning ahead, making it simple, choosing cost-effective seasonal ingredients, and limiting the use of added fats, salts, and sugars.  Cooking - Breakfast and Snacks  Clinical staff conducted group or individual video education with verbal and written material and guidebook.  Patient learns how important breakfast is to satiety and nutrition through the entire day. Recommendations include key foods to eat during breakfast to help stabilize blood sugar levels and to prevent overeating at meals later in the day. Planning ahead is also a key component.  Cooking - Human resources officer conducted group or individual video education with verbal and written material and guidebook.  Patient learns eating strategies to improve overall health, including an approach to cook more at home. Recommendations include thinking of animal protein as a side on  your plate rather than center stage and focusing instead on lower calorie dense options like vegetables, fruits, whole grains, and plant-based proteins, such as beans. Making sauces in large quantities to freeze for later and leaving the skin on your vegetables are also recommended to maximize your experience.  Cooking - Healthy Salads and Dressing Clinical staff conducted group or individual video education with verbal and written material and guidebook.  Patient learns that vegetables, fruits, whole grains, and legumes are the foundations of the [place]. Recommendations include how to incorporate each of these in flavorful and healthy salads, and how to create homemade salad dressings. Proper handling of ingredients is also covered. Cooking - Soups and [employer] - Soups and Desserts Clinical staff conducted group or individual video education with verbal and written material and guidebook.  Patient learns that Pritikin soups and desserts make for easy, nutritious, and  delicious snacks and meal components that are low in sodium, fat, sugar, and calorie density, while high in vitamins, minerals, and filling fiber. Recommendations include simple and healthy ideas for soups and desserts.   Overview     The Pritikin Solution Program Overview Clinical staff conducted group or individual video education with verbal and written material and guidebook.  Patient learns that the results of the [place] Program have been documented in more than 100 articles published in peer-reviewed journals, and the benefits include reducing risk factors for (and, in some cases, even reversing) high cholesterol, high blood pressure, type 2 diabetes, obesity, and more! An overview of the three key pillars of the Pritikin Program will be covered: eating well, doing regular exercise, and having a healthy mind-set.  WORKSHOPS  Exercise: Exercise Basics: Building Your Action Plan Clinical staff led group instruction and group discussion with PowerPoint presentation and patient guidebook. To enhance the learning environment the use of posters, models and videos may be added. At the conclusion of this workshop, patients will comprehend the difference between physical activity and exercise, as well as the benefits of incorporating both, into their routine. Patients will understand the FITT (Frequency, Intensity, Time, and Type) principle and how to use it to build an exercise action plan. In addition, safety concerns and other considerations for exercise and cardiac rehab will be addressed by the presenter. The purpose of this lesson is to promote a comprehensive and effective weekly exercise routine in order to improve patients' overall level of fitness.   Managing Heart Disease: Your Path to a Healthier Heart Clinical staff led group instruction and group discussion with PowerPoint presentation and patient guidebook. To enhance the learning environment the use of posters, models and videos  may be added.At the conclusion of this workshop, patients will understand the anatomy and physiology of the heart. Additionally, they will understand how Pritikin's three pillars impact the risk factors, the progression, and the management of heart disease.  The purpose of this lesson is to provide a high-level overview of the heart, heart disease, and how the Pritikin lifestyle positively impacts risk factors.  Exercise Biomechanics Clinical staff led group instruction and group discussion with PowerPoint presentation and patient guidebook. To enhance the learning environment the use of posters, models and videos may be added. Patients will learn how the structural parts of their bodies function and how these functions impact their daily activities, movement, and exercise. Patients will learn how to promote a neutral spine, learn how to manage pain, and identify ways to improve their physical movement in order to promote healthy living.  The purpose of this lesson is to expose patients to common physical limitations that impact physical activity. Participants will learn practical ways to adapt and manage aches and pains, and to minimize their effect on regular exercise. Patients will learn how to maintain good posture while sitting, walking, and lifting.  Balance Training and Fall Prevention  Clinical staff led group instruction and group discussion with PowerPoint presentation and patient guidebook. To enhance the learning environment the use of posters, models and videos may be added. At the conclusion of this workshop, patients will understand the importance of their sensorimotor skills (vision, proprioception, and the vestibular system) in maintaining their ability to balance as they age. Patients will apply a variety of balancing exercises that are appropriate for their current level of function. Patients will understand the common causes for poor balance, possible solutions to these  problems, and ways to modify their physical environment in order to minimize their fall risk. The purpose of this lesson is to teach patients about the importance of maintaining balance as they age and ways to minimize their risk of falling.  WORKSHOPS   Nutrition:  Fueling a Scientist, research (physical sciences) led group instruction and group discussion with PowerPoint presentation and patient guidebook. To enhance the learning environment the use of posters, models and videos may be added. Patients will review the foundational principles of the [place] and understand what constitutes a serving size in each of the food groups. Patients will also learn Pritikin-friendly foods that are better choices when away from home and review make-ahead meal and snack options. Calorie density will be reviewed and applied to three nutrition priorities: weight maintenance, weight loss, and weight gain. The purpose of this lesson is to reinforce (in a group setting) the key concepts around what patients are recommended to eat and how to apply these guidelines when away from home by planning and selecting Pritikin-friendly options. Patients will understand how calorie density may be adjusted for different weight management goals.  Mindful Eating  Clinical staff led group instruction and group discussion with PowerPoint presentation and patient guidebook. To enhance the learning environment the use of posters, models and videos may be added. Patients will briefly review the concepts of the [place] and the importance of low-calorie dense foods. The concept of mindful eating will be introduced as well as the importance of paying attention to internal hunger signals. Triggers for non-hunger eating and techniques for dealing with triggers will be explored. The purpose of this lesson is to provide patients with the opportunity to review the basic principles of the [place], discuss the value of  eating mindfully and how to measure internal cues of hunger and fullness using the Hunger Scale. Patients will also discuss reasons for non-hunger eating and learn strategies to use for controlling emotional eating.  Targeting Your Nutrition Priorities Clinical staff led group instruction and group discussion with PowerPoint presentation and patient guidebook. To enhance the learning environment the use of posters, models and videos may be added. Patients will learn how to determine their genetic susceptibility to disease by reviewing their family history. Patients will gain insight into the importance of diet as part of an overall healthy lifestyle in mitigating the impact of genetics and other environmental insults. The purpose of this lesson is to provide patients with the opportunity to assess their personal nutrition priorities by looking at their family history, their own health history and current risk factors. Patients will also be able to  discuss ways of prioritizing and modifying the [place] for their highest risk areas  Menu  Clinical staff led group instruction and group discussion with PowerPoint presentation and patient guidebook. To enhance the learning environment the use of posters, models and videos may be added. Using menus brought in from [employer], or printed from [employer], patients will apply the [place] dining out guidelines that were presented in the [employer] video. Patients will also be able to practice these guidelines in a variety of provided scenarios. The purpose of this lesson is to provide patients with the opportunity to practice hands-on learning of the [place] with actual menus and practice scenarios.  Label Reading Clinical staff led group instruction and group discussion with PowerPoint presentation and patient guidebook. To enhance the learning environment the use of posters, models and videos may be  added. Patients will review and discuss the Pritikin label reading guidelines presented in Pritikin's Label Reading Educational series video. Using fool labels brought in from local grocery stores and markets, patients will apply the label reading guidelines and determine if the packaged food meet the Pritikin guidelines. The purpose of this lesson is to provide patients with the opportunity to review, discuss, and practice hands-on learning of the Pritikin Label Reading guidelines with actual packaged food labels. [place] Workshops are designed to teach patients ways to prepare quick, simple, and affordable recipes at home. The importance of nutrition's role in chronic disease risk reduction is reflected in its emphasis in the overall Pritikin program. By learning how to prepare essential core Pritikin Eating Plan recipes, patients will increase control over what they eat; be able to customize the flavor of foods without the use of added salt, sugar, or fat; and improve the quality of the food they consume. By learning a set of core recipes which are easily assembled, quickly prepared, and affordable, patients are more likely to prepare more healthy foods at home. These workshops focus on convenient breakfasts, simple entres, side dishes, and desserts which can be prepared with minimal effort and are consistent with nutrition recommendations for cardiovascular risk reduction. Cooking [employer] are taught by a Engineer, materials (RD) who has been trained by the [employer]. The chef or RD has a clear understanding of the importance of minimizing - if not completely eliminating - added fat, sugar, and sodium in recipes. Throughout the series of [place] Workshop sessions, patients will learn about healthy ingredients and efficient methods of cooking to build confidence in their capability to prepare    Cooking School weekly topics:  Adding  Flavor- Sodium-Free  Fast and Healthy Breakfasts  Powerhouse Plant-Based Proteins  Satisfying Salads and Dressings  Simple Sides and Sauces  International Cuisine-Spotlight on the [place] Zones  Delicious Desserts  Savory Soups  Efficiency Cooking - Meals in a Snap  Tasty Appetizers and Snacks  Comforting Weekend Breakfasts  One-Pot Wonders   Fast Evening Meals  Easy [place] (Psychosocial): New Thoughts, New Behaviors Clinical staff led group instruction and group discussion with PowerPoint presentation and patient guidebook. To enhance the learning environment the use of posters, models and videos may be added. Patients will learn and practice techniques for developing effective health and lifestyle goals. Patients will be able to effectively apply the goal setting process learned to develop at least one new personal goal.  The purpose of  this lesson is to expose patients to a new skill set of behavior modification techniques such as techniques setting SMART goals, overcoming barriers, and achieving new thoughts and new behaviors.  Managing Moods and Relationships Clinical staff led group instruction and group discussion with PowerPoint presentation and patient guidebook. To enhance the learning environment the use of posters, models and videos may be added. Patients will learn how emotional and chronic stress factors can impact their health and relationships. They will learn healthy ways to manage their moods and utilize positive coping mechanisms. In addition, ICR patients will learn ways to improve communication skills. The purpose of this lesson is to expose patients to ways of understanding how one's mood and health are intimately connected. Developing a healthy outlook can help build positive relationships and connections with others. Patients will understand the importance of utilizing effective communication skills  that include actively listening and being heard. They will learn and understand the importance of the "4 Cs" and especially Connections in fostering of a Healthy Mind-Set.  Healthy Sleep for a Healthy Heart Clinical staff led group instruction and group discussion with PowerPoint presentation and patient guidebook. To enhance the learning environment the use of posters, models and videos may be added. At the conclusion of this workshop, patients will be able to demonstrate knowledge of the importance of sleep to overall health, well-being, and quality of life. They will understand the symptoms of, and treatments for, common sleep disorders. Patients will also be able to identify daytime and nighttime behaviors which impact sleep, and they will be able to apply these tools to help manage sleep-related challenges. The purpose of this lesson is to provide patients with a general overview of sleep and outline the importance of quality sleep. Patients will learn about a few of the most common sleep disorders. Patients will also be introduced to the concept of "sleep hygiene," and discover ways to self-manage certain sleeping problems through simple daily behavior changes. Finally, the workshop will motivate patients by clarifying the links between quality sleep and their goals of heart-healthy living.   Recognizing and Reducing Stress Clinical staff led group instruction and group discussion with PowerPoint presentation and patient guidebook. To enhance the learning environment the use of posters, models and videos may be added. At the conclusion of this workshop, patients will be able to understand the types of stress reactions, differentiate between acute and chronic stress, and recognize the impact that chronic stress has on their health. They will also be able to apply different coping mechanisms, such as reframing negative self-talk. Patients will have the opportunity to practice a variety of stress management  techniques, such as deep abdominal breathing, progressive muscle relaxation, and/or guided imagery.  The purpose of this lesson is to educate patients on the role of stress in their lives and to provide healthy techniques for coping with it.  Learning Barriers/Preferences:  Learning Barriers/Preferences - 10/15/21 1412       Learning Barriers/Preferences   Learning Barriers Sight    Learning Preferences Skilled Demonstration             Education Topics:  Knowledge Questionnaire Score:  Knowledge Questionnaire Score - 10/15/21 1409       Knowledge Questionnaire Score   Pre Score 22/24             Core Components/Risk Factors/Patient Goals at Admission:  Personal Goals and Risk Factors at Admission - 10/15/21 1410       Core Components/Risk Factors/Patient Goals on Admission  Weight Management Yes;Obesity;Weight Loss    Intervention Weight Management: Develop a combined nutrition and exercise program designed to reach desired caloric intake, while maintaining appropriate intake of nutrient and fiber, sodium and fats, and appropriate energy expenditure required for the weight goal.;Weight Management: Provide education and appropriate resources to help participant work on and attain dietary goals.;Weight Management/Obesity: Establish reasonable short term and long term weight goals.;Obesity: Provide education and appropriate resources to help participant work on and attain dietary goals.    Admit Weight 230 lb 13.2 oz (104.7 kg)    Expected Outcomes Long Term: Adherence to nutrition and physical activity/exercise program aimed toward attainment of established weight goal;Short Term: Continue to assess and modify interventions until short term weight is achieved;Weight Maintenance: Understanding of the daily nutrition guidelines, which includes 25-35% calories from fat, 7% or less cal from saturated fats, less than '200mg'$  cholesterol, less than 1.5gm of sodium, & 5 or more  servings of fruits and vegetables daily;Weight Loss: Understanding of general recommendations for a balanced deficit meal plan, which promotes 1-2 lb weight loss per week and includes a negative energy balance of [phone] kcal/d;Understanding recommendations for meals to include 15-35% energy as protein, 25-35% energy from fat, 35-60% energy from carbohydrates, less than '200mg'$  of dietary cholesterol, 20-35 gm of total fiber daily;Understanding of distribution of calorie intake throughout the day with the consumption of 4-5 meals/snacks    Improve shortness of breath with ADL's Yes    Intervention Provide education, individualized exercise plan and daily activity instruction to help decrease symptoms of SOB with activities of daily living.    Expected Outcomes Short Term: Improve cardiorespiratory fitness to achieve a reduction of symptoms when performing ADLs;Long Term: Be able to perform more ADLs without symptoms or delay the onset of symptoms    Diabetes Yes    Intervention Provide education about signs/symptoms and action to take for hypo/hyperglycemia.;Provide education about proper nutrition, including hydration, and aerobic/resistive exercise prescription along with prescribed medications to achieve blood glucose in normal ranges: Fasting glucose 65-99 mg/dL    Expected Outcomes Short Term: Participant verbalizes understanding of the signs/symptoms and immediate care of hyper/hypoglycemia, proper foot care and importance of medication, aerobic/resistive exercise and nutrition plan for blood glucose control.;Long Term: Attainment of HbA1C < 7%.    Heart Failure Yes    Intervention Provide a combined exercise and nutrition program that is supplemented with education, support and counseling about heart failure. Directed toward relieving symptoms such as shortness of breath, decreased exercise tolerance, and extremity edema.    Expected Outcomes Improve functional capacity of life;Short term: Attendance  in program 2-3 days a week with increased exercise capacity. Reported lower sodium intake. Reported increased fruit and vegetable intake. Reports medication compliance.;Short term: Daily weights obtained and reported for increase. Utilizing diuretic protocols set by physician.;Long term: Adoption of self-care skills and reduction of barriers for early signs and symptoms recognition and intervention leading to self-care maintenance.    Hypertension Yes    Intervention Provide education on lifestyle modifcations including regular physical activity/exercise, weight management, moderate sodium restriction and increased consumption of fresh fruit, vegetables, and low fat dairy, alcohol moderation, and smoking cessation.;Monitor prescription use compliance.    Expected Outcomes Short Term: Continued assessment and intervention until BP is < 140/62m HG in hypertensive participants. < 130/825mHG in hypertensive participants with diabetes, heart failure or chronic kidney disease.;Long Term: Maintenance of blood pressure at goal levels.    Lipids Yes    Intervention Provide education and  support for participant on nutrition & aerobic/resistive exercise along with prescribed medications to achieve LDL '70mg'$ , HDL >'40mg'$ .    Expected Outcomes Short Term: Participant states understanding of desired cholesterol values and is compliant with medications prescribed. Participant is following exercise prescription and nutrition guidelines.;Long Term: Cholesterol controlled with medications as prescribed, with individualized exercise RX and with personalized nutrition plan. Value goals: LDL < '70mg'$ , HDL > 40 mg.    Stress Yes    Intervention Offer individual and/or small group education and counseling on adjustment to heart disease, stress management and health-related lifestyle change. Teach and support self-help strategies.;Refer participants experiencing significant psychosocial distress to appropriate mental health  specialists for further evaluation and treatment. When possible, include family members and significant others in education/counseling sessions.    Expected Outcomes Short Term: Participant demonstrates changes in health-related behavior, relaxation and other stress management skills, ability to obtain effective social support, and compliance with psychotropic medications if prescribed.;Long Term: Emotional wellbeing is indicated by absence of clinically significant psychosocial distress or social isolation.             Core Components/Risk Factors/Patient Goals Review:   Goals and Risk Factor Review     Row Name 10/20/21 9417 11/10/21 1343 12/08/21 1813         Core Components/Risk Factors/Patient Goals Review   Personal Goals Review Weight Management/Obesity;Lipids;Stress;Diabetes;Improve shortness of breath with ADL's;Hypertension Weight Management/Obesity;Lipids;Stress;Diabetes;Improve shortness of breath with ADL's;Hypertension Weight Management/Obesity;Lipids;Stress;Diabetes;Improve shortness of breath with ADL's;Hypertension     Review [name] [name] started intensive cardiac rehab on 10/19/21. [name] [name] did well with exercise. Vital signs and CBG's were stable. New medication addtion noted. [name] [name] exercise with 2l/min of oxygen did not report chest pain. [name] [name] has been doing well with exercise for her fitness level. Vital signs, CBG's and oxygen saturationa have been stable as [name] [name] has been exercising on 2l/min of oxygen. Blood pressure has been better controlled. [name] [name] has not reported any chest pain. Weight unchanged. [name] [name] has been doing well with exercise for her fitness level. Vital signs, CBG's and oxygen saturationa have been stable as [name] [name] has been exercising on 2l/min of oxygen. Blood pressure has been better controlled. [name] [name] has not reported any chest pain. [name] [name] lost 2.2 kg since starting intensive cardiac rehab and will complete the program on 12/23/21     Expected Outcomes  Jadaya will continue to participate in intensve cardiac rehab for exercise, nutrition and lifestyle modifications [name] [name] will continue to participate in intensve cardiac rehab for exercise, nutrition and lifestyle modifications [name] [name] will continue to participate in intensve cardiac rehab for exercise, nutrition and lifestyle modifications              Core Components/Risk Factors/Patient Goals at Discharge (Final Review):   Goals and Risk Factor Review - 12/08/21 1813       Core Components/Risk Factors/Patient Goals Review   Personal Goals Review Weight Management/Obesity;Lipids;Stress;Diabetes;Improve shortness of breath with ADL's;Hypertension    Review [name] [name] has been doing well with exercise for her fitness level. Vital signs, CBG's and oxygen saturationa have been stable as [name] [name] has been exercising on 2l/min of oxygen. Blood pressure has been better controlled. [name] [name] has not reported any chest pain. [name] [name] lost 2.2 kg since starting intensive cardiac rehab and will complete the program on 12/23/21    Expected Outcomes [name] [name] will continue to participate in intensve cardiac rehab for exercise, nutrition and lifestyle modifications             ITP Comments:  ITP Comments  [place] Name 10/15/21 1519 10/20/21 0909 11/10/21 1340 12/08/21 1810     ITP Comments Dr Fransico Him Medical Director, Introduction to [facility] Education Program/ Intensive Cardiac Rehab. Initial Orientation packet reviewed with the patient. 30 Day ITP Review. Gabriell started intensive cardiac rehab on 10/19/21. Shantai did fair with exercise as she is deconditoned. Annaka exercsed on 2l/min of oxygen and took frequent rest breaks 30 Day ITP Review. Symone has good participation when in attendance at intensive cardiac rehab. Ramia has been stable with exercise for her fitness level. 30 Day ITP Review. [name] [name] has good participation and  attendance at intensive cardiac rehab. [name] [name] has been stable with exercise for  her fitness level. Corrinna will complete intensive cardiac rehab on 12/23/21             Comments: See ITP comments.Harrell Gave RN BSN

## 2021-12-09 ENCOUNTER — Telehealth (HOSPITAL_COMMUNITY): Payer: Self-pay | Admitting: Family Medicine

## 2021-12-09 ENCOUNTER — Encounter (HOSPITAL_COMMUNITY): Payer: Medicare Other

## 2021-12-11 ENCOUNTER — Encounter (HOSPITAL_COMMUNITY): Payer: Medicare Other

## 2021-12-11 ENCOUNTER — Telehealth (HOSPITAL_COMMUNITY): Payer: Self-pay | Admitting: Family Medicine

## 2021-12-14 ENCOUNTER — Encounter (HOSPITAL_COMMUNITY): Payer: Medicare Other

## 2021-12-15 ENCOUNTER — Ambulatory Visit (HOSPITAL_BASED_OUTPATIENT_CLINIC_OR_DEPARTMENT_OTHER): Payer: Medicare Other

## 2021-12-15 ENCOUNTER — Inpatient Hospital Stay (HOSPITAL_BASED_OUTPATIENT_CLINIC_OR_DEPARTMENT_OTHER): Admission: RE | Admit: 2021-12-15 | Payer: Medicare Other | Source: Ambulatory Visit

## 2021-12-15 ENCOUNTER — Other Ambulatory Visit (HOSPITAL_BASED_OUTPATIENT_CLINIC_OR_DEPARTMENT_OTHER): Payer: Medicare Other

## 2021-12-16 ENCOUNTER — Encounter (HOSPITAL_COMMUNITY)
Admission: RE | Admit: 2021-12-16 | Discharge: 2021-12-16 | Disposition: A | Discharge status: Home / Self Care | Payer: Medicare Other | Source: Ambulatory Visit | Attending: Cardiology | Admitting: Cardiology

## 2021-12-16 ENCOUNTER — Ambulatory Visit (HOSPITAL_BASED_OUTPATIENT_CLINIC_OR_DEPARTMENT_OTHER): Payer: Medicare Other

## 2021-12-16 VITALS — Wt 223.5 lb

## 2021-12-16 DIAGNOSIS — Z9861 Coronary angioplasty status: Secondary | ICD-10-CM

## 2021-12-16 NOTE — Progress Notes (Incomplete)
[name] [name] 76 y.o. female  [name] [name] is motivated to make lifestyle changes to aid with cardiac rehab. Patient has medical history of HTN, heart failure, cerebrovascular disease, TIA, CAD,afib, OSA, gastroparesis, DM2, s/p bariatric surgery.  She reports ongoing GI issues including, constipation, diarrhea. She live at home with her husband. He does most of the grocery shopping and cooking. [name] [name] continues to have some difficulty standing for ADL due to back pain. She remains motivated to lose weight and improve cardiovascular risk.   RMR:   Labs:   24 hour diet recall:  Breakfast:*** Lunch: *** Dinner: *** Snacks: *** Beverages: ***  Nutrition Diagnosis Morbid Obesity related to excessive energy intake as evidenced by a BMI 41.22 Inadequate fiber intake related to lack of previous education about dietary fiber as evidenced by estimated daily fiber intake of 15 g.  Nutrition Intervention Pt's individual nutrition plan reviewed with pt. Benefits of adopting Heart Healthy diet discussed.   Continue client-centered nutrition education by RD, as part of interdisciplinary care.  Monitor/Evaluation: Patient reports motivation to make lifestyle changes for adherence to heart healthy diet recommendation, blood sugar control, and weight management. We discussed ***. Handouts/notes given. Patient amicable to RD suggestions and verbalizes understanding. Will follow-up as needed.   *** minutes spent in review of topics related to a heart healthy diet including sodium intake, blood sugar control, weight management, label reading, hydration, and fiber intake. Start Time: 2:32pm End Time: 3:49pm  Goal(s) Patient to limit saturated fat and trans fat and prioritize fish, poultry, nonfat dairy, and vegetarian protein sources 2.   Patient to increase fiber intake through whole grains, fruits, and vegetables. Patient to        limit refined carbohydrates, simple sugars. 3.   Patient to prioritize  water and other zero calorie alternatives; limit/eliminate any sugar beverages.  4. Patient to identify high sodium food choices and reduce sodium intake to <2300 mg, aiming for 1500 mg daily. 5. Patient to practice mindful and intuitive eating exercise including eating on a schedule, understanding hunger/fullness, etc. 6. Patient to identify strategies and food quantities to achieve weight/weight gain of 0.5-2.0# per week   Plan:  Will provide client-centered nutrition education as part of interdisciplinary care Monitor and evaluate progress toward nutrition goal with team. Patient given the option to attend [facility] education, cooking workshops, and educational videos   [name] Springsteen [place], MS, RDN, LDN

## 2021-12-17 ENCOUNTER — Other Ambulatory Visit (HOSPITAL_COMMUNITY): Payer: Self-pay | Admitting: Cardiology

## 2021-12-21 ENCOUNTER — Encounter (HOSPITAL_COMMUNITY)
Admission: RE | Admit: 2021-12-21 | Discharge: 2021-12-21 | Disposition: A | Discharge status: Home / Self Care | Payer: Medicare Other | Source: Ambulatory Visit | Attending: Cardiology | Admitting: Cardiology

## 2021-12-21 DIAGNOSIS — Z9861 Coronary angioplasty status: Secondary | ICD-10-CM | POA: Diagnosis not present

## 2021-12-23 ENCOUNTER — Encounter (HOSPITAL_COMMUNITY)
Admission: RE | Admit: 2021-12-23 | Discharge: 2021-12-23 | Disposition: A | Discharge status: Home / Self Care | Payer: Medicare Other | Source: Ambulatory Visit | Attending: Cardiology | Admitting: Cardiology

## 2021-12-23 DIAGNOSIS — Z9861 Coronary angioplasty status: Secondary | ICD-10-CM | POA: Diagnosis not present

## 2021-12-28 ENCOUNTER — Other Ambulatory Visit (HOSPITAL_COMMUNITY): Payer: Self-pay | Admitting: Cardiology

## 2021-12-28 ENCOUNTER — Encounter (HOSPITAL_COMMUNITY)
Admission: RE | Admit: 2021-12-28 | Discharge: 2021-12-28 | Disposition: A | Discharge status: Home / Self Care | Payer: Medicare Other | Source: Ambulatory Visit | Attending: Cardiology | Admitting: Cardiology

## 2021-12-28 DIAGNOSIS — Z9861 Coronary angioplasty status: Secondary | ICD-10-CM

## 2021-12-28 DIAGNOSIS — I6523 Occlusion and stenosis of bilateral carotid arteries: Secondary | ICD-10-CM

## 2021-12-30 ENCOUNTER — Encounter (HOSPITAL_COMMUNITY)
Admission: RE | Admit: 2021-12-30 | Discharge: 2021-12-30 | Disposition: A | Discharge status: Home / Self Care | Payer: Medicare Other | Source: Ambulatory Visit | Attending: Cardiology | Admitting: Cardiology

## 2021-12-30 DIAGNOSIS — Z9861 Coronary angioplasty status: Secondary | ICD-10-CM

## 2021-12-30 NOTE — Progress Notes (Signed)
Discharge Progress Report  Patient Details  Name: [name] [name] [name]: [id number] Date of Birth: [birth year] Referring Provider:   Flowsheet Row INTENSIVE CARDIAC REHAB ORIENT from 10/15/2021 in [facility]  Referring Provider Loralie Champagne, MD        Number of Visits: 18 exercise sessions and 19 educations classes  Reason for Discharge:  Patient reached a stable level of exercise. Patient independent in their exercise. Patient has met program and personal goals.  Smoking History:  Social History   Tobacco Use  Smoking Status Never  Smokeless Tobacco Never    Diagnosis:  04/20/21 S/P PTCA p RCA for ISR  ADL UCSD:   Initial Exercise Prescription:  Initial Exercise Prescription - 10/15/21 1400       Date of Initial Exercise RX and Referring Provider   Date 10/15/21    Referring Provider Loralie Champagne, MD    Expected Discharge Date 12/11/21      NuStep   Level 1    SPM 70    Minutes 20    METs 1.3      Prescription Details   Frequency (times per week) 2    Duration Progress to 30 minutes of continuous aerobic without signs/symptoms of physical distress      Intensity   THRR 40-80% of Max Heartrate 59-118    Ratings of Perceived Exertion 11-13    Perceived Dyspnea 0-4      Progression   Progression Continue progressive overload as per policy without signs/symptoms or physical distress.      Resistance Training   Training Prescription Yes    Weight 1 lb    Reps 10-15             Discharge Exercise Prescription (Final Exercise Prescription Changes):  Exercise Prescription Changes - 12/07/21 1500       Response to Exercise   Blood Pressure (Admit) 144/72    Blood Pressure (Exercise) 130/80    Blood Pressure (Exit) 124/70    Heart Rate (Admit) 63 bpm    Heart Rate (Exercise) 81 bpm    Heart Rate (Exit) 51 bpm    Oxygen Saturation (Admit) 96 %    Oxygen Saturation (Exercise) 96 %     Oxygen Saturation (Exit) 99 %    Rating of Perceived Exertion (Exercise) 11    Perceived Dyspnea (Exercise) 1    Symptoms SOB RPD = 1    Comments Reviewed goals, mets, home exercise Rx    Duration Continue with 30 min of aerobic exercise without signs/symptoms of physical distress.    Intensity THRR unchanged      Progression   Progression Continue to progress workloads to maintain intensity without signs/symptoms of physical distress.    Average METs 2.3      Resistance Training   Training Prescription Yes    Weight 1 lb    Reps 10-15    Time 10 Minutes      Interval Training   Interval Training No      Oxygen   Oxygen Continuous    Liters 2      NuStep   Level 1    Minutes 30    METs 2.3      Home Exercise Plan   Plans to continue exercise at Home (comment)    Frequency Add 4 additional days to program exercise sessions.    Initial Home Exercises Provided 12/07/21      Oxygen  Maintain Oxygen Saturation 88% or higher             Functional Capacity:  6 Minute Walk     Row Name 10/15/21 1413 12/23/21 1258       6 Minute Walk   Phase Initial  Perfomed on a Nustep Discharge    Distance 1115 feet 2230 feet    Distance % Change -- 100 %    Distance Feet Change -- 1115 ft    Walk Time 6 minutes 6 minutes    # of Rest Breaks 1  From 3:00 to 3:49 0    MPH 2.11 4.2    METS 1.3 3.4    RPE 11 13    Perceived Dyspnea  2 2    VO2 Peak 4.61 11.4    Symptoms Yes (comment) Yes (comment)    Comments left knee pain 2/10, low back ppain 4/10, angina(chest pressure) 2/10 Low back pain chronic 6/10    Resting HR 58 bpm 60 bpm    Resting BP 149/80 104/70    Resting Oxygen Saturation  96 % 98 %  2 L/min via n/c    Exercise Oxygen Saturation  during 6 min walk 95 % 99 %    Max Ex. HR 57 bpm 84 bpm    Max Ex. BP 170/74 124/70    2 Minute Post BP 154/80  Exit BP was 118/80 114/80      Interval Oxygen   Interval Oxygen? Yes --    Baseline Oxygen Saturation % 96 % --     1 Minute Oxygen Saturation % 96 % --    1 Minute Liters of Oxygen 0 L --    2 Minute Oxygen Saturation % 97 % --    2 Minute Liters of Oxygen 0 L --    3 Minute Oxygen Saturation % 96 % --    3 Minute Liters of Oxygen 0 L --    4 Minute Oxygen Saturation % 98 % --    4 Minute Liters of Oxygen 0 L --    5 Minute Oxygen Saturation % 97 % --    5 Minute Liters of Oxygen 0 L --    6 Minute Oxygen Saturation % 97 % --    6 Minute Liters of Oxygen 0 L --    2 Minute Post Oxygen Saturation % 96 % --    2 Minute Post Liters of Oxygen 0 L --             Psychological, QOL, Others - Outcomes: PHQ 2/9:    12/30/2021    3:01 PM 10/26/2021    1:54 PM 10/15/2021    3:38 PM 10/12/2018    2:25 PM 10/28/2016   11:12 AM  Depression screen PHQ 2/9  Decreased Interest _0 0  Down, Depressed, Hopeless 1 3 0 1   PHQ - 2 Score _1 0  Altered sleeping _2 Tired, decreased energy _3 Change in appetite 1 0  0   Feeling bad or failure about yourself  _4 Trouble concentrating _5 Moving slowly or fidgety/restless 0 1  0   Suicidal thoughts 0 0  0   PHQ-9 Score _6 Difficult doing work/chores Very difficult Very difficult  Somewhat difficult     Quality of Life:  Quality of Life -  12/16/21 1414       Quality of Life Scores   Health/Function Pre 7.2 %    Health/Function Post 11.3 %    Health/Function % Change 56.94 %    Socioeconomic Pre 24.79 %    Socioeconomic Post 27.86 %    Socioeconomic % Change  12.38 %    Psych/Spiritual Pre 9.86 %    Psych/Spiritual Post 17.36 %    Psych/Spiritual % Change 76.06 %    Family Pre 26.4 %    Family Post 25.2 %    Family % Change -4.55 %    GLOBAL Pre 14.19 %    GLOBAL Post 18 %    GLOBAL % Change 26.85 %             Personal Goals: Goals established at orientation with interventions provided to work toward goal.  Personal Goals and Risk Factors at Admission - 10/15/21 1410       Core  Components/Risk Factors/Patient Goals on Admission    Weight Management Yes;Obesity;Weight Loss    Intervention Weight Management: Develop a combined nutrition and exercise program designed to reach desired caloric intake, while maintaining appropriate intake of nutrient and fiber, sodium and fats, and appropriate energy expenditure required for the weight goal.;Weight Management: Provide education and appropriate resources to help participant work on and attain dietary goals.;Weight Management/Obesity: Establish reasonable short term and long term weight goals.;Obesity: Provide education and appropriate resources to help participant work on and attain dietary goals.    Admit Weight 230 lb 13.2 oz (104.7 kg)    Expected Outcomes Long Term: Adherence to nutrition and physical activity/exercise program aimed toward attainment of established weight goal;Short Term: Continue to assess and modify interventions until short term weight is achieved;Weight Maintenance: Understanding of the daily nutrition guidelines, which includes 25-35% calories from fat, 7% or less cal from saturated fats, less than 282m cholesterol, less than 1.5gm of sodium, & 5 or more servings of fruits and vegetables daily;Weight Loss: Understanding of general recommendations for a balanced deficit meal plan, which promotes 1-2 lb weight loss per week and includes a negative energy balance of [phone] kcal/d;Understanding recommendations for meals to include 15-35% energy as protein, 25-35% energy from fat, 35-60% energy from carbohydrates, less than 2029mof dietary cholesterol, 20-35 gm of total fiber daily;Understanding of distribution of calorie intake throughout the day with the consumption of 4-5 meals/snacks    Improve shortness of breath with ADL's Yes    Intervention Provide education, individualized exercise plan and daily activity instruction to help decrease symptoms of SOB with activities of daily living.    Expected Outcomes  Short Term: Improve cardiorespiratory fitness to achieve a reduction of symptoms when performing ADLs;Long Term: Be able to perform more ADLs without symptoms or delay the onset of symptoms    Diabetes Yes    Intervention Provide education about signs/symptoms and action to take for hypo/hyperglycemia.;Provide education about proper nutrition, including hydration, and aerobic/resistive exercise prescription along with prescribed medications to achieve blood glucose in normal ranges: Fasting glucose 65-99 mg/dL    Expected Outcomes Short Term: Participant verbalizes understanding of the signs/symptoms and immediate care of hyper/hypoglycemia, proper foot care and importance of medication, aerobic/resistive exercise and nutrition plan for blood glucose control.;Long Term: Attainment of HbA1C < 7%.    Heart Failure Yes    Intervention Provide a combined exercise and nutrition program that is supplemented with education, support and counseling about heart failure. Directed toward relieving symptoms such as shortness of  breath, decreased exercise tolerance, and extremity edema.    Expected Outcomes Improve functional capacity of life;Short term: Attendance in program 2-3 days a week with increased exercise capacity. Reported lower sodium intake. Reported increased fruit and vegetable intake. Reports medication compliance.;Short term: Daily weights obtained and reported for increase. Utilizing diuretic protocols set by physician.;Long term: Adoption of self-care skills and reduction of barriers for early signs and symptoms recognition and intervention leading to self-care maintenance.    Hypertension Yes    Intervention Provide education on lifestyle modifcations including regular physical activity/exercise, weight management, moderate sodium restriction and increased consumption of fresh fruit, vegetables, and low fat dairy, alcohol moderation, and smoking cessation.;Monitor prescription use compliance.     Expected Outcomes Short Term: Continued assessment and intervention until BP is < 140/36m HG in hypertensive participants. < 130/836mHG in hypertensive participants with diabetes, heart failure or chronic kidney disease.;Long Term: Maintenance of blood pressure at goal levels.    Lipids Yes    Intervention Provide education and support for participant on nutrition & aerobic/resistive exercise along with prescribed medications to achieve LDL <7053mHDL >26m15m  Expected Outcomes Short Term: Participant states understanding of desired cholesterol values and is compliant with medications prescribed. Participant is following exercise prescription and nutrition guidelines.;Long Term: Cholesterol controlled with medications as prescribed, with individualized exercise RX and with personalized nutrition plan. Value goals: LDL < 70mg71mL > 40 mg.    Stress Yes    Intervention Offer individual and/or small group education and counseling on adjustment to heart disease, stress management and health-related lifestyle change. Teach and support self-help strategies.;Refer participants experiencing significant psychosocial distress to appropriate mental health specialists for further evaluation and treatment. When possible, include family members and significant others in education/counseling sessions.    Expected Outcomes Short Term: Participant demonstrates changes in health-related behavior, relaxation and other stress management skills, ability to obtain effective social support, and compliance with psychotropic medications if prescribed.;Long Term: Emotional wellbeing is indicated by absence of clinically significant psychosocial distress or social isolation.              Personal Goals Discharge:  Goals and Risk Factor Review     Row Name 10/20/21 0923 12750/23 1343 12/08/21 1813         Core Components/Risk Factors/Patient Goals Review   Personal Goals Review Weight  Management/Obesity;Lipids;Stress;Diabetes;Improve shortness of breath with ADL's;Hypertension Weight Management/Obesity;Lipids;Stress;Diabetes;Improve shortness of breath with ADL's;Hypertension Weight Management/Obesity;Lipids;Stress;Diabetes;Improve shortness of breath with ADL's;Hypertension     Review AngelArdiceted intensive cardiac rehab on 10/19/21. AngelYariahwell with exercise. Vital signs and CBG's were stable. New medication addtion noted. Damyiah exercise with 2l/min of oxygen did not report chest pain. AngelPorterbeen doing well with exercise for her fitness level. Vital signs, CBG's and oxygen saturationa have been stable as AngelOrmabeen exercising on 2l/min of oxygen. Blood pressure has been better controlled. AngelKanianot reported any chest pain. Weight unchanged. AngelLegnabeen doing well with exercise for her fitness level. Vital signs, CBG's and oxygen saturationa have been stable as AngelLolahbeen exercising on 2l/min of oxygen. Blood pressure has been better controlled. AngelEllynnot reported any chest pain. AngelBrynnan 2.2 kg since starting intensive cardiac rehab and will complete the program on 12/23/21     Expected Outcomes AngelTeriyah continue to participate in intensve cardiac rehab for exercise, nutrition and lifestyle modifications AngelMekiyah continue to participate in intensve cardiac rehab for exercise, nutrition and lifestyle  modifications Jourdyn will continue to participate in intensve cardiac rehab for exercise, nutrition and lifestyle modifications              Exercise Goals and Review:  Exercise Goals     Row Name 10/15/21 1434             Exercise Goals   Increase Physical Activity Yes       Intervention Provide advice, education, support and counseling about physical activity/exercise needs.;Develop an individualized exercise prescription for aerobic and resistive training based on initial evaluation findings, risk stratification, comorbidities and  participant's personal goals.       Expected Outcomes Short Term: Attend rehab on a regular basis to increase amount of physical activity.;Long Term: Add in home exercise to make exercise part of routine and to increase amount of physical activity.;Long Term: Exercising regularly at least 3-5 days a week.       Increase Strength and Stamina Yes       Intervention Develop an individualized exercise prescription for aerobic and resistive training based on initial evaluation findings, risk stratification, comorbidities and participant's personal goals.;Provide advice, education, support and counseling about physical activity/exercise needs.       Expected Outcomes Short Term: Increase workloads from initial exercise prescription for resistance, speed, and METs.;Short Term: Perform resistance training exercises routinely during rehab and add in resistance training at home;Long Term: Improve cardiorespiratory fitness, muscular endurance and strength as measured by increased METs and functional capacity (6MWT)       Able to understand and use rate of perceived exertion (RPE) scale Yes       Intervention Provide education and explanation on how to use RPE scale       Expected Outcomes Short Term: Able to use RPE daily in rehab to express subjective intensity level;Long Term:  Able to use RPE to guide intensity level when exercising independently       Knowledge and understanding of Target Heart Rate Range (THRR) Yes       Intervention Provide education and explanation of THRR including how the numbers were predicted and where they are located for reference       Expected Outcomes Short Term: Able to state/look up THRR;Short Term: Able to use daily as guideline for intensity in rehab;Long Term: Able to use THRR to govern intensity when exercising independently       Understanding of Exercise Prescription Yes       Intervention Provide education, explanation, and written materials on patient's individual exercise  prescription       Expected Outcomes Short Term: Able to explain program exercise prescription;Long Term: Able to explain home exercise prescription to exercise independently                Exercise Goals Re-Evaluation:  Exercise Goals Re-Evaluation     Row Name 10/19/21 1434 10/19/21 1435 11/18/21 1400 12/07/21 1559       Exercise Goal Re-Evaluation   Exercise Goals Review Increase Physical Activity;Increase Strength and Stamina;Able to understand and use rate of perceived exertion (RPE) scale;Knowledge and understanding of Target Heart Rate Range (THRR);Understanding of Exercise Prescription (P)  Increase Physical Activity;Increase Strength and Stamina;Able to understand and use rate of perceived exertion (RPE) scale;Knowledge and understanding of Target Heart Rate Range (THRR);Understanding of Exercise Prescription Increase Physical Activity;Increase Strength and Stamina;Able to understand and use rate of perceived exertion (RPE) scale;Knowledge and understanding of Target Heart Rate Range (THRR);Understanding of Exercise Prescription Increase Physical Activity;Increase Strength and Stamina;Able to understand  and use rate of perceived exertion (RPE) scale;Knowledge and understanding of Target Heart Rate Range (THRR);Understanding of Exercise Prescription    Comments Pt (P)  Pt's first day in the [facility] program. Pt understands the exercise Rx, THRR, and RPE scale. Reviewed METs and goals. Pt had peak METs of 1.9 today. Pt is cominmg back after a fall at home and is making progress to recapture her gains. Pt voices that her stregth and stamina have not improved and has been set back a bit due to her fall. Reviewed METs, goals and home exercise RX. Pt voices that she has made some gains in strength and stamina but she feels the fall at home has set her back a bit. Peak METs 2.3.    Expected Outcomes -- Will continue to monitor patient and progress exercise workloads as tolerated. Will continue to  monitor patient and progress exercise workloads as tolerated. Will continue to monitor patient and progress exercise workloads as tolerated.             Nutrition & Weight - Outcomes:  Pre Biometrics - 10/15/21 1105       Pre Biometrics   Waist Circumference 46 inches    Hip Circumference 58.5 inches    Waist to Hip Ratio 0.79 %    Triceps Skinfold 45 mm    % Body Fat 54.5 %    Grip Strength 18 kg    Flexibility --   Not performed due to low back poroblems   Single Leg Stand --   Uses walker, h/o of falls; not performed             Nutrition:  Nutrition Therapy & Goals - 12/17/21 0931       Nutrition Therapy   Diet Heart Healthy Diet    Drug/Food Interactions Statins/Certain Fruits      Personal Nutrition Goals   Nutrition Goal Patient to identify strategies for managing cardiovascular risk by attending the weekly [facility] education and nutrition courses.    Personal Goal #2 Patient to identify strategies for weight loss of 0.5-2.0# per week of weight loss    Personal Goal #3 Patient to limit to <1549m of sodium daily    Personal Goal #4 Patient to identify food sources and limit daily intake of saturated fat, trans fat, sodium, and refined carbohydrates.    Comments Goals in progress. ATakyiacontinues to attend the [facility] education and nutrition series and completed one on one nutrition counseling. Reviewed recommendations for increasing fiber intake from fruits, vegetables, and whole grains. Reviewed strategies for weight loss including using the plate method as a guide for meal planning. Patient with good knowledge of reading food labels for sodium and sugar. Her husband continues to be supportive of lifestyle changes.      Intervention Plan   Intervention Prescribe, educate and counsel regarding individualized specific dietary modifications aiming towards targeted core components such as weight, hypertension, lipid management, diabetes, heart failure and other  comorbidities.;Nutrition handout(s) given to patient.    Expected Outcomes Short Term Goal: Understand basic principles of dietary content, such as calories, fat, sodium, cholesterol and nutrients.;Long Term Goal: Adherence to prescribed nutrition plan.             Nutrition Discharge:  Nutrition Assessments - 10/19/21 1423       Rate Your Plate Scores   Pre Score 78             Education Questionnaire Score:  Knowledge Questionnaire Score - 12/16/21 1415  Knowledge Questionnaire Score   Post Score 23/24             Goals reviewed with patient; copy given to patient.Pt graduates from  Intensive/Traditional cardiac rehab program today with completion of  18 exercise and 19 education sessions. Pt maintained good attendance and progressed nicely during their participation in rehab as evidenced by increased MET level.   Medication list reconciled. Repeat  PHQ score- 15 . Rushie remains depressed her antidepressant was recently increased by her primary care provider, Dr Sandi Mariscal.  Pt has made significant lifestyle changes and should be commended for their success. Pura achieved their goals during cardiac rehab and reports feeling stronger during her participation in the program.   Pt plans to continue exercise at the [employer]. [place] will also continue to do her home exercises. Bryar increased her distance on her post exercise nustep test by 1115 feet. We are proud of Samamtha's progress and weight loss. Antaniya lost 4.5 kg!Levada Dy did not have any complaints of Angina as she exercised on oxygen. Harrell Gave RN BSN

## 2022-01-12 ENCOUNTER — Encounter (HOSPITAL_BASED_OUTPATIENT_CLINIC_OR_DEPARTMENT_OTHER): Payer: Self-pay

## 2022-01-12 ENCOUNTER — Ambulatory Visit (HOSPITAL_BASED_OUTPATIENT_CLINIC_OR_DEPARTMENT_OTHER)
Admission: RE | Admit: 2022-01-12 | Discharge: 2022-01-12 | Disposition: A | Discharge status: Home / Self Care | Payer: Medicare Other | Source: Ambulatory Visit | Attending: Family Medicine | Admitting: Family Medicine

## 2022-01-12 DIAGNOSIS — Z1231 Encounter for screening mammogram for malignant neoplasm of breast: Secondary | ICD-10-CM | POA: Diagnosis present

## 2022-01-12 DIAGNOSIS — R1030 Lower abdominal pain, unspecified: Secondary | ICD-10-CM | POA: Diagnosis not present

## 2022-01-12 DIAGNOSIS — M810 Age-related osteoporosis without current pathological fracture: Secondary | ICD-10-CM | POA: Diagnosis not present

## 2022-01-12 LAB — POCT I-STAT CREATININE: Creatinine, Ser: 1 mg/dL (ref 0.44–1.00)

## 2022-01-12 MED ORDER — IOHEXOL 300 MG/ML  SOLN
100.0000 mL | Freq: Once | INTRAMUSCULAR | Status: AC | PRN
  Administered 2022-01-12: 100 mL via INTRAVENOUS

## 2022-01-13 ENCOUNTER — Ambulatory Visit (HOSPITAL_COMMUNITY)
Admission: RE | Admit: 2022-01-13 | Discharge: 2022-01-13 | Disposition: A | Discharge status: Home / Self Care | Payer: Medicare Other | Source: Ambulatory Visit | Attending: Cardiology | Admitting: Cardiology

## 2022-01-13 ENCOUNTER — Other Ambulatory Visit: Payer: Self-pay | Admitting: Pharmacist

## 2022-01-13 ENCOUNTER — Encounter (HOSPITAL_COMMUNITY): Payer: Self-pay | Admitting: Cardiology

## 2022-01-13 VITALS — BP 120/70 | HR 47 | Wt 219.0 lb

## 2022-01-13 DIAGNOSIS — N189 Chronic kidney disease, unspecified: Secondary | ICD-10-CM | POA: Insufficient documentation

## 2022-01-13 DIAGNOSIS — Z8673 Personal history of transient ischemic attack (TIA), and cerebral infarction without residual deficits: Secondary | ICD-10-CM | POA: Insufficient documentation

## 2022-01-13 DIAGNOSIS — E1122 Type 2 diabetes mellitus with diabetic chronic kidney disease: Secondary | ICD-10-CM | POA: Insufficient documentation

## 2022-01-13 DIAGNOSIS — R0789 Other chest pain: Secondary | ICD-10-CM | POA: Diagnosis not present

## 2022-01-13 DIAGNOSIS — Z79899 Other long term (current) drug therapy: Secondary | ICD-10-CM | POA: Insufficient documentation

## 2022-01-13 DIAGNOSIS — Z9981 Dependence on supplemental oxygen: Secondary | ICD-10-CM | POA: Insufficient documentation

## 2022-01-13 DIAGNOSIS — Z7902 Long term (current) use of antithrombotics/antiplatelets: Secondary | ICD-10-CM | POA: Diagnosis not present

## 2022-01-13 DIAGNOSIS — Z7901 Long term (current) use of anticoagulants: Secondary | ICD-10-CM | POA: Diagnosis not present

## 2022-01-13 DIAGNOSIS — Z955 Presence of coronary angioplasty implant and graft: Secondary | ICD-10-CM | POA: Insufficient documentation

## 2022-01-13 DIAGNOSIS — I272 Pulmonary hypertension, unspecified: Secondary | ICD-10-CM | POA: Diagnosis not present

## 2022-01-13 DIAGNOSIS — Z6841 Body Mass Index (BMI) 40.0 and over, adult: Secondary | ICD-10-CM | POA: Insufficient documentation

## 2022-01-13 DIAGNOSIS — I5022 Chronic systolic (congestive) heart failure: Secondary | ICD-10-CM | POA: Insufficient documentation

## 2022-01-13 DIAGNOSIS — Z794 Long term (current) use of insulin: Secondary | ICD-10-CM | POA: Diagnosis not present

## 2022-01-13 DIAGNOSIS — I480 Paroxysmal atrial fibrillation: Secondary | ICD-10-CM | POA: Insufficient documentation

## 2022-01-13 DIAGNOSIS — I130 Hypertensive heart and chronic kidney disease with heart failure and stage 1 through stage 4 chronic kidney disease, or unspecified chronic kidney disease: Secondary | ICD-10-CM | POA: Insufficient documentation

## 2022-01-13 DIAGNOSIS — R42 Dizziness and giddiness: Secondary | ICD-10-CM | POA: Diagnosis not present

## 2022-01-13 DIAGNOSIS — I080 Rheumatic disorders of both mitral and aortic valves: Secondary | ICD-10-CM | POA: Diagnosis not present

## 2022-01-13 DIAGNOSIS — I252 Old myocardial infarction: Secondary | ICD-10-CM | POA: Diagnosis not present

## 2022-01-13 DIAGNOSIS — I2582 Chronic total occlusion of coronary artery: Secondary | ICD-10-CM | POA: Insufficient documentation

## 2022-01-13 DIAGNOSIS — G4733 Obstructive sleep apnea (adult) (pediatric): Secondary | ICD-10-CM | POA: Diagnosis not present

## 2022-01-13 DIAGNOSIS — R296 Repeated falls: Secondary | ICD-10-CM | POA: Insufficient documentation

## 2022-01-13 DIAGNOSIS — I447 Left bundle-branch block, unspecified: Secondary | ICD-10-CM | POA: Diagnosis not present

## 2022-01-13 DIAGNOSIS — I2510 Atherosclerotic heart disease of native coronary artery without angina pectoris: Secondary | ICD-10-CM | POA: Diagnosis not present

## 2022-01-13 DIAGNOSIS — E785 Hyperlipidemia, unspecified: Secondary | ICD-10-CM | POA: Diagnosis not present

## 2022-01-13 DIAGNOSIS — Z951 Presence of aortocoronary bypass graft: Secondary | ICD-10-CM | POA: Insufficient documentation

## 2022-01-13 LAB — LIPID PANEL
Cholesterol: 177 mg/dL (ref 0–200)
HDL: 45 mg/dL (ref >40)
LDL Cholesterol: 111 mg/dL — ABNORMAL HIGH (ref 0–99)
Total CHOL/HDL Ratio: 3.9 RATIO
Triglycerides: 107 mg/dL (ref <150)
VLDL: 21 mg/dL (ref 0–40)

## 2022-01-13 LAB — CBC
HCT: 45.1 % (ref 36.0–46.0)
Hemoglobin: 15.2 g/dL — ABNORMAL HIGH (ref 12.0–15.0)
MCH: 31.7 pg (ref 26.0–34.0)
MCHC: 33.7 g/dL (ref 30.0–36.0)
MCV: 94 fL (ref 80.0–100.0)
Platelets: 163 10*3/uL (ref 150–400)
RBC: 4.8 MIL/uL (ref 3.87–5.11)
RDW: 13.1 % (ref 11.5–15.5)
WBC: 5.9 10*3/uL (ref 4.0–10.5)
nRBC: 0 % (ref 0.0–0.2)

## 2022-01-13 LAB — BASIC METABOLIC PANEL
Anion gap: 8 (ref 5–15)
BUN: 12 mg/dL (ref 8–23)
CO2: 26 mmol/L (ref 22–32)
Calcium: 9.1 mg/dL (ref 8.9–10.3)
Chloride: 106 mmol/L (ref 98–111)
Creatinine, Ser: 0.9 mg/dL (ref 0.44–1.00)
GFR, Estimated: >60 mL/min (ref >60)
Glucose, Bld: 106 mg/dL — ABNORMAL HIGH (ref 70–99)
Potassium: 4.4 mmol/L (ref 3.5–5.1)
Sodium: 140 mmol/L (ref 135–145)

## 2022-01-13 MED ORDER — AMLODIPINE BESYLATE 5 MG PO TABS: 5.0000 mg | ORAL_TABLET | Freq: Every day | ORAL | 3 refills | Status: DC

## 2022-01-13 MED ORDER — SEMAGLUTIDE (1 MG/DOSE) 4 MG/3ML ~~LOC~~ SOPN: 1.0000 mg | PEN_INJECTOR | SUBCUTANEOUS | 0 refills | Status: DC

## 2022-01-13 NOTE — Patient Instructions (Signed)
DECREASE Amlodipine to '5mg'$  daily.  Labs done today, your results will be available in MyChart, we will contact you for abnormal readings.  PLEASE CALL THE CUSTOMER SUPPORT LINE TO GET A NEW CARDIOMEMS PILLOW.  Your physician recommends that you schedule a follow-up appointment in: 3 months  If you have any questions or concerns before your next appointment please send [place] a message through [place] or call our office at [phone].    TO LEAVE A MESSAGE FOR THE NURSE SELECT OPTION 2, PLEASE LEAVE A MESSAGE INCLUDING: YOUR NAME DATE OF BIRTH CALL BACK NUMBER REASON FOR CALL**this is important as we prioritize the call backs  YOU WILL RECEIVE A CALL BACK THE SAME DAY AS LONG AS YOU CALL BEFORE 4:00 PM  At the [facility], you and your health needs are our priority. As part of our continuing mission to provide you with exceptional heart care, we have created designated Provider Care Teams. These Care Teams include your primary Cardiologist (physician) and Advanced Practice Providers (APPs- Physician Assistants and Nurse Practitioners) who all work together to provide you with the care you need, when you need it.   You may see any of the following providers on your designated Care Team at your next follow up: Dr Glori Bickers Dr Loralie Champagne Dr. Roxana Hires, NP Lyda Jester, [place] [facility] [place], [place] Forestine Na, NP Audry Riles, PharmD   Please be sure to bring in all your medications bottles to every appointment.

## 2022-01-13 NOTE — Progress Notes (Signed)
Date:  01/13/2022   ID:  [name] [name], DOB September 24, 1945, [name] [id number]   Provider location: [facility] Type of Visit: Established patient   PCP:  Derinda Late, MD  HF Cardiologist:  Dr. [name]  Chief Complaint: Follow up for CAD and CHF.   History of Present Illness: [name] [name] is a 76 y.o. female who has a history of CAD s/p CABG, diastolic CHF, and cerebrovascular disease with history of CVA presents for followup of CHF and diastolic CHF. She had CABG x 5 in 12/11.  Prior to the CABG she had multiple PCIs.  She had a CVA in 2/14 that presented as visual loss.  She had a left carotid stent in 2/15.    Left heart cath done in Sept. 2014. This showed patent SVG-D, LIMA-LAD, and sequential SVG-OM branches but SVG-RCA and the native RCA were both totally occluded. It was felt aht her increased symptoms coincided with occlusion of SVG-RCA.  She was started on Imdur to see if this would help with the chest pain and dyspnea.  However, she feels like Imdur causes leg cramps and does not think that she can take it.  So ranolazine 500 mg bid started and titrated this up to 1000 mg bid.  This helped some but not markedly. Therefore, sent to  Dr. Irish Lack to address opening RCA CTO. He was able to do this in 5/15 with 4 overlapping Xience DES.  This led to resolution of exertional chest pain.     Mrs Stammer was started on Brilinta after PCI.  She became much more short of breath after starting on Brilinta. Brilinta stopped and replaced with Plavix.  Dyspnea improved off Brilinta. Given increasing exertional dyspnea and chest pain,  RHC/LHC was done in 4/17.  This showed stable CAD with no interventional target.  Left and right heart filling pressures were not significantly increased.  Medical management.    She had an MRI/MRA in May 2018 that showed moderately severe stenosis of the supraclinoid segment of the right internal carotid artery, progressed since the 2016 MR  angiogram, and severe basilar artery stenosis.    She developed recurrent exertional chest pain and had LHC in 6/18, showing 4/5 grafts patent with patent native RCA (similar to prior cath, no changes).     Echo 1/19 with EF 55-60%, moderate diastolic dysfunction, PASP 50 mmHg, mildly dilated RV, mild AS, mild-moderate MR.    She reported increased dyspnea and chest pain and had RHC/LHC in 9/19.  She had DES to sequential SVG-OM1/PLOM.  While hospitalized, she was noted to have runs of atrial fibrillation.  She was very symptomatic with the atrial fibrillation.  Eliquis and amiodarone were started.    She was admitted 12/16-12/19/19 for cardiomems implant and RHC. She was diuresed with IV lasix and transitioned to torsemide 100 mg BID + metolazone once/week. DC weight: 295 lbs.   She had a prolonged hospitalization in 2/20 with acute respiratory failure and fever to 103.  She was thought to have PNA and treated with cefepime.  She was intubated.  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  Amiodarone was stopped and she was put on prednisone. Echo in 2/20 showed EF down to 25-30% with mid-apical LV severe hypokinesis. She had AKI. Possible ITP triggered by infection, HIT negative.  ITP was treated with Solumedrol. She was discharged to SNF.    ORIF left ankle 4/20.   11/20 admission  initially with concern for TIAs (staring spells), ended up thinking possible partial seizures.  Head MRI did not show CVA.  Echo in 11/20 showed EF 45-50%, moderately decreased RV systolic function, moderate RV enlargement, moderate-severe MR, moderate TR.   Patient was admitted again in 12/20 with AKI and hypotension in the setting of sinus bradycardia (HR 30s).  She was volume overloaded on exam.  Toprol XL was stopped and HR increased to 60s.  She was diuresed with IV Lasix.  TEE in 12/20 showed EF 50-55%, septal-lateral dyssynchrony, mildly decreased RV function, moderate MR.   Atypical  chest pain in 1/21, Cardiolite showed EF 57%, fixed inferior defect (likely artifact), no ischemia.   Echo in 5/22 showed EF 60-65%, moderate LVH, normal RV, PASP 37 mmHg, 2.5 x 2.1 cm calcified mass posterior mitral annulus with mild-moderate MR => ?severe MAC but more circumscribed and prominent than in the past, mild AS mean gradient 12 mmHg and AVA 1.83 cm^2.  Echo in 11/22 showed EF 55%, normal RV, mass posterior mitral annulus, moderate MR, mild AS, IVC normal.  Cardiac MRI was done in 1/23 to investigate posterior mitral annulus mass; study showed LV EF 48%, mild LV dilation, normal RV with EF 53%, extensive MAC is likely the source of the posterior mitral annulus mass, basal-mid inferior >50% subendocardial LGE suggestive of prior inferior MI.   Follow up 2/23 she had chest tightness x 4 weeks. Arranged for [facility] which showed in-stent restenosis of pRCA, felt to be the source of her symptoms given occluded SVG-RCA. Underwent PTCA to pRCA. RHC with normal PCWP and RA pressure, mild pulmonary hypertension, preserved CO. On ASA + Plavix + Eliquis x 1 month (until 05/21/21), then Plavix + Eliquis.   Today she returns for HF follow up. She has finished cardiac rehab.  Not using Cardiomems, pillow not working.  She reports occasional lightheaded spells and has had falls (but no syncope).  SBP generally around 110 at home.  No BRBPR/melena. Breathing is "up and down."  She has atypical sharp chest pain that will last a few seconds, not exertional.  Not like prior to last PCI.  She walks with walker.  She has dyspnea walking longer distances.  She uses a Nustep for exercise. No palpitations. Weight is down 10 lbs, she is using semaglutide.   ECG (personally reviewed): NSR, IVCD 178 msec, QTc 448 msec  Labs (6/14): K 3.8, creatinine 0.71 Labs (8/14): BNP 249, LDL 166 Labs (9/14): K 4.7, creatinine 0.7 Labs (9/14): K 4.6, creatinine 0.9, BNP 109 Labs (1/15): K 4.5, creatinine 0.73, LDL 212, HCT 43.4, TSH  normal, BNP 138 Labs (6/15): K 4.7, creatinine 1.17, LDL 100, HDL 37, TGs 363, BNP 39 Labs (10/15): K 4.6, creatinine 1.2 Labs (1/16): LDL 157, HDL 43, K 5.2, creatinine 0.95 Labs (2/16): K 4.5, creatinine 1.08, BNP 113 Labs (4/16): LDL 50, HDL 56, TGs 179 Labs (9/16): K 5.3, creatinine 1.09 Labs (10/16): LDL 75, HDL 56 Labs (2/17): K 5.2, creatinine 1.07 Labs (4/17): K 5.1, creatinine 1.07 Labs (5/17): K 4.5, creatinine 1.01, LDL 96, HDL 56, BNP 70 Labs (03/2016) K 4.9, creatinine 1.07 Labs (7/18): K 4.9, creatinine 1.15 Labs (11/18): K 5.1, creatinine 1.11, LDL 36 Labs (1/19): K 4.9, creatinine 1.22 Labs (9/19): LDL 108 Labs (10/19): hgb 14.5, K 4.3, creatinine 1.03 Labs 12/06/2017: K 4.7 Creatinine 1.7 Labs (3/20): K 4.8, creatinine 1.39, hgb 12.4 plts 226 Labs (11/20): K 4.2, creatinine 1.01, LDL 38 Labs (12/20): K 3.5, creatinine  1.8 Labs (3/21): K 4.5, creatinine 1.5 Labs (2/22): K 4.2, creatinine 1.13 Labs (4/22): LDL 58 Labs (9/22): K 4.3, creatinine 1.27 Labs (12/22): K 4.4, creatinine 1.13 Labs (3/23): K 4.3, creatinine 1.02 Labs (5/23): K 4.3, creatinine 1.33 Labs (6/23): BNP 140, K 4.5, creatinine 0.98 Labs (10/23): K 4.2, creatinine 0.86   PMH: 1. Diabetic gastroparesis 2. Type II diabetes 3. HTN 4. Morbid obesity 5. CAD: s/p CABG in 12/11 after prior PCIs.  LIMA-LAD, SVG-D, seq SVG-OM1 and OM2, SVG-PDA. Adenosine Cardiolite (8/14) with EF 53% and a small reversible apical defect with a medium, partially reversible inferior defect.  LHC (9/14) with patent SVG-D, LIMA-LAD (50% distal LAD), and sequential SVG-OM branches; the SVG-RCA and the native RCA were occluded.  This was managed medically initially, but with ongoing exertional chest pain, it was decided to open CTO.  Patient had CTO opening with 4 overlapping Xience DES in the RCA in 5/15.   - LHC (4/17): SVG-D patent, LIMA-LAD patent, distal LAD with several 40-50% stenoses, sequential SVG-OM1 and PLOM patent with  50% proximal stenosis (not flow limiting), SVG-RCA TO with patent RCA stents.  - LHC (6/18): 4/5 grafts patent (SVG-RCA TO, same as past), RCA stents patent => no change.  - LHC (9/19): Sequential SVG-PLOM/OM1 with 80% proximal stenosis, s/p DES.  - Cardiolite (1/21): EF 57%, fixed inferior defect (likely artifact), no ischemia.  - LHC (3/23): severe CAD with 90% in-stent restenosis of pRCA, s/p PTCA to in-stent restenosis of pRCA 6. Atypical migraines 7. OHS/OSA: Intolerant of CPAP. Uses oxygen with exertion and at night.  8. GERD with hiatal hernia 9. OA 10. HFPEF: She has a Cardiomems device.  Echo (11/13) with EF 50-55%, grade II diastolic dysfunction, mild-moderate MR.  Echo (8/14) with EF 55-60%, grade II diastolic dysfunction, mildly increased aortic valve gradient (mean 12 mmHg) but valve opens well, mild MR and mild RV dilation.  Echo (9/15) with EF 60-65%, grade II diastolic dysfunction, mild aortic stenosis, mild mitral stenosis, mild to moderate mitral regurgitation, mildly dilated RV with normal systolic function, PA systolic pressure 46 mmHg.  - RHC (4/17): mean RA 9, PA 38/15 mean 27, mean PCWP 9, CI 2.5.  - Echo (5/17): EF 55-60%, mild LVH, mildly dilated RV with low normal systolic function, moderate TR, PASP 61 mmHg - Echo (1/19): EF 55-60%, moderate diastolic dysfunction, PASP 50 mmHg, mildly dilated RV, mild AS, mild-moderate MR. - RHC (9/19): mean RA 11, PA 34/12, mean PCWP 15, CI 2.85 - Echo (3/20): EF 25-30% with mid-apical severe hypokinesis.  - Echo (11/20): EF 45-50%, moderately dilated RV with moderately decreased systolic function, moderate-severe MR, moderate TR.  - TEE (12/20): EF 50-55%, septal-lateral dyssynchrony, moderate RV dilation/mildly decreased function, peak RV-RA gradient 51 mmHg, moderate MR.  - Echo (5/22): EF 60-65%, moderate LVH, normal RV, PASP 37 mmHg, 2.5 x 2.1 cm calcified mass posterior mitral annulus with mild-moderate MR => ?severe MAC but more  circumscribed and prominent than in the past, mild AS mean gradient 12 mmHg and AVA 1.83 cm^2.  - Echo (11/22): EF 55%, normal RV, mass posterior mitral annulus, moderate MR, mild AS, IVC normal. - Cardiac MRI in 1/23 showed LV EF 48%, mild LV dilation, normal RV with EF 53%, extensive MAC is likely the source of the posterior mitral annulus mass, basal-mid inferior >50% subendocardial LGE suggestive of prior inferior MI.  - RHC (3/23): normal PCWP (14) and RA pressure (2) , mild pulmonary hypertension, preserved CO/CI (6.51/3.28). 11. CKD  12. Chronic LBBB 13. Anxiety 14. Carotid stenosis: Followed by [facility], >12% LICA stenosis 19/75.  She had left carotid stent in 2/15. Carotid dopplers 8/15 with no significant disease. Carotid dopplers (4/16): < 40% RICA stenosis.  - Carotid dopplers (9/22): 1-39% BICA stenosis.  15. Cerebrovascular disease: CVA 2/14 with right posterior cerebral artery territory ischemic infarction. Cerebral angiogram in 6/14 showed 70% right vertebral ostial stenosis, 88-32% LICA stenosis, > 54% proximal left posterior cerebral artery stenosis, posterior communicating artery aneurysm.  Patient has had episodes of transient expressive aphasia.  Carotid dopplers (98/26) showed >41% LICA stenosis. She had a left carotid stent 2/15.  Possible CVA in 7/15. -  MRI/MRA in May 2018 that showed moderately severe stenosis of the supraclinoid segment of the right internal carotid artery, progressed since the 2016 MR angiogram and severe basilar artery stenosis.  16. Positional vertigo (suspected) 17. Palpitations: Holter (6/15) with rare PVCs/PACs.  - Event monitor (2/18): NSR, occasional PVCs - 14 day Zio (9/23) mostly NSR, rare PVCs and PACs, few short runs of SVT, no atrial fibrillation or worrisome arrhythmias. 18. Dyspnea with Brilinta. 19. Melanoma: On face, s/p excision.   20. Aortic stenosis: Mild on 11/22 echo.  21. Posterior mitral annular mass: Cardiac MRI was done in 1/23 to  investigate posterior mitral annulus mass; study showed LV EF 48%, mild LV dilation, normal RV with EF 53%, extensive MAC is likely the source of the posterior mitral annulus mass, basal-mid inferior >50% subendocardial LGE suggestive of prior inferior MI.  22. Lower extremity arterial dopplers (9/16) were normal.  23. Sleep study (4/18): No significant OSA.  24. Atrial fibrillation: Paroxysmal - Amiodarone stopped, ?lung toxicity.  25. H/o ITP in 3/20.  26. Partial seizures 27. Mitral regurgitation: Moderate on TEE in 12/20.  - Mild to moderate on 5/22 echo.  - Moderate on 11/22 echo  Current Outpatient Medications  Medication Sig Dispense Refill   acetaminophen (TYLENOL) 500 MG tablet Take 500 mg by mouth every 6 (six) hours as needed for mild pain, moderate pain, fever or headache.     albuterol (VENTOLIN HFA) 108 (90 Base) MCG/ACT inhaler Inhale 2 puffs into the lungs every 4 (four) hours as needed for wheezing or shortness of breath.      Alirocumab (PRALUENT) 150 MG/ML SOAJ Inject 150 mg into the skin every 14 (fourteen) days. 6 mL 3   ALPRAZolam (XANAX) 0.5 MG tablet Take 1-2 tablets (0.5-1 mg total) by mouth See admin instructions. Take one tablet (0.5 mg) by mouth every morning and two tablets (1 mg) at night, may also take 1 tablet (0.5 mg) midday as needed for anxiety     apixaban (ELIQUIS) 5 MG TABS tablet Take 1 tablet (5 mg total) by mouth 2 (two) times daily. 180 tablet 1   B Complex-C (B-COMPLEX WITH VITAMIN C) tablet Take 1 tablet by mouth in the morning.     bisacodyl (DULCOLAX) 5 MG EC tablet Take 5 mg by mouth daily as needed for moderate constipation.     buPROPion (WELLBUTRIN XL) 300 MG 24 hr tablet Take 300 mg by mouth daily.     carboxymethylcellulose (REFRESH PLUS) 0.5 % SOLN 1 drop in the morning, at noon, and at bedtime.     Cholecalciferol (VITAMIN D-3) 25 MCG (1000 UT) CAPS Take 2,000 Units by mouth daily in the afternoon.     clopidogrel (PLAVIX) 75 MG tablet Take  75 mg by mouth every morning.     denosumab (PROLIA) 60 MG/ML  SOSY injection Inject 60 mg into the skin every 6 (six) months.     DULoxetine (CYMBALTA) 30 MG capsule Take 1 capsule (30 mg total) by mouth daily. 30 capsule 0   ezetimibe (ZETIA) 10 MG tablet Take 1 tablet (10 mg total) by mouth at bedtime. 90 tablet 3   fluocinonide cream (LIDEX) 1.02 % Apply 1 application. topically 2 (two) times daily as needed (irritation).     Fluticasone-Salmeterol (ADVAIR) 250-50 MCG/DOSE AEPB Inhale 1 puff into the lungs daily.     gabapentin (NEURONTIN) 600 MG tablet Take 600-1,200 mg by mouth See admin instructions. Take 1 tablet (600 mg) by mouth in the morning & take 2 tablets  (1200 mg) by mouth at bedtime     Insulin Glargine (LANTUS SOLOSTAR) 100 UNIT/ML Solostar Pen Inject 28-44 Units into the skin at bedtime. Per sliding scale     insulin lispro (HUMALOG) 100 UNIT/ML KwikPen Inject 14-20 Units into the skin See admin instructions. Inject 14 units subcutaneously prior to breakfast and supper; add 4 units for CBG >200     ipratropium-albuterol (DUONEB) 0.5-2.5 (3) MG/3ML SOLN Take 3 mLs by nebulization 2 (two) times daily as needed (asthma).     loperamide (IMODIUM A-D) 2 MG tablet Take 2 mg by mouth 4 (four) times daily as needed for diarrhea or loose stools.     methocarbamol (ROBAXIN) 500 MG tablet Take 500 mg by mouth See admin instructions. Take 1 tablet (500 mg) by mouth scheduled at bedtime & may take 2 additional doses during the day if needed for back pain.     Multiple Vitamin (MULTIVITAMIN WITH MINERALS) TABS tablet Take 1 tablet by mouth every morning. Centrum - Women over 55     Multiple Vitamins-Minerals (OCUVITE EYE HEALTH FORMULA) CAPS Take 1 capsule by mouth every morning.     nitroGLYCERIN (NITROSTAT) 0.3 MG SL tablet Place 1 tablet (0.3 mg total) under the tongue every 5 (five) minutes x 3 doses as needed for chest pain. 25 tablet 1   ondansetron (ZOFRAN-ODT) 4 MG disintegrating tablet  Take 4 mg by mouth 2 (two) times daily as needed for nausea or vomiting.     OXYGEN Inhale 2 L into the lungs at bedtime as needed (shortness of breath).      pantoprazole (PROTONIX) 40 MG tablet Take 1 tablet (40 mg total) by mouth daily. 30 tablet 0   Pyridoxine HCl (VITAMIN B-6 PO) Take 1 tablet by mouth every morning.     ranolazine (RANEXA) 1000 MG SR tablet TAKE ONE TABLET BY MOUTH TWICE DAILY 180 tablet 3   rosuvastatin (CRESTOR) 40 MG tablet Take 1 tablet (40 mg total) by mouth daily. 90 tablet 3   sacubitril-valsartan (ENTRESTO) 97-103 MG Take 1 tablet by mouth 2 (two) times daily. 180 tablet 3   spironolactone (ALDACTONE) 25 MG tablet Take 25 mg by mouth daily.     torsemide (DEMADEX) 20 MG tablet Take 0.5 tablets (10 mg total) by mouth daily. 15 tablet 2   traZODone (DESYREL) 50 MG tablet Take 1 tablet (50 mg total) by mouth at bedtime. 90 tablet 1   vitamin B-12 (CYANOCOBALAMIN) 1000 MCG tablet Take 1,000 mcg by mouth every morning.      zolpidem (AMBIEN) 10 MG tablet Take 10 mg by mouth at bedtime.     amLODipine (NORVASC) 5 MG tablet Take 1 tablet (5 mg total) by mouth at bedtime. 90 tablet 3   Semaglutide, 1 MG/DOSE, 4 MG/3ML SOPN Inject 1 mg  into the skin once a week. 3 mL 0   No current facility-administered medications for this encounter.    Allergies:   Amoxicillin, Brilinta [ticagrelor], Erythromycin, Flagyl [metronidazole], Penicillins, Isosorbide mononitrate [isosorbide nitrate], Jardiance [empagliflozin], Metformin and related, and Tape   Social History:  The patient  reports that she has never smoked. She has never used smokeless tobacco. She reports that she does not drink alcohol and does not use drugs.   Family History:  The patient's family history includes AAA (abdominal aortic aneurysm) in her father and mother; Cancer in her sister; Deep vein thrombosis in her father and mother; Dementia in her maternal grandmother and mother; Diabetes in her father, paternal  aunt, paternal grandmother, paternal uncle, and paternal uncle; Heart attack in her father; Heart attack (age of onset: 46) in her paternal grandfather; Heart disease in her father and paternal uncle; Hyperlipidemia in her father; Hypertension in her father, mother, and sister.   ROS:  Please see the history of present illness.   All other systems are personally reviewed and negative.   Recent Labs: 09/15/2021: B Natriuretic Peptide 195.9 01/13/2022: BUN 12; Creatinine, Ser 0.90; Hemoglobin 15.2; Platelets 163; Potassium 4.4; Sodium 140  Personally reviewed   Wt Readings from Last 3 Encounters:  01/13/22 99.3 kg (219 lb)  12/16/21 101.4 kg (223 lb 8.7 oz)  10/27/21 104 kg (229 lb 3.2 oz)    BP 120/70   Pulse (!) 47   Wt 99.3 kg (219 lb)   SpO2 98%   BMI 40.38 kg/m   Physical Exam:   General: NAD Neck: No JVD, no thyromegaly or thyroid nodule.  Lungs: Clear to auscultation bilaterally with normal respiratory effort. CV: Nondisplaced PMI.  Heart regular S1/S2, no S3/S4, no murmur.  No peripheral edema.  No carotid bruit.  Normal pedal pulses.  Abdomen: Soft, nontender, no hepatosplenomegaly, no distention.  Skin: Intact without lesions or rashes.  Neurologic: Alert and oriented x 3.  Psych: Normal affect. Extremities: No clubbing or cyanosis.  HEENT: Normal.   Assessment & Plan: 1. CAD: Occluded native RCA and SVG-RCA.  Status post opening of CTO RCA with 4 overlapping Xience DES in 5/15.  DES to sequential SVG-OM1/PLOM in 9/19. LHC in 3/23 showed severe native coronary disease with 90% in-stent restenosis in the proximal RCA (patient has 2 layers of stent at this site). The sequential SVG-OM1/PLOM was patent, SVG-D was patent, and LIMA-LAD was patent. She underwent PTCA of pRCA. She continues to have occasional atypical chest pain but not her typical ischemic pain.  - Continue apixaban and Plavix (has been on chronic Plavix, neuro has wanted her to stay on this for cerebrovascular  disease).   - Off Imdur with headache. - Off Toprol XL with bradycardia.   - Continue ranolazine 1000 mg bid.   2. Chronic systolic => diastolic CHF: Echo in 4/27 with EF 55-60%, moderate diastolic dysfunction. Cardiomems placed 01/16/18.  Echo in 3/20 in setting of severe PNA with intubation showed EF 25-30%, mid-apical LV severe hypokinesis.  Possible stress (Takotsubo-type) cardiomyopathy related to severe medical illness versus progression of CAD.  She has baseline chronic LBBB which could play a role in cardiomyopathy as well (LBBB cardiomyopathy).  Repeat echo in 11/20 with EF up to 45-50%, moderately dysfunctional RV.  TEE in 12/20 with EF 50-55% with septal-lateral dyssynchrony and mildly dysfunctional RV.  Most recent echo in 11/22 with EF 55% with normal RV.  cMRI in 1/23 with EF 48% and evidence for prior inferior  MI.  Ongoing NYHA class III symptoms, she did not improve post-PTCA RCA.   She does not appear volume overloaded on exam.  Cardiomems pillow does not appear to be functioning.  - She will call Cardiomems to see if she can get a new pillow.  - Continue spiro 25 mg daily. BMET today - Continue torsemide 10 mg daily. - Continue Entresto 97/103 mg bid.  - Cannot tolerate SGLT2i. - Not on beta blocker due to bradycardia.  3. Hyperlipidemia: She remains on Zetia, Crestor and Praluent.  - Followed in lipid clinic.  - Check lipids today.  4. Hypertension: BP controlled. She has orthostatic symptoms and has fallen.  - Decrease amlodipine to 5 mg daily.  5. Cerebrovascular disease: She has history of CVA and had left carotid stent in 2/15. Followed by [facility].  Last MRA head showed severe stenosis of supraclinoid RICA and severe basilar artery stenosis.  - Neurology would like her to continue Plavix for this despite concomitant apixaban use.  6. OHS/OSA: Cannot tolerate CPAP.  Uses oxygen at night.  7. Atrial fibrillation: Paroxysmal. She had suspected amiodarone lung toxicity and is now  off amiodarone. No recent palpitations, NSR today.  - Continue apixaban 5 mg bid. No bleeding issues. 8. Obesity: Body mass index is 40.38 kg/m. - She is now on Ozempic, weight coming down. 9. Mitral regurgitation: Moderate on 11/22 echo.  10. Bradycardia: Now off Toprol XL.   11. Aortic stenosis: Mild on 11/22 echo.  12. Mitral annular mass: This is a well-circumscribed calcified posterior mitral annulus mass noted on 5/22 and 11/22 echoes.  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  Follow up in 3 months with APP  Signed, Loralie Champagne, MD  01/13/2022  [facility] [address] [facility] [place] [address] [phone] (office) [phone] (fax)

## 2022-01-15 ENCOUNTER — Other Ambulatory Visit (HOSPITAL_COMMUNITY): Payer: Self-pay

## 2022-01-15 ENCOUNTER — Other Ambulatory Visit (HOSPITAL_COMMUNITY): Payer: Self-pay | Admitting: Cardiology

## 2022-01-15 ENCOUNTER — Telehealth (HOSPITAL_COMMUNITY): Payer: Self-pay

## 2022-01-15 DIAGNOSIS — E782 Mixed hyperlipidemia: Secondary | ICD-10-CM

## 2022-01-15 NOTE — Telephone Encounter (Signed)
Patient confirmed she is taking all three medications. I have set up referral to lipid clinic and patient is agreeable.

## 2022-02-08 ENCOUNTER — Other Ambulatory Visit (HOSPITAL_COMMUNITY): Payer: Self-pay

## 2022-02-09 ENCOUNTER — Telehealth: Payer: Self-pay | Admitting: Pharmacist

## 2022-02-09 NOTE — Telephone Encounter (Signed)
Patient called to re-order Ozempic 1 mg  Reports having upset stomach in about 1-3 days after taking 1 mg Ozempic shot. Last week she had projectile vomiting on 3rd day of injection. She did not eat large or high carb or high fat meal. She is in process of ruling out other causes of her stomach problems. Had MRI last week and will be seeing PCP this afternoon to discussed the result.   Patient to call back tomorrow to discuss whether to keep her on 1 mg dose or reduce to 0.5 mg once weekly

## 2022-02-10 MED ORDER — SEMAGLUTIDE (1 MG/DOSE) 4 MG/3ML ~~LOC~~ SOPN: 1.0000 mg | PEN_INJECTOR | SUBCUTANEOUS | 0 refills | Status: DC

## 2022-02-10 NOTE — Addendum Note (Signed)
Addended by: Marcelle Overlie D on: 02/10/2022 12:00 PM   Modules accepted: Orders

## 2022-02-10 NOTE — Telephone Encounter (Signed)
Patient called back she thought about what strength she would like to be on for Ozempic.  She states that when she was out of town is when she was very sick and throwing up.  Thinks it was from eating out at restaurants and eating food that she does not typically eat even though she tried to choose the healthiest option.  Since she has been home she has not had any vomiting just a little nausea.  She would like to stick with Ozempic 1 mg.  Will send Rx to [facility].  Patient will call us with any issues.  She is also seeing GI soon.

## 2022-02-22 ENCOUNTER — Other Ambulatory Visit (HOSPITAL_COMMUNITY): Payer: Self-pay | Admitting: Cardiology

## 2022-03-08 ENCOUNTER — Encounter: Payer: Self-pay | Admitting: Adult Health

## 2022-03-08 ENCOUNTER — Encounter: Payer: Self-pay | Admitting: Internal Medicine

## 2022-03-08 ENCOUNTER — Ambulatory Visit (INDEPENDENT_AMBULATORY_CARE_PROVIDER_SITE_OTHER): Payer: Medicare Other | Admitting: Internal Medicine

## 2022-03-08 ENCOUNTER — Ambulatory Visit (INDEPENDENT_AMBULATORY_CARE_PROVIDER_SITE_OTHER): Payer: Medicare Other | Admitting: Adult Health

## 2022-03-08 VITALS — BP 149/62 | HR 51 | Ht 61.0 in | Wt 216.6 lb

## 2022-03-08 VITALS — BP 136/80 | HR 57 | Ht 61.0 in | Wt 216.0 lb

## 2022-03-08 DIAGNOSIS — R112 Nausea with vomiting, unspecified: Secondary | ICD-10-CM | POA: Diagnosis not present

## 2022-03-08 DIAGNOSIS — R109 Unspecified abdominal pain: Secondary | ICD-10-CM | POA: Diagnosis not present

## 2022-03-08 DIAGNOSIS — R935 Abnormal findings on diagnostic imaging of other abdominal regions, including retroperitoneum: Secondary | ICD-10-CM | POA: Diagnosis not present

## 2022-03-08 DIAGNOSIS — R413 Other amnesia: Secondary | ICD-10-CM | POA: Diagnosis not present

## 2022-03-08 DIAGNOSIS — Z8673 Personal history of transient ischemic attack (TIA), and cerebral infarction without residual deficits: Secondary | ICD-10-CM

## 2022-03-08 DIAGNOSIS — K219 Gastro-esophageal reflux disease without esophagitis: Secondary | ICD-10-CM

## 2022-03-08 NOTE — Progress Notes (Signed)
HISTORY OF PRESENT ILLNESS:  [name] [name] is a 77 y.o. female with MULTIPLE SIGNIFICANT medical problems including morbid obesity, coronary artery disease, history of TIA/CVA on chronic Plavix and aspirin, congestive heart failure, chronic Eliquis therapy, and multiple prior surgeries including remote gastric stapling procedure.  She also has a history of chronic intermittent nausea and vomiting, gastroparesis, and GERD. The patient 2017 and most recently March 2018.  See that dictation for details.  After prolonged hospitalization patient lost about 100 pounds.  This past fall she initiated Ozempic therapy.  In December she had problems with postprandial projectile vomiting of undigested food.  Epigastric discomfort.  She was sent for evaluation including CT scan of the abdomen pelvis with contrast.  She is status post cholecystectomy.  She was noted to have increase in CBD diameter over the past 24 years.  Current diameter at 10 mm.  She was also noted to have thickening of the proximal stomach at the site of prior stapling.  Consideration of endoscopy for correlation was recommended.  She is now sent for that reason.  Patient tells me that she has not really had chronic problems with intermittent nausea and vomiting.  This is worse with recent escalating doses of Ozempic.  She also has postprandial diarrhea.  She is lost about 20 pounds since initiating Ozempic therapy.  She does have history of GERD for which she takes pantoprazole once daily.  No problems today..  She does use oxygen at night, as needed, based on pulse ox readings.  She has required this much less frequently since significant weight loss  REVIEW OF SYSTEMS:  All non-GI ROS negative unless otherwise stated in the HPI except for sinus and allergy trouble, anxiety, arthritis, back pain, visual change, confusion, cough, depression, fatigue, fever, headaches, heart murmur, muscle cramps, shortness of breath, skin rash, sleeping problems,  ankle edema, excessive urination, urinary leakage, voice change  Past Medical History:  Diagnosis Date   Anemia    hx   Anxiety    Asthma    Basal cell carcinoma 05/2014   "left shoulder"   Bundle branch block, left    chronic/notes 07/18/2013   CHF (congestive heart failure) (HCC)    Chronic insomnia 05/06/2015   Chronic kidney disease    frequency, sees dr cynthia dunham every 4 to 6 months (01/16/2018)   Chronic lower back pain    Claustrophobia    Common migraine 05/14/2014   Coronary artery disease    MI in 2001, 2002, 2006, 2011, 2014   Depression    Diabetic peripheral neuropathy (HCC) 01/12/2019   GERD (gastroesophageal reflux disease)    H/O hiatal hernia    Headache    "at least 2/month" (01/16/2018)   Heart murmur    Hyperlipidemia    Hypertension    Memory change 05/14/2014   Migraine    "5-6/year"  (01/16/2018)   Obesity 01-2010   Obstructive sleep apnea    "can't wear machine; I have claustrophobia" (01/16/2018), states she had a 2nd sleep study and does not have sleep apnea, her O2 decreases and now is on 2 L of O2 at night.   On home oxygen therapy    "2L at night and prn during daytime" (01/16/2018)   Osteoarthritis    "knees and hands" (01/16/2018)   Peripheral vascular disease (HCC)    ? numbness, tingling arms and legs   PONV (postoperative nausea and vomiting)    Stroke (HCC)    2014, 2015, 2016     Type II diabetes mellitus (HCC)    Ventral hernia    hx of    Past Surgical History:  Procedure Laterality Date   ANKLE FRACTURE SURGERY Left 1970's   APPENDECTOMY  1970's   w/hysterectomy   BASAL CELL CARCINOMA EXCISION Left 05/2014   "shoulder" (01/16/2018)   CARDIAC CATHETERIZATION  10/10/2012   Dr McLean.   CARDIAC CATHETERIZATION N/A 05/29/2015   Procedure: Right/Left Heart Cath and Coronary/Graft Angiography;  Surgeon: Dalton S McLean, MD;  Location: [facility];  Service: Cardiovascular;  Laterality: N/A;   CAROTID ENDARTERECTOMY Left  03/2013   CAROTID STENT INSERTION Left 03/20/2013   Procedure: CAROTID STENT INSERTION;  Surgeon: Vance W Brabham, MD;  Location: [facility];  Service: Cardiovascular;  Laterality: Left;  internal carotid   CEREBRAL ANGIOGRAM N/A 04/05/2011   Procedure: CEREBRAL ANGIOGRAM;  Surgeon: Christopher S Dickson, MD;  Location: [facility];  Service: Cardiovascular;  Laterality: N/A;   CHOLECYSTECTOMY OPEN  2004   CORONARY ANGIOPLASTY WITH STENT PLACEMENT  01,02,05,06,07,08,11; 04/24/2013   "I've probably got ~ 10 stents by now" (04/24/2013)   CORONARY ANGIOPLASTY WITH STENT PLACEMENT  06/13/2013   "got 4 stents today" (06/13/2013)   CORONARY ARTERY BYPASS GRAFT  1220/11   "CABG X5"   CORONARY BALLOON ANGIOPLASTY N/A 04/20/2021   Procedure: CORONARY BALLOON ANGIOPLASTY;  Surgeon: Berry, Jonathan J, MD;  Location: [facility];  Service: Cardiovascular;  Laterality: N/A;   CORONARY STENT INTERVENTION N/A 10/20/2017   Procedure: CORONARY STENT INTERVENTION;  Surgeon: Kelly, Thomas A, MD;  Location: [facility];  Service: Cardiovascular;  Laterality: N/A;   CORONARY STENT INTERVENTION N/A 04/20/2021   Procedure: CORONARY STENT INTERVENTION;  Surgeon: Berry, Jonathan J, MD;  Location: [facility];  Service: Cardiovascular;  Laterality: N/A;   ESOPHAGOGASTRODUODENOSCOPY  08/03/2011   Procedure: ESOPHAGOGASTRODUODENOSCOPY (EGD);  Surgeon: David H Newman, MD;  Location: [facility];  Service: General;  Laterality: N/A;   ESOPHAGOGASTRODUODENOSCOPY (EGD) WITH PROPOFOL N/A 03/11/2014   Procedure: ESOPHAGOGASTRODUODENOSCOPY (EGD) WITH PROPOFOL;  Surgeon: Dora M Brodie, MD;  Location: [facility];  Service: Endoscopy;  Laterality: N/A;   FRACTURE SURGERY     gall stone removal  05/2003   GASTRIC RESTRICTION SURGERY  1984   "stapeling"   HERNIA REPAIR  2004   "in my stomach; had OR on it twice", wire mesh on 1 hernia   LEFT HEART CATH AND CORS/GRAFTS ANGIOGRAPHY N/A 07/09/2016   Procedure: LEFT HEART  CATH AND CORS/GRAFTS ANGIOGRAPHY;  Surgeon: McLean, Dalton S, MD;  Location: [facility];  Service: Cardiovascular;  Laterality: N/A;   ORIF ANKLE FRACTURE Left 05/16/2018   ORIF ANKLE FRACTURE Left 05/16/2018   Procedure: OPEN REDUCTION INTERNAL FIXATION (ORIF) Left ankle with possible syndesmosis fixation;  Surgeon: Rogers, Jason Patrick, MD;  Location: [facility];  Service: Orthopedics;  Laterality: Left;  120min   OVARY SURGERY  1970's   "tumor removed"   PERCUTANEOUS CORONARY STENT INTERVENTION (PCI-S) N/A 06/13/2013   Procedure: PERCUTANEOUS CORONARY STENT INTERVENTION (PCI-S);  Surgeon: Jayadeep S Varanasi, MD;  Location: [facility];  Service: Cardiovascular;  Laterality: N/A;   PERCUTANEOUS STENT INTERVENTION N/A 04/24/2013   Procedure: PERCUTANEOUS STENT INTERVENTION;  Surgeon: Jayadeep S Varanasi, MD;  Location: [facility];  Service: Cardiovascular;  Laterality: N/A;   RIGHT/LEFT HEART CATH AND CORONARY/GRAFT ANGIOGRAPHY N/A 10/20/2017   Procedure: RIGHT/LEFT HEART CATH AND CORONARY/GRAFT ANGIOGRAPHY;  Surgeon: McLean, Dalton S, MD;  Location: [facility];  Service: Cardiovascular;  Laterality: N/A;   RIGHT/LEFT HEART CATH AND CORONARY/GRAFT ANGIOGRAPHY N/A 04/20/2021   Procedure: RIGHT/LEFT HEART CATH AND CORONARY/GRAFT ANGIOGRAPHY;  Surgeon: Berry, Jonathan J, MD;  Location: [facility];  Service: Cardiovascular;  Laterality: N/A;   ROOT CANAL  10/2000   TEE WITHOUT CARDIOVERSION N/A 01/18/2019   Procedure: TRANSESOPHAGEAL ECHOCARDIOGRAM (TEE);  Surgeon: McLean, Dalton S, MD;  Location: [facility];  Service: Cardiovascular;  Laterality: N/A;   TIBIA FRACTURE SURGERY Right 1970's   rods and pins   TOOTH EXTRACTION     "1 on the upper; wisdom tooth on the lower" (01/16/2018)   TOTAL ABDOMINAL HYSTERECTOMY  1970's   w/ appendectomy    Social History Yeilin J Class  reports that she has never smoked. She has never used smokeless tobacco. She reports that she does not  drink alcohol and does not use drugs.  family history includes AAA (abdominal aortic aneurysm) in her father and mother; Cancer in her sister; Deep vein thrombosis in her father and mother; Dementia in her maternal grandmother and mother; Diabetes in her father, paternal aunt, paternal grandmother, paternal uncle, and paternal uncle; Heart attack in her father; Heart attack (age of onset: 58) in her paternal grandfather; Heart disease in her father and paternal uncle; Hyperlipidemia in her father; Hypertension in her father, mother, and sister.  Allergies  Allergen Reactions   Amoxicillin Shortness Of Breath and Rash   Brilinta [Ticagrelor] Shortness Of Breath   Erythromycin Shortness Of Breath, Other (See Comments) and Hives    Trouble swallowing   Flagyl [Metronidazole] Shortness Of Breath and Palpitations   Penicillins Hives, Shortness Of Breath, Rash and Other (See Comments)    Has patient had a PCN reaction causing immediate rash, facial/tongue/throat swelling, SOB or lightheadedness with hypotension: Yes Has patient had a PCN reaction causing severe rash involving mucus membranes or skin necrosis: No Has patient had a PCN reaction that required hospitalization: Yes Has patient had a PCN reaction occurring within the last 10 years: No If all of the above answers are "NO", then may proceed with Cephalosporin use.    Isosorbide Mononitrate [Isosorbide Nitrate] Other (See Comments)    Joint aches, muscles hurt, difficult to walk    Jardiance [Empagliflozin] Other (See Comments)    Nausea, joint aches, muscles aches   Metformin And Related Other (See Comments)    Stomach pain, cold sweats, joint pain, burred vision, dizziness   Tape Other (See Comments)    Skin pulls off with certain types Plastic tape causes skin to rip if left on for long periods of time       PHYSICAL EXAMINATION: Vital signs: BP 136/80   Pulse (!) 57   Ht 5' 1" (1.549 m)   Wt 216 lb (98 kg)   BMI 40.81 kg/m    Constitutional: Chronically ill-appearing, obese, no acute distress Psychiatric: alert and oriented x3, cooperative Eyes: extraocular movements intact, anicteric, conjunctiva pink Mouth: oral pharynx moist, no lesions Neck: supple no lymphadenopathy Cardiovascular: heart regular rate and rhythm, no murmur Lungs: clear to auscultation bilaterally Abdomen: soft, obese, nontender, nondistended, no obvious ascites, no peritoneal signs, normal bowel sounds, no organomegaly Rectal: Omitted Extremities: no clubbing, cyanosis, or lower extremity edema bilaterally Skin: no lesions on visible extremities Neuro: No focal deficits.  Cranial nerves intact  ASSESSMENT:  1.  Problems with nausea and vomiting as described.  Most likely related to Ozempic 2.  Gastric pain.  Intermittent.  Likely related to   the same 3.  Abnormal CT scan.  Suspect postoperative change.  Rule out more ominous diagnoses 4.  Multiple significant medical problems as described.  On chronic Plavix.  On chronic anticoagulation therapy.  On home oxygen as needed   PLAN:  1.  Diagnostic upper endoscopy.  The patient is HIGH RISK given her myriad of comorbidities and medications.The nature of the procedure, as well as the risks, benefits, and alternatives were carefully and thoroughly reviewed with the patient. Ample time for discussion and questions allowed. The patient understood, was satisfied, and agreed to proceed. 2.  The procedure will be performed at the hospital with monitored anesthesia care 3.  Hold Ozempic 1 week prior to procedure.  Advised. 4.  Okay to stay on antiplatelet therapy and anticoagulation therapy as this will be a diagnostic exam. 5.  Continue PPI 6.  Reflux precautions 7.  Ongoing general medical care with PCP and other specialists A total time of 60 minutes was spent preparing to see the patient, reviewing a myriad of outside records, x-rays, laboratories, and prior endoscopy reports.  Obtaining  comprehensive history, performing medically appropriate physical examination, ordering high risk endoscopic procedure, counseling and educating the patient regarding the above listed issues, and documenting clinical information in the health record     

## 2022-03-08 NOTE — Progress Notes (Signed)
PATIENT: [name] [name] DOB: 1945-09-21  REASON FOR VISIT: follow up HISTORY FROM: patient PRIMARY NEUROLOGIST: Dr. Jannifer Franklin  Chief Complaint  Patient presents with   Follow-up    Pt in 81 Pt here for stroke f/u Pt states cognitive is worse Pt states increased stuttering      HISTORY OF PRESENT ILLNESS: Today 03/08/22:  [name] [name] is a 77 y.o. female with a history of Stroke. Returns today for follow-up. Feels that memory is getting worse- can't remember as quick as she could. Stuttering is worse. States that some days she doesn't have it at all. Leans more to the right when ambulating. Uses the rollator when ambulating. Reports that she had a fall three weeks ago getting out of the shower. Fortunately no injuries. Continues on eliquis and plavix. Did not hit her head when she fell. Cardiology and PCP is managing BP and Hyperlipidemia. Will be having an EGD after findings on a CT scan (due to throwing up).       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  States she is doing better.  She still has some intermittent stuttering but she has learned to concentrate and overcome it.  She is still has some headaches mostly on the left side as well as some vision difficulties and dizziness but these seem to be improving as well.  She was seen in the ED on 06/02/2021 with an episode of hypoglycemia which was due to accidental increased dose of insulin that she had taken.  She states she mixed up her short acting and long-acting insulin and use around the amount by mistake.  She has done well since then and has had no further mixup or hypoglycemic episodes.  She was also admitted in March 2023 with unstable angina.  She had a cardiac cath in April and switch to Eliquis and Plavix which is tolerating well without major bleeding but does bruise easily.  Blood pressure she states is usually well controlled but today it is elevated in office at 168/78.  She is  tolerating Praluent injections well and last lipid profile checked by primary care physician and March shows satisfactory.  She uses a rollator to walk and does well usually though she has had a few occasional falls when she is bending down.  She states her balance is poor.  She has no new complaints.  She had a EEG done on 03/26/2021 which was normal.  MRI scan of the brain on 03/12/2021 showed no acute abnormality.  Old right PCA infarct and bilateral subcortical lacunar infarcts of remote age.   02/26/21: [name] [name] is a 77 year old female with a history of diabetes associated with diabetic peripheral neuropathy, history of cerebrovascular disease with right posterior cerebral artery distribution stroke in the past.  She returns today for follow-up.  She states that she feels that she has had TIA events since the last visit.  She states that she feels as if she blacks out and does not remember what just happened.  She states that in the last 4 weeks she has noticed increased numbness and tingling on the left side of the body.  Reports that she had a fall Saturday bruising her left arm.  Did not go to the PCP or ED.  She is participating in physical therapy for balance and gait.  Continues to have stuttering speech off and on.  Remains on Eliquis.  Follows with cardiology and her PCP regularly.  HISTORY [name] is a 77 year old right-handed white female with a history of diabetes associated with diabetic peripheral neuropathy.  The patient has history of cerebrovascular disease, she sustained a right posterior cerebral artery distribution stroke in the past.  She does not operate a motor vehicle at this time.  She reports some ongoing decline in memory with short-term memory issues.  She has some dizziness oftentimes with standing.  She uses a walker for ambulation, she has not had any recent falls.  She continues to have some stuttering speech off and on.  She returns for further evaluation.  She did have a  carotid Doppler study in August 2021 that was unremarkable  REVIEW OF SYSTEMS: Out of a complete 14 system review of symptoms, the patient complains only of the following symptoms, and all other reviewed systems are negative.  ALLERGIES: Allergies  Allergen Reactions   Amoxicillin Shortness Of Breath and Rash   Brilinta [Ticagrelor] Shortness Of Breath   Erythromycin Shortness Of Breath, Other (See Comments) and Hives    Trouble swallowing   Flagyl [Metronidazole] Shortness Of Breath and Palpitations   Penicillins Hives, Shortness Of Breath, Rash and Other (See Comments)    Has patient had a PCN reaction causing immediate rash, facial/tongue/throat swelling, SOB or lightheadedness with hypotension: Yes Has patient had a PCN reaction causing severe rash involving mucus membranes or skin necrosis: No Has patient had a PCN reaction that required hospitalization: Yes Has patient had a PCN reaction occurring within the last 10 years: No If all of the above answers are "NO", then may proceed with Cephalosporin use.    Isosorbide Mononitrate [Isosorbide Nitrate] Other (See Comments)    Joint aches, muscles hurt, difficult to walk    Jardiance [Empagliflozin] Other (See Comments)    Nausea, joint aches, muscles aches   Metformin And Related Other (See Comments)    Stomach pain, cold sweats, joint pain, burred vision, dizziness   Tape Other (See Comments)    Skin pulls off with certain types Plastic tape causes skin to rip if left on for long periods of time    HOME MEDICATIONS: Outpatient Medications Prior to Visit  Medication Sig Dispense Refill   acetaminophen (TYLENOL) 500 MG tablet Take 500 mg by mouth every 6 (six) hours as needed for mild pain, moderate pain, fever or headache.     albuterol (VENTOLIN HFA) 108 (90 Base) MCG/ACT inhaler Inhale 2 puffs into the lungs every 4 (four) hours as needed for wheezing or shortness of breath.      Alirocumab (PRALUENT) 150 MG/ML SOAJ Inject  150 mg into the skin every 14 (fourteen) days. 6 mL 3   ALPRAZolam (XANAX) 0.5 MG tablet Take 1-2 tablets (0.5-1 mg total) by mouth See admin instructions. Take one tablet (0.5 mg) by mouth every morning and two tablets (1 mg) at night, may also take 1 tablet (0.5 mg) midday as needed for anxiety     amLODipine (NORVASC) 5 MG tablet Take 1 tablet (5 mg total) by mouth at bedtime. 90 tablet 3   apixaban (ELIQUIS) 5 MG TABS tablet Take 1 tablet (5 mg total) by mouth 2 (two) times daily. 180 tablet 1   B Complex-C (B-COMPLEX WITH VITAMIN C) tablet Take 1 tablet by mouth in the morning.     bisacodyl (DULCOLAX) 5 MG EC tablet Take 5 mg by mouth daily as needed for moderate constipation.     buPROPion (WELLBUTRIN XL) 300 MG 24 hr tablet Take 300 mg by  mouth daily.     carboxymethylcellulose (REFRESH PLUS) 0.5 % SOLN 1 drop in the morning, at noon, and at bedtime.     Cholecalciferol (VITAMIN D-3) 25 MCG (1000 UT) CAPS Take 2,000 Units by mouth daily in the afternoon.     clopidogrel (PLAVIX) 75 MG tablet Take 75 mg by mouth every morning.     denosumab (PROLIA) 60 MG/ML SOSY injection Inject 60 mg into the skin every 6 (six) months.     DULoxetine (CYMBALTA) 30 MG capsule Take 1 capsule (30 mg total) by mouth daily. 30 capsule 0   ezetimibe (ZETIA) 10 MG tablet Take 1 tablet (10 mg total) by mouth at bedtime. 90 tablet 3   fluocinonide cream (LIDEX) 1.74 % Apply 1 application. topically 2 (two) times daily as needed (irritation).     Fluticasone-Salmeterol (ADVAIR) 250-50 MCG/DOSE AEPB Inhale 1 puff into the lungs daily.     gabapentin (NEURONTIN) 600 MG tablet Take 600-1,200 mg by mouth See admin instructions. Take 1 tablet (600 mg) by mouth in the morning & take 2 tablets  (1200 mg) by mouth at bedtime     Insulin Glargine (LANTUS SOLOSTAR) 100 UNIT/ML Solostar Pen Inject 28-44 Units into the skin at bedtime. Per sliding scale     insulin lispro (HUMALOG) 100 UNIT/ML KwikPen Inject 14-20 Units into the  skin See admin instructions. Inject 14 units subcutaneously prior to breakfast and supper; add 4 units for CBG >200     loperamide (IMODIUM A-D) 2 MG tablet Take 2 mg by mouth 4 (four) times daily as needed for diarrhea or loose stools.     methocarbamol (ROBAXIN) 500 MG tablet Take 500 mg by mouth See admin instructions. Take 1 tablet (500 mg) by mouth scheduled at bedtime & may take 2 additional doses during the day if needed for back pain.     Multiple Vitamin (MULTIVITAMIN WITH MINERALS) TABS tablet Take 1 tablet by mouth every morning. Centrum - Women over 55     Multiple Vitamins-Minerals (OCUVITE EYE HEALTH FORMULA) CAPS Take 1 capsule by mouth every morning.     nitroGLYCERIN (NITROSTAT) 0.3 MG SL tablet Place 1 tablet (0.3 mg total) under the tongue every 5 (five) minutes x 3 doses as needed for chest pain. 25 tablet 1   ondansetron (ZOFRAN-ODT) 4 MG disintegrating tablet Take 4 mg by mouth 2 (two) times daily as needed for nausea or vomiting.     OXYGEN Inhale 2 L into the lungs at bedtime as needed (shortness of breath).      pantoprazole (PROTONIX) 40 MG tablet Take 1 tablet (40 mg total) by mouth daily. 30 tablet 0   Pyridoxine HCl (VITAMIN B-6 PO) Take 1 tablet by mouth every morning.     ranolazine (RANEXA) 1000 MG SR tablet TAKE ONE TABLET BY MOUTH TWICE DAILY 180 tablet 3   rosuvastatin (CRESTOR) 40 MG tablet Take 1 tablet (40 mg total) by mouth daily. 90 tablet 3   sacubitril-valsartan (ENTRESTO) 97-103 MG Take 1 tablet by mouth 2 (two) times daily. 180 tablet 3   Semaglutide, 1 MG/DOSE, 4 MG/3ML SOPN Inject 1 mg into the skin once a week. 3 mL 0   spironolactone (ALDACTONE) 25 MG tablet TAKE 1 TABLET BY MOUTH DAILY 90 tablet 3   torsemide (DEMADEX) 10 MG tablet TAKE ONE (1) TABLET BY MOUTH EVERY DAY 90 tablet 3   traZODone (DESYREL) 50 MG tablet Take 1 tablet (50 mg total) by mouth at bedtime. 90 tablet 1  vitamin B-12 (CYANOCOBALAMIN) 1000 MCG tablet Take 1,000 mcg by mouth  every morning.      zolpidem (AMBIEN) 10 MG tablet Take 10 mg by mouth at bedtime.     ipratropium-albuterol (DUONEB) 0.5-2.5 (3) MG/3ML SOLN Take 3 mLs by nebulization 2 (two) times daily as needed (asthma).     torsemide (DEMADEX) 20 MG tablet Take 0.5 tablets (10 mg total) by mouth daily. 15 tablet 2   No facility-administered medications prior to visit.    PAST MEDICAL HISTORY: Past Medical History:  Diagnosis Date   Anemia    hx   Anxiety    Asthma    Basal cell carcinoma 05/2014   "left shoulder"   Bundle branch block, left    chronic/notes 07/18/2013   CHF (congestive heart failure) (HCC)    Chronic insomnia 05/06/2015   Chronic kidney disease    frequency, sees dr Jamal Maes every 4 to 6 months (01/16/2018)   Chronic lower back pain    Claustrophobia    Common migraine 05/14/2014   Coronary artery disease    MI in 2001, 2002, 2006, 2011, 2014   Depression    Diabetic peripheral neuropathy ([place]) 01/12/2019   GERD (gastroesophageal reflux disease)    H/O hiatal hernia    Headache    "at least 2/month" (01/16/2018)   Heart murmur    Hyperlipidemia    Hypertension    Memory change 05/14/2014   Migraine    "5-6/year"  (01/16/2018)   Obesity 01-2010   Obstructive sleep apnea    "can't wear machine; I have claustrophobia" (01/16/2018), states she had a 2nd sleep study and does not have sleep apnea, her O2 decreases and now is on 2 L of O2 at night.   On home oxygen therapy    "2L at night and prn during daytime" (01/16/2018)   Osteoarthritis    "knees and hands" (01/16/2018)   Peripheral vascular disease ([place])    ? numbness, tingling arms and legs   PONV (postoperative nausea and vomiting)    Stroke ([place])    2014, 2015, 2016   Type II diabetes mellitus ([place])    Ventral hernia    hx of    PAST SURGICAL HISTORY: Past Surgical History:  Procedure Laterality Date   ANKLE FRACTURE SURGERY Left 1970's   APPENDECTOMY  1970's   w/hysterectomy   BASAL CELL  CARCINOMA EXCISION Left 05/2014   "shoulder" (01/16/2018)   CARDIAC CATHETERIZATION  10/10/2012   Dr [name].   CARDIAC CATHETERIZATION N/A 05/29/2015   Procedure: Right/Left Heart Cath and Coronary/Graft Angiography;  Surgeon: Larey Dresser, MD;  Location: [place] [facility];  Service: Cardiovascular;  Laterality: N/A;   CAROTID ENDARTERECTOMY Left 03/2013   CAROTID STENT INSERTION Left 03/20/2013   Procedure: CAROTID STENT INSERTION;  Surgeon: [name], MD;  Location: [facility] CATH LAB;  Service: Cardiovascular;  Laterality: Left;  internal carotid   CEREBRAL ANGIOGRAM N/A 04/05/2011   Procedure: CEREBRAL ANGIOGRAM;  Surgeon: Angelia Mould, MD;  Location: [facility] CATH LAB;  Service: Cardiovascular;  Laterality: N/A;   CHOLECYSTECTOMY OPEN  2004   CORONARY ANGIOPLASTY WITH STENT PLACEMENT  01,02,05,06,07,08,11; 04/24/2013   "I've probably got ~ 10 stents by now" (04/24/2013)   CORONARY ANGIOPLASTY WITH STENT PLACEMENT  06/13/2013   "got 4 stents today" (06/13/2013)   CORONARY ARTERY BYPASS GRAFT  1220/11   "CABG X5"   CORONARY BALLOON ANGIOPLASTY N/A 04/20/2021   Procedure: CORONARY BALLOON ANGIOPLASTY;  Surgeon: Lorretta Harp,  MD;  Location: [place] [facility];  Service: Cardiovascular;  Laterality: N/A;   CORONARY STENT INTERVENTION N/A 10/20/2017   Procedure: CORONARY STENT INTERVENTION;  Surgeon: Troy Sine, MD;  Location: [place] [facility];  Service: Cardiovascular;  Laterality: N/A;   CORONARY STENT INTERVENTION N/A 04/20/2021   Procedure: CORONARY STENT INTERVENTION;  Surgeon: Lorretta Harp, MD;  Location: [place] [facility];  Service: Cardiovascular;  Laterality: N/A;   ESOPHAGOGASTRODUODENOSCOPY  08/03/2011   Procedure: ESOPHAGOGASTRODUODENOSCOPY (EGD);  Surgeon: Shann Medal, MD;  Location: [facility];  Service: General;  Laterality: N/A;   ESOPHAGOGASTRODUODENOSCOPY (EGD) WITH PROPOFOL N/A 03/11/2014   Procedure: ESOPHAGOGASTRODUODENOSCOPY (EGD) WITH PROPOFOL;  Surgeon:  [place] Dragon, MD;  Location: [facility];  Service: Endoscopy;  Laterality: N/A;   FRACTURE SURGERY     gall stone removal  05/2003   GASTRIC RESTRICTION SURGERY  1984   "stapeling"   HERNIA REPAIR  2004   "in my stomach; had OR on it twice", wire mesh on 1 hernia   LEFT HEART CATH AND CORS/GRAFTS ANGIOGRAPHY N/A 07/09/2016   Procedure: LEFT HEART CATH AND CORS/GRAFTS ANGIOGRAPHY;  Surgeon: Larey Dresser, MD;  Location: [place] [facility];  Service: Cardiovascular;  Laterality: N/A;   ORIF ANKLE FRACTURE Left 05/16/2018   ORIF ANKLE FRACTURE Left 05/16/2018   Procedure: OPEN REDUCTION INTERNAL FIXATION (ORIF) Left ankle with possible syndesmosis fixation;  Surgeon: Nicholes Stairs, MD;  Location: [place];  Service: Orthopedics;  Laterality: Left;  18mn   OVARY SURGERY  1970's   "tumor removed"   PERCUTANEOUS CORONARY STENT INTERVENTION (PCI-S) N/A 06/13/2013   Procedure: PERCUTANEOUS CORONARY STENT INTERVENTION (PCI-S);  Surgeon: JJettie Booze MD;  [facility];  Service: Cardiovascular;  Laterality: N/A;   PERCUTANEOUS STENT INTERVENTION N/A 04/24/2013   Procedure: PERCUTANEOUS STENT INTERVENTION;  Surgeon: JJettie Booze MD;  [facility];  Service: Cardiovascular;  Laterality: N/A;   RIGHT/LEFT HEART CATH AND CORONARY/GRAFT ANGIOGRAPHY N/A 10/20/2017   Procedure: RIGHT/LEFT HEART CATH AND CORONARY/GRAFT ANGIOGRAPHY;  Surgeon: MLarey Dresser MD;  [facility];  Service: Cardiovascular;  Laterality: N/A;   RIGHT/LEFT HEART CATH AND CORONARY/GRAFT ANGIOGRAPHY N/A 04/20/2021   Procedure: RIGHT/LEFT HEART CATH AND CORONARY/GRAFT ANGIOGRAPHY;  Surgeon: BLorretta Harp MD;  [facility];  Service: Cardiovascular;  Laterality: N/A;   ROOT CANAL  10/2000   TEE WITHOUT CARDIOVERSION N/A 01/18/2019   Procedure: TRANSESOPHAGEAL ECHOCARDIOGRAM (TEE);  Surgeon: MLarey Dresser MD;  [facility];  Service: Cardiovascular;   Laterality: N/A;   TIBIA FRACTURE SURGERY Right 1970's   rods and pins   TOOTH EXTRACTION     "1 on the upper; wisdom tooth on the lower" (01/16/2018)   TOTAL ABDOMINAL HYSTERECTOMY  1970's   w/ appendectomy    FAMILY HISTORY: Family History  Problem Relation Age of Onset   Hypertension Mother    Deep vein thrombosis Mother    AAA (abdominal aortic aneurysm) Mother    Dementia Mother    Heart disease Father        Heart Disease before age 75  Diabetes Father    Hyperlipidemia Father    Hypertension Father    Heart attack Father    Deep vein thrombosis Father    AAA (abdominal aortic aneurysm) Father    Cancer Sister        Ovarian   Hypertension Sister    Diabetes Paternal Aunt  Heart disease Paternal Uncle    Diabetes Paternal Uncle    Diabetes Paternal Uncle    Dementia Maternal Grandmother    Diabetes Paternal Grandmother    Heart attack Paternal Grandfather 6       died of MI at 57   Colon cancer Neg Hx     SOCIAL HISTORY: Social History   Socioeconomic History   Marital status: Married    Spouse name: [name]    Number of children: 1   Years of education: 12+   Highest education level: Not on file  Occupational History   Occupation: retired   Occupation: retired  Tobacco Use   Smoking status: Never   Smokeless tobacco: Never  Scientific laboratory technician Use: Never used  Substance and Sexual Activity   Alcohol use: Never   Drug use: Never   Sexual activity: Not Currently    Birth control/protection: Surgical    Comment: hysterectomy  Other Topics Concern   Not on file  Social History Narrative   Patient lives at home with husband [name].    Patient is right handed.   Patient drinks caffeine occasionally.   Social Determinants of Health   Financial Resource Strain: Not on file  Food Insecurity: Not on file  Transportation Needs: Not on file  Physical Activity: Not on file  Stress: Not on file  Social Connections: Not on file  Intimate Partner  Violence: Not on file      PHYSICAL EXAM  There were no vitals filed for this visit.  There is no height or weight on file to calculate BMI.     02/26/2021   11:07 AM  MMSE - Mini Mental State Exam  Orientation to time 3  Orientation to Place 4  Registration 3  Attention/ Calculation 2  Recall 3  Language- name 2 objects 2  Language- repeat 1  Language- follow 3 step command 3  Language- read & follow direction 1  Write a sentence 1  Copy design 0  Total score 23     Generalized: Well developed, in no acute distress   Neurological examination  Mentation: Alert oriented to time, place, history taking. Follows all commands intermittent stuttering  Cranial nerve II-XII: Pupils were equal round reactive to light. Extraocular movements were full, visual field were full on confrontational test. Facial sensation and strength were normal. Head turning and shoulder shrug  were normal and symmetric. Motor: The motor testing reveals 5 over 5 strength in all extremities.  Sensory: Sensory testing is intact to soft touch on all 4 extremities. No evidence of extinction is noted.  Gait and station: Uses a Rollator when ambulating.  Tandem gait not attempted.   DIAGNOSTIC DATA (LABS, IMAGING, TESTING) - I reviewed patient records, labs, notes, testing and imaging myself where available.  Lab Results  Component Value Date   WBC 5.9 01/13/2022   HGB 15.2 (H) 01/13/2022   HCT 45.1 01/13/2022   MCV 94.0 01/13/2022   PLT 163 01/13/2022      Component Value Date/Time   NA 140 01/13/2022 1213   K 4.4 01/13/2022 1213   CL 106 01/13/2022 1213   CO2 26 01/13/2022 1213   GLUCOSE 106 (H) 01/13/2022 1213   BUN 12 01/13/2022 1213   CREATININE 0.90 01/13/2022 1213   CREATININE 1.07 (H) 05/28/2015 1230   CALCIUM 9.1 01/13/2022 1213   PROT 6.7 12/23/2018 1339   PROT 6.5 11/13/2014 1151   ALBUMIN 3.5 12/23/2018 1339   ALBUMIN 4.3  11/13/2014 1151   AST 23 12/23/2018 1339   ALT 17  12/23/2018 1339   ALKPHOS 84 12/23/2018 1339   BILITOT 0.5 12/23/2018 1339   BILITOT 0.5 11/13/2014 1151   GFRNONAA >60 01/13/2022 1213   GFRAA 50 (L) 11/05/2019 1244   Lab Results  Component Value Date   CHOL 177 01/13/2022   HDL 45 01/13/2022   LDLCALC 111 (H) 01/13/2022   LDLDIRECT 156.7 02/06/2014   TRIG 107 01/13/2022   CHOLHDL 3.9 01/13/2022   Lab Results  Component Value Date   HGBA1C 7.2 (H) 01/17/2019   No results found for: "VITAMINB12" Lab Results  Component Value Date   TSH 0.982 01/17/2019      ASSESSMENT AND PLAN 77 y.o. year old female  has a past medical history of Anemia, Anxiety, Asthma, Basal cell carcinoma (05/2014), Bundle branch block, left, CHF (congestive heart failure) ([facility]), Chronic insomnia (05/06/2015), Chronic kidney disease, Chronic lower back pain, Claustrophobia, Common migraine (05/14/2014), Coronary artery disease, Depression, Diabetic peripheral neuropathy ([place]) (01/12/2019), GERD (gastroesophageal reflux disease), H/O hiatal hernia, Headache, Heart murmur, Hyperlipidemia, Hypertension, Memory change (05/14/2014), Migraine, Obesity (01-2010), Obstructive sleep apnea, On home oxygen therapy, Osteoarthritis, Peripheral vascular disease ([place]), PONV (postoperative nausea and vomiting), Stroke ([place]), Type II diabetes mellitus ([place]), and Ventral hernia. here with:  1.  History of stroke    Continue clopidogrel 75 mg daily and Eliquis (apixaban) daily    for secondary stroke prevention.  Discussed secondary stroke prevention measures and importance of close PCP follow up for aggressive stroke risk factor management. I have gone over the pathophysiology of stroke, warning signs and symptoms, risk factors and their management in some detail with instructions to go to the closest emergency room for symptoms of concern. HTN: BP goal <130/90.   HLD: LDL goal <70.  DMII: A1c goal<7.0. Encouraged patient to monitor diet and encouraged exercise   3.  Memory  disturbance  Stable MMSE today 27 out of 30 previously 23/30  Encouraged patient to keep regular follow-ups with her PCP and cardiologist.  She can follow-up with our office on an as-needed basis     Ward Givens, MSN, NP-C 03/08/2022, 1:30 PM [facility] [address], [place],  [address] [phone]

## 2022-03-08 NOTE — Patient Instructions (Addendum)
_______________________________________________________  If your blood pressure at your visit was 140/90 or greater, please contact your primary care physician to follow up on this.  _______________________________________________________  If you are age 75 or older, your body mass index should be between 23-30. Your Body mass index is 40.81 kg/m. If this is out of the aforementioned range listed, please consider follow up with your Primary Care Provider.  If you are age 68 or younger, your body mass index should be between 19-25. Your Body mass index is 40.81 kg/m. If this is out of the aformentioned range listed, please consider follow up with your Primary Care Provider.   ________________________________________________________  The  GI providers would like to encourage you to use [facility] to communicate with providers for non-urgent requests or questions.  Due to long hold times on the telephone, sending your provider a message by [facility] may be a faster and more efficient way to get a response.  Please allow 48 business hours for a response.  Please remember that this is for non-urgent requests.  _______________________________________________________  Dennis Bast have been scheduled for an endoscopy. Please follow written instructions given to you at your visit today. If you use inhalers (even only as needed), please bring them with you on the day of your procedure.  Hold Ozempic 1 week before procedure.  CONTINUE TO TAKE YOUR PLAVIX AND ELIQUIS. DO NOT HOLD YOUR BLOOD THINNERS FOR THIS PROCEDURE AS DISCUSSED WITH DR. PERRY.   Thank you for entrusting me with your care and choosing [facility].  Dr Henrene Pastor

## 2022-03-08 NOTE — H&P (View-Only) (Signed)
HISTORY OF PRESENT ILLNESS:  [name] [name] is a 77 y.o. female with MULTIPLE SIGNIFICANT medical problems including morbid obesity, coronary artery disease, history of TIA/CVA on chronic Plavix and aspirin, congestive heart failure, chronic Eliquis therapy, and multiple prior surgeries including remote gastric stapling procedure.  She also has a history of chronic intermittent nausea and vomiting, gastroparesis, and GERD. The patient 2017 and most recently March 2018.  See that dictation for details.  After prolonged hospitalization patient lost about 100 pounds.  This past fall she initiated Ozempic therapy.  In December she had problems with postprandial projectile vomiting of undigested food.  Epigastric discomfort.  She was sent for evaluation including CT scan of the abdomen pelvis with contrast.  She is status post cholecystectomy.  She was noted to have increase in CBD diameter over the past 24 years.  Current diameter at 10 mm.  She was also noted to have thickening of the proximal stomach at the site of prior stapling.  Consideration of endoscopy for correlation was recommended.  She is now sent for that reason.  Patient tells me that she has not really had chronic problems with intermittent nausea and vomiting.  This is worse with recent escalating doses of Ozempic.  She also has postprandial diarrhea.  She is lost about 20 pounds since initiating Ozempic therapy.  She does have history of GERD for which she takes pantoprazole once daily.  No problems today..  She does use oxygen at night, as needed, based on pulse ox readings.  She has required this much less frequently since significant weight loss  REVIEW OF SYSTEMS:  All non-GI ROS negative unless otherwise stated in the HPI except for sinus and allergy trouble, anxiety, arthritis, back pain, visual change, confusion, cough, depression, fatigue, fever, headaches, heart murmur, muscle cramps, shortness of breath, skin rash, sleeping problems,  ankle edema, excessive urination, urinary leakage, voice change  Past Medical History:  Diagnosis Date   Anemia    hx   Anxiety    Asthma    Basal cell carcinoma 05/2014   "left shoulder"   Bundle branch block, left    chronic/notes 07/18/2013   CHF (congestive heart failure) (HCC)    Chronic insomnia 05/06/2015   Chronic kidney disease    frequency, sees dr Jamal Maes every 4 to 6 months (01/16/2018)   Chronic lower back pain    Claustrophobia    Common migraine 05/14/2014   Coronary artery disease    MI in 2001, 2002, 2006, 2011, 2014   Depression    Diabetic peripheral neuropathy ([place]) 01/12/2019   GERD (gastroesophageal reflux disease)    H/O hiatal hernia    Headache    "at least 2/month" (01/16/2018)   Heart murmur    Hyperlipidemia    Hypertension    Memory change 05/14/2014   Migraine    "5-6/year"  (01/16/2018)   Obesity 01-2010   Obstructive sleep apnea    "can't wear machine; I have claustrophobia" (01/16/2018), states she had a 2nd sleep study and does not have sleep apnea, her O2 decreases and now is on 2 L of O2 at night.   On home oxygen therapy    "2L at night and prn during daytime" (01/16/2018)   Osteoarthritis    "knees and hands" (01/16/2018)   Peripheral vascular disease (HCC)    ? numbness, tingling arms and legs   PONV (postoperative nausea and vomiting)    Stroke ([place])    2014, 2015, 2016  Type II diabetes mellitus (HCC)    Ventral hernia    hx of    Past Surgical History:  Procedure Laterality Date   ANKLE FRACTURE SURGERY Left 1970's   APPENDECTOMY  1970's   w/hysterectomy   BASAL CELL CARCINOMA EXCISION Left 05/2014   "shoulder" (01/16/2018)   CARDIAC CATHETERIZATION  10/10/2012   Dr [name].   CARDIAC CATHETERIZATION N/A 05/29/2015   Procedure: Right/Left Heart Cath and Coronary/Graft Angiography;  Surgeon: Larey Dresser, MD;  Location: [place] [facility];  Service: Cardiovascular;  Laterality: N/A;   CAROTID ENDARTERECTOMY Left  03/2013   CAROTID STENT INSERTION Left 03/20/2013   Procedure: CAROTID STENT INSERTION;  Surgeon: [name], MD;  Location: [facility] CATH LAB;  Service: Cardiovascular;  Laterality: Left;  internal carotid   CEREBRAL ANGIOGRAM N/A 04/05/2011   Procedure: CEREBRAL ANGIOGRAM;  Surgeon: Angelia Mould, MD;  Location:  [facility] CATH LAB;  Service: Cardiovascular;  Laterality: N/A;   CHOLECYSTECTOMY OPEN  2004   CORONARY ANGIOPLASTY WITH STENT PLACEMENT  01,02,05,06,07,08,11; 04/24/2013   "I've probably got ~ 10 stents by now" (04/24/2013)   CORONARY ANGIOPLASTY WITH STENT PLACEMENT  06/13/2013   "got 4 stents today" (06/13/2013)   CORONARY ARTERY BYPASS GRAFT  1220/11   "CABG X5"   CORONARY BALLOON ANGIOPLASTY N/A 04/20/2021   Procedure: CORONARY BALLOON ANGIOPLASTY;  Surgeon: Lorretta Harp, MD;  Location: [place] [facility];  Service: Cardiovascular;  Laterality: N/A;   CORONARY STENT INTERVENTION N/A 10/20/2017   Procedure: CORONARY STENT INTERVENTION;  Surgeon: Troy Sine, MD;  Location: [place] [facility];  Service: Cardiovascular;  Laterality: N/A;   CORONARY STENT INTERVENTION N/A 04/20/2021   Procedure: CORONARY STENT INTERVENTION;  Surgeon: Lorretta Harp, MD;  Location: [place] [facility];  Service: Cardiovascular;  Laterality: N/A;   ESOPHAGOGASTRODUODENOSCOPY  08/03/2011   Procedure: ESOPHAGOGASTRODUODENOSCOPY (EGD);  Surgeon: Shann Medal, MD;  Location: [facility];  Service: General;  Laterality: N/A;   ESOPHAGOGASTRODUODENOSCOPY (EGD) WITH PROPOFOL N/A 03/11/2014   Procedure: ESOPHAGOGASTRODUODENOSCOPY (EGD) WITH PROPOFOL;  Surgeon: Lafayette Dragon, MD;  Location: [facility] ENDOS[facility];  Service: Endoscopy;  Laterality: N/A;   FRACTURE SURGERY     gall stone removal  05/2003   GASTRIC RESTRICTION SURGERY  1984   "stapeling"   HERNIA REPAIR  2004   "in my stomach; had OR on it twice", wire mesh on 1 hernia   LEFT HEART CATH AND CORS/GRAFTS ANGIOGRAPHY N/A 07/09/2016   Procedure: LEFT HEART  CATH AND CORS/GRAFTS ANGIOGRAPHY;  Surgeon: Larey Dresser, MD;  Location: [place] [facility];  Service: Cardiovascular;  Laterality: N/A;   ORIF ANKLE FRACTURE Left 05/16/2018   ORIF ANKLE FRACTURE Left 05/16/2018   Procedure: OPEN REDUCTION INTERNAL FIXATION (ORIF) Left ankle with possible syndesmosis fixation;  Surgeon: Nicholes Stairs, MD;  Location: [place];  Service: Orthopedics;  Laterality: Left;  148mn   OVARY SURGERY  1970's   "tumor removed"   PERCUTANEOUS CORONARY STENT INTERVENTION (PCI-S) N/A 06/13/2013   Procedure: PERCUTANEOUS CORONARY STENT INTERVENTION (PCI-S);  Surgeon: JJettie Booze MD;  [facility];  Service: Cardiovascular;  Laterality: N/A;   PERCUTANEOUS STENT INTERVENTION N/A 04/24/2013   Procedure: PERCUTANEOUS STENT INTERVENTION;  Surgeon: JJettie Booze MD;  [facility];  Service: Cardiovascular;  Laterality: N/A;   RIGHT/LEFT HEART CATH AND CORONARY/GRAFT ANGIOGRAPHY N/A 10/20/2017   Procedure: RIGHT/LEFT HEART CATH AND CORONARY/GRAFT ANGIOGRAPHY;  Surgeon: MLarey Dresser MD;  [facility];  Service: Cardiovascular;  Laterality: N/A;   RIGHT/LEFT HEART CATH AND CORONARY/GRAFT ANGIOGRAPHY N/A 04/20/2021   Procedure: RIGHT/LEFT HEART CATH AND CORONARY/GRAFT ANGIOGRAPHY;  Surgeon: Lorretta Harp, MD;  Location: [place] [facility];  Service: Cardiovascular;  Laterality: N/A;   ROOT CANAL  10/2000   TEE WITHOUT CARDIOVERSION N/A 01/18/2019   Procedure: TRANSESOPHAGEAL ECHOCARDIOGRAM (TEE);  Surgeon: Larey Dresser, MD;  Location: [facility] ENDOSCOPY;  Service: Cardiovascular;  Laterality: N/A;   TIBIA FRACTURE SURGERY Right 1970's   rods and pins   TOOTH EXTRACTION     "1 on the upper; wisdom tooth on the lower" (01/16/2018)   TOTAL ABDOMINAL HYSTERECTOMY  1970's   w/ appendectomy    Social History JOHANNAH BOLHUIS  reports that she has never smoked. She has never used smokeless tobacco. She reports that she does not  drink alcohol and does not use drugs.  family history includes AAA (abdominal aortic aneurysm) in her father and mother; Cancer in her sister; Deep vein thrombosis in her father and mother; Dementia in her maternal grandmother and mother; Diabetes in her father, paternal aunt, paternal grandmother, paternal uncle, and paternal uncle; Heart attack in her father; Heart attack (age of onset: 57) in her paternal grandfather; Heart disease in her father and paternal uncle; Hyperlipidemia in her father; Hypertension in her father, mother, and sister.  Allergies  Allergen Reactions   Amoxicillin Shortness Of Breath and Rash   Brilinta [Ticagrelor] Shortness Of Breath   Erythromycin Shortness Of Breath, Other (See Comments) and Hives    Trouble swallowing   Flagyl [Metronidazole] Shortness Of Breath and Palpitations   Penicillins Hives, Shortness Of Breath, Rash and Other (See Comments)    Has patient had a PCN reaction causing immediate rash, facial/tongue/throat swelling, SOB or lightheadedness with hypotension: Yes Has patient had a PCN reaction causing severe rash involving mucus membranes or skin necrosis: No Has patient had a PCN reaction that required hospitalization: Yes Has patient had a PCN reaction occurring within the last 10 years: No If all of the above answers are "NO", then may proceed with Cephalosporin use.    Isosorbide Mononitrate [Isosorbide Nitrate] Other (See Comments)    Joint aches, muscles hurt, difficult to walk    Jardiance [Empagliflozin] Other (See Comments)    Nausea, joint aches, muscles aches   Metformin And Related Other (See Comments)    Stomach pain, cold sweats, joint pain, burred vision, dizziness   Tape Other (See Comments)    Skin pulls off with certain types Plastic tape causes skin to rip if left on for long periods of time       PHYSICAL EXAMINATION: Vital signs: BP 136/80   Pulse (!) 57   Ht '5\' 1"'$  (1.549 m)   Wt 216 lb (98 kg)   BMI 40.81 kg/m    Constitutional: Chronically ill-appearing, obese, no acute distress Psychiatric: alert and oriented x3, cooperative Eyes: extraocular movements intact, anicteric, conjunctiva pink Mouth: oral pharynx moist, no lesions Neck: supple no lymphadenopathy Cardiovascular: heart regular rate and rhythm, no murmur Lungs: clear to auscultation bilaterally Abdomen: soft, obese, nontender, nondistended, no obvious ascites, no peritoneal signs, normal bowel sounds, no organomegaly Rectal: Omitted Extremities: no clubbing, cyanosis, or lower extremity edema bilaterally Skin: no lesions on visible extremities Neuro: No focal deficits.  Cranial nerves intact  ASSESSMENT:  1.  Problems with nausea and vomiting as described.  Most likely related to Ozempic 2.  Gastric pain.  Intermittent.  Likely related to  the same 3.  Abnormal CT scan.  Suspect postoperative change.  Rule out more ominous diagnoses 4.  Multiple significant medical problems as described.  On chronic Plavix.  On chronic anticoagulation therapy.  On home oxygen as needed   PLAN:  1.  Diagnostic upper endoscopy.  The patient is HIGH RISK given her myriad of comorbidities and medications.The nature of the procedure, as well as the risks, benefits, and alternatives were carefully and thoroughly reviewed with the patient. Ample time for discussion and questions allowed. The patient understood, was satisfied, and agreed to proceed. 2.  The procedure will be performed at the hospital with monitored anesthesia care 3.  Hold Ozempic 1 week prior to procedure.  Advised. 4.  Okay to stay on antiplatelet therapy and anticoagulation therapy as this will be a diagnostic exam. 5.  Continue PPI 6.  Reflux precautions 7.  Ongoing general medical care with PCP and other specialists A total time of 60 minutes was spent preparing to see the patient, reviewing a myriad of outside records, x-rays, laboratories, and prior endoscopy reports.  Obtaining  comprehensive history, performing medically appropriate physical examination, ordering high risk endoscopic procedure, counseling and educating the patient regarding the above listed issues, and documenting clinical information in the health record

## 2022-03-15 ENCOUNTER — Telehealth: Payer: Self-pay | Admitting: Pharmacist

## 2022-03-15 MED ORDER — OZEMPIC (2 MG/DOSE) 8 MG/3ML ~~LOC~~ SOPN: 2.0000 mg | PEN_INJECTOR | SUBCUTANEOUS | 11 refills | Status: DC

## 2022-03-15 NOTE — Telephone Encounter (Signed)
Pt called clinic asking for Ozempic refill. Clarified dosing, she tolerated 45m well this past month. Previously had issues but was out of town and had to eat out more. She wishes to refill the 245mdose. Refill sent in.

## 2022-03-23 ENCOUNTER — Encounter: Payer: Self-pay | Admitting: *Deleted

## 2022-03-23 ENCOUNTER — Encounter (HOSPITAL_COMMUNITY): Payer: Self-pay | Admitting: Internal Medicine

## 2022-03-30 ENCOUNTER — Ambulatory Visit (HOSPITAL_BASED_OUTPATIENT_CLINIC_OR_DEPARTMENT_OTHER): Payer: Medicare Other | Admitting: Certified Registered"

## 2022-03-30 ENCOUNTER — Ambulatory Visit (HOSPITAL_COMMUNITY)
Admission: RE | Admit: 2022-03-30 | Discharge: 2022-03-30 | Disposition: A | Discharge status: Home / Self Care | Payer: Medicare Other | Source: Ambulatory Visit | Attending: Internal Medicine | Admitting: Internal Medicine

## 2022-03-30 ENCOUNTER — Other Ambulatory Visit: Payer: Self-pay

## 2022-03-30 ENCOUNTER — Ambulatory Visit (HOSPITAL_COMMUNITY): Payer: Medicare Other | Admitting: Certified Registered"

## 2022-03-30 ENCOUNTER — Encounter (HOSPITAL_COMMUNITY)
Admission: RE | Disposition: A | Discharge status: Home / Self Care | Payer: Self-pay | Source: Ambulatory Visit | Attending: Internal Medicine

## 2022-03-30 ENCOUNTER — Encounter (HOSPITAL_COMMUNITY): Payer: Self-pay | Admitting: Internal Medicine

## 2022-03-30 DIAGNOSIS — Z9981 Dependence on supplemental oxygen: Secondary | ICD-10-CM | POA: Diagnosis not present

## 2022-03-30 DIAGNOSIS — I5042 Chronic combined systolic (congestive) and diastolic (congestive) heart failure: Secondary | ICD-10-CM

## 2022-03-30 DIAGNOSIS — R933 Abnormal findings on diagnostic imaging of other parts of digestive tract: Secondary | ICD-10-CM | POA: Insufficient documentation

## 2022-03-30 DIAGNOSIS — R109 Unspecified abdominal pain: Secondary | ICD-10-CM | POA: Diagnosis not present

## 2022-03-30 DIAGNOSIS — Z955 Presence of coronary angioplasty implant and graft: Secondary | ICD-10-CM | POA: Diagnosis not present

## 2022-03-30 DIAGNOSIS — R112 Nausea with vomiting, unspecified: Secondary | ICD-10-CM

## 2022-03-30 DIAGNOSIS — R197 Diarrhea, unspecified: Secondary | ICD-10-CM | POA: Insufficient documentation

## 2022-03-30 DIAGNOSIS — K219 Gastro-esophageal reflux disease without esophagitis: Secondary | ICD-10-CM | POA: Diagnosis not present

## 2022-03-30 DIAGNOSIS — I252 Old myocardial infarction: Secondary | ICD-10-CM | POA: Diagnosis not present

## 2022-03-30 DIAGNOSIS — Z9884 Bariatric surgery status: Secondary | ICD-10-CM | POA: Insufficient documentation

## 2022-03-30 DIAGNOSIS — Z7902 Long term (current) use of antithrombotics/antiplatelets: Secondary | ICD-10-CM | POA: Insufficient documentation

## 2022-03-30 DIAGNOSIS — K449 Diaphragmatic hernia without obstruction or gangrene: Secondary | ICD-10-CM | POA: Diagnosis not present

## 2022-03-30 DIAGNOSIS — E1143 Type 2 diabetes mellitus with diabetic autonomic (poly)neuropathy: Secondary | ICD-10-CM | POA: Diagnosis not present

## 2022-03-30 DIAGNOSIS — Z7985 Long-term (current) use of injectable non-insulin antidiabetic drugs: Secondary | ICD-10-CM | POA: Diagnosis not present

## 2022-03-30 DIAGNOSIS — E1151 Type 2 diabetes mellitus with diabetic peripheral angiopathy without gangrene: Secondary | ICD-10-CM | POA: Insufficient documentation

## 2022-03-30 DIAGNOSIS — K3184 Gastroparesis: Secondary | ICD-10-CM | POA: Diagnosis not present

## 2022-03-30 DIAGNOSIS — Z9889 Other specified postprocedural states: Secondary | ICD-10-CM | POA: Insufficient documentation

## 2022-03-30 DIAGNOSIS — I110 Hypertensive heart disease with heart failure: Secondary | ICD-10-CM | POA: Insufficient documentation

## 2022-03-30 DIAGNOSIS — Z7982 Long term (current) use of aspirin: Secondary | ICD-10-CM | POA: Diagnosis not present

## 2022-03-30 DIAGNOSIS — Z7901 Long term (current) use of anticoagulants: Secondary | ICD-10-CM | POA: Insufficient documentation

## 2022-03-30 DIAGNOSIS — Z8673 Personal history of transient ischemic attack (TIA), and cerebral infarction without residual deficits: Secondary | ICD-10-CM | POA: Insufficient documentation

## 2022-03-30 DIAGNOSIS — I2510 Atherosclerotic heart disease of native coronary artery without angina pectoris: Secondary | ICD-10-CM | POA: Insufficient documentation

## 2022-03-30 DIAGNOSIS — I509 Heart failure, unspecified: Secondary | ICD-10-CM | POA: Insufficient documentation

## 2022-03-30 DIAGNOSIS — Z9049 Acquired absence of other specified parts of digestive tract: Secondary | ICD-10-CM | POA: Insufficient documentation

## 2022-03-30 DIAGNOSIS — Z6841 Body Mass Index (BMI) 40.0 and over, adult: Secondary | ICD-10-CM | POA: Insufficient documentation

## 2022-03-30 DIAGNOSIS — Z951 Presence of aortocoronary bypass graft: Secondary | ICD-10-CM | POA: Insufficient documentation

## 2022-03-30 HISTORY — PX: ESOPHAGOGASTRODUODENOSCOPY (EGD) WITH PROPOFOL: SHX5813

## 2022-03-30 LAB — GLUCOSE, CAPILLARY: Glucose-Capillary: 134 mg/dL — ABNORMAL HIGH (ref 70–99)

## 2022-03-30 SURGERY — ESOPHAGOGASTRODUODENOSCOPY (EGD) WITH PROPOFOL
Anesthesia: Monitor Anesthesia Care

## 2022-03-30 MED ORDER — LACTATED RINGERS IV SOLN: INTRAVENOUS | Status: DC

## 2022-03-30 MED ORDER — LIDOCAINE 2% (20 MG/ML) 5 ML SYRINGE
INTRAMUSCULAR | Status: DC | PRN
  Administered 2022-03-30: 60 mg via INTRAVENOUS
  Administered 2022-03-30: 40 mg via INTRAVENOUS

## 2022-03-30 MED ORDER — PROPOFOL 10 MG/ML IV BOLUS
INTRAVENOUS | Status: DC | PRN
  Administered 2022-03-30: 30 mg via INTRAVENOUS
  Administered 2022-03-30 (×2): 50 mg via INTRAVENOUS

## 2022-03-30 SURGICAL SUPPLY — 15 items

## 2022-03-30 NOTE — Anesthesia Postprocedure Evaluation (Signed)
Anesthesia Post Note  Patient: [name] [name]  Procedure(s) Performed: ESOPHAGOGASTRODUODENOSCOPY (EGD) WITH PROPOFOL     Patient location during evaluation: Endoscopy Anesthesia Type: MAC Level of consciousness: awake Pain management: pain level controlled Vital Signs Assessment: post-procedure vital signs reviewed and stable Respiratory status: spontaneous breathing, nonlabored ventilation and respiratory function stable Cardiovascular status: blood pressure returned to baseline and stable Postop Assessment: no apparent nausea or vomiting Anesthetic complications: no   No notable events documented.  Last Vitals:  Vitals:   03/30/22 1110 03/30/22 1120  BP: (!) 140/53 (!) 158/53  Pulse: (!) 56 (!) 53  Resp: 16 13  Temp:    SpO2: 97% 99%    Last Pain:  Vitals:   03/30/22 1120  TempSrc:   PainSc: 6                  Odies Desa P Alexea Blase

## 2022-03-30 NOTE — Anesthesia Preprocedure Evaluation (Addendum)
Anesthesia Evaluation  Patient identified by [name], date of birth, ID band Patient awake    Reviewed: Allergy & Precautions, NPO status , Patient's Chart, lab work & pertinent test results  Airway Mallampati: II  TM Distance: >3 FB Neck ROM: Full    Dental no notable dental hx.    Pulmonary asthma , sleep apnea and Oxygen sleep apnea    Pulmonary exam normal        Cardiovascular hypertension, + angina  + CAD, + Past MI, + Cardiac Stents, + CABG, + Peripheral Vascular Disease and +CHF  Normal cardiovascular exam+ dysrhythmias   Cardiac MRI: Mildly dilated left ventricle with normal wall thickness. Basal-mid inferior akinesis, LV EF 48%.    Neuro/Psych  Headaches PSYCHIATRIC DISORDERS Anxiety Depression    TIA   GI/Hepatic Neg liver ROS, hiatal hernia,GERD  Medicated and Controlled,,  Endo/Other  diabetes  Morbid obesity  Renal/GU Renal disease     Musculoskeletal  (+) Arthritis ,    Abdominal  (+) + obese  Peds  Hematology negative hematology ROS (+)   Anesthesia Other Findings n+v abd pain  abdnormal CT  GERD  Reproductive/Obstetrics                             Anesthesia Physical Anesthesia Plan  ASA: 4  Anesthesia Plan: MAC   Post-op Pain Management:    Induction: Intravenous  PONV Risk Score and Plan: 2 and Propofol infusion and Treatment may vary due to age or medical condition  Airway Management Planned:   Additional Equipment:   Intra-op Plan:   Post-operative Plan:   Informed Consent: I have reviewed the patients History and Physical, chart, labs and discussed the procedure including the risks, benefits and alternatives for the proposed anesthesia with the patient or authorized representative who has indicated his/her understanding and acceptance.     Dental advisory given  Plan Discussed with: CRNA  Anesthesia Plan Comments:        Anesthesia Quick  Evaluation

## 2022-03-30 NOTE — Discharge Instructions (Signed)
YOU HAD AN ENDOSCOPIC PROCEDURE TODAY: Refer to the procedure report and other information in the discharge instructions given to you for any specific questions about what was found during the examination. If this information does not answer your questions, please call [place] office at [phone] to clarify.  ° °YOU SHOULD EXPECT: Some feelings of bloating in the abdomen. Passage of more gas than usual. Walking can help get rid of the air that was put into your GI tract during the procedure and reduce the bloating. If you had a lower endoscopy (such as a colonoscopy or flexible sigmoidoscopy) you may notice spotting of blood in your stool or on the toilet paper. Some abdominal soreness may be present for a day or two, also. ° °DIET: Your first meal following the procedure should be a light meal and then it is ok to progress to your normal diet. A half-sandwich or bowl of soup is an example of a good first meal. Heavy or fried foods are harder to digest and may make you feel nauseous or bloated. Drink plenty of fluids but you should avoid alcoholic beverages for 24 hours. If you had a esophageal dilation, please see attached instructions for diet.   ° °ACTIVITY: Your care partner should take you home directly after the procedure. You should plan to take it easy, moving slowly for the rest of the day. You can resume normal activity the day after the procedure however YOU SHOULD NOT DRIVE, use power tools, machinery or perform tasks that involve climbing or major physical exertion for 24 hours (because of the sedation medicines used during the test).  ° °SYMPTOMS TO REPORT IMMEDIATELY: °A gastroenterologist can be reached at any hour. Please call [phone]  for any of the following symptoms:  °Following lower endoscopy (colonoscopy, flexible sigmoidoscopy) °Excessive amounts of blood in the stool  °Significant tenderness, worsening of abdominal pains  °Swelling of the abdomen that is new, acute  °Fever of 100° or  higher  °Following upper endoscopy (EGD, EUS, ERCP, esophageal dilation) °Vomiting of blood or coffee ground material  °New, significant abdominal pain  °New, significant chest pain or pain under the shoulder blades  °Painful or persistently difficult swallowing  °New shortness of breath  °Black, tarry-looking or red, bloody stools ° °FOLLOW UP:  °If any biopsies were taken you will be contacted by phone or by letter within the next 1-3 weeks. Call [phone]  if you have not heard about the biopsies in 3 weeks.  °Please also call with any specific questions about appointments or follow up tests. ° °

## 2022-03-30 NOTE — Op Note (Signed)
[facility] Patient Name: [name] [name] Procedure Date: 03/30/2022 [name]: [id number] Attending MD: Docia Chuck. Henrene Pastor , MD, [id number] Date of Birth: [birth year] CSN: [id number] Age: 77 Admit Type: Outpatient Procedure:                Upper GI endoscopy Indications:              Nausea with vomiting. Question of gastric                            abnormality on CT. Status post gastric sleeve                            surgery remotely. On escalating doses of Ozempic                            per protocol Providers:                Docia Chuck. Henrene Pastor, MD, Dulcy Fanny, Frazier Richards,                            Technician Referring MD:             Derinda Late, MD Medicines:                Monitored Anesthesia Care Complications:            No immediate complications. Estimated Blood Loss:     Estimated blood loss: none. Procedure:                Pre-Anesthesia Assessment:                           - Prior to the procedure, a History and Physical                            was performed, and patient medications and                            allergies were reviewed. The patient's tolerance of                            previous anesthesia was also reviewed. The risks                            and benefits of the procedure and the sedation                            options and risks were discussed with the patient.                            All questions were answered, and informed consent                            was obtained. Prior Anticoagulants: The patient has  taken Eliquis and Plavix (clopidogrel), last doses                            were 1 day prior to procedure. ASA Grade                            Assessment: III - A patient with severe systemic                            disease. After reviewing the risks and benefits,                            the patient was deemed in satisfactory condition to                            undergo the  procedure.                           After obtaining informed consent, the endoscope was                            passed under direct vision. Throughout the                            procedure, the patient's blood pressure, pulse, and                            oxygen saturations were monitored continuously. The                            [id number] Olympus endoscope was introduced                            through the mouth, and advanced to the second part                            of duodenum. The upper GI endoscopy was                            accomplished without difficulty. The patient                            tolerated the procedure well. Scope In: Scope Out: Findings:      The esophagus was normal.      The stomach revealed evidence of prior sleeve gastrectomy without       specific mucosal abnormalities.      The examined duodenum was normal.      The cardia and gastric fundus were normal on retroflexion, with evidence       of prior surgery. Impression:               1. Status post sleeve gastrectomy with expected                            postoperative anatomy.  2. Nausea and vomiting. Combination of known GERD,                            altered gastric anatomy, and Ozempic therapy Moderate Sedation:      none Recommendation:           - Patient has a contact number available for                            emergencies. The signs and symptoms of potential                            delayed complications were discussed with the                            patient. Return to normal activities tomorrow.                            Written discharge instructions were provided to the                            patient.                           - Resume previous diet.                           - Continue all present medications.                           - Eating smaller portions may assist with digestion                           -  Return to the care of your primary provider Procedure Code(s):        --- Professional ---                           [phone], Esophagogastroduodenoscopy, flexible,                            transoral; diagnostic, including collection of                            specimen(s) by brushing or washing, when performed                            (separate procedure) Diagnosis Code(s):        --- Professional ---                           R11.2, Nausea with vomiting, unspecified CPT copyright 2022 American Medical Association. All rights reserved. The codes documented in this report are preliminary and upon coder review may  be revised to meet current compliance requirements. Docia Chuck. Henrene Pastor, MD 03/30/2022 11:15:45 AM This report has been signed electronically. Number of Addenda: 0

## 2022-03-30 NOTE — Interval H&P Note (Signed)
History and Physical Interval Note:  03/30/2022 10:12 AM  [name] [name]  has presented today for surgery, with the diagnosis of n+v abd pain abdnormal CT GERD.  The various methods of treatment have been discussed with the patient and family. After consideration of risks, benefits and other options for treatment, the patient has consented to  Procedure(s): ESOPHAGOGASTRODUODENOSCOPY (EGD) WITH PROPOFOL (N/A) as a surgical intervention.  The patient's history has been reviewed, patient examined, no change in status, stable for surgery.  I have reviewed the patient's chart and labs.  Questions were answered to the patient's satisfaction.     Scarlette Shorts

## 2022-03-30 NOTE — Transfer of Care (Signed)
Immediate Anesthesia Transfer of Care Note  Patient: [name] [name]  Procedure(s) Performed: ESOPHAGOGASTRODUODENOSCOPY (EGD) WITH PROPOFOL  Patient Location: PACU and Endoscopy Unit  Anesthesia Type:MAC  Level of Consciousness: drowsy and patient cooperative  Airway & Oxygen Therapy: Patient Spontanous Breathing and Patient connected to face mask oxygen  Post-op Assessment: Report given to RN and Post -op Vital signs reviewed and stable  Post vital signs: Reviewed and stable  Last Vitals:  Vitals Value Taken Time  BP 163/46 03/30/22 1105  Temp 36.4 C 03/30/22 1105  Pulse 57 03/30/22 1105  Resp 18 03/30/22 1105  SpO2 100 % 03/30/22 1105    Last Pain:  Vitals:   03/30/22 1105  TempSrc: Temporal  PainSc: Asleep         Complications: No notable events documented.

## 2022-03-30 NOTE — Anesthesia Procedure Notes (Signed)
Procedure Name: MAC Date/Time: 03/30/2022 10:55 AM  Performed by: Eben Burow, CRNAPre-anesthesia Checklist: Patient identified, Emergency Drugs available, Suction available, Patient being monitored and Timeout performed Oxygen Delivery Method: Simple face mask Airway Equipment and Method: Bite block Placement Confirmation: positive ETCO2 and breath sounds checked- equal and bilateral Dental Injury: Teeth and Oropharynx as per pre-operative assessment

## 2022-03-31 ENCOUNTER — Encounter (HOSPITAL_COMMUNITY): Payer: Self-pay | Admitting: Internal Medicine

## 2022-04-05 ENCOUNTER — Other Ambulatory Visit (HOSPITAL_COMMUNITY): Payer: Self-pay

## 2022-04-05 MED ORDER — APIXABAN 5 MG PO TABS: 5.0000 mg | ORAL_TABLET | Freq: Two times a day (BID) | ORAL | 3 refills | Status: DC

## 2022-04-05 MED ORDER — ENTRESTO 97-103 MG PO TABS: 1.0000 | ORAL_TABLET | Freq: Two times a day (BID) | ORAL | 3 refills | Status: DC

## 2022-04-13 NOTE — Progress Notes (Signed)
Date:  04/14/2022   ID:  TALESA LOPILATO, DOB Nov 09, 1945, [name] [id number]   Provider location: [place] [facility] Type of Visit: Established patient   PCP:  Derinda Late, MD  HF Cardiologist:  Dr. [name]  Chief Complaint: Follow up for CAD and CHF.   History of Present Illness: [name] [name] is a 77 y.o. female who has a history of CAD s/p CABG, diastolic CHF, and cerebrovascular disease with history of CVA presents for followup of CHF and diastolic CHF. She had CABG x 5 in 12/11.  Prior to the CABG she had multiple PCIs.  She had a CVA in 2/14 that presented as visual loss.  She had a left carotid stent in 2/15.    Left heart cath done in Sept. 2014. This showed patent SVG-D, LIMA-LAD, and sequential SVG-OM branches but SVG-RCA and the native RCA were both totally occluded. It was felt aht her increased symptoms coincided with occlusion of SVG-RCA.  She was started on Imdur to see if this would help with the chest pain and dyspnea.  However, she feels like Imdur causes leg cramps and does not think that she can take it.  So ranolazine 500 mg bid started and titrated this up to 1000 mg bid.  This helped some but not markedly. Therefore, sent to  Dr. Irish Lack to address opening RCA CTO. He was able to do this in 5/15 with 4 overlapping Xience DES.  This led to resolution of exertional chest pain.     [name] [name] was started on Brilinta after PCI.  She became much more short of breath after starting on Brilinta. Brilinta stopped and replaced with Plavix.  Dyspnea improved off Brilinta. Given increasing exertional dyspnea and chest pain,  RHC/LHC was done in 4/17.  This showed stable CAD with no interventional target.  Left and right heart filling pressures were not significantly increased.  Medical management.    She had an MRI/MRA in May 2018 that showed moderately severe stenosis of the supraclinoid segment of the right internal carotid artery, progressed since the 2016 MR  angiogram, and severe basilar artery stenosis.    She developed recurrent exertional chest pain and had LHC in 6/18, showing 4/5 grafts patent with patent native RCA (similar to prior cath, no changes).     Echo 1/19 with EF 55-60%, moderate diastolic dysfunction, PASP 50 mmHg, mildly dilated RV, mild AS, mild-moderate MR.    She reported increased dyspnea and chest pain and had RHC/LHC in 9/19.  She had DES to sequential SVG-OM1/PLOM.  While hospitalized, she was noted to have runs of atrial fibrillation.  She was very symptomatic with the atrial fibrillation.  Eliquis and amiodarone were started.    She was admitted 12/16-12/19/19 for cardiomems implant and RHC. She was diuresed with IV lasix and transitioned to torsemide 100 mg BID + metolazone once/week. DC weight: 295 lbs.   She had a prolonged hospitalization in 2/20 with acute respiratory failure and fever to 103.  She was thought to have PNA and treated with cefepime.  She was intubated.  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  Amiodarone was stopped and she was put on prednisone. Echo in 2/20 showed EF down to 25-30% with mid-apical LV severe hypokinesis. She had AKI. Possible ITP triggered by infection, HIT negative.  ITP was treated with Solumedrol. She was discharged to SNF.    ORIF left ankle 4/20.   11/20 admission  initially with concern for TIAs (staring spells), ended up thinking possible partial seizures.  Head MRI did not show CVA.  Echo in 11/20 showed EF 45-50%, moderately decreased RV systolic function, moderate RV enlargement, moderate-severe MR, moderate TR.   Patient was admitted again in 12/20 with AKI and hypotension in the setting of sinus bradycardia (HR 30s).  She was volume overloaded on exam.  Toprol XL was stopped and HR increased to 60s.  She was diuresed with IV Lasix.  TEE in 12/20 showed EF 50-55%, septal-lateral dyssynchrony, mildly decreased RV function, moderate MR.   Atypical  chest pain in 1/21, Cardiolite showed EF 57%, fixed inferior defect (likely artifact), no ischemia.   Echo in 5/22 showed EF 60-65%, moderate LVH, normal RV, PASP 37 mmHg, 2.5 x 2.1 cm calcified mass posterior mitral annulus with mild-moderate MR => ?severe MAC but more circumscribed and prominent than in the past, mild AS mean gradient 12 mmHg and AVA 1.83 cm^2.  Echo in 11/22 showed EF 55%, normal RV, mass posterior mitral annulus, moderate MR, mild AS, IVC normal.  Cardiac MRI was done in 1/23 to investigate posterior mitral annulus mass; study showed LV EF 48%, mild LV dilation, normal RV with EF 53%, extensive MAC is likely the source of the posterior mitral annulus mass, basal-mid inferior >50% subendocardial LGE suggestive of prior inferior MI.   Follow up 2/23 she had chest tightness x 4 weeks. Arranged for [facility] which showed in-stent restenosis of pRCA, felt to be the source of her symptoms given occluded SVG-RCA. Underwent PTCA to pRCA. RHC with normal PCWP and RA pressure, mild pulmonary hypertension, preserved CO. On ASA + Plavix + Eliquis x 1 month (until 05/21/21), then Plavix + Eliquis.   Today she returns for HF follow up. Overall feeling fair. Has been feeling sick on Ozempic but wants to stay on it. She is SOB walking with her rolling walker on flat ground. She feels CR has helped her. Has occasional ankle and abdominal swelling. She is chronically dizzy. She continues with atypical occasional chest pain, similar to chronic pattern. Denies palpitations, abnormal bleeding or PND/Orthopnea. Appetite ok. No fever or chills. Weight at home 218 pounds. Taking all medications. She received her new Cardiomems pillow yesterday.  ECG (personally reviewed): SB, IVCD 172 msec, QTc 448 msec  Labs (1/19): K 4.9, creatinine 1.22 Labs (9/19): LDL 108 Labs (10/19): hgb 14.5, K 4.3, creatinine 1.03 Labs 12/06/2017: K 4.7 Creatinine 1.7 Labs (3/20): K 4.8, creatinine 1.39, hgb 12.4 plts 226 Labs  (11/20): K 4.2, creatinine 1.01, LDL 38 Labs (12/20): K 3.5, creatinine 1.8 Labs (3/21): K 4.5, creatinine 1.5 Labs (2/22): K 4.2, creatinine 1.13 Labs (4/22): LDL 58 Labs (9/22): K 4.3, creatinine 1.27 Labs (12/22): K 4.4, creatinine 1.13 Labs (3/23): K 4.3, creatinine 1.02 Labs (5/23): K 4.3, creatinine 1.33 Labs (6/23): BNP 140, K 4.5, creatinine 0.98 Labs (10/23): K 4.2, creatinine 0.86 Labs (12/23): K 4.4, creatinine 0.90, LDL 111   PMH: 1. Diabetic gastroparesis 2. Type II diabetes 3. HTN 4. Morbid obesity 5. CAD: s/p CABG in 12/11 after prior PCIs.  LIMA-LAD, SVG-D, seq SVG-OM1 and OM2, SVG-PDA. Adenosine Cardiolite (8/14) with EF 53% and a small reversible apical defect with a medium, partially reversible inferior defect.  LHC (9/14) with patent SVG-D, LIMA-LAD (50% distal LAD), and sequential SVG-OM branches; the SVG-RCA and the native RCA were occluded.  This was managed medically initially, but with ongoing exertional chest pain, it was decided to open CTO.  Patient had CTO opening with 4 overlapping Xience DES in the RCA in 5/15.   - LHC (4/17): SVG-D patent, LIMA-LAD patent, distal LAD with several 40-50% stenoses, sequential SVG-OM1 and PLOM patent with 50% proximal stenosis (not flow limiting), SVG-RCA TO with patent RCA stents.  - LHC (6/18): 4/5 grafts patent (SVG-RCA TO, same as past), RCA stents patent => no change.  - LHC (9/19): Sequential SVG-PLOM/OM1 with 80% proximal stenosis, s/p DES.  - Cardiolite (1/21): EF 57%, fixed inferior defect (likely artifact), no ischemia.  - LHC (3/23): severe CAD with 90% in-stent restenosis of pRCA, s/p PTCA to in-stent restenosis of pRCA 6. Atypical migraines 7. OHS/OSA: Intolerant of CPAP. Uses oxygen with exertion and at night.  8. GERD with hiatal hernia 9. OA 10. HFPEF: She has a Cardiomems device.  Echo (11/13) with EF 50-55%, grade II diastolic dysfunction, mild-moderate MR.  Echo (8/14) with EF 55-60%, grade II diastolic  dysfunction, mildly increased aortic valve gradient (mean 12 mmHg) but valve opens well, mild MR and mild RV dilation.  Echo (9/15) with EF 60-65%, grade II diastolic dysfunction, mild aortic stenosis, mild mitral stenosis, mild to moderate mitral regurgitation, mildly dilated RV with normal systolic function, PA systolic pressure 46 mmHg.  - RHC (4/17): mean RA 9, PA 38/15 mean 27, mean PCWP 9, CI 2.5.  - Echo (5/17): EF 55-60%, mild LVH, mildly dilated RV with low normal systolic function, moderate TR, PASP 61 mmHg - Echo (1/19): EF 55-60%, moderate diastolic dysfunction, PASP 50 mmHg, mildly dilated RV, mild AS, mild-moderate MR. - RHC (9/19): mean RA 11, PA 34/12, mean PCWP 15, CI 2.85 - Echo (3/20): EF 25-30% with mid-apical severe hypokinesis.  - Echo (11/20): EF 45-50%, moderately dilated RV with moderately decreased systolic function, moderate-severe MR, moderate TR.  - TEE (12/20): EF 50-55%, septal-lateral dyssynchrony, moderate RV dilation/mildly decreased function, peak RV-RA gradient 51 mmHg, moderate MR.  - Echo (5/22): EF 60-65%, moderate LVH, normal RV, PASP 37 mmHg, 2.5 x 2.1 cm calcified mass posterior mitral annulus with mild-moderate MR => ?severe MAC but more circumscribed and prominent than in the past, mild AS mean gradient 12 mmHg and AVA 1.83 cm^2.  - Echo (11/22): EF 55%, normal RV, mass posterior mitral annulus, moderate MR, mild AS, IVC normal. - Cardiac MRI in 1/23 showed LV EF 48%, mild LV dilation, normal RV with EF 53%, extensive MAC is likely the source of the posterior mitral annulus mass, basal-mid inferior >50% subendocardial LGE suggestive of prior inferior MI.  - RHC (3/23): normal PCWP (14) and RA pressure (2) , mild pulmonary hypertension, preserved CO/CI (6.51/3.28). 11. CKD 12. Chronic LBBB 13. Anxiety 14. Carotid stenosis: Followed by [facility], [id number] LICA stenosis [id number].  She had left carotid stent in 2/15. Carotid dopplers 8/15 with no significant disease. Carotid  dopplers (4/16): < 40% RICA stenosis.  - Carotid dopplers (9/22): 1-39% BICA stenosis.  15. Cerebrovascular disease: CVA 2/14 with right posterior cerebral artery territory ischemic infarction. Cerebral angiogram in 6/14 showed 70% right vertebral ostial stenosis, 123456 LICA stenosis, > [id number] proximal left posterior cerebral artery stenosis, posterior communicating artery aneurysm.  Patient has had episodes of transient expressive aphasia.  Carotid dopplers ([id number]) showed [id number] LICA stenosis. She had a left carotid stent 2/15.  Possible CVA in 7/15. -  MRI/MRA in May 2018 that showed moderately severe stenosis of the supraclinoid segment of the right internal carotid artery, progressed since the 2016 MR angiogram and severe basilar artery stenosis.  16.  Positional vertigo (suspected) 17. Palpitations: Holter (6/15) with rare PVCs/PACs.  - Event monitor (2/18): NSR, occasional PVCs - 14 day Zio (9/23) mostly NSR, rare PVCs and PACs, few short runs of SVT, no atrial fibrillation or worrisome arrhythmias. 18. Dyspnea with Brilinta. 19. Melanoma: On face, s/p excision.   20. Aortic stenosis: Mild on 11/22 echo.  21. Posterior mitral annular mass: Cardiac MRI was done in 1/23 to investigate posterior mitral annulus mass; study showed LV EF 48%, mild LV dilation, normal RV with EF 53%, extensive MAC is likely the source of the posterior mitral annulus mass, basal-mid inferior >50% subendocardial LGE suggestive of prior inferior MI.  22. Lower extremity arterial dopplers (9/16) were normal.  23. Sleep study (4/18): No significant OSA.  24. Atrial fibrillation: Paroxysmal - Amiodarone stopped, ?lung toxicity.  25. H/o ITP in 3/20.  26. Partial seizures 27. Mitral regurgitation: Moderate on TEE in 12/20.  - Mild to moderate on 5/22 echo.  - Moderate on 11/22 echo  Current Outpatient Medications  Medication Sig Dispense Refill   acetaminophen (TYLENOL) 500 MG tablet Take 500 mg by mouth every 6 (six)  hours as needed for mild pain, moderate pain, fever or headache.     albuterol (VENTOLIN HFA) 108 (90 Base) MCG/ACT inhaler Inhale 2 puffs into the lungs every 4 (four) hours as needed for wheezing or shortness of breath.      Alirocumab (PRALUENT) 150 MG/ML SOAJ Inject 150 mg into the skin every 14 (fourteen) days. 6 mL 3   ALPRAZolam (XANAX) 0.5 MG tablet Take 1-2 tablets (0.5-1 mg total) by mouth See admin instructions. Take one tablet (0.5 mg) by mouth every morning and two tablets (1 mg) at night, may also take 1 tablet (0.5 mg) midday as needed for anxiety (Patient taking differently: Take 0.5-1 mg by mouth See admin instructions. Take 2 tablets (1 mg) by mouth every morning and two tablets (1 mg) at night, may also take 1 tablet (0.5 mg) midday as needed for anxiety)     amLODipine (NORVASC) 5 MG tablet Take 1 tablet (5 mg total) by mouth at bedtime. 90 tablet 3   apixaban (ELIQUIS) 5 MG TABS tablet Take 1 tablet (5 mg total) by mouth 2 (two) times daily. 180 tablet 3   B Complex-C (B-COMPLEX WITH VITAMIN C) tablet Take 1 tablet by mouth in the morning.     baclofen (LIORESAL) 10 MG tablet Take 10 mg by mouth at bedtime.     bisacodyl (DULCOLAX) 5 MG EC tablet Take 5 mg by mouth daily as needed for moderate constipation.     buPROPion (WELLBUTRIN XL) 300 MG 24 hr tablet Take 300 mg by mouth daily.     carboxymethylcellulose (REFRESH PLUS) 0.5 % SOLN Place 1 drop into both eyes in the morning, at noon, and at bedtime.     Cholecalciferol (VITAMIN D-3) 25 MCG (1000 UT) CAPS Take 2,000 Units by mouth daily in the afternoon.     clopidogrel (PLAVIX) 75 MG tablet Take 75 mg by mouth every morning.     denosumab (PROLIA) 60 MG/ML SOSY injection Inject 60 mg into the skin every 6 (six) months.     DULoxetine (CYMBALTA) 30 MG capsule Take 1 capsule (30 mg total) by mouth daily. 30 capsule 0   ezetimibe (ZETIA) 10 MG tablet Take 1 tablet (10 mg total) by mouth at bedtime. 90 tablet 3   fluocinonide  cream (LIDEX) [id number] % Apply 1 application. topically 2 (two) times daily  as needed (irritation).     Fluticasone-Salmeterol (ADVAIR) 250-50 MCG/DOSE AEPB Inhale 1 puff into the lungs daily.     gabapentin (NEURONTIN) 600 MG tablet Take 600-1,200 mg by mouth See admin instructions. Take 1 tablet (600 mg) by mouth in the morning & take 2 tablets (1200 mg) by mouth at bedtime     Insulin Glargine (LANTUS SOLOSTAR) 100 UNIT/ML Solostar Pen Inject 18 Units into the skin at bedtime. Per sliding scale     insulin lispro (HUMALOG) 100 UNIT/ML KwikPen Inject 8 Units into the skin See admin instructions. Inject 8 units subcutaneously prior to breakfast and supper; add 4 units for CBG >200     loperamide (IMODIUM A-D) 2 MG tablet Take 2 mg by mouth 4 (four) times daily as needed for diarrhea or loose stools.     Multiple Vitamin (MULTIVITAMIN WITH MINERALS) TABS tablet Take 1 tablet by mouth every morning. Centrum - Women over 50     Multiple Vitamins-Minerals (OCUVITE EYE HEALTH FORMULA) CAPS Take 1 capsule by mouth every morning.     nitroGLYCERIN (NITROSTAT) 0.3 MG SL tablet Place 1 tablet (0.3 mg total) under the tongue every 5 (five) minutes x 3 doses as needed for chest pain. 25 tablet 1   ondansetron (ZOFRAN-ODT) 4 MG disintegrating tablet Take 4 mg by mouth 2 (two) times daily.     OXYGEN Inhale 2 L into the lungs at bedtime as needed (shortness of breath).      pantoprazole (PROTONIX) 40 MG tablet Take 1 tablet (40 mg total) by mouth daily. 30 tablet 0   pyridOXINE (VITAMIN B6) 100 MG tablet Take 100 mg by mouth every morning.     ranolazine (RANEXA) 1000 MG SR tablet TAKE ONE TABLET BY MOUTH TWICE DAILY 180 tablet 3   rosuvastatin (CRESTOR) 40 MG tablet Take 1 tablet (40 mg total) by mouth daily. 90 tablet 3   sacubitril-valsartan (ENTRESTO) 97-103 MG Take 1 tablet by mouth 2 (two) times daily. 180 tablet 3   Semaglutide, 2 MG/DOSE, (OZEMPIC, 2 MG/DOSE,) 8 MG/3ML SOPN Inject 2 mg into the skin once a  week. (Patient taking differently: Inject 2 mg into the skin every Thursday.) 3 mL 11   spironolactone (ALDACTONE) 25 MG tablet TAKE 1 TABLET BY MOUTH DAILY 90 tablet 3   torsemide (DEMADEX) 10 MG tablet TAKE ONE (1) TABLET BY MOUTH EVERY DAY 90 tablet 3   traZODone (DESYREL) 50 MG tablet Take 1 tablet (50 mg total) by mouth at bedtime. (Patient taking differently: Take 100 mg by mouth at bedtime.) 90 tablet 1   vitamin B-12 (CYANOCOBALAMIN) 1000 MCG tablet Take 1,000 mcg by mouth every morning.      zolpidem (AMBIEN) 10 MG tablet Take 10 mg by mouth at bedtime.     No current facility-administered medications for this encounter.    Allergies:   Amoxicillin, Brilinta [ticagrelor], Erythromycin, Flagyl [metronidazole], Penicillins, Isosorbide mononitrate [isosorbide nitrate], Jardiance [empagliflozin], Metformin and related, and Tape   Social History:  The patient  reports that she has never smoked. She has never used smokeless tobacco. She reports that she does not drink alcohol and does not use drugs.   Family History:  The patient's family history includes AAA (abdominal aortic aneurysm) in her father and mother; Cancer in her sister; Deep vein thrombosis in her father and mother; Dementia in her maternal grandmother and mother; Diabetes in her father, paternal aunt, paternal grandmother, paternal uncle, and paternal uncle; Heart attack in her father; Heart attack (  age of onset: 8) in her paternal grandfather; Heart disease in her father and paternal uncle; Hyperlipidemia in her father; Hypertension in her father, mother, and sister; Stroke in her maternal grandmother, paternal grandmother, and paternal uncle.   ROS:  Please see the history of present illness.   All other systems are personally reviewed and negative.   Recent Labs: 09/15/2021: B Natriuretic Peptide 195.9 01/13/2022: BUN 12; Creatinine, Ser 0.90; Hemoglobin 15.2; Platelets 163; Potassium 4.4; Sodium 140  Personally reviewed    Wt Readings from Last 3 Encounters:  04/14/22 99.9 kg (220 lb 3.2 oz)  03/30/22 99.8 kg (220 lb)  03/08/22 98.2 kg (216 lb 9.6 oz)    BP (!) 148/66   Pulse (!) 55   Wt 99.9 kg (220 lb 3.2 oz)   SpO2 99%   BMI 41.61 kg/m   Physical Exam:   General:  NAD. No resp difficulty, walked into clinic with RW HEENT: Normal Neck: Supple. No JVD. Carotids 2+ bilat; no bruits. No lymphadenopathy or thryomegaly appreciated. Cor: PMI nondisplaced. Brady rate & rhythm. No rubs, gallops or murmurs. Lungs: Clear Abdomen: Soft, nontender, nondistended. No hepatosplenomegaly. No bruits or masses. Good bowel sounds. Extremities: No cyanosis, clubbing, rash, edema Neuro: Alert & oriented x 3, cranial nerves grossly intact. Moves all 4 extremities w/o difficulty. Affect pleasant.  Assessment & Plan: 1. CAD: Occluded native RCA and SVG-RCA.  Status post opening of CTO RCA with 4 overlapping Xience DES in 5/15.  DES to sequential SVG-OM1/PLOM in 9/19. LHC in 3/23 showed severe native coronary disease with 90% in-stent restenosis in the proximal RCA (patient has 2 layers of stent at this site). The sequential SVG-OM1/PLOM was patent, SVG-D was patent, and LIMA-LAD was patent. She underwent PTCA of pRCA. She continues to have occasional atypical chest pain but not her typical ischemic pain.  - Continue apixaban and Plavix (has been on chronic Plavix, neuro has wanted her to stay on this for cerebrovascular disease).   - Off Imdur with headache. - Off Toprol XL with bradycardia.   - Continue ranolazine 1000 mg bid.   2. Chronic systolic => diastolic CHF: Echo in [id number] with EF 55-60%, moderate diastolic dysfunction. Cardiomems placed 01/16/18.  Echo in 3/20 in setting of severe PNA with intubation showed EF 25-30%, mid-apical LV severe hypokinesis.  Possible stress (Takotsubo-type) cardiomyopathy related to severe medical illness versus progression of CAD.  She has baseline chronic LBBB which could play a role in  cardiomyopathy as well (LBBB cardiomyopathy).  Repeat echo in 11/20 with EF up to 45-50%, moderately dysfunctional RV.  TEE in 12/20 with EF 50-55% with septal-lateral dyssynchrony and mildly dysfunctional RV.  Most recent echo in 11/22 with EF 55% with normal RV.  cMRI in 1/23 with EF 48% and evidence for prior inferior MI.  Ongoing NYHA class III symptoms, she did not improve post-PTCA RCA.   She does not appear volume overloaded on exam.  She has a new Cardiomems pillow, will see if we can receive readings. - Continue spiro 25 mg daily. BMET/BNP today - Continue torsemide 10 mg daily. - Continue Entresto 97/103 mg bid.  - Cannot tolerate SGLT2i. - Not on beta blocker due to bradycardia.  3. Hyperlipidemia: She remains on Zetia, Crestor and Praluent.  - Followed in lipid clinic.  4. Hypertension: BP controlled. She has had orthostatic symptoms and has fallen in the past.  - Continue amlodipine 5 mg daily (recently reduced).  5. Cerebrovascular disease: She has history of CVA  and had left carotid stent in 2/15. Followed by [facility].  Last MRA head showed severe stenosis of supraclinoid RICA and severe basilar artery stenosis.  - Neurology would like her to continue Plavix for this despite concomitant apixaban use.  6. OHS/OSA: Cannot tolerate CPAP.  Uses oxygen at night.  7. Atrial fibrillation: Paroxysmal. 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 No recent palpitations, SB today.  - Continue apixaban 5 mg bid. No bleeding issues. 8. Obesity: Body mass index is 41.61 kg/m. - She is now on Ozempic, weight coming down. 9. Mitral regurgitation: Moderate on 11/22 echo.  10. Bradycardia: Now off Toprol XL.   11. Aortic stenosis: Mild on 11/22 echo.  12. Mitral annular mass: This is a well-circumscribed calcified posterior mitral annulus mass noted on 5/22 and 11/22 echoes.  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  Follow up in 3-4 months with Dr.  [name]  Signed, Rafael Bihari, [place]  04/14/2022  [place] [address] [facility] [place] [address] [phone] (office) [phone] (fax)

## 2022-04-14 ENCOUNTER — Ambulatory Visit (HOSPITAL_COMMUNITY)
Admission: RE | Admit: 2022-04-14 | Discharge: 2022-04-14 | Disposition: A | Discharge status: Home / Self Care | Payer: Medicare Other | Source: Ambulatory Visit | Attending: Cardiology | Admitting: Cardiology

## 2022-04-14 ENCOUNTER — Encounter (HOSPITAL_COMMUNITY): Payer: Self-pay

## 2022-04-14 VITALS — BP 148/66 | HR 55 | Wt 220.2 lb

## 2022-04-14 DIAGNOSIS — I25119 Atherosclerotic heart disease of native coronary artery with unspecified angina pectoris: Secondary | ICD-10-CM

## 2022-04-14 DIAGNOSIS — E1122 Type 2 diabetes mellitus with diabetic chronic kidney disease: Secondary | ICD-10-CM | POA: Diagnosis not present

## 2022-04-14 DIAGNOSIS — I480 Paroxysmal atrial fibrillation: Secondary | ICD-10-CM | POA: Insufficient documentation

## 2022-04-14 DIAGNOSIS — Z955 Presence of coronary angioplasty implant and graft: Secondary | ICD-10-CM | POA: Insufficient documentation

## 2022-04-14 DIAGNOSIS — Z7901 Long term (current) use of anticoagulants: Secondary | ICD-10-CM | POA: Insufficient documentation

## 2022-04-14 DIAGNOSIS — I5022 Chronic systolic (congestive) heart failure: Secondary | ICD-10-CM | POA: Diagnosis not present

## 2022-04-14 DIAGNOSIS — Z9889 Other specified postprocedural states: Secondary | ICD-10-CM | POA: Diagnosis not present

## 2022-04-14 DIAGNOSIS — Z79899 Other long term (current) drug therapy: Secondary | ICD-10-CM | POA: Diagnosis not present

## 2022-04-14 DIAGNOSIS — R42 Dizziness and giddiness: Secondary | ICD-10-CM | POA: Insufficient documentation

## 2022-04-14 DIAGNOSIS — I272 Pulmonary hypertension, unspecified: Secondary | ICD-10-CM | POA: Insufficient documentation

## 2022-04-14 DIAGNOSIS — R0602 Shortness of breath: Secondary | ICD-10-CM | POA: Insufficient documentation

## 2022-04-14 DIAGNOSIS — Z6841 Body Mass Index (BMI) 40.0 and over, adult: Secondary | ICD-10-CM | POA: Insufficient documentation

## 2022-04-14 DIAGNOSIS — R0789 Other chest pain: Secondary | ICD-10-CM | POA: Diagnosis not present

## 2022-04-14 DIAGNOSIS — Z794 Long term (current) use of insulin: Secondary | ICD-10-CM | POA: Diagnosis not present

## 2022-04-14 DIAGNOSIS — R1900 Intra-abdominal and pelvic swelling, mass and lump, unspecified site: Secondary | ICD-10-CM | POA: Diagnosis not present

## 2022-04-14 DIAGNOSIS — E785 Hyperlipidemia, unspecified: Secondary | ICD-10-CM | POA: Insufficient documentation

## 2022-04-14 DIAGNOSIS — I350 Nonrheumatic aortic (valve) stenosis: Secondary | ICD-10-CM

## 2022-04-14 DIAGNOSIS — I252 Old myocardial infarction: Secondary | ICD-10-CM | POA: Insufficient documentation

## 2022-04-14 DIAGNOSIS — E782 Mixed hyperlipidemia: Secondary | ICD-10-CM

## 2022-04-14 DIAGNOSIS — G4733 Obstructive sleep apnea (adult) (pediatric): Secondary | ICD-10-CM

## 2022-04-14 DIAGNOSIS — I25118 Atherosclerotic heart disease of native coronary artery with other forms of angina pectoris: Secondary | ICD-10-CM

## 2022-04-14 DIAGNOSIS — I130 Hypertensive heart and chronic kidney disease with heart failure and stage 1 through stage 4 chronic kidney disease, or unspecified chronic kidney disease: Secondary | ICD-10-CM | POA: Insufficient documentation

## 2022-04-14 DIAGNOSIS — I447 Left bundle-branch block, unspecified: Secondary | ICD-10-CM | POA: Insufficient documentation

## 2022-04-14 DIAGNOSIS — I10 Essential (primary) hypertension: Secondary | ICD-10-CM

## 2022-04-14 DIAGNOSIS — I080 Rheumatic disorders of both mitral and aortic valves: Secondary | ICD-10-CM | POA: Diagnosis not present

## 2022-04-14 DIAGNOSIS — Z951 Presence of aortocoronary bypass graft: Secondary | ICD-10-CM | POA: Insufficient documentation

## 2022-04-14 DIAGNOSIS — N189 Chronic kidney disease, unspecified: Secondary | ICD-10-CM | POA: Diagnosis not present

## 2022-04-14 DIAGNOSIS — Z7902 Long term (current) use of antithrombotics/antiplatelets: Secondary | ICD-10-CM | POA: Diagnosis not present

## 2022-04-14 DIAGNOSIS — Z8673 Personal history of transient ischemic attack (TIA), and cerebral infarction without residual deficits: Secondary | ICD-10-CM

## 2022-04-14 DIAGNOSIS — R001 Bradycardia, unspecified: Secondary | ICD-10-CM

## 2022-04-14 DIAGNOSIS — I340 Nonrheumatic mitral (valve) insufficiency: Secondary | ICD-10-CM

## 2022-04-14 DIAGNOSIS — Z9981 Dependence on supplemental oxygen: Secondary | ICD-10-CM | POA: Insufficient documentation

## 2022-04-14 LAB — BASIC METABOLIC PANEL
Anion gap: 9 (ref 5–15)
BUN: 18 mg/dL (ref 8–23)
CO2: 26 mmol/L (ref 22–32)
Calcium: 9.4 mg/dL (ref 8.9–10.3)
Chloride: 105 mmol/L (ref 98–111)
Creatinine, Ser: 0.9 mg/dL (ref 0.44–1.00)
GFR, Estimated: >60 mL/min (ref >60)
Glucose, Bld: 121 mg/dL — ABNORMAL HIGH (ref 70–99)
Potassium: 4.7 mmol/L (ref 3.5–5.1)
Sodium: 140 mmol/L (ref 135–145)

## 2022-04-14 LAB — BRAIN NATRIURETIC PEPTIDE: B Natriuretic Peptide: 162.8 pg/mL — ABNORMAL HIGH (ref 0.0–100.0)

## 2022-04-14 NOTE — Patient Instructions (Addendum)
It was great to see you today! No medication changes are needed at this time.   Labs today We will only contact you if something comes back abnormal or we need to make some changes. Otherwise no news is good news!  Your physician wants you to follow-up in: 3-4 months with Dr McLean You will receive a reminder letter in the mail two months in advance. If you don't receive a letter, please call our office to schedule the follow-up appointment.  Do the following things EVERYDAY: Weigh yourself in the morning before breakfast. Write it down and keep it in a log. Take your medicines as prescribed Eat low salt foods--Limit salt (sodium) to 2000 mg per day.  Stay as active as you can everyday Limit all fluids for the day to less than 2 liters   At the [facility], you and your health needs are our priority. As part of our continuing mission to provide you with exceptional heart care, we have created designated Provider Care Teams. These Care Teams include your primary Cardiologist (physician) and Advanced Practice Providers (APPs- Physician Assistants and Nurse Practitioners) who all work together to provide you with the care you need, when you need it.   You may see any of the following providers on your designated Care Team at your next follow up: Dr Daniel Bensimhon Dr Dalton McLean Dr. Aditya Sabharwal Amy Clegg, NP Brittainy Simmons, PA Jessica Milford,NP Lindsay Finch, PA Alma Diaz, NP Lauren Kemp, PharmD   Please be sure to bring in all your medications bottles to every appointment.    Thank you for choosing [facility]   If you have any questions or concerns before your next appointment please send us a message through mychart or call our office at [phone].    TO LEAVE A MESSAGE FOR THE NURSE SELECT OPTION 2, PLEASE LEAVE A MESSAGE INCLUDING: YOUR NAME DATE OF BIRTH CALL BACK NUMBER REASON FOR CALL**this is  important as we prioritize the call backs  YOU WILL RECEIVE A CALL BACK THE SAME DAY AS LONG AS YOU CALL BEFORE 4:00 PM  

## 2022-04-15 ENCOUNTER — Other Ambulatory Visit (HOSPITAL_COMMUNITY): Payer: Self-pay

## 2022-04-15 ENCOUNTER — Ambulatory Visit: Payer: Medicare Other | Attending: Internal Medicine | Admitting: Student

## 2022-04-15 ENCOUNTER — Encounter: Payer: Self-pay | Admitting: Student

## 2022-04-15 DIAGNOSIS — E7849 Other hyperlipidemia: Secondary | ICD-10-CM

## 2022-04-15 NOTE — Patient Instructions (Signed)
Your Results:             Your most recent labs Goal  Total Cholesterol 177 < 200  Triglycerides 107 < 150  HDL (happy/good cholesterol) 45 > 40  LDL (lousy/bad cholesterol 111 < 55   Medication changes: We will start the process to get Inclisiran covered by your insurance.  Once the prior authorization is complete, we will call you to let you know and confirm pharmacy information.        Lab orders: We want to repeat labs after 2-3 months.  We will send you a lab order to remind you once we get closer to that time.

## 2022-04-15 NOTE — Assessment & Plan Note (Signed)
Assessment:  LDL goal: <55 mg/dl last LDLc 111 mg/dl (01/13/2022) Tolerates Zetia, high intensity statins and PCSK9i  well without any side effects  Non adherence is suspected last fill for Crestor & Praluent on 11/24/2021 3 months supply and 1 month supply respectively  Discussed next potential option- inclisiran; cost, dosing efficacy, side effects  Patient is in agreement to switch Praluent to [place]: Continue taking current medications Crestor 40 mg daily, ezetimibe 10 mg daily, Praluent 150 mg [place] every 2 weeks Will start form for Leqvio to determine coverage  If Leqvio fully covered will switch Praluent to [facility]

## 2022-04-15 NOTE — Progress Notes (Signed)
Patient ID: [name] [name]                 DOB: 04-22-1945                    [name]: [id number]      HPI: [name] [name] is a 77 y.o. female patient referred to lipid clinic by Dr.McLean PMH is significant for HTN, TIA, CHF, CAD, CVA,OSA, T2DM, diabetic peripheral neuropathy CKD stage III  Patient presented today for lipid medication optimization. Patient states she tolerates her current mediations well and takes them regularly. Patient thinks that after while mediations stop working for her and that is why her LDLc is high again. No adherence is suspected We verified with pharmacy the last fill of Praluent was on 11/24/2021 for 30 days supply and rosuvastatin 40 mg for 90 days. She eats well and does chair exercise regularly.   Reviewed options for lowering LDL cholesterol, including ezetimibe, PCSK-9 inhibitors, bempedoic acid and inclisiran.  Discussed mechanisms of action, dosing, side effects and potential decreases in LDL cholesterol.  Also reviewed cost information and potential options for patient assistance.      Diet: eats 2 meals per day  Breakfast: Oat meal  toast with peanut butter, and banana, once in while eggs  Dinner: vegetables, green beans, meat - chicken or fish 3 times per week chicken breast (organic)   Exercise: on stationary pedals-1 mile every day and weight resistant for upper body once day for 10 min   Social History:  Alcohol: none  Smoking: never   Labs: Lipid Panel     Component Value Date/Time   CHOL 177 01/13/2022 1213   CHOL 164 11/13/2014 1151   TRIG 107 01/13/2022 1213   HDL 45 01/13/2022 1213   HDL 56 11/13/2014 1151   CHOLHDL 3.9 01/13/2022 1213   VLDL 21 01/13/2022 1213   LDLCALC 111 (H) 01/13/2022 1213   [place] 75 11/13/2014 1151   LDLDIRECT 156.7 02/06/2014 1014   LABVLDL 33 11/13/2014 1151    Past Medical History:  Diagnosis Date   Anemia    hx   Anxiety    Asthma    Basal cell carcinoma 05/2014   "left shoulder"   Bundle branch  block, left    chronic/notes 07/18/2013   CHF (congestive heart failure) (HCC)    Chronic insomnia 05/06/2015   Chronic kidney disease    frequency, sees dr Jamal Maes every 4 to 6 months (01/16/2018)   Chronic lower back pain    Claustrophobia    Common migraine 05/14/2014   Coronary artery disease    MI in 2001, 2002, 2006, 2011, 2014   Depression    Diabetic peripheral neuropathy ([place]) 01/12/2019   GERD (gastroesophageal reflux disease)    H/O hiatal hernia    Headache    "at least 2/month" (01/16/2018)   Heart murmur    Hyperlipidemia    Hypertension    Memory change 05/14/2014   Migraine    "5-6/year"  (01/16/2018)   Obesity 01-2010   Obstructive sleep apnea    "can't wear machine; I have claustrophobia" (01/16/2018), states she had a 2nd sleep study and does not have sleep apnea, her O2 decreases and now is on 2 L of O2 at night.   On home oxygen therapy    "2L at night and prn during daytime" (01/16/2018)   Osteoarthritis    "knees and hands" (01/16/2018)   Peripheral vascular disease ([place])    ?  numbness, tingling arms and legs   PONV (postoperative nausea and vomiting)    Stroke ([place])    2014, 2015, 2016   Type II diabetes mellitus ([place])    Ventral hernia    hx of    Current Outpatient Medications on File Prior to Visit  Medication Sig Dispense Refill   acetaminophen (TYLENOL) 500 MG tablet Take 500 mg by mouth every 6 (six) hours as needed for mild pain, moderate pain, fever or headache.     albuterol (VENTOLIN HFA) 108 (90 Base) MCG/ACT inhaler Inhale 2 puffs into the lungs every 4 (four) hours as needed for wheezing or shortness of breath.      Alirocumab (PRALUENT) 150 MG/ML SOAJ Inject 150 mg into the skin every 14 (fourteen) days. 6 mL 3   ALPRAZolam (XANAX) 0.5 MG tablet Take 1-2 tablets (0.5-1 mg total) by mouth See admin instructions. Take one tablet (0.5 mg) by mouth every morning and two tablets (1 mg) at night, may also take 1 tablet (0.5 mg) midday as  needed for anxiety (Patient taking differently: Take 0.5-1 mg by mouth See admin instructions. Take 2 tablets (1 mg) by mouth every morning and two tablets (1 mg) at night, may also take 1 tablet (0.5 mg) midday as needed for anxiety)     amLODipine (NORVASC) 5 MG tablet Take 1 tablet (5 mg total) by mouth at bedtime. 90 tablet 3   apixaban (ELIQUIS) 5 MG TABS tablet Take 1 tablet (5 mg total) by mouth 2 (two) times daily. 180 tablet 3   B Complex-C (B-COMPLEX WITH VITAMIN C) tablet Take 1 tablet by mouth in the morning.     baclofen (LIORESAL) 10 MG tablet Take 10 mg by mouth at bedtime.     bisacodyl (DULCOLAX) 5 MG EC tablet Take 5 mg by mouth daily as needed for moderate constipation.     buPROPion (WELLBUTRIN XL) 300 MG 24 hr tablet Take 300 mg by mouth daily.     carboxymethylcellulose (REFRESH PLUS) 0.5 % SOLN Place 1 drop into both eyes in the morning, at noon, and at bedtime.     Cholecalciferol (VITAMIN D-3) 25 MCG (1000 UT) CAPS Take 2,000 Units by mouth daily in the afternoon.     clopidogrel (PLAVIX) 75 MG tablet Take 75 mg by mouth every morning.     denosumab (PROLIA) 60 MG/ML SOSY injection Inject 60 mg into the skin every 6 (six) months.     DULoxetine (CYMBALTA) 30 MG capsule Take 1 capsule (30 mg total) by mouth daily. 30 capsule 0   ezetimibe (ZETIA) 10 MG tablet Take 1 tablet (10 mg total) by mouth at bedtime. 90 tablet 3   fluocinonide cream (LIDEX) [id number] % Apply 1 application. topically 2 (two) times daily as needed (irritation).     Fluticasone-Salmeterol (ADVAIR) 250-50 MCG/DOSE AEPB Inhale 1 puff into the lungs daily.     gabapentin (NEURONTIN) 600 MG tablet Take 600-1,200 mg by mouth See admin instructions. Take 1 tablet (600 mg) by mouth in the morning & take 2 tablets (1200 mg) by mouth at bedtime     Insulin Glargine (LANTUS SOLOSTAR) 100 UNIT/ML Solostar Pen Inject 18 Units into the skin at bedtime. Per sliding scale     insulin lispro (HUMALOG) 100 UNIT/ML KwikPen  Inject 8 Units into the skin See admin instructions. Inject 8 units subcutaneously prior to breakfast and supper; add 4 units for CBG >200     loperamide (IMODIUM A-D) 2 MG tablet Take  2 mg by mouth 4 (four) times daily as needed for diarrhea or loose stools.     Multiple Vitamin (MULTIVITAMIN WITH MINERALS) TABS tablet Take 1 tablet by mouth every morning. Centrum - Women over 50     Multiple Vitamins-Minerals (OCUVITE EYE HEALTH FORMULA) CAPS Take 1 capsule by mouth every morning.     nitroGLYCERIN (NITROSTAT) 0.3 MG SL tablet Place 1 tablet (0.3 mg total) under the tongue every 5 (five) minutes x 3 doses as needed for chest pain. 25 tablet 1   ondansetron (ZOFRAN-ODT) 4 MG disintegrating tablet Take 4 mg by mouth 2 (two) times daily.     OXYGEN Inhale 2 L into the lungs at bedtime as needed (shortness of breath).      pantoprazole (PROTONIX) 40 MG tablet Take 1 tablet (40 mg total) by mouth daily. 30 tablet 0   pyridOXINE (VITAMIN B6) 100 MG tablet Take 100 mg by mouth every morning.     ranolazine (RANEXA) 1000 MG SR tablet TAKE ONE TABLET BY MOUTH TWICE DAILY 180 tablet 3   rosuvastatin (CRESTOR) 40 MG tablet Take 1 tablet (40 mg total) by mouth daily. 90 tablet 3   sacubitril-valsartan (ENTRESTO) 97-103 MG Take 1 tablet by mouth 2 (two) times daily. 180 tablet 3   Semaglutide, 2 MG/DOSE, (OZEMPIC, 2 MG/DOSE,) 8 MG/3ML SOPN Inject 2 mg into the skin once a week. (Patient taking differently: Inject 2 mg into the skin every Thursday.) 3 mL 11   spironolactone (ALDACTONE) 25 MG tablet TAKE 1 TABLET BY MOUTH DAILY 90 tablet 3   torsemide (DEMADEX) 10 MG tablet TAKE ONE (1) TABLET BY MOUTH EVERY DAY 90 tablet 3   traZODone (DESYREL) 50 MG tablet Take 1 tablet (50 mg total) by mouth at bedtime. (Patient taking differently: Take 100 mg by mouth at bedtime.) 90 tablet 1   vitamin B-12 (CYANOCOBALAMIN) 1000 MCG tablet Take 1,000 mcg by mouth every morning.      zolpidem (AMBIEN) 10 MG tablet Take 10 mg  by mouth at bedtime.     No current facility-administered medications on file prior to visit.    Allergies  Allergen Reactions   Amoxicillin Shortness Of Breath and Rash   Brilinta [Ticagrelor] Shortness Of Breath   Erythromycin Shortness Of Breath, Other (See Comments) and Hives    Trouble swallowing   Flagyl [Metronidazole] Shortness Of Breath and Palpitations   Penicillins Hives, Shortness Of Breath, Rash and Other (See Comments)    Has patient had a PCN reaction causing immediate rash, facial/tongue/throat swelling, SOB or lightheadedness with hypotension: Yes Has patient had a PCN reaction causing severe rash involving mucus membranes or skin necrosis: No Has patient had a PCN reaction that required hospitalization: Yes Has patient had a PCN reaction occurring within the last 10 years: No If all of the above answers are "NO", then may proceed with Cephalosporin use.    Isosorbide Mononitrate [Isosorbide Nitrate] Other (See Comments)    Joint aches, muscles hurt, difficult to walk    Jardiance [Empagliflozin] Other (See Comments)    Nausea, joint aches, muscles aches   Metformin And Related Other (See Comments)    Stomach pain, cold sweats, joint pain, burred vision, dizziness   Tape Other (See Comments)    Skin pulls off with certain types Plastic tape causes skin to rip if left on for long periods of time    Assessment/Plan:  1. Hyperlipidemia -  Problem  Hyperlipemia   Current Medications: Crestor 40 mg daily,  ezetimibe 10 mg daily, Praluent 150 mg every 2 weeks  Intolerances: Lipitor 80 mg, Questran 2 gm twice daily  Risk Factors: HTN, TIA, CHF, CAD, CVA,OSA, T2DM, diabetic peripheral neuropathy CKD stage III LDL goal: <55 mg/dl     Hyperlipemia Assessment:  LDL goal: <55 mg/dl last LDLc 111 mg/dl (01/13/2022) Tolerates Zetia, high intensity statins and PCSK9i  well without any side effects  Non adherence is suspected last fill for Crestor & Praluent on  11/24/2021 3 months supply and 1 month supply respectively  Discussed next potential option- inclisiran; cost, dosing efficacy, side effects  Patient is in agreement to switch Praluent to [place]: Continue taking current medications Crestor 40 mg daily, ezetimibe 10 mg daily, Praluent 150 mg [place] every 2 weeks Will start form for Leqvio to determine coverage  If Leqvio fully covered will switch Praluent to [employer],  Cammy Copa, Pharm.D  [facility] [address], [place], [place] [address]  Phone: [phone]; Fax: [phone]

## 2022-04-30 ENCOUNTER — Other Ambulatory Visit (HOSPITAL_COMMUNITY): Payer: Self-pay | Admitting: *Deleted

## 2022-05-05 ENCOUNTER — Telehealth: Payer: Self-pay | Admitting: Pharmacist

## 2022-05-05 NOTE — Telephone Encounter (Signed)
  patient preferred date for infusion was April 10 after 12:00, infusion center does not have availability to the requested time. suggests patient to call [facility] infusion center [phone].

## 2022-05-05 NOTE — Addendum Note (Signed)
Addended by: Anda Latina on: 05/05/2022 11:08 AM   Modules accepted: Orders

## 2022-05-05 NOTE — Telephone Encounter (Signed)
Patient was contacted by [facility] infusion center. Appointment for 1st infusion was booked on April 11 at 1:00 pm.

## 2022-05-13 ENCOUNTER — Ambulatory Visit (HOSPITAL_COMMUNITY)
Admission: RE | Admit: 2022-05-13 | Discharge: 2022-05-13 | Disposition: A | Discharge status: Home / Self Care | Payer: Medicare Other | Source: Ambulatory Visit | Attending: Cardiology | Admitting: Cardiology

## 2022-05-13 DIAGNOSIS — E7849 Other hyperlipidemia: Secondary | ICD-10-CM | POA: Diagnosis present

## 2022-05-13 MED ORDER — INCLISIRAN SODIUM 284 MG/1.5ML ~~LOC~~ SOSY: 284.0000 mg | PREFILLED_SYRINGE | Freq: Once | SUBCUTANEOUS | Status: AC

## 2022-05-13 MED ORDER — INCLISIRAN SODIUM 284 MG/1.5ML ~~LOC~~ SOSY
PREFILLED_SYRINGE | SUBCUTANEOUS | Status: AC
  Administered 2022-05-13: 284 mg via SUBCUTANEOUS
  Filled 2022-05-13: qty 1.5

## 2022-05-24 ENCOUNTER — Telehealth: Payer: Self-pay | Admitting: Pharmacist

## 2022-05-24 NOTE — Telephone Encounter (Signed)
Patient called and LVM for Megan to discuss with her Ozempic

## 2022-05-24 NOTE — Telephone Encounter (Signed)
Called pt back, feels like most recent Ozempic pen isn't working. Previously experienced weight loss, glucose 120 range, nausea, upset stomach, diarrhea, now has no GI upset, seeing some weight regain, glucose in the 170s despite no dietary changes. Discussed GI issues tend to get better the longer she's on GLP but don't have a great explanation for her other changes. She is due for refilled rx this week and will continue to keep a close eye on her sugar.

## 2022-05-26 ENCOUNTER — Other Ambulatory Visit (HOSPITAL_COMMUNITY): Payer: Self-pay | Admitting: Family Medicine

## 2022-08-09 ENCOUNTER — Other Ambulatory Visit (HOSPITAL_COMMUNITY): Payer: Self-pay | Admitting: *Deleted

## 2022-08-12 ENCOUNTER — Ambulatory Visit (HOSPITAL_COMMUNITY)
Admission: RE | Admit: 2022-08-12 | Discharge: 2022-08-12 | Disposition: A | Discharge status: Home / Self Care | Payer: Medicare Other | Source: Ambulatory Visit | Attending: Cardiology | Admitting: Cardiology

## 2022-08-12 DIAGNOSIS — E785 Hyperlipidemia, unspecified: Secondary | ICD-10-CM | POA: Diagnosis present

## 2022-08-12 MED ORDER — INCLISIRAN SODIUM 284 MG/1.5ML ~~LOC~~ SOSY
PREFILLED_SYRINGE | SUBCUTANEOUS | Status: AC
  Filled 2022-08-12: qty 1.5

## 2022-08-12 MED ORDER — INCLISIRAN SODIUM 284 MG/1.5ML ~~LOC~~ SOSY
284.0000 mg | PREFILLED_SYRINGE | Freq: Once | SUBCUTANEOUS | Status: AC
  Administered 2022-08-12: 284 mg via SUBCUTANEOUS

## 2022-08-26 ENCOUNTER — Encounter (HOSPITAL_COMMUNITY): Payer: Self-pay | Admitting: Cardiology

## 2022-08-26 ENCOUNTER — Ambulatory Visit (HOSPITAL_COMMUNITY)
Admission: RE | Admit: 2022-08-26 | Discharge: 2022-08-26 | Disposition: A | Discharge status: Home / Self Care | Payer: Medicare Other | Source: Ambulatory Visit | Attending: Cardiology | Admitting: Cardiology

## 2022-08-26 VITALS — BP 110/68 | HR 61 | Wt 228.6 lb

## 2022-08-26 DIAGNOSIS — I080 Rheumatic disorders of both mitral and aortic valves: Secondary | ICD-10-CM | POA: Diagnosis not present

## 2022-08-26 DIAGNOSIS — R001 Bradycardia, unspecified: Secondary | ICD-10-CM | POA: Diagnosis not present

## 2022-08-26 DIAGNOSIS — I130 Hypertensive heart and chronic kidney disease with heart failure and stage 1 through stage 4 chronic kidney disease, or unspecified chronic kidney disease: Secondary | ICD-10-CM | POA: Diagnosis not present

## 2022-08-26 DIAGNOSIS — Z955 Presence of coronary angioplasty implant and graft: Secondary | ICD-10-CM | POA: Diagnosis not present

## 2022-08-26 DIAGNOSIS — Z951 Presence of aortocoronary bypass graft: Secondary | ICD-10-CM | POA: Insufficient documentation

## 2022-08-26 DIAGNOSIS — Z862 Personal history of diseases of the blood and blood-forming organs and certain disorders involving the immune mechanism: Secondary | ICD-10-CM | POA: Insufficient documentation

## 2022-08-26 DIAGNOSIS — Z9981 Dependence on supplemental oxygen: Secondary | ICD-10-CM | POA: Diagnosis not present

## 2022-08-26 DIAGNOSIS — N189 Chronic kidney disease, unspecified: Secondary | ICD-10-CM | POA: Insufficient documentation

## 2022-08-26 DIAGNOSIS — Z95811 Presence of heart assist device: Secondary | ICD-10-CM | POA: Diagnosis not present

## 2022-08-26 DIAGNOSIS — I5022 Chronic systolic (congestive) heart failure: Secondary | ICD-10-CM

## 2022-08-26 DIAGNOSIS — R0609 Other forms of dyspnea: Secondary | ICD-10-CM | POA: Diagnosis not present

## 2022-08-26 DIAGNOSIS — Z7901 Long term (current) use of anticoagulants: Secondary | ICD-10-CM | POA: Insufficient documentation

## 2022-08-26 DIAGNOSIS — I5042 Chronic combined systolic (congestive) and diastolic (congestive) heart failure: Secondary | ICD-10-CM | POA: Diagnosis not present

## 2022-08-26 DIAGNOSIS — E785 Hyperlipidemia, unspecified: Secondary | ICD-10-CM | POA: Diagnosis not present

## 2022-08-26 DIAGNOSIS — I209 Angina pectoris, unspecified: Secondary | ICD-10-CM | POA: Diagnosis not present

## 2022-08-26 DIAGNOSIS — Z7985 Long-term (current) use of injectable non-insulin antidiabetic drugs: Secondary | ICD-10-CM | POA: Insufficient documentation

## 2022-08-26 DIAGNOSIS — Z7902 Long term (current) use of antithrombotics/antiplatelets: Secondary | ICD-10-CM | POA: Insufficient documentation

## 2022-08-26 DIAGNOSIS — I447 Left bundle-branch block, unspecified: Secondary | ICD-10-CM | POA: Insufficient documentation

## 2022-08-26 DIAGNOSIS — I2510 Atherosclerotic heart disease of native coronary artery without angina pectoris: Secondary | ICD-10-CM | POA: Diagnosis not present

## 2022-08-26 DIAGNOSIS — Z6841 Body Mass Index (BMI) 40.0 and over, adult: Secondary | ICD-10-CM | POA: Insufficient documentation

## 2022-08-26 DIAGNOSIS — Z79899 Other long term (current) drug therapy: Secondary | ICD-10-CM | POA: Diagnosis not present

## 2022-08-26 DIAGNOSIS — I480 Paroxysmal atrial fibrillation: Secondary | ICD-10-CM | POA: Insufficient documentation

## 2022-08-26 DIAGNOSIS — G4733 Obstructive sleep apnea (adult) (pediatric): Secondary | ICD-10-CM | POA: Diagnosis not present

## 2022-08-26 DIAGNOSIS — Z8673 Personal history of transient ischemic attack (TIA), and cerebral infarction without residual deficits: Secondary | ICD-10-CM | POA: Diagnosis not present

## 2022-08-26 LAB — BASIC METABOLIC PANEL
Anion gap: 7 (ref 5–15)
BUN: 18 mg/dL (ref 8–23)
CO2: 29 mmol/L (ref 22–32)
Calcium: 9.2 mg/dL (ref 8.9–10.3)
Chloride: 105 mmol/L (ref 98–111)
Creatinine, Ser: 0.95 mg/dL (ref 0.44–1.00)
GFR, Estimated: >60 mL/min (ref >60)
Glucose, Bld: 140 mg/dL — ABNORMAL HIGH (ref 70–99)
Potassium: 4.9 mmol/L (ref 3.5–5.1)
Sodium: 141 mmol/L (ref 135–145)

## 2022-08-26 LAB — BRAIN NATRIURETIC PEPTIDE: B Natriuretic Peptide: 67 pg/mL (ref 0.0–100.0)

## 2022-08-26 LAB — CBC
HCT: 45.3 % (ref 36.0–46.0)
Hemoglobin: 15 g/dL (ref 12.0–15.0)
MCH: 31.3 pg (ref 26.0–34.0)
MCHC: 33.1 g/dL (ref 30.0–36.0)
MCV: 94.4 fL (ref 80.0–100.0)
Platelets: 176 10*3/uL (ref 150–400)
RBC: 4.8 MIL/uL (ref 3.87–5.11)
RDW: 13.5 % (ref 11.5–15.5)
WBC: 7 10*3/uL (ref 4.0–10.5)
nRBC: 0 % (ref 0.0–0.2)

## 2022-08-26 LAB — LIPID PANEL
Cholesterol: 165 mg/dL (ref 0–200)
HDL: 65 mg/dL (ref >40)
LDL Cholesterol: 83 mg/dL (ref 0–99)
Total CHOL/HDL Ratio: 2.5 RATIO
Triglycerides: 85 mg/dL (ref <150)
VLDL: 17 mg/dL (ref 0–40)

## 2022-08-26 NOTE — Progress Notes (Signed)
ReDS Vest / Clip - 08/26/22 1200       ReDS Vest / Clip   Station Marker A    Ruler Value 28    ReDS Value Range Low volume    ReDS Actual Value 35

## 2022-08-26 NOTE — Progress Notes (Signed)
Date:  08/26/2022   ID:  OREE MIRELEZ, DOB 1945/09/07, [name] [id number]   Provider location: [place] [facility] Type of Visit: Established patient   PCP:  Mosetta Putt, MD  HF Cardiologist:  Dr. Shirlee Latch  Chief Complaint: Follow up for CAD and CHF.   History of Present Illness: [name] [name] is a 77 y.o. female who has a history of CAD s/p CABG, diastolic CHF, and cerebrovascular disease with history of CVA presents for followup of CHF and diastolic CHF. She had CABG x 5 in 12/11.  Prior to the CABG she had multiple PCIs.  She had a CVA in 2/14 that presented as visual loss.  She had a left carotid stent in 2/15.    Left heart cath done in Sept. 2014. This showed patent SVG-D, LIMA-LAD, and sequential SVG-OM branches but SVG-RCA and the native RCA were both totally occluded. It was felt aht her increased symptoms coincided with occlusion of SVG-RCA.  She was started on Imdur to see if this would help with the chest pain and dyspnea.  However, she feels like Imdur causes leg cramps and does not think that she can take it.  So ranolazine 500 mg bid started and titrated this up to 1000 mg bid.  This helped some but not markedly. Therefore, sent to  Dr. Eldridge Dace to address opening RCA CTO. He was able to do this in 5/15 with 4 overlapping Xience DES.  This led to resolution of exertional chest pain.     [name] [name] was started on Brilinta after PCI.  She became much more short of breath after starting on Brilinta. Brilinta stopped and replaced with Plavix.  Dyspnea improved off Brilinta. Given increasing exertional dyspnea and chest pain,  RHC/LHC was done in 4/17.  This showed stable CAD with no interventional target.  Left and right heart filling pressures were not significantly increased.  Medical management.    She had an MRI/MRA in May 2018 that showed moderately severe stenosis of the supraclinoid segment of the right internal carotid artery, progressed since the 2016 MR  angiogram, and severe basilar artery stenosis.    She developed recurrent exertional chest pain and had LHC in 6/18, showing 4/5 grafts patent with patent native RCA (similar to prior cath, no changes).     Echo 1/19 with EF 55-60%, moderate diastolic dysfunction, PASP 50 mmHg, mildly dilated RV, mild AS, mild-moderate MR.    She reported increased dyspnea and chest pain and had RHC/LHC in 9/19.  She had DES to sequential SVG-OM1/PLOM.  While hospitalized, she was noted to have runs of atrial fibrillation.  She was very symptomatic with the atrial fibrillation.  Eliquis and amiodarone were started.    She was admitted 12/16-12/19/19 for cardiomems implant and RHC. She was diuresed with IV lasix and transitioned to torsemide 100 mg BID + metolazone once/week. DC weight: 295 lbs.   She had a prolonged hospitalization in 2/20 with acute respiratory failure and fever to 103.  She was thought to have PNA and treated with cefepime.  She was intubated.  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  Amiodarone was stopped and she was put on prednisone. Echo in 2/20 showed EF down to 25-30% with mid-apical LV severe hypokinesis. She had AKI. Possible ITP triggered by infection, HIT negative.  ITP was treated with Solumedrol. She was discharged to SNF.    ORIF left ankle 4/20.   11/20 admission  initially with concern for TIAs (staring spells), ended up thinking possible partial seizures.  Head MRI did not show CVA.  Echo in 11/20 showed EF 45-50%, moderately decreased RV systolic function, moderate RV enlargement, moderate-severe MR, moderate TR.   Patient was admitted again in 12/20 with AKI and hypotension in the setting of sinus bradycardia (HR 30s).  She was volume overloaded on exam.  Toprol XL was stopped and HR increased to 60s.  She was diuresed with IV Lasix.  TEE in 12/20 showed EF 50-55%, septal-lateral dyssynchrony, mildly decreased RV function, moderate MR.   Atypical  chest pain in 1/21, Cardiolite showed EF 57%, fixed inferior defect (likely artifact), no ischemia.   Echo in 5/22 showed EF 60-65%, moderate LVH, normal RV, PASP 37 mmHg, 2.5 x 2.1 cm calcified mass posterior mitral annulus with mild-moderate MR => ?severe MAC but more circumscribed and prominent than in the past, mild AS mean gradient 12 mmHg and AVA 1.83 cm^2.  Echo in 11/22 showed EF 55%, normal RV, mass posterior mitral annulus, moderate MR, mild AS, IVC normal.  Cardiac MRI was done in 1/23 to investigate posterior mitral annulus mass; study showed LV EF 48%, mild LV dilation, normal RV with EF 53%, extensive MAC is likely the source of the posterior mitral annulus mass, basal-mid inferior >50% subendocardial LGE suggestive of prior inferior MI.   Follow up 2/23 she had chest tightness x 4 weeks. Arranged for [facility] which showed in-stent restenosis of pRCA, felt to be the source of her symptoms given occluded SVG-RCA. Underwent PTCA to pRCA. RHC with normal PCWP and RA pressure, mild pulmonary hypertension, preserved CO. On ASA + Plavix + Eliquis x 1 month (until 05/21/21), then Plavix + Eliquis.   Today she returns for HF follow up. Weight is up about 8 lbs. She has used 3 NTGs in the last 4-5 weeks for chest tightness, usually when walking around the house. She has dyspnea walking more than 50-100 feet, this seems to be chronic.  No orthopnea/PND.  She uses walker for balance. Poor energy level overall. Occasional palpitations.  She is in NSR today.   ECG (personally reviewed): NSR, LBBB 170 msec  Cardiomems: PADP (7/24): 7 (goal 10)  REDS clip 35%  Labs (1/19): K 4.9, creatinine 1.22 Labs (9/19): LDL 108 Labs (10/19): hgb 69.6, K 4.3, creatinine 1.03 Labs 12/06/2017: K 4.7 Creatinine 1.7 Labs (3/20): K 4.8, creatinine 1.39, hgb 12.4 plts 226 Labs (11/20): K 4.2, creatinine 1.01, LDL 38 Labs (12/20): K 3.5, creatinine 1.8 Labs (3/21): K 4.5, creatinine 1.5 Labs (2/22): K 4.2, creatinine  1.13 Labs (4/22): LDL 58 Labs (9/22): K 4.3, creatinine 1.27 Labs (12/22): K 4.4, creatinine 1.13 Labs (3/23): K 4.3, creatinine 1.02 Labs (5/23): K 4.3, creatinine 1.33 Labs (6/23): BNP 140, K 4.5, creatinine 0.98 Labs (10/23): K 4.2, creatinine 0.86 Labs (12/23): K 4.4, creatinine 0.90, LDL 111 Labs (3/24): BNP 163, K 4.7, creatinine 0.9   PMH: 1. Diabetic gastroparesis 2. Type II diabetes 3. HTN 4. Morbid obesity 5. CAD: s/p CABG in 12/11 after prior PCIs.  LIMA-LAD, SVG-D, seq SVG-OM1 and OM2, SVG-PDA. Adenosine Cardiolite (8/14) with EF 53% and a small reversible apical defect with a medium, partially reversible inferior defect.  LHC (9/14) with patent SVG-D, LIMA-LAD (50% distal LAD), and sequential SVG-OM branches; the SVG-RCA and the native RCA were occluded.  This was managed medically initially, but with ongoing exertional chest pain, it was decided to open CTO.  Patient had CTO opening  with 4 overlapping Xience DES in the RCA in 5/15.   - LHC (4/17): SVG-D patent, LIMA-LAD patent, distal LAD with several 40-50% stenoses, sequential SVG-OM1 and PLOM patent with 50% proximal stenosis (not flow limiting), SVG-RCA TO with patent RCA stents.  - LHC (6/18): 4/5 grafts patent (SVG-RCA TO, same as past), RCA stents patent => no change.  - LHC (9/19): Sequential SVG-PLOM/OM1 with 80% proximal stenosis, s/p DES.  - Cardiolite (1/21): EF 57%, fixed inferior defect (likely artifact), no ischemia.  - LHC (3/23): severe CAD with 90% in-stent restenosis of pRCA, s/p PTCA to in-stent restenosis of pRCA 6. Atypical migraines 7. OHS/OSA: Intolerant of CPAP. Uses oxygen with exertion and at night.  8. GERD with hiatal hernia 9. OA 10. HFPEF: She has a Cardiomems device.  Echo (11/13) with EF 50-55%, grade II diastolic dysfunction, mild-moderate MR.  Echo (8/14) with EF 55-60%, grade II diastolic dysfunction, mildly increased aortic valve gradient (mean 12 mmHg) but valve opens well, mild MR and  mild RV dilation.  Echo (9/15) with EF 60-65%, grade II diastolic dysfunction, mild aortic stenosis, mild mitral stenosis, mild to moderate mitral regurgitation, mildly dilated RV with normal systolic function, PA systolic pressure 46 mmHg.  - RHC (4/17): mean RA 9, PA 38/15 mean 27, mean PCWP 9, CI 2.5.  - Echo (5/17): EF 55-60%, mild LVH, mildly dilated RV with low normal systolic function, moderate TR, PASP 61 mmHg - Echo (1/19): EF 55-60%, moderate diastolic dysfunction, PASP 50 mmHg, mildly dilated RV, mild AS, mild-moderate MR. - RHC (9/19): mean RA 11, PA 34/12, mean PCWP 15, CI 2.85 - Echo (3/20): EF 25-30% with mid-apical severe hypokinesis.  - Echo (11/20): EF 45-50%, moderately dilated RV with moderately decreased systolic function, moderate-severe MR, moderate TR.  - TEE (12/20): EF 50-55%, septal-lateral dyssynchrony, moderate RV dilation/mildly decreased function, peak RV-RA gradient 51 mmHg, moderate MR.  - Echo (5/22): EF 60-65%, moderate LVH, normal RV, PASP 37 mmHg, 2.5 x 2.1 cm calcified mass posterior mitral annulus with mild-moderate MR => ?severe MAC but more circumscribed and prominent than in the past, mild AS mean gradient 12 mmHg and AVA 1.83 cm^2.  - Echo (11/22): EF 55%, normal RV, mass posterior mitral annulus, moderate MR, mild AS, IVC normal. - Cardiac MRI in 1/23 showed LV EF 48%, mild LV dilation, normal RV with EF 53%, extensive MAC is likely the source of the posterior mitral annulus mass, basal-mid inferior >50% subendocardial LGE suggestive of prior inferior MI.  - RHC (3/23): normal PCWP (14) and RA pressure (2) , mild pulmonary hypertension, preserved CO/CI (6.51/3.28). 11. CKD 12. Chronic LBBB 13. Anxiety 14. Carotid stenosis: Followed by [facility], >80% LICA stenosis 12/14.  She had left carotid stent in 2/15. Carotid dopplers 8/15 with no significant disease. Carotid dopplers (4/16): < 40% RICA stenosis.  - Carotid dopplers (9/22): 1-39% BICA stenosis.  15.  Cerebrovascular disease: CVA 2/14 with right posterior cerebral artery territory ischemic infarction. Cerebral angiogram in 6/14 showed 70% right vertebral ostial stenosis, 60-65% LICA stenosis, > 70% proximal left posterior cerebral artery stenosis, posterior communicating artery aneurysm.  Patient has had episodes of transient expressive aphasia.  Carotid dopplers (12/14) showed >80% LICA stenosis. She had a left carotid stent 2/15.  Possible CVA in 7/15. -  MRI/MRA in May 2018 that showed moderately severe stenosis of the supraclinoid segment of the right internal carotid artery, progressed since the 2016 MR angiogram and severe basilar artery stenosis.  16. Positional vertigo (suspected) 17.  Palpitations: Holter (6/15) with rare PVCs/PACs.  - Event monitor (2/18): NSR, occasional PVCs - 14 day Zio (9/23) mostly NSR, rare PVCs and PACs, few short runs of SVT, no atrial fibrillation or worrisome arrhythmias. 18. Dyspnea with Brilinta. 19. Melanoma: On face, s/p excision.   20. Aortic stenosis: Mild on 11/22 echo.  21. Posterior mitral annular mass: Cardiac MRI was done in 1/23 to investigate posterior mitral annulus mass; study showed LV EF 48%, mild LV dilation, normal RV with EF 53%, extensive MAC is likely the source of the posterior mitral annulus mass, basal-mid inferior >50% subendocardial LGE suggestive of prior inferior MI.  22. Lower extremity arterial dopplers (9/16) were normal.  23. Sleep study (4/18): No significant OSA.  24. Atrial fibrillation: Paroxysmal - Amiodarone stopped, ?lung toxicity.  25. H/o ITP in 3/20.  26. Partial seizures 27. Mitral regurgitation: Moderate on TEE in 12/20.  - Mild to moderate on 5/22 echo.  - Moderate on 11/22 echo  Current Outpatient Medications  Medication Sig Dispense Refill   acetaminophen (TYLENOL) 500 MG tablet Take 500 mg by mouth every 6 (six) hours as needed for mild pain, moderate pain, fever or headache.     albuterol (VENTOLIN HFA)  108 (90 Base) MCG/ACT inhaler Inhale 2 puffs into the lungs every 4 (four) hours as needed for wheezing or shortness of breath.      ALPRAZolam (XANAX) 0.5 MG tablet Take 0.5 mg by mouth daily. 1 tab daily ,2 tab at hs     amLODipine (NORVASC) 5 MG tablet Take 1 tablet (5 mg total) by mouth at bedtime. 90 tablet 3   apixaban (ELIQUIS) 5 MG TABS tablet Take 1 tablet (5 mg total) by mouth 2 (two) times daily. 180 tablet 3   B Complex-C (B-COMPLEX WITH VITAMIN C) tablet Take 1 tablet by mouth in the morning.     baclofen (LIORESAL) 10 MG tablet Take 10 mg by mouth at bedtime.     bisacodyl (DULCOLAX) 5 MG EC tablet Take 5 mg by mouth daily as needed for moderate constipation.     buPROPion (WELLBUTRIN XL) 300 MG 24 hr tablet Take 300 mg by mouth daily.     carboxymethylcellulose (REFRESH PLUS) 0.5 % SOLN Place 1 drop into both eyes in the morning, at noon, and at bedtime.     Cholecalciferol (VITAMIN D-3) 25 MCG (1000 UT) CAPS Take 2,000 Units by mouth daily in the afternoon.     clopidogrel (PLAVIX) 75 MG tablet Take 75 mg by mouth every morning.     denosumab (PROLIA) 60 MG/ML SOSY injection Inject 60 mg into the skin every 6 (six) months.     DULoxetine (CYMBALTA) 30 MG capsule Take 1 capsule (30 mg total) by mouth daily. 30 capsule 0   ezetimibe (ZETIA) 10 MG tablet Take 1 tablet (10 mg total) by mouth at bedtime. 90 tablet 3   fluocinonide cream (LIDEX) 0.05 % Apply 1 application. topically 2 (two) times daily as needed (irritation).     Fluticasone-Salmeterol (ADVAIR) 250-50 MCG/DOSE AEPB Inhale 1 puff into the lungs daily.     gabapentin (NEURONTIN) 600 MG tablet Take 600-1,200 mg by mouth See admin instructions. Take 1 tablet (600 mg) by mouth in the morning & take 2 tablets (1200 mg) by mouth at bedtime     inclisiran (LEQVIO) 284 MG/1.5ML SOSY injection Inject 284 mg into the skin once.     Insulin Glargine (LANTUS SOLOSTAR) 100 UNIT/ML Solostar Pen Inject 18 Units into the  skin at bedtime.  Per sliding scale     insulin lispro (HUMALOG) 100 UNIT/ML KwikPen Inject 8 Units into the skin See admin instructions. Inject 8 units subcutaneously prior to breakfast and supper; add 4 units for CBG >200     loperamide (IMODIUM A-D) 2 MG tablet Take 2 mg by mouth 4 (four) times daily as needed for diarrhea or loose stools.     Multiple Vitamin (MULTIVITAMIN WITH MINERALS) TABS tablet Take 1 tablet by mouth every morning. Centrum - Women over 50     Multiple Vitamins-Minerals (OCUVITE EYE HEALTH FORMULA) CAPS Take 1 capsule by mouth every morning.     nitroGLYCERIN (NITROSTAT) 0.3 MG SL tablet DISSOLVE 1 TABLET UNDER TONGUE EVERY 5 MINUTES FOR UP TO 3 DOSES AS NEEDED FOR CHEST PAIN 100 tablet 0   ondansetron (ZOFRAN-ODT) 4 MG disintegrating tablet Take 4 mg by mouth 2 (two) times daily.     OXYGEN Inhale 2 L into the lungs at bedtime as needed (shortness of breath).      pantoprazole (PROTONIX) 40 MG tablet Take 1 tablet (40 mg total) by mouth daily. 30 tablet 0   pyridOXINE (VITAMIN B6) 100 MG tablet Take 100 mg by mouth every morning.     ranolazine (RANEXA) 1000 MG SR tablet TAKE ONE TABLET BY MOUTH TWICE DAILY 180 tablet 3   rosuvastatin (CRESTOR) 40 MG tablet Take 1 tablet (40 mg total) by mouth daily. 90 tablet 3   sacubitril-valsartan (ENTRESTO) 97-103 MG Take 1 tablet by mouth 2 (two) times daily. 180 tablet 3   Semaglutide, 2 MG/DOSE, (OZEMPIC, 2 MG/DOSE,) 8 MG/3ML SOPN Inject 2 mg into the skin once a week. 3 mL 11   spironolactone (ALDACTONE) 25 MG tablet TAKE 1 TABLET BY MOUTH DAILY 90 tablet 3   torsemide (DEMADEX) 10 MG tablet TAKE ONE (1) TABLET BY MOUTH EVERY DAY 90 tablet 3   traZODone (DESYREL) 50 MG tablet Take 5,100 mg by mouth at bedtime.     vitamin B-12 (CYANOCOBALAMIN) 1000 MCG tablet Take 1,000 mcg by mouth every morning.      zolpidem (AMBIEN) 10 MG tablet Take 10 mg by mouth at bedtime.     No current facility-administered medications for this encounter.     Allergies:   Amoxicillin, Brilinta [ticagrelor], Erythromycin, Flagyl [metronidazole], Penicillins, Isosorbide mononitrate [isosorbide nitrate], Jardiance [empagliflozin], Metformin and related, and Tape   Social History:  The patient  reports that she has never smoked. She has never used smokeless tobacco. She reports that she does not drink alcohol and does not use drugs.   Family History:  The patient's family history includes AAA (abdominal aortic aneurysm) in her father and mother; Cancer in her sister; Deep vein thrombosis in her father and mother; Dementia in her maternal grandmother and mother; Diabetes in her father, paternal aunt, paternal grandmother, paternal uncle, and paternal uncle; Heart attack in her father; Heart attack (age of onset: 28) in her paternal grandfather; Heart disease in her father and paternal uncle; Hyperlipidemia in her father; Hypertension in her father, mother, and sister; Stroke in her maternal grandmother, paternal grandmother, and paternal uncle.   ROS:  Please see the history of present illness.   All other systems are personally reviewed and negative.   Recent Labs: 08/26/2022: B Natriuretic Peptide 67.0; BUN 18; Creatinine, Ser 0.95; Hemoglobin 15.0; Platelets 176; Potassium 4.9; Sodium 141  Personally reviewed   Wt Readings from Last 3 Encounters:  08/26/22 103.7 kg (228 lb 9.6 oz)  04/14/22 99.9 kg (220 lb 3.2 oz)  03/30/22 99.8 kg (220 lb)    BP 110/68   Pulse 61   Wt 103.7 kg (228 lb 9.6 oz)   SpO2 97%   BMI 43.19 kg/m   Physical Exam:   General: NAD Neck: No JVD, no thyromegaly or thyroid nodule.  Lungs: Clear to auscultation bilaterally with normal respiratory effort. CV: Nondisplaced PMI.  Heart regular S1/S2, no S3/S4, 2/6 early SEM RUSB.  No peripheral edema.  No carotid bruit.  Normal pedal pulses.  Abdomen: Soft, nontender, no hepatosplenomegaly, no distention.  Skin: Intact without lesions or rashes.  Neurologic: Alert and  oriented x 3.  Psych: Normal affect. Extremities: No clubbing or cyanosis.  HEENT: Normal.   Assessment & Plan: 1. CAD: Occluded native RCA and SVG-RCA.  Status post opening of CTO RCA with 4 overlapping Xience DES in 5/15.  DES to sequential SVG-OM1/PLOM in 9/19. LHC in 3/23 showed severe native coronary disease with 90% in-stent restenosis in the proximal RCA (patient has 2 layers of stent at this site). The sequential SVG-OM1/PLOM was patent, SVG-D was patent, and LIMA-LAD was patent. She underwent PTCA of pRCA. She has used NTG 3 times in the last month. Exertional dyspnea is more prominent.  - Continue apixaban and Plavix (has been on chronic Plavix, neuro has wanted her to stay on this for cerebrovascular disease).   - Off Imdur with headache. - Off Toprol XL with bradycardia.   - Continue ranolazine 1000 mg bid.  - With increased NTG usage, I will arrange for cardiac PET to assess for ischemia.   2. Chronic systolic => diastolic CHF: Echo in 1/19 with EF 55-60%, moderate diastolic dysfunction. Cardiomems placed 01/16/18.  Echo in 3/20 in setting of severe PNA with intubation showed EF 25-30%, mid-apical LV severe hypokinesis.  Possible stress (Takotsubo-type) cardiomyopathy related to severe medical illness versus progression of CAD.  She has baseline chronic LBBB which could play a role in cardiomyopathy as well (LBBB cardiomyopathy).  Repeat echo in 11/20 with EF up to 45-50%, moderately dysfunctional RV.  TEE in 12/20 with EF 50-55% with septal-lateral dyssynchrony and mildly dysfunctional RV.  Most recent echo in 11/22 with EF 55% with normal RV.  cMRI in 1/23 with EF 48% and evidence for prior inferior MI.  Ongoing NYHA class III symptoms.  She does not appear volume overloaded on exam and Cardiomems does not suggest volume overload.  REDS clip is borderline.  - Continue spiro 25 mg daily. BMET/BNP today - Continue torsemide 10 mg daily. - Continue Entresto 97/103 mg bid.  - Cannot  tolerate SGLT2i. - Not on beta blocker due to bradycardia.  - I will arrange for repeat echo.  If EF drops significantly again, her LBBB is wide and she may benefit from CRT.  3. Hyperlipidemia: She remains on Zetia, Crestor and Leqvio.  - Followed in lipid clinic, check lipids today.  4. Hypertension: BP controlled. She has had orthostatic symptoms and has fallen in the past.  - Continue amlodipine 5 mg daily.  5. Cerebrovascular disease: She has history of CVA and had left carotid stent in 2/15. Followed by [facility].  Last MRA head showed severe stenosis of supraclinoid RICA and severe basilar artery stenosis.  - Neurology would like her to continue Plavix for this despite concomitant apixaban use.  6. OHS/OSA: Cannot tolerate CPAP.  Uses oxygen at night.  7. Atrial fibrillation: Paroxysmal. 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  No recent palpitations, NSR today.  - Continue apixaban 5 mg bid. CBC today. 8. Obesity: Body mass index is 43.19 kg/m. - She is now on Ozempic. 9. Mitral regurgitation: Moderate on 11/22 echo.  - Arranging for repeat echo as above.  10. Bradycardia: Now off Toprol XL.   11. Aortic stenosis: Mild on 11/22 echo.  - Arranging for repeat echo as above.  12. Mitral annular mass: This is a well-circumscribed calcified posterior mitral annulus mass noted on 5/22 and 11/22 echoes.  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  Follow up in 6 wks with APP.   Signed, Marca Ancona, MD  08/26/2022  [facility] [address] [facility] [place] [address] [phone] (office) [phone] (fax)

## 2022-08-26 NOTE — Patient Instructions (Addendum)
There has been no changes to your medications.  Labs done today, your results will be available in MyChart, we will contact you for abnormal readings.  Your physician has requested that you have an echocardiogram. Echocardiography is a painless test that uses sound waves to create images of your heart. It provides your doctor with information about the size and shape of your heart and how well your heart's chambers and valves are working. This procedure takes approximately one hour. There are no restrictions for this procedure. Please do NOT wear cologne, perfume, aftershave, or lotions (deodorant is allowed). Please arrive 15 minutes prior to your appointment time.  How to Prepare for Your Cardiac PET/CT Stress Test:  1. Please do not take these medications before your test:   Medications that may interfere with the cardiac pharmacological stress agent (ex. nitrates - including erectile dysfunction medications, isosorbide mononitrate, tamulosin or beta-blockers) the day of the exam. (Erectile dysfunction medication should be held for at least 72 hrs prior to test) Theophylline containing medications for 12 hours. Dipyridamole 48 hours prior to the test. Your remaining medications may be taken with water.  2. Nothing to eat or drink, except water, 3 hours prior to arrival time.   NO caffeine/decaffeinated products, or chocolate 12 hours prior to arrival.  3. NO perfume, cologne or lotion  4. Total time is 1 to 2 hours; you may want to bring reading material for the waiting time.  5. Please report to Radiology at the [facility] Main Entrance 30 minutes early for your test.  [address] [place], [place] [address]  6. Please report to Radiology at [facility] Main Entrance, medical mall, 30 mins prior to your test.  [address]  [place], [place]  [phone]  Diabetic Preparation:  Hold oral medications. You may take NPH and Lantus  insulin. Do not take Humalog or Humulin R (Regular Insulin) the day of your test. Check blood sugars prior to leaving the house. If able to eat breakfast prior to 3 hour fasting, you may take all medications, including your insulin, Do not worry if you miss your breakfast dose of insulin - start at your next meal.  IF YOU THINK YOU MAY BE PREGNANT, OR ARE NURSING PLEASE INFORM THE TECHNOLOGIST.  In preparation for your appointment, medication and supplies will be purchased.  Appointment availability is limited, so if you need to cancel or reschedule, please call the Radiology Department at [phone] [facility]) [place] [phone] [facility])  24 hours in advance to avoid a cancellation fee of $100.00  What to Expect After you Arrive:  Once you arrive and check in for your appointment, you will be taken to a preparation room within the Radiology Department.  A technologist or Nurse will obtain your medical history, verify that you are correctly prepped for the exam, and explain the procedure.  Afterwards,  an IV will be started in your arm and electrodes will be placed on your skin for EKG monitoring during the stress portion of the exam. Then you will be escorted to the PET/CT scanner.  There, staff will get you positioned on the scanner and obtain a blood pressure and EKG.  During the exam, you will continue to be connected to the EKG and blood pressure machines.  A small, safe amount of a radioactive tracer will be injected in your IV to obtain a series of pictures of your heart along with an injection of a stress agent.  After your Exam:  It is recommended that you eat a meal and drink a caffeinated beverage to counter act any effects of the stress agent.  Drink plenty of fluids for the remainder of the day and urinate frequently for the first couple of hours after the exam.  Your doctor will inform you of your test results within 7-10 business days.  For more information and frequently asked  questions, please visit our website : [email or web address]  For questions about your test or how to prepare for your test, please call: Cardiac Imaging Nurse Navigators Office: [phone]   Your physician recommends that you schedule a follow-up appointment in: 6 weeks.  If you have any questions or concerns before your next appointment please send [place] a message through [place] or call our office at [phone].    TO LEAVE A MESSAGE FOR THE NURSE SELECT OPTION 2, PLEASE LEAVE A MESSAGE INCLUDING: YOUR NAME DATE OF BIRTH CALL BACK NUMBER REASON FOR CALL**this is important as we prioritize the call backs  YOU WILL RECEIVE A CALL BACK THE SAME DAY AS LONG AS YOU CALL BEFORE 4:00 PM  At the [facility], you and your health needs are our priority. As part of our continuing mission to provide you with exceptional heart care, we have created designated Provider Care Teams. These Care Teams include your primary Cardiologist (physician) and Advanced Practice Providers (APPs- Physician Assistants and Nurse Practitioners) who all work together to provide you with the care you need, when you need it.   You may see any of the following providers on your designated Care Team at your next follow up: Dr Arvilla Meres Dr Marca Ancona Dr. Marcos Eke, NP Robbie Lis, [place] [facility] [place], [place] Brynda Peon, NP Karle Plumber, PharmD   Please be sure to bring in all your medications bottles to every appointment.    Thank you for choosing [facility]

## 2022-08-27 ENCOUNTER — Telehealth: Payer: Self-pay | Admitting: Pharmacist

## 2022-08-27 MED ORDER — EZETIMIBE 10 MG PO TABS: 10.0000 mg | ORAL_TABLET | Freq: Every day | ORAL | 3 refills | Status: AC

## 2022-08-27 MED ORDER — ROSUVASTATIN CALCIUM 40 MG PO TABS: 40.0000 mg | ORAL_TABLET | Freq: Every day | ORAL | 3 refills | Status: AC

## 2022-08-27 NOTE — Telephone Encounter (Signed)
Reviewed lipid panel results with pt.  Lipid meds optimized on rosuvastatin 40mg  daily, ezetimibe 10mg  daily, and Leqvio 284mg  Q6M (2 injections given so far). Changed from Praluent to [facility] after suboptimal recent response to Praluent (LDL previously in the 50s on med, then increased to the low 100s on both lipid panels last year despite med adherence). LDL mildly improved on Leqvio but still above goal. Only other option would be replacing ezetimibe with Nexlizet but copay is unaffordable currently given other branded meds. Encouraged continued med adherence and lifestyle improvements.

## 2022-09-09 ENCOUNTER — Ambulatory Visit (HOSPITAL_COMMUNITY)
Admission: RE | Admit: 2022-09-09 | Discharge: 2022-09-09 | Disposition: A | Discharge status: Home / Self Care | Payer: Medicare Other | Source: Ambulatory Visit

## 2022-09-09 DIAGNOSIS — I130 Hypertensive heart and chronic kidney disease with heart failure and stage 1 through stage 4 chronic kidney disease, or unspecified chronic kidney disease: Secondary | ICD-10-CM | POA: Diagnosis not present

## 2022-09-09 DIAGNOSIS — I340 Nonrheumatic mitral (valve) insufficiency: Secondary | ICD-10-CM | POA: Diagnosis not present

## 2022-09-09 DIAGNOSIS — E1122 Type 2 diabetes mellitus with diabetic chronic kidney disease: Secondary | ICD-10-CM | POA: Insufficient documentation

## 2022-09-09 DIAGNOSIS — N183 Chronic kidney disease, stage 3 unspecified: Secondary | ICD-10-CM | POA: Diagnosis not present

## 2022-09-09 DIAGNOSIS — E785 Hyperlipidemia, unspecified: Secondary | ICD-10-CM | POA: Insufficient documentation

## 2022-09-09 DIAGNOSIS — I5022 Chronic systolic (congestive) heart failure: Secondary | ICD-10-CM | POA: Diagnosis present

## 2022-09-09 LAB — ECHOCARDIOGRAM COMPLETE
AR max vel: 1.85 cm2
AV Area VTI: 1.8 cm2
AV Area mean vel: 1.86 cm2
AV Mean grad: 4 mmHg
AV Peak grad: 8.1 mmHg
Ao pk vel: 1.42 m/s
Area-P 1/2: 3.31 cm2
S' Lateral: 4.7 cm

## 2022-09-09 NOTE — Progress Notes (Signed)
*  PRELIMINARY RESULTS* Echocardiogram 2D Echocardiogram has been performed.  [name] [name] 09/09/2022, 10:42 AM

## 2022-09-22 ENCOUNTER — Telehealth (HOSPITAL_COMMUNITY): Payer: Self-pay | Admitting: Adult Health

## 2022-09-22 NOTE — Telephone Encounter (Signed)
Pt aware and voiced understanding 

## 2022-09-22 NOTE — Telephone Encounter (Signed)
ensor Reading  Taken on : 09-21-2022, 04:28 PM  PA Systolic PA Mean PA Diastolic Heart Rate  Sensor:    39 mmHg 25 mmHg 16 mmHg 64 bpm    Goal 10    Cardiomems Remote Monitoring  S/P Cardiomems Implant   PAD Goal: 10 Most recent reading: 16 Suggestive of fluid accumulation   Recommended changes: Take 20 mg torsemide daily x 3 days then back 10 mg daily   I continue to review and analyze the patients PA pressures weekly (and more often as needed) to bring PA pressures within the optimal range.    Marlene Beidler NP-C  7:28 AM

## 2022-10-01 ENCOUNTER — Encounter (HOSPITAL_COMMUNITY): Payer: Self-pay

## 2022-10-01 ENCOUNTER — Other Ambulatory Visit (HOSPITAL_COMMUNITY): Payer: Self-pay

## 2022-10-01 ENCOUNTER — Telehealth (HOSPITAL_COMMUNITY): Payer: Self-pay | Admitting: *Deleted

## 2022-10-01 ENCOUNTER — Ambulatory Visit (HOSPITAL_COMMUNITY)
Admission: RE | Admit: 2022-10-01 | Discharge: 2022-10-01 | Disposition: A | Discharge status: Home / Self Care | Payer: Medicare Other | Source: Ambulatory Visit | Attending: Family Medicine | Admitting: Family Medicine

## 2022-10-01 VITALS — BP 152/82 | HR 50 | Wt 231.6 lb

## 2022-10-01 DIAGNOSIS — E669 Obesity, unspecified: Secondary | ICD-10-CM | POA: Diagnosis not present

## 2022-10-01 DIAGNOSIS — Z7902 Long term (current) use of antithrombotics/antiplatelets: Secondary | ICD-10-CM | POA: Insufficient documentation

## 2022-10-01 DIAGNOSIS — Z951 Presence of aortocoronary bypass graft: Secondary | ICD-10-CM | POA: Diagnosis not present

## 2022-10-01 DIAGNOSIS — I25119 Atherosclerotic heart disease of native coronary artery with unspecified angina pectoris: Secondary | ICD-10-CM

## 2022-10-01 DIAGNOSIS — Z6841 Body Mass Index (BMI) 40.0 and over, adult: Secondary | ICD-10-CM | POA: Diagnosis not present

## 2022-10-01 DIAGNOSIS — E782 Mixed hyperlipidemia: Secondary | ICD-10-CM

## 2022-10-01 DIAGNOSIS — G4733 Obstructive sleep apnea (adult) (pediatric): Secondary | ICD-10-CM

## 2022-10-01 DIAGNOSIS — E785 Hyperlipidemia, unspecified: Secondary | ICD-10-CM | POA: Diagnosis not present

## 2022-10-01 DIAGNOSIS — Z79899 Other long term (current) drug therapy: Secondary | ICD-10-CM | POA: Diagnosis not present

## 2022-10-01 DIAGNOSIS — Z8673 Personal history of transient ischemic attack (TIA), and cerebral infarction without residual deficits: Secondary | ICD-10-CM

## 2022-10-01 DIAGNOSIS — I10 Essential (primary) hypertension: Secondary | ICD-10-CM

## 2022-10-01 DIAGNOSIS — I447 Left bundle-branch block, unspecified: Secondary | ICD-10-CM | POA: Insufficient documentation

## 2022-10-01 DIAGNOSIS — Z7901 Long term (current) use of anticoagulants: Secondary | ICD-10-CM | POA: Insufficient documentation

## 2022-10-01 DIAGNOSIS — I350 Nonrheumatic aortic (valve) stenosis: Secondary | ICD-10-CM

## 2022-10-01 DIAGNOSIS — E1122 Type 2 diabetes mellitus with diabetic chronic kidney disease: Secondary | ICD-10-CM | POA: Insufficient documentation

## 2022-10-01 DIAGNOSIS — N189 Chronic kidney disease, unspecified: Secondary | ICD-10-CM | POA: Diagnosis not present

## 2022-10-01 DIAGNOSIS — R001 Bradycardia, unspecified: Secondary | ICD-10-CM

## 2022-10-01 DIAGNOSIS — I130 Hypertensive heart and chronic kidney disease with heart failure and stage 1 through stage 4 chronic kidney disease, or unspecified chronic kidney disease: Secondary | ICD-10-CM | POA: Insufficient documentation

## 2022-10-01 DIAGNOSIS — I651 Occlusion and stenosis of basilar artery: Secondary | ICD-10-CM | POA: Insufficient documentation

## 2022-10-01 DIAGNOSIS — I5042 Chronic combined systolic (congestive) and diastolic (congestive) heart failure: Secondary | ICD-10-CM

## 2022-10-01 DIAGNOSIS — I480 Paroxysmal atrial fibrillation: Secondary | ICD-10-CM

## 2022-10-01 DIAGNOSIS — I340 Nonrheumatic mitral (valve) insufficiency: Secondary | ICD-10-CM

## 2022-10-01 LAB — BRAIN NATRIURETIC PEPTIDE: B Natriuretic Peptide: 228.4 pg/mL — ABNORMAL HIGH (ref 0.0–100.0)

## 2022-10-01 LAB — CBC
HCT: 45.2 % (ref 36.0–46.0)
Hemoglobin: 14.9 g/dL (ref 12.0–15.0)
MCH: 31 pg (ref 26.0–34.0)
MCHC: 33 g/dL (ref 30.0–36.0)
MCV: 94.2 fL (ref 80.0–100.0)
Platelets: 145 10*3/uL — ABNORMAL LOW (ref 150–400)
RBC: 4.8 MIL/uL (ref 3.87–5.11)
RDW: 13.4 % (ref 11.5–15.5)
WBC: 7.5 10*3/uL (ref 4.0–10.5)
nRBC: 0 % (ref 0.0–0.2)

## 2022-10-01 NOTE — Patient Instructions (Signed)
Medication Changes:  No Changes In Medications at this time.   Lab Work:  Labs done today, your results will be available in MyChart, we will contact you for abnormal readings.  Testing/Procedures:  Your physician has requested that you have a cardiac catheterization. Cardiac catheterization is used to diagnose and/or treat various heart conditions. Doctors may recommend this procedure for a number of different reasons. The most common reason is to evaluate chest pain. Chest pain can be a symptom of coronary artery disease (CAD), and cardiac catheterization can show whether plaque is narrowing or blocking your heart's arteries. This procedure is also used to evaluate the valves, as well as measure the blood flow and oxygen levels in different parts of your heart. For further information please visit [email or web address] Please follow instruction sheet, as given.  Special Instructions // Education:   [facility] [address] [place] [address] Dept: [phone] Loc: [phone]  [name] [name]  10/01/2022  You are scheduled for a Cardiac Catheterization on TUESDAY, September 17TH with Dr. Marca Ancona.  1. Please arrive at the [facility] (Main Entrance A) at [facility]: [address] [place], [place] [address] at 11:30 AM (This time is 2 hour(s) before your procedure to ensure your preparation). Free valet parking service is available. You will check in at ADMITTING. The support person will be asked to wait in the waiting room.  It is OK to have someone drop you off and come back when you are ready to be discharged.    Special note: Every effort is made to have your procedure done on time. Please understand that emergencies sometimes delay scheduled procedures.  2. Diet: Do not eat solid foods after midnight.  The patient may have clear liquids until 5am upon the day of the procedure.  3.  Labs: You will need to have blood drawn on TODAY  4. Medication instructions in preparation for your procedure:   Contrast Allergy: No  DO NOT TAKE ELIQUIS THE DAY BEFORE OR THE DAY OF PROCEDURE   Take only 9 units of insulin the night before your procedure. Do not take any insulin on the day of the procedure.  DO NOT TAKE ANY DIABETIC MEDICATION THE MORNING OF PROCEDURE  DO NOT TAKE ANY SPIRONOLACTONE THE MORNING OF PROCEDURE   DO NOT TAKE ANY TORSEMIDE THE MORNING OF PROCEDURE   5. Plan to go home the same day, you will only stay overnight if medically necessary. 6. Bring a current list of your medications and current insurance cards. 7. You MUST have a responsible person to drive you home. 8. Someone MUST be with you the first 24 hours after you arrive home or your discharge will be delayed. 9. Please wear clothes that are easy to get on and off and wear slip-on shoes.  Thank you for allowing [place] to care for you!   -- [facility]  Follow-Up in: 2 WEEKS AFTER CATH AS SCHEDULED   At the [facility], you and your health needs are our priority. We have a designated team specialized in the treatment of Heart Failure. This Care Team includes your primary Heart Failure Specialized Cardiologist (physician), Advanced Practice Providers (APPs- Physician Assistants and Nurse Practitioners), and Pharmacist who all work together to provide you with the care you need, when you need it.   You may see any of the following providers on your designated Care  Team at your next follow up:  Dr. Arvilla Meres Dr. Marca Ancona Dr. Dorthula Nettles Tonye Becket, NP Robbie Lis, [place] [facility] [place], [place] Brynda Peon, NP Karle Plumber, PharmD   Please be sure to bring in all your medications bottles to every appointment.   Need to Contact us:  If you have any questions or concerns before your next appointment please send [place] a  message through [place] or call our office at [phone].    TO LEAVE A MESSAGE FOR THE NURSE SELECT OPTION 2, PLEASE LEAVE A MESSAGE INCLUDING: YOUR NAME DATE OF BIRTH CALL BACK NUMBER REASON FOR CALL**this is important as we prioritize the call backs  YOU WILL RECEIVE A CALL BACK THE SAME DAY AS LONG AS YOU CALL BEFORE 4:00 PM

## 2022-10-01 NOTE — Addendum Note (Signed)
Addended by: Bea Laura B on: 10/01/2022 02:30 PM   Modules accepted: Orders

## 2022-10-01 NOTE — Progress Notes (Signed)
Orders placed for [facility] on 9/20

## 2022-10-01 NOTE — Telephone Encounter (Signed)
Patient seen today in [facility] Clinic. Patient scheduled for [facility]- Cardiac PET cancelled at this time per Shanda Bumps, NP  Will make cardiac imaging navigators aware.

## 2022-10-01 NOTE — H&P (View-Only) (Signed)
Date:  10/01/2022   ID:  [name], DOB [birth year], [name] [id number]   Provider location: [facility] Type of Visit: Established patient   PCP:  [name] Putt, MD  HF Cardiologist:  Dr. Shirlee Latch  Chief Complaint: Follow up for CAD and CHF.   History of Present Illness: [name] [name] is a 77 y.o. female who has a history of CAD s/p CABG, diastolic CHF, and cerebrovascular disease with history of CVA presents for followup of CHF and diastolic CHF. She had CABG x 5 in 12/11.  Prior to the CABG she had multiple PCIs.  She had a CVA in 2/14 that presented as visual loss.  She had a left carotid stent in 2/15.    Left heart cath done in Sept. 2014. This showed patent SVG-D, LIMA-LAD, and sequential SVG-OM branches but SVG-RCA and the native RCA were both totally occluded. It was felt that her increased symptoms coincided with occlusion of SVG-RCA.  She was started on Imdur to see if this would help with the chest pain and dyspnea.  However, she feels like Imdur causes leg cramps and does not think that she can take it.  So ranolazine 500 mg bid started and titrated this up to 1000 mg bid.  This helped some but not markedly. Therefore, sent to Dr. Eldridge Dace to address opening RCA CTO. He was able to do this in 5/15 with 4 overlapping Xience DES.  This led to resolution of exertional chest pain.     [name] [name] was started on Brilinta after PCI.  She became much more short of breath after starting on Brilinta. Brilinta stopped and replaced with Plavix.  Dyspnea improved off Brilinta. Given increasing exertional dyspnea and chest pain,  RHC/LHC was done in 4/17.  This showed stable CAD with no interventional target.  Left and right heart filling pressures were not significantly increased.  Medical management.    She had an MRI/MRA in May 2018 that showed moderately severe stenosis of the supraclinoid segment of the right internal carotid artery, progressed since the 2016 MR  angiogram, and severe basilar artery stenosis.    She developed recurrent exertional chest pain and had LHC in 6/18, showing 4/5 grafts patent with patent native RCA (similar to prior cath, no changes).     Echo 1/19 with EF 55-60%, moderate diastolic dysfunction, PASP 50 mmHg, mildly dilated RV, mild AS, mild-moderate MR.    She reported increased dyspnea and chest pain and had RHC/LHC in 9/19.  She had DES to sequential SVG-OM1/PLOM.  While hospitalized, she was noted to have runs of atrial fibrillation.  She was very symptomatic with the atrial fibrillation.  Eliquis and amiodarone were started.    She was admitted 12/16-12/19/19 for cardiomems implant and RHC. She was diuresed with IV lasix and transitioned to torsemide 100 mg BID + metolazone once/week. DC weight: 295 lbs.   She had a prolonged hospitalization in 2/20 with acute respiratory failure and fever to 103.  She was thought to have PNA and treated with cefepime.  She was intubated.  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  Amiodarone was stopped and she was put on prednisone. Echo in 2/20 showed EF down to 25-30% with mid-apical LV severe hypokinesis. She had AKI. Possible ITP triggered by infection, HIT negative.  ITP was treated with Solumedrol. She was discharged to SNF.    ORIF left ankle 4/20.   11/20 admission initially  with concern for TIAs (staring spells), ended up thinking possible partial seizures.  Head MRI did not show CVA.  Echo in 11/20 showed EF 45-50%, moderately decreased RV systolic function, moderate RV enlargement, moderate-severe MR, moderate TR.   Patient was admitted again in 12/20 with AKI and hypotension in the setting of sinus bradycardia (HR 30s).  She was volume overloaded on exam.  Toprol XL was stopped and HR increased to 60s.  She was diuresed with IV Lasix.  TEE in 12/20 showed EF 50-55%, septal-lateral dyssynchrony, mildly decreased RV function, moderate MR.   Atypical  chest pain in 1/21, Cardiolite showed EF 57%, fixed inferior defect (likely artifact), no ischemia.   Echo in 5/22 showed EF 60-65%, moderate LVH, normal RV, PASP 37 mmHg, 2.5 x 2.1 cm calcified mass posterior mitral annulus with mild-moderate MR => ?severe MAC but more circumscribed and prominent than in the past, mild AS mean gradient 12 mmHg and AVA 1.83 cm^2.  Echo in 11/22 showed EF 55%, normal RV, mass posterior mitral annulus, moderate MR, mild AS, IVC normal.  Cardiac MRI was done in 1/23 to investigate posterior mitral annulus mass; study showed LV EF 48%, mild LV dilation, normal RV with EF 53%, extensive MAC is likely the source of the posterior mitral annulus mass, basal-mid inferior >50% subendocardial LGE suggestive of prior inferior MI.   Follow up 2/23 she had chest tightness x 4 weeks. Arranged for [facility] which showed in-stent restenosis of pRCA, felt to be the source of her symptoms given occluded SVG-RCA. Underwent PTCA to pRCA. RHC with normal PCWP and RA pressure, mild pulmonary hypertension, preserved CO. On ASA + Plavix + Eliquis x 1 month (until 05/21/21), then Plavix + Eliquis.   Echo 8/24 showed EF significantly worse, down to 30-35%, G1DD, normal RV.   Today she returns for HF follow up and to discuss echo results, which showed significant reduced EF. Overall feeling fatigued. She has SOB with "moving" also has dyspnea when at rest.  Continues with chest heaviness. Occasional palpitations, has abdominal fullness and is chronically dizzy. She is completely fatigued after bathing. Denies abnormal bleeding, edema, or orthopnea. Has mild PND. She chronically sleeps reclined. Appetite ok. No fever or chills. Weight at home 229 pounds. Taking all medications.   ECG (personally reviewed): SB, LBBB QRS 176 bpm  Cardiomems today 10/01/22 PAD 9, goal 10  Labs (1/19): K 4.9, creatinine 1.22 Labs (9/19): LDL 108 Labs (10/19): hgb 16.1, K 4.3, creatinine 1.03 Labs 12/06/2017: K 4.7  Creatinine 1.7 Labs (3/20): K 4.8, creatinine 1.39, hgb 12.4 plts 226 Labs (11/20): K 4.2, creatinine 1.01, LDL 38 Labs (12/20): K 3.5, creatinine 1.8 Labs (3/21): K 4.5, creatinine 1.5 Labs (2/22): K 4.2, creatinine 1.13 Labs (4/22): LDL 58 Labs (9/22): K 4.3, creatinine 1.27 Labs (12/22): K 4.4, creatinine 1.13 Labs (3/23): K 4.3, creatinine 1.02 Labs (5/23): K 4.3, creatinine 1.33 Labs (6/23): BNP 140, K 4.5, creatinine 0.98 Labs (10/23): K 4.2, creatinine 0.86 Labs (12/23): K 4.4, creatinine 0.90, LDL 111 Labs (3/24): BNP 163, K 4.7, creatinine 0.9 Labs (7/24): K 4.9, creatinine 0.95, LDL 83   PMH: 1. Diabetic gastroparesis 2. Type II diabetes 3. HTN 4. Morbid obesity 5. CAD: s/p CABG in 12/11 after prior PCIs.  LIMA-LAD, SVG-D, seq SVG-OM1 and OM2, SVG-PDA. Adenosine Cardiolite (8/14) with EF 53% and a small reversible apical defect with a medium, partially reversible inferior defect.  LHC (9/14) with patent SVG-D, LIMA-LAD (50% distal LAD), and sequential SVG-OM branches;  the SVG-RCA and the native RCA were occluded.  This was managed medically initially, but with ongoing exertional chest pain, it was decided to open CTO.  Patient had CTO opening with 4 overlapping Xience DES in the RCA in 5/15.   - LHC (4/17): SVG-D patent, LIMA-LAD patent, distal LAD with several 40-50% stenoses, sequential SVG-OM1 and PLOM patent with 50% proximal stenosis (not flow limiting), SVG-RCA TO with patent RCA stents.  - LHC (6/18): 4/5 grafts patent (SVG-RCA TO, same as past), RCA stents patent => no change.  - LHC (9/19): Sequential SVG-PLOM/OM1 with 80% proximal stenosis, s/p DES.  - Cardiolite (1/21): EF 57%, fixed inferior defect (likely artifact), no ischemia.  - LHC (3/23): severe CAD with 90% in-stent restenosis of pRCA, s/p PTCA to in-stent restenosis of pRCA 6. Atypical migraines 7. OHS/OSA: Intolerant of CPAP. Uses oxygen with exertion and at night.  8. GERD with hiatal hernia 9. OA 10.  HFpEF: She has a Cardiomems device.  Echo (11/13) with EF 50-55%, grade II diastolic dysfunction, mild-moderate MR.  Echo (8/14) with EF 55-60%, grade II diastolic dysfunction, mildly increased aortic valve gradient (mean 12 mmHg) but valve opens well, mild MR and mild RV dilation.  Echo (9/15) with EF 60-65%, grade II diastolic dysfunction, mild aortic stenosis, mild mitral stenosis, mild to moderate mitral regurgitation, mildly dilated RV with normal systolic function, PA systolic pressure 46 mmHg.  - RHC (4/17): mean RA 9, PA 38/15 mean 27, mean PCWP 9, CI 2.5.  - Echo (5/17): EF 55-60%, mild LVH, mildly dilated RV with low normal systolic function, moderate TR, PASP 61 mmHg - Echo (1/19): EF 55-60%, moderate diastolic dysfunction, PASP 50 mmHg, mildly dilated RV, mild AS, mild-moderate MR. - RHC (9/19): mean RA 11, PA 34/12, mean PCWP 15, CI 2.85 - Echo (3/20): EF 25-30% with mid-apical severe hypokinesis.  - Echo (11/20): EF 45-50%, moderately dilated RV with moderately decreased systolic function, moderate-severe MR, moderate TR.  - TEE (12/20): EF 50-55%, septal-lateral dyssynchrony, moderate RV dilation/mildly decreased function, peak RV-RA gradient 51 mmHg, moderate MR.  - Echo (5/22): EF 60-65%, moderate LVH, normal RV, PASP 37 mmHg, 2.5 x 2.1 cm calcified mass posterior mitral annulus with mild-moderate MR => ?severe MAC but more circumscribed and prominent than in the past, mild AS mean gradient 12 mmHg and AVA 1.83 cm^2.  - Echo (11/22): EF 55%, normal RV, mass posterior mitral annulus, moderate MR, mild AS, IVC normal. - Cardiac MRI in 1/23 showed LV EF 48%, mild LV dilation, normal RV with EF 53%, extensive MAC is likely the source of the posterior mitral annulus mass, basal-mid inferior >50% subendocardial LGE suggestive of prior inferior MI.  - RHC (3/23): normal PCWP (14) and RA pressure (2) , mild pulmonary hypertension, preserved CO/CI (6.51/3.28). - Echo (7/24): EF 30-35%, G1DD,  normal RV.  11. CKD 12. Chronic LBBB 13. Anxiety 14. Carotid stenosis: Followed by [facility], >80% LICA stenosis 12/14.  She had left carotid stent in 2/15. Carotid dopplers 8/15 with no significant disease. Carotid dopplers (4/16): < 40% RICA stenosis.  - Carotid dopplers (9/22): 1-39% BICA stenosis.  15. Cerebrovascular disease: CVA 2/14 with right posterior cerebral artery territory ischemic infarction. Cerebral angiogram in 6/14 showed 70% right vertebral ostial stenosis, 60-65% LICA stenosis, > 70% proximal left posterior cerebral artery stenosis, posterior communicating artery aneurysm.  Patient has had episodes of transient expressive aphasia.  Carotid dopplers (12/14) showed >80% LICA stenosis. She had a left carotid stent 2/15.  Possible CVA  in 7/15. - MRI/MRA in May 2018 that showed moderately severe stenosis of the supraclinoid segment of the right internal carotid artery, progressed since the 2016 MR angiogram and severe basilar artery stenosis.  16. Positional vertigo (suspected) 17. Palpitations: Holter (6/15) with rare PVCs/PACs.  - Event monitor (2/18): NSR, occasional PVCs - 14 day Zio (9/23) mostly NSR, rare PVCs and PACs, few short runs of SVT, no atrial fibrillation or worrisome arrhythmias. 18. Dyspnea with Brilinta. 19. Melanoma: On face, s/p excision.   20. Aortic stenosis: Mild on 11/22 echo.  21. Posterior mitral annular mass: Cardiac MRI was done in 1/23 to investigate posterior mitral annulus mass; study showed LV EF 48%, mild LV dilation, normal RV with EF 53%, extensive MAC is likely the source of the posterior mitral annulus mass, basal-mid inferior >50% subendocardial LGE suggestive of prior inferior MI.  22. Lower extremity arterial dopplers (9/16) were normal.  23. Sleep study (4/18): No significant OSA.  24. Atrial fibrillation: Paroxysmal - Amiodarone stopped, ?lung toxicity.  25. H/o ITP in 3/20.  26. Partial seizures 27. Mitral regurgitation: Moderate on TEE in  12/20.  - Mild to moderate on 5/22 echo.  - Moderate on 11/22 echo  Current Outpatient Medications  Medication Sig Dispense Refill   acetaminophen (TYLENOL) 500 MG tablet Take 500 mg by mouth every 6 (six) hours as needed for mild pain, moderate pain, fever or headache.     albuterol (VENTOLIN HFA) 108 (90 Base) MCG/ACT inhaler Inhale 2 puffs into the lungs every 4 (four) hours as needed for wheezing or shortness of breath.      ALPRAZolam (XANAX) 0.5 MG tablet Take 0.5 mg by mouth daily. 1 tab daily ,2 tab at hs     amLODipine (NORVASC) 5 MG tablet Take 1 tablet (5 mg total) by mouth at bedtime. 90 tablet 3   apixaban (ELIQUIS) 5 MG TABS tablet Take 1 tablet (5 mg total) by mouth 2 (two) times daily. 180 tablet 3   B Complex-C (B-COMPLEX WITH VITAMIN C) tablet Take 1 tablet by mouth in the morning.     baclofen (LIORESAL) 10 MG tablet Take 10 mg by mouth at bedtime.     bisacodyl (DULCOLAX) 5 MG EC tablet Take 5 mg by mouth daily as needed for moderate constipation.     buPROPion (WELLBUTRIN XL) 300 MG 24 hr tablet Take 300 mg by mouth daily.     carboxymethylcellulose (REFRESH PLUS) 0.5 % SOLN Place 1 drop into both eyes in the morning, at noon, and at bedtime.     Cholecalciferol (VITAMIN D-3) 25 MCG (1000 UT) CAPS Take 2,000 Units by mouth daily in the afternoon.     clopidogrel (PLAVIX) 75 MG tablet Take 75 mg by mouth every morning.     denosumab (PROLIA) 60 MG/ML SOSY injection Inject 60 mg into the skin every 6 (six) months.     DULoxetine (CYMBALTA) 30 MG capsule Take 1 capsule (30 mg total) by mouth daily. 30 capsule 0   ezetimibe (ZETIA) 10 MG tablet Take 1 tablet (10 mg total) by mouth at bedtime. 90 tablet 3   fluocinonide cream (LIDEX) 0.05 % Apply 1 application. topically 2 (two) times daily as needed (irritation).     Fluticasone-Salmeterol (ADVAIR) 250-50 MCG/DOSE AEPB Inhale 1 puff into the lungs daily.     gabapentin (NEURONTIN) 600 MG tablet Take 600-1,200 mg by mouth See  admin instructions. Take 1 tablet (600 mg) by mouth in the morning & take 2 tablets (  1200 mg) by mouth at bedtime     inclisiran (LEQVIO) 284 MG/1.5ML SOSY injection Inject 284 mg into the skin once.     Insulin Glargine (LANTUS SOLOSTAR) 100 UNIT/ML Solostar Pen Inject 18 Units into the skin at bedtime. Per sliding scale     insulin lispro (HUMALOG) 100 UNIT/ML KwikPen Inject 8 Units into the skin See admin instructions. Inject 8 units subcutaneously prior to breakfast and supper; add 4 units for CBG >200     loperamide (IMODIUM A-D) 2 MG tablet Take 2 mg by mouth 4 (four) times daily as needed for diarrhea or loose stools.     Multiple Vitamin (MULTIVITAMIN WITH MINERALS) TABS tablet Take 1 tablet by mouth every morning. Centrum - Women over 50     Multiple Vitamins-Minerals (OCUVITE EYE HEALTH FORMULA) CAPS Take 1 capsule by mouth every morning.     nitroGLYCERIN (NITROSTAT) 0.3 MG SL tablet DISSOLVE 1 TABLET UNDER TONGUE EVERY 5 MINUTES FOR UP TO 3 DOSES AS NEEDED FOR CHEST PAIN 100 tablet 0   ondansetron (ZOFRAN-ODT) 4 MG disintegrating tablet Take 4 mg by mouth 2 (two) times daily.     OXYGEN Inhale 2 L into the lungs at bedtime as needed (shortness of breath).      pantoprazole (PROTONIX) 40 MG tablet Take 1 tablet (40 mg total) by mouth daily. 30 tablet 0   pyridOXINE (VITAMIN B6) 100 MG tablet Take 100 mg by mouth every morning.     ranolazine (RANEXA) 1000 MG SR tablet TAKE ONE TABLET BY MOUTH TWICE DAILY 180 tablet 3   rosuvastatin (CRESTOR) 40 MG tablet Take 1 tablet (40 mg total) by mouth daily. 90 tablet 3   sacubitril-valsartan (ENTRESTO) 97-103 MG Take 1 tablet by mouth 2 (two) times daily. 180 tablet 3   Semaglutide, 2 MG/DOSE, (OZEMPIC, 2 MG/DOSE,) 8 MG/3ML SOPN Inject 2 mg into the skin once a week. 3 mL 11   spironolactone (ALDACTONE) 25 MG tablet TAKE 1 TABLET BY MOUTH DAILY 90 tablet 3   torsemide (DEMADEX) 10 MG tablet TAKE ONE (1) TABLET BY MOUTH EVERY DAY 90 tablet 3    traZODone (DESYREL) 50 MG tablet Take 5,100 mg by mouth at bedtime.     vitamin B-12 (CYANOCOBALAMIN) 1000 MCG tablet Take 1,000 mcg by mouth every morning.      zolpidem (AMBIEN) 10 MG tablet Take 10 mg by mouth at bedtime.     No current facility-administered medications for this encounter.   Allergies:   Amoxicillin, Brilinta [ticagrelor], Erythromycin, Flagyl [metronidazole], Penicillins, Isosorbide mononitrate [isosorbide nitrate], Jardiance [empagliflozin], Metformin and related, and Tape   Social History:  The patient  reports that she has never smoked. She has never used smokeless tobacco. She reports that she does not drink alcohol and does not use drugs.   Family History:  The patient's family history includes AAA (abdominal aortic aneurysm) in her father and mother; Cancer in her sister; Deep vein thrombosis in her father and mother; Dementia in her maternal grandmother and mother; Diabetes in her father, paternal aunt, paternal grandmother, paternal uncle, and paternal uncle; Heart attack in her father; Heart attack (age of onset: 26) in her paternal grandfather; Heart disease in her father and paternal uncle; Hyperlipidemia in her father; Hypertension in her father, mother, and sister; Stroke in her maternal grandmother, paternal grandmother, and paternal uncle.   ROS:  Please see the history of present illness.   All other systems are personally reviewed and negative.   Recent Labs:  08/26/2022: B Natriuretic Peptide 67.0; BUN 18; Creatinine, Ser 0.95; Hemoglobin 15.0; Platelets 176; Potassium 4.9; Sodium 141  Personally reviewed   Wt Readings from Last 3 Encounters:  10/01/22 105.1 kg (231 lb 9.6 oz)  08/26/22 103.7 kg (228 lb 9.6 oz)  04/14/22 99.9 kg (220 lb 3.2 oz)    BP (!) 152/82   Pulse (!) 50   Wt 105.1 kg (231 lb 9.6 oz)   SpO2 96%   BMI 43.76 kg/m   Physical Exam:   General:  NAD. No resp difficulty, walked into clinic with rolling walker. HEENT: Normal Neck:  Supple. No JVD. Carotids 2+ bilat; no bruits. No lymphadenopathy or thryomegaly appreciated. Cor: PMI nondisplaced. Regular rate & rhythm. No rubs, gallops, 2/6 SEM RUSB Lungs: Clear Abdomen: Soft, obese, nontender, nondistended. No hepatosplenomegaly. No bruits or masses. Good bowel sounds. Extremities: No cyanosis, clubbing, rash, edema Neuro: Alert & oriented x 3, cranial nerves grossly intact. Moves all 4 extremities w/o difficulty. Affect pleasant.  Assessment & Plan: 1. CAD: Occluded native RCA and SVG-RCA.  Status post opening of CTO RCA with 4 overlapping Xience DES in 5/15.  DES to sequential SVG-OM1/PLOM in 9/19. LHC in 3/23 showed severe native coronary disease with 90% in-stent restenosis in the proximal RCA (patient has 2 layers of stent at this site). The sequential SVG-OM1/PLOM was patent, SVG-D was patent, and LIMA-LAD was patent. She underwent PTCA of pRCA. She has chronic angina, however exertional dyspnea has been more prominent. Recent echo showed EF further reduced, now 30-35%. Recommend  LHC to evaluate for worsening ischemic disease. Discussed with patient, and Dr. Shirlee Latch. Will arrange soon. Informed Consent   Shared Decision Making/Informed Consent The risks [stroke (1 in 1000), death (1 in 1000), kidney failure [usually temporary] (1 in 500), bleeding (1 in 200), allergic reaction [possibly serious] (1 in 200)], benefits (diagnostic support and management of coronary artery disease) and alternatives of a cardiac catheterization were discussed in detail with [name] [name] and she is willing to proceed.       - Continue apixaban and Plavix (has been on chronic Plavix, neuro has wanted her to stay on this for cerebrovascular disease).   - Continue ranolazine 1000 mg bid.  - Off Imdur with headache. - Off Toprol XL with bradycardia.   - Cardiac PET to assess for ischemia was previously arranged, will cancel this and move forward with LHC. 2. Chronic systolic => diastolic CHF: Echo  in 1/19 with EF 55-60%, moderate diastolic dysfunction. Cardiomems placed 01/16/18.  Echo in 3/20 in setting of severe PNA with intubation showed EF 25-30%, mid-apical LV severe hypokinesis.  Possible stress (Takotsubo-type) cardiomyopathy related to severe medical illness versus progression of CAD.  She has baseline chronic LBBB which could play a role in cardiomyopathy as well (LBBB cardiomyopathy).  Repeat echo in 11/20 with EF up to 45-50%, moderately dysfunctional RV.  TEE in 12/20 with EF 50-55% with septal-lateral dyssynchrony and mildly dysfunctional RV.  Echo in 11/22 with EF 55% with normal RV.  cMRI in 1/23 with EF 48% and evidence for prior inferior MI.  Echo 7/24 30-35%, G1DD, normal RV.  Ongoing NYHA class III-IIIb symptoms.  She does not appear volume overloaded on exam and Cardiomems does not suggest volume overload, however she has recently required short increase in her loop diuretic due to elevated Cardiomems readings last week. - Continue spiro 25 mg daily. BMET/BNP today - Continue torsemide 10 mg daily. - Continue Entresto 97/103 mg bid.  - Cannot  tolerate SGLT2i. - Not on beta blocker due to bradycardia.  - Recommend RHC at time of LHC to evaluate hemodynamics. She is agreeable. Will arrange with Dr. Shirlee Latch. - With significant reduction in her EF, her LBBB is wide and she may benefit from CRT.  3. Hyperlipidemia: She remains on Zetia, Crestor and Leqvio.  - Followed in lipid clinic. LDL 83 (7/24) 4. Hypertension: BP mildly elevated today, but generally well-controlled on current regimen. She has had orthostatic symptoms and has fallen in the past.  - Continue amlodipine 5 mg daily.  5. Cerebrovascular disease: She has history of CVA and had left carotid stent in 2/15. Followed by [facility].  Last MRA head showed severe stenosis of supraclinoid RICA and severe basilar artery stenosis.  - Neurology would like her to continue Plavix for this despite concomitant apixaban use.  6. OHS/OSA:  Cannot tolerate CPAP.  Uses oxygen at night.  7. Atrial fibrillation: Paroxysmal. 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 No recent palpitations, NSR today.  - Continue apixaban 5 mg bid. CBC today. 8. Obesity: Body mass index is 43.76 kg/m. - She is now on Ozempic. 9. Mitral regurgitation: Moderate on 11/22 echo.  - Mild to moderate on 7/24 echo.  10. Bradycardia: Now off Toprol XL.   11. Aortic stenosis: Mild on 11/22 echo.  - Arranging for repeat echo as above.  12. Mitral annular mass: This is a well-circumscribed calcified posterior mitral annulus mass noted on 5/22 and 11/22 echoes.  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  Follow up with APP 2 weeks after cath.  Signed, Jacklynn Ganong, FNP  10/01/2022  [facility] [address] [facility] [place] [address] [phone] (office) [phone] (fax)

## 2022-10-01 NOTE — Progress Notes (Signed)
Date:  10/01/2022   ID:  [name] [name], DOB [birth year], [name] [id number]   Provider location: [facility] Type of Visit: Established patient   PCP:  Mosetta Putt, MD  HF Cardiologist:  Dr. Shirlee Latch  Chief Complaint: Follow up for CAD and CHF.   History of Present Illness: [name] [name] is a 77 y.o. female who has a history of CAD s/p CABG, diastolic CHF, and cerebrovascular disease with history of CVA presents for followup of CHF and diastolic CHF. She had CABG x 5 in 12/11.  Prior to the CABG she had multiple PCIs.  She had a CVA in 2/14 that presented as visual loss.  She had a left carotid stent in 2/15.    Left heart cath done in Sept. 2014. This showed patent SVG-D, LIMA-LAD, and sequential SVG-OM branches but SVG-RCA and the native RCA were both totally occluded. It was felt that her increased symptoms coincided with occlusion of SVG-RCA.  She was started on Imdur to see if this would help with the chest pain and dyspnea.  However, she feels like Imdur causes leg cramps and does not think that she can take it.  So ranolazine 500 mg bid started and titrated this up to 1000 mg bid.  This helped some but not markedly. Therefore, sent to Dr. Eldridge Dace to address opening RCA CTO. He was able to do this in 5/15 with 4 overlapping Xience DES.  This led to resolution of exertional chest pain.     Mrs Linke was started on Brilinta after PCI.  She became much more short of breath after starting on Brilinta. Brilinta stopped and replaced with Plavix.  Dyspnea improved off Brilinta. Given increasing exertional dyspnea and chest pain,  RHC/LHC was done in 4/17.  This showed stable CAD with no interventional target.  Left and right heart filling pressures were not significantly increased.  Medical management.    She had an MRI/MRA in May 2018 that showed moderately severe stenosis of the supraclinoid segment of the right internal carotid artery, progressed since the 2016 MR  angiogram, and severe basilar artery stenosis.    She developed recurrent exertional chest pain and had LHC in 6/18, showing 4/5 grafts patent with patent native RCA (similar to prior cath, no changes).     Echo 1/19 with EF 55-60%, moderate diastolic dysfunction, PASP 50 mmHg, mildly dilated RV, mild AS, mild-moderate MR.    She reported increased dyspnea and chest pain and had RHC/LHC in 9/19.  She had DES to sequential SVG-OM1/PLOM.  While hospitalized, she was noted to have runs of atrial fibrillation.  She was very symptomatic with the atrial fibrillation.  Eliquis and amiodarone were started.    She was admitted 12/16-12/19/19 for cardiomems implant and RHC. She was diuresed with IV lasix and transitioned to torsemide 100 mg BID + metolazone once/week. DC weight: 295 lbs.   She had a prolonged hospitalization in 2/20 with acute respiratory failure and fever to 103.  She was thought to have PNA and treated with cefepime.  She was intubated.  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  Amiodarone was stopped and she was put on prednisone. Echo in 2/20 showed EF down to 25-30% with mid-apical LV severe hypokinesis. She had AKI. Possible ITP triggered by infection, HIT negative.  ITP was treated with Solumedrol. She was discharged to SNF.    ORIF left ankle 4/20.   11/20 admission initially  with concern for TIAs (staring spells), ended up thinking possible partial seizures.  Head MRI did not show CVA.  Echo in 11/20 showed EF 45-50%, moderately decreased RV systolic function, moderate RV enlargement, moderate-severe MR, moderate TR.   Patient was admitted again in 12/20 with AKI and hypotension in the setting of sinus bradycardia (HR 30s).  She was volume overloaded on exam.  Toprol XL was stopped and HR increased to 60s.  She was diuresed with IV Lasix.  TEE in 12/20 showed EF 50-55%, septal-lateral dyssynchrony, mildly decreased RV function, moderate MR.   Atypical  chest pain in 1/21, Cardiolite showed EF 57%, fixed inferior defect (likely artifact), no ischemia.   Echo in 5/22 showed EF 60-65%, moderate LVH, normal RV, PASP 37 mmHg, 2.5 x 2.1 cm calcified mass posterior mitral annulus with mild-moderate MR => ?severe MAC but more circumscribed and prominent than in the past, mild AS mean gradient 12 mmHg and AVA 1.83 cm^2.  Echo in 11/22 showed EF 55%, normal RV, mass posterior mitral annulus, moderate MR, mild AS, IVC normal.  Cardiac MRI was done in 1/23 to investigate posterior mitral annulus mass; study showed LV EF 48%, mild LV dilation, normal RV with EF 53%, extensive MAC is likely the source of the posterior mitral annulus mass, basal-mid inferior >50% subendocardial LGE suggestive of prior inferior MI.   Follow up 2/23 she had chest tightness x 4 weeks. Arranged for [facility] which showed in-stent restenosis of pRCA, felt to be the source of her symptoms given occluded SVG-RCA. Underwent PTCA to pRCA. RHC with normal PCWP and RA pressure, mild pulmonary hypertension, preserved CO. On ASA + Plavix + Eliquis x 1 month (until 05/21/21), then Plavix + Eliquis.   Echo 8/24 showed EF significantly worse, down to 30-35%, G1DD, normal RV.   Today she returns for HF follow up and to discuss echo results, which showed significant reduced EF. Overall feeling fatigued. She has SOB with "moving" also has dyspnea when at rest.  Continues with chest heaviness. Occasional palpitations, has abdominal fullness and is chronically dizzy. She is completely fatigued after bathing. Denies abnormal bleeding, edema, or orthopnea. Has mild PND. She chronically sleeps reclined. Appetite ok. No fever or chills. Weight at home 229 pounds. Taking all medications.   ECG (personally reviewed): SB, LBBB QRS 176 bpm  Cardiomems today 10/01/22 PAD 9, goal 10  Labs (1/19): K 4.9, creatinine 1.22 Labs (9/19): LDL 108 Labs (10/19): hgb 16.1, K 4.3, creatinine 1.03 Labs 12/06/2017: K 4.7  Creatinine 1.7 Labs (3/20): K 4.8, creatinine 1.39, hgb 12.4 plts 226 Labs (11/20): K 4.2, creatinine 1.01, LDL 38 Labs (12/20): K 3.5, creatinine 1.8 Labs (3/21): K 4.5, creatinine 1.5 Labs (2/22): K 4.2, creatinine 1.13 Labs (4/22): LDL 58 Labs (9/22): K 4.3, creatinine 1.27 Labs (12/22): K 4.4, creatinine 1.13 Labs (3/23): K 4.3, creatinine 1.02 Labs (5/23): K 4.3, creatinine 1.33 Labs (6/23): BNP 140, K 4.5, creatinine 0.98 Labs (10/23): K 4.2, creatinine 0.86 Labs (12/23): K 4.4, creatinine 0.90, LDL 111 Labs (3/24): BNP 163, K 4.7, creatinine 0.9 Labs (7/24): K 4.9, creatinine 0.95, LDL 83   PMH: 1. Diabetic gastroparesis 2. Type II diabetes 3. HTN 4. Morbid obesity 5. CAD: s/p CABG in 12/11 after prior PCIs.  LIMA-LAD, SVG-D, seq SVG-OM1 and OM2, SVG-PDA. Adenosine Cardiolite (8/14) with EF 53% and a small reversible apical defect with a medium, partially reversible inferior defect.  LHC (9/14) with patent SVG-D, LIMA-LAD (50% distal LAD), and sequential SVG-OM branches;  the SVG-RCA and the native RCA were occluded.  This was managed medically initially, but with ongoing exertional chest pain, it was decided to open CTO.  Patient had CTO opening with 4 overlapping Xience DES in the RCA in 5/15.   - LHC (4/17): SVG-D patent, LIMA-LAD patent, distal LAD with several 40-50% stenoses, sequential SVG-OM1 and PLOM patent with 50% proximal stenosis (not flow limiting), SVG-RCA TO with patent RCA stents.  - LHC (6/18): 4/5 grafts patent (SVG-RCA TO, same as past), RCA stents patent => no change.  - LHC (9/19): Sequential SVG-PLOM/OM1 with 80% proximal stenosis, s/p DES.  - Cardiolite (1/21): EF 57%, fixed inferior defect (likely artifact), no ischemia.  - LHC (3/23): severe CAD with 90% in-stent restenosis of pRCA, s/p PTCA to in-stent restenosis of pRCA 6. Atypical migraines 7. OHS/OSA: Intolerant of CPAP. Uses oxygen with exertion and at night.  8. GERD with hiatal hernia 9. OA 10.  HFpEF: She has a Cardiomems device.  Echo (11/13) with EF 50-55%, grade II diastolic dysfunction, mild-moderate MR.  Echo (8/14) with EF 55-60%, grade II diastolic dysfunction, mildly increased aortic valve gradient (mean 12 mmHg) but valve opens well, mild MR and mild RV dilation.  Echo (9/15) with EF 60-65%, grade II diastolic dysfunction, mild aortic stenosis, mild mitral stenosis, mild to moderate mitral regurgitation, mildly dilated RV with normal systolic function, PA systolic pressure 46 mmHg.  - RHC (4/17): mean RA 9, PA 38/15 mean 27, mean PCWP 9, CI 2.5.  - Echo (5/17): EF 55-60%, mild LVH, mildly dilated RV with low normal systolic function, moderate TR, PASP 61 mmHg - Echo (1/19): EF 55-60%, moderate diastolic dysfunction, PASP 50 mmHg, mildly dilated RV, mild AS, mild-moderate MR. - RHC (9/19): mean RA 11, PA 34/12, mean PCWP 15, CI 2.85 - Echo (3/20): EF 25-30% with mid-apical severe hypokinesis.  - Echo (11/20): EF 45-50%, moderately dilated RV with moderately decreased systolic function, moderate-severe MR, moderate TR.  - TEE (12/20): EF 50-55%, septal-lateral dyssynchrony, moderate RV dilation/mildly decreased function, peak RV-RA gradient 51 mmHg, moderate MR.  - Echo (5/22): EF 60-65%, moderate LVH, normal RV, PASP 37 mmHg, 2.5 x 2.1 cm calcified mass posterior mitral annulus with mild-moderate MR => ?severe MAC but more circumscribed and prominent than in the past, mild AS mean gradient 12 mmHg and AVA 1.83 cm^2.  - Echo (11/22): EF 55%, normal RV, mass posterior mitral annulus, moderate MR, mild AS, IVC normal. - Cardiac MRI in 1/23 showed LV EF 48%, mild LV dilation, normal RV with EF 53%, extensive MAC is likely the source of the posterior mitral annulus mass, basal-mid inferior >50% subendocardial LGE suggestive of prior inferior MI.  - RHC (3/23): normal PCWP (14) and RA pressure (2) , mild pulmonary hypertension, preserved CO/CI (6.51/3.28). - Echo (7/24): EF 30-35%, G1DD,  normal RV.  11. CKD 12. Chronic LBBB 13. Anxiety 14. Carotid stenosis: Followed by [facility], >80% LICA stenosis 12/14.  She had left carotid stent in 2/15. Carotid dopplers 8/15 with no significant disease. Carotid dopplers (4/16): < 40% RICA stenosis.  - Carotid dopplers (9/22): 1-39% BICA stenosis.  15. Cerebrovascular disease: CVA 2/14 with right posterior cerebral artery territory ischemic infarction. Cerebral angiogram in 6/14 showed 70% right vertebral ostial stenosis, 60-65% LICA stenosis, > 70% proximal left posterior cerebral artery stenosis, posterior communicating artery aneurysm.  Patient has had episodes of transient expressive aphasia.  Carotid dopplers (12/14) showed >80% LICA stenosis. She had a left carotid stent 2/15.  Possible CVA  in 7/15. - MRI/MRA in May 2018 that showed moderately severe stenosis of the supraclinoid segment of the right internal carotid artery, progressed since the 2016 MR angiogram and severe basilar artery stenosis.  16. Positional vertigo (suspected) 17. Palpitations: Holter (6/15) with rare PVCs/PACs.  - Event monitor (2/18): NSR, occasional PVCs - 14 day Zio (9/23) mostly NSR, rare PVCs and PACs, few short runs of SVT, no atrial fibrillation or worrisome arrhythmias. 18. Dyspnea with Brilinta. 19. Melanoma: On face, s/p excision.   20. Aortic stenosis: Mild on 11/22 echo.  21. Posterior mitral annular mass: Cardiac MRI was done in 1/23 to investigate posterior mitral annulus mass; study showed LV EF 48%, mild LV dilation, normal RV with EF 53%, extensive MAC is likely the source of the posterior mitral annulus mass, basal-mid inferior >50% subendocardial LGE suggestive of prior inferior MI.  22. Lower extremity arterial dopplers (9/16) were normal.  23. Sleep study (4/18): No significant OSA.  24. Atrial fibrillation: Paroxysmal - Amiodarone stopped, ?lung toxicity.  25. H/o ITP in 3/20.  26. Partial seizures 27. Mitral regurgitation: Moderate on TEE in  12/20.  - Mild to moderate on 5/22 echo.  - Moderate on 11/22 echo  Current Outpatient Medications  Medication Sig Dispense Refill   acetaminophen (TYLENOL) 500 MG tablet Take 500 mg by mouth every 6 (six) hours as needed for mild pain, moderate pain, fever or headache.     albuterol (VENTOLIN HFA) 108 (90 Base) MCG/ACT inhaler Inhale 2 puffs into the lungs every 4 (four) hours as needed for wheezing or shortness of breath.      ALPRAZolam (XANAX) 0.5 MG tablet Take 0.5 mg by mouth daily. 1 tab daily ,2 tab at hs     amLODipine (NORVASC) 5 MG tablet Take 1 tablet (5 mg total) by mouth at bedtime. 90 tablet 3   apixaban (ELIQUIS) 5 MG TABS tablet Take 1 tablet (5 mg total) by mouth 2 (two) times daily. 180 tablet 3   B Complex-C (B-COMPLEX WITH VITAMIN C) tablet Take 1 tablet by mouth in the morning.     baclofen (LIORESAL) 10 MG tablet Take 10 mg by mouth at bedtime.     bisacodyl (DULCOLAX) 5 MG EC tablet Take 5 mg by mouth daily as needed for moderate constipation.     buPROPion (WELLBUTRIN XL) 300 MG 24 hr tablet Take 300 mg by mouth daily.     carboxymethylcellulose (REFRESH PLUS) 0.5 % SOLN Place 1 drop into both eyes in the morning, at noon, and at bedtime.     Cholecalciferol (VITAMIN D-3) 25 MCG (1000 UT) CAPS Take 2,000 Units by mouth daily in the afternoon.     clopidogrel (PLAVIX) 75 MG tablet Take 75 mg by mouth every morning.     denosumab (PROLIA) 60 MG/ML SOSY injection Inject 60 mg into the skin every 6 (six) months.     DULoxetine (CYMBALTA) 30 MG capsule Take 1 capsule (30 mg total) by mouth daily. 30 capsule 0   ezetimibe (ZETIA) 10 MG tablet Take 1 tablet (10 mg total) by mouth at bedtime. 90 tablet 3   fluocinonide cream (LIDEX) 0.05 % Apply 1 application. topically 2 (two) times daily as needed (irritation).     Fluticasone-Salmeterol (ADVAIR) 250-50 MCG/DOSE AEPB Inhale 1 puff into the lungs daily.     gabapentin (NEURONTIN) 600 MG tablet Take 600-1,200 mg by mouth See  admin instructions. Take 1 tablet (600 mg) by mouth in the morning & take 2 tablets (  1200 mg) by mouth at bedtime     inclisiran (LEQVIO) 284 MG/1.5ML SOSY injection Inject 284 mg into the skin once.     Insulin Glargine (LANTUS SOLOSTAR) 100 UNIT/ML Solostar Pen Inject 18 Units into the skin at bedtime. Per sliding scale     insulin lispro (HUMALOG) 100 UNIT/ML KwikPen Inject 8 Units into the skin See admin instructions. Inject 8 units subcutaneously prior to breakfast and supper; add 4 units for CBG >200     loperamide (IMODIUM A-D) 2 MG tablet Take 2 mg by mouth 4 (four) times daily as needed for diarrhea or loose stools.     Multiple Vitamin (MULTIVITAMIN WITH MINERALS) TABS tablet Take 1 tablet by mouth every morning. Centrum - Women over 50     Multiple Vitamins-Minerals (OCUVITE EYE HEALTH FORMULA) CAPS Take 1 capsule by mouth every morning.     nitroGLYCERIN (NITROSTAT) 0.3 MG SL tablet DISSOLVE 1 TABLET UNDER TONGUE EVERY 5 MINUTES FOR UP TO 3 DOSES AS NEEDED FOR CHEST PAIN 100 tablet 0   ondansetron (ZOFRAN-ODT) 4 MG disintegrating tablet Take 4 mg by mouth 2 (two) times daily.     OXYGEN Inhale 2 L into the lungs at bedtime as needed (shortness of breath).      pantoprazole (PROTONIX) 40 MG tablet Take 1 tablet (40 mg total) by mouth daily. 30 tablet 0   pyridOXINE (VITAMIN B6) 100 MG tablet Take 100 mg by mouth every morning.     ranolazine (RANEXA) 1000 MG SR tablet TAKE ONE TABLET BY MOUTH TWICE DAILY 180 tablet 3   rosuvastatin (CRESTOR) 40 MG tablet Take 1 tablet (40 mg total) by mouth daily. 90 tablet 3   sacubitril-valsartan (ENTRESTO) 97-103 MG Take 1 tablet by mouth 2 (two) times daily. 180 tablet 3   Semaglutide, 2 MG/DOSE, (OZEMPIC, 2 MG/DOSE,) 8 MG/3ML SOPN Inject 2 mg into the skin once a week. 3 mL 11   spironolactone (ALDACTONE) 25 MG tablet TAKE 1 TABLET BY MOUTH DAILY 90 tablet 3   torsemide (DEMADEX) 10 MG tablet TAKE ONE (1) TABLET BY MOUTH EVERY DAY 90 tablet 3    traZODone (DESYREL) 50 MG tablet Take 5,100 mg by mouth at bedtime.     vitamin B-12 (CYANOCOBALAMIN) 1000 MCG tablet Take 1,000 mcg by mouth every morning.      zolpidem (AMBIEN) 10 MG tablet Take 10 mg by mouth at bedtime.     No current facility-administered medications for this encounter.   Allergies:   Amoxicillin, Brilinta [ticagrelor], Erythromycin, Flagyl [metronidazole], Penicillins, Isosorbide mononitrate [isosorbide nitrate], Jardiance [empagliflozin], Metformin and related, and Tape   Social History:  The patient  reports that she has never smoked. She has never used smokeless tobacco. She reports that she does not drink alcohol and does not use drugs.   Family History:  The patient's family history includes AAA (abdominal aortic aneurysm) in her father and mother; Cancer in her sister; Deep vein thrombosis in her father and mother; Dementia in her maternal grandmother and mother; Diabetes in her father, paternal aunt, paternal grandmother, paternal uncle, and paternal uncle; Heart attack in her father; Heart attack (age of onset: 26) in her paternal grandfather; Heart disease in her father and paternal uncle; Hyperlipidemia in her father; Hypertension in her father, mother, and sister; Stroke in her maternal grandmother, paternal grandmother, and paternal uncle.   ROS:  Please see the history of present illness.   All other systems are personally reviewed and negative.   Recent Labs:  08/26/2022: B Natriuretic Peptide 67.0; BUN 18; Creatinine, Ser 0.95; Hemoglobin 15.0; Platelets 176; Potassium 4.9; Sodium 141  Personally reviewed   Wt Readings from Last 3 Encounters:  10/01/22 105.1 kg (231 lb 9.6 oz)  08/26/22 103.7 kg (228 lb 9.6 oz)  04/14/22 99.9 kg (220 lb 3.2 oz)    BP (!) 152/82   Pulse (!) 50   Wt 105.1 kg (231 lb 9.6 oz)   SpO2 96%   BMI 43.76 kg/m   Physical Exam:   General:  NAD. No resp difficulty, walked into clinic with rolling walker. HEENT: Normal Neck:  Supple. No JVD. Carotids 2+ bilat; no bruits. No lymphadenopathy or thryomegaly appreciated. Cor: PMI nondisplaced. Regular rate & rhythm. No rubs, gallops, 2/6 SEM RUSB Lungs: Clear Abdomen: Soft, obese, nontender, nondistended. No hepatosplenomegaly. No bruits or masses. Good bowel sounds. Extremities: No cyanosis, clubbing, rash, edema Neuro: Alert & oriented x 3, cranial nerves grossly intact. Moves all 4 extremities w/o difficulty. Affect pleasant.  Assessment & Plan: 1. CAD: Occluded native RCA and SVG-RCA.  Status post opening of CTO RCA with 4 overlapping Xience DES in 5/15.  DES to sequential SVG-OM1/PLOM in 9/19. LHC in 3/23 showed severe native coronary disease with 90% in-stent restenosis in the proximal RCA (patient has 2 layers of stent at this site). The sequential SVG-OM1/PLOM was patent, SVG-D was patent, and LIMA-LAD was patent. She underwent PTCA of pRCA. She has chronic angina, however exertional dyspnea has been more prominent. Recent echo showed EF further reduced, now 30-35%. Recommend  LHC to evaluate for worsening ischemic disease. Discussed with patient, and Dr. Shirlee Latch. Will arrange soon. Informed Consent   Shared Decision Making/Informed Consent The risks [stroke (1 in 1000), death (1 in 1000), kidney failure [usually temporary] (1 in 500), bleeding (1 in 200), allergic reaction [possibly serious] (1 in 200)], benefits (diagnostic support and management of coronary artery disease) and alternatives of a cardiac catheterization were discussed in detail with Ms. Rake and she is willing to proceed.       - Continue apixaban and Plavix (has been on chronic Plavix, neuro has wanted her to stay on this for cerebrovascular disease).   - Continue ranolazine 1000 mg bid.  - Off Imdur with headache. - Off Toprol XL with bradycardia.   - Cardiac PET to assess for ischemia was previously arranged, will cancel this and move forward with LHC. 2. Chronic systolic => diastolic CHF: Echo  in 1/19 with EF 55-60%, moderate diastolic dysfunction. Cardiomems placed 01/16/18.  Echo in 3/20 in setting of severe PNA with intubation showed EF 25-30%, mid-apical LV severe hypokinesis.  Possible stress (Takotsubo-type) cardiomyopathy related to severe medical illness versus progression of CAD.  She has baseline chronic LBBB which could play a role in cardiomyopathy as well (LBBB cardiomyopathy).  Repeat echo in 11/20 with EF up to 45-50%, moderately dysfunctional RV.  TEE in 12/20 with EF 50-55% with septal-lateral dyssynchrony and mildly dysfunctional RV.  Echo in 11/22 with EF 55% with normal RV.  cMRI in 1/23 with EF 48% and evidence for prior inferior MI.  Echo 7/24 30-35%, G1DD, normal RV.  Ongoing NYHA class III-IIIb symptoms.  She does not appear volume overloaded on exam and Cardiomems does not suggest volume overload, however she has recently required short increase in her loop diuretic due to elevated Cardiomems readings last week. - Continue spiro 25 mg daily. BMET/BNP today - Continue torsemide 10 mg daily. - Continue Entresto 97/103 mg bid.  - Cannot  tolerate SGLT2i. - Not on beta blocker due to bradycardia.  - Recommend RHC at time of LHC to evaluate hemodynamics. She is agreeable. Will arrange with Dr. Shirlee Latch. - With significant reduction in her EF, her LBBB is wide and she may benefit from CRT.  3. Hyperlipidemia: She remains on Zetia, Crestor and Leqvio.  - Followed in lipid clinic. LDL 83 (7/24) 4. Hypertension: BP mildly elevated today, but generally well-controlled on current regimen. She has had orthostatic symptoms and has fallen in the past.  - Continue amlodipine 5 mg daily.  5. Cerebrovascular disease: She has history of CVA and had left carotid stent in 2/15. Followed by [facility].  Last MRA head showed severe stenosis of supraclinoid RICA and severe basilar artery stenosis.  - Neurology would like her to continue Plavix for this despite concomitant apixaban use.  6. OHS/OSA:  Cannot tolerate CPAP.  Uses oxygen at night.  7. Atrial fibrillation: Paroxysmal. 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 No recent palpitations, NSR today.  - Continue apixaban 5 mg bid. CBC today. 8. Obesity: Body mass index is 43.76 kg/m. - She is now on Ozempic. 9. Mitral regurgitation: Moderate on 11/22 echo.  - Mild to moderate on 7/24 echo.  10. Bradycardia: Now off Toprol XL.   11. Aortic stenosis: Mild on 11/22 echo.  - Arranging for repeat echo as above.  12. Mitral annular mass: This is a well-circumscribed calcified posterior mitral annulus mass noted on 5/22 and 11/22 echoes.  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  Follow up with APP 2 weeks after cath.  Signed, Jacklynn Ganong, FNP  10/01/2022  [facility] [address] [facility] [place] [address] [phone] (office) [phone] (fax)

## 2022-10-01 NOTE — Telephone Encounter (Signed)
Reaching out to patient to offer assistance regarding upcoming cardiac imaging study; pt verbalizes understanding of appt date/time, parking situation and where to check in, pre-test NPO status  and verified current allergies; name and call back number provided for further questions should they arise  Merle Prescott RN Navigator Cardiac Imaging [facility] [phone] office [phone] cell  Patient aware to avoid caffeine 12 hours prior to her cardiac PET scan. 

## 2022-10-05 ENCOUNTER — Encounter (HOSPITAL_COMMUNITY): Admission: RE | Admit: 2022-10-05 | Payer: Medicare Other | Source: Ambulatory Visit

## 2022-10-08 ENCOUNTER — Encounter (HOSPITAL_COMMUNITY): Payer: Medicare Other

## 2022-10-11 ENCOUNTER — Ambulatory Visit (HOSPITAL_COMMUNITY)
Admission: RE | Admit: 2022-10-11 | Discharge: 2022-10-11 | Disposition: A | Discharge status: Home / Self Care | Payer: Medicare Other | Source: Ambulatory Visit | Attending: Internal Medicine | Admitting: Internal Medicine

## 2022-10-11 DIAGNOSIS — I5042 Chronic combined systolic (congestive) and diastolic (congestive) heart failure: Secondary | ICD-10-CM | POA: Diagnosis present

## 2022-10-11 LAB — BASIC METABOLIC PANEL
Anion gap: 7 (ref 5–15)
BUN: 22 mg/dL (ref 8–23)
CO2: 29 mmol/L (ref 22–32)
Calcium: 9.3 mg/dL (ref 8.9–10.3)
Chloride: 106 mmol/L (ref 98–111)
Creatinine, Ser: 1.04 mg/dL — ABNORMAL HIGH (ref 0.44–1.00)
GFR, Estimated: 56 mL/min — ABNORMAL LOW (ref >60)
Glucose, Bld: 113 mg/dL — ABNORMAL HIGH (ref 70–99)
Potassium: 4.2 mmol/L (ref 3.5–5.1)
Sodium: 142 mmol/L (ref 135–145)

## 2022-10-21 ENCOUNTER — Telehealth (HOSPITAL_COMMUNITY): Payer: Self-pay

## 2022-10-21 NOTE — Telephone Encounter (Signed)
Patient aware of time and place for procedure scheduled for tomorrow. Aware of nothing to eat or drink after midnight.Is holding Eliquis as ordered.Aware of taking 9 units of insulin(1/2 dose)tonight and no insulin in the am. Holding spironolactone and lasix. Has transportation to and from procedure

## 2022-10-22 ENCOUNTER — Inpatient Hospital Stay (HOSPITAL_COMMUNITY)
Admission: AD | Admit: 2022-10-22 | Discharge: 2022-10-30 | DRG: 286 | Disposition: A | Discharge status: Home Health | Payer: Medicare Other | Attending: Internal Medicine | Admitting: Internal Medicine

## 2022-10-22 ENCOUNTER — Encounter (HOSPITAL_COMMUNITY): Payer: Self-pay | Admitting: Cardiology

## 2022-10-22 ENCOUNTER — Other Ambulatory Visit: Payer: Self-pay

## 2022-10-22 ENCOUNTER — Inpatient Hospital Stay (HOSPITAL_COMMUNITY)
Admission: AD | Disposition: A | Discharge status: Home Health | Payer: Self-pay | Source: Home / Self Care | Attending: Cardiology

## 2022-10-22 DIAGNOSIS — I2582 Chronic total occlusion of coronary artery: Secondary | ICD-10-CM | POA: Diagnosis present

## 2022-10-22 DIAGNOSIS — Z823 Family history of stroke: Secondary | ICD-10-CM

## 2022-10-22 DIAGNOSIS — I25118 Atherosclerotic heart disease of native coronary artery with other forms of angina pectoris: Principal | ICD-10-CM

## 2022-10-22 DIAGNOSIS — N189 Chronic kidney disease, unspecified: Secondary | ICD-10-CM | POA: Diagnosis present

## 2022-10-22 DIAGNOSIS — I25119 Atherosclerotic heart disease of native coronary artery with unspecified angina pectoris: Secondary | ICD-10-CM | POA: Diagnosis present

## 2022-10-22 DIAGNOSIS — I2721 Secondary pulmonary arterial hypertension: Secondary | ICD-10-CM | POA: Diagnosis present

## 2022-10-22 DIAGNOSIS — I5042 Chronic combined systolic (congestive) and diastolic (congestive) heart failure: Secondary | ICD-10-CM | POA: Diagnosis present

## 2022-10-22 DIAGNOSIS — R001 Bradycardia, unspecified: Secondary | ICD-10-CM | POA: Diagnosis not present

## 2022-10-22 DIAGNOSIS — Z85828 Personal history of other malignant neoplasm of skin: Secondary | ICD-10-CM

## 2022-10-22 DIAGNOSIS — R569 Unspecified convulsions: Secondary | ICD-10-CM | POA: Diagnosis present

## 2022-10-22 DIAGNOSIS — F419 Anxiety disorder, unspecified: Secondary | ICD-10-CM | POA: Diagnosis present

## 2022-10-22 DIAGNOSIS — E1142 Type 2 diabetes mellitus with diabetic polyneuropathy: Secondary | ICD-10-CM | POA: Diagnosis present

## 2022-10-22 DIAGNOSIS — Z9981 Dependence on supplemental oxygen: Secondary | ICD-10-CM

## 2022-10-22 DIAGNOSIS — F32A Depression, unspecified: Secondary | ICD-10-CM | POA: Diagnosis present

## 2022-10-22 DIAGNOSIS — Z8673 Personal history of transient ischemic attack (TIA), and cerebral infarction without residual deficits: Secondary | ICD-10-CM | POA: Diagnosis not present

## 2022-10-22 DIAGNOSIS — Z6841 Body Mass Index (BMI) 40.0 and over, adult: Secondary | ICD-10-CM

## 2022-10-22 DIAGNOSIS — T82855A Stenosis of coronary artery stent, initial encounter: Principal | ICD-10-CM | POA: Diagnosis present

## 2022-10-22 DIAGNOSIS — E1151 Type 2 diabetes mellitus with diabetic peripheral angiopathy without gangrene: Secondary | ICD-10-CM | POA: Diagnosis present

## 2022-10-22 DIAGNOSIS — Z7985 Long-term (current) use of injectable non-insulin antidiabetic drugs: Secondary | ICD-10-CM

## 2022-10-22 DIAGNOSIS — Y839 Surgical procedure, unspecified as the cause of abnormal reaction of the patient, or of later complication, without mention of misadventure at the time of the procedure: Secondary | ICD-10-CM | POA: Diagnosis not present

## 2022-10-22 DIAGNOSIS — I959 Hypotension, unspecified: Secondary | ICD-10-CM | POA: Diagnosis present

## 2022-10-22 DIAGNOSIS — I080 Rheumatic disorders of both mitral and aortic valves: Secondary | ICD-10-CM | POA: Diagnosis present

## 2022-10-22 DIAGNOSIS — Z880 Allergy status to penicillin: Secondary | ICD-10-CM

## 2022-10-22 DIAGNOSIS — E785 Hyperlipidemia, unspecified: Secondary | ICD-10-CM | POA: Diagnosis present

## 2022-10-22 DIAGNOSIS — I480 Paroxysmal atrial fibrillation: Secondary | ICD-10-CM | POA: Diagnosis present

## 2022-10-22 DIAGNOSIS — I255 Ischemic cardiomyopathy: Secondary | ICD-10-CM | POA: Diagnosis present

## 2022-10-22 DIAGNOSIS — L7632 Postprocedural hematoma of skin and subcutaneous tissue following other procedure: Secondary | ICD-10-CM | POA: Diagnosis not present

## 2022-10-22 DIAGNOSIS — Z79899 Other long term (current) drug therapy: Secondary | ICD-10-CM

## 2022-10-22 DIAGNOSIS — Z95828 Presence of other vascular implants and grafts: Secondary | ICD-10-CM | POA: Diagnosis not present

## 2022-10-22 DIAGNOSIS — K449 Diaphragmatic hernia without obstruction or gangrene: Secondary | ICD-10-CM | POA: Diagnosis present

## 2022-10-22 DIAGNOSIS — I25110 Atherosclerotic heart disease of native coronary artery with unstable angina pectoris: Secondary | ICD-10-CM | POA: Diagnosis present

## 2022-10-22 DIAGNOSIS — I25700 Atherosclerosis of coronary artery bypass graft(s), unspecified, with unstable angina pectoris: Secondary | ICD-10-CM | POA: Diagnosis present

## 2022-10-22 DIAGNOSIS — Z888 Allergy status to other drugs, medicaments and biological substances status: Secondary | ICD-10-CM

## 2022-10-22 DIAGNOSIS — N179 Acute kidney failure, unspecified: Secondary | ICD-10-CM | POA: Diagnosis not present

## 2022-10-22 DIAGNOSIS — I200 Unstable angina: Secondary | ICD-10-CM | POA: Diagnosis present

## 2022-10-22 DIAGNOSIS — I130 Hypertensive heart and chronic kidney disease with heart failure and stage 1 through stage 4 chronic kidney disease, or unspecified chronic kidney disease: Secondary | ICD-10-CM | POA: Diagnosis present

## 2022-10-22 DIAGNOSIS — I25810 Atherosclerosis of coronary artery bypass graft(s) without angina pectoris: Secondary | ICD-10-CM | POA: Diagnosis not present

## 2022-10-22 DIAGNOSIS — J9621 Acute and chronic respiratory failure with hypoxia: Secondary | ICD-10-CM | POA: Diagnosis present

## 2022-10-22 DIAGNOSIS — I209 Angina pectoris, unspecified: Secondary | ICD-10-CM | POA: Diagnosis present

## 2022-10-22 DIAGNOSIS — G8929 Other chronic pain: Secondary | ICD-10-CM | POA: Diagnosis present

## 2022-10-22 DIAGNOSIS — G4733 Obstructive sleep apnea (adult) (pediatric): Secondary | ICD-10-CM | POA: Diagnosis present

## 2022-10-22 DIAGNOSIS — M549 Dorsalgia, unspecified: Secondary | ICD-10-CM | POA: Diagnosis present

## 2022-10-22 DIAGNOSIS — G4700 Insomnia, unspecified: Secondary | ICD-10-CM | POA: Diagnosis present

## 2022-10-22 DIAGNOSIS — R5381 Other malaise: Secondary | ICD-10-CM | POA: Diagnosis present

## 2022-10-22 DIAGNOSIS — R339 Retention of urine, unspecified: Secondary | ICD-10-CM | POA: Diagnosis present

## 2022-10-22 DIAGNOSIS — Z90710 Acquired absence of both cervix and uterus: Secondary | ICD-10-CM

## 2022-10-22 DIAGNOSIS — Z7902 Long term (current) use of antithrombotics/antiplatelets: Secondary | ICD-10-CM

## 2022-10-22 DIAGNOSIS — I340 Nonrheumatic mitral (valve) insufficiency: Secondary | ICD-10-CM | POA: Diagnosis not present

## 2022-10-22 DIAGNOSIS — Z83438 Family history of other disorder of lipoprotein metabolism and other lipidemia: Secondary | ICD-10-CM

## 2022-10-22 DIAGNOSIS — Y831 Surgical operation with implant of artificial internal device as the cause of abnormal reaction of the patient, or of later complication, without mention of misadventure at the time of the procedure: Secondary | ICD-10-CM | POA: Diagnosis present

## 2022-10-22 DIAGNOSIS — I2510 Atherosclerotic heart disease of native coronary artery without angina pectoris: Secondary | ICD-10-CM | POA: Diagnosis present

## 2022-10-22 DIAGNOSIS — Z9049 Acquired absence of other specified parts of digestive tract: Secondary | ICD-10-CM

## 2022-10-22 DIAGNOSIS — Z85820 Personal history of malignant melanoma of skin: Secondary | ICD-10-CM

## 2022-10-22 DIAGNOSIS — Z794 Long term (current) use of insulin: Secondary | ICD-10-CM | POA: Diagnosis not present

## 2022-10-22 DIAGNOSIS — E1122 Type 2 diabetes mellitus with diabetic chronic kidney disease: Secondary | ICD-10-CM | POA: Diagnosis present

## 2022-10-22 DIAGNOSIS — Z7901 Long term (current) use of anticoagulants: Secondary | ICD-10-CM

## 2022-10-22 DIAGNOSIS — K219 Gastro-esophageal reflux disease without esophagitis: Secondary | ICD-10-CM | POA: Diagnosis present

## 2022-10-22 DIAGNOSIS — Z7951 Long term (current) use of inhaled steroids: Secondary | ICD-10-CM

## 2022-10-22 DIAGNOSIS — I447 Left bundle-branch block, unspecified: Secondary | ICD-10-CM | POA: Diagnosis present

## 2022-10-22 DIAGNOSIS — Z8249 Family history of ischemic heart disease and other diseases of the circulatory system: Secondary | ICD-10-CM

## 2022-10-22 DIAGNOSIS — Z833 Family history of diabetes mellitus: Secondary | ICD-10-CM

## 2022-10-22 HISTORY — PX: RIGHT/LEFT HEART CATH AND CORONARY/GRAFT ANGIOGRAPHY: CATH118267

## 2022-10-22 LAB — COMPREHENSIVE METABOLIC PANEL
ALT: 17 U/L (ref 0–44)
ALT: 20 U/L (ref 0–44)
AST: 18 U/L (ref 15–41)
AST: 19 U/L (ref 15–41)
Albumin: 3.5 g/dL (ref 3.5–5.0)
Albumin: 3.6 g/dL (ref 3.5–5.0)
Alkaline Phosphatase: 47 U/L (ref 38–126)
Alkaline Phosphatase: 50 U/L (ref 38–126)
Anion gap: 9 (ref 5–15)
Anion gap: 10 (ref 5–15)
BUN: 13 mg/dL (ref 8–23)
BUN: 13 mg/dL (ref 8–23)
CO2: 25 mmol/L (ref 22–32)
CO2: 26 mmol/L (ref 22–32)
Calcium: 8.7 mg/dL — ABNORMAL LOW (ref 8.9–10.3)
Calcium: 9 mg/dL (ref 8.9–10.3)
Chloride: 107 mmol/L (ref 98–111)
Chloride: 105 mmol/L (ref 98–111)
Creatinine, Ser: 0.99 mg/dL (ref 0.44–1.00)
Creatinine, Ser: 0.83 mg/dL (ref 0.44–1.00)
GFR, Estimated: 60 mL/min — ABNORMAL LOW (ref >60)
GFR, Estimated: >60 mL/min (ref >60)
Glucose, Bld: 112 mg/dL — ABNORMAL HIGH (ref 70–99)
Glucose, Bld: 118 mg/dL — ABNORMAL HIGH (ref 70–99)
Potassium: 3.5 mmol/L (ref 3.5–5.1)
Potassium: 3.4 mmol/L — ABNORMAL LOW (ref 3.5–5.1)
Sodium: 141 mmol/L (ref 135–145)
Sodium: 141 mmol/L (ref 135–145)
Total Bilirubin: 1.1 mg/dL (ref 0.3–1.2)
Total Bilirubin: 1.1 mg/dL (ref 0.3–1.2)
Total Protein: 5.9 g/dL — ABNORMAL LOW (ref 6.5–8.1)
Total Protein: 6 g/dL — ABNORMAL LOW (ref 6.5–8.1)

## 2022-10-22 LAB — CBC WITH DIFFERENTIAL/PLATELET
Abs Immature Granulocytes: 0.01 10*3/uL (ref 0.00–0.07)
Basophils Absolute: 0 10*3/uL (ref 0.0–0.1)
Basophils Relative: 1 %
Eosinophils Absolute: 0.1 10*3/uL (ref 0.0–0.5)
Eosinophils Relative: 2 %
HCT: 45.7 % (ref 36.0–46.0)
Hemoglobin: 15.4 g/dL — ABNORMAL HIGH (ref 12.0–15.0)
Immature Granulocytes: 0 %
Lymphocytes Relative: 22 %
Lymphs Abs: 1.4 10*3/uL (ref 0.7–4.0)
MCH: 31.8 pg (ref 26.0–34.0)
MCHC: 33.7 g/dL (ref 30.0–36.0)
MCV: 94.4 fL (ref 80.0–100.0)
Monocytes Absolute: 0.4 10*3/uL (ref 0.1–1.0)
Monocytes Relative: 7 %
Neutro Abs: 4.3 10*3/uL (ref 1.7–7.7)
Neutrophils Relative %: 68 %
Platelets: 143 10*3/uL — ABNORMAL LOW (ref 150–400)
RBC: 4.84 MIL/uL (ref 3.87–5.11)
RDW: 13.3 % (ref 11.5–15.5)
WBC: 6.3 10*3/uL (ref 4.0–10.5)
nRBC: 0 % (ref 0.0–0.2)

## 2022-10-22 LAB — POCT I-STAT EG7
Acid-Base Excess: 2 mmol/L (ref 0.0–2.0)
Acid-Base Excess: 3 mmol/L — ABNORMAL HIGH (ref 0.0–2.0)
Bicarbonate: 28.2 mmol/L — ABNORMAL HIGH (ref 20.0–28.0)
Bicarbonate: 28.7 mmol/L — ABNORMAL HIGH (ref 20.0–28.0)
Calcium, Ion: 1.21 mmol/L (ref 1.15–1.40)
Calcium, Ion: 1.25 mmol/L (ref 1.15–1.40)
HCT: 39 % (ref 36.0–46.0)
HCT: 40 % (ref 36.0–46.0)
Hemoglobin: 13.3 g/dL (ref 12.0–15.0)
Hemoglobin: 13.6 g/dL (ref 12.0–15.0)
O2 Saturation: 64 %
O2 Saturation: 68 %
Potassium: 3.8 mmol/L (ref 3.5–5.1)
Potassium: 3.9 mmol/L (ref 3.5–5.1)
Sodium: 144 mmol/L (ref 135–145)
Sodium: 145 mmol/L (ref 135–145)
TCO2: 30 mmol/L (ref 22–32)
TCO2: 30 mmol/L (ref 22–32)
pCO2, Ven: 48.1 mmHg (ref 44–60)
pCO2, Ven: 49.2 mmHg (ref 44–60)
pH, Ven: 7.374 (ref 7.25–7.43)
pH, Ven: 7.376 (ref 7.25–7.43)
pO2, Ven: 35 mmHg (ref 32–45)
pO2, Ven: 36 mmHg (ref 32–45)

## 2022-10-22 LAB — HEMOGLOBIN A1C
Hgb A1c MFr Bld: 6.1 % — ABNORMAL HIGH (ref 4.8–5.6)
Mean Plasma Glucose: 128.37 mg/dL

## 2022-10-22 LAB — CBC
HCT: 42.8 % (ref 36.0–46.0)
Hemoglobin: 14.4 g/dL (ref 12.0–15.0)
MCH: 31.4 pg (ref 26.0–34.0)
MCHC: 33.6 g/dL (ref 30.0–36.0)
MCV: 93.4 fL (ref 80.0–100.0)
Platelets: 139 10*3/uL — ABNORMAL LOW (ref 150–400)
RBC: 4.58 MIL/uL (ref 3.87–5.11)
RDW: 13.2 % (ref 11.5–15.5)
WBC: 6 10*3/uL (ref 4.0–10.5)
nRBC: 0 % (ref 0.0–0.2)

## 2022-10-22 LAB — BRAIN NATRIURETIC PEPTIDE
B Natriuretic Peptide: 126.2 pg/mL — ABNORMAL HIGH (ref 0.0–100.0)
B Natriuretic Peptide: 133.9 pg/mL — ABNORMAL HIGH (ref 0.0–100.0)

## 2022-10-22 LAB — MAGNESIUM: Magnesium: 2.1 mg/dL (ref 1.7–2.4)

## 2022-10-22 LAB — TSH: TSH: 0.75 u[IU]/mL (ref 0.350–4.500)

## 2022-10-22 LAB — GLUCOSE, CAPILLARY
Glucose-Capillary: 128 mg/dL — ABNORMAL HIGH (ref 70–99)
Glucose-Capillary: 108 mg/dL — ABNORMAL HIGH (ref 70–99)
Glucose-Capillary: 125 mg/dL — ABNORMAL HIGH (ref 70–99)

## 2022-10-22 LAB — TROPONIN I (HIGH SENSITIVITY)
Troponin I (High Sensitivity): 15 ng/L (ref <18)
Troponin I (High Sensitivity): 15 ng/L (ref <18)

## 2022-10-22 SURGERY — RIGHT/LEFT HEART CATH AND CORONARY/GRAFT ANGIOGRAPHY
Anesthesia: LOCAL

## 2022-10-22 MED ORDER — SODIUM CHLORIDE 0.9% FLUSH
3.0000 mL | Freq: Two times a day (BID) | INTRAVENOUS | Status: DC
  Administered 2022-10-23 – 2022-10-24 (×2): 3 mL via INTRAVENOUS

## 2022-10-22 MED ORDER — INSULIN ASPART 100 UNIT/ML IJ SOLN: 0.0000 [IU] | Freq: Every day | INTRAMUSCULAR | Status: DC

## 2022-10-22 MED ORDER — ALBUTEROL SULFATE (2.5 MG/3ML) 0.083% IN NEBU: 3.0000 mL | INHALATION_SOLUTION | RESPIRATORY_TRACT | Status: DC | PRN

## 2022-10-22 MED ORDER — SODIUM CHLORIDE 0.9 % IV SOLN: 250.0000 mL | INTRAVENOUS | Status: DC | PRN

## 2022-10-22 MED ORDER — TORSEMIDE 20 MG PO TABS
10.0000 mg | ORAL_TABLET | Freq: Every day | ORAL | Status: DC
  Filled 2022-10-22: qty 1

## 2022-10-22 MED ORDER — SODIUM CHLORIDE 0.9% FLUSH
3.0000 mL | Freq: Two times a day (BID) | INTRAVENOUS | Status: DC
  Administered 2022-10-23 – 2022-10-30 (×12): 3 mL via INTRAVENOUS

## 2022-10-22 MED ORDER — HYDRALAZINE HCL 20 MG/ML IJ SOLN
INTRAMUSCULAR | Status: AC
  Filled 2022-10-22: qty 1

## 2022-10-22 MED ORDER — GABAPENTIN 600 MG PO TABS: 600.0000 mg | ORAL_TABLET | ORAL | Status: DC

## 2022-10-22 MED ORDER — ENOXAPARIN SODIUM 40 MG/0.4ML IJ SOSY: 40.0000 mg | PREFILLED_SYRINGE | INTRAMUSCULAR | Status: DC

## 2022-10-22 MED ORDER — VERAPAMIL HCL 2.5 MG/ML IV SOLN
INTRAVENOUS | Status: AC
  Filled 2022-10-22: qty 2

## 2022-10-22 MED ORDER — SPIRONOLACTONE 25 MG PO TABS
25.0000 mg | ORAL_TABLET | Freq: Every day | ORAL | Status: DC
  Administered 2022-10-22: 25 mg via ORAL
  Filled 2022-10-22 (×2): qty 1

## 2022-10-22 MED ORDER — SODIUM CHLORIDE 0.9 % IV SOLN: INTRAVENOUS | Status: DC

## 2022-10-22 MED ORDER — MIDAZOLAM HCL 2 MG/2ML IJ SOLN
INTRAMUSCULAR | Status: DC | PRN
  Administered 2022-10-22: 1 mg via INTRAVENOUS

## 2022-10-22 MED ORDER — INSULIN ASPART 100 UNIT/ML IJ SOLN
0.0000 [IU] | Freq: Three times a day (TID) | INTRAMUSCULAR | Status: DC
  Administered 2022-10-23 – 2022-10-24 (×3): 2 [IU] via SUBCUTANEOUS
  Administered 2022-10-24: 3 [IU] via SUBCUTANEOUS
  Administered 2022-10-26: 2 [IU] via SUBCUTANEOUS
  Administered 2022-10-27: 3 [IU] via SUBCUTANEOUS
  Administered 2022-10-27 (×2): 2 [IU] via SUBCUTANEOUS
  Administered 2022-10-28: 3 [IU] via SUBCUTANEOUS
  Administered 2022-10-28: 2 [IU] via SUBCUTANEOUS
  Administered 2022-10-28: 3 [IU] via SUBCUTANEOUS
  Administered 2022-10-29: 2 [IU] via SUBCUTANEOUS
  Administered 2022-10-29: 3 [IU] via SUBCUTANEOUS
  Administered 2022-10-29 – 2022-10-30 (×2): 2 [IU] via SUBCUTANEOUS
  Administered 2022-10-30: 3 [IU] via SUBCUTANEOUS

## 2022-10-22 MED ORDER — ACETAMINOPHEN 500 MG PO TABS: 500.0000 mg | ORAL_TABLET | Freq: Four times a day (QID) | ORAL | Status: DC | PRN

## 2022-10-22 MED ORDER — DULOXETINE HCL 30 MG PO CPEP
30.0000 mg | ORAL_CAPSULE | Freq: Every day | ORAL | Status: DC
  Administered 2022-10-22 – 2022-10-30 (×9): 30 mg via ORAL
  Filled 2022-10-22 (×9): qty 1

## 2022-10-22 MED ORDER — ACETAMINOPHEN 325 MG PO TABS: 650.0000 mg | ORAL_TABLET | ORAL | Status: DC | PRN

## 2022-10-22 MED ORDER — MIDAZOLAM HCL 2 MG/2ML IJ SOLN
INTRAMUSCULAR | Status: AC
  Filled 2022-10-22: qty 2

## 2022-10-22 MED ORDER — ASPIRIN 81 MG PO CHEW: 81.0000 mg | CHEWABLE_TABLET | Freq: Once | ORAL | Status: DC

## 2022-10-22 MED ORDER — HYDRALAZINE HCL 20 MG/ML IJ SOLN: 10.0000 mg | INTRAMUSCULAR | Status: AC | PRN

## 2022-10-22 MED ORDER — HEPARIN (PORCINE) IN NACL 1000-0.9 UT/500ML-% IV SOLN
INTRAVENOUS | Status: DC | PRN
  Administered 2022-10-22 (×2): 500 mL

## 2022-10-22 MED ORDER — LABETALOL HCL 5 MG/ML IV SOLN
INTRAVENOUS | Status: AC
  Filled 2022-10-22: qty 4

## 2022-10-22 MED ORDER — OXYCODONE HCL 5 MG PO TABS
5.0000 mg | ORAL_TABLET | Freq: Four times a day (QID) | ORAL | Status: DC | PRN
  Administered 2022-10-22 – 2022-10-28 (×6): 5 mg via ORAL
  Filled 2022-10-22 (×6): qty 1

## 2022-10-22 MED ORDER — NITROGLYCERIN 0.3 MG SL SUBL: 0.3000 mg | SUBLINGUAL_TABLET | SUBLINGUAL | Status: DC | PRN

## 2022-10-22 MED ORDER — ASPIRIN 81 MG PO CHEW
CHEWABLE_TABLET | ORAL | Status: AC
  Filled 2022-10-22: qty 1

## 2022-10-22 MED ORDER — HYDRALAZINE HCL 20 MG/ML IJ SOLN
INTRAMUSCULAR | Status: DC | PRN
  Administered 2022-10-22: 10 mg via INTRAVENOUS

## 2022-10-22 MED ORDER — FUROSEMIDE 10 MG/ML IJ SOLN
40.0000 mg | Freq: Once | INTRAMUSCULAR | Status: AC
  Administered 2022-10-22: 40 mg via INTRAVENOUS
  Filled 2022-10-22: qty 4

## 2022-10-22 MED ORDER — IOHEXOL 350 MG/ML SOLN
INTRAVENOUS | Status: DC | PRN
  Administered 2022-10-22: 100 mL

## 2022-10-22 MED ORDER — NITROGLYCERIN 0.4 MG SL SUBL: 0.4000 mg | SUBLINGUAL_TABLET | SUBLINGUAL | Status: DC | PRN

## 2022-10-22 MED ORDER — ONDANSETRON 4 MG PO TBDP
4.0000 mg | ORAL_TABLET | Freq: Two times a day (BID) | ORAL | Status: DC
  Administered 2022-10-22 – 2022-10-30 (×16): 4 mg via ORAL
  Filled 2022-10-22 (×18): qty 1

## 2022-10-22 MED ORDER — SACUBITRIL-VALSARTAN 97-103 MG PO TABS
1.0000 | ORAL_TABLET | Freq: Two times a day (BID) | ORAL | Status: DC
  Administered 2022-10-22: 1 via ORAL
  Filled 2022-10-22 (×3): qty 1

## 2022-10-22 MED ORDER — EZETIMIBE 10 MG PO TABS
10.0000 mg | ORAL_TABLET | Freq: Every day | ORAL | Status: DC
  Administered 2022-10-22 – 2022-10-29 (×8): 10 mg via ORAL
  Filled 2022-10-22 (×8): qty 1

## 2022-10-22 MED ORDER — SODIUM CHLORIDE 0.9% FLUSH: 3.0000 mL | INTRAVENOUS | Status: DC | PRN

## 2022-10-22 MED ORDER — ASPIRIN 81 MG PO CHEW
81.0000 mg | CHEWABLE_TABLET | Freq: Once | ORAL | Status: AC
  Administered 2022-10-22: 81 mg via ORAL

## 2022-10-22 MED ORDER — FENTANYL CITRATE (PF) 100 MCG/2ML IJ SOLN
INTRAMUSCULAR | Status: AC
  Filled 2022-10-22: qty 2

## 2022-10-22 MED ORDER — LIDOCAINE HCL (PF) 1 % IJ SOLN
INTRAMUSCULAR | Status: AC
  Filled 2022-10-22: qty 30

## 2022-10-22 MED ORDER — NITROGLYCERIN 0.4 MG SL SUBL
SUBLINGUAL_TABLET | SUBLINGUAL | Status: AC
  Filled 2022-10-22: qty 1

## 2022-10-22 MED ORDER — FENTANYL CITRATE (PF) 100 MCG/2ML IJ SOLN
INTRAMUSCULAR | Status: DC | PRN
  Administered 2022-10-22: 25 ug via INTRAVENOUS

## 2022-10-22 MED ORDER — BUPROPION HCL ER (XL) 150 MG PO TB24
300.0000 mg | ORAL_TABLET | Freq: Every day | ORAL | Status: DC
  Administered 2022-10-22 – 2022-10-30 (×9): 300 mg via ORAL
  Filled 2022-10-22 (×9): qty 2

## 2022-10-22 MED ORDER — VITAMIN B-12 1000 MCG PO TABS
1000.0000 ug | ORAL_TABLET | Freq: Every morning | ORAL | Status: DC
  Administered 2022-10-23 – 2022-10-30 (×8): 1000 ug via ORAL
  Filled 2022-10-22 (×8): qty 1

## 2022-10-22 MED ORDER — ALPRAZOLAM 0.5 MG PO TABS: 0.5000 mg | ORAL_TABLET | ORAL | Status: DC

## 2022-10-22 MED ORDER — RANOLAZINE ER 500 MG PO TB12
1000.0000 mg | ORAL_TABLET | Freq: Two times a day (BID) | ORAL | Status: DC
  Administered 2022-10-22 – 2022-10-24 (×4): 1000 mg via ORAL
  Filled 2022-10-22 (×6): qty 2

## 2022-10-22 MED ORDER — BISACODYL 5 MG PO TBEC: 5.0000 mg | DELAYED_RELEASE_TABLET | Freq: Every day | ORAL | Status: DC | PRN

## 2022-10-22 MED ORDER — AMLODIPINE BESYLATE 5 MG PO TABS
5.0000 mg | ORAL_TABLET | Freq: Every day | ORAL | Status: DC
  Administered 2022-10-22: 5 mg via ORAL
  Filled 2022-10-22: qty 1

## 2022-10-22 MED ORDER — GABAPENTIN 300 MG PO CAPS
600.0000 mg | ORAL_CAPSULE | Freq: Every morning | ORAL | Status: DC
  Administered 2022-10-23 – 2022-10-30 (×8): 600 mg via ORAL
  Filled 2022-10-22 (×8): qty 2

## 2022-10-22 MED ORDER — LOPERAMIDE HCL 2 MG PO CAPS: 2.0000 mg | ORAL_CAPSULE | Freq: Four times a day (QID) | ORAL | Status: DC | PRN

## 2022-10-22 MED ORDER — PANTOPRAZOLE SODIUM 40 MG PO TBEC
40.0000 mg | DELAYED_RELEASE_TABLET | Freq: Every day | ORAL | Status: DC
  Administered 2022-10-23 – 2022-10-30 (×8): 40 mg via ORAL
  Filled 2022-10-22 (×8): qty 1

## 2022-10-22 MED ORDER — LABETALOL HCL 5 MG/ML IV SOLN
10.0000 mg | INTRAVENOUS | Status: AC | PRN
  Administered 2022-10-22: 10 mg via INTRAVENOUS

## 2022-10-22 MED ORDER — TRAZODONE HCL 50 MG PO TABS
100.0000 mg | ORAL_TABLET | Freq: Every day | ORAL | Status: DC
  Administered 2022-10-22 – 2022-10-29 (×8): 100 mg via ORAL
  Filled 2022-10-22: qty 2
  Filled 2022-10-22 (×2): qty 1
  Filled 2022-10-22 (×5): qty 2

## 2022-10-22 MED ORDER — LIDOCAINE HCL (PF) 1 % IJ SOLN
INTRAMUSCULAR | Status: DC | PRN
  Administered 2022-10-22: 10 mL via INTRADERMAL
  Administered 2022-10-22: 2 mL via INTRADERMAL

## 2022-10-22 MED ORDER — ONDANSETRON HCL 4 MG/2ML IJ SOLN
4.0000 mg | Freq: Four times a day (QID) | INTRAMUSCULAR | Status: DC | PRN
  Administered 2022-10-24: 4 mg via INTRAVENOUS
  Filled 2022-10-22: qty 2

## 2022-10-22 MED ORDER — MOMETASONE FURO-FORMOTEROL FUM 200-5 MCG/ACT IN AERO
2.0000 | INHALATION_SPRAY | Freq: Two times a day (BID) | RESPIRATORY_TRACT | Status: DC
  Administered 2022-10-22 – 2022-10-30 (×16): 2 via RESPIRATORY_TRACT
  Filled 2022-10-22: qty 8.8

## 2022-10-22 MED ORDER — INSULIN ASPART 100 UNIT/ML IJ SOLN: 0.0000 [IU] | Freq: Three times a day (TID) | INTRAMUSCULAR | Status: DC

## 2022-10-22 MED ORDER — ROSUVASTATIN CALCIUM 20 MG PO TABS
40.0000 mg | ORAL_TABLET | Freq: Every day | ORAL | Status: DC
  Administered 2022-10-22 – 2022-10-30 (×9): 40 mg via ORAL
  Filled 2022-10-22 (×10): qty 2

## 2022-10-22 MED ORDER — SODIUM CHLORIDE 0.9 % IV SOLN: INTRAVENOUS | Status: AC

## 2022-10-22 MED ORDER — CLOPIDOGREL BISULFATE 75 MG PO TABS
75.0000 mg | ORAL_TABLET | Freq: Every morning | ORAL | Status: DC
  Administered 2022-10-23 – 2022-10-30 (×8): 75 mg via ORAL
  Filled 2022-10-22 (×8): qty 1

## 2022-10-22 MED ORDER — ACETAMINOPHEN 325 MG PO TABS
650.0000 mg | ORAL_TABLET | ORAL | Status: DC | PRN
  Administered 2022-10-22 – 2022-10-26 (×3): 650 mg via ORAL
  Filled 2022-10-22 (×3): qty 2

## 2022-10-22 MED ORDER — ALPRAZOLAM 0.5 MG PO TABS
0.5000 mg | ORAL_TABLET | Freq: Every day | ORAL | Status: DC
  Administered 2022-10-23 – 2022-10-30 (×8): 0.5 mg via ORAL
  Filled 2022-10-22 (×8): qty 1

## 2022-10-22 MED ORDER — GABAPENTIN 400 MG PO CAPS
1200.0000 mg | ORAL_CAPSULE | Freq: Every day | ORAL | Status: DC
  Administered 2022-10-22 – 2022-10-29 (×8): 1200 mg via ORAL
  Filled 2022-10-22 (×8): qty 3

## 2022-10-22 MED ORDER — ALPRAZOLAM 0.5 MG PO TABS
1.0000 mg | ORAL_TABLET | Freq: Every day | ORAL | Status: DC
  Administered 2022-10-22 – 2022-10-29 (×8): 1 mg via ORAL
  Filled 2022-10-22 (×8): qty 2

## 2022-10-22 MED ORDER — ONDANSETRON HCL 4 MG/2ML IJ SOLN
4.0000 mg | Freq: Four times a day (QID) | INTRAMUSCULAR | Status: DC | PRN
  Filled 2022-10-22: qty 2

## 2022-10-22 SURGICAL SUPPLY — 12 items
CATH BALLN WEDGE 5F 110CM (CATHETERS) IMPLANT
CATH INFINITI 5 FR 3DRC (CATHETERS) IMPLANT
CATH INFINITI 5FR MULTPACK ANG (CATHETERS) IMPLANT
CATH INFINITI 6F ANG MULTIPACK (CATHETERS) IMPLANT
KIT MICROPUNCTURE NIT STIFF (SHEATH) IMPLANT
PACK CARDIAC CATHETERIZATION (CUSTOM PROCEDURE TRAY) ×1 IMPLANT
PROTECTION STATION PRESSURIZED (MISCELLANEOUS) ×1
SET ATX-X65L (MISCELLANEOUS) IMPLANT
SHEATH GLIDE SLENDER 4/5FR (SHEATH) IMPLANT
SHEATH PINNACLE 5F 10CM (SHEATH) IMPLANT
STATION PROTECTION PRESSURIZED (MISCELLANEOUS) IMPLANT
WIRE EMERALD 3MM-J .035X150CM (WIRE) IMPLANT

## 2022-10-22 NOTE — H&P (Signed)
Advanced Heart Failure Team History and Physical Note   PCP:  Mosetta Putt, MD  PCP-Cardiology: Marca Ancona, MD  Reason for Admission: Unstable angina   HPI:    [name] [name] is a 77 y.o. female who has a history of CAD s/p CABG, diastolic CHF, and cerebrovascular disease with history of CVA. She had CABG x 5 in 12/11.  Prior to the CABG she had multiple PCIs.  She had a CVA in 2/14 that presented as visual loss.  She had a left carotid stent in 2/15.    Left heart cath done in Sept. 2014. This showed patent SVG-D, LIMA-LAD, and sequential SVG-OM branches but SVG-RCA and the native RCA were both totally occluded. It was felt that her increased symptoms coincided with occlusion of SVG-RCA.  She was started on Imdur to see if this would help with the chest pain and dyspnea.  However, she feels like Imdur causes leg cramps and does not think that she can take it.  So ranolazine 500 mg bid started and titrated this up to 1000 mg bid.  This helped some but not markedly. Therefore, sent to Dr. Eldridge Dace to address opening RCA CTO. He was able to do this in 5/15 with 4 overlapping Xience DES.  This led to resolution of exertional chest pain.     [name] [name] was started on Brilinta after PCI.  She became much more short of breath after starting on Brilinta. Brilinta stopped and replaced with Plavix.  Dyspnea improved off Brilinta. Given increasing exertional dyspnea and chest pain,  RHC/LHC was done in 4/17.  This showed stable CAD with no interventional target.  Left and right heart filling pressures were not significantly increased.  Medical management.    She had an MRI/MRA in May 2018 that showed moderately severe stenosis of the supraclinoid segment of the right internal carotid artery, progressed since the 2016 MR angiogram, and severe basilar artery stenosis.    She developed recurrent exertional chest pain and had LHC in 6/18, showing 4/5 grafts patent with patent native RCA (similar to  prior cath, no changes).      She reported increased dyspnea and chest pain and had RHC/LHC in 9/19.  She had DES to sequential SVG-OM1/PLOM.  While hospitalized, she was noted to have runs of atrial fibrillation.  She was very symptomatic with the atrial fibrillation.  Eliquis and amiodarone were started.    She was admitted 12/16-12/19/19 for cardiomems implant and RHC.    She had a prolonged hospitalization in 2/20 with acute respiratory failure and fever to 103.  She was thought to have PNA and treated with cefepime.  She was intubated.  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  Amiodarone was stopped and she was put on prednisone. Echo in 2/20 showed EF down to 25-30% with mid-apical LV severe hypokinesis. She had AKI. Possible ITP triggered by infection, HIT negative.  ITP was treated with Solumedrol. She was discharged to SNF.     11/20 admission initially with concern for TIAs (staring spells), ended up thinking possible partial seizures.  Head MRI did not show CVA.  Echo in 11/20 showed EF 45-50%, moderately decreased RV systolic function, moderate RV enlargement, moderate-severe MR, moderate TR.    Patient was admitted again in 12/20 with AKI and hypotension in the setting of sinus bradycardia (HR 30s).  She was volume overloaded on exam.  Toprol XL was stopped and HR increased to 60s.  She was  diuresed with IV Lasix.  TEE in 12/20 showed EF 50-55%, septal-lateral dyssynchrony, mildly decreased RV function, moderate MR.     Echo in 5/22 showed EF 60-65%, moderate LVH, normal RV, PASP 37 mmHg, 2.5 x 2.1 cm calcified mass posterior mitral annulus with mild-moderate MR => ?severe MAC but more circumscribed and prominent than in the past, mild AS mean gradient 12 mmHg and AVA 1.83 cm^2.  Echo in 11/22 showed EF 55%, normal RV, mass posterior mitral annulus, moderate MR, mild AS, IVC normal.  Cardiac MRI was done in 1/23 to investigate posterior mitral annulus mass;  study showed LV EF 48%, mild LV dilation, normal RV with EF 53%, extensive MAC is likely the source of the posterior mitral annulus mass, basal-mid inferior >50% subendocardial LGE suggestive of prior inferior MI.    Follow up 2/23 she had chest tightness x 4 weeks. Arranged for [facility] which showed in-stent restenosis of pRCA, felt to be the source of her symptoms given occluded SVG-RCA. Underwent PTCA to pRCA. RHC with normal PCWP and RA pressure, mild pulmonary hypertension, preserved CO. On ASA + Plavix + Eliquis x 1 month (until 05/21/21), then Plavix + Eliquis.    Echo 8/24 showed EF significantly worse, down to 30-35%, G1DD, normal RV.   At office visit in 8/24, was having worsening exertional dyspnea as well as chest tightness.  Symptoms were occurring just walking around the house.  LHC/RHC arranged.  Today, she had chest tightness in short stay requiring NTG and reports frequent dyspnea/chest tightness with less and less exertion at home.  LHC/RHC today showed mildly elevated PCWP with normal RA pressure, mixed pulmonary venous/pulmonary arterial hypertension, and 99% in-stent restenosis in the proximal RCA, at least 2 layers of stent are present. I spoke with Dr. Herbie Baltimore, plan to return to cath lab on Monday for cutting balloon angioplasty to ISR in the RCA.  Will admit over weekend with unstable symptoms.   Review of Systems: All systems reviewed and negative except as per HPI.    Home Medications Prior to Admission medications   Medication Sig Start Date End Date Taking? Authorizing Provider  acetaminophen (TYLENOL) 500 MG tablet Take 500 mg by mouth every 6 (six) hours as needed for mild pain, moderate pain, fever or headache.   Yes [provider]  albuterol (VENTOLIN HFA) 108 (90 Base) MCG/ACT inhaler Inhale 2 puffs into the lungs every 4 (four) hours as needed for wheezing or shortness of breath.    Yes [provider]  ALPRAZolam Prudy Feeler) 0.5 MG tablet Take 0.5-1 mg by  mouth See admin instructions. Take 0.5 mg at lunch and 1 mg at bedtime   Yes [provider]  amLODipine (NORVASC) 5 MG tablet Take 1 tablet (5 mg total) by mouth at bedtime. 01/13/22  Yes Laurey Morale, MD  apixaban (ELIQUIS) 5 MG TABS tablet Take 1 tablet (5 mg total) by mouth 2 (two) times daily. 04/05/22  Yes Laurey Morale, MD  B Complex-C (B-COMPLEX WITH VITAMIN C) tablet Take 1 tablet by mouth in the morning.   Yes [provider]  baclofen (LIORESAL) 10 MG tablet Take 10 mg by mouth at bedtime.   Yes [provider]  bisacodyl (DULCOLAX) 5 MG EC tablet Take 5 mg by mouth daily as needed for moderate constipation.   Yes [provider]  buPROPion (WELLBUTRIN XL) 300 MG 24 hr tablet Take 300 mg by mouth daily.   Yes [provider]  carboxymethylcellulose (REFRESH PLUS)  0.5 % SOLN Place 1 drop into both eyes in the morning, at noon, and at bedtime.   Yes [provider]  Cholecalciferol (VITAMIN D3) 50 MCG (2000 UT) capsule Take 2,000 Units by mouth daily in the afternoon.   Yes [provider]  clopidogrel (PLAVIX) 75 MG tablet Take 75 mg by mouth every morning.   Yes [provider]  DULoxetine (CYMBALTA) 30 MG capsule Take 1 capsule (30 mg total) by mouth daily. 09/28/18  Yes Angiulli, Mcarthur Rossetti, PA-C  ezetimibe (ZETIA) 10 MG tablet Take 1 tablet (10 mg total) by mouth at bedtime. 08/27/22  Yes Laurey Morale, MD  fluocinonide cream (LIDEX) 0.05 % Apply 1 application. topically 2 (two) times daily as needed (irritation). 01/17/21  Yes [provider]  Fluticasone-Salmeterol (ADVAIR) 250-50 MCG/DOSE AEPB Inhale 1 puff into the lungs daily.   Yes [provider]  gabapentin (NEURONTIN) 600 MG tablet Take 600-1,200 mg by mouth See admin instructions. Take 1 tablet (600 mg) by mouth in the morning & take 2 tablets (1200 mg) by mouth at bedtime 07/04/19  Yes [provider]  Insulin Glargine (LANTUS  SOLOSTAR) 100 UNIT/ML Solostar Pen Inject 0-8 Units into the skin at bedtime. Per sliding scale   Yes [provider]  insulin lispro (HUMALOG) 100 UNIT/ML KwikPen Inject 0-6 Units into the skin See admin instructions. Sliding scale Inject 8 units subcutaneously prior to breakfast and supper; add 4 units for CBG >200   Yes [provider]  loperamide (IMODIUM A-D) 2 MG tablet Take 2 mg by mouth 4 (four) times daily as needed for diarrhea or loose stools.   Yes [provider]  Multiple Vitamin (MULTIVITAMIN WITH MINERALS) TABS tablet Take 1 tablet by mouth every morning. Centrum - Women over 50   Yes [provider]  Multiple Vitamins-Minerals (OCUVITE EYE HEALTH FORMULA) CAPS Take 1 capsule by mouth every morning.   Yes [provider]  nitroGLYCERIN (NITROSTAT) 0.3 MG SL tablet DISSOLVE 1 TABLET UNDER TONGUE EVERY 5 MINUTES FOR UP TO 3 DOSES AS NEEDED FOR CHEST PAIN 05/26/22  Yes Milford, Anderson Malta, FNP  ondansetron (ZOFRAN-ODT) 4 MG disintegrating tablet Take 4 mg by mouth 2 (two) times daily.   Yes [provider]  OXYGEN Inhale 2 L into the lungs at bedtime as needed (shortness of breath).    Yes [provider]  pantoprazole (PROTONIX) 40 MG tablet Take 1 tablet (40 mg total) by mouth daily. 09/28/18  Yes Angiulli, Mcarthur Rossetti, PA-C  pyridOXINE (VITAMIN B6) 100 MG tablet Take 100 mg by mouth every morning.   Yes [provider]  ranolazine (RANEXA) 1000 MG SR tablet TAKE ONE TABLET BY MOUTH TWICE DAILY 08/06/20  Yes Laurey Morale, MD  rosuvastatin (CRESTOR) 40 MG tablet Take 1 tablet (40 mg total) by mouth daily. 08/27/22  Yes Laurey Morale, MD  sacubitril-valsartan (ENTRESTO) 97-103 MG Take 1 tablet by mouth 2 (two) times daily. 04/05/22  Yes Laurey Morale, MD  Semaglutide, 2 MG/DOSE, (OZEMPIC, 2 MG/DOSE,) 8 MG/3ML SOPN Inject 2 mg into the skin once a week. 03/15/22  Yes Laurey Morale, MD  spironolactone (ALDACTONE) 25 MG  tablet TAKE 1 TABLET BY MOUTH DAILY 01/15/22  Yes Laurey Morale, MD  torsemide (DEMADEX) 10 MG tablet TAKE ONE (1) TABLET BY MOUTH EVERY DAY 02/23/22  Yes Laurey Morale, MD  traZODone (DESYREL) 50 MG tablet Take 100-150 mg by mouth at bedtime.   Yes  [provider]  vitamin B-12 (CYANOCOBALAMIN) 1000 MCG tablet Take 1,000 mcg by mouth every morning.    Yes [provider]  zolpidem (AMBIEN) 10 MG tablet Take 10 mg by mouth at bedtime.   Yes [provider]  denosumab (PROLIA) 60 MG/ML SOSY injection Inject 60 mg into the skin every 6 (six) months.    [provider]  inclisiran (LEQVIO) 284 MG/1.5ML SOSY injection Inject 284 mg into the skin every 6 (six) months.    [provider]    Past Medical History: Past Medical History:  Diagnosis Date   Anemia    hx   Anxiety    Asthma    Basal cell carcinoma 05/2014   "left shoulder"   Bundle branch block, left    chronic/notes 07/18/2013   CHF (congestive heart failure) (HCC)    Chronic insomnia 05/06/2015   Chronic kidney disease    frequency, sees dr Camille Bal every 4 to 6 months (01/16/2018)   Chronic lower back pain    Claustrophobia    Common migraine 05/14/2014   Coronary artery disease    MI in 2001, 2002, 2006, 2011, 2014   Depression    Diabetic peripheral neuropathy (HCC) 01/12/2019   GERD (gastroesophageal reflux disease)    H/O hiatal hernia    Headache    "at least 2/month" (01/16/2018)   Heart murmur    Hyperlipidemia    Hypertension    Memory change 05/14/2014   Migraine    "5-6/year"  (01/16/2018)   Obesity 01-2010   Obstructive sleep apnea    "can't wear machine; I have claustrophobia" (01/16/2018), states she had a 2nd sleep study and does not have sleep apnea, her O2 decreases and now is on 2 L of O2 at night.   On home oxygen therapy    "2L at night and prn during daytime" (01/16/2018)   Osteoarthritis    "knees and hands" (01/16/2018)   Peripheral  vascular disease (HCC)    ? numbness, tingling arms and legs   PONV (postoperative nausea and vomiting)    Stroke (HCC)    2014, 2015, 2016   Type II diabetes mellitus (HCC)    Ventral hernia    hx of    Past Surgical History: Past Surgical History:  Procedure Laterality Date   ANKLE FRACTURE SURGERY Left 1970's   APPENDECTOMY  1970's   w/hysterectomy   BASAL CELL CARCINOMA EXCISION Left 05/2014   "shoulder" (01/16/2018)   CARDIAC CATHETERIZATION  10/10/2012   Dr Shirlee Latch.   CARDIAC CATHETERIZATION N/A 05/29/2015   Procedure: Right/Left Heart Cath and Coronary/Graft Angiography;  Surgeon: Laurey Morale, MD;  Location: [facility] INVASIVE CV LAB;  Service: Cardiovascular;  Laterality: N/A;   CAROTID ENDARTERECTOMY Left 03/2013   CAROTID STENT INSERTION Left 03/20/2013   Procedure: CAROTID STENT INSERTION;  Surgeon: Nada Libman, MD;  Location: [facility] CATH LAB;  Service: Cardiovascular;  Laterality: Left;  internal carotid   CEREBRAL ANGIOGRAM N/A 04/05/2011   Procedure: CEREBRAL ANGIOGRAM;  Surgeon: Chuck Hint, MD;  Location: [facility] CATH LAB;  Service: Cardiovascular;  Laterality: N/A;   CHOLECYSTECTOMY OPEN  2004   CORONARY ANGIOPLASTY WITH STENT PLACEMENT  01,02,05,06,07,08,11; 04/24/2013   "I've probably got ~ 10 stents by now" (04/24/2013)   CORONARY ANGIOPLASTY WITH STENT PLACEMENT  06/13/2013   "got 4 stents today" (06/13/2013)   CORONARY ARTERY BYPASS GRAFT  1220/11   "CABG X5"   CORONARY BALLOON ANGIOPLASTY N/A 04/20/2021   Procedure: CORONARY  BALLOON ANGIOPLASTY;  Surgeon: Runell Gess, MD;  Location: [facility] INVASIVE CV LAB;  Service: Cardiovascular;  Laterality: N/A;   CORONARY STENT INTERVENTION N/A 10/20/2017   Procedure: CORONARY STENT INTERVENTION;  Surgeon: Lennette Bihari, MD;  Location: [facility] INVASIVE CV LAB;  Service: Cardiovascular;  Laterality: N/A;   CORONARY STENT INTERVENTION N/A 04/20/2021   Procedure: CORONARY STENT INTERVENTION;  Surgeon: Runell Gess, MD;   Location: [facility] INVASIVE CV LAB;  Service: Cardiovascular;  Laterality: N/A;   ESOPHAGOGASTRODUODENOSCOPY  08/03/2011   Procedure: ESOPHAGOGASTRODUODENOSCOPY (EGD);  Surgeon: Kandis Cocking, MD;  Location: [facility] ENDOSCOPY;  Service: General;  Laterality: N/A;   ESOPHAGOGASTRODUODENOSCOPY (EGD) WITH PROPOFOL N/A 03/11/2014   Procedure: ESOPHAGOGASTRODUODENOSCOPY (EGD) WITH PROPOFOL;  Surgeon: Hart Carwin, MD;  Location: [facility] ENDOSCOPY;  Service: Endoscopy;  Laterality: N/A;   ESOPHAGOGASTRODUODENOSCOPY (EGD) WITH PROPOFOL N/A 03/30/2022   Procedure: ESOPHAGOGASTRODUODENOSCOPY (EGD) WITH PROPOFOL;  Surgeon: Hilarie Fredrickson, MD;  Location: [facility] ENDOSCOPY;  Service: Gastroenterology;  Laterality: N/A;   FRACTURE SURGERY     gall stone removal  05/2003   GASTRIC RESTRICTION SURGERY  1984   "stapeling"   HERNIA REPAIR  2004   "in my stomach; had OR on it twice", wire mesh on 1 hernia   LEFT HEART CATH AND CORS/GRAFTS ANGIOGRAPHY N/A 07/09/2016   Procedure: LEFT HEART CATH AND CORS/GRAFTS ANGIOGRAPHY;  Surgeon: Laurey Morale, MD;  Location: [facility] INVASIVE CV LAB;  Service: Cardiovascular;  Laterality: N/A;   ORIF ANKLE FRACTURE Left 05/16/2018   ORIF ANKLE FRACTURE Left 05/16/2018   Procedure: OPEN REDUCTION INTERNAL FIXATION (ORIF) Left ankle with possible syndesmosis fixation;  Surgeon: Yolonda Kida, MD;  Location: [facility] OR;  Service: Orthopedics;  Laterality: Left;    OVARY SURGERY  1970's   "tumor removed"   PERCUTANEOUS CORONARY STENT INTERVENTION (PCI-S) N/A 06/13/2013   Procedure: PERCUTANEOUS CORONARY STENT INTERVENTION (PCI-S);  Surgeon: Corky Crafts, MD;  Location: [facility] CATH LAB;  Service: Cardiovascular;  Laterality: N/A;   PERCUTANEOUS STENT INTERVENTION N/A 04/24/2013   Procedure: PERCUTANEOUS STENT INTERVENTION;  Surgeon: Corky Crafts, MD;  Location: [facility] CATH LAB;  Service: Cardiovascular;  Laterality: N/A;   RIGHT/LEFT HEART CATH AND CORONARY/GRAFT ANGIOGRAPHY N/A 10/20/2017    Procedure: RIGHT/LEFT HEART CATH AND CORONARY/GRAFT ANGIOGRAPHY;  Surgeon: Laurey Morale, MD;  Location: [facility] INVASIVE CV LAB;  Service: Cardiovascular;  Laterality: N/A;   RIGHT/LEFT HEART CATH AND CORONARY/GRAFT ANGIOGRAPHY N/A 04/20/2021   Procedure: RIGHT/LEFT HEART CATH AND CORONARY/GRAFT ANGIOGRAPHY;  Surgeon: Runell Gess, MD;  Location: [facility] INVASIVE CV LAB;  Service: Cardiovascular;  Laterality: N/A;   ROOT CANAL  10/2000   TEE WITHOUT CARDIOVERSION N/A 01/18/2019   Procedure: TRANSESOPHAGEAL ECHOCARDIOGRAM (TEE);  Surgeon: Laurey Morale, MD;  Location: [facility] ENDOSCOPY;  Service: Cardiovascular;  Laterality: N/A;   TIBIA FRACTURE SURGERY Right 1970's   rods and pins   TOOTH EXTRACTION     "1 on the upper; wisdom tooth on the lower" (01/16/2018)   TOTAL ABDOMINAL HYSTERECTOMY  1970's   w/ appendectomy    Family History:  Family History  Problem Relation Age of Onset   Hypertension Mother    Deep vein thrombosis Mother    AAA (abdominal aortic aneurysm) Mother    Dementia Mother    Heart disease Father        Heart Disease before age 39   Diabetes Father    Hyperlipidemia Father    Hypertension Father    Heart attack Father  Deep vein thrombosis Father    AAA (abdominal aortic aneurysm) Father    Cancer Sister        Ovarian   Hypertension Sister    Diabetes Paternal Aunt    Heart disease Paternal Uncle    Diabetes Paternal Uncle    Stroke Paternal Uncle    Diabetes Paternal Uncle    Dementia Maternal Grandmother    Stroke Maternal Grandmother    Diabetes Paternal Grandmother    Stroke Paternal Grandmother    Heart attack Paternal Grandfather 62       died of MI at 17   Colon cancer Neg Hx     Social History: Social History   Socioeconomic History   Marital status: Married    Spouse name: Onalee Hua    Number of children: 1   Years of education: 12+   Highest education level: Not on file  Occupational History   Occupation: retired   Occupation: retired   Tobacco Use   Smoking status: Never   Smokeless tobacco: Never  Vaping Use   Vaping status: Never Used  Substance and Sexual Activity   Alcohol use: Never   Drug use: Never   Sexual activity: Not Currently    Birth control/protection: Surgical    Comment: hysterectomy  Other Topics Concern   Not on file  Social History Narrative   Patient lives at home with husband Onalee Hua.    Patient is right handed.   Patient drinks caffeine occasionally.   Social Determinants of Health   Financial Resource Strain: Not on file  Food Insecurity: Not on file  Transportation Needs: Not on file  Physical Activity: Not on file  Stress: Not on file  Social Connections: Not on file    Allergies:  Allergies  Allergen Reactions   Amoxicillin Shortness Of Breath and Rash   Brilinta [Ticagrelor] Shortness Of Breath   Erythromycin Shortness Of Breath, Other (See Comments) and Hives    Trouble swallowing   Flagyl [Metronidazole] Shortness Of Breath and Palpitations   Penicillins Hives, Shortness Of Breath, Rash and Other (See Comments)    Has patient had a PCN reaction causing immediate rash, facial/tongue/throat swelling, SOB or lightheadedness with hypotension: Yes Has patient had a PCN reaction causing severe rash involving mucus membranes or skin necrosis: No Has patient had a PCN reaction that required hospitalization: Yes Has patient had a PCN reaction occurring within the last 10 years: No If all of the above answers are "NO", then may proceed with Cephalosporin use.    Isosorbide Mononitrate [Isosorbide Nitrate] Other (See Comments)    Joint aches, muscles hurt, difficult to walk    Jardiance [Empagliflozin] Other (See Comments)    Nausea, joint aches, muscles aches   Metformin And Related Other (See Comments)    Stomach pain, cold sweats, joint pain, burred vision, dizziness   Tape Other (See Comments)    Skin pulls off with certain types Plastic tape causes skin to rip if left on  for long periods of time    Objective:    Vital Signs:   Temp:  [99 F (37.2 C)] 99 F (37.2 C) (09/20 1205) Pulse Rate:  [65] 65 (09/20 1205) Resp:  [18] 18 (09/20 1205) BP: (164)/(63) 164/63 (09/20 1205) SpO2:  [88 %-95 %] 88 % (09/20 1455) Weight:  [102.1 kg] 102.1 kg (09/20 1205)   Filed Weights   10/22/22 1205  Weight: 102.1 kg     Physical Exam     General: NAD, obese.  HEENT: Normal Neck: Supple. No JVD. Carotids 2+ bilat; no bruits. No lymphadenopathy or thyromegaly appreciated. Cor: PMI nondisplaced. Regular rate & rhythm. No rubs, gallops or murmurs. Lungs: Clear Abdomen: Soft, nontender, nondistended. No hepatosplenomegaly. No bruits or masses. Good bowel sounds. Extremities: No cyanosis, clubbing, rash, edema Neuro: Alert & oriented x 3, cranial nerves grossly intact. moves all 4 extremities w/o difficulty. Affect pleasant.   Telemetry   NSR, 80s (personally reviewed)   Labs     Basic Metabolic Panel: No results for input(s): "NA", "K", "CL", "CO2", "GLUCOSE", "BUN", "CREATININE", "CALCIUM", "MG", "PHOS" in the last 168 hours.  Liver Function Tests: No results for input(s): "AST", "ALT", "ALKPHOS", "BILITOT", "PROT", "ALBUMIN" in the last 168 hours. No results for input(s): "LIPASE", "AMYLASE" in the last 168 hours. No results for input(s): "AMMONIA" in the last 168 hours.  CBC: No results for input(s): "WBC", "NEUTROABS", "HGB", "HCT", "MCV", "PLT" in the last 168 hours.  Cardiac Enzymes: No results for input(s): "CKTOTAL", "CKMB", "CKMBINDEX", "TROPONINI" in the last 168 hours.  BNP: BNP (last 3 results) Recent Labs    04/14/22 1207 08/26/22 1207 10/01/22 1244  BNP 162.8* 67.0 228.4*    ProBNP (last 3 results) No results for input(s): "PROBNP" in the last 8760 hours.   CBG: Recent Labs  Lab 10/22/22 1200  GLUCAP 128*    Coagulation Studies: No results for input(s): "LABPROT", "INR" in the last 72 hours.  Imaging: CARDIAC  CATHETERIZATION  Result Date: 10/22/2022   Ost LAD to Prox LAD lesion is 90% stenosed.   Prox LAD lesion is 100% stenosed.   Ost Cx to Prox Cx lesion is 70% stenosed.   Mid RCA to Dist RCA lesion is 40% stenosed.   Prox RCA to Mid RCA lesion is 70% stenosed.   Origin lesion is 100% stenosed.   1st Mrg lesion is 100% stenosed.   3rd Mrg lesion is 100% stenosed.   Ost RCA to Prox RCA lesion is 50% stenosed.   Prox Graft lesion is 30% stenosed.   Origin to Prox Graft lesion is 30% stenosed.   Prox RCA lesion is 99% stenosed. 1. Mildly elevated PCWP and LVEDP 2. Mixed pulmonary venous/pulmonary arterial hypertension, moderate.  PVR 3 WU. 3. Preserved cardiac output. 4. Known occluded SVG-PDA.  Native RCA has stents extending from the ostium to the distal vessel.  99% in-stent restenosis in the proximal RCA, at least 2 layers of stent are present. Discussed with Dr. Herbie Baltimore, will plan on cutting balloon angioplasty to this lesion on Monday.  However, long-term, this vessel will remain very tenuous.  Will admit over weekend due to unstable angina.    Assessment/Plan   1. CAD: CABG x 5 in 12/11.  Known occluded SVG-RCA.  Status post opening of CTO RCA with 4 overlapping Xience DES in 5/15.  DES to sequential SVG-OM1/PLOM in 9/19. LHC in 3/23 showed severe native coronary disease with 90% in-stent restenosis in the proximal RCA (patient has 2 layers of stent at this site). The sequential SVG-OM1/PLOM was patent, SVG-D was patent, and LIMA-LAD was patent. She underwent PTCA of pRCA in 3/23. 8/24 echo showed EF further reduced, now 30-35% with dyspnea and chest tightness walking around the house.  Worry for unstable angina based on progressive chest pain in last few days.  Cath today showed 99% in-stent restenosis in the proximal RCA (known occluded SVG-RCA).  Other coronary disease was stable.   - Discussed with Dr. Herbie Baltimore, plan for cutting balloon angioplasty to the  proximal RCA on Monday.  Will keep in hospital  until then given unstable symptoms. - Check Troponin.  - Continue apixaban and Plavix long-term (has been on chronic Plavix, neuro has wanted her to stay on this for cerebrovascular disease).  Will continue Plavix today, continue to hold Eliquis and start heparin gtt in the morning (atrial fibrillation). If troponin elevated, can start sooner.  - Continue ranolazine 1000 mg bid. Will get ECG.  - Off Imdur with headache. - Off Toprol XL with bradycardia.   2. Chronic systolic CHF: Echo in 1/19 with EF 55-60%, moderate diastolic dysfunction. Cardiomems placed 01/16/18.  Echo in 3/20 in setting of severe PNA with intubation showed EF 25-30%, mid-apical LV severe hypokinesis.  Possible stress (Takotsubo-type) cardiomyopathy related to severe medical illness versus progression of CAD.  She has baseline chronic LBBB which could play a role in cardiomyopathy as well (LBBB cardiomyopathy).  Repeat echo in 11/20 with EF up to 45-50%, moderately dysfunctional RV.  TEE in 12/20 with EF 50-55% with septal-lateral dyssynchrony and mildly dysfunctional RV.  Echo in 11/22 with EF 55% with normal RV.  cMRI in 1/23 with EF 48% and evidence for prior inferior MI.  Echo in 8/24 with fall in EF to 30-35%, normal RV.  Fall may be due to severe RCA disease.  Ongoing NYHA class III-IIIb symptoms.  Mildly elevated PCWP today with normal RA pressure.  - Continue spiro 25 mg daily.  - Continue torsemide 10 mg daily starting tomorrow.  Will give 1 dose of Lasix 40 mg IV later this evening. . - Continue Entresto 97/103 mg bid.  - Cannot tolerate SGLT2i. - Not on beta blocker due to bradycardia.  - With significant reduction in her EF, her LBBB is wide and she may benefit from CRT if EF does not improve with coronary intervention.  3. Hyperlipidemia: She remains on Zetia, Crestor and Leqvio.  - Followed in lipid clinic. 4. Hypertension: BP elevated today but has not had home meds.   - Continue GDMT for heart failure.  -  Continue amlodipine 5 mg daily.  5. Cerebrovascular disease: She has history of CVA and had left carotid stent in 2/15. Followed by [facility].  Last MRA head showed severe stenosis of supraclinoid RICA and severe basilar artery stenosis.  - Neurology would like her to continue Plavix for this long-term despite concomitant apixaban use.  6. OHS/OSA: Cannot tolerate CPAP.  Uses oxygen at night.  7. Atrial fibrillation: Paroxysmal. 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 No recent palpitations, NSR today.  - Start heparin gtt in am (will need cutting balloon angioplasty on Monday).  Restart apixaban after intervention.  8. Obesity: Body mass index is 43.76 kg/m. - She is now on Ozempic. 9. Mitral regurgitation: Mild-moderate on 8/24 echo.  10. Bradycardia: Now off Toprol XL.   11. Mitral annular mass: This is a well-circumscribed calcified posterior mitral annulus mass noted on 5/22 and 11/22 echoes.  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     Marca Ancona, MD 10/22/2022, 3:52 PM  Advanced Heart Failure Team Pager [phone] (M-F; 7a - 5p)  Please contact [facility] for night-coverage after hours (4p -7a ) and weekends on [email or web address]

## 2022-10-22 NOTE — Progress Notes (Signed)
Site area- right  Site Prior to Removal- 0   Pressure Applied For-  25 MInutes   Bedrest Beginning at - 1700   Manual- Yes   Patient Status During Pull- Stable    Post Pull Groin Site- 0   Post Pull Instructions Given- Yes   Post Pull Pulses Present- Yes    Dressing Applied- Tegaderm and Gauze Dressing    Comments:  Pt tolerated sheath pull well. No c/o at this time.

## 2022-10-22 NOTE — Interval H&P Note (Signed)
History and Physical Interval Note:  10/22/2022 2:28 PM  [name] [name]  has presented today for surgery, with the diagnosis of Heart Failure and CAD.  The various methods of treatment have been discussed with the patient and family. After consideration of risks, benefits and other options for treatment, the patient has consented to  Procedure(s): RIGHT/LEFT HEART CATH AND CORONARY/GRAFT ANGIOGRAPHY (N/A) as a surgical intervention.  The patient's history has been reviewed, patient examined, no change in status, stable for surgery.  I have reviewed the patient's chart and labs.  Questions were answered to the patient's satisfaction.     Anirudh Baiz [employer]

## 2022-10-22 NOTE — Progress Notes (Signed)
Client c/o 4/10 chest pressure and states it has been going on for a month

## 2022-10-23 DIAGNOSIS — I200 Unstable angina: Secondary | ICD-10-CM

## 2022-10-23 LAB — CBC
HCT: 41.2 % (ref 36.0–46.0)
Hemoglobin: 13.8 g/dL (ref 12.0–15.0)
MCH: 30.8 pg (ref 26.0–34.0)
MCHC: 33.5 g/dL (ref 30.0–36.0)
MCV: 92 fL (ref 80.0–100.0)
Platelets: 154 10*3/uL (ref 150–400)
RBC: 4.48 MIL/uL (ref 3.87–5.11)
RDW: 13.5 % (ref 11.5–15.5)
WBC: 9.6 10*3/uL (ref 4.0–10.5)
nRBC: 0 % (ref 0.0–0.2)

## 2022-10-23 LAB — GLUCOSE, CAPILLARY
Glucose-Capillary: 109 mg/dL — ABNORMAL HIGH (ref 70–99)
Glucose-Capillary: 133 mg/dL — ABNORMAL HIGH (ref 70–99)
Glucose-Capillary: 134 mg/dL — ABNORMAL HIGH (ref 70–99)
Glucose-Capillary: 112 mg/dL — ABNORMAL HIGH (ref 70–99)

## 2022-10-23 LAB — HEPARIN LEVEL (UNFRACTIONATED)
Heparin Unfractionated: 0.25 IU/mL — ABNORMAL LOW (ref 0.30–0.70)
Heparin Unfractionated: 0.67 IU/mL (ref 0.30–0.70)
Heparin Unfractionated: 0.72 IU/mL — ABNORMAL HIGH (ref 0.30–0.70)

## 2022-10-23 LAB — BASIC METABOLIC PANEL
Anion gap: 7 (ref 5–15)
BUN: 14 mg/dL (ref 8–23)
CO2: 26 mmol/L (ref 22–32)
Calcium: 8.5 mg/dL — ABNORMAL LOW (ref 8.9–10.3)
Chloride: 107 mmol/L (ref 98–111)
Creatinine, Ser: 1.18 mg/dL — ABNORMAL HIGH (ref 0.44–1.00)
GFR, Estimated: 48 mL/min — ABNORMAL LOW (ref >60)
Glucose, Bld: 140 mg/dL — ABNORMAL HIGH (ref 70–99)
Potassium: 4 mmol/L (ref 3.5–5.1)
Sodium: 140 mmol/L (ref 135–145)

## 2022-10-23 LAB — APTT
aPTT: 26 seconds (ref 24–36)
aPTT: 73 seconds — ABNORMAL HIGH (ref 24–36)

## 2022-10-23 MED ORDER — HEPARIN (PORCINE) 25000 UT/250ML-% IV SOLN
900.0000 [IU]/h | INTRAVENOUS | Status: DC
  Administered 2022-10-23: 1100 [IU]/h via INTRAVENOUS
  Administered 2022-10-24: 1000 [IU]/h via INTRAVENOUS
  Filled 2022-10-23 (×2): qty 250

## 2022-10-23 NOTE — Progress Notes (Signed)
Patient moved to a sitting position on bed and felt a twisting pain on her right groin, painscore 10/10. Moved her to a flat position on bed. Noted hematoma at right groin to upper inner thigh, hematoma borders marked. Placed pressure to the right groin for 20 mins . Notified Dr. Hyacinth Meeker. Patient on bedrest until 2am.

## 2022-10-23 NOTE — Progress Notes (Signed)
   Patient Name: GAYLON THEARD Date of Encounter: 10/23/2022 [facility] Cardiologist: Lance Muss, MD   Interval Summary  .    Admitted with angina.  Post diagnostic cardiac catheterization see below.  Overnight done well.  Blood pressure however low this morning.  She has chronic back pain.  Hemoglobin is stable 13.8, 14.4 yesterday.  On IV heparin.  Eliquis halted.  No significant hematoma appreciated.  Soft.  Moves lower extremities.  Vital Signs .    Vitals:   10/23/22 0810 10/23/22 0828 10/23/22 0903 10/23/22 1030  BP:  (!) 92/57 (!) 84/50 (!) 80/46  Pulse: (!) 47  (!) 49 (!) 49  Resp: 19  20 19   Temp:  98 F (36.7 C) 98.1 F (36.7 C)   TempSrc:  Oral    SpO2: 93%  90% 90%  Weight:      Height:        Intake/Output Summary (Last 24 hours) at 10/23/2022 1127 Last data filed at 10/23/2022 1610 Gross per 24 hour  Intake 255.78 ml  Output 1800 ml  Net -1544.22 ml      10/23/2022    3:27 AM 10/22/2022   12:05 PM 10/01/2022   11:55 AM  Last 3 Weights  Weight (lbs) 226 lb 13.7 oz 225 lb 231 lb 9.6 oz  Weight (kg) 102.9 kg 102.059 kg 105.053 kg      Telemetry/ECG    SB 49 - Personally Reviewed  Physical Exam .   GEN: No acute distress.   Neck: No JVD Cardiac: BradyRR, no murmurs, rubs, or gallops.  Respiratory: Clear to auscultation bilaterally. GI: Soft, nontender, non-distended  MS: No edema  Assessment & Plan .     77 year old with exertional anginal symptoms placed for Cutting Balloon angioplasty to the proximal RCA Dr. Herbie Baltimore on Monday. -Off isosorbide secondary to headache off Toprol-XL secondary to bradycardia. On Plavix, Ranexa.  CAD CABG x 5 in 2011.    Chronic systolic heart failure - EF 30% --On Entresto 97/103 BID, Spironolactone 25 every day, Torsemide 10 every day. Hold amlodipine given low BP.  Hold Entresto, hold spironolactone, hold torsemide.  She does state that she is compliant with her medications at home.  Perhaps  underlying hematoma pushing on femoral nerve sheath contributing to hypotension.  Chronic back pain  unchanged.  If hypotension worsens or if hemoglobin decreases significantly, low threshold for CT to look for any evidence of retroperitoneal bleeding.  Continue to monitor.  Hyperlipidemia: She remains on Zetia, Crestor and Leqvio.  - Followed in lipid clinic.  Prior CVA and had left carotid stent in 2/15. Followed by [facility].  Last MRA head showed severe stenosis of supraclinoid RICA and severe basilar artery stenosis. Plavix with Eliquis.   Hold Ozempic.  For questions or updates, please contact [facility] Please consult [email or web address] for contact info under        Signed, Donato Schultz, MD

## 2022-10-23 NOTE — Progress Notes (Signed)
ANTICOAGULATION CONSULT NOTE Pharmacy Consult for Heparin Indication: atrial fibrillation  Allergies  Allergen Reactions   Amoxicillin Shortness Of Breath and Rash   Brilinta [Ticagrelor] Shortness Of Breath   Erythromycin Shortness Of Breath, Other (See Comments) and Hives    Trouble swallowing   Flagyl [Metronidazole] Shortness Of Breath and Palpitations   Penicillins Hives, Shortness Of Breath, Rash and Other (See Comments)    Has patient had a PCN reaction causing immediate rash, facial/tongue/throat swelling, SOB or lightheadedness with hypotension: Yes Has patient had a PCN reaction causing severe rash involving mucus membranes or skin necrosis: No Has patient had a PCN reaction that required hospitalization: Yes Has patient had a PCN reaction occurring within the last 10 years: No If all of the above answers are "NO", then may proceed with Cephalosporin use.    Isosorbide Mononitrate [Isosorbide Nitrate] Other (See Comments)    Joint aches, muscles hurt, difficult to walk    Jardiance [Empagliflozin] Other (See Comments)    Nausea, joint aches, muscles aches   Metformin And Related Other (See Comments)    Stomach pain, cold sweats, joint pain, burred vision, dizziness   Tape Other (See Comments)    Skin pulls off with certain types Plastic tape causes skin to rip if left on for long periods of time    Patient Measurements: Height: 5\' 2"  (157.5 cm) Weight: 102.9 kg (226 lb 13.7 oz) IBW/kg (Calculated) : 50.1 Heparin Dosing Weight:  75 kg  Vital Signs: Temp: (P) 98.1 F (36.7 C) (09/21 1130) Temp Source: (P) Oral (09/21 1130) BP: 99/55 (09/21 1300) Pulse Rate: 48 (09/21 1300)  Labs: Recent Labs    10/22/22 1717 10/22/22 1749 10/23/22 0441 10/23/22 1347  HGB 14.4 15.4* 13.8  --   HCT 42.8 45.7 41.2  --   PLT 139* 143* 154  --   APTT  --   --  26 73*  HEPARINUNFRC  --   --  0.25* 0.67  CREATININE 0.99 0.83 1.18*  --   TROPONINIHS 15 15  --   --      Estimated Creatinine Clearance: 47 mL/min (A) (by C-G formula based on SCr of 1.18 mg/dL (H)).   Medical History: Past Medical History:  Diagnosis Date   Anemia    hx   Anxiety    Asthma    Basal cell carcinoma 05/2014   "left shoulder"   Bundle branch block, left    chronic/notes 07/18/2013   CHF (congestive heart failure) (HCC)    Chronic insomnia 05/06/2015   Chronic kidney disease    frequency, sees dr Camille Bal every 4 to 6 months (01/16/2018)   Chronic lower back pain    Claustrophobia    Common migraine 05/14/2014   Coronary artery disease    MI in 2001, 2002, 2006, 2011, 2014   Depression    Diabetic peripheral neuropathy (HCC) 01/12/2019   GERD (gastroesophageal reflux disease)    H/O hiatal hernia    Headache    "at least 2/month" (01/16/2018)   Heart murmur    Hyperlipidemia    Hypertension    Memory change 05/14/2014   Migraine    "5-6/year"  (01/16/2018)   Obesity 01-2010   Obstructive sleep apnea    "can't wear machine; I have claustrophobia" (01/16/2018), states she had a 2nd sleep study and does not have sleep apnea, her O2 decreases and now is on 2 L of O2 at night.   On home oxygen therapy    "2L  at night and prn during daytime" (01/16/2018)   Osteoarthritis    "knees and hands" (01/16/2018)   Peripheral vascular disease (HCC)    ? numbness, tingling arms and legs   PONV (postoperative nausea and vomiting)    Stroke (HCC)    2014, 2015, 2016   Type II diabetes mellitus (HCC)    Ventral hernia    hx of    Medications:  Scheduled:   ALPRAZolam  0.5 mg Oral Q lunch   And   ALPRAZolam  1 mg Oral QHS   buPROPion  300 mg Oral Daily   clopidogrel  75 mg Oral q morning   cyanocobalamin  1,000 mcg Oral q morning   DULoxetine  30 mg Oral Daily   ezetimibe  10 mg Oral QHS   gabapentin  600 mg Oral q morning   And   gabapentin  1,200 mg Oral QHS   insulin aspart  0-15 Units Subcutaneous TID WC   insulin aspart  0-5 Units Subcutaneous QHS    mometasone-formoterol  2 puff Inhalation BID   ondansetron  4 mg Oral BID   pantoprazole  40 mg Oral Daily   ranolazine  1,000 mg Oral BID   rosuvastatin  40 mg Oral Daily   sacubitril-valsartan  1 tablet Oral BID   sodium chloride flush  3 mL Intravenous Q12H   sodium chloride flush  3 mL Intravenous Q12H   spironolactone  25 mg Oral Daily   torsemide  10 mg Oral Daily   traZODone  100 mg Oral QHS    Assessment: 77 y.o. female with a history of afib and on Eliquis PTA. Last dose of Eliquis was 9/18 at 1000. She has been admitted with a plan to return to cath lab on Monday for cutting balloon angioplasty to ISR in the RCA.   Baseline heparin level was 0.25 and baseline aPTT was 26. Repeat heparin level was 0.67 and repeat aPTT level was 73. Both levels are therapeutic and correlating, so will continue to monitor with heparin levels only moving forward. Since levels are at higher end of range, will decrease heparin drip slightly.  No issues with infusion or new bleeding noted. Hgb stable at 13.8 and PLT increased from 143 to 154.   Goal of Therapy:  aPTT 66-102 sec Heparin level 0.3-0.7 units/ml Monitor platelets by anticoagulation protocol: Yes   Plan:  Decrease heparin infusion to 1000 units/hr  Check heparin level in 8 hours  Check heparin level and CBC daily while on heparin  Monitor for s/sx of bleeding  Lennie Muckle, PharmD PGY1 Pharmacy Resident 10/23/2022 3:07 PM

## 2022-10-23 NOTE — Progress Notes (Signed)
ANTICOAGULATION CONSULT NOTE - Initial Consult  Pharmacy Consult for Heparin Indication: atrial fibrillation  Allergies  Allergen Reactions   Amoxicillin Shortness Of Breath and Rash   Brilinta [Ticagrelor] Shortness Of Breath   Erythromycin Shortness Of Breath, Other (See Comments) and Hives    Trouble swallowing   Flagyl [Metronidazole] Shortness Of Breath and Palpitations   Penicillins Hives, Shortness Of Breath, Rash and Other (See Comments)    Has patient had a PCN reaction causing immediate rash, facial/tongue/throat swelling, SOB or lightheadedness with hypotension: Yes Has patient had a PCN reaction causing severe rash involving mucus membranes or skin necrosis: No Has patient had a PCN reaction that required hospitalization: Yes Has patient had a PCN reaction occurring within the last 10 years: No If all of the above answers are "NO", then may proceed with Cephalosporin use.    Isosorbide Mononitrate [Isosorbide Nitrate] Other (See Comments)    Joint aches, muscles hurt, difficult to walk    Jardiance [Empagliflozin] Other (See Comments)    Nausea, joint aches, muscles aches   Metformin And Related Other (See Comments)    Stomach pain, cold sweats, joint pain, burred vision, dizziness   Tape Other (See Comments)    Skin pulls off with certain types Plastic tape causes skin to rip if left on for long periods of time    Patient Measurements: Height: 5\' 2"  (157.5 cm) Weight: 102.1 kg (225 lb) IBW/kg (Calculated) : 50.1 Heparin Dosing Weight:  75 kg  Vital Signs: Temp: 98.7 F (37.1 C) (09/21 0001) Temp Source: Oral (09/21 0001) BP: 106/43 (09/21 0001) Pulse Rate: 54 (09/21 0001)  Labs: Recent Labs    10/22/22 1440 10/22/22 1717 10/22/22 1749  HGB 13.3  13.6 14.4 15.4*  HCT 39.0  40.0 42.8 45.7  PLT  --  139* 143*  CREATININE  --  0.99 0.83  TROPONINIHS  --  15 15    Estimated Creatinine Clearance: 66.6 mL/min (by C-G formula based on SCr of 0.83  mg/dL).   Medical History: Past Medical History:  Diagnosis Date   Anemia    hx   Anxiety    Asthma    Basal cell carcinoma 05/2014   "left shoulder"   Bundle branch block, left    chronic/notes 07/18/2013   CHF (congestive heart failure) (HCC)    Chronic insomnia 05/06/2015   Chronic kidney disease    frequency, sees dr Camille Bal every 4 to 6 months (01/16/2018)   Chronic lower back pain    Claustrophobia    Common migraine 05/14/2014   Coronary artery disease    MI in 2001, 2002, 2006, 2011, 2014   Depression    Diabetic peripheral neuropathy (HCC) 01/12/2019   GERD (gastroesophageal reflux disease)    H/O hiatal hernia    Headache    "at least 2/month" (01/16/2018)   Heart murmur    Hyperlipidemia    Hypertension    Memory change 05/14/2014   Migraine    "5-6/year"  (01/16/2018)   Obesity 01-2010   Obstructive sleep apnea    "can't wear machine; I have claustrophobia" (01/16/2018), states she had a 2nd sleep study and does not have sleep apnea, her O2 decreases and now is on 2 L of O2 at night.   On home oxygen therapy    "2L at night and prn during daytime" (01/16/2018)   Osteoarthritis    "knees and hands" (01/16/2018)   Peripheral vascular disease (HCC)    ? numbness, tingling arms and  legs   PONV (postoperative nausea and vomiting)    Stroke (HCC)    2014, 2015, 2016   Type II diabetes mellitus (HCC)    Ventral hernia    hx of    Medications:  Scheduled:   ALPRAZolam  0.5 mg Oral Q lunch   And   ALPRAZolam  1 mg Oral QHS   amLODipine  5 mg Oral QHS   aspirin       buPROPion  300 mg Oral Daily   clopidogrel  75 mg Oral q morning   cyanocobalamin  1,000 mcg Oral q morning   DULoxetine  30 mg Oral Daily   ezetimibe  10 mg Oral QHS   gabapentin  600 mg Oral q morning   And   gabapentin  1,200 mg Oral QHS   insulin aspart  0-15 Units Subcutaneous TID WC   insulin aspart  0-5 Units Subcutaneous QHS   labetalol       mometasone-formoterol  2 puff  Inhalation BID   nitroGLYCERIN       ondansetron  4 mg Oral BID   pantoprazole  40 mg Oral Daily   ranolazine  1,000 mg Oral BID   rosuvastatin  40 mg Oral Daily   sacubitril-valsartan  1 tablet Oral BID   sodium chloride flush  3 mL Intravenous Q12H   sodium chloride flush  3 mL Intravenous Q12H   spironolactone  25 mg Oral Daily   torsemide  10 mg Oral Daily   traZODone  100 mg Oral QHS    Assessment: 77 y.o. female with h/o Afib, Eliquis on hold, for heparin  Goal of Therapy:  aPTT 66-102 sec Heparin level 0.3-0.7 units/ml Monitor platelets by anticoagulation protocol: Yes   Plan:  Start heparin 1100 units/hr  Check aPTT in 8 hours   Eddie Candle 10/23/2022,12:16 AM

## 2022-10-23 NOTE — Plan of Care (Signed)
  Problem: Metabolic: Goal: Ability to maintain appropriate glucose levels will improve Outcome: Progressing   Problem: Nutritional: Goal: Maintenance of adequate nutrition will improve Outcome: Progressing Goal: Progress toward achieving an optimal weight will improve Outcome: Progressing   Problem: Tissue Perfusion: Goal: Adequacy of tissue perfusion will improve Outcome: Progressing   Problem: Clinical Measurements: Goal: Ability to maintain clinical measurements within normal limits will improve Outcome: Progressing Goal: Will remain free from infection Outcome: Progressing Goal: Respiratory complications will improve Outcome: Progressing Goal: Cardiovascular complication will be avoided Outcome: Progressing   Problem: Nutrition: Goal: Adequate nutrition will be maintained Outcome: Progressing   Problem: Pain Managment: Goal: General experience of comfort will improve Outcome: Progressing   Problem: Safety: Goal: Ability to remain free from injury will improve Outcome: Progressing

## 2022-10-24 ENCOUNTER — Inpatient Hospital Stay (HOSPITAL_COMMUNITY): Payer: Medicare Other

## 2022-10-24 DIAGNOSIS — I200 Unstable angina: Secondary | ICD-10-CM | POA: Diagnosis not present

## 2022-10-24 LAB — CBC
HCT: 37.2 % (ref 36.0–46.0)
HCT: 34.7 % — ABNORMAL LOW (ref 36.0–46.0)
Hemoglobin: 12.1 g/dL (ref 12.0–15.0)
Hemoglobin: 11.5 g/dL — ABNORMAL LOW (ref 12.0–15.0)
MCH: 30.9 pg (ref 26.0–34.0)
MCH: 31.2 pg (ref 26.0–34.0)
MCHC: 32.5 g/dL (ref 30.0–36.0)
MCHC: 33.1 g/dL (ref 30.0–36.0)
MCV: 95.1 fL (ref 80.0–100.0)
MCV: 94 fL (ref 80.0–100.0)
Platelets: 107 10*3/uL — ABNORMAL LOW (ref 150–400)
Platelets: 114 10*3/uL — ABNORMAL LOW (ref 150–400)
RBC: 3.91 MIL/uL (ref 3.87–5.11)
RBC: 3.69 MIL/uL — ABNORMAL LOW (ref 3.87–5.11)
RDW: 13.4 % (ref 11.5–15.5)
RDW: 13 % (ref 11.5–15.5)
WBC: 7.5 10*3/uL (ref 4.0–10.5)
WBC: 7.4 10*3/uL (ref 4.0–10.5)
nRBC: 0 % (ref 0.0–0.2)
nRBC: 0 % (ref 0.0–0.2)

## 2022-10-24 LAB — BASIC METABOLIC PANEL WITH GFR
Anion gap: 9 (ref 5–15)
BUN: 37 mg/dL — ABNORMAL HIGH (ref 8–23)
CO2: 26 mmol/L (ref 22–32)
Calcium: 8.1 mg/dL — ABNORMAL LOW (ref 8.9–10.3)
Chloride: 101 mmol/L (ref 98–111)
Creatinine, Ser: 2.72 mg/dL — ABNORMAL HIGH (ref 0.44–1.00)
GFR, Estimated: 18 mL/min — ABNORMAL LOW
Glucose, Bld: 102 mg/dL — ABNORMAL HIGH (ref 70–99)
Potassium: 4.1 mmol/L (ref 3.5–5.1)
Sodium: 136 mmol/L (ref 135–145)

## 2022-10-24 LAB — TYPE AND SCREEN
ABO/RH(D): AB POS
Antibody Screen: NEGATIVE

## 2022-10-24 LAB — GLUCOSE, CAPILLARY
Glucose-Capillary: 107 mg/dL — ABNORMAL HIGH (ref 70–99)
Glucose-Capillary: 152 mg/dL — ABNORMAL HIGH (ref 70–99)
Glucose-Capillary: 126 mg/dL — ABNORMAL HIGH (ref 70–99)
Glucose-Capillary: 132 mg/dL — ABNORMAL HIGH (ref 70–99)

## 2022-10-24 LAB — HEPARIN LEVEL (UNFRACTIONATED)
Heparin Unfractionated: 0.79 [IU]/mL — ABNORMAL HIGH (ref 0.30–0.70)
Heparin Unfractionated: 0.68 IU/mL (ref 0.30–0.70)

## 2022-10-24 LAB — MRSA NEXT GEN BY PCR, NASAL: MRSA by PCR Next Gen: NOT DETECTED

## 2022-10-24 MED ORDER — SODIUM CHLORIDE 0.9 % IV SOLN: INTRAVENOUS | Status: AC

## 2022-10-24 MED ORDER — MORPHINE SULFATE (PF) 2 MG/ML IV SOLN
2.0000 mg | Freq: Once | INTRAVENOUS | Status: AC
  Administered 2022-10-24: 2 mg via INTRAVENOUS
  Filled 2022-10-24: qty 1

## 2022-10-24 MED ORDER — CHLORHEXIDINE GLUCONATE CLOTH 2 % EX PADS
6.0000 | MEDICATED_PAD | Freq: Every day | CUTANEOUS | Status: DC
  Administered 2022-10-25 – 2022-10-28 (×4): 6 via TOPICAL

## 2022-10-24 NOTE — Significant Event (Signed)
Rapid Response Event Note   Reason for Call :  CT A/P resulting in large SQ hematoma within R groin ext into prox R thigh anteromedially. No extension into peritoneum or retroperitoneum. Cortical infarct involving upper pole and post interpolar region of L kidney. Hypotension-80s.  Initial Focused Assessment:  Pt lying in bed with eyes open, in no visible distress. She is alert and oriented, dizzy. Lungs clear/diminished. Skin pale, cool, dry to touch. R groin painful to touch/movement. Large hematoma present, soft, and marked.    T-97.3, HR-43, BP-99/41, RR-12, SpO2-96% on 2L [place]   Interventions:  Heparin gtt off CBC/T and S STAT Tx to ICU Femostop Plan of Care:  Tx to 2H12. Femstop placed on arrival.   Event Summary:   MD Notified:  Dr. Evette Doffing notified and came to [email or web address]  Call Time:2021 Arrival Time:2024 End [id number]  Terrilyn Saver, RN

## 2022-10-24 NOTE — Progress Notes (Signed)
Mobility Specialist Progress Note:   10/24/22 1400  Mobility  Activity Ambulated with assistance in room;Ambulated with assistance to bathroom  Level of Assistance Minimal assist, patient does 75% or more  Assistive Device Front wheel walker  Distance Ambulated (ft) 50 ft (25+25)  Activity Response Tolerated fair  Mobility Referral Yes  $Mobility charge 1 Mobility  Mobility Specialist Start Time (ACUTE ONLY) 1339  Mobility Specialist Stop Time (ACUTE ONLY) 1409  Mobility Specialist Time Calculation (min) (ACUTE ONLY) 30 min    Pre Mobility: 58 HR,  94% SpO2 During Mobility: 62 HR,  94% SpO2 Post Mobility:  56 HR,  94% SpO2  Pt received in chair, agreeable to mobility. C/o feeling weak and a little dizzy. Ambulated to BR. Void unsuccessful. Pt left in bed with call bell and all needs met.  D'Vante Earlene Plater Mobility Specialist Please contact via Special educational needs teacher or Rehab office at [phone]

## 2022-10-24 NOTE — Progress Notes (Signed)
Provider on call paged via amion regarding CT abd and pelvis, also notified Rapid Response. Both are on the floor to see pt

## 2022-10-24 NOTE — Significant Event (Signed)
Rapid Response Event Note   Reason for Call :  Hypotension and dizziness  Initial Focused Assessment:  She is alert and oriented.  She states that she feels poorly and has been dizzy has some chest pain since she walked to the bathroom.  Her chest pain is improving.  She states that she has some shortness of breath but no worse than normal.  (She states her shortness of breath started a couple months ago).  Lung sounds decreased bases Heart tones regular, slow Skin is warm and dry  BP 80/54  (MAP) 63 SR 40s  RR 16  O2 sat 93% on 2L [place]   Interventions:  I&O cath Bedrest   Plan of Care:     Event Summary:   MD Notified: Shirlee Latch Call Time: 1504 Arrival Time: 1510 End Time: 1540  Marcellina Millin, RN

## 2022-10-24 NOTE — Plan of Care (Signed)
  Problem: Activity: Goal: Ability to return to baseline activity level will improve Outcome: Progressing   Problem: Cardiovascular: Goal: Ability to achieve and maintain adequate cardiovascular perfusion will improve Outcome: Progressing Goal: Vascular access site(s) Level 0-1 will be maintained Outcome: Progressing   Problem: Metabolic: Goal: Ability to maintain appropriate glucose levels will improve Outcome: Progressing   Problem: Nutritional: Goal: Maintenance of adequate nutrition will improve Outcome: Progressing Goal: Progress toward achieving an optimal weight will improve Outcome: Progressing   Problem: Tissue Perfusion: Goal: Adequacy of tissue perfusion will improve Outcome: Progressing   Problem: Clinical Measurements: Goal: Ability to maintain clinical measurements within normal limits will improve Outcome: Progressing Goal: Will remain free from infection Outcome: Progressing Goal: Diagnostic test results will improve Outcome: Progressing Goal: Respiratory complications will improve Outcome: Progressing Goal: Cardiovascular complication will be avoided Outcome: Progressing   Problem: Nutrition: Goal: Adequate nutrition will be maintained Outcome: Progressing   Problem: Coping: Goal: Level of anxiety will decrease Outcome: Progressing   Problem: Pain Managment: Goal: General experience of comfort will improve Outcome: Progressing   Problem: Safety: Goal: Ability to remain free from injury will improve Outcome: Progressing

## 2022-10-24 NOTE — Progress Notes (Signed)
ANTICOAGULATION CONSULT NOTE  Pharmacy Consult for Heparin Indication: atrial fibrillation Brief A/P: Heparin level slightly supratherapeutic Continue Heparin at current rate for now   Allergies  Allergen Reactions   Amoxicillin Shortness Of Breath and Rash   Brilinta [Ticagrelor] Shortness Of Breath   Erythromycin Shortness Of Breath, Other (See Comments) and Hives    Trouble swallowing   Flagyl [Metronidazole] Shortness Of Breath and Palpitations   Penicillins Hives, Shortness Of Breath, Rash and Other (See Comments)    Has patient had a PCN reaction causing immediate rash, facial/tongue/throat swelling, SOB or lightheadedness with hypotension: Yes Has patient had a PCN reaction causing severe rash involving mucus membranes or skin necrosis: No Has patient had a PCN reaction that required hospitalization: Yes Has patient had a PCN reaction occurring within the last 10 years: No If all of the above answers are "NO", then may proceed with Cephalosporin use.    Isosorbide Mononitrate [Isosorbide Nitrate] Other (See Comments)    Joint aches, muscles hurt, difficult to walk    Jardiance [Empagliflozin] Other (See Comments)    Nausea, joint aches, muscles aches   Metformin And Related Other (See Comments)    Stomach pain, cold sweats, joint pain, burred vision, dizziness   Tape Other (See Comments)    Skin pulls off with certain types Plastic tape causes skin to rip if left on for long periods of time    Patient Measurements: Height: 5\' 2"  (157.5 cm) Weight: 102.9 kg (226 lb 13.7 oz) IBW/kg (Calculated) : 50.1 Heparin Dosing Weight:  75 kg  Vital Signs: Temp: 97.8 F (36.6 C) (09/21 2300) Temp Source: Oral (09/21 2300) BP: 102/49 (09/21 2300) Pulse Rate: 47 (09/21 2300)  Labs: Recent Labs    10/22/22 1717 10/22/22 1749 10/23/22 0441 10/23/22 1347 10/23/22 2116  HGB 14.4 15.4* 13.8  --   --   HCT 42.8 45.7 41.2  --   --   PLT 139* 143* 154  --   --   APTT  --   --   26 73*  --   HEPARINUNFRC  --   --  0.25* 0.67 0.72*  CREATININE 0.99 0.83 1.18*  --   --   TROPONINIHS 15 15  --   --   --     Estimated Creatinine Clearance: 47 mL/min (A) (by C-G formula based on SCr of 1.18 mg/dL (H)).  Assessment: 77 y.o. female with h/o Afib, Eliquis on hold, for heparin  Goal of Therapy:  aPTT 66-102 sec Heparin level 0.3-0.7 units/ml Monitor platelets by anticoagulation protocol: Yes   Plan:  No change to heparin for now Follow-up am labs and adjust as needed at that time  [name] [name] 10/24/2022,1:16 AM

## 2022-10-24 NOTE — Progress Notes (Signed)
   Patient Name: [name] [name] Date of Encounter: 10/24/2022 [facility] Cardiologist: Lance Muss, MD   Interval Summary  .    Currently sitting up in chair.  Yesterday hypotension noted.  Medications held.  This morning signals of acute kidney injury with elevated creatinine, likely multifactorial.  She states that her chronic back pain feels better now that she is sitting up in the chair.  Vital Signs .    Vitals:   10/23/22 2300 10/24/22 0339 10/24/22 0549 10/24/22 0804  BP: (!) 102/49 (!) 110/49  (!) 108/52  Pulse: (!) 47 (!) 45  (!) 50  Resp: 15 13  14   Temp: 97.8 F (36.6 C) 97.6 F (36.4 C)  97.6 F (36.4 C)  TempSrc: Oral Oral  Oral  SpO2: 98% 98%  99%  Weight:   102.5 kg   Height:        Intake/Output Summary (Last 24 hours) at 10/24/2022 1006 Last data filed at 10/24/2022 0900 Gross per 24 hour  Intake 421.98 ml  Output --  Net 421.98 ml      10/24/2022    5:49 AM 10/23/2022    3:27 AM 10/22/2022   12:05 PM  Last 3 Weights  Weight (lbs) 225 lb 14.4 oz 226 lb 13.7 oz 225 lb  Weight (kg) 102.468 kg 102.9 kg 102.059 kg      Telemetry/ECG    Sinus bradycardia 40s-50s- Personally Reviewed  CAD CABG x 5 in 2011.      Physical Exam .   GEN: No acute distress.  Sitting up in chair Neck: No JVD Cardiac: RRR, no murmurs, rubs, or gallops.  Respiratory: Clear to auscultation bilaterally. GI: Soft, nontender, non-distended  MS: No edema  Assessment & Plan .     77 year old with diagnostic cath on 10/22/2022 with planned PCI proximal RCA with Dr. Herbie Baltimore Monday, however post catheterization hypotension with AKI-creatinine 2.72 this morning, baseline 0.83.  AKI/CAD - Post catheterization hypotension.  Medications have been held.  Hemoglobin 12.1 this morning, Was 13.3 on 10/22/2022.  -In review of records she did have a prolonged hospitalization in 2020 February where AKI occurred as well as possible ITP HIT negative.  Platelets have  fluctuated. -Hold on cardiac catheterization/planned PCI pending lab work tomorrow.  Chronic systolic heart failure -EF 30% -At home has been taking Entresto 97/103 twice daily, spironolactone 25 every day, torsemide 10 mg every day, amlodipine 5 mg daily.  Continuing to hold these medications as hypotension continues.  Paroxysmal atrial fibrillation - Eliquis currently on hold.  IV heparin present.  Currently sinus bradycardia, chronic.  Prior stroke - Left carotid stent in 2015 followed by vascular. -She has as well supraclinoid R ICA and severe basilar artery stenosis.  Neurology would like her to continue with Plavix and aspirin.  For questions or updates, please contact [facility] Please consult [email or web address] for contact info under        Signed, Donato Schultz, MD

## 2022-10-24 NOTE — Progress Notes (Signed)
ANTICOAGULATION CONSULT NOTE Pharmacy Consult for Heparin Indication: atrial fibrillation  Allergies  Allergen Reactions   Amoxicillin Shortness Of Breath and Rash   Brilinta [Ticagrelor] Shortness Of Breath   Erythromycin Shortness Of Breath, Other (See Comments) and Hives    Trouble swallowing   Flagyl [Metronidazole] Shortness Of Breath and Palpitations   Penicillins Hives, Shortness Of Breath, Rash and Other (See Comments)    Has patient had a PCN reaction causing immediate rash, facial/tongue/throat swelling, SOB or lightheadedness with hypotension: Yes Has patient had a PCN reaction causing severe rash involving mucus membranes or skin necrosis: No Has patient had a PCN reaction that required hospitalization: Yes Has patient had a PCN reaction occurring within the last 10 years: No If all of the above answers are "NO", then may proceed with Cephalosporin use.    Isosorbide Mononitrate [Isosorbide Nitrate] Other (See Comments)    Joint aches, muscles hurt, difficult to walk    Jardiance [Empagliflozin] Other (See Comments)    Nausea, joint aches, muscles aches   Metformin And Related Other (See Comments)    Stomach pain, cold sweats, joint pain, burred vision, dizziness   Tape Other (See Comments)    Skin pulls off with certain types Plastic tape causes skin to rip if left on for long periods of time    Patient Measurements: Height: 5\' 2"  (157.5 cm) Weight: 102.5 kg (225 lb 14.4 oz) IBW/kg (Calculated) : 50.1 Heparin Dosing Weight:  75 kg  Vital Signs: Temp: 97.6 F (36.4 C) (09/22 0804) Temp Source: Oral (09/22 0804) BP: 108/52 (09/22 0804) Pulse Rate: 50 (09/22 0804)  Labs: Recent Labs    10/22/22 1717 10/22/22 1749 10/22/22 1749 10/23/22 0441 10/23/22 1347 10/23/22 2116 10/24/22 0512  HGB 14.4 15.4*  --  13.8  --   --  12.1  HCT 42.8 45.7  --  41.2  --   --  37.2  PLT 139* 143*  --  154  --   --  107*  APTT  --   --   --  26 73*  --   --   HEPARINUNFRC   --   --    < > 0.25* 0.67 0.72* 0.79*  CREATININE 0.99 0.83  --  1.18*  --   --  2.72*  TROPONINIHS 15 15  --   --   --   --   --    < > = values in this interval not displayed.    Estimated Creatinine Clearance: 20.4 mL/min (A) (by C-G formula based on SCr of 2.72 mg/dL (H)).   Medical History: Past Medical History:  Diagnosis Date   Anemia    hx   Anxiety    Asthma    Basal cell carcinoma 05/2014   "left shoulder"   Bundle branch block, left    chronic/notes 07/18/2013   CHF (congestive heart failure) (HCC)    Chronic insomnia 05/06/2015   Chronic kidney disease    frequency, sees dr Camille Bal every 4 to 6 months (01/16/2018)   Chronic lower back pain    Claustrophobia    Common migraine 05/14/2014   Coronary artery disease    MI in 2001, 2002, 2006, 2011, 2014   Depression    Diabetic peripheral neuropathy (HCC) 01/12/2019   GERD (gastroesophageal reflux disease)    H/O hiatal hernia    Headache    "at least 2/month" (01/16/2018)   Heart murmur    Hyperlipidemia    Hypertension  Memory change 05/14/2014   Migraine    "5-6/year"  (01/16/2018)   Obesity 01-2010   Obstructive sleep apnea    "can't wear machine; I have claustrophobia" (01/16/2018), states she had a 2nd sleep study and does not have sleep apnea, her O2 decreases and now is on 2 L of O2 at night.   On home oxygen therapy    "2L at night and prn during daytime" (01/16/2018)   Osteoarthritis    "knees and hands" (01/16/2018)   Peripheral vascular disease (HCC)    ? numbness, tingling arms and legs   PONV (postoperative nausea and vomiting)    Stroke (HCC)    2014, 2015, 2016   Type II diabetes mellitus (HCC)    Ventral hernia    hx of    Medications:  Scheduled:   ALPRAZolam  0.5 mg Oral Q lunch   And   ALPRAZolam  1 mg Oral QHS   buPROPion  300 mg Oral Daily   clopidogrel  75 mg Oral q morning   cyanocobalamin  1,000 mcg Oral q morning   DULoxetine  30 mg Oral Daily   ezetimibe  10  mg Oral QHS   gabapentin  600 mg Oral q morning   And   gabapentin  1,200 mg Oral QHS   insulin aspart  0-15 Units Subcutaneous TID WC   insulin aspart  0-5 Units Subcutaneous QHS   mometasone-formoterol  2 puff Inhalation BID   ondansetron  4 mg Oral BID   pantoprazole  40 mg Oral Daily   ranolazine  1,000 mg Oral BID   rosuvastatin  40 mg Oral Daily   sacubitril-valsartan  1 tablet Oral BID   sodium chloride flush  3 mL Intravenous Q12H   sodium chloride flush  3 mL Intravenous Q12H   spironolactone  25 mg Oral Daily   torsemide  10 mg Oral Daily   traZODone  100 mg Oral QHS    Assessment: 77 y.o. female with a history of afib and on Eliquis PTA. Last dose of Eliquis was 9/18 at 1000. She has been admitted with a plan to return to cath lab on Monday for cutting balloon angioplasty to ISR in the RCA.   Heparin level remained supratherapeutic at 0.79 on 1000 units/hr. Hgb stable at 12.1 and PLT decreased from 154 to 107. No issues with infusion and no new bleeding noted per RN.   Goal of Therapy:  Heparin level 0.3-0.7 units/ml Monitor platelets by anticoagulation protocol: Yes   Plan:  Decrease heparin infusion to 900 units/hr  Check heparin level in 8 hours  Check heparin level and CBC daily while on heparin  Monitor for s/sx of bleeding  Lennie Muckle, PharmD PGY1 Pharmacy Resident 10/24/2022 8:06 AM

## 2022-10-24 NOTE — Progress Notes (Signed)
   Patient examined this afternoon due to persistent fatigue, dizziness, hypotension/bradycardia. Patient in no acute distress but does appear chronically ill. BP remains in the 80s-90s over 40s-50s. Patient reports some abdominal pain this afternoon, greater R than L and worse with ambulation. Given difficulty with vascular access with catheterization and post procedure hematoma, will order non-contract CT abd/pelvis to evaluate for possible retroperitoneal hematoma. Will also request bed transfer to progressive unit/higher level of care due to persistent hypotension/bradycardia.  Perlie Gold, PA-C

## 2022-10-24 NOTE — Progress Notes (Signed)
ANTICOAGULATION CONSULT NOTE- follow-up Pharmacy Consult for Heparin Indication: atrial fibrillation  Allergies  Allergen Reactions   Amoxicillin Shortness Of Breath and Rash   Brilinta [Ticagrelor] Shortness Of Breath   Erythromycin Shortness Of Breath, Other (See Comments) and Hives    Trouble swallowing   Flagyl [Metronidazole] Shortness Of Breath and Palpitations   Penicillins Hives, Shortness Of Breath, Rash and Other (See Comments)    Has patient had a PCN reaction causing immediate rash, facial/tongue/throat swelling, SOB or lightheadedness with hypotension: Yes Has patient had a PCN reaction causing severe rash involving mucus membranes or skin necrosis: No Has patient had a PCN reaction that required hospitalization: Yes Has patient had a PCN reaction occurring within the last 10 years: No If all of the above answers are "NO", then may proceed with Cephalosporin use.    Isosorbide Mononitrate [Isosorbide Nitrate] Other (See Comments)    Joint aches, muscles hurt, difficult to walk    Jardiance [Empagliflozin] Other (See Comments)    Nausea, joint aches, muscles aches   Metformin And Related Other (See Comments)    Stomach pain, cold sweats, joint pain, burred vision, dizziness   Tape Other (See Comments)    Skin pulls off with certain types Plastic tape causes skin to rip if left on for long periods of time    Patient Measurements: Height: 5\' 2"  (157.5 cm) Weight: 102.5 kg (225 lb 14.4 oz) IBW/kg (Calculated) : 50.1 Heparin Dosing Weight:  75 kg  Vital Signs: Temp: 97.6 F (36.4 C) (09/22 0804) Temp Source: Oral (09/22 0804) BP: 80/54 (09/22 1519) Pulse Rate: 44 (09/22 1519)  Labs: Recent Labs    10/22/22 1717 10/22/22 1749 10/22/22 1749 10/23/22 0441 10/23/22 1347 10/23/22 2116 10/24/22 0512 10/24/22 1737  HGB 14.4 15.4*  --  13.8  --   --  12.1  --   HCT 42.8 45.7  --  41.2  --   --  37.2  --   PLT 139* 143*  --  154  --   --  107*  --   APTT  --    --   --  26 73*  --   --   --   HEPARINUNFRC  --   --    < > 0.25* 0.67 0.72* 0.79* 0.68  CREATININE 0.99 0.83  --  1.18*  --   --  2.72*  --   TROPONINIHS 15 15  --   --   --   --   --   --    < > = values in this interval not displayed.    Estimated Creatinine Clearance: 20.4 mL/min (A) (by C-G formula based on SCr of 2.72 mg/dL (H)).   Medical History: Past Medical History:  Diagnosis Date   Anemia    hx   Anxiety    Asthma    Basal cell carcinoma 05/2014   "left shoulder"   Bundle branch block, left    chronic/notes 07/18/2013   CHF (congestive heart failure) (HCC)    Chronic insomnia 05/06/2015   Chronic kidney disease    frequency, sees dr Camille Bal every 4 to 6 months (01/16/2018)   Chronic lower back pain    Claustrophobia    Common migraine 05/14/2014   Coronary artery disease    MI in 2001, 2002, 2006, 2011, 2014   Depression    Diabetic peripheral neuropathy (HCC) 01/12/2019   GERD (gastroesophageal reflux disease)    H/O hiatal hernia    Headache    "  at least 2/month" (01/16/2018)   Heart murmur    Hyperlipidemia    Hypertension    Memory change 05/14/2014   Migraine    "5-6/year"  (01/16/2018)   Obesity 01-2010   Obstructive sleep apnea    "can't wear machine; I have claustrophobia" (01/16/2018), states she had a 2nd sleep study and does not have sleep apnea, her O2 decreases and now is on 2 L of O2 at night.   On home oxygen therapy    "2L at night and prn during daytime" (01/16/2018)   Osteoarthritis    "knees and hands" (01/16/2018)   Peripheral vascular disease (HCC)    ? numbness, tingling arms and legs   PONV (postoperative nausea and vomiting)    Stroke (HCC)    2014, 2015, 2016   Type II diabetes mellitus (HCC)    Ventral hernia    hx of    Medications:  Scheduled:   ALPRAZolam  0.5 mg Oral Q lunch   And   ALPRAZolam  1 mg Oral QHS   buPROPion  300 mg Oral Daily   clopidogrel  75 mg Oral q morning   cyanocobalamin  1,000 mcg Oral  q morning   DULoxetine  30 mg Oral Daily   ezetimibe  10 mg Oral QHS   gabapentin  600 mg Oral q morning   And   gabapentin  1,200 mg Oral QHS   insulin aspart  0-15 Units Subcutaneous TID WC   insulin aspart  0-5 Units Subcutaneous QHS   mometasone-formoterol  2 puff Inhalation BID   ondansetron  4 mg Oral BID   pantoprazole  40 mg Oral Daily   ranolazine  1,000 mg Oral BID   rosuvastatin  40 mg Oral Daily   sodium chloride flush  3 mL Intravenous Q12H   sodium chloride flush  3 mL Intravenous Q12H   traZODone  100 mg Oral QHS    Assessment: 77 y.o. female with a history of afib and on Eliquis PTA. Last dose of Eliquis was 9/18 at 1000. She has been admitted with a plan to return to cath lab on Monday for cutting balloon angioplasty to ISR in the RCA.   Heparin level remained supratherapeutic at 0.79 on 1000 units/hr. Hgb stable at 12.1 and PLT decreased from 154 to 107. No issues with infusion and no new bleeding noted per RN.   9/22 PM update: HL 0.68- therapeutic No signs of bleeding or issues with the hep gtt  Goal of Therapy:  Heparin level 0.3-0.7 units/ml Monitor platelets by anticoagulation protocol: Yes   Plan:  Continue heparin infusion at 900 units/hr  Check confirmatory heparin level with AM labs 9/23 Check heparin level and CBC daily while on heparin  Monitor for s/sx of bleeding   Greta Doom BS, PharmD, BCPS Clinical Pharmacist 10/24/2022 6:33 PM  Contact: [phone] after 3 PM  "Be curious, not judgmental..." -Debbora Dus

## 2022-10-24 NOTE — Progress Notes (Signed)
Brief Cardiology Note:  CT AP showed large subcutaneous hematoma within the R groin extending into the proximal R thigh anteromedially. No extension of hemorrhage into peritoneum or retroperitoneum. Evaluated patient at bedside. She is mentating well, but has pain with movement of her R leg.  BP 77/36 -> 99/41. HR 58, NSR  Plan: - Will place Femstop. Transfer to Cardiac ICU because cannot do femstop on 6E - Type and screen - H/H. Will plan to transfuse if >2 pt drop in Hgb - Patient and family understand the plan - stopped IV heparin (indication was afib)  Willette Alma MD MPH [facility]

## 2022-10-24 NOTE — Progress Notes (Signed)
eLink Physician-Brief Progress Note Patient Name: [name] [name] DOB: 08/15/1945 [name]: [id number]   Date of Service  10/24/2022  HPI/Events of Note  77 year old who initially presented with unstable angina and underwent diagnostic catheterization with PCI to the proximal RCA but developed post catheterization hypotension with acute kidney injury.  Patient was fatigued, dizzy and hypotensive with bradycardia with right groin hematoma.  Heparin drip was stopped with anticipation of potential transfusion.  Repeat hemoglobin 11.5 from 12.1.  CT abdomen/pelvis with renal cortical infarct in the left.  eICU Interventions  Home meds been resumed and Dual antiplatelet therapy  Given hypotension, could consider holding ranolazine as well.  Will defer this to cardiology.  No indication for transfusion.  FemoStop in place.  Already needed in/out catheterization.  Continued urinary retention.  Will add Foley catheter.  DVT prophylaxis with SCDs GI prophylaxis with home pantoprazole.     Intervention Category Evaluation Type: New Patient Evaluation  [name] 10/24/2022, 9:43 PM

## 2022-10-25 ENCOUNTER — Inpatient Hospital Stay (HOSPITAL_COMMUNITY): Payer: Medicare Other

## 2022-10-25 DIAGNOSIS — I200 Unstable angina: Secondary | ICD-10-CM | POA: Diagnosis not present

## 2022-10-25 LAB — GLUCOSE, CAPILLARY
Glucose-Capillary: 100 mg/dL — ABNORMAL HIGH (ref 70–99)
Glucose-Capillary: 111 mg/dL — ABNORMAL HIGH (ref 70–99)
Glucose-Capillary: 103 mg/dL — ABNORMAL HIGH (ref 70–99)
Glucose-Capillary: 93 mg/dL (ref 70–99)
Glucose-Capillary: 126 mg/dL — ABNORMAL HIGH (ref 70–99)

## 2022-10-25 LAB — CBC
HCT: 34 % — ABNORMAL LOW (ref 36.0–46.0)
Hemoglobin: 11.2 g/dL — ABNORMAL LOW (ref 12.0–15.0)
MCH: 31.1 pg (ref 26.0–34.0)
MCHC: 32.9 g/dL (ref 30.0–36.0)
MCV: 94.4 fL (ref 80.0–100.0)
Platelets: 106 10*3/uL — ABNORMAL LOW (ref 150–400)
RBC: 3.6 MIL/uL — ABNORMAL LOW (ref 3.87–5.11)
RDW: 13 % (ref 11.5–15.5)
WBC: 6.5 10*3/uL (ref 4.0–10.5)
nRBC: 0 % (ref 0.0–0.2)

## 2022-10-25 LAB — BASIC METABOLIC PANEL
Anion gap: 8 (ref 5–15)
BUN: 42 mg/dL — ABNORMAL HIGH (ref 8–23)
CO2: 26 mmol/L (ref 22–32)
Calcium: 8.1 mg/dL — ABNORMAL LOW (ref 8.9–10.3)
Chloride: 102 mmol/L (ref 98–111)
Creatinine, Ser: 2.51 mg/dL — ABNORMAL HIGH (ref 0.44–1.00)
GFR, Estimated: 20 mL/min — ABNORMAL LOW (ref >60)
Glucose, Bld: 101 mg/dL — ABNORMAL HIGH (ref 70–99)
Potassium: 4.1 mmol/L (ref 3.5–5.1)
Sodium: 136 mmol/L (ref 135–145)

## 2022-10-25 LAB — URINALYSIS, ROUTINE W REFLEX MICROSCOPIC
Bilirubin Urine: NEGATIVE
Glucose, UA: NEGATIVE mg/dL
Hgb urine dipstick: NEGATIVE
Ketones, ur: 5 mg/dL — AB
Nitrite: NEGATIVE
Protein, ur: NEGATIVE mg/dL
Specific Gravity, Urine: 1.019 (ref 1.005–1.030)
pH: 5 (ref 5.0–8.0)

## 2022-10-25 LAB — CBC WITH DIFFERENTIAL/PLATELET
Abs Immature Granulocytes: 0.01 10*3/uL (ref 0.00–0.07)
Basophils Absolute: 0 10*3/uL (ref 0.0–0.1)
Basophils Relative: 0 %
Eosinophils Absolute: 0.1 10*3/uL (ref 0.0–0.5)
Eosinophils Relative: 2 %
HCT: 34.1 % — ABNORMAL LOW (ref 36.0–46.0)
Hemoglobin: 11.1 g/dL — ABNORMAL LOW (ref 12.0–15.0)
Immature Granulocytes: 0 %
Lymphocytes Relative: 12 %
Lymphs Abs: 0.7 10*3/uL (ref 0.7–4.0)
MCH: 31 pg (ref 26.0–34.0)
MCHC: 32.6 g/dL (ref 30.0–36.0)
MCV: 95.3 fL (ref 80.0–100.0)
Monocytes Absolute: 0.5 10*3/uL (ref 0.1–1.0)
Monocytes Relative: 8 %
Neutro Abs: 4.6 10*3/uL (ref 1.7–7.7)
Neutrophils Relative %: 78 %
Platelets: 108 10*3/uL — ABNORMAL LOW (ref 150–400)
RBC: 3.58 MIL/uL — ABNORMAL LOW (ref 3.87–5.11)
RDW: 13.1 % (ref 11.5–15.5)
WBC: 6 10*3/uL (ref 4.0–10.5)
nRBC: 0 % (ref 0.0–0.2)

## 2022-10-25 LAB — HEPARIN LEVEL (UNFRACTIONATED): Heparin Unfractionated: 0.37 IU/mL (ref 0.30–0.70)

## 2022-10-25 LAB — LIPOPROTEIN A (LPA): Lipoprotein (a): 229 nmol/L — ABNORMAL HIGH (ref <75.0)

## 2022-10-25 LAB — MAGNESIUM: Magnesium: 2.2 mg/dL (ref 1.7–2.4)

## 2022-10-25 MED ORDER — HEPARIN (PORCINE) 25000 UT/250ML-% IV SOLN
800.0000 [IU]/h | INTRAVENOUS | Status: DC
  Administered 2022-10-25: 800 [IU]/h via INTRAVENOUS
  Filled 2022-10-25: qty 250

## 2022-10-25 NOTE — Progress Notes (Signed)
ANTICOAGULATION CONSULT NOTE- follow-up Pharmacy Consult for Heparin Indication: atrial fibrillation  Allergies  Allergen Reactions   Amoxicillin Shortness Of Breath and Rash   Brilinta [Ticagrelor] Shortness Of Breath   Erythromycin Shortness Of Breath, Other (See Comments) and Hives    Trouble swallowing   Flagyl [Metronidazole] Shortness Of Breath and Palpitations   Penicillins Hives, Shortness Of Breath, Rash and Other (See Comments)    Has patient had a PCN reaction causing immediate rash, facial/tongue/throat swelling, SOB or lightheadedness with hypotension: Yes Has patient had a PCN reaction causing severe rash involving mucus membranes or skin necrosis: No Has patient had a PCN reaction that required hospitalization: Yes Has patient had a PCN reaction occurring within the last 10 years: No If all of the above answers are "NO", then may proceed with Cephalosporin use.    Isosorbide Mononitrate [Isosorbide Nitrate] Other (See Comments)    Joint aches, muscles hurt, difficult to walk    Jardiance [Empagliflozin] Other (See Comments)    Nausea, joint aches, muscles aches   Metformin And Related Other (See Comments)    Stomach pain, cold sweats, joint pain, burred vision, dizziness   Tape Other (See Comments)    Skin pulls off with certain types Plastic tape causes skin to rip if left on for long periods of time    Patient Measurements: Height: 5\' 2"  (157.5 cm) Weight: 99.1 kg (218 lb 7.6 oz) IBW/kg (Calculated) : 50.1 Heparin Dosing Weight:  75 kg  Vital Signs: Temp: 97.8 F (36.6 C) (09/23 0745) Temp Source: Axillary (09/23 0745) BP: 100/30 (09/23 0914) Pulse Rate: 48 (09/23 0914)  Labs: Recent Labs    10/22/22 1717 10/22/22 1749 10/22/22 1749 10/23/22 0441 10/23/22 1347 10/23/22 2116 10/24/22 0512 10/24/22 1737 10/24/22 2053 10/25/22 0258  HGB 14.4 15.4*  --  13.8  --   --  12.1  --  11.5* 11.2*  HCT 42.8 45.7  --  41.2  --   --  37.2  --  34.7* 34.0*   PLT 139* 143*  --  154  --   --  107*  --  114* 106*  APTT  --   --   --  26 73*  --   --   --   --   --   HEPARINUNFRC  --   --    < > 0.25* 0.67 0.72* 0.79* 0.68  --   --   CREATININE 0.99 0.83  --  1.18*  --   --  2.72*  --   --  2.51*  TROPONINIHS 15 15  --   --   --   --   --   --   --   --    < > = values in this interval not displayed.    Estimated Creatinine Clearance: 21.6 mL/min (A) (by C-G formula based on SCr of 2.51 mg/dL (H)).   Medical History: Past Medical History:  Diagnosis Date   Anemia    hx   Anxiety    Asthma    Basal cell carcinoma 05/2014   "left shoulder"   Bundle branch block, left    chronic/notes 07/18/2013   CHF (congestive heart failure) (HCC)    Chronic insomnia 05/06/2015   Chronic kidney disease    frequency, sees dr Camille Bal every 4 to 6 months (01/16/2018)   Chronic lower back pain    Claustrophobia    Common migraine 05/14/2014   Coronary artery disease    MI in  2001, 2002, 2006, 2011, 2014   Depression    Diabetic peripheral neuropathy (HCC) 01/12/2019   GERD (gastroesophageal reflux disease)    H/O hiatal hernia    Headache    "at least 2/month" (01/16/2018)   Heart murmur    Hyperlipidemia    Hypertension    Memory change 05/14/2014   Migraine    "5-6/year"  (01/16/2018)   Obesity 01-2010   Obstructive sleep apnea    "can't wear machine; I have claustrophobia" (01/16/2018), states she had a 2nd sleep study and does not have sleep apnea, her O2 decreases and now is on 2 L of O2 at night.   On home oxygen therapy    "2L at night and prn during daytime" (01/16/2018)   Osteoarthritis    "knees and hands" (01/16/2018)   Peripheral vascular disease (HCC)    ? numbness, tingling arms and legs   PONV (postoperative nausea and vomiting)    Stroke (HCC)    2014, 2015, 2016   Type II diabetes mellitus (HCC)    Ventral hernia    hx of    Medications:  Scheduled:   ALPRAZolam  0.5 mg Oral Q lunch   And   ALPRAZolam  1 mg  Oral QHS   buPROPion  300 mg Oral Daily   Chlorhexidine Gluconate Cloth  6 each Topical Daily   clopidogrel  75 mg Oral q morning   cyanocobalamin  1,000 mcg Oral q morning   DULoxetine  30 mg Oral Daily   ezetimibe  10 mg Oral QHS   gabapentin  600 mg Oral q morning   And   gabapentin  1,200 mg Oral QHS   insulin aspart  0-15 Units Subcutaneous TID WC   insulin aspart  0-5 Units Subcutaneous QHS   mometasone-formoterol  2 puff Inhalation BID   ondansetron  4 mg Oral BID   pantoprazole  40 mg Oral Daily   rosuvastatin  40 mg Oral Daily   sodium chloride flush  3 mL Intravenous Q12H   traZODone  100 mg Oral QHS    Assessment: 77 y.o. female with a history of afib and on Eliquis PTA. Last dose of Eliquis was 9/18 at 1000. She has been admitted with a plan to return to cath lab on Monday for cutting balloon angioplasty to ISR in the RCA.   Heparin held yesterday for groin hematoma post cath > now improved > resume heparin at lower dose and keep lower goal  Hgb stable at 11 and PLT stable 100s.    Goal of Therapy:  Heparin level 0.3-0.5 Monitor platelets by anticoagulation protocol: Yes   Plan:  Resume heparin drip at 800 uts/hr  Heparin level in 6hr  Daily heparin level and CBC  Monitor s/s bleeding    Leota Sauers Pharm.D. CPP, BCPS Clinical Pharmacist [phone] 10/25/2022 9:41 AM

## 2022-10-25 NOTE — Progress Notes (Addendum)
Advanced Heart Failure Rounding Note  PCP-Cardiologist: Lance Muss, MD   Subjective:   9/22: transferred to [facility] with R groin hematoma, unable to place femstop on floor. Hep gtt held/  Hematoma site stable this morning.   Feels good this morning. Minimal chest discomfort  Objective:   Weight Range: 99.1 kg Body mass index is 39.96 kg/m.   Vital Signs:   Temp:  [97.3 F (36.3 C)-97.8 F (36.6 C)] 97.8 F (36.6 C) (09/23 0745) Pulse Rate:  [41-53] 48 (09/23 0900) Resp:  [11-18] 14 (09/23 0900) BP: (75-130)/(32-78) 106/39 (09/23 0900) SpO2:  [91 %-99 %] 98 % (09/23 0900) Weight:  [99.1 kg] 99.1 kg (09/23 0422)    Weight change: Filed Weights   10/23/22 0327 10/24/22 0549 10/25/22 0422  Weight: 102.9 kg 102.5 kg 99.1 kg    Intake/Output:   Intake/Output Summary (Last 24 hours) at 10/25/2022 0915 Last data filed at 10/25/2022 0600 Gross per 24 hour  Intake 819.96 ml  Output 510 ml  Net 309.96 ml    Physical Exam    General:  well appearing.  No respiratory difficulty HEENT: normal Neck: supple. JVD ~6 cm. Carotids 2+ bilat; no bruits. No lymphadenopathy or thyromegaly appreciated. Cor: PMI nondisplaced. Brady rate & regular rhythm. No rubs, gallops or murmurs. Lungs: clear Abdomen: soft, nontender, nondistended. No hepatosplenomegaly. No bruits or masses. Good bowel sounds. Extremities: no cyanosis, clubbing, rash, edema. R groin ecchymosis, flat GU: foley Neuro: alert & oriented x 3, cranial nerves grossly intact. moves all 4 extremities w/o difficulty. Affect pleasant.   Telemetry   SB 40s (Personally reviewed)    EKG    No new EKG to review  Labs    CBC Recent Labs    10/22/22 1749 10/23/22 0441 10/24/22 2053 10/25/22 0258  WBC 6.3   < > 7.4 6.5  NEUTROABS 4.3  --   --   --   HGB 15.4*   < > 11.5* 11.2*  HCT 45.7   < > 34.7* 34.0*  MCV 94.4   < > 94.0 94.4  PLT 143*   < > 114* 106*   < > = values in this interval not displayed.    Basic Metabolic Panel Recent Labs    65/78/46 1749 10/23/22 0441 10/24/22 0512 10/25/22 0258  NA 141   < > 136 136  K 3.4*   < > 4.1 4.1  CL 105   < > 101 102  CO2 26   < > 26 26  GLUCOSE 118*   < > 102* 101*  BUN 13   < > 37* 42*  CREATININE 0.83   < > 2.72* 2.51*  CALCIUM 9.0   < > 8.1* 8.1*  MG 2.1  --   --  2.2   < > = values in this interval not displayed.   Liver Function Tests Recent Labs    10/22/22 1717 10/22/22 1749  AST 18 19  ALT 17 20  ALKPHOS 47 50  BILITOT 1.1 1.1  PROT 5.9* 6.0*  ALBUMIN 3.5 3.6   No results for input(s): "LIPASE", "AMYLASE" in the last 72 hours. Cardiac Enzymes No results for input(s): "CKTOTAL", "CKMB", "CKMBINDEX", "TROPONINI" in the last 72 hours.  BNP: BNP (last 3 results) Recent Labs    10/01/22 1244 10/22/22 1717 10/22/22 1749  BNP 228.4* 126.2* 133.9*    ProBNP (last 3 results) No results for input(s): "PROBNP" in the last 8760 hours.   D-Dimer No results for  input(s): "DDIMER" in the last 72 hours. Hemoglobin A1C Recent Labs    10/22/22 1717  HGBA1C 6.1*   Fasting Lipid Panel No results for input(s): "CHOL", "HDL", "LDLCALC", "TRIG", "CHOLHDL", "LDLDIRECT" in the last 72 hours. Thyroid Function Tests Recent Labs    10/22/22 1759  TSH 0.750    Other results:   Imaging    CT ABDOMEN PELVIS WO CONTRAST  Result Date: 10/24/2022 CLINICAL DATA:  Retroperitoneal hemorrhage, right groin hematoma EXAM: CT ABDOMEN AND PELVIS WITHOUT CONTRAST TECHNIQUE: Multidetector CT imaging of the abdomen and pelvis was performed following the standard protocol without IV contrast. RADIATION DOSE REDUCTION: This exam was performed according to the departmental dose-optimization program which includes automated exposure control, adjustment of the mA and/or kV according to patient size and/or use of iterative reconstruction technique. COMPARISON:  01/12/2022 FINDINGS: Lower chest: No acute abnormality. Hepatobiliary: Status  post cholecystectomy. Stable moderate extrahepatic biliary ductal dilation likely representing post cholecystectomy change. No intrahepatic biliary ductal dilation. The liver is unremarkable on this noncontrast examination. Pancreas: Unremarkable Spleen: Unremarkable Adrenals/Urinary Tract: The adrenal glands are unremarkable. The right kidney is normal in size and position. Asymmetric left renal cortical atrophy. Interval development of a cortical infarct involving the upper pole and posterior interpolar region of the left kidney. Stable 20 mm largely lipomatous angiomyolipoma within the upper pole of the left kidney for which no follow-up imaging is recommended. Normal cortical enhancement of the right kidney. No hydronephrosis. No intrarenal or ureteral calculi. The bladder is unremarkable. Stomach/Bowel: Surgical staple lines are seen involving the stomach and mid small bowel which may relate to prior gastric bypass and subsequent takedown. Mild sigmoid diverticulosis. The stomach, small bowel, and large bowel are otherwise unremarkable. No evidence of obstruction or focal inflammation. No free intraperitoneal gas or fluid. The appendix is normal. Vascular/Lymphatic: Extensive aortoiliac atherosclerotic calcification. Particularly prominent atherosclerotic calcification of the origin of the superior mesenteric and inferior mesenteric arteries. The degree of stenosis is not well assessed on this non arteriographic study. No aortic aneurysm. No pathologic adenopathy within the abdomen and pelvis. Reproductive: Status post hysterectomy. No adnexal masses. Other: No retroperitoneal hematoma identified. No abdominal wall hernia. Infiltrative high attenuation fluid is seen within the right groin extending into the proximal right thigh anteromedially, incompletely included on this examination in keeping with a large subcutaneous hematoma. Subcutaneous hemorrhage does not extend into the peritoneum or retroperitoneum.  Musculoskeletal: No acute bone abnormality. Osseous structures are age-appropriate. IMPRESSION: 1. Large subcutaneous hematoma within the right groin extending into the proximal right thigh anteromedially, incompletely included on this examination. No extension of subcutaneous hemorrhage into the peritoneum or retroperitoneum. 2. Interval development of a cortical infarct involving the upper pole and posterior interpolar region of the left kidney. 3. Extensive aortoiliac atherosclerotic calcification. Particularly prominent atherosclerotic calcification of the origin of the superior mesenteric and inferior mesenteric arteries. The degree of stenosis is not well assessed on this non arteriographic study. 4. Mild sigmoid diverticulosis. Aortic Atherosclerosis (ICD10-I70.0). Electronically Signed   By: Helyn Numbers M.D.   On: 10/24/2022 19:53     Medications:     Scheduled Medications:  ALPRAZolam  0.5 mg Oral Q lunch   And   ALPRAZolam  1 mg Oral QHS   buPROPion  300 mg Oral Daily   Chlorhexidine Gluconate Cloth  6 each Topical Daily   clopidogrel  75 mg Oral q morning   cyanocobalamin  1,000 mcg Oral q morning   DULoxetine  30 mg Oral Daily  ezetimibe  10 mg Oral QHS   gabapentin  600 mg Oral q morning   And   gabapentin  1,200 mg Oral QHS   insulin aspart  0-15 Units Subcutaneous TID WC   insulin aspart  0-5 Units Subcutaneous QHS   mometasone-formoterol  2 puff Inhalation BID   ondansetron  4 mg Oral BID   pantoprazole  40 mg Oral Daily   rosuvastatin  40 mg Oral Daily   sodium chloride flush  3 mL Intravenous Q12H   traZODone  100 mg Oral QHS    Infusions:  sodium chloride 50 mL/hr at 10/24/22 1318   sodium chloride      PRN Medications: sodium chloride, acetaminophen, albuterol, bisacodyl, loperamide, nitroGLYCERIN, ondansetron (ZOFRAN) IV, oxyCODONE, sodium chloride flush    Patient Profile   ERNESTYNE KRENZKE is a 77 y.o. female who has a history of CAD s/p CABG,  diastolic CHF, and cerebrovascular disease with history of CVA. She had CABG x 5 in 12/11.  Prior to the CABG she had multiple PCIs.  She had a CVA in 2/14 that presented as visual loss.  She had a left carotid stent in 2/15. AHF team admitted after LHC/RHC showed mildly elevated PCWP with normal RA pressure, mixed pulmonary venous/pulmonary arterial hypertension, and 99% in-stent restenosis in the proximal RCA, at least 2 layers of stent are present. Admitted for PCI of prox RCA.   Assessment/Plan  1. CAD: CABG x 5 in 12/11.  Known occluded SVG-RCA.  Status post opening of CTO RCA with 4 overlapping Xience DES in 5/15.  DES to sequential SVG-OM1/PLOM in 9/19. LHC in 3/23 showed severe native coronary disease with 90% in-stent restenosis in the proximal RCA (patient has 2 layers of stent at this site). The sequential SVG-OM1/PLOM was patent, SVG-D was patent, and LIMA-LAD was patent. She underwent PTCA of pRCA in 3/23. 8/24 echo showed EF further reduced, now 30-35% with dyspnea and chest tightness walking around the house.  Worry for unstable angina based on progressive chest pain in last few days.  Cath today showed 99% in-stent restenosis in the proximal RCA (known occluded SVG-RCA).  Other coronary disease was stable.   - Dr. Shirlee Latch discussed with Dr. Herbie Baltimore, planned for cutting balloon angioplasty to the proximal RCA on Monday.  May need to push back with hypotension and AKI - HsTrop 15>15  - Continue apixaban and Plavix long-term (has been on chronic Plavix, neuro has wanted her to stay on this for cerebrovascular disease).  Will continue Plavix today, continue to hold Eliquis and start heparin gtt in the morning (atrial fibrillation). - Continue ranolazine 1000 mg bid.  - Off Imdur with headache. - Off Toprol XL with bradycardia.   2. Chronic systolic CHF: Echo in 1/19 with EF 55-60%, moderate diastolic dysfunction. Cardiomems placed 01/16/18.  Echo in 3/20 in setting of severe PNA with intubation  showed EF 25-30%, mid-apical LV severe hypokinesis.  Possible stress (Takotsubo-type) cardiomyopathy related to severe medical illness versus progression of CAD.  She has baseline chronic LBBB which could play a role in cardiomyopathy as well (LBBB cardiomyopathy).  Repeat echo in 11/20 with EF up to 45-50%, moderately dysfunctional RV.  TEE in 12/20 with EF 50-55% with septal-lateral dyssynchrony and mildly dysfunctional RV.  Echo in 11/22 with EF 55% with normal RV.  cMRI in 1/23 with EF 48% and evidence for prior inferior MI.  Echo in 8/24 with fall in EF to 30-35%, normal RV.  Fall may be due  to severe RCA disease.  Ongoing NYHA class III-IIIb symptoms.  Mildly elevated PCWP on cath with normal RA pressure.  - Hold Cleda Daub and Wales with AKI - Hold off on restarting daily torsemide 10 mg daily. Weight down and renal function still elevated. Volume appears stable today. - Cannot tolerate SGLT2i. - Not on beta blocker due to bradycardia.  - May need norepi for inotropic support, check doppler.  - With significant reduction in her EF, her LBBB is wide and she may benefit from CRT if EF does not improve with coronary intervention.  3. Hyperlipidemia: She remains on Zetia, Crestor and Leqvio.  - Followed in lipid clinic. 4. Hypertension:  - Hypotensive today? Plan to check doppler. May need norepi.  - Now off amlodipine 5. Cerebrovascular disease: She has history of CVA and had left carotid stent in 2/15. Followed by [facility].  Last MRA head showed severe stenosis of supraclinoid RICA and severe basilar artery stenosis.  - Neurology would like her to continue Plavix for this long-term despite concomitant apixaban use.  6. OHS/OSA: Cannot tolerate CPAP.  Uses oxygen at night.  7. Atrial fibrillation: Paroxysmal. 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 No recent palpitations, NSR today.  - Start heparin gtt today at lower dose.  Restart apixaban after intervention.  8.  Obesity: Body mass index is 43.76 kg/m. - She is now on Ozempic. 9. Mitral regurgitation: Mild-moderate on 8/24 echo.  10. Bradycardia: Now off Toprol XL.   11. Mitral annular mass: This is a well-circumscribed calcified posterior mitral annulus mass noted on 5/22 and 11/22 echoes.  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 12. Hematoma: post cath CTA showed large SQ hematoma at R groin. Now resolved.  - Heparin gtt stopped yesterday, plan to restart today at lower dose - monitor for bleeding 13. AKI  - 2/2 post-cath hypotension? - SCr peaked at 2.72 - 2.51 today - baseline 0.9-1 - holding diuretics, may need norepi to help with renal perfusion  Length of Stay: 3  Alen Bleacher, NP  10/25/2022, 9:15 AM  Advanced Heart Failure Team Pager [phone] (M-F; 7a - 5p)  Please contact [facility] for night-coverage after hours (5p -7a ) and weekends on [email or web address]  Patient seen and examined with the above-signed Advanced Practice Provider and/or Housestaff. I personally reviewed laboratory data, imaging studies and relevant notes. I independently examined the patient and formulated the important aspects of the plan. I have edited the note to reflect any of my changes or salient points. I have personally discussed the plan with the patient and/or family.  Moved to ICU overnight due to groin hematoma. Hgb stable this am.   SBP ~ 100 checked by Doppler. Feels weak. R groin sore. Chest heavy but no fran angina. Scr improving  General:  Lying flat in bed. Weak appearing. No resp difficulty HEENT: normal Neck: supple. no JVD. Carotids 2+ bilat; no bruits. No lymphadenopathy or thryomegaly appreciated. Cor: PMI nondisplaced. Regular rate & rhythm. 2/6 AS Lungs: clear Abdomen: obese  soft, nontender, nondistended. No hepatosplenomegaly. No bruits or masses. Good bowel sounds. Extremities: no cyanosis, clubbing, rash, edema R groin ecchymosis  no palpable hematoma. No bruit    Neuro: alert &  orientedx3, cranial nerves grossly intact. moves all 4 extremities w/o difficulty. Affect pleasant  Groin bleed seems to have stabilized. BP soft by stable Scr improving. Will keep in ICU one more day to follow. May need arterial line to follow SBP more  closely.   Case d/w Thukkani and team C will assume care. Given AKI likely defer PCI for now.   Arvilla Meres, MD  3:05 PM

## 2022-10-25 NOTE — Plan of Care (Signed)
Right groin site Level 2, soft w/bruising.  Will continue to monitor for any changes.

## 2022-10-25 NOTE — Progress Notes (Signed)
Heart Failure Navigator Progress Note  Assessed for Heart & Vascular TOC clinic readiness.  Patient does not meet criteria due to Advanced Heart Failure Team consulted. .   Navigator will sign off at this time.   Dawn Fields, BSN, RN Heart Failure Nurse Navigator Secure Chat Only   

## 2022-10-25 NOTE — TOC Initial Note (Signed)
Transition of Care [facility]) - Initial/Assessment Note    Patient Details  Name: [name] [name] [name]: [id number] Date of Birth: [birth year]  Transition of Care [facility]) CM/SW Contact:    Elliot Cousin, RN Phone Number: [phone] 10/25/2022, 5:35 PM  Clinical Narrative:   HF TOC CM spoke to pt and states she has oxygen that is older tank that is noisy. States she would like to have new device. Pt has oxygen with [employer], will follow up to see if they can replace. Pt may need to qualify for newer DME oxygen concentrator. Will continue to follow as pt progresses.                 Expected Discharge Plan: Home w Home Health Services Barriers to Discharge: Continued Medical Work up   Patient Goals and CMS Choice Patient states their goals for this hospitalization and ongoing recovery are:: wants to get better CMS Medicare.gov Compare Post Acute Care list provided to:: Patient Choice offered to / list presented to : Patient      Expected Discharge Plan and Services   Discharge Planning Services: CM Consult Post Acute Care Choice: Home Health Living arrangements for the past 2 months: Single Family Home                                      Prior Living Arrangements/Services Living arrangements for the past 2 months: Single Family Home Lives with:: Spouse Patient language and need for interpreter reviewed:: Yes Do you feel safe going back to the place where you live?: Yes      Need for Family Participation in Patient Care: Yes (Comment) Care giver support system in place?: Yes (comment) Current home services: DME (rollator, scale, oxygen ([employer] [facility])) Criminal Activity/Legal Involvement Pertinent to Current Situation/Hospitalization: No - Comment as needed  Activities of Daily Living      Permission Sought/Granted Permission sought to share information with : Case Manager, Family Supports, PCP Permission granted to share information with : Yes, Verbal Permission  Granted  Share Information with NAME: Sherlean Salasar  Permission granted to share info w AGENCY: Home Health, DME  Permission granted to share info w Relationship: husband  Permission granted to share info w Contact Information: [phone]  Emotional Assessment Appearance:: Appears stated age Attitude/Demeanor/Rapport: Engaged Affect (typically observed): Accepting Orientation: : Oriented to Self, Oriented to Place, Oriented to  Time, Oriented to Situation   Psych Involvement: No (comment)  Admission diagnosis:  Unstable angina (HCC) [I20.0] Patient Active Problem List   Diagnosis Date Noted   Nausea and vomiting 03/30/2022   Gastroparesis 08/24/2021   Hemangioma 08/24/2021   History of dysplastic nevus 08/24/2021   Hyperglycemia due to type 2 diabetes mellitus (HCC) 08/24/2021   Lentigo 08/24/2021   Long term (current) use of insulin (HCC) 08/24/2021   Melanoma in situ of left upper arm (HCC) 08/24/2021   Seborrheic keratosis 08/24/2021   CAD (coronary artery disease) 04/20/2021   Astigmatism with presbyopia, bilateral 07/21/2020   Mild nonproliferative diabetic retinopathy of both eyes without macular edema associated with type 2 diabetes mellitus (HCC) 04/19/2019   Symptomatic bradycardia 01/16/2019   Normocytic anemia 01/16/2019   Diabetic peripheral neuropathy (HCC) 01/12/2019   Neurologic abnormality 12/25/2018   Debility 09/21/2018   Hypotension 09/18/2018   Bradycardia 09/18/2018   AKI (acute kidney injury) (HCC) 09/18/2018   Pressure injury of skin 09/18/2018  Ischemic cerebrovascular accident (CVA) of frontal lobe (HCC) 09/13/2018   Fall 09/09/2018   History of CVA (cerebrovascular accident) 09/09/2018   Type II diabetes mellitus with renal manifestations (HCC) 09/09/2018   CKD (chronic kidney disease), stage III 09/09/2018   Sepsis (HCC) 09/09/2018   Depression with anxiety 09/09/2018   Slurred speech 09/09/2018   Ankle fracture, left 05/16/2018   Amiodarone  pulmonary toxicity    Physical deconditioning    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 [facility])    Acute on chronic congestive heart failure (HCC)    Chronic combined systolic (congestive) and diastolic (congestive) heart failure (HCC) 01/16/2018   Chronic systolic CHF (congestive heart failure) (HCC) 01/16/2018   Atrial fibrillation (HCC) 10/21/2017   CAD (coronary artery disease) of bypass graft 10/20/2017   Coronary artery disease of bypass graft of native heart with stable angina pectoris (HCC)    Chronic low back pain 08/24/2016   Pain of left thumb 08/05/2016   Nocturnal hypoxia 06/02/2016   Hoarseness 04/05/2016   Laryngopharyngeal reflux (LPR) 04/05/2016   Pharyngoesophageal dysphagia 04/05/2016   Angina decubitus 05/22/2015   Chronic insomnia 05/06/2015   Common migraine 05/14/2014   Abnormality of gait 05/14/2014   Memory change 05/14/2014   Aneurysm, cerebral, nonruptured 05/14/2014   Cramp of limb-Left neck 05/10/2014   Hematemesis with nausea    Vomiting blood    Dizziness and giddiness 09/21/2013   Atypical chest pain 07/18/2013   Unstable angina (HCC) 04/09/2013   Carotid artery stenosis, symptomatic 03/20/2013   Cerebrovascular disease 07/10/2012   Cerebral artery occlusion with cerebral infarction (HCC) 07/10/2012   Mitral regurgitation 04/15/2012   TIA (transient ischemic attack) 04/15/2012   Occlusion and stenosis of carotid artery without mention of cerebral infarction 08/18/2011   Bariatric surgery status 06/17/2011   Speech abnormality 03/22/2011   Dyspnea 02/24/2011   PAPILLARY MUSCLE DYSFUNCTION, NON-RHEUMATIC 10/09/2008   UNSPECIFIED VITAMIN D DEFICIENCY 10/24/2007   MYOCARDIAL INFARCTION, HX OF 10/24/2007   PERSISTENT VOMITING 10/24/2007   OSTEOARTHRITIS 10/24/2007   MIGRAINES, HX OF 10/24/2007   Diabetes mellitus type 2, controlled, with complications (HCC) 01/16/2007   Hyperlipemia 01/16/2007   Severe obesity (BMI >= 40) (HCC) 01/16/2007   OBSTRUCTIVE SLEEP  APNEA 01/16/2007   Essential hypertension 01/16/2007   Coronary atherosclerosis 01/16/2007   Asthma 01/16/2007   GERD 01/16/2007   VENTRAL HERNIA 01/16/2007   PCP:  Mosetta Putt, MD Pharmacy:   [facility] - [place], [place] - [address] [place] [address] Phone: [phone] Fax: [phone]     Social Determinants of Health (SDOH) Social History: SDOH Screenings   Depression (PHQ2-9): High Risk (12/30/2021)  Tobacco Use: Low Risk  (10/01/2022)   SDOH Interventions:     Readmission Risk Interventions     No data to display

## 2022-10-25 NOTE — Progress Notes (Addendum)
   Patient Name: [name] [name] Date of Encounter: 10/25/2022 [facility] Cardiologist: Lance Muss, MD   Interval Summary  .    Transferred to 2H overnight due to expanding R groin hematoma>>CT with hematoma and w/o RP bleed.  CT also showed new L renal cortical infarction.  Patient without complaints today.  Cr down to 2.5; UOP 510.  Vital Signs .    Vitals:   10/25/22 0600 10/25/22 0615 10/25/22 0733 10/25/22 0745  BP: (!) 98/51     Pulse: (!) 41 (!) 41    Resp: 12 12    Temp:    97.8 F (36.6 C)  TempSrc:    Axillary  SpO2: 97% 99% 98%   Weight:      Height:        Intake/Output Summary (Last 24 hours) at 10/25/2022 0809 Last data filed at 10/25/2022 0600 Gross per 24 hour  Intake 1059.96 ml  Output 510 ml  Net 549.96 ml      10/25/2022    4:22 AM 10/24/2022    5:49 AM 10/23/2022    3:27 AM  Last 3 Weights  Weight (lbs) 218 lb 7.6 oz 225 lb 14.4 oz 226 lb 13.7 oz  Weight (kg) 99.1 kg 102.468 kg 102.9 kg      Telemetry/ECG    Sinus bradycardia 40s-50s- Personally Reviewed  CAD CABG x 5 in 2011.      Physical Exam .   GEN: No acute distress.   Neck: No JVD Cardiac: RRR, with 2/6 holosystolic murmur Respiratory: Clear to auscultation bilaterally. GI: Soft, nontender, non-distended  MS: No edema EXT:  Mild bruising R groin; TTP  Assessment & Plan .     77 year old with diagnostic cath on 10/22/2022 with planned PCI proximal RCA with Dr. Herbie Baltimore Monday, however post catheterization hypotension with AKI-creatinine 2.72 this morning, baseline 0.83.  AKI -UOP ~510 -Will check CBC with diff today for eosinophils; I suspect this was due to atheroembolism during coronary angiography.  Check UA, renal u/s; will obtain nephrology consult tomorrow if Cr now improving. -Cont to hold nephrotoxic agents. -Only 100cc IV dye used during coronary angiography case  UA -Reviewed coronary angiography; planned PTCA of RCA when due to ISR when Cr  improved. -Cont plavix for now.  No recurrent pain at rest.  Chronic systolic heart failure -EF 30% with mild to moderate MR -At home has been taking Entresto 97/103 twice daily, spironolactone 25 every day, torsemide 10 mg every day, amlodipine 5 mg daily.  Continue to hold meds given AKI and soft BP.  Paroxysmal atrial fibrillation - Eliquis currently on hold, cont IV heparin for now.  Prior stroke - Left carotid stent in 2015 followed by vascular. -She has as well supraclinoid R ICA and severe basilar artery stenosis.  Neurology would like her to continue with Plavix and aspirin.  Hematoma -BP 100s, monitor for now.    Bradycardia -Monitor for now.  Hold rate controlling agents.  OK to transfer out of unit.  For questions or updates, please contact [facility] Please consult [email or web address] for contact info under       Signed, Orbie Pyo, MD

## 2022-10-25 NOTE — Progress Notes (Signed)
ANTICOAGULATION CONSULT NOTE- follow-up Pharmacy Consult for Heparin Indication: atrial fibrillation  Allergies  Allergen Reactions   Amoxicillin Shortness Of Breath and Rash   Brilinta [Ticagrelor] Shortness Of Breath   Erythromycin Shortness Of Breath, Other (See Comments) and Hives    Trouble swallowing   Flagyl [Metronidazole] Shortness Of Breath and Palpitations   Penicillins Hives, Shortness Of Breath, Rash and Other (See Comments)    Has patient had a PCN reaction causing immediate rash, facial/tongue/throat swelling, SOB or lightheadedness with hypotension: Yes Has patient had a PCN reaction causing severe rash involving mucus membranes or skin necrosis: No Has patient had a PCN reaction that required hospitalization: Yes Has patient had a PCN reaction occurring within the last 10 years: No If all of the above answers are "NO", then may proceed with Cephalosporin use.    Isosorbide Mononitrate [Isosorbide Nitrate] Other (See Comments)    Joint aches, muscles hurt, difficult to walk    Jardiance [Empagliflozin] Other (See Comments)    Nausea, joint aches, muscles aches   Metformin And Related Other (See Comments)    Stomach pain, cold sweats, joint pain, burred vision, dizziness   Tape Other (See Comments)    Skin pulls off with certain types Plastic tape causes skin to rip if left on for long periods of time    Patient Measurements: Height: 5\' 2"  (157.5 cm) Weight: 99.1 kg (218 lb 7.6 oz) IBW/kg (Calculated) : 50.1 Heparin Dosing Weight:  75 kg  Vital Signs: Temp: 97.7 F (36.5 C) (09/23 1735) Temp Source: Oral (09/23 1735) BP: 110/33 (09/23 1800) Pulse Rate: 46 (09/23 1800)  Labs: Recent Labs    10/23/22 0441 10/23/22 1347 10/23/22 2116 10/24/22 0512 10/24/22 1737 10/24/22 2053 10/25/22 0258 10/25/22 0854 10/25/22 1754  HGB 13.8  --   --  12.1  --  11.5* 11.2* 11.1*  --   HCT 41.2  --   --  37.2  --  34.7* 34.0* 34.1*  --   PLT 154  --   --  107*  --   114* 106* 108*  --   APTT 26 73*  --   --   --   --   --   --   --   HEPARINUNFRC 0.25* 0.67   < > 0.79* 0.68  --   --   --  0.37  CREATININE 1.18*  --   --  2.72*  --   --  2.51*  --   --    < > = values in this interval not displayed.    Estimated Creatinine Clearance: 21.6 mL/min (A) (by C-G formula based on SCr of 2.51 mg/dL (H)).   Medical History: Past Medical History:  Diagnosis Date   Anemia    hx   Anxiety    Asthma    Basal cell carcinoma 05/2014   "left shoulder"   Bundle branch block, left    chronic/notes 07/18/2013   CHF (congestive heart failure) (HCC)    Chronic insomnia 05/06/2015   Chronic kidney disease    frequency, sees dr Camille Bal every 4 to 6 months (01/16/2018)   Chronic lower back pain    Claustrophobia    Common migraine 05/14/2014   Coronary artery disease    MI in 2001, 2002, 2006, 2011, 2014   Depression    Diabetic peripheral neuropathy (HCC) 01/12/2019   GERD (gastroesophageal reflux disease)    H/O hiatal hernia    Headache    "at least 2/month" (  01/16/2018)   Heart murmur    Hyperlipidemia    Hypertension    Memory change 05/14/2014   Migraine    "5-6/year"  (01/16/2018)   Obesity 01-2010   Obstructive sleep apnea    "can't wear machine; I have claustrophobia" (01/16/2018), states she had a 2nd sleep study and does not have sleep apnea, her O2 decreases and now is on 2 L of O2 at night.   On home oxygen therapy    "2L at night and prn during daytime" (01/16/2018)   Osteoarthritis    "knees and hands" (01/16/2018)   Peripheral vascular disease (HCC)    ? numbness, tingling arms and legs   PONV (postoperative nausea and vomiting)    Stroke (HCC)    2014, 2015, 2016   Type II diabetes mellitus (HCC)    Ventral hernia    hx of    Medications:  Scheduled:   ALPRAZolam  0.5 mg Oral Q lunch   And   ALPRAZolam  1 mg Oral QHS   buPROPion  300 mg Oral Daily   Chlorhexidine Gluconate Cloth  6 each Topical Daily   clopidogrel   75 mg Oral q morning   cyanocobalamin  1,000 mcg Oral q morning   DULoxetine  30 mg Oral Daily   ezetimibe  10 mg Oral QHS   gabapentin  600 mg Oral q morning   And   gabapentin  1,200 mg Oral QHS   insulin aspart  0-15 Units Subcutaneous TID WC   insulin aspart  0-5 Units Subcutaneous QHS   mometasone-formoterol  2 puff Inhalation BID   ondansetron  4 mg Oral BID   pantoprazole  40 mg Oral Daily   rosuvastatin  40 mg Oral Daily   sodium chloride flush  3 mL Intravenous Q12H   traZODone  100 mg Oral QHS    Assessment: 77 y.o. female with a history of afib and on Eliquis PTA. Last dose of Eliquis was 9/18 at 1000. She has been admitted with a plan to return to cath lab on Monday for cutting balloon angioplasty to ISR in the RCA.   Heparin held yesterday for groin hematoma post cath > now improved and heparin was resumed earlier today -heparin level= 0.37 on 800 units/hr  Goal of Therapy:  Heparin level 0.3-0.5 Monitor platelets by anticoagulation protocol: Yes   Plan:  -Continue heparin at 800 units/hr -Daily heparin level and CBC  Harland German, PharmD Clinical Pharmacist **Pharmacist phone directory can now be found on [email or web address] ([email or web address]).  Listed under [facility].

## 2022-10-26 DIAGNOSIS — I25118 Atherosclerotic heart disease of native coronary artery with other forms of angina pectoris: Secondary | ICD-10-CM | POA: Diagnosis not present

## 2022-10-26 LAB — BASIC METABOLIC PANEL
Anion gap: 10 (ref 5–15)
BUN: 40 mg/dL — ABNORMAL HIGH (ref 8–23)
CO2: 24 mmol/L (ref 22–32)
Calcium: 8.6 mg/dL — ABNORMAL LOW (ref 8.9–10.3)
Chloride: 102 mmol/L (ref 98–111)
Creatinine, Ser: 1.94 mg/dL — ABNORMAL HIGH (ref 0.44–1.00)
GFR, Estimated: 27 mL/min — ABNORMAL LOW (ref >60)
Glucose, Bld: 96 mg/dL (ref 70–99)
Potassium: 4.3 mmol/L (ref 3.5–5.1)
Sodium: 136 mmol/L (ref 135–145)

## 2022-10-26 LAB — CBC
HCT: 33.9 % — ABNORMAL LOW (ref 36.0–46.0)
Hemoglobin: 11.1 g/dL — ABNORMAL LOW (ref 12.0–15.0)
MCH: 30.7 pg (ref 26.0–34.0)
MCHC: 32.7 g/dL (ref 30.0–36.0)
MCV: 93.9 fL (ref 80.0–100.0)
Platelets: 104 10*3/uL — ABNORMAL LOW (ref 150–400)
RBC: 3.61 MIL/uL — ABNORMAL LOW (ref 3.87–5.11)
RDW: 12.7 % (ref 11.5–15.5)
WBC: 6.1 10*3/uL (ref 4.0–10.5)
nRBC: 0 % (ref 0.0–0.2)

## 2022-10-26 LAB — GLUCOSE, CAPILLARY
Glucose-Capillary: 108 mg/dL — ABNORMAL HIGH (ref 70–99)
Glucose-Capillary: 139 mg/dL — ABNORMAL HIGH (ref 70–99)
Glucose-Capillary: 120 mg/dL — ABNORMAL HIGH (ref 70–99)
Glucose-Capillary: 131 mg/dL — ABNORMAL HIGH (ref 70–99)

## 2022-10-26 LAB — HEPARIN LEVEL (UNFRACTIONATED): Heparin Unfractionated: 0.39 IU/mL (ref 0.30–0.70)

## 2022-10-26 MED ORDER — APIXABAN 5 MG PO TABS
5.0000 mg | ORAL_TABLET | Freq: Two times a day (BID) | ORAL | Status: DC
  Administered 2022-10-26 – 2022-10-30 (×9): 5 mg via ORAL
  Filled 2022-10-26 (×9): qty 1

## 2022-10-26 MED FILL — Lidocaine HCl Local Preservative Free (PF) Inj 1%: INTRAMUSCULAR | Qty: 30 | Status: AC

## 2022-10-26 MED FILL — Hydralazine HCl Inj 20 MG/ML: INTRAMUSCULAR | Qty: 1 | Status: AC

## 2022-10-26 NOTE — Progress Notes (Signed)
   Patient Name: [name] [name] Date of Encounter: 10/26/2022 [facility] Cardiologist: Lance Muss, MD   Interval Summary  .    Complaining of feeling weak and fatigued, otherwise doing okay.  No angina at rest.  No shortness of breath at rest.  Vital Signs .    Vitals:   10/26/22 0400 10/26/22 0500 10/26/22 0600 10/26/22 0747  BP: (!) 139/42 (!) 131/37 (!) 136/40   Pulse: (!) 54 (!) 51 (!) 52   Resp: 14 13 12    Temp:  98.2 F (36.8 C)    TempSrc:  Oral    SpO2: 95% 93% 94% 94%  Weight:  97 kg    Height:        Intake/Output Summary (Last 24 hours) at 10/26/2022 0810 Last data filed at 10/26/2022 0600 Gross per 24 hour  Intake 500.93 ml  Output 670 ml  Net -169.07 ml      10/26/2022    5:00 AM 10/25/2022    4:22 AM 10/24/2022    5:49 AM  Last 3 Weights  Weight (lbs) 213 lb 13.5 oz 218 lb 7.6 oz 225 lb 14.4 oz  Weight (kg) 97 kg 99.1 kg 102.468 kg      Telemetry/ECG    Sinus rhythm - Personally Reviewed  Physical Exam .   GEN: Morbidly obese woman, no acute distress.   Neck: No JVD Cardiac: RRR, 2/6 systolic murmur at the left lower sternal border Respiratory: Clear to auscultation bilaterally. GI: Soft, nontender, non-distended  MS: No edema, groin hematoma soft  Assessment & Plan .     1.  CAD with angina: Patient with complex coronary artery disease status post remote CABG with multiple PCI procedures, found to have severe in-stent restenosis in the proximal RCA where there are 2 layers of stent present.  Initial plan was for PTCA in staged fashion but this is now deferred with a groin hematoma and acute kidney injury.  Plan medical therapy.  Continue clopidogrel. 2.  Chronic systolic heart failure.  Most recent echocardiogram with LVEF 30 to 35%.  Spironolactone and Entresto are on hold in the setting of acute kidney injury.  No beta-blocker because of bradycardia.  Blood pressure has stabilized. 3.  Right groin hematoma: Appears stable,  hemoglobin stable at 11.1.  Tolerating heparin drip.  She has been on heparin now for greater than 72 hours without rebleeding.  Transition back to apixaban today. 4.  Acute kidney injury, now improving.  Creatinine initially normal, increased to 2.72, now trending back down at 1.94 today.  Again, would avoid PCI in the acute setting.  Plan: Transfer out of ICU today to a telemetry bed, slowly mobilize, stop IV heparin and transition back to apixaban, PT/OT.  For questions or updates, please contact [facility] Please consult [email or web address] for contact info under        Signed, Tonny Bollman, MD

## 2022-10-26 NOTE — Progress Notes (Signed)
ANTICOAGULATION CONSULT NOTE- follow-up Pharmacy Consult for Heparin > Eliquis Indication: atrial fibrillation  Allergies  Allergen Reactions   Amoxicillin Shortness Of Breath and Rash   Brilinta [Ticagrelor] Shortness Of Breath   Erythromycin Shortness Of Breath, Other (See Comments) and Hives    Trouble swallowing   Flagyl [Metronidazole] Shortness Of Breath and Palpitations   Penicillins Hives, Shortness Of Breath, Rash and Other (See Comments)    Has patient had a PCN reaction causing immediate rash, facial/tongue/throat swelling, SOB or lightheadedness with hypotension: Yes Has patient had a PCN reaction causing severe rash involving mucus membranes or skin necrosis: No Has patient had a PCN reaction that required hospitalization: Yes Has patient had a PCN reaction occurring within the last 10 years: No If all of the above answers are "NO", then may proceed with Cephalosporin use.    Isosorbide Mononitrate [Isosorbide Nitrate] Other (See Comments)    Joint aches, muscles hurt, difficult to walk    Jardiance [Empagliflozin] Other (See Comments)    Nausea, joint aches, muscles aches   Metformin And Related Other (See Comments)    Stomach pain, cold sweats, joint pain, burred vision, dizziness   Tape Other (See Comments)    Skin pulls off with certain types Plastic tape causes skin to rip if left on for long periods of time    Patient Measurements: Height: 5\' 2"  (157.5 cm) Weight: 97 kg (213 lb 13.5 oz) IBW/kg (Calculated) : 50.1 Heparin Dosing Weight:  75 kg  Vital Signs: Temp: 97.6 F (36.4 C) (09/24 1154) Temp Source: Oral (09/24 1154) BP: 129/42 (09/24 1300) Pulse Rate: 49 (09/24 1300)  Labs: Recent Labs    10/24/22 0512 10/24/22 1737 10/24/22 2053 10/25/22 0258 10/25/22 0854 10/25/22 1754 10/26/22 0348  HGB 12.1  --    < > 11.2* 11.1*  --  11.1*  HCT 37.2  --    < > 34.0* 34.1*  --  33.9*  PLT 107*  --    < > 106* 108*  --  104*  HEPARINUNFRC 0.79* 0.68   --   --   --  0.37 0.39  CREATININE 2.72*  --   --  2.51*  --   --  1.94*   < > = values in this interval not displayed.    Estimated Creatinine Clearance: 27.7 mL/min (A) (by C-G formula based on SCr of 1.94 mg/dL (H)).   Medical History: Past Medical History:  Diagnosis Date   Anemia    hx   Anxiety    Asthma    Basal cell carcinoma 05/2014   "left shoulder"   Bundle branch block, left    chronic/notes 07/18/2013   CHF (congestive heart failure) (HCC)    Chronic insomnia 05/06/2015   Chronic kidney disease    frequency, sees dr Camille Bal every 4 to 6 months (01/16/2018)   Chronic lower back pain    Claustrophobia    Common migraine 05/14/2014   Coronary artery disease    MI in 2001, 2002, 2006, 2011, 2014   Depression    Diabetic peripheral neuropathy (HCC) 01/12/2019   GERD (gastroesophageal reflux disease)    H/O hiatal hernia    Headache    "at least 2/month" (01/16/2018)   Heart murmur    Hyperlipidemia    Hypertension    Memory change 05/14/2014   Migraine    "5-6/year"  (01/16/2018)   Obesity 01-2010   Obstructive sleep apnea    "can't wear machine; I have claustrophobia" (01/16/2018),  states she had a 2nd sleep study and does not have sleep apnea, her O2 decreases and now is on 2 L of O2 at night.   On home oxygen therapy    "2L at night and prn during daytime" (01/16/2018)   Osteoarthritis    "knees and hands" (01/16/2018)   Peripheral vascular disease (HCC)    ? numbness, tingling arms and legs   PONV (postoperative nausea and vomiting)    Stroke (HCC)    2014, 2015, 2016   Type II diabetes mellitus (HCC)    Ventral hernia    hx of    Medications:  Scheduled:   ALPRAZolam  0.5 mg Oral Q lunch   And   ALPRAZolam  1 mg Oral QHS   buPROPion  300 mg Oral Daily   Chlorhexidine Gluconate Cloth  6 each Topical Daily   clopidogrel  75 mg Oral q morning   cyanocobalamin  1,000 mcg Oral q morning   DULoxetine  30 mg Oral Daily   ezetimibe  10 mg  Oral QHS   gabapentin  600 mg Oral q morning   And   gabapentin  1,200 mg Oral QHS   insulin aspart  0-15 Units Subcutaneous TID WC   insulin aspart  0-5 Units Subcutaneous QHS   mometasone-formoterol  2 puff Inhalation BID   ondansetron  4 mg Oral BID   pantoprazole  40 mg Oral Daily   rosuvastatin  40 mg Oral Daily   sodium chloride flush  3 mL Intravenous Q12H   traZODone  100 mg Oral QHS    Assessment: 77 y.o. female with a history of afib and on Eliquis PTA. Last dose of Eliquis was 9/18 at 1000. She has been admitted with a plan to return to cath lab on Monday for cutting balloon angioplasty to ISR in the RCA.   Heparin held yesterday for groin hematoma post cath > now improved and heparin was resumed earlier today -heparin level= 0.39 on 800 units/hr this AM  Goal of Therapy:  Heparin level 0.3-0.5 Monitor platelets by anticoagulation protocol: Yes   Plan:  No further plans for cath lab this admission, pharmacy asked to change IV Heparin back to po Eliquis. Stop IV heparin Start Eliquis 5 mg po BID.  Reece Leader, Colon Flattery, BCCP Clinical Pharmacist  10/26/2022 2:30 PM   [facility] pharmacy phone numbers are listed on [email or web address]

## 2022-10-26 NOTE — Plan of Care (Signed)
  Problem: Education: Goal: Understanding of CV disease, CV risk reduction, and recovery process will improve Outcome: Progressing   Problem: Cardiovascular: Goal: Ability to achieve and maintain adequate cardiovascular perfusion will improve Outcome: Progressing   Problem: Cardiovascular: Goal: Vascular access site(s) Level 0-1 will be maintained Outcome: Progressing   Problem: Fluid Volume: Goal: Ability to maintain a balanced intake and output will improve Outcome: Progressing   Problem: Metabolic: Goal: Ability to maintain appropriate glucose levels will improve Outcome: Progressing   Problem: Clinical Measurements: Goal: Will remain free from infection Outcome: Progressing

## 2022-10-27 ENCOUNTER — Encounter (HOSPITAL_COMMUNITY): Payer: Self-pay | Admitting: Cardiology

## 2022-10-27 DIAGNOSIS — I200 Unstable angina: Secondary | ICD-10-CM | POA: Diagnosis not present

## 2022-10-27 LAB — BASIC METABOLIC PANEL
Anion gap: 8 (ref 5–15)
BUN: 27 mg/dL — ABNORMAL HIGH (ref 8–23)
CO2: 26 mmol/L (ref 22–32)
Calcium: 8.5 mg/dL — ABNORMAL LOW (ref 8.9–10.3)
Chloride: 102 mmol/L (ref 98–111)
Creatinine, Ser: 1.24 mg/dL — ABNORMAL HIGH (ref 0.44–1.00)
GFR, Estimated: 46 mL/min — ABNORMAL LOW (ref >60)
Glucose, Bld: 118 mg/dL — ABNORMAL HIGH (ref 70–99)
Potassium: 4.5 mmol/L (ref 3.5–5.1)
Sodium: 136 mmol/L (ref 135–145)

## 2022-10-27 LAB — CBC
HCT: 33.3 % — ABNORMAL LOW (ref 36.0–46.0)
Hemoglobin: 11.1 g/dL — ABNORMAL LOW (ref 12.0–15.0)
MCH: 31 pg (ref 26.0–34.0)
MCHC: 33.3 g/dL (ref 30.0–36.0)
MCV: 93 fL (ref 80.0–100.0)
Platelets: 123 10*3/uL — ABNORMAL LOW (ref 150–400)
RBC: 3.58 MIL/uL — ABNORMAL LOW (ref 3.87–5.11)
RDW: 13 % (ref 11.5–15.5)
WBC: 7.2 10*3/uL (ref 4.0–10.5)
nRBC: 0 % (ref 0.0–0.2)

## 2022-10-27 LAB — GLUCOSE, CAPILLARY
Glucose-Capillary: 132 mg/dL — ABNORMAL HIGH (ref 70–99)
Glucose-Capillary: 179 mg/dL — ABNORMAL HIGH (ref 70–99)
Glucose-Capillary: 127 mg/dL — ABNORMAL HIGH (ref 70–99)
Glucose-Capillary: 130 mg/dL — ABNORMAL HIGH (ref 70–99)

## 2022-10-27 NOTE — Progress Notes (Signed)
Mobility Specialist Progress Note:   10/27/22 1100  Mobility  Activity Transferred from bed to chair  Level of Assistance Minimal assist, patient does 75% or more  Assistive Device Front wheel walker  Distance Ambulated (ft) 4 ft  Activity Response Tolerated well  Mobility Referral Yes  $Mobility charge 1 Mobility  Mobility Specialist Start Time (ACUTE ONLY) 1009  Mobility Specialist Stop Time (ACUTE ONLY) 1019  Mobility Specialist Time Calculation (min) (ACUTE ONLY) 10 min    Pre Mobility: 58 HR,  93% SpO2 During Mobility: 66 HR,  92% SpO2 Post Mobility:  61 HR, 91% SpO2  Pt received in bed, agreeable to mobility. MinA to stand from EOB. C/o being weak and groin incision being sore. Pt left in chair with call bell and all needs met.  D'Vante Earlene Plater Mobility Specialist Please contact via Special educational needs teacher or Rehab office at [phone]

## 2022-10-27 NOTE — Plan of Care (Signed)
Problem: Skin Integrity: Goal: Risk for impaired skin integrity will decrease Outcome: Progressing

## 2022-10-27 NOTE — Care Management Important Message (Signed)
Important Message  Patient Details  Name: [name] [name] [name]: [id number] Date of Birth: Nov 18, 1945   Important Message Given:  Yes - Medicare IM     Dorena Bodo 10/27/2022, 3:03 PM

## 2022-10-27 NOTE — TOC Progression Note (Signed)
Transition of Care [facility]) - Progression Note    Patient Details  Name: [name] [name] [name]: [id number] Date of Birth: [birth year]  Transition of [facility]) CM/SW Contact  Elliot Cousin, RN Phone Number: [phone] 10/27/2022, 4:19 PM  Clinical Narrative:   CM offered choice for [facility], pt agreeable to [facility] (medicare list placed on chart). Will need HHPT and HH RN for disease management orders with F2F.  Contacted [facility] rep, Eber Jones with new referral.      Expected Discharge Plan: Home w Home Health Services Barriers to Discharge: Continued Medical Work up  Expected Discharge Plan and Services   Discharge Planning Services: CM Consult Post Acute Care Choice: Home Health Living arrangements for the past 2 months: Single Family Home                             HH Agency:  ([facility]) Date [facility] Agency Contacted: 10/27/22 Time HH Agency Contacted: [phone] Representative spoke with at [facility] Agency: Eber Jones   Social Determinants of Health (SDOH) Interventions SDOH Screenings   Food Insecurity: No Food Insecurity (10/27/2022)  Housing: Low Risk  (10/27/2022)  Transportation Needs: No Transportation Needs (10/27/2022)  Utilities: Not At Risk (10/27/2022)  Depression (PHQ2-9): High Risk (12/30/2021)  Tobacco Use: Low Risk  (10/27/2022)    Readmission Risk Interventions     No data to display

## 2022-10-27 NOTE — Progress Notes (Addendum)
Patient Name: [name] [name] Date of Encounter: 10/27/2022 [facility] Cardiologist: Lance Muss, MD   Interval Summary  .    Sitting up in chair, better but continues to feel fatigued   Vital Signs .    Vitals:   10/26/22 2100 10/26/22 2116 10/26/22 2305 10/27/22 0325  BP:  (!) 125/52 (!) 121/43 (!) 115/41  Pulse: (!) 52 (!) 56 (!) 54 (!) 57  Resp: 18 15 17 18   Temp:   98.3 F (36.8 C) 98.3 F (36.8 C)  TempSrc:   Oral Oral  SpO2: 97% 95% 90% 92%  Weight:    100.8 kg  Height:        Intake/Output Summary (Last 24 hours) at 10/27/2022 1104 Last data filed at 10/27/2022 0300 Gross per 24 hour  Intake 130.91 ml  Output 1225 ml  Net -1094.09 ml      10/27/2022    3:25 AM 10/26/2022    5:00 AM 10/25/2022    4:22 AM  Last 3 Weights  Weight (lbs) 222 lb 3.6 oz 213 lb 13.5 oz 218 lb 7.6 oz  Weight (kg) 100.8 kg 97 kg 99.1 kg      Telemetry/ECG    Sinus Bradycardia - Personally Reviewed  Physical Exam .   GEN: No acute distress.   Neck: No JVD Cardiac: RRR, + systolic murmur, no rubs, or gallops.  Respiratory: Clear to auscultation bilaterally. GI: Soft, nontender, non-distended. Stable/soft groin hematoma  MS: No edema  Assessment & Plan .     CAD -- underwent outpatient cardiac cath and found to have severe ISR of the RCA with plans for PTCA in staged fashion. This has been deferred in the setting of right groin hematoma with hypotension and AKI -- continue medical therapy, on plavix (no ASA with need for [facility])  Chronic HFrEF -- LVEF of 30-35%, has been on GDMT PTA but now held in the setting of hypotension and AKI -- no BB with her continued bradycardia -- plan to add back spiro tomorrow  R groin hematoma -- developed hypotension and bradycardia, H/H remains stable -- has been transitioned from IV heparin to Eliquis and tolerating well  AKI -- improving, Cr down to 1.2 this morning -- follow BMET -- as noted above, no plans for PCI in  the acute setting   Paroxysmal Atrial fibrillation -- remains in sinus rhythm -- resumed on Eliquis 5mg  BID  Needs to work on mobility today, likely DC home tomorrow     For questions or updates, please contact [facility] Please consult [email or web address] for contact info under        Signed, Laverda Page, NP    ATTENDING ATTESTATION:  After conducting a review of all available clinical information with the care team, interviewing the patient, and performing a physical exam, I agree with the findings and plan described in this note.   GEN: No acute distress.   HEENT:  MMM, no JVD, no scleral icterus Cardiac: RRR, no murmurs, rubs, or gallops.  Respiratory: Clear to auscultation bilaterally. GI: Soft, nontender, non-distended  MS: No edema; No deformity. Neuro:  Nonfocal  Vasc: Moderate bruising right groin  Patient stable today.  Creatinine is improving.  She still has quite a bit of discomfort in her right groin which is limiting her mobility.  She has had no angina and she is breathing well.  Given her right groin hematoma and AKI I think the best thing to do is to defer PCI  during this hospitalization and have her return for procedure done from the radial approach.  I will discuss with Dr.McLean.  We will obtain a PT and OT consult in preparation for discharge.  Will add back spironolactone tomorrow depending on labs.  Alverda Skeans, MD Pager [phone]

## 2022-10-28 LAB — BASIC METABOLIC PANEL
Anion gap: 7 (ref 5–15)
BUN: 21 mg/dL (ref 8–23)
CO2: 27 mmol/L (ref 22–32)
Calcium: 8.8 mg/dL — ABNORMAL LOW (ref 8.9–10.3)
Chloride: 102 mmol/L (ref 98–111)
Creatinine, Ser: 1.22 mg/dL — ABNORMAL HIGH (ref 0.44–1.00)
GFR, Estimated: 47 mL/min — ABNORMAL LOW (ref >60)
Glucose, Bld: 124 mg/dL — ABNORMAL HIGH (ref 70–99)
Potassium: 4.6 mmol/L (ref 3.5–5.1)
Sodium: 136 mmol/L (ref 135–145)

## 2022-10-28 LAB — CBC
HCT: 34.1 % — ABNORMAL LOW (ref 36.0–46.0)
Hemoglobin: 11.2 g/dL — ABNORMAL LOW (ref 12.0–15.0)
MCH: 31 pg (ref 26.0–34.0)
MCHC: 32.8 g/dL (ref 30.0–36.0)
MCV: 94.5 fL (ref 80.0–100.0)
Platelets: 127 10*3/uL — ABNORMAL LOW (ref 150–400)
RBC: 3.61 MIL/uL — ABNORMAL LOW (ref 3.87–5.11)
RDW: 13.2 % (ref 11.5–15.5)
WBC: 8.6 10*3/uL (ref 4.0–10.5)
nRBC: 0 % (ref 0.0–0.2)

## 2022-10-28 LAB — GLUCOSE, CAPILLARY
Glucose-Capillary: 129 mg/dL — ABNORMAL HIGH (ref 70–99)
Glucose-Capillary: 192 mg/dL — ABNORMAL HIGH (ref 70–99)
Glucose-Capillary: 161 mg/dL — ABNORMAL HIGH (ref 70–99)
Glucose-Capillary: 147 mg/dL — ABNORMAL HIGH (ref 70–99)

## 2022-10-28 NOTE — Evaluation (Signed)
Occupational Therapy Evaluation Patient Details Name: [name] [name] [name]: [id number] DOB: July 29, 1945 Today's Date: 10/28/2022   History of Present Illness Pt is a 77 y.o. female admitted to [facility] 9/20 with unstable angina and underwent diagnostic catheterization with PCI to proximal RCA. Initial plan was for PTCA in staged fashion but this is now deferred due to a groin hematoma and AKI. PMH: CAD s/p CABG, diastolic CHF, and cerebrovascular disease with history of CVA   Clinical Impression   At baseline pt performs all ADLs Independent to Mod I and performs functional mobility Mod I with a Rollator. Pt now presents with decreased activity tolerance, generalized B UE weakness, decreased B shoulder AROM, decreased balance during functional tasks, and decreased safety and independence with functional tasks. Pt currently demonstrates ability to complete UB ADLs with Mod I to Min assist, LB ADLs with Mod to Max assist, and functional transfers/mobility with Min assist with a RW. Pt requiring increased time and frequent rest breaks during functional tasks. Pt reporting dizziness in standing with BP 150/79. All other VSS on RA throughout session. Pt will benefit from acute skilled OT services to address deficits outlined below, decrease caregiver burden, and increase safety and independence with functional tasks. Post acute discharge, pt will benefit from continued skilled OT services in the home to maximize rehab potential.      If plan is discharge home, recommend the following: A little help with walking and/or transfers;A lot of help with bathing/dressing/bathroom;Assistance with cooking/housework;Assist for transportation;Help with stairs or ramp for entrance    Functional Status Assessment  Patient has had a recent decline in their functional status and demonstrates the ability to make significant improvements in function in a reasonable and predictable amount of time.  Equipment Recommendations   BSC/3in1    Recommendations for Other Services       Precautions / Restrictions Precautions Precautions: Fall Restrictions Weight Bearing Restrictions: No      Mobility Bed Mobility Overal bed mobility: Needs Assistance             General bed mobility comments: Pt seated in recliner at beginning and end of session.    Transfers Overall transfer level: Needs assistance Equipment used: Rolling walker (2 wheels) Transfers: Sit to/from Stand, Bed to chair/wheelchair/BSC Sit to Stand: Min assist     Step pivot transfers: Min assist (with RW)     General transfer comment: cues for hand placement and technique      Balance Overall balance assessment: Needs assistance Sitting-balance support: No upper extremity supported, Single extremity supported, Feet supported Sitting balance-Leahy Scale: Fair     Standing balance support: Single extremity supported, Bilateral upper extremity supported, During functional activity, Reliant on assistive device for balance Standing balance-Leahy Scale: Poor Standing balance comment: reliant on RW for balance                           ADL either performed or assessed with clinical judgement   ADL Overall ADL's : Needs assistance/impaired Eating/Feeding: Modified independent;Sitting   Grooming: Contact guard assist;Sitting   Upper Body Bathing: Minimal assistance;Sitting   Lower Body Bathing: Moderate assistance;Sitting/lateral leans;Sit to/from stand   Upper Body Dressing : Contact guard assist;Sitting   Lower Body Dressing: Moderate assistance;Maximal assistance;Sitting/lateral leans;Sit to/from stand   Toilet Transfer: Minimal assistance;BSC/3in1;Rolling walker (2 wheels) (step-pivot transfer)   Toileting- Clothing Manipulation and Hygiene: Moderate assistance;Sitting/lateral lean;Sit to/from stand       Functional mobility during ADLs:  Minimal assistance;Rolling walker (2 wheels) (approx 20 feet prior to  requiring rest break) General ADL Comments: Pt with decreased activity tolerance and requiring increased time and frequent seated rest breaks during ADLs.     Vision Baseline Vision/History: 1 Wears glasses (readers) Ability to See in Adequate Light: 0 Adequate (with glasses) Patient Visual Report: No change from baseline       Perception         Praxis         Pertinent Vitals/Pain Pain Assessment Pain Assessment: Faces Faces Pain Scale: Hurts little more (4 in standing; 0-2 in sitting) Pain Location: groin at site of hematoma Pain Descriptors / Indicators: Discomfort, Grimacing, Sore Pain Intervention(s): Limited activity within patient's tolerance, Monitored during session, Repositioned     Extremity/Trunk Assessment Upper Extremity Assessment Upper Extremity Assessment: Right hand dominant;Generalized weakness;RUE deficits/detail;LUE deficits/detail RUE Deficits / Details: generalized weakness; AROM/PROM shoulder flexion to approx. 80 degrees RUE Sensation: WNL RUE Coordination: WNL LUE Deficits / Details: generalized weakness; AROM/PROM shoulder flexion to approx. 90 degrees LUE Sensation: WNL LUE Coordination: WNL   Lower Extremity Assessment Lower Extremity Assessment: Defer to PT evaluation   Cervical / Trunk Assessment Cervical / Trunk Assessment: Other exceptions Cervical / Trunk Exceptions: distended abdomen   Communication Communication Communication: No apparent difficulties   Cognition Arousal: Alert Behavior During Therapy: WFL for tasks assessed/performed Overall Cognitive Status: Within Functional Limits for tasks assessed                                 General Comments: AAOx4 and pleasant throughout session. Pt demonstrates good insight into deficits, good safety awareness, and ablity to follow multi-step directions consistantly.     General Comments  Pt reporting dizziness in standing with BP 150/79. All other VSS on RA  throughout session. RN present at end of session.     Exercises     Shoulder Instructions      Home Living Family/patient expects to be discharged to:: Private residence Living Arrangements: Spouse/significant other Available Help at Discharge: Family;Available 24 hours/day Type of Home: House Home Access: Stairs to enter [employer] of Steps: 1 Entrance Stairs-Rails: None Home Layout: Two level;Bed/bath upstairs Alternate Level Stairs-Number of Steps: flight (pt also has stair lift)   Bathroom Shower/Tub: Producer, television/film/video: Handicapped height Bathroom Accessibility: Yes How Accessible: Accessible via walker Home Equipment: Rolling Walker (2 wheels);Rollator (4 wheels);Shower seat;Grab bars - toilet;Grab bars - tub/shower;Hand held shower head;Other (comment) (stair lift; adjustable bed)   Additional Comments: Pt has three Rollators- one for community mobility, one on the main floor of her home, and one for the second floor of her home.      Prior Functioning/Environment Prior Level of Function : Independent/Modified Independent             Mobility Comments: At baseline, pt uses a Rollator for functional mobility. ADLs Comments: At baselin, pt perfroms ADLs Independent to Mod I and receives PRN assistance from husband for IADLs.        OT Problem List: Decreased strength;Decreased range of motion;Decreased activity tolerance;Impaired balance (sitting and/or standing);Decreased knowledge of use of DME or AE;Pain      OT Treatment/Interventions: Self-care/ADL training;Therapeutic exercise;Energy conservation;DME and/or AE instruction;Therapeutic activities;Patient/family education;Balance training    OT Goals(Current goals can be found in the care plan section) Acute Rehab OT Goals Patient Stated Goal: to get stronger and return home with husband  OT Goal Formulation: With patient Time For Goal Achievement: 11/12/22 Potential to Achieve  Goals: Good ADL Goals Pt Will Perform Lower Body Bathing: with contact guard assist;with adaptive equipment;sitting/lateral leans;sit to/from stand Pt Will Perform Lower Body Dressing: with contact guard assist;sitting/lateral leans;sit to/from stand (with adaptive equipment as needed) Pt Will Transfer to Toilet: with supervision;ambulating;grab bars (comfort height toilet; with least restrictive AD) Pt Will Perform Toileting - Clothing Manipulation and hygiene: with supervision;sitting/lateral leans;sit to/from stand Pt/caregiver will Perform Home Exercise Program: Increased strength;Increased ROM;Both right and left upper extremity;With theraputty;Independently;With written HEP provided (AROM progressing to theraband; Increased activity tolerance)  OT Frequency: Min 1X/week    Co-evaluation              AM-PAC OT "6 Clicks" Daily Activity     Outcome Measure Help from another person eating meals?: None Help from another person taking care of personal grooming?: A Little Help from another person toileting, which includes using toliet, bedpan, or urinal?: A Lot Help from another person bathing (including washing, rinsing, drying)?: A Lot Help from another person to put on and taking off regular upper body clothing?: A Little Help from another person to put on and taking off regular lower body clothing?: A Lot 6 Click Score: 16   End of Session Equipment Utilized During Treatment: Rolling walker (2 wheels);Gait belt Nurse Communication: Mobility status;Other (comment) (Vital signs)  Activity Tolerance: Patient tolerated treatment well;Patient limited by fatigue Patient left: in chair;with call bell/phone within reach;Other (comment) (with RN present)  OT Visit Diagnosis: Other abnormalities of gait and mobility (R26.89);Unsteadiness on feet (R26.81);Muscle weakness (generalized) (M62.81);Pain                Time: 8119-1478 OT Time Calculation (min): 31 min Charges:  OT General  Charges $OT Visit: 1 Visit OT Evaluation $OT Eval Moderate Complexity: 1 Mod OT Treatments $Self Care/Home Management : 8-22 mins  Shirleyann Montero "Orson Eva., OTR/L, MA Acute Rehab [phone]   Lendon Colonel 10/28/2022, 10:53 AM

## 2022-10-28 NOTE — Plan of Care (Signed)
  Problem: Education: Goal: Understanding of CV disease, CV risk reduction, and recovery process will improve Outcome: Not Progressing Goal: Individualized Educational Video(s) Outcome: Not Progressing   Problem: Activity: Goal: Ability to return to baseline activity level will improve Outcome: Not Progressing   Problem: Cardiovascular: Goal: Ability to achieve and maintain adequate cardiovascular perfusion will improve Outcome: Not Progressing Goal: Vascular access site(s) Level 0-1 will be maintained Outcome: Not Progressing   Problem: Health Behavior/Discharge Planning: Goal: Ability to safely manage health-related needs after discharge will improve Outcome: Not Progressing   Problem: Education: Goal: Ability to describe self-care measures that may prevent or decrease complications (Diabetes Survival Skills Education) will improve Outcome: Not Progressing Goal: Individualized Educational Video(s) Outcome: Not Progressing   Problem: Coping: Goal: Ability to adjust to condition or change in health will improve Outcome: Not Progressing   Problem: Fluid Volume: Goal: Ability to maintain a balanced intake and output will improve Outcome: Not Progressing   Problem: Health Behavior/Discharge Planning: Goal: Ability to identify and utilize available resources and services will improve Outcome: Not Progressing Goal: Ability to manage health-related needs will improve Outcome: Not Progressing   Problem: Metabolic: Goal: Ability to maintain appropriate glucose levels will improve Outcome: Not Progressing   Problem: Nutritional: Goal: Maintenance of adequate nutrition will improve Outcome: Not Progressing Goal: Progress toward achieving an optimal weight will improve Outcome: Not Progressing   Problem: Skin Integrity: Goal: Risk for impaired skin integrity will decrease Outcome: Not Progressing   Problem: Tissue Perfusion: Goal: Adequacy of tissue perfusion will  improve Outcome: Not Progressing   Problem: Education: Goal: Knowledge of General Education information will improve Description: Including pain rating scale, medication(s)/side effects and non-pharmacologic comfort measures Outcome: Not Progressing   Problem: Health Behavior/Discharge Planning: Goal: Ability to manage health-related needs will improve Outcome: Not Progressing   Problem: Clinical Measurements: Goal: Ability to maintain clinical measurements within normal limits will improve Outcome: Not Progressing Goal: Will remain free from infection Outcome: Not Progressing Goal: Diagnostic test results will improve Outcome: Not Progressing Goal: Respiratory complications will improve Outcome: Not Progressing Goal: Cardiovascular complication will be avoided Outcome: Not Progressing   Problem: Activity: Goal: Risk for activity intolerance will decrease Outcome: Not Progressing   Problem: Nutrition: Goal: Adequate nutrition will be maintained Outcome: Not Progressing   Problem: Coping: Goal: Level of anxiety will decrease Outcome: Not Progressing   Problem: Elimination: Goal: Will not experience complications related to bowel motility Outcome: Not Progressing Goal: Will not experience complications related to urinary retention Outcome: Not Progressing   Problem: Pain Managment: Goal: General experience of comfort will improve Outcome: Not Progressing   Problem: Safety: Goal: Ability to remain free from injury will improve Outcome: Not Progressing   Problem: Skin Integrity: Goal: Risk for impaired skin integrity will decrease Outcome: Not Progressing

## 2022-10-28 NOTE — Evaluation (Signed)
Physical Therapy Evaluation Patient Details Name: [name] [name] [name]: [id number] DOB: 08-Mar-1945 Today's Date: 10/28/2022  History of Present Illness  Pt is a 77 y.o. female admitted to [facility] 9/20 with unstable angina and underwent diagnostic catheterization with PCI to proximal RCA. Initial plan was for PTCA in staged fashion but this is now deferred due to a groin hematoma and AKI. PMH: CAD s/p CABG, diastolic CHF, and cerebrovascular disease with history of CVA  Clinical Impression  Patient presents with decreased mobility due to generalized weakness, decreased activity tolerance, and decreased balance.  Normally able to ambulate independently with rollator in the home and complete most BADL's.  Today needing min A for mobility in the room limited by fatigue and feeling LE weakness.  She will benefit from skilled PT in the acute setting and from follow up HHPT at d/c.      If plan is discharge home, recommend the following: A little help with walking and/or transfers;A little help with bathing/dressing/bathroom;Assistance with cooking/housework;Assist for transportation;Help with stairs or ramp for entrance   Can travel by private vehicle        Equipment Recommendations None recommended by PT  Recommendations for Other Services       Functional Status Assessment Patient has had a recent decline in their functional status and demonstrates the ability to make significant improvements in function in a reasonable and predictable amount of time.     Precautions / Restrictions Precautions Precautions: Fall Restrictions Weight Bearing Restrictions: No      Mobility  Bed Mobility               General bed mobility comments: up in chair    Transfers Overall transfer level: Needs assistance Equipment used: Rollator (4 wheels) Transfers: Sit to/from Stand Sit to Stand: Contact guard assist           General transfer comment: cues for hand placement, assist for balance     Ambulation/Gait Ambulation/Gait assistance: Min assist Gait Distance (Feet): 12 Feet (x 2) Assistive device: Rollator (4 wheels) Gait Pattern/deviations: Step-to pattern, Step-through pattern, Decreased stride length, Wide base of support, Trunk flexed       General Gait Details: slow pace and fatigued quickly sat on EOB after walking around bed, then walked back around to chair  Stairs            Wheelchair Mobility     Tilt Bed    Modified Rankin (Stroke Patients Only)       Balance Overall balance assessment: Needs assistance   Sitting balance-Leahy Scale: Fair     Standing balance support: Bilateral upper extremity supported, Reliant on assistive device for balance Standing balance-Leahy Scale: Poor                               Pertinent Vitals/Pain Pain Assessment Faces Pain Scale: Hurts a little bit Pain Location: groin Pain Descriptors / Indicators: Discomfort Pain Intervention(s): Monitored during session    Home Living Family/patient expects to be discharged to:: Private residence Living Arrangements: Spouse/significant other Available Help at Discharge: Family;Available 24 hours/day Type of Home: House Home Access: Stairs to enter Entrance Stairs-Rails: None Entrance Stairs-Number of Steps: 1 Alternate Level Stairs-Number of Steps: flight with stair lift Home Layout: Two level;Bed/bath upstairs Home Equipment: Agricultural consultant (2 wheels);Rollator (4 wheels);Shower seat;Grab bars - toilet;Grab bars - tub/shower;Hand held shower head;Other (comment) Additional Comments: Pt has three Rollators- one for community mobility,  one on the main floor of her home, and one for the second floor of her home.    Prior Function Prior Level of Function : Independent/Modified Independent             Mobility Comments: At baseline, pt uses a Rollator for functional mobility. ADLs Comments: At baseline, pt perfroms ADLs Independent to Mod I and  receives PRN assistance from husband for IADLs.     Extremity/Trunk Assessment   Upper Extremity Assessment Upper Extremity Assessment: Defer to OT evaluation RUE Deficits / Details: generalized weakness; AROM/PROM shoulder flexion to approx. 80 degrees RUE Sensation: WNL RUE Coordination: WNL LUE Deficits / Details: generalized weakness; AROM/PROM shoulder flexion to approx. 90 degrees LUE Sensation: WNL LUE Coordination: WNL    Lower Extremity Assessment Lower Extremity Assessment: Generalized weakness    Cervical / Trunk Assessment Cervical / Trunk Assessment: Other exceptions Cervical / Trunk Exceptions: distended abdomen  Communication   Communication Communication: No apparent difficulties  Cognition Arousal: Alert Behavior During Therapy: WFL for tasks assessed/performed Overall Cognitive Status: Within Functional Limits for tasks assessed                                          General Comments General comments (skin integrity, edema, etc.): VSS with mobility in room on RA    Exercises     Assessment/Plan    PT Assessment Patient needs continued PT services  PT Problem List Cardiopulmonary status limiting activity;Decreased mobility;Decreased activity tolerance;Decreased balance;Decreased strength       PT Treatment Interventions DME instruction;Gait training;Therapeutic activities;Functional mobility training;Balance training;Therapeutic exercise;Patient/family education    PT Goals (Current goals can be found in the Care Plan section)  Acute Rehab PT Goals Patient Stated Goal: to get stronger PT Goal Formulation: With patient Time For Goal Achievement: 11/11/22 Potential to Achieve Goals: Good    Frequency Min 1X/week     Co-evaluation               AM-PAC PT "6 Clicks" Mobility  Outcome Measure Help needed turning from your back to your side while in a flat bed without using bedrails?: A Little Help needed moving from  lying on your back to sitting on the side of a flat bed without using bedrails?: A Little Help needed moving to and from a bed to a chair (including a wheelchair)?: A Little Help needed standing up from a chair using your arms (e.g., wheelchair or bedside chair)?: A Little Help needed to walk in hospital room?: A Little Help needed climbing 3-5 steps with a railing? : Total 6 Click Score: 16    End of Session Equipment Utilized During Treatment: Gait belt Activity Tolerance: Patient limited by fatigue Patient left: in chair;with call bell/phone within reach   PT Visit Diagnosis: Other abnormalities of gait and mobility (R26.89);Muscle weakness (generalized) (M62.81)    Time: 9147-8295 PT Time Calculation (min) (ACUTE ONLY): 27 min   Charges:   PT Evaluation $PT Eval Moderate Complexity: 1 Mod PT Treatments $Gait Training: 8-22 mins PT General Charges $$ ACUTE PT VISIT: 1 Visit         Sheran Lawless, PT Acute Rehabilitation Services [phone] 10/28/2022   [name] [name] 10/28/2022, 1:04 PM

## 2022-10-28 NOTE — Progress Notes (Signed)
Mobility Specialist Progress Note:   10/28/22 0900  Mobility  Activity Ambulated with assistance in room;Transferred from bed to chair  Level of Assistance Contact guard assist, steadying assist  Assistive Device Front wheel walker  Distance Ambulated (ft) 25 ft  Activity Response Tolerated well  Mobility Referral Yes  $Mobility charge 1 Mobility  Mobility Specialist Start Time (ACUTE ONLY) 0854  Mobility Specialist Stop Time (ACUTE ONLY) 0919  Mobility Specialist Time Calculation (min) (ACUTE ONLY) 25 min    Pre Mobility: 54 HR,  94% SpO2 Post Mobility:  59 HR, 94% SpO2  Pt received in bed, agreeable to mobility. MinA for bed mobility, minG for STS. Ambulated around bed to chair. Asymptomatic throughout. Pt left in chair with call bell and all needs met.  [place]ante Earlene Plater Mobility Specialist Please contact via Special educational needs teacher or Rehab office at [phone]

## 2022-10-29 DIAGNOSIS — I200 Unstable angina: Secondary | ICD-10-CM | POA: Diagnosis not present

## 2022-10-29 LAB — CBC
HCT: 35.7 % — ABNORMAL LOW (ref 36.0–46.0)
Hemoglobin: 11.7 g/dL — ABNORMAL LOW (ref 12.0–15.0)
MCH: 31.5 pg (ref 26.0–34.0)
MCHC: 32.8 g/dL (ref 30.0–36.0)
MCV: 96.2 fL (ref 80.0–100.0)
Platelets: 126 10*3/uL — ABNORMAL LOW (ref 150–400)
RBC: 3.71 MIL/uL — ABNORMAL LOW (ref 3.87–5.11)
RDW: 13.1 % (ref 11.5–15.5)
WBC: 8.4 10*3/uL (ref 4.0–10.5)
nRBC: 0 % (ref 0.0–0.2)

## 2022-10-29 LAB — GLUCOSE, CAPILLARY
Glucose-Capillary: 139 mg/dL — ABNORMAL HIGH (ref 70–99)
Glucose-Capillary: 152 mg/dL — ABNORMAL HIGH (ref 70–99)
Glucose-Capillary: 139 mg/dL — ABNORMAL HIGH (ref 70–99)
Glucose-Capillary: 141 mg/dL — ABNORMAL HIGH (ref 70–99)

## 2022-10-29 LAB — BASIC METABOLIC PANEL
Anion gap: 7 (ref 5–15)
BUN: 18 mg/dL (ref 8–23)
CO2: 28 mmol/L (ref 22–32)
Calcium: 8.7 mg/dL — ABNORMAL LOW (ref 8.9–10.3)
Chloride: 102 mmol/L (ref 98–111)
Creatinine, Ser: 1.02 mg/dL — ABNORMAL HIGH (ref 0.44–1.00)
GFR, Estimated: 58 mL/min — ABNORMAL LOW (ref >60)
Glucose, Bld: 146 mg/dL — ABNORMAL HIGH (ref 70–99)
Potassium: 4.4 mmol/L (ref 3.5–5.1)
Sodium: 137 mmol/L (ref 135–145)

## 2022-10-29 MED ORDER — SPIRONOLACTONE 25 MG PO TABS
25.0000 mg | ORAL_TABLET | Freq: Every day | ORAL | Status: DC
  Administered 2022-10-29 – 2022-10-30 (×2): 25 mg via ORAL
  Filled 2022-10-29 (×2): qty 1

## 2022-10-29 NOTE — Progress Notes (Signed)
Physical Therapy Treatment Patient Details Name: [name] [name] [name]: [id number] DOB: 02-12-1945 Today's Date: 10/29/2022   History of Present Illness Pt is a 77 y.o. female admitted to [facility] 9/20 with unstable angina and underwent diagnostic catheterization with PCI to proximal RCA. Initial plan was for PTCA in staged fashion but this is now deferred due to a groin hematoma and AKI. PMH: CAD s/p CABG, diastolic CHF, and cerebrovascular disease with history of CVA    PT Comments  Patient progressing and able to ambulate into hallway and back into room with one seated rest.  She was able to tolerate on RA with VSS.  She is eager for home though a little worried about her energy level.  PT will continue to follow and recommend HHPT at d/c.     If plan is discharge home, recommend the following: A little help with walking and/or transfers;A little help with bathing/dressing/bathroom;Assistance with cooking/housework;Assist for transportation;Help with stairs or ramp for entrance   Can travel by private vehicle        Equipment Recommendations  None recommended by PT    Recommendations for Other Services       Precautions / Restrictions Precautions Precautions: Fall     Mobility  Bed Mobility Overal bed mobility: Needs Assistance Bed Mobility: Supine to Sit, Sit to Supine     Supine to sit: Used rails, Min assist Sit to supine: Min assist   General bed mobility comments: hand on her back for balance as she transioned legs off bed and scooting to EOB with increased time; to supine assist for moving legs up over EOB and repositioning in supine, bed in trendelenberg for pt to scoot up in bed.    Transfers Overall transfer level: Needs assistance Equipment used: Rollator (4 wheels) Transfers: Sit to/from Stand Sit to Stand: Min assist           General transfer comment: rocking for momentum and assist for balance up from EOB then from rollator in hallway     Ambulation/Gait Ambulation/Gait assistance: Min assist Gait Distance (Feet): 20 Feet (x 2) Assistive device: Rollator (4 wheels) Gait Pattern/deviations: Step-to pattern, Step-through pattern, Decreased stride length, Wide base of support, Trunk flexed, Trendelenburg       General Gait Details: some lateral hip instability noted with ambulation and encouraged to target distance then to sit on rollator which she did prior to walking back to room; on RA throughout   Stairs             Wheelchair Mobility     Tilt Bed    Modified Rankin (Stroke Patients Only)       Balance Overall balance assessment: Needs assistance   Sitting balance-Leahy Scale: Good     Standing balance support: Bilateral upper extremity supported, Reliant on assistive device for balance Standing balance-Leahy Scale: Poor                              Cognition Arousal: Alert Behavior During Therapy: WFL for tasks assessed/performed Overall Cognitive Status: Within Functional Limits for tasks assessed                                          Exercises      General Comments General comments (skin integrity, edema, etc.): VSS on RA throughout; spouse present and supportive  Pertinent Vitals/Pain Pain Assessment Pain Assessment: Faces Faces Pain Scale: Hurts a little bit Pain Location: R leg Pain Descriptors / Indicators: Discomfort Pain Intervention(s): Monitored during session, Repositioned    Home Living                          Prior Function            PT Goals (current goals can now be found in the care plan section) Progress towards PT goals: Progressing toward goals    Frequency    Min 1X/week      PT Plan      Co-evaluation              AM-PAC PT "6 Clicks" Mobility   Outcome Measure  Help needed turning from your back to your side while in a flat bed without using bedrails?: A Little Help needed moving  from lying on your back to sitting on the side of a flat bed without using bedrails?: A Little Help needed moving to and from a bed to a chair (including a wheelchair)?: A Little Help needed standing up from a chair using your arms (e.g., wheelchair or bedside chair)?: A Little Help needed to walk in hospital room?: A Little Help needed climbing 3-5 steps with a railing? : Total 6 Click Score: 16    End of Session Equipment Utilized During Treatment: Gait belt Activity Tolerance: Patient limited by fatigue Patient left: in bed;with call bell/phone within reach   PT Visit Diagnosis: Other abnormalities of gait and mobility (R26.89);Muscle weakness (generalized) (M62.81)     Time: 4098-1191 PT Time Calculation (min) (ACUTE ONLY): 23 min  Charges:    $Gait Training: 8-22 mins $Therapeutic Activity: 8-22 mins PT General Charges $$ ACUTE PT VISIT: 1 Visit                     Sheran Lawless, PT Acute Rehabilitation Services Office:[phone] 10/29/2022    Elray Mcgregor 10/29/2022, 5:12 PM

## 2022-10-29 NOTE — Progress Notes (Addendum)
Rounding Note    Patient Name: [name] [name] Date of Encounter: 10/29/2022  [facility] Cardiologist: Lance Muss, MD   Subjective   Remains very weak, but has been up to the bathroom several times today.  Inpatient Medications    Scheduled Meds:  ALPRAZolam  0.5 mg Oral Q lunch   And   ALPRAZolam  1 mg Oral QHS   apixaban  5 mg Oral BID   buPROPion  300 mg Oral Daily   Chlorhexidine Gluconate Cloth  6 each Topical Daily   clopidogrel  75 mg Oral q morning   cyanocobalamin  1,000 mcg Oral q morning   DULoxetine  30 mg Oral Daily   ezetimibe  10 mg Oral QHS   gabapentin  600 mg Oral q morning   And   gabapentin  1,200 mg Oral QHS   insulin aspart  0-15 Units Subcutaneous TID WC   insulin aspart  0-5 Units Subcutaneous QHS   mometasone-formoterol  2 puff Inhalation BID   ondansetron  4 mg Oral BID   pantoprazole  40 mg Oral Daily   rosuvastatin  40 mg Oral Daily   sodium chloride flush  3 mL Intravenous Q12H   traZODone  100 mg Oral QHS   Continuous Infusions:  sodium chloride 10 mL/hr at 10/26/22 1700   sodium chloride     PRN Meds: sodium chloride, acetaminophen, albuterol, bisacodyl, loperamide, nitroGLYCERIN, ondansetron (ZOFRAN) IV, oxyCODONE, sodium chloride flush   Vital Signs    Vitals:   10/29/22 0500 10/29/22 0729 10/29/22 0743 10/29/22 1228  BP: (!) 144/45  (!) 139/43 (!) 132/42  Pulse: (!) 56 (!) 52 (!) 58 (!) 58  Resp: 18 20 18 16   Temp: 98.3 F (36.8 C)  98.8 F (37.1 C) 97.7 F (36.5 C)  TempSrc: Oral  Oral Oral  SpO2: 96% 95% 90% 93%  Weight: 100.6 kg     Height:        Intake/Output Summary (Last 24 hours) at 10/29/2022 1355 Last data filed at 10/28/2022 1948 Gross per 24 hour  Intake 240 ml  Output 500 ml  Net -260 ml      10/29/2022    5:00 AM 10/28/2022    3:34 AM 10/27/2022    3:25 AM  Last 3 Weights  Weight (lbs) 221 lb 12.5 oz 223 lb 1.7 oz 222 lb 3.6 oz  Weight (kg) 100.6 kg 101.2 kg 100.8 kg       Telemetry    Sinus Bradycardia - Personally Reviewed  Physical Exam   GEN: No acute distress.  Obese older female Neck: No JVD Cardiac: RRR, no murmurs, rubs, or gallops.  Respiratory: Clear to auscultation bilaterally. GI: Soft, nontender, non-distended  MS: No edema; No deformity. Right groin with bruising, small blister noted on upper skin fold (opened and draining) Neuro:  Nonfocal  Psych: Normal affect   Labs    High Sensitivity Troponin:   Recent Labs  Lab 10/22/22 1717 10/22/22 1749  TROPONINIHS 15 15     Chemistry Recent Labs  Lab 10/22/22 1717 10/22/22 1749 10/23/22 0441 10/25/22 0258 10/26/22 0348 10/27/22 0227 10/28/22 0222 10/29/22 0543  NA 141 141   < > 136   < > 136 136 137  K 3.5 3.4*   < > 4.1   < > 4.5 4.6 4.4  CL 107 105   < > 102   < > 102 102 102  CO2 25 26   < > 26   < >  26 27 28   GLUCOSE 112* 118*   < > 101*   < > 118* 124* 146*  BUN 13 13   < > 42*   < > 27* 21 18  CREATININE 0.99 0.83   < > 2.51*   < > 1.24* 1.22* 1.02*  CALCIUM 8.7* 9.0   < > 8.1*   < > 8.5* 8.8* 8.7*  MG  --  2.1  --  2.2  --   --   --   --   PROT 5.9* 6.0*  --   --   --   --   --   --   ALBUMIN 3.5 3.6  --   --   --   --   --   --   AST 18 19  --   --   --   --   --   --   ALT 17 20  --   --   --   --   --   --   ALKPHOS 47 50  --   --   --   --   --   --   BILITOT 1.1 1.1  --   --   --   --   --   --   GFRNONAA 60* >60   < > 20*   < > 46* 47* 58*  ANIONGAP 9 10   < > 8   < > 8 7 7    < > = values in this interval not displayed.    Lipids No results for input(s): "CHOL", "TRIG", "HDL", "LABVLDL", "LDLCALC", "CHOLHDL" in the last 168 hours.  Hematology Recent Labs  Lab 10/27/22 0227 10/28/22 0222 10/29/22 0543  WBC 7.2 8.6 8.4  RBC 3.58* 3.61* 3.71*  HGB 11.1* 11.2* 11.7*  HCT 33.3* 34.1* 35.7*  MCV 93.0 94.5 96.2  MCH 31.0 31.0 31.5  MCHC 33.3 32.8 32.8  RDW 13.0 13.2 13.1  PLT 123* 127* 126*   Thyroid  Recent Labs  Lab 10/22/22 1759  TSH 0.750     BNP Recent Labs  Lab 10/22/22 1717 10/22/22 1749  BNP 126.2* 133.9*    DDimer No results for input(s): "DDIMER" in the last 168 hours.   Radiology    No results found.  Cardiac Studies   Cath: 10/22/2022    Ost LAD to Prox LAD lesion is 90% stenosed.   Prox LAD lesion is 100% stenosed.   Ost Cx to Prox Cx lesion is 70% stenosed.   Mid RCA to Dist RCA lesion is 40% stenosed.   Prox RCA to Mid RCA lesion is 70% stenosed.   Origin lesion is 100% stenosed.   1st Mrg lesion is 100% stenosed.   3rd Mrg lesion is 100% stenosed.   Ost RCA to Prox RCA lesion is 50% stenosed.   Prox Graft lesion is 30% stenosed.   Origin to Prox Graft lesion is 30% stenosed.   Prox RCA lesion is 99% stenosed.   1. Mildly elevated PCWP and LVEDP 2. Mixed pulmonary venous/pulmonary arterial hypertension, moderate.  PVR 3 WU.  3. Preserved cardiac output.  4. Known occluded SVG-PDA.  Native RCA has stents extending from the ostium to the distal vessel.  99% in-stent restenosis in the proximal RCA, at least 2 layers of stent are present.    Discussed with Dr. Herbie Baltimore, will plan on cutting balloon angioplasty to this lesion on Monday.  However, long-term, this vessel will remain very tenuous.  Will  admit over weekend due to unstable angina.    Diagnostic Dominance: Right   Patient Profile     77 y.o. female  has a history of CAD s/p CABG, diastolic CHF, and cerebrovascular disease with history of CVA. She had CABG x 5 in 12/11.  Prior to the CABG she had multiple PCIs.  She had a CVA in 2/14 that presented as visual loss.  She had a left carotid stent in 2/15. Presented for outpatient cardiac cath.   Assessment & Plan    CAD -- underwent outpatient cardiac cath and found to have severe ISR of the RCA with plans for PTCA in staged fashion. This has been deferred in the setting of right groin hematoma with hypotension and AKI. No plans for PCI this admission. Likely return for a radial approach in  several weeks  -- continue medical therapy, on plavix (no ASA with need for [facility])   Chronic HFrEF -- LVEF of 30-35%, has been on GDMT PTA but now held in the setting of hypotension and AKI -- no BB with her continued bradycardia -- will resume spiro 25mg  daily today. Suspect she may need an ARB in the place of Entresto at discharge    R groin hematoma -- developed hypotension and bradycardia, H/H remains stable -- has been transitioned from IV heparin to Eliquis and tolerating well   AKI -- improving, Cr now WNL -- follow BMET -- as noted above, no plans for PCI in the acute setting    Paroxysmal Atrial fibrillation -- remains in sinus rhythm -- resumed on Eliquis 5mg  BID  Dispo: she remains very weak and limited in her mobility. Only able to get up to the bathroom with staff assistance. PT/OT following. Plans at this time, HHPT.   For questions or updates, please contact [facility] Please consult [email or web address] for contact info under        Signed, Laverda Page, NP  10/29/2022, 1:55 PM      ATTENDING ATTESTATION:  After conducting a review of all available clinical information with the care team, interviewing the patient, and performing a physical exam, I agree with the findings and plan described in this note.   GEN: No acute distress.   HEENT:  MMM, no JVD, no scleral icterus Cardiac: RRR, no murmurs, rubs, or gallops.  Respiratory: Clear to auscultation bilaterally. GI: Soft, nontender, non-distended  MS: No edema; No deformity. Neuro:  Nonfocal  Vasc:  +2 radial pulses; moderate R groin bruising + serosanguineous drainage  The patient is doing well from a cardiovascular perspective.  Unfortunately she is quite deconditioned.  She did walk about 5 times a day and was quite exhausted from this.  I reiterated the need for her to continue to ambulate.  After discussion with the patient we will plan on discharge tomorrow.  Continue current medical therapy.   Plan on outpatient PCI from the radial approach for ISR with balloon angioplasty only.   Alverda Skeans, MD Pager [phone]

## 2022-10-30 DIAGNOSIS — Z95828 Presence of other vascular implants and grafts: Secondary | ICD-10-CM | POA: Diagnosis not present

## 2022-10-30 DIAGNOSIS — I340 Nonrheumatic mitral (valve) insufficiency: Secondary | ICD-10-CM

## 2022-10-30 DIAGNOSIS — I25810 Atherosclerosis of coronary artery bypass graft(s) without angina pectoris: Secondary | ICD-10-CM

## 2022-10-30 DIAGNOSIS — I255 Ischemic cardiomyopathy: Secondary | ICD-10-CM

## 2022-10-30 DIAGNOSIS — Z8673 Personal history of transient ischemic attack (TIA), and cerebral infarction without residual deficits: Secondary | ICD-10-CM

## 2022-10-30 DIAGNOSIS — I200 Unstable angina: Secondary | ICD-10-CM | POA: Diagnosis not present

## 2022-10-30 DIAGNOSIS — I480 Paroxysmal atrial fibrillation: Secondary | ICD-10-CM

## 2022-10-30 LAB — CBC
HCT: 32.6 % — ABNORMAL LOW (ref 36.0–46.0)
Hemoglobin: 10.8 g/dL — ABNORMAL LOW (ref 12.0–15.0)
MCH: 30.9 pg (ref 26.0–34.0)
MCHC: 33.1 g/dL (ref 30.0–36.0)
MCV: 93.4 fL (ref 80.0–100.0)
Platelets: 133 10*3/uL — ABNORMAL LOW (ref 150–400)
RBC: 3.49 MIL/uL — ABNORMAL LOW (ref 3.87–5.11)
RDW: 13.2 % (ref 11.5–15.5)
WBC: 8.1 10*3/uL (ref 4.0–10.5)
nRBC: 0 % (ref 0.0–0.2)

## 2022-10-30 LAB — BASIC METABOLIC PANEL
Anion gap: 9 (ref 5–15)
BUN: 19 mg/dL (ref 8–23)
CO2: 28 mmol/L (ref 22–32)
Calcium: 8.7 mg/dL — ABNORMAL LOW (ref 8.9–10.3)
Chloride: 98 mmol/L (ref 98–111)
Creatinine, Ser: 1.16 mg/dL — ABNORMAL HIGH (ref 0.44–1.00)
GFR, Estimated: 49 mL/min — ABNORMAL LOW (ref >60)
Glucose, Bld: 120 mg/dL — ABNORMAL HIGH (ref 70–99)
Potassium: 4.2 mmol/L (ref 3.5–5.1)
Sodium: 135 mmol/L (ref 135–145)

## 2022-10-30 LAB — LIPID PANEL
Cholesterol: 81 mg/dL (ref 0–200)
HDL: 49 mg/dL (ref >40)
LDL Cholesterol: 25 mg/dL (ref 0–99)
Total CHOL/HDL Ratio: 1.7 {ratio}
Triglycerides: 36 mg/dL (ref <150)
VLDL: 7 mg/dL (ref 0–40)

## 2022-10-30 LAB — GLUCOSE, CAPILLARY
Glucose-Capillary: 142 mg/dL — ABNORMAL HIGH (ref 70–99)
Glucose-Capillary: 179 mg/dL — ABNORMAL HIGH (ref 70–99)

## 2022-10-30 NOTE — Plan of Care (Signed)
Problem: Education: Goal: Understanding of CV disease, CV risk reduction, and recovery process will improve Outcome: Adequate for Discharge Goal: Individualized Educational Video(s) Outcome: Adequate for Discharge   Problem: Activity: Goal: Ability to return to baseline activity level will improve Outcome: Adequate for Discharge   Problem: Cardiovascular: Goal: Ability to achieve and maintain adequate cardiovascular perfusion will improve Outcome: Adequate for Discharge Goal: Vascular access site(s) Level 0-1 will be maintained Outcome: Adequate for Discharge   Problem: Health Behavior/Discharge Planning: Goal: Ability to safely manage health-related needs after discharge will improve Outcome: Adequate for Discharge   Problem: Education: Goal: Ability to describe self-care measures that may prevent or decrease complications (Diabetes Survival Skills Education) will improve Outcome: Adequate for Discharge Goal: Individualized Educational Video(s) Outcome: Adequate for Discharge   Problem: Coping: Goal: Ability to adjust to condition or change in health will improve Outcome: Adequate for Discharge   Problem: Fluid Volume: Goal: Ability to maintain a balanced intake and output will improve Outcome: Adequate for Discharge   Problem: Health Behavior/Discharge Planning: Goal: Ability to identify and utilize available resources and services will improve Outcome: Adequate for Discharge Goal: Ability to manage health-related needs will improve Outcome: Adequate for Discharge   Problem: Metabolic: Goal: Ability to maintain appropriate glucose levels will improve Outcome: Adequate for Discharge   Problem: Nutritional: Goal: Maintenance of adequate nutrition will improve Outcome: Adequate for Discharge Goal: Progress toward achieving an optimal weight will improve Outcome: Adequate for Discharge   Problem: Skin Integrity: Goal: Risk for impaired skin integrity will  decrease Outcome: Adequate for Discharge   Problem: Tissue Perfusion: Goal: Adequacy of tissue perfusion will improve Outcome: Adequate for Discharge   Problem: Education: Goal: Knowledge of General Education information will improve Description: Including pain rating scale, medication(s)/side effects and non-pharmacologic comfort measures Outcome: Adequate for Discharge   Problem: Health Behavior/Discharge Planning: Goal: Ability to manage health-related needs will improve Outcome: Adequate for Discharge   Problem: Clinical Measurements: Goal: Ability to maintain clinical measurements within normal limits will improve Outcome: Adequate for Discharge Goal: Will remain free from infection Outcome: Adequate for Discharge Goal: Diagnostic test results will improve Outcome: Adequate for Discharge Goal: Respiratory complications will improve Outcome: Adequate for Discharge Goal: Cardiovascular complication will be avoided Outcome: Adequate for Discharge   Problem: Activity: Goal: Risk for activity intolerance will decrease Outcome: Adequate for Discharge   Problem: Nutrition: Goal: Adequate nutrition will be maintained Outcome: Adequate for Discharge   Problem: Coping: Goal: Level of anxiety will decrease Outcome: Adequate for Discharge   Problem: Elimination: Goal: Will not experience complications related to bowel motility Outcome: Adequate for Discharge Goal: Will not experience complications related to urinary retention Outcome: Adequate for Discharge   Problem: Pain Managment: Goal: General experience of comfort will improve Outcome: Adequate for Discharge   Problem: Safety: Goal: Ability to remain free from injury will improve Outcome: Adequate for Discharge   Problem: Skin Integrity: Goal: Risk for impaired skin integrity will decrease Outcome: Adequate for Discharge

## 2022-10-30 NOTE — Progress Notes (Addendum)
Mobility Specialist Progress Note:   10/30/22 1148  Mobility  Activity Ambulated with assistance in hallway  Level of Assistance Minimal assist, patient does 75% or more  Assistive Device Four wheel walker  Distance Ambulated (ft) 110 ft (60+10+40)  Activity Response Tolerated well  Mobility Referral Yes  $Mobility charge 1 Mobility  Mobility Specialist Start Time (ACUTE ONLY) 1115  Mobility Specialist Stop Time (ACUTE ONLY) 1140  Mobility Specialist Time Calculation (min) (ACUTE ONLY) 25 min   Pre Mobility: 51 HR , 90% SpO2 RA During Mobility: 63-81 HR ,  75%-98% SpO2 RA - 2 L Post Mobility: 62 HR , 92% SpO2 2 L  Pt received in bed, agreeable to mobility. MinA to stand. CG during ambulation. 2x seated rest break required d/t general weakness and fatigue. When returning to room pt c/o SOB towards EOS with SpO2 levels as low as 75% on RA with elevated HR. Pursed lip breathing encouraged. Pt needing 2 L to increase SpO2 above 90%. Pt returned to bed asymptomatic with call bell in reach and all needs met. RN notified.  Leory Plowman  Mobility Specialist Please contact via [employer] office at [phone]

## 2022-10-30 NOTE — Progress Notes (Signed)
Nurse requested Mobility Specialist to perform oxygen saturation test with pt which includes removing pt from oxygen both at rest and while ambulating.  Below are the results from that testing.     Patient Saturations on Room Air at Rest = spO2 90%  Patient Saturations on Room Air while Ambulating = sp02 75% .  Rested and performed pursed lip breathing for 1 minute with sp02 at 84%.  Patient Saturations on 2 Liters of oxygen while Ambulating = sp02 98%  At end of testing pt left in room on 2 Liters of oxygen.  Reported results to nurse.

## 2022-10-30 NOTE — TOC Progression Note (Addendum)
Transition of Care [facility]) - Progression Note    Patient Details  Name: [name] [name] [name]: [id number] Date of Birth: 19-Nov-1945  Transition of Care [facility]) CM/SW Contact  Ronny Bacon, RN Phone Number: 10/30/2022, 12:50 PM  Clinical Narrative:  Patient in need of home oxygen. Ambulation Sat test completed and patient qualifies for home O2. Home O2 arranged through [facility]. Will need home O2 orders.   1417: [facility] aware of patient being discharged.    Expected Discharge Plan: Home w Home Health Services Barriers to Discharge: Continued Medical Work up  Expected Discharge Plan and Services   Discharge Planning Services: CM Consult Post Acute Care Choice: Home Health Living arrangements for the past 2 months: Single Family Home                 DME Arranged: Oxygen DME Agency: [employer] Date DME Agency Contacted: 10/30/22 Time DME Agency Contacted: 1245 Representative spoke with at DME Agency: Wendi Maya Agency:  [facility]) Date [facility] Agency Contacted: 10/27/22 Time HH Agency Contacted: [phone] Representative spoke with at [facility] Agency: Eber Jones   Social Determinants of Health (SDOH) Interventions SDOH Screenings   Food Insecurity: No Food Insecurity (10/27/2022)  Housing: Low Risk  (10/27/2022)  Transportation Needs: No Transportation Needs (10/27/2022)  Utilities: Not At Risk (10/27/2022)  Depression (PHQ2-9): High Risk (12/30/2021)  Tobacco Use: Low Risk  (10/27/2022)    Readmission Risk Interventions     No data to display

## 2022-10-30 NOTE — Discharge Summary (Addendum)
Discharge Summary    Patient ID: HELIN AMMONS [name]: [id number]; DOB: [birth year]  Admit date: 10/22/2022 Discharge date: 10/30/2022  PCP:  Mosetta Putt, MD   [facility] Providers Cardiologist:  Lance Muss, MD  Advanced Heart Failure:  Marca Ancona, MD  {    Discharge Diagnoses    Principal Problem:   Unstable angina [facility]) Active Problems:   CAD (coronary artery disease)    Diagnostic Studies/Procedures    R/L heart cath 10/22/22:  Fluoro time: 13.6 (min) DAP: 60586 (mGycm2) Cumulative Air Kerma: 736 (mGy)  Diagnostic Dominance: Right     Ost LAD to Prox LAD lesion is 90% stenosed.   Prox LAD lesion is 100% stenosed.   Ost Cx to Prox Cx lesion is 70% stenosed.   Mid RCA to Dist RCA lesion is 40% stenosed.   Prox RCA to Mid RCA lesion is 70% stenosed.   Origin lesion is 100% stenosed.   1st Mrg lesion is 100% stenosed.   3rd Mrg lesion is 100% stenosed.   Ost RCA to Prox RCA lesion is 50% stenosed.   Prox Graft lesion is 30% stenosed.   Origin to Prox Graft lesion is 30% stenosed.   Prox RCA lesion is 99% stenosed.   1. Mildly elevated PCWP and LVEDP 2. Mixed pulmonary venous/pulmonary arterial hypertension, moderate.  PVR 3 WU.  3. Preserved cardiac output.  4. Known occluded SVG-PDA.  Native RCA has stents extending from the ostium to the distal vessel.  99% in-stent restenosis in the proximal RCA, at least 2 layers of stent are present.    Discussed with Dr. Herbie Baltimore, will plan on cutting balloon angioplasty to this lesion on Monday.  However, long-term, this vessel will remain very tenuous.  Will admit over weekend due to unstable angina.     Right Heart Pressures RHC Procedural Findings: Hemodynamics (mmHg)  RA mean 5 RV 63/7 PA 59/15, mean 36 PCWP mean 22 LV 168/22 AO 172/55  Oxygen saturations: PA 66% AO 94%  Cardiac Output (Fick) 4.71  Cardiac Index (Fick) 2.34 PVR 3 WU   _____________   History of Present Illness      Per admission H&P on 10/22/22 by Dr Shirlee Latch:   [name] [name] is a 77 y.o. female who has a history of hypertension, hyperlipidemia, CAD s/p CABG, chronic systolic and diastolic heart failure,  CVA, insulin-dependent diabetes, morbid obesity, OSA, OHS, paroxysmal atrial fibrillation.  She had CABG x 5 in 12/11.  Prior to the CABG she had multiple PCIs.  She had a CVA in 2/14 that presented as visual loss.  She had a left carotid stent in 2/15.    Left heart cath done in Sept. 2014. This showed patent SVG-D, LIMA-LAD, and sequential SVG-OM branches but SVG-RCA and the native RCA were both totally occluded. It was felt that her increased symptoms coincided with occlusion of SVG-RCA.  She was started on Imdur to see if this would help with the chest pain and dyspnea.  However, she feels like Imdur causes leg cramps and does not think that she can take it.  So ranolazine 500 mg bid started and titrated this up to 1000 mg bid.  This helped some but not markedly. Therefore, sent to Dr. Eldridge Dace to address opening RCA CTO. He was able to do this in 5/15 with 4 overlapping Xience DES.  This led to resolution of exertional chest pain.     Mrs Sanker was started on Brilinta after PCI.  She  became much more short of breath after starting on Brilinta. Brilinta stopped and replaced with Plavix.  Dyspnea improved off Brilinta. Given increasing exertional dyspnea and chest pain,  RHC/LHC was done in 4/17.  This showed stable CAD with no interventional target.  Left and right heart filling pressures were not significantly increased.  Medical management.    She had an MRI/MRA in May 2018 that showed moderately severe stenosis of the supraclinoid segment of the right internal carotid artery, progressed since the 2016 MR angiogram, and severe basilar artery stenosis.    She developed recurrent exertional chest pain and had LHC in 6/18, showing 4/5 grafts patent with patent native RCA (similar to prior cath, no changes).       She reported increased dyspnea and chest pain and had RHC/LHC in 9/19.  She had DES to sequential SVG-OM1/PLOM. While hospitalized, she was noted to have runs of atrial fibrillation.  She was very symptomatic with the atrial fibrillation.  Eliquis and amiodarone were started.    She was admitted 12/16-12/19/19 for cardiomems implant and RHC.    She had a prolonged hospitalization in 2/20 with acute respiratory failure and fever to 103.  She was thought to have PNA and treated with cefepime.  She was intubated.  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  Amiodarone was stopped and she was put on prednisone. Echo in 2/20 showed EF down to 25-30% with mid-apical LV severe hypokinesis. She had AKI. Possible ITP triggered by infection, HIT negative.  ITP was treated with Solumedrol. She was discharged to SNF.     11/20 admission initially with concern for TIAs (staring spells), ended up thinking possible partial seizures.  Head MRI did not show CVA.  Echo in 11/20 showed EF 45-50%, moderately decreased RV systolic function, moderate RV enlargement, moderate-severe MR, moderate TR.    Patient was admitted again in 12/20 with AKI and hypotension in the setting of sinus bradycardia (HR 30s).  She was volume overloaded on exam.  Toprol XL was stopped and HR increased to 60s.  She was diuresed with IV Lasix.  TEE in 12/20 showed EF 50-55%, septal-lateral dyssynchrony, mildly decreased RV function, moderate MR.     Echo in 5/22 showed EF 60-65%, moderate LVH, normal RV, PASP 37 mmHg, 2.5 x 2.1 cm calcified mass posterior mitral annulus with mild-moderate MR => ?severe MAC but more circumscribed and prominent than in the past, mild AS mean gradient 12 mmHg and AVA 1.83 cm^2.  Echo in 11/22 showed EF 55%, normal RV, mass posterior mitral annulus, moderate MR, mild AS, IVC normal.  Cardiac MRI was done in 1/23 to investigate posterior mitral annulus mass; study showed LV EF 48%, mild  LV dilation, normal RV with EF 53%, extensive MAC is likely the source of the posterior mitral annulus mass, basal-mid inferior >50% subendocardial LGE suggestive of prior inferior MI.    Follow up 2/23 she had chest tightness x 4 weeks. Arranged for [facility] which showed in-stent restenosis of pRCA, felt to be the source of her symptoms given occluded SVG-RCA. Underwent PTCA to pRCA. RHC with normal PCWP and RA pressure, mild pulmonary hypertension, preserved CO. On ASA + Plavix + Eliquis x 1 month (until 05/21/21), then Plavix + Eliquis.    Echo 8/24 showed EF significantly worse, down to 30-35%, G1DD, normal RV.    At office visit in 8/24, was having worsening exertional dyspnea as well as chest tightness.  Symptoms were occurring just walking  around the house.  LHC/RHC arranged.  10/22/22, she had chest tightness in short stay requiring NTG and reports frequent dyspnea/chest tightness with less and less exertion at home.  LHC/RHC from 10/22/22 showed mildly elevated PCWP with normal RA pressure, mixed pulmonary venous/pulmonary arterial hypertension, and 99% in-stent restenosis in the proximal RCA, at least 2 layers of stent are present. Dr Shirlee Latch spoke with Dr. Herbie Baltimore, plan to return to cath lab on Monday for cutting balloon angioplasty to ISR in the RCA.  She was admitted over the weekend with unstable symptoms.     Hospital Course     Consultants: N/A   CAD with hx of CABG x5 in 2011  and subsequent multiple PCIs Angina  -Historically: CTO SVG-RCA. S/p opening of CTO RCA with 4 overlapping Xience DES in 5/15. S/p DES to sequential SVG-OM1/PLOM in 9/19. S/p PTCA of ISR of pRCA in 3/23.  - Hs trop 15 >15; LDL was 83 on 08/26/22, will re-send today and follow up at next visit; A1C 6.1% - LHC 10/22/22 for exertional chest pain showed 99% in-stent restenosis in the proximal RCA (known occluded SVG-RCA and has 2 layers of stents). Other coronary disease was stable. She was planned for cutting balloon  angioplasty by Dr Herbie Baltimore, however, post cath course complicated by hypotension, AKI, right groin hematoma. Decision was made to defer staged PCI and continue medical therapy for now.  - Medical therapy: continue PTA Plavix + Eliquis (neuro wanted plavix for CVA); heparin gtt was used and stopped this admission; resume PTA ranolazine 1000mg  BID given resolved AKI now; intolerant to Imdur due to headache;  intolerant to Toprolol XL due to bradycardia in the past; continue PTA Zetia, Crestor and Leqvio and follow up with lipid clinic - post cath care discussed in detail at bedside  - Follow up has been arranged with AHF clinic on 11/05/22  - will need stage PCI for pRCA ISR once acute issue resolves, advised the patient to monitor angina symptoms closely, no escalation of activity, return to ER if symptoms are worsening, she states her exertional chest pain and SOB had been stable and tolerated walking in the hallway fairly today   Right groin hematoma - CTAP from 10/24/22:  Large subcutaneous hematoma within the right groin extending into the proximal right thigh anteromedially, no extension of subcutaneous hemorrhage into the peritoneum or retroperitoneum.  Interval development of a cortical infarct involving the upper pole and posterior interpolar region of the left kidney. - Right groin hematoma has been stable, Hgb 13.3 on 10/22/22, downtrended to 11 range since 10/24/22, over the past 6 days, Hgb had stable around 11, 10.8 today; Eliquis had been resumed since 10/26/2022 - Discussed with the nursing staff today, patient had no bleeding from right groin cath site, she has walked 6 minutes in the hallway without issue today. She has small amount of oozing from a skin tear on her abdominal folds, where dressing was applied.  - Given overall stable appearance of right groin hematoma, OK to release home today, post cath site care discussed in detail at bedside   AKI, improving  - Cr 0.83 /0.99 on 10/22/22,  peaked 2.72 on 10/24/22, downtrended to 1.02 yesterday, 1.16 today  - etiology unclear, CTAP 9/22 showed interval development of a cortical infarct involving the upper pole and posterior interpolar region of the left kidney; renal ultrasound from 10/25/2022 without hydronephrosis; suspect post cath hypotension/hypoperfusion versus atheroembolism associated with cath, minimal dye was used for [facility], home diuretic  and ranlozine were held, urinalysis unremarkable for infection, renal index improved fortunately -Home medication ranolazine and spironolactone can be resumed, continue hold torsemide -Please check BMP at follow-up visit on 11/05/2022, if renal function remains stable, may resume torsemide/Entresto  Chronic systolic and diastolic heart failure  Mixed etiology of cardiomyopathy -Follows advanced heart failure clinic, echocardiogram 2019 with LVEF 55 to 60%, moderate diastolic dysfunction. Cardiomems placed 01/16/18.  Echo in 3/20 in setting of severe PNA with intubation showed EF 25-30%, mid-apical LV severe hypokinesis.  Possible stress (Takotsubo-type) cardiomyopathy related to severe medical illness versus progression of CAD.  She has baseline chronic LBBB which could play a role in cardiomyopathy as well (LBBB cardiomyopathy).  Repeat echo in 11/20 with EF up to 45-50%, moderately dysfunctional RV.  TEE in 12/20 with EF 50-55% with septal-lateral dyssynchrony and mildly dysfunctional RV.  Echo in 11/22 with EF 55% with normal RV.  cMRI in 1/23 with EF 48% and evidence for prior inferior MI.  Most recent echo in 8/24 with fall in EF to 30-35%, normal RV. This may be due to severe RCA disease.  Ongoing NYHA class III-IIIb symptoms.  Mildly elevated PCWP on cath with normal RA pressure.  -Clinically she remains euvolemic on exam today, weight has been stable 226.85 >225.75 ib since admission -GDMT: Due to AKI, PTA Entresto and spironolactone and torsemide were held; historically cannot tolerate  SGLT2i; historically not tolerated beta-blocker due to bradycardia; AKI is much improved, BP is overall soft, spironolactone resumed, will continue hold Entresto and torsemide until next follow-up appointment on 11/05/2022 with advanced heart failure clinic -Will need staged PCI for proximal RCA, if EF remains down, consider CRT implant  Paroxysmal A fib Left bundle branch block -Historically on amiodarone, had suspected pulmonary toxicity and was taken off -She remains in sinus rhythm at this time -Historically not tolerated beta-blocker due to bradycardia -Continue PTA Eliquis 5 mg twice daily  Hypertension -Home medication amlodipine 5 mg had been stopped at this admission -Continue spironolactone -Can reassess blood pressure at the next follow-up appointment  CVA Carotid stenosis -History of CVA with left carotid stent 03/2013 -Continue Plavix and Crestor -Follow-up with neurology as previously scheduled  OHS/OSA -Cannot tolerate CPAP, uses oxygen at night PTA  Debility -She was assessed by PT, recommend home health PT  Acute on chronic hypoxic respiratory failure -Patient has profound weakness and debility over the past 8 days of hospitalization, she is able to walk 6 minutes in the hallway today, with stable angina/CHF symptoms, pulse ox 90% on room air at rest, 75% with ambulation, 98% with 2 L nasal cannula oxygen application, she is qualified for home oxygen which we will provide a new order for this; will need continue ambulation at home and wean off oxygen as tolerated at home  Depression Insomnia Type 2 diabetes with neuropathy GERD -On a large amount of antipsychotic medication, no change made to chronic home medication, please follow-up with PCP as previously scheduled, consider avoid polypharmacy     Did the patient have an acute coronary syndrome (MI, NSTEMI, STEMI, etc) this admission?:  No                               Did the patient have a percutaneous coronary  intervention (stent / angioplasty)?:  No.        The patient will be scheduled for a TOC follow up appointment in 5 days.  A message  has been sent to the [facility] and Scheduling Pool at the office where the patient should be seen for follow up.  _____________  Discharge Vitals Blood pressure (!) 119/38, pulse (!) 53, temperature 98.6 F (37 C), temperature source Oral, resp. rate 14, height 5\' 2"  (1.575 m), weight 102.4 kg, SpO2 93%.  Filed Weights   10/28/22 0334 10/29/22 0500 10/30/22 0510  Weight: 101.2 kg 100.6 kg 102.4 kg   Physical exam:  Alert and oriented x 3, no cognitive deficit, maintaining conversation appropriately, resting breathing at ease Right groin with dressing in place, large amount of ecchymosis noted, no active oozing of blood Abdominal folds with a small skin tear and small amount of oozing noted dressing Further exam please see attending progress notes today  Labs & Radiologic Studies    CBC Recent Labs    10/29/22 0543 10/30/22 0220  WBC 8.4 8.1  HGB 11.7* 10.8*  HCT 35.7* 32.6*  MCV 96.2 93.4  PLT 126* 133*   Basic Metabolic Panel Recent Labs    40/98/11 0543 10/30/22 0220  NA 137 135  K 4.4 4.2  CL 102 98  CO2 28 28  GLUCOSE 146* 120*  BUN 18 19  CREATININE 1.02* 1.16*  CALCIUM 8.7* 8.7*   Liver Function Tests No results for input(s): "AST", "ALT", "ALKPHOS", "BILITOT", "PROT", "ALBUMIN" in the last 72 hours. No results for input(s): "LIPASE", "AMYLASE" in the last 72 hours. High Sensitivity Troponin:   Recent Labs  Lab 10/22/22 1717 10/22/22 1749  TROPONINIHS 15 15    BNP Invalid input(s): "POCBNP" D-Dimer No results for input(s): "DDIMER" in the last 72 hours. Hemoglobin A1C No results for input(s): "HGBA1C" in the last 72 hours. Fasting Lipid Panel No results for input(s): "CHOL", "HDL", "LDLCALC", "TRIG", "CHOLHDL", "LDLDIRECT" in the last 72 hours. Thyroid Function Tests No results for input(s): "TSH", "T4TOTAL",  "T3FREE", "THYROIDAB" in the last 72 hours.  Invalid input(s): "FREET3" _____________  US RENAL  Result Date: 10/25/2022 CLINICAL DATA:  Acute kidney injury EXAM: RENAL / URINARY TRACT ULTRASOUND COMPLETE COMPARISON:  10/24/2022 FINDINGS: Right Kidney: Renal measurements: 10.2 by 4.7 by 5.9 cm = volume: 149 mL. Echogenicity within normal limits. No mass or hydronephrosis visualized. Left Kidney: Renal measurements: 8.0 by 4.4 by 4.0 cm = volume: 75 mL. Thin cortex in the upper pole. The angiomyolipoma shown on the 09/22 CT scan was not appreciated sonographically. No hydronephrosis. Bladder: Not well appreciated. Other: None. IMPRESSION: 1. No hydronephrosis. 2. The left kidney is smaller than the right, with thin cortex in the upper pole possibly related to prior renal infarct. 3. The angiomyolipoma shown on the 09/22 CT scan was not appreciated sonographically. Electronically Signed   By: Gaylyn Rong M.D.   On: 10/25/2022 17:41   CT ABDOMEN PELVIS WO CONTRAST  Result Date: 10/24/2022 CLINICAL DATA:  Retroperitoneal hemorrhage, right groin hematoma EXAM: CT ABDOMEN AND PELVIS WITHOUT CONTRAST TECHNIQUE: Multidetector CT imaging of the abdomen and pelvis was performed following the standard protocol without IV contrast. RADIATION DOSE REDUCTION: This exam was performed according to the departmental dose-optimization program which includes automated exposure control, adjustment of the mA and/or kV according to patient size and/or use of iterative reconstruction technique. COMPARISON:  01/12/2022 FINDINGS: Lower chest: No acute abnormality. Hepatobiliary: Status post cholecystectomy. Stable moderate extrahepatic biliary ductal dilation likely representing post cholecystectomy change. No intrahepatic biliary ductal dilation. The liver is unremarkable on this noncontrast examination. Pancreas: Unremarkable Spleen: Unremarkable Adrenals/Urinary Tract: The adrenal glands are unremarkable.  The right kidney  is normal in size and position. Asymmetric left renal cortical atrophy. Interval development of a cortical infarct involving the upper pole and posterior interpolar region of the left kidney. Stable 20 mm largely lipomatous angiomyolipoma within the upper pole of the left kidney for which no follow-up imaging is recommended. Normal cortical enhancement of the right kidney. No hydronephrosis. No intrarenal or ureteral calculi. The bladder is unremarkable. Stomach/Bowel: Surgical staple lines are seen involving the stomach and mid small bowel which may relate to prior gastric bypass and subsequent takedown. Mild sigmoid diverticulosis. The stomach, small bowel, and large bowel are otherwise unremarkable. No evidence of obstruction or focal inflammation. No free intraperitoneal gas or fluid. The appendix is normal. Vascular/Lymphatic: Extensive aortoiliac atherosclerotic calcification. Particularly prominent atherosclerotic calcification of the origin of the superior mesenteric and inferior mesenteric arteries. The degree of stenosis is not well assessed on this non arteriographic study. No aortic aneurysm. No pathologic adenopathy within the abdomen and pelvis. Reproductive: Status post hysterectomy. No adnexal masses. Other: No retroperitoneal hematoma identified. No abdominal wall hernia. Infiltrative high attenuation fluid is seen within the right groin extending into the proximal right thigh anteromedially, incompletely included on this examination in keeping with a large subcutaneous hematoma. Subcutaneous hemorrhage does not extend into the peritoneum or retroperitoneum. Musculoskeletal: No acute bone abnormality. Osseous structures are age-appropriate. IMPRESSION: 1. Large subcutaneous hematoma within the right groin extending into the proximal right thigh anteromedially, incompletely included on this examination. No extension of subcutaneous hemorrhage into the peritoneum or retroperitoneum. 2. Interval  development of a cortical infarct involving the upper pole and posterior interpolar region of the left kidney. 3. Extensive aortoiliac atherosclerotic calcification. Particularly prominent atherosclerotic calcification of the origin of the superior mesenteric and inferior mesenteric arteries. The degree of stenosis is not well assessed on this non arteriographic study. 4. Mild sigmoid diverticulosis. Aortic Atherosclerosis (ICD10-I70.0). Electronically Signed   By: Helyn Numbers M.D.   On: 10/24/2022 19:53   CARDIAC CATHETERIZATION  Result Date: 10/22/2022   Ost LAD to Prox LAD lesion is 90% stenosed.   Prox LAD lesion is 100% stenosed.   Ost Cx to Prox Cx lesion is 70% stenosed.   Mid RCA to Dist RCA lesion is 40% stenosed.   Prox RCA to Mid RCA lesion is 70% stenosed.   Origin lesion is 100% stenosed.   1st Mrg lesion is 100% stenosed.   3rd Mrg lesion is 100% stenosed.   Ost RCA to Prox RCA lesion is 50% stenosed.   Prox Graft lesion is 30% stenosed.   Origin to Prox Graft lesion is 30% stenosed.   Prox RCA lesion is 99% stenosed. 1. Mildly elevated PCWP and LVEDP 2. Mixed pulmonary venous/pulmonary arterial hypertension, moderate.  PVR 3 WU. 3. Preserved cardiac output. 4. Known occluded SVG-PDA.  Native RCA has stents extending from the ostium to the distal vessel.  99% in-stent restenosis in the proximal RCA, at least 2 layers of stent are present. Discussed with Dr. Herbie Baltimore, will plan on cutting balloon angioplasty to this lesion on Monday.  However, long-term, this vessel will remain very tenuous.  Will admit over weekend due to unstable angina.   Disposition   Patient is seen by Dr. Jenene Slicker today, deemed stable for discharge to home.  She had right groin hematoma that has been stable over the past 8 days hospitalization.  There is a skin tear of her abdominal folds where she had some oozing of blood, this was treated with  pressure dressing.  She has tolerated 6-minute walking test today without  problem.  She is overall deconditioned and weak, but felt able to go home, felt her angina and CHF symptoms are stable.  Medication change, follow-up plan, post-cath care discussed in detail at bedside. Pt is being discharged home today in good condition.    Follow-up Plans & Appointments     Follow-up Information     Mosetta Putt, MD Follow up.   Specialty: Family Medicine Why: please call to arrange follow up hospital appt Contact information: [address] [place] [address] [phone]         Home, Medi Follow up.   Why: Home Health RN and Physical Therapy-agency will call to arrange visits Contact information: [address] [place] [address] [phone]         [facility] Follow up on 11/05/2022.   Specialty: Cardiology Why: at 11am for your advanced heart failure follow-up appointment Contact information: [address] [place] [address] [phone]               Discharge Instructions     Diet - low sodium heart healthy   Complete by: As directed    Discharge instructions   Complete by: As directed    Groin Site Care  Refer to this sheet in the next few weeks. These instructions provide you with information on caring for yourself after your procedure. Your caregiver may also give you more specific instructions. Your treatment has been planned according to current medical practices, but problems sometimes occur. Call your caregiver if you have any problems or questions after your procedure.  HOME CARE INSTRUCTIONS You may shower 24 hours after the procedure. Remove the bandage (dressing) and gently wash the site with plain soap and water. Gently pat the site dry.  Do not apply powder or lotion to the site.  Do not sit in a bathtub, swimming pool, or whirlpool for 5 to 7 days.  No bending, squatting, or lifting anything over 10 pounds (4.5 kg) as directed by your  caregiver.  Inspect the site at least twice daily.  Do not drive home if you are discharged the same day of the procedure. Have someone else drive you.  You may drive 24 hours after the procedure unless otherwise instructed by your caregiver.   What to expect: Any bruising will usually fade within 1 to 2 weeks.  Blood that collects in the tissue (hematoma) may be painful to the touch. It should usually decrease in size and tenderness within 1 to 2 weeks.   SEEK IMMEDIATE MEDICAL CARE IF: You have unusual pain at the groin site or down the affected leg.  You have redness, warmth, swelling, or pain at the groin site.  You have drainage (other than a small amount of blood on the dressing).  You have chills.  You have a fever or persistent symptoms for more than 72 hours.  You have a fever and your symptoms suddenly get worse.  Your leg becomes pale, cool, tingly, or numb.  You have heavy bleeding from the site. Hold pressure on the site. Marland Kitchen    PLEASE REMEMBER TO BRING ALL OF YOUR MEDICATIONS TO EACH OF YOUR FOLLOW-UP OFFICE VISITS.  PLEASE ATTEND ALL SCHEDULED FOLLOW-UP APPOINTMENTS.   Activity: Increase activity slowly as tolerated. You may shower, but no soaking baths (or swimming) for 1 week. No driving for 24 hours. No lifting over  5 lbs for 1 week. No sexual activity for 1 week.   Wound Care: You may wash cath site gently with soap and water. Keep cath site clean and dry. If you notice pain, swelling, bleeding or pus at your cath site, please call [phone].   Discharge wound care:   Complete by: As directed    Apply dressing to abdominal skin tear, monitor for bleeding/pus/tenderness/redness   Increase activity slowly   Complete by: As directed         Discharge Medications   Allergies as of 10/30/2022       Reactions   Amoxicillin Shortness Of Breath, Rash   Brilinta [ticagrelor] Shortness Of Breath   Erythromycin Shortness Of Breath, Other (See Comments), Hives    Trouble swallowing   Flagyl [metronidazole] Shortness Of Breath, Palpitations   Penicillins Hives, Shortness Of Breath, Rash, Other (See Comments)   Has patient had a PCN reaction causing immediate rash, facial/tongue/throat swelling, SOB or lightheadedness with hypotension: Yes Has patient had a PCN reaction causing severe rash involving mucus membranes or skin necrosis: No Has patient had a PCN reaction that required hospitalization: Yes Has patient had a PCN reaction occurring within the last 10 years: No If all of the above answers are "NO", then may proceed with Cephalosporin use.   Isosorbide Mononitrate [isosorbide Nitrate] Other (See Comments)   Joint aches, muscles hurt, difficult to walk   Jardiance [empagliflozin] Other (See Comments)   Nausea, joint aches, muscles aches   Metformin And Related Other (See Comments)   Stomach pain, cold sweats, joint pain, burred vision, dizziness   Tape Other (See Comments)   Skin pulls off with certain types Plastic tape causes skin to rip if left on for long periods of time        Medication List     STOP taking these medications    amLODipine 5 MG tablet Commonly known as: NORVASC   Entresto 97-103 MG Generic drug: sacubitril-valsartan   torsemide 10 MG tablet Commonly known as: DEMADEX       TAKE these medications    acetaminophen 500 MG tablet Commonly known as: TYLENOL Take 500 mg by mouth every 6 (six) hours as needed for mild pain, moderate pain, fever or headache.   albuterol 108 (90 Base) MCG/ACT inhaler Commonly known as: VENTOLIN HFA Inhale 2 puffs into the lungs every 4 (four) hours as needed for wheezing or shortness of breath.   ALPRAZolam 0.5 MG tablet Commonly known as: XANAX Take 0.5-1 mg by mouth See admin instructions. Take 0.5 mg at lunch and 1 mg at bedtime   apixaban 5 MG Tabs tablet Commonly known as: Eliquis Take 1 tablet (5 mg total) by mouth 2 (two) times daily.   B-complex with vitamin C  tablet Take 1 tablet by mouth in the morning.   baclofen 10 MG tablet Commonly known as: LIORESAL Take 10 mg by mouth at bedtime.   bisacodyl 5 MG EC tablet Commonly known as: DULCOLAX Take 5 mg by mouth daily as needed for moderate constipation.   buPROPion 300 MG 24 hr tablet Commonly known as: WELLBUTRIN XL Take 300 mg by mouth daily.   carboxymethylcellulose 0.5 % Soln Commonly known as: REFRESH PLUS Place 1 drop into both eyes in the morning, at noon, and at bedtime.   clopidogrel 75 MG tablet Commonly known as: PLAVIX Take 75 mg by mouth every morning.   cyanocobalamin 1000 MCG tablet Commonly known as: VITAMIN B12 Take 1,000 mcg by  mouth every morning.   DULoxetine 30 MG capsule Commonly known as: CYMBALTA Take 1 capsule (30 mg total) by mouth daily.   ezetimibe 10 MG tablet Commonly known as: ZETIA Take 1 tablet (10 mg total) by mouth at bedtime.   fluocinonide cream 0.05 % Commonly known as: LIDEX Apply 1 application. topically 2 (two) times daily as needed (irritation).   Fluticasone-Salmeterol 250-50 MCG/DOSE Aepb Commonly known as: ADVAIR Inhale 1 puff into the lungs daily.   gabapentin 600 MG tablet Commonly known as: NEURONTIN Take 600-1,200 mg by mouth See admin instructions. Take 1 tablet (600 mg) by mouth in the morning & take 2 tablets (1200 mg) by mouth at bedtime   inclisiran 284 MG/1.5ML Sosy injection Commonly known as: LEQVIO Inject 284 mg into the skin every 6 (six) months.   insulin lispro 100 UNIT/ML KwikPen Commonly known as: HUMALOG Inject 0-6 Units into the skin See admin instructions. Sliding scale Inject 8 units subcutaneously prior to breakfast and supper; add 4 units for CBG >200   Lantus SoloStar 100 UNIT/ML Solostar Pen Generic drug: insulin glargine Inject 0-8 Units into the skin at bedtime. Per sliding scale   loperamide 2 MG tablet Commonly known as: IMODIUM A-D Take 2 mg by mouth 4 (four) times daily as needed for  diarrhea or loose stools.   multivitamin with minerals Tabs tablet Take 1 tablet by mouth every morning. Centrum - Women over 50   nitroGLYCERIN 0.3 MG SL tablet Commonly known as: NITROSTAT DISSOLVE 1 TABLET UNDER TONGUE EVERY 5 MINUTES FOR UP TO 3 DOSES AS NEEDED FOR CHEST PAIN   Ocuvite Eye Health Formula Caps Take 1 capsule by mouth every morning.   ondansetron 4 MG disintegrating tablet Commonly known as: ZOFRAN-ODT Take 4 mg by mouth 2 (two) times daily.   OXYGEN Inhale 2 L into the lungs at bedtime as needed (shortness of breath).   Ozempic (2 MG/DOSE) 8 MG/3ML Sopn Generic drug: Semaglutide (2 MG/DOSE) Inject 2 mg into the skin once a week.   pantoprazole 40 MG tablet Commonly known as: PROTONIX Take 1 tablet (40 mg total) by mouth daily.   Prolia 60 MG/ML Sosy injection Generic drug: denosumab Inject 60 mg into the skin every 6 (six) months.   pyridOXINE 100 MG tablet Commonly known as: VITAMIN B6 Take 100 mg by mouth every morning.   ranolazine 1000 MG SR tablet Commonly known as: RANEXA TAKE ONE TABLET BY MOUTH TWICE DAILY   rosuvastatin 40 MG tablet Commonly known as: CRESTOR Take 1 tablet (40 mg total) by mouth daily.   spironolactone 25 MG tablet Commonly known as: ALDACTONE TAKE 1 TABLET BY MOUTH DAILY   traZODone 50 MG tablet Commonly known as: DESYREL Take 100-150 mg by mouth at bedtime.   Vitamin D3 50 MCG (2000 UT) capsule Take 2,000 Units by mouth daily in the afternoon.   zolpidem 10 MG tablet Commonly known as: AMBIEN Take 10 mg by mouth at bedtime.               Durable Medical Equipment  (From admission, onward)           Start     Ordered   10/30/22 1303  For home use only DME oxygen  Once       Question Answer Comment  Length of Need 6 Months   Oxygen delivery system Gas      10/30/22 1302  Discharge Care Instructions  (From admission, onward)           Start     Ordered   10/30/22  0000  Discharge wound care:       Comments: Apply dressing to abdominal skin tear, monitor for bleeding/pus/tenderness/redness   10/30/22 1255               Outstanding Labs/Studies    Duration of Discharge Encounter   Greater than 30 minutes including physician time.  Signed, Cyndi Bender, NP 10/30/2022, 1:02 PM   Attending attestation  Patient seen and independently examined with Cyndi Bender, NP. We discussed all aspects of the encounter. I agree with the assessment and plan as stated above.  Briefly, patient admitted for unstable angina, LHC showed proximal RCA ISR and known occlusion of SVG to RPDA. Plan was to perform [place] angioplasty of RCA however post LHC course complicated by AKI, hypotension and new right groin hematoma.  Labs normalized eventually and right groin hematoma was also improving.  Will see her in the outpatient setting and schedule for angioplasty.  Stable for discharge.  Exam showed HEENT normal, JVD unable to examine due to body habitus, S1-S2 normal, clear lungs, abdomen NTND, hematoma in the right groin present, no edema in the legs, AOx3.   Adolphe Fortunato Verne Spurr, MD [place]  [facility]  10:45 AM

## 2022-10-30 NOTE — Progress Notes (Addendum)
Progress Note  Patient Name: [name] [name] Date of Encounter: 10/30/2022  Primary Cardiologist: Lance Muss, MD  Subjective   No acute events overnight, soreness in the right groin increasing day by day.  Inpatient Medications    Scheduled Meds:  ALPRAZolam  0.5 mg Oral Q lunch   And   ALPRAZolam  1 mg Oral QHS   apixaban  5 mg Oral BID   buPROPion  300 mg Oral Daily   clopidogrel  75 mg Oral q morning   cyanocobalamin  1,000 mcg Oral q morning   DULoxetine  30 mg Oral Daily   ezetimibe  10 mg Oral QHS   gabapentin  600 mg Oral q morning   And   gabapentin  1,200 mg Oral QHS   insulin aspart  0-15 Units Subcutaneous TID WC   insulin aspart  0-5 Units Subcutaneous QHS   mometasone-formoterol  2 puff Inhalation BID   ondansetron  4 mg Oral BID   pantoprazole  40 mg Oral Daily   rosuvastatin  40 mg Oral Daily   sodium chloride flush  3 mL Intravenous Q12H   spironolactone  25 mg Oral Daily   traZODone  100 mg Oral QHS   Continuous Infusions:  sodium chloride 10 mL/hr at 10/26/22 1700   sodium chloride     PRN Meds: sodium chloride, acetaminophen, albuterol, bisacodyl, loperamide, nitroGLYCERIN, ondansetron (ZOFRAN) IV, oxyCODONE, sodium chloride flush   Vital Signs    Vitals:   10/30/22 0400 10/30/22 0510 10/30/22 0733 10/30/22 0742  BP: (!) 113/36  (!) 119/41   Pulse: 60  (!) 55   Resp: 15  17   Temp: 98.2 F (36.8 C)  98.2 F (36.8 C)   TempSrc: Oral  Oral   SpO2: 90%  93% 92%  Weight:  102.4 kg    Height:        Intake/Output Summary (Last 24 hours) at 10/30/2022 0819 Last data filed at 10/30/2022 0700 Gross per 24 hour  Intake 360 ml  Output 200 ml  Net 160 ml   Filed Weights   10/28/22 0334 10/29/22 0500 10/30/22 0510  Weight: 101.2 kg 100.6 kg 102.4 kg    Telemetry     Personally reviewed, NSR.  ECG    Not performed today  Physical Exam   GEN: No acute distress.   Neck: No JVD. Cardiac: RRR, no murmur, rub, or gallop.   Respiratory: Nonlabored. Clear to auscultation bilaterally. GI: Soft, nontender, bowel sounds present. MS: No edema; No deformity.  Oozing from the right groin, right groin hematoma noted Neuro:  Nonfocal. Psych: Alert and oriented x 3. Normal affect.  Labs    Chemistry Recent Labs  Lab 10/28/22 0222 10/29/22 0543 10/30/22 0220  NA 136 137 135  K 4.6 4.4 4.2  CL 102 102 98  CO2 27 28 28   GLUCOSE 124* 146* 120*  BUN 21 18 19   CREATININE 1.22* 1.02* 1.16*  CALCIUM 8.8* 8.7* 8.7*  GFRNONAA 47* 58* 49*  ANIONGAP 7 7 9      Hematology Recent Labs  Lab 10/28/22 0222 10/29/22 0543 10/30/22 0220  WBC 8.6 8.4 8.1  RBC 3.61* 3.71* 3.49*  HGB 11.2* 11.7* 10.8*  HCT 34.1* 35.7* 32.6*  MCV 94.5 96.2 93.4  MCH 31.0 31.5 30.9  MCHC 32.8 32.8 33.1  RDW 13.2 13.1 13.2  PLT 127* 126* 133*    Cardiac Enzymes Recent Labs  Lab 10/22/22 1717 10/22/22 1749  TROPONINIHS 15 15  BNPNo results for input(s): "BNP", "PROBNP" in the last 168 hours.   DDimerNo results for input(s): "DDIMER" in the last 168 hours.   Radiology    No results found.  Assessment & Plan   Unstable angina (LHC showed severe ISR of RCA, plan for outpatient PTCA) R groin hematoma CAD s/p multiple PCI, s/p 5V CABG Ischemic cardiomyopathy LVEF 30 to 35% Paroxysmal A-fib, in NSR History of CVA L carotid stent in 2015 Valvular heart disease, mild to moderate MR in 2024   -No chest pains in the last 24 hours, NSR on telemetry. LHC c/w R groin hematoma, pain worsening.  Oozing, likely skin tear.  Apply dressing, sandbag not available. Walking okay for the last few days with no issues.  Okay to be discharged in the afternoon.  Plans for outpatient PTCA of RCA ISR through radial approach. Home O2 order. -Not on BB due to bradycardia, start losartan 25 mg once daily, continue spironolactone 25 mg once daily.  Uptitration of GDMT limited due to soft blood pressures. -Continue Plavix 75 mg once daily and  Eliquis 5 mg twice daily.  Continue rosuvastatin 40 mg nightly and Zetia 10 mg once daily.  Disposition: Home with home health and outpatient PT.  Has been at home, will help her with staff.  Has a chairlift at home, does not have to climb stairs.  Post cath precautions provided.  Signed, Marjo Bicker, MD  10/30/2022, 8:19 AM

## 2022-11-04 NOTE — Progress Notes (Signed)
Date:  11/05/2022   ID:  [name], DOB [birth year], [name] [id number]   Provider location: [facility] Type of Visit: Established patient   PCP:  Mosetta Putt, MD  HF Cardiologist:  Dr. Shirlee Latch  Chief Complaint: Follow up for CAD and CHF.   History of Present Illness: [name] [name] is a 77 y.o. female who has a history of CAD s/p CABG, diastolic CHF, and cerebrovascular disease with history of CVA presents for followup of CHF and diastolic CHF. She had CABG x 5 in 12/11.  Prior to the CABG she had multiple PCIs.  She had a CVA in 2/14 that presented as visual loss.  She had a left carotid stent in 2/15.    Left heart cath done in Sept. 2014. This showed patent SVG-D, LIMA-LAD, and sequential SVG-OM branches but SVG-RCA and the native RCA were both totally occluded. It was felt that her increased symptoms coincided with occlusion of SVG-RCA.  She was started on Imdur to see if this would help with the chest pain and dyspnea.  However, she feels like Imdur causes leg cramps and does not think that she can take it.  So ranolazine 500 mg bid started and titrated this up to 1000 mg bid.  This helped some but not markedly. Therefore, sent to Dr. Eldridge Dace to address opening RCA CTO. He was able to do this in 5/15 with 4 overlapping Xience DES.  This led to resolution of exertional chest pain.     [name] [name] was started on Brilinta after PCI.  She became much more short of breath after starting on Brilinta. Brilinta stopped and replaced with Plavix.  Dyspnea improved off Brilinta. Given increasing exertional dyspnea and chest pain,  RHC/LHC was done in 4/17.  This showed stable CAD with no interventional target.  Left and right heart filling pressures were not significantly increased.  Medical management.    She had an MRI/MRA in May 2018 that showed moderately severe stenosis of the supraclinoid segment of the right internal carotid artery, progressed since the 2016 MR  angiogram, and severe basilar artery stenosis.    She developed recurrent exertional chest pain and had LHC in 6/18, showing 4/5 grafts patent with patent native RCA (similar to prior cath, no changes).     Echo 1/19 with EF 55-60%, moderate diastolic dysfunction, PASP 50 mmHg, mildly dilated RV, mild AS, mild-moderate MR.    She reported increased dyspnea and chest pain and had RHC/LHC in 9/19.  She had DES to sequential SVG-OM1/PLOM.  While hospitalized, she was noted to have runs of atrial fibrillation.  She was very symptomatic with the atrial fibrillation.  Eliquis and amiodarone were started.    She was admitted 12/16-12/19/19 for cardiomems implant and RHC. She was diuresed with IV lasix and transitioned to torsemide 100 mg BID + metolazone once/week. DC weight: 295 lbs.   She had a prolonged hospitalization in 2/20 with acute respiratory failure and fever to 103.  She was thought to have PNA and treated with cefepime.  She was intubated.  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  Amiodarone was stopped and she was put on prednisone. Echo in 2/20 showed EF down to 25-30% with mid-apical LV severe hypokinesis. She had AKI. Possible ITP triggered by infection, HIT negative.  ITP was treated with Solumedrol. She was discharged to SNF.    ORIF left ankle 4/20.   11/20 admission initially with concern for  TIAs (staring spells), ended up thinking possible partial seizures.  Head MRI did not show CVA.  Echo in 11/20 showed EF 45-50%, moderately decreased RV systolic function, moderate RV enlargement, moderate-severe MR, moderate TR.   Patient was admitted again in 12/20 with AKI and hypotension in the setting of sinus bradycardia (HR 30s).  She was volume overloaded on exam.  Toprol XL was stopped and HR increased to 60s.  She was diuresed with IV Lasix.  TEE in 12/20 showed EF 50-55%, septal-lateral dyssynchrony, mildly decreased RV function, moderate MR.   Atypical  chest pain in 1/21, Cardiolite showed EF 57%, fixed inferior defect (likely artifact), no ischemia.   Echo in 5/22 showed EF 60-65%, moderate LVH, normal RV, PASP 37 mmHg, 2.5 x 2.1 cm calcified mass posterior mitral annulus with mild-moderate MR => ?severe MAC but more circumscribed and prominent than in the past, mild AS mean gradient 12 mmHg and AVA 1.83 cm^2.  Echo in 11/22 showed EF 55%, normal RV, mass posterior mitral annulus, moderate MR, mild AS, IVC normal.  Cardiac MRI was done in 1/23 to investigate posterior mitral annulus mass; study showed LV EF 48%, mild LV dilation, normal RV with EF 53%, extensive MAC is likely the source of the posterior mitral annulus mass, basal-mid inferior >50% subendocardial LGE suggestive of prior inferior MI.   Follow up 2/23 she had chest tightness x 4 weeks. Arranged for [facility] which showed in-stent restenosis of pRCA, felt to be the source of her symptoms given occluded SVG-RCA. Underwent PTCA to pRCA. RHC with normal PCWP and RA pressure, mild pulmonary hypertension, preserved CO. On ASA + Plavix + Eliquis x 1 month (until 05/21/21), then Plavix + Eliquis.   Echo 8/24 showed EF significantly worse, down to 30-35%, G1DD, normal RV.   Admitted after elective L/RHC after she reported worsening exertional dyspnea and chest tightness.  LHC/RHC showed mildly elevated PCWP with normal RA pressure, mixed pulmonary venous/pulmonary arterial hypertension, and 99% in-stent restenosis in the proximal RCA, at least 2 layers of stent are present. Admitted for cutting balloon angioplasty to ISR in the RCA. Stay complicated by AKI and acute hematoma at cath site with hypotension. Decision was made to defer staged PCI and continue medical therapy for now.   Today she returns for post hospital follow up. Overall feeling ok. Denies palpitations, edema, or PND/Orthopnea. Has felt dizzy recently. SOB with prolonged conversation. Appetite not too good. No fever or chills. Weight at  home 221 pounds. Taking all medications . BP at home 160-110, checks it at night around 10pm. Has persistent chest pain 4/10.   ECG (personally reviewed): NSR with sinus arrhythmia, LBBB 72 bpm  Cardiomems today 10/01/22 PAD 11, goal 10  Labs (1/19): K 4.9, creatinine 1.22 Labs (9/19): LDL 108 Labs (10/19): hgb 95.2, K 4.3, creatinine 1.03 Labs 12/06/2017: K 4.7 Creatinine 1.7 Labs (3/20): K 4.8, creatinine 1.39, hgb 12.4 plts 226 Labs (11/20): K 4.2, creatinine 1.01, LDL 38 Labs (12/20): K 3.5, creatinine 1.8 Labs (3/21): K 4.5, creatinine 1.5 Labs (2/22): K 4.2, creatinine 1.13 Labs (4/22): LDL 58 Labs (9/22): K 4.3, creatinine 1.27 Labs (12/22): K 4.4, creatinine 1.13 Labs (3/23): K 4.3, creatinine 1.02 Labs (5/23): K 4.3, creatinine 1.33 Labs (6/23): BNP 140, K 4.5, creatinine 0.98 Labs (10/23): K 4.2, creatinine 0.86 Labs (12/23): K 4.4, creatinine 0.90, LDL 111 Labs (3/24): BNP 163, K 4.7, creatinine 0.9 Labs (7/24): K 4.9, creatinine 0.95, LDL 83 Labs (9/24): K 4.2, SCr 1.16, LDL  25   PMH: 1. Diabetic gastroparesis 2. Type II diabetes 3. HTN 4. Morbid obesity 5. CAD: s/p CABG in 12/11 after prior PCIs.  LIMA-LAD, SVG-D, seq SVG-OM1 and OM2, SVG-PDA. Adenosine Cardiolite (8/14) with EF 53% and a small reversible apical defect with a medium, partially reversible inferior defect.  LHC (9/14) with patent SVG-D, LIMA-LAD (50% distal LAD), and sequential SVG-OM branches; the SVG-RCA and the native RCA were occluded.  This was managed medically initially, but with ongoing exertional chest pain, it was decided to open CTO.  Patient had CTO opening with 4 overlapping Xience DES in the RCA in 5/15.   - LHC (4/17): SVG-D patent, LIMA-LAD patent, distal LAD with several 40-50% stenoses, sequential SVG-OM1 and PLOM patent with 50% proximal stenosis (not flow limiting), SVG-RCA TO with patent RCA stents.  - LHC (6/18): 4/5 grafts patent (SVG-RCA TO, same as past), RCA stents patent => no  change.  - LHC (9/19): Sequential SVG-PLOM/OM1 with 80% proximal stenosis, s/p DES.  - Cardiolite (1/21): EF 57%, fixed inferior defect (likely artifact), no ischemia.  - LHC (3/23): severe CAD with 90% in-stent restenosis of pRCA, s/p PTCA to in-stent restenosis of pRCA -  LHC 9/24  99% in-stent restenosis in the proximal RCA, at least 2 layers of stent are present  6. Atypical migraines 7. OHS/OSA: Intolerant of CPAP. Uses oxygen with exertion and at night.  8. GERD with hiatal hernia 9. OA 10. HFpEF: She has a Cardiomems device.  Echo (11/13) with EF 50-55%, grade II diastolic dysfunction, mild-moderate MR.  Echo (8/14) with EF 55-60%, grade II diastolic dysfunction, mildly increased aortic valve gradient (mean 12 mmHg) but valve opens well, mild MR and mild RV dilation.  Echo (9/15) with EF 60-65%, grade II diastolic dysfunction, mild aortic stenosis, mild mitral stenosis, mild to moderate mitral regurgitation, mildly dilated RV with normal systolic function, PA systolic pressure 46 mmHg.  - RHC (4/17): mean RA 9, PA 38/15 mean 27, mean PCWP 9, CI 2.5.  - Echo (5/17): EF 55-60%, mild LVH, mildly dilated RV with low normal systolic function, moderate TR, PASP 61 mmHg - Echo (1/19): EF 55-60%, moderate diastolic dysfunction, PASP 50 mmHg, mildly dilated RV, mild AS, mild-moderate MR. - RHC (9/19): mean RA 11, PA 34/12, mean PCWP 15, CI 2.85 - Echo (3/20): EF 25-30% with mid-apical severe hypokinesis.  - Echo (11/20): EF 45-50%, moderately dilated RV with moderately decreased systolic function, moderate-severe MR, moderate TR.  - TEE (12/20): EF 50-55%, septal-lateral dyssynchrony, moderate RV dilation/mildly decreased function, peak RV-RA gradient 51 mmHg, moderate MR.  - Echo (5/22): EF 60-65%, moderate LVH, normal RV, PASP 37 mmHg, 2.5 x 2.1 cm calcified mass posterior mitral annulus with mild-moderate MR => ?severe MAC but more circumscribed and prominent than in the past, mild AS mean gradient  12 mmHg and AVA 1.83 cm^2.  - Echo (11/22): EF 55%, normal RV, mass posterior mitral annulus, moderate MR, mild AS, IVC normal. - Cardiac MRI in 1/23 showed LV EF 48%, mild LV dilation, normal RV with EF 53%, extensive MAC is likely the source of the posterior mitral annulus mass, basal-mid inferior >50% subendocardial LGE suggestive of prior inferior MI.  - RHC (3/23): normal PCWP (14) and RA pressure (2) , mild pulmonary hypertension, preserved CO/CI (6.51/3.28). - Echo (7/24): EF 30-35%, G1DD, normal RV.  -  LHC/RHC 9/24 showed mildly elevated PCWP with normal RA pressure, mixed pulmonary venous/pulmonary arterial hypertension, and 99% in-stent restenosis in the proximal RCA, at  least 2 layers of stent are present  11. CKD 12. Chronic LBBB 13. Anxiety 14. Carotid stenosis: Followed by [facility], >80% LICA stenosis 12/14.  She had left carotid stent in 2/15. Carotid dopplers 8/15 with no significant disease. Carotid dopplers (4/16): < 40% RICA stenosis.  - Carotid dopplers (9/22): 1-39% BICA stenosis.  15. Cerebrovascular disease: CVA 2/14 with right posterior cerebral artery territory ischemic infarction. Cerebral angiogram in 6/14 showed 70% right vertebral ostial stenosis, 60-65% LICA stenosis, > 70% proximal left posterior cerebral artery stenosis, posterior communicating artery aneurysm.  Patient has had episodes of transient expressive aphasia.  Carotid dopplers (12/14) showed >80% LICA stenosis. She had a left carotid stent 2/15.  Possible CVA in 7/15. - MRI/MRA in May 2018 that showed moderately severe stenosis of the supraclinoid segment of the right internal carotid artery, progressed since the 2016 MR angiogram and severe basilar artery stenosis.  16. Positional vertigo (suspected) 17. Palpitations: Holter (6/15) with rare PVCs/PACs.  - Event monitor (2/18): NSR, occasional PVCs - 14 day Zio (9/23) mostly NSR, rare PVCs and PACs, few short runs of SVT, no atrial fibrillation or worrisome  arrhythmias. 18. Dyspnea with Brilinta. 19. Melanoma: On face, s/p excision.   20. Aortic stenosis: Mild on 11/22 echo.  21. Posterior mitral annular mass: Cardiac MRI was done in 1/23 to investigate posterior mitral annulus mass; study showed LV EF 48%, mild LV dilation, normal RV with EF 53%, extensive MAC is likely the source of the posterior mitral annulus mass, basal-mid inferior >50% subendocardial LGE suggestive of prior inferior MI.  22. Lower extremity arterial dopplers (9/16) were normal.  23. Sleep study (4/18): No significant OSA.  24. Atrial fibrillation: Paroxysmal - Amiodarone stopped, ?lung toxicity.  25. H/o ITP in 3/20.  26. Partial seizures 27. Mitral regurgitation: Moderate on TEE in 12/20.  - Mild to moderate on 5/22 echo.  - Moderate on 11/22 echo  Current Outpatient Medications  Medication Sig Dispense Refill   acetaminophen (TYLENOL) 500 MG tablet Take 500 mg by mouth every 6 (six) hours as needed for mild pain, moderate pain, fever or headache.     albuterol (VENTOLIN HFA) 108 (90 Base) MCG/ACT inhaler Inhale 2 puffs into the lungs every 4 (four) hours as needed for wheezing or shortness of breath.      ALPRAZolam (XANAX) 0.5 MG tablet Take 0.5-1 mg by mouth See admin instructions. Take 0.5 mg at lunch and 1 mg at bedtime     apixaban (ELIQUIS) 5 MG TABS tablet Take 1 tablet (5 mg total) by mouth 2 (two) times daily. 180 tablet 3   B Complex-C (B-COMPLEX WITH VITAMIN C) tablet Take 1 tablet by mouth in the morning.     baclofen (LIORESAL) 10 MG tablet Take 10 mg by mouth at bedtime.     bisacodyl (DULCOLAX) 5 MG EC tablet Take 5 mg by mouth daily as needed for moderate constipation.     buPROPion (WELLBUTRIN XL) 300 MG 24 hr tablet Take 300 mg by mouth daily.     carboxymethylcellulose (REFRESH PLUS) 0.5 % SOLN Place 1 drop into both eyes in the morning, at noon, and at bedtime.     Cholecalciferol (VITAMIN D3) 50 MCG (2000 UT) capsule Take 2,000 Units by mouth  daily in the afternoon.     clopidogrel (PLAVIX) 75 MG tablet Take 75 mg by mouth every morning.     denosumab (PROLIA) 60 MG/ML SOSY injection Inject 60 mg into the skin every 6 (six) months.  DULoxetine (CYMBALTA) 30 MG capsule Take 1 capsule (30 mg total) by mouth daily. 30 capsule 0   ezetimibe (ZETIA) 10 MG tablet Take 1 tablet (10 mg total) by mouth at bedtime. 90 tablet 3   fluocinonide cream (LIDEX) 0.05 % Apply 1 application. topically 2 (two) times daily as needed (irritation).     Fluticasone-Salmeterol (ADVAIR) 250-50 MCG/DOSE AEPB Inhale 1 puff into the lungs daily.     gabapentin (NEURONTIN) 600 MG tablet Take 600-1,200 mg by mouth See admin instructions. Take 1 tablet (600 mg) by mouth in the morning & take 2 tablets (1200 mg) by mouth at bedtime     inclisiran (LEQVIO) 284 MG/1.5ML SOSY injection Inject 284 mg into the skin every 6 (six) months.     Insulin Glargine (LANTUS SOLOSTAR) 100 UNIT/ML Solostar Pen Inject 0-8 Units into the skin at bedtime. Per sliding scale     insulin lispro (HUMALOG) 100 UNIT/ML KwikPen Inject 0-6 Units into the skin See admin instructions. Sliding scale Inject 8 units subcutaneously prior to breakfast and supper; add 4 units for CBG >200     loperamide (IMODIUM A-D) 2 MG tablet Take 2 mg by mouth 4 (four) times daily as needed for diarrhea or loose stools.     Multiple Vitamin (MULTIVITAMIN WITH MINERALS) TABS tablet Take 1 tablet by mouth every morning. Centrum - Women over 50     Multiple Vitamins-Minerals (OCUVITE EYE HEALTH FORMULA) CAPS Take 1 capsule by mouth every morning.     nitroGLYCERIN (NITROSTAT) 0.3 MG SL tablet DISSOLVE 1 TABLET UNDER TONGUE EVERY 5 MINUTES FOR UP TO 3 DOSES AS NEEDED FOR CHEST PAIN 100 tablet 0   ondansetron (ZOFRAN-ODT) 4 MG disintegrating tablet Take 4 mg by mouth 2 (two) times daily.     OXYGEN Inhale 2 L into the lungs at bedtime as needed (shortness of breath).      pantoprazole (PROTONIX) 40 MG tablet Take 1  tablet (40 mg total) by mouth daily. 30 tablet 0   pyridOXINE (VITAMIN B6) 100 MG tablet Take 100 mg by mouth every morning.     ranolazine (RANEXA) 1000 MG SR tablet TAKE ONE TABLET BY MOUTH TWICE DAILY 180 tablet 3   rosuvastatin (CRESTOR) 40 MG tablet Take 1 tablet (40 mg total) by mouth daily. 90 tablet 3   Semaglutide, 2 MG/DOSE, (OZEMPIC, 2 MG/DOSE,) 8 MG/3ML SOPN Inject 2 mg into the skin once a week. 3 mL 11   spironolactone (ALDACTONE) 25 MG tablet TAKE 1 TABLET BY MOUTH DAILY 90 tablet 3   traZODone (DESYREL) 50 MG tablet Take 100-150 mg by mouth at bedtime.     vitamin B-12 (CYANOCOBALAMIN) 1000 MCG tablet Take 1,000 mcg by mouth every morning.      zolpidem (AMBIEN) 10 MG tablet Take 10 mg by mouth at bedtime.     No current facility-administered medications for this encounter.   Allergies:   Amoxicillin, Brilinta [ticagrelor], Erythromycin, Flagyl [metronidazole], Penicillins, Isosorbide mononitrate [isosorbide nitrate], Jardiance [empagliflozin], Metformin and related, and Tape   Social History:  The patient  reports that she has never smoked. She has never used smokeless tobacco. She reports that she does not drink alcohol and does not use drugs.   Family History:  The patient's family history includes AAA (abdominal aortic aneurysm) in her father and mother; Cancer in her sister; Deep vein thrombosis in her father and mother; Dementia in her maternal grandmother and mother; Diabetes in her father, paternal aunt, paternal grandmother, paternal uncle, and  paternal uncle; Heart attack in her father; Heart attack (age of onset: 1) in her paternal grandfather; Heart disease in her father and paternal uncle; Hyperlipidemia in her father; Hypertension in her father, mother, and sister; Stroke in her maternal grandmother, paternal grandmother, and paternal uncle.   ROS:  Please see the history of present illness.   All other systems are personally reviewed and negative.   Recent  Labs: 10/22/2022: ALT 20; B Natriuretic Peptide 133.9; TSH 0.750 10/25/2022: Magnesium 2.2 10/30/2022: BUN 19; Creatinine, Ser 1.16; Hemoglobin 10.8; Platelets 133; Potassium 4.2; Sodium 135  Personally reviewed   Wt Readings from Last 3 Encounters:  11/05/22 100.6 kg (221 lb 12.8 oz)  10/30/22 102.4 kg (225 lb 12 oz)  10/01/22 105.1 kg (231 lb 9.6 oz)    BP 122/60   Pulse (!) 53   Wt 100.6 kg (221 lb 12.8 oz)   SpO2 94%   BMI 40.57 kg/m   Physical Exam:   General:  elderly appearing.  No respiratory difficulty. Walked in with walker.  HEENT: normal Neck: supple. JVD ~7 cm. Carotids 2+ bilat; no bruits. No lymphadenopathy or thyromegaly appreciated. Cor: PMI nondisplaced. Regular rate & rhythm. No rubs, gallops or murmurs. Lungs: clear Abdomen: obese, soft, nontender, nondistended. No hepatosplenomegaly. No bruits or masses. Good bowel sounds. Extremities: no cyanosis, clubbing, rash, edema. R groin hematoma with ecchymosis and still slightly indurated Neuro: alert & oriented x 3, cranial nerves grossly intact. moves all 4 extremities w/o difficulty. Affect pleasant.   Assessment & Plan: 1. CAD: Occluded native RCA and SVG-RCA.  Status post opening of CTO RCA with 4 overlapping Xience DES in 5/15.  DES to sequential SVG-OM1/PLOM in 9/19. LHC in 3/23 showed severe native coronary disease with 90% in-stent restenosis in the proximal RCA (patient has 2 layers of stent at this site). The sequential SVG-OM1/PLOM was patent, SVG-D was patent, and LIMA-LAD was patent. She underwent PTCA of pRCA. She has chronic angina, however exertional dyspnea has been more prominent. Recent echo showed EF further reduced, now 30-35%. Cath 9/24 showed 99% in-stent restenosis in the proximal RCA (known occluded SVG-RCA). Other coronary disease was stable. PCI deferred with AKI during last admission, plan for medical management and monitor for chest pain reoccurrence.  - Continue apixaban and Plavix (has been on  chronic Plavix, neuro has wanted her to stay on this for cerebrovascular disease).   - Continue ranolazine 1000 mg bid.  - Off Imdur with headache. - Off Toprol XL with bradycardia.   - With persistent CP will refer back to interventional cardiology to revisit PCI. SCr recently stable. Labs today.  2. Chronic systolic => diastolic CHF: Echo in 1/19 with EF 55-60%, moderate diastolic dysfunction. Cardiomems placed 01/16/18.  Echo in 3/20 in setting of severe PNA with intubation showed EF 25-30%, mid-apical LV severe hypokinesis.  Possible stress (Takotsubo-type) cardiomyopathy related to severe medical illness versus progression of CAD.  She has baseline chronic LBBB which could play a role in cardiomyopathy as well (LBBB cardiomyopathy).  Repeat echo in 11/20 with EF up to 45-50%, moderately dysfunctional RV.  TEE in 12/20 with EF 50-55% with septal-lateral dyssynchrony and mildly dysfunctional RV.  Echo in 11/22 with EF 55% with normal RV.  cMRI in 1/23 with EF 48% and evidence for prior inferior MI.  Echo 7/24 30-35%, G1DD, normal RV.  Ongoing NYHA class III-IIIb symptoms.  - volume stable, discussed taking her torsemide PRN - Cleda Daub and Sherryll Burger previously held with AKI. Will hold  off on restarting Entresto with transient dizziness.  - Can restart spiro 12.5 mg daily. BMET/BNP today. Repeat BMET 7-10 days.  - Cannot tolerate SGLT2i. - Not on beta blocker due to bradycardia.  - With significant reduction in her EF, her LBBB is wide and she may benefit from CRT.  3. Hyperlipidemia: She remains on Zetia, Crestor and Leqvio.  - Followed in lipid clinic. LDL 25 (9/24) 4. Hypertension: BP mildly elevated today, but generally well-controlled on current regimen. She has had orthostatic symptoms and has fallen in the past.  - Now off amlodipine. BP stable 5. Cerebrovascular disease: She has history of CVA and had left carotid stent in 2/15. Followed by [facility].  Last MRA head showed severe stenosis of  supraclinoid RICA and severe basilar artery stenosis.  - Neurology would like her to continue Plavix for this despite concomitant apixaban use.  6. OHS/OSA: Cannot tolerate CPAP.  Uses oxygen at night.  7. Atrial fibrillation: Paroxysmal. 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 No recent palpitations, NSR today.  - Continue apixaban 5 mg bid. CBC today. 8. Obesity: Body mass index is 40.57 kg/m. - She is now on Ozempic. 9. Mitral regurgitation: Moderate on 11/22 echo.  - Mild to moderate on 7/24 echo.  10. Bradycardia: Now off Toprol XL.   11. Aortic stenosis: Mild on 11/22 echo.  - Arranging for repeat echo as above.  12. Mitral annular mass: This is a well-circumscribed calcified posterior mitral annulus mass noted on 5/22 and 11/22 echoes.  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 12. Weakness - working with home PT  Follow up in 3 months with Dr. Shirlee Latch  Signed, Alen Bleacher, NP  11/05/2022  [facility] [address] [facility] [place] [address] [phone] (office) [phone] (fax)

## 2022-11-05 ENCOUNTER — Ambulatory Visit (HOSPITAL_COMMUNITY)
Admission: RE | Admit: 2022-11-05 | Discharge: 2022-11-05 | Disposition: A | Discharge status: Home / Self Care | Payer: Medicare Other | Source: Ambulatory Visit | Attending: Internal Medicine | Admitting: Internal Medicine

## 2022-11-05 ENCOUNTER — Encounter (HOSPITAL_COMMUNITY): Payer: Self-pay

## 2022-11-05 VITALS — BP 122/60 | HR 53 | Wt 221.8 lb

## 2022-11-05 DIAGNOSIS — I25119 Atherosclerotic heart disease of native coronary artery with unspecified angina pectoris: Secondary | ICD-10-CM | POA: Diagnosis not present

## 2022-11-05 DIAGNOSIS — I480 Paroxysmal atrial fibrillation: Secondary | ICD-10-CM | POA: Diagnosis not present

## 2022-11-05 DIAGNOSIS — I447 Left bundle-branch block, unspecified: Secondary | ICD-10-CM | POA: Insufficient documentation

## 2022-11-05 DIAGNOSIS — I080 Rheumatic disorders of both mitral and aortic valves: Secondary | ICD-10-CM | POA: Diagnosis not present

## 2022-11-05 DIAGNOSIS — G4733 Obstructive sleep apnea (adult) (pediatric): Secondary | ICD-10-CM | POA: Insufficient documentation

## 2022-11-05 DIAGNOSIS — Z79899 Other long term (current) drug therapy: Secondary | ICD-10-CM | POA: Diagnosis not present

## 2022-11-05 DIAGNOSIS — Z6841 Body Mass Index (BMI) 40.0 and over, adult: Secondary | ICD-10-CM | POA: Diagnosis not present

## 2022-11-05 DIAGNOSIS — I498 Other specified cardiac arrhythmias: Secondary | ICD-10-CM | POA: Diagnosis not present

## 2022-11-05 DIAGNOSIS — Z7901 Long term (current) use of anticoagulants: Secondary | ICD-10-CM | POA: Insufficient documentation

## 2022-11-05 DIAGNOSIS — Z8249 Family history of ischemic heart disease and other diseases of the circulatory system: Secondary | ICD-10-CM | POA: Insufficient documentation

## 2022-11-05 DIAGNOSIS — E785 Hyperlipidemia, unspecified: Secondary | ICD-10-CM | POA: Insufficient documentation

## 2022-11-05 DIAGNOSIS — Z955 Presence of coronary angioplasty implant and graft: Secondary | ICD-10-CM | POA: Insufficient documentation

## 2022-11-05 DIAGNOSIS — I130 Hypertensive heart and chronic kidney disease with heart failure and stage 1 through stage 4 chronic kidney disease, or unspecified chronic kidney disease: Secondary | ICD-10-CM | POA: Insufficient documentation

## 2022-11-05 DIAGNOSIS — Z8673 Personal history of transient ischemic attack (TIA), and cerebral infarction without residual deficits: Secondary | ICD-10-CM | POA: Insufficient documentation

## 2022-11-05 DIAGNOSIS — Z7985 Long-term (current) use of injectable non-insulin antidiabetic drugs: Secondary | ICD-10-CM | POA: Diagnosis not present

## 2022-11-05 DIAGNOSIS — R531 Weakness: Secondary | ICD-10-CM | POA: Diagnosis not present

## 2022-11-05 DIAGNOSIS — Z823 Family history of stroke: Secondary | ICD-10-CM | POA: Insufficient documentation

## 2022-11-05 DIAGNOSIS — I2510 Atherosclerotic heart disease of native coronary artery without angina pectoris: Secondary | ICD-10-CM

## 2022-11-05 DIAGNOSIS — I5042 Chronic combined systolic (congestive) and diastolic (congestive) heart failure: Secondary | ICD-10-CM | POA: Diagnosis present

## 2022-11-05 DIAGNOSIS — Z9981 Dependence on supplemental oxygen: Secondary | ICD-10-CM | POA: Insufficient documentation

## 2022-11-05 DIAGNOSIS — E669 Obesity, unspecified: Secondary | ICD-10-CM | POA: Insufficient documentation

## 2022-11-05 DIAGNOSIS — I25708 Atherosclerosis of coronary artery bypass graft(s), unspecified, with other forms of angina pectoris: Secondary | ICD-10-CM

## 2022-11-05 LAB — BASIC METABOLIC PANEL
Anion gap: 10 (ref 5–15)
BUN: 17 mg/dL (ref 8–23)
CO2: 25 mmol/L (ref 22–32)
Calcium: 9.5 mg/dL (ref 8.9–10.3)
Chloride: 104 mmol/L (ref 98–111)
Creatinine, Ser: 1.13 mg/dL — ABNORMAL HIGH (ref 0.44–1.00)
GFR, Estimated: 51 mL/min — ABNORMAL LOW (ref >60)
Glucose, Bld: 132 mg/dL — ABNORMAL HIGH (ref 70–99)
Potassium: 4.3 mmol/L (ref 3.5–5.1)
Sodium: 139 mmol/L (ref 135–145)

## 2022-11-05 LAB — CBC
HCT: 36.4 % (ref 36.0–46.0)
Hemoglobin: 12.4 g/dL (ref 12.0–15.0)
MCH: 32.3 pg (ref 26.0–34.0)
MCHC: 34.1 g/dL (ref 30.0–36.0)
MCV: 94.8 fL (ref 80.0–100.0)
Platelets: 209 10*3/uL (ref 150–400)
RBC: 3.84 MIL/uL — ABNORMAL LOW (ref 3.87–5.11)
RDW: 13.7 % (ref 11.5–15.5)
WBC: 9 10*3/uL (ref 4.0–10.5)
nRBC: 0 % (ref 0.0–0.2)

## 2022-11-05 LAB — BRAIN NATRIURETIC PEPTIDE: B Natriuretic Peptide: 197.4 pg/mL — ABNORMAL HIGH (ref 0.0–100.0)

## 2022-11-05 MED ORDER — SPIRONOLACTONE 25 MG PO TABS: 12.5000 mg | ORAL_TABLET | Freq: Every day | ORAL | 3 refills | Status: DC

## 2022-11-05 NOTE — Patient Instructions (Signed)
Medication Changes:  RESTART: SPIRONOLACTONE 12.5MG  DAILY   Lab Work:  Labs done today, your results will be available in MyChart, we will contact you for abnormal readings.  THEN RETURN FOR LABS IN 10 DAYS AS SCHEDULED   Referrals:  YOU HAVE BEEN REFERRED TO DR. HARDING THEY WILL REACH OUT TO YOU OR CALL TO ARRANGE THIS. PLEASE CALL us WITH ANY CONCERNS   Follow-Up in: 3 months PLEASE CALL OUR OFFICE AROUND NOVEMBER TO GET SCHEDULED FOR YOUR APPOINTMENT. PHONE NUMBER IS [phone] OPTION 2    At [facility], you and your health needs are our priority. We have a designated team specialized in the treatment of Heart Failure. This Care Team includes your primary Heart Failure Specialized Cardiologist (physician), Advanced Practice Providers (APPs- Physician Assistants and Nurse Practitioners), and Pharmacist who all work together to provide you with the care you need, when you need it.   You may see any of the following providers on your designated Care Team at your next follow up:  Dr. Arvilla Meres Dr. Marca Ancona Dr. Dorthula Nettles Dr. Theresia Bough Tonye Becket, NP Robbie Lis, [place] [facility] [place], [place] Brynda Peon, NP [place] Lee, NP Karle Plumber, PharmD   Please be sure to bring in all your medications bottles to every appointment.   Need to Contact us:  If you have any questions or concerns before your next appointment please send [place] a message through [place] or call our office at [phone].    TO LEAVE A MESSAGE FOR THE NURSE SELECT OPTION 2, PLEASE LEAVE A MESSAGE INCLUDING: YOUR NAME DATE OF BIRTH CALL BACK NUMBER REASON FOR CALL**this is important as we prioritize the call backs  YOU WILL RECEIVE A CALL BACK THE SAME DAY AS LONG AS YOU CALL BEFORE 4:00 PM

## 2022-11-10 ENCOUNTER — Ambulatory Visit: Payer: Medicare Other | Attending: Cardiology | Admitting: Cardiology

## 2022-11-10 ENCOUNTER — Encounter: Payer: Self-pay | Admitting: Cardiology

## 2022-11-10 VITALS — BP 130/88 | HR 64 | Wt 223.2 lb

## 2022-11-10 DIAGNOSIS — I5042 Chronic combined systolic (congestive) and diastolic (congestive) heart failure: Secondary | ICD-10-CM | POA: Insufficient documentation

## 2022-11-10 DIAGNOSIS — I2510 Atherosclerotic heart disease of native coronary artery without angina pectoris: Secondary | ICD-10-CM | POA: Diagnosis not present

## 2022-11-10 DIAGNOSIS — I25708 Atherosclerosis of coronary artery bypass graft(s), unspecified, with other forms of angina pectoris: Secondary | ICD-10-CM | POA: Insufficient documentation

## 2022-11-10 DIAGNOSIS — G4733 Obstructive sleep apnea (adult) (pediatric): Secondary | ICD-10-CM | POA: Diagnosis present

## 2022-11-10 DIAGNOSIS — Z8673 Personal history of transient ischemic attack (TIA), and cerebral infarction without residual deficits: Secondary | ICD-10-CM | POA: Diagnosis not present

## 2022-11-10 NOTE — Progress Notes (Signed)
Cardiology Office Note:  .   Date:  11/10/2022  ID:  [name] [name], DOB 1945/06/11, [name] [id number] PCP: Mosetta Putt, MD  [facility] Providers Cardiologist:  Lance Muss, MD Advanced Heart Failure:  Marca Ancona, MD     History of Present Illness: .   [name] [name] is a 77 y.o. female Discussed with the use of AI scribe software   History of Present Illness   Extremely complex medical history with chronic systolic heart failure, coronary artery disease post CABG and multiple PCI's, atrial fibrillation, and sleep apnea, presents with ongoing chest discomfort and dyspnea. She reports difficulty catching her breath, particularly when talking or moving. The patient has a history of multiple cardiac interventions, including bypass surgery and stent placements. She is currently on Eliquis, Plavix, Zetia, Crestor, and Leqvio. She was previously on Toprol and Imdur, but these were discontinued due to bradycardia and headaches, respectively.  Despite the ongoing symptoms, the patient's condition appears relatively stable. She expresses concern about a 95% blockage and what might happen if it becomes 100% blocked.            Studies Reviewed: .        Cardiac Studies & Procedures   CARDIAC CATHETERIZATION  CARDIAC CATHETERIZATION 10/22/2022  Narrative   Ost LAD to Prox LAD lesion is 90% stenosed.   Prox LAD lesion is 100% stenosed.   Ost Cx to Prox Cx lesion is 70% stenosed.   Mid RCA to Dist RCA lesion is 40% stenosed.   Prox RCA to Mid RCA lesion is 70% stenosed.   Origin lesion is 100% stenosed.   1st Mrg lesion is 100% stenosed.   3rd Mrg lesion is 100% stenosed.   Ost RCA to Prox RCA lesion is 50% stenosed.   Prox Graft lesion is 30% stenosed.   Origin to Prox Graft lesion is 30% stenosed.   Prox RCA lesion is 99% stenosed.  1. Mildly elevated PCWP and LVEDP 2. Mixed pulmonary venous/pulmonary arterial hypertension, moderate.  PVR 3 WU. 3. Preserved  cardiac output. 4. Known occluded SVG-PDA.  Native RCA has stents extending from the ostium to the distal vessel.  99% in-stent restenosis in the proximal RCA, at least 2 layers of stent are present.  Discussed with Dr. Herbie Baltimore, will plan on cutting balloon angioplasty to this lesion on Monday.  However, long-term, this vessel will remain very tenuous.  Will admit over weekend due to unstable angina.  Findings Coronary Findings Diagnostic  Dominance: Right  Left Anterior Descending Ost LAD to Prox LAD lesion is 90% stenosed. Prox LAD lesion is 100% stenosed.  Left Circumflex Ost Cx to Prox Cx lesion is 70% stenosed.  First Obtuse Marginal Branch 1st Mrg lesion is 100% stenosed.  Third Obtuse Marginal Branch 3rd Mrg lesion is 100% stenosed.  Right Coronary Artery Ost RCA to Prox RCA lesion is 50% stenosed. Prox RCA lesion is 99% stenosed. The lesion was previously treated . Prox RCA to Mid RCA lesion is 70% stenosed. The lesion was previously treated . Mid RCA to Dist RCA lesion is 40% stenosed. The lesion was previously treated .  Graft To Dist RCA Origin lesion is 100% stenosed.  Sequential Graft To 1st Mrg, 3rd Mrg  LIMA Graft To Dist LAD Prox Graft lesion is 30% stenosed.  Graft To 1st Diag Origin to Prox Graft lesion is 30% stenosed.  Intervention  No interventions have been documented.   CARDIAC CATHETERIZATION  CARDIAC CATHETERIZATION 04/20/2021  Narrative Images  Cardiology Office Note:  .   Date:  11/10/2022  ID:  [name] [name], DOB 1945/06/11, [name] [id number] PCP: Mosetta Putt, MD  [facility] Providers Cardiologist:  Lance Muss, MD Advanced Heart Failure:  Marca Ancona, MD     History of Present Illness: .   [name] [name] is a 77 y.o. female Discussed with the use of AI scribe software   History of Present Illness   Extremely complex medical history with chronic systolic heart failure, coronary artery disease post CABG and multiple PCI's, atrial fibrillation, and sleep apnea, presents with ongoing chest discomfort and dyspnea. She reports difficulty catching her breath, particularly when talking or moving. The patient has a history of multiple cardiac interventions, including bypass surgery and stent placements. She is currently on Eliquis, Plavix, Zetia, Crestor, and Leqvio. She was previously on Toprol and Imdur, but these were discontinued due to bradycardia and headaches, respectively.  Despite the ongoing symptoms, the patient's condition appears relatively stable. She expresses concern about a 95% blockage and what might happen if it becomes 100% blocked.            Studies Reviewed: .        Cardiac Studies & Procedures   CARDIAC CATHETERIZATION  CARDIAC CATHETERIZATION 10/22/2022  Narrative   Ost LAD to Prox LAD lesion is 90% stenosed.   Prox LAD lesion is 100% stenosed.   Ost Cx to Prox Cx lesion is 70% stenosed.   Mid RCA to Dist RCA lesion is 40% stenosed.   Prox RCA to Mid RCA lesion is 70% stenosed.   Origin lesion is 100% stenosed.   1st Mrg lesion is 100% stenosed.   3rd Mrg lesion is 100% stenosed.   Ost RCA to Prox RCA lesion is 50% stenosed.   Prox Graft lesion is 30% stenosed.   Origin to Prox Graft lesion is 30% stenosed.   Prox RCA lesion is 99% stenosed.  1. Mildly elevated PCWP and LVEDP 2. Mixed pulmonary venous/pulmonary arterial hypertension, moderate.  PVR 3 WU. 3. Preserved  cardiac output. 4. Known occluded SVG-PDA.  Native RCA has stents extending from the ostium to the distal vessel.  99% in-stent restenosis in the proximal RCA, at least 2 layers of stent are present.  Discussed with Dr. Herbie Baltimore, will plan on cutting balloon angioplasty to this lesion on Monday.  However, long-term, this vessel will remain very tenuous.  Will admit over weekend due to unstable angina.  Findings Coronary Findings Diagnostic  Dominance: Right  Left Anterior Descending Ost LAD to Prox LAD lesion is 90% stenosed. Prox LAD lesion is 100% stenosed.  Left Circumflex Ost Cx to Prox Cx lesion is 70% stenosed.  First Obtuse Marginal Branch 1st Mrg lesion is 100% stenosed.  Third Obtuse Marginal Branch 3rd Mrg lesion is 100% stenosed.  Right Coronary Artery Ost RCA to Prox RCA lesion is 50% stenosed. Prox RCA lesion is 99% stenosed. The lesion was previously treated . Prox RCA to Mid RCA lesion is 70% stenosed. The lesion was previously treated . Mid RCA to Dist RCA lesion is 40% stenosed. The lesion was previously treated .  Graft To Dist RCA Origin lesion is 100% stenosed.  Sequential Graft To 1st Mrg, 3rd Mrg  LIMA Graft To Dist LAD Prox Graft lesion is 30% stenosed.  Graft To 1st Diag Origin to Prox Graft lesion is 30% stenosed.  Intervention  No interventions have been documented.   CARDIAC CATHETERIZATION  CARDIAC CATHETERIZATION 04/20/2021  Narrative Images  flex Cutting Balloon angioplasty of high-grade "in-stent restenosis within the proximal RCA stented segment which had overlapping stents.  Dr. Shirlee Latch did the diagnostic case as well as the note for that.  The sheath will be pulled once ACT falls below 170 and pressure held.  The patient be gently hydrated.  She did receive aspirin this morning and will be on "triple therapy" for 1 month with Eliquis, clopidogrel and aspirin after which the aspirin can be discontinued.  She left the lab in stable condition.  Nanetta Batty. MD, [facility] 04/20/2021 12:23 PM  Findings Coronary Findings Diagnostic  Dominance: Right  Left Anterior Descending Ost LAD to Prox LAD lesion is 90% stenosed. Prox LAD lesion is 100% stenosed.  Left Circumflex Ost Cx to Prox Cx lesion is 70% stenosed.  First Obtuse Marginal Branch 1st Mrg lesion is 100% stenosed.  Third Obtuse Marginal Branch 3rd Mrg lesion is 100% stenosed.  Right Coronary Artery Prox RCA lesion is 90% stenosed. The lesion was previously treated . Prox RCA to Mid RCA lesion is 70% stenosed. Mid RCA to Dist RCA lesion is 40%  stenosed. The lesion was previously treated .  Graft To Dist RCA Origin lesion is 100% stenosed.  Sequential Graft To 1st Mrg, 3rd Mrg  LIMA Graft To Dist LAD  Graft To 1st Diag  Intervention  Prox RCA lesion Angioplasty Scoring balloon angioplasty was performed. Post-Intervention Lesion Assessment The intervention was successful. Pre-interventional TIMI flow is 2. Post-intervention TIMI flow is 3. No complications occurred at this lesion. There is a 0% residual stenosis post intervention.   STRESS TESTS  MYOCARDIAL PERFUSION IMAGING 02/06/2019  Narrative  The left ventricular ejection fraction is normal (55-65%).  Nuclear stress EF: 57%.  No T wave inversion was noted during stress.  There was no ST segment deviation noted during stress.  Defect 1: There is a medium defect of moderate severity.  This is a low risk study.  Medium size, moderate severity fixed inferior perfusion defect, likely artifact. No reversible ischemia. LVEF 57% with normal wall motion. This is a low risk study.   ECHOCARDIOGRAM  ECHOCARDIOGRAM COMPLETE 09/09/2022  Narrative ECHOCARDIOGRAM REPORT    Patient Name:   [name] [name] Date of Exam: 09/09/2022 Medical Rec #:  [id number]      Height:       61.0 in Accession #:    [id number]     Weight:       228.6 lb Date of Birth:  [birth year]      BSA:          1.999 m Patient Age:    76 years       BP:           120/64 mmHg Patient Gender: F              HR:           60 bpm. Exam Location:  Outpatient  Procedure: 2D Echo, Cardiac Doppler and Color Doppler  Indications:    Congestive Heart Failure I50.9  History:        Patient has prior history of Echocardiogram examinations, most recent 12/29/2020. CHF, Previous Myocardial Infarction and CAD, Prior CABG, Carotid Disease, TIA, CKD 3 and Stroke, Mitral Valve Disease, Arrythmias:Atrial Fibrillation; Risk Factors:Hypertension, Sleep Apnea, Diabetes, Dyslipidemia  and Non-Smoker.  Sonographer:    Dondra Prader RVT RCS Referring Phys: [id number] DALTON S MCLEAN  IMPRESSIONS   1. Left ventricular ejection fraction, by estimation, is 30 to 35%. The left  flex Cutting Balloon angioplasty of high-grade "in-stent restenosis within the proximal RCA stented segment which had overlapping stents.  Dr. Shirlee Latch did the diagnostic case as well as the note for that.  The sheath will be pulled once ACT falls below 170 and pressure held.  The patient be gently hydrated.  She did receive aspirin this morning and will be on "triple therapy" for 1 month with Eliquis, clopidogrel and aspirin after which the aspirin can be discontinued.  She left the lab in stable condition.  Nanetta Batty. MD, [facility] 04/20/2021 12:23 PM  Findings Coronary Findings Diagnostic  Dominance: Right  Left Anterior Descending Ost LAD to Prox LAD lesion is 90% stenosed. Prox LAD lesion is 100% stenosed.  Left Circumflex Ost Cx to Prox Cx lesion is 70% stenosed.  First Obtuse Marginal Branch 1st Mrg lesion is 100% stenosed.  Third Obtuse Marginal Branch 3rd Mrg lesion is 100% stenosed.  Right Coronary Artery Prox RCA lesion is 90% stenosed. The lesion was previously treated . Prox RCA to Mid RCA lesion is 70% stenosed. Mid RCA to Dist RCA lesion is 40%  stenosed. The lesion was previously treated .  Graft To Dist RCA Origin lesion is 100% stenosed.  Sequential Graft To 1st Mrg, 3rd Mrg  LIMA Graft To Dist LAD  Graft To 1st Diag  Intervention  Prox RCA lesion Angioplasty Scoring balloon angioplasty was performed. Post-Intervention Lesion Assessment The intervention was successful. Pre-interventional TIMI flow is 2. Post-intervention TIMI flow is 3. No complications occurred at this lesion. There is a 0% residual stenosis post intervention.   STRESS TESTS  MYOCARDIAL PERFUSION IMAGING 02/06/2019  Narrative  The left ventricular ejection fraction is normal (55-65%).  Nuclear stress EF: 57%.  No T wave inversion was noted during stress.  There was no ST segment deviation noted during stress.  Defect 1: There is a medium defect of moderate severity.  This is a low risk study.  Medium size, moderate severity fixed inferior perfusion defect, likely artifact. No reversible ischemia. LVEF 57% with normal wall motion. This is a low risk study.   ECHOCARDIOGRAM  ECHOCARDIOGRAM COMPLETE 09/09/2022  Narrative ECHOCARDIOGRAM REPORT    Patient Name:   [name] [name] Date of Exam: 09/09/2022 Medical Rec #:  [id number]      Height:       61.0 in Accession #:    [id number]     Weight:       228.6 lb Date of Birth:  [birth year]      BSA:          1.999 m Patient Age:    76 years       BP:           120/64 mmHg Patient Gender: F              HR:           60 bpm. Exam Location:  Outpatient  Procedure: 2D Echo, Cardiac Doppler and Color Doppler  Indications:    Congestive Heart Failure I50.9  History:        Patient has prior history of Echocardiogram examinations, most recent 12/29/2020. CHF, Previous Myocardial Infarction and CAD, Prior CABG, Carotid Disease, TIA, CKD 3 and Stroke, Mitral Valve Disease, Arrythmias:Atrial Fibrillation; Risk Factors:Hypertension, Sleep Apnea, Diabetes, Dyslipidemia  and Non-Smoker.  Sonographer:    Dondra Prader RVT RCS Referring Phys: [id number] DALTON S MCLEAN  IMPRESSIONS   1. Left ventricular ejection fraction, by estimation, is 30 to 35%. The left  Cardiology Office Note:  .   Date:  11/10/2022  ID:  [name] [name], DOB 1945/06/11, [name] [id number] PCP: Mosetta Putt, MD  [facility] Providers Cardiologist:  Lance Muss, MD Advanced Heart Failure:  Marca Ancona, MD     History of Present Illness: .   [name] [name] is a 77 y.o. female Discussed with the use of AI scribe software   History of Present Illness   Extremely complex medical history with chronic systolic heart failure, coronary artery disease post CABG and multiple PCI's, atrial fibrillation, and sleep apnea, presents with ongoing chest discomfort and dyspnea. She reports difficulty catching her breath, particularly when talking or moving. The patient has a history of multiple cardiac interventions, including bypass surgery and stent placements. She is currently on Eliquis, Plavix, Zetia, Crestor, and Leqvio. She was previously on Toprol and Imdur, but these were discontinued due to bradycardia and headaches, respectively.  Despite the ongoing symptoms, the patient's condition appears relatively stable. She expresses concern about a 95% blockage and what might happen if it becomes 100% blocked.            Studies Reviewed: .        Cardiac Studies & Procedures   CARDIAC CATHETERIZATION  CARDIAC CATHETERIZATION 10/22/2022  Narrative   Ost LAD to Prox LAD lesion is 90% stenosed.   Prox LAD lesion is 100% stenosed.   Ost Cx to Prox Cx lesion is 70% stenosed.   Mid RCA to Dist RCA lesion is 40% stenosed.   Prox RCA to Mid RCA lesion is 70% stenosed.   Origin lesion is 100% stenosed.   1st Mrg lesion is 100% stenosed.   3rd Mrg lesion is 100% stenosed.   Ost RCA to Prox RCA lesion is 50% stenosed.   Prox Graft lesion is 30% stenosed.   Origin to Prox Graft lesion is 30% stenosed.   Prox RCA lesion is 99% stenosed.  1. Mildly elevated PCWP and LVEDP 2. Mixed pulmonary venous/pulmonary arterial hypertension, moderate.  PVR 3 WU. 3. Preserved  cardiac output. 4. Known occluded SVG-PDA.  Native RCA has stents extending from the ostium to the distal vessel.  99% in-stent restenosis in the proximal RCA, at least 2 layers of stent are present.  Discussed with Dr. Herbie Baltimore, will plan on cutting balloon angioplasty to this lesion on Monday.  However, long-term, this vessel will remain very tenuous.  Will admit over weekend due to unstable angina.  Findings Coronary Findings Diagnostic  Dominance: Right  Left Anterior Descending Ost LAD to Prox LAD lesion is 90% stenosed. Prox LAD lesion is 100% stenosed.  Left Circumflex Ost Cx to Prox Cx lesion is 70% stenosed.  First Obtuse Marginal Branch 1st Mrg lesion is 100% stenosed.  Third Obtuse Marginal Branch 3rd Mrg lesion is 100% stenosed.  Right Coronary Artery Ost RCA to Prox RCA lesion is 50% stenosed. Prox RCA lesion is 99% stenosed. The lesion was previously treated . Prox RCA to Mid RCA lesion is 70% stenosed. The lesion was previously treated . Mid RCA to Dist RCA lesion is 40% stenosed. The lesion was previously treated .  Graft To Dist RCA Origin lesion is 100% stenosed.  Sequential Graft To 1st Mrg, 3rd Mrg  LIMA Graft To Dist LAD Prox Graft lesion is 30% stenosed.  Graft To 1st Diag Origin to Prox Graft lesion is 30% stenosed.  Intervention  No interventions have been documented.   CARDIAC CATHETERIZATION  CARDIAC CATHETERIZATION 04/20/2021  Narrative Images  LA Vol (A2C):   105.0 ml 52.52 ml/m  RA Volume:   39.40 ml  19.71 ml/m LA Vol (A4C):   104.0 ml 52.02 ml/m LA Biplane Vol: 107.0 ml 53.52 ml/m AORTIC VALVE                    PULMONIC VALVE AV Area (Vmax):    1.85 cm     PV Vmax:       0.96 m/s AV Area (Vmean):   1.86 cm     PV Peak grad:  3.7 mmHg AV Area (VTI):     1.80 cm AV Vmax:           142.00 cm/s AV Vmean:           94.300 cm/s AV VTI:            0.321 m AV Peak Grad:      8.1 mmHg AV Mean Grad:      4.0 mmHg LVOT Vmax:         131.00 cm/s LVOT Vmean:        87.400 cm/s LVOT VTI:          0.288 m LVOT/AV VTI ratio: 0.90  AORTA Ao Root diam: 3.00 cm Ao Asc diam:  4.10 cm  MITRAL VALVE MV Area (PHT): 3.31 cm     SHUNTS MV Decel Time: 229 msec     Systemic VTI:  0.29 m MV E velocity: 115.00 cm/s  Systemic Diam: 1.60 cm MV A velocity: 123.00 cm/s MV E/A ratio:  0.93  Olga Millers MD Electronically signed by Olga Millers MD Signature Date/Time: 09/09/2022/10:51:08 AM    Final   TEE  ECHO TEE 01/18/2019  Narrative TRANSESOPHOGEAL ECHO REPORT    Patient Name:   [name] [name] Date of Exam: 01/18/2019 Medical Rec #:  [id number]      Height:       62.0 in Accession #:    [id number]     Weight:       231.0 lb Date of Birth:  11/15/1945      BSA:          2.03 m Patient Age:    73 years       BP:           150/50 mmHg Patient Gender: F              HR:           68 bpm. Exam Location:  Inpatient   Procedure: Transesophageal Echo  Indications:    Mitral Valve  History:        Patient has prior history of Echocardiogram examinations, most recent 12/24/2018.  Sonographer:    Jeryl Columbia Referring Phys: 31 DALTON S MCLEAN    PROCEDURE: The transesophogeal probe was passed through the esophogus of the patient. The patient developed no complications during the procedure.  IMPRESSIONS   1. Normal LV size with mild LV hypertrophy. EF 50-55%, septal-lateral dyssynchrony consistent with LBBB. 2. Global right ventricle has mildly reduced systolic function.The right ventricular size is moderately enlarged. No increase in right ventricular wall thickness. 3. Right atrial size was mildly dilated. 4. Left atrial size was mildly dilated. No LA appendage thrombus. 5. Peak RV-RA gradient 51 mmHg. 6. There was moderate mitral regurgitation, PISA ERO 0.18 cm^2. There was no pulmonary  vein doppler flow reversal. There was restriction of the posterior mitral leaflet. 7. The aortic valve is tricuspid. Aortic valve regurgitation  LA Vol (A2C):   105.0 ml 52.52 ml/m  RA Volume:   39.40 ml  19.71 ml/m LA Vol (A4C):   104.0 ml 52.02 ml/m LA Biplane Vol: 107.0 ml 53.52 ml/m AORTIC VALVE                    PULMONIC VALVE AV Area (Vmax):    1.85 cm     PV Vmax:       0.96 m/s AV Area (Vmean):   1.86 cm     PV Peak grad:  3.7 mmHg AV Area (VTI):     1.80 cm AV Vmax:           142.00 cm/s AV Vmean:           94.300 cm/s AV VTI:            0.321 m AV Peak Grad:      8.1 mmHg AV Mean Grad:      4.0 mmHg LVOT Vmax:         131.00 cm/s LVOT Vmean:        87.400 cm/s LVOT VTI:          0.288 m LVOT/AV VTI ratio: 0.90  AORTA Ao Root diam: 3.00 cm Ao Asc diam:  4.10 cm  MITRAL VALVE MV Area (PHT): 3.31 cm     SHUNTS MV Decel Time: 229 msec     Systemic VTI:  0.29 m MV E velocity: 115.00 cm/s  Systemic Diam: 1.60 cm MV A velocity: 123.00 cm/s MV E/A ratio:  0.93  Olga Millers MD Electronically signed by Olga Millers MD Signature Date/Time: 09/09/2022/10:51:08 AM    Final   TEE  ECHO TEE 01/18/2019  Narrative TRANSESOPHOGEAL ECHO REPORT    Patient Name:   [name] [name] Date of Exam: 01/18/2019 Medical Rec #:  [id number]      Height:       62.0 in Accession #:    [id number]     Weight:       231.0 lb Date of Birth:  11/15/1945      BSA:          2.03 m Patient Age:    73 years       BP:           150/50 mmHg Patient Gender: F              HR:           68 bpm. Exam Location:  Inpatient   Procedure: Transesophageal Echo  Indications:    Mitral Valve  History:        Patient has prior history of Echocardiogram examinations, most recent 12/24/2018.  Sonographer:    Jeryl Columbia Referring Phys: 31 DALTON S MCLEAN    PROCEDURE: The transesophogeal probe was passed through the esophogus of the patient. The patient developed no complications during the procedure.  IMPRESSIONS   1. Normal LV size with mild LV hypertrophy. EF 50-55%, septal-lateral dyssynchrony consistent with LBBB. 2. Global right ventricle has mildly reduced systolic function.The right ventricular size is moderately enlarged. No increase in right ventricular wall thickness. 3. Right atrial size was mildly dilated. 4. Left atrial size was mildly dilated. No LA appendage thrombus. 5. Peak RV-RA gradient 51 mmHg. 6. There was moderate mitral regurgitation, PISA ERO 0.18 cm^2. There was no pulmonary  vein doppler flow reversal. There was restriction of the posterior mitral leaflet. 7. The aortic valve is tricuspid. Aortic valve regurgitation  LA Vol (A2C):   105.0 ml 52.52 ml/m  RA Volume:   39.40 ml  19.71 ml/m LA Vol (A4C):   104.0 ml 52.02 ml/m LA Biplane Vol: 107.0 ml 53.52 ml/m AORTIC VALVE                    PULMONIC VALVE AV Area (Vmax):    1.85 cm     PV Vmax:       0.96 m/s AV Area (Vmean):   1.86 cm     PV Peak grad:  3.7 mmHg AV Area (VTI):     1.80 cm AV Vmax:           142.00 cm/s AV Vmean:           94.300 cm/s AV VTI:            0.321 m AV Peak Grad:      8.1 mmHg AV Mean Grad:      4.0 mmHg LVOT Vmax:         131.00 cm/s LVOT Vmean:        87.400 cm/s LVOT VTI:          0.288 m LVOT/AV VTI ratio: 0.90  AORTA Ao Root diam: 3.00 cm Ao Asc diam:  4.10 cm  MITRAL VALVE MV Area (PHT): 3.31 cm     SHUNTS MV Decel Time: 229 msec     Systemic VTI:  0.29 m MV E velocity: 115.00 cm/s  Systemic Diam: 1.60 cm MV A velocity: 123.00 cm/s MV E/A ratio:  0.93  Olga Millers MD Electronically signed by Olga Millers MD Signature Date/Time: 09/09/2022/10:51:08 AM    Final   TEE  ECHO TEE 01/18/2019  Narrative TRANSESOPHOGEAL ECHO REPORT    Patient Name:   [name] [name] Date of Exam: 01/18/2019 Medical Rec #:  [id number]      Height:       62.0 in Accession #:    [id number]     Weight:       231.0 lb Date of Birth:  11/15/1945      BSA:          2.03 m Patient Age:    73 years       BP:           150/50 mmHg Patient Gender: F              HR:           68 bpm. Exam Location:  Inpatient   Procedure: Transesophageal Echo  Indications:    Mitral Valve  History:        Patient has prior history of Echocardiogram examinations, most recent 12/24/2018.  Sonographer:    Jeryl Columbia Referring Phys: 31 DALTON S MCLEAN    PROCEDURE: The transesophogeal probe was passed through the esophogus of the patient. The patient developed no complications during the procedure.  IMPRESSIONS   1. Normal LV size with mild LV hypertrophy. EF 50-55%, septal-lateral dyssynchrony consistent with LBBB. 2. Global right ventricle has mildly reduced systolic function.The right ventricular size is moderately enlarged. No increase in right ventricular wall thickness. 3. Right atrial size was mildly dilated. 4. Left atrial size was mildly dilated. No LA appendage thrombus. 5. Peak RV-RA gradient 51 mmHg. 6. There was moderate mitral regurgitation, PISA ERO 0.18 cm^2. There was no pulmonary  vein doppler flow reversal. There was restriction of the posterior mitral leaflet. 7. The aortic valve is tricuspid. Aortic valve regurgitation

## 2022-11-10 NOTE — Patient Instructions (Signed)
Medication Instructions:   *If you need a refill on your cardiac medications before your next appointment, please call your pharmacy*   Lab Work:  If you have labs (blood work) drawn today and your tests are completely normal, you will receive your results only by: MyChart Message (if you have MyChart) OR A paper copy in the mail If you have any lab test that is abnormal or we need to change your treatment, we will call you to review the results.   Testing/Procedures:    Follow-Up: At [facility], you and your health needs are our priority.  As part of our continuing mission to provide you with exceptional heart care, we have created designated Provider Care Teams.  These Care Teams include your primary Cardiologist (physician) and Advanced Practice Providers (APPs -  Physician Assistants and Nurse Practitioners) who all work together to provide you with the care you need, when you need it.  We recommend signing up for the patient portal called "MyChart".  Sign up information is provided on this After Visit Summary.  MyChart is used to connect with patients for Virtual Visits (Telemedicine).  Patients are able to view lab/test results, encounter notes, upcoming appointments, etc.  Non-urgent messages can be sent to your provider as well.   To learn more about what you can do with MyChart, go to [email or web address].    Your next appointment:  FOLLOW UP WITH DR Herbie Baltimore AT THE [facility] OFFICE ASAP

## 2022-11-12 ENCOUNTER — Ambulatory Visit (HOSPITAL_COMMUNITY)
Admission: RE | Admit: 2022-11-12 | Discharge: 2022-11-12 | Disposition: A | Discharge status: Home / Self Care | Payer: Medicare Other | Source: Ambulatory Visit | Attending: Cardiology | Admitting: Cardiology

## 2022-11-12 DIAGNOSIS — I5042 Chronic combined systolic (congestive) and diastolic (congestive) heart failure: Secondary | ICD-10-CM | POA: Insufficient documentation

## 2022-11-12 LAB — BASIC METABOLIC PANEL
Anion gap: 9 (ref 5–15)
BUN: 14 mg/dL (ref 8–23)
CO2: 26 mmol/L (ref 22–32)
Calcium: 9.1 mg/dL (ref 8.9–10.3)
Chloride: 105 mmol/L (ref 98–111)
Creatinine, Ser: 1 mg/dL (ref 0.44–1.00)
GFR, Estimated: 59 mL/min — ABNORMAL LOW (ref >60)
Glucose, Bld: 134 mg/dL — ABNORMAL HIGH (ref 70–99)
Potassium: 4.3 mmol/L (ref 3.5–5.1)
Sodium: 140 mmol/L (ref 135–145)

## 2022-11-15 ENCOUNTER — Ambulatory Visit: Payer: Medicare Other | Attending: Cardiology | Admitting: Cardiology

## 2022-11-15 ENCOUNTER — Encounter: Payer: Self-pay | Admitting: Cardiology

## 2022-11-15 VITALS — BP 154/78 | HR 74 | Ht 62.0 in | Wt 226.8 lb

## 2022-11-15 DIAGNOSIS — I25119 Atherosclerotic heart disease of native coronary artery with unspecified angina pectoris: Secondary | ICD-10-CM | POA: Diagnosis present

## 2022-11-15 DIAGNOSIS — I5042 Chronic combined systolic (congestive) and diastolic (congestive) heart failure: Secondary | ICD-10-CM | POA: Diagnosis present

## 2022-11-15 DIAGNOSIS — E785 Hyperlipidemia, unspecified: Secondary | ICD-10-CM | POA: Insufficient documentation

## 2022-11-15 DIAGNOSIS — E1169 Type 2 diabetes mellitus with other specified complication: Secondary | ICD-10-CM | POA: Diagnosis present

## 2022-11-15 DIAGNOSIS — I480 Paroxysmal atrial fibrillation: Secondary | ICD-10-CM | POA: Insufficient documentation

## 2022-11-15 DIAGNOSIS — I209 Angina pectoris, unspecified: Secondary | ICD-10-CM | POA: Diagnosis present

## 2022-11-15 DIAGNOSIS — I10 Essential (primary) hypertension: Secondary | ICD-10-CM | POA: Diagnosis present

## 2022-11-15 MED ORDER — AMLODIPINE BESYLATE 5 MG PO TABS
5.0000 mg | ORAL_TABLET | Freq: Every day | ORAL | Status: DC
Start: 2022-11-15 — End: 2023-01-18

## 2022-11-15 NOTE — Progress Notes (Signed)
Cardiology Office Note: INTERVENTIONAL CARDIOLOGY CONSULTATION.   Date:  11/15/2022  ID:  [name] [name], DOB 22-Oct-1945, [name] [id number] PCP: Mosetta Putt, MD  [facility] Providers Interventional Cardiologist:  Bryan Lemma, MD Advanced Heart Failure:  Marca Ancona, MD     Chief Complaint  Patient presents with   Coronary Artery Disease    Interventional cardiology consult with known CAD-plans for PCI to RCA.    Patient Profile: .     [name] [name] is a morbidly obese 77 y.o. female with a PMH notable for CAD-CABG x 5 (LIMA-LAD, SVG-D1, SeqSVG-OM1-OM 2, SVG-RCA with known occlusion of SVG-RCA status post CTO PCI of RCA in 2015 and 4 overlapping DES), and newly diagnosed cardiomyopathy with reduced EF from 55% to 30-35% on recent echo and new significant ISR of RCA stent who presents here for interventional consultation to discuss RCA PCI at the request of Mosetta Putt, MD.  Please see detailed heart failure clinic note from 11/05/2022 for details.  [name] [name] is followed by Dr. Shirlee Latch in the advanced heart failure clinic.  Recently evaluated for worsening EF and ongoing chest pain with echo and cardiac catheterization.  Initial plans were for staged PCI, but delayed because of AKI and hematoma.     Subjective   She was seen by Dr. Shirlee Latch on October 4 for post-cath follow-up.  He performed right left heart catheterization for evaluation of ongoing chest tightness would last for weeks with worsening EF on echo.  After her catheterization she actually had a short hospitalization because of right groin hematoma post cath with renal insufficiency related to contrast nephropathy.  Following hospitalization, her Entresto and spironolactone held because of hypotension and renal insufficiency.  She was still noted to have persistent 4-10 chest pain with minimal exertion.  Following hospitalization, multiple medications have not held including amlodipine and Entresto.  Not yet  started.  Plan was to reschedule for staged PCI.  Entresto still held because of transient dizziness-mostly orthostatic.Marland Kitchen  Low-dose spironolactone started.  She was then seen by Dr. Anne Fu on October 9 still with concern for chest discomfort and fearful of chronic occlusion of her RCA stents. => Referred for ICD consultation.   INTERVAL HPI Discussed the use of AI scribe software for clinical note transcription with the patient, who gave verbal consent to proceed.  History of Present Illness   The patient, with a history of heart disease and diabetes, presented with worsening angina and shortness of breath. The patient reported difficulty in walking short distances due to breathlessness and pressure pain in the chest. The patient's symptoms have been progressively worsening, impacting daily activities.  The patient has a history of multiple stents placed in the right coronary artery in 2014 and 2015. Despite this, the patient's angina has persisted and recently worsened. The patient also reported a fall at home after the recent hospitalization, resulting in bruising and discomfort.  The patient's blood pressure has been unstable since the recent hospitalization, leading to adjustments in medication. The patient was taken off Aldactone due to low blood pressure in the hospital but has since been put back on a reduced dose.  The patient has a history of diabetes, managed with Ozempic. The patient also reported back issues, specifically degenerative disc disease, which may impact comfort during the upcoming procedure. The patient's kidney function was recently checked and reported to be back to normal.  The patient has expressed a desire for aggressive treatment if complications arise during the procedure but  does not wish for long-term life support measures. The patient's husband is her primary caregiver and will be present during the procedure.       ROS:  Cardiovascular ROS: positive for -  chest pain, dyspnea on exertion, irregular heartbeat, paroxysmal nocturnal dyspnea, and shortness of breath negative for - irregular heartbeat, orthopnea, palpitations, rapid heart rate, or syncope or near syncope, TIA or emesis DES, claudication Review of Systems - Negative except symptoms noted above.  Hematoma seems improving.     Objective   Studies Reviewed: Marland Kitchen   EKG Interpretation Date/Time:  Monday November 15 2022 16:13:31 EDT Ventricular Rate:  74 PR Interval:  158 QRS Duration:  170 QT Interval:  440 QTC Calculation: 488 R Axis:   138  Text Interpretation: Normal sinus rhythm Right axis deviation Left bundle branch block When compared with ECG of 05-Nov-2022 11:11, Questionable change in QRS axis Inverted T waves have replaced nonspecific T wave abnormality in Inferior leads Confirmed by Bryan Lemma ([id number]) on 11/15/2022 5:06:23 PM    ECHO 09/09/2022: LVEF 30 to 35% with global HK.  Mild LV dilation, GR 1 DD.  Elevated LAP.  Severe LA dilation.  Normal RV size.  Severe MAC with mild to moderate MR.  AOV sclerosis but no stenosis.  Normal RAP. Notably reduced EF compared to 12/2020-EF 60 to 65% with moderate LVH. R&LHC 10/22/2022: Mildly elevated patient BP and LVEDP with mixed pulmonary venous and pulmonary arterial hypertension.  PVR 3 [place]. Known CTO SVG-rPDA, with extensive stents in the RCA with 50% ostial ISR, 99% proximal by 70% and then 40% mid to distal RCA ISR. Proximal LAD 90% followed by 100% at D1, ostial LCx 70% with 100% OM1 and OM 2. Patent LIMA-LAD, SVG-D1, SeqSVG-OM1-OM2   Risk Assessment/Calculations:    She does have Atrial Fibrillation on Eliquis for DOAC.  No beta-blocker because of bradycardia.  Had been on and stop because of lung toxicity.  This patients CHA2DS2-VASc Score and unadjusted Ischemic Stroke Rate (% per year) is equal to 10.8 % stroke rate/year from a score of 8  Above score calculated as 1 point each if present [ CHF, HTN,DM,  Vascular=MI/PAD/Aortic Plaque, Age if 65-74, or Female] Above score calculated as 2 points each if present [Age > 75, or Stroke/TIA/TE]   HYPERTENSION CONTROL Vitals:   11/15/22 1608 11/15/22 2037  BP: (!) 160/84 (!) 154/78    The patient's blood pressure is elevated above target today.  In order to address the patient's elevated BP: Blood pressure will be monitored at home to determine if medication changes need to be made. (Restart amlodipine 5 mg daily with plans to potentially restart Entresto following catheterization.)            Physical Exam:   VS:  BP (!) 154/78   Pulse 74   Ht 5\' 2"  (1.575 m)   Wt 226 lb 12.8 oz (102.9 kg)   SpO2 97%   BMI 41.48 kg/m    Wt Readings from Last 3 Encounters:  11/15/22 226 lb 12.8 oz (102.9 kg)  11/10/22 223 lb 3.2 oz (101.2 kg)  11/05/22 221 lb 12.8 oz (100.6 kg)    GEN: Well nourished, well developed in no acute distress; morbidly obese with truncal and significant abdominal pannus.  Walks with a walker. NECK: No JVD; No carotid bruits CARDIAC: Normal S1, S2; RRR, no murmurs, rubs, gallops RESPIRATORY:  Clear to auscultation without rales, wheezing or rhonchi ; nonlabored, good air movement. ABDOMEN: Soft, non-tender, non-distended;  no HSM or bruit. EXTREMITIES:  No edema; No deformity ; she does have of a couple firm areas of hematoma on the right groin down in the lower inguinal region with some ecchymosis still standing.  Some firmness but no significant bruising.  Mild pitting edema.     ASSESSMENT AND PLAN: .    Problem List Items Addressed This Visit       Cardiology Problems   Angina, class IV (HCC) - Primary   Relevant Medications   amLODipine (NORVASC) 5 MG tablet   Chronic combined systolic (congestive) and diastolic (congestive) heart failure (HCC) (Chronic)   Relevant Medications   amLODipine (NORVASC) 5 MG tablet   Other Relevant Orders   EKG 12-Lead (Completed)   Coronary artery disease involving native  coronary artery of native heart with angina pectoris (HCC) (Chronic)   Relevant Medications   amLODipine (NORVASC) 5 MG tablet   Essential hypertension (Chronic)   Relevant Medications   amLODipine (NORVASC) 5 MG tablet   Hyperlipidemia associated with type 2 diabetes mellitus (HCC) (Chronic)    Currently on inclisiran and Zetia as well as rosuvastatin 40 mg daily most recent lipids have been LDL of 25 as of September 0272.  Outstanding control.  A1c 6.1 on Lantus plus Humalog insulin and Ozempic.  Hold Ozempic of the week of cath.      Relevant Medications   amLODipine (NORVASC) 5 MG tablet   Paroxysmal atrial fibrillation (HCC) (Chronic)   Relevant Medications   amLODipine (NORVASC) 5 MG tablet   Other Relevant Orders   EKG 12-Lead (Completed)    Assessment and Plan    Coronary Artery Disease-with class IV angina That: History of multiple stents in the right coronary artery with current stenosis causing angina.  Plan to perform angioplasty to open the stenosison the right coronary artery on October 23,  Discussed the potential use of drug-eluting balloons in the future if stenosis recurs. =  See Informed Decision Making - Consent Below. will attempt R Radial (& prep L groin).  Recent AKI: Check kidney function on the day of the procedure. -Continue Plavix. -Hold Eliquis for two days before the procedure. Restart amlodipine 5 mg p.o. for antianginal benefit and continue Ranexa. Continue to hold aspirin while she will be back on Eliquis. Continue Zetia, rosuvastatin 40 mg, and inclisiran  Chronic Combined Systolic and Diastolic Heart Failure heart Failure: / HTN (but with concerns for orthostatic hypotension Ejection fraction of 30-35%.  Currently off Entresto due to previous low blood pressure. Discussed potential future placement of a defibrillator with a biventricular pacemaker. -Resume Amlodipine for angina. -Hold off on restarting Entresto until after the angioplasty.   (Will discuss with Dr. Shirlee Latch to determine timing) -Continue Spironolactone 12.5mg  daily - hold AM of Cath.  -Continue Torsemide as needed for edema. -Not on beta-blocker because of bradycardia issues.         Informed Consent   Shared Decision Making/Informed ConsentThe risks [stroke (1 in 1000), death (1 in 1000), kidney failure [usually temporary] (1 in 500), bleeding (1 in 200), allergic reaction [possibly serious] (1 in 200)], benefits (diagnostic support and management of coronary artery disease) and alternatives of a cardiac catheterization were discussed in detail with Ms. Bethel and she is willing to proceed.      Dispo: Return in about 4 weeks (around 12/13/2022) for Routine Follow-up after testing ~ 1-2 months, Post cath visit with APP-in CHF clinic. -Follow-up in the advanced heart failure clinic post-procedure.  Total time spent: 35  min spent with patient + 33 min spent charting = 68 min      Signed, Marykay Lex, MD, MS Bryan Lemma, M.D., M.S. Interventional Cardiologist  [facility]  Pager # [phone] Phone # [phone] [address] [place], [place] [address]

## 2022-11-15 NOTE — Assessment & Plan Note (Signed)
Currently on inclisiran and Zetia as well as rosuvastatin 40 mg daily most recent lipids have been LDL of 25 as of September 1610.  Outstanding control.  A1c 6.1 on Lantus plus Humalog insulin and Ozempic.  Hold Ozempic of the week of cath.

## 2022-11-15 NOTE — Patient Instructions (Addendum)
Medication Instructions:   No changes in medications  Restart Amlodipine 5 mg daily  *If you need a refill on your cardiac medications before your next appointment, please call your pharmacy*   Lab Work: No labs needed  If you have labs (blood work) drawn today and your tests are completely normal, you will receive your results only by: MyChart Message (if you have MyChart) OR A paper copy in the mail If you have any lab test that is abnormal or we need to change your treatment, we will call you to review the results.   Testing/Procedures:  Will be  schedule  at cath Lab Your physician has requested that you have a cardiac catheterization. Cardiac catheterization is used to diagnose and/or treat various heart conditions. Doctors may recommend this procedure for a number of different reasons. The most common reason is to evaluate chest pain. Chest pain can be a symptom of coronary artery disease (CAD), and cardiac catheterization can show whether plaque is narrowing or blocking your heart's arteries. This procedure is also used to evaluate the valves, as well as measure the blood flow and oxygen levels in different parts of your heart.  Please follow instruction sheet, as given.\  Follow-Up: At [facility], you and your health needs are our priority.  As part of our continuing mission to provide you with exceptional heart care, we have created designated Provider Care Teams.  These Care Teams include your primary Cardiologist (physician) and Advanced Practice Providers (APPs -  Physician Assistants and Nurse Practitioners) who all work together to provide you with the care you need, when you need it.     Your next appointment:    3 to 4 week(s)  The format for your next appointment:   In Person  Provider:   Dr Shirlee Latch or APP Heart failure Clinic    Other Instructions   [facility] A DEPT OF [place]. [facility] AT [facility]  [address] [place] [address] [place] [address] Dept: [phone] Loc: [phone]  [name] [name]  11/15/2022  You are scheduled for a Cardiac Catheterization on Wednesday, October 23 with Dr. Bryan Lemma.  1. Please arrive at the [facility] (Main Entrance A) at [facility]: [address] [place], [place] [address] at 5:30 AM (This time is 2 hour(s) before your procedure to ensure your preparation). Free valet parking service is available. You will check in at ADMITTING. The support person will be asked to wait in the waiting room.  It is OK to have someone drop you off and come back when you are ready to be discharged.    Special note: Every effort is made to have your procedure done on time. Please understand that emergencies sometimes delay scheduled procedures.  2. Diet: Do not eat solid foods after midnight.  The patient may have clear liquids until 5am upon the day of the procedure.  3. Labs:  not needed  4. Medication instructions in preparation for your procedure:   Contrast Allergy: No     Stop taking Eliquis (Apixiban) on Monday, October 21. Take morning dose only   Stop taking, spirolactone  Wednesday, October 23,  Do Not take Ozempic on Saturday Nov 20, 2022  Take half dose of sliding scale  of Lantus  and Humalog  the night of  Tuesday, October 22,  On the morning of your procedure, take your Plavix/Clopidogrel and any morning medicines NOT listed above.  You may  use sips of water.  5. Plan to go home the same day, you will only stay overnight if medically necessary. 6. Bring a current list of your medications and current insurance cards. 7. You MUST have a responsible person to drive you home. 8. Someone MUST be with you the first 24 hours after you arrive home or your discharge will be delayed. 9. Please wear clothes that are easy to get on and off and wear slip-on shoes.  Thank you for allowing [place] to care for you!   -- [place]  Invasive Cardiovascular services

## 2022-11-15 NOTE — H&P (View-Only) (Signed)
Cardiology Office Note: INTERVENTIONAL CARDIOLOGY CONSULTATION.   Date:  11/15/2022  ID:  [name] [name], DOB 22-Oct-1945, [name] [id number] PCP: Mosetta Putt, MD  [facility] Providers Interventional Cardiologist:  Bryan Lemma, MD Advanced Heart Failure:  Marca Ancona, MD     Chief Complaint  Patient presents with   Coronary Artery Disease    Interventional cardiology consult with known CAD-plans for PCI to RCA.    Patient Profile: .     [name] [name] is a morbidly obese 77 y.o. female with a PMH notable for CAD-CABG x 5 (LIMA-LAD, SVG-D1, SeqSVG-OM1-OM 2, SVG-RCA with known occlusion of SVG-RCA status post CTO PCI of RCA in 2015 and 4 overlapping DES), and newly diagnosed cardiomyopathy with reduced EF from 55% to 30-35% on recent echo and new significant ISR of RCA stent who presents here for interventional consultation to discuss RCA PCI at the request of Mosetta Putt, MD.  Please see detailed heart failure clinic note from 11/05/2022 for details.  [name] [name] is followed by Dr. Shirlee Latch in the advanced heart failure clinic.  Recently evaluated for worsening EF and ongoing chest pain with echo and cardiac catheterization.  Initial plans were for staged PCI, but delayed because of AKI and hematoma.     Subjective   She was seen by Dr. Shirlee Latch on October 4 for post-cath follow-up.  He performed right left heart catheterization for evaluation of ongoing chest tightness would last for weeks with worsening EF on echo.  After her catheterization she actually had a short hospitalization because of right groin hematoma post cath with renal insufficiency related to contrast nephropathy.  Following hospitalization, her Entresto and spironolactone held because of hypotension and renal insufficiency.  She was still noted to have persistent 4-10 chest pain with minimal exertion.  Following hospitalization, multiple medications have not held including amlodipine and Entresto.  Not yet  started.  Plan was to reschedule for staged PCI.  Entresto still held because of transient dizziness-mostly orthostatic.Marland Kitchen  Low-dose spironolactone started.  She was then seen by Dr. Anne Fu on October 9 still with concern for chest discomfort and fearful of chronic occlusion of her RCA stents. => Referred for ICD consultation.   INTERVAL HPI Discussed the use of AI scribe software for clinical note transcription with the patient, who gave verbal consent to proceed.  History of Present Illness   The patient, with a history of heart disease and diabetes, presented with worsening angina and shortness of breath. The patient reported difficulty in walking short distances due to breathlessness and pressure pain in the chest. The patient's symptoms have been progressively worsening, impacting daily activities.  The patient has a history of multiple stents placed in the right coronary artery in 2014 and 2015. Despite this, the patient's angina has persisted and recently worsened. The patient also reported a fall at home after the recent hospitalization, resulting in bruising and discomfort.  The patient's blood pressure has been unstable since the recent hospitalization, leading to adjustments in medication. The patient was taken off Aldactone due to low blood pressure in the hospital but has since been put back on a reduced dose.  The patient has a history of diabetes, managed with Ozempic. The patient also reported back issues, specifically degenerative disc disease, which may impact comfort during the upcoming procedure. The patient's kidney function was recently checked and reported to be back to normal.  The patient has expressed a desire for aggressive treatment if complications arise during the procedure but  does not wish for long-term life support measures. The patient's husband is her primary caregiver and will be present during the procedure.       ROS:  Cardiovascular ROS: positive for -  chest pain, dyspnea on exertion, irregular heartbeat, paroxysmal nocturnal dyspnea, and shortness of breath negative for - irregular heartbeat, orthopnea, palpitations, rapid heart rate, or syncope or near syncope, TIA or emesis DES, claudication Review of Systems - Negative except symptoms noted above.  Hematoma seems improving.     Objective   Studies Reviewed: Marland Kitchen   EKG Interpretation Date/Time:  Monday November 15 2022 16:13:31 EDT Ventricular Rate:  74 PR Interval:  158 QRS Duration:  170 QT Interval:  440 QTC Calculation: 488 R Axis:   138  Text Interpretation: Normal sinus rhythm Right axis deviation Left bundle branch block When compared with ECG of 05-Nov-2022 11:11, Questionable change in QRS axis Inverted T waves have replaced nonspecific T wave abnormality in Inferior leads Confirmed by Bryan Lemma ([id number]) on 11/15/2022 5:06:23 PM    ECHO 09/09/2022: LVEF 30 to 35% with global HK.  Mild LV dilation, GR 1 DD.  Elevated LAP.  Severe LA dilation.  Normal RV size.  Severe MAC with mild to moderate MR.  AOV sclerosis but no stenosis.  Normal RAP. Notably reduced EF compared to 12/2020-EF 60 to 65% with moderate LVH. R&LHC 10/22/2022: Mildly elevated patient BP and LVEDP with mixed pulmonary venous and pulmonary arterial hypertension.  PVR 3 [place]. Known CTO SVG-rPDA, with extensive stents in the RCA with 50% ostial ISR, 99% proximal by 70% and then 40% mid to distal RCA ISR. Proximal LAD 90% followed by 100% at D1, ostial LCx 70% with 100% OM1 and OM 2. Patent LIMA-LAD, SVG-D1, SeqSVG-OM1-OM2   Risk Assessment/Calculations:    She does have Atrial Fibrillation on Eliquis for DOAC.  No beta-blocker because of bradycardia.  Had been on and stop because of lung toxicity.  This patients CHA2DS2-VASc Score and unadjusted Ischemic Stroke Rate (% per year) is equal to 10.8 % stroke rate/year from a score of 8  Above score calculated as 1 point each if present [ CHF, HTN,DM,  Vascular=MI/PAD/Aortic Plaque, Age if 65-74, or Female] Above score calculated as 2 points each if present [Age > 75, or Stroke/TIA/TE]   HYPERTENSION CONTROL Vitals:   11/15/22 1608 11/15/22 2037  BP: (!) 160/84 (!) 154/78    The patient's blood pressure is elevated above target today.  In order to address the patient's elevated BP: Blood pressure will be monitored at home to determine if medication changes need to be made. (Restart amlodipine 5 mg daily with plans to potentially restart Entresto following catheterization.)            Physical Exam:   VS:  BP (!) 154/78   Pulse 74   Ht 5\' 2"  (1.575 m)   Wt 226 lb 12.8 oz (102.9 kg)   SpO2 97%   BMI 41.48 kg/m    Wt Readings from Last 3 Encounters:  11/15/22 226 lb 12.8 oz (102.9 kg)  11/10/22 223 lb 3.2 oz (101.2 kg)  11/05/22 221 lb 12.8 oz (100.6 kg)    GEN: Well nourished, well developed in no acute distress; morbidly obese with truncal and significant abdominal pannus.  Walks with a walker. NECK: No JVD; No carotid bruits CARDIAC: Normal S1, S2; RRR, no murmurs, rubs, gallops RESPIRATORY:  Clear to auscultation without rales, wheezing or rhonchi ; nonlabored, good air movement. ABDOMEN: Soft, non-tender, non-distended;  no HSM or bruit. EXTREMITIES:  No edema; No deformity ; she does have of a couple firm areas of hematoma on the right groin down in the lower inguinal region with some ecchymosis still standing.  Some firmness but no significant bruising.  Mild pitting edema.     ASSESSMENT AND PLAN: .    Problem List Items Addressed This Visit       Cardiology Problems   Angina, class IV (HCC) - Primary   Relevant Medications   amLODipine (NORVASC) 5 MG tablet   Chronic combined systolic (congestive) and diastolic (congestive) heart failure (HCC) (Chronic)   Relevant Medications   amLODipine (NORVASC) 5 MG tablet   Other Relevant Orders   EKG 12-Lead (Completed)   Coronary artery disease involving native  coronary artery of native heart with angina pectoris (HCC) (Chronic)   Relevant Medications   amLODipine (NORVASC) 5 MG tablet   Essential hypertension (Chronic)   Relevant Medications   amLODipine (NORVASC) 5 MG tablet   Hyperlipidemia associated with type 2 diabetes mellitus (HCC) (Chronic)    Currently on inclisiran and Zetia as well as rosuvastatin 40 mg daily most recent lipids have been LDL of 25 as of September 0272.  Outstanding control.  A1c 6.1 on Lantus plus Humalog insulin and Ozempic.  Hold Ozempic of the week of cath.      Relevant Medications   amLODipine (NORVASC) 5 MG tablet   Paroxysmal atrial fibrillation (HCC) (Chronic)   Relevant Medications   amLODipine (NORVASC) 5 MG tablet   Other Relevant Orders   EKG 12-Lead (Completed)    Assessment and Plan    Coronary Artery Disease-with class IV angina That: History of multiple stents in the right coronary artery with current stenosis causing angina.  Plan to perform angioplasty to open the stenosison the right coronary artery on October 23,  Discussed the potential use of drug-eluting balloons in the future if stenosis recurs. =  See Informed Decision Making - Consent Below. will attempt R Radial (& prep L groin).  Recent AKI: Check kidney function on the day of the procedure. -Continue Plavix. -Hold Eliquis for two days before the procedure. Restart amlodipine 5 mg p.o. for antianginal benefit and continue Ranexa. Continue to hold aspirin while she will be back on Eliquis. Continue Zetia, rosuvastatin 40 mg, and inclisiran  Chronic Combined Systolic and Diastolic Heart Failure heart Failure: / HTN (but with concerns for orthostatic hypotension Ejection fraction of 30-35%.  Currently off Entresto due to previous low blood pressure. Discussed potential future placement of a defibrillator with a biventricular pacemaker. -Resume Amlodipine for angina. -Hold off on restarting Entresto until after the angioplasty.   (Will discuss with Dr. Shirlee Latch to determine timing) -Continue Spironolactone 12.5mg  daily - hold AM of Cath.  -Continue Torsemide as needed for edema. -Not on beta-blocker because of bradycardia issues.         Informed Consent   Shared Decision Making/Informed ConsentThe risks [stroke (1 in 1000), death (1 in 1000), kidney failure [usually temporary] (1 in 500), bleeding (1 in 200), allergic reaction [possibly serious] (1 in 200)], benefits (diagnostic support and management of coronary artery disease) and alternatives of a cardiac catheterization were discussed in detail with [name] [name] and she is willing to proceed.      Dispo: Return in about 4 weeks (around 12/13/2022) for Routine Follow-up after testing ~ 1-2 months, Post cath visit with APP-in CHF clinic. -Follow-up in the advanced heart failure clinic post-procedure.  Total time spent: 35  min spent with patient + 33 min spent charting = 68 min      Signed, [name] Lex, MD, MS Bryan Lemma, M.D., M.S. Interventional Cardiologist  [facility]  Pager # [phone] Phone # [phone] [address] [place], [place] [address]

## 2022-11-17 ENCOUNTER — Telehealth (HOSPITAL_COMMUNITY): Payer: Self-pay | Admitting: Cardiology

## 2022-11-17 NOTE — Telephone Encounter (Signed)
VO NEEDED FOR [facility] SERVICES PT CURRENTLY ENROLLED IN PT/PT SERVICES DURING ENROLLMENT RN THOUGHT PT WOULD BE A GREAT CANDIDATE FOR HF EDUCATION PROGRAM   VO GIVEN PER DR [facility]

## 2022-11-23 ENCOUNTER — Telehealth: Payer: Self-pay | Admitting: *Deleted

## 2022-11-23 NOTE — Telephone Encounter (Signed)
Coronary Stent scheduled at [facility] for: Wednesday November 24, 2022 8:30 AM Arrival time [facility] Main Entrance A at: 6:30 AM  Nothing to eat after midnight prior to procedure, clear liquids until 5 AM day of procedure.  Medication instructions: -Hold:  Eliquis-pt reports none 11/22/22 and will hold until post procedure  Spironolactone-day before and day of procedure-per protocol GFR < 60 (59)-pt knows she can take 11/23/22 if needed  Insulin -AM of procedure 1/2 usual Insulin dose HS prior to procedure  Ozempic-weekly on Saturdays-pt did not take 11/20/22 -Other usual morning medications can be taken with sips of water including Plavix 75 mg.  Per Dr Leeroy Cha aspirin AM of procedure  Plan to go home the same day, you will only stay overnight if medically necessary.  You must have responsible adult to drive you home.  Someone must be with you the first 24 hours after you arrive home.  Reviewed procedure instructions with patient.

## 2022-11-24 ENCOUNTER — Encounter (HOSPITAL_COMMUNITY)
Admission: RE | Disposition: A | Discharge status: Home / Self Care | Payer: Self-pay | Source: Home / Self Care | Attending: Cardiology

## 2022-11-24 ENCOUNTER — Ambulatory Visit (HOSPITAL_COMMUNITY)
Admission: RE | Admit: 2022-11-24 | Discharge: 2022-11-24 | Disposition: A | Discharge status: Home / Self Care | Payer: Medicare Other | Attending: Cardiology | Admitting: Cardiology

## 2022-11-24 ENCOUNTER — Other Ambulatory Visit: Payer: Self-pay

## 2022-11-24 DIAGNOSIS — I5042 Chronic combined systolic (congestive) and diastolic (congestive) heart failure: Secondary | ICD-10-CM | POA: Insufficient documentation

## 2022-11-24 DIAGNOSIS — Z951 Presence of aortocoronary bypass graft: Secondary | ICD-10-CM | POA: Diagnosis not present

## 2022-11-24 DIAGNOSIS — I110 Hypertensive heart disease with heart failure: Secondary | ICD-10-CM | POA: Diagnosis not present

## 2022-11-24 DIAGNOSIS — T82855A Stenosis of coronary artery stent, initial encounter: Secondary | ICD-10-CM | POA: Diagnosis present

## 2022-11-24 DIAGNOSIS — Z79899 Other long term (current) drug therapy: Secondary | ICD-10-CM | POA: Insufficient documentation

## 2022-11-24 DIAGNOSIS — Z886 Allergy status to analgesic agent status: Secondary | ICD-10-CM | POA: Insufficient documentation

## 2022-11-24 DIAGNOSIS — E119 Type 2 diabetes mellitus without complications: Secondary | ICD-10-CM | POA: Insufficient documentation

## 2022-11-24 DIAGNOSIS — Z9861 Coronary angioplasty status: Secondary | ICD-10-CM

## 2022-11-24 DIAGNOSIS — Z6841 Body Mass Index (BMI) 40.0 and over, adult: Secondary | ICD-10-CM | POA: Insufficient documentation

## 2022-11-24 DIAGNOSIS — Z7901 Long term (current) use of anticoagulants: Secondary | ICD-10-CM | POA: Diagnosis not present

## 2022-11-24 DIAGNOSIS — E785 Hyperlipidemia, unspecified: Secondary | ICD-10-CM | POA: Insufficient documentation

## 2022-11-24 DIAGNOSIS — I25112 Atherosclerotic heart disease of native coronary artery with refractory angina pectoris: Secondary | ICD-10-CM | POA: Insufficient documentation

## 2022-11-24 DIAGNOSIS — Z794 Long term (current) use of insulin: Secondary | ICD-10-CM | POA: Diagnosis not present

## 2022-11-24 DIAGNOSIS — Z7902 Long term (current) use of antithrombotics/antiplatelets: Secondary | ICD-10-CM | POA: Insufficient documentation

## 2022-11-24 DIAGNOSIS — I429 Cardiomyopathy, unspecified: Secondary | ICD-10-CM | POA: Insufficient documentation

## 2022-11-24 DIAGNOSIS — T82858D Stenosis of vascular prosthetic devices, implants and grafts, subsequent encounter: Secondary | ICD-10-CM

## 2022-11-24 DIAGNOSIS — Z955 Presence of coronary angioplasty implant and graft: Secondary | ICD-10-CM | POA: Insufficient documentation

## 2022-11-24 DIAGNOSIS — Y832 Surgical operation with anastomosis, bypass or graft as the cause of abnormal reaction of the patient, or of later complication, without mention of misadventure at the time of the procedure: Secondary | ICD-10-CM | POA: Insufficient documentation

## 2022-11-24 DIAGNOSIS — Z7985 Long-term (current) use of injectable non-insulin antidiabetic drugs: Secondary | ICD-10-CM | POA: Diagnosis not present

## 2022-11-24 DIAGNOSIS — I25708 Atherosclerosis of coronary artery bypass graft(s), unspecified, with other forms of angina pectoris: Secondary | ICD-10-CM | POA: Diagnosis present

## 2022-11-24 DIAGNOSIS — Z8673 Personal history of transient ischemic attack (TIA), and cerebral infarction without residual deficits: Secondary | ICD-10-CM | POA: Insufficient documentation

## 2022-11-24 HISTORY — PX: CORONARY BALLOON ANGIOPLASTY: CATH118233

## 2022-11-24 LAB — GLUCOSE, CAPILLARY
Glucose-Capillary: 150 mg/dL — ABNORMAL HIGH (ref 70–99)
Glucose-Capillary: 118 mg/dL — ABNORMAL HIGH (ref 70–99)

## 2022-11-24 LAB — POCT ACTIVATED CLOTTING TIME
Activated Clotting Time: 263 s
Activated Clotting Time: 354 s
Activated Clotting Time: 238 s

## 2022-11-24 SURGERY — CORONARY BALLOON ANGIOPLASTY
Anesthesia: LOCAL

## 2022-11-24 MED ORDER — SODIUM CHLORIDE 0.9 % IV SOLN
INTRAVENOUS | Status: AC
Start: 2022-11-24 — End: 2022-11-24

## 2022-11-24 MED ORDER — VERAPAMIL HCL 2.5 MG/ML IV SOLN
INTRAVENOUS | Status: AC
Start: 2022-11-24 — End: ?
  Filled 2022-11-24: qty 2

## 2022-11-24 MED ORDER — HYDRALAZINE HCL 20 MG/ML IJ SOLN
INTRAMUSCULAR | Status: AC
Start: 2022-11-24 — End: ?
  Filled 2022-11-24: qty 1

## 2022-11-24 MED ORDER — CLOPIDOGREL BISULFATE 75 MG PO TABS: 75.0000 mg | ORAL_TABLET | Freq: Every morning | ORAL | Status: DC

## 2022-11-24 MED ORDER — CLOPIDOGREL BISULFATE 75 MG PO TABS: 75.0000 mg | ORAL_TABLET | ORAL | Status: DC

## 2022-11-24 MED ORDER — VERAPAMIL HCL 2.5 MG/ML IV SOLN
INTRAVENOUS | Status: DC | PRN
  Administered 2022-11-24: 10 mL via INTRA_ARTERIAL

## 2022-11-24 MED ORDER — ASPIRIN 81 MG PO CHEW
81.0000 mg | CHEWABLE_TABLET | Freq: Once | ORAL | Status: AC
  Administered 2022-11-24: 81 mg via ORAL
  Filled 2022-11-24: qty 1

## 2022-11-24 MED ORDER — SODIUM CHLORIDE 0.9 % IV SOLN: 250.0000 mL | INTRAVENOUS | Status: DC | PRN

## 2022-11-24 MED ORDER — HYDRALAZINE HCL 20 MG/ML IJ SOLN
INTRAMUSCULAR | Status: DC | PRN
  Administered 2022-11-24: 10 mg via INTRAVENOUS

## 2022-11-24 MED ORDER — LIDOCAINE HCL (PF) 1 % IJ SOLN
INTRAMUSCULAR | Status: AC
  Filled 2022-11-24: qty 30

## 2022-11-24 MED ORDER — LABETALOL HCL 5 MG/ML IV SOLN
INTRAVENOUS | Status: AC
Start: 2022-11-24 — End: ?
  Filled 2022-11-24: qty 4

## 2022-11-24 MED ORDER — HEPARIN SODIUM (PORCINE) 1000 UNIT/ML IJ SOLN
INTRAMUSCULAR | Status: AC
  Filled 2022-11-24: qty 20

## 2022-11-24 MED ORDER — SODIUM CHLORIDE 0.9% FLUSH: 3.0000 mL | INTRAVENOUS | Status: DC | PRN

## 2022-11-24 MED ORDER — SODIUM CHLORIDE 0.9 % IV SOLN: INTRAVENOUS | Status: DC

## 2022-11-24 MED ORDER — LIDOCAINE HCL (PF) 1 % IJ SOLN
INTRAMUSCULAR | Status: DC | PRN
  Administered 2022-11-24: 2 mL

## 2022-11-24 MED ORDER — HEPARIN (PORCINE) IN NACL 1000-0.9 UT/500ML-% IV SOLN
INTRAVENOUS | Status: DC | PRN
  Administered 2022-11-24 (×2): 500 mL via INTRA_ARTERIAL

## 2022-11-24 MED ORDER — HYDRALAZINE HCL 20 MG/ML IJ SOLN: 10.0000 mg | INTRAMUSCULAR | Status: DC | PRN

## 2022-11-24 MED ORDER — ACETAMINOPHEN 325 MG PO TABS
650.0000 mg | ORAL_TABLET | ORAL | Status: DC | PRN
Start: 2022-11-24 — End: 2022-11-24

## 2022-11-24 MED ORDER — LABETALOL HCL 5 MG/ML IV SOLN
INTRAVENOUS | Status: DC | PRN
  Administered 2022-11-24: 10 mg via INTRAVENOUS

## 2022-11-24 MED ORDER — LABETALOL HCL 5 MG/ML IV SOLN: 10.0000 mg | INTRAVENOUS | Status: DC | PRN

## 2022-11-24 MED ORDER — IOHEXOL 350 MG/ML SOLN
INTRAVENOUS | Status: DC | PRN
  Administered 2022-11-24: 50 mL via INTRA_ARTERIAL

## 2022-11-24 MED ORDER — HEPARIN SODIUM (PORCINE) 1000 UNIT/ML IJ SOLN
INTRAMUSCULAR | Status: DC | PRN
  Administered 2022-11-24: 3000 [IU] via INTRAVENOUS
  Administered 2022-11-24: 9000 [IU] via INTRAVENOUS
  Administered 2022-11-24: 3000 [IU] via INTRAVENOUS

## 2022-11-24 MED ORDER — ONDANSETRON HCL 4 MG/2ML IJ SOLN: 4.0000 mg | Freq: Four times a day (QID) | INTRAMUSCULAR | Status: DC | PRN

## 2022-11-24 MED ORDER — SODIUM CHLORIDE 0.9% FLUSH: 3.0000 mL | Freq: Two times a day (BID) | INTRAVENOUS | Status: DC

## 2022-11-24 SURGICAL SUPPLY — 28 items
BALLN EMERGE MR 2.5X20 (BALLOONS) ×2
BALLN SCOREFLEX 3.0X10 (BALLOONS) ×1
BALLN SCOREFLEX 3.50X15 (BALLOONS) ×1
BALLN ~~LOC~~ EMERGE MR 3.75X12 (BALLOONS) ×1
BALLN ~~LOC~~ EMERGE MR 3.75X15 (BALLOONS) ×1
BALLOON EMERGE MR 2.5X20 (BALLOONS) IMPLANT
BALLOON SCOREFLEX 3.0X10 (BALLOONS) IMPLANT
BALLOON SCOREFLEX 3.50X15 (BALLOONS) IMPLANT
BALLOON TAKERU 2.0X8 (BALLOONS) IMPLANT
BALLOON ~~LOC~~ EMERGE MR 3.75X12 (BALLOONS) IMPLANT
BALLOON ~~LOC~~ EMERGE MR 3.75X15 (BALLOONS) IMPLANT
CATH INFINITI JR4 5F (CATHETERS) IMPLANT
CATH LAUNCHER 6FR 3DRC (CATHETERS) IMPLANT
CATH LAUNCHER 6FR AL.75 (CATHETERS) IMPLANT
CATHETER LAUNCHER 6FR 3DRC (CATHETERS) ×1
CATHETER LAUNCHER 6FR JR4 SH (CATHETERS) IMPLANT
DEVICE RAD COMP TR BAND LRG (VASCULAR PRODUCTS) IMPLANT
ELECT DEFIB PAD ADLT CADENCE (PAD) IMPLANT
GLIDESHEATH SLEND SS 6F .021 (SHEATH) IMPLANT
GUIDEWIRE INQWIRE 1.5J.035X260 (WIRE) IMPLANT
INQWIRE 1.5J .035X260CM (WIRE) ×1
KIT ENCORE 26 ADVANTAGE (KITS) IMPLANT
KIT SYRINGE INJ CVI SPIKEX1 (MISCELLANEOUS) IMPLANT
PACK CARDIAC CATHETERIZATION (CUSTOM PROCEDURE TRAY) ×1 IMPLANT
SET ATX-X65L (MISCELLANEOUS) IMPLANT
SHEATH PROBE COVER 6X72 (BAG) IMPLANT
WIRE RUNTHROUGH .014X180CM (WIRE) IMPLANT
WIRE RUNTHROUGH IZANAI 014 180 (WIRE) IMPLANT

## 2022-11-24 NOTE — Discharge Instructions (Signed)
Drink plenty of fluids for 48 hours and keep wrist elevated at heart level for 24 hours  Radial Site Care   This sheet gives you information about how to care for yourself after your procedure. Your health care provider may also give you more specific instructions. If you have problems or questions, contact your health care provider. What can I expect after the procedure? After the procedure, it is common to have: Bruising and tenderness at the catheter insertion area. Follow these instructions at home: Medicines Take over-the-counter and prescription medicines only as told by your health care provider. Insertion site care Follow instructions from your health care provider about how to take care of your insertion site. Make sure you: Wash your hands with soap and water before you change your bandage (dressing). If soap and water are not available, use hand sanitizer. Remove your dressing as told by your health care provider. In 24 hours Check your insertion site every day for signs of infection. Check for: Redness, swelling, or pain. Fluid or blood. Pus or a bad smell. Warmth. Do not take baths, swim, or use a hot tub until your health care provider approves. You may shower 24-48 hours after the procedure, or as directed by your health care provider. Remove the dressing and gently wash the site with plain soap and water. Pat the area dry with a clean towel. Do not rub the site. That could cause bleeding. Do not apply powder or lotion to the site. Activity   For 24 hours after the procedure, or as directed by your health care provider: Do not flex or bend the affected arm. Do not push or pull heavy objects with the affected arm. Do not drive yourself home from the hospital or clinic. You may drive 24 hours after the procedure unless your health care provider tells you not to. Do not operate machinery or power tools. Do not lift anything that is heavier than 10 lb (4.5 kg), or the  limit that you are told, until your health care provider says that it is safe.  For 4 days Ask your health care provider when it is okay to: Return to work or school. Resume usual physical activities or sports. Resume sexual activity. General instructions If the catheter site starts to bleed, raise your arm and put firm pressure on the site. If the bleeding does not stop, get help right away. This is a medical emergency. If you went home on the same day as your procedure, a responsible adult should be with you for the first 24 hours after you arrive home. Keep all follow-up visits as told by your health care provider. This is important. Contact a health care provider if: You have a fever. You have redness, swelling, or yellow drainage around your insertion site. Get help right away if: You have unusual pain at the radial site. The catheter insertion area swells very fast. The insertion area is bleeding, and the bleeding does not stop when you hold steady pressure on the area. Your arm or hand becomes pale, cool, tingly, or numb. These symptoms may represent a serious problem that is an emergency. Do not wait to see if the symptoms will go away. Get medical help right away. Call your local emergency services (911 in the [place] Do not drive yourself to the hospital. Summary After the procedure, it is common to have bruising and tenderness at the site. Follow instructions from your health care provider about how to take care of your   radial site wound. Check the wound every day for signs of infection. Do not lift anything that is heavier than 10 lb (4.5 kg), or the limit that you are told, until your health care provider says that it is safe. This information is not intended to replace advice given to you by your health care provider. Make sure you discuss any questions you have with your health care provider. Document Revised: 02/23/2017 Document Reviewed: 02/23/2017 Elsevier Patient Education   2020 [employer]  

## 2022-11-24 NOTE — Interval H&P Note (Signed)
History and Physical Interval Note:  11/24/2022 7:48 AM  [name] [name]  has presented today for surgery, with the diagnosis of progressive angina with CAD.  The various methods of treatment have been discussed with the patient and family. After consideration of risks, benefits and other options for treatment, the patient has consented to  Procedure(s): CORONARY STENT INTERVENTION (N/A) as a surgical intervention.  The patient's history has been reviewed, patient examined, no change in status, stable for surgery.  I have reviewed the patient's chart and labs.  Questions were answered to the patient's satisfaction.    Cath Lab Visit (complete for each Cath Lab visit)  Clinical Evaluation Leading to the Procedure:   ACS: No.  Non-ACS:  Planned Staged PCI  Anginal Classification: CCS IV  Anti-ischemic medical therapy: Maximal Therapy (2 or more classes of medications)  Non-Invasive Test Results: No non-invasive testing performed  Prior CABG: Previous CABG    Bryan Lemma

## 2022-11-24 NOTE — Discharge Summary (Signed)
Discharge Summary for Same Day PCI   Patient ID: [name] [name] [name]: [id number]; DOB: 1945/09/13  Admit date: 11/24/2022 Discharge date: 11/24/2022  Primary Care Provider: Mosetta Putt, MD  Primary Cardiologist: Bryan Lemma, MD  Primary Electrophysiologist:  None   Discharge Diagnoses    Principal Problem:   Coronary artery disease of bypass graft of native heart with stable angina pectoris [facility])   Diagnostic Studies/Procedures    Cardiac Catheterization 11/24/2022:    Ost RCA to Prox RCA lesion is 50% stenosed.  Prox RCA lesion is 99% stenosed.  Prox RCA to Mid RCA lesion is 45% stenosed.   Scoring balloon angioplasty was performed using a BALLN SCOREFLEX 3.0X10 => 3.5 x 15, post-dilated with BALLN [place] EMERGE MR 3.75X12.   Post intervention, there is a 20% residual stenosis in the Ost & Prox segments, and 10% residual stenosis in the Prox-Mid RCA segment.   Mid RCA lesion is 70% stenosed.   Scoring balloon angioplasty was performed using a BALLN SCOREFLEX 3.0X10; Post intervention, there is a 30% residual stenosis.   Dist RCA stent is 40% stenosed.   ------------------   LCA & Grafts were not Injected   POST-CATH FINDINGS Successful scoring balloon and high-pressure [place] balloon PTCA of diffuse in-stent restenosis of ostial to distal RCA stents reducing stenosis to 30% distally and 20% proximally. Good expansion with TIMI-3 flow.      RECOMMENDATIONS She should follow-up with Dr. Shirlee Latch as scheduled.  Pain we do continue to titrate up her CHF medications.   In the absence of any other complications or medical issues, we expect the patient to be ready for discharge from an interventional cardiology perspective on 11/24/2022.   Recommend to resume Apixaban, at currently prescribed dose and frequency on 11/24/2022.   Recommend concurrent antiplatelet therapy of Clopidogrel 75mg  daily for 12 months.   If able to tolerate combination of epidural and apixaban, would consider  continuing longer-term Plavix given her aspirin allergy.  Bryan Lemma, MD  Diagnostic Dominance: Right  Intervention   _____________   History of Present Illness     [name] [name] is a 77 y.o. female with a history of CAD s/p CABG, diastolic CHF, and cerebrovascular disease with history of CVA presents for followup of CHF and diastolic CHF. She had CABG x 5 in 12/11.  Prior to the CABG she had multiple PCIs.  She had a CVA in 2/14 that presented as visual loss.  She had a left carotid stent in 2/15.    Left heart cath done in Sept. 2014. This showed patent SVG-D, LIMA-LAD, and sequential SVG-OM branches but SVG-RCA and the native RCA were both totally occluded. It was felt that her increased symptoms coincided with occlusion of SVG-RCA.  She was started on Imdur to see if this would help with the chest pain and dyspnea.  However, she feels like Imdur causes leg cramps and does not think that she can take it.  So ranolazine 500 mg bid started and titrated this up to 1000 mg bid.  This helped some but not markedly. Therefore, sent to Dr. Eldridge Dace to address opening RCA CTO. He was able to do this in 5/15 with 4 overlapping Xience DES.  This led to resolution of exertional chest pain.     [name] [name] was started on Brilinta after PCI.  She became much more short of breath after starting on Brilinta. Brilinta stopped and replaced with Plavix.  Dyspnea improved off Brilinta. Given increasing exertional dyspnea  and chest pain,  RHC/LHC was done in 4/17.  This showed stable CAD with no interventional target.  Left and right heart filling pressures were not significantly increased.  Medical management.    She had an MRI/MRA in May 2018 that showed moderately severe stenosis of the supraclinoid segment of the right internal carotid artery, progressed since the 2016 MR angiogram, and severe basilar artery stenosis.    Echo 1/19 with EF 55-60%, moderate diastolic dysfunction, PASP 50 mmHg, mildly dilated  RV, mild AS, mild-moderate MR.    She reported increased dyspnea and chest pain and had RHC/LHC in 9/19.  She had DES to sequential SVG-OM1/PLOM.  While hospitalized, she was noted to have runs of atrial fibrillation.  She was very symptomatic with the atrial fibrillation.  Eliquis and amiodarone were started.    She was admitted 12/16-12/19/19 for cardiomems implant and RHC. She was diuresed with IV lasix and transitioned to torsemide 100 mg BID + metolazone once/week. DC weight: 295 lbs.   She had a prolonged hospitalization in 2/20 with acute respiratory failure and fever to 103.  She was thought to have PNA and treated with cefepime.  She was intubated.  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  Amiodarone was stopped and she was put on prednisone. Echo in 2/20 showed EF down to 25-30% with mid-apical LV severe hypokinesis. She had AKI. Possible ITP triggered by infection, HIT negative.  ITP was treated with Solumedrol. She was discharged to SNF.     ORIF left ankle 4/20.    11/20 admission initially with concern for TIAs (staring spells), ended up thinking possible partial seizures.  Head MRI did not show CVA.  Echo in 11/20 showed EF 45-50%, moderately decreased RV systolic function, moderate RV enlargement, moderate-severe MR, moderate TR.    Patient was admitted again in 12/20 with AKI and hypotension in the setting of sinus bradycardia (HR 30s).  She was volume overloaded on exam.  Toprol XL was stopped and HR increased to 60s.  She was diuresed with IV Lasix.  TEE in 12/20 showed EF 50-55%, septal-lateral dyssynchrony, mildly decreased RV function, moderate MR.      Echo in 5/22 showed EF 60-65%, moderate LVH, normal RV, PASP 37 mmHg, 2.5 x 2.1 cm calcified mass posterior mitral annulus with mild-moderate MR => ?severe MAC but more circumscribed and prominent than in the past, mild AS mean gradient 12 mmHg and AVA 1.83 cm^2.  Echo in 11/22 showed EF 55%, normal  RV, mass posterior mitral annulus, moderate MR, mild AS, IVC normal.  Cardiac MRI was done in 1/23 to investigate posterior mitral annulus mass; study showed LV EF 48%, mild LV dilation, normal RV with EF 53%, extensive MAC is likely the source of the posterior mitral annulus mass, basal-mid inferior >50% subendocardial LGE suggestive of prior inferior MI.    Follow up 2/23 she had chest tightness x 4 weeks. Arranged for [facility] which showed in-stent restenosis of pRCA, felt to be the source of her symptoms given occluded SVG-RCA. Underwent PTCA to pRCA. RHC with normal PCWP and RA pressure, mild pulmonary hypertension, preserved CO. On ASA + Plavix + Eliquis x 1 month (until 05/21/21), then Plavix + Eliquis.    Echo 8/24 showed EF significantly worse, down to 30-35%, G1DD, normal RV.    Admitted after elective 10/22/2022 L/RHC after she reported worsening exertional dyspnea and chest tightness.  LHC/RHC showed mildly elevated PCWP with normal RA pressure, mixed pulmonary venous/pulmonary  arterial hypertension, and 99% in-stent restenosis in the proximal RCA, at least 2 layers of stent are present. Admitted for cutting balloon angioplasty to ISR in the RCA. Stay complicated by AKI and acute hematoma at cath site with hypotension. Decision was made to defer staged PCI and continue medical therapy.   Seen in the AHF office on 10/4 and continued to have episodes of chest pain. Referred back to Dr. Herbie Baltimore (seen 10/14) with plans for angioplasty 10/23 to the RCA via radial approach.   Hospital Course     The patient underwent cardiac cath as noted above with successful scoring balloon and high-pressure 9 compliant balloon PTCA of diffuse in-stent restenosis of ostial to distal RCA stents reducing stenosis to 30% distally and 20% proximally. Plan for continuation of long-term Plavix (given aspirin allergy), along with Eliquis. The patient was seen by cardiac rehab while in short stay. There were no observed  complications post cath. Radial cath site was re-evaluated prior to discharge and found to be stable without any complications. Instructions/precautions regarding cath site care were given prior to discharge.  SHAHIRA LINGG was seen by Dr. Herbie Baltimore and determined stable for discharge home. Follow up with our office has been arranged. Medications are listed below. Pertinent changes include n/a.  _____________  Cath/PCI Registry Performance & Quality Measures: Aspirin prescribed? - No - allergy ADP Receptor Inhibitor (Plavix/Clopidogrel, Brilinta/Ticagrelor or Effient/Prasugrel) prescribed (includes medically managed patients)? - Yes High Intensity Statin (Lipitor 40-80mg  or Crestor 20-40mg ) prescribed? - Yes For EF <40%, was ACEI/ARB prescribed? - No - Reason:  Entresto on hold until seen in AHF clinic For EF <40%, Aldosterone Antagonist (Spironolactone or Eplerenone) prescribed? - Yes Cardiac Rehab Phase II ordered (Included Medically managed Patients)? - Yes  _____________   Discharge Vitals Blood pressure (!) 133/44, pulse 60, temperature 97.8 F (36.6 C), temperature source Oral, resp. rate 16, height 5\' 2"  (1.575 m), weight 100.2 kg, SpO2 (!) 89%.  Filed Weights   11/24/22 0705  Weight: 100.2 kg    Last Labs & Radiologic Studies    CBC No results for input(s): "WBC", "NEUTROABS", "HGB", "HCT", "MCV", "PLT" in the last 72 hours. Basic Metabolic Panel No results for input(s): "NA", "K", "CL", "CO2", "GLUCOSE", "BUN", "CREATININE", "CALCIUM", "MG", "PHOS" in the last 72 hours. Liver Function Tests No results for input(s): "AST", "ALT", "ALKPHOS", "BILITOT", "PROT", "ALBUMIN" in the last 72 hours. No results for input(s): "LIPASE", "AMYLASE" in the last 72 hours. High Sensitivity Troponin:   No results for input(s): "TROPONINIHS" in the last 720 hours.  BNP Invalid input(s): "POCBNP" D-Dimer No results for input(s): "DDIMER" in the last 72 hours. Hemoglobin A1C No results for  input(s): "HGBA1C" in the last 72 hours. Fasting Lipid Panel No results for input(s): "CHOL", "HDL", "LDLCALC", "TRIG", "CHOLHDL", "LDLDIRECT" in the last 72 hours. Thyroid Function Tests No results for input(s): "TSH", "T4TOTAL", "T3FREE", "THYROIDAB" in the last 72 hours.  Invalid input(s): "FREET3" _____________  CARDIAC CATHETERIZATION  Result Date: 11/24/2022   Ost RCA to Prox RCA lesion is 50% stenosed.  Prox RCA lesion is 99% stenosed.  Prox RCA to Mid RCA lesion is 45% stenosed.   Scoring balloon angioplasty was performed using a BALLN SCOREFLEX 3.0X10 => 3.5 x 15, post-dilated with BALLN [place] EMERGE MR 3.75X12.   Post intervention, there is a 20% residual stenosis in the Ost & Prox segments, and 10% residual stenosis in the Prox-Mid RCA segment.   Mid RCA lesion is 70% stenosed.   Scoring balloon  angioplasty was performed using a BALLN SCOREFLEX 3.0X10; Post intervention, there is a 30% residual stenosis.   Dist RCA stent is 40% stenosed.   ------------------   LCA & Grafts were not Injected POST-CATH FINDINGS Successful scoring balloon and high-pressure [place] balloon PTCA of diffuse in-stent restenosis of ostial to distal RCA stents reducing stenosis to 30% distally and 20% proximally. Good expansion with TIMI-3 flow. RECOMMENDATIONS She should follow-up with Dr. Shirlee Latch as scheduled.  Pain we do continue to titrate up her CHF medications.   In the absence of any other complications or medical issues, we expect the patient to be ready for discharge from an interventional cardiology perspective on 11/24/2022.   Recommend to resume Apixaban, at currently prescribed dose and frequency on 11/24/2022.   Recommend concurrent antiplatelet therapy of Clopidogrel 75mg  daily for 12 months.   If able to tolerate combination of epidural and apixaban, would consider continuing longer-term Plavix given her aspirin allergy. Bryan Lemma, MD   Disposition   Pt is being discharged home today in good  condition.  Follow-up Plans & Appointments     Discharge Instructions     Amb Referral to Cardiac Rehabilitation   Complete by: As directed    Diagnosis: PTCA   After initial evaluation and assessments completed: Virtual Based Care may be provided alone or in conjunction with Phase 2 Cardiac Rehab based on patient barriers.: Yes   Intensive Cardiac Rehabilitation (ICR) [facility] location only OR Traditional Cardiac Rehabilitation (TCR) *If criteria for ICR are not met will enroll in TCR [place] only): Yes        Discharge Medications   Allergies as of 11/24/2022       Reactions   Amoxicillin Shortness Of Breath, Rash   Brilinta [ticagrelor] Shortness Of Breath   Erythromycin Shortness Of Breath, Other (See Comments), Hives   Trouble swallowing   Flagyl [metronidazole] Shortness Of Breath, Palpitations   Penicillins Hives, Shortness Of Breath, Rash, Other (See Comments)   Has patient had a PCN reaction causing immediate rash, facial/tongue/throat swelling, SOB or lightheadedness with hypotension: Yes Has patient had a PCN reaction causing severe rash involving mucus membranes or skin necrosis: No Has patient had a PCN reaction that required hospitalization: Yes Has patient had a PCN reaction occurring within the last 10 years: No If all of the above answers are "NO", then may proceed with Cephalosporin use.   Isosorbide Mononitrate [isosorbide Nitrate] Other (See Comments)   Joint aches, muscles hurt, difficult to walk   Jardiance [empagliflozin] Other (See Comments)   Nausea, joint aches, muscles aches   Metformin And Related Other (See Comments)   Stomach pain, cold sweats, joint pain, burred vision, dizziness   Tape Other (See Comments)   Skin pulls off with certain types Plastic tape causes skin to rip if left on for long periods of time   Cefotaxime    Other Reaction(s): Unknown        Medication List     TAKE these medications    acetaminophen 500 MG tablet Commonly  known as: TYLENOL Take 500 mg by mouth every 6 (six) hours as needed for mild pain, moderate pain, fever or headache.   albuterol 108 (90 Base) MCG/ACT inhaler Commonly known as: VENTOLIN HFA Inhale 2 puffs into the lungs every 4 (four) hours as needed for wheezing or shortness of breath.   ALPRAZolam 0.5 MG tablet Commonly known as: XANAX Take 0.5-1 mg by mouth See admin instructions. Take 0.5 mg  morning at lunch  and 1 mg at bedtime   amLODipine 5 MG tablet Commonly known as: NORVASC Take 1 tablet (5 mg total) by mouth daily.   apixaban 5 MG Tabs tablet Commonly known as: Eliquis Take 1 tablet (5 mg total) by mouth 2 (two) times daily.   B-complex with vitamin C tablet Take 1 tablet by mouth in the morning.   baclofen 10 MG tablet Commonly known as: LIORESAL Take 10 mg by mouth at bedtime.   bisacodyl 5 MG EC tablet Commonly known as: DULCOLAX Take 5 mg by mouth daily as needed for moderate constipation.   buPROPion 300 MG 24 hr tablet Commonly known as: WELLBUTRIN XL Take 300 mg by mouth daily.   carboxymethylcellulose 0.5 % Soln Commonly known as: REFRESH PLUS Place 1 drop into both eyes in the morning, at noon, and at bedtime.   clopidogrel 75 MG tablet Commonly known as: PLAVIX Take 75 mg by mouth every morning.   cyanocobalamin 1000 MCG tablet Commonly known as: VITAMIN B12 Take 1,000 mcg by mouth every morning.   DULoxetine 30 MG capsule Commonly known as: CYMBALTA Take 1 capsule (30 mg total) by mouth daily.   ezetimibe 10 MG tablet Commonly known as: ZETIA Take 1 tablet (10 mg total) by mouth at bedtime.   fluocinonide cream 0.05 % Commonly known as: LIDEX Apply 1 application. topically 2 (two) times daily as needed (irritation).   Fluticasone-Salmeterol 250-50 MCG/DOSE Aepb Commonly known as: ADVAIR Inhale 1 puff into the lungs daily.   gabapentin 600 MG tablet Commonly known as: NEURONTIN Take 600-1,200 mg by mouth See admin instructions.  Take 1 tablet (600 mg) by mouth in the morning & take 2 tablets (1200 mg) by mouth at bedtime   inclisiran 284 MG/1.5ML Sosy injection Commonly known as: LEQVIO Inject 284 mg into the skin every 6 (six) months.   insulin lispro 100 UNIT/ML KwikPen Commonly known as: HUMALOG Inject 4-6 Units into the skin See admin instructions. Sliding scale Inject 8 units subcutaneously prior to breakfast and supper; add 4 units for CBG >200   Lantus SoloStar 100 UNIT/ML Solostar Pen Generic drug: insulin glargine Inject 0-8 Units into the skin at bedtime. Per sliding scale   loperamide 2 MG tablet Commonly known as: IMODIUM A-D Take 2 mg by mouth 4 (four) times daily as needed for diarrhea or loose stools.   multivitamin with minerals Tabs tablet Take 1 tablet by mouth every morning. Centrum - Women over 50   nitroGLYCERIN 0.3 MG SL tablet Commonly known as: NITROSTAT DISSOLVE 1 TABLET UNDER TONGUE EVERY 5 MINUTES FOR UP TO 3 DOSES AS NEEDED FOR CHEST PAIN   Ocuvite Eye Health Formula Caps Take 1 capsule by mouth every morning.   ondansetron 4 MG disintegrating tablet Commonly known as: ZOFRAN-ODT Take 4 mg by mouth 2 (two) times daily.   OXYGEN Inhale 2 L into the lungs at bedtime. Additional during the day   Ozempic (2 MG/DOSE) 8 MG/3ML Sopn Generic drug: Semaglutide (2 MG/DOSE) Inject 2 mg into the skin once a week.   pantoprazole 40 MG tablet Commonly known as: PROTONIX Take 1 tablet (40 mg total) by mouth daily.   Prolia 60 MG/ML Sosy injection Generic drug: denosumab Inject 60 mg into the skin every 6 (six) months.   pyridOXINE 100 MG tablet Commonly known as: VITAMIN B6 Take 100 mg by mouth every morning.   ranolazine 1000 MG SR tablet Commonly known as: RANEXA TAKE ONE TABLET BY MOUTH TWICE DAILY  rosuvastatin 40 MG tablet Commonly known as: CRESTOR Take 1 tablet (40 mg total) by mouth daily.   spironolactone 25 MG tablet Commonly known as: ALDACTONE Take 0.5  tablets (12.5 mg total) by mouth daily.   traZODone 50 MG tablet Commonly known as: DESYREL Take 100-150 mg by mouth at bedtime.   Vitamin D3 50 MCG (2000 UT) capsule Take 2,000 Units by mouth daily in the afternoon.   zolpidem 10 MG tablet Commonly known as: AMBIEN Take 10 mg by mouth at bedtime.           Allergies Allergies  Allergen Reactions   Amoxicillin Shortness Of Breath and Rash   Brilinta [Ticagrelor] Shortness Of Breath   Erythromycin Shortness Of Breath, Other (See Comments) and Hives    Trouble swallowing   Flagyl [Metronidazole] Shortness Of Breath and Palpitations   Penicillins Hives, Shortness Of Breath, Rash and Other (See Comments)    Has patient had a PCN reaction causing immediate rash, facial/tongue/throat swelling, SOB or lightheadedness with hypotension: Yes Has patient had a PCN reaction causing severe rash involving mucus membranes or skin necrosis: No Has patient had a PCN reaction that required hospitalization: Yes Has patient had a PCN reaction occurring within the last 10 years: No If all of the above answers are "NO", then may proceed with Cephalosporin use.    Isosorbide Mononitrate [Isosorbide Nitrate] Other (See Comments)    Joint aches, muscles hurt, difficult to walk    Jardiance [Empagliflozin] Other (See Comments)    Nausea, joint aches, muscles aches   Metformin And Related Other (See Comments)    Stomach pain, cold sweats, joint pain, burred vision, dizziness   Tape Other (See Comments)    Skin pulls off with certain types Plastic tape causes skin to rip if left on for long periods of time   Cefotaxime     Other Reaction(s): Unknown    Outstanding Labs/Studies   N/a   Duration of Discharge Encounter   Greater than 30 minutes including physician time.  Signed, Laverda Page, NP 11/24/2022, 2:00 PM

## 2022-11-24 NOTE — Progress Notes (Signed)
Discussed with husband (pt sleeping soundly) restrictions, Plavix, walking as tolerated, NTG and CRPII. Receptive, will refer to [place] CRPII. She has had multiple stents and done CRPII previously. Pt apparently is quite deconditioned recently therefore encouraged starting to walk 3 min, 3 x a day initially and building up. Pt woke up and education reviewed.  [id number] Ethelda Chick BS, ACSM-CEP 11/24/2022 1:29 PM

## 2022-11-25 ENCOUNTER — Encounter (HOSPITAL_COMMUNITY): Payer: Self-pay | Admitting: Cardiology

## 2022-11-26 ENCOUNTER — Telehealth (HOSPITAL_COMMUNITY): Payer: Self-pay

## 2022-11-26 NOTE — Telephone Encounter (Signed)
Called patient to see if she is interested in the Cardiac Rehab Program. Patient expressed interest. Explained scheduling process and went over insurance, patient verbalized understanding. Will contact patient for scheduling once f/u has been completed. 

## 2022-11-26 NOTE — Telephone Encounter (Signed)
Pt insurance is active and benefits verified through Medicare a/b Co-pay 0, DED $240/$240 met, out of pocket 0/0 met, co-insurance 20%. no pre-authorization required. Passport, 11/26/2022@11 :[id number], REF# [id number]  2ndary insurance is active and benefits verified through [place]. Co-pay 0, DED 0/0 met, out of pocket 0/0 met, co-insurance 0%. No pre-authorization required.  How many CR sessions are covered? (for ICR)72 Is this a lifetime maximum or an annual maximum? lifetime Has the member used any of these services to date? 35 Is there a time limit (weeks/months) on start of program and/or program completion? no   Will contact patient to see if she is interested in the Cardiac Rehab Program. If interested, patient will need to complete follow up appt. Once completed, patient will be contacted for scheduling upon review by the RN Navigator.

## 2022-11-29 NOTE — Progress Notes (Deleted)
Cardiology Office Note   Date:  11/29/2022  ID:  [name] [name], DOB 08-09-1945, [name] [id number] PCP:  Mosetta Putt, MD [facility] Cardiologist: Bryan Lemma, MD  Reason for visit: Follow-up yeah I  History of Present Illness    [name] [name] is a 77 y.o. female with a hx of  CAD-CABG x 5 (LIMA-LAD, SVG-D1, SeqSVG-OM1-OM 2, SVG-RCA with known occlusion of SVG-RCA status post CTO PCI of RCA in 2015 and 4 overlapping DES), ischemic cardiomyopathy, hypertension, dyslipidemia, paroxysmal atrial fibrillation.  She was last seen by Dr. Herbie Baltimore on November 15, 2022 to discuss PCI.  Patient had worsening EF and persistent chest pain.  Staged PCI was delayed because of AKI and hematoma post L/RHC on 10/22/22.  On November 24, 2022 she underwent outpatient balloon PTCA of diffuse in-stent restenosis of ostial to distal RCA stents reducing stenosis to 30% distally and 20% proximally.    Today, ***  *Cardiac rehab  Coronary artery disease -*** -Plan for continuation of long-term Plavix (given aspirin allergy), along with Eliquis.    Ischemic cardiomyopathy Chronic systolic and diastolic heart failure -Cardiomems implanted 2019 -She has HF follow-up appt today -***  Atrial fibrillation -Currently off amiodarone " due to potential lung issues" -Continue Eliquis for stroke prevention.  Hypertension -*** -Goal BP is <130/80.  Recommend DASH diet (high in vegetables, fruits, low-fat dairy products, whole grains, poultry, fish, and nuts and low in sweets, sugar-sweetened beverages, and red meats), salt restriction and increase physical activity.  Hyperlipidemia -*** -Recommend cholesterol lowering diets - Mediterranean diet, DASH diet, vegetarian diet, low-carbohydrate diet and avoidance of trans fats.  Discussed healthier choice substitutes.  Nuts, high-fiber foods, and fiber supplements may also improve lipids.    Obesity -Even a 5-10% weight loss can have cardiovascular  benefits.   -Recommend moderate intensity activity for 30 minutes 5 days/week and the DASH diet.  Disposition - Follow-up in ***     Objective / Physical Exam   EKG today: ***  Vital signs:  There were no vitals taken for this visit.    GEN: No acute distress NECK: No carotid bruits CARDIAC: ***RRR, no murmurs RESPIRATORY:  Clear to auscultation without rales, wheezing or rhonchi  EXTREMITIES: No edema  Assessment and Plan   ***   {Are you ordering a CV Procedure (e.g. stress test, cath, DCCV, TEE, etc)?   Press F2        :[id number]}    Signed, Bernette Mayers  11/29/2022 [facility]

## 2022-11-30 ENCOUNTER — Telehealth: Payer: Self-pay | Admitting: Physician Assistant

## 2022-11-30 ENCOUNTER — Telehealth (HOSPITAL_COMMUNITY): Payer: Self-pay | Admitting: Cardiology

## 2022-11-30 NOTE — Telephone Encounter (Signed)
Verbal order given to begin [facility] services through [facility]

## 2022-11-30 NOTE — Telephone Encounter (Signed)
Spoke to patient she stated she has 2 appointments 10/30.She has post hospital  appointment with Juanda Crumble PA and post hospital appointment with Heart Failure clinic.Appointment with Juanda Crumble PA cancelled.Advised to keep appointment with Heart Failure clinic at 10:30 am.

## 2022-11-30 NOTE — Telephone Encounter (Signed)
Patient called and want to know if appt with Juanda Crumble is needed due to her seeing the heart failure doctor a couple hours after.

## 2022-12-01 ENCOUNTER — Ambulatory Visit (HOSPITAL_COMMUNITY)
Admission: RE | Admit: 2022-12-01 | Discharge: 2022-12-01 | Disposition: A | Discharge status: Home / Self Care | Payer: Medicare Other | Source: Ambulatory Visit | Attending: Cardiology

## 2022-12-01 ENCOUNTER — Encounter (HOSPITAL_COMMUNITY): Payer: Self-pay

## 2022-12-01 ENCOUNTER — Ambulatory Visit: Payer: Medicare Other | Admitting: Physician Assistant

## 2022-12-01 VITALS — BP 146/78 | HR 61 | Wt 219.6 lb

## 2022-12-01 DIAGNOSIS — I080 Rheumatic disorders of both mitral and aortic valves: Secondary | ICD-10-CM | POA: Insufficient documentation

## 2022-12-01 DIAGNOSIS — G4733 Obstructive sleep apnea (adult) (pediatric): Secondary | ICD-10-CM | POA: Diagnosis not present

## 2022-12-01 DIAGNOSIS — I651 Occlusion and stenosis of basilar artery: Secondary | ICD-10-CM | POA: Diagnosis not present

## 2022-12-01 DIAGNOSIS — R001 Bradycardia, unspecified: Secondary | ICD-10-CM | POA: Insufficient documentation

## 2022-12-01 DIAGNOSIS — Z951 Presence of aortocoronary bypass graft: Secondary | ICD-10-CM | POA: Diagnosis not present

## 2022-12-01 DIAGNOSIS — I130 Hypertensive heart and chronic kidney disease with heart failure and stage 1 through stage 4 chronic kidney disease, or unspecified chronic kidney disease: Secondary | ICD-10-CM | POA: Diagnosis not present

## 2022-12-01 DIAGNOSIS — D693 Immune thrombocytopenic purpura: Secondary | ICD-10-CM | POA: Insufficient documentation

## 2022-12-01 DIAGNOSIS — Z9861 Coronary angioplasty status: Secondary | ICD-10-CM

## 2022-12-01 DIAGNOSIS — I25119 Atherosclerotic heart disease of native coronary artery with unspecified angina pectoris: Secondary | ICD-10-CM | POA: Diagnosis not present

## 2022-12-01 DIAGNOSIS — Z7902 Long term (current) use of antithrombotics/antiplatelets: Secondary | ICD-10-CM | POA: Insufficient documentation

## 2022-12-01 DIAGNOSIS — I2510 Atherosclerotic heart disease of native coronary artery without angina pectoris: Secondary | ICD-10-CM | POA: Diagnosis not present

## 2022-12-01 DIAGNOSIS — Z6841 Body Mass Index (BMI) 40.0 and over, adult: Secondary | ICD-10-CM | POA: Insufficient documentation

## 2022-12-01 DIAGNOSIS — Z9981 Dependence on supplemental oxygen: Secondary | ICD-10-CM | POA: Diagnosis not present

## 2022-12-01 DIAGNOSIS — I25708 Atherosclerosis of coronary artery bypass graft(s), unspecified, with other forms of angina pectoris: Secondary | ICD-10-CM | POA: Diagnosis not present

## 2022-12-01 DIAGNOSIS — Z7901 Long term (current) use of anticoagulants: Secondary | ICD-10-CM | POA: Insufficient documentation

## 2022-12-01 DIAGNOSIS — N189 Chronic kidney disease, unspecified: Secondary | ICD-10-CM | POA: Diagnosis not present

## 2022-12-01 DIAGNOSIS — I5022 Chronic systolic (congestive) heart failure: Secondary | ICD-10-CM

## 2022-12-01 DIAGNOSIS — Z8673 Personal history of transient ischemic attack (TIA), and cerebral infarction without residual deficits: Secondary | ICD-10-CM | POA: Insufficient documentation

## 2022-12-01 DIAGNOSIS — I5042 Chronic combined systolic (congestive) and diastolic (congestive) heart failure: Secondary | ICD-10-CM | POA: Diagnosis not present

## 2022-12-01 DIAGNOSIS — Z95828 Presence of other vascular implants and grafts: Secondary | ICD-10-CM | POA: Insufficient documentation

## 2022-12-01 DIAGNOSIS — Z7985 Long-term (current) use of injectable non-insulin antidiabetic drugs: Secondary | ICD-10-CM | POA: Insufficient documentation

## 2022-12-01 DIAGNOSIS — I480 Paroxysmal atrial fibrillation: Secondary | ICD-10-CM | POA: Diagnosis not present

## 2022-12-01 DIAGNOSIS — Z955 Presence of coronary angioplasty implant and graft: Secondary | ICD-10-CM | POA: Diagnosis not present

## 2022-12-01 DIAGNOSIS — E785 Hyperlipidemia, unspecified: Secondary | ICD-10-CM | POA: Diagnosis not present

## 2022-12-01 DIAGNOSIS — I447 Left bundle-branch block, unspecified: Secondary | ICD-10-CM | POA: Diagnosis not present

## 2022-12-01 DIAGNOSIS — Z79899 Other long term (current) drug therapy: Secondary | ICD-10-CM | POA: Insufficient documentation

## 2022-12-01 DIAGNOSIS — R531 Weakness: Secondary | ICD-10-CM | POA: Insufficient documentation

## 2022-12-01 LAB — BASIC METABOLIC PANEL
Anion gap: 10 (ref 5–15)
BUN: 13 mg/dL (ref 8–23)
CO2: 29 mmol/L (ref 22–32)
Calcium: 9.2 mg/dL (ref 8.9–10.3)
Chloride: 101 mmol/L (ref 98–111)
Creatinine, Ser: 0.92 mg/dL (ref 0.44–1.00)
GFR, Estimated: >60 mL/min (ref >60)
Glucose, Bld: 137 mg/dL — ABNORMAL HIGH (ref 70–99)
Potassium: 4.3 mmol/L (ref 3.5–5.1)
Sodium: 140 mmol/L (ref 135–145)

## 2022-12-01 LAB — BRAIN NATRIURETIC PEPTIDE: B Natriuretic Peptide: 280.6 pg/mL — ABNORMAL HIGH (ref 0.0–100.0)

## 2022-12-01 MED ORDER — RANOLAZINE ER 1000 MG PO TB12: 1000.0000 mg | ORAL_TABLET | Freq: Two times a day (BID) | ORAL | 3 refills | Status: DC

## 2022-12-01 MED ORDER — ENTRESTO 24-26 MG PO TABS: 1.0000 | ORAL_TABLET | Freq: Two times a day (BID) | ORAL | Status: DC

## 2022-12-01 NOTE — Patient Instructions (Signed)
Labs done today. We will contact you only if your labs are abnormal.  RESTART Entresto 24-26mg  (1 tablet) by mouth 2 times daily.   No other medication changes were made. Please continue all current medications as prescribed.  Your physician recommends that you schedule a follow-up appointment in: 2-3 weeks for an appointment with our Clinic Pharmacist and in 3 months with Dr. Shirlee Latch with an echo prior to your appointment. Please contact our office in December to schedule a January 2025 appointment.   Your physician has requested that you have an echocardiogram. Echocardiography is a painless test that uses sound waves to create images of your heart. It provides your doctor with information about the size and shape of your heart and how well your heart's chambers and valves are working. This procedure takes approximately one hour. There are no restrictions for this procedure. Please do NOT wear cologne, perfume, aftershave, or lotions (deodorant is allowed). Please arrive 15 minutes prior to your appointment time.  If you have any questions or concerns before your next appointment please send [place] a message through [facility] or call our office at [phone].    TO LEAVE A MESSAGE FOR THE NURSE SELECT OPTION 2, PLEASE LEAVE A MESSAGE INCLUDING: YOUR NAME DATE OF BIRTH CALL BACK NUMBER REASON FOR CALL**this is important as we prioritize the call backs  YOU WILL RECEIVE A CALL BACK THE SAME DAY AS LONG AS YOU CALL BEFORE 4:00 PM   Do the following things EVERYDAY: Weigh yourself in the morning before breakfast. Write it down and keep it in a log. Take your medicines as prescribed Eat low salt foods--Limit salt (sodium) to 2000 mg per day.  Stay as active as you can everyday Limit all fluids for the day to less than 2 liters   At [facility], you and your health needs are our priority. As part of our continuing mission to provide you with exceptional heart care, we have  created designated Provider Care Teams. These Care Teams include your primary Cardiologist (physician) and Advanced Practice Providers (APPs- Physician Assistants and Nurse Practitioners) who all work together to provide you with the care you need, when you need it.   You may see any of the following providers on your designated Care Team at your next follow up: Dr Arvilla Meres Dr Marca Ancona Dr. Marcos Eke, NP Robbie Lis, [place] [facility] [place], [place] Brynda Peon, NP Karle Plumber, PharmD   Please be sure to bring in all your medications bottles to every appointment.    Thank you for choosing [facility]

## 2022-12-01 NOTE — Progress Notes (Addendum)
Date:  12/01/2022   ID:  [name], DOB September 16, 1945, [name] [id number]   Provider location: [facility] Type of Visit: Established [name] [name]   PCP:  Mosetta Putt, MD  HF Cardiologist:  Dr. Shirlee Latch  Chief Complaint: Follow up for CAD and CHF.   History of Present Illness: [name] [name] is a 77 y.o. female who has a history of CAD s/p CABG, diastolic CHF, and cerebrovascular disease with history of CVA presents for followup of CHF and diastolic CHF. She had CABG x 5 in 12/11.  Prior to the CABG she had multiple PCIs.  She had a CVA in 2/14 that presented as visual loss.  She had a left carotid stent in 2/15.    Left heart cath done in Sept. 2014. This showed patent SVG-D, LIMA-LAD, and sequential SVG-OM branches but SVG-RCA and the native RCA were both totally occluded. It was felt that her increased symptoms coincided with occlusion of SVG-RCA. She was started on Imdur to see if this would help with the chest pain and dyspnea. However, she feels like Imdur causes leg cramps and does not think that she can take it. So ranolazine 500 mg bid started and titrated this up to 1000 mg bid.  This helped some but not markedly. Therefore, sent to Dr. Eldridge Dace to address opening RCA CTO. He was able to do this in 5/15 with 4 overlapping Xience DES.  This led to resolution of exertional chest pain.     [name] [name] was started on Brilinta after PCI.  She became much more short of breath after starting on Brilinta. Brilinta stopped and replaced with Plavix.  Dyspnea improved off Brilinta. Given increasing exertional dyspnea and chest pain,  RHC/LHC was done in 4/17.  This showed stable CAD with no interventional target.  Left and right heart filling pressures were not significantly increased.  Medical management.    She had an MRI/MRA in May 2018 that showed moderately severe stenosis of the supraclinoid segment of the right internal carotid artery, progressed since the 2016 MR  angiogram, and severe basilar artery stenosis.    She developed recurrent exertional chest pain and had LHC in 6/18, showing 4/5 grafts patent with patent native RCA (similar to prior cath, no changes).     Echo 1/19 with EF 55-60%, moderate diastolic dysfunction, PASP 50 mmHg, mildly dilated RV, mild AS, mild-moderate MR.    She reported increased dyspnea and chest pain and had RHC/LHC in 9/19.  She had DES to sequential SVG-OM1/PLOM.  While hospitalized, she was noted to have runs of atrial fibrillation.  She was very symptomatic with the atrial fibrillation.  Eliquis and amiodarone were started.    She was admitted 12/16-12/19/19 for cardiomems implant and RHC. She was diuresed with IV lasix and transitioned to torsemide 100 mg BID + metolazone once/week. DC weight: 295 lbs.   She had a prolonged hospitalization in 2/20 with acute respiratory failure and fever to 103.  She was thought to have PNA and treated with cefepime.  She was intubated.  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  Amiodarone was stopped and she was put on prednisone. Echo in 2/20 showed EF down to 25-30% with mid-apical LV severe hypokinesis. She had AKI. Possible ITP triggered by infection, HIT negative.  ITP was treated with Solumedrol. She was discharged to SNF.    ORIF left ankle 4/20.   11/20 admission initially with concern for TIAs (staring spells),  ended up thinking possible partial seizures.  Head MRI did not show CVA.  Echo in 11/20 showed EF 45-50%, moderately decreased RV systolic function, moderate RV enlargement, moderate-severe MR, moderate TR.   [name] [name] was admitted again in 12/20 with AKI and hypotension in the setting of sinus bradycardia (HR 30s).  She was volume overloaded on exam.  Toprol XL was stopped and HR increased to 60s.  She was diuresed with IV Lasix.  TEE in 12/20 showed EF 50-55%, septal-lateral dyssynchrony, mildly decreased RV function, moderate MR.   Atypical  chest pain in 1/21, Cardiolite showed EF 57%, fixed inferior defect (likely artifact), no ischemia.   Echo in 5/22 showed EF 60-65%, moderate LVH, normal RV, PASP 37 mmHg, 2.5 x 2.1 cm calcified mass posterior mitral annulus with mild-moderate MR => ?severe MAC but more circumscribed and prominent than in the past, mild AS mean gradient 12 mmHg and AVA 1.83 cm^2.  Echo in 11/22 showed EF 55%, normal RV, mass posterior mitral annulus, moderate MR, mild AS, IVC normal.  Cardiac MRI was done in 1/23 to investigate posterior mitral annulus mass; study showed LV EF 48%, mild LV dilation, normal RV with EF 53%, extensive MAC is likely the source of the posterior mitral annulus mass, basal-mid inferior >50% subendocardial LGE suggestive of prior inferior MI.   Follow up 2/23 she had chest tightness x 4 weeks. Arranged for [facility] which showed in-stent restenosis of pRCA, felt to be the source of her symptoms given occluded SVG-RCA. Underwent PTCA to pRCA. RHC with normal PCWP and RA pressure, mild pulmonary hypertension, preserved CO. On ASA + Plavix + Eliquis x 1 month (until 05/21/21), then Plavix + Eliquis.   Echo 8/24 showed EF significantly worse, down to 30-35%, G1DD, normal RV.   Admitted after elective L/RHC after she reported worsening exertional dyspnea and chest tightness.  LHC/RHC showed mildly elevated PCWP with normal RA pressure, mixed pulmonary venous/pulmonary arterial hypertension, and 99% in-stent restenosis in the proximal RCA, at least 2 layers of stent are present. Admitted for cutting balloon angioplasty to ISR in the RCA. Stay complicated by AKI and acute hematoma at cath site with hypotension. Decision was made to defer staged PCI and continue medical therapy for short term.  Seen in the AHF office on 10/4 and continued to have episodes of chest pain. Referred back to Dr. Herbie Baltimore to discussed staged intervention. She presented back on 10/23 and underwent successful scoring balloon and  high-pressure 9 compliant balloon PTCA of diffuse in-stent restenosis of ostial to distal RCA stents reducing stenosis to 30% distally and 20% proximally. Continued on Eliquis + Plavix.   She presents back to [facility] today for post hospital f/u. Overall feels much improved since PCI. Chest pain and breathing much better. Denies resting dyspnea. C/w stable NYHA Class III symptoms confounded by obesity and deconditioning. Her wt is down 7 lb since last visit. Only using torsemide PRN. Has not used her cardiomems in the last wk. Her main concern is hypertension. SBP 140 in clinic today, has been as high as 180s at home. She continues w/ occasional dizziness which has been a chronic issue for her but denies syncope/near syncope.     ECG (personally reviewed): SB 58 , LBBB QRS 170 ms    Labs (1/19): K 4.9, creatinine 1.22 Labs (9/19): LDL 108 Labs (10/19): hgb 64.4, K 4.3, creatinine 1.03 Labs 12/06/2017: K 4.7 Creatinine 1.7 Labs (3/20): K 4.8, creatinine 1.39, hgb 12.4 plts 226 Labs (11/20): K 4.2,  creatinine 1.01, LDL 38 Labs (12/20): K 3.5, creatinine 1.8 Labs (3/21): K 4.5, creatinine 1.5 Labs (2/22): K 4.2, creatinine 1.13 Labs (4/22): LDL 58 Labs (9/22): K 4.3, creatinine 1.27 Labs (12/22): K 4.4, creatinine 1.13 Labs (3/23): K 4.3, creatinine 1.02 Labs (5/23): K 4.3, creatinine 1.33 Labs (6/23): BNP 140, K 4.5, creatinine 0.98 Labs (10/23): K 4.2, creatinine 0.86 Labs (12/23): K 4.4, creatinine 0.90, LDL 111 Labs (3/24): BNP 163, K 4.7, creatinine 0.9 Labs (7/24): K 4.9, creatinine 0.95, LDL 83 Labs (9/24): K 4.2, SCr 1.16, LDL 25 Labs (10/24): K 4.3, SCr 1.0    PMH: 1. Diabetic gastroparesis 2. Type II diabetes 3. HTN 4. Morbid obesity 5. CAD: s/p CABG in 12/11 after prior PCIs.  LIMA-LAD, SVG-D, seq SVG-OM1 and OM2, SVG-PDA. Adenosine Cardiolite (8/14) with EF 53% and a small reversible apical defect with a medium, partially reversible inferior defect.  LHC (9/14) with patent  SVG-D, LIMA-LAD (50% distal LAD), and sequential SVG-OM branches; the SVG-RCA and the native RCA were occluded.  This was managed medically initially, but with ongoing exertional chest pain, it was decided to open CTO.  [name] [name] had CTO opening with 4 overlapping Xience DES in the RCA in 5/15.   - LHC (4/17): SVG-D patent, LIMA-LAD patent, distal LAD with several 40-50% stenoses, sequential SVG-OM1 and PLOM patent with 50% proximal stenosis (not flow limiting), SVG-RCA TO with patent RCA stents.  - LHC (6/18): 4/5 grafts patent (SVG-RCA TO, same as past), RCA stents patent => no change.  - LHC (9/19): Sequential SVG-PLOM/OM1 with 80% proximal stenosis, s/p DES.  - Cardiolite (1/21): EF 57%, fixed inferior defect (likely artifact), no ischemia.  - LHC (3/23): severe CAD with 90% in-stent restenosis of pRCA, s/p PTCA to in-stent restenosis of pRCA -  LHC 9/24  99% in-stent restenosis in the proximal RCA, at least 2 layers of stent are present  6. Atypical migraines 7. OHS/OSA: Intolerant of CPAP. Uses oxygen with exertion and at night.  8. GERD with hiatal hernia 9. OA 10. HFpEF: She has a Cardiomems device.  Echo (11/13) with EF 50-55%, grade II diastolic dysfunction, mild-moderate MR.  Echo (8/14) with EF 55-60%, grade II diastolic dysfunction, mildly increased aortic valve gradient (mean 12 mmHg) but valve opens well, mild MR and mild RV dilation.  Echo (9/15) with EF 60-65%, grade II diastolic dysfunction, mild aortic stenosis, mild mitral stenosis, mild to moderate mitral regurgitation, mildly dilated RV with normal systolic function, PA systolic pressure 46 mmHg.  - RHC (4/17): mean RA 9, PA 38/15 mean 27, mean PCWP 9, CI 2.5.  - Echo (5/17): EF 55-60%, mild LVH, mildly dilated RV with low normal systolic function, moderate TR, PASP 61 mmHg - Echo (1/19): EF 55-60%, moderate diastolic dysfunction, PASP 50 mmHg, mildly dilated RV, mild AS, mild-moderate MR. - RHC (9/19): mean RA 11, PA 34/12, mean  PCWP 15, CI 2.85 - Echo (3/20): EF 25-30% with mid-apical severe hypokinesis.  - Echo (11/20): EF 45-50%, moderately dilated RV with moderately decreased systolic function, moderate-severe MR, moderate TR.  - TEE (12/20): EF 50-55%, septal-lateral dyssynchrony, moderate RV dilation/mildly decreased function, peak RV-RA gradient 51 mmHg, moderate MR.  - Echo (5/22): EF 60-65%, moderate LVH, normal RV, PASP 37 mmHg, 2.5 x 2.1 cm calcified mass posterior mitral annulus with mild-moderate MR => ?severe MAC but more circumscribed and prominent than in the past, mild AS mean gradient 12 mmHg and AVA 1.83 cm^2.  - Echo (11/22): EF 55%, normal RV,  mass posterior mitral annulus, moderate MR, mild AS, IVC normal. - Cardiac MRI in 1/23 showed LV EF 48%, mild LV dilation, normal RV with EF 53%, extensive MAC is likely the source of the posterior mitral annulus mass, basal-mid inferior >50% subendocardial LGE suggestive of prior inferior MI.  - RHC (3/23): normal PCWP (14) and RA pressure (2) , mild pulmonary hypertension, preserved CO/CI (6.51/3.28). - Echo (7/24): EF 30-35%, G1DD, normal RV.  -  LHC/RHC 9/24 showed mildly elevated PCWP with normal RA pressure, mixed pulmonary venous/pulmonary arterial hypertension, and 99% in-stent restenosis in the proximal RCA, at least 2 layers of stent are present  11. CKD 12. Chronic LBBB 13. Anxiety 14. Carotid stenosis: Followed by [facility], >80% LICA stenosis 12/14.  She had left carotid stent in 2/15. Carotid dopplers 8/15 with no significant disease. Carotid dopplers (4/16): < 40% RICA stenosis.  - Carotid dopplers (9/22): 1-39% BICA stenosis.  15. Cerebrovascular disease: CVA 2/14 with right posterior cerebral artery territory ischemic infarction. Cerebral angiogram in 6/14 showed 70% right vertebral ostial stenosis, 60-65% LICA stenosis, > 70% proximal left posterior cerebral artery stenosis, posterior communicating artery aneurysm.  [name] [name] has had episodes of transient  expressive aphasia.  Carotid dopplers (12/14) showed >80% LICA stenosis. She had a left carotid stent 2/15.  Possible CVA in 7/15. - MRI/MRA in May 2018 that showed moderately severe stenosis of the supraclinoid segment of the right internal carotid artery, progressed since the 2016 MR angiogram and severe basilar artery stenosis.  16. Positional vertigo (suspected) 17. Palpitations: Holter (6/15) with rare PVCs/PACs.  - Event monitor (2/18): NSR, occasional PVCs - 14 day Zio (9/23) mostly NSR, rare PVCs and PACs, few short runs of SVT, no atrial fibrillation or worrisome arrhythmias. 18. Dyspnea with Brilinta. 19. Melanoma: On face, s/p excision.   20. Aortic stenosis: Mild on 11/22 echo.  21. Posterior mitral annular mass: Cardiac MRI was done in 1/23 to investigate posterior mitral annulus mass; study showed LV EF 48%, mild LV dilation, normal RV with EF 53%, extensive MAC is likely the source of the posterior mitral annulus mass, basal-mid inferior >50% subendocardial LGE suggestive of prior inferior MI.  22. Lower extremity arterial dopplers (9/16) were normal.  23. Sleep study (4/18): No significant OSA.  24. Atrial fibrillation: Paroxysmal - Amiodarone stopped, ?lung toxicity.  25. H/o ITP in 3/20.  26. Partial seizures 27. Mitral regurgitation: Moderate on TEE in 12/20.  - Mild to moderate on 5/22 echo.  - Moderate on 11/22 echo  Current Outpatient Medications  Medication Sig Dispense Refill   acetaminophen (TYLENOL) 500 MG tablet Take 500 mg by mouth every 6 (six) hours as needed for mild pain, moderate pain, fever or headache.     albuterol (VENTOLIN HFA) 108 (90 Base) MCG/ACT inhaler Inhale 2 puffs into the lungs every 4 (four) hours as needed for wheezing or shortness of breath.      ALPRAZolam (XANAX) 0.5 MG tablet Take 0.5-1 mg by mouth See admin instructions. Take 0.5 mg  morning at lunch and 1 mg at bedtime     amLODipine (NORVASC) 5 MG tablet Take 1 tablet (5 mg total) by  mouth daily.     apixaban (ELIQUIS) 5 MG TABS tablet Take 1 tablet (5 mg total) by mouth 2 (two) times daily. 180 tablet 3   B Complex-C (B-COMPLEX WITH VITAMIN C) tablet Take 1 tablet by mouth in the morning.     baclofen (LIORESAL) 10 MG tablet Take 10 mg by mouth  at bedtime.     bisacodyl (DULCOLAX) 5 MG EC tablet Take 5 mg by mouth daily as needed for moderate constipation.     buPROPion (WELLBUTRIN XL) 300 MG 24 hr tablet Take 300 mg by mouth daily.     carboxymethylcellulose (REFRESH PLUS) 0.5 % SOLN Place 1 drop into both eyes in the morning, at noon, and at bedtime.     Cholecalciferol (VITAMIN D3) 50 MCG (2000 UT) capsule Take 2,000 Units by mouth daily in the afternoon.     clopidogrel (PLAVIX) 75 MG tablet Take 75 mg by mouth every morning.     denosumab (PROLIA) 60 MG/ML SOSY injection Inject 60 mg into the skin every 6 (six) months.     DULoxetine (CYMBALTA) 30 MG capsule Take 1 capsule (30 mg total) by mouth daily. 30 capsule 0   ezetimibe (ZETIA) 10 MG tablet Take 1 tablet (10 mg total) by mouth at bedtime. 90 tablet 3   fluocinonide cream (LIDEX) 0.05 % Apply 1 application. topically 2 (two) times daily as needed (irritation).     Fluticasone-Salmeterol (ADVAIR) 250-50 MCG/DOSE AEPB Inhale 1 puff into the lungs daily.     gabapentin (NEURONTIN) 600 MG tablet Take 600-1,200 mg by mouth See admin instructions. Take 1 tablet (600 mg) by mouth in the morning & take 2 tablets (1200 mg) by mouth at bedtime     inclisiran (LEQVIO) 284 MG/1.5ML SOSY injection Inject 284 mg into the skin every 6 (six) months.     Insulin Glargine (LANTUS SOLOSTAR) 100 UNIT/ML Solostar Pen Inject 0-8 Units into the skin at bedtime. Per sliding scale     insulin lispro (HUMALOG) 100 UNIT/ML KwikPen Inject 4-6 Units into the skin See admin instructions. Sliding scale Inject 8 units subcutaneously prior to breakfast and supper; add 4 units for CBG >200     loperamide (IMODIUM A-D) 2 MG tablet Take 2 mg by mouth  4 (four) times daily as needed for diarrhea or loose stools.     Multiple Vitamin (MULTIVITAMIN WITH MINERALS) TABS tablet Take 1 tablet by mouth every morning. Centrum - Women over 50     Multiple Vitamins-Minerals (OCUVITE EYE HEALTH FORMULA) CAPS Take 1 capsule by mouth every morning.     nitroGLYCERIN (NITROSTAT) 0.3 MG SL tablet DISSOLVE 1 TABLET UNDER TONGUE EVERY 5 MINUTES FOR UP TO 3 DOSES AS NEEDED FOR CHEST PAIN 100 tablet 0   ondansetron (ZOFRAN-ODT) 4 MG disintegrating tablet Take 4 mg by mouth 2 (two) times daily.     OXYGEN Inhale 2 L into the lungs at bedtime. Additional during the day     pantoprazole (PROTONIX) 40 MG tablet Take 1 tablet (40 mg total) by mouth daily. 30 tablet 0   pyridOXINE (VITAMIN B6) 100 MG tablet Take 100 mg by mouth every morning.     ranolazine (RANEXA) 1000 MG SR tablet TAKE ONE TABLET BY MOUTH TWICE DAILY 180 tablet 3   rosuvastatin (CRESTOR) 40 MG tablet Take 1 tablet (40 mg total) by mouth daily. 90 tablet 3   Semaglutide, 2 MG/DOSE, (OZEMPIC, 2 MG/DOSE,) 8 MG/3ML SOPN Inject 2 mg into the skin once a week. 3 mL 11   spironolactone (ALDACTONE) 25 MG tablet Take 0.5 tablets (12.5 mg total) by mouth daily. 45 tablet 3   traZODone (DESYREL) 50 MG tablet Take 100-150 mg by mouth at bedtime.     vitamin B-12 (CYANOCOBALAMIN) 1000 MCG tablet Take 1,000 mcg by mouth every morning.      zolpidem (  AMBIEN) 10 MG tablet Take 10 mg by mouth at bedtime.     No current facility-administered medications for this encounter.   Allergies:   Amoxicillin, Brilinta [ticagrelor], Erythromycin, Flagyl [metronidazole], Penicillins, Isosorbide mononitrate [isosorbide nitrate], Jardiance [empagliflozin], Metformin and related, Tape, and Cefotaxime   Social History:  The [name] [name]  reports that she has never smoked. She has never used smokeless tobacco. She reports that she does not drink alcohol and does not use drugs.   Family History:  The [name] [name]'s family history includes  AAA (abdominal aortic aneurysm) in her father and mother; Cancer in her sister; Deep vein thrombosis in her father and mother; Dementia in her maternal grandmother and mother; Diabetes in her father, paternal aunt, paternal grandmother, paternal uncle, and paternal uncle; Heart attack in her father; Heart attack (age of onset: 40) in her paternal grandfather; Heart disease in her father and paternal uncle; Hyperlipidemia in her father; Hypertension in her father, mother, and sister; Stroke in her maternal grandmother, paternal grandmother, and paternal uncle.   ROS:  Please see the history of present illness.   All other systems are personally reviewed and negative.   Recent Labs: 10/22/2022: ALT 20; TSH 0.750 10/25/2022: Magnesium 2.2 11/05/2022: B Natriuretic Peptide 197.4; Hemoglobin 12.4; Platelets 209 11/12/2022: BUN 14; Creatinine, Ser 1.00; Potassium 4.3; Sodium 140  Personally reviewed   Wt Readings from Last 3 Encounters:  12/01/22 99.6 kg (219 lb 9.6 oz)  11/24/22 100.2 kg (221 lb)  11/15/22 102.9 kg (226 lb 12.8 oz)    BP (!) 146/78 (BP Location: Left Arm, [name] [name] Position: Sitting, Cuff Size: Large)   Pulse 61   Wt 99.6 kg (219 lb 9.6 oz)   SpO2 94%   BMI 40.17 kg/m   PHYSICAL EXAM: General:  Well appearing, obese in WC. No respiratory difficulty HEENT: normal Neck: supple. no JVD. Carotids 2+ bilat; no bruits. No lymphadenopathy or thyromegaly appreciated. Cor: PMI nondisplaced. Regular rate & rhythm. No rubs, gallops or murmurs. Lungs: clear Abdomen: obese, soft, nontender, nondistended. No hepatosplenomegaly. No bruits or masses. Good bowel sounds. Extremities: no cyanosis, clubbing, rash, edema + rt radial ecchymosis at recent cath access site, soft no hematoma w/ 2+radial pulse  Neuro: alert & oriented x 3, cranial nerves grossly intact. moves all 4 extremities w/o difficulty. Affect pleasant.   Assessment & Plan: 1. CAD: Occluded native RCA and SVG-RCA.  Status post  opening of CTO RCA with 4 overlapping Xience DES in 5/15.  DES to sequential SVG-OM1/PLOM in 9/19. LHC in 3/23 showed severe native coronary disease with 90% in-stent restenosis in the proximal RCA ([name] [name] has 2 layers of stent at this site). The sequential SVG-OM1/PLOM was patent, SVG-D was patent, and LIMA-LAD was patent. She underwent PTCA of pRCA. She has chronic angina, however exertional dyspnea has been more prominent. Recent echo showed EF further reduced, now 30-35%. Cath 9/24 showed 99% in-stent restenosis in the proximal RCA (known occluded SVG-RCA). Other coronary disease was stable. PCI initially was deferred during that admission given AKI which ultimately improved. Returned back to cath lab on 11/24/22 for staged intervention and underwent successful cutting balloon angioplasty to ISR in the RCA w/ only 20-30% distal residual stenosis. CP/dyspnea much improved following PCI  - Continue apixaban and Plavix (has been on chronic Plavix, neuro has wanted her to stay on this for cerebrovascular disease).   - Continue ranolazine 1000 mg bid.  - Off Imdur with headache. - Off Toprol XL with bradycardia.   2. Chronic Combined  Systolic and Diastolic CHF: Echo in 1/19 with EF 55-60%, moderate diastolic dysfunction. Cardiomems placed 01/16/18.  Echo in 3/20 in setting of severe PNA with intubation showed EF 25-30%, mid-apical LV severe hypokinesis.  Possible stress (Takotsubo-type) cardiomyopathy related to severe medical illness versus progression of CAD.  She has baseline chronic LBBB which could play a role in cardiomyopathy as well (LBBB cardiomyopathy).  Repeat echo in 11/20 with EF up to 45-50%, moderately dysfunctional RV.  TEE in 12/20 with EF 50-55% with septal-lateral dyssynchrony and mildly dysfunctional RV.  Echo in 11/22 with EF 55% with normal RV.  cMRI in 1/23 with EF 48% and evidence for prior inferior MI.  Echo 7/24 30-35%, G1DD, normal RV.  C/w ongoing NYHA class III symptoms, confounded  by obesity/deconditioning and overall much improved since undergoing PCI - appears grossly euvolemic on exam. Wt down 7 lb since last visit. Has not used Cardiomems in the last wk  - continue torsemide PRN. Check BMP/BNP today. Encouraged to restart daily cardiomems transmissions for remote monitoring  - restart Entresto 24-26 mg bid - continue spiro 12.5 mg daily  - Cannot tolerate SGLT2i. - Not on beta blocker due to bradycardia.  - With significant reduction in her EF, her LBBB is wide, 170 ms on today's EKG, and she may benefit from CRT. Will repeat echo in 3 months  3. Hyperlipidemia: She remains on Zetia, Crestor and Leqvio.  - Followed in lipid clinic. LDL 25 (9/24) 4. Hypertension: BP mildly elevated today. Also elevated at home. She has had orthostatic symptoms and has fallen in the past. We discussed options. She wants to retrial Sherryll Burger now that AKI has resolved - restart Entresto (at lower dose 24-26 bid). Check f/u BMP today  - continue amlodipine and Spiro  5. Cerebrovascular disease: She has history of CVA and had left carotid stent in 2/15. Followed by [facility].  Last MRA head showed severe stenosis of supraclinoid RICA and severe basilar artery stenosis.  - Neurology would like her to continue Plavix for this despite concomitant apixaban use.  6. OHS/OSA: Cannot tolerate CPAP.  Uses oxygen at night.  7. Atrial fibrillation: Paroxysmal. 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 No recent palpitations, NSR today.  - Continue apixaban 5 mg bid. Denies bleeding. 8. Obesity: Body mass index is 40.17 kg/m. - She is now on Ozempic. 9. Mitral regurgitation: Moderate on 11/22 echo.  - Mild to moderate on 7/24 echo.  10. Bradycardia: Now off Toprol XL.   11. Aortic stenosis: Mild on 11/22 echo. Echo 7/24 showed sclerosis w/o stenosis, mG 4 mmHg.  12. Mitral annular mass: This is a well-circumscribed calcified posterior mitral annulus mass noted on 5/22 and  11/22 echoes.  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 12. Weakness - working with home PT  F/u w/ PharmD in 2 wks to reassess BP and assist w/ med titration. Will also need f/u BMP. Follow up in 3 months with Dr. Shirlee Latch w/ repeat echo   Signed, Robbie Lis, PA-C  12/01/2022  [facility] [address] [facility] [place] [address] [phone] (office) [phone] (fax)

## 2022-12-01 NOTE — Progress Notes (Signed)
Medication Samples have been provided to the patient.  Drug name: Sherryll Burger       Strength: 24-26mg         Qty: 2  LOT: [id number]  Exp.Date: 05/2024  Dosing instructions: take 1 tab po bid  The patient has been instructed regarding the correct time, dose, and frequency of taking this medication, including desired effects and most common side effects.   Tachina Spoonemore R Gemayel Mascio 10:56 AM 12/01/2022

## 2022-12-02 ENCOUNTER — Telehealth (HOSPITAL_COMMUNITY): Payer: Self-pay

## 2022-12-02 DIAGNOSIS — I5022 Chronic systolic (congestive) heart failure: Secondary | ICD-10-CM

## 2022-12-02 NOTE — Telephone Encounter (Signed)
-----   Message from [place] sent at 12/01/2022  2:16 PM EDT ----- Renal fx and K stable. BNP mildly elevated. [name] [name] was added back today which will help w/ fluid. Needs f/u BMP in 7-10 days to recheck SCr and K

## 2022-12-02 NOTE — Telephone Encounter (Signed)
Baird Cancer, RN 12/02/2022  4:25 PM EDT Back to Top    Spoke with patient regarding the following results. Patient made aware and patient verbalized understanding.   Repeat BMET ordered and scheduled.   Please see telephone encounter.    Patient reports that her blood pressure last night before taking Entresto was 200/78   This morning after taking entresto it was 177/70  Advised patient to keep eye on blood pressure and write these numbers down. Advised patient to let us know if BP continuing to run elevated. Patient aware of all instructions and verbalized understanding. Will forward to provider to  make aware.

## 2022-12-03 MED ORDER — ENTRESTO 49-51 MG PO TABS: 1.0000 | ORAL_TABLET | Freq: Two times a day (BID) | ORAL | Status: DC

## 2022-12-03 NOTE — Telephone Encounter (Signed)
Return call to patient and made her aware to increase entresto to 49/51mg  twice daily.   She will continue to monitor BP and let us know if any issues.     Robbie Lis M, PA-C  You22 hours ago (4:53 PM)    She can increase entresto to 49-51 bid

## 2022-12-09 ENCOUNTER — Other Ambulatory Visit (HOSPITAL_COMMUNITY): Payer: Medicare Other

## 2022-12-13 ENCOUNTER — Ambulatory Visit (HOSPITAL_COMMUNITY)
Admission: RE | Admit: 2022-12-13 | Discharge: 2022-12-13 | Disposition: A | Discharge status: Home / Self Care | Payer: Medicare Other | Source: Ambulatory Visit | Attending: Cardiology | Admitting: Cardiology

## 2022-12-13 DIAGNOSIS — I5022 Chronic systolic (congestive) heart failure: Secondary | ICD-10-CM

## 2022-12-13 LAB — BASIC METABOLIC PANEL
Anion gap: 7 (ref 5–15)
BUN: 15 mg/dL (ref 8–23)
CO2: 29 mmol/L (ref 22–32)
Calcium: 9.6 mg/dL (ref 8.9–10.3)
Chloride: 106 mmol/L (ref 98–111)
Creatinine, Ser: 0.93 mg/dL (ref 0.44–1.00)
GFR, Estimated: >60 mL/min (ref >60)
Glucose, Bld: 145 mg/dL — ABNORMAL HIGH (ref 70–99)
Potassium: 4.7 mmol/L (ref 3.5–5.1)
Sodium: 142 mmol/L (ref 135–145)

## 2022-12-15 NOTE — Progress Notes (Signed)
Advanced Heart Failure Clinic Note   PCP:  Mosetta Putt, MD    HF Cardiologist:  Dr. Shirlee Latch  HPI:  [name] [name] is a 77 y.o. female who has a history of CAD s/p CABG, diastolic CHF, and cerebrovascular disease with history of CVA presents for followup of CHF and diastolic CHF. She had CABG x 5 in 01/2010.  Prior to the CABG she had multiple PCIs.  She had a CVA in 03/2012 that presented as visual loss.  She had a left carotid stent in 03/2013.    Left heart cath done in Sept. 2014. This showed patent SVG-D, LIMA-LAD, and sequential SVG-OM branches but SVG-RCA and the native RCA were both totally occluded. It was felt that her increased symptoms coincided with occlusion of SVG-RCA. She was started on Imdur to see if this would help with the chest pain and dyspnea. However, she feels like Imdur causes leg cramps and does not think that she can take it. So ranolazine 500 mg BID was started and titrated up to 1000 mg BID.  This helped some but not markedly. Therefore, sent to Dr. Eldridge Dace to address opening RCA CTO. He was able to do this in 06/2013 with 4 overlapping Xience DES.  This led to resolution of exertional chest pain.     [name] [name] was started on Brilinta after PCI.  She became much more short of breath after starting on Brilinta. Brilinta stopped and replaced with Plavix.  Dyspnea improved off Brilinta. Given increasing exertional dyspnea and chest pain,  RHC/LHC was done in 05/2015.  This showed stable CAD with no interventional target.  Left and right heart filling pressures were not significantly increased.  Medical management.    She had an MRI/MRA in May 2018 that showed moderately severe stenosis of the supraclinoid segment of the right internal carotid artery, progressed since the 2016 MR angiogram, and severe basilar artery stenosis.    She developed recurrent exertional chest pain and had LHC in 07/2016, showing 4/5 grafts patent with patent native RCA (similar to prior cath, no  changes).     Echo 02/2017 with EF 55-60%, moderate diastolic dysfunction, PASP 50 mmHg, mildly dilated RV, mild AS, mild-moderate MR.    She reported increased dyspnea and chest pain and had RHC/LHC in 10/2017.  She had DES to sequential SVG-OM1/PLOM.  While hospitalized, she was noted to have runs of atrial fibrillation.  She was very symptomatic with the atrial fibrillation.  Eliquis and amiodarone were started.    She was admitted 12/16-12/19/2019 for cardiomems implant and RHC. She was diuresed with IV Lasix and transitioned to torsemide 100 mg BID + metolazone once/week. DC weight: 295 lbs.   She had a prolonged hospitalization in 03/2018 with acute respiratory failure and fever to 103.  She was thought to have PNA and treated with cefepime.  She was intubated.  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  Amiodarone was stopped and she was put on prednisone. Echo in 03/2018 showed EF down to 25-30% with mid-apical LV severe hypokinesis. She had AKI. Possible ITP triggered by infection, HIT negative.  ITP was treated with Solumedrol. She was discharged to SNF.     ORIF left ankle 05/2018.    12/2018 admission initially with concern for TIAs (staring spells), ended up thinking possible partial seizures.  Head MRI did not show CVA.  Echo in 12/2018 showed EF 45-50%, moderately decreased RV systolic function, moderate RV enlargement, moderate-severe  MR, moderate TR.    Patient was admitted again in 01/2019 with AKI and hypotension in the setting of sinus bradycardia (HR 30s).  She was volume overloaded on exam.  Metoprolol XL was stopped and HR increased to 60s.  She was diuresed with IV Lasix.  TEE in 01/2019 showed EF 50-55%, septal-lateral dyssynchrony, mildly decreased RV function, moderate MR.    Atypical chest pain in 02/2019, Cardiolite showed EF 57%, fixed inferior defect (likely artifact), no ischemia.    Echo in 06/2020 showed EF 60-65%, moderate LVH, normal RV,  PASP 37 mmHg, 2.5 x 2.1 cm calcified mass posterior mitral annulus with mild-moderate MR => ?severe MAC but more circumscribed and prominent than in the past, mild AS mean gradient 12 mmHg and AVA 1.83 cm^2.  Echo in 12/2020 showed EF 55%, normal RV, mass posterior mitral annulus, moderate MR, mild AS, IVC normal.  Cardiac MRI was done in 02/2021 to investigate posterior mitral annulus mass; study showed LV EF 48%, mild LV dilation, normal RV with EF 53%, extensive MAC is likely the source of the posterior mitral annulus mass, basal-mid inferior >50% subendocardial LGE suggestive of prior inferior MI.    Follow up 03/2021 she had chest tightness x 4 weeks. Arranged for [facility] which showed in-stent restenosis of pRCA, felt to be the source of her symptoms given occluded SVG-RCA. Underwent PTCA to pRCA. RHC with normal PCWP and RA pressure, mild pulmonary hypertension, preserved CO. On ASA + Plavix + Eliquis x 1 month (until 05/21/21), then Plavix + Eliquis.    Echo 09/2022 showed EF significantly worse, down to 30-35%, G1DD, normal RV.    Admitted after elective L/RHC after she reported worsening exertional dyspnea and chest tightness.  LHC/RHC showed mildly elevated PCWP with normal RA pressure, mixed pulmonary venous/pulmonary arterial hypertension, and 99% in-stent restenosis in the proximal RCA, at least 2 layers of stent are present. Admitted for cutting balloon angioplasty to ISR in the RCA. Stay complicated by AKI and acute hematoma at cath site with hypotension. Decision was made to defer staged PCI and continue medical therapy for short term.   Seen in the AHF office on 11/05/22 and continued to have episodes of chest pain. Referred back to Dr. Herbie Baltimore to discussed staged intervention. She presented back on 11/24/22 and underwent successful scoring balloon and high-pressure 9 compliant balloon PTCA of diffuse in-stent restenosis of ostial to distal RCA stents reducing stenosis to 30% distally and 20%  proximally. Continued on Eliquis + Plavix.    She presented back to [facility] on 12/01/22 for post hospital f/u. Overall felt much improved since PCI. Chest pain and breathing were much better. Denied resting dyspnea. C/w stable NYHA Class III symptoms confounded by obesity and deconditioning. Her weight was down 7 lbs since last visit. Had only been using torsemide PRN. Had not used her cardiomems in the last wk. Her main concern was hypertension. SBP 140 in clinic , has been as high as 180s at home. She continued w/ occasional dizziness which had been a chronic issue for her but denied syncope/near syncope.   Today she returns to HF clinic for pharmacist medication titration. At last visit with APP, Entresto 24/26 mg BID was restarted. Sherryll Burger was then increased to 49/51 mg BID on 12/03/22 due to elevated home BP (up to 200/78). Of note, medication list showed she was taking spironolactone 12.5 mg daily, but she has been taking 25 mg daily and doing well. Overall she is doing well today. States she  still gets SOB when speaking and has a little chest pressure, but this is much better than before the scoring balloon angioplasty. Did have an episode of chest tightness two weeks ago with "really hard palpitations" so she took a dose of SL NTG and it resolved. She has not needed any more SL NTG. Does get dizzy if she turns too quickly, but believes this is residual from the stroke. BP at home when taken by Home Health is 140-166/70-80s. BP at night when she takes it has been as high as SBP 200. This is before her evening medications. She did have her BP cuff calibrated with Home Health and it is correct. Has been trying to stay busy and keep her mind active. She uses a rollator most of the time but has to use a scooter at the grocery store. She was able to use her rollator to walk from the parking lot to the clinic today, which is an improvement from recent visits. Weight has been stable. She has torsemide for PRN use  if needed. No LEE, PND or orthopnea.    HF Medications: Entresto 49/51 mg BID Spironolactone 12.5 mg daily Torsemide 20 mg PRN  Has the patient been experiencing any side effects to the medications prescribed?  no  Does the patient have any problems obtaining medications due to transportation or finances?   no  Understanding of regimen: good Understanding of indications: good Potential of compliance: good Patient understands to avoid NSAIDs. Patient understands to avoid decongestants.    Pertinent Lab Values: 12/13/22: Serum creatinine 0.93, BUN 15, Potassium 4.7, Sodium 142, BNP 280.6 (12/01/22)  Vital Signs: Weight: 220.2 lbs (last clinic weight: 219.6 lbs) Blood pressure: 166/80  Heart rate: 60   Assessment/Plan: 1. CAD: Occluded native RCA and SVG-RCA.  Status post opening of CTO RCA with 4 overlapping Xience DES in 06/2013.  DES to sequential SVG-OM1/PLOM in 10/2017. LHC in 04/2021 showed severe native coronary disease with 90% in-stent restenosis in the proximal RCA (patient has 2 layers of stent at this site). The sequential SVG-OM1/PLOM was patent, SVG-D was patent, and LIMA-LAD was patent. She underwent PTCA of pRCA. She has chronic angina, however exertional dyspnea has been more prominent. Recent echo showed EF further reduced, now 30-35%. Cath 10/2022 showed 99% in-stent restenosis in the proximal RCA (known occluded SVG-RCA). Other coronary disease was stable. PCI initially was deferred during that admission given AKI which ultimately improved. Returned back to cath lab on 11/24/22 for staged intervention and underwent successful cutting balloon angioplasty to ISR in the RCA w/ only 20-30% distal residual stenosis. CP/dyspnea much improved following PCI  - Continue apixaban and Plavix (has been on chronic Plavix, neuro has wanted her to stay on this for cerebrovascular disease).   - Continue ranolazine 1000 mg BID.  - Off Imdur with headache. - Off Metoprolol XL with  bradycardia.   2. Chronic Combined Systolic and Diastolic CHF: Echo in 02/2017 with EF 55-60%, moderate diastolic dysfunction. Cardiomems placed 01/16/18.  Echo in 04/2018 in setting of severe PNA with intubation showed EF 25-30%, mid-apical LV severe hypokinesis.  Possible stress (Takotsubo-type) cardiomyopathy related to severe medical illness versus progression of CAD.  She has baseline chronic LBBB which could play a role in cardiomyopathy as well (LBBB cardiomyopathy).  Repeat echo in 12/2018 with EF up to 45-50%, moderately dysfunctional RV.  TEE in 01/2019 with EF 50-55% with septal-lateral dyssynchrony and mildly dysfunctional RV.  Echo in 12/2020 with EF 55% with normal RV.  cMRI in 02/2021 with EF 48% and evidence for prior inferior MI.  Echo 08/2022 30-35%, G1DD, normal RV.   - C/w ongoing NYHA class III symptoms, confounded by obesity/deconditioning and overall much improved since undergoing PCI - Euvolemic on exam - Continue torsemide PRN.  - Increase Entresto to 97/103 mg BID. Repeat BMET in 2 weeks.  - Continue spironolactone 25 mg daily. Updated medication list to reflect correct dose.   - Cannot tolerate SGLT2i. - Not on beta blocker due to bradycardia.  - With significant reduction in her EF, her LBBB is wide, 170 ms on 12/01/22 EKG, and she may benefit from CRT. Will repeat echo in 3 months  3. Hyperlipidemia: She remains on Zetia, Crestor and Leqvio.  - Followed in lipid clinic. LDL 25 (10/2022) 4. Hypertension: BP elevated today. Also elevated at home. She has had orthostatic symptoms and has fallen in the past.  -Increase Entresto to 97/103 mg BID as above - continue Amlodipine and Spironolactone  5. Cerebrovascular disease: She has history of CVA and had left carotid stent in 03/2013. Followed by [facility].  Last MRA head showed severe stenosis of supraclinoid RICA and severe basilar artery stenosis.  - Will continue plavix and Eliquis per Neurology recs and given extensive CAD history.   6. OHS/OSA: Cannot tolerate CPAP.  Uses oxygen at night.  7. Atrial fibrillation: Paroxysmal. 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 No recent palpitations. - Continue apixaban 5 mg BID. Denies bleeding. 8. Obesity: Body mass index is 40.17 kg/m. - She is now on Ozempic. 9. Mitral regurgitation: Moderate on 12/2020 echo.  - Mild to moderate on 08/2202 echo.  10. Bradycardia: Now off Metoprolol XL.   11. Aortic stenosis: Mild on 12/2020 echo. Echo 08/2022 showed sclerosis w/o stenosis, mG 4 mmHg.  12. Mitral annular mass: This is a well-circumscribed calcified posterior mitral annulus mass noted on 06/2020 and 12/2020 echoes.  Based on 02/2021 cMRI, likely exuberant MAC. 12. Weakness - working with home PT  Follow up in January 2025 with Dr. Shirlee Latch + echo.   Karle Plumber, PharmD, BCPS, BCCP, CPP Heart Failure Clinic Pharmacist [phone]

## 2022-12-17 ENCOUNTER — Other Ambulatory Visit (HOSPITAL_COMMUNITY): Payer: Self-pay | Admitting: Cardiology

## 2022-12-17 MED ORDER — ENTRESTO 49-51 MG PO TABS: 1.0000 | ORAL_TABLET | Freq: Two times a day (BID) | ORAL | 11 refills | Status: DC

## 2022-12-23 ENCOUNTER — Ambulatory Visit (HOSPITAL_COMMUNITY)
Admission: RE | Admit: 2022-12-23 | Discharge: 2022-12-23 | Disposition: A | Discharge status: Home / Self Care | Payer: Medicare Other | Source: Ambulatory Visit | Attending: Internal Medicine | Admitting: Internal Medicine

## 2022-12-23 DIAGNOSIS — I6523 Occlusion and stenosis of bilateral carotid arteries: Secondary | ICD-10-CM | POA: Insufficient documentation

## 2022-12-27 ENCOUNTER — Ambulatory Visit (HOSPITAL_COMMUNITY)
Admission: RE | Admit: 2022-12-27 | Discharge: 2022-12-27 | Disposition: A | Discharge status: Home / Self Care | Payer: Medicare Other | Source: Ambulatory Visit | Attending: Internal Medicine | Admitting: Internal Medicine

## 2022-12-27 VITALS — BP 166/80 | HR 60 | Wt 220.2 lb

## 2022-12-27 DIAGNOSIS — I480 Paroxysmal atrial fibrillation: Secondary | ICD-10-CM | POA: Diagnosis not present

## 2022-12-27 DIAGNOSIS — Z79899 Other long term (current) drug therapy: Secondary | ICD-10-CM | POA: Diagnosis not present

## 2022-12-27 DIAGNOSIS — Z6841 Body Mass Index (BMI) 40.0 and over, adult: Secondary | ICD-10-CM | POA: Insufficient documentation

## 2022-12-27 DIAGNOSIS — Z7902 Long term (current) use of antithrombotics/antiplatelets: Secondary | ICD-10-CM | POA: Diagnosis not present

## 2022-12-27 DIAGNOSIS — I340 Nonrheumatic mitral (valve) insufficiency: Secondary | ICD-10-CM | POA: Diagnosis not present

## 2022-12-27 DIAGNOSIS — E785 Hyperlipidemia, unspecified: Secondary | ICD-10-CM | POA: Insufficient documentation

## 2022-12-27 DIAGNOSIS — I110 Hypertensive heart disease with heart failure: Secondary | ICD-10-CM | POA: Insufficient documentation

## 2022-12-27 DIAGNOSIS — R0789 Other chest pain: Secondary | ICD-10-CM | POA: Insufficient documentation

## 2022-12-27 DIAGNOSIS — I252 Old myocardial infarction: Secondary | ICD-10-CM | POA: Diagnosis not present

## 2022-12-27 DIAGNOSIS — I2510 Atherosclerotic heart disease of native coronary artery without angina pectoris: Secondary | ICD-10-CM | POA: Diagnosis not present

## 2022-12-27 DIAGNOSIS — I447 Left bundle-branch block, unspecified: Secondary | ICD-10-CM | POA: Insufficient documentation

## 2022-12-27 DIAGNOSIS — I700 Atherosclerosis of aorta: Secondary | ICD-10-CM | POA: Diagnosis not present

## 2022-12-27 DIAGNOSIS — Z951 Presence of aortocoronary bypass graft: Secondary | ICD-10-CM | POA: Insufficient documentation

## 2022-12-27 DIAGNOSIS — R0602 Shortness of breath: Secondary | ICD-10-CM | POA: Diagnosis present

## 2022-12-27 DIAGNOSIS — R531 Weakness: Secondary | ICD-10-CM | POA: Insufficient documentation

## 2022-12-27 DIAGNOSIS — E669 Obesity, unspecified: Secondary | ICD-10-CM | POA: Diagnosis not present

## 2022-12-27 DIAGNOSIS — I5042 Chronic combined systolic (congestive) and diastolic (congestive) heart failure: Secondary | ICD-10-CM | POA: Insufficient documentation

## 2022-12-27 DIAGNOSIS — Z8673 Personal history of transient ischemic attack (TIA), and cerebral infarction without residual deficits: Secondary | ICD-10-CM | POA: Insufficient documentation

## 2022-12-27 MED ORDER — ENTRESTO 97-103 MG PO TABS: 1.0000 | ORAL_TABLET | Freq: Two times a day (BID) | ORAL | 3 refills | Status: DC

## 2022-12-27 MED ORDER — SPIRONOLACTONE 25 MG PO TABS: 25.0000 mg | ORAL_TABLET | Freq: Every day | ORAL | 3 refills | Status: DC

## 2022-12-27 NOTE — Patient Instructions (Signed)
It was a pleasure seeing you today!  MEDICATIONS: -We are changing your medications today -Increase Entresto to 97/103 mg (1 tablet) twice daily -Call if you have questions about your medications.   NEXT APPOINTMENT: Return to clinic in January with Dr. Shirlee Latch. Please call the clinic in beginning of December to schedule your appointment and your echocardiogram.   In general, to take care of your heart failure: -Limit your fluid intake to 2 Liters (half-gallon) per day.   -Limit your salt intake to ideally 2-3 grams (2000-3000 mg) per day. -Weigh yourself daily and record, and bring that "weight diary" to your next appointment.  (Weight gain of 2-3 pounds in 1 day typically means fluid weight.) -The medications for your heart are to help your heart and help you live longer.   -Please contact us before stopping any of your heart medications.  Call the clinic at [phone] with questions or to reschedule future appointments.

## 2022-12-28 ENCOUNTER — Telehealth (HOSPITAL_COMMUNITY): Payer: Self-pay

## 2022-12-28 NOTE — Telephone Encounter (Signed)
Received call from patient's [facility] agency regarding orders that need to be signed and faxed back at earliest convenience- made Dr. Alford Highland nurse aware- will fax the orders over to have them signed and faxed back.

## 2022-12-29 ENCOUNTER — Telehealth (HOSPITAL_COMMUNITY): Payer: Self-pay

## 2022-12-29 ENCOUNTER — Encounter (HOSPITAL_COMMUNITY): Payer: Self-pay

## 2022-12-29 NOTE — Telephone Encounter (Signed)
Pt going out of town, will call back when she is back in town to schedule cardiac rehab.   Closed referral.

## 2023-01-10 ENCOUNTER — Ambulatory Visit (HOSPITAL_COMMUNITY)
Admission: RE | Admit: 2023-01-10 | Discharge: 2023-01-10 | Disposition: A | Discharge status: Home / Self Care | Payer: Medicare Other | Source: Ambulatory Visit | Attending: Cardiology | Admitting: Cardiology

## 2023-01-10 DIAGNOSIS — I5042 Chronic combined systolic (congestive) and diastolic (congestive) heart failure: Secondary | ICD-10-CM | POA: Diagnosis present

## 2023-01-10 LAB — BASIC METABOLIC PANEL
Anion gap: 12 (ref 5–15)
BUN: 19 mg/dL (ref 8–23)
CO2: 23 mmol/L (ref 22–32)
Calcium: 9.8 mg/dL (ref 8.9–10.3)
Chloride: 106 mmol/L (ref 98–111)
Creatinine, Ser: 0.88 mg/dL (ref 0.44–1.00)
GFR, Estimated: >60 mL/min (ref >60)
Glucose, Bld: 135 mg/dL — ABNORMAL HIGH (ref 70–99)
Potassium: 4.1 mmol/L (ref 3.5–5.1)
Sodium: 141 mmol/L (ref 135–145)

## 2023-01-18 ENCOUNTER — Other Ambulatory Visit (HOSPITAL_COMMUNITY): Payer: Self-pay | Admitting: Cardiology

## 2023-02-09 ENCOUNTER — Other Ambulatory Visit (HOSPITAL_COMMUNITY): Payer: Self-pay | Admitting: *Deleted

## 2023-02-10 ENCOUNTER — Ambulatory Visit (HOSPITAL_COMMUNITY)
Admission: RE | Admit: 2023-02-10 | Discharge: 2023-02-10 | Disposition: A | Discharge status: Home / Self Care | Payer: Medicare Other | Source: Ambulatory Visit | Attending: Cardiology | Admitting: Cardiology

## 2023-02-10 ENCOUNTER — Encounter (HOSPITAL_COMMUNITY): Payer: Self-pay

## 2023-02-10 DIAGNOSIS — E785 Hyperlipidemia, unspecified: Secondary | ICD-10-CM | POA: Insufficient documentation

## 2023-02-10 DIAGNOSIS — I5042 Chronic combined systolic (congestive) and diastolic (congestive) heart failure: Secondary | ICD-10-CM | POA: Diagnosis not present

## 2023-02-10 MED ORDER — INCLISIRAN SODIUM 284 MG/1.5ML ~~LOC~~ SOSY
284.0000 mg | PREFILLED_SYRINGE | Freq: Once | SUBCUTANEOUS | Status: AC
  Administered 2023-02-10: 284 mg via SUBCUTANEOUS

## 2023-02-10 MED ORDER — INCLISIRAN SODIUM 284 MG/1.5ML ~~LOC~~ SOSY
PREFILLED_SYRINGE | SUBCUTANEOUS | Status: AC
  Filled 2023-02-10: qty 1.5

## 2023-02-28 ENCOUNTER — Ambulatory Visit (HOSPITAL_BASED_OUTPATIENT_CLINIC_OR_DEPARTMENT_OTHER)
Admission: RE | Admit: 2023-02-28 | Discharge: 2023-02-28 | Disposition: A | Discharge status: Home / Self Care | Payer: Medicare Other | Source: Ambulatory Visit | Attending: Cardiology | Admitting: Cardiology

## 2023-02-28 ENCOUNTER — Encounter (HOSPITAL_COMMUNITY): Payer: Self-pay | Admitting: Cardiology

## 2023-02-28 ENCOUNTER — Inpatient Hospital Stay (HOSPITAL_COMMUNITY)
Admission: RE | Admit: 2023-02-28 | Discharge: 2023-02-28 | Disposition: A | Discharge status: Home / Self Care | Payer: Medicare Other | Source: Ambulatory Visit | Attending: Cardiology | Admitting: Cardiology

## 2023-02-28 ENCOUNTER — Other Ambulatory Visit (HOSPITAL_COMMUNITY): Payer: Self-pay | Admitting: Cardiology

## 2023-02-28 ENCOUNTER — Encounter: Payer: Self-pay | Admitting: *Deleted

## 2023-02-28 ENCOUNTER — Ambulatory Visit (HOSPITAL_COMMUNITY)
Admission: RE | Admit: 2023-02-28 | Discharge: 2023-02-28 | Disposition: A | Discharge status: Home / Self Care | Payer: Medicare Other | Source: Ambulatory Visit | Attending: Family Medicine | Admitting: Family Medicine

## 2023-02-28 VITALS — BP 110/70 | HR 50 | Wt 228.2 lb

## 2023-02-28 DIAGNOSIS — I130 Hypertensive heart and chronic kidney disease with heart failure and stage 1 through stage 4 chronic kidney disease, or unspecified chronic kidney disease: Secondary | ICD-10-CM | POA: Diagnosis not present

## 2023-02-28 DIAGNOSIS — N189 Chronic kidney disease, unspecified: Secondary | ICD-10-CM | POA: Diagnosis not present

## 2023-02-28 DIAGNOSIS — I5042 Chronic combined systolic (congestive) and diastolic (congestive) heart failure: Secondary | ICD-10-CM

## 2023-02-28 DIAGNOSIS — I080 Rheumatic disorders of both mitral and aortic valves: Secondary | ICD-10-CM | POA: Insufficient documentation

## 2023-02-28 DIAGNOSIS — R001 Bradycardia, unspecified: Secondary | ICD-10-CM

## 2023-02-28 DIAGNOSIS — I480 Paroxysmal atrial fibrillation: Secondary | ICD-10-CM | POA: Diagnosis not present

## 2023-02-28 DIAGNOSIS — E1122 Type 2 diabetes mellitus with diabetic chronic kidney disease: Secondary | ICD-10-CM | POA: Insufficient documentation

## 2023-02-28 DIAGNOSIS — E1143 Type 2 diabetes mellitus with diabetic autonomic (poly)neuropathy: Secondary | ICD-10-CM | POA: Insufficient documentation

## 2023-02-28 DIAGNOSIS — E785 Hyperlipidemia, unspecified: Secondary | ICD-10-CM | POA: Insufficient documentation

## 2023-02-28 DIAGNOSIS — I5022 Chronic systolic (congestive) heart failure: Secondary | ICD-10-CM

## 2023-02-28 DIAGNOSIS — I25119 Atherosclerotic heart disease of native coronary artery with unspecified angina pectoris: Secondary | ICD-10-CM | POA: Diagnosis not present

## 2023-02-28 DIAGNOSIS — Z006 Encounter for examination for normal comparison and control in clinical research program: Secondary | ICD-10-CM

## 2023-02-28 LAB — ECHOCARDIOGRAM COMPLETE
Area-P 1/2: 2.68 cm2
MV VTI: 2.13 cm2
S' Lateral: 4.1 cm

## 2023-02-28 MED ORDER — AMLODIPINE BESYLATE 2.5 MG PO TABS: 2.5000 mg | ORAL_TABLET | Freq: Every day | ORAL | 11 refills | Status: DC

## 2023-02-28 MED ORDER — POTASSIUM CHLORIDE ER 10 MEQ PO TBCR: 10.0000 meq | EXTENDED_RELEASE_TABLET | ORAL | 3 refills | Status: DC

## 2023-02-28 MED ORDER — TORSEMIDE 20 MG PO TABS: 20.0000 mg | ORAL_TABLET | ORAL | 3 refills | Status: DC

## 2023-02-28 NOTE — Research (Signed)
SITE: [id number]     Subject #[id number]    Subprotocol: A  Inclusion Criteria  Patients who meet all of the following criteria are eligible for enrollment as study participants:  Yes No  Age > 76 years old X   Eligible to wear Holter Study X    Exclusion Criteria  Patients who meet any of these criteria are not eligible for enrollment as study participants: Yes No  1. Receiving any mechanical (respiratory or circulatory) or renal support therapy at Screening or during Visit #1.  X  2.  Any other conditions that in the opinion of the investigators are likely to prevent compliance with the study protocol or pose a safety concern if the subject participates in the study.  X  3. Poor tolerance, namely susceptible to severe skin allergies from ECG adhesive patch application.  X   Protocol: REV H                                     Residential Zip code*  [address] (First 3 digits ONLY)                                             [facility] Informed Consent   Subject Name: [name] [name]  Subject met inclusion and exclusion criteria.  The informed consent form, study requirements and expectations were reviewed with the subject. Subject had opportunity to read consent and questions and concerns were addressed prior to the signing of the consent form.  The subject verbalized understanding of the trial requirements.  The subject agreed to participate in the EF ACT trial and signed the informed consent at 1055 on 02/28/2023.  The informed consent was obtained prior to performance of any protocol-specific procedures for the subject.  A copy of the signed informed consent was given to the subject and a copy was placed in the subject's medical record.   Brunilda Payor          Current Outpatient Medications:    acetaminophen (TYLENOL) 500 MG tablet, Take 500 mg by mouth every 6 (six) hours as needed for mild pain, moderate pain, fever or headache., Disp: , Rfl:    albuterol (VENTOLIN HFA) 108 (90 Base) MCG/ACT  inhaler, Inhale 2 puffs into the lungs every 4 (four) hours as needed for wheezing or shortness of breath. , Disp: , Rfl:    ALPRAZolam (XANAX) 0.5 MG tablet, Take 0.5-1 mg by mouth See admin instructions. Take 0.5 mg  morning at lunch and 1 mg at bedtime, Disp: , Rfl:    amLODipine (NORVASC) 2.5 MG tablet, Take 1 tablet (2.5 mg total) by mouth at bedtime., Disp: 30 tablet, Rfl: 11   apixaban (ELIQUIS) 5 MG TABS tablet, Take 1 tablet (5 mg total) by mouth 2 (two) times daily., Disp: 180 tablet, Rfl: 3   B Complex-C (B-COMPLEX WITH VITAMIN C) tablet, Take 1 tablet by mouth in the morning., Disp: , Rfl:    baclofen (LIORESAL) 10 MG tablet, Take 10 mg by mouth at bedtime., Disp: , Rfl:    bisacodyl (DULCOLAX) 5 MG EC tablet, Take 5 mg by mouth daily as needed for moderate constipation., Disp: , Rfl:    buPROPion (WELLBUTRIN XL) 300 MG 24 hr tablet, Take 300 mg by mouth daily., Disp: , Rfl:  carboxymethylcellulose (REFRESH PLUS) 0.5 % SOLN, Place 1 drop into both eyes in the morning, at noon, and at bedtime., Disp: , Rfl:    Cholecalciferol (VITAMIN D3) 50 MCG (2000 UT) capsule, Take 2,000 Units by mouth daily in the afternoon., Disp: , Rfl:    clopidogrel (PLAVIX) 75 MG tablet, Take 75 mg by mouth every morning., Disp: , Rfl:    denosumab (PROLIA) 60 MG/ML SOSY injection, Inject 60 mg into the skin every 6 (six) months., Disp: , Rfl:    DULoxetine (CYMBALTA) 30 MG capsule, Take 1 capsule (30 mg total) by mouth daily., Disp: 30 capsule, Rfl: 0   ezetimibe (ZETIA) 10 MG tablet, Take 1 tablet (10 mg total) by mouth at bedtime., Disp: 90 tablet, Rfl: 3   fluocinonide cream (LIDEX) 0.05 %, Apply 1 application. topically 2 (two) times daily as needed (irritation)., Disp: , Rfl:    Fluticasone-Salmeterol (ADVAIR) 250-50 MCG/DOSE AEPB, Inhale 1 puff into the lungs daily., Disp: , Rfl:    gabapentin (NEURONTIN) 600 MG tablet, Take 600-1,200 mg by mouth See admin instructions. Take 1 tablet (600 mg) by mouth in  the morning & take 2 tablets (1200 mg) by mouth at bedtime, Disp: , Rfl:    inclisiran (LEQVIO) 284 MG/1.5ML SOSY injection, Inject 284 mg into the skin every 6 (six) months., Disp: , Rfl:    Insulin Glargine (LANTUS SOLOSTAR) 100 UNIT/ML Solostar Pen, Inject 0-8 Units into the skin at bedtime. Per sliding scale, Disp: , Rfl:    insulin lispro (HUMALOG) 100 UNIT/ML KwikPen, Inject 4-6 Units into the skin See admin instructions. Sliding scale Inject 8 units subcutaneously prior to breakfast and supper; add 4 units for CBG >200, Disp: , Rfl:    loperamide (IMODIUM A-D) 2 MG tablet, Take 2 mg by mouth 4 (four) times daily as needed for diarrhea or loose stools., Disp: , Rfl:    Multiple Vitamin (MULTIVITAMIN WITH MINERALS) TABS tablet, Take 1 tablet by mouth every morning. Centrum - Women over 50, Disp: , Rfl:    Multiple Vitamins-Minerals (OCUVITE EYE HEALTH FORMULA) CAPS, Take 1 capsule by mouth every morning., Disp: , Rfl:    nitroGLYCERIN (NITROSTAT) 0.3 MG SL tablet, DISSOLVE 1 TABLET UNDER TONGUE EVERY 5 MINUTES FOR UP TO 3 DOSES AS NEEDED FOR CHEST PAIN, Disp: 100 tablet, Rfl: 0   ondansetron (ZOFRAN-ODT) 4 MG disintegrating tablet, Take 4 mg by mouth 2 (two) times daily., Disp: , Rfl:    OXYGEN, Inhale 2 L into the lungs at bedtime. Additional during the day, Disp: , Rfl:    pantoprazole (PROTONIX) 40 MG tablet, Take 1 tablet (40 mg total) by mouth daily., Disp: 30 tablet, Rfl: 0   potassium chloride (KLOR-CON) 10 MEQ tablet, Take 1 tablet (10 mEq total) by mouth every other day., Disp: 45 tablet, Rfl: 3   pyridOXINE (VITAMIN B6) 100 MG tablet, Take 100 mg by mouth every morning., Disp: , Rfl:    ranolazine (RANEXA) 1000 MG SR tablet, Take 1 tablet (1,000 mg total) by mouth 2 (two) times daily., Disp: 180 tablet, Rfl: 3   rosuvastatin (CRESTOR) 40 MG tablet, Take 1 tablet (40 mg total) by mouth daily., Disp: 90 tablet, Rfl: 3   sacubitril-valsartan (ENTRESTO) 97-103 MG, Take 1 tablet by mouth 2  (two) times daily., Disp: 180 tablet, Rfl: 3   Semaglutide, 2 MG/DOSE, (OZEMPIC, 2 MG/DOSE,) 8 MG/3ML SOPN, Inject 2 mg into the skin once a week., Disp: 3 mL, Rfl: 11   spironolactone (ALDACTONE) 25  MG tablet, Take 1 tablet (25 mg total) by mouth daily., Disp: 90 tablet, Rfl: 3   torsemide (DEMADEX) 20 MG tablet, Take 1 tablet (20 mg total) by mouth every other day., Disp: 45 tablet, Rfl: 3   traZODone (DESYREL) 50 MG tablet, Take 100-150 mg by mouth at bedtime., Disp: , Rfl:    vitamin B-12 (CYANOCOBALAMIN) 1000 MCG tablet, Take 1,000 mcg by mouth every morning. , Disp: , Rfl:    zolpidem (AMBIEN) 10 MG tablet, Take 10 mg by mouth at bedtime., Disp: , Rfl:

## 2023-02-28 NOTE — Progress Notes (Signed)
Date:  02/28/2023   ID:  [name] [name], DOB 16-Feb-1945, [name] [id number]   Provider location: [facility] Type of Visit: Established patient   PCP:  Mosetta Putt, MD  HF Cardiologist:  Dr. Shirlee Latch  Chief Complaint: Follow up for CAD and CHF.   History of Present Illness: [name] [name] is a 78 y.o. female who has a history of CAD s/p CABG, diastolic CHF, and cerebrovascular disease with history of CVA presents for followup of CHF and diastolic CHF. She had CABG x 5 in 12/11.  Prior to the CABG she had multiple PCIs.  She had a CVA in 2/14 that presented as visual loss.  She had a left carotid stent in 2/15.    Left heart cath done in Sept. 2014. This showed patent SVG-D, LIMA-LAD, and sequential SVG-OM branches but SVG-RCA and the native RCA were both totally occluded. It was felt that her increased symptoms coincided with occlusion of SVG-RCA. She was started on Imdur to see if this would help with the chest pain and dyspnea. However, she feels like Imdur causes leg cramps and does not think that she can take it. So ranolazine 500 mg bid started and titrated this up to 1000 mg bid.  This helped some but not markedly. Therefore, sent to Dr. Eldridge Dace to address opening RCA CTO. He was able to do this in 5/15 with 4 overlapping Xience DES.  This led to resolution of exertional chest pain.     [name] [name] was started on Brilinta after PCI.  She became much more short of breath after starting on Brilinta. Brilinta stopped and replaced with Plavix.  Dyspnea improved off Brilinta. Given increasing exertional dyspnea and chest pain,  RHC/LHC was done in 4/17.  This showed stable CAD with no interventional target.  Left and right heart filling pressures were not significantly increased.  Medical management.    She had an MRI/MRA in May 2018 that showed moderately severe stenosis of the supraclinoid segment of the right internal carotid artery, progressed since the 2016 MR angiogram,  and severe basilar artery stenosis.    She developed recurrent exertional chest pain and had LHC in 6/18, showing 4/5 grafts patent with patent native RCA (similar to prior cath, no changes).     Echo 1/19 with EF 55-60%, moderate diastolic dysfunction, PASP 50 mmHg, mildly dilated RV, mild AS, mild-moderate MR.    She reported increased dyspnea and chest pain and had RHC/LHC in 9/19.  She had DES to sequential SVG-OM1/PLOM.  While hospitalized, she was noted to have runs of atrial fibrillation.  She was very symptomatic with the atrial fibrillation.  Eliquis and amiodarone were started.    She was admitted 12/16-12/19/19 for cardiomems implant and RHC. She was diuresed with IV lasix and transitioned to torsemide 100 mg BID + metolazone once/week. DC weight: 295 lbs.   She had a prolonged hospitalization in 2/20 with acute respiratory failure and fever to 103.  She was thought to have PNA and treated with cefepime.  She was intubated.  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  Amiodarone was stopped and she was put on prednisone. Echo in 2/20 showed EF down to 25-30% with mid-apical LV severe hypokinesis. She had AKI. Possible ITP triggered by infection, HIT negative.  ITP was treated with Solumedrol. She was discharged to SNF.    ORIF left ankle 4/20.   11/20 admission initially with concern for TIAs (staring spells),  ended up thinking possible partial seizures.  Head MRI did not show CVA.  Echo in 11/20 showed EF 45-50%, moderately decreased RV systolic function, moderate RV enlargement, moderate-severe MR, moderate TR.   Patient was admitted again in 12/20 with AKI and hypotension in the setting of sinus bradycardia (HR 30s).  She was volume overloaded on exam.  Toprol XL was stopped and HR increased to 60s.  She was diuresed with IV Lasix.  TEE in 12/20 showed EF 50-55%, septal-lateral dyssynchrony, mildly decreased RV function, moderate MR.   Atypical chest pain  in 1/21, Cardiolite showed EF 57%, fixed inferior defect (likely artifact), no ischemia.   Echo in 5/22 showed EF 60-65%, moderate LVH, normal RV, PASP 37 mmHg, 2.5 x 2.1 cm calcified mass posterior mitral annulus with mild-moderate MR => ?severe MAC but more circumscribed and prominent than in the past, mild AS mean gradient 12 mmHg and AVA 1.83 cm^2.  Echo in 11/22 showed EF 55%, normal RV, mass posterior mitral annulus, moderate MR, mild AS, IVC normal.  Cardiac MRI was done in 1/23 to investigate posterior mitral annulus mass; study showed LV EF 48%, mild LV dilation, normal RV with EF 53%, extensive MAC is likely the source of the posterior mitral annulus mass, basal-mid inferior >50% subendocardial LGE suggestive of prior inferior MI.   Follow up 2/23 she had chest tightness x 4 weeks. Arranged for [facility] which showed in-stent restenosis of pRCA, felt to be the source of her symptoms given occluded SVG-RCA. Underwent PTCA to pRCA. RHC with normal PCWP and RA pressure, mild pulmonary hypertension, preserved CO. On ASA + Plavix + Eliquis x 1 month (until 05/21/21), then Plavix + Eliquis.   Echo 8/24 showed EF significantly worse, down to 30-35%, G1DD, normal RV.   With worsening dyspnea and exertional chest pain, LHC/RHC in 9/24 showed mildly elevated PCWP with normal RA pressure, mixed pulmonary venous/pulmonary arterial hypertension, and 99% in-stent restenosis in the proximal RCA, at least 2 layers of stent are present. She then had cutting balloon angioplasty to ISR ostial to distal RCA.   Echo was done today and reviewed, LV mildly dilated with mild LVH, EF 45-50% on my read with septal-lateral dyssynchrony consistent with IVCD, mild RV dysfunction, mild mitral stenosis mean gradient 4 mmHg, no aortic stenosis.   She returns for followup of CHF and CAD.  She is using oxygen at night and prn during the day. She has occasional episodes of lightheadedness with standing, not severe. No syncope.  She  is bradycardic today with junctional beats noted.  She has been having less chest pain since PTCA in 9/24, now CP is generally atypical.  She has finished home PT and is interested in cardiac rehab. She takes torsemide rarely. She is short of breath walking 40-50 feet, uses walker. She has chronic orthopnea.  She has not been using her Cardiomems.   ECG (personally reviewed): NSR with junctional beats, PVCs, LBBB QRS 164 msec  Labs (1/19): K 4.9, creatinine 1.22 Labs (9/19): LDL 108 Labs (10/19): hgb 24.4, K 4.3, creatinine 1.03 Labs 12/06/2017: K 4.7 Creatinine 1.7 Labs (3/20): K 4.8, creatinine 1.39, hgb 12.4 plts 226 Labs (11/20): K 4.2, creatinine 1.01, LDL 38 Labs (12/20): K 3.5, creatinine 1.8 Labs (3/21): K 4.5, creatinine 1.5 Labs (2/22): K 4.2, creatinine 1.13 Labs (4/22): LDL 58 Labs (9/22): K 4.3, creatinine 1.27 Labs (12/22): K 4.4, creatinine 1.13 Labs (3/23): K 4.3, creatinine 1.02 Labs (5/23): K 4.3, creatinine 1.33 Labs (6/23): BNP 140,  K 4.5, creatinine 0.98 Labs (10/23): K 4.2, creatinine 0.86 Labs (12/23): K 4.4, creatinine 0.90, LDL 111 Labs (3/24): BNP 163, K 4.7, creatinine 0.9 Labs (7/24): K 4.9, creatinine 0.95, LDL 83 Labs (9/24): K 4.2, SCr 1.16, LDL 25 Labs (10/24): K 4.3, SCr 1.0  Labs (12/24): K 4.1, creatinine 0.88   PMH: 1. Diabetic gastroparesis 2. Type II diabetes 3. HTN 4. Morbid obesity 5. CAD: s/p CABG in 12/11 after prior PCIs.  LIMA-LAD, SVG-D, seq SVG-OM1 and OM2, SVG-PDA. Adenosine Cardiolite (8/14) with EF 53% and a small reversible apical defect with a medium, partially reversible inferior defect.  LHC (9/14) with patent SVG-D, LIMA-LAD (50% distal LAD), and sequential SVG-OM branches; the SVG-RCA and the native RCA were occluded.  This was managed medically initially, but with ongoing exertional chest pain, it was decided to open CTO.  Patient had CTO opening with 4 overlapping Xience DES in the RCA in 5/15.   - LHC (4/17): SVG-D patent,  LIMA-LAD patent, distal LAD with several 40-50% stenoses, sequential SVG-OM1 and PLOM patent with 50% proximal stenosis (not flow limiting), SVG-RCA TO with patent RCA stents.  - LHC (6/18): 4/5 grafts patent (SVG-RCA TO, same as past), RCA stents patent => no change.  - LHC (9/19): Sequential SVG-PLOM/OM1 with 80% proximal stenosis, s/p DES.  - Cardiolite (1/21): EF 57%, fixed inferior defect (likely artifact), no ischemia.  - LHC (3/23): severe CAD with 90% in-stent restenosis of pRCA, s/p PTCA to in-stent restenosis of pRCA - LHC (9/24): 99% in-stent restenosis in the proximal RCA, at least 2 layers of stent are present => patient had cutting balloon angioplasty to RCA.  6. Atypical migraines 7. OHS/OSA: Intolerant of CPAP. Uses oxygen with exertion and at night.  8. GERD with hiatal hernia 9. OA 10. HFpEF: She has a Cardiomems device.  Echo (11/13) with EF 50-55%, grade II diastolic dysfunction, mild-moderate MR.  Echo (8/14) with EF 55-60%, grade II diastolic dysfunction, mildly increased aortic valve gradient (mean 12 mmHg) but valve opens well, mild MR and mild RV dilation.  Echo (9/15) with EF 60-65%, grade II diastolic dysfunction, mild aortic stenosis, mild mitral stenosis, mild to moderate mitral regurgitation, mildly dilated RV with normal systolic function, PA systolic pressure 46 mmHg.  - RHC (4/17): mean RA 9, PA 38/15 mean 27, mean PCWP 9, CI 2.5.  - Echo (5/17): EF 55-60%, mild LVH, mildly dilated RV with low normal systolic function, moderate TR, PASP 61 mmHg - Echo (1/19): EF 55-60%, moderate diastolic dysfunction, PASP 50 mmHg, mildly dilated RV, mild AS, mild-moderate MR. - RHC (9/19): mean RA 11, PA 34/12, mean PCWP 15, CI 2.85 - Echo (3/20): EF 25-30% with mid-apical severe hypokinesis.  - Echo (11/20): EF 45-50%, moderately dilated RV with moderately decreased systolic function, moderate-severe MR, moderate TR.  - TEE (12/20): EF 50-55%, septal-lateral dyssynchrony, moderate  RV dilation/mildly decreased function, peak RV-RA gradient 51 mmHg, moderate MR.  - Echo (5/22): EF 60-65%, moderate LVH, normal RV, PASP 37 mmHg, 2.5 x 2.1 cm calcified mass posterior mitral annulus with mild-moderate MR => ?severe MAC but more circumscribed and prominent than in the past, mild AS mean gradient 12 mmHg and AVA 1.83 cm^2.  - Echo (11/22): EF 55%, normal RV, mass posterior mitral annulus, moderate MR, mild AS, IVC normal. - Cardiac MRI in 1/23 showed LV EF 48%, mild LV dilation, normal RV with EF 53%, extensive MAC is likely the source of the posterior mitral annulus mass, basal-mid inferior >50%  subendocardial LGE suggestive of prior inferior MI.  - RHC (3/23): normal PCWP (14) and RA pressure (2) , mild pulmonary hypertension, preserved CO/CI (6.51/3.28). - Echo (8/24): EF 30-35%, G1DD, normal RV.  - RHC (9/24): mean RA 5, PA 59/15 mean 36, mean PCWP 22, CI 2.34, PVR 3 WU.   - Echo (1/25): LV mildly dilated with mild LVH, EF 45-50% on my read with septal-lateral dyssynchrony consistent with IVCD, mild RV dysfunction, mild mitral stenosis mean gradient 4 mmHg, no aortic stenosis.  11. CKD 12. Chronic LBBB 13. Anxiety 14. Carotid stenosis: Followed by [facility], >80% LICA stenosis 12/14.  She had left carotid stent in 2/15. Carotid dopplers 8/15 with no significant disease. Carotid dopplers (4/16): < 40% RICA stenosis.  - Carotid dopplers (9/22): 1-39% BICA stenosis.  - Carotid dopplers (11/24): mild BICA stenosis.  15. Cerebrovascular disease: CVA 2/14 with right posterior cerebral artery territory ischemic infarction. Cerebral angiogram in 6/14 showed 70% right vertebral ostial stenosis, 60-65% LICA stenosis, > 70% proximal left posterior cerebral artery stenosis, posterior communicating artery aneurysm.  Patient has had episodes of transient expressive aphasia.  Carotid dopplers (12/14) showed >80% LICA stenosis. She had a left carotid stent 2/15.  Possible CVA in 7/15. - MRI/MRA in May  2018 that showed moderately severe stenosis of the supraclinoid segment of the right internal carotid artery, progressed since the 2016 MR angiogram and severe basilar artery stenosis.  16. Positional vertigo (suspected) 17. Palpitations: Holter (6/15) with rare PVCs/PACs.  - Event monitor (2/18): NSR, occasional PVCs - 14 day Zio (9/23) mostly NSR, rare PVCs and PACs, few short runs of SVT, no atrial fibrillation or worrisome arrhythmias. 18. Dyspnea with Brilinta. 19. Melanoma: On face, s/p excision.   20. Aortic stenosis: Mild on 11/22 echo.  21. Posterior mitral annular mass: Cardiac MRI was done in 1/23 to investigate posterior mitral annulus mass; study showed LV EF 48%, mild LV dilation, normal RV with EF 53%, extensive MAC is likely the source of the posterior mitral annulus mass, basal-mid inferior >50% subendocardial LGE suggestive of prior inferior MI.  22. Lower extremity arterial dopplers (9/16) were normal.  23. Sleep study (4/18): No significant OSA.  24. Atrial fibrillation: Paroxysmal - Amiodarone stopped, ?lung toxicity.  25. H/o ITP in 3/20.  26. Partial seizures 27. Mitral regurgitation: Moderate on TEE in 12/20.  - Mild to moderate on 5/22 echo.  - Moderate on 11/22 echo  Current Outpatient Medications  Medication Sig Dispense Refill   acetaminophen (TYLENOL) 500 MG tablet Take 500 mg by mouth every 6 (six) hours as needed for mild pain, moderate pain, fever or headache.     albuterol (VENTOLIN HFA) 108 (90 Base) MCG/ACT inhaler Inhale 2 puffs into the lungs every 4 (four) hours as needed for wheezing or shortness of breath.      ALPRAZolam (XANAX) 0.5 MG tablet Take 0.5-1 mg by mouth See admin instructions. Take 0.5 mg  morning at lunch and 1 mg at bedtime     apixaban (ELIQUIS) 5 MG TABS tablet Take 1 tablet (5 mg total) by mouth 2 (two) times daily. 180 tablet 3   B Complex-C (B-COMPLEX WITH VITAMIN C) tablet Take 1 tablet by mouth in the morning.     baclofen  (LIORESAL) 10 MG tablet Take 10 mg by mouth at bedtime.     bisacodyl (DULCOLAX) 5 MG EC tablet Take 5 mg by mouth daily as needed for moderate constipation.     buPROPion (WELLBUTRIN XL) 300 MG  24 hr tablet Take 300 mg by mouth daily.     carboxymethylcellulose (REFRESH PLUS) 0.5 % SOLN Place 1 drop into both eyes in the morning, at noon, and at bedtime.     Cholecalciferol (VITAMIN D3) 50 MCG (2000 UT) capsule Take 2,000 Units by mouth daily in the afternoon.     clopidogrel (PLAVIX) 75 MG tablet Take 75 mg by mouth every morning.     denosumab (PROLIA) 60 MG/ML SOSY injection Inject 60 mg into the skin every 6 (six) months.     DULoxetine (CYMBALTA) 30 MG capsule Take 1 capsule (30 mg total) by mouth daily. 30 capsule 0   ezetimibe (ZETIA) 10 MG tablet Take 1 tablet (10 mg total) by mouth at bedtime. 90 tablet 3   fluocinonide cream (LIDEX) 0.05 % Apply 1 application. topically 2 (two) times daily as needed (irritation).     Fluticasone-Salmeterol (ADVAIR) 250-50 MCG/DOSE AEPB Inhale 1 puff into the lungs daily.     gabapentin (NEURONTIN) 600 MG tablet Take 600-1,200 mg by mouth See admin instructions. Take 1 tablet (600 mg) by mouth in the morning & take 2 tablets (1200 mg) by mouth at bedtime     inclisiran (LEQVIO) 284 MG/1.5ML SOSY injection Inject 284 mg into the skin every 6 (six) months.     Insulin Glargine (LANTUS SOLOSTAR) 100 UNIT/ML Solostar Pen Inject 0-8 Units into the skin at bedtime. Per sliding scale     insulin lispro (HUMALOG) 100 UNIT/ML KwikPen Inject 4-6 Units into the skin See admin instructions. Sliding scale Inject 8 units subcutaneously prior to breakfast and supper; add 4 units for CBG >200     loperamide (IMODIUM A-D) 2 MG tablet Take 2 mg by mouth 4 (four) times daily as needed for diarrhea or loose stools.     Multiple Vitamin (MULTIVITAMIN WITH MINERALS) TABS tablet Take 1 tablet by mouth every morning. Centrum - Women over 50     Multiple Vitamins-Minerals  (OCUVITE EYE HEALTH FORMULA) CAPS Take 1 capsule by mouth every morning.     nitroGLYCERIN (NITROSTAT) 0.3 MG SL tablet DISSOLVE 1 TABLET UNDER TONGUE EVERY 5 MINUTES FOR UP TO 3 DOSES AS NEEDED FOR CHEST PAIN 100 tablet 0   ondansetron (ZOFRAN-ODT) 4 MG disintegrating tablet Take 4 mg by mouth 2 (two) times daily.     OXYGEN Inhale 2 L into the lungs at bedtime. Additional during the day     pantoprazole (PROTONIX) 40 MG tablet Take 1 tablet (40 mg total) by mouth daily. 30 tablet 0   potassium chloride (KLOR-CON) 10 MEQ tablet Take 1 tablet (10 mEq total) by mouth every other day. 45 tablet 3   pyridOXINE (VITAMIN B6) 100 MG tablet Take 100 mg by mouth every morning.     ranolazine (RANEXA) 1000 MG SR tablet Take 1 tablet (1,000 mg total) by mouth 2 (two) times daily. 180 tablet 3   rosuvastatin (CRESTOR) 40 MG tablet Take 1 tablet (40 mg total) by mouth daily. 90 tablet 3   sacubitril-valsartan (ENTRESTO) 97-103 MG Take 1 tablet by mouth 2 (two) times daily. 180 tablet 3   Semaglutide, 2 MG/DOSE, (OZEMPIC, 2 MG/DOSE,) 8 MG/3ML SOPN Inject 2 mg into the skin once a week. 3 mL 11   spironolactone (ALDACTONE) 25 MG tablet Take 1 tablet (25 mg total) by mouth daily. 90 tablet 3   torsemide (DEMADEX) 20 MG tablet Take 1 tablet (20 mg total) by mouth every other day. 45 tablet 3   traZODone (  DESYREL) 50 MG tablet Take 100-150 mg by mouth at bedtime.     vitamin B-12 (CYANOCOBALAMIN) 1000 MCG tablet Take 1,000 mcg by mouth every morning.      zolpidem (AMBIEN) 10 MG tablet Take 10 mg by mouth at bedtime.     amLODipine (NORVASC) 2.5 MG tablet Take 1 tablet (2.5 mg total) by mouth at bedtime. 30 tablet 11   No current facility-administered medications for this encounter.   Allergies:   Amoxicillin, Brilinta [ticagrelor], Erythromycin, Flagyl [metronidazole], Penicillins, Isosorbide mononitrate [isosorbide nitrate], Jardiance [empagliflozin], Metformin and related, Tape, and Cefotaxime   Social  History:  The patient  reports that she has never smoked. She has never used smokeless tobacco. She reports that she does not drink alcohol and does not use drugs.   Family History:  The patient's family history includes AAA (abdominal aortic aneurysm) in her father and mother; Cancer in her sister; Deep vein thrombosis in her father and mother; Dementia in her maternal grandmother and mother; Diabetes in her father, paternal aunt, paternal grandmother, paternal uncle, and paternal uncle; Heart attack in her father; Heart attack (age of onset: 48) in her paternal grandfather; Heart disease in her father and paternal uncle; Hyperlipidemia in her father; Hypertension in her father, mother, and sister; Stroke in her maternal grandmother, paternal grandmother, and paternal uncle.   ROS:  Please see the history of present illness.   All other systems are personally reviewed and negative.   Recent Labs: 10/22/2022: ALT 20; TSH 0.750 10/25/2022: Magnesium 2.2 11/05/2022: Hemoglobin 12.4; Platelets 209 12/01/2022: B Natriuretic Peptide 280.6 01/10/2023: BUN 19; Creatinine, Ser 0.88; Potassium 4.1; Sodium 141  Personally reviewed   Wt Readings from Last 3 Encounters:  02/28/23 103.5 kg (228 lb 3.2 oz)  12/27/22 99.9 kg (220 lb 3.2 oz)  12/01/22 99.6 kg (219 lb 9.6 oz)    BP 110/70   Pulse (!) 50   Wt 103.5 kg (228 lb 3.2 oz)   SpO2 95%   BMI 41.74 kg/m   PHYSICAL EXAM: General: NAD, obese Neck: JVP 8 cm with HJR, no thyromegaly or thyroid nodule.  Lungs: Clear to auscultation bilaterally with normal respiratory effort. CV: Nondisplaced PMI.  Heart regular S1/S2, no S3/S4, 2/6 early SEM RUSB.  No peripheral edema.  No carotid bruit.  Normal pedal pulses.  Abdomen: Soft, nontender, no hepatosplenomegaly, no distention.  Skin: Intact without lesions or rashes.  Neurologic: Alert and oriented x 3.  Psych: Normal affect. Extremities: No clubbing or cyanosis.  HEENT: Normal.   Assessment &  Plan: 1. CAD: Occluded native RCA and SVG-RCA.  Status post opening of CTO RCA with 4 overlapping Xience DES in 5/15.  DES to sequential SVG-OM1/PLOM in 9/19. LHC in 3/23 showed severe native coronary disease with 90% in-stent restenosis in the proximal RCA (patient has 2 layers of stent at this site). The sequential SVG-OM1/PLOM was patent, SVG-D was patent, and LIMA-LAD was patent. She underwent PTCA of pRCA. She has chronic angina, however exertional dyspnea has been more prominent. Recent echo showed EF further reduced, now 30-35%. Cath 9/24 showed 99% in-stent restenosis in the proximal RCA (known occluded SVG-RCA). Other coronary disease was stable. She underwent successful cutting balloon angioplasty to ISR in the RCA w/ only 20-30% distal residual stenosis. Chest pain improved, now only has atypical chest pain.  - Continue apixaban and Plavix (has been on chronic Plavix, neuro has wanted her to stay on this for cerebrovascular disease).   - Continue ranolazine 1000 mg  bid.  - Off Imdur with headache. - Off Toprol XL with bradycardia.   - Continue Crestor, Leqvio and Zetia, good LDL in 9/24.  - She has completed home PT.  Given recent PTCA, I will refer her for cardiac rehab.  2. Chronic systolic CHF: Echo in 1/19 with EF 55-60%, moderate diastolic dysfunction. Cardiomems placed 01/16/18.  Echo in 3/20 in setting of severe PNA with intubation showed EF 25-30%, mid-apical LV severe hypokinesis.  Possible stress (Takotsubo-type) cardiomyopathy related to severe medical illness versus progression of CAD.  She has baseline chronic LBBB which could play a role in cardiomyopathy as well (LBBB cardiomyopathy).  Repeat echo in 11/20 with EF up to 45-50%, moderately dysfunctional RV.  TEE in 12/20 with EF 50-55% with septal-lateral dyssynchrony and mildly dysfunctional RV.  Echo in 11/22 with EF 55% with normal RV.  cMRI in 1/23 with EF 48% and evidence for prior inferior MI.  Echo 7/24 30-35%, G1DD, normal  RV.  Echo was done today showing improved LV systolic function, LV mildly dilated with mild LVH, EF 45-50% on my read with septal-lateral dyssynchrony consistent with LBBB, mild RV dysfunction, mild mitral stenosis mean gradient 4 mmHg, no aortic stenosis. Chronic NYHA class III, this is confounded by obesity/deconditioning.  She is mildly volume overloaded on exam.  - Needs to start using Cardiomems again.  - Start torsemide 20 mg every other day and KCl 10 every other day. BMET/BNP today and in 10 days.  - Continue Entresto 24-26 mg bid - continue spiro 25 mg daily  - Cannot tolerate SGLT2i. - Not on beta blocker due to bradycardia.  - EF is now out of range for CRT-D device (has wide LBBB).  3. Hyperlipidemia: She remains on Zetia, Crestor and Leqvio.  - Good lipids in 9/24.  4. Hypertension: BP controlled.  - She is on amlodipine in addition to [facility] and spironolactone. With occasional lightheadedness, can decrease amlodipine to 2.5 mg daily.  5. Cerebrovascular disease: She has history of CVA and had left carotid stent in 2/15. Followed by [facility].  Last MRA head showed severe stenosis of supraclinoid RICA and severe basilar artery stenosis.  - Neurology would like her to continue Plavix for this long-term despite concomitant apixaban use.  6. OHS/OSA: Cannot tolerate CPAP.  Uses oxygen at night.  7. Atrial fibrillation: Paroxysmal. 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 No recent palpitations, NSR today.  - Continue apixaban 5 mg bid.  8. Obesity: Body mass index is 41.74 kg/m. - She is now on Ozempic. 9. Mitral regurgitation: Mild on today's echo.   10. Bradycardia: Now off Toprol XL.  Noted to have junctional beats today in addition to NSR on today's ECG and rate is slow.  - With junctional beats, will arrange for 1 week Zio monitor to assess for clinically significant bradycardia.  11. Mitral annular mass: This is a well-circumscribed calcified posterior  mitral annulus mass noted on echoes.  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  F/u APP in 6 wks.   Signed, Marca Ancona, MD  02/28/2023  [facility] [address] [facility] [place] [address] [phone] (office) [phone] (fax)

## 2023-02-28 NOTE — Patient Instructions (Signed)
CHANGE Amlodipine to 2.5 mg daily.  START Torsemide 20 mg every other day.  START Potassium 10 mEq ( 1 Tab) every other day.  Blood work in 10 days.  Your provider has recommended that  you wear a Zio Patch for 7 days.  This monitor will record your heart rhythm for our review.  IF you have any symptoms while wearing the monitor please press the button.  If you have any issues with the patch or you notice a red or orange light on it please call the company at [phone].  Once you remove the patch please mail it back to the company as soon as possible so we can get the results.  You have been referred to cardiac Rehab. They will call you to arrange your appointment.  Your physician recommends that you schedule a follow-up appointment in: 6 weeks  If you have any questions or concerns before your next appointment please send [place] a message through [place] or call our office at [phone].    TO LEAVE A MESSAGE FOR THE NURSE SELECT OPTION 2, PLEASE LEAVE A MESSAGE INCLUDING: YOUR NAME DATE OF BIRTH CALL BACK NUMBER REASON FOR CALL**this is important as we prioritize the call backs  YOU WILL RECEIVE A CALL BACK THE SAME DAY AS LONG AS YOU CALL BEFORE 4:00 PM  At the [facility], you and your health needs are our priority. As part of our continuing mission to provide you with exceptional heart care, we have created designated Provider Care Teams. These Care Teams include your primary Cardiologist (physician) and Advanced Practice Providers (APPs- Physician Assistants and Nurse Practitioners) who all work together to provide you with the care you need, when you need it.   You may see any of the following providers on your designated Care Team at your next follow up: Dr Arvilla Meres Dr Marca Ancona Dr. Dorthula Nettles Dr. Clearnce Hasten Amy Filbert Schilder, NP Robbie Lis, [place] [facility] [place], [place] Brynda Peon, NP [place] Lee, NP Karle Plumber,  PharmD   Please be sure to bring in all your medications bottles to every appointment.    Thank you for choosing [facility]

## 2023-03-01 ENCOUNTER — Telehealth (HOSPITAL_COMMUNITY): Payer: Self-pay | Admitting: *Deleted

## 2023-03-01 NOTE — Telephone Encounter (Signed)
-----   Message from Marca Ancona sent at 02/28/2023  5:32 PM EST ----- Regarding: RE: Oxygen use at Cardiac Rehab She can use oxygen to keep saturation > 92% while in rehab. ----- Message ----- From: Chelsea Aus, RN Sent: 02/28/2023   3:49 PM EST To: Laurey Morale, MD Subject: Oxygen use at Cardiac Rehab                    Dr. Shirlee Latch,  Thank you for referring [name] [name] to Cardiac rehab.  Although [name] [name] generally uses oxygen during the day prn, I recall she greatly benefited from using oxygen during exercise last time.  What is an appropriate oxygen saturation for [name] [name] to maintain at rest/exertion? Ok to titrate to maintain this saturation?  Thanks so much and we are looking forward to working with Marylene Land again.  Alanson Aly, BSN Cardiac and Emergency planning/management officer

## 2023-03-02 ENCOUNTER — Encounter (HOSPITAL_COMMUNITY): Payer: Self-pay

## 2023-03-02 ENCOUNTER — Telehealth (HOSPITAL_COMMUNITY): Payer: Self-pay

## 2023-03-02 NOTE — Telephone Encounter (Signed)
Attempted to call patient in regards to Cardiac Rehab - LM on VM Mailed letter

## 2023-03-04 ENCOUNTER — Telehealth (HOSPITAL_COMMUNITY): Payer: Self-pay | Admitting: Adult Health

## 2023-03-04 MED ORDER — TORSEMIDE 20 MG PO TABS: 20.0000 mg | ORAL_TABLET | Freq: Every day | ORAL | Status: DC

## 2023-03-04 MED ORDER — POTASSIUM CHLORIDE ER 10 MEQ PO TBCR: 10.0000 meq | EXTENDED_RELEASE_TABLET | Freq: Every day | ORAL | Status: DC

## 2023-03-04 NOTE — Telephone Encounter (Signed)
  Cardiomems Remote Monitoring  S/P Cardiomems Implant   PAD Goal: 10 Most recent reading: 17. Suggestive of fluid accumulation.   Recommended changes: Increase torsemide 20 mg daily.    I continue to review and analyze the patients PA pressures weekly (and more often as needed) to bring PA pressures within the optimal range.    Lenay Lovejoy NP-C  4:09 PM

## 2023-03-04 NOTE — Addendum Note (Signed)
Addended by: Bea Laura B on: 03/04/2023 04:16 PM   Modules accepted: Orders

## 2023-03-04 NOTE — Telephone Encounter (Signed)
Patient is aware of all instructions and verbalized understanding.   Patient will take torsemide 20mg  daily and potassium daily (okay per Amy, NP)   Patient will have repeat labs drawn next week as scheduled.   Medication list updated.   Advised patient to call back to office with any issues, questions, or concerns. Patient verbalized understanding.

## 2023-03-08 ENCOUNTER — Telehealth (HOSPITAL_COMMUNITY): Payer: Self-pay

## 2023-03-08 NOTE — Telephone Encounter (Signed)
 Pt insurance is active and benefits verified through Medicare A/B. Co-pay $0.00, DED $257.00/$203.07 met, out of pocket $0.00/$0.00 met, co-insurance 20%. No pre-authorization required. Passport, 03/08/23 @ 3:00PM, REF#[id number]   How many CR sessions are covered? (36 visits for TCR, 72 visits for ICR)72 Is this a lifetime maximum or an annual maximum? Lifetime Has the member used any of these services to date? 34 Is there a time limit (weeks/months) on start of program and/or program completion? No   2ndary insurance is active and benefits verified through [place].[employer] Co-pay $0.00, DED $0.00/$0.00 met, out of pocket $0.00/$0.00 met, co-insurance 0%. No pre-authorization required. Passport, 03/08/23 @ 3:03PM, REF#[id number]

## 2023-03-08 NOTE — Telephone Encounter (Signed)
Pt called and stated she is interested in CR. Patient will come in for orientation on 03/10/23 @ 930AM (M/W) and will attend the 11:45AM exercise class.  Pensions consultant.   Pt also states she is no longer recv'ing home PT.

## 2023-03-10 ENCOUNTER — Other Ambulatory Visit (HOSPITAL_COMMUNITY): Payer: Self-pay | Admitting: Cardiology

## 2023-03-10 ENCOUNTER — Encounter (HOSPITAL_COMMUNITY): Payer: Self-pay

## 2023-03-10 ENCOUNTER — Encounter (HOSPITAL_COMMUNITY)
Admission: RE | Admit: 2023-03-10 | Discharge: 2023-03-10 | Disposition: A | Discharge status: Home / Self Care | Payer: Medicare Other | Source: Ambulatory Visit | Attending: Cardiology | Admitting: Cardiology

## 2023-03-10 ENCOUNTER — Ambulatory Visit (HOSPITAL_COMMUNITY)
Admission: RE | Admit: 2023-03-10 | Discharge: 2023-03-10 | Disposition: A | Discharge status: Home / Self Care | Payer: Medicare Other | Source: Ambulatory Visit | Attending: Internal Medicine | Admitting: Internal Medicine

## 2023-03-10 VITALS — Ht 61.0 in | Wt 226.0 lb

## 2023-03-10 DIAGNOSIS — I5042 Chronic combined systolic (congestive) and diastolic (congestive) heart failure: Secondary | ICD-10-CM | POA: Insufficient documentation

## 2023-03-10 DIAGNOSIS — Z9861 Coronary angioplasty status: Secondary | ICD-10-CM

## 2023-03-10 LAB — BASIC METABOLIC PANEL
Anion gap: 11 (ref 5–15)
BUN: 23 mg/dL (ref 8–23)
CO2: 28 mmol/L (ref 22–32)
Calcium: 9.5 mg/dL (ref 8.9–10.3)
Chloride: 101 mmol/L (ref 98–111)
Creatinine, Ser: 1.2 mg/dL — ABNORMAL HIGH (ref 0.44–1.00)
GFR, Estimated: 47 mL/min — ABNORMAL LOW (ref >60)
Glucose, Bld: 154 mg/dL — ABNORMAL HIGH (ref 70–99)
Potassium: 5.6 mmol/L — ABNORMAL HIGH (ref 3.5–5.1)
Sodium: 140 mmol/L (ref 135–145)

## 2023-03-10 NOTE — Progress Notes (Signed)
 Cardiac Rehab Medication Review by a Nurse  Does the patient  feel that his/her medications are working for him/her?  yes  Has the patient been experiencing any side effects to the medications prescribed?  yes  Does the patient measure his/her own blood pressure or blood glucose at home?  yes   Does the patient have any problems obtaining medications due to transportation or finances?   no  Understanding of regimen: excellent Understanding of indications: excellent Potential of compliance: excellent     comments: [name] [name] is taking her medications as prescribed and has a good understanding of what her medications are for. [name] [name] says she sometimes experiences an upset stomach from taking entresto . [name] [name] takes her blood pressures daily and checks her CBG's 3 times a day.    [name] [facility] RN 03/10/2023 10:25 AM

## 2023-03-10 NOTE — Progress Notes (Signed)
 Cardiac Individual Treatment Plan  Patient Details  Name: NAYLAH CORK [name]: [id number] Date of Birth: [birth year] Referring Provider:   Flowsheet Row INTENSIVE CARDIAC REHAB ORIENT from 03/10/2023 in [facility]  Referring Provider Dr. Alm Clay, MD       Initial Encounter Date:  Flowsheet Row INTENSIVE CARDIAC REHAB ORIENT from 03/10/2023 in [facility]  Date 03/10/23       Visit Diagnosis: 11/24/22 S/P PTCA RCA  Patient's Home Medications on Admission:  Current Outpatient Medications:    acetaminophen  (TYLENOL ) 500 MG tablet, Take 500 mg by mouth every 6 (six) hours as needed for mild pain, moderate pain, fever or headache., Disp: , Rfl:    albuterol  (VENTOLIN  HFA) 108 (90 Base) MCG/ACT inhaler, Inhale 2 puffs into the lungs every 4 (four) hours as needed for wheezing or shortness of breath. , Disp: , Rfl:    ALPRAZolam  (XANAX ) 0.5 MG tablet, Take 0.5-1 mg by mouth See admin instructions. Take 0.5 mg  morning at lunch and 1 mg at bedtime, Disp: , Rfl:    amLODipine  (NORVASC ) 2.5 MG tablet, Take 1 tablet (2.5 mg total) by mouth at bedtime., Disp: 30 tablet, Rfl: 11   apixaban  (ELIQUIS ) 5 MG TABS tablet, Take 1 tablet (5 mg total) by mouth 2 (two) times daily., Disp: 180 tablet, Rfl: 3   B Complex-C (B-COMPLEX WITH VITAMIN C) tablet, Take 1 tablet by mouth in the morning., Disp: , Rfl:    baclofen (LIORESAL) 10 MG tablet, Take 10 mg by mouth at bedtime., Disp: , Rfl:    bisacodyl  (DULCOLAX) 5 MG EC tablet, Take 5 mg by mouth daily as needed for moderate constipation., Disp: , Rfl:    buPROPion  (WELLBUTRIN  XL) 300 MG 24 hr tablet, Take 300 mg by mouth daily., Disp: , Rfl:    carboxymethylcellulose (REFRESH PLUS) 0.5 % SOLN, Place 1 drop into both eyes in the morning, at noon, and at bedtime., Disp: , Rfl:    Cholecalciferol  (VITAMIN D3) 50 MCG (2000 UT) capsule, Take  2,000 Units by mouth daily in the afternoon., Disp: , Rfl:    clopidogrel  (PLAVIX ) 75 MG tablet, Take 75 mg by mouth every morning., Disp: , Rfl:    DULoxetine  (CYMBALTA ) 30 MG capsule, Take 1 capsule (30 mg total) by mouth daily., Disp: 30 capsule, Rfl: 0   ezetimibe  (ZETIA ) 10 MG tablet, Take 1 tablet (10 mg total) by mouth at bedtime., Disp: 90 tablet, Rfl: 3   fluocinonide cream (LIDEX) 0.05 %, Apply 1 application. topically 2 (two) times daily as needed (irritation)., Disp: , Rfl:    Fluticasone-Salmeterol (ADVAIR) 250-50 MCG/DOSE AEPB, Inhale 1 puff into the lungs daily., Disp: , Rfl:    gabapentin  (NEURONTIN ) 600 MG tablet, Take 600-1,200 mg by mouth See admin instructions. Take 1 tablet (600 mg) by mouth in the morning & take 2 tablets (1200 mg) by mouth at bedtime, Disp: , Rfl:    inclisiran (LEQVIO ) 284 MG/1.5ML SOSY injection, Inject 284 mg into the skin every 6 (six) months., Disp: , Rfl:    Insulin  Glargine (LANTUS  SOLOSTAR) 100 UNIT/ML Solostar Pen, Inject 0-8 Units into the skin at bedtime. Per sliding scale, Disp: , Rfl:    insulin  lispro (HUMALOG ) 100 UNIT/ML KwikPen, Inject 4-6 Units into the skin See admin instructions. Sliding scale Inject 8 units subcutaneously prior to breakfast and supper; add 4 units for CBG >200, Disp: ,  Rfl:    loperamide  (IMODIUM  A-D) 2 MG tablet, Take 2 mg by mouth 4 (four) times daily as needed for diarrhea or loose stools., Disp: , Rfl:    Multiple Vitamin (MULTIVITAMIN WITH MINERALS) TABS tablet, Take 1 tablet by mouth every morning. Centrum - Women over 50, Disp: , Rfl:    Multiple Vitamins-Minerals (OCUVITE EYE HEALTH FORMULA) CAPS, Take 1 capsule by mouth every morning., Disp: , Rfl:    nitroGLYCERIN  (NITROSTAT ) 0.3 MG SL tablet, DISSOLVE 1 TABLET UNDER TONGUE EVERY 5 MINUTES FOR UP TO 3 DOSES AS NEEDED FOR CHEST PAIN, Disp: 100 tablet, Rfl: 0   ondansetron  (ZOFRAN -ODT) 4 MG disintegrating tablet, Take 4 mg by mouth 2 (two) times daily., Disp: , Rfl:     OXYGEN , Inhale 2 L into the lungs at bedtime. Additional during the day, Disp: , Rfl:    pantoprazole  (PROTONIX ) 40 MG tablet, Take 1 tablet (40 mg total) by mouth daily., Disp: 30 tablet, Rfl: 0   potassium chloride  (KLOR-CON ) 10 MEQ tablet, Take 1 tablet (10 mEq total) by mouth daily., Disp: , Rfl:    pyridOXINE (VITAMIN B6) 100 MG tablet, Take 100 mg by mouth every morning., Disp: , Rfl:    ranolazine  (RANEXA ) 1000 MG SR tablet, Take 1 tablet (1,000 mg total) by mouth 2 (two) times daily., Disp: 180 tablet, Rfl: 3   rosuvastatin  (CRESTOR ) 40 MG tablet, Take 1 tablet (40 mg total) by mouth daily., Disp: 90 tablet, Rfl: 3   sacubitril -valsartan  (ENTRESTO ) 97-103 MG, Take 1 tablet by mouth 2 (two) times daily., Disp: 180 tablet, Rfl: 3   Semaglutide , 2 MG/DOSE, (OZEMPIC , 2 MG/DOSE,) 8 MG/3ML SOPN, Inject 2 mg into the skin once a week., Disp: 3 mL, Rfl: 11   spironolactone  (ALDACTONE ) 25 MG tablet, Take 1 tablet (25 mg total) by mouth daily., Disp: 90 tablet, Rfl: 3   torsemide  (DEMADEX ) 20 MG tablet, Take 1 tablet (20 mg total) by mouth daily., Disp: , Rfl:    traZODone  (DESYREL ) 50 MG tablet, Take 100-150 mg by mouth at bedtime., Disp: , Rfl:    vitamin B-12 (CYANOCOBALAMIN ) 1000 MCG tablet, Take 1,000 mcg by mouth every morning. , Disp: , Rfl:    zolpidem  (AMBIEN ) 10 MG tablet, Take 10 mg by mouth at bedtime., Disp: , Rfl:   Past Medical History: Past Medical History:  Diagnosis Date   Anemia    hx   Anxiety    Asthma    Basal cell carcinoma 05/2014   left shoulder   Bundle branch block, left    chronic/notes 07/18/2013   CHF (congestive heart failure) (HCC)    Chronic insomnia 05/06/2015   Chronic kidney disease    frequency, sees dr montie dakins every 4 to 6 months (01/16/2018)   Chronic lower back pain    Claustrophobia    Common migraine 05/14/2014   Coronary artery disease    MI in 2001, 2002, 2006, 2011, 2014   Depression    Diabetic peripheral neuropathy (HCC)  01/12/2019   GERD (gastroesophageal reflux disease)    H/O hiatal hernia    Headache    at least 2/month (01/16/2018)   Heart murmur    Hyperlipidemia    Hypertension    Memory change 05/14/2014   Migraine    5-6/year  (01/16/2018)   Obesity 01-2010   Obstructive sleep apnea    can't wear machine; I have claustrophobia (01/16/2018), states she had a 2nd sleep study and does not have sleep apnea, her  O2 decreases and now is on 2 L of O2 at night.   On home oxygen  therapy    2L at night and prn during daytime (01/16/2018)   Osteoarthritis    knees and hands (01/16/2018)   Peripheral vascular disease (HCC)    ? numbness, tingling arms and legs   PONV (postoperative nausea and vomiting)    Stroke (HCC)    2014, 2015, 2016   Type II diabetes mellitus (HCC)    Ventral hernia    hx of    Tobacco Use: Social History   Tobacco Use  Smoking Status Never  Smokeless Tobacco Never    Labs: Review Flowsheet  More data exists      Latest Ref Rng & Units 04/20/2021 01/13/2022 08/26/2022 10/22/2022 10/30/2022  Labs for ITP Cardiac and Pulmonary Rehab  Cholestrol 0 - 200 mg/dL - 822  834  - 81   LDL (calc) 0 - 99 mg/dL - 888  83  - 25   HDL-C >40 mg/dL - 45  65  - 49   Trlycerides <150 mg/dL - 892  85  - 36   Hemoglobin A1c 4.8 - 5.6 % - - - 6.1  -  Bicarbonate 20.0 - 28.0 mmol/L 20.0 - 28.0 mmol/L 27.2  27.5  - - 28.2  28.7  -  TCO2 22 - 32 mmol/L 22 - 32 mmol/L 29  29  - - 30  30  -  O2 Saturation % % 79  80  - - 68  64  -    Details       Multiple values from one day are sorted in reverse-chronological order         Capillary Blood Glucose: Lab Results  Component Value Date   GLUCAP 118 (H) 11/24/2022   GLUCAP 150 (H) 11/24/2022   GLUCAP 179 (H) 10/30/2022   GLUCAP 142 (H) 10/30/2022   GLUCAP 141 (H) 10/29/2022     Exercise Target Goals: Exercise Program Goal: Individual exercise prescription set using results from initial 6 min walk test and THRR  while considering  patient's activity barriers and safety.   Exercise Prescription Goal: Initial exercise prescription builds to 30-45 minutes a day of aerobic activity, 2-3 days per week.  Home exercise guidelines will be given to patient during program as part of exercise prescription that the participant will acknowledge.  Activity Barriers & Risk Stratification:  Activity Barriers & Cardiac Risk Stratification - 03/10/23 1217       Activity Barriers & Cardiac Risk Stratification   Activity Barriers Joint Problems;Assistive Device;Deconditioning;Muscular Weakness;Arthritis;Back Problems;Shortness of Breath;Decreased Ventricular Function;Chest Pain/Angina;Balance Concerns;History of Falls;Neck/Spine Problems    Cardiac Risk Stratification High             6 Minute Walk:  6 Minute Walk     Row Name 03/10/23 1209         6 Minute Walk   Phase Initial     Distance 1312 feet     Walk Time 6 minutes     # of Rest Breaks 1     MPH 2.48     METS 1.4     RPE 11     Perceived Dyspnea  1     VO2 Peak 4.89     Symptoms Yes (comment)     Comments Supplemental Oxygen  during walk test prior to CP. Right side CP 4 mins in, then radiated to right side, discontinued Stepper test 5 mins in due to cp 2/10.  CP resolved within 2 mins. Back to intake room pt became dizzy, eyes closed and unsteady, sat down BP 118/70, H2o recheck 5 mins 128/70. Dissy spell resolved within 1 min. AVG watts: 13, AVG MET's:1.5, Total steps: 471, AVG km: 0.40, AVG SPM: 89     Resting HR 50 bpm     Resting BP 142/64     Resting Oxygen  Saturation  96 %     Exercise Oxygen  Saturation  during 6 min walk 100 %     Max Ex. HR 62 bpm     Max Ex. BP 124/70     2 Minute Post BP 118/70              Oxygen  Initial Assessment:   Oxygen  Re-Evaluation:   Oxygen  Discharge (Final Oxygen  Re-Evaluation):   Initial Exercise Prescription:  Initial Exercise Prescription - 03/10/23 1200       Date of Initial  Exercise RX and Referring Provider   Date 03/10/23    Referring Provider Dr. Alm Clay, MD    Expected Discharge Date 06/01/23      Oxygen    Oxygen  Intermittent    Liters 2    Maintain Oxygen  Saturation 88% or higher      NuStep   Level 1    SPM 60    Minutes 20    METs 1.3      Prescription Details   Frequency (times per week) 2    Duration Progress to 30 minutes of continuous aerobic without signs/symptoms of physical distress      Intensity   THRR 40-80% of Max Heartrate 58-116    Ratings of Perceived Exertion 11-13    Perceived Dyspnea 0-4      Progression   Progression Continue progressive overload as per policy without signs/symptoms or physical distress.      Resistance Training   Training Prescription Yes    Weight 1    Reps 10-15             Perform Capillary Blood Glucose checks as needed.  Exercise Prescription Changes:   Exercise Comments:   Exercise Goals and Review:   Exercise Goals Re-Evaluation :   Discharge Exercise Prescription (Final Exercise Prescription Changes):   Nutrition:  Target Goals: Understanding of nutrition guidelines, daily intake of sodium 1500mg , cholesterol 200mg , calories 30% from fat and 7% or less from saturated fats, daily to have 5 or more servings of fruits and vegetables.  Biometrics:  Pre Biometrics - 03/10/23 1221       Pre Biometrics   Height 5' 1 (1.549 m)    Weight 102.5 kg    Waist Circumference 45 inches    Hip Circumference 55 inches    Waist to Hip Ratio 0.82 %    BMI (Calculated) 42.72    Triceps Skinfold 44 mm    % Body Fat 54.5 %    Grip Strength 18 kg    Flexibility --   Bacck pain and issues   Single Leg Stand --   Pt uses Jaser Fullen             Nutrition Therapy Plan and Nutrition Goals:   Nutrition Assessments:  MEDIFICTS Score Key: >=70 Need to make dietary changes  40-70 Heart Healthy Diet <= 40 Therapeutic Level Cholesterol Diet   Flowsheet Row INTENSIVE CARDIAC  REHAB from 12/30/2021 in [facility]  Picture Your Plate Total Score on Discharge 84  Picture Your Plate Scores: <59 Unhealthy dietary pattern with much room for improvement. 41-50 Dietary pattern unlikely to meet recommendations for good health and room for improvement. 51-60 More healthful dietary pattern, with some room for improvement.  >60 Healthy dietary pattern, although there may be some specific behaviors that could be improved.    Nutrition Goals Re-Evaluation:   Nutrition Goals Re-Evaluation:   Nutrition Goals Discharge (Final Nutrition Goals Re-Evaluation):   Psychosocial: Target Goals: Acknowledge presence or absence of significant depression and/or stress, maximize coping skills, provide positive support system. Participant is able to verbalize types and ability to use techniques and skills needed for reducing stress and depression.  Initial Review & Psychosocial Screening:  Initial Psych Review & Screening - 03/10/23 1037       Initial Review   Current issues with Current Depression;History of Depression;Current Sleep Concerns;Current Psychotropic Meds;Current Anxiety/Panic;Current Stress Concerns    Source of Stress Concerns Chronic Illness;Family;Transportation;Unable to participate in former interests or hobbies;Unable to perform yard/household activities    Comments Byanca says that she is depresseed. The anitdepressants that Lakrisha is taking is helping control her depression. Aitanna says the sleep medication she takes helps her go to sleep. Margery admits to having difficulty staying asleep. Deserea says she feels at time she is a burdeon to her family. Nickole does not drive any longer. Jalayla relies on her husband Alm to take her out to appointments, Doctor's appointments, ETC. Shanavia denies the suicidal ideations. Angal denies the need for counselling at this time.      Family Dynamics   Good  Support System? Yes   Jalayiah has her husband Alm, her daughter and two grandchildren for support.   Comments Legaci will have controlled or decreased depression upon completion of cardiac rehab.      Barriers   Psychosocial barriers to participate in program The patient should benefit from training in stress management and relaxation.;Psychosocial barriers identified (see note)      Screening Interventions   Interventions Encouraged to exercise;To provide support and resources with identified psychosocial needs;Provide feedback about the scores to participant    Expected Outcomes Long Term Goal: Stressors or current issues are controlled or eliminated.;Short Term goal: Identification and review with participant of any Quality of Life or Depression concerns found by scoring the questionnaire.;Long Term goal: The participant improves quality of Life and PHQ9 Scores as seen by post scores and/or verbalization of changes             Quality of Life Scores:  Quality of Life - 03/10/23 1222       Quality of Life   Select Quality of Life      Quality of Life Scores   Health/Function Pre 8.53 %    Socioeconomic Pre 27.5 %    Psych/Spiritual Pre 12 %    Family Pre 24 %    GLOBAL Pre 15.53 %            Scores of 19 and below usually indicate a poorer quality of life in these areas.  A difference of  2-3 points is a clinically meaningful difference.  A difference of 2-3 points in the total score of the Quality of Life Index has been associated with significant improvement in overall quality of life, self-image, physical symptoms, and general health in studies assessing change in quality of life.  PHQ-9: Review Flowsheet  More data exists      03/10/2023 12/30/2021 10/26/2021 10/15/2021 10/12/2018  Depression screen PHQ 2/9  Decreased  Interest 3 3 3 1 2   Down, Depressed, Hopeless 2 1 3  0 1  PHQ - 2 Score 5 4 6 1 3   Altered sleeping 2 3 1  - 1  Tired, decreased energy 3 3 3  - 1  Change  in appetite 2 1 0 - 0  Feeling bad or failure about yourself  3 3 3  - 1  Trouble concentrating 2 1 2  - 1  Moving slowly or fidgety/restless 2 0 1 - 0  Suicidal thoughts 2 0 0 - 0  PHQ-9 Score 21 15 16  - 7  Difficult doing work/chores Somewhat difficult Very difficult Very difficult - Somewhat difficult   Interpretation of Total Score  Total Score Depression Severity:  1-4 = Minimal depression, 5-9 = Mild depression, 10-14 = Moderate depression, 15-19 = Moderately severe depression, 20-27 = Severe depression   Psychosocial Evaluation and Intervention:   Psychosocial Re-Evaluation:   Psychosocial Discharge (Final Psychosocial Re-Evaluation):   Vocational Rehabilitation: Provide vocational rehab assistance to qualifying candidates.   Vocational Rehab Evaluation & Intervention:  Vocational Rehab - 03/10/23 1137       Initial Vocational Rehab Evaluation & Intervention   Assessment shows need for Vocational Rehabilitation No   Wilma is semi retired and does not need vocational rehab at this time            Education: Education Goals: Education classes will be provided on a weekly basis, covering required topics. Participant will state understanding/return demonstration of topics presented.     Core Videos: Exercise    Move It!  Clinical staff conducted group or individual video education with verbal and written material and guidebook.  Patient learns the recommended Pritikin exercise program. Exercise with the goal of living a long, healthy life. Some of the health benefits of exercise include controlled diabetes, healthier blood pressure levels, improved cholesterol levels, improved heart and lung capacity, improved sleep, and better body composition. Everyone should speak with their doctor before starting or changing an exercise routine.  Biomechanical Limitations Clinical staff conducted group or individual video education with verbal and written material and guidebook.   Patient learns how biomechanical limitations can impact exercise and how we can mitigate and possibly overcome limitations to have an impactful and balanced exercise routine.  Body Composition Clinical staff conducted group or individual video education with verbal and written material and guidebook.  Patient learns that body composition (ratio of muscle mass to fat mass) is a key component to assessing overall fitness, rather than body weight alone. Increased fat mass, especially visceral belly fat, can put us  at increased risk for metabolic syndrome, type 2 diabetes, heart disease, and even death. It is recommended to combine diet and exercise (cardiovascular and resistance training) to improve your body composition. Seek guidance from your physician and exercise physiologist before implementing an exercise routine.  Exercise Action Plan Clinical staff conducted group or individual video education with verbal and written material and guidebook.  Patient learns the recommended strategies to achieve and enjoy long-term exercise adherence, including variety, self-motivation, self-efficacy, and positive decision making. Benefits of exercise include fitness, good health, weight management, more energy, better sleep, less stress, and overall well-being.  Medical   Heart Disease Risk Reduction Clinical staff conducted group or individual video education with verbal and written material and guidebook.  Patient learns our heart is our most vital organ as it circulates oxygen , nutrients, white blood cells, and hormones throughout the entire body, and carries waste away. Data supports a plant-based eating  plan like the Pritikin Program for its effectiveness in slowing progression of and reversing heart disease. The video provides a number of recommendations to address heart disease.   Metabolic Syndrome and Belly Fat  Clinical staff conducted group or individual video education with verbal and written  material and guidebook.  Patient learns what metabolic syndrome is, how it leads to heart disease, and how one can reverse it and keep it from coming back. You have metabolic syndrome if you have 3 of the following 5 criteria: abdominal obesity, high blood pressure, high triglycerides, low HDL cholesterol, and high blood sugar.  Hypertension and Heart Disease Clinical staff conducted group or individual video education with verbal and written material and guidebook.  Patient learns that high blood pressure, or hypertension, is very common in the [place] . Hypertension is largely due to excessive salt intake, but other important risk factors include being overweight, physical inactivity, drinking too much alcohol , smoking, and not eating enough potassium from fruits and vegetables. High blood pressure is a leading risk factor for heart attack, stroke, congestive heart failure, dementia, kidney failure, and premature death. Long-term effects of excessive salt intake include stiffening of the arteries and thickening of heart muscle and organ damage. Recommendations include ways to reduce hypertension and the risk of heart disease.  Diseases of Our Time - Focusing on Diabetes Clinical staff conducted group or individual video education with verbal and written material and guidebook.  Patient learns why the best way to stop diseases of our time is prevention, through food and other lifestyle changes. Medicine (such as prescription pills and surgeries) is often only a Band-Aid on the problem, not a long-term solution. Most common diseases of our time include obesity, type 2 diabetes, hypertension, heart disease, and cancer. The Pritikin Program is recommended and has been proven to help reduce, reverse, and/or prevent the damaging effects of metabolic syndrome.  Nutrition   Overview of the Pritikin Eating Plan  Clinical staff conducted group or individual video education with verbal and written material  and guidebook.  Patient learns about the Pritikin Eating Plan for disease risk reduction. The Pritikin Eating Plan emphasizes a wide variety of unrefined, minimally-processed carbohydrates, like fruits, vegetables, whole grains, and legumes. Go, Caution, and Stop food choices are explained. Plant-based and lean animal proteins are emphasized. Rationale provided for low sodium intake for blood pressure control, low added sugars for blood sugar stabilization, and low added fats and oils for coronary artery disease risk reduction and weight management.  Calorie Density  Clinical staff conducted group or individual video education with verbal and written material and guidebook.  Patient learns about calorie density and how it impacts the Pritikin Eating Plan. Knowing the characteristics of the food you choose will help you decide whether those foods will lead to weight gain or weight loss, and whether you want to consume more or less of them. Weight loss is usually a side effect of the Pritikin Eating Plan because of its focus on low calorie-dense foods.  Label Reading  Clinical staff conducted group or individual video education with verbal and written material and guidebook.  Patient learns about the Pritikin recommended label reading guidelines and corresponding recommendations regarding calorie density, added sugars, sodium content, and whole grains.  Dining Out - Part 1  Clinical staff conducted group or individual video education with verbal and written material and guidebook.  Patient learns that restaurant meals can be sabotaging because they can be so high in calories, fat, sodium, and/or  sugar. Patient learns recommended strategies on how to positively address this and avoid unhealthy pitfalls.  Facts on Fats  Clinical staff conducted group or individual video education with verbal and written material and guidebook.  Patient learns that lifestyle modifications can be just as effective, if not  more so, as many medications for lowering your risk of heart disease. A Pritikin lifestyle can help to reduce your risk of inflammation and atherosclerosis (cholesterol build-up, or plaque, in the artery walls). Lifestyle interventions such as dietary choices and physical activity address the cause of atherosclerosis. A review of the types of fats and their impact on blood cholesterol levels, along with dietary recommendations to reduce fat intake is also included.  Nutrition Action Plan  Clinical staff conducted group or individual video education with verbal and written material and guidebook.  Patient learns how to incorporate Pritikin recommendations into their lifestyle. Recommendations include planning and keeping personal health goals in mind as an important part of their success.  Healthy Mind-Set    Healthy Minds, Bodies, Hearts  Clinical staff conducted group or individual video education with verbal and written material and guidebook.  Patient learns how to identify when they are stressed. Video will discuss the impact of that stress, as well as the many benefits of stress management. Patient will also be introduced to stress management techniques. The way we think, act, and feel has an impact on our hearts.  How Our Thoughts Can Heal Our Hearts  Clinical staff conducted group or individual video education with verbal and written material and guidebook.  Patient learns that negative thoughts can cause depression and anxiety. This can result in negative lifestyle behavior and serious health problems. Cognitive behavioral therapy is an effective method to help control our thoughts in order to change and improve our emotional outlook.  Additional Videos:  Exercise    Improving Performance  Clinical staff conducted group or individual video education with verbal and written material and guidebook.  Patient learns to use a non-linear approach by alternating intensity levels and lengths of  time spent exercising to help burn more calories and lose more body fat. Cardiovascular exercise helps improve heart health, metabolism, hormonal balance, blood sugar control, and recovery from fatigue. Resistance training improves strength, endurance, balance, coordination, reaction time, metabolism, and muscle mass. Flexibility exercise improves circulation, posture, and balance. Seek guidance from your physician and exercise physiologist before implementing an exercise routine and learn your capabilities and proper form for all exercise.  Introduction to Yoga  Clinical staff conducted group or individual video education with verbal and written material and guidebook.  Patient learns about yoga, a discipline of the coming together of mind, breath, and body. The benefits of yoga include improved flexibility, improved range of motion, better posture and core strength, increased lung function, weight loss, and positive self-image. Yoga's heart health benefits include lowered blood pressure, healthier heart rate, decreased cholesterol and triglyceride levels, improved immune function, and reduced stress. Seek guidance from your physician and exercise physiologist before implementing an exercise routine and learn your capabilities and proper form for all exercise.  Medical   Aging: Enhancing Your Quality of Life  Clinical staff conducted group or individual video education with verbal and written material and guidebook.  Patient learns key strategies and recommendations to stay in good physical health and enhance quality of life, such as prevention strategies, having an advocate, securing a Health Care Proxy and Power of Attorney, and keeping a list of medications and system for tracking  them. It also discusses how to avoid risk for bone loss.  Biology of Weight Control  Clinical staff conducted group or individual video education with verbal and written material and guidebook.  Patient learns that weight  gain occurs because we consume more calories than we burn (eating more, moving less). Even if your body weight is normal, you may have higher ratios of fat compared to muscle mass. Too much body fat puts you at increased risk for cardiovascular disease, heart attack, stroke, type 2 diabetes, and obesity-related cancers. In addition to exercise, following the Pritikin Eating Plan can help reduce your risk.  Decoding Lab Results  Clinical staff conducted group or individual video education with verbal and written material and guidebook.  Patient learns that lab test reflects one measurement whose values change over time and are influenced by many factors, including medication, stress, sleep, exercise, food, hydration, pre-existing medical conditions, and more. It is recommended to use the knowledge from this video to become more involved with your lab results and evaluate your numbers to speak with your doctor.   Diseases of Our Time - Overview  Clinical staff conducted group or individual video education with verbal and written material and guidebook.  Patient learns that according to the CDC, 50% to 70% of chronic diseases (such as obesity, type 2 diabetes, elevated lipids, hypertension, and heart disease) are avoidable through lifestyle improvements including healthier food choices, listening to satiety cues, and increased physical activity.  Sleep Disorders Clinical staff conducted group or individual video education with verbal and written material and guidebook.  Patient learns how good quality and duration of sleep are important to overall health and well-being. Patient also learns about sleep disorders and how they impact health along with recommendations to address them, including discussing with a physician.  Nutrition  Dining Out - Part 2 Clinical staff conducted group or individual video education with verbal and written material and guidebook.  Patient learns how to plan ahead and  communicate in order to maximize their dining experience in a healthy and nutritious manner. Included are recommended food choices based on the type of restaurant the patient is visiting.   Fueling a Banker conducted group or individual video education with verbal and written material and guidebook.  There is a strong connection between our food choices and our health. Diseases like obesity and type 2 diabetes are very prevalent and are in large-part due to lifestyle choices. The Pritikin Eating Plan provides plenty of food and hunger-curbing satisfaction. It is easy to follow, affordable, and helps reduce health risks.  Menu Workshop  Clinical staff conducted group or individual video education with verbal and written material and guidebook.  Patient learns that restaurant meals can sabotage health goals because they are often packed with calories, fat, sodium, and sugar. Recommendations include strategies to plan ahead and to communicate with the manager, chef, or server to help order a healthier meal.  Planning Your Eating Strategy  Clinical staff conducted group or individual video education with verbal and written material and guidebook.  Patient learns about the Pritikin Eating Plan and its benefit of reducing the risk of disease. The Pritikin Eating Plan does not focus on calories. Instead, it emphasizes high-quality, nutrient-rich foods. By knowing the characteristics of the foods, we choose, we can determine their calorie density and make informed decisions.  Targeting Your Nutrition Priorities  Clinical staff conducted group or individual video education with verbal and written material and guidebook.  Patient  learns that lifestyle habits have a tremendous impact on disease risk and progression. This video provides eating and physical activity recommendations based on your personal health goals, such as reducing LDL cholesterol, losing weight, preventing or controlling  type 2 diabetes, and reducing high blood pressure.  Vitamins and Minerals  Clinical staff conducted group or individual video education with verbal and written material and guidebook.  Patient learns different ways to obtain key vitamins and minerals, including through a recommended healthy diet. It is important to discuss all supplements you take with your doctor.   Healthy Mind-Set    Smoking Cessation  Clinical staff conducted group or individual video education with verbal and written material and guidebook.  Patient learns that cigarette smoking and tobacco addiction pose a serious health risk which affects millions of people. Stopping smoking will significantly reduce the risk of heart disease, lung disease, and many forms of cancer. Recommended strategies for quitting are covered, including working with your doctor to develop a successful plan.  Culinary   Becoming a Set Designer conducted group or individual video education with verbal and written material and guidebook.  Patient learns that cooking at home can be healthy, cost-effective, quick, and puts them in control. Keys to cooking healthy recipes will include looking at your recipe, assessing your equipment needs, planning ahead, making it simple, choosing cost-effective seasonal ingredients, and limiting the use of added fats, salts, and sugars.  Cooking - Breakfast and Snacks  Clinical staff conducted group or individual video education with verbal and written material and guidebook.  Patient learns how important breakfast is to satiety and nutrition through the entire day. Recommendations include key foods to eat during breakfast to help stabilize blood sugar levels and to prevent overeating at meals later in the day. Planning ahead is also a key component.  Cooking - Educational Psychologist conducted group or individual video education with verbal and written material and guidebook.  Patient learns  eating strategies to improve overall health, including an approach to cook more at home. Recommendations include thinking of animal protein as a side on your plate rather than center stage and focusing instead on lower calorie dense options like vegetables, fruits, whole grains, and plant-based proteins, such as beans. Making sauces in large quantities to freeze for later and leaving the skin on your vegetables are also recommended to maximize your experience.  Cooking - Healthy Salads and Dressing Clinical staff conducted group or individual video education with verbal and written material and guidebook.  Patient learns that vegetables, fruits, whole grains, and legumes are the foundations of the Pritikin Eating Plan. Recommendations include how to incorporate each of these in flavorful and healthy salads, and how to create homemade salad dressings. Proper handling of ingredients is also covered. Cooking - Soups and [employer] - Soups and Desserts Clinical staff conducted group or individual video education with verbal and written material and guidebook.  Patient learns that Pritikin soups and desserts make for easy, nutritious, and delicious snacks and meal components that are low in sodium, fat, sugar, and calorie density, while high in vitamins, minerals, and filling fiber. Recommendations include simple and healthy ideas for soups and desserts.   Overview     The Pritikin Solution Program Overview Clinical staff conducted group or individual video education with verbal and written material and guidebook.  Patient learns that the results of the Pritikin Program have been documented in more than 100 articles published in peer-reviewed  journals, and the benefits include reducing risk factors for (and, in some cases, even reversing) high cholesterol, high blood pressure, type 2 diabetes, obesity, and more! An overview of the three key pillars of the Pritikin Program will be covered: eating well,  doing regular exercise, and having a healthy mind-set.  WORKSHOPS  Exercise: Exercise Basics: Building Your Action Plan Clinical staff led group instruction and group discussion with PowerPoint presentation and patient guidebook. To enhance the learning environment the use of posters, models and videos may be added. At the conclusion of this workshop, patients will comprehend the difference between physical activity and exercise, as well as the benefits of incorporating both, into their routine. Patients will understand the FITT (Frequency, Intensity, Time, and Type) principle and how to use it to build an exercise action plan. In addition, safety concerns and other considerations for exercise and cardiac rehab will be addressed by the presenter. The purpose of this lesson is to promote a comprehensive and effective weekly exercise routine in order to improve patients' overall level of fitness.   Managing Heart Disease: Your Path to a Healthier Heart Clinical staff led group instruction and group discussion with PowerPoint presentation and patient guidebook. To enhance the learning environment the use of posters, models and videos may be added.At the conclusion of this workshop, patients will understand the anatomy and physiology of the heart. Additionally, they will understand how Pritikin's three pillars impact the risk factors, the progression, and the management of heart disease.  The purpose of this lesson is to provide a high-level overview of the heart, heart disease, and how the Pritikin lifestyle positively impacts risk factors.  Exercise Biomechanics Clinical staff led group instruction and group discussion with PowerPoint presentation and patient guidebook. To enhance the learning environment the use of posters, models and videos may be added. Patients will learn how the structural parts of their bodies function and how these functions impact their daily activities, movement, and  exercise. Patients will learn how to promote a neutral spine, learn how to manage pain, and identify ways to improve their physical movement in order to promote healthy living. The purpose of this lesson is to expose patients to common physical limitations that impact physical activity. Participants will learn practical ways to adapt and manage aches and pains, and to minimize their effect on regular exercise. Patients will learn how to maintain good posture while sitting, walking, and lifting.  Balance Training and Fall Prevention  Clinical staff led group instruction and group discussion with PowerPoint presentation and patient guidebook. To enhance the learning environment the use of posters, models and videos may be added. At the conclusion of this workshop, patients will understand the importance of their sensorimotor skills (vision, proprioception, and the vestibular system) in maintaining their ability to balance as they age. Patients will apply a variety of balancing exercises that are appropriate for their current level of function. Patients will understand the common causes for poor balance, possible solutions to these problems, and ways to modify their physical environment in order to minimize their fall risk. The purpose of this lesson is to teach patients about the importance of maintaining balance as they age and ways to minimize their risk of falling.  WORKSHOPS   Nutrition:  Fueling a Ship Broker led group instruction and group discussion with PowerPoint presentation and patient guidebook. To enhance the learning environment the use of posters, models and videos may be added. Patients will review the foundational principles of the Pritikin  Eating Plan and understand what constitutes a serving size in each of the food groups. Patients will also learn Pritikin-friendly foods that are better choices when away from home and review make-ahead meal and snack options.  Calorie density will be reviewed and applied to three nutrition priorities: weight maintenance, weight loss, and weight gain. The purpose of this lesson is to reinforce (in a group setting) the key concepts around what patients are recommended to eat and how to apply these guidelines when away from home by planning and selecting Pritikin-friendly options. Patients will understand how calorie density may be adjusted for different weight management goals.  Mindful Eating  Clinical staff led group instruction and group discussion with PowerPoint presentation and patient guidebook. To enhance the learning environment the use of posters, models and videos may be added. Patients will briefly review the concepts of the Pritikin Eating Plan and the importance of low-calorie dense foods. The concept of mindful eating will be introduced as well as the importance of paying attention to internal hunger signals. Triggers for non-hunger eating and techniques for dealing with triggers will be explored. The purpose of this lesson is to provide patients with the opportunity to review the basic principles of the Pritikin Eating Plan, discuss the value of eating mindfully and how to measure internal cues of hunger and fullness using the Hunger Scale. Patients will also discuss reasons for non-hunger eating and learn strategies to use for controlling emotional eating.  Targeting Your Nutrition Priorities Clinical staff led group instruction and group discussion with PowerPoint presentation and patient guidebook. To enhance the learning environment the use of posters, models and videos may be added. Patients will learn how to determine their genetic susceptibility to disease by reviewing their family history. Patients will gain insight into the importance of diet as part of an overall healthy lifestyle in mitigating the impact of genetics and other environmental insults. The purpose of this lesson is to provide patients with the  opportunity to assess their personal nutrition priorities by looking at their family history, their own health history and current risk factors. Patients will also be able to discuss ways of prioritizing and modifying the Pritikin Eating Plan for their highest risk areas  Menu  Clinical staff led group instruction and group discussion with PowerPoint presentation and patient guidebook. To enhance the learning environment the use of posters, models and videos may be added. Using menus brought in from [employer], or printed from [employer], patients will apply the Pritikin dining out guidelines that were presented in the [employer] video. Patients will also be able to practice these guidelines in a variety of provided scenarios. The purpose of this lesson is to provide patients with the opportunity to practice hands-on learning of the Pritikin Dining Out guidelines with actual menus and practice scenarios.  Label Reading Clinical staff led group instruction and group discussion with PowerPoint presentation and patient guidebook. To enhance the learning environment the use of posters, models and videos may be added. Patients will review and discuss the Pritikin label reading guidelines presented in Pritikin's Label Reading Educational series video. Using fool labels brought in from local grocery stores and markets, patients will apply the label reading guidelines and determine if the packaged food meet the Pritikin guidelines. The purpose of this lesson is to provide patients with the opportunity to review, discuss, and practice hands-on learning of the Pritikin Label Reading guidelines with actual packaged food labels. Cooking School  Pritikin's [employer]  are designed to teach patients ways to prepare quick, simple, and affordable recipes at home. The importance of nutrition's role in chronic disease risk reduction is reflected in its emphasis in the overall  Pritikin program. By learning how to prepare essential core Pritikin Eating Plan recipes, patients will increase control over what they eat; be able to customize the flavor of foods without the use of added salt, sugar, or fat; and improve the quality of the food they consume. By learning a set of core recipes which are easily assembled, quickly prepared, and affordable, patients are more likely to prepare more healthy foods at home. These workshops focus on convenient breakfasts, simple entres, side dishes, and desserts which can be prepared with minimal effort and are consistent with nutrition recommendations for cardiovascular risk reduction. Cooking [employer] are taught by a armed forces logistics/support/administrative officer (RD) who has been trained by the [employer]. The chef or RD has a clear understanding of the importance of minimizing - if not completely eliminating - added fat, sugar, and sodium in recipes. Throughout the series of Cooking School Workshop sessions, patients will learn about healthy ingredients and efficient methods of cooking to build confidence in their capability to prepare    Cooking School weekly topics:  Adding Flavor- Sodium-Free  Fast and Healthy Breakfasts  Powerhouse Plant-Based Proteins  Satisfying Salads and Dressings  Simple Sides and Sauces  International Cuisine-Spotlight on the [employer] Zones  Delicious Desserts  Savory Soups  [employer] - Meals in a Astronomer Appetizers and Snacks  Comforting Weekend Breakfasts  One-Pot Wonders   Fast Evening Meals  Landscape Architect Your Pritikin Plate  WORKSHOPS   Healthy Mindset (Psychosocial):  Focused Goals, Sustainable Changes Clinical staff led group instruction and group discussion with PowerPoint presentation and patient guidebook. To enhance the learning environment the use of posters, models and videos may be added. Patients will be able to apply effective goal setting strategies  to establish at least one personal goal, and then take consistent, meaningful action toward that goal. They will learn to identify common barriers to achieving personal goals and develop strategies to overcome them. Patients will also gain an understanding of how our mind-set can impact our ability to achieve goals and the importance of cultivating a positive and growth-oriented mind-set. The purpose of this lesson is to provide patients with a deeper understanding of how to set and achieve personal goals, as well as the tools and strategies needed to overcome common obstacles which may arise along the way.  From Head to Heart: The Power of a Healthy Outlook  Clinical staff led group instruction and group discussion with PowerPoint presentation and patient guidebook. To enhance the learning environment the use of posters, models and videos may be added. Patients will be able to recognize and describe the impact of emotions and mood on physical health. They will discover the importance of self-care and explore self-care practices which may work for them. Patients will also learn how to utilize the 4 C's to cultivate a healthier outlook and better manage stress and challenges. The purpose of this lesson is to demonstrate to patients how a healthy outlook is an essential part of maintaining good health, especially as they continue their cardiac rehab journey.  Healthy Sleep for a Healthy Heart Clinical staff led group instruction and group discussion with PowerPoint presentation and patient guidebook. To enhance the learning environment the use of posters, models and videos may be added. At  the conclusion of this workshop, patients will be able to demonstrate knowledge of the importance of sleep to overall health, well-being, and quality of life. They will understand the symptoms of, and treatments for, common sleep disorders. Patients will also be able to identify daytime and nighttime behaviors which impact  sleep, and they will be able to apply these tools to help manage sleep-related challenges. The purpose of this lesson is to provide patients with a general overview of sleep and outline the importance of quality sleep. Patients will learn about a few of the most common sleep disorders. Patients will also be introduced to the concept of "sleep hygiene," and discover ways to self-manage certain sleeping problems through simple daily behavior changes. Finally, the workshop will motivate patients by clarifying the links between quality sleep and their goals of heart-healthy living.   Recognizing and Reducing Stress Clinical staff led group instruction and group discussion with PowerPoint presentation and patient guidebook. To enhance the learning environment the use of posters, models and videos may be added. At the conclusion of this workshop, patients will be able to understand the types of stress reactions, differentiate between acute and chronic stress, and recognize the impact that chronic stress has on their health. They will also be able to apply different coping mechanisms, such as reframing negative self-talk. Patients will have the opportunity to practice a variety of stress management techniques, such as deep abdominal breathing, progressive muscle relaxation, and/or guided imagery.  The purpose of this lesson is to educate patients on the role of stress in their lives and to provide healthy techniques for coping with it.  Learning Barriers/Preferences:  Learning Barriers/Preferences - 03/10/23 1226       Learning Barriers/Preferences   Learning Barriers Exercise Concerns;Sight   Pt wears reading glasses, dizzy spells, poor balance, assistive device            Education Topics:  Knowledge Questionnaire Score:  Knowledge Questionnaire Score - 03/10/23 1227       Knowledge Questionnaire Score   Pre Score 21/24             Core Components/Risk Factors/Patient Goals at  Admission:  Personal Goals and Risk Factors at Admission - 03/10/23 1228       Core Components/Risk Factors/Patient Goals on Admission    Weight Management Yes;Obesity;Weight Loss    Intervention Weight Management: Develop a combined nutrition and exercise program designed to reach desired caloric intake, while maintaining appropriate intake of nutrient and fiber, sodium and fats, and appropriate energy expenditure required for the weight goal.;Weight Management: Provide education and appropriate resources to help participant work on and attain dietary goals.;Weight Management/Obesity: Establish reasonable short term and long term weight goals.;Obesity: Provide education and appropriate resources to help participant work on and attain dietary goals.    Expected Outcomes Long Term: Adherence to nutrition and physical activity/exercise program aimed toward attainment of established weight goal;Short Term: Continue to assess and modify interventions until short term weight is achieved;Weight Maintenance: Understanding of the daily nutrition guidelines, which includes 25-35% calories from fat, 7% or less cal from saturated fats, less than 200mg  cholesterol, less than 1.5gm of sodium, & 5 or more servings of fruits and vegetables daily;Weight Loss: Understanding of general recommendations for a balanced deficit meal plan, which promotes 1-2 lb weight loss per week and includes a negative energy balance of [phone] kcal/d;Understanding recommendations for meals to include 15-35% energy as protein, 25-35% energy from fat, 35-60% energy from carbohydrates, less than 200mg  of  dietary cholesterol, 20-35 gm of total fiber daily;Understanding of distribution of calorie intake throughout the day with the consumption of 4-5 meals/snacks    Diabetes Yes    Intervention Provide education about signs/symptoms and action to take for hypo/hyperglycemia.;Provide education about proper nutrition, including hydration, and  aerobic/resistive exercise prescription along with prescribed medications to achieve blood glucose in normal ranges: Fasting glucose 65-99 mg/dL    Expected Outcomes Short Term: Participant verbalizes understanding of the signs/symptoms and immediate care of hyper/hypoglycemia, proper foot care and importance of medication, aerobic/resistive exercise and nutrition plan for blood glucose control.;Long Term: Attainment of HbA1C < 7%.    Heart Failure Yes    Intervention Provide a combined exercise and nutrition program that is supplemented with education, support and counseling about heart failure. Directed toward relieving symptoms such as shortness of breath, decreased exercise tolerance, and extremity edema.    Expected Outcomes Improve functional capacity of life;Short term: Attendance in program 2-3 days a week with increased exercise capacity. Reported lower sodium intake. Reported increased fruit and vegetable intake. Reports medication compliance.;Short term: Daily weights obtained and reported for increase. Utilizing diuretic protocols set by physician.;Long term: Adoption of self-care skills and reduction of barriers for early signs and symptoms recognition and intervention leading to self-care maintenance.    Hypertension Yes    Intervention Provide education on lifestyle modifcations including regular physical activity/exercise, weight management, moderate sodium restriction and increased consumption of fresh fruit, vegetables, and low fat dairy, alcohol  moderation, and smoking cessation.;Monitor prescription use compliance.    Expected Outcomes Short Term: Continued assessment and intervention until BP is < 140/32mm HG in hypertensive participants. < 130/35mm HG in hypertensive participants with diabetes, heart failure or chronic kidney disease.;Long Term: Maintenance of blood pressure at goal levels.    Lipids Yes    Intervention Provide education and support for participant on nutrition &  aerobic/resistive exercise along with prescribed medications to achieve LDL 70mg , HDL >40mg .    Expected Outcomes Short Term: Participant states understanding of desired cholesterol values and is compliant with medications prescribed. Participant is following exercise prescription and nutrition guidelines.;Long Term: Cholesterol controlled with medications as prescribed, with individualized exercise RX and with personalized nutrition plan. Value goals: LDL < 70mg , HDL > 40 mg.    Personal Goal Other Yes    Personal Goal Gain more independance and increase strength    Intervention Will continue to monitor pt and progress workloads as tolerated without sign or symptom    Expected Outcomes Pt will achieve her goals             Core Components/Risk Factors/Patient Goals Review:    Core Components/Risk Factors/Patient Goals at Discharge (Final Review):    ITP Comments:  ITP Comments     Row Name 03/10/23 0936           ITP Comments Dr. Wilbert Bihari medical director. Introduction to [facility] education/intensive cardiac rehab. Initial orientation packet reviewed with patient.                Comments: Participant attended orientation for the cardiac rehabilitation program on  03/10/2023  to perform initial intake and exercise walk test. Patient introduced to the [facility] education and orientation packet was reviewed. Completed 6-minute walk test, measurements, initial ITP, and exercise prescription. Vital signs stable. Telemetry-BBB, symptomatic. Supplemental Oxygen  2L during walk test prior to CP. Right side CP 4 mins in, then radiated to right side, discontinued Stepper test 5 mins in due to cp 2/10.  CP resolved within 2 mins. Back to intake room pt became dizzy, eyes closed and unsteady, sat down BP 118/70, H2o recheck 5 mins 128/70. Dissy spell resolved within 1 min. See nurse note for additional info.  Service time was from 0825 to 1135.

## 2023-03-10 NOTE — Progress Notes (Signed)
 Christl reported having right to left upper chest crushing with a radiation during her 6 minute nustep test. Exercise stopped at 5 minutes. Telemetry rhythm unchanged sinus rhythm with a bundle branch block. Variable heart rate noted from mid 40's to the upper 50's when symptoms reported.Patient exercised on 2/lmin oxygen  saturation 100% on 2l/min. Oxygen  increased to 3l/min. Chest pain stopped with rest. Jon then reported feeling very lightheaded walking back to room. Heart rate noted in the 70's/Blood pressure 118/70. Azlynn reported that she only had a few sips of water with her medications this morning. Luda did eat breakfast. Repeat blood pressure 128/70. Will consult with onsite provider Katlyn West NP. Symptoms resolved with rest. Jerriann says that she feels light headed almost on a daily basis at home.   Onsite provider Katlyn West NP notified and reviewed today's ECG tracings and spoke with the patient. No new orders received. Valbona has an appointment at the heart failure clinic in a few minutes for lab work.   Notified HF clinic about today's events. Spoke with [employer] CMA.   Instructed Aftyn to make sure she drinks at least 8-16 ounces of water before coming to cardiac rehab for exercise. Patient states understanding.   Raylei left cardiac rehab without further complaints or symptoms. Will continue to monitor the patient throughout  the program. Hadassah Elpidio Quan RN BSN

## 2023-03-11 ENCOUNTER — Telehealth (HOSPITAL_COMMUNITY): Payer: Self-pay

## 2023-03-11 DIAGNOSIS — I5042 Chronic combined systolic (congestive) and diastolic (congestive) heart failure: Secondary | ICD-10-CM

## 2023-03-11 NOTE — Telephone Encounter (Signed)
-----   Message from Peder Bourdon sent at 03/10/2023  1:01 PM EST ----- Stop KCl completely.  Hold spironolactone  for 2 days then restart.  BMET in 1 week.

## 2023-03-11 NOTE — Telephone Encounter (Signed)
 Patient's kcl medication has been changed and updated in pt's med list. In addition, pt's labs has been ordered and appointment scheduled.Pt aware, agreeable, and verbalized understanding.

## 2023-03-14 ENCOUNTER — Encounter (HOSPITAL_COMMUNITY)
Admission: RE | Admit: 2023-03-14 | Discharge: 2023-03-14 | Disposition: A | Discharge status: Home / Self Care | Payer: Medicare Other | Source: Ambulatory Visit | Attending: Cardiology | Admitting: Cardiology

## 2023-03-14 DIAGNOSIS — Z9861 Coronary angioplasty status: Secondary | ICD-10-CM

## 2023-03-14 DIAGNOSIS — I5042 Chronic combined systolic (congestive) and diastolic (congestive) heart failure: Secondary | ICD-10-CM | POA: Diagnosis not present

## 2023-03-14 LAB — GLUCOSE, CAPILLARY
Glucose-Capillary: 157 mg/dL — ABNORMAL HIGH (ref 70–99)
Glucose-Capillary: 169 mg/dL — ABNORMAL HIGH (ref 70–99)

## 2023-03-14 NOTE — Progress Notes (Signed)
 Daily Session Note  Patient Details  Name: [name] [name] [name]: [id number] Date of Birth: [birth year] Referring Provider:   Flowsheet Row INTENSIVE CARDIAC REHAB ORIENT from 03/10/2023 in [facility]  Referring Provider Dr. Randene Bustard, MD       Encounter Date: 03/14/2023  Check In:  Session Check In - 03/14/23 1251       Check-In   Supervising physician immediately available to respond to emergencies [facility] - Physician supervision    Physician(s) Slater Duncan NP    Location [facility]    Staff Present Pablo Boards BS, ACSM-CEP, Exercise Physiologist;Veronika Heard, RN, Malvin Searing, MS, ACSM-CEP, CCRP, Exercise Physiologist;Olinty [name] Kays, MS, ACSM-CEP, Exercise Physiologist    Virtual Visit No    Medication changes reported     Yes    Comments D/C Patassium, D/C spirolactone  just this past sat/sun.    Fall or balance concerns reported    No    Tobacco Cessation No Change    Warm-up and Cool-down Performed as group-led instruction    Resistance Training Performed Yes    VAD Patient? No    PAD/SET Patient? No      Pain Assessment   Currently in Pain? No/denies    Multiple Pain Sites No             Capillary Blood Glucose: Results for orders placed or performed during the hospital encounter of 03/10/23 (from the past 24 hours)  Glucose, capillary     Status: Abnormal   Collection Time: 03/14/23 12:45 PM  Result Value Ref Range   Glucose-Capillary 169 (H) 70 - 99 mg/dL  Glucose, capillary     Status: Abnormal   Collection Time: 03/14/23  1:27 PM  Result Value Ref Range   Glucose-Capillary 157 (H) 70 - 99 mg/dL   *Note: Due to a large number of results and/or encounters for the requested time period, some results have not been displayed. A complete set of results can be found in Results Review.     Exercise Prescription Changes - 03/14/23 1400       Response to Exercise   Blood  Pressure (Admit) 128/62    Blood Pressure (Exercise) 122/78    Blood Pressure (Exit) 114/76    Heart Rate (Admit) 45 bpm    Heart Rate (Exercise) 71 bpm    Heart Rate (Exit) 49 bpm    Oxygen  Saturation (Exercise) 97 %    Rating of Perceived Exertion (Exercise) 10    Perceived Dyspnea (Exercise) 1    Symptoms SOB    Comments Pt's first day in the [facility]2 program    Duration Progress to 30 minutes of  aerobic without signs/symptoms of physical distress    Intensity THRR unchanged      Progression   Progression Continue to progress workloads to maintain intensity without signs/symptoms of physical distress.    Average METs 1.4      Resistance Training   Training Prescription Yes    Weight 1 lb    Reps 10-15    Time 10 Minutes      Oxygen    Oxygen  Continuous    Liters 2      NuStep   Level 1    SPM 71    Minutes 18    METs 1.4      Oxygen    Maintain Oxygen  Saturation 88% or higher  Social History   Tobacco Use  Smoking Status Never  Smokeless Tobacco Never    Goals Met:  Strength training completed today  Goals Unmet:  Not Applicable  Comments: Pt started cardiac rehab today.  Pt tolerated light exercise without difficulty. VSS, telemetry-Sinus Rhythm, bundle branch block , asymptomatic. [name] did fair with exercise. nonsusutained sinus brady. Oxygen  saturations and CBG's stable. Jacqlyn Matas a rollator for stabilty. [place] exercised on oxygen  and was encouraged to take frequent rest breaks. Peytin is deconditioned.  Medication list reconciled. Medication changes noted.  Pt denies barriers to medicaiton compliance.  PSYCHOSOCIAL ASSESSMENT:  PHQ-21.   Xavier says that she is depressed. The anitdepressants that Temilola is taking is helping control her depression. Jailei says the sleep medication she takes helps her go to sleep at night. Sakura admits to having difficulty staying asleep. Juletta says she feels at time she is a burdeon to her family as Tyranny  does not drive any longer. Tehra denies the suicidal ideations. Angal denies the need for counselling at this time. Will review quality of life questionnaire in the upcoming week.  Pt enjoys reading.   Pt oriented to exercise equipment and routine.    Understanding verbalized. [place] RN BSN    Dr. Gaylyn Keas is Medical Director for Cardiac Rehab at [facility].

## 2023-03-16 ENCOUNTER — Encounter (HOSPITAL_COMMUNITY)
Admission: RE | Admit: 2023-03-16 | Discharge: 2023-03-16 | Disposition: A | Discharge status: Home / Self Care | Payer: Medicare Other | Source: Ambulatory Visit | Attending: Cardiology | Admitting: Cardiology

## 2023-03-16 ENCOUNTER — Ambulatory Visit (HOSPITAL_COMMUNITY)
Admission: RE | Admit: 2023-03-16 | Discharge: 2023-03-16 | Disposition: A | Discharge status: Home / Self Care | Payer: Medicare Other | Source: Ambulatory Visit | Attending: Cardiology | Admitting: Cardiology

## 2023-03-16 DIAGNOSIS — Z9861 Coronary angioplasty status: Secondary | ICD-10-CM

## 2023-03-16 DIAGNOSIS — I5042 Chronic combined systolic (congestive) and diastolic (congestive) heart failure: Secondary | ICD-10-CM | POA: Insufficient documentation

## 2023-03-16 LAB — BASIC METABOLIC PANEL
Anion gap: 11 (ref 5–15)
BUN: 27 mg/dL — ABNORMAL HIGH (ref 8–23)
CO2: 25 mmol/L (ref 22–32)
Calcium: 9.1 mg/dL (ref 8.9–10.3)
Chloride: 105 mmol/L (ref 98–111)
Creatinine, Ser: 1.22 mg/dL — ABNORMAL HIGH (ref 0.44–1.00)
GFR, Estimated: 46 mL/min — ABNORMAL LOW (ref >60)
Glucose, Bld: 151 mg/dL — ABNORMAL HIGH (ref 70–99)
Potassium: 5.2 mmol/L — ABNORMAL HIGH (ref 3.5–5.1)
Sodium: 141 mmol/L (ref 135–145)

## 2023-03-16 LAB — GLUCOSE, CAPILLARY
Glucose-Capillary: 136 mg/dL — ABNORMAL HIGH (ref 70–99)
Glucose-Capillary: 130 mg/dL — ABNORMAL HIGH (ref 70–99)

## 2023-03-17 ENCOUNTER — Telehealth (HOSPITAL_COMMUNITY): Payer: Self-pay

## 2023-03-17 DIAGNOSIS — I5042 Chronic combined systolic (congestive) and diastolic (congestive) heart failure: Secondary | ICD-10-CM

## 2023-03-17 NOTE — Progress Notes (Signed)
QUALITY OF LIFE SCORE REVIEW  Pt completed Quality of Life survey as a participant in Cardiac Rehab.  Scores 19.0 or below are considered low.  Pt score very low in several areas Overall 15.53, Health and Function 8.53, socioeconomic 27.50, physiological and spiritual 12.0, family 24.0. Patient quality of life slightly altered by physical constraints which limits ability to perform as prior to recent cardiac illness.  [name] [name] says that she is very dissatisfied with her health due to her various health conditions. [name] [name] says that she has chest pain daily with exertional activities.[name] [name] says her chest pain has improved since she had her PCI in October. [name] [name] continues to have low energy. [name] [name] says she is limited to what she can do at home. [name] [name] says she relies on her husband to drive her to her various appointments and to the grocery store. [name] [name] says that the antidepressant she is taking is controlling her ongoing depression. Offered emotional support and reassurance.  Will continue to monitor and intervene as necessary. [name] [name] says that her primary care provider Dr Duaine Dredge is aware of her depression. [name] [name] asked that I not forward this quality of life survey to him at this time. [name] [name] has participated in cardiac rehab multiple times. [name] [name] hopes that participating in cardiac rehab again will help to increase her endurance. Will continue to monitor the patient throughout  the program. [name] Headings RN BSN

## 2023-03-17 NOTE — Telephone Encounter (Signed)
-----   Message from Marca Ancona sent at 03/16/2023  4:59 PM EST ----- K is coming down.  Low K diet and repeat BMET 10 days.

## 2023-03-17 NOTE — Telephone Encounter (Signed)
Patient's labs has been placed and appointment scheduled.  Pt aware, agreeable, and verbalized understanding

## 2023-03-21 ENCOUNTER — Encounter (HOSPITAL_COMMUNITY)
Admission: RE | Admit: 2023-03-21 | Discharge: 2023-03-21 | Disposition: A | Discharge status: Home / Self Care | Payer: Medicare Other | Source: Ambulatory Visit | Attending: Cardiology | Admitting: Cardiology

## 2023-03-21 DIAGNOSIS — Z9861 Coronary angioplasty status: Secondary | ICD-10-CM

## 2023-03-21 DIAGNOSIS — I5042 Chronic combined systolic (congestive) and diastolic (congestive) heart failure: Secondary | ICD-10-CM | POA: Diagnosis not present

## 2023-03-21 LAB — GLUCOSE, CAPILLARY: Glucose-Capillary: 135 mg/dL — ABNORMAL HIGH (ref 70–99)

## 2023-03-22 LAB — GLUCOSE, CAPILLARY: Glucose-Capillary: 156 mg/dL — ABNORMAL HIGH (ref 70–99)

## 2023-03-23 ENCOUNTER — Encounter (HOSPITAL_COMMUNITY)
Admission: RE | Admit: 2023-03-23 | Discharge: 2023-03-23 | Disposition: A | Discharge status: Home / Self Care | Payer: Medicare Other | Source: Ambulatory Visit | Attending: Cardiology | Admitting: Cardiology

## 2023-03-23 DIAGNOSIS — Z9861 Coronary angioplasty status: Secondary | ICD-10-CM

## 2023-03-23 DIAGNOSIS — I5042 Chronic combined systolic (congestive) and diastolic (congestive) heart failure: Secondary | ICD-10-CM | POA: Diagnosis not present

## 2023-03-28 ENCOUNTER — Ambulatory Visit (HOSPITAL_COMMUNITY)
Admission: RE | Admit: 2023-03-28 | Discharge: 2023-03-28 | Disposition: A | Discharge status: Home / Self Care | Payer: Medicare Other | Source: Ambulatory Visit | Attending: Cardiology | Admitting: Cardiology

## 2023-03-28 ENCOUNTER — Encounter (HOSPITAL_COMMUNITY)
Admission: RE | Admit: 2023-03-28 | Discharge: 2023-03-28 | Disposition: A | Discharge status: Home / Self Care | Payer: Medicare Other | Source: Ambulatory Visit | Attending: Cardiology

## 2023-03-28 DIAGNOSIS — Z9861 Coronary angioplasty status: Secondary | ICD-10-CM

## 2023-03-28 DIAGNOSIS — I5042 Chronic combined systolic (congestive) and diastolic (congestive) heart failure: Secondary | ICD-10-CM | POA: Diagnosis present

## 2023-03-28 LAB — BASIC METABOLIC PANEL
Anion gap: 14 (ref 5–15)
BUN: 26 mg/dL — ABNORMAL HIGH (ref 8–23)
CO2: 22 mmol/L (ref 22–32)
Calcium: 9.3 mg/dL (ref 8.9–10.3)
Chloride: 105 mmol/L (ref 98–111)
Creatinine, Ser: 1.57 mg/dL — ABNORMAL HIGH (ref 0.44–1.00)
GFR, Estimated: 34 mL/min — ABNORMAL LOW (ref >60)
Glucose, Bld: 162 mg/dL — ABNORMAL HIGH (ref 70–99)
Potassium: 4.9 mmol/L (ref 3.5–5.1)
Sodium: 141 mmol/L (ref 135–145)

## 2023-03-29 ENCOUNTER — Telehealth (HOSPITAL_COMMUNITY): Payer: Self-pay

## 2023-03-29 DIAGNOSIS — I480 Paroxysmal atrial fibrillation: Secondary | ICD-10-CM

## 2023-03-29 MED ORDER — POTASSIUM CHLORIDE CRYS ER 10 MEQ PO TBCR: EXTENDED_RELEASE_TABLET | ORAL | 5 refills | Status: AC

## 2023-03-29 MED ORDER — TORSEMIDE 20 MG PO TABS: ORAL_TABLET | ORAL | 5 refills | Status: DC

## 2023-03-29 NOTE — Telephone Encounter (Signed)
-----   Message from [employer] sent at 03/28/2023  4:55 PM EST ----- Creatinine higher, decrease torsemide and KCl to every 3rd day instead of every other day.  BMET 2 wks.

## 2023-03-29 NOTE — Telephone Encounter (Signed)
 Patient's medication list for torsemide and kcl has been changed and updated to reflect the change. In addition, pt's lab appointment placed and appointment scheduled. Pt aware, agreeable, and verbalized understanding.

## 2023-03-30 ENCOUNTER — Encounter (HOSPITAL_COMMUNITY)
Admission: RE | Admit: 2023-03-30 | Discharge: 2023-03-30 | Disposition: A | Discharge status: Home / Self Care | Payer: Medicare Other | Source: Ambulatory Visit | Attending: Cardiology

## 2023-03-30 DIAGNOSIS — I5042 Chronic combined systolic (congestive) and diastolic (congestive) heart failure: Secondary | ICD-10-CM | POA: Diagnosis not present

## 2023-03-30 DIAGNOSIS — Z9861 Coronary angioplasty status: Secondary | ICD-10-CM

## 2023-04-04 ENCOUNTER — Encounter (HOSPITAL_COMMUNITY)
Admission: RE | Admit: 2023-04-04 | Discharge: 2023-04-04 | Disposition: A | Discharge status: Home / Self Care | Payer: Medicare Other | Source: Ambulatory Visit | Attending: Cardiology | Admitting: Cardiology

## 2023-04-04 DIAGNOSIS — Z48812 Encounter for surgical aftercare following surgery on the circulatory system: Secondary | ICD-10-CM | POA: Insufficient documentation

## 2023-04-04 DIAGNOSIS — Z9861 Coronary angioplasty status: Secondary | ICD-10-CM | POA: Diagnosis present

## 2023-04-05 NOTE — Progress Notes (Signed)
 Cardiac Individual Treatment Plan  Patient Details  Name: NARE GASPARI [name]: [id number] Date of Birth: [birth year] Referring Provider:   Flowsheet Row INTENSIVE CARDIAC REHAB ORIENT from 03/10/2023 in [facility]  Referring Provider Dr. Bryan Lemma, MD       Initial Encounter Date:  Flowsheet Row INTENSIVE CARDIAC REHAB ORIENT from 03/10/2023 in [facility], [facility] Lung [facility]  Date 03/10/23       Visit Diagnosis: 11/24/22 S/P PTCA RCA  Patient's Home Medications on Admission:  Current Outpatient Medications:    acetaminophen (TYLENOL) 500 MG tablet, Take 500 mg by mouth every 6 (six) hours as needed for mild pain, moderate pain, fever or headache., Disp: , Rfl:    albuterol (VENTOLIN HFA) 108 (90 Base) MCG/ACT inhaler, Inhale 2 puffs into the lungs every 4 (four) hours as needed for wheezing or shortness of breath. , Disp: , Rfl:    ALPRAZolam (XANAX) 0.5 MG tablet, Take 0.5-1 mg by mouth See admin instructions. Take 0.5 mg  morning at lunch and 1 mg at bedtime, Disp: , Rfl:    amLODipine (NORVASC) 2.5 MG tablet, Take 1 tablet (2.5 mg total) by mouth at bedtime., Disp: 30 tablet, Rfl: 11   apixaban (ELIQUIS) 5 MG TABS tablet, Take 1 tablet (5 mg total) by mouth 2 (two) times daily., Disp: 180 tablet, Rfl: 3   B Complex-C (B-COMPLEX WITH VITAMIN C) tablet, Take 1 tablet by mouth in the morning., Disp: , Rfl:    baclofen (LIORESAL) 10 MG tablet, Take 10 mg by mouth at bedtime., Disp: , Rfl:    bisacodyl (DULCOLAX) 5 MG EC tablet, Take 5 mg by mouth daily as needed for moderate constipation., Disp: , Rfl:    buPROPion (WELLBUTRIN XL) 300 MG 24 hr tablet, Take 300 mg by mouth daily., Disp: , Rfl:    carboxymethylcellulose (REFRESH PLUS) 0.5 % SOLN, Place 1 drop into both eyes in the morning, at noon, and at bedtime., Disp: , Rfl:    Cholecalciferol (VITAMIN D3) 50 MCG (2000 UT) capsule, Take  2,000 Units by mouth daily in the afternoon., Disp: , Rfl:    clopidogrel (PLAVIX) 75 MG tablet, Take 75 mg by mouth every morning., Disp: , Rfl:    DULoxetine (CYMBALTA) 30 MG capsule, Take 1 capsule (30 mg total) by mouth daily., Disp: 30 capsule, Rfl: 0   ezetimibe (ZETIA) 10 MG tablet, Take 1 tablet (10 mg total) by mouth at bedtime., Disp: 90 tablet, Rfl: 3   fluocinonide cream (LIDEX) 0.05 %, Apply 1 application. topically 2 (two) times daily as needed (irritation)., Disp: , Rfl:    Fluticasone-Salmeterol (ADVAIR) 250-50 MCG/DOSE AEPB, Inhale 1 puff into the lungs daily., Disp: , Rfl:    gabapentin (NEURONTIN) 600 MG tablet, Take 600-1,200 mg by mouth See admin instructions. Take 1 tablet (600 mg) by mouth in the morning & take 2 tablets (1200 mg) by mouth at bedtime, Disp: , Rfl:    inclisiran (LEQVIO) 284 MG/1.5ML SOSY injection, Inject 284 mg into the skin every 6 (six) months., Disp: , Rfl:    Insulin Glargine (LANTUS SOLOSTAR) 100 UNIT/ML Solostar Pen, Inject 0-8 Units into the skin at bedtime. Per sliding scale, Disp: , Rfl:    insulin lispro (HUMALOG) 100 UNIT/ML KwikPen, Inject 4-6 Units into the skin See admin instructions. Sliding scale Inject 8 units subcutaneously prior to breakfast and supper; add 4 units for CBG >200, Disp: ,  Rfl:    loperamide (IMODIUM A-D) 2 MG tablet, Take 2 mg by mouth 4 (four) times daily as needed for diarrhea or loose stools., Disp: , Rfl:    Multiple Vitamin (MULTIVITAMIN WITH MINERALS) TABS tablet, Take 1 tablet by mouth every morning. Centrum - Women over 50, Disp: , Rfl:    Multiple Vitamins-Minerals (OCUVITE EYE HEALTH FORMULA) CAPS, Take 1 capsule by mouth every morning., Disp: , Rfl:    nitroGLYCERIN (NITROSTAT) 0.3 MG SL tablet, DISSOLVE 1 TABLET UNDER TONGUE EVERY 5 MINUTES FOR UP TO 3 DOSES AS NEEDED FOR CHEST PAIN, Disp: 100 tablet, Rfl: 0   ondansetron (ZOFRAN-ODT) 4 MG disintegrating tablet, Take 4 mg by mouth 2 (two) times daily., Disp: , Rfl:     OXYGEN, Inhale 2 L into the lungs at bedtime. Additional during the day, Disp: , Rfl:    pantoprazole (PROTONIX) 40 MG tablet, Take 1 tablet (40 mg total) by mouth daily., Disp: 30 tablet, Rfl: 0   potassium chloride (KLOR-CON M) 10 MEQ tablet, Patient takes 1 tablet by mouth every 3rd day as directed, Disp: 15 tablet, Rfl: 5   pyridOXINE (VITAMIN B6) 100 MG tablet, Take 100 mg by mouth every morning., Disp: , Rfl:    ranolazine (RANEXA) 1000 MG SR tablet, Take 1 tablet (1,000 mg total) by mouth 2 (two) times daily., Disp: 180 tablet, Rfl: 3   rosuvastatin (CRESTOR) 40 MG tablet, Take 1 tablet (40 mg total) by mouth daily., Disp: 90 tablet, Rfl: 3   sacubitril-valsartan (ENTRESTO) 97-103 MG, Take 1 tablet by mouth 2 (two) times daily., Disp: 180 tablet, Rfl: 3   Semaglutide, 2 MG/DOSE, (OZEMPIC, 2 MG/DOSE,) 8 MG/3ML SOPN, Inject 2 mg into the skin once a week., Disp: 3 mL, Rfl: 11   spironolactone (ALDACTONE) 25 MG tablet, Take 1 tablet (25 mg total) by mouth daily., Disp: 90 tablet, Rfl: 3   torsemide (DEMADEX) 20 MG tablet, Take 1 tablet by mouth every 3rd day as directed., Disp: 15 tablet, Rfl: 5   traZODone (DESYREL) 50 MG tablet, Take 100-150 mg by mouth at bedtime., Disp: , Rfl:    vitamin B-12 (CYANOCOBALAMIN) 1000 MCG tablet, Take 1,000 mcg by mouth every morning. , Disp: , Rfl:    zolpidem (AMBIEN) 10 MG tablet, Take 10 mg by mouth at bedtime., Disp: , Rfl:   Past Medical History: Past Medical History:  Diagnosis Date   Anemia    hx   Anxiety    Asthma    Basal cell carcinoma 05/2014   "left shoulder"   Bundle branch block, left    chronic/notes 07/18/2013   CHF (congestive heart failure) (HCC)    Chronic insomnia 05/06/2015   Chronic kidney disease    frequency, sees dr Camille Bal every 4 to 6 months (01/16/2018)   Chronic lower back pain    Claustrophobia    Common migraine 05/14/2014   Coronary artery disease    MI in 2001, 2002, 2006, 2011, 2014   Depression     Diabetic peripheral neuropathy (HCC) 01/12/2019   GERD (gastroesophageal reflux disease)    H/O hiatal hernia    Headache    "at least 2/month" (01/16/2018)   Heart murmur    Hyperlipidemia    Hypertension    Memory change 05/14/2014   Migraine    "5-6/year"  (01/16/2018)   Obesity 01-2010   Obstructive sleep apnea    "can't wear machine; I have claustrophobia" (01/16/2018), states she had a 2nd sleep study and  does not have sleep apnea, her O2 decreases and now is on 2 L of O2 at night.   On home oxygen therapy    "2L at night and prn during daytime" (01/16/2018)   Osteoarthritis    "knees and hands" (01/16/2018)   Peripheral vascular disease (HCC)    ? numbness, tingling arms and legs   PONV (postoperative nausea and vomiting)    Stroke (HCC)    2014, 2015, 2016   Type II diabetes mellitus (HCC)    Ventral hernia    hx of    Tobacco Use: Social History   Tobacco Use  Smoking Status Never  Smokeless Tobacco Never    Labs: Review Flowsheet  More data exists      Latest Ref Rng & Units 04/20/2021 01/13/2022 08/26/2022 10/22/2022 10/30/2022  Labs for ITP Cardiac and Pulmonary Rehab  Cholestrol 0 - 200 mg/dL - 130  865  - 81   LDL (calc) 0 - 99 mg/dL - 784  83  - 25   HDL-C >40 mg/dL - 45  65  - 49   Trlycerides <150 mg/dL - 696  85  - 36   Hemoglobin A1c 4.8 - 5.6 % - - - 6.1  -  Bicarbonate 20.0 - 28.0 mmol/L 20.0 - 28.0 mmol/L 27.2  27.5  - - 28.2  28.7  -  TCO2 22 - 32 mmol/L 22 - 32 mmol/L 29  29  - - 30  30  -  O2 Saturation % % 79  80  - - 68  64  -    Details       Multiple values from one day are sorted in reverse-chronological order         Capillary Blood Glucose: Lab Results  Component Value Date   GLUCAP 156 (H) 03/21/2023   GLUCAP 135 (H) 03/21/2023   GLUCAP 130 (H) 03/16/2023   GLUCAP 136 (H) 03/16/2023   GLUCAP 157 (H) 03/14/2023     Exercise Target Goals: Exercise Program Goal: Individual exercise prescription set using results  from initial 6 min walk test and THRR while considering  patient's activity barriers and safety.   Exercise Prescription Goal: Initial exercise prescription builds to 30-45 minutes a day of aerobic activity, 2-3 days per week.  Home exercise guidelines will be given to patient during program as part of exercise prescription that the participant will acknowledge.  Activity Barriers & Risk Stratification:  Activity Barriers & Cardiac Risk Stratification - 03/10/23 1217       Activity Barriers & Cardiac Risk Stratification   Activity Barriers Joint Problems;Assistive Device;Deconditioning;Muscular Weakness;Arthritis;Back Problems;Shortness of Breath;Decreased Ventricular Function;Chest Pain/Angina;Balance Concerns;History of Falls;Neck/Spine Problems    Cardiac Risk Stratification High             6 Minute Walk:  6 Minute Walk     Row Name 03/10/23 1209         6 Minute Walk   Phase Initial     Distance 1312 feet     Walk Time 6 minutes     # of Rest Breaks 1     MPH 2.48     METS 1.4     RPE 11     Perceived Dyspnea  1     VO2 Peak 4.89     Symptoms Yes (comment)     Comments Supplemental Oxygen during walk test prior to CP. Right side CP 4 mins in, then radiated to right side, discontinued Stepper test 5  mins in due to cp 2/10. CP resolved within 2 mins. Back to intake room pt became dizzy, eyes closed and unsteady, sat down BP 118/70, H2o recheck 5 mins 128/70. Dissy spell resolved within 1 min. AVG watts: 13, AVG MET's:1.5, Total steps: 471, AVG km: 0.40, AVG SPM: 89     Resting HR 50 bpm     Resting BP 142/64     Resting Oxygen Saturation  96 %     Exercise Oxygen Saturation  during 6 min walk 100 %     Max Ex. HR 62 bpm     Max Ex. BP 124/70     2 Minute Post BP 118/70              Oxygen Initial Assessment:   Oxygen Re-Evaluation:   Oxygen Discharge (Final Oxygen Re-Evaluation):   Initial Exercise Prescription:  Initial Exercise Prescription -  03/10/23 1200       Date of Initial Exercise RX and Referring Provider   Date 03/10/23    Referring Provider Dr. Bryan Lemma, MD    Expected Discharge Date 06/01/23      Oxygen   Oxygen Intermittent    Liters 2    Maintain Oxygen Saturation 88% or higher      NuStep   Level 1    SPM 60    Minutes 20    METs 1.3      Prescription Details   Frequency (times per week) 2    Duration Progress to 30 minutes of continuous aerobic without signs/symptoms of physical distress      Intensity   THRR 40-80% of Max Heartrate 58-116    Ratings of Perceived Exertion 11-13    Perceived Dyspnea 0-4      Progression   Progression Continue progressive overload as per policy without signs/symptoms or physical distress.      Resistance Training   Training Prescription Yes    Weight 1    Reps 10-15             Perform Capillary Blood Glucose checks as needed.  Exercise Prescription Changes:   Exercise Prescription Changes     Row Name 03/14/23 1400 04/04/23 1430           Response to Exercise   Blood Pressure (Admit) 128/62 126/68      Blood Pressure (Exercise) 122/78 124/60      Blood Pressure (Exit) 114/76 120/60      Heart Rate (Admit) 45 bpm 63 bpm      Heart Rate (Exercise) 71 bpm 566 bpm      Heart Rate (Exit) 49 bpm 56 bpm      Oxygen Saturation (Admit) -- 99 %      Oxygen Saturation (Exercise) 97 % 98 %      Oxygen Saturation (Exit) -- 99 %      Rating of Perceived Exertion (Exercise) 10 11      Perceived Dyspnea (Exercise) 1 1      Symptoms SOB SOB      Comments Pt's first day in the CRP2 program Reviewed METs      Duration Progress to 30 minutes of  aerobic without signs/symptoms of physical distress Progress to 30 minutes of  aerobic without signs/symptoms of physical distress      Intensity THRR unchanged THRR unchanged        Progression   Progression Continue to progress workloads to maintain intensity without signs/symptoms of physical distress.  Continue to progress workloads  to maintain intensity without signs/symptoms of physical distress.      Average METs 1.4 1.7        Resistance Training   Training Prescription Yes Yes      Weight 1 lb 1 lb      Reps 10-15 10-15      Time 10 Minutes 10 Minutes        Oxygen   Oxygen Continuous Continuous      Liters 2 2        NuStep   Level 1 1      SPM 71 98      Minutes 18 26      METs 1.4 1.7        Oxygen   Maintain Oxygen Saturation 88% or higher 88% or higher               Exercise Comments:   Exercise Comments     Row Name 03/14/23 1411 04/04/23 1430         Exercise Comments Pt's first day in the CRP2 program. Pt is very deconditioned. Pt was able to complete 18 minutes on the nustep with several breaks. Pt denies any angina with activity. Pt did have SOB with RPD =1 on 2L/min via n/c (SaO2 = 97%).  Will continue to use Nustep as her exercise modality. Pt ambulates with rolling walker. Reviewed METs. Saidah is making slow progress. Her METs have increased and she is now up to 26 minutes form 18 on day one. She continues to take breaks to rest.               Exercise Goals and Review:   Exercise Goals Re-Evaluation :  Exercise Goals Re-Evaluation     Row Name 03/14/23 1409             Exercise Goal Re-Evaluation   Exercise Goals Review Increase Physical Activity;Increase Strength and Stamina;Able to understand and use rate of perceived exertion (RPE) scale;Knowledge and understanding of Target Heart Rate Range (THRR);Understanding of Exercise Prescription       Comments Pt's first day in the CRP2 program. Pt understands the exercise Rx, THRR and RPE scale.       Expected Outcomes Will continue to montior and progress exercise workloads as tolerated.                Discharge Exercise Prescription (Final Exercise Prescription Changes):  Exercise Prescription Changes - 04/04/23 1430       Response to Exercise   Blood Pressure (Admit) 126/68     Blood Pressure (Exercise) 124/60    Blood Pressure (Exit) 120/60    Heart Rate (Admit) 63 bpm    Heart Rate (Exercise) 566 bpm    Heart Rate (Exit) 56 bpm    Oxygen Saturation (Admit) 99 %    Oxygen Saturation (Exercise) 98 %    Oxygen Saturation (Exit) 99 %    Rating of Perceived Exertion (Exercise) 11    Perceived Dyspnea (Exercise) 1    Symptoms SOB    Comments Reviewed METs    Duration Progress to 30 minutes of  aerobic without signs/symptoms of physical distress    Intensity THRR unchanged      Progression   Progression Continue to progress workloads to maintain intensity without signs/symptoms of physical distress.    Average METs 1.7      Resistance Training   Training Prescription Yes    Weight 1 lb    Reps 10-15  Time 10 Minutes      Oxygen   Oxygen Continuous    Liters 2      NuStep   Level 1    SPM 98    Minutes 26    METs 1.7      Oxygen   Maintain Oxygen Saturation 88% or higher             Nutrition:  Target Goals: Understanding of nutrition guidelines, daily intake of sodium 1500mg , cholesterol 200mg , calories 30% from fat and 7% or less from saturated fats, daily to have 5 or more servings of fruits and vegetables.  Biometrics:  Pre Biometrics - 03/10/23 1221       Pre Biometrics   Height 5\' 1"  (1.549 m)    Weight 102.5 kg    Waist Circumference 45 inches    Hip Circumference 55 inches    Waist to Hip Ratio 0.82 %    BMI (Calculated) 42.72    Triceps Skinfold 44 mm    % Body Fat 54.5 %    Grip Strength 18 kg    Flexibility --   Bacck pain and issues   Single Leg Stand --   Pt uses walker             Nutrition Therapy Plan and Nutrition Goals:  Nutrition Therapy & Goals - 04/04/23 1521       Nutrition Therapy   Diet Heart Healthy Diet    Drug/Food Interactions Statins/Certain Fruits      Personal Nutrition Goals   Nutrition Goal Patient to identify strategies for reducing cardiovascular risk by attending the  [employer] education and nutrition series weekly.   goal in progress.   Personal Goal #2 Patient to improve diet quality by using the plate method as a guide for meal planning to include lean protein/plant protein, fruits, vegetables, whole grains, nonfat dairy as part of a well-balanced diet.   goal in progress.   Personal Goal #3 Patient to identify strategies for weight loss of 0.5-2.0# per week.   goal in progress.   Comments Goals in progress. Shikara has completed cardiac rehab >4x in the past. She has medical history of CAD, CABG x5, CVA, CHF, carotid stent, DM2, HTN, OSA. Lipids are at goal. A1c is well controlled at 6.1, She is down 2.2# since starting with our program. Patient will continue to benefit from participation in intensive cardiac rehab for nutrition, exercise, and lifestyle modification.      Intervention Plan   Intervention Prescribe, educate and counsel regarding individualized specific dietary modifications aiming towards targeted core components such as weight, hypertension, lipid management, diabetes, heart failure and other comorbidities.;Nutrition handout(s) given to patient.    Expected Outcomes Short Term Goal: Understand basic principles of dietary content, such as calories, fat, sodium, cholesterol and nutrients.;Long Term Goal: Adherence to prescribed nutrition plan.             Nutrition Assessments:  MEDIFICTS Score Key: >=70 Need to make dietary changes  40-70 Heart Healthy Diet <= 40 Therapeutic Level Cholesterol Diet   Flowsheet Row INTENSIVE CARDIAC REHAB from 03/14/2023 in [facility]  Picture Your Plate Total Score on Admission 74      Picture Your Plate Scores: <94 Unhealthy dietary pattern with much room for improvement. 41-50 Dietary pattern unlikely to meet recommendations for good health and room for improvement. 51-60 More healthful dietary pattern, with some room for improvement.  >60  Healthy  dietary pattern, although there may be some specific behaviors that could be improved.    Nutrition Goals Re-Evaluation:  Nutrition Goals Re-Evaluation     Row Name 04/04/23 1521             Goals   Current Weight 223 lb 12.3 oz (101.5 kg)       Comment lipids WNL, LDL 25, Cr 1.57, GFR 24, Lpa 229, A1c 6.1       Expected Outcome Goals in progress. Sabriah has completed cardiac rehab >4x in the past. She has medical history of CAD, CABG x5, CVA, CHF, carotid stent, DM2, HTN, OSA. Lipids are at goal. A1c is well controlled at 6.1, She is down 2.2# since starting with our program. Patient will continue to benefit from participation in intensive cardiac rehab for nutrition, exercise, and lifestyle modification.                Nutrition Goals Re-Evaluation:  Nutrition Goals Re-Evaluation     Row Name 04/04/23 1521             Goals   Current Weight 223 lb 12.3 oz (101.5 kg)       Comment lipids WNL, LDL 25, Cr 1.57, GFR 24, Lpa 229, A1c 6.1       Expected Outcome Goals in progress. Sherrilynn has completed cardiac rehab >4x in the past. She has medical history of CAD, CABG x5, CVA, CHF, carotid stent, DM2, HTN, OSA. Lipids are at goal. A1c is well controlled at 6.1, She is down 2.2# since starting with our program. Patient will continue to benefit from participation in intensive cardiac rehab for nutrition, exercise, and lifestyle modification.                Nutrition Goals Discharge (Final Nutrition Goals Re-Evaluation):  Nutrition Goals Re-Evaluation - 04/04/23 1521       Goals   Current Weight 223 lb 12.3 oz (101.5 kg)    Comment lipids WNL, LDL 25, Cr 1.57, GFR 24, Lpa 229, A1c 6.1    Expected Outcome Goals in progress. Greta has completed cardiac rehab >4x in the past. She has medical history of CAD, CABG x5, CVA, CHF, carotid stent, DM2, HTN, OSA. Lipids are at goal. A1c is well controlled at 6.1, She is down 2.2# since starting with our program. Patient will  continue to benefit from participation in intensive cardiac rehab for nutrition, exercise, and lifestyle modification.             Psychosocial: Target Goals: Acknowledge presence or absence of significant depression and/or stress, maximize coping skills, provide positive support system. Participant is able to verbalize types and ability to use techniques and skills needed for reducing stress and depression.  Initial Review & Psychosocial Screening:  Initial Psych Review & Screening - 03/10/23 1037       Initial Review   Current issues with Current Depression;History of Depression;Current Sleep Concerns;Current Psychotropic Meds;Current Anxiety/Panic;Current Stress Concerns    Source of Stress Concerns Chronic Illness;Family;Transportation;Unable to participate in former interests or hobbies;Unable to perform yard/household activities    Comments Calin says that she is depresseed. The anitdepressants that Harrison is taking is helping control her depression. Persia says the sleep medication she takes helps her go to sleep. Soriyah admits to having difficulty staying asleep. Jashira says she feels at time she is a burdeon to her family. Gabrielly does not drive any longer. Laneisha relies on her husband Onalee Hua to take her out to appointments, Doctor's appointments,  ETC. Alya denies the suicidal ideations. Angal denies the need for counselling at this time.      Family Dynamics   Good Support System? Yes   Larisha has her husband Onalee Hua, her daughter and two grandchildren for support.   Comments Shadie will have controlled or decreased depression upon completion of cardiac rehab.      Barriers   Psychosocial barriers to participate in program The patient should benefit from training in stress management and relaxation.;Psychosocial barriers identified (see note)      Screening Interventions   Interventions Encouraged to exercise;To provide support and resources with identified psychosocial  needs;Provide feedback about the scores to participant    Expected Outcomes Long Term Goal: Stressors or current issues are controlled or eliminated.;Short Term goal: Identification and review with participant of any Quality of Life or Depression concerns found by scoring the questionnaire.;Long Term goal: The participant improves quality of Life and PHQ9 Scores as seen by post scores and/or verbalization of changes             Quality of Life Scores:  Quality of Life - 03/10/23 1222       Quality of Life   Select Quality of Life      Quality of Life Scores   Health/Function Pre 8.53 %    Socioeconomic Pre 27.5 %    Psych/Spiritual Pre 12 %    Family Pre 24 %    GLOBAL Pre 15.53 %            Scores of 19 and below usually indicate a poorer quality of life in these areas.  A difference of  2-3 points is a clinically meaningful difference.  A difference of 2-3 points in the total score of the Quality of Life Index has been associated with significant improvement in overall quality of life, self-image, physical symptoms, and general health in studies assessing change in quality of life.  PHQ-9: Review Flowsheet  More data exists      03/10/2023 12/30/2021 10/26/2021 10/15/2021 10/12/2018  Depression screen PHQ 2/9  Decreased Interest 3 3 3 1 2   Down, Depressed, Hopeless 2 1 3  0 1  PHQ - 2 Score 5 4 6 1 3   Altered sleeping 2 3 1  - 1  Tired, decreased energy 3 3 3  - 1  Change in appetite 2 1 0 - 0  Feeling bad or failure about yourself  3 3 3  - 1  Trouble concentrating 2 1 2  - 1  Moving slowly or fidgety/restless 2 0 1 - 0  Suicidal thoughts 2 0 0 - 0  PHQ-9 Score 21 15 16  - 7  Difficult doing work/chores Somewhat difficult Very difficult Very difficult - Somewhat difficult   Interpretation of Total Score  Total Score Depression Severity:  1-4 = Minimal depression, 5-9 = Mild depression, 10-14 = Moderate depression, 15-19 = Moderately severe depression, 20-27 = Severe  depression   Psychosocial Evaluation and Intervention:   Psychosocial Re-Evaluation:  Psychosocial Re-Evaluation     Row Name 03/14/23 1414 03/17/23 0804 03/31/23 1526         Psychosocial Re-Evaluation   Current issues with Current Anxiety/Panic;Current Stress Concerns;Current Depression;Current Sleep Concerns;History of Depression;Current Psychotropic Meds Current Anxiety/Panic;Current Stress Concerns;Current Depression;Current Sleep Concerns;History of Depression;Current Psychotropic Meds Current Anxiety/Panic;Current Stress Concerns;Current Depression;Current Sleep Concerns;History of Depression;Current Psychotropic Meds     Comments Will review quality of life in the upcoming week. Berdie denied having any increased concerns or stressors during exercise at cardiac rehab.  Quality of life questionnaire reviewed.Anishka says that she is very dissatisfied with her health due to her various health conditions. Janis says that she has chest pain daily with exertional activities.Ilaria says her chest pain has improved since she had her PCI in October. May continues to have low energy. Janesa says she is limited to what she can do at home. Onedia says she relies on her husband to drive her to her various appointments and to the grocery store. Lashaundra says that the antidepressant she is taking is controlling her ongoing depression. Latesia has not voiced any increased concerns or psychological stressors at cardiac rehab.     Expected Outcomes -- Ricketta will have controlled or decreased depression/ stressors upon completion of cardiac rehab. Brihany will have controlled or decreased depression/ stressors upon completion of cardiac rehab.     Interventions Stress management education;Relaxation education;Encouraged to attend Cardiac Rehabilitation for the exercise Stress management education;Relaxation education;Encouraged to attend Cardiac Rehabilitation for the exercise Stress management  education;Relaxation education;Encouraged to attend Cardiac Rehabilitation for the exercise     Continue Psychosocial Services  Follow up required by staff Follow up required by staff Follow up required by staff       Initial Review   Source of Stress Concerns Chronic Illness;Unable to perform yard/household activities;Unable to participate in former interests or hobbies Chronic Illness;Unable to perform yard/household activities;Unable to participate in former interests or hobbies Chronic Illness;Unable to perform yard/household activities;Unable to participate in former interests or hobbies     Comments Will contoinue to monitor and offer support as needed. Will contoinue to monitor and offer support as needed. Will contoinue to monitor and offer support as needed.              Psychosocial Discharge (Final Psychosocial Re-Evaluation):  Psychosocial Re-Evaluation - 03/31/23 1526       Psychosocial Re-Evaluation   Current issues with Current Anxiety/Panic;Current Stress Concerns;Current Depression;Current Sleep Concerns;History of Depression;Current Psychotropic Meds    Comments Vicky has not voiced any increased concerns or psychological stressors at cardiac rehab.    Expected Outcomes Chayce will have controlled or decreased depression/ stressors upon completion of cardiac rehab.    Interventions Stress management education;Relaxation education;Encouraged to attend Cardiac Rehabilitation for the exercise    Continue Psychosocial Services  Follow up required by staff      Initial Review   Source of Stress Concerns Chronic Illness;Unable to perform yard/household activities;Unable to participate in former interests or hobbies    Comments Will contoinue to monitor and offer support as needed.             Vocational Rehabilitation: Provide vocational rehab assistance to qualifying candidates.   Vocational Rehab Evaluation & Intervention:  Vocational Rehab - 03/10/23 1137        Initial Vocational Rehab Evaluation & Intervention   Assessment shows need for Vocational Rehabilitation No   Renessa is semi retired and does not need vocational rehab at this time            Education: Education Goals: Education classes will be provided on a weekly basis, covering required topics. Participant will state understanding/return demonstration of topics presented.    Education     Row Name 03/14/23 1400     Education   Cardiac Education Topics Pritikin   Geographical information systems officer Exercise   Exercise Workshop Location manager and Fall Prevention   Instruction Review Code 1- [employer]  Class Start Time 1142   Class Stop Time 1224   Class Time Calculation (min) 42 min    Row Name 03/16/23 1400     Education   Cardiac Education Topics Pritikin   Customer service manager   Weekly Topic Adding Flavor - Sodium-Free   Instruction Review Code 1- Verbalizes Understanding   Class Start Time 1145   Class Stop Time 1225   Class Time Calculation (min) 40 min    Row Name 03/21/23 1300     Education   Cardiac Education Topics Pritikin   Nurse, children's Exercise Physiologist   Select Psychosocial   Psychosocial Healthy Minds, Bodies, Hearts   Instruction Review Code 1- Verbalizes Understanding   Class Start Time 1155   Class Stop Time 1240   Class Time Calculation (min) 45 min    Row Name 03/23/23 1200     Education   Cardiac Education Topics Pritikin   Set designer Exercise Physiologist   Weekly Topic Fast and Healthy Breakfasts   Instruction Review Code 1- Verbalizes Understanding   Class Start Time 1143   Class Stop Time 1215   Class Time Calculation (min) 32 min    Row Name 03/28/23 1300     Education   Cardiac Education Topics Pritikin   Select Core Videos     Core  Videos   Educator Nurse   Select Nutrition   Nutrition Becoming a Pritikin Chef   Instruction Review Code 1- Verbalizes Understanding   Class Start Time 1150   Class Stop Time 1226   Class Time Calculation (min) 36 min    Row Name 03/30/23 1300     Education   Cardiac Education Topics Pritikin   Secondary school teacher School   Educator Dietitian;Nurse   Weekly Topic Personalizing Your Pritikin Plate   Instruction Review Code 1- Verbalizes Understanding   Class Start Time 1149   Class Stop Time 1233   Class Time Calculation (min) 44 min    Row Name 04/04/23 1600     Education   Cardiac Education Topics Pritikin   Select Workshops     Workshops   Educator Exercise Physiologist   Select Psychosocial   Psychosocial Workshop Recognizing and Reducing Stress   Instruction Review Code 1- Verbalizes Understanding   Class Start Time 1145   Class Stop Time 1228   Class Time Calculation (min) 43 min            Core Videos: Exercise    Move It!  Clinical staff conducted group or individual video education with verbal and written material and guidebook.  Patient learns the recommended Pritikin exercise program. Exercise with the goal of living a long, healthy life. Some of the health benefits of exercise include controlled diabetes, healthier blood pressure levels, improved cholesterol levels, improved heart and lung capacity, improved sleep, and better body composition. Everyone should speak with their doctor before starting or changing an exercise routine.  Biomechanical Limitations Clinical staff conducted group or individual video education with verbal and written material and guidebook.  Patient learns how biomechanical limitations can impact exercise and how we can mitigate and possibly overcome limitations to have an impactful and balanced exercise routine.  Body Composition Clinical staff conducted group or individual video education with verbal and written  material and  guidebook.  Patient learns that body composition (ratio of muscle mass to fat mass) is a key component to assessing overall fitness, rather than body weight alone. Increased fat mass, especially visceral belly fat, can put [place] at increased risk for metabolic syndrome, type 2 diabetes, heart disease, and even death. It is recommended to combine diet and exercise (cardiovascular and resistance training) to improve your body composition. Seek guidance from your physician and exercise physiologist before implementing an exercise routine.  Exercise Action Plan Clinical staff conducted group or individual video education with verbal and written material and guidebook.  Patient learns the recommended strategies to achieve and enjoy long-term exercise adherence, including variety, self-motivation, self-efficacy, and positive decision making. Benefits of exercise include fitness, good health, weight management, more energy, better sleep, less stress, and overall well-being.  Medical   Heart Disease Risk Reduction Clinical staff conducted group or individual video education with verbal and written material and guidebook.  Patient learns our heart is our most vital organ as it circulates oxygen, nutrients, white blood cells, and hormones throughout the entire body, and carries waste away. Data supports a plant-based eating plan like the Pritikin Program for its effectiveness in slowing progression of and reversing heart disease. The video provides a number of recommendations to address heart disease.   Metabolic Syndrome and Belly Fat  Clinical staff conducted group or individual video education with verbal and written material and guidebook.  Patient learns what metabolic syndrome is, how it leads to heart disease, and how one can reverse it and keep it from coming back. You have metabolic syndrome if you have 3 of the following 5 criteria: abdominal obesity, high blood pressure, high triglycerides,  low HDL cholesterol, and high blood sugar.  Hypertension and Heart Disease Clinical staff conducted group or individual video education with verbal and written material and guidebook.  Patient learns that high blood pressure, or hypertension, is very common in the [place]. Hypertension is largely due to excessive salt intake, but other important risk factors include being overweight, physical inactivity, drinking too much alcohol, smoking, and not eating enough potassium from fruits and vegetables. High blood pressure is a leading risk factor for heart attack, stroke, congestive heart failure, dementia, kidney failure, and premature death. Long-term effects of excessive salt intake include stiffening of the arteries and thickening of heart muscle and organ damage. Recommendations include ways to reduce hypertension and the risk of heart disease.  Diseases of Our Time - Focusing on Diabetes Clinical staff conducted group or individual video education with verbal and written material and guidebook.  Patient learns why the best way to stop diseases of our time is prevention, through food and other lifestyle changes. Medicine (such as prescription pills and surgeries) is often only a Band-Aid on the problem, not a long-term solution. Most common diseases of our time include obesity, type 2 diabetes, hypertension, heart disease, and cancer. The Pritikin Program is recommended and has been proven to help reduce, reverse, and/or prevent the damaging effects of metabolic syndrome.  Nutrition   Overview of the Pritikin Eating Plan  Clinical staff conducted group or individual video education with verbal and written material and guidebook.  Patient learns about the Pritikin Eating Plan for disease risk reduction. The Pritikin Eating Plan emphasizes a wide variety of unrefined, minimally-processed carbohydrates, like fruits, vegetables, whole grains, and legumes. Go, Caution, and Stop food choices are  explained. Plant-based and lean animal proteins are emphasized. Rationale provided for low sodium intake for blood  pressure control, low added sugars for blood sugar stabilization, and low added fats and oils for coronary artery disease risk reduction and weight management.  Calorie Density  Clinical staff conducted group or individual video education with verbal and written material and guidebook.  Patient learns about calorie density and how it impacts the Pritikin Eating Plan. Knowing the characteristics of the food you choose will help you decide whether those foods will lead to weight gain or weight loss, and whether you want to consume more or less of them. Weight loss is usually a side effect of the Pritikin Eating Plan because of its focus on low calorie-dense foods.  Label Reading  Clinical staff conducted group or individual video education with verbal and written material and guidebook.  Patient learns about the Pritikin recommended label reading guidelines and corresponding recommendations regarding calorie density, added sugars, sodium content, and whole grains.  Dining Out - Part 1  Clinical staff conducted group or individual video education with verbal and written material and guidebook.  Patient learns that restaurant meals can be sabotaging because they can be so high in calories, fat, sodium, and/or sugar. Patient learns recommended strategies on how to positively address this and avoid unhealthy pitfalls.  Facts on Fats  Clinical staff conducted group or individual video education with verbal and written material and guidebook.  Patient learns that lifestyle modifications can be just as effective, if not more so, as many medications for lowering your risk of heart disease. A Pritikin lifestyle can help to reduce your risk of inflammation and atherosclerosis (cholesterol build-up, or plaque, in the artery walls). Lifestyle interventions such as dietary choices and physical activity  address the cause of atherosclerosis. A review of the types of fats and their impact on blood cholesterol levels, along with dietary recommendations to reduce fat intake is also included.  Nutrition Action Plan  Clinical staff conducted group or individual video education with verbal and written material and guidebook.  Patient learns how to incorporate Pritikin recommendations into their lifestyle. Recommendations include planning and keeping personal health goals in mind as an important part of their success.  Healthy Mind-Set    Healthy Minds, Bodies, Hearts  Clinical staff conducted group or individual video education with verbal and written material and guidebook.  Patient learns how to identify when they are stressed. Video will discuss the impact of that stress, as well as the many benefits of stress management. Patient will also be introduced to stress management techniques. The way we think, act, and feel has an impact on our hearts.  How Our Thoughts Can Heal Our Hearts  Clinical staff conducted group or individual video education with verbal and written material and guidebook.  Patient learns that negative thoughts can cause depression and anxiety. This can result in negative lifestyle behavior and serious health problems. Cognitive behavioral therapy is an effective method to help control our thoughts in order to change and improve our emotional outlook.  Additional Videos:  Exercise    Improving Performance  Clinical staff conducted group or individual video education with verbal and written material and guidebook.  Patient learns to use a non-linear approach by alternating intensity levels and lengths of time spent exercising to help burn more calories and lose more body fat. Cardiovascular exercise helps improve heart health, metabolism, hormonal balance, blood sugar control, and recovery from fatigue. Resistance training improves strength, endurance, balance, coordination,  reaction time, metabolism, and muscle mass. Flexibility exercise improves circulation, posture, and balance. Seek guidance from  your physician and exercise physiologist before implementing an exercise routine and learn your capabilities and proper form for all exercise.  Introduction to Yoga  Clinical staff conducted group or individual video education with verbal and written material and guidebook.  Patient learns about yoga, a discipline of the coming together of mind, breath, and body. The benefits of yoga include improved flexibility, improved range of motion, better posture and core strength, increased lung function, weight loss, and positive self-image. Yoga's heart health benefits include lowered blood pressure, healthier heart rate, decreased cholesterol and triglyceride levels, improved immune function, and reduced stress. Seek guidance from your physician and exercise physiologist before implementing an exercise routine and learn your capabilities and proper form for all exercise.  Medical   Aging: Enhancing Your Quality of Life  Clinical staff conducted group or individual video education with verbal and written material and guidebook.  Patient learns key strategies and recommendations to stay in good physical health and enhance quality of life, such as prevention strategies, having an advocate, securing a Health Care Proxy and Power of Attorney, and keeping a list of medications and system for tracking them. It also discusses how to avoid risk for bone loss.  Biology of Weight Control  Clinical staff conducted group or individual video education with verbal and written material and guidebook.  Patient learns that weight gain occurs because we consume more calories than we burn (eating more, moving less). Even if your body weight is normal, you may have higher ratios of fat compared to muscle mass. Too much body fat puts you at increased risk for cardiovascular disease, heart attack, stroke,  type 2 diabetes, and obesity-related cancers. In addition to exercise, following the Pritikin Eating Plan can help reduce your risk.  Decoding Lab Results  Clinical staff conducted group or individual video education with verbal and written material and guidebook.  Patient learns that lab test reflects one measurement whose values change over time and are influenced by many factors, including medication, stress, sleep, exercise, food, hydration, pre-existing medical conditions, and more. It is recommended to use the knowledge from this video to become more involved with your lab results and evaluate your numbers to speak with your doctor.   Diseases of Our Time - Overview  Clinical staff conducted group or individual video education with verbal and written material and guidebook.  Patient learns that according to the CDC, 50% to 70% of chronic diseases (such as obesity, type 2 diabetes, elevated lipids, hypertension, and heart disease) are avoidable through lifestyle improvements including healthier food choices, listening to satiety cues, and increased physical activity.  Sleep Disorders Clinical staff conducted group or individual video education with verbal and written material and guidebook.  Patient learns how good quality and duration of sleep are important to overall health and well-being. Patient also learns about sleep disorders and how they impact health along with recommendations to address them, including discussing with a physician.  Nutrition  Dining Out - Part 2 Clinical staff conducted group or individual video education with verbal and written material and guidebook.  Patient learns how to plan ahead and communicate in order to maximize their dining experience in a healthy and nutritious manner. Included are recommended food choices based on the type of restaurant the patient is visiting.   Fueling a Banker conducted group or individual video education with  verbal and written material and guidebook.  There is a strong connection between our food choices and our health. Diseases  like obesity and type 2 diabetes are very prevalent and are in large-part due to lifestyle choices. The Pritikin Eating Plan provides plenty of food and hunger-curbing satisfaction. It is easy to follow, affordable, and helps reduce health risks.  Menu Workshop  Clinical staff conducted group or individual video education with verbal and written material and guidebook.  Patient learns that restaurant meals can sabotage health goals because they are often packed with calories, fat, sodium, and sugar. Recommendations include strategies to plan ahead and to communicate with the manager, chef, or server to help order a healthier meal.  Planning Your Eating Strategy  Clinical staff conducted group or individual video education with verbal and written material and guidebook.  Patient learns about the Pritikin Eating Plan and its benefit of reducing the risk of disease. The Pritikin Eating Plan does not focus on calories. Instead, it emphasizes high-quality, nutrient-rich foods. By knowing the characteristics of the foods, we choose, we can determine their calorie density and make informed decisions.  Targeting Your Nutrition Priorities  Clinical staff conducted group or individual video education with verbal and written material and guidebook.  Patient learns that lifestyle habits have a tremendous impact on disease risk and progression. This video provides eating and physical activity recommendations based on your personal health goals, such as reducing LDL cholesterol, losing weight, preventing or controlling type 2 diabetes, and reducing high blood pressure.  Vitamins and Minerals  Clinical staff conducted group or individual video education with verbal and written material and guidebook.  Patient learns different ways to obtain key vitamins and minerals, including through a  recommended healthy diet. It is important to discuss all supplements you take with your doctor.   Healthy Mind-Set    Smoking Cessation  Clinical staff conducted group or individual video education with verbal and written material and guidebook.  Patient learns that cigarette smoking and tobacco addiction pose a serious health risk which affects millions of people. Stopping smoking will significantly reduce the risk of heart disease, lung disease, and many forms of cancer. Recommended strategies for quitting are covered, including working with your doctor to develop a successful plan.  Culinary   Becoming a Set designer conducted group or individual video education with verbal and written material and guidebook.  Patient learns that cooking at home can be healthy, cost-effective, quick, and puts them in control. Keys to cooking healthy recipes will include looking at your recipe, assessing your equipment needs, planning ahead, making it simple, choosing cost-effective seasonal ingredients, and limiting the use of added fats, salts, and sugars.  Cooking - Breakfast and Snacks  Clinical staff conducted group or individual video education with verbal and written material and guidebook.  Patient learns how important breakfast is to satiety and nutrition through the entire day. Recommendations include key foods to eat during breakfast to help stabilize blood sugar levels and to prevent overeating at meals later in the day. Planning ahead is also a key component.  Cooking - Educational psychologist conducted group or individual video education with verbal and written material and guidebook.  Patient learns eating strategies to improve overall health, including an approach to cook more at home. Recommendations include thinking of animal protein as a side on your plate rather than center stage and focusing instead on lower calorie dense options like vegetables, fruits, whole  grains, and plant-based proteins, such as beans. Making sauces in large quantities to freeze for later and leaving the skin on your  vegetables are also recommended to maximize your experience.  Cooking - Healthy Salads and Dressing Clinical staff conducted group or individual video education with verbal and written material and guidebook.  Patient learns that vegetables, fruits, whole grains, and legumes are the foundations of the Pritikin Eating Plan. Recommendations include how to incorporate each of these in flavorful and healthy salads, and how to create homemade salad dressings. Proper handling of ingredients is also covered. Cooking - Soups and [employer] - Soups and Desserts Clinical staff conducted group or individual video education with verbal and written material and guidebook.  Patient learns that Pritikin soups and desserts make for easy, nutritious, and delicious snacks and meal components that are low in sodium, fat, sugar, and calorie density, while high in vitamins, minerals, and filling fiber. Recommendations include simple and healthy ideas for soups and desserts.   Overview     The Pritikin Solution Program Overview Clinical staff conducted group or individual video education with verbal and written material and guidebook.  Patient learns that the results of the Pritikin Program have been documented in more than 100 articles published in peer-reviewed journals, and the benefits include reducing risk factors for (and, in some cases, even reversing) high cholesterol, high blood pressure, type 2 diabetes, obesity, and more! An overview of the three key pillars of the Pritikin Program will be covered: eating well, doing regular exercise, and having a healthy mind-set.  WORKSHOPS  Exercise: Exercise Basics: Building Your Action Plan Clinical staff led group instruction and group discussion with PowerPoint presentation and patient guidebook. To enhance the learning environment  the use of posters, models and videos may be added. At the conclusion of this workshop, patients will comprehend the difference between physical activity and exercise, as well as the benefits of incorporating both, into their routine. Patients will understand the FITT (Frequency, Intensity, Time, and Type) principle and how to use it to build an exercise action plan. In addition, safety concerns and other considerations for exercise and cardiac rehab will be addressed by the presenter. The purpose of this lesson is to promote a comprehensive and effective weekly exercise routine in order to improve patients' overall level of fitness.   Managing Heart Disease: Your Path to a Healthier Heart Clinical staff led group instruction and group discussion with PowerPoint presentation and patient guidebook. To enhance the learning environment the use of posters, models and videos may be added.At the conclusion of this workshop, patients will understand the anatomy and physiology of the heart. Additionally, they will understand how Pritikin's three pillars impact the risk factors, the progression, and the management of heart disease.  The purpose of this lesson is to provide a high-level overview of the heart, heart disease, and how the Pritikin lifestyle positively impacts risk factors.  Exercise Biomechanics Clinical staff led group instruction and group discussion with PowerPoint presentation and patient guidebook. To enhance the learning environment the use of posters, models and videos may be added. Patients will learn how the structural parts of their bodies function and how these functions impact their daily activities, movement, and exercise. Patients will learn how to promote a neutral spine, learn how to manage pain, and identify ways to improve their physical movement in order to promote healthy living. The purpose of this lesson is to expose patients to common physical limitations that impact  physical activity. Participants will learn practical ways to adapt and manage aches and pains, and to minimize their effect on regular exercise. Patients will learn  how to maintain good posture while sitting, walking, and lifting.  Balance Training and Fall Prevention  Clinical staff led group instruction and group discussion with PowerPoint presentation and patient guidebook. To enhance the learning environment the use of posters, models and videos may be added. At the conclusion of this workshop, patients will understand the importance of their sensorimotor skills (vision, proprioception, and the vestibular system) in maintaining their ability to balance as they age. Patients will apply a variety of balancing exercises that are appropriate for their current level of function. Patients will understand the common causes for poor balance, possible solutions to these problems, and ways to modify their physical environment in order to minimize their fall risk. The purpose of this lesson is to teach patients about the importance of maintaining balance as they age and ways to minimize their risk of falling.  WORKSHOPS   Nutrition:  Fueling a Ship broker led group instruction and group discussion with PowerPoint presentation and patient guidebook. To enhance the learning environment the use of posters, models and videos may be added. Patients will review the foundational principles of the Pritikin Eating Plan and understand what constitutes a serving size in each of the food groups. Patients will also learn Pritikin-friendly foods that are better choices when away from home and review make-ahead meal and snack options. Calorie density will be reviewed and applied to three nutrition priorities: weight maintenance, weight loss, and weight gain. The purpose of this lesson is to reinforce (in a group setting) the key concepts around what patients are recommended to eat and how to apply these  guidelines when away from home by planning and selecting Pritikin-friendly options. Patients will understand how calorie density may be adjusted for different weight management goals.  Mindful Eating  Clinical staff led group instruction and group discussion with PowerPoint presentation and patient guidebook. To enhance the learning environment the use of posters, models and videos may be added. Patients will briefly review the concepts of the Pritikin Eating Plan and the importance of low-calorie dense foods. The concept of mindful eating will be introduced as well as the importance of paying attention to internal hunger signals. Triggers for non-hunger eating and techniques for dealing with triggers will be explored. The purpose of this lesson is to provide patients with the opportunity to review the basic principles of the Pritikin Eating Plan, discuss the value of eating mindfully and how to measure internal cues of hunger and fullness using the Hunger Scale. Patients will also discuss reasons for non-hunger eating and learn strategies to use for controlling emotional eating.  Targeting Your Nutrition Priorities Clinical staff led group instruction and group discussion with PowerPoint presentation and patient guidebook. To enhance the learning environment the use of posters, models and videos may be added. Patients will learn how to determine their genetic susceptibility to disease by reviewing their family history. Patients will gain insight into the importance of diet as part of an overall healthy lifestyle in mitigating the impact of genetics and other environmental insults. The purpose of this lesson is to provide patients with the opportunity to assess their personal nutrition priorities by looking at their family history, their own health history and current risk factors. Patients will also be able to discuss ways of prioritizing and modifying the Pritikin Eating Plan for their highest risk  areas  Menu  Clinical staff led group instruction and group discussion with PowerPoint presentation and patient guidebook. To enhance the learning environment the use of  posters, models and videos may be added. Using menus brought in from [employer], or printed from [employer], patients will apply the Pritikin dining out guidelines that were presented in the [employer] video. Patients will also be able to practice these guidelines in a variety of provided scenarios. The purpose of this lesson is to provide patients with the opportunity to practice hands-on learning of the Pritikin Dining Out guidelines with actual menus and practice scenarios.  Label Reading Clinical staff led group instruction and group discussion with PowerPoint presentation and patient guidebook. To enhance the learning environment the use of posters, models and videos may be added. Patients will review and discuss the Pritikin label reading guidelines presented in Pritikin's Label Reading Educational series video. Using fool labels brought in from local grocery stores and markets, patients will apply the label reading guidelines and determine if the packaged food meet the Pritikin guidelines. The purpose of this lesson is to provide patients with the opportunity to review, discuss, and practice hands-on learning of the Pritikin Label Reading guidelines with actual packaged food labels. Cooking School  Pritikin's [employer] are designed to teach patients ways to prepare quick, simple, and affordable recipes at home. The importance of nutrition's role in chronic disease risk reduction is reflected in its emphasis in the overall Pritikin program. By learning how to prepare essential core Pritikin Eating Plan recipes, patients will increase control over what they eat; be able to customize the flavor of foods without the use of added salt, sugar, or fat; and improve the quality of the food they  consume. By learning a set of core recipes which are easily assembled, quickly prepared, and affordable, patients are more likely to prepare more healthy foods at home. These workshops focus on convenient breakfasts, simple entres, side dishes, and desserts which can be prepared with minimal effort and are consistent with nutrition recommendations for cardiovascular risk reduction. Cooking [employer] are taught by a Armed forces logistics/support/administrative officer (RD) who has been trained by the [employer]. The chef or RD has a clear understanding of the importance of minimizing - if not completely eliminating - added fat, sugar, and sodium in recipes. Throughout the series of Cooking School Workshop sessions, patients will learn about healthy ingredients and efficient methods of cooking to build confidence in their capability to prepare    Cooking School weekly topics:  Adding Flavor- Sodium-Free  Fast and Healthy Breakfasts  Powerhouse Plant-Based Proteins  Satisfying Salads and Dressings  Simple Sides and Sauces  International Cuisine-Spotlight on the [employer] Zones  Delicious Desserts  Savory Soups  [employer] - Meals in a Astronomer Appetizers and Snacks  Comforting Weekend Breakfasts  One-Pot Wonders   Fast Evening Meals  Landscape architect Your Pritikin Plate  WORKSHOPS   Healthy Mindset (Psychosocial):  Focused Goals, Sustainable Changes Clinical staff led group instruction and group discussion with PowerPoint presentation and patient guidebook. To enhance the learning environment the use of posters, models and videos may be added. Patients will be able to apply effective goal setting strategies to establish at least one personal goal, and then take consistent, meaningful action toward that goal. They will learn to identify common barriers to achieving personal goals and develop strategies to overcome them. Patients will also gain an understanding of how our  mind-set can impact our ability to achieve goals and the importance of cultivating a positive and growth-oriented mind-set. The purpose of this lesson is  to provide patients with a deeper understanding of how to set and achieve personal goals, as well as the tools and strategies needed to overcome common obstacles which may arise along the way.  From Head to Heart: The Power of a Healthy Outlook  Clinical staff led group instruction and group discussion with PowerPoint presentation and patient guidebook. To enhance the learning environment the use of posters, models and videos may be added. Patients will be able to recognize and describe the impact of emotions and mood on physical health. They will discover the importance of self-care and explore self-care practices which may work for them. Patients will also learn how to utilize the 4 C's to cultivate a healthier outlook and better manage stress and challenges. The purpose of this lesson is to demonstrate to patients how a healthy outlook is an essential part of maintaining good health, especially as they continue their cardiac rehab journey.  Healthy Sleep for a Healthy Heart Clinical staff led group instruction and group discussion with PowerPoint presentation and patient guidebook. To enhance the learning environment the use of posters, models and videos may be added. At the conclusion of this workshop, patients will be able to demonstrate knowledge of the importance of sleep to overall health, well-being, and quality of life. They will understand the symptoms of, and treatments for, common sleep disorders. Patients will also be able to identify daytime and nighttime behaviors which impact sleep, and they will be able to apply these tools to help manage sleep-related challenges. The purpose of this lesson is to provide patients with a general overview of sleep and outline the importance of quality sleep. Patients will learn about a few of the most common  sleep disorders. Patients will also be introduced to the concept of "sleep hygiene," and discover ways to self-manage certain sleeping problems through simple daily behavior changes. Finally, the workshop will motivate patients by clarifying the links between quality sleep and their goals of heart-healthy living.   Recognizing and Reducing Stress Clinical staff led group instruction and group discussion with PowerPoint presentation and patient guidebook. To enhance the learning environment the use of posters, models and videos may be added. At the conclusion of this workshop, patients will be able to understand the types of stress reactions, differentiate between acute and chronic stress, and recognize the impact that chronic stress has on their health. They will also be able to apply different coping mechanisms, such as reframing negative self-talk. Patients will have the opportunity to practice a variety of stress management techniques, such as deep abdominal breathing, progressive muscle relaxation, and/or guided imagery.  The purpose of this lesson is to educate patients on the role of stress in their lives and to provide healthy techniques for coping with it.  Learning Barriers/Preferences:  Learning Barriers/Preferences - 03/10/23 1226       Learning Barriers/Preferences   Learning Barriers Exercise Concerns;Sight   Pt wears reading glasses, dizzy spells, poor balance, assistive device            Education Topics:  Knowledge Questionnaire Score:  Knowledge Questionnaire Score - 03/10/23 1227       Knowledge Questionnaire Score   Pre Score 21/24             Core Components/Risk Factors/Patient Goals at Admission:  Personal Goals and Risk Factors at Admission - 03/10/23 1228       Core Components/Risk Factors/Patient Goals on Admission    Weight Management Yes;Obesity;Weight Loss    Intervention Weight Management:  Develop a combined nutrition and exercise program designed  to reach desired caloric intake, while maintaining appropriate intake of nutrient and fiber, sodium and fats, and appropriate energy expenditure required for the weight goal.;Weight Management: Provide education and appropriate resources to help participant work on and attain dietary goals.;Weight Management/Obesity: Establish reasonable short term and long term weight goals.;Obesity: Provide education and appropriate resources to help participant work on and attain dietary goals.    Expected Outcomes Long Term: Adherence to nutrition and physical activity/exercise program aimed toward attainment of established weight goal;Short Term: Continue to assess and modify interventions until short term weight is achieved;Weight Maintenance: Understanding of the daily nutrition guidelines, which includes 25-35% calories from fat, 7% or less cal from saturated fats, less than 200mg  cholesterol, less than 1.5gm of sodium, & 5 or more servings of fruits and vegetables daily;Weight Loss: Understanding of general recommendations for a balanced deficit meal plan, which promotes 1-2 lb weight loss per week and includes a negative energy balance of [phone] kcal/d;Understanding recommendations for meals to include 15-35% energy as protein, 25-35% energy from fat, 35-60% energy from carbohydrates, less than 200mg  of dietary cholesterol, 20-35 gm of total fiber daily;Understanding of distribution of calorie intake throughout the day with the consumption of 4-5 meals/snacks    Diabetes Yes    Intervention Provide education about signs/symptoms and action to take for hypo/hyperglycemia.;Provide education about proper nutrition, including hydration, and aerobic/resistive exercise prescription along with prescribed medications to achieve blood glucose in normal ranges: Fasting glucose 65-99 mg/dL    Expected Outcomes Short Term: Participant verbalizes understanding of the signs/symptoms and immediate care of hyper/hypoglycemia, proper  foot care and importance of medication, aerobic/resistive exercise and nutrition plan for blood glucose control.;Long Term: Attainment of HbA1C < 7%.    Heart Failure Yes    Intervention Provide a combined exercise and nutrition program that is supplemented with education, support and counseling about heart failure. Directed toward relieving symptoms such as shortness of breath, decreased exercise tolerance, and extremity edema.    Expected Outcomes Improve functional capacity of life;Short term: Attendance in program 2-3 days a week with increased exercise capacity. Reported lower sodium intake. Reported increased fruit and vegetable intake. Reports medication compliance.;Short term: Daily weights obtained and reported for increase. Utilizing diuretic protocols set by physician.;Long term: Adoption of self-care skills and reduction of barriers for early signs and symptoms recognition and intervention leading to self-care maintenance.    Hypertension Yes    Intervention Provide education on lifestyle modifcations including regular physical activity/exercise, weight management, moderate sodium restriction and increased consumption of fresh fruit, vegetables, and low fat dairy, alcohol moderation, and smoking cessation.;Monitor prescription use compliance.    Expected Outcomes Short Term: Continued assessment and intervention until BP is < 140/34mm HG in hypertensive participants. < 130/51mm HG in hypertensive participants with diabetes, heart failure or chronic kidney disease.;Long Term: Maintenance of blood pressure at goal levels.    Lipids Yes    Intervention Provide education and support for participant on nutrition & aerobic/resistive exercise along with prescribed medications to achieve LDL 70mg , HDL >40mg .    Expected Outcomes Short Term: Participant states understanding of desired cholesterol values and is compliant with medications prescribed. Participant is following exercise prescription and  nutrition guidelines.;Long Term: Cholesterol controlled with medications as prescribed, with individualized exercise RX and with personalized nutrition plan. Value goals: LDL < 70mg , HDL > 40 mg.    Personal Goal Other Yes    Personal Goal Gain more independance and increase  strength    Intervention Will continue to monitor pt and progress workloads as tolerated without sign or symptom    Expected Outcomes Pt will achieve her goals             Core Components/Risk Factors/Patient Goals Review:   Goals and Risk Factor Review     Row Name 03/14/23 1416 03/31/23 1528           Core Components/Risk Factors/Patient Goals Review   Personal Goals Review Weight Management/Obesity;Improve shortness of breath with ADL's;Lipids;Heart Failure;Diabetes;Hypertension;Stress Weight Management/Obesity;Improve shortness of breath with ADL's;Lipids;Heart Failure;Diabetes;Hypertension;Stress      Review Layanna started cardiac rehab on 03/14/23. Kerria did fair with exercise. nonsusutained sinus brady. Oxygen saturations and CBG's stable. Zola Button a rollator for stabilty. Mikeya exercised on oxygen and was encouraged to take frequent rest breaks. Deaun is deconditioned. Imojean continues to do well with exercise at cardiac rehab for her fitness level.  Oxygen saturations, vital signs and CBG's have been stable. Ellieana remains decondtioned. Kassia is enjoying participating in cardiac rehab and says that the program has been helpful for her so far.      Expected Outcomes Rakesha will continue to partipate in cardiac rehab for exercise, nutrition and lifestyle modifications. Karie will continue to partipate in cardiac rehab for exercise, nutrition and lifestyle modifications.               Core Components/Risk Factors/Patient Goals at Discharge (Final Review):   Goals and Risk Factor Review - 03/31/23 1528       Core Components/Risk Factors/Patient Goals Review   Personal Goals Review Weight  Management/Obesity;Improve shortness of breath with ADL's;Lipids;Heart Failure;Diabetes;Hypertension;Stress    Review Sherryll continues to do well with exercise at cardiac rehab for her fitness level.  Oxygen saturations, vital signs and CBG's have been stable. Jolinda remains decondtioned. Adin is enjoying participating in cardiac rehab and says that the program has been helpful for her so far.    Expected Outcomes Zienna will continue to partipate in cardiac rehab for exercise, nutrition and lifestyle modifications.             ITP Comments:  ITP Comments     Row Name 03/10/23 1610 03/14/23 1411 03/31/23 1520       ITP Comments Dr. Armanda Magic medical director. Introduction to pritikin education/intensive cardiac rehab. Initial orientation packet reviewed with patient. 30 day ITP Review. Karie started cardiac rehab on 03/14/23. Yekaterina did fair with exercise for her fitness level. [place] is decondtioned. 30 day ITP Review. Preeya has good attendance and participation in  cardiac rehab for her fitness level.              Comments: See ITP comments.

## 2023-04-06 ENCOUNTER — Encounter (HOSPITAL_COMMUNITY)
Admission: RE | Admit: 2023-04-06 | Discharge: 2023-04-06 | Disposition: A | Discharge status: Home / Self Care | Payer: Medicare Other | Source: Ambulatory Visit | Attending: Cardiology | Admitting: Cardiology

## 2023-04-06 DIAGNOSIS — Z48812 Encounter for surgical aftercare following surgery on the circulatory system: Secondary | ICD-10-CM | POA: Diagnosis not present

## 2023-04-06 DIAGNOSIS — Z9861 Coronary angioplasty status: Secondary | ICD-10-CM

## 2023-04-11 ENCOUNTER — Telehealth (HOSPITAL_COMMUNITY): Payer: Self-pay

## 2023-04-11 ENCOUNTER — Encounter (HOSPITAL_COMMUNITY)
Admission: RE | Admit: 2023-04-11 | Discharge: 2023-04-11 | Disposition: A | Discharge status: Home / Self Care | Payer: Medicare Other | Source: Ambulatory Visit | Attending: Cardiology

## 2023-04-11 DIAGNOSIS — Z9861 Coronary angioplasty status: Secondary | ICD-10-CM

## 2023-04-11 DIAGNOSIS — Z48812 Encounter for surgical aftercare following surgery on the circulatory system: Secondary | ICD-10-CM | POA: Diagnosis not present

## 2023-04-11 NOTE — Telephone Encounter (Signed)
 Called and left patient a voice message to confirm/remind patient of their appointment [facility] on 04[facility].   And reminded to in bring all medications and/or complete list.

## 2023-04-12 ENCOUNTER — Encounter (HOSPITAL_COMMUNITY): Payer: Self-pay

## 2023-04-12 ENCOUNTER — Ambulatory Visit (HOSPITAL_COMMUNITY)
Admission: RE | Admit: 2023-04-12 | Discharge: 2023-04-12 | Disposition: A | Discharge status: Home / Self Care | Payer: Medicare Other | Source: Ambulatory Visit | Attending: Family Medicine | Admitting: Family Medicine

## 2023-04-12 VITALS — BP 118/62 | HR 53 | Wt 227.4 lb

## 2023-04-12 DIAGNOSIS — Z8673 Personal history of transient ischemic attack (TIA), and cerebral infarction without residual deficits: Secondary | ICD-10-CM | POA: Diagnosis not present

## 2023-04-12 DIAGNOSIS — G4733 Obstructive sleep apnea (adult) (pediatric): Secondary | ICD-10-CM

## 2023-04-12 DIAGNOSIS — I25119 Atherosclerotic heart disease of native coronary artery with unspecified angina pectoris: Secondary | ICD-10-CM | POA: Diagnosis present

## 2023-04-12 DIAGNOSIS — I480 Paroxysmal atrial fibrillation: Secondary | ICD-10-CM

## 2023-04-12 DIAGNOSIS — I080 Rheumatic disorders of both mitral and aortic valves: Secondary | ICD-10-CM | POA: Insufficient documentation

## 2023-04-12 DIAGNOSIS — F32A Depression, unspecified: Secondary | ICD-10-CM

## 2023-04-12 DIAGNOSIS — Z9981 Dependence on supplemental oxygen: Secondary | ICD-10-CM | POA: Insufficient documentation

## 2023-04-12 DIAGNOSIS — Z7902 Long term (current) use of antithrombotics/antiplatelets: Secondary | ICD-10-CM | POA: Insufficient documentation

## 2023-04-12 DIAGNOSIS — I5022 Chronic systolic (congestive) heart failure: Secondary | ICD-10-CM | POA: Diagnosis not present

## 2023-04-12 DIAGNOSIS — I2721 Secondary pulmonary arterial hypertension: Secondary | ICD-10-CM | POA: Insufficient documentation

## 2023-04-12 DIAGNOSIS — Z6841 Body Mass Index (BMI) 40.0 and over, adult: Secondary | ICD-10-CM | POA: Diagnosis not present

## 2023-04-12 DIAGNOSIS — I447 Left bundle-branch block, unspecified: Secondary | ICD-10-CM | POA: Diagnosis not present

## 2023-04-12 DIAGNOSIS — Z7901 Long term (current) use of anticoagulants: Secondary | ICD-10-CM | POA: Insufficient documentation

## 2023-04-12 DIAGNOSIS — E785 Hyperlipidemia, unspecified: Secondary | ICD-10-CM | POA: Diagnosis not present

## 2023-04-12 DIAGNOSIS — Z794 Long term (current) use of insulin: Secondary | ICD-10-CM | POA: Insufficient documentation

## 2023-04-12 DIAGNOSIS — E1169 Type 2 diabetes mellitus with other specified complication: Secondary | ICD-10-CM | POA: Diagnosis not present

## 2023-04-12 DIAGNOSIS — R519 Headache, unspecified: Secondary | ICD-10-CM | POA: Insufficient documentation

## 2023-04-12 DIAGNOSIS — I252 Old myocardial infarction: Secondary | ICD-10-CM | POA: Insufficient documentation

## 2023-04-12 DIAGNOSIS — I340 Nonrheumatic mitral (valve) insufficiency: Secondary | ICD-10-CM

## 2023-04-12 DIAGNOSIS — Z9889 Other specified postprocedural states: Secondary | ICD-10-CM | POA: Insufficient documentation

## 2023-04-12 DIAGNOSIS — I10 Essential (primary) hypertension: Secondary | ICD-10-CM | POA: Diagnosis not present

## 2023-04-12 DIAGNOSIS — R001 Bradycardia, unspecified: Secondary | ICD-10-CM

## 2023-04-12 DIAGNOSIS — I493 Ventricular premature depolarization: Secondary | ICD-10-CM | POA: Diagnosis not present

## 2023-04-12 DIAGNOSIS — Z955 Presence of coronary angioplasty implant and graft: Secondary | ICD-10-CM | POA: Diagnosis not present

## 2023-04-12 DIAGNOSIS — Z79899 Other long term (current) drug therapy: Secondary | ICD-10-CM | POA: Insufficient documentation

## 2023-04-12 DIAGNOSIS — I5042 Chronic combined systolic (congestive) and diastolic (congestive) heart failure: Secondary | ICD-10-CM | POA: Diagnosis not present

## 2023-04-12 DIAGNOSIS — I130 Hypertensive heart and chronic kidney disease with heart failure and stage 1 through stage 4 chronic kidney disease, or unspecified chronic kidney disease: Secondary | ICD-10-CM | POA: Diagnosis not present

## 2023-04-12 DIAGNOSIS — I25810 Atherosclerosis of coronary artery bypass graft(s) without angina pectoris: Secondary | ICD-10-CM | POA: Insufficient documentation

## 2023-04-12 DIAGNOSIS — Z7985 Long-term (current) use of injectable non-insulin antidiabetic drugs: Secondary | ICD-10-CM | POA: Diagnosis not present

## 2023-04-12 DIAGNOSIS — F419 Anxiety disorder, unspecified: Secondary | ICD-10-CM | POA: Diagnosis not present

## 2023-04-12 LAB — BASIC METABOLIC PANEL
Anion gap: 10 (ref 5–15)
BUN: 18 mg/dL (ref 8–23)
CO2: 26 mmol/L (ref 22–32)
Calcium: 9 mg/dL (ref 8.9–10.3)
Chloride: 108 mmol/L (ref 98–111)
Creatinine, Ser: 1.56 mg/dL — ABNORMAL HIGH (ref 0.44–1.00)
GFR, Estimated: 34 mL/min — ABNORMAL LOW (ref >60)
Glucose, Bld: 157 mg/dL — ABNORMAL HIGH (ref 70–99)
Potassium: 4.6 mmol/L (ref 3.5–5.1)
Sodium: 144 mmol/L (ref 135–145)

## 2023-04-12 LAB — CBC
HCT: 40.7 % (ref 36.0–46.0)
Hemoglobin: 13.6 g/dL (ref 12.0–15.0)
MCH: 31.5 pg (ref 26.0–34.0)
MCHC: 33.4 g/dL (ref 30.0–36.0)
MCV: 94.2 fL (ref 80.0–100.0)
Platelets: 148 10*3/uL — ABNORMAL LOW (ref 150–400)
RBC: 4.32 MIL/uL (ref 3.87–5.11)
RDW: 13.5 % (ref 11.5–15.5)
WBC: 8.9 10*3/uL (ref 4.0–10.5)
nRBC: 0 % (ref 0.0–0.2)

## 2023-04-12 LAB — FERRITIN: Ferritin: 44 ng/mL (ref 11–307)

## 2023-04-12 LAB — IRON AND TIBC
Iron: 101 ug/dL (ref 28–170)
Saturation Ratios: 30 % (ref 10.4–31.8)
TIBC: 336 ug/dL (ref 250–450)
UIBC: 235 ug/dL

## 2023-04-12 LAB — BRAIN NATRIURETIC PEPTIDE: B Natriuretic Peptide: 88.6 pg/mL (ref 0.0–100.0)

## 2023-04-12 MED ORDER — OZEMPIC (2 MG/DOSE) 8 MG/3ML ~~LOC~~ SOPN: 2.0000 mg | PEN_INJECTOR | SUBCUTANEOUS | 11 refills | Status: DC

## 2023-04-12 NOTE — Progress Notes (Signed)
 Date:  04/12/2023   ID:  [name] [name], DOB [birth year], [name] [id number]   Provider location: [facility] Type of Visit: Established patient   PCP:  [name] Putt, MD  HF Cardiologist:  Dr. Shirlee [name]  Chief Complaint: Follow up for CAD and CHF.   History of Present Illness: [name] [name] is a 78 y.o. female who has a history of CAD s/p CABG, diastolic CHF, and cerebrovascular disease with history of CVA presents for followup of CHF and diastolic CHF. She had CABG x 5 in 12/11.  Prior to the CABG she had multiple PCIs.  She had a CVA in 2/14 that presented as visual loss.  She had a left carotid stent in 2/15.    Left heart cath done in Sept. 2014. This showed patent SVG-D, LIMA-LAD, and sequential SVG-OM branches but SVG-RCA and the native RCA were both totally occluded. It was felt that her increased symptoms coincided with occlusion of SVG-RCA. She was started on Imdur to see if this would help with the chest pain and dyspnea. However, she feels like Imdur causes leg cramps and does not think that she can take it. So ranolazine 500 mg bid started and titrated this up to 1000 mg bid.  This helped some but not markedly. Therefore, sent to Dr. Eldridge Dace to address opening RCA CTO. He was able to do this in 5/15 with 4 overlapping Xience DES.  This led to resolution of exertional chest pain.     Mrs Jedlicka was started on Brilinta after PCI.  She became much more short of breath after starting on Brilinta. Brilinta stopped and replaced with Plavix.  Dyspnea improved off Brilinta. Given increasing exertional dyspnea and chest pain,  RHC/LHC was done in 4/17.  This showed stable CAD with no interventional target.  Left and right heart filling pressures were not significantly increased.  Medical management.    She had an MRI/MRA in May 2018 that showed moderately severe stenosis of the supraclinoid segment of the right internal carotid artery, progressed since the 2016 MR angiogram,  and severe basilar artery stenosis.    She developed recurrent exertional chest pain and had LHC in 6/18, showing 4/5 grafts patent with patent native RCA (similar to prior cath, no changes).     Echo 1/19 with EF 55-60%, moderate diastolic dysfunction, PASP 50 mmHg, mildly dilated RV, mild AS, mild-moderate MR.    She reported increased dyspnea and chest pain and had RHC/LHC in 9/19.  She had DES to sequential SVG-OM1/PLOM. While hospitalized, she was noted to have runs of atrial fibrillation.  She was very symptomatic with the atrial fibrillation. Eliquis and amiodarone were started.    She was admitted 12/16-12/19/19 for cardiomems implant and RHC. She was diuresed with IV lasix and transitioned to torsemide 100 mg BID + metolazone once/week. DC weight: 295 lbs.   She had a prolonged hospitalization in 2/20 with acute respiratory failure and fever to 103.  She was thought to have PNA and treated with cefepime.  She was intubated.  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  Amiodarone was stopped and she was put on prednisone. Echo in 2/20 showed EF down to 25-30% with mid-apical LV severe hypokinesis. She had AKI. Possible ITP triggered by infection, HIT negative.  ITP was treated with Solumedrol. She was discharged to SNF.    ORIF left ankle 4/20.   11/20 admission initially with concern for TIAs (staring spells), ended up  thinking possible partial seizures.  Head MRI did not show CVA.  Echo in 11/20 showed EF 45-50%, moderately decreased RV systolic function, moderate RV enlargement, moderate-severe MR, moderate TR.   Patient was admitted again in 12/20 with AKI and hypotension in the setting of sinus bradycardia (HR 30s).  She was volume overloaded on exam.  Toprol XL was stopped and HR increased to 60s.  She was diuresed with IV Lasix.  TEE in 12/20 showed EF 50-55%, septal-lateral dyssynchrony, mildly decreased RV function, moderate MR.   Atypical chest pain in  1/21, Cardiolite showed EF 57%, fixed inferior defect (likely artifact), no ischemia.   Echo in 5/22 showed EF 60-65%, moderate LVH, normal RV, PASP 37 mmHg, 2.5 x 2.1 cm calcified mass posterior mitral annulus with mild-moderate MR => ?severe MAC but more circumscribed and prominent than in the past, mild AS mean gradient 12 mmHg and AVA 1.83 cm^2.  Echo in 11/22 showed EF 55%, normal RV, mass posterior mitral annulus, moderate MR, mild AS, IVC normal.  Cardiac MRI was done in 1/23 to investigate posterior mitral annulus mass; study showed LV EF 48%, mild LV dilation, normal RV with EF 53%, extensive MAC is likely the source of the posterior mitral annulus mass, basal-mid inferior >50% subendocardial LGE suggestive of prior inferior MI.   Follow up 2/23 she had chest tightness x 4 weeks. Arranged for [facility] which showed in-stent restenosis of pRCA, felt to be the source of her symptoms given occluded SVG-RCA. Underwent PTCA to pRCA. RHC with normal PCWP and RA pressure, mild pulmonary hypertension, preserved CO. On ASA + Plavix + Eliquis x 1 month (until 05/21/21), then Plavix + Eliquis.   Echo 8/24 showed EF significantly worse, down to 30-35%, G1DD, normal RV.   With worsening dyspnea and exertional chest pain, LHC/RHC in 9/24 showed mildly elevated PCWP with normal RA pressure, mixed pulmonary venous/pulmonary arterial hypertension, and 99% in-stent restenosis in the proximal RCA, at least 2 layers of stent are present. She then had cutting balloon angioplasty to ISR ostial to distal RCA.   Echo 1/25 showed LV mildly dilated with mild LVH, EF 45-50% on my read with septal-lateral dyssynchrony consistent with IVCD, mild RV dysfunction, mild mitral stenosis mean gradient 4 mmHg, no aortic stenosis.   Zio patch 2/25 showed mostly NSR, rare PVCs, and 3 short SVT runs.  She returns for followup of CHF and CAD.  She is using oxygen at night and prn during the day. She has occasional episodes of  lightheadedness with standing, not severe. No syncope.  She is bradycardic today with junctional beats noted.  She has been having less chest pain since PTCA in 9/24, now CP is generally atypical.  She has finished home PT and is interested in cardiac rehab. She takes torsemide rarely. She is short of breath walking 40-50 feet, uses walker. She has chronic orthopnea.  She has not been using her Cardiomems.   Today she returns for HF follow up. Overall feeling fine. Struggling with depression and memory issues. She has SOB with minimal activity. She walks with a rolling walker. She is participating in cardiac rehab. Having dizzy spells, and has had several falls in past month. She is more dizzy when diastolic BP in 40-50's. Continues with atypical chest pain, occurs randomly, pain worse when she presses on chest. Denies palpitations, abnormal bleeding, edema, or PND. She chronically sleeps reclined. Appetite ok. No fever or chills. Weight at home 224 pounds. Taking all medications. She has been  using her Cardiomems. Wears oxygen every night and PRN during the day.  Cardiomems (personally reviewed from 04/11/23): goal 10, PAD 9  ECG (personally reviewed): none ordered today.  Labs (12/23): K 4.4, creatinine 0.90, LDL 111 Labs (3/24): BNP 163, K 4.7, creatinine 0.9 Labs (7/24): K 4.9, creatinine 0.95, LDL 83 Labs (9/24): K 4.2, SCr 1.16, LDL 25 Labs (10/24): K 4.3, SCr 1.0  Labs (12/24): K 4.1, creatinine 0.88 Labs (2/25): K 4.9, creatinine 1.57   PMH: 1. Diabetic gastroparesis 2. Type II diabetes 3. HTN 4. Morbid obesity 5. CAD: s/p CABG in 12/11 after prior PCIs.  LIMA-LAD, SVG-D, seq SVG-OM1 and OM2, SVG-PDA. Adenosine Cardiolite (8/14) with EF 53% and a small reversible apical defect with a medium, partially reversible inferior defect.  LHC (9/14) with patent SVG-D, LIMA-LAD (50% distal LAD), and sequential SVG-OM branches; the SVG-RCA and the native RCA were occluded.  This was managed  medically initially, but with ongoing exertional chest pain, it was decided to open CTO.  Patient had CTO opening with 4 overlapping Xience DES in the RCA in 5/15.   - LHC (4/17): SVG-D patent, LIMA-LAD patent, distal LAD with several 40-50% stenoses, sequential SVG-OM1 and PLOM patent with 50% proximal stenosis (not flow limiting), SVG-RCA TO with patent RCA stents.  - LHC (6/18): 4/5 grafts patent (SVG-RCA TO, same as past), RCA stents patent => no change.  - LHC (9/19): Sequential SVG-PLOM/OM1 with 80% proximal stenosis, s/p DES.  - Cardiolite (1/21): EF 57%, fixed inferior defect (likely artifact), no ischemia.  - LHC (3/23): severe CAD with 90% in-stent restenosis of pRCA, s/p PTCA to in-stent restenosis of pRCA - LHC (9/24): 99% in-stent restenosis in the proximal RCA, at least 2 layers of stent are present => patient had cutting balloon angioplasty to RCA.  6. Atypical migraines 7. OHS/OSA: Intolerant of CPAP. Uses oxygen with exertion and at night.  8. GERD with hiatal hernia 9. OA 10. HFpEF: She has a Cardiomems device.  Echo (11/13) with EF 50-55%, grade II diastolic dysfunction, mild-moderate MR.  Echo (8/14) with EF 55-60%, grade II diastolic dysfunction, mildly increased aortic valve gradient (mean 12 mmHg) but valve opens well, mild MR and mild RV dilation.  Echo (9/15) with EF 60-65%, grade II diastolic dysfunction, mild aortic stenosis, mild mitral stenosis, mild to moderate mitral regurgitation, mildly dilated RV with normal systolic function, PA systolic pressure 46 mmHg.  - RHC (4/17): mean RA 9, PA 38/15 mean 27, mean PCWP 9, CI 2.5.  - Echo (5/17): EF 55-60%, mild LVH, mildly dilated RV with low normal systolic function, moderate TR, PASP 61 mmHg - Echo (1/19): EF 55-60%, moderate diastolic dysfunction, PASP 50 mmHg, mildly dilated RV, mild AS, mild-moderate MR. - RHC (9/19): mean RA 11, PA 34/12, mean PCWP 15, CI 2.85 - Echo (3/20): EF 25-30% with mid-apical severe hypokinesis.   - Echo (11/20): EF 45-50%, moderately dilated RV with moderately decreased systolic function, moderate-severe MR, moderate TR.  - TEE (12/20): EF 50-55%, septal-lateral dyssynchrony, moderate RV dilation/mildly decreased function, peak RV-RA gradient 51 mmHg, moderate MR.  - Echo (5/22): EF 60-65%, moderate LVH, normal RV, PASP 37 mmHg, 2.5 x 2.1 cm calcified mass posterior mitral annulus with mild-moderate MR => ?severe MAC but more circumscribed and prominent than in the past, mild AS mean gradient 12 mmHg and AVA 1.83 cm^2.  - Echo (11/22): EF 55%, normal RV, mass posterior mitral annulus, moderate MR, mild AS, IVC normal. - Cardiac MRI in 1/23 showed  LV EF 48%, mild LV dilation, normal RV with EF 53%, extensive MAC is likely the source of the posterior mitral annulus mass, basal-mid inferior >50% subendocardial LGE suggestive of prior inferior MI.  - RHC (3/23): normal PCWP (14) and RA pressure (2) , mild pulmonary hypertension, preserved CO/CI (6.51/3.28). - Echo (8/24): EF 30-35%, G1DD, normal RV.  - RHC (9/24): mean RA 5, PA 59/15 mean 36, mean PCWP 22, CI 2.34, PVR 3 WU.   - Echo (1/25): LV mildly dilated with mild LVH, EF 45-50% on my read with septal-lateral dyssynchrony consistent with IVCD, mild RV dysfunction, mild mitral stenosis mean gradient 4 mmHg, no aortic stenosis.  11. CKD 12. Chronic LBBB 13. Anxiety 14. Carotid stenosis: Followed by [facility], >80% LICA stenosis 12/14.  She had left carotid stent in 2/15. Carotid dopplers 8/15 with no significant disease. Carotid dopplers (4/16): < 40% RICA stenosis.  - Carotid dopplers (9/22): 1-39% BICA stenosis.  - Carotid dopplers (11/24): mild BICA stenosis.  15. Cerebrovascular disease: CVA 2/14 with right posterior cerebral artery territory ischemic infarction. Cerebral angiogram in 6/14 showed 70% right vertebral ostial stenosis, 60-65% LICA stenosis, > 70% proximal left posterior cerebral artery stenosis, posterior communicating artery  aneurysm.  Patient has had episodes of transient expressive aphasia.  Carotid dopplers (12/14) showed >80% LICA stenosis. She had a left carotid stent 2/15.  Possible CVA in 7/15. - MRI/MRA in May 2018 that showed moderately severe stenosis of the supraclinoid segment of the right internal carotid artery, progressed since the 2016 MR angiogram and severe basilar artery stenosis.  16. Positional vertigo (suspected) 17. Palpitations: Holter (6/15) with rare PVCs/PACs.  - Event monitor (2/18): NSR, occasional PVCs - 14 day Zio (9/23) mostly NSR, rare PVCs and PACs, few short runs of SVT, no atrial fibrillation or worrisome arrhythmias. 18. Dyspnea with Brilinta. 19. Melanoma: On face, s/p excision.   20. Aortic stenosis: Mild on 11/22 echo.  21. Posterior mitral annular mass: Cardiac MRI was done in 1/23 to investigate posterior mitral annulus mass; study showed LV EF 48%, mild LV dilation, normal RV with EF 53%, extensive MAC is likely the source of the posterior mitral annulus mass, basal-mid inferior >50% subendocardial LGE suggestive of prior inferior MI.  22. Lower extremity arterial dopplers (9/16) were normal.  23. Sleep study (4/18): No significant OSA.  24. Atrial fibrillation: Paroxysmal - Amiodarone stopped, ?lung toxicity.  25. H/o ITP in 3/20.  26. Partial seizures 27. Mitral regurgitation: Moderate on TEE in 12/20.  - Mild to moderate on 5/22 echo.  - Moderate on 11/22 echo  Current Outpatient Medications  Medication Sig Dispense Refill   acetaminophen (TYLENOL) 500 MG tablet Take 500 mg by mouth every 6 (six) hours as needed for mild pain, moderate pain, fever or headache.     albuterol (VENTOLIN HFA) 108 (90 Base) MCG/ACT inhaler Inhale 2 puffs into the lungs every 4 (four) hours as needed for wheezing or shortness of breath.      ALPRAZolam (XANAX) 0.5 MG tablet Take 0.5-1 mg by mouth See admin instructions. Take 0.5 mg  morning at lunch and 1 mg at bedtime     amLODipine  (NORVASC) 2.5 MG tablet Take 1 tablet (2.5 mg total) by mouth at bedtime. 30 tablet 11   apixaban (ELIQUIS) 5 MG TABS tablet Take 1 tablet (5 mg total) by mouth 2 (two) times daily. 180 tablet 3   B Complex-C (B-COMPLEX WITH VITAMIN C) tablet Take 1 tablet by mouth in the morning.  baclofen (LIORESAL) 10 MG tablet Take 10 mg by mouth at bedtime.     bisacodyl (DULCOLAX) 5 MG EC tablet Take 5 mg by mouth daily as needed for moderate constipation.     buPROPion (WELLBUTRIN XL) 300 MG 24 hr tablet Take 300 mg by mouth daily.     carboxymethylcellulose (REFRESH PLUS) 0.5 % SOLN Place 1 drop into both eyes in the morning, at noon, and at bedtime.     Cholecalciferol (VITAMIN D3) 50 MCG (2000 UT) capsule Take 2,000 Units by mouth daily in the afternoon.     clopidogrel (PLAVIX) 75 MG tablet Take 75 mg by mouth every morning.     DULoxetine (CYMBALTA) 30 MG capsule Take 1 capsule (30 mg total) by mouth daily. 30 capsule 0   ezetimibe (ZETIA) 10 MG tablet Take 1 tablet (10 mg total) by mouth at bedtime. 90 tablet 3   fluocinonide cream (LIDEX) 0.05 % Apply 1 application. topically 2 (two) times daily as needed (irritation).     Fluticasone-Salmeterol (ADVAIR) 250-50 MCG/DOSE AEPB Inhale 1 puff into the lungs daily.     gabapentin (NEURONTIN) 600 MG tablet Take 600-1,200 mg by mouth See admin instructions. Take 1 tablet (600 mg) by mouth in the morning & take 2 tablets (1200 mg) by mouth at bedtime     inclisiran (LEQVIO) 284 MG/1.5ML SOSY injection Inject 284 mg into the skin every 6 (six) months.     Insulin Glargine (LANTUS SOLOSTAR) 100 UNIT/ML Solostar Pen Inject 0-8 Units into the skin at bedtime. Per sliding scale     insulin lispro (HUMALOG) 100 UNIT/ML KwikPen Inject 4-6 Units into the skin See admin instructions. Sliding scale Inject 8 units subcutaneously prior to breakfast and supper; add 4 units for CBG >200     loperamide (IMODIUM A-D) 2 MG tablet Take 2 mg by mouth 4 (four) times daily as  needed for diarrhea or loose stools.     Multiple Vitamin (MULTIVITAMIN WITH MINERALS) TABS tablet Take 1 tablet by mouth every morning. Centrum - Women over 50     Multiple Vitamins-Minerals (OCUVITE EYE HEALTH FORMULA) CAPS Take 1 capsule by mouth every morning.     nitroGLYCERIN (NITROSTAT) 0.3 MG SL tablet DISSOLVE 1 TABLET UNDER TONGUE EVERY 5 MINUTES FOR UP TO 3 DOSES AS NEEDED FOR CHEST PAIN 100 tablet 0   ondansetron (ZOFRAN-ODT) 4 MG disintegrating tablet Take 4 mg by mouth 2 (two) times daily.     OXYGEN Inhale 2 L into the lungs at bedtime. Additional during the day     pantoprazole (PROTONIX) 40 MG tablet Take 1 tablet (40 mg total) by mouth daily. 30 tablet 0   pyridOXINE (VITAMIN B6) 100 MG tablet Take 100 mg by mouth every morning.     ranolazine (RANEXA) 1000 MG SR tablet Take 1 tablet (1,000 mg total) by mouth 2 (two) times daily. 180 tablet 3   rosuvastatin (CRESTOR) 40 MG tablet Take 1 tablet (40 mg total) by mouth daily. 90 tablet 3   sacubitril-valsartan (ENTRESTO) 97-103 MG Take 1 tablet by mouth 2 (two) times daily. 180 tablet 3   Semaglutide, 2 MG/DOSE, (OZEMPIC, 2 MG/DOSE,) 8 MG/3ML SOPN Inject 2 mg into the skin once a week. (Patient taking differently: Inject 2 mg into the skin once a week. On saturdays) 3 mL 11   spironolactone (ALDACTONE) 25 MG tablet Take 1 tablet (25 mg total) by mouth daily. 90 tablet 3   torsemide (DEMADEX) 20 MG tablet Take 1 tablet  by mouth every 3rd day as directed. 15 tablet 5   traZODone (DESYREL) 50 MG tablet Take 100-150 mg by mouth at bedtime.     vitamin B-12 (CYANOCOBALAMIN) 1000 MCG tablet Take 1,000 mcg by mouth every morning.      zolpidem (AMBIEN) 10 MG tablet Take 10 mg by mouth at bedtime.     potassium chloride (KLOR-CON M) 10 MEQ tablet Patient takes 1 tablet by mouth every 3rd day as directed (Patient not taking: Reported on 04/12/2023) 15 tablet 5   No current facility-administered medications for this encounter.   Allergies:    Amoxicillin, Brilinta [ticagrelor], Erythromycin, Flagyl [metronidazole], Penicillins, Isosorbide mononitrate [isosorbide nitrate], Jardiance [empagliflozin], Metformin and related, Tape, and Cefotaxime   Social History:  The patient  reports that she has never smoked. She has never used smokeless tobacco. She reports that she does not drink alcohol and does not use drugs.   Family History:  The patient's family history includes AAA (abdominal aortic aneurysm) in her father and mother; Cancer in her sister; Deep vein thrombosis in her father and mother; Dementia in her maternal grandmother and mother; Diabetes in her father, paternal aunt, paternal grandmother, paternal uncle, and paternal uncle; Heart attack in her father; Heart attack (age of onset: 51) in her paternal grandfather; Heart disease in her father and paternal uncle; Hyperlipidemia in her father; Hypertension in her father, mother, and sister; Stroke in her maternal grandmother, paternal grandmother, and paternal uncle.   ROS:  Please see the history of present illness.   All other systems are personally reviewed and negative.   Recent Labs: 10/22/2022: ALT 20; TSH 0.750 10/25/2022: Magnesium 2.2 11/05/2022: Hemoglobin 12.4; Platelets 209 12/01/2022: B Natriuretic Peptide 280.6 03/28/2023: BUN 26; Creatinine, Ser 1.57; Potassium 4.9; Sodium 141  Personally reviewed   Wt Readings from Last 3 Encounters:  04/12/23 103.1 kg (227 lb 6.4 oz)  03/10/23 102.5 kg (225 lb 15.5 oz)  02/28/23 103.5 kg (228 lb 3.2 oz)    BP 118/62   Pulse (!) 53   Wt 103.1 kg (227 lb 6.4 oz)   SpO2 98%   BMI 42.97 kg/m   PHYSICAL EXAM: General:  NAD. No resp difficulty, walked into clinic with rolling walker HEENT: Normal Neck: Supple. No JVD. Cor: Regular rate & rhythm. No rubs, gallops, 2/6 SEM RUSB Lungs: Clear Abdomen: Soft, obese, nontender, nondistended.  Extremities: No cyanosis, clubbing, rash, edema Neuro: Alert & oriented x 3, moves all 4  extremities w/o difficulty. Affect pleasant.  ASSESSMENT AND PLAN: 1. CAD: Occluded native RCA and SVG-RCA.  Status post opening of CTO RCA with 4 overlapping Xience DES in 5/15.  DES to sequential SVG-OM1/PLOM in 9/19. LHC in 3/23 showed severe native coronary disease with 90% in-stent restenosis in the proximal RCA (patient has 2 layers of stent at this site). The sequential SVG-OM1/PLOM was patent, SVG-D was patent, and LIMA-LAD was patent. She underwent PTCA of pRCA. She has chronic angina, however exertional dyspnea has been more prominent. Recent echo showed EF further reduced, now 30-35%. Cath 9/24 showed 99% in-stent restenosis in the proximal RCA (known occluded SVG-RCA). Other coronary disease was stable. She underwent successful cutting balloon angioplasty to ISR in the RCA w/ only 20-30% distal residual stenosis. Chest pain improved, now only has atypical chest pain.  - Continue apixaban and Plavix (has been on chronic Plavix, neuro has wanted her to stay on this for cerebrovascular disease).  No bleeding issues. CBC and iron panel today. - Continue ranolazine  1000 mg bid.  - Off Imdur with headache. - Off Toprol XL with bradycardia.   - Continue Crestor, Leqvio and Zetia, good LDL in 9/24.  - Continue cardiac rehab.  2. Chronic systolic CHF: Echo in 1/19 with EF 55-60%, moderate diastolic dysfunction. Cardiomems placed 01/16/18.  Echo in 3/20 in setting of severe PNA with intubation showed EF 25-30%, mid-apical LV severe hypokinesis.  Possible stress (Takotsubo-type) cardiomyopathy related to severe medical illness versus progression of CAD.  She has baseline chronic LBBB which could play a role in cardiomyopathy as well (LBBB cardiomyopathy).  Repeat echo in 11/20 with EF up to 45-50%, moderately dysfunctional RV.  TEE in 12/20 with EF 50-55% with septal-lateral dyssynchrony and mildly dysfunctional RV.  Echo in 11/22 with EF 55% with normal RV.  cMRI in 1/23 with EF 48% and evidence for  prior inferior MI.  Echo 7/24 30-35%, G1DD, normal RV.  Echo was done today showing improved LV systolic function, LV mildly dilated with mild LVH, EF 45-50% on my read with septal-lateral dyssynchrony consistent with LBBB, mild RV dysfunction, mild mitral stenosis mean gradient 4 mmHg, no aortic stenosis. Chronic NYHA class III, this is confounded by obesity/deconditioning.  She is not volume overloaded on exam or Cardiomems. BMET and BNP today. - Continue torsemide 20 mg every 3rd day. - Continue Entresto 97/103 mg bid. - Continue spiro 25 mg daily. - Cannot tolerate SGLT2i. - Not on beta blocker due to bradycardia.  - EF is now out of range for CRT-D device (has wide LBBB).  3. Hyperlipidemia: She remains on Zetia, Crestor and Leqvio.  - Good lipids in 9/24.  4. Hypertension: BP controlled, but she continues with orthostatic symptoms. - Stop amlodipine. - I asked her to continue to check BP at home & log. 5. Cerebrovascular disease: She has history of CVA and had left carotid stent in 2/15. Followed by [facility].  Last MRA head showed severe stenosis of supraclinoid RICA and severe basilar artery stenosis.  - Neurology would like her to continue Plavix for this long-term despite concomitant apixaban use.  6. OHS/OSA: Cannot tolerate CPAP.  Uses oxygen at night.  7. Atrial fibrillation: Paroxysmal. 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 No recent palpitations, regular on exam today.  - Continue apixaban 5 mg bid.  8. Obesity: Body mass index is 42.97 kg/m. - She is now on Ozempic. 9. Mitral regurgitation: Mild on 1/25 echo.   10. Bradycardia: Now off Toprol XL.  Noted to have junctional beats at last visit in addition to NSR on ECG with slow rate. Zio patch 2/25 mostly NSR, rare PVCs.  11. Mitral annular mass: This is a well-circumscribed calcified posterior mitral annulus mass noted on echoes.  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 12. Depression: She has  struggled with this x years, worsening recently. I encouraged her to reach out to her PCP regarding dosage change for her buproprion and duloxetine.  Follow up in 3-4 months with Dr. Shirlee [name]  Signed, Jacklynn Ganong, FNP  04/12/2023  [facility] [address] [facility] [place] [address] [phone] (office) [phone] (fax)

## 2023-04-12 NOTE — Patient Instructions (Addendum)
 Thank you for coming in today  If you had labs drawn today, any labs that are abnormal the clinic will call you No news is good news  Medications: STOP Amlodipine  Follow up with your primary care for Wellbutrin and Cymbalta prescription  Follow up appointments:  Your physician recommends that you schedule a follow-up appointment in:  3-4 months With Dr. Earlean Shawl will receive a reminder letter in the mail a few months in advance. If you don't receive a letter, please call our office to schedule the follow-up appointment.    Do the following things EVERYDAY: Weigh yourself in the morning before breakfast. Write it down and keep it in a log. Take your medicines as prescribed Eat low salt foods--Limit salt (sodium) to 2000 mg per day.  Stay as active as you can everyday Limit all fluids for the day to less than 2 liters   At [facility], you and your health needs are our priority. As part of our continuing mission to provide you with exceptional heart care, we have created designated Provider Care Teams. These Care Teams include your primary Cardiologist (physician) and Advanced Practice Providers (APPs- Physician Assistants and Nurse Practitioners) who all work together to provide you with the care you need, when you need it.   You may see any of the following providers on your designated Care Team at your next follow up: Dr Arvilla Meres Dr Marca Ancona Dr. Marcos Eke, NP Robbie Lis, [place] [facility] [place], [place] Brynda Peon, NP Karle Plumber, PharmD   Please be sure to bring in all your medications bottles to every appointment.    Thank you for choosing [facility]  If you have any questions or concerns before your next appointment please send [place] a message through [place] or call our office at [phone].    TO LEAVE A MESSAGE FOR THE NURSE SELECT OPTION 2, PLEASE LEAVE A  MESSAGE INCLUDING: YOUR NAME DATE OF BIRTH CALL BACK NUMBER REASON FOR CALL**this is important as we prioritize the call backs  YOU WILL RECEIVE A CALL BACK THE SAME DAY AS LONG AS YOU CALL BEFORE 4:00 PM

## 2023-04-13 ENCOUNTER — Other Ambulatory Visit (HOSPITAL_COMMUNITY): Payer: Medicare Other

## 2023-04-13 ENCOUNTER — Other Ambulatory Visit (HOSPITAL_COMMUNITY): Payer: Self-pay | Admitting: Cardiology

## 2023-04-13 ENCOUNTER — Encounter (HOSPITAL_COMMUNITY)
Admission: RE | Admit: 2023-04-13 | Discharge: 2023-04-13 | Disposition: A | Discharge status: Home / Self Care | Payer: Medicare Other | Source: Ambulatory Visit | Attending: Cardiology

## 2023-04-13 DIAGNOSIS — Z9861 Coronary angioplasty status: Secondary | ICD-10-CM

## 2023-04-13 DIAGNOSIS — Z48812 Encounter for surgical aftercare following surgery on the circulatory system: Secondary | ICD-10-CM | POA: Diagnosis not present

## 2023-04-15 NOTE — Addendum Note (Signed)
 Encounter addended by: Howell Rucks, RDCS on: 04/15/2023 3:14 PM  Actions taken: Imaging Exam ended

## 2023-04-18 ENCOUNTER — Encounter (HOSPITAL_COMMUNITY)
Admission: RE | Admit: 2023-04-18 | Discharge: 2023-04-18 | Disposition: A | Discharge status: Home / Self Care | Payer: Medicare Other | Source: Ambulatory Visit | Attending: Cardiology | Admitting: Cardiology

## 2023-04-18 DIAGNOSIS — Z48812 Encounter for surgical aftercare following surgery on the circulatory system: Secondary | ICD-10-CM | POA: Diagnosis not present

## 2023-04-18 DIAGNOSIS — Z9861 Coronary angioplasty status: Secondary | ICD-10-CM

## 2023-04-20 ENCOUNTER — Encounter (HOSPITAL_COMMUNITY)
Admission: RE | Admit: 2023-04-20 | Discharge: 2023-04-20 | Disposition: A | Discharge status: Home / Self Care | Payer: Medicare Other | Source: Ambulatory Visit | Attending: Cardiology | Admitting: Cardiology

## 2023-04-20 DIAGNOSIS — Z48812 Encounter for surgical aftercare following surgery on the circulatory system: Secondary | ICD-10-CM | POA: Diagnosis not present

## 2023-04-20 DIAGNOSIS — Z9861 Coronary angioplasty status: Secondary | ICD-10-CM

## 2023-04-25 ENCOUNTER — Telehealth (HOSPITAL_COMMUNITY): Payer: Self-pay

## 2023-04-25 ENCOUNTER — Encounter (HOSPITAL_COMMUNITY): Admission: RE | Admit: 2023-04-25 | Payer: Medicare Other | Source: Ambulatory Visit

## 2023-04-25 ENCOUNTER — Telehealth (HOSPITAL_COMMUNITY): Payer: Self-pay | Admitting: *Deleted

## 2023-04-25 NOTE — Telephone Encounter (Signed)
 Follow-up call with [name] [name] at 1:14pm. The Covid test kit she has was purchased during the pandemic so instructed her to not use that testing kit. Advised to follow-up with PCP who may want to see her in office, she said she will call PCP. She shared she had a fever over the weekend and woke up this morning with a sore throat and feels somewhat achy.  Reinforced 48 hours symptom free before returning to cardiac rehab. Will cancel Wednesday appointment and she will follow-up later in the week as to her status.

## 2023-04-25 NOTE — Telephone Encounter (Signed)
 Patient called asking if the COVID test we are requesting can be an at-home test, and if she really has to be symptom-free for 48 hours.

## 2023-04-25 NOTE — Telephone Encounter (Signed)
 Left message for patient regarding request by Byrd Hesselbach for her to take a COVID test before coming back to class, due to c/o for sore throat today.

## 2023-04-25 NOTE — Telephone Encounter (Signed)
 Husband left message stating Lovey would not be in today's 11:30am class due to a sore throat.

## 2023-04-27 ENCOUNTER — Encounter (HOSPITAL_COMMUNITY): Payer: Medicare Other

## 2023-05-02 ENCOUNTER — Encounter (HOSPITAL_COMMUNITY): Admission: RE | Admit: 2023-05-02 | Payer: Medicare Other | Source: Ambulatory Visit

## 2023-05-02 ENCOUNTER — Telehealth (HOSPITAL_COMMUNITY): Payer: Self-pay

## 2023-05-02 NOTE — Telephone Encounter (Signed)
 Patient's husband left message stating [name] [name] is still sick with a virus and unable to attend class today. He says she had a fever on Saturday but no fever on Sunday and believes she will be symptom-free for 48 hours by Wednesday and ready to attend class then.

## 2023-05-03 NOTE — Progress Notes (Signed)
 Cardiac Individual Treatment Plan  Patient Details  Name: [name] [name] [name]: [id number] Date of Birth: Aug 19, 1945 Referring Provider:   Flowsheet Row INTENSIVE CARDIAC REHAB ORIENT from 03/10/2023 in [facility] for Heart, Vascular, & Lung Health  Referring Provider Dr. Bryan Lemma, MD       Initial Encounter Date:  Flowsheet Row INTENSIVE CARDIAC REHAB ORIENT from 03/10/2023 in [facility] for Heart, Vascular, & Lung Health  Date 03/10/23       Visit Diagnosis: 11/24/22 S/P PTCA RCA  Patient's Home Medications on Admission:  Current Outpatient Medications:    acetaminophen (TYLENOL) 500 MG tablet, Take 500 mg by mouth every 6 (six) hours as needed for mild pain, moderate pain, fever or headache., Disp: , Rfl:    albuterol (VENTOLIN HFA) 108 (90 Base) MCG/ACT inhaler, Inhale 2 puffs into the lungs every 4 (four) hours as needed for wheezing or shortness of breath. , Disp: , Rfl:    ALPRAZolam (XANAX) 0.5 MG tablet, Take 0.5-1 mg by mouth See admin instructions. Take 0.5 mg  morning at lunch and 1 mg at bedtime, Disp: , Rfl:    B Complex-C (B-COMPLEX WITH VITAMIN C) tablet, Take 1 tablet by mouth in the morning., Disp: , Rfl:    baclofen (LIORESAL) 10 MG tablet, Take 10 mg by mouth at bedtime., Disp: , Rfl:    bisacodyl (DULCOLAX) 5 MG EC tablet, Take 5 mg by mouth daily as needed for moderate constipation., Disp: , Rfl:    buPROPion (WELLBUTRIN XL) 300 MG 24 hr tablet, Take 300 mg by mouth daily., Disp: , Rfl:    carboxymethylcellulose (REFRESH PLUS) 0.5 % SOLN, Place 1 drop into both eyes in the morning, at noon, and at bedtime., Disp: , Rfl:    Cholecalciferol (VITAMIN D3) 50 MCG (2000 UT) capsule, Take 2,000 Units by mouth daily in the afternoon., Disp: , Rfl:    clopidogrel (PLAVIX) 75 MG tablet, Take 75 mg by mouth every morning., Disp: , Rfl:    DULoxetine (CYMBALTA) 30 MG capsule, Take 1 capsule (30 mg total) by mouth daily.,  Disp: 30 capsule, Rfl: 0   ELIQUIS 5 MG TABS tablet, TAKE ONE (1) TABLET BY MOUTH TWO (2) TIMES DAILY, Disp: 180 tablet, Rfl: 3   ezetimibe (ZETIA) 10 MG tablet, Take 1 tablet (10 mg total) by mouth at bedtime., Disp: 90 tablet, Rfl: 3   fluocinonide cream (LIDEX) 0.05 %, Apply 1 application. topically 2 (two) times daily as needed (irritation)., Disp: , Rfl:    Fluticasone-Salmeterol (ADVAIR) 250-50 MCG/DOSE AEPB, Inhale 1 puff into the lungs daily., Disp: , Rfl:    gabapentin (NEURONTIN) 600 MG tablet, Take 600-1,200 mg by mouth See admin instructions. Take 1 tablet (600 mg) by mouth in the morning & take 2 tablets (1200 mg) by mouth at bedtime, Disp: , Rfl:    inclisiran (LEQVIO) 284 MG/1.5ML SOSY injection, Inject 284 mg into the skin every 6 (six) months., Disp: , Rfl:    Insulin Glargine (LANTUS SOLOSTAR) 100 UNIT/ML Solostar Pen, Inject 0-8 Units into the skin at bedtime. Per sliding scale, Disp: , Rfl:    insulin lispro (HUMALOG) 100 UNIT/ML KwikPen, Inject 4-6 Units into the skin See admin instructions. Sliding scale Inject 8 units subcutaneously prior to breakfast and supper; add 4 units for CBG >200, Disp: , Rfl:    loperamide (IMODIUM A-D) 2 MG tablet, Take 2 mg by mouth 4 (four) times daily as needed for diarrhea or  loose stools., Disp: , Rfl:    Multiple Vitamin (MULTIVITAMIN WITH MINERALS) TABS tablet, Take 1 tablet by mouth every morning. Centrum - Women over 50, Disp: , Rfl:    Multiple Vitamins-Minerals (OCUVITE EYE HEALTH FORMULA) CAPS, Take 1 capsule by mouth every morning., Disp: , Rfl:    nitroGLYCERIN (NITROSTAT) 0.3 MG SL tablet, DISSOLVE 1 TABLET UNDER TONGUE EVERY 5 MINUTES FOR UP TO 3 DOSES AS NEEDED FOR CHEST PAIN, Disp: 100 tablet, Rfl: 0   ondansetron (ZOFRAN-ODT) 4 MG disintegrating tablet, Take 4 mg by mouth 2 (two) times daily., Disp: , Rfl:    OXYGEN, Inhale 2 L into the lungs at bedtime. Additional during the day, Disp: , Rfl:    pantoprazole (PROTONIX) 40 MG tablet,  Take 1 tablet (40 mg total) by mouth daily., Disp: 30 tablet, Rfl: 0   potassium chloride (KLOR-CON M) 10 MEQ tablet, Patient takes 1 tablet by mouth every 3rd day as directed (Patient not taking: Reported on 04/12/2023), Disp: 15 tablet, Rfl: 5   pyridOXINE (VITAMIN B6) 100 MG tablet, Take 100 mg by mouth every morning., Disp: , Rfl:    ranolazine (RANEXA) 1000 MG SR tablet, Take 1 tablet (1,000 mg total) by mouth 2 (two) times daily., Disp: 180 tablet, Rfl: 3   rosuvastatin (CRESTOR) 40 MG tablet, Take 1 tablet (40 mg total) by mouth daily., Disp: 90 tablet, Rfl: 3   sacubitril-valsartan (ENTRESTO) 97-103 MG, Take 1 tablet by mouth 2 (two) times daily., Disp: 180 tablet, Rfl: 3   Semaglutide, 2 MG/DOSE, (OZEMPIC, 2 MG/DOSE,) 8 MG/3ML SOPN, Inject 2 mg into the skin once a week., Disp: 3 mL, Rfl: 11   spironolactone (ALDACTONE) 25 MG tablet, Take 1 tablet (25 mg total) by mouth daily., Disp: 90 tablet, Rfl: 3   torsemide (DEMADEX) 20 MG tablet, Take 1 tablet by mouth every 3rd day as directed., Disp: 15 tablet, Rfl: 5   traZODone (DESYREL) 50 MG tablet, Take 100-150 mg by mouth at bedtime., Disp: , Rfl:    vitamin B-12 (CYANOCOBALAMIN) 1000 MCG tablet, Take 1,000 mcg by mouth every morning. , Disp: , Rfl:    zolpidem (AMBIEN) 10 MG tablet, Take 10 mg by mouth at bedtime., Disp: , Rfl:   Past Medical History: Past Medical History:  Diagnosis Date   Anemia    hx   Anxiety    Asthma    Basal cell carcinoma 05/2014   "left shoulder"   Bundle branch block, left    chronic/notes 07/18/2013   CHF (congestive heart failure) (HCC)    Chronic insomnia 05/06/2015   Chronic kidney disease    frequency, sees dr Camille Bal every 4 to 6 months (01/16/2018)   Chronic lower back pain    Claustrophobia    Common migraine 05/14/2014   Coronary artery disease    MI in 2001, 2002, 2006, 2011, 2014   Depression    Diabetic peripheral neuropathy (HCC) 01/12/2019   GERD (gastroesophageal reflux disease)     H/O hiatal hernia    Headache    "at least 2/month" (01/16/2018)   Heart murmur    Hyperlipidemia    Hypertension    Memory change 05/14/2014   Migraine    "5-6/year"  (01/16/2018)   Obesity 01-2010   Obstructive sleep apnea    "can't wear machine; I have claustrophobia" (01/16/2018), states she had a 2nd sleep study and does not have sleep apnea, her O2 decreases and now is on 2 L of O2 at night.  On home oxygen therapy    "2L at night and prn during daytime" (01/16/2018)   Osteoarthritis    "knees and hands" (01/16/2018)   Peripheral vascular disease (HCC)    ? numbness, tingling arms and legs   PONV (postoperative nausea and vomiting)    Stroke (HCC)    2014, 2015, 2016   Type II diabetes mellitus (HCC)    Ventral hernia    hx of    Tobacco Use: Social History   Tobacco Use  Smoking Status Never  Smokeless Tobacco Never    Labs: Review Flowsheet  More data exists      Latest Ref Rng & Units 04/20/2021 01/13/2022 08/26/2022 10/22/2022 10/30/2022  Labs for ITP Cardiac and Pulmonary Rehab  Cholestrol 0 - 200 mg/dL - 161  096  - 81   LDL (calc) 0 - 99 mg/dL - 045  83  - 25   HDL-C >40 mg/dL - 45  65  - 49   Trlycerides <150 mg/dL - 409  85  - 36   Hemoglobin A1c 4.8 - 5.6 % - - - 6.1  -  Bicarbonate 20.0 - 28.0 mmol/L 20.0 - 28.0 mmol/L 27.2  27.5  - - 28.2  28.7  -  TCO2 22 - 32 mmol/L 22 - 32 mmol/L 29  29  - - 30  30  -  O2 Saturation % % 79  80  - - 68  64  -    Details       Multiple values from one day are sorted in reverse-chronological order         Capillary Blood Glucose: Lab Results  Component Value Date   GLUCAP 156 (H) 03/21/2023   GLUCAP 135 (H) 03/21/2023   GLUCAP 130 (H) 03/16/2023   GLUCAP 136 (H) 03/16/2023   GLUCAP 157 (H) 03/14/2023     Exercise Target Goals: Exercise Program Goal: Individual exercise prescription set using results from initial 6 min walk test and THRR while considering  patient's activity barriers and  safety.   Exercise Prescription Goal: Initial exercise prescription builds to 30-45 minutes a day of aerobic activity, 2-3 days per week.  Home exercise guidelines will be given to patient during program as part of exercise prescription that the participant will acknowledge.  Activity Barriers & Risk Stratification:  Activity Barriers & Cardiac Risk Stratification - 03/10/23 1217       Activity Barriers & Cardiac Risk Stratification   Activity Barriers Joint Problems;Assistive Device;Deconditioning;Muscular Weakness;Arthritis;Back Problems;Shortness of Breath;Decreased Ventricular Function;Chest Pain/Angina;Balance Concerns;History of Falls;Neck/Spine Problems    Cardiac Risk Stratification High             6 Minute Walk:  6 Minute Walk     Row Name 03/10/23 1209         6 Minute Walk   Phase Initial     Distance 1312 feet     Walk Time 6 minutes     # of Rest Breaks 1     MPH 2.48     METS 1.4     RPE 11     Perceived Dyspnea  1     VO2 Peak 4.89     Symptoms Yes (comment)     Comments Supplemental Oxygen during walk test prior to CP. Right side CP 4 mins in, then radiated to right side, discontinued Stepper test 5 mins in due to cp 2/10. CP resolved within 2 mins. Back to intake room pt became dizzy, eyes closed  and unsteady, sat down BP 118/70, H2o recheck 5 mins 128/70. Dissy spell resolved within 1 min. AVG watts: 13, AVG MET's:1.5, Total steps: 471, AVG km: 0.40, AVG SPM: 89     Resting HR 50 bpm     Resting BP 142/64     Resting Oxygen Saturation  96 %     Exercise Oxygen Saturation  during 6 min walk 100 %     Max Ex. HR 62 bpm     Max Ex. BP 124/70     2 Minute Post BP 118/70              Oxygen Initial Assessment:   Oxygen Re-Evaluation:   Oxygen Discharge (Final Oxygen Re-Evaluation):   Initial Exercise Prescription:  Initial Exercise Prescription - 03/10/23 1200       Date of Initial Exercise RX and Referring Provider   Date 03/10/23     Referring Provider Dr. Bryan Lemma, MD    Expected Discharge Date 06/01/23      Oxygen   Oxygen Intermittent    Liters 2    Maintain Oxygen Saturation 88% or higher      NuStep   Level 1    SPM 60    Minutes 20    METs 1.3      Prescription Details   Frequency (times per week) 2    Duration Progress to 30 minutes of continuous aerobic without signs/symptoms of physical distress      Intensity   THRR 40-80% of Max Heartrate 58-116    Ratings of Perceived Exertion 11-13    Perceived Dyspnea 0-4      Progression   Progression Continue progressive overload as per policy without signs/symptoms or physical distress.      Resistance Training   Training Prescription Yes    Weight 1    Reps 10-15             Perform Capillary Blood Glucose checks as needed.  Exercise Prescription Changes:   Exercise Prescription Changes     Row Name 03/14/23 1400 04/04/23 1430 04/20/23 1500         Response to Exercise   Blood Pressure (Admit) 128/62 126/68 120/70     Blood Pressure (Exercise) 122/78 124/60 --     Blood Pressure (Exit) 114/76 120/60 104/70     Heart Rate (Admit) 45 bpm 63 bpm 55 bpm     Heart Rate (Exercise) 71 bpm 566 bpm 112 bpm     Heart Rate (Exit) 49 bpm 56 bpm 44 bpm     Oxygen Saturation (Admit) -- 99 % 98 %     Oxygen Saturation (Exercise) 97 % 98 % 97 %     Oxygen Saturation (Exit) -- 99 % 99 %     Rating of Perceived Exertion (Exercise) 10 11 11      Perceived Dyspnea (Exercise) 1 1 1      Symptoms SOB SOB SOB, RPD = 1     Comments Pt's first day in the CRP2 program Reviewed METs Reviewed METs and goals     Duration Progress to 30 minutes of  aerobic without signs/symptoms of physical distress Progress to 30 minutes of  aerobic without signs/symptoms of physical distress Continue with 30 min of aerobic exercise without signs/symptoms of physical distress.     Intensity THRR unchanged THRR unchanged THRR unchanged       Progression   Progression  Continue to progress workloads to maintain intensity without signs/symptoms of physical distress.  Continue to progress workloads to maintain intensity without signs/symptoms of physical distress. Continue to progress workloads to maintain intensity without signs/symptoms of physical distress.     Average METs 1.4 1.7 2.2       Resistance Training   Training Prescription Yes Yes No     Weight 1 lb 1 lb No weights on Wednesdays     Reps 10-15 10-15 --     Time 10 Minutes 10 Minutes --       Oxygen   Oxygen Continuous Continuous Continuous     Liters 2 2 2        NuStep   Level 1 1 1      SPM 71 98 114     Minutes 18 26 30      METs 1.4 1.7 2.2       Oxygen   Maintain Oxygen Saturation 88% or higher 88% or higher 88% or higher              Exercise Comments:   Exercise Comments     Row Name 03/14/23 1411 04/04/23 1430 04/20/23 1500       Exercise Comments Pt's first day in the CRP2 program. Pt is very deconditioned. Pt was able to complete 18 minutes on the nustep with several breaks. Pt denies any angina with activity. Pt did have SOB with RPD =1 on 2L/min via n/c (SaO2 = 97%).  Will continue to use Nustep as her exercise modality. Pt ambulates with rolling walker. Reviewed METs. [name] [name] is making slow progress. Her METs have increased and she is now up to 26 minutes form 18 on day one. She continues to take breaks to rest. Reviewed METs and goals. Pt has been able to increase her exericse time to 30 minutes, up from 18 on day one. Pt has to stop and take fewer breaks to rest. Pt also has been able to increse her SPM and METs on the Nustep. Pt is making good progress.              Exercise Goals and Review:   Exercise Goals Re-Evaluation :  Exercise Goals Re-Evaluation     Row Name 03/14/23 1409 04/20/23 1500           Exercise Goal Re-Evaluation   Exercise Goals Review Increase Physical Activity;Increase Strength and Stamina;Able to understand and use rate of  perceived exertion (RPE) scale;Knowledge and understanding of Target Heart Rate Range (THRR);Understanding of Exercise Prescription Increase Physical Activity;Increase Strength and Stamina;Able to understand and use rate of perceived exertion (RPE) scale;Knowledge and understanding of Target Heart Rate Range (THRR);Understanding of Exercise Prescription      Comments Pt's first day in the CRP2 program. Pt understands the exercise Rx, THRR and RPE scale. Reviewed METs and goals. Pt voices some imrpovement in her strength which is a goal.      Expected Outcomes Will continue to montior and progress exercise workloads as tolerated. Will continue to montior and progress exercise workloads as tolerated.               Discharge Exercise Prescription (Final Exercise Prescription Changes):  Exercise Prescription Changes - 04/20/23 1500       Response to Exercise   Blood Pressure (Admit) 120/70    Blood Pressure (Exit) 104/70    Heart Rate (Admit) 55 bpm    Heart Rate (Exercise) 112 bpm    Heart Rate (Exit) 44 bpm    Oxygen Saturation (Admit) 98 %    Oxygen Saturation (Exercise)  97 %    Oxygen Saturation (Exit) 99 %    Rating of Perceived Exertion (Exercise) 11    Perceived Dyspnea (Exercise) 1    Symptoms SOB, RPD = 1    Comments Reviewed METs and goals    Duration Continue with 30 min of aerobic exercise without signs/symptoms of physical distress.    Intensity THRR unchanged      Progression   Progression Continue to progress workloads to maintain intensity without signs/symptoms of physical distress.    Average METs 2.2      Resistance Training   Training Prescription No    Weight No weights on Wednesdays      Oxygen   Oxygen Continuous    Liters 2      NuStep   Level 1    SPM 114    Minutes 30    METs 2.2      Oxygen   Maintain Oxygen Saturation 88% or higher             Nutrition:  Target Goals: Understanding of nutrition guidelines, daily intake of sodium  1500mg , cholesterol 200mg , calories 30% from fat and 7% or less from saturated fats, daily to have 5 or more servings of fruits and vegetables.  Biometrics:  Pre Biometrics - 03/10/23 1221       Pre Biometrics   Height 5\' 1"  (1.549 m)    Weight 102.5 kg    Waist Circumference 45 inches    Hip Circumference 55 inches    Waist to Hip Ratio 0.82 %    BMI (Calculated) 42.72    Triceps Skinfold 44 mm    % Body Fat 54.5 %    Grip Strength 18 kg    Flexibility --   Bacck pain and issues   Single Leg Stand --   Pt uses walker             Nutrition Therapy Plan and Nutrition Goals:  Nutrition Therapy & Goals - 04/04/23 1521       Nutrition Therapy   Diet Heart Healthy Diet    Drug/Food Interactions Statins/Certain Fruits      Personal Nutrition Goals   Nutrition Goal Patient to identify strategies for reducing cardiovascular risk by attending the [employer] education and nutrition series weekly.   goal in progress.   Personal Goal #2 Patient to improve diet quality by using the plate method as a guide for meal planning to include lean protein/plant protein, fruits, vegetables, whole grains, nonfat dairy as part of a well-balanced diet.   goal in progress.   Personal Goal #3 Patient to identify strategies for weight loss of 0.5-2.0# per week.   goal in progress.   Comments Goals in progress. [name] [name] has completed cardiac rehab >4x in the past. She has medical history of CAD, CABG x5, CVA, CHF, carotid stent, DM2, HTN, OSA. Lipids are at goal. A1c is well controlled at 6.1, She is down 2.2# since starting with our program. Patient will continue to benefit from participation in intensive cardiac rehab for nutrition, exercise, and lifestyle modification.      Intervention Plan   Intervention Prescribe, educate and counsel regarding individualized specific dietary modifications aiming towards targeted core components such as weight, hypertension, lipid management, diabetes, heart failure  and other comorbidities.;Nutrition handout(s) given to patient.    Expected Outcomes Short Term Goal: Understand basic principles of dietary content, such as calories, fat, sodium, cholesterol and nutrients.;Long Term Goal: Adherence to prescribed nutrition plan.  Nutrition Assessments:  MEDIFICTS Score Key: >=70 Need to make dietary changes  40-70 Heart Healthy Diet <= 40 Therapeutic Level Cholesterol Diet   Flowsheet Row INTENSIVE CARDIAC REHAB from 03/14/2023 in [facility] for Heart, Vascular, & Lung Health  Picture Your Plate Total Score on Admission 74      Picture Your Plate Scores: <40 Unhealthy dietary pattern with much room for improvement. 41-50 Dietary pattern unlikely to meet recommendations for good health and room for improvement. 51-60 More healthful dietary pattern, with some room for improvement.  >60 Healthy dietary pattern, although there may be some specific behaviors that could be improved.    Nutrition Goals Re-Evaluation:  Nutrition Goals Re-Evaluation     Row Name 04/04/23 1521             Goals   Current Weight 223 lb 12.3 oz (101.5 kg)       Comment lipids WNL, LDL 25, Cr 1.57, GFR 24, Lpa 229, A1c 6.1       Expected Outcome Goals in progress. [name] [name] has completed cardiac rehab >4x in the past. She has medical history of CAD, CABG x5, CVA, CHF, carotid stent, DM2, HTN, OSA. Lipids are at goal. A1c is well controlled at 6.1, She is down 2.2# since starting with our program. Patient will continue to benefit from participation in intensive cardiac rehab for nutrition, exercise, and lifestyle modification.                Nutrition Goals Re-Evaluation:  Nutrition Goals Re-Evaluation     Row Name 04/04/23 1521             Goals   Current Weight 223 lb 12.3 oz (101.5 kg)       Comment lipids WNL, LDL 25, Cr 1.57, GFR 24, Lpa 229, A1c 6.1       Expected Outcome Goals in progress. [name] [name] has completed  cardiac rehab >4x in the past. She has medical history of CAD, CABG x5, CVA, CHF, carotid stent, DM2, HTN, OSA. Lipids are at goal. A1c is well controlled at 6.1, She is down 2.2# since starting with our program. Patient will continue to benefit from participation in intensive cardiac rehab for nutrition, exercise, and lifestyle modification.                Nutrition Goals Discharge (Final Nutrition Goals Re-Evaluation):  Nutrition Goals Re-Evaluation - 04/04/23 1521       Goals   Current Weight 223 lb 12.3 oz (101.5 kg)    Comment lipids WNL, LDL 25, Cr 1.57, GFR 24, Lpa 229, A1c 6.1    Expected Outcome Goals in progress. [name] [name] has completed cardiac rehab >4x in the past. She has medical history of CAD, CABG x5, CVA, CHF, carotid stent, DM2, HTN, OSA. Lipids are at goal. A1c is well controlled at 6.1, She is down 2.2# since starting with our program. Patient will continue to benefit from participation in intensive cardiac rehab for nutrition, exercise, and lifestyle modification.             Psychosocial: Target Goals: Acknowledge presence or absence of significant depression and/or stress, maximize coping skills, provide positive support system. Participant is able to verbalize types and ability to use techniques and skills needed for reducing stress and depression.  Initial Review & Psychosocial Screening:  Initial Psych Review & Screening - 03/10/23 1037       Initial Review   Current issues with Current Depression;History of Depression;Current Sleep Concerns;Current  Psychotropic Meds;Current Anxiety/Panic;Current Stress Concerns    Source of Stress Concerns Chronic Illness;Family;Transportation;Unable to participate in former interests or hobbies;Unable to perform yard/household activities    Comments [name] [name] says that she is depresseed. The anitdepressants that [name] [name] is taking is helping control her depression. [name] [name] says the sleep medication she takes helps her go to sleep.  [name] [name] admits to having difficulty staying asleep. [name] [name] says she feels at time she is a burdeon to her family. [name] [name] does not drive any longer. [name] [name] relies on her husband [name] [name] to take her out to appointments, Doctor's appointments, ETC. Lya denies the suicidal ideations. Angal denies the need for counselling at this time.      Family Dynamics   Good Support System? Yes   Mylani has her husband [name] [name], her daughter and two grandchildren for support.   Comments Elzena will have controlled or decreased depression upon completion of cardiac rehab.      Barriers   Psychosocial barriers to participate in program The patient should benefit from training in stress management and relaxation.;Psychosocial barriers identified (see note)      Screening Interventions   Interventions Encouraged to exercise;To provide support and resources with identified psychosocial needs;Provide feedback about the scores to participant    Expected Outcomes Long Term Goal: Stressors or current issues are controlled or eliminated.;Short Term goal: Identification and review with participant of any Quality of Life or Depression concerns found by scoring the questionnaire.;Long Term goal: The participant improves quality of Life and PHQ9 Scores as seen by post scores and/or verbalization of changes             Quality of Life Scores:  Quality of Life - 03/10/23 1222       Quality of Life   Select Quality of Life      Quality of Life Scores   Health/Function Pre 8.53 %    Socioeconomic Pre 27.5 %    Psych/Spiritual Pre 12 %    Family Pre 24 %    GLOBAL Pre 15.53 %            Scores of 19 and below usually indicate a poorer quality of life in these areas.  A difference of  2-3 points is a clinically meaningful difference.  A difference of 2-3 points in the total score of the Quality of Life Index has been associated with significant improvement in overall quality of life, self-image, physical symptoms, and  general health in studies assessing change in quality of life.  PHQ-9: Review Flowsheet  More data exists      03/10/2023 12/30/2021 10/26/2021 10/15/2021 10/12/2018  Depression screen PHQ 2/9  Decreased Interest 3 3 3 1 2   Down, Depressed, Hopeless 2 1 3  0 1  PHQ - 2 Score 5 4 6 1 3   Altered sleeping 2 3 1  - 1  Tired, decreased energy 3 3 3  - 1  Change in appetite 2 1 0 - 0  Feeling bad or failure about yourself  3 3 3  - 1  Trouble concentrating 2 1 2  - 1  Moving slowly or fidgety/restless 2 0 1 - 0  Suicidal thoughts 2 0 0 - 0  PHQ-9 Score 21 15 16  - 7  Difficult doing work/chores Somewhat difficult Very difficult Very difficult - Somewhat difficult   Interpretation of Total Score  Total Score Depression Severity:  1-4 = Minimal depression, 5-9 = Mild depression, 10-14 = Moderate depression, 15-19 = Moderately severe depression, 20-27 = Severe depression  Psychosocial Evaluation and Intervention:   Psychosocial Re-Evaluation:  Psychosocial Re-Evaluation     Row Name 03/14/23 1414 03/17/23 0804 03/31/23 1526         Psychosocial Re-Evaluation   Current issues with Current Anxiety/Panic;Current Stress Concerns;Current Depression;Current Sleep Concerns;History of Depression;Current Psychotropic Meds Current Anxiety/Panic;Current Stress Concerns;Current Depression;Current Sleep Concerns;History of Depression;Current Psychotropic Meds Current Anxiety/Panic;Current Stress Concerns;Current Depression;Current Sleep Concerns;History of Depression;Current Psychotropic Meds     Comments Will review quality of life in the upcoming week. [name] [name] denied having any increased concerns or stressors during exercise at cardiac rehab. Quality of life questionnaire reviewed.[name] [name] says that she is very dissatisfied with her health due to her various health conditions. [name] [name] says that she has chest pain daily with exertional activities.[name] [name] says her chest pain has improved since she had her PCI in  October. [name] [name] continues to have low energy. [name] [name] says she is limited to what she can do at home. [name] [name] says she relies on her husband to drive her to her various appointments and to the grocery store. [name] [name] says that the antidepressant she is taking is controlling her ongoing depression. [name] [name] has not voiced any increased concerns or psychological stressors at cardiac rehab.     Expected Outcomes -- [name] [name] will have controlled or decreased depression/ stressors upon completion of cardiac rehab. [name] [name] will have controlled or decreased depression/ stressors upon completion of cardiac rehab.     Interventions Stress management education;Relaxation education;Encouraged to attend Cardiac Rehabilitation for the exercise Stress management education;Relaxation education;Encouraged to attend Cardiac Rehabilitation for the exercise Stress management education;Relaxation education;Encouraged to attend Cardiac Rehabilitation for the exercise     Continue Psychosocial Services  Follow up required by staff Follow up required by staff Follow up required by staff       Initial Review   Source of Stress Concerns Chronic Illness;Unable to perform yard/household activities;Unable to participate in former interests or hobbies Chronic Illness;Unable to perform yard/household activities;Unable to participate in former interests or hobbies Chronic Illness;Unable to perform yard/household activities;Unable to participate in former interests or hobbies     Comments Will contoinue to monitor and offer support as needed. Will contoinue to monitor and offer support as needed. Will contoinue to monitor and offer support as needed.              Psychosocial Discharge (Final Psychosocial Re-Evaluation):  Psychosocial Re-Evaluation - 03/31/23 1526       Psychosocial Re-Evaluation   Current issues with Current Anxiety/Panic;Current Stress Concerns;Current Depression;Current Sleep Concerns;History of Depression;Current  Psychotropic Meds    Comments [name] [name] has not voiced any increased concerns or psychological stressors at cardiac rehab.    Expected Outcomes [name] [name] will have controlled or decreased depression/ stressors upon completion of cardiac rehab.    Interventions Stress management education;Relaxation education;Encouraged to attend Cardiac Rehabilitation for the exercise    Continue Psychosocial Services  Follow up required by staff      Initial Review   Source of Stress Concerns Chronic Illness;Unable to perform yard/household activities;Unable to participate in former interests or hobbies    Comments Will contoinue to monitor and offer support as needed.             Vocational Rehabilitation: Provide vocational rehab assistance to qualifying candidates.   Vocational Rehab Evaluation & Intervention:  Vocational Rehab - 03/10/23 1137       Initial Vocational Rehab Evaluation & Intervention   Assessment shows need for Vocational Rehabilitation No   [name] [name] is semi retired and does not need  vocational rehab at this time            Education: Education Goals: Education classes will be provided on a weekly basis, covering required topics. Participant will state understanding/return demonstration of topics presented.    Education     Row Name 03/14/23 1400     Education   Cardiac Education Topics Pritikin   Select Workshops     Workshops   Educator Exercise Physiologist   Select Exercise   Exercise Workshop Location manager and Fall Prevention   Instruction Review Code 1- Verbalizes Understanding   Class Start Time 1142   Class Stop Time 1224   Class Time Calculation (min) 42 min    Row Name 03/16/23 1400     Education   Cardiac Education Topics Pritikin   Customer service manager   Weekly Topic Adding Flavor - Sodium-Free   Instruction Review Code 1- Verbalizes Understanding   Class Start Time 1145   Class Stop Time 1225   Class  Time Calculation (min) 40 min    Row Name 03/21/23 1300     Education   Cardiac Education Topics Pritikin   Nurse, children's Exercise Physiologist   Select Psychosocial   Psychosocial Healthy Minds, Bodies, Hearts   Instruction Review Code 1- Verbalizes Understanding   Class Start Time 1155   Class Stop Time 1240   Class Time Calculation (min) 45 min    Row Name 03/23/23 1200     Education   Cardiac Education Topics Pritikin   Set designer Exercise Physiologist   Weekly Topic Fast and Healthy Breakfasts   Instruction Review Code 1- Verbalizes Understanding   Class Start Time 1143   Class Stop Time 1215   Class Time Calculation (min) 32 min    Row Name 03/28/23 1300     Education   Cardiac Education Topics Pritikin   Select Core Videos     Core Videos   Educator Nurse   Select Nutrition   Nutrition Becoming a Pritikin Chef   Instruction Review Code 1- Verbalizes Understanding   Class Start Time 1150   Class Stop Time 1226   Class Time Calculation (min) 36 min    Row Name 03/30/23 1300     Education   Cardiac Education Topics Pritikin   Secondary school teacher School   Educator Dietitian;Nurse   Weekly Topic Personalizing Your Pritikin Plate   Instruction Review Code 1- Verbalizes Understanding   Class Start Time 1149   Class Stop Time 1233   Class Time Calculation (min) 44 min    Row Name 04/04/23 1600     Education   Cardiac Education Topics Pritikin   Select Workshops     Workshops   Educator Exercise Physiologist   Select Psychosocial   Psychosocial Workshop Recognizing and Reducing Stress   Instruction Review Code 1- Verbalizes Understanding   Class Start Time 1145   Class Stop Time 1228   Class Time Calculation (min) 43 min    Row Name 04/06/23 1400     Education   Cardiac Education Topics Pritikin   Optometrist   Weekly Topic [employer] Desserts   Instruction Review Code 1- Verbalizes Understanding   Class Start Time 1145   Class  Stop Time 1222   Class Time Calculation (min) 37 min    Row Name 04/11/23 1300     Education   Cardiac Education Topics Pritikin   Geographical information systems officer Exercise   Exercise Workshop Exercise Basics: Building Your Action Plan   Instruction Review Code 1- Verbalizes Understanding   Class Start Time 1146   Class Stop Time 1235   Class Time Calculation (min) 49 min    Row Name 04/13/23 1600     Education   Cardiac Education Topics Pritikin   Customer service manager   Weekly Topic Efficiency Cooking - Meals in a Snap   Instruction Review Code 1- Verbalizes Understanding   Class Start Time 1145   Class Stop Time 1225   Class Time Calculation (min) 40 min    Row Name 04/18/23 1500     Education   Cardiac Education Topics Pritikin   Glass blower/designer Nutrition   Nutrition Workshop Targeting Your Nutrition Priorities   Instruction Review Code 1- Verbalizes Understanding   Class Start Time 1150   Class Stop Time 1222   Class Time Calculation (min) 32 min    Row Name 04/20/23 1500     Education   Cardiac Education Topics Pritikin   Orthoptist   Educator Dietitian   Weekly Topic One-Pot Wonders   Instruction Review Code 1- Verbalizes Understanding   Class Start Time 1145   Class Stop Time 1227   Class Time Calculation (min) 42 min            Core Videos: Exercise    Move It!  Clinical staff conducted group or individual video education with verbal and written material and guidebook.  Patient learns the recommended Pritikin exercise program. Exercise with the goal of living a long, healthy life. Some of the health benefits of exercise include controlled diabetes,  healthier blood pressure levels, improved cholesterol levels, improved heart and lung capacity, improved sleep, and better body composition. Everyone should speak with their doctor before starting or changing an exercise routine.  Biomechanical Limitations Clinical staff conducted group or individual video education with verbal and written material and guidebook.  Patient learns how biomechanical limitations can impact exercise and how we can mitigate and possibly overcome limitations to have an impactful and balanced exercise routine.  Body Composition Clinical staff conducted group or individual video education with verbal and written material and guidebook.  Patient learns that body composition (ratio of muscle mass to fat mass) is a key component to assessing overall fitness, rather than body weight alone. Increased fat mass, especially visceral belly fat, can put [place] at increased risk for metabolic syndrome, type 2 diabetes, heart disease, and even death. It is recommended to combine diet and exercise (cardiovascular and resistance training) to improve your body composition. Seek guidance from your physician and exercise physiologist before implementing an exercise routine.  Exercise Action Plan Clinical staff conducted group or individual video education with verbal and written material and guidebook.  Patient learns the recommended strategies to achieve and enjoy long-term exercise adherence, including variety, self-motivation, self-efficacy, and positive decision making. Benefits of exercise include fitness, good health, weight management, more energy, better sleep, less stress, and overall well-being.  Medical   Heart Disease Risk Reduction Clinical staff  conducted group or individual video education with verbal and written material and guidebook.  Patient learns our heart is our most vital organ as it circulates oxygen, nutrients, white blood cells, and hormones throughout the entire body,  and carries waste away. Data supports a plant-based eating plan like the Pritikin Program for its effectiveness in slowing progression of and reversing heart disease. The video provides a number of recommendations to address heart disease.   Metabolic Syndrome and Belly Fat  Clinical staff conducted group or individual video education with verbal and written material and guidebook.  Patient learns what metabolic syndrome is, how it leads to heart disease, and how one can reverse it and keep it from coming back. You have metabolic syndrome if you have 3 of the following 5 criteria: abdominal obesity, high blood pressure, high triglycerides, low HDL cholesterol, and high blood sugar.  Hypertension and Heart Disease Clinical staff conducted group or individual video education with verbal and written material and guidebook.  Patient learns that high blood pressure, or hypertension, is very common in the [place]. Hypertension is largely due to excessive salt intake, but other important risk factors include being overweight, physical inactivity, drinking too much alcohol, smoking, and not eating enough potassium from fruits and vegetables. High blood pressure is a leading risk factor for heart attack, stroke, congestive heart failure, dementia, kidney failure, and premature death. Long-term effects of excessive salt intake include stiffening of the arteries and thickening of heart muscle and organ damage. Recommendations include ways to reduce hypertension and the risk of heart disease.  Diseases of Our Time - Focusing on Diabetes Clinical staff conducted group or individual video education with verbal and written material and guidebook.  Patient learns why the best way to stop diseases of our time is prevention, through food and other lifestyle changes. Medicine (such as prescription pills and surgeries) is often only a Band-Aid on the problem, not a long-term solution. Most common diseases of our time  include obesity, type 2 diabetes, hypertension, heart disease, and cancer. The Pritikin Program is recommended and has been proven to help reduce, reverse, and/or prevent the damaging effects of metabolic syndrome.  Nutrition   Overview of the Pritikin Eating Plan  Clinical staff conducted group or individual video education with verbal and written material and guidebook.  Patient learns about the Pritikin Eating Plan for disease risk reduction. The Pritikin Eating Plan emphasizes a wide variety of unrefined, minimally-processed carbohydrates, like fruits, vegetables, whole grains, and legumes. Go, Caution, and Stop food choices are explained. Plant-based and lean animal proteins are emphasized. Rationale provided for low sodium intake for blood pressure control, low added sugars for blood sugar stabilization, and low added fats and oils for coronary artery disease risk reduction and weight management.  Calorie Density  Clinical staff conducted group or individual video education with verbal and written material and guidebook.  Patient learns about calorie density and how it impacts the Pritikin Eating Plan. Knowing the characteristics of the food you choose will help you decide whether those foods will lead to weight gain or weight loss, and whether you want to consume more or less of them. Weight loss is usually a side effect of the Pritikin Eating Plan because of its focus on low calorie-dense foods.  Label Reading  Clinical staff conducted group or individual video education with verbal and written material and guidebook.  Patient learns about the Pritikin recommended label reading guidelines and corresponding recommendations regarding calorie density, added sugars, sodium  content, and whole grains.  Dining Out - Part 1  Clinical staff conducted group or individual video education with verbal and written material and guidebook.  Patient learns that restaurant meals can be sabotaging because they  can be so high in calories, fat, sodium, and/or sugar. Patient learns recommended strategies on how to positively address this and avoid unhealthy pitfalls.  Facts on Fats  Clinical staff conducted group or individual video education with verbal and written material and guidebook.  Patient learns that lifestyle modifications can be just as effective, if not more so, as many medications for lowering your risk of heart disease. A Pritikin lifestyle can help to reduce your risk of inflammation and atherosclerosis (cholesterol build-up, or plaque, in the artery walls). Lifestyle interventions such as dietary choices and physical activity address the cause of atherosclerosis. A review of the types of fats and their impact on blood cholesterol levels, along with dietary recommendations to reduce fat intake is also included.  Nutrition Action Plan  Clinical staff conducted group or individual video education with verbal and written material and guidebook.  Patient learns how to incorporate Pritikin recommendations into their lifestyle. Recommendations include planning and keeping personal health goals in mind as an important part of their success.  Healthy Mind-Set    Healthy Minds, Bodies, Hearts  Clinical staff conducted group or individual video education with verbal and written material and guidebook.  Patient learns how to identify when they are stressed. Video will discuss the impact of that stress, as well as the many benefits of stress management. Patient will also be introduced to stress management techniques. The way we think, act, and feel has an impact on our hearts.  How Our Thoughts Can Heal Our Hearts  Clinical staff conducted group or individual video education with verbal and written material and guidebook.  Patient learns that negative thoughts can cause depression and anxiety. This can result in negative lifestyle behavior and serious health problems. Cognitive behavioral therapy is an  effective method to help control our thoughts in order to change and improve our emotional outlook.  Additional Videos:  Exercise    Improving Performance  Clinical staff conducted group or individual video education with verbal and written material and guidebook.  Patient learns to use a non-linear approach by alternating intensity levels and lengths of time spent exercising to help burn more calories and lose more body fat. Cardiovascular exercise helps improve heart health, metabolism, hormonal balance, blood sugar control, and recovery from fatigue. Resistance training improves strength, endurance, balance, coordination, reaction time, metabolism, and muscle mass. Flexibility exercise improves circulation, posture, and balance. Seek guidance from your physician and exercise physiologist before implementing an exercise routine and learn your capabilities and proper form for all exercise.  Introduction to Yoga  Clinical staff conducted group or individual video education with verbal and written material and guidebook.  Patient learns about yoga, a discipline of the coming together of mind, breath, and body. The benefits of yoga include improved flexibility, improved range of motion, better posture and core strength, increased lung function, weight loss, and positive self-image. Yoga's heart health benefits include lowered blood pressure, healthier heart rate, decreased cholesterol and triglyceride levels, improved immune function, and reduced stress. Seek guidance from your physician and exercise physiologist before implementing an exercise routine and learn your capabilities and proper form for all exercise.  Medical   Aging: Enhancing Your Quality of Life  Clinical staff conducted group or individual video education with verbal and written material  and guidebook.  Patient learns key strategies and recommendations to stay in good physical health and enhance quality of life, such as prevention  strategies, having an advocate, securing a Health Care Proxy and Power of Attorney, and keeping a list of medications and system for tracking them. It also discusses how to avoid risk for bone loss.  Biology of Weight Control  Clinical staff conducted group or individual video education with verbal and written material and guidebook.  Patient learns that weight gain occurs because we consume more calories than we burn (eating more, moving less). Even if your body weight is normal, you may have higher ratios of fat compared to muscle mass. Too much body fat puts you at increased risk for cardiovascular disease, heart attack, stroke, type 2 diabetes, and obesity-related cancers. In addition to exercise, following the Pritikin Eating Plan can help reduce your risk.  Decoding Lab Results  Clinical staff conducted group or individual video education with verbal and written material and guidebook.  Patient learns that lab test reflects one measurement whose values change over time and are influenced by many factors, including medication, stress, sleep, exercise, food, hydration, pre-existing medical conditions, and more. It is recommended to use the knowledge from this video to become more involved with your lab results and evaluate your numbers to speak with your doctor.   Diseases of Our Time - Overview  Clinical staff conducted group or individual video education with verbal and written material and guidebook.  Patient learns that according to the CDC, 50% to 70% of chronic diseases (such as obesity, type 2 diabetes, elevated lipids, hypertension, and heart disease) are avoidable through lifestyle improvements including healthier food choices, listening to satiety cues, and increased physical activity.  Sleep Disorders Clinical staff conducted group or individual video education with verbal and written material and guidebook.  Patient learns how good quality and duration of sleep are important to  overall health and well-being. Patient also learns about sleep disorders and how they impact health along with recommendations to address them, including discussing with a physician.  Nutrition  Dining Out - Part 2 Clinical staff conducted group or individual video education with verbal and written material and guidebook.  Patient learns how to plan ahead and communicate in order to maximize their dining experience in a healthy and nutritious manner. Included are recommended food choices based on the type of restaurant the patient is visiting.   Fueling a Banker conducted group or individual video education with verbal and written material and guidebook.  There is a strong connection between our food choices and our health. Diseases like obesity and type 2 diabetes are very prevalent and are in large-part due to lifestyle choices. The Pritikin Eating Plan provides plenty of food and hunger-curbing satisfaction. It is easy to follow, affordable, and helps reduce health risks.  Menu Workshop  Clinical staff conducted group or individual video education with verbal and written material and guidebook.  Patient learns that restaurant meals can sabotage health goals because they are often packed with calories, fat, sodium, and sugar. Recommendations include strategies to plan ahead and to communicate with the manager, chef, or server to help order a healthier meal.  Planning Your Eating Strategy  Clinical staff conducted group or individual video education with verbal and written material and guidebook.  Patient learns about the Pritikin Eating Plan and its benefit of reducing the risk of disease. The Pritikin Eating Plan does not focus on calories. Instead, it  emphasizes high-quality, nutrient-rich foods. By knowing the characteristics of the foods, we choose, we can determine their calorie density and make informed decisions.  Targeting Your Nutrition Priorities  Clinical staff  conducted group or individual video education with verbal and written material and guidebook.  Patient learns that lifestyle habits have a tremendous impact on disease risk and progression. This video provides eating and physical activity recommendations based on your personal health goals, such as reducing LDL cholesterol, losing weight, preventing or controlling type 2 diabetes, and reducing high blood pressure.  Vitamins and Minerals  Clinical staff conducted group or individual video education with verbal and written material and guidebook.  Patient learns different ways to obtain key vitamins and minerals, including through a recommended healthy diet. It is important to discuss all supplements you take with your doctor.   Healthy Mind-Set    Smoking Cessation  Clinical staff conducted group or individual video education with verbal and written material and guidebook.  Patient learns that cigarette smoking and tobacco addiction pose a serious health risk which affects millions of people. Stopping smoking will significantly reduce the risk of heart disease, lung disease, and many forms of cancer. Recommended strategies for quitting are covered, including working with your doctor to develop a successful plan.  Culinary   Becoming a Set designer conducted group or individual video education with verbal and written material and guidebook.  Patient learns that cooking at home can be healthy, cost-effective, quick, and puts them in control. Keys to cooking healthy recipes will include looking at your recipe, assessing your equipment needs, planning ahead, making it simple, choosing cost-effective seasonal ingredients, and limiting the use of added fats, salts, and sugars.  Cooking - Breakfast and Snacks  Clinical staff conducted group or individual video education with verbal and written material and guidebook.  Patient learns how important breakfast is to satiety and nutrition  through the entire day. Recommendations include key foods to eat during breakfast to help stabilize blood sugar levels and to prevent overeating at meals later in the day. Planning ahead is also a key component.  Cooking - Educational psychologist conducted group or individual video education with verbal and written material and guidebook.  Patient learns eating strategies to improve overall health, including an approach to cook more at home. Recommendations include thinking of animal protein as a side on your plate rather than center stage and focusing instead on lower calorie dense options like vegetables, fruits, whole grains, and plant-based proteins, such as beans. Making sauces in large quantities to freeze for later and leaving the skin on your vegetables are also recommended to maximize your experience.  Cooking - Healthy Salads and Dressing Clinical staff conducted group or individual video education with verbal and written material and guidebook.  Patient learns that vegetables, fruits, whole grains, and legumes are the foundations of the Pritikin Eating Plan. Recommendations include how to incorporate each of these in flavorful and healthy salads, and how to create homemade salad dressings. Proper handling of ingredients is also covered. Cooking - Soups and [employer] - Soups and Desserts Clinical staff conducted group or individual video education with verbal and written material and guidebook.  Patient learns that Pritikin soups and desserts make for easy, nutritious, and delicious snacks and meal components that are low in sodium, fat, sugar, and calorie density, while high in vitamins, minerals, and filling fiber. Recommendations include simple and healthy ideas for soups and desserts.  Overview     The Pritikin Solution Program Overview Clinical staff conducted group or individual video education with verbal and written material and guidebook.  Patient learns that the  results of the Pritikin Program have been documented in more than 100 articles published in peer-reviewed journals, and the benefits include reducing risk factors for (and, in some cases, even reversing) high cholesterol, high blood pressure, type 2 diabetes, obesity, and more! An overview of the three key pillars of the Pritikin Program will be covered: eating well, doing regular exercise, and having a healthy mind-set.  WORKSHOPS  Exercise: Exercise Basics: Building Your Action Plan Clinical staff led group instruction and group discussion with PowerPoint presentation and patient guidebook. To enhance the learning environment the use of posters, models and videos may be added. At the conclusion of this workshop, patients will comprehend the difference between physical activity and exercise, as well as the benefits of incorporating both, into their routine. Patients will understand the FITT (Frequency, Intensity, Time, and Type) principle and how to use it to build an exercise action plan. In addition, safety concerns and other considerations for exercise and cardiac rehab will be addressed by the presenter. The purpose of this lesson is to promote a comprehensive and effective weekly exercise routine in order to improve patients' overall level of fitness.   Managing Heart Disease: Your Path to a Healthier Heart Clinical staff led group instruction and group discussion with PowerPoint presentation and patient guidebook. To enhance the learning environment the use of posters, models and videos may be added.At the conclusion of this workshop, patients will understand the anatomy and physiology of the heart. Additionally, they will understand how Pritikin's three pillars impact the risk factors, the progression, and the management of heart disease.  The purpose of this lesson is to provide a high-level overview of the heart, heart disease, and how the Pritikin lifestyle positively impacts risk  factors.  Exercise Biomechanics Clinical staff led group instruction and group discussion with PowerPoint presentation and patient guidebook. To enhance the learning environment the use of posters, models and videos may be added. Patients will learn how the structural parts of their bodies function and how these functions impact their daily activities, movement, and exercise. Patients will learn how to promote a neutral spine, learn how to manage pain, and identify ways to improve their physical movement in order to promote healthy living. The purpose of this lesson is to expose patients to common physical limitations that impact physical activity. Participants will learn practical ways to adapt and manage aches and pains, and to minimize their effect on regular exercise. Patients will learn how to maintain good posture while sitting, walking, and lifting.  Balance Training and Fall Prevention  Clinical staff led group instruction and group discussion with PowerPoint presentation and patient guidebook. To enhance the learning environment the use of posters, models and videos may be added. At the conclusion of this workshop, patients will understand the importance of their sensorimotor skills (vision, proprioception, and the vestibular system) in maintaining their ability to balance as they age. Patients will apply a variety of balancing exercises that are appropriate for their current level of function. Patients will understand the common causes for poor balance, possible solutions to these problems, and ways to modify their physical environment in order to minimize their fall risk. The purpose of this lesson is to teach patients about the importance of maintaining balance as they age and ways to minimize their risk of falling.  WORKSHOPS  Nutrition:  Fueling a Ship broker led group instruction and group discussion with PowerPoint presentation and patient guidebook. To enhance  the learning environment the use of posters, models and videos may be added. Patients will review the foundational principles of the Pritikin Eating Plan and understand what constitutes a serving size in each of the food groups. Patients will also learn Pritikin-friendly foods that are better choices when away from home and review make-ahead meal and snack options. Calorie density will be reviewed and applied to three nutrition priorities: weight maintenance, weight loss, and weight gain. The purpose of this lesson is to reinforce (in a group setting) the key concepts around what patients are recommended to eat and how to apply these guidelines when away from home by planning and selecting Pritikin-friendly options. Patients will understand how calorie density may be adjusted for different weight management goals.  Mindful Eating  Clinical staff led group instruction and group discussion with PowerPoint presentation and patient guidebook. To enhance the learning environment the use of posters, models and videos may be added. Patients will briefly review the concepts of the Pritikin Eating Plan and the importance of low-calorie dense foods. The concept of mindful eating will be introduced as well as the importance of paying attention to internal hunger signals. Triggers for non-hunger eating and techniques for dealing with triggers will be explored. The purpose of this lesson is to provide patients with the opportunity to review the basic principles of the Pritikin Eating Plan, discuss the value of eating mindfully and how to measure internal cues of hunger and fullness using the Hunger Scale. Patients will also discuss reasons for non-hunger eating and learn strategies to use for controlling emotional eating.  Targeting Your Nutrition Priorities Clinical staff led group instruction and group discussion with PowerPoint presentation and patient guidebook. To enhance the learning environment the use of posters,  models and videos may be added. Patients will learn how to determine their genetic susceptibility to disease by reviewing their family history. Patients will gain insight into the importance of diet as part of an overall healthy lifestyle in mitigating the impact of genetics and other environmental insults. The purpose of this lesson is to provide patients with the opportunity to assess their personal nutrition priorities by looking at their family history, their own health history and current risk factors. Patients will also be able to discuss ways of prioritizing and modifying the Pritikin Eating Plan for their highest risk areas  Menu  Clinical staff led group instruction and group discussion with PowerPoint presentation and patient guidebook. To enhance the learning environment the use of posters, models and videos may be added. Using menus brought in from [employer], or printed from [employer], patients will apply the Pritikin dining out guidelines that were presented in the [employer] video. Patients will also be able to practice these guidelines in a variety of provided scenarios. The purpose of this lesson is to provide patients with the opportunity to practice hands-on learning of the Pritikin Dining Out guidelines with actual menus and practice scenarios.  Label Reading Clinical staff led group instruction and group discussion with PowerPoint presentation and patient guidebook. To enhance the learning environment the use of posters, models and videos may be added. Patients will review and discuss the Pritikin label reading guidelines presented in Pritikin's Label Reading Educational series video. Using fool labels brought in from local grocery stores and markets, patients will apply the label reading guidelines and determine if  the packaged food meet the Pritikin guidelines. The purpose of this lesson is to provide patients with the opportunity to review, discuss, and  practice hands-on learning of the Pritikin Label Reading guidelines with actual packaged food labels. Cooking School  Pritikin's [employer] are designed to teach patients ways to prepare quick, simple, and affordable recipes at home. The importance of nutrition's role in chronic disease risk reduction is reflected in its emphasis in the overall Pritikin program. By learning how to prepare essential core Pritikin Eating Plan recipes, patients will increase control over what they eat; be able to customize the flavor of foods without the use of added salt, sugar, or fat; and improve the quality of the food they consume. By learning a set of core recipes which are easily assembled, quickly prepared, and affordable, patients are more likely to prepare more healthy foods at home. These workshops focus on convenient breakfasts, simple entres, side dishes, and desserts which can be prepared with minimal effort and are consistent with nutrition recommendations for cardiovascular risk reduction. Cooking [employer] are taught by a Armed forces logistics/support/administrative officer (RD) who has been trained by the [employer]. The chef or RD has a clear understanding of the importance of minimizing - if not completely eliminating - added fat, sugar, and sodium in recipes. Throughout the series of Cooking School Workshop sessions, patients will learn about healthy ingredients and efficient methods of cooking to build confidence in their capability to prepare    Cooking School weekly topics:  Adding Flavor- Sodium-Free  Fast and Healthy Breakfasts  Powerhouse Plant-Based Proteins  Satisfying Salads and Dressings  Simple Sides and Sauces  International Cuisine-Spotlight on the [employer] Zones  Delicious Desserts  Savory Soups  [employer] - Meals in a Astronomer Appetizers and Snacks  Comforting Weekend Breakfasts  One-Pot Wonders   Fast Evening Meals  Landscape architect Your  Pritikin Plate  WORKSHOPS   Healthy Mindset (Psychosocial):  Focused Goals, Sustainable Changes Clinical staff led group instruction and group discussion with PowerPoint presentation and patient guidebook. To enhance the learning environment the use of posters, models and videos may be added. Patients will be able to apply effective goal setting strategies to establish at least one personal goal, and then take consistent, meaningful action toward that goal. They will learn to identify common barriers to achieving personal goals and develop strategies to overcome them. Patients will also gain an understanding of how our mind-set can impact our ability to achieve goals and the importance of cultivating a positive and growth-oriented mind-set. The purpose of this lesson is to provide patients with a deeper understanding of how to set and achieve personal goals, as well as the tools and strategies needed to overcome common obstacles which may arise along the way.  From Head to Heart: The Power of a Healthy Outlook  Clinical staff led group instruction and group discussion with PowerPoint presentation and patient guidebook. To enhance the learning environment the use of posters, models and videos may be added. Patients will be able to recognize and describe the impact of emotions and mood on physical health. They will discover the importance of self-care and explore self-care practices which may work for them. Patients will also learn how to utilize the 4 C's to cultivate a healthier outlook and better manage stress and challenges. The purpose of this lesson is to demonstrate to patients how a healthy outlook is an essential part of maintaining good health, especially  as they continue their cardiac rehab journey.  Healthy Sleep for a Healthy Heart Clinical staff led group instruction and group discussion with PowerPoint presentation and patient guidebook. To enhance the learning environment the use of  posters, models and videos may be added. At the conclusion of this workshop, patients will be able to demonstrate knowledge of the importance of sleep to overall health, well-being, and quality of life. They will understand the symptoms of, and treatments for, common sleep disorders. Patients will also be able to identify daytime and nighttime behaviors which impact sleep, and they will be able to apply these tools to help manage sleep-related challenges. The purpose of this lesson is to provide patients with a general overview of sleep and outline the importance of quality sleep. Patients will learn about a few of the most common sleep disorders. Patients will also be introduced to the concept of "sleep hygiene," and discover ways to self-manage certain sleeping problems through simple daily behavior changes. Finally, the workshop will motivate patients by clarifying the links between quality sleep and their goals of heart-healthy living.   Recognizing and Reducing Stress Clinical staff led group instruction and group discussion with PowerPoint presentation and patient guidebook. To enhance the learning environment the use of posters, models and videos may be added. At the conclusion of this workshop, patients will be able to understand the types of stress reactions, differentiate between acute and chronic stress, and recognize the impact that chronic stress has on their health. They will also be able to apply different coping mechanisms, such as reframing negative self-talk. Patients will have the opportunity to practice a variety of stress management techniques, such as deep abdominal breathing, progressive muscle relaxation, and/or guided imagery.  The purpose of this lesson is to educate patients on the role of stress in their lives and to provide healthy techniques for coping with it.  Learning Barriers/Preferences:  Learning Barriers/Preferences - 03/10/23 1226       Learning Barriers/Preferences    Learning Barriers Exercise Concerns;Sight   Pt wears reading glasses, dizzy spells, poor balance, assistive device            Education Topics:  Knowledge Questionnaire Score:  Knowledge Questionnaire Score - 03/10/23 1227       Knowledge Questionnaire Score   Pre Score 21/24             Core Components/Risk Factors/Patient Goals at Admission:  Personal Goals and Risk Factors at Admission - 03/10/23 1228       Core Components/Risk Factors/Patient Goals on Admission    Weight Management Yes;Obesity;Weight Loss    Intervention Weight Management: Develop a combined nutrition and exercise program designed to reach desired caloric intake, while maintaining appropriate intake of nutrient and fiber, sodium and fats, and appropriate energy expenditure required for the weight goal.;Weight Management: Provide education and appropriate resources to help participant work on and attain dietary goals.;Weight Management/Obesity: Establish reasonable short term and long term weight goals.;Obesity: Provide education and appropriate resources to help participant work on and attain dietary goals.    Expected Outcomes Long Term: Adherence to nutrition and physical activity/exercise program aimed toward attainment of established weight goal;Short Term: Continue to assess and modify interventions until short term weight is achieved;Weight Maintenance: Understanding of the daily nutrition guidelines, which includes 25-35% calories from fat, 7% or less cal from saturated fats, less than 200mg  cholesterol, less than 1.5gm of sodium, & 5 or more servings of fruits and vegetables daily;Weight Loss: Understanding of general recommendations  for a balanced deficit meal plan, which promotes 1-2 lb weight loss per week and includes a negative energy balance of [phone] kcal/d;Understanding recommendations for meals to include 15-35% energy as protein, 25-35% energy from fat, 35-60% energy from carbohydrates, less than  200mg  of dietary cholesterol, 20-35 gm of total fiber daily;Understanding of distribution of calorie intake throughout the day with the consumption of 4-5 meals/snacks    Diabetes Yes    Intervention Provide education about signs/symptoms and action to take for hypo/hyperglycemia.;Provide education about proper nutrition, including hydration, and aerobic/resistive exercise prescription along with prescribed medications to achieve blood glucose in normal ranges: Fasting glucose 65-99 mg/dL    Expected Outcomes Short Term: Participant verbalizes understanding of the signs/symptoms and immediate care of hyper/hypoglycemia, proper foot care and importance of medication, aerobic/resistive exercise and nutrition plan for blood glucose control.;Long Term: Attainment of HbA1C < 7%.    Heart Failure Yes    Intervention Provide a combined exercise and nutrition program that is supplemented with education, support and counseling about heart failure. Directed toward relieving symptoms such as shortness of breath, decreased exercise tolerance, and extremity edema.    Expected Outcomes Improve functional capacity of life;Short term: Attendance in program 2-3 days a week with increased exercise capacity. Reported lower sodium intake. Reported increased fruit and vegetable intake. Reports medication compliance.;Short term: Daily weights obtained and reported for increase. Utilizing diuretic protocols set by physician.;Long term: Adoption of self-care skills and reduction of barriers for early signs and symptoms recognition and intervention leading to self-care maintenance.    Hypertension Yes    Intervention Provide education on lifestyle modifcations including regular physical activity/exercise, weight management, moderate sodium restriction and increased consumption of fresh fruit, vegetables, and low fat dairy, alcohol moderation, and smoking cessation.;Monitor prescription use compliance.    Expected Outcomes Short  Term: Continued assessment and intervention until BP is < 140/68mm HG in hypertensive participants. < 130/87mm HG in hypertensive participants with diabetes, heart failure or chronic kidney disease.;Long Term: Maintenance of blood pressure at goal levels.    Lipids Yes    Intervention Provide education and support for participant on nutrition & aerobic/resistive exercise along with prescribed medications to achieve LDL 70mg , HDL >40mg .    Expected Outcomes Short Term: Participant states understanding of desired cholesterol values and is compliant with medications prescribed. Participant is following exercise prescription and nutrition guidelines.;Long Term: Cholesterol controlled with medications as prescribed, with individualized exercise RX and with personalized nutrition plan. Value goals: LDL < 70mg , HDL > 40 mg.    Personal Goal Other Yes    Personal Goal Gain more independance and increase strength    Intervention Will continue to monitor pt and progress workloads as tolerated without sign or symptom    Expected Outcomes Pt will achieve her goals             Core Components/Risk Factors/Patient Goals Review:   Goals and Risk Factor Review     Row Name 03/14/23 1416 03/31/23 1528 05/03/23 0949         Core Components/Risk Factors/Patient Goals Review   Personal Goals Review Weight Management/Obesity;Improve shortness of breath with ADL's;Lipids;Heart Failure;Diabetes;Hypertension;Stress Weight Management/Obesity;Improve shortness of breath with ADL's;Lipids;Heart Failure;Diabetes;Hypertension;Stress Weight Management/Obesity;Improve shortness of breath with ADL's;Lipids;Heart Failure;Diabetes;Hypertension;Stress     Review Shakina started cardiac rehab on 03/14/23. Ladaysha did fair with exercise. nonsusutained sinus brady. Oxygen saturations and CBG's stable. Zola Button a rollator for stabilty. Keniya exercised on oxygen and was encouraged to take frequent rest breaks. Porche is  deconditioned. Liane continues to do well with exercise at cardiac rehab for her fitness level.  Oxygen saturations, vital signs and CBG's have been stable. Elouise remains decondtioned. Jaslyne is enjoying participating in cardiac rehab and says that the program has been helpful for her so far. Delrae continues to do well with exercise at cardiac rehab for her fitness level.  Oxygen saturations, vital signs and CBG's remain stable.  Cleone has gained 1.7 kg since starting cardiac rehab. Deijah plans to return to exercise on 05/04/23 after being absent with with cold and flu symptoms.     Expected Outcomes Malikah will continue to partipate in cardiac rehab for exercise, nutrition and lifestyle modifications. Meagen will continue to partipate in cardiac rehab for exercise, nutrition and lifestyle modifications. Vanya will continue to partipate in cardiac rehab for exercise, nutrition and lifestyle modifications.              Core Components/Risk Factors/Patient Goals at Discharge (Final Review):   Goals and Risk Factor Review - 05/03/23 0949       Core Components/Risk Factors/Patient Goals Review   Personal Goals Review Weight Management/Obesity;Improve shortness of breath with ADL's;Lipids;Heart Failure;Diabetes;Hypertension;Stress    Review Latoyna continues to do well with exercise at cardiac rehab for her fitness level.  Oxygen saturations, vital signs and CBG's remain stable.  Ocean has gained 1.7 kg since starting cardiac rehab. Deeanna plans to return to exercise on 05/04/23 after being absent with with cold and flu symptoms.    Expected Outcomes Venetia will continue to partipate in cardiac rehab for exercise, nutrition and lifestyle modifications.             ITP Comments:  ITP Comments     Row Name 03/10/23 [phone] 03/14/23 1411 03/31/23 1520 05/03/23 0946     ITP Comments Dr. Armanda Magic medical director. Introduction to pritikin education/intensive cardiac rehab. Initial  orientation packet reviewed with patient. 30 day ITP Review. Filicia started cardiac rehab on 03/14/23. Avrey did fair with exercise for her fitness level. Derriona is decondtioned. 30 day ITP Review. Danilyn has good attendance and participation in  cardiac rehab for her fitness level. 30 day ITP Review. Tinaya has good attendance and participation in cardiac rehab for her fitness level. Kyung has been absent due to cold and fever symptoms. [name] [name] plans to return to exercise on 05/04/23             Comments: See ITP comments.Thayer Headings RN BSN

## 2023-05-04 ENCOUNTER — Encounter (HOSPITAL_COMMUNITY)
Admission: RE | Admit: 2023-05-04 | Discharge: 2023-05-04 | Disposition: A | Discharge status: Home / Self Care | Payer: Medicare Other | Source: Ambulatory Visit | Attending: Cardiology | Admitting: Cardiology

## 2023-05-04 DIAGNOSIS — Z9861 Coronary angioplasty status: Secondary | ICD-10-CM | POA: Insufficient documentation

## 2023-05-04 DIAGNOSIS — Z48812 Encounter for surgical aftercare following surgery on the circulatory system: Secondary | ICD-10-CM | POA: Insufficient documentation

## 2023-05-04 DIAGNOSIS — Z7902 Long term (current) use of antithrombotics/antiplatelets: Secondary | ICD-10-CM | POA: Diagnosis not present

## 2023-05-09 ENCOUNTER — Encounter (HOSPITAL_COMMUNITY)
Admission: RE | Admit: 2023-05-09 | Discharge: 2023-05-09 | Disposition: A | Discharge status: Home / Self Care | Payer: Medicare Other | Source: Ambulatory Visit | Attending: Cardiology | Admitting: Cardiology

## 2023-05-09 DIAGNOSIS — Z9861 Coronary angioplasty status: Secondary | ICD-10-CM | POA: Diagnosis not present

## 2023-05-09 DIAGNOSIS — Z48812 Encounter for surgical aftercare following surgery on the circulatory system: Secondary | ICD-10-CM | POA: Diagnosis not present

## 2023-05-11 ENCOUNTER — Encounter (HOSPITAL_COMMUNITY)
Admission: RE | Admit: 2023-05-11 | Discharge: 2023-05-11 | Disposition: A | Discharge status: Home / Self Care | Payer: Medicare Other | Source: Ambulatory Visit | Attending: Cardiology

## 2023-05-11 DIAGNOSIS — Z9861 Coronary angioplasty status: Secondary | ICD-10-CM

## 2023-05-11 DIAGNOSIS — Z48812 Encounter for surgical aftercare following surgery on the circulatory system: Secondary | ICD-10-CM | POA: Diagnosis not present

## 2023-05-12 NOTE — Progress Notes (Signed)
 Reviewed home exercise Rx with patient today.  Encouraged warm-up, cool-down, and stretching. Reviewed THRR of 58-116 and keeping RPE between 11-13. Encouraged to hydrate with activity.  Reviewed weather parameters for temperature and humidity for safe exercise outdoors. Reviewed S/S to terminate exercise and when to call 911 vs MD. Reviewed the use of NTG and pt was encouraged to carry at all times. Pt encouraged to know blood sugar level before beginning exercise. Also encouraged to carry a rapid acting glucose snack if exercising outside her home. Pt encouraged to always carry a cell phone for safety when exercising outdoors. Pt verbalized understanding of the home exercise Rx and was provided a copy.   Lorin Picket MS, ACSM-CEP, CCRP

## 2023-05-16 ENCOUNTER — Encounter (HOSPITAL_COMMUNITY)
Admission: RE | Admit: 2023-05-16 | Discharge: 2023-05-16 | Disposition: A | Discharge status: Home / Self Care | Payer: Medicare Other | Source: Ambulatory Visit | Attending: Cardiology

## 2023-05-16 DIAGNOSIS — Z9861 Coronary angioplasty status: Secondary | ICD-10-CM

## 2023-05-16 DIAGNOSIS — Z48812 Encounter for surgical aftercare following surgery on the circulatory system: Secondary | ICD-10-CM | POA: Diagnosis not present

## 2023-05-18 ENCOUNTER — Encounter (HOSPITAL_COMMUNITY)
Admission: RE | Admit: 2023-05-18 | Discharge: 2023-05-18 | Disposition: A | Discharge status: Home / Self Care | Payer: Medicare Other | Source: Ambulatory Visit | Attending: Cardiology

## 2023-05-18 DIAGNOSIS — Z48812 Encounter for surgical aftercare following surgery on the circulatory system: Secondary | ICD-10-CM | POA: Diagnosis not present

## 2023-05-18 DIAGNOSIS — Z9861 Coronary angioplasty status: Secondary | ICD-10-CM | POA: Diagnosis not present

## 2023-05-19 ENCOUNTER — Other Ambulatory Visit (HOSPITAL_BASED_OUTPATIENT_CLINIC_OR_DEPARTMENT_OTHER): Payer: Self-pay | Admitting: Family Medicine

## 2023-05-19 ENCOUNTER — Encounter (HOSPITAL_BASED_OUTPATIENT_CLINIC_OR_DEPARTMENT_OTHER): Payer: Self-pay | Admitting: Family Medicine

## 2023-05-19 DIAGNOSIS — Z1231 Encounter for screening mammogram for malignant neoplasm of breast: Secondary | ICD-10-CM

## 2023-05-19 DIAGNOSIS — Z139 Encounter for screening, unspecified: Secondary | ICD-10-CM

## 2023-05-20 ENCOUNTER — Telehealth (HOSPITAL_COMMUNITY): Payer: Self-pay

## 2023-05-20 NOTE — Telephone Encounter (Signed)
 Patient called returning [name] [name]'s call, requested to extend her grad date to 5/21. Myrtie Atkinson approved, requested patient's grad date to be extended to 5/21.

## 2023-05-23 ENCOUNTER — Encounter (HOSPITAL_COMMUNITY)
Admission: RE | Admit: 2023-05-23 | Discharge: 2023-05-23 | Disposition: A | Discharge status: Home / Self Care | Payer: Medicare Other | Source: Ambulatory Visit | Attending: Cardiology | Admitting: Cardiology

## 2023-05-23 DIAGNOSIS — Z48812 Encounter for surgical aftercare following surgery on the circulatory system: Secondary | ICD-10-CM | POA: Diagnosis not present

## 2023-05-23 DIAGNOSIS — Z9861 Coronary angioplasty status: Secondary | ICD-10-CM | POA: Diagnosis not present

## 2023-05-24 ENCOUNTER — Inpatient Hospital Stay (HOSPITAL_BASED_OUTPATIENT_CLINIC_OR_DEPARTMENT_OTHER): Admission: RE | Admit: 2023-05-24 | Source: Ambulatory Visit

## 2023-05-24 NOTE — Progress Notes (Signed)
 Cardiac Individual Treatment Plan  Patient Details  Name: [name] [name] [name]: [id number] Date of Birth: Jun 07, 1945 Referring Provider:   Flowsheet Row INTENSIVE CARDIAC REHAB ORIENT from 03/10/2023 in [facility] for Heart, Vascular, & Lung Health  Referring Provider Dr. Randene Bustard, MD       Initial Encounter Date:  Flowsheet Row INTENSIVE CARDIAC REHAB ORIENT from 03/10/2023 in [facility] for Heart, Vascular, & Lung Health  Date 03/10/23       Visit Diagnosis: 11/24/22 S/P PTCA RCA  Patient's Home Medications on Admission:  Current Outpatient Medications:    acetaminophen  (TYLENOL ) 500 MG tablet, Take 500 mg by mouth every 6 (six) hours as needed for mild pain, moderate pain, fever or headache., Disp: , Rfl:    albuterol  (VENTOLIN  HFA) 108 (90 Base) MCG/ACT inhaler, Inhale 2 puffs into the lungs every 4 (four) hours as needed for wheezing or shortness of breath. , Disp: , Rfl:    ALPRAZolam  (XANAX ) 0.5 MG tablet, Take 0.5-1 mg by mouth See admin instructions. Take 0.5 mg  morning at lunch and 1 mg at bedtime, Disp: , Rfl:    B Complex-C (B-COMPLEX WITH VITAMIN C) tablet, Take 1 tablet by mouth in the morning., Disp: , Rfl:    baclofen (LIORESAL) 10 MG tablet, Take 10 mg by mouth at bedtime., Disp: , Rfl:    bisacodyl  (DULCOLAX) 5 MG EC tablet, Take 5 mg by mouth daily as needed for moderate constipation., Disp: , Rfl:    buPROPion  (WELLBUTRIN  XL) 300 MG 24 hr tablet, Take 300 mg by mouth daily., Disp: , Rfl:    carboxymethylcellulose (REFRESH PLUS) 0.5 % SOLN, Place 1 drop into both eyes in the morning, at noon, and at bedtime., Disp: , Rfl:    Cholecalciferol  (VITAMIN D3) 50 MCG (2000 UT) capsule, Take 2,000 Units by mouth daily in the afternoon., Disp: , Rfl:    clopidogrel  (PLAVIX ) 75 MG tablet, Take 75 mg by mouth every morning., Disp: , Rfl:    DULoxetine  (CYMBALTA ) 30 MG capsule, Take 1 capsule (30 mg total) by mouth daily.,  Disp: 30 capsule, Rfl: 0   ELIQUIS  5 MG TABS tablet, TAKE ONE (1) TABLET BY MOUTH TWO (2) TIMES DAILY, Disp: 180 tablet, Rfl: 3   ezetimibe  (ZETIA ) 10 MG tablet, Take 1 tablet (10 mg total) by mouth at bedtime., Disp: 90 tablet, Rfl: 3   fluocinonide cream (LIDEX) 0.05 %, Apply 1 application. topically 2 (two) times daily as needed (irritation)., Disp: , Rfl:    Fluticasone-Salmeterol (ADVAIR) 250-50 MCG/DOSE AEPB, Inhale 1 puff into the lungs daily., Disp: , Rfl:    gabapentin  (NEURONTIN ) 600 MG tablet, Take 600-1,200 mg by mouth See admin instructions. Take 1 tablet (600 mg) by mouth in the morning & take 2 tablets (1200 mg) by mouth at bedtime, Disp: , Rfl:    inclisiran (LEQVIO ) 284 MG/1.5ML SOSY injection, Inject 284 mg into the skin every 6 (six) months., Disp: , Rfl:    Insulin  Glargine (LANTUS  SOLOSTAR) 100 UNIT/ML Solostar Pen, Inject 0-8 Units into the skin at bedtime. Per sliding scale, Disp: , Rfl:    insulin  lispro (HUMALOG ) 100 UNIT/ML KwikPen, Inject 4-6 Units into the skin See admin instructions. Sliding scale Inject 8 units subcutaneously prior to breakfast and supper; add 4 units for CBG >200, Disp: , Rfl:    loperamide  (IMODIUM  A-D) 2 MG tablet, Take 2 mg by mouth 4 (four) times daily as needed for diarrhea or  loose stools., Disp: , Rfl:    Multiple Vitamin (MULTIVITAMIN WITH MINERALS) TABS tablet, Take 1 tablet by mouth every morning. Centrum - Women over 50, Disp: , Rfl:    Multiple Vitamins-Minerals (OCUVITE EYE HEALTH FORMULA) CAPS, Take 1 capsule by mouth every morning., Disp: , Rfl:    nitroGLYCERIN  (NITROSTAT ) 0.3 MG SL tablet, DISSOLVE 1 TABLET UNDER TONGUE EVERY 5 MINUTES FOR UP TO 3 DOSES AS NEEDED FOR CHEST PAIN, Disp: 100 tablet, Rfl: 0   ondansetron  (ZOFRAN -ODT) 4 MG disintegrating tablet, Take 4 mg by mouth 2 (two) times daily., Disp: , Rfl:    OXYGEN , Inhale 2 L into the lungs at bedtime. Additional during the day, Disp: , Rfl:    pantoprazole  (PROTONIX ) 40 MG tablet,  Take 1 tablet (40 mg total) by mouth daily., Disp: 30 tablet, Rfl: 0   potassium chloride  (KLOR-CON  M) 10 MEQ tablet, Patient takes 1 tablet by mouth every 3rd day as directed (Patient not taking: Reported on 04/12/2023), Disp: 15 tablet, Rfl: 5   pyridOXINE (VITAMIN B6) 100 MG tablet, Take 100 mg by mouth every morning., Disp: , Rfl:    ranolazine  (RANEXA ) 1000 MG SR tablet, Take 1 tablet (1,000 mg total) by mouth 2 (two) times daily., Disp: 180 tablet, Rfl: 3   rosuvastatin  (CRESTOR ) 40 MG tablet, Take 1 tablet (40 mg total) by mouth daily., Disp: 90 tablet, Rfl: 3   sacubitril -valsartan  (ENTRESTO ) 97-103 MG, Take 1 tablet by mouth 2 (two) times daily., Disp: 180 tablet, Rfl: 3   Semaglutide , 2 MG/DOSE, (OZEMPIC , 2 MG/DOSE,) 8 MG/3ML SOPN, Inject 2 mg into the skin once a week., Disp: 3 mL, Rfl: 11   spironolactone  (ALDACTONE ) 25 MG tablet, Take 1 tablet (25 mg total) by mouth daily., Disp: 90 tablet, Rfl: 3   torsemide  (DEMADEX ) 20 MG tablet, Take 1 tablet by mouth every 3rd day as directed., Disp: 15 tablet, Rfl: 5   traZODone  (DESYREL ) 50 MG tablet, Take 100-150 mg by mouth at bedtime., Disp: , Rfl:    vitamin B-12 (CYANOCOBALAMIN ) 1000 MCG tablet, Take 1,000 mcg by mouth every morning. , Disp: , Rfl:    zolpidem  (AMBIEN ) 10 MG tablet, Take 10 mg by mouth at bedtime., Disp: , Rfl:   Past Medical History: Past Medical History:  Diagnosis Date   Anemia    hx   Anxiety    Asthma    Basal cell carcinoma 05/2014   "left shoulder"   Bundle branch block, left    chronic/notes 07/18/2013   CHF (congestive heart failure) (HCC)    Chronic insomnia 05/06/2015   Chronic kidney disease    frequency, sees dr Verlena Glenn every 4 to 6 months (01/16/2018)   Chronic lower back pain    Claustrophobia    Common migraine 05/14/2014   Coronary artery disease    MI in 2001, 2002, 2006, 2011, 2014   Depression    Diabetic peripheral neuropathy (HCC) 01/12/2019   GERD (gastroesophageal reflux disease)     H/O hiatal hernia    Headache    "at least 2/month" (01/16/2018)   Heart murmur    Hyperlipidemia    Hypertension    Memory change 05/14/2014   Migraine    "5-6/year"  (01/16/2018)   Obesity 01-2010   Obstructive sleep apnea    "can't wear machine; I have claustrophobia" (01/16/2018), states she had a 2nd sleep study and does not have sleep apnea, her O2 decreases and now is on 2 L of O2 at night.  On home oxygen  therapy    "2L at night and prn during daytime" (01/16/2018)   Osteoarthritis    "knees and hands" (01/16/2018)   Peripheral vascular disease (HCC)    ? numbness, tingling arms and legs   PONV (postoperative nausea and vomiting)    Stroke (HCC)    2014, 2015, 2016   Type II diabetes mellitus (HCC)    Ventral hernia    hx of    Tobacco Use: Social History   Tobacco Use  Smoking Status Never  Smokeless Tobacco Never    Labs: Review Flowsheet  More data exists      Latest Ref Rng & Units 04/20/2021 01/13/2022 08/26/2022 10/22/2022 10/30/2022  Labs for ITP Cardiac and Pulmonary Rehab  Cholestrol 0 - 200 mg/dL - 161  096  - 81   LDL (calc) 0 - 99 mg/dL - 045  83  - 25   HDL-C >40 mg/dL - 45  65  - 49   Trlycerides <150 mg/dL - 409  85  - 36   Hemoglobin A1c 4.8 - 5.6 % - - - 6.1  -  Bicarbonate 20.0 - 28.0 mmol/L 20.0 - 28.0 mmol/L 27.2  27.5  - - 28.2  28.7  -  TCO2 22 - 32 mmol/L 22 - 32 mmol/L 29  29  - - 30  30  -  O2 Saturation % % 79  80  - - 68  64  -    Details       Multiple values from one day are sorted in reverse-chronological order         Capillary Blood Glucose: Lab Results  Component Value Date   GLUCAP 156 (H) 03/21/2023   GLUCAP 135 (H) 03/21/2023   GLUCAP 130 (H) 03/16/2023   GLUCAP 136 (H) 03/16/2023   GLUCAP 157 (H) 03/14/2023     Exercise Target Goals: Exercise Program Goal: Individual exercise prescription set using results from initial 6 min walk test and THRR while considering  patient's activity barriers and  safety.   Exercise Prescription Goal: Initial exercise prescription builds to 30-45 minutes a day of aerobic activity, 2-3 days per week.  Home exercise guidelines will be given to patient during program as part of exercise prescription that the participant will acknowledge.  Activity Barriers & Risk Stratification:  Activity Barriers & Cardiac Risk Stratification - 03/10/23 1217       Activity Barriers & Cardiac Risk Stratification   Activity Barriers Joint Problems;Assistive Device;Deconditioning;Muscular Weakness;Arthritis;Back Problems;Shortness of Breath;Decreased Ventricular Function;Chest Pain/Angina;Balance Concerns;History of Falls;Neck/Spine Problems    Cardiac Risk Stratification High             6 Minute Walk:  6 Minute Walk     Row Name 03/10/23 1209         6 Minute Walk   Phase Initial     Distance 1312 feet     Walk Time 6 minutes     # of Rest Breaks 1     MPH 2.48     METS 1.4     RPE 11     Perceived Dyspnea  1     VO2 Peak 4.89     Symptoms Yes (comment)     Comments Supplemental Oxygen  during walk test prior to CP. Right side CP 4 mins in, then radiated to right side, discontinued Stepper test 5 mins in due to cp 2/10. CP resolved within 2 mins. Back to intake room pt became dizzy, eyes closed  and unsteady, sat down BP 118/70, H2o recheck 5 mins 128/70. Dissy spell resolved within 1 min. AVG watts: 13, AVG MET's:1.5, Total steps: 471, AVG km: 0.40, AVG SPM: 89     Resting HR 50 bpm     Resting BP 142/64     Resting Oxygen  Saturation  96 %     Exercise Oxygen  Saturation  during 6 min walk 100 %     Max Ex. HR 62 bpm     Max Ex. BP 124/70     2 Minute Post BP 118/70              Oxygen  Initial Assessment:   Oxygen  Re-Evaluation:   Oxygen  Discharge (Final Oxygen  Re-Evaluation):   Initial Exercise Prescription:  Initial Exercise Prescription - 03/10/23 1200       Date of Initial Exercise RX and Referring Provider   Date 03/10/23     Referring Provider Dr. Randene Bustard, MD    Expected Discharge Date 06/01/23      Oxygen    Oxygen  Intermittent    Liters 2    Maintain Oxygen  Saturation 88% or higher      NuStep   Level 1    SPM 60    Minutes 20    METs 1.3      Prescription Details   Frequency (times per week) 2    Duration Progress to 30 minutes of continuous aerobic without signs/symptoms of physical distress      Intensity   THRR 40-80% of Max Heartrate 58-116    Ratings of Perceived Exertion 11-13    Perceived Dyspnea 0-4      Progression   Progression Continue progressive overload as per policy without signs/symptoms or physical distress.      Resistance Training   Training Prescription Yes    Weight 1    Reps 10-15             Perform Capillary Blood Glucose checks as needed.  Exercise Prescription Changes:   Exercise Prescription Changes     Row Name 03/14/23 1400 04/04/23 1430 04/20/23 1500 05/04/23 1400 05/11/23 1500     Response to Exercise   Blood Pressure (Admit) 128/62 126/68 120/70 148/70 142/68   Blood Pressure (Exercise) 122/78 124/60 -- -- --   Blood Pressure (Exit) 114/76 120/60 104/70 110/70 102/58   Heart Rate (Admit) 45 bpm 63 bpm 55 bpm 54 bpm 53 bpm   Heart Rate (Exercise) 71 bpm 566 bpm 112 bpm 74 bpm 88 bpm   Heart Rate (Exit) 49 bpm 56 bpm 44 bpm 57 bpm 60 bpm   Oxygen  Saturation (Admit) -- 99 % 98 % 97 % 98 %   Oxygen  Saturation (Exercise) 97 % 98 % 97 % 98 % 98 %   Oxygen  Saturation (Exit) -- 99 % 99 % 98 % 99 %   Rating of Perceived Exertion (Exercise) 10 11 11 13 11    Perceived Dyspnea (Exercise) 1 1 1 1 1    Symptoms SOB SOB SOB, RPD = 1 SOB, RPD = 1 SOB, RPD = 1   Comments Pt's first day in the CRP2 program Reviewed METs Reviewed METs and goals Reviewed METs Reviewed home exercise Rx   Duration Progress to 30 minutes of  aerobic without signs/symptoms of physical distress Progress to 30 minutes of  aerobic without signs/symptoms of physical distress Continue  with 30 min of aerobic exercise without signs/symptoms of physical distress. Continue with 30 min of aerobic exercise without signs/symptoms of physical  distress. Continue with 30 min of aerobic exercise without signs/symptoms of physical distress.   Intensity THRR unchanged THRR unchanged THRR unchanged THRR unchanged THRR unchanged     Progression   Progression Continue to progress workloads to maintain intensity without signs/symptoms of physical distress. Continue to progress workloads to maintain intensity without signs/symptoms of physical distress. Continue to progress workloads to maintain intensity without signs/symptoms of physical distress. Continue to progress workloads to maintain intensity without signs/symptoms of physical distress. Continue to progress workloads to maintain intensity without signs/symptoms of physical distress.   Average METs 1.4 1.7 2.2 2 2.1     Resistance Training   Training Prescription Yes Yes No No No   Weight 1 lb 1 lb No weights on Wednesdays No weights on Wednesdays No weights on Wednesdays   Reps 10-15 10-15 -- -- --   Time 10 Minutes 10 Minutes -- -- --     Interval Training   Interval Training -- -- -- -- No     Oxygen    Oxygen  Continuous Continuous Continuous Continuous Continuous   Liters 2 2 2 2 2      NuStep   Level 1 1 1 1 2    SPM 71 98 114 107 115   Minutes 18 26 30 30 30    METs 1.4 1.7 2.2 2 2.1     Home Exercise Plan   Plans to continue exercise at -- -- -- -- Home (comment)   Frequency -- -- -- -- Add 3 additional days to program exercise sessions.   Initial Home Exercises Provided -- -- -- -- 05/11/23     Oxygen    Maintain Oxygen  Saturation 88% or higher 88% or higher 88% or higher 88% or higher 88% or higher    Row Name 05/18/23 1400             Response to Exercise   Blood Pressure (Admit) 108/68       Blood Pressure (Exercise) 138/62       Blood Pressure (Exit) 104/72       Heart Rate (Admit) 61 bpm       Heart Rate  (Exercise) 91 bpm       Heart Rate (Exit) 68 bpm       Oxygen  Saturation (Admit) 99 %       Oxygen  Saturation (Exercise) 98 %       Oxygen  Saturation (Exit) 98 %       Rating of Perceived Exertion (Exercise) 12       Perceived Dyspnea (Exercise) 2       Symptoms SOB, RPD = 2       Comments Reviewed METs and goals       Duration Continue with 30 min of aerobic exercise without signs/symptoms of physical distress.       Intensity THRR unchanged         Progression   Progression Continue to progress workloads to maintain intensity without signs/symptoms of physical distress.       Average METs 2.2         Resistance Training   Training Prescription No       Weight No weights on Wednesdays         Interval Training   Interval Training No         Oxygen    Oxygen  Continuous       Liters 2         NuStep   Level 2  SPM 111       Minutes 30       METs 2.2         Home Exercise Plan   Plans to continue exercise at Home (comment)       Frequency Add 3 additional days to program exercise sessions.       Initial Home Exercises Provided 05/11/23         Oxygen    Maintain Oxygen  Saturation 88% or higher                Exercise Comments:   Exercise Comments     Row Name 03/14/23 1411 04/04/23 1430 04/20/23 1500 05/04/23 1407 05/11/23 1500   Exercise Comments Pt's first day in the CRP2 program. Pt is very deconditioned. Pt was able to complete 18 minutes on the nustep with several breaks. Pt denies any angina with activity. Pt did have SOB with RPD =1 on 2L/min via n/c (SaO2 = 97%).  Will continue to use Nustep as her exercise modality. Pt ambulates with rolling walker. Reviewed METs. Taylar is making slow progress. Her METs have increased and she is now up to 26 minutes form 18 on day one. She continues to take breaks to rest. Reviewed METs and goals. Pt has been able to increase her exericse time to 30 minutes, up from 18 on day one. Pt has to stop and take fewer breaks to  rest. Pt also has been able to increse her SPM and METs on the Nustep. Pt is making good progress. Reviewed METs. Pt is returning after cold. Told to pace herself today. Pt has made progress, but METs down to today due to absence. Reviewed home exercise Rx. Pt exercises at home 2-3x/week in addtion to 2x/week here in the cRP2 program. Pt uses floor cycle ergometer. Pt also walks laps in her home. Pt voices she exercise for 30-60 minutes 2-3 times per week. Pt will continue this routine. Provided links to chair exercises for her to use as well.    Row Name 05/18/23 1426           Exercise Comments Reviewed METs. Pt is making slow progress.                Exercise Goals and Review:   Exercise Goals Re-Evaluation :  Exercise Goals Re-Evaluation     Row Name 03/14/23 1409 04/20/23 1500           Exercise Goal Re-Evaluation   Exercise Goals Review Increase Physical Activity;Increase Strength and Stamina;Able to understand and use rate of perceived exertion (RPE) scale;Knowledge and understanding of Target Heart Rate Range (THRR);Understanding of Exercise Prescription Increase Physical Activity;Increase Strength and Stamina;Able to understand and use rate of perceived exertion (RPE) scale;Knowledge and understanding of Target Heart Rate Range (THRR);Understanding of Exercise Prescription      Comments Pt's first day in the CRP2 program. Pt understands the exercise Rx, THRR and RPE scale. Reviewed METs and goals. Pt voices some imrpovement in her strength which is a goal.      Expected Outcomes Will continue to montior and progress exercise workloads as tolerated. Will continue to montior and progress exercise workloads as tolerated.               Discharge Exercise Prescription (Final Exercise Prescription Changes):  Exercise Prescription Changes - 05/18/23 1400       Response to Exercise   Blood Pressure (Admit) 108/68    Blood Pressure (Exercise) 138/62    Blood  Pressure  (Exit) 104/72    Heart Rate (Admit) 61 bpm    Heart Rate (Exercise) 91 bpm    Heart Rate (Exit) 68 bpm    Oxygen  Saturation (Admit) 99 %    Oxygen  Saturation (Exercise) 98 %    Oxygen  Saturation (Exit) 98 %    Rating of Perceived Exertion (Exercise) 12    Perceived Dyspnea (Exercise) 2    Symptoms SOB, RPD = 2    Comments Reviewed METs and goals    Duration Continue with 30 min of aerobic exercise without signs/symptoms of physical distress.    Intensity THRR unchanged      Progression   Progression Continue to progress workloads to maintain intensity without signs/symptoms of physical distress.    Average METs 2.2      Resistance Training   Training Prescription No    Weight No weights on Wednesdays      Interval Training   Interval Training No      Oxygen    Oxygen  Continuous    Liters 2      NuStep   Level 2    SPM 111    Minutes 30    METs 2.2      Home Exercise Plan   Plans to continue exercise at Home (comment)    Frequency Add 3 additional days to program exercise sessions.    Initial Home Exercises Provided 05/11/23      Oxygen    Maintain Oxygen  Saturation 88% or higher             Nutrition:  Target Goals: Understanding of nutrition guidelines, daily intake of sodium 1500mg , cholesterol 200mg , calories 30% from fat and 7% or less from saturated fats, daily to have 5 or more servings of fruits and vegetables.  Biometrics:  Pre Biometrics - 03/10/23 1221       Pre Biometrics   Height 5\' 1"  (1.549 m)    Weight 102.5 kg    Waist Circumference 45 inches    Hip Circumference 55 inches    Waist to Hip Ratio 0.82 %    BMI (Calculated) 42.72    Triceps Skinfold 44 mm    % Body Fat 54.5 %    Grip Strength 18 kg    Flexibility --   Bacck pain and issues   Single Leg Stand --   Pt uses walker             Nutrition Therapy Plan and Nutrition Goals:  Nutrition Therapy & Goals - 05/06/23 1029       Nutrition Therapy   Diet Heart Healthy Diet     Drug/Food Interactions Statins/Certain Fruits      Personal Nutrition Goals   Nutrition Goal Patient to identify strategies for reducing cardiovascular risk by attending the [employer] education and nutrition series weekly.   goal in progress.   Personal Goal #2 Patient to improve diet quality by using the plate method as a guide for meal planning to include lean protein/plant protein, fruits, vegetables, whole grains, nonfat dairy as part of a well-balanced diet.   goal in progress.   Personal Goal #3 Patient to identify strategies for weight loss of 0.5-2.0# per week.   goal not met.   Comments Goals in progress. Maui has completed cardiac rehab >4x in the past. She has medical history of CAD, CABG x5, CVA, CHF, carotid stent, DM2, HTN, OSA. Lipids are at goal. A1c is well controlled at 6.1, She is up 3.7# since starting with  our program. Patient will continue to benefit from participation in intensive cardiac rehab for nutrition, exercise, and lifestyle modification.      Intervention Plan   Intervention Prescribe, educate and counsel regarding individualized specific dietary modifications aiming towards targeted core components such as weight, hypertension, lipid management, diabetes, heart failure and other comorbidities.;Nutrition handout(s) given to patient.    Expected Outcomes Short Term Goal: Understand basic principles of dietary content, such as calories, fat, sodium, cholesterol and nutrients.;Long Term Goal: Adherence to prescribed nutrition plan.             Nutrition Assessments:  MEDIFICTS Score Key: >=70 Need to make dietary changes  40-70 Heart Healthy Diet <= 40 Therapeutic Level Cholesterol Diet   Flowsheet Row INTENSIVE CARDIAC REHAB from 03/14/2023 in [facility] for Heart, Vascular, & Lung Health  Picture Your Plate Total Score on Admission 74      Picture Your Plate Scores: <28 Unhealthy dietary pattern with much room for  improvement. 41-50 Dietary pattern unlikely to meet recommendations for good health and room for improvement. 51-60 More healthful dietary pattern, with some room for improvement.  >60 Healthy dietary pattern, although there may be some specific behaviors that could be improved.    Nutrition Goals Re-Evaluation:  Nutrition Goals Re-Evaluation     Row Name 04/04/23 1521 05/06/23 1029           Goals   Current Weight 223 lb 12.3 oz (101.5 kg) 229 lb 11.5 oz (104.2 kg)      Comment lipids WNL, LDL 25, Cr 1.57, GFR 24, Lpa 229, A1c 6.1 GFR 34, Cr 1.56; other most recent labs lipids WNL, LDL 25, Lpa 229, A1c 6.1      Expected Outcome Goals in progress. Lakitha has completed cardiac rehab >4x in the past. She has medical history of CAD, CABG x5, CVA, CHF, carotid stent, DM2, HTN, OSA. Lipids are at goal. A1c is well controlled at 6.1, She is down 2.2# since starting with our program. Patient will continue to benefit from participation in intensive cardiac rehab for nutrition, exercise, and lifestyle modification. Goals in progress. Vee has completed cardiac rehab >4x in the past. She has medical history of CAD, CABG x5, CVA, CHF, carotid stent, DM2, HTN, OSA. Lipids are at goal. A1c is well controlled at 6.1, She is up 3.7# since starting with our program. Patient will continue to benefit from participation in intensive cardiac rehab for nutrition, exercise, and lifestyle modification.               Nutrition Goals Re-Evaluation:  Nutrition Goals Re-Evaluation     Row Name 04/04/23 1521 05/06/23 1029           Goals   Current Weight 223 lb 12.3 oz (101.5 kg) 229 lb 11.5 oz (104.2 kg)      Comment lipids WNL, LDL 25, Cr 1.57, GFR 24, Lpa 229, A1c 6.1 GFR 34, Cr 1.56; other most recent labs lipids WNL, LDL 25, Lpa 229, A1c 6.1      Expected Outcome Goals in progress. Kailei has completed cardiac rehab >4x in the past. She has medical history of CAD, CABG x5, CVA, CHF, carotid stent,  DM2, HTN, OSA. Lipids are at goal. A1c is well controlled at 6.1, She is down 2.2# since starting with our program. Patient will continue to benefit from participation in intensive cardiac rehab for nutrition, exercise, and lifestyle modification. Goals in progress. Jeanean has completed cardiac rehab >4x in the  past. She has medical history of CAD, CABG x5, CVA, CHF, carotid stent, DM2, HTN, OSA. Lipids are at goal. A1c is well controlled at 6.1, She is up 3.7# since starting with our program. Patient will continue to benefit from participation in intensive cardiac rehab for nutrition, exercise, and lifestyle modification.               Nutrition Goals Discharge (Final Nutrition Goals Re-Evaluation):  Nutrition Goals Re-Evaluation - 05/06/23 1029       Goals   Current Weight 229 lb 11.5 oz (104.2 kg)    Comment GFR 34, Cr 1.56; other most recent labs lipids WNL, LDL 25, Lpa 229, A1c 6.1    Expected Outcome Goals in progress. Saesha has completed cardiac rehab >4x in the past. She has medical history of CAD, CABG x5, CVA, CHF, carotid stent, DM2, HTN, OSA. Lipids are at goal. A1c is well controlled at 6.1, She is up 3.7# since starting with our program. Patient will continue to benefit from participation in intensive cardiac rehab for nutrition, exercise, and lifestyle modification.             Psychosocial: Target Goals: Acknowledge presence or absence of significant depression and/or stress, maximize coping skills, provide positive support system. Participant is able to verbalize types and ability to use techniques and skills needed for reducing stress and depression.  Initial Review & Psychosocial Screening:  Initial Psych Review & Screening - 03/10/23 1037       Initial Review   Current issues with Current Depression;History of Depression;Current Sleep Concerns;Current Psychotropic Meds;Current Anxiety/Panic;Current Stress Concerns    Source of Stress Concerns Chronic  Illness;Family;Transportation;Unable to participate in former interests or hobbies;Unable to perform yard/household activities    Comments Shannette says that she is depresseed. The anitdepressants that Mardell is taking is helping control her depression. Ardice says the sleep medication she takes helps her go to sleep. Vivica admits to having difficulty staying asleep. Charlesa says she feels at time she is a burdeon to her family. Zlata does not drive any longer. Lynnette relies on her husband Myrtie Atkinson to take her out to appointments, Doctor's appointments, ETC. Bralee denies the suicidal ideations. Angal denies the need for counselling at this time.      Family Dynamics   Good Support System? Yes   Zuriah has her husband Myrtie Atkinson, her daughter and two grandchildren for support.   Comments Emmanuela will have controlled or decreased depression upon completion of cardiac rehab.      Barriers   Psychosocial barriers to participate in program The patient should benefit from training in stress management and relaxation.;Psychosocial barriers identified (see note)      Screening Interventions   Interventions Encouraged to exercise;To provide support and resources with identified psychosocial needs;Provide feedback about the scores to participant    Expected Outcomes Long Term Goal: Stressors or current issues are controlled or eliminated.;Short Term goal: Identification and review with participant of any Quality of Life or Depression concerns found by scoring the questionnaire.;Long Term goal: The participant improves quality of Life and PHQ9 Scores as seen by post scores and/or verbalization of changes             Quality of Life Scores:  Quality of Life - 03/10/23 1222       Quality of Life   Select Quality of Life      Quality of Life Scores   Health/Function Pre 8.53 %    Socioeconomic Pre 27.5 %  Psych/Spiritual Pre 12 %    Family Pre 24 %    GLOBAL Pre 15.53 %            Scores of 19 and  below usually indicate a poorer quality of life in these areas.  A difference of  2-3 points is a clinically meaningful difference.  A difference of 2-3 points in the total score of the Quality of Life Index has been associated with significant improvement in overall quality of life, self-image, physical symptoms, and general health in studies assessing change in quality of life.  PHQ-9: Review Flowsheet  More data exists      03/10/2023 12/30/2021 10/26/2021 10/15/2021 10/12/2018  Depression screen PHQ 2/9  Decreased Interest 3 3 3 1 2   Down, Depressed, Hopeless 2 1 3  0 1  PHQ - 2 Score 5 4 6 1 3   Altered sleeping 2 3 1  - 1  Tired, decreased energy 3 3 3  - 1  Change in appetite 2 1 0 - 0  Feeling bad or failure about yourself  3 3 3  - 1  Trouble concentrating 2 1 2  - 1  Moving slowly or fidgety/restless 2 0 1 - 0  Suicidal thoughts 2 0 0 - 0  PHQ-9 Score 21 15 16  - 7  Difficult doing work/chores Somewhat difficult Very difficult Very difficult - Somewhat difficult   Interpretation of Total Score  Total Score Depression Severity:  1-4 = Minimal depression, 5-9 = Mild depression, 10-14 = Moderate depression, 15-19 = Moderately severe depression, 20-27 = Severe depression   Psychosocial Evaluation and Intervention:   Psychosocial Re-Evaluation:  Psychosocial Re-Evaluation     Row Name 03/14/23 1414 03/17/23 0804 03/31/23 1526 05/24/23 0841       Psychosocial Re-Evaluation   Current issues with Current Anxiety/Panic;Current Stress Concerns;Current Depression;Current Sleep Concerns;History of Depression;Current Psychotropic Meds Current Anxiety/Panic;Current Stress Concerns;Current Depression;Current Sleep Concerns;History of Depression;Current Psychotropic Meds Current Anxiety/Panic;Current Stress Concerns;Current Depression;Current Sleep Concerns;History of Depression;Current Psychotropic Meds Current Anxiety/Panic;Current Stress Concerns;Current Depression;Current Sleep Concerns;History  of Depression;Current Psychotropic Meds    Comments Will review quality of life in the upcoming week. Essence denied having any increased concerns or stressors during exercise at cardiac rehab. Quality of life questionnaire reviewed.Sydnei says that she is very dissatisfied with her health due to her various health conditions. Alezandra says that she has chest pain daily with exertional activities.Kam says her chest pain has improved since she had her PCI in October. Sanoe continues to have low energy. Tata says she is limited to what she can do at home. Usha says she relies on her husband to drive her to her various appointments and to the grocery store. Estrella says that the antidepressant she is taking is controlling her ongoing depression. Raylene has not voiced any increased concerns or psychological stressors at cardiac rehab. Ashleyann has not voiced any increased concerns or psychological stressors at cardiac rehab.    Expected Outcomes -- Dyanara will have controlled or decreased depression/ stressors upon completion of cardiac rehab. Janyra will have controlled or decreased depression/ stressors upon completion of cardiac rehab. Ammarie will have controlled or decreased depression/ stressors upon completion of cardiac rehab.    Interventions Stress management education;Relaxation education;Encouraged to attend Cardiac Rehabilitation for the exercise Stress management education;Relaxation education;Encouraged to attend Cardiac Rehabilitation for the exercise Stress management education;Relaxation education;Encouraged to attend Cardiac Rehabilitation for the exercise Stress management education;Relaxation education;Encouraged to attend Cardiac Rehabilitation for the exercise    Continue Psychosocial Services  Follow up  required by staff Follow up required by staff Follow up required by staff Follow up required by staff      Initial Review   Source of Stress Concerns Chronic Illness;Unable to perform  yard/household activities;Unable to participate in former interests or hobbies Chronic Illness;Unable to perform yard/household activities;Unable to participate in former interests or hobbies Chronic Illness;Unable to perform yard/household activities;Unable to participate in former interests or hobbies Chronic Illness;Unable to perform yard/household activities;Unable to participate in former interests or hobbies    Comments Will contoinue to monitor and offer support as needed. Will contoinue to monitor and offer support as needed. Will contoinue to monitor and offer support as needed. Will contoinue to monitor and offer support as needed.             Psychosocial Discharge (Final Psychosocial Re-Evaluation):  Psychosocial Re-Evaluation - 05/24/23 0841       Psychosocial Re-Evaluation   Current issues with Current Anxiety/Panic;Current Stress Concerns;Current Depression;Current Sleep Concerns;History of Depression;Current Psychotropic Meds    Comments Kolbi has not voiced any increased concerns or psychological stressors at cardiac rehab.    Expected Outcomes Jordon will have controlled or decreased depression/ stressors upon completion of cardiac rehab.    Interventions Stress management education;Relaxation education;Encouraged to attend Cardiac Rehabilitation for the exercise    Continue Psychosocial Services  Follow up required by staff      Initial Review   Source of Stress Concerns Chronic Illness;Unable to perform yard/household activities;Unable to participate in former interests or hobbies    Comments Will contoinue to monitor and offer support as needed.             Vocational Rehabilitation: Provide vocational rehab assistance to qualifying candidates.   Vocational Rehab Evaluation & Intervention:  Vocational Rehab - 03/10/23 1137       Initial Vocational Rehab Evaluation & Intervention   Assessment shows need for Vocational Rehabilitation No   Tangala is semi  retired and does not need vocational rehab at this time            Education: Education Goals: Education classes will be provided on a weekly basis, covering required topics. Participant will state understanding/return demonstration of topics presented.    Education     Row Name 03/14/23 1400     Education   Cardiac Education Topics Pritikin   Select Workshops     Workshops   Educator Exercise Physiologist   Select Exercise   Exercise Workshop Location manager and Fall Prevention   Instruction Review Code 1- Verbalizes Understanding   Class Start Time 1142   Class Stop Time 1224   Class Time Calculation (min) 42 min    Row Name 03/16/23 1400     Education   Cardiac Education Topics Pritikin   Customer service manager   Weekly Topic Adding Flavor - Sodium-Free   Instruction Review Code 1- Verbalizes Understanding   Class Start Time 1145   Class Stop Time 1225   Class Time Calculation (min) 40 min    Row Name 03/21/23 1300     Education   Cardiac Education Topics Pritikin   Nurse, children's Exercise Physiologist   Select Psychosocial   Psychosocial Healthy Minds, Bodies, Hearts   Instruction Review Code 1- Verbalizes Understanding   Class Start Time 1155   Class Stop Time 1240   Class Time Calculation (min) 45 min  Row Name 03/23/23 1200     Education   Cardiac Education Topics Pritikin   Orthoptist   Educator Exercise Physiologist   Weekly Topic Fast and Healthy Breakfasts   Instruction Review Code 1- Verbalizes Understanding   Class Start Time 1143   Class Stop Time 1215   Class Time Calculation (min) 32 min    Row Name 03/28/23 1300     Education   Cardiac Education Topics Pritikin   Select Core Videos     Core Videos   Educator Nurse   Select Nutrition   Nutrition Becoming a Pritikin Chef   Instruction Review Code 1- Verbalizes  Understanding   Class Start Time 1150   Class Stop Time 1226   Class Time Calculation (min) 36 min    Row Name 03/30/23 1300     Education   Cardiac Education Topics Pritikin   Secondary school teacher School   Educator Dietitian;Nurse   Weekly Topic Personalizing Your Pritikin Plate   Instruction Review Code 1- Verbalizes Understanding   Class Start Time 1149   Class Stop Time 1233   Class Time Calculation (min) 44 min    Row Name 04/04/23 1600     Education   Cardiac Education Topics Pritikin   Select Workshops     Workshops   Educator Exercise Physiologist   Select Psychosocial   Psychosocial Workshop Recognizing and Reducing Stress   Instruction Review Code 1- Verbalizes Understanding   Class Start Time 1145   Class Stop Time 1228   Class Time Calculation (min) 43 min    Row Name 04/06/23 1400     Education   Cardiac Education Topics Pritikin   Customer service manager   Weekly Topic [employer] Desserts   Instruction Review Code 1- Verbalizes Understanding   Class Start Time 1145   Class Stop Time 1222   Class Time Calculation (min) 37 min    Row Name 04/11/23 1300     Education   Cardiac Education Topics Pritikin   Geographical information systems officer Exercise   Exercise Workshop Exercise Basics: Diplomatic Services operational officer   Instruction Review Code 1- Verbalizes Understanding   Class Start Time 1146   Class Stop Time 1235   Class Time Calculation (min) 49 min    Row Name 04/13/23 1600     Education   Cardiac Education Topics Pritikin   Customer service manager   Weekly Topic Efficiency Cooking - Meals in a Snap   Instruction Review Code 1- Verbalizes Understanding   Class Start Time 1145   Class Stop Time 1225   Class Time Calculation (min) 40 min    Row Name 04/18/23 1500     Education   Cardiac Education Topics  Pritikin   Glass blower/designer Nutrition   Nutrition Workshop Targeting Your Nutrition Priorities   Instruction Review Code 1- Verbalizes Understanding   Class Start Time 1150   Class Stop Time 1222   Class Time Calculation (min) 32 min    Row Name 04/20/23 1500     Education   Cardiac Education Topics Pritikin   Customer service manager  Weekly Topic One-Pot Wonders   Instruction Review Code 1- Verbalizes Understanding   Class Start Time 1145   Class Stop Time 1227   Class Time Calculation (min) 42 min    Row Name 05/04/23 1200     Education   Cardiac Education Topics Pritikin   Customer service manager   Weekly Topic Fast Evening Meals   Instruction Review Code 1- Verbalizes Understanding   Class Start Time 1145   Class Stop Time 1225   Class Time Calculation (min) 40 min    Row Name 05/09/23 1500     Education   Cardiac Education Topics Pritikin   Glass blower/designer Nutrition   Nutrition Workshop Fueling a Forensic psychologist   Instruction Review Code 1- TEFL teacher Understanding   Class Start Time 1145   Class Stop Time 1230   Class Time Calculation (min) 45 min    Row Name 05/11/23 1500     Education   Cardiac Education Topics Pritikin   Customer service manager   Weekly Topic International Cuisine- Spotlight on the [employer] Zones   Instruction Review Code 1- Verbalizes Understanding   Class Start Time 1145   Class Stop Time 1225   Class Time Calculation (min) 40 min    Row Name 05/16/23 1600     Education   Cardiac Education Topics Pritikin   Select Workshops     Workshops   Educator Exercise Physiologist   Select Psychosocial   Psychosocial Workshop Healthy Sleep for a Healthy Heart   Instruction Review Code 1- Verbalizes Understanding   Class Start  Time 1148   Class Stop Time 1234   Class Time Calculation (min) 46 min    Row Name 05/23/23 1300     Education   Cardiac Education Topics Pritikin   Select Workshops     Workshops   Educator Exercise Physiologist   Select Exercise   Exercise Workshop Managing Heart Disease: Your Path to a Healthier Heart   Instruction Review Code 1- Verbalizes Understanding   Class Start Time 1145   Class Stop Time 1237   Class Time Calculation (min) 52 min            Core Videos: Exercise    Move It!  Clinical staff conducted group or individual video education with verbal and written material and guidebook.  Patient learns the recommended Pritikin exercise program. Exercise with the goal of living a long, healthy life. Some of the health benefits of exercise include controlled diabetes, healthier blood pressure levels, improved cholesterol levels, improved heart and lung capacity, improved sleep, and better body composition. Everyone should speak with their doctor before starting or changing an exercise routine.  Biomechanical Limitations Clinical staff conducted group or individual video education with verbal and written material and guidebook.  Patient learns how biomechanical limitations can impact exercise and how we can mitigate and possibly overcome limitations to have an impactful and balanced exercise routine.  Body Composition Clinical staff conducted group or individual video education with verbal and written material and guidebook.  Patient learns that body composition (ratio of muscle mass to fat mass) is a key component to assessing overall fitness, rather than body weight alone. Increased fat mass, especially visceral belly fat, can put us  at increased risk for metabolic syndrome, type 2 diabetes, heart disease, and even  death. It is recommended to combine diet and exercise (cardiovascular and resistance training) to improve your body composition. Seek guidance from your physician  and exercise physiologist before implementing an exercise routine.  Exercise Action Plan Clinical staff conducted group or individual video education with verbal and written material and guidebook.  Patient learns the recommended strategies to achieve and enjoy long-term exercise adherence, including variety, self-motivation, self-efficacy, and positive decision making. Benefits of exercise include fitness, good health, weight management, more energy, better sleep, less stress, and overall well-being.  Medical   Heart Disease Risk Reduction Clinical staff conducted group or individual video education with verbal and written material and guidebook.  Patient learns our heart is our most vital organ as it circulates oxygen , nutrients, white blood cells, and hormones throughout the entire body, and carries waste away. Data supports a plant-based eating plan like the Pritikin Program for its effectiveness in slowing progression of and reversing heart disease. The video provides a number of recommendations to address heart disease.   Metabolic Syndrome and Belly Fat  Clinical staff conducted group or individual video education with verbal and written material and guidebook.  Patient learns what metabolic syndrome is, how it leads to heart disease, and how one can reverse it and keep it from coming back. You have metabolic syndrome if you have 3 of the following 5 criteria: abdominal obesity, high blood pressure, high triglycerides, low HDL cholesterol, and high blood sugar.  Hypertension and Heart Disease Clinical staff conducted group or individual video education with verbal and written material and guidebook.  Patient learns that high blood pressure, or hypertension, is very common in the [place] . Hypertension is largely due to excessive salt intake, but other important risk factors include being overweight, physical inactivity, drinking too much alcohol , smoking, and not eating enough potassium  from fruits and vegetables. High blood pressure is a leading risk factor for heart attack, stroke, congestive heart failure, dementia, kidney failure, and premature death. Long-term effects of excessive salt intake include stiffening of the arteries and thickening of heart muscle and organ damage. Recommendations include ways to reduce hypertension and the risk of heart disease.  Diseases of Our Time - Focusing on Diabetes Clinical staff conducted group or individual video education with verbal and written material and guidebook.  Patient learns why the best way to stop diseases of our time is prevention, through food and other lifestyle changes. Medicine (such as prescription pills and surgeries) is often only a Band-Aid on the problem, not a long-term solution. Most common diseases of our time include obesity, type 2 diabetes, hypertension, heart disease, and cancer. The Pritikin Program is recommended and has been proven to help reduce, reverse, and/or prevent the damaging effects of metabolic syndrome.  Nutrition   Overview of the Pritikin Eating Plan  Clinical staff conducted group or individual video education with verbal and written material and guidebook.  Patient learns about the Pritikin Eating Plan for disease risk reduction. The Pritikin Eating Plan emphasizes a wide variety of unrefined, minimally-processed carbohydrates, like fruits, vegetables, whole grains, and legumes. Go, Caution, and Stop food choices are explained. Plant-based and lean animal proteins are emphasized. Rationale provided for low sodium intake for blood pressure control, low added sugars for blood sugar stabilization, and low added fats and oils for coronary artery disease risk reduction and weight management.  Calorie Density  Clinical staff conducted group or individual video education with verbal and written material and guidebook.  Patient learns about calorie density and  how it impacts the Pritikin Eating Plan.  Knowing the characteristics of the food you choose will help you decide whether those foods will lead to weight gain or weight loss, and whether you want to consume more or less of them. Weight loss is usually a side effect of the Pritikin Eating Plan because of its focus on low calorie-dense foods.  Label Reading  Clinical staff conducted group or individual video education with verbal and written material and guidebook.  Patient learns about the Pritikin recommended label reading guidelines and corresponding recommendations regarding calorie density, added sugars, sodium content, and whole grains.  Dining Out - Part 1  Clinical staff conducted group or individual video education with verbal and written material and guidebook.  Patient learns that restaurant meals can be sabotaging because they can be so high in calories, fat, sodium, and/or sugar. Patient learns recommended strategies on how to positively address this and avoid unhealthy pitfalls.  Facts on Fats  Clinical staff conducted group or individual video education with verbal and written material and guidebook.  Patient learns that lifestyle modifications can be just as effective, if not more so, as many medications for lowering your risk of heart disease. A Pritikin lifestyle can help to reduce your risk of inflammation and atherosclerosis (cholesterol build-up, or plaque, in the artery walls). Lifestyle interventions such as dietary choices and physical activity address the cause of atherosclerosis. A review of the types of fats and their impact on blood cholesterol levels, along with dietary recommendations to reduce fat intake is also included.  Nutrition Action Plan  Clinical staff conducted group or individual video education with verbal and written material and guidebook.  Patient learns how to incorporate Pritikin recommendations into their lifestyle. Recommendations include planning and keeping personal health goals in mind as an  important part of their success.  Healthy Mind-Set    Healthy Minds, Bodies, Hearts  Clinical staff conducted group or individual video education with verbal and written material and guidebook.  Patient learns how to identify when they are stressed. Video will discuss the impact of that stress, as well as the many benefits of stress management. Patient will also be introduced to stress management techniques. The way we think, act, and feel has an impact on our hearts.  How Our Thoughts Can Heal Our Hearts  Clinical staff conducted group or individual video education with verbal and written material and guidebook.  Patient learns that negative thoughts can cause depression and anxiety. This can result in negative lifestyle behavior and serious health problems. Cognitive behavioral therapy is an effective method to help control our thoughts in order to change and improve our emotional outlook.  Additional Videos:  Exercise    Improving Performance  Clinical staff conducted group or individual video education with verbal and written material and guidebook.  Patient learns to use a non-linear approach by alternating intensity levels and lengths of time spent exercising to help burn more calories and lose more body fat. Cardiovascular exercise helps improve heart health, metabolism, hormonal balance, blood sugar control, and recovery from fatigue. Resistance training improves strength, endurance, balance, coordination, reaction time, metabolism, and muscle mass. Flexibility exercise improves circulation, posture, and balance. Seek guidance from your physician and exercise physiologist before implementing an exercise routine and learn your capabilities and proper form for all exercise.  Introduction to Yoga  Clinical staff conducted group or individual video education with verbal and written material and guidebook.  Patient learns about yoga, a discipline of the coming  together of mind, breath, and  body. The benefits of yoga include improved flexibility, improved range of motion, better posture and core strength, increased lung function, weight loss, and positive self-image. Yoga's heart health benefits include lowered blood pressure, healthier heart rate, decreased cholesterol and triglyceride levels, improved immune function, and reduced stress. Seek guidance from your physician and exercise physiologist before implementing an exercise routine and learn your capabilities and proper form for all exercise.  Medical   Aging: Enhancing Your Quality of Life  Clinical staff conducted group or individual video education with verbal and written material and guidebook.  Patient learns key strategies and recommendations to stay in good physical health and enhance quality of life, such as prevention strategies, having an advocate, securing a Health Care Proxy and Power of Attorney, and keeping a list of medications and system for tracking them. It also discusses how to avoid risk for bone loss.  Biology of Weight Control  Clinical staff conducted group or individual video education with verbal and written material and guidebook.  Patient learns that weight gain occurs because we consume more calories than we burn (eating more, moving less). Even if your body weight is normal, you may have higher ratios of fat compared to muscle mass. Too much body fat puts you at increased risk for cardiovascular disease, heart attack, stroke, type 2 diabetes, and obesity-related cancers. In addition to exercise, following the Pritikin Eating Plan can help reduce your risk.  Decoding Lab Results  Clinical staff conducted group or individual video education with verbal and written material and guidebook.  Patient learns that lab test reflects one measurement whose values change over time and are influenced by many factors, including medication, stress, sleep, exercise, food, hydration, pre-existing medical conditions, and  more. It is recommended to use the knowledge from this video to become more involved with your lab results and evaluate your numbers to speak with your doctor.   Diseases of Our Time - Overview  Clinical staff conducted group or individual video education with verbal and written material and guidebook.  Patient learns that according to the CDC, 50% to 70% of chronic diseases (such as obesity, type 2 diabetes, elevated lipids, hypertension, and heart disease) are avoidable through lifestyle improvements including healthier food choices, listening to satiety cues, and increased physical activity.  Sleep Disorders Clinical staff conducted group or individual video education with verbal and written material and guidebook.  Patient learns how good quality and duration of sleep are important to overall health and well-being. Patient also learns about sleep disorders and how they impact health along with recommendations to address them, including discussing with a physician.  Nutrition  Dining Out - Part 2 Clinical staff conducted group or individual video education with verbal and written material and guidebook.  Patient learns how to plan ahead and communicate in order to maximize their dining experience in a healthy and nutritious manner. Included are recommended food choices based on the type of restaurant the patient is visiting.   Fueling a Banker conducted group or individual video education with verbal and written material and guidebook.  There is a strong connection between our food choices and our health. Diseases like obesity and type 2 diabetes are very prevalent and are in large-part due to lifestyle choices. The Pritikin Eating Plan provides plenty of food and hunger-curbing satisfaction. It is easy to follow, affordable, and helps reduce health risks.  Menu Workshop  Clinical staff conducted group or individual video  education with verbal and written material and  guidebook.  Patient learns that restaurant meals can sabotage health goals because they are often packed with calories, fat, sodium, and sugar. Recommendations include strategies to plan ahead and to communicate with the manager, chef, or server to help order a healthier meal.  Planning Your Eating Strategy  Clinical staff conducted group or individual video education with verbal and written material and guidebook.  Patient learns about the Pritikin Eating Plan and its benefit of reducing the risk of disease. The Pritikin Eating Plan does not focus on calories. Instead, it emphasizes high-quality, nutrient-rich foods. By knowing the characteristics of the foods, we choose, we can determine their calorie density and make informed decisions.  Targeting Your Nutrition Priorities  Clinical staff conducted group or individual video education with verbal and written material and guidebook.  Patient learns that lifestyle habits have a tremendous impact on disease risk and progression. This video provides eating and physical activity recommendations based on your personal health goals, such as reducing LDL cholesterol, losing weight, preventing or controlling type 2 diabetes, and reducing high blood pressure.  Vitamins and Minerals  Clinical staff conducted group or individual video education with verbal and written material and guidebook.  Patient learns different ways to obtain key vitamins and minerals, including through a recommended healthy diet. It is important to discuss all supplements you take with your doctor.   Healthy Mind-Set    Smoking Cessation  Clinical staff conducted group or individual video education with verbal and written material and guidebook.  Patient learns that cigarette smoking and tobacco addiction pose a serious health risk which affects millions of people. Stopping smoking will significantly reduce the risk of heart disease, lung disease, and many forms of cancer.  Recommended strategies for quitting are covered, including working with your doctor to develop a successful plan.  Culinary   Becoming a Set designer conducted group or individual video education with verbal and written material and guidebook.  Patient learns that cooking at home can be healthy, cost-effective, quick, and puts them in control. Keys to cooking healthy recipes will include looking at your recipe, assessing your equipment needs, planning ahead, making it simple, choosing cost-effective seasonal ingredients, and limiting the use of added fats, salts, and sugars.  Cooking - Breakfast and Snacks  Clinical staff conducted group or individual video education with verbal and written material and guidebook.  Patient learns how important breakfast is to satiety and nutrition through the entire day. Recommendations include key foods to eat during breakfast to help stabilize blood sugar levels and to prevent overeating at meals later in the day. Planning ahead is also a key component.  Cooking - Educational psychologist conducted group or individual video education with verbal and written material and guidebook.  Patient learns eating strategies to improve overall health, including an approach to cook more at home. Recommendations include thinking of animal protein as a side on your plate rather than center stage and focusing instead on lower calorie dense options like vegetables, fruits, whole grains, and plant-based proteins, such as beans. Making sauces in large quantities to freeze for later and leaving the skin on your vegetables are also recommended to maximize your experience.  Cooking - Healthy Salads and Dressing Clinical staff conducted group or individual video education with verbal and written material and guidebook.  Patient learns that vegetables, fruits, whole grains, and legumes are the foundations of the Pritikin Eating Plan. Recommendations include  how  to incorporate each of these in flavorful and healthy salads, and how to create homemade salad dressings. Proper handling of ingredients is also covered. Cooking - Soups and [employer] - Soups and Desserts Clinical staff conducted group or individual video education with verbal and written material and guidebook.  Patient learns that Pritikin soups and desserts make for easy, nutritious, and delicious snacks and meal components that are low in sodium, fat, sugar, and calorie density, while high in vitamins, minerals, and filling fiber. Recommendations include simple and healthy ideas for soups and desserts.   Overview     The Pritikin Solution Program Overview Clinical staff conducted group or individual video education with verbal and written material and guidebook.  Patient learns that the results of the Pritikin Program have been documented in more than 100 articles published in peer-reviewed journals, and the benefits include reducing risk factors for (and, in some cases, even reversing) high cholesterol, high blood pressure, type 2 diabetes, obesity, and more! An overview of the three key pillars of the Pritikin Program will be covered: eating well, doing regular exercise, and having a healthy mind-set.  WORKSHOPS  Exercise: Exercise Basics: Building Your Action Plan Clinical staff led group instruction and group discussion with PowerPoint presentation and patient guidebook. To enhance the learning environment the use of posters, models and videos may be added. At the conclusion of this workshop, patients will comprehend the difference between physical activity and exercise, as well as the benefits of incorporating both, into their routine. Patients will understand the FITT (Frequency, Intensity, Time, and Type) principle and how to use it to build an exercise action plan. In addition, safety concerns and other considerations for exercise and cardiac rehab will be addressed by the  presenter. The purpose of this lesson is to promote a comprehensive and effective weekly exercise routine in order to improve patients' overall level of fitness.   Managing Heart Disease: Your Path to a Healthier Heart Clinical staff led group instruction and group discussion with PowerPoint presentation and patient guidebook. To enhance the learning environment the use of posters, models and videos may be added.At the conclusion of this workshop, patients will understand the anatomy and physiology of the heart. Additionally, they will understand how Pritikin's three pillars impact the risk factors, the progression, and the management of heart disease.  The purpose of this lesson is to provide a high-level overview of the heart, heart disease, and how the Pritikin lifestyle positively impacts risk factors.  Exercise Biomechanics Clinical staff led group instruction and group discussion with PowerPoint presentation and patient guidebook. To enhance the learning environment the use of posters, models and videos may be added. Patients will learn how the structural parts of their bodies function and how these functions impact their daily activities, movement, and exercise. Patients will learn how to promote a neutral spine, learn how to manage pain, and identify ways to improve their physical movement in order to promote healthy living. The purpose of this lesson is to expose patients to common physical limitations that impact physical activity. Participants will learn practical ways to adapt and manage aches and pains, and to minimize their effect on regular exercise. Patients will learn how to maintain good posture while sitting, walking, and lifting.  Balance Training and Fall Prevention  Clinical staff led group instruction and group discussion with PowerPoint presentation and patient guidebook. To enhance the learning environment the use of posters, models and videos may be added. At the  conclusion  of this workshop, patients will understand the importance of their sensorimotor skills (vision, proprioception, and the vestibular system) in maintaining their ability to balance as they age. Patients will apply a variety of balancing exercises that are appropriate for their current level of function. Patients will understand the common causes for poor balance, possible solutions to these problems, and ways to modify their physical environment in order to minimize their fall risk. The purpose of this lesson is to teach patients about the importance of maintaining balance as they age and ways to minimize their risk of falling.  WORKSHOPS   Nutrition:  Fueling a Ship broker led group instruction and group discussion with PowerPoint presentation and patient guidebook. To enhance the learning environment the use of posters, models and videos may be added. Patients will review the foundational principles of the Pritikin Eating Plan and understand what constitutes a serving size in each of the food groups. Patients will also learn Pritikin-friendly foods that are better choices when away from home and review make-ahead meal and snack options. Calorie density will be reviewed and applied to three nutrition priorities: weight maintenance, weight loss, and weight gain. The purpose of this lesson is to reinforce (in a group setting) the key concepts around what patients are recommended to eat and how to apply these guidelines when away from home by planning and selecting Pritikin-friendly options. Patients will understand how calorie density may be adjusted for different weight management goals.  Mindful Eating  Clinical staff led group instruction and group discussion with PowerPoint presentation and patient guidebook. To enhance the learning environment the use of posters, models and videos may be added. Patients will briefly review the concepts of the Pritikin Eating Plan and the  importance of low-calorie dense foods. The concept of mindful eating will be introduced as well as the importance of paying attention to internal hunger signals. Triggers for non-hunger eating and techniques for dealing with triggers will be explored. The purpose of this lesson is to provide patients with the opportunity to review the basic principles of the Pritikin Eating Plan, discuss the value of eating mindfully and how to measure internal cues of hunger and fullness using the Hunger Scale. Patients will also discuss reasons for non-hunger eating and learn strategies to use for controlling emotional eating.  Targeting Your Nutrition Priorities Clinical staff led group instruction and group discussion with PowerPoint presentation and patient guidebook. To enhance the learning environment the use of posters, models and videos may be added. Patients will learn how to determine their genetic susceptibility to disease by reviewing their family history. Patients will gain insight into the importance of diet as part of an overall healthy lifestyle in mitigating the impact of genetics and other environmental insults. The purpose of this lesson is to provide patients with the opportunity to assess their personal nutrition priorities by looking at their family history, their own health history and current risk factors. Patients will also be able to discuss ways of prioritizing and modifying the Pritikin Eating Plan for their highest risk areas  Menu  Clinical staff led group instruction and group discussion with PowerPoint presentation and patient guidebook. To enhance the learning environment the use of posters, models and videos may be added. Using menus brought in from [employer], or printed from [employer], patients will apply the Pritikin dining out guidelines that were presented in the [employer] video. Patients will also be able to practice these guidelines in a variety of  provided scenarios. The purpose of this lesson is to provide patients with the opportunity to practice hands-on learning of the Pritikin Dining Out guidelines with actual menus and practice scenarios.  Label Reading Clinical staff led group instruction and group discussion with PowerPoint presentation and patient guidebook. To enhance the learning environment the use of posters, models and videos may be added. Patients will review and discuss the Pritikin label reading guidelines presented in Pritikin's Label Reading Educational series video. Using fool labels brought in from local grocery stores and markets, patients will apply the label reading guidelines and determine if the packaged food meet the Pritikin guidelines. The purpose of this lesson is to provide patients with the opportunity to review, discuss, and practice hands-on learning of the Pritikin Label Reading guidelines with actual packaged food labels. Cooking School  Pritikin's [employer] are designed to teach patients ways to prepare quick, simple, and affordable recipes at home. The importance of nutrition's role in chronic disease risk reduction is reflected in its emphasis in the overall Pritikin program. By learning how to prepare essential core Pritikin Eating Plan recipes, patients will increase control over what they eat; be able to customize the flavor of foods without the use of added salt, sugar, or fat; and improve the quality of the food they consume. By learning a set of core recipes which are easily assembled, quickly prepared, and affordable, patients are more likely to prepare more healthy foods at home. These workshops focus on convenient breakfasts, simple entres, side dishes, and desserts which can be prepared with minimal effort and are consistent with nutrition recommendations for cardiovascular risk reduction. Cooking [employer] are taught by a Armed forces logistics/support/administrative officer (RD) who has been trained by the  [employer]. The chef or RD has a clear understanding of the importance of minimizing - if not completely eliminating - added fat, sugar, and sodium in recipes. Throughout the series of Cooking School Workshop sessions, patients will learn about healthy ingredients and efficient methods of cooking to build confidence in their capability to prepare    Cooking School weekly topics:  Adding Flavor- Sodium-Free  Fast and Healthy Breakfasts  Powerhouse Plant-Based Proteins  Satisfying Salads and Dressings  Simple Sides and Sauces  International Cuisine-Spotlight on the [employer] Zones  Delicious Desserts  Savory Soups  [employer] - Meals in a Astronomer Appetizers and Snacks  Comforting Weekend Breakfasts  One-Pot Wonders   Fast Evening Meals  Landscape architect Your Pritikin Plate  WORKSHOPS   Healthy Mindset (Psychosocial):  Focused Goals, Sustainable Changes Clinical staff led group instruction and group discussion with PowerPoint presentation and patient guidebook. To enhance the learning environment the use of posters, models and videos may be added. Patients will be able to apply effective goal setting strategies to establish at least one personal goal, and then take consistent, meaningful action toward that goal. They will learn to identify common barriers to achieving personal goals and develop strategies to overcome them. Patients will also gain an understanding of how our mind-set can impact our ability to achieve goals and the importance of cultivating a positive and growth-oriented mind-set. The purpose of this lesson is to provide patients with a deeper understanding of how to set and achieve personal goals, as well as the tools and strategies needed to overcome common obstacles which may arise along the way.  From Head to Heart: The Power of a Healthy Outlook  Clinical staff led group instruction and  group discussion with PowerPoint presentation  and patient guidebook. To enhance the learning environment the use of posters, models and videos may be added. Patients will be able to recognize and describe the impact of emotions and mood on physical health. They will discover the importance of self-care and explore self-care practices which may work for them. Patients will also learn how to utilize the 4 C's to cultivate a healthier outlook and better manage stress and challenges. The purpose of this lesson is to demonstrate to patients how a healthy outlook is an essential part of maintaining good health, especially as they continue their cardiac rehab journey.  Healthy Sleep for a Healthy Heart Clinical staff led group instruction and group discussion with PowerPoint presentation and patient guidebook. To enhance the learning environment the use of posters, models and videos may be added. At the conclusion of this workshop, patients will be able to demonstrate knowledge of the importance of sleep to overall health, well-being, and quality of life. They will understand the symptoms of, and treatments for, common sleep disorders. Patients will also be able to identify daytime and nighttime behaviors which impact sleep, and they will be able to apply these tools to help manage sleep-related challenges. The purpose of this lesson is to provide patients with a general overview of sleep and outline the importance of quality sleep. Patients will learn about a few of the most common sleep disorders. Patients will also be introduced to the concept of "sleep hygiene," and discover ways to self-manage certain sleeping problems through simple daily behavior changes. Finally, the workshop will motivate patients by clarifying the links between quality sleep and their goals of heart-healthy living.   Recognizing and Reducing Stress Clinical staff led group instruction and group discussion with PowerPoint presentation and patient guidebook. To enhance the learning  environment the use of posters, models and videos may be added. At the conclusion of this workshop, patients will be able to understand the types of stress reactions, differentiate between acute and chronic stress, and recognize the impact that chronic stress has on their health. They will also be able to apply different coping mechanisms, such as reframing negative self-talk. Patients will have the opportunity to practice a variety of stress management techniques, such as deep abdominal breathing, progressive muscle relaxation, and/or guided imagery.  The purpose of this lesson is to educate patients on the role of stress in their lives and to provide healthy techniques for coping with it.  Learning Barriers/Preferences:  Learning Barriers/Preferences - 03/10/23 1226       Learning Barriers/Preferences   Learning Barriers Exercise Concerns;Sight   Pt wears reading glasses, dizzy spells, poor balance, assistive device            Education Topics:  Knowledge Questionnaire Score:  Knowledge Questionnaire Score - 03/10/23 1227       Knowledge Questionnaire Score   Pre Score 21/24             Core Components/Risk Factors/Patient Goals at Admission:  Personal Goals and Risk Factors at Admission - 03/10/23 1228       Core Components/Risk Factors/Patient Goals on Admission    Weight Management Yes;Obesity;Weight Loss    Intervention Weight Management: Develop a combined nutrition and exercise program designed to reach desired caloric intake, while maintaining appropriate intake of nutrient and fiber, sodium and fats, and appropriate energy expenditure required for the weight goal.;Weight Management: Provide education and appropriate resources to help participant work on and attain dietary goals.;Weight Management/Obesity: Establish  reasonable short term and long term weight goals.;Obesity: Provide education and appropriate resources to help participant work on and attain dietary goals.     Expected Outcomes Long Term: Adherence to nutrition and physical activity/exercise program aimed toward attainment of established weight goal;Short Term: Continue to assess and modify interventions until short term weight is achieved;Weight Maintenance: Understanding of the daily nutrition guidelines, which includes 25-35% calories from fat, 7% or less cal from saturated fats, less than 200mg  cholesterol, less than 1.5gm of sodium, & 5 or more servings of fruits and vegetables daily;Weight Loss: Understanding of general recommendations for a balanced deficit meal plan, which promotes 1-2 lb weight loss per week and includes a negative energy balance of [phone] kcal/d;Understanding recommendations for meals to include 15-35% energy as protein, 25-35% energy from fat, 35-60% energy from carbohydrates, less than 200mg  of dietary cholesterol, 20-35 gm of total fiber daily;Understanding of distribution of calorie intake throughout the day with the consumption of 4-5 meals/snacks    Diabetes Yes    Intervention Provide education about signs/symptoms and action to take for hypo/hyperglycemia.;Provide education about proper nutrition, including hydration, and aerobic/resistive exercise prescription along with prescribed medications to achieve blood glucose in normal ranges: Fasting glucose 65-99 mg/dL    Expected Outcomes Short Term: Participant verbalizes understanding of the signs/symptoms and immediate care of hyper/hypoglycemia, proper foot care and importance of medication, aerobic/resistive exercise and nutrition plan for blood glucose control.;Long Term: Attainment of HbA1C < 7%.    Heart Failure Yes    Intervention Provide a combined exercise and nutrition program that is supplemented with education, support and counseling about heart failure. Directed toward relieving symptoms such as shortness of breath, decreased exercise tolerance, and extremity edema.    Expected Outcomes Improve functional capacity  of life;Short term: Attendance in program 2-3 days a week with increased exercise capacity. Reported lower sodium intake. Reported increased fruit and vegetable intake. Reports medication compliance.;Short term: Daily weights obtained and reported for increase. Utilizing diuretic protocols set by physician.;Long term: Adoption of self-care skills and reduction of barriers for early signs and symptoms recognition and intervention leading to self-care maintenance.    Hypertension Yes    Intervention Provide education on lifestyle modifcations including regular physical activity/exercise, weight management, moderate sodium restriction and increased consumption of fresh fruit, vegetables, and low fat dairy, alcohol  moderation, and smoking cessation.;Monitor prescription use compliance.    Expected Outcomes Short Term: Continued assessment and intervention until BP is < 140/32mm HG in hypertensive participants. < 130/37mm HG in hypertensive participants with diabetes, heart failure or chronic kidney disease.;Long Term: Maintenance of blood pressure at goal levels.    Lipids Yes    Intervention Provide education and support for participant on nutrition & aerobic/resistive exercise along with prescribed medications to achieve LDL 70mg , HDL >40mg .    Expected Outcomes Short Term: Participant states understanding of desired cholesterol values and is compliant with medications prescribed. Participant is following exercise prescription and nutrition guidelines.;Long Term: Cholesterol controlled with medications as prescribed, with individualized exercise RX and with personalized nutrition plan. Value goals: LDL < 70mg , HDL > 40 mg.    Personal Goal Other Yes    Personal Goal Gain more independance and increase strength    Intervention Will continue to monitor pt and progress workloads as tolerated without sign or symptom    Expected Outcomes Pt will achieve her goals             Core Components/Risk  Factors/Patient Goals Review:   Goals and  Risk Factor Review     Row Name 03/14/23 1416 03/31/23 1528 05/03/23 0949 05/24/23 0844       Core Components/Risk Factors/Patient Goals Review   Personal Goals Review Weight Management/Obesity;Improve shortness of breath with ADL's;Lipids;Heart Failure;Diabetes;Hypertension;Stress Weight Management/Obesity;Improve shortness of breath with ADL's;Lipids;Heart Failure;Diabetes;Hypertension;Stress Weight Management/Obesity;Improve shortness of breath with ADL's;Lipids;Heart Failure;Diabetes;Hypertension;Stress Weight Management/Obesity;Improve shortness of breath with ADL's;Lipids;Heart Failure;Diabetes;Hypertension;Stress    Review Carollee started cardiac rehab on 03/14/23. Kieu did fair with exercise. nonsusutained sinus brady. Oxygen  saturations and CBG's stable. Jacqlyn Matas a rollator for stabilty. Veroncia exercised on oxygen  and was encouraged to take frequent rest breaks. Decklyn is deconditioned. Jericha continues to do well with exercise at cardiac rehab for her fitness level.  Oxygen  saturations, vital signs and CBG's have been stable. Dottie remains decondtioned. Jerald is enjoying participating in cardiac rehab and says that the program has been helpful for her so far. Deeandra continues to do well with exercise at cardiac rehab for her fitness level.  Oxygen  saturations, vital signs and CBG's remain stable.  Catricia has gained 1.7 kg since starting cardiac rehab. Camielle plans to return to exercise on 05/04/23 after being absent with with cold and flu symptoms. Collyn continues to do well with exercise at cardiac rehab for her fitness level.  Oxygen  saturations, vital signs and CBG's remain stable.  Kristen has gained 1.7 kg since starting cardiac rehab. Gean will complete cardiac rehab in May    Expected Outcomes Shaquanda will continue to partipate in cardiac rehab for exercise, nutrition and lifestyle modifications. Beyonca will continue to partipate in cardiac  rehab for exercise, nutrition and lifestyle modifications. Meili will continue to partipate in cardiac rehab for exercise, nutrition and lifestyle modifications. Takya will continue to partipate in cardiac rehab for exercise, nutrition and lifestyle modifications.             Core Components/Risk Factors/Patient Goals at Discharge (Final Review):   Goals and Risk Factor Review - 05/24/23 0844       Core Components/Risk Factors/Patient Goals Review   Personal Goals Review Weight Management/Obesity;Improve shortness of breath with ADL's;Lipids;Heart Failure;Diabetes;Hypertension;Stress    Review Tasia continues to do well with exercise at cardiac rehab for her fitness level.  Oxygen  saturations, vital signs and CBG's remain stable.  Adalynd has gained 1.7 kg since starting cardiac rehab. Hennessey will complete cardiac rehab in May    Expected Outcomes Nana will continue to partipate in cardiac rehab for exercise, nutrition and lifestyle modifications.             ITP Comments:  ITP Comments     Row Name 03/10/23 [phone] 03/14/23 1411 03/31/23 1520 05/03/23 0946 05/24/23 0840   ITP Comments Dr. Gaylyn Keas medical director. Introduction to [facility]ikin education/intensive cardiac rehab. Initial orientation packet reviewed with patient. 30 day ITP Review. Malia started cardiac rehab on 03/14/23. Sugar did fair with exercise for her fitness level. Earlisha is decondtioned. 30 day ITP Review. Alizon has good attendance and participation in  cardiac rehab for her fitness level. 30 day ITP Review. Whitni has good attendance and participation in cardiac rehab for her fitness level. Ardean has been absent due to cold and fever symptoms. Kayleena plans to return to exercise on 05/04/23 30 day ITP Review. Sherron has good attendance and participation in cardiac rehab since her return to exercise post URI.            Comments: See ITP comments.Monte Antonio RN BSN

## 2023-05-25 ENCOUNTER — Encounter (HOSPITAL_COMMUNITY)
Admission: RE | Admit: 2023-05-25 | Discharge: 2023-05-25 | Disposition: A | Discharge status: Home / Self Care | Payer: Medicare Other | Source: Ambulatory Visit | Attending: Cardiology

## 2023-05-25 DIAGNOSIS — Z48812 Encounter for surgical aftercare following surgery on the circulatory system: Secondary | ICD-10-CM | POA: Diagnosis not present

## 2023-05-25 DIAGNOSIS — Z9861 Coronary angioplasty status: Secondary | ICD-10-CM | POA: Diagnosis not present

## 2023-05-30 ENCOUNTER — Encounter (HOSPITAL_COMMUNITY)
Admission: RE | Admit: 2023-05-30 | Discharge: 2023-05-30 | Disposition: A | Discharge status: Home / Self Care | Payer: Medicare Other | Source: Ambulatory Visit | Attending: Cardiology | Admitting: Cardiology

## 2023-05-30 DIAGNOSIS — Z9861 Coronary angioplasty status: Secondary | ICD-10-CM | POA: Diagnosis not present

## 2023-05-30 DIAGNOSIS — Z48812 Encounter for surgical aftercare following surgery on the circulatory system: Secondary | ICD-10-CM | POA: Diagnosis not present

## 2023-06-01 ENCOUNTER — Encounter (HOSPITAL_COMMUNITY)
Admission: RE | Admit: 2023-06-01 | Discharge: 2023-06-01 | Disposition: A | Discharge status: Home / Self Care | Payer: Medicare Other | Source: Ambulatory Visit | Attending: Cardiology

## 2023-06-01 DIAGNOSIS — Z9861 Coronary angioplasty status: Secondary | ICD-10-CM

## 2023-06-01 DIAGNOSIS — Z48812 Encounter for surgical aftercare following surgery on the circulatory system: Secondary | ICD-10-CM | POA: Diagnosis not present

## 2023-06-02 ENCOUNTER — Encounter (HOSPITAL_BASED_OUTPATIENT_CLINIC_OR_DEPARTMENT_OTHER): Payer: Self-pay

## 2023-06-02 ENCOUNTER — Ambulatory Visit (HOSPITAL_BASED_OUTPATIENT_CLINIC_OR_DEPARTMENT_OTHER)
Admission: RE | Admit: 2023-06-02 | Discharge: 2023-06-02 | Disposition: A | Discharge status: Home / Self Care | Source: Ambulatory Visit | Attending: Family Medicine | Admitting: Family Medicine

## 2023-06-02 DIAGNOSIS — Z1231 Encounter for screening mammogram for malignant neoplasm of breast: Secondary | ICD-10-CM | POA: Insufficient documentation

## 2023-06-06 ENCOUNTER — Encounter (HOSPITAL_COMMUNITY)
Admission: RE | Admit: 2023-06-06 | Discharge: 2023-06-06 | Disposition: A | Discharge status: Home / Self Care | Source: Ambulatory Visit | Attending: Cardiology | Admitting: Cardiology

## 2023-06-06 DIAGNOSIS — Z9861 Coronary angioplasty status: Secondary | ICD-10-CM | POA: Insufficient documentation

## 2023-06-06 DIAGNOSIS — Z48812 Encounter for surgical aftercare following surgery on the circulatory system: Secondary | ICD-10-CM | POA: Diagnosis not present

## 2023-06-08 ENCOUNTER — Encounter (HOSPITAL_COMMUNITY)
Admission: RE | Admit: 2023-06-08 | Discharge: 2023-06-08 | Disposition: A | Discharge status: Home / Self Care | Source: Ambulatory Visit | Attending: Cardiology | Admitting: Cardiology

## 2023-06-08 DIAGNOSIS — Z9861 Coronary angioplasty status: Secondary | ICD-10-CM

## 2023-06-08 DIAGNOSIS — Z48812 Encounter for surgical aftercare following surgery on the circulatory system: Secondary | ICD-10-CM | POA: Diagnosis not present

## 2023-06-13 ENCOUNTER — Encounter (HOSPITAL_COMMUNITY): Admission: RE | Admit: 2023-06-13 | Source: Ambulatory Visit

## 2023-06-13 ENCOUNTER — Telehealth (HOSPITAL_COMMUNITY): Payer: Self-pay

## 2023-06-13 NOTE — Telephone Encounter (Signed)
 Patient's husband called stating patient cut their toe and is unable to put tennis shoes on, won't be in for 11:45am class this morning.

## 2023-06-15 ENCOUNTER — Encounter (HOSPITAL_COMMUNITY): Admission: RE | Admit: 2023-06-15 | Source: Ambulatory Visit

## 2023-06-15 ENCOUNTER — Telehealth (HOSPITAL_COMMUNITY): Payer: Self-pay

## 2023-06-15 NOTE — Telephone Encounter (Signed)
 Patient c/o for 11:45am class today, states her toe is not healed and she had 3x skin cancers removed from her back yesterday and they spots are still bleeding when she moves. Expects to be in class Monday.

## 2023-06-20 ENCOUNTER — Encounter (HOSPITAL_COMMUNITY)
Admission: RE | Admit: 2023-06-20 | Discharge: 2023-06-20 | Disposition: A | Discharge status: Home / Self Care | Source: Ambulatory Visit | Attending: Cardiology | Admitting: Cardiology

## 2023-06-20 VITALS — Ht 61.0 in | Wt 229.5 lb

## 2023-06-20 DIAGNOSIS — Z48812 Encounter for surgical aftercare following surgery on the circulatory system: Secondary | ICD-10-CM | POA: Diagnosis not present

## 2023-06-20 DIAGNOSIS — Z9861 Coronary angioplasty status: Secondary | ICD-10-CM

## 2023-06-21 NOTE — Progress Notes (Signed)
 Cardiac Individual Treatment Plan  Patient Details  Name: ROSA WYLY [name]: [id number] Date of Birth: 1945/04/09 Referring Provider:   Flowsheet Row INTENSIVE CARDIAC REHAB ORIENT from 03/10/2023 in [facility] for Heart, Vascular, & Lung Health  Referring Provider Dr. Randene Bustard, MD       Initial Encounter Date:  Flowsheet Row INTENSIVE CARDIAC REHAB ORIENT from 03/10/2023 in [facility] for Heart, Vascular, & Lung Health  Date 03/10/23       Visit Diagnosis: 11/24/22 S/P PTCA RCA  Patient's Home Medications on Admission:  Current Outpatient Medications:    acetaminophen  (TYLENOL ) 500 MG tablet, Take 500 mg by mouth every 6 (six) hours as needed for mild pain, moderate pain, fever or headache., Disp: , Rfl:    albuterol  (VENTOLIN  HFA) 108 (90 Base) MCG/ACT inhaler, Inhale 2 puffs into the lungs every 4 (four) hours as needed for wheezing or shortness of breath. , Disp: , Rfl:    ALPRAZolam  (XANAX ) 0.5 MG tablet, Take 0.5-1 mg by mouth See admin instructions. Take 0.5 mg  morning at lunch and 1 mg at bedtime, Disp: , Rfl:    B Complex-C (B-COMPLEX WITH VITAMIN C) tablet, Take 1 tablet by mouth in the morning., Disp: , Rfl:    baclofen (LIORESAL) 10 MG tablet, Take 10 mg by mouth at bedtime., Disp: , Rfl:    bisacodyl  (DULCOLAX) 5 MG EC tablet, Take 5 mg by mouth daily as needed for moderate constipation., Disp: , Rfl:    buPROPion  (WELLBUTRIN  XL) 300 MG 24 hr tablet, Take 300 mg by mouth daily., Disp: , Rfl:    carboxymethylcellulose (REFRESH PLUS) 0.5 % SOLN, Place 1 drop into both eyes in the morning, at noon, and at bedtime., Disp: , Rfl:    Cholecalciferol  (VITAMIN D3) 50 MCG (2000 UT) capsule, Take 2,000 Units by mouth daily in the afternoon., Disp: , Rfl:    clopidogrel  (PLAVIX ) 75 MG tablet, Take 75 mg by mouth every morning., Disp: , Rfl:    DULoxetine  (CYMBALTA ) 30 MG capsule, Take 1 capsule (30 mg total) by mouth daily.,  Disp: 30 capsule, Rfl: 0   ELIQUIS  5 MG TABS tablet, TAKE ONE (1) TABLET BY MOUTH TWO (2) TIMES DAILY, Disp: 180 tablet, Rfl: 3   ezetimibe  (ZETIA ) 10 MG tablet, Take 1 tablet (10 mg total) by mouth at bedtime., Disp: 90 tablet, Rfl: 3   fluocinonide cream (LIDEX) 0.05 %, Apply 1 application. topically 2 (two) times daily as needed (irritation)., Disp: , Rfl:    Fluticasone-Salmeterol (ADVAIR) 250-50 MCG/DOSE AEPB, Inhale 1 puff into the lungs daily., Disp: , Rfl:    gabapentin  (NEURONTIN ) 600 MG tablet, Take 600-1,200 mg by mouth See admin instructions. Take 1 tablet (600 mg) by mouth in the morning & take 2 tablets (1200 mg) by mouth at bedtime, Disp: , Rfl:    inclisiran (LEQVIO ) 284 MG/1.5ML SOSY injection, Inject 284 mg into the skin every 6 (six) months., Disp: , Rfl:    Insulin  Glargine (LANTUS  SOLOSTAR) 100 UNIT/ML Solostar Pen, Inject 0-8 Units into the skin at bedtime. Per sliding scale, Disp: , Rfl:    insulin  lispro (HUMALOG ) 100 UNIT/ML KwikPen, Inject 4-6 Units into the skin See admin instructions. Sliding scale Inject 8 units subcutaneously prior to breakfast and supper; add 4 units for CBG >200, Disp: , Rfl:    loperamide  (IMODIUM  A-D) 2 MG tablet, Take 2 mg by mouth 4 (four) times daily as needed for diarrhea or  loose stools., Disp: , Rfl:    Multiple Vitamin (MULTIVITAMIN WITH MINERALS) TABS tablet, Take 1 tablet by mouth every morning. Centrum - Women over 50, Disp: , Rfl:    Multiple Vitamins-Minerals (OCUVITE EYE HEALTH FORMULA) CAPS, Take 1 capsule by mouth every morning., Disp: , Rfl:    nitroGLYCERIN  (NITROSTAT ) 0.3 MG SL tablet, DISSOLVE 1 TABLET UNDER TONGUE EVERY 5 MINUTES FOR UP TO 3 DOSES AS NEEDED FOR CHEST PAIN, Disp: 100 tablet, Rfl: 0   ondansetron  (ZOFRAN -ODT) 4 MG disintegrating tablet, Take 4 mg by mouth 2 (two) times daily., Disp: , Rfl:    OXYGEN , Inhale 2 L into the lungs at bedtime. Additional during the day, Disp: , Rfl:    pantoprazole  (PROTONIX ) 40 MG tablet,  Take 1 tablet (40 mg total) by mouth daily., Disp: 30 tablet, Rfl: 0   potassium chloride  (KLOR-CON  M) 10 MEQ tablet, Patient takes 1 tablet by mouth every 3rd day as directed (Patient not taking: Reported on 04/12/2023), Disp: 15 tablet, Rfl: 5   pyridOXINE (VITAMIN B6) 100 MG tablet, Take 100 mg by mouth every morning., Disp: , Rfl:    ranolazine  (RANEXA ) 1000 MG SR tablet, Take 1 tablet (1,000 mg total) by mouth 2 (two) times daily., Disp: 180 tablet, Rfl: 3   rosuvastatin  (CRESTOR ) 40 MG tablet, Take 1 tablet (40 mg total) by mouth daily., Disp: 90 tablet, Rfl: 3   sacubitril -valsartan  (ENTRESTO ) 97-103 MG, Take 1 tablet by mouth 2 (two) times daily., Disp: 180 tablet, Rfl: 3   Semaglutide , 2 MG/DOSE, (OZEMPIC , 2 MG/DOSE,) 8 MG/3ML SOPN, Inject 2 mg into the skin once a week., Disp: 3 mL, Rfl: 11   spironolactone  (ALDACTONE ) 25 MG tablet, Take 1 tablet (25 mg total) by mouth daily., Disp: 90 tablet, Rfl: 3   torsemide  (DEMADEX ) 20 MG tablet, Take 1 tablet by mouth every 3rd day as directed., Disp: 15 tablet, Rfl: 5   traZODone  (DESYREL ) 50 MG tablet, Take 100-150 mg by mouth at bedtime., Disp: , Rfl:    vitamin B-12 (CYANOCOBALAMIN ) 1000 MCG tablet, Take 1,000 mcg by mouth every morning. , Disp: , Rfl:    zolpidem  (AMBIEN ) 10 MG tablet, Take 10 mg by mouth at bedtime., Disp: , Rfl:   Past Medical History: Past Medical History:  Diagnosis Date   Anemia    hx   Anxiety    Asthma    Basal cell carcinoma 05/2014   "left shoulder"   Bundle branch block, left    chronic/notes 07/18/2013   CHF (congestive heart failure) (HCC)    Chronic insomnia 05/06/2015   Chronic kidney disease    frequency, sees dr Verlena Glenn every 4 to 6 months (01/16/2018)   Chronic lower back pain    Claustrophobia    Common migraine 05/14/2014   Coronary artery disease    MI in 2001, 2002, 2006, 2011, 2014   Depression    Diabetic peripheral neuropathy (HCC) 01/12/2019   GERD (gastroesophageal reflux disease)     H/O hiatal hernia    Headache    "at least 2/month" (01/16/2018)   Heart murmur    Hyperlipidemia    Hypertension    Memory change 05/14/2014   Migraine    "5-6/year"  (01/16/2018)   Obesity 01-2010   Obstructive sleep apnea    "can't wear machine; I have claustrophobia" (01/16/2018), states she had a 2nd sleep study and does not have sleep apnea, her O2 decreases and now is on 2 L of O2 at night.  On home oxygen  therapy    "2L at night and prn during daytime" (01/16/2018)   Osteoarthritis    "knees and hands" (01/16/2018)   Peripheral vascular disease (HCC)    ? numbness, tingling arms and legs   PONV (postoperative nausea and vomiting)    Stroke (HCC)    2014, 2015, 2016   Type II diabetes mellitus (HCC)    Ventral hernia    hx of    Tobacco Use: Social History   Tobacco Use  Smoking Status Never  Smokeless Tobacco Never    Labs: Review Flowsheet  More data exists      Latest Ref Rng & Units 04/20/2021 01/13/2022 08/26/2022 10/22/2022 10/30/2022  Labs for ITP Cardiac and Pulmonary Rehab  Cholestrol 0 - 200 mg/dL - 409  811  - 81   LDL (calc) 0 - 99 mg/dL - 914  83  - 25   HDL-C >40 mg/dL - 45  65  - 49   Trlycerides <150 mg/dL - 782  85  - 36   Hemoglobin A1c 4.8 - 5.6 % - - - 6.1  -  Bicarbonate 20.0 - 28.0 mmol/L 20.0 - 28.0 mmol/L 27.2  27.5  - - 28.2  28.7  -  TCO2 22 - 32 mmol/L 22 - 32 mmol/L 29  29  - - 30  30  -  O2 Saturation % % 79  80  - - 68  64  -    Details       Multiple values from one day are sorted in reverse-chronological order         Capillary Blood Glucose: Lab Results  Component Value Date   GLUCAP 156 (H) 03/21/2023   GLUCAP 135 (H) 03/21/2023   GLUCAP 130 (H) 03/16/2023   GLUCAP 136 (H) 03/16/2023   GLUCAP 157 (H) 03/14/2023     Exercise Target Goals: Exercise Program Goal: Individual exercise prescription set using results from initial 6 min walk test and THRR while considering  patient's activity barriers and  safety.   Exercise Prescription Goal: Initial exercise prescription builds to 30-45 minutes a day of aerobic activity, 2-3 days per week.  Home exercise guidelines will be given to patient during program as part of exercise prescription that the participant will acknowledge.  Activity Barriers & Risk Stratification:  Activity Barriers & Cardiac Risk Stratification - 03/10/23 1217       Activity Barriers & Cardiac Risk Stratification   Activity Barriers Joint Problems;Assistive Device;Deconditioning;Muscular Weakness;Arthritis;Back Problems;Shortness of Breath;Decreased Ventricular Function;Chest Pain/Angina;Balance Concerns;History of Falls;Neck/Spine Problems    Cardiac Risk Stratification High             6 Minute Walk:  6 Minute Walk     Row Name 03/10/23 1209 06/20/23 1430       6 Minute Walk   Phase Initial Discharge  NUSTEP TEST    Distance 1312 feet 2690 feet    Distance % Change -- 105.03 %    Distance Feet Change -- 1308 ft    Walk Time 6 minutes 6 minutes    # of Rest Breaks 1 0    MPH 2.48 --    METS 1.4 2.9    RPE 11 14    Perceived Dyspnea  1 2    VO2 Peak 4.89 19.3    Symptoms Yes (comment) Yes (comment)    Comments Supplemental Oxygen  during walk test prior to CP. Right side CP 4 mins in, then radiated to right side,  discontinued Stepper test 5 mins in due to cp 2/10. CP resolved within 2 mins. Back to intake room pt became dizzy, eyes closed and unsteady, sat down BP 118/70, H2o recheck 5 mins 128/70. Dissy spell resolved within 1 min. AVG watts: 13, AVG MET's:1.5, Total steps: 471, AVG km: 0.40, AVG SPM: 89 SOB    Resting HR 50 bpm 60 bpm    Resting BP 142/64 124/64    Resting Oxygen  Saturation  96 % 97 %    Exercise Oxygen  Saturation  during 6 min walk 100 % 98 %  2L    Max Ex. HR 62 bpm 113 bpm    Max Ex. BP 124/70 186/60    2 Minute Post BP 118/70 162/58             Oxygen  Initial Assessment:   Oxygen  Re-Evaluation:   Oxygen  Discharge  (Final Oxygen  Re-Evaluation):   Initial Exercise Prescription:  Initial Exercise Prescription - 03/10/23 1200       Date of Initial Exercise RX and Referring Provider   Date 03/10/23    Referring Provider Dr. Randene Bustard, MD    Expected Discharge Date 06/01/23      Oxygen    Oxygen  Intermittent    Liters 2    Maintain Oxygen  Saturation 88% or higher      NuStep   Level 1    SPM 60    Minutes 20    METs 1.3      Prescription Details   Frequency (times per week) 2    Duration Progress to 30 minutes of continuous aerobic without signs/symptoms of physical distress      Intensity   THRR 40-80% of Max Heartrate 58-116    Ratings of Perceived Exertion 11-13    Perceived Dyspnea 0-4      Progression   Progression Continue progressive overload as per policy without signs/symptoms or physical distress.      Resistance Training   Training Prescription Yes    Weight 1    Reps 10-15             Perform Capillary Blood Glucose checks as needed.  Exercise Prescription Changes:   Exercise Prescription Changes     Row Name 03/14/23 1400 04/04/23 1430 04/20/23 1500 05/04/23 1400 05/11/23 1500     Response to Exercise   Blood Pressure (Admit) 128/62 126/68 120/70 148/70 142/68   Blood Pressure (Exercise) 122/78 124/60 -- -- --   Blood Pressure (Exit) 114/76 120/60 104/70 110/70 102/58   Heart Rate (Admit) 45 bpm 63 bpm 55 bpm 54 bpm 53 bpm   Heart Rate (Exercise) 71 bpm 566 bpm 112 bpm 74 bpm 88 bpm   Heart Rate (Exit) 49 bpm 56 bpm 44 bpm 57 bpm 60 bpm   Oxygen  Saturation (Admit) -- 99 % 98 % 97 % 98 %   Oxygen  Saturation (Exercise) 97 % 98 % 97 % 98 % 98 %   Oxygen  Saturation (Exit) -- 99 % 99 % 98 % 99 %   Rating of Perceived Exertion (Exercise) 10 11 11 13 11    Perceived Dyspnea (Exercise) 1 1 1 1 1    Symptoms SOB SOB SOB, RPD = 1 SOB, RPD = 1 SOB, RPD = 1   Comments Pt's first day in the CRP2 program Reviewed METs Reviewed METs and goals Reviewed METs Reviewed  home exercise Rx   Duration Progress to 30 minutes of  aerobic without signs/symptoms of physical distress Progress to 30 minutes of  aerobic without signs/symptoms of physical distress Continue with 30 min of aerobic exercise without signs/symptoms of physical distress. Continue with 30 min of aerobic exercise without signs/symptoms of physical distress. Continue with 30 min of aerobic exercise without signs/symptoms of physical distress.   Intensity THRR unchanged THRR unchanged THRR unchanged THRR unchanged THRR unchanged     Progression   Progression Continue to progress workloads to maintain intensity without signs/symptoms of physical distress. Continue to progress workloads to maintain intensity without signs/symptoms of physical distress. Continue to progress workloads to maintain intensity without signs/symptoms of physical distress. Continue to progress workloads to maintain intensity without signs/symptoms of physical distress. Continue to progress workloads to maintain intensity without signs/symptoms of physical distress.   Average METs 1.4 1.7 2.2 2 2.1     Resistance Training   Training Prescription Yes Yes No No No   Weight 1 lb 1 lb No weights on Wednesdays No weights on Wednesdays No weights on Wednesdays   Reps 10-15 10-15 -- -- --   Time 10 Minutes 10 Minutes -- -- --     Interval Training   Interval Training -- -- -- -- No     Oxygen    Oxygen  Continuous Continuous Continuous Continuous Continuous   Liters 2 2 2 2 2      NuStep   Level 1 1 1 1 2    SPM 71 98 114 107 115   Minutes 18 26 30 30 30    METs 1.4 1.7 2.2 2 2.1     Home Exercise Plan   Plans to continue exercise at -- -- -- -- Home (comment)   Frequency -- -- -- -- Add 3 additional days to program exercise sessions.   Initial Home Exercises Provided -- -- -- -- 05/11/23     Oxygen    Maintain Oxygen  Saturation 88% or higher 88% or higher 88% or higher 88% or higher 88% or higher    Row Name 05/18/23 1400  06/01/23 1500           Response to Exercise   Blood Pressure (Admit) 108/68 128/72      Blood Pressure (Exercise) 138/62 --      Blood Pressure (Exit) 104/72 120/78      Heart Rate (Admit) 61 bpm 46 bpm      Heart Rate (Exercise) 91 bpm 89 bpm      Heart Rate (Exit) 68 bpm 55 bpm      Oxygen  Saturation (Admit) 99 % 97 %      Oxygen  Saturation (Exercise) 98 % 96 %      Oxygen  Saturation (Exit) 98 % 97 %      Rating of Perceived Exertion (Exercise) 12 12      Perceived Dyspnea (Exercise) 2 2      Symptoms SOB, RPD = 2 SOB, RPD = 2      Comments Reviewed METs and goals Reviewed METs      Duration Continue with 30 min of aerobic exercise without signs/symptoms of physical distress. Continue with 30 min of aerobic exercise without signs/symptoms of physical distress.      Intensity THRR unchanged THRR unchanged        Progression   Progression Continue to progress workloads to maintain intensity without signs/symptoms of physical distress. Continue to progress workloads to maintain intensity without signs/symptoms of physical distress.      Average METs 2.2 2.4        Resistance Training   Training Prescription No No  Weight No weights on Wednesdays No weights on Wednesdays        Interval Training   Interval Training No No        Oxygen    Oxygen  Continuous Continuous      Liters 2 2        NuStep   Level 2 3      SPM 111 118      Minutes 30 30      METs 2.2 2.4        Home Exercise Plan   Plans to continue exercise at Home (comment) Home (comment)      Frequency Add 3 additional days to program exercise sessions. Add 3 additional days to program exercise sessions.      Initial Home Exercises Provided 05/11/23 05/11/23        Oxygen    Maintain Oxygen  Saturation 88% or higher 88% or higher               Exercise Comments:   Exercise Comments     Row Name 03/14/23 1411 04/04/23 1430 04/20/23 1500 05/04/23 1407 05/11/23 1500   Exercise Comments Pt's first  day in the CRP2 program. Pt is very deconditioned. Pt was able to complete 18 minutes on the nustep with several breaks. Pt denies any angina with activity. Pt did have SOB with RPD =1 on 2L/min via n/c (SaO2 = 97%).  Will continue to use Nustep as her exercise modality. Pt ambulates with rolling walker. Reviewed METs. Kamela is making slow progress. Her METs have increased and she is now up to 26 minutes form 18 on day one. She continues to take breaks to rest. Reviewed METs and goals. Pt has been able to increase her exericse time to 30 minutes, up from 18 on day one. Pt has to stop and take fewer breaks to rest. Pt also has been able to increse her SPM and METs on the Nustep. Pt is making good progress. Reviewed METs. Pt is returning after cold. Told to pace herself today. Pt has made progress, but METs down to today due to absence. Reviewed home exercise Rx. Pt exercises at home 2-3x/week in addtion to 2x/week here in the cRP2 program. Pt uses floor cycle ergometer. Pt also walks laps in her home. Pt voices she exercise for 30-60 minutes 2-3 times per week. Pt will continue this routine. Provided links to chair exercises for her to use as well.    Row Name 05/18/23 1426 06/01/23 1500 06/02/23 0853 06/20/23 1236     Exercise Comments Reviewed METs. Pt is making slow progress. Reviewed METs. Pt feels able to increase to level 3 on Nustep today. Pt is progressing. -- Pt completed , pt improved signifcantly compared to initial test. Pt plans to exercise at home by walking and using home equipment.             Exercise Goals and Review:   Exercise Goals Re-Evaluation :  Exercise Goals Re-Evaluation     Row Name 03/14/23 1409 04/20/23 1500           Exercise Goal Re-Evaluation   Exercise Goals Review Increase Physical Activity;Increase Strength and Stamina;Able to understand and use rate of perceived exertion (RPE) scale;Knowledge and understanding of Target Heart Rate Range  (THRR);Understanding of Exercise Prescription Increase Physical Activity;Increase Strength and Stamina;Able to understand and use rate of perceived exertion (RPE) scale;Knowledge and understanding of Target Heart Rate Range (THRR);Understanding of Exercise Prescription      Comments Pt's first  day in the CRP2 program. Pt understands the exercise Rx, THRR and RPE scale. Reviewed METs and goals. Pt voices some imrpovement in her strength which is a goal.      Expected Outcomes Will continue to montior and progress exercise workloads as tolerated. Will continue to montior and progress exercise workloads as tolerated.               Discharge Exercise Prescription (Final Exercise Prescription Changes):  Exercise Prescription Changes - 06/01/23 1500       Response to Exercise   Blood Pressure (Admit) 128/72    Blood Pressure (Exit) 120/78    Heart Rate (Admit) 46 bpm    Heart Rate (Exercise) 89 bpm    Heart Rate (Exit) 55 bpm    Oxygen  Saturation (Admit) 97 %    Oxygen  Saturation (Exercise) 96 %    Oxygen  Saturation (Exit) 97 %    Rating of Perceived Exertion (Exercise) 12    Perceived Dyspnea (Exercise) 2    Symptoms SOB, RPD = 2    Comments Reviewed METs    Duration Continue with 30 min of aerobic exercise without signs/symptoms of physical distress.    Intensity THRR unchanged      Progression   Progression Continue to progress workloads to maintain intensity without signs/symptoms of physical distress.    Average METs 2.4      Resistance Training   Training Prescription No    Weight No weights on Wednesdays      Interval Training   Interval Training No      Oxygen    Oxygen  Continuous    Liters 2      NuStep   Level 3    SPM 118    Minutes 30    METs 2.4      Home Exercise Plan   Plans to continue exercise at Home (comment)    Frequency Add 3 additional days to program exercise sessions.    Initial Home Exercises Provided 05/11/23      Oxygen    Maintain Oxygen   Saturation 88% or higher             Nutrition:  Target Goals: Understanding of nutrition guidelines, daily intake of sodium 1500mg , cholesterol 200mg , calories 30% from fat and 7% or less from saturated fats, daily to have 5 or more servings of fruits and vegetables.  Biometrics:  Pre Biometrics - 06/20/23 1432       Pre Biometrics   % Body Fat 55.7 %             Post Biometrics - 06/20/23 1432        Post  Biometrics   Height 5\' 1"  (1.549 m)    Weight 104.1 kg    Waist Circumference 47 inches    Hip Circumference 56 inches    Waist to Hip Ratio 0.84 %    BMI (Calculated) 43.39    Triceps Skinfold 45 mm    Grip Strength 8 kg    Flexibility --   Bacck pain and issues   Single Leg Stand --   Pt uses walker            Nutrition Therapy Plan and Nutrition Goals:  Nutrition Therapy & Goals - 06/06/23 1428       Nutrition Therapy   Diet Heart Healthy Diet    Drug/Food Interactions Statins/Certain Fruits      Personal Nutrition Goals   Nutrition Goal Patient to identify strategies for reducing cardiovascular risk  by attending the [facility] education and nutrition series weekly.   goal in progress.   Personal Goal #2 Patient to improve diet quality by using the plate method as a guide for meal planning to include lean protein/plant protein, fruits, vegetables, whole grains, nonfat dairy as part of a well-balanced diet.   goal in progress.   Personal Goal #3 Patient to identify strategies for weight loss of 0.5-2.0# per week.   goal not met.   Comments Goals in progress. Shavell has completed cardiac rehab >4x in the past. She has medical history of CAD, CABG x5, CVA, CHF, carotid stent, DM2, HTN, OSA. Lipids are at goal. A1c is well controlled at 6.1, She is up 4.6# since starting with our program. She continues to attend the [facility] education and nutrition series regularly. We have discussed multiple strategies for weight loss including calorie density, protein  shakes, high protein/high fiber intake, Healthy Weight & Wellness, etc. including Patient will continue to benefit from participation in intensive cardiac rehab for nutrition, exercise, and lifestyle modification.      Intervention Plan   Intervention Prescribe, educate and counsel regarding individualized specific dietary modifications aiming towards targeted core components such as weight, hypertension, lipid management, diabetes, heart failure and other comorbidities.;Nutrition handout(s) given to patient.    Expected Outcomes Short Term Goal: Understand basic principles of dietary content, such as calories, fat, sodium, cholesterol and nutrients.;Long Term Goal: Adherence to prescribed nutrition plan.             Nutrition Assessments:  MEDIFICTS Score Key: >=70 Need to make dietary changes  40-70 Heart Healthy Diet <= 40 Therapeutic Level Cholesterol Diet   Flowsheet Row INTENSIVE CARDIAC REHAB from 03/14/2023 in [facility] for Heart, Vascular, & Lung Health  Picture Your Plate Total Score on Admission 74      Picture Your Plate Scores: <91 Unhealthy dietary pattern with much room for improvement. 41-50 Dietary pattern unlikely to meet recommendations for good health and room for improvement. 51-60 More healthful dietary pattern, with some room for improvement.  >60 Healthy dietary pattern, although there may be some specific behaviors that could be improved.    Nutrition Goals Re-Evaluation:  Nutrition Goals Re-Evaluation     Row Name 04/04/23 1521 05/06/23 1029 06/06/23 1428         Goals   Current Weight 223 lb 12.3 oz (101.5 kg) 229 lb 11.5 oz (104.2 kg) 230 lb 9.6 oz (104.6 kg)     Comment lipids WNL, LDL 25, Cr 1.57, GFR 24, Lpa 229, A1c 6.1 GFR 34, Cr 1.56; other most recent labs lipids WNL, LDL 25, Lpa 229, A1c 6.1 no new labs; most recent labs GFR 34, Cr 1.56; other most recent labs lipids WNL, LDL 25, Lpa 229, A1c 6.1     Expected  Outcome Goals in progress. Verlee has completed cardiac rehab >4x in the past. She has medical history of CAD, CABG x5, CVA, CHF, carotid stent, DM2, HTN, OSA. Lipids are at goal. A1c is well controlled at 6.1, She is down 2.2# since starting with our program. Patient will continue to benefit from participation in intensive cardiac rehab for nutrition, exercise, and lifestyle modification. Goals in progress. Tashiya has completed cardiac rehab >4x in the past. She has medical history of CAD, CABG x5, CVA, CHF, carotid stent, DM2, HTN, OSA. Lipids are at goal. A1c is well controlled at 6.1, She is up 3.7# since starting with our program. Patient will continue to benefit  from participation in intensive cardiac rehab for nutrition, exercise, and lifestyle modification. Goals in progress. Catalea has completed cardiac rehab >4x in the past. She has medical history of CAD, CABG x5, CVA, CHF, carotid stent, DM2, HTN, OSA. Lipids are at goal. A1c is well controlled at 6.1, She is up 4.6# since starting with our program. She continues to attend the [facility] education and nutrition series regularly. We have discussed multiple strategies for weight loss including calorie density, protein shakes, high protein/high fiber intake, Healthy Weight & Wellness, etc. including Patient will continue to benefit from participation in intensive cardiac rehab for nutrition, exercise, and lifestyle modification.              Nutrition Goals Re-Evaluation:  Nutrition Goals Re-Evaluation     Row Name 04/04/23 1521 05/06/23 1029 06/06/23 1428         Goals   Current Weight 223 lb 12.3 oz (101.5 kg) 229 lb 11.5 oz (104.2 kg) 230 lb 9.6 oz (104.6 kg)     Comment lipids WNL, LDL 25, Cr 1.57, GFR 24, Lpa 229, A1c 6.1 GFR 34, Cr 1.56; other most recent labs lipids WNL, LDL 25, Lpa 229, A1c 6.1 no new labs; most recent labs GFR 34, Cr 1.56; other most recent labs lipids WNL, LDL 25, Lpa 229, A1c 6.1     Expected Outcome Goals in  progress. Connor has completed cardiac rehab >4x in the past. She has medical history of CAD, CABG x5, CVA, CHF, carotid stent, DM2, HTN, OSA. Lipids are at goal. A1c is well controlled at 6.1, She is down 2.2# since starting with our program. Patient will continue to benefit from participation in intensive cardiac rehab for nutrition, exercise, and lifestyle modification. Goals in progress. Ashna has completed cardiac rehab >4x in the past. She has medical history of CAD, CABG x5, CVA, CHF, carotid stent, DM2, HTN, OSA. Lipids are at goal. A1c is well controlled at 6.1, She is up 3.7# since starting with our program. Patient will continue to benefit from participation in intensive cardiac rehab for nutrition, exercise, and lifestyle modification. Goals in progress. Charlotte has completed cardiac rehab >4x in the past. She has medical history of CAD, CABG x5, CVA, CHF, carotid stent, DM2, HTN, OSA. Lipids are at goal. A1c is well controlled at 6.1, She is up 4.6# since starting with our program. She continues to attend the [facility] education and nutrition series regularly. We have discussed multiple strategies for weight loss including calorie density, protein shakes, high protein/high fiber intake, Healthy Weight & Wellness, etc. including Patient will continue to benefit from participation in intensive cardiac rehab for nutrition, exercise, and lifestyle modification.              Nutrition Goals Discharge (Final Nutrition Goals Re-Evaluation):  Nutrition Goals Re-Evaluation - 06/06/23 1428       Goals   Current Weight 230 lb 9.6 oz (104.6 kg)    Comment no new labs; most recent labs GFR 34, Cr 1.56; other most recent labs lipids WNL, LDL 25, Lpa 229, A1c 6.1    Expected Outcome Goals in progress. Desaree has completed cardiac rehab >4x in the past. She has medical history of CAD, CABG x5, CVA, CHF, carotid stent, DM2, HTN, OSA. Lipids are at goal. A1c is well controlled at 6.1, She is up 4.6# since  starting with our program. She continues to attend the [facility] education and nutrition series regularly. We have discussed multiple strategies for weight loss including calorie  density, protein shakes, high protein/high fiber intake, Healthy Weight & Wellness, etc. including Patient will continue to benefit from participation in intensive cardiac rehab for nutrition, exercise, and lifestyle modification.             Psychosocial: Target Goals: Acknowledge presence or absence of significant depression and/or stress, maximize coping skills, provide positive support system. Participant is able to verbalize types and ability to use techniques and skills needed for reducing stress and depression.  Initial Review & Psychosocial Screening:  Initial Psych Review & Screening - 03/10/23 1037       Initial Review   Current issues with Current Depression;History of Depression;Current Sleep Concerns;Current Psychotropic Meds;Current Anxiety/Panic;Current Stress Concerns    Source of Stress Concerns Chronic Illness;Family;Transportation;Unable to participate in former interests or hobbies;Unable to perform yard/household activities    Comments Yosselin says that she is depresseed. The anitdepressants that Terisa is taking is helping control her depression. Amand says the sleep medication she takes helps her go to sleep. Chesnie admits to having difficulty staying asleep. Halena says she feels at time she is a burdeon to her family. Anamaria does not drive any longer. Niketa relies on her husband Myrtie Atkinson to take her out to appointments, Doctor's appointments, ETC. Rebecka denies the suicidal ideations. Angal denies the need for counselling at this time.      Family Dynamics   Good Support System? Yes   Akeila has her husband Myrtie Atkinson, her daughter and two grandchildren for support.   Comments Marcos will have controlled or decreased depression upon completion of cardiac rehab.      Barriers   Psychosocial barriers  to participate in program The patient should benefit from training in stress management and relaxation.;Psychosocial barriers identified (see note)      Screening Interventions   Interventions Encouraged to exercise;To provide support and resources with identified psychosocial needs;Provide feedback about the scores to participant    Expected Outcomes Long Term Goal: Stressors or current issues are controlled or eliminated.;Short Term goal: Identification and review with participant of any Quality of Life or Depression concerns found by scoring the questionnaire.;Long Term goal: The participant improves quality of Life and PHQ9 Scores as seen by post scores and/or verbalization of changes             Quality of Life Scores:  Quality of Life - 03/10/23 1222       Quality of Life   Select Quality of Life      Quality of Life Scores   Health/Function Pre 8.53 %    Socioeconomic Pre 27.5 %    Psych/Spiritual Pre 12 %    Family Pre 24 %    GLOBAL Pre 15.53 %            Scores of 19 and below usually indicate a poorer quality of life in these areas.  A difference of  2-3 points is a clinically meaningful difference.  A difference of 2-3 points in the total score of the Quality of Life Index has been associated with significant improvement in overall quality of life, self-image, physical symptoms, and general health in studies assessing change in quality of life.  PHQ-9: Review Flowsheet  More data exists      03/10/2023 12/30/2021 10/26/2021 10/15/2021 10/12/2018  Depression screen PHQ 2/9  Decreased Interest 3 3 3 1 2   Down, Depressed, Hopeless 2 1 3  0 1  PHQ - 2 Score 5 4 6 1 3   Altered sleeping 2 3 1  - 1  Tired, decreased energy 3 3 3  - 1  Change in appetite 2 1 0 - 0  Feeling bad or failure about yourself  3 3 3  - 1  Trouble concentrating 2 1 2  - 1  Moving slowly or fidgety/restless 2 0 1 - 0  Suicidal thoughts 2 0 0 - 0  PHQ-9 Score 21 15 16  - 7  Difficult doing  work/chores Somewhat difficult Very difficult Very difficult - Somewhat difficult   Interpretation of Total Score  Total Score Depression Severity:  1-4 = Minimal depression, 5-9 = Mild depression, 10-14 = Moderate depression, 15-19 = Moderately severe depression, 20-27 = Severe depression   Psychosocial Evaluation and Intervention:   Psychosocial Re-Evaluation:  Psychosocial Re-Evaluation     Row Name 03/14/23 1414 03/17/23 0804 03/31/23 1526 05/24/23 0841 06/21/23 1555     Psychosocial Re-Evaluation   Current issues with Current Anxiety/Panic;Current Stress Concerns;Current Depression;Current Sleep Concerns;History of Depression;Current Psychotropic Meds Current Anxiety/Panic;Current Stress Concerns;Current Depression;Current Sleep Concerns;History of Depression;Current Psychotropic Meds Current Anxiety/Panic;Current Stress Concerns;Current Depression;Current Sleep Concerns;History of Depression;Current Psychotropic Meds Current Anxiety/Panic;Current Stress Concerns;Current Depression;Current Sleep Concerns;History of Depression;Current Psychotropic Meds Current Anxiety/Panic;Current Stress Concerns;Current Depression;Current Sleep Concerns;History of Depression;Current Psychotropic Meds   Comments Will review quality of life in the upcoming week. Jaycie denied having any increased concerns or stressors during exercise at cardiac rehab. Quality of life questionnaire reviewed.Yamilka says that she is very dissatisfied with her health due to her various health conditions. Joniya says that she has chest pain daily with exertional activities.Reylynn says her chest pain has improved since she had her PCI in October. Carmella continues to have low energy. Dia says she is limited to what she can do at home. Mikaelyn says she relies on her husband to drive her to her various appointments and to the grocery store. Derisha says that the antidepressant she is taking is controlling her ongoing depression. Raeleen  has not voiced any increased concerns or psychological stressors at cardiac rehab. Chrysta has not voiced any increased concerns or psychological stressors at cardiac rehab. Haisley has not voiced any increased concerns or psychological stressors at cardiac rehab. Yolanda said she will talk with Dr Vela Gerhard about getting counsellling   Expected Outcomes -- Cartha will have controlled or decreased depression/ stressors upon completion of cardiac rehab. Keyla will have controlled or decreased depression/ stressors upon completion of cardiac rehab. Amariah will have controlled or decreased depression/ stressors upon completion of cardiac rehab. Kinza will have controlled or decreased depression/ stressors upon completion of cardiac rehab.   Interventions Stress management education;Relaxation education;Encouraged to attend Cardiac Rehabilitation for the exercise Stress management education;Relaxation education;Encouraged to attend Cardiac Rehabilitation for the exercise Stress management education;Relaxation education;Encouraged to attend Cardiac Rehabilitation for the exercise Stress management education;Relaxation education;Encouraged to attend Cardiac Rehabilitation for the exercise Stress management education;Relaxation education;Encouraged to attend Cardiac Rehabilitation for the exercise   Continue Psychosocial Services  Follow up required by staff Follow up required by staff Follow up required by staff Follow up required by staff Follow up required by staff     Initial Review   Source of Stress Concerns Chronic Illness;Unable to perform yard/household activities;Unable to participate in former interests or hobbies Chronic Illness;Unable to perform yard/household activities;Unable to participate in former interests or hobbies Chronic Illness;Unable to perform yard/household activities;Unable to participate in former interests or hobbies Chronic Illness;Unable to perform yard/household activities;Unable to  participate in former interests or hobbies Chronic Illness;Unable to perform yard/household activities;Unable to participate in former interests or hobbies  Comments Will contoinue to monitor and offer support as needed. Will contoinue to monitor and offer support as needed. Will contoinue to monitor and offer support as needed. Will contoinue to monitor and offer support as needed. Will contoinue to monitor and offer support as needed.            Psychosocial Discharge (Final Psychosocial Re-Evaluation):  Psychosocial Re-Evaluation - 06/21/23 1555       Psychosocial Re-Evaluation   Current issues with Current Anxiety/Panic;Current Stress Concerns;Current Depression;Current Sleep Concerns;History of Depression;Current Psychotropic Meds    Comments Jere has not voiced any increased concerns or psychological stressors at cardiac rehab. Sharaine said she will talk with Dr Vela Gerhard about getting counsellling    Expected Outcomes Hiilani will have controlled or decreased depression/ stressors upon completion of cardiac rehab.    Interventions Stress management education;Relaxation education;Encouraged to attend Cardiac Rehabilitation for the exercise    Continue Psychosocial Services  Follow up required by staff      Initial Review   Source of Stress Concerns Chronic Illness;Unable to perform yard/household activities;Unable to participate in former interests or hobbies    Comments Will contoinue to monitor and offer support as needed.             Vocational Rehabilitation: Provide vocational rehab assistance to qualifying candidates.   Vocational Rehab Evaluation & Intervention:  Vocational Rehab - 03/10/23 1137       Initial Vocational Rehab Evaluation & Intervention   Assessment shows need for Vocational Rehabilitation No   Evelean is semi retired and does not need vocational rehab at this time            Education: Education Goals: Education classes will be provided on a  weekly basis, covering required topics. Participant will state understanding/return demonstration of topics presented.    Education     Row Name 03/14/23 1400     Education   Cardiac Education Topics [facility]   Select Workshops     Workshops   Educator Exercise Physiologist   Select Exercise   Exercise Workshop Location manager and Fall Prevention   Instruction Review Code 1- Verbalizes Understanding   Class Start Time 1142   Class Stop Time 1224   Class Time Calculation (min) 42 min    Row Name 03/16/23 1400     Education   Cardiac Education Topics [facility]   Customer service manager   Weekly Topic Adding Flavor - Sodium-Free   Instruction Review Code 1- Verbalizes Understanding   Class Start Time 1145   Class Stop Time 1225   Class Time Calculation (min) 40 min    Row Name 03/21/23 1300     Education   Cardiac Education Topics [facility]   Nurse, children's Exercise Physiologist   Select Psychosocial   Psychosocial Healthy Minds, Bodies, Hearts   Instruction Review Code 1- Verbalizes Understanding   Class Start Time 1155   Class Stop Time 1240   Class Time Calculation (min) 45 min    Row Name 03/23/23 1200     Education   Cardiac Education Topics [facility]   Set designer Exercise Physiologist   Weekly Topic Fast and Healthy Breakfasts   Instruction Review Code 1- Verbalizes Understanding   Class Start Time 1143   Class Stop Time 1215   Class Time Calculation (min) 32 min  Row Name 03/28/23 1300     Education   Cardiac Education Topics [facility]   Select Core Videos     Core Videos   Educator Nurse   Select Nutrition   Nutrition Becoming a [facility]in Chef   Instruction Review Code 1- Verbalizes Understanding   Class Start Time 1150   Class Stop Time 1226   Class Time Calculation (min) 36 min    Row Name 03/30/23 1300     Education    Cardiac Education Topics [facility]   Secondary school teacher School   Educator Dietitian;Nurse   Weekly Topic Personalizing Your Pritikin Plate   Instruction Review Code 1- Verbalizes Understanding   Class Start Time 1149   Class Stop Time 1233   Class Time Calculation (min) 44 min    Row Name 04/04/23 1600     Education   Cardiac Education Topics [facility]   Select Workshops     Workshops   Educator Exercise Physiologist   Select Psychosocial   Psychosocial Workshop Recognizing and Reducing Stress   Instruction Review Code 1- Verbalizes Understanding   Class Start Time 1145   Class Stop Time 1228   Class Time Calculation (min) 43 min    Row Name 04/06/23 1400     Education   Cardiac Education Topics [facility]   Customer service manager   Weekly Topic [employer] Desserts   Instruction Review Code 1- Verbalizes Understanding   Class Start Time 1145   Class Stop Time 1222   Class Time Calculation (min) 37 min    Row Name 04/11/23 1300     Education   Cardiac Education Topics [facility]   Geographical information systems officer Exercise   Exercise Workshop Exercise Basics: Diplomatic Services operational officer   Instruction Review Code 1- Verbalizes Understanding   Class Start Time 1146   Class Stop Time 1235   Class Time Calculation (min) 49 min    Row Name 04/13/23 1600     Education   Cardiac Education Topics [facility]   Customer service manager   Weekly Topic Efficiency Cooking - Meals in a Snap   Instruction Review Code 1- Verbalizes Understanding   Class Start Time 1145   Class Stop Time 1225   Class Time Calculation (min) 40 min    Row Name 04/18/23 1500     Education   Cardiac Education Topics [facility]   Glass blower/designer Nutrition   Nutrition Workshop Targeting Your Nutrition Priorities    Instruction Review Code 1- Verbalizes Understanding   Class Start Time 1150   Class Stop Time 1222   Class Time Calculation (min) 32 min    Row Name 04/20/23 1500     Education   Cardiac Education Topics [facility]   Orthoptist   Educator Dietitian   Weekly Topic One-Pot Wonders   Instruction Review Code 1- Verbalizes Understanding   Class Start Time 1145   Class Stop Time 1227   Class Time Calculation (min) 42 min    Row Name 05/04/23 1200     Education   Cardiac Education Topics [facility]in   Customer service manager   Weekly Topic  Fast Evening Meals   Instruction Review Code 1- Verbalizes Understanding   Class Start Time 1145   Class Stop Time 1225   Class Time Calculation (min) 40 min    Row Name 05/09/23 1500     Education   Cardiac Education Topics [facility]   Glass blower/designer Nutrition   Nutrition Workshop Fueling a Forensic psychologist   Instruction Review Code 1- TEFL teacher Understanding   Class Start Time 1145   Class Stop Time 1230   Class Time Calculation (min) 45 min    Row Name 05/11/23 1500     Education   Cardiac Education Topics [facility]   Customer service manager   Weekly Topic International Cuisine- Spotlight on the [employer] Zones   Instruction Review Code 1- Verbalizes Understanding   Class Start Time 1145   Class Stop Time 1225   Class Time Calculation (min) 40 min    Row Name 05/16/23 1600     Education   Cardiac Education Topics [facility]in   Select Workshops     Workshops   Educator Exercise Physiologist   Select Psychosocial   Psychosocial Workshop Healthy Sleep for a Healthy Heart   Instruction Review Code 1- Verbalizes Understanding   Class Start Time 1148   Class Stop Time 1234   Class Time Calculation (min) 46 min    Row Name 05/23/23 1300     Education   Cardiac Education Topics  [facility]in   Select Workshops     Workshops   Educator Exercise Physiologist   Select Exercise   Exercise Workshop Managing Heart Disease: Your Path to a Healthier Heart   Instruction Review Code 1- Verbalizes Understanding   Class Start Time 1145   Class Stop Time 1237   Class Time Calculation (min) 52 min    Row Name 05/25/23 1300     Education   Cardiac Education Topics [facility]in   Customer service manager   Weekly Topic Powerhouse Plant-Based Proteins   Instruction Review Code 1- Verbalizes Understanding   Class Start Time 1145   Class Stop Time 1222   Class Time Calculation (min) 37 min    Row Name 05/30/23 1300     Education   Cardiac Education Topics [facility]in   Geographical information systems officer Psychosocial   Psychosocial Workshop From Head to Heart: The Power of a Healthy Outlook   Instruction Review Code 1- Verbalizes Understanding   Class Start Time 1147   Class Stop Time 1234   Class Time Calculation (min) 47 min    Row Name 06/01/23 1600     Education   Cardiac Education Topics [facility]in   Customer service manager   Weekly Topic Tasty Appetizers and Snacks   Instruction Review Code 1- Verbalizes Understanding   Class Start Time 1145   Class Stop Time 1225   Class Time Calculation (min) 40 min    Row Name 06/06/23 1100     Education   Cardiac Education Topics [facility]in   Psychologist, forensic Psychosocial   Psychosocial Healthy Minds, Bodies, Hearts   Instruction Review Code 1- Verbalizes Understanding   Class Start Time 1147  Class Stop Time 1221   Class Time Calculation (min) 34 min    Row Name 06/08/23 1200     Education   Cardiac Education Topics [facility]in   Customer service manager   Weekly Topic Adding Flavor - Sodium-Free    Instruction Review Code 1- Verbalizes Understanding   Class Start Time 1145   Class Stop Time 1221   Class Time Calculation (min) 36 min    Row Name 06/20/23 1100     Education   Cardiac Education Topics [facility]in   Hospital doctor Education   General Education Metabolic Syndrome and Belly Fat   Instruction Review Code 1- Verbalizes Understanding   Class Start Time 1150   Class Stop Time 1230   Class Time Calculation (min) 40 min            Core Videos: Exercise    Move It!  Clinical staff conducted group or individual video education with verbal and written material and guidebook.  Patient learns the recommended [facility] exercise program. Exercise with the goal of living a long, healthy life. Some of the health benefits of exercise include controlled diabetes, healthier blood pressure levels, improved cholesterol levels, improved heart and lung capacity, improved sleep, and better body composition. Everyone should speak with their doctor before starting or changing an exercise routine.  Biomechanical Limitations Clinical staff conducted group or individual video education with verbal and written material and guidebook.  Patient learns how biomechanical limitations can impact exercise and how we can mitigate and possibly overcome limitations to have an impactful and balanced exercise routine.  Body Composition Clinical staff conducted group or individual video education with verbal and written material and guidebook.  Patient learns that body composition (ratio of muscle mass to fat mass) is a key component to assessing overall fitness, rather than body weight alone. Increased fat mass, especially visceral belly fat, can put us  at increased risk for metabolic syndrome, type 2 diabetes, heart disease, and even death. It is recommended to combine diet and exercise (cardiovascular and resistance training) to  improve your body composition. Seek guidance from your physician and exercise physiologist before implementing an exercise routine.  Exercise Action Plan Clinical staff conducted group or individual video education with verbal and written material and guidebook.  Patient learns the recommended strategies to achieve and enjoy long-term exercise adherence, including variety, self-motivation, self-efficacy, and positive decision making. Benefits of exercise include fitness, good health, weight management, more energy, better sleep, less stress, and overall well-being.  Medical   Heart Disease Risk Reduction Clinical staff conducted group or individual video education with verbal and written material and guidebook.  Patient learns our heart is our most vital organ as it circulates oxygen , nutrients, white blood cells, and hormones throughout the entire body, and carries waste away. Data supports a plant-based eating plan like the [facility]ritikin Program for its effectiveness in slowing progression of and reversing heart disease. The video provides a number of recommendations to address heart disease.   Metabolic Syndrome and Belly Fat  Clinical staff conducted group or individual video education with verbal and written material and guidebook.  Patient learns what metabolic syndrome is, how it leads to heart disease, and how one can reverse it and keep it from coming back. You have metabolic syndrome if you have 3 of the following 5 criteria: abdominal obesity, high  blood pressure, high triglycerides, low HDL cholesterol, and high blood sugar.  Hypertension and Heart Disease Clinical staff conducted group or individual video education with verbal and written material and guidebook.  Patient learns that high blood pressure, or hypertension, is very common in the [place] . Hypertension is largely due to excessive salt intake, but other important risk factors include being overweight, physical inactivity,  drinking too much alcohol , smoking, and not eating enough potassium from fruits and vegetables. High blood pressure is a leading risk factor for heart attack, stroke, congestive heart failure, dementia, kidney failure, and premature death. Long-term effects of excessive salt intake include stiffening of the arteries and thickening of heart muscle and organ damage. Recommendations include ways to reduce hypertension and the risk of heart disease.  Diseases of Our Time - Focusing on Diabetes Clinical staff conducted group or individual video education with verbal and written material and guidebook.  Patient learns why the best way to stop diseases of our time is prevention, through food and other lifestyle changes. Medicine (such as prescription pills and surgeries) is often only a Band-Aid on the problem, not a long-term solution. Most common diseases of our time include obesity, type 2 diabetes, hypertension, heart disease, and cancer. The Pritikin Program is recommended and has been proven to help reduce, reverse, and/or prevent the damaging effects of metabolic syndrome.  Nutrition   Overview of the Pritikin Eating Plan  Clinical staff conducted group or individual video education with verbal and written material and guidebook.  Patient learns about the Pritikin Eating Plan for disease risk reduction. The Pritikin Eating Plan emphasizes a wide variety of unrefined, minimally-processed carbohydrates, like fruits, vegetables, whole grains, and legumes. Go, Caution, and Stop food choices are explained. Plant-based and lean animal proteins are emphasized. Rationale provided for low sodium intake for blood pressure control, low added sugars for blood sugar stabilization, and low added fats and oils for coronary artery disease risk reduction and weight management.  Calorie Density  Clinical staff conducted group or individual video education with verbal and written material and guidebook.  Patient learns  about calorie density and how it impacts the Pritikin Eating Plan. Knowing the characteristics of the food you choose will help you decide whether those foods will lead to weight gain or weight loss, and whether you want to consume more or less of them. Weight loss is usually a side effect of the Pritikin Eating Plan because of its focus on low calorie-dense foods.  Label Reading  Clinical staff conducted group or individual video education with verbal and written material and guidebook.  Patient learns about the Pritikin recommended label reading guidelines and corresponding recommendations regarding calorie density, added sugars, sodium content, and whole grains.  Dining Out - Part 1  Clinical staff conducted group or individual video education with verbal and written material and guidebook.  Patient learns that restaurant meals can be sabotaging because they can be so high in calories, fat, sodium, and/or sugar. Patient learns recommended strategies on how to positively address this and avoid unhealthy pitfalls.  Facts on Fats  Clinical staff conducted group or individual video education with verbal and written material and guidebook.  Patient learns that lifestyle modifications can be just as effective, if not more so, as many medications for lowering your risk of heart disease. A Pritikin lifestyle can help to reduce your risk of inflammation and atherosclerosis (cholesterol build-up, or plaque, in the artery walls). Lifestyle interventions such as dietary choices and physical activity address  the cause of atherosclerosis. A review of the types of fats and their impact on blood cholesterol levels, along with dietary recommendations to reduce fat intake is also included.  Nutrition Action Plan  Clinical staff conducted group or individual video education with verbal and written material and guidebook.  Patient learns how to incorporate Pritikin recommendations into their lifestyle.  Recommendations include planning and keeping personal health goals in mind as an important part of their success.  Healthy Mind-Set    Healthy Minds, Bodies, Hearts  Clinical staff conducted group or individual video education with verbal and written material and guidebook.  Patient learns how to identify when they are stressed. Video will discuss the impact of that stress, as well as the many benefits of stress management. Patient will also be introduced to stress management techniques. The way we think, act, and feel has an impact on our hearts.  How Our Thoughts Can Heal Our Hearts  Clinical staff conducted group or individual video education with verbal and written material and guidebook.  Patient learns that negative thoughts can cause depression and anxiety. This can result in negative lifestyle behavior and serious health problems. Cognitive behavioral therapy is an effective method to help control our thoughts in order to change and improve our emotional outlook.  Additional Videos:  Exercise    Improving Performance  Clinical staff conducted group or individual video education with verbal and written material and guidebook.  Patient learns to use a non-linear approach by alternating intensity levels and lengths of time spent exercising to help burn more calories and lose more body fat. Cardiovascular exercise helps improve heart health, metabolism, hormonal balance, blood sugar control, and recovery from fatigue. Resistance training improves strength, endurance, balance, coordination, reaction time, metabolism, and muscle mass. Flexibility exercise improves circulation, posture, and balance. Seek guidance from your physician and exercise physiologist before implementing an exercise routine and learn your capabilities and proper form for all exercise.  Introduction to Yoga  Clinical staff conducted group or individual video education with verbal and written material and guidebook.   Patient learns about yoga, a discipline of the coming together of mind, breath, and body. The benefits of yoga include improved flexibility, improved range of motion, better posture and core strength, increased lung function, weight loss, and positive self-image. Yoga's heart health benefits include lowered blood pressure, healthier heart rate, decreased cholesterol and triglyceride levels, improved immune function, and reduced stress. Seek guidance from your physician and exercise physiologist before implementing an exercise routine and learn your capabilities and proper form for all exercise.  Medical   Aging: Enhancing Your Quality of Life  Clinical staff conducted group or individual video education with verbal and written material and guidebook.  Patient learns key strategies and recommendations to stay in good physical health and enhance quality of life, such as prevention strategies, having an advocate, securing a Health Care Proxy and Power of Attorney, and keeping a list of medications and system for tracking them. It also discusses how to avoid risk for bone loss.  Biology of Weight Control  Clinical staff conducted group or individual video education with verbal and written material and guidebook.  Patient learns that weight gain occurs because we consume more calories than we burn (eating more, moving less). Even if your body weight is normal, you may have higher ratios of fat compared to muscle mass. Too much body fat puts you at increased risk for cardiovascular disease, heart attack, stroke, type 2 diabetes, and obesity-related cancers. In  addition to exercise, following the Pritikin Eating Plan can help reduce your risk.  Decoding Lab Results  Clinical staff conducted group or individual video education with verbal and written material and guidebook.  Patient learns that lab test reflects one measurement whose values change over time and are influenced by many factors, including  medication, stress, sleep, exercise, food, hydration, pre-existing medical conditions, and more. It is recommended to use the knowledge from this video to become more involved with your lab results and evaluate your numbers to speak with your doctor.   Diseases of Our Time - Overview  Clinical staff conducted group or individual video education with verbal and written material and guidebook.  Patient learns that according to the CDC, 50% to 70% of chronic diseases (such as obesity, type 2 diabetes, elevated lipids, hypertension, and heart disease) are avoidable through lifestyle improvements including healthier food choices, listening to satiety cues, and increased physical activity.  Sleep Disorders Clinical staff conducted group or individual video education with verbal and written material and guidebook.  Patient learns how good quality and duration of sleep are important to overall health and well-being. Patient also learns about sleep disorders and how they impact health along with recommendations to address them, including discussing with a physician.  Nutrition  Dining Out - Part 2 Clinical staff conducted group or individual video education with verbal and written material and guidebook.  Patient learns how to plan ahead and communicate in order to maximize their dining experience in a healthy and nutritious manner. Included are recommended food choices based on the type of restaurant the patient is visiting.   Fueling a Banker conducted group or individual video education with verbal and written material and guidebook.  There is a strong connection between our food choices and our health. Diseases like obesity and type 2 diabetes are very prevalent and are in large-part due to lifestyle choices. The Pritikin Eating Plan provides plenty of food and hunger-curbing satisfaction. It is easy to follow, affordable, and helps reduce health risks.  Menu Workshop  Clinical  staff conducted group or individual video education with verbal and written material and guidebook.  Patient learns that restaurant meals can sabotage health goals because they are often packed with calories, fat, sodium, and sugar. Recommendations include strategies to plan ahead and to communicate with the manager, chef, or server to help order a healthier meal.  Planning Your Eating Strategy  Clinical staff conducted group or individual video education with verbal and written material and guidebook.  Patient learns about the Pritikin Eating Plan and its benefit of reducing the risk of disease. The Pritikin Eating Plan does not focus on calories. Instead, it emphasizes high-quality, nutrient-rich foods. By knowing the characteristics of the foods, we choose, we can determine their calorie density and make informed decisions.  Targeting Your Nutrition Priorities  Clinical staff conducted group or individual video education with verbal and written material and guidebook.  Patient learns that lifestyle habits have a tremendous impact on disease risk and progression. This video provides eating and physical activity recommendations based on your personal health goals, such as reducing LDL cholesterol, losing weight, preventing or controlling type 2 diabetes, and reducing high blood pressure.  Vitamins and Minerals  Clinical staff conducted group or individual video education with verbal and written material and guidebook.  Patient learns different ways to obtain key vitamins and minerals, including through a recommended healthy diet. It is important to discuss all supplements you take  with your doctor.   Healthy Mind-Set    Smoking Cessation  Clinical staff conducted group or individual video education with verbal and written material and guidebook.  Patient learns that cigarette smoking and tobacco addiction pose a serious health risk which affects millions of people. Stopping smoking will  significantly reduce the risk of heart disease, lung disease, and many forms of cancer. Recommended strategies for quitting are covered, including working with your doctor to develop a successful plan.  Culinary   Becoming a Set designer conducted group or individual video education with verbal and written material and guidebook.  Patient learns that cooking at home can be healthy, cost-effective, quick, and puts them in control. Keys to cooking healthy recipes will include looking at your recipe, assessing your equipment needs, planning ahead, making it simple, choosing cost-effective seasonal ingredients, and limiting the use of added fats, salts, and sugars.  Cooking - Breakfast and Snacks  Clinical staff conducted group or individual video education with verbal and written material and guidebook.  Patient learns how important breakfast is to satiety and nutrition through the entire day. Recommendations include key foods to eat during breakfast to help stabilize blood sugar levels and to prevent overeating at meals later in the day. Planning ahead is also a key component.  Cooking - Educational psychologist conducted group or individual video education with verbal and written material and guidebook.  Patient learns eating strategies to improve overall health, including an approach to cook more at home. Recommendations include thinking of animal protein as a side on your plate rather than center stage and focusing instead on lower calorie dense options like vegetables, fruits, whole grains, and plant-based proteins, such as beans. Making sauces in large quantities to freeze for later and leaving the skin on your vegetables are also recommended to maximize your experience.  Cooking - Healthy Salads and Dressing Clinical staff conducted group or individual video education with verbal and written material and guidebook.  Patient learns that vegetables, fruits, whole grains,  and legumes are the foundations of the Pritikin Eating Plan. Recommendations include how to incorporate each of these in flavorful and healthy salads, and how to create homemade salad dressings. Proper handling of ingredients is also covered. Cooking - Soups and [employer] - Soups and Desserts Clinical staff conducted group or individual video education with verbal and written material and guidebook.  Patient learns that Pritikin soups and desserts make for easy, nutritious, and delicious snacks and meal components that are low in sodium, fat, sugar, and calorie density, while high in vitamins, minerals, and filling fiber. Recommendations include simple and healthy ideas for soups and desserts.   Overview     The Pritikin Solution Program Overview Clinical staff conducted group or individual video education with verbal and written material and guidebook.  Patient learns that the results of the Pritikin Program have been documented in more than 100 articles published in peer-reviewed journals, and the benefits include reducing risk factors for (and, in some cases, even reversing) high cholesterol, high blood pressure, type 2 diabetes, obesity, and more! An overview of the three key pillars of the Pritikin Program will be covered: eating well, doing regular exercise, and having a healthy mind-set.  WORKSHOPS  Exercise: Exercise Basics: Building Your Action Plan Clinical staff led group instruction and group discussion with PowerPoint presentation and patient guidebook. To enhance the learning environment the use of posters, models and videos may be added. At the  conclusion of this workshop, patients will comprehend the difference between physical activity and exercise, as well as the benefits of incorporating both, into their routine. Patients will understand the FITT (Frequency, Intensity, Time, and Type) principle and how to use it to build an exercise action plan. In addition, safety  concerns and other considerations for exercise and cardiac rehab will be addressed by the presenter. The purpose of this lesson is to promote a comprehensive and effective weekly exercise routine in order to improve patients' overall level of fitness.   Managing Heart Disease: Your Path to a Healthier Heart Clinical staff led group instruction and group discussion with PowerPoint presentation and patient guidebook. To enhance the learning environment the use of posters, models and videos may be added.At the conclusion of this workshop, patients will understand the anatomy and physiology of the heart. Additionally, they will understand how Pritikin's three pillars impact the risk factors, the progression, and the management of heart disease.  The purpose of this lesson is to provide a high-level overview of the heart, heart disease, and how the Pritikin lifestyle positively impacts risk factors.  Exercise Biomechanics Clinical staff led group instruction and group discussion with PowerPoint presentation and patient guidebook. To enhance the learning environment the use of posters, models and videos may be added. Patients will learn how the structural parts of their bodies function and how these functions impact their daily activities, movement, and exercise. Patients will learn how to promote a neutral spine, learn how to manage pain, and identify ways to improve their physical movement in order to promote healthy living. The purpose of this lesson is to expose patients to common physical limitations that impact physical activity. Participants will learn practical ways to adapt and manage aches and pains, and to minimize their effect on regular exercise. Patients will learn how to maintain good posture while sitting, walking, and lifting.  Balance Training and Fall Prevention  Clinical staff led group instruction and group discussion with PowerPoint presentation and patient guidebook. To  enhance the learning environment the use of posters, models and videos may be added. At the conclusion of this workshop, patients will understand the importance of their sensorimotor skills (vision, proprioception, and the vestibular system) in maintaining their ability to balance as they age. Patients will apply a variety of balancing exercises that are appropriate for their current level of function. Patients will understand the common causes for poor balance, possible solutions to these problems, and ways to modify their physical environment in order to minimize their fall risk. The purpose of this lesson is to teach patients about the importance of maintaining balance as they age and ways to minimize their risk of falling.  WORKSHOPS   Nutrition:  Fueling a Ship broker led group instruction and group discussion with PowerPoint presentation and patient guidebook. To enhance the learning environment the use of posters, models and videos may be added. Patients will review the foundational principles of the Pritikin Eating Plan and understand what constitutes a serving size in each of the food groups. Patients will also learn Pritikin-friendly foods that are better choices when away from home and review make-ahead meal and snack options. Calorie density will be reviewed and applied to three nutrition priorities: weight maintenance, weight loss, and weight gain. The purpose of this lesson is to reinforce (in a group setting) the key concepts around what patients are recommended to eat and how to apply these guidelines when away from home by planning and selecting Pritikin-friendly  options. Patients will understand how calorie density may be adjusted for different weight management goals.  Mindful Eating  Clinical staff led group instruction and group discussion with PowerPoint presentation and patient guidebook. To enhance the learning environment the use of posters, models and videos may  be added. Patients will briefly review the concepts of the Pritikin Eating Plan and the importance of low-calorie dense foods. The concept of mindful eating will be introduced as well as the importance of paying attention to internal hunger signals. Triggers for non-hunger eating and techniques for dealing with triggers will be explored. The purpose of this lesson is to provide patients with the opportunity to review the basic principles of the Pritikin Eating Plan, discuss the value of eating mindfully and how to measure internal cues of hunger and fullness using the Hunger Scale. Patients will also discuss reasons for non-hunger eating and learn strategies to use for controlling emotional eating.  Targeting Your Nutrition Priorities Clinical staff led group instruction and group discussion with PowerPoint presentation and patient guidebook. To enhance the learning environment the use of posters, models and videos may be added. Patients will learn how to determine their genetic susceptibility to disease by reviewing their family history. Patients will gain insight into the importance of diet as part of an overall healthy lifestyle in mitigating the impact of genetics and other environmental insults. The purpose of this lesson is to provide patients with the opportunity to assess their personal nutrition priorities by looking at their family history, their own health history and current risk factors. Patients will also be able to discuss ways of prioritizing and modifying the Pritikin Eating Plan for their highest risk areas  Menu  Clinical staff led group instruction and group discussion with PowerPoint presentation and patient guidebook. To enhance the learning environment the use of posters, models and videos may be added. Using menus brought in from [employer], or printed from [employer], patients will apply the Pritikin dining out guidelines that were presented in the [employer] video. Patients will also be able to practice these guidelines in a variety of provided scenarios. The purpose of this lesson is to provide patients with the opportunity to practice hands-on learning of the Pritikin Dining Out guidelines with actual menus and practice scenarios.  Label Reading Clinical staff led group instruction and group discussion with PowerPoint presentation and patient guidebook. To enhance the learning environment the use of posters, models and videos may be added. Patients will review and discuss the Pritikin label reading guidelines presented in Pritikin's Label Reading Educational series video. Using fool labels brought in from local grocery stores and markets, patients will apply the label reading guidelines and determine if the packaged food meet the Pritikin guidelines. The purpose of this lesson is to provide patients with the opportunity to review, discuss, and practice hands-on learning of the Pritikin Label Reading guidelines with actual packaged food labels. Cooking School  Pritikin's [employer] are designed to teach patients ways to prepare quick, simple, and affordable recipes at home. The importance of nutrition's role in chronic disease risk reduction is reflected in its emphasis in the overall Pritikin program. By learning how to prepare essential core Pritikin Eating Plan recipes, patients will increase control over what they eat; be able to customize the flavor of foods without the use of added salt, sugar, or fat; and improve the quality of the food they consume. By learning a set of core recipes which are easily assembled,  quickly prepared, and affordable, patients are more likely to prepare more healthy foods at home. These workshops focus on convenient breakfasts, simple entres, side dishes, and desserts which can be prepared with minimal effort and are consistent with nutrition recommendations for cardiovascular risk reduction. Cooking  [employer] are taught by a Armed forces logistics/support/administrative officer (RD) who has been trained by the [employer]. The chef or RD has a clear understanding of the importance of minimizing - if not completely eliminating - added fat, sugar, and sodium in recipes. Throughout the series of Cooking School Workshop sessions, patients will learn about healthy ingredients and efficient methods of cooking to build confidence in their capability to prepare    Cooking School weekly topics:  Adding Flavor- Sodium-Free  Fast and Healthy Breakfasts  Powerhouse Plant-Based Proteins  Satisfying Salads and Dressings  Simple Sides and Sauces  International Cuisine-Spotlight on the [employer] Zones  Delicious Desserts  Savory Soups  [employer] - Meals in a Astronomer Appetizers and Snacks  Comforting Weekend Breakfasts  One-Pot Wonders   Fast Evening Meals  Landscape architect Your Pritikin Plate  WORKSHOPS   Healthy Mindset (Psychosocial):  Focused Goals, Sustainable Changes Clinical staff led group instruction and group discussion with PowerPoint presentation and patient guidebook. To enhance the learning environment the use of posters, models and videos may be added. Patients will be able to apply effective goal setting strategies to establish at least one personal goal, and then take consistent, meaningful action toward that goal. They will learn to identify common barriers to achieving personal goals and develop strategies to overcome them. Patients will also gain an understanding of how our mind-set can impact our ability to achieve goals and the importance of cultivating a positive and growth-oriented mind-set. The purpose of this lesson is to provide patients with a deeper understanding of how to set and achieve personal goals, as well as the tools and strategies needed to overcome common obstacles which may arise along the way.  From Head to Heart: The Power of a Healthy  Outlook  Clinical staff led group instruction and group discussion with PowerPoint presentation and patient guidebook. To enhance the learning environment the use of posters, models and videos may be added. Patients will be able to recognize and describe the impact of emotions and mood on physical health. They will discover the importance of self-care and explore self-care practices which may work for them. Patients will also learn how to utilize the 4 C's to cultivate a healthier outlook and better manage stress and challenges. The purpose of this lesson is to demonstrate to patients how a healthy outlook is an essential part of maintaining good health, especially as they continue their cardiac rehab journey.  Healthy Sleep for a Healthy Heart Clinical staff led group instruction and group discussion with PowerPoint presentation and patient guidebook. To enhance the learning environment the use of posters, models and videos may be added. At the conclusion of this workshop, patients will be able to demonstrate knowledge of the importance of sleep to overall health, well-being, and quality of life. They will understand the symptoms of, and treatments for, common sleep disorders. Patients will also be able to identify daytime and nighttime behaviors which impact sleep, and they will be able to apply these tools to help manage sleep-related challenges. The purpose of this lesson is to provide patients with a general overview of sleep and outline the importance of quality sleep. Patients will learn about  a few of the most common sleep disorders. Patients will also be introduced to the concept of "sleep hygiene," and discover ways to self-manage certain sleeping problems through simple daily behavior changes. Finally, the workshop will motivate patients by clarifying the links between quality sleep and their goals of heart-healthy living.   Recognizing and Reducing Stress Clinical staff led group instruction and  group discussion with PowerPoint presentation and patient guidebook. To enhance the learning environment the use of posters, models and videos may be added. At the conclusion of this workshop, patients will be able to understand the types of stress reactions, differentiate between acute and chronic stress, and recognize the impact that chronic stress has on their health. They will also be able to apply different coping mechanisms, such as reframing negative self-talk. Patients will have the opportunity to practice a variety of stress management techniques, such as deep abdominal breathing, progressive muscle relaxation, and/or guided imagery.  The purpose of this lesson is to educate patients on the role of stress in their lives and to provide healthy techniques for coping with it.  Learning Barriers/Preferences:  Learning Barriers/Preferences - 03/10/23 1226       Learning Barriers/Preferences   Learning Barriers Exercise Concerns;Sight   Pt wears reading glasses, dizzy spells, poor balance, assistive device            Education Topics:  Knowledge Questionnaire Score:  Knowledge Questionnaire Score - 03/10/23 1227       Knowledge Questionnaire Score   Pre Score 21/24             Core Components/Risk Factors/Patient Goals at Admission:  Personal Goals and Risk Factors at Admission - 03/10/23 1228       Core Components/Risk Factors/Patient Goals on Admission    Weight Management Yes;Obesity;Weight Loss    Intervention Weight Management: Develop a combined nutrition and exercise program designed to reach desired caloric intake, while maintaining appropriate intake of nutrient and fiber, sodium and fats, and appropriate energy expenditure required for the weight goal.;Weight Management: Provide education and appropriate resources to help participant work on and attain dietary goals.;Weight Management/Obesity: Establish reasonable short term and long term weight goals.;Obesity:  Provide education and appropriate resources to help participant work on and attain dietary goals.    Expected Outcomes Long Term: Adherence to nutrition and physical activity/exercise program aimed toward attainment of established weight goal;Short Term: Continue to assess and modify interventions until short term weight is achieved;Weight Maintenance: Understanding of the daily nutrition guidelines, which includes 25-35% calories from fat, 7% or less cal from saturated fats, less than 200mg  cholesterol, less than 1.5gm of sodium, & 5 or more servings of fruits and vegetables daily;Weight Loss: Understanding of general recommendations for a balanced deficit meal plan, which promotes 1-2 lb weight loss per week and includes a negative energy balance of [phone] kcal/d;Understanding recommendations for meals to include 15-35% energy as protein, 25-35% energy from fat, 35-60% energy from carbohydrates, less than 200mg  of dietary cholesterol, 20-35 gm of total fiber daily;Understanding of distribution of calorie intake throughout the day with the consumption of 4-5 meals/snacks    Diabetes Yes    Intervention Provide education about signs/symptoms and action to take for hypo/hyperglycemia.;Provide education about proper nutrition, including hydration, and aerobic/resistive exercise prescription along with prescribed medications to achieve blood glucose in normal ranges: Fasting glucose 65-99 mg/dL    Expected Outcomes Short Term: Participant verbalizes understanding of the signs/symptoms and immediate care of hyper/hypoglycemia, proper foot care and importance of medication,  aerobic/resistive exercise and nutrition plan for blood glucose control.;Long Term: Attainment of HbA1C < 7%.    Heart Failure Yes    Intervention Provide a combined exercise and nutrition program that is supplemented with education, support and counseling about heart failure. Directed toward relieving symptoms such as shortness of breath,  decreased exercise tolerance, and extremity edema.    Expected Outcomes Improve functional capacity of life;Short term: Attendance in program 2-3 days a week with increased exercise capacity. Reported lower sodium intake. Reported increased fruit and vegetable intake. Reports medication compliance.;Short term: Daily weights obtained and reported for increase. Utilizing diuretic protocols set by physician.;Long term: Adoption of self-care skills and reduction of barriers for early signs and symptoms recognition and intervention leading to self-care maintenance.    Hypertension Yes    Intervention Provide education on lifestyle modifcations including regular physical activity/exercise, weight management, moderate sodium restriction and increased consumption of fresh fruit, vegetables, and low fat dairy, alcohol  moderation, and smoking cessation.;Monitor prescription use compliance.    Expected Outcomes Short Term: Continued assessment and intervention until BP is < 140/62mm HG in hypertensive participants. < 130/40mm HG in hypertensive participants with diabetes, heart failure or chronic kidney disease.;Long Term: Maintenance of blood pressure at goal levels.    Lipids Yes    Intervention Provide education and support for participant on nutrition & aerobic/resistive exercise along with prescribed medications to achieve LDL 70mg , HDL >40mg .    Expected Outcomes Short Term: Participant states understanding of desired cholesterol values and is compliant with medications prescribed. Participant is following exercise prescription and nutrition guidelines.;Long Term: Cholesterol controlled with medications as prescribed, with individualized exercise RX and with personalized nutrition plan. Value goals: LDL < 70mg , HDL > 40 mg.    Personal Goal Other Yes    Personal Goal Gain more independance and increase strength    Intervention Will continue to monitor pt and progress workloads as tolerated without sign or  symptom    Expected Outcomes Pt will achieve her goals             Core Components/Risk Factors/Patient Goals Review:   Goals and Risk Factor Review     Row Name 03/14/23 1416 03/31/23 1528 05/03/23 0949 05/24/23 0844 06/21/23 1556     Core Components/Risk Factors/Patient Goals Review   Personal Goals Review Weight Management/Obesity;Improve shortness of breath with ADL's;Lipids;Heart Failure;Diabetes;Hypertension;Stress Weight Management/Obesity;Improve shortness of breath with ADL's;Lipids;Heart Failure;Diabetes;Hypertension;Stress Weight Management/Obesity;Improve shortness of breath with ADL's;Lipids;Heart Failure;Diabetes;Hypertension;Stress Weight Management/Obesity;Improve shortness of breath with ADL's;Lipids;Heart Failure;Diabetes;Hypertension;Stress Weight Management/Obesity;Improve shortness of breath with ADL's;Lipids;Heart Failure;Diabetes;Hypertension;Stress   Review Keimya started cardiac rehab on 03/14/23. Paiden did fair with exercise. nonsusutained sinus brady. Oxygen  saturations and CBG's stable. Jacqlyn Matas a rollator for stabilty. Danah exercised on oxygen  and was encouraged to take frequent rest breaks. Itzell is deconditioned. Wyona continues to do well with exercise at cardiac rehab for her fitness level.  Oxygen  saturations, vital signs and CBG's have been stable. Sharmon remains decondtioned. Ladonna is enjoying participating in cardiac rehab and says that the program has been helpful for her so far. Ryleah continues to do well with exercise at cardiac rehab for her fitness level.  Oxygen  saturations, vital signs and CBG's remain stable.  Morning has gained 1.7 kg since starting cardiac rehab. Graysen plans to return to exercise on 05/04/23 after being absent with with cold and flu symptoms. Ethal continues to do well with exercise at cardiac rehab for her fitness level.  Oxygen  saturations, vital signs and CBG's remain stable.  Anaira has gained 1.7 kg since starting cardiac  rehab. Novalee will complete cardiac rehab in May Malaina continues to do well with exercise at cardiac rehab for her fitness level.  Oxygen  saturations, vital signs and CBG's remain stable.  Minami has gained 2.0  kg since starting cardiac rehab. Alliyah will complete cardiac rehab on 06/24/23. Eyana has been out due to having a healing wound on her toe from skin cancer removal.   Expected Outcomes Breeanna will continue to partipate in cardiac rehab for exercise, nutrition and lifestyle modifications. Abbygail will continue to partipate in cardiac rehab for exercise, nutrition and lifestyle modifications. Dora will continue to partipate in cardiac rehab for exercise, nutrition and lifestyle modifications. Sunjai will continue to partipate in cardiac rehab for exercise, nutrition and lifestyle modifications. Gwendalyn will continue to exercise, foloow  nutrition and lifestyle modifications upon completion of cardiac rehab.            Core Components/Risk Factors/Patient Goals at Discharge (Final Review):   Goals and Risk Factor Review - 06/21/23 1556       Core Components/Risk Factors/Patient Goals Review   Personal Goals Review Weight Management/Obesity;Improve shortness of breath with ADL's;Lipids;Heart Failure;Diabetes;Hypertension;Stress    Review Alianny continues to do well with exercise at cardiac rehab for her fitness level.  Oxygen  saturations, vital signs and CBG's remain stable.  Skylarr has gained 2.0  kg since starting cardiac rehab. Melvena will complete cardiac rehab on 06/24/23. Bristyn has been out due to having a healing wound on her toe from skin cancer removal.    Expected Outcomes Aloria will continue to exercise, foloow  nutrition and lifestyle modifications upon completion of cardiac rehab.             ITP Comments:  ITP Comments     Row Name 03/10/23 [phone] 03/14/23 1411 03/31/23 1520 05/03/23 0946 05/24/23 0840   ITP Comments Dr. Gaylyn Keas medical director. Introduction to  pritikin education/intensive cardiac rehab. Initial orientation packet reviewed with patient. 30 day ITP Review. Lucine started cardiac rehab on 03/14/23. Kacie did fair with exercise for her fitness level. Londan is decondtioned. 30 day ITP Review. Iliani has good attendance and participation in  cardiac rehab for her fitness level. 30 day ITP Review. Chaitra has good attendance and participation in cardiac rehab for her fitness level. Ozelle has been absent due to cold and fever symptoms. Jacqueleen plans to return to exercise on 05/04/23 30 day ITP Review. Kaylamarie has good attendance and participation in cardiac rehab since her return to exercise post URI.    Row Name 06/21/23 1553           ITP Comments 30 day ITP Review. Maudry has good attendance and participation in cardiac rehab since her return to exercise post skin cancer removal. Lannie will complete cardiac rehab on 06/22/22                Comments: See ITP comments.Monte Antonio RN BSN

## 2023-06-22 ENCOUNTER — Encounter (HOSPITAL_COMMUNITY)
Admission: RE | Admit: 2023-06-22 | Discharge: 2023-06-22 | Disposition: A | Discharge status: Home / Self Care | Source: Ambulatory Visit | Attending: Cardiology | Admitting: Cardiology

## 2023-06-22 DIAGNOSIS — Z48812 Encounter for surgical aftercare following surgery on the circulatory system: Secondary | ICD-10-CM | POA: Diagnosis not present

## 2023-06-22 DIAGNOSIS — Z9861 Coronary angioplasty status: Secondary | ICD-10-CM

## 2023-06-22 NOTE — Progress Notes (Signed)
 Discharge Progress Report  Patient Details  Name: [name] [name] [name]: [id number] Date of Birth: 1945/10/25 Referring Provider:   Flowsheet Row INTENSIVE CARDIAC REHAB ORIENT from 03/10/2023 in [facility]  Referring Provider Dr. Randene Bustard, MD        Number of Visits: 18  Reason for Discharge:  Patient reached a stable level of exercise.  Smoking History:  Social History   Tobacco Use  Smoking Status Never  Smokeless Tobacco Never    Diagnosis:  11/24/22 S/P PTCA RCA  ADL UCSD:   Initial Exercise Prescription:  Initial Exercise Prescription - 03/10/23 1200       Date of Initial Exercise RX and Referring Provider   Date 03/10/23    Referring Provider Dr. Randene Bustard, MD    Expected Discharge Date 06/01/23      Oxygen    Oxygen  Intermittent    Liters 2    Maintain Oxygen  Saturation 88% or higher      NuStep   Level 1    SPM 60    Minutes 20    METs 1.3      Prescription Details   Frequency (times per week) 2    Duration Progress to 30 minutes of continuous aerobic without signs/symptoms of physical distress      Intensity   THRR 40-80% of Max Heartrate 58-116    Ratings of Perceived Exertion 11-13    Perceived Dyspnea 0-4      Progression   Progression Continue progressive overload as per policy without signs/symptoms or physical distress.      Resistance Training   Training Prescription Yes    Weight 1    Reps 10-15             Discharge Exercise Prescription (Final Exercise Prescription Changes):  Exercise Prescription Changes - 06/01/23 1500       Response to Exercise   Blood Pressure (Admit) 128/72    Blood Pressure (Exit) 120/78    Heart Rate (Admit) 46 bpm    Heart Rate (Exercise) 89 bpm    Heart Rate (Exit) 55 bpm    Oxygen  Saturation (Admit) 97 %    Oxygen  Saturation (Exercise) 96 %    Oxygen  Saturation (Exit) 97 %    Rating of Perceived Exertion (Exercise) 12     Perceived Dyspnea (Exercise) 2    Symptoms SOB, RPD = 2    Comments Reviewed METs    Duration Continue with 30 min of aerobic exercise without signs/symptoms of physical distress.    Intensity THRR unchanged      Progression   Progression Continue to progress workloads to maintain intensity without signs/symptoms of physical distress.    Average METs 2.4      Resistance Training   Training Prescription No    Weight No weights on Wednesdays      Interval Training   Interval Training No      Oxygen    Oxygen  Continuous    Liters 2      NuStep   Level 3    SPM 118    Minutes 30    METs 2.4      Home Exercise Plan   Plans to continue exercise at Home (comment)    Frequency Add 3 additional days to program exercise sessions.    Initial Home Exercises Provided 05/11/23      Oxygen    Maintain Oxygen  Saturation 88% or higher  Functional Capacity:  6 Minute Walk     Row Name 03/10/23 1209 06/20/23 1430       6 Minute Walk   Phase Initial Discharge  NUSTEP TEST    Distance 1312 feet 2690 feet    Distance % Change -- 105.03 %    Distance Feet Change -- 1308 ft    Walk Time 6 minutes 6 minutes    # of Rest Breaks 1 0    MPH 2.48 --    METS 1.4 2.9    RPE 11 14    Perceived Dyspnea  1 2    VO2 Peak 4.89 19.3    Symptoms Yes (comment) Yes (comment)    Comments Supplemental Oxygen  during walk test prior to CP. Right side CP 4 mins in, then radiated to right side, discontinued Stepper test 5 mins in due to cp 2/10. CP resolved within 2 mins. Back to intake room pt became dizzy, eyes closed and unsteady, sat down BP 118/70, H2o recheck 5 mins 128/70. Dissy spell resolved within 1 min. AVG watts: 13, AVG MET's:1.5, Total steps: 471, AVG km: 0.40, AVG SPM: 89 SOB    Resting HR 50 bpm 60 bpm    Resting BP 142/64 124/64    Resting Oxygen  Saturation  96 % 97 %    Exercise Oxygen  Saturation  during 6 min walk 100 % 98 %  2L    Max Ex. HR 62 bpm 113 bpm    Max Ex.  BP 124/70 186/60    2 Minute Post BP 118/70 162/58             Psychological, QOL, Others - Outcomes: PHQ 2/9:    06/23/2023    3:05 PM 03/10/2023   10:36 AM 12/30/2021    3:01 PM 10/26/2021    1:54 PM 10/15/2021    3:38 PM  Depression screen PHQ 2/9  Decreased Interest 1 3 3 3 1   Down, Depressed, Hopeless 1 2 1 3  0  PHQ - 2 Score 2 5 4 6 1   Altered sleeping 2 2 3 1    Tired, decreased energy 2 3 3 3    Change in appetite 2 2 1  0   Feeling bad or failure about yourself  3 3 3 3    Trouble concentrating 2 2 1 2    Moving slowly or fidgety/restless 1 2 0 1   Suicidal thoughts 0 2 0 0   PHQ-9 Score 14 21 15 16    Difficult doing work/chores Somewhat difficult Somewhat difficult Very difficult Very difficult     Quality of Life:  Quality of Life - 06/23/23 0851       Quality of Life Scores   Health/Function Pre 8.53 %    Health/Function Post 7.93 %    Health/Function % Change -7.03 %    Socioeconomic Pre 27.5 %    Socioeconomic Post 22.5 %    Socioeconomic % Change  -18.18 %    Psych/Spiritual Pre 12 %    Psych/Spiritual Post 5.71 %    Psych/Spiritual % Change -52.42 %    Family Pre 24 %    Family Post 16.8 %    Family % Change -30 %    GLOBAL Pre 15.53 %    GLOBAL Post 11.89 %    GLOBAL % Change -23.44 %             Personal Goals: Goals established at orientation with interventions provided to work toward goal.  Personal Goals and  Risk Factors at Admission - 03/10/23 1228       Core Components/Risk Factors/Patient Goals on Admission    Weight Management Yes;Obesity;Weight Loss    Intervention Weight Management: Develop a combined nutrition and exercise program designed to reach desired caloric intake, while maintaining appropriate intake of nutrient and fiber, sodium and fats, and appropriate energy expenditure required for the weight goal.;Weight Management: Provide education and appropriate resources to help participant work on and attain dietary goals.;Weight  Management/Obesity: Establish reasonable short term and long term weight goals.;Obesity: Provide education and appropriate resources to help participant work on and attain dietary goals.    Expected Outcomes Long Term: Adherence to nutrition and physical activity/exercise program aimed toward attainment of established weight goal;Short Term: Continue to assess and modify interventions until short term weight is achieved;Weight Maintenance: Understanding of the daily nutrition guidelines, which includes 25-35% calories from fat, 7% or less cal from saturated fats, less than 200mg  cholesterol, less than 1.5gm of sodium, & 5 or more servings of fruits and vegetables daily;Weight Loss: Understanding of general recommendations for a balanced deficit meal plan, which promotes 1-2 lb weight loss per week and includes a negative energy balance of [phone] kcal/d;Understanding recommendations for meals to include 15-35% energy as protein, 25-35% energy from fat, 35-60% energy from carbohydrates, less than 200mg  of dietary cholesterol, 20-35 gm of total fiber daily;Understanding of distribution of calorie intake throughout the day with the consumption of 4-5 meals/snacks    Diabetes Yes    Intervention Provide education about signs/symptoms and action to take for hypo/hyperglycemia.;Provide education about proper nutrition, including hydration, and aerobic/resistive exercise prescription along with prescribed medications to achieve blood glucose in normal ranges: Fasting glucose 65-99 mg/dL    Expected Outcomes Short Term: Participant verbalizes understanding of the signs/symptoms and immediate care of hyper/hypoglycemia, proper foot care and importance of medication, aerobic/resistive exercise and nutrition plan for blood glucose control.;Long Term: Attainment of HbA1C < 7%.    Heart Failure Yes    Intervention Provide a combined exercise and nutrition program that is supplemented with education, support and counseling  about heart failure. Directed toward relieving symptoms such as shortness of breath, decreased exercise tolerance, and extremity edema.    Expected Outcomes Improve functional capacity of life;Short term: Attendance in program 2-3 days a week with increased exercise capacity. Reported lower sodium intake. Reported increased fruit and vegetable intake. Reports medication compliance.;Short term: Daily weights obtained and reported for increase. Utilizing diuretic protocols set by physician.;Long term: Adoption of self-care skills and reduction of barriers for early signs and symptoms recognition and intervention leading to self-care maintenance.    Hypertension Yes    Intervention Provide education on lifestyle modifcations including regular physical activity/exercise, weight management, moderate sodium restriction and increased consumption of fresh fruit, vegetables, and low fat dairy, alcohol  moderation, and smoking cessation.;Monitor prescription use compliance.    Expected Outcomes Short Term: Continued assessment and intervention until BP is < 140/60mm HG in hypertensive participants. < 130/56mm HG in hypertensive participants with diabetes, heart failure or chronic kidney disease.;Long Term: Maintenance of blood pressure at goal levels.    Lipids Yes    Intervention Provide education and support for participant on nutrition & aerobic/resistive exercise along with prescribed medications to achieve LDL 70mg , HDL >40mg .    Expected Outcomes Short Term: Participant states understanding of desired cholesterol values and is compliant with medications prescribed. Participant is following exercise prescription and nutrition guidelines.;Long Term: Cholesterol controlled with medications as prescribed, with individualized exercise RX  and with personalized nutrition plan. Value goals: LDL < 70mg , HDL > 40 mg.    Personal Goal Other Yes    Personal Goal Gain more independance and increase strength    Intervention  Will continue to monitor pt and progress workloads as tolerated without sign or symptom    Expected Outcomes Pt will achieve her goals              Personal Goals Discharge:  Goals and Risk Factor Review     Row Name 03/14/23 1416 03/31/23 1528 05/03/23 0949 05/24/23 0844 06/21/23 1556     Core Components/Risk Factors/Patient Goals Review   Personal Goals Review Weight Management/Obesity;Improve shortness of breath with ADL's;Lipids;Heart Failure;Diabetes;Hypertension;Stress Weight Management/Obesity;Improve shortness of breath with ADL's;Lipids;Heart Failure;Diabetes;Hypertension;Stress Weight Management/Obesity;Improve shortness of breath with ADL's;Lipids;Heart Failure;Diabetes;Hypertension;Stress Weight Management/Obesity;Improve shortness of breath with ADL's;Lipids;Heart Failure;Diabetes;Hypertension;Stress Weight Management/Obesity;Improve shortness of breath with ADL's;Lipids;Heart Failure;Diabetes;Hypertension;Stress   Review Zamyra started cardiac rehab on 03/14/23. Dominica did fair with exercise. nonsusutained sinus brady. Oxygen  saturations and CBG's stable. Jacqlyn Matas a rollator for stabilty. Manessa exercised on oxygen  and was encouraged to take frequent rest breaks. Faelyn is deconditioned. Briseida continues to do well with exercise at cardiac rehab for her fitness level.  Oxygen  saturations, vital signs and CBG's have been stable. Aysia remains decondtioned. Laurajean is enjoying participating in cardiac rehab and says that the program has been helpful for her so far. Kiyra continues to do well with exercise at cardiac rehab for her fitness level.  Oxygen  saturations, vital signs and CBG's remain stable.  Joanell has gained 1.7 kg since starting cardiac rehab. Sacoya plans to return to exercise on 05/04/23 after being absent with with cold and flu symptoms. Ashyia continues to do well with exercise at cardiac rehab for her fitness level.  Oxygen  saturations, vital signs and CBG's remain  stable.  Jadesola has gained 1.7 kg since starting cardiac rehab. Claudett will complete cardiac rehab in May Takera continues to do well with exercise at cardiac rehab for her fitness level.  Oxygen  saturations, vital signs and CBG's remain stable.  Kitiara has gained 2.0  kg since starting cardiac rehab. Shaughnessy will complete cardiac rehab on 06/24/23. Ferrell has been out due to having a healing wound on her toe from skin cancer removal.   Expected Outcomes Akeila will continue to partipate in cardiac rehab for exercise, nutrition and lifestyle modifications. Sharren will continue to partipate in cardiac rehab for exercise, nutrition and lifestyle modifications. Alianna will continue to partipate in cardiac rehab for exercise, nutrition and lifestyle modifications. Tyronda will continue to partipate in cardiac rehab for exercise, nutrition and lifestyle modifications. Damonie will continue to exercise, foloow  nutrition and lifestyle modifications upon completion of cardiac rehab.            Exercise Goals and Review:   Exercise Goals Re-Evaluation:  Exercise Goals Re-Evaluation     Row Name 03/14/23 1409 04/20/23 1500           Exercise Goal Re-Evaluation   Exercise Goals Review Increase Physical Activity;Increase Strength and Stamina;Able to understand and use rate of perceived exertion (RPE) scale;Knowledge and understanding of Target Heart Rate Range (THRR);Understanding of Exercise Prescription Increase Physical Activity;Increase Strength and Stamina;Able to understand and use rate of perceived exertion (RPE) scale;Knowledge and understanding of Target Heart Rate Range (THRR);Understanding of Exercise Prescription      Comments Pt's first day in the [facility] program. Pt understands the exercise Rx, THRR and RPE scale. Reviewed METs and goals. Pt  voices some imrpovement in her strength which is a goal.      Expected Outcomes Will continue to montior and progress exercise workloads as tolerated. Will  continue to montior and progress exercise workloads as tolerated.               Nutrition & Weight - Outcomes:  Pre Biometrics - 06/20/23 1432       Pre Biometrics   % Body Fat 55.7 %             Post Biometrics - 06/20/23 1432        Post  Biometrics   Height 5\' 1"  (1.549 m)    Weight 104.1 kg    Waist Circumference 47 inches    Hip Circumference 56 inches    Waist to Hip Ratio 0.84 %    BMI (Calculated) 43.39    Triceps Skinfold 45 mm    Grip Strength 8 kg    Flexibility --   Bacck pain and issues   Single Leg Stand --   Pt uses walker            Nutrition:  Nutrition Therapy & Goals - 06/22/23 1050       Nutrition Therapy   Diet Heart Healthy Diet    Drug/Food Interactions Statins/Certain Fruits      Personal Nutrition Goals   Nutrition Goal Patient to identify strategies for reducing cardiovascular risk by attending the [facility] education and nutrition series weekly.   goal met.   Personal Goal #2 Patient to improve diet quality by using the plate method as a guide for meal planning to include lean protein/plant protein, fruits, vegetables, whole grains, nonfat dairy as part of a well-balanced diet.   goal in progress.   Personal Goal #3 Patient to identify strategies for weight loss of 0.5-2.0# per week.   goal not met.   Comments Goals in progress. Malerie has completed cardiac rehab >4x in the past. She has medical history of CAD, CABG x5, CVA, CHF, carotid stent, DM2, HTN, OSA. Lipids are at goal. A1c is well controlled at 6.1, She is up 3.7# since starting with our program. She continues to attend the [facility] education and nutrition series regularly. We have discussed multiple strategies for weight loss including calorie density, protein shakes, high protein/high fiber intake, Healthy Weight & Wellness, etc. including Patient will continue to benefit from adherence to nutrition, exercise, and lifestyle modification.      Intervention Plan    Intervention Prescribe, educate and counsel regarding individualized specific dietary modifications aiming towards targeted core components such as weight, hypertension, lipid management, diabetes, heart failure and other comorbidities.;Nutrition handout(s) given to patient.    Expected Outcomes Short Term Goal: Understand basic principles of dietary content, such as calories, fat, sodium, cholesterol and nutrients.;Long Term Goal: Adherence to prescribed nutrition plan.             Nutrition Discharge:  Nutrition Assessments - 06/22/23 1047       Rate Your Plate Scores   Pre Score 74    Post Score 80             Education Questionnaire Score:  Knowledge Questionnaire Score - 06/23/23 0847       Knowledge Questionnaire Score   Post Score 21/24             Goals reviewed with patient; copy given to patient.Pt graduates from  Intensive/Traditional cardiac rehab program on 06/22/23 with completion of 52 exercise and education  sessions. Pt maintained good attendance and progressed nicely during their participation in rehab as evidenced by increased MET level. Although Kenna gained 1.7 kg. Rogan increased her distance on her post exercise walk test by 1308 feet.  Medication list reconciled. Repeat  PHQ score- 14 . I encouraged Navjot to reach out to Dr Vela Gerhard about her continued struggle with depression as Rheya is taking an antidepressant. Pt has made significant lifestyle changes and should be commended for their success. Trina  achieved her goals during cardiac rehab.   Pt plans to continue exercise at home using a home stepper and doing stretches. Missi says she has enjoyed participating in cardiac rehab and has been helpful. We are proud of Almina's progress!Monte Antonio RN BSN

## 2023-07-12 NOTE — Addendum Note (Signed)
 Encounter addended by: Cyndee Dragon, RN on: 07/12/2023 3:02 PM  Actions taken: Clinical Note Signed

## 2023-07-28 ENCOUNTER — Encounter (HOSPITAL_COMMUNITY)

## 2023-07-29 NOTE — Progress Notes (Signed)
 Date:  08/01/2023   ID:  [name] [name], DOB [birth year], [name] [id number]   Provider location: [place] [facility] Type of Visit: Established patient   PCP:  Windy Coy, MD  HF Cardiologist:  Dr. Rolan   History of Present Illness: [name] [name] is a 78 y.o. female who has a history of CAD s/p CABG, diastolic CHF, and cerebrovascular disease with history of CVA presents for followup of CHF and diastolic CHF. She had CABG x 5 in 12/11.  Prior to the CABG she had multiple PCIs.  She had a CVA in 2/14 that presented as visual loss.  She had a left carotid stent in 2/15.    Left heart cath done in Sept. 2014. This showed patent SVG-D, LIMA-LAD, and sequential SVG-OM branches but SVG-RCA and the native RCA were both totally occluded. It was felt that her increased symptoms coincided with occlusion of SVG-RCA. She was started on Imdur  to see if this would help with the chest pain and dyspnea. However, she feels like Imdur  causes leg cramps and does not think that she can take it. So ranolazine  500 mg bid started and titrated this up to 1000 mg bid.  This helped some but not markedly. Therefore, sent to Dr. Dann to address opening RCA CTO. He was able to do this in 5/15 with 4 overlapping Xience DES.  This led to resolution of exertional chest pain.     [name] was started on Brilinta  after PCI.  She became much more short of breath after starting on Brilinta . Brilinta  stopped and replaced with Plavix .  Dyspnea improved off Brilinta . Given increasing exertional dyspnea and chest pain,  RHC/LHC was done in 4/17.  This showed stable CAD with no interventional target.  Left and right heart filling pressures were not significantly increased.  Medical management.    She had an MRI/MRA in May 2018 that showed moderately severe stenosis of the supraclinoid segment of the right internal carotid artery, progressed since the 2016 MR angiogram, and severe basilar artery stenosis.    She  developed recurrent exertional chest pain and had LHC in 6/18, showing 4/5 grafts patent with patent native RCA (similar to prior cath, no changes).     Echo 1/19 with EF 55-60%, moderate diastolic dysfunction, PASP 50 mmHg, mildly dilated RV, mild AS, mild-moderate MR.    She reported increased dyspnea and chest pain and had RHC/LHC in 9/19.  She had DES to sequential SVG-OM1/PLOM. While hospitalized, she was noted to have runs of atrial fibrillation.  She was very symptomatic with the atrial fibrillation. Eliquis  and amiodarone  were started.    She was admitted 12/16-12/19/19 for cardiomems implant and RHC. She was diuresed with IV lasix  and transitioned to torsemide  100 mg BID + metolazone  once/week. DC weight: 295 lbs.   She had a prolonged hospitalization in 2/20 with acute respiratory failure and fever to 103.  She was thought to have PNA and treated with cefepime .  She was intubated.  We were also concerned for amiodarone  pulmonary toxicity with ESR 113.  Amiodarone  was stopped and she was put on prednisone . Echo in 2/20 showed EF down to 25-30% with mid-apical LV severe hypokinesis. She had AKI. Possible ITP triggered by infection, HIT negative.  ITP was treated with Solumedrol. She was discharged to SNF.    ORIF left ankle 4/20.   11/20 admission initially with concern for TIAs (staring spells), ended up thinking possible partial seizures.  Head MRI did not  show CVA.  Echo in 11/20 showed EF 45-50%, moderately decreased RV systolic function, moderate RV enlargement, moderate-severe MR, moderate TR.   Patient was admitted again in 12/20 with AKI and hypotension in the setting of sinus bradycardia (HR 30s).  She was volume overloaded on exam.  Toprol  XL was stopped and HR increased to 60s.  She was diuresed with IV Lasix .  TEE in 12/20 showed EF 50-55%, septal-lateral dyssynchrony, mildly decreased RV function, moderate MR.   Atypical chest pain in 1/21, Cardiolite  showed EF 57%, fixed  inferior defect (likely artifact), no ischemia.   Echo in 5/22 showed EF 60-65%, moderate LVH, normal RV, PASP 37 mmHg, 2.5 x 2.1 cm calcified mass posterior mitral annulus with mild-moderate MR => ?severe MAC but more circumscribed and prominent than in the past, mild AS mean gradient 12 mmHg and AVA 1.83 cm^2.  Echo in 11/22 showed EF 55%, normal RV, mass posterior mitral annulus, moderate MR, mild AS, IVC normal.  Cardiac MRI was done in 1/23 to investigate posterior mitral annulus mass; study showed LV EF 48%, mild LV dilation, normal RV with EF 53%, extensive MAC is likely the source of the posterior mitral annulus mass, basal-mid inferior >50% subendocardial LGE suggestive of prior inferior MI.   Follow up 2/23 she had chest tightness x 4 weeks. Arranged for [facility] which showed in-stent restenosis of pRCA, felt to be the source of her symptoms given occluded SVG-RCA. Underwent PTCA to pRCA. RHC with normal PCWP and RA pressure, mild pulmonary hypertension, preserved CO. On ASA + Plavix  + Eliquis  x 1 month (until 05/21/21), then Plavix  + Eliquis .   Echo 8/24 showed EF significantly worse, down to 30-35%, G1DD, normal RV.   With worsening dyspnea and exertional chest pain, LHC/RHC in 9/24 showed mildly elevated PCWP with normal RA pressure, mixed pulmonary venous/pulmonary arterial hypertension, and 99% in-stent restenosis in the proximal RCA, at least 2 layers of stent are present. She then had cutting balloon angioplasty to ISR ostial to distal RCA.   Echo 1/25 showed LV mildly dilated with mild LVH, EF 45-50% on my read with septal-lateral dyssynchrony consistent with IVCD, mild RV dysfunction, mild mitral stenosis mean gradient 4 mmHg, no aortic stenosis.   Zio patch 2/25 showed mostly NSR, rare PVCs, and 3 short SVT runs.  Today she returns for HF follow up. Overall feeling fair. Having palpitations and more fatigued. BP variable at home, 60-200/40-98. Gaining weight, but stopped Ozempic  because  she was feeling so bad. Nausea has improved off GLP but wants to get back on as weight is creeping up. She is chronically dizzy and continues with chronic CP. Legs are swelling. Denies abnormal bleeding, or PND/Orthopnea. Chronically sleeps reclined. Appetite ok.  Weight at home 229 pounds. Taking all medications. Wears oxygen  every night and PRN during the day.  Cardiomems (personally reviewed): goal 10, PAD 9  ECG (personally reviewed): SB, LBBB  Labs (3/24): BNP 163, K 4.7, creatinine 0.9 Labs (7/24): K 4.9, creatinine 0.95, LDL 83 Labs (9/24): K 4.2, SCr 1.16, LDL 25 Labs (10/24): K 4.3, SCr 1.0  Labs (12/24): K 4.1, creatinine 0.88 Labs (2/25): K 4.9, creatinine 1.57 Labs (3/25): K 4.6, creatinine 1.56   PMH: 1. Diabetic gastroparesis 2. Type II diabetes 3. HTN 4. Morbid obesity 5. CAD: s/p CABG in 12/11 after prior PCIs.  LIMA-LAD, SVG-D, seq SVG-OM1 and OM2, SVG-PDA. Adenosine  Cardiolite  (8/14) with EF 53% and a small reversible apical defect with a medium, partially reversible inferior defect.  LHC (9/14) with patent SVG-D, LIMA-LAD (50% distal LAD), and sequential SVG-OM branches; the SVG-RCA and the native RCA were occluded.  This was managed medically initially, but with ongoing exertional chest pain, it was decided to open CTO.  Patient had CTO opening with 4 overlapping Xience DES in the RCA in 5/15.   - LHC (4/17): SVG-D patent, LIMA-LAD patent, distal LAD with several 40-50% stenoses, sequential SVG-OM1 and PLOM patent with 50% proximal stenosis (not flow limiting), SVG-RCA TO with patent RCA stents.  - LHC (6/18): 4/5 grafts patent (SVG-RCA TO, same as past), RCA stents patent => no change.  - LHC (9/19): Sequential SVG-PLOM/OM1 with 80% proximal stenosis, s/p DES.  - Cardiolite  (1/21): EF 57%, fixed inferior defect (likely artifact), no ischemia.  - LHC (3/23): severe CAD with 90% in-stent restenosis of pRCA, s/p PTCA to in-stent restenosis of pRCA - LHC (9/24): 99% in-stent  restenosis in the proximal RCA, at least 2 layers of stent are present => patient had cutting balloon angioplasty to RCA.  6. Atypical migraines 7. OHS/OSA: Intolerant of CPAP. Uses oxygen  with exertion and at night.  8. GERD with hiatal hernia 9. OA 10. HFpEF: She has a Cardiomems device.  Echo (11/13) with EF 50-55%, grade II diastolic dysfunction, mild-moderate MR.  Echo (8/14) with EF 55-60%, grade II diastolic dysfunction, mildly increased aortic valve gradient (mean 12 mmHg) but valve opens well, mild MR and mild RV dilation.  Echo (9/15) with EF 60-65%, grade II diastolic dysfunction, mild aortic stenosis, mild mitral stenosis, mild to moderate mitral regurgitation, mildly dilated RV with normal systolic function, PA systolic pressure 46 mmHg.  - RHC (4/17): mean RA 9, PA 38/15 mean 27, mean PCWP 9, CI 2.5.  - Echo (5/17): EF 55-60%, mild LVH, mildly dilated RV with low normal systolic function, moderate TR, PASP 61 mmHg - Echo (1/19): EF 55-60%, moderate diastolic dysfunction, PASP 50 mmHg, mildly dilated RV, mild AS, mild-moderate MR. - RHC (9/19): mean RA 11, PA 34/12, mean PCWP 15, CI 2.85 - Echo (3/20): EF 25-30% with mid-apical severe hypokinesis.  - Echo (11/20): EF 45-50%, moderately dilated RV with moderately decreased systolic function, moderate-severe MR, moderate TR.  - TEE (12/20): EF 50-55%, septal-lateral dyssynchrony, moderate RV dilation/mildly decreased function, peak RV-RA gradient 51 mmHg, moderate MR.  - Echo (5/22): EF 60-65%, moderate LVH, normal RV, PASP 37 mmHg, 2.5 x 2.1 cm calcified mass posterior mitral annulus with mild-moderate MR => ?severe MAC but more circumscribed and prominent than in the past, mild AS mean gradient 12 mmHg and AVA 1.83 cm^2.  - Echo (11/22): EF 55%, normal RV, mass posterior mitral annulus, moderate MR, mild AS, IVC normal. - Cardiac MRI in 1/23 showed LV EF 48%, mild LV dilation, normal RV with EF 53%, extensive MAC is likely the source of  the posterior mitral annulus mass, basal-mid inferior >50% subendocardial LGE suggestive of prior inferior MI.  - RHC (3/23): normal PCWP (14) and RA pressure (2) , mild pulmonary hypertension, preserved CO/CI (6.51/3.28). - Echo (8/24): EF 30-35%, G1DD, normal RV.  - RHC (9/24): mean RA 5, PA 59/15 mean 36, mean PCWP 22, CI 2.34, PVR 3 WU.   - Echo (1/25): LV mildly dilated with mild LVH, EF 45-50% on my read with septal-lateral dyssynchrony consistent with IVCD, mild RV dysfunction, mild mitral stenosis mean gradient 4 mmHg, no aortic stenosis.  11. CKD 12. Chronic LBBB 13. Anxiety 14. Carotid stenosis: Followed by [facility], >80% LICA stenosis 12/14.  She  had left carotid stent in 2/15. Carotid dopplers 8/15 with no significant disease. Carotid dopplers (4/16): < 40% RICA stenosis.  - Carotid dopplers (9/22): 1-39% BICA stenosis.  - Carotid dopplers (11/24): mild BICA stenosis.  15. Cerebrovascular disease: CVA 2/14 with right posterior cerebral artery territory ischemic infarction. Cerebral angiogram in 6/14 showed 70% right vertebral ostial stenosis, 60-65% LICA stenosis, > 70% proximal left posterior cerebral artery stenosis, posterior communicating artery aneurysm.  Patient has had episodes of transient expressive aphasia.  Carotid dopplers (12/14) showed >80% LICA stenosis. She had a left carotid stent 2/15.  Possible CVA in 7/15. - MRI/MRA in May 2018 that showed moderately severe stenosis of the supraclinoid segment of the right internal carotid artery, progressed since the 2016 MR angiogram and severe basilar artery stenosis.  16. Positional vertigo (suspected) 17. Palpitations: Holter (6/15) with rare PVCs/PACs.  - Event monitor (2/18): NSR, occasional PVCs - 14 day Zio (9/23) mostly NSR, rare PVCs and PACs, few short runs of SVT, no atrial fibrillation or worrisome arrhythmias. 18. Dyspnea with Brilinta . 19. Melanoma: On face, s/p excision.   20. Aortic stenosis: Mild on 11/22 echo.  21.  Posterior mitral annular mass: Cardiac MRI was done in 1/23 to investigate posterior mitral annulus mass; study showed LV EF 48%, mild LV dilation, normal RV with EF 53%, extensive MAC is likely the source of the posterior mitral annulus mass, basal-mid inferior >50% subendocardial LGE suggestive of prior inferior MI.  22. Lower extremity arterial dopplers (9/16) were normal.  23. Sleep study (4/18): No significant OSA.  24. Atrial fibrillation: Paroxysmal - Amiodarone  stopped, ?lung toxicity.  25. H/o ITP in 3/20.  26. Partial seizures 27. Mitral regurgitation: Moderate on TEE in 12/20.  - Mild to moderate on 5/22 echo.  - Moderate on 11/22 echo  Current Outpatient Medications  Medication Sig Dispense Refill   acetaminophen  (TYLENOL ) 500 MG tablet Take 500 mg by mouth every 6 (six) hours as needed for mild pain, moderate pain, fever or headache.     albuterol  (VENTOLIN  HFA) 108 (90 Base) MCG/ACT inhaler Inhale 2 puffs into the lungs every 4 (four) hours as needed for wheezing or shortness of breath.      ALPRAZolam  (XANAX ) 0.5 MG tablet Take 0.5-1 mg by mouth See admin instructions. Take 0.5 mg  morning at lunch and 1 mg at bedtime     B Complex-C (B-COMPLEX WITH VITAMIN C) tablet Take 1 tablet by mouth in the morning.     baclofen (LIORESAL) 10 MG tablet Take 10 mg by mouth at bedtime.     bisacodyl  (DULCOLAX) 5 MG EC tablet Take 5 mg by mouth daily as needed for moderate constipation.     buPROPion  (WELLBUTRIN  XL) 300 MG 24 hr tablet Take 300 mg by mouth daily.     carboxymethylcellulose (REFRESH PLUS) 0.5 % SOLN Place 1 drop into both eyes in the morning, at noon, and at bedtime.     Cholecalciferol  (VITAMIN D3) 50 MCG (2000 UT) capsule Take 2,000 Units by mouth daily in the afternoon.     clopidogrel  (PLAVIX ) 75 MG tablet Take 75 mg by mouth every morning.     DULoxetine  (CYMBALTA ) 30 MG capsule Take 1 capsule (30 mg total) by mouth daily. 30 capsule 0   ELIQUIS  5 MG TABS tablet TAKE ONE  (1) TABLET BY MOUTH TWO (2) TIMES DAILY 180 tablet 3   ezetimibe  (ZETIA ) 10 MG tablet Take 1 tablet (10 mg total) by mouth at bedtime. 90 tablet  3   fluocinonide cream (LIDEX) 0.05 % Apply 1 application. topically 2 (two) times daily as needed (irritation).     Fluticasone-Salmeterol (ADVAIR) 250-50 MCG/DOSE AEPB Inhale 1 puff into the lungs daily.     gabapentin  (NEURONTIN ) 600 MG tablet Take 600-1,200 mg by mouth See admin instructions. Take 1 tablet (600 mg) by mouth in the morning & take 2 tablets (1200 mg) by mouth at bedtime     inclisiran (LEQVIO ) 284 MG/1.5ML SOSY injection Inject 284 mg into the skin every 6 (six) months.     Insulin  Glargine (LANTUS  SOLOSTAR) 100 UNIT/ML Solostar Pen Inject 0-8 Units into the skin at bedtime. Per sliding scale     insulin  lispro (HUMALOG ) 100 UNIT/ML KwikPen Inject 4-6 Units into the skin See admin instructions. Sliding scale Inject 8 units subcutaneously prior to breakfast and supper; add 4 units for CBG >200     loperamide  (IMODIUM  A-D) 2 MG tablet Take 2 mg by mouth 4 (four) times daily as needed for diarrhea or loose stools.     Multiple Vitamin (MULTIVITAMIN WITH MINERALS) TABS tablet Take 1 tablet by mouth every morning. Centrum - Women over 50     Multiple Vitamins-Minerals (OCUVITE EYE HEALTH FORMULA) CAPS Take 1 capsule by mouth every morning.     nitroGLYCERIN  (NITROSTAT ) 0.3 MG SL tablet DISSOLVE 1 TABLET UNDER TONGUE EVERY 5 MINUTES FOR UP TO 3 DOSES AS NEEDED FOR CHEST PAIN 100 tablet 0   ondansetron  (ZOFRAN -ODT) 4 MG disintegrating tablet Take 4 mg by mouth 2 (two) times daily.     OXYGEN  Inhale 2 L into the lungs at bedtime. Additional during the day     pantoprazole  (PROTONIX ) 40 MG tablet Take 1 tablet (40 mg total) by mouth daily. 30 tablet 0   potassium chloride  (KLOR-CON  M) 10 MEQ tablet Patient takes 1 tablet by mouth every 3rd day as directed 15 tablet 5   pyridOXINE (VITAMIN B6) 100 MG tablet Take 100 mg by mouth every morning.      ranolazine  (RANEXA ) 1000 MG SR tablet Take 1 tablet (1,000 mg total) by mouth 2 (two) times daily. 180 tablet 3   rosuvastatin  (CRESTOR ) 40 MG tablet Take 1 tablet (40 mg total) by mouth daily. 90 tablet 3   sacubitril -valsartan  (ENTRESTO ) 97-103 MG Take 1 tablet by mouth 2 (two) times daily. 180 tablet 3   Semaglutide , 2 MG/DOSE, (OZEMPIC , 2 MG/DOSE,) 8 MG/3ML SOPN Inject 2 mg into the skin once a week. 3 mL 11   spironolactone  (ALDACTONE ) 25 MG tablet Take 1 tablet (25 mg total) by mouth daily. 90 tablet 3   torsemide  (DEMADEX ) 20 MG tablet Take 1 tablet by mouth every 3rd day as directed. 15 tablet 5   traZODone  (DESYREL ) 50 MG tablet Take 100-150 mg by mouth at bedtime.     vitamin B-12 (CYANOCOBALAMIN ) 1000 MCG tablet Take 1,000 mcg by mouth every morning.      zolpidem  (AMBIEN ) 10 MG tablet Take 10 mg by mouth at bedtime.     No current facility-administered medications for this encounter.   Allergies:   Amoxicillin, Brilinta  [ticagrelor ], Erythromycin, Flagyl [metronidazole], Penicillins, Isosorbide  mononitrate [isosorbide  nitrate], Jardiance [empagliflozin], Metformin and related, Tape, and Cefotaxime   Social History:  The patient  reports that she has never smoked. She has never used smokeless tobacco. She reports that she does not drink alcohol  and does not use drugs.   Family History:  The patient's family history includes AAA (abdominal aortic aneurysm) in her father  and mother; Cancer in her sister; Deep vein thrombosis in her father and mother; Dementia in her maternal grandmother and mother; Diabetes in her father, paternal aunt, paternal grandmother, paternal uncle, and paternal uncle; Heart attack in her father; Heart attack (age of onset: 7) in her paternal grandfather; Heart disease in her father and paternal uncle; Hyperlipidemia in her father; Hypertension in her father, mother, and sister; Stroke in her maternal grandmother, paternal grandmother, and paternal uncle.   ROS:   Please see the history of present illness.   All other systems are personally reviewed and negative.   Recent Labs: 10/22/2022: ALT 20; TSH 0.750 10/25/2022: Magnesium  2.2 04/12/2023: B Natriuretic Peptide 88.6; BUN 18; Creatinine, Ser 1.56; Hemoglobin 13.6; Platelets 148; Potassium 4.6; Sodium 144  Personally reviewed   Wt Readings from Last 3 Encounters:  08/01/23 105.1 kg (231 lb 9.6 oz)  06/20/23 104.1 kg (229 lb 8 oz)  04/12/23 103.1 kg (227 lb 6.4 oz)    BP (!) 160/80   Pulse (!) 53   Ht 5' 1 (1.549 m)   Wt 105.1 kg (231 lb 9.6 oz)   SpO2 97%   BMI 43.76 kg/m   PHYSICAL EXAM: General:  NAD. No resp difficulty, walked into clinic with rolling walker HEENT: Normal Neck: Supple. No JVD. Cor: Regular rate & rhythm. No rubs, gallops or murmurs. Lungs: Clear Abdomen: Soft, obese, nontender, nondistended.  Extremities: No cyanosis, clubbing, rash, edema Neuro: Alert & oriented x 3, moves all 4 extremities w/o difficulty. Affect pleasant.  ASSESSMENT AND PLAN: 1. CAD: Occluded native RCA and SVG-RCA.  Status post opening of CTO RCA with 4 overlapping Xience DES in 5/15.  DES to sequential SVG-OM1/PLOM in 9/19. LHC in 3/23 showed severe native coronary disease with 90% in-stent restenosis in the proximal RCA (patient has 2 layers of stent at this site). The sequential SVG-OM1/PLOM was patent, SVG-D was patent, and LIMA-LAD was patent. She underwent PTCA of pRCA. She has chronic angina, however exertional dyspnea has been more prominent. Recent echo showed EF further reduced, now 30-35%. Cath 9/24 showed 99% in-stent restenosis in the proximal RCA (known occluded SVG-RCA). Other coronary disease was stable. She underwent successful cutting balloon angioplasty to ISR in the RCA w/ only 20-30% distal residual stenosis. She has atypical chest pain, more so when experiencing palpitations. - Continue apixaban  and Plavix  (has been on chronic Plavix , neuro has wanted her to stay on this for  cerebrovascular disease).  No bleeding issues.  - Continue ranolazine  1000 mg bid.  - Off Imdur  with headache. - Off Toprol  XL with bradycardia.   - Continue Crestor , Leqvio  and Zetia , good LDL in 9/24.  - She graduated from [facility]. 2. Chronic systolic CHF: Echo in 1/19 with EF 55-60%, moderate diastolic dysfunction. Cardiomems placed 01/16/18.  Echo in 3/20 in setting of severe PNA with intubation showed EF 25-30%, mid-apical LV severe hypokinesis.  Possible stress (Takotsubo-type) cardiomyopathy related to severe medical illness versus progression of CAD.  She has baseline chronic LBBB which could play a role in cardiomyopathy as well (LBBB cardiomyopathy).  Repeat echo in 11/20 with EF up to 45-50%, moderately dysfunctional RV.  TEE in 12/20 with EF 50-55% with septal-lateral dyssynchrony and mildly dysfunctional RV.  Echo in 11/22 with EF 55% with normal RV.  cMRI in 1/23 with EF 48% and evidence for prior inferior MI.  Echo 7/24 30-35%, G1DD, normal RV.  Echo was done today showing improved LV systolic function, LV mildly dilated with mild  LVH, EF 45-50% on my read with septal-lateral dyssynchrony consistent with LBBB, mild RV dysfunction, mild mitral stenosis mean gradient 4 mmHg, no aortic stenosis. Chronic NYHA class III, this is confounded by obesity/deconditioning.  She is not volume overloaded on exam or Cardiomems.  - Continue torsemide  20 mg every 3rd day. BMET today. - Continue Entresto  97/103 mg bid. - Continue spironolactone  25 mg daily. - Cannot tolerate SGLT2i. - Not on beta blocker due to bradycardia.  - EF is now out of range for CRT-D device (has wide LBBB).  3. Hyperlipidemia: She remains on Zetia , Crestor  and Leqvio .  - Good lipids in 9/24.  4. Hypertension: BP elevated today, she continues with orthostatic symptoms. - I asked her to continue to check BP at home & log. Notify clinic if sBP > 140 or <100. 5. Cerebrovascular disease: She has history of CVA and had left  carotid stent in 2/15. Followed by [facility].  Last MRA head showed severe stenosis of supraclinoid RICA and severe basilar artery stenosis.  - Neurology would like her to continue Plavix  for this long-term despite concomitant apixaban  use.  6. OHS/OSA: Cannot tolerate CPAP.  Uses oxygen  at night.  7. Atrial fibrillation: Paroxysmal. She had suspected amiodarone  lung toxicity and is now off amiodarone . NSR on ECG today. - Continue apixaban  5 mg bid.  8. Obesity: Body mass index is 43.76 kg/m. - She stopped her Ozempic , previously on 2 mg dose. She would like to get back on GLP. Refer back to pharmacy clinic to initiate. ? If tripeptide would result in more meaningful weight loss. 9. Mitral regurgitation: Mild on 1/25 echo.   10. Bradycardia: Now off Toprol  XL.  Noted to have junctional beats at previous visit in addition to NSR on ECG with slow rate. Zio patch 2/25 mostly NSR, rare PVCs.  11. Mitral annular mass: This is a well-circumscribed calcified posterior mitral annulus mass noted on echoes.  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  Follow up in 3 months with Dr. Rolan  Signed, Harlene CHRISTELLA Gainer, FNP  08/01/2023  [facility] [address] [facility] [place] [address] [phone] (office) [phone] (fax)

## 2023-08-01 ENCOUNTER — Encounter (HOSPITAL_COMMUNITY): Payer: Self-pay

## 2023-08-01 ENCOUNTER — Ambulatory Visit (HOSPITAL_COMMUNITY)
Admission: RE | Admit: 2023-08-01 | Discharge: 2023-08-01 | Disposition: A | Discharge status: Home / Self Care | Source: Ambulatory Visit | Attending: Family Medicine | Admitting: Family Medicine

## 2023-08-01 VITALS — BP 160/80 | HR 53 | Ht 61.0 in | Wt 231.6 lb

## 2023-08-01 DIAGNOSIS — Z6841 Body Mass Index (BMI) 40.0 and over, adult: Secondary | ICD-10-CM | POA: Insufficient documentation

## 2023-08-01 DIAGNOSIS — Z955 Presence of coronary angioplasty implant and graft: Secondary | ICD-10-CM | POA: Diagnosis not present

## 2023-08-01 DIAGNOSIS — E1169 Type 2 diabetes mellitus with other specified complication: Secondary | ICD-10-CM | POA: Diagnosis not present

## 2023-08-01 DIAGNOSIS — G4733 Obstructive sleep apnea (adult) (pediatric): Secondary | ICD-10-CM

## 2023-08-01 DIAGNOSIS — I447 Left bundle-branch block, unspecified: Secondary | ICD-10-CM | POA: Diagnosis not present

## 2023-08-01 DIAGNOSIS — D693 Immune thrombocytopenic purpura: Secondary | ICD-10-CM | POA: Diagnosis not present

## 2023-08-01 DIAGNOSIS — I10 Essential (primary) hypertension: Secondary | ICD-10-CM

## 2023-08-01 DIAGNOSIS — Z85820 Personal history of malignant melanoma of skin: Secondary | ICD-10-CM | POA: Insufficient documentation

## 2023-08-01 DIAGNOSIS — R002 Palpitations: Secondary | ICD-10-CM | POA: Insufficient documentation

## 2023-08-01 DIAGNOSIS — I493 Ventricular premature depolarization: Secondary | ICD-10-CM | POA: Diagnosis not present

## 2023-08-01 DIAGNOSIS — I2582 Chronic total occlusion of coronary artery: Secondary | ICD-10-CM | POA: Insufficient documentation

## 2023-08-01 DIAGNOSIS — I2721 Secondary pulmonary arterial hypertension: Secondary | ICD-10-CM | POA: Insufficient documentation

## 2023-08-01 DIAGNOSIS — Z9981 Dependence on supplemental oxygen: Secondary | ICD-10-CM | POA: Diagnosis not present

## 2023-08-01 DIAGNOSIS — Z7901 Long term (current) use of anticoagulants: Secondary | ICD-10-CM | POA: Insufficient documentation

## 2023-08-01 DIAGNOSIS — Z9889 Other specified postprocedural states: Secondary | ICD-10-CM | POA: Insufficient documentation

## 2023-08-01 DIAGNOSIS — I272 Pulmonary hypertension, unspecified: Secondary | ICD-10-CM | POA: Insufficient documentation

## 2023-08-01 DIAGNOSIS — R0789 Other chest pain: Secondary | ICD-10-CM | POA: Diagnosis not present

## 2023-08-01 DIAGNOSIS — I480 Paroxysmal atrial fibrillation: Secondary | ICD-10-CM | POA: Insufficient documentation

## 2023-08-01 DIAGNOSIS — Z8673 Personal history of transient ischemic attack (TIA), and cerebral infarction without residual deficits: Secondary | ICD-10-CM | POA: Diagnosis not present

## 2023-08-01 DIAGNOSIS — I130 Hypertensive heart and chronic kidney disease with heart failure and stage 1 through stage 4 chronic kidney disease, or unspecified chronic kidney disease: Secondary | ICD-10-CM | POA: Diagnosis not present

## 2023-08-01 DIAGNOSIS — I25119 Atherosclerotic heart disease of native coronary artery with unspecified angina pectoris: Secondary | ICD-10-CM | POA: Diagnosis not present

## 2023-08-01 DIAGNOSIS — R001 Bradycardia, unspecified: Secondary | ICD-10-CM | POA: Insufficient documentation

## 2023-08-01 DIAGNOSIS — H547 Unspecified visual loss: Secondary | ICD-10-CM | POA: Insufficient documentation

## 2023-08-01 DIAGNOSIS — Z7985 Long-term (current) use of injectable non-insulin antidiabetic drugs: Secondary | ICD-10-CM | POA: Insufficient documentation

## 2023-08-01 DIAGNOSIS — I25810 Atherosclerosis of coronary artery bypass graft(s) without angina pectoris: Secondary | ICD-10-CM | POA: Insufficient documentation

## 2023-08-01 DIAGNOSIS — E785 Hyperlipidemia, unspecified: Secondary | ICD-10-CM | POA: Diagnosis not present

## 2023-08-01 DIAGNOSIS — I5022 Chronic systolic (congestive) heart failure: Secondary | ICD-10-CM

## 2023-08-01 DIAGNOSIS — Z79899 Other long term (current) drug therapy: Secondary | ICD-10-CM | POA: Diagnosis not present

## 2023-08-01 DIAGNOSIS — I340 Nonrheumatic mitral (valve) insufficiency: Secondary | ICD-10-CM | POA: Insufficient documentation

## 2023-08-01 DIAGNOSIS — R519 Headache, unspecified: Secondary | ICD-10-CM | POA: Diagnosis not present

## 2023-08-01 DIAGNOSIS — I252 Old myocardial infarction: Secondary | ICD-10-CM | POA: Diagnosis not present

## 2023-08-01 DIAGNOSIS — Z7951 Long term (current) use of inhaled steroids: Secondary | ICD-10-CM | POA: Insufficient documentation

## 2023-08-01 DIAGNOSIS — I5042 Chronic combined systolic (congestive) and diastolic (congestive) heart failure: Secondary | ICD-10-CM | POA: Insufficient documentation

## 2023-08-01 DIAGNOSIS — Z794 Long term (current) use of insulin: Secondary | ICD-10-CM | POA: Insufficient documentation

## 2023-08-01 DIAGNOSIS — Z951 Presence of aortocoronary bypass graft: Secondary | ICD-10-CM | POA: Insufficient documentation

## 2023-08-01 DIAGNOSIS — Z7902 Long term (current) use of antithrombotics/antiplatelets: Secondary | ICD-10-CM | POA: Diagnosis not present

## 2023-08-01 DIAGNOSIS — I471 Supraventricular tachycardia, unspecified: Secondary | ICD-10-CM | POA: Insufficient documentation

## 2023-08-01 NOTE — Patient Instructions (Signed)
 Medication Changes:  No Changes In Medications at this time.   Lab Work:  Labs done today, your results will be available in MyChart, we will contact you for abnormal readings.  Referrals:  MESSAGE SENT TO PHARMACY CLINIC REGARDING YOUR GLP-1 MEDICATION   Special Instructions // Education:  PLEASE NOTIFY THE CLINIC IF YOUR BLOOD PRESSURE IS LESS THAN 100 OR GREATER THAN 140 ON THE TOP NUMBER  Follow-Up in: 3 MONTHS AS SCHEDULED   At the [facility], you and your health needs are our priority. We have a designated team specialized in the treatment of Heart Failure. This Care Team includes your primary Heart Failure Specialized Cardiologist (physician), Advanced Practice Providers (APPs- Physician Assistants and Nurse Practitioners), and Pharmacist who all work together to provide you with the care you need, when you need it.   You may see any of the following providers on your designated Care Team at your next follow up:  Dr. Toribio Fuel Dr. Ezra Shuck Dr. Ria Commander Dr. Odis Brownie Greig Mosses, NP Caffie Shed, [place] [facility] [place], [place] Beckey Coe, NP [place] Lee, NP Tinnie Redman, PharmD   Please be sure to bring in all your medications bottles to every appointment.   Need to Contact Us :  If you have any questions or concerns before your next appointment please send us  a message through [facility] or call our office at [phone].    TO LEAVE A MESSAGE FOR THE NURSE SELECT OPTION 2, PLEASE LEAVE A MESSAGE INCLUDING: YOUR NAME DATE OF BIRTH CALL BACK NUMBER REASON FOR CALL**this is important as we prioritize the call backs  YOU WILL RECEIVE A CALL BACK THE SAME DAY AS LONG AS YOU CALL BEFORE 4:00 PM

## 2023-08-02 ENCOUNTER — Ambulatory Visit: Admitting: Pharmacist

## 2023-08-02 ENCOUNTER — Telehealth (HOSPITAL_COMMUNITY): Payer: Self-pay

## 2023-08-02 ENCOUNTER — Ambulatory Visit (HOSPITAL_COMMUNITY): Payer: Self-pay | Admitting: Family Medicine

## 2023-08-02 DIAGNOSIS — I5022 Chronic systolic (congestive) heart failure: Secondary | ICD-10-CM

## 2023-08-02 NOTE — Addendum Note (Signed)
 Addended by: Aasim Restivo D on: 08/02/2023 01:30 PM   Modules accepted: Orders

## 2023-08-02 NOTE — Telephone Encounter (Signed)
 Pt's repeat lab appointment scheduled and lab orders placed. Pt aware, agreeable, and verbalized understanding.

## 2023-08-03 ENCOUNTER — Telehealth: Payer: Self-pay

## 2023-08-03 ENCOUNTER — Other Ambulatory Visit (HOSPITAL_COMMUNITY): Payer: Self-pay

## 2023-08-03 ENCOUNTER — Telehealth (HOSPITAL_COMMUNITY): Payer: Self-pay | Admitting: Pharmacy Technician

## 2023-08-03 ENCOUNTER — Other Ambulatory Visit (HOSPITAL_COMMUNITY)

## 2023-08-03 ENCOUNTER — Telehealth: Payer: Self-pay | Admitting: Pharmacist

## 2023-08-03 ENCOUNTER — Ambulatory Visit: Attending: Cardiology | Admitting: Pharmacist

## 2023-08-03 ENCOUNTER — Encounter: Payer: Self-pay | Admitting: Pharmacist

## 2023-08-03 DIAGNOSIS — Z8673 Personal history of transient ischemic attack (TIA), and cerebral infarction without residual deficits: Secondary | ICD-10-CM | POA: Diagnosis present

## 2023-08-03 DIAGNOSIS — G4733 Obstructive sleep apnea (adult) (pediatric): Secondary | ICD-10-CM | POA: Insufficient documentation

## 2023-08-03 MED ORDER — MOUNJARO 2.5 MG/0.5ML ~~LOC~~ SOAJ: 2.5000 mg | SUBCUTANEOUS | 0 refills | Status: DC

## 2023-08-03 NOTE — Telephone Encounter (Signed)
 Auth Submission: NO AUTH NEEDED Site of care: Site of care: [facility] Payer: Medicare A/B, AARP supp Medication & CPT/J Code(s) submitted: Leqvio  (Inclisiran) J1306 Diagnosis Code: E78.5 Route of submission (phone, fax, portal):  Phone # Fax # Auth type: Buy/Bill HB Units/visits requested: 284MG  x 6 mths Reference number:  Approval from: 08/03/23 to 03/03/24    Medicare A/B will cover 80%, AARP Supp will cover remaining 20%. Med will be covered at 100%.    Colbey Wirtanen, CPhT [facility] [phone]

## 2023-08-03 NOTE — Telephone Encounter (Signed)
 Patient is currently using a Humalog  sliding scale as follows:  100-140 mg/dL: 4 units  859-799 mg/dL: 6 units  >799 mg/dL: 10 units  Lantus  dosing follows a sliding scale as well:  >220 mg/dL: 22 units  <819 mg/dL: 18 units  A prescription for Mounjaro 2.5 mg will be sent to the patient's preferred pharmacy. The patient has been educated to reduce the basal insulin  (Lantus ) dose by 20% based on blood glucose readings. The plan is to gradually titrate down insulin  while titrating up GLP-1 therapy (Mounjaro) as tolerated and guided by glycemic response.

## 2023-08-03 NOTE — Telephone Encounter (Signed)
 Pharmacy Patient Advocate Encounter  Insurance verification completed.   The patient is insured through [employer] claim for [employer]. Currently a quantity of 2 ML is a 28 day supply and the co-pay is $0 .   This test claim was processed through [facility]- copay amounts may vary at other pharmacies due to pharmacy/plan contracts, or as the patient moves through the different stages of their insurance plan.

## 2023-08-03 NOTE — Patient Instructions (Signed)
 We will do coverage assessment for Mounjaro. Will call you in 1-2 days with update.

## 2023-08-03 NOTE — Progress Notes (Signed)
 Patient ID: NAYVIE LIPS                 DOB: 07/04/1945                    [name]: [id number]     HPI: ADREAN FINDLAY is a 78 y.o. female patient referred to pharmacy clinic by Dr.Mclean to initiate GLP1-RA therapy. PMH is significant for CAD s/p CABG, diastolic CHF,cerebrovascular disease with history of CVA , and obesity. She had CABG x 5 in 12/11.  Prior to the CABG she had multiple PCIs.  She had a CVA in 2/14 that presented as visual loss.  She had a left carotid stent in 2/15. Most recent BMI 43.78 kg/m .  Baseline weight and BMI: 220 lbs 40.16 kg/m  Current weight and BMI: 231 lbs 43.78 kg/m  Current meds that affect weight: Lantus  18-22 at night and Humalog  4-12 units before meals  Goal weight:<200 lbs   Diet:  Breakfast: skips -black coffee,once every 6 months go out with family only eats eggs and toast  Lunch: skips  Dinner: fish and sea food- broiled or baked and salads with light dressing Drink- diet soda, unsweetened tea and coffee  Snacks: none  Eats out - once a week    Exercise:  Stationary stapes - 1 mile takes 30 min  Chair exercise - 15-20 min every other day  Some days uses resistance exercise  Family History:   Social History:  Alcohol : none  Smoking: never  Labs: Lab Results  Component Value Date   HGBA1C 6.1 (H) 10/22/2022    Wt Readings from Last 1 Encounters:  08/01/23 231 lb 9.6 oz (105.1 kg)    BP Readings from Last 1 Encounters:  08/01/23 (!) 160/80   Pulse Readings from Last 1 Encounters:  08/01/23 (!) 53       Component Value Date/Time   CHOL 81 10/30/2022 1357   CHOL 164 11/13/2014 1151   TRIG 36 10/30/2022 1357   HDL 49 10/30/2022 1357   HDL 56 11/13/2014 1151   CHOLHDL 1.7 10/30/2022 1357   VLDL 7 10/30/2022 1357   LDLCALC 25 10/30/2022 1357   LDLCALC 75 11/13/2014 1151   LDLDIRECT 156.7 02/06/2014 1014    Past Medical History:  Diagnosis Date   Anemia    hx   Anxiety    Asthma    Basal cell carcinoma 05/2014    left shoulder   Bundle branch block, left    chronic/notes 07/18/2013   CHF (congestive heart failure) (HCC)    Chronic insomnia 05/06/2015   Chronic kidney disease    frequency, sees dr montie dakins every 4 to 6 months (01/16/2018)   Chronic lower back pain    Claustrophobia    Common migraine 05/14/2014   Coronary artery disease    MI in 2001, 2002, 2006, 2011, 2014   Depression    Diabetic peripheral neuropathy (HCC) 01/12/2019   GERD (gastroesophageal reflux disease)    H/O hiatal hernia    Headache    at least 2/month (01/16/2018)   Heart murmur    Hyperlipidemia    Hypertension    Memory change 05/14/2014   Migraine    5-6/year  (01/16/2018)   Obesity 01-2010   Obstructive sleep apnea    can't wear machine; I have claustrophobia (01/16/2018), states she had a 2nd sleep study and does not have sleep apnea, her O2 decreases and now is on 2 L of O2 at  night.   On home oxygen  therapy    2L at night and prn during daytime (01/16/2018)   Osteoarthritis    knees and hands (01/16/2018)   Peripheral vascular disease (HCC)    ? numbness, tingling arms and legs   PONV (postoperative nausea and vomiting)    Stroke (HCC)    2014, 2015, 2016   Type II diabetes mellitus (HCC)    Ventral hernia    hx of    Current Outpatient Medications on File Prior to Visit  Medication Sig Dispense Refill   acetaminophen  (TYLENOL ) 500 MG tablet Take 500 mg by mouth every 6 (six) hours as needed for mild pain, moderate pain, fever or headache.     albuterol  (VENTOLIN  HFA) 108 (90 Base) MCG/ACT inhaler Inhale 2 puffs into the lungs every 4 (four) hours as needed for wheezing or shortness of breath.      ALPRAZolam  (XANAX ) 0.5 MG tablet Take 0.5-1 mg by mouth See admin instructions. Take 0.5 mg  morning at lunch and 1 mg at bedtime     B Complex-C (B-COMPLEX WITH VITAMIN C) tablet Take 1 tablet by mouth in the morning.     baclofen (LIORESAL) 10 MG tablet Take 10 mg by mouth at bedtime.      bisacodyl  (DULCOLAX) 5 MG EC tablet Take 5 mg by mouth daily as needed for moderate constipation.     buPROPion  (WELLBUTRIN  XL) 300 MG 24 hr tablet Take 300 mg by mouth daily.     carboxymethylcellulose (REFRESH PLUS) 0.5 % SOLN Place 1 drop into both eyes in the morning, at noon, and at bedtime.     Cholecalciferol  (VITAMIN D3) 50 MCG (2000 UT) capsule Take 2,000 Units by mouth daily in the afternoon.     clopidogrel  (PLAVIX ) 75 MG tablet Take 75 mg by mouth every morning.     DULoxetine  (CYMBALTA ) 30 MG capsule Take 1 capsule (30 mg total) by mouth daily. 30 capsule 0   ELIQUIS  5 MG TABS tablet TAKE ONE (1) TABLET BY MOUTH TWO (2) TIMES DAILY 180 tablet 3   ezetimibe  (ZETIA ) 10 MG tablet Take 1 tablet (10 mg total) by mouth at bedtime. 90 tablet 3   fluocinonide cream (LIDEX) 0.05 % Apply 1 application. topically 2 (two) times daily as needed (irritation).     Fluticasone-Salmeterol (ADVAIR) 250-50 MCG/DOSE AEPB Inhale 1 puff into the lungs daily.     gabapentin  (NEURONTIN ) 600 MG tablet Take 600-1,200 mg by mouth See admin instructions. Take 1 tablet (600 mg) by mouth in the morning & take 2 tablets (1200 mg) by mouth at bedtime     inclisiran (LEQVIO ) 284 MG/1.5ML SOSY injection Inject 284 mg into the skin every 6 (six) months.     Insulin  Glargine (LANTUS  SOLOSTAR) 100 UNIT/ML Solostar Pen Inject 0-8 Units into the skin at bedtime. Per sliding scale     insulin  lispro (HUMALOG ) 100 UNIT/ML KwikPen Inject 4-6 Units into the skin See admin instructions. Sliding scale Inject 8 units subcutaneously prior to breakfast and supper; add 4 units for CBG >200     loperamide  (IMODIUM  A-D) 2 MG tablet Take 2 mg by mouth 4 (four) times daily as needed for diarrhea or loose stools.     Multiple Vitamin (MULTIVITAMIN WITH MINERALS) TABS tablet Take 1 tablet by mouth every morning. Centrum - Women over 50     Multiple Vitamins-Minerals (OCUVITE EYE HEALTH FORMULA) CAPS Take 1 capsule by mouth every  morning.     nitroGLYCERIN  (  NITROSTAT ) 0.3 MG SL tablet DISSOLVE 1 TABLET UNDER TONGUE EVERY 5 MINUTES FOR UP TO 3 DOSES AS NEEDED FOR CHEST PAIN 100 tablet 0   ondansetron  (ZOFRAN -ODT) 4 MG disintegrating tablet Take 4 mg by mouth 2 (two) times daily.     OXYGEN  Inhale 2 L into the lungs at bedtime. Additional during the day     pantoprazole  (PROTONIX ) 40 MG tablet Take 1 tablet (40 mg total) by mouth daily. 30 tablet 0   potassium chloride  (KLOR-CON  M) 10 MEQ tablet Patient takes 1 tablet by mouth every 3rd day as directed 15 tablet 5   pyridOXINE (VITAMIN B6) 100 MG tablet Take 100 mg by mouth every morning.     ranolazine  (RANEXA ) 1000 MG SR tablet Take 1 tablet (1,000 mg total) by mouth 2 (two) times daily. 180 tablet 3   rosuvastatin  (CRESTOR ) 40 MG tablet Take 1 tablet (40 mg total) by mouth daily. 90 tablet 3   sacubitril -valsartan  (ENTRESTO ) 97-103 MG Take 1 tablet by mouth 2 (two) times daily. 180 tablet 3   Semaglutide , 2 MG/DOSE, (OZEMPIC , 2 MG/DOSE,) 8 MG/3ML SOPN Inject 2 mg into the skin once a week. 3 mL 11   spironolactone  (ALDACTONE ) 25 MG tablet Take 1 tablet (25 mg total) by mouth daily. 90 tablet 3   torsemide  (DEMADEX ) 20 MG tablet Take 1 tablet by mouth every 3rd day as directed. 15 tablet 5   traZODone  (DESYREL ) 50 MG tablet Take 100-150 mg by mouth at bedtime.     vitamin B-12 (CYANOCOBALAMIN ) 1000 MCG tablet Take 1,000 mcg by mouth every morning.      zolpidem  (AMBIEN ) 10 MG tablet Take 10 mg by mouth at bedtime.     No current facility-administered medications on file prior to visit.    Allergies  Allergen Reactions   Amoxicillin Shortness Of Breath and Rash   Brilinta  [Ticagrelor ] Shortness Of Breath   Erythromycin Shortness Of Breath, Other (See Comments) and Hives    Trouble swallowing   Flagyl [Metronidazole] Shortness Of Breath and Palpitations   Penicillins Hives, Shortness Of Breath, Rash and Other (See Comments)    Has patient had a PCN reaction causing  immediate rash, facial/tongue/throat swelling, SOB or lightheadedness with hypotension: Yes Has patient had a PCN reaction causing severe rash involving mucus membranes or skin necrosis: No Has patient had a PCN reaction that required hospitalization: Yes Has patient had a PCN reaction occurring within the last 10 years: No If all of the above answers are NO, then may proceed with Cephalosporin use.    Isosorbide  Mononitrate [Isosorbide  Nitrate] Other (See Comments)    Joint aches, muscles hurt, difficult to walk    Jardiance [Empagliflozin] Other (See Comments)    Nausea, joint aches, muscles aches   Metformin And Related Other (See Comments)    Stomach pain, cold sweats, joint pain, burred vision, dizziness   Tape Other (See Comments)    Skin pulls off with certain types Plastic tape causes skin to rip if left on for long periods of time   Cefotaxime     Other Reaction(s): Unknown     Assessment/Plan:  1. Weight loss/ T2DM -  previously patient had tried Ozempic  but due to procedure it was stopped and it never restarted. Patient states she was little intolerant during initial phase. Now just on insulin  and following healthy diet and doing regular exercise and trying to loose weight. Patient has not met goal of at least 5% of body weight loss with  comprehensive lifestyle modifications alone in the past 3-6 months. Pharmacotherapy is appropriate to pursue as augmentation. Will start Mounjaro coverage assessment. BG under controlled on Basal bolus insulin  - use sliding scale. Confirmed patient has no personal or family history of medullary thyroid  carcinoma (MTC) or Multiple Endocrine Neoplasia syndrome type 2 (MEN 2). Injection technique reviewed at today's visit.  Advised patient on common side effects including nausea, diarrhea, dyspepsia, decreased appetite, and fatigue. Counseled patient on reducing meal size and how to titrate medication to minimize side effects. Counseled patient to  call if intolerable side effects or if experiencing dehydration, abdominal pain, or dizziness. Along with pharmacotherapy, the patient will follow dietary modifications and aim for at least 150 minutes of moderate-intensity exercise per week, plus resistance training twice a week (as recommended by the American Heart Association). This resistance training--such as weightlifting, bodyweight exercises, or using resistance bands, adapted to the patient's ability--will help prevent muscle loss   Follow up in 1-2 days regarding coverage of Mounjaro . If therapy is initiated, phone follow-ups will be conducted every 4 weeks for dose titration until the patient reaches the effective therapeutic dose and target weight.  Robbi Blanch, Pharm.D [place] [facility] [address], [place], [place] [address] Phone: [phone]; Fax: [phone]

## 2023-08-04 ENCOUNTER — Telehealth (HOSPITAL_COMMUNITY): Payer: Self-pay | Admitting: Adult Health

## 2023-08-04 LAB — BASIC METABOLIC PANEL WITH GFR
BUN/Creatinine Ratio: 20 (ref 12–28)
BUN: 20 mg/dL (ref 8–27)
CO2: 22 mmol/L (ref 20–29)
Calcium: 9.8 mg/dL (ref 8.7–10.3)
Chloride: 107 mmol/L — ABNORMAL HIGH (ref 96–106)
Creatinine, Ser: 1.01 mg/dL — ABNORMAL HIGH (ref 0.57–1.00)
Glucose: 172 mg/dL — ABNORMAL HIGH (ref 70–99)
Potassium: 4.6 mmol/L (ref 3.5–5.2)
Sodium: 145 mmol/L — ABNORMAL HIGH (ref 134–144)
eGFR: 58 mL/min/{1.73_m2} — ABNORMAL LOW (ref >59)

## 2023-08-04 NOTE — Telephone Encounter (Signed)
  Cardiomems Remote Monitoring  S/P Cardiomems Implant   PAD Goal: 10  Most recent reading: 22. Suggestive of fluid accumulation   Recommended changes: She will need to take Torsemide  20 mg daily x 3 days then back to 20 mg every 3rd day. [name] [name] verbalized understanding and appreciated the call.    I continue to review and analyze the patients PA pressures weekly (and more often as needed) to bring PA pressures within the optimal range.     Helen Winterhalter NP-C  12:42 PM

## 2023-08-08 ENCOUNTER — Other Ambulatory Visit (HOSPITAL_COMMUNITY): Payer: Self-pay | Admitting: Cardiology

## 2023-08-08 ENCOUNTER — Telehealth (HOSPITAL_COMMUNITY): Payer: Self-pay | Admitting: Adult Health

## 2023-08-08 DIAGNOSIS — I480 Paroxysmal atrial fibrillation: Secondary | ICD-10-CM

## 2023-08-08 MED ORDER — TORSEMIDE 20 MG PO TABS: 20.0000 mg | ORAL_TABLET | Freq: Every day | ORAL | 3 refills | Status: AC

## 2023-08-08 NOTE — Telephone Encounter (Signed)
  Cardiomems Remote Monitoring  S/P Cardiomems Implant   PAD Goal: 10 Most recent reading: 18   Recommended changes: Please call and instruct to take torsemide  40 mg daily x 3 days then 20 mg daily. Instruct to a avoid high sodium diet.   I continue to review and analyze the patients PA pressures weekly (and more often as needed) to bring PA pressures within the optimal range.    Sohaib Vereen NP-C  9:59 AM

## 2023-08-08 NOTE — Telephone Encounter (Signed)
Patient advised and verbalized understanding. Med list updated to reflect changes.  ? ?

## 2023-08-08 NOTE — Addendum Note (Signed)
 Addended by: ALVAN COPA R on: 08/08/2023 10:51 AM   Modules accepted: Orders

## 2023-08-10 ENCOUNTER — Other Ambulatory Visit (HOSPITAL_COMMUNITY): Payer: Self-pay | Admitting: *Deleted

## 2023-08-11 ENCOUNTER — Ambulatory Visit (HOSPITAL_COMMUNITY)
Admission: RE | Admit: 2023-08-11 | Discharge: 2023-08-11 | Disposition: A | Discharge status: Home / Self Care | Payer: Medicare Other | Source: Ambulatory Visit | Attending: Cardiology | Admitting: Cardiology

## 2023-08-11 DIAGNOSIS — E785 Hyperlipidemia, unspecified: Secondary | ICD-10-CM | POA: Insufficient documentation

## 2023-08-11 MED ORDER — INCLISIRAN SODIUM 284 MG/1.5ML ~~LOC~~ SOSY
PREFILLED_SYRINGE | SUBCUTANEOUS | Status: AC
  Filled 2023-08-11: qty 1.5

## 2023-08-11 MED ORDER — INCLISIRAN SODIUM 284 MG/1.5ML ~~LOC~~ SOSY
284.0000 mg | PREFILLED_SYRINGE | Freq: Once | SUBCUTANEOUS | Status: AC
  Administered 2023-08-11: 284 mg via SUBCUTANEOUS

## 2023-08-22 ENCOUNTER — Other Ambulatory Visit: Payer: Self-pay | Admitting: Cardiology

## 2023-08-24 ENCOUNTER — Telehealth: Payer: Self-pay | Admitting: Pharmacist

## 2023-08-24 NOTE — Telephone Encounter (Signed)
 Patient reports she has been tolerating 2.5 Mounjaro  dose well and she only  been using 4 units of Humalog  and 8 units of Lantus . FBG ~120 and post meal ~ 160. Reports this range is much better than earlier. Will start taking 5 mg dose of Mounjaro  from July 28 and will adjust the insulin  dose according to her BG level.

## 2023-09-30 MED ORDER — MOUNJARO 7.5 MG/0.5ML ~~LOC~~ SOAJ: 7.5000 mg | SUBCUTANEOUS | 0 refills | Status: DC

## 2023-09-30 NOTE — Addendum Note (Signed)
 Addended by: Neamiah Sciarra K on: 09/30/2023 10:05 AM   Modules accepted: Orders

## 2023-09-30 NOTE — Telephone Encounter (Signed)
 Call to titrate Mounjaro  dose. Doing chair exercises but not so much of walking due to not having lower leg strength. Appetite is small and tolerates 5 mg dose well. Will up the dose to 7.5 mg Mounjaro  once a week. F/u due in 4 weeks for further dose titration

## 2023-10-18 ENCOUNTER — Other Ambulatory Visit (HOSPITAL_COMMUNITY): Payer: Self-pay | Admitting: Cardiology

## 2023-10-19 ENCOUNTER — Encounter (HOSPITAL_COMMUNITY): Payer: Self-pay | Admitting: Cardiology

## 2023-10-19 ENCOUNTER — Ambulatory Visit (HOSPITAL_COMMUNITY)
Admission: RE | Admit: 2023-10-19 | Discharge: 2023-10-19 | Disposition: A | Discharge status: Home / Self Care | Source: Ambulatory Visit | Attending: Cardiology | Admitting: Cardiology

## 2023-10-19 ENCOUNTER — Ambulatory Visit (HOSPITAL_COMMUNITY): Payer: Self-pay | Admitting: Cardiology

## 2023-10-19 VITALS — BP 152/72 | HR 71 | Ht 61.0 in | Wt 229.6 lb

## 2023-10-19 DIAGNOSIS — Z7985 Long-term (current) use of injectable non-insulin antidiabetic drugs: Secondary | ICD-10-CM | POA: Diagnosis not present

## 2023-10-19 DIAGNOSIS — I447 Left bundle-branch block, unspecified: Secondary | ICD-10-CM | POA: Diagnosis not present

## 2023-10-19 DIAGNOSIS — Z794 Long term (current) use of insulin: Secondary | ICD-10-CM | POA: Insufficient documentation

## 2023-10-19 DIAGNOSIS — I130 Hypertensive heart and chronic kidney disease with heart failure and stage 1 through stage 4 chronic kidney disease, or unspecified chronic kidney disease: Secondary | ICD-10-CM | POA: Insufficient documentation

## 2023-10-19 DIAGNOSIS — I5022 Chronic systolic (congestive) heart failure: Secondary | ICD-10-CM | POA: Diagnosis not present

## 2023-10-19 DIAGNOSIS — Z9181 History of falling: Secondary | ICD-10-CM | POA: Diagnosis not present

## 2023-10-19 DIAGNOSIS — E785 Hyperlipidemia, unspecified: Secondary | ICD-10-CM | POA: Insufficient documentation

## 2023-10-19 DIAGNOSIS — I2510 Atherosclerotic heart disease of native coronary artery without angina pectoris: Secondary | ICD-10-CM | POA: Diagnosis not present

## 2023-10-19 DIAGNOSIS — Z951 Presence of aortocoronary bypass graft: Secondary | ICD-10-CM | POA: Diagnosis not present

## 2023-10-19 DIAGNOSIS — Z79899 Other long term (current) drug therapy: Secondary | ICD-10-CM | POA: Diagnosis not present

## 2023-10-19 DIAGNOSIS — E662 Morbid (severe) obesity with alveolar hypoventilation: Secondary | ICD-10-CM | POA: Diagnosis not present

## 2023-10-19 DIAGNOSIS — N189 Chronic kidney disease, unspecified: Secondary | ICD-10-CM | POA: Insufficient documentation

## 2023-10-19 DIAGNOSIS — Z6841 Body Mass Index (BMI) 40.0 and over, adult: Secondary | ICD-10-CM | POA: Diagnosis not present

## 2023-10-19 DIAGNOSIS — I5042 Chronic combined systolic (congestive) and diastolic (congestive) heart failure: Secondary | ICD-10-CM | POA: Diagnosis not present

## 2023-10-19 DIAGNOSIS — I429 Cardiomyopathy, unspecified: Secondary | ICD-10-CM | POA: Diagnosis not present

## 2023-10-19 DIAGNOSIS — Z955 Presence of coronary angioplasty implant and graft: Secondary | ICD-10-CM | POA: Insufficient documentation

## 2023-10-19 DIAGNOSIS — R42 Dizziness and giddiness: Secondary | ICD-10-CM | POA: Insufficient documentation

## 2023-10-19 DIAGNOSIS — I25119 Atherosclerotic heart disease of native coronary artery with unspecified angina pectoris: Secondary | ICD-10-CM | POA: Diagnosis not present

## 2023-10-19 DIAGNOSIS — I679 Cerebrovascular disease, unspecified: Secondary | ICD-10-CM | POA: Insufficient documentation

## 2023-10-19 DIAGNOSIS — I480 Paroxysmal atrial fibrillation: Secondary | ICD-10-CM | POA: Diagnosis not present

## 2023-10-19 DIAGNOSIS — I058 Other rheumatic mitral valve diseases: Secondary | ICD-10-CM | POA: Insufficient documentation

## 2023-10-19 DIAGNOSIS — Z7901 Long term (current) use of anticoagulants: Secondary | ICD-10-CM | POA: Insufficient documentation

## 2023-10-19 DIAGNOSIS — R5383 Other fatigue: Secondary | ICD-10-CM | POA: Diagnosis not present

## 2023-10-19 DIAGNOSIS — Z8673 Personal history of transient ischemic attack (TIA), and cerebral infarction without residual deficits: Secondary | ICD-10-CM | POA: Insufficient documentation

## 2023-10-19 DIAGNOSIS — I5032 Chronic diastolic (congestive) heart failure: Secondary | ICD-10-CM | POA: Diagnosis present

## 2023-10-19 DIAGNOSIS — Z7902 Long term (current) use of antithrombotics/antiplatelets: Secondary | ICD-10-CM | POA: Insufficient documentation

## 2023-10-19 DIAGNOSIS — E1122 Type 2 diabetes mellitus with diabetic chronic kidney disease: Secondary | ICD-10-CM | POA: Diagnosis not present

## 2023-10-19 LAB — CBC
HCT: 45.3 % (ref 36.0–46.0)
Hemoglobin: 15.1 g/dL — ABNORMAL HIGH (ref 12.0–15.0)
MCH: 31.9 pg (ref 26.0–34.0)
MCHC: 33.3 g/dL (ref 30.0–36.0)
MCV: 95.6 fL (ref 80.0–100.0)
Platelets: 128 K/uL — ABNORMAL LOW (ref 150–400)
RBC: 4.74 MIL/uL (ref 3.87–5.11)
RDW: 13.1 % (ref 11.5–15.5)
WBC: 8.6 K/uL (ref 4.0–10.5)
nRBC: 0 % (ref 0.0–0.2)

## 2023-10-19 LAB — LIPID PANEL
Cholesterol: 97 mg/dL (ref 0–200)
HDL: 58 mg/dL (ref >40)
LDL Cholesterol: 26 mg/dL (ref 0–99)
Total CHOL/HDL Ratio: 1.7 ratio
Triglycerides: 63 mg/dL (ref <150)
VLDL: 13 mg/dL (ref 0–40)

## 2023-10-19 LAB — BASIC METABOLIC PANEL WITH GFR
Anion gap: 14 (ref 5–15)
BUN: 12 mg/dL (ref 8–23)
CO2: 29 mmol/L (ref 22–32)
Calcium: 9.8 mg/dL (ref 8.9–10.3)
Chloride: 99 mmol/L (ref 98–111)
Creatinine, Ser: 1.11 mg/dL — ABNORMAL HIGH (ref 0.44–1.00)
GFR, Estimated: 52 mL/min — ABNORMAL LOW (ref >60)
Glucose, Bld: 167 mg/dL — ABNORMAL HIGH (ref 70–99)
Potassium: 4.8 mmol/L (ref 3.5–5.1)
Sodium: 142 mmol/L (ref 135–145)

## 2023-10-19 LAB — BRAIN NATRIURETIC PEPTIDE: B Natriuretic Peptide: 102.7 pg/mL — ABNORMAL HIGH (ref 0.0–100.0)

## 2023-10-19 LAB — TSH: TSH: 1.537 u[IU]/mL (ref 0.350–4.500)

## 2023-10-19 MED ORDER — SACUBITRIL-VALSARTAN 49-51 MG PO TABS: 1.0000 | ORAL_TABLET | Freq: Two times a day (BID) | ORAL | 5 refills | Status: AC

## 2023-10-19 NOTE — Progress Notes (Signed)
 Date:  10/19/2023   ID:  Jon JINNY Ferretti, DOB 1946-01-14, [name] [id number]   Provider location: [facility] Type of Visit: Established patient   PCP:  Windy Coy, MD  HF Cardiologist:  Dr. Rolan  Chief complaint: CHF   History of Present Illness: [name] [name] is a 78 y.o. female who has a history of CAD s/p CABG, diastolic CHF, and cerebrovascular disease with history of CVA presents for followup of CHF and diastolic CHF. She had CABG x 5 in 12/11.  Prior to the CABG she had multiple PCIs.  She had a CVA in 2/14 that presented as visual loss.  She had a left carotid stent in 2/15.    Left heart cath done in Sept. 2014. This showed patent SVG-D, LIMA-LAD, and sequential SVG-OM branches but SVG-RCA and the native RCA were both totally occluded. It was felt that her increased symptoms coincided with occlusion of SVG-RCA. She was started on Imdur  to see if this would help with the chest pain and dyspnea. However, she feels like Imdur  causes leg cramps and does not think that she can take it. So ranolazine  500 mg bid started and titrated this up to 1000 mg bid.  This helped some but not markedly. Therefore, sent to Dr. Dann to address opening RCA CTO. He was able to do this in 5/15 with 4 overlapping Xience DES.  This led to resolution of exertional chest pain.     [name] [name] was started on Brilinta  after PCI.  She became much more short of breath after starting on Brilinta . Brilinta  stopped and replaced with Plavix .  Dyspnea improved off Brilinta . Given increasing exertional dyspnea and chest pain,  RHC/LHC was done in 4/17.  This showed stable CAD with no interventional target.  Left and right heart filling pressures were not significantly increased.  Medical management.    She had an MRI/MRA in May 2018 that showed moderately severe stenosis of the supraclinoid segment of the right internal carotid artery, progressed since the 2016 MR angiogram, and severe basilar  artery stenosis.    She developed recurrent exertional chest pain and had LHC in 6/18, showing 4/5 grafts patent with patent native RCA (similar to prior cath, no changes).     Echo 1/19 with EF 55-60%, moderate diastolic dysfunction, PASP 50 mmHg, mildly dilated RV, mild AS, mild-moderate MR.    She reported increased dyspnea and chest pain and had RHC/LHC in 9/19.  She had DES to sequential SVG-OM1/PLOM. While hospitalized, she was noted to have runs of atrial fibrillation.  She was very symptomatic with the atrial fibrillation. Eliquis  and amiodarone  were started.    She was admitted 12/16-12/19/19 for cardiomems implant and RHC. She was diuresed with IV lasix  and transitioned to torsemide  100 mg BID + metolazone  once/week. DC weight: 295 lbs.   She had a prolonged hospitalization in 2/20 with acute respiratory failure and fever to 103.  She was thought to have PNA and treated with cefepime .  She was intubated.  We were also concerned for amiodarone  pulmonary toxicity with ESR 113.  Amiodarone  was stopped and she was put on prednisone . Echo in 2/20 showed EF down to 25-30% with mid-apical LV severe hypokinesis. She had AKI. Possible ITP triggered by infection, HIT negative.  ITP was treated with Solumedrol. She was discharged to SNF.    ORIF left ankle 4/20.   11/20 admission initially with concern for TIAs (staring spells), ended up thinking possible partial seizures.  Head MRI did not show CVA.  Echo in 11/20 showed EF 45-50%, moderately decreased RV systolic function, moderate RV enlargement, moderate-severe MR, moderate TR.   Patient was admitted again in 12/20 with AKI and hypotension in the setting of sinus bradycardia (HR 30s).  She was volume overloaded on exam.  Toprol  XL was stopped and HR increased to 60s.  She was diuresed with IV Lasix .  TEE in 12/20 showed EF 50-55%, septal-lateral dyssynchrony, mildly decreased RV function, moderate MR.   Atypical chest pain in 1/21, Cardiolite   showed EF 57%, fixed inferior defect (likely artifact), no ischemia.   Echo in 5/22 showed EF 60-65%, moderate LVH, normal RV, PASP 37 mmHg, 2.5 x 2.1 cm calcified mass posterior mitral annulus with mild-moderate MR => ?severe MAC but more circumscribed and prominent than in the past, mild AS mean gradient 12 mmHg and AVA 1.83 cm^2.  Echo in 11/22 showed EF 55%, normal RV, mass posterior mitral annulus, moderate MR, mild AS, IVC normal.  Cardiac MRI was done in 1/23 to investigate posterior mitral annulus mass; study showed LV EF 48%, mild LV dilation, normal RV with EF 53%, extensive MAC is likely the source of the posterior mitral annulus mass, basal-mid inferior >50% subendocardial LGE suggestive of prior inferior MI.   Follow up 2/23 she had chest tightness x 4 weeks. Arranged for [facility] which showed in-stent restenosis of pRCA, felt to be the source of her symptoms given occluded SVG-RCA. Underwent PTCA to pRCA. RHC with normal PCWP and RA pressure, mild pulmonary hypertension, preserved CO. On ASA + Plavix  + Eliquis  x 1 month (until 05/21/21), then Plavix  + Eliquis .   Echo 8/24 showed EF significantly worse, down to 30-35%, G1DD, normal RV.   With worsening dyspnea and exertional chest pain, LHC/RHC in 9/24 showed mildly elevated PCWP with normal RA pressure, mixed pulmonary venous/pulmonary arterial hypertension, and 99% in-stent restenosis in the proximal RCA, at least 2 layers of stent are present. She then had cutting balloon angioplasty to ISR ostial to distal RCA.   Echo 1/25 showed LV mildly dilated with mild LVH, EF 45-50% on my read with septal-lateral dyssynchrony consistent with IVCD, mild RV dysfunction, mild mitral stenosis mean gradient 4 mmHg, no aortic stenosis.   Zio patch 2/25 showed mostly NSR, rare PVCs, and 3 short SVT runs.  Today she returns for HF follow up. She feels fatigued and tired in general.  She has had significant orthostatic symptoms and has had falls though no  syncopal episodes.  She is generally lightheaded when she stands.  SBP at home fluctuates from 90s-160s.  She was orthostatic in the office today with SBP dropping about 16 points with lying => standing. She was symptomatic.  She uses oxygen  at night.  No chest pain.  Symptoms seem to be more fatigue than dyspnea. Weight down 2 lbs.   Cardiomems (personally reviewed): goal 10, PADP 10 today  ECG (personally reviewed): SB at 51 bpm, LBBB 174 msec  Labs (3/24): BNP 163, K 4.7, creatinine 0.9 Labs (7/24): K 4.9, creatinine 0.95, LDL 83 Labs (9/24): K 4.2, SCr 1.16, LDL 25 Labs (10/24): K 4.3, SCr 1.0  Labs (12/24): K 4.1, creatinine 0.88 Labs (2/25): K 4.9, creatinine 1.57 Labs (3/25): K 4.6, creatinine 1.56 Labs (7/25): K 4.6, creatinine 1.01   PMH: 1. Diabetic gastroparesis 2. Type II diabetes 3. HTN 4. Morbid obesity 5. CAD: s/p CABG in 12/11 after prior PCIs.  LIMA-LAD, SVG-D, seq SVG-OM1 and OM2, SVG-PDA. Adenosine  Cardiolite  (  8/14) with EF 53% and a small reversible apical defect with a medium, partially reversible inferior defect.  LHC (9/14) with patent SVG-D, LIMA-LAD (50% distal LAD), and sequential SVG-OM branches; the SVG-RCA and the native RCA were occluded.  This was managed medically initially, but with ongoing exertional chest pain, it was decided to open CTO.  Patient had CTO opening with 4 overlapping Xience DES in the RCA in 5/15.   - LHC (4/17): SVG-D patent, LIMA-LAD patent, distal LAD with several 40-50% stenoses, sequential SVG-OM1 and PLOM patent with 50% proximal stenosis (not flow limiting), SVG-RCA TO with patent RCA stents.  - LHC (6/18): 4/5 grafts patent (SVG-RCA TO, same as past), RCA stents patent => no change.  - LHC (9/19): Sequential SVG-PLOM/OM1 with 80% proximal stenosis, s/p DES.  - Cardiolite  (1/21): EF 57%, fixed inferior defect (likely artifact), no ischemia.  - LHC (3/23): severe CAD with 90% in-stent restenosis of pRCA, s/p PTCA to in-stent restenosis  of pRCA - LHC (9/24): 99% in-stent restenosis in the proximal RCA, at least 2 layers of stent are present => patient had cutting balloon angioplasty to RCA.  6. Atypical migraines 7. OHS/OSA: Intolerant of CPAP. Uses oxygen  with exertion and at night.  8. GERD with hiatal hernia 9. OA 10. HFpEF: She has a Cardiomems device.  Echo (11/13) with EF 50-55%, grade II diastolic dysfunction, mild-moderate MR.  Echo (8/14) with EF 55-60%, grade II diastolic dysfunction, mildly increased aortic valve gradient (mean 12 mmHg) but valve opens well, mild MR and mild RV dilation.  Echo (9/15) with EF 60-65%, grade II diastolic dysfunction, mild aortic stenosis, mild mitral stenosis, mild to moderate mitral regurgitation, mildly dilated RV with normal systolic function, PA systolic pressure 46 mmHg.  - RHC (4/17): mean RA 9, PA 38/15 mean 27, mean PCWP 9, CI 2.5.  - Echo (5/17): EF 55-60%, mild LVH, mildly dilated RV with low normal systolic function, moderate TR, PASP 61 mmHg - Echo (1/19): EF 55-60%, moderate diastolic dysfunction, PASP 50 mmHg, mildly dilated RV, mild AS, mild-moderate MR. - RHC (9/19): mean RA 11, PA 34/12, mean PCWP 15, CI 2.85 - Echo (3/20): EF 25-30% with mid-apical severe hypokinesis.  - Echo (11/20): EF 45-50%, moderately dilated RV with moderately decreased systolic function, moderate-severe MR, moderate TR.  - TEE (12/20): EF 50-55%, septal-lateral dyssynchrony, moderate RV dilation/mildly decreased function, peak RV-RA gradient 51 mmHg, moderate MR.  - Echo (5/22): EF 60-65%, moderate LVH, normal RV, PASP 37 mmHg, 2.5 x 2.1 cm calcified mass posterior mitral annulus with mild-moderate MR => ?severe MAC but more circumscribed and prominent than in the past, mild AS mean gradient 12 mmHg and AVA 1.83 cm^2.  - Echo (11/22): EF 55%, normal RV, mass posterior mitral annulus, moderate MR, mild AS, IVC normal. - Cardiac MRI in 1/23 showed LV EF 48%, mild LV dilation, normal RV with EF 53%,  extensive MAC is likely the source of the posterior mitral annulus mass, basal-mid inferior >50% subendocardial LGE suggestive of prior inferior MI.  - RHC (3/23): normal PCWP (14) and RA pressure (2) , mild pulmonary hypertension, preserved CO/CI (6.51/3.28). - Echo (8/24): EF 30-35%, G1DD, normal RV.  - RHC (9/24): mean RA 5, PA 59/15 mean 36, mean PCWP 22, CI 2.34, PVR 3 WU.   - Echo (1/25): LV mildly dilated with mild LVH, EF 45-50% on my read with septal-lateral dyssynchrony consistent with IVCD, mild RV dysfunction, mild mitral stenosis mean gradient 4 mmHg, no aortic stenosis.  11.  CKD 12. Chronic LBBB 13. Anxiety 14. Carotid stenosis: Followed by [facility], >80% LICA stenosis 12/14.  She had left carotid stent in 2/15. Carotid dopplers 8/15 with no significant disease. Carotid dopplers (4/16): < 40% RICA stenosis.  - Carotid dopplers (9/22): 1-39% BICA stenosis.  - Carotid dopplers (11/24): mild BICA stenosis.  15. Cerebrovascular disease: CVA 2/14 with right posterior cerebral artery territory ischemic infarction. Cerebral angiogram in 6/14 showed 70% right vertebral ostial stenosis, 60-65% LICA stenosis, > 70% proximal left posterior cerebral artery stenosis, posterior communicating artery aneurysm.  Patient has had episodes of transient expressive aphasia.  Carotid dopplers (12/14) showed >80% LICA stenosis. She had a left carotid stent 2/15.  Possible CVA in 7/15. - MRI/MRA in May 2018 that showed moderately severe stenosis of the supraclinoid segment of the right internal carotid artery, progressed since the 2016 MR angiogram and severe basilar artery stenosis.  16. Positional vertigo (suspected) 17. Palpitations: Holter (6/15) with rare PVCs/PACs.  - Event monitor (2/18): NSR, occasional PVCs - 14 day Zio (9/23) mostly NSR, rare PVCs and PACs, few short runs of SVT, no atrial fibrillation or worrisome arrhythmias. 18. Dyspnea with Brilinta . 19. Melanoma: On face, s/p excision.   20. Aortic  stenosis: Mild on 11/22 echo.  21. Posterior mitral annular mass: Cardiac MRI was done in 1/23 to investigate posterior mitral annulus mass; study showed LV EF 48%, mild LV dilation, normal RV with EF 53%, extensive MAC is likely the source of the posterior mitral annulus mass, basal-mid inferior >50% subendocardial LGE suggestive of prior inferior MI.  22. Lower extremity arterial dopplers (9/16) were normal.  23. Sleep study (4/18): No significant OSA.  24. Atrial fibrillation: Paroxysmal - Amiodarone  stopped, ?lung toxicity.  25. H/o ITP in 3/20.  26. Partial seizures 27. Mitral regurgitation: Moderate on TEE in 12/20.  - Mild to moderate on 5/22 echo.  - Moderate on 11/22 echo  Current Outpatient Medications  Medication Sig Dispense Refill   acetaminophen  (TYLENOL ) 500 MG tablet Take 500 mg by mouth every 6 (six) hours as needed for mild pain, moderate pain, fever or headache.     albuterol  (VENTOLIN  HFA) 108 (90 Base) MCG/ACT inhaler Inhale 2 puffs into the lungs every 4 (four) hours as needed for wheezing or shortness of breath.      ALPRAZolam  (XANAX ) 0.5 MG tablet Take 0.5-1 mg by mouth See admin instructions. Take 0.5 mg  morning at lunch and 1 mg at bedtime     B Complex-C (B-COMPLEX WITH VITAMIN C) tablet Take 1 tablet by mouth in the morning.     baclofen (LIORESAL) 10 MG tablet Take 10 mg by mouth at bedtime.     bisacodyl  (DULCOLAX) 5 MG EC tablet Take 5 mg by mouth daily as needed for moderate constipation.     buPROPion  (WELLBUTRIN  XL) 300 MG 24 hr tablet Take 300 mg by mouth daily.     carboxymethylcellulose (REFRESH PLUS) 0.5 % SOLN Place 1 drop into both eyes in the morning, at noon, and at bedtime.     Cholecalciferol  (VITAMIN D3) 50 MCG (2000 UT) capsule Take 2,000 Units by mouth daily in the afternoon.     clopidogrel  (PLAVIX ) 75 MG tablet Take 75 mg by mouth every morning.     cycloSPORINE (RESTASIS) 0.05 % ophthalmic emulsion Apply 1 drop to eye 2 (two) times daily.      DULoxetine  (CYMBALTA ) 30 MG capsule Take 1 capsule (30 mg total) by mouth daily. 30 capsule 0  ELIQUIS  5 MG TABS tablet TAKE ONE (1) TABLET BY MOUTH TWO (2) TIMES DAILY 180 tablet 3   ezetimibe  (ZETIA ) 10 MG tablet Take 1 tablet (10 mg total) by mouth at bedtime. 90 tablet 3   fluocinonide cream (LIDEX) 0.05 % Apply 1 application. topically 2 (two) times daily as needed (irritation).     Fluticasone-Salmeterol (ADVAIR) 250-50 MCG/DOSE AEPB Inhale 1 puff into the lungs daily.     gabapentin  (NEURONTIN ) 600 MG tablet Take 600-1,200 mg by mouth See admin instructions. Take 1 tablet (600 mg) by mouth in the morning & take 2 tablets (1200 mg) by mouth at bedtime     inclisiran (LEQVIO ) 284 MG/1.5ML SOSY injection Inject 284 mg into the skin every 6 (six) months.     Insulin  Glargine (LANTUS  SOLOSTAR) 100 UNIT/ML Solostar Pen Inject 0-8 Units into the skin at bedtime. Per sliding scale     insulin  lispro (HUMALOG ) 100 UNIT/ML KwikPen Inject 4-6 Units into the skin See admin instructions. Sliding scale Inject 8 units subcutaneously prior to breakfast and supper; add 4 units for CBG >200     loperamide  (IMODIUM  A-D) 2 MG tablet Take 2 mg by mouth 4 (four) times daily as needed for diarrhea or loose stools.     Multiple Vitamin (MULTIVITAMIN WITH MINERALS) TABS tablet Take 1 tablet by mouth every morning. Centrum - Women over 50     Multiple Vitamins-Minerals (OCUVITE EYE HEALTH FORMULA) CAPS Take 1 capsule by mouth every morning.     nitroGLYCERIN  (NITROSTAT ) 0.3 MG SL tablet DISSOLVE 1 TABLET UNDER TONGUE EVERY 5 MINUTES FOR UP TO 3 DOSES AS NEEDED FOR CHEST PAIN 100 tablet 0   ondansetron  (ZOFRAN -ODT) 4 MG disintegrating tablet Take 4 mg by mouth 2 (two) times daily.     OXYGEN  Inhale 2 L into the lungs at bedtime. Additional during the day     pantoprazole  (PROTONIX ) 40 MG tablet Take 1 tablet (40 mg total) by mouth daily. 30 tablet 0   pyridOXINE (VITAMIN B6) 100 MG tablet Take 100 mg by mouth every  morning.     ranolazine  (RANEXA ) 1000 MG SR tablet Take 1 tablet (1,000 mg total) by mouth 2 (two) times daily. 180 tablet 3   rosuvastatin  (CRESTOR ) 40 MG tablet Take 1 tablet (40 mg total) by mouth daily. 90 tablet 3   sacubitril -valsartan  (ENTRESTO ) 49-51 MG Take 1 tablet by mouth 2 (two) times daily. 60 tablet 5   spironolactone  (ALDACTONE ) 25 MG tablet Take 1 tablet (25 mg total) by mouth daily. 90 tablet 3   tirzepatide  (MOUNJARO ) 7.5 MG/0.5ML Pen Inject 7.5 mg into the skin once a week. 2 mL 0   torsemide  (DEMADEX ) 20 MG tablet Take 1 tablet (20 mg total) by mouth daily. 90 tablet 3   traZODone  (DESYREL ) 50 MG tablet Take 100-150 mg by mouth at bedtime.     vitamin B-12 (CYANOCOBALAMIN ) 1000 MCG tablet Take 1,000 mcg by mouth every morning.      zolpidem  (AMBIEN ) 10 MG tablet Take 10 mg by mouth at bedtime.     potassium chloride  (KLOR-CON  M) 10 MEQ tablet Patient takes 1 tablet by mouth every 3rd day as directed (Patient not taking: Reported on 10/19/2023) 15 tablet 5   No current facility-administered medications for this encounter.   Allergies:   Amoxicillin, Brilinta  [ticagrelor ], Erythromycin, Flagyl [metronidazole], Penicillins, Isosorbide  mononitrate [isosorbide  nitrate], Jardiance [empagliflozin], Metformin and related, Tape, and Cefotaxime   Social History:  The patient  reports that she has  never smoked. She has never used smokeless tobacco. She reports that she does not drink alcohol  and does not use drugs.   Family History:  The patient's family history includes AAA (abdominal aortic aneurysm) in her father and mother; Cancer in her sister; Deep vein thrombosis in her father and mother; Dementia in her maternal grandmother and mother; Diabetes in her father, paternal aunt, paternal grandmother, paternal uncle, and paternal uncle; Heart attack in her father; Heart attack (age of onset: 24) in her paternal grandfather; Heart disease in her father and paternal uncle; Hyperlipidemia  in her father; Hypertension in her father, mother, and sister; Stroke in her maternal grandmother, paternal grandmother, and paternal uncle.   ROS:  Please see the history of present illness.   All other systems are personally reviewed and negative.   Recent Labs: 10/22/2022: ALT 20 10/25/2022: Magnesium  2.2 10/19/2023: B Natriuretic Peptide 102.7; BUN 12; Creatinine, Ser 1.11; Hemoglobin 15.1; Platelets 128; Potassium 4.8; Sodium 142; TSH 1.537  Personally reviewed   Wt Readings from Last 3 Encounters:  10/19/23 104.1 kg (229 lb 9.6 oz)  08/03/23 104.8 kg (231 lb)  08/01/23 105.1 kg (231 lb 9.6 oz)    BP (!) 152/72   Pulse 71   Ht 5' 1 (1.549 m)   Wt 104.1 kg (229 lb 9.6 oz)   SpO2 94%   BMI 43.38 kg/m   PHYSICAL EXAM: General: NAD, obese.  Neck: No JVD, no thyromegaly or thyroid  nodule.  Lungs: Clear to auscultation bilaterally with normal respiratory effort. CV: Nondisplaced PMI.  Heart regular S1/S2, no S3/S4, no murmur.  No peripheral edema.  No carotid bruit.  Normal pedal pulses.  Abdomen: Soft, nontender, no hepatosplenomegaly, no distention.  Skin: Intact without lesions or rashes.  Neurologic: Alert and oriented x 3.  Psych: Normal affect. Extremities: No clubbing or cyanosis.  HEENT: Normal.   ASSESSMENT AND PLAN: 1. CAD: Occluded native RCA and SVG-RCA.  Status post opening of CTO RCA with 4 overlapping Xience DES in 5/15.  DES to sequential SVG-OM1/PLOM in 9/19. LHC in 3/23 showed severe native coronary disease with 90% in-stent restenosis in the proximal RCA (patient has 2 layers of stent at this site). The sequential SVG-OM1/PLOM was patent, SVG-D was patent, and LIMA-LAD was patent. She underwent PTCA of pRCA. She has chronic angina, however exertional dyspnea has been more prominent. Recent echo showed EF further reduced, now 30-35%. Cath 9/24 showed 99% in-stent restenosis in the proximal RCA (known occluded SVG-RCA). Other coronary disease was stable. She  underwent successful cutting balloon angioplasty to ISR in the RCA w/ only 20-30% distal residual stenosis. No recent chest pain.  - Continue apixaban  and Plavix  (has been on chronic Plavix , neuro has wanted her to stay on this for cerebrovascular disease).  CBC today.  - Continue ranolazine  1000 mg bid.  - Off Imdur  with headache. - Off Toprol  XL with bradycardia.   - Continue Crestor , Leqvio  and Zetia , check lipids today (goal LDL < 55).  2. Chronic systolic CHF: Echo in 1/19 with EF 55-60%, moderate diastolic dysfunction. Cardiomems placed 01/16/18.  Echo in 3/20 in setting of severe PNA with intubation showed EF 25-30%, mid-apical LV severe hypokinesis.  Possible stress (Takotsubo-type) cardiomyopathy related to severe medical illness versus progression of CAD.  She has baseline chronic LBBB which could play a role in cardiomyopathy as well (LBBB cardiomyopathy).  Repeat echo in 11/20 with EF up to 45-50%, moderately dysfunctional RV.  TEE in 12/20 with EF 50-55% with septal-lateral  dyssynchrony and mildly dysfunctional RV.  Echo in 11/22 with EF 55% with normal RV.  cMRI in 1/23 with EF 48% and evidence for prior inferior MI.  Echo 7/24 30-35%, G1DD, normal RV.  Echo in 1/25 showed improved LV systolic function, LV mildly dilated with mild LVH, EF 45-50% with septal-lateral dyssynchrony consistent with LBBB, mild RV dysfunction, mild mitral stenosis mean gradient 4 mmHg, no aortic stenosis. Chronic NYHA class III primarily due to fatigue.  Prominent orthostatic symptoms.  She is not volume overloaded on exam or Cardiomems today.  - Continue torsemide  20 mg daily. BMET/BNP today.  - With orthostatic BP drop and symptoms, I will have her decrease Entresto  to 49/51 bid.  I will also have her wear graded compression stockings during the day.  - Continue spironolactone  25 mg daily. - Cannot tolerate SGLT2i. - Not on beta blocker due to bradycardia.  - EF is now out of range for CRT-D device (has wide  LBBB).  3. Hyperlipidemia: She remains on Zetia , Crestor  and Leqvio .  - Check lipids today.  4. Hypertension: BP fluctuates up and down, but she has significant orthostatic symptoms. She has had falls.  - Decreasing Entresto  as above and will have her use graded compression stockings.  5. Cerebrovascular disease: She has history of CVA and had left carotid stent in 2/15. Followed by [facility].  Last MRA head showed severe stenosis of supraclinoid RICA and severe basilar artery stenosis.  - Neurology would like her to continue Plavix  for this long-term despite concomitant apixaban  use.  6. OHS/OSA: Cannot tolerate CPAP.  Uses oxygen  at night.  7. Atrial fibrillation: Paroxysmal. She had suspected amiodarone  lung toxicity and is now off amiodarone . NSR on ECG today. - Continue apixaban  5 mg bid.  8. Obesity: Body mass index is 43.38 kg/m. - Continue tirzepatide .  9. Mitral regurgitation: Mild on 1/25 echo.   10. Bradycardia: Now off Toprol  XL.  Noted to have junctional beats at previous visit in addition to NSR on ECG with slow rate. Zio patch 2/25 mostly NSR, rare PVCs.  11. Mitral annular mass: This is a well-circumscribed calcified posterior mitral annulus mass noted on echoes.  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  Follow up in 2 months with APP  I spent 31 minutes reviewing records, interviewing/examining patient, and managing orders.   Signed, Ezra Shuck, MD  10/19/2023  [facility] [address] [facility] [place] [address] [phone] (office) [phone] (fax)

## 2023-10-19 NOTE — Patient Instructions (Addendum)
 Good to see you today!  DECREASE Entresto  49/51 mg Twice daily  Labs done today, your results will be available in MyChart, we will contact you for abnormal readings.   Please wear compression hose apply in the morning and off in the evening daily  Your physician recommends that you schedule a follow-up appointment in: 2 months as scheduled    If you have any questions or concerns before your next appointment please send us  a message through [facility] or call our office at [phone].    TO LEAVE A MESSAGE FOR THE NURSE SELECT OPTION 2, PLEASE LEAVE A MESSAGE INCLUDING: YOUR NAME DATE OF BIRTH CALL BACK NUMBER REASON FOR CALL**this is important as we prioritize the call backs  YOU WILL RECEIVE A CALL BACK THE SAME DAY AS LONG AS YOU CALL BEFORE 4:00 PM At the [facility], you and your health needs are our priority. As part of our continuing mission to provide you with exceptional heart care, we have created designated Provider Care Teams. These Care Teams include your primary Cardiologist (physician) and Advanced Practice Providers (APPs- Physician Assistants and Nurse Practitioners) who all work together to provide you with the care you need, when you need it.   You may see any of the following providers on your designated Care Team at your next follow up: Dr Toribio Fuel Dr Ezra Shuck Dr. Ria Commander Dr. Morene Brownie Amy Lenetta, NP Caffie Shed, [place] [facility] [place], [place] Beckey Coe, NP [place] Lee, NP Ellouise Class, NP Tinnie Redman, PharmD Jaun Bash, PharmD   Please be sure to bring in all your medications bottles to every appointment.    Thank you for choosing [facility]

## 2023-11-02 MED ORDER — MOUNJARO 10 MG/0.5ML ~~LOC~~ SOAJ: 10.0000 mg | SUBCUTANEOUS | 0 refills | Status: DC

## 2023-11-02 NOTE — Telephone Encounter (Signed)
 Pt LVM, states her weight is stable on 7.5 mg dose Mounjaro . However tolerates the current dose well so want to increase it to the next level. Prescription for Mounjaro  10 mg once week sent to the preferred pharmacy.

## 2023-11-02 NOTE — Addendum Note (Signed)
 Addended by: Tanish Prien K on: 11/02/2023 02:16 PM   Modules accepted: Orders

## 2023-12-05 MED ORDER — MOUNJARO 10 MG/0.5ML ~~LOC~~ SOAJ: 10.0000 mg | SUBCUTANEOUS | 2 refills | Status: AC

## 2023-12-05 NOTE — Addendum Note (Signed)
 Addended by: Mariaguadalupe Fialkowski K on: 12/05/2023 02:03 PM   Modules accepted: Orders

## 2023-12-15 ENCOUNTER — Encounter (HOSPITAL_BASED_OUTPATIENT_CLINIC_OR_DEPARTMENT_OTHER): Payer: Self-pay | Admitting: Family Medicine

## 2023-12-15 DIAGNOSIS — Z13820 Encounter for screening for osteoporosis: Secondary | ICD-10-CM

## 2023-12-16 ENCOUNTER — Other Ambulatory Visit (HOSPITAL_BASED_OUTPATIENT_CLINIC_OR_DEPARTMENT_OTHER): Payer: Self-pay | Admitting: Family Medicine

## 2023-12-16 ENCOUNTER — Telehealth (HOSPITAL_COMMUNITY): Payer: Self-pay

## 2023-12-16 ENCOUNTER — Encounter (HOSPITAL_BASED_OUTPATIENT_CLINIC_OR_DEPARTMENT_OTHER): Payer: Self-pay | Admitting: Family Medicine

## 2023-12-16 DIAGNOSIS — Z13820 Encounter for screening for osteoporosis: Secondary | ICD-10-CM

## 2023-12-16 DIAGNOSIS — M810 Age-related osteoporosis without current pathological fracture: Secondary | ICD-10-CM

## 2023-12-16 NOTE — Telephone Encounter (Signed)
 Called to confirm/remind patient of their appointment at the [facility] on 12/19/23.   Appointment:   [x] Confirmed  [] Left mess   [] No answer/No voice mail  [] VM Full/unable to leave message  [] Phone not in service  Patient reminded to bring all medications and/or complete list.  Confirmed patient has transportation. Gave directions, instructed to utilize valet parking.

## 2023-12-19 ENCOUNTER — Encounter (HOSPITAL_COMMUNITY): Payer: Self-pay

## 2023-12-19 ENCOUNTER — Ambulatory Visit (HOSPITAL_COMMUNITY)
Admission: RE | Admit: 2023-12-19 | Discharge: 2023-12-19 | Disposition: A | Source: Ambulatory Visit | Attending: Family Medicine

## 2023-12-19 VITALS — BP 152/84 | HR 68 | Ht 61.0 in | Wt 228.8 lb

## 2023-12-19 DIAGNOSIS — I5032 Chronic diastolic (congestive) heart failure: Secondary | ICD-10-CM | POA: Diagnosis present

## 2023-12-19 DIAGNOSIS — Z951 Presence of aortocoronary bypass graft: Secondary | ICD-10-CM | POA: Diagnosis not present

## 2023-12-19 DIAGNOSIS — R42 Dizziness and giddiness: Secondary | ICD-10-CM | POA: Insufficient documentation

## 2023-12-19 DIAGNOSIS — Z79899 Other long term (current) drug therapy: Secondary | ICD-10-CM | POA: Diagnosis not present

## 2023-12-19 DIAGNOSIS — R001 Bradycardia, unspecified: Secondary | ICD-10-CM

## 2023-12-19 DIAGNOSIS — I480 Paroxysmal atrial fibrillation: Secondary | ICD-10-CM | POA: Diagnosis not present

## 2023-12-19 DIAGNOSIS — E1169 Type 2 diabetes mellitus with other specified complication: Secondary | ICD-10-CM | POA: Diagnosis not present

## 2023-12-19 DIAGNOSIS — Z7901 Long term (current) use of anticoagulants: Secondary | ICD-10-CM | POA: Diagnosis not present

## 2023-12-19 DIAGNOSIS — E785 Hyperlipidemia, unspecified: Secondary | ICD-10-CM

## 2023-12-19 DIAGNOSIS — R519 Headache, unspecified: Secondary | ICD-10-CM | POA: Insufficient documentation

## 2023-12-19 DIAGNOSIS — I25810 Atherosclerosis of coronary artery bypass graft(s) without angina pectoris: Secondary | ICD-10-CM | POA: Diagnosis not present

## 2023-12-19 DIAGNOSIS — Z794 Long term (current) use of insulin: Secondary | ICD-10-CM | POA: Diagnosis not present

## 2023-12-19 DIAGNOSIS — I429 Cardiomyopathy, unspecified: Secondary | ICD-10-CM | POA: Insufficient documentation

## 2023-12-19 DIAGNOSIS — Z7902 Long term (current) use of antithrombotics/antiplatelets: Secondary | ICD-10-CM | POA: Insufficient documentation

## 2023-12-19 DIAGNOSIS — I110 Hypertensive heart disease with heart failure: Secondary | ICD-10-CM | POA: Diagnosis not present

## 2023-12-19 DIAGNOSIS — Z7985 Long-term (current) use of injectable non-insulin antidiabetic drugs: Secondary | ICD-10-CM | POA: Diagnosis not present

## 2023-12-19 DIAGNOSIS — I10 Essential (primary) hypertension: Secondary | ICD-10-CM

## 2023-12-19 DIAGNOSIS — I651 Occlusion and stenosis of basilar artery: Secondary | ICD-10-CM | POA: Diagnosis not present

## 2023-12-19 DIAGNOSIS — I2510 Atherosclerotic heart disease of native coronary artery without angina pectoris: Secondary | ICD-10-CM | POA: Insufficient documentation

## 2023-12-19 DIAGNOSIS — E119 Type 2 diabetes mellitus without complications: Secondary | ICD-10-CM | POA: Insufficient documentation

## 2023-12-19 DIAGNOSIS — I5042 Chronic combined systolic (congestive) and diastolic (congestive) heart failure: Secondary | ICD-10-CM

## 2023-12-19 DIAGNOSIS — Z8673 Personal history of transient ischemic attack (TIA), and cerebral infarction without residual deficits: Secondary | ICD-10-CM | POA: Insufficient documentation

## 2023-12-19 DIAGNOSIS — I25119 Atherosclerotic heart disease of native coronary artery with unspecified angina pectoris: Secondary | ICD-10-CM | POA: Diagnosis not present

## 2023-12-19 DIAGNOSIS — G4733 Obstructive sleep apnea (adult) (pediatric): Secondary | ICD-10-CM | POA: Insufficient documentation

## 2023-12-19 DIAGNOSIS — Z6841 Body Mass Index (BMI) 40.0 and over, adult: Secondary | ICD-10-CM | POA: Diagnosis not present

## 2023-12-19 DIAGNOSIS — E669 Obesity, unspecified: Secondary | ICD-10-CM | POA: Diagnosis not present

## 2023-12-19 DIAGNOSIS — I080 Rheumatic disorders of both mitral and aortic valves: Secondary | ICD-10-CM | POA: Diagnosis not present

## 2023-12-19 DIAGNOSIS — R296 Repeated falls: Secondary | ICD-10-CM

## 2023-12-19 NOTE — Progress Notes (Signed)
 Date:  12/19/2023   ID:  [name] [name], DOB January 09, 1946, [name] [id number]   Provider location: [facility] Type of Visit: Established patient   PCP:  Windy Coy, MD  HF Cardiologist:  Dr. Rolan   History of Present Illness: [name] [name] is a 78 y.o. female who has a history of CAD s/p CABG, diastolic CHF, and cerebrovascular disease with history of CVA presents for followup of CHF and diastolic CHF. She had CABG x 5 in 12/11.  Prior to the CABG she had multiple PCIs.  She had a CVA in 2/14 that presented as visual loss.  She had a left carotid stent in 2/15.    Left heart cath done in Sept. 2014. This showed patent SVG-D, LIMA-LAD, and sequential SVG-OM branches but SVG-RCA and the native RCA were both totally occluded. It was felt that her increased symptoms coincided with occlusion of SVG-RCA. She was started on Imdur  to see if this would help with the chest pain and dyspnea. However, she feels like Imdur  causes leg cramps and does not think that she can take it. So ranolazine  500 mg bid started and titrated this up to 1000 mg bid.  This helped some but not markedly. Therefore, sent to Dr. Dann to address opening RCA CTO. He was able to do this in 5/15 with 4 overlapping Xience DES.  This led to resolution of exertional chest pain.     [name] was started on Brilinta  after PCI.  She became much more short of breath after starting on Brilinta . Brilinta  stopped and replaced with Plavix .  Dyspnea improved off Brilinta . Given increasing exertional dyspnea and chest pain,  RHC/LHC was done in 4/17.  This showed stable CAD with no interventional target.  Left and right heart filling pressures were not significantly increased.  Medical management.    She had an MRI/MRA in May 2018 that showed moderately severe stenosis of the supraclinoid segment of the right internal carotid artery, progressed since the 2016 MR angiogram, and severe basilar artery stenosis.    She  developed recurrent exertional chest pain and had LHC in 6/18, showing 4/5 grafts patent with patent native RCA (similar to prior cath, no changes).     Echo 1/19 with EF 55-60%, moderate diastolic dysfunction, PASP 50 mmHg, mildly dilated RV, mild AS, mild-moderate MR.    She reported increased dyspnea and chest pain and had RHC/LHC in 9/19.  She had DES to sequential SVG-OM1/PLOM. While hospitalized, she was noted to have runs of atrial fibrillation.  She was very symptomatic with the atrial fibrillation. Eliquis  and amiodarone  were started.    She was admitted 12/16-12/19/19 for cardiomems implant and RHC. She was diuresed with IV lasix  and transitioned to torsemide  100 mg BID + metolazone  once/week. DC weight: 295 lbs.   She had a prolonged hospitalization in 2/20 with acute respiratory failure and fever to 103.  She was thought to have PNA and treated with cefepime .  She was intubated.  We were also concerned for amiodarone  pulmonary toxicity with ESR 113.  Amiodarone  was stopped and she was put on prednisone . Echo in 2/20 showed EF down to 25-30% with mid-apical LV severe hypokinesis. She had AKI. Possible ITP triggered by infection, HIT negative.  ITP was treated with Solumedrol. She was discharged to SNF.    ORIF left ankle 4/20.   11/20 admission initially with concern for TIAs (staring spells), ended up thinking possible partial seizures.  Head MRI did not  show CVA.  Echo in 11/20 showed EF 45-50%, moderately decreased RV systolic function, moderate RV enlargement, moderate-severe MR, moderate TR.   Patient was admitted again in 12/20 with AKI and hypotension in the setting of sinus bradycardia (HR 30s).  She was volume overloaded on exam.  Toprol  XL was stopped and HR increased to 60s.  She was diuresed with IV Lasix .  TEE in 12/20 showed EF 50-55%, septal-lateral dyssynchrony, mildly decreased RV function, moderate MR.   Atypical chest pain in 1/21, Cardiolite  showed EF 57%, fixed  inferior defect (likely artifact), no ischemia.   Echo in 5/22 showed EF 60-65%, moderate LVH, normal RV, PASP 37 mmHg, 2.5 x 2.1 cm calcified mass posterior mitral annulus with mild-moderate MR => ?severe MAC but more circumscribed and prominent than in the past, mild AS mean gradient 12 mmHg and AVA 1.83 cm^2.  Echo in 11/22 showed EF 55%, normal RV, mass posterior mitral annulus, moderate MR, mild AS, IVC normal.  Cardiac MRI was done in 1/23 to investigate posterior mitral annulus mass; study showed LV EF 48%, mild LV dilation, normal RV with EF 53%, extensive MAC is likely the source of the posterior mitral annulus mass, basal-mid inferior >50% subendocardial LGE suggestive of prior inferior MI.   Follow up 2/23 she had chest tightness x 4 weeks. Arranged for [facility] which showed in-stent restenosis of pRCA, felt to be the source of her symptoms given occluded SVG-RCA. Underwent PTCA to pRCA. RHC with normal PCWP and RA pressure, mild pulmonary hypertension, preserved CO. On ASA + Plavix  + Eliquis  x 1 month (until 05/21/21), then Plavix  + Eliquis .   Echo 8/24 showed EF significantly worse, down to 30-35%, G1DD, normal RV.   With worsening dyspnea and exertional chest pain, LHC/RHC in 9/24 showed mildly elevated PCWP with normal RA pressure, mixed pulmonary venous/pulmonary arterial hypertension, and 99% in-stent restenosis in the proximal RCA, at least 2 layers of stent are present. She then had cutting balloon angioplasty to ISR ostial to distal RCA.   Echo 1/25 showed LV mildly dilated with mild LVH, EF 45-50% on my read with septal-lateral dyssynchrony consistent with IVCD, mild RV dysfunction, mild mitral stenosis mean gradient 4 mmHg, no aortic stenosis.   Zio patch 2/25 showed mostly NSR, rare PVCs, and 3 short SVT runs.  Today she returns for HF follow up. Overall feeling poorly. Feels fatigued almost all of the time. SOB with minimal to no activity. She uses a rolling walker. She continues  with dizziness, has had several falls since last visit. Denies syncope. BP at home 98-201 systolic. Denies chest pain, abnormal bleeding, swelling, palpitations, or PND/orthopnea. Taking all medications.   Cardiomems (personally reviewed): goal 10, PADP 11 (12/18/23)  ECG (personally reviewed): none ordered today  Labs (3/24): BNP 163, K 4.7, creatinine 0.9 Labs (7/24): K 4.9, creatinine 0.95, LDL 83 Labs (9/24): K 4.2, SCr 1.16, LDL 25 Labs (10/24): K 4.3, SCr 1.0  Labs (12/24): K 4.1, creatinine 0.88 Labs (2/25): K 4.9, creatinine 1.57 Labs (3/25): K 4.6, creatinine 1.56 Labs (7/25): K 4.6, creatinine 1.01 Labs (9/25): K 4.8, creatinine 1.11   PMH: 1. Diabetic gastroparesis 2. Type II diabetes 3. HTN 4. Morbid obesity 5. CAD: s/p CABG in 12/11 after prior PCIs.  LIMA-LAD, SVG-D, seq SVG-OM1 and OM2, SVG-PDA. Adenosine  Cardiolite  (8/14) with EF 53% and a small reversible apical defect with a medium, partially reversible inferior defect.  LHC (9/14) with patent SVG-D, LIMA-LAD (50% distal LAD), and sequential SVG-OM branches; the  SVG-RCA and the native RCA were occluded.  This was managed medically initially, but with ongoing exertional chest pain, it was decided to open CTO.  Patient had CTO opening with 4 overlapping Xience DES in the RCA in 5/15.   - LHC (4/17): SVG-D patent, LIMA-LAD patent, distal LAD with several 40-50% stenoses, sequential SVG-OM1 and PLOM patent with 50% proximal stenosis (not flow limiting), SVG-RCA TO with patent RCA stents.  - LHC (6/18): 4/5 grafts patent (SVG-RCA TO, same as past), RCA stents patent => no change.  - LHC (9/19): Sequential SVG-PLOM/OM1 with 80% proximal stenosis, s/p DES.  - Cardiolite  (1/21): EF 57%, fixed inferior defect (likely artifact), no ischemia.  - LHC (3/23): severe CAD with 90% in-stent restenosis of pRCA, s/p PTCA to in-stent restenosis of pRCA - LHC (9/24): 99% in-stent restenosis in the proximal RCA, at least 2 layers of stent are  present => patient had cutting balloon angioplasty to RCA.  6. Atypical migraines 7. OHS/OSA: Intolerant of CPAP. Uses oxygen  with exertion and at night.  8. GERD with hiatal hernia 9. OA 10. HFpEF: She has a Cardiomems device.  Echo (11/13) with EF 50-55%, grade II diastolic dysfunction, mild-moderate MR.  Echo (8/14) with EF 55-60%, grade II diastolic dysfunction, mildly increased aortic valve gradient (mean 12 mmHg) but valve opens well, mild MR and mild RV dilation.  Echo (9/15) with EF 60-65%, grade II diastolic dysfunction, mild aortic stenosis, mild mitral stenosis, mild to moderate mitral regurgitation, mildly dilated RV with normal systolic function, PA systolic pressure 46 mmHg.  - RHC (4/17): mean RA 9, PA 38/15 mean 27, mean PCWP 9, CI 2.5.  - Echo (5/17): EF 55-60%, mild LVH, mildly dilated RV with low normal systolic function, moderate TR, PASP 61 mmHg - Echo (1/19): EF 55-60%, moderate diastolic dysfunction, PASP 50 mmHg, mildly dilated RV, mild AS, mild-moderate MR. - RHC (9/19): mean RA 11, PA 34/12, mean PCWP 15, CI 2.85 - Echo (3/20): EF 25-30% with mid-apical severe hypokinesis.  - Echo (11/20): EF 45-50%, moderately dilated RV with moderately decreased systolic function, moderate-severe MR, moderate TR.  - TEE (12/20): EF 50-55%, septal-lateral dyssynchrony, moderate RV dilation/mildly decreased function, peak RV-RA gradient 51 mmHg, moderate MR.  - Echo (5/22): EF 60-65%, moderate LVH, normal RV, PASP 37 mmHg, 2.5 x 2.1 cm calcified mass posterior mitral annulus with mild-moderate MR => ?severe MAC but more circumscribed and prominent than in the past, mild AS mean gradient 12 mmHg and AVA 1.83 cm^2.  - Echo (11/22): EF 55%, normal RV, mass posterior mitral annulus, moderate MR, mild AS, IVC normal. - Cardiac MRI in 1/23 showed LV EF 48%, mild LV dilation, normal RV with EF 53%, extensive MAC is likely the source of the posterior mitral annulus mass, basal-mid inferior >50%  subendocardial LGE suggestive of prior inferior MI.  - RHC (3/23): normal PCWP (14) and RA pressure (2) , mild pulmonary hypertension, preserved CO/CI (6.51/3.28). - Echo (8/24): EF 30-35%, G1DD, normal RV.  - RHC (9/24): mean RA 5, PA 59/15 mean 36, mean PCWP 22, CI 2.34, PVR 3 WU.   - Echo (1/25): LV mildly dilated with mild LVH, EF 45-50% on my read with septal-lateral dyssynchrony consistent with IVCD, mild RV dysfunction, mild mitral stenosis mean gradient 4 mmHg, no aortic stenosis.  11. CKD 12. Chronic LBBB 13. Anxiety 14. Carotid stenosis: Followed by [facility], >80% LICA stenosis 12/14.  She had left carotid stent in 2/15. Carotid dopplers 8/15 with no significant disease. Carotid  dopplers (4/16): < 40% RICA stenosis.  - Carotid dopplers (9/22): 1-39% BICA stenosis.  - Carotid dopplers (11/24): mild BICA stenosis.  15. Cerebrovascular disease: CVA 2/14 with right posterior cerebral artery territory ischemic infarction. Cerebral angiogram in 6/14 showed 70% right vertebral ostial stenosis, 60-65% LICA stenosis, > 70% proximal left posterior cerebral artery stenosis, posterior communicating artery aneurysm.  Patient has had episodes of transient expressive aphasia.  Carotid dopplers (12/14) showed >80% LICA stenosis. She had a left carotid stent 2/15.  Possible CVA in 7/15. - MRI/MRA in May 2018 that showed moderately severe stenosis of the supraclinoid segment of the right internal carotid artery, progressed since the 2016 MR angiogram and severe basilar artery stenosis.  16. Positional vertigo (suspected) 17. Palpitations: Holter (6/15) with rare PVCs/PACs.  - Event monitor (2/18): NSR, occasional PVCs - 14 day Zio (9/23) mostly NSR, rare PVCs and PACs, few short runs of SVT, no atrial fibrillation or worrisome arrhythmias. 18. Dyspnea with Brilinta . 19. Melanoma: On face, s/p excision.   20. Aortic stenosis: Mild on 11/22 echo.  21. Posterior mitral annular mass: Cardiac MRI was done in 1/23  to investigate posterior mitral annulus mass; study showed LV EF 48%, mild LV dilation, normal RV with EF 53%, extensive MAC is likely the source of the posterior mitral annulus mass, basal-mid inferior >50% subendocardial LGE suggestive of prior inferior MI.  22. Lower extremity arterial dopplers (9/16) were normal.  23. Sleep study (4/18): No significant OSA.  24. Atrial fibrillation: Paroxysmal - Amiodarone  stopped, ?lung toxicity.  25. H/o ITP in 3/20.  26. Partial seizures 27. Mitral regurgitation: Moderate on TEE in 12/20.  - Mild to moderate on 5/22 echo.  - Moderate on 11/22 echo  Current Outpatient Medications  Medication Sig Dispense Refill   acetaminophen  (TYLENOL ) 500 MG tablet Take 500 mg by mouth every 6 (six) hours as needed for mild pain, moderate pain, fever or headache.     albuterol  (VENTOLIN  HFA) 108 (90 Base) MCG/ACT inhaler Inhale 2 puffs into the lungs every 4 (four) hours as needed for wheezing or shortness of breath.      ALPRAZolam  (XANAX ) 0.5 MG tablet Take 0.5-1 mg by mouth See admin instructions. Take 0.5 mg  morning at lunch and 1 mg at bedtime     B Complex-C (B-COMPLEX WITH VITAMIN C) tablet Take 1 tablet by mouth in the morning.     baclofen (LIORESAL) 10 MG tablet Take 10 mg by mouth at bedtime.     bisacodyl  (DULCOLAX) 5 MG EC tablet Take 5 mg by mouth daily as needed for moderate constipation.     buPROPion  (WELLBUTRIN  XL) 300 MG 24 hr tablet Take 300 mg by mouth daily.     carboxymethylcellulose (REFRESH PLUS) 0.5 % SOLN Place 1 drop into both eyes in the morning, at noon, and at bedtime.     Cholecalciferol  (VITAMIN D3) 50 MCG (2000 UT) capsule Take 2,000 Units by mouth daily in the afternoon.     clopidogrel  (PLAVIX ) 75 MG tablet Take 75 mg by mouth every morning.     cycloSPORINE (RESTASIS) 0.05 % ophthalmic emulsion Apply 1 drop to eye 2 (two) times daily.     DULoxetine  (CYMBALTA ) 30 MG capsule Take 1 capsule (30 mg total) by mouth daily. 30 capsule 0    ELIQUIS  5 MG TABS tablet TAKE ONE (1) TABLET BY MOUTH TWO (2) TIMES DAILY 180 tablet 3   ezetimibe  (ZETIA ) 10 MG tablet Take 1 tablet (10 mg total) by  mouth at bedtime. 90 tablet 3   fluocinonide cream (LIDEX) 0.05 % Apply 1 application. topically 2 (two) times daily as needed (irritation).     Fluticasone-Salmeterol (ADVAIR) 250-50 MCG/DOSE AEPB Inhale 1 puff into the lungs daily.     gabapentin  (NEURONTIN ) 600 MG tablet Take 600-1,200 mg by mouth See admin instructions. Take 1 tablet (600 mg) by mouth in the morning & take 2 tablets (1200 mg) by mouth at bedtime     inclisiran (LEQVIO ) 284 MG/1.5ML SOSY injection Inject 284 mg into the skin every 6 (six) months.     Insulin  Glargine (LANTUS  SOLOSTAR) 100 UNIT/ML Solostar Pen Inject 0-8 Units into the skin at bedtime. Per sliding scale     insulin  lispro (HUMALOG ) 100 UNIT/ML KwikPen Inject 4-6 Units into the skin See admin instructions. Sliding scale Inject 8 units subcutaneously prior to breakfast and supper; add 4 units for CBG >200     loperamide  (IMODIUM  A-D) 2 MG tablet Take 2 mg by mouth 4 (four) times daily as needed for diarrhea or loose stools.     Multiple Vitamin (MULTIVITAMIN WITH MINERALS) TABS tablet Take 1 tablet by mouth every morning. Centrum - Women over 50     Multiple Vitamins-Minerals (OCUVITE EYE HEALTH FORMULA) CAPS Take 1 capsule by mouth every morning.     nitroGLYCERIN  (NITROSTAT ) 0.3 MG SL tablet DISSOLVE 1 TABLET UNDER TONGUE EVERY 5 MINUTES FOR UP TO 3 DOSES AS NEEDED FOR CHEST PAIN 100 tablet 0   ondansetron  (ZOFRAN -ODT) 4 MG disintegrating tablet Take 4 mg by mouth 2 (two) times daily.     OXYGEN  Inhale 2 L into the lungs at bedtime. Additional during the day     pantoprazole  (PROTONIX ) 40 MG tablet Take 1 tablet (40 mg total) by mouth daily. 30 tablet 0   pyridOXINE (VITAMIN B6) 100 MG tablet Take 100 mg by mouth every morning.     ranolazine  (RANEXA ) 1000 MG SR tablet Take 1 tablet (1,000 mg total) by mouth 2  (two) times daily. 180 tablet 3   rosuvastatin  (CRESTOR ) 40 MG tablet Take 1 tablet (40 mg total) by mouth daily. 90 tablet 3   sacubitril -valsartan  (ENTRESTO ) 49-51 MG Take 1 tablet by mouth 2 (two) times daily. 60 tablet 5   spironolactone  (ALDACTONE ) 25 MG tablet Take 1 tablet (25 mg total) by mouth daily. 90 tablet 3   tirzepatide  (MOUNJARO ) 10 MG/0.5ML Pen Inject 10 mg into the skin once a week. 2 mL 2   torsemide  (DEMADEX ) 20 MG tablet Take 1 tablet (20 mg total) by mouth daily. 90 tablet 3   traZODone  (DESYREL ) 50 MG tablet Take 100-150 mg by mouth at bedtime.     vitamin B-12 (CYANOCOBALAMIN ) 1000 MCG tablet Take 1,000 mcg by mouth every morning.      zolpidem  (AMBIEN ) 10 MG tablet Take 10 mg by mouth at bedtime.     potassium chloride  (KLOR-CON  M) 10 MEQ tablet Patient takes 1 tablet by mouth every 3rd day as directed (Patient not taking: Reported on 10/19/2023) 15 tablet 5   No current facility-administered medications for this encounter.   Allergies:   Amoxicillin, Brilinta  [ticagrelor ], Erythromycin, Flagyl [metronidazole], Penicillins, Isosorbide  mononitrate [isosorbide  nitrate], Jardiance [empagliflozin], Metformin and related, Tape, and Cefotaxime   Social History:  The patient  reports that she has never smoked. She has never used smokeless tobacco. She reports that she does not drink alcohol  and does not use drugs.   Family History:  The patient's family history includes AAA (  abdominal aortic aneurysm) in her father and mother; Cancer in her sister; Deep vein thrombosis in her father and mother; Dementia in her maternal grandmother and mother; Diabetes in her father, paternal aunt, paternal grandmother, paternal uncle, and paternal uncle; Heart attack in her father; Heart attack (age of onset: 72) in her paternal grandfather; Heart disease in her father and paternal uncle; Hyperlipidemia in her father; Hypertension in her father, mother, and sister; Stroke in her maternal  grandmother, paternal grandmother, and paternal uncle.   ROS:  Please see the history of present illness.   All other systems are personally reviewed and negative.   Wt Readings from Last 3 Encounters:  12/19/23 103.8 kg (228 lb 12.8 oz)  10/19/23 104.1 kg (229 lb 9.6 oz)  08/03/23 104.8 kg (231 lb)    BP (!) 152/84   Pulse 68   Ht 5' 1 (1.549 m)   Wt 103.8 kg (228 lb 12.8 oz)   SpO2 96%   BMI 43.23 kg/m   Physical Exam: General:  NAD. No resp difficulty. Walked into clinic. HEENT: Normal Neck: Supple. No JVD. Cor: Regular rate & rhythm. No rubs, gallops or murmurs. Lungs: Clear Abdomen: Soft, nontender, nondistended.  Extremities: No cyanosis, clubbing, rash, edema Neuro: Alert & oriented x 3, moves all 4 extremities w/o difficulty. Affect pleasant.  ASSESSMENT AND PLAN: 1. CAD: Occluded native RCA and SVG-RCA.  Status post opening of CTO RCA with 4 overlapping Xience DES in 5/15.  DES to sequential SVG-OM1/PLOM in 9/19. LHC in 3/23 showed severe native coronary disease with 90% in-stent restenosis in the proximal RCA (patient has 2 layers of stent at this site). The sequential SVG-OM1/PLOM was patent, SVG-D was patent, and LIMA-LAD was patent. She underwent PTCA of pRCA. She has chronic angina, however exertional dyspnea has been more prominent. Recent echo showed EF further reduced, now 30-35%. Cath 9/24 showed 99% in-stent restenosis in the proximal RCA (known occluded SVG-RCA). Other coronary disease was stable. She underwent successful cutting balloon angioplasty to ISR in the RCA w/ only 20-30% distal residual stenosis. No recent chest pain.  - Continue apixaban  and Plavix  (has been on chronic Plavix , neuro has wanted her to stay on this for cerebrovascular disease). - Continue ranolazine  1000 mg bid.  - Remain off Imdur  with headache. - Remain off Toprol  XL with bradycardia.   - Continue Crestor , Leqvio  and Zetia . Good lipids 9/25. 2. Chronic systolic CHF: Echo in 1/19 with  EF 55-60%, moderate diastolic dysfunction. Cardiomems placed 01/16/18.  Echo in 3/20 in setting of severe PNA with intubation showed EF 25-30%, mid-apical LV severe hypokinesis.  Possible stress (Takotsubo-type) cardiomyopathy related to severe medical illness versus progression of CAD.  She has baseline chronic LBBB which could play a role in cardiomyopathy as well (LBBB cardiomyopathy).  Repeat echo in 11/20 with EF up to 45-50%, moderately dysfunctional RV.  TEE in 12/20 with EF 50-55% with septal-lateral dyssynchrony and mildly dysfunctional RV.  Echo in 11/22 with EF 55% with normal RV.  cMRI in 1/23 with EF 48% and evidence for prior inferior MI.  Echo 7/24 30-35%, G1DD, normal RV.  Echo in 1/25 showed improved LV systolic function, LV mildly dilated with mild LVH, EF 45-50% with septal-lateral dyssynchrony consistent with LBBB, mild RV dysfunction, mild mitral stenosis mean gradient 4 mmHg, no aortic stenosis. Chronic NYHA class III, confounded by body habitus and deconditioning. Continues with orthostatic symptoms.  She is not volume overloaded on exam or Cardiomems today.  - Encouraged her  to wear compression hose daily. - Continue torsemide  20 mg daily. Labs reviewed from 10/19/23 and are stable, K 4.8, SCr 1.11 - Continue Entresto  to 49/51 bid. - Continue spironolactone  25 mg daily. - Cannot tolerate SGLT2i. - Remains off beta blocker due to bradycardia.  - EF is now out of range for CRT-D device (has wide LBBB).  3. Hyperlipidemia: She remains on Zetia , Crestor  and Leqvio . LDL 26 (9/25) 4. Hypertension: BP continues to be labile, despite de-escalating GDMT. She has orthostatic symptoms and falls (see below) - No medication changes today d/t continued orthostasis and frequency of falls.   5. Cerebrovascular disease: She has history of CVA and had left carotid stent in 2/15. Followed by [facility].  Last MRA head showed severe stenosis of supraclinoid RICA and severe basilar artery stenosis.  - Last  Neurology appointment 2/24. Suspect some of her on-going dizziness is 2/2 to brain neurovascular changes. - Remains on Plavix  and apixiban per Neurology. 6. OHS/OSA: Cannot tolerate CPAP.  Uses oxygen  at night.  7. Atrial fibrillation: Paroxysmal. She had suspected amiodarone  lung toxicity and is now off amiodarone . Regular on exam today. - Continue apixaban  5 mg bid. No bleeding issues. CBC reviewed from 10/19/23 reviewed and stable, hgb 15.1 8. Obesity: Body mass index is 43.23 kg/m. - Continue tirzepatide .  9. Mitral regurgitation: Mild on 1/25 echo.   10. Bradycardia: Off Toprol  XL.  Regular rate on exam today, HR 60's Zio patch 2/25 mostly NSR, rare PVCs.  11. Mitral annular mass: This is a well-circumscribed calcified posterior mitral annulus mass noted on echoes.  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 12. Dizziness/falls: suspect multifactorial. She has completed PT for balance training and CR. She also has diabetic retinopathy and documented bilateral visual field defects, she follows with Ophthalmology. Suspect some of her symptoms may be neurovascular related (hx of TIA) vs polypharmacy (she takes baclofen, ambien  and xanax  daily). We discussed fall precautions and importance of changing positions slowly. -  I asked her to call 911 should she ever fall and hit her head.  Follow up in 6 months with Dr. Rolan  Signed, Harlene CHRISTELLA Gainer, FNP  12/19/2023  [facility] [place] [address] [facility] [place] [address] [phone] (office) [phone] (fax)

## 2023-12-19 NOTE — Patient Instructions (Addendum)
 Thank you for coming in today  If you had labs drawn today, any labs that are abnormal the clinic will call you No news is good news  Please wear your compression hose daily, place them on as soon as you get up in the morning and remove before you go to bed at night.  Needs to get back with Neurologist and Primary Care about Dizziness   Medications: No changes  Follow up appointments:  Your physician recommends that you schedule a follow-up appointment in:  6 months With Dr. Mclean  .Please call our office to schedule the follow-up appointment in March/April 2026 for May 2026.    Do the following things EVERYDAY: Weigh yourself in the morning before breakfast. Write it down and keep it in a log. Take your medicines as prescribed Eat low salt foods--Limit salt (sodium) to 2000 mg per day.  Stay as active as you can everyday Limit all fluids for the day to less than 2 liters   At [facility], you and your health needs are our priority. As part of our continuing mission to provide you with exceptional heart care, we have created designated Provider Care Teams. These Care Teams include your primary Cardiologist (physician) and Advanced Practice Providers (APPs- Physician Assistants and Nurse Practitioners) who all work together to provide you with the care you need, when you need it.   You may see any of the following providers on your designated Care Team at your next follow up: Dr Toribio Fuel Dr Ezra Shuck Dr. Ria Gardenia Greig Lenetta, NP Caffie Shed, [place] [facility] [place], [place] Beckey Coe, NP Tinnie Redman, PharmD   Please be sure to bring in all your medications bottles to every appointment.    Thank you for choosing [facility]  If you have any questions or concerns before your next appointment please send us  a message through [place] or call our office at [phone].    TO  LEAVE A MESSAGE FOR THE NURSE SELECT OPTION 2, PLEASE LEAVE A MESSAGE INCLUDING: YOUR NAME DATE OF BIRTH CALL BACK NUMBER REASON FOR CALL**this is important as we prioritize the call backs  YOU WILL RECEIVE A CALL BACK THE SAME DAY AS LONG AS YOU CALL BEFORE 4:00 PM

## 2024-01-20 ENCOUNTER — Other Ambulatory Visit (HOSPITAL_COMMUNITY): Payer: Self-pay | Admitting: Cardiology

## 2024-02-13 ENCOUNTER — Ambulatory Visit (HOSPITAL_COMMUNITY)
Admission: RE | Admit: 2024-02-13 | Discharge: 2024-02-13 | Disposition: A | Discharge status: Home / Self Care | Source: Ambulatory Visit | Attending: Cardiology | Admitting: Cardiology

## 2024-02-13 VITALS — BP 153/54 | HR 61 | Temp 97.6°F

## 2024-02-13 DIAGNOSIS — E782 Mixed hyperlipidemia: Secondary | ICD-10-CM | POA: Insufficient documentation

## 2024-02-13 MED ORDER — INCLISIRAN SODIUM 284 MG/1.5ML ~~LOC~~ SOSY
PREFILLED_SYRINGE | SUBCUTANEOUS | Status: AC
  Filled 2024-02-13: qty 1.5

## 2024-02-13 MED ORDER — INCLISIRAN SODIUM 284 MG/1.5ML ~~LOC~~ SOSY
284.0000 mg | PREFILLED_SYRINGE | Freq: Once | SUBCUTANEOUS | Status: AC
  Administered 2024-02-13: 284 mg via SUBCUTANEOUS

## 2024-03-23 ENCOUNTER — Ambulatory Visit: Admitting: Adult Health

## 2024-08-13 ENCOUNTER — Encounter (HOSPITAL_COMMUNITY)
